# Patient Record
Sex: Female | Born: 1965
Health system: Southern US, Community
[De-identification: ages and names within clinical notes are randomized; demographics above are authoritative.]

## PROBLEM LIST (undated history)

## (undated) DIAGNOSIS — T8859XA Other complications of anesthesia, initial encounter: Secondary | ICD-10-CM

## (undated) DIAGNOSIS — E119 Type 2 diabetes mellitus without complications: Secondary | ICD-10-CM

## (undated) DIAGNOSIS — K219 Gastro-esophageal reflux disease without esophagitis: Secondary | ICD-10-CM

## (undated) DIAGNOSIS — E785 Hyperlipidemia, unspecified: Secondary | ICD-10-CM

## (undated) DIAGNOSIS — I10 Essential (primary) hypertension: Secondary | ICD-10-CM

## (undated) DIAGNOSIS — K3184 Gastroparesis: Secondary | ICD-10-CM

## (undated) DIAGNOSIS — E1010 Type 1 diabetes mellitus with ketoacidosis without coma: Secondary | ICD-10-CM

## (undated) DIAGNOSIS — R112 Nausea with vomiting, unspecified: Secondary | ICD-10-CM

## (undated) DIAGNOSIS — N179 Acute kidney failure, unspecified: Secondary | ICD-10-CM

## (undated) DIAGNOSIS — E1040 Type 1 diabetes mellitus with diabetic neuropathy, unspecified: Secondary | ICD-10-CM

## (undated) DIAGNOSIS — E1110 Type 2 diabetes mellitus with ketoacidosis without coma: Secondary | ICD-10-CM

## (undated) DIAGNOSIS — E538 Deficiency of other specified B group vitamins: Secondary | ICD-10-CM

## (undated) DIAGNOSIS — F419 Anxiety disorder, unspecified: Secondary | ICD-10-CM

## (undated) DIAGNOSIS — D509 Iron deficiency anemia, unspecified: Secondary | ICD-10-CM

## (undated) DIAGNOSIS — F322 Major depressive disorder, single episode, severe without psychotic features: Secondary | ICD-10-CM

## (undated) DIAGNOSIS — Z9889 Other specified postprocedural states: Secondary | ICD-10-CM

## (undated) DIAGNOSIS — G2401 Drug induced subacute dyskinesia: Secondary | ICD-10-CM

## (undated) DIAGNOSIS — I214 Non-ST elevation (NSTEMI) myocardial infarction: Secondary | ICD-10-CM

## (undated) DIAGNOSIS — E1143 Type 2 diabetes mellitus with diabetic autonomic (poly)neuropathy: Secondary | ICD-10-CM

## (undated) DIAGNOSIS — F329 Major depressive disorder, single episode, unspecified: Secondary | ICD-10-CM

## (undated) DIAGNOSIS — F32A Depression, unspecified: Secondary | ICD-10-CM

## (undated) DIAGNOSIS — I2510 Atherosclerotic heart disease of native coronary artery without angina pectoris: Secondary | ICD-10-CM

## (undated) DIAGNOSIS — Z951 Presence of aortocoronary bypass graft: Secondary | ICD-10-CM

## (undated) HISTORY — DX: Drug induced subacute dyskinesia: G24.01

## (undated) HISTORY — DX: Gastroparesis: K31.84

## (undated) HISTORY — PX: RETINAL DETACHMENT SURGERY: SHX105

## (undated) HISTORY — DX: Type 1 diabetes mellitus with ketoacidosis without coma: E10.10

## (undated) HISTORY — DX: Type 2 diabetes mellitus with diabetic autonomic (poly)neuropathy: E11.43

## (undated) HISTORY — PX: CARDIAC SURGERY: SHX584

## (undated) HISTORY — DX: Type 2 diabetes mellitus with ketoacidosis without coma: E11.10

## (undated) HISTORY — PX: EYE SURGERY: SHX253

## (undated) HISTORY — DX: Non-ST elevation (NSTEMI) myocardial infarction: I21.4

## (undated) HISTORY — DX: Major depressive disorder, single episode, severe without psychotic features: F32.2

## (undated) HISTORY — DX: Type 1 diabetes mellitus with diabetic neuropathy, unspecified: E10.40

## (undated) HISTORY — DX: Atherosclerotic heart disease of native coronary artery without angina pectoris: I25.10

## (undated) HISTORY — DX: Presence of aortocoronary bypass graft: Z95.1

## (undated) HISTORY — DX: Iron deficiency anemia, unspecified: D50.9

## (undated) HISTORY — DX: Acute kidney failure, unspecified: N17.9

## (undated) HISTORY — DX: Deficiency of other specified B group vitamins: E53.8

## (undated) HISTORY — PX: OTHER SURGICAL HISTORY: SHX169

---

## 1998-01-21 ENCOUNTER — Ambulatory Visit (HOSPITAL_COMMUNITY): Admission: RE | Admit: 1998-01-21 | Discharge: 1998-01-21 | Payer: Self-pay | Admitting: Obstetrics and Gynecology

## 1998-07-27 ENCOUNTER — Other Ambulatory Visit: Admission: RE | Admit: 1998-07-27 | Discharge: 1998-07-27 | Payer: Self-pay | Admitting: Obstetrics and Gynecology

## 1998-12-04 ENCOUNTER — Emergency Department (HOSPITAL_COMMUNITY): Admission: EM | Admit: 1998-12-04 | Discharge: 1998-12-04 | Payer: Self-pay | Admitting: Emergency Medicine

## 1999-05-08 ENCOUNTER — Other Ambulatory Visit: Admission: RE | Admit: 1999-05-08 | Discharge: 1999-05-08 | Payer: Self-pay | Admitting: Obstetrics and Gynecology

## 2000-01-08 ENCOUNTER — Encounter: Admission: RE | Admit: 2000-01-08 | Discharge: 2000-04-07 | Payer: Self-pay | Admitting: Internal Medicine

## 2000-04-01 ENCOUNTER — Other Ambulatory Visit: Admission: RE | Admit: 2000-04-01 | Discharge: 2000-04-01 | Payer: Self-pay | Admitting: Obstetrics and Gynecology

## 2000-05-13 ENCOUNTER — Encounter: Admission: RE | Admit: 2000-05-13 | Discharge: 2000-08-11 | Payer: Self-pay | Admitting: Internal Medicine

## 2001-03-26 ENCOUNTER — Encounter: Admission: RE | Admit: 2001-03-26 | Discharge: 2001-06-24 | Payer: Self-pay | Admitting: Internal Medicine

## 2001-08-13 ENCOUNTER — Other Ambulatory Visit: Admission: RE | Admit: 2001-08-13 | Discharge: 2001-08-13 | Payer: Self-pay | Admitting: Obstetrics and Gynecology

## 2001-08-19 ENCOUNTER — Encounter: Payer: Self-pay | Admitting: Obstetrics and Gynecology

## 2001-08-19 ENCOUNTER — Ambulatory Visit (HOSPITAL_COMMUNITY): Admission: RE | Admit: 2001-08-19 | Discharge: 2001-08-19 | Payer: Self-pay | Admitting: Obstetrics and Gynecology

## 2002-03-16 ENCOUNTER — Encounter: Payer: Self-pay | Admitting: Ophthalmology

## 2002-03-16 ENCOUNTER — Ambulatory Visit (HOSPITAL_COMMUNITY): Admission: RE | Admit: 2002-03-16 | Discharge: 2002-03-17 | Payer: Self-pay | Admitting: Ophthalmology

## 2002-03-16 HISTORY — PX: OTHER SURGICAL HISTORY: SHX169

## 2002-09-28 ENCOUNTER — Ambulatory Visit (HOSPITAL_COMMUNITY): Admission: RE | Admit: 2002-09-28 | Discharge: 2002-09-29 | Payer: Self-pay | Admitting: Ophthalmology

## 2002-09-28 HISTORY — PX: OTHER SURGICAL HISTORY: SHX169

## 2003-01-25 ENCOUNTER — Other Ambulatory Visit: Admission: RE | Admit: 2003-01-25 | Discharge: 2003-01-25 | Payer: Self-pay | Admitting: Obstetrics and Gynecology

## 2003-04-05 ENCOUNTER — Inpatient Hospital Stay (HOSPITAL_COMMUNITY): Admission: AD | Admit: 2003-04-05 | Discharge: 2003-04-08 | Payer: Self-pay | Admitting: Internal Medicine

## 2003-04-06 ENCOUNTER — Encounter: Payer: Self-pay | Admitting: Internal Medicine

## 2003-07-16 ENCOUNTER — Inpatient Hospital Stay (HOSPITAL_COMMUNITY): Admission: EM | Admit: 2003-07-16 | Discharge: 2003-07-19 | Payer: Self-pay

## 2004-01-21 IMAGING — CT CT ABDOMEN W/ CM
1 series · 1 of 1 positions shown · IV contrast (omnipaque)
Comparison: none

FINDINGS
CLINICAL DATA: LOWER ABDOMINAL PELVIC PAIN.  DIABETES AND URINARY TRACT INFECTION.  NAUSEA AND
VOMITING.   SUSPECT APPENDICITIS.
TECHNIQUE
MULTIDETECTOR HELICAL CT OF THE ABDOMEN AND PELVIS WAS PERFORMED DURING ADMINISTRATION OF 100 CC OF
OMNIPAQUE 300 INTRAVENOUS CONTRAST.  THE PATIENT WAS ONLY ABLE TO TOLERATE A SMALL AMOUNT OF ORAL
CONTRAST.  A RECTAL CONTRAST WAS ALSO ADMINISTERED.
ABDOMEN CT WITH CONTRAST
SURGICAL CLIPS ARE SEEN FROM PRIOR CHOLECYSTECTOMY.  THE LIVER, SPLEEN, PANCREAS, ADRENAL GLANDS
AND KIDNEYS ARE NORMAL IN APPEARANCE.
THERE IS NO EVIDENCE OF DILATED BOWEL LOOPS.  THERE IS NO EVIDENCE OF MASSES OR INFLAMMATORY
PROCESS.  THERE IS NO EVIDENCE OF ASCITES.
IMPRESSION
NEGATIVE ABDOMEN CT.
PELVIS CT WITH CONTRAST
RECTAL CONTRAST FILLS THE COLON  AND THE APPENDIX IS SEEN WHICH IS FILLED WITH AIR AND CONTRAST.
THERE IS NO EVIDENCE OF APPENDICITIS.  NO OTHER INFLAMMATORY PROCESS IS SEEN WITHIN THE PELVIS.
NO PELVIC MASSES ARE IDENTIFIED. THERE IS NO EVIDENCE OF FREE FLUID.  THE UTERINE FUNDUS HAS A
LOBULATED CONTOUR AND THE POSSIBILITY OF A FUNDAL UTERINE FIBROID CANNOT BE EXCLUDED.
NO EVIDENCE OF APPENDICITIS OR OTHER ACUTE INFLAMMATORY PROCESS.
QUESTION UTERINE FIBROID.  PELVIC ULTRSOUND COULD BE PERFORMED FOR FURTHER EVALUATION IF INDICATED.

[Series 1: — · 0.17mm/px · 1 of 1 slices shown]
[im 1/1  soft-tissue]
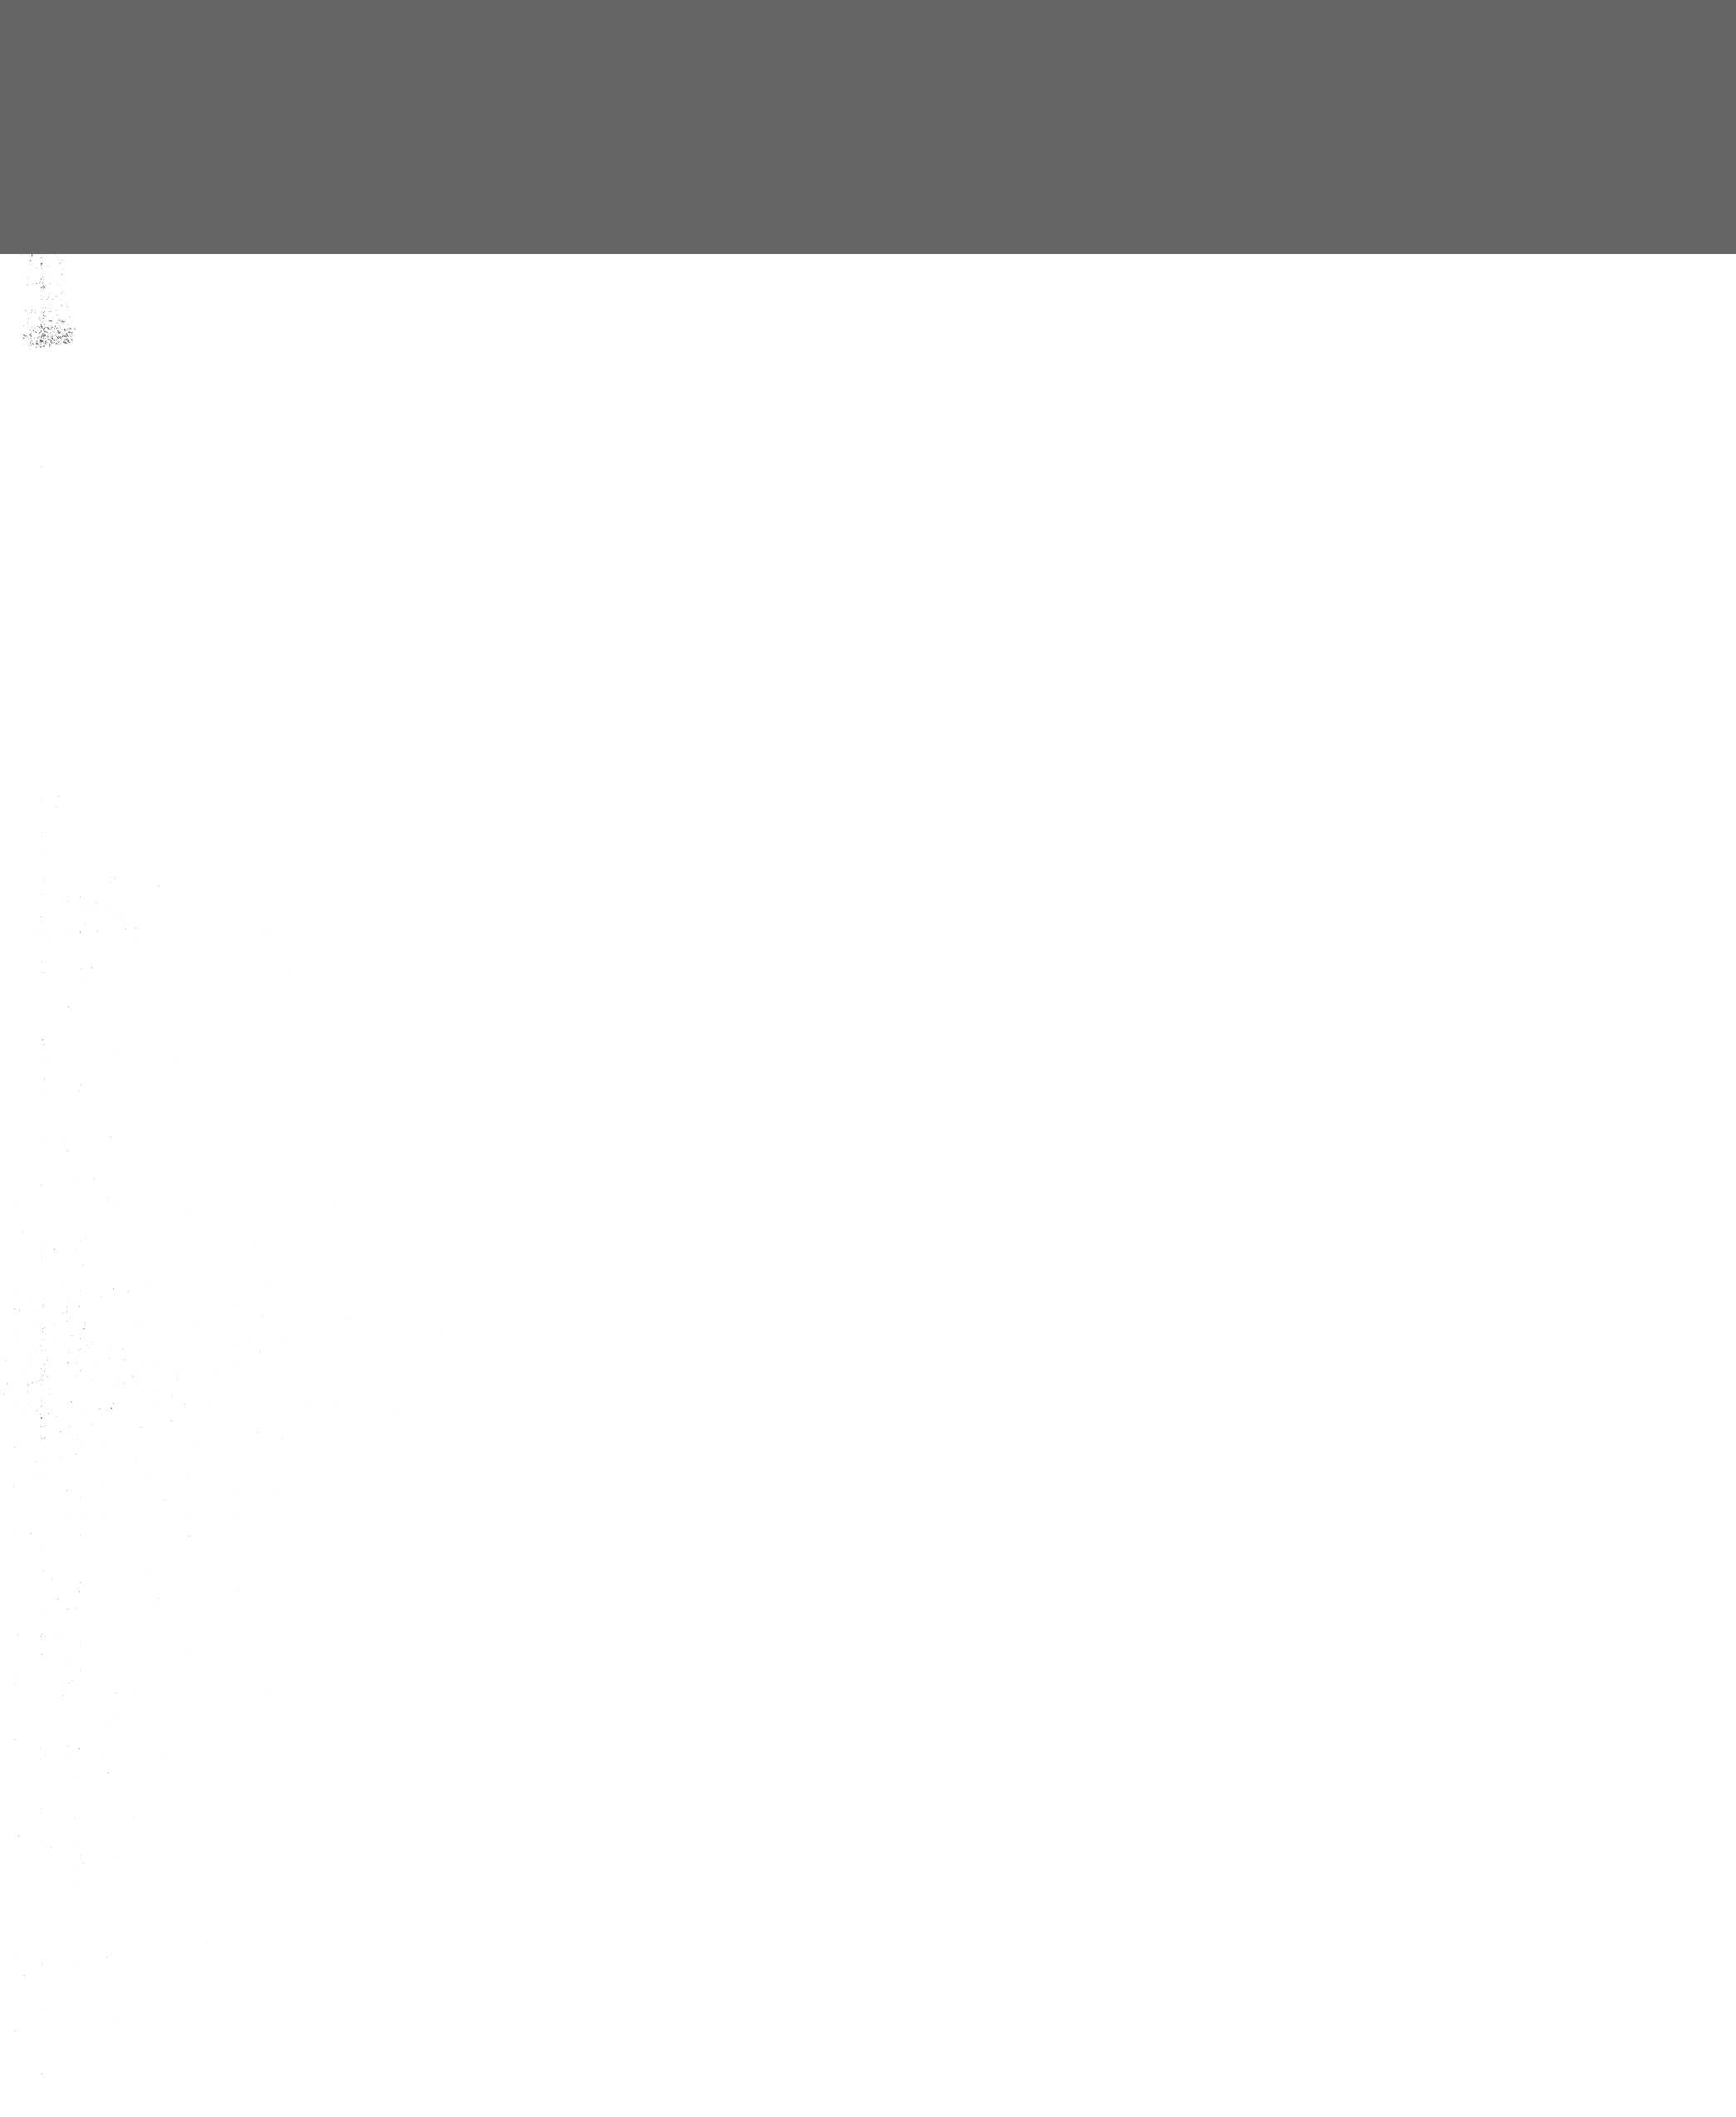

[1 of 1 positions shown; findings below may reference images not displayed]

## 2004-05-23 ENCOUNTER — Ambulatory Visit: Payer: Self-pay | Admitting: Internal Medicine

## 2004-06-05 ENCOUNTER — Ambulatory Visit: Payer: Self-pay | Admitting: Family Medicine

## 2004-06-06 ENCOUNTER — Ambulatory Visit: Payer: Self-pay | Admitting: *Deleted

## 2004-06-27 ENCOUNTER — Ambulatory Visit: Payer: Self-pay | Admitting: Family Medicine

## 2004-08-02 ENCOUNTER — Inpatient Hospital Stay (HOSPITAL_COMMUNITY): Admission: EM | Admit: 2004-08-02 | Discharge: 2004-08-05 | Payer: Self-pay | Admitting: *Deleted

## 2005-02-22 ENCOUNTER — Inpatient Hospital Stay (HOSPITAL_COMMUNITY): Admission: EM | Admit: 2005-02-22 | Discharge: 2005-02-27 | Payer: Self-pay | Admitting: Emergency Medicine

## 2005-05-19 IMAGING — CR DG ABDOMEN ACUTE W/ 1V CHEST
3 series · 3 of 3 positions shown · non-contrast
Comparison: None.

CLINICAL DATA: Mid abdominal pain with nausea and vomiting for two days.
 ACUTE ABDOMINAL SERIES WITH CHEST:

[view not recorded (1 of 3)]
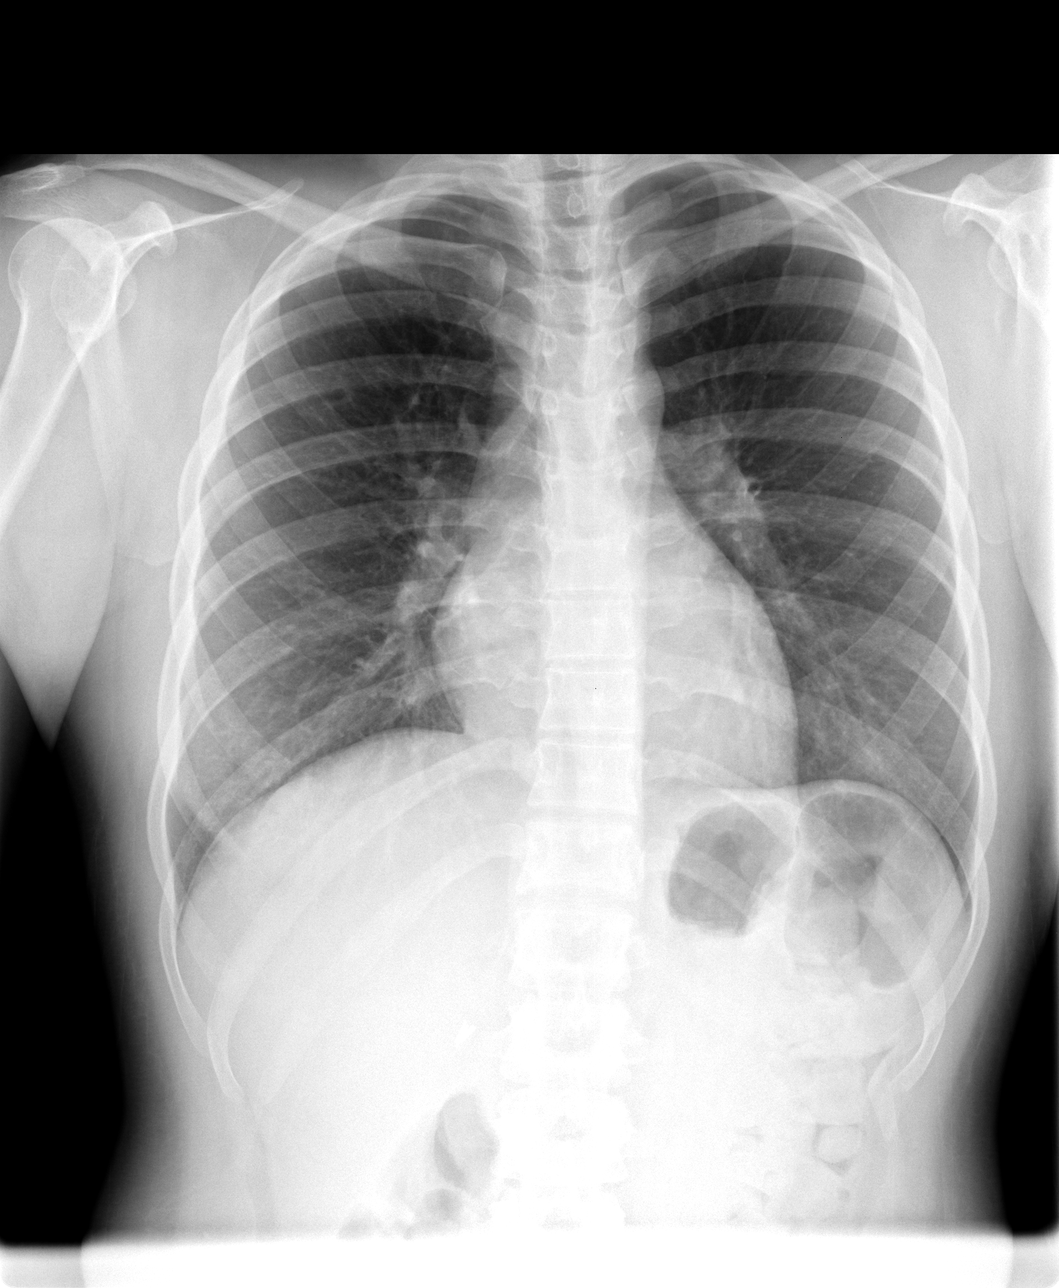

[view not recorded (2 of 3)]
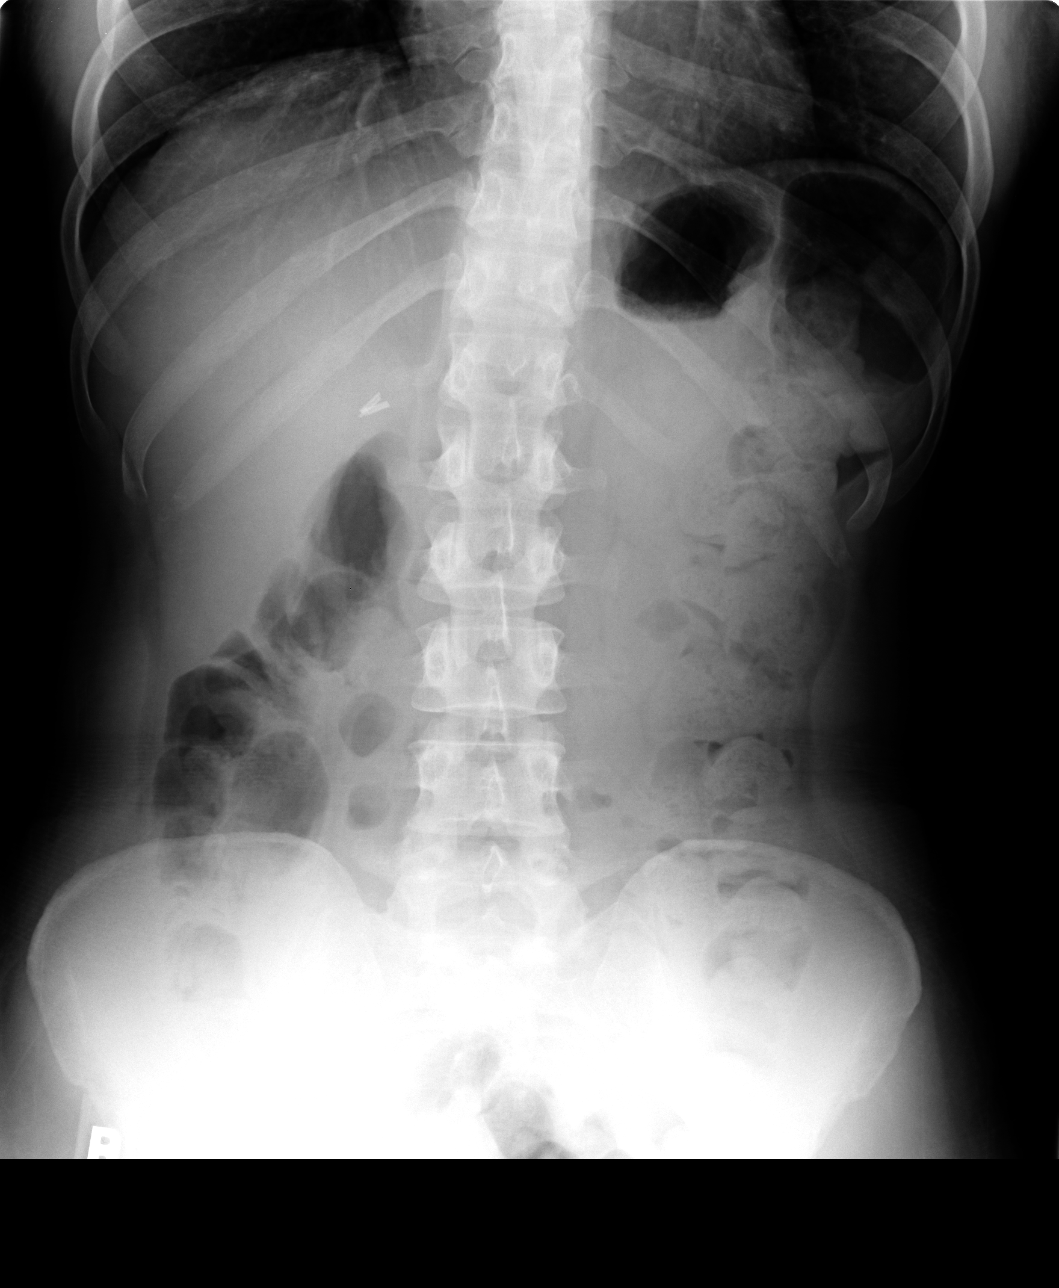

[view not recorded (3 of 3)]
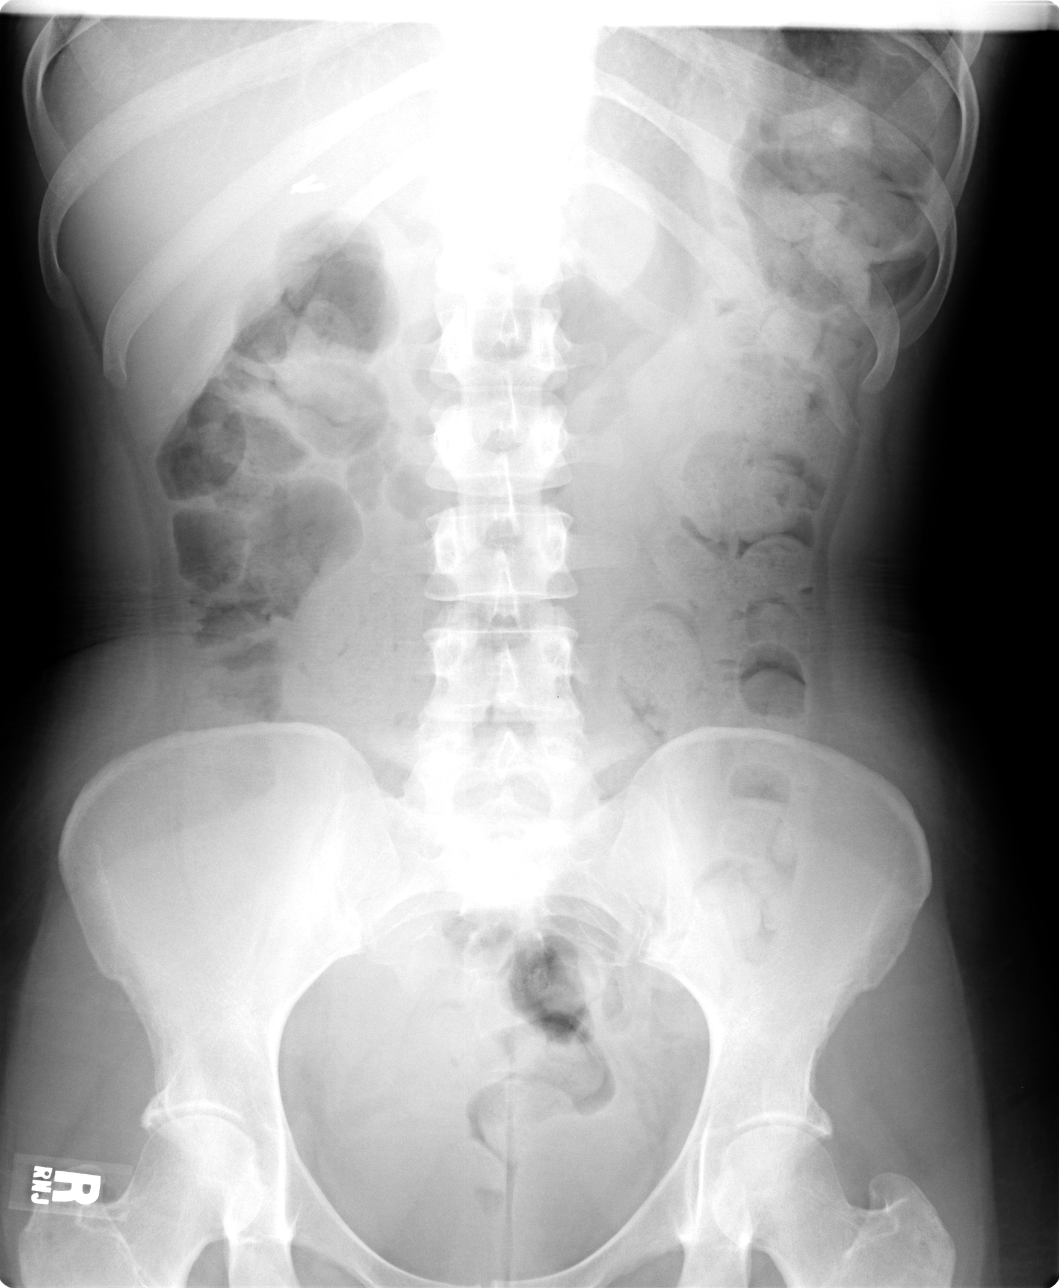

[3 of 3 positions shown; findings below may reference images not displayed]

PA chest demonstrates normal cardiomediastinal contours and clear lungs.  There is no pleural effusion or pneumoperitoneum. 
 Supine and erect views of the abdomen demonstrate a moderate amount of stool throughout the colon.  The bowel gas pattern is nonobstructive, and there is no free intraperitoneal air.  No suspicious abdominal calcifications are present.  There are right upper quadrant abdominal surgical clips compatible with previously cholecystectomy.
IMPRESSION: No active cardiopulmonary or abdominal process demonstrated.  Possible mild constipation.

## 2005-12-09 IMAGING — CR DG CHEST 1V PORT
1 series · 1 of 1 positions shown · non-contrast
Comparison: None.

CLINICAL DATA: Diabetic ketoacidosis.  PICC line placement. 
 PORTABLE SINGLE VIEW CHEST ([DATE] HOURS):

[view not recorded]
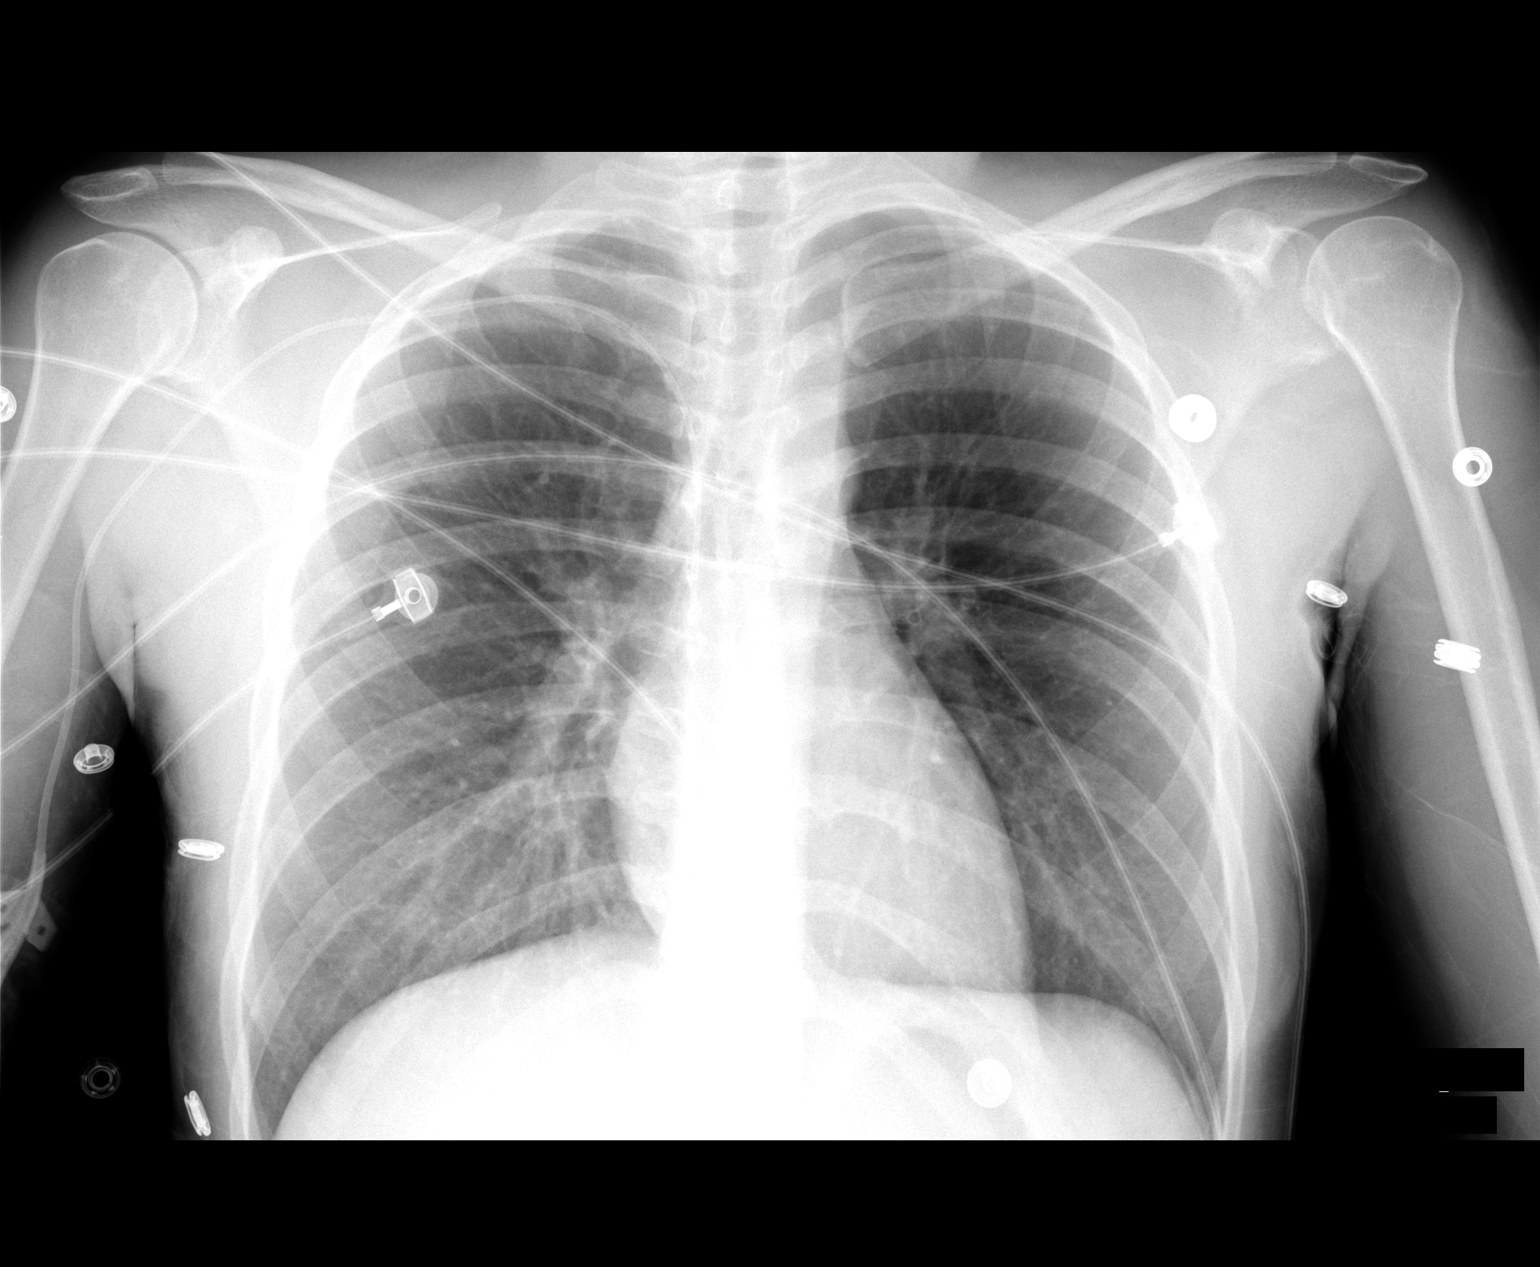

[1 of 1 positions shown; findings below may reference images not displayed]

FINDINGS: Right upper extremity PICC line has been placed.  The catheter tip lies in the lower SVC.  Lungs are clear.
IMPRESSION: PICC line tip lies in the lower SVC.

## 2005-12-10 IMAGING — US US ABDOMEN PORT
1 series · 14 of 25 positions shown · non-contrast
Comparison: None.

CLINICAL DATA: Diabetic he was stenosis. Prior cholecystectomy. History
diabetes. Abdominal pain.

COMPLETE ABDOMINAL ULTRASOUND  02/23/2005:

[Series 1: unknown · 0.30mm/px · 14 of 73 slices shown]
[im 1/73]
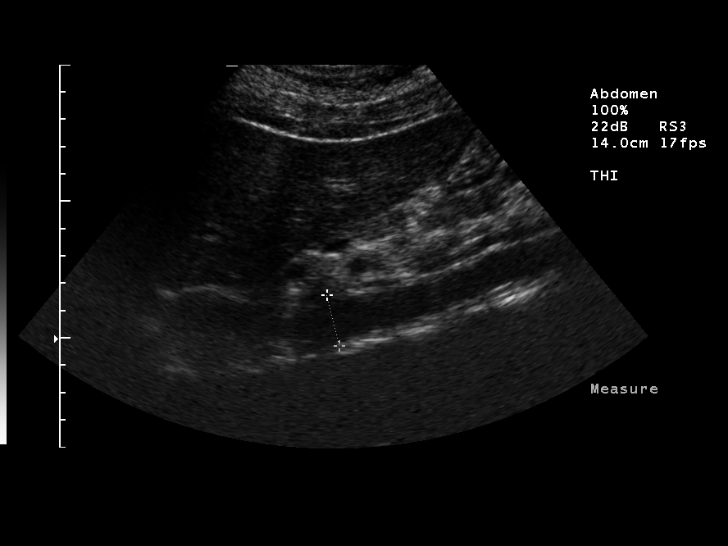
[im 7/73]
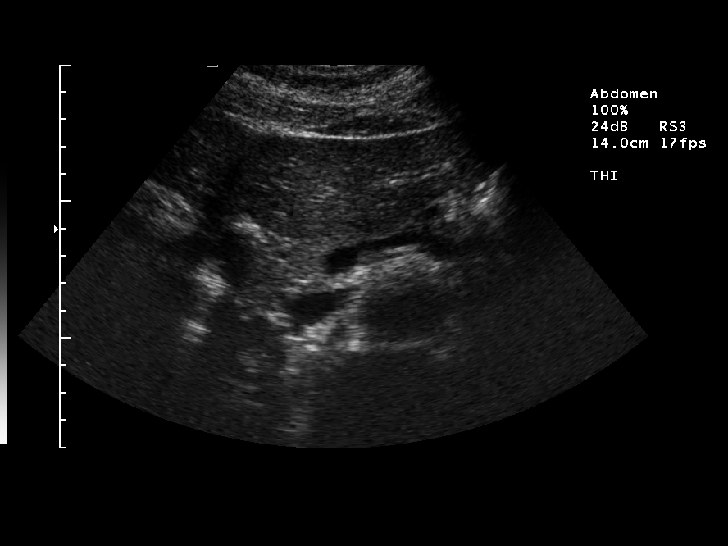
[im 13/73]
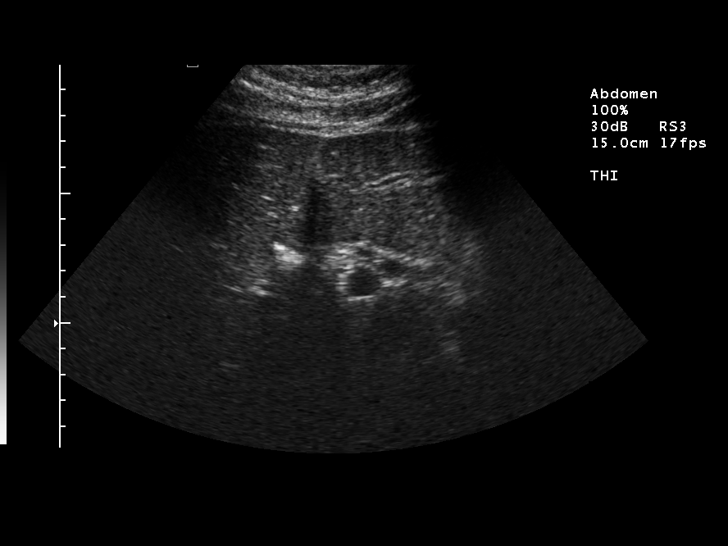
[im 19/73]
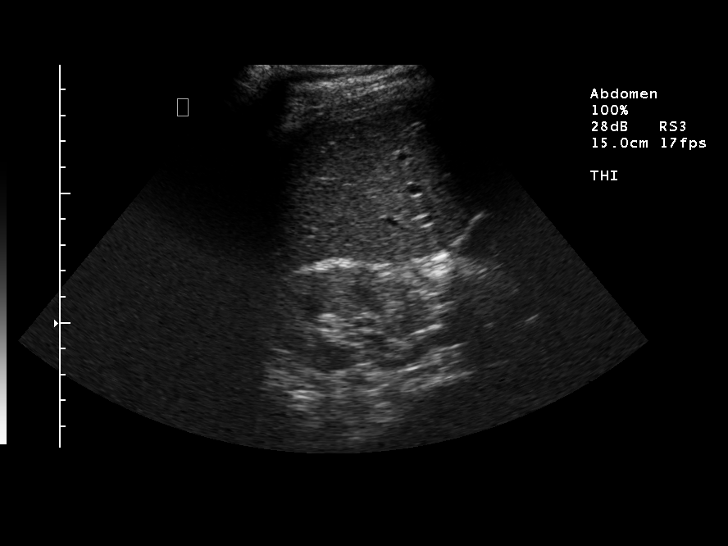
[im 25/73]
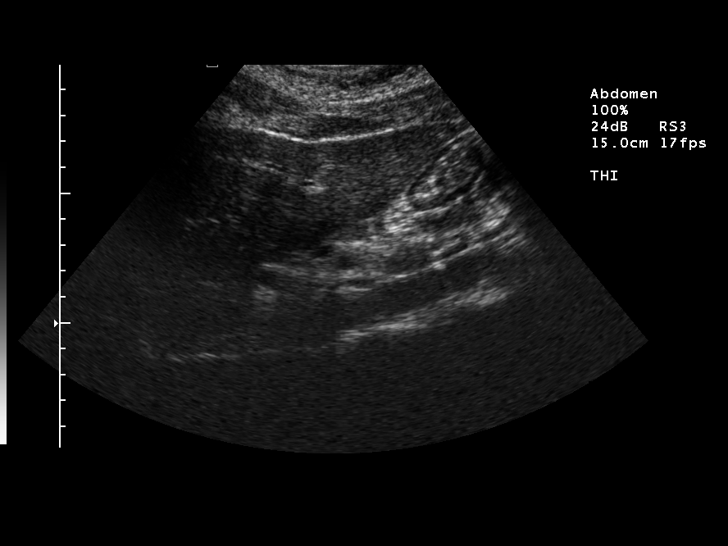
[im 28/73]
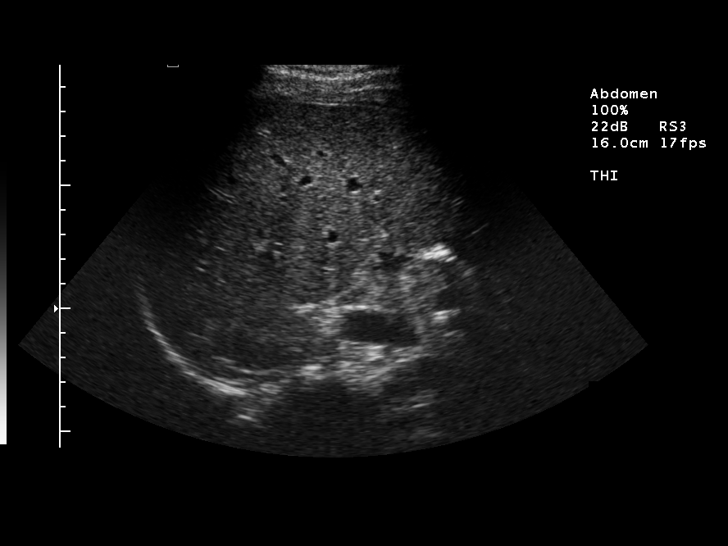
[im 34/73]
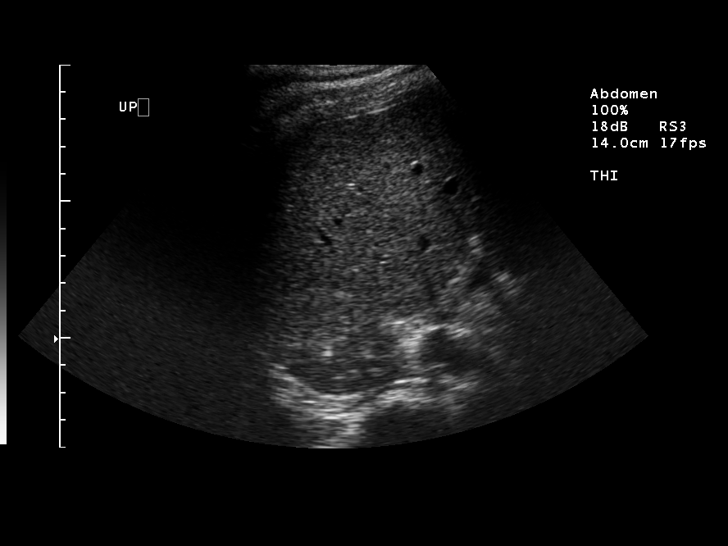
[im 40/73]
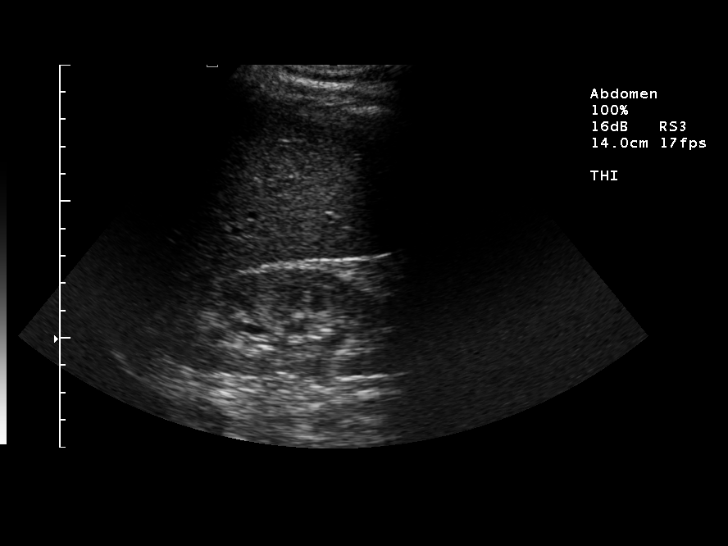
[im 46/73]
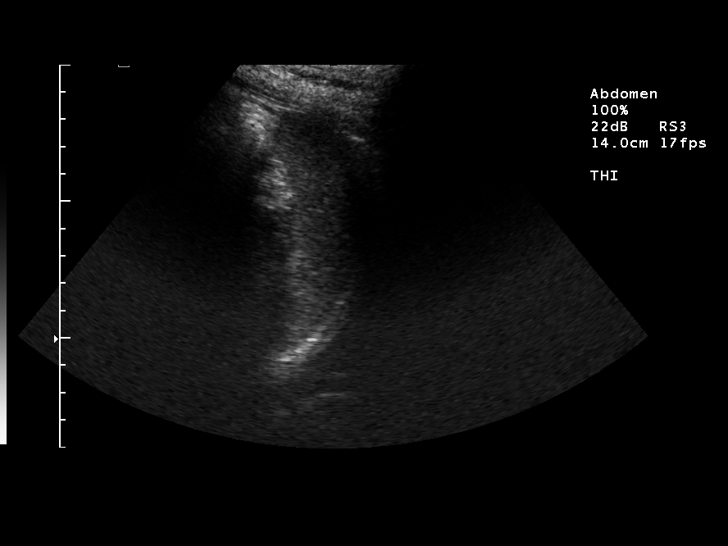
[im 49/73]
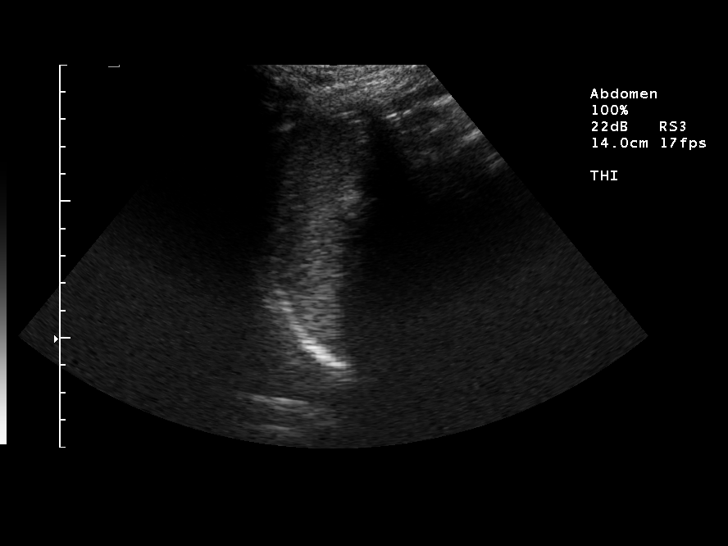
[im 55/73]
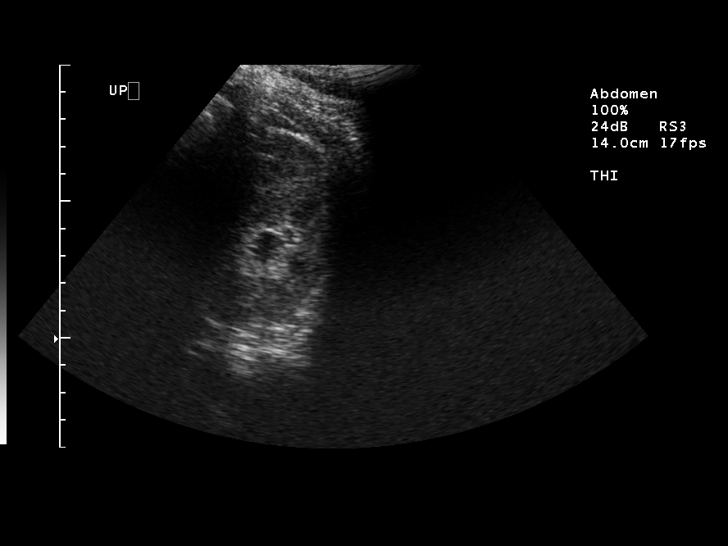
[im 61/73]
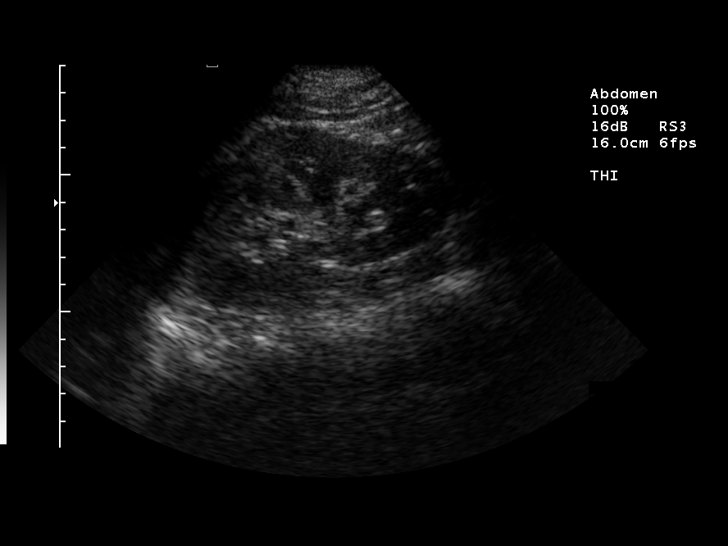
[im 67/73]
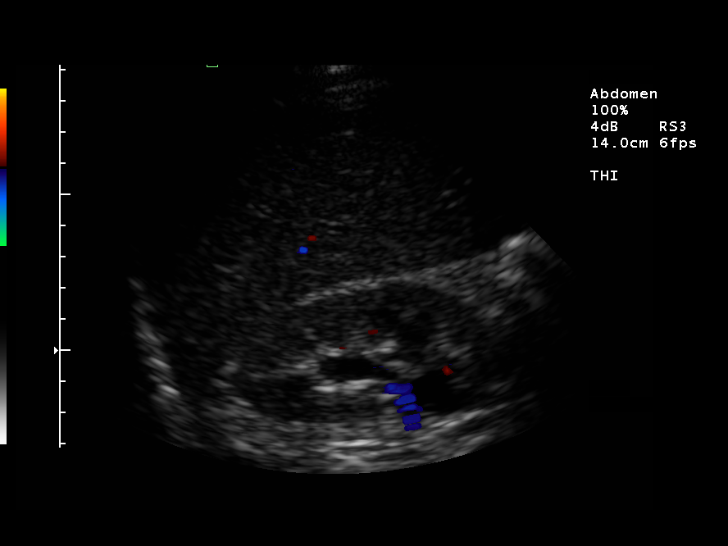
[im 73/73]
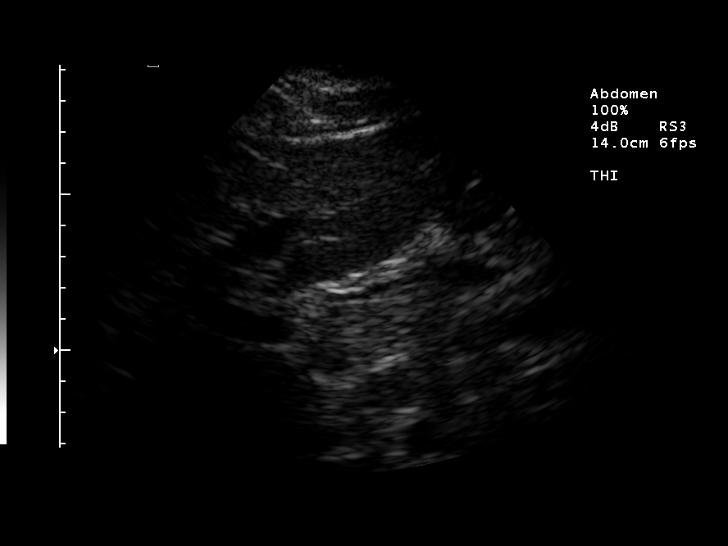

[14 of 25 positions shown; findings below may reference images not displayed]

FINDINGS: The examination was performed portably at the patient's bedside.

Gallbladder:  Surgically absent.

Common bile duct: Normal, 3 mm diameter.

Liver:  Normal.

Inferior vena cava:  Patent.

Pancreas:  Normal.

Spleen:  Normal, 7.1 cm.

Right kidney:  Mild fullness of the intrarenal collecting system without overt
hydronephrosis. No focal parenchymal abnormalities. 9.9 cm length.

Left kidney:  Normal, 10.2 cm.

Abdominal aorta:  Normal, maximum diameter 1.9 cm.
IMPRESSION: 1. No biliary ductal dilation, status post cholecystectomy.
2. Mild fullness of the right renal collecting system without overt
hydronephrosis.
3. Normal abdominal ultrasound otherwise.

## 2006-02-04 ENCOUNTER — Emergency Department (HOSPITAL_COMMUNITY): Admission: EM | Admit: 2006-02-04 | Discharge: 2006-02-04 | Payer: Self-pay | Admitting: Internal Medicine

## 2006-02-05 ENCOUNTER — Inpatient Hospital Stay (HOSPITAL_COMMUNITY): Admission: AD | Admit: 2006-02-05 | Discharge: 2006-02-09 | Payer: Self-pay | Admitting: Endocrinology

## 2006-04-01 ENCOUNTER — Other Ambulatory Visit: Admission: RE | Admit: 2006-04-01 | Discharge: 2006-04-01 | Payer: Self-pay | Admitting: Obstetrics and Gynecology

## 2006-11-21 IMAGING — CT CT PELVIS W/ CM
2 of 5 series · 17 of 46 positions shown, 19 images · IV contrast (ORAL OMNI 350 & 100 ML OMNI 300)
Comparison: None.

02/05/06 ? DUPLICATE COPY for exam association in [facility] ? No change from original report.
CLINICAL DATA: Abdominal pain with nausea and vomiting.  
 ABDOMEN CT WITH CONTRAST:
TECHNIQUE: Multidetector CT imaging of the abdomen was performed following the standard protocol during bolus administration of intravenous contrast.
 Contrast:  100 cc Omnipaque 300 and oral contrast.
TECHNIQUE: Multidetector CT imaging of the pelvis was performed following the standard protocol during bolus administration of intravenous contrast.

[Series 2: routine abdomen · axial · 0.66mm/px · z∈[-384,-9]mm · 14 of 85 slices shown, 16 images]
[im 5/85  soft-tissue]
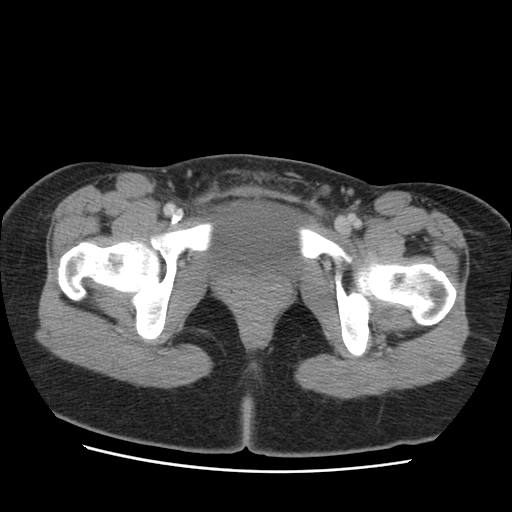
[im 5/85  bone]
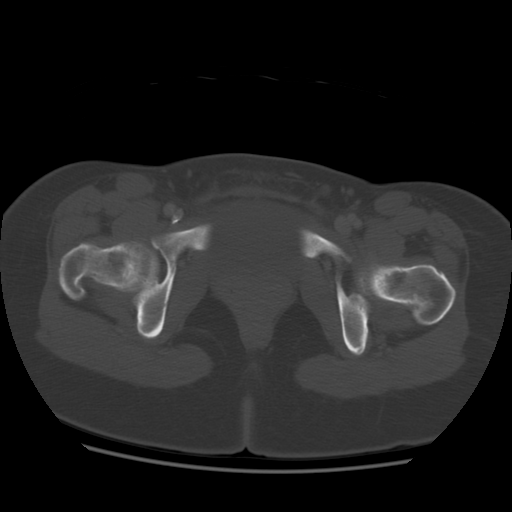
[im 13/85  soft-tissue]
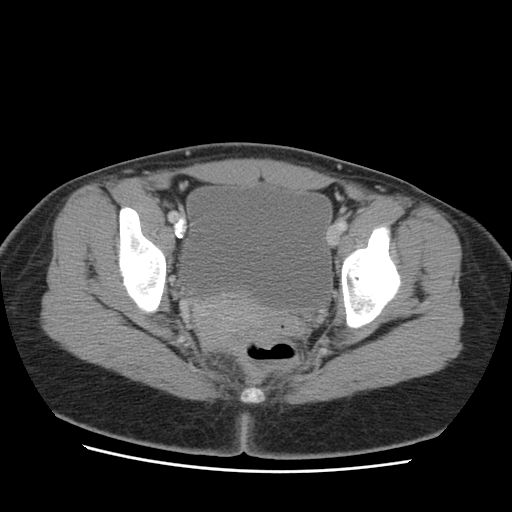
[im 17/85  soft-tissue]
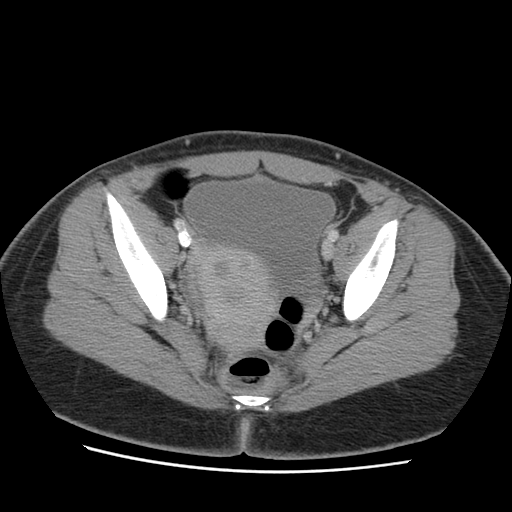
[im 22/85  soft-tissue]
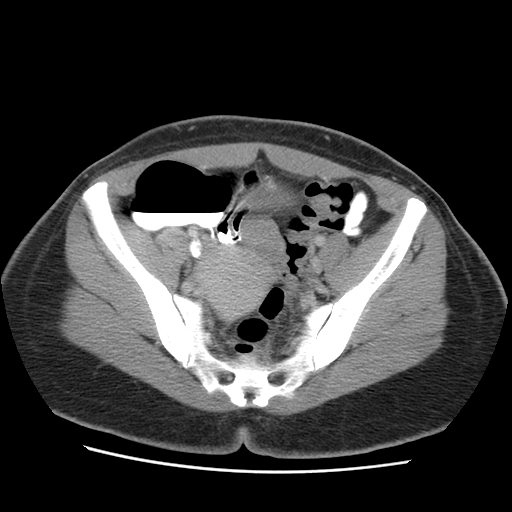
[im 30/85  soft-tissue]
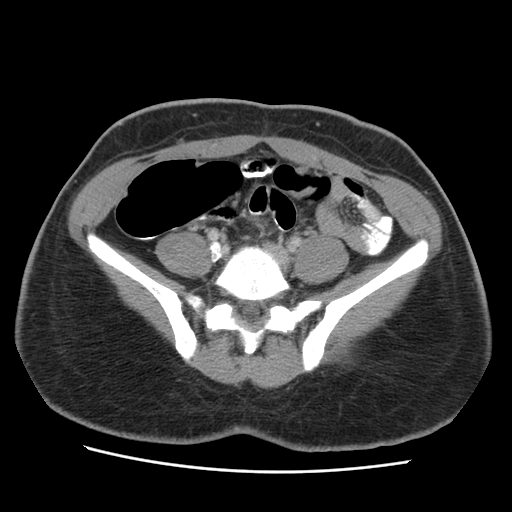
[im 34/85  soft-tissue]
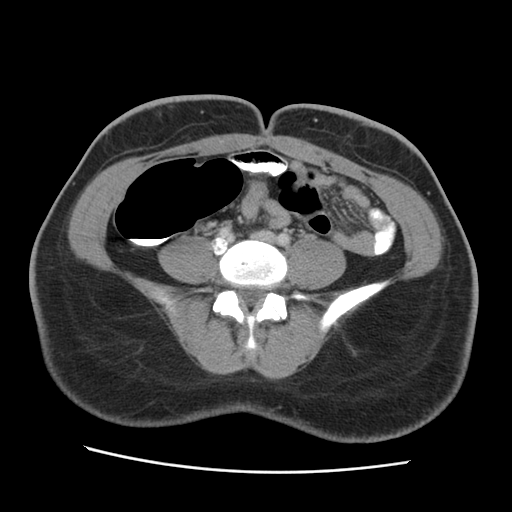
[im 38/85  soft-tissue]
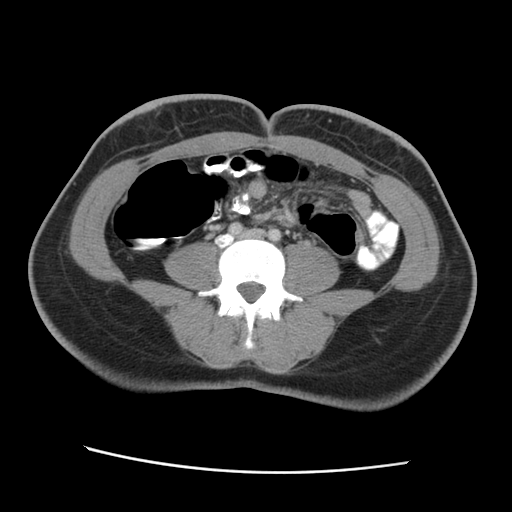
[im 47/85  soft-tissue]
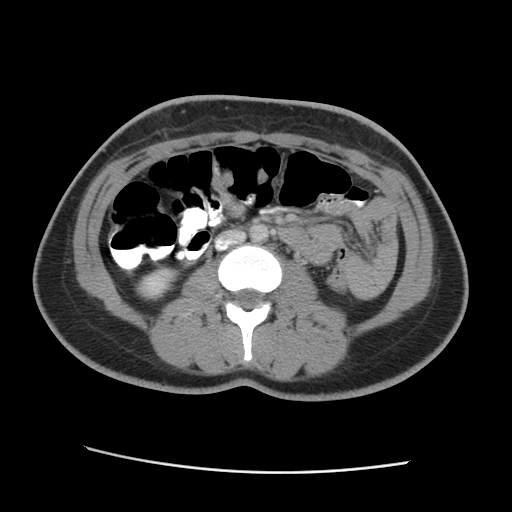
[im 51/85  soft-tissue]
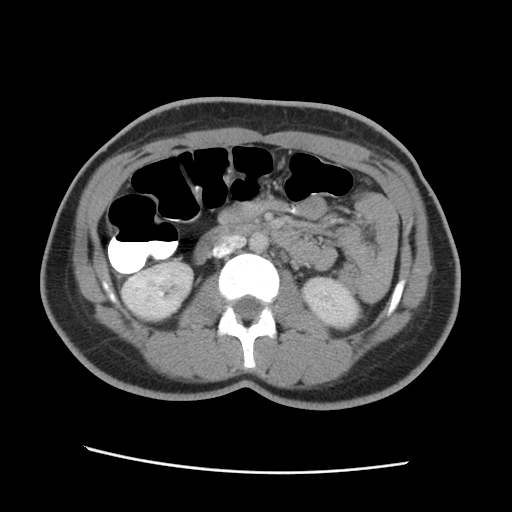
[im 51/85  bone]
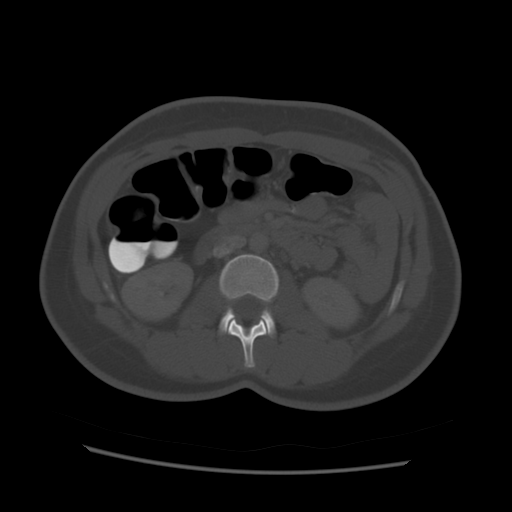
[im 55/85  soft-tissue]
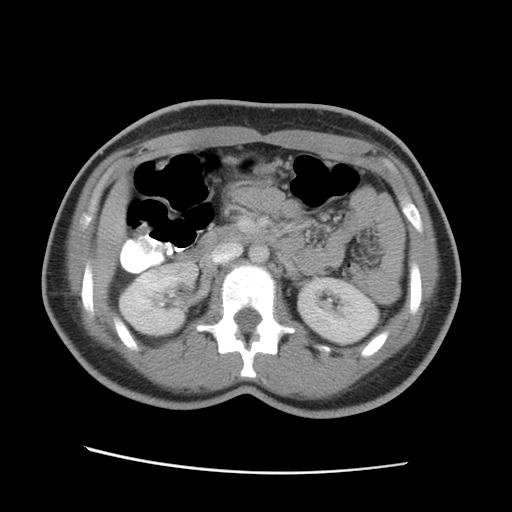
[im 64/85  soft-tissue]
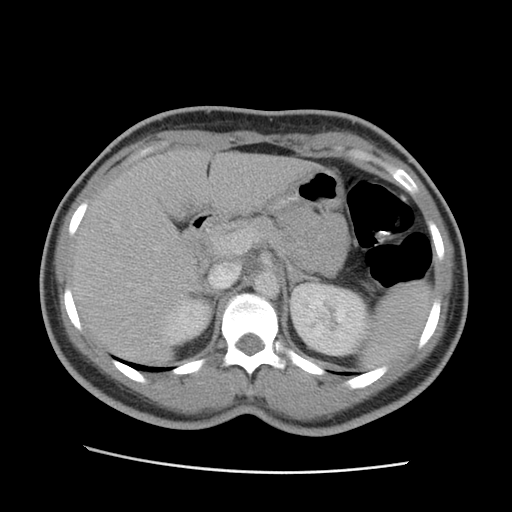
[im 68/85  soft-tissue]
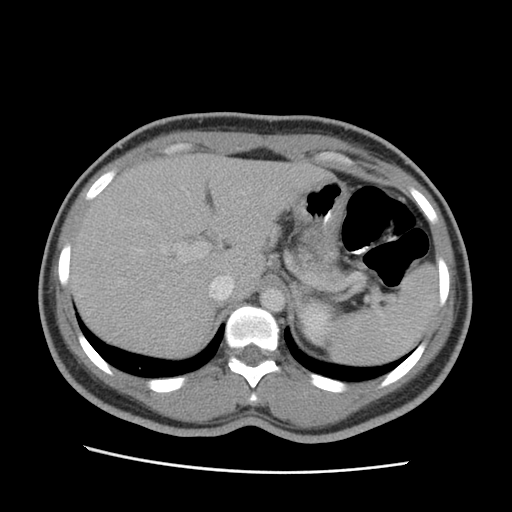
[im 72/85  soft-tissue]
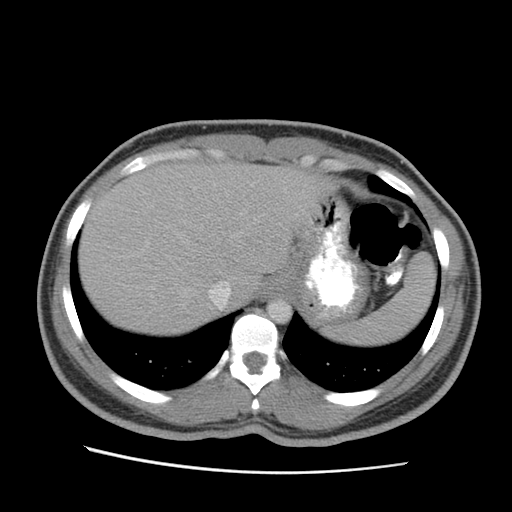
[im 80/85  soft-tissue]
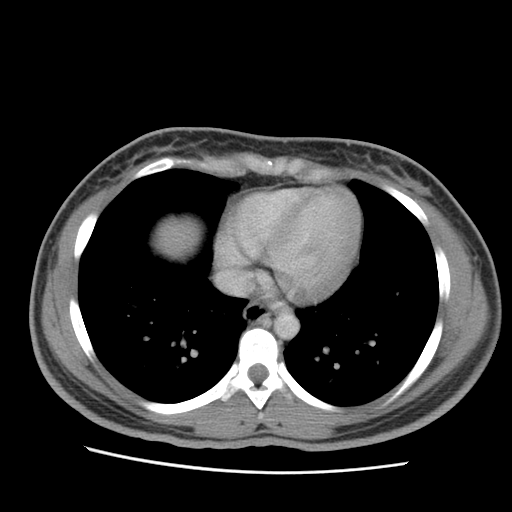

[Series 401: reformatted · coronal · 0.86mm/px · 3 of 99 slices shown]
[im 33/99  soft-tissue]
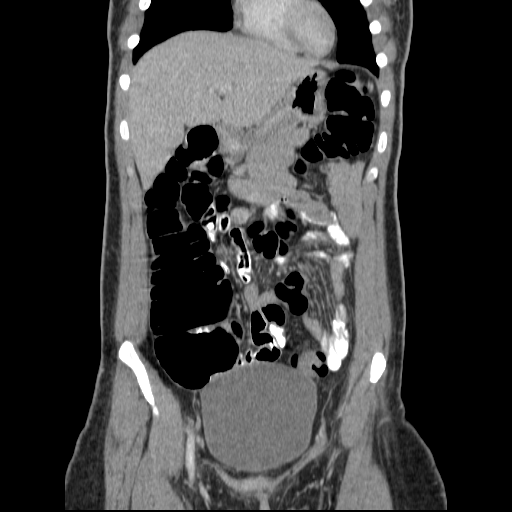
[im 44/99  soft-tissue]
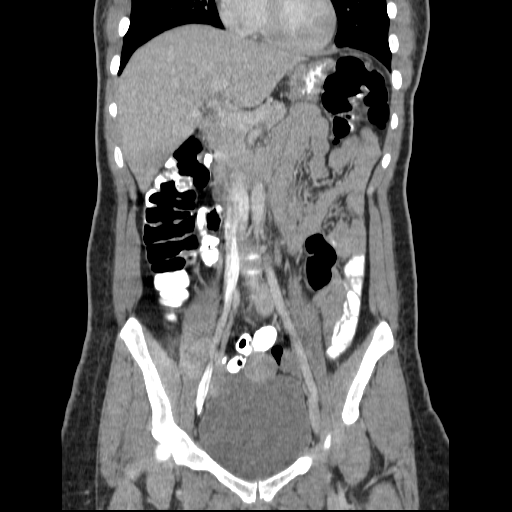
[im 55/99  soft-tissue]
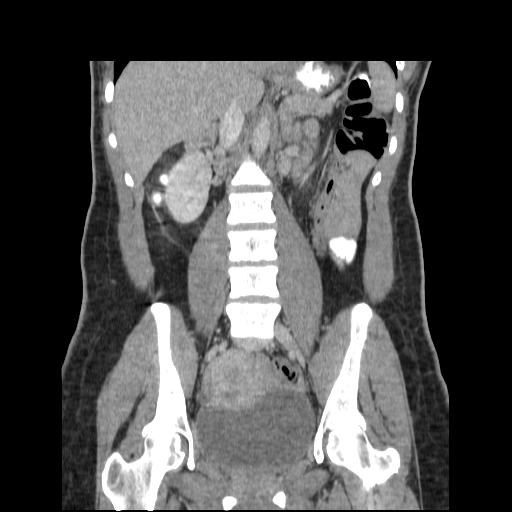

[17 of 46 positions shown; findings below may reference images not displayed]

FINDINGS: Lung bases are clear.  No focal lesions of the liver or spleen.  Status post cholecystectomy.  No bile duct dilatation.  Kidneys, pancreas, and adrenal glands normal.  No adenopathy or ascites.  
 Visualized bowel normal.
IMPRESSION: Status post cholecystectomy--No pathological findings.  
 PELVIS CT WITH CONTRAST:
FINDINGS: There is a pedunculated fibroid emanating off the anterior dome of the uterus.  No other masses, adenopathy, or fluid collections.  Appendix normal.  Visualized bowel normal.
IMPRESSION: Normal except for a pedunculated uterine fibroid.

## 2006-11-22 IMAGING — CR DG ABDOMEN 2V
2 series · 2 of 2 positions shown · non-contrast
Comparison: CT scan of 02/04/06.

CLINICAL DATA: 39 year old with abdominal pain and vomiting for three days.
 ABDOMEN ? 2 VIEW ? 02/05/06:

[w abdomen upright]
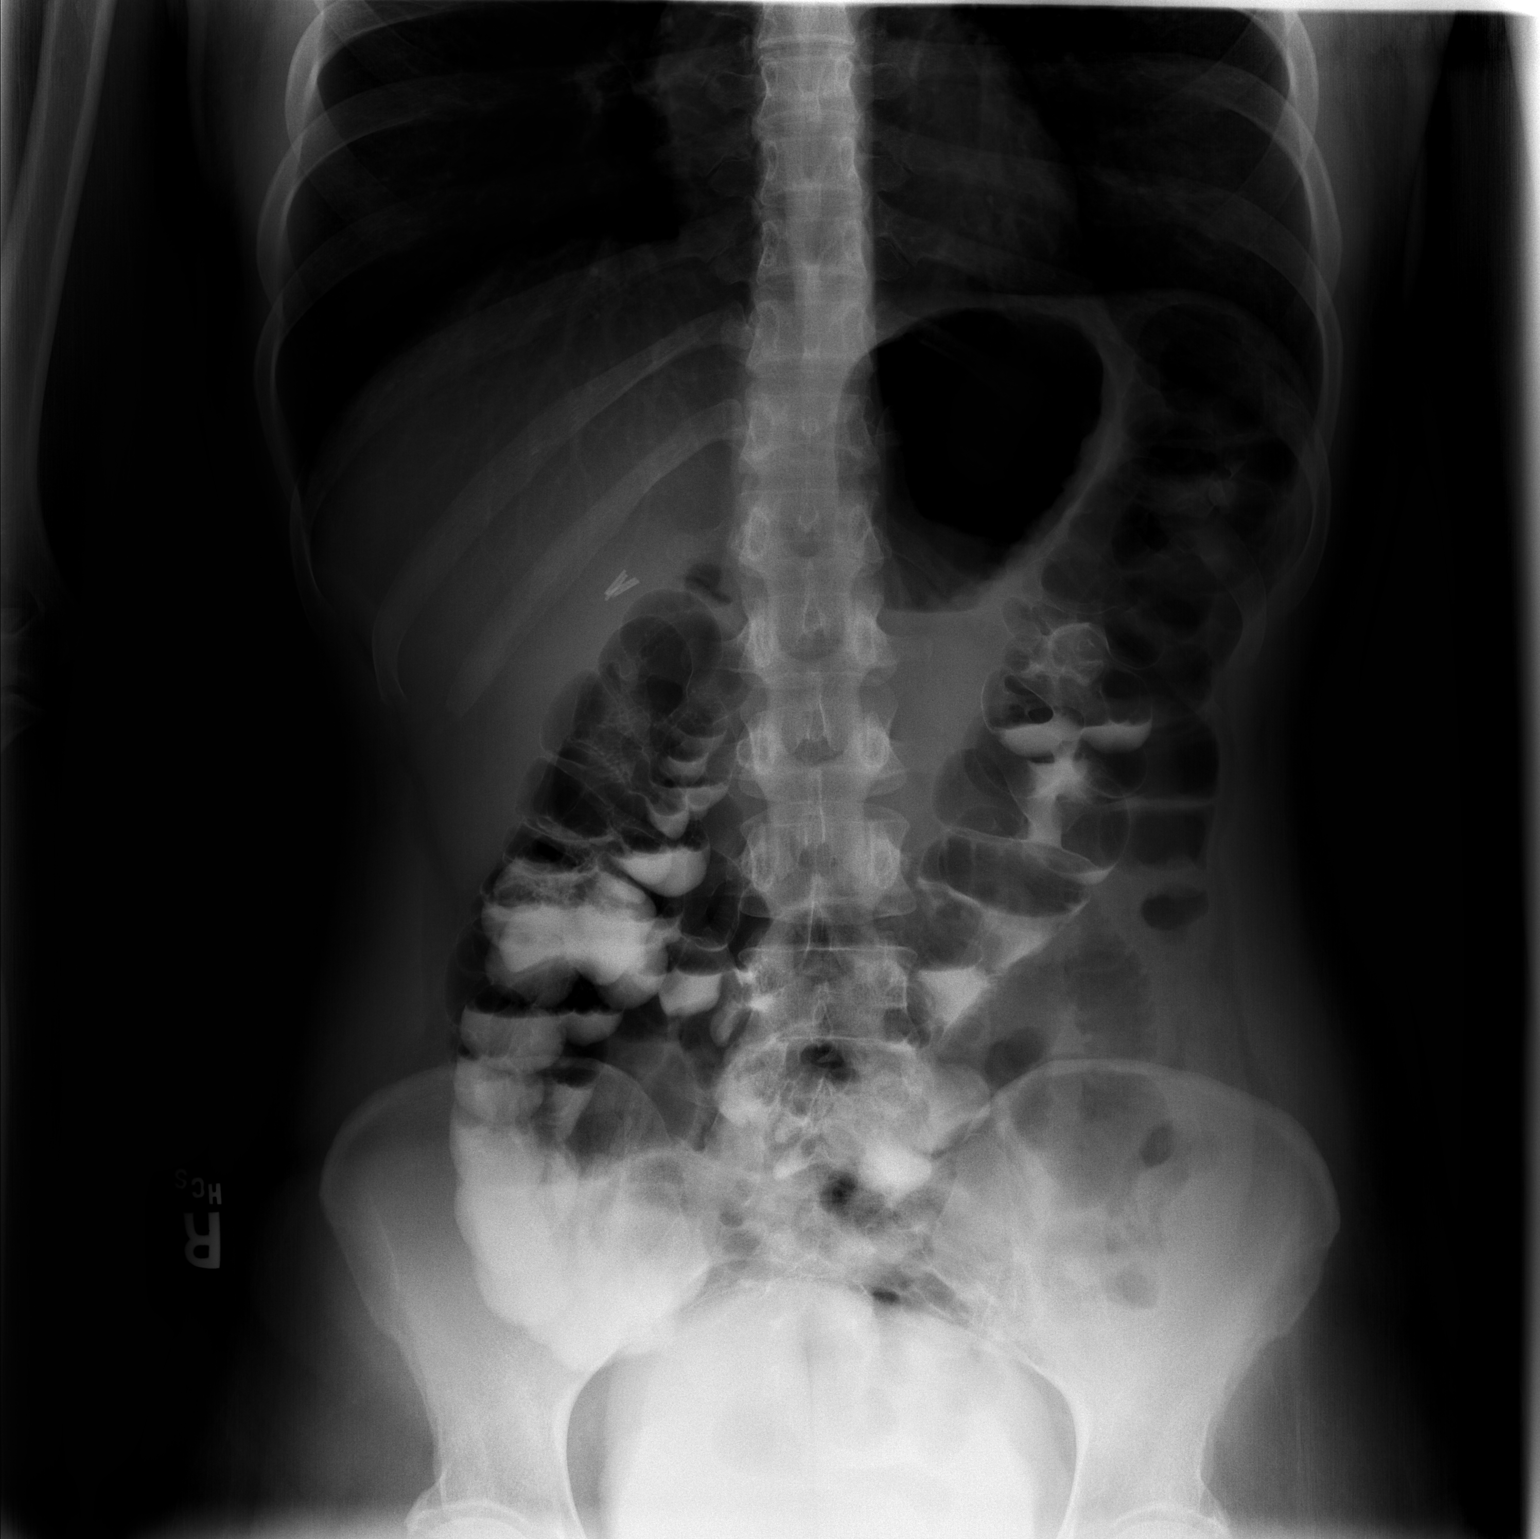

[t abdomen supine]
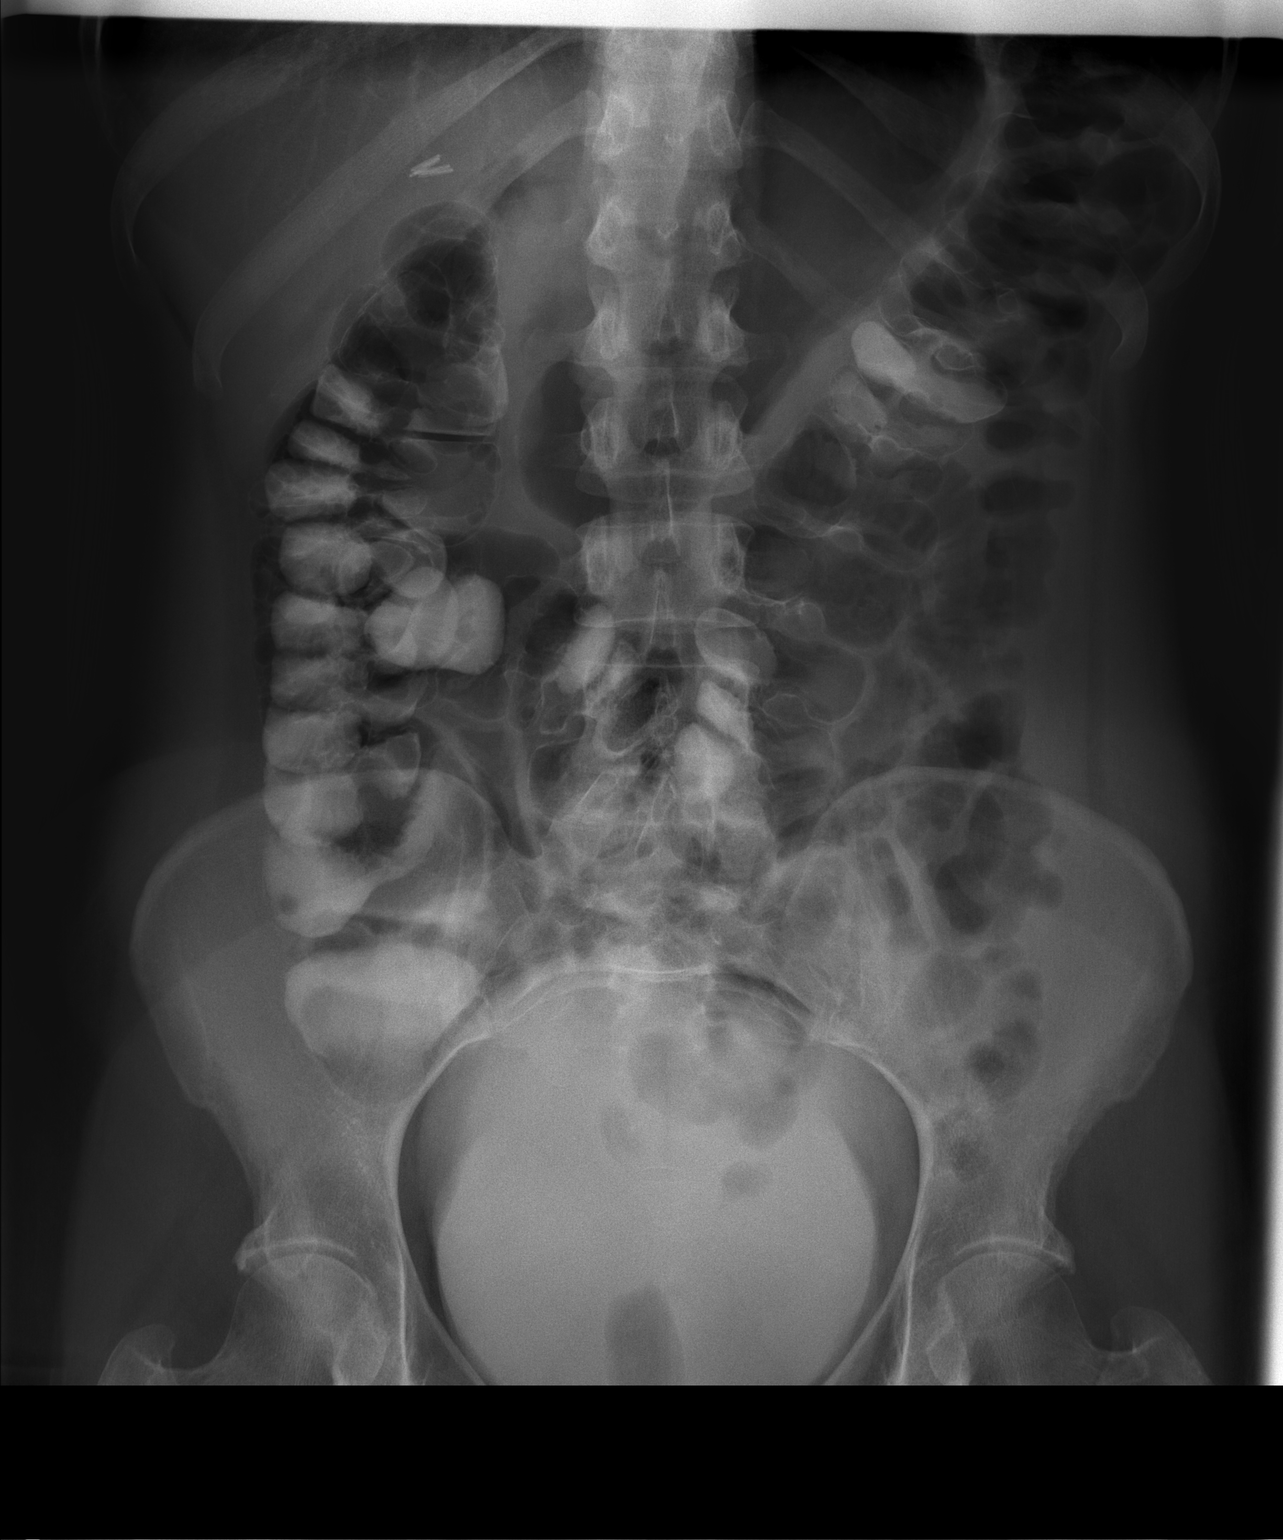

[2 of 2 positions shown; findings below may reference images not displayed]

FINDINGS: There is scattered contrast throughout the colon.  There are scattered loops of small bowel with air but no distention or air-fluid levels to suggest small bowel obstruction.  The bladder is distended with contrast.  No free air.  The lung bases appear clear.  Surgical clips are noted in the right upper quadrant from previous cholecystectomy.  The soft tissue shadows in the abdomen are grossly maintained.  No significant bony findings.
IMPRESSION: No plain film evidence for small bowel obstruction or perforation.  There is scattered air and contrast in the colon and the bladder is also filled with contrast.

## 2006-11-23 IMAGING — CR DG CHEST 1V PORT
1 series · 1 of 1 positions shown · non-contrast
Comparison: 02/22/05.

CLINICAL DATA: Right-sided PICC line placement.  
 PORTABLE CHEST - 1 VIEW 02/06/06:

[view not recorded]
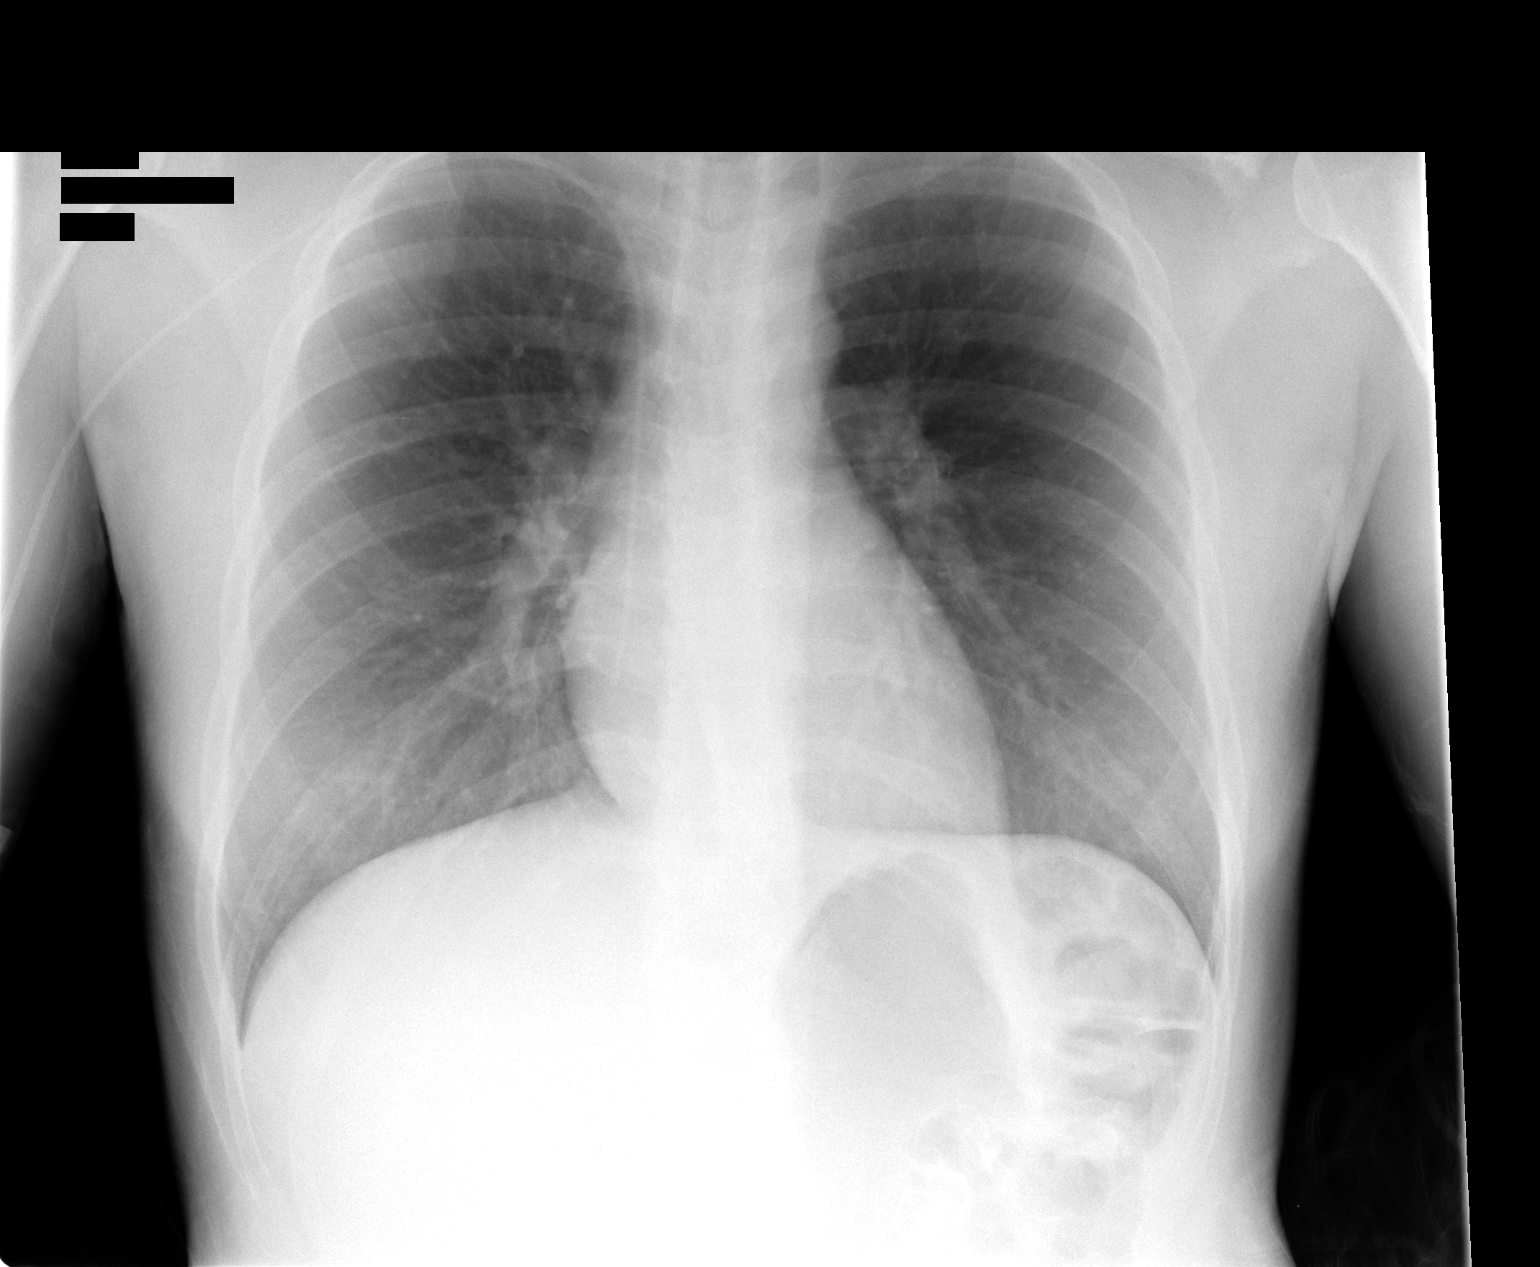

[1 of 1 positions shown; findings below may reference images not displayed]

FINDINGS: Right-sided PICC line terminates over the right atrium and could be withdrawn 2 cm if cavoatrial positioning is desired.  Heart size is normal and the lungs are clear.  No pneumothorax.
IMPRESSION: Right-sided PICC line tip over right atrium.

## 2007-04-29 ENCOUNTER — Emergency Department (HOSPITAL_COMMUNITY): Admission: EM | Admit: 2007-04-29 | Discharge: 2007-04-29 | Payer: Self-pay | Admitting: Emergency Medicine

## 2007-08-08 ENCOUNTER — Ambulatory Visit: Payer: Self-pay | Admitting: Endocrinology

## 2007-09-27 ENCOUNTER — Emergency Department (HOSPITAL_COMMUNITY): Admission: EM | Admit: 2007-09-27 | Discharge: 2007-09-27 | Payer: Self-pay | Admitting: Emergency Medicine

## 2007-10-12 ENCOUNTER — Emergency Department (HOSPITAL_COMMUNITY): Admission: EM | Admit: 2007-10-12 | Discharge: 2007-10-13 | Payer: Self-pay | Admitting: Emergency Medicine

## 2007-12-22 ENCOUNTER — Encounter: Payer: Self-pay | Admitting: Endocrinology

## 2008-06-09 ENCOUNTER — Inpatient Hospital Stay (HOSPITAL_COMMUNITY): Admission: EM | Admit: 2008-06-09 | Discharge: 2008-06-14 | Payer: Self-pay | Admitting: Emergency Medicine

## 2009-01-24 ENCOUNTER — Observation Stay (HOSPITAL_COMMUNITY): Admission: EM | Admit: 2009-01-24 | Discharge: 2009-01-24 | Payer: Self-pay | Admitting: Emergency Medicine

## 2009-02-19 ENCOUNTER — Emergency Department (HOSPITAL_COMMUNITY): Admission: EM | Admit: 2009-02-19 | Discharge: 2009-02-19 | Payer: Self-pay | Admitting: Emergency Medicine

## 2009-03-27 IMAGING — CR DG CHEST 1V PORT
1 series · 1 of 1 positions shown · non-contrast
Comparison: 02/06/2006.

CLINICAL DATA: PICC line placement.

PORTABLE CHEST - 1 VIEW

[view not recorded]
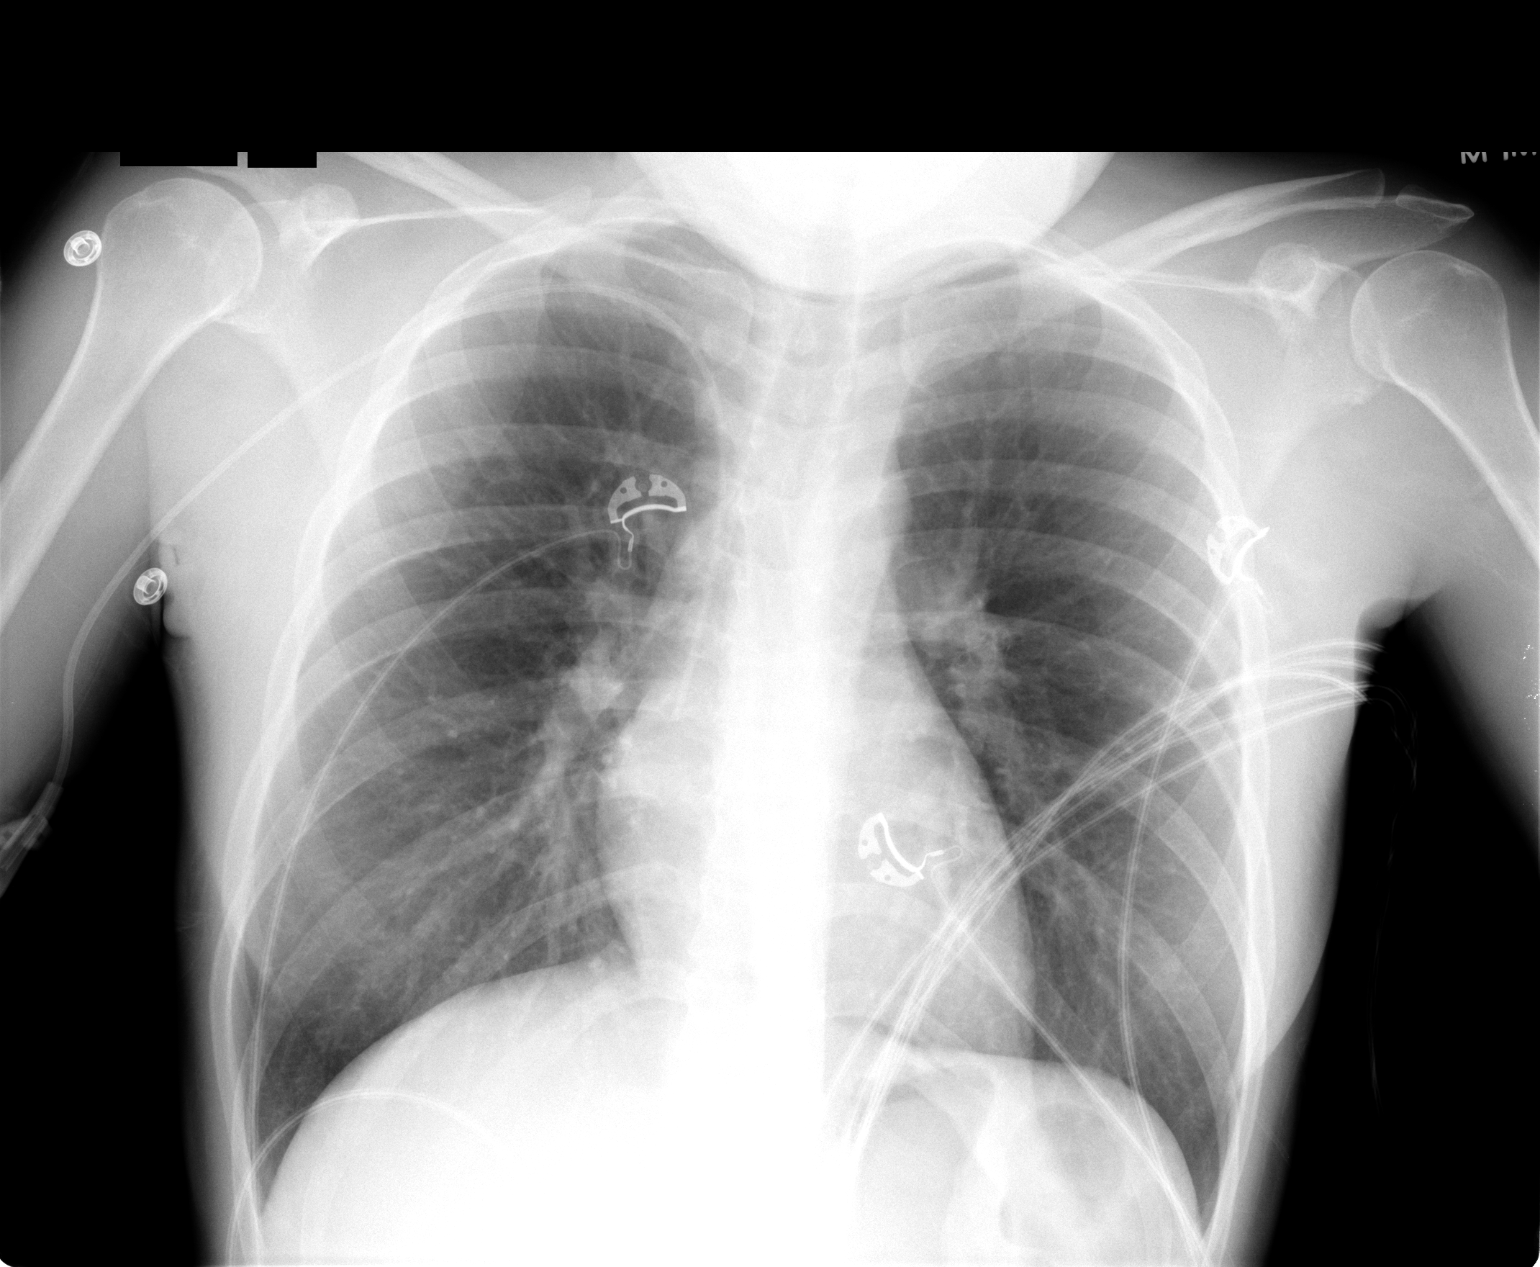

[1 of 1 positions shown; findings below may reference images not displayed]

FINDINGS: Right arm PICC line tip is at the cavoatrial junction.
The lungs are clear and  there is no infiltrate or effusion.
IMPRESSION: PICC line is at the cavoatrial junction.

No active cardiopulmonary disease.

## 2009-03-27 IMAGING — CT CT ABDOMEN W/ CM
1 series · 15 of 29 positions shown, 19 images · IV contrast (agent unspecified)
Comparison: 02/04/2006

CT ABDOMEN

CLINICAL DATA: Diabetic ketoacidosis, nausea, vomiting, diarrhea,
fever, question appendicitis

CT ABDOMEN AND PELVIS WITH CONTRAST
TECHNIQUE: Multidetector CT imaging of the abdomen and pelvis was
performed using the standard protocol following bolus
administration of intravenous contrast. Sagittal and coronal MPR
images reconstructed from axial data set.
Contrast: Dilute oral contrast and 100 ml [id number]

[Series 4: abd_pel 5.0 b60f lung · axial · 0.61mm/px · z∈[-181,-56]mm · 15 of 29 slices shown, 19 images]
[im 3/29  soft-tissue]
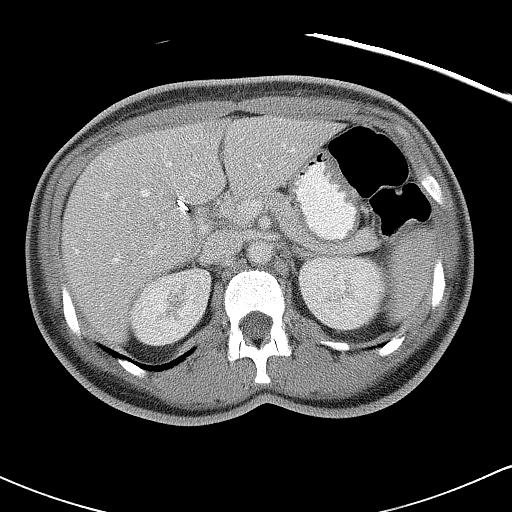
[im 3/29  bone]
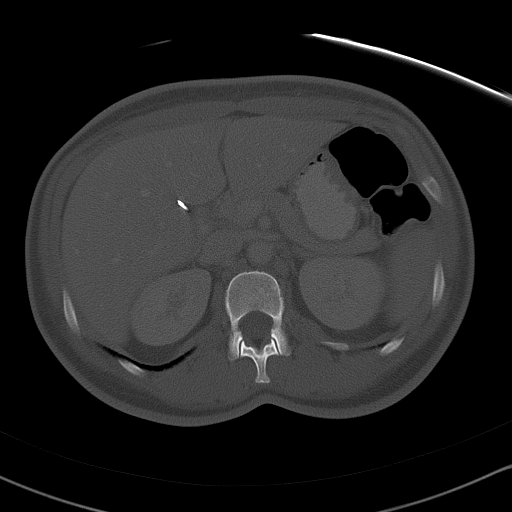
[im 5/29  soft-tissue]
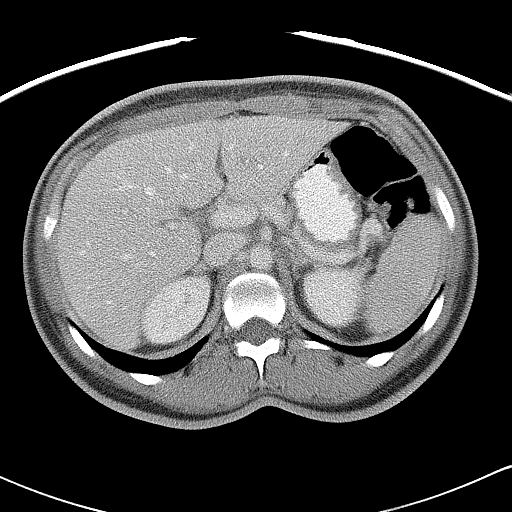
[im 7/29  soft-tissue]
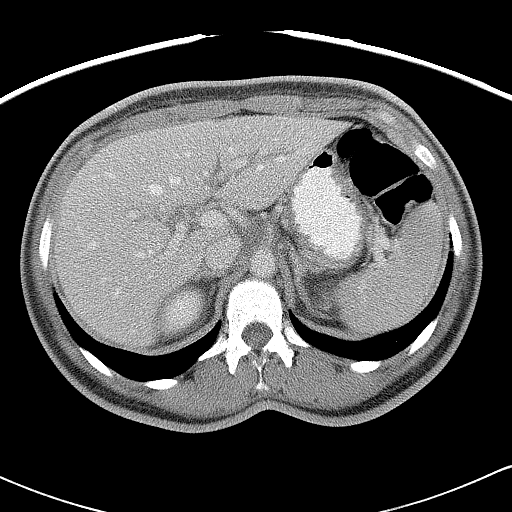
[im 9/29  soft-tissue]
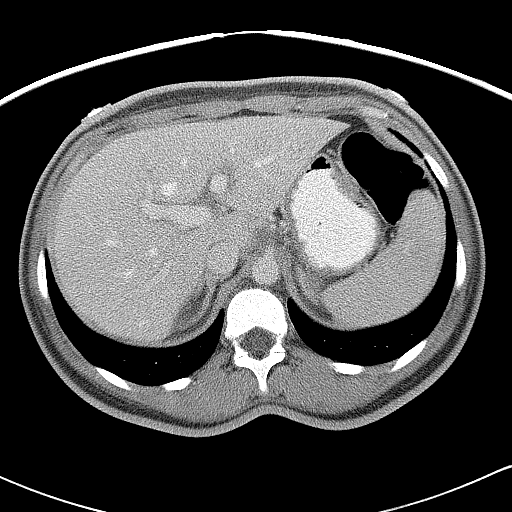
[im 11/29  soft-tissue]
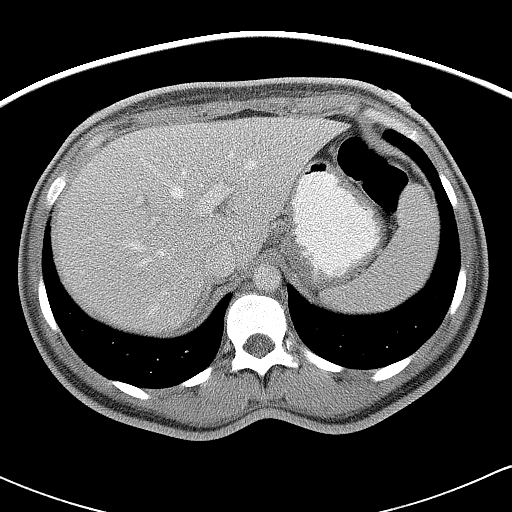
[im 13/29  soft-tissue]
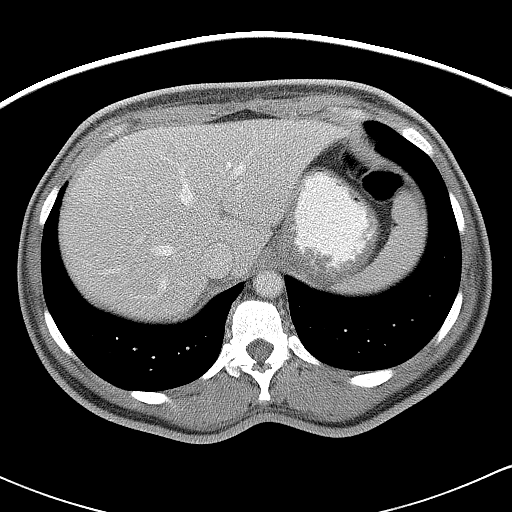
[im 15/29  soft-tissue]
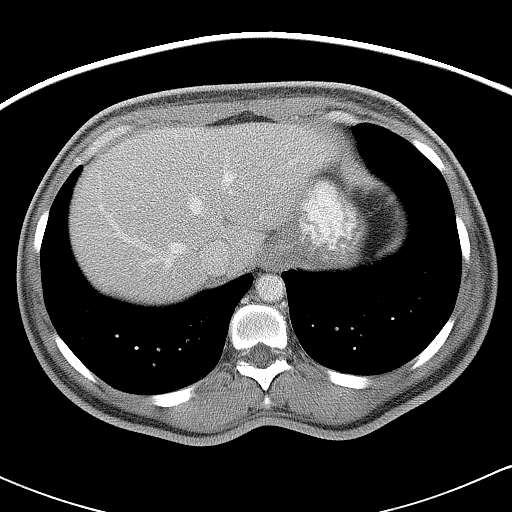
[im 17/29  soft-tissue]
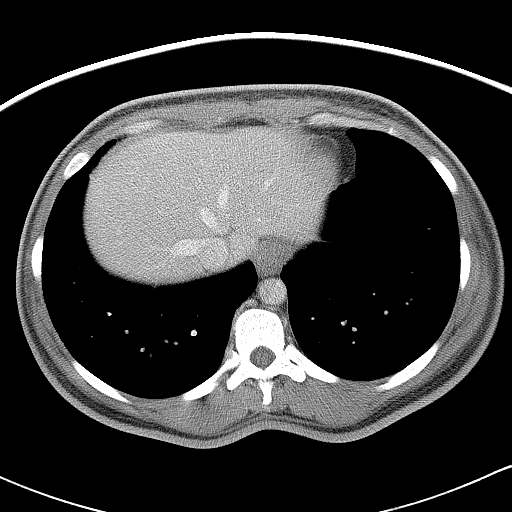
[im 19/29  soft-tissue]
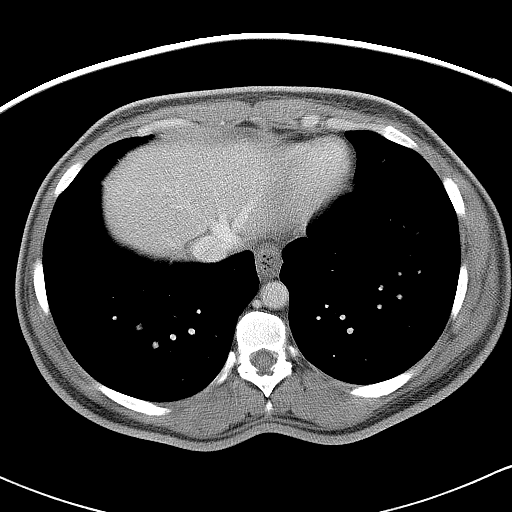
[im 19/29  bone]
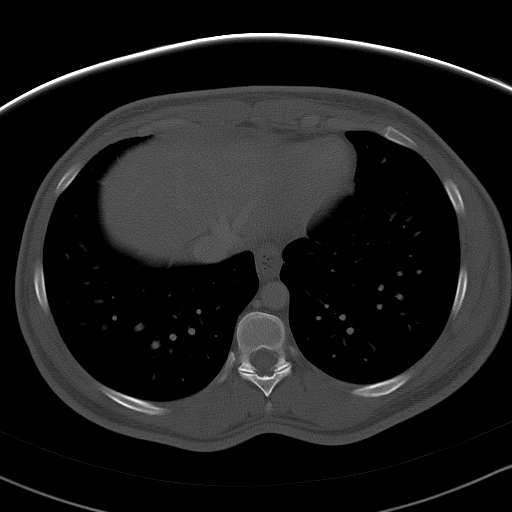
[im 21/29  soft-tissue]
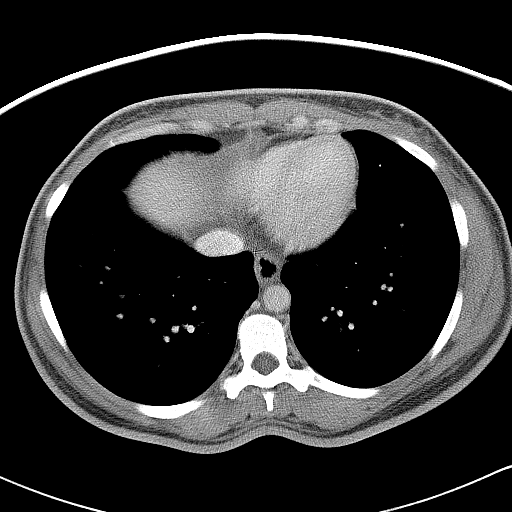
[im 23/29  soft-tissue]
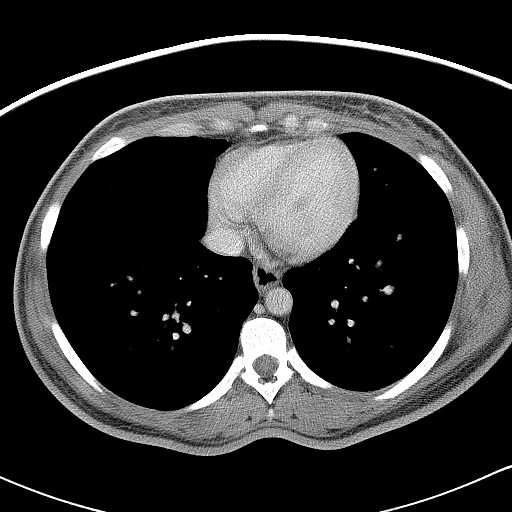
[im 25/29  soft-tissue]
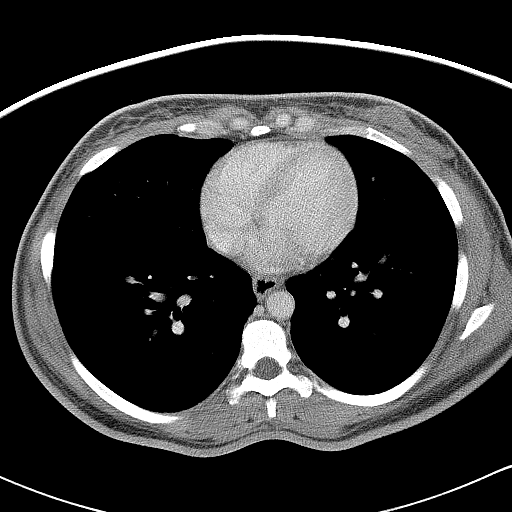
[im 25/29  lung]
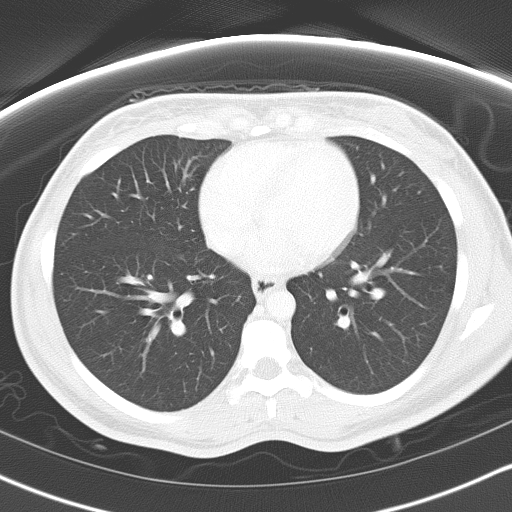
[im 26/29  lung]
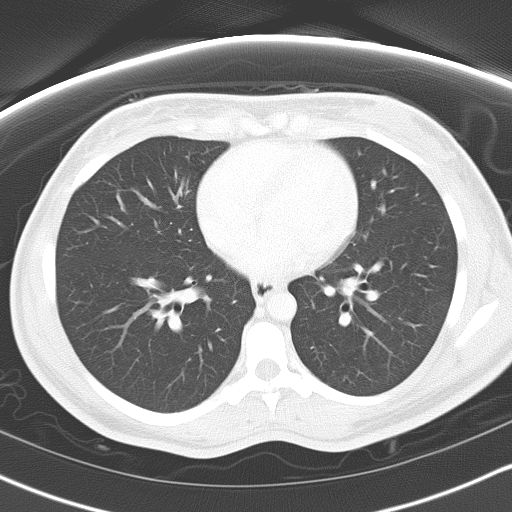
[im 27/29  soft-tissue]
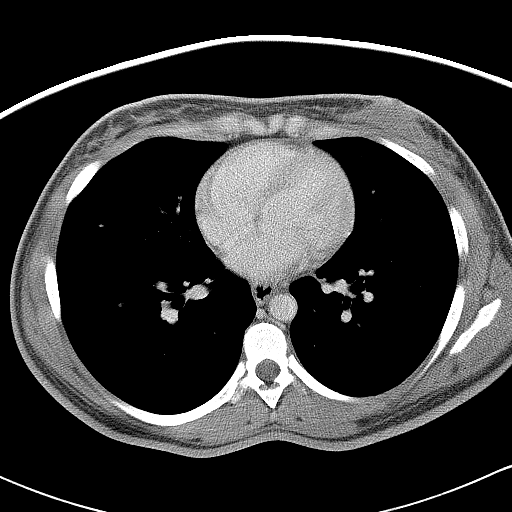
[im 27/29  lung]
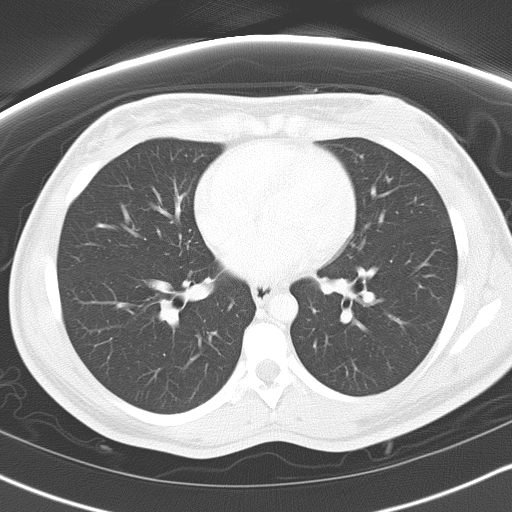
[im 28/29  lung]
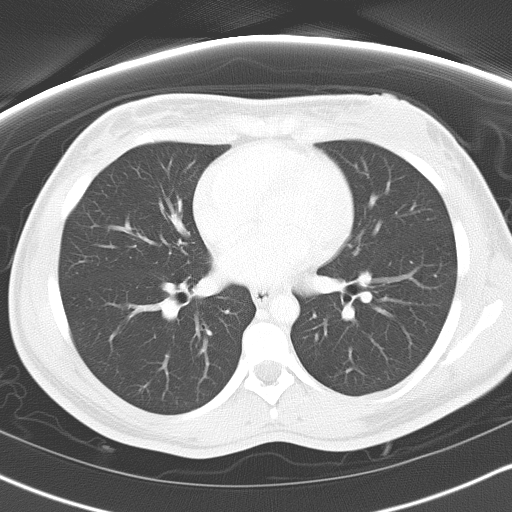

[15 of 29 positions shown; findings below may reference images not displayed]

FINDINGS: Lung bases clear.
Gallbladder surgically absent.
Minimal streak artifact traverses portions of right kidney from
dense contrast in colon.
Liver, spleen, pancreas, kidneys, and adrenal glands otherwise
normal.
No upper abdominal mass, adenopathy, free fluid, or inflammatory
process.
IMPRESSION: No acute upper abdominal abnormalities.

CT PELVIS
FINDINGS: Normal appendix, containing dense retained barium, noted on prior
CT.
Nonspecific low attenuation free pelvic fluid.
Tiny nodule in right lateral aspect of uterus, 15 x 10 mm, question
uterine fibroid.
Additional exophytic fibroid from anterior uterus, 3.2 x 3.3 x
cm.
Foley catheter decompresses urinary bladder.
Low attenuation mass left adnexa, likely ovarian cyst, measuring
2.6 x 3.2 x 3.9 cm.
Large and small bowel loops in pelvis normal.
No additional pelvic mass, adenopathy, or hernia.
Bones unremarkable.
IMPRESSION: Multiple uterine fibroids, including exophytic 3.3 cm nodule
arising from anterior uterus.
Low attenuation left adnexal mass, likely ovarian cyst, 3.9 cm
greatest size, recommend follow-up sonographic characterization.
Nonspecific free pelvic fluid.

## 2009-04-05 ENCOUNTER — Telehealth (INDEPENDENT_AMBULATORY_CARE_PROVIDER_SITE_OTHER): Payer: Self-pay | Admitting: *Deleted

## 2009-05-30 ENCOUNTER — Inpatient Hospital Stay (HOSPITAL_COMMUNITY): Admission: EM | Admit: 2009-05-30 | Discharge: 2009-06-03 | Payer: Self-pay | Admitting: Emergency Medicine

## 2009-05-31 ENCOUNTER — Ambulatory Visit: Payer: Self-pay | Admitting: Gastroenterology

## 2009-06-01 ENCOUNTER — Encounter: Payer: Self-pay | Admitting: Gastroenterology

## 2009-06-03 ENCOUNTER — Encounter: Payer: Self-pay | Admitting: Gastroenterology

## 2009-09-23 ENCOUNTER — Emergency Department (HOSPITAL_COMMUNITY): Admission: EM | Admit: 2009-09-23 | Discharge: 2009-09-24 | Payer: Self-pay | Admitting: Emergency Medicine

## 2009-10-02 ENCOUNTER — Emergency Department (HOSPITAL_COMMUNITY): Admission: EM | Admit: 2009-10-02 | Discharge: 2009-10-02 | Payer: Self-pay | Admitting: Emergency Medicine

## 2009-10-20 ENCOUNTER — Inpatient Hospital Stay (HOSPITAL_COMMUNITY): Admission: EM | Admit: 2009-10-20 | Discharge: 2009-10-22 | Payer: Self-pay | Admitting: Emergency Medicine

## 2009-11-10 IMAGING — CR DG CHEST 1V PORT
1 series · 1 of 1 positions shown · non-contrast
Comparison: Chest 06/10/2008.

CLINICAL DATA: Vomiting, hyperglycemia.

PORTABLE CHEST - 1 VIEW

[AP]
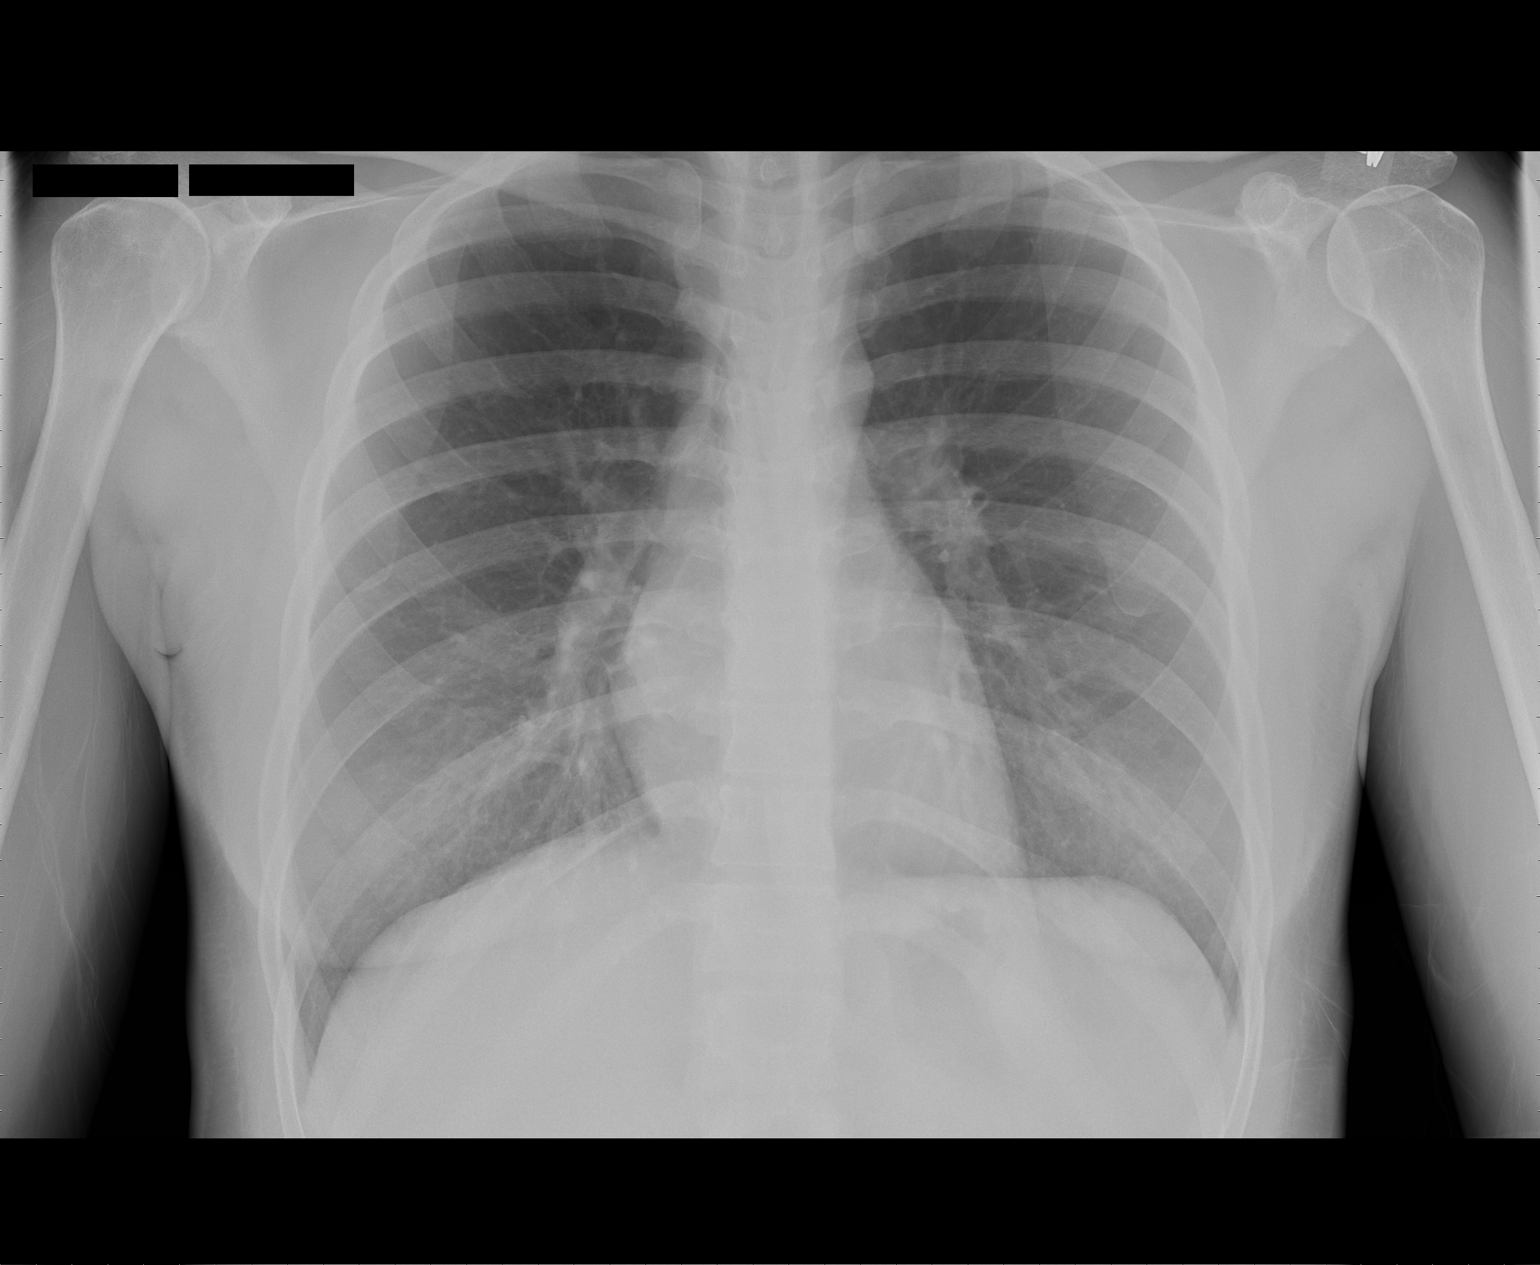

[1 of 1 positions shown; findings below may reference images not displayed]

FINDINGS: Lungs are clear.  Heart size is normal.  No pleural
effusion or focal bony abnormality.
IMPRESSION: Negative exam.

## 2009-12-06 IMAGING — CT CT PELVIS W/O CM
2 of 4 series · 16 of 46 positions shown, 18 images · non-contrast
Comparison: CT abdomen pelvis 06/10/2008

CT ABDOMEN

CLINICAL DATA: Abdominal pain, nausea, vomiting

CT ABDOMEN AND PELVIS WITHOUT CONTRAST
TECHNIQUE: Multidetector CT imaging of the abdomen and pelvis was
performed following the standard protocol without intravenous
contrast.

[Series 2: abd_pel 5.0 b40f st · axial · 0.61mm/px · z∈[-452,-68]mm · 13 of 85 slices shown, 15 images]
[im 4/85  soft-tissue]
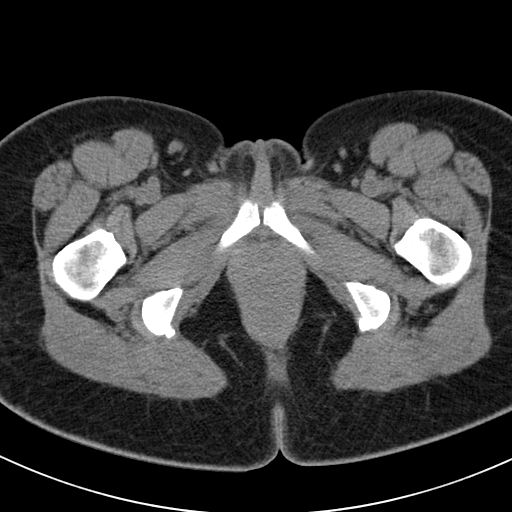
[im 4/85  bone]
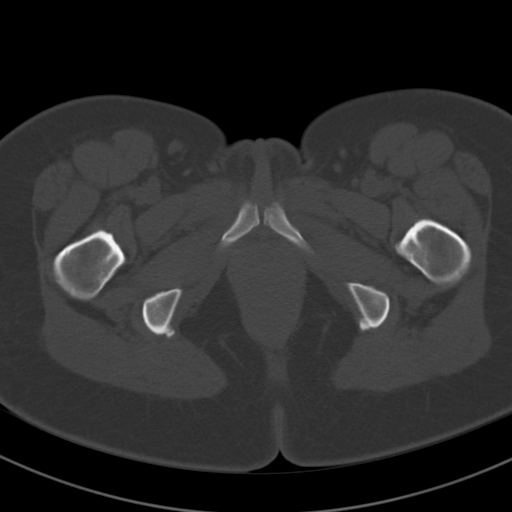
[im 11/85  soft-tissue]
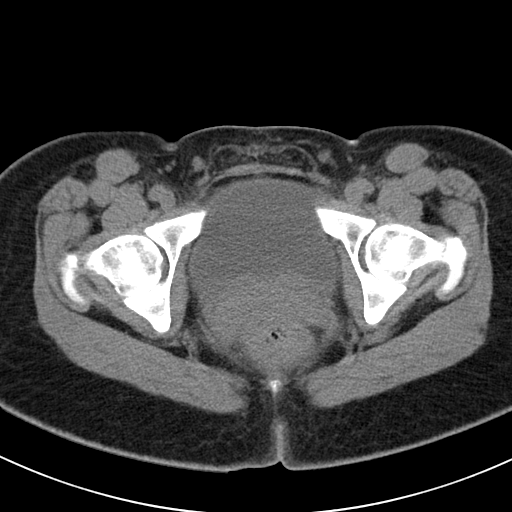
[im 17/85  soft-tissue]
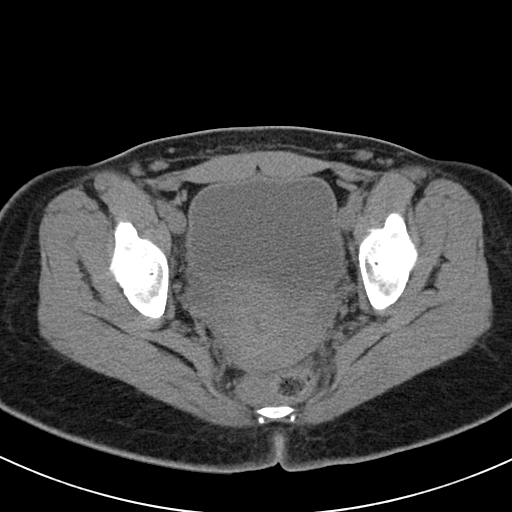
[im 24/85  soft-tissue]
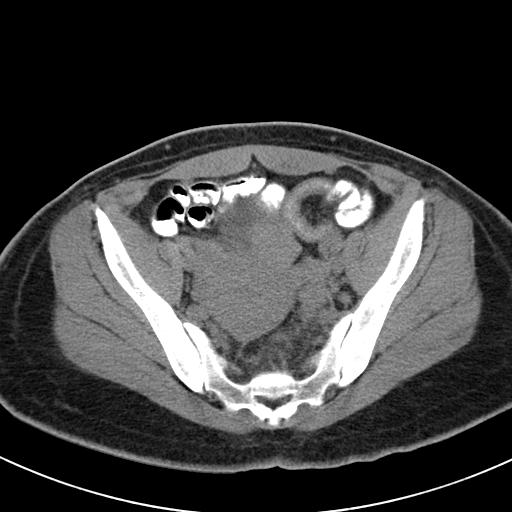
[im 31/85  soft-tissue]
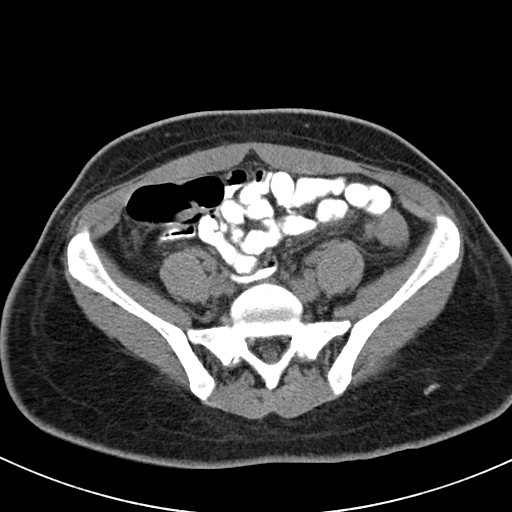
[im 37/85  soft-tissue]
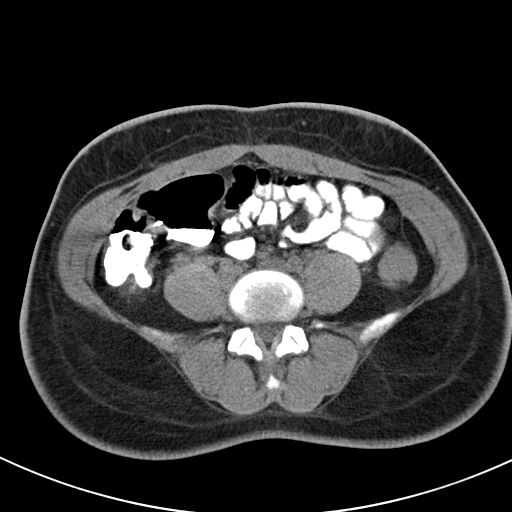
[im 44/85  soft-tissue]
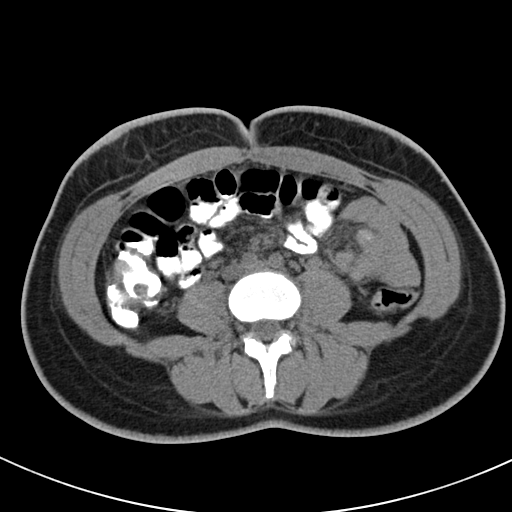
[im 48/85  soft-tissue]
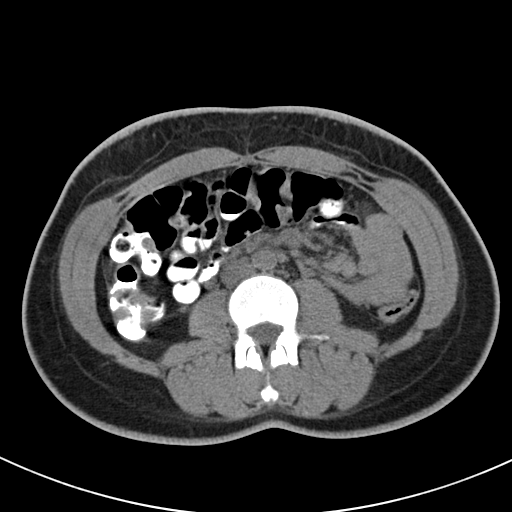
[im 54/85  soft-tissue]
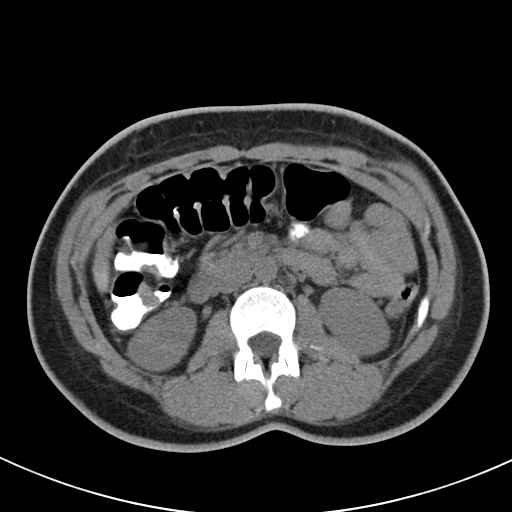
[im 54/85  bone]
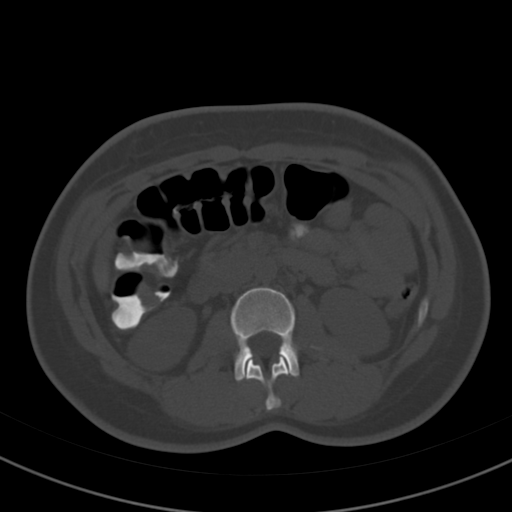
[im 61/85  soft-tissue]
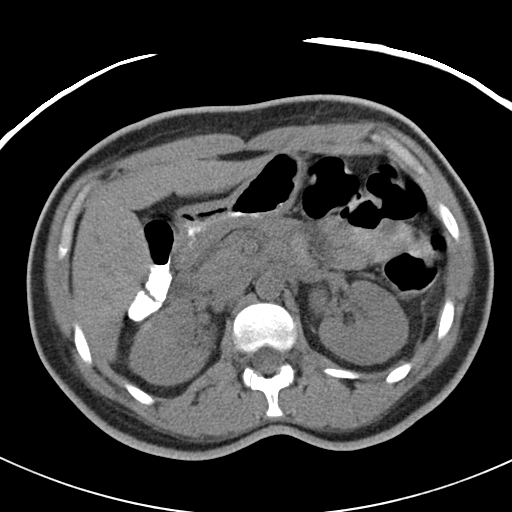
[im 68/85  soft-tissue]
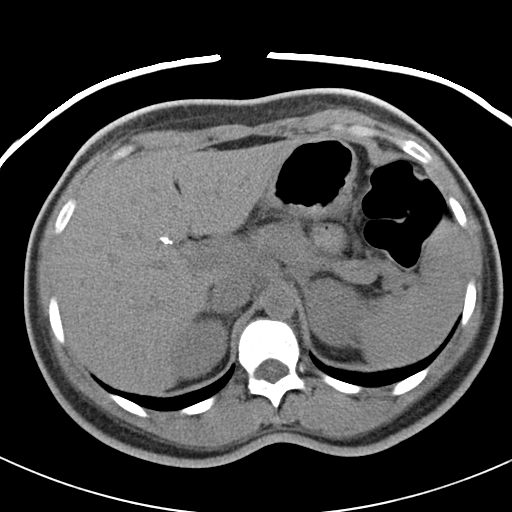
[im 74/85  soft-tissue]
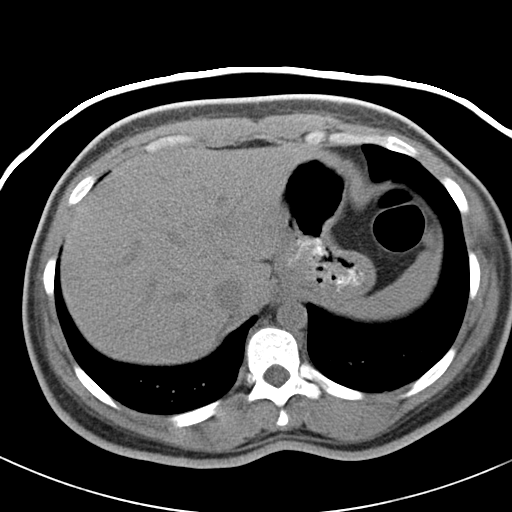
[im 81/85  soft-tissue]
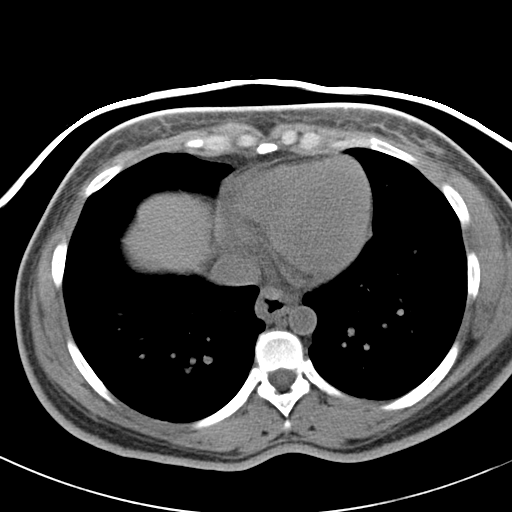

[Series 602: coronal abdomen · coronal · 0.86mm/px · 3 of 99 slices shown]
[im 33/99  soft-tissue]
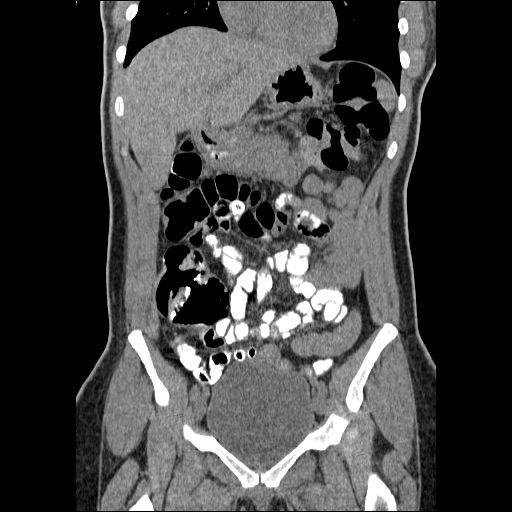
[im 44/99  soft-tissue]
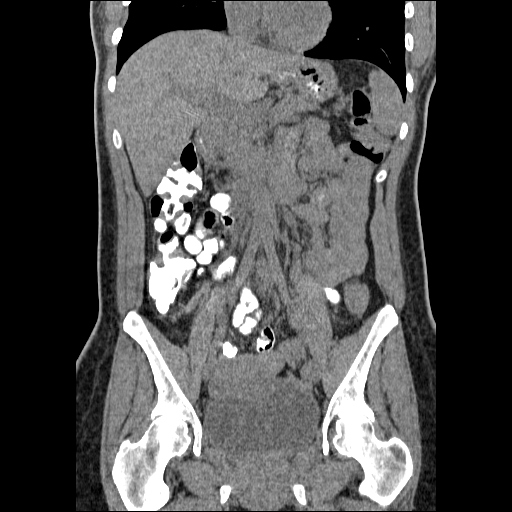
[im 55/99  soft-tissue]
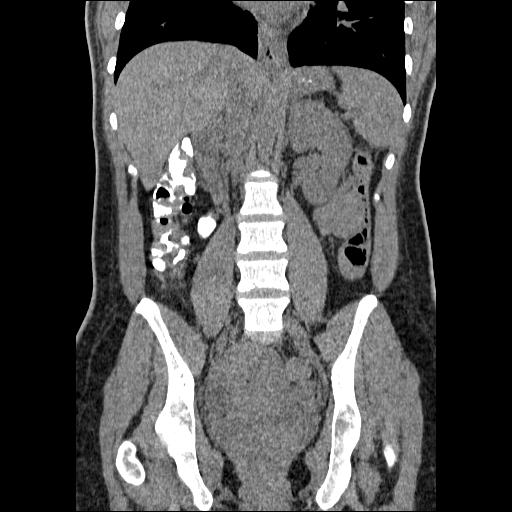

[16 of 46 positions shown; findings below may reference images not displayed]

FINDINGS: The visualized portion of the lung bases demonstrate
circumferential wall thickening of the  distal esophagus.
Additionally, there is mild gaseous distention of the visualized
distal esophagus.  Appearance of the distal esophagus is different
compared to the May 2008, at which time in the demonstrated a
normal wall circumferentially.  Small hiatal hernia is difficult to
exclude.  The visualized lung bases are clear.  There is no pleural
effusion.

Patient is status post cholecystectomy.  Unfused appearance of the
liver is within normal limits.

Kidneys are within normal limits for size.  There is no
hydronephrosis.  No renal calculi are identified.

Both ureters are normal in caliber.

The noncontrast appearance of the spleen, adrenal glands, and
pancreas is grossly within normal limits.

Stomach partially distended with air and oral contrast.  No obvious
gastric wall thickening.  Majority of the small bowel loops are
opacified with contrast and are within normal limits for caliber
and wall thickness.

Normal in caliber and wall thickness.

Scattered unenlarged retroperitoneal lymph nodes.  No
lymphadenopathy is identified.  Abdominal aorta normal in caliber.

No free intraperitoneal air is identified.  No acute osseous
abnormality.
IMPRESSION: 1.  Circumferential wall thickening of the distal esophagus.  This
represents an interval change compared to the May 2008.
Findings raise the question of esophagitis.  There is also mild
gaseous distention of distal esophagus.  Question if the patient
has any history of gastroesophageal reflux.
2.  Otherwise, no acute findings are identified.

3.  Cholecystectomy

CT PELVIS
FINDINGS: .  The appendix appears normal.  Pelvic small bowel
loops are normal in caliber and wall thickness.  Distal colon not
opacified with contrast at time imaging.  Previously identified
fluid collection in the left adnexa on CT of February 2089 is no
longer identified and likely resolved.  Uterus unremarkable by
noncontrast CT.  No adnexal mass or significant free fluid.
Urinary bladder demonstrates normal wall thickness.  No ureteral
dilatation.  No lymphadenopathy.
IMPRESSION: No acute findings identified in the pelvis.

## 2010-03-17 ENCOUNTER — Ambulatory Visit: Payer: Self-pay | Admitting: Internal Medicine

## 2010-03-17 ENCOUNTER — Inpatient Hospital Stay (HOSPITAL_COMMUNITY): Admission: EM | Admit: 2010-03-17 | Discharge: 2010-04-02 | Payer: Self-pay | Admitting: Emergency Medicine

## 2010-03-29 ENCOUNTER — Encounter: Payer: Self-pay | Admitting: Internal Medicine

## 2010-03-29 ENCOUNTER — Ambulatory Visit: Payer: Self-pay | Admitting: Internal Medicine

## 2010-03-29 ENCOUNTER — Ambulatory Visit: Payer: Self-pay | Admitting: Cardiovascular Disease

## 2010-03-30 ENCOUNTER — Encounter (INDEPENDENT_AMBULATORY_CARE_PROVIDER_SITE_OTHER): Payer: Self-pay | Admitting: Internal Medicine

## 2010-07-15 ENCOUNTER — Encounter: Payer: Self-pay | Admitting: Obstetrics and Gynecology

## 2010-08-06 IMAGING — CR DG CHEST 1V PORT
1 series · 1 of 1 positions shown · non-contrast
Comparison: 01/24/2009.

CLINICAL DATA: Hypoglycemia.  Recurrent nausea and vomiting.

PORTABLE CHEST - 1 VIEW

[view not recorded]
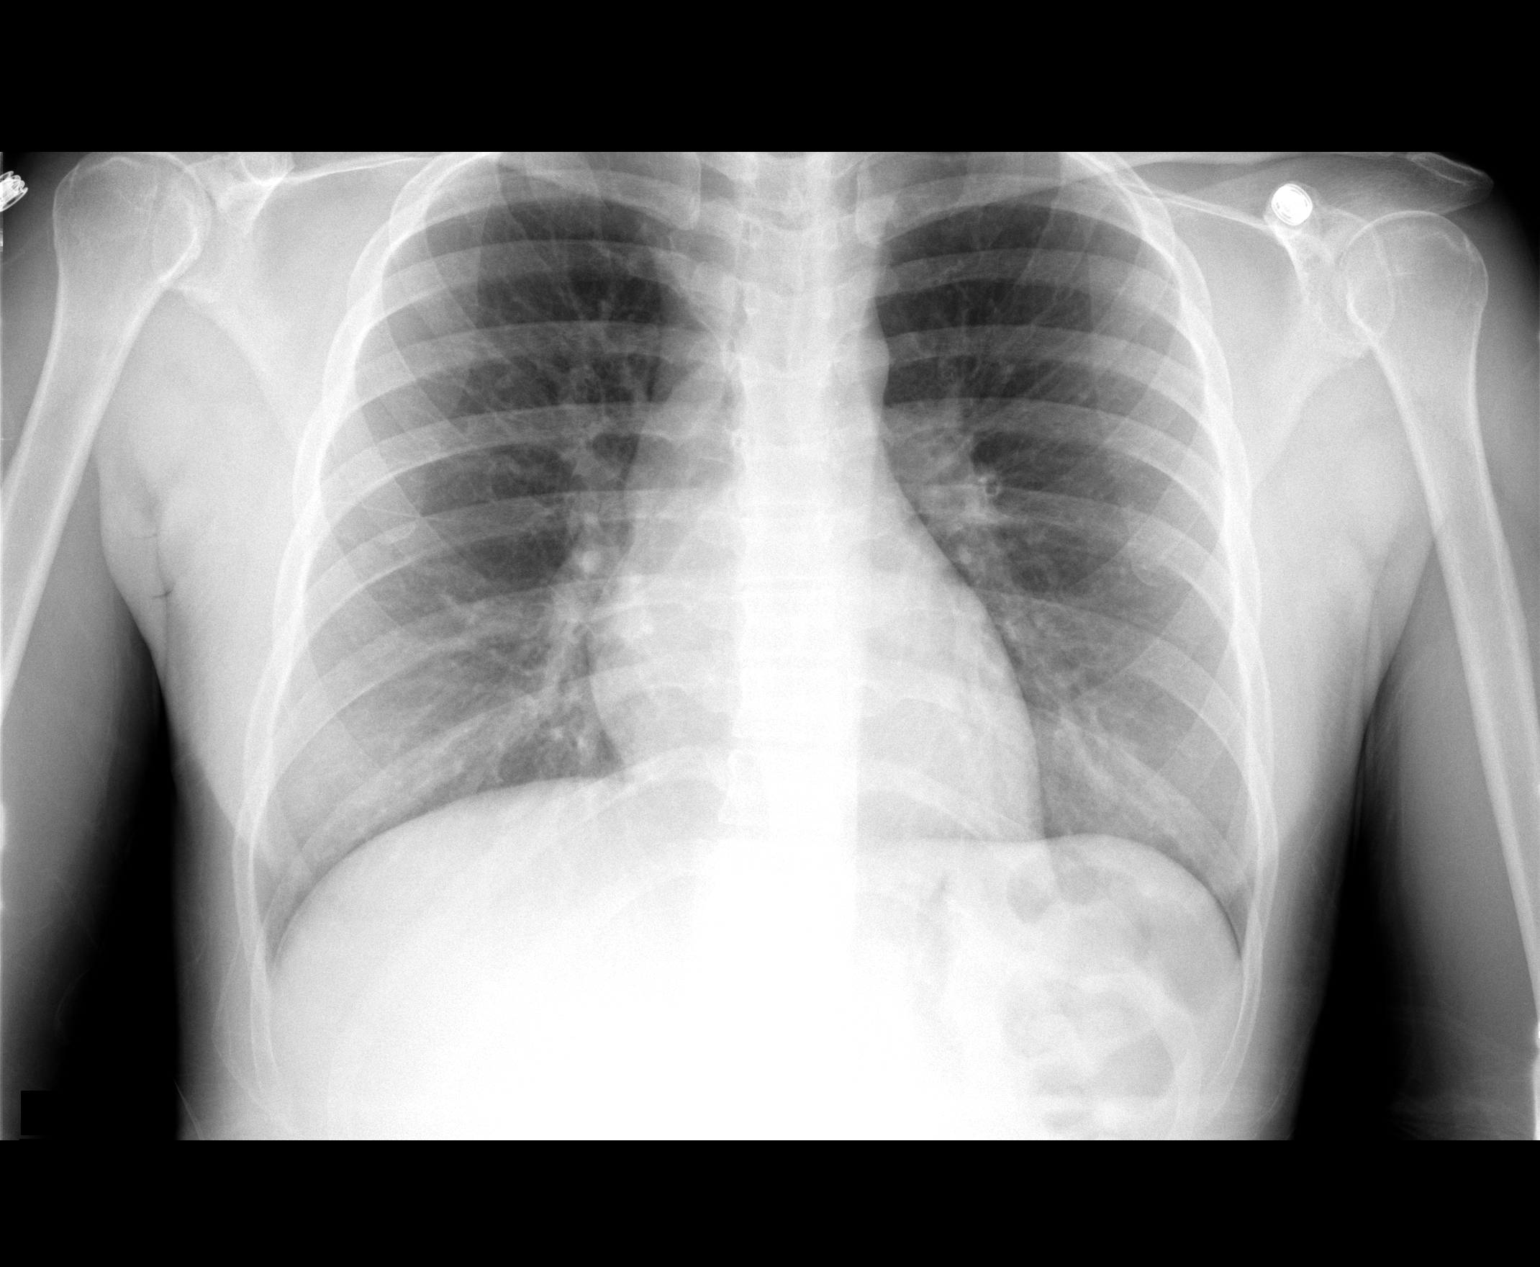

[1 of 1 positions shown; findings below may reference images not displayed]

FINDINGS: Central bronchial wall thickening is compatible with
bronchitic changes.  No active infiltrate, consolidation, or
atelectasis.  Normal cardiomediastinal silhouette is noted.  Bony
thorax is intact.
IMPRESSION: Chronic bronchitic changes.  No infiltrate.

## 2010-08-06 IMAGING — CR DG ABDOMEN 1V
1 series · 1 of 1 positions shown · non-contrast
Comparison: None.

CLINICAL DATA: Abdominal pain.  Reason for exam:  Rule out
obstruction.

ABDOMEN - 1 VIEW

[view not recorded]
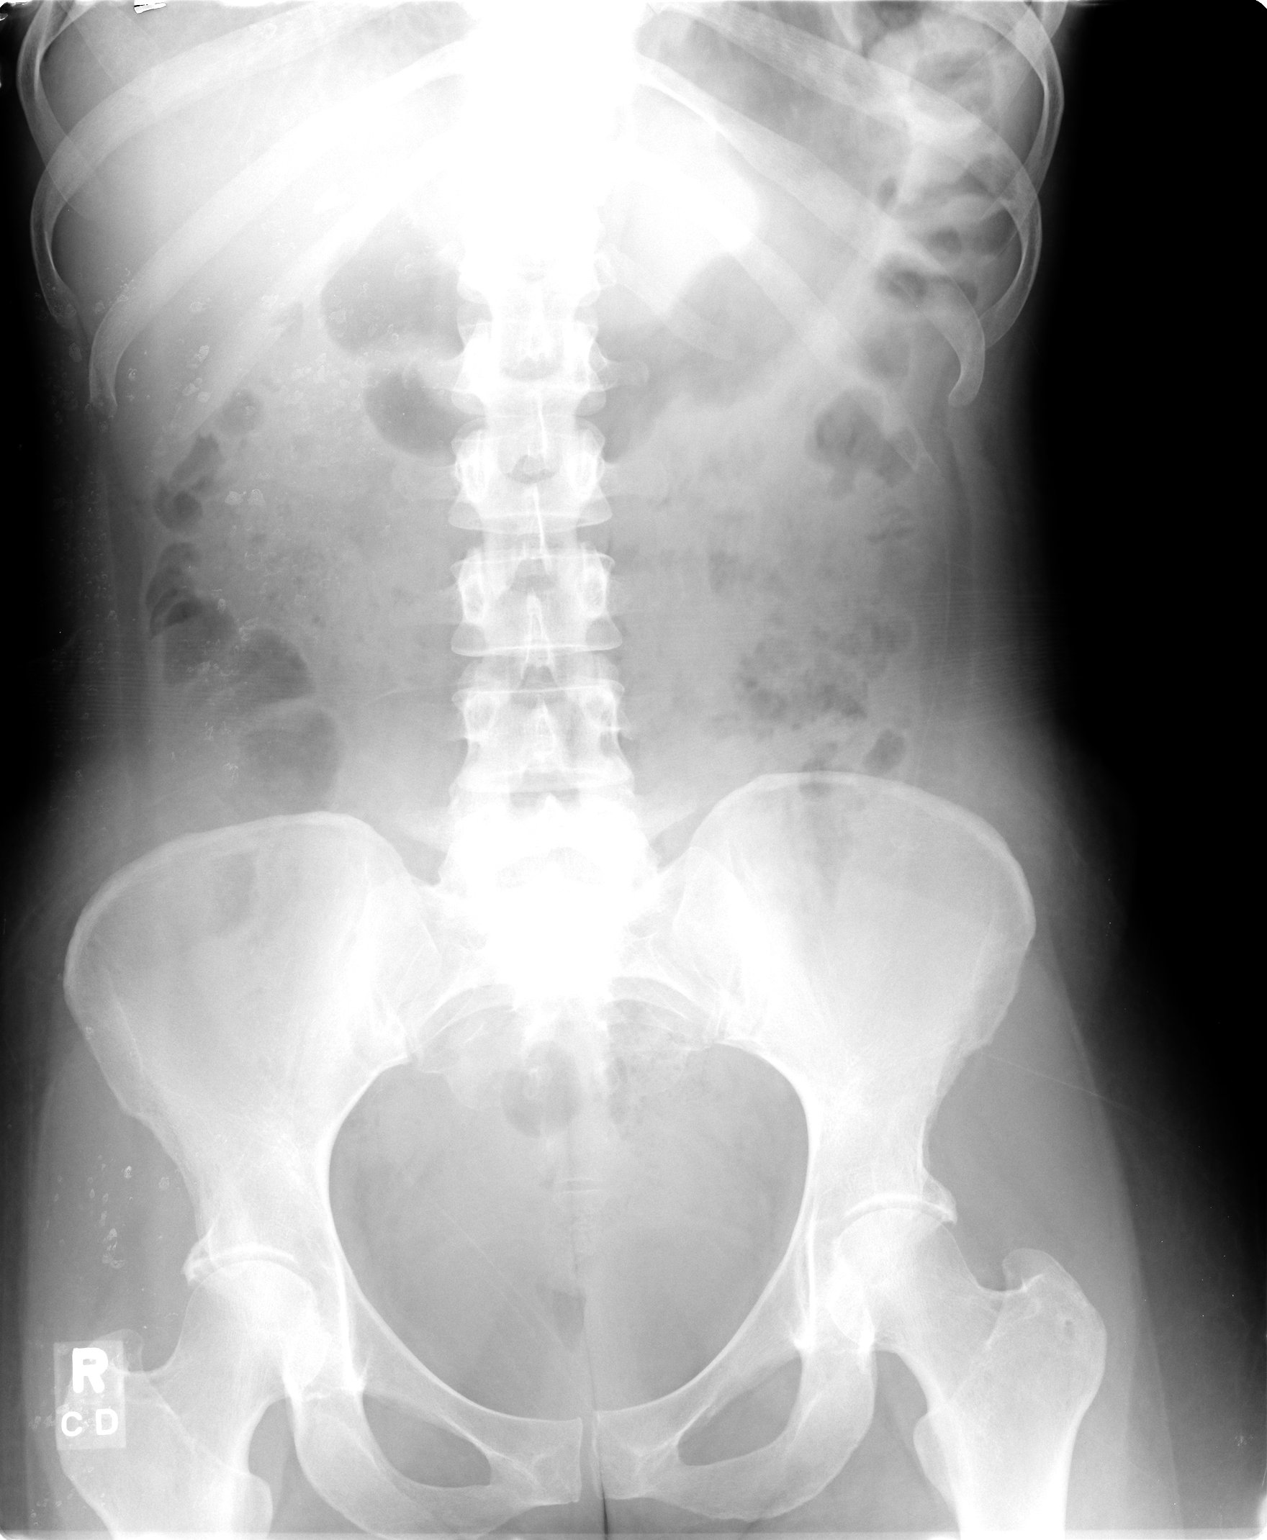

[1 of 1 positions shown; findings below may reference images not displayed]

FINDINGS: Bowel gas pattern is unremarkable.  Negative for ileus or
bowel obstruction.  Cholecystectomy clips.  Bony structures are
intact.
IMPRESSION: No acute abdominal findings.

## 2010-09-06 LAB — BASIC METABOLIC PANEL
BUN: 2 mg/dL — ABNORMAL LOW (ref 6–23)
BUN: 3 mg/dL — ABNORMAL LOW (ref 6–23)
BUN: 8 mg/dL (ref 6–23)
BUN: 9 mg/dL (ref 6–23)
CO2: 20 mEq/L (ref 19–32)
CO2: 21 mEq/L (ref 19–32)
CO2: 22 mEq/L (ref 19–32)
CO2: 22 mEq/L (ref 19–32)
Calcium: 7.6 mg/dL — ABNORMAL LOW (ref 8.4–10.5)
Calcium: 8 mg/dL — ABNORMAL LOW (ref 8.4–10.5)
Calcium: 8.1 mg/dL — ABNORMAL LOW (ref 8.4–10.5)
Chloride: 110 mEq/L (ref 96–112)
Chloride: 110 mEq/L (ref 96–112)
Chloride: 116 mEq/L — ABNORMAL HIGH (ref 96–112)
GFR calc Af Amer: >60 mL/min (ref >60)
GFR calc Af Amer: >60 mL/min (ref >60)
GFR calc non Af Amer: >60 mL/min (ref >60)
GFR calc non Af Amer: >60 mL/min (ref >60)
GFR calc non Af Amer: >60 mL/min (ref >60)
GFR calc non Af Amer: >60 mL/min (ref >60)
Glucose, Bld: 152 mg/dL — ABNORMAL HIGH (ref 70–99)
Glucose, Bld: 168 mg/dL — ABNORMAL HIGH (ref 70–99)
Glucose, Bld: 185 mg/dL — ABNORMAL HIGH (ref 70–99)
Glucose, Bld: 238 mg/dL — ABNORMAL HIGH (ref 70–99)
Glucose, Bld: 81 mg/dL (ref 70–99)
Potassium: 3.2 mEq/L — ABNORMAL LOW (ref 3.5–5.1)
Potassium: 3.3 mEq/L — ABNORMAL LOW (ref 3.5–5.1)
Potassium: 3.6 mEq/L (ref 3.5–5.1)
Potassium: 3.7 mEq/L (ref 3.5–5.1)
Potassium: 3.8 mEq/L (ref 3.5–5.1)
Sodium: 136 mEq/L (ref 135–145)
Sodium: 140 mEq/L (ref 135–145)

## 2010-09-06 LAB — GLUCOSE, CAPILLARY
Glucose-Capillary: 116 mg/dL — ABNORMAL HIGH (ref 70–99)
Glucose-Capillary: 126 mg/dL — ABNORMAL HIGH (ref 70–99)
Glucose-Capillary: 140 mg/dL — ABNORMAL HIGH (ref 70–99)
Glucose-Capillary: 161 mg/dL — ABNORMAL HIGH (ref 70–99)
Glucose-Capillary: 163 mg/dL — ABNORMAL HIGH (ref 70–99)
Glucose-Capillary: 168 mg/dL — ABNORMAL HIGH (ref 70–99)
Glucose-Capillary: 170 mg/dL — ABNORMAL HIGH (ref 70–99)
Glucose-Capillary: 173 mg/dL — ABNORMAL HIGH (ref 70–99)
Glucose-Capillary: 178 mg/dL — ABNORMAL HIGH (ref 70–99)
Glucose-Capillary: 181 mg/dL — ABNORMAL HIGH (ref 70–99)
Glucose-Capillary: 190 mg/dL — ABNORMAL HIGH (ref 70–99)
Glucose-Capillary: 217 mg/dL — ABNORMAL HIGH (ref 70–99)
Glucose-Capillary: 218 mg/dL — ABNORMAL HIGH (ref 70–99)
Glucose-Capillary: 226 mg/dL — ABNORMAL HIGH (ref 70–99)
Glucose-Capillary: 226 mg/dL — ABNORMAL HIGH (ref 70–99)
Glucose-Capillary: 228 mg/dL — ABNORMAL HIGH (ref 70–99)
Glucose-Capillary: 230 mg/dL — ABNORMAL HIGH (ref 70–99)
Glucose-Capillary: 242 mg/dL — ABNORMAL HIGH (ref 70–99)
Glucose-Capillary: 255 mg/dL — ABNORMAL HIGH (ref 70–99)
Glucose-Capillary: 264 mg/dL — ABNORMAL HIGH (ref 70–99)
Glucose-Capillary: 274 mg/dL — ABNORMAL HIGH (ref 70–99)
Glucose-Capillary: 277 mg/dL — ABNORMAL HIGH (ref 70–99)
Glucose-Capillary: 297 mg/dL — ABNORMAL HIGH (ref 70–99)
Glucose-Capillary: 316 mg/dL — ABNORMAL HIGH (ref 70–99)
Glucose-Capillary: 340 mg/dL — ABNORMAL HIGH (ref 70–99)
Glucose-Capillary: 517 mg/dL — ABNORMAL HIGH (ref 70–99)
Glucose-Capillary: 53 mg/dL — ABNORMAL LOW (ref 70–99)
Glucose-Capillary: 68 mg/dL — ABNORMAL LOW (ref 70–99)
Glucose-Capillary: 82 mg/dL (ref 70–99)
Glucose-Capillary: 86 mg/dL (ref 70–99)

## 2010-09-06 LAB — RAPID URINE DRUG SCREEN, HOSP PERFORMED
Amphetamines: NOT DETECTED
Benzodiazepines: NOT DETECTED
Cocaine: NOT DETECTED
Tetrahydrocannabinol: NOT DETECTED

## 2010-09-06 LAB — CBC
HCT: 23.3 % — ABNORMAL LOW (ref 36.0–46.0)
HCT: 24.8 % — ABNORMAL LOW (ref 36.0–46.0)
HCT: 25.1 % — ABNORMAL LOW (ref 36.0–46.0)
HCT: 25.6 % — ABNORMAL LOW (ref 36.0–46.0)
HCT: 26.5 % — ABNORMAL LOW (ref 36.0–46.0)
HCT: 30.3 % — ABNORMAL LOW (ref 36.0–46.0)
Hemoglobin: 8 g/dL — ABNORMAL LOW (ref 12.0–15.0)
Hemoglobin: 8.5 g/dL — ABNORMAL LOW (ref 12.0–15.0)
Hemoglobin: 9.7 g/dL — ABNORMAL LOW (ref 12.0–15.0)
MCH: 24.7 pg — ABNORMAL LOW (ref 26.0–34.0)
MCH: 25 pg — ABNORMAL LOW (ref 26.0–34.0)
MCH: 25.1 pg — ABNORMAL LOW (ref 26.0–34.0)
MCH: 25.2 pg — ABNORMAL LOW (ref 26.0–34.0)
MCH: 25.2 pg — ABNORMAL LOW (ref 26.0–34.0)
MCHC: 32 g/dL (ref 30.0–36.0)
MCHC: 32.1 g/dL (ref 30.0–36.0)
MCHC: 32.7 g/dL (ref 30.0–36.0)
MCHC: 33 g/dL (ref 30.0–36.0)
MCHC: 33.3 g/dL (ref 30.0–36.0)
MCV: 75.2 fL — ABNORMAL LOW (ref 78.0–100.0)
MCV: 76.9 fL — ABNORMAL LOW (ref 78.0–100.0)
MCV: 76.9 fL — ABNORMAL LOW (ref 78.0–100.0)
MCV: 77.1 fL — ABNORMAL LOW (ref 78.0–100.0)
Platelets: 315 10*3/uL (ref 150–400)
Platelets: 335 10*3/uL (ref 150–400)
RBC: 3.03 MIL/uL — ABNORMAL LOW (ref 3.87–5.11)
RBC: 3.14 MIL/uL — ABNORMAL LOW (ref 3.87–5.11)
RBC: 3.22 MIL/uL — ABNORMAL LOW (ref 3.87–5.11)
RDW: 19 % — ABNORMAL HIGH (ref 11.5–15.5)
RDW: 19.9 % — ABNORMAL HIGH (ref 11.5–15.5)
RDW: 20.4 % — ABNORMAL HIGH (ref 11.5–15.5)
RDW: 20.6 % — ABNORMAL HIGH (ref 11.5–15.5)
WBC: 4.3 10*3/uL (ref 4.0–10.5)
WBC: 8.5 10*3/uL (ref 4.0–10.5)

## 2010-09-06 LAB — CARDIAC PANEL(CRET KIN+CKTOT+MB+TROPI)
CK, MB: 2.8 ng/mL (ref 0.3–4.0)
CK, MB: 4.2 ng/mL — ABNORMAL HIGH (ref 0.3–4.0)
Relative Index: 1.3 (ref 0.0–2.5)
Relative Index: 1.3 (ref 0.0–2.5)
Relative Index: 2 (ref 0.0–2.5)
Relative Index: 2.7 — ABNORMAL HIGH (ref 0.0–2.5)
Total CK: 136 U/L (ref 7–177)

## 2010-09-06 LAB — BLOOD GAS, ARTERIAL
FIO2: 0.21 %
Patient temperature: 98.6
TCO2: 9.8 mmol/L (ref 0–100)
pH, Arterial: 7.277 — ABNORMAL LOW (ref 7.350–7.400)

## 2010-09-06 LAB — BASIC METABOLIC PANEL WITH GFR
BUN: 11 mg/dL (ref 6–23)
CO2: 11 meq/L — ABNORMAL LOW (ref 19–32)
Calcium: 8.1 mg/dL — ABNORMAL LOW (ref 8.4–10.5)
Chloride: 117 meq/L — ABNORMAL HIGH (ref 96–112)
Creatinine, Ser: 1.25 mg/dL — ABNORMAL HIGH (ref 0.4–1.2)
GFR calc non Af Amer: 47 mL/min — ABNORMAL LOW
Glucose, Bld: 220 mg/dL — ABNORMAL HIGH (ref 70–99)
Potassium: 4 meq/L (ref 3.5–5.1)
Sodium: 140 meq/L (ref 135–145)

## 2010-09-06 LAB — POCT I-STAT, CHEM 8
BUN: 12 mg/dL (ref 6–23)
Calcium, Ion: 1.15 mmol/L (ref 1.12–1.32)
TCO2: 10 mmol/L (ref 0–100)

## 2010-09-06 LAB — URINALYSIS, ROUTINE W REFLEX MICROSCOPIC
Bilirubin Urine: NEGATIVE
Ketones, ur: >80 mg/dL — AB
Nitrite: NEGATIVE
pH: 5.5 (ref 5.0–8.0)

## 2010-09-06 LAB — URINE CULTURE

## 2010-09-06 LAB — STOOL CULTURE

## 2010-09-06 LAB — TSH
TSH: 0.339 u[IU]/mL — ABNORMAL LOW (ref 0.350–4.500)
TSH: 0.539 u[IU]/mL (ref 0.350–4.500)

## 2010-09-06 LAB — HEMOGLOBIN A1C
Hgb A1c MFr Bld: 11.4 % — ABNORMAL HIGH
Mean Plasma Glucose: 280 mg/dL — ABNORMAL HIGH

## 2010-09-06 LAB — RETICULOCYTES
Retic Count, Absolute: 46.6 10*3/uL (ref 19.0–186.0)
Retic Ct Pct: 1.4 % (ref 0.4–3.1)

## 2010-09-06 LAB — IRON AND TIBC
Iron: 85 ug/dL (ref 42–135)
Saturation Ratios: 36 % (ref 20–55)

## 2010-09-06 LAB — DIFFERENTIAL
Basophils Relative: 1 % (ref 0–1)
Eosinophils Absolute: 0 10*3/uL (ref 0.0–0.7)
Neutrophils Relative %: 81 % — ABNORMAL HIGH (ref 43–77)

## 2010-09-06 LAB — COMPREHENSIVE METABOLIC PANEL
ALT: 25 U/L (ref 0–35)
Alkaline Phosphatase: 51 U/L (ref 39–117)
CO2: 10 mEq/L — ABNORMAL LOW (ref 19–32)
GFR calc non Af Amer: 41 mL/min — ABNORMAL LOW (ref >60)
Glucose, Bld: 548 mg/dL — ABNORMAL HIGH (ref 70–99)
Potassium: 4.5 mEq/L (ref 3.5–5.1)
Sodium: 135 mEq/L (ref 135–145)

## 2010-09-06 LAB — PREGNANCY, URINE: Preg Test, Ur: NEGATIVE

## 2010-09-06 LAB — KETONES, QUALITATIVE

## 2010-09-06 LAB — URINE MICROSCOPIC-ADD ON

## 2010-09-06 LAB — MRSA PCR SCREENING: MRSA by PCR: NEGATIVE

## 2010-09-06 LAB — T4, FREE: Free T4: 1.25 ng/dL (ref 0.80–1.80)

## 2010-09-07 LAB — POCT I-STAT, CHEM 8
BUN: 22 mg/dL (ref 6–23)
Calcium, Ion: 1.21 mmol/L (ref 1.12–1.32)
Chloride: 108 meq/L (ref 96–112)
Creatinine, Ser: 1.2 mg/dL (ref 0.4–1.2)
Glucose, Bld: 577 mg/dL (ref 70–99)
HCT: 43 % (ref 36.0–46.0)
Hemoglobin: 14.6 g/dL (ref 12.0–15.0)
Potassium: 4.7 meq/L (ref 3.5–5.1)
Sodium: 134 mEq/L — ABNORMAL LOW (ref 135–145)
TCO2: 11 mmol/L (ref 0–100)

## 2010-09-07 LAB — URINALYSIS, ROUTINE W REFLEX MICROSCOPIC
Bilirubin Urine: NEGATIVE
Glucose, UA: >1000 mg/dL — AB
Glucose, UA: >1000 mg/dL — AB
Hgb urine dipstick: NEGATIVE
Ketones, ur: >80 mg/dL — AB
Leukocytes, UA: NEGATIVE
Nitrite: NEGATIVE
Nitrite: POSITIVE — AB
Protein, ur: NEGATIVE mg/dL
Specific Gravity, Urine: 1.022 (ref 1.005–1.030)
Specific Gravity, Urine: 1.027 (ref 1.005–1.030)
Urobilinogen, UA: 0.2 mg/dL (ref 0.0–1.0)
pH: 5.5 (ref 5.0–8.0)
pH: 6 (ref 5.0–8.0)

## 2010-09-07 LAB — STOOL CULTURE

## 2010-09-07 LAB — BASIC METABOLIC PANEL
BUN: 12 mg/dL (ref 6–23)
BUN: 14 mg/dL (ref 6–23)
BUN: 2 mg/dL — ABNORMAL LOW (ref 6–23)
BUN: 21 mg/dL (ref 6–23)
CO2: 16 mEq/L — ABNORMAL LOW (ref 19–32)
CO2: 18 mEq/L — ABNORMAL LOW (ref 19–32)
CO2: 21 mEq/L (ref 19–32)
CO2: 24 mEq/L (ref 19–32)
Calcium: 7.4 mg/dL — ABNORMAL LOW (ref 8.4–10.5)
Calcium: 8 mg/dL — ABNORMAL LOW (ref 8.4–10.5)
Calcium: 8.4 mg/dL (ref 8.4–10.5)
Calcium: 8.6 mg/dL (ref 8.4–10.5)
Calcium: 8.6 mg/dL (ref 8.4–10.5)
Calcium: 8.8 mg/dL (ref 8.4–10.5)
Chloride: 101 mEq/L (ref 96–112)
Chloride: 103 mEq/L (ref 96–112)
Chloride: 111 mEq/L (ref 96–112)
Creatinine, Ser: 0.91 mg/dL (ref 0.4–1.2)
Creatinine, Ser: 0.93 mg/dL (ref 0.4–1.2)
Creatinine, Ser: 1.18 mg/dL (ref 0.4–1.2)
GFR calc Af Amer: 40 mL/min — ABNORMAL LOW (ref >60)
GFR calc Af Amer: 50 mL/min — ABNORMAL LOW (ref >60)
GFR calc Af Amer: 56 mL/min — ABNORMAL LOW (ref >60)
GFR calc Af Amer: >60 mL/min (ref >60)
GFR calc Af Amer: >60 mL/min (ref >60)
GFR calc Af Amer: >60 mL/min (ref >60)
GFR calc Af Amer: >60 mL/min (ref >60)
GFR calc Af Amer: >60 mL/min (ref >60)
GFR calc non Af Amer: 33 mL/min — ABNORMAL LOW (ref >60)
GFR calc non Af Amer: 42 mL/min — ABNORMAL LOW (ref >60)
GFR calc non Af Amer: 50 mL/min — ABNORMAL LOW (ref >60)
GFR calc non Af Amer: 52 mL/min — ABNORMAL LOW (ref >60)
GFR calc non Af Amer: 52 mL/min — ABNORMAL LOW (ref >60)
GFR calc non Af Amer: 54 mL/min — ABNORMAL LOW (ref >60)
GFR calc non Af Amer: >60 mL/min (ref >60)
GFR calc non Af Amer: >60 mL/min (ref >60)
Glucose, Bld: 240 mg/dL — ABNORMAL HIGH (ref 70–99)
Glucose, Bld: 99 mg/dL (ref 70–99)
Potassium: 3.5 mEq/L (ref 3.5–5.1)
Potassium: 3.6 mEq/L (ref 3.5–5.1)
Potassium: 3.8 mEq/L (ref 3.5–5.1)
Potassium: 3.9 mEq/L (ref 3.5–5.1)
Potassium: 4 mEq/L (ref 3.5–5.1)
Potassium: 4.5 mEq/L (ref 3.5–5.1)
Potassium: 4.5 mEq/L (ref 3.5–5.1)
Potassium: 5.2 mEq/L — ABNORMAL HIGH (ref 3.5–5.1)
Sodium: 135 mEq/L (ref 135–145)
Sodium: 136 mEq/L (ref 135–145)
Sodium: 137 mEq/L (ref 135–145)
Sodium: 138 mEq/L (ref 135–145)
Sodium: 139 mEq/L (ref 135–145)
Sodium: 139 mEq/L (ref 135–145)
Sodium: 140 mEq/L (ref 135–145)
Sodium: 141 mEq/L (ref 135–145)

## 2010-09-07 LAB — CULTURE, BLOOD (ROUTINE X 2)
Culture  Setup Time: 201109240150
Culture  Setup Time: 201109241343
Culture  Setup Time: 201109282108
Culture: NO GROWTH
Culture: NO GROWTH

## 2010-09-07 LAB — HEMOGLOBIN AND HEMATOCRIT, BLOOD
HCT: 29.8 % — ABNORMAL LOW (ref 36.0–46.0)
Hemoglobin: 9.8 g/dL — ABNORMAL LOW (ref 12.0–15.0)

## 2010-09-07 LAB — CBC
HCT: 28.3 % — ABNORMAL LOW (ref 36.0–46.0)
HCT: 28.5 % — ABNORMAL LOW (ref 36.0–46.0)
HCT: 32.5 % — ABNORMAL LOW (ref 36.0–46.0)
HCT: 35.8 % — ABNORMAL LOW (ref 36.0–46.0)
HCT: 37.8 % (ref 36.0–46.0)
Hemoglobin: 10.1 g/dL — ABNORMAL LOW (ref 12.0–15.0)
Hemoglobin: 10.5 g/dL — ABNORMAL LOW (ref 12.0–15.0)
Hemoglobin: 11.9 g/dL — ABNORMAL LOW (ref 12.0–15.0)
Hemoglobin: 12.4 g/dL (ref 12.0–15.0)
Hemoglobin: 9.3 g/dL — ABNORMAL LOW (ref 12.0–15.0)
Hemoglobin: 9.4 g/dL — ABNORMAL LOW (ref 12.0–15.0)
MCH: 24.3 pg — ABNORMAL LOW (ref 26.0–34.0)
MCH: 24.4 pg — ABNORMAL LOW (ref 26.0–34.0)
MCH: 24.4 pg — ABNORMAL LOW (ref 26.0–34.0)
MCH: 24.5 pg — ABNORMAL LOW (ref 26.0–34.0)
MCH: 24.6 pg — ABNORMAL LOW (ref 26.0–34.0)
MCHC: 32.4 g/dL (ref 30.0–36.0)
MCHC: 32.4 g/dL (ref 30.0–36.0)
MCHC: 32.6 g/dL (ref 30.0–36.0)
MCHC: 32.7 g/dL (ref 30.0–36.0)
MCHC: 33.1 g/dL (ref 30.0–36.0)
MCV: 74.1 fL — ABNORMAL LOW (ref 78.0–100.0)
MCV: 74.7 fL — ABNORMAL LOW (ref 78.0–100.0)
MCV: 75.5 fL — ABNORMAL LOW (ref 78.0–100.0)
Platelets: 303 K/uL (ref 150–400)
Platelets: 352 10*3/uL (ref 150–400)
Platelets: 463 10*3/uL — ABNORMAL HIGH (ref 150–400)
RBC: 3.77 MIL/uL — ABNORMAL LOW (ref 3.87–5.11)
RBC: 4.34 MIL/uL (ref 3.87–5.11)
RBC: 4.95 MIL/uL (ref 3.87–5.11)
RBC: 5.07 MIL/uL (ref 3.87–5.11)
RDW: 16.1 % — ABNORMAL HIGH (ref 11.5–15.5)
RDW: 16.8 % — ABNORMAL HIGH (ref 11.5–15.5)
RDW: 17 % — ABNORMAL HIGH (ref 11.5–15.5)
RDW: 17.1 % — ABNORMAL HIGH (ref 11.5–15.5)
RDW: 17.4 % — ABNORMAL HIGH (ref 11.5–15.5)
WBC: 17.5 10*3/uL — ABNORMAL HIGH (ref 4.0–10.5)
WBC: 4.7 10*3/uL (ref 4.0–10.5)
WBC: 5.1 10*3/uL (ref 4.0–10.5)
WBC: 5.7 K/uL (ref 4.0–10.5)

## 2010-09-07 LAB — GLUCOSE, CAPILLARY
Glucose-Capillary: 104 mg/dL — ABNORMAL HIGH (ref 70–99)
Glucose-Capillary: 104 mg/dL — ABNORMAL HIGH (ref 70–99)
Glucose-Capillary: 110 mg/dL — ABNORMAL HIGH (ref 70–99)
Glucose-Capillary: 116 mg/dL — ABNORMAL HIGH (ref 70–99)
Glucose-Capillary: 119 mg/dL — ABNORMAL HIGH (ref 70–99)
Glucose-Capillary: 121 mg/dL — ABNORMAL HIGH (ref 70–99)
Glucose-Capillary: 122 mg/dL — ABNORMAL HIGH (ref 70–99)
Glucose-Capillary: 132 mg/dL — ABNORMAL HIGH (ref 70–99)
Glucose-Capillary: 132 mg/dL — ABNORMAL HIGH (ref 70–99)
Glucose-Capillary: 133 mg/dL — ABNORMAL HIGH (ref 70–99)
Glucose-Capillary: 139 mg/dL — ABNORMAL HIGH (ref 70–99)
Glucose-Capillary: 143 mg/dL — ABNORMAL HIGH (ref 70–99)
Glucose-Capillary: 145 mg/dL — ABNORMAL HIGH (ref 70–99)
Glucose-Capillary: 147 mg/dL — ABNORMAL HIGH (ref 70–99)
Glucose-Capillary: 151 mg/dL — ABNORMAL HIGH (ref 70–99)
Glucose-Capillary: 151 mg/dL — ABNORMAL HIGH (ref 70–99)
Glucose-Capillary: 153 mg/dL — ABNORMAL HIGH (ref 70–99)
Glucose-Capillary: 164 mg/dL — ABNORMAL HIGH (ref 70–99)
Glucose-Capillary: 169 mg/dL — ABNORMAL HIGH (ref 70–99)
Glucose-Capillary: 175 mg/dL — ABNORMAL HIGH (ref 70–99)
Glucose-Capillary: 179 mg/dL — ABNORMAL HIGH (ref 70–99)
Glucose-Capillary: 181 mg/dL — ABNORMAL HIGH (ref 70–99)
Glucose-Capillary: 182 mg/dL — ABNORMAL HIGH (ref 70–99)
Glucose-Capillary: 185 mg/dL — ABNORMAL HIGH (ref 70–99)
Glucose-Capillary: 186 mg/dL — ABNORMAL HIGH (ref 70–99)
Glucose-Capillary: 189 mg/dL — ABNORMAL HIGH (ref 70–99)
Glucose-Capillary: 191 mg/dL — ABNORMAL HIGH (ref 70–99)
Glucose-Capillary: 193 mg/dL — ABNORMAL HIGH (ref 70–99)
Glucose-Capillary: 200 mg/dL — ABNORMAL HIGH (ref 70–99)
Glucose-Capillary: 202 mg/dL — ABNORMAL HIGH (ref 70–99)
Glucose-Capillary: 207 mg/dL — ABNORMAL HIGH (ref 70–99)
Glucose-Capillary: 216 mg/dL — ABNORMAL HIGH (ref 70–99)
Glucose-Capillary: 219 mg/dL — ABNORMAL HIGH (ref 70–99)
Glucose-Capillary: 219 mg/dL — ABNORMAL HIGH (ref 70–99)
Glucose-Capillary: 219 mg/dL — ABNORMAL HIGH (ref 70–99)
Glucose-Capillary: 224 mg/dL — ABNORMAL HIGH (ref 70–99)
Glucose-Capillary: 225 mg/dL — ABNORMAL HIGH (ref 70–99)
Glucose-Capillary: 235 mg/dL — ABNORMAL HIGH (ref 70–99)
Glucose-Capillary: 236 mg/dL — ABNORMAL HIGH (ref 70–99)
Glucose-Capillary: 246 mg/dL — ABNORMAL HIGH (ref 70–99)
Glucose-Capillary: 250 mg/dL — ABNORMAL HIGH (ref 70–99)
Glucose-Capillary: 261 mg/dL — ABNORMAL HIGH (ref 70–99)
Glucose-Capillary: 265 mg/dL — ABNORMAL HIGH (ref 70–99)
Glucose-Capillary: 277 mg/dL — ABNORMAL HIGH (ref 70–99)
Glucose-Capillary: 305 mg/dL — ABNORMAL HIGH (ref 70–99)
Glucose-Capillary: 306 mg/dL — ABNORMAL HIGH (ref 70–99)
Glucose-Capillary: 311 mg/dL — ABNORMAL HIGH (ref 70–99)
Glucose-Capillary: 320 mg/dL — ABNORMAL HIGH (ref 70–99)
Glucose-Capillary: 347 mg/dL — ABNORMAL HIGH (ref 70–99)
Glucose-Capillary: 387 mg/dL — ABNORMAL HIGH (ref 70–99)
Glucose-Capillary: 421 mg/dL — ABNORMAL HIGH (ref 70–99)
Glucose-Capillary: 493 mg/dL — ABNORMAL HIGH (ref 70–99)
Glucose-Capillary: 507 mg/dL — ABNORMAL HIGH (ref 70–99)
Glucose-Capillary: 587 mg/dL (ref 70–99)
Glucose-Capillary: 59 mg/dL — ABNORMAL LOW (ref 70–99)
Glucose-Capillary: 65 mg/dL — ABNORMAL LOW (ref 70–99)
Glucose-Capillary: 79 mg/dL (ref 70–99)
Glucose-Capillary: 95 mg/dL (ref 70–99)
Glucose-Capillary: 99 mg/dL (ref 70–99)

## 2010-09-07 LAB — BLOOD GAS, VENOUS
Acid-base deficit: 17.5 mmol/L — ABNORMAL HIGH (ref 0.0–2.0)
Bicarbonate: 9.7 mEq/L — ABNORMAL LOW (ref 20.0–24.0)
O2 Saturation: 94.4 %
Patient temperature: 98.6
TCO2: 9.2 mmol/L (ref 0–100)
pCO2, Ven: 27 mmHg — ABNORMAL LOW (ref 45.0–50.0)
pH, Ven: 7.179 — CL (ref 7.250–7.300)
pO2, Ven: 88.4 mmHg — ABNORMAL HIGH (ref 30.0–45.0)

## 2010-09-07 LAB — URINE MICROSCOPIC-ADD ON

## 2010-09-07 LAB — COMPREHENSIVE METABOLIC PANEL
ALT: 13 U/L (ref 0–35)
AST: 29 U/L (ref 0–37)
Alkaline Phosphatase: 74 U/L (ref 39–117)
GFR calc Af Amer: >60 mL/min (ref >60)
Glucose, Bld: 221 mg/dL — ABNORMAL HIGH (ref 70–99)
Potassium: 2.6 mEq/L — CL (ref 3.5–5.1)
Sodium: 135 mEq/L (ref 135–145)
Total Protein: 7.4 g/dL (ref 6.0–8.3)

## 2010-09-07 LAB — DIFFERENTIAL
Basophils Absolute: 0 10*3/uL (ref 0.0–0.1)
Basophils Absolute: 0.1 10*3/uL (ref 0.0–0.1)
Basophils Relative: 0 % (ref 0–1)
Basophils Relative: 1 % (ref 0–1)
Eosinophils Absolute: 0 K/uL (ref 0.0–0.7)
Eosinophils Absolute: 0.2 10*3/uL (ref 0.0–0.7)
Eosinophils Relative: 0 % (ref 0–5)
Lymphocytes Relative: 7 % — ABNORMAL LOW (ref 12–46)
Lymphs Abs: 1.3 K/uL (ref 0.7–4.0)
Monocytes Absolute: 0.4 10*3/uL (ref 0.1–1.0)
Monocytes Relative: 12 % (ref 3–12)
Monocytes Relative: 3 % (ref 3–12)
Neutro Abs: 15.7 10*3/uL — ABNORMAL HIGH (ref 1.7–7.7)
Neutrophils Relative %: 42 % — ABNORMAL LOW (ref 43–77)
Neutrophils Relative %: 90 % — ABNORMAL HIGH (ref 43–77)

## 2010-09-07 LAB — BASIC METABOLIC PANEL WITH GFR
CO2: 15 meq/L — ABNORMAL LOW (ref 19–32)
Calcium: 10 mg/dL (ref 8.4–10.5)
Creatinine, Ser: 1.7 mg/dL — ABNORMAL HIGH (ref 0.4–1.2)
Glucose, Bld: 528 mg/dL — ABNORMAL HIGH (ref 70–99)

## 2010-09-07 LAB — PHOSPHORUS
Phosphorus: 1 mg/dL — CL (ref 2.3–4.6)
Phosphorus: 2 mg/dL — ABNORMAL LOW (ref 2.3–4.6)
Phosphorus: 2.3 mg/dL (ref 2.3–4.6)

## 2010-09-07 LAB — URINE CULTURE
Culture: NO GROWTH
Special Requests: NEGATIVE

## 2010-09-07 LAB — LACTIC ACID, PLASMA: Lactic Acid, Venous: 2.6 mmol/L — ABNORMAL HIGH (ref 0.5–2.2)

## 2010-09-07 LAB — TYPE AND SCREEN: Antibody Screen: NEGATIVE

## 2010-09-07 LAB — CORTISOL: Cortisol, Plasma: 4.9 ug/dL

## 2010-09-07 LAB — CLOSTRIDIUM DIFFICILE EIA

## 2010-09-07 LAB — FECAL LACTOFERRIN, QUANT

## 2010-09-07 LAB — PROTIME-INR: Prothrombin Time: 14.7 seconds (ref 11.6–15.2)

## 2010-09-07 LAB — MAGNESIUM
Magnesium: 1.5 mg/dL (ref 1.5–2.5)
Magnesium: 1.7 mg/dL (ref 1.5–2.5)

## 2010-09-07 LAB — HEPATIC FUNCTION PANEL
Albumin: 2.8 g/dL — ABNORMAL LOW (ref 3.5–5.2)
Bilirubin, Direct: 0.1 mg/dL (ref 0.0–0.3)
Total Bilirubin: 0.5 mg/dL (ref 0.3–1.2)

## 2010-09-07 LAB — PROCALCITONIN: Procalcitonin: 0.14 ng/mL

## 2010-09-07 LAB — POCT PREGNANCY, URINE: Preg Test, Ur: NEGATIVE

## 2010-09-07 LAB — CLOSTRIDIUM DIFFICILE BY PCR: Toxigenic C. Difficile by PCR: DETECTED — AB

## 2010-09-07 LAB — KETONES, QUALITATIVE

## 2010-09-12 LAB — URINE CULTURE: Culture: NO GROWTH

## 2010-09-12 LAB — DIFFERENTIAL
Basophils Absolute: 0 10*3/uL (ref 0.0–0.1)
Eosinophils Absolute: 0 10*3/uL (ref 0.0–0.7)
Lymphocytes Relative: 18 % (ref 12–46)
Monocytes Absolute: 0.4 10*3/uL (ref 0.1–1.0)
Neutrophils Relative %: 77 % (ref 43–77)

## 2010-09-12 LAB — CBC
HCT: 23.1 % — ABNORMAL LOW (ref 36.0–46.0)
Hemoglobin: 8.3 g/dL — ABNORMAL LOW (ref 12.0–15.0)
Hemoglobin: 9.6 g/dL — ABNORMAL LOW (ref 12.0–15.0)
MCHC: 34 g/dL (ref 30.0–36.0)
MCHC: 34 g/dL (ref 30.0–36.0)
MCV: 122.7 fL — ABNORMAL HIGH (ref 78.0–100.0)
Platelets: 346 10*3/uL (ref 150–400)
Platelets: 472 10*3/uL — ABNORMAL HIGH (ref 150–400)
RBC: 2.01 MIL/uL — ABNORMAL LOW (ref 3.87–5.11)
RDW: 19.5 % — ABNORMAL HIGH (ref 11.5–15.5)
WBC: 3.5 10*3/uL — ABNORMAL LOW (ref 4.0–10.5)

## 2010-09-12 LAB — DIRECT ANTIGLOBULIN TEST (NOT AT ARMC): DAT, IgG: NEGATIVE

## 2010-09-12 LAB — CROSSMATCH: Antibody Screen: NEGATIVE

## 2010-09-12 LAB — BASIC METABOLIC PANEL
BUN: 21 mg/dL (ref 6–23)
BUN: 9 mg/dL (ref 6–23)
CO2: 23 mEq/L (ref 19–32)
Calcium: 9.1 mg/dL (ref 8.4–10.5)
Chloride: 114 mEq/L — ABNORMAL HIGH (ref 96–112)
Creatinine, Ser: 0.95 mg/dL (ref 0.4–1.2)
Creatinine, Ser: 1.17 mg/dL (ref 0.4–1.2)
GFR calc non Af Amer: 51 mL/min — ABNORMAL LOW (ref >60)
Glucose, Bld: 388 mg/dL — ABNORMAL HIGH (ref 70–99)
Potassium: 4.1 mEq/L (ref 3.5–5.1)
Sodium: 136 mEq/L (ref 135–145)

## 2010-09-12 LAB — GLUCOSE, CAPILLARY
Glucose-Capillary: 126 mg/dL — ABNORMAL HIGH (ref 70–99)
Glucose-Capillary: 158 mg/dL — ABNORMAL HIGH (ref 70–99)
Glucose-Capillary: 253 mg/dL — ABNORMAL HIGH (ref 70–99)
Glucose-Capillary: 382 mg/dL — ABNORMAL HIGH (ref 70–99)
Glucose-Capillary: 87 mg/dL (ref 70–99)
Glucose-Capillary: 93 mg/dL (ref 70–99)

## 2010-09-12 LAB — HEPATIC FUNCTION PANEL
ALT: 12 U/L (ref 0–35)
ALT: 16 U/L (ref 0–35)
AST: 15 U/L (ref 0–37)
AST: 16 U/L (ref 0–37)
Albumin: 3.1 g/dL — ABNORMAL LOW (ref 3.5–5.2)
Alkaline Phosphatase: 44 U/L (ref 39–117)
Alkaline Phosphatase: 47 U/L (ref 39–117)
Bilirubin, Direct: 0.3 mg/dL (ref 0.0–0.3)
Indirect Bilirubin: 2.9 mg/dL — ABNORMAL HIGH (ref 0.3–0.9)
Indirect Bilirubin: 3.1 mg/dL — ABNORMAL HIGH (ref 0.3–0.9)
Total Protein: 5.9 g/dL — ABNORMAL LOW (ref 6.0–8.3)
Total Protein: 5.9 g/dL — ABNORMAL LOW (ref 6.0–8.3)

## 2010-09-12 LAB — HAPTOGLOBIN: Haptoglobin: 31 mg/dL (ref 16–200)

## 2010-09-12 LAB — RETICULOCYTES
RBC.: 2.11 MIL/uL — ABNORMAL LOW (ref 3.87–5.11)
Retic Count, Absolute: 76 10*3/uL (ref 19.0–186.0)
Retic Ct Pct: 3.6 % — ABNORMAL HIGH (ref 0.4–3.1)

## 2010-09-12 LAB — PREGNANCY, URINE: Preg Test, Ur: NEGATIVE

## 2010-09-12 LAB — HEMOGLOBIN A1C: Hgb A1c MFr Bld: 8.4 % — ABNORMAL HIGH (ref <5.7)

## 2010-09-12 LAB — FOLATE RBC: RBC Folate: 439 ng/mL (ref 180–600)

## 2010-09-12 LAB — TECHNOLOGIST SMEAR REVIEW

## 2010-09-12 LAB — RAPID URINE DRUG SCREEN, HOSP PERFORMED: Tetrahydrocannabinol: NOT DETECTED

## 2010-09-12 LAB — VITAMIN B12: Vitamin B-12: >2000 pg/mL — ABNORMAL HIGH (ref 211–911)

## 2010-09-13 LAB — GLUCOSE, CAPILLARY
Glucose-Capillary: 129 mg/dL — ABNORMAL HIGH (ref 70–99)
Glucose-Capillary: 184 mg/dL — ABNORMAL HIGH (ref 70–99)
Glucose-Capillary: 44 mg/dL — CL (ref 70–99)

## 2010-09-13 LAB — BASIC METABOLIC PANEL
BUN: 10 mg/dL (ref 6–23)
BUN: 14 mg/dL (ref 6–23)
CO2: 29 mEq/L (ref 19–32)
Calcium: 8.7 mg/dL (ref 8.4–10.5)
Calcium: 9.2 mg/dL (ref 8.4–10.5)
Chloride: 106 mEq/L (ref 96–112)
Creatinine, Ser: 0.69 mg/dL (ref 0.4–1.2)
Creatinine, Ser: 0.8 mg/dL (ref 0.4–1.2)
GFR calc Af Amer: >60 mL/min (ref >60)
GFR calc non Af Amer: >60 mL/min (ref >60)
Potassium: 4.1 mEq/L (ref 3.5–5.1)

## 2010-09-13 LAB — CBC
HCT: 33.7 % — ABNORMAL LOW (ref 36.0–46.0)
MCHC: 33.8 g/dL (ref 30.0–36.0)
MCV: 119.4 fL — ABNORMAL HIGH (ref 78.0–100.0)
Platelets: 250 10*3/uL (ref 150–400)
Platelets: 352 10*3/uL (ref 150–400)
RDW: 12.9 % (ref 11.5–15.5)
RDW: 13.4 % (ref 11.5–15.5)
WBC: 8.8 10*3/uL (ref 4.0–10.5)

## 2010-09-13 LAB — URINALYSIS, ROUTINE W REFLEX MICROSCOPIC
Bilirubin Urine: NEGATIVE
Hgb urine dipstick: NEGATIVE
Nitrite: NEGATIVE
Nitrite: NEGATIVE
Protein, ur: 30 mg/dL — AB
Specific Gravity, Urine: 1.021 (ref 1.005–1.030)
Specific Gravity, Urine: 1.021 (ref 1.005–1.030)
Urobilinogen, UA: 1 mg/dL (ref 0.0–1.0)
Urobilinogen, UA: 2 mg/dL — ABNORMAL HIGH (ref 0.0–1.0)
pH: 6 (ref 5.0–8.0)

## 2010-09-13 LAB — DIFFERENTIAL
Basophils Absolute: 0 10*3/uL (ref 0.0–0.1)
Basophils Absolute: 0 10*3/uL (ref 0.0–0.1)
Eosinophils Absolute: 0 10*3/uL (ref 0.0–0.7)
Eosinophils Absolute: 0.1 10*3/uL (ref 0.0–0.7)
Lymphocytes Relative: 11 % — ABNORMAL LOW (ref 12–46)
Lymphocytes Relative: 27 % (ref 12–46)
Monocytes Absolute: 0 10*3/uL — ABNORMAL LOW (ref 0.1–1.0)
Monocytes Relative: 1 % — ABNORMAL LOW (ref 3–12)
Neutrophils Relative %: 71 % (ref 43–77)
Neutrophils Relative %: 87 % — ABNORMAL HIGH (ref 43–77)

## 2010-09-13 LAB — URINE MICROSCOPIC-ADD ON

## 2010-09-26 LAB — URINALYSIS, ROUTINE W REFLEX MICROSCOPIC
Ketones, ur: >80 mg/dL — AB
Nitrite: POSITIVE — AB
pH: 6 (ref 5.0–8.0)

## 2010-09-26 LAB — COMPREHENSIVE METABOLIC PANEL
AST: 21 U/L (ref 0–37)
Albumin: 4 g/dL (ref 3.5–5.2)
Alkaline Phosphatase: 79 U/L (ref 39–117)
CO2: 25 mEq/L (ref 19–32)
Chloride: 102 mEq/L (ref 96–112)
GFR calc Af Amer: >60 mL/min (ref >60)
Potassium: 4 mEq/L (ref 3.5–5.1)
Total Bilirubin: 2.3 mg/dL — ABNORMAL HIGH (ref 0.3–1.2)

## 2010-09-26 LAB — BASIC METABOLIC PANEL
BUN: 9 mg/dL (ref 6–23)
CO2: 21 mEq/L (ref 19–32)
CO2: 24 mEq/L (ref 19–32)
Calcium: 8 mg/dL — ABNORMAL LOW (ref 8.4–10.5)
Calcium: 8 mg/dL — ABNORMAL LOW (ref 8.4–10.5)
Chloride: 106 mEq/L (ref 96–112)
Creatinine, Ser: 0.78 mg/dL (ref 0.4–1.2)
Creatinine, Ser: 0.86 mg/dL (ref 0.4–1.2)
GFR calc Af Amer: >60 mL/min (ref >60)
GFR calc Af Amer: >60 mL/min (ref >60)
GFR calc non Af Amer: >60 mL/min (ref >60)
GFR calc non Af Amer: >60 mL/min (ref >60)
Glucose, Bld: 153 mg/dL — ABNORMAL HIGH (ref 70–99)
Glucose, Bld: 213 mg/dL — ABNORMAL HIGH (ref 70–99)
Potassium: 3.6 mEq/L (ref 3.5–5.1)
Sodium: 138 mEq/L (ref 135–145)
Sodium: 140 mEq/L (ref 135–145)

## 2010-09-26 LAB — GLUCOSE, CAPILLARY
Glucose-Capillary: 105 mg/dL — ABNORMAL HIGH (ref 70–99)
Glucose-Capillary: 163 mg/dL — ABNORMAL HIGH (ref 70–99)
Glucose-Capillary: 173 mg/dL — ABNORMAL HIGH (ref 70–99)
Glucose-Capillary: 219 mg/dL — ABNORMAL HIGH (ref 70–99)
Glucose-Capillary: 252 mg/dL — ABNORMAL HIGH (ref 70–99)
Glucose-Capillary: 95 mg/dL (ref 70–99)

## 2010-09-26 LAB — HEPATIC FUNCTION PANEL
ALT: 11 U/L (ref 0–35)
Bilirubin, Direct: 0.2 mg/dL (ref 0.0–0.3)
Indirect Bilirubin: 1.4 mg/dL — ABNORMAL HIGH (ref 0.3–0.9)
Total Bilirubin: 1.6 mg/dL — ABNORMAL HIGH (ref 0.3–1.2)

## 2010-09-26 LAB — CBC
HCT: 26.8 % — ABNORMAL LOW (ref 36.0–46.0)
HCT: 31.3 % — ABNORMAL LOW (ref 36.0–46.0)
Hemoglobin: 8.4 g/dL — ABNORMAL LOW (ref 12.0–15.0)
Hemoglobin: 9 g/dL — ABNORMAL LOW (ref 12.0–15.0)
MCHC: 33.4 g/dL (ref 30.0–36.0)
MCV: 114 fL — ABNORMAL HIGH (ref 78.0–100.0)
Platelets: 334 10*3/uL (ref 150–400)
Platelets: 399 10*3/uL (ref 150–400)
RBC: 2.2 MIL/uL — ABNORMAL LOW (ref 3.87–5.11)
RBC: 2.21 MIL/uL — ABNORMAL LOW (ref 3.87–5.11)
RBC: 2.76 MIL/uL — ABNORMAL LOW (ref 3.87–5.11)
RDW: 17.4 % — ABNORMAL HIGH (ref 11.5–15.5)
RDW: 17.6 % — ABNORMAL HIGH (ref 11.5–15.5)
WBC: 3.8 10*3/uL — ABNORMAL LOW (ref 4.0–10.5)
WBC: 6.1 10*3/uL (ref 4.0–10.5)

## 2010-09-26 LAB — URINE CULTURE
Colony Count: NO GROWTH
Culture: NO GROWTH

## 2010-09-26 LAB — DIFFERENTIAL
Basophils Relative: 0 % (ref 0–1)
Eosinophils Absolute: 0.1 10*3/uL (ref 0.0–0.7)
Lymphs Abs: 1.3 10*3/uL (ref 0.7–4.0)
Monocytes Absolute: 0.2 10*3/uL (ref 0.1–1.0)
Neutro Abs: 4.5 10*3/uL (ref 1.7–7.7)

## 2010-09-26 LAB — HEMOGLOBIN A1C
Hgb A1c MFr Bld: 10.5 % — ABNORMAL HIGH (ref 4.6–6.1)
Hgb A1c MFr Bld: 9.3 % — ABNORMAL HIGH (ref 4.6–6.1)
Mean Plasma Glucose: 220 mg/dL
Mean Plasma Glucose: 255 mg/dL

## 2010-09-26 LAB — FECAL LACTOFERRIN, QUANT: Fecal Lactoferrin: POSITIVE

## 2010-09-26 LAB — FOLATE: Folate: >20 ng/mL

## 2010-09-26 LAB — VITAMIN B12: Vitamin B-12: 138 pg/mL — ABNORMAL LOW (ref 211–911)

## 2010-09-26 LAB — IRON AND TIBC
Saturation Ratios: 34 % (ref 20–55)
UIBC: 227 ug/dL

## 2010-09-26 LAB — CLOSTRIDIUM DIFFICILE EIA: C difficile Toxins A+B, EIA: NEGATIVE

## 2010-09-26 LAB — POCT PREGNANCY, URINE: Preg Test, Ur: NEGATIVE

## 2010-09-26 LAB — URINE MICROSCOPIC-ADD ON

## 2010-09-26 LAB — RETICULOCYTES: Retic Count, Absolute: 42.6 10*3/uL (ref 19.0–186.0)

## 2010-09-30 LAB — DIFFERENTIAL
Basophils Absolute: 0 10*3/uL (ref 0.0–0.1)
Eosinophils Absolute: 0 10*3/uL (ref 0.0–0.7)
Monocytes Absolute: 0.6 10*3/uL (ref 0.1–1.0)
Neutrophils Relative %: 84 % — ABNORMAL HIGH (ref 43–77)

## 2010-09-30 LAB — POCT I-STAT, CHEM 8
BUN: 20 mg/dL (ref 6–23)
Calcium, Ion: 1.05 mmol/L — ABNORMAL LOW (ref 1.12–1.32)
Calcium, Ion: 1.07 mmol/L — ABNORMAL LOW (ref 1.12–1.32)
Chloride: 105 mEq/L (ref 96–112)
Creatinine, Ser: 0.9 mg/dL (ref 0.4–1.2)
Glucose, Bld: 235 mg/dL — ABNORMAL HIGH (ref 70–99)
HCT: 38 % (ref 36.0–46.0)
Hemoglobin: 12.9 g/dL (ref 12.0–15.0)
TCO2: 17 mmol/L (ref 0–100)

## 2010-09-30 LAB — URINALYSIS, ROUTINE W REFLEX MICROSCOPIC
Bilirubin Urine: NEGATIVE
Ketones, ur: >80 mg/dL — AB
Nitrite: NEGATIVE
Specific Gravity, Urine: 1.02 (ref 1.005–1.030)
Urobilinogen, UA: 0.2 mg/dL (ref 0.0–1.0)

## 2010-09-30 LAB — BLOOD GAS, ARTERIAL
Drawn by: 317771
TCO2: 14.4 mmol/L (ref 0–100)
pCO2 arterial: 23.4 mmHg — ABNORMAL LOW (ref 35.0–45.0)
pH, Arterial: 7.438 — ABNORMAL HIGH (ref 7.350–7.400)

## 2010-09-30 LAB — COMPREHENSIVE METABOLIC PANEL
AST: 27 U/L (ref 0–37)
Albumin: 4.3 g/dL (ref 3.5–5.2)
CO2: 18 mEq/L — ABNORMAL LOW (ref 19–32)
Calcium: 10.1 mg/dL (ref 8.4–10.5)
Creatinine, Ser: 1.19 mg/dL (ref 0.4–1.2)
GFR calc Af Amer: >60 mL/min (ref >60)
GFR calc non Af Amer: 50 mL/min — ABNORMAL LOW (ref >60)
Total Protein: 9.3 g/dL — ABNORMAL HIGH (ref 6.0–8.3)

## 2010-09-30 LAB — GLUCOSE, CAPILLARY
Glucose-Capillary: 212 mg/dL — ABNORMAL HIGH (ref 70–99)
Glucose-Capillary: 242 mg/dL — ABNORMAL HIGH (ref 70–99)

## 2010-09-30 LAB — CBC
MCHC: 33.6 g/dL (ref 30.0–36.0)
MCV: 102.6 fL — ABNORMAL HIGH (ref 78.0–100.0)
Platelets: 516 10*3/uL — ABNORMAL HIGH (ref 150–400)

## 2010-10-01 LAB — BASIC METABOLIC PANEL
BUN: 14 mg/dL (ref 6–23)
CO2: 19 mEq/L (ref 19–32)
CO2: 22 mEq/L (ref 19–32)
Calcium: 9.1 mg/dL (ref 8.4–10.5)
Chloride: 97 mEq/L (ref 96–112)
Creatinine, Ser: 0.97 mg/dL (ref 0.4–1.2)
Creatinine, Ser: 1.34 mg/dL — ABNORMAL HIGH (ref 0.4–1.2)
GFR calc Af Amer: 53 mL/min — ABNORMAL LOW (ref >60)
GFR calc non Af Amer: >60 mL/min (ref >60)
Glucose, Bld: 115 mg/dL — ABNORMAL HIGH (ref 70–99)
Potassium: 3.8 mEq/L (ref 3.5–5.1)

## 2010-10-01 LAB — GLUCOSE, CAPILLARY
Glucose-Capillary: 112 mg/dL — ABNORMAL HIGH (ref 70–99)
Glucose-Capillary: 155 mg/dL — ABNORMAL HIGH (ref 70–99)
Glucose-Capillary: 216 mg/dL — ABNORMAL HIGH (ref 70–99)

## 2010-10-01 LAB — URINALYSIS, ROUTINE W REFLEX MICROSCOPIC
Bilirubin Urine: NEGATIVE
Glucose, UA: >1000 mg/dL — AB
Ketones, ur: >80 mg/dL — AB
pH: 5.5 (ref 5.0–8.0)

## 2010-10-01 LAB — POCT I-STAT, CHEM 8
Calcium, Ion: 1.29 mmol/L (ref 1.12–1.32)
Creatinine, Ser: 1.1 mg/dL (ref 0.4–1.2)
Glucose, Bld: 426 mg/dL — ABNORMAL HIGH (ref 70–99)
Hemoglobin: 11.9 g/dL — ABNORMAL LOW (ref 12.0–15.0)
Sodium: 133 mEq/L — ABNORMAL LOW (ref 135–145)
TCO2: 18 mmol/L (ref 0–100)

## 2010-10-01 LAB — CBC
MCV: 97.1 fL (ref 78.0–100.0)
Platelets: 427 10*3/uL — ABNORMAL HIGH (ref 150–400)
RDW: 26.1 % — ABNORMAL HIGH (ref 11.5–15.5)
WBC: 7.7 10*3/uL (ref 4.0–10.5)

## 2010-10-01 LAB — DIFFERENTIAL
Basophils Absolute: 0 10*3/uL (ref 0.0–0.1)
Eosinophils Relative: 1 % (ref 0–5)
Monocytes Absolute: 0.3 10*3/uL (ref 0.1–1.0)
Monocytes Relative: 4 % (ref 3–12)
Neutrophils Relative %: 85 % — ABNORMAL HIGH (ref 43–77)

## 2010-10-01 LAB — LIPASE, BLOOD: Lipase: 15 U/L (ref 11–59)

## 2010-10-01 LAB — POCT I-STAT 3, ART BLOOD GAS (G3+)
Acid-base deficit: 6 mmol/L — ABNORMAL HIGH (ref 0.0–2.0)
O2 Saturation: 98 %
Patient temperature: 98.6
TCO2: 19 mmol/L (ref 0–100)
pCO2 arterial: 30.7 mmHg — ABNORMAL LOW (ref 35.0–45.0)

## 2010-10-01 LAB — PREGNANCY, URINE: Preg Test, Ur: NEGATIVE

## 2010-10-01 LAB — URINE MICROSCOPIC-ADD ON

## 2010-11-07 NOTE — H&P (Signed)
NAME:  [name], KEAGY NO.:  [id number]   MEDICAL RECORD NO.:  [id number]          PATIENT TYPE:  EMS   LOCATION:  ED                           FACILITY:  [facility]   PHYSICIAN:  Vania Rea, M.D. DATE OF BIRTH:  March 20, 1966   DATE OF ADMISSION:  06/09/2008  DATE OF DISCHARGE:                              HISTORY & PHYSICAL   PRIMARY CARE PHYSICIAN:  Unassigned.   CHIEF COMPLAINT:  Nausea, vomiting and abdominal pain over the past 24  hours.   HISTORY OF PRESENT ILLNESS:  This is a 45 year old African American lady  with a history of diabetes type 1, who has had no medical follow-up for  some time, but gives herself Lantus insulin daily.  The patient says she  checks her blood sugar twice a day up until the past 3 weeks ago, and it  is usually in the 170s-200s, but she has been out of strips for the past  2-3 weeks.  She denies polyuria or polydipsia.  She has not been feeling  well for about a week and developed severe nausea, vomiting and  abdominal pain over the past 24 hours and came to the emergency room.   The patient denies any fever, cough or cold.  She denies any dysuria or  frequency.  She denies any runny nose or body aches.  She denies any  chest pain.  Her last menstrual period started 2 days ago, it was normal  and came at the appropriate time.  She has no vaginal discharge.   PAST MEDICAL HISTORY:  Other than diabetes none.   MEDICATIONS:  1. Lantus 30 units at bedtime.  2. NovoLog 10-12 units twice daily with meals.   ALLERGIES:  NO KNOWN DRUG ALLERGIES.   SOCIAL HISTORY:  She denies tobacco, alcohol or illicit drug use.  She  works as a Occupational psychologist.   FAMILY HISTORY:  Significant for hypertension in mother, diabetes type 2  in her brother.   REVIEW OF SYSTEMS:  Other than noted above, she denies any problems on a  10-point review of systems.   PHYSICAL EXAMINATION:  GENERAL:  A very ill-looking young African  American lady lying in bed markedly dehydrated and tachypneic.  VITAL SIGNS:  Her temperature is 97.4, pulse is 126, blood pressure  125/63.  She is saturating 100% on 2 liters.  Respiratory rate is 25.  HEENT:  Pupils are round and equal.  Mucous membranes are pink and dry.  NECK:  She has no cervical lymphadenopathy or thyromegaly.  No jugular  venous distention or carotid bruit.  CHEST:  Clear to auscultation bilaterally.  CARDIOVASCULAR:  Tachycardiac.  ABDOMEN:  She has generalized tenderness, but she is soft and there is  no rebound or guarding.  EXTREMITIES:  Without edema.  She has no bony  joint deformities.  CENTRAL NERVOUS SYSTEM:  Cranial nerves II-XII are grossly intact.  She  has no focal neurologic deficit.   LABORATORY DATA:  Blood gas on 2 liters; her pH was 7.01, pCO2 of 31,  pO2 venous 32.  Her O2 sat was 45%  on this venous sample.  Her sodium  was 134, potassium 4.4, chloride 103, CO2 of 9, glucose 535, BUN 18,  creatinine 1.64, calcium 9.4, total protein 8.2, albumin 4.1.  Liver  functions are normal.  Bilirubin 2.3.  Her white count is 21.9,  hemoglobin 11.9, MCV 110, platelet 557.  She has 90% neutrophils.  She  has large platelets present.  Urinalysis; specific gravity 1.023, pH  5.5, greater than 1000 glucose, greater than 80 ketones, nitrite  negative, leukocyte esterase negative.  Urine pregnancy test was  negative.   ASSESSMENT:  1. Diabetic ketoacidosis.  2. Acute renal failure related to dehydration related to the above      disorder.   PLAN:  Vigorous IV fluid hydration with insulin supplementation.  Will  check her serum chemistry every 4 hours until her anion gap has closed  and she her dehydration as resolves.  We will search for a source of  sepsis or other precipitating cause.  Other plans as per orders.      Vania Rea, M.D.  Electronically Signed     LC/MEDQ  D:  06/09/2008  T:  06/09/2008  Job:  [id number]

## 2010-11-07 NOTE — Discharge Summary (Signed)
[name], [name]               ACCOUNT NO.:  [id number]   MEDICAL RECORD NO.:  [id number]          PATIENT TYPE:  INP   LOCATION:  1519                         FACILITY:  [facility]   PHYSICIAN:  Hillery Aldo, M.D.   DATE OF BIRTH:  07-05-1965   DATE OF ADMISSION:  06/09/2008  DATE OF DISCHARGE:  06/14/2008                               DISCHARGE SUMMARY   PRIMARY CARE PHYSICIAN:  None.  The patient will be referred to Dr.  Lonia Blood for hospital followup.   DISCHARGE DIAGNOSES:  1. Diabetic ketoacidosis.  2. Type 1 diabetes.  3. Hypoglycemia.  4. Acute renal failure.  5. Dehydration.  6. Leukocytosis.  7. Leukopenia.  8. Macrocytosis.  9. Iron deficiency anemia.  10.Hypokalemia.  11.Hypophosphatemia.  12.Hypomagnesemia.  13.Diarrhea.  14.Hyperbilirubinemia.  15.Lower extremity edema.  16.Uterine fibroids.  17.Ovarian cyst.   DISCHARGE MEDICATIONS:  1. Lantus 10 units subcutaneously at bedtime; titrate back up to 30      units as blood sugars tolerate.  2. NovoLog sliding scale insulin b.i.d.  3. Nu-Iron 150 mg b.i.d.  4. Lasix 20 mg daily x2 days.  5. Potassium chloride 20 mEq daily x2 days.   CONSULTATIONS:  None.   BRIEF ADMISSION HISTORY OF PRESENT ILLNESS:  The patient is a 45-year-  old female with past medical history of type 1 diabetes who presented to  the hospital with nausea, vomiting and abdominal pain.  Upon initial  evaluation in the emergency department, she was noted to be in diabetic  ketoacidosis.  She was admitted for further evaluation and treatment.  For full details, please see the dictated report done by Dr. Orvan Falconer.   PROCEDURES AND DIAGNOSTIC STUDIES:  1. Chest x-Ray on June 10, 2008:  Status post placement of a      peripherally inserted central catheter showed the PICC line at the      cavoatrial junction with no active cardiopulmonary disease.  2. CT scan of the abdomen and pelvis on June 10, 2008:  No acute      upper  abdominal abnormalities.  Multiple uterine fibroids including      an exophytic 3.3 cm nodule arising from the anterior uterus.  Low      attenuation left adnexal mass, likely ovarian cyst, 3.9 cm greatest      size.  Recommend followup sonographic characterization.      Nonspecific free pelvic fluid.   DISCHARGE LABORATORY VALUES:  Sodium was 140, potassium 3.9, chloride  109, bicarb 28, BUN 3, creatinine 0.57, glucose 71.  White blood cell  count was 2.9, hemoglobin 8.2, hematocrit 34.1, platelets 234.  Phosphorus was 3.8.  Magnesium was 1.8.   HOSPITAL COURSE:  1. Diabetic ketoacidosis:  The patient was admitted and put on high-      volume IV fluid rehydration and an insulin drip.  Her anion gap      closed within 24 hours, and when her glycemic control improved, she      was transitioned over to subcutaneous insulin.  2. Type 1 diabetes/hypoglycemia:  The patient's initial outpatient  regimen was started after she was transitioned to subcutaneous      insulin.  She maintained good glycemic control and until June 11, 2008, when she began to have hypoglycemic episodes.  Her Lantus      was decreased back, and her diet was advanced; despite this, she      continued to have episodes of hypoglycemia.  The patient has been      instructed to reduce her Lantus dose at home until her glycemic      control stabilizes, and then she can titrate her Lantus up based on      her blood sugar readings.  She understands how to do this as she is      a type 1 diabetic and has been diabetic for a long time.  Since she      does not have a primary care doctor, she has been referred to Dr.      Mikeal Hawthorne for primary care and regular management of her type 1      diabetes.  A hemoglobin A1c value was checked.  It was found to be      13 indicating suboptimal outpatient control with a mean plasma      glucose of 326.  She likely has compliance issues as lack of      compliance with her insulin  regimen seems to have precipitated her      episode of DKA when she ran out of testing strips.  3. Acute renal failure/dehydration secondary to DKA:  This is      completely resolved.  4. Leukocytosis/leukopenia:  The patient did have a leukocytosis on      initial presentation.  She also had an absolute neutrophilia      consistent with possible underlying infection.  White count on      presentation was 21.9, and she was empirically covered with Zosyn      given her abdominal pain until appendicitis and cholecystitis could      be completely ruled out.  Her white blood cell count normalized by      June 12, 2008.  Zosyn was completely discontinued after imaging      did not support an intra-abdominal source of infection, and her      abdominal pain improved with treatment of underlying DKA.  She is      now mildly leukopenic and will need a followup CBC at Dr. Maxwell Caul      office when she follows up there.  5. Macrocytosis/iron deficiency anemia:  The patient did have a      macrocytic anemia prompting [place] to check an anemia panel.  There was      no evidence of hemolysis given her haptoglobin of 111.  The anemia      panel showed an iron level of 21 with a normal total iron binding      capacity, normal vitamin B12, and normal serum folate levels.      Ferritin was 13.  Given this, it was felt that her anemia was most      likely due to iron deficiency from menstrual losses.  She was put      on Nu-Iron supplementation therapy and can follow up with Dr. Mikeal Hawthorne      as an outpatient for further evaluation if indicated.  6. Hypokalemia/hypophosphatasemia/hypomagnesemia:  The patient's      electrolytes were fully repleted and normal by the time of  discharge.  7. Diarrhea:  The patient did develop diarrhea and was put on Lomotil.      Reglan was discontinued which had initially been started because of      her nausea and vomiting.  She was also treated with Flora-Q.  Her       diarrhea has now improved.  8. Hyperbilirubinemia:  Completely resolved upon recheck.  No evidence      of homolysis.  9. Lower extremity edema:  Likely secondary to volume overload due to      brisk fluid rehydration for treatment of DKA.  She was given Lasix      20 mg tablets for 2 days along with potassium supplementation      therapy since she was complaining of the ankle swelling been      painful.   DISPOSITION:  The patient is medically stable for discharge.  A followup  appointment has been made for her with Dr. Mikeal Hawthorne at [facility]  on July 01, 2007, at 3:30 p.m.Marland Kitchen  She was given prescriptions for  Lantus, NovoLog, and testing strips.  She also was given prescriptions  for all of her discharge medications.      Hillery Aldo, M.D.  Electronically Signed     CR/MEDQ  D:  06/14/2008  T:  06/14/2008  Job:  [id number]   cc:   Lonia Blood, M.D.

## 2010-11-07 NOTE — Discharge Summary (Signed)
[name], [name]               ACCOUNT NO.:  [id number]   MEDICAL RECORD NO.:  [id number]          PATIENT TYPE:  INP   LOCATION:  3113                         FACILITY:  [facility]   PHYSICIAN:  Theodosia Paling, MD    DATE OF BIRTH:  August 26, 1965   DATE OF ADMISSION:  01/24/2009  DATE OF DISCHARGE:  01/24/2009                               DISCHARGE SUMMARY   PRIMARY CARE PHYSICIAN:  Lonia Blood, MD   ADMITTING HISTORY:  Please refer to the excellent admission note  dictated by Dr. Pearson Grippe under history of present illness.   DISCHARGE DIAGNOSIS:  Diabetic ketoacidosis, now resolved.   SECONDARY DIAGNOSES:  1. History of insulin-dependent diabetes.  2. History of hypertension.  3. History of hyperlipidemia.  4. History of asthma.  5. History of iron-deficiency anemia.   DISCHARGE MEDICATIONS:  The patient is to continue following home  medications:  1. Lantus 30 units subcu at bedtime.  2. NovoLog 10 units prior to each meal.  3. Lisinopril 20 mg p.o. daily .  4. Simvastatin, unknown dose.  No new medications are added.   HOSPITAL COURSE:  Following issues were addressed during the  hospitalization.  1. DKA.  The patient received aggressive hydration.  In addition, the      patient was on insulin drip.  Her bicarbonate at the time of      discharge is 22.  She has remained hemodynamically stable      throughout the hospitalization.  She will follow up with Dr. Mikeal Hawthorne      in 3-4 days' time and will continue home dose of medications.  2. Hypertension.  It stayed controlled with lisinopril.  3. Hyperlipidemia.  Zocor 20 mg was started for her during the      hospitalization.  It was recommended to continue taking Zocor as an      outpatient.  4. She had mild renal insufficiency on admission.  However, at the      time of discharge, her creatinine was 0.97.   CONSULTATION PERFORMED:  None.   PROCEDURE PERFORMED:  None.   IMAGING PERFORMED:  Chest x-ray on January 24, 2009,  which was negative  for acute cardiopulmonary process.   DISPOSITION:  The patient will follow with Dr. Vladimir Creeks in next 3-4 days'  time.  Total time spent in discharge of this patient around 45 minutes.      Theodosia Paling, MD  Electronically Signed     NP/MEDQ  D:  01/24/2009  T:  01/25/2009  Job:  [id number]   cc:   Lonia Blood, M.D.

## 2010-11-07 NOTE — H&P (Signed)
[name], [name]               ACCOUNT NO.:  [id number]   MEDICAL RECORD NO.:  [id number]          PATIENT TYPE:  INP   LOCATION:  3113                         FACILITY:  [facility]   PHYSICIAN:  Massie Maroon, MD        DATE OF BIRTH:  1966/01/27   DATE OF ADMISSION:  01/24/2009  DATE OF DISCHARGE:                              HISTORY & PHYSICAL   PRIMARY CARE PHYSICIAN:  Lonia Blood, M.D.   CHIEF COMPLAINT:  Hyperglycemia.   HISTORY OF PRESENT ILLNESS:  This 45 year old female with type 1  diabetes complains of nausea and vomiting yesterday x4.  There was no  blood.  She has had elevated blood sugars and not been feeling well for  last few days.  She notes that she has been taking her insulin at home.  However, she drank some Corona last night and since then has felt worse.  The patient was brought to the ED for further evaluation.  She was noted  to have a blood sugar of 430.  She is noted to be slightly acidotic and  had ketones greater than 80 in her urine.  The patient was started on  insulin IV in the ED.  The patient will be admitted for DKA.   PAST MEDICAL HISTORY:  1. Diabetes.  2. Hypertension.  3. Hyperlipidemia.  4. Asthma.  5. DKA.  6. Acute renal failure related to dehydration.  7. Iron deficiency anemia.  8. Uterine fibroids.  9. Ovarian cyst.   PAST SURGICAL HISTORY:  Bilateral retinal detachment surgery.   SOCIAL HISTORY:  The patient does not smoke.  The patient occasionally  drinks.  She most recently had some vodka last night.  There is no  history of drug abuse.  She lives at home and is single and has one  child.   FAMILY HISTORY:  Positive for hypertension.  Her brother has type 2  diabetes.   REVIEW OF SYSTEMS:  Negative for fever, chills, cough.  Negative for  abdominal pain.  Negative for diarrhea, constipation, bright red blood  per rectum or black stool, negative for dysuria, negative for hematuria.   ALLERGIES:  No known drug allergies.   MEDICATIONS:  1. Lantus 30 units subcu q.h.s.  2. NovoLog 10 units subcu a.c. meals.  3. Lisinopril.  4. ? Simvastatin.   PHYSICAL EXAMINATION:  Temperature of 98.8, pulse 110, blood pressure  118/73, respiratory rate 22, pulse ox 99% on room air.  HEENT: Anicteric, EOMI, no nystagmus;  pupils 1.5 mm, symmetric, direct,  consensual, near reflexes intact.  Mucous membranes moist.  NECK:  No JVD, no bruit, no thyromegaly, no adenopathy.  HEART:  Regular rate and rhythm.  S1 and S2, no murmurs, gallops or  rubs.  LUNGS: Clear to auscultation bilaterally.  ABDOMEN: Soft, flat, nontender, nondistended.  Positive bowel sounds.  EXTREMITIES: No cyanosis, clubbing or edema.  DP pulses 2+ bilaterally.  SKIN: No rash.  LYMPH NODES: No adenopathy.  NEUROLOGIC EXAM:  Nonfocal, cranial nerves II-XII intact; reflexes 2+,  symmetric, diffuse with downgoing toes bilaterally, motor strength 5/5  in  all four extremities, pinprick intact.   Chest x-ray:  No acute process.   LABS:  Sodium 130, potassium 3.8, chloride 97, bicarb 19, BUN 17,  creatinine 1.34, calcium 9.5.  ABG:  pH 7.385, pCO2 30.7, pO2 97.  Hemoglobin 11.9.  Lipase 15.  Urine pregnancy test negative.   ASSESSMENT/PLAN:  1. Diabetic ketoacidosis:  The patient will be started on insulin 5      units IV per hour.  We will hydrate aggressively with normal saline      at 200 mL/hr and if sugar is less than 200, we will convert to D5      normal saline.  We will check fingerstick blood sugars every hour.      Will check a BMP q.4 hours to start.  Her DKA was probably      triggered by drinking alcohol.  2. Hyperlipidemia:  The patient is not sure what cholesterol medicine      she is on.  Physicians in the morning can find out what medication      and dose she is taking.  Until then we will start her on      Simvastatin 20 mg p.o. q.h.s.  3. Hypertension:  The patient is on lisinopril from what it sounds      like, but she cannot  recollect completely.  The patient be started      on lisinopril 10 mg p.o. daily for hypertension.  4. Mild renal insufficiency:  Will hydrate with normal saline.  We      will repeat a CMP in a.m.  5. Anemia:  The patient can have outpatient workup of anemia.  6. Deep vein thrombosis prophylaxis:  SCDs and TEDs.      Massie Maroon, MD  Electronically Signed     Massie Maroon, MD  Electronically Signed    JYK/MEDQ  D:  01/24/2009  T:  01/24/2009  Job:  [id number]

## 2010-11-10 NOTE — Discharge Summary (Signed)
[name], [name]               ACCOUNT NO.:  [id number]   MEDICAL RECORD NO.:  [id number]          PATIENT TYPE:  INP   LOCATION:  0452                         [facility]ACILITY[facility]   PHYSICIAN:  Melissa L. Ladona Ridgel, MD  DATE OF BIRTH:  1965/08/05   DATE OF ADMISSION:  08/01/2004  DATE OF DISCHARGE:  08/05/2004                                 DISCHARGE SUMMARY   DISCHARGE DIAGNOSES:  1.  Nausea, vomiting, and abdominal pain secondary likely to intense      constipation.  The patient was manually disimpacted and administered      multiple cathartics with good success.  We will instruct the patient to      increase her fiber intake, water intake, as well as promote stool      softening should she become further constipated.  2.  Diabetes.  During the initial admission the patient did not show signs      or symptoms of DKA, in fact, she was showing signs of hypoglycemia, and      throughout the hospital course continued to have periods of      hypoglycemia, particularly in the morning, despite decreasing her NPH      and Regular doses.  At the time of discharge I will send her home only      on 7 of NPH twice daily with 7 of Regular standing dose insulin and      instruct her on a sliding scale insulin.  The patient agrees to see Dr.      Barbaraann Barthel early in the week to up titrate her medication.  We did find      that she does have a urinary tract infection which may be driving her      hypoglycemia.  In addition the constipation prevented her from eating      adequately to cover the insulin she was giving herself.  Of note it is      difficult to assess whether the patient truly has a grasp on titrating      her medications according to her medical need.  I will request that Dr.      Luciana Axe consider reeducating the patient in the outpatient setting,      either through the pharmacist or the diabetic education program in town      to help Ms. Eblen understand how her medicine works and  what      adjustments she could make.  3.  Anemia.  The patient was found to be anemic with an MCV of 100.6.  B12      was found to be quite low and therefore she had been started on B12      injection.  I will provide her with a prescription and have spoken with      the pharmacy at [facility] who will dispense her B12 and assist her      with understanding how to give her an injection.  She will likely need      to receive four more doses of loading B12 and then follow up with a  monthly injection.  She will need to follow up hemoglobin and      hematocrit.  4.  The patient does have a borderline lipid profile which will need to be      monitored over the course of time.  I have not started her on a statin      at this time, but will request that her primary care physician please      reevaluate and decide on starting her on a statin of their choice.   DISCHARGE MEDICATIONS:  1.  Vitamin B12 100 mcg total at 0.1 ml subcutaneously injected once daily      x4 more days. Monday, Tuesday, Wednesday, and Thursday.  Then she is to      stop daily injections and give herself a monthly injection starting on      March 1.  2.  Cipro 500 mg p.o. q.12h. x7 days.  We will provide her with five pills      to get her through Monday until she can see her primary care physician.  3.  NPH 7 units twice daily subcutaneously, Regular 7 units twice daily      subcutaneously.  She is to check her blood sugar before meals and at      bedtime and use a sliding scale as follows:  60-100 she requires no      coverage, 100-150 two units subcutaneous Regular insulin, 150-200 four      units subcutaneous Regular insulin, 201-250 six units of subcutaneously      injected Regular insulin, 250-300 eight units of Regular insulin, and      301-350, 10 units.  Greater than 350 she should call her primary care      physician or come to the emergency room.   DIET:  Her diet should be carbohydrate modified with  lots of fiber and she  should drink a lot of water.  She should avoid constipation at all costs.  Recommend Colace 100 mg b.i.d. for constipation.   SPECIAL INSTRUCTIONS:  She is to call her primary care physician if her  sugars remain incredibly high, greater than 300, or very low, less than 80.   FOLLOW UP:  She should make an appointment to see Dr. Barbaraann Barthel Monday or  Tuesday, to follow up with her blood sugars and make adjustments to her  insulin.  She also should pick up some more antibiotics and make an  arrangement for a GI workup with Dr. Barbaraann Barthel as we have found that she is  newly B12 deficient.   HISTORY OF PRESENT ILLNESS:  The patient is a 45 year old African-American  female with known type 1 diabetes, who presented to the emergency room  complaining of nausea, vomiting x3 days.  The patient states that she has  been having difficulty eating, unable to keep anything, even soups down, and  that her blood sugars have been generally hanging between 170-200.  She has  been having recurrent bouts of hypoglycemia according to the emergency room  staff and therefore, she was admitted for further workup.  On admission she  was found to be extremely constipated.  She was disimpacted by hand as well  as given cathartic medication.  She experienced severe abdominal cramping,  but was able to evacuate her bowels appropriately after which she was able  to eat and had no further nausea, vomiting, or abdominal pain.  On the  second day of admission she spiked a low grade temperature and had  trouble  with hypoglycemia.  We rechecked her urinalysis and it showed that she has a  large urinary tract infection, for which she was started on Cipro.  She has  been able to tolerate oral Cipro and despite mildly low blood sugars this  morning, she is able to manage these at home and is requesting to discharge  at this time.  On the day of discharge the patient's vital signs are as follows:   Temperature 99.1, blood pressure 129/78, pulse is 85, respirations  are 20.  She is 100% on room air.  Her blood sugars are 188, 86, 43, and 170  for today.   PHYSICAL EXAMINATION:  GENERAL;  Well-developed, well-nourished, no acute  distress.  HEENT:  Pupils equal, round, and reactive to light.  Extraocular muscles are  intact.  Mucous membranes are moist.  NECK:  Supple.  There is no JVD, no lymph nodes.  No carotid bruits.  CARDIOVASCULAR:  Regular rate and rhythm.  Positive S1/S2.  No S3, S4.  No  murmurs, rubs, or gallops.  ABDOMEN:  Soft, nontender, nondistended, with positive bowel sounds.  EXTREMITIES:  Showed no clubbing, cyanosis, or edema.  NEUROLOGIC:  The patient does appear to be slightly slow in thought  processes and relatively simple in her problem solving ability.  I would  therefore recommend that she have further diabetic education or assistance  from a family member in helping her to make decisions about her blood  glucose.   The patient is deemed stable for discharge to home at this time.  She states  that she is able to manage her glucoses, both high and low.  I have made  recommendations for what I think she should take while she is waiting for  her infection to resolve.   CONDITION ON DISCHARGE:  Stable, but may require for follow-up.  We are  recommending that she see Dr. Barbaraann Barthel first thing Monday or Tuesday.   FOLLOW UP:  Follow-up issues for this patient:  The patient likely needs a  formal GI consult with possible endoscopy to evaluate as a cause for her B12  deficiency and constipation.  The patient also needs to have her insulin  retitrated back to appropriate levels once her urinary tract infection is  improved.      MLT/MEDQ  D:  08/05/2004  T:  08/05/2004  Job:  [id number]   cc:   Fanny Dance. Rankins, M.D.  [address]  [place]  [place] [address]  Fax: [phone]

## 2010-11-10 NOTE — Discharge Summary (Signed)
NAMEFATMATA, LEGERE                           ACCOUNT NO.:  [id number]   MEDICAL RECORD NO.:  [id number]                   PATIENT TYPE:  INP   LOCATION:  5733                                 FACILITY:  [facility]   PHYSICIAN:  Gaspar Garbe, M.D.            DATE OF BIRTH:  [birth year]   DATE OF ADMISSION:  04/05/2003  DATE OF DISCHARGE:  04/08/2003                                 DISCHARGE SUMMARY   DIAGNOSES:  1. Gastroenteritis versus gastroparesis.  2. Urinary tract infection.  3. Diabetes mellitus, type 1, poor control secondary to noncompliance.   SUMMARY OF HOSPITAL COURSE:  The patient is a 45 year old black female last  seen in our office greater than six months ago.  She has been noncompliant  with both visits and her insulin regimen.  She came to the [facility] office on April 05, 2003, and was seen by Dr. Jacky Kindle in my  absence.  She was given Phenergan and Rocephin here in the office, but  continued to have nausea and vomiting.  She was admitted for observation.  She had intractable nausea and vomiting for a period of 24 hours and then  finally the nausea and vomiting came under control.  Blood sugars have been  reasonably controlled.  She was given insulin in the office and had one  episode ranging into the 300s, during which time she had not been given her  sliding scale during the evenings.  She was put on a reduced dose of Lantus  of 30 units, but was still having problems with hypoglycemia during episodes  of fasting during her hospital stay, further illustrating her history of  noncompliance.   The patient was seen in followup and found to have some right lower quadrant  pain.  CT of the abdomen and pelvis was performed, which was benign.  The  patient had her fevers defervesced by April 07, 2003, on Rocephin 1 g per  day.  Her diet was increased to a regular diabetic diet, the night before  discharge being tolerating well.  She was  tolerating breakfast well at the  time of this dictation.  She was discharged in good condition to home.   We had a long discussion about compliance.  The patient admitted to missing  her Lantus at least one day per week and to being afraid of taking her  regular insulin because it would make her go too low.  However, the patient  admits that she has not checked her sugars greater than one time per week.  We had a long discussion regarding the need to check before every meal and  that most likely these low episodes were either not actually low, but her  perception due to her regular sugars running extremely high or that she in  fact had a low because she was low going into the her meal and had no  checked  and not known to hold her regular insulin at that point.  Once again  the need for compliance was reiterated and a brief discussion into  complications of diabetes was undertaken as well with the patient  acknowledging the higher cardiovascular risk, as well as a greater  likelihood of kidney problems with noncompliance.   MEDICATIONS:  1. Lantus insulin 26 units q.h.s.  2. Regular insulin sliding scale of 101-150 2 units, 151-200 3 units, 201-     250 equals 5 units, 251-300 equals 8 units, and 301+ equal 10 units and     check later in two hours.  The patient is to do this before each and     every meal.  She is to test before each and every meal as well.  3. Keflex 250 mg one tablet three times a day x 1 week to complete course.  4. Phenergan 25 mg every four hours as needed for nausea and vomiting.   Prescriptions for all of the above, as well as Accu-Chek compact test strips  were given to the patient at the time of her discharge.   LABORATORIES:  Urine culture showed multiple organisms.  The patient had  been given antibiotics prior to collection of this culture, however.  She  had an HCG test which was negative.  Laboratories on the day of discharge  showed a potassium of 3.4  which was replaced prior to discharge, a BUN and  creatinine of 9 and 0.9, respectively, and a glucose of 160 that morning at  4:15 a.m. after having her insulin held earlier in the evening secondary to  hypoglycemia.  The white count on April 07, 2003, was 9.6 with a platelet  count of 373.   FOLLOWUP:  The patient is to be seen in the office of [facility] by myself on April 16, 2003, at 2:45 p.m.  The patient was made  aware of this appointment prior to discharge.                                                Gaspar Garbe, M.D.    RWT/MEDQ  D:  04/08/2003  T:  04/08/2003  Job:  [id number]

## 2010-11-10 NOTE — Discharge Summary (Signed)
NAMECATRENA, VARI               ACCOUNT NO.:  [id number]   MEDICAL RECORD NO.:  [id number]          PATIENT TYPE:  OBV   LOCATION:  5707                         FACILITY:  [facility]   PHYSICIAN:  Reather Littler, M.D.       DATE OF BIRTH:  1965-12-22   DATE OF ADMISSION:  02/05/2006  DATE OF DISCHARGE:  02/09/2006                                 DISCHARGE SUMMARY   HISTORY:  The patient was admitted with abdominal pain, nausea, vomiting,  and high sugars for about 2 days.  The patient initially had mild diarrhea  and was evaluated in the emergency room with abdominal CT scan and was not  admitted.  The patient apparently has had poor control of her diabetes and  has been noncompliant with follow-up.  The patient has known type 1 diabetes  since several years and also has known retinopathy and history of further  retinal detachment.  The physical examination showed that she was anxious  with pulse 96, blood pressure 154/72.  She had dry, coated tongue.  Her  abdominal exam showed no localized tenderness, but she was keeping the  abdomen was somewhat tight.   HOSPITAL COURSE:  The patient was hydrated, and she was given Lantus insulin  twice a day along with intravenous Protonix, Reglan, and nausea medications.  Dr. Randa Evens saw her in consultation and did not change her management.  Potassium was replenished when her level was 2.9.  Her blood pressure was  treated with antihypertensives, and final impression was that she probably  had some ketoacidosis and anxiety as well as gastroparesis.  She was also  given Lexapro to ease her anxiety.  Lantus insulin was reduced to 33 units  at discharge time since her sugars were markedly improved.   LABORATORY:  Abdominal and pelvic CT showed no findings except a uterine  fibroid and evidence of cholecystectomy.  Abdominal x-ray showed scattered  air and contrast.  Chest x-ray done for PICC line placement showed heart  size normal, lungs clear, PICC  line in the right atrium.  Other labs showed  potassium of 2.9 and on discharge 3.8.  Her triglycerides were 80.  Urinalysis initially showed large blood and ketones as well as protein on  August 14 and on August 17, it showed only 30 mg protein and no other  abnormality.   DISCHARGE DIAGNOSES:  1. Diabetic gastroparesis with dehydration.  2. Abdominal pain secondary to gastroparesis.  3. Partial ileus.  4. Hypokalemia.  5. Poorly controlled diabetes.  6. Diabetic nephropathy with increased blood pressure.  7. Anxiety.   DISCHARGE CONDITION:  Improved.   DISCHARGE INSTRUCTIONS:  Follow diabetic diet.  Check blood sugars 4 times  daily.  She will take Reglan 10 mg q.i.d. and also lisinopril 10 mg a day,  Lantus insulin 33 units daily, and NovoLog insulin as before admission.  Follow-up in the office in 1 week.           ______________________________  Reather Littler, M.D.     AK/MEDQ  D:  02/28/2006  T:  02/28/2006  Job:  [id number]

## 2010-11-10 NOTE — Op Note (Signed)
NAMENATLIE, [name]                           ACCOUNT NO.:  [id number]   MEDICAL RECORD NO.:  [id number]                   PATIENT TYPE:  OIB   LOCATION:  2550                                 FACILITY:  [facility]   PHYSICIAN:  Alford Highland. Rankin, M.D.                DATE OF BIRTH:  1965-11-15   DATE OF PROCEDURE:  09/28/2002  DATE OF DISCHARGE:                                 OPERATIVE REPORT   PREOPERATIVE DIAGNOSES:  1. 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  2. Proliferative diabetic retinopathy, progressive, of the left eye.  3. Vitreous hemorrhage, left eye.   POSTOPERATIVE DIAGNOSES:  1. 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  2. Proliferative diabetic retinopathy, progressive, of the left eye.  3. Vitreous hemorrhage, left eye.   OPERATION/PROCEDURE:  1. Posterior vitrectomy and membrane peel, left eye.  2. Pars plana vitrectomy with Endolaser photocoagulation, left eye.  3. Injection of vitreous substrate - air.   SURGEON:  Alford Highland. Rankin, M.D.   ANESTHESIA:  General endotracheal anesthesia.   INDICATIONS:  The patient is 45 year old woman with neglected proliferative  diabetic retinopathy which has mass progressed vascular proliferation  elsewhere which largely quieted with a neovascular component with a previous  panphotocoagulation. However, the fibrous contracture has finally laid into  a table top detachment and the vision at the time was 20/30 and is now down  to count fingers in this left eye.  She understands the risks of anesthesia  including the rare occurrence of death, as well as to the eye including but  not limited to hemorrhage, infection, scarring, the need for further  surgery, no change in vision, loss of vision, and progression of disease  despite intervention.   DESCRIPTION OF PROCEDURE:  After appropriate signed consent was obtained,  the patient was taken to the operating room.  In  the operating room  appropriate monitoring was followed by mild sedation.  General endotracheal  anesthesia was induced without difficulty.  The left periocular was  sterilely prepped and draped in the usual ophthalmic fashion.  A lid  speculum was applied.  A conjunctival peritomy was fashioned temporally as  well as superonasally.  The 4 mm infusion cannula was then secured 4 mm  posterior to the limbus inferotemporal quadrant, placed in the vitreous  cavity and verified visually.  A superior sclerotomy was then fashioned.  The [employer] was placed in  position with a BIOM attachment.  Core vitrectomy was then begun.  Modified  umbilical technique was then used to remove the dense fibrovascular tissue  on the macular region.  No complications occurred.  Vitreous skirt was made  360 degrees.  At this time endolaser photocoagulation was placed  peripherally.  Fluid-air exchange was then completed.  Retinal was  reattaching nicely.  It appeared that the tractional detachment would settle  down.  At this time vitreous was removed  and the superior sclerotomies were closed with 7-0 Vicryl suture.  The  infusion site was closed with 7-0 Vicryl suture.  Conjunctiva was closed  with 7-0 Vicryl.  Subconjunctival injections of antibiotics were applied.  The patient tolerated the procedure without complications.                                               Alford Highland Rankin, M.D.    GAR/MEDQ  D:  09/28/2002  T:  09/29/2002  Job:  [id number]

## 2010-11-10 NOTE — Consult Note (Signed)
NAMELUCIE, [name]               ACCOUNT NO.:  [id number]   MEDICAL RECORD NO.:  [id number]          PATIENT TYPE:  INP   LOCATION:  5707                         FACILITY:  [facility]   PHYSICIAN:  James L. Randa Evens, M.D. DATE OF BIRTH:  [birth year]   DATE OF CONSULTATION:  DATE OF DISCHARGE:                                   CONSULTATION   We were asked to consult on [name] for abdominal pain.   HPI:  This patient is a 45 year old female who is an insulin dependent  diabetic and reportedly noncompliant.  She had acute onset abdominal pain  and vomiting February 03, 2006 through February 05, 2006.  She last had diarrhea  on February 03, 2006 and has had no bowel movements since.  Currently, the  patient describes her pain as an 8/10 pain across her entire abdomen and  states that it feels very much like it has in the past when her diabetes is  acting up.  She presented to the ER on February 04, 2006 and had a negative  CT scan of her abdomen and pelvis, except for a fibroid on her uterus.  Blood work, at that time, showed some evidence of ketoacidosis, but she was  not admitted and returned home.  The patient reports problems with chronic  constipation.  She has had a bowel movement once every 2 to 2-1/2 weeks,  which she is initiates with a dose of Dulcolax.  Bowel movements are  painful, but not dark or bloody.  Patient reports, in the past, that she ha  been told that she has a slow gastric emptying.   PAST MEDICAL HISTORY:  Is significant for:  1. Ketoacidosis in 2006.  2. History of UTIs.  3. History of panic attacks.   MEDICATIONS:  Include Lantus, NovoLog, Dulcolax p.r.n.   ALLERGIES:  SHE HAS NO KNOWN DRUG ALLERGIES.   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   REVIEW OF SYSTEMS:  Is significant just for a headache.  She also mentioned  that she is just finishing her menstrual cycle.   PHYSICAL  EXAMINATION:  GENERAL:  On physical exam, the patient was sound  asleep and when I woke her she was very lethargic in no apparent distress.  HEENT:  Her eyes were nonicteric.  NECK:  Her neck had no  lymphadenopathy.  No thyromegaly.  LUNGS:  Her lungs were clear to auscultation bilaterally.  HEART:  Her heart was tachycardic, but regular rhythm.  As a matter of fact,  I could hear her heart beat throughout her abdomen.  ABDOMEN:  Her abdomen was soft, very tender left lower quadrant.  Positive  bowel sounds.  No organomegaly.  No bruits.  EXTREMITIES:  Showed no edema.   CBC showed a hemoglobin of 13.2, hematocrit 39.0, white blood cell count  14.3, platelets 441.  Amylase was 119, lipase was 17.   ASSESSMENT/PLAN:  Is to continue Reglan and Protonix as was started by  [name].  We will go ahead and recheck Chem-7,  amylase, lipase and serum labs.  We recommend that we slowly advance diet.  We will follow clinically and  scope if there is no improvement.     ______________________________  Louie Bun, PA    ______________________________  Llana Aliment. Randa Evens, M.D.    MY/MEDQ  D:  02/06/2006  T:  02/06/2006  Job:  [id number]

## 2010-11-10 NOTE — H&P (Signed)
NAMECHERINE, DRUMGOOLE               ACCOUNT NO.:  [id number]   MEDICAL RECORD NO.:  [id number]          PATIENT TYPE:  EMS   LOCATION:  ED                           FACILITY[facility]   PHYSICIAN:  Deirdre Peer. Polite, M.D. DATE OF BIRTH:  Oct 01, 1965   DATE OF ADMISSION:  08/01/2004  DATE OF DISCHARGE:                                HISTORY & PHYSICAL   CHIEF COMPLAINT:  Nausea and vomiting.   HISTORY OF PRESENT ILLNESS:  Ms. [name] is a 45 year old female with a  history of type 1 diabetes, who presents to the ED with complaints of nausea  and vomiting and feeling bad times 2-3 days.  The patient states that she  has not been able to keep much down on her stomach, and she has been trying  to keep down soups, but has been having some difficulty with that.  She  states that her blood sugars have been running around 170-200; however, she  is not able to check them as often as she needs to because of a lack of  funds.  The patient denies any fever or chills.  Denies any chest pain,  shortness of breath.  Denies any abdominal pain, just nausea.  In the ED,  the patient was evaluated and found to have recurrent bouts of hypoglycemia  per the ED staff.  The [facility] hospitalist has been called for evaluation and  possible admission for nausea, vomiting, and dehydration.  At the time of my  evaluation, the patient was alert and oriented x3.  In no apparent distress.  Sugars are greater than 100.  The patient states that her symptoms have  started a couple of days ago, just nauseous and unable to keep anything  down.  On further questioning, the patient states that she does have trouble  with constipation, and states that she had not had a full evacuation of  stool in approximately 2 weeks, just small pellets.  She describes her stool  as hard and firm.  Denies any dysuria, but does report a history of frequent  UTIs.  Admission is deemed necessary for further evaluation and treatment.   PAST  MEDICAL HISTORY:  1.  Significant for type 1 diabetes.  2.  Constipation by history.   MEDICATIONS ON ADMISSION:  1.  Insulin NPH 26 units in the a.m., 14 in the p.m.  2.  Regular insulin 14 units in the a.m., 14 in the p.m.   SOCIAL HISTORY:  Negative for tobacco, alcohol, or drugs.   PAST SURGICAL HISTORY:  Significant for eye surgery in the past related to  diabetic retinopathy.   ALLERGIES:  No known drug allergies.   REVIEW OF SYSTEMS:  As stated in the HPI.  Otherwise, negative.   FAMILY HISTORY:  Mother deceased.  She had cystic fibrosis.  Father with  hypertension.  The patient with 2 brothers and 4 sisters, relatively  healthy; however, she states that 1 sister has some blood problem, but she  is unsure of the details.   PHYSICAL EXAMINATION:  VITAL SIGNS:  Temperature 98.5, blood pressure  122/78, pulse  99, respiratory rate 20, satting 100%.  HEENT:  Within normal limits.  CHEST:  Clear to auscultation.  CARDIOVASCULAR:  Regular.  No S3.  ABDOMEN:  Left lower quadrant tenderness bilaterally.  No mass appreciated.  EXTREMITIES:  No edema.  RECTAL:  Heme negative.  There is hard stool in the rectal vault.  With  palpation of stool, the patient complained of pain.  NEUROLOGIC:  Nonfocal.   LABORATORY DATA:  Urinalysis revealed specific gravity of 1.031, greater  than 1000 glucose, protein 100, __________ and nitrite negative.  BMET -  sodium 131, potassium 3.6, chloride 102, carbon dioxide 28, glucose 107, BUN  13, creatinine 0.9.  No other data is available at this time.   ASSESSMENT:  1.  Nausea and vomiting, probably on the basis of constipation versus      diabetic gastroparesis.  2.  Abdominal pain.  3.  Constipation by history.  4.  Type 1 diabetes.  5.  Hypoglycemia in the ED, resolved.  6.  Dehydration.   RECOMMENDATIONS:  Recommend the patient be admitted to a medicine floor.  Recommend we check a stat abdominal series to rule out obstruction.  Will   start laxatives per rectum, and oral laxatives.  As the patient is a type 1  diabetic, we will continue the sliding scale insulin and Lantus.  Will also  order amylase and lipase to rule out pancreatitis as the cause for her  nausea and vomiting; however, this is unlikely.  It is probably on the basis  of constipation and diabetic gastroparesis.  Will obtain baseline labs for  hemoglobin A1C, lipid panel, and TSH. Will make further recommendations  after review of the above studies.      RDP/MEDQ  D:  08/02/2004  T:  08/02/2004  Job:  [id number]

## 2010-11-10 NOTE — Discharge Summary (Signed)
[name], [name]               ACCOUNT NO.:  [id number]   MEDICAL RECORD NO.:  [id number]          PATIENT TYPE:  INP   LOCATION:  1430                         FACILITY:  [facility]   PHYSICIAN:  Hettie Holstein, D.O.    DATE OF BIRTH:  [birth year]   DATE OF ADMISSION:  02/22/2005  DATE OF DISCHARGE:  02/27/2005                                 DISCHARGE SUMMARY   ADMISSION DIAGNOSIS:  Diabetic ketoacidosis.   DISCHARGE DIAGNOSIS:  Diabetic ketoacidosis, resolved.   ADDITIONAL DIAGNOSES:  1.  Status post retinal detachment surgery at [facility]  2.  Type 1 diabetes.  3.  History of panic attacks.  4.  Status post cholecystectomy.  5.  Resolved renal insufficiency, thought to be predominantly prerenal and      volume depletion.  6.  Obstipation with minimal response to inpatient bowel regimen. At      present, [name] wishes to return home with a home-bound regimen      trial.  7.  Urinary tract infection status post complete course with Cipro this      admission for a total of five days.  8.  Abdominal pain, felt to be secondary to the above, status post      ultrasound evaluation without significant findings.  9.  Hypokalemia, repleted during this admission.   MEDICATIONS AT DISCHARGE:  The patient is to continue her medications as she  was on at home for insulin regimen. It is being resumed as this the most  familiar to her, the b.i.d. regimen with NPH and regular. She stated that  she was taking 26 units of NPH and 14 of R in the morning and 14 of NPH and  14 of R at night. Oral intake is very intermittent at this time. Recommended  that she continue her regimen. However, if she is not eating breakfast, she  is to hold her regular insulin, and if she is not eating dinner, she is hold  her regular insulin at dinner time as well. If her morning blood sugars are  consistently too low and less than 70, I have instructed her to half her  NPH. I have asked her to contact  [facility] for followup this week. She  states that she is going to contact Dr. Remus Blake office for followup this  week. I have tried to contact the office; however, it is closed at this  time. I provided her with the phone number. MiraLax 17 g per dose daily for  the next 3 days or until a bowel movement. She is to resume all of the eye  drops that she was on previously.   DISPOSITION:  As noted above, [name] typically goes to [facility].  However, she does desire to see Dr. Lucianne Muss for management of her diabetes. I  have recommended that she follow up with Dr. Lucianne Muss or [facility] this week  so that close monitoring and control can be achieved with regard to her  diabetes. We have once again requested that she be involved once again with  diabetic education in the outpatient setting.  Her preadmission medications were as follows:  1.  Hyoscine eye drops 0.25% one drop per right eye twice daily.  2.  Ciloxan eye drops 1 drop q.i.d. in the right eye.  3.  Econopred 1% one drop right q.i.d.  4.  Promethazine 25 mg q.4-6h. p.r.n. nausea.  5.  Eye Stream sterile eye wash solution for her right eye p.r.n.   HISTORY OF PRESENT ILLNESS:  For full details, please refer to the H&P as  dictated by Dr. Lonia Blood. However, briefly, [name] is a 45 year old  African-American female with 28 years of type 2 diabetes on home insulin who  had eye surgery at [facility] a couple of days prior to  hospitalization. She did not take insulin for two days because she had  surgery. After that, she started having nausea, vomiting. Stated it got to  the point where she was unable to tolerate food and had some difficulty  breathing and panic attacks. She presented to the emergency room for  evaluation.   HOSPITAL COURSE:  She was admitted to the intensive care unit, provided  generous IV hydration and underwent treatment with IV insulin until her anti-  gap closure. Her course was notable for a  urinary tract infection. Renal  insufficiency then resolved in addition to abdominal pain, status post  inpatient evaluation without significant findings with the exception of  clinically obstipation. The patient had not had bowel movements for five  days previously. She was started on aggressive bowel regimen which she  wishes to conclude in the home setting. She is medically stable for  discharge at present.      Hettie Holstein, D.O.  Electronically Signed     ESS/MEDQ  D:  02/27/2005  T:  02/27/2005  Job:  [id number]   cc:   Reather Littler, M.D.  [address], [address]  [place]  [place] [address]  Fax: [phone]

## 2010-11-10 NOTE — Op Note (Signed)
[name]                           ACCOUNT NO.:  [id number]   MEDICAL RECORD NO.:  [id number]                   PATIENT TYPE:  OIB   LOCATION:  2550                                 FACILITY:  [facility]   PHYSICIAN:  Alford Highland. Rankin, M.D.                DATE OF BIRTH:  1965-07-26   DATE OF PROCEDURE:  03/16/2002  DATE OF DISCHARGE:                                 OPERATIVE REPORT   PREOPERATIVE DIAGNOSES:  1. Retinal detachment, macular region, right eye.  2. Progressive proliferative retinopathy, right eye.  3. Vitreous hemorrhage, right eye.   POSTOPERATIVE DIAGNOSES:  1. Retinal detachment, macular region, right eye.  2. Progressive proliferative retinopathy, right eye.  3. Vitreous hemorrhage, right eye.   PROCEDURES:  1. Posterior vitrectomy with membrane peel.  2. Endolaser with pan photocoagulation, right eye.  3. Internal diathermy/bipolar cautery, right eye for prevention of diabetic     retinopathy.   SURGEON:  Alford Highland. Rankin, M.D.   ANESTHESIA:  General endotracheal anesthesia.   INDICATIONS FOR PROCEDURE:  The patient is a 45 year old woman with  proliferative diabetic retinopathy leading to retinal detachment in macular  region with profound vision loss threatening either more profound vision  loss.  She understands this is an attempt to halt the neovascular process.  She understands this is an attempt to induce quiescence of the proliferative  diabetic retinopathy.  She understands the anesthesia including the rare  occurrence of death, that include but are not limited to hemorrhage,  infection, scarring, need for surgery, change in vision, loss of vision,  progressive disease are mentioned and appropriate signed consent was  obtained.   DESCRIPTION OF PROCEDURE:  The patient was taken to the operating room.  General endotracheal anesthesia induced without difficulty.  The right  periocular region sterilely prepped and draped in the usual ophthalmic  fashion.  Lid speculum applied.  Conjunctival peritomy fashioned temporally  and superonasally.  4 mm infusion cannula was inserted 4 mm posterior to the  limbus and inferotemporal quadrant.  Placement in the vitreous cavity  verified visually.  A superior sclerotomy was then fashioned A Wild  microscope was placed in position with BIOM attachment.  Core vitrectomy was  then begun.  Iatrogenic posterior vitreous detachment was necessary, and  this was carried out with modified en bloc technique using delamination  scissors to remove dense vitreoretinal attachments nasal to the optic nerve,  temporal to the optic nerve, and around the optic nerve. A large neovascular  frond on the optic nerve was left in place as a remnant, but there was no  residual retraction noted on the PM bundle which was significantly distorted  from the previous traction, as well as in the foveal region.  Cautery was  then used to prevent and to limit the amount of postoperative hemorrhage at  the sites of neovascular detachment.   Thereafter, the posterior hyaloid  had been entirely removed anterior to the  equator, and mid peripheral attachments of neovascularization elsewhere were  identified, circumscribed, and relieved of all traction.   At this time, the retina was attached.  Endolaser photocoagulation was then  placed for 360 degrees in areas of no previous treatment peripherally, as  well as around sites of previous tractional detachment.  One row was placed  inside the arcade to protect the macular region.  Hemostasis was  spontaneous.  At this time, the peripheral retina was inspected and found to  be free of retinal tears or holes.  The superior sclerotomies were then  closed with 7-0 Vicryl suture.  The infusion site was closed with 7-0 Vicryl  suture.  Conjunctiva closed with 7-0 Vicryl suture.  Subconjunctival  injections of antibiotics then applied.  The patient tolerated the procedure  well without  complication.  She was awakened from anesthesia and taken to  recovery room after sterile patch and Fox shield had been applied.                                               Alford Highland Rankin, M.D.    GAR/MEDQ  D:  03/16/2002  T:  03/17/2002  Job:  [id number]

## 2010-11-10 NOTE — H&P (Signed)
[name], [name]                           ACCOUNT NO.:  [id number]   MEDICAL RECORD NO.:  [id number]                   PATIENT TYPE:  INP   LOCATION:  0474                                 FACILITY:  [facility]   PHYSICIAN:  Larina Earthly, M.D.                     DATE OF BIRTH:  May 28, 1966   DATE OF ADMISSION:  07/16/2003  DATE OF DISCHARGE:                                HISTORY & PHYSICAL   CHIEF COMPLAINT:  Nausea and vomiting and not taking incision, abdominal  pain.   HISTORY OF PRESENT ILLNESS:  This is a 45 year old African-American female  who has a long history of type 1 diabetes mellitus with poor compliance and  admission for refractory nausea, vomiting, DKA and infection in the past.  She has had poor medical followup with Gaspar Garbe, M.D. and indeed  the last time she was seen was a long time ago.  She presents nausea,  vomiting and constipation along with abdominal discomfort in the setting of  stretching her insulin secondary to financial difficulties.  She has not  checked her capillary blood glucose measurements. She thinks that the last  time she gave herself insulin was approximately 24 hours ago. She presented  to the emergency room where she was given 3 liters of IV fluids, a total of  20 units of IV regular insulin along with antiemetics consisting of Reglan,  Phenergan and Zofran yet she continued to have nausea and vomiting.  Her  serum CO2 on the initial BMET was 16.  ABG was not obtained. CBG decreased  from the mid 400's to 399 and then subsequently to 297 over a 5-6 hour  period of time. I was called to admit the patient given her refractory  nausea and vomiting and probable DKA.   Upon further questioning, the patient states that she usually takes 14 units  of regular and 26 units of NPH in the morning and 14 units of NPH and  regular in the evening; however, again she has been stretching her insulin  and she was very vague as to how much insulin she  was actually taking in the  recent days.   REVIEW OF SYMPTOMS:  Negative for fevers, chills, chest pain, shortness of  breath, cough, headache, nasal congestion, ear pain, sore throat, stiff  neck, dysuria, no musculoskeletal deficits but positive for abdominal pain,  nausea, vomiting and constipation. The patient did have a bowel movement  during her ER visit.   PROBLEM LIST:  Type 1 diabetes mellitus.   SOCIAL HISTORY:  The patient is single, has an 7 year old son, has no  income and no insurance and denies the use of tobacco or alcohol use or  illicit drug use.   FAMILY HISTORY:  Noncontributory.   LABORATORY DATA:  White blood cell count 13.3 with 95% neutrophils,  hemoglobin 12.2, hematocrit 36%, platelet count 446, urinalysis  unremarkable.  Sodium 136, potassium 4.3, chloride 110, serum CO2 16, BUN  17, creatinine 1.0. AST 29, ALT 19, alkaline phosphatase 72, total bilirubin  1.8.  Pregnancy test negative.  Blood acetone negative or small.   PHYSICAL EXAMINATION:  GENERAL:  We have a nauseated African-American female  with a paucity of words who is alert and oriented.  VITAL SIGNS:  Blood pressure 139/70, pulse 107, respirations 24, temperature  97.4 degrees, 100% oxygen saturation on room air.  HEENT:  Sclerae anicteric, extraocular movements intact. There is no  oropharyngeal lesion.  NECK:  Supple. There is no JVD, there is no thyromegaly, there is no  cervical or axillary lymphadenopathy.  LUNGS:  Clear to auscultation bilaterally.  CARDIOVASCULAR:  Regular rate and rhythm without murmurs appreciated.  ABDOMEN:  Reveals a soft, nontender, nondistended abdomen. Bowel sounds are  present.  EXTREMITIES:  Reveals no edema. The pedal pulses are intact. There is no  joint synovitis that is readily apparent. The patient is alert and oriented  x3 and moving all four extremities.   ASSESSMENT/PLAN:  1. Diabetic ketoacidosis in the setting of insulin noncompliance and      financial difficulties paying for her medications.  Provide aggressive IV     fluid support, clear fluid diet and insulin drip until diabetic     ketoacidosis resolves. Will provide empiric Rocephin given her     leukocytosis with left shift. Tylenol antiemetics as needed and add     potassium once potassium is less than 4.0 by BMET.  Certainly based on     numbers and clinical presentation, diabetic ketoacidosis is mild in     nature at this time; however, will need to strongly encourage compliance     with type 1 diabetes medications in the future.  2. Type 1 diabetes uncontrolled. Again will need social work consult to     assist with medication administration and cost albeit based on old     records cost may not be the only issue in this patient's compliance.     Certainly the patient has had longstanding type 1 diabetes and has had     complications of diabetic neuropathy and diabetic retinopathy.                                               Larina Earthly, M.D.    RA/MEDQ  D:  07/16/2003  T:  07/17/2003  Job:  [id number]   cc:   Gaspar Garbe, M.D.  [address]  [place]  [place] [address]  Fax: [phone]

## 2010-11-10 NOTE — Discharge Summary (Signed)
[name], [name]                           ACCOUNT NO.:  [id number]   MEDICAL RECORD NO.:  [id number]                   PATIENT TYPE:  INP   LOCATION:  0474                                 FACILITY:  [facility]   PHYSICIAN:  Gaspar Garbe, M.D.            DATE OF BIRTH:  Jun 26, 1965   DATE OF ADMISSION:  07/16/2003  DATE OF DISCHARGE:  07/19/2003                                 DISCHARGE SUMMARY   DIAGNOSES:  1. Diabetic ketoacidosis.  2. Diabetes mellitus type 1 with neuropathy, retinopathy.  3. Poor medicine compliance.  4. Gastroenteritis versus gastroparesis.  5. Febrile illness, not otherwise specified.   HOSPITAL COURSE:  The patient was admitted on July 16, 2003, and found to  be in diabetic ketoacidosis.  She stated to the admitting physician that she  had been decreasing her use of her insulin because of her inability to  afford it, and stopped checking her sugars frequently.  She was placed on IV  fluids and Glucommander protocol, and had a good response to insulin drip,  and then was turned over to a regimen of Lantus with sliding scale which had  been her usual type of treatment.  The patient had nausea and vomiting and  was started on Reglan, as well as given Phenergan and Zofran for symptoms.  The patient was going to be discharged on July 18, 2003, however, she had  an episode of nausea and vomiting the prior night, and was kept on a clear  liquid diet with an advancement through the morning of July 19, 2003.  She completed a 24 hour period without any nausea or vomiting, was able to  handle four consistent meals without any difficulty.  She had a fever on  July 17, 2003, and even though she had been on empiric Rocephin, was  switched to p.o. Avelox.  Note, that the patient had a negative pregnancy  test at her initial evaluation in the emergency room this hospitalization.  The patient was sent home the morning of July 19, 2003, back in her usual  condition, and told that she needed to follow up with her primary care  physician, Dr. [name]. Tisovec, with whom she had not seen or kept  appointments in the office since her last hospitalization approximately  three months prior.   MEDICATIONS:  1. Lantus 26 units subcu q.h.s.  The patient may pick up samples of this at     Dr. Deneen Harts office, although social work is arranging for medication     coverage for the patient.  2. Regular insulin sliding scale to use 1 unit every 50 above 100 up to 200,     and then 2 units thereafter, repeating if she has a sugar greater than     301.  3. Reglan 5 mg p.o. a.c. and h.s.  4. Phenergan 25 mg q.4-6h. p.r.n. nausea.  5. Avelox 400 mg p.o. daily  x5 days, to complete her antibiotic course.   LABORATORY DATA:  Labs on day of discharge;  Sodium of 135, potassium of  3.7, glucose 124, BUN of 5, creatinine of 0.8.  Calcium of 8.5.  She had an  urinalysis with positive ketones at her admission, no evidence of urinary  tract infection.  Her hCG urine was negative on July 16, 2003, and she  had small acetone initially on ER evaluation with normal LFTs and a slightly  elevated white count of 12.6 initially.   DISCHARGE INSTRUCTIONS:  The patient will be called on July 21, 2003, by  Dr. Deneen Harts office to check on her status and to arrange for a follow up  visit.  I noted to the patient previously that compliance is necessary for  her to have well controlled diabetes as she is already suffering  complications of this, and that further noncompliance could put her at a  higher morbidity/mortality.                                               Gaspar Garbe, M.D.    RWT/MEDQ  D:  07/20/2003  T:  07/20/2003  Job:  [id number]

## 2010-11-10 NOTE — H&P (Signed)
[name], [name]               ACCOUNT NO.:  [id number]   MEDICAL RECORD NO.:  [id number]          PATIENT TYPE:  INP   LOCATION:  5707                         FACILITY:  [facility]   PHYSICIAN:  Reather Littler, M.D.       DATE OF BIRTH:  06-03-1966   DATE OF ADMISSION:  02/05/2006  DATE OF DISCHARGE:                                HISTORY & PHYSICAL   CHIEF COMPLAINT:  Abdominal pain.   HISTORY:  This is a 45 year old juvenile diabetic who presents to the office  with acute onset of nausea and abdominal pain for about 2 days.  The patient  states that she has been having increasing nausea and abdominal pain with  some vomiting also.  The patient says that the pain is primarily prominent  when she has a wave of nausea and also has some vomiting; however, she does  have some persistent pain in the middle of her abdomen for about 2 days.  The patient did have mild diarrhea on Sunday but none since then.  Because  of the persistent and marked nausea, she has not been able to eat and has  had only very little fluids because of the tendency to have vomiting and  abdominal pain.  The patient was evaluated in the emergency room on Sunday  night and had a negative abdominal CT scan.  She did have some evidence of  ketoacidosis, but she was not admitted.   The patient has been quite noncompliant with taking care of her diabetes.  She has not been seen in the office since March.  She does not check her  sugar regularly. She says on Sunday her sugar was 279, and in the ER it was  314.  The patient has had poor control of her diabetes with previous A1c of  11.1% in March.  She says she takes 35 units of Lantus a day, but is  irregular with her NovoLog at mealtimes.  She probably missed her insulin  dosage yesterday and has not taken any Lantus today.  Blood sugar at home  this morning was 290.   PAST HISTORY:  1. She was admitted with ketoacidosis in 2006.  2. She has had a cholecystectomy.  3.  She has a history of urinary tract infection.  4. History of panic attacks.   MEDICATIONS:  She is on insulin as above.   ALLERGIES:  None.   PERSONAL HISTORY:  Showed a nonsmoker.   FAMILY HISTORY:  No history of diabetes or coronary disease.  She has got a  positive family history of hypertension.   REVIEW OF SYSTEMS:  The patient has _________ retinopathy and apparently  also has had retinal detachment.  She has had no hypertension.  She has had  occasional swelling of her feet.  She does have occasional intermittent  abdominal discomfort.  She says her periods are regular and she is just  getting over her period.   PHYSICAL EXAMINATION:  GENERAL:  The patient is alert but rather anxious  from her abdominal pain.  VITAL SIGNS:  Her pulse is 96, blood  pressure 154/72.  HEENT:  She looks dehydrated with a dry coated tongue.  Eyes externally  normal.  No icterus.  NECK:  No mass.  HEART:  Heart sounds normal.  LUNGS:  Clear.  ABDOMEN:  Not distended.  Bowel sounds are appreciated.  She keeps her  abdomen somewhat rigid and does not appear to have any significant localized  tenderness.  No mass palpable.  EXTREMITIES:  Normal.   ASSESSMENT:  The patient's abdominal pain is unexplained.  Her glucose is  only 190 and her chemistry shows a normal CO2 level.  Her A1c is 11.8%,  indicating poor control, and her CBC is normal, indicating no obvious  infection.   PLAN:  The plan would be to hydrate her, to use Lantus twice a day.  Will  check abdominal films and amylase and consider GI consultation if needed.  She will be instructed on better diabetes care.           ______________________________  Reather Littler, M.D.     AK/MEDQ  D:  02/05/2006  T:  02/05/2006  Job:  [id number]

## 2010-11-10 NOTE — H&P (Signed)
[name], [name]                           ACCOUNT NO.:  [id number]   MEDICAL RECORD NO.:  [id number]                   PATIENT TYPE:  INP   LOCATION:  5733                                 FACILITY:  [facility]   PHYSICIAN:  Geoffry Paradise, M.D.               DATE OF BIRTH:  11-25-1965   DATE OF ADMISSION:  04/05/2003  DATE OF DISCHARGE:                                HISTORY & PHYSICAL   CHIEF COMPLAINT:  Nausea and vomiting, side pain.   HISTORY OF PRESENT ILLNESS:  [name] is a 45 year old female with a history  of longstanding diabetes mellitus, type 1, and poor compliance, presenting  at this time with nausea, vomiting, and left-sided pain.  She has  longstanding history of noncompliance and has had multiple admissions for  diabetic ketoacidosis.  She did miss her last office visit with [place] and was  last seen approximately six months ago.  She does not monitor on a regular  basis, and upon questioning, only monitors once or twice weekly at best.  Additionally, she intermittently misses insulin dosing.  With this history  in mind, she relates last evening approximately 8 p.m. onset of nausea,  vomiting, and prior to this about a 48-hour history of left-sided pain and  dysuria.  She has had no bloody emesis or bloody stools and has had normal  bowel movements.  She denies any chest pain, shortness of breath, cough, or  sputum.  She has had no headaches or dizziness.  She did not take her  insulin last evening and did not bother to even check her blood sugar or  administer extra insulin.  She presents to the office as an emergent work-in  with the above-noted symptoms.   In the office, urinalysis confirmed evidence of a urinary tract infection,  and she was administered 1 g Rocephin IM, 25 mg Phenergan, and 6 units of  Humalog subcutaneously.  She was begun on oral liquids and did well for  approximately one hour, and then nausea and vomiting once again ensued.  She  is admitted  for IV fluids and further management.   PAST MEDICAL HISTORY:   ALLERGIES:  No known drug allergies   MEDICATIONS:  1. Lantus insulin 45 units q.h.s.  2. Regular/Humalog sliding scale, but again it is not clear she has taken     this on a regular basis.   MEDICAL ILLNESSES:  1. Diabetes mellitus, type 1, longstanding.  2. Diabetic neuropathy and retinopathy.  3. Probable gastroparesis.  Unclear whether or not this has been confirmed     by gastric emptying.   PAST SURGICAL HISTORY:  None.   FAMILY HISTORY:  Noncontributory.   SOCIAL HISTORY:  The patient is accompanied by family.  She does not smoke  or drink, denies alcohol or drugs.   PHYSICAL EXAMINATION:  VITAL SIGNS:  Temperature 98, blood pressure 140/70,  pulse 90 and regular,  respiratory rate 18.  GENERAL:  She has no evidence of Kussmaul respiration and does not have any  evidence of acidosis.  She is nontoxic appearing, anicteric.  NECK:  No JVD or bruits.  HEENT:  Mucous membranes moist.  LUNGS:  Clear.  CARDIOVASCULAR:  Regular rate and rhythm.  No murmur, gallop, rub, or heave.  ABDOMEN:  Soft, nontender.  Good bowel sounds.  She has left CVA tenderness,  no right CVA tenderness and no suprapubic tenderness.  EXTREMITIES:  No cyanosis, clubbing, or edema.  She is warm distally.  Distal pulses intact.  Joints normal.  NEUROLOGIC:  Nonfocal.  She is alert, awake.  Cortical functioning intact.   ASSESSMENT:  1. Nausea and vomiting with no clinical evidence of diabetic ketoacidosis.  2. Probable pyelonephritis/urinary tract infection.  3. Diabetes mellitus, type 1.  Poor compliance and poor control.  Last     hemoglobin A1C was 10% today.   PLAN:  STAT lab work will be conducted at the hospital; she will be hydrated  with IV fluids.  Frequent sliding scale insulin.  Should lab work be adverse  or show significant acidosis, this may be converted to an insulin drip.                                                 Geoffry Paradise, M.D.    RA/MEDQ  D:  04/05/2003  T:  04/05/2003  Job:  [id number]

## 2010-11-10 NOTE — H&P (Signed)
NAME:  RUSSIE, GULLEDGE               ACCOUNT NO.:  [id number]   MEDICAL RECORD NO.:  [id number]          PATIENT TYPE:  EMS   LOCATION:  ED                           FACILITY:  [facility]   PHYSICIAN:  Lonia Blood, M.D.       DATE OF BIRTH:  10/07/1965   DATE OF ADMISSION:  02/22/2005  DATE OF DISCHARGE:                                HISTORY & PHYSICAL   ADMITTING PHYSICIAN:  [facility].   CHIEF COMPLAINT:  Nausea and vomiting.   HISTORY OF PRESENT ILLNESS:  [name] is a 45 year old African-American  woman with 28 years of diabetes mellitus type I, on home insulin, who had  eye surgery at [facility] two days ago.  She did not  take insulin for those two days because she had surgery.  After that, about  24 hours prior to arrival, she started having nausea, vomiting, and some  other pain.  It got to the point where she was not able to tolerate any food  today.  She also started having difficulty breathing, and she was started to  also have panic attacks.  She came to the emergency room for evaluation.   PAST MEDICAL HISTORY:  1.  Diabetes mellitus type 1.  2.  Retinal detachment and macular degeneration of the right eye, status      post surgery about a week ago.  3.  Status post cholecystectomy.   HOME MEDICATIONS:  Insulin, mixture of NPH 26 units and regular 14 units in  the morning and NPH 14 units and regular 14 units before dinner.   SOCIAL HISTORY:  This patient is single.  She has a child.  She works in  Clinical biochemist.  She does not drink alcohol and does not smoke cigarettes.   FAMILY HISTORY:  Nobody in her family has diabetes.  Her father is alive and  has hypertension.  Her mother passed away from pulmonary fibrosis.   REVIEW OF SYSTEMS:  CONSTITUTIONAL:  Patient denies any fever, weight loss,  or fatigue.  Very poor appetite secondary to nausea.  EYES:  Decreased  vision in the right eye, post surgery.  Good vision in the left eye.  Some  mild pain in the right eye.  EARS, NOSE, AND THROAT:  No earache, no  nosebleeds, no sore throat, no hearing loss.  CARDIOVASCULAR:  No chest  pain, palpitations, dyspnea.  No edema.  No syncope.  RESPIRATORY:  No  cough.  No sputum.  No dyspnea.  No wheezing.  No hemoptysis.  GASTROINTESTINAL:  Positive for nausea and vomiting.  Negative for diarrhea,  constipation, bleeding, dysphagia, odynophagia.  GENITOURINARY:  Patient  denies any dysuria, flank pain, hematuria, incontinence, or nocturia.  MUSCULOSKELETAL:  Denies any joint pain, joint swelling, muscle pain or back  pain.  SKIN:  Denies any jaundice, cyanosis, rash, itching, pallor, or  bruises.  NEUROLOGIC:  Denies any weakness, numbness, vertigo, __________,  speech difficulty.  ENDOCRINE:  Reports polyuria.  No heat or cold  intolerance.  LYMPHATIC:  Denies any lymphadenopathy.  PSYCHIATRIC:  Reports  some  __________, panic attacks.  No depression.   ALLERGIES:  This patient has no known drug allergies.   PHYSICAL EXAMINATION:  VITAL SIGNS:  Upon admission, the patient has a  temperature of 97.4, heart rate of about 100, respirations around 35,  saturating 99% on room air.  GENERAL APPEARANCE:  She appears robust.  Acutely ill.  Alert, oriented to  place, person, and time.  HEENT:  Eyes:  The right eye has some changes post surgery.  The left eye  has a round pupil that is reactive to light.  Ears, nose, and throat:  She  has intact hearing.  Clear pharynx.  No ulceration in the mouth.  NECK:  Without JVD.  No carotid bruits.  RESPIRATORY:  Clear to auscultation without wheezes, rhonchi, or crackles.  CARDIOVASCULAR:  Heart has a regular rate and rhythm without murmurs, rubs  or gallops.  Without edema.  Pulses present.  ABDOMEN:  Soft and nontender.  Nondistended.  Bowel sounds present.  Patient  has no costovertebral angle tenderness.  NEUROLOGIC:  Cranial nerves VII-XII tested and intact.  She has good  strength in all four  extremities.  Reflexes are symmetric.  Sensation is  intact.  PSYCHIATRIC:  She displays intact insight and attention.  She has really a  labile mood.  SKIN:  Dry and without any rashes.   Laboratory values at the time of admission showed a UA positive for glucose.  Positive for ketones.  An ABG showing pH of 7.25, pCO2 18, pO2 113,  bicarbonate 8.  Sodium 139, potassium 5.2, chloride 111, carbon dioxide 8,  BUN 32, creatinine 1.9, glucose 484, total bilirubin 1.8.  Urine pregnancy  was negative.   ASSESSMENT/PLAN:  1.  Diabetic ketoacidosis:  The most likely reason for this is the surgical      procedure combined with not taking the insulin.  The patient displays      symptoms that are fairly suggestive of the above.  Also, the laboratory      values confirmed the diagnosis.  The plan is to admit the patient to the      intensive care unit, start her on an insulin drip to be adjusted through      the glucomander, intravenous fluids at high doses.  Also, symptomatic      treatment for nausea and vomiting will be provided.  The plan is also to      convert this patient in the long run to Lantus and sliding-scale insulin      with meals.  This will be started after the DKA is corrected.  We will      frequently check basic metabolic profiles every two hours and replace      potassium as needed.  2.  Acute renal insufficiency, most likely secondary to dehydration from the      diabetic ketoacidosis:  Will monitor the creatinine and the BUN and will      give intravenous fluids.  3.  Anxiety and panic attacks:  We will use Ativan on a p.r.n. basis.           ______________________________  Lonia Blood, M.D.     SL/MEDQ  D:  02/22/2005  T:  02/22/2005  Job:  [id number]

## 2010-11-10 NOTE — Discharge Summary (Signed)
[name], [name]               ACCOUNT NO.:  [id number]   MEDICAL RECORD NO.:  [id number]          PATIENT TYPE:  OBV   LOCATION:  5707                         FACILITY:  [facility]   PHYSICIAN:  Reather Littler, M.D.       DATE OF BIRTH:  01/06/1966   DATE OF ADMISSION:  02/05/2006  DATE OF DISCHARGE:  02/09/2006                                 DISCHARGE SUMMARY   __________.   Diarrhea the first day with some fever.  It was __________ in the emergency  room.  __________ cholecystectomy and ketoacidosis.   REVIEW OF SYSTEMS:  Adenopathy and pedal edema.   PHYSICAL EXAMINATION:  Showed blood pressure 154/72, pulse 96, __________  dehydrated.  He had lost abdominal __________.   HOSPITAL COURSE:  The patient was started on intravenous fluids, intravenous  Protonix and Reglan.  GI consultation was obtained.  __________.   The patient appeared to have some __________.   Normal __________ 2.9 on February 06, 2006, 3.8 on discharge.  Amylase and  lipase normal.  __________ ketones, proteinuria, or bacteria.  __________  urinalysis __________ protein but no __________.   X-RAYS:  __________.   DISCHARGE DIAGNOSES:  1. Diabetic gastroparesis with dehydration.  2. Abdominal pain secondary to __________.  3. Partial ileus.  4. Marked hypokalemia.  5. Poorly-controlled diabetes.  6. __________ diabetic nephropathy with hypertension.  7. Anxiety.   At time of discharge __________ NovoLog, __________ for a week.  Start  Reglan 10 mg a.c. and h.s. and Prinivil __________ mg daily.  Ativan p.r.n.  Follow up with the office in two weeks.           ______________________________  Reather Littler, M.D.     AK/MEDQ  D:  02/09/2006  T:  02/09/2006  Job:  [id number]

## 2011-01-01 IMAGING — CR DG ABDOMEN ACUTE W/ 1V CHEST
3 series · 3 of 3 positions shown · non-contrast
Comparison: 10/20/2009

CLINICAL DATA: Nausea and vomiting.  Diarrhea.

ACUTE ABDOMEN SERIES (ABDOMEN 2 VIEW & CHEST 1 VIEW)

[w abdomen decub *]
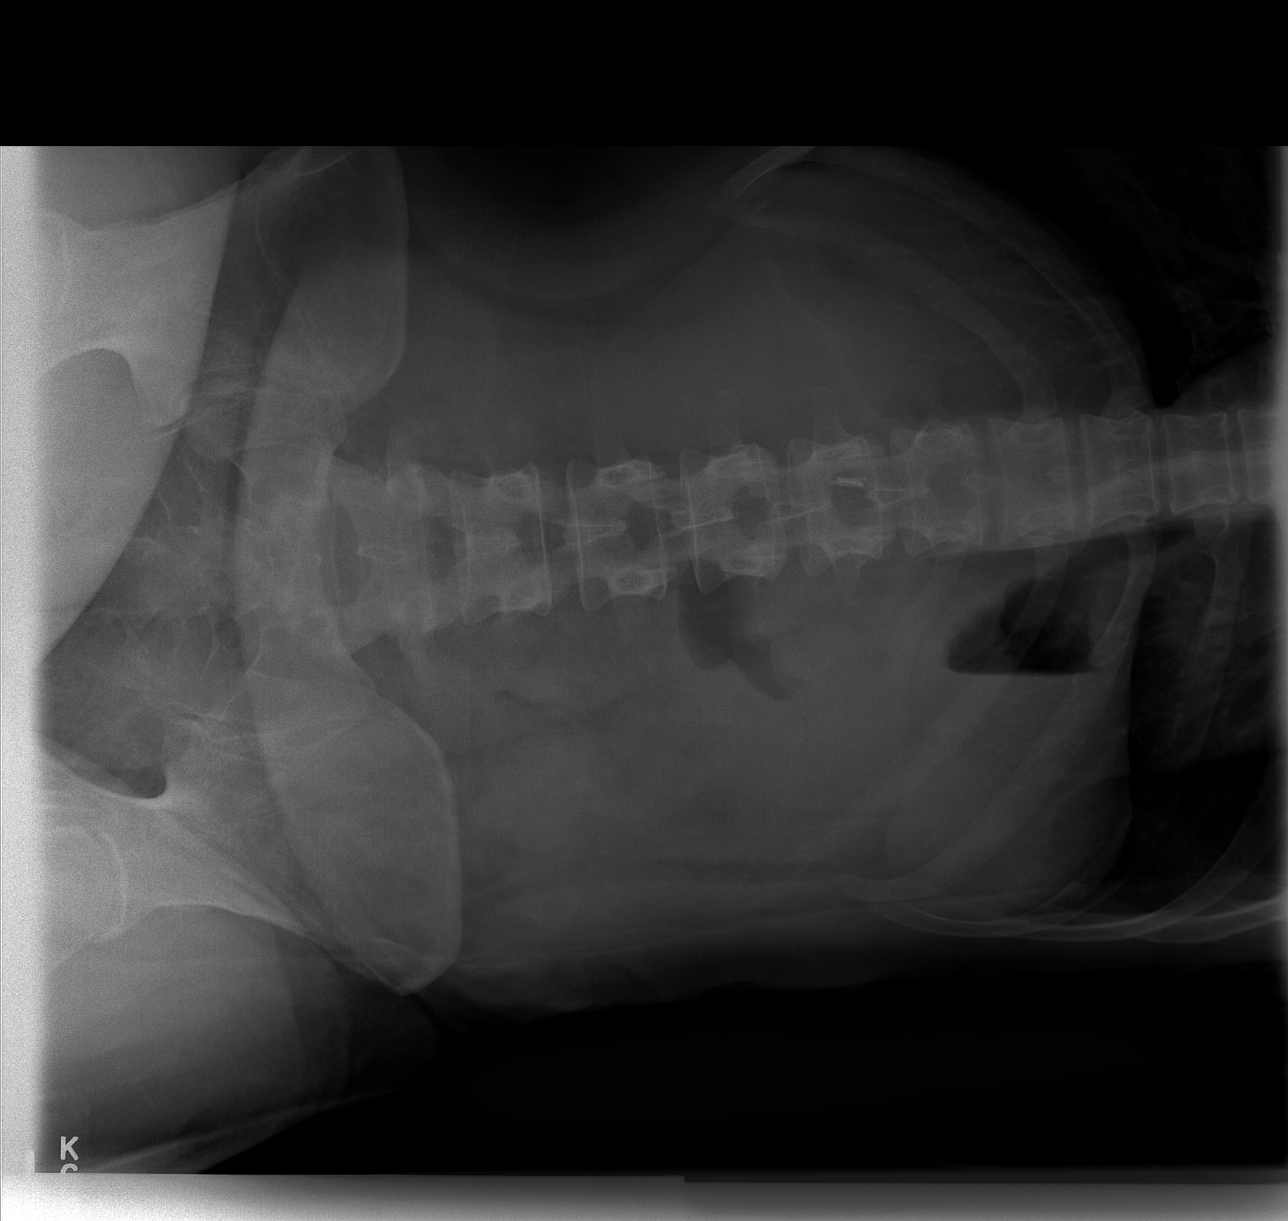

[t abdomen supine]
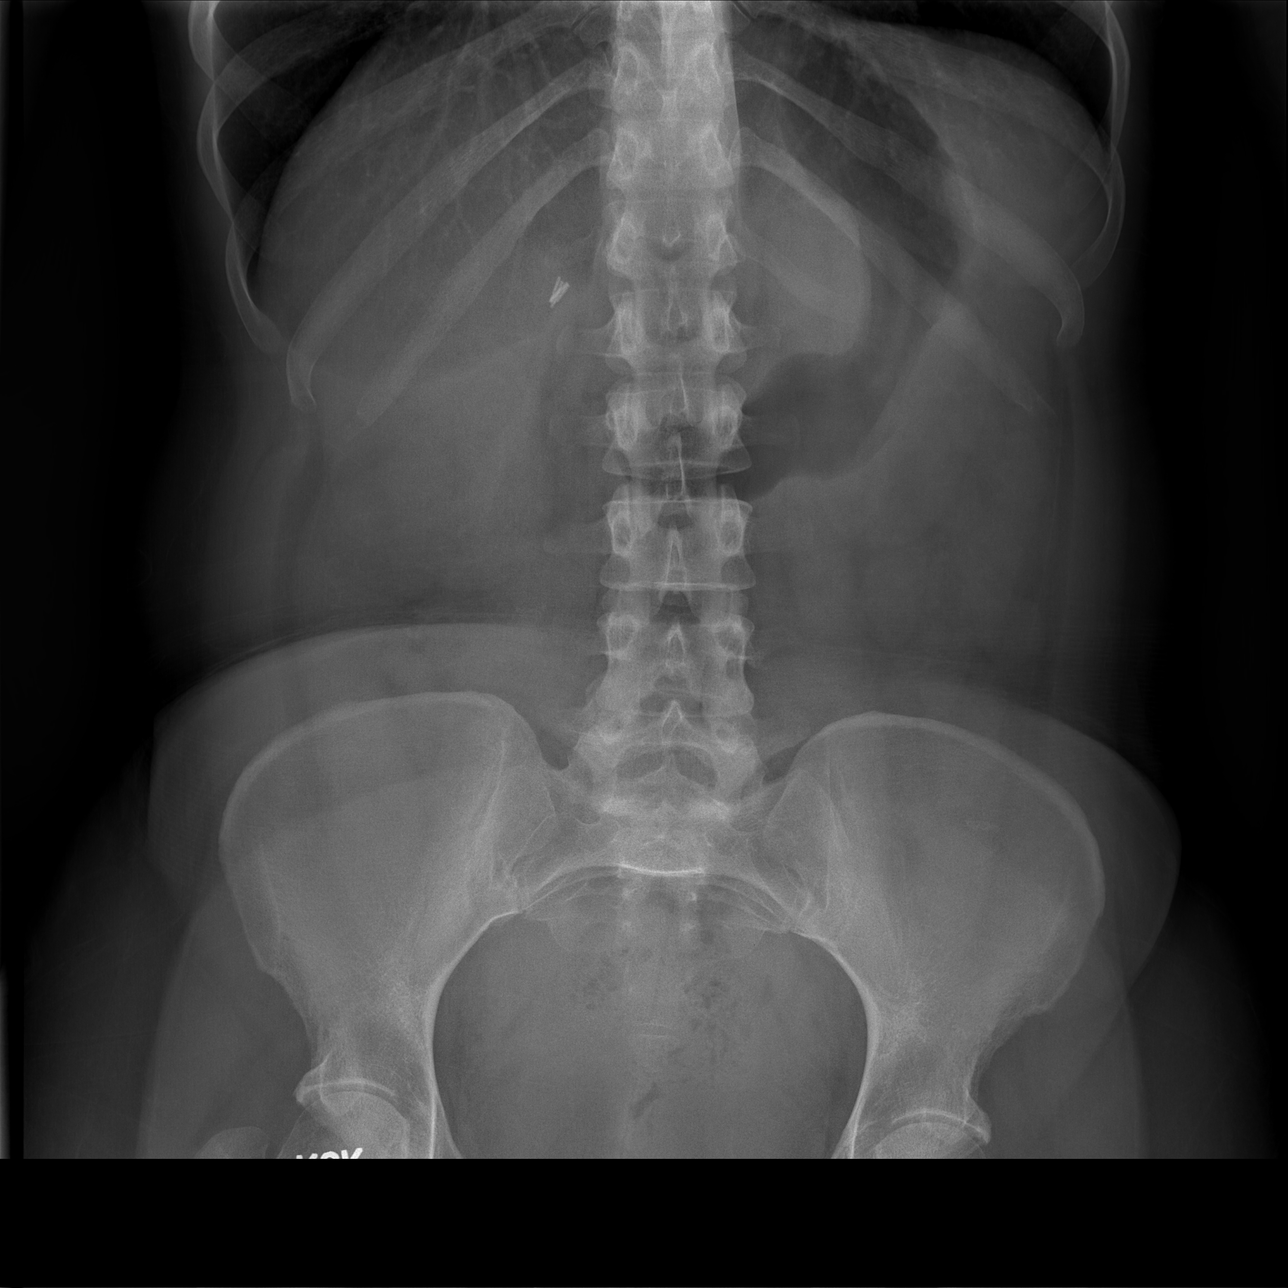

[view not recorded]
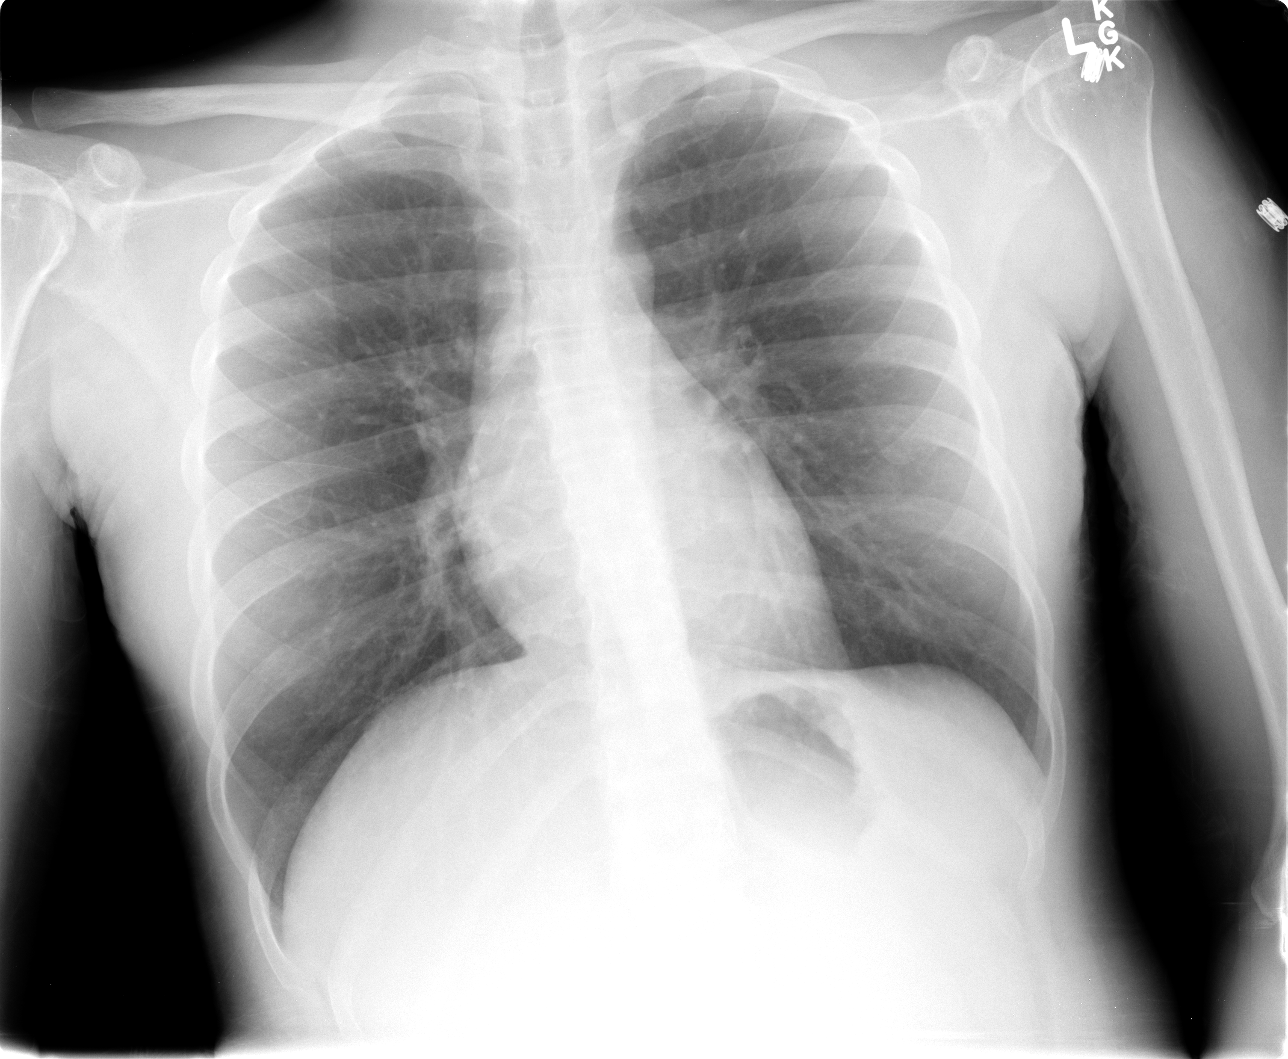

[3 of 3 positions shown; findings below may reference images not displayed]

FINDINGS: Cardiomediastinal silhouette is within normal limits. The
lungs are clear. No pleural effusion.  No pneumothorax.  No acute
osseous abnormality. No free air beneath the diaphragms.

Paucity of bowel gas overall is nonspecific.  No dilated gas filled
loop of bowel is seen. Cholecystectomy clips noted. No abnormal
radiopaque object.  Osseous structures are unremarkable.
IMPRESSION: No acute cardiopulmonary process.

Nonspecific overall paucity of bowel gas.

## 2011-01-02 IMAGING — CT CT ABD-PELV W/O CM
2 of 4 series · 16 of 46 positions shown, 18 images · non-contrast
Comparison: CT of the abdomen and pelvis performed 02/19/2009

CLINICAL DATA: Abdominal pain and diarrhea; acute renal failure and
diabetic ketoacidosis.

CT ABDOMEN AND PELVIS WITHOUT CONTRAST
TECHNIQUE: Multidetector CT imaging of the abdomen and pelvis was
performed following the standard protocol without intravenous
contrast.

[Series 2: abd_pel 5.0 b40f st · axial · 0.60mm/px · z∈[-392,-12]mm · 13 of 84 slices shown, 15 images]
[im 4/84  soft-tissue]
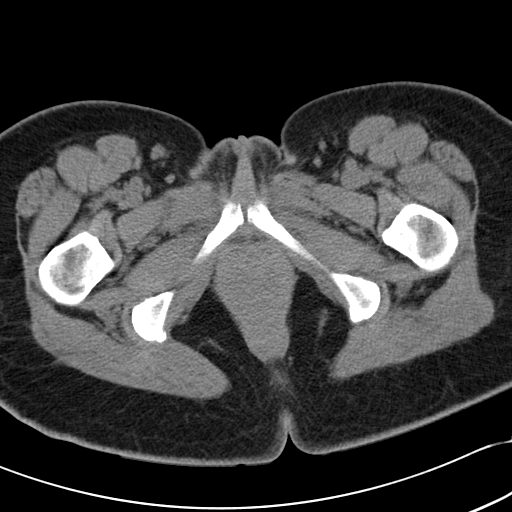
[im 4/84  bone]
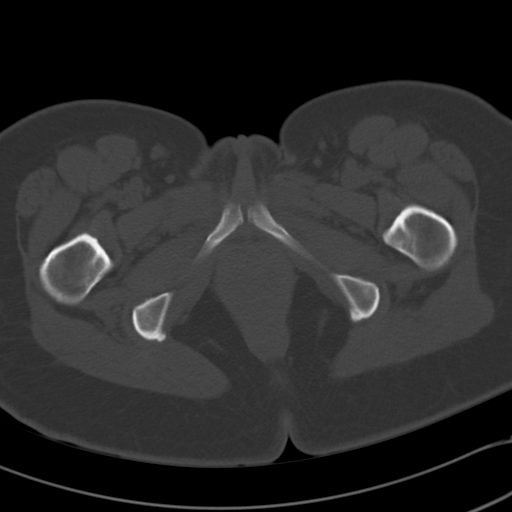
[im 11/84  soft-tissue]
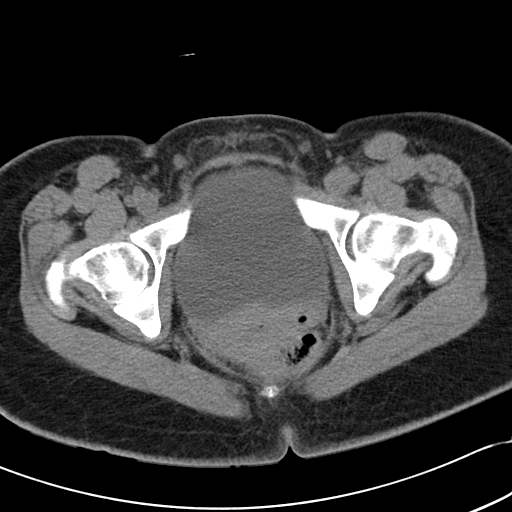
[im 19/84  soft-tissue]
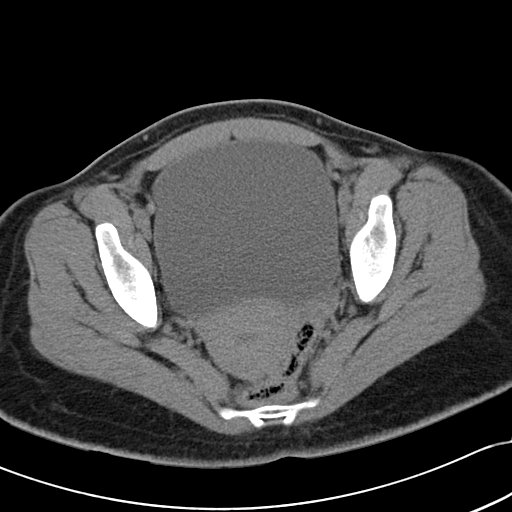
[im 22/84  soft-tissue]
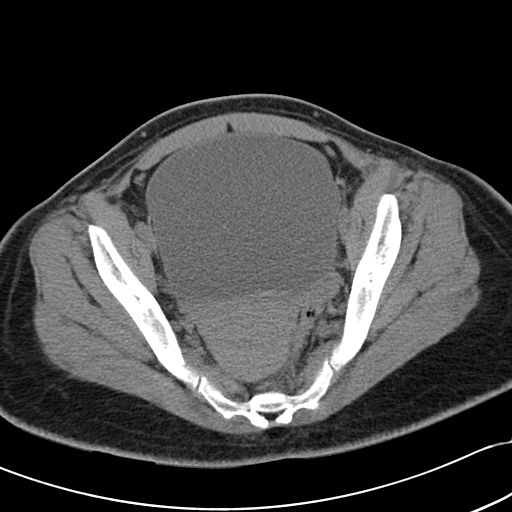
[im 29/84  soft-tissue]
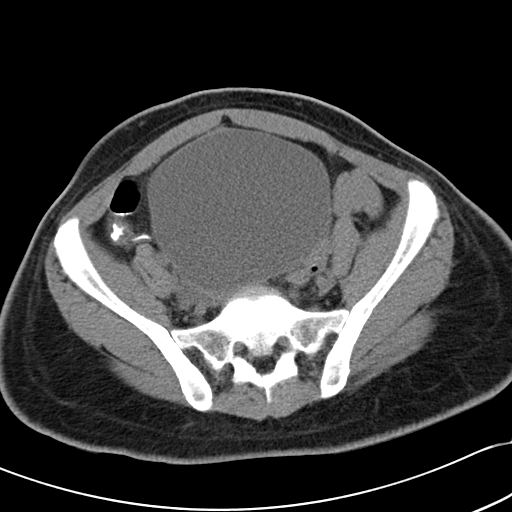
[im 37/84  soft-tissue]
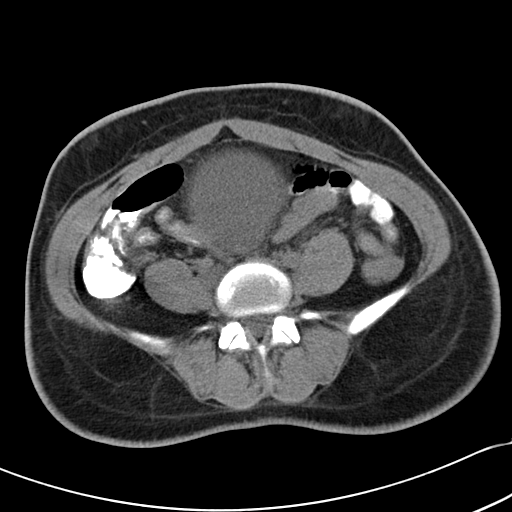
[im 44/84  soft-tissue]
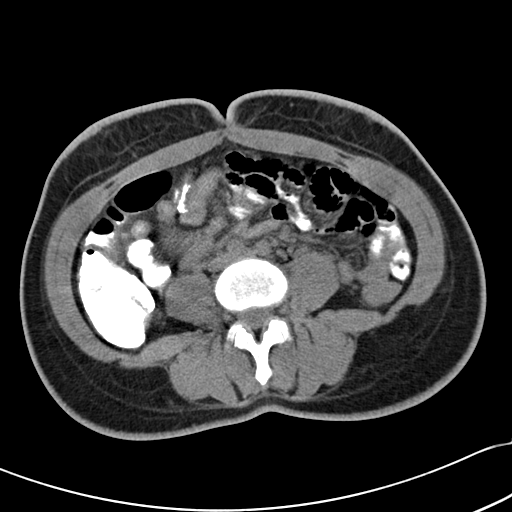
[im 47/84  soft-tissue]
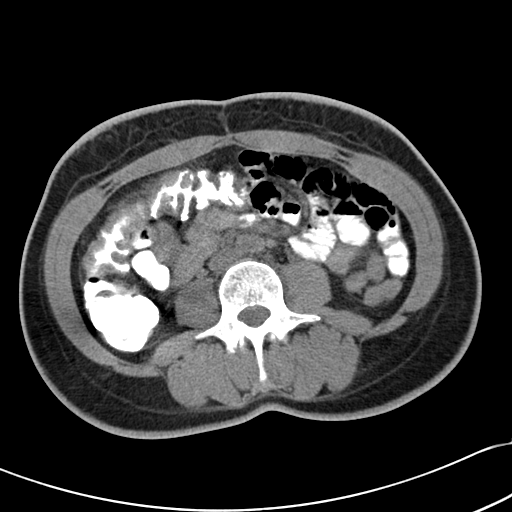
[im 55/84  soft-tissue]
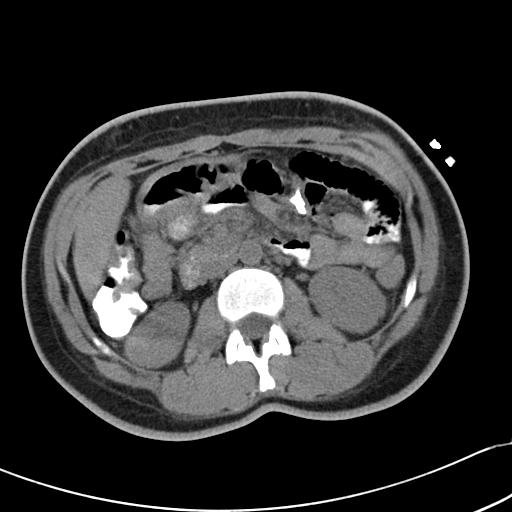
[im 55/84  bone]
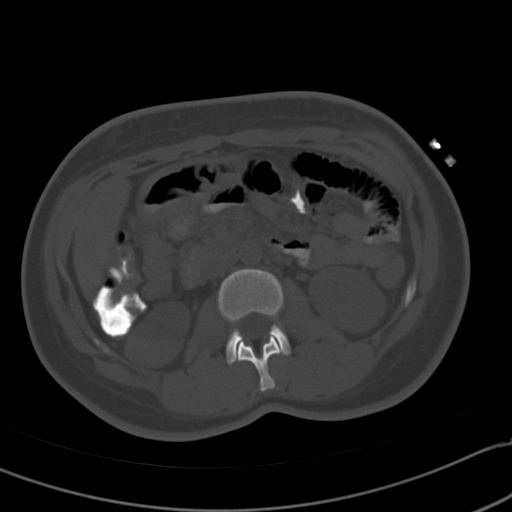
[im 62/84  soft-tissue]
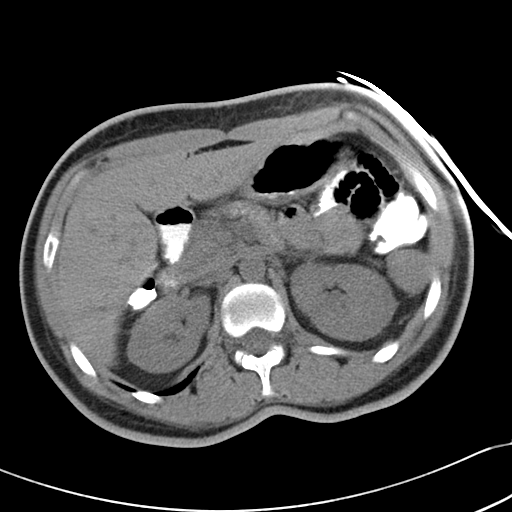
[im 65/84  soft-tissue]
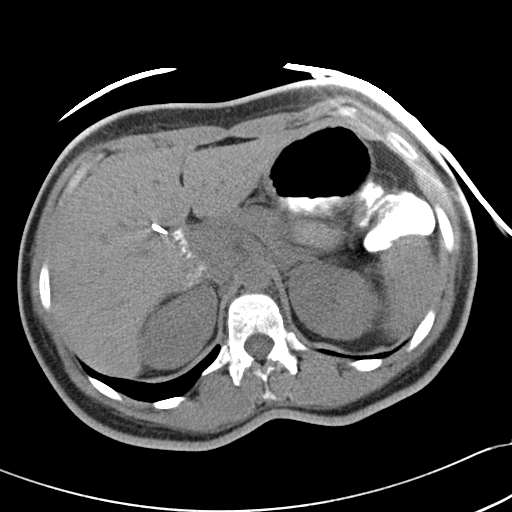
[im 73/84  soft-tissue]
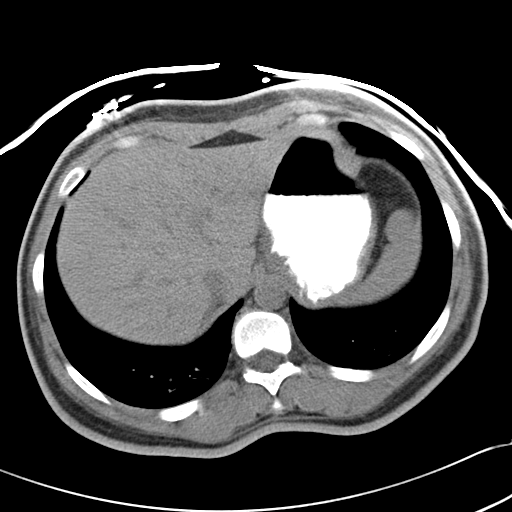
[im 80/84  soft-tissue]
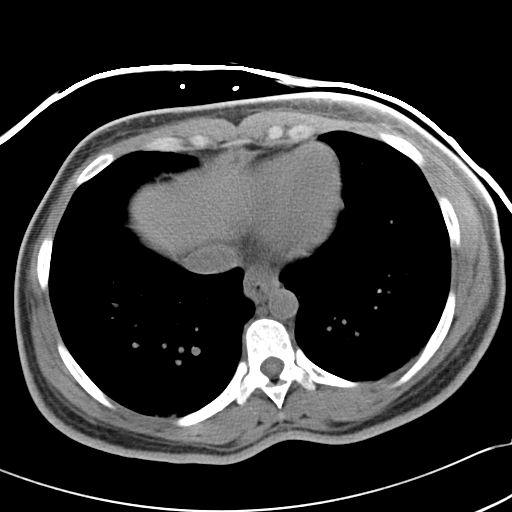

[Series 602: coronal abdomen · coronal · 0.85mm/px · 3 of 103 slices shown]
[im 35/103  soft-tissue]
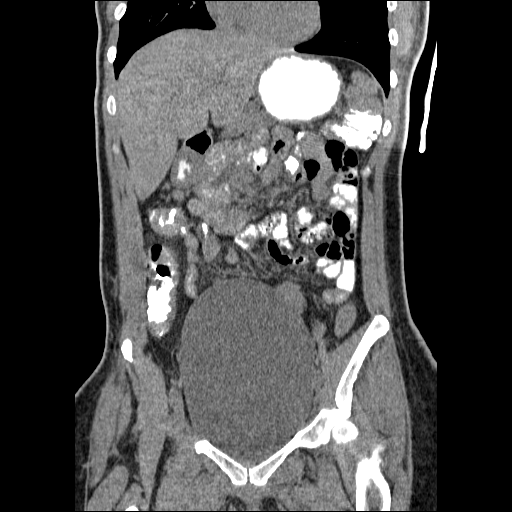
[im 46/103  soft-tissue]
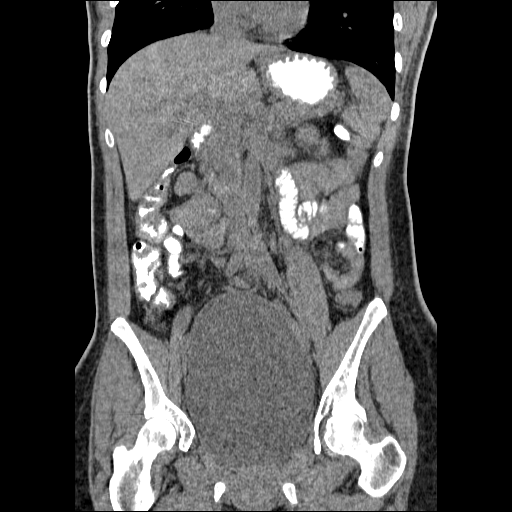
[im 57/103  soft-tissue]
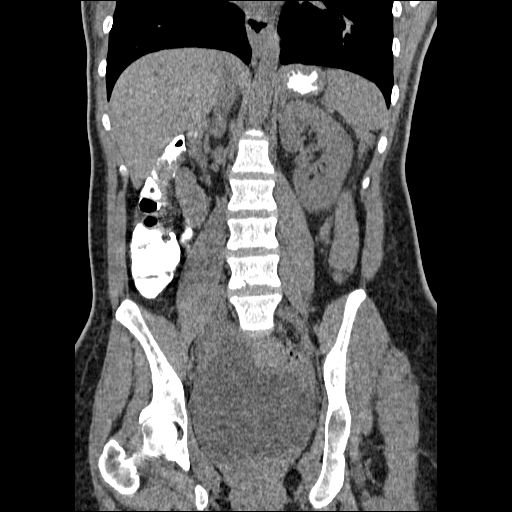

[16 of 46 positions shown; findings below may reference images not displayed]

FINDINGS: Minimal bibasilar atelectasis is noted.

The liver and spleen are unremarkable in appearance.  The patient
is status post cholecystectomy, with a clip noted along the
gallbladder fossa.  The pancreas and adrenal glands are
unremarkable in appearance.

The right kidney is smaller than the left; the kidneys are grossly
unremarkable in appearance.  No significant perinephric stranding
is seen.  No hydronephrosis is appreciated.  No renal or ureteral
stones are identified.

The stomach is unremarkable in appearance.  No definite small bowel
abnormalities are characterized; there is mild stranding within the
mesentery, of uncertain significance.  No acute vascular
abnormalities are seen.

The appendix is normal in caliber and contains contrast, without
evidence for appendicitis.

There is apparent thickening of the wall of the colon along the
distal ascending and proximal transverse colon; this could reflect
mild colitis, though no significant surrounding soft tissue
stranding is seen.  The remainder of the colon is grossly
unremarkable in appearance.

The bladder is markedly distended.  A minimally calcified 2.6 cm
fibroid is noted arising at the fundus.  The uterus is otherwise
unremarkable.  The ovaries are symmetric in appearance.  No
suspicious adnexal masses are seen.  No inguinal lymphadenopathy is
appreciated.

No acute osseous abnormalities are identified.
IMPRESSION: 1.  Apparent thickening of the wall of the colon along the distal
descending and proximal transverse colon, possibly reflecting mild
acute colitis.  No significant surrounding soft tissue inflammation
seen.
2.  Mild nonspecific small bowel mesenteric stranding noted; small
bowel unremarkable in appearance.

3.  Fibroid uterus.
4.  Markedly distended bladder noted.

## 2011-01-05 IMAGING — US US PELVIS COMPLETE MODIFY
1 series · 14 of 25 positions shown · non-contrast
Comparison: 03/18/2010 CT

CLINICAL DATA: Vaginal bleeding

TRANSABDOMINAL AND TRANSVAGINAL ULTRASOUND OF PELVIS
TECHNIQUE: Both transabdominal and transvaginal ultrasound
examinations of the pelvis were performed including evaluation of
the uterus, ovaries, adnexal regions, and pelvic cul-de-sac.
Transabdominal technique was performed for global imaging of the
pelvis.  Transvaginal technique was performed for detailed
evaluation of the endometrium and/or ovaries.

[Series 1: us pelvis complete modify · 0.28mm/px · 14 of 65 slices shown]
[im 1/65]
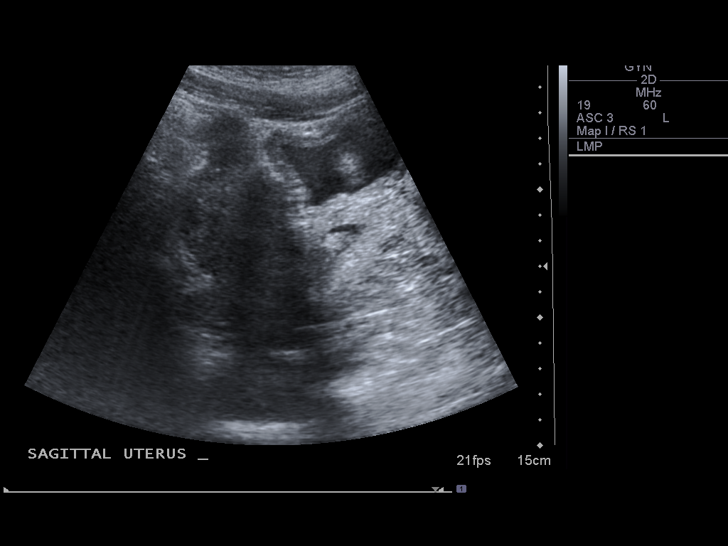
[im 6/65]
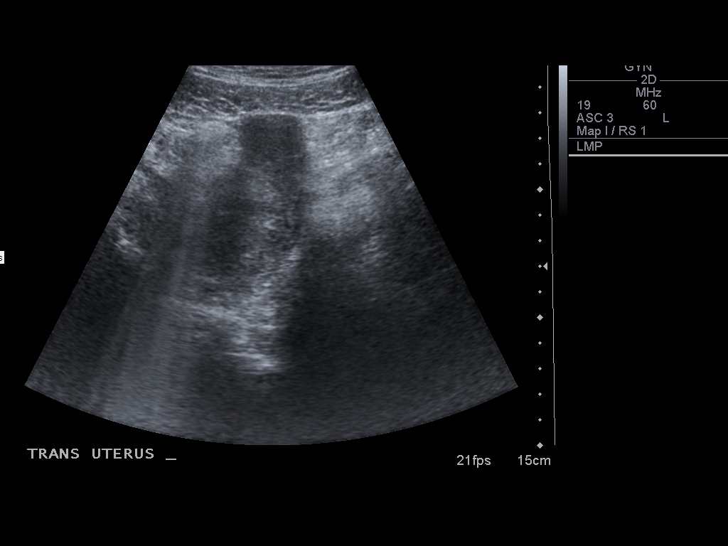
[im 11/65]
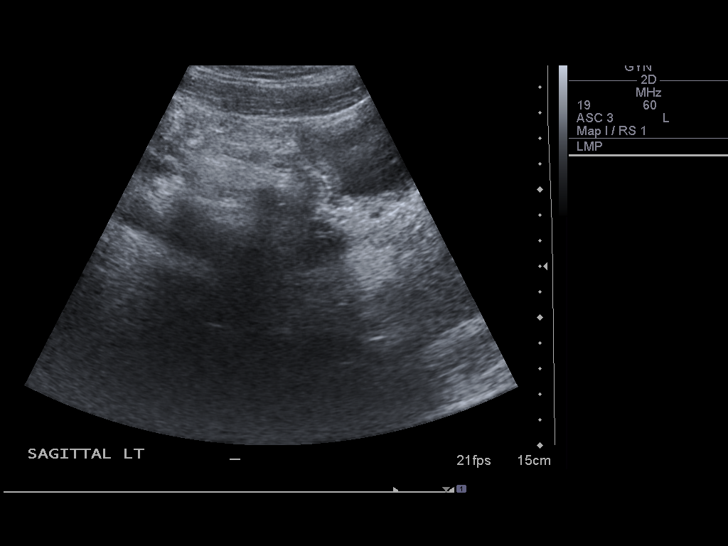
[im 17/65]
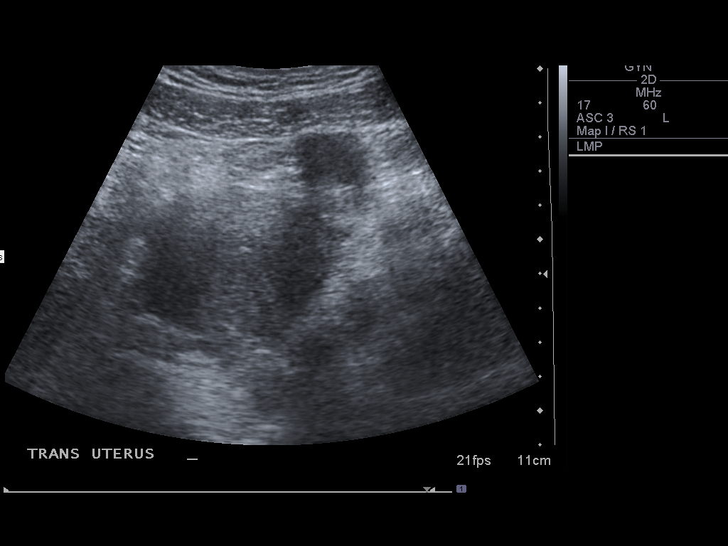
[im 22/65]
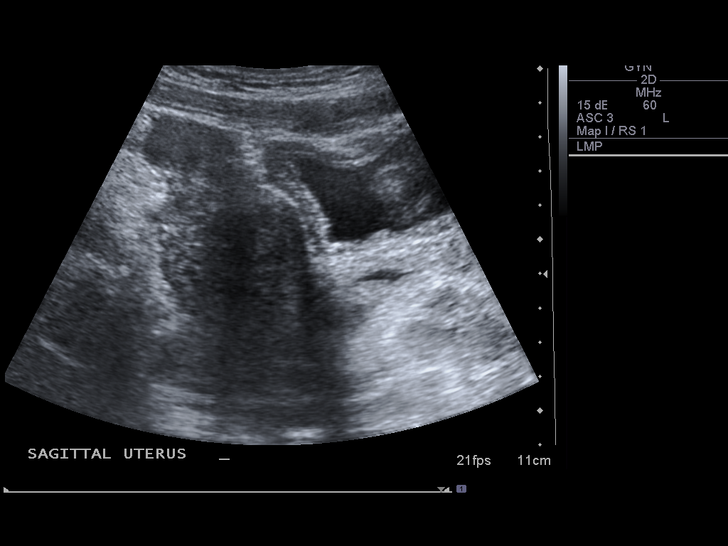
[im 25/65]
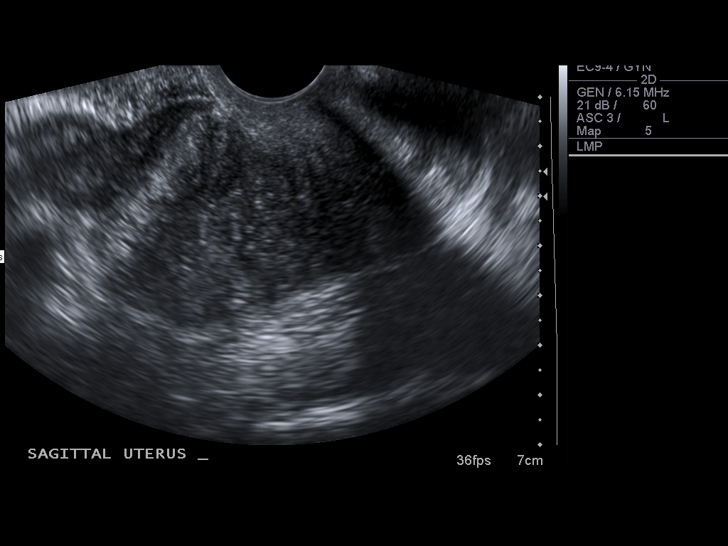
[im 30/65]
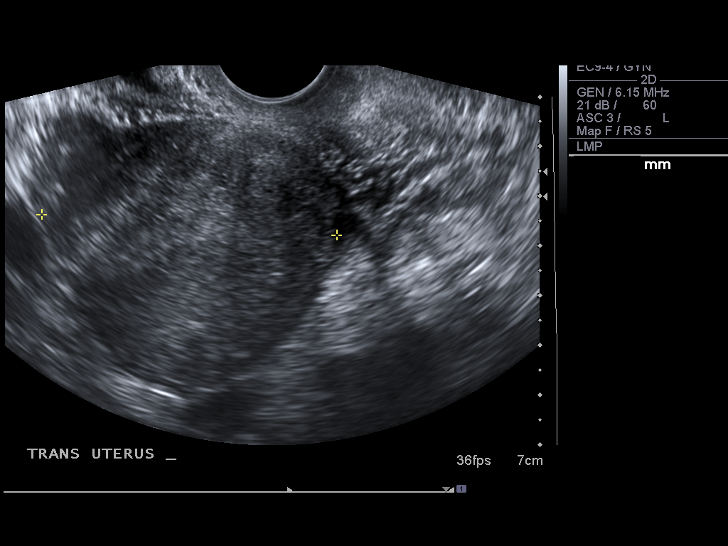
[im 35/65]
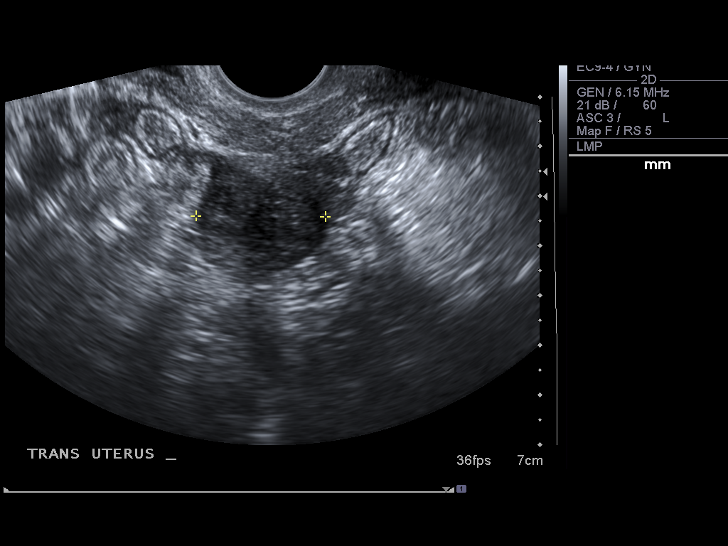
[im 41/65]
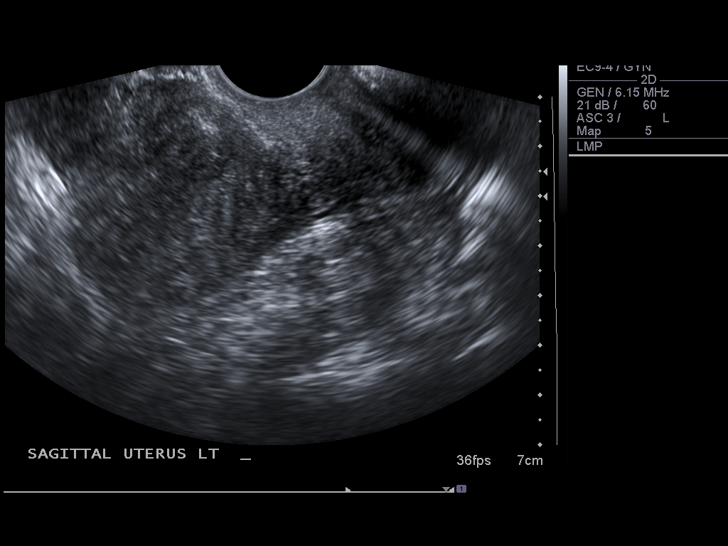
[im 43/65]
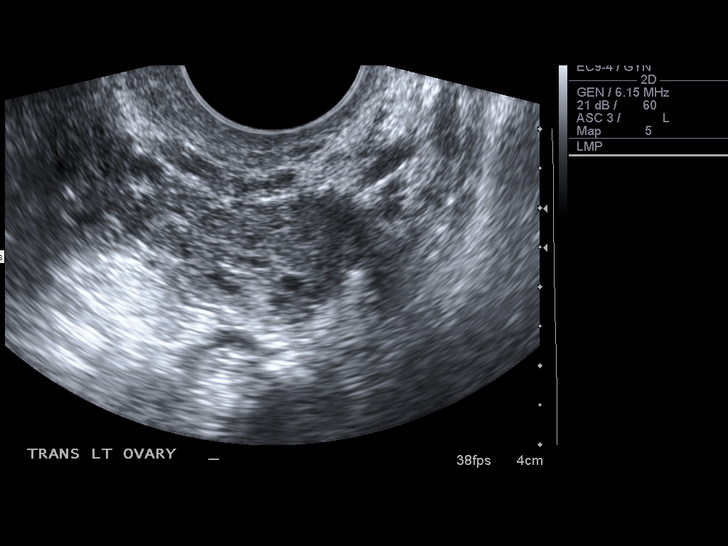
[im 49/65]
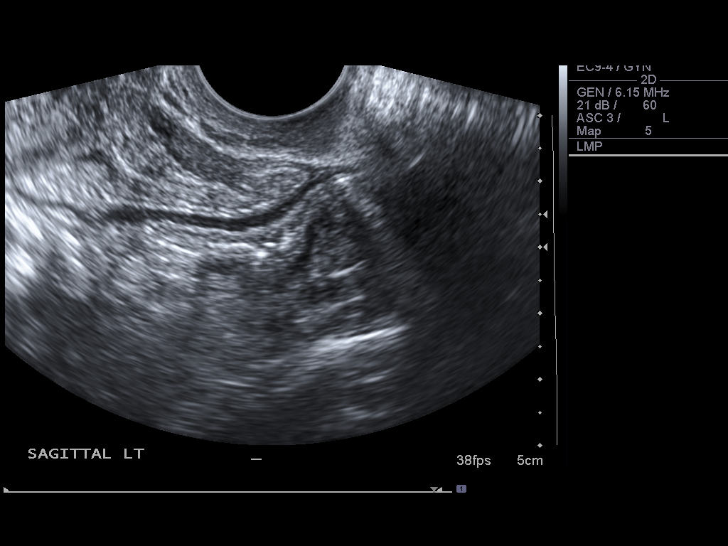
[im 54/65]
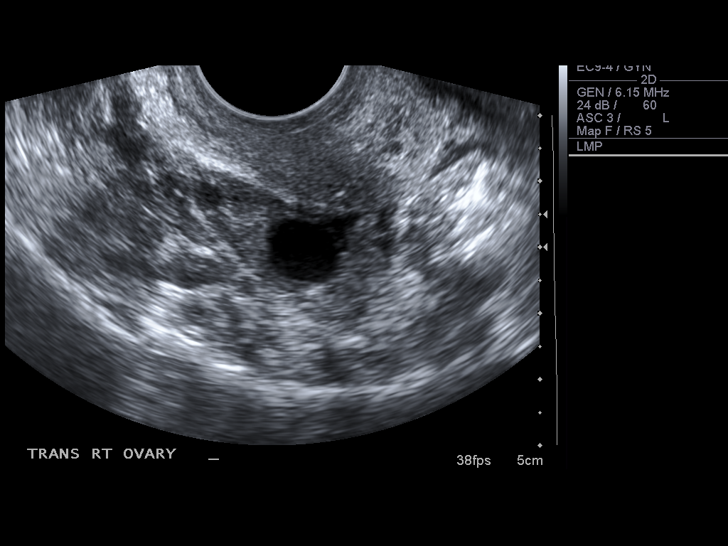
[im 59/65]
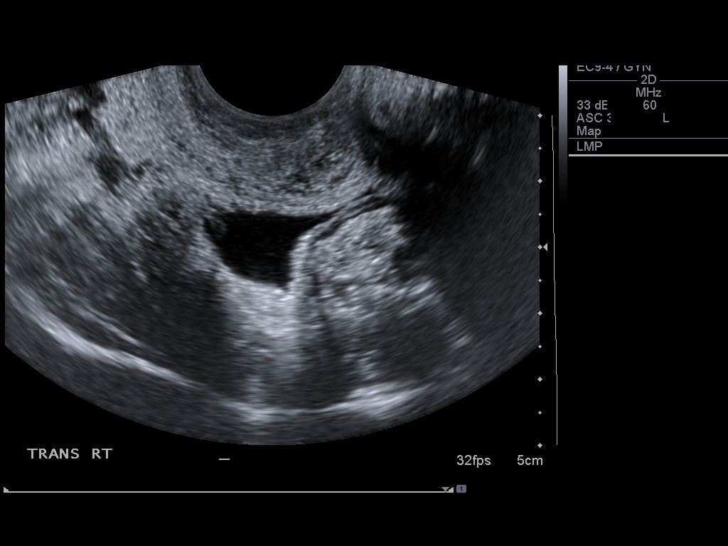
[im 65/65]
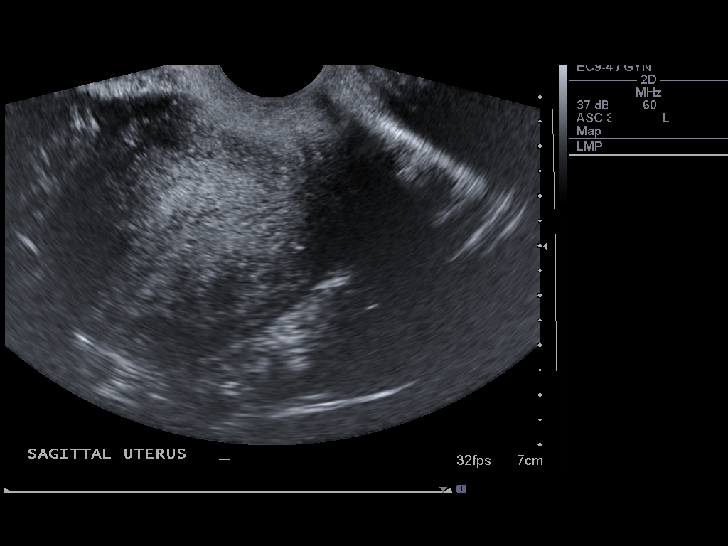

[14 of 25 positions shown; findings below may reference images not displayed]

FINDINGS: Uterus measures 8.1 x 4.8 x 6.0 cm.  Uterus is heterogeneous and
enlarged related to scattered small uterine fibroids.  Dominant
exophytic subserosal fibroid in the fundus measures 2.5 cm in
greatest diameter.  Additional small fundal fibroid measures 2.6 cm
in greatest diameter.

Endometrium 5 mm thickness.  Normal appearance.

Right Ovary measures 3.4 x 1.3 x 2.0 cm.  Small dominant right
follicle measures 11 mm.

Left Ovary measures 1.9 x 1.3 x 1.8 cm.  Small 10 mm dominant
follicle noted.

Other Findings:  Small amount of free fluid.
IMPRESSION: Fibroid uterus.

Normal endometrial thickness.

Normal ovaries.

Physiologic free fluid

## 2011-01-08 IMAGING — CR DG ABD PORTABLE 1V
1 series · 2 of 2 positions shown · non-contrast
Comparison: 03/17/2010

CLINICAL DATA: Acute renal failure, diarrhea

ABDOMEN - 1 VIEW

[Series 1: series (date) · U · 2 of 2 slices shown]
[im 1/2]
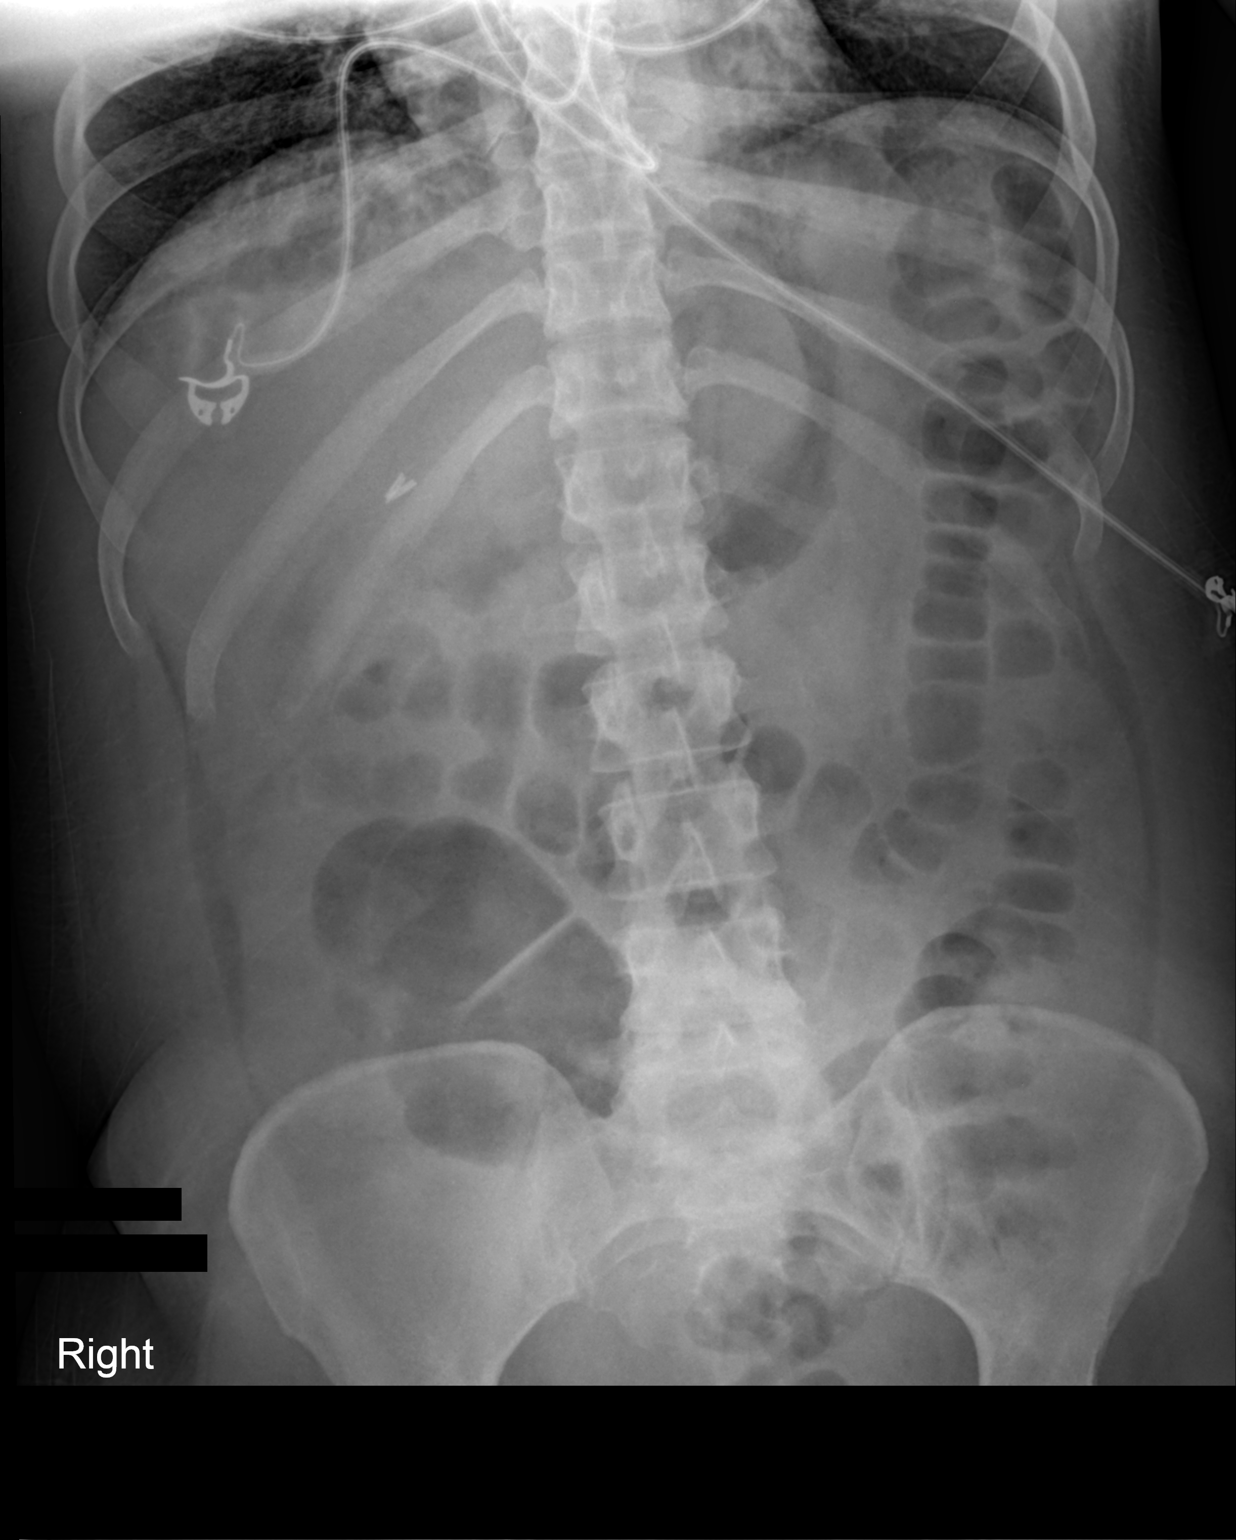
[im 2/2]
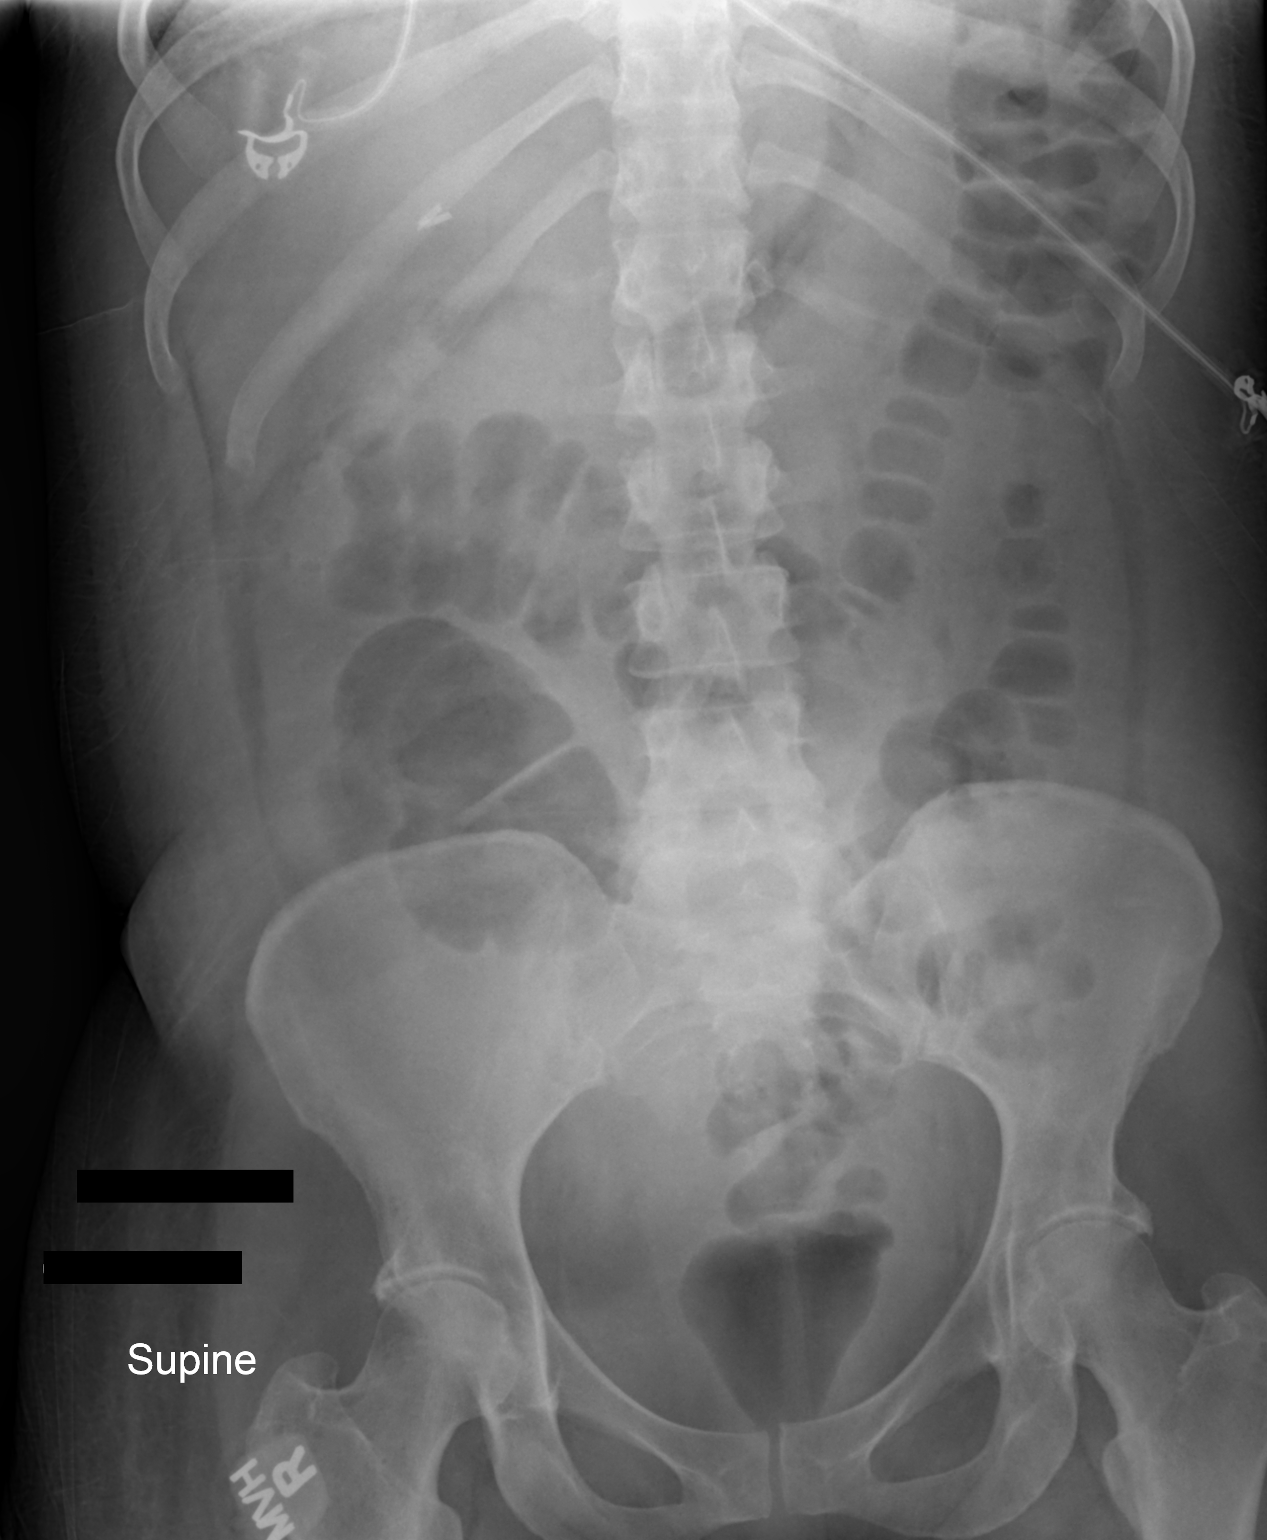

[2 of 2 positions shown; findings below may reference images not displayed]

FINDINGS: There is a general paucity of small bowel gas.  Gas is
seen throughout the colon.  Cholecystectomy clips are imaged in the
right upper quadrant. The lung bases are clear.  Degenerative
changes are seen in the hips, bilaterally.
IMPRESSION: Nonobstructive bowel gas pattern.

## 2011-01-13 IMAGING — CT CT ABD-PELV W/ CM
2 of 5 series · 17 of 46 positions shown, 19 images · IV contrast (agent unspecified)
Comparison: Acute abdominal series 03/29/2010 and CT abdomen pelvis
03/18/2010

CLINICAL DATA: Nausea, vomiting, diarrhea and abdominal pain.

CT ABDOMEN AND PELVIS WITH CONTRAST
TECHNIQUE: Multidetector CT imaging of the abdomen and pelvis was
performed following the standard protocol during bolus
administration of intravenous contrast.
Contrast: 100 ml [id number]-YWW

[Series 2: rtn a/p with · axial · 0.61mm/px · z∈[-410,-46]mm · 14 of 83 slices shown, 16 images]
[im 5/83  soft-tissue]
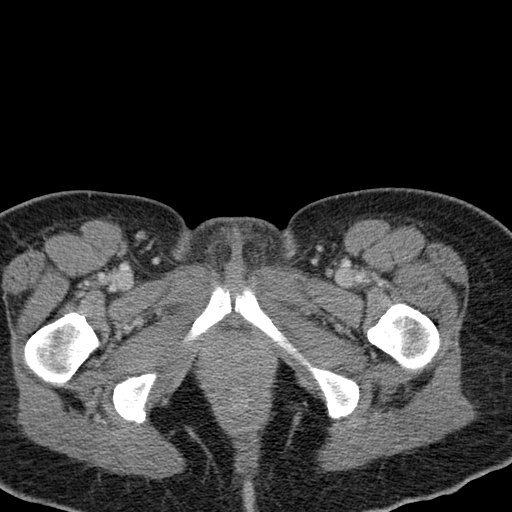
[im 5/83  bone]
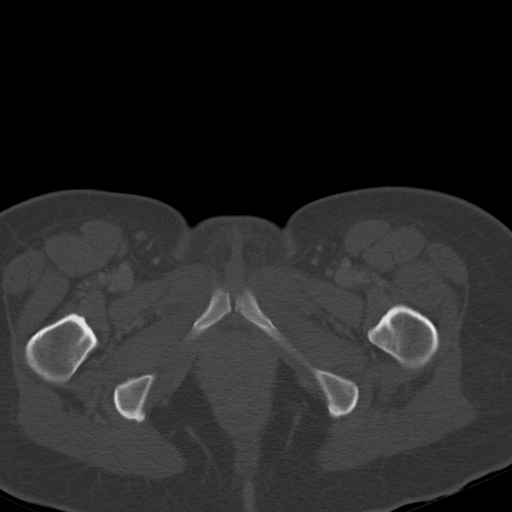
[im 10/83  soft-tissue]
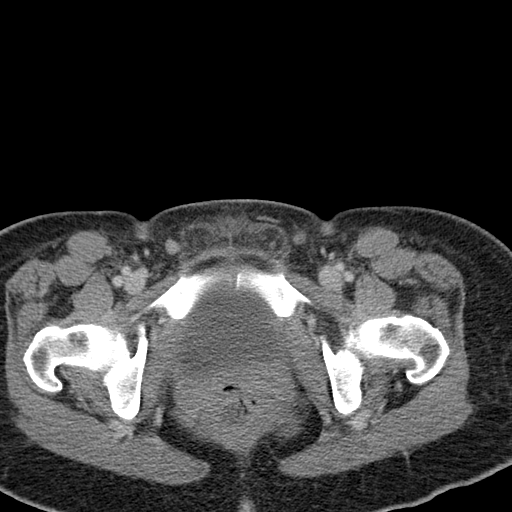
[im 19/83  soft-tissue]
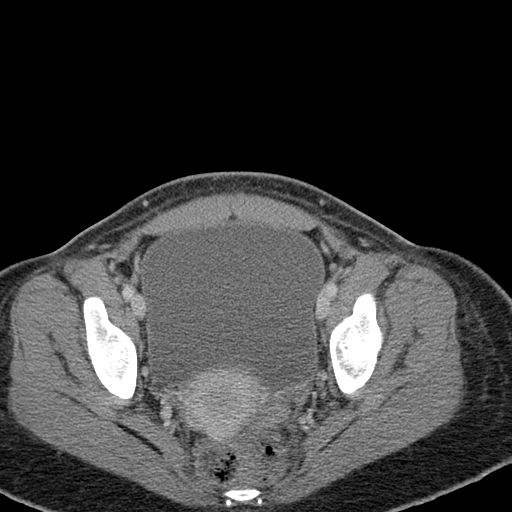
[im 23/83  soft-tissue]
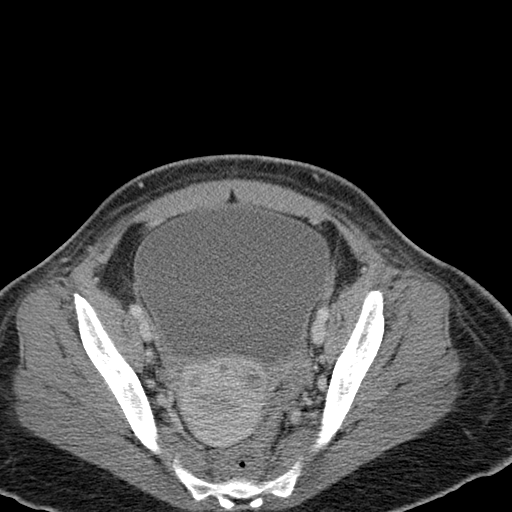
[im 28/83  soft-tissue]
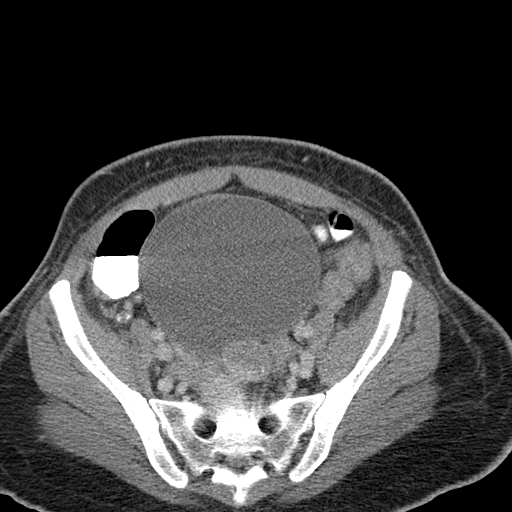
[im 32/83  soft-tissue]
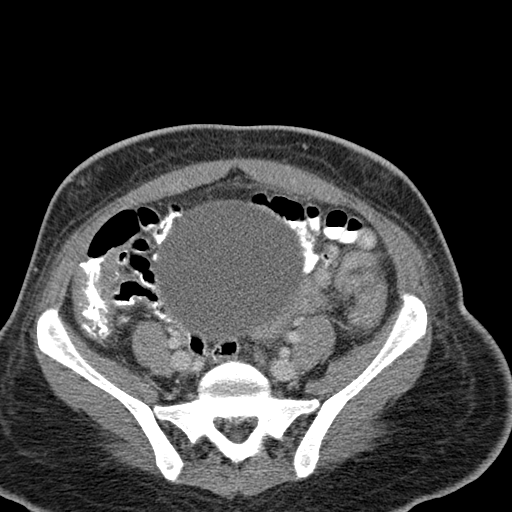
[im 37/83  soft-tissue]
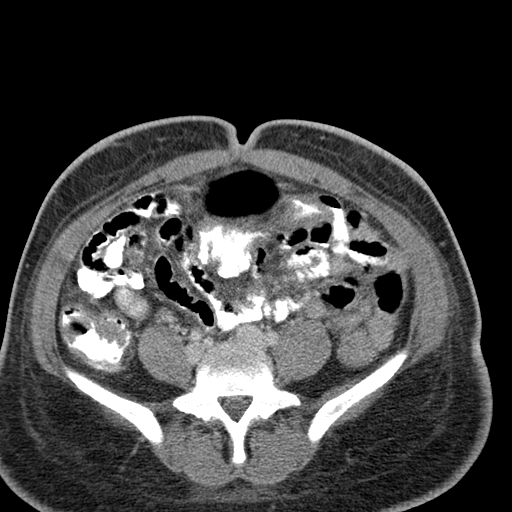
[im 46/83  soft-tissue]
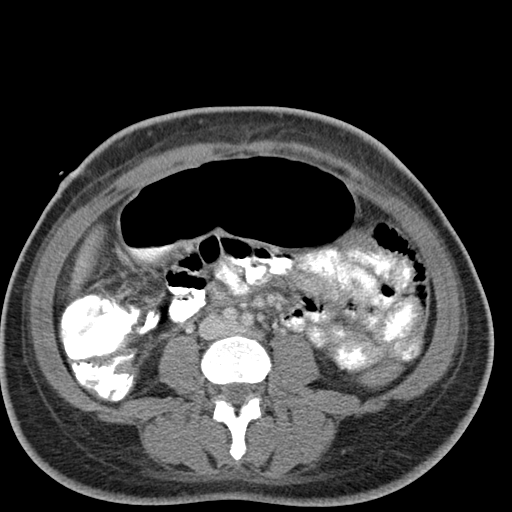
[im 51/83  soft-tissue]
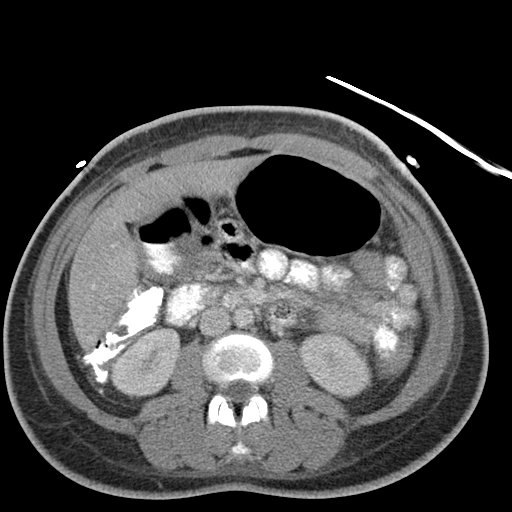
[im 51/83  bone]
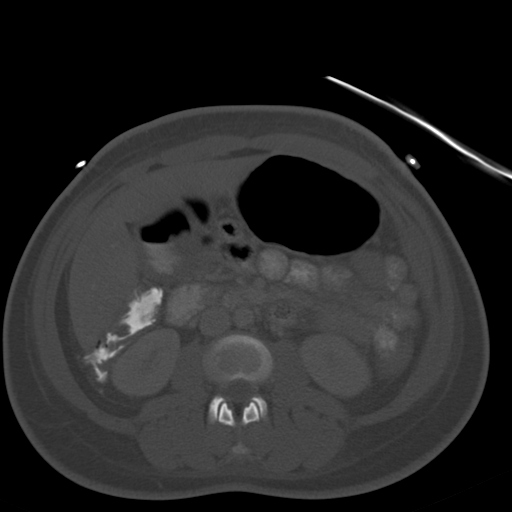
[im 55/83  soft-tissue]
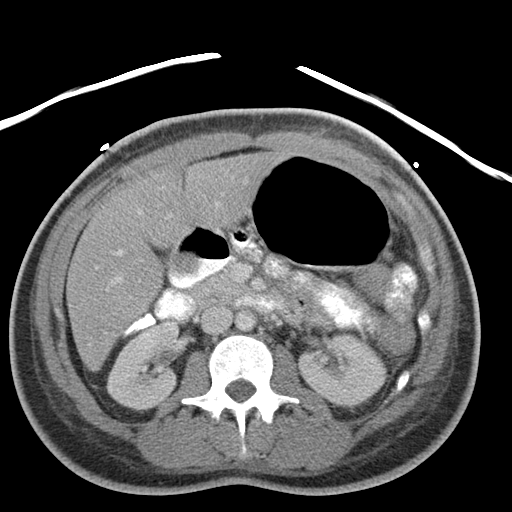
[im 60/83  soft-tissue]
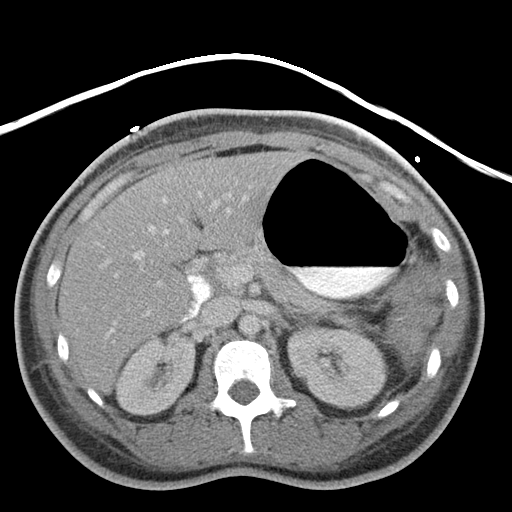
[im 64/83  soft-tissue]
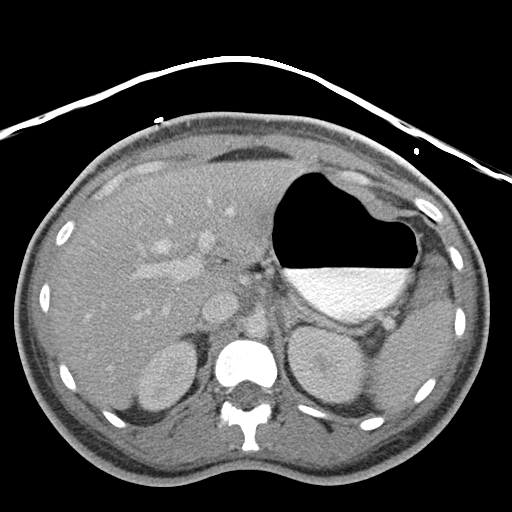
[im 73/83  soft-tissue]
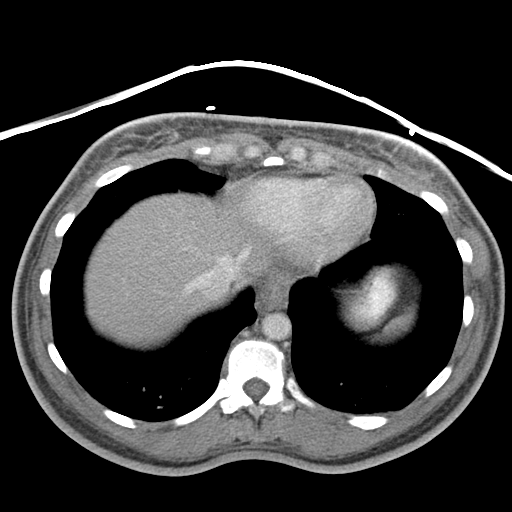
[im 78/83  soft-tissue]
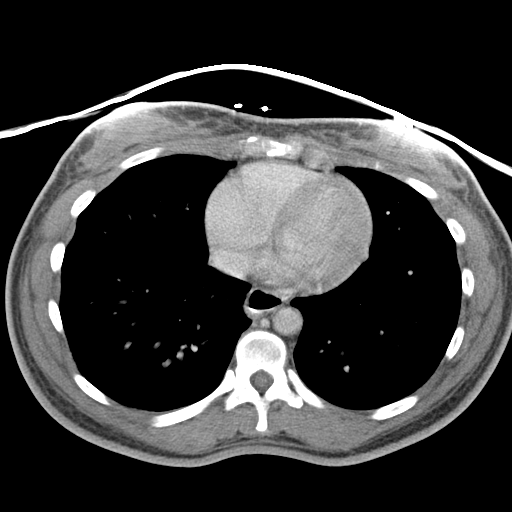

[Series 602: <mpr thick range> · coronal · 0.81mm/px · 3 of 84 slices shown]
[im 28/84  soft-tissue]
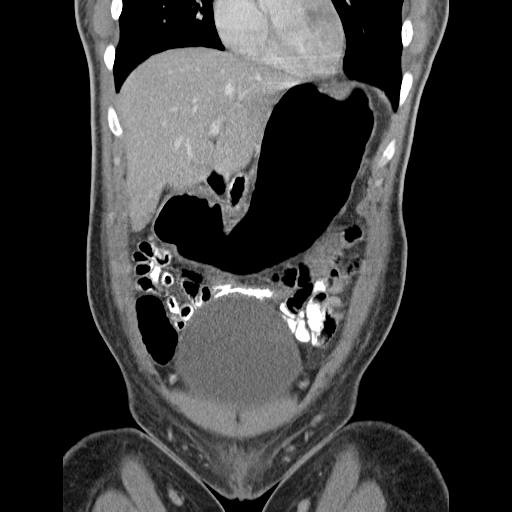
[im 37/84  soft-tissue]
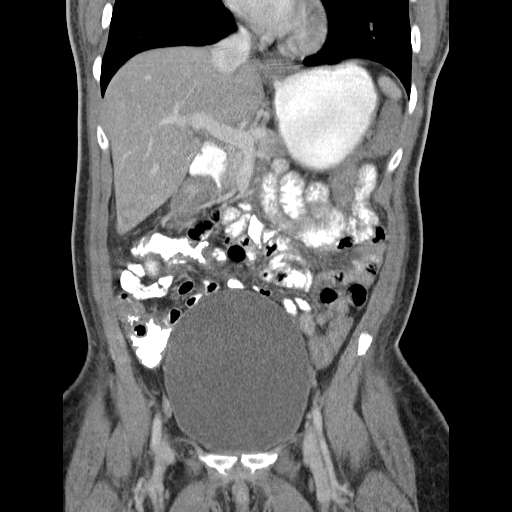
[im 47/84  soft-tissue]
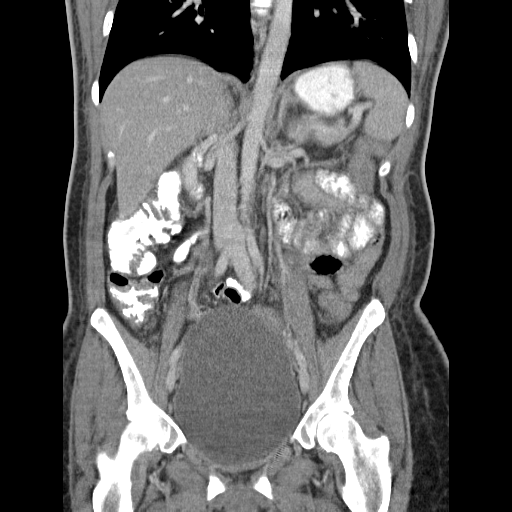

[17 of 46 positions shown; findings below may reference images not displayed]

FINDINGS: Lung bases show mild dependent atelectasis.  Heart size
normal.  No pericardial or pleural effusion.  The distal esophagus
is dilated.

Liver is unremarkable.  Cholecystectomy.  Adrenal glands, kidneys,
spleen, pancreas, stomach and small bowel are unremarkable.  There
is thickening of the cecum as well as the ascending and transverse
colon.  The descending and sigmoid colon are poorly distended,
limiting evaluation.

Bladder is distended.  Low attenuation lesions in the uterus
measure up to 3.0 cm off the uterine fundus, and most consistent
with fibroids.  No pathologically enlarged lymph nodes.  No free
fluid.
IMPRESSION: Cecal and ascending/transverse colonic wall thickening.  Descending
and sigmoid colon are poorly distended, limiting evaluation.
Findings are indicative of colitis, which may be infectious or
inflammatory in etiology.

## 2011-01-13 IMAGING — DX DG CHEST 1V PORT
1 series · 1 of 1 positions shown · non-contrast
Comparison: 10/12/2009

CLINICAL DATA: PICC line placement

PORTABLE CHEST - 1 VIEW

[series 1]
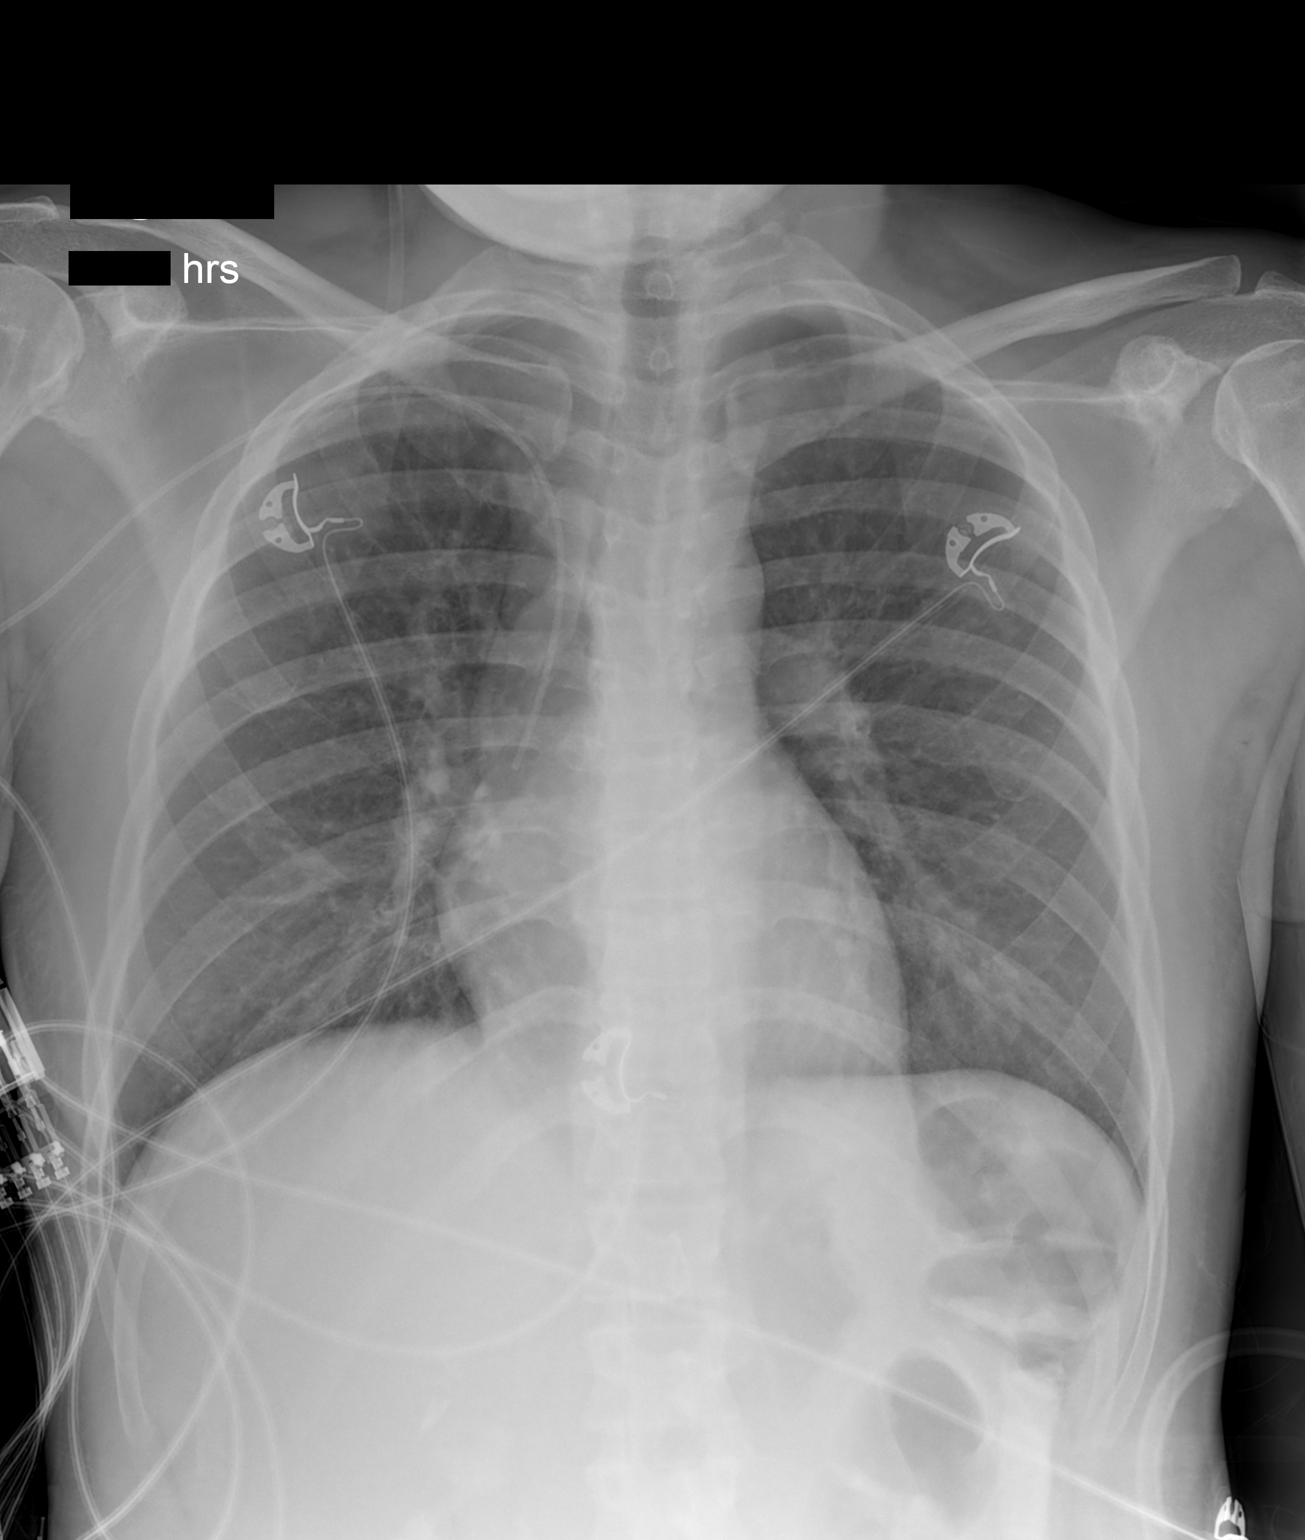

[1 of 1 positions shown; findings below may reference images not displayed]

FINDINGS: 8800 hours. The lungs are clear without focal infiltrate,
edema, pneumothorax or pleural effusion. The cardiopericardial
silhouette is within normal limits for size. Right PICC line tip
projects at the distal SVC level. Telemetry leads overlie the
chest.
IMPRESSION: Right PICC line tip is at the distal SVC level.

## 2011-01-13 IMAGING — CR DG ABDOMEN ACUTE W/ 1V CHEST
4 series · 4 of 4 positions shown · non-contrast
Comparison: 03/24/2010 and 10/20/2009

CLINICAL DATA: Diabetes and stomach pain.

ACUTE ABDOMEN SERIES (ABDOMEN 2 VIEW & CHEST 1 VIEW)

[w abdomen decub * (1 of 2)]
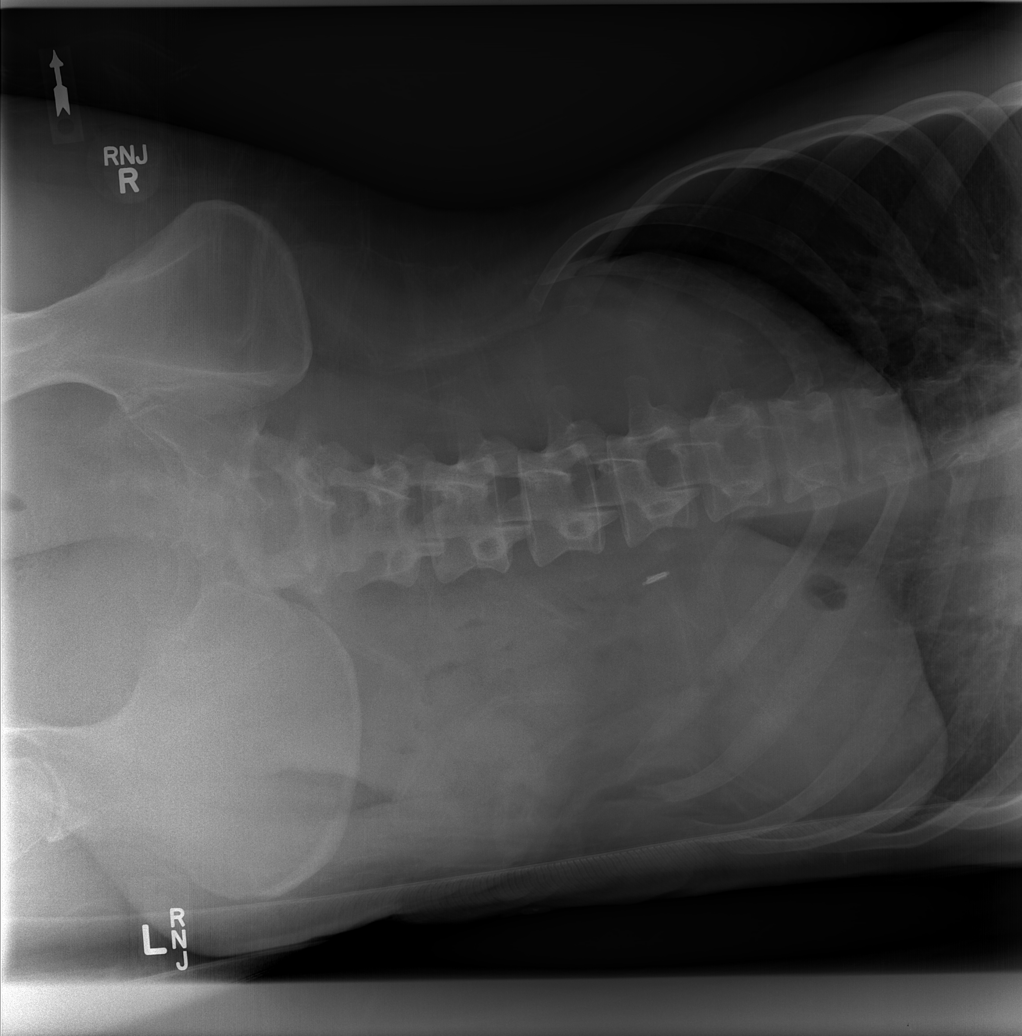

[w abdomen decub * (2 of 2)]
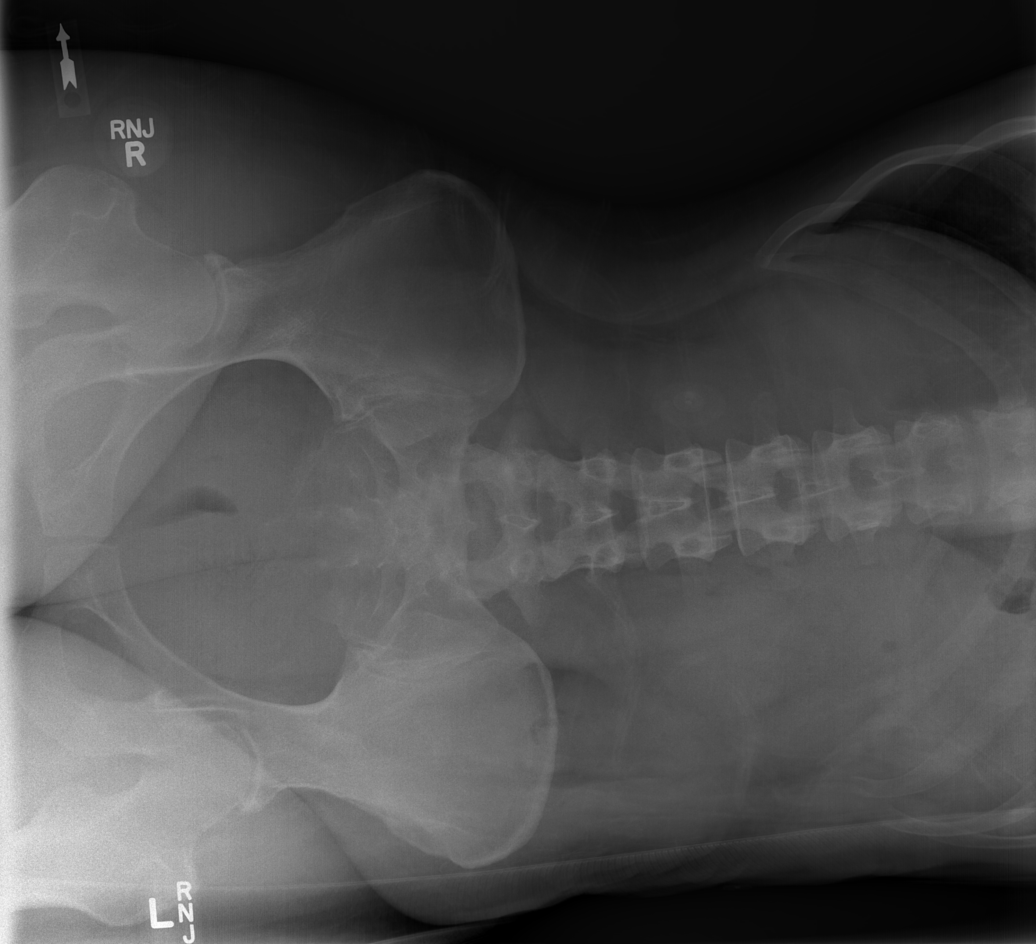

[view not recorded (1 of 2)]
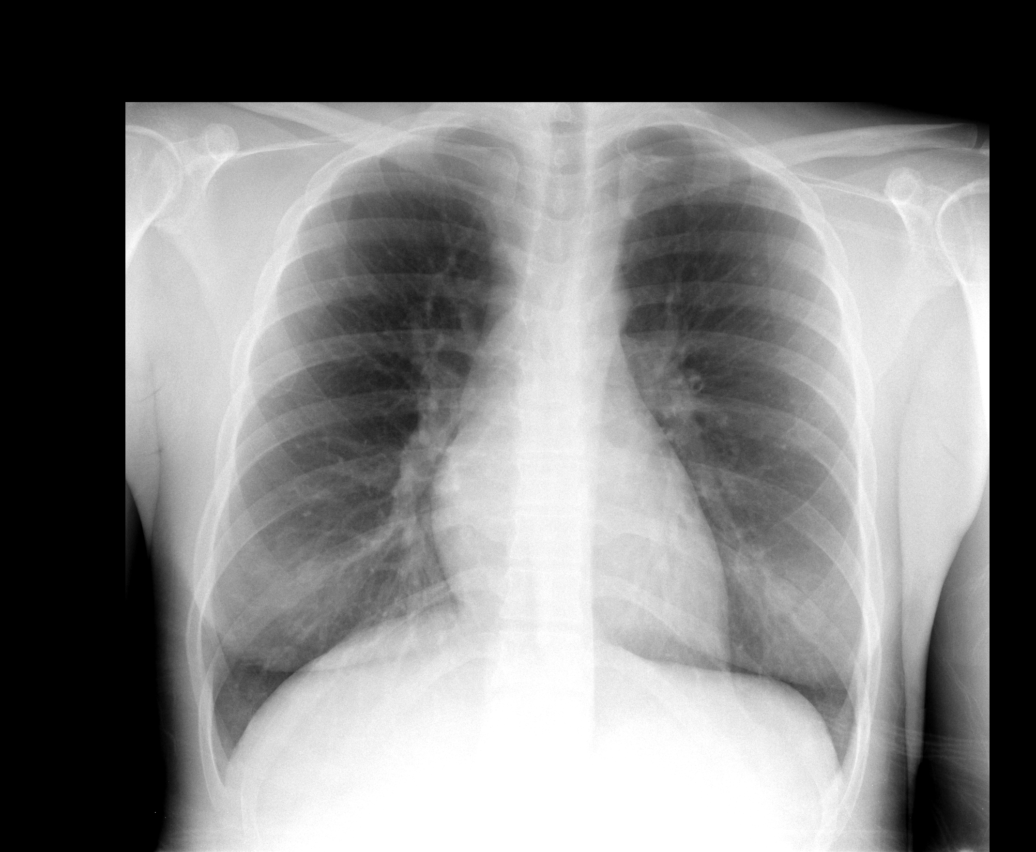

[view not recorded (2 of 2)]
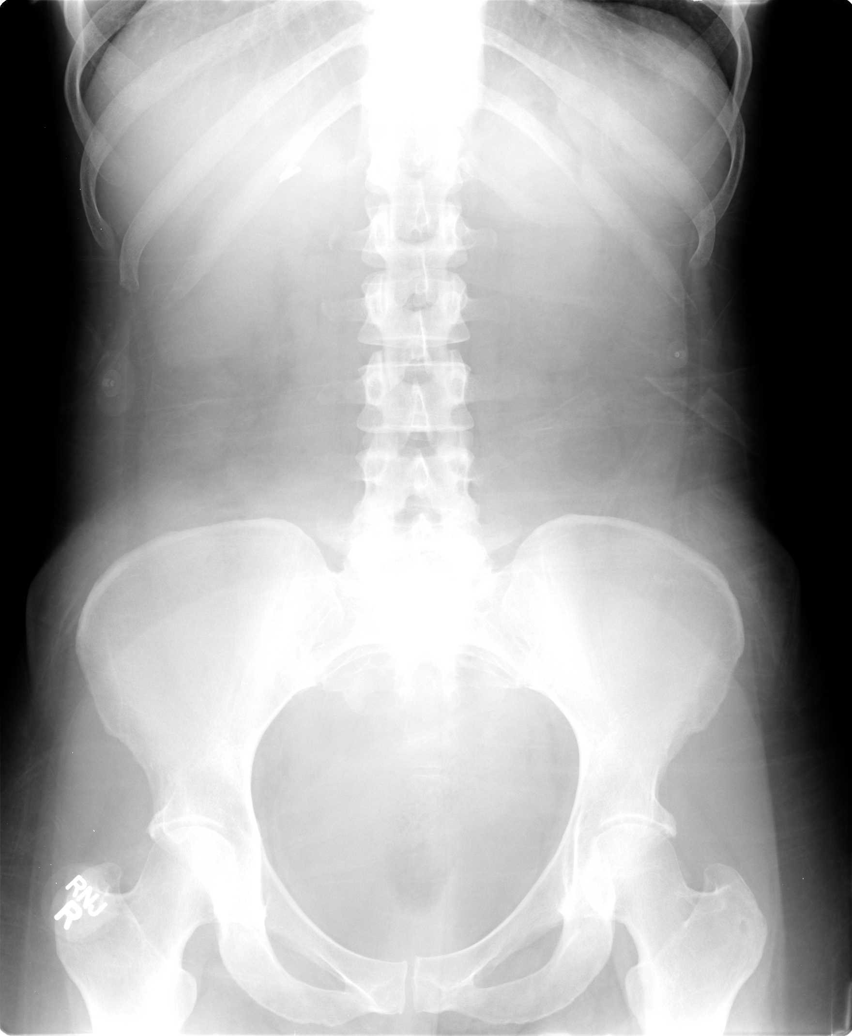

[4 of 4 positions shown; findings below may reference images not displayed]

FINDINGS: Chest radiograph demonstrates clear lungs.  Densities in
both lung bases are consistent with breast tissue.  In fact, there
may be a nipple shadow in the right lung base.  No evidence for
free air on the decubitus images.  There is paucity of bowel gas
throughout the abdomen.  Small amount of gas near the rectum.  The
gallbladder has been removed.
IMPRESSION: No acute chest or abdominal findings.

Paucity of bowel gas in the abdomen.

## 2011-02-27 ENCOUNTER — Emergency Department (HOSPITAL_COMMUNITY)
Admission: EM | Admit: 2011-02-27 | Discharge: 2011-02-27 | Disposition: A | Discharge status: Home / Self Care | Payer: 59 | Source: Home / Self Care | Attending: Emergency Medicine | Admitting: Emergency Medicine

## 2011-02-27 ENCOUNTER — Inpatient Hospital Stay (HOSPITAL_COMMUNITY)
Admission: EM | Admit: 2011-02-27 | Discharge: 2011-03-01 | DRG: 638 | Disposition: A | Discharge status: Home / Self Care | Payer: 59 | Source: Ambulatory Visit | Attending: Internal Medicine | Admitting: Internal Medicine

## 2011-02-27 DIAGNOSIS — Z794 Long term (current) use of insulin: Secondary | ICD-10-CM

## 2011-02-27 DIAGNOSIS — Z79899 Other long term (current) drug therapy: Secondary | ICD-10-CM

## 2011-02-27 DIAGNOSIS — Z9119 Patient's noncompliance with other medical treatment and regimen: Secondary | ICD-10-CM

## 2011-02-27 DIAGNOSIS — E1010 Type 1 diabetes mellitus with ketoacidosis without coma: Principal | ICD-10-CM | POA: Diagnosis present

## 2011-02-27 DIAGNOSIS — I10 Essential (primary) hypertension: Secondary | ICD-10-CM | POA: Insufficient documentation

## 2011-02-27 DIAGNOSIS — D509 Iron deficiency anemia, unspecified: Secondary | ICD-10-CM | POA: Diagnosis not present

## 2011-02-27 DIAGNOSIS — E876 Hypokalemia: Secondary | ICD-10-CM | POA: Diagnosis not present

## 2011-02-27 DIAGNOSIS — E119 Type 2 diabetes mellitus without complications: Secondary | ICD-10-CM | POA: Insufficient documentation

## 2011-02-27 DIAGNOSIS — F1010 Alcohol abuse, uncomplicated: Secondary | ICD-10-CM | POA: Diagnosis present

## 2011-02-27 DIAGNOSIS — G609 Hereditary and idiopathic neuropathy, unspecified: Secondary | ICD-10-CM | POA: Diagnosis present

## 2011-02-27 DIAGNOSIS — N390 Urinary tract infection, site not specified: Secondary | ICD-10-CM | POA: Diagnosis present

## 2011-02-27 DIAGNOSIS — R112 Nausea with vomiting, unspecified: Secondary | ICD-10-CM | POA: Insufficient documentation

## 2011-02-27 DIAGNOSIS — Z91199 Patient's noncompliance with other medical treatment and regimen due to unspecified reason: Secondary | ICD-10-CM

## 2011-02-27 LAB — URINALYSIS, ROUTINE W REFLEX MICROSCOPIC
Bilirubin Urine: NEGATIVE
Ketones, ur: >80 mg/dL — AB
Protein, ur: 30 mg/dL — AB
Urobilinogen, UA: 0.2 mg/dL (ref 0.0–1.0)

## 2011-02-27 LAB — POCT I-STAT, CHEM 8
BUN: 11 mg/dL (ref 6–23)
Calcium, Ion: 1.08 mmol/L — ABNORMAL LOW (ref 1.12–1.32)
Chloride: 109 mEq/L (ref 96–112)
HCT: 37 % (ref 36.0–46.0)
Potassium: 3.2 mEq/L — ABNORMAL LOW (ref 3.5–5.1)
Sodium: 140 mEq/L (ref 135–145)

## 2011-02-27 LAB — GLUCOSE, CAPILLARY
Glucose-Capillary: 244 mg/dL — ABNORMAL HIGH (ref 70–99)
Glucose-Capillary: 365 mg/dL — ABNORMAL HIGH (ref 70–99)
Glucose-Capillary: 377 mg/dL — ABNORMAL HIGH (ref 70–99)

## 2011-02-27 LAB — POCT I-STAT 3, VENOUS BLOOD GAS (G3P V)
Bicarbonate: 14.3 mEq/L — ABNORMAL LOW (ref 20.0–24.0)
pH, Ven: 7.252 (ref 7.250–7.300)
pO2, Ven: 141 mmHg — ABNORMAL HIGH (ref 30.0–45.0)

## 2011-02-27 LAB — COMPREHENSIVE METABOLIC PANEL
ALT: 13 U/L (ref 0–35)
Alkaline Phosphatase: 99 U/L (ref 39–117)
CO2: 15 mEq/L — ABNORMAL LOW (ref 19–32)
GFR calc Af Amer: >60 mL/min (ref >60)
GFR calc non Af Amer: >60 mL/min (ref >60)
Glucose, Bld: 389 mg/dL — ABNORMAL HIGH (ref 70–99)
Potassium: 3.8 mEq/L (ref 3.5–5.1)
Sodium: 136 mEq/L (ref 135–145)
Total Bilirubin: 1.1 mg/dL (ref 0.3–1.2)

## 2011-02-27 LAB — DIFFERENTIAL
Basophils Absolute: 0 10*3/uL (ref 0.0–0.1)
Eosinophils Absolute: 0.1 10*3/uL (ref 0.0–0.7)
Lymphocytes Relative: 8 % — ABNORMAL LOW (ref 12–46)
Lymphocytes Relative: 8 % — ABNORMAL LOW (ref 12–46)
Lymphs Abs: 0.9 10*3/uL (ref 0.7–4.0)
Lymphs Abs: 1.2 10*3/uL (ref 0.7–4.0)
Monocytes Absolute: 0.8 10*3/uL (ref 0.1–1.0)
Neutro Abs: 8.8 10*3/uL — ABNORMAL HIGH (ref 1.7–7.7)
Neutro Abs: 13.4 10*3/uL — ABNORMAL HIGH (ref 1.7–7.7)
Neutrophils Relative %: 85 % — ABNORMAL HIGH (ref 43–77)

## 2011-02-27 LAB — CBC
HCT: 37 % (ref 36.0–46.0)
Hemoglobin: 12 g/dL (ref 12.0–15.0)
Hemoglobin: 12.8 g/dL (ref 12.0–15.0)
MCV: 82.1 fL (ref 78.0–100.0)
Platelets: 294 10*3/uL (ref 150–400)
RBC: 4.3 MIL/uL (ref 3.87–5.11)
WBC: 10.3 10*3/uL (ref 4.0–10.5)
WBC: 15.4 10*3/uL — ABNORMAL HIGH (ref 4.0–10.5)

## 2011-02-27 LAB — URINE MICROSCOPIC-ADD ON

## 2011-02-28 LAB — GLUCOSE, CAPILLARY
Glucose-Capillary: 293 mg/dL — ABNORMAL HIGH (ref 70–99)
Glucose-Capillary: 430 mg/dL — ABNORMAL HIGH (ref 70–99)
Glucose-Capillary: 198 mg/dL — ABNORMAL HIGH (ref 70–99)
Glucose-Capillary: 237 mg/dL — ABNORMAL HIGH (ref 70–99)
Glucose-Capillary: 338 mg/dL — ABNORMAL HIGH (ref 70–99)
Glucose-Capillary: 78 mg/dL (ref 70–99)
Glucose-Capillary: 286 mg/dL — ABNORMAL HIGH (ref 70–99)
Glucose-Capillary: 43 mg/dL — CL (ref 70–99)
Glucose-Capillary: 121 mg/dL — ABNORMAL HIGH (ref 70–99)

## 2011-02-28 LAB — BASIC METABOLIC PANEL
BUN: 16 mg/dL (ref 6–23)
BUN: 13 mg/dL (ref 6–23)
CO2: 15 mEq/L — ABNORMAL LOW (ref 19–32)
CO2: 19 mEq/L (ref 19–32)
Calcium: 8.9 mg/dL (ref 8.4–10.5)
Calcium: 8.3 mg/dL — ABNORMAL LOW (ref 8.4–10.5)
Chloride: 108 mEq/L (ref 96–112)
Chloride: 111 mEq/L (ref 96–112)
Creatinine, Ser: 1 mg/dL (ref 0.50–1.10)
Creatinine, Ser: 0.87 mg/dL (ref 0.50–1.10)
GFR calc Af Amer: >60 mL/min (ref >60)
GFR calc Af Amer: >60 mL/min (ref >60)
GFR calc Af Amer: >60 mL/min (ref >60)
GFR calc non Af Amer: >60 mL/min (ref >60)
Glucose, Bld: 80 mg/dL (ref 70–99)
Potassium: 3.8 mEq/L (ref 3.5–5.1)
Sodium: 142 mEq/L (ref 135–145)

## 2011-02-28 LAB — URINALYSIS, MICROSCOPIC ONLY
Nitrite: POSITIVE — AB
Protein, ur: 100 mg/dL — AB
Urobilinogen, UA: 0.2 mg/dL (ref 0.0–1.0)

## 2011-03-01 LAB — CBC
MCH: 28 pg (ref 26.0–34.0)
MCV: 83.7 fL (ref 78.0–100.0)
Platelets: 274 10*3/uL (ref 150–400)
RDW: 14 % (ref 11.5–15.5)
WBC: 10.3 10*3/uL (ref 4.0–10.5)

## 2011-03-01 LAB — COMPREHENSIVE METABOLIC PANEL
ALT: 9 U/L (ref 0–35)
AST: 13 U/L (ref 0–37)
Albumin: 2.6 g/dL — ABNORMAL LOW (ref 3.5–5.2)
Calcium: 7.7 mg/dL — ABNORMAL LOW (ref 8.4–10.5)
Creatinine, Ser: 0.84 mg/dL (ref 0.50–1.10)
Sodium: 141 mEq/L (ref 135–145)

## 2011-03-01 LAB — GLUCOSE, CAPILLARY
Glucose-Capillary: 137 mg/dL — ABNORMAL HIGH (ref 70–99)
Glucose-Capillary: 70 mg/dL (ref 70–99)

## 2011-03-01 LAB — URINE CULTURE

## 2011-03-20 LAB — URINALYSIS, ROUTINE W REFLEX MICROSCOPIC
Ketones, ur: >80 — AB
Nitrite: NEGATIVE
Nitrite: NEGATIVE
Specific Gravity, Urine: 1.028
Specific Gravity, Urine: 1.016
Urobilinogen, UA: 1
pH: 5.5

## 2011-03-20 LAB — DIFFERENTIAL
Basophils Absolute: 0
Basophils Absolute: 0
Basophils Relative: 1
Eosinophils Absolute: 0.1
Eosinophils Relative: 2
Lymphs Abs: 1.1
Monocytes Absolute: 0.5
Neutro Abs: 4
Neutrophils Relative %: 64

## 2011-03-20 LAB — URINE MICROSCOPIC-ADD ON

## 2011-03-20 LAB — POCT I-STAT, CHEM 8
Calcium, Ion: 1.25
Creatinine, Ser: 1
Hemoglobin: 15
Sodium: 136
TCO2: 26

## 2011-03-20 LAB — COMPREHENSIVE METABOLIC PANEL
Alkaline Phosphatase: 87
BUN: 12
Chloride: 99
Glucose, Bld: 200 — ABNORMAL HIGH
Potassium: 3.8
Total Bilirubin: 1.2
Total Protein: 7.9

## 2011-03-20 LAB — CBC
HCT: 41.3
HCT: 40.1
Hemoglobin: 13.3
MCV: 94.6
Platelets: 516 — ABNORMAL HIGH
RDW: 18.9 — ABNORMAL HIGH
RDW: 18.7 — ABNORMAL HIGH

## 2011-03-20 LAB — POCT PREGNANCY, URINE: Operator id: 196461

## 2011-03-21 NOTE — H&P (Signed)
Yvette, [name] NO.:  [id number]  MEDICAL RECORD NO.:  [id number]  LOCATION:  [facility]                         FACILITY:  [facility]  PHYSICIAN:  Yvette Siren, MD           DATE OF BIRTH:  Aug 15, 1965  DATE OF ADMISSION:  02/27/2011 DATE OF DISCHARGE:                             HISTORY & PHYSICAL   PRIMARY CARE PHYSICIAN:  Yvette Blood, MD  ADVANCE DIRECTIVE:  Full code.  REASON FOR ADMISSION:  DKA.  HISTORY OF PRESENT ILLNESS:  This is a 45 year old female with history of type 1 diabetes, hypertension, peripheral neuropathy, prior retinal detachment, alcohol use, and history of noncompliance, presents to [facility] ER with persistent nausea, vomiting and abdominal pain. She was seen at [facility] yesterday for similar reason and subsequently discharged as her [name] glucose at that time was in the 200s.  Because of the nausea and vomiting, she stopped her Lantus afraid that she might be hypoglycemic, and today, she was found to be in DKA. Her workup in the emergency room included a pH of 7.25 (venous), bicarb of 15, and potassium of 3.8.  Her urinalysis showed only glycosuria, but no evidence of infection.  Her creatinine is normal at 0.97.  She has an elevated white count of 15.4 and hemoglobin of 12.8.  Her lipase is 12. Hospitalist was asked to admit the patient because of DKA.  PAST MEDICAL HISTORY:  Hypertension, diabetes, peripheral neuropathy, retinal detachment, C diff.  FAMILY HISTORY:  Significant for hypertension.  ALLERGIES:  Reportedly to ANESTHESIA.  CURRENT MEDICATIONS:  Lyrica, Lantus, Humalog, fluconazole.  REVIEW OF SYSTEMS:  She has no black stool, bloody stool, chest pain, or shortness of breath.  PHYSICAL EXAMINATION:  VITAL SIGNS:  [name] pressure 120/70, pulse of 110, temp 98.3. GENERAL:  She appeared tired but alert and oriented and conversing.  She is in no apparent distress.  Does not look excessively tachypneic. HEENT:  Sclerae  are nonicteric. NECK:  Supple.  Throat is clear.  No stridor. CARDIAC:  S1 and S2, tachycardia with soft 2/6 systolic ejection murmur at the left sternal border. LUNGS:  No wheezes, rales, or any evidence of consolidation. ABDOMEN:  Soft, nondistended, nontender.  No palpable mass.  No rebound. Bowel sounds present. EXTREMITIES:  No edema.  No calf tenderness. SKIN:  Warm and dry. NEUROLOGICAL:  Nonfocal. PSYCHIATRIC:  Unremarkable as well.  OBJECTIVE FINDINGS:  Pertinent labs include serum sodium 136, glucose of 389, BUN of 13, creatinine 0.97.  Liver function tests are normal. Urinalysis is negative.  Venous pH is 7.25.  White count of 15.4 thousand, hemoglobin of 12.8, MCV of 83, lipase of 12.  IMPRESSION:  This is a 45 year old female with diabetic ketoacidosis. We will admit her to the step-down unit for glucose stabilizer.  She will receive intravenous fluid and potassium supplement.  There is no evidence of infection except a leukocytosis which I suspect may be hemoconcentration and stress demargination.  She is stable, otherwise, and will be admitted to [facility] Team 1.  Her medication list has been reconciled, and I will continue them except for the insulin which will be followed by the  glucose stabilizer.  She is a full code.     Yvette Siren, MD     PL/MEDQ  D:  02/27/2011  T:  02/28/2011  Job:  [id number]  Electronically Signed by [name]  on 03/21/2011 07:50:28 PM

## 2011-03-23 NOTE — Discharge Summary (Signed)
  NAMEVICTORIOUS, [name]               ACCOUNT NO.:  [id number]  MEDICAL RECORD NO.:  [id number]  LOCATION:  3310                         FACILITY:  [facility]  PHYSICIAN:  Richarda Overlie, MD       DATE OF BIRTH:  1966-03-03  DATE OF ADMISSION:  02/27/2011 DATE OF DISCHARGE:  03/01/2011                              DISCHARGE SUMMARY   PRIMARY CARE PHYSICIAN:  Dr. Lonia Blood.  DISCHARGE DIAGNOSES: 1. Diabetic ketoacidosis. 2. Insulin dependent type 2 with noncompliance with insulin. 3. Hypokalemia. 4. Iron-deficiency anemia. 5. Urinary tract infection.  DISCHARGE MEDICATIONS: 1. Levofloxacin 750 mg p.o. daily x5 days. 2. Phenergan 12.5 mg p.o. q. 6 p.r.n. 3. Lantus 30 units subcu at bedtime. 4. Lyrica 75 mg 1 capsule p.o. twice daily. 5. NovoLog sliding scale insulin. 6. Promethazine 25 mg 1 tablet p.o. q.4 h as needed.  SUBJECTIVE:  This is a 45 year old female with a history of type 2 diabetes insulin dependent, hypertension, peripheral neuropathy, history of alcohol use and noncompliance with medication who presents to the ER with nausea, vomiting, abdominal pain and was found to have diarrhea, and she was found to have diabetic ketoacidosis with a venous pH of 7.25 and a bicarb of 15.  The patient was also found to have any urinary tract infection and an elevated white count of 15.4.  She was admitted to the hospital and was treated per DKA protocol with a glucose stabilizer drip and IV insulin therapy and was placed in step-down.  She was also started on levofloxacin for her urinary tract infection.  An urine culture thus far has shown no obvious growth, but we will continue with levofloxacin for about another 5 days.  She is able to tolerate p.o. levofloxacin.  Diabetes, the patient has been counseled about medication compliance and has been restarted on her Lantus.  The patient was doing well, otherwise tolerating p.o. meal and will be discharged home today after  lunch.  DISCHARGE INSTRUCTIONS:  1800 ADA diet.  Follow up with PCP in 5-7 days.     Richarda Overlie, MD     NA/MEDQ  D:  03/01/2011  T:  03/01/2011  Job:  [id number]  cc:   Lonia Blood, M.D.  Electronically Signed by Richarda Overlie MD on 03/23/2011 07:10:02 AM

## 2011-03-30 LAB — URINALYSIS, ROUTINE W REFLEX MICROSCOPIC
Bilirubin Urine: NEGATIVE
Ketones, ur: >80 mg/dL — AB
Protein, ur: 30 mg/dL — AB
Urobilinogen, UA: 0.2 mg/dL (ref 0.0–1.0)

## 2011-03-30 LAB — BASIC METABOLIC PANEL WITH GFR
BUN: 3 mg/dL — ABNORMAL LOW (ref 6–23)
BUN: 3 mg/dL — ABNORMAL LOW (ref 6–23)
BUN: 6 mg/dL (ref 6–23)
BUN: 8 mg/dL (ref 6–23)
CO2: 12 meq/L — ABNORMAL LOW (ref 19–32)
CO2: 15 meq/L — ABNORMAL LOW (ref 19–32)
CO2: 17 meq/L — ABNORMAL LOW (ref 19–32)
CO2: 19 meq/L (ref 19–32)
Calcium: 7.1 mg/dL — ABNORMAL LOW (ref 8.4–10.5)
Calcium: 7.3 mg/dL — ABNORMAL LOW (ref 8.4–10.5)
Calcium: 8 mg/dL — ABNORMAL LOW (ref 8.4–10.5)
Calcium: 8.2 mg/dL — ABNORMAL LOW (ref 8.4–10.5)
Chloride: 110 meq/L (ref 96–112)
Chloride: 116 meq/L — ABNORMAL HIGH (ref 96–112)
Chloride: 116 meq/L — ABNORMAL HIGH (ref 96–112)
Chloride: 120 meq/L — ABNORMAL HIGH (ref 96–112)
Creatinine, Ser: 0.83 mg/dL (ref 0.4–1.2)
Creatinine, Ser: 0.88 mg/dL (ref 0.4–1.2)
Creatinine, Ser: 0.88 mg/dL (ref 0.4–1.2)
Creatinine, Ser: 0.98 mg/dL (ref 0.4–1.2)
GFR calc non Af Amer: >60 mL/min
GFR calc non Af Amer: >60 mL/min
GFR calc non Af Amer: >60 mL/min
GFR calc non Af Amer: >60 mL/min
Glucose, Bld: 118 mg/dL — ABNORMAL HIGH (ref 70–99)
Glucose, Bld: 121 mg/dL — ABNORMAL HIGH (ref 70–99)
Glucose, Bld: 133 mg/dL — ABNORMAL HIGH (ref 70–99)
Glucose, Bld: 79 mg/dL (ref 70–99)
Potassium: 3.3 meq/L — ABNORMAL LOW (ref 3.5–5.1)
Potassium: 3.4 meq/L — ABNORMAL LOW (ref 3.5–5.1)
Potassium: 3.4 meq/L — ABNORMAL LOW (ref 3.5–5.1)
Potassium: 3.6 meq/L (ref 3.5–5.1)
Sodium: 134 meq/L — ABNORMAL LOW (ref 135–145)
Sodium: 140 meq/L (ref 135–145)
Sodium: 142 meq/L (ref 135–145)
Sodium: 142 meq/L (ref 135–145)

## 2011-03-30 LAB — PHOSPHORUS
Phosphorus: 2 mg/dL — ABNORMAL LOW (ref 2.3–4.6)
Phosphorus: 3.3 mg/dL (ref 2.3–4.6)
Phosphorus: 3.5 mg/dL (ref 2.3–4.6)
Phosphorus: <1 mg/dL — CL (ref 2.3–4.6)
Phosphorus: <1 mg/dL — CL (ref 2.3–4.6)

## 2011-03-30 LAB — GLUCOSE, CAPILLARY
Glucose-Capillary: 104 mg/dL — ABNORMAL HIGH (ref 70–99)
Glucose-Capillary: 105 mg/dL — ABNORMAL HIGH (ref 70–99)
Glucose-Capillary: 122 mg/dL — ABNORMAL HIGH (ref 70–99)
Glucose-Capillary: 126 mg/dL — ABNORMAL HIGH (ref 70–99)
Glucose-Capillary: 128 mg/dL — ABNORMAL HIGH (ref 70–99)
Glucose-Capillary: 136 mg/dL — ABNORMAL HIGH (ref 70–99)
Glucose-Capillary: 138 mg/dL — ABNORMAL HIGH (ref 70–99)
Glucose-Capillary: 146 mg/dL — ABNORMAL HIGH (ref 70–99)
Glucose-Capillary: 148 mg/dL — ABNORMAL HIGH (ref 70–99)
Glucose-Capillary: 153 mg/dL — ABNORMAL HIGH (ref 70–99)
Glucose-Capillary: 155 mg/dL — ABNORMAL HIGH (ref 70–99)
Glucose-Capillary: 163 mg/dL — ABNORMAL HIGH (ref 70–99)
Glucose-Capillary: 175 mg/dL — ABNORMAL HIGH (ref 70–99)
Glucose-Capillary: 20 mg/dL — CL (ref 70–99)
Glucose-Capillary: 211 mg/dL — ABNORMAL HIGH (ref 70–99)
Glucose-Capillary: 223 mg/dL — ABNORMAL HIGH (ref 70–99)
Glucose-Capillary: 27 mg/dL — CL (ref 70–99)
Glucose-Capillary: 41 mg/dL — ABNORMAL LOW (ref 70–99)
Glucose-Capillary: 44 mg/dL — ABNORMAL LOW (ref 70–99)
Glucose-Capillary: 51 mg/dL — ABNORMAL LOW (ref 70–99)
Glucose-Capillary: 527 mg/dL (ref 70–99)
Glucose-Capillary: 53 mg/dL — ABNORMAL LOW (ref 70–99)
Glucose-Capillary: 59 mg/dL — ABNORMAL LOW (ref 70–99)
Glucose-Capillary: 64 mg/dL — ABNORMAL LOW (ref 70–99)
Glucose-Capillary: 67 mg/dL — ABNORMAL LOW (ref 70–99)
Glucose-Capillary: 68 mg/dL — ABNORMAL LOW (ref 70–99)
Glucose-Capillary: 68 mg/dL — ABNORMAL LOW (ref 70–99)
Glucose-Capillary: 71 mg/dL (ref 70–99)
Glucose-Capillary: 73 mg/dL (ref 70–99)
Glucose-Capillary: 74 mg/dL (ref 70–99)
Glucose-Capillary: 79 mg/dL (ref 70–99)
Glucose-Capillary: 80 mg/dL (ref 70–99)
Glucose-Capillary: 81 mg/dL (ref 70–99)
Glucose-Capillary: 88 mg/dL (ref 70–99)
Glucose-Capillary: 92 mg/dL (ref 70–99)
Glucose-Capillary: 97 mg/dL (ref 70–99)

## 2011-03-30 LAB — CBC
HCT: 24.1 % — ABNORMAL LOW (ref 36.0–46.0)
HCT: 25.3 % — ABNORMAL LOW (ref 36.0–46.0)
HCT: 26 % — ABNORMAL LOW (ref 36.0–46.0)
HCT: 28.4 % — ABNORMAL LOW (ref 36.0–46.0)
HCT: 36.1 % (ref 36.0–46.0)
Hemoglobin: 11.9 g/dL — ABNORMAL LOW (ref 12.0–15.0)
Hemoglobin: 8.3 g/dL — ABNORMAL LOW (ref 12.0–15.0)
Hemoglobin: 9.5 g/dL — ABNORMAL LOW (ref 12.0–15.0)
MCHC: 32.7 g/dL (ref 30.0–36.0)
MCHC: 33 g/dL (ref 30.0–36.0)
MCHC: 33.4 g/dL (ref 30.0–36.0)
MCV: 109 fL — ABNORMAL HIGH (ref 78.0–100.0)
MCV: 109 fL — ABNORMAL HIGH (ref 78.0–100.0)
MCV: 109.1 fL — ABNORMAL HIGH (ref 78.0–100.0)
MCV: 110.3 fL — ABNORMAL HIGH (ref 78.0–100.0)
Platelets: 234 10*3/uL (ref 150–400)
Platelets: 279 K/uL (ref 150–400)
Platelets: 313 10*3/uL (ref 150–400)
Platelets: 441 K/uL — ABNORMAL HIGH (ref 150–400)
Platelets: 557 K/uL — ABNORMAL HIGH (ref 150–400)
RBC: 2.32 MIL/uL — ABNORMAL LOW (ref 3.87–5.11)
RBC: 2.38 MIL/uL — ABNORMAL LOW (ref 3.87–5.11)
RBC: 2.61 MIL/uL — ABNORMAL LOW (ref 3.87–5.11)
RBC: 3.27 MIL/uL — ABNORMAL LOW (ref 3.87–5.11)
RDW: 15.3 % (ref 11.5–15.5)
RDW: 15.5 % (ref 11.5–15.5)
RDW: 15.6 % — ABNORMAL HIGH (ref 11.5–15.5)
WBC: 14.2 K/uL — ABNORMAL HIGH (ref 4.0–10.5)
WBC: 2.9 10*3/uL — ABNORMAL LOW (ref 4.0–10.5)
WBC: 21.9 K/uL — ABNORMAL HIGH (ref 4.0–10.5)
WBC: 3.6 K/uL — ABNORMAL LOW (ref 4.0–10.5)
WBC: 7 10*3/uL (ref 4.0–10.5)

## 2011-03-30 LAB — COMPREHENSIVE METABOLIC PANEL
ALT: 14 U/L (ref 0–35)
ALT: 17 U/L (ref 0–35)
AST: 17 U/L (ref 0–37)
AST: 23 U/L (ref 0–37)
Albumin: 4.1 g/dL (ref 3.5–5.2)
Alkaline Phosphatase: 91 U/L (ref 39–117)
CO2: 7 mEq/L — CL (ref 19–32)
Chloride: 103 mEq/L (ref 96–112)
Chloride: 117 mEq/L — ABNORMAL HIGH (ref 96–112)
Creatinine, Ser: 1.61 mg/dL — ABNORMAL HIGH (ref 0.4–1.2)
GFR calc Af Amer: 43 mL/min — ABNORMAL LOW (ref >60)
GFR calc Af Amer: 49 mL/min — ABNORMAL LOW (ref >60)
GFR calc non Af Amer: 41 mL/min — ABNORMAL LOW (ref >60)
Potassium: 4.2 mEq/L (ref 3.5–5.1)
Potassium: 4.4 mEq/L (ref 3.5–5.1)
Sodium: 134 mEq/L — ABNORMAL LOW (ref 135–145)
Sodium: 137 mEq/L (ref 135–145)
Total Bilirubin: 2 mg/dL — ABNORMAL HIGH (ref 0.3–1.2)
Total Bilirubin: 2.3 mg/dL — ABNORMAL HIGH (ref 0.3–1.2)

## 2011-03-30 LAB — BLOOD GAS, VENOUS
Acid-base deficit: 22 mmol/L — ABNORMAL HIGH (ref 0.0–2.0)
Drawn by: 284171
TCO2: 8.5 mmol/L (ref 0–100)
pCO2, Ven: 34.1 mmHg — ABNORMAL LOW (ref 45.0–50.0)

## 2011-03-30 LAB — MAGNESIUM
Magnesium: 1.7 mg/dL (ref 1.5–2.5)
Magnesium: 1.8 mg/dL (ref 1.5–2.5)
Magnesium: 1.8 mg/dL (ref 1.5–2.5)
Magnesium: 1.9 mg/dL (ref 1.5–2.5)
Magnesium: 1.9 mg/dL (ref 1.5–2.5)

## 2011-03-30 LAB — CARDIAC PANEL(CRET KIN+CKTOT+MB+TROPI)
CK, MB: 1.5 ng/mL (ref 0.3–4.0)
Relative Index: 1.2 (ref 0.0–2.5)
Total CK: 129 U/L (ref 7–177)

## 2011-03-30 LAB — BLOOD GAS, ARTERIAL
Acid-base deficit: 13.3 mmol/L — ABNORMAL HIGH (ref 0.0–2.0)
Bicarbonate: 11.8 meq/L — ABNORMAL LOW (ref 20.0–24.0)
Drawn by: 229971
O2 Content: 2 L/min
O2 Saturation: 98.5 %
Patient temperature: 98.6
TCO2: 11.3 mmol/L (ref 0–100)
pCO2 arterial: 25.4 mmHg — ABNORMAL LOW (ref 35.0–45.0)
pH, Arterial: 7.289 — ABNORMAL LOW (ref 7.350–7.400)
pO2, Arterial: 112 mmHg — ABNORMAL HIGH (ref 80.0–100.0)

## 2011-03-30 LAB — DIFFERENTIAL
Basophils Absolute: 0 10*3/uL (ref 0.0–0.1)
Basophils Absolute: 0 K/uL (ref 0.0–0.1)
Basophils Relative: 0 % (ref 0–1)
Basophils Relative: 0 % (ref 0–1)
Eosinophils Absolute: 0 10*3/uL (ref 0.0–0.7)
Eosinophils Absolute: 0 K/uL (ref 0.0–0.7)
Eosinophils Relative: 0 % (ref 0–5)
Lymphocytes Relative: 6 % — ABNORMAL LOW (ref 12–46)
Lymphs Abs: 1.3 K/uL (ref 0.7–4.0)
Monocytes Absolute: 0.9 K/uL (ref 0.1–1.0)
Monocytes Relative: 4 % (ref 3–12)
Neutro Abs: 12.8 10*3/uL — ABNORMAL HIGH (ref 1.7–7.7)
Neutro Abs: 19.7 K/uL — ABNORMAL HIGH (ref 1.7–7.7)
Neutrophils Relative %: 90 % — ABNORMAL HIGH (ref 43–77)
Neutrophils Relative %: 90 % — ABNORMAL HIGH (ref 43–77)

## 2011-03-30 LAB — IRON AND TIBC
Iron: 21 ug/dL — ABNORMAL LOW (ref 42–135)
Saturation Ratios: 6 % — ABNORMAL LOW (ref 20–55)
TIBC: 364 ug/dL (ref 250–470)
UIBC: 343 ug/dL

## 2011-03-30 LAB — URINE CULTURE
Colony Count: NO GROWTH
Culture: NO GROWTH

## 2011-03-30 LAB — BASIC METABOLIC PANEL
BUN: 1 mg/dL — ABNORMAL LOW (ref 6–23)
BUN: 3 mg/dL — ABNORMAL LOW (ref 6–23)
BUN: 5 mg/dL — ABNORMAL LOW (ref 6–23)
CO2: 17 mEq/L — ABNORMAL LOW (ref 19–32)
CO2: 17 mEq/L — ABNORMAL LOW (ref 19–32)
Chloride: 111 mEq/L (ref 96–112)
Chloride: 121 mEq/L — ABNORMAL HIGH (ref 96–112)
Creatinine, Ser: 1.28 mg/dL — ABNORMAL HIGH (ref 0.4–1.2)
GFR calc Af Amer: 56 mL/min — ABNORMAL LOW (ref >60)
GFR calc Af Amer: >60 mL/min (ref >60)
GFR calc Af Amer: >60 mL/min (ref >60)
GFR calc non Af Amer: 46 mL/min — ABNORMAL LOW (ref >60)
GFR calc non Af Amer: 58 mL/min — ABNORMAL LOW (ref >60)
GFR calc non Af Amer: >60 mL/min (ref >60)
GFR calc non Af Amer: >60 mL/min (ref >60)
GFR calc non Af Amer: >60 mL/min (ref >60)
Glucose, Bld: 193 mg/dL — ABNORMAL HIGH (ref 70–99)
Glucose, Bld: 76 mg/dL (ref 70–99)
Potassium: 2.6 mEq/L — CL (ref 3.5–5.1)
Potassium: 3.1 mEq/L — ABNORMAL LOW (ref 3.5–5.1)
Potassium: 3.7 mEq/L (ref 3.5–5.1)
Potassium: 3.9 mEq/L (ref 3.5–5.1)
Potassium: 4 mEq/L (ref 3.5–5.1)
Sodium: 144 mEq/L (ref 135–145)

## 2011-03-30 LAB — COMPREHENSIVE METABOLIC PANEL WITH GFR
ALT: 30 U/L (ref 0–35)
AST: 41 U/L — ABNORMAL HIGH (ref 0–37)
Albumin: 2.4 g/dL — ABNORMAL LOW (ref 3.5–5.2)
Alkaline Phosphatase: 53 U/L (ref 39–117)
BUN: 1 mg/dL — ABNORMAL LOW (ref 6–23)
CO2: 25 meq/L (ref 19–32)
Calcium: 7 mg/dL — ABNORMAL LOW (ref 8.4–10.5)
Chloride: 111 meq/L (ref 96–112)
Creatinine, Ser: 0.77 mg/dL (ref 0.4–1.2)
GFR calc non Af Amer: >60 mL/min
Glucose, Bld: 73 mg/dL (ref 70–99)
Potassium: 3.3 meq/L — ABNORMAL LOW (ref 3.5–5.1)
Sodium: 141 meq/L (ref 135–145)
Total Bilirubin: 0.9 mg/dL (ref 0.3–1.2)
Total Protein: 5.3 g/dL — ABNORMAL LOW (ref 6.0–8.3)

## 2011-03-30 LAB — CULTURE, BLOOD (ROUTINE X 2)
Culture: NO GROWTH
Culture: NO GROWTH

## 2011-03-30 LAB — KETONES, QUALITATIVE

## 2011-03-30 LAB — HEMOGLOBIN A1C
Hgb A1c MFr Bld: 13 % — ABNORMAL HIGH (ref 4.6–6.1)
Mean Plasma Glucose: 326 mg/dL

## 2011-03-30 LAB — RETICULOCYTES
RBC.: 3.03 MIL/uL — ABNORMAL LOW (ref 3.87–5.11)
Retic Count, Absolute: 54.5 10*3/uL (ref 19.0–186.0)

## 2011-03-30 LAB — URINE MICROSCOPIC-ADD ON

## 2011-03-30 LAB — TROPONIN I

## 2011-03-30 LAB — VITAMIN B12: Vitamin B-12: 315 pg/mL (ref 211–911)

## 2011-03-30 LAB — FOLATE: Folate: 10.8 ng/mL

## 2011-03-30 LAB — FERRITIN: Ferritin: 13 ng/mL (ref 10–291)

## 2011-03-30 LAB — LIPASE, BLOOD: Lipase: 14 U/L (ref 11–59)

## 2011-04-03 LAB — BASIC METABOLIC PANEL
CO2: 22
Calcium: 9.2
Creatinine, Ser: 0.94
GFR calc Af Amer: >60
Glucose, Bld: 280 — ABNORMAL HIGH

## 2011-04-03 LAB — URINALYSIS, ROUTINE W REFLEX MICROSCOPIC
Nitrite: NEGATIVE
Specific Gravity, Urine: 1.042 — ABNORMAL HIGH
Urobilinogen, UA: 0.2

## 2011-04-03 LAB — CBC
MCHC: 33.4
Platelets: 468 — ABNORMAL HIGH
RDW: 16.3 — ABNORMAL HIGH

## 2011-04-03 LAB — DIFFERENTIAL
Basophils Absolute: 0
Basophils Relative: 0
Neutro Abs: 8.7 — ABNORMAL HIGH
Neutrophils Relative %: 85 — ABNORMAL HIGH

## 2011-04-03 LAB — URINE MICROSCOPIC-ADD ON

## 2011-09-26 ENCOUNTER — Emergency Department (HOSPITAL_COMMUNITY)
Admission: EM | Admit: 2011-09-26 | Discharge: 2011-09-26 | Disposition: A | Discharge status: Home / Self Care | Payer: 59 | Attending: Emergency Medicine | Admitting: Emergency Medicine

## 2011-09-26 ENCOUNTER — Other Ambulatory Visit: Payer: Self-pay

## 2011-09-26 ENCOUNTER — Encounter (HOSPITAL_COMMUNITY): Payer: Self-pay

## 2011-09-26 DIAGNOSIS — R10816 Epigastric abdominal tenderness: Secondary | ICD-10-CM | POA: Insufficient documentation

## 2011-09-26 DIAGNOSIS — Z794 Long term (current) use of insulin: Secondary | ICD-10-CM | POA: Insufficient documentation

## 2011-09-26 DIAGNOSIS — I10 Essential (primary) hypertension: Secondary | ICD-10-CM | POA: Insufficient documentation

## 2011-09-26 DIAGNOSIS — E162 Hypoglycemia, unspecified: Secondary | ICD-10-CM

## 2011-09-26 DIAGNOSIS — E1169 Type 2 diabetes mellitus with other specified complication: Secondary | ICD-10-CM | POA: Insufficient documentation

## 2011-09-26 DIAGNOSIS — R110 Nausea: Secondary | ICD-10-CM

## 2011-09-26 DIAGNOSIS — R5381 Other malaise: Secondary | ICD-10-CM | POA: Insufficient documentation

## 2011-09-26 LAB — URINE MICROSCOPIC-ADD ON

## 2011-09-26 LAB — COMPREHENSIVE METABOLIC PANEL
ALT: 15 U/L (ref 0–35)
Calcium: 9.2 mg/dL (ref 8.4–10.5)
Creatinine, Ser: 0.78 mg/dL (ref 0.50–1.10)
GFR calc Af Amer: >90 mL/min (ref >90)
Glucose, Bld: 66 mg/dL — ABNORMAL LOW (ref 70–99)
Sodium: 135 mEq/L (ref 135–145)
Total Protein: 7.6 g/dL (ref 6.0–8.3)

## 2011-09-26 LAB — CBC
Hemoglobin: 10.9 g/dL — ABNORMAL LOW (ref 12.0–15.0)
MCH: 24.7 pg — ABNORMAL LOW (ref 26.0–34.0)
MCHC: 32.2 g/dL (ref 30.0–36.0)

## 2011-09-26 LAB — URINALYSIS, ROUTINE W REFLEX MICROSCOPIC
Hgb urine dipstick: NEGATIVE
Leukocytes, UA: NEGATIVE
Nitrite: NEGATIVE
Specific Gravity, Urine: 1.022 (ref 1.005–1.030)
Urobilinogen, UA: 0.2 mg/dL (ref 0.0–1.0)

## 2011-09-26 LAB — LIPASE, BLOOD: Lipase: 37 U/L (ref 11–59)

## 2011-09-26 LAB — TROPONIN I: Troponin I: <0.3 ng/mL (ref <0.30)

## 2011-09-26 LAB — PREGNANCY, URINE: Preg Test, Ur: NEGATIVE

## 2011-09-26 LAB — GLUCOSE, CAPILLARY: Glucose-Capillary: 104 mg/dL — ABNORMAL HIGH (ref 70–99)

## 2011-09-26 MED ORDER — ACETAMINOPHEN 325 MG PO TABS
650.0000 mg | ORAL_TABLET | Freq: Once | ORAL | Status: AC
  Administered 2011-09-26: 650 mg via ORAL
  Filled 2011-09-26: qty 2

## 2011-09-26 NOTE — Discharge Instructions (Signed)
You should not take your insulin until you are eating regularly again. Follow up with your primary care doctor in 1-2 days.   Hypoglycemia (Low Blood Sugar) Hypoglycemia is when the glucose (sugar) in your blood is too low. Hypoglycemia can happen for many reasons. It can happen to people with or without diabetes. Hypoglycemia can develop quickly and can be a medical emergency.  CAUSES  Having hypoglycemia does not mean that you will develop diabetes. Different causes include:  Missed or delayed meals or not enough carbohydrates eaten.   Medication overdose. This could be by accident or deliberate. If by accident, your medication may need to be adjusted or changed.   Exercise or increased activity without adjustments in carbohydrates or medications.   A nerve disorder that affects body functions like your heart rate, blood pressure and digestion (autonomic neuropathy).   A condition where the stomach muscles do not function properly (gastroparesis). Therefore, medications may not absorb properly.   The inability to recognize the signs of hypoglycemia (hypoglycemic unawareness).   Absorption of insulin - may be altered.   Alcohol consumption.   Pregnancy/menstrual cycles/postpartum. This may be due to hormones.   Certain kinds of tumors. This is very rare.  SYMPTOMS   Sweating.   Hunger.   Dizziness.   Blurred vision.   Drowsiness.   Weakness.   Headache.   Rapid heart beat.   Shakiness.   Nervousness.  DIAGNOSIS  Diagnosis is made by monitoring blood glucose in one or all of the following ways:  Fingerstick blood glucose monitoring.   Laboratory results.  TREATMENT  If you think your blood glucose is low:  Check your blood glucose, if possible. If it is less than 70 mg/dl, take one of the following:   3-4 glucose tablets.    cup juice (prefer clear like apple).    cup "regular" soda pop.   1 cup milk.   -1 tube of glucose gel.   5-6 hard  candies.   Do not over treat because your blood glucose (sugar) will only go too high.   Wait 15 minutes and recheck your blood glucose. If it is still less than 70 mg/dl (or below your target range), repeat treatment.   Eat a snack if it is more than one hour until your next meal.  Sometimes, your blood glucose may go so low that you are unable to treat yourself. You may need someone to help you. You may even pass out or be unable to swallow. This may require you to get an injection of glucagon, which raises the blood glucose. HOME CARE INSTRUCTIONS  Check blood glucose as recommended by your caregiver.   Take medication as prescribed by your caregiver.   Follow your meal plan. Do not skip meals. Eat on time.   If you are going to drink alcohol, drink it only with meals.   Check your blood glucose before driving.   Check your blood glucose before and after exercise. If you exercise longer or different than usual, be sure to check blood glucose more frequently.   Always carry treatment with you. Glucose tablets are the easiest to carry.   Always wear medical alert jewelry or carry some form of identification that states that you have diabetes. This will alert people that you have diabetes. If you have hypoglycemia, they will have a better idea on what to do.  SEEK MEDICAL CARE IF:   You are having problems keeping your blood sugar at target range.  You are having frequent episodes of hypoglycemia.   You feel you might be having side effects from your medicines.   You have symptoms of an illness that is not improving after 3-4 days.   You notice a change in vision or a new problem with your vision.  SEEK IMMEDIATE MEDICAL CARE IF:   You are a family member or friend of a person whose blood glucose goes below 70 mg/dl and is accompanied by:   Confusion.   A change in mental status.   The inability to swallow.   Passing out.  Document Released: 06/11/2005 Document  Revised: 05/31/2011 Document Reviewed: 02/03/2009 [facility] Patient Information 2012 [place], [place].  RESOURCE GUIDE  Dental Problems  Patients with Medicaid: [facility] [phone]                                           [phone] Phone:  [phone]                                                   Phone:  [phone]  If unable to pay or uninsured, contact:  [facility] or [facility]. to become qualified for the adult dental clinic.  Chronic Pain Problems Contact [facility]  [phone] Patients need to be referred by their primary care doctor.  Insufficient Money for Medicine Contact [employer] Way:  call "211" or [facility] [phone].  No Primary Care Doctor Call [facility]  [phone] Other agencies that provide inexpensive medical care    [facility]  [phone]    [facility]  [phone]    [facility]  [phone]    [facility]  [phone]    [facility]  [phone]    [facility]  [phone]  Psychological Services [facility]  [phone] [facility]  [phone] [facility]   [phone] (emergency services [phone])  Abuse/Neglect [facility] Child Abuse Hotline [phone] [facility] Child Abuse Hotline (762)682-1068 (After Hours)  Emergency Shelter [facility] [phone]  M[facility] Homes [facility] [place] [facility] [phone] [facility] [phone]  MRSA Hotline #:   920-134-8194    [facility] Resources  [facility]  [employer]                           [facility] Dept. [address] [place]                     [address]         [address]  Patrecia Pace  [facility] Phone:  [phone]                                  Phone:  [phone]                   Phone:  [phone]  [facility] Mental [facility] Phone:  [phone]  [facility] Child Abuse Hotline [phone] [phone] (After Hours)

## 2011-09-26 NOTE — ED Notes (Signed)
Pt sitting in stretcher eating.  Encouraged pt for urine sample.

## 2011-09-26 NOTE — ED Notes (Signed)
IV team on the way 

## 2011-09-26 NOTE — ED Notes (Addendum)
Patient given orange juice per MD.   Patient refused to order meal, as her "boyfriend is bringing me some chicken nuggets".  Will continue to monitor.  Dr. Hyman Hopes at bedside for evaluation.

## 2011-09-26 NOTE — ED Notes (Signed)
cbg is 104.

## 2011-09-26 NOTE — ED Notes (Signed)
IV team at bedside 

## 2011-09-26 NOTE — ED Provider Notes (Signed)
History     CSN: [id number]  Arrival date & time 09/26/11  1251   First MD Initiated Contact with Patient 09/26/11 1429      Chief Complaint  Patient presents with  . Hypoglycemia  . Nausea    (Consider location/radiation/quality/duration/timing/severity/associated sxs/prior treatment) HPI  45yoF h/o IDDM pw nausea. She states that for the past 2-3 days she's experienced nausea and fatigue. She reports decreased appetite as well. She reports fatigue. She states that last night she took her normal amount of insulin, Lantus 35 units but only had a small bowl of soup which she did not finished eating. She states she woke up this morning with a pounding headache and more nauseated. She denies abdominal pain. She has had intermittent suprapubic abdominal pain without vaginal discharge.  Denies hematuria/dysuria/freq/urgency. She denies fevers, chills. She came in today because this is how she feels when her glucose is elevated. She states she has not have glucose strips for monitoring.    ED Notes, ED Provider Notes from 09/26/11 0000 to 09/26/11 13:17:43       Cristal Generous, RN 09/26/2011 13:14      Patient presents with weakness and nausea x 2 days with decrease in appetite. Patient took insulin last yesterday.     Past Medical History  Diagnosis Date  . Diabetes mellitus   . Hypertension   . Cataract     Past Surgical History  Procedure Date  . Retinal detachment surgery     No family history on file.  History  Substance Use Topics  . Smoking status: Never Smoker   . Smokeless tobacco: Not on file  . Alcohol Use: No    OB History    Grav Para Term Preterm Abortions TAB SAB Ect Mult Living                  Review of Systems  All other systems reviewed and are negative.   except as noted HPI   Allergies  Review of patient's allergies indicates no known allergies.  Home Medications   Current Outpatient Rx  Name Route Sig Dispense Refill  . INSULIN  ASPART 100 UNIT/ML [place] SOLN Subcutaneous Inject 10 Units into the skin 3 (three) times daily before meals.    . INSULIN GLARGINE 100 UNIT/ML [place] SOLN Subcutaneous Inject 35 Units into the skin at bedtime.    Marland Kitchen LISINOPRIL 10 MG PO TABS Oral Take 10 mg by mouth daily.    Marland Kitchen PREGABALIN 150 MG PO CAPS Oral Take 150 mg by mouth 2 (two) times daily.      BP 116/68  Pulse 92  Temp(Src) 98.4 F (36.9 C) (Oral)  Resp 18  SpO2 98%  LMP 09/03/2011  Physical Exam  Nursing note and vitals reviewed. Constitutional: She is oriented to person, place, and time. She appears well-developed.  HENT:  Head: Atraumatic.  Mouth/Throat: Oropharynx is clear and moist.  Eyes: Conjunctivae and EOM are normal. Pupils are equal, round, and reactive to light.  Neck: Normal range of motion. Neck supple.  Cardiovascular: Normal rate, regular rhythm, normal heart sounds and intact distal pulses.   Pulmonary/Chest: Effort normal and breath sounds normal. No respiratory distress. She has no wheezes. She has no rales.  Abdominal: Soft. She exhibits no distension. There is tenderness. There is no rebound and no guarding.       min epigastric ttp  Musculoskeletal: Normal range of motion.  Neurological: She is alert and oriented to person, place, and time.  Skin: Skin is warm and dry. No rash noted.  Psychiatric: She has a normal mood and affect.    Date: 09/26/2011  Rate: 95  Rhythm: normal sinus rhythm  QRS Axis: normal  Intervals: normal  ST/T Wave abnormalities: normal  Conduction Disutrbances:none  Narrative Interpretation:   Old EKG Reviewed: changes noted no longer tachycardic   ED Course  Procedures (including critical care time)  Labs Reviewed  GLUCOSE, CAPILLARY - Abnormal; Notable for the following:    Glucose-Capillary 53 (*)    All other components within normal limits  CBC - Abnormal; Notable for the following:    Hemoglobin 10.9 (*)    HCT 33.9 (*)    MCV 76.9 (*)    MCH 24.7 (*)    All  other components within normal limits  COMPREHENSIVE METABOLIC PANEL - Abnormal; Notable for the following:    Glucose, Bld 66 (*)    All other components within normal limits  URINALYSIS, ROUTINE W REFLEX MICROSCOPIC - Abnormal; Notable for the following:    Glucose, UA 100 (*)    Ketones, ur 15 (*)    Protein, ur 30 (*)    All other components within normal limits  GLUCOSE, CAPILLARY - Abnormal; Notable for the following:    Glucose-Capillary 104 (*)    All other components within normal limits  URINE MICROSCOPIC-ADD ON - Abnormal; Notable for the following:    Squamous Epithelial / LPF FEW (*)    All other components within normal limits  LIPASE, BLOOD  TROPONIN I  PREGNANCY, URINE   No results found.   1. Hypoglycemia   2. Nausea     MDM  Hypoglycemia, likely second to insulin use in the setting of decreased by mouth intake secondary to nausea. Her abdomen is benign. She is alert and oriented and eating without difficulty. Labs unremarkable. She does complain of mild headache but I doubt intracranial lesion as cause of symptoms. She is neuro-logically intact and feeling better. Glucose improved. We'll have her follow up with her primary care physician. Advised her not to take her insulin until she is eating a normal diet.        Forbes Cellar, MD 09/26/11 [phone]

## 2011-09-26 NOTE — ED Notes (Signed)
Patient presents with weakness and nausea x 2 days with decrease in appetite. Patient took insulin last yesterday.

## 2011-11-16 ENCOUNTER — Emergency Department (HOSPITAL_COMMUNITY)
Admission: EM | Admit: 2011-11-16 | Discharge: 2011-11-16 | Disposition: A | Discharge status: Home / Self Care | Payer: 59 | Attending: Emergency Medicine | Admitting: Emergency Medicine

## 2011-11-16 ENCOUNTER — Encounter (HOSPITAL_COMMUNITY): Payer: Self-pay | Admitting: Emergency Medicine

## 2011-11-16 DIAGNOSIS — Z794 Long term (current) use of insulin: Secondary | ICD-10-CM | POA: Insufficient documentation

## 2011-11-16 DIAGNOSIS — E162 Hypoglycemia, unspecified: Secondary | ICD-10-CM

## 2011-11-16 DIAGNOSIS — I10 Essential (primary) hypertension: Secondary | ICD-10-CM | POA: Insufficient documentation

## 2011-11-16 DIAGNOSIS — R739 Hyperglycemia, unspecified: Secondary | ICD-10-CM

## 2011-11-16 DIAGNOSIS — E119 Type 2 diabetes mellitus without complications: Secondary | ICD-10-CM | POA: Insufficient documentation

## 2011-11-16 DIAGNOSIS — Z79899 Other long term (current) drug therapy: Secondary | ICD-10-CM | POA: Insufficient documentation

## 2011-11-16 LAB — URINALYSIS, ROUTINE W REFLEX MICROSCOPIC
Bilirubin Urine: NEGATIVE
Hgb urine dipstick: NEGATIVE
Nitrite: NEGATIVE
Protein, ur: NEGATIVE mg/dL
Specific Gravity, Urine: 1.016 (ref 1.005–1.030)
Urobilinogen, UA: 0.2 mg/dL (ref 0.0–1.0)

## 2011-11-16 LAB — POCT PREGNANCY, URINE: Preg Test, Ur: NEGATIVE

## 2011-11-16 LAB — GLUCOSE, CAPILLARY
Glucose-Capillary: 89 mg/dL (ref 70–99)
Glucose-Capillary: 41 mg/dL — CL (ref 70–99)
Glucose-Capillary: 162 mg/dL — ABNORMAL HIGH (ref 70–99)
Glucose-Capillary: 75 mg/dL (ref 70–99)
Glucose-Capillary: 237 mg/dL — ABNORMAL HIGH (ref 70–99)

## 2011-11-16 MED ORDER — SODIUM CHLORIDE 0.9 % IV SOLN: INTRAVENOUS | Status: DC

## 2011-11-16 MED ORDER — ONDANSETRON 4 MG PO TBDP
4.0000 mg | ORAL_TABLET | Freq: Once | ORAL | Status: AC
  Administered 2011-11-16: 4 mg via ORAL
  Filled 2011-11-16 (×2): qty 1

## 2011-11-16 NOTE — ED Notes (Signed)
Report given to CDU RN prior to transfer for observation

## 2011-11-16 NOTE — ED Notes (Addendum)
Patient complaining of hyperglycemia that started today -- patient reports constant 300's reading all day long.  Last reading at home was 338.  Patient states that she normally ranges between 150-200's.  Patient also reporting abdominal pain since yesterday; reports one episode of nausea this morning.  CBG checked in triage -- 89.  Patient reports taking 14 units of Novolog about an hour and a half ago and taking 30 units of Lantus at 2100.

## 2011-11-16 NOTE — Discharge Instructions (Signed)
Do not take excessive amounts of insulin.  Follow up with your doctor to discuss your insulin regimen.   Return for worse symptoms.

## 2011-11-16 NOTE — ED Notes (Signed)
IV team notified unable to IV access aqfter 2 attempts

## 2011-11-16 NOTE — ED Notes (Signed)
Pt's CBG was 162 when I checked it. Dr. Nino Parsley and Theodis Blaze is aware. 4:23am JG

## 2011-11-16 NOTE — ED Notes (Addendum)
CBG:41  Treatment: 15 GM carbohydrate snack  Symptoms: shaky   Follow-up CBG: Time:0339 CBG Result: 75  Possible Reasons for Event: inadequate meal intake  Comments/MD notified: caporissi    Natasha Bence

## 2011-11-16 NOTE — ED Notes (Addendum)
CBG: 38  Treatment: OJ  Symptoms: shaky light headed  Follow-up CBG: Time:0315 CBG Result:41  Possible Reasons for Event: sick and not eating the prior day took 14 units nova log on top of 30 units of lantus  Comments/MD notified: yes    [name], Marijean Heath

## 2011-11-16 NOTE — ED Notes (Signed)
Pt's CBG was 41 when I checked it at 3:15am then at 3:35 it was 75.

## 2011-11-16 NOTE — ED Provider Notes (Signed)
History     CSN: [id number]  Arrival date & time 11/16/11  0206   First MD Initiated Contact with Patient 11/16/11 [phone]      Chief Complaint  Patient presents with  . Hyperglycemia    (Consider location/radiation/quality/duration/timing/severity/associated sxs/prior treatment) The history is provided by the patient and a significant other.  she has iddm.  She took her normal lantus and regular insulin last night.  When her boyfriend came home this am, he thought she did not look good.  They checked her BG, it was > 300. Therefore, she took another dose of regular insulin.  14 units.   Then her bg became low.  It was 38.  She denies sxs now. She denies recent illness. She denies missing any meals .  She denies SI.    Past Medical History  Diagnosis Date  . Diabetes mellitus   . Hypertension   . Cataract     Past Surgical History  Procedure Date  . Retinal detachment surgery     History reviewed. No pertinent family history.  History  Substance Use Topics  . Smoking status: Never Smoker   . Smokeless tobacco: Not on file  . Alcohol Use: No    OB History    Grav Para Term Preterm Abortions TAB SAB Ect Mult Living                  Review of Systems  Constitutional: Negative for fever and chills.  HENT: Negative for congestion.   Eyes: Negative for visual disturbance.  Respiratory: Negative for cough and shortness of breath.   Cardiovascular: Negative for chest pain.  Gastrointestinal: Positive for nausea. Negative for vomiting and abdominal pain.  Genitourinary: Negative for dysuria.  Neurological: Positive for weakness. Negative for seizures and headaches.  Psychiatric/Behavioral: Negative for confusion.  All other systems reviewed and are negative.    Allergies  Review of patient's allergies indicates no known allergies.  Home Medications   Current Outpatient Rx  Name Route Sig Dispense Refill  . INSULIN ASPART 100 UNIT/ML [place] SOLN Subcutaneous Inject 10  Units into the skin 3 (three) times daily before meals.    . INSULIN GLARGINE 100 UNIT/ML [place] SOLN Subcutaneous Inject 35 Units into the skin at bedtime.    Marland Kitchen LISINOPRIL 10 MG PO TABS Oral Take 10 mg by mouth daily.    Marland Kitchen PREGABALIN 150 MG PO CAPS Oral Take 150 mg by mouth 2 (two) times daily.      BP 98/52  Pulse 81  Temp(Src) 98.1 F (36.7 C) (Oral)  Resp 18  SpO2 98%  LMP 10/29/2011  Physical Exam  Vitals reviewed. Constitutional: She is oriented to person, place, and time. She appears well-developed and well-nourished. No distress.  HENT:  Head: Normocephalic and atraumatic.  Eyes: Conjunctivae are normal. Pupils are equal, round, and reactive to light.  Neck: Normal range of motion. Neck supple.  Cardiovascular: Normal rate.   No murmur heard. Pulmonary/Chest: Effort normal. She has no wheezes. She has no rales.  Abdominal: Soft. She exhibits no distension. There is no tenderness. There is no guarding.  Musculoskeletal: Normal range of motion.  Neurological: She is alert and oriented to person, place, and time.  Skin: Skin is warm and dry.  Psychiatric: She has a normal mood and affect. Thought content normal.    ED Course  Procedures (including critical care time) Iatrogenic hypoglycemia. Improved after tx in ed with food.  asx now.  Will feed and monitor with  hourly CBG until 7 am.   Labs Reviewed  URINALYSIS, ROUTINE W REFLEX MICROSCOPIC - Abnormal; Notable for the following:    APPearance CLOUDY (*)    Glucose, UA 500 (*)    All other components within normal limits  GLUCOSE, CAPILLARY - Abnormal; Notable for the following:    Glucose-Capillary 38 (*)    All other components within normal limits  GLUCOSE, CAPILLARY - Abnormal; Notable for the following:    Glucose-Capillary 41 (*)    All other components within normal limits  GLUCOSE, CAPILLARY  POCT PREGNANCY, URINE  GLUCOSE, CAPILLARY   No results found.   No diagnosis found.    MDM   IDDM Iatrogenic hypoglycemia - resolved        Cheri Guppy, MD 11/16/11 [phone]

## 2012-03-21 ENCOUNTER — Encounter (HOSPITAL_COMMUNITY): Payer: Self-pay | Admitting: *Deleted

## 2012-03-21 ENCOUNTER — Emergency Department (HOSPITAL_COMMUNITY)
Admission: EM | Admit: 2012-03-21 | Discharge: 2012-03-21 | Disposition: A | Discharge status: Home / Self Care | Payer: 59 | Attending: Emergency Medicine | Admitting: Emergency Medicine

## 2012-03-21 DIAGNOSIS — E109 Type 1 diabetes mellitus without complications: Secondary | ICD-10-CM | POA: Insufficient documentation

## 2012-03-21 DIAGNOSIS — Z794 Long term (current) use of insulin: Secondary | ICD-10-CM | POA: Insufficient documentation

## 2012-03-21 DIAGNOSIS — I10 Essential (primary) hypertension: Secondary | ICD-10-CM | POA: Insufficient documentation

## 2012-03-21 DIAGNOSIS — Z79899 Other long term (current) drug therapy: Secondary | ICD-10-CM | POA: Insufficient documentation

## 2012-03-21 DIAGNOSIS — R739 Hyperglycemia, unspecified: Secondary | ICD-10-CM

## 2012-03-21 LAB — BASIC METABOLIC PANEL
CO2: 24 mEq/L (ref 19–32)
Calcium: 9.1 mg/dL (ref 8.4–10.5)
Creatinine, Ser: 0.81 mg/dL (ref 0.50–1.10)
GFR calc non Af Amer: 87 mL/min — ABNORMAL LOW (ref >90)
Glucose, Bld: 539 mg/dL — ABNORMAL HIGH (ref 70–99)

## 2012-03-21 LAB — GLUCOSE, CAPILLARY: Glucose-Capillary: 341 mg/dL — ABNORMAL HIGH (ref 70–99)

## 2012-03-21 MED ORDER — SODIUM CHLORIDE 0.9 % IV BOLUS (SEPSIS): 1000.0000 mL | Freq: Once | INTRAVENOUS | Status: DC

## 2012-03-21 MED ORDER — INSULIN REGULAR HUMAN 100 UNIT/ML IJ SOLN: 10.0000 [IU] | Freq: Once | INTRAMUSCULAR | Status: DC

## 2012-03-21 MED ORDER — SODIUM CHLORIDE 0.9 % IV BOLUS (SEPSIS)
1000.0000 mL | Freq: Once | INTRAVENOUS | Status: AC
  Administered 2012-03-21: 1000 mL via INTRAVENOUS

## 2012-03-21 MED ORDER — INSULIN ASPART 100 UNIT/ML ~~LOC~~ SOLN
10.0000 [IU] | Freq: Once | SUBCUTANEOUS | Status: AC
  Administered 2012-03-21: 10 [IU] via SUBCUTANEOUS
  Filled 2012-03-21: qty 1

## 2012-03-21 NOTE — Progress Notes (Signed)
Previous note about removal midline catheter charted on wrong patient. VSandritter RN/VABC

## 2012-03-21 NOTE — ED Notes (Signed)
Pt from home c/o confusion and hyperglycemia. Pt able to answer orientation questions but sts that she is confused about life. Patient emotional at this time sts that she makes her insulin last because she can't afford the vials.

## 2012-03-21 NOTE — ED Notes (Addendum)
Pt CBG 517 at this time. Pt sts she feels like she might have UTI at this time.

## 2012-03-21 NOTE — ED Provider Notes (Signed)
History     CSN: [id number]  Arrival date & time 03/21/12  1535   First MD Initiated Contact with Patient 03/21/12 1607      Chief Complaint  Patient presents with  . Hyperglycemia     HPI Pt from home c/o hyperglycemia. Pt able to answer orientation questions but sts that she is confused about life. Patient emotional at this time sts that she makes her insulin last because she can't afford the vials.  Past Medical History  Diagnosis Date  . Diabetes mellitus   . Hypertension   . Cataract     Past Surgical History  Procedure Date  . Retinal detachment surgery     No family history on file.  History  Substance Use Topics  . Smoking status: Never Smoker   . Smokeless tobacco: Not on file  . Alcohol Use: No    OB History    Grav Para Term Preterm Abortions TAB SAB Ect Mult Living                  Review of Systems  All other systems reviewed and are negative.    Allergies  Anesthetics, amide  Home Medications   Current Outpatient Rx  Name Route Sig Dispense Refill  . FUROSEMIDE 80 MG PO TABS Oral Take 80 mg by mouth daily as needed. Fluid retention    . INSULIN ASPART 100 UNIT/ML [place] SOLN Subcutaneous Inject 8-15 Units into the skin 3 (three) times daily before meals. Per sliding scale    . INSULIN GLARGINE 100 UNIT/ML [place] SOLN Subcutaneous Inject 20 Units into the skin at bedtime.     Marland Kitchen LISINOPRIL 10 MG PO TABS Oral Take 10 mg by mouth daily.    Marland Kitchen NAPROXEN SODIUM 220 MG PO TABS Oral Take 440 mg by mouth daily as needed. For pain    . PREGABALIN 150 MG PO CAPS Oral Take 150 mg by mouth 2 (two) times daily.    Marland Kitchen PROMETHAZINE HCL 25 MG PO TABS Oral Take 25 mg by mouth every 6 (six) hours as needed. nausea      BP 133/65  Pulse 93  Temp 98.4 F (36.9 C) (Oral)  Resp 17  Ht 5\' 2"  (1.575 m)  Wt 137 lb (62.143 kg)  BMI 25.06 kg/m2  SpO2 100%  LMP 03/21/2012  Physical Exam  Nursing note and vitals reviewed. Constitutional: She is oriented to person,  place, and time. She appears well-developed. No distress.  HENT:  Head: Normocephalic and atraumatic.  Eyes: Pupils are equal, round, and reactive to light.  Neck: Normal range of motion.  Cardiovascular: Normal rate and intact distal pulses.   Pulmonary/Chest: No respiratory distress.  Abdominal: Normal appearance. She exhibits no distension. There is no tenderness. There is no rebound.  Musculoskeletal: Normal range of motion.  Neurological: She is alert and oriented to person, place, and time. No cranial nerve deficit.  Skin: Skin is warm and dry. No rash noted.  Psychiatric: She has a normal mood and affect. Her behavior is normal.    ED Course  Procedures (including critical care time) Scheduled Meds:    . insulin aspart  10 Units Subcutaneous Once  . sodium chloride  1,000 mL Intravenous Once  . sodium chloride  1,000 mL Intravenous Once  . DISCONTD: insulin regular  10 Units Subcutaneous Once   Continuous Infusions:  PRN Meds:.  Labs Reviewed  BASIC METABOLIC PANEL - Abnormal; Notable for the following:    Sodium 128 (*)  Potassium 5.5 (*)  MODERATE HEMOLYSIS   Chloride 92 (*)     Glucose, Bld 539 (*)     GFR calc non Af Amer 87 (*)     All other components within normal limits  GLUCOSE, CAPILLARY - Abnormal; Notable for the following:    Glucose-Capillary 341 (*)     All other components within normal limits  GLUCOSE, CAPILLARY - Abnormal; Notable for the following:    Glucose-Capillary 268 (*)     All other components within normal limits   No results found.   1. DIABETES MELLITUS, TYPE I   2. Hyperglycemia       MDM         Nelia Shi, MD 03/21/12 [phone]

## 2012-03-21 NOTE — ED Notes (Signed)
Able to retrieve lab draw, but unsuccessful twice to place IV site, IV Team notified.

## 2012-03-21 NOTE — Progress Notes (Signed)
Midline catheter removed  Per order for DVT right IJ - 20cm removed without difficulty - pressure to site x 59m minuted, vaseline gauze dsg to site. VSandritter RN/VABC

## 2012-03-23 ENCOUNTER — Inpatient Hospital Stay (HOSPITAL_COMMUNITY)
Admission: EM | Admit: 2012-03-23 | Discharge: 2012-03-28 | DRG: 639 | Disposition: A | Discharge status: Home / Self Care | Payer: 59 | Attending: Internal Medicine | Admitting: Internal Medicine

## 2012-03-23 ENCOUNTER — Encounter (HOSPITAL_COMMUNITY): Payer: Self-pay | Admitting: Family Medicine

## 2012-03-23 ENCOUNTER — Inpatient Hospital Stay (HOSPITAL_COMMUNITY): Payer: 59

## 2012-03-23 DIAGNOSIS — J309 Allergic rhinitis, unspecified: Secondary | ICD-10-CM

## 2012-03-23 DIAGNOSIS — Z9114 Patient's other noncompliance with medication regimen: Secondary | ICD-10-CM

## 2012-03-23 DIAGNOSIS — R109 Unspecified abdominal pain: Secondary | ICD-10-CM

## 2012-03-23 DIAGNOSIS — D649 Anemia, unspecified: Secondary | ICD-10-CM | POA: Diagnosis present

## 2012-03-23 DIAGNOSIS — Z91199 Patient's noncompliance with other medical treatment and regimen due to unspecified reason: Secondary | ICD-10-CM

## 2012-03-23 DIAGNOSIS — Z791 Long term (current) use of non-steroidal anti-inflammatories (NSAID): Secondary | ICD-10-CM

## 2012-03-23 DIAGNOSIS — E1010 Type 1 diabetes mellitus with ketoacidosis without coma: Principal | ICD-10-CM | POA: Diagnosis present

## 2012-03-23 DIAGNOSIS — Z884 Allergy status to anesthetic agent status: Secondary | ICD-10-CM

## 2012-03-23 DIAGNOSIS — Z794 Long term (current) use of insulin: Secondary | ICD-10-CM

## 2012-03-23 DIAGNOSIS — Z9119 Patient's noncompliance with other medical treatment and regimen: Secondary | ICD-10-CM

## 2012-03-23 DIAGNOSIS — Z87448 Personal history of other diseases of urinary system: Secondary | ICD-10-CM

## 2012-03-23 DIAGNOSIS — E1110 Type 2 diabetes mellitus with ketoacidosis without coma: Secondary | ICD-10-CM

## 2012-03-23 DIAGNOSIS — I10 Essential (primary) hypertension: Secondary | ICD-10-CM | POA: Diagnosis present

## 2012-03-23 DIAGNOSIS — E109 Type 1 diabetes mellitus without complications: Secondary | ICD-10-CM

## 2012-03-23 DIAGNOSIS — R197 Diarrhea, unspecified: Secondary | ICD-10-CM | POA: Diagnosis present

## 2012-03-23 DIAGNOSIS — D509 Iron deficiency anemia, unspecified: Secondary | ICD-10-CM

## 2012-03-23 DIAGNOSIS — E876 Hypokalemia: Secondary | ICD-10-CM | POA: Diagnosis present

## 2012-03-23 DIAGNOSIS — E860 Dehydration: Secondary | ICD-10-CM

## 2012-03-23 LAB — CBC
MCH: 22.9 pg — ABNORMAL LOW (ref 26.0–34.0)
MCHC: 30.7 g/dL (ref 30.0–36.0)
MCV: 74.5 fL — ABNORMAL LOW (ref 78.0–100.0)
Platelets: 397 10*3/uL (ref 150–400)

## 2012-03-23 LAB — BLOOD GAS, VENOUS
Acid-base deficit: 13.3 mmol/L — ABNORMAL HIGH (ref 0.0–2.0)
O2 Saturation: 65.5 %
Patient temperature: 98.6
pO2, Ven: 41.2 mmHg (ref 30.0–45.0)

## 2012-03-23 LAB — URINALYSIS, ROUTINE W REFLEX MICROSCOPIC
Leukocytes, UA: NEGATIVE
Protein, ur: NEGATIVE mg/dL
Specific Gravity, Urine: 1.025 (ref 1.005–1.030)
Urobilinogen, UA: 0.2 mg/dL (ref 0.0–1.0)

## 2012-03-23 LAB — GLUCOSE, CAPILLARY: Glucose-Capillary: 380 mg/dL — ABNORMAL HIGH (ref 70–99)

## 2012-03-23 LAB — COMPREHENSIVE METABOLIC PANEL
ALT: 10 U/L (ref 0–35)
AST: 16 U/L (ref 0–37)
Calcium: 9.1 mg/dL (ref 8.4–10.5)
Creatinine, Ser: 0.9 mg/dL (ref 0.50–1.10)
GFR calc Af Amer: 89 mL/min — ABNORMAL LOW (ref >90)
Glucose, Bld: 529 mg/dL — ABNORMAL HIGH (ref 70–99)
Sodium: 128 mEq/L — ABNORMAL LOW (ref 135–145)
Total Protein: 7.5 g/dL (ref 6.0–8.3)

## 2012-03-23 LAB — URINE MICROSCOPIC-ADD ON

## 2012-03-23 MED ORDER — SODIUM CHLORIDE 0.9 % IV BOLUS (SEPSIS)
1000.0000 mL | Freq: Once | INTRAVENOUS | Status: AC
  Administered 2012-03-23: 1000 mL via INTRAVENOUS

## 2012-03-23 MED ORDER — ONDANSETRON HCL 4 MG/2ML IJ SOLN: 4.0000 mg | Freq: Four times a day (QID) | INTRAMUSCULAR | Status: DC | PRN

## 2012-03-23 MED ORDER — ONDANSETRON HCL 4 MG/2ML IJ SOLN
4.0000 mg | Freq: Once | INTRAMUSCULAR | Status: AC
  Administered 2012-03-23: 4 mg via INTRAVENOUS
  Filled 2012-03-23: qty 2

## 2012-03-23 MED ORDER — DEXTROSE-NACL 5-0.45 % IV SOLN: INTRAVENOUS | Status: DC

## 2012-03-23 MED ORDER — SODIUM CHLORIDE 0.9 % IV SOLN
INTRAVENOUS | Status: DC
  Administered 2012-03-24: via INTRAVENOUS

## 2012-03-23 MED ORDER — MORPHINE SULFATE 4 MG/ML IJ SOLN
4.0000 mg | Freq: Once | INTRAMUSCULAR | Status: AC
  Administered 2012-03-23: 4 mg via INTRAVENOUS
  Filled 2012-03-23: qty 1

## 2012-03-23 MED ORDER — SODIUM CHLORIDE 0.9 % IV SOLN
INTRAVENOUS | Status: DC
  Administered 2012-03-23: 4.2 [IU]/h via INTRAVENOUS
  Filled 2012-03-23: qty 1

## 2012-03-23 MED ORDER — ONDANSETRON HCL 4 MG/2ML IJ SOLN: 4.0000 mg | Freq: Once | INTRAMUSCULAR | Status: DC

## 2012-03-23 MED ORDER — DEXTROSE 50 % IV SOLN: 25.0000 mL | INTRAVENOUS | Status: DC | PRN

## 2012-03-23 MED ORDER — MORPHINE SULFATE 4 MG/ML IJ SOLN: 4.0000 mg | INTRAMUSCULAR | Status: DC | PRN

## 2012-03-23 NOTE — H&P (Addendum)
History and Physical  [name] YNW:[id number] DOB: 13-Oct-1965 DOA: 03/23/2012  Referring physician: Gerhard Munch, Md PCP: Lonia Blood, MD   Chief Complaint: high blood sugar  HPI:  46 year old woman PMH DM type 1 presented to ED with abdominal pain, cramping starting today. CBG 529. Evaluation in ED revealed DKA and patient was referred for admission.  Patient has had difficulty affording Lantus and has been stretching her supply. Last dose of Lantus was 9/27. Did take some Novolog this morning. Does not check blood sugars often but has noted that they have been high. She started feeling poorly 3 days ago but only developed abdominal cramping, nausea and vomiting today. Last ate this AM. Abdominal pain in generalized and intense, she reports similar symptoms with DKA in the past. Currently menstruating.   In ED was noted HR 110, vitals otherwise stable. No temperature documented. CO2 11, Hgb 9.8. No EDP note available for review but case discussed with Dr. Vernie Murders pain felt to be benign. RN notes patient has been up to bathroom twice and ambulated without difficulty.  Chart Review:  03/21/12 ED visit--seen for hyperglycemia. Apparently discharged home.  Review of Systems:  Negative for fever, visual changes, sore throat, rash, new muscle aches, chest pain, SOB, dysuria, bleeding.  Positive for diarrhea.  Past Medical History  Diagnosis Date  . Diabetes mellitus   . Hypertension   . Cataract     Past Surgical History  Procedure Date  . Retinal detachment surgery     Social History:  reports that she has never smoked. She does not have any smokeless tobacco history on file. She reports that she does not drink alcohol or use illicit drugs.  Allergies  Allergen Reactions  . Anesthetics, Amide Nausea And Vomiting    Family History  Problem Relation Age of Onset  . Hypertension Mother      Prior to Admission medications   Medication Sig Start Date End  Date Taking? Authorizing Provider  furosemide (LASIX) 80 MG tablet Take 80 mg by mouth daily as needed. Fluid retention   Yes Historical Provider, MD  insulin aspart (NOVOLOG) 100 UNIT/ML injection Inject 8-15 Units into the skin 3 (three) times daily before meals. Per sliding scale   Yes Historical Provider, MD  insulin glargine (LANTUS) 100 UNIT/ML injection Inject 20 Units into the skin at bedtime.    Yes Historical Provider, MD  lisinopril (PRINIVIL,ZESTRIL) 10 MG tablet Take 10 mg by mouth daily.   Yes Historical Provider, MD  naproxen sodium (ANAPROX) 220 MG tablet Take 440 mg by mouth daily as needed. For pain   Yes Historical Provider, MD  pregabalin (LYRICA) 150 MG capsule Take 150 mg by mouth 2 (two) times daily.   Yes Historical Provider, MD  promethazine (PHENERGAN) 25 MG tablet Take 25 mg by mouth every 6 (six) hours as needed. nausea   Yes Historical Provider, MD   Physical Exam: Filed Vitals:   03/23/12 2042 03/23/12 2135  BP:  145/65  Pulse:  110  Resp:  18  SpO2: 100% 100%    General:  Examined in ED. Appears calm, uncomfortable, non-toxic. Eyes: pupils, lids, irises & conjunctiva appear grossly normal ENT: grossly normal hearing, lips & tongue Neck: no LAD, masses or thyromegaly Cardiovascular: tachycardic, regular rhythm, no m/r/g. No LE edema. Respiratory: CTA bilaterally, no w/r/r. Normal respiratory effort. Abdomen: soft, non-distended. Decreased bowel sounds. Careful exam does not illicit focal tenderness. Appears benign without guarding. Appears to settle down when not focused on Skin: no  rash or induration seen  Musculoskeletal: grossly normal tone BUE/BLE Psychiatric: speech fluent and appropriate Neurologic: grossly non-focal.  Wt Readings from Last 3 Encounters:  03/21/12 62.143 kg (137 lb)  08/08/07 60.238 kg (132 lb 12.8 oz)   Labs on Admission:  Basic Metabolic Panel:  Lab 03/23/12 9604 03/21/12 1700  NA 128* 128*  K 4.4 5.5*  CL 93* 92*  CO2  11* 24  GLUCOSE 529* 539*  BUN 15 12  CREATININE 0.90 0.81  CALCIUM 9.1 9.1  MG -- --  PHOS -- --    Liver Function Tests:  Lab 03/23/12 2145  AST 16  ALT 10  ALKPHOS 103  BILITOT 0.6  PROT 7.5  ALBUMIN 3.4*   CBC:  Lab 03/23/12 2145  WBC 14.1*  NEUTROABS --  HGB 9.8*  HCT 31.9*  MCV 74.5*  PLT 397   CBG:  Lab 03/23/12 2146 03/21/12 1948 03/21/12 1843  GLUCAP 478* 268* 341*     Radiological Exams on Admission: Dg Abd Acute W/chest  03/23/2012  *RADIOLOGY REPORT*  Clinical Data: Abdominal pain  ACUTE ABDOMEN SERIES (ABDOMEN 2 VIEW & CHEST 1 VIEW)  Comparison: 03/29/2010  Findings: The lungs are clear without focal infiltrate, edema, pneumothorax or pleural effusion. The cardiopericardial silhouette is within normal limits for size. Imaged bony structures of the thorax are intact. Telemetry leads overlie the chest.  Upright film shows no evidence for intraperitoneal free air.  There is some gas visible in the stomach, but otherwise a paucity of bowel gas is observed. Surgical clips in the right upper quadrant suggest prior cholecystectomy.  Hazy soft tissue attenuation over the central pelvis may be related to a distended bladder. Visualized bony structures are unremarkable.  IMPRESSION: No acute cardiopulmonary process.  No intraperitoneal free air.  No gaseous bowel dilatation to suggest obstruction.  A paucity of bowel gas may be related to fluid-filled bowel loops.   Original Report Authenticated By: ERIC A. MANSELL, M.D.     EKG: Independently reviewed. ST, no acute changes.   Principal Problem:  *DKA (diabetic ketoacidoses) Active Problems:  DIABETES MELLITUS, TYPE I  Dehydration  Abdominal pain  Microcytic anemia  Non compliance w medication regimen   Assessment/Plan 1. DKA--aggressive IVF, insulin infusion. Most likely secondary to non-compliance based on history. No evidence of infection. No history to suggest ACS. Exam, history and labs do not suggest  acute intraabdominal pathology. However, will check lipase, EKG, troponin x1 and acute abdominal series--if all unremarkable would not pursue further workup.  2. DM type 1, likely uncontrolled--consult DM RN. Consider alternative to Lantus secondary to cost. 3. Dehydration--IVF. 4. Abdominal pain, n/v--antiemetics, supportive care, otherwise as above. Acute pathology doubted. 5. Microcytic anemia--likely secondary to menses. CBC in AM. 6. Non-compliance--plan as above.  Code Status: Full code Family Communication: none present Disposition Plan: home when improved, likely 48-72 hours.  Time spent: 60 minutes  Brendia Sacks, MD  [facility] Pager [phone]. Night-coverage at [email or web address], password [facility] 03/23/2012, 10:50 PM

## 2012-03-23 NOTE — ED Notes (Signed)
Pt c/o elevated blood sugar.CBG 500 at bedside.pt c/o N/V. Pt was seen by PCP for UTI.

## 2012-03-23 NOTE — ED Notes (Signed)
QMV:[id number]<NG> Expected date:03/23/12<BR> Expected time: 8:15 PM<BR> Means of arrival:Ambulance<BR> Comments:<BR> N/V/Hyperglycemia &gt; 400

## 2012-03-23 NOTE — ED Provider Notes (Addendum)
History     CSN: [id number]  Arrival date & time 03/23/12  2040   First MD Initiated Contact with Patient 03/23/12 2123      Chief Complaint  Patient presents with  . Hyperglycemia     HPI  The patient presents with one day of abdominal pain, chills, nausea, vomiting.  She states that his symptoms began subacutely, since onset has been worsening.  The pain is diffuse, crampy with associated persistent nausea and anorexia.  No relief with anything. The patient notes that she has been unable to consistently use insulin.  She states that since a recent evaluation for DKA she was generally doing well until today.   Past Medical History  Diagnosis Date  . Diabetes mellitus     diagnosed age 46  . Hypertension   . Cataract     Past Surgical History  Procedure Date  . Retinal detachment surgery     Family History  Problem Relation Age of Onset  . Hypertension Mother     History  Substance Use Topics  . Smoking status: Never Smoker   . Smokeless tobacco: Not on file  . Alcohol Use: No    OB History    Grav Para Term Preterm Abortions TAB SAB Ect Mult Living                  Review of Systems  Constitutional: Positive for chills.  HENT: Negative.   Eyes: Negative.   Respiratory: Negative for shortness of breath.   Cardiovascular: Negative for chest pain.  Gastrointestinal: Positive for nausea, vomiting and abdominal pain. Negative for diarrhea.  Genitourinary: Negative for dysuria.  Musculoskeletal: Positive for back pain.  Skin: Negative.   Neurological: Negative for syncope.    Allergies  Anesthetics, amide  Home Medications   Current Outpatient Rx  Name Route Sig Dispense Refill  . FUROSEMIDE 80 MG PO TABS Oral Take 80 mg by mouth daily as needed. Fluid retention    . INSULIN ASPART 100 UNIT/ML [place] SOLN Subcutaneous Inject 8-15 Units into the skin 3 (three) times daily before meals. Per sliding scale    . INSULIN GLARGINE 100 UNIT/ML [place] SOLN  Subcutaneous Inject 20 Units into the skin at bedtime.     Marland Kitchen LISINOPRIL 10 MG PO TABS Oral Take 10 mg by mouth daily.    Marland Kitchen NAPROXEN SODIUM 220 MG PO TABS Oral Take 440 mg by mouth daily as needed. For pain    . PREGABALIN 150 MG PO CAPS Oral Take 150 mg by mouth 2 (two) times daily.    Marland Kitchen PROMETHAZINE HCL 25 MG PO TABS Oral Take 25 mg by mouth every 6 (six) hours as needed. nausea      BP 145/65  Pulse 110  Resp 18  SpO2 100%  LMP 03/21/2012  Physical Exam  Nursing note and vitals reviewed. Constitutional: She is oriented to person, place, and time. She appears well-developed. No distress.  HENT:  Head: Normocephalic and atraumatic.  Eyes: Pupils are equal, round, and reactive to light.  Neck: Normal range of motion.  Cardiovascular: Intact distal pulses.  Tachycardia present.   Pulmonary/Chest: No respiratory distress.  Abdominal: Normal appearance. She exhibits no distension. There is generalized tenderness. There is guarding. There is no rigidity and no rebound.  Musculoskeletal: Normal range of motion.  Neurological: She is alert and oriented to person, place, and time. She has normal strength. She displays no tremor. No cranial nerve deficit. She displays no seizure activity.  Skin: Skin is warm. No rash noted. She is diaphoretic.  Psychiatric: She has a normal mood and affect. Her behavior is normal.    ED Course  Procedures (including critical care time)  Labs Reviewed  CBC - Abnormal; Notable for the following:    WBC 14.1 (*)     Hemoglobin 9.8 (*)     HCT 31.9 (*)     MCV 74.5 (*)     MCH 22.9 (*)     All other components within normal limits  COMPREHENSIVE METABOLIC PANEL - Abnormal; Notable for the following:    Sodium 128 (*)     Chloride 93 (*)     CO2 11 (*)     Glucose, Bld 529 (*)     Albumin 3.4 (*)     GFR calc non Af Amer 77 (*)     GFR calc Af Amer 89 (*)     All other components within normal limits  URINALYSIS, ROUTINE W REFLEX MICROSCOPIC -  Abnormal; Notable for the following:    Glucose, UA >1000 (*)     Hgb urine dipstick LARGE (*)     Ketones, ur >80 (*)     All other components within normal limits  BLOOD GAS, VENOUS - Abnormal; Notable for the following:    pH, Ven 7.225 (*)     pCO2, Ven 32.5 (*)     Bicarbonate 13.0 (*)     Acid-base deficit 13.3 (*)     All other components within normal limits  GLUCOSE, CAPILLARY - Abnormal; Notable for the following:    Glucose-Capillary 478 (*)     All other components within normal limits  URINE MICROSCOPIC-ADD ON - Abnormal; Notable for the following:    Bacteria, UA FEW (*)     All other components within normal limits  PREGNANCY, URINE   No results found.   No diagnosis found.   Cardiac 110 sinus tach abnormal Pulse ox 96% room air normal  Update:  Following initial meds, IVF, the patient notes a mild improvement, but has persistent nausea.  11:24 PM Patient unchanged - still tachy  11:42 PM Patient more comfortable.  HR 111.  Mentation is OK    Date: 03/24/2012  Rate: 113  Rhythm: sinus tachycardia  QRS Axis: normal  Intervals: normal  ST/T Wave abnormalities: nonspecific T wave changes  Conduction Disutrbances:none  Narrative Interpretation:   Old EKG Reviewed: changes noted abnormal   MDM  This female with insulin-dependent diabetes now presents with subacute onset abdominal pain, nausea, vomiting.  Over, the patient has similar prior presentations.  Given this history, her description of pain her initial diaphoretic presentation was initial suspicion of DKA and the patient started insulin stabilizer protocol after an initial Accu-Chek was elevated.  The remainder of the patient's labs are consistent with DKA with acidosis, ketonuria.  She received IVF, analgesics, antiemetics.  On several re-checks she remained uncomfortable appearing, tachycardic.   Given the acuity of her presentation she will be admitted to the step-down unit.  CRITICAL  CARE Performed by: Gerhard Munch   Total critical care time: 35  Critical care time was exclusive of separately billable procedures and treating other patients.  Critical care was necessary to treat or prevent imminent or life-threatening deterioration.  Critical care was time spent personally by me on the following activities: development of treatment plan with patient and/or surrogate as well as nursing, discussions with consultants, evaluation of patient's response to treatment, examination of patient, obtaining  history from patient or surrogate, ordering and performing treatments and interventions, ordering and review of laboratory studies, ordering and review of radiographic studies, pulse oximetry and re-evaluation of patient's condition.        Gerhard Munch, MD 03/23/12 9604  Gerhard Munch, MD 03/23/12 5409  Gerhard Munch, MD 03/24/12 [phone]

## 2012-03-24 ENCOUNTER — Encounter (HOSPITAL_COMMUNITY): Payer: Self-pay | Admitting: *Deleted

## 2012-03-24 LAB — BASIC METABOLIC PANEL
BUN: 12 mg/dL (ref 6–23)
BUN: 12 mg/dL (ref 6–23)
BUN: 10 mg/dL (ref 6–23)
Calcium: 9.1 mg/dL (ref 8.4–10.5)
Calcium: 9.2 mg/dL (ref 8.4–10.5)
Chloride: 108 mEq/L (ref 96–112)
Chloride: 110 mEq/L (ref 96–112)
Chloride: 108 mEq/L (ref 96–112)
Creatinine, Ser: 0.8 mg/dL (ref 0.50–1.10)
Creatinine, Ser: 0.83 mg/dL (ref 0.50–1.10)
Creatinine, Ser: 0.75 mg/dL (ref 0.50–1.10)
GFR calc Af Amer: >90 mL/min (ref >90)
GFR calc Af Amer: >90 mL/min (ref >90)
GFR calc Af Amer: >90 mL/min (ref >90)
GFR calc Af Amer: >90 mL/min (ref >90)
GFR calc non Af Amer: 83 mL/min — ABNORMAL LOW (ref >90)
GFR calc non Af Amer: 88 mL/min — ABNORMAL LOW (ref >90)
GFR calc non Af Amer: >90 mL/min (ref >90)
Glucose, Bld: 354 mg/dL — ABNORMAL HIGH (ref 70–99)
Glucose, Bld: 141 mg/dL — ABNORMAL HIGH (ref 70–99)
Glucose, Bld: 151 mg/dL — ABNORMAL HIGH (ref 70–99)
Potassium: 4.1 mEq/L (ref 3.5–5.1)
Potassium: 3.8 mEq/L (ref 3.5–5.1)
Sodium: 136 mEq/L (ref 135–145)

## 2012-03-24 LAB — GLUCOSE, CAPILLARY
Glucose-Capillary: 128 mg/dL — ABNORMAL HIGH (ref 70–99)
Glucose-Capillary: 129 mg/dL — ABNORMAL HIGH (ref 70–99)
Glucose-Capillary: 132 mg/dL — ABNORMAL HIGH (ref 70–99)
Glucose-Capillary: 153 mg/dL — ABNORMAL HIGH (ref 70–99)
Glucose-Capillary: 179 mg/dL — ABNORMAL HIGH (ref 70–99)
Glucose-Capillary: 207 mg/dL — ABNORMAL HIGH (ref 70–99)
Glucose-Capillary: 243 mg/dL — ABNORMAL HIGH (ref 70–99)
Glucose-Capillary: 374 mg/dL — ABNORMAL HIGH (ref 70–99)
Glucose-Capillary: 500 mg/dL — ABNORMAL HIGH (ref 70–99)
Glucose-Capillary: 517 mg/dL — ABNORMAL HIGH (ref 70–99)

## 2012-03-24 LAB — TROPONIN I: Troponin I: <0.3 ng/mL (ref <0.30)

## 2012-03-24 LAB — LIPASE, BLOOD: Lipase: 16 U/L (ref 11–59)

## 2012-03-24 MED ORDER — POTASSIUM CHLORIDE 10 MEQ/100ML IV SOLN
10.0000 meq | INTRAVENOUS | Status: AC
  Administered 2012-03-24: 10 meq via INTRAVENOUS
  Filled 2012-03-24: qty 100

## 2012-03-24 MED ORDER — METOCLOPRAMIDE HCL 5 MG/ML IJ SOLN
5.0000 mg | Freq: Four times a day (QID) | INTRAMUSCULAR | Status: DC
  Administered 2012-03-24 – 2012-03-25 (×3): 5 mg via INTRAVENOUS
  Filled 2012-03-24 (×2): qty 2
  Filled 2012-03-24 (×2): qty 1
  Filled 2012-03-24: qty 2
  Filled 2012-03-24 (×2): qty 1

## 2012-03-24 MED ORDER — MORPHINE SULFATE 2 MG/ML IJ SOLN
2.0000 mg | INTRAMUSCULAR | Status: DC | PRN
  Administered 2012-03-24: 2 mg via INTRAVENOUS
  Filled 2012-03-24: qty 1

## 2012-03-24 MED ORDER — MORPHINE SULFATE 2 MG/ML IJ SOLN
INTRAMUSCULAR | Status: AC
  Administered 2012-03-24: 2 mg via INTRAVENOUS
  Filled 2012-03-24: qty 1

## 2012-03-24 MED ORDER — SODIUM CHLORIDE 0.9 % IV SOLN
INTRAVENOUS | Status: DC
  Administered 2012-03-24 – 2012-03-25 (×2): via INTRAVENOUS

## 2012-03-24 MED ORDER — ACETAMINOPHEN 325 MG PO TABS: 650.0000 mg | ORAL_TABLET | Freq: Four times a day (QID) | ORAL | Status: DC | PRN

## 2012-03-24 MED ORDER — SODIUM CHLORIDE 0.9 % IV SOLN
INTRAVENOUS | Status: DC
  Administered 2012-03-24: 11:00:00 via INTRAVENOUS
  Filled 2012-03-24 (×2): qty 1

## 2012-03-24 MED ORDER — MORPHINE SULFATE 4 MG/ML IJ SOLN
3.0000 mg | INTRAMUSCULAR | Status: DC | PRN
  Administered 2012-03-24 – 2012-03-25 (×4): 3 mg via INTRAVENOUS
  Filled 2012-03-24 (×4): qty 1

## 2012-03-24 MED ORDER — LORAZEPAM 2 MG/ML IJ SOLN
1.0000 mg | Freq: Three times a day (TID) | INTRAMUSCULAR | Status: DC | PRN
  Administered 2012-03-24 – 2012-03-26 (×5): 1 mg via INTRAVENOUS
  Filled 2012-03-24 (×6): qty 1

## 2012-03-24 MED ORDER — ONDANSETRON HCL 4 MG/2ML IJ SOLN
4.0000 mg | INTRAMUSCULAR | Status: DC | PRN
  Administered 2012-03-24 – 2012-03-27 (×7): 4 mg via INTRAVENOUS
  Filled 2012-03-24 (×7): qty 2

## 2012-03-24 MED ORDER — DEXTROSE-NACL 5-0.45 % IV SOLN
INTRAVENOUS | Status: DC
  Administered 2012-03-24 – 2012-03-25 (×3): via INTRAVENOUS

## 2012-03-24 MED ORDER — MORPHINE SULFATE 2 MG/ML IJ SOLN
2.0000 mg | INTRAMUSCULAR | Status: DC | PRN
  Administered 2012-03-24 (×3): 2 mg via INTRAVENOUS
  Filled 2012-03-24 (×2): qty 1

## 2012-03-24 MED ORDER — INFLUENZA VIRUS VACC SPLIT PF IM SUSP
0.5000 mL | INTRAMUSCULAR | Status: AC
  Administered 2012-03-25: 0.5 mL via INTRAMUSCULAR
  Filled 2012-03-24: qty 0.5

## 2012-03-24 MED ORDER — ONDANSETRON HCL 4 MG/2ML IJ SOLN
4.0000 mg | Freq: Four times a day (QID) | INTRAMUSCULAR | Status: DC | PRN
  Administered 2012-03-24 (×2): 4 mg via INTRAVENOUS
  Filled 2012-03-24: qty 2

## 2012-03-24 MED ORDER — DEXTROSE 50 % IV SOLN
25.0000 mL | INTRAVENOUS | Status: DC | PRN
  Filled 2012-03-24: qty 50

## 2012-03-24 NOTE — Progress Notes (Signed)
[facility] PROGRESS NOTE  [name] UJW:[id number] DOB: 01-24-1966 DOA: 03/23/2012 PCP: Lonia Blood, MD  Assessment/Plan: 1. DKA--Will continue aggressive IVF, insulin infusion. Most likely secondary to non-compliance based on history. No evidence of infection. No history to suggest ACS and with negative CE'z. Negative lipase. Will provide pain meds and antiemetics as needed. Gap is still open and bicarb 17; will follow electrolytes and gap closely before transitioning off the insulin drip.  2. DM type 1, likely uncontrolled--Will check A1C; once DKA controlled will try to arranged regimen with rapid novolog TID and 70/30 BId (due to cost of lantus). 3. Dehydration--IVF. 4. Abdominal pain, n/v--antiemetics, supportive care. Will start reglan; patient with hx of gastroparesis. 5. Microcytic anemia--likely secondary to menses. CBC in AM. Will start iron supplements once taking PO again; no transfusion needed at this point. 6. Non-compliance--plan as above.   Code Status: Full Family Communication: no family at bedside Disposition Plan: home when medically stable   Brief narrative:  46 year old woman PMH DM type 1 presented to ED with abdominal pain, cramping starting today. CBG 529. Evaluation in ED revealed DKA and patient was referred for admission.  Consultants:  Diabetes coordinator  Antibiotics:  none  HPI/Subjective: Afebrile; complaining of abdominal pain and anxiety. Positive nausea, no CP.   Objective: Filed Vitals:   03/24/12 0009 03/24/12 0119 03/24/12 0535 03/24/12 1407  BP: 135/75 131/62 147/76 168/64  Pulse: 117 123 114 111  Temp:  98.3 F (36.8 C) 98.1 F (36.7 C) 98.5 F (36.9 C)  TempSrc:  Oral Oral Oral  Resp: 8 18 20 20   Height:  5\' 2"  (1.575 m)    Weight:  59.24 kg (130 lb 9.6 oz)    SpO2:  100% 100% 100%    Intake/Output Summary (Last 24 hours) at 03/24/12 1549 Last data filed at 03/24/12 1137  Gross per 24 hour  Intake      0 ml    Output    900 ml  Net   -900 ml   Filed Weights   03/24/12 0119  Weight: 59.24 kg (130 lb 9.6 oz)    Exam:   General:  Acute distress secondary to abdominal discomfort and nausea; afebrile  Cardiovascular: tachycardic, no rubs or gallops  Respiratory: CTA bilaterally  Abdomen: soft, ND; diffuse tenderness to palpation, no guarding  Neuro: non focal deficit  Data Reviewed: Basic Metabolic Panel:  Lab 03/24/12 5621 03/24/12 0630 03/24/12 0511 03/24/12 0212 03/23/12 2145  NA 140 140 139 136 128*  K 3.8 4.1 4.1 4.1 4.4  CL 110 108 107 102 93*  CO2 17* 16* 12* 10* 11*  GLUCOSE 141* 201* 238* 354* 529*  BUN 12 12 12 13 15   CREATININE 0.79 0.83 0.80 0.84 0.90  CALCIUM 9.5 9.2 9.5 9.1 9.1  MG -- -- -- -- --  PHOS -- -- -- -- --   Liver Function Tests:  Lab 03/23/12 2145  AST 16  ALT 10  ALKPHOS 103  BILITOT 0.6  PROT 7.5  ALBUMIN 3.4*    Lab 03/23/12 2145  LIPASE 16  AMYLASE --   CBC:  Lab 03/23/12 2145  WBC 14.1*  NEUTROABS --  HGB 9.8*  HCT 31.9*  MCV 74.5*  PLT 397   Cardiac Enzymes:  Lab 03/24/12 0212  CKTOTAL --  CKMB --  CKMBINDEX --  TROPONINI <0.30   CBG:  Lab 03/24/12 1529 03/24/12 1423 03/24/12 1317 03/24/12 1146 03/24/12 1037  GLUCAP 147* 133* 128* 132* 137*  Studies: Dg Abd Acute W/chest  03/23/2012  *RADIOLOGY REPORT*  Clinical Data: Abdominal pain  ACUTE ABDOMEN SERIES (ABDOMEN 2 VIEW & CHEST 1 VIEW)  Comparison: 03/29/2010  Findings: The lungs are clear without focal infiltrate, edema, pneumothorax or pleural effusion. The cardiopericardial silhouette is within normal limits for size. Imaged bony structures of the thorax are intact. Telemetry leads overlie the chest.  Upright film shows no evidence for intraperitoneal free air.  There is some gas visible in the stomach, but otherwise a paucity of bowel gas is observed. Surgical clips in the right upper quadrant suggest prior cholecystectomy.  Hazy soft tissue attenuation over  the central pelvis may be related to a distended bladder. Visualized bony structures are unremarkable.  IMPRESSION: No acute cardiopulmonary process.  No intraperitoneal free air.  No gaseous bowel dilatation to suggest obstruction.  A paucity of bowel gas may be related to fluid-filled bowel loops.   Original Report Authenticated By: ERIC A. MANSELL, M.D.     Scheduled Meds:   . influenza  inactive virus vaccine  0.5 mL Intramuscular Tomorrow-1000  . metoCLOPramide (REGLAN) injection  5 mg Intravenous Q6H  .  morphine injection  4 mg Intravenous Once  . ondansetron (ZOFRAN) IV  4 mg Intravenous Once  . potassium chloride  10 mEq Intravenous Q1H  . sodium chloride  1,000 mL Intravenous Once  . DISCONTD: ondansetron (ZOFRAN) IV  4 mg Intravenous Once   Continuous Infusions:   . sodium chloride 150 mL/hr at 03/24/12 0113  . dextrose 5 % and 0.45% NaCl 125 mL/hr at 03/24/12 1445  . insulin (NOVOLIN-R) infusion    . DISCONTD: sodium chloride 125 mL/hr at 03/24/12 0015  . DISCONTD: dextrose 5 % and 0.45% NaCl    . DISCONTD: insulin (NOVOLIN-R) infusion 6.3 Units/hr (03/24/12 0043)    Time spent: > 30 minutes    Gerrard Crystal  [facility] Pager [phone]. If 8PM-8AM, please contact night-coverage at [email or web address], password [email or web address] 03/24/2012, 3:49 PM  LOS: 1 day

## 2012-03-24 NOTE — Progress Notes (Signed)
Attempted to talk to patient- inability to pay for her medication; Patient has private insurance- [facility] with prescription coverage and works full time; CM will talk to patient again when she is feeling better. Abelino Derrick RN,BSN,MHA

## 2012-03-24 NOTE — Progress Notes (Signed)
Inpatient Diabetes Program Recommendations  AACE/ADA: New Consensus Statement on Inpatient Glycemic Control (2013)  Target Ranges:  Prepandial:   less than 140 mg/dL      Peak postprandial:   less than 180 mg/dL (1-2 hours)      Critically ill patients:  140 - 180 mg/dL   Reason for Visit: Consult.  Admitted in DKA  Note: Introduced self.  Patient states that she is not able to talk right now.  Will re-attempt tomorrow.  Note that another B-met is ordered for 4:30PM.  Do not see order for Hgb A1c.  Last known value is 11.4, drawn October 2011.  Request MD order for updated Hgb A1C.  Thank you. Eve Rey S. Elsie Lincoln, RN, CNS, CDE  [phone])

## 2012-03-25 ENCOUNTER — Encounter (HOSPITAL_COMMUNITY): Payer: Self-pay | Admitting: Urology

## 2012-03-25 LAB — BASIC METABOLIC PANEL
BUN: 7 mg/dL (ref 6–23)
BUN: 5 mg/dL — ABNORMAL LOW (ref 6–23)
CO2: 18 mEq/L — ABNORMAL LOW (ref 19–32)
Calcium: 8.8 mg/dL (ref 8.4–10.5)
Chloride: 107 mEq/L (ref 96–112)
Chloride: 107 mEq/L (ref 96–112)
Creatinine, Ser: 0.81 mg/dL (ref 0.50–1.10)
GFR calc Af Amer: >90 mL/min (ref >90)
GFR calc Af Amer: >90 mL/min (ref >90)
GFR calc non Af Amer: 87 mL/min — ABNORMAL LOW (ref >90)
GFR calc non Af Amer: >90 mL/min (ref >90)
Glucose, Bld: 216 mg/dL — ABNORMAL HIGH (ref 70–99)
Glucose, Bld: 154 mg/dL — ABNORMAL HIGH (ref 70–99)
Potassium: 3.5 mEq/L (ref 3.5–5.1)
Potassium: 3.9 mEq/L (ref 3.5–5.1)
Sodium: 136 mEq/L (ref 135–145)
Sodium: 138 mEq/L (ref 135–145)

## 2012-03-25 LAB — CBC WITH DIFFERENTIAL/PLATELET
Basophils Absolute: 0 10*3/uL (ref 0.0–0.1)
Basophils Relative: 0 % (ref 0–1)
Eosinophils Absolute: 0 10*3/uL (ref 0.0–0.7)
Eosinophils Relative: 0 % (ref 0–5)
HCT: 30 % — ABNORMAL LOW (ref 36.0–46.0)
Hemoglobin: 9.5 g/dL — ABNORMAL LOW (ref 12.0–15.0)
Lymphocytes Relative: 13 % (ref 12–46)
Lymphs Abs: 1.7 10*3/uL (ref 0.7–4.0)
MCH: 23.2 pg — ABNORMAL LOW (ref 26.0–34.0)
MCHC: 31.7 g/dL (ref 30.0–36.0)
MCV: 73.3 fL — ABNORMAL LOW (ref 78.0–100.0)
Monocytes Absolute: 1 10*3/uL (ref 0.1–1.0)
Monocytes Relative: 8 % (ref 3–12)
Neutro Abs: 10 10*3/uL — ABNORMAL HIGH (ref 1.7–7.7)
Neutrophils Relative %: 79 % — ABNORMAL HIGH (ref 43–77)
Platelets: 379 10*3/uL (ref 150–400)
RBC: 4.09 MIL/uL (ref 3.87–5.11)
RDW: 15.4 % (ref 11.5–15.5)
WBC: 12.8 10*3/uL — ABNORMAL HIGH (ref 4.0–10.5)

## 2012-03-25 LAB — GLUCOSE, CAPILLARY
Glucose-Capillary: 152 mg/dL — ABNORMAL HIGH (ref 70–99)
Glucose-Capillary: 145 mg/dL — ABNORMAL HIGH (ref 70–99)
Glucose-Capillary: 147 mg/dL — ABNORMAL HIGH (ref 70–99)

## 2012-03-25 MED ORDER — METOCLOPRAMIDE HCL 5 MG PO TABS
5.0000 mg | ORAL_TABLET | Freq: Three times a day (TID) | ORAL | Status: DC
  Administered 2012-03-25 – 2012-03-28 (×13): 5 mg via ORAL
  Filled 2012-03-25 (×16): qty 1

## 2012-03-25 MED ORDER — INSULIN ASPART PROT & ASPART (70-30 MIX) 100 UNIT/ML ~~LOC~~ SUSP
12.0000 [IU] | Freq: Two times a day (BID) | SUBCUTANEOUS | Status: DC
  Administered 2012-03-25 – 2012-03-28 (×6): 12 [IU] via SUBCUTANEOUS
  Filled 2012-03-25: qty 3

## 2012-03-25 MED ORDER — FERROUS FUMARATE 325 (106 FE) MG PO TABS
1.0000 | ORAL_TABLET | Freq: Two times a day (BID) | ORAL | Status: DC
  Administered 2012-03-25 – 2012-03-28 (×7): 106 mg via ORAL
  Filled 2012-03-25 (×8): qty 1

## 2012-03-25 MED ORDER — SODIUM CHLORIDE 0.9 % IV SOLN
INTRAVENOUS | Status: AC
  Administered 2012-03-25: 10:00:00 via INTRAVENOUS

## 2012-03-25 MED ORDER — INSULIN GLARGINE 100 UNIT/ML ~~LOC~~ SOLN
15.0000 [IU] | Freq: Every day | SUBCUTANEOUS | Status: DC
  Administered 2012-03-25: 15 [IU] via SUBCUTANEOUS

## 2012-03-25 MED ORDER — MORPHINE SULFATE 4 MG/ML IJ SOLN
3.0000 mg | INTRAMUSCULAR | Status: DC | PRN
  Administered 2012-03-25 – 2012-03-27 (×5): 3 mg via INTRAVENOUS
  Filled 2012-03-25 (×5): qty 1

## 2012-03-25 MED ORDER — INSULIN ASPART 100 UNIT/ML ~~LOC~~ SOLN
0.0000 [IU] | Freq: Three times a day (TID) | SUBCUTANEOUS | Status: DC
  Administered 2012-03-25: 12:00:00 via SUBCUTANEOUS
  Administered 2012-03-25: 3 [IU] via SUBCUTANEOUS
  Administered 2012-03-26: 11 [IU] via SUBCUTANEOUS
  Administered 2012-03-26 – 2012-03-27 (×3): 3 [IU] via SUBCUTANEOUS
  Administered 2012-03-28: 2 [IU] via SUBCUTANEOUS

## 2012-03-25 MED ORDER — INSULIN ASPART 100 UNIT/ML ~~LOC~~ SOLN: 3.0000 [IU] | Freq: Three times a day (TID) | SUBCUTANEOUS | Status: DC

## 2012-03-25 MED ORDER — INSULIN ASPART 100 UNIT/ML ~~LOC~~ SOLN
0.0000 [IU] | Freq: Three times a day (TID) | SUBCUTANEOUS | Status: DC
  Administered 2012-03-25: 9 [IU] via SUBCUTANEOUS

## 2012-03-25 MED ORDER — PANTOPRAZOLE SODIUM 40 MG PO TBEC
40.0000 mg | DELAYED_RELEASE_TABLET | Freq: Every day | ORAL | Status: DC
  Administered 2012-03-25 – 2012-03-28 (×4): 40 mg via ORAL
  Filled 2012-03-25 (×4): qty 1

## 2012-03-25 NOTE — Progress Notes (Signed)
[facility] PROGRESS NOTE  [name] AVW:[id number] DOB: Jul 06, 1965 DOA: 03/23/2012 PCP: Lonia Blood, MD  Assessment/Plan: 1. DKA--gap has now closed; bicarb is 18 (probably due to fasting ketoacidosis with NPO state); will change IVf to ns 100cc/hr for 10 more hours and will start 70/30 BID and SSI. Die to be advance to modify carb.  2. DM type 1, likely uncontrolled--A1C 12.9; will start tx with 70/30 BID and novolog with meal coverage. 3. Dehydration--pretty much resolved with IVF. 4. Abdominal pain, n/v--antiemetics, supportive care. Continue reglan; patient with hx of gastroparesis. 5. Microcytic anemia--likely secondary to menses. No signs of bleeding. Will start ferrous sulfate. Hgb 9.5 6. Non-compliance--plan as above. Close follow up with PCP   Code Status: Full Family Communication: no family at bedside Disposition Plan: home when medically stable   Brief narrative: 46 year old woman PMH DM type 1 presented to ED with abdominal pain, cramping starting today. CBG 529. Evaluation in ED revealed DKA and patient was referred for admission.  Consultants:  Diabetes coordinator  Antibiotics:  none  HPI/Subjective: Afebrile; feeling better, no nausea, hungry.    Objective: Filed Vitals:   03/24/12 0535 03/24/12 1407 03/24/12 2149 03/25/12 0430  BP: 147/76 168/64 148/56 121/56  Pulse: 114 111 110 96  Temp: 98.1 F (36.7 C) 98.5 F (36.9 C) 99 F (37.2 C) 98.4 F (36.9 C)  TempSrc: Oral Oral Oral Oral  Resp: 20 20 24 20   Height:      Weight:      SpO2: 100% 100% 100% 100%    Intake/Output Summary (Last 24 hours) at 03/25/12 1020 Last data filed at 03/25/12 1018  Gross per 24 hour  Intake 3408.33 ml  Output    800 ml  Net 2608.33 ml   Filed Weights   03/24/12 0119  Weight: 59.24 kg (130 lb 9.6 oz)    Exam:   General:  Feeling better; no nausea; hungry and afebrile.  Cardiovascular: mild tachycardic, no rubs or gallops  Respiratory: CTA  bilaterally  Abdomen: soft, ND; mild mid abdominal pain, no guarding  Neuro: non focal deficit  Data Reviewed: Basic Metabolic Panel:  Lab 03/25/12 8295 03/24/12 1622 03/24/12 1026 03/24/12 0630 03/24/12 0511  NA 136 140 140 140 139  K 3.5 4.0 3.8 4.1 4.1  CL 107 108 110 108 107  CO2 18* 16* 17* 16* 12*  GLUCOSE 216* 151* 141* 201* 238*  BUN 7 10 12 12 12   CREATININE 0.81 0.75 0.79 0.83 0.80  CALCIUM 8.8 9.4 9.5 9.2 9.5  MG -- -- -- -- --  PHOS -- -- -- -- --   Liver Function Tests:  Lab 03/23/12 2145  AST 16  ALT 10  ALKPHOS 103  BILITOT 0.6  PROT 7.5  ALBUMIN 3.4*    Lab 03/23/12 2145  LIPASE 16  AMYLASE --   CBC:  Lab 03/25/12 0545 03/23/12 2145  WBC 12.8* 14.1*  NEUTROABS 10.0* --  HGB 9.5* 9.8*  HCT 30.0* 31.9*  MCV 73.3* 74.5*  PLT 379 397   Cardiac Enzymes:  Lab 03/24/12 0212  CKTOTAL --  CKMB --  CKMBINDEX --  TROPONINI <0.30   CBG:  Lab 03/25/12 1010 03/25/12 0742 03/25/12 0428 03/25/12 0322 03/25/12 0218  GLUCAP 266* 352* 147* 135* 145*     Studies: Dg Abd Acute W/chest  03/23/2012  *RADIOLOGY REPORT*  Clinical Data: Abdominal pain  ACUTE ABDOMEN SERIES (ABDOMEN 2 VIEW & CHEST 1 VIEW)  Comparison: 03/29/2010  Findings: The lungs are clear without  focal infiltrate, edema, pneumothorax or pleural effusion. The cardiopericardial silhouette is within normal limits for size. Imaged bony structures of the thorax are intact. Telemetry leads overlie the chest.  Upright film shows no evidence for intraperitoneal free air.  There is some gas visible in the stomach, but otherwise a paucity of bowel gas is observed. Surgical clips in the right upper quadrant suggest prior cholecystectomy.  Hazy soft tissue attenuation over the central pelvis may be related to a distended bladder. Visualized bony structures are unremarkable.  IMPRESSION: No acute cardiopulmonary process.  No intraperitoneal free air.  No gaseous bowel dilatation to suggest obstruction.  A  paucity of bowel gas may be related to fluid-filled bowel loops.   Original Report Authenticated By: ERIC A. MANSELL, M.D.     Scheduled Meds:    . influenza  inactive virus vaccine  0.5 mL Intramuscular Tomorrow-1000  . insulin aspart  0-15 Units Subcutaneous TID WC  . insulin aspart  3 Units Subcutaneous TID WC  . insulin aspart protamine-insulin aspart  12 Units Subcutaneous BID WC  . metoCLOPramide  5 mg Oral TID AC & HS  . pantoprazole  40 mg Oral Daily  . DISCONTD: insulin aspart  0-9 Units Subcutaneous TID WC  . DISCONTD: insulin glargine  15 Units Subcutaneous QHS  . DISCONTD: metoCLOPramide (REGLAN) injection  5 mg Intravenous Q6H   Continuous Infusions:    . sodium chloride 100 mL/hr at 03/25/12 1018  . DISCONTD: dextrose 5 % and 0.45% NaCl Stopped (03/25/12 1018)  . DISCONTD: insulin (NOVOLIN-R) infusion Stopped (03/25/12 0433)    Time spent: > 30 minutes    [name]  [facility] Pager [phone]. If 8PM-8AM, please contact night-coverage at [email or web address], password [facility] 03/25/2012, 10:20 AM  LOS: 2 days

## 2012-03-25 NOTE — Progress Notes (Signed)
Educated patient on self administration of insulin. Patient verbalized understanding and provided self demonstration without difficulty.

## 2012-03-25 NOTE — Progress Notes (Signed)
Inpatient Diabetes Program Recommendations  AACE/ADA: New Consensus Statement on Inpatient Glycemic Control (2013)  Target Ranges:  Prepandial:   less than 140 mg/dL      Peak postprandial:   less than 180 mg/dL (1-2 hours)      Critically ill patients:  140 - 180 mg/dL   Reason for Visit: Follow-up from attempted assessment yesterday.  MD Consult  Inpatient Diabetes Program Recommendations HgbA1C: 12.9  Outpatient Referral: States it is difficult for her to go with work schedule.  Diet: Starting a CHO Modified Medium diet.  Reports poor appetite.  Note: Patient more receptive to my visit today.  Still has poor appetite.  Soft-spoken-- acts as if she is still feeling somewhat poorly, but better than yesterday.  Explained to me the cost involved in getting her insulin through her insurance.  Lantus pens costs her $125 for 3 month supply and Novolog $100 for 3 month supply.  Gave her information regarding Novolin/ReliOn 70/30 insulin available at [facility] without a prescription should she be unable to get insulin through her insurance in the future.  MD has ordered 70/30 to start this evening at supper.  Also gave her information regarding a less expensive monitoring system available at [facility]-- ReliOn Prime meter.  (Meter costs $16.24 and strips cost $9 for 50.)    Discussed with patient that when using 70/30 insulin, she must eat 3 meals/day-- not skip meals, and have at least an HS snack.  (May also need a small mid-morning or mid-afternoon snack.)  Told her that it may take some time to get her dosages adjusted and she will need to stay in close contact with her PCP after discharge to let him know if her control is not on target.  Spoke with her in terms of being on 70/30 twice a day- and no other insulin being used regularly.  MD has her on low-dose meal coverage for now with a conservative dose of 70/30.  Discharge insulin depends upon how her CBG's respond.  Patient is not familiar with the  concept of CHO counting.  Sounds as though she has an eating routine.  Dietitian has been consulted to discuss CHO counting with her.  Suggested she watch the video regarding CHO counting as well so she can better understand concept and determine if it an approach she wants to try.

## 2012-03-25 NOTE — Progress Notes (Signed)
Brief Nutrition Note  Consult received for Education.  Pt with a hx of DM type 1 and non-compliance admitted with DKA.  Pt did not have enough insulin and was stretching it out per chart.  CHO MOD diet- not yet tolerating it due to pain.  Pt with history of gastroparesis.  Ht:  5'2" Wt:  130 1/2#    BMI:  23.9  Results for [name], [name] (MRN [id number]) as of 03/25/2012 14:15  Ref. Range 05/31/2009 15:29 06/01/2009 04:45 10/22/2009 04:00 03/29/2010 15:30 03/24/2012 16:23  Hemoglobin A1C Latest Range: <5.7 % 10.5.Marland KitchenMarland Kitchen (H) 9.3... (H) 8.4... (H) 11.4... (H) 12.9 (H)    Currently pt experiencing pain.  Education not appropriate at this time.  Pt reports that she does not count CHO.  Diet information on CHO counting left in room with RD name and number.  Will return tomorrow to educate pt.  Oran Rein, RD

## 2012-03-26 DIAGNOSIS — J309 Allergic rhinitis, unspecified: Secondary | ICD-10-CM

## 2012-03-26 LAB — GLUCOSE, CAPILLARY
Glucose-Capillary: 159 mg/dL — ABNORMAL HIGH (ref 70–99)
Glucose-Capillary: 111 mg/dL — ABNORMAL HIGH (ref 70–99)
Glucose-Capillary: 51 mg/dL — ABNORMAL LOW (ref 70–99)
Glucose-Capillary: 122 mg/dL — ABNORMAL HIGH (ref 70–99)

## 2012-03-26 LAB — BASIC METABOLIC PANEL
Calcium: 8.9 mg/dL (ref 8.4–10.5)
GFR calc non Af Amer: >90 mL/min (ref >90)
Glucose, Bld: 273 mg/dL — ABNORMAL HIGH (ref 70–99)
Potassium: 3.7 mEq/L (ref 3.5–5.1)
Sodium: 135 mEq/L (ref 135–145)

## 2012-03-26 MED ORDER — SODIUM CHLORIDE 0.9 % IV SOLN
INTRAVENOUS | Status: AC
  Administered 2012-03-27: 01:00:00 via INTRAVENOUS

## 2012-03-26 MED ORDER — GLUCOSE-VITAMIN C 4-6 GM-MG PO CHEW
CHEWABLE_TABLET | ORAL | Status: AC
  Filled 2012-03-26: qty 3

## 2012-03-26 NOTE — Progress Notes (Signed)
Patient ID: [name], female   DOB: [birth year], 46 y.o.   MRN: [id number]  [facility] PROGRESS NOTE  Galilee Schnetzer EPP:[id number] DOB: 1965-11-15 DOA: 03/23/2012 PCP: Lonia Blood, MD  Brief narrative: Pt is 46 yo female who presented to ED with abdominal pain, N/V. Found to be in DKA.  Principal Problem:  *DKA (diabetic ketoacidoses) - Bicarbonate is still slightly low with slight anion gap elevation this morning - Will initiate IV fluids over 12 hour period and reassess with BMP - We'll continue insulin regimen with insulin 70/30 and novolog - Appreciate assistance of diabetic coordinator  Active Problems:  DIABETES MELLITUS, TYPE I - A1c greater than 12 - This is secondary to medical noncompliance secondary to patient's inability to afford the medicine - Insulin 70/30 is cheaper option, will ask case management to address   Dehydration - Improved however oral intake is still poor - Will start IV fluids today and reassess in the morning - Encourage by mouth intake   Abdominal pain - Improved and well controlled on current pain medication regimen   Microcytic anemia - Hemoglobin and hematocrit stable and at patient's baseline - CBC in the morning   Non compliance w medication regimen - This was discussed in detail  Consultants:  Diabetic coordinator   Procedures/Studies:  None  Antibiotics:  None  Code Status: Full Family Communication: Pt at bedside Disposition Plan: Home in 1-2 days  HPI/Subjective: No events overnight.   Objective: Filed Vitals:   03/26/12 0208 03/26/12 0610 03/26/12 1153 03/26/12 1426  BP: 146/64 159/74 93/47 121/56  Pulse: 114 110 103 101  Temp: 98.8 F (37.1 C) 98.7 F (37.1 C) 98.5 F (36.9 C) 97.8 F (36.6 C)  TempSrc: Oral Oral Oral Oral  Resp: 20 20 20 20   Height:      Weight:      SpO2: 100% 100% 96% 100%    Intake/Output Summary (Last 24 hours) at 03/26/12 1432 Last data filed at 03/26/12 1426  Gross  per 24 hour  Intake 943.33 ml  Output    750 ml  Net 193.33 ml    Exam:   General:  Pt is alert, follows commands appropriately, not in acute distress  Cardiovascular: Regular rhythm, tachycardic, S1/S2, no murmurs, no rubs, no gallops  Respiratory: Clear to auscultation bilaterally, no wheezing, no crackles, no rhonchi  Abdomen: Soft, non tender, non distended, bowel sounds present, no guarding  Extremities: No edema, pulses DP and PT palpable bilaterally  Neuro: Grossly nonfocal  Data Reviewed: Basic Metabolic Panel:  Lab 03/26/12 6063 03/25/12 1517 03/25/12 0545 03/24/12 1622 03/24/12 1026  NA 135 138 136 140 140  K 3.7 3.9 3.5 4.0 3.8  CL 103 107 107 108 110  CO2 16* 15* 18* 16* 17*  GLUCOSE 273* 154* 216* 151* 141*  BUN 4* 5* 7 10 12   CREATININE 0.72 0.78 0.81 0.75 0.79  CALCIUM 8.9 9.0 8.8 9.4 9.5  MG -- -- -- -- --  PHOS -- -- -- -- --   Liver Function Tests:  Lab 03/23/12 2145  AST 16  ALT 10  ALKPHOS 103  BILITOT 0.6  PROT 7.5  ALBUMIN 3.4*    Lab 03/23/12 2145  LIPASE 16  AMYLASE --   No results found for this basename: AMMONIA:5 in the last 168 hours CBC:  Lab 03/25/12 0545 03/23/12 2145  WBC 12.8* 14.1*  NEUTROABS 10.0* --  HGB 9.5* 9.8*  HCT 30.0* 31.9*  MCV 73.3* 74.5*  PLT 379 397  Cardiac Enzymes:  Lab 03/24/12 0212  CKTOTAL --  CKMB --  CKMBINDEX --  TROPONINI <0.30   BNP: No components found with this basename: POCBNP:5 CBG:  Lab 03/26/12 1155 03/26/12 0756 03/25/12 2215 03/25/12 1730 03/25/12 1134  GLUCAP 159* 311* 104* 175* 205*    No results found for this or any previous visit (from the past 240 hour(s)).   Scheduled Meds:   . ferrous fumarate  1 tablet Oral BID  . insulin aspart  0-15 Units Subcutaneous TID WC  . insulin aspart  3 Units Subcutaneous TID WC  . insulin aspart protamine-insulin aspart  12 Units Subcutaneous BID WC  . metoCLOPramide  5 mg Oral TID AC & HS  . pantoprazole  40 mg Oral Daily    Continuous Infusions:   . sodium chloride 100 mL/hr at 03/25/12 1900     Debbora Presto, MD  [facility] Pager [phone]  If 7PM-7AM, please contact night-coverage [email or web address] Password [email or web address] 03/26/2012, 2:32 PM   LOS: 3 days

## 2012-03-26 NOTE — Progress Notes (Signed)
Brief Nutrition Note  Followed up for appropriateness of DM diet ed.  Pt on CHO MOD diet.  Still has not eaten.    Very flat affect.  Remains inappropriate for education.  Dose not answer questions well.  Per Diabetes Coordinator, pt seems to have a meal routine at home.  Changes to insulin are being mae so pt can afford it.  Must eat 3 meals a day, HS snack and possible an afternoon snack and not skip meals on 70/30.  Pt reports that she does use sweet tea at times.  When appropriate, will address basic DM guidelines and CHO counting only if pt shows improvement in ability to receive/understand information.  Will follow up with education as appropriate.  Oran Rein, RD [phone]

## 2012-03-26 NOTE — Progress Notes (Signed)
Inpatient Diabetes Program Recommendations  AACE/ADA: New Consensus Statement on Inpatient Glycemic Control (2013)  Target Ranges:  Prepandial:   less than 140 mg/dL      Peak postprandial:   less than 180 mg/dL (1-2 hours)      Critically ill patients:  140 - 180 mg/dL   Reason for Visit: Results for [name], [name] (MRN [id number]) as of 03/26/2012 14:44  Ref. Range 03/25/2012 17:30 03/25/2012 22:15 03/26/2012 05:39 03/26/2012 07:56 03/26/2012 11:55  Glucose-Capillary Latest Range: 70-99 mg/dL 956 (H) 213 (H)  086 (H) 159 (H)   Consider d/c of Novolog meal coverage since patient is on 70/30-which includes meal coverage. May need adjustment in 70/30 doses.

## 2012-03-26 NOTE — Progress Notes (Signed)
Patient has private insurance - [facility] with prescription coverage and works full time. Talked to patient about her inability to pay for her medication. Encouraged patient to talk to her PCP about the cost of the medication so that he can make adjustments as needed.

## 2012-03-27 LAB — BASIC METABOLIC PANEL
CO2: 21 mEq/L (ref 19–32)
Chloride: 103 mEq/L (ref 96–112)
Creatinine, Ser: 0.69 mg/dL (ref 0.50–1.10)
GFR calc Af Amer: >90 mL/min (ref >90)
Potassium: 3 mEq/L — ABNORMAL LOW (ref 3.5–5.1)

## 2012-03-27 LAB — CBC
MCV: 72.9 fL — ABNORMAL LOW (ref 78.0–100.0)
Platelets: 391 10*3/uL (ref 150–400)
RBC: 4.43 MIL/uL (ref 3.87–5.11)
RDW: 15.4 % (ref 11.5–15.5)
WBC: 5.4 10*3/uL (ref 4.0–10.5)

## 2012-03-27 LAB — GLUCOSE, CAPILLARY: Glucose-Capillary: 162 mg/dL — ABNORMAL HIGH (ref 70–99)

## 2012-03-27 MED ORDER — GLUCOSE-VITAMIN C 4-6 GM-MG PO CHEW: 4.0000 | CHEWABLE_TABLET | ORAL | Status: DC | PRN

## 2012-03-27 MED ORDER — POTASSIUM CHLORIDE CRYS ER 20 MEQ PO TBCR
40.0000 meq | EXTENDED_RELEASE_TABLET | ORAL | Status: AC
  Administered 2012-03-27 (×2): 40 meq via ORAL
  Filled 2012-03-27 (×2): qty 2

## 2012-03-27 NOTE — Progress Notes (Signed)
Nutrition Follow-up Education  Pt sleepy but clearer today and asking appropriate questions.  Wants to know dosage and timing of new insulin regime.  CHO MOD diet.  Still not eating well.  Reviewed with pt the importance of a regular meal schedule with bedtime snack is.  Discussed importance of avoiding sweet tea and other sugar containing beverages, choosing a protein with each meal and snack and protein sources.  Written info on the [facility] MOD diet provided with RD name and number.  Encouraged compliance with insulin regime.  Oran Rein, RD [phone]

## 2012-03-27 NOTE — Progress Notes (Signed)
Patient ID: [name], female   DOB: May 13, 1966, 46 y.o.   MRN: [id number] [facility] PROGRESS NOTE  [name] [id number]:[id number] DOB: 12-24-1965 DOA: 03/23/2012 PCP: Lonia Blood, MD  Brief narrative:  Pt is 46 yo female who presented to ED with abdominal pain, N/V. Found to be in DKA.   Principal Problem:  *DKA (diabetic ketoacidoses)  - resolved - better control of CBG's - Anion gap closed - continue Insulin but d/c Novolog meal coverage per recommendations of diabetic coordinator  Active Problems:  DIABETES MELLITUS, TYPE I  - A1c greater than 12  - This is secondary to medical noncompliance secondary to patient's inability to afford the medicine  - Insulin 70/30 is cheaper option, will ask case management to address  - appreciate input of diabetic coordinator  Dehydration  - Improved however oral intake is still poor  - d/c IVF - Encourage by mouth intake   Abdominal pain  - Improved and well controlled on current pain medication regimen   Microcytic anemia  - Hemoglobin and hematocrit stable and at patient's baseline  - CBC in the morning   Non compliance w medication regimen  - This was discussed in detail   Consultants:  Diabetic coordinator  Procedures/Studies:  None Antibiotics:  None  Code Status: Full  Family Communication: Pt at bedside  Disposition Plan: Home in AM  HPI/Subjective: No events overnight.   Objective: Filed Vitals:   03/26/12 1905 03/26/12 2135 03/27/12 0223 03/27/12 0500  BP: 146/58 168/77 159/76 151/74  Pulse: 108 110 104 98  Temp: 98.8 F (37.1 C) 98.3 F (36.8 C) 99 F (37.2 C) 97.9 F (36.6 C)  TempSrc: Oral Oral Oral Oral  Resp: 20 20 20 19   Height:      Weight:      SpO2: 99% 100% 99% 98%    Intake/Output Summary (Last 24 hours) at 03/27/12 1054 Last data filed at 03/27/12 0700  Gross per 24 hour  Intake   1305 ml  Output   1600 ml  Net   -295 ml    Exam:   General:  Pt is alert, follows  commands appropriately, not in acute distress  Cardiovascular: Regular rhythm, tachycardic, S1/S2, no murmurs, no rubs, no gallops  Respiratory: Clear to auscultation bilaterally, no wheezing, no crackles, no rhonchi  Abdomen: Soft, non tender, non distended, bowel sounds present, no guarding  Extremities: No edema, pulses DP and PT palpable bilaterally  Neuro: Grossly nonfocal  Data Reviewed: Basic Metabolic Panel:  Lab 03/27/12 4742 03/26/12 0539 03/25/12 1517 03/25/12 0545 03/24/12 1622  NA 137 135 138 136 140  K 3.0* 3.7 3.9 3.5 4.0  CL 103 103 107 107 108  CO2 21 16* 15* 18* 16*  GLUCOSE 165* 273* 154* 216* 151*  BUN 3* 4* 5* 7 10  CREATININE 0.69 0.72 0.78 0.81 0.75  CALCIUM 8.8 8.9 9.0 8.8 9.4  MG -- -- -- -- --  PHOS -- -- -- -- --   Liver Function Tests:  Lab 03/23/12 2145  AST 16  ALT 10  ALKPHOS 103  BILITOT 0.6  PROT 7.5  ALBUMIN 3.4*    Lab 03/23/12 2145  LIPASE 16  AMYLASE --   No results found for this basename: AMMONIA:5 in the last 168 hours CBC:  Lab 03/27/12 0352 03/25/12 0545 03/23/12 2145  WBC 5.4 12.8* 14.1*  NEUTROABS -- 10.0* --  HGB 10.1* 9.5* 9.8*  HCT 32.3* 30.0* 31.9*  MCV 72.9* 73.3* 74.5*  PLT 391  379 397   Cardiac Enzymes:  Lab 03/24/12 0212  CKTOTAL --  CKMB --  CKMBINDEX --  TROPONINI <0.30   BNP: No components found with this basename: POCBNP:5 CBG:  Lab 03/27/12 0756 03/26/12 2248 03/26/12 2137 03/26/12 1632 03/26/12 1155  GLUCAP 194* 122* 51* 111* 159*    No results found for this or any previous visit (from the past 240 hour(s)).   Scheduled Meds:   . ferrous fumarate  1 tablet Oral BID  . glucose-Vitamin C      . insulin aspart  0-15 Units Subcutaneous TID WC  . insulin aspart  3 Units Subcutaneous TID WC  . insulin aspart protamine-insulin aspart  12 Units Subcutaneous BID WC  . metoCLOPramide  5 mg Oral TID AC & HS  . pantoprazole  40 mg Oral Daily  . potassium chloride  40 mEq Oral Q4H    Continuous Infusions:   . sodium chloride 100 mL/hr at 03/27/12 0120     Debbora Presto, MD  [facility] Pager [phone]  If 7PM-7AM, please contact night-coverage [email or web address] Password [email or web address] 03/27/2012, 10:54 AM   LOS: 4 days

## 2012-03-27 NOTE — Progress Notes (Signed)
Hypoglycemic Event  CBG: 51  Treatment: glucose tabs x3  Symptoms: none  Follow-up CBG: Time: 2248 CBG Result:122  Possible Reasons for Event: pt not taking in po's  Comments/MD notified:  pt would not eat glucose tabs after 20-30 min, so gave grape juice and then waited 15 min to check CBG    [name] B  Remember to initiate Hypoglycemia Order Set & complete

## 2012-03-27 NOTE — Progress Notes (Signed)
Patient had a short run of SVT (<49minute) with rate up in the 170s. Patient was asymptomatic sitting up on the edge of the bed eating her lunch.  MD paged.  Will continue to monitor.

## 2012-03-28 LAB — BASIC METABOLIC PANEL
BUN: 6 mg/dL (ref 6–23)
CO2: 24 mEq/L (ref 19–32)
Chloride: 106 mEq/L (ref 96–112)
GFR calc non Af Amer: 85 mL/min — ABNORMAL LOW (ref >90)
Glucose, Bld: 81 mg/dL (ref 70–99)
Potassium: 3.1 mEq/L — ABNORMAL LOW (ref 3.5–5.1)
Sodium: 137 mEq/L (ref 135–145)

## 2012-03-28 LAB — CBC
HCT: 30 % — ABNORMAL LOW (ref 36.0–46.0)
Hemoglobin: 9.5 g/dL — ABNORMAL LOW (ref 12.0–15.0)
MCH: 23.1 pg — ABNORMAL LOW (ref 26.0–34.0)
MCHC: 31.7 g/dL (ref 30.0–36.0)
MCV: 72.8 fL — ABNORMAL LOW (ref 78.0–100.0)
RBC: 4.12 MIL/uL (ref 3.87–5.11)

## 2012-03-28 LAB — GLUCOSE, CAPILLARY: Glucose-Capillary: 86 mg/dL (ref 70–99)

## 2012-03-28 MED ORDER — METOCLOPRAMIDE HCL 5 MG PO TABS: 5.0000 mg | ORAL_TABLET | Freq: Three times a day (TID) | ORAL | Status: DC

## 2012-03-28 MED ORDER — PROMETHAZINE HCL 25 MG PO TABS: 25.0000 mg | ORAL_TABLET | Freq: Four times a day (QID) | ORAL | Status: DC | PRN

## 2012-03-28 MED ORDER — POTASSIUM CHLORIDE ER 10 MEQ PO TBCR: 10.0000 meq | EXTENDED_RELEASE_TABLET | Freq: Two times a day (BID) | ORAL | Status: DC

## 2012-03-28 MED ORDER — INSULIN ASPART PROT & ASPART (70-30 MIX) 100 UNIT/ML ~~LOC~~ SUSP: 12.0000 [IU] | Freq: Two times a day (BID) | SUBCUTANEOUS | Status: DC

## 2012-03-28 MED ORDER — LORAZEPAM 1 MG PO TABS: 1.0000 mg | ORAL_TABLET | Freq: Three times a day (TID) | ORAL | Status: DC

## 2012-03-28 MED ORDER — PANTOPRAZOLE SODIUM 40 MG PO TBEC: 40.0000 mg | DELAYED_RELEASE_TABLET | Freq: Every day | ORAL | Status: DC

## 2012-03-28 NOTE — Progress Notes (Signed)
Pt left at this time with her "friend". Pt alert, oriented, and without c/o. Discharge instructions/pescriptions given/explained with pt verbalizing understanding. Followup appointments noted.

## 2012-03-28 NOTE — Discharge Summary (Signed)
Physician Discharge Summary  [name] UJW:[id number] DOB: 1966-04-29 DOA: 03/23/2012  PCP: Lonia Blood, MD  Admit date: 03/23/2012 Discharge date: 03/28/2012  Recommendations for Outpatient Follow-up:  1. Pt will need to follow up with PCP in 2-3 weeks post discharge 2. Please obtain BMP to evaluate electrolytes and kidney function 3. Please also check CBC to evaluate Hg and Hct levels 4. Please note that pt given dose of K-dur 40 meq prior to discharge 5. She was advised to maintain her appointment and need for BMP checked to ensure that potassium is stable and within normal limits  Discharge Diagnoses: DKA secondary to medical noncompliance  Principal Problem:  *DKA (diabetic ketoacidoses) Active Problems:  DIABETES MELLITUS, TYPE I  Dehydration  Abdominal pain  Microcytic anemia  Non compliance w medication regimen  Discharge Condition: Stable  Diet recommendation: Heart healthy diet discussed in details   Brief narrative:  Pt is 46 yo female who presented to ED with abdominal pain, N/V. Found to be in DKA.   Principal Problem:  *DKA (diabetic ketoacidoses)  - resolved  - better control of CBG's  - Anion gap closed  - continue Insulin but d/c Novolog meal coverage per recommendations of diabetic coordinator  Active Problems:  DIABETES MELLITUS, TYPE I  - A1c greater than 12  - This is secondary to medical noncompliance secondary to patient's inability to afford the medicine  - Insulin 70/30 is cheaper option, will ask case management to address  - appreciate input of diabetic coordinator  Hypokalemia - in the setting of diuresis - will give one dose of K-dur prior to discharge - will give prescription for K-dur as well Dehydration  - resolved - Encouraged by mouth intake  Abdominal pain  - Improved and well controlled on current pain medication regimen  Microcytic anemia  - Hemoglobin and hematocrit stable and at patient's baseline  - CBC in the morning    Non compliance w medication regimen  - This was discussed in detail  Consultants:  Diabetic coordinator  Procedures/Studies:  None Antibiotics:  None   Hospital Course:   Discharge Exam: Filed Vitals:   03/28/12 0450  BP: 94/46  Pulse: 88  Temp: 97.8 F (36.6 C)  Resp: 18   Filed Vitals:   03/27/12 1455 03/27/12 2140 03/28/12 0213 03/28/12 0450  BP: 121/56 105/57 114/58 94/46  Pulse: 93 103 86 88  Temp: 98.7 F (37.1 C) 98.3 F (36.8 C) 98 F (36.7 C) 97.8 F (36.6 C)  TempSrc: Oral Oral Oral Oral  Resp: 18 18 20 18   Height:      Weight:      SpO2: 100% 100% 100% 100%    General: Pt is alert, follows commands appropriately, not in acute distress Cardiovascular: Regular rate and rhythm, S1/S2 +, no murmurs, no rubs, no gallops Respiratory: Clear to auscultation bilaterally, no wheezing, no crackles, no rhonchi Abdominal: Soft, non tender, non distended, bowel sounds +, no guarding Extremities: no edema, no cyanosis, pulses palpable bilaterally DP and PT Neuro: Grossly nonfocal  Discharge Instructions  Discharge Orders    Future Orders Please Complete By Expires   Diet - low sodium heart healthy      Increase activity slowly          Medication List     As of 03/28/2012 11:12 AM    STOP taking these medications         insulin aspart 100 UNIT/ML injection   Commonly known as: novoLOG  insulin glargine 100 UNIT/ML injection   Commonly known as: LANTUS      naproxen sodium 220 MG tablet   Commonly known as: ANAPROX      TAKE these medications         furosemide 80 MG tablet   Commonly known as: LASIX   Take 80 mg by mouth daily as needed. Fluid retention      insulin aspart protamine-insulin aspart (70-30) 100 UNIT/ML injection   Commonly known as: NOVOLOG 70/30   Inject 12 Units into the skin 2 (two) times daily with a meal.      lisinopril 10 MG tablet   Commonly known as: PRINIVIL,ZESTRIL   Take 10 mg by mouth daily.       LORazepam 1 MG tablet   Commonly known as: ATIVAN   Take 1 tablet (1 mg total) by mouth every 8 (eight) hours.      metoCLOPramide 5 MG tablet   Commonly known as: REGLAN   Take 1 tablet (5 mg total) by mouth 4 (four) times daily -  before meals and at bedtime.      pantoprazole 40 MG tablet   Commonly known as: PROTONIX   Take 1 tablet (40 mg total) by mouth daily.      pregabalin 150 MG capsule   Commonly known as: LYRICA   Take 150 mg by mouth 2 (two) times daily.      promethazine 25 MG tablet   Commonly known as: PHENERGAN   Take 1 tablet (25 mg total) by mouth every 6 (six) hours as needed. nausea           Follow-up Information    Follow up with KERR,JEFFREY, MD. In 2 weeks.   Contact information:   [address] [facility] [place] [address] [phone]       Follow up with Lonia Blood, MD.   Contact information:   [address] [place] [address]           The results of significant diagnostics from this hospitalization (including imaging, microbiology, ancillary and laboratory) are listed below for reference.     Microbiology: No results found for this or any previous visit (from the past 240 hour(s)).   Labs: Basic Metabolic Panel:  Lab 03/28/12 2956 03/27/12 0352 03/26/12 0539 03/25/12 1517 03/25/12 0545  NA 137 137 135 138 136  K 3.1* 3.0* 3.7 3.9 3.5  CL 106 103 103 107 107  CO2 24 21 16* 15* 18*  GLUCOSE 81 165* 273* 154* 216*  BUN 6 3* 4* 5* 7  CREATININE 0.83 0.69 0.72 0.78 0.81  CALCIUM 8.9 8.8 8.9 9.0 8.8  MG -- -- -- -- --  PHOS -- -- -- -- --   Liver Function Tests:  Lab 03/23/12 2145  AST 16  ALT 10  ALKPHOS 103  BILITOT 0.6  PROT 7.5  ALBUMIN 3.4*    Lab 03/23/12 2145  LIPASE 16  AMYLASE --   No results found for this basename: AMMONIA:5 in the last 168 hours CBC:  Lab 03/28/12 0450 03/27/12 0352 03/25/12 0545 03/23/12 2145  WBC 5.0 5.4 12.8* 14.1*  NEUTROABS -- -- 10.0* --   HGB 9.5* 10.1* 9.5* 9.8*  HCT 30.0* 32.3* 30.0* 31.9*  MCV 72.8* 72.9* 73.3* 74.5*  PLT 365 391 379 397   Cardiac Enzymes:  Lab 03/24/12 0212  CKTOTAL --  CKMB --  CKMBINDEX --  TROPONINI <0.30   BNP: BNP (last 3  results) No results found for this basename: PROBNP:3 in the last 8760 hours CBG:  Lab 03/28/12 0752 03/27/12 2135 03/27/12 1702 03/27/12 1144 03/27/12 0756  GLUCAP 139* 155* 162* 99 194*     SIGNED: Time coordinating discharge: Over 30 minutes  Debbora Presto, MD  [facility] 03/28/2012, 11:12 AM Pager [phone]  If 7PM-7AM, please contact night-coverage [email or web address] Password [email or web address]

## 2012-04-02 ENCOUNTER — Emergency Department (HOSPITAL_BASED_OUTPATIENT_CLINIC_OR_DEPARTMENT_OTHER): Payer: 59

## 2012-04-02 ENCOUNTER — Encounter (HOSPITAL_BASED_OUTPATIENT_CLINIC_OR_DEPARTMENT_OTHER): Payer: Self-pay | Admitting: Emergency Medicine

## 2012-04-02 ENCOUNTER — Observation Stay (HOSPITAL_BASED_OUTPATIENT_CLINIC_OR_DEPARTMENT_OTHER)
Admission: EM | Admit: 2012-04-02 | Discharge: 2012-04-05 | Disposition: A | Discharge status: Home / Self Care | Payer: 59 | Attending: Internal Medicine | Admitting: Internal Medicine

## 2012-04-02 DIAGNOSIS — K3184 Gastroparesis: Secondary | ICD-10-CM | POA: Insufficient documentation

## 2012-04-02 DIAGNOSIS — Z9119 Patient's noncompliance with other medical treatment and regimen: Secondary | ICD-10-CM | POA: Insufficient documentation

## 2012-04-02 DIAGNOSIS — R0989 Other specified symptoms and signs involving the circulatory and respiratory systems: Secondary | ICD-10-CM | POA: Insufficient documentation

## 2012-04-02 DIAGNOSIS — E860 Dehydration: Secondary | ICD-10-CM | POA: Insufficient documentation

## 2012-04-02 DIAGNOSIS — R0600 Dyspnea, unspecified: Secondary | ICD-10-CM

## 2012-04-02 DIAGNOSIS — K859 Acute pancreatitis without necrosis or infection, unspecified: Secondary | ICD-10-CM

## 2012-04-02 DIAGNOSIS — R1115 Cyclical vomiting syndrome unrelated to migraine: Principal | ICD-10-CM | POA: Insufficient documentation

## 2012-04-02 DIAGNOSIS — D509 Iron deficiency anemia, unspecified: Secondary | ICD-10-CM | POA: Insufficient documentation

## 2012-04-02 DIAGNOSIS — R112 Nausea with vomiting, unspecified: Secondary | ICD-10-CM

## 2012-04-02 DIAGNOSIS — E876 Hypokalemia: Secondary | ICD-10-CM | POA: Insufficient documentation

## 2012-04-02 DIAGNOSIS — Z79899 Other long term (current) drug therapy: Secondary | ICD-10-CM | POA: Insufficient documentation

## 2012-04-02 DIAGNOSIS — F411 Generalized anxiety disorder: Secondary | ICD-10-CM | POA: Insufficient documentation

## 2012-04-02 DIAGNOSIS — E109 Type 1 diabetes mellitus without complications: Secondary | ICD-10-CM

## 2012-04-02 DIAGNOSIS — R0609 Other forms of dyspnea: Secondary | ICD-10-CM | POA: Insufficient documentation

## 2012-04-02 DIAGNOSIS — K219 Gastro-esophageal reflux disease without esophagitis: Secondary | ICD-10-CM | POA: Insufficient documentation

## 2012-04-02 DIAGNOSIS — I10 Essential (primary) hypertension: Secondary | ICD-10-CM

## 2012-04-02 DIAGNOSIS — R1110 Vomiting, unspecified: Secondary | ICD-10-CM

## 2012-04-02 DIAGNOSIS — E1049 Type 1 diabetes mellitus with other diabetic neurological complication: Secondary | ICD-10-CM | POA: Insufficient documentation

## 2012-04-02 DIAGNOSIS — Z91199 Patient's noncompliance with other medical treatment and regimen due to unspecified reason: Secondary | ICD-10-CM | POA: Insufficient documentation

## 2012-04-02 DIAGNOSIS — R109 Unspecified abdominal pain: Secondary | ICD-10-CM

## 2012-04-02 DIAGNOSIS — K5900 Constipation, unspecified: Secondary | ICD-10-CM | POA: Insufficient documentation

## 2012-04-02 HISTORY — DX: Gastro-esophageal reflux disease without esophagitis: K21.9

## 2012-04-02 LAB — COMPREHENSIVE METABOLIC PANEL
ALT: 10 U/L (ref 0–35)
AST: 21 U/L (ref 0–37)
Albumin: 3.5 g/dL (ref 3.5–5.2)
Alkaline Phosphatase: 85 U/L (ref 39–117)
BUN: 9 mg/dL (ref 6–23)
CO2: 26 mEq/L (ref 19–32)
Calcium: 9 mg/dL (ref 8.4–10.5)
Chloride: 98 mEq/L (ref 96–112)
Creatinine, Ser: 0.7 mg/dL (ref 0.50–1.10)
GFR calc Af Amer: >90 mL/min (ref >90)
GFR calc non Af Amer: >90 mL/min (ref >90)
Glucose, Bld: 169 mg/dL — ABNORMAL HIGH (ref 70–99)
Potassium: 3.4 mEq/L — ABNORMAL LOW (ref 3.5–5.1)
Sodium: 136 mEq/L (ref 135–145)
Total Bilirubin: 0.4 mg/dL (ref 0.3–1.2)
Total Protein: 7.3 g/dL (ref 6.0–8.3)

## 2012-04-02 LAB — CBC WITH DIFFERENTIAL/PLATELET
HCT: 30.9 % — ABNORMAL LOW (ref 36.0–46.0)
Hemoglobin: 9.6 g/dL — ABNORMAL LOW (ref 12.0–15.0)
Lymphs Abs: 0.8 10*3/uL (ref 0.7–4.0)
MCH: 23.1 pg — ABNORMAL LOW (ref 26.0–34.0)
Monocytes Absolute: 0.5 10*3/uL (ref 0.1–1.0)
Monocytes Relative: 9 % (ref 3–12)
Neutro Abs: 4.5 10*3/uL (ref 1.7–7.7)
Neutrophils Relative %: 77 % (ref 43–77)
RBC: 4.15 MIL/uL (ref 3.87–5.11)

## 2012-04-02 LAB — URINE MICROSCOPIC-ADD ON

## 2012-04-02 LAB — URINALYSIS, ROUTINE W REFLEX MICROSCOPIC
Bilirubin Urine: NEGATIVE
Glucose, UA: NEGATIVE mg/dL
Leukocytes, UA: NEGATIVE
Nitrite: NEGATIVE
Specific Gravity, Urine: 1.014 (ref 1.005–1.030)
pH: 6 (ref 5.0–8.0)

## 2012-04-02 LAB — LIPASE, BLOOD: Lipase: 165 U/L — ABNORMAL HIGH (ref 11–59)

## 2012-04-02 MED ORDER — ONDANSETRON HCL 4 MG/2ML IJ SOLN
4.0000 mg | Freq: Once | INTRAMUSCULAR | Status: AC
  Administered 2012-04-02: 4 mg via INTRAVENOUS
  Filled 2012-04-02: qty 2

## 2012-04-02 MED ORDER — METOCLOPRAMIDE HCL 5 MG/ML IJ SOLN
10.0000 mg | Freq: Once | INTRAMUSCULAR | Status: AC
  Administered 2012-04-02: 10 mg via INTRAVENOUS
  Filled 2012-04-02: qty 2

## 2012-04-02 MED ORDER — SODIUM CHLORIDE 0.9 % IV BOLUS (SEPSIS)
1000.0000 mL | Freq: Once | INTRAVENOUS | Status: AC
  Administered 2012-04-02: 1000 mL via INTRAVENOUS

## 2012-04-02 NOTE — ED Notes (Signed)
Diffuse abd pain and vomiting since this morning.  DC'd from [facility] Friday.  Saw pmd yesterday.  Sx started this morning.

## 2012-04-02 NOTE — ED Provider Notes (Signed)
History     CSN: [id number]  Arrival date & time 04/02/12  2040   First MD Initiated Contact with Patient 04/02/12 2118      Chief Complaint  Patient presents with  . Emesis  . Abdominal Pain     HPI Diffuse abd pain and vomiting since this morning. DC'd from [facility] Friday. Saw pmd yesterday. Sx started this morning. Recently admitted and released for same.  Dx'd with DKA.  No fever.  Denies chest pain.  No hematemesis.    Past Medical History  Diagnosis Date  . Diabetes mellitus     diagnosed age 46  . Hypertension   . Cataract     Past Surgical History  Procedure Date  . Retinal detachment surgery     Family History  Problem Relation Age of Onset  . Hypertension Mother     History  Substance Use Topics  . Smoking status: Never Smoker   . Smokeless tobacco: Not on file  . Alcohol Use: No    OB History    Grav Para Term Preterm Abortions TAB SAB Ect Mult Living                  Review of Systems  All other systems reviewed and are negative.    Allergies  Anesthetics, amide  Home Medications   Current Outpatient Rx  Name Route Sig Dispense Refill  . FUROSEMIDE 80 MG PO TABS Oral Take 80 mg by mouth daily as needed. Fluid retention    . INSULIN ASPART PROT & ASPART (70-30) 100 UNIT/ML Sidney SUSP Subcutaneous Inject 12 Units into the skin 2 (two) times daily with a meal. 10 mL 3  . LISINOPRIL 10 MG PO TABS Oral Take 10 mg by mouth daily.    Marland Kitchen LORAZEPAM 1 MG PO TABS Oral Take 1 tablet (1 mg total) by mouth every 8 (eight) hours. 30 tablet 0  . METOCLOPRAMIDE HCL 5 MG PO TABS Oral Take 1 tablet (5 mg total) by mouth 4 (four) times daily -  before meals and at bedtime. 90 tablet 3  . PANTOPRAZOLE SODIUM 40 MG PO TBEC Oral Take 1 tablet (40 mg total) by mouth daily. 30 tablet 3  . POTASSIUM CHLORIDE ER 10 MEQ PO TBCR Oral Take 1 tablet (10 mEq total) by mouth 2 (two) times daily. 30 tablet 1  . PREGABALIN 150 MG PO CAPS Oral Take 150 mg by mouth 2 (two) times  daily.    Marland Kitchen PROMETHAZINE HCL 25 MG PO TABS Oral Take 1 tablet (25 mg total) by mouth every 6 (six) hours as needed. nausea 65 tablet 0    BP 141/66  Pulse 102  Temp 98.3 F (36.8 C) (Oral)  Resp 16  Ht 5\' 2"  (1.575 m)  Wt 142 lb (64.411 kg)  BMI 25.97 kg/m2  SpO2 100%  LMP 03/26/2012  Physical Exam  Nursing note and vitals reviewed. Constitutional: She is oriented to person, place, and time. She appears well-developed. No distress.  HENT:  Head: Normocephalic and atraumatic.  Eyes: Pupils are equal, round, and reactive to light.  Neck: Normal range of motion.  Cardiovascular: Normal rate and intact distal pulses.   Pulmonary/Chest: No respiratory distress.  Abdominal: Soft. Normal appearance and bowel sounds are normal. She exhibits no distension and no mass. There is tenderness (mild). There is no rebound and no guarding.  Musculoskeletal: Normal range of motion.  Neurological: She is alert and oriented to person, place, and time. No cranial  nerve deficit.  Skin: Skin is warm and dry. No rash noted.  Psychiatric: She has a normal mood and affect. Her behavior is normal.    ED Course  Procedures (including critical care time)  Medications  sodium chloride 0.9 % bolus 1,000 mL (1000 mL Intravenous Given 04/02/12 2142)  ondansetron (ZOFRAN) injection 4 mg (4 mg Intravenous Given 04/02/12 2143)    Labs Reviewed  URINALYSIS, ROUTINE W REFLEX MICROSCOPIC - Abnormal; Notable for the following:    Ketones, ur 15 (*)     Protein, ur 30 (*)     All other components within normal limits  CBC WITH DIFFERENTIAL - Abnormal; Notable for the following:    Hemoglobin 9.6 (*)     HCT 30.9 (*)     MCV 74.5 (*)     MCH 23.1 (*)     RDW 16.0 (*)     Platelets 404 (*)     All other components within normal limits  COMPREHENSIVE METABOLIC PANEL - Abnormal; Notable for the following:    Potassium 3.4 (*)     Glucose, Bld 169 (*)     All other components within normal limits  URINE  MICROSCOPIC-ADD ON  TROPONIN I  LIPASE, BLOOD  HCG, SERUM, QUALITATIVE   Dg Abd Acute W/chest  04/02/2012  *RADIOLOGY REPORT*  Clinical Data: 46 year old female abdominal pain at the umbilicus. Nausea vomiting constipation.  ACUTE ABDOMEN SERIES (ABDOMEN 2 VIEW & CHEST 1 VIEW)  Comparison: 03/23/2012 and earlier.  Findings: Normal lung volumes. Normal cardiac size and mediastinal contours.  Visualized tracheal air column is within normal limits. No pneumothorax or pneumoperitoneum.  Lungs remain clear.  Right upper quadrant surgical clips. Nonobstructed bowel gas pattern.  Retained stool throughout the colon.  Other abdominal and pelvic visceral contours are within normal limits. No acute osseous abnormality identified.  IMPRESSION: 1. Nonobstructed bowel gas pattern, no free air.  Retained stool throughout colon. 2.  Negative chest.   Original Report Authenticated By: Harley Hallmark, M.D.      1. Vomiting       MDM  Will admit for hydration.         Nelia Shi, MD 04/02/12 2308

## 2012-04-02 NOTE — ED Notes (Signed)
Attempted to call report to [phone].  Nurse not available.  He will call back.

## 2012-04-03 ENCOUNTER — Encounter (HOSPITAL_COMMUNITY): Payer: Self-pay | Admitting: General Practice

## 2012-04-03 ENCOUNTER — Observation Stay (HOSPITAL_COMMUNITY): Payer: 59

## 2012-04-03 DIAGNOSIS — R0609 Other forms of dyspnea: Secondary | ICD-10-CM

## 2012-04-03 DIAGNOSIS — K859 Acute pancreatitis without necrosis or infection, unspecified: Secondary | ICD-10-CM

## 2012-04-03 DIAGNOSIS — R1110 Vomiting, unspecified: Secondary | ICD-10-CM

## 2012-04-03 DIAGNOSIS — R0989 Other specified symptoms and signs involving the circulatory and respiratory systems: Secondary | ICD-10-CM

## 2012-04-03 DIAGNOSIS — I10 Essential (primary) hypertension: Secondary | ICD-10-CM

## 2012-04-03 LAB — CBC
HCT: 27.4 % — ABNORMAL LOW (ref 36.0–46.0)
Hemoglobin: 8.6 g/dL — ABNORMAL LOW (ref 12.0–15.0)
MCH: 23.3 pg — ABNORMAL LOW (ref 26.0–34.0)
MCHC: 31.4 g/dL (ref 30.0–36.0)

## 2012-04-03 LAB — HEPATIC FUNCTION PANEL
ALT: 9 U/L (ref 0–35)
Alkaline Phosphatase: 73 U/L (ref 39–117)
Bilirubin, Direct: <0.1 mg/dL (ref 0.0–0.3)
Total Bilirubin: 0.5 mg/dL (ref 0.3–1.2)
Total Protein: 6.2 g/dL (ref 6.0–8.3)

## 2012-04-03 LAB — GLUCOSE, CAPILLARY
Glucose-Capillary: 154 mg/dL — ABNORMAL HIGH (ref 70–99)
Glucose-Capillary: 217 mg/dL — ABNORMAL HIGH (ref 70–99)

## 2012-04-03 LAB — IRON AND TIBC
Iron: 19 ug/dL — ABNORMAL LOW (ref 42–135)
Saturation Ratios: 5 % — ABNORMAL LOW (ref 20–55)
TIBC: 366 ug/dL (ref 250–470)

## 2012-04-03 LAB — BASIC METABOLIC PANEL
BUN: 8 mg/dL (ref 6–23)
GFR calc non Af Amer: >90 mL/min (ref >90)
Glucose, Bld: 107 mg/dL — ABNORMAL HIGH (ref 70–99)
Potassium: 3 mEq/L — ABNORMAL LOW (ref 3.5–5.1)

## 2012-04-03 LAB — VITAMIN B12: Vitamin B-12: 346 pg/mL (ref 211–911)

## 2012-04-03 LAB — MAGNESIUM: Magnesium: 1.6 mg/dL (ref 1.5–2.5)

## 2012-04-03 LAB — D-DIMER, QUANTITATIVE: D-Dimer, Quant: 0.64 ug/mL-FEU — ABNORMAL HIGH (ref 0.00–0.48)

## 2012-04-03 LAB — TROPONIN I: Troponin I: <0.3 ng/mL (ref <0.30)

## 2012-04-03 MED ORDER — SODIUM CHLORIDE 0.9 % IJ SOLN: 3.0000 mL | Freq: Two times a day (BID) | INTRAMUSCULAR | Status: DC

## 2012-04-03 MED ORDER — SENNOSIDES-DOCUSATE SODIUM 8.6-50 MG PO TABS
1.0000 | ORAL_TABLET | Freq: Every evening | ORAL | Status: DC | PRN
  Filled 2012-04-03: qty 1

## 2012-04-03 MED ORDER — PANTOPRAZOLE SODIUM 40 MG IV SOLR
40.0000 mg | Freq: Every day | INTRAVENOUS | Status: DC
  Administered 2012-04-03: 40 mg via INTRAVENOUS
  Filled 2012-04-03 (×2): qty 40

## 2012-04-03 MED ORDER — SODIUM CHLORIDE 0.9 % IJ SOLN: 3.0000 mL | INTRAMUSCULAR | Status: DC | PRN

## 2012-04-03 MED ORDER — LORAZEPAM 0.5 MG PO TABS
1.0000 mg | ORAL_TABLET | Freq: Three times a day (TID) | ORAL | Status: DC
  Administered 2012-04-03 – 2012-04-05 (×7): 1 mg via ORAL
  Filled 2012-04-03 (×3): qty 2
  Filled 2012-04-03: qty 1
  Filled 2012-04-03 (×2): qty 2
  Filled 2012-04-03: qty 1
  Filled 2012-04-03: qty 2

## 2012-04-03 MED ORDER — IOHEXOL 350 MG/ML SOLN
60.0000 mL | Freq: Once | INTRAVENOUS | Status: AC | PRN
  Administered 2012-04-03: 60 mL via INTRAVENOUS

## 2012-04-03 MED ORDER — MAGNESIUM SULFATE 40 MG/ML IJ SOLN
2.0000 g | Freq: Once | INTRAMUSCULAR | Status: AC
  Administered 2012-04-03: 2 g via INTRAVENOUS
  Filled 2012-04-03: qty 50

## 2012-04-03 MED ORDER — INSULIN ASPART 100 UNIT/ML ~~LOC~~ SOLN: 0.0000 [IU] | Freq: Every day | SUBCUTANEOUS | Status: DC

## 2012-04-03 MED ORDER — LISINOPRIL 10 MG PO TABS
10.0000 mg | ORAL_TABLET | Freq: Every day | ORAL | Status: DC
  Administered 2012-04-03 – 2012-04-05 (×3): 10 mg via ORAL
  Filled 2012-04-03 (×3): qty 1

## 2012-04-03 MED ORDER — INSULIN ASPART 100 UNIT/ML ~~LOC~~ SOLN: 0.0000 [IU] | Freq: Three times a day (TID) | SUBCUTANEOUS | Status: DC

## 2012-04-03 MED ORDER — HYDROCODONE-ACETAMINOPHEN 5-325 MG PO TABS: 1.0000 | ORAL_TABLET | ORAL | Status: DC | PRN

## 2012-04-03 MED ORDER — ACETAMINOPHEN 650 MG RE SUPP: 650.0000 mg | Freq: Four times a day (QID) | RECTAL | Status: DC | PRN

## 2012-04-03 MED ORDER — ZOLPIDEM TARTRATE 5 MG PO TABS: 5.0000 mg | ORAL_TABLET | Freq: Every evening | ORAL | Status: DC | PRN

## 2012-04-03 MED ORDER — ONDANSETRON HCL 4 MG/2ML IJ SOLN
4.0000 mg | Freq: Four times a day (QID) | INTRAMUSCULAR | Status: DC | PRN
  Administered 2012-04-03: 4 mg via INTRAVENOUS
  Filled 2012-04-03: qty 2

## 2012-04-03 MED ORDER — METOCLOPRAMIDE HCL 5 MG/ML IJ SOLN
10.0000 mg | Freq: Three times a day (TID) | INTRAMUSCULAR | Status: DC
  Administered 2012-04-03 – 2012-04-04 (×5): 10 mg via INTRAVENOUS
  Filled 2012-04-03 (×8): qty 2

## 2012-04-03 MED ORDER — ONDANSETRON HCL 4 MG PO TABS: 4.0000 mg | ORAL_TABLET | Freq: Four times a day (QID) | ORAL | Status: DC | PRN

## 2012-04-03 MED ORDER — ENOXAPARIN SODIUM 40 MG/0.4ML ~~LOC~~ SOLN
40.0000 mg | SUBCUTANEOUS | Status: DC
  Administered 2012-04-03 – 2012-04-05 (×3): 40 mg via SUBCUTANEOUS
  Filled 2012-04-03 (×3): qty 0.4

## 2012-04-03 MED ORDER — POTASSIUM CHLORIDE IN NACL 40-0.9 MEQ/L-% IV SOLN
INTRAVENOUS | Status: DC
  Administered 2012-04-03: 125 mL/h via INTRAVENOUS
  Administered 2012-04-03: 100 mL/h via INTRAVENOUS
  Administered 2012-04-03: 21:00:00 via INTRAVENOUS
  Administered 2012-04-03: 100 mL/h via INTRAVENOUS
  Filled 2012-04-03 (×3): qty 1000

## 2012-04-03 MED ORDER — SODIUM CHLORIDE 0.9 % IV SOLN
INTRAVENOUS | Status: AC
  Administered 2012-04-03: 02:00:00 via INTRAVENOUS

## 2012-04-03 MED ORDER — INSULIN ASPART PROT & ASPART (70-30 MIX) 100 UNIT/ML ~~LOC~~ SUSP
6.0000 [IU] | Freq: Two times a day (BID) | SUBCUTANEOUS | Status: DC
  Administered 2012-04-03 – 2012-04-04 (×2): 6 [IU] via SUBCUTANEOUS
  Filled 2012-04-03: qty 3

## 2012-04-03 MED ORDER — POTASSIUM CHLORIDE IN NACL 20-0.45 MEQ/L-% IV SOLN
INTRAVENOUS | Status: DC
  Administered 2012-04-03: 03:00:00 via INTRAVENOUS
  Filled 2012-04-03: qty 1000

## 2012-04-03 MED ORDER — BISACODYL 10 MG RE SUPP
10.0000 mg | Freq: Once | RECTAL | Status: DC
  Filled 2012-04-03: qty 1

## 2012-04-03 MED ORDER — ALUM & MAG HYDROXIDE-SIMETH 200-200-20 MG/5ML PO SUSP: 30.0000 mL | Freq: Four times a day (QID) | ORAL | Status: DC | PRN

## 2012-04-03 MED ORDER — ACETAMINOPHEN 325 MG PO TABS: 650.0000 mg | ORAL_TABLET | Freq: Four times a day (QID) | ORAL | Status: DC | PRN

## 2012-04-03 MED ORDER — SODIUM CHLORIDE 0.9 % IV SOLN: 250.0000 mL | INTRAVENOUS | Status: DC | PRN

## 2012-04-03 MED ORDER — POTASSIUM CHLORIDE CRYS ER 20 MEQ PO TBCR
40.0000 meq | EXTENDED_RELEASE_TABLET | ORAL | Status: AC
  Administered 2012-04-03 (×2): 40 meq via ORAL
  Filled 2012-04-03 (×2): qty 2

## 2012-04-03 MED ORDER — FLEET ENEMA 7-19 GM/118ML RE ENEM
1.0000 | ENEMA | Freq: Once | RECTAL | Status: DC
  Filled 2012-04-03: qty 1

## 2012-04-03 MED ORDER — INSULIN ASPART PROT & ASPART (70-30 MIX) 100 UNIT/ML ~~LOC~~ SUSP
12.0000 [IU] | Freq: Two times a day (BID) | SUBCUTANEOUS | Status: DC
  Administered 2012-04-03: 12 [IU] via SUBCUTANEOUS
  Filled 2012-04-03: qty 3

## 2012-04-03 NOTE — Progress Notes (Signed)
Utilization Review Completed.  

## 2012-04-03 NOTE — Progress Notes (Signed)
Pt refused Suppository; Explained to pt the rationale of medication. Pt states that she had a BM this AM and does not feel like she needs the suppository at this time. Reinforced to pt to please let staff know if she begins to feel constipated again. Benay Pike

## 2012-04-03 NOTE — Progress Notes (Signed)
Admitted pt to rm 4740, pt alert and oriented denied pain at this time, oriented to room, call bell placed within reach, admission assessment done. SR on heart monitor. Will continue to monitor. Filed Vitals:   04/03/12 0100  BP: 130/61  Pulse: 100  Temp: 99.1 F (37.3 C)  Resp: 18    Lealand Elting, [address]

## 2012-04-03 NOTE — Progress Notes (Signed)
  Echocardiogram 2D Echocardiogram has been performed.  [name] 04/03/2012, 12:00 PM

## 2012-04-03 NOTE — Progress Notes (Signed)
Pt stated they don't feel like they need the sup. Dulcoax, will have night shift ask PT again

## 2012-04-03 NOTE — H&P (Signed)
PCP:   Lonia Blood, MD   Chief Complaint:  Nausea and vomiting  HPI: This is a 46 y/o female with known h/o DM and gastroparesis presents today with c/o intractable nausea and vomiting today. No hematemeses. Patient also c/o abdominal pain , typical with here gastroparesis episodes. She reports no chest pain but has been having increasing SOB over the past two days. She does have some LE edema, she was on lasix 80mg  daily which was held by PCP 3 days ago. She was unable to kept food or meds down, so she came to the ER. History provided by patient  Review of Systems:  The patient denies anorexia, fever, weight loss,, vision loss, decreased hearing, hoarseness, chest pain, syncope, dyspnea on exertion, peripheral edema, balance deficits, hemoptysis, melena, hematochezia, severe indigestion/heartburn, hematuria, incontinence, genital sores, muscle weakness, suspicious skin lesions, transient blindness, difficulty walking, depression, unusual weight change, abnormal bleeding, enlarged lymph nodes, angioedema, and breast masses.  Past Medical History: Past Medical History  Diagnosis Date  . Diabetes mellitus     diagnosed age 69  . Hypertension   . Cataract   . Anxiety   . Anemia   . GERD (gastroesophageal reflux disease)    Past Surgical History  Procedure Date  . Retinal detachment surgery     Medications: Prior to Admission medications   Medication Sig Start Date End Date Taking? Authorizing Provider  furosemide (LASIX) 80 MG tablet Take 80 mg by mouth daily as needed. Fluid retention   Yes Historical Provider, MD  insulin aspart protamine-insulin aspart (NOVOLOG 70/30) (70-30) 100 UNIT/ML injection Inject 12 Units into the skin 2 (two) times daily with a meal. 03/28/12  Yes Dorothea Ogle, MD  lisinopril (PRINIVIL,ZESTRIL) 10 MG tablet Take 10 mg by mouth daily.   Yes Historical Provider, MD  LORazepam (ATIVAN) 1 MG tablet Take 1 tablet (1 mg total) by mouth every 8 (eight) hours.  03/28/12  Yes Dorothea Ogle, MD  metoCLOPramide (REGLAN) 5 MG tablet Take 1 tablet (5 mg total) by mouth 4 (four) times daily -  before meals and at bedtime. 03/28/12  Yes Dorothea Ogle, MD  pantoprazole (PROTONIX) 40 MG tablet Take 1 tablet (40 mg total) by mouth daily. 03/28/12  Yes Dorothea Ogle, MD  potassium chloride (K-DUR) 10 MEQ tablet Take 1 tablet (10 mEq total) by mouth 2 (two) times daily. 03/28/12  Yes Dorothea Ogle, MD  pregabalin (LYRICA) 150 MG capsule Take 150 mg by mouth 2 (two) times daily.   Yes Historical Provider, MD  promethazine (PHENERGAN) 25 MG tablet Take 1 tablet (25 mg total) by mouth every 6 (six) hours as needed. nausea 03/28/12  Yes Dorothea Ogle, MD    Allergies:   Allergies  Allergen Reactions  . Anesthetics, Amide Nausea And Vomiting    Social History:  reports that she has never smoked. She does not have any smokeless tobacco history on file. She reports that she does not drink alcohol or use illicit drugs.  Family History: Family History  Problem Relation Age of Onset  . Hypertension Mother     Physical Exam: Filed Vitals:   04/02/12 2043 04/02/12 2326 04/03/12 0100  BP: 141/66 128/47 130/61  Pulse: 102 100 100  Temp: 98.3 F (36.8 C) 98.7 F (37.1 C) 99.1 F (37.3 C)  TempSrc: Oral Oral Oral  Resp: 16 16 18   Height: 5\' 2"  (1.575 m)  5\' 2"  (1.575 m)  Weight: 64.411 kg (142 lb)  61.598  kg (135 lb 12.8 oz)  SpO2: 100% 98% 98%    General:  Alert and oriented times three, well developed and nourished, weak appearing female Eyes: PERRLA, pink conjunctiva, no scleral icterus ENT: dry oral mucosa, neck supple, no thyromegaly Lungs: clear to ascultation, no wheeze, no crackles, no use of accessory muscles Cardiovascular: regular rate and rhythm, no regurgitation, no gallops, no murmurs. No carotid bruits, no JVD Abdomen: soft, positive BS, non-tender, non-distended, no organomegaly, not an acute abdomen GU: not examined Neuro: CN II - XII grossly  intact, sensation intact Musculoskeletal: strength 5/5 all extremities, no clubbing, cyanosis or edema Skin: no rash, no subcutaneous crepitation, no decubitus Psych: appropriate patient   Labs on Admission:   [facility] 04/02/12 2111  NA 136  K 3.4*  CL 98  CO2 26  GLUCOSE 169*  BUN 9  CREATININE 0.70  CALCIUM 9.0  MG --  PHOS --    Basename 04/02/12 2111  AST 21  ALT 10  ALKPHOS 85  BILITOT 0.4  PROT 7.3  ALBUMIN 3.5    Basename 04/02/12 2306  LIPASE 165*  AMYLASE --    Basename 04/02/12 2111  WBC 5.9  NEUTROABS 4.5  HGB 9.6*  HCT 30.9*  MCV 74.5*  PLT 404*    Basename 04/02/12 2306  CKTOTAL --  CKMB --  CKMBINDEX --  TROPONINI <0.30   No components found with this basename: POCBNP:3 No results found for this basename: DDIMER:2 in the last 72 hours No results found for this basename: HGBA1C:2 in the last 72 hours No results found for this basename: CHOL:2,HDL:2,LDLCALC:2,TRIG:2,CHOLHDL:2,LDLDIRECT:2 in the last 72 hours No results found for this basename: TSH,T4TOTAL,FREET3,T3FREE,THYROIDAB in the last 72 hours No results found for this basename: VITAMINB12:2,FOLATE:2,FERRITIN:2,TIBC:2,IRON:2,RETICCTPCT:2 in the last 72 hours  Micro Results: No results found for this or any previous visit (from the past 240 hour(s)). Results for JAZLYNE, GAUGER (MRN [id number]) as of 04/03/2012 02:28  Ref. Range 04/02/2012 21:02  Color, Urine Latest Range: YELLOW  YELLOW  APPearance Latest Range: CLEAR  CLEAR  Specific Gravity, Urine Latest Range: 1.005-1.030  1.014  pH Latest Range: 5.0-8.0  6.0  Glucose Latest Range: NEGATIVE mg/dL NEGATIVE  Bilirubin Urine Latest Range: NEGATIVE  NEGATIVE  Ketones, ur Latest Range: NEGATIVE mg/dL 15 (A)  Protein Latest Range: NEGATIVE mg/dL 30 (A)  Urobilinogen, UA Latest Range: 0.0-1.0 mg/dL 1.0  Nitrite Latest Range: NEGATIVE  NEGATIVE  Leukocytes, UA Latest Range: NEGATIVE  NEGATIVE  Hgb urine dipstick Latest Range: NEGATIVE   NEGATIVE  Squamous Epithelial / LPF Latest Range: RARE  RARE  Bacteria, UA Latest Range: RARE  RARE    Radiological Exams on Admission: Dg Abd Acute W/chest  04/02/2012  *RADIOLOGY REPORT*  Clinical Data: 46 year old female abdominal pain at the umbilicus. Nausea vomiting constipation.  ACUTE ABDOMEN SERIES (ABDOMEN 2 VIEW & CHEST 1 VIEW)  Comparison: 03/23/2012 and earlier.  Findings: Normal lung volumes. Normal cardiac size and mediastinal contours.  Visualized tracheal air column is within normal limits. No pneumothorax or pneumoperitoneum.  Lungs remain clear.  Right upper quadrant surgical clips. Nonobstructed bowel gas pattern.  Retained stool throughout the colon.  Other abdominal and pelvic visceral contours are within normal limits. No acute osseous abnormality identified.  IMPRESSION: 1. Nonobstructed bowel gas pattern, no free air.  Retained stool throughout colon. 2.  Negative chest.   Original Report Authenticated By: Harley Hallmark, M.D.     Assessment/Plan Present on Admission:  .Nausea and vomiting Gastroparesis - admit to  tele Antiemetics ordered PRN, reglan and protonix changed to IV IV fluid hydration Sob and LE edema Unclear etio D dimer and 2D echo ordered Check cardiac enzymes Patient lasix 80mg  held EKG ordered .DIABETES MELLITUS, TYPE I HTn Anxiety  Stable, resume home meds, SSI   Full code DVT prophylaxis Jocelynn Gioffre 04/03/2012, 2:34 AM

## 2012-04-03 NOTE — Progress Notes (Signed)
Gave Pt 70/30 12u. Ask Pt if they wanted to take giving there N/v and abd pain they stated yes give the 12 u

## 2012-04-03 NOTE — Progress Notes (Signed)
[facility] PROGRESS NOTE  [name] ZOX:[id number] DOB: Nov 18, 1965 DOA: 04/02/2012 PCP: Lonia Blood, MD  Assessment/Plan:  #1 nausea/ vomiting /abdominal pain Questionable etiology. May be secondary to gastroparesis versus acute pancreatitis with elevated lipase level of 165 versus constipation. Acute abdominal series that showed retained stool. Patient with some clinical improvement. Will place patient on a bowel rest. IV fluids. Will give patient a Dulcolax suppository. Will check a fasting lipid panel. Patient's LFTs are within normal limits patient status post cholecystectomy and bowel if her gallstone pancreatitis. Continue IV Reglan. Continue IV Protonix.  #2 dyspnea on exertion Questionable etiology. Cardiac enzymes are negative x3. 2-D echo is pending. D-dimer was elevated at 0.64. We'll get a CT angiogram of the chest to rule out PE. Supportive care.  #3 probable acute pancreatitis Patient does have an elevated lipase level of 165. Patient denies any acute alcohol use. Will check an alcohol level. Will check a fasting lipid panel. Patient's LFTs are within normal limits and patient is status post cholecystectomy and a such a bout of gallstone pancreatitis. We'll place on bowel rest. IV fluids. Gentle hydration.  #4 type 2 diabetes with gastroparesis Check a hemoglobin A1c. Continue IV Reglan. We'll place on half home dose insulin. Sliding scale insulin.  #5 hypokalemia Likely secondary to GI losses secondary to problem #1. Replete.  #6 hypertension Continue lisinopril.  #7 constipation We'll give a Dulcolax suppository.  #8 anxiety Continue home regimen of Ativan.  #9 prophylaxis PPI for GI prophylaxis, Lovenox for DVT prophylaxis.  Code Status: Full Family Communication: Updated patient insisted in room Disposition Plan: Home when medically stable   Consultants:  None  Procedures:  2-D echo 04/03/2012  Acute abdominal series 04/02/2012  CT  angiography chest pain  Antibiotics:  None  HPI/Subjective: Patient states she's feeling better. Patient denies any further nausea or vomiting. Patient denies any abdominal pain. Patient states that she gets short of breath on exertion and complaining of some ankle edema.  Objective: Filed Vitals:   04/02/12 2326 04/03/12 0100 04/03/12 0452 04/03/12 0949  BP: 128/47 130/61 133/55 108/57  Pulse: 100 100 99 100  Temp: 98.7 F (37.1 C) 99.1 F (37.3 C) 98.7 F (37.1 C) 98.3 F (36.8 C)  TempSrc: Oral Oral Oral Oral  Resp: 16 18 18 18   Height:  5\' 2"  (1.575 m)    Weight:  61.598 kg (135 lb 12.8 oz) 60.9 kg (134 lb 4.2 oz)   SpO2: 98% 98% 96% 97%    Intake/Output Summary (Last 24 hours) at 04/03/12 1326 Last data filed at 04/03/12 0933  Gross per 24 hour  Intake 748.75 ml  Output    550 ml  Net 198.75 ml   Filed Weights   04/02/12 2043 04/03/12 0100 04/03/12 0452  Weight: 64.411 kg (142 lb) 61.598 kg (135 lb 12.8 oz) 60.9 kg (134 lb 4.2 oz)    Exam:   General:  NAD  Cardiovascular: RRR  Respiratory: CTAB  Abdomen: SOFT, MILD DISTENSION, nttp,+bs  Data Reviewed: Basic Metabolic Panel:  Lab 04/03/12 8119 04/03/12 0300 04/02/12 2111 03/28/12 0450  NA -- 137 136 137  K -- 3.0* 3.4* 3.1*  CL -- 103 98 106  CO2 -- 26 26 24   GLUCOSE -- 107* 169* 81  BUN -- 8 9 6   CREATININE -- 0.69 0.70 0.83  CALCIUM -- 8.5 9.0 8.9  MG 1.6 -- -- --  PHOS -- -- -- --   Liver Function Tests:  Lab 04/02/12 2111  AST  21  ALT 10  ALKPHOS 85  BILITOT 0.4  PROT 7.3  ALBUMIN 3.5    Lab 04/02/12 2306  LIPASE 165*  AMYLASE --   No results found for this basename: AMMONIA:5 in the last 168 hours CBC:  Lab 04/03/12 0300 04/02/12 2111 03/28/12 0450  WBC 5.9 5.9 5.0  NEUTROABS -- 4.5 --  HGB 8.6* 9.6* 9.5*  HCT 27.4* 30.9* 30.0*  MCV 74.3* 74.5* 72.8*  PLT 337 404* 365   Cardiac Enzymes:  Lab 04/03/12 0840 04/03/12 0300 04/02/12 2306  CKTOTAL -- -- --  CKMB -- -- --   CKMBINDEX -- -- --  TROPONINI <0.30 <0.30 <0.30   BNP (last 3 results) No results found for this basename: PROBNP:3 in the last 8760 hours CBG:  Lab 04/03/12 1125 04/03/12 0626 04/03/12 0449 04/02/12 2049 03/28/12 1134  GLUCAP 187* 108* 79 154* 86    No results found for this or any previous visit (from the past 240 hour(s)).   Studies: Dg Abd Acute W/chest  04/02/2012  *RADIOLOGY REPORT*  Clinical Data: 46 year old female abdominal pain at the umbilicus. Nausea vomiting constipation.  ACUTE ABDOMEN SERIES (ABDOMEN 2 VIEW & CHEST 1 VIEW)  Comparison: 03/23/2012 and earlier.  Findings: Normal lung volumes. Normal cardiac size and mediastinal contours.  Visualized tracheal air column is within normal limits. No pneumothorax or pneumoperitoneum.  Lungs remain clear.  Right upper quadrant surgical clips. Nonobstructed bowel gas pattern.  Retained stool throughout the colon.  Other abdominal and pelvic visceral contours are within normal limits. No acute osseous abnormality identified.  IMPRESSION: 1. Nonobstructed bowel gas pattern, no free air.  Retained stool throughout colon. 2.  Negative chest.   Original Report Authenticated By: Ulla Potash III, M.D.     Scheduled Meds:   . sodium chloride   Intravenous STAT  . bisacodyl  10 mg Rectal Once  . enoxaparin (LOVENOX) injection  40 mg Subcutaneous Q24H  . insulin aspart  0-9 Units Subcutaneous TID WC  . insulin aspart protamine-insulin aspart  6 Units Subcutaneous BID WC  . lisinopril  10 mg Oral Daily  . LORazepam  1 mg Oral Q8H  . magnesium sulfate 1 - 4 g bolus IVPB  2 g Intravenous Once  . metoCLOPramide (REGLAN) injection  10 mg Intravenous Q8H  . metoCLOPramide (REGLAN) injection  10 mg Intravenous Once  . ondansetron  4 mg Intravenous Once  . pantoprazole (PROTONIX) IV  40 mg Intravenous QHS  . potassium chloride  40 mEq Oral Q4H  . sodium chloride  1,000 mL Intravenous Once  . DISCONTD: insulin aspart  0-15 Units Subcutaneous  TID WC  . DISCONTD: insulin aspart  0-5 Units Subcutaneous QHS  . DISCONTD: insulin aspart protamine-insulin aspart  12 Units Subcutaneous BID WC  . DISCONTD: sodium chloride  3 mL Intravenous Q12H  . DISCONTD: sodium phosphate  1 enema Rectal Once   Continuous Infusions:   . 0.9 % NaCl with KCl 40 mEq / L 125 mL/hr (04/03/12 1210)  . DISCONTD: 0.45 % NaCl with KCl 20 mEq / L Stopped (04/03/12 1610)    Principal Problem:  *Nausea and vomiting Active Problems:  DIABETES MELLITUS, TYPE I  Dehydration  Abdominal pain  Microcytic anemia  Non compliance w medication regimen  Hypertension  Pancreatitis  Constipation  Dyspnea on exertion    Time spent: > 35 mins    [facility]  [facility] Pager [phone]. If 8PM-8AM, please contact night-coverage at [email or web address], password [facility] 04/03/2012, 1:26 PM  LOS: 1 day

## 2012-04-04 DIAGNOSIS — R112 Nausea with vomiting, unspecified: Secondary | ICD-10-CM

## 2012-04-04 LAB — COMPREHENSIVE METABOLIC PANEL
ALT: 9 U/L (ref 0–35)
Albumin: 2.9 g/dL — ABNORMAL LOW (ref 3.5–5.2)
Alkaline Phosphatase: 72 U/L (ref 39–117)
BUN: 4 mg/dL — ABNORMAL LOW (ref 6–23)
Chloride: 106 mEq/L (ref 96–112)
GFR calc Af Amer: >90 mL/min (ref >90)
Glucose, Bld: 86 mg/dL (ref 70–99)
Potassium: 5.3 mEq/L — ABNORMAL HIGH (ref 3.5–5.1)
Sodium: 137 mEq/L (ref 135–145)
Total Bilirubin: 0.5 mg/dL (ref 0.3–1.2)
Total Protein: 6.3 g/dL (ref 6.0–8.3)

## 2012-04-04 LAB — GLUCOSE, CAPILLARY
Glucose-Capillary: 108 mg/dL — ABNORMAL HIGH (ref 70–99)
Glucose-Capillary: 108 mg/dL — ABNORMAL HIGH (ref 70–99)
Glucose-Capillary: 236 mg/dL — ABNORMAL HIGH (ref 70–99)
Glucose-Capillary: 298 mg/dL — ABNORMAL HIGH (ref 70–99)
Glucose-Capillary: 305 mg/dL — ABNORMAL HIGH (ref 70–99)

## 2012-04-04 LAB — LIPID PANEL
HDL: 49 mg/dL (ref >39)
LDL Cholesterol: 116 mg/dL — ABNORMAL HIGH (ref 0–99)
Total CHOL/HDL Ratio: 3.8 RATIO

## 2012-04-04 LAB — CBC
HCT: 30.9 % — ABNORMAL LOW (ref 36.0–46.0)
MCHC: 30.7 g/dL (ref 30.0–36.0)
MCV: 74.8 fL — ABNORMAL LOW (ref 78.0–100.0)
Platelets: 356 10*3/uL (ref 150–400)
RDW: 16 % — ABNORMAL HIGH (ref 11.5–15.5)
WBC: 4.9 10*3/uL (ref 4.0–10.5)

## 2012-04-04 LAB — HEMOGLOBIN A1C: Hgb A1c MFr Bld: 12 % — ABNORMAL HIGH (ref <5.7)

## 2012-04-04 MED ORDER — SODIUM CHLORIDE 0.9 % IV SOLN
INTRAVENOUS | Status: DC
  Administered 2012-04-04: 100 mL/h via INTRAVENOUS

## 2012-04-04 MED ORDER — INSULIN ASPART 100 UNIT/ML ~~LOC~~ SOLN
0.0000 [IU] | SUBCUTANEOUS | Status: DC
  Administered 2012-04-04: 3 [IU] via SUBCUTANEOUS

## 2012-04-04 MED ORDER — INSULIN ASPART 100 UNIT/ML ~~LOC~~ SOLN
0.0000 [IU] | Freq: Three times a day (TID) | SUBCUTANEOUS | Status: DC
  Administered 2012-04-04: 5 [IU] via SUBCUTANEOUS
  Administered 2012-04-05: 3 [IU] via SUBCUTANEOUS

## 2012-04-04 MED ORDER — SODIUM CHLORIDE 0.9 % IV SOLN
INTRAVENOUS | Status: DC
  Administered 2012-04-04: 20:00:00 via INTRAVENOUS

## 2012-04-04 MED ORDER — INSULIN ASPART PROT & ASPART (70-30 MIX) 100 UNIT/ML ~~LOC~~ SUSP
12.0000 [IU] | Freq: Two times a day (BID) | SUBCUTANEOUS | Status: DC
  Administered 2012-04-04 – 2012-04-05 (×2): 12 [IU] via SUBCUTANEOUS
  Filled 2012-04-04: qty 3

## 2012-04-04 MED ORDER — METOCLOPRAMIDE HCL 5 MG PO TABS
5.0000 mg | ORAL_TABLET | Freq: Three times a day (TID) | ORAL | Status: DC
  Administered 2012-04-04 – 2012-04-05 (×3): 5 mg via ORAL
  Filled 2012-04-04 (×7): qty 1

## 2012-04-04 MED ORDER — PANTOPRAZOLE SODIUM 40 MG PO TBEC
40.0000 mg | DELAYED_RELEASE_TABLET | Freq: Every day | ORAL | Status: DC
  Administered 2012-04-05: 40 mg via ORAL
  Filled 2012-04-04: qty 1

## 2012-04-04 NOTE — Progress Notes (Signed)
[facility] PROGRESS NOTE  [name] ZOX:[id number] DOB: August 06, 1965 DOA: 04/02/2012 PCP: Lonia Blood, MD  Assessment/Plan:  #1 nausea/ vomiting /abdominal pain Questionable etiology. May be secondary to gastroparesis versus acute pancreatitis with elevated lipase level of 165 versus constipation. Acute abdominal series that showed retained stool. Patient with clinical improvement.  Lipase levels decreased of 50. Patient tolerating oral intake. Patient states has had a bowel movement. Fasting lipid panel with triglycerides within normal limits. Continue IV fluids. Change IV Reglan to oral Reglan. Continue PPI.  #2 dyspnea on exertion Questionable etiology. Cardiac enzymes are negative x3. 2-D echo . D-dimer was elevated at 0.64. CT angiogram of the chest negative for PE. Supportive care.  #3 probable acute pancreatitis Patient does have an elevated lipase level of 165 on admission this is improved and down to 50. Patient denies any acute alcohol use. Fasting lipid panel with triglycerides within normal limits.. Patient's LFTs are within normal limits and patient is status post cholecystectomy and a such a doubt of gallstone pancreatitis. Clinical improvement.  Advance diet to clear seen then as tolerated to a diabetic diet.   #4 type 2 diabetes with gastroparesis Check a hemoglobin A1c 11.7. Change reglan to oral. Resume home dose insulin. Sliding scale insulin.  #5 hypokalemia Likely secondary to GI losses secondary to problem #1. Repleted.  #6 hypertension Continue lisinopril.  #7 constipation Patient refused Dulcolax suppository. Patient stated had multiple bowel movements today.  #8 anxiety Continue home regimen of Ativan.  #9 prophylaxis PPI for GI prophylaxis, Lovenox for DVT prophylaxis.  Code Status: Full Family Communication: Updated patient  in room Disposition Plan: Home when medically stable   Consultants:  None  Procedures:  2-D echo  04/03/2012  Acute abdominal series 04/02/2012  CT angiography chest pain 04/03/12  2-D echo 04/03/2012  Antibiotics:  None  HPI/Subjective: Patient states feels much better. No nausea no vomiting no abdominal pain. Patient said it had a bowel movement today. No other complaints.  Objective: Filed Vitals:   04/04/12 0539 04/04/12 0956 04/04/12 1329 04/04/12 1405  BP: 132/72 110/65 96/49 102/54  Pulse: 93 92 93   Temp: 98 F (36.7 C)  98.4 F (36.9 C)   TempSrc: Oral  Oral   Resp: 18 18 18    Height:      Weight: 62.46 kg (137 lb 11.2 oz)     SpO2: 100% 100% 100%     Intake/Output Summary (Last 24 hours) at 04/04/12 1641 Last data filed at 04/04/12 1332  Gross per 24 hour  Intake   1784 ml  Output   1600 ml  Net    184 ml   Filed Weights   04/03/12 0100 04/03/12 0452 04/04/12 0539  Weight: 61.598 kg (135 lb 12.8 oz) 60.9 kg (134 lb 4.2 oz) 62.46 kg (137 lb 11.2 oz)    Exam:   General:  NAD  Cardiovascular: RRR  Respiratory: CTAB  Abdomen: SOFT, ND, nttp,+bs  Data Reviewed: Basic Metabolic Panel:  Lab 04/04/12 8119 04/03/12 0840 04/03/12 0300 04/02/12 2111  NA 137 -- 137 136  K 5.3* -- 3.0* 3.4*  CL 106 -- 103 98  CO2 23 -- 26 26  GLUCOSE 86 -- 107* 169*  BUN 4* -- 8 9  CREATININE 0.73 -- 0.69 0.70  CALCIUM 9.0 -- 8.5 9.0  MG -- 1.6 -- --  PHOS -- -- -- --   Liver Function Tests:  Lab 04/04/12 0550 04/03/12 0840 04/02/12 2111  AST 19 20 21   ALT  9 9 10   ALKPHOS 72 73 85  BILITOT 0.5 0.5 0.4  PROT 6.3 6.2 7.3  ALBUMIN 2.9* 2.9* 3.5    Lab 04/04/12 0550 04/02/12 2306  LIPASE 50 165*  AMYLASE -- --   No results found for this basename: AMMONIA:5 in the last 168 hours CBC:  Lab 04/04/12 0550 04/03/12 0300 04/02/12 2111  WBC 4.9 5.9 5.9  NEUTROABS -- -- 4.5  HGB 9.5* 8.6* 9.6*  HCT 30.9* 27.4* 30.9*  MCV 74.8* 74.3* 74.5*  PLT 356 337 404*   Cardiac Enzymes:  Lab 04/03/12 1401 04/03/12 0840 04/03/12 0300 04/02/12 2306  CKTOTAL --  -- -- --  CKMB -- -- -- --  CKMBINDEX -- -- -- --  TROPONINI <0.30 <0.30 <0.30 <0.30   BNP (last 3 results) No results found for this basename: PROBNP:3 in the last 8760 hours CBG:  Lab 04/04/12 1625 04/04/12 1136 04/04/12 0741 04/04/12 0604 04/03/12 2127  GLUCAP 298* 236* 108* 108* 73    No results found for this or any previous visit (from the past 240 hour(s)).   Studies: Ct Angio Chest Pe W/cm &/or Wo Cm  04/03/2012  *RADIOLOGY REPORT*  Clinical Data: Chest pain with shortness of breath on exertion and elevated D-dimer levels.  Question pulmonary embolism.  CT ANGIOGRAPHY CHEST  Technique:  Multidetector CT imaging of the chest using the standard protocol during bolus administration of intravenous contrast. Multiplanar reconstructed images including MIPs were obtained and reviewed to evaluate the vascular anatomy.  Contrast: 60mL OMNIPAQUE IOHEXOL 350 MG/ML SOLN  Comparison: Chest radiographs 03/29/2010 and 04/02/2012.  Abdominal CT 03/29/2010.  Findings: The pulmonary arteries are well opacified with contrast. There is no evidence of acute pulmonary embolism.  The thoracic aorta appears normal.  There are no enlarged mediastinal or hilar lymph nodes.  A small amount of residual thymic tissue is noted. There is a small hiatal hernia.  There is no pleural or pericardial effusion. The lungs are clear. There is minimal central airway thickening.  The visualized upper abdomen appears unremarkable.  IMPRESSION:  1.  No evidence of acute pulmonary embolism other acute chest process. 2.  Mild central airway thickening. 3.  Small hiatal hernia.   Original Report Authenticated By: Gerrianne Scale, M.D.    Dg Abd Acute W/chest  04/02/2012  *RADIOLOGY REPORT*  Clinical Data: 46 year old female abdominal pain at the umbilicus. Nausea vomiting constipation.  ACUTE ABDOMEN SERIES (ABDOMEN 2 VIEW & CHEST 1 VIEW)  Comparison: 03/23/2012 and earlier.  Findings: Normal lung volumes. Normal cardiac size and  mediastinal contours.  Visualized tracheal air column is within normal limits. No pneumothorax or pneumoperitoneum.  Lungs remain clear.  Right upper quadrant surgical clips. Nonobstructed bowel gas pattern.  Retained stool throughout the colon.  Other abdominal and pelvic visceral contours are within normal limits. No acute osseous abnormality identified.  IMPRESSION: 1. Nonobstructed bowel gas pattern, no free air.  Retained stool throughout colon. 2.  Negative chest.   Original Report Authenticated By: Harley Hallmark, M.D.     Scheduled Meds:    . bisacodyl  10 mg Rectal Once  . enoxaparin (LOVENOX) injection  40 mg Subcutaneous Q24H  . insulin aspart  0-9 Units Subcutaneous TID AC & HS  . insulin aspart protamine-insulin aspart  12 Units Subcutaneous BID WC  . lisinopril  10 mg Oral Daily  . LORazepam  1 mg Oral Q8H  . metoCLOPramide  5 mg Oral TID AC & HS  . pantoprazole  40 mg Oral Q0600  . DISCONTD: insulin aspart  0-9 Units Subcutaneous TID WC  . DISCONTD: insulin aspart  0-9 Units Subcutaneous Q4H  . DISCONTD: insulin aspart protamine-insulin aspart  6 Units Subcutaneous BID WC  . DISCONTD: metoCLOPramide (REGLAN) injection  10 mg Intravenous Q8H  . DISCONTD: pantoprazole (PROTONIX) IV  40 mg Intravenous QHS   Continuous Infusions:    . DISCONTD: sodium chloride 100 mL/hr (04/04/12 0801)  . DISCONTD: 0.9 % NaCl with KCl 40 mEq / L 100 mL/hr at 04/03/12 2121    Principal Problem:  *Nausea and vomiting Active Problems:  DIABETES MELLITUS, TYPE I  Dehydration  Abdominal pain  Microcytic anemia  Non compliance w medication regimen  Hypertension  Pancreatitis  Constipation  Dyspnea on exertion    Time spent: > 35 mins    [facility]  [facility] Pager [phone]. If 8PM-8AM, please contact night-coverage at [email or web address], password [facility] 04/04/2012, 4:41 PM  LOS: 2 days

## 2012-04-04 NOTE — Progress Notes (Signed)
Inpatient Diabetes Program Recommendations  AACE/ADA: New Consensus Statement on Inpatient Glycemic Control (2013)  Target Ranges:  Prepandial:   less than 140 mg/dL      Peak postprandial:   less than 180 mg/dL (1-2 hours)      Critically ill patients:  140 - 180 mg/dL    Inpatient Diabetes Program Recommendations Correction (SSI): change correction scale to TID   Note: Sensitive scale ordered Q 4 but patient is ordered diet and premix 70/30.  Recommend correction scale be given TID since patient is eating and on premix insulin.    Thank you  Piedad Climes [facility] Inpatient Diabetes Coordinator [phone]

## 2012-04-05 DIAGNOSIS — R109 Unspecified abdominal pain: Secondary | ICD-10-CM

## 2012-04-05 DIAGNOSIS — E109 Type 1 diabetes mellitus without complications: Secondary | ICD-10-CM

## 2012-04-05 LAB — BASIC METABOLIC PANEL
BUN: 5 mg/dL — ABNORMAL LOW (ref 6–23)
Calcium: 9.2 mg/dL (ref 8.4–10.5)
Chloride: 104 mEq/L (ref 96–112)
Creatinine, Ser: 0.87 mg/dL (ref 0.50–1.10)
GFR calc Af Amer: >90 mL/min (ref >90)
GFR calc non Af Amer: 80 mL/min — ABNORMAL LOW (ref >90)

## 2012-04-05 MED ORDER — METOCLOPRAMIDE HCL 5 MG PO TABS: 5.0000 mg | ORAL_TABLET | Freq: Three times a day (TID) | ORAL | Status: DC

## 2012-04-05 MED ORDER — INSULIN ASPART PROT & ASPART (70-30 MIX) 100 UNIT/ML ~~LOC~~ SUSP: 14.0000 [IU] | Freq: Two times a day (BID) | SUBCUTANEOUS | Status: DC

## 2012-04-05 NOTE — Discharge Summary (Signed)
Physician Discharge Summary  [name] WUJ:[id number] DOB: Mar 12, 1966 DOA: 04/02/2012  PCP: Lonia Blood, MD  Admit date: 04/02/2012 Discharge date: 04/05/2012  Recommendations for Outpatient Follow-up:  1. Patient is to followup with PCP one to 2 weeks post discharge. On followup basic metabolic profile need to be obtained to followup on patient's electrolytes. Patient's diabetes will also need to be reassessed per PCP. 2. Patient states she is to schedule an appointment for further management of her diabetes with Dr. Sharl Ma of endocrinology.  Discharge Diagnoses:  Principal Problem:  *Nausea and vomiting Active Problems:  DIABETES MELLITUS, TYPE I  Dehydration  Abdominal pain  Microcytic anemia  Non compliance w medication regimen  Hypertension  Pancreatitis  Constipation  Dyspnea on exertion   Discharge Condition: Stable and improved  Diet recommendation: Carb modified diet  Filed Weights   04/03/12 0100 04/03/12 0452 04/04/12 0539  Weight: 61.598 kg (135 lb 12.8 oz) 60.9 kg (134 lb 4.2 oz) 62.46 kg (137 lb 11.2 oz)    History of present illness:  This is a 46 y/o female with known h/o DM and gastroparesis presents today with c/o intractable nausea and vomiting today. No hematemeses. Patient also c/o abdominal pain , typical with here gastroparesis episodes. She reports no chest pain but has been having increasing SOB over the past two days. She does have some LE edema, she was on lasix 80mg  daily which was held by PCP 3 days ago. She was unable to kept food or meds down, so she came to the ER. History provided by patient      Hospital Course:  #1 nausea vomiting abdominal pain Patient was admitted with nausea vomiting abdominal pain it was felt this may be likely secondary to her gastroparesis as she was unable to fill the Reglan prior to admission versus acute pancreatitis as on admission she did have an elevated lipase of 165. Consideration was given to constipation as  abdominal films that show retained stool. Patient was admitted to the telemetry floor she was initially made n.p.o. and monitored. She was placed on IV fluids and follow. Patient was also placed on IV Reglan as well as proton pump inhibitor. Patient was treated supportively. Patient improved clinically on a daily basis. Patient's lipase levels decreased to 50 from admission. Patient did not have any further nausea vomiting or abdominal pain. Patient was subsequently started on clear liquid diet which he tolerated and was subsequently advanced to the carb modified diet which she tolerated. Patient did not have any further nausea vomiting abdominal pain. Patient had bowel movements and had improved clinically by day of discharge. Patient had been transitioned from IV Reglan to oral Reglan which she tolerated. Patient will be discharged home on oral Reglan and a proton pump inhibitor. Patient is to followup with PCP one to 2 weeks post discharge. Patient be discharged in stable and improved condition.  #2 dyspnea on exertion Patient had presented with some dyspnea on exertion with questionable etiology. Cardiac enzymes were cycled which were negative x3. A 2-D echo was obtained which had a normal ejection fraction of 55-60%. Patient did not have any wall motion abnormalities. D. dimer which was obtained was elevated at 0.64 and a such a CT angiogram of the chest was obtained which was negative for PE. Patient improved clinically and will be discharged in stable and improved condition.  #3 probable acute pancreatitis versus chemical pancreatitis Patient had presented with nausea vomiting abdominal pain. Lipase levels which were obtained were elevated 165. Patient  was initially made n.p.o. hydrated with IV fluids repeat lipase levels obtained came back at 50. Patient improved clinically did not have any further nausea vomiting or abdominal pain. Patient's diet was advanced from NPO to clear liquids to a carb  modified diet which she tolerated. Patient be discharged in stable and improved condition.  #4 poorly controlled  diabetes with gastroparesis Patient was noted to poorly controlled diabetes and gastroparesis which was felt to be part of the etiology of problem #1. Hemoglobin A1c which was obtained was elevated at 11.7. Patient was initially started on a home dose of 7030 12 units twice daily. Once patient was tolerating a carb modified diet and CBGs were elevated in the 200s to 300s and a such insulin 7030 has been increased to 14 units twice daily. Patient will followup with PCP as outpatient patient is to make an appointment with Dr. Sharl Ma of endocrinology for further management. Patient was maintained on Reglan during the hospitalization and she will be discharged home on Reglan.  #5 hypokalemia Admission patient was noted to be hypokalemic with a potassium of 3.0. It was felt patient's hypokalemia was secondary to GI losses secondary to problem #1. Patient's potassium was repleted and hypokalemia had resolved by day of discharge. Patient be discharged in stable and improved condition.  The rest of patient's chronic medical issues remained stable throughout the hospitalization and patient will be discharged in stable and improved condition.  Procedures: 2-D echo 04/03/2012 CT angiogram of the chest 04/03/2012 Acute abdominal series 04/02/2012  Consultations:  None  Discharge Exam: Filed Vitals:   04/04/12 1329 04/04/12 1405 04/04/12 2037 04/05/12 0645  BP: 96/49 102/54 100/63 115/51  Pulse: 93  93 93  Temp: 98.4 F (36.9 C)  98.3 F (36.8 C) 98.2 F (36.8 C)  TempSrc: Oral  Oral Oral  Resp: 18  18 20   Height:      Weight:      SpO2: 100%  99% 97%    General: NAD Cardiovascular: RRR Respiratory: CTAB  Discharge Instructions  Discharge Orders    Future Orders Please Complete By Expires   Diet Carb Modified      Increase activity slowly      Discharge instructions       Comments:   Follow up with GARBA,LAWAL, MD in 1-2 weeks. Follow up with Dr Sharl Ma as scheduled or in 1-2 weeks       Medication List     As of 04/05/2012 11:19 AM    TAKE these medications         furosemide 80 MG tablet   Commonly known as: LASIX   Take 80 mg by mouth daily as needed. Fluid retention      insulin aspart protamine-insulin aspart (70-30) 100 UNIT/ML injection   Commonly known as: NOVOLOG 70/30   Inject 14 Units into the skin 2 (two) times daily with a meal.      lisinopril 10 MG tablet   Commonly known as: PRINIVIL,ZESTRIL   Take 10 mg by mouth daily.      LORazepam 1 MG tablet   Commonly known as: ATIVAN   Take 1 tablet (1 mg total) by mouth every 8 (eight) hours.      metoCLOPramide 5 MG tablet   Commonly known as: REGLAN   Take 1 tablet (5 mg total) by mouth 4 (four) times daily -  before meals and at bedtime.      pantoprazole 40 MG tablet   Commonly known as: PROTONIX  Take 1 tablet (40 mg total) by mouth daily.      potassium chloride 10 MEQ tablet   Commonly known as: K-DUR   Take 1 tablet (10 mEq total) by mouth 2 (two) times daily.      pregabalin 150 MG capsule   Commonly known as: LYRICA   Take 150 mg by mouth 2 (two) times daily.      promethazine 25 MG tablet   Commonly known as: PHENERGAN   Take 1 tablet (25 mg total) by mouth every 6 (six) hours as needed. nausea           Follow-up Information    Follow up with [facility], [place]. In 1 week. (f/u with PCP in 1-2 weeks)    Contact information:   [address] [place] [address]       Follow up with KERR,JEFFREY, MD. Schedule an appointment as soon as possible for a visit in 1 week. (f/u in 1-2 weeks)    Contact information:   [address] [facility] [place] [address] [phone]           The results of significant diagnostics from this hospitalization (including imaging, microbiology, ancillary and laboratory) are listed below  for reference.    Significant Diagnostic Studies: Ct Angio Chest Pe W/cm &/or Wo Cm  04/03/2012  *RADIOLOGY REPORT*  Clinical Data: Chest pain with shortness of breath on exertion and elevated D-dimer levels.  Question pulmonary embolism.  CT ANGIOGRAPHY CHEST  Technique:  Multidetector CT imaging of the chest using the standard protocol during bolus administration of intravenous contrast. Multiplanar reconstructed images including MIPs were obtained and reviewed to evaluate the vascular anatomy.  Contrast: 60mL OMNIPAQUE IOHEXOL 350 MG/ML SOLN  Comparison: Chest radiographs 03/29/2010 and 04/02/2012.  Abdominal CT 03/29/2010.  Findings: The pulmonary arteries are well opacified with contrast. There is no evidence of acute pulmonary embolism.  The thoracic aorta appears normal.  There are no enlarged mediastinal or hilar lymph nodes.  A small amount of residual thymic tissue is noted. There is a small hiatal hernia.  There is no pleural or pericardial effusion. The lungs are clear. There is minimal central airway thickening.  The visualized upper abdomen appears unremarkable.  IMPRESSION:  1.  No evidence of acute pulmonary embolism other acute chest process. 2.  Mild central airway thickening. 3.  Small hiatal hernia.   Original Report Authenticated By: Gerrianne Scale, M.D.    Dg Abd Acute W/chest  04/02/2012  *RADIOLOGY REPORT*  Clinical Data: 46 year old female abdominal pain at the umbilicus. Nausea vomiting constipation.  ACUTE ABDOMEN SERIES (ABDOMEN 2 VIEW & CHEST 1 VIEW)  Comparison: 03/23/2012 and earlier.  Findings: Normal lung volumes. Normal cardiac size and mediastinal contours.  Visualized tracheal air column is within normal limits. No pneumothorax or pneumoperitoneum.  Lungs remain clear.  Right upper quadrant surgical clips. Nonobstructed bowel gas pattern.  Retained stool throughout the colon.  Other abdominal and pelvic visceral contours are within normal limits. No acute osseous  abnormality identified.  IMPRESSION: 1. Nonobstructed bowel gas pattern, no free air.  Retained stool throughout colon. 2.  Negative chest.   Original Report Authenticated By: Harley Hallmark, M.D.    Dg Abd Acute W/chest  03/23/2012  *RADIOLOGY REPORT*  Clinical Data: Abdominal pain  ACUTE ABDOMEN SERIES (ABDOMEN 2 VIEW & CHEST 1 VIEW)  Comparison: 03/29/2010  Findings: The lungs are clear without focal infiltrate, edema, pneumothorax or pleural effusion. The cardiopericardial silhouette  is within normal limits for size. Imaged bony structures of the thorax are intact. Telemetry leads overlie the chest.  Upright film shows no evidence for intraperitoneal free air.  There is some gas visible in the stomach, but otherwise a paucity of bowel gas is observed. Surgical clips in the right upper quadrant suggest prior cholecystectomy.  Hazy soft tissue attenuation over the central pelvis may be related to a distended bladder. Visualized bony structures are unremarkable.  IMPRESSION: No acute cardiopulmonary process.  No intraperitoneal free air.  No gaseous bowel dilatation to suggest obstruction.  A paucity of bowel gas may be related to fluid-filled bowel loops.   Original Report Authenticated By: ERIC A. MANSELL, M.D.     Microbiology: No results found for this or any previous visit (from the past 240 hour(s)).   Labs: Basic Metabolic Panel:  Lab 04/05/12 7829 04/04/12 0550 04/03/12 0840 04/03/12 0300 04/02/12 2111  NA 135 137 -- 137 136  K 4.9 5.3* -- 3.0* 3.4*  CL 104 106 -- 103 98  CO2 23 23 -- 26 26  GLUCOSE 142* 86 -- 107* 169*  BUN 5* 4* -- 8 9  CREATININE 0.87 0.73 -- 0.69 0.70  CALCIUM 9.2 9.0 -- 8.5 9.0  MG -- -- 1.6 -- --  PHOS -- -- -- -- --   Liver Function Tests:  Lab 04/04/12 0550 04/03/12 0840 04/02/12 2111  AST 19 20 21   ALT 9 9 10   ALKPHOS 72 73 85  BILITOT 0.5 0.5 0.4  PROT 6.3 6.2 7.3  ALBUMIN 2.9* 2.9* 3.5    Lab 04/04/12 0550 04/02/12 2306  LIPASE 50 165*    AMYLASE -- --   No results found for this basename: AMMONIA:5 in the last 168 hours CBC:  Lab 04/04/12 0550 04/03/12 0300 04/02/12 2111  WBC 4.9 5.9 5.9  NEUTROABS -- -- 4.5  HGB 9.5* 8.6* 9.6*  HCT 30.9* 27.4* 30.9*  MCV 74.8* 74.3* 74.5*  PLT 356 337 404*   Cardiac Enzymes:  Lab 04/03/12 1401 04/03/12 0840 04/03/12 0300 04/02/12 2306  CKTOTAL -- -- -- --  CKMB -- -- -- --  CKMBINDEX -- -- -- --  TROPONINI <0.30 <0.30 <0.30 <0.30   BNP: BNP (last 3 results) No results found for this basename: PROBNP:3 in the last 8760 hours CBG:  Lab 04/04/12 2131 04/04/12 1625 04/04/12 1136 04/04/12 0741 04/04/12 0604  GLUCAP 305* 298* 236* 108* 108*    Time coordinating discharge: 60 minutes  Signed:  THOMPSON,DANIEL  [facility] 04/05/2012, 11:19 AM

## 2012-06-25 ENCOUNTER — Inpatient Hospital Stay (HOSPITAL_COMMUNITY)
Admission: EM | Admit: 2012-06-25 | Discharge: 2012-06-30 | DRG: 639 | Disposition: A | Discharge status: Home / Self Care | Payer: 59 | Attending: Family Medicine | Admitting: Family Medicine

## 2012-06-25 ENCOUNTER — Inpatient Hospital Stay (HOSPITAL_COMMUNITY): Payer: 59

## 2012-06-25 ENCOUNTER — Encounter (HOSPITAL_COMMUNITY): Payer: Self-pay | Admitting: *Deleted

## 2012-06-25 DIAGNOSIS — F410 Panic disorder [episodic paroxysmal anxiety] without agoraphobia: Secondary | ICD-10-CM | POA: Diagnosis present

## 2012-06-25 DIAGNOSIS — Z79899 Other long term (current) drug therapy: Secondary | ICD-10-CM

## 2012-06-25 DIAGNOSIS — Z91148 Patient's other noncompliance with medication regimen for other reason: Secondary | ICD-10-CM

## 2012-06-25 DIAGNOSIS — K5900 Constipation, unspecified: Secondary | ICD-10-CM

## 2012-06-25 DIAGNOSIS — R112 Nausea with vomiting, unspecified: Secondary | ICD-10-CM

## 2012-06-25 DIAGNOSIS — E1010 Type 1 diabetes mellitus with ketoacidosis without coma: Principal | ICD-10-CM | POA: Diagnosis present

## 2012-06-25 DIAGNOSIS — K859 Acute pancreatitis without necrosis or infection, unspecified: Secondary | ICD-10-CM

## 2012-06-25 DIAGNOSIS — I10 Essential (primary) hypertension: Secondary | ICD-10-CM

## 2012-06-25 DIAGNOSIS — R109 Unspecified abdominal pain: Secondary | ICD-10-CM

## 2012-06-25 DIAGNOSIS — E876 Hypokalemia: Secondary | ICD-10-CM | POA: Diagnosis not present

## 2012-06-25 DIAGNOSIS — F411 Generalized anxiety disorder: Secondary | ICD-10-CM | POA: Diagnosis present

## 2012-06-25 DIAGNOSIS — Z87448 Personal history of other diseases of urinary system: Secondary | ICD-10-CM

## 2012-06-25 DIAGNOSIS — J309 Allergic rhinitis, unspecified: Secondary | ICD-10-CM

## 2012-06-25 DIAGNOSIS — E109 Type 1 diabetes mellitus without complications: Secondary | ICD-10-CM

## 2012-06-25 DIAGNOSIS — Z794 Long term (current) use of insulin: Secondary | ICD-10-CM

## 2012-06-25 DIAGNOSIS — E860 Dehydration: Secondary | ICD-10-CM

## 2012-06-25 DIAGNOSIS — D509 Iron deficiency anemia, unspecified: Secondary | ICD-10-CM

## 2012-06-25 DIAGNOSIS — Z9114 Patient's other noncompliance with medication regimen: Secondary | ICD-10-CM

## 2012-06-25 DIAGNOSIS — R197 Diarrhea, unspecified: Secondary | ICD-10-CM | POA: Diagnosis not present

## 2012-06-25 DIAGNOSIS — D72829 Elevated white blood cell count, unspecified: Secondary | ICD-10-CM | POA: Diagnosis present

## 2012-06-25 DIAGNOSIS — R0609 Other forms of dyspnea: Secondary | ICD-10-CM

## 2012-06-25 DIAGNOSIS — K219 Gastro-esophageal reflux disease without esophagitis: Secondary | ICD-10-CM | POA: Diagnosis present

## 2012-06-25 DIAGNOSIS — E1110 Type 2 diabetes mellitus with ketoacidosis without coma: Secondary | ICD-10-CM

## 2012-06-25 DIAGNOSIS — R0600 Dyspnea, unspecified: Secondary | ICD-10-CM

## 2012-06-25 LAB — BASIC METABOLIC PANEL
BUN: 17 mg/dL (ref 6–23)
BUN: 15 mg/dL (ref 6–23)
BUN: 14 mg/dL (ref 6–23)
BUN: 13 mg/dL (ref 6–23)
CO2: 13 mEq/L — ABNORMAL LOW (ref 19–32)
CO2: 11 mEq/L — ABNORMAL LOW (ref 19–32)
CO2: 16 mEq/L — ABNORMAL LOW (ref 19–32)
Calcium: 8.9 mg/dL (ref 8.4–10.5)
Chloride: 93 mEq/L — ABNORMAL LOW (ref 96–112)
Chloride: 101 mEq/L (ref 96–112)
Chloride: 103 mEq/L (ref 96–112)
Chloride: 105 mEq/L (ref 96–112)
Creatinine, Ser: 0.85 mg/dL (ref 0.50–1.10)
Creatinine, Ser: 0.83 mg/dL (ref 0.50–1.10)
GFR calc Af Amer: 87 mL/min — ABNORMAL LOW (ref >90)
GFR calc Af Amer: >90 mL/min (ref >90)
GFR calc Af Amer: >90 mL/min (ref >90)
GFR calc non Af Amer: 75 mL/min — ABNORMAL LOW (ref >90)
GFR calc non Af Amer: 85 mL/min — ABNORMAL LOW (ref >90)
Glucose, Bld: 457 mg/dL — ABNORMAL HIGH (ref 70–99)
Glucose, Bld: 282 mg/dL — ABNORMAL HIGH (ref 70–99)
Glucose, Bld: 234 mg/dL — ABNORMAL HIGH (ref 70–99)
Glucose, Bld: 160 mg/dL — ABNORMAL HIGH (ref 70–99)
Potassium: 4.2 mEq/L (ref 3.5–5.1)
Potassium: 4.7 mEq/L (ref 3.5–5.1)
Potassium: 4.2 mEq/L (ref 3.5–5.1)
Sodium: 130 mEq/L — ABNORMAL LOW (ref 135–145)
Sodium: 132 mEq/L — ABNORMAL LOW (ref 135–145)

## 2012-06-25 LAB — GLUCOSE, CAPILLARY
Glucose-Capillary: 376 mg/dL — ABNORMAL HIGH (ref 70–99)
Glucose-Capillary: 278 mg/dL — ABNORMAL HIGH (ref 70–99)
Glucose-Capillary: 281 mg/dL — ABNORMAL HIGH (ref 70–99)
Glucose-Capillary: 273 mg/dL — ABNORMAL HIGH (ref 70–99)
Glucose-Capillary: 225 mg/dL — ABNORMAL HIGH (ref 70–99)
Glucose-Capillary: 140 mg/dL — ABNORMAL HIGH (ref 70–99)

## 2012-06-25 LAB — BLOOD GAS, VENOUS
Acid-base deficit: 11.5 mmol/L — ABNORMAL HIGH (ref 0.0–2.0)
Bicarbonate: 16.3 mEq/L — ABNORMAL LOW (ref 20.0–24.0)
O2 Saturation: 29.6 %
Patient temperature: 98.6
TCO2: 16 mmol/L (ref 0–100)
pH, Ven: 7.173 — CL (ref 7.250–7.300)

## 2012-06-25 LAB — URINALYSIS, ROUTINE W REFLEX MICROSCOPIC
Glucose, UA: >1000 mg/dL — AB
Protein, ur: 30 mg/dL — AB
pH: 5.5 (ref 5.0–8.0)

## 2012-06-25 LAB — CBC WITH DIFFERENTIAL/PLATELET
Basophils Relative: 0 % (ref 0–1)
Eosinophils Absolute: 0 10*3/uL (ref 0.0–0.7)
HCT: 35.5 % — ABNORMAL LOW (ref 36.0–46.0)
Hemoglobin: 11 g/dL — ABNORMAL LOW (ref 12.0–15.0)
Lymphocytes Relative: 11 % — ABNORMAL LOW (ref 12–46)
Monocytes Relative: 4 % (ref 3–12)
Neutro Abs: 11.7 10*3/uL — ABNORMAL HIGH (ref 1.7–7.7)
Neutrophils Relative %: 85 % — ABNORMAL HIGH (ref 43–77)
RBC: 4.8 MIL/uL (ref 3.87–5.11)
WBC: 13.8 10*3/uL — ABNORMAL HIGH (ref 4.0–10.5)

## 2012-06-25 LAB — CBC
HCT: 34.9 % — ABNORMAL LOW (ref 36.0–46.0)
MCH: 23 pg — ABNORMAL LOW (ref 26.0–34.0)
MCHC: 30.9 g/dL (ref 30.0–36.0)
MCV: 74.4 fL — ABNORMAL LOW (ref 78.0–100.0)
RDW: 16 % — ABNORMAL HIGH (ref 11.5–15.5)

## 2012-06-25 LAB — LIPASE, BLOOD: Lipase: 13 U/L (ref 11–59)

## 2012-06-25 LAB — URINE MICROSCOPIC-ADD ON

## 2012-06-25 MED ORDER — INSULIN ASPART 100 UNIT/ML ~~LOC~~ SOLN
0.0000 [IU] | Freq: Three times a day (TID) | SUBCUTANEOUS | Status: DC
  Administered 2012-06-27: 7 [IU] via SUBCUTANEOUS
  Administered 2012-06-27 – 2012-06-28 (×2): 2 [IU] via SUBCUTANEOUS
  Administered 2012-06-28 – 2012-06-29 (×2): 3 [IU] via SUBCUTANEOUS
  Administered 2012-06-29 (×2): 2 [IU] via SUBCUTANEOUS
  Administered 2012-06-30: 1 [IU] via SUBCUTANEOUS
  Administered 2012-06-30: 9 [IU] via SUBCUTANEOUS

## 2012-06-25 MED ORDER — DEXTROSE-NACL 5-0.45 % IV SOLN: INTRAVENOUS | Status: DC

## 2012-06-25 MED ORDER — LORAZEPAM 2 MG/ML IJ SOLN
1.0000 mg | Freq: Once | INTRAMUSCULAR | Status: AC
  Administered 2012-06-25: 1 mg via INTRAVENOUS
  Filled 2012-06-25: qty 1

## 2012-06-25 MED ORDER — ENOXAPARIN SODIUM 40 MG/0.4ML ~~LOC~~ SOLN: 40.0000 mg | SUBCUTANEOUS | Status: DC

## 2012-06-25 MED ORDER — INSULIN GLARGINE 100 UNIT/ML ~~LOC~~ SOLN
5.0000 [IU] | Freq: Every day | SUBCUTANEOUS | Status: DC
  Administered 2012-06-25: 5 [IU] via SUBCUTANEOUS

## 2012-06-25 MED ORDER — SODIUM CHLORIDE 0.9 % IV SOLN
INTRAVENOUS | Status: DC
  Administered 2012-06-25: 2.2 [IU]/h via INTRAVENOUS
  Administered 2012-06-26: 3.3 [IU]/h via INTRAVENOUS
  Filled 2012-06-25 (×2): qty 1

## 2012-06-25 MED ORDER — ONDANSETRON HCL 4 MG/2ML IJ SOLN
4.0000 mg | Freq: Four times a day (QID) | INTRAMUSCULAR | Status: DC | PRN
Start: 2012-06-25 — End: 2012-06-30
  Administered 2012-06-25 – 2012-06-29 (×7): 4 mg via INTRAVENOUS
  Filled 2012-06-25 (×7): qty 2

## 2012-06-25 MED ORDER — INSULIN ASPART 100 UNIT/ML ~~LOC~~ SOLN
0.0000 [IU] | Freq: Every day | SUBCUTANEOUS | Status: DC
  Administered 2012-06-29: 3 [IU] via SUBCUTANEOUS

## 2012-06-25 MED ORDER — DEXTROSE 5 % IV SOLN
1.0000 g | INTRAVENOUS | Status: DC
  Administered 2012-06-25 – 2012-06-27 (×3): 1 g via INTRAVENOUS
  Filled 2012-06-25 (×3): qty 10

## 2012-06-25 MED ORDER — POTASSIUM CHLORIDE 10 MEQ/100ML IV SOLN
10.0000 meq | INTRAVENOUS | Status: AC
  Administered 2012-06-25 (×4): 10 meq via INTRAVENOUS
  Filled 2012-06-25: qty 100
  Filled 2012-06-25: qty 300

## 2012-06-25 MED ORDER — MORPHINE SULFATE 4 MG/ML IJ SOLN
4.0000 mg | Freq: Once | INTRAMUSCULAR | Status: AC
  Administered 2012-06-25: 4 mg via INTRAVENOUS
  Filled 2012-06-25: qty 1

## 2012-06-25 MED ORDER — GLUCOSE 40 % PO GEL: 1.0000 | ORAL | Status: DC | PRN

## 2012-06-25 MED ORDER — SODIUM CHLORIDE 0.9 % IV SOLN: 1000.0000 mL | INTRAVENOUS | Status: DC

## 2012-06-25 MED ORDER — DEXTROSE 50 % IV SOLN: 25.0000 mL | Freq: Once | INTRAVENOUS | Status: AC | PRN

## 2012-06-25 MED ORDER — LORAZEPAM 1 MG PO TABS
1.0000 mg | ORAL_TABLET | Freq: Four times a day (QID) | ORAL | Status: DC | PRN
  Administered 2012-06-25 – 2012-06-26 (×2): 1 mg via ORAL
  Filled 2012-06-25 (×2): qty 1

## 2012-06-25 MED ORDER — INSULIN ASPART 100 UNIT/ML ~~LOC~~ SOLN
10.0000 [IU] | Freq: Once | SUBCUTANEOUS | Status: AC
  Administered 2012-06-25: 10 [IU] via INTRAVENOUS
  Filled 2012-06-25: qty 1

## 2012-06-25 MED ORDER — SODIUM CHLORIDE 0.9 % IV BOLUS (SEPSIS)
1000.0000 mL | INTRAVENOUS | Status: AC
  Administered 2012-06-25: 1000 mL via INTRAVENOUS

## 2012-06-25 MED ORDER — DEXTROSE 50 % IV SOLN
25.0000 mL | INTRAVENOUS | Status: DC | PRN
  Administered 2012-06-26: 25 mL via INTRAVENOUS
  Filled 2012-06-25: qty 50

## 2012-06-25 MED ORDER — SODIUM CHLORIDE 0.9 % IV SOLN
1000.0000 mL | Freq: Once | INTRAVENOUS | Status: AC
  Administered 2012-06-25: 1000 mL via INTRAVENOUS

## 2012-06-25 MED ORDER — SODIUM CHLORIDE 0.9 % IV SOLN
INTRAVENOUS | Status: DC
  Filled 2012-06-25: qty 1

## 2012-06-25 MED ORDER — DEXTROSE 50 % IV SOLN: 50.0000 mL | Freq: Once | INTRAVENOUS | Status: AC | PRN

## 2012-06-25 MED ORDER — ONDANSETRON HCL 4 MG/2ML IJ SOLN
4.0000 mg | Freq: Once | INTRAMUSCULAR | Status: AC
  Administered 2012-06-25: 4 mg via INTRAVENOUS
  Filled 2012-06-25: qty 2

## 2012-06-25 MED ORDER — DEXTROSE-NACL 5-0.45 % IV SOLN
INTRAVENOUS | Status: DC
  Administered 2012-06-25: 17:00:00 via INTRAVENOUS

## 2012-06-25 MED ORDER — SODIUM CHLORIDE 0.9 % IV SOLN
INTRAVENOUS | Status: DC
  Administered 2012-06-25: 10:00:00 via INTRAVENOUS

## 2012-06-25 NOTE — ED Provider Notes (Signed)
Medical screening examination/treatment/procedure(s) were performed by non-physician practitioner and as supervising physician I was immediately available for consultation/collaboration.  Sunnie Nielsen, MD 06/25/12 2259

## 2012-06-25 NOTE — H&P (Addendum)
PCP:   Lonia Blood, MD   Chief Complaint:  Nausea vomiting  HPI: 47 year old female with history of diabetes mellitus, hypertension, GERD, anxiety came to the hospital with nausea vomiting. Patient says that she has been feeling sick for past few days also complained of cloudy urine, nausea and vomiting. She took last dose of insulin yesterday morning. Patient takes insulin 70/30 14 units in the morning and foot at bedtime. She denies chest pain or shortness of breath.  Review of Systems:  HEENT: Denies headache, blurred vision, runny nose, sore throat,  Neck: Denies thyroid problems,lymphadenopathy Chest : Denies shortness of breath, no history of COPD Heart : Denies Chest pain,  coronary arterey disease GI:see history of present illness GU: Positive dysuria, urgency, frequency of urination, hematuria Neuro: Denies stroke, seizures, syncope   Allergies:   Allergies  Allergen Reactions  . Anesthetics, Amide Nausea And Vomiting      Past Medical History  Diagnosis Date  . Diabetes mellitus     diagnosed age 79  . Hypertension   . Cataract   . Anxiety   . Anemia   . GERD (gastroesophageal reflux disease)   . Constipation 04/03/2012    Past Surgical History  Procedure Date  . Retinal detachment surgery     Prior to Admission medications   Medication Sig Start Date End Date Taking? Authorizing Provider  insulin aspart protamine-insulin aspart (NOVOLOG 70/30) (70-30) 100 UNIT/ML injection Inject 14 Units into the skin 2 (two) times daily with a meal. 04/05/12  Yes Rodolph Bong, MD  lisinopril (PRINIVIL,ZESTRIL) 10 MG tablet Take 10 mg by mouth every morning.    Yes Historical Provider, MD  LORazepam (ATIVAN) 1 MG tablet Take 1 mg by mouth every 8 (eight) hours as needed. For anxiety. 03/28/12  Yes Dorothea Ogle, MD  metoCLOPramide (REGLAN) 5 MG tablet Take 5 mg by mouth 3 (three) times daily as needed. For nausea. 04/05/12  Yes Rodolph Bong, MD  pantoprazole  (PROTONIX) 40 MG tablet Take 1 tablet (40 mg total) by mouth daily. 03/28/12  Yes Dorothea Ogle, MD  pregabalin (LYRICA) 150 MG capsule Take 150 mg by mouth 2 (two) times daily.   Yes Historical Provider, MD  promethazine (PHENERGAN) 25 MG tablet Take 25 mg by mouth every 6 (six) hours as needed. For nausea. 03/28/12  Yes Dorothea Ogle, MD  furosemide (LASIX) 80 MG tablet Take 80 mg by mouth daily as needed. For fluid retention.    Historical Provider, MD    Social History:  reports that she has never smoked. She does not have any smokeless tobacco history on file. She reports that she does not drink alcohol or use illicit drugs.  Family History  Problem Relation Age of Onset  . Hypertension Mother        Physical Exam: Blood pressure 144/78, pulse 112, temperature 98.2 F (36.8 C), temperature source Oral, resp. rate 18, last menstrual period 05/30/2012, SpO2 100.00%. Constitutional:   Patient is a well-developed and well-nourished female  Appears lethargic , she is cooperative with exam. Head: Normocephalic and atraumatic Mouth: Mucus membranes moist Eyes: PERRL, EOMI, conjunctivae normal Neck: Supple, No Thyromegaly Cardiovascular: RRR, S1 normal, S2 normal Pulmonary/Chest: CTAB, no wheezes, rales, or rhonchi Abdominal: Soft. Non-tender, non-distended, bowel sounds are normal, no masses, organomegaly, or guarding present.  Neurological: A&O x3, Strenght is normal and symmetric bilaterally, cranial nerve II-XII are grossly intact, no focal motor deficit, sensory intact to light touch bilaterally.  Extremities :  No Cyanosis, Clubbing or Edema   Labs on Admission:  Results for orders placed during the hospital encounter of 06/25/12 (from the past 48 hour(s))  GLUCOSE, CAPILLARY     Status: Abnormal   Collection Time   06/25/12  5:31 AM      Component Value Range Comment   Glucose-Capillary 383 (*) 70 - 99 mg/dL    Comment 1 Documented in Chart      Comment 2 Notify RN       GLUCOSE, CAPILLARY     Status: Abnormal   Collection Time   06/25/12  6:43 AM      Component Value Range Comment   Glucose-Capillary 432 (*) 70 - 99 mg/dL    Comment 1 Notify RN      Comment 2 Documented in Chart     CBC WITH DIFFERENTIAL     Status: Abnormal   Collection Time   06/25/12  7:00 AM      Component Value Range Comment   WBC 13.8 (*) 4.0 - 10.5 K/uL    RBC 4.80  3.87 - 5.11 MIL/uL    Hemoglobin 11.0 (*) 12.0 - 15.0 g/dL    HCT 16.1 (*) 09.6 - 46.0 %    MCV 74.0 (*) 78.0 - 100.0 fL    MCH 22.9 (*) 26.0 - 34.0 pg    MCHC 31.0  30.0 - 36.0 g/dL    RDW 04.5 (*) 40.9 - 15.5 %    Platelets 449 (*) 150 - 400 K/uL    Neutrophils Relative 85 (*) 43 - 77 %    Lymphocytes Relative 11 (*) 12 - 46 %    Monocytes Relative 4  3 - 12 %    Eosinophils Relative 0  0 - 5 %    Basophils Relative 0  0 - 1 %    Neutro Abs 11.7 (*) 1.7 - 7.7 K/uL    Lymphs Abs 1.5  0.7 - 4.0 K/uL    Monocytes Absolute 0.6  0.1 - 1.0 K/uL    Eosinophils Absolute 0.0  0.0 - 0.7 K/uL    Basophils Absolute 0.0  0.0 - 0.1 K/uL    WBC Morphology TOXIC GRANULATION     BASIC METABOLIC PANEL     Status: Abnormal   Collection Time   06/25/12  7:00 AM      Component Value Range Comment   Sodium 130 (*) 135 - 145 mEq/L REPEATED TO VERIFY   Potassium 4.2  3.5 - 5.1 mEq/L    Chloride 93 (*) 96 - 112 mEq/L REPEATED TO VERIFY   CO2 13 (*) 19 - 32 mEq/L REPEATED TO VERIFY   Glucose, Bld 457 (*) 70 - 99 mg/dL    BUN 17  6 - 23 mg/dL    Creatinine, Ser 8.11  0.50 - 1.10 mg/dL    Calcium 9.7  8.4 - 91.4 mg/dL    GFR calc non Af Amer 75 (*) >90 mL/min    GFR calc Af Amer 87 (*) >90 mL/min   LIPASE, BLOOD     Status: Normal   Collection Time   06/25/12  7:00 AM      Component Value Range Comment   Lipase 13  11 - 59 U/L   GLUCOSE, CAPILLARY     Status: Abnormal   Collection Time   06/25/12  8:03 AM      Component Value Range Comment   Glucose-Capillary 376 (*) 70 - 99 mg/dL    Comment 1  Notify RN     GLUCOSE,  CAPILLARY     Status: Abnormal   Collection Time   06/25/12  9:07 AM      Component Value Range Comment   Glucose-Capillary 278 (*) 70 - 99 mg/dL    Comment 1 Notify RN     BLOOD GAS, VENOUS     Status: Abnormal   Collection Time   06/25/12  9:45 AM      Component Value Range Comment   FIO2 0.21      pH, Ven 7.173 (*) 7.250 - 7.300    pCO2, Ven 46.2  45.0 - 50.0 mmHg    pO2, Ven 25.6 (*) 30.0 - 45.0 mmHg    Bicarbonate 16.3 (*) 20.0 - 24.0 mEq/L    TCO2 16.0  0 - 100 mmol/L    Acid-base deficit 11.5 (*) 0.0 - 2.0 mmol/L    O2 Saturation 29.6      Patient temperature 98.6      Collection site VEIN      Drawn by OUTSIDE SERVICE OPLAB      Sample type VEIN     GLUCOSE, CAPILLARY     Status: Abnormal   Collection Time   06/25/12  9:54 AM      Component Value Range Comment   Glucose-Capillary 309 (*) 70 - 99 mg/dL     Radiological Exams on Admission:  Assessment/Plan DKA Diabetes mellitus Nausea, vomiting Respiratory acidosis  DKA patient will put on glucose nebulizer, DKA protocol admit to the ICU. Patient has been started on IV fluids him  we'll continue to monitor the electrolytes  Respiratory acidosis  Patient is lethargic and going into respiratory acidosis Have consulted CCM They will follow the patient We'll obtain 2 view chest x-ray  Nausea vomiting We'll start Zofran when necessary  Questionable UTI We'll obtain urine analysis and urine culture  DVT prophylaxis Lovenox   Time Spent on Admission:  55 minutes  Hansika Leaming S [facility] Pager: [phone] 06/25/2012, 10:06 AM

## 2012-06-25 NOTE — ED Notes (Signed)
CBG on arrival 14

## 2012-06-25 NOTE — ED Notes (Signed)
Patient transported to X-ray 

## 2012-06-25 NOTE — ED Provider Notes (Signed)
History     CSN: [id number]  Arrival date & time 06/25/12  7829   First MD Initiated Contact with Patient 06/25/12 [phone]      Chief Complaint  Patient presents with  . Abdominal Pain  . Nausea  . Emesis    (Consider location/radiation/quality/duration/timing/severity/associated sxs/prior treatment) HPI Comments: This is a 47 year old female, with past medical history of diabetes, hypertension, and anxiety, who presents to the emergency department with chief complaint of abdominal pain. Patient states that she has had the pain for the past 2 days. It is associated with nausea, vomiting, and diarrhea. She denies any blood in her vomit or stool. She denies fever. She states that her pain is 10 out of 10. She has tried taking Phenergan with no relief. She denies chest pain, shortness of breath, constipation, numbness tingling of the extremities.  The history is provided by the patient. No language interpreter was used.    Past Medical History  Diagnosis Date  . Diabetes mellitus     diagnosed age 19  . Hypertension   . Cataract   . Anxiety   . Anemia   . GERD (gastroesophageal reflux disease)   . Constipation 04/03/2012    Past Surgical History  Procedure Date  . Retinal detachment surgery     Family History  Problem Relation Age of Onset  . Hypertension Mother     History  Substance Use Topics  . Smoking status: Never Smoker   . Smokeless tobacco: Not on file  . Alcohol Use: No    OB History    Grav Para Term Preterm Abortions TAB SAB Ect Mult Living                  Review of Systems  All other systems reviewed and are negative.    Allergies  Anesthetics, amide  Home Medications   Current Outpatient Rx  Name  Route  Sig  Dispense  Refill  . FUROSEMIDE 80 MG PO TABS   Oral   Take 80 mg by mouth daily as needed. Fluid retention         . INSULIN ASPART PROT & ASPART (70-30) 100 UNIT/ML [place] SUSP   Subcutaneous   Inject 14 Units into the skin 2 (two)  times daily with a meal.   10 mL   0   . LISINOPRIL 10 MG PO TABS   Oral   Take 10 mg by mouth daily.         Marland Kitchen LORAZEPAM 1 MG PO TABS   Oral   Take 1 tablet (1 mg total) by mouth every 8 (eight) hours.   30 tablet   0   . METOCLOPRAMIDE HCL 5 MG PO TABS   Oral   Take 1 tablet (5 mg total) by mouth 4 (four) times daily -  before meals and at bedtime.   120 tablet   0   . PANTOPRAZOLE SODIUM 40 MG PO TBEC   Oral   Take 1 tablet (40 mg total) by mouth daily.   30 tablet   3   . POTASSIUM CHLORIDE ER 10 MEQ PO TBCR   Oral   Take 1 tablet (10 mEq total) by mouth 2 (two) times daily.   30 tablet   1   . PREGABALIN 150 MG PO CAPS   Oral   Take 150 mg by mouth 2 (two) times daily.         Marland Kitchen PROMETHAZINE HCL 25 MG PO TABS  Oral   Take 1 tablet (25 mg total) by mouth every 6 (six) hours as needed. nausea   65 tablet   0     BP 137/88  Pulse 117  Temp 97.7 F (36.5 C) (Oral)  Resp 16  SpO2 98%  LMP 05/30/2012  Physical Exam  Nursing note and vitals reviewed. Constitutional: She is oriented to person, place, and time. She appears well-developed and well-nourished.  HENT:  Head: Normocephalic and atraumatic.  Eyes: Conjunctivae normal and EOM are normal. Pupils are equal, round, and reactive to light.  Neck: Normal range of motion. Neck supple.  Cardiovascular: Normal rate and regular rhythm.  Exam reveals no gallop and no friction rub.   No murmur heard. Pulmonary/Chest: Effort normal and breath sounds normal. No respiratory distress. She has no wheezes. She has no rales. She exhibits no tenderness.  Abdominal: Soft. Bowel sounds are normal. She exhibits no distension and no mass. There is no tenderness. There is no rebound and no guarding.  Musculoskeletal: Normal range of motion. She exhibits no edema and no tenderness.  Neurological: She is alert and oriented to person, place, and time.  Skin: Skin is warm and dry.  Psychiatric: She has a normal mood and  affect. Her behavior is normal. Judgment and thought content normal.    ED Course  Procedures (including critical care time)  Labs Reviewed  GLUCOSE, CAPILLARY - Abnormal; Notable for the following:    Glucose-Capillary 383 (*)     All other components within normal limits  CBC WITH DIFFERENTIAL  BASIC METABOLIC PANEL  URINALYSIS, ROUTINE W REFLEX MICROSCOPIC  PREGNANCY, URINE  LIPASE, BLOOD   No results found. CRITICAL CARE Performed by: Roxy Horseman   Total critical care time: 33  Critical care time was exclusive of separately billable procedures and treating other patients.  Critical care was necessary to treat or prevent imminent or life-threatening deterioration.  Critical care was time spent personally by me on the following activities: development of treatment plan with patient and/or surrogate as well as nursing, discussions with consultants, evaluation of patient's response to treatment, examination of patient, obtaining history from patient or surrogate, ordering and performing treatments and interventions, ordering and review of laboratory studies, ordering and review of radiographic studies, pulse oximetry and re-evaluation of patient's condition.   1. DKA (diabetic ketoacidoses)       MDM  47 year old female with abdominal pain. Treat the patient's pain with morphine, nausea with Zofran, and will give 10 units of insulin. Will obtain routine labs and reevaluate.  Discussed this patient with Dr. Dierdre Highman.  9:06 AM Received labs.  Patient is in DKA.  Discussed with Dr. Freida Busman, who personally saw the patient.  The patient will be admitted.        Roxy Horseman, PA-C 06/25/12 [phone]

## 2012-06-25 NOTE — ED Notes (Signed)
MD at bedside. 

## 2012-06-25 NOTE — ED Notes (Signed)
Pt c/o abdominal pain, n/v since Monday. Pt states n/v prior to abdominal pain. Pt is pale, weak, and wretching in triage. Pt has hx of DM II unable to recall when last took insulin.

## 2012-06-25 NOTE — ED Notes (Signed)
Unable to obtain IV access. IV RN paged and call return. IV RN en route.

## 2012-06-25 NOTE — ED Provider Notes (Signed)
Medical screening examination/treatment/procedure(s) were conducted as a shared visit with non-physician practitioner(s) and myself.  I personally evaluated the patient during the encounter  Patient seen examined. Labs consistent with DKA. Patient given IV fluids and placed on glucose stabilizer and will be admitted to triad hospitalist  Toy Baker, MD 06/25/12 [phone]

## 2012-06-26 LAB — GLUCOSE, CAPILLARY
Glucose-Capillary: 137 mg/dL — ABNORMAL HIGH (ref 70–99)
Glucose-Capillary: 146 mg/dL — ABNORMAL HIGH (ref 70–99)
Glucose-Capillary: 133 mg/dL — ABNORMAL HIGH (ref 70–99)
Glucose-Capillary: 137 mg/dL — ABNORMAL HIGH (ref 70–99)
Glucose-Capillary: 321 mg/dL — ABNORMAL HIGH (ref 70–99)
Glucose-Capillary: 393 mg/dL — ABNORMAL HIGH (ref 70–99)
Glucose-Capillary: 187 mg/dL — ABNORMAL HIGH (ref 70–99)
Glucose-Capillary: 120 mg/dL — ABNORMAL HIGH (ref 70–99)
Glucose-Capillary: 105 mg/dL — ABNORMAL HIGH (ref 70–99)
Glucose-Capillary: 137 mg/dL — ABNORMAL HIGH (ref 70–99)
Glucose-Capillary: 46 mg/dL — ABNORMAL LOW (ref 70–99)
Glucose-Capillary: 125 mg/dL — ABNORMAL HIGH (ref 70–99)

## 2012-06-26 LAB — BASIC METABOLIC PANEL
CO2: 12 mEq/L — ABNORMAL LOW (ref 19–32)
CO2: 15 mEq/L — ABNORMAL LOW (ref 19–32)
Chloride: 100 mEq/L (ref 96–112)
Chloride: 108 mEq/L (ref 96–112)
Creatinine, Ser: 0.96 mg/dL (ref 0.50–1.10)
Creatinine, Ser: 0.95 mg/dL (ref 0.50–1.10)
GFR calc Af Amer: 82 mL/min — ABNORMAL LOW (ref >90)
Glucose, Bld: 127 mg/dL — ABNORMAL HIGH (ref 70–99)
Potassium: 4.7 mEq/L (ref 3.5–5.1)
Sodium: 132 mEq/L — ABNORMAL LOW (ref 135–145)
Sodium: 139 mEq/L (ref 135–145)

## 2012-06-26 LAB — CBC
Platelets: 407 10*3/uL — ABNORMAL HIGH (ref 150–400)
RBC: 4.5 MIL/uL (ref 3.87–5.11)
RDW: 16.2 % — ABNORMAL HIGH (ref 11.5–15.5)
WBC: 13.8 10*3/uL — ABNORMAL HIGH (ref 4.0–10.5)

## 2012-06-26 MED ORDER — MORPHINE SULFATE 2 MG/ML IJ SOLN
1.0000 mg | INTRAMUSCULAR | Status: DC | PRN
  Administered 2012-06-26: 1 mg via INTRAVENOUS

## 2012-06-26 MED ORDER — INSULIN ASPART PROT & ASPART (70-30 MIX) 100 UNIT/ML ~~LOC~~ SUSP
14.0000 [IU] | Freq: Two times a day (BID) | SUBCUTANEOUS | Status: DC
  Administered 2012-06-26 – 2012-06-28 (×4): 14 [IU] via SUBCUTANEOUS
  Filled 2012-06-26: qty 3

## 2012-06-26 MED ORDER — SIMETHICONE 80 MG PO CHEW
80.0000 mg | CHEWABLE_TABLET | Freq: Four times a day (QID) | ORAL | Status: DC
  Administered 2012-06-26 – 2012-06-30 (×9): 80 mg via ORAL
  Filled 2012-06-26 (×18): qty 1

## 2012-06-26 MED ORDER — SODIUM CHLORIDE 0.9 % IV SOLN
INTRAVENOUS | Status: DC
  Administered 2012-06-26 (×2): via INTRAVENOUS

## 2012-06-26 MED ORDER — PROMETHAZINE HCL 25 MG/ML IJ SOLN: 12.5000 mg | Freq: Four times a day (QID) | INTRAMUSCULAR | Status: DC | PRN

## 2012-06-26 MED ORDER — LORAZEPAM 2 MG/ML IJ SOLN
1.0000 mg | INTRAMUSCULAR | Status: DC | PRN
  Administered 2012-06-26 – 2012-06-27 (×4): 1 mg via INTRAVENOUS
  Filled 2012-06-26 (×3): qty 1

## 2012-06-26 MED ORDER — LORAZEPAM 2 MG/ML IJ SOLN
INTRAMUSCULAR | Status: AC
  Filled 2012-06-26: qty 1

## 2012-06-26 MED ORDER — MORPHINE SULFATE 2 MG/ML IJ SOLN
INTRAMUSCULAR | Status: AC
  Filled 2012-06-26: qty 1

## 2012-06-26 MED ORDER — INSULIN NPH (HUMAN) (ISOPHANE) 100 UNIT/ML ~~LOC~~ SUSP
14.0000 [IU] | Freq: Two times a day (BID) | SUBCUTANEOUS | Status: DC
  Filled 2012-06-26: qty 10

## 2012-06-26 NOTE — Progress Notes (Signed)
06/26/12  Patient takes 70/30 insulin 14 units twice a day at home.  Recommend that her dosage be changed to 70/30 mixed insulin instead of  NPH as ordered when glucostabilizer is discontinued (or 2 hours before discontinuing glucostabilizer) and patient will be eating.Continue Novolog SENSITIVE correction scale AC & HS.  Patient sees Dr. August Luz as PCP.  Has been on this dosage of insulin for about 1 month.  Unable to afford Lantus and Novolog which she was on previously.   Will continue to follow while in hospital.  Thanks, Smith Mince RN BSN CDE

## 2012-06-26 NOTE — Progress Notes (Signed)
Nutrition Brief Note  Patient identified on the Malnutrition Screening Tool (MST) Report  Body mass index is 23.75 kg/(m^2). Patient meets criteria for wnl based on current BMI.   Current diet order is CHO Mod Med, pt has not yet received tray. Labs and medications reviewed.  CBG (last 3)   Basename 06/26/12 1212 06/26/12 1121 06/26/12 1022  GLUCAP 165* 187* 234*    Lab Results  Component Value Date   HGBA1C 11.7* 04/04/2012  Pt HgBA1C has been slowly improving since September.  Last seen by clinical nutrition staff in September when pt was educated on improving diabetes.  Pt sleeping at time of visit.  Awakens to voice, however requests to go back to sleep.  Lunch at bedside is not consumed. No nutrition interventions warranted at this time. If nutrition issues arise, please consult RD.   Loyce Dys, MS RD LDN Clinical Inpatient Dietitian Pager: [phone] Weekend/After hours pager: [phone]

## 2012-06-26 NOTE — Progress Notes (Signed)
CBG rechecked and came up to 137. Will continue to monitor.

## 2012-06-26 NOTE — Progress Notes (Signed)
Subjective: Patient seen and examined, had panic attacks this morning, gave ativan this morning. Her insulin infusion was stopped midnight, her Co2 level now is 12.   Objective: Vital signs in last 24 hours: Temp:  [98.2 F (36.8 C)-99.4 F (37.4 C)] 98.2 F (36.8 C) (01/02 0800) Pulse Rate:  [101-123] 122  (01/02 0500) Resp:  [18-25] 18  (01/02 0500) BP: (98-157)/(43-84) 147/75 mmHg (01/02 0500) SpO2:  [97 %-100 %] 100 % (01/02 0500) Weight:  [58.9 kg (129 lb 13.6 oz)-59.8 kg (131 lb 13.4 oz)] 58.9 kg (129 lb 13.6 oz) (01/02 0342) Weight change:         Intake/Output from previous day: 01/01 0701 - 01/02 0700 In: 1873.7 [I.V.:1473.7; IV Piggyback:400] Out: 2125 [Urine:2125]     Physical Exam: Head: Normocephalic, atraumatic.  Eyes: No signs of jaundice, EOMI Nose: Mucous membranes dry.   Neck: supple,No deformities, masses, or tenderness noted. Lungs: Normal respiratory effort. B/L Clear to auscultation, no crackles or wheezes.  Heart: Regular RR. S1 and S2 normal  Abdomen: BS normoactive. Soft, Nondistended, non-tender.  Extremities: No pretibial edema, no erythema   Lab Results: Basic Metabolic Panel:  Basename 06/26/12 0515 06/25/12 2210  NA 132* 135  K 4.7 4.2  CL 100 105  CO2 12* 17*  GLUCOSE 360* 143*  BUN 14 13  CREATININE 0.96 0.82  CALCIUM 9.2 8.8  MG -- --  PHOS -- --   Liver Function Tests: No results found for this basename: AST:2,ALT:2,ALKPHOS:2,BILITOT:2,PROT:2,ALBUMIN:2 in the last 72 hours  Basename 06/25/12 0700  LIPASE 13  AMYLASE --   No results found for this basename: AMMONIA:2 in the last 72 hours CBC:  Basename 06/26/12 0515 06/25/12 1140 06/25/12 0700  WBC 13.8* 14.1* --  NEUTROABS -- -- 11.7*  HGB 10.3* 10.8* --  HCT 33.4* 34.9* --  MCV 74.2* 74.4* --  PLT 407* 390 --   Cardiac Enzymes: No results found for this basename: CKTOTAL:3,CKMB:3,CKMBINDEX:3,TROPONINI:3 in the last 72 hours BNP: No results found for this  basename: PROBNP:3 in the last 72 hours D-Dimer: No results found for this basename: DDIMER:2 in the last 72 hours CBG:  Basename 06/26/12 0914 06/26/12 0803 06/25/12 2353 06/25/12 2306 06/25/12 2203 06/25/12 2100  GLUCAP 321* 393* 133* 136* 146* 137*   Hemoglobin A1C: No results found for this basename: HGBA1C in the last 72 hours Fasting Lipid Panel: No results found for this basename: CHOL,HDL,LDLCALC,TRIG,CHOLHDL,LDLDIRECT in the last 72 hours Thyroid Function Tests: No results found for this basename: TSH,T4TOTAL,FREET4,T3FREE,THYROIDAB in the last 72 hours Anemia Panel: No results found for this basename: VITAMINB12,FOLATE,FERRITIN,TIBC,IRON,RETICCTPCT in the last 72 hours Coagulation: No results found for this basename: LABPROT:2,INR:2 in the last 72 hours Urine Drug Screen: Drugs of Abuse     Component Value Date/Time   LABOPIA NONE DETECTED 03/29/2010 0630   COCAINSCRNUR NONE DETECTED 03/29/2010 0630   LABBENZ NONE DETECTED 03/29/2010 0630   AMPHETMU NONE DETECTED 03/29/2010 0630   THCU NONE DETECTED 03/29/2010 0630   LABBARB  Value: NONE DETECTED        DRUG SCREEN FOR MEDICAL PURPOSES ONLY.  IF CONFIRMATION IS NEEDED FOR ANY PURPOSE, NOTIFY LAB WITHIN 5 DAYS.        LOWEST DETECTABLE LIMITS FOR URINE DRUG SCREEN Drug Class       Cutoff (ng/mL) Amphetamine      1000 Barbiturate      200 Benzodiazepine   200 Tricyclics       300 Opiates  300 Cocaine          300 THC              50 03/29/2010 0630    Alcohol Level: No results found for this basename: ETH:2 in the last 72 hours Urinalysis:  Basename 06/25/12 1124  COLORURINE YELLOW  LABSPEC 1.025  PHURINE 5.5  GLUCOSEU >1000*  HGBUR TRACE*  BILIRUBINUR NEGATIVE  KETONESUR >80*  PROTEINUR 30*  UROBILINOGEN 0.2  NITRITE NEGATIVE  LEUKOCYTESUR TRACE*    Recent Results (from the past 240 hour(s))  MRSA PCR SCREENING     Status: Normal   Collection Time   06/25/12 11:31 AM      Component Value Range Status  Comment   MRSA by PCR NEGATIVE  NEGATIVE Final     Studies/Results: Dg Chest 2 View  06/25/2012  *RADIOLOGY REPORT*  Clinical Data: Shortness of breath.  Nausea and vomiting.  CHEST - 2 VIEW  Comparison: 03/29/2010  Findings: There is peribronchial thickening consistent with bronchitis.  There are no infiltrates or effusions.  Heart size and vascularity are normal.  No significant osseous abnormality.  IMPRESSION: Bronchitic changes.   Original Report Authenticated By: Francene Boyers, M.D.    Dg Cervical Spine 2 Or 3 Views  06/25/2012  *RADIOLOGY REPORT*  Clinical Data: Neck stiffness, mass  CERVICAL SPINE - 2-3 VIEW  Comparison: None.  Findings: The cervical spine as well visualized to the cervicothoracic junction on the swimmer's lateral view.  No evidence of acute fracture or malalignment.  Mild reversal of the typical cervical lordosis.  Mild degenerative change with uncovertebral joint hypertrophy at C4-C5, and C5-C6.  No prevertebral soft tissue swelling.  IMPRESSION:  1.  No acute fracture or malalignment. 2.  Mild reversal of the normal cervical lordosis 3.  Cervical spondylitic changes at C4-C5 and C5-C6.   Original Report Authenticated By: Malachy Moan, M.D.    Dg Abd 1 View  06/25/2012  *RADIOLOGY REPORT*  Clinical Data: Abdominal distension, pain  ABDOMEN - 1 VIEW  Comparison: Prior abdominal radiographs 04/02/2012  Findings: Mild gaseous distension of the stomach and transverse colon.  Gas is also noted in the ascending, and descending colons. Lung bases are clear.  Surgical clips in the right upper quadrant suggest prior cholecystectomy.  No acute osseous abnormality.  IMPRESSION:  Nonspecific but nonobstructed bowel gas pattern with gaseous distension of the stomach and colon to the level of the distal descending colon without significant dilation.   Original Report Authenticated By: Malachy Moan, M.D.     Medications: Scheduled Meds:   . cefTRIAXone (ROCEPHIN)  IV  1 g  Intravenous Q24H  . insulin aspart  0-5 Units Subcutaneous QHS  . insulin aspart  0-9 Units Subcutaneous TID WC  . insulin NPH  14 Units Subcutaneous BID  . LORazepam       Continuous Infusions:   . sodium chloride 100 mL/hr at 06/26/12 0744  . insulin (NOVOLIN-R) infusion 3.3 Units/hr (06/26/12 0820)   PRN Meds:.dextrose, dextrose, LORazepam, ondansetron, promethazine    Active Problems:  DIABETES MELLITUS, TYPE I  DKA (diabetic ketoacidoses)  Nausea and vomiting  Diabetic ketoacidosis Continue glucostabilizer Insulin infusion  Panic disorder Continue ativan 1 mg IV q  4 hr prn  Diabetes mellitus Will restart NPH insulin once she is off insulin infusion.  ? UTI Urine culture is pending Continue rocephin   LOS: 1 day  Assessment/Plan: [facility] Pager: [phone] 06/26/2012, 10:02 AM

## 2012-06-26 NOTE — Progress Notes (Signed)
[id number]/Rhonda Davis, RN, BSN, CCM: CHART REVIEWED AND UPDATED.  Next chart review due on [id number]. NO DISCHARGE NEEDS PRESENT AT THIS TIME. CASE MANAGEMENT [phone] 

## 2012-06-26 NOTE — Progress Notes (Signed)
CBG 47. Pt A&Ox4, asymptomatic, VSS. Pt given D50 1/2amp of 25ml. Will recheck CBG and continue to monitor.

## 2012-06-27 DIAGNOSIS — K5900 Constipation, unspecified: Secondary | ICD-10-CM

## 2012-06-27 LAB — GLUCOSE, CAPILLARY
Glucose-Capillary: 330 mg/dL — ABNORMAL HIGH (ref 70–99)
Glucose-Capillary: 165 mg/dL — ABNORMAL HIGH (ref 70–99)
Glucose-Capillary: 121 mg/dL — ABNORMAL HIGH (ref 70–99)

## 2012-06-27 LAB — BASIC METABOLIC PANEL
BUN: 9 mg/dL (ref 6–23)
BUN: 10 mg/dL (ref 6–23)
BUN: 7 mg/dL (ref 6–23)
CO2: 16 mEq/L — ABNORMAL LOW (ref 19–32)
Calcium: 8.9 mg/dL (ref 8.4–10.5)
Chloride: 101 mEq/L (ref 96–112)
Creatinine, Ser: 0.81 mg/dL (ref 0.50–1.10)
Creatinine, Ser: 0.81 mg/dL (ref 0.50–1.10)
GFR calc Af Amer: >90 mL/min (ref >90)
GFR calc Af Amer: >90 mL/min (ref >90)
GFR calc non Af Amer: 85 mL/min — ABNORMAL LOW (ref >90)
GFR calc non Af Amer: 86 mL/min — ABNORMAL LOW (ref >90)
GFR calc non Af Amer: 86 mL/min — ABNORMAL LOW (ref >90)
Glucose, Bld: 221 mg/dL — ABNORMAL HIGH (ref 70–99)
Glucose, Bld: 105 mg/dL — ABNORMAL HIGH (ref 70–99)
Potassium: 3.9 mEq/L (ref 3.5–5.1)

## 2012-06-27 LAB — URINE CULTURE: Colony Count: NO GROWTH

## 2012-06-27 LAB — CBC
Hemoglobin: 10.1 g/dL — ABNORMAL LOW (ref 12.0–15.0)
MCH: 22.8 pg — ABNORMAL LOW (ref 26.0–34.0)
MCHC: 30.5 g/dL (ref 30.0–36.0)
MCV: 74.7 fL — ABNORMAL LOW (ref 78.0–100.0)

## 2012-06-27 MED ORDER — LORAZEPAM 1 MG PO TABS
1.0000 mg | ORAL_TABLET | Freq: Three times a day (TID) | ORAL | Status: DC | PRN
  Administered 2012-06-27 – 2012-06-28 (×2): 1 mg via ORAL
  Filled 2012-06-27 (×2): qty 1

## 2012-06-27 MED ORDER — DEXTROSE-NACL 5-0.45 % IV SOLN
INTRAVENOUS | Status: DC
  Administered 2012-06-27: 18:00:00 via INTRAVENOUS
  Administered 2012-06-28: 75 mL/h via INTRAVENOUS

## 2012-06-27 NOTE — Progress Notes (Signed)
Subjective: Patient seen and examined, very drowsy, has been getting ativan prn for the panic attacks. She is now off insulin infusion and has been getting Insulin 70/30 14 units SQ BID.  Objective: Vital signs in last 24 hours: Temp:  [97.7 F (36.5 C)-99 F (37.2 C)] 98.5 F (36.9 C) (01/03 0800) Pulse Rate:  [102-119] 108  (01/03 0800) Resp:  [16-24] 23  (01/03 0800) BP: (145-158)/(63-84) 148/84 mmHg (01/03 0800) SpO2:  [99 %-100 %] 100 % (01/03 0800) Weight:  [59.8 kg (131 lb 13.4 oz)] 59.8 kg (131 lb 13.4 oz) (01/03 0700) Weight change: 0 kg (0 lb) Last BM Date: 06/26/12      Intake/Output from previous day: 01/02 0701 - 01/03 0700 In: 1460 [P.O.:360; I.V.:1100] Out: 951 [Urine:950; Stool:1]     Physical Exam: Head: Normocephalic, atraumatic.  Eyes: No signs of jaundice, EOMI Nose: Mucous membranes dry.   Neck: supple,No deformities, masses, or tenderness noted. Lungs: Normal respiratory effort. B/L Clear to auscultation, no crackles or wheezes.  Heart: Regular RR. S1 and S2 normal  Abdomen: BS normoactive. Soft, Nondistended, non-tender.  Extremities: No pretibial edema, no erythema   Lab Results: Basic Metabolic Panel:  Basename 06/27/12 0325 06/26/12 1329  NA 132* 139  K 3.9 4.0  CL 102 108  CO2 16* 15*  GLUCOSE 221* 127*  BUN 9 13  CREATININE 0.82 0.95  CALCIUM 9.1 9.2  MG -- --  PHOS -- --   Liver Function Tests: No results found for this basename: AST:2,ALT:2,ALKPHOS:2,BILITOT:2,PROT:2,ALBUMIN:2 in the last 72 hours  Basename 06/25/12 0700  LIPASE 13  AMYLASE --   No results found for this basename: AMMONIA:2 in the last 72 hours CBC:  Basename 06/27/12 0325 06/26/12 0515 06/25/12 0700  WBC 10.8* 13.8* --  NEUTROABS -- -- 11.7*  HGB 10.1* 10.3* --  HCT 33.1* 33.4* --  MCV 74.7* 74.2* --  PLT 395 407* --   Cardiac Enzymes: No results found for this basename: CKTOTAL:3,CKMB:3,CKMBINDEX:3,TROPONINI:3 in the last 72 hours BNP: No  results found for this basename: PROBNP:3 in the last 72 hours D-Dimer: No results found for this basename: DDIMER:2 in the last 72 hours CBG:  Basename 06/27/12 0736 06/26/12 1959 06/26/12 1840 06/26/12 1746 06/26/12 1546 06/26/12 1439  GLUCAP 330* 125* 137* 46* 111* 105*    Drugs of Abuse     Component Value Date/Time   LABOPIA NONE DETECTED 03/29/2010 0630   COCAINSCRNUR NONE DETECTED 03/29/2010 0630   LABBENZ NONE DETECTED 03/29/2010 0630   AMPHETMU NONE DETECTED 03/29/2010 0630   THCU NONE DETECTED 03/29/2010 0630   LABBARB  Value: NONE DETECTED        DRUG SCREEN FOR MEDICAL PURPOSES ONLY.  IF CONFIRMATION IS NEEDED FOR ANY PURPOSE, NOTIFY LAB WITHIN 5 DAYS.        LOWEST DETECTABLE LIMITS FOR URINE DRUG SCREEN Drug Class       Cutoff (ng/mL) Amphetamine      1000 Barbiturate      200 Benzodiazepine   200 Tricyclics       300 Opiates          300 Cocaine          300 THC              50 03/29/2010 0630    Alcohol Level: No results found for this basename: ETH:2 in the last 72 hours Urinalysis:  Basename 06/25/12 1124  COLORURINE YELLOW  LABSPEC 1.025  PHURINE 5.5  GLUCOSEU >1000*  HGBUR TRACE*  BILIRUBINUR NEGATIVE  KETONESUR >80*  PROTEINUR 30*  UROBILINOGEN 0.2  NITRITE NEGATIVE  LEUKOCYTESUR TRACE*    Recent Results (from the past 240 hour(s))  URINE CULTURE     Status: Normal   Collection Time   06/25/12 11:25 AM      Component Value Range Status Comment   Specimen Description URINE, CLEAN CATCH   Final    Special Requests NONE   Final    Culture  Setup Time 06/25/2012 22:40   Final    Colony Count NO GROWTH   Final    Culture NO GROWTH   Final    Report Status 06/27/2012 FINAL   Final   MRSA PCR SCREENING     Status: Normal   Collection Time   06/25/12 11:31 AM      Component Value Range Status Comment   MRSA by PCR NEGATIVE  NEGATIVE Final     Studies/Results: Dg Chest 2 View  06/25/2012  *RADIOLOGY REPORT*  Clinical Data: Shortness of breath.  Nausea and  vomiting.  CHEST - 2 VIEW  Comparison: 03/29/2010  Findings: There is peribronchial thickening consistent with bronchitis.  There are no infiltrates or effusions.  Heart size and vascularity are normal.  No significant osseous abnormality.  IMPRESSION: Bronchitic changes.   Original Report Authenticated By: Francene Boyers, M.D.    Dg Cervical Spine 2 Or 3 Views  06/25/2012  *RADIOLOGY REPORT*  Clinical Data: Neck stiffness, mass  CERVICAL SPINE - 2-3 VIEW  Comparison: None.  Findings: The cervical spine as well visualized to the cervicothoracic junction on the swimmer's lateral view.  No evidence of acute fracture or malalignment.  Mild reversal of the typical cervical lordosis.  Mild degenerative change with uncovertebral joint hypertrophy at C4-C5, and C5-C6.  No prevertebral soft tissue swelling.  IMPRESSION:  1.  No acute fracture or malalignment. 2.  Mild reversal of the normal cervical lordosis 3.  Cervical spondylitic changes at C4-C5 and C5-C6.   Original Report Authenticated By: Malachy Moan, M.D.    Dg Abd 1 View  06/25/2012  *RADIOLOGY REPORT*  Clinical Data: Abdominal distension, pain  ABDOMEN - 1 VIEW  Comparison: Prior abdominal radiographs 04/02/2012  Findings: Mild gaseous distension of the stomach and transverse colon.  Gas is also noted in the ascending, and descending colons. Lung bases are clear.  Surgical clips in the right upper quadrant suggest prior cholecystectomy.  No acute osseous abnormality.  IMPRESSION:  Nonspecific but nonobstructed bowel gas pattern with gaseous distension of the stomach and colon to the level of the distal descending colon without significant dilation.   Original Report Authenticated By: Malachy Moan, M.D.     Medications: Scheduled Meds:    . cefTRIAXone (ROCEPHIN)  IV  1 g Intravenous Q24H  . insulin aspart  0-5 Units Subcutaneous QHS  . insulin aspart  0-9 Units Subcutaneous TID WC  . insulin aspart protamine-insulin aspart  14 Units  Subcutaneous BID WC  . simethicone  80 mg Oral QID   Continuous Infusions:    . sodium chloride 100 mL/hr at 06/27/12 0700  . insulin (NOVOLIN-R) infusion Stopped (06/26/12 1548)   PRN Meds:.dextrose, dextrose, LORazepam, morphine injection, ondansetron, promethazine  Assessment/Plan:  Active Problems:  DIABETES MELLITUS, TYPE I  DKA (diabetic ketoacidoses)  Nausea and vomiting  Diabetic ketoacidosis Resolved Co2 still mildly elevated Will follow BMP today  Panic disorder Will d/c IV ativan Ativan 1 mg Po q 8 hr prn  Diabetes mellitus  Continue  Insulin 70/30 14 Sq BID SSI  ? UTI Urine culture shows no growth Will d/c rocephin   LOS: 2 days   [facility] Pager: [phone] 06/27/2012, 8:58 AM

## 2012-06-27 NOTE — Progress Notes (Signed)
Assisted patient to the [facility] at this time. Pt with request for anxiety med's

## 2012-06-27 NOTE — Progress Notes (Signed)
06/27/12  Fasting CBG was 335 mg/dl.  Lunch CBG down to 165 mg/dl.  Suggest increasing 70/30 insulin dosage to 16 units BID if blood sugars continue greater than 180 mg/dl.  Continue Novolog SENSITIVE correction scale AC & HS. Will continue to follow while in hospital.  Smith Mince RN BSN CDE

## 2012-06-28 DIAGNOSIS — E860 Dehydration: Secondary | ICD-10-CM

## 2012-06-28 LAB — BASIC METABOLIC PANEL
BUN: 6 mg/dL (ref 6–23)
Calcium: 8.9 mg/dL (ref 8.4–10.5)
Chloride: 101 mEq/L (ref 96–112)
Creatinine, Ser: 0.73 mg/dL (ref 0.50–1.10)
GFR calc Af Amer: >90 mL/min (ref >90)

## 2012-06-28 LAB — GLUCOSE, CAPILLARY
Glucose-Capillary: 119 mg/dL — ABNORMAL HIGH (ref 70–99)
Glucose-Capillary: 33 mg/dL — CL (ref 70–99)
Glucose-Capillary: 80 mg/dL (ref 70–99)

## 2012-06-28 MED ORDER — INSULIN ASPART PROT & ASPART (70-30 MIX) 100 UNIT/ML ~~LOC~~ SUSP
16.0000 [IU] | Freq: Two times a day (BID) | SUBCUTANEOUS | Status: DC
  Administered 2012-06-28: 16 [IU] via SUBCUTANEOUS

## 2012-06-28 MED ORDER — POTASSIUM CHLORIDE 20 MEQ/15ML (10%) PO LIQD
40.0000 meq | Freq: Every day | ORAL | Status: DC
  Administered 2012-06-28: 40 meq via ORAL
  Filled 2012-06-28 (×2): qty 30

## 2012-06-28 NOTE — Progress Notes (Signed)
Report called to Minerva Areola, Charity fundraiser.  Transferred to room 1519 via wheelchair.

## 2012-06-28 NOTE — Progress Notes (Addendum)
Subjective: Patient seen and examined, very drowsy, has been getting ativan prn for the panic attacks. She is now off insulin infusion and has been getting Insulin 70/30 14 units SQ BID. Random Blood glucose was elevated to 200's  Objective: Vital signs in last 24 hours: Temp:  [98.3 F (36.8 C)-99.1 F (37.3 C)] 98.4 F (36.9 C) (01/04 0800) Pulse Rate:  [110-113] 110  (01/04 0830) Resp:  [10-14] 10  (01/04 0830) BP: (141-156)/(71-77) 141/73 mmHg (01/04 0830) SpO2:  [99 %-100 %] 99 % (01/04 0830) Weight change:  Last BM Date: 06/26/12      Intake/Output from previous day: 01/03 0701 - 01/04 0700 In: 2000 [I.V.:2000] Out: 1200 [Urine:1200]     Physical Exam: Head: Normocephalic, atraumatic.  Eyes: No signs of jaundice, EOMI Nose: Mucous membranes dry.   Neck: supple,No deformities, masses, or tenderness noted. Lungs: Normal respiratory effort. B/L Clear to auscultation, no crackles or wheezes.  Heart: Regular RR. S1 and S2 normal  Abdomen: BS normoactive. Soft, Nondistended, non-tender.  Extremities: No pretibial edema, no erythema   Lab Results: Basic Metabolic Panel:  Basename 06/28/12 0348 06/27/12 1943  NA 132* 137  K 3.3* 3.6  CL 101 104  CO2 19 20  GLUCOSE 209* 105*  BUN 6 7  CREATININE 0.73 0.81  CALCIUM 8.9 9.1  MG -- --  PHOS -- --   Liver Function Tests: No results found for this basename: AST:2,ALT:2,ALKPHOS:2,BILITOT:2,PROT:2,ALBUMIN:2 in the last 72 hours No results found for this basename: LIPASE:2,AMYLASE:2 in the last 72 hours No results found for this basename: AMMONIA:2 in the last 72 hours CBC:  Basename 06/27/12 0325 06/26/12 0515  WBC 10.8* 13.8*  NEUTROABS -- --  HGB 10.1* 10.3*  HCT 33.1* 33.4*  MCV 74.7* 74.2*  PLT 395 407*   Cardiac Enzymes: No results found for this basename: CKTOTAL:3,CKMB:3,CKMBINDEX:3,TROPONINI:3 in the last 72 hours BNP: No results found for this basename: PROBNP:3 in the last 72 hours D-Dimer: No  results found for this basename: DDIMER:2 in the last 72 hours CBG:  Basename 06/28/12 0801 06/27/12 2134 06/27/12 1541 06/27/12 1142 06/27/12 0736 06/26/12 1959  GLUCAP 243* 121* 100* 165* 330* 125*    Drugs of Abuse     Component Value Date/Time   LABOPIA NONE DETECTED 03/29/2010 0630   COCAINSCRNUR NONE DETECTED 03/29/2010 0630   LABBENZ NONE DETECTED 03/29/2010 0630   AMPHETMU NONE DETECTED 03/29/2010 0630   THCU NONE DETECTED 03/29/2010 0630   LABBARB  Value: NONE DETECTED        DRUG SCREEN FOR MEDICAL PURPOSES ONLY.  IF CONFIRMATION IS NEEDED FOR ANY PURPOSE, NOTIFY LAB WITHIN 5 DAYS.        LOWEST DETECTABLE LIMITS FOR URINE DRUG SCREEN Drug Class       Cutoff (ng/mL) Amphetamine      1000 Barbiturate      200 Benzodiazepine   200 Tricyclics       300 Opiates          300 Cocaine          300 THC              50 03/29/2010 0630    Alcohol Level: No results found for this basename: ETH:2 in the last 72 hours Urinalysis:  Basename 06/25/12 1124  COLORURINE YELLOW  LABSPEC 1.025  PHURINE 5.5  GLUCOSEU >1000*  HGBUR TRACE*  BILIRUBINUR NEGATIVE  KETONESUR >80*  PROTEINUR 30*  UROBILINOGEN 0.2  NITRITE NEGATIVE  LEUKOCYTESUR TRACE*  Recent Results (from the past 240 hour(s))  URINE CULTURE     Status: Normal   Collection Time   06/25/12 11:25 AM      Component Value Range Status Comment   Specimen Description URINE, CLEAN CATCH   Final    Special Requests NONE   Final    Culture  Setup Time 06/25/2012 22:40   Final    Colony Count NO GROWTH   Final    Culture NO GROWTH   Final    Report Status 06/27/2012 FINAL   Final   MRSA PCR SCREENING     Status: Normal   Collection Time   06/25/12 11:31 AM      Component Value Range Status Comment   MRSA by PCR NEGATIVE  NEGATIVE Final     Studies/Results: No results found.  Medications: Scheduled Meds:    . insulin aspart  0-5 Units Subcutaneous QHS  . insulin aspart  0-9 Units Subcutaneous TID WC  . insulin aspart  protamine-insulin aspart  16 Units Subcutaneous BID WC  . potassium chloride  40 mEq Oral Daily  . simethicone  80 mg Oral QID   Continuous Infusions:    . dextrose 5 % and 0.45% NaCl 75 mL/hr (06/28/12 0414)   PRN Meds:.dextrose, dextrose, LORazepam, morphine injection, ondansetron, promethazine  Assessment/Plan:  Active Problems:  DIABETES MELLITUS, TYPE I  DKA (diabetic ketoacidoses)  Nausea and vomiting  Diabetic ketoacidosis Resolved Co2 still mildly elevated Will follow BMP today  Panic disorder Will d/c IV ativan Ativan 1 mg Po q 8 hr prn  Diabetes mellitus Will change the insulin 70/30 to 16 units sq bid SSI  ? UTI Urine culture shows no growth Will d/c rocephin  Diarrhea Resolved C diff PCR could not be collected as she had no BM for past 8 hrs Will d/c c diff precautions  Hypokalemia Replace potassium  DKA has now resolved, patient is eating. Will d/c the iv fluids to kvo and transfer her out to medical floor   LOS: 3 days   [facility] Pager: [phone] 06/28/2012, 9:42 AM

## 2012-06-29 DIAGNOSIS — I10 Essential (primary) hypertension: Secondary | ICD-10-CM

## 2012-06-29 LAB — LIPASE, BLOOD: Lipase: 25 U/L (ref 11–59)

## 2012-06-29 LAB — CBC
MCV: 71.8 fL — ABNORMAL LOW (ref 78.0–100.0)
Platelets: 352 10*3/uL (ref 150–400)
RBC: 4.39 MIL/uL (ref 3.87–5.11)
RDW: 15.7 % — ABNORMAL HIGH (ref 11.5–15.5)
WBC: 4.9 10*3/uL (ref 4.0–10.5)

## 2012-06-29 LAB — MAGNESIUM: Magnesium: 1.7 mg/dL (ref 1.5–2.5)

## 2012-06-29 LAB — HEPATIC FUNCTION PANEL
Albumin: 2.8 g/dL — ABNORMAL LOW (ref 3.5–5.2)
Alkaline Phosphatase: 105 U/L (ref 39–117)
Indirect Bilirubin: 0.5 mg/dL (ref 0.3–0.9)
Total Bilirubin: 0.6 mg/dL (ref 0.3–1.2)
Total Protein: 6.7 g/dL (ref 6.0–8.3)

## 2012-06-29 LAB — GLUCOSE, CAPILLARY
Glucose-Capillary: 217 mg/dL — ABNORMAL HIGH (ref 70–99)
Glucose-Capillary: 168 mg/dL — ABNORMAL HIGH (ref 70–99)
Glucose-Capillary: 287 mg/dL — ABNORMAL HIGH (ref 70–99)

## 2012-06-29 LAB — BASIC METABOLIC PANEL
BUN: 5 mg/dL — ABNORMAL LOW (ref 6–23)
Calcium: 8.7 mg/dL (ref 8.4–10.5)
GFR calc Af Amer: >90 mL/min (ref >90)
GFR calc non Af Amer: >90 mL/min (ref >90)
Potassium: 3.2 mEq/L — ABNORMAL LOW (ref 3.5–5.1)
Sodium: 135 mEq/L (ref 135–145)

## 2012-06-29 MED ORDER — POTASSIUM CHLORIDE CRYS ER 20 MEQ PO TBCR
40.0000 meq | EXTENDED_RELEASE_TABLET | Freq: Two times a day (BID) | ORAL | Status: DC
  Administered 2012-06-29 – 2012-06-30 (×3): 40 meq via ORAL
  Filled 2012-06-29 (×4): qty 2

## 2012-06-29 MED ORDER — LISINOPRIL 10 MG PO TABS
10.0000 mg | ORAL_TABLET | Freq: Every morning | ORAL | Status: DC
  Administered 2012-06-29: 10 mg via ORAL
  Filled 2012-06-29 (×2): qty 1

## 2012-06-29 MED ORDER — ENOXAPARIN SODIUM 40 MG/0.4ML ~~LOC~~ SOLN
40.0000 mg | SUBCUTANEOUS | Status: DC
  Filled 2012-06-29: qty 0.4

## 2012-06-29 MED ORDER — POTASSIUM CHLORIDE CRYS ER 20 MEQ PO TBCR
40.0000 meq | EXTENDED_RELEASE_TABLET | Freq: Every day | ORAL | Status: DC
  Administered 2012-06-29: 40 meq via ORAL
  Filled 2012-06-29: qty 2

## 2012-06-29 MED ORDER — ENOXAPARIN SODIUM 40 MG/0.4ML ~~LOC~~ SOLN
40.0000 mg | Freq: Once | SUBCUTANEOUS | Status: DC
  Filled 2012-06-29: qty 0.4

## 2012-06-29 MED ORDER — INSULIN ASPART PROT & ASPART (70-30 MIX) 100 UNIT/ML ~~LOC~~ SUSP
14.0000 [IU] | Freq: Two times a day (BID) | SUBCUTANEOUS | Status: DC
  Filled 2012-06-29: qty 3

## 2012-06-29 MED ORDER — POTASSIUM CHLORIDE 10 MEQ/100ML IV SOLN
10.0000 meq | INTRAVENOUS | Status: AC
  Filled 2012-06-29 (×3): qty 100

## 2012-06-29 NOTE — Progress Notes (Signed)
Hypoglycemic Event  CBG: 33  Treatment: 15 GM carbohydrate snack  Symptoms: Sweaty  Follow-up CBG: Time:2121 CBG Result:80  Possible Reasons for Event: Unknown  Comments/MD notified:on call physician notified    [name]  Remember to initiate Hypoglycemia Order Set & complete

## 2012-06-29 NOTE — Progress Notes (Signed)
3 runs of potassium ordered, started the the first bag at 75 cc/hr, patient C/O IV line burining, decrease it down to 50ccbut still even down to 25cc/hr with the same C/O burning  Sensation. Notified Dr, Sharl Ma who stated he will change it from IV to PO potassium. Patient also refused Lovenox MD aware.

## 2012-06-29 NOTE — Progress Notes (Addendum)
Subjective: Patient seen and examined, him and he has been lethargic. Patient has been getting Ativan 1 mg every 8 hours when necessary for the panic attacks. Also complains of nausea and 1 episode of vomiting this morning. Her blood glucose has been living alone now due to poor by mouth intake. Patient has had prior cholecystectomy, she still complains of abdominal pain. Abdominal x-ray was obtained which did not show any significant abnormality  Objective: Vital signs in last 24 hours: Temp:  [97.3 F (36.3 C)-98.6 F (37 C)] 97.9 F (36.6 C) (01/05 0534) Pulse Rate:  [70-106] 105  (01/05 0746) Resp:  [16-18] 18  (01/05 0534) BP: (122-173)/(67-83) 146/79 mmHg (01/05 0746) SpO2:  [98 %-100 %] 100 % (01/05 0534) Weight:  [60.8 kg (134 lb 0.6 oz)] 60.8 kg (134 lb 0.6 oz) (01/04 1115) Weight change:  Last BM Date: 06/24/12      Intake/Output from previous day: 01/04 0701 - 01/05 0700 In: 10 [I.V.:10] Out: 625 [Urine:625]     Physical Exam: Head: Normocephalic, atraumatic.  Eyes: No signs of jaundice, EOMI Nose: Mucous membranes dry.   Neck: supple,No deformities, masses, or tenderness noted. Lungs: Normal respiratory effort. B/L Clear to auscultation, no crackles or wheezes.  Heart: Regular RR. S1 and S2 normal  Abdomen: BS normoactive. Soft, Nondistended, non-tender.  Extremities: No pretibial edema, no erythema   Lab Results: Basic Metabolic Panel:  Basename 06/29/12 0738 06/28/12 0348  NA 135 132*  K 3.2* 3.3*  CL 101 101  CO2 20 19  GLUCOSE 180* 209*  BUN 5* 6  CREATININE 0.77 0.73  CALCIUM 8.7 8.9  MG -- --  PHOS -- --   CBC:  Basename 06/29/12 0738 06/27/12 0325  WBC 4.9 10.8*  NEUTROABS -- --  HGB 10.1* 10.1*  HCT 31.5* 33.1*  MCV 71.8* 74.7*  PLT 352 395   Cardiac Enzymes: No results found for this basename: CKTOTAL:3,CKMB:3,CKMBINDEX:3,TROPONINI:3 in the last 72 hours BNP: No results found for this basename: PROBNP:3 in the last 72  hours D-Dimer: No results found for this basename: DDIMER:2 in the last 72 hours CBG:  Basename 06/29/12 0745 06/29/12 0330 06/28/12 2136 06/28/12 2050 06/28/12 1651 06/28/12 1147  GLUCAP 190* 85 80 33* 191* 119*    Drugs of Abuse     Component Value Date/Time   LABOPIA NONE DETECTED 03/29/2010 0630   COCAINSCRNUR NONE DETECTED 03/29/2010 0630   LABBENZ NONE DETECTED 03/29/2010 0630   AMPHETMU NONE DETECTED 03/29/2010 0630   THCU NONE DETECTED 03/29/2010 0630   LABBARB  Value: NONE DETECTED        DRUG SCREEN FOR MEDICAL PURPOSES ONLY.  IF CONFIRMATION IS NEEDED FOR ANY PURPOSE, NOTIFY LAB WITHIN 5 DAYS.        LOWEST DETECTABLE LIMITS FOR URINE DRUG SCREEN Drug Class       Cutoff (ng/mL) Amphetamine      1000 Barbiturate      200 Benzodiazepine   200 Tricyclics       300 Opiates          300 Cocaine          300 THC              50 03/29/2010 0630    Alcohol Level: No results found for this basename: ETH:2 in the last 72 hours Urinalysis: No results found for this basename: COLORURINE:2,APPERANCEUR:2,LABSPEC:2,PHURINE:2,GLUCOSEU:2,HGBUR:2,BILIRUBINUR:2,KETONESUR:2,PROTEINUR:2,UROBILINOGEN:2,NITRITE:2,LEUKOCYTESUR:2 in the last 72 hours  Recent Results (from the past 240 hour(s))  URINE CULTURE  Status: Normal   Collection Time   06/25/12 11:25 AM      Component Value Range Status Comment   Specimen Description URINE, CLEAN CATCH   Final    Special Requests NONE   Final    Culture  Setup Time 06/25/2012 22:40   Final    Colony Count NO GROWTH   Final    Culture NO GROWTH   Final    Report Status 06/27/2012 FINAL   Final   MRSA PCR SCREENING     Status: Normal   Collection Time   06/25/12 11:31 AM      Component Value Range Status Comment   MRSA by PCR NEGATIVE  NEGATIVE Final     Studies/Results: No results found.  Medications: Scheduled Meds:    . insulin aspart  0-5 Units Subcutaneous QHS  . insulin aspart  0-9 Units Subcutaneous TID WC  . potassium chloride  40 mEq Oral  Daily  . simethicone  80 mg Oral QID   Continuous Infusions:    . dextrose 5 % and 0.45% NaCl 10 mL/hr (06/28/12 1006)   PRN Meds:.dextrose, dextrose, ondansetron, promethazine  Assessment/Plan:  Active Problems:  DIABETES MELLITUS, TYPE I  DKA (diabetic ketoacidoses)  Nausea and vomiting  Diabetic ketoacidosis Resolved  Nausea vomiting Will obtain LFTs and lipase Continue patient on clear liquid diet Continue Zofran and Phenergan when necessary  Panic disorder Will DC the Ativan at this time as patient is lethargic  Diabetes mellitus Continue NPH 7030 at 14 units twice a day and sliding scale insulin  ? UTI Urine culture shows no growth Will d/c rocephin  Diarrhea Resolved C diff PCR could not be collected as she had no BM for past 8 hrs Will d/c c diff precautions  Hypokalemia Potassium still low Will check magnesium  Hypertension Will restart lisinopril which she takes at home  DVT prophylaxis Lovenox    LOS: 4 days   [facility] Pager: [phone] 06/29/2012, 9:03 AM

## 2012-06-29 NOTE — Progress Notes (Signed)
Patient had hypoglycemic episode. CBG was 33. Patient felt clammy. Gave patient 4 oz juice with 3 packs sugar in it and peanut butter/crackers. CBG increased to 80 and patient is resting well. I will continue to monitor her condition throughout shift for signs of hypoglycemia

## 2012-06-30 DIAGNOSIS — E109 Type 1 diabetes mellitus without complications: Secondary | ICD-10-CM

## 2012-06-30 LAB — BASIC METABOLIC PANEL
BUN: 7 mg/dL (ref 6–23)
Calcium: 8.6 mg/dL (ref 8.4–10.5)
Chloride: 99 mEq/L (ref 96–112)
Creatinine, Ser: 0.92 mg/dL (ref 0.50–1.10)
GFR calc Af Amer: 86 mL/min — ABNORMAL LOW (ref >90)
GFR calc non Af Amer: 74 mL/min — ABNORMAL LOW (ref >90)

## 2012-06-30 MED ORDER — INSULIN ASPART PROT & ASPART (70-30 MIX) 100 UNIT/ML ~~LOC~~ SUSP
14.0000 [IU] | Freq: Two times a day (BID) | SUBCUTANEOUS | Status: DC
  Administered 2012-06-30: 14 [IU] via SUBCUTANEOUS
  Filled 2012-06-30: qty 3

## 2012-06-30 MED ORDER — INSULIN ASPART 100 UNIT/ML ~~LOC~~ SOLN: 0.0000 [IU] | Freq: Three times a day (TID) | SUBCUTANEOUS | Status: DC

## 2012-06-30 MED ORDER — FUROSEMIDE 20 MG PO TABS: 20.0000 mg | ORAL_TABLET | Freq: Every day | ORAL | Status: DC | PRN

## 2012-06-30 NOTE — Care Management Note (Addendum)
    Page 1 of 1   06/30/2012     12:16:04 PM   CARE MANAGEMENT NOTE 06/30/2012  Patient:  Reece,Norine   Account Number:  [id number]  Date Initiated:  06/26/2012  Documentation initiated by:  DAVIS,RHONDA  Subjective/Objective Assessment:   pt with hx of dka presented with several days of vomiting     Action/Plan:   required iv insulin due to high c02 and glucose,   Anticipated DC Date:  06/30/2012   Anticipated DC Plan:  HOME/SELF CARE         Choice offered to / List presented to:             Status of service:  Completed, signed off Medicare Important Message given?  NA - LOS <3 / Initial given by admissions (If response is "NO", the following Medicare IM given date fields will be blank) Date Medicare IM given:   Date Additional Medicare IM given:    Discharge Disposition:  HOME/SELF CARE  Per UR Regulation:  Reviewed for med. necessity/level of care/duration of stay  If discussed at Long Length of Stay Meetings, dates discussed:    Comments:  06/30/12 Clarabell Matsuoka RN,BSN NCM [phone] PROVIDED PATIENT W/ENDOCRINOLOGIST'S IN [facility].NO FURTHER D/C NEEDS.HAS PCP,PHARMACY,GLUCOMETER.  [id number] Earlene Plater, RN, BSN, CCM: CHART REVIEWED AND UPDATED.  Next chart review due on [id number]. NO DISCHARGE NEEDS PRESENT AT THIS TIME. CASE MANAGEMENT [id number]

## 2012-06-30 NOTE — Discharge Summary (Addendum)
Physician Discharge Summary  [name] AVW:[id number] DOB: 1966-05-29 DOA: 06/25/2012  PCP: Lonia Blood, MD  Admit date: 06/25/2012 Discharge date: 06/30/2012  Time spent: 50 minutes  Recommendations for Outpatient Follow-up:  1. Follow Dr. Mikeal Hawthorne  in 1 week  Discharge Diagnoses:  Active Problems:  DIABETES MELLITUS, TYPE I  DKA (diabetic ketoacidoses)  Nausea and vomiting   Discharge Condition: Stable  Diet recommendation: Diabetic diet  Filed Weights   06/26/12 0342 06/27/12 0700 06/28/12 1115  Weight: 58.9 kg (129 lb 13.6 oz) 59.8 kg (131 lb 13.4 oz) 60.8 kg (134 lb 0.6 oz)    History of present illness:  47 year old female with history of diabetes mellitus, hypertension, GERD, anxiety came to the hospital with nausea vomiting. Patient says that she has been feeling sick for past few days also complained of cloudy urine, nausea and vomiting. She took last dose of insulin yesterday morning. Patient takes insulin 70/30 14 units in the morning and foot at bedtime. She denies chest pain or shortness of breath.   Hospital Course:  DKA Patient came to the hospital with diabetic ketoacidosis with high anion gap of more than 25, patient was started on IV fluids and IV insulin as per DKA protocol. After 2 days of staying in the ICU patient improved and the ketoacidosis resolved. Patient was able to eat by mouth and was started back on the insulin 70/30 14 units twice a day.  Panic disorder Patient had panic attacks in the hospital and she required lorazepam, patient at present time as outpatient. I recommend her to consider taking SSRI on long-term basis for prevention of panic attacks. At this time patient is not having any more panic attacks and will be discharged home  Hypertension Patient blood pressure was stable in the hospital. She was started back on the lisinopril, at this time the blood pressure is a stable, I have asked her to check the blood pressure daily and follow with  her primary care physician to adjust the dose of lisinopril if needed.  Questionable UTI Patient had abnormal UA. Urine culture was negative. Patient was empirically treated with IV Rocephin which was later discontinued after the urine cultures came back as negative  Leukocytosis Patient had leukocytosis at the time of admission, most likely stress induced and it is resolved at this time   Patient takes high-dose Lasix 80 mg when necessary for fluid in the ankles, I have changed that to 20 mg by mouth daily when necessary for fluid in ankles as it might lower her blood pressure and affect her kidney functions. She'll be given the prescription for 20 mg Lasix by mouth daily when necessary  Patient's last blood glucose is 228 in the hospital  Procedures:  None  Consultations:  None  Discharge Exam: Filed Vitals:   06/29/12 1300 06/29/12 2016 06/30/12 0617 06/30/12 0943  BP: 133/77 100/57 125/99 94/58  Pulse: 95 100 99   Temp: 97.8 F (36.6 C) 98.9 F (37.2 C) 98.8 F (37.1 C)   TempSrc: Oral Oral Oral   Resp: 15 16 18    Height:      Weight:      SpO2: 100% 100% 98%     General: Appearing no acute distress Cardiovascular: S1-S2 regular Respiratory: Clear bilaterally Extremities: No edema  Discharge Instructions  Discharge Orders    Future Orders Please Complete By Expires   Diet - low sodium heart healthy      Diet Carb Modified      Increase activity  slowly          Medication List     As of 06/30/2012 10:56 AM    TAKE these medications         furosemide 20 MG tablet   Commonly known as: LASIX   Take 1 tablet (20 mg total) by mouth daily as needed (Fluid in ankles). For fluid retention.      insulin aspart 100 UNIT/ML injection   Commonly known as: novoLOG   Inject 0-9 Units into the skin 3 (three) times daily with meals.      insulin aspart protamine-insulin aspart (70-30) 100 UNIT/ML injection   Commonly known as: NOVOLOG 70/30   Inject 14 Units  into the skin 2 (two) times daily with a meal.      lisinopril 10 MG tablet   Commonly known as: PRINIVIL,ZESTRIL   Take 10 mg by mouth every morning.      LORazepam 1 MG tablet   Commonly known as: ATIVAN   Take 1 mg by mouth every 8 (eight) hours as needed. For anxiety.      metoCLOPramide 5 MG tablet   Commonly known as: REGLAN   Take 5 mg by mouth 3 (three) times daily as needed. For nausea.      pantoprazole 40 MG tablet   Commonly known as: PROTONIX   Take 1 tablet (40 mg total) by mouth daily.      pregabalin 150 MG capsule   Commonly known as: LYRICA   Take 150 mg by mouth 2 (two) times daily.      promethazine 25 MG tablet   Commonly known as: PHENERGAN   Take 25 mg by mouth every 6 (six) hours as needed. For nausea.           Follow-up Information    Follow up with [facility], MD. In 1 week.   Contact information:   [address] [place] [address]           The results of significant diagnostics from this hospitalization (including imaging, microbiology, ancillary and laboratory) are listed below for reference.    Significant Diagnostic Studies: Dg Chest 2 View  06/25/2012  *RADIOLOGY REPORT*  Clinical Data: Shortness of breath.  Nausea and vomiting.  CHEST - 2 VIEW  Comparison: 03/29/2010  Findings: There is peribronchial thickening consistent with bronchitis.  There are no infiltrates or effusions.  Heart size and vascularity are normal.  No significant osseous abnormality.  IMPRESSION: Bronchitic changes.   Original Report Authenticated By: Francene Boyers, M.D.    Dg Cervical Spine 2 Or 3 Views  06/25/2012  *RADIOLOGY REPORT*  Clinical Data: Neck stiffness, mass  CERVICAL SPINE - 2-3 VIEW  Comparison: None.  Findings: The cervical spine as well visualized to the cervicothoracic junction on the swimmer's lateral view.  No evidence of acute fracture or malalignment.  Mild reversal of the typical cervical lordosis.  Mild degenerative change with  uncovertebral joint hypertrophy at C4-C5, and C5-C6.  No prevertebral soft tissue swelling.  IMPRESSION:  1.  No acute fracture or malalignment. 2.  Mild reversal of the normal cervical lordosis 3.  Cervical spondylitic changes at C4-C5 and C5-C6.   Original Report Authenticated By: Malachy Moan, M.D.    Dg Abd 1 View  06/25/2012  *RADIOLOGY REPORT*  Clinical Data: Abdominal distension, pain  ABDOMEN - 1 VIEW  Comparison: Prior abdominal radiographs 04/02/2012  Findings: Mild gaseous distension of the stomach and transverse colon.  Gas is also noted in the  ascending, and descending colons. Lung bases are clear.  Surgical clips in the right upper quadrant suggest prior cholecystectomy.  No acute osseous abnormality.  IMPRESSION:  Nonspecific but nonobstructed bowel gas pattern with gaseous distension of the stomach and colon to the level of the distal descending colon without significant dilation.   Original Report Authenticated By: Malachy Moan, M.D.     Microbiology: Recent Results (from the past 240 hour(s))  URINE CULTURE     Status: Normal   Collection Time   06/25/12 11:25 AM      Component Value Range Status Comment   Specimen Description URINE, CLEAN CATCH   Final    Special Requests NONE   Final    Culture  Setup Time 06/25/2012 22:40   Final    Colony Count NO GROWTH   Final    Culture NO GROWTH   Final    Report Status 06/27/2012 FINAL   Final   MRSA PCR SCREENING     Status: Normal   Collection Time   06/25/12 11:31 AM      Component Value Range Status Comment   MRSA by PCR NEGATIVE  NEGATIVE Final      Labs: Basic Metabolic Panel:  Lab 06/30/12 4540 06/29/12 0738 06/28/12 0348 06/27/12 1943 06/27/12 0926  NA 130* 135 132* 137 132*  K 4.5 3.2* 3.3* 3.6 3.8  CL 99 101 101 104 101  CO2 21 20 19 20  13*  GLUCOSE 416* 180* 209* 105* 295*  BUN 7 5* 6 7 10   CREATININE 0.92 0.77 0.73 0.81 0.81  CALCIUM 8.6 8.7 8.9 9.1 8.9  MG -- 1.7 -- -- --  PHOS -- -- -- -- --   Liver  Function Tests:  Lab 06/29/12 0738  AST 14  ALT 8  ALKPHOS 105  BILITOT 0.6  PROT 6.7  ALBUMIN 2.8*    Lab 06/29/12 0738 06/25/12 0700  LIPASE 25 13  AMYLASE -- --   No results found for this basename: AMMONIA:5 in the last 168 hours CBC:  Lab 06/29/12 0738 06/27/12 0325 06/26/12 0515 06/25/12 1140 06/25/12 0700  WBC 4.9 10.8* 13.8* 14.1* 13.8*  NEUTROABS -- -- -- -- 11.7*  HGB 10.1* 10.1* 10.3* 10.8* 11.0*  HCT 31.5* 33.1* 33.4* 34.9* 35.5*  MCV 71.8* 74.7* 74.2* 74.4* 74.0*  PLT 352 395 407* 390 449*   Cardiac Enzymes: No results found for this basename: CKTOTAL:5,CKMB:5,CKMBINDEX:5,TROPONINI:5 in the last 168 hours BNP: BNP (last 3 results) No results found for this basename: PROBNP:3 in the last 8760 hours CBG:  Lab 06/30/12 0949 06/30/12 0708 06/29/12 2015 06/29/12 1623 06/29/12 1119  GLUCAP 228* 439* 287* 168* 217*       Signed:  Jaquawn Saffran S  [facility] 06/30/2012, 10:56 AM

## 2012-06-30 NOTE — Progress Notes (Signed)
Pt's blood glucose with labs drawn this a.m. Was 416.  Checked pt's CBG and it was 439.  Notified MD [place] and dayshift RN received new orders for insulin. Dayshift RN will continue to monitor pt.

## 2012-06-30 NOTE — Progress Notes (Signed)
Patient discharged home, discharge instructions given and explained to patient and she verbalized understanding, denies any pain/distress. Skin intact, no wound. Transported to the car via wheelchair by staff; accompanied home by boyfriend.

## 2012-06-30 NOTE — Plan of Care (Signed)
Problem: Discharge Progression Outcomes Goal: Obtain signed CBG meter Rx form Outcome: Not Applicable Date Met:  06/30/12 Patient already have CBG meter at home.

## 2012-08-04 ENCOUNTER — Ambulatory Visit (INDEPENDENT_AMBULATORY_CARE_PROVIDER_SITE_OTHER): Payer: 59 | Admitting: Emergency Medicine

## 2012-08-04 VITALS — BP 137/82 | HR 102 | Temp 98.6°F | Resp 17 | Ht 61.5 in | Wt 135.0 lb

## 2012-08-04 DIAGNOSIS — N3000 Acute cystitis without hematuria: Secondary | ICD-10-CM

## 2012-08-04 DIAGNOSIS — E1065 Type 1 diabetes mellitus with hyperglycemia: Secondary | ICD-10-CM

## 2012-08-04 DIAGNOSIS — Z9119 Patient's noncompliance with other medical treatment and regimen: Secondary | ICD-10-CM

## 2012-08-04 DIAGNOSIS — IMO0001 Reserved for inherently not codable concepts without codable children: Secondary | ICD-10-CM

## 2012-08-04 DIAGNOSIS — R300 Dysuria: Secondary | ICD-10-CM

## 2012-08-04 DIAGNOSIS — Z91199 Patient's noncompliance with other medical treatment and regimen due to unspecified reason: Secondary | ICD-10-CM

## 2012-08-04 LAB — POCT URINALYSIS DIPSTICK
Bilirubin, UA: NEGATIVE
Nitrite, UA: POSITIVE
Spec Grav, UA: 1.025
Urobilinogen, UA: 0.2
pH, UA: 6.5

## 2012-08-04 LAB — POCT UA - MICROSCOPIC ONLY
Bacteria, U Microscopic: 2
Casts, Ur, LPF, POC: NEGATIVE
Yeast, UA: NEGATIVE

## 2012-08-04 MED ORDER — PHENAZOPYRIDINE HCL 200 MG PO TABS: 200.0000 mg | ORAL_TABLET | Freq: Three times a day (TID) | ORAL | Status: DC | PRN

## 2012-08-04 MED ORDER — CIPROFLOXACIN HCL 500 MG PO TABS: 500.0000 mg | ORAL_TABLET | Freq: Two times a day (BID) | ORAL | Status: DC

## 2012-08-04 NOTE — Progress Notes (Signed)
Reviewed and agree.

## 2012-08-04 NOTE — Progress Notes (Signed)
[facility] [address], [place] [address] [phone]  Date:  08/04/2012   Name:  [name]   DOB:  02-08-1966   MRN:  [id number]  PCP:  Lonia Blood, MD    Chief Complaint: Urinary Tract Infection   History of Present Illness:  [name] is a 47 y.o. very pleasant female patient who presents with the following:  Ill with dysuria, urgency and frequency.  Started Friday.  No fever or chills, nausea or vomiting.  Sugars running high.  No stool change.  No rash.  No back pain.  No abdominal pain or GYN symptoms.  Patient Active Problem List  Diagnosis  . DIABETES MELLITUS, TYPE I  . ALLERGIC RHINITIS  . UTI'S, HX OF  . DKA (diabetic ketoacidoses)  . Dehydration  . Abdominal pain  . Microcytic anemia  . Non compliance w medication regimen  . Nausea and vomiting  . Hypertension  . Pancreatitis  . Constipation  . Dyspnea on exertion    Past Medical History  Diagnosis Date  . Diabetes mellitus     diagnosed age 65  . Hypertension   . Cataract   . Anxiety   . Anemia   . GERD (gastroesophageal reflux disease)   . Constipation 04/03/2012  . Asthma     Past Surgical History  Procedure Laterality Date  . Retinal detachment surgery    . Posterior vitrectomy and membrane peel-left eye  09/28/2002  . Posterior vitrectomy and membrane peel-right eye  03/16/2002  . Eye surgery    . Gailstones      History  Substance Use Topics  . Smoking status: Never Smoker   . Smokeless tobacco: Never Used  . Alcohol Use: No    Family History  Problem Relation Age of Onset  . Cystic fibrosis Mother   . Hypertension Father   . Diabetes Brother   . Hypertension Maternal Grandmother     Allergies  Allergen Reactions  . Anesthetics, Amide Nausea And Vomiting    Medication list has been reviewed and updated.  Current Outpatient Prescriptions on File Prior to Visit  Medication Sig Dispense Refill  . insulin aspart (NOVOLOG) 100 UNIT/ML  injection Inject 0-9 Units into the skin 3 (three) times daily with meals.  1 vial  3  . insulin aspart protamine-insulin aspart (NOVOLOG 70/30) (70-30) 100 UNIT/ML injection Inject 14 Units into the skin 2 (two) times daily with a meal.      . lisinopril (PRINIVIL,ZESTRIL) 10 MG tablet Take 10 mg by mouth every morning.       Marland Kitchen LORazepam (ATIVAN) 1 MG tablet Take 1 mg by mouth every 8 (eight) hours as needed. For anxiety.      . metoCLOPramide (REGLAN) 5 MG tablet Take 5 mg by mouth 3 (three) times daily as needed. For nausea.      . pregabalin (LYRICA) 150 MG capsule Take 150 mg by mouth 2 (two) times daily.      . promethazine (PHENERGAN) 25 MG tablet Take 25 mg by mouth every 6 (six) hours as needed. For nausea.      . furosemide (LASIX) 20 MG tablet Take 1 tablet (20 mg total) by mouth daily as needed (Fluid in ankles). For fluid retention.  30 tablet  2   No current facility-administered medications on file prior to visit.    Review of Systems:  As per HPI, otherwise negative.    Physical Examination: Filed Vitals:   08/04/12 0941  BP:  137/82  Pulse: 102  Temp: 98.6 F (37 C)  Resp: 17   Filed Vitals:   08/04/12 0941  Height: 5' 1.5" (1.562 m)  Weight: 135 lb (61.236 kg)   Body mass index is 25.1 kg/(m^2). Ideal Body Weight: Weight in (lb) to have BMI = 25: 134.2  GEN: WDWN, NAD, Non-toxic, A & O x 3 HEENT: Atraumatic, Normocephalic. Neck supple. No masses, No LAD. Ears and Nose: No external deformity. CV: RRR, No M/G/R. No JVD. No thrill. No extra heart sounds. PULM: CTA B, no wheezes, crackles, rhonchi. No retractions. No resp. distress. No accessory muscle use. ABD: S, NT, ND, +BS. No rebound. No HSM. EXTR: No c/c/e NEURO Normal gait.  PSYCH: Normally interactive. Conversant. Not depressed or anxious appearing.  Calm demeanor.    Assessment and Plan: Acute cystitis Poorly controlled IDDM Noncompliance Follow up with endocrinology cipro Pyridium Increase  liquids  Carmelina Dane, MD   Results for orders placed in visit on 08/04/12  POCT UA - MICROSCOPIC ONLY      Result Value Range   WBC, Ur, HPF, POC 15-20     RBC, urine, microscopic 4-6     Bacteria, U Microscopic 2 PLUS     Mucus, UA MEGATOVE     Epithelial cells, urine per micros 0-2     Crystals, Ur, HPF, POC NEGATIVE     Casts, Ur, LPF, POC NEGATIVE     Yeast, UA NEGATIVE    POCT URINALYSIS DIPSTICK      Result Value Range   Color, UA ORANGE     Clarity, UA TURBID     Glucose, UA 250     Bilirubin, UA NEGATIVE     Ketones, UA 15+     Spec Grav, UA 1.025     Blood, UA MODERATE     pH, UA 6.5     Protein, UA >=300     Urobilinogen, UA 0.2     Nitrite, UA POSITIVE     Leukocytes, UA small (1+)

## 2012-08-04 NOTE — Patient Instructions (Addendum)
Urinary Tract Infection Urinary tract infections (UTIs) can develop anywhere along your urinary tract. Your urinary tract is your body's drainage system for removing wastes and extra water. Your urinary tract includes two kidneys, two ureters, a bladder, and a urethra. Your kidneys are a pair of bean-shaped organs. Each kidney is about the size of your fist. They are located below your ribs, one on each side of your spine. CAUSES Infections are caused by microbes, which are microscopic organisms, including fungi, viruses, and bacteria. These organisms are so small that they can only be seen through a microscope. Bacteria are the microbes that most commonly cause UTIs. SYMPTOMS  Symptoms of UTIs may vary by age and gender of the patient and by the location of the infection. Symptoms in young women typically include a frequent and intense urge to urinate and a painful, burning feeling in the bladder or urethra during urination. Older women and men are more likely to be tired, shaky, and weak and have muscle aches and abdominal pain. A fever may mean the infection is in your kidneys. Other symptoms of a kidney infection include pain in your back or sides below the ribs, nausea, and vomiting. DIAGNOSIS To diagnose a UTI, your caregiver will ask you about your symptoms. Your caregiver also will ask to provide a urine sample. The urine sample will be tested for bacteria and white blood cells. White blood cells are made by your body to help fight infection. TREATMENT  Typically, UTIs can be treated with medication. Because most UTIs are caused by a bacterial infection, they usually can be treated with the use of antibiotics. The choice of antibiotic and length of treatment depend on your symptoms and the type of bacteria causing your infection. HOME CARE INSTRUCTIONS  If you were prescribed antibiotics, take them exactly as your caregiver instructs you. Finish the medication even if you feel better after you  have only taken some of the medication.  Drink enough water and fluids to keep your urine clear or pale yellow.  Avoid caffeine, tea, and carbonated beverages. They tend to irritate your bladder.  Empty your bladder often. Avoid holding urine for long periods of time.  Empty your bladder before and after sexual intercourse.  After a bowel movement, women should cleanse from front to back. Use each tissue only once. SEEK MEDICAL CARE IF:   You have back pain.  You develop a fever.  Your symptoms do not begin to resolve within 3 days. SEEK IMMEDIATE MEDICAL CARE IF:   You have severe back pain or lower abdominal pain.  You develop chills.  You have nausea or vomiting.  You have continued burning or discomfort with urination. MAKE SURE YOU:   Understand these instructions.  Will watch your condition.  Will get help right away if you are not doing well or get worse. Document Released: 03/21/2005 Document Revised: 12/11/2011 Document Reviewed: 07/20/2011 [employer] Patient Information 2013 [employer].  

## 2012-08-07 ENCOUNTER — Emergency Department (HOSPITAL_COMMUNITY)
Admission: EM | Admit: 2012-08-07 | Discharge: 2012-08-07 | Disposition: A | Discharge status: Home / Self Care | Payer: 59 | Attending: Emergency Medicine | Admitting: Emergency Medicine

## 2012-08-07 ENCOUNTER — Encounter (HOSPITAL_COMMUNITY): Payer: Self-pay | Admitting: Emergency Medicine

## 2012-08-07 ENCOUNTER — Emergency Department (HOSPITAL_COMMUNITY): Payer: 59

## 2012-08-07 DIAGNOSIS — J45909 Unspecified asthma, uncomplicated: Secondary | ICD-10-CM | POA: Insufficient documentation

## 2012-08-07 DIAGNOSIS — E1169 Type 2 diabetes mellitus with other specified complication: Secondary | ICD-10-CM | POA: Insufficient documentation

## 2012-08-07 DIAGNOSIS — Z862 Personal history of diseases of the blood and blood-forming organs and certain disorders involving the immune mechanism: Secondary | ICD-10-CM | POA: Insufficient documentation

## 2012-08-07 DIAGNOSIS — R112 Nausea with vomiting, unspecified: Secondary | ICD-10-CM | POA: Insufficient documentation

## 2012-08-07 DIAGNOSIS — F411 Generalized anxiety disorder: Secondary | ICD-10-CM | POA: Insufficient documentation

## 2012-08-07 DIAGNOSIS — K219 Gastro-esophageal reflux disease without esophagitis: Secondary | ICD-10-CM | POA: Insufficient documentation

## 2012-08-07 DIAGNOSIS — R739 Hyperglycemia, unspecified: Secondary | ICD-10-CM

## 2012-08-07 DIAGNOSIS — Z79899 Other long term (current) drug therapy: Secondary | ICD-10-CM | POA: Insufficient documentation

## 2012-08-07 DIAGNOSIS — Z794 Long term (current) use of insulin: Secondary | ICD-10-CM | POA: Insufficient documentation

## 2012-08-07 DIAGNOSIS — R5381 Other malaise: Secondary | ICD-10-CM | POA: Insufficient documentation

## 2012-08-07 DIAGNOSIS — Z8669 Personal history of other diseases of the nervous system and sense organs: Secondary | ICD-10-CM | POA: Insufficient documentation

## 2012-08-07 DIAGNOSIS — R109 Unspecified abdominal pain: Secondary | ICD-10-CM | POA: Insufficient documentation

## 2012-08-07 DIAGNOSIS — IMO0001 Reserved for inherently not codable concepts without codable children: Secondary | ICD-10-CM | POA: Insufficient documentation

## 2012-08-07 DIAGNOSIS — I10 Essential (primary) hypertension: Secondary | ICD-10-CM | POA: Insufficient documentation

## 2012-08-07 LAB — COMPREHENSIVE METABOLIC PANEL
Albumin: 3.5 g/dL (ref 3.5–5.2)
Alkaline Phosphatase: 117 U/L (ref 39–117)
BUN: 17 mg/dL (ref 6–23)
Calcium: 9.4 mg/dL (ref 8.4–10.5)
Creatinine, Ser: 0.86 mg/dL (ref 0.50–1.10)
GFR calc Af Amer: >90 mL/min (ref >90)
Glucose, Bld: 109 mg/dL — ABNORMAL HIGH (ref 70–99)
Potassium: 3.7 mEq/L (ref 3.5–5.1)
Total Protein: 8.4 g/dL — ABNORMAL HIGH (ref 6.0–8.3)

## 2012-08-07 LAB — CBC WITH DIFFERENTIAL/PLATELET
Basophils Relative: 1 % (ref 0–1)
Eosinophils Absolute: 0.1 10*3/uL (ref 0.0–0.7)
Eosinophils Relative: 2 % (ref 0–5)
Hemoglobin: 11.3 g/dL — ABNORMAL LOW (ref 12.0–15.0)
Lymphs Abs: 1.5 10*3/uL (ref 0.7–4.0)
MCH: 23.2 pg — ABNORMAL LOW (ref 26.0–34.0)
MCHC: 30.6 g/dL (ref 30.0–36.0)
MCV: 75.6 fL — ABNORMAL LOW (ref 78.0–100.0)
Monocytes Relative: 9 % (ref 3–12)
Neutrophils Relative %: 63 % (ref 43–77)

## 2012-08-07 LAB — URINALYSIS, ROUTINE W REFLEX MICROSCOPIC
Leukocytes, UA: NEGATIVE
Nitrite: NEGATIVE
Specific Gravity, Urine: 1.03 (ref 1.005–1.030)
Urobilinogen, UA: 1 mg/dL (ref 0.0–1.0)
pH: 5.5 (ref 5.0–8.0)

## 2012-08-07 LAB — URINE MICROSCOPIC-ADD ON

## 2012-08-07 LAB — LIPASE, BLOOD: Lipase: 13 U/L (ref 11–59)

## 2012-08-07 MED ORDER — SODIUM CHLORIDE 0.9 % IV SOLN: INTRAVENOUS | Status: DC

## 2012-08-07 MED ORDER — ONDANSETRON 8 MG PO TBDP: 8.0000 mg | ORAL_TABLET | Freq: Three times a day (TID) | ORAL | Status: DC | PRN

## 2012-08-07 MED ORDER — METOCLOPRAMIDE HCL 10 MG PO TABS: 10.0000 mg | ORAL_TABLET | Freq: Four times a day (QID) | ORAL | Status: DC

## 2012-08-07 MED ORDER — ONDANSETRON HCL 4 MG/2ML IJ SOLN
4.0000 mg | Freq: Once | INTRAMUSCULAR | Status: AC
  Administered 2012-08-07: 4 mg via INTRAVENOUS
  Filled 2012-08-07: qty 2

## 2012-08-07 MED ORDER — SODIUM CHLORIDE 0.9 % IV BOLUS (SEPSIS)
1000.0000 mL | Freq: Once | INTRAVENOUS | Status: AC
  Administered 2012-08-07: 1000 mL via INTRAVENOUS

## 2012-08-07 MED ORDER — METOCLOPRAMIDE HCL 5 MG/ML IJ SOLN
10.0000 mg | Freq: Once | INTRAMUSCULAR | Status: AC
  Administered 2012-08-07: 10 mg via INTRAVENOUS
  Filled 2012-08-07: qty 2

## 2012-08-07 NOTE — ED Notes (Signed)
[id number]<AV> Expected date:<BR> Expected time:<BR> Means of arrival:<BR> Comments:<BR> Ems/ diabetic n/v

## 2012-08-07 NOTE — ED Notes (Signed)
Pt here via ems for c/o upper abd and n/v x 3 days

## 2012-08-07 NOTE — ED Provider Notes (Signed)
History     CSN: [id number]  Arrival date & time 08/07/12  1144   First MD Initiated Contact with Patient 08/07/12 1147      Chief Complaint  Patient presents with  . Abdominal Pain  . Emesis    (Consider location/radiation/quality/duration/timing/severity/associated sxs/prior treatment) HPI [name] is a 47 y.o. female with hx of diabetes and gastroparesis, presents to ED with complaint of nausea, vomiting for 4 days. States when symptoms began, she went to an UC, where they treated her for UTI with antibiotics. States unable to take medications now because cannot keep anything down. Denies fever, chills. States feels weak. States if tried to drink or eat anything, she vomits. No diarrhea. Upper abdominal pain. Tried phenergan tabs but states cannot keep those down either. Denies urinary symptoms. Denies back of flank pain. No URI symptoms.   Past Medical History  Diagnosis Date  . Diabetes mellitus     diagnosed age 57  . Hypertension   . Cataract   . Anxiety   . Anemia   . GERD (gastroesophageal reflux disease)   . Constipation 04/03/2012  . Asthma     Past Surgical History  Procedure Laterality Date  . Retinal detachment surgery    . Posterior vitrectomy and membrane peel-left eye  09/28/2002  . Posterior vitrectomy and membrane peel-right eye  03/16/2002  . Eye surgery    . Gailstones      Family History  Problem Relation Age of Onset  . Cystic fibrosis Mother   . Hypertension Father   . Diabetes Brother   . Hypertension Maternal Grandmother     History  Substance Use Topics  . Smoking status: Never Smoker   . Smokeless tobacco: Never Used  . Alcohol Use: No    OB History   Grav Para Term Preterm Abortions TAB SAB Ect Mult Living                  Review of Systems  Constitutional: Positive for fatigue. Negative for fever and chills.  HENT: Negative for neck pain and neck stiffness.   Respiratory: Negative.   Cardiovascular: Negative.    Gastrointestinal: Positive for nausea, vomiting and abdominal pain. Negative for diarrhea, constipation and blood in stool.  Genitourinary: Negative for dysuria, urgency and flank pain.  Musculoskeletal: Positive for myalgias. Negative for arthralgias.  Skin: Negative.   Neurological: Positive for weakness.    Allergies  Anesthetics, amide  Home Medications   Current Outpatient Rx  Name  Route  Sig  Dispense  Refill  . ciprofloxacin (CIPRO) 500 MG tablet   Oral   Take 1 tablet (500 mg total) by mouth 2 (two) times daily.   20 tablet   0   . furosemide (LASIX) 20 MG tablet   Oral   Take 1 tablet (20 mg total) by mouth daily as needed (Fluid in ankles). For fluid retention.   30 tablet   2   . insulin aspart (NOVOLOG) 100 UNIT/ML injection   Subcutaneous   Inject 0-9 Units into the skin 3 (three) times daily with meals.   1 vial   3   . insulin aspart protamine-insulin aspart (NOVOLOG 70/30) (70-30) 100 UNIT/ML injection   Subcutaneous   Inject 14 Units into the skin 2 (two) times daily with a meal.         . lisinopril (PRINIVIL,ZESTRIL) 10 MG tablet   Oral   Take 10 mg by mouth every morning.          Marland Kitchen  LORazepam (ATIVAN) 1 MG tablet   Oral   Take 1 mg by mouth every 8 (eight) hours as needed. For anxiety.         . metoCLOPramide (REGLAN) 5 MG tablet   Oral   Take 5 mg by mouth 3 (three) times daily as needed. For nausea.         . phenazopyridine (PYRIDIUM) 200 MG tablet   Oral   Take 1 tablet (200 mg total) by mouth 3 (three) times daily as needed for pain.   6 tablet   0   . pregabalin (LYRICA) 150 MG capsule   Oral   Take 150 mg by mouth 2 (two) times daily.         . promethazine (PHENERGAN) 25 MG tablet   Oral   Take 25 mg by mouth every 6 (six) hours as needed. For nausea.           BP 113/61  Pulse 116  Temp(Src) 98 F (36.7 C) (Oral)  Resp 18  SpO2 98%  LMP 07/29/2012  Physical Exam  Nursing note and vitals  reviewed. Constitutional: She appears well-developed and well-nourished.  Weak appearing, wispering  Eyes: Conjunctivae are normal.  Neck: Neck supple.  Cardiovascular: Regular rhythm and normal heart sounds.   tachycardia  Pulmonary/Chest: Effort normal and breath sounds normal. No respiratory distress. She has no wheezes. She has no rales.  Abdominal: Soft. Bowel sounds are normal. She exhibits no distension. There is tenderness. There is no rebound.  LUQ, epigastric, RUQ tenderness. Suprapubic tenderness. No cva tenderness.  Musculoskeletal: She exhibits no edema.  Neurological: She is alert.  Skin: Skin is warm and dry. No rash noted. No erythema.    ED Course  Procedures (including critical care time)  Pt with nausea, vomiting, diffuse abdominal pain, no guarding. Doubt surgical abdomen. Will get labs, acute abd series.   Results for orders placed during the hospital encounter of 08/07/12  CBC WITH DIFFERENTIAL      Result Value Range   WBC 5.9  4.0 - 10.5 K/uL   RBC 4.88  3.87 - 5.11 MIL/uL   Hemoglobin 11.3 (*) 12.0 - 15.0 g/dL   HCT 16.1  09.6 - 04.5 %   MCV 75.6 (*) 78.0 - 100.0 fL   MCH 23.2 (*) 26.0 - 34.0 pg   MCHC 30.6  30.0 - 36.0 g/dL   RDW 40.9 (*) 81.1 - 91.4 %   Platelets 504 (*) 150 - 400 K/uL   Neutrophils Relative 63  43 - 77 %   Neutro Abs 3.7  1.7 - 7.7 K/uL   Lymphocytes Relative 25  12 - 46 %   Lymphs Abs 1.5  0.7 - 4.0 K/uL   Monocytes Relative 9  3 - 12 %   Monocytes Absolute 0.6  0.1 - 1.0 K/uL   Eosinophils Relative 2  0 - 5 %   Eosinophils Absolute 0.1  0.0 - 0.7 K/uL   Basophils Relative 1  0 - 1 %   Basophils Absolute 0.1  0.0 - 0.1 K/uL  COMPREHENSIVE METABOLIC PANEL      Result Value Range   Sodium 138  135 - 145 mEq/L   Potassium 3.7  3.5 - 5.1 mEq/L   Chloride 103  96 - 112 mEq/L   CO2 23  19 - 32 mEq/L   Glucose, Bld 109 (*) 70 - 99 mg/dL   BUN 17  6 - 23 mg/dL   Creatinine, Ser 7.82  0.50 - 1.10 mg/dL   Calcium 9.4  8.4 - 09.8  mg/dL   Total Protein 8.4 (*) 6.0 - 8.3 g/dL   Albumin 3.5  3.5 - 5.2 g/dL   AST 29  0 - 37 U/L   ALT 18  0 - 35 U/L   Alkaline Phosphatase 117  39 - 117 U/L   Total Bilirubin 0.7  0.3 - 1.2 mg/dL   GFR calc non Af Amer 80 (*) >90 mL/min   GFR calc Af Amer >90  >90 mL/min  LIPASE, BLOOD      Result Value Range   Lipase 13  11 - 59 U/L  URINALYSIS, ROUTINE W REFLEX MICROSCOPIC      Result Value Range   Color, Urine YELLOW  YELLOW   APPearance CLOUDY (*) CLEAR   Specific Gravity, Urine 1.030  1.005 - 1.030   pH 5.5  5.0 - 8.0   Glucose, UA >1000 (*) NEGATIVE mg/dL   Hgb urine dipstick TRACE (*) NEGATIVE   Bilirubin Urine SMALL (*) NEGATIVE   Ketones, ur >80 (*) NEGATIVE mg/dL   Protein, ur 119 (*) NEGATIVE mg/dL   Urobilinogen, UA 1.0  0.0 - 1.0 mg/dL   Nitrite NEGATIVE  NEGATIVE   Leukocytes, UA NEGATIVE  NEGATIVE  URINE MICROSCOPIC-ADD ON      Result Value Range   Squamous Epithelial / LPF RARE  RARE   WBC, UA 3-6  <3 WBC/hpf   RBC / HPF 0-2  <3 RBC/hpf   Bacteria, UA FEW (*) RARE   Dg Abd 2 Views  08/07/2012  *RADIOLOGY REPORT*  Clinical Data: Abdominal pain and vomiting  ABDOMEN - 2 VIEW  Comparison: 06/25/2012  Findings: Cholecystectomy clips noted within the right upper quadrant of the abdomen.  Gas and stool noted within the colon up to the rectum.  No dilated loop of small bowel or fluid level noted.  IMPRESSION:  1.  Nonobstructive bowel gas pattern.   Original Report Authenticated By: Signa Kell, M.D.       1. Nausea & vomiting   2. Hyperglycemia       MDM  Labs unremarkable. Anion gap 12. She does have >80 ketones in urine. Rehydrated with 3L of NS. Nausea controlled with zofran and reglan IV. Abdominal pain resolved. Abdomen re examined, and is non tender at this time. Pt tolerating PO fluids in ED. She wants to try to go home. Will d/c home with at home reglan and zofran. Follow up with PCP. Pt also asked to go see a gi specialist, will give a referral, but  explained, this may have to be done through her PCP.    Filed Vitals:   08/07/12 1201 08/07/12 1539 08/07/12 1714  BP: 113/61 122/53 114/56  Pulse: 116  98  Temp: 98 F (36.7 C) 98.2 F (36.8 C) 98.1 F (36.7 C)  TempSrc: Oral Oral Oral  Resp: 18 18   SpO2: 98%  95%        Lottie Mussel, PA 08/07/12 1957

## 2012-08-08 LAB — URINE CULTURE

## 2012-08-08 NOTE — ED Provider Notes (Signed)
Medical screening examination/treatment/procedure(s) were performed by non-physician practitioner and as supervising physician I was immediately available for consultation/collaboration.   Loren Racer, MD 08/08/12 [phone]

## 2012-11-05 ENCOUNTER — Encounter (HOSPITAL_COMMUNITY): Payer: Self-pay | Admitting: General Practice

## 2012-11-05 ENCOUNTER — Inpatient Hospital Stay (HOSPITAL_COMMUNITY): Payer: 59

## 2012-11-05 ENCOUNTER — Observation Stay (HOSPITAL_COMMUNITY)
Admission: AD | Admit: 2012-11-05 | Discharge: 2012-11-07 | Disposition: A | Discharge status: Home / Self Care | Payer: 59 | Source: Ambulatory Visit | Attending: Internal Medicine | Admitting: Internal Medicine

## 2012-11-05 DIAGNOSIS — K859 Acute pancreatitis without necrosis or infection, unspecified: Secondary | ICD-10-CM

## 2012-11-05 DIAGNOSIS — K5900 Constipation, unspecified: Secondary | ICD-10-CM

## 2012-11-05 DIAGNOSIS — R0609 Other forms of dyspnea: Secondary | ICD-10-CM | POA: Insufficient documentation

## 2012-11-05 DIAGNOSIS — Z87448 Personal history of other diseases of urinary system: Secondary | ICD-10-CM

## 2012-11-05 DIAGNOSIS — E860 Dehydration: Secondary | ICD-10-CM

## 2012-11-05 DIAGNOSIS — E1049 Type 1 diabetes mellitus with other diabetic neurological complication: Secondary | ICD-10-CM | POA: Insufficient documentation

## 2012-11-05 DIAGNOSIS — E878 Other disorders of electrolyte and fluid balance, not elsewhere classified: Secondary | ICD-10-CM | POA: Insufficient documentation

## 2012-11-05 DIAGNOSIS — Z9114 Patient's other noncompliance with medication regimen: Secondary | ICD-10-CM

## 2012-11-05 DIAGNOSIS — J309 Allergic rhinitis, unspecified: Secondary | ICD-10-CM

## 2012-11-05 DIAGNOSIS — E1142 Type 2 diabetes mellitus with diabetic polyneuropathy: Secondary | ICD-10-CM | POA: Insufficient documentation

## 2012-11-05 DIAGNOSIS — R609 Edema, unspecified: Secondary | ICD-10-CM | POA: Insufficient documentation

## 2012-11-05 DIAGNOSIS — E871 Hypo-osmolality and hyponatremia: Secondary | ICD-10-CM | POA: Insufficient documentation

## 2012-11-05 DIAGNOSIS — D509 Iron deficiency anemia, unspecified: Secondary | ICD-10-CM

## 2012-11-05 DIAGNOSIS — R109 Unspecified abdominal pain: Secondary | ICD-10-CM

## 2012-11-05 DIAGNOSIS — E1110 Type 2 diabetes mellitus with ketoacidosis without coma: Secondary | ICD-10-CM

## 2012-11-05 DIAGNOSIS — R112 Nausea with vomiting, unspecified: Secondary | ICD-10-CM

## 2012-11-05 DIAGNOSIS — Z794 Long term (current) use of insulin: Secondary | ICD-10-CM | POA: Insufficient documentation

## 2012-11-05 DIAGNOSIS — R0600 Dyspnea, unspecified: Secondary | ICD-10-CM

## 2012-11-05 DIAGNOSIS — Z79899 Other long term (current) drug therapy: Secondary | ICD-10-CM | POA: Insufficient documentation

## 2012-11-05 DIAGNOSIS — I10 Essential (primary) hypertension: Secondary | ICD-10-CM | POA: Insufficient documentation

## 2012-11-05 DIAGNOSIS — E109 Type 1 diabetes mellitus without complications: Secondary | ICD-10-CM

## 2012-11-05 DIAGNOSIS — M7989 Other specified soft tissue disorders: Principal | ICD-10-CM | POA: Insufficient documentation

## 2012-11-05 DIAGNOSIS — R0989 Other specified symptoms and signs involving the circulatory and respiratory systems: Secondary | ICD-10-CM | POA: Insufficient documentation

## 2012-11-05 HISTORY — DX: Other complications of anesthesia, initial encounter: T88.59XA

## 2012-11-05 HISTORY — DX: Nausea with vomiting, unspecified: R11.2

## 2012-11-05 HISTORY — DX: Other specified postprocedural states: Z98.890

## 2012-11-05 LAB — CBC
HCT: 33.8 % — ABNORMAL LOW (ref 36.0–46.0)
MCV: 72.2 fL — ABNORMAL LOW (ref 78.0–100.0)
RDW: 15.6 % — ABNORMAL HIGH (ref 11.5–15.5)
WBC: 5.8 10*3/uL (ref 4.0–10.5)

## 2012-11-05 LAB — HEMOGLOBIN A1C: Hgb A1c MFr Bld: 10.4 % — ABNORMAL HIGH (ref <5.7)

## 2012-11-05 LAB — URINALYSIS, ROUTINE W REFLEX MICROSCOPIC
Glucose, UA: >1000 mg/dL — AB
Leukocytes, UA: NEGATIVE
Protein, ur: NEGATIVE mg/dL
Urobilinogen, UA: 0.2 mg/dL (ref 0.0–1.0)

## 2012-11-05 LAB — COMPREHENSIVE METABOLIC PANEL
Alkaline Phosphatase: 117 U/L (ref 39–117)
BUN: 11 mg/dL (ref 6–23)
Calcium: 9.8 mg/dL (ref 8.4–10.5)
GFR calc Af Amer: 80 mL/min — ABNORMAL LOW (ref >90)
GFR calc non Af Amer: 69 mL/min — ABNORMAL LOW (ref >90)
Glucose, Bld: 74 mg/dL (ref 70–99)
Total Protein: 8 g/dL (ref 6.0–8.3)

## 2012-11-05 LAB — GLUCOSE, CAPILLARY: Glucose-Capillary: 74 mg/dL (ref 70–99)

## 2012-11-05 LAB — URINE MICROSCOPIC-ADD ON

## 2012-11-05 MED ORDER — FERROUS SULFATE 325 (65 FE) MG PO TABS
325.0000 mg | ORAL_TABLET | Freq: Two times a day (BID) | ORAL | Status: DC
  Administered 2012-11-05 – 2012-11-07 (×4): 325 mg via ORAL
  Filled 2012-11-05 (×5): qty 1

## 2012-11-05 MED ORDER — SODIUM CHLORIDE 0.9 % IJ SOLN: 3.0000 mL | INTRAMUSCULAR | Status: DC | PRN

## 2012-11-05 MED ORDER — INSULIN ASPART PROT & ASPART (70-30 MIX) 100 UNIT/ML ~~LOC~~ SUSP
14.0000 [IU] | Freq: Two times a day (BID) | SUBCUTANEOUS | Status: DC
  Filled 2012-11-05: qty 10

## 2012-11-05 MED ORDER — ASPIRIN EC 81 MG PO TBEC
81.0000 mg | DELAYED_RELEASE_TABLET | Freq: Every day | ORAL | Status: DC
  Administered 2012-11-05 – 2012-11-07 (×3): 81 mg via ORAL
  Filled 2012-11-05 (×3): qty 1

## 2012-11-05 MED ORDER — ONDANSETRON HCL 4 MG PO TABS: 4.0000 mg | ORAL_TABLET | Freq: Four times a day (QID) | ORAL | Status: DC | PRN

## 2012-11-05 MED ORDER — INSULIN ASPART 100 UNIT/ML ~~LOC~~ SOLN: 0.0000 [IU] | Freq: Three times a day (TID) | SUBCUTANEOUS | Status: DC

## 2012-11-05 MED ORDER — BISACODYL 5 MG PO TBEC
5.0000 mg | DELAYED_RELEASE_TABLET | Freq: Every day | ORAL | Status: DC | PRN
  Administered 2012-11-05: 5 mg via ORAL
  Filled 2012-11-05: qty 1

## 2012-11-05 MED ORDER — FUROSEMIDE 10 MG/ML IJ SOLN
40.0000 mg | Freq: Two times a day (BID) | INTRAMUSCULAR | Status: DC
  Administered 2012-11-05: 40 mg via INTRAVENOUS
  Filled 2012-11-05 (×3): qty 4

## 2012-11-05 MED ORDER — INSULIN ASPART 100 UNIT/ML ~~LOC~~ SOLN
0.0000 [IU] | Freq: Every day | SUBCUTANEOUS | Status: DC
  Administered 2012-11-05: 2 [IU] via SUBCUTANEOUS

## 2012-11-05 MED ORDER — PREGABALIN 50 MG PO CAPS
150.0000 mg | ORAL_CAPSULE | Freq: Two times a day (BID) | ORAL | Status: DC
  Administered 2012-11-05 – 2012-11-07 (×4): 150 mg via ORAL
  Filled 2012-11-05 (×4): qty 1

## 2012-11-05 MED ORDER — SODIUM CHLORIDE 0.9 % IJ SOLN
3.0000 mL | Freq: Two times a day (BID) | INTRAMUSCULAR | Status: DC
  Administered 2012-11-05 – 2012-11-06 (×3): 3 mL via INTRAVENOUS

## 2012-11-05 MED ORDER — ACETAMINOPHEN 650 MG RE SUPP: 650.0000 mg | Freq: Four times a day (QID) | RECTAL | Status: DC | PRN

## 2012-11-05 MED ORDER — ONDANSETRON HCL 4 MG/2ML IJ SOLN: 4.0000 mg | Freq: Four times a day (QID) | INTRAMUSCULAR | Status: DC | PRN

## 2012-11-05 MED ORDER — SPIRONOLACTONE 50 MG PO TABS
50.0000 mg | ORAL_TABLET | Freq: Every day | ORAL | Status: DC
  Administered 2012-11-05: 50 mg via ORAL
  Filled 2012-11-05 (×2): qty 1

## 2012-11-05 MED ORDER — ACETAMINOPHEN 325 MG PO TABS: 650.0000 mg | ORAL_TABLET | Freq: Four times a day (QID) | ORAL | Status: DC | PRN

## 2012-11-05 MED ORDER — ENOXAPARIN SODIUM 40 MG/0.4ML ~~LOC~~ SOLN
40.0000 mg | SUBCUTANEOUS | Status: DC
  Administered 2012-11-05 – 2012-11-06 (×2): 40 mg via SUBCUTANEOUS
  Filled 2012-11-05 (×3): qty 0.4

## 2012-11-05 MED ORDER — SODIUM CHLORIDE 0.9 % IJ SOLN
3.0000 mL | Freq: Two times a day (BID) | INTRAMUSCULAR | Status: DC
  Administered 2012-11-07: 3 mL via INTRAVENOUS

## 2012-11-05 MED ORDER — SIMVASTATIN 10 MG PO TABS
10.0000 mg | ORAL_TABLET | Freq: Every day | ORAL | Status: DC
  Administered 2012-11-05 – 2012-11-06 (×2): 10 mg via ORAL
  Filled 2012-11-05 (×3): qty 1

## 2012-11-05 MED ORDER — SODIUM CHLORIDE 0.9 % IV SOLN: 250.0000 mL | INTRAVENOUS | Status: DC | PRN

## 2012-11-05 NOTE — H&P (Signed)
[facility] History and Physical  Doaa Kendzierski ZOX:[id number] DOB: 1965/08/25 DOA: 11/05/2012  Referring physician: Dr. Mikeal Hawthorne PCP: Lonia Blood, MD  Specialists:  Chief Complaint:  Worsening leg swelling  HPI: [name] is a 47 y.o. female with history of type 1 diabetes, hypertension, anemia who presents with above complaints. She states that she has had leg swelling which has worsened in the past week. She saw her PCP 2 days ago, and her medications adjusted to include Aldactone but the leg swelling continued to worsen and and so she was directly admitted by her PCP today on followup. She admits to dyspnea on exertion for the past 2 weeks. She uses 3 pillows but states that this is long-standing and unchanged. She denies chest pain, PND.   Review of Systems: The patient denies anorexia, fever, weight loss,, vision loss, decreased hearing, hoarseness, chest pain, syncope, balance deficits, hemoptysis, abdominal pain, melena, hematochezia, severe , hematuria, incontinence, genital sores, muscle weakness, suspicious skin lesions, transient blindness, difficulty walking, depression, unusual weight change, abnormal bleeding, enlarged lymph nodes, angioedema, and breast masses.    Past Medical History  Diagnosis Date  . Diabetes mellitus     diagnosed age 58  . Hypertension   . Cataract   . Anxiety   . Anemia   . GERD (gastroesophageal reflux disease)   . Constipation 04/03/2012  . Complication of anesthesia   . PONV (postoperative nausea and vomiting)   . Asthma     IN CHILDHOOD  . Neuromuscular disorder     Diabetic neuropathy   Past Surgical History  Procedure Laterality Date  . Retinal detachment surgery    . Posterior vitrectomy and membrane peel-left eye  09/28/2002  . Posterior vitrectomy and membrane peel-right eye  03/16/2002  . Eye surgery    . Gailstones     Social History:  reports that she has never smoked. She has never used smokeless tobacco. She reports that   drinks alcohol. She reports that she does not use illicit drugs. where does patient live--home Can patient participate in ADLs-yes  Allergies  Allergen Reactions  . Anesthetics, Amide Nausea And Vomiting    Family History  Problem Relation Age of Onset  . Cystic fibrosis Mother   . Hypertension Father   . Diabetes Brother   . Hypertension Maternal Grandmother    Prior to Admission medications   Medication Sig Start Date End Date Taking? Authorizing Provider  ciprofloxacin (CIPRO) 250 MG tablet Take 250 mg by mouth 2 (two) times daily.   Yes Historical Provider, MD  ferrous sulfate 325 (65 FE) MG tablet Take 325 mg by mouth 2 (two) times daily.   Yes Historical Provider, MD  furosemide (LASIX) 80 MG tablet Take 80 mg by mouth daily.   Yes Historical Provider, MD  insulin aspart (NOVOLOG) 100 UNIT/ML injection Inject 0-9 Units into the skin 3 (three) times daily with meals. Sliding scale 06/30/12  Yes Meredeth Ide, MD  insulin aspart protamine-insulin aspart (NOVOLOG 70/30) (70-30) 100 UNIT/ML injection Inject 14 Units into the skin 2 (two) times daily with a meal. 04/05/12  Yes Rodolph Bong, MD  lisinopril (PRINIVIL,ZESTRIL) 10 MG tablet Take 10 mg by mouth every morning.    Yes Historical Provider, MD  pravastatin (PRAVACHOL) 20 MG tablet Take 20 mg by mouth at bedtime.   Yes Historical Provider, MD  pregabalin (LYRICA) 150 MG capsule Take 150 mg by mouth 2 (two) times daily.   Yes Historical Provider, MD  promethazine (PHENERGAN) 25  MG tablet Take 25 mg by mouth every 6 (six) hours as needed. For nausea. 03/28/12  Yes Dorothea Ogle, MD  spironolactone (ALDACTONE) 50 MG tablet Take 50 mg by mouth daily.   Yes Historical Provider, MD   Physical Exam: Filed Vitals:   11/05/12 1551  BP: 146/80  Pulse: 94  Temp: 98.5 F (36.9 C)  TempSrc: Oral  Resp: 18  Height: 5\' 2"  (1.575 m)  Weight: 66.543 kg (146 lb 11.2 oz)  SpO2: 98%    Constitutional: Vital signs reviewed.  Patient  is a well-developed and well-nourished in no acute distress and cooperative with exam. Alert and oriented x3.  Head: Normocephalic and atraumatic Mouth: no erythema or exudates, MMM Eyes: PERRL, EOMI, conjunctivae normal, No scleral icterus.  Neck: Supple, Trachea midline normal ROM, No JVD, mass, thyromegaly, or carotid bruit present.  Cardiovascular: RRR, S1 normal, S2 normal, no MRG, pulses symmetric and intact bilaterally Pulmonary/Chest: CTAB, no wheezes, FEW basilarrales, or rhonchi Abdominal: Soft. Non-tender, non-distended, bowel sounds are normal, no masses, organomegaly, or guarding present.  GU: no CVA tenderness Extremities: +2-3 edema Neurological: A&O x3, Strength is normal and symmetric bilaterally, cranial nerve II-XII are grossly intact, no focal motor deficit, sensory intact to light touch bilaterally.  Skin: Warm, dry and intact. No rash, cyanosis, or clubbing.  Psychiatric: Normal mood and affect. speech and behavior is normal. Judgment and thought content normal. Cognition and memory are normal.    Labs on Admission:  Basic Metabolic Panel: No results found for this basename: NA, K, CL, CO2, GLUCOSE, BUN, CREATININE, CALCIUM, MG, PHOS,  in the last 168 hours Liver Function Tests: No results found for this basename: AST, ALT, ALKPHOS, BILITOT, PROT, ALBUMIN,  in the last 168 hours No results found for this basename: LIPASE, AMYLASE,  in the last 168 hours No results found for this basename: AMMONIA,  in the last 168 hours CBC: No results found for this basename: WBC, NEUTROABS, HGB, HCT, MCV, PLT,  in the last 168 hours Cardiac Enzymes: No results found for this basename: CKTOTAL, CKMB, CKMBINDEX, TROPONINI,  in the last 168 hours  BNP (last 3 results) No results found for this basename: PROBNP,  in the last 8760 hours CBG:  Recent Labs Lab 11/05/12 1632  GLUCAP 74    Radiological Exams on Admission: No results found.    Assessment/Plan Active  Problems:  DOE (dyspnea on exertion)/probable acute CHF -As discussed above, will obtain BNP, chest x-ray, 2-D echo cardiac enzymes EKG and follow. -Also start on iv Lasix for diuresis, follow and further recommendations pending above studies   Peripheral edema -Workup for probable CHF as above, also obtain that Cmet evaluate renal and liver function -Diuresis with IV Lasix as above   DIABETES MELLITUS, TYPE I -Continue 70/30, monitor Accu-Cheks and cover with sliding scale. -Also obtain A1c follow and adjust meds as appropriate Hypertension -Will hold off ACE inhibitor for now pending creatinine, monitor blood pressures and treat accordingly.  History of microcytic anemia  -Continue supplemental iron, obtain CBC and follow   Code Status:full Family Communication: family at bedside Disposition Plan: admit to tele  Time spent: >49mins  Kela Millin [facility] Pager [phone]  If 7PM-7AM, please contact night-coverage [email or web address] Password [facility] 11/05/2012, 5:37 PM

## 2012-11-06 DIAGNOSIS — R109 Unspecified abdominal pain: Secondary | ICD-10-CM

## 2012-11-06 DIAGNOSIS — R609 Edema, unspecified: Secondary | ICD-10-CM

## 2012-11-06 LAB — BASIC METABOLIC PANEL
BUN: 14 mg/dL (ref 6–23)
CO2: 19 mEq/L (ref 19–32)
Chloride: 97 mEq/L (ref 96–112)
Glucose, Bld: 494 mg/dL — ABNORMAL HIGH (ref 70–99)
Potassium: 5 mEq/L (ref 3.5–5.1)

## 2012-11-06 LAB — FOLATE: Folate: >20 ng/mL

## 2012-11-06 LAB — CBC
HCT: 29.6 % — ABNORMAL LOW (ref 36.0–46.0)
Hemoglobin: 9 g/dL — ABNORMAL LOW (ref 12.0–15.0)
MCH: 22 pg — ABNORMAL LOW (ref 26.0–34.0)
MCHC: 30.4 g/dL (ref 30.0–36.0)

## 2012-11-06 LAB — TROPONIN I: Troponin I: <0.3 ng/mL (ref <0.30)

## 2012-11-06 LAB — IRON AND TIBC
Iron: 45 ug/dL (ref 42–135)
TIBC: 398 ug/dL (ref 250–470)

## 2012-11-06 LAB — URINE CULTURE: Colony Count: NO GROWTH

## 2012-11-06 LAB — GLUCOSE, CAPILLARY
Glucose-Capillary: 439 mg/dL — ABNORMAL HIGH (ref 70–99)
Glucose-Capillary: 274 mg/dL — ABNORMAL HIGH (ref 70–99)
Glucose-Capillary: 228 mg/dL — ABNORMAL HIGH (ref 70–99)

## 2012-11-06 LAB — FERRITIN: Ferritin: 3 ng/mL — ABNORMAL LOW (ref 10–291)

## 2012-11-06 MED ORDER — INSULIN ASPART PROT & ASPART (70-30 MIX) 100 UNIT/ML ~~LOC~~ SUSP: 30.0000 [IU] | Freq: Two times a day (BID) | SUBCUTANEOUS | Status: DC

## 2012-11-06 MED ORDER — INSULIN ASPART PROT & ASPART (70-30 MIX) 100 UNIT/ML ~~LOC~~ SUSP
30.0000 [IU] | Freq: Every day | SUBCUTANEOUS | Status: DC
  Administered 2012-11-06 – 2012-11-07 (×2): 30 [IU] via SUBCUTANEOUS
  Filled 2012-11-06: qty 10

## 2012-11-06 MED ORDER — INSULIN ASPART PROT & ASPART (70-30 MIX) 100 UNIT/ML ~~LOC~~ SUSP
10.0000 [IU] | Freq: Every day | SUBCUTANEOUS | Status: DC
  Administered 2012-11-06: 10 [IU] via SUBCUTANEOUS

## 2012-11-06 NOTE — Progress Notes (Signed)
Utilization review completed.  

## 2012-11-06 NOTE — Progress Notes (Addendum)
[facility] PROGRESS NOTE  Assessment/Plan:  Peripheral edema/ DOE (dyspnea on exertion): - Echo pending, developing hyponatremia and hypochloremia, BNP 158. - D/c lasix and spirinolactone. - no lower extreity edema on Physical exam. - doppler pending.  Hypertension: - Bp stable.  Microcytic anemia: - ferritin of 11. Due to iron def anemia ( menstruating female) - ferrous sulfate TID.  DIABETES MELLITUS, TYPE I: - BG significantly high. - resume home meds.    Code Status: full Family Communication: none  Disposition Plan: home today   Consultants:  none  Procedures:  Echo 5.15.2014  Lower extremity doppler 5.15.2014  Antibiotics:  None  HPI/Subjective: She has no complains.  Objective: Filed Vitals:   11/05/12 1551 11/05/12 1804 11/05/12 2100 11/06/12 0500  BP: 146/80 135/82 122/67 149/78  Pulse: 94 92 93 98  Temp: 98.5 F (36.9 C)  98.2 F (36.8 C) 98.5 F (36.9 C)  TempSrc: Oral     Resp: 18  18 18   Height: 5\' 2"  (1.575 m)     Weight: 66.543 kg (146 lb 11.2 oz)   64.774 kg (142 lb 12.8 oz)  SpO2: 98%  97% 97%    Intake/Output Summary (Last 24 hours) at 11/06/12 1000 Last data filed at 11/06/12 0600  Gross per 24 hour  Intake      3 ml  Output   2600 ml  Net  -2597 ml   Filed Weights   11/05/12 1551 11/06/12 0500  Weight: 66.543 kg (146 lb 11.2 oz) 64.774 kg (142 lb 12.8 oz)    Exam:  General: Alert, awake, oriented x3, in no acute distress.  HEENT: No bruits, no goiter.  Heart: Regular rate and rhythm, without murmurs, rubs, gallops.  Lungs: Good air movement, clear to auscultation. Abdomen: Soft, nontender, nondistended, positive bowel sounds.  Neuro: Grossly intact, nonfocal.   Data Reviewed: Basic Metabolic Panel:  Recent Labs Lab 11/05/12 1821 11/06/12 0616  NA 136 130*  K 4.1 5.0  CL 101 97  CO2 25 19  GLUCOSE 74 494*  BUN 11 14  CREATININE 0.98 1.00  CALCIUM 9.8 9.1   Liver Function Tests:  Recent  Labs Lab 11/05/12 1821  AST 28  ALT 27  ALKPHOS 117  BILITOT 0.3  PROT 8.0  ALBUMIN 3.3*   No results found for this basename: LIPASE, AMYLASE,  in the last 168 hours No results found for this basename: AMMONIA,  in the last 168 hours CBC:  Recent Labs Lab 11/05/12 1821 11/06/12 0616  WBC 5.8 6.0  HGB 10.3* 9.0*  HCT 33.8* 29.6*  MCV 72.2* 72.4*  PLT 371 345   Cardiac Enzymes:  Recent Labs Lab 11/05/12 1821 11/05/12 2306 11/06/12 0616  TROPONINI <0.30 <0.30 <0.30   BNP (last 3 results)  Recent Labs  11/05/12 1821  PROBNP 158.2*   CBG:  Recent Labs Lab 11/05/12 1632 11/05/12 2041  GLUCAP 74 202*    No results found for this or any previous visit (from the past 240 hour(s)).   Studies: Dg Chest Port 1 View  11/05/2012   *RADIOLOGY REPORT*  Clinical Data: Evaluate for infiltrates.  PORTABLE CHEST - 1 VIEW  Comparison: 06/25/2012.  Findings: Trachea is midline.  Heart size normal.  Lungs are clear. No pleural fluid.  IMPRESSION: No acute findings.   Original Report Authenticated By: Leanna Battles, M.D.    Scheduled Meds: . aspirin EC  81 mg Oral Daily  . enoxaparin (LOVENOX) injection  40 mg Subcutaneous Q24H  . ferrous  sulfate  325 mg Oral BID  . insulin aspart protamine- aspart  10 Units Subcutaneous Q supper  . insulin aspart protamine- aspart  30 Units Subcutaneous Q breakfast  . pregabalin  150 mg Oral BID  . simvastatin  10 mg Oral q1800  . sodium chloride  3 mL Intravenous Q12H  . sodium chloride  3 mL Intravenous Q12H   Continuous Infusions:    Marinda Elk  [facility] Pager [phone]. If 8PM-8AM, please contact night-coverage at [email or web address], password [facility] 11/06/2012, 10:00 AM  LOS: 1 day

## 2012-11-06 NOTE — Progress Notes (Signed)
VASCULAR LAB PRELIMINARY  PRELIMINARY  PRELIMINARY  PRELIMINARY  Bilateral lower extremity venous duplex  completed.    Preliminary report:  Bilateral:  No evidence of DVT, superficial thrombosis, or Baker's Cyst.    Margery Szostak, RVT 11/06/2012, 5:57 PM

## 2012-11-06 NOTE — Progress Notes (Addendum)
Inpatient Diabetes Program Recommendations  AACE/ADA: New Consensus Statement on Inpatient Glycemic Control (2013)  Target Ranges:  Prepandial:   less than 140 mg/dL      Peak postprandial:   less than 180 mg/dL (1-2 hours)      Critically ill patients:  140 - 180 mg/dL   Hyperglycemia potentially due to pt not getting 70/30 doses yesterday.  Inpatient Diabetes Program Recommendations Correction (SSI): Please add some correction insulin as glucose values are high.  Can use sensitive tidwc, please. Please check another B-Met to be sure pt has normal potassium and CO2. Thank you, Lenor Coffin, RN, CNS, Diabetes Coordinator [phone])

## 2012-11-06 NOTE — Progress Notes (Signed)
  Echocardiogram 2D Echocardiogram has been performed.  Georgian Co 11/06/2012, 2:46 PM

## 2012-11-07 DIAGNOSIS — K5900 Constipation, unspecified: Secondary | ICD-10-CM

## 2012-11-07 DIAGNOSIS — R0609 Other forms of dyspnea: Secondary | ICD-10-CM

## 2012-11-07 DIAGNOSIS — R0989 Other specified symptoms and signs involving the circulatory and respiratory systems: Secondary | ICD-10-CM

## 2012-11-07 DIAGNOSIS — E109 Type 1 diabetes mellitus without complications: Secondary | ICD-10-CM

## 2012-11-07 MED ORDER — ASPIRIN 81 MG PO TBEC: 81.0000 mg | DELAYED_RELEASE_TABLET | Freq: Every day | ORAL | Status: DC

## 2012-11-07 MED ORDER — FUROSEMIDE 20 MG PO TABS: 20.0000 mg | ORAL_TABLET | Freq: Two times a day (BID) | ORAL | Status: DC

## 2012-11-07 MED ORDER — FUROSEMIDE 20 MG PO TABS: 40.0000 mg | ORAL_TABLET | Freq: Two times a day (BID) | ORAL | Status: DC

## 2012-11-07 MED ORDER — INSULIN ASPART PROT & ASPART (70-30 MIX) 100 UNIT/ML ~~LOC~~ SUSP: SUBCUTANEOUS | Status: DC

## 2012-11-07 NOTE — Progress Notes (Signed)
Pt discharged to home per MD order. Pt received and reviewed all discharge instructions and medication information including follow-up appointments and prescriptions.  Pt verbalized understanding.  Pt alert and oriented at discharge with no complaints of pain.  Pt escorted to private vehicle via wheelchair by guest services. Coates, Sangeeta Youse Elizabeth  

## 2012-11-07 NOTE — Discharge Summary (Signed)
Physician Discharge Summary  [name] HQI:[id number] DOB: 10/29/1965 DOA: 11/05/2012  PCP: Lonia Blood, MD  Admit date: 11/05/2012 Discharge date: 11/07/2012  Time spent: 35  minutes  Recommendations for Outpatient Follow-up:  1. Follow up with PCP check a b-met titrate medication for Bp as needed. 2. Follow up with PCP, check CbG's and titrate insulin as needed, Py HbgA1c was significantly elevated.  Discharge Diagnoses:  Active Problems:   DIABETES MELLITUS, TYPE I   Hypertension   Peripheral edema   DOE (dyspnea on exertion)   Discharge Condition: stable  Diet recommendation: carb modiied  Filed Weights   11/05/12 1551 11/06/12 0500 11/07/12 0500  Weight: 66.543 kg (146 lb 11.2 oz) 64.774 kg (142 lb 12.8 oz) 63.64 kg (140 lb 4.8 oz)    History of present illness:  47 y.o. [name] with history of type 1 diabetes, hypertension, anemia who presents with above complaints. She states that she has had leg swelling which has worsened in the past week. She saw her PCP 2 days ago, and her medications adjusted to include Aldactone but the leg swelling continued to worsen and and so she was directly admitted by her PCP today on followup. She admits to dyspnea on exertion for the past 2 weeks. She uses 3 pillows but states that this is long-standing and unchanged. She denies chest pain, PND.   Hospital Course:  Peripheral edema/ DOE (dyspnea on exertion):  - Echo 5.15.2014: Systolic function was normal. The estimated ejection fraction was in the range of 55% to 60%. Wall motion was normal; there were no regional wall motion abnormalities. Left ventricular diastolic function parameters were normal, developing hyponatremia and hypochloremia, BNP 158.  - spironolactone was d/c and lasix dose was lowered as the pt  Was getting intravascular depleted on current dose. - no lower extreity edema on Physical exam.  - doppler of lower extremity showed no DVT. - the cause of her lower extremity  edema remains unclear. But she coul be developing peripheral neuropathy from her poorly controlled DM.  Hypertension:  - Bp stable.   Microcytic anemia:  - ferritin of 11. Due to iron def anemia ( menstruating [name])  - ferrous sulfate TID.  - follow up with PCP.  DIABETES MELLITUS, TYPE I:  - BG significantly high. Poorly controlled at home HBgA1c 10.4. Pt has been advise to check BG 4 times a day and take to her PCP. - insulin 70/30 was increased. Pt will need aggressive counseling about her DM.  Procedures:  ECHO 5.15.2014: EF WNL, diastolic dysfunction parameters were normal.  Lower ext DVT 5.15.2014" no DVT  Consultations:  none  Discharge Exam: Filed Vitals:   11/06/12 0500 11/06/12 1348 11/06/12 2100 11/07/12 0500  BP: 149/78 117/58 108/71 120/71  Pulse: 98 93 88 87  Temp: 98.5 F (36.9 C) 98.2 F (36.8 C) 98.2 F (36.8 C) 98.4 F (36.9 C)  TempSrc:  Oral    Resp: 18 18 18 18   Height:      Weight: 64.774 kg (142 lb 12.8 oz)   63.64 kg (140 lb 4.8 oz)  SpO2: 97% 100% 99% 96%    General: A&O x3 Cardiovascular: RRR Respiratory: good air mvoement CTa B/l  Discharge Instructions  Discharge Orders   Future Orders Complete By Expires     Diet - low sodium heart healthy  As directed     Increase activity slowly  As directed         Medication List    STOP taking  these medications       ciprofloxacin 250 MG tablet  Commonly known as:  CIPRO     insulin aspart 100 UNIT/ML injection  Commonly known as:  novoLOG     spironolactone 50 MG tablet  Commonly known as:  ALDACTONE      TAKE these medications       aspirin 81 MG EC tablet  Take 1 tablet (81 mg total) by mouth daily.     ferrous sulfate 325 (65 FE) MG tablet  Take 325 mg by mouth 2 (two) times daily.     furosemide 20 MG tablet  Commonly known as:  LASIX  Take 2 tablets (40 mg total) by mouth 2 (two) times daily.     insulin aspart protamine- aspart (70-30) 100 UNIT/ML injection   Commonly known as:  NOVOLOG 70/30  45 units in the am  20 units in pm     lisinopril 10 MG tablet  Commonly known as:  PRINIVIL,ZESTRIL  Take 10 mg by mouth every morning.     pravastatin 20 MG tablet  Commonly known as:  PRAVACHOL  Take 20 mg by mouth at bedtime.     pregabalin 150 MG capsule  Commonly known as:  LYRICA  Take 150 mg by mouth 2 (two) times daily.     promethazine 25 MG tablet  Commonly known as:  PHENERGAN  Take 25 mg by mouth every 6 (six) hours as needed. For nausea.       Allergies  Allergen Reactions  . Anesthetics, Amide Nausea And Vomiting       Follow-up Information   Follow up with [facility], MD In 2 weeks. (hospital follow up)    Contact information:   [address] [place] [address]        The results of significant diagnostics from this hospitalization (including imaging, microbiology, ancillary and laboratory) are listed below for reference.    Significant Diagnostic Studies: Dg Chest Port 1 View  11/05/2012   *RADIOLOGY REPORT*  Clinical Data: Evaluate for infiltrates.  PORTABLE CHEST - 1 VIEW  Comparison: 06/25/2012.  Findings: Trachea is midline.  Heart size normal.  Lungs are clear. No pleural fluid.  IMPRESSION: No acute findings.   Original Report Authenticated By: Leanna Battles, M.D.    Microbiology: Recent Results (from the past 240 hour(s))  URINE CULTURE     Status: None   Collection Time    11/05/12  5:49 PM      Result Value Range Status   Specimen Description URINE, RANDOM   Final   Special Requests NONE   Final   Culture  Setup Time 11/05/2012 19:03   Final   Colony Count NO GROWTH   Final   Culture NO GROWTH   Final   Report Status 11/06/2012 FINAL   Final     Labs: Basic Metabolic Panel:  Recent Labs Lab 11/05/12 1821 11/06/12 0616  NA 136 130*  K 4.1 5.0  CL 101 97  CO2 25 19  GLUCOSE Yvette 494*  BUN 11 14  CREATININE 0.98 1.00  CALCIUM 9.8 9.1   Liver Function Tests:  Recent Labs Lab  11/05/12 1821  AST 28  ALT 27  ALKPHOS 117  BILITOT 0.3  PROT 8.0  ALBUMIN 3.3*   No results found for this basename: LIPASE, AMYLASE,  in the last 168 hours No results found for this basename: AMMONIA,  in the last 168 hours CBC:  Recent Labs Lab 11/05/12 1821 11/06/12 6045  WBC 5.8 6.0  HGB 10.3* 9.0*  HCT 33.8* 29.6*  MCV 72.2* 72.4*  PLT 371 345   Cardiac Enzymes:  Recent Labs Lab 11/05/12 1821 11/05/12 2306 11/06/12 0616  TROPONINI <0.30 <0.30 <0.30   BNP: BNP (last 3 results)  Recent Labs  11/05/12 1821  PROBNP 158.2*   CBG:  Recent Labs Lab 11/05/12 2041 11/06/12 0736 11/06/12 1155 11/06/12 1653 11/06/12 2052  GLUCAP 202* 439* 218* 274* 228*       Signed:  FELIZ ORTIZ, ABRAHAM  [facility] 11/07/2012, 7:27 AM

## 2012-11-25 ENCOUNTER — Ambulatory Visit (HOSPITAL_COMMUNITY): Payer: 59

## 2012-11-25 ENCOUNTER — Inpatient Hospital Stay (HOSPITAL_COMMUNITY): Payer: 59

## 2012-11-25 ENCOUNTER — Encounter (HOSPITAL_COMMUNITY): Payer: Self-pay | Admitting: Emergency Medicine

## 2012-11-25 ENCOUNTER — Emergency Department (HOSPITAL_COMMUNITY): Payer: 59

## 2012-11-25 ENCOUNTER — Inpatient Hospital Stay (HOSPITAL_COMMUNITY)
Admission: EM | Admit: 2012-11-25 | Discharge: 2012-11-28 | DRG: 638 | Disposition: A | Discharge status: Home / Self Care | Payer: 59 | Attending: Internal Medicine | Admitting: Internal Medicine

## 2012-11-25 DIAGNOSIS — G709 Myoneural disorder, unspecified: Secondary | ICD-10-CM | POA: Diagnosis present

## 2012-11-25 DIAGNOSIS — Z7982 Long term (current) use of aspirin: Secondary | ICD-10-CM

## 2012-11-25 DIAGNOSIS — D509 Iron deficiency anemia, unspecified: Secondary | ICD-10-CM

## 2012-11-25 DIAGNOSIS — R000 Tachycardia, unspecified: Secondary | ICD-10-CM | POA: Diagnosis present

## 2012-11-25 DIAGNOSIS — E1065 Type 1 diabetes mellitus with hyperglycemia: Secondary | ICD-10-CM | POA: Diagnosis present

## 2012-11-25 DIAGNOSIS — I10 Essential (primary) hypertension: Secondary | ICD-10-CM

## 2012-11-25 DIAGNOSIS — N898 Other specified noninflammatory disorders of vagina: Secondary | ICD-10-CM | POA: Diagnosis present

## 2012-11-25 DIAGNOSIS — E109 Type 1 diabetes mellitus without complications: Secondary | ICD-10-CM

## 2012-11-25 DIAGNOSIS — E876 Hypokalemia: Secondary | ICD-10-CM

## 2012-11-25 DIAGNOSIS — E1010 Type 1 diabetes mellitus with ketoacidosis without coma: Principal | ICD-10-CM | POA: Diagnosis present

## 2012-11-25 DIAGNOSIS — Z79899 Other long term (current) drug therapy: Secondary | ICD-10-CM

## 2012-11-25 DIAGNOSIS — K219 Gastro-esophageal reflux disease without esophagitis: Secondary | ICD-10-CM | POA: Diagnosis present

## 2012-11-25 DIAGNOSIS — E871 Hypo-osmolality and hyponatremia: Secondary | ICD-10-CM | POA: Diagnosis present

## 2012-11-25 DIAGNOSIS — E1142 Type 2 diabetes mellitus with diabetic polyneuropathy: Secondary | ICD-10-CM | POA: Diagnosis present

## 2012-11-25 DIAGNOSIS — R109 Unspecified abdominal pain: Secondary | ICD-10-CM

## 2012-11-25 DIAGNOSIS — R112 Nausea with vomiting, unspecified: Secondary | ICD-10-CM

## 2012-11-25 DIAGNOSIS — K3184 Gastroparesis: Secondary | ICD-10-CM | POA: Diagnosis present

## 2012-11-25 DIAGNOSIS — R197 Diarrhea, unspecified: Secondary | ICD-10-CM | POA: Diagnosis present

## 2012-11-25 DIAGNOSIS — Z794 Long term (current) use of insulin: Secondary | ICD-10-CM

## 2012-11-25 DIAGNOSIS — E1110 Type 2 diabetes mellitus with ketoacidosis without coma: Secondary | ICD-10-CM

## 2012-11-25 DIAGNOSIS — E860 Dehydration: Secondary | ICD-10-CM

## 2012-11-25 DIAGNOSIS — F411 Generalized anxiety disorder: Secondary | ICD-10-CM | POA: Diagnosis present

## 2012-11-25 DIAGNOSIS — E1049 Type 1 diabetes mellitus with other diabetic neurological complication: Secondary | ICD-10-CM | POA: Diagnosis present

## 2012-11-25 LAB — MRSA PCR SCREENING: MRSA by PCR: NEGATIVE

## 2012-11-25 LAB — BASIC METABOLIC PANEL
CO2: 7 mEq/L — CL (ref 19–32)
CO2: 11 mEq/L — ABNORMAL LOW (ref 19–32)
CO2: 12 mEq/L — ABNORMAL LOW (ref 19–32)
Calcium: 9.2 mg/dL (ref 8.4–10.5)
Chloride: 106 mEq/L (ref 96–112)
Creatinine, Ser: 1.04 mg/dL (ref 0.50–1.10)
GFR calc Af Amer: 74 mL/min — ABNORMAL LOW (ref >90)
GFR calc non Af Amer: 64 mL/min — ABNORMAL LOW (ref >90)
Glucose, Bld: 236 mg/dL — ABNORMAL HIGH (ref 70–99)
Potassium: 4.7 mEq/L (ref 3.5–5.1)
Potassium: 4.6 mEq/L (ref 3.5–5.1)
Sodium: 138 mEq/L (ref 135–145)
Sodium: 140 mEq/L (ref 135–145)
Sodium: 141 mEq/L (ref 135–145)

## 2012-11-25 LAB — CBC WITH DIFFERENTIAL/PLATELET
Basophils Absolute: 0.1 10*3/uL (ref 0.0–0.1)
Eosinophils Relative: 0 % (ref 0–5)
HCT: 36.8 % (ref 36.0–46.0)
Lymphocytes Relative: 11 % — ABNORMAL LOW (ref 12–46)
MCH: 23.2 pg — ABNORMAL LOW (ref 26.0–34.0)
MCHC: 29.6 g/dL — ABNORMAL LOW (ref 30.0–36.0)
MCV: 78.5 fL (ref 78.0–100.0)
Monocytes Absolute: 0.6 10*3/uL (ref 0.1–1.0)
RDW: 20.6 % — ABNORMAL HIGH (ref 11.5–15.5)

## 2012-11-25 LAB — URINALYSIS, ROUTINE W REFLEX MICROSCOPIC
Glucose, UA: >1000 mg/dL — AB
Leukocytes, UA: NEGATIVE
Nitrite: NEGATIVE
Protein, ur: NEGATIVE mg/dL
Urobilinogen, UA: 0.2 mg/dL (ref 0.0–1.0)

## 2012-11-25 LAB — GLUCOSE, CAPILLARY
Glucose-Capillary: 495 mg/dL — ABNORMAL HIGH (ref 70–99)
Glucose-Capillary: 536 mg/dL — ABNORMAL HIGH (ref 70–99)

## 2012-11-25 LAB — BLOOD GAS, ARTERIAL
Drawn by: 331471
pCO2 arterial: 24.5 mmHg — ABNORMAL LOW (ref 35.0–45.0)
pH, Arterial: 7.162 — CL (ref 7.350–7.450)
pO2, Arterial: 119 mmHg — ABNORMAL HIGH (ref 80.0–100.0)

## 2012-11-25 LAB — COMPREHENSIVE METABOLIC PANEL
AST: 25 U/L (ref 0–37)
CO2: 11 mEq/L — ABNORMAL LOW (ref 19–32)
Calcium: 9.5 mg/dL (ref 8.4–10.5)
Creatinine, Ser: 0.97 mg/dL (ref 0.50–1.10)
GFR calc Af Amer: 81 mL/min — ABNORMAL LOW (ref >90)
GFR calc non Af Amer: 69 mL/min — ABNORMAL LOW (ref >90)

## 2012-11-25 LAB — CBC
HCT: 36.7 % (ref 36.0–46.0)
Hemoglobin: 11.1 g/dL — ABNORMAL LOW (ref 12.0–15.0)
RBC: 4.57 MIL/uL (ref 3.87–5.11)
WBC: 17 10*3/uL — ABNORMAL HIGH (ref 4.0–10.5)

## 2012-11-25 LAB — KETONES, QUALITATIVE

## 2012-11-25 LAB — LACTIC ACID, PLASMA: Lactic Acid, Venous: 4.3 mmol/L — ABNORMAL HIGH (ref 0.5–2.2)

## 2012-11-25 MED ORDER — HYDROMORPHONE HCL PF 1 MG/ML IJ SOLN: 1.0000 mg | INTRAMUSCULAR | Status: DC | PRN

## 2012-11-25 MED ORDER — ONDANSETRON HCL 4 MG/2ML IJ SOLN: 4.0000 mg | Freq: Three times a day (TID) | INTRAMUSCULAR | Status: DC | PRN

## 2012-11-25 MED ORDER — IOHEXOL 300 MG/ML  SOLN: 50.0000 mL | Freq: Once | INTRAMUSCULAR | Status: DC | PRN

## 2012-11-25 MED ORDER — DEXTROSE-NACL 5-0.45 % IV SOLN: INTRAVENOUS | Status: DC

## 2012-11-25 MED ORDER — POTASSIUM CHLORIDE 10 MEQ/100ML IV SOLN: 10.0000 meq | INTRAVENOUS | Status: AC

## 2012-11-25 MED ORDER — MORPHINE SULFATE 4 MG/ML IJ SOLN
4.0000 mg | Freq: Once | INTRAMUSCULAR | Status: AC
  Administered 2012-11-25: 4 mg via INTRAVENOUS
  Filled 2012-11-25: qty 1

## 2012-11-25 MED ORDER — SODIUM CHLORIDE 0.9 % IV BOLUS (SEPSIS)
1000.0000 mL | Freq: Once | INTRAVENOUS | Status: AC
  Administered 2012-11-25: 1000 mL via INTRAVENOUS

## 2012-11-25 MED ORDER — DEXTROSE 50 % IV SOLN: 25.0000 mL | INTRAVENOUS | Status: DC | PRN

## 2012-11-25 MED ORDER — SODIUM CHLORIDE 0.9 % IV SOLN
INTRAVENOUS | Status: DC
  Filled 2012-11-25: qty 1

## 2012-11-25 MED ORDER — DIPHENHYDRAMINE HCL 50 MG/ML IJ SOLN
INTRAMUSCULAR | Status: AC
  Administered 2012-11-25: 50 mg
  Filled 2012-11-25: qty 1

## 2012-11-25 MED ORDER — DEXTROSE 50 % IV SOLN
25.0000 mL | INTRAVENOUS | Status: DC | PRN
  Filled 2012-11-25: qty 50

## 2012-11-25 MED ORDER — SODIUM CHLORIDE 0.9 % IV SOLN
INTRAVENOUS | Status: AC
  Filled 2012-11-25 (×2): qty 1

## 2012-11-25 MED ORDER — INSULIN REGULAR BOLUS VIA INFUSION
0.0000 [IU] | Freq: Three times a day (TID) | INTRAVENOUS | Status: DC
Start: 2012-11-25 — End: 2012-11-25
  Filled 2012-11-25: qty 10

## 2012-11-25 MED ORDER — LORAZEPAM 2 MG/ML IJ SOLN
0.5000 mg | Freq: Four times a day (QID) | INTRAMUSCULAR | Status: DC | PRN
  Administered 2012-11-25: 0.5 mg via INTRAVENOUS
  Filled 2012-11-25: qty 1

## 2012-11-25 MED ORDER — IOHEXOL 300 MG/ML  SOLN
25.0000 mL | INTRAMUSCULAR | Status: AC
Start: 2012-11-25 — End: 2012-11-25

## 2012-11-25 MED ORDER — PREGABALIN 75 MG PO CAPS
150.0000 mg | ORAL_CAPSULE | Freq: Two times a day (BID) | ORAL | Status: DC
  Administered 2012-11-25 – 2012-11-28 (×6): 150 mg via ORAL
  Filled 2012-11-25: qty 1
  Filled 2012-11-25: qty 2
  Filled 2012-11-25 (×2): qty 1
  Filled 2012-11-25: qty 2
  Filled 2012-11-25: qty 3

## 2012-11-25 MED ORDER — DEXTROSE-NACL 5-0.45 % IV SOLN
INTRAVENOUS | Status: DC
  Administered 2012-11-25 – 2012-11-26 (×2): via INTRAVENOUS

## 2012-11-25 MED ORDER — ONDANSETRON HCL 4 MG/2ML IJ SOLN
4.0000 mg | Freq: Once | INTRAMUSCULAR | Status: AC
  Administered 2012-11-25: 4 mg via INTRAVENOUS

## 2012-11-25 MED ORDER — SODIUM CHLORIDE 0.9 % IV SOLN
INTRAVENOUS | Status: DC
  Administered 2012-11-25 – 2012-11-27 (×3): via INTRAVENOUS

## 2012-11-25 MED ORDER — DIPHENHYDRAMINE HCL 50 MG/ML IJ SOLN
25.0000 mg | Freq: Once | INTRAMUSCULAR | Status: AC
  Administered 2012-11-25: 25 mg via INTRAVENOUS

## 2012-11-25 MED ORDER — IOHEXOL 300 MG/ML  SOLN
80.0000 mL | Freq: Once | INTRAMUSCULAR | Status: AC | PRN
  Administered 2012-11-25: 80 mL via INTRAVENOUS

## 2012-11-25 MED ORDER — HYDROMORPHONE HCL PF 2 MG/ML IJ SOLN
1.0000 mg | INTRAMUSCULAR | Status: DC | PRN
  Administered 2012-11-25: 18:00:00 via INTRAVENOUS
  Administered 2012-11-26 – 2012-11-27 (×3): 1 mg via INTRAVENOUS
  Filled 2012-11-25 (×4): qty 1

## 2012-11-25 MED ORDER — SODIUM CHLORIDE 0.9 % IV SOLN: INTRAVENOUS | Status: DC

## 2012-11-25 MED ORDER — PROMETHAZINE HCL 25 MG/ML IJ SOLN
12.5000 mg | Freq: Four times a day (QID) | INTRAMUSCULAR | Status: DC | PRN
  Administered 2012-11-25 – 2012-11-26 (×3): 12.5 mg via INTRAVENOUS
  Filled 2012-11-25 (×4): qty 1

## 2012-11-25 MED ORDER — METRONIDAZOLE IN NACL 5-0.79 MG/ML-% IV SOLN
500.0000 mg | Freq: Three times a day (TID) | INTRAVENOUS | Status: DC
  Administered 2012-11-25 – 2012-11-26 (×2): 500 mg via INTRAVENOUS
  Filled 2012-11-25 (×4): qty 100

## 2012-11-25 NOTE — ED Provider Notes (Signed)
History     CSN: [id number]  Arrival date & time 11/25/12  7829   First MD Initiated Contact with Patient 11/25/12 1038      Chief Complaint  Patient presents with  . Diarrhea  . Emesis    (Consider location/radiation/quality/duration/timing/severity/associated sxs/prior treatment) HPI Comments: Patient presents to the ER for evaluation nausea, vomiting and diarrhea. Patient reports that the symptoms have been present for 2 days. She has not experienced a fever and there is no abdominal pain associated with the symptoms. Patient has not been able to take her medications because of the nausea and vomiting. Patient is denying sore throat, cough, chest pain. Patient does, however, report that she is experiencing a lot of anxiety over the last 1 day.  Patient is a 47 y.o. female presenting with diarrhea and vomiting.  Diarrhea Associated symptoms: vomiting   Associated symptoms: no abdominal pain   Emesis Associated symptoms: diarrhea   Associated symptoms: no abdominal pain     Past Medical History  Diagnosis Date  . Diabetes mellitus     diagnosed age 57  . Hypertension   . Cataract   . Anxiety   . Anemia   . GERD (gastroesophageal reflux disease)   . Constipation 04/03/2012  . Complication of anesthesia   . PONV (postoperative nausea and vomiting)   . Asthma     IN CHILDHOOD  . Neuromuscular disorder     Diabetic neuropathy    Past Surgical History  Procedure Laterality Date  . Retinal detachment surgery    . Posterior vitrectomy and membrane peel-left eye  09/28/2002  . Posterior vitrectomy and membrane peel-right eye  03/16/2002  . Eye surgery    . Gailstones      Family History  Problem Relation Age of Onset  . Cystic fibrosis Mother   . Hypertension Father   . Diabetes Brother   . Hypertension Maternal Grandmother     History  Substance Use Topics  . Smoking status: Never Smoker   . Smokeless tobacco: Never Used  . Alcohol Use: Yes     Comment: occas   wine    OB History   Grav Para Term Preterm Abortions TAB SAB Ect Mult Living                  Review of Systems  Respiratory: Negative.   Cardiovascular: Negative.   Gastrointestinal: Positive for vomiting and diarrhea. Negative for abdominal pain.  Neurological: Positive for weakness.  All other systems reviewed and are negative.    Allergies  Anesthetics, amide  Home Medications   Current Outpatient Rx  Name  Route  Sig  Dispense  Refill  . aspirin EC 81 MG EC tablet   Oral   Take 1 tablet (81 mg total) by mouth daily.         . insulin aspart protamine- aspart (NOVOLOG 70/30) (70-30) 100 UNIT/ML injection      45 units in the am 20 units in pm   10 mL   12   . promethazine (PHENERGAN) 25 MG tablet   Oral   Take 25 mg by mouth every 6 (six) hours as needed. For nausea.         . ferrous sulfate 325 (65 FE) MG tablet   Oral   Take 325 mg by mouth 2 (two) times daily.         . furosemide (LASIX) 20 MG tablet   Oral   Take 2 tablets (40 mg total)  by mouth 2 (two) times daily.   30 tablet   0   . lisinopril (PRINIVIL,ZESTRIL) 10 MG tablet   Oral   Take 10 mg by mouth every morning.          . pravastatin (PRAVACHOL) 20 MG tablet   Oral   Take 20 mg by mouth at bedtime.         . pregabalin (LYRICA) 150 MG capsule   Oral   Take 150 mg by mouth 2 (two) times daily.           BP 142/59  Pulse 108  Temp(Src) 98.2 F (36.8 C) (Oral)  Resp 17  SpO2 100%  LMP 11/03/2012  Physical Exam  Constitutional: She is oriented to person, place, and time. She appears well-developed and well-nourished. No distress.  HENT:  Head: Normocephalic and atraumatic.  Right Ear: Hearing normal.  Left Ear: Hearing normal.  Nose: Nose normal.  Mouth/Throat: Oropharynx is clear and moist and mucous membranes are normal.  Eyes: Conjunctivae and EOM are normal. Pupils are equal, round, and reactive to light.  Neck: Normal range of motion. Neck supple.   Cardiovascular: Regular rhythm, S1 normal and S2 normal.  Exam reveals no gallop and no friction rub.   No murmur heard. Pulmonary/Chest: Effort normal and breath sounds normal. No respiratory distress. She exhibits no tenderness.  Abdominal: Soft. Normal appearance and bowel sounds are normal. There is no hepatosplenomegaly. There is no tenderness. There is no rebound, no guarding, no tenderness at McBurney's point and negative Murphy's sign. No hernia.  Musculoskeletal: Normal range of motion.  Neurological: She is alert and oriented to person, place, and time. She has normal strength. No cranial nerve deficit or sensory deficit. Coordination normal. GCS eye subscore is 4. GCS verbal subscore is 5. GCS motor subscore is 6.  Skin: Skin is warm, dry and intact. No rash noted. No cyanosis.  Psychiatric: Her speech is normal and behavior is normal. Thought content normal. Her mood appears anxious.    ED Course  Procedures (including critical care time)  Labs Reviewed  CBC WITH DIFFERENTIAL - Abnormal; Notable for the following:    WBC 14.4 (*)    Hemoglobin 10.9 (*)    MCH 23.2 (*)    MCHC 29.6 (*)    RDW 20.6 (*)    Platelets 415 (*)    Neutrophils Relative % 84 (*)    Neutro Abs 12.2 (*)    Lymphocytes Relative 11 (*)    All other components within normal limits  COMPREHENSIVE METABOLIC PANEL - Abnormal; Notable for the following:    Sodium 133 (*)    Chloride 94 (*)    CO2 11 (*)    Glucose, Bld 562 (*)    Alkaline Phosphatase 152 (*)    GFR calc non Af Amer 69 (*)    GFR calc Af Amer 81 (*)    All other components within normal limits  URINALYSIS, ROUTINE W REFLEX MICROSCOPIC - Abnormal; Notable for the following:    Specific Gravity, Urine 1.032 (*)    Glucose, UA >1000 (*)    Ketones, ur >80 (*)    All other components within normal limits  GLUCOSE, CAPILLARY - Abnormal; Notable for the following:    Glucose-Capillary 495 (*)    All other components within normal limits   KETONES, QUALITATIVE - Abnormal; Notable for the following:    Acetone, Bld MODERATE (*)    All other components within normal limits  BLOOD  GAS, ARTERIAL - Abnormal; Notable for the following:    pH, Arterial 7.162 (*)    pCO2 arterial 24.5 (*)    pO2, Arterial 119.0 (*)    Bicarbonate 8.4 (*)    Acid-base deficit 18.9 (*)    All other components within normal limits  GLUCOSE, CAPILLARY - Abnormal; Notable for the following:    Glucose-Capillary 536 (*)    All other components within normal limits  LACTIC ACID, PLASMA - Abnormal; Notable for the following:    Lactic Acid, Venous 4.3 (*)    All other components within normal limits  CBC - Abnormal; Notable for the following:    WBC 17.0 (*)    Hemoglobin 11.1 (*)    MCH 24.3 (*)    RDW 20.8 (*)    All other components within normal limits  BASIC METABOLIC PANEL - Abnormal; Notable for the following:    Potassium 5.8 (*)    CO2 7 (*)    Glucose, Bld 531 (*)    GFR calc non Af Amer 64 (*)    GFR calc Af Amer 74 (*)    All other components within normal limits  BASIC METABOLIC PANEL - Abnormal; Notable for the following:    CO2 11 (*)    Glucose, Bld 333 (*)    Creatinine, Ser 1.12 (*)    GFR calc non Af Amer 58 (*)    GFR calc Af Amer 68 (*)    All other components within normal limits  BASIC METABOLIC PANEL - Abnormal; Notable for the following:    CO2 12 (*)    Glucose, Bld 236 (*)    GFR calc non Af Amer 63 (*)    GFR calc Af Amer 73 (*)    All other components within normal limits  BASIC METABOLIC PANEL - Abnormal; Notable for the following:    CO2 15 (*)    Glucose, Bld 173 (*)    GFR calc non Af Amer 65 (*)    GFR calc Af Amer 76 (*)    All other components within normal limits  BASIC METABOLIC PANEL - Abnormal; Notable for the following:    CO2 16 (*)    Glucose, Bld 144 (*)    GFR calc non Af Amer 69 (*)    GFR calc Af Amer 80 (*)    All other components within normal limits  BASIC METABOLIC PANEL -  Abnormal; Notable for the following:    Chloride 113 (*)    CO2 17 (*)    Glucose, Bld 130 (*)    GFR calc non Af Amer 64 (*)    GFR calc Af Amer 74 (*)    All other components within normal limits  BASIC METABOLIC PANEL - Abnormal; Notable for the following:    Chloride 116 (*)    CO2 18 (*)    Glucose, Bld 119 (*)    GFR calc non Af Amer 64 (*)    GFR calc Af Amer 74 (*)    All other components within normal limits  GLUCOSE, CAPILLARY - Abnormal; Notable for the following:    Glucose-Capillary 236 (*)    All other components within normal limits  CBC - Abnormal; Notable for the following:    WBC 14.1 (*)    Hemoglobin 10.0 (*)    HCT 32.1 (*)    MCV 77.9 (*)    MCH 24.3 (*)    RDW 20.9 (*)    Platelets 408 (*)  All other components within normal limits  MRSA PCR SCREENING  CLOSTRIDIUM DIFFICILE BY PCR  CULTURE, BLOOD (ROUTINE X 2)  LIPASE, BLOOD  URINE MICROSCOPIC-ADD ON  POCT PREGNANCY, URINE   Ct Abdomen Pelvis W Contrast  11/25/2012   *RADIOLOGY REPORT*  Clinical Data: Abdominal pain.  Type 1 diabetic.  Nausea and vomiting.  CT ABDOMEN AND PELVIS WITH CONTRAST  Technique:  Multidetector CT imaging of the abdomen and pelvis was performed following the standard protocol during bolus administration of intravenous contrast.  Contrast: 80mL OMNIPAQUE IOHEXOL 300 MG/ML  SOLN  Comparison: 03/29/2010.  Findings: Lung Bases: Normal.  Liver:  Fatty infiltration adjacent to the falciform ligament. Otherwise normal.  Spleen:  Normal.  Gallbladder:  Cholecystectomy with clips in the fossa. Cholecystectomy with clips in the gallbladder fossa.  Common bile duct:  Normal.  Pancreas:  Normal.  Adrenal glands:  Normal.  Kidneys:  Normal enhancement.  Normal delayed excretion of contrast.  Both ureters appear within normal limits.  Stomach:  Patulous gastroesophageal junction.  Small bowel:  Normal duodenum.  Small bowel is decompressed.  No mesenteric adenopathy.  No inflammatory changes of  small bowel.  Colon:   Normal appendix.  Majority of colon decompressed.  No inflammatory changes.  Pelvic Genitourinary:  Marked distention of the urinary bladder, with 16 cm cranial caudal dimension.  Although this is unusual, this appears similar to the prior exam of 2011.  Question chronic neurogenic bladder. Calcified and noncalcified uterine fibroids. Ovaries appear within normal limits.  No definite free fluid.  Bones:  No aggressive osseous lesions.  Vasculature: Minimal atherosclerosis.  No acute abnormality.  Body Wall: Normal.  IMPRESSION:  1.  No definite acute intra-abdominal abnormality. 2.  Marked distention of the urinary bladder.  This was present on the prior CT.  This raises possibility of neurogenic bladder. 3.  Fibroid uterus. 4.  Cholecystectomy.   Original Report Authenticated By: Andreas Newport, M.D.   Dg Abd Acute W/chest  11/25/2012   *RADIOLOGY REPORT*  Clinical Data: Nausea, vomiting, and diarrhea.  ACUTE ABDOMEN SERIES (ABDOMEN 2 VIEW & CHEST 1 VIEW)  Comparison: Chest x-ray dated 11/05/2012 and abdominal radiograph dated 08/07/2012  Findings: The heart and lungs are normal.  There is no free air in the abdomen.  The bowel is almost completely devoid of bowel gas. No appreciable dilated loops of large or small bowel.  No acute osseous abnormality. Previous cholecystectomy.  IMPRESSION: Benign-appearing abdomen and chest.   Original Report Authenticated By: Francene Boyers, M.D.     Diagnosis: DKA    MDM  Patient presents to the ER for evaluation of nausea, vomiting and diarrhea. Patient reports symptoms present for 2 days. She appears to be dehydrated. The patient has a history of diabetes, has not been taking her medications. Blood sugar was markedly elevated. Blood work is consistent with diabetic ketoacidosis with moderate to severe acidosis. Patient started on insulin drip. She was aggressively hydrated.  Patient initially had no complaints of abdominal pain. Her abdominal  exam was benign around. Once she had family in the room, patient began to complain of severe pain in the abdomen. And ask for narcotic analgesia. Because her abdominal exam was benign on arrival, I did not suspect any acute surgical process. Some of the complaints of pain were likely for the benefit of family. It was felt the patient was appropriate for admission for treatment of her DKA at this point.        Gilda Crease, MD  11/26/12 0710 

## 2012-11-25 NOTE — ED Notes (Signed)
Pt c/o NVD x 2 days.  States hx of diabetes.  Denies abd pain.

## 2012-11-25 NOTE — Progress Notes (Signed)
Pt with CRITICAL VALUE ALERT  Critical value received: CO2 = 7  Date of notification:  11/25/2012  Time of notification: 1726  Critical value read back:yes  Nurse who received alert:  Lorrin Jackson RN  MD notified (1st page): Dr. Sunnie Nielsen  Time of first page:  1728 MD notified (2nd page):n/a  Time of second page:n/a  Responding MD: Dr. Sunnie Nielsen   Time MD responded:  [phone]

## 2012-11-25 NOTE — Progress Notes (Signed)
Notified Dr. Sunnie Nielsen of labs, and only one blood culture done for now.  Continue to monitor BMP's every 2 hours.  Continue insulin drip.  Daquawn Seelman Debroah Loop RN

## 2012-11-25 NOTE — ED Notes (Signed)
IV DID NOT INFILTRATED IN ED.  RASH AND HIVES APPEARED AFTER ADMINISTRATION OF MORPHINE BELOW IV SITE.  PT DID NOT HAVE ANY COMPLAINTS AT IV SITE.  IV INFUSING WITHOUT DIFFICULTY,  THEIR WAS NOT SWELLING AT SITE. BENADRYL GIVEN AS ORDERED.  AT 1410.  THERE IS NO PRESENCE OF REDNESS, RASH OR HIVES.  Iv SITE CONTINUES TO INFUSE WITHOUT DIFFICULTY.

## 2012-11-25 NOTE — H&P (Signed)
[facility] History and Physical  Rumi Kolodziej OZH:[id number] DOB: Jul 09, 1965 DOA: 11/25/2012  Referring physician: Dr Blinda Leatherwood.  PCP: Lonia Blood, MD  Specialists: none  Chief Complaint: Nausea and vomiting.   HPI: [name] is a 47 y.o. female with PMH significant for Diabetes type 1, who presents complaining of nausea, vomiting and abdominal pain for almost 2 days. She relates pain as sharp, mid abdomen. She has vomit multiple times. No blood on it. She also relates watery stool  that started day prior to admission, 4 to 5 episodes. She denies cough, dysuria. She has been taking  her insulin.   Patient relates she is having menstrual period. Prior to this her last Menstrual period was May the 20.   Patient develop localized skin rash near  IV access  after administration of morphine.    Review of Systems: Negative, except as per HPI.   Past Medical History  Diagnosis Date  . Diabetes mellitus     diagnosed age 36  . Hypertension   . Cataract   . Anxiety   . Anemia   . GERD (gastroesophageal reflux disease)   . Constipation 04/03/2012  . Complication of anesthesia   . PONV (postoperative nausea and vomiting)   . Asthma     IN CHILDHOOD  . Neuromuscular disorder     Diabetic neuropathy   Past Surgical History  Procedure Laterality Date  . Retinal detachment surgery    . Posterior vitrectomy and membrane peel-left eye  09/28/2002  . Posterior vitrectomy and membrane peel-right eye  03/16/2002  . Eye surgery    . Gailstones     Social History:  reports that she has never smoked. She has never used smokeless tobacco. She reports that  drinks alcohol. She reports that she does not use illicit drugs.She has one son 22 yo. She works in [employer].    Allergies  Allergen Reactions  . Anesthetics, Amide Nausea And Vomiting  . Morphine And Related Rash    Family History  Problem Relation Age of Onset  . Cystic fibrosis Mother   . Hypertension Father   .  Diabetes Brother   . Hypertension Maternal Grandmother     Prior to Admission medications   Medication Sig Start Date End Date Taking? Authorizing Provider  aspirin EC 81 MG EC tablet Take 1 tablet (81 mg total) by mouth daily. 11/07/12  Yes Marinda Elk, MD  insulin aspart protamine- aspart (NOVOLOG 70/30) (70-30) 100 UNIT/ML injection 45 units in the am 20 units in pm 11/07/12  Yes Marinda Elk, MD  promethazine (PHENERGAN) 25 MG tablet Take 25 mg by mouth every 6 (six) hours as needed. For nausea. 03/28/12  Yes Dorothea Ogle, MD  ferrous sulfate 325 (65 FE) MG tablet Take 325 mg by mouth 2 (two) times daily.    Historical Provider, MD  furosemide (LASIX) 20 MG tablet Take 2 tablets (40 mg total) by mouth 2 (two) times daily. 11/07/12   Marinda Elk, MD  lisinopril (PRINIVIL,ZESTRIL) 10 MG tablet Take 10 mg by mouth every morning.     Historical Provider, MD  pravastatin (PRAVACHOL) 20 MG tablet Take 20 mg by mouth at bedtime.    Historical Provider, MD  pregabalin (LYRICA) 150 MG capsule Take 150 mg by mouth 2 (two) times daily.    Historical Provider, MD   Physical Exam: Filed Vitals:   11/25/12 0938 11/25/12 1130  BP: 137/53 142/59  Pulse: 107 108  Temp: 98.2 F (36.8  C)   TempSrc: Oral   Resp: 16 17  SpO2: 97% 100%   General Appearance:    Alert, cooperative, mild  Distress due to pain, appears stated age  Head:    Normocephalic, without obvious abnormality, atraumatic  Eyes:    PERRL, conjunctiva/corneas clear, EOM's intact,   Ears:    Normal TM's and external ear canals, both ears  Nose:   Nares normal, septum midline, mucosa normal, no drainage    or sinus tenderness  Throat:   Lips, mucosa, and tongue normal; teeth and gums normal  Neck:   Supple, symmetrical, trachea midline, no adenopathy;    thyroid:  no enlargement/tenderness/nodules; no carotid   bruit or JVD  Back:     Symmetric, no curvature, ROM normal, no CVA tenderness  Lungs:     Clear to  auscultation bilaterally, respirations unlabored  Chest Wall:    No tenderness or deformity   Heart:    Regular rate and rhythm, S1 and S2 normal, no murmur, rub   or gallop     Abdomen:     Soft, generalized tender, bowel sounds active all four quadrants,    no masses, no organomegaly        Extremities:   Extremities normal, atraumatic, no cyanosis or edema  Pulses:   2+ and symmetric all extremities  Skin:   Skin color, texture, turgor normal, no rashes or lesions  Lymph nodes:   Cervical, supraclavicular, and axillary nodes normal  Neurologic:   CNII-XII intact, normal strength, sensation and reflexes    throughout      Labs on Admission:  Basic Metabolic Panel:  Recent Labs Lab 11/25/12 0950  NA 133*  K 4.5  CL 94*  CO2 11*  GLUCOSE 562*  BUN 16  CREATININE 0.97  CALCIUM 9.5   Liver Function Tests:  Recent Labs Lab 11/25/12 0950  AST 25  ALT 32  ALKPHOS 152*  BILITOT 0.7  PROT 8.1  ALBUMIN 3.5    Recent Labs Lab 11/25/12 0950  LIPASE 22   No results found for this basename: AMMONIA,  in the last 168 hours CBC:  Recent Labs Lab 11/25/12 0950  WBC 14.4*  NEUTROABS 12.2*  HGB 10.9*  HCT 36.8  MCV 78.5  PLT 415*   Cardiac Enzymes: No results found for this basename: CKTOTAL, CKMB, CKMBINDEX, TROPONINI,  in the last 168 hours  BNP (last 3 results)  Recent Labs  11/05/12 1821  PROBNP 158.2*   CBG:  Recent Labs Lab 11/25/12 0937 11/25/12 1327  GLUCAP 495* 536*    Radiological Exams on Admission: Dg Abd Acute W/chest  11/25/2012   *RADIOLOGY REPORT*  Clinical Data: Nausea, vomiting, and diarrhea.  ACUTE ABDOMEN SERIES (ABDOMEN 2 VIEW & CHEST 1 VIEW)  Comparison: Chest x-ray dated 11/05/2012 and abdominal radiograph dated 08/07/2012  Findings: The heart and lungs are normal.  There is no free air in the abdomen.  The bowel is almost completely devoid of bowel gas. No appreciable dilated loops of large or small bowel.  No acute osseous  abnormality. Previous cholecystectomy.  IMPRESSION: Benign-appearing abdomen and chest.   Original Report Authenticated By: Francene Boyers, M.D.     Assessment/Plan Active Problems:   DIABETES MELLITUS, TYPE I   DKA (diabetic ketoacidoses)   Abdominal pain   Nausea and vomiting  1-DKA, anion gap metabolic acidosis acidosis. Gap increase at 28, bicarb at 11, CBG 562, PH 7.1, ketone in urine. Patient received 2 L IV fluids in  the ED. Continue with IV fluids, insulin Gtt, frequent B-met, replete K as needed. When gap close and bicarb more than 20 will transition to long acting insulin. If CBG decrease below 250 and gap is not close will need to use D 5 NS IV fluids. Patient relates she has been using insulin. Will do a work up to rule out infections.   2-Nausea, Vomiting abdominal pain, Diarrhea: This could be secondary to DKA, will check CT abdomen and pelvis, will order C diff. Will start flagyl to cover empirically for C diff. Patient was hospitalized last month. Monitor for rash with dilaudid administration.   3-Leukocytosis: This could be secondary to DKA. Will check for C diff. Blood cultures, UA negative for infection.  4-HTN: Hold lisinopril, in setting of dehydration, and patient will received contrast for CT scan.  5-Hyponatremia: Pseudohyponatremia.  6-Anemia, iron deficiency: resume oral iron when tolerating oral.    Code Status: presume full Code.  Family Communication: Care discussed with patient.  Disposition Plan: expect 2 to 3 days inpatient.   Time spent: 75 minutes.   Tamirra Sienkiewicz [facility] Pager [phone]  If 7PM-7AM, please contact night-coverage [email or web address] Password [email or web address] 11/25/2012, 1:50 PM

## 2012-11-25 NOTE — ED Notes (Signed)
ICU Amy RN aware of pending Lab orders that need recollecting, Potassium IV, c-diff collection and precautions, Glucostabilizer 4.8 start just before pt transferred upstairs.  Seirra RN witnessed IV to RT to be patented and without problems ad pt is without complaint.

## 2012-11-26 DIAGNOSIS — E860 Dehydration: Secondary | ICD-10-CM

## 2012-11-26 LAB — BASIC METABOLIC PANEL
BUN: 14 mg/dL (ref 6–23)
BUN: 14 mg/dL (ref 6–23)
BUN: 14 mg/dL (ref 6–23)
CO2: 16 mEq/L — ABNORMAL LOW (ref 19–32)
CO2: 17 mEq/L — ABNORMAL LOW (ref 19–32)
CO2: 18 mEq/L — ABNORMAL LOW (ref 19–32)
Calcium: 8.7 mg/dL (ref 8.4–10.5)
Chloride: 112 mEq/L (ref 96–112)
Chloride: 112 mEq/L (ref 96–112)
Chloride: 113 mEq/L — ABNORMAL HIGH (ref 96–112)
Creatinine, Ser: 0.98 mg/dL (ref 0.50–1.10)
GFR calc Af Amer: 76 mL/min — ABNORMAL LOW (ref >90)
GFR calc non Af Amer: 64 mL/min — ABNORMAL LOW (ref >90)
Glucose, Bld: 119 mg/dL — ABNORMAL HIGH (ref 70–99)
Glucose, Bld: 130 mg/dL — ABNORMAL HIGH (ref 70–99)
Glucose, Bld: 173 mg/dL — ABNORMAL HIGH (ref 70–99)
Potassium: 4 mEq/L (ref 3.5–5.1)
Potassium: 4.2 mEq/L (ref 3.5–5.1)
Potassium: 4.2 mEq/L (ref 3.5–5.1)
Sodium: 141 mEq/L (ref 135–145)
Sodium: 142 mEq/L (ref 135–145)
Sodium: 144 mEq/L (ref 135–145)

## 2012-11-26 LAB — CBC
HCT: 32.1 % — ABNORMAL LOW (ref 36.0–46.0)
Hemoglobin: 10 g/dL — ABNORMAL LOW (ref 12.0–15.0)
MCH: 24.3 pg — ABNORMAL LOW (ref 26.0–34.0)
MCV: 77.9 fL — ABNORMAL LOW (ref 78.0–100.0)
RBC: 4.12 MIL/uL (ref 3.87–5.11)
WBC: 14.1 10*3/uL — ABNORMAL HIGH (ref 4.0–10.5)

## 2012-11-26 LAB — GLUCOSE, CAPILLARY
Glucose-Capillary: 382 mg/dL — ABNORMAL HIGH (ref 70–99)
Glucose-Capillary: 297 mg/dL — ABNORMAL HIGH (ref 70–99)
Glucose-Capillary: 214 mg/dL — ABNORMAL HIGH (ref 70–99)
Glucose-Capillary: 200 mg/dL — ABNORMAL HIGH (ref 70–99)
Glucose-Capillary: 172 mg/dL — ABNORMAL HIGH (ref 70–99)
Glucose-Capillary: 125 mg/dL — ABNORMAL HIGH (ref 70–99)
Glucose-Capillary: 109 mg/dL — ABNORMAL HIGH (ref 70–99)
Glucose-Capillary: 131 mg/dL — ABNORMAL HIGH (ref 70–99)

## 2012-11-26 MED ORDER — PROMETHAZINE HCL 25 MG/ML IJ SOLN
25.0000 mg | INTRAMUSCULAR | Status: DC | PRN
  Administered 2012-11-26 – 2012-11-27 (×3): 25 mg via INTRAVENOUS
  Filled 2012-11-26 (×3): qty 1

## 2012-11-26 MED ORDER — ENOXAPARIN SODIUM 40 MG/0.4ML ~~LOC~~ SOLN
40.0000 mg | SUBCUTANEOUS | Status: DC
  Administered 2012-11-26 – 2012-11-28 (×3): 40 mg via SUBCUTANEOUS
  Filled 2012-11-26 (×3): qty 0.4

## 2012-11-26 MED ORDER — INSULIN ASPART 100 UNIT/ML ~~LOC~~ SOLN
0.0000 [IU] | Freq: Three times a day (TID) | SUBCUTANEOUS | Status: DC
  Administered 2012-11-26: 5 [IU] via SUBCUTANEOUS
  Administered 2012-11-26 – 2012-11-27 (×2): 8 [IU] via SUBCUTANEOUS
  Administered 2012-11-28: 3 [IU] via SUBCUTANEOUS

## 2012-11-26 MED ORDER — METOCLOPRAMIDE HCL 5 MG/ML IJ SOLN
10.0000 mg | Freq: Four times a day (QID) | INTRAMUSCULAR | Status: DC
  Administered 2012-11-26 – 2012-11-28 (×7): 10 mg via INTRAVENOUS
  Filled 2012-11-26 (×12): qty 2

## 2012-11-26 MED ORDER — INSULIN DETEMIR 100 UNIT/ML ~~LOC~~ SOLN
20.0000 [IU] | Freq: Every day | SUBCUTANEOUS | Status: DC
  Administered 2012-11-26: 20 [IU] via SUBCUTANEOUS
  Filled 2012-11-26 (×2): qty 0.2

## 2012-11-26 NOTE — Progress Notes (Signed)
[facility] PROGRESS NOTE  [name] ZOX:[id number] DOB: 06-29-1965 DOA: 11/25/2012 PCP: Lonia Blood, MD   Brief narrative: 47 y/o female with uncontrolled type 1 DM ( A1C of 10.4)  With neuropathy, ? gastroparesis, HTN admitted to stepdown with DKA.  Assessment/Plan:   DKA  admitted stepdown on glucose stabilizer. Gap resolved. Off insulin drip. patient still tachycardic and appears quite dehydrated. continue IV fluids. Start on levemir 20 units and premeal aspart SS. Patient on NPH at home and reports missing out some  doses as her blood glucose starts dropping frequently. i will switch her to long acting insulin and premeal Aspart for better fsg control. Diabetes coordinator consulted.  Nausea and vomiting  ? Diabetic gastroparesis Place on scheduled reglan and prn phenergan Diarrhea resolved. CT abd negative. Still has some epigastric and suprapubic pain.  Diarrhea resolved. Doubt c diff and leucocytosis likely related to acute stress. Afebrile. Will d.c flagyl  Iron def anemia  on iron supplements Hb stable  HTN  hold BP meds  DVT prophlyaixs   Diet: start on clears    Code Status: full Family Communication: none at bedside Disposition Plan: transfer to tele if stable during the day   Consultants:  none  Procedures:  none  Antibiotics:  Flagyl 6/3-6/4  HPI/Subjective: Still nauseous and epigastric discomfort, no vomiting or diarrhea  Objective: Filed Vitals:   11/26/12 0500 11/26/12 0600 11/26/12 0700 11/26/12 0800  BP: 113/52 111/46 118/52 104/48  Pulse: 99 97 99 99  Temp:      TempSrc:      Resp: 19 17 16 23   Height:      Weight:      SpO2: 98% 100% 100% 96%    Intake/Output Summary (Last 24 hours) at 11/26/12 0828 Last data filed at 11/26/12 0800  Gross per 24 hour  Intake 4295.79 ml  Output    925 ml  Net 3370.79 ml   Filed Weights   11/25/12 1600  Weight: 59.6 kg (131 lb 6.3 oz)    Exam:   General:  Middle aged  female appears fatigued  HEENT: no pallor, dry oral mucosa  Cardiovascular: S1&S2 tachycardic, no murmurs  Respiratory: clear b/l, no added sounds  Abdomen: soft, non distended, epigastric and suprapubic tenderness, BS+  Musculoskeletal: warm, no edema  CNS: AAOX3  Data Reviewed: Basic Metabolic Panel:  Recent Labs Lab 11/25/12 2048 11/25/12 2330 11/26/12 0012 11/26/12 0320 11/26/12 0505  NA 141 142 141 141 144  K 4.6 4.2 4.2 4.2 4.0  CL 111 112 112 113* 116*  CO2 12* 15* 16* 17* 18*  GLUCOSE 236* 173* 144* 130* 119*  BUN 15 14 14 14 14   CREATININE 1.05 1.02 0.98 1.04 1.04  CALCIUM 9.1 9.0 9.1 8.7 8.8   Liver Function Tests:  Recent Labs Lab 11/25/12 0950  AST 25  ALT 32  ALKPHOS 152*  BILITOT 0.7  PROT 8.1  ALBUMIN 3.5    Recent Labs Lab 11/25/12 0950  LIPASE 22   No results found for this basename: AMMONIA,  in the last 168 hours CBC:  Recent Labs Lab 11/25/12 0950 11/25/12 1713 11/26/12 0505  WBC 14.4* 17.0* 14.1*  NEUTROABS 12.2*  --   --   HGB 10.9* 11.1* 10.0*  HCT 36.8 36.7 32.1*  MCV 78.5 80.3 77.9*  PLT 415* 358 408*   Cardiac Enzymes: No results found for this basename: CKTOTAL, CKMB, CKMBINDEX, TROPONINI,  in the last 168 hours BNP (last 3 results)  Recent Labs  11/05/12 1821  PROBNP 158.2*   CBG:  Recent Labs Lab 11/25/12 2002 11/25/12 2111 11/25/12 2220 11/25/12 2329 11/26/12 0040  GLUCAP 236* 214* 200* 172* 134*    Recent Results (from the past 240 hour(s))  MRSA PCR SCREENING     Status: None   Collection Time    11/25/12  3:59 PM      Result Value Range Status   MRSA by PCR NEGATIVE  NEGATIVE Final   Comment:            The GeneXpert MRSA Assay (FDA     approved for NASAL specimens     only), is one component of a     comprehensive MRSA colonization     surveillance program. It is not     intended to diagnose MRSA     infection nor to guide or     monitor treatment for     MRSA infections.      Studies: Ct Abdomen Pelvis W Contrast  11/25/2012   *RADIOLOGY REPORT*  Clinical Data: Abdominal pain.  Type 1 diabetic.  Nausea and vomiting.  CT ABDOMEN AND PELVIS WITH CONTRAST  Technique:  Multidetector CT imaging of the abdomen and pelvis was performed following the standard protocol during bolus administration of intravenous contrast.  Contrast: 80mL OMNIPAQUE IOHEXOL 300 MG/ML  SOLN  Comparison: 03/29/2010.  Findings: Lung Bases: Normal.  Liver:  Fatty infiltration adjacent to the falciform ligament. Otherwise normal.  Spleen:  Normal.  Gallbladder:  Cholecystectomy with clips in the fossa. Cholecystectomy with clips in the gallbladder fossa.  Common bile duct:  Normal.  Pancreas:  Normal.  Adrenal glands:  Normal.  Kidneys:  Normal enhancement.  Normal delayed excretion of contrast.  Both ureters appear within normal limits.  Stomach:  Patulous gastroesophageal junction.  Small bowel:  Normal duodenum.  Small bowel is decompressed.  No mesenteric adenopathy.  No inflammatory changes of small bowel.  Colon:   Normal appendix.  Majority of colon decompressed.  No inflammatory changes.  Pelvic Genitourinary:  Marked distention of the urinary bladder, with 16 cm cranial caudal dimension.  Although this is unusual, this appears similar to the prior exam of 2011.  Question chronic neurogenic bladder. Calcified and noncalcified uterine fibroids. Ovaries appear within normal limits.  No definite free fluid.  Bones:  No aggressive osseous lesions.  Vasculature: Minimal atherosclerosis.  No acute abnormality.  Body Wall: Normal.  IMPRESSION:  1.  No definite acute intra-abdominal abnormality. 2.  Marked distention of the urinary bladder.  This was present on the prior CT.  This raises possibility of neurogenic bladder. 3.  Fibroid uterus. 4.  Cholecystectomy.   Original Report Authenticated By: Andreas Newport, M.D.   Dg Abd Acute W/chest  11/25/2012   *RADIOLOGY REPORT*  Clinical Data: Nausea, vomiting, and  diarrhea.  ACUTE ABDOMEN SERIES (ABDOMEN 2 VIEW & CHEST 1 VIEW)  Comparison: Chest x-ray dated 11/05/2012 and abdominal radiograph dated 08/07/2012  Findings: The heart and lungs are normal.  There is no free air in the abdomen.  The bowel is almost completely devoid of bowel gas. No appreciable dilated loops of large or small bowel.  No acute osseous abnormality. Previous cholecystectomy.  IMPRESSION: Benign-appearing abdomen and chest.   Original Report Authenticated By: Francene Boyers, M.D.    Scheduled Meds: . insulin aspart  0-15 Units Subcutaneous TID WC  . insulin detemir  20 Units Subcutaneous Daily  . pregabalin  150 mg Oral BID   Continuous Infusions: .  sodium chloride Stopped (11/26/12 0743)  . dextrose 5 % and 0.45% NaCl 100 mL/hr at 11/26/12 0742  . insulin (NOVOLIN-R) infusion Stopped (11/26/12 0800)     Time spent: 35 minutes    Zoie Sarin  [facility] Pager [phone]. If 7PM-7AM, please contact night-coverage at [email or web address], password [facility] 11/26/2012, 8:28 AM  LOS: 1 day

## 2012-11-26 NOTE — Progress Notes (Signed)
Inpatient Diabetes Program Recommendations  AACE/ADA: New Consensus Statement on Inpatient Glycemic Control (2013)  Target Ranges:  Prepandial:   less than 140 mg/dL      Peak postprandial:   less than 180 mg/dL (1-2 hours)      Critically ill patients:  140 - 180 mg/dL   Reason for Visit: DKA  Pt known to Inpatient Diabetes Team from multiple admissions.  Pt states she is depressed and lonely.  Does not check blood sugars often and seems a bit confused with her different insulins.  Very sleepy and may be slightly confused.  States she takes Novolog 15-10-15 at meals and 70/30 15 units bid.  Has occasional hypoglycemia in early am.  States she has seen MD in past month.  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  Family rarely visits her and she has run out of strips.  Results for SHIVON, HACKEL (MRN [id number]) as of 11/26/2012 16:16  Ref. Range 11/05/2012 18:21  Hemoglobin A1C Latest Range: <5.7 % 10.4 (H)  Results for DORINNE, GRAEFF (MRN [id number]) as of 11/26/2012 16:16  Ref. Range 11/26/2012 03:20 11/26/2012 05:05  Sodium Latest Range: 135-145 mEq/L 141 144  Potassium Latest Range: 3.5-5.1 mEq/L 4.2 4.0  Chloride Latest Range: 96-112 mEq/L 113 (H) 116 (H)  CO2 Latest Range: 19-32 mEq/L 17 (L) 18 (L)  BUN Latest Range: 6-23 mg/dL 14 14  Creatinine Latest Range: 0.50-1.10 mg/dL 0.86 5.78  Calcium Latest Range: 8.4-10.5 mg/dL 8.7 8.8  GFR calc non Af Amer Latest Range: >90 mL/min 64 (L) 64 (L)  GFR calc Af Amer Latest Range: >90 mL/min 74 (L) 74 (L)  Glucose Latest Range: 70-99 mg/dL 469 (H) 629 (H)   AG is closed and acidosis cleared.  Recommendations:  Change basal insulin to 70/30 15 units bid.  Titrate until CBGs <150 mg/dL. Add Novolog HS correction. Gave info regarding ReliOn meter and strips which are much more affordable.    May want to consider for discharge NPH and R insulin instead of mixed since pt does not eat on regular schedule.  Stressed  importance of checking blood sugars and taking insulin as prescribed.   Will f/u in am when pt more awake and alert. Discussed with RN.  Thank you. Ailene Ards, RD, LDN, CDE Inpatient Diabetes Coordinator [phone]

## 2012-11-27 DIAGNOSIS — E109 Type 1 diabetes mellitus without complications: Secondary | ICD-10-CM

## 2012-11-27 LAB — URINALYSIS, ROUTINE W REFLEX MICROSCOPIC
Glucose, UA: >1000 mg/dL — AB
Ketones, ur: 40 mg/dL — AB
Leukocytes, UA: NEGATIVE
Nitrite: NEGATIVE
Specific Gravity, Urine: 1.023 (ref 1.005–1.030)
pH: 6 (ref 5.0–8.0)

## 2012-11-27 LAB — CBC
HCT: 28 % — ABNORMAL LOW (ref 36.0–46.0)
Hemoglobin: 8.4 g/dL — ABNORMAL LOW (ref 12.0–15.0)
MCHC: 30 g/dL (ref 30.0–36.0)
MCV: 77.8 fL — ABNORMAL LOW (ref 78.0–100.0)
RDW: 21.8 % — ABNORMAL HIGH (ref 11.5–15.5)
WBC: 8.8 10*3/uL (ref 4.0–10.5)

## 2012-11-27 LAB — LACTIC ACID, PLASMA: Lactic Acid, Venous: 1 mmol/L (ref 0.5–2.2)

## 2012-11-27 LAB — BASIC METABOLIC PANEL
BUN: 10 mg/dL (ref 6–23)
Chloride: 113 mEq/L — ABNORMAL HIGH (ref 96–112)
Creatinine, Ser: 0.95 mg/dL (ref 0.50–1.10)
GFR calc Af Amer: 83 mL/min — ABNORMAL LOW (ref >90)
Glucose, Bld: 144 mg/dL — ABNORMAL HIGH (ref 70–99)
Potassium: 3.7 mEq/L (ref 3.5–5.1)

## 2012-11-27 LAB — GLUCOSE, CAPILLARY
Glucose-Capillary: 164 mg/dL — ABNORMAL HIGH (ref 70–99)
Glucose-Capillary: 173 mg/dL — ABNORMAL HIGH (ref 70–99)

## 2012-11-27 LAB — URINE MICROSCOPIC-ADD ON

## 2012-11-27 MED ORDER — SODIUM CHLORIDE 0.9 % IV SOLN
INTRAVENOUS | Status: DC
  Administered 2012-11-27 – 2012-11-28 (×2): via INTRAVENOUS

## 2012-11-27 MED ORDER — INSULIN DETEMIR 100 UNIT/ML ~~LOC~~ SOLN
25.0000 [IU] | Freq: Every day | SUBCUTANEOUS | Status: DC
  Administered 2012-11-27 – 2012-11-28 (×2): 25 [IU] via SUBCUTANEOUS
  Filled 2012-11-27 (×2): qty 0.25

## 2012-11-27 MED ORDER — FERROUS SULFATE 325 (65 FE) MG PO TABS
325.0000 mg | ORAL_TABLET | Freq: Two times a day (BID) | ORAL | Status: DC
  Administered 2012-11-27 – 2012-11-28 (×3): 325 mg via ORAL
  Filled 2012-11-27 (×4): qty 1

## 2012-11-27 MED ORDER — SIMVASTATIN 10 MG PO TABS
10.0000 mg | ORAL_TABLET | Freq: Every day | ORAL | Status: DC
  Administered 2012-11-27: 10 mg via ORAL
  Filled 2012-11-27 (×2): qty 1

## 2012-11-27 MED ORDER — INSULIN ASPART 100 UNIT/ML ~~LOC~~ SOLN
3.0000 [IU] | Freq: Three times a day (TID) | SUBCUTANEOUS | Status: DC
  Administered 2012-11-28 (×2): 3 [IU] via SUBCUTANEOUS

## 2012-11-27 MED ORDER — ASPIRIN EC 81 MG PO TBEC
81.0000 mg | DELAYED_RELEASE_TABLET | Freq: Every day | ORAL | Status: DC
  Administered 2012-11-27 – 2012-11-28 (×2): 81 mg via ORAL
  Filled 2012-11-27 (×2): qty 1

## 2012-11-27 MED ORDER — SODIUM BICARBONATE 650 MG PO TABS
650.0000 mg | ORAL_TABLET | Freq: Two times a day (BID) | ORAL | Status: DC
  Administered 2012-11-27 – 2012-11-28 (×3): 650 mg via ORAL
  Filled 2012-11-27 (×4): qty 1

## 2012-11-27 NOTE — Progress Notes (Signed)
Inpatient Diabetes Program Recommendations  AACE/ADA: New Consensus Statement on Inpatient Glycemic Control (2013)  Target Ranges:  Prepandial:   less than 140 mg/dL      Peak postprandial:   less than 180 mg/dL (1-2 hours)      Critically ill patients:  140 - 180 mg/dL   Reason for Visit: F/U - DKA  Results for SHERITTA, [name] (MRN [id number]) as of 11/27/2012 15:08  Ref. Range 11/26/2012 11:18 11/26/2012 16:33 11/26/2012 22:24 11/27/2012 07:54 11/27/2012 10:51  Glucose-Capillary Latest Range: 70-99 mg/dL 102 (H) 725 (H) 366 (H) 164 (H) 262 (H)   Pt states she has been on Lantus in the past, but could not afford the copayment every month.  Therefore she was switched to 70/30 insulin, along with Novolog.  Case manager consult ordered for cost of Levemir copay with [employer].  Pt states she cannot afford $50/mo price of Lantus.  Recommendations:  Increase Novolog meal coverage to 6 units tidwc.  Will probably need titration until CBGs < 140 mg/dL.  Marland Kitchen      Note: Will continue to follow for needs.  If Levemir is not affordable, would recommend NPH 12 units bid.  Thank you. Ailene Ards, RD, LDN, CDE Inpatient Diabetes Coordinator [phone]

## 2012-11-27 NOTE — Progress Notes (Signed)
[facility] PROGRESS NOTE  Veera Stapleton OZH:[id number] DOB: 16-Jun-1966 DOA: 11/25/2012 PCP: Lonia Blood, MD  Brief narrative:  47 y/o female with uncontrolled type 1 DM ( A1C of 10.4) With neuropathy, ? gastroparesis, HTN admitted to stepdown with DKA and lethargy.   Assessment/Plan:  DKA  admitted stepdown on glucose stabilizer.  Gap resolved. Off insulin drip. patient  tachycardic and appears quite dehydrated and lethargic on presentation . Continue aggressive  IV fluids. Started on levemir 20 units and premeal aspart SS. Dose increased to 25 units today. reports missing her NPH doses at home as she is inconsistent with her meals and fears dropping her blood glucose.  Appreciate diabetes coordinator consult.  Low bicarb noted . Will order sodium bicarb tablets. Recheck lactate.  Nausea and vomiting  ? Diabetic gastroparesis  - on scheduled reglan and prn phenergan  Diarrhea resolved. CT abd negative. abdominal pain resolved. Diarrhea resolved.   Iron def anemia  on iron supplements  Hb stable at baseline  Vaginal; bleeding  reports vaginal bleeding since admission. menstrual period was only 2 weeks back. Denies trauma or use of contraceptives. No bleeding noted on exam. Recheck UA.   HTN  hold BP meds   DVT prophylaxis   Diet: advance to liquids   Code Status: full  Family Communication: none at bedside  Disposition Plan: transfer to tele  Consultants:  none Procedures:  none Antibiotics:  Flagyl 6/3-6/4  HPI/Subjective:  Still nauseous . No abdominal pain or diarrhea. Reports having a lot of stress at work.    Objective: Filed Vitals:   11/27/12 0400 11/27/12 0500 11/27/12 0600 11/27/12 0700  BP: 85/42 98/49 119/61 133/66  Pulse: 94 91 101 102  Temp: 98.5 F (36.9 C)     TempSrc: Oral     Resp: 13 14 13 14   Height:      Weight:      SpO2: 95% 96% 96% 96%    Intake/Output Summary (Last 24 hours) at 11/27/12 0802 Last data filed at 11/27/12  0700  Gross per 24 hour  Intake 2828.34 ml  Output    700 ml  Net 2128.34 ml   Filed Weights   11/25/12 1600  Weight: 59.6 kg (131 lb 6.3 oz)    Exam: General: Middle aged female appears fatigued , more alert today HEENT: no pallor, dry oral mucosa  Cardiovascular: S1&S2 tachycardic, no murmurs  Respiratory: clear b/l, no added sounds  Abdomen: soft, non distended, non tender BS+  Musculoskeletal: warm, no edema  CNS: AAOX3   Data Reviewed: Basic Metabolic Panel:  Recent Labs Lab 11/25/12 2330 11/26/12 0012 11/26/12 0320 11/26/12 0505 11/27/12 0330  NA 142 141 141 144 142  K 4.2 4.2 4.2 4.0 3.7  CL 112 112 113* 116* 113*  CO2 15* 16* 17* 18* 16*  GLUCOSE 173* 144* 130* 119* 144*  BUN 14 14 14 14 10   CREATININE 1.02 0.98 1.04 1.04 0.95  CALCIUM 9.0 9.1 8.7 8.8 8.1*   Liver Function Tests:  Recent Labs Lab 11/25/12 0950  AST 25  ALT 32  ALKPHOS 152*  BILITOT 0.7  PROT 8.1  ALBUMIN 3.5    Recent Labs Lab 11/25/12 0950  LIPASE 22   No results found for this basename: AMMONIA,  in the last 168 hours CBC:  Recent Labs Lab 11/25/12 0950 11/25/12 1713 11/26/12 0505 11/27/12 0330  WBC 14.4* 17.0* 14.1* 8.8  NEUTROABS 12.2*  --   --   --   HGB 10.9* 11.1*  10.0* 8.4*  HCT 36.8 36.7 32.1* 28.0*  MCV 78.5 80.3 77.9* 77.8*  PLT 415* 358 408* 342   Cardiac Enzymes: No results found for this basename: CKTOTAL, CKMB, CKMBINDEX, TROPONINI,  in the last 168 hours BNP (last 3 results)  Recent Labs  11/05/12 1821  PROBNP 158.2*   CBG:  Recent Labs Lab 11/26/12 0620 11/26/12 0734 11/26/12 1118 11/26/12 1633 11/26/12 2224  GLUCAP 114* 131* 274* 237* 113*    Recent Results (from the past 240 hour(s))  MRSA PCR SCREENING     Status: None   Collection Time    11/25/12  3:59 PM      Result Value Range Status   MRSA by PCR NEGATIVE  NEGATIVE Final   Comment:            The GeneXpert MRSA Assay (FDA     approved for NASAL specimens     only),  is one component of a     comprehensive MRSA colonization     surveillance program. It is not     intended to diagnose MRSA     infection nor to guide or     monitor treatment for     MRSA infections.     Studies: Ct Abdomen Pelvis W Contrast  11/25/2012   *RADIOLOGY REPORT*  Clinical Data: Abdominal pain.  Type 1 diabetic.  Nausea and vomiting.  CT ABDOMEN AND PELVIS WITH CONTRAST  Technique:  Multidetector CT imaging of the abdomen and pelvis was performed following the standard protocol during bolus administration of intravenous contrast.  Contrast: 80mL OMNIPAQUE IOHEXOL 300 MG/ML  SOLN  Comparison: 03/29/2010.  Findings: Lung Bases: Normal.  Liver:  Fatty infiltration adjacent to the falciform ligament. Otherwise normal.  Spleen:  Normal.  Gallbladder:  Cholecystectomy with clips in the fossa. Cholecystectomy with clips in the gallbladder fossa.  Common bile duct:  Normal.  Pancreas:  Normal.  Adrenal glands:  Normal.  Kidneys:  Normal enhancement.  Normal delayed excretion of contrast.  Both ureters appear within normal limits.  Stomach:  Patulous gastroesophageal junction.  Small bowel:  Normal duodenum.  Small bowel is decompressed.  No mesenteric adenopathy.  No inflammatory changes of small bowel.  Colon:   Normal appendix.  Majority of colon decompressed.  No inflammatory changes.  Pelvic Genitourinary:  Marked distention of the urinary bladder, with 16 cm cranial caudal dimension.  Although this is unusual, this appears similar to the prior exam of 2011.  Question chronic neurogenic bladder. Calcified and noncalcified uterine fibroids. Ovaries appear within normal limits.  No definite free fluid.  Bones:  No aggressive osseous lesions.  Vasculature: Minimal atherosclerosis.  No acute abnormality.  Body Wall: Normal.  IMPRESSION:  1.  No definite acute intra-abdominal abnormality. 2.  Marked distention of the urinary bladder.  This was present on the prior CT.  This raises possibility of  neurogenic bladder. 3.  Fibroid uterus. 4.  Cholecystectomy.   Original Report Authenticated By: Andreas Newport, M.D.   Dg Abd Acute W/chest  11/25/2012   *RADIOLOGY REPORT*  Clinical Data: Nausea, vomiting, and diarrhea.  ACUTE ABDOMEN SERIES (ABDOMEN 2 VIEW & CHEST 1 VIEW)  Comparison: Chest x-ray dated 11/05/2012 and abdominal radiograph dated 08/07/2012  Findings: The heart and lungs are normal.  There is no free air in the abdomen.  The bowel is almost completely devoid of bowel gas. No appreciable dilated loops of large or small bowel.  No acute osseous abnormality. Previous cholecystectomy.  IMPRESSION: Benign-appearing  abdomen and chest.   Original Report Authenticated By: Francene Boyers, M.D.    Scheduled Meds: . aspirin EC  81 mg Oral Daily  . enoxaparin (LOVENOX) injection  40 mg Subcutaneous Q24H  . ferrous sulfate  325 mg Oral BID  . insulin aspart  0-15 Units Subcutaneous TID WC  . insulin detemir  25 Units Subcutaneous Daily  . metoCLOPramide (REGLAN) injection  10 mg Intravenous Q6H  . pregabalin  150 mg Oral BID  . simvastatin  10 mg Oral q1800   Continuous Infusions: . sodium chloride 125 mL/hr at 11/27/12 0248     Time spent:35    Eddie North  [facility] Pager [phone]. If 7PM-7AM, please contact night-coverage at [email or web address], password [facility] 11/27/2012, 8:02 AM  LOS: 2 days

## 2012-11-28 DIAGNOSIS — E876 Hypokalemia: Secondary | ICD-10-CM

## 2012-11-28 LAB — CBC
HCT: 28.6 % — ABNORMAL LOW (ref 36.0–46.0)
MCH: 23.7 pg — ABNORMAL LOW (ref 26.0–34.0)
MCV: 76.9 fL — ABNORMAL LOW (ref 78.0–100.0)
RDW: 21.6 % — ABNORMAL HIGH (ref 11.5–15.5)
WBC: 5.7 10*3/uL (ref 4.0–10.5)

## 2012-11-28 LAB — BASIC METABOLIC PANEL
BUN: 8 mg/dL (ref 6–23)
CO2: 20 mEq/L (ref 19–32)
Calcium: 8.1 mg/dL — ABNORMAL LOW (ref 8.4–10.5)
Chloride: 111 mEq/L (ref 96–112)
Creatinine, Ser: 0.82 mg/dL (ref 0.50–1.10)
GFR calc Af Amer: >90 mL/min (ref >90)

## 2012-11-28 MED ORDER — POTASSIUM CHLORIDE ER 20 MEQ PO TBCR: 20.0000 meq | EXTENDED_RELEASE_TABLET | Freq: Every day | ORAL | Status: DC

## 2012-11-28 MED ORDER — INSULIN ASPART 100 UNIT/ML ~~LOC~~ SOLN: 0.0000 [IU] | Freq: Three times a day (TID) | SUBCUTANEOUS | Status: DC

## 2012-11-28 MED ORDER — SODIUM BICARBONATE 650 MG PO TABS: 650.0000 mg | ORAL_TABLET | Freq: Two times a day (BID) | ORAL | Status: DC

## 2012-11-28 MED ORDER — METOCLOPRAMIDE HCL 5 MG/ML IJ SOLN: 10.0000 mg | Freq: Four times a day (QID) | INTRAMUSCULAR | Status: DC

## 2012-11-28 MED ORDER — INSULIN DETEMIR 100 UNIT/ML ~~LOC~~ SOLN: 20.0000 [IU] | Freq: Every day | SUBCUTANEOUS | Status: DC

## 2012-11-28 MED ORDER — POTASSIUM CHLORIDE CRYS ER 20 MEQ PO TBCR: 40.0000 meq | EXTENDED_RELEASE_TABLET | Freq: Once | ORAL | Status: DC

## 2012-11-28 MED ORDER — POTASSIUM CHLORIDE CRYS ER 20 MEQ PO TBCR
40.0000 meq | EXTENDED_RELEASE_TABLET | Freq: Once | ORAL | Status: AC
Start: 2012-11-28 — End: 2012-11-28
  Administered 2012-11-28: 40 meq via ORAL
  Filled 2012-11-28: qty 2

## 2012-11-28 NOTE — Discharge Summary (Addendum)
Physician Discharge Summary  [name] XLK:[id number] DOB: 1965-12-18 DOA: 11/25/2012  PCP: Lonia Blood, MD  Admit date: 11/25/2012 Discharge date: 11/28/2012  Time spent: 40  minutes  Discharge home with outpt PCP follow up. Check BMET for k and bicarbonate levels in 1 week  Discharge Diagnoses:   Principal Problem:   DKA (diabetic ketoacidoses)  Active Problems:   DIABETES MELLITUS, TYPE I   Nausea and vomiting   Microcytic anemia   Abdominal pain   Hypertension   Hypokalemia   Discharge Condition: fair  Diet recommendation: diabetic  Filed Weights   11/25/12 1600  Weight: 59.6 kg (131 lb 6.3 oz)    History of present illness:  Please refer to admission H&P for details, but in brief, 47 y/o female with uncontrolled type 1 DM ( A1C of 10.4) With neuropathy, ? gastroparesis, HTN who presented with  nausea, vomiting and abdominal pain for almost 2 days associated with diarrhea as well.   Patient found to be dehydrated and lethargic with   DKA and admitted to stepdown.    Hospital Course:   DKA  admitted stepdown on glucose stabilizer.  Gap resolved. Off insulin drip. aggressive IV fluids given for tachycardia and dehydration.. Started on levemir 20 units and premeal aspart SS. Dose increased to 25 units but had low FSG overnight. reports missing her NPH doses at home as she is inconsistent with her meals and fears dropping her blood glucose.  Appreciate diabetes coordinator consult.  Low bicarb noted . Improved with  sodium bicarb tablets Patient reports not being on long acting isulin due to high copay. Verified with CM and she has only $10 co pay with levemir. i will discharge her on 20 units levemir and premeal sliding scale insulin which she is comfortable to use.  Nausea and vomiting  Possibly due to  DKA and ? Diabetic gastroparesis  - placedon scheduled reglan and prn phenergan  Diarrhea resolved. CT abd negative. abdominal pain resolved.  -symptoms improved  and tolerating  advanced diet  Iron def anemia  on iron supplements  Hb stable close to baseline  Vaginal bleeding  reported vaginal bleeding on admission. menstrual period was only 2 weeks back. Denies trauma or use of contraceptives. No bleeding noted on exam. Rechecked UA which showed large hemoglobin. No signs of UTI. Denies further bleeding. Follow up with PCP.  HTN  Resume BP meds on discharge   Hypokalemia  replenished and will discharge on some supplements. Follow labs as outpt   Patient stable for discharge home with PCP follow up  Consultants:  none Procedures:  none Antibiotics:  Flagyl 6/3-6/4    Discharge Exam: Filed Vitals:   11/27/12 0800 11/27/12 2130 11/28/12 0158 11/28/12 0510  BP: 142/78 107/56 114/54 113/57  Pulse: 106 57 87 85  Temp: 99.4 F (37.4 C) 97.8 F (36.6 C) 98.4 F (36.9 C) 98.2 F (36.8 C)  TempSrc: Oral Oral Oral Oral  Resp: 16 16 14 14   Height:      Weight:      SpO2: 98% 100% 99% 98%   General: Middle aged female in NAD HEENT: no pallor, dry oral mucosa  Cardiovascular: normal S1&S2, no murmurs  Respiratory: clear b/l, no added sounds  Abdomen: soft, non distended, non tender BS+ , no vaginal bleeding Musculoskeletal: warm, no edema  CNS: AAOX3    Discharge Instructions     Medication List    STOP taking these medications       insulin aspart protamine- aspart (  70-30) 100 UNIT/ML injection  Commonly known as:  NOVOLOG 70/30      TAKE these medications       aspirin 81 MG EC tablet  Take 1 tablet (81 mg total) by mouth daily.     ferrous sulfate 325 (65 FE) MG tablet  Take 325 mg by mouth 2 (two) times daily.     furosemide 20 MG tablet  Commonly known as:  LASIX  Take 2 tablets (40 mg total) by mouth 2 (two) times daily.     insulin aspart 100 UNIT/ML injection  Commonly known as:  novoLOG  Inject 0-15 Units into the skin 3 (three) times daily with meals.     insulin detemir 100 UNIT/ML injection   Commonly known as:  LEVEMIR  Inject 0.2 mLs (20 Units total) into the skin at bedtime.     lisinopril 10 MG tablet  Commonly known as:  PRINIVIL,ZESTRIL  Take 10 mg by mouth every morning.     metoCLOPramide 5 MG/ML injection  Commonly known as:  REGLAN  Inject 2 mLs (10 mg total) into the vein every 6 (six) hours.     Potassium Chloride ER 20 MEQ Tbcr  Take 20 mEq by mouth daily.     pravastatin 20 MG tablet  Commonly known as:  PRAVACHOL  Take 20 mg by mouth at bedtime.     pregabalin 150 MG capsule  Commonly known as:  LYRICA  Take 150 mg by mouth 2 (two) times daily.     promethazine 25 MG tablet  Commonly known as:  PHENERGAN  Take 25 mg by mouth every 6 (six) hours as needed. For nausea.     sodium bicarbonate 650 MG tablet  Take 1 tablet (650 mg total) by mouth 2 (two) times daily.       Allergies  Allergen Reactions  . Anesthetics, Amide Nausea And Vomiting  . Morphine And Related Rash       Follow-up Information   Follow up with [facility], MD In 1 week. (Check BMET for, k and bicarb.)    Contact information:   [address] [place] [address]        The results of significant diagnostics from this hospitalization (including imaging, microbiology, ancillary and laboratory) are listed below for reference.    Significant Diagnostic Studies: Ct Abdomen Pelvis W Contrast  11/25/2012   *RADIOLOGY REPORT*  Clinical Data: Abdominal pain.  Type 1 diabetic.  Nausea and vomiting.  CT ABDOMEN AND PELVIS WITH CONTRAST  Technique:  Multidetector CT imaging of the abdomen and pelvis was performed following the standard protocol during bolus administration of intravenous contrast.  Contrast: 80mL OMNIPAQUE IOHEXOL 300 MG/ML  SOLN  Comparison: 03/29/2010.  Findings: Lung Bases: Normal.  Liver:  Fatty infiltration adjacent to the falciform ligament. Otherwise normal.  Spleen:  Normal.  Gallbladder:  Cholecystectomy with clips in the fossa. Cholecystectomy with clips  in the gallbladder fossa.  Common bile duct:  Normal.  Pancreas:  Normal.  Adrenal glands:  Normal.  Kidneys:  Normal enhancement.  Normal delayed excretion of contrast.  Both ureters appear within normal limits.  Stomach:  Patulous gastroesophageal junction.  Small bowel:  Normal duodenum.  Small bowel is decompressed.  No mesenteric adenopathy.  No inflammatory changes of small bowel.  Colon:   Normal appendix.  Majority of colon decompressed.  No inflammatory changes.  Pelvic Genitourinary:  Marked distention of the urinary bladder, with 16 cm cranial caudal dimension.  Although this is unusual,  this appears similar to the prior exam of 2011.  Question chronic neurogenic bladder. Calcified and noncalcified uterine fibroids. Ovaries appear within normal limits.  No definite free fluid.  Bones:  No aggressive osseous lesions.  Vasculature: Minimal atherosclerosis.  No acute abnormality.  Body Wall: Normal.  IMPRESSION:  1.  No definite acute intra-abdominal abnormality. 2.  Marked distention of the urinary bladder.  This was present on the prior CT.  This raises possibility of neurogenic bladder. 3.  Fibroid uterus. 4.  Cholecystectomy.   Original Report Authenticated By: Andreas Newport, M.D.   Dg Chest Port 1 View  11/05/2012   *RADIOLOGY REPORT*  Clinical Data: Evaluate for infiltrates.  PORTABLE CHEST - 1 VIEW  Comparison: 06/25/2012.  Findings: Trachea is midline.  Heart size normal.  Lungs are clear. No pleural fluid.  IMPRESSION: No acute findings.   Original Report Authenticated By: Leanna Battles, M.D.   Dg Abd Acute W/chest  11/25/2012   *RADIOLOGY REPORT*  Clinical Data: Nausea, vomiting, and diarrhea.  ACUTE ABDOMEN SERIES (ABDOMEN 2 VIEW & CHEST 1 VIEW)  Comparison: Chest x-ray dated 11/05/2012 and abdominal radiograph dated 08/07/2012  Findings: The heart and lungs are normal.  There is no free air in the abdomen.  The bowel is almost completely devoid of bowel gas. No appreciable dilated loops of  large or small bowel.  No acute osseous abnormality. Previous cholecystectomy.  IMPRESSION: Benign-appearing abdomen and chest.   Original Report Authenticated By: Francene Boyers, M.D.    Microbiology: Recent Results (from the past 240 hour(s))  CULTURE, BLOOD (ROUTINE X 2)     Status: None   Collection Time    11/25/12  2:35 PM      Result Value Range Status   Specimen Description BLOOD  LEFT HAND   Final   Special Requests NONE BOTTLES DRAWN AEROBIC ONLY 3CC   Final   Culture  Setup Time 11/26/2012 01:19   Final   Culture     Final   Value:        BLOOD CULTURE RECEIVED NO GROWTH TO DATE CULTURE WILL BE HELD FOR 5 DAYS BEFORE ISSUING A FINAL NEGATIVE REPORT   Report Status PENDING   Incomplete  MRSA PCR SCREENING     Status: None   Collection Time    11/25/12  3:59 PM      Result Value Range Status   MRSA by PCR NEGATIVE  NEGATIVE Final   Comment:            The GeneXpert MRSA Assay (FDA     approved for NASAL specimens     only), is one component of a     comprehensive MRSA colonization     surveillance program. It is not     intended to diagnose MRSA     infection nor to guide or     monitor treatment for     MRSA infections.     Labs: Basic Metabolic Panel:  Recent Labs Lab 11/26/12 0012 11/26/12 0320 11/26/12 0505 11/27/12 0330 11/28/12 0440  NA 141 141 144 142 139  K 4.2 4.2 4.0 3.7 3.2*  CL 112 113* 116* 113* 111  CO2 16* 17* 18* 16* 20  GLUCOSE 144* 130* 119* 144* 54*  BUN 14 14 14 10 8   CREATININE 0.98 1.04 1.04 0.95 0.82  CALCIUM 9.1 8.7 8.8 8.1* 8.1*   Liver Function Tests:  Recent Labs Lab 11/25/12 0950  AST 25  ALT 32  ALKPHOS 152*  BILITOT 0.7  PROT 8.1  ALBUMIN 3.5    Recent Labs Lab 11/25/12 0950  LIPASE 22   No results found for this basename: AMMONIA,  in the last 168 hours CBC:  Recent Labs Lab 11/25/12 0950 11/25/12 1713 11/26/12 0505 11/27/12 0330 11/28/12 0440  WBC 14.4* 17.0* 14.1* 8.8 5.7  NEUTROABS 12.2*  --   --    --   --   HGB 10.9* 11.1* 10.0* 8.4* 8.8*  HCT 36.8 36.7 32.1* 28.0* 28.6*  MCV 78.5 80.3 77.9* 77.8* 76.9*  PLT 415* 358 408* 342 PLATELET CLUMPS NOTED ON SMEAR, COUNT APPEARS ADEQUATE   Cardiac Enzymes: No results found for this basename: CKTOTAL, CKMB, CKMBINDEX, TROPONINI,  in the last 168 hours BNP: BNP (last 3 results)  Recent Labs  11/05/12 1821  PROBNP 158.2*   CBG:  Recent Labs Lab 11/27/12 1051 11/27/12 1718 11/27/12 2132 11/28/12 0757 11/28/12 1201  GLUCAP 262* 112* 173* 118* 179*       Signed:  Camryn Lampson  [facility] 11/28/2012, 12:46 PM

## 2012-11-28 NOTE — Progress Notes (Signed)
Consult received for me to check on copayment for Levimir, did a benefit check and was informed it would be $10.00 for 30 day supply. Pt made aware of this.  Algernon Huxley RN BSN  [phone]

## 2012-11-28 NOTE — Progress Notes (Signed)
Inpatient Diabetes Program Recommendations  AACE/ADA: New Consensus Statement on Inpatient Glycemic Control (2013)  Target Ranges:  Prepandial:   less than 140 mg/dL      Peak postprandial:   less than 180 mg/dL (1-2 hours)      Critically ill patients:  140 - 180 mg/dL   Reason for Visit: Hypoglycemia   Results for JAONNA, WORD (MRN [id number]) as of 11/28/2012 10:02  Ref. Range 11/28/2012 04:40  Glucose Latest Range: 70-99 mg/dL 54 (L)     Results for SHI, GROSE (MRN [id number]) as of 11/28/2012 10:02  Ref. Range 11/27/2012 07:54 11/27/2012 10:51 11/27/2012 17:18 11/27/2012 21:32 11/28/2012 07:57  Glucose-Capillary Latest Range: 70-99 mg/dL 086 (H) 578 (H) 469 (H) 173 (H) 118 (H)   Hypoglycemia in early am.    Recommendation:  Decrease Levemir to 20 units QD. Would also increase meal coverage insulin - Novolog 6 units tidwc if pt eats >50% meal. Awaiting case mgmt to verify pt's copay on Levemir with [employer].  Thank you. Ailene Ards, RD, LDN, CDE Inpatient Diabetes Coordinator [phone]

## 2012-12-02 LAB — CULTURE, BLOOD (ROUTINE X 2): Culture: NO GROWTH

## 2012-12-19 ENCOUNTER — Telehealth: Payer: Self-pay | Admitting: Gastroenterology

## 2012-12-19 NOTE — Telephone Encounter (Signed)
Spoke with [place] and scheduled patient on 12/22/12 at 10:00 AM. Mechele Dawley will fax records.

## 2012-12-22 ENCOUNTER — Ambulatory Visit (INDEPENDENT_AMBULATORY_CARE_PROVIDER_SITE_OTHER): Payer: 59 | Admitting: Nurse Practitioner

## 2012-12-22 VITALS — BP 100/60 | HR 80 | Ht 62.0 in | Wt 141.0 lb

## 2012-12-22 DIAGNOSIS — R112 Nausea with vomiting, unspecified: Secondary | ICD-10-CM

## 2012-12-22 DIAGNOSIS — D509 Iron deficiency anemia, unspecified: Secondary | ICD-10-CM

## 2012-12-22 DIAGNOSIS — K5900 Constipation, unspecified: Secondary | ICD-10-CM

## 2012-12-22 DIAGNOSIS — E109 Type 1 diabetes mellitus without complications: Secondary | ICD-10-CM

## 2012-12-22 MED ORDER — ERYTHROMYCIN ETHYLSUCCINATE 200 MG/5ML PO SUSR: ORAL | Status: DC

## 2012-12-22 MED ORDER — LINACLOTIDE 145 MCG PO CAPS: 145.0000 ug | ORAL_CAPSULE | Freq: Every day | ORAL | Status: DC

## 2012-12-22 NOTE — Progress Notes (Signed)
HPI :  Patient is a 47 year old female with a history of type 1 diabetes since age 40.  She is referred by PCP for evaluation of nausea. Patient seen by Dr. Arlyce Dice in 2010 for evaluation of nausea and vomiting. Endoscopy was normal. She has a history of gastroparesis.   Patient was hospitalized for a few days earlier this month for DKA with associated nausea, vomiting, and abdominal pain. She also had loose stool. Since discharge she has had persistent nausea twice or so a week with infrequent vomiting. Patient started daily baby asa upon discharge, no other NSAIDS. She is on a daily PPI. Patient has noticed a correlation between hyperglycemia and nausea. Phenergan makes her too groggy.  Reglan causes slurred speech. CBGs better, averaging between 160-170. Marland Kitchen  Patient reports chronic constipation. PCP started her on twice daily iron a few months back but only able to tolerate it TIW because of constipation. She takes stool softeners as needed. Dulcolax "tears up her stomach". No blood in stool, she is still menstruating but cycles not unusually heavy.   Past Medical History  Diagnosis Date  . Diabetes mellitus     diagnosed age 8  . Hypertension   . Cataract   . Anxiety   . Anemia   . GERD (gastroesophageal reflux disease)   . Constipation 04/03/2012  . Complication of anesthesia   . PONV (postoperative nausea and vomiting)   . Asthma     IN CHILDHOOD  . Neuromuscular disorder     Diabetic neuropathy  . Gastroparesis   . Diabetic neuropathy     Family History  Problem Relation Age of Onset  . Cystic fibrosis Mother   . Hypertension Father   . Diabetes Brother   . Hypertension Maternal Grandmother    History  Substance Use Topics  . Smoking status: Never Smoker   . Smokeless tobacco: Never Used  . Alcohol Use: Yes     Comment: occas  wine   Current Outpatient Prescriptions  Medication Sig Dispense Refill  . aspirin EC 81 MG EC tablet Take 1 tablet (81 mg total) by mouth daily.       . ferrous sulfate 325 (65 FE) MG tablet Take 325 mg by mouth 2 (two) times daily.      . furosemide (LASIX) 20 MG tablet Take 2 tablets (40 mg total) by mouth 2 (two) times daily.  30 tablet  0  . insulin aspart (NOVOLOG) 100 UNIT/ML injection Inject 0-15 Units into the skin 3 (three) times daily with meals.  1 vial  12  . insulin detemir (LEVEMIR) 100 UNIT/ML injection Inject 0.2 mLs (20 Units total) into the skin at bedtime.  10 mL  3  . lisinopril (PRINIVIL,ZESTRIL) 10 MG tablet Take 10 mg by mouth every morning.       . pregabalin (LYRICA) 150 MG capsule Take 150 mg by mouth 2 (two) times daily.      . promethazine (PHENERGAN) 25 MG tablet Take 25 mg by mouth every 6 (six) hours as needed. For nausea.       No current facility-administered medications for this visit.   Allergies  Allergen Reactions  . Anesthetics, Amide Nausea And Vomiting  . Morphine And Related Rash     Review of Systems: All systems reviewed and negative except where noted in HPI.    Ct Abdomen Pelvis W Contrast  11/25/2012   *RADIOLOGY REPORT*  Clinical Data: Abdominal pain.  Type 1 diabetic.  Nausea and vomiting.  CT  ABDOMEN AND PELVIS WITH CONTRAST  Technique:  Multidetector CT imaging of the abdomen and pelvis was performed following the standard protocol during bolus administration of intravenous contrast.  Contrast: 80mL OMNIPAQUE IOHEXOL 300 MG/ML  SOLN  Comparison: 03/29/2010.  Findings: Lung Bases: Normal.  Liver:  Fatty infiltration adjacent to the falciform ligament. Otherwise normal.  Spleen:  Normal.  Gallbladder:  Cholecystectomy with clips in the fossa. Cholecystectomy with clips in the gallbladder fossa.  Common bile duct:  Normal.  Pancreas:  Normal.  Adrenal glands:  Normal.  Kidneys:  Normal enhancement.  Normal delayed excretion of contrast.  Both ureters appear within normal limits.  Stomach:  Patulous gastroesophageal junction.  Small bowel:  Normal duodenum.  Small bowel is decompressed.  No  mesenteric adenopathy.  No inflammatory changes of small bowel.  Colon:   Normal appendix.  Majority of colon decompressed.  No inflammatory changes.  Pelvic Genitourinary:  Marked distention of the urinary bladder, with 16 cm cranial caudal dimension.  Although this is unusual, this appears similar to the prior exam of 2011.  Question chronic neurogenic bladder. Calcified and noncalcified uterine fibroids. Ovaries appear within normal limits.  No definite free fluid.  Bones:  No aggressive osseous lesions.  Vasculature: Minimal atherosclerosis.  No acute abnormality.  Body Wall: Normal.  IMPRESSION:  1.  No definite acute intra-abdominal abnormality. 2.  Marked distention of the urinary bladder.  This was present on the prior CT.  This raises possibility of neurogenic bladder. 3.  Fibroid uterus. 4.  Cholecystectomy.   Original Report Authenticated By: Andreas Newport, M.D.   Dg Abd Acute W/chest  11/25/2012   *RADIOLOGY REPORT*  Clinical Data: Nausea, vomiting, and diarrhea.  ACUTE ABDOMEN SERIES (ABDOMEN 2 VIEW & CHEST 1 VIEW)  Comparison: Chest x-ray dated 11/05/2012 and abdominal radiograph dated 08/07/2012  Findings: The heart and lungs are normal.  There is no free air in the abdomen.  The bowel is almost completely devoid of bowel gas. No appreciable dilated loops of large or small bowel.  No acute osseous abnormality. Previous cholecystectomy.  IMPRESSION: Benign-appearing abdomen and chest.   Original Report Authenticated By: Francene Boyers, M.D.    Physical Exam: BP 100/60  Pulse 80  Ht 5\' 2"  (1.575 m)  Wt 141 lb (63.957 kg)  BMI 25.78 kg/m2  LMP 11/03/2012 Constitutional: Peasant,well-developed, black female in no acute distress. HEENT: Normocephalic and atraumatic. Conjunctivae are normal. No scleral icterus. Neck supple.  Cardiovascular: Normal rate, regular rhythm.  Pulmonary/chest: Effort normal and breath sounds normal. No wheezing, rales or rhonchi. Abdominal: Soft, nondistended,  nontender. Negative succussion splash. Bowel sounds active throughout. There are no masses palpable. No hepatomegaly. Extremities: no edema Lymphadenopathy: No cervical adenopathy noted. Neurological: Alert and oriented to person place and time. Skin: Skin is warm and dry. No rashes noted. Psychiatric: Normal mood and affect. Behavior is normal.   ASSESSMENT AND PLAN:  1. Nausea, vomiting. Suspect diabetic gastroparesis. Reglan causes slurred sppech. Phenergan causes excessive drowsiness. Will try liquid Erythromycin. Starting dose will be 125mg  TID. Recommend small, frequent meals. Good blood sugar control. Patient will call in a week or so with condition update and further recommendations depending clinical course.  .  2. Acute on chronic constipation, exacerbated by iron.  Last normal BM 2 weeks ago. Gave her 5 days of Linzess to jumpstart things. Following that she will try Miralax 1-2 times a days.    3. Microcytic anemia. Baseline hgb 10-11 range.  It was 10.9 when presenting  to hospital on 11/25/12. Hgb fell to 8.8 after hydration. Patient still menstruating. PCP started her on iron a few months back but constipation precludes her from tolerating it more than TIW. No blood in stool, no weight loss. PCP monitoring labs, if hgb trending down patient may need endoscopic workup. Will reevaluate anemia at time of follow up visit with Dr. Arlyce Dice. Patient would be unable to tolerate a bowel prep at this point anyway due to nausea and vomiting.  Marland Kitchen

## 2012-12-22 NOTE — Patient Instructions (Addendum)
We have sent the following medications to your pharmacy for you to pick up at your convenience: Erythromycin. Please call back in 3 days to let us know if this is working because we may need to increase the dose.   Please eat small frequent meals.   Start Linzess samples one tablet by mouth once daily until finished. Then start over the counter Miralax mixing 17 grams in 8 oz of water 1-2 x daily to titrate.   Keep your blood sugar under control.   We have scheduled your follow up appt with Dr. Arlyce Dice on 01/23/13 at 1:45pm. Please call [phone] if you need to cancel or reschedule.   cc: Lonia Blood, MD

## 2012-12-28 NOTE — Progress Notes (Signed)
Reviewed and agree with management. Robert D. Kaplan, M.D., FACG  

## 2013-01-17 IMAGING — CR DG ABDOMEN ACUTE W/ 1V CHEST
3 series · 3 of 3 positions shown · non-contrast
Comparison: 03/23/2012 and earlier.

CLINICAL DATA: 45-year-old female abdominal pain at the umbilicus.
Nausea vomiting constipation.

ACUTE ABDOMEN SERIES (ABDOMEN 2 VIEW & CHEST 1 VIEW)

[w chest pa]
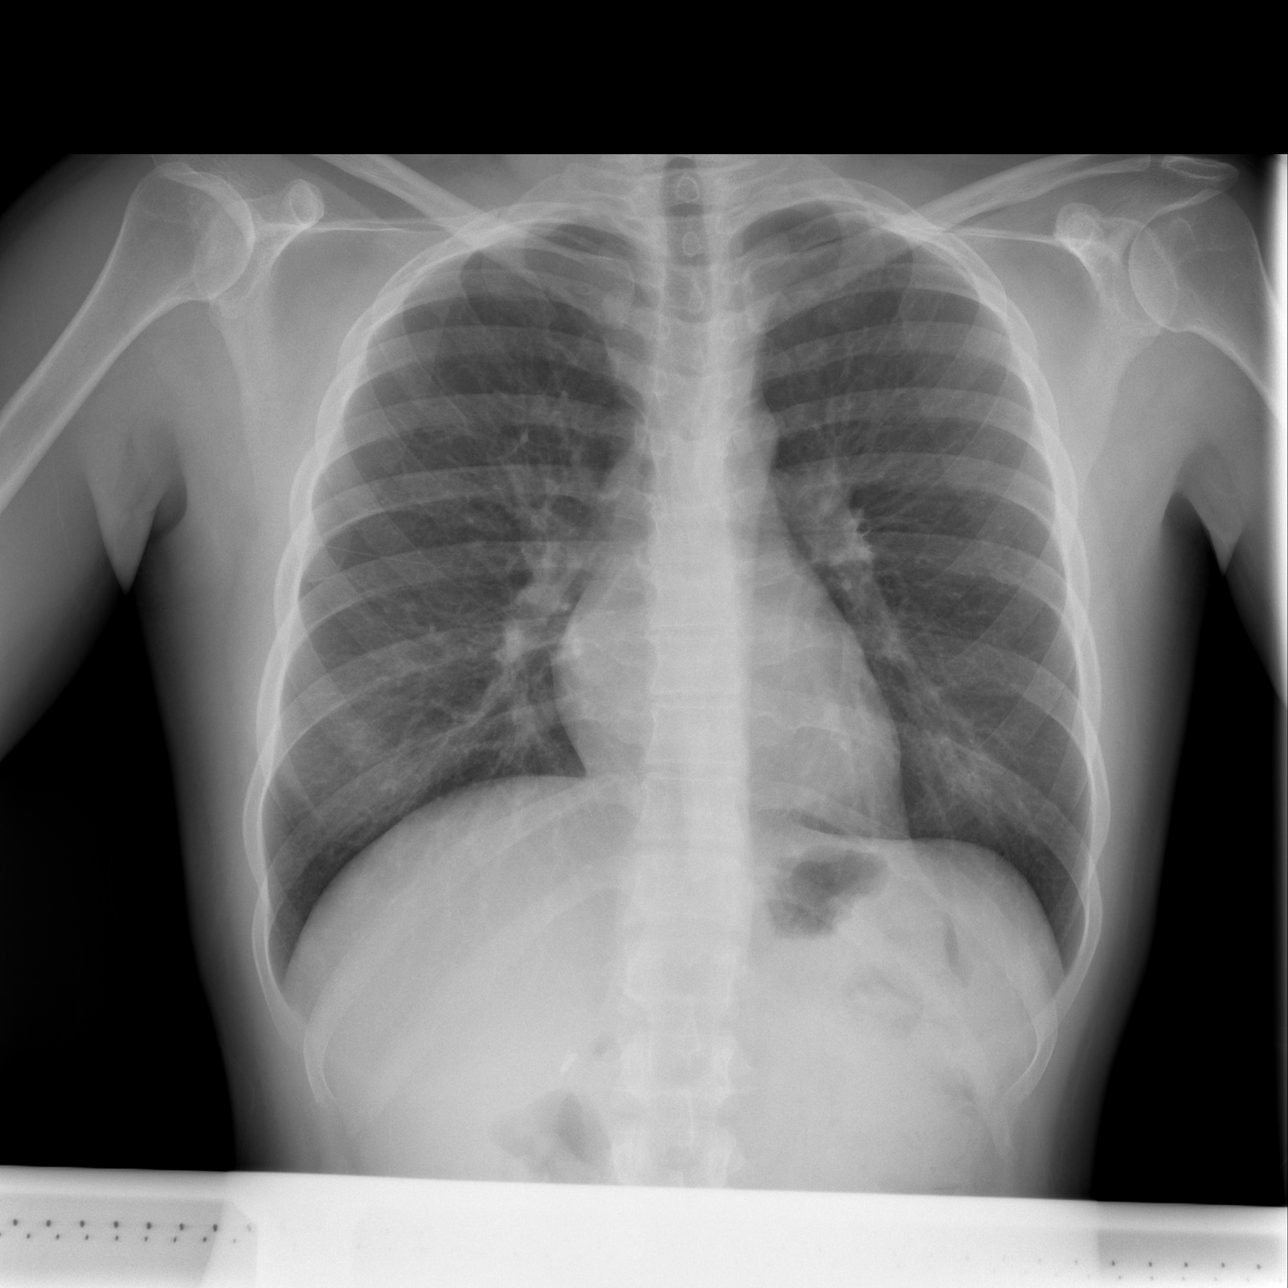

[w abdomen upright]
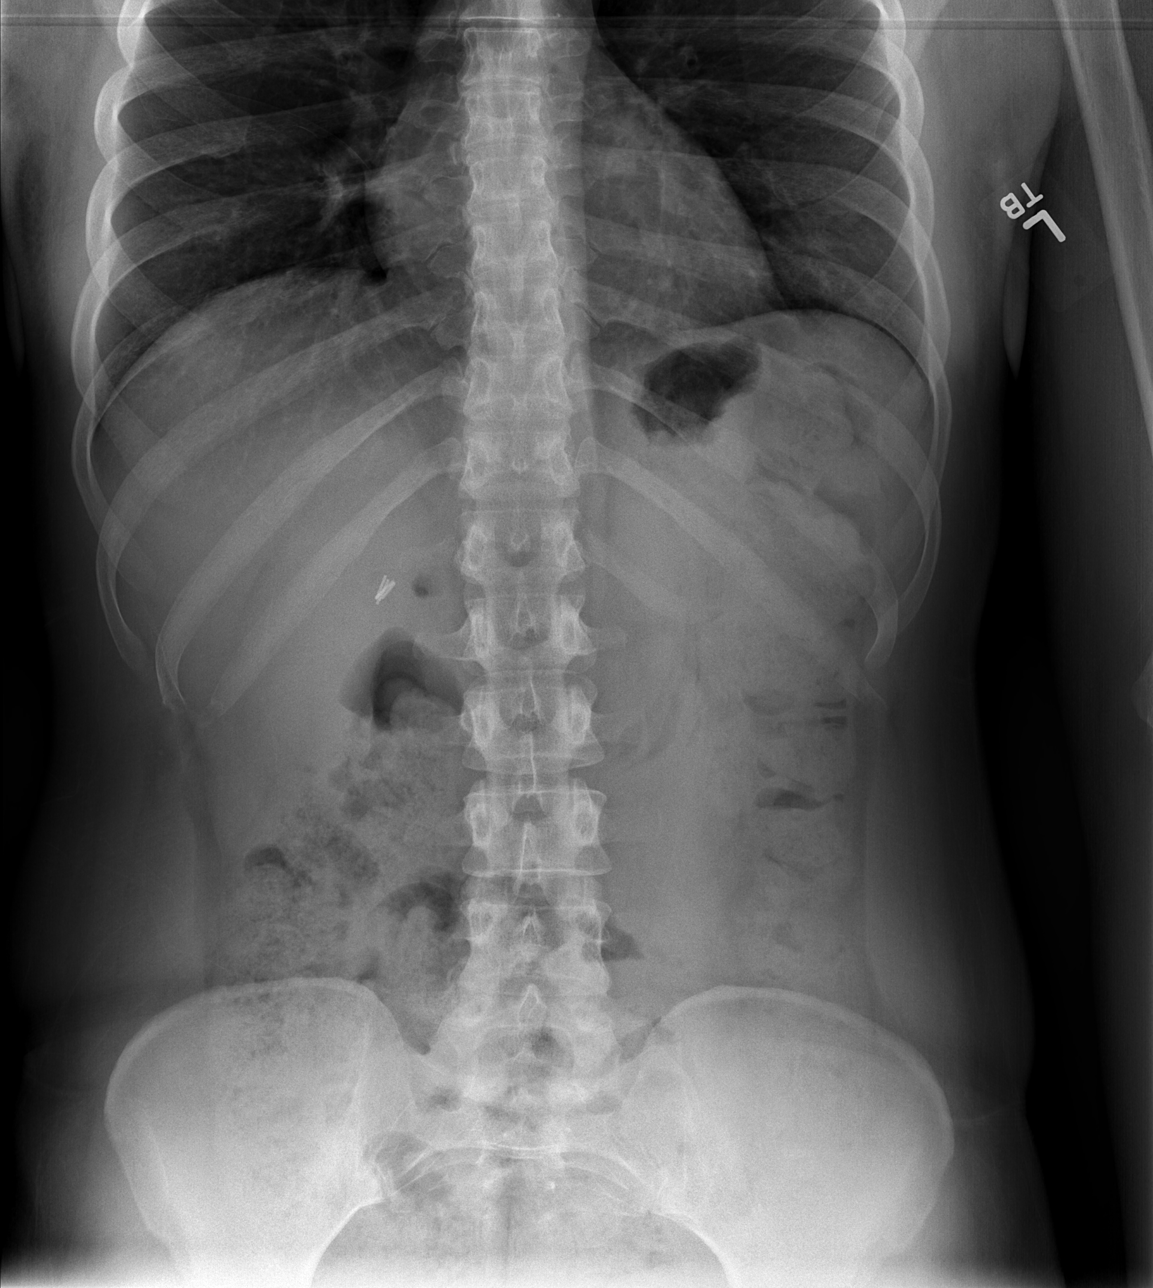

[t abdomen supine]
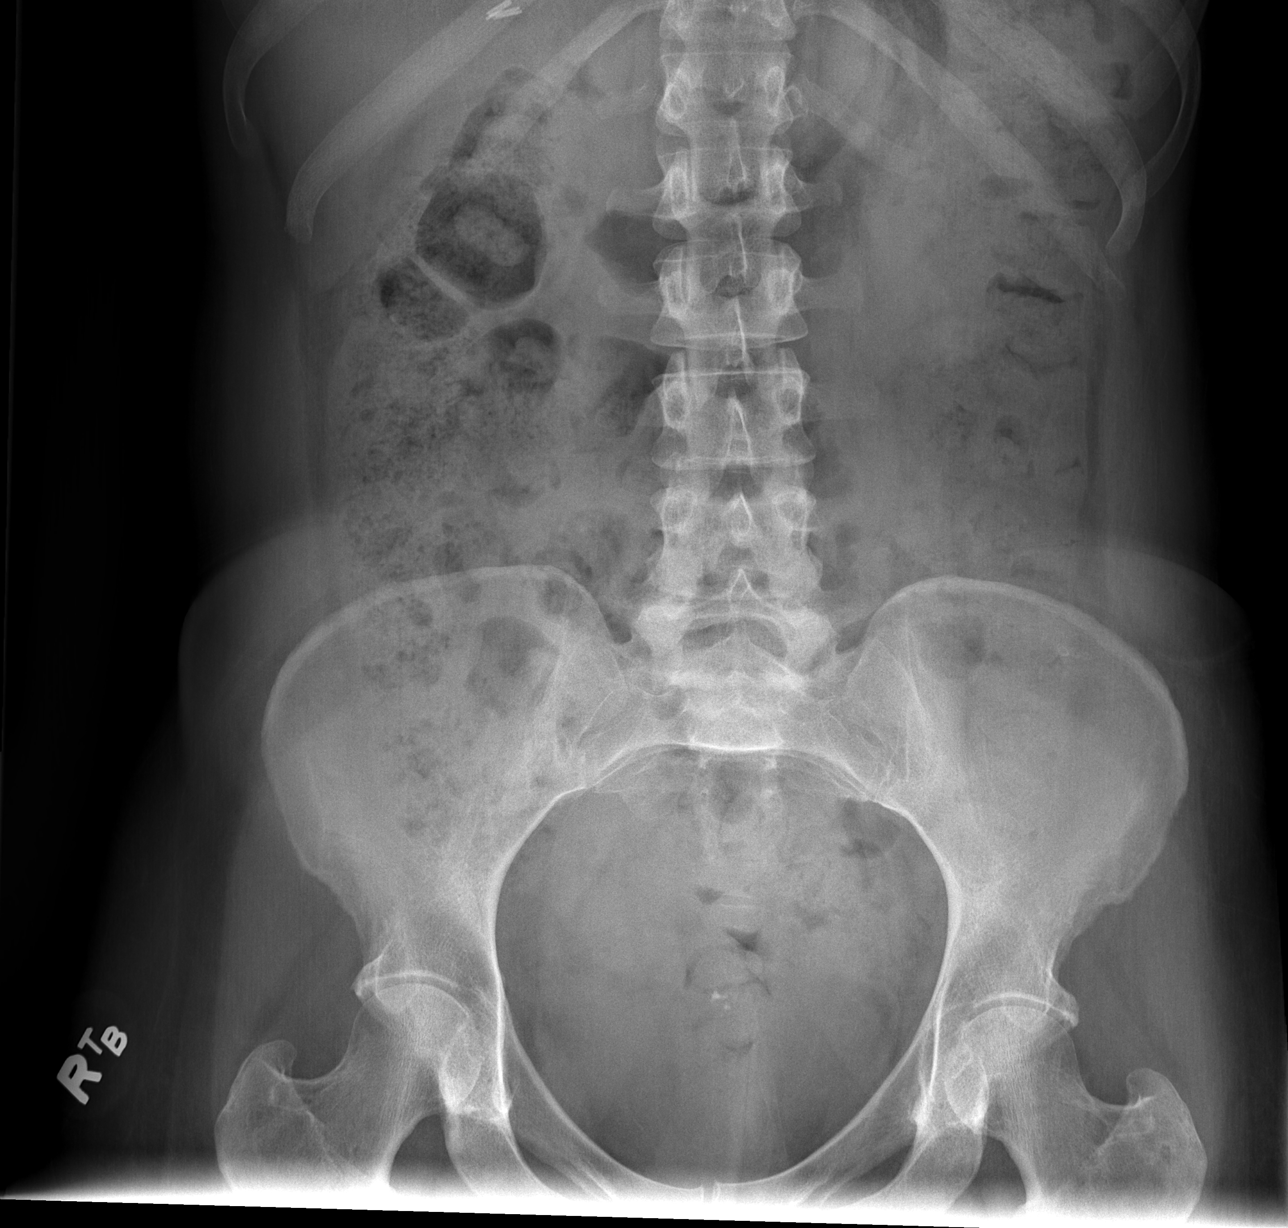

[3 of 3 positions shown; findings below may reference images not displayed]

FINDINGS: Normal lung volumes. Normal cardiac size and mediastinal
contours.  Visualized tracheal air column is within normal limits.
No pneumothorax or pneumoperitoneum.  Lungs remain clear.

Right upper quadrant surgical clips. Nonobstructed bowel gas
pattern.  Retained stool throughout the colon.  Other abdominal and
pelvic visceral contours are within normal limits. No acute osseous
abnormality identified.
IMPRESSION: 1. Nonobstructed bowel gas pattern, no free air.  Retained stool
throughout colon.
2.  Negative chest.

## 2013-01-18 IMAGING — CT CT ANGIO CHEST
2 of 10 series · 15 of 36 positions shown · IV contrast (CONTRAST)
Comparison: Chest radiographs 03/29/2010 and 04/02/2012.  Abdominal
CT 03/29/2010.

CLINICAL DATA: Chest pain with shortness of breath on exertion and
elevated D-dimer levels.  Question pulmonary embolism.

CT ANGIOGRAPHY CHEST
TECHNIQUE: Multidetector CT imaging of the chest using the
standard protocol during bolus administration of intravenous
contrast. Multiplanar reconstructed images including MIPs were
obtained and reviewed to evaluate the vascular anatomy.
Contrast: 60mL OMNIPAQUE IOHEXOL 350 MG/ML SOLN

[Series 5: pe thins · axial · 0.68mm/px · z∈[-162,+39]mm · 12 of 239 slices shown (1 of 2)]
[im 19/239  lung]
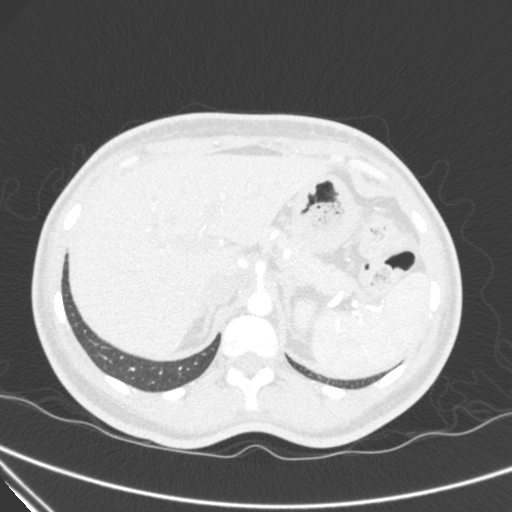
[im 37/239  mediastinal]
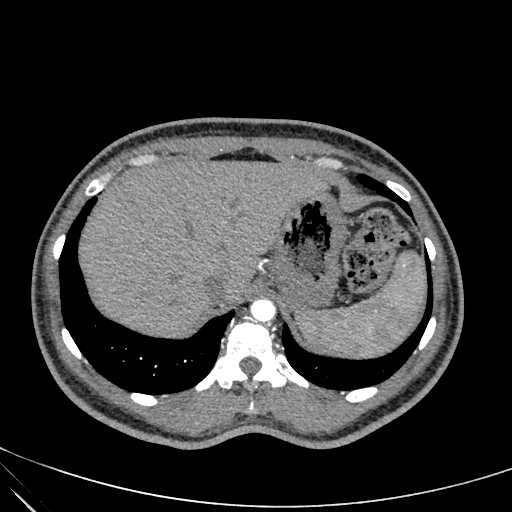
[im 55/239  lung]
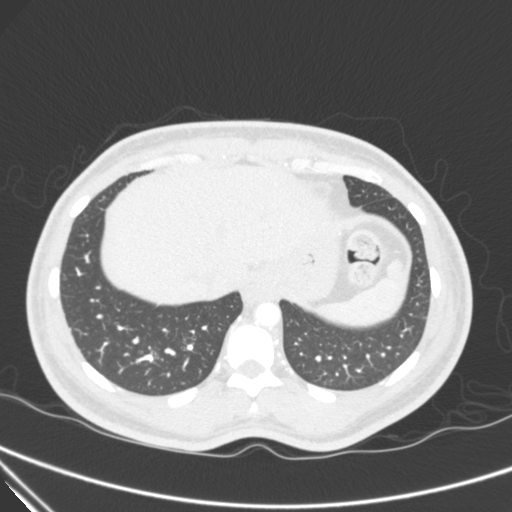
[im 74/239  mediastinal]
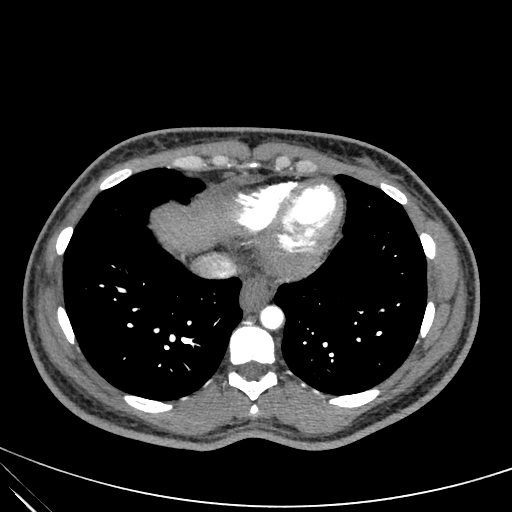
[im 92/239  lung]
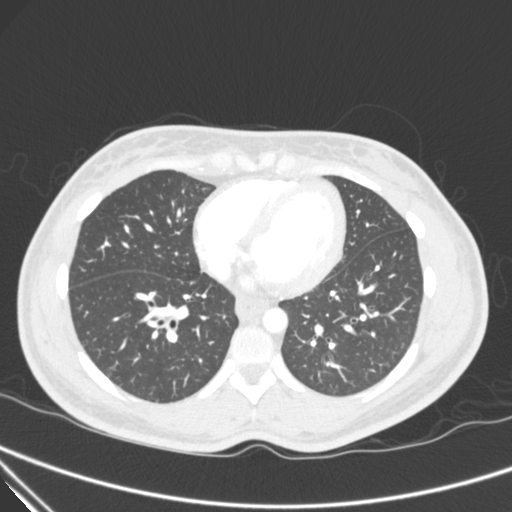
[im 110/239  mediastinal]
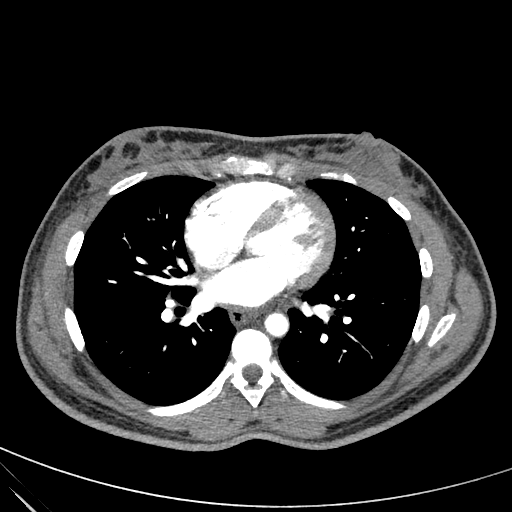
[im 129/239  lung]
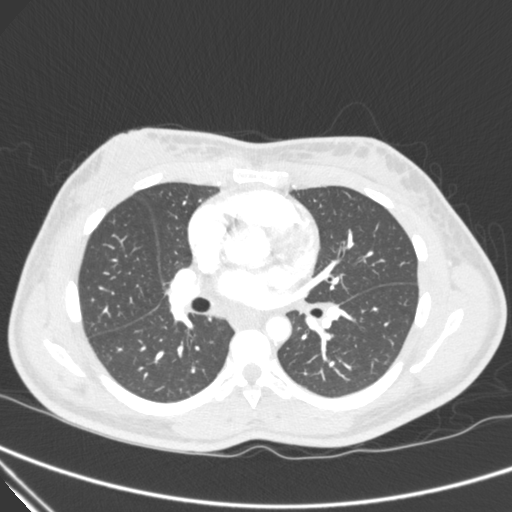
[im 147/239  mediastinal]
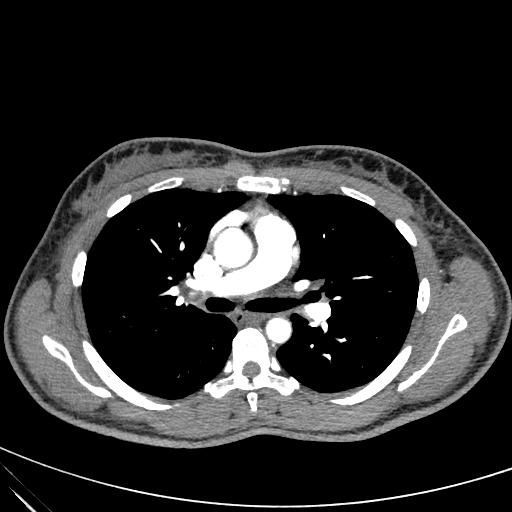
[im 165/239  lung]
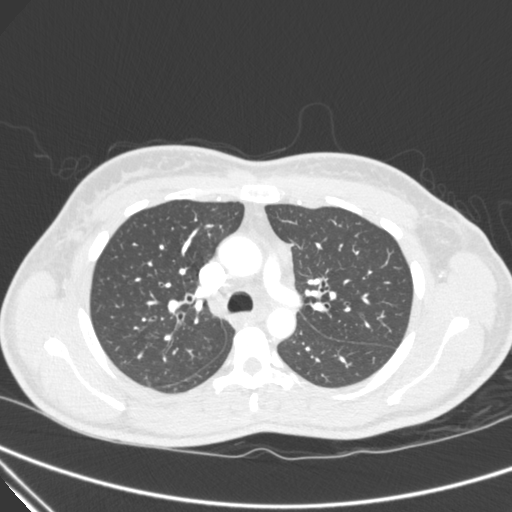
[im 184/239  mediastinal]
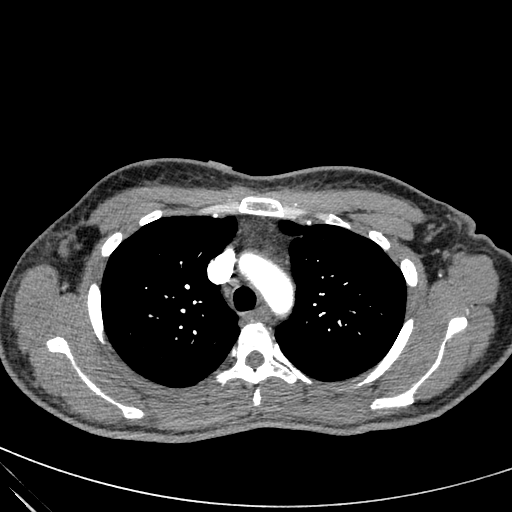
[im 202/239  lung]
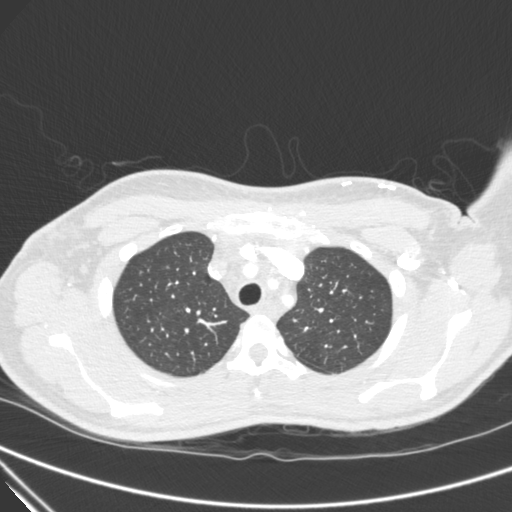
[im 220/239  mediastinal]
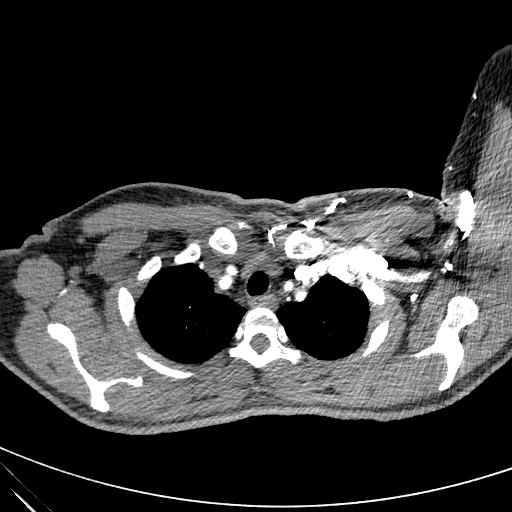

[Series 9: pe thins · axial · 0.68mm/px · z∈[+14,+53]mm · 3 of 79 slices shown (2 of 2)]
[im 20/79  lung]
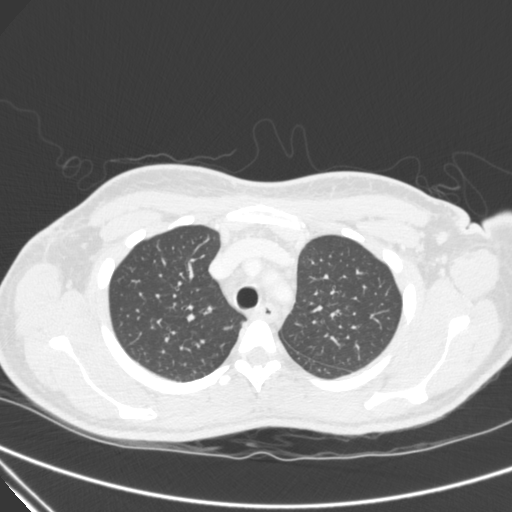
[im 40/79  lung]
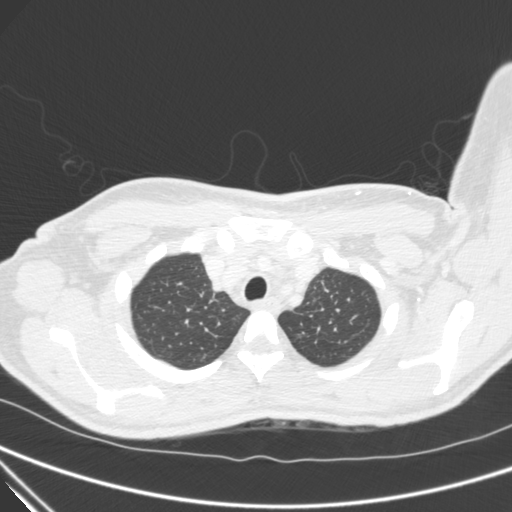
[im 59/79  lung]
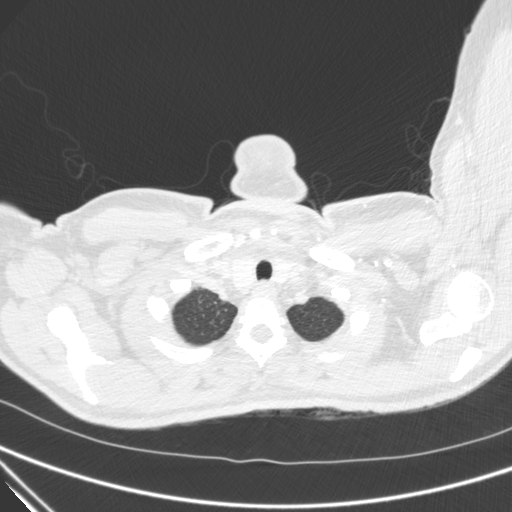

[15 of 36 positions shown; findings below may reference images not displayed]

FINDINGS: The pulmonary arteries are well opacified with contrast.
There is no evidence of acute pulmonary embolism.  The thoracic
aorta appears normal.  There are no enlarged mediastinal or hilar
lymph nodes.  A small amount of residual thymic tissue is noted.
There is a small hiatal hernia.

There is no pleural or pericardial effusion. The lungs are clear.
There is minimal central airway thickening.

The visualized upper abdomen appears unremarkable.
IMPRESSION: 1.  No evidence of acute pulmonary embolism other acute chest
process.
2.  Mild central airway thickening.
3.  Small hiatal hernia.

## 2013-01-23 ENCOUNTER — Ambulatory Visit: Payer: 59 | Admitting: Gastroenterology

## 2013-02-02 ENCOUNTER — Ambulatory Visit: Payer: 59 | Admitting: Gastroenterology

## 2013-02-12 ENCOUNTER — Inpatient Hospital Stay (HOSPITAL_COMMUNITY)
Admission: EM | Admit: 2013-02-12 | Discharge: 2013-02-17 | DRG: 638 | Disposition: A | Discharge status: Home / Self Care | Payer: 59 | Attending: Internal Medicine | Admitting: Internal Medicine

## 2013-02-12 ENCOUNTER — Encounter (HOSPITAL_COMMUNITY): Payer: Self-pay

## 2013-02-12 DIAGNOSIS — I10 Essential (primary) hypertension: Secondary | ICD-10-CM

## 2013-02-12 DIAGNOSIS — R7309 Other abnormal glucose: Secondary | ICD-10-CM

## 2013-02-12 DIAGNOSIS — E1010 Type 1 diabetes mellitus with ketoacidosis without coma: Principal | ICD-10-CM | POA: Diagnosis present

## 2013-02-12 DIAGNOSIS — E1142 Type 2 diabetes mellitus with diabetic polyneuropathy: Secondary | ICD-10-CM | POA: Diagnosis present

## 2013-02-12 DIAGNOSIS — R739 Hyperglycemia, unspecified: Secondary | ICD-10-CM

## 2013-02-12 DIAGNOSIS — E872 Acidosis: Secondary | ICD-10-CM

## 2013-02-12 DIAGNOSIS — Z7982 Long term (current) use of aspirin: Secondary | ICD-10-CM

## 2013-02-12 DIAGNOSIS — R109 Unspecified abdominal pain: Secondary | ICD-10-CM

## 2013-02-12 DIAGNOSIS — E1049 Type 1 diabetes mellitus with other diabetic neurological complication: Secondary | ICD-10-CM | POA: Diagnosis present

## 2013-02-12 DIAGNOSIS — R1110 Vomiting, unspecified: Secondary | ICD-10-CM

## 2013-02-12 DIAGNOSIS — E1065 Type 1 diabetes mellitus with hyperglycemia: Secondary | ICD-10-CM | POA: Diagnosis present

## 2013-02-12 DIAGNOSIS — K3184 Gastroparesis: Secondary | ICD-10-CM | POA: Diagnosis present

## 2013-02-12 DIAGNOSIS — Z794 Long term (current) use of insulin: Secondary | ICD-10-CM

## 2013-02-12 DIAGNOSIS — I959 Hypotension, unspecified: Secondary | ICD-10-CM | POA: Diagnosis not present

## 2013-02-12 DIAGNOSIS — F411 Generalized anxiety disorder: Secondary | ICD-10-CM | POA: Diagnosis present

## 2013-02-12 DIAGNOSIS — D649 Anemia, unspecified: Secondary | ICD-10-CM | POA: Diagnosis present

## 2013-02-12 DIAGNOSIS — E1110 Type 2 diabetes mellitus with ketoacidosis without coma: Secondary | ICD-10-CM

## 2013-02-12 DIAGNOSIS — E109 Type 1 diabetes mellitus without complications: Secondary | ICD-10-CM

## 2013-02-12 DIAGNOSIS — E8729 Other acidosis: Secondary | ICD-10-CM | POA: Diagnosis present

## 2013-02-12 DIAGNOSIS — K219 Gastro-esophageal reflux disease without esophagitis: Secondary | ICD-10-CM | POA: Diagnosis present

## 2013-02-12 DIAGNOSIS — N179 Acute kidney failure, unspecified: Secondary | ICD-10-CM | POA: Diagnosis present

## 2013-02-12 DIAGNOSIS — E860 Dehydration: Secondary | ICD-10-CM | POA: Diagnosis present

## 2013-02-12 DIAGNOSIS — Z79899 Other long term (current) drug therapy: Secondary | ICD-10-CM

## 2013-02-12 DIAGNOSIS — E1140 Type 2 diabetes mellitus with diabetic neuropathy, unspecified: Secondary | ICD-10-CM

## 2013-02-12 LAB — BLOOD GAS, VENOUS
Acid-base deficit: 10.8 mmol/L — ABNORMAL HIGH (ref 0.0–2.0)
FIO2: 0.21 %
O2 Saturation: 87 %
Patient temperature: 98.6
TCO2: 12.3 mmol/L (ref 0–100)

## 2013-02-12 LAB — COMPREHENSIVE METABOLIC PANEL
AST: 24 U/L (ref 0–37)
BUN: 24 mg/dL — ABNORMAL HIGH (ref 6–23)
CO2: 16 mEq/L — ABNORMAL LOW (ref 19–32)
Calcium: 10.8 mg/dL — ABNORMAL HIGH (ref 8.4–10.5)
Chloride: 93 mEq/L — ABNORMAL LOW (ref 96–112)
Creatinine, Ser: 1.44 mg/dL — ABNORMAL HIGH (ref 0.50–1.10)
GFR calc non Af Amer: 43 mL/min — ABNORMAL LOW (ref >90)
Total Bilirubin: 0.7 mg/dL (ref 0.3–1.2)

## 2013-02-12 LAB — BASIC METABOLIC PANEL
BUN: 22 mg/dL (ref 6–23)
Chloride: 101 mEq/L (ref 96–112)
GFR calc Af Amer: 62 mL/min — ABNORMAL LOW (ref >90)
GFR calc non Af Amer: 53 mL/min — ABNORMAL LOW (ref >90)
Glucose, Bld: 238 mg/dL — ABNORMAL HIGH (ref 70–99)
Potassium: 5.1 mEq/L (ref 3.5–5.1)
Sodium: 132 mEq/L — ABNORMAL LOW (ref 135–145)

## 2013-02-12 LAB — CBC WITH DIFFERENTIAL/PLATELET
Basophils Absolute: 0.1 10*3/uL (ref 0.0–0.1)
Basophils Relative: 1 % (ref 0–1)
Eosinophils Relative: 2 % (ref 0–5)
HCT: 40 % (ref 36.0–46.0)
Hemoglobin: 13.3 g/dL (ref 12.0–15.0)
Lymphocytes Relative: 20 % (ref 12–46)
MCHC: 33.3 g/dL (ref 30.0–36.0)
MCV: 80.3 fL (ref 78.0–100.0)
Monocytes Absolute: 0.7 10*3/uL (ref 0.1–1.0)
Monocytes Relative: 8 % (ref 3–12)
Neutro Abs: 6.2 10*3/uL (ref 1.7–7.7)
RDW: 15.4 % (ref 11.5–15.5)

## 2013-02-12 LAB — URINALYSIS, ROUTINE W REFLEX MICROSCOPIC
Leukocytes, UA: NEGATIVE
Nitrite: NEGATIVE
Protein, ur: 100 mg/dL — AB
Specific Gravity, Urine: 1.03 (ref 1.005–1.030)
Urobilinogen, UA: 0.2 mg/dL (ref 0.0–1.0)

## 2013-02-12 LAB — GLUCOSE, CAPILLARY: Glucose-Capillary: 352 mg/dL — ABNORMAL HIGH (ref 70–99)

## 2013-02-12 LAB — PREGNANCY, URINE: Preg Test, Ur: NEGATIVE

## 2013-02-12 MED ORDER — ALUM & MAG HYDROXIDE-SIMETH 200-200-20 MG/5ML PO SUSP: 30.0000 mL | Freq: Four times a day (QID) | ORAL | Status: DC | PRN

## 2013-02-12 MED ORDER — SENNOSIDES-DOCUSATE SODIUM 8.6-50 MG PO TABS
1.0000 | ORAL_TABLET | Freq: Every evening | ORAL | Status: DC | PRN
  Filled 2013-02-12: qty 1

## 2013-02-12 MED ORDER — ACETAMINOPHEN 325 MG PO TABS
650.0000 mg | ORAL_TABLET | Freq: Four times a day (QID) | ORAL | Status: DC | PRN
  Administered 2013-02-16: 650 mg via ORAL
  Filled 2013-02-12: qty 2

## 2013-02-12 MED ORDER — PROMETHAZINE HCL 25 MG/ML IJ SOLN
12.5000 mg | Freq: Once | INTRAMUSCULAR | Status: AC
Start: 2013-02-12 — End: 2013-02-12
  Administered 2013-02-12: 12.5 mg via INTRAVENOUS
  Filled 2013-02-12: qty 1

## 2013-02-12 MED ORDER — SODIUM CHLORIDE 0.9 % IV SOLN
1000.0000 mL | Freq: Once | INTRAVENOUS | Status: AC
  Administered 2013-02-12: 1000 mL via INTRAVENOUS

## 2013-02-12 MED ORDER — SODIUM CHLORIDE 0.9 % IV SOLN
1000.0000 mL | INTRAVENOUS | Status: DC
  Administered 2013-02-13 (×2): 1000 mL via INTRAVENOUS

## 2013-02-12 MED ORDER — SODIUM CHLORIDE 0.9 % IV SOLN: INTRAVENOUS | Status: DC

## 2013-02-12 MED ORDER — ONDANSETRON HCL 4 MG/2ML IJ SOLN
4.0000 mg | Freq: Four times a day (QID) | INTRAMUSCULAR | Status: DC | PRN
  Administered 2013-02-13 – 2013-02-16 (×8): 4 mg via INTRAVENOUS
  Filled 2013-02-12 (×9): qty 2

## 2013-02-12 MED ORDER — SODIUM CHLORIDE 0.9 % IV BOLUS (SEPSIS)
1000.0000 mL | Freq: Once | INTRAVENOUS | Status: AC
  Administered 2013-02-12: 1000 mL via INTRAVENOUS

## 2013-02-12 MED ORDER — ONDANSETRON HCL 4 MG PO TABS: 4.0000 mg | ORAL_TABLET | Freq: Four times a day (QID) | ORAL | Status: DC | PRN

## 2013-02-12 MED ORDER — ACETAMINOPHEN 650 MG RE SUPP: 650.0000 mg | Freq: Four times a day (QID) | RECTAL | Status: DC | PRN

## 2013-02-12 MED ORDER — SODIUM CHLORIDE 0.9 % IV SOLN
INTRAVENOUS | Status: DC
  Administered 2013-02-12: 22:00:00 via INTRAVENOUS

## 2013-02-12 MED ORDER — PREGABALIN 75 MG PO CAPS
150.0000 mg | ORAL_CAPSULE | Freq: Two times a day (BID) | ORAL | Status: DC
  Administered 2013-02-13 – 2013-02-17 (×8): 150 mg via ORAL
  Filled 2013-02-12 (×2): qty 2
  Filled 2013-02-12: qty 1
  Filled 2013-02-12: qty 2
  Filled 2013-02-12: qty 1
  Filled 2013-02-12: qty 2
  Filled 2013-02-12 (×2): qty 1
  Filled 2013-02-12: qty 6
  Filled 2013-02-12: qty 2

## 2013-02-12 MED ORDER — ERYTHROMYCIN ETHYLSUCCINATE 200 MG/5ML PO SUSR
125.0000 mg | Freq: Three times a day (TID) | ORAL | Status: DC
  Administered 2013-02-12 – 2013-02-17 (×9): 125 mg via ORAL
  Filled 2013-02-12 (×21): qty 3.1

## 2013-02-12 MED ORDER — HYDROCODONE-ACETAMINOPHEN 5-325 MG PO TABS
1.0000 | ORAL_TABLET | ORAL | Status: DC | PRN
  Administered 2013-02-13 – 2013-02-14 (×3): 2 via ORAL
  Administered 2013-02-15: 1 via ORAL
  Filled 2013-02-12 (×3): qty 2
  Filled 2013-02-12: qty 1

## 2013-02-12 MED ORDER — HYDROMORPHONE HCL PF 1 MG/ML IJ SOLN
0.5000 mg | INTRAMUSCULAR | Status: DC | PRN
  Administered 2013-02-13 (×2): 0.5 mg via INTRAVENOUS
  Filled 2013-02-12 (×3): qty 1

## 2013-02-12 MED ORDER — ONDANSETRON HCL 4 MG/2ML IJ SOLN
4.0000 mg | Freq: Once | INTRAMUSCULAR | Status: AC
  Administered 2013-02-12: 4 mg via INTRAVENOUS
  Filled 2013-02-12: qty 2

## 2013-02-12 MED ORDER — SODIUM CHLORIDE 0.9 % IV SOLN
INTRAVENOUS | Status: DC
  Filled 2013-02-12: qty 1

## 2013-02-12 MED ORDER — SODIUM CHLORIDE 0.9 % IV SOLN: 1000.0000 mL | INTRAVENOUS | Status: DC

## 2013-02-12 MED ORDER — DEXTROSE-NACL 5-0.45 % IV SOLN: INTRAVENOUS | Status: DC

## 2013-02-12 MED ORDER — MORPHINE SULFATE 4 MG/ML IJ SOLN
4.0000 mg | Freq: Once | INTRAMUSCULAR | Status: AC
  Administered 2013-02-12: 4 mg via INTRAVENOUS
  Filled 2013-02-12: qty 1

## 2013-02-12 MED ORDER — ENOXAPARIN SODIUM 40 MG/0.4ML ~~LOC~~ SOLN
40.0000 mg | Freq: Every day | SUBCUTANEOUS | Status: DC
  Administered 2013-02-14 – 2013-02-16 (×3): 40 mg via SUBCUTANEOUS
  Filled 2013-02-12 (×4): qty 0.4

## 2013-02-12 MED ORDER — HYDRALAZINE HCL 20 MG/ML IJ SOLN
5.0000 mg | INTRAMUSCULAR | Status: DC | PRN
  Administered 2013-02-13 – 2013-02-14 (×2): 5 mg via INTRAVENOUS
  Filled 2013-02-12 (×2): qty 1

## 2013-02-12 MED ORDER — INSULIN GLARGINE 100 UNIT/ML ~~LOC~~ SOLN
10.0000 [IU] | Freq: Once | SUBCUTANEOUS | Status: AC
  Administered 2013-02-12: 10 [IU] via SUBCUTANEOUS
  Filled 2013-02-12: qty 0.1

## 2013-02-12 MED ORDER — INSULIN ASPART 100 UNIT/ML ~~LOC~~ SOLN
10.0000 [IU] | Freq: Three times a day (TID) | SUBCUTANEOUS | Status: DC
  Administered 2013-02-12: 10 [IU] via SUBCUTANEOUS

## 2013-02-12 MED ORDER — FENTANYL CITRATE 0.05 MG/ML IJ SOLN
50.0000 ug | Freq: Once | INTRAMUSCULAR | Status: AC
  Administered 2013-02-12: 50 ug via INTRAVENOUS
  Filled 2013-02-12: qty 2

## 2013-02-12 NOTE — ED Notes (Signed)
Pt presents with NAD. N/V x 1 day. Taking Insulin 6 units SQ of Novolog at 5pm. Last time ate last night. Pt is unable to eat PO.

## 2013-02-12 NOTE — H&P (Signed)
PCP:   Lonia Blood, MD   Chief Complaint:  Nausea and vomiting  HPI: This is a 47 year old female with known history of diabetes mellitus type 1. She states that yesterday morning she forgot to take her a.m. insulin, her sugars went up to 415. She developed nausea and vomiting today and some abdominal discomfort. She took her insulin however she was unable to  her control symptoms specifically for nausea and vomiting. Here in the ER her fingerstick blood sugar  was improved to 241. She's received aggressive fluid hydration however she continues to be very symptomatic with nausea vomiting. The hospitalist was called with request to admit. The patient denies any fevers, chills, cough, or burning on urination. The patient was recently admitted here in June with DKA. History provided by the patient. she also has significant gastroparesis.   Review of Systems:  The patient denies anorexia, fever, weight loss,, vision loss, decreased hearing, hoarseness, chest pain, syncope, dyspnea on exertion, peripheral edema, balance deficits, hemoptysis, abdominal pain, melena, hematochezia, severe indigestion/heartburn, hematuria, incontinence, genital sores, muscle weakness, suspicious skin lesions, transient blindness, difficulty walking, depression, unusual weight change, abnormal bleeding, enlarged lymph nodes, angioedema, and breast masses.  Past Medical History: Past Medical History  Diagnosis Date  . Diabetes mellitus     diagnosed age 60  . Hypertension   . Cataract   . Anxiety   . Anemia   . GERD (gastroesophageal reflux disease)   . Constipation 04/03/2012  . Complication of anesthesia   . PONV (postoperative nausea and vomiting)   . Asthma     IN CHILDHOOD  . Neuromuscular disorder     Diabetic neuropathy  . Gastroparesis   . Diabetic neuropathy    Past Surgical History  Procedure Laterality Date  . Retinal detachment surgery    . Posterior vitrectomy and membrane peel-left eye  09/28/2002   . Posterior vitrectomy and membrane peel-right eye  03/16/2002  . Eye surgery    . Gailstones      Medications: Prior to Admission medications   Medication Sig Start Date End Date Taking? Authorizing Provider  aspirin EC 81 MG EC tablet Take 1 tablet (81 mg total) by mouth daily. 11/07/12  Yes Marinda Elk, MD  erythromycin ethylsuccinate (EES) 200 MG/5ML suspension 125 mg to take three times a day 30 minutes before meals 12/22/12  Yes Meredith Pel, NP  ferrous sulfate 325 (65 FE) MG tablet Take 325 mg by mouth 3 (three) times a week.    Yes Historical Provider, MD  furosemide (LASIX) 20 MG tablet Take 2 tablets (40 mg total) by mouth 2 (two) times daily. 11/07/12  Yes Marinda Elk, MD  insulin aspart (NOVOLOG) 100 UNIT/ML injection Inject 0-15 Units into the skin 3 (three) times daily with meals. 11/28/12  Yes Nishant Dhungel, MD  insulin detemir (LEVEMIR) 100 UNIT/ML injection Inject 0.2 mLs (20 Units total) into the skin at bedtime. 11/28/12  Yes Nishant Dhungel, MD  lisinopril (PRINIVIL,ZESTRIL) 10 MG tablet Take 10 mg by mouth every morning.    Yes Historical Provider, MD  pregabalin (LYRICA) 150 MG capsule Take 150 mg by mouth 2 (two) times daily.   Yes Historical Provider, MD    Allergies:   Allergies  Allergen Reactions  . Anesthetics, Amide Nausea And Vomiting  . Morphine And Related Rash    Social History:  reports that she has never smoked. She has never used smokeless tobacco. She reports that  drinks alcohol. She reports that she does  not use illicit drugs.  Family History: Family History  Problem Relation Age of Onset  . Cystic fibrosis Mother   . Hypertension Father   . Diabetes Brother   . Hypertension Maternal Grandmother     Physical Exam: Filed Vitals:   02/12/13 1739 02/12/13 1900 02/12/13 2014  BP: 128/74 124/64 141/69  Pulse: 109    Temp: 98.3 F (36.8 C)  98.1 F (36.7 C)  TempSrc: Oral  Oral  Resp: 20  20  SpO2: 99%  100%     General:  Alert and oriented times three, well developed and nourished, feels ill Eyes: PERRLA, pink conjunctiva, no scleral icterus ENT: Moist oral mucosa, neck supple, no thyromegaly Lungs: clear to ascultation, no wheeze, no crackles, no use of accessory muscles Cardiovascular: regular rate and rhythm, no regurgitation, no gallops, no murmurs. No carotid bruits, no JVD Abdomen: soft, positive BS, non-tender, non-distended, no organomegaly, not an acute abdomen GU: not examined Neuro: CN II - XII grossly intact, sensation intact Musculoskeletal: strength 5/5 all extremities, no clubbing, cyanosis or edema Skin: no rash, no subcutaneous crepitation, no decubitus Psych: appropriate patient   Labs on Admission:   Recent Labs  02/12/13 1838 02/12/13 2059  NA 130* 132*  K 4.3 5.1  CL 93* 101  CO2 16* 15*  GLUCOSE 211* 238*  BUN 24* 22  CREATININE 1.44* 1.21*  CALCIUM 10.8* 8.8    Recent Labs  02/12/13 1838  AST 24  ALT 23  ALKPHOS 118*  BILITOT 0.7  PROT 9.0*  ALBUMIN 4.4   No results found for this basename: LIPASE, AMYLASE,  in the last 72 hours  Recent Labs  02/12/13 1838  WBC 8.9  NEUTROABS 6.2  HGB 13.3  HCT 40.0  MCV 80.3  PLT 359   No results found for this basename: CKTOTAL, CKMB, CKMBINDEX, TROPONINI,  in the last 72 hours No components found with this basename: POCBNP,  No results found for this basename: DDIMER,  in the last 72 hours No results found for this basename: HGBA1C,  in the last 72 hours No results found for this basename: CHOL, HDL, LDLCALC, TRIG, CHOLHDL, LDLDIRECT,  in the last 72 hours No results found for this basename: TSH, T4TOTAL, FREET3, T3FREE, THYROIDAB,  in the last 72 hours No results found for this basename: VITAMINB12, FOLATE, FERRITIN, TIBC, IRON, RETICCTPCT,  in the last 72 hours  Micro Results:  Results for [name], [name] (MRN [id number]) as of 02/12/2013 22:41  Ref. Range 02/12/2013 18:45  Color, Urine Latest  Range: YELLOW  YELLOW  APPearance Latest Range: CLEAR  CLOUDY (A)  Specific Gravity, Urine Latest Range: 1.005-1.030  1.030  pH Latest Range: 5.0-8.0  5.0  Glucose Latest Range: NEGATIVE mg/dL 829 (A)  Bilirubin Urine Latest Range: NEGATIVE  LARGE (A)  Ketones, ur Latest Range: NEGATIVE mg/dL 15 (A)  Protein Latest Range: NEGATIVE mg/dL 562 (A)  Urobilinogen, UA Latest Range: 0.0-1.0 mg/dL 0.2  Nitrite Latest Range: NEGATIVE  NEGATIVE  Leukocytes, UA Latest Range: NEGATIVE  NEGATIVE  Hgb urine dipstick Latest Range: NEGATIVE  NEGATIVE  Squamous Epithelial / LPF Latest Range: RARE  FEW (A)  Preg Test, Ur Latest Range: NEGATIVE  NEGATIVE    Radiological Exams on Admission: No results found.  Assessment/Plan Present on Admission:  . DKA (diabetic ketoacidoses): will admit to MedSurg, aggressive IV fluid hydration, 1 L normal saline bolus now Resume NovoLog, dosage adjusted patient will receive 10 units 3 times a day with meals. As patient  needs insulin tonight I will give her 10 units of Levemir once now. Currently patient's fingerstick blood sugars 244 BMP at 1 AM Antiemetics when necessary  Gastroparesis: Continue home medications  . Hypertension: ACE inhibitor held, resume and creatinine begins to trend down Clonidine when necessary Acute kidney injury/dehydration: Due to nausea vomiting, she doesn't rectal IV hydration   Full code DVT prophylaxis  [name] 02/12/2013, 10:32 PM

## 2013-02-12 NOTE — ED Provider Notes (Signed)
TIME SEEN: 6:08 PM  CHIEF COMPLAINT: Nausea, vomiting, abdominal pain, hyperglycemia  HPI: Patient is a 47 year old female with a history of type 1 diabetes on sliding scale NovoLog and 11 year 10 units in the morning and 14 units at night he presents the emergency department with hyperglycemia, abdominal pain, nausea vomiting. Patient reports that she missed her morning dose of Levemir due to being arrested get to work. Since that time she's had difficulty managing her blood sugar and has been in the 400s. She began developing abdominal pain, nausea and vomiting similar to her prior episodes of DKA this morning. She has not had any fever, cough, chest pain or shortness of breath. No dysuria or hematuria. No diarrhea.  ROS: See HPI Constitutional: no fever  Eyes: no drainage  ENT: no runny nose   Cardiovascular:  no chest pain  Resp: no SOB  GI: no vomiting GU: no dysuria Integumentary: no rash  Allergy: no hives  Musculoskeletal: no leg swelling  Neurological: no slurred speech ROS otherwise negative  PAST MEDICAL HISTORY/PAST SURGICAL HISTORY:  Past Medical History  Diagnosis Date  . Diabetes mellitus     diagnosed age 47  . Hypertension   . Cataract   . Anxiety   . Anemia   . GERD (gastroesophageal reflux disease)   . Constipation 04/03/2012  . Complication of anesthesia   . PONV (postoperative nausea and vomiting)   . Asthma     IN CHILDHOOD  . Neuromuscular disorder     Diabetic neuropathy  . Gastroparesis   . Diabetic neuropathy     MEDICATIONS:  Prior to Admission medications   Medication Sig Start Date End Date Taking? Authorizing Provider  aspirin EC 81 MG EC tablet Take 1 tablet (81 mg total) by mouth daily. 11/07/12   Marinda Elk, MD  erythromycin ethylsuccinate (EES) 200 MG/5ML suspension 125 mg to take three times a day 30 minutes before meals 12/22/12   Meredith Pel, NP  ferrous sulfate 325 (65 FE) MG tablet Take 325 mg by mouth 3 (three) times a  week.     Historical Provider, MD  furosemide (LASIX) 20 MG tablet Take 2 tablets (40 mg total) by mouth 2 (two) times daily. 11/07/12   Marinda Elk, MD  insulin aspart (NOVOLOG) 100 UNIT/ML injection Inject 0-15 Units into the skin 3 (three) times daily with meals. 11/28/12   Nishant Dhungel, MD  insulin detemir (LEVEMIR) 100 UNIT/ML injection Inject 0.2 mLs (20 Units total) into the skin at bedtime. 11/28/12   Nishant Dhungel, MD  Linaclotide (LINZESS) 145 MCG CAPS Take 1 capsule (145 mcg total) by mouth daily. 12/22/12   Meredith Pel, NP  lisinopril (PRINIVIL,ZESTRIL) 10 MG tablet Take 10 mg by mouth every morning.     Historical Provider, MD  pregabalin (LYRICA) 150 MG capsule Take 150 mg by mouth 2 (two) times daily.    Historical Provider, MD  promethazine (PHENERGAN) 25 MG tablet Take 25 mg by mouth every 6 (six) hours as needed. For nausea. 03/28/12   Dorothea Ogle, MD    ALLERGIES:  Allergies  Allergen Reactions  . Anesthetics, Amide Nausea And Vomiting  . Morphine And Related Rash    SOCIAL HISTORY:  History  Substance Use Topics  . Smoking status: Never Smoker   . Smokeless tobacco: Never Used  . Alcohol Use: Yes     Comment: occas  wine    FAMILY HISTORY: Family History  Problem Relation Age of Onset  .  Cystic fibrosis Mother   . Hypertension Father   . Diabetes Brother   . Hypertension Maternal Grandmother     EXAM: BP 128/74  Pulse 109  Temp(Src) 98.3 F (36.8 C) (Oral)  Resp 20  SpO2 99%  LMP 01/17/2013 CONSTITUTIONAL: Alert and oriented and responds appropriately to questions. Appears uncomfortable HEAD: Normocephalic EYES: Conjunctivae clear, PERRL ENT: normal nose; no rhinorrhea; dry mucous membranes; pharynx without lesions noted NECK: Supple, no meningismus, no LAD  CARD: RRR; S1 and S2 appreciated; no murmurs, no clicks, no rubs, no gallops RESP: Normal chest excursion without splinting or tachypnea; breath sounds clear and equal bilaterally;  no wheezes, no rhonchi, no rales,  ABD/GI: Normal bowel sounds; non-distended; soft, non-tender, no rebound, no guarding BACK:  The back appears normal and is non-tender to palpation, there is no CVA tenderness EXT: Normal ROM in all joints; non-tender to palpation; no edema; normal capillary refill; no cyanosis    SKIN: Normal color for age and race; warm NEURO: Moves all extremities equally PSYCH: The patient's mood and manner are appropriate. Grooming and personal hygiene are appropriate.  MEDICAL DECISION MAKING: Patient with hyperglycemia with concerns for DKA. We'll obtain labs, urinalysis. Will give IV fluids, closely monitor blood glucose. Patient may need admission for insulin drip. Given her benign abdominal exam, I am not concerned for any other intra-abdominal pathology at this time.  ED PROGRESS: Glucose is 211. Patient's bicarbonate is 16. Will give IV fluids and reassess. ABG pending. We may be able to correct patient's acidosis in the ED with IV fluid.  9:39 PM  Patient still has a very mild metabolic acidosis after 2 L of IV fluids. She is still symptomatic and unable to tolerate by mouth. I do not feel she needs an insulin drip at this time as her glucoses been under 200 and her acidosis is very mild. We'll continue to hydrate. Will discuss with hospitalist for admission.  Layla Maw Ward, DO 02/12/13 2342

## 2013-02-13 DIAGNOSIS — E1110 Type 2 diabetes mellitus with ketoacidosis without coma: Secondary | ICD-10-CM

## 2013-02-13 DIAGNOSIS — E1142 Type 2 diabetes mellitus with diabetic polyneuropathy: Secondary | ICD-10-CM

## 2013-02-13 DIAGNOSIS — E872 Acidosis: Secondary | ICD-10-CM | POA: Diagnosis present

## 2013-02-13 DIAGNOSIS — I10 Essential (primary) hypertension: Secondary | ICD-10-CM

## 2013-02-13 DIAGNOSIS — E1149 Type 2 diabetes mellitus with other diabetic neurological complication: Secondary | ICD-10-CM

## 2013-02-13 LAB — CBC
Hemoglobin: 11.5 g/dL — ABNORMAL LOW (ref 12.0–15.0)
MCHC: 32.3 g/dL (ref 30.0–36.0)
RDW: 15.9 % — ABNORMAL HIGH (ref 11.5–15.5)
WBC: 15.6 10*3/uL — ABNORMAL HIGH (ref 4.0–10.5)

## 2013-02-13 LAB — BASIC METABOLIC PANEL
BUN: 11 mg/dL (ref 6–23)
BUN: 9 mg/dL (ref 6–23)
BUN: 8 mg/dL (ref 6–23)
CO2: 13 mEq/L — ABNORMAL LOW (ref 19–32)
CO2: 14 mEq/L — ABNORMAL LOW (ref 19–32)
CO2: 13 mEq/L — ABNORMAL LOW (ref 19–32)
Calcium: 8.8 mg/dL (ref 8.4–10.5)
Calcium: 8.7 mg/dL (ref 8.4–10.5)
Calcium: 9.3 mg/dL (ref 8.4–10.5)
Chloride: 101 mEq/L (ref 96–112)
Chloride: 105 mEq/L (ref 96–112)
Chloride: 103 mEq/L (ref 96–112)
Chloride: 104 mEq/L (ref 96–112)
Creatinine, Ser: 1.06 mg/dL (ref 0.50–1.10)
Creatinine, Ser: 0.9 mg/dL (ref 0.50–1.10)
Creatinine, Ser: 0.83 mg/dL (ref 0.50–1.10)
GFR calc Af Amer: 72 mL/min — ABNORMAL LOW (ref >90)
GFR calc Af Amer: 88 mL/min — ABNORMAL LOW (ref >90)
GFR calc Af Amer: >90 mL/min (ref >90)
GFR calc non Af Amer: 69 mL/min — ABNORMAL LOW (ref >90)
GFR calc non Af Amer: 76 mL/min — ABNORMAL LOW (ref >90)
GFR calc non Af Amer: 84 mL/min — ABNORMAL LOW (ref >90)
Glucose, Bld: 298 mg/dL — ABNORMAL HIGH (ref 70–99)
Glucose, Bld: 266 mg/dL — ABNORMAL HIGH (ref 70–99)
Potassium: 4.7 mEq/L (ref 3.5–5.1)
Potassium: 4.6 mEq/L (ref 3.5–5.1)
Potassium: 4.3 mEq/L (ref 3.5–5.1)
Sodium: 133 mEq/L — ABNORMAL LOW (ref 135–145)
Sodium: 133 mEq/L — ABNORMAL LOW (ref 135–145)
Sodium: 133 mEq/L — ABNORMAL LOW (ref 135–145)

## 2013-02-13 LAB — GLUCOSE, CAPILLARY
Glucose-Capillary: 187 mg/dL — ABNORMAL HIGH (ref 70–99)
Glucose-Capillary: 255 mg/dL — ABNORMAL HIGH (ref 70–99)
Glucose-Capillary: 152 mg/dL — ABNORMAL HIGH (ref 70–99)
Glucose-Capillary: 152 mg/dL — ABNORMAL HIGH (ref 70–99)
Glucose-Capillary: 207 mg/dL — ABNORMAL HIGH (ref 70–99)
Glucose-Capillary: 211 mg/dL — ABNORMAL HIGH (ref 70–99)

## 2013-02-13 MED ORDER — POTASSIUM CHLORIDE 10 MEQ/100ML IV SOLN
10.0000 meq | INTRAVENOUS | Status: AC
Start: 2013-02-13 — End: 2013-02-13
  Administered 2013-02-13 (×2): 10 meq via INTRAVENOUS
  Filled 2013-02-13 (×2): qty 100

## 2013-02-13 MED ORDER — DEXTROSE 50 % IV SOLN
25.0000 mL | INTRAVENOUS | Status: DC | PRN
  Administered 2013-02-15: 25 mL via INTRAVENOUS
  Filled 2013-02-13: qty 50

## 2013-02-13 MED ORDER — DEXTROSE-NACL 5-0.45 % IV SOLN
INTRAVENOUS | Status: DC
  Administered 2013-02-13 – 2013-02-14 (×4): via INTRAVENOUS

## 2013-02-13 MED ORDER — ONDANSETRON HCL 4 MG/2ML IJ SOLN
4.0000 mg | Freq: Once | INTRAMUSCULAR | Status: AC
  Administered 2013-02-13: 4 mg via INTRAVENOUS

## 2013-02-13 MED ORDER — INSULIN ASPART 100 UNIT/ML ~~LOC~~ SOLN: 0.0000 [IU] | Freq: Three times a day (TID) | SUBCUTANEOUS | Status: DC

## 2013-02-13 MED ORDER — INSULIN ASPART 100 UNIT/ML ~~LOC~~ SOLN
5.0000 [IU] | Freq: Once | SUBCUTANEOUS | Status: AC
  Administered 2013-02-13: 5 [IU] via SUBCUTANEOUS

## 2013-02-13 MED ORDER — HYDROMORPHONE HCL PF 2 MG/ML IJ SOLN
2.0000 mg | Freq: Once | INTRAMUSCULAR | Status: AC
  Administered 2013-02-13: 2 mg via INTRAVENOUS
  Filled 2013-02-13: qty 1

## 2013-02-13 MED ORDER — SODIUM CHLORIDE 0.9 % IV SOLN
INTRAVENOUS | Status: AC
  Administered 2013-02-13: 13:00:00 via INTRAVENOUS

## 2013-02-13 MED ORDER — SODIUM CHLORIDE 0.9 % IV SOLN: INTRAVENOUS | Status: DC

## 2013-02-13 MED ORDER — INSULIN ASPART 100 UNIT/ML ~~LOC~~ SOLN: 0.0000 [IU] | Freq: Every day | SUBCUTANEOUS | Status: DC

## 2013-02-13 MED ORDER — INSULIN ASPART 100 UNIT/ML ~~LOC~~ SOLN: 5.0000 [IU] | Freq: Three times a day (TID) | SUBCUTANEOUS | Status: DC

## 2013-02-13 MED ORDER — HYDROMORPHONE HCL PF 1 MG/ML IJ SOLN
1.0000 mg | INTRAMUSCULAR | Status: DC | PRN
  Administered 2013-02-13: 1 mg via INTRAVENOUS
  Filled 2013-02-13: qty 2

## 2013-02-13 MED ORDER — BIOTENE DRY MOUTH MT LIQD
15.0000 mL | Freq: Two times a day (BID) | OROMUCOSAL | Status: DC
  Administered 2013-02-14 – 2013-02-17 (×6): 15 mL via OROMUCOSAL

## 2013-02-13 MED ORDER — PROMETHAZINE HCL 25 MG/ML IJ SOLN
12.5000 mg | Freq: Four times a day (QID) | INTRAMUSCULAR | Status: DC | PRN
  Administered 2013-02-13: 25 mg via INTRAVENOUS
  Administered 2013-02-13: 12.5 mg via INTRAVENOUS
  Administered 2013-02-13: 25 mg via INTRAVENOUS
  Administered 2013-02-14: 12.5 mg via INTRAVENOUS
  Filled 2013-02-13 (×4): qty 1

## 2013-02-13 MED ORDER — SODIUM CHLORIDE 0.9 % IV SOLN
INTRAVENOUS | Status: DC
  Administered 2013-02-13: 0.9 [IU]/h via INTRAVENOUS
  Filled 2013-02-13 (×3): qty 1

## 2013-02-13 NOTE — Care Management Note (Signed)
Cm spoke with patient at bedside concerning DM management. Per pt PCP. Dr.Garba. Pt states having difficulty affording insulin. Cm provided pt with patient assistance card which will enable pt to purchase insulin at pharmacy for a 25.00 copay. Pt instructed to call number listed on card to activate card. Pt states using [facility] on [address] No other barriers identified at this time.   Roxy Manns Ajayla Iglesias,RN,MSN [phone]

## 2013-02-13 NOTE — Progress Notes (Signed)
Discussed covering pt with novolog with Elisabeth Pigeon MD. Pt had stated intentions on eating. Covered her with a one time bolus of 5 units at 1106 to cover meal and CBG.   CBG checked at noon was 185. Since we had just covered her and she did not end up eating I held 1200 dose of novolog. Will continue to monitor. Working with Elisabeth Pigeon MD and Diabetic consult in transferring pt to step down.

## 2013-02-13 NOTE — Progress Notes (Addendum)
[facility] PROGRESS NOTE  [name] ZOX:[id number] DOB: June 28, 1965 DOA: 02/12/2013 PCP: Lonia Blood, MD  Brief narrative: 47 year old female with past medical history of diabetes mellitus type 1, related diabetic neuropathy who presented to [facility] ED with complaints of ongoing nausea, vomiting. In ED vital signs remained stable with blood pressure 110/54, heart rate 99 and T 98.1 F. oxygen saturation was 99% on room air. CBC was unremarkable. BMP revealed sodium of 133, CO2 of 13 and creatinine of 1.13. Patient was found to have anion gap of 21. Patient was not started on insulin drip at the time of the admission. Additional BMP's revealed patient is still in DKA for which reason we will start insulin drip and transferred the patient to step down unit for further management.  Assessment/Plan:  Principal Problem:   DKA (diabetic ketoacidoses) - Secondary to poorly controlled diabetes. A1c in May 2014 is 10.4 indicating poor glycemic control - Initial anion gap was 21 and subsequent BMP revealed anion gap still elevated at 19. - We will discontinue NovoLog as well as sliding scale insulin and start insulin drip per DKA protocol. This also includes bolus of normal saline. Use D5 as IV fluid as CBGs are less than 250. BMP every 2 hours as CBGs every 1 hour per DKA protocol - keep NPO - antiemetics as needed Active Problems:   High anion gap metabolic acidosis - Related to diabetic ketoacidosis - Management as above, DKA protocol in place   Abdominal pain - likely related to DKA - pain regimen: dilaudid 1 mg every 4 hours IV PRN severe pain and norco 1-2 tabs every 4 hours PO PRN moderate pain - keep NPO   Hypertension - Blood pressure at goal now - May use hydralazine  IV when necessary 5 mg every 4 hours    Diabetic neuropathy - Continue pregabalin 150 mg twice a day   Code Status: Full code Family Communication: No family at the bedside Disposition Plan: Transfer to step  down  Manson Passey, MD  [facility] Pager [phone]  If 7PM-7AM, please contact night-coverage [email or web address] Password [email or web address] 02/13/2013, 1:04 PM   LOS: 1 day   Consultants:  Diabetic coordinator  Procedures:  None  Antibiotics:  None  HPI/Subjective: Reports nausea, weakness.  Objective: Filed Vitals:   02/12/13 1900 02/12/13 2014 02/12/13 2332 02/13/13 0456  BP: 124/64 141/69 133/59 110/54  Pulse:   105 99  Temp:  98.1 F (36.7 C) 98.3 F (36.8 C) 98.3 F (36.8 C)  TempSrc:  Oral Oral Oral  Resp:  20 20 20   Height:   5\' 2"  (1.575 m)   SpO2:  100% 100% 100%    Intake/Output Summary (Last 24 hours) at 02/13/13 1304 Last data filed at 02/13/13 1017  Gross per 24 hour  Intake 1691.67 ml  Output    250 ml  Net 1441.67 ml    Exam:   General:  Pt is alert, follows commands appropriately, not in acute distress  Cardiovascular: Regular rate and rhythm, S1/S2, no murmurs, no rubs, no gallops  Respiratory: Clear to auscultation bilaterally, no wheezing, no crackles, no rhonchi  Abdomen: Soft, non tender, non distended, bowel sounds present, no guarding  Extremities: No edema, pulses DP and PT palpable bilaterally  Neuro: Grossly nonfocal  Data Reviewed: Basic Metabolic Panel:  Recent Labs Lab 02/12/13 1838 02/12/13 2059 02/13/13 0141 02/13/13 0528 02/13/13 1122  NA 130* 132* 133* 134* 133*  K 4.3 5.1 4.5 4.7 4.2  CL 93* 101 101 105 102  CO2 16* 15* 13* 14* 12*  GLUCOSE 211* 238* 298* 144* 226*  BUN 24* 22 19 17 14   CREATININE 1.44* 1.21* 1.13* 1.06 0.98  CALCIUM 10.8* 8.8 8.8 8.6 8.7   Liver Function Tests:  Recent Labs Lab 02/12/13 1838  AST 24  ALT 23  ALKPHOS 118*  BILITOT 0.7  PROT 9.0*  ALBUMIN 4.4   No results found for this basename: LIPASE, AMYLASE,  in the last 168 hours No results found for this basename: AMMONIA,  in the last 168 hours CBC:  Recent Labs Lab 02/12/13 1838  WBC 8.9  NEUTROABS 6.2  HGB 13.3  HCT 40.0  MCV  80.3  PLT 359   Cardiac Enzymes: No results found for this basename: CKTOTAL, CKMB, CKMBINDEX, TROPONINI,  in the last 168 hours BNP: No components found with this basename: POCBNP,  CBG:  Recent Labs Lab 02/13/13 0208 02/13/13 0423 02/13/13 0626 02/13/13 0814 02/13/13 1228  GLUCAP 242* 158* 154* 152* 185*    No results found for this or any previous visit (from the past 240 hour(s)).   Studies: No results found.  Scheduled Meds: . enoxaparin (LOVENOX)   40 mg Subcutaneous QHS  . erythromycin ethyl  125 mg Oral Q8H  . potassium chloride  10 mEq Intravenous Q1H  . pregabalin  150 mg Oral BID   Continuous Infusions: . sodium chloride    . dextrose 5 % and 0.45% NaCl    . insulin (NOVOLIN-R) infusion

## 2013-02-13 NOTE — Progress Notes (Signed)
CBGs on 8/21  211-238-352 mg/dl      1/61  [phone] mg/dl Patient admitted last evening with nausea, vomiting, and abdominal pain.  Lab blood sugar at the time was 238 mg/dl.  CO2 was 15 at 2059.  Given Lantus 10 units and Novolog 10 units at that time.  Noted this AM that CO2 was 14 and patient 0700 CBG was 154 mg/dl.  Patient still nauseated and vomiting.  Spoke with Dr. Elisabeth Pigeon about patient's BMETs, IV fluids, blood sugars.  Patient to be transferred to stepdown and placed on DKA order set per Dr. Elisabeth Pigeon.  Spoke with patient about her home insulin regimen.  Patient takes Levemir 20 units every HS and Novolog 0-15 units sliding scale with meals at home.  Important that patient have basal insulin since patient has Type 1 diabetes since the age of 26.   Patient not eating at this time.  May also need some insulin for meal coverage when patient eating if CBGs are greater than 180 mg/dl.  Sees Dr. Everardo All as her endocrinologist.  Will continue to follow while in hospital.   Smith Mince RN BSN CDE

## 2013-02-14 DIAGNOSIS — R109 Unspecified abdominal pain: Secondary | ICD-10-CM

## 2013-02-14 LAB — BASIC METABOLIC PANEL
BUN: 9 mg/dL (ref 6–23)
BUN: 8 mg/dL (ref 6–23)
BUN: 7 mg/dL (ref 6–23)
BUN: 5 mg/dL — ABNORMAL LOW (ref 6–23)
CO2: 13 mEq/L — ABNORMAL LOW (ref 19–32)
CO2: 15 mEq/L — ABNORMAL LOW (ref 19–32)
CO2: 16 mEq/L — ABNORMAL LOW (ref 19–32)
CO2: 17 mEq/L — ABNORMAL LOW (ref 19–32)
CO2: 18 mEq/L — ABNORMAL LOW (ref 19–32)
CO2: 18 mEq/L — ABNORMAL LOW (ref 19–32)
CO2: 19 mEq/L (ref 19–32)
Calcium: 8 mg/dL — ABNORMAL LOW (ref 8.4–10.5)
Calcium: 8.4 mg/dL (ref 8.4–10.5)
Calcium: 8.4 mg/dL (ref 8.4–10.5)
Calcium: 8.7 mg/dL (ref 8.4–10.5)
Chloride: 103 mEq/L (ref 96–112)
Chloride: 103 mEq/L (ref 96–112)
Chloride: 105 mEq/L (ref 96–112)
Chloride: 102 mEq/L (ref 96–112)
Chloride: 100 mEq/L (ref 96–112)
Creatinine, Ser: 0.83 mg/dL (ref 0.50–1.10)
Creatinine, Ser: 0.83 mg/dL (ref 0.50–1.10)
Creatinine, Ser: 0.83 mg/dL (ref 0.50–1.10)
Creatinine, Ser: 0.88 mg/dL (ref 0.50–1.10)
GFR calc Af Amer: 88 mL/min — ABNORMAL LOW (ref >90)
GFR calc Af Amer: >90 mL/min (ref >90)
GFR calc Af Amer: >90 mL/min (ref >90)
GFR calc non Af Amer: 76 mL/min — ABNORMAL LOW (ref >90)
GFR calc non Af Amer: 78 mL/min — ABNORMAL LOW (ref >90)
GFR calc non Af Amer: 84 mL/min — ABNORMAL LOW (ref >90)
GFR calc non Af Amer: >90 mL/min (ref >90)
Glucose, Bld: 111 mg/dL — ABNORMAL HIGH (ref 70–99)
Glucose, Bld: 117 mg/dL — ABNORMAL HIGH (ref 70–99)
Glucose, Bld: 173 mg/dL — ABNORMAL HIGH (ref 70–99)
Glucose, Bld: 146 mg/dL — ABNORMAL HIGH (ref 70–99)
Glucose, Bld: 167 mg/dL — ABNORMAL HIGH (ref 70–99)
Glucose, Bld: 177 mg/dL — ABNORMAL HIGH (ref 70–99)
Glucose, Bld: 265 mg/dL — ABNORMAL HIGH (ref 70–99)
Potassium: 4.9 mEq/L (ref 3.5–5.1)
Potassium: 5 mEq/L (ref 3.5–5.1)
Potassium: 4.9 mEq/L (ref 3.5–5.1)
Potassium: 3.6 mEq/L (ref 3.5–5.1)
Potassium: 3.5 mEq/L (ref 3.5–5.1)
Potassium: 4.5 mEq/L (ref 3.5–5.1)
Sodium: 131 mEq/L — ABNORMAL LOW (ref 135–145)
Sodium: 133 mEq/L — ABNORMAL LOW (ref 135–145)
Sodium: 133 mEq/L — ABNORMAL LOW (ref 135–145)
Sodium: 133 mEq/L — ABNORMAL LOW (ref 135–145)
Sodium: 134 mEq/L — ABNORMAL LOW (ref 135–145)

## 2013-02-14 LAB — GLUCOSE, CAPILLARY
Glucose-Capillary: 120 mg/dL — ABNORMAL HIGH (ref 70–99)
Glucose-Capillary: 122 mg/dL — ABNORMAL HIGH (ref 70–99)
Glucose-Capillary: 125 mg/dL — ABNORMAL HIGH (ref 70–99)

## 2013-02-14 MED ORDER — FENTANYL CITRATE 0.05 MG/ML IJ SOLN
INTRAMUSCULAR | Status: AC
  Filled 2013-02-14: qty 2

## 2013-02-14 MED ORDER — INSULIN ASPART 100 UNIT/ML ~~LOC~~ SOLN
0.0000 [IU] | Freq: Three times a day (TID) | SUBCUTANEOUS | Status: DC
  Administered 2013-02-14: 3 [IU] via SUBCUTANEOUS

## 2013-02-14 MED ORDER — DEXTROSE-NACL 5-0.45 % IV SOLN
INTRAVENOUS | Status: DC
  Administered 2013-02-15: 01:00:00 via INTRAVENOUS

## 2013-02-14 MED ORDER — SODIUM CHLORIDE 0.9 % IJ SOLN
10.0000 mL | Freq: Two times a day (BID) | INTRAMUSCULAR | Status: DC
  Administered 2013-02-14 – 2013-02-16 (×3): 10 mL

## 2013-02-14 MED ORDER — INSULIN GLARGINE 100 UNIT/ML ~~LOC~~ SOLN
5.0000 [IU] | Freq: Every day | SUBCUTANEOUS | Status: DC
  Administered 2013-02-14: 5 [IU] via SUBCUTANEOUS
  Filled 2013-02-14: qty 0.05

## 2013-02-14 MED ORDER — INSULIN GLARGINE 100 UNIT/ML ~~LOC~~ SOLN
5.0000 [IU] | Freq: Once | SUBCUTANEOUS | Status: AC
  Administered 2013-02-14: 5 [IU] via SUBCUTANEOUS
  Filled 2013-02-14: qty 0.05

## 2013-02-14 MED ORDER — FENTANYL CITRATE 0.05 MG/ML IJ SOLN
25.0000 ug | INTRAMUSCULAR | Status: DC | PRN
  Administered 2013-02-14: 50 ug via INTRAVENOUS
  Administered 2013-02-14: 25 ug via INTRAVENOUS
  Administered 2013-02-14 (×3): 50 ug via INTRAVENOUS
  Filled 2013-02-14 (×4): qty 2

## 2013-02-14 MED ORDER — ALPRAZOLAM 0.5 MG PO TABS
0.5000 mg | ORAL_TABLET | Freq: Two times a day (BID) | ORAL | Status: DC | PRN
  Administered 2013-02-14: 0.5 mg via ORAL
  Filled 2013-02-14: qty 1

## 2013-02-14 MED ORDER — SODIUM CHLORIDE 0.9 % IV SOLN
Freq: Once | INTRAVENOUS | Status: AC
  Administered 2013-02-14: via INTRAVENOUS

## 2013-02-14 MED ORDER — INSULIN ASPART 100 UNIT/ML ~~LOC~~ SOLN
0.0000 [IU] | Freq: Every day | SUBCUTANEOUS | Status: DC
  Administered 2013-02-14: 4 [IU] via SUBCUTANEOUS

## 2013-02-14 MED ORDER — SODIUM CHLORIDE 0.9 % IV SOLN
INTRAVENOUS | Status: DC
  Administered 2013-02-14 – 2013-02-15 (×2): via INTRAVENOUS

## 2013-02-14 MED ORDER — INSULIN ASPART 100 UNIT/ML ~~LOC~~ SOLN: 3.0000 [IU] | Freq: Three times a day (TID) | SUBCUTANEOUS | Status: DC

## 2013-02-14 MED ORDER — SODIUM CHLORIDE 0.9 % IJ SOLN: 10.0000 mL | INTRAMUSCULAR | Status: DC | PRN

## 2013-02-14 MED ORDER — SODIUM CHLORIDE 0.9 % IV SOLN
INTRAVENOUS | Status: DC
  Administered 2013-02-14 – 2013-02-15 (×2): 2.5 [IU]/h via INTRAVENOUS
  Administered 2013-02-15: 1.8 [IU]/h via INTRAVENOUS
  Administered 2013-02-15: 1.4 [IU]/h via INTRAVENOUS
  Administered 2013-02-15: 2.3 [IU]/h via INTRAVENOUS
  Filled 2013-02-14 (×2): qty 1

## 2013-02-14 MED ORDER — POTASSIUM CHLORIDE 10 MEQ/100ML IV SOLN
10.0000 meq | INTRAVENOUS | Status: AC
  Administered 2013-02-14 – 2013-02-15 (×2): 10 meq via INTRAVENOUS
  Filled 2013-02-14: qty 200

## 2013-02-14 NOTE — Progress Notes (Signed)
[facility] PROGRESS NOTE  [name] JYN:[id number] DOB: July 13, 1965 DOA: 02/12/2013 PCP: Lonia Blood, MD  Brief narrative: 47 year old female with past medical history of diabetes mellitus type 1, related diabetic neuropathy who presented to [facility] ED with complaints of ongoing nausea, vomiting. In ED vital signs remained stable with blood pressure 110/54, heart rate 99 and T 98.1 F. oxygen saturation was 99% on room air. CBC was unremarkable. BMP revealed sodium of 133, CO2 of 13 and creatinine of 1.13. Patient was found to have anion gap of 21. Patient was not started on insulin drip at the time of the admission. Additional BMP's revealed patient is still in DKA for which reason insulin drip was started and pt was transferred  to step down unit for further management.  Assessment/Plan:   Principal Problem:  DKA (diabetic ketoacidoses)  - Secondary to poorly controlled diabetes. A1c in May 2014 is 10.4 indicating poor glycemic control  - Initial anion gap was 21 and slowly trending down 19 -->15 - CBG's in past 24 hours: 120, 122, 125 - insulin drip on hold per DKA protocol; no won D5 IV fluids to get CBG' slightly up and then we can transition to sub Q lantus once AG closes - keep NPO  - antiemetics as needed   Active Problems:  High anion gap metabolic acidosis  - Related to diabetic ketoacidosis  - Management as above, DKA protocol in place  Abdominal pain  - likely related to DKA  - discontinue dilaudid as it makes pt very nauseaus and start fentanyl 25-50 mcg every 2 hours IV and continue  norco 1-2 tabs every 4 hours PO PRN moderate pain  - keep NPO  Hypertension  - May use hydralazine IV when necessary 5 mg every 4 hours  Diabetic neuropathy  - Continue pregabalin 150 mg twice a day   Code Status: Full code  Family Communication: No family at the bedside  Disposition Plan: home when stable  Manson Passey, MD  [facility]  Pager [phone]   Consultants:  Diabetic  coordinator Procedures:  None Antibiotics:  None    If 7PM-7AM, please contact night-coverage [email or web address] Password [email or web address] 02/14/2013, 6:10 AM   LOS: 2 days    HPI/Subjective: No overnight events. Has abdominal discomfort and very nauseated.  Objective: Filed Vitals:   02/13/13 2315 02/14/13 0000 02/14/13 0200 02/14/13 0400  BP: 105/53 113/55 116/62 124/57  Pulse: 97 96 97 98  Temp:  98 F (36.7 C)  98.9 F (37.2 C)  TempSrc:  Oral  Oral  Resp: 16 17 12 13   Height:      Weight:      SpO2: 97% 99% 98% 98%    Intake/Output Summary (Last 24 hours) at 02/14/13 0610 Last data filed at 02/14/13 0500  Gross per 24 hour  Intake 2005.85 ml  Output    450 ml  Net 1555.85 ml    Exam:   General:  Pt is alert, follows commands appropriately, not in acute distress  Cardiovascular: Regular rate and rhythm, S1/S2, no murmurs, no rubs, no gallops  Respiratory: Clear to auscultation bilaterally, no wheezing, no crackles, no rhonchi  Abdomen: Soft, non tender, non distended, bowel sounds present, no guarding  Extremities: No edema, pulses DP and PT palpable bilaterally  Neuro: Grossly nonfocal  Data Reviewed: Basic Metabolic Panel:  Recent Labs Lab 02/13/13 1943 02/13/13 2155 02/14/13 0026 02/14/13 0150 02/14/13 0340  NA 133* 134* 134* 133* 133*  K 4.3 4.1 4.5 4.9 5.0  CL 104  104 107 106 105  CO2 13* 13* 15* 14* 13*  GLUCOSE 214* 168* 113* 111* 117*  BUN 9 8 9 10 9   CREATININE 0.90 0.83 0.88 0.90 0.86  CALCIUM 8.6 9.3 8.7 8.4 8.7   Liver Function Tests:  Recent Labs Lab 02/12/13 1838  AST 24  ALT 23  ALKPHOS 118*  BILITOT 0.7  PROT 9.0*  ALBUMIN 4.4   No results found for this basename: LIPASE, AMYLASE,  in the last 168 hours No results found for this basename: AMMONIA,  in the last 168 hours CBC:  Recent Labs Lab 02/12/13 1838 02/13/13 1740  WBC 8.9 15.6*  NEUTROABS 6.2  --   HGB 13.3 11.5*  HCT 40.0 35.6*  MCV 80.3 83.0  PLT 359 347    Cardiac Enzymes: No results found for this basename: CKTOTAL, CKMB, CKMBINDEX, TROPONINI,  in the last 168 hours BNP: No components found with this basename: POCBNP,  CBG:  Recent Labs Lab 02/14/13 0114 02/14/13 0232 02/14/13 0339 02/14/13 0429 02/14/13 0525  GLUCAP 100* 104* 120* 122* 125*    Recent Results (from the past 240 hour(s))  MRSA PCR SCREENING     Status: None   Collection Time    02/13/13  3:03 PM      Result Value Range Status   MRSA by PCR NEGATIVE  NEGATIVE Final   Comment:            The GeneXpert MRSA Assay (FDA     approved for NASAL specimens     only), is one component of a     comprehensive MRSA colonization     surveillance program. It is not     intended to diagnose MRSA     infection nor to guide or     monitor treatment for     MRSA infections.     Studies: No results found.  Scheduled Meds: . antiseptic oral rinse  15 mL Mouth Rinse BID  . enoxaparin (LOVENOX) injection  40 mg Subcutaneous QHS  . erythromycin ethylsuccinate  125 mg Oral Q8H  . pregabalin  150 mg Oral BID   Continuous Infusions: . dextrose 5 % and 0.45% NaCl 100 mL/hr at 02/14/13 0021  . insulin (NOVOLIN-R) infusion Stopped (02/14/13 0527)

## 2013-02-14 NOTE — Progress Notes (Signed)
Peripherally Inserted Central Catheter/Midline Placement  The IV Nurse has discussed with the patient and/or persons authorized to consent for the patient, the purpose of this procedure and the potential benefits and risks involved with this procedure.  The benefits include less needle sticks, lab draws from the catheter and patient may be discharged home with the catheter.  Risks include, but not limited to, infection, bleeding, blood clot (thrombus formation), and puncture of an artery; nerve damage and irregular heat beat.  Alternatives to this procedure were also discussed.  PICC/Midline Placement Documentation  PICC / Midline Double Lumen 02/14/13 PICC Right Basilic (Active)  Indication for Insertion or Continuance of Line Limited venous access - need for IV therapy >5 days (PICC only) 02/14/2013  8:30 AM  Length mark (cm) 1 cm 02/14/2013  8:30 AM  Site Assessment Clean;Dry;Intact 02/14/2013  8:30 AM  Lumen #1 Status Flushed;Saline locked;Blood return noted 02/14/2013  8:30 AM  Lumen #2 Status Flushed;Saline locked;Blood return noted 02/14/2013  8:30 AM  Dressing Type Transparent;Gauze 02/14/2013  8:30 AM  Dressing Status Clean;Dry;Intact;Antimicrobial disc in place 02/14/2013  8:30 AM  Dressing Intervention New dressing 02/14/2013  8:30 AM  Dressing Change Due 02/21/13 02/14/2013  8:30 AM       Ethelda Chick 02/14/2013, 8:40 AM

## 2013-02-14 NOTE — Progress Notes (Signed)
Event: Notified by RN that pt transitioned off insulin qtt at approx 1600. AG at that time was 13, C02-17, SG-167. At 1750 AG-14 and C02-18, SG-177, at 2055 AG-14 and C02-17 and SG-265. She has received Lantus 5 units and 4 units of Novolog at HS for CBG of now 317. Pt is reported to continue to c/o mild nausea w/o vomiting and no desire to eat.  Assessment/Plan: 1. DKA: (Felt secondary to poorly controlled DM) AG/SG up, C02 continues to drop. I have restarted DKA protocol and insulin drip. Will keep pt NPO for now except for diet soda and water. Will continue to obtain q2h B-Mets for now.  Discussed plan with Dr Alvester Morin who is in agreement with plan. Will continue to monitor closely in SDU.  Leanne Chang, NP-C [facility] [phone]

## 2013-02-15 LAB — BASIC METABOLIC PANEL
BUN: 6 mg/dL (ref 6–23)
BUN: 5 mg/dL — ABNORMAL LOW (ref 6–23)
BUN: 4 mg/dL — ABNORMAL LOW (ref 6–23)
BUN: 4 mg/dL — ABNORMAL LOW (ref 6–23)
CO2: 15 mEq/L — ABNORMAL LOW (ref 19–32)
CO2: 16 mEq/L — ABNORMAL LOW (ref 19–32)
CO2: 19 mEq/L (ref 19–32)
CO2: 20 mEq/L (ref 19–32)
Calcium: 8.4 mg/dL (ref 8.4–10.5)
Calcium: 8.1 mg/dL — ABNORMAL LOW (ref 8.4–10.5)
Calcium: 7.8 mg/dL — ABNORMAL LOW (ref 8.4–10.5)
Calcium: 8.1 mg/dL — ABNORMAL LOW (ref 8.4–10.5)
Chloride: 98 mEq/L (ref 96–112)
Chloride: 102 mEq/L (ref 96–112)
Chloride: 102 mEq/L (ref 96–112)
Chloride: 107 mEq/L (ref 96–112)
Creatinine, Ser: 0.71 mg/dL (ref 0.50–1.10)
Creatinine, Ser: 0.69 mg/dL (ref 0.50–1.10)
Creatinine, Ser: 0.71 mg/dL (ref 0.50–1.10)
Creatinine, Ser: 0.71 mg/dL (ref 0.50–1.10)
GFR calc Af Amer: >90 mL/min (ref >90)
GFR calc Af Amer: >90 mL/min (ref >90)
GFR calc Af Amer: >90 mL/min (ref >90)
GFR calc Af Amer: >90 mL/min (ref >90)
GFR calc non Af Amer: >90 mL/min (ref >90)
GFR calc non Af Amer: >90 mL/min (ref >90)
GFR calc non Af Amer: >90 mL/min (ref >90)
GFR calc non Af Amer: >90 mL/min (ref >90)
Glucose, Bld: 320 mg/dL — ABNORMAL HIGH (ref 70–99)
Glucose, Bld: 199 mg/dL — ABNORMAL HIGH (ref 70–99)
Glucose, Bld: 169 mg/dL — ABNORMAL HIGH (ref 70–99)
Glucose, Bld: 140 mg/dL — ABNORMAL HIGH (ref 70–99)
Potassium: 4.1 mEq/L (ref 3.5–5.1)
Potassium: 3.7 mEq/L (ref 3.5–5.1)
Potassium: 3.6 mEq/L (ref 3.5–5.1)
Potassium: 3.9 mEq/L (ref 3.5–5.1)
Sodium: 130 mEq/L — ABNORMAL LOW (ref 135–145)
Sodium: 132 mEq/L — ABNORMAL LOW (ref 135–145)
Sodium: 131 mEq/L — ABNORMAL LOW (ref 135–145)
Sodium: 135 mEq/L (ref 135–145)

## 2013-02-15 LAB — GLUCOSE, CAPILLARY
Glucose-Capillary: 184 mg/dL — ABNORMAL HIGH (ref 70–99)
Glucose-Capillary: 202 mg/dL — ABNORMAL HIGH (ref 70–99)
Glucose-Capillary: 136 mg/dL — ABNORMAL HIGH (ref 70–99)
Glucose-Capillary: 160 mg/dL — ABNORMAL HIGH (ref 70–99)
Glucose-Capillary: 186 mg/dL — ABNORMAL HIGH (ref 70–99)
Glucose-Capillary: 204 mg/dL — ABNORMAL HIGH (ref 70–99)
Glucose-Capillary: 233 mg/dL — ABNORMAL HIGH (ref 70–99)
Glucose-Capillary: 156 mg/dL — ABNORMAL HIGH (ref 70–99)
Glucose-Capillary: 175 mg/dL — ABNORMAL HIGH (ref 70–99)
Glucose-Capillary: 52 mg/dL — ABNORMAL LOW (ref 70–99)
Glucose-Capillary: 52 mg/dL — ABNORMAL LOW (ref 70–99)
Glucose-Capillary: 125 mg/dL — ABNORMAL HIGH (ref 70–99)

## 2013-02-15 LAB — URINALYSIS, ROUTINE W REFLEX MICROSCOPIC
Bilirubin Urine: NEGATIVE
Glucose, UA: >1000 mg/dL — AB
Hgb urine dipstick: NEGATIVE
Ketones, ur: 40 mg/dL — AB
Leukocytes, UA: NEGATIVE
Nitrite: NEGATIVE
Protein, ur: 30 mg/dL — AB
Specific Gravity, Urine: 1.021 (ref 1.005–1.030)
Urobilinogen, UA: 0.2 mg/dL (ref 0.0–1.0)
pH: 6 (ref 5.0–8.0)

## 2013-02-15 LAB — URINE MICROSCOPIC-ADD ON

## 2013-02-15 MED ORDER — INSULIN ASPART 100 UNIT/ML ~~LOC~~ SOLN
0.0000 [IU] | Freq: Three times a day (TID) | SUBCUTANEOUS | Status: DC
  Administered 2013-02-15 (×2): 2 [IU] via SUBCUTANEOUS
  Administered 2013-02-16 (×3): 5 [IU] via SUBCUTANEOUS
  Administered 2013-02-17: 11 [IU] via SUBCUTANEOUS

## 2013-02-15 MED ORDER — LORAZEPAM 2 MG/ML IJ SOLN
1.0000 mg | Freq: Four times a day (QID) | INTRAMUSCULAR | Status: DC | PRN
  Administered 2013-02-15 – 2013-02-16 (×4): 1 mg via INTRAVENOUS
  Filled 2013-02-15 (×4): qty 1

## 2013-02-15 MED ORDER — PROMETHAZINE HCL 25 MG/ML IJ SOLN
25.0000 mg | Freq: Four times a day (QID) | INTRAMUSCULAR | Status: DC | PRN
  Administered 2013-02-15: 12.5 mg via INTRAVENOUS
  Administered 2013-02-15: 25 mg via INTRAVENOUS
  Filled 2013-02-15 (×2): qty 1

## 2013-02-15 MED ORDER — INSULIN GLARGINE 100 UNIT/ML ~~LOC~~ SOLN
5.0000 [IU] | Freq: Every day | SUBCUTANEOUS | Status: DC
  Administered 2013-02-15 – 2013-02-16 (×2): 5 [IU] via SUBCUTANEOUS
  Filled 2013-02-15 (×3): qty 0.05

## 2013-02-15 MED ORDER — DEXTROSE-NACL 5-0.9 % IV SOLN
INTRAVENOUS | Status: DC
  Administered 2013-02-15: 14:00:00 via INTRAVENOUS
  Administered 2013-02-16: 1000 mL via INTRAVENOUS
  Administered 2013-02-17: 05:00:00 via INTRAVENOUS

## 2013-02-15 MED ORDER — FENTANYL CITRATE 0.05 MG/ML IJ SOLN: 50.0000 ug | INTRAMUSCULAR | Status: DC | PRN

## 2013-02-15 MED ORDER — HYDRALAZINE HCL 20 MG/ML IJ SOLN: 10.0000 mg | INTRAMUSCULAR | Status: DC | PRN

## 2013-02-15 MED ORDER — INSULIN GLARGINE 100 UNIT/ML ~~LOC~~ SOLN
5.0000 [IU] | Freq: Once | SUBCUTANEOUS | Status: AC
  Administered 2013-02-15: 5 [IU] via SUBCUTANEOUS
  Filled 2013-02-15: qty 0.05

## 2013-02-15 MED ORDER — INSULIN ASPART 100 UNIT/ML ~~LOC~~ SOLN
0.0000 [IU] | Freq: Every day | SUBCUTANEOUS | Status: DC
  Administered 2013-02-15: 4 [IU] via SUBCUTANEOUS
  Administered 2013-02-16: 5 [IU] via SUBCUTANEOUS

## 2013-02-15 NOTE — Progress Notes (Signed)
Pt having trouble trying to void, seems to be unable to empty her bladder.  Bladder scan, shows pt has >927ml in bladder.  Foley placed per orders.  1100 cc yellow urine returned, Urinalysis sent.  Conley Rolls, RN

## 2013-02-15 NOTE — Progress Notes (Signed)
[facility] PROGRESS NOTE  [name] MWU:[id number] DOB: 1966-03-15 DOA: 02/12/2013 PCP: Lonia Blood, MD  Brief narrative: 47 year old female with past medical history of diabetes mellitus type 1, related diabetic neuropathy who presented to [facility] ED with complaints of ongoing nausea, vomiting. In ED vital signs remained stable with blood pressure 110/54, heart rate 99 and T 98.1 F. oxygen saturation was 99% on room air. CBC was unremarkable. BMP revealed sodium of 133, CO2 of 13 and creatinine of 1.13. Patient was found to have anion gap of 21. Patient was not started on insulin drip at the time of the admission. Additional BMP's revealed patient is still in DKA for which reason insulin drip was started and pt was transferred to step down unit for further management. She was improving and insulin drip was discontinued but overnight restarted as BMP again revealed AG of 14.  Assessment/Plan:   Principal Problem:  DKA (diabetic ketoacidoses)  - Secondary to poorly controlled diabetes. A1c in May 2014 is 10.4 indicating poor glycemic control  - Initial anion gap was 21 and slowly trending down 19 -->15 to WNL but then up again to 14 so insulin drip restarted overnight. Now for 2 consecutive measurements AG closed so insulin drip off - order basal Lantus 5 units at bedtime - CBG's in past 24 hours: 307, 202, 184 - keep NPO  - antiemetics as needed; increased phenergan to 25 mg every 6 hours PRN Active Problems:  High anion gap metabolic acidosis  - Related to diabetic ketoacidosis  - Management as above, off of insulin drip Abdominal pain  - likely related to DKA  - discontinued dilaudid as it makes pt very nauseaus and started fentanyl 50 mcg every 2 hours IV and continue norco 1-2 tabs every 4 hours PO PRN moderate pain  - keep NPO except for meds Hypertension  - May use hydralazine IV when necessary but increase from 5 mg to 10 mg  every 4 hours  - pt was hypotensive overnight but  now SBP 180 Diabetic neuropathy  - Continue pregabalin 150 mg twice a day   Code Status: Full code  Family Communication: No family at the bedside  Disposition Plan: home when stable   Manson Passey, MD  [facility]  Pager [phone]   Consultants:  Diabetic coordinator Procedures:  None Antibiotics:  None   If 7PM-7AM, please contact night-coverage [email or web address] Password [email or web address] 02/15/2013, 7:10 AM   LOS: 3 days    HPI/Subjective: Feels nauseous.   Objective: Filed Vitals:   02/14/13 2041 02/14/13 2200 02/15/13 0000 02/15/13 0400  BP: 187/74 175/82 177/84 94/43  Pulse:  118 118 91  Temp:   98.6 F (37 C) 98.7 F (37.1 C)  TempSrc:   Oral Oral  Resp:  15 15 15   Height:      Weight:      SpO2:  100% 100% 98%    Intake/Output Summary (Last 24 hours) at 02/15/13 0710 Last data filed at 02/15/13 0555  Gross per 24 hour  Intake 2609.44 ml  Output   2501 ml  Net 108.44 ml    Exam:   General:  Pt is alert, follows commands appropriately, not in acute distress  Cardiovascular: Regular rate and rhythm, S1/S2, no murmurs, no rubs, no gallops  Respiratory: Clear to auscultation bilaterally, no wheezing, no crackles, no rhonchi  Abdomen: Soft, non tender, non distended, bowel sounds present, no guarding  Extremities: No edema, pulses DP and PT palpable bilaterally  Neuro: Grossly nonfocal  Data  Reviewed: Basic Metabolic Panel:  Recent Labs Lab 02/14/13 2055 02/14/13 2319 02/15/13 0104 02/15/13 0300 02/15/13 0504  NA 131* 130* 132* 131* 135  K 4.5 4.1 3.7 3.6 3.9  CL 100 98 102 102 107  CO2 17* 15* 16* 19 20  GLUCOSE 265* 320* 199* 169* 140*  BUN 5* 6 5* 4* 4*  CREATININE 0.75 0.71 0.69 0.71 0.71  CALCIUM 8.3* 8.4 8.1* 7.8* 8.1*   Liver Function Tests:  Recent Labs Lab 02/12/13 1838  AST 24  ALT 23  ALKPHOS 118*  BILITOT 0.7  PROT 9.0*  ALBUMIN 4.4   No results found for this basename: LIPASE, AMYLASE,  in the last 168 hours No results found  for this basename: AMMONIA,  in the last 168 hours CBC:  Recent Labs Lab 02/12/13 1838 02/13/13 1740  WBC 8.9 15.6*  NEUTROABS 6.2  --   HGB 13.3 11.5*  HCT 40.0 35.6*  MCV 80.3 83.0  PLT 359 347   Cardiac Enzymes: No results found for this basename: CKTOTAL, CKMB, CKMBINDEX, TROPONINI,  in the last 168 hours BNP: No components found with this basename: POCBNP,  CBG:  Recent Labs Lab 02/14/13 1607 02/14/13 2209 02/14/13 2325 02/15/13 0032 02/15/13 0142  GLUCAP 160* 317* 307* 202* 184*    Recent Results (from the past 240 hour(s))  MRSA PCR SCREENING     Status: None   Collection Time    02/13/13  3:03 PM      Result Value Range Status   MRSA by PCR NEGATIVE  NEGATIVE Final     Studies: No results found.  Scheduled Meds: . antiseptic oral rinse  15 mL Mouth Rinse BID  . enoxaparin (LOVENOX) injection  40 mg Subcutaneous QHS  . erythromycin ethylsuccinate  125 mg Oral Q8H  . insulin aspart  0-15 Units Subcutaneous TID WC  . insulin aspart  0-5 Units Subcutaneous QHS  . pregabalin  150 mg Oral BID  . sodium chloride  10-40 mL Intracatheter Q12H   Continuous Infusions: . sodium chloride 75 mL/hr at 02/15/13 0707

## 2013-02-16 ENCOUNTER — Inpatient Hospital Stay (HOSPITAL_COMMUNITY): Payer: 59

## 2013-02-16 LAB — BASIC METABOLIC PANEL
BUN: 4 mg/dL — ABNORMAL LOW (ref 6–23)
CO2: 22 mEq/L (ref 19–32)
Chloride: 102 mEq/L (ref 96–112)
Creatinine, Ser: 0.77 mg/dL (ref 0.50–1.10)
Glucose, Bld: 288 mg/dL — ABNORMAL HIGH (ref 70–99)

## 2013-02-16 LAB — GLUCOSE, CAPILLARY
Glucose-Capillary: 306 mg/dL — ABNORMAL HIGH (ref 70–99)
Glucose-Capillary: 249 mg/dL — ABNORMAL HIGH (ref 70–99)

## 2013-02-16 MED ORDER — POLYETHYLENE GLYCOL 3350 17 G PO PACK
17.0000 g | PACK | Freq: Every day | ORAL | Status: DC
  Administered 2013-02-16: 17 g via ORAL
  Filled 2013-02-16 (×2): qty 1

## 2013-02-16 MED ORDER — AMLODIPINE BESYLATE 5 MG PO TABS
5.0000 mg | ORAL_TABLET | Freq: Every day | ORAL | Status: DC
  Administered 2013-02-16 – 2013-02-17 (×2): 5 mg via ORAL
  Filled 2013-02-16 (×2): qty 1

## 2013-02-16 MED ORDER — DOCUSATE SODIUM 100 MG PO CAPS
100.0000 mg | ORAL_CAPSULE | Freq: Two times a day (BID) | ORAL | Status: DC
  Administered 2013-02-16 – 2013-02-17 (×3): 100 mg via ORAL
  Filled 2013-02-16 (×4): qty 1

## 2013-02-16 MED ORDER — HYDROMORPHONE HCL PF 1 MG/ML IJ SOLN: 1.0000 mg | INTRAMUSCULAR | Status: DC | PRN

## 2013-02-16 MED ORDER — PROCHLORPERAZINE EDISYLATE 5 MG/ML IJ SOLN: 10.0000 mg | Freq: Four times a day (QID) | INTRAMUSCULAR | Status: DC | PRN

## 2013-02-16 NOTE — Progress Notes (Addendum)
[facility] PROGRESS NOTE  [name] ZOX:[id number] DOB: 10/04/1965 DOA: 02/12/2013 PCP: Lonia Blood, MD  Brief narrative: 47 year old female with past medical history of diabetes mellitus type 1, related diabetic neuropathy who presented to [facility] ED with complaints of ongoing nausea, vomiting. In ED vital signs remained stable with blood pressure 110/54, heart rate 99 and T 98.1 F. oxygen saturation was 99% on room air. CBC was unremarkable. BMP revealed sodium of 133, CO2 of 13 and creatinine of 1.13. Patient was found to have anion gap of 21. Patient was not started on insulin drip at the time of the admission. Additional BMP's revealed patient is still in DKA for which reason insulin drip was started and pt was transferred to step down unit for further management. She was improving and insulin drip was discontinued but overnight restarted as BMP again revealed AG of 14. Patient is now off of insulin drip, and anion gap is within normal limits. She has been transitioned to subcutaneous Lantus. Patient refuses to eat which make insulin adjustment somewhat challenging. Patient complains of abdominal pain which is disproportionate to physical exam findings.  Assessment/Plan:   Principal Problem:  DKA (diabetic ketoacidoses)  - Secondary to poorly controlled diabetes. A1c in May 2014 is 10.4 indicating poor glycemic control  - Initial anion gap was 21 and slowly trending down 19 -->15 to WNL but then up again to 14 so insulin drip restarted. Now anion gap has closed for past 24 hours and patient is off of insulin drip. - Patient is on Lantus 5 units at bedtime. Patient refuses to eat so not on short acting insulin regimen - Continue sliding scale insulin - CBGs in the past 24 hours: 306, 249, 215 - antiemetics as needed; increased phenergan to 25 mg every 6 hours PRN and added compazine for refractory nausea and/or vomiting; pt reported she had emesis but per PRN it was phlegm and no actual  emesis  Active Problems:  High anion gap metabolic acidosis  - Related to diabetic ketoacidosis  - resolved Abdominal pain  - likely related to DKA versus narcotic dependence - obtain abdominal X ray - Discontinue fentanyl as patient reports no pain relief with this regimen. She preferred to be back on Dilaudid, 1-2 mg every 3 hours as needed IV Hypertension  - Order placed for Norvasc 5 mg daily. Use hydralazine when necessary for better blood pressure control. Diabetic neuropathy  - Continue pregabalin 150 mg twice a day   Code Status: Full code  Family Communication: No family at the bedside  Disposition Plan: home when stable   Manson Passey, MD  [facility]  Pager [phone]   Consultants:  Diabetic coordinator Procedures:  None Antibiotics:  None   If 7PM-7AM, please contact night-coverage [email or web address] Password [facility] 02/16/2013, 1:04 PM   LOS: 4 days    HPI/Subjective: Still complains of abdominal pain, anxious.  Objective: Filed Vitals:   02/15/13 2206 02/16/13 0250 02/16/13 0530 02/16/13 0940  BP: 129/62 103/46 131/64 168/75  Pulse: 105 90 98 110  Temp: 98.9 F (37.2 C) 98.4 F (36.9 C) 98.5 F (36.9 C) 98.9 F (37.2 C)  TempSrc: Oral Oral Oral Oral  Resp: 16 16 16 16   Height:      Weight:      SpO2: 100% 98% 100% 100%    Intake/Output Summary (Last 24 hours) at 02/16/13 1304 Last data filed at 02/16/13 1000  Gross per 24 hour  Intake    312 ml  Output   2240  ml  Net  -1928 ml    Exam:   General:  Pt is alert, follows commands appropriately, not in acute distress  Cardiovascular: Regular rate and rhythm, S1/S2, no murmurs, no rubs, no gallops  Respiratory: Clear to auscultation bilaterally, no wheezing, no crackles, no rhonchi  Abdomen: Soft, non tender, non distended, bowel sounds present, no guarding  Extremities: No edema, pulses DP and PT palpable bilaterally  Neuro: Grossly nonfocal  Data Reviewed: Basic Metabolic Panel:  Recent  Labs Lab 02/14/13 2319 02/15/13 0104 02/15/13 0300 02/15/13 0504 02/16/13 0715  NA 130* 132* 131* 135 135  K 4.1 3.7 3.6 3.9 3.0*  CL 98 102 102 107 102  CO2 15* 16* 19 20 22   GLUCOSE 320* 199* 169* 140* 288*  BUN 6 5* 4* 4* 4*  CREATININE 0.71 0.69 0.71 0.71 0.77  CALCIUM 8.4 8.1* 7.8* 8.1* 8.3*   Liver Function Tests:  Recent Labs Lab 02/12/13 1838  AST 24  ALT 23  ALKPHOS 118*  BILITOT 0.7  PROT 9.0*  ALBUMIN 4.4   No results found for this basename: LIPASE, AMYLASE,  in the last 168 hours No results found for this basename: AMMONIA,  in the last 168 hours CBC:  Recent Labs Lab 02/12/13 1838 02/13/13 1740  WBC 8.9 15.6*  NEUTROABS 6.2  --   HGB 13.3 11.5*  HCT 40.0 35.6*  MCV 80.3 83.0  PLT 359 347   CBG:  Recent Labs Lab 02/15/13 1313 02/15/13 1701 02/15/13 2217 02/16/13 0742 02/16/13 1153  GLUCAP 125* 135* 306* 249* 215*    MRSA PCR SCREENING     Status: None   Collection Time    02/13/13  3:03 PM      Result Value Range Status   MRSA by PCR NEGATIVE  NEGATIVE Final     Studies: No results found.  Scheduled Meds: . amLODipine  5 mg Oral Daily  . erythromycin   125 mg Oral Q8H  . insulin aspart  0-15 Units Subcutaneous TID WC  . insulin aspart  0-5 Units Subcutaneous QHS  . insulin glargine  5 Units Subcutaneous QHS  . pregabalin  150 mg Oral BID   Continuous Infusions: . dextrose 5 % and 0.9% NaCl 75 mL/hr at 02/15/13 1427

## 2013-02-16 NOTE — Progress Notes (Signed)
Pt sleeping all day. Abd.x-ray done- it was normal. Medicated x1 for nausea this AM, remains very drowsy and complaining of pain in her abdomen. Encouraged to try and eat something. CBG's have been in the 200's.

## 2013-02-17 DIAGNOSIS — E109 Type 1 diabetes mellitus without complications: Secondary | ICD-10-CM

## 2013-02-17 LAB — BASIC METABOLIC PANEL
BUN: 5 mg/dL — ABNORMAL LOW (ref 6–23)
CO2: 25 mEq/L (ref 19–32)
Calcium: 7.8 mg/dL — ABNORMAL LOW (ref 8.4–10.5)
GFR calc non Af Amer: 83 mL/min — ABNORMAL LOW (ref >90)
Glucose, Bld: 357 mg/dL — ABNORMAL HIGH (ref 70–99)
Potassium: 3.2 mEq/L — ABNORMAL LOW (ref 3.5–5.1)

## 2013-02-17 LAB — GLUCOSE, CAPILLARY
Glucose-Capillary: 234 mg/dL — ABNORMAL HIGH (ref 70–99)
Glucose-Capillary: 190 mg/dL — ABNORMAL HIGH (ref 70–99)
Glucose-Capillary: 242 mg/dL — ABNORMAL HIGH (ref 70–99)

## 2013-02-17 MED ORDER — INSULIN ASPART 100 UNIT/ML ~~LOC~~ SOLN: 5.0000 [IU] | Freq: Three times a day (TID) | SUBCUTANEOUS | Status: DC

## 2013-02-17 MED ORDER — PROMETHAZINE HCL 12.5 MG PO TABS
12.5000 mg | ORAL_TABLET | Freq: Four times a day (QID) | ORAL | Status: DC | PRN
Start: 2013-02-17 — End: 2014-10-23

## 2013-02-17 MED ORDER — HYDROCODONE-ACETAMINOPHEN 5-325 MG PO TABS: 1.0000 | ORAL_TABLET | ORAL | Status: DC | PRN

## 2013-02-17 MED ORDER — INSULIN GLARGINE 100 UNIT/ML ~~LOC~~ SOLN
15.0000 [IU] | Freq: Every day | SUBCUTANEOUS | Status: DC
  Filled 2013-02-17: qty 0.15

## 2013-02-17 MED ORDER — SODIUM CHLORIDE 0.9 % IV SOLN
INTRAVENOUS | Status: DC
  Administered 2013-02-17: 10:00:00 via INTRAVENOUS

## 2013-02-17 MED ORDER — PREGABALIN 150 MG PO CAPS: 150.0000 mg | ORAL_CAPSULE | Freq: Two times a day (BID) | ORAL | Status: DC

## 2013-02-17 MED ORDER — GLUCERNA SHAKE PO LIQD
237.0000 mL | Freq: Two times a day (BID) | ORAL | Status: DC
  Administered 2013-02-17: 237 mL via ORAL
  Filled 2013-02-17 (×2): qty 237

## 2013-02-17 MED ORDER — INSULIN ASPART 100 UNIT/ML ~~LOC~~ SOLN: 0.0000 [IU] | Freq: Every day | SUBCUTANEOUS | Status: DC

## 2013-02-17 MED ORDER — LORAZEPAM 1 MG PO TABS: 1.0000 mg | ORAL_TABLET | Freq: Two times a day (BID) | ORAL | Status: DC | PRN

## 2013-02-17 MED ORDER — INSULIN DETEMIR 100 UNIT/ML ~~LOC~~ SOLN: 15.0000 [IU] | Freq: Every day | SUBCUTANEOUS | Status: DC

## 2013-02-17 MED ORDER — INSULIN ASPART 100 UNIT/ML ~~LOC~~ SOLN
0.0000 [IU] | Freq: Three times a day (TID) | SUBCUTANEOUS | Status: DC
  Administered 2013-02-17: 4 [IU] via SUBCUTANEOUS

## 2013-02-17 MED ORDER — ONDANSETRON HCL 4 MG PO TABS: 4.0000 mg | ORAL_TABLET | Freq: Four times a day (QID) | ORAL | Status: DC | PRN

## 2013-02-17 MED ORDER — DSS 100 MG PO CAPS: 100.0000 mg | ORAL_CAPSULE | Freq: Two times a day (BID) | ORAL | Status: DC | PRN

## 2013-02-17 MED ORDER — INSULIN ASPART 100 UNIT/ML ~~LOC~~ SOLN: 4.0000 [IU] | Freq: Three times a day (TID) | SUBCUTANEOUS | Status: DC

## 2013-02-17 NOTE — Progress Notes (Signed)
Nutrition Education Note  RD consulted for nutrition education regarding diabetes.   Lab Results  Component Value Date   HGBA1C 10.4* 11/05/2012    RD provided "Carbohydrate Counting for People with Diabetes" handout from the Academy of [employer] and [employer]etics. Discussed different food groups and their effects on blood sugar, emphasizing carbohydrate-containing foods. Provided list of carbohydrates and recommended serving sizes of common foods.  Discussed importance of controlled and consistent carbohydrate intake throughout the day. Provided examples of ways to balance meals/snacks and encouraged intake of high-fiber, whole grain complex carbohydrates. Teach back method used.  Pt admits to drinking juice and sweet tea and sometimes putting regular sugar in hot tea for breakfast. Pt thinks her carbohydrate portion sizes can sometimes be too big. Pt typically has 3 meals/day plus a snack. Healthy snack options discussed.   Expect good compliance.  Body mass index is 25.03 kg/(m^2). Pt meets criteria for overweight based on current BMI.  Current diet order is CHO medium, patient is consuming approximately 10% of meals at this time. Labs and medications reviewed. Potassium slightly low. Noted possible d/c today. Will order Glucerna shakes for additional nutrition. No further nutrition interventions warranted at this time. RD contact information provided. If additional nutrition issues arise, please re-consult RD.  Levon Hedger MS, RD, LDN [phone] Pager [phone] After Hours Pager

## 2013-02-17 NOTE — Progress Notes (Signed)
Pt discharged home in stable condition. Discharge instructions and scripts given. Pt verbalized understanding 

## 2013-02-17 NOTE — Progress Notes (Signed)
Stopped to see patient today. She was resting. Says she is going to have a visit from the pet therapy dog, Olive, later today. Offered short prayer for peace and calm .

## 2013-02-17 NOTE — Progress Notes (Signed)
Inpatient Diabetes Program Recommendations  AACE/ADA: New Consensus Statement on Inpatient Glycemic Control (2013)  Target Ranges:  Prepandial:   less than 140 mg/dL      Peak postprandial:   less than 180 mg/dL (1-2 hours)      Critically ill patients:  140 - 180 mg/dL   Reason for Visit: Hyperglycemia  Results for ALESI, ZACHERY (MRN [id number]) as of 02/17/2013 09:15  Ref. Range 02/16/2013 07:42 02/16/2013 11:53 02/16/2013 18:28 02/16/2013 21:08 02/17/2013 08:39  Glucose-Capillary Latest Range: 70-99 mg/dL 782 (H) 956 (H) 213 (H) 359 (H) 329 (H)   Continues with poor po intake.  Recommendations:  Increase Lantus to 15 units QHS If poor po intake continues, change Novolog correction to Q4 hours. When pt eating, will need meal coverage insulin - Novolog 4 units tidwc if pt eats >50% meal  Will continue to follow.    Thank you. Ailene Ards, RD, LDN, CDE Inpatient Diabetes Coordinator [phone]

## 2013-02-17 NOTE — Discharge Summary (Signed)
Physician Discharge Summary  [name] RUE:[id number] DOB: 1965/11/18 DOA: 02/12/2013  PCP: Lonia Blood, MD  Admit date: 02/12/2013 Discharge date: 02/17/2013  Recommendations for Outpatient Follow-up:  1. please note that you were hospitalized for diabetic ketoacidosis. He required insulin drip on the admission but your blood work has normalized and you have been transitioned to subcutaneous insulin  2. per diabetic coordinator recommendations please continue taking Levemir but we have changed the dosage to 15 units at bedtime instead of 20 units at bedtime. We also added NovoLog 4 units 3 times daily with meals which you will use if you're eating. You may continue insulin sliding scale only if your blood sugar is still high once you used NovoLog 4 units with meals  3. please continue monitoring blood sugars with meals and at bedtime and present the log of CBG's to your primary care provider during your next visit.  4. we have prescribed Norco by mouth as needed for pain control should you need pain medications  5. continue taking lisinopril 10 mg daily for blood pressure control  6. please continue taking your previous dosage of Lasix for swelling. Lasix can cause kidney function decline so make sure your kidney function is checked during your next visit with primary care provider  7. your kidney function was within normal limits throughout the hospital stay  8. you may continue taking Ativan 1 mg as needed every 12 hours for anxiety   Discharge Diagnoses:  Principal Problem:   DKA (diabetic ketoacidoses) Active Problems:   High anion gap metabolic acidosis   DIABETES MELLITUS, TYPE I   Hypertension   Diabetic neuropathy   Abdominal  pain, other specified site    Discharge Condition: Medically stable for discharge home today  Diet recommendation: Diabetic, heart healthy diet  History of present illness:  47 year old female with past medical history of diabetes mellitus  type 1, related diabetic neuropathy who presented to [facility] ED with complaints of ongoing nausea, vomiting. In ED vital signs remained stable with blood pressure 110/54, heart rate 99 and T 98.1 F. oxygen saturation was 99% on room air. CBC was unremarkable. BMP revealed sodium of 133, CO2 of 13 and creatinine of 1.13. Patient was found to have anion gap of 21. Patient was not started on insulin drip at the time of the admission. Additional BMP's revealed patient is still in DKA for which reason insulin drip was started and pt was transferred to step down unit for further management. She was improving and insulin drip was discontinued but then restarted as BMP again revealed AG of 14. Patient is now off of insulin drip for past 48 hours as anion gap has normalized.  Assessment/Plan:   Principal Problem:  DKA (diabetic ketoacidoses)  - Secondary to poorly controlled diabetes. A1c in May 2014 is 10.4 indicating poor glycemic control  - Initial anion gap was 21 --> 19 --> 15 to WNL but then up again to 14 so insulin drip restarted. Anion gap has closed and off of insulin drip for past 48 hours - Patient tolerates regular diet, carb modified diet - Per diabetic coordinator recommendations we will continue Levemir 15 units at bedtime in addition to NovoLog 4 units 3 times a day with meals and sliding scale only if needed if sugars are not controlled with the admission regimen.  Active Problems:  High anion gap metabolic acidosis  - Related to diabetic ketoacidosis  - resolved  Abdominal pain  - likely related to DKA versus narcotic dependence  -  No acute findings on abdominal x-ray done 02/16/2013 - Norco prescribed for pain control should pain continue on discharge Hypertension  - Patient takes lisinopril 10 mg daily at home and she can continue taking this medication on discharge  Diabetic neuropathy  - Continue pregabalin 150 mg twice a day    Code Status: Full code  Family Communication: No  family at the bedside   Manson Passey, MD  [facility]  Pager [phone]   Consultants:  Diabetic coordinator Procedures:  None Antibiotics:  None   Discharge Exam: Filed Vitals:   02/17/13 0946  BP: 112/68  Pulse: 96  Temp: 98.2 F (36.8 C)  Resp: 16   Filed Vitals:   02/16/13 2152 02/17/13 0300 02/17/13 0634 02/17/13 0946  BP:  94/46 117/53 112/68  Pulse: 105 91 93 96  Temp: 97.1 F (36.2 C) 98.5 F (36.9 C) 98 F (36.7 C) 98.2 F (36.8 C)  TempSrc: Oral Oral Oral Oral  Resp:  16 16 16   Height:      Weight:      SpO2:  99% 100% 98%    General: Pt is alert, follows commands appropriately, not in acute distress Cardiovascular: Regular rate and rhythm, S1/S2 +, no murmurs, no rubs, no gallops Respiratory: Clear to auscultation bilaterally, no wheezing, no crackles, no rhonchi Abdominal: Soft, non tender, non distended, bowel sounds +, no guarding Extremities: no edema, no cyanosis, pulses palpable bilaterally DP and PT Neuro: Grossly nonfocal  Discharge Instructions  Discharge Orders   Future Orders Complete By Expires   Call MD for:  difficulty breathing, headache or visual disturbances  As directed    Call MD for:  persistant dizziness or light-headedness  As directed    Call MD for:  persistant nausea and vomiting  As directed    Call MD for:  severe uncontrolled pain  As directed    Diet - low sodium heart healthy  As directed    Diet Carb Modified  As directed    Discharge instructions  As directed    Comments:     1. please note that you were hospitalized for diabetic ketoacidosis. He required insulin drip on the admission but your blood work has normalized and you have been transitioned to subcutaneous insulin 2. per diabetic coordinator recommendations please continue taking Levemir but we have changed the dosage to 15 units at bedtime instead of 20 units at bedtime. We also added NovoLog 4 units 3 times daily with meals which you will use if you're eating. You  may continue insulin sliding scale only if your blood sugar is still high once you used NovoLog 4 units with meals 3. please continue monitoring blood sugars with meals and at bedtime and present the log of CBG's to your primary care provider during your next visit. 4. we have prescribed Norco by mouth as needed for pain control should you need pain medications 5. continue taking lisinopril 10 mg daily for blood pressure control 6. please continue taking your previous dosage of Lasix for swelling. Lasix can cause kidney function decline so make sure your kidney function is checked during your next visit with primary care provider 7. your kidney function was within normal limits throughout the hospital stay 8. you may continue taking Ativan 1 mg as needed every 12 hours for anxiety   Increase activity slowly  As directed        Medication List         aspirin 81 MG EC tablet  Take 1 tablet (81 mg total) by mouth daily.     DSS 100 MG Caps  Take 100 mg by mouth 2 (two) times daily as needed for constipation.     erythromycin ethylsuccinate 200 MG/5ML suspension  Commonly known as:  EES  125 mg to take three times a day 30 minutes before meals     ferrous sulfate 325 (65 FE) MG tablet  Take 325 mg by mouth 3 (three) times a week.     furosemide 20 MG tablet  Commonly known as:  LASIX  Take 2 tablets (40 mg total) by mouth 2 (two) times daily.     HYDROcodone-acetaminophen 5-325 MG per tablet  Commonly known as:  NORCO/VICODIN  Take 1-2 tablets by mouth every 4 (four) hours as needed for pain.     insulin aspart 100 UNIT/ML injection  Commonly known as:  novoLOG  Inject 0-15 Units into the skin 3 (three) times daily with meals.     insulin aspart 100 UNIT/ML injection  Commonly known as:  novoLOG  Inject 4 Units into the skin 3 (three) times daily with meals.     insulin detemir 100 UNIT/ML injection  Commonly known as:  LEVEMIR  Inject 0.15 mLs (15 Units total) into the skin  at bedtime.     lisinopril 10 MG tablet  Commonly known as:  PRINIVIL,ZESTRIL  Take 10 mg by mouth every morning.     LORazepam 1 MG tablet  Commonly known as:  ATIVAN  Take 1 tablet (1 mg total) by mouth every 12 (twelve) hours as needed for anxiety.     ondansetron 4 MG tablet  Commonly known as:  ZOFRAN  Take 1 tablet (4 mg total) by mouth every 6 (six) hours as needed for nausea.     pregabalin 150 MG capsule  Commonly known as:  LYRICA  Take 1 capsule (150 mg total) by mouth 2 (two) times daily.     promethazine 12.5 MG tablet  Commonly known as:  PHENERGAN  Take 1 tablet (12.5 mg total) by mouth every 6 (six) hours as needed for nausea (for refractory anusea and/or vomiting).           Follow-up Information   Follow up with [facility], MD In 2 weeks.   Specialty:  Internal Medicine   Contact information:   [address] [place] [address]        The results of significant diagnostics from this hospitalization (including imaging, microbiology, ancillary and laboratory) are listed below for reference.    Significant Diagnostic Studies: Dg Abd Portable 1v  02/16/2013   *RADIOLOGY REPORT*  Clinical Data: Abdominal pain  PORTABLE ABDOMEN - 1 VIEW  Comparison: None.  Findings: Scattered large and small bowel gas is noted.  No abnormal mass or abnormal calcifications are seen.  No acute bony abnormality is noted.  No free air is seen.  Fecal material is noted throughout the colon.  IMPRESSION: No acute abnormality noted.   Original Report Authenticated By: Alcide Clever, M.D.    Microbiology: Recent Results (from the past 240 hour(s))  MRSA PCR SCREENING     Status: None   Collection Time    02/13/13  3:03 PM      Result Value Range Status   MRSA by PCR NEGATIVE  NEGATIVE Final   Comment:            The GeneXpert MRSA Assay (FDA     approved for NASAL specimens  only), is one component of a     comprehensive MRSA colonization     surveillance program. It  is not     intended to diagnose MRSA     infection nor to guide or     monitor treatment for     MRSA infections.     Labs: Basic Metabolic Panel:  Recent Labs Lab 02/15/13 0104 02/15/13 0300 02/15/13 0504 02/16/13 0715 02/17/13 0828  NA 132* 131* 135 135 136  K 3.7 3.6 3.9 3.0* 3.2*  CL 102 102 107 102 104  CO2 16* 19 20 22 25   GLUCOSE 199* 169* 140* 288* 357*  BUN 5* 4* 4* 4* 5*  CREATININE 0.69 0.71 0.71 0.77 0.84  CALCIUM 8.1* 7.8* 8.1* 8.3* 7.8*   Liver Function Tests:  Recent Labs Lab 02/12/13 1838  AST 24  ALT 23  ALKPHOS 118*  BILITOT 0.7  PROT 9.0*  ALBUMIN 4.4   No results found for this basename: LIPASE, AMYLASE,  in the last 168 hours No results found for this basename: AMMONIA,  in the last 168 hours CBC:  Recent Labs Lab 02/12/13 1838 02/13/13 1740  WBC 8.9 15.6*  NEUTROABS 6.2  --   HGB 13.3 11.5*  HCT 40.0 35.6*  MCV 80.3 83.0  PLT 359 347   Cardiac Enzymes: No results found for this basename: CKTOTAL, CKMB, CKMBINDEX, TROPONINI,  in the last 168 hours BNP: BNP (last 3 results)  Recent Labs  11/05/12 1821  PROBNP 158.2*   CBG:  Recent Labs Lab 02/16/13 1153 02/16/13 1828 02/16/13 2108 02/17/13 0839 02/17/13 1221  GLUCAP 215* 234* 359* 329* 190*    Time coordinating discharge: Over 30 minutes  Signed:  Manson Passey, MD  [facility]  02/17/2013, 1:00 PM  Pager #: [phone]

## 2013-02-17 NOTE — Care Management Note (Signed)
Cm spoke with patient at the bedside concerning discharge planning. MD order for [facility]. Per pt choice [facility] to provide [facility] services upon discharge. [facility] rep Lanae Crumbly notified of referral. Pt's boyfriend to provide tx home. No other barriers identified.   Roxy Manns Liberta Gimpel,RN,MSN [phone]

## 2013-02-17 NOTE — Progress Notes (Signed)
RUA PICC D/C per order.  Verbalizes understanding of dressing care, signs of infection and interventions for bleeding or infection if they occur.  Site without signs of bleeding or infection at this time.  Site cleaned with chlorhexadine , covered with vaseline guaze and dry 4x4.  No ADN.

## 2013-04-11 IMAGING — CR DG ABDOMEN 1V
1 series · 2 of 2 positions shown · non-contrast
Comparison: Prior abdominal radiographs 04/02/2012

CLINICAL DATA: Abdominal distension, pain

ABDOMEN - 1 VIEW

[Series 1: ap (kub) · U · 2 of 2 slices shown]
[im 1/2]
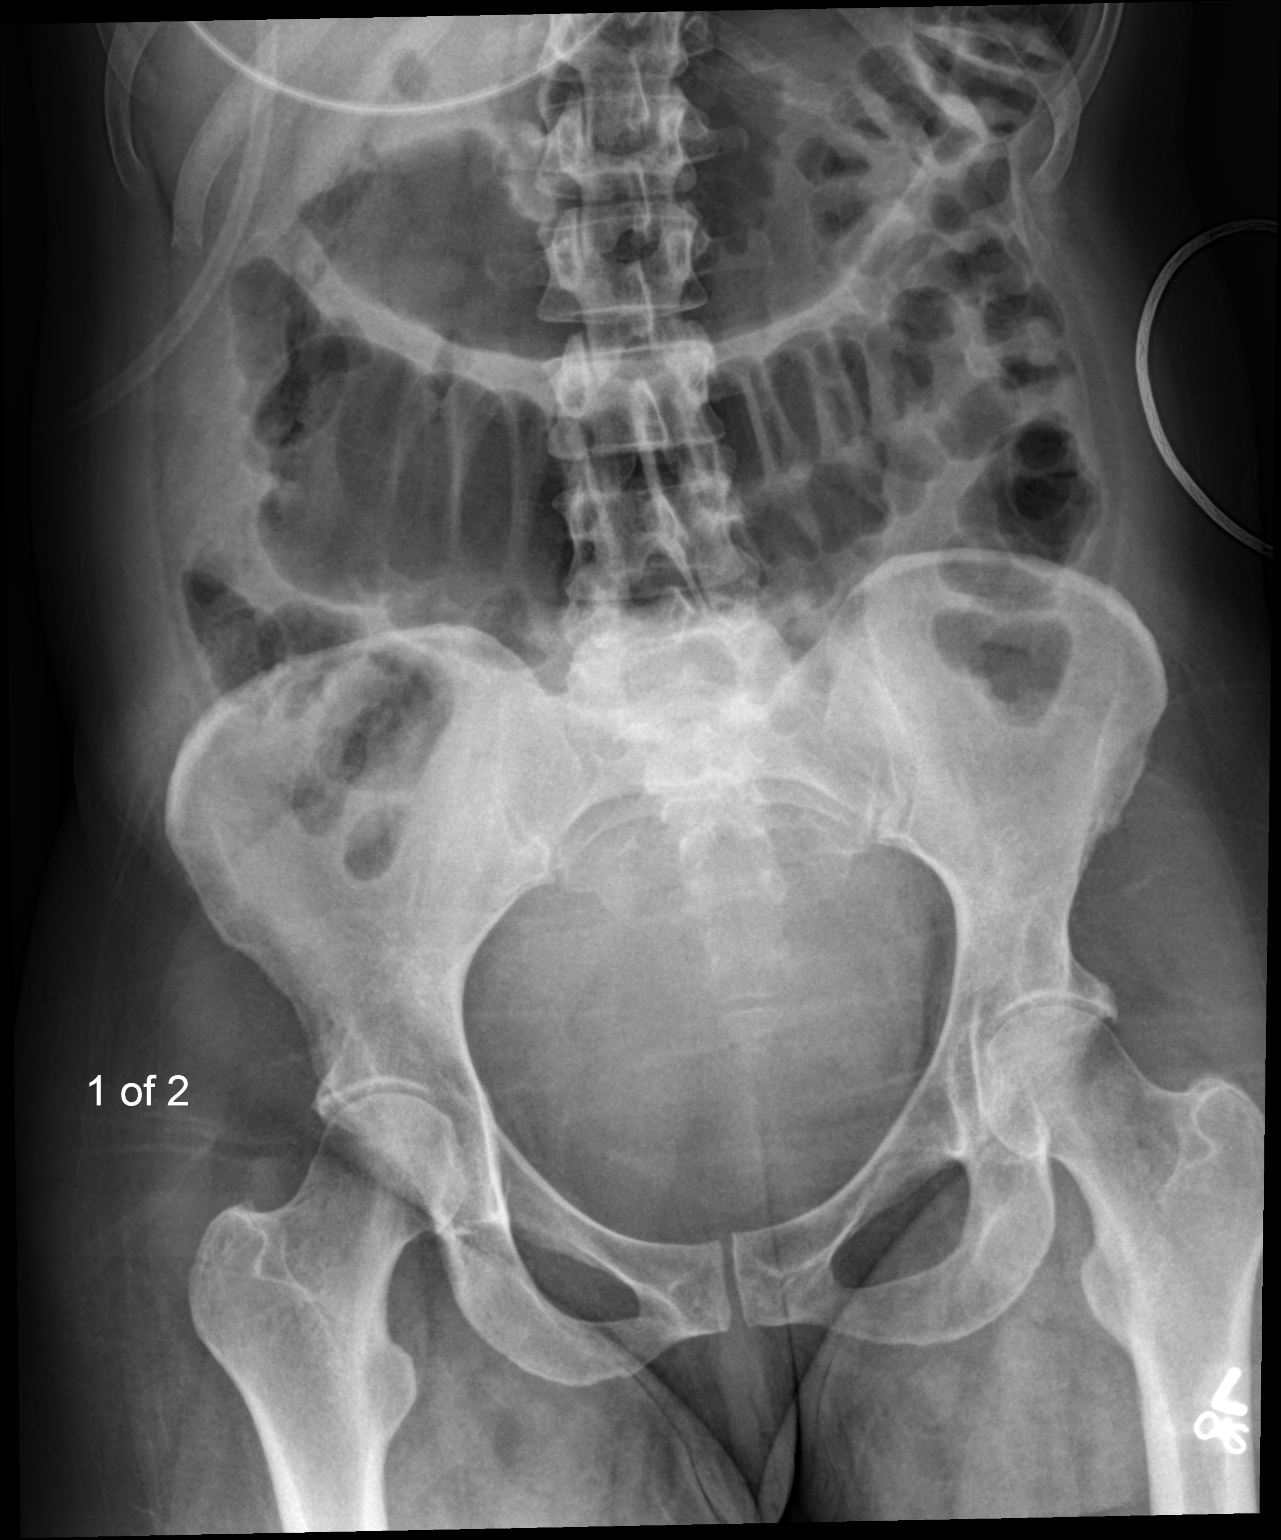
[im 2/2]
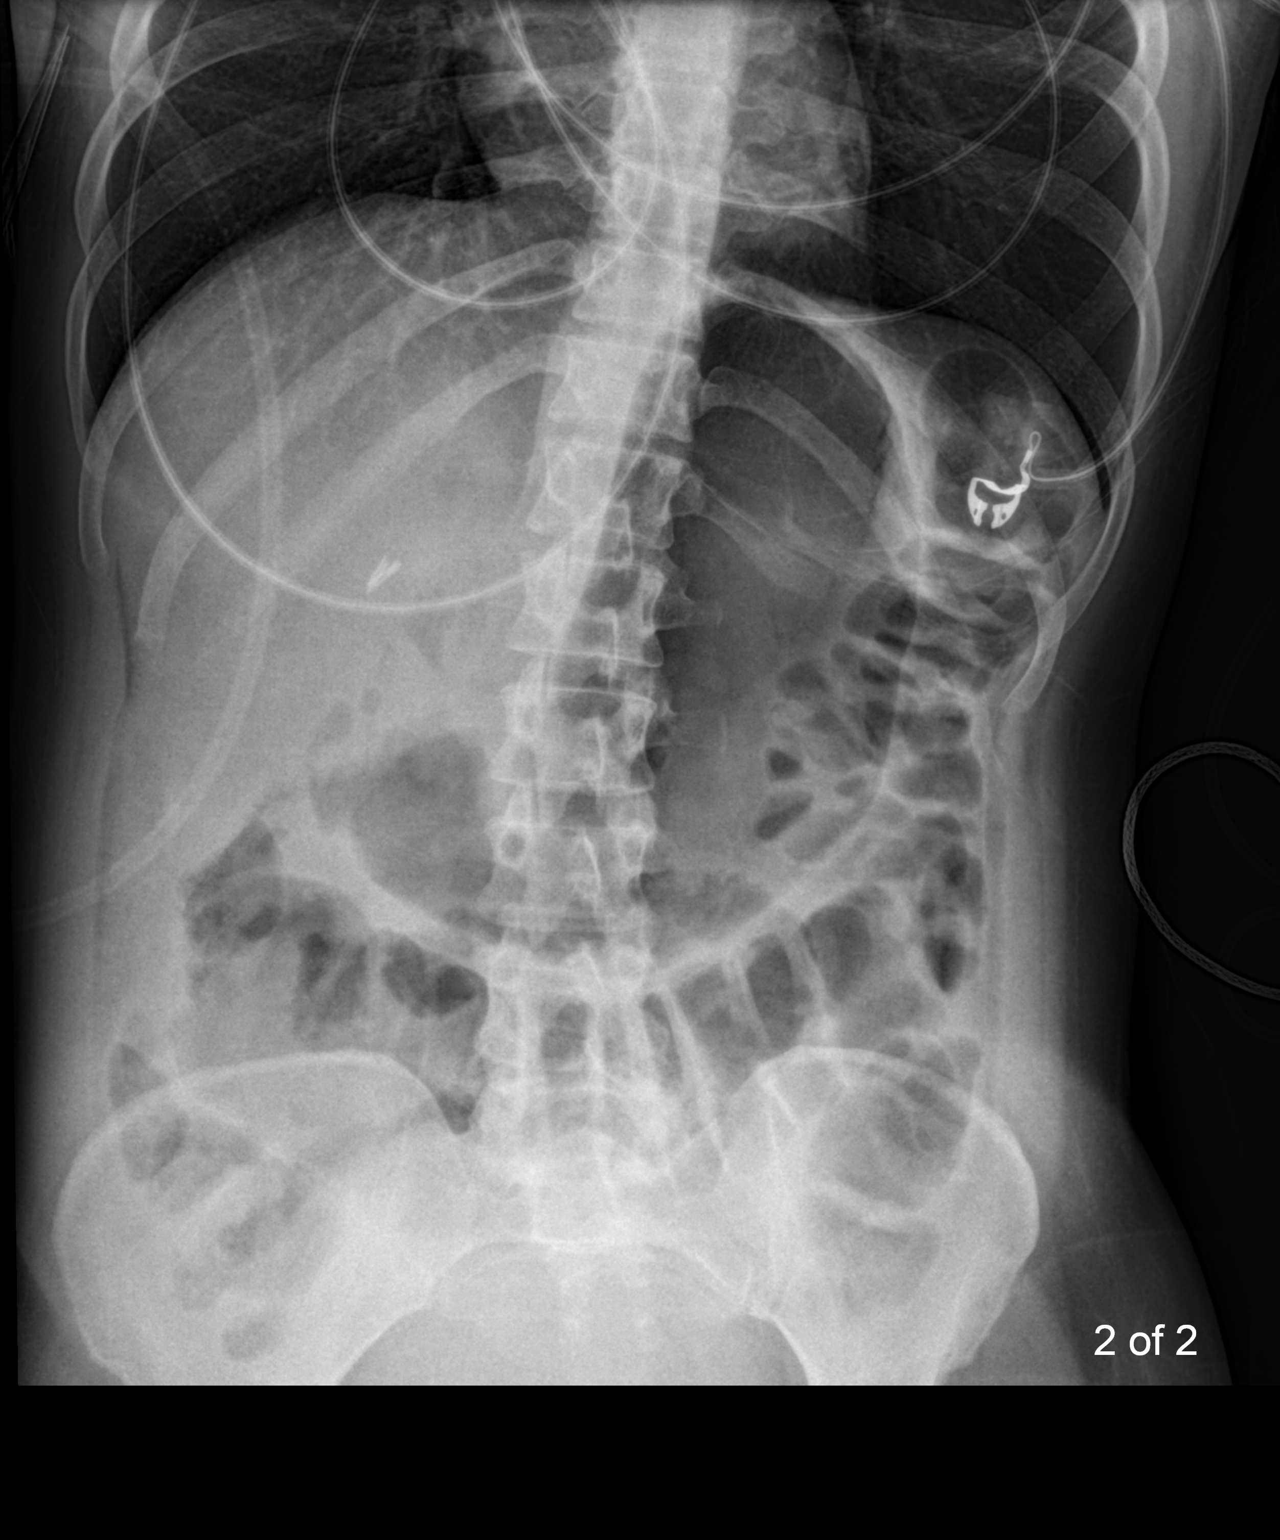

[2 of 2 positions shown; findings below may reference images not displayed]

FINDINGS: Mild gaseous distension of the stomach and transverse
colon.  Gas is also noted in the ascending, and descending colons.
Lung bases are clear.  Surgical clips in the right upper quadrant
suggest prior cholecystectomy.  No acute osseous abnormality.
IMPRESSION: Nonspecific but nonobstructed bowel gas pattern with gaseous
distension of the stomach and colon to the level of the distal
descending colon without significant dilation.

## 2013-04-11 IMAGING — CR DG CHEST 2V
2 series · 2 of 2 positions shown · non-contrast
Comparison: 03/29/2010

CLINICAL DATA: Shortness of breath.  Nausea and vomiting.

CHEST - 2 VIEW

[w chest pa]
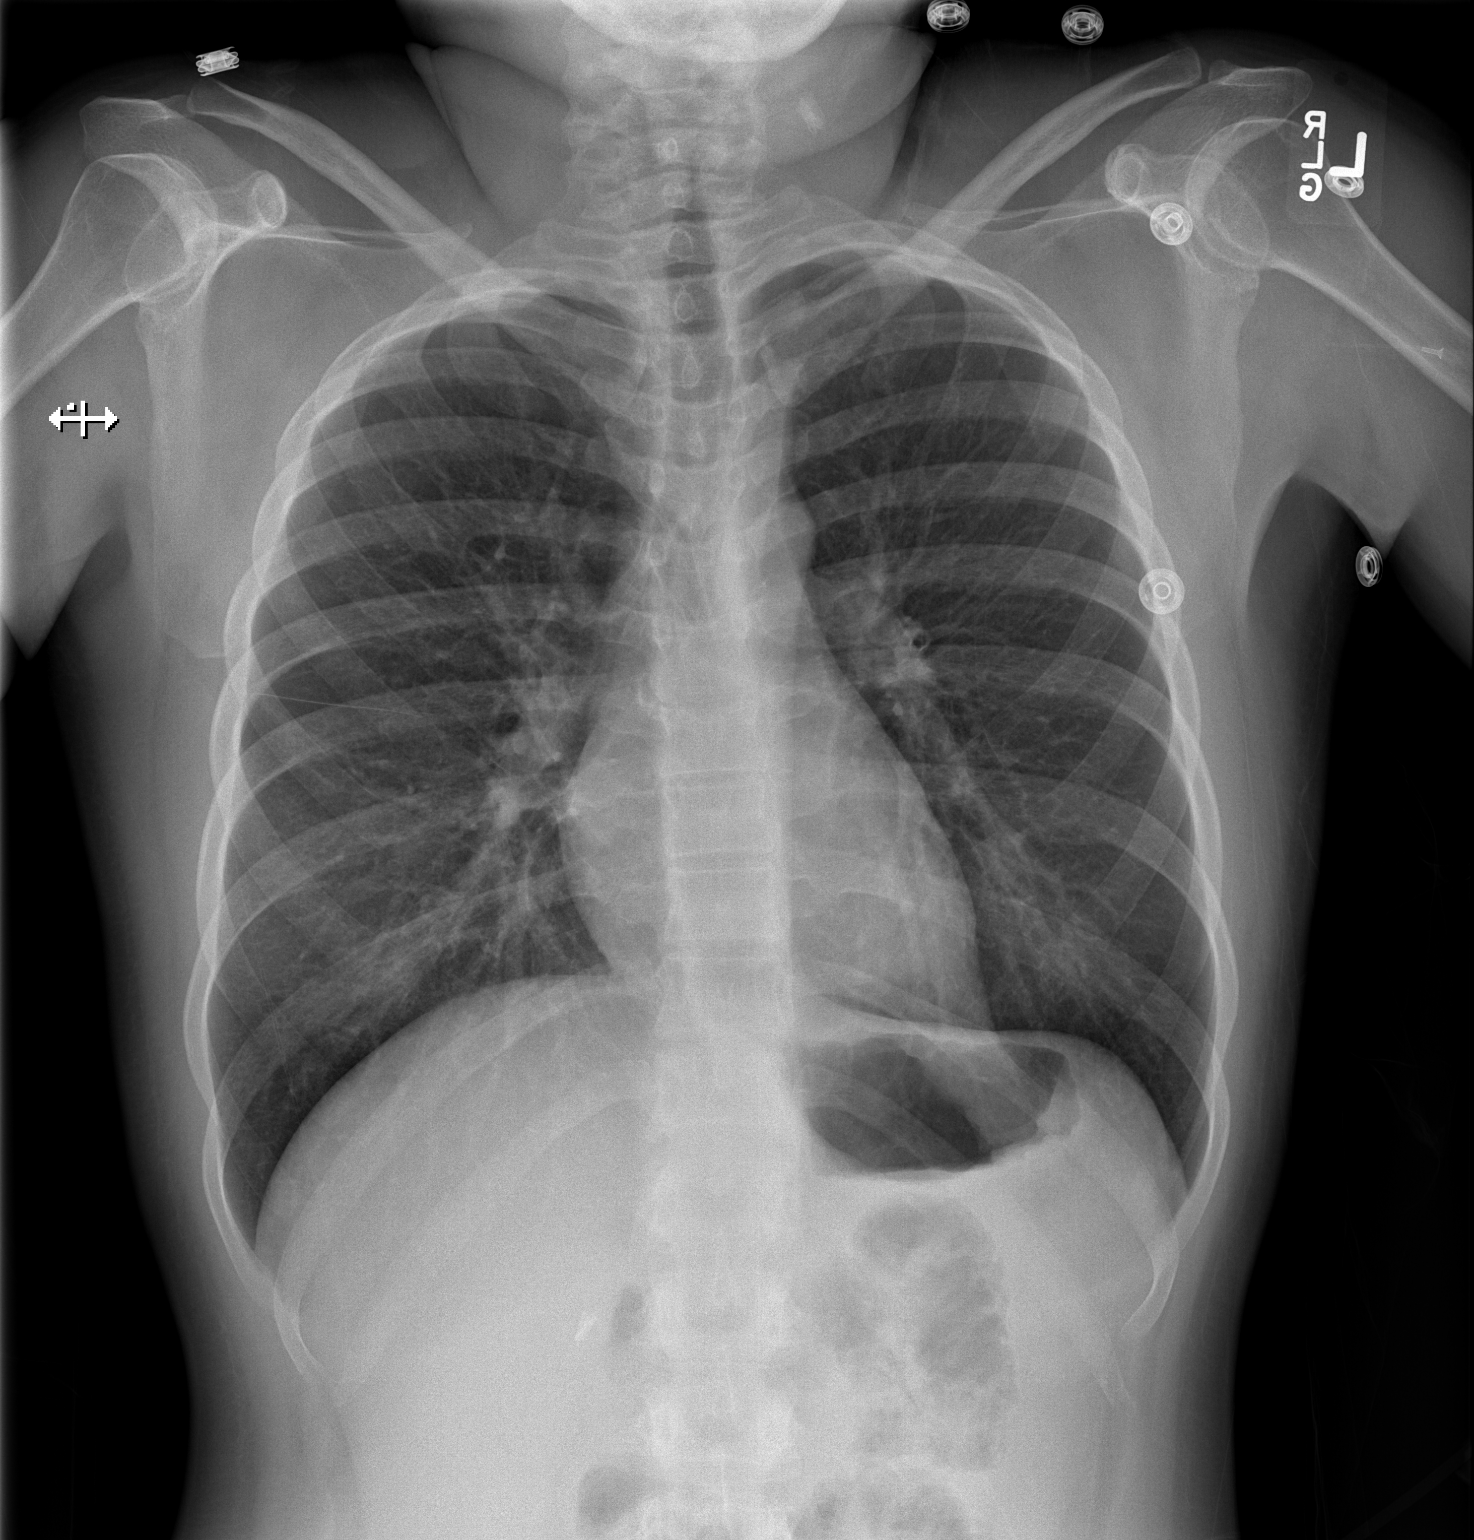

[w chest lat]
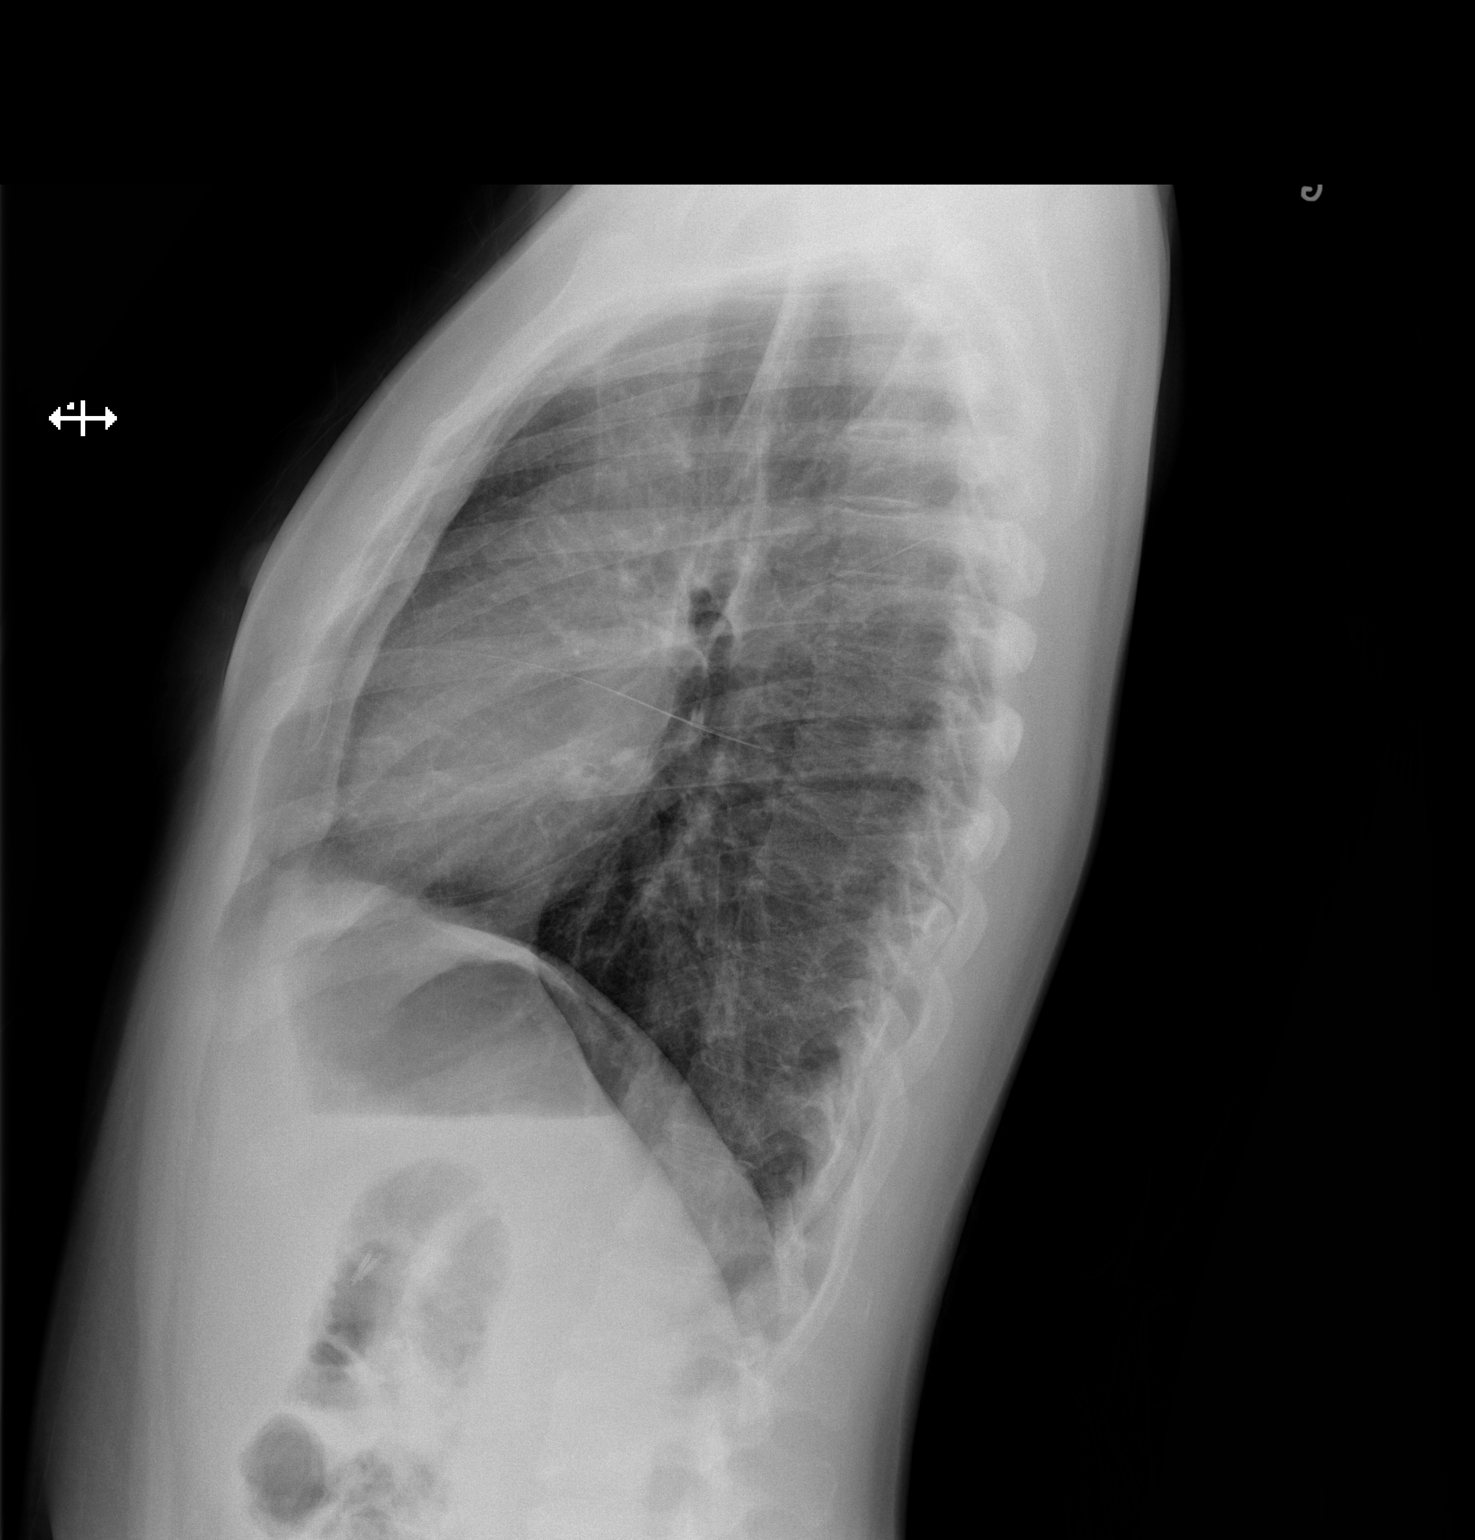

[2 of 2 positions shown; findings below may reference images not displayed]

FINDINGS: There is peribronchial thickening consistent with
bronchitis.  There are no infiltrates or effusions.  Heart size and
vascularity are normal.  No significant osseous abnormality.
IMPRESSION: Bronchitic changes.

## 2013-04-11 IMAGING — CR DG CERVICAL SPINE 2 OR 3 VIEWS
1 series · 3 of 3 positions shown · non-contrast
Comparison: None.

CLINICAL DATA: Neck stiffness, mass

CERVICAL SPINE - 2-3 VIEW

[Series 1: AP · U · 3 of 3 slices shown]
[im 1/3]
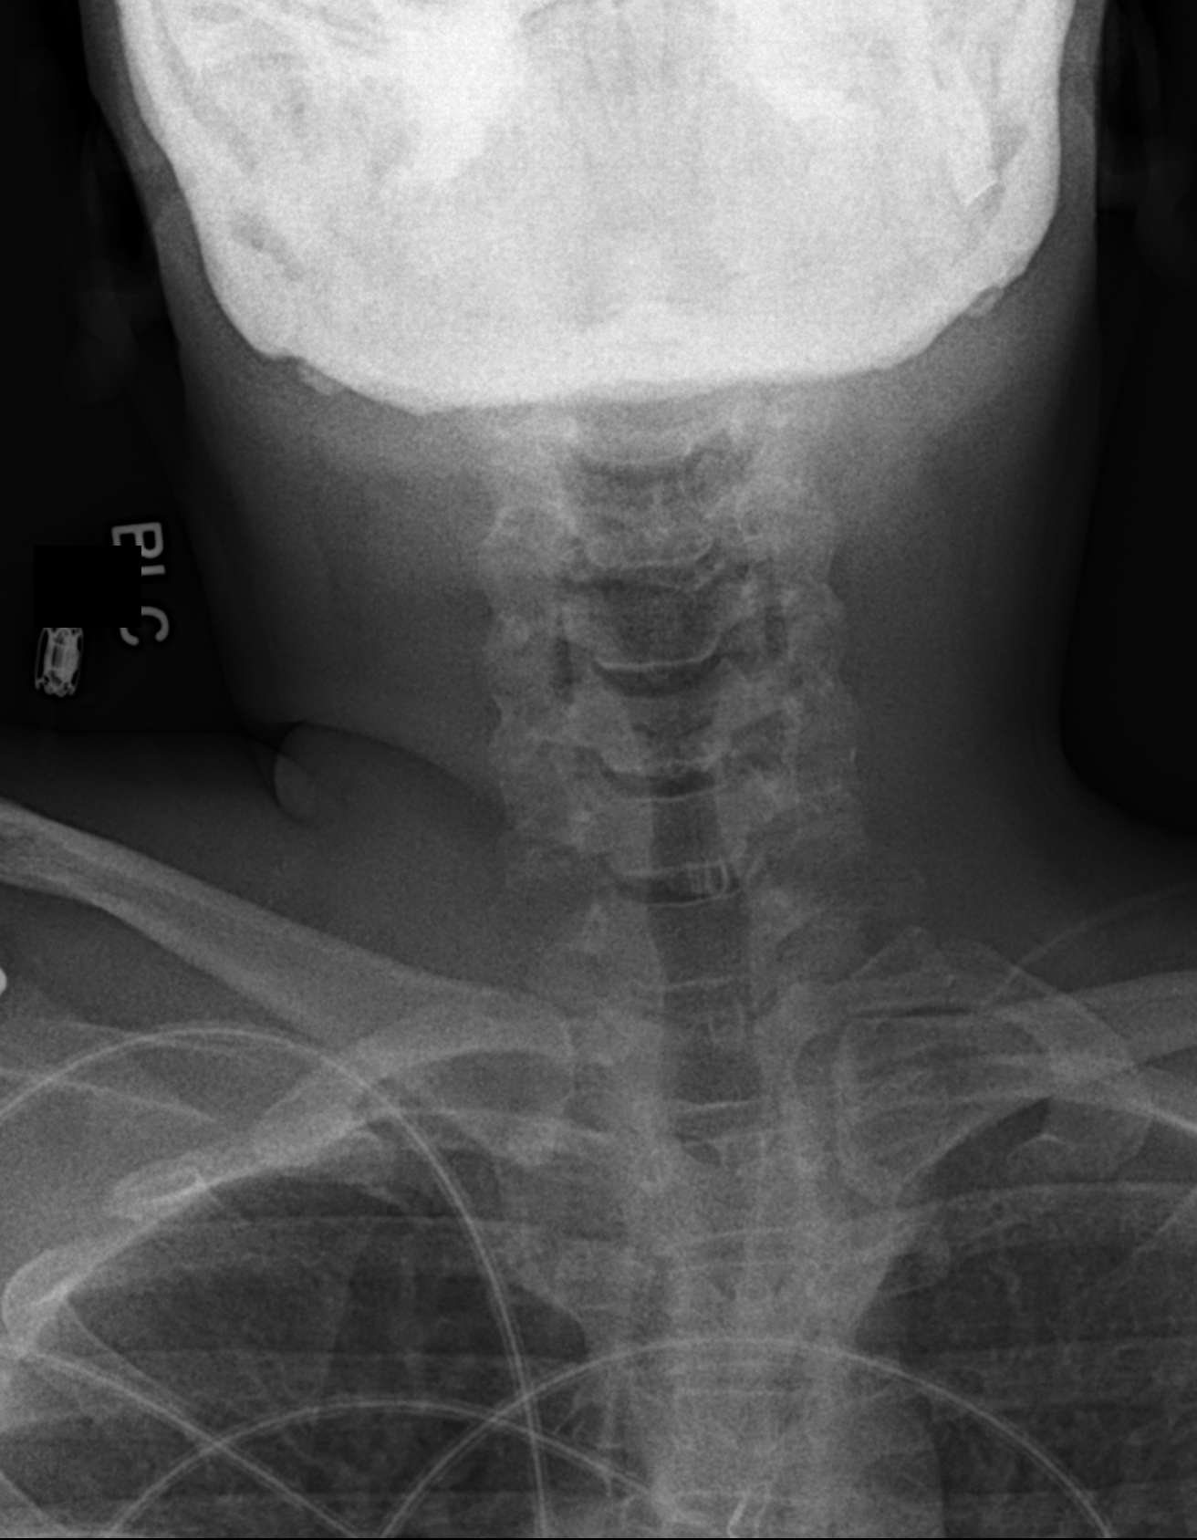
[im 2/3]
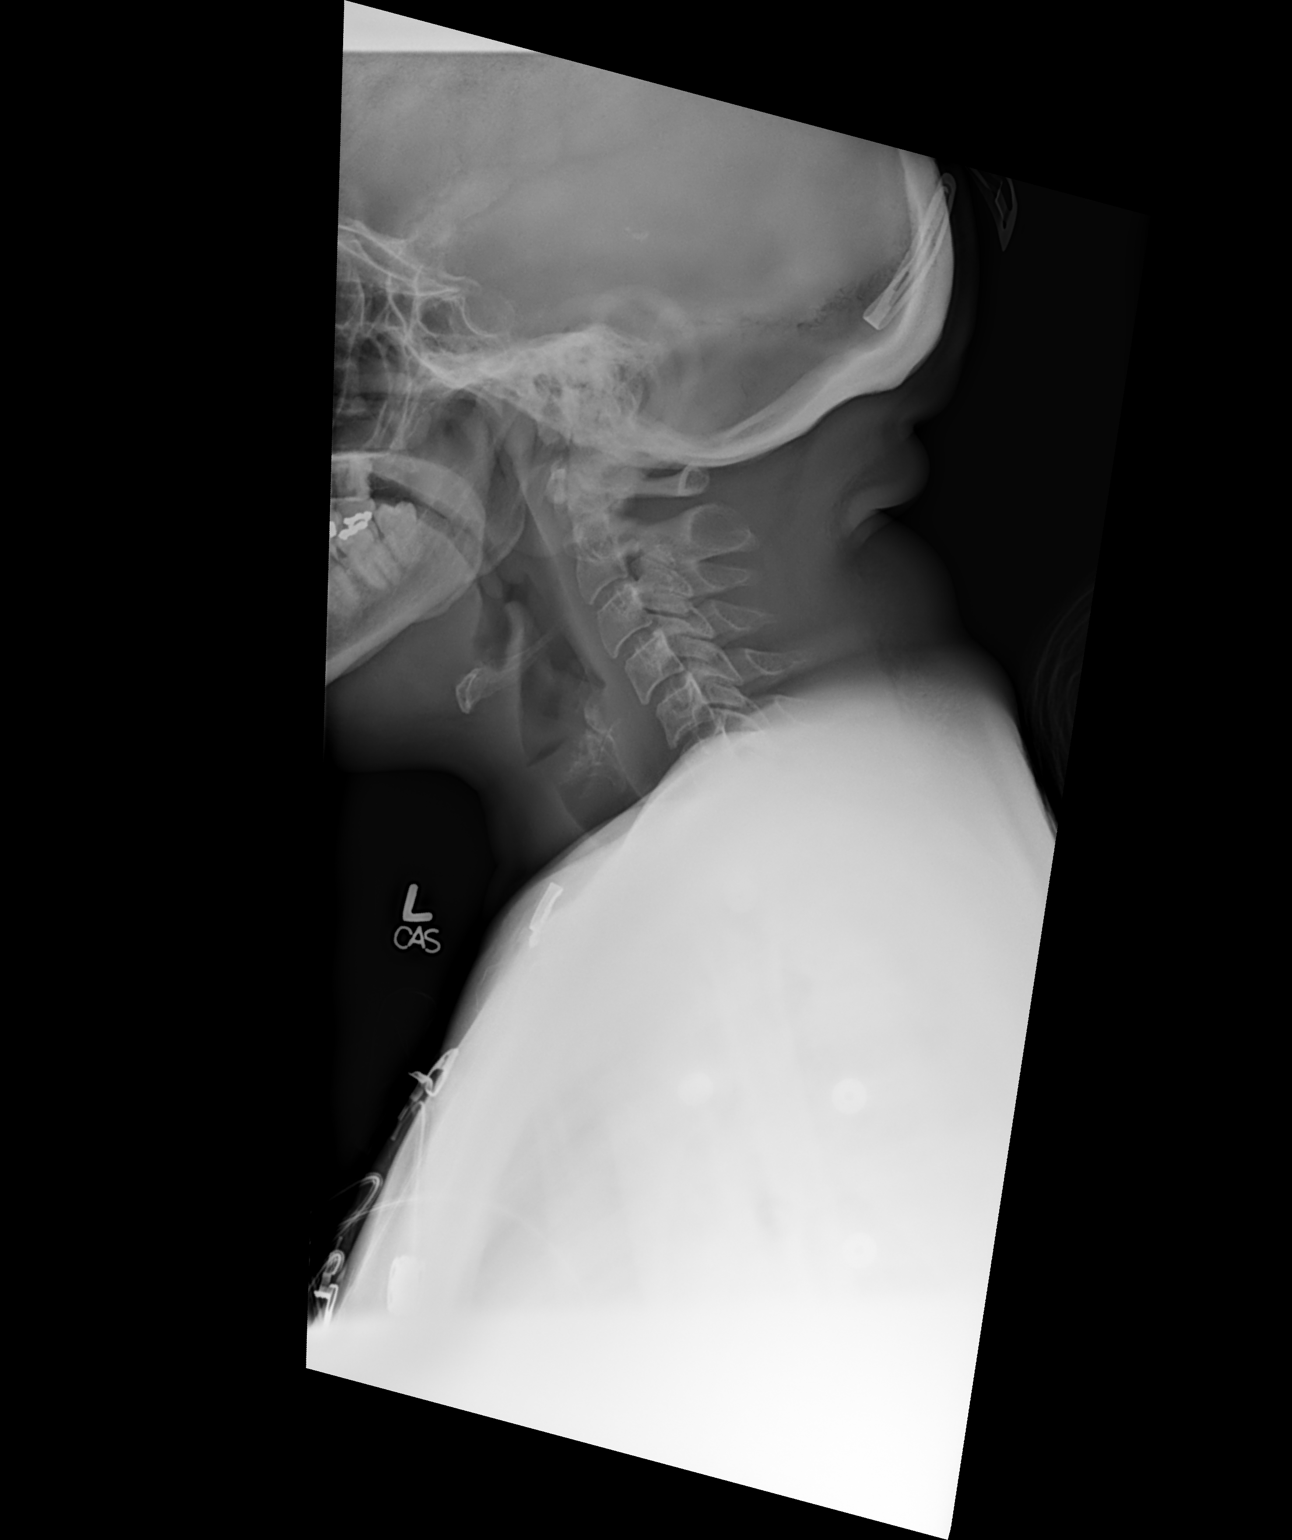
[im 3/3]
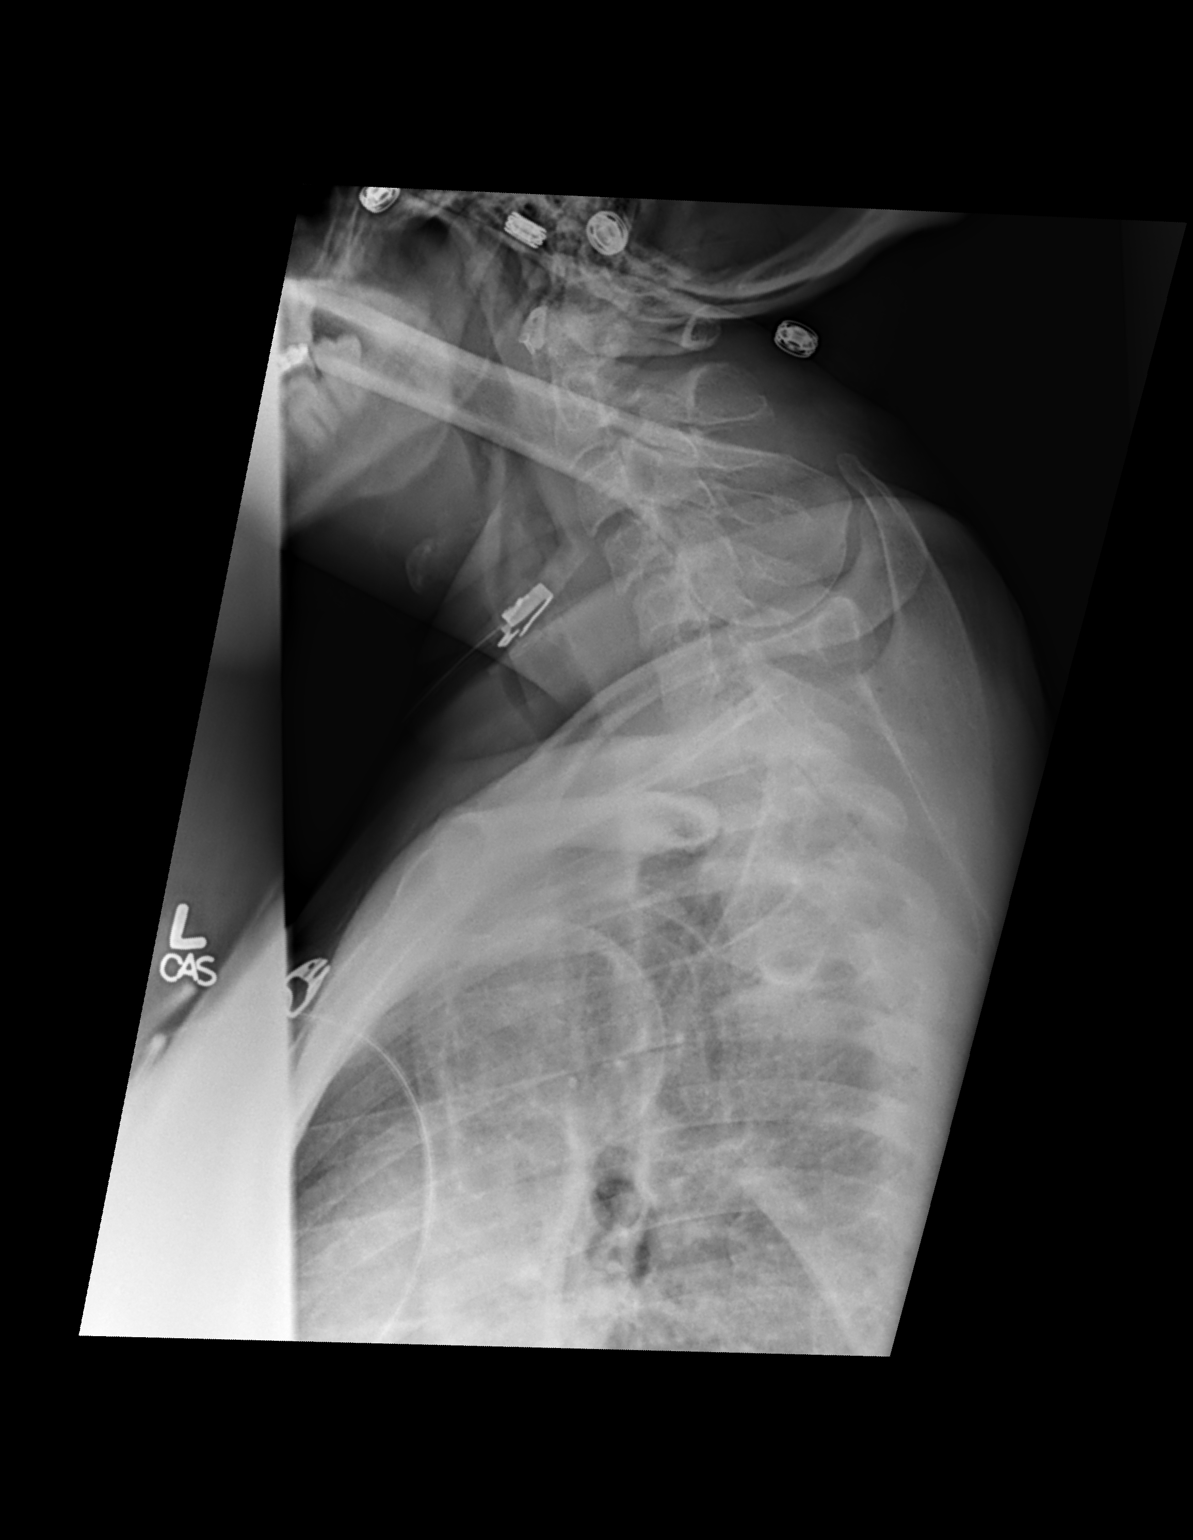

[3 of 3 positions shown; findings below may reference images not displayed]

FINDINGS: The cervical spine as well visualized to the
cervicothoracic junction on the swimmer's lateral view.  No
evidence of acute fracture or malalignment.  Mild reversal of the
typical cervical lordosis.  Mild degenerative change with
uncovertebral joint hypertrophy at C4-C5, and C5-C6.  No
prevertebral soft tissue swelling.
IMPRESSION: 1.  No acute fracture or malalignment.
2.  Mild reversal of the normal cervical lordosis
3.  Cervical spondylitic changes at C4-C5 and C5-C6.

## 2013-05-24 IMAGING — CR DG ABDOMEN 2V
2 series · 2 of 2 positions shown · non-contrast
Comparison: 06/25/2012

CLINICAL DATA: Abdominal pain and vomiting

ABDOMEN - 2 VIEW

[w abdomen upright]
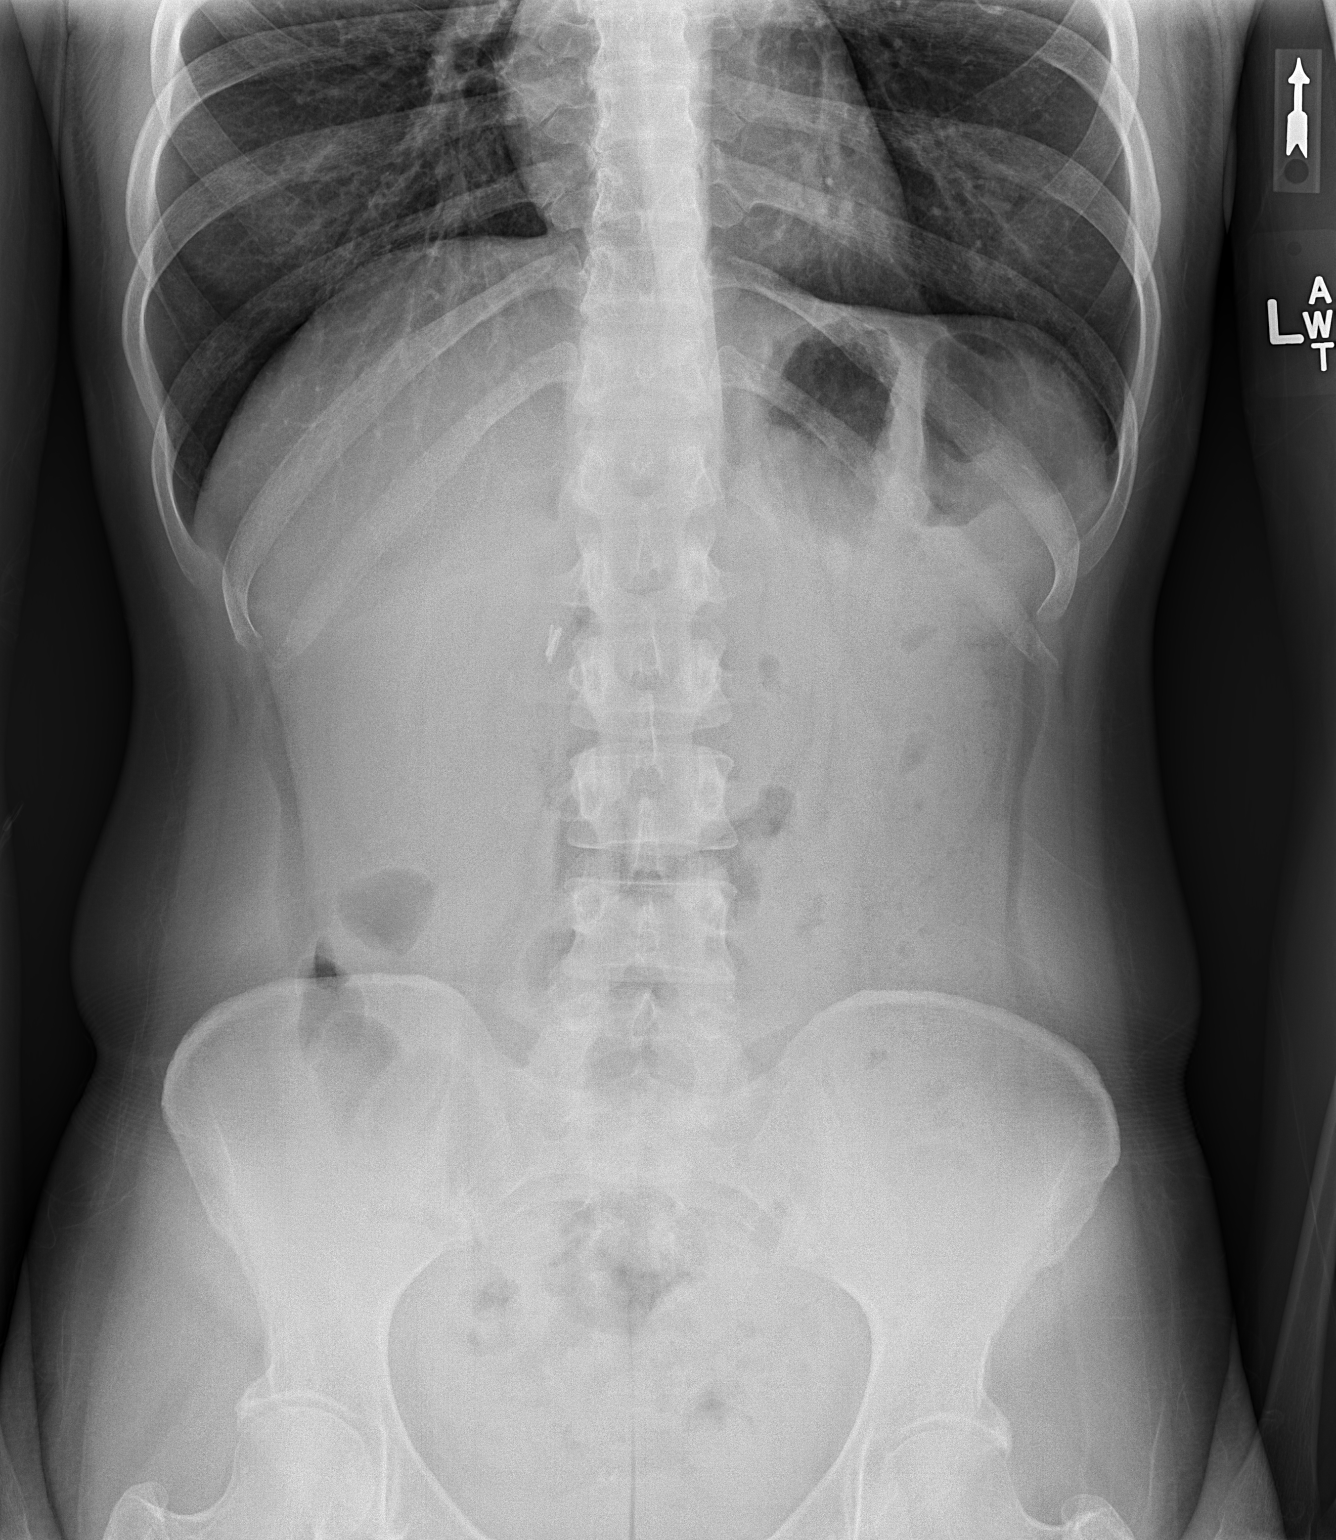

[t abdomen supine]
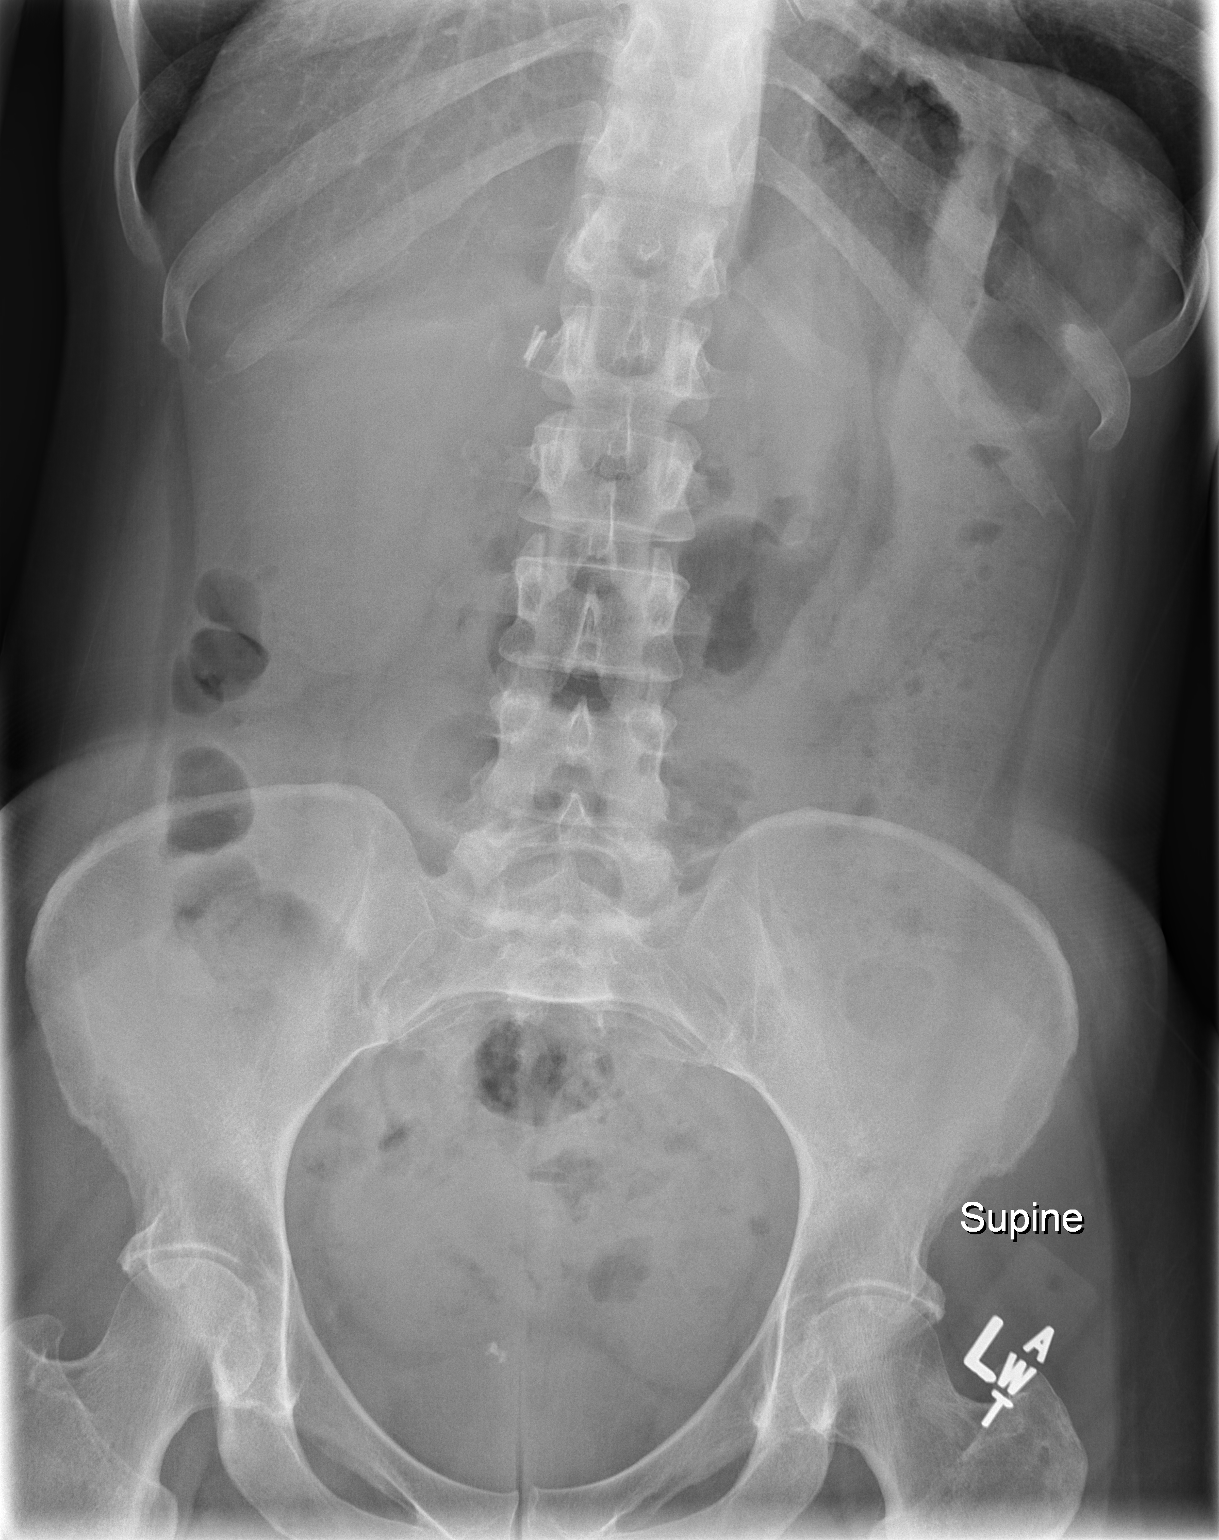

[2 of 2 positions shown; findings below may reference images not displayed]

FINDINGS: Cholecystectomy clips noted within the right upper
quadrant of the abdomen.  Gas and stool noted within the colon up
to the rectum.  No dilated loop of small bowel or fluid level
noted.
IMPRESSION: 1.  Nonobstructive bowel gas pattern.

## 2013-08-22 IMAGING — CR DG CHEST 1V PORT
1 series · 1 of 1 positions shown · non-contrast
Comparison: 06/25/2012.

CLINICAL DATA: Evaluate for infiltrates.

PORTABLE CHEST - 1 VIEW

[AP]
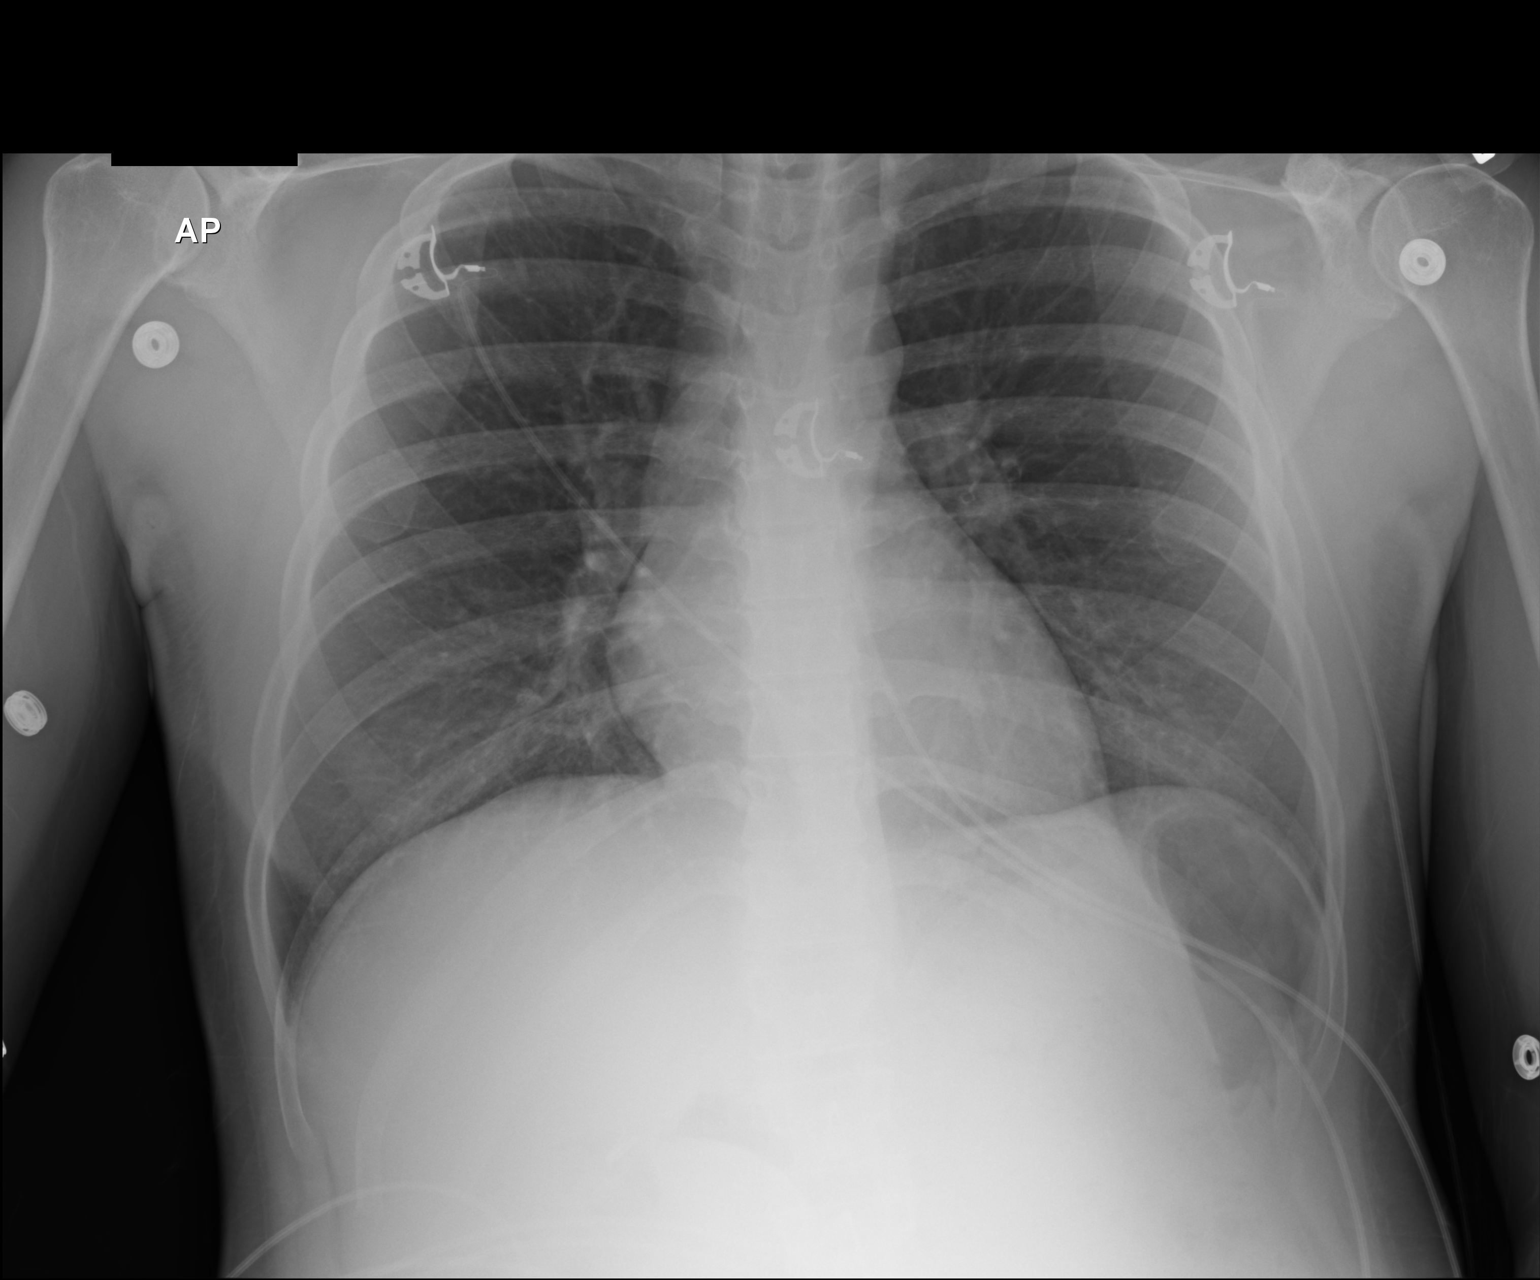

[1 of 1 positions shown; findings below may reference images not displayed]

FINDINGS: Trachea is midline.  Heart size normal.  Lungs are clear.
No pleural fluid.
IMPRESSION: No acute findings.

## 2013-09-11 IMAGING — CR DG ABDOMEN ACUTE W/ 1V CHEST
3 series · 3 of 3 positions shown · non-contrast
Comparison: Chest x-ray dated 11/05/2012 and abdominal radiograph
dated 08/07/2012

CLINICAL DATA: Nausea, vomiting, and diarrhea.

ACUTE ABDOMEN SERIES (ABDOMEN 2 VIEW & CHEST 1 VIEW)

[w abdomen decub]
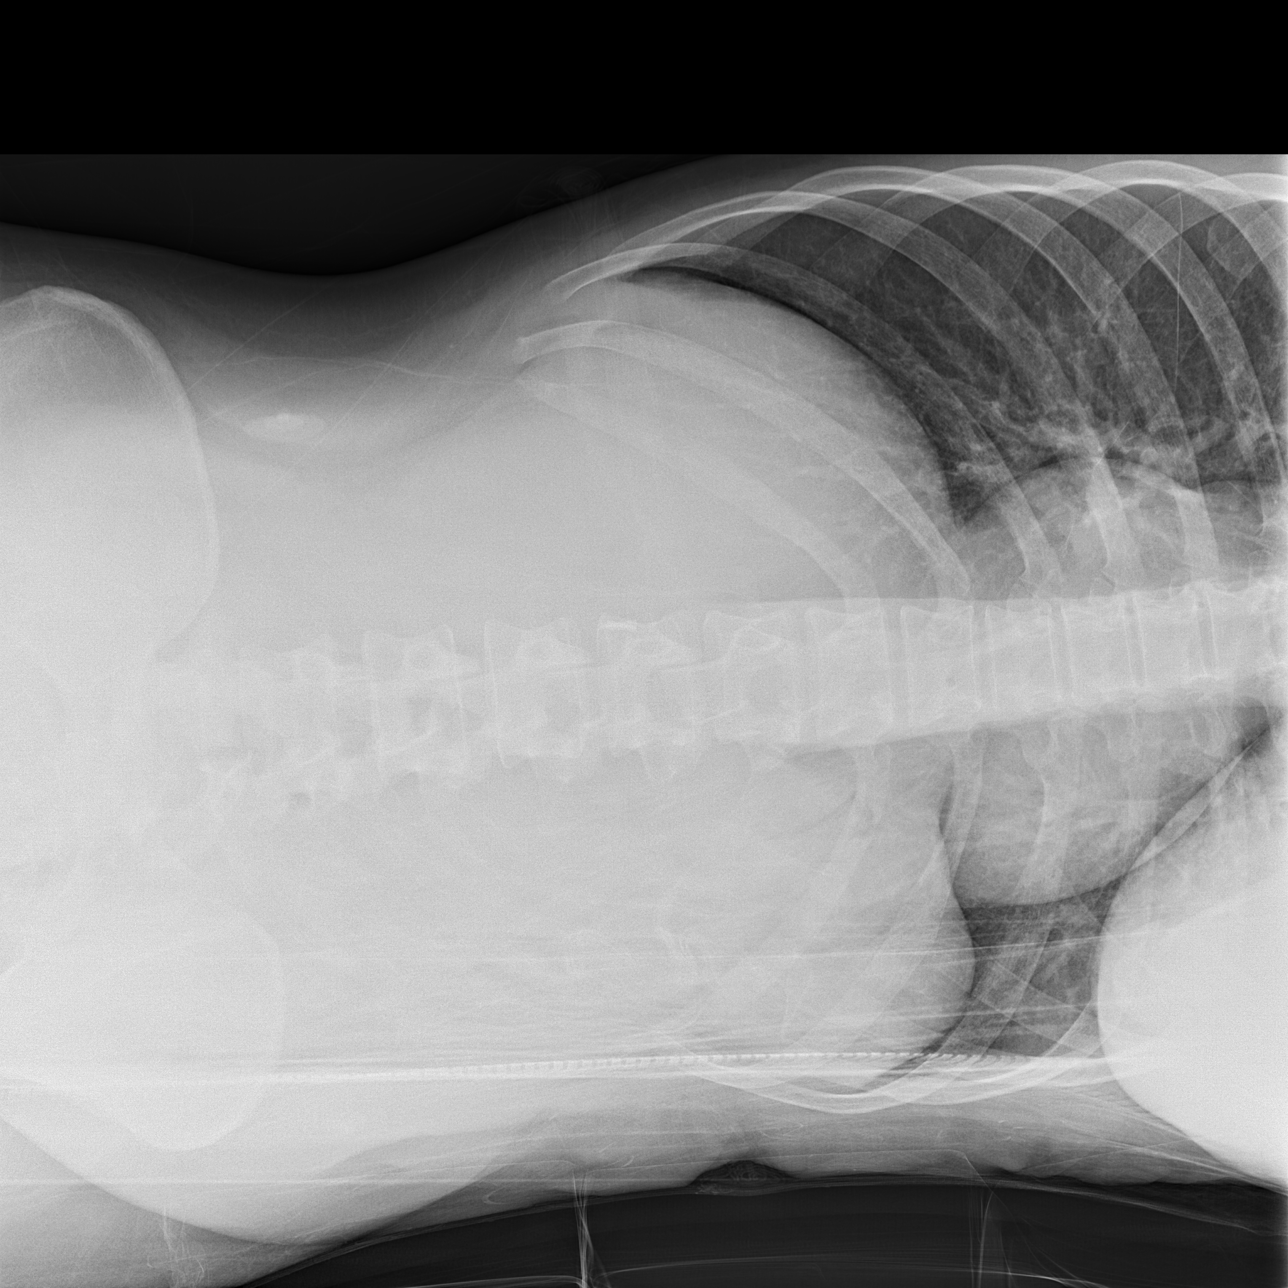

[x abdomen supine]
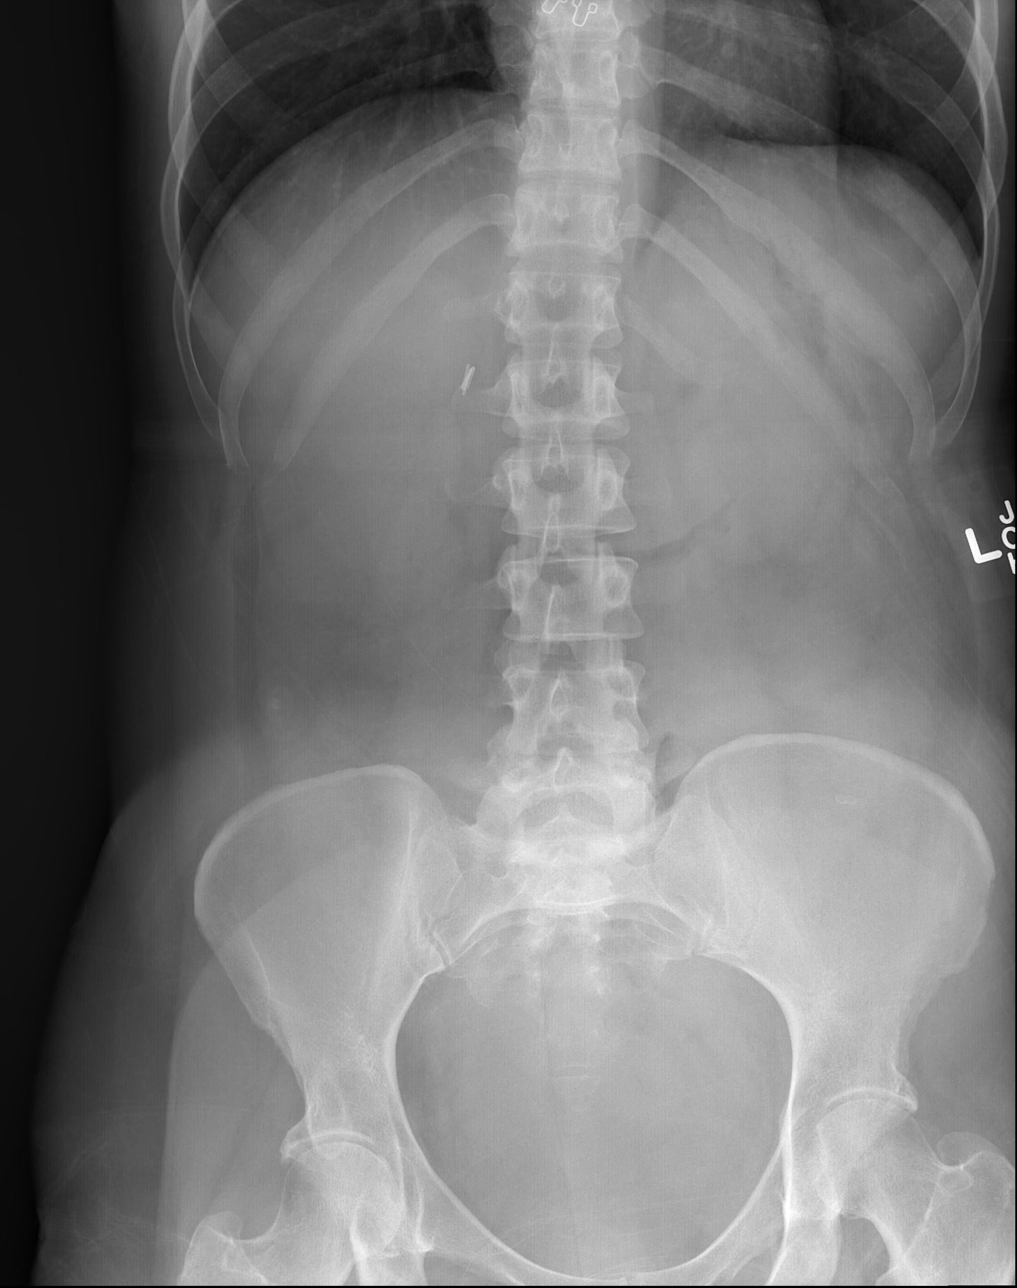

[x chest ap]
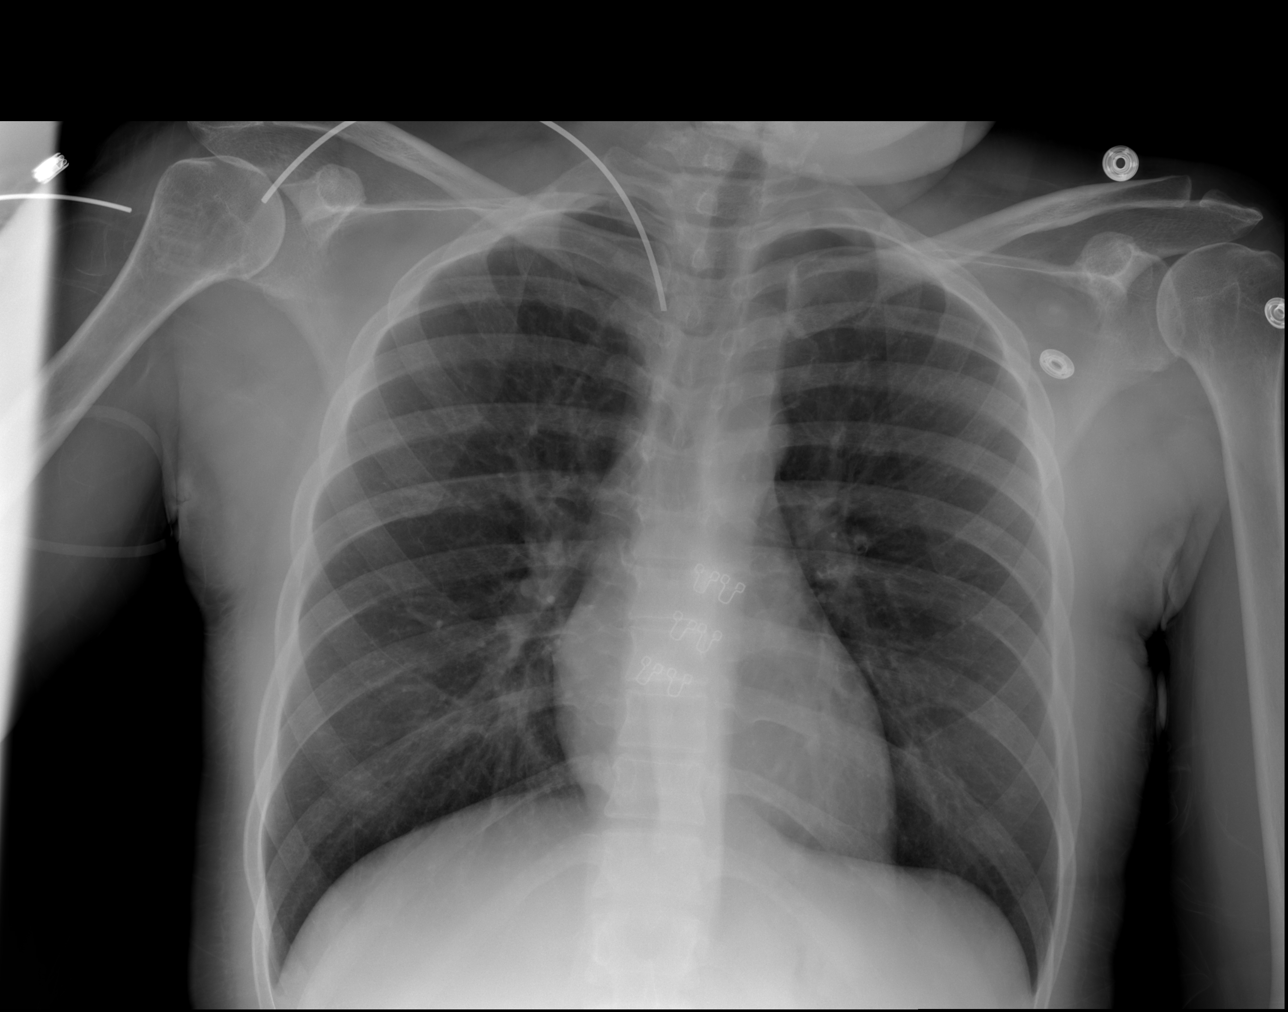

[3 of 3 positions shown; findings below may reference images not displayed]

FINDINGS: The heart and lungs are normal.  There is no free air in
the abdomen.  The bowel is almost completely devoid of bowel gas.
No appreciable dilated loops of large or small bowel.

No acute osseous abnormality. Previous cholecystectomy.
IMPRESSION: Benign-appearing abdomen and chest.

## 2013-09-11 IMAGING — CT CT ABD-PELV W/ CM
2 of 4 series · 17 of 46 positions shown, 19 images · IV contrast (OMNIPAQUE)
Comparison: 03/29/2010.

CLINICAL DATA: Abdominal pain.  Type 1 diabetic.  Nausea and
vomiting.

CT ABDOMEN AND PELVIS WITH CONTRAST
TECHNIQUE: Multidetector CT imaging of the abdomen and pelvis was
performed following the standard protocol during bolus
administration of intravenous contrast.
Contrast: 80mL OMNIPAQUE IOHEXOL 300 MG/ML  SOLN

[Series 2: rtn a/p with · axial · 0.70mm/px · z∈[+784,+1168]mm · 14 of 85 slices shown, 16 images]
[im 4/85  soft-tissue]
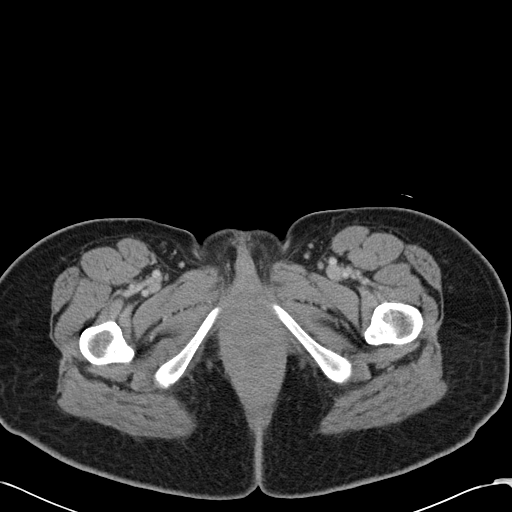
[im 4/85  bone]
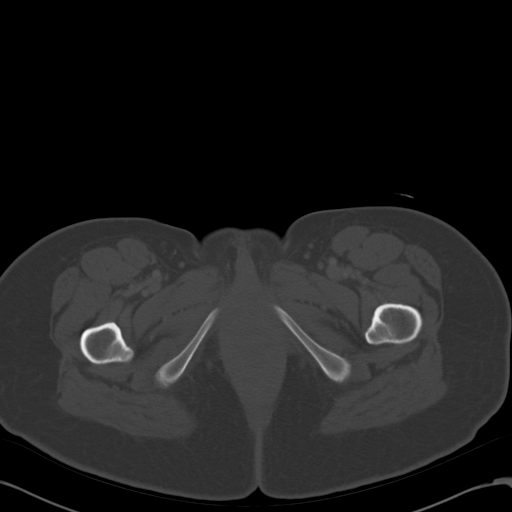
[im 11/85  soft-tissue]
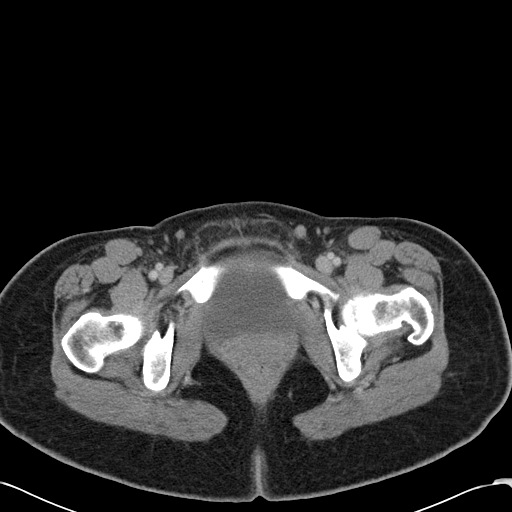
[im 18/85  soft-tissue]
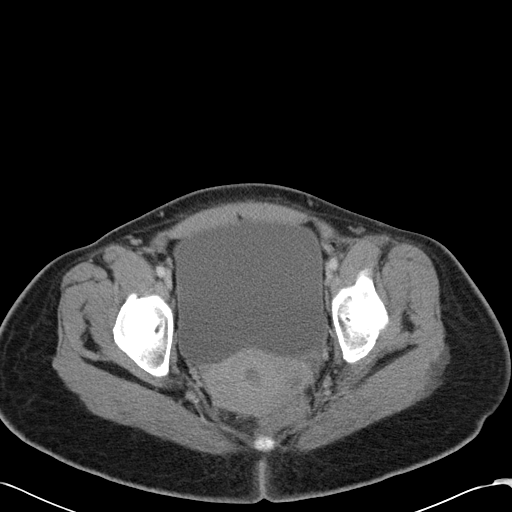
[im 22/85  soft-tissue]
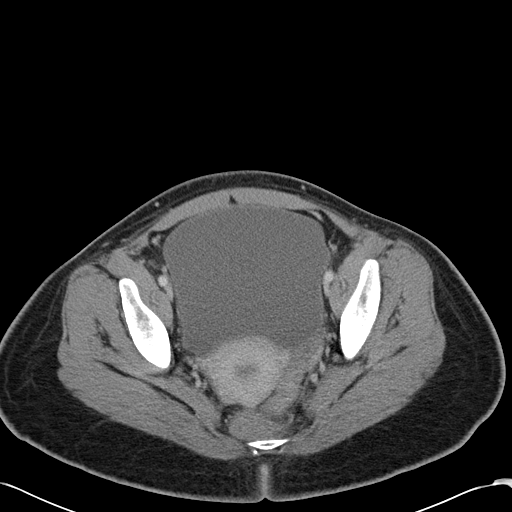
[im 29/85  soft-tissue]
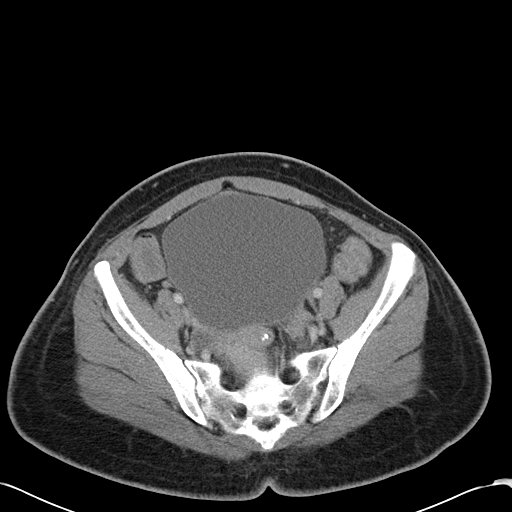
[im 36/85  soft-tissue]
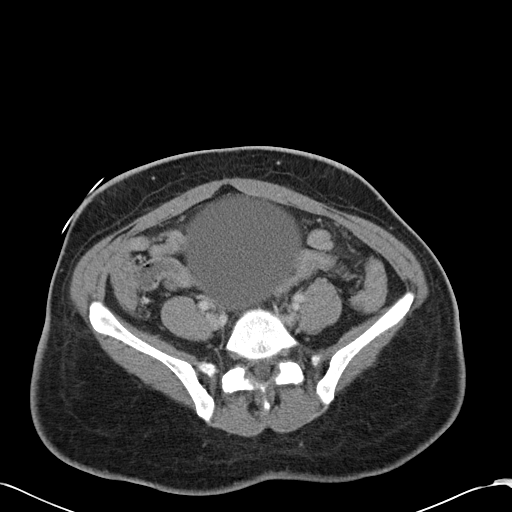
[im 39/85  soft-tissue]
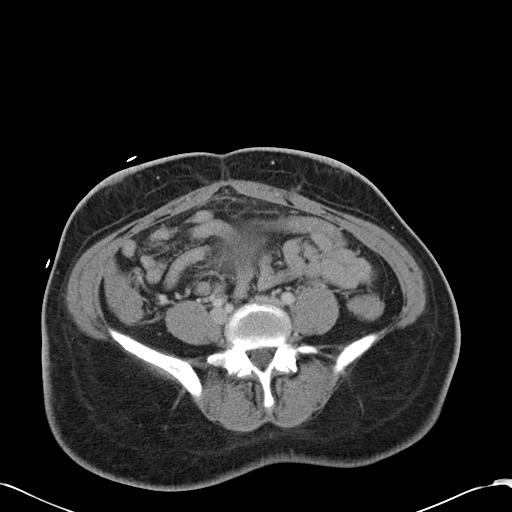
[im 46/85  soft-tissue]
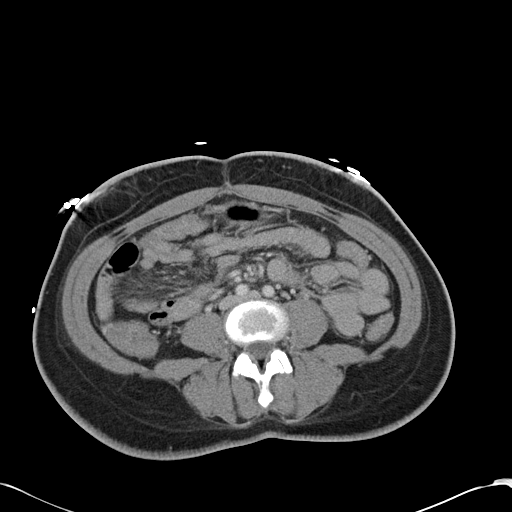
[im 50/85  soft-tissue]
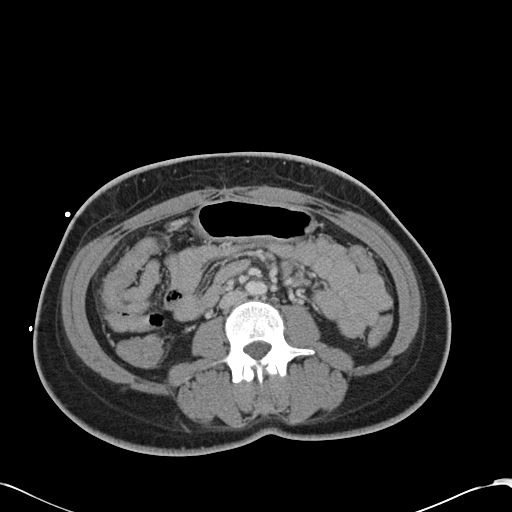
[im 50/85  bone]
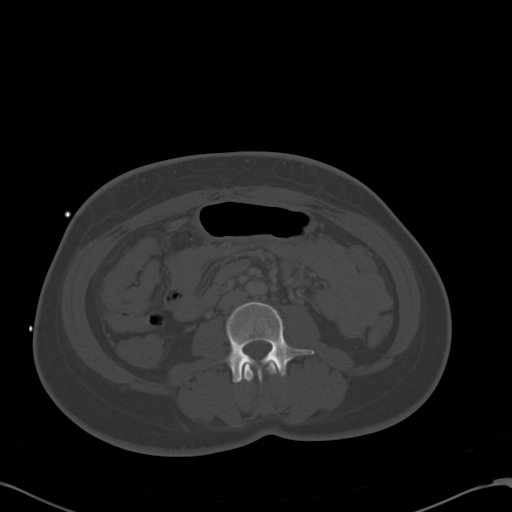
[im 57/85  soft-tissue]
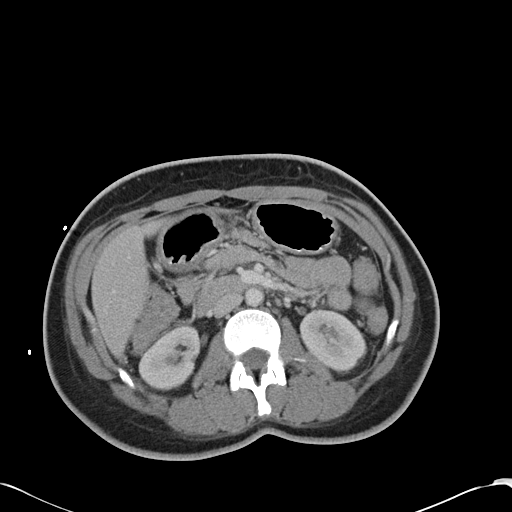
[im 64/85  soft-tissue]
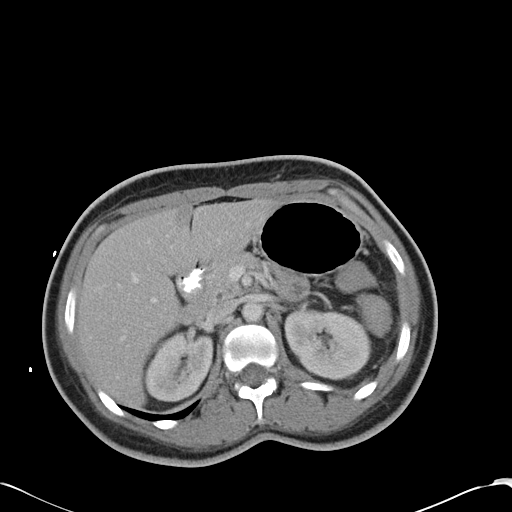
[im 67/85  soft-tissue]
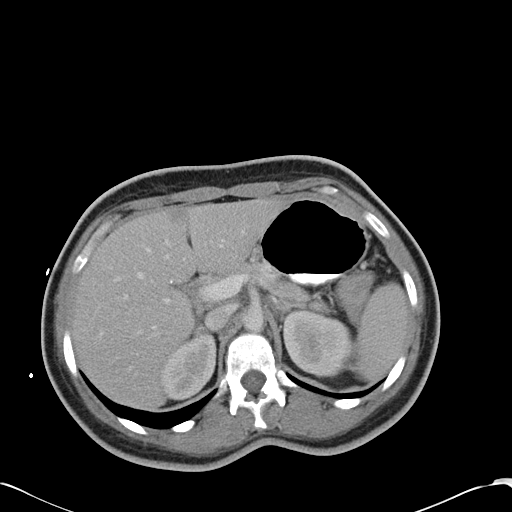
[im 74/85  soft-tissue]
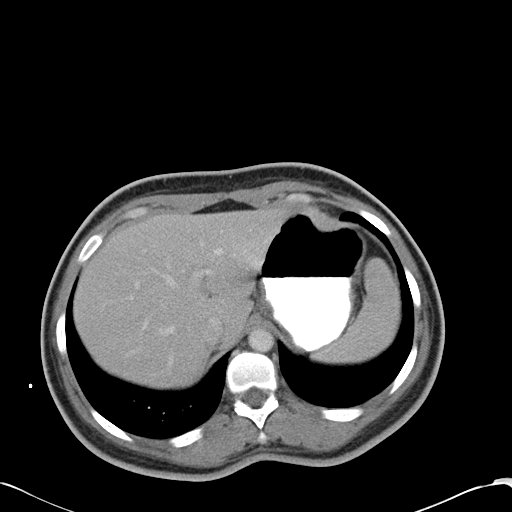
[im 81/85  soft-tissue]
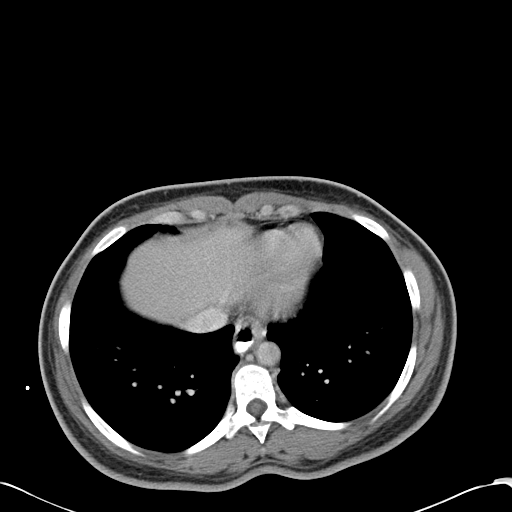

[Series 602: cor · coronal · 0.83mm/px · 3 of 69 slices shown]
[im 23/69  soft-tissue]
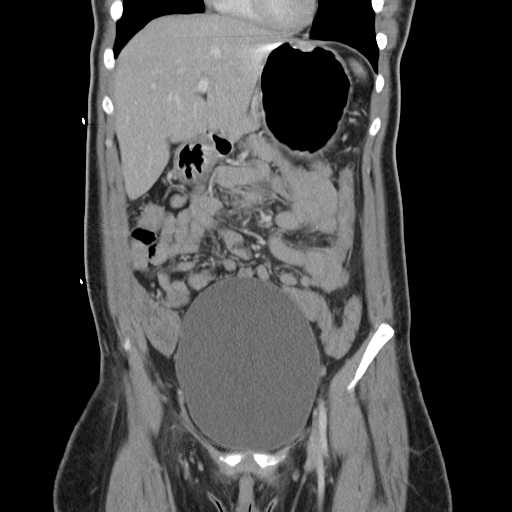
[im 31/69  soft-tissue]
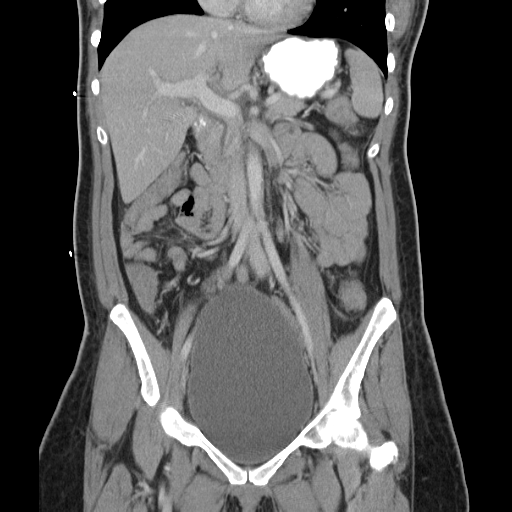
[im 38/69  soft-tissue]
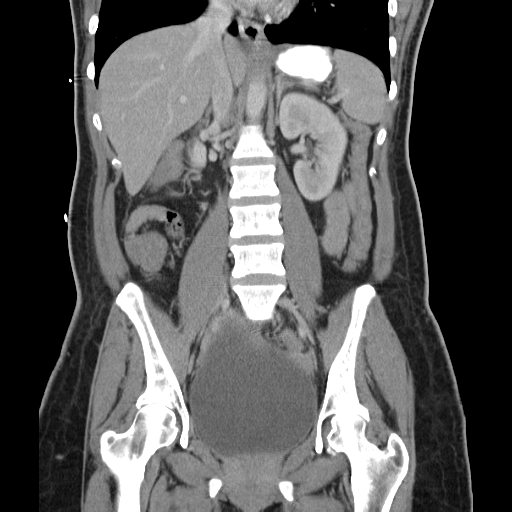

[17 of 46 positions shown; findings below may reference images not displayed]

FINDINGS: Lung Bases: Normal.

Liver:  Fatty infiltration adjacent to the falciform ligament.
Otherwise normal.

Spleen:  Normal.

Gallbladder:  Cholecystectomy with clips in the fossa.
Cholecystectomy with clips in the gallbladder fossa.

Common bile duct:  Normal.

Pancreas:  Normal.

Adrenal glands:  Normal.

Kidneys:  Normal enhancement.  Normal delayed excretion of
contrast.  Both ureters appear within normal limits.

Stomach:  Patulous gastroesophageal junction.

Small bowel:  Normal duodenum.  Small bowel is decompressed.  No
mesenteric adenopathy.  No inflammatory changes of small bowel.

Colon:   Normal appendix.  Majority of colon decompressed.  No
inflammatory changes.

Pelvic Genitourinary:  Marked distention of the urinary bladder,
with 16 cm cranial caudal dimension.  Although this is unusual,
this appears similar to the prior exam of 6699.  Question chronic
neurogenic bladder. Calcified and noncalcified uterine fibroids.
Ovaries appear within normal limits.  No definite free fluid.

Bones:  No aggressive osseous lesions.

Vasculature: Minimal atherosclerosis.  No acute abnormality.

Body Wall: Normal.
IMPRESSION: 1.  No definite acute intra-abdominal abnormality.
2.  Marked distention of the urinary bladder.  This was present on
the prior CT.  This raises possibility of neurogenic bladder.
3.  Fibroid uterus.
4.  Cholecystectomy.

## 2013-10-19 ENCOUNTER — Emergency Department (HOSPITAL_COMMUNITY)
Admission: EM | Admit: 2013-10-19 | Discharge: 2013-10-19 | Disposition: A | Discharge status: Home / Self Care | Payer: 59 | Attending: Emergency Medicine | Admitting: Emergency Medicine

## 2013-10-19 ENCOUNTER — Emergency Department (HOSPITAL_COMMUNITY): Payer: 59

## 2013-10-19 ENCOUNTER — Encounter (HOSPITAL_COMMUNITY): Payer: Self-pay | Admitting: Emergency Medicine

## 2013-10-19 DIAGNOSIS — J3489 Other specified disorders of nose and nasal sinuses: Secondary | ICD-10-CM | POA: Insufficient documentation

## 2013-10-19 DIAGNOSIS — R05 Cough: Secondary | ICD-10-CM | POA: Insufficient documentation

## 2013-10-19 DIAGNOSIS — R112 Nausea with vomiting, unspecified: Secondary | ICD-10-CM | POA: Insufficient documentation

## 2013-10-19 DIAGNOSIS — R197 Diarrhea, unspecified: Secondary | ICD-10-CM | POA: Insufficient documentation

## 2013-10-19 DIAGNOSIS — Z8719 Personal history of other diseases of the digestive system: Secondary | ICD-10-CM | POA: Insufficient documentation

## 2013-10-19 DIAGNOSIS — Z794 Long term (current) use of insulin: Secondary | ICD-10-CM | POA: Insufficient documentation

## 2013-10-19 DIAGNOSIS — F411 Generalized anxiety disorder: Secondary | ICD-10-CM | POA: Insufficient documentation

## 2013-10-19 DIAGNOSIS — IMO0001 Reserved for inherently not codable concepts without codable children: Secondary | ICD-10-CM | POA: Insufficient documentation

## 2013-10-19 DIAGNOSIS — E1149 Type 2 diabetes mellitus with other diabetic neurological complication: Secondary | ICD-10-CM | POA: Insufficient documentation

## 2013-10-19 DIAGNOSIS — I10 Essential (primary) hypertension: Secondary | ICD-10-CM | POA: Insufficient documentation

## 2013-10-19 DIAGNOSIS — J45909 Unspecified asthma, uncomplicated: Secondary | ICD-10-CM | POA: Insufficient documentation

## 2013-10-19 DIAGNOSIS — E1142 Type 2 diabetes mellitus with diabetic polyneuropathy: Secondary | ICD-10-CM | POA: Insufficient documentation

## 2013-10-19 DIAGNOSIS — Z79899 Other long term (current) drug therapy: Secondary | ICD-10-CM | POA: Insufficient documentation

## 2013-10-19 DIAGNOSIS — Z792 Long term (current) use of antibiotics: Secondary | ICD-10-CM | POA: Insufficient documentation

## 2013-10-19 DIAGNOSIS — R5383 Other fatigue: Secondary | ICD-10-CM

## 2013-10-19 DIAGNOSIS — R6883 Chills (without fever): Secondary | ICD-10-CM | POA: Insufficient documentation

## 2013-10-19 DIAGNOSIS — R5381 Other malaise: Secondary | ICD-10-CM | POA: Insufficient documentation

## 2013-10-19 DIAGNOSIS — R630 Anorexia: Secondary | ICD-10-CM | POA: Insufficient documentation

## 2013-10-19 DIAGNOSIS — Z7982 Long term (current) use of aspirin: Secondary | ICD-10-CM | POA: Insufficient documentation

## 2013-10-19 DIAGNOSIS — R059 Cough, unspecified: Secondary | ICD-10-CM | POA: Insufficient documentation

## 2013-10-19 DIAGNOSIS — D649 Anemia, unspecified: Secondary | ICD-10-CM | POA: Insufficient documentation

## 2013-10-19 LAB — CBC WITH DIFFERENTIAL/PLATELET
BASOS ABS: 0.1 10*3/uL (ref 0.0–0.1)
Basophils Relative: 1 % (ref 0–1)
Eosinophils Absolute: 0.2 10*3/uL (ref 0.0–0.7)
Eosinophils Relative: 2 % (ref 0–5)
HEMATOCRIT: 35.5 % — AB (ref 36.0–46.0)
HEMOGLOBIN: 11.4 g/dL — AB (ref 12.0–15.0)
LYMPHS PCT: 9 % — AB (ref 12–46)
Lymphs Abs: 0.8 10*3/uL (ref 0.7–4.0)
MCH: 25.4 pg — ABNORMAL LOW (ref 26.0–34.0)
MCHC: 32.1 g/dL (ref 30.0–36.0)
MCV: 79.1 fL (ref 78.0–100.0)
MONO ABS: 0.7 10*3/uL (ref 0.1–1.0)
Monocytes Relative: 8 % (ref 3–12)
NEUTROS ABS: 7.3 10*3/uL (ref 1.7–7.7)
Neutrophils Relative %: 80 % — ABNORMAL HIGH (ref 43–77)
Platelets: 359 10*3/uL (ref 150–400)
RBC: 4.49 MIL/uL (ref 3.87–5.11)
RDW: 14.3 % (ref 11.5–15.5)
WBC: 9.2 10*3/uL (ref 4.0–10.5)

## 2013-10-19 LAB — COMPREHENSIVE METABOLIC PANEL
ALK PHOS: 116 U/L (ref 39–117)
ALT: 13 U/L (ref 0–35)
AST: 18 U/L (ref 0–37)
Albumin: 3.5 g/dL (ref 3.5–5.2)
BILIRUBIN TOTAL: 0.5 mg/dL (ref 0.3–1.2)
BUN: 13 mg/dL (ref 6–23)
CHLORIDE: 100 meq/L (ref 96–112)
CO2: 18 meq/L — AB (ref 19–32)
CREATININE: 0.93 mg/dL (ref 0.50–1.10)
Calcium: 9.7 mg/dL (ref 8.4–10.5)
GFR calc Af Amer: 84 mL/min — ABNORMAL LOW (ref >90)
GFR, EST NON AFRICAN AMERICAN: 73 mL/min — AB (ref >90)
Glucose, Bld: 211 mg/dL — ABNORMAL HIGH (ref 70–99)
Potassium: 4.2 mEq/L (ref 3.7–5.3)
Sodium: 136 mEq/L — ABNORMAL LOW (ref 137–147)
Total Protein: 8 g/dL (ref 6.0–8.3)

## 2013-10-19 LAB — CBG MONITORING, ED: GLUCOSE-CAPILLARY: 275 mg/dL — AB (ref 70–99)

## 2013-10-19 LAB — URINALYSIS, ROUTINE W REFLEX MICROSCOPIC
Leukocytes, UA: NEGATIVE
Nitrite: NEGATIVE
PROTEIN: 100 mg/dL — AB
Specific Gravity, Urine: 1.03 (ref 1.005–1.030)
Urobilinogen, UA: 0.2 mg/dL (ref 0.0–1.0)
pH: 5.5 (ref 5.0–8.0)

## 2013-10-19 LAB — LIPASE, BLOOD: Lipase: 18 U/L (ref 11–59)

## 2013-10-19 LAB — URINE MICROSCOPIC-ADD ON

## 2013-10-19 MED ORDER — ONDANSETRON HCL 4 MG PO TABS: 4.0000 mg | ORAL_TABLET | Freq: Four times a day (QID) | ORAL | Status: DC

## 2013-10-19 MED ORDER — FENTANYL CITRATE 0.05 MG/ML IJ SOLN
50.0000 ug | Freq: Once | INTRAMUSCULAR | Status: AC
  Administered 2013-10-19: 50 ug via INTRAVENOUS
  Filled 2013-10-19: qty 2

## 2013-10-19 MED ORDER — PIPERACILLIN-TAZOBACTAM 3.375 G IVPB 30 MIN: 3.3750 g | Freq: Once | INTRAVENOUS | Status: DC

## 2013-10-19 MED ORDER — FENTANYL CITRATE 0.05 MG/ML IJ SOLN: 50.0000 ug | Freq: Once | INTRAMUSCULAR | Status: DC

## 2013-10-19 MED ORDER — ONDANSETRON HCL 4 MG/2ML IJ SOLN: 4.0000 mg | Freq: Once | INTRAMUSCULAR | Status: DC

## 2013-10-19 MED ORDER — VANCOMYCIN HCL IN DEXTROSE 1-5 GM/200ML-% IV SOLN: 1000.0000 mg | Freq: Once | INTRAVENOUS | Status: DC

## 2013-10-19 MED ORDER — METOCLOPRAMIDE HCL 5 MG/ML IJ SOLN
10.0000 mg | Freq: Once | INTRAMUSCULAR | Status: AC
  Administered 2013-10-19: 10 mg via INTRAVENOUS
  Filled 2013-10-19: qty 2

## 2013-10-19 MED ORDER — ONDANSETRON 4 MG PO TBDP
4.0000 mg | ORAL_TABLET | Freq: Once | ORAL | Status: AC
  Administered 2013-10-19: 4 mg via ORAL
  Filled 2013-10-19: qty 1

## 2013-10-19 MED ORDER — SODIUM CHLORIDE 0.9 % IV BOLUS (SEPSIS)
1000.0000 mL | Freq: Once | INTRAVENOUS | Status: AC
  Administered 2013-10-19: 1000 mL via INTRAVENOUS

## 2013-10-19 MED ORDER — IBUPROFEN 800 MG PO TABS: 800.0000 mg | ORAL_TABLET | Freq: Once | ORAL | Status: DC

## 2013-10-19 NOTE — Discharge Instructions (Signed)

## 2013-10-19 NOTE — ED Provider Notes (Signed)
CSN: [id number]     Arrival date & time 10/19/13  1236 History   First MD Initiated Contact with Patient 10/19/13 1343     Chief Complaint  Patient presents with  . Diarrhea  . Emesis     (Consider location/radiation/quality/duration/timing/severity/associated sxs/prior Treatment) Patient is a 48 y.o. female presenting with diarrhea and vomiting. The history is provided by the patient.  Diarrhea Associated symptoms: chills, myalgias and vomiting   Associated symptoms: no abdominal pain and no headaches   Emesis Associated symptoms: chills, diarrhea and myalgias   Associated symptoms: no abdominal pain and no headaches    patient has had nausea vomiting with some diarrhea. Also had head congestion and cough. States she was seen in urgent care and diagnosed with the flu so she was given antibiotics. She states she was given a seven-day course. She states she's had chills. She states she feels fatigue and aches all over. She states she thinks that being sick is affecting her diabetes and making her gastroparesis flareup. She thinks of throwing up could be from her gastroparesis. She has had some sick contacts with similar symptoms. No dysuria. She's had a cough with mild sputum production.  Past Medical History  Diagnosis Date  . Diabetes mellitus     diagnosed age 3  . Hypertension   . Cataract   . Anxiety   . Anemia   . GERD (gastroesophageal reflux disease)   . Constipation 04/03/2012  . Complication of anesthesia   . PONV (postoperative nausea and vomiting)   . Asthma     IN CHILDHOOD  . Neuromuscular disorder     Diabetic neuropathy  . Gastroparesis   . Diabetic neuropathy    Past Surgical History  Procedure Laterality Date  . Retinal detachment surgery    . Posterior vitrectomy and membrane peel-left eye  09/28/2002  . Posterior vitrectomy and membrane peel-right eye  03/16/2002  . Eye surgery    . Gailstones     Family History  Problem Relation Age of Onset  . Cystic  fibrosis Mother   . Hypertension Father   . Diabetes Brother   . Hypertension Maternal Grandmother    History  Substance Use Topics  . Smoking status: Never Smoker   . Smokeless tobacco: Never Used  . Alcohol Use: Yes     Comment: occas  wine   OB History   Grav Para Term Preterm Abortions TAB SAB Ect Mult Living                 Review of Systems  Constitutional: Positive for chills, appetite change and fatigue. Negative for activity change.  HENT: Positive for sinus pressure.   Eyes: Negative for pain.  Respiratory: Positive for cough. Negative for chest tightness and shortness of breath.   Cardiovascular: Negative for chest pain and leg swelling.  Gastrointestinal: Positive for nausea, vomiting and diarrhea. Negative for abdominal pain.  Genitourinary: Negative for flank pain.  Musculoskeletal: Positive for myalgias. Negative for back pain and neck stiffness.  Skin: Negative for rash.  Neurological: Negative for weakness, numbness and headaches.  Psychiatric/Behavioral: Negative for behavioral problems.      Allergies  Anesthetics, amide and Morphine and related  Home Medications   Prior to Admission medications   Medication Sig Start Date End Date Taking? Authorizing Provider  ALPRAZolam (XANAX) 0.25 MG tablet Take 0.25 mg by mouth 2 (two) times daily.   Yes Historical Provider, MD  aspirin EC 81 MG EC tablet Take 1 tablet (  81 mg total) by mouth daily. 11/07/12  Yes Charlynne Cousins, MD  azithromycin (ZITHROMAX Z-PAK) 250 MG tablet Take 250-500 mg by mouth See admin instructions. Takes 500mg  on day 1, then 250mg  qd for 4 days 10/18/13  Yes Historical Provider, MD  ferrous sulfate 325 (65 FE) MG tablet Take 325 mg by mouth 3 (three) times a week.    Yes Historical Provider, MD  furosemide (LASIX) 20 MG tablet Take 2 tablets (40 mg total) by mouth 2 (two) times daily. 11/07/12  Yes Charlynne Cousins, MD  insulin aspart (NOVOLOG) 100 UNIT/ML injection Inject 0-15 Units  into the skin 3 (three) times daily with meals. 11/28/12  Yes Nishant Dhungel, MD  insulin detemir (LEVEMIR) 100 UNIT/ML injection Inject 0.15 mLs (15 Units total) into the skin at bedtime. 02/17/13  Yes Robbie Lis, MD  lisinopril (PRINIVIL,ZESTRIL) 10 MG tablet Take 10 mg by mouth every morning.    Yes Historical Provider, MD  pregabalin (LYRICA) 150 MG capsule Take 1 capsule (150 mg total) by mouth 2 (two) times daily. 02/17/13  Yes Robbie Lis, MD  promethazine (PHENERGAN) 12.5 MG tablet Take 1 tablet (12.5 mg total) by mouth every 6 (six) hours as needed for nausea (for refractory anusea and/or vomiting). 02/17/13  Yes Robbie Lis, MD   BP 143/64  Pulse 106  Resp 18  SpO2 97%  LMP 10/19/2013 Physical Exam  Nursing note and vitals reviewed. Constitutional: She is oriented to person, place, and time. She appears well-developed and well-nourished.  Patient appears uncomfortable and has emesis bag by her  HENT:  Head: Normocephalic and atraumatic.  Mouth/Throat: No oropharyngeal exudate.  Neck: Normal range of motion. Neck supple.  Cardiovascular: Normal rate, regular rhythm and normal heart sounds.   No murmur heard. Pulmonary/Chest: Effort normal. No respiratory distress. She has no wheezes. She has no rales.  Mildly harsh breath sounds  Abdominal: Soft. Bowel sounds are normal. She exhibits no distension. There is no tenderness. There is no rebound and no guarding.  Musculoskeletal: Normal range of motion.  Neurological: She is alert and oriented to person, place, and time. No cranial nerve deficit.  Skin: Skin is warm and dry.  Psychiatric: She has a normal mood and affect. Her speech is normal.    ED Course  Procedures (including critical care time) Labs Review Labs Reviewed  CBC WITH DIFFERENTIAL - Abnormal; Notable for the following:    Hemoglobin 11.4 (*)    HCT 35.5 (*)    MCH 25.4 (*)    Neutrophils Relative % 80 (*)    Lymphocytes Relative 9 (*)    All other  components within normal limits  COMPREHENSIVE METABOLIC PANEL - Abnormal; Notable for the following:    Sodium 136 (*)    CO2 18 (*)    Glucose, Bld 211 (*)    GFR calc non Af Amer 73 (*)    GFR calc Af Amer 84 (*)    All other components within normal limits  CBG MONITORING, ED - Abnormal; Notable for the following:    Glucose-Capillary 275 (*)    All other components within normal limits  LIPASE, BLOOD  URINALYSIS, ROUTINE W REFLEX MICROSCOPIC    Imaging Review No results found.   EKG Interpretation None      MDM   Final diagnoses:  None    Patient with nausea vomiting diarrhea URI symptoms. Lab work is pending. Has been on antibiotics. Will give IV fluids and await labs.  Jasper Riling. Alvino Chapel, MD 10/19/13 [phone]

## 2013-10-19 NOTE — ED Notes (Signed)
Patient is on her cycle- urine specimen was red in color

## 2013-10-19 NOTE — ED Notes (Signed)
Bed: WA02 Expected date:  Expected time:  Means of arrival:  Comments: Hold for triage 4

## 2013-10-19 NOTE — ED Notes (Signed)
Per pt, went to [facility] yesterday and was told she had the flu and was given antibiotics-states her diabetes is getting affected and so is her gastroparesis

## 2013-12-03 IMAGING — CR DG ABD PORTABLE 1V
1 series · 1 of 1 positions shown · non-contrast
Comparison: None.

CLINICAL DATA: Abdominal pain

PORTABLE ABDOMEN - 1 VIEW

[AP]
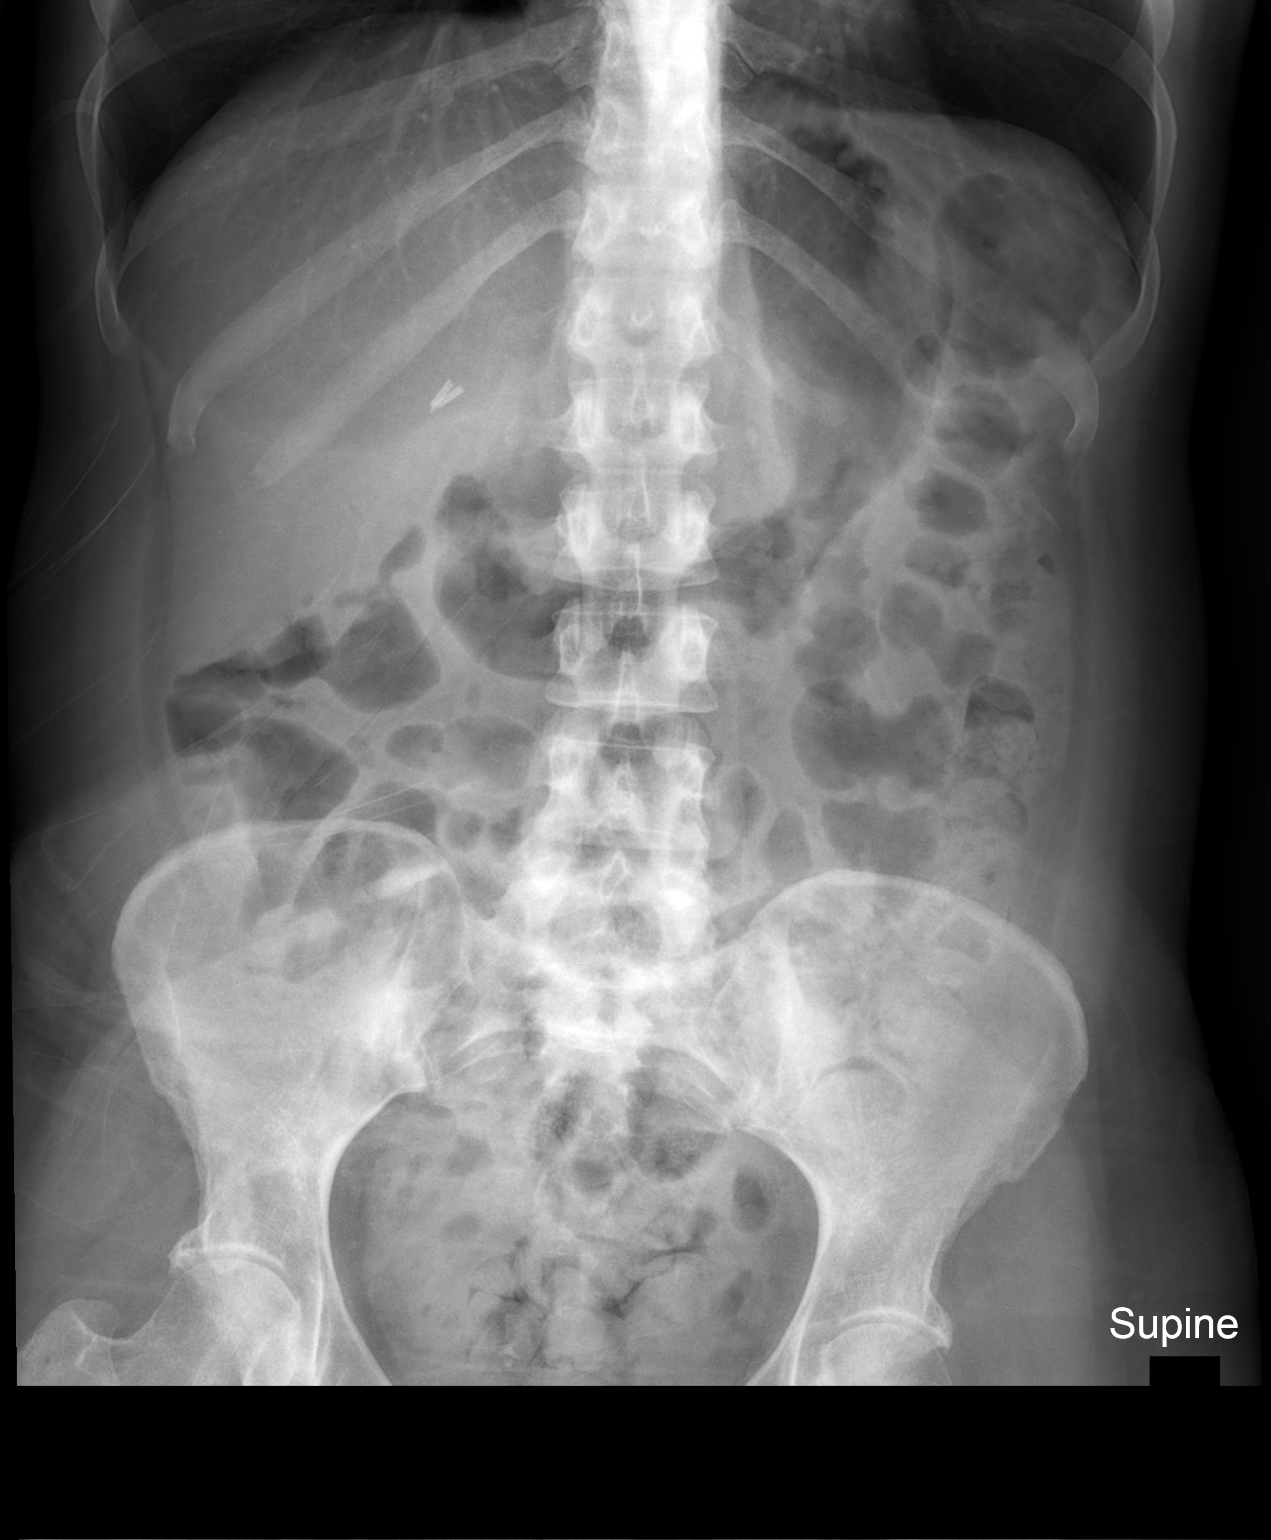

[1 of 1 positions shown; findings below may reference images not displayed]

FINDINGS: Scattered large and small bowel gas is noted.  No
abnormal mass or abnormal calcifications are seen.  No acute bony
abnormality is noted.  No free air is seen.  Fecal material is
noted throughout the colon.
IMPRESSION: No acute abnormality noted.

## 2014-02-01 ENCOUNTER — Other Ambulatory Visit: Payer: Self-pay | Admitting: Internal Medicine

## 2014-08-05 IMAGING — CR DG CHEST 2V
2 series · 2 of 2 positions shown · non-contrast
Comparison: November 25, 2012

CLINICAL DATA: Shortness of Breath

EXAM:
CHEST  2 VIEW

[w chest pa]
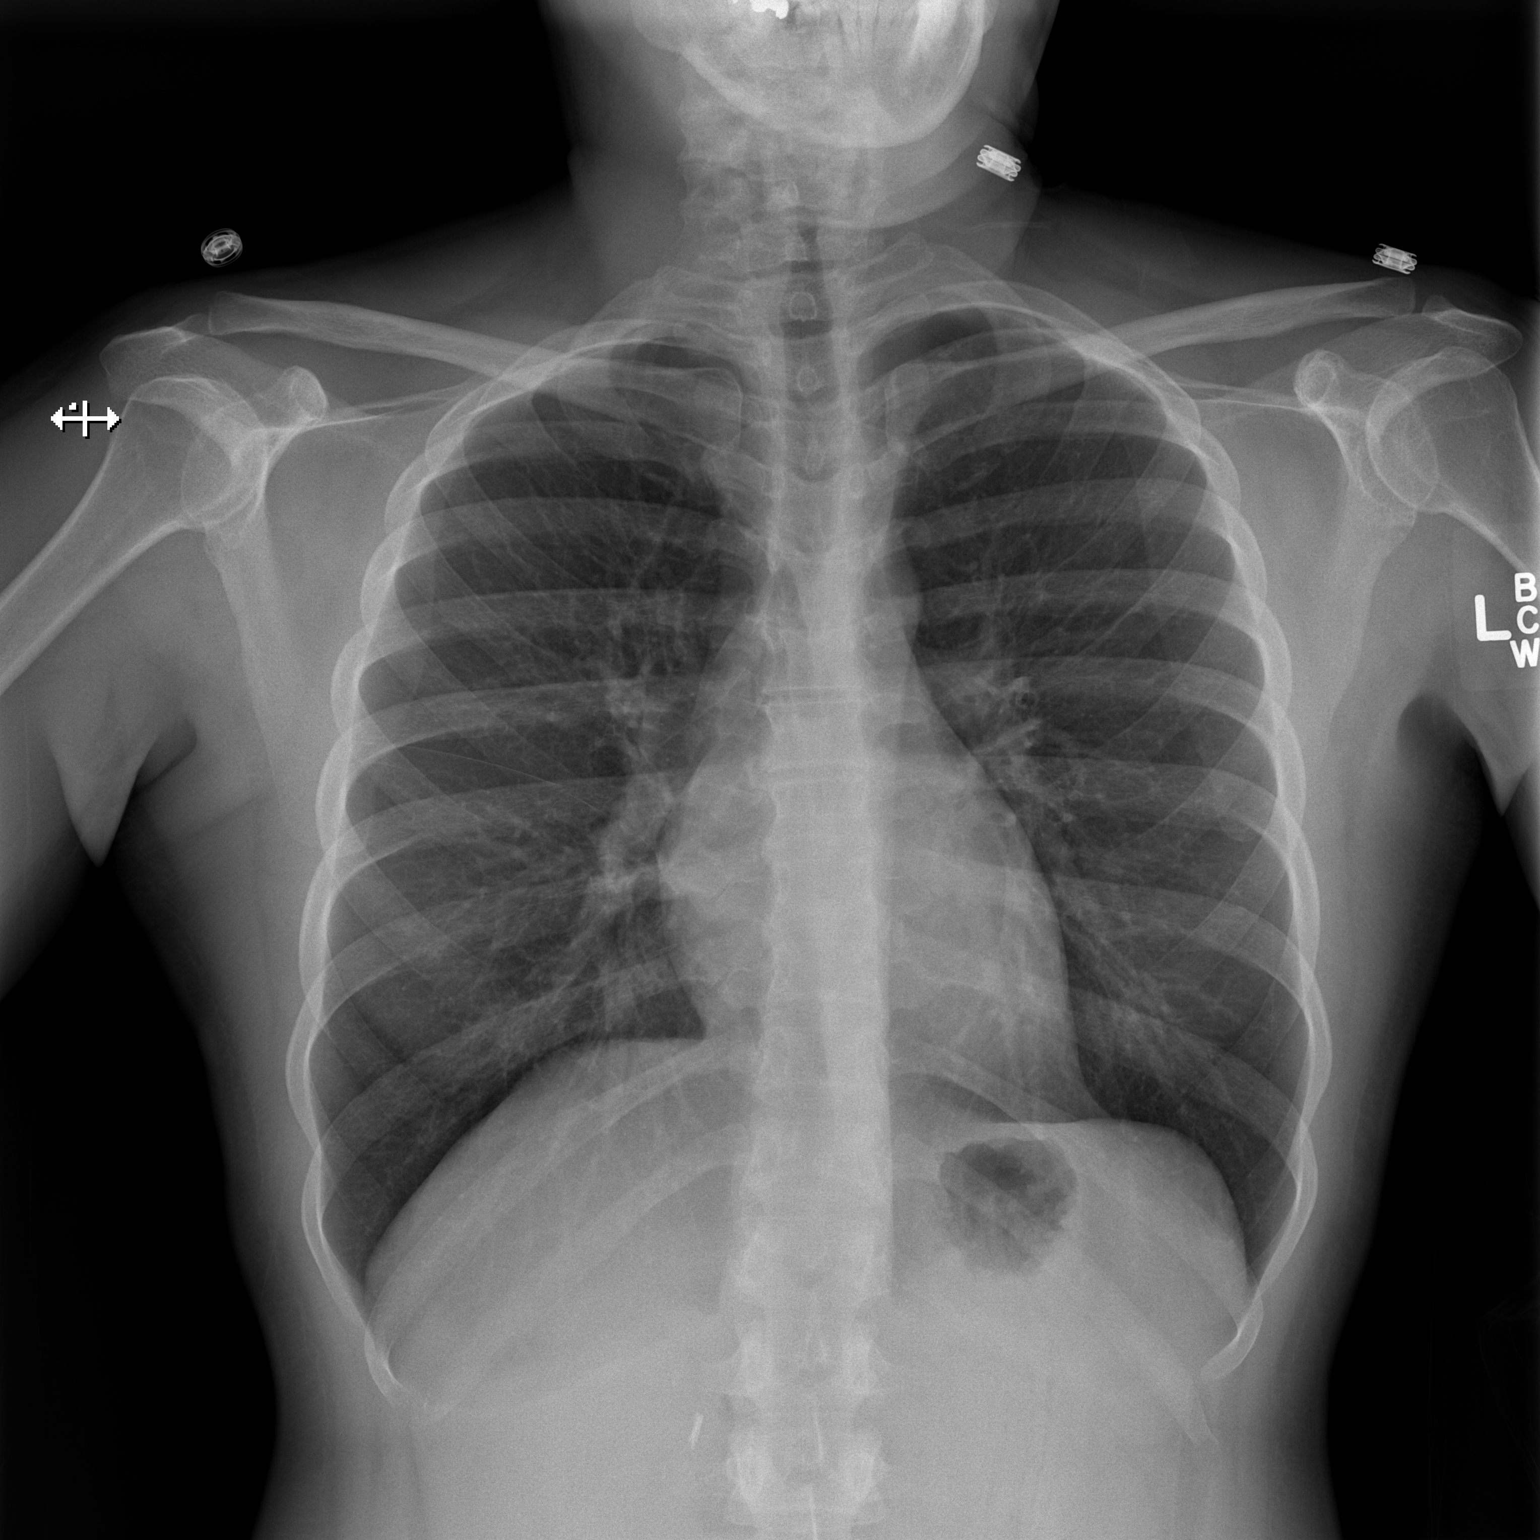

[w chest lat]
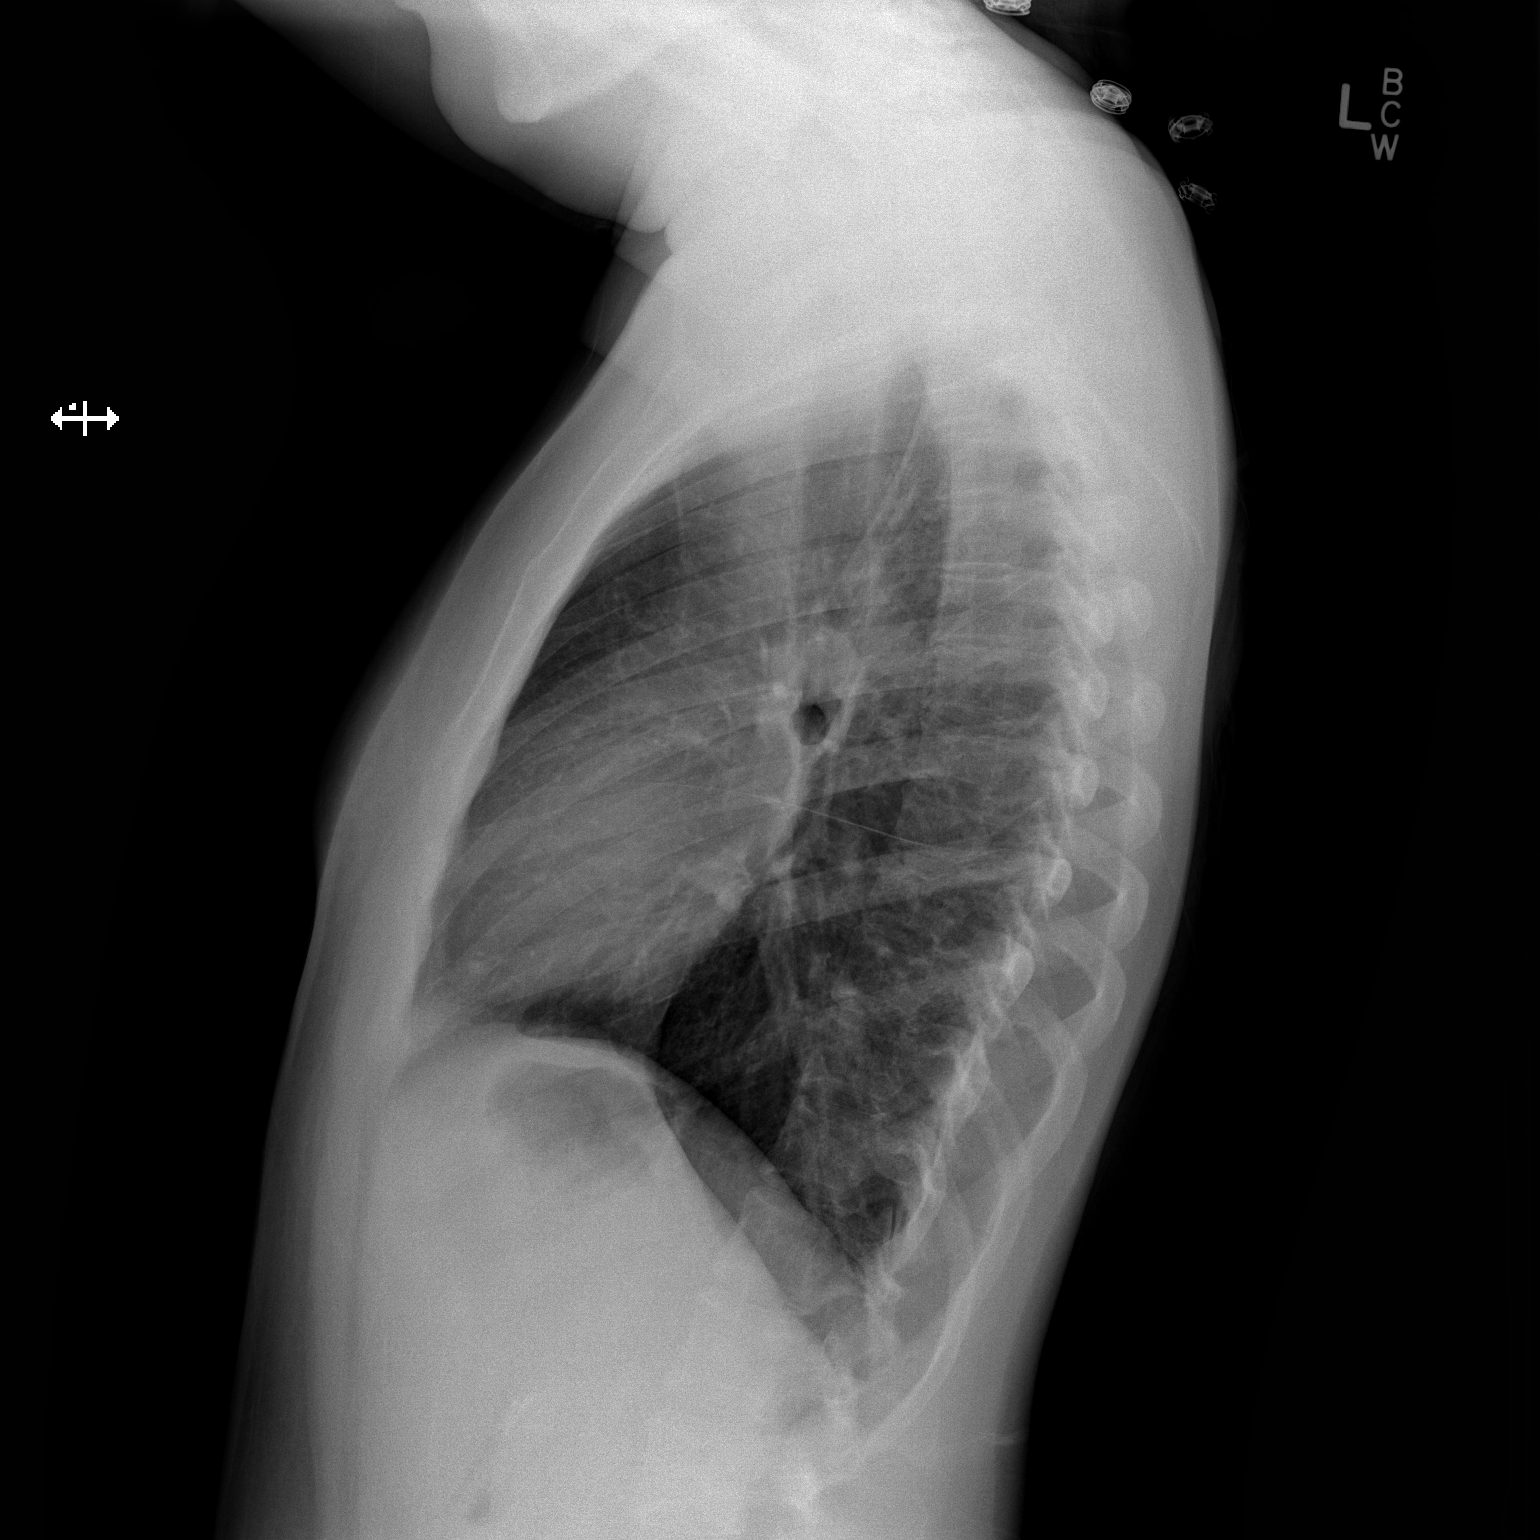

[2 of 2 positions shown; findings below may reference images not displayed]

FINDINGS: Lungs are clear. Heart size and pulmonary vascularity are normal. No
adenopathy. No pneumothorax. No bone lesions.
IMPRESSION: No edema or consolidation.

## 2014-09-27 HISTORY — PX: COLONOSCOPY: SHX174

## 2014-09-27 HISTORY — PX: ESOPHAGOGASTRODUODENOSCOPY: SHX1529

## 2014-10-19 ENCOUNTER — Ambulatory Visit: Payer: Self-pay | Admitting: Internal Medicine

## 2014-10-20 ENCOUNTER — Inpatient Hospital Stay (HOSPITAL_COMMUNITY)
Admission: EM | Admit: 2014-10-20 | Discharge: 2014-10-23 | DRG: 074 | Disposition: A | Discharge status: Home / Self Care | Payer: 59 | Attending: Internal Medicine | Admitting: Internal Medicine

## 2014-10-20 ENCOUNTER — Encounter (HOSPITAL_COMMUNITY): Payer: Self-pay | Admitting: Emergency Medicine

## 2014-10-20 DIAGNOSIS — E1043 Type 1 diabetes mellitus with diabetic autonomic (poly)neuropathy: Principal | ICD-10-CM | POA: Diagnosis present

## 2014-10-20 DIAGNOSIS — N179 Acute kidney failure, unspecified: Secondary | ICD-10-CM | POA: Diagnosis present

## 2014-10-20 DIAGNOSIS — K3184 Gastroparesis: Secondary | ICD-10-CM | POA: Diagnosis present

## 2014-10-20 DIAGNOSIS — Z833 Family history of diabetes mellitus: Secondary | ICD-10-CM

## 2014-10-20 DIAGNOSIS — R1110 Vomiting, unspecified: Secondary | ICD-10-CM

## 2014-10-20 DIAGNOSIS — E860 Dehydration: Secondary | ICD-10-CM | POA: Diagnosis present

## 2014-10-20 DIAGNOSIS — R110 Nausea: Secondary | ICD-10-CM

## 2014-10-20 DIAGNOSIS — R112 Nausea with vomiting, unspecified: Secondary | ICD-10-CM

## 2014-10-20 DIAGNOSIS — K219 Gastro-esophageal reflux disease without esophagitis: Secondary | ICD-10-CM | POA: Diagnosis present

## 2014-10-20 DIAGNOSIS — I10 Essential (primary) hypertension: Secondary | ICD-10-CM | POA: Diagnosis present

## 2014-10-20 DIAGNOSIS — Z8249 Family history of ischemic heart disease and other diseases of the circulatory system: Secondary | ICD-10-CM

## 2014-10-20 DIAGNOSIS — R1084 Generalized abdominal pain: Secondary | ICD-10-CM

## 2014-10-20 DIAGNOSIS — Z7982 Long term (current) use of aspirin: Secondary | ICD-10-CM

## 2014-10-20 DIAGNOSIS — Z79899 Other long term (current) drug therapy: Secondary | ICD-10-CM

## 2014-10-20 DIAGNOSIS — Z794 Long term (current) use of insulin: Secondary | ICD-10-CM

## 2014-10-20 DIAGNOSIS — K5900 Constipation, unspecified: Secondary | ICD-10-CM | POA: Diagnosis present

## 2014-10-20 DIAGNOSIS — E1069 Type 1 diabetes mellitus with other specified complication: Secondary | ICD-10-CM

## 2014-10-20 LAB — CBC WITH DIFFERENTIAL/PLATELET
BASOS ABS: 0.1 10*3/uL (ref 0.0–0.1)
BASOS PCT: 1 % (ref 0–1)
EOS ABS: 0.1 10*3/uL (ref 0.0–0.7)
Eosinophils Relative: 2 % (ref 0–5)
HCT: 42.4 % (ref 36.0–46.0)
HEMOGLOBIN: 13.4 g/dL (ref 12.0–15.0)
LYMPHS PCT: 18 % (ref 12–46)
Lymphs Abs: 1.3 10*3/uL (ref 0.7–4.0)
MCH: 25.2 pg — ABNORMAL LOW (ref 26.0–34.0)
MCHC: 31.6 g/dL (ref 30.0–36.0)
MCV: 79.8 fL (ref 78.0–100.0)
MONO ABS: 0.6 10*3/uL (ref 0.1–1.0)
Monocytes Relative: 8 % (ref 3–12)
NEUTROS PCT: 71 % (ref 43–77)
Neutro Abs: 5.1 10*3/uL (ref 1.7–7.7)
PLATELETS: 388 10*3/uL (ref 150–400)
RBC: 5.31 MIL/uL — ABNORMAL HIGH (ref 3.87–5.11)
RDW: 22.5 % — ABNORMAL HIGH (ref 11.5–15.5)
WBC: 7.2 10*3/uL (ref 4.0–10.5)

## 2014-10-20 LAB — URINALYSIS, ROUTINE W REFLEX MICROSCOPIC
BILIRUBIN URINE: NEGATIVE
GLUCOSE, UA: NEGATIVE mg/dL
HGB URINE DIPSTICK: NEGATIVE
KETONES UR: NEGATIVE mg/dL
Leukocytes, UA: NEGATIVE
Nitrite: NEGATIVE
PH: 7.5 (ref 5.0–8.0)
PROTEIN: 30 mg/dL — AB
Specific Gravity, Urine: 1.017 (ref 1.005–1.030)
Urobilinogen, UA: 0.2 mg/dL (ref 0.0–1.0)

## 2014-10-20 LAB — COMPREHENSIVE METABOLIC PANEL
ALK PHOS: 86 U/L (ref 39–117)
ALT: 23 U/L (ref 0–35)
AST: 33 U/L (ref 0–37)
Albumin: 4.3 g/dL (ref 3.5–5.2)
Anion gap: 11 (ref 5–15)
BUN: 22 mg/dL (ref 6–23)
CALCIUM: 9.7 mg/dL (ref 8.4–10.5)
CO2: 26 mmol/L (ref 19–32)
Chloride: 98 mmol/L (ref 96–112)
Creatinine, Ser: 1.14 mg/dL — ABNORMAL HIGH (ref 0.50–1.10)
GFR, EST AFRICAN AMERICAN: 65 mL/min — AB (ref >90)
GFR, EST NON AFRICAN AMERICAN: 56 mL/min — AB (ref >90)
GLUCOSE: 170 mg/dL — AB (ref 70–99)
Potassium: 3.7 mmol/L (ref 3.5–5.1)
SODIUM: 135 mmol/L (ref 135–145)
Total Bilirubin: 0.8 mg/dL (ref 0.3–1.2)
Total Protein: 8.3 g/dL (ref 6.0–8.3)

## 2014-10-20 LAB — CBG MONITORING, ED: GLUCOSE-CAPILLARY: 188 mg/dL — AB (ref 70–99)

## 2014-10-20 LAB — URINE MICROSCOPIC-ADD ON

## 2014-10-20 LAB — BLOOD GAS, VENOUS
Acid-Base Excess: 3.6 mmol/L — ABNORMAL HIGH (ref 0.0–2.0)
BICARBONATE: 28.4 meq/L — AB (ref 20.0–24.0)
O2 Saturation: 70.5 %
PATIENT TEMPERATURE: 98.6
PH VEN: 7.409 — AB (ref 7.250–7.300)
PO2 VEN: 40.4 mmHg (ref 30.0–45.0)
TCO2: 25.4 mmol/L (ref 0–100)
pCO2, Ven: 45.8 mmHg (ref 45.0–50.0)

## 2014-10-20 LAB — PREGNANCY, URINE: PREG TEST UR: NEGATIVE

## 2014-10-20 MED ORDER — ONDANSETRON 8 MG PO TBDP: ORAL_TABLET | ORAL | Status: DC

## 2014-10-20 MED ORDER — PROMETHAZINE HCL 25 MG/ML IJ SOLN
25.0000 mg | Freq: Once | INTRAMUSCULAR | Status: AC
  Administered 2014-10-20: 25 mg via INTRAVENOUS
  Filled 2014-10-20: qty 1

## 2014-10-20 MED ORDER — HYDROMORPHONE HCL 1 MG/ML IJ SOLN
1.0000 mg | Freq: Once | INTRAMUSCULAR | Status: AC
  Administered 2014-10-20: 1 mg via INTRAVENOUS
  Filled 2014-10-20: qty 1

## 2014-10-20 MED ORDER — LORAZEPAM 2 MG/ML IJ SOLN
1.0000 mg | Freq: Once | INTRAMUSCULAR | Status: AC
  Administered 2014-10-20: 1 mg via INTRAVENOUS
  Filled 2014-10-20: qty 1

## 2014-10-20 MED ORDER — ONDANSETRON 8 MG PO TBDP
8.0000 mg | ORAL_TABLET | Freq: Once | ORAL | Status: AC
  Administered 2014-10-20: 8 mg via ORAL
  Filled 2014-10-20: qty 1

## 2014-10-20 MED ORDER — SODIUM CHLORIDE 0.9 % IV BOLUS (SEPSIS)
1000.0000 mL | Freq: Once | INTRAVENOUS | Status: AC
  Administered 2014-10-20: 1000 mL via INTRAVENOUS

## 2014-10-20 NOTE — ED Notes (Signed)
Pt reports generalized abd pain and emesis since 0900 this am. Hx of diabetes, taking medications as prescribed. Pt reports she has been hospitalized for diabetes several times in past 3 months.

## 2014-10-20 NOTE — ED Provider Notes (Signed)
CSN: [id number]     Arrival date & time 10/20/14  1317 History   First MD Initiated Contact with Patient 10/20/14 1544     Chief Complaint  Patient presents with  . Abdominal Pain  . Emesis     (Consider location/radiation/quality/duration/timing/severity/associated sxs/prior Treatment) Patient is a 49 y.o. female presenting with abdominal pain and vomiting. The history is provided by the patient and medical records. No language interpreter was used.  Abdominal Pain Associated symptoms: nausea and vomiting   Associated symptoms: no chest pain, no constipation, no cough, no diarrhea, no dysuria, no fatigue, no fever, no hematuria and no shortness of breath   Emesis Associated symptoms: abdominal pain   Associated symptoms: no diarrhea and no headaches      [name] is a 49 y.o. female  with a hx of gastroparesis, IDDM, HTN, anemia, GERD, diabetic neuropathy presents to the Emergency Department complaining of gradual, persistent, progressively worsening epigastric abd pain, nausea and vomiting onset 9am.  Pt reports she has this is similar to previous episodes.  She reports her last dose of insulin was at 11 AM this morning. She reports being compliant with both her basal and short-acting insulin.  She is currently taking 15 units of Levemir at bedtime and is using sliding scale short-acting insulin.  Patient denies fevers, chills, headache, neck pain, chest pain, shortness of breath, weakness, dizziness, syncope, dysuria. She also denies polydipsia and polyuria.  Past Medical History  Diagnosis Date  . Diabetes mellitus     diagnosed age 83  . Hypertension   . Cataract   . Anxiety   . Anemia   . GERD (gastroesophageal reflux disease)   . Constipation 04/03/2012  . Complication of anesthesia   . PONV (postoperative nausea and vomiting)   . Asthma     IN CHILDHOOD  . Neuromuscular disorder     Diabetic neuropathy  . Gastroparesis   . Diabetic neuropathy   . History of blood  product transfusion    Past Surgical History  Procedure Laterality Date  . Retinal detachment surgery    . Posterior vitrectomy and membrane peel-left eye  09/28/2002  . Posterior vitrectomy and membrane peel-right eye  03/16/2002  . Eye surgery    . Gailstones     Family History  Problem Relation Age of Onset  . Cystic fibrosis Mother   . Hypertension Father   . Diabetes Brother   . Hypertension Maternal Grandmother    History  Substance Use Topics  . Smoking status: Never Smoker   . Smokeless tobacco: Never Used  . Alcohol Use: Yes     Comment: occas  wine   OB History    No data available     Review of Systems  Constitutional: Negative for fever, diaphoresis, appetite change, fatigue and unexpected weight change.  HENT: Negative for mouth sores.   Eyes: Negative for visual disturbance.  Respiratory: Negative for cough, chest tightness, shortness of breath and wheezing.   Cardiovascular: Negative for chest pain.  Gastrointestinal: Positive for nausea, vomiting and abdominal pain. Negative for diarrhea and constipation.  Endocrine: Negative for polydipsia, polyphagia and polyuria.  Genitourinary: Negative for dysuria, urgency, frequency and hematuria.  Musculoskeletal: Negative for back pain and neck stiffness.  Skin: Negative for rash.  Allergic/Immunologic: Negative for immunocompromised state.  Neurological: Negative for syncope, light-headedness and headaches.  Hematological: Does not bruise/bleed easily.  Psychiatric/Behavioral: Negative for sleep disturbance. The patient is not nervous/anxious.       Allergies  Anesthetics, amide; Penicillins; and Morphine and related  Home Medications   Prior to Admission medications   Medication Sig Start Date End Date Taking? Authorizing Provider  ALPRAZolam (XANAX) 0.25 MG tablet Take 0.25 mg by mouth 2 (two) times daily.   Yes Historical Provider, MD  aspirin EC 81 MG EC tablet Take 1 tablet (81 mg total) by mouth  daily. 11/07/12  Yes Charlynne Cousins, MD  ferrous sulfate 325 (65 FE) MG tablet Take 325 mg by mouth daily.    Yes Historical Provider, MD  furosemide (LASIX) 20 MG tablet Take 2 tablets (40 mg total) by mouth 2 (two) times daily. 11/07/12  Yes Charlynne Cousins, MD  insulin aspart (NOVOLOG) 100 UNIT/ML injection Inject 0-15 Units into the skin 3 (three) times daily with meals. 11/28/12  Yes Nishant Dhungel, MD  insulin detemir (LEVEMIR) 100 UNIT/ML injection Inject 0.15 mLs (15 Units total) into the skin at bedtime. 02/17/13  Yes Robbie Lis, MD  lisinopril (PRINIVIL,ZESTRIL) 10 MG tablet Take 10 mg by mouth every morning.    Yes Historical Provider, MD  pregabalin (LYRICA) 150 MG capsule Take 1 capsule (150 mg total) by mouth 2 (two) times daily. 02/17/13  Yes Robbie Lis, MD  ondansetron (ZOFRAN ODT) 8 MG disintegrating tablet 8mg  ODT q4 hours prn nausea 10/20/14   Hendrixx Severin, PA-C  ondansetron (ZOFRAN) 4 MG tablet Take 1 tablet (4 mg total) by mouth every 6 (six) hours. Patient not taking: Reported on 10/20/2014 10/19/13   [name], MD  promethazine (PHENERGAN) 12.5 MG tablet Take 1 tablet (12.5 mg total) by mouth every 6 (six) hours as needed for nausea (for refractory anusea and/or vomiting). Patient not taking: Reported on 10/20/2014 02/17/13   Robbie Lis, MD  promethazine (PHENERGAN) 25 MG tablet Take 25 mg by mouth every 6 (six) hours as needed. Nausea and vomitting 10/18/13   Historical Provider, MD   BP 152/67 mmHg  Pulse 110  Temp(Src) 98.9 F (37.2 C) (Oral)  Resp 16  Ht 5\' 4"  (1.626 m)  Wt 138 lb 7.2 oz (62.8 kg)  BMI 23.75 kg/m2  SpO2 100% Physical Exam  Constitutional: She appears well-developed and well-nourished. No distress.  Awake, alert, nontoxic appearance  HENT:  Head: Normocephalic and atraumatic.  Mouth/Throat: Oropharynx is clear and moist. No oropharyngeal exudate.  Eyes: Conjunctivae are normal. No scleral icterus.  Neck: Normal range of  motion. Neck supple.  Cardiovascular: Regular rhythm, normal heart sounds and intact distal pulses.  Tachycardia present.   Pulses:      Radial pulses are 2+ on the right side, and 2+ on the left side.       Dorsalis pedis pulses are 2+ on the right side, and 2+ on the left side.  Pulmonary/Chest: Effort normal and breath sounds normal. No respiratory distress. She has no wheezes.  Equal chest expansion  Abdominal: Soft. Bowel sounds are normal. She exhibits no distension and no mass. There is no tenderness. There is no rebound and no guarding.  Musculoskeletal: Normal range of motion. She exhibits edema (1+ pitting, bilaterally).  Neurological: She is alert.  Speech is clear and goal oriented Moves extremities without ataxia  Skin: Skin is warm and dry. She is not diaphoretic.  Psychiatric: She has a normal mood and affect.  Nursing note and vitals reviewed.   ED Course  Procedures (including critical care time) Labs Review Labs Reviewed  COMPREHENSIVE METABOLIC PANEL - Abnormal; Notable for the following:  Glucose, Bld 170 (*)    Creatinine, Ser 1.14 (*)    GFR calc non Af Amer 56 (*)    GFR calc Af Amer 65 (*)    All other components within normal limits  CBC WITH DIFFERENTIAL/PLATELET - Abnormal; Notable for the following:    RBC 5.31 (*)    MCH 25.2 (*)    RDW 22.5 (*)    All other components within normal limits  URINALYSIS, ROUTINE W REFLEX MICROSCOPIC - Abnormal; Notable for the following:    Protein, ur 30 (*)    All other components within normal limits  BLOOD GAS, VENOUS - Abnormal; Notable for the following:    pH, Ven 7.409 (*)    Bicarbonate 28.4 (*)    Acid-Base Excess 3.6 (*)    All other components within normal limits  URINE MICROSCOPIC-ADD ON - Abnormal; Notable for the following:    Squamous Epithelial / LPF FEW (*)    All other components within normal limits  CBG MONITORING, ED - Abnormal; Notable for the following:    Glucose-Capillary 188 (*)     All other components within normal limits  PREGNANCY, URINE  BASIC METABOLIC PANEL  CBC  HEMOGLOBIN A1C  CBC  CREATININE, SERUM  URINE RAPID DRUG SCREEN (HOSP PERFORMED)    Imaging Review No results found.   EKG Interpretation None      MDM   Final diagnoses:  Gastroparesis  Type 1 diabetes mellitus with other specified complication  Intractable vomiting with nausea, vomiting of unspecified type   [name] presents with nausea, vomiting and abd pain consistent with previous episodes of her gastroparesis.  Will obtain labs and give fluids. Blood sugar this time is 188.  6:12 PM Labs reassuring and without evidence of DKA. Will attempt to control patient's symptoms here in the emergency department and by mouth trial.  10:48 PM Patient ambulates without further emesis. She has tolerated by mouth liquids 3 eight ounce glasses without further vomiting.  Pt looks well and her vitals are stable.    After discharge, pt vomited a small amount x1 in the room and pt's son refuses to take her home.  Will consult for admission.    BP 152/67 mmHg  Pulse 110  Temp(Src) 98.9 F (37.2 C) (Oral)  Resp 16  Ht 5\' 4"  (1.626 m)  Wt 138 lb 7.2 oz (62.8 kg)  BMI 23.75 kg/m2  SpO2 100%   Pt admitted by Dr. Hal Hope to [facility].    Jarrett Soho Chamya Hunton, PA-C 10/21/14 [place], DO 10/27/14 [id number]

## 2014-10-20 NOTE — ED Notes (Signed)
Informed the pt that a urine specimen is needed. 

## 2014-10-21 ENCOUNTER — Ambulatory Visit (HOSPITAL_COMMUNITY): Payer: 59

## 2014-10-21 ENCOUNTER — Observation Stay (HOSPITAL_COMMUNITY): Payer: 59

## 2014-10-21 ENCOUNTER — Encounter (HOSPITAL_COMMUNITY): Payer: Self-pay | Admitting: Internal Medicine

## 2014-10-21 DIAGNOSIS — G43A1 Cyclical vomiting, intractable: Secondary | ICD-10-CM

## 2014-10-21 DIAGNOSIS — E109 Type 1 diabetes mellitus without complications: Secondary | ICD-10-CM

## 2014-10-21 DIAGNOSIS — I10 Essential (primary) hypertension: Secondary | ICD-10-CM | POA: Diagnosis not present

## 2014-10-21 DIAGNOSIS — R1110 Vomiting, unspecified: Secondary | ICD-10-CM

## 2014-10-21 DIAGNOSIS — N179 Acute kidney failure, unspecified: Secondary | ICD-10-CM

## 2014-10-21 LAB — GLUCOSE, CAPILLARY
Glucose-Capillary: 366 mg/dL — ABNORMAL HIGH (ref 70–99)
Glucose-Capillary: 368 mg/dL — ABNORMAL HIGH (ref 70–99)
Glucose-Capillary: 157 mg/dL — ABNORMAL HIGH (ref 70–99)
Glucose-Capillary: 121 mg/dL — ABNORMAL HIGH (ref 70–99)
Glucose-Capillary: 160 mg/dL — ABNORMAL HIGH (ref 70–99)

## 2014-10-21 LAB — BASIC METABOLIC PANEL
ANION GAP: 14 (ref 5–15)
BUN: 17 mg/dL (ref 6–23)
CHLORIDE: 97 mmol/L (ref 96–112)
CO2: 22 mmol/L (ref 19–32)
CREATININE: 1.13 mg/dL — AB (ref 0.50–1.10)
Calcium: 8.7 mg/dL (ref 8.4–10.5)
GFR calc Af Amer: 66 mL/min — ABNORMAL LOW (ref >90)
GFR calc non Af Amer: 57 mL/min — ABNORMAL LOW (ref >90)
Glucose, Bld: 440 mg/dL — ABNORMAL HIGH (ref 70–99)
Potassium: 4.4 mmol/L (ref 3.5–5.1)
SODIUM: 133 mmol/L — AB (ref 135–145)

## 2014-10-21 LAB — CBC
HCT: 37.5 % (ref 36.0–46.0)
Hemoglobin: 11.5 g/dL — ABNORMAL LOW (ref 12.0–15.0)
MCH: 24.8 pg — ABNORMAL LOW (ref 26.0–34.0)
MCHC: 30.7 g/dL (ref 30.0–36.0)
MCV: 81 fL (ref 78.0–100.0)
PLATELETS: 332 10*3/uL (ref 150–400)
RBC: 4.63 MIL/uL (ref 3.87–5.11)
RDW: 22.6 % — ABNORMAL HIGH (ref 11.5–15.5)
WBC: 9.6 10*3/uL (ref 4.0–10.5)

## 2014-10-21 LAB — RAPID URINE DRUG SCREEN, HOSP PERFORMED
Amphetamines: NOT DETECTED
BARBITURATES: NOT DETECTED
BENZODIAZEPINES: NOT DETECTED
COCAINE: NOT DETECTED
Opiates: NOT DETECTED
Tetrahydrocannabinol: NOT DETECTED

## 2014-10-21 LAB — LIPASE, BLOOD: LIPASE: 27 U/L (ref 11–59)

## 2014-10-21 MED ORDER — LISINOPRIL 10 MG PO TABS
10.0000 mg | ORAL_TABLET | Freq: Every morning | ORAL | Status: DC
  Administered 2014-10-22 – 2014-10-23 (×2): 10 mg via ORAL
  Filled 2014-10-21 (×3): qty 1

## 2014-10-21 MED ORDER — ONDANSETRON HCL 4 MG PO TABS
4.0000 mg | ORAL_TABLET | Freq: Four times a day (QID) | ORAL | Status: DC
  Administered 2014-10-21: 4 mg via ORAL
  Filled 2014-10-21 (×5): qty 1

## 2014-10-21 MED ORDER — ONDANSETRON HCL 4 MG PO TABS: 4.0000 mg | ORAL_TABLET | Freq: Four times a day (QID) | ORAL | Status: DC | PRN

## 2014-10-21 MED ORDER — INSULIN DETEMIR 100 UNIT/ML ~~LOC~~ SOLN
16.0000 [IU] | Freq: Every day | SUBCUTANEOUS | Status: DC
  Administered 2014-10-21 – 2014-10-22 (×2): 16 [IU] via SUBCUTANEOUS
  Filled 2014-10-21 (×2): qty 0.16

## 2014-10-21 MED ORDER — INSULIN ASPART 100 UNIT/ML ~~LOC~~ SOLN
0.0000 [IU] | Freq: Three times a day (TID) | SUBCUTANEOUS | Status: DC
  Administered 2014-10-21: 9 [IU] via SUBCUTANEOUS

## 2014-10-21 MED ORDER — INSULIN ASPART 100 UNIT/ML ~~LOC~~ SOLN
0.0000 [IU] | Freq: Three times a day (TID) | SUBCUTANEOUS | Status: DC
  Administered 2014-10-21: 1 [IU] via SUBCUTANEOUS
  Administered 2014-10-21: 2 [IU] via SUBCUTANEOUS
  Administered 2014-10-22: 5 [IU] via SUBCUTANEOUS
  Administered 2014-10-23: 2 [IU] via SUBCUTANEOUS

## 2014-10-21 MED ORDER — ZOLPIDEM TARTRATE 5 MG PO TABS
5.0000 mg | ORAL_TABLET | Freq: Once | ORAL | Status: DC
  Filled 2014-10-21: qty 1

## 2014-10-21 MED ORDER — SODIUM CHLORIDE 0.9 % IV SOLN: INTRAVENOUS | Status: DC

## 2014-10-21 MED ORDER — FERROUS SULFATE 325 (65 FE) MG PO TABS
325.0000 mg | ORAL_TABLET | Freq: Every day | ORAL | Status: DC
Start: 2014-10-21 — End: 2014-10-23
  Administered 2014-10-22 – 2014-10-23 (×2): 325 mg via ORAL
  Filled 2014-10-21 (×3): qty 1

## 2014-10-21 MED ORDER — IOHEXOL 300 MG/ML  SOLN: 25.0000 mL | INTRAMUSCULAR | Status: DC

## 2014-10-21 MED ORDER — PREGABALIN 75 MG PO CAPS
150.0000 mg | ORAL_CAPSULE | Freq: Two times a day (BID) | ORAL | Status: DC
  Administered 2014-10-22 – 2014-10-23 (×3): 150 mg via ORAL
  Filled 2014-10-21 (×5): qty 2

## 2014-10-21 MED ORDER — ENOXAPARIN SODIUM 40 MG/0.4ML ~~LOC~~ SOLN
40.0000 mg | SUBCUTANEOUS | Status: DC
  Administered 2014-10-22: 40 mg via SUBCUTANEOUS
  Filled 2014-10-21 (×3): qty 0.4

## 2014-10-21 MED ORDER — ZOLPIDEM TARTRATE 5 MG PO TABS
5.0000 mg | ORAL_TABLET | Freq: Once | ORAL | Status: AC
  Administered 2014-10-21: 5 mg via ORAL
  Filled 2014-10-21: qty 1

## 2014-10-21 MED ORDER — TRAMADOL HCL 50 MG PO TABS: 50.0000 mg | ORAL_TABLET | Freq: Once | ORAL | Status: DC

## 2014-10-21 MED ORDER — PROMETHAZINE HCL 25 MG/ML IJ SOLN
12.5000 mg | Freq: Once | INTRAMUSCULAR | Status: AC
  Administered 2014-10-21: 12.5 mg via INTRAVENOUS
  Filled 2014-10-21: qty 1

## 2014-10-21 MED ORDER — SODIUM CHLORIDE 0.9 % IV SOLN
INTRAVENOUS | Status: AC
  Administered 2014-10-21 (×2): via INTRAVENOUS

## 2014-10-21 MED ORDER — INSULIN ASPART 100 UNIT/ML ~~LOC~~ SOLN
0.0000 [IU] | Freq: Every day | SUBCUTANEOUS | Status: DC
  Administered 2014-10-22: 3 [IU] via SUBCUTANEOUS

## 2014-10-21 MED ORDER — INSULIN DETEMIR 100 UNIT/ML ~~LOC~~ SOLN
15.0000 [IU] | Freq: Every day | SUBCUTANEOUS | Status: DC
  Administered 2014-10-21: 15 [IU] via SUBCUTANEOUS
  Filled 2014-10-21: qty 0.15

## 2014-10-21 MED ORDER — LORAZEPAM 2 MG/ML IJ SOLN
0.5000 mg | Freq: Once | INTRAMUSCULAR | Status: AC
  Administered 2014-10-21: 0.5 mg via INTRAVENOUS
  Filled 2014-10-21: qty 1

## 2014-10-21 MED ORDER — ACETAMINOPHEN 325 MG PO TABS: 650.0000 mg | ORAL_TABLET | Freq: Four times a day (QID) | ORAL | Status: DC | PRN

## 2014-10-21 MED ORDER — ASPIRIN EC 81 MG PO TBEC
81.0000 mg | DELAYED_RELEASE_TABLET | Freq: Every day | ORAL | Status: DC
  Administered 2014-10-22 – 2014-10-23 (×2): 81 mg via ORAL
  Filled 2014-10-21 (×3): qty 1

## 2014-10-21 MED ORDER — ONDANSETRON HCL 4 MG/2ML IJ SOLN: 4.0000 mg | Freq: Three times a day (TID) | INTRAMUSCULAR | Status: DC | PRN

## 2014-10-21 MED ORDER — ACETAMINOPHEN 650 MG RE SUPP: 650.0000 mg | Freq: Four times a day (QID) | RECTAL | Status: DC | PRN

## 2014-10-21 MED ORDER — IOHEXOL 300 MG/ML  SOLN
100.0000 mL | Freq: Once | INTRAMUSCULAR | Status: AC | PRN
  Administered 2014-10-21: 100 mL via INTRAVENOUS

## 2014-10-21 MED ORDER — ONDANSETRON HCL 4 MG/2ML IJ SOLN
4.0000 mg | Freq: Four times a day (QID) | INTRAMUSCULAR | Status: DC
  Administered 2014-10-21 – 2014-10-23 (×7): 4 mg via INTRAVENOUS
  Filled 2014-10-21 (×7): qty 2

## 2014-10-21 MED ORDER — ONDANSETRON HCL 4 MG/2ML IJ SOLN
4.0000 mg | Freq: Four times a day (QID) | INTRAMUSCULAR | Status: DC | PRN
  Administered 2014-10-21 (×2): 4 mg via INTRAVENOUS
  Filled 2014-10-21 (×2): qty 2

## 2014-10-21 MED ORDER — PROMETHAZINE HCL 25 MG/ML IJ SOLN
12.5000 mg | INTRAMUSCULAR | Status: DC | PRN
  Administered 2014-10-22: 12.5 mg via INTRAVENOUS
  Filled 2014-10-21: qty 1

## 2014-10-21 NOTE — H&P (Addendum)
[facility] History and Physical  Symone Cornman QIH:[id number] DOB: 26-Sep-1965 DOA: 10/20/2014  Referring physician: Abigail Butts. PA. PCP: Barbette Merino, MD  Specialists: None.  Chief Complaint: Nausea vomiting.  HPI: [name] is a 49 y.o. female with history of diabetes mellitus type 1, hypertension presents to the ER because of persistent nausea vomiting since morning. Patient states he was not able to take in anything and has had some crampy abdominal pain off and on. Denies any diarrhea. In the ER patient was observed and was about to be discharged when patient again had vomiting. Abdomen exam was benign. Blood work shows mild dehydration with mildly elevated creatinine from baseline. Patient states that 3 weeks ago patient was admitted at [facility] for nausea vomiting and at that time patient's hemoglobin was low and patient was transfused PRBC and also had EGD and colonoscopy which were unremarkable as per the patient. Patient states she cannot tolerate Reglan as it causes difficulty talking. Patient at this time has been admitted for nausea vomiting most likely from diabetic gastroparesis with dehydration at this time.  Review of Systems: As presented in the history of presenting illness, rest negative.  Past Medical History  Diagnosis Date  . Diabetes mellitus     diagnosed age 9  . Hypertension   . Cataract   . Anxiety   . Anemia   . GERD (gastroesophageal reflux disease)   . Constipation 04/03/2012  . Complication of anesthesia   . PONV (postoperative nausea and vomiting)   . Asthma     IN CHILDHOOD  . Neuromuscular disorder     Diabetic neuropathy  . Gastroparesis   . Diabetic neuropathy   . History of blood product transfusion    Past Surgical History  Procedure Laterality Date  . Retinal detachment surgery    . Posterior vitrectomy and membrane peel-left eye  09/28/2002  . Posterior vitrectomy and membrane peel-right eye   03/16/2002  . Eye surgery    . Gailstones     Social History:  reports that she has never smoked. She has never used smokeless tobacco. She reports that she drinks alcohol. She reports that she does not use illicit drugs. Where does patient live home. Can patient participate in ADLs? Yes.  Allergies  Allergen Reactions  . Anesthetics, Amide Nausea And Vomiting  . Penicillins   . Morphine And Related Rash    Family History:  Family History  Problem Relation Age of Onset  . Cystic fibrosis Mother   . Hypertension Father   . Diabetes Brother   . Hypertension Maternal Grandmother       Prior to Admission medications   Medication Sig Start Date End Date Taking? Authorizing Provider  ALPRAZolam (XANAX) 0.25 MG tablet Take 0.25 mg by mouth 2 (two) times daily.   Yes Historical Provider, MD  aspirin EC 81 MG EC tablet Take 1 tablet (81 mg total) by mouth daily. 11/07/12  Yes Charlynne Cousins, MD  ferrous sulfate 325 (65 FE) MG tablet Take 325 mg by mouth daily.    Yes Historical Provider, MD  furosemide (LASIX) 20 MG tablet Take 2 tablets (40 mg total) by mouth 2 (two) times daily. 11/07/12  Yes Charlynne Cousins, MD  insulin aspart (NOVOLOG) 100 UNIT/ML injection Inject 0-15 Units into the skin 3 (three) times daily with meals. 11/28/12  Yes Nishant Dhungel, MD  insulin detemir (LEVEMIR) 100 UNIT/ML injection Inject 0.15 mLs (15 Units total) into the skin at bedtime. 02/17/13  Yes Robbie Lis, MD  lisinopril (PRINIVIL,ZESTRIL) 10 MG tablet Take 10 mg by mouth every morning.    Yes Historical Provider, MD  pregabalin (LYRICA) 150 MG capsule Take 1 capsule (150 mg total) by mouth 2 (two) times daily. 02/17/13  Yes Robbie Lis, MD  ondansetron (ZOFRAN ODT) 8 MG disintegrating tablet 8mg  ODT q4 hours prn nausea 10/20/14   Hannah Muthersbaugh, PA-C  ondansetron (ZOFRAN) 4 MG tablet Take 1 tablet (4 mg total) by mouth every 6 (six) hours. Patient not taking: Reported on 10/20/2014 10/19/13    [name], MD  promethazine (PHENERGAN) 12.5 MG tablet Take 1 tablet (12.5 mg total) by mouth every 6 (six) hours as needed for nausea (for refractory anusea and/or vomiting). Patient not taking: Reported on 10/20/2014 02/17/13   Robbie Lis, MD  promethazine (PHENERGAN) 25 MG tablet Take 25 mg by mouth every 6 (six) hours as needed. Nausea and vomitting 10/18/13   Historical Provider, MD    Physical Exam: Filed Vitals:   10/20/14 1710 10/20/14 2021 10/21/14 0022 10/21/14 0055  BP: 131/50 122/47 146/61 152/67  Pulse: 99 102 104 110  Temp:   98.8 F (37.1 C) 98.9 F (37.2 C)  TempSrc:   Oral Oral  Resp: 16 16 18 16   Height:    5\' 4"  (1.626 m)  Weight:    62.8 kg (138 lb 7.2 oz)  SpO2: 100% 100% 100% 100%     General:  Moderately built and nourished.  Eyes: Anicteric no pallor.  ENT: No discharge from the ears eyes nose and mouth.  Neck: No mass felt.  Cardiovascular: S1 and S2 heard.  Respiratory: No rhonchi or crepitations.  Abdomen: Soft nontender bowel sounds present. No guarding or rigidity.  Skin: No rash.  Musculoskeletal: No edema.  Psychiatric: Appears normal.  Neurologic: Alert awake oriented to time place and person. Moves all extremities.  Labs on Admission:  Basic Metabolic Panel:  Recent Labs Lab 10/20/14 1455  NA 135  K 3.7  CL 98  CO2 26  GLUCOSE 170*  BUN 22  CREATININE 1.14*  CALCIUM 9.7   Liver Function Tests:  Recent Labs Lab 10/20/14 1455  AST 33  ALT 23  ALKPHOS 86  BILITOT 0.8  PROT 8.3  ALBUMIN 4.3   No results for input(s): LIPASE, AMYLASE in the last 168 hours. No results for input(s): AMMONIA in the last 168 hours. CBC:  Recent Labs Lab 10/20/14 1455  WBC 7.2  NEUTROABS 5.1  HGB 13.4  HCT 42.4  MCV 79.8  PLT 388   Cardiac Enzymes: No results for input(s): CKTOTAL, CKMB, CKMBINDEX, TROPONINI in the last 168 hours.  BNP (last 3 results) No results for input(s): BNP in the last 8760 hours.  ProBNP  (last 3 results) No results for input(s): PROBNP in the last 8760 hours.  CBG:  Recent Labs Lab 10/20/14 1346  GLUCAP 188*    Radiological Exams on Admission: No results found.   Assessment/Plan Principal Problem:   Intractable vomiting Active Problems:   Hypertension   ARF (acute renal failure)   Diabetes mellitus type 1, controlled   Nausea & vomiting   1. Nausea vomiting - suspect most likely secondary to diabetic gastroparesis. Abdomen appears benign on exam. Patient has had previous history of cholecystectomy and her LFTs are normal. Check lipase. We will check KUB. Patient states she did have a bowel movement this morning which was normal. As per the patient patient had extensive workup done 3  weeks ago at [facility] and we will try to obtain records for which I have placed a request. For now we'll continue with full liquid diet check hemoglobin A1c continue with when necessary Phenergan. Patient is intolerant to Reglan as it causes difficulty talking. 2. Diabetes mellitus type 1 controlled - patient states her blood sugar has been running fairly well at this time. Continue with her Levemir and closely follow sliding scale coverage. 3. Acute renal failure - patient's creatinine is increased from baseline from last available labs in our system. May be secondary to nausea vomiting. I will holdhis Lasix for now and continue gentle hydration and closely follow intake output and metabolic panel and respiratory status. If patient's creatinine worsen then may have to hold patient's lisinopril. 4. Hypertension - continue and is now proceeding #3. 5. History of anemia recently requiring transfusion see history of present illness.   DVT Prophylaxis Lovenox.  Code Status: Full code.  Family Communication: Patient's son at the bedside.  Disposition Plan: Admit for observation.    Anusha Claus N. [facility] Pager [phone].  If 7PM-7AM, please  contact night-coverage [email or web address] Password [facility] 10/21/2014, 12:58 AM

## 2014-10-21 NOTE — ED Notes (Signed)
Episode of N/V prior to discharge pt re-eval for admission

## 2014-10-21 NOTE — Progress Notes (Signed)
Patient refuses to take PO zofran. She vomited X1- 150cc and yellow. MD notified to see if patient could receive IV nausea medication.

## 2014-10-21 NOTE — Progress Notes (Addendum)
Patient ID: [name], female   DOB: 02-06-1966, 49 y.o.   MRN: [id number]  [facility] PROGRESS NOTE  [name] PPI:[id number] DOB: [birth year] DOA: 10/20/2014 PCP: Barbette Merino, MD  Brief narrative:    49 y.o. female with history of diabetes mellitus type 1, hypertension, recent admission to [facility] 3 weeks prior to this admission where she has had EGD and colonoscopy done and per pt unremarkable for acute findings to explain symptoms, presented to the [facility] ED with main concern of several days duration of progressively worsening abdominal cramps, intermittent in nature, 10/10 in severity when present, non radiating, associated with nausea and multiple episodes of non bloody vomiting, symptoms worse with eating or drinking fluids and with no specific alleviating symptoms.   Assessment/Plan:    Principal Problem:   Intractable vomiting - unclear etiology at this time and certainly possibly related to uncontrolled gastroparesis - will continue IVF antiemetics scheduled and as needed - continue Reglan - follow upon A1C - CT abd and pelvis requested, also plan on gastric emptying study if no findings on CT abd  Active Problems:   Constipation - add stool softeners     Hypertension - Reasonable inpatient control - Continue lisinopril   ARF (acute renal failure) - Likely prerenal in etiology secondary to vomiting and poor oral intake - Continue IV fluids - If creatinine continues trending up, consider holding lisinopril - Repeat BMP in the morning   Diabetes mellitus type 1 with complications of gastroparesis - Continue insulin Levemir, increase the dose to 16 units as recommended per diabetic educator - Add nighttime coverage as well, once oral intake improves, will add meal coverage - Follow-up on A1c  DVT prophylaxis - Lovenox SQ  Code Status: Full.  Family Communication:  plan of care discussed with the patient Disposition Plan: Not ready for discharge due  to persistent vomiting, needs CT abd/pelvis for further evaluation   IV access:  Peripheral IV  Procedures and diagnostic studies:    Dg Abd 1 View  10/21/2014  Benign appearing abdomen. Moderate stool in the nondistended colon, less prominent than on the prior CT scan of 09/23/2014.     Medical Consultants:  None  Other Consultants:  None  IAnti-Infectives:   None   Faye Ramsay, MD  [facility] Pager [phone]  If 7PM-7AM, please contact night-coverage [email or web address] Password [facility] 10/21/2014, 10:52 AM  HPI/Subjective: Pt reports persistent vomiting this am, non bloody.   Objective: Filed Vitals:   10/20/14 2021 10/21/14 0022 10/21/14 0055 10/21/14 0519  BP: 122/47 146/61 152/67 139/64  Pulse: 102 104 110 101  Temp:  98.8 F (37.1 C) 98.9 F (37.2 C) 98.4 F (36.9 C)  TempSrc:  Oral Oral Oral  Resp: 16 18 16 16   Height:   5\' 4"  (1.626 m)   Weight:   62.8 kg (138 lb 7.2 oz)   SpO2: 100% 100% 100% 100%    Intake/Output Summary (Last 24 hours) at 10/21/14 1052 Last data filed at 10/21/14 0427  Gross per 24 hour  Intake      0 ml  Output   1025 ml  Net  -1025 ml    Exam:   General:  Pt is alert, follows commands appropriately, not in acute distress  Cardiovascular: Regular rhythm, tachycardic, S1/S2, no murmurs, no rubs, no gallops  Respiratory: Clear to auscultation bilaterally, no wheezing, no crackles, no rhonchi  Abdomen: Soft, tender in epigastric area, non distended, bowel sounds present, voluntary guarding   Extremities: No edema,  pulses DP and PT palpable bilaterally  Neuro: Grossly nonfocal  Data Reviewed: Basic Metabolic Panel:  Recent Labs Lab 10/20/14 1455 10/21/14 0505  NA 135 133*  K 3.7 4.4  CL 98 97  CO2 26 22  GLUCOSE 170* 440*  BUN 22 17  CREATININE 1.14* 1.13*  CALCIUM 9.7 8.7   Liver Function Tests:  Recent Labs Lab 10/20/14 1455  AST 33  ALT 23  ALKPHOS 86  BILITOT 0.8  PROT 8.3  ALBUMIN 4.3    Recent  Labs Lab 10/21/14 0505  LIPASE 27   CBC:  Recent Labs Lab 10/20/14 1455 10/21/14 0505  WBC 7.2 9.6  NEUTROABS 5.1  --   HGB 13.4 11.5*  HCT 42.4 37.5  MCV 79.8 81.0  PLT 388 332   CBG:  Recent Labs Lab 10/20/14 1346 10/21/14 0146 10/21/14 0630  GLUCAP 188* 366* 368*   Scheduled Meds: . aspirin EC  81 mg Oral Daily  . enoxaparin  injection  40 mg Subcutaneous Q24H  . ferrous sulfate  325 mg Oral Daily  . insulin aspart  0-9 Units Subcutaneous TID WC  . insulin detemir  15 Units Subcutaneous QHS  . iohexol  25 mL Oral Q1 Hr x 2  . lisinopril  10 mg Oral q morning - 10a  . ondansetron  4 mg Oral 4 times per day  . pregabalin  150 mg Oral BID   Continuous Infusions: . sodium chloride 75 mL/hr at 10/21/14 0110

## 2014-10-21 NOTE — Progress Notes (Signed)
UR completed 

## 2014-10-21 NOTE — ED Notes (Signed)
Pt to 1300 via stretcher condition stable

## 2014-10-21 NOTE — Progress Notes (Signed)
Inpatient Diabetes Program Recommendations  AACE/ADA: New Consensus Statement on Inpatient Glycemic Control (2013)  Target Ranges:  Prepandial:   less than 140 mg/dL      Peak postprandial:   less than 180 mg/dL (1-2 hours)      Critically ill patients:  140 - 180 mg/dL   Reason for Visit: Hyperglycemia  Diabetes history: DM1 Outpatient Diabetes medications: Levemir 15 units QHS, Novolog s/s Current orders for Inpatient glycemic control: Levemir 15 units QHS, Novolog moderate tidwc  49 year old female with Type 1 DM admitted with nausea and vomiting. Hx gastroparesis.  Results for LENA, GORES (MRN [id number]) as of 10/21/2014 10:17  Ref. Range 10/20/2014 13:46 10/21/2014 01:46 10/21/2014 06:30  Glucose-Capillary Latest Ref Range: 70-99 mg/dL 188 (H) 366 (H) 368 (H)   HgbA1C pending.  Recommendations: Increase Levemir to 16 units QHS Add HS correction When diet is advanced to CHO mod med, add Novolog 3 units tidwc for meal coverage insulin since pt is Type 1.  Will follow daily. Thank you. Lorenda Peck, RD, LDN, CDE Inpatient Diabetes Coordinator [phone]

## 2014-10-21 NOTE — Progress Notes (Signed)
Pt admitted to 1340, accompanied by son. Vomited 100cc of clear, mucousy emesis shortly after arrival. Requested antiemetic and given zofran tablet as scheduled q6h. Pt was unhappy that she was not getting IV medication for vomiting, but currently does not have any ordered. I asked her to try the tablet and if it didn't stay down, I would page MD for further orders and she was agreeable. Also requested ambien to help her rest and order received. Continue to monitor. Hortencia Conradi RN

## 2014-10-22 ENCOUNTER — Observation Stay (HOSPITAL_COMMUNITY): Payer: 59

## 2014-10-22 ENCOUNTER — Inpatient Hospital Stay (HOSPITAL_COMMUNITY): Payer: 59

## 2014-10-22 DIAGNOSIS — Z7982 Long term (current) use of aspirin: Secondary | ICD-10-CM | POA: Diagnosis not present

## 2014-10-22 DIAGNOSIS — K3184 Gastroparesis: Secondary | ICD-10-CM | POA: Diagnosis present

## 2014-10-22 DIAGNOSIS — Z79899 Other long term (current) drug therapy: Secondary | ICD-10-CM | POA: Diagnosis not present

## 2014-10-22 DIAGNOSIS — Z794 Long term (current) use of insulin: Secondary | ICD-10-CM | POA: Diagnosis not present

## 2014-10-22 DIAGNOSIS — E860 Dehydration: Secondary | ICD-10-CM | POA: Diagnosis present

## 2014-10-22 DIAGNOSIS — K5900 Constipation, unspecified: Secondary | ICD-10-CM | POA: Diagnosis present

## 2014-10-22 DIAGNOSIS — R112 Nausea with vomiting, unspecified: Secondary | ICD-10-CM | POA: Diagnosis present

## 2014-10-22 DIAGNOSIS — Z833 Family history of diabetes mellitus: Secondary | ICD-10-CM | POA: Diagnosis not present

## 2014-10-22 DIAGNOSIS — K219 Gastro-esophageal reflux disease without esophagitis: Secondary | ICD-10-CM | POA: Diagnosis present

## 2014-10-22 DIAGNOSIS — E1043 Type 1 diabetes mellitus with diabetic autonomic (poly)neuropathy: Secondary | ICD-10-CM | POA: Diagnosis present

## 2014-10-22 DIAGNOSIS — G43A1 Cyclical vomiting, intractable: Secondary | ICD-10-CM | POA: Diagnosis not present

## 2014-10-22 DIAGNOSIS — I10 Essential (primary) hypertension: Secondary | ICD-10-CM | POA: Diagnosis present

## 2014-10-22 DIAGNOSIS — N179 Acute kidney failure, unspecified: Secondary | ICD-10-CM | POA: Diagnosis present

## 2014-10-22 DIAGNOSIS — Z8249 Family history of ischemic heart disease and other diseases of the circulatory system: Secondary | ICD-10-CM | POA: Diagnosis not present

## 2014-10-22 LAB — CBC
HCT: 36.9 % (ref 36.0–46.0)
Hemoglobin: 11.2 g/dL — ABNORMAL LOW (ref 12.0–15.0)
MCH: 24.6 pg — ABNORMAL LOW (ref 26.0–34.0)
MCHC: 30.4 g/dL (ref 30.0–36.0)
MCV: 80.9 fL (ref 78.0–100.0)
Platelets: 317 10*3/uL (ref 150–400)
RBC: 4.56 MIL/uL (ref 3.87–5.11)
RDW: 22.7 % — AB (ref 11.5–15.5)
WBC: 8.6 10*3/uL (ref 4.0–10.5)

## 2014-10-22 LAB — BASIC METABOLIC PANEL
ANION GAP: 9 (ref 5–15)
BUN: 12 mg/dL (ref 6–23)
CO2: 23 mmol/L (ref 19–32)
Calcium: 8.7 mg/dL (ref 8.4–10.5)
Chloride: 105 mmol/L (ref 96–112)
Creatinine, Ser: 1.05 mg/dL (ref 0.50–1.10)
GFR calc Af Amer: 72 mL/min — ABNORMAL LOW (ref >90)
GFR calc non Af Amer: 62 mL/min — ABNORMAL LOW (ref >90)
GLUCOSE: 249 mg/dL — AB (ref 70–99)
Potassium: 4.1 mmol/L (ref 3.5–5.1)
Sodium: 137 mmol/L (ref 135–145)

## 2014-10-22 LAB — GLUCOSE, CAPILLARY
Glucose-Capillary: 177 mg/dL — ABNORMAL HIGH (ref 70–99)
Glucose-Capillary: 111 mg/dL — ABNORMAL HIGH (ref 70–99)
Glucose-Capillary: 270 mg/dL — ABNORMAL HIGH (ref 70–99)
Glucose-Capillary: 270 mg/dL — ABNORMAL HIGH (ref 70–99)

## 2014-10-22 LAB — HEMOGLOBIN A1C
HEMOGLOBIN A1C: 8.1 % — AB (ref 4.8–5.6)
Mean Plasma Glucose: 186 mg/dL

## 2014-10-22 MED ORDER — SODIUM CHLORIDE 0.9 % IV SOLN
INTRAVENOUS | Status: AC
  Administered 2014-10-22 (×2): via INTRAVENOUS

## 2014-10-22 MED ORDER — TECHNETIUM TC 99M SULFUR COLLOID
2.1000 | Freq: Once | INTRAVENOUS | Status: AC | PRN
  Administered 2014-10-22: 2.1 via INTRAVENOUS

## 2014-10-22 NOTE — Care Management Note (Signed)
CARE MANAGEMENT NOTE 10/22/2014  Patient:  [name],[name]   Account Number:  [id number]  Date Initiated:  10/22/2014  Documentation initiated by:  Marney Doctor  Subjective/Objective Assessment:   49 yo admitted with Intractable nausea and vomitting. Hx of diabetes mellitus type 1, hypertension     Action/Plan:   From home with spouse   Anticipated DC Date:  10/22/2014   Anticipated DC Plan:  [place]  CM consult      Choice offered to / List presented to:             Status of service:  In process, will continue to follow Medicare Important Message given?   (If response is "NO", the following Medicare IM given date fields will be blank) Date Medicare IM given:   Medicare IM given by:   Date Additional Medicare IM given:   Additional Medicare IM given by:    Discharge Disposition:    Per UR Regulation:  Reviewed for med. necessity/level of care/duration of stay  If discussed at [place] of Stay Meetings, dates discussed:    Comments:  10/22/14 Marney Doctor RN,BSN,NCM [phone] Chart reviewed and CM following for DC needs.   Lynnell Catalan, RN 10/22/2014, 3:43 PM

## 2014-10-22 NOTE — Progress Notes (Addendum)
Patient ID: [name], female   DOB: 1965/07/08, 49 y.o.   MRN: [id number]  [facility] PROGRESS NOTE  Stephany Poorman PXT:[id number] DOB: Feb 26, 1966 DOA: 10/20/2014 PCP: Barbette Merino, MD  Brief narrative:    49 y.o. female with history of diabetes mellitus type 1, hypertension, recent admission to [facility] 3 weeks prior to this admission where she has had EGD and colonoscopy done and per pt unremarkable for acute findings to explain symptoms, presented to the [facility] ED with main concern of several days duration of progressively worsening abdominal cramps, intermittent in nature, 10/10 in severity when present, non radiating, associated with nausea and multiple episodes of non bloody vomiting, symptoms worse with eating or drinking fluids and with no specific alleviating symptoms.   Assessment/Plan:    Principal Problem:   Intractable vomiting - unclear etiology at this time and certainly possibly related to uncontrolled gastroparesis - CT abdomen unremarkable for acute etiology to explain patient's symptoms - Plan is to proceed with gastric emptying study today, we'll follow-up on results - will continue IVF antiemetics scheduled and as needed - continue Reglan - If patient feels better this afternoon, plan on advancing diet to soft and see how patient tolerating Active Problems:   Constipation - added stool softeners, still no BM - May need suppository if no BM by this afternoon   Hypertension - Reasonable inpatient control - Continue lisinopril   ARF (acute renal failure) - Likely prerenal in etiology secondary to vomiting and poor oral intake - baseline Cr <1 in 2014 - IV fluids have been provided, creatinine trending down and within normal limits this morning - Repeat BMP in the morning   Diabetes mellitus type 1 with complications of gastroparesis - Continue insulin Levemir, increased the dose to 16 units as recommended per diabetic educator - Added nighttime  coverage as well, once oral intake improves, will add meal coverage - Follow-up on A1c  DVT prophylaxis - Lovenox SQ  Code Status: Full.  Family Communication:  plan of care discussed with the patient Disposition Plan: Not ready for discharge due to persistent nausea, poor oral intake, gastric emptying study is planned for today  IV access:  Peripheral IV  Procedures and diagnostic studies:    Dg Abd 1 View  10/21/2014  Benign appearing abdomen. Moderate stool in the nondistended colon, less prominent than on the prior CT scan of 09/23/2014.     Medical Consultants:  None  Other Consultants:  None  IAnti-Infectives:   None   Faye Ramsay, MD  [facility] Pager [phone]  If 7PM-7AM, please contact night-coverage [email or web address] Password [email or web address] 10/22/2014, 11:10 AM  HPI/Subjective: Pt reports persistent nausea, still with very poor oral intake.  Objective: Filed Vitals:   10/21/14 0519 10/21/14 1430 10/21/14 2048 10/22/14 0519  BP: 139/64 130/60 137/68 109/54  Pulse: 101 100 99 96  Temp: 98.4 F (36.9 C) 98 F (36.7 C) 99.1 F (37.3 C) 98.4 F (36.9 C)  TempSrc: Oral Oral Oral Oral  Resp: 16 18 16 16   Height:      Weight:      SpO2: 100% 100% 99% 98%    Intake/Output Summary (Last 24 hours) at 10/22/14 1110 Last data filed at 10/22/14 0431  Gross per 24 hour  Intake      0 ml  Output   1400 ml  Net  -1400 ml    Exam:   General:  Pt is alert, follows commands appropriately, not in acute distress  Cardiovascular: Regular rhythm, tachycardic,  S1/S2, no murmurs, no rubs, no gallops  Respiratory: Clear to auscultation bilaterally, no wheezing, no crackles, no rhonchi  Abdomen: Soft, tender in epigastric area, non distended, bowel sounds present, no guarding  Extremities: No edema, pulses DP and PT palpable bilaterally  Neuro: Grossly nonfocal  Data Reviewed: Basic Metabolic Panel:  Recent Labs Lab 10/20/14 1455 10/21/14 0505 10/22/14 0433  NA 135  133* 137  K 3.7 4.4 4.1  CL 98 97 105  CO2 26 22 23   GLUCOSE 170* 440* 249*  BUN 22 17 12   CREATININE 1.14* 1.13* 1.05  CALCIUM 9.7 8.7 8.7   Liver Function Tests:  Recent Labs Lab 10/20/14 1455  AST 33  ALT 23  ALKPHOS 86  BILITOT 0.8  PROT 8.3  ALBUMIN 4.3    Recent Labs Lab 10/21/14 0505  LIPASE 27   CBC:  Recent Labs Lab 10/20/14 1455 10/21/14 0505 10/22/14 0433  WBC 7.2 9.6 8.6  NEUTROABS 5.1  --   --   HGB 13.4 11.5* 11.2*  HCT 42.4 37.5 36.9  MCV 79.8 81.0 80.9  PLT 388 332 317   CBG:  Recent Labs Lab 10/21/14 0630 10/21/14 1244 10/21/14 1700 10/21/14 2138 10/22/14 0754  GLUCAP 368* 157* 121* 160* 177*   Scheduled Meds: . aspirin EC  81 mg Oral Daily  . enoxaparin  injection  40 mg Subcutaneous Q24H  . ferrous sulfate  325 mg Oral Daily  . insulin aspart  0-9 Units Subcutaneous TID WC  . insulin detemir  15 Units Subcutaneous QHS  . iohexol  25 mL Oral Q1 Hr x 2  . lisinopril  10 mg Oral q morning - 10a  . ondansetron  4 mg Oral 4 times per day  . pregabalin  150 mg Oral BID   Continuous Infusions: . sodium chloride 75 mL/hr at 10/22/14 0420

## 2014-10-22 NOTE — Clinical Documentation Improvement (Signed)
Possible Clinical Conditions?    Acute Renal Failure/Acute Kidney Injury Acute Tubular Necrosis Acute Renal Cortical Necrosis Acute Renal Medullary Necrosis Acute on Chronic Renal Failure Chronic Renal Failure Other Condition Cannot Clinically Determine   Supporting Information: (As per notes) "Acute renal failure - patient's creatinine is increased from baseline from last available labs in our system."  Please indicate the baseline Creatinine that you are using  Thank You, Alessandra Grout, RN, BSN, CCDS,Clinical Documentation Specialist:  [phone]  [phone]=[facility]- Health Information Management

## 2014-10-23 ENCOUNTER — Other Ambulatory Visit (HOSPITAL_COMMUNITY): Payer: 59

## 2014-10-23 LAB — GLUCOSE, CAPILLARY
Glucose-Capillary: 104 mg/dL — ABNORMAL HIGH (ref 70–99)
Glucose-Capillary: 166 mg/dL — ABNORMAL HIGH (ref 70–99)

## 2014-10-23 LAB — CBC
HEMATOCRIT: 35.6 % — AB (ref 36.0–46.0)
Hemoglobin: 10.9 g/dL — ABNORMAL LOW (ref 12.0–15.0)
MCH: 24.9 pg — AB (ref 26.0–34.0)
MCHC: 30.6 g/dL (ref 30.0–36.0)
MCV: 81.3 fL (ref 78.0–100.0)
Platelets: 261 10*3/uL (ref 150–400)
RBC: 4.38 MIL/uL (ref 3.87–5.11)
RDW: 22.3 % — ABNORMAL HIGH (ref 11.5–15.5)
WBC: 3.9 10*3/uL — AB (ref 4.0–10.5)

## 2014-10-23 LAB — BASIC METABOLIC PANEL
ANION GAP: 7 (ref 5–15)
BUN: 12 mg/dL (ref 6–23)
CO2: 25 mmol/L (ref 19–32)
Calcium: 8.8 mg/dL (ref 8.4–10.5)
Chloride: 106 mmol/L (ref 96–112)
Creatinine, Ser: 0.88 mg/dL (ref 0.50–1.10)
GFR calc Af Amer: 89 mL/min — ABNORMAL LOW (ref >90)
GFR calc non Af Amer: 77 mL/min — ABNORMAL LOW (ref >90)
GLUCOSE: 140 mg/dL — AB (ref 70–99)
POTASSIUM: 3.7 mmol/L (ref 3.5–5.1)
Sodium: 138 mmol/L (ref 135–145)

## 2014-10-23 MED ORDER — ONDANSETRON HCL 4 MG PO TABS: 4.0000 mg | ORAL_TABLET | Freq: Three times a day (TID) | ORAL | Status: DC | PRN

## 2014-10-23 MED ORDER — ONDANSETRON 8 MG PO TBDP: 8.0000 mg | ORAL_TABLET | Freq: Four times a day (QID) | ORAL | Status: DC

## 2014-10-23 NOTE — Discharge Instructions (Signed)
1. Medications: zofran, usual home medications 2. Treatment: rest, drink plenty of fluids, advance diet slowly - liquids only for 24 hours 3. Follow Up: Please followup with your primary doctor in 2 days for discussion of your diagnoses and further evaluation after today's visit; if you do not have a primary care doctor use the resource guide provided to find one; Please return to the ER for persistent vomiting, high fevers or worsening symptoms   Gastroparesis  Gastroparesis is also called slowed stomach emptying (delayed gastric emptying). It is a condition in which the stomach takes too long to empty its contents. It often happens in people with diabetes.  CAUSES  Gastroparesis happens when nerves to the stomach are damaged or stop working. When the nerves are damaged, the muscles of the stomach and intestines do not work normally. The movement of food is slowed or stopped. High blood glucose (sugar) causes changes in nerves and can damage the blood vessels that carry oxygen and nutrients to the nerves. RISK FACTORS  Diabetes.  Post-viral syndromes.  Eating disorders (anorexia, bulimia).  Surgery on the stomach or vagus nerve.  Gastroesophageal reflux disease (rarely).  Smooth muscle disorders (amyloidosis, scleroderma).  Metabolic disorders, including hypothyroidism.  Parkinson disease. SYMPTOMS   Heartburn.  Feeling sick to your stomach (nausea).  Vomiting of undigested food.  An early feeling of fullness when eating.  Weight loss.  Abdominal bloating.  Erratic blood glucose levels.  Lack of appetite.  Gastroesophageal reflux.  Spasms of the stomach wall. Complications can include:  Bacterial overgrowth in stomach. Food stays in the stomach and can ferment and cause bacteria to grow.  Weight loss due to difficulty digesting and absorbing nutrients.  Vomiting.  Obstruction in the stomach. Undigested food can harden and cause nausea and vomiting.  Blood  glucose fluctuations caused by inconsistent food absorption. DIAGNOSIS  The diagnosis of gastroparesis is confirmed through one or more of the following tests:  Barium X-rays and scans. These tests look at how long it takes for food to move through the stomach.  Gastric manometry. This test measures electrical and muscular activity in the stomach. A thin tube is passed down the throat into the stomach. The tube contains a wire that takes measurements of the stomach's electrical and muscular activity as it digests liquids and solid food.  Endoscopy. This procedure is done with a long, thin tube called an endoscope. It is passed through the mouth and gently down the esophagus into the stomach. This tube helps the caregiver look at the lining of the stomach to check for any abnormalities.  Ultrasonography. This can rule out gallbladder disease or pancreatitis. This test will outline and define the shape of the gallbladder and pancreas. TREATMENT   Treatments may include:  Exercise.  Medicines to control nausea and vomiting.  Medicines to stimulate stomach muscles.  Changes in what and when you eat.  Having smaller meals more often.  Eating low-fiber forms of high-fiber foods, such as eating cooked vegetables instead of raw vegetables.  Eating low-fat foods.  Consuming liquids, which are easier to digest.  In severe cases, feeding tubes and intravenous (IV) feeding may be needed. It is important to note that in most cases, treatment does not cure gastroparesis. It is usually a lasting (chronic) condition. Treatment helps you manage the underlying condition so that you can be as healthy and comfortable as possible. Other treatments  A gastric neurostimulator has been developed to assist people with gastroparesis. The battery-operated device is surgically  implanted. It emits mild electrical pulses to help improve stomach emptying and to control nausea and vomiting.  The use of  botulinum toxin has been shown to improve stomach emptying by decreasing the prolonged contractions of the muscle between the stomach and the small intestine (pyloric sphincter). The benefits are temporary. SEEK MEDICAL CARE IF:   You have diabetes and you are having problems keeping your blood glucose in goal range.  You are having nausea, vomiting, bloating, or early feelings of fullness with eating.  Your symptoms do not change with a change in diet. Document Released: 06/11/2005 Document Revised: 10/06/2012 Document Reviewed: 11/18/2008 [employer] [facility] Patient Information 2015 [place], [place]. This information is not intended to replace advice given to you by your health care provider. Make sure you discuss any questions you have with your health care provider.

## 2014-10-23 NOTE — Discharge Summary (Signed)
Physician Discharge Summary  [name] FKC:[id number] DOB: March 16, 1966 DOA: 10/20/2014  PCP: Barbette Merino, MD  Admit date: 10/20/2014 Discharge date: 10/23/2014  Recommendations for Outpatient Follow-up:  1. Pt will need to follow up with PCP in 2-3 weeks post discharge 2. Please obtain BMP to evaluate electrolytes and kidney function 3. Please also check CBC to evaluate Hg and Hct levels 4. Please note that gastric emptying study with severely delayed gastric emptying and c/w severe gastroparesis, pt was provided gastroparesis specialist name and number at [facility] to call in AM to have and appointment scheduled   Discharge Diagnoses:  Principal Problem:   Intractable vomiting Active Problems:   Hypertension   ARF (acute renal failure)   Diabetes mellitus type 1, controlled   Nausea & vomiting   Nausea and vomiting  Discharge Condition: Stable  Diet recommendation: Heart healthy diet discussed in details   Brief narrative:  49 y.o. female with history of diabetes mellitus type 1, hypertension, recent admission to [facility] 3 weeks prior to this admission where she has had EGD and colonoscopy done and per pt unremarkable for acute findings to explain symptoms, presented to [facility] ED with main concern of several days duration of progressively worsening abdominal cramps, intermittent in nature, 10/10 in severity when present, non radiating, associated with nausea and multiple episodes of non bloody vomiting, symptoms worse with eating or drinking fluids and with no specific alleviating symptoms.   Assessment/Plan:    Principal Problem:  Intractable vomiting - secondary to severe gastroparesis as noted per gastric emptying study  - CT abdomen unremarkable for acute etiology to explain patient's symptoms - pt started on zofran scheduled and as needed and tolerating soft diet well - wants to go home today  - continue antiemetics scheduled and as needed - pt  unable to take Reglan due to slurred speech  Active Problems:  Constipation - added stool softeners, has had BM this AM   Hypertension - Reasonable inpatient control - Continue lisinopril  ARF (acute renal failure) - Likely prerenal in etiology secondary to vomiting and poor oral intake - baseline Cr <1 in 2014 - IV fluids have been provided, creatinine trending down and within normal limits this morning  Diabetes mellitus type 1 with complications of gastroparesis - Continue insulin as per home medical regimen upon discharge    Code Status: Full.  Family Communication: plan of care discussed with the patient Disposition Plan: Wants to go home   IV access:  Peripheral IV  Procedures and diagnostic studies:   Dg Abd 1 View 10/21/2014 Benign appearing abdomen. Moderate stool in the nondistended colon, less prominent than on the prior CT scan of 09/23/2014.   Medical Consultants:  None  Other Consultants:  None  IAnti-Infectives:   None      Discharge Exam: Filed Vitals:   10/23/14 0530  BP: 115/57  Pulse: 82  Temp: 97.4 F (36.3 C)  Resp: 16   Filed Vitals:   10/22/14 0519 10/22/14 1300 10/22/14 2305 10/23/14 0530  BP: 109/54 125/66 105/51 115/57  Pulse: 96 94 78 82  Temp: 98.4 F (36.9 C) 98.4 F (36.9 C) 98.1 F (36.7 C) 97.4 F (36.3 C)  TempSrc: Oral Oral Oral Oral  Resp: 16 16 16 16   Height:      Weight:      SpO2: 98% 98% 99% 100%    General: Pt is alert, follows commands appropriately, not in acute distress Cardiovascular: Regular rate and rhythm, S1/S2 +,  no murmurs, no rubs, no gallops Respiratory: Clear to auscultation bilaterally, no wheezing, no crackles, no rhonchi Abdominal: Soft, non tender, non distended, bowel sounds +, no guarding Extremities: no edema, no cyanosis, pulses palpable bilaterally DP and PT Neuro: Grossly nonfocal  Discharge Instructions  Discharge Instructions    Diet - low sodium heart  healthy    Complete by:  As directed      Increase activity slowly    Complete by:  As directed             Medication List    STOP taking these medications        promethazine 12.5 MG tablet  Commonly known as:  PHENERGAN     promethazine 25 MG tablet  Commonly known as:  PHENERGAN      TAKE these medications        ALPRAZolam 0.25 MG tablet  Commonly known as:  XANAX  Take 0.25 mg by mouth 2 (two) times daily.     aspirin 81 MG EC tablet  Take 1 tablet (81 mg total) by mouth daily.     ferrous sulfate 325 (65 FE) MG tablet  Take 325 mg by mouth daily.     furosemide 20 MG tablet  Commonly known as:  LASIX  Take 2 tablets (40 mg total) by mouth 2 (two) times daily.     insulin aspart 100 UNIT/ML injection  Commonly known as:  novoLOG  Inject 0-15 Units into the skin 3 (three) times daily with meals.     insulin detemir 100 UNIT/ML injection  Commonly known as:  LEVEMIR  Inject 0.15 mLs (15 Units total) into the skin at bedtime.     lisinopril 10 MG tablet  Commonly known as:  PRINIVIL,ZESTRIL  Take 10 mg by mouth every morning.     ondansetron 4 MG tablet  Commonly known as:  ZOFRAN  Take 1 tablet (4 mg total) by mouth every 8 (eight) hours as needed for nausea or vomiting.     ondansetron 8 MG disintegrating tablet  Commonly known as:  ZOFRAN ODT  Take 1 tablet (8 mg total) by mouth every 6 (six) hours. 8mg  ODT q4 hours prn nausea     pregabalin 150 MG capsule  Commonly known as:  LYRICA  Take 1 capsule (150 mg total) by mouth 2 (two) times daily.            Follow-up Information    Follow up with [facility], MD. Schedule an appointment as soon as possible for a visit in 2 days.   Specialty:  Internal Medicine   Why:  For further evaluation   Contact information:   [place] [place] [address] [phone]       Follow up with Scherrie November, MD.   Specialty:  Internal Medicine   Why:  gastroparesis specialist    Contact  information:   [address] [place] [address] [phone]        The results of significant diagnostics from this hospitalization (including imaging, microbiology, ancillary and laboratory) are listed below for reference.     Microbiology: No results found for this or any previous visit (from the past 240 hour(s)).   Labs: Basic Metabolic Panel:  Recent Labs Lab 10/20/14 1455 10/21/14 0505 10/22/14 0433 10/23/14 0444  NA 135 133* 137 138  K 3.7 4.4 4.1 3.7  CL 98 97 105 106  CO2 26 22 23 25   GLUCOSE 170* 440* 249* 140*  BUN 22 17 12 12   CREATININE 1.14* 1.13* 1.05 0.88  CALCIUM 9.7 8.7 8.7 8.8   Liver Function Tests:  Recent Labs Lab 10/20/14 1455  AST 33  ALT 23  ALKPHOS 86  BILITOT 0.8  PROT 8.3  ALBUMIN 4.3    Recent Labs Lab 10/21/14 0505  LIPASE 27   No results for input(s): AMMONIA in the last 168 hours. CBC:  Recent Labs Lab 10/20/14 1455 10/21/14 0505 10/22/14 0433 10/23/14 0444  WBC 7.2 9.6 8.6 3.9*  NEUTROABS 5.1  --   --   --   HGB 13.4 11.5* 11.2* 10.9*  HCT 42.4 37.5 36.9 35.6*  MCV 79.8 81.0 80.9 81.3  PLT 388 332 317 261    CBG:  Recent Labs Lab 10/22/14 0754 10/22/14 1343 10/22/14 1759 10/22/14 2237 10/23/14 0735  GLUCAP 177* 111* 270* 270* 104*     SIGNED: Time coordinating discharge: 30 minutes  MAGICK-Kojo Liby, MD  [facility] 10/23/2014, 8:38 AM Pager [phone]  If 7PM-7AM, please contact night-coverage [email or web address] Password [email or web address]

## 2014-10-23 NOTE — Progress Notes (Signed)
Patient discharged home, all discharge medications and instructions reviewed and questions answered.  Patient refuses wheelchair assistance to vehicle, states will ambulate.

## 2014-11-03 ENCOUNTER — Emergency Department (HOSPITAL_COMMUNITY): Payer: 59

## 2014-11-03 ENCOUNTER — Encounter (HOSPITAL_COMMUNITY): Payer: Self-pay | Admitting: *Deleted

## 2014-11-03 ENCOUNTER — Emergency Department (HOSPITAL_COMMUNITY)
Admission: EM | Admit: 2014-11-03 | Discharge: 2014-11-03 | Disposition: A | Discharge status: Home / Self Care | Payer: 59 | Attending: Emergency Medicine | Admitting: Emergency Medicine

## 2014-11-03 DIAGNOSIS — Z7982 Long term (current) use of aspirin: Secondary | ICD-10-CM | POA: Diagnosis not present

## 2014-11-03 DIAGNOSIS — J45909 Unspecified asthma, uncomplicated: Secondary | ICD-10-CM | POA: Insufficient documentation

## 2014-11-03 DIAGNOSIS — Z79899 Other long term (current) drug therapy: Secondary | ICD-10-CM | POA: Diagnosis not present

## 2014-11-03 DIAGNOSIS — D649 Anemia, unspecified: Secondary | ICD-10-CM | POA: Diagnosis not present

## 2014-11-03 DIAGNOSIS — K219 Gastro-esophageal reflux disease without esophagitis: Secondary | ICD-10-CM | POA: Diagnosis not present

## 2014-11-03 DIAGNOSIS — E1040 Type 1 diabetes mellitus with diabetic neuropathy, unspecified: Secondary | ICD-10-CM | POA: Diagnosis not present

## 2014-11-03 DIAGNOSIS — I10 Essential (primary) hypertension: Secondary | ICD-10-CM | POA: Diagnosis not present

## 2014-11-03 DIAGNOSIS — Z880 Allergy status to penicillin: Secondary | ICD-10-CM | POA: Insufficient documentation

## 2014-11-03 DIAGNOSIS — F419 Anxiety disorder, unspecified: Secondary | ICD-10-CM | POA: Diagnosis not present

## 2014-11-03 DIAGNOSIS — Z794 Long term (current) use of insulin: Secondary | ICD-10-CM | POA: Insufficient documentation

## 2014-11-03 DIAGNOSIS — R112 Nausea with vomiting, unspecified: Secondary | ICD-10-CM

## 2014-11-03 DIAGNOSIS — K5900 Constipation, unspecified: Secondary | ICD-10-CM | POA: Diagnosis not present

## 2014-11-03 DIAGNOSIS — Z9849 Cataract extraction status, unspecified eye: Secondary | ICD-10-CM | POA: Insufficient documentation

## 2014-11-03 DIAGNOSIS — R000 Tachycardia, unspecified: Secondary | ICD-10-CM | POA: Diagnosis not present

## 2014-11-03 DIAGNOSIS — K5909 Other constipation: Secondary | ICD-10-CM

## 2014-11-03 DIAGNOSIS — E109 Type 1 diabetes mellitus without complications: Secondary | ICD-10-CM

## 2014-11-03 LAB — BLOOD GAS, VENOUS
Acid-Base Excess: 0.8 mmol/L (ref 0.0–2.0)
BICARBONATE: 26 meq/L — AB (ref 20.0–24.0)
O2 Saturation: 65.5 %
PCO2 VEN: 46.7 mmHg (ref 45.0–50.0)
PO2 VEN: 37.5 mmHg (ref 30.0–45.0)
Patient temperature: 98.6
TCO2: 23.8 mmol/L (ref 0–100)
pH, Ven: 7.365 — ABNORMAL HIGH (ref 7.250–7.300)

## 2014-11-03 LAB — CBC WITH DIFFERENTIAL/PLATELET
BASOS PCT: 0 % (ref 0–1)
Basophils Absolute: 0 10*3/uL (ref 0.0–0.1)
Eosinophils Absolute: 0.4 10*3/uL (ref 0.0–0.7)
Eosinophils Relative: 5 % (ref 0–5)
HEMATOCRIT: 37 % (ref 36.0–46.0)
HEMOGLOBIN: 11.9 g/dL — AB (ref 12.0–15.0)
Lymphocytes Relative: 17 % (ref 12–46)
Lymphs Abs: 1.4 10*3/uL (ref 0.7–4.0)
MCH: 26.4 pg (ref 26.0–34.0)
MCHC: 32.2 g/dL (ref 30.0–36.0)
MCV: 82.2 fL (ref 78.0–100.0)
MONOS PCT: 10 % (ref 3–12)
Monocytes Absolute: 0.8 10*3/uL (ref 0.1–1.0)
NEUTROS ABS: 5.6 10*3/uL (ref 1.7–7.7)
Neutrophils Relative %: 68 % (ref 43–77)
PLATELETS: 293 10*3/uL (ref 150–400)
RBC: 4.5 MIL/uL (ref 3.87–5.11)
RDW: 23.6 % — ABNORMAL HIGH (ref 11.5–15.5)
WBC: 8.2 10*3/uL (ref 4.0–10.5)

## 2014-11-03 LAB — URINALYSIS, ROUTINE W REFLEX MICROSCOPIC
Bilirubin Urine: NEGATIVE
Glucose, UA: NEGATIVE mg/dL
Hgb urine dipstick: NEGATIVE
Ketones, ur: 15 mg/dL — AB
Leukocytes, UA: NEGATIVE
NITRITE: NEGATIVE
Protein, ur: NEGATIVE mg/dL
SPECIFIC GRAVITY, URINE: 1.015 (ref 1.005–1.030)
UROBILINOGEN UA: 1 mg/dL (ref 0.0–1.0)
pH: 7.5 (ref 5.0–8.0)

## 2014-11-03 LAB — RAPID URINE DRUG SCREEN, HOSP PERFORMED
Amphetamines: NOT DETECTED
BENZODIAZEPINES: NOT DETECTED
Barbiturates: NOT DETECTED
Cocaine: NOT DETECTED
OPIATES: NOT DETECTED
Tetrahydrocannabinol: NOT DETECTED

## 2014-11-03 LAB — BASIC METABOLIC PANEL
Anion gap: 11 (ref 5–15)
BUN: 16 mg/dL (ref 6–20)
CHLORIDE: 98 mmol/L — AB (ref 101–111)
CO2: 24 mmol/L (ref 22–32)
CREATININE: 1.1 mg/dL — AB (ref 0.44–1.00)
Calcium: 9 mg/dL (ref 8.9–10.3)
GFR calc Af Amer: >60 mL/min (ref >60)
GFR calc non Af Amer: 59 mL/min — ABNORMAL LOW (ref >60)
Glucose, Bld: 161 mg/dL — ABNORMAL HIGH (ref 70–99)
POTASSIUM: 4.6 mmol/L (ref 3.5–5.1)
Sodium: 133 mmol/L — ABNORMAL LOW (ref 135–145)

## 2014-11-03 MED ORDER — SODIUM CHLORIDE 0.9 % IV BOLUS (SEPSIS)
1000.0000 mL | Freq: Once | INTRAVENOUS | Status: AC
Start: 2014-11-03 — End: 2014-11-03
  Administered 2014-11-03: 1000 mL via INTRAVENOUS

## 2014-11-03 MED ORDER — ONDANSETRON 8 MG PO TBDP
8.0000 mg | ORAL_TABLET | Freq: Three times a day (TID) | ORAL | Status: DC | PRN
Start: 2014-11-03 — End: 2015-02-13

## 2014-11-03 MED ORDER — PROMETHAZINE HCL 25 MG RE SUPP: 25.0000 mg | Freq: Four times a day (QID) | RECTAL | Status: DC | PRN

## 2014-11-03 MED ORDER — LORAZEPAM 2 MG/ML IJ SOLN
1.0000 mg | Freq: Once | INTRAMUSCULAR | Status: AC
  Administered 2014-11-03: 1 mg via INTRAVENOUS
  Filled 2014-11-03: qty 1

## 2014-11-03 MED ORDER — ONDANSETRON HCL 4 MG/2ML IJ SOLN
4.0000 mg | Freq: Once | INTRAMUSCULAR | Status: AC
  Administered 2014-11-03: 4 mg via INTRAVENOUS
  Filled 2014-11-03: qty 2

## 2014-11-03 NOTE — ED Notes (Signed)
PO trial initiated.

## 2014-11-03 NOTE — ED Notes (Signed)
Lynnda Shields unsuccessful [place] IV. Anderson Malta notifying Darl Householder at present time.

## 2014-11-03 NOTE — ED Notes (Signed)
Pt reports having extreme nausea starting today. She attempted Zofran 4mg  po, sx did not subside. Pt denied vomiting and or abdominal pain. Last BM 5/09. She has  hx of gastroparesis

## 2014-11-03 NOTE — ED Provider Notes (Signed)
CSN: [id number]     Arrival date & time 11/03/14  1240 History   First MD Initiated Contact with Patient 11/03/14 1558     Chief Complaint  Patient presents with  . Nausea     (Consider location/radiation/quality/duration/timing/severity/associated sxs/prior Treatment) HPI Comments: [name] is a 49 y.o. female with a PMHx of DM1, HTN, anxiety, anemia, GERD, gastroparesis, diabetic neuropathy, and chronic constipation, who presents to the ED with complaints of nausea and vomiting that began around 12 PM which she attributes to her gastroparesis. She reports that she had 3 episodes of nonbloody nonbilious emesis consisting of undigested food from last night and this morning. Her symptoms have been unrelieved with her home Zofran. No known aggravating factors given that she has not tried anything prior to arrival. She endorses associated chronic constipation, stating that her last bowel movement was on 5/9 and was "normal". She denies any fevers, chills, chest pain, shortness of breath, abdominal pain, hematemesis, diarrhea, melena, hematochezia, obstipation, dysuria, hematuria, vaginal bleeding or discharge, numbness, tingling, weakness, rashes, myalgias, arthralgias, lightheadedness, dizziness, recent travel, sick contacts, antibiotic use, suspicious food intake, alcohol use, or NSAIDs use. She states she's compliant with her insulin, taking 15 units of Levemir at night and sliding scale short acting insulin. Her CBGs at home have been very variable, last CBG before coming was ~250. Reports remote hx of DKA.   Patient is a 49 y.o. female presenting with vomiting. The history is provided by the patient. No language interpreter was used.  Emesis Severity:  Mild Duration:  4 hours Timing:  Constant Number of daily episodes:  3x today Quality:  Stomach contents and undigested food Progression:  Unchanged Chronicity:  Recurrent Recent urination:  Normal Relieved by:  Nothing Worsened by:   Nothing tried Ineffective treatments:  Antiemetics Associated symptoms: no abdominal pain, no arthralgias, no chills, no diarrhea, no headaches and no myalgias   Risk factors: diabetes   Risk factors: no alcohol use, no sick contacts, no suspect food intake and no travel to endemic areas     Past Medical History  Diagnosis Date  . Hypertension   . Cataract   . Anxiety   . Anemia   . GERD (gastroesophageal reflux disease)   . Constipation 04/03/2012  . Complication of anesthesia   . PONV (postoperative nausea and vomiting)   . Asthma     IN CHILDHOOD  . Neuromuscular disorder     Diabetic neuropathy  . Gastroparesis   . Diabetic neuropathy   . History of blood product transfusion   . Diabetes mellitus     diagnosed age 65   Past Surgical History  Procedure Laterality Date  . Retinal detachment surgery    . Posterior vitrectomy and membrane peel-left eye  09/28/2002  . Posterior vitrectomy and membrane peel-right eye  03/16/2002  . Eye surgery    . Gailstones     Family History  Problem Relation Age of Onset  . Cystic fibrosis Mother   . Hypertension Father   . Diabetes Brother   . Hypertension Maternal Grandmother    History  Substance Use Topics  . Smoking status: Never Smoker   . Smokeless tobacco: Never Used  . Alcohol Use: Yes     Comment: occas  wine   OB History    No data available     Review of Systems  Constitutional: Negative for fever and chills.  Respiratory: Negative for shortness of breath.   Cardiovascular: Negative for chest pain.  Gastrointestinal:  Positive for nausea, vomiting and constipation (chronic, last BM 5/9). Negative for abdominal pain, diarrhea, blood in stool, anal bleeding and rectal pain.  Genitourinary: Negative for dysuria, hematuria, flank pain, vaginal bleeding and vaginal discharge.  Musculoskeletal: Negative for myalgias and arthralgias.  Skin: Negative for rash.  Allergic/Immunologic: Positive for immunocompromised state  (diabetic).  Neurological: Negative for dizziness, syncope, weakness, light-headedness, numbness and headaches.  Psychiatric/Behavioral: Negative for confusion.   10 Systems reviewed and are negative for acute change except as noted in the HPI.    Allergies  Anesthetics, amide; Penicillins; and Morphine and related  Home Medications   Prior to Admission medications   Medication Sig Start Date End Date Taking? Authorizing Provider  ALPRAZolam (XANAX) 0.25 MG tablet Take 0.25 mg by mouth 2 (two) times daily.    Historical Provider, MD  aspirin EC 81 MG EC tablet Take 1 tablet (81 mg total) by mouth daily. 11/07/12   Charlynne Cousins, MD  ferrous sulfate 325 (65 FE) MG tablet Take 325 mg by mouth daily.     Historical Provider, MD  furosemide (LASIX) 20 MG tablet Take 2 tablets (40 mg total) by mouth 2 (two) times daily. 11/07/12   Charlynne Cousins, MD  insulin aspart (NOVOLOG) 100 UNIT/ML injection Inject 0-15 Units into the skin 3 (three) times daily with meals. 11/28/12   Nishant Dhungel, MD  insulin detemir (LEVEMIR) 100 UNIT/ML injection Inject 0.15 mLs (15 Units total) into the skin at bedtime. 02/17/13   Robbie Lis, MD  lisinopril (PRINIVIL,ZESTRIL) 10 MG tablet Take 10 mg by mouth every morning.     Historical Provider, MD  ondansetron (ZOFRAN ODT) 8 MG disintegrating tablet Take 1 tablet (8 mg total) by mouth every 6 (six) hours. 8mg  ODT q4 hours prn nausea 10/23/14   Theodis Blaze, MD  ondansetron (ZOFRAN) 4 MG tablet Take 1 tablet (4 mg total) by mouth every 8 (eight) hours as needed for nausea or vomiting. 10/23/14   Theodis Blaze, MD  pregabalin (LYRICA) 150 MG capsule Take 1 capsule (150 mg total) by mouth 2 (two) times daily. 02/17/13   Robbie Lis, MD   BP 163/71 mmHg  Pulse 103  Temp(Src) 98.4 F (36.9 C) (Oral)  Resp 18  Ht 5\' 2"  (1.575 m)  Wt 143 lb (64.864 kg)  BMI 26.15 kg/m2  SpO2 99%  LMP 10/16/2014 Physical Exam  Constitutional: She is oriented to person,  place, and time. She appears well-developed and well-nourished.  Non-toxic appearance. No distress.  Afebrile, nontoxic, NAD, mildly tachycardic  HENT:  Head: Normocephalic and atraumatic.  Mouth/Throat: Oropharynx is clear and moist. Mucous membranes are dry.  Mildly dry mucous membranes  Eyes: Conjunctivae and EOM are normal. Right eye exhibits no discharge. Left eye exhibits no discharge.  Neck: Normal range of motion. Neck supple.  Cardiovascular: Regular rhythm, normal heart sounds and intact distal pulses.  Tachycardia present.  Exam reveals no gallop and no friction rub.   No murmur heard. Very mildly tachycardic  Pulmonary/Chest: Effort normal and breath sounds normal. No respiratory distress. She has no decreased breath sounds. She has no wheezes. She has no rhonchi. She has no rales.  Abdominal: Soft. Normal appearance and bowel sounds are normal. She exhibits no distension. There is no tenderness. There is no rigidity, no rebound, no guarding, no CVA tenderness, no tenderness at McBurney's point and negative Murphy's sign.  Soft, NTND, +BS throughout, no r/g/r, neg murphy's, neg mcburney's, no CVA TTP  Musculoskeletal: Normal range of motion.  Neurological: She is alert and oriented to person, place, and time. She has normal strength. No sensory deficit.  Skin: Skin is warm, dry and intact. No rash noted.  Psychiatric: She has a normal mood and affect.  Nursing note and vitals reviewed.   ED Course  Procedures (including critical care time) Labs Review Labs Reviewed  CBC WITH DIFFERENTIAL/PLATELET - Abnormal; Notable for the following:    Hemoglobin 11.9 (*)    RDW 23.6 (*)    All other components within normal limits  BLOOD GAS, VENOUS - Abnormal; Notable for the following:    pH, Ven 7.365 (*)    Bicarbonate 26.0 (*)    All other components within normal limits  URINALYSIS, ROUTINE W REFLEX MICROSCOPIC - Abnormal; Notable for the following:    Ketones, ur 15 (*)     All other components within normal limits  BASIC METABOLIC PANEL - Abnormal; Notable for the following:    Sodium 133 (*)    Chloride 98 (*)    Glucose, Bld 161 (*)    Creatinine, Ser 1.10 (*)    GFR calc non Af Amer 59 (*)    All other components within normal limits  URINE RAPID DRUG SCREEN (HOSP PERFORMED)    Imaging Review Dg Abd Acute W/chest  11/03/2014   CLINICAL DATA:  Nausea and vomiting for 1 day  EXAM: DG ABDOMEN ACUTE W/ 1V CHEST  COMPARISON:  Chest radiograph October 07, 2013 ; abdomen radiograph October 20, 2013  FINDINGS: PA chest: Lungs are clear. Heart size and pulmonary vascularity are normal. No adenopathy.  Supine and upright abdomen: There is diffuse stool throughout the colon. There is no bowel dilatation or air-fluid level suggesting obstruction. No free air. No abnormal calcifications. There are surgical clips in right upper quadrant.  IMPRESSION: Diffuse stool throughout colon. Overall bowel gas pattern unremarkable. Lungs clear.   Electronically Signed   By: Lowella Grip III M.D.   On: 11/03/2014 17:07     EKG Interpretation None      MDM   Final diagnoses:  Nausea & vomiting  Diabetes mellitus type 1, controlled  Chronic constipation    49 y.o. female here with recurrence of N/V which she attributes to gastroparesis. Recently admitted for similar symptoms. Today she has no abd pain associated with it. Given that she's diabetic, will obtain basic labs to ensure she's not in DKA. Will get U/A and UDS. Will also get AAS to ensure no obstruction. Will give ativan and zofran (pt requested zofran instead of reglan since it gives her tardive dyskinesia type symptoms). Will give fluids since pt is mildly tachycardic. Will reassess shortly.   7:52 PM U/A with mild ketones but no other signs of infection and pt without anion gap or severe hyperglycemia, CBC w/diff unremarkable, UDS clear, BMP with mildly low Na which corrects, Cl 98, Gluc 161, Cr 1.1 (similar to  prior), and VBG unremarkable. Acute abd series with diffuse stool without obstruction. Pt feeling better. Still mildly tachy after 1L, will give PO fluids to ensure she is tolerating these, and then proceed with d/c with phenergan suppositories.   8:33 PM Pt tolerating small sips. States she's afraid, but that she's not having vomiting after sips of fluids. Discussed use of zofran or phenergan as needed for nausea and f/up with PCP in 3-5 days. I explained the diagnosis and have given explicit precautions to return to the ER including for any other new or  worsening symptoms. The patient understands and accepts the medical plan as it's been dictated and I have answered their questions. Discharge instructions concerning home care and prescriptions have been given. The patient is STABLE and is discharged to home in good condition.  BP 126/58 mmHg  Pulse 106  Temp(Src) 98.3 F (36.8 C) (Oral)  Resp 18  Ht 5\' 2"  (1.575 m)  Wt 143 lb (64.864 kg)  BMI 26.15 kg/m2  SpO2 100%  LMP 10/16/2014  Meds ordered this encounter  Medications  . LORazepam (ATIVAN) injection 1 mg    Sig:   . ondansetron (ZOFRAN) injection 4 mg    Sig:   . sodium chloride 0.9 % bolus 1,000 mL    Sig:   . ondansetron (ZOFRAN ODT) 8 MG disintegrating tablet    Sig: Take 1 tablet (8 mg total) by mouth every 8 (eight) hours as needed for nausea or vomiting.    Dispense:  10 tablet    Refill:  0    Order Specific Question:  Supervising Provider    Answer:  Sabra Heck, BRIAN [[id number]]  . promethazine (PHENERGAN) 25 MG suppository    Sig: Place 1 suppository (25 mg total) rectally every 6 (six) hours as needed for nausea or vomiting.    Dispense:  12 suppository    Refill:  1    Order Specific Question:  Supervising Provider    Answer:  Noemi Chapel [[id number]]     Emmanuel Gruenhagen Camprubi-Soms, PA-C 11/03/14 2034  Wandra Arthurs, MD 11/06/14 [phone]

## 2014-11-03 NOTE — Discharge Instructions (Signed)
Use zofran or phenergan for nausea. Stay hydrated with small sips of fluid. Use miralax as needed for constipation. Follow up with your regular doctor in 3-5 days for recheck of symptoms and ongoing management. Return to the ER for changes or worsening symptoms.   Nausea and Vomiting Nausea is a sick feeling that often comes before throwing up (vomiting). Vomiting is a reflex where stomach contents come out of your mouth. Vomiting can cause severe loss of body fluids (dehydration). Children and elderly adults can become dehydrated quickly, especially if they also have diarrhea. Nausea and vomiting are symptoms of a condition or disease. It is important to find the cause of your symptoms. CAUSES   Direct irritation of the stomach lining. This irritation can result from increased acid production (gastroesophageal reflux disease), infection, food poisoning, taking certain medicines (such as nonsteroidal anti-inflammatory drugs), alcohol use, or tobacco use.  Signals from the brain.These signals could be caused by a headache, heat exposure, an inner ear disturbance, increased pressure in the brain from injury, infection, a tumor, or a concussion, pain, emotional stimulus, or metabolic problems.  An obstruction in the gastrointestinal tract (bowel obstruction).  Illnesses such as diabetes, hepatitis, gallbladder problems, appendicitis, kidney problems, cancer, sepsis, atypical symptoms of a heart attack, or eating disorders.  Medical treatments such as chemotherapy and radiation.  Receiving medicine that makes you sleep (general anesthetic) during surgery. DIAGNOSIS Your caregiver may ask for tests to be done if the problems do not improve after a few days. Tests may also be done if symptoms are severe or if the reason for the nausea and vomiting is not clear. Tests may include:  Urine tests.  Blood tests.  Stool tests.  Cultures (to look for evidence of infection).  X-rays or other imaging  studies. Test results can help your caregiver make decisions about treatment or the need for additional tests. TREATMENT You need to stay well hydrated. Drink frequently but in small amounts.You may wish to drink water, sports drinks, clear broth, or eat frozen ice pops or gelatin dessert to help stay hydrated.When you eat, eating slowly may help prevent nausea.There are also some antinausea medicines that may help prevent nausea. HOME CARE INSTRUCTIONS   Take all medicine as directed by your caregiver.  If you do not have an appetite, do not force yourself to eat. However, you must continue to drink fluids.  If you have an appetite, eat a normal diet unless your caregiver tells you differently.  Eat a variety of complex carbohydrates (rice, wheat, potatoes, bread), lean meats, yogurt, fruits, and vegetables.  Avoid high-fat foods because they are more difficult to digest.  Drink enough water and fluids to keep your urine clear or pale yellow.  If you are dehydrated, ask your caregiver for specific rehydration instructions. Signs of dehydration may include:  Severe thirst.  Dry lips and mouth.  Dizziness.  Dark urine.  Decreasing urine frequency and amount.  Confusion.  Rapid breathing or pulse. SEEK IMMEDIATE MEDICAL CARE IF:   You have blood or brown flecks (like coffee grounds) in your vomit.  You have black or bloody stools.  You have a severe headache or stiff neck.  You are confused.  You have severe abdominal pain.  You have chest pain or trouble breathing.  You do not urinate at least once every 8 hours.  You develop cold or clammy skin.  You continue to vomit for longer than 24 to 48 hours.  You have a fever. MAKE SURE  YOU:   Understand these instructions.  Will watch your condition.  Will get help right away if you are not doing well or get worse. Document Released: 06/11/2005 Document Revised: 09/03/2011 Document Reviewed:  11/08/2010 [facility] Patient Information 2015 [place], [place]. This information is not intended to replace advice given to you by your health care provider. Make sure you discuss any questions you have with your health care provider.  Constipation Constipation is when a person:  Poops (has a bowel movement) less than 3 times a week.  Has a hard time pooping.  Has poop that is dry, hard, or bigger than normal. HOME CARE   Eat foods with a lot of fiber in them. This includes fruits, vegetables, beans, and whole grains such as brown rice.  Avoid fatty foods and foods with a lot of sugar. This includes french fries, hamburgers, cookies, candy, and soda.  If you are not getting enough fiber from food, take products with added fiber in them (supplements).  Drink enough fluid to keep your pee (urine) clear or pale yellow.  Exercise on a regular basis, or as told by your doctor.  Go to the restroom when you feel like you need to poop. Do not hold it.  Only take medicine as told by your doctor. Do not take medicines that help you poop (laxatives) without talking to your doctor first. GET HELP RIGHT AWAY IF:   You have bright red blood in your poop (stool).  Your constipation lasts more than 4 days or gets worse.  You have belly (abdominal) or butt (rectal) pain.  You have thin poop (as thin as a pencil).  You lose weight, and it cannot be explained. MAKE SURE YOU:   Understand these instructions.  Will watch your condition.  Will get help right away if you are not doing well or get worse. Document Released: 11/28/2007 Document Revised: 06/16/2013 Document Reviewed: 03/23/2013 [facility] Patient Information 2015 [place], [place]. This information is not intended to replace advice given to you by your health care provider. Make sure you discuss any questions you have with your health care provider.

## 2014-11-03 NOTE — ED Notes (Signed)
This RN first attempt at IV; Leron Croak second attempt. Lynnda Shields reports will attempt via US guided post DG. Pt transferring to DG at present time.

## 2014-11-03 NOTE — ED Notes (Addendum)
Pt reports nausea onset today unrelieved by zofran; pt reports emesis 20 minutes ago and at present time; total 2 episodes.

## 2014-11-03 NOTE — ED Notes (Signed)
[name] Philipps assisting pt to nearby restroom to attempt urine sample/void.

## 2014-11-03 NOTE — ED Notes (Signed)
1 IV attempt 

## 2014-11-25 ENCOUNTER — Encounter (HOSPITAL_COMMUNITY): Payer: Self-pay | Admitting: Emergency Medicine

## 2014-11-25 ENCOUNTER — Emergency Department (HOSPITAL_COMMUNITY)
Admission: EM | Admit: 2014-11-25 | Discharge: 2014-11-26 | Disposition: A | Discharge status: Home / Self Care | Payer: 59 | Attending: Emergency Medicine | Admitting: Emergency Medicine

## 2014-11-25 DIAGNOSIS — E1140 Type 2 diabetes mellitus with diabetic neuropathy, unspecified: Secondary | ICD-10-CM | POA: Diagnosis not present

## 2014-11-25 DIAGNOSIS — I10 Essential (primary) hypertension: Secondary | ICD-10-CM | POA: Diagnosis not present

## 2014-11-25 DIAGNOSIS — Z79899 Other long term (current) drug therapy: Secondary | ICD-10-CM | POA: Insufficient documentation

## 2014-11-25 DIAGNOSIS — R112 Nausea with vomiting, unspecified: Secondary | ICD-10-CM | POA: Diagnosis present

## 2014-11-25 DIAGNOSIS — Z880 Allergy status to penicillin: Secondary | ICD-10-CM | POA: Diagnosis not present

## 2014-11-25 DIAGNOSIS — Z9849 Cataract extraction status, unspecified eye: Secondary | ICD-10-CM | POA: Diagnosis not present

## 2014-11-25 DIAGNOSIS — D649 Anemia, unspecified: Secondary | ICD-10-CM | POA: Diagnosis not present

## 2014-11-25 DIAGNOSIS — Z794 Long term (current) use of insulin: Secondary | ICD-10-CM | POA: Diagnosis not present

## 2014-11-25 DIAGNOSIS — J45909 Unspecified asthma, uncomplicated: Secondary | ICD-10-CM | POA: Insufficient documentation

## 2014-11-25 DIAGNOSIS — Z7982 Long term (current) use of aspirin: Secondary | ICD-10-CM | POA: Insufficient documentation

## 2014-11-25 DIAGNOSIS — K219 Gastro-esophageal reflux disease without esophagitis: Secondary | ICD-10-CM | POA: Diagnosis not present

## 2014-11-25 DIAGNOSIS — R5383 Other fatigue: Secondary | ICD-10-CM | POA: Diagnosis not present

## 2014-11-25 DIAGNOSIS — F419 Anxiety disorder, unspecified: Secondary | ICD-10-CM | POA: Insufficient documentation

## 2014-11-25 LAB — BASIC METABOLIC PANEL
ANION GAP: 9 (ref 5–15)
BUN: 9 mg/dL (ref 6–20)
CALCIUM: 8.9 mg/dL (ref 8.9–10.3)
CHLORIDE: 101 mmol/L (ref 101–111)
CO2: 28 mmol/L (ref 22–32)
CREATININE: 0.77 mg/dL (ref 0.44–1.00)
GFR calc Af Amer: >60 mL/min (ref >60)
GFR calc non Af Amer: >60 mL/min (ref >60)
Glucose, Bld: 119 mg/dL — ABNORMAL HIGH (ref 65–99)
POTASSIUM: 3.6 mmol/L (ref 3.5–5.1)
Sodium: 138 mmol/L (ref 135–145)

## 2014-11-25 LAB — CBC WITH DIFFERENTIAL/PLATELET
Basophils Absolute: 0 10*3/uL (ref 0.0–0.1)
Basophils Relative: 0 % (ref 0–1)
EOS ABS: 0.2 10*3/uL (ref 0.0–0.7)
Eosinophils Relative: 4 % (ref 0–5)
HCT: 32.6 % — ABNORMAL LOW (ref 36.0–46.0)
HEMOGLOBIN: 10.6 g/dL — AB (ref 12.0–15.0)
LYMPHS ABS: 1.3 10*3/uL (ref 0.7–4.0)
Lymphocytes Relative: 29 % (ref 12–46)
MCH: 27.7 pg (ref 26.0–34.0)
MCHC: 32.5 g/dL (ref 30.0–36.0)
MCV: 85.3 fL (ref 78.0–100.0)
MONO ABS: 0.7 10*3/uL (ref 0.1–1.0)
MONOS PCT: 15 % — AB (ref 3–12)
NEUTROS ABS: 2.4 10*3/uL (ref 1.7–7.7)
NEUTROS PCT: 52 % (ref 43–77)
Platelets: 441 10*3/uL — ABNORMAL HIGH (ref 150–400)
RBC: 3.82 MIL/uL — ABNORMAL LOW (ref 3.87–5.11)
RDW: 23.9 % — ABNORMAL HIGH (ref 11.5–15.5)
WBC: 4.6 10*3/uL (ref 4.0–10.5)

## 2014-11-25 MED ORDER — LORAZEPAM 2 MG/ML IJ SOLN
1.0000 mg | Freq: Once | INTRAMUSCULAR | Status: AC
  Administered 2014-11-25: 1 mg via INTRAVENOUS
  Filled 2014-11-25: qty 1

## 2014-11-25 MED ORDER — ONDANSETRON HCL 4 MG/2ML IJ SOLN: 4.0000 mg | Freq: Once | INTRAMUSCULAR | Status: DC

## 2014-11-25 MED ORDER — PROMETHAZINE HCL 25 MG/ML IJ SOLN
25.0000 mg | Freq: Once | INTRAMUSCULAR | Status: AC
  Administered 2014-11-25: 25 mg via INTRAVENOUS
  Filled 2014-11-25: qty 1

## 2014-11-25 MED ORDER — SODIUM CHLORIDE 0.9 % IV BOLUS (SEPSIS)
1000.0000 mL | Freq: Once | INTRAVENOUS | Status: AC
Start: 2014-11-25 — End: 2014-11-26
  Administered 2014-11-25: 1000 mL via INTRAVENOUS

## 2014-11-25 NOTE — ED Notes (Signed)
Bed: WA01 Expected date:  Expected time:  Means of arrival:  Comments: EMS 

## 2014-11-25 NOTE — ED Notes (Signed)
Pt presents via EMS for hyperglycemia, N/V, hx of gastroparesis. CBG in route 141. 8mg  zofran at home.   VS: 138/62, 78hr, 16resp, 98%RA.

## 2014-11-25 NOTE — ED Provider Notes (Signed)
CSN: [id number]     Arrival date & time 11/25/14  2016 History   First MD Initiated Contact with Patient 11/25/14 2022     Chief Complaint  Patient presents with  . Hyperglycemia     (Consider location/radiation/quality/duration/timing/severity/associated sxs/prior Treatment) HPI Comments: CC: Intractable vomiting  HPI: Ms. Ebrahim is a 49 year old type II insulin dependent diabetic African American female who presents to the ED with a one day history of intractable vomiting.  The patient states that this current bought of nausea/vomiting began earlier today and she has been unable to keep any food down.  She also is experiencing severe anxiety regarding her condition, and she states she had a panic attack earlier in the day.  Ms. Culliver states that she was diagnosed with gastroparesis 4 months ago and has had problems with intermittent vomiting since then.  She states that she has come to the ED multiple times in the past 4 months due to the vomiting where she has been given ativan for anxiety and zofran for nausea.  She has seen one GI specialist for her condition with no new treatment added, and is currently waiting to see another specialist at the end of June.   She states that she is compliant with all of her medications.  She reports no abdominal pain, fevers, sweats, or chills.   Past Medical History  Diagnosis Date  . Hypertension   . Cataract   . Anxiety   . Anemia   . GERD (gastroesophageal reflux disease)   . Constipation 04/03/2012  . Complication of anesthesia   . PONV (postoperative nausea and vomiting)   . Asthma     IN CHILDHOOD  . Neuromuscular disorder     Diabetic neuropathy  . Gastroparesis   . Diabetic neuropathy   . History of blood product transfusion   . Diabetes mellitus     diagnosed age 86   Past Surgical History  Procedure Laterality Date  . Retinal detachment surgery    . Posterior vitrectomy and membrane peel-left eye  09/28/2002  . Posterior vitrectomy  and membrane peel-right eye  03/16/2002  . Eye surgery    . Gailstones     Family History  Problem Relation Age of Onset  . Cystic fibrosis Mother   . Hypertension Father   . Diabetes Brother   . Hypertension Maternal Grandmother    History  Substance Use Topics  . Smoking status: Never Smoker   . Smokeless tobacco: Never Used  . Alcohol Use: Yes     Comment: occas  wine   OB History    No data available     Review of Systems  Constitutional: Positive for fatigue.  Gastrointestinal: Positive for nausea and vomiting.  Skin: Positive for pallor.      Allergies  Anesthetics, amide; Penicillins; and Morphine and related  Home Medications   Prior to Admission medications   Medication Sig Start Date End Date Taking? Authorizing Provider  acetaminophen (TYLENOL) 325 MG tablet Take 650 mg by mouth every 6 (six) hours as needed for moderate pain (cramping).    Yes Historical Provider, MD  ALPRAZolam Duanne Moron) 0.25 MG tablet Take 0.25 mg by mouth at bedtime as needed for anxiety (anxiety).    Yes Historical Provider, MD  aspirin EC 81 MG EC tablet Take 1 tablet (81 mg total) by mouth daily. 11/07/12  Yes Charlynne Cousins, MD  ferrous sulfate 325 (65 FE) MG tablet Take 325 mg by mouth daily.  Yes Historical Provider, MD  furosemide (LASIX) 20 MG tablet Take 2 tablets (40 mg total) by mouth 2 (two) times daily. 11/07/12  Yes Charlynne Cousins, MD  insulin aspart (NOVOLOG) 100 UNIT/ML injection Inject 0-15 Units into the skin 3 (three) times daily with meals. Patient taking differently: Inject 0-15 Units into the skin 3 (three) times daily with meals. 6-15 units three times daily with food.  Per sliding scale 11/28/12  Yes Nishant Dhungel, MD  insulin detemir (LEVEMIR) 100 UNIT/ML injection Inject 0.15 mLs (15 Units total) into the skin at bedtime. 02/17/13  Yes Robbie Lis, MD  lisinopril (PRINIVIL,ZESTRIL) 10 MG tablet Take 10 mg by mouth at bedtime.    Yes Historical Provider, MD   ondansetron (ZOFRAN ODT) 8 MG disintegrating tablet Take 1 tablet (8 mg total) by mouth every 8 (eight) hours as needed for nausea or vomiting. 11/03/14  Yes Mercedes Camprubi-Soms, PA-C  pregabalin (LYRICA) 150 MG capsule Take 1 capsule (150 mg total) by mouth 2 (two) times daily. 02/17/13  Yes Robbie Lis, MD  ondansetron (ZOFRAN ODT) 8 MG disintegrating tablet Take 1 tablet (8 mg total) by mouth every 6 (six) hours. 8mg  ODT q4 hours prn nausea Patient not taking: Reported on 11/03/2014 10/23/14   Theodis Blaze, MD  ondansetron (ZOFRAN) 4 MG tablet Take 1 tablet (4 mg total) by mouth every 8 (eight) hours as needed for nausea or vomiting. Patient not taking: Reported on 11/25/2014 10/23/14   Theodis Blaze, MD  promethazine (PHENERGAN) 25 MG suppository Place 1 suppository (25 mg total) rectally every 6 (six) hours as needed for nausea or vomiting. Patient not taking: Reported on 11/25/2014 11/03/14   Mercedes Camprubi-Soms, PA-C   BP 137/52 mmHg  Pulse 82  Temp(Src) 98 F (36.7 C) (Oral)  Resp 18  SpO2 99% Physical Exam  Constitutional: She is oriented to person, place, and time. She appears well-developed and well-nourished. She appears distressed.  HENT:  Head: Normocephalic and atraumatic.  Eyes: Conjunctivae and EOM are normal. Pupils are equal, round, and reactive to light.  Neck: Normal range of motion.  Cardiovascular: Normal rate, regular rhythm and normal heart sounds.  Exam reveals no gallop and no friction rub.   No murmur heard. Pulmonary/Chest: Effort normal and breath sounds normal. She has no wheezes. She has no rales. She exhibits no tenderness.  Abdominal: Soft. Bowel sounds are normal. There is no tenderness.  Musculoskeletal: Normal range of motion.  Neurological: She is alert and oriented to person, place, and time. She has normal reflexes.  Speech is goal-oriented. Moves limbs without ataxia.   Skin: Skin is warm and dry. There is pallor.  Psychiatric: She has a normal  mood and affect. Her behavior is normal.  Nursing note and vitals reviewed. Reports that she has had tachypnea earlier because of the anxiety surrounding her condition   ED Course  Procedures (including critical care time) Labs Review Labs Reviewed  CBC WITH DIFFERENTIAL/PLATELET - Abnormal; Notable for the following:    RBC 3.82 (*)    Hemoglobin 10.6 (*)    HCT 32.6 (*)    RDW 23.9 (*)    Platelets 441 (*)    Monocytes Relative 15 (*)    All other components within normal limits  BASIC METABOLIC PANEL - Abnormal; Notable for the following:    Glucose, Bld 119 (*)    All other components within normal limits    Imaging Review No results found.   EKG Interpretation  None      MDM   Final diagnoses:  Non-intractable vomiting with nausea, vomiting of unspecified type    9:01 PM Labs pending. Vitals stable and patient afebrile.   12:30 AM Labs unremarkable for acute changes. Patient reports improvement after phenergan and ativan. Patient will be discharged after fluids are done.    [address] [place], PA-C 11/26/14 [place], MD 11/26/14 [phone]

## 2014-11-26 NOTE — Discharge Instructions (Signed)
Follow up with your doctor for further evaluation and management.

## 2015-02-08 ENCOUNTER — Encounter (HOSPITAL_COMMUNITY): Payer: Self-pay | Admitting: Emergency Medicine

## 2015-02-08 ENCOUNTER — Inpatient Hospital Stay (HOSPITAL_COMMUNITY)
Admission: EM | Admit: 2015-02-08 | Discharge: 2015-02-13 | DRG: 639 | Disposition: A | Discharge status: Home / Self Care | Payer: 59 | Attending: Internal Medicine | Admitting: Internal Medicine

## 2015-02-08 DIAGNOSIS — Z8249 Family history of ischemic heart disease and other diseases of the circulatory system: Secondary | ICD-10-CM | POA: Diagnosis not present

## 2015-02-08 DIAGNOSIS — K3184 Gastroparesis: Secondary | ICD-10-CM | POA: Diagnosis present

## 2015-02-08 DIAGNOSIS — D509 Iron deficiency anemia, unspecified: Secondary | ICD-10-CM | POA: Diagnosis present

## 2015-02-08 DIAGNOSIS — E1110 Type 2 diabetes mellitus with ketoacidosis without coma: Secondary | ICD-10-CM

## 2015-02-08 DIAGNOSIS — Z888 Allergy status to other drugs, medicaments and biological substances status: Secondary | ICD-10-CM | POA: Diagnosis not present

## 2015-02-08 DIAGNOSIS — R451 Restlessness and agitation: Secondary | ICD-10-CM

## 2015-02-08 DIAGNOSIS — Z794 Long term (current) use of insulin: Secondary | ICD-10-CM

## 2015-02-08 DIAGNOSIS — G629 Polyneuropathy, unspecified: Secondary | ICD-10-CM

## 2015-02-08 DIAGNOSIS — Z833 Family history of diabetes mellitus: Secondary | ICD-10-CM

## 2015-02-08 DIAGNOSIS — E1310 Other specified diabetes mellitus with ketoacidosis without coma: Secondary | ICD-10-CM | POA: Diagnosis not present

## 2015-02-08 DIAGNOSIS — I129 Hypertensive chronic kidney disease with stage 1 through stage 4 chronic kidney disease, or unspecified chronic kidney disease: Secondary | ICD-10-CM | POA: Diagnosis present

## 2015-02-08 DIAGNOSIS — E1149 Type 2 diabetes mellitus with other diabetic neurological complication: Secondary | ICD-10-CM | POA: Diagnosis not present

## 2015-02-08 DIAGNOSIS — D631 Anemia in chronic kidney disease: Secondary | ICD-10-CM | POA: Diagnosis present

## 2015-02-08 DIAGNOSIS — F419 Anxiety disorder, unspecified: Secondary | ICD-10-CM | POA: Diagnosis present

## 2015-02-08 DIAGNOSIS — R112 Nausea with vomiting, unspecified: Secondary | ICD-10-CM | POA: Diagnosis present

## 2015-02-08 DIAGNOSIS — E1043 Type 1 diabetes mellitus with diabetic autonomic (poly)neuropathy: Secondary | ICD-10-CM | POA: Diagnosis present

## 2015-02-08 DIAGNOSIS — E876 Hypokalemia: Secondary | ICD-10-CM | POA: Diagnosis not present

## 2015-02-08 DIAGNOSIS — Z79899 Other long term (current) drug therapy: Secondary | ICD-10-CM | POA: Diagnosis not present

## 2015-02-08 DIAGNOSIS — E1065 Type 1 diabetes mellitus with hyperglycemia: Secondary | ICD-10-CM | POA: Diagnosis not present

## 2015-02-08 DIAGNOSIS — E1069 Type 1 diabetes mellitus with other specified complication: Secondary | ICD-10-CM | POA: Diagnosis present

## 2015-02-08 DIAGNOSIS — N183 Chronic kidney disease, stage 3 (moderate): Secondary | ICD-10-CM | POA: Diagnosis present

## 2015-02-08 DIAGNOSIS — K219 Gastro-esophageal reflux disease without esophagitis: Secondary | ICD-10-CM | POA: Diagnosis present

## 2015-02-08 DIAGNOSIS — E1010 Type 1 diabetes mellitus with ketoacidosis without coma: Principal | ICD-10-CM | POA: Insufficient documentation

## 2015-02-08 DIAGNOSIS — Z9049 Acquired absence of other specified parts of digestive tract: Secondary | ICD-10-CM | POA: Diagnosis present

## 2015-02-08 DIAGNOSIS — E1143 Type 2 diabetes mellitus with diabetic autonomic (poly)neuropathy: Secondary | ICD-10-CM | POA: Diagnosis not present

## 2015-02-08 DIAGNOSIS — I10 Essential (primary) hypertension: Secondary | ICD-10-CM | POA: Diagnosis not present

## 2015-02-08 DIAGNOSIS — E875 Hyperkalemia: Secondary | ICD-10-CM | POA: Diagnosis present

## 2015-02-08 DIAGNOSIS — R1084 Generalized abdominal pain: Secondary | ICD-10-CM

## 2015-02-08 LAB — COMPREHENSIVE METABOLIC PANEL
ALT: 17 U/L (ref 14–54)
AST: 23 U/L (ref 15–41)
Albumin: 3.7 g/dL (ref 3.5–5.0)
Alkaline Phosphatase: 88 U/L (ref 38–126)
Anion gap: 18 — ABNORMAL HIGH (ref 5–15)
BUN: 25 mg/dL — AB (ref 6–20)
CHLORIDE: 104 mmol/L (ref 101–111)
CO2: 12 mmol/L — ABNORMAL LOW (ref 22–32)
Calcium: 9 mg/dL (ref 8.9–10.3)
Creatinine, Ser: 1.28 mg/dL — ABNORMAL HIGH (ref 0.44–1.00)
GFR calc Af Amer: 57 mL/min — ABNORMAL LOW (ref >60)
GFR, EST NON AFRICAN AMERICAN: 49 mL/min — AB (ref >60)
Glucose, Bld: 489 mg/dL — ABNORMAL HIGH (ref 65–99)
POTASSIUM: 4.7 mmol/L (ref 3.5–5.1)
Sodium: 134 mmol/L — ABNORMAL LOW (ref 135–145)
Total Bilirubin: 1.7 mg/dL — ABNORMAL HIGH (ref 0.3–1.2)
Total Protein: 7.2 g/dL (ref 6.5–8.1)

## 2015-02-08 LAB — CBC WITH DIFFERENTIAL/PLATELET
BASOS ABS: 0 10*3/uL (ref 0.0–0.1)
BASOS PCT: 0 % (ref 0–1)
EOS ABS: 0 10*3/uL (ref 0.0–0.7)
EOS PCT: 0 % (ref 0–5)
HCT: 35.7 % — ABNORMAL LOW (ref 36.0–46.0)
Hemoglobin: 12 g/dL (ref 12.0–15.0)
Lymphocytes Relative: 6 % — ABNORMAL LOW (ref 12–46)
Lymphs Abs: 0.6 10*3/uL — ABNORMAL LOW (ref 0.7–4.0)
MCH: 30.3 pg (ref 26.0–34.0)
MCHC: 33.6 g/dL (ref 30.0–36.0)
MCV: 90.2 fL (ref 78.0–100.0)
MONO ABS: 0.2 10*3/uL (ref 0.1–1.0)
Monocytes Relative: 2 % — ABNORMAL LOW (ref 3–12)
Neutro Abs: 9.8 10*3/uL — ABNORMAL HIGH (ref 1.7–7.7)
Neutrophils Relative %: 92 % — ABNORMAL HIGH (ref 43–77)
PLATELETS: 266 10*3/uL (ref 150–400)
RBC: 3.96 MIL/uL (ref 3.87–5.11)
RDW: 13.1 % (ref 11.5–15.5)
WBC: 10.7 10*3/uL — ABNORMAL HIGH (ref 4.0–10.5)

## 2015-02-08 LAB — URINALYSIS, ROUTINE W REFLEX MICROSCOPIC
Bilirubin Urine: NEGATIVE
Hgb urine dipstick: NEGATIVE
Ketones, ur: >80 mg/dL — AB
LEUKOCYTES UA: NEGATIVE
Nitrite: NEGATIVE
PROTEIN: NEGATIVE mg/dL
Specific Gravity, Urine: 1.023 (ref 1.005–1.030)
UROBILINOGEN UA: 0.2 mg/dL (ref 0.0–1.0)
pH: 5.5 (ref 5.0–8.0)

## 2015-02-08 LAB — BLOOD GAS, ARTERIAL
Acid-base deficit: 13.4 mmol/L — ABNORMAL HIGH (ref 0.0–2.0)
BICARBONATE: 11.3 meq/L — AB (ref 20.0–24.0)
Drawn by: 422461
O2 Content: 2 L/min
O2 Saturation: 97 %
PCO2 ART: 23.3 mmHg — AB (ref 35.0–45.0)
PH ART: 7.304 — AB (ref 7.350–7.450)
PO2 ART: 109 mmHg — AB (ref 80.0–100.0)
Patient temperature: 98.2
TCO2: 10.4 mmol/L (ref 0–100)

## 2015-02-08 LAB — BASIC METABOLIC PANEL
Anion gap: 19 — ABNORMAL HIGH (ref 5–15)
BUN: 26 mg/dL — AB (ref 6–20)
CHLORIDE: 104 mmol/L (ref 101–111)
CO2: 13 mmol/L — ABNORMAL LOW (ref 22–32)
Calcium: 9.1 mg/dL (ref 8.9–10.3)
Creatinine, Ser: 1.37 mg/dL — ABNORMAL HIGH (ref 0.44–1.00)
GFR calc Af Amer: 52 mL/min — ABNORMAL LOW (ref >60)
GFR calc non Af Amer: 45 mL/min — ABNORMAL LOW (ref >60)
Glucose, Bld: 499 mg/dL — ABNORMAL HIGH (ref 65–99)
POTASSIUM: 4.6 mmol/L (ref 3.5–5.1)
SODIUM: 136 mmol/L (ref 135–145)

## 2015-02-08 LAB — I-STAT TROPONIN, ED: TROPONIN I, POC: 0 ng/mL (ref 0.00–0.08)

## 2015-02-08 LAB — URINE MICROSCOPIC-ADD ON

## 2015-02-08 LAB — I-STAT BETA HCG BLOOD, ED (MC, WL, AP ONLY)

## 2015-02-08 LAB — CBG MONITORING, ED
GLUCOSE-CAPILLARY: 416 mg/dL — AB (ref 65–99)
GLUCOSE-CAPILLARY: 341 mg/dL — AB (ref 65–99)
Glucose-Capillary: 410 mg/dL — ABNORMAL HIGH (ref 65–99)

## 2015-02-08 LAB — LIPASE, BLOOD: LIPASE: 24 U/L (ref 22–51)

## 2015-02-08 MED ORDER — SODIUM CHLORIDE 0.9 % IV SOLN
INTRAVENOUS | Status: DC
  Administered 2015-02-09: 02:00:00 via INTRAVENOUS

## 2015-02-08 MED ORDER — METOCLOPRAMIDE HCL 5 MG/ML IJ SOLN
10.0000 mg | Freq: Once | INTRAMUSCULAR | Status: AC
  Administered 2015-02-08: 10 mg via INTRAVENOUS
  Filled 2015-02-08: qty 2

## 2015-02-08 MED ORDER — SODIUM CHLORIDE 0.9 % IV BOLUS (SEPSIS)
1000.0000 mL | Freq: Once | INTRAVENOUS | Status: AC
  Administered 2015-02-08: 1000 mL via INTRAVENOUS

## 2015-02-08 MED ORDER — SODIUM CHLORIDE 0.9 % IV SOLN: INTRAVENOUS | Status: DC

## 2015-02-08 MED ORDER — PANTOPRAZOLE SODIUM 40 MG PO TBEC
40.0000 mg | DELAYED_RELEASE_TABLET | Freq: Two times a day (BID) | ORAL | Status: DC
  Administered 2015-02-09 – 2015-02-13 (×8): 40 mg via ORAL
  Filled 2015-02-08 (×8): qty 1

## 2015-02-08 MED ORDER — SODIUM CHLORIDE 0.9 % IV SOLN
INTRAVENOUS | Status: DC
  Administered 2015-02-08: 22:00:00 via INTRAVENOUS
  Filled 2015-02-08 (×2): qty 1000

## 2015-02-08 MED ORDER — ONDANSETRON HCL 4 MG/2ML IJ SOLN
4.0000 mg | Freq: Once | INTRAMUSCULAR | Status: AC
  Administered 2015-02-09: 4 mg via INTRAVENOUS
  Filled 2015-02-08: qty 2

## 2015-02-08 MED ORDER — LORAZEPAM 2 MG/ML IJ SOLN
1.0000 mg | Freq: Once | INTRAMUSCULAR | Status: AC
Start: 2015-02-08 — End: 2015-02-08
  Administered 2015-02-08: 1 mg via INTRAVENOUS
  Filled 2015-02-08: qty 1

## 2015-02-08 MED ORDER — DEXTROSE-NACL 5-0.45 % IV SOLN
INTRAVENOUS | Status: DC
  Administered 2015-02-09 (×2): via INTRAVENOUS

## 2015-02-08 MED ORDER — LORAZEPAM 2 MG/ML IJ SOLN
1.0000 mg | Freq: Once | INTRAMUSCULAR | Status: AC
  Administered 2015-02-08: 1 mg via INTRAVENOUS
  Filled 2015-02-08: qty 1

## 2015-02-08 MED ORDER — PROMETHAZINE HCL 25 MG/ML IJ SOLN
12.5000 mg | Freq: Once | INTRAMUSCULAR | Status: AC
  Administered 2015-02-08: 12.5 mg via INTRAVENOUS
  Filled 2015-02-08: qty 1

## 2015-02-08 MED ORDER — LORAZEPAM 2 MG/ML IJ SOLN
0.5000 mg | Freq: Once | INTRAMUSCULAR | Status: AC
Start: 2015-02-08 — End: 2015-02-08
  Administered 2015-02-08: 0.5 mg via INTRAVENOUS
  Filled 2015-02-08: qty 1

## 2015-02-08 MED ORDER — POTASSIUM CHLORIDE 10 MEQ/100ML IV SOLN
10.0000 meq | INTRAVENOUS | Status: AC
  Filled 2015-02-08: qty 100

## 2015-02-08 MED ORDER — SODIUM CHLORIDE 0.9 % IV SOLN: Freq: Once | INTRAVENOUS | Status: DC

## 2015-02-08 MED ORDER — DEXTROSE-NACL 5-0.45 % IV SOLN: INTRAVENOUS | Status: DC

## 2015-02-08 MED ORDER — LORAZEPAM 0.5 MG PO TABS
0.5000 mg | ORAL_TABLET | Freq: Three times a day (TID) | ORAL | Status: DC | PRN
  Administered 2015-02-09 – 2015-02-12 (×7): 0.5 mg via ORAL
  Filled 2015-02-08 (×7): qty 1

## 2015-02-08 MED ORDER — SODIUM CHLORIDE 0.9 % IV SOLN
INTRAVENOUS | Status: DC
  Administered 2015-02-08: 3.6 [IU]/h via INTRAVENOUS
  Filled 2015-02-08: qty 2.5

## 2015-02-08 MED ORDER — HEPARIN SODIUM (PORCINE) 5000 UNIT/ML IJ SOLN
5000.0000 [IU] | Freq: Three times a day (TID) | INTRAMUSCULAR | Status: DC
  Administered 2015-02-09 – 2015-02-11 (×9): 5000 [IU] via SUBCUTANEOUS
  Filled 2015-02-08 (×14): qty 1

## 2015-02-08 MED ORDER — SODIUM CHLORIDE 0.9 % IV SOLN
250.0000 mg | Freq: Four times a day (QID) | INTRAVENOUS | Status: DC
  Administered 2015-02-09 – 2015-02-11 (×7): 250 mg via INTRAVENOUS
  Filled 2015-02-08 (×15): qty 5

## 2015-02-08 MED ORDER — FERROUS SULFATE 325 (65 FE) MG PO TABS
325.0000 mg | ORAL_TABLET | Freq: Every day | ORAL | Status: DC
  Administered 2015-02-10 – 2015-02-13 (×4): 325 mg via ORAL
  Filled 2015-02-08 (×5): qty 1

## 2015-02-08 MED ORDER — PREGABALIN 75 MG PO CAPS
150.0000 mg | ORAL_CAPSULE | Freq: Two times a day (BID) | ORAL | Status: DC
  Administered 2015-02-09 – 2015-02-13 (×9): 150 mg via ORAL
  Filled 2015-02-08 (×7): qty 2
  Filled 2015-02-08: qty 3
  Filled 2015-02-08: qty 2

## 2015-02-08 MED ORDER — PANTOPRAZOLE SODIUM 40 MG IV SOLR
40.0000 mg | Freq: Once | INTRAVENOUS | Status: AC
  Administered 2015-02-09: 40 mg via INTRAVENOUS
  Filled 2015-02-08: qty 40

## 2015-02-08 MED ORDER — DRONABINOL 2.5 MG PO CAPS
10.0000 mg | ORAL_CAPSULE | Freq: Two times a day (BID) | ORAL | Status: DC
  Administered 2015-02-09 – 2015-02-13 (×9): 10 mg via ORAL
  Filled 2015-02-08 (×2): qty 4
  Filled 2015-02-08 (×2): qty 2
  Filled 2015-02-08: qty 4
  Filled 2015-02-08: qty 2
  Filled 2015-02-08 (×4): qty 4

## 2015-02-08 NOTE — ED Notes (Signed)
Pt's son Kasandra Knudsen [phone].

## 2015-02-08 NOTE — ED Provider Notes (Signed)
CSN: [id number]     Arrival date & time 02/08/15  1535 History   First MD Initiated Contact with Patient 02/08/15 1610     Chief Complaint  Patient presents with  . Hyperglycemia  . Nausea  . Abdominal Pain     (Consider location/radiation/quality/duration/timing/severity/associated sxs/prior Treatment) HPI 49 year old female who presents with nausea and vomiting. History of DM c/b diabetic neuropathy and recurrent gastroparesis. No prior abdominal surgeries. Reports one day history of intractable non-bloody nonbilious emesis consistent with previous episodes of gastroparesis. Has not been tolerating PO and associated with epigastric pain. Denies fever, chills, diarrhea, chest pain, difficulty breathing, URI symptoms. States that these symptoms of typical of those of her gastroparesis.   Past Medical History  Diagnosis Date  . Hypertension   . Cataract   . Anxiety   . Anemia   . GERD (gastroesophageal reflux disease)   . Constipation 04/03/2012  . Complication of anesthesia   . PONV (postoperative nausea and vomiting)   . Asthma     IN CHILDHOOD  . Neuromuscular disorder     Diabetic neuropathy  . Gastroparesis   . Diabetic neuropathy   . History of blood product transfusion   . Diabetes mellitus     diagnosed age 11   Past Surgical History  Procedure Laterality Date  . Retinal detachment surgery    . Posterior vitrectomy and membrane peel-left eye  09/28/2002  . Posterior vitrectomy and membrane peel-right eye  03/16/2002  . Eye surgery    . Gailstones     Family History  Problem Relation Age of Onset  . Cystic fibrosis Mother   . Hypertension Father   . Diabetes Brother   . Hypertension Maternal Grandmother    Social History  Substance Use Topics  . Smoking status: Never Smoker   . Smokeless tobacco: Never Used  . Alcohol Use: Yes     Comment: occas  wine   OB History    No data available     Review of Systems  10/14 systems reviewed and are negative other  than those stated in the HPI   Allergies  Anesthetics, amide; Penicillins; Buprenorphine hcl; Encainide; Metoclopramide; and Morphine and related  Home Medications   Prior to Admission medications   Medication Sig Start Date End Date Taking? Authorizing Provider  acetaminophen (TYLENOL) 325 MG tablet Take 650 mg by mouth every 6 (six) hours as needed for moderate pain (cramping).    Yes Historical Provider, MD  doxycycline (VIBRAMYCIN) 100 MG capsule Take 1 capsule by mouth 2 (two) times daily. 02/08/15 02/15/15 Yes Historical Provider, MD  dronabinol (MARINOL) 10 MG capsule Take 10 mg by mouth 2 (two) times daily. 02/04/15  Yes Historical Provider, MD  furosemide (LASIX) 20 MG tablet Take 2 tablets (40 mg total) by mouth 2 (two) times daily. 11/07/12  Yes Charlynne Cousins, MD  insulin aspart (NOVOLOG) 100 UNIT/ML injection Inject 0-15 Units into the skin 3 (three) times daily with meals. Patient taking differently: Inject 0-15 Units into the skin 3 (three) times daily with meals. 6-15 units three times daily with food.  Per sliding scale 11/28/12  Yes Nishant Dhungel, MD  insulin detemir (LEVEMIR) 100 UNIT/ML injection Inject 0.15 mLs (15 Units total) into the skin at bedtime. 02/17/13  Yes Robbie Lis, MD  lisinopril (PRINIVIL,ZESTRIL) 10 MG tablet Take 10 mg by mouth at bedtime.    Yes Historical Provider, MD  LORazepam (ATIVAN) 0.5 MG tablet Take 1 tablet by mouth  3 (three) times daily as needed for anxiety.  11/29/14  Yes Historical Provider, MD  pantoprazole (PROTONIX) 40 MG tablet Take 40 mg by mouth 2 (two) times daily. 01/04/15  Yes Historical Provider, MD  pregabalin (LYRICA) 150 MG capsule Take 1 capsule (150 mg total) by mouth 2 (two) times daily. 02/17/13  Yes Robbie Lis, MD  aspirin EC 81 MG EC tablet Take 1 tablet (81 mg total) by mouth daily. Patient not taking: Reported on 02/08/2015 11/07/12   Charlynne Cousins, MD  ferrous sulfate 325 (65 FE) MG tablet Take 325 mg by mouth  daily.     Historical Provider, MD  ondansetron (ZOFRAN ODT) 8 MG disintegrating tablet Take 1 tablet (8 mg total) by mouth every 6 (six) hours. 8mg  ODT q4 hours prn nausea Patient not taking: Reported on 11/03/2014 10/23/14   Theodis Blaze, MD  ondansetron (ZOFRAN ODT) 8 MG disintegrating tablet Take 1 tablet (8 mg total) by mouth every 8 (eight) hours as needed for nausea or vomiting. Patient not taking: Reported on 02/08/2015 11/03/14   Mercedes Camprubi-Soms, PA-C  ondansetron (ZOFRAN) 4 MG tablet Take 1 tablet (4 mg total) by mouth every 8 (eight) hours as needed for nausea or vomiting. Patient not taking: Reported on 11/25/2014 10/23/14   Theodis Blaze, MD  promethazine (PHENERGAN) 25 MG suppository Place 1 suppository (25 mg total) rectally every 6 (six) hours as needed for nausea or vomiting. Patient not taking: Reported on 11/25/2014 11/03/14   Mercedes Camprubi-Soms, PA-C   BP 148/60 mmHg  Pulse 118  Temp(Src) 98.2 F (36.8 C) (Oral)  Resp 20  SpO2 95%  LMP 01/15/2015 Physical Exam  Physical Exam  Nursing note and vitals reviewed. Constitutional: Well developed, well nourished, non-toxic, actively throwing up saliva into bucket Head: Normocephalic and atraumatic.  Mouth/Throat: Oropharynx is clear. Dry mucous membranes..  Neck: Normal range of motion. Neck supple.  Cardiovascular: Tachycardic rate and regular rhythm.  No edema.  Pulmonary/Chest: Effort normal and breath sounds normal.  Abdominal: Soft. Non-distended. There is minimal epigastric tenderness. There is no rebound and no guarding.  Musculoskeletal: Normal range of motion.  Neurological: Alert, no facial droop, fluent speech, moves all extremities symmetrically Skin: Skin is warm and dry.  Psychiatric: Cooperative  ED Course  ARTERIAL BLOOD GAS Date/Time: 02/09/2015 1:36 AM Performed by: Brantley Stage DUO Authorized by: Brantley Stage DUO Consent: Verbal consent obtained. Risks and benefits: risks, benefits and alternatives  were discussed Consent given by: patient Patient understanding: patient states understanding of the procedure being performed Required items: required blood products, implants, devices, and special equipment available Patient identity confirmed: verbally with patient Time out: Immediately prior to procedure a "time out" was called to verify the correct patient, procedure, equipment, support staff and site/side marked as required. Location: left radial Preparation: Patient was prepped and draped in the usual sterile fashion. Number of attempts: 1 Method: Single percutaneous needle puncture Manual pressure: manual pressure applied for more than 5 minutes Post-procedure: dressing applied Post-procedure CMS: normal Patient tolerance: Patient tolerated the procedure well with no immediate complications   (including critical care time) Labs Review Labs Reviewed  COMPREHENSIVE METABOLIC PANEL - Abnormal; Notable for the following:    Sodium 134 (*)    CO2 12 (*)    Glucose, Bld 489 (*)    BUN 25 (*)    Creatinine, Ser 1.28 (*)    Total Bilirubin 1.7 (*)    GFR calc non Af Amer 49 (*)  GFR calc Af Amer 57 (*)    Anion gap 18 (*)    All other components within normal limits  URINALYSIS, ROUTINE W REFLEX MICROSCOPIC (NOT AT [facility]) - Abnormal; Notable for the following:    Glucose, UA >1000 (*)    Ketones, ur >80 (*)    All other components within normal limits  CBC WITH DIFFERENTIAL/PLATELET - Abnormal; Notable for the following:    WBC 10.7 (*)    HCT 35.7 (*)    Neutrophils Relative % 92 (*)    Neutro Abs 9.8 (*)    Lymphocytes Relative 6 (*)    Lymphs Abs 0.6 (*)    Monocytes Relative 2 (*)    All other components within normal limits  BLOOD GAS, ARTERIAL - Abnormal; Notable for the following:    pH, Arterial 7.304 (*)    pCO2 arterial 23.3 (*)    pO2, Arterial 109 (*)    Bicarbonate 11.3 (*)    Acid-base deficit 13.4 (*)    All other components within normal limits  BASIC  METABOLIC PANEL - Abnormal; Notable for the following:    CO2 13 (*)    Glucose, Bld 499 (*)    BUN 26 (*)    Creatinine, Ser 1.37 (*)    GFR calc non Af Amer 45 (*)    GFR calc Af Amer 52 (*)    Anion gap 19 (*)    All other components within normal limits  CBG MONITORING, ED - Abnormal; Notable for the following:    Glucose-Capillary 410 (*)    All other components within normal limits  CBG MONITORING, ED - Abnormal; Notable for the following:    Glucose-Capillary 416 (*)    All other components within normal limits  CBG MONITORING, ED - Abnormal; Notable for the following:    Glucose-Capillary 341 (*)    All other components within normal limits  CBG MONITORING, ED - Abnormal; Notable for the following:    Glucose-Capillary 296 (*)    All other components within normal limits  LIPASE, BLOOD  URINE MICROSCOPIC-ADD ON  PREGNANCY, URINE  BASIC METABOLIC PANEL  BASIC METABOLIC PANEL  BASIC METABOLIC PANEL  BASIC METABOLIC PANEL  TROPONIN I  BETA-HYDROXYBUTYRIC ACID  I-STAT BETA HCG BLOOD, ED ([facility], [facility], [facility] ONLY)  I-STAT TROPOININ, ED   I have personally reviewed and evaluated these  lab results as part of my medical decision-making.   EKG Interpretation   Date/Time:  Tuesday February 08 2015 16:58:04 EDT Ventricular Rate:  110 PR Interval:  119 QRS Duration: 99 QT Interval:  344 QTC Calculation: 465 R Axis:   -32 Text Interpretation:  Sinus tachycardia Left axis deviation No significant  change since last tracing Confirmed by Blaklee Shores MD, Hinton Dyer ([id number]) on 02/08/2015  5:08:55 PM      MDM   Final diagnoses:  Diabetic ketoacidosis without coma associated with type 2 diabetes mellitus    In short, this is 49 year old female with history of DM c/b gastroparesis who presents with intractable nausea and vomiting. Tachycardia and appears dry, but normotensive and in no respiratory distress. Treated initially as gastroparesis with IVF, antiemetics and ativan as patient reports this  has helped her symptoms in the past. Patient has poor IV access and there was significant delay in placed IV and obtaining blood work. Arterial blood draw was ultimately performed by me for blood work. Results showing evidence of DKA with  AG of 18, glucose 400s, bicarbonate 12, and ketonuria.  Initial pH 7.3 c/w mild DKA Initially received 2 L of IVF, and then placed on NS + 20 mEq KCl at 150 cc/hr. Placed on insulin drip. Discussed with triad hospitalist and admitted to stepdown for ongoing management.     Forde Dandy, MD 02/09/15 [phone]

## 2015-02-08 NOTE — ED Notes (Signed)
Patient placed on the bedpan. Patient stated she had to be on it a certain way. Attempted several times to appease the patient. Patient instructed that the pan may be uncomfortable. Patient placed on the pan and patient instructed to call the nurse when finished.

## 2015-02-08 NOTE — ED Notes (Signed)
Per EMS pt from home with hx of gastroparesis and diabetes. EMS called out yesterday for low blood sugar and today CBG 399. Pt has stomach pain and vomiting x the last 2 hours. BP 140/90, HR 96. Sats 100%.

## 2015-02-08 NOTE — ED Notes (Signed)
EDP notified that the IV Team was unable to obtain blood with IV start.

## 2015-02-08 NOTE — ED Notes (Signed)
I attempted to collect patient labs and was unsuccessful.

## 2015-02-08 NOTE — H&P (Signed)
[facility] History and Physical  Patient: [name]  MRN: [id number]  DOB: 07-10-1965  DOS: the patient was seen and examined on 02/08/2015 PCP: Barbette Merino, MD  Referring physician: dr Oleta Mouse Chief Complaint: Nausea vomiting  HPI: [name] is a 49 y.o. female with Past medical history of type 1 diabetes mellitus, diabetic gastropathy with neuropathy, hypertension, GERD, anxiety. The patient is presenting with complaints of multiple episodes of nausea and vomiting ongoing since last 2 days. She denies any blood in her vomit. She denies any constipation denies any fever or chills but she complains of abdominal pain which is diffuse. She feels her symptoms are similar to her prior episodes of gastroparesis flareup. She has not been able to get any of her insulin as well. She denies any burning urination. No follow trauma no injury no focal deficit.  The patient is coming from home.  And is independent for most of her ADL manages her medication on her own.  Review of Systems: as mentioned in the history of present illness.  A comprehensive review of the other systems is negative.  Past Medical History  Diagnosis Date  . Hypertension   . Cataract   . Anxiety   . Anemia   . GERD (gastroesophageal reflux disease)   . Constipation 04/03/2012  . Complication of anesthesia   . PONV (postoperative nausea and vomiting)   . Asthma     IN CHILDHOOD  . Neuromuscular disorder     Diabetic neuropathy  . Gastroparesis   . Diabetic neuropathy   . History of blood product transfusion   . Diabetes mellitus     diagnosed age 15   Past Surgical History  Procedure Laterality Date  . Retinal detachment surgery    . Posterior vitrectomy and membrane peel-left eye  09/28/2002  . Posterior vitrectomy and membrane peel-right eye  03/16/2002  . Eye surgery    . Gailstones     Social History:  reports that she has never smoked. She has never used smokeless tobacco. She reports that  she drinks alcohol. She reports that she does not use illicit drugs.  Allergies  Allergen Reactions  . Anesthetics, Amide Nausea And Vomiting  . Penicillins Nausea And Vomiting  . Buprenorphine Hcl Rash  . Encainide Nausea And Vomiting  . Metoclopramide Rash  . Morphine And Related Rash    Family History  Problem Relation Age of Onset  . Cystic fibrosis Mother   . Hypertension Father   . Diabetes Brother   . Hypertension Maternal Grandmother     Prior to Admission medications   Medication Sig Start Date End Date Taking? Authorizing Provider  acetaminophen (TYLENOL) 325 MG tablet Take 650 mg by mouth every 6 (six) hours as needed for moderate pain (cramping).    Yes Historical Provider, MD  doxycycline (VIBRAMYCIN) 100 MG capsule Take 1 capsule by mouth 2 (two) times daily. 02/08/15 02/15/15 Yes Historical Provider, MD  dronabinol (MARINOL) 10 MG capsule Take 10 mg by mouth 2 (two) times daily. 02/04/15  Yes Historical Provider, MD  furosemide (LASIX) 20 MG tablet Take 2 tablets (40 mg total) by mouth 2 (two) times daily. 11/07/12  Yes Charlynne Cousins, MD  insulin aspart (NOVOLOG) 100 UNIT/ML injection Inject 0-15 Units into the skin 3 (three) times daily with meals. Patient taking differently: Inject 0-15 Units into the skin 3 (three) times daily with meals. 6-15 units three times daily with food.  Per sliding scale 11/28/12  Yes Louellen Molder, MD  insulin detemir (LEVEMIR) 100 UNIT/ML injection Inject 0.15 mLs (15 Units total) into the skin at bedtime. 02/17/13  Yes Robbie Lis, MD  lisinopril (PRINIVIL,ZESTRIL) 10 MG tablet Take 10 mg by mouth at bedtime.    Yes Historical Provider, MD  LORazepam (ATIVAN) 0.5 MG tablet Take 1 tablet by mouth 3 (three) times daily as needed for anxiety.  11/29/14  Yes Historical Provider, MD  pantoprazole (PROTONIX) 40 MG tablet Take 40 mg by mouth 2 (two) times daily. 01/04/15  Yes Historical Provider, MD  pregabalin (LYRICA) 150 MG capsule Take 1  capsule (150 mg total) by mouth 2 (two) times daily. 02/17/13  Yes Robbie Lis, MD  aspirin EC 81 MG EC tablet Take 1 tablet (81 mg total) by mouth daily. Patient not taking: Reported on 02/08/2015 11/07/12   Charlynne Cousins, MD  ferrous sulfate 325 (65 FE) MG tablet Take 325 mg by mouth daily.     Historical Provider, MD  ondansetron (ZOFRAN ODT) 8 MG disintegrating tablet Take 1 tablet (8 mg total) by mouth every 6 (six) hours. 8mg  ODT q4 hours prn nausea Patient not taking: Reported on 11/03/2014 10/23/14   Theodis Blaze, MD  ondansetron (ZOFRAN ODT) 8 MG disintegrating tablet Take 1 tablet (8 mg total) by mouth every 8 (eight) hours as needed for nausea or vomiting. Patient not taking: Reported on 02/08/2015 11/03/14   Mercedes Camprubi-Soms, PA-C  ondansetron (ZOFRAN) 4 MG tablet Take 1 tablet (4 mg total) by mouth every 8 (eight) hours as needed for nausea or vomiting. Patient not taking: Reported on 11/25/2014 10/23/14   Theodis Blaze, MD  promethazine (PHENERGAN) 25 MG suppository Place 1 suppository (25 mg total) rectally every 6 (six) hours as needed for nausea or vomiting. Patient not taking: Reported on 11/25/2014 11/03/14   Zacarias Pontes, PA-C    Physical Exam: Filed Vitals:   02/08/15 1550 02/08/15 1800 02/08/15 1830 02/08/15 2118  BP: 164/69 149/43 91/50 134/44  Pulse: 106 114 110 116  Temp: 98.2 F (36.8 C)     TempSrc: Oral     Resp: 20 13 38 22  SpO2: 100% 98% 100% 100%    General: Alert, Awake and Oriented to Time, Place and Person. Appear in mild distress Eyes: PERRL ENT: Oral Mucosa clear dry. Neck: no JVD Cardiovascular: S1 and S2 Present, no Murmur, Peripheral Pulses Present Respiratory: Bilateral Air entry equal and Decreased,  Clear to Auscultation, no Crackles, no wheezes Abdomen: Bowel Sound present, Soft and diffuse mild tenderness Skin: no Rash Extremities: no Pedal edema, no calf tenderness Neurologic: Grossly no focal neuro deficit. Other than  agitation  Labs on Admission:  CBC:  Recent Labs Lab 02/08/15 1908  WBC 10.7*  NEUTROABS 9.8*  HGB 12.0  HCT 35.7*  MCV 90.2  PLT 266    CMP     Component Value Date/Time   NA 136 02/08/2015 2205   K 4.6 02/08/2015 2205   CL 104 02/08/2015 2205   CO2 13* 02/08/2015 2205   GLUCOSE 499* 02/08/2015 2205   BUN 26* 02/08/2015 2205   CREATININE 1.37* 02/08/2015 2205   CALCIUM 9.1 02/08/2015 2205   PROT 7.2 02/08/2015 1908   ALBUMIN 3.7 02/08/2015 1908   AST 23 02/08/2015 1908   ALT 17 02/08/2015 1908   ALKPHOS 88 02/08/2015 1908   BILITOT 1.7* 02/08/2015 1908   GFRNONAA 45* 02/08/2015 2205   GFRAA 52* 02/08/2015 2205     Recent Labs Lab 02/08/15 1908  LIPASE 24    No results for input(s): CKTOTAL, CKMB, CKMBINDEX, TROPONINI in the last 168 hours. BNP (last 3 results) No results for input(s): BNP in the last 8760 hours.  ProBNP (last 3 results) No results for input(s): PROBNP in the last 8760 hours.   Radiological Exams on Admission: No results found. Assessment/Plan Principal Problem:   DKA, type 1 Active Problems:   Hypertension   Nausea and vomiting   Agitation   1. DKA, type 1 The patient is presenting with complaints of nausea and vomiting she is found to be having an anion gap with acidosis. Most likely consistent with DKA. She will be placed on insulin drip with glucose stabilizer. She has received IV fluid bolus and I would continue her on IV fluids and replace her potassium. Next and monitor BMP. She'll be admitted in stepdown unit. She remains nothing by mouth until anion gap closure.  2. Diabetic gastroparesis. Placing her on erythromycin. She has developed reactions to Reglan. Monitor closely.  3. Agitation and anxiety. When necessary lorazepam.  4. Diabetes neuropathy. Continue Lyrica.  5. GERD. Continuing PPI twice a day.  Advance goals of care discussion: Full code   DVT Prophylaxis: subcutaneous Heparin Nutrition:  Nothing by mouth  Disposition: Admitted as inpatient, step-down unit.  Author: Berle Mull, MD [facility] Pager: [phone] 02/08/2015  If 7PM-7AM, please contact night-coverage [email or web address] Password [email or web address]

## 2015-02-09 ENCOUNTER — Encounter (HOSPITAL_COMMUNITY): Payer: Self-pay | Admitting: *Deleted

## 2015-02-09 DIAGNOSIS — N183 Chronic kidney disease, stage 3 (moderate): Secondary | ICD-10-CM

## 2015-02-09 DIAGNOSIS — R112 Nausea with vomiting, unspecified: Secondary | ICD-10-CM

## 2015-02-09 DIAGNOSIS — E1143 Type 2 diabetes mellitus with diabetic autonomic (poly)neuropathy: Secondary | ICD-10-CM

## 2015-02-09 DIAGNOSIS — K3184 Gastroparesis: Secondary | ICD-10-CM

## 2015-02-09 DIAGNOSIS — E1149 Type 2 diabetes mellitus with other diabetic neurological complication: Secondary | ICD-10-CM

## 2015-02-09 LAB — BASIC METABOLIC PANEL
ANION GAP: 17 — AB (ref 5–15)
ANION GAP: 11 (ref 5–15)
Anion gap: 18 — ABNORMAL HIGH (ref 5–15)
Anion gap: 20 — ABNORMAL HIGH (ref 5–15)
Anion gap: 11 (ref 5–15)
Anion gap: 9 (ref 5–15)
BUN: 21 mg/dL — ABNORMAL HIGH (ref 6–20)
BUN: 21 mg/dL — ABNORMAL HIGH (ref 6–20)
BUN: 15 mg/dL (ref 6–20)
BUN: 23 mg/dL — AB (ref 6–20)
BUN: 23 mg/dL — AB (ref 6–20)
BUN: 13 mg/dL (ref 6–20)
CHLORIDE: 111 mmol/L (ref 101–111)
CHLORIDE: 112 mmol/L — AB (ref 101–111)
CHLORIDE: 107 mmol/L (ref 101–111)
CHLORIDE: 108 mmol/L (ref 101–111)
CHLORIDE: 111 mmol/L (ref 101–111)
CHLORIDE: 112 mmol/L — AB (ref 101–111)
CO2: 14 mmol/L — ABNORMAL LOW (ref 22–32)
CO2: 12 mmol/L — ABNORMAL LOW (ref 22–32)
CO2: 14 mmol/L — ABNORMAL LOW (ref 22–32)
CO2: 18 mmol/L — ABNORMAL LOW (ref 22–32)
CO2: 18 mmol/L — AB (ref 22–32)
CO2: 19 mmol/L — AB (ref 22–32)
CREATININE: 1 mg/dL (ref 0.44–1.00)
CREATININE: 0.9 mg/dL (ref 0.44–1.00)
Calcium: 9.1 mg/dL (ref 8.9–10.3)
Calcium: 9.3 mg/dL (ref 8.9–10.3)
Calcium: 9.1 mg/dL (ref 8.9–10.3)
Calcium: 9 mg/dL (ref 8.9–10.3)
Calcium: 8.4 mg/dL — ABNORMAL LOW (ref 8.9–10.3)
Calcium: 8.5 mg/dL — ABNORMAL LOW (ref 8.9–10.3)
Creatinine, Ser: 1.3 mg/dL — ABNORMAL HIGH (ref 0.44–1.00)
Creatinine, Ser: 1.4 mg/dL — ABNORMAL HIGH (ref 0.44–1.00)
Creatinine, Ser: 1.28 mg/dL — ABNORMAL HIGH (ref 0.44–1.00)
Creatinine, Ser: 1.25 mg/dL — ABNORMAL HIGH (ref 0.44–1.00)
GFR calc Af Amer: 56 mL/min — ABNORMAL LOW (ref >60)
GFR calc non Af Amer: 48 mL/min — ABNORMAL LOW (ref >60)
GFR calc non Af Amer: 44 mL/min — ABNORMAL LOW (ref >60)
GFR calc non Af Amer: 49 mL/min — ABNORMAL LOW (ref >60)
GFR calc non Af Amer: 50 mL/min — ABNORMAL LOW (ref >60)
GFR calc non Af Amer: >60 mL/min (ref >60)
GFR calc non Af Amer: >60 mL/min (ref >60)
GFR, EST AFRICAN AMERICAN: 51 mL/min — AB (ref >60)
GFR, EST AFRICAN AMERICAN: 57 mL/min — AB (ref >60)
GFR, EST AFRICAN AMERICAN: 58 mL/min — AB (ref >60)
GLUCOSE: 214 mg/dL — AB (ref 65–99)
GLUCOSE: 114 mg/dL — AB (ref 65–99)
Glucose, Bld: 343 mg/dL — ABNORMAL HIGH (ref 65–99)
Glucose, Bld: 394 mg/dL — ABNORMAL HIGH (ref 65–99)
Glucose, Bld: 273 mg/dL — ABNORMAL HIGH (ref 65–99)
Glucose, Bld: 209 mg/dL — ABNORMAL HIGH (ref 65–99)
POTASSIUM: 4.2 mmol/L (ref 3.5–5.1)
POTASSIUM: 4.3 mmol/L (ref 3.5–5.1)
POTASSIUM: 4.7 mmol/L (ref 3.5–5.1)
POTASSIUM: 5.3 mmol/L — AB (ref 3.5–5.1)
Potassium: 4.2 mmol/L (ref 3.5–5.1)
Potassium: 3.8 mmol/L (ref 3.5–5.1)
SODIUM: 139 mmol/L (ref 135–145)
SODIUM: 140 mmol/L (ref 135–145)
SODIUM: 141 mmol/L (ref 135–145)
SODIUM: 142 mmol/L (ref 135–145)
Sodium: 140 mmol/L (ref 135–145)
Sodium: 140 mmol/L (ref 135–145)

## 2015-02-09 LAB — CBG MONITORING, ED
GLUCOSE-CAPILLARY: 116 mg/dL — AB (ref 65–99)
GLUCOSE-CAPILLARY: 130 mg/dL — AB (ref 65–99)
GLUCOSE-CAPILLARY: 131 mg/dL — AB (ref 65–99)
GLUCOSE-CAPILLARY: 153 mg/dL — AB (ref 65–99)
GLUCOSE-CAPILLARY: 182 mg/dL — AB (ref 65–99)
GLUCOSE-CAPILLARY: 255 mg/dL — AB (ref 65–99)
GLUCOSE-CAPILLARY: 296 mg/dL — AB (ref 65–99)
Glucose-Capillary: 360 mg/dL — ABNORMAL HIGH (ref 65–99)
Glucose-Capillary: 145 mg/dL — ABNORMAL HIGH (ref 65–99)
Glucose-Capillary: 129 mg/dL — ABNORMAL HIGH (ref 65–99)
Glucose-Capillary: 121 mg/dL — ABNORMAL HIGH (ref 65–99)

## 2015-02-09 LAB — GLUCOSE, CAPILLARY
Glucose-Capillary: 106 mg/dL — ABNORMAL HIGH (ref 65–99)
Glucose-Capillary: 108 mg/dL — ABNORMAL HIGH (ref 65–99)
Glucose-Capillary: 194 mg/dL — ABNORMAL HIGH (ref 65–99)
Glucose-Capillary: 270 mg/dL — ABNORMAL HIGH (ref 65–99)
Glucose-Capillary: 214 mg/dL — ABNORMAL HIGH (ref 65–99)
Glucose-Capillary: 155 mg/dL — ABNORMAL HIGH (ref 65–99)
Glucose-Capillary: 143 mg/dL — ABNORMAL HIGH (ref 65–99)
Glucose-Capillary: 116 mg/dL — ABNORMAL HIGH (ref 65–99)
Glucose-Capillary: 103 mg/dL — ABNORMAL HIGH (ref 65–99)

## 2015-02-09 LAB — RAPID URINE DRUG SCREEN, HOSP PERFORMED
AMPHETAMINES: NOT DETECTED
BARBITURATES: NOT DETECTED
BENZODIAZEPINES: POSITIVE — AB
COCAINE: NOT DETECTED
Opiates: NOT DETECTED
TETRAHYDROCANNABINOL: POSITIVE — AB

## 2015-02-09 LAB — ETHANOL

## 2015-02-09 LAB — PREGNANCY, URINE: PREG TEST UR: NEGATIVE

## 2015-02-09 LAB — BETA-HYDROXYBUTYRIC ACID: Beta-Hydroxybutyric Acid: 4.42 mmol/L — ABNORMAL HIGH (ref 0.05–0.27)

## 2015-02-09 LAB — INFLUENZA PANEL BY PCR (TYPE A & B)
H1N1 flu by pcr: NOT DETECTED
INFLAPCR: NEGATIVE
INFLBPCR: NEGATIVE

## 2015-02-09 LAB — MRSA PCR SCREENING: MRSA by PCR: NEGATIVE

## 2015-02-09 LAB — TROPONIN I

## 2015-02-09 MED ORDER — POTASSIUM CHLORIDE 10 MEQ/100ML IV SOLN
10.0000 meq | INTRAVENOUS | Status: AC
  Administered 2015-02-09 (×2): 10 meq via INTRAVENOUS
  Filled 2015-02-09: qty 100

## 2015-02-09 MED ORDER — POTASSIUM CHLORIDE CRYS ER 20 MEQ PO TBCR
20.0000 meq | EXTENDED_RELEASE_TABLET | Freq: Once | ORAL | Status: AC
  Administered 2015-02-09: 20 meq via ORAL
  Filled 2015-02-09: qty 1

## 2015-02-09 MED ORDER — SODIUM CHLORIDE 0.9 % IV BOLUS (SEPSIS)
1000.0000 mL | Freq: Once | INTRAVENOUS | Status: AC
  Administered 2015-02-09: 1000 mL via INTRAVENOUS

## 2015-02-09 MED ORDER — HALOPERIDOL LACTATE 5 MG/ML IJ SOLN
2.0000 mg | Freq: Once | INTRAMUSCULAR | Status: AC
  Administered 2015-02-09: 2 mg via INTRAVENOUS
  Filled 2015-02-09: qty 1

## 2015-02-09 MED ORDER — DEXTROSE 10 % IV SOLN
INTRAVENOUS | Status: DC
  Administered 2015-02-09: 23:00:00 via INTRAVENOUS
  Filled 2015-02-09 (×3): qty 1000

## 2015-02-09 NOTE — ED Notes (Signed)
Pt very drowsy, easily able to arouse long enough to inform of plan of care but quickly drifts back to sleep. Unable to medicate pt at this time with PO meds for this reason.

## 2015-02-09 NOTE — Progress Notes (Signed)
UR completed 

## 2015-02-09 NOTE — ED Notes (Signed)
Spoke with Dr Posey Pronto about cbg's and he is increasing dextrose

## 2015-02-09 NOTE — ED Notes (Signed)
Pt arousable enough to state that her stomach hurts and acknowledge that she is being admitted to the hospital but quickly drifts back to sleep.

## 2015-02-09 NOTE — Progress Notes (Addendum)
Patient ID: [name], female   DOB: Mar 02, 1966, 49 y.o.   MRN: [id number] [facility] PROGRESS NOTE  Tirzah Fross PYP:[id number] DOB: [birth year] DOA: 02/08/2015 PCP: Barbette Merino, MD  Brief narrative:    49 year old female with past medical history of uncontrolled diabetes and related neuropathy and gastroparesis who presented to [facility] ED with nausea dan vomiting for past 2 days piror to this admission and poor oral intake. On admission, she was found to be in DKA and was started on insulin drip.   Anticipated discharge: once DKA resolves, likely by 8/18 or 8/19.  Assessment/Plan:    Principal Problem:   DKA, type 1 / Diabetes mellitus type 1, uncontrolled with gastroparesis and neuropathy / High anion gap metabolic acidosis  - DKA criteria on admission met: Glucose of admission 410 on CBG and on BMET 489. CO2 12, AG 18. On UA, ketones present with glucose more than 1000 - Started on insulin drip and admitted to SDU - Keep NPO for now with IV fluids per DKA protocol - CBG and BMET check every 1-2 hours per DKA protocol until AG closes or CO2 normalizes - DM coordinator consulted  - Check UDS and ethanol level. Last A1c 09/2014 was 8.1 indicating uncontrolled DM - Continue lyrica for neuropathy   Active Problems:   Nausea and vomiting / Diabetic gastroparesis - Apparently allergic to Reglan so she is now on erythromycin - Keep NPO until H/V improves  Iron deficiency anemia / Anemia of chronic kidney diease  - Continue ferrous sulfate supplementation   CKD stage 3 - Baseline creatinine from 09/2014 was 1.14 - Acute elevation in Cr on this admission to 1.28 likely prerenal and reflective of GI losses - Cr improving with IV fluids   Hyperkalemia - Corrected with insulin drip   DVT Prophylaxis  - Heparin subQ ordered    Code Status: Full.  Family Communication:  Family not at the bedside  Disposition Plan: Home likely once DKA resolved in next 1-2 days, by  02/11/2015  IV access:  Peripheral IV  Procedures and diagnostic studies:    No results found.  Medical Consultants:  None   Other Consultants:  DM coordinator  IAnti-Infectives:   None    Leisa Lenz, MD  [facility] Pager [phone]  Time spent in minutes: 25 minutes  If 7PM-7AM, please contact night-coverage [email or web address] Password [email or web address] 02/09/2015, 1:03 PM   LOS: 1 day    HPI/Subjective: No acute overnight events. Patient is sleeping.   Objective: Filed Vitals:   02/09/15 1100 02/09/15 1136 02/09/15 1148 02/09/15 1200  BP: 123/56 144/72  134/62  Pulse: 96   96  Temp:   98.6 F (37 C)   TempSrc:   Oral   Resp: 13   15  Height:   '5\' 2"'$  (1.575 m)   Weight:   61.4 kg (135 lb 5.8 oz)   SpO2: 100%   100%    Intake/Output Summary (Last 24 hours) at 02/09/15 1303 Last data filed at 02/09/15 1200  Gross per 24 hour  Intake 1059.17 ml  Output   1450 ml  Net -390.83 ml    Exam:   General:  Pt is not in acute distress  Cardiovascular: Regular rate and rhythm, S1/S2, no murmurs  Respiratory: Clear to auscultation bilaterally, no wheezing, no crackles, no rhonchi  Abdomen: Soft, non tender, non distended, bowel sounds present  Extremities: No edema, pulses DP and PT palpable bilaterally  Neuro: Grossly nonfocal  Data Reviewed: Basic Metabolic  Panel:  Recent Labs Lab 02/08/15 2205 02/09/15 0036 02/09/15 0140 02/09/15 0412 02/09/15 0544  NA 136 140 139 142 141  K 4.6 4.3 5.3* 4.7 4.2  CL 104 108 107 111 112*  CO2 13* 14* 12* 14* 18*  GLUCOSE 499* 343* 394* 273* 209*  BUN 26* 23* 23* 21* 21*  CREATININE 1.37* 1.30* 1.40* 1.28* 1.25*  CALCIUM 9.1 9.1 9.3 9.1 9.0   Liver Function Tests:  Recent Labs Lab 02/08/15 1908  AST 23  ALT 17  ALKPHOS 88  BILITOT 1.7*  PROT 7.2  ALBUMIN 3.7    Recent Labs Lab 02/08/15 1908  LIPASE 24   No results for input(s): AMMONIA in the last 168 hours. CBC:  Recent Labs Lab 02/08/15 1908   WBC 10.7*  NEUTROABS 9.8*  HGB 12.0  HCT 35.7*  MCV 90.2  PLT 266   Cardiac Enzymes:  Recent Labs Lab 02/09/15 0036  TROPONINI <0.03   BNP: Invalid input(s): POCBNP CBG:  Recent Labs Lab 02/09/15 0924 02/09/15 1003 02/09/15 1027 02/09/15 1107 02/09/15 1229  GLUCAP 130* 116* 129* 121* 106*    No results found for this or any previous visit (from the past 240 hour(s)).   Scheduled Meds: . dronabinol  10 mg Oral BID  . erythromycin  250 mg Intravenous 4 times per day  . ferrous sulfate  325 mg Oral Daily  . heparin  5,000 Units Subcutaneous 3 times per day  . pantoprazole  40 mg Oral BID  . pregabalin  150 mg Oral BID   Continuous Infusions: . sodium chloride 100 mL/hr at 02/09/15 0209  . dextrose 5 % and 0.45% NaCl 125 mL/hr at 02/09/15 1230  . insulin (NOVOLIN-R) infusion Stopped (02/09/15 1229)

## 2015-02-09 NOTE — ED Notes (Signed)
2nd IV attempted, unsuccessful

## 2015-02-09 NOTE — ED Notes (Signed)
IV team unable to obtain a second IV

## 2015-02-09 NOTE — Progress Notes (Signed)
Inpatient Diabetes Program Recommendations  AACE/ADA: New Consensus Statement on Inpatient Glycemic Control (2013)  Target Ranges:  Prepandial:   less than 140 mg/dL      Peak postprandial:   less than 180 mg/dL (1-2 hours)      Critically ill patients:  140 - 180 mg/dL   Results for [name], [name] (MRN [id number]) as of 02/09/2015 10:26  Ref. Range 02/08/2015 19:08  Sodium Latest Ref Range: 135-145 mmol/L 134 (L)  Potassium Latest Ref Range: 3.5-5.1 mmol/L 4.7  Chloride Latest Ref Range: 101-111 mmol/L 104  CO2 Latest Ref Range: 22-32 mmol/L 12 (L)  BUN Latest Ref Range: 6-20 mg/dL 25 (H)  Creatinine Latest Ref Range: 0.44-1.00 mg/dL 1.28 (H)  Calcium Latest Ref Range: 8.9-10.3 mg/dL 9.0  EGFR (Non-African Amer.) Latest Ref Range: >60 mL/min 49 (L)  EGFR (African American) Latest Ref Range: >60 mL/min 57 (L)  Glucose Latest Ref Range: 65-99 mg/dL 489 (H)  Anion gap Latest Ref Range: 5-15  18 (H)    Admit with: DKA (Glucose 489 mg/dl, Anion Gap 18, CO2 12)  History: Type 1 DM (dxd age 49), Gastroparesis, HTN  Home DM Meds: Levemir 15 units QHS        Novolog 0-15 units tid per SSI  Current DM Orders: IV Insulin drip (started at 9pm last night)    -Note BMET from 5:44am showed improvement: CO2 up to 18 and Anion Gap down to 11.  -Note D5 1/2 NS IVF increased to 125 cc/hr this AM as well.    MD- When patient's labs have improved and patient is ready to transition off IV insulin drip, please make sure patient receives her home dose of Levemir 1-2 hours before IV insulin drip stopped.    Will follow Wyn Quaker RN, MSN, CDE Diabetes Coordinator Inpatient Glycemic Control Team Team Pager: [phone] (8a-5p)

## 2015-02-10 DIAGNOSIS — E876 Hypokalemia: Secondary | ICD-10-CM

## 2015-02-10 DIAGNOSIS — E1010 Type 1 diabetes mellitus with ketoacidosis without coma: Principal | ICD-10-CM

## 2015-02-10 LAB — BASIC METABOLIC PANEL
ANION GAP: 8 (ref 5–15)
Anion gap: 6 (ref 5–15)
Anion gap: 7 (ref 5–15)
BUN: 10 mg/dL (ref 6–20)
BUN: 12 mg/dL (ref 6–20)
BUN: 8 mg/dL (ref 6–20)
CALCIUM: 7.5 mg/dL — AB (ref 8.9–10.3)
CHLORIDE: 113 mmol/L — AB (ref 101–111)
CHLORIDE: 114 mmol/L — AB (ref 101–111)
CO2: 15 mmol/L — ABNORMAL LOW (ref 22–32)
CO2: 16 mmol/L — AB (ref 22–32)
CO2: 19 mmol/L — AB (ref 22–32)
CREATININE: 0.85 mg/dL (ref 0.44–1.00)
CREATININE: 1.03 mg/dL — AB (ref 0.44–1.00)
Calcium: 7.5 mg/dL — ABNORMAL LOW (ref 8.9–10.3)
Calcium: 7.6 mg/dL — ABNORMAL LOW (ref 8.9–10.3)
Chloride: 115 mmol/L — ABNORMAL HIGH (ref 101–111)
Creatinine, Ser: 0.73 mg/dL (ref 0.44–1.00)
GFR calc Af Amer: >60 mL/min (ref >60)
GFR calc non Af Amer: >60 mL/min (ref >60)
GFR calc non Af Amer: >60 mL/min (ref >60)
GLUCOSE: 115 mg/dL — AB (ref 65–99)
GLUCOSE: 74 mg/dL (ref 65–99)
Glucose, Bld: 214 mg/dL — ABNORMAL HIGH (ref 65–99)
Potassium: 3.3 mmol/L — ABNORMAL LOW (ref 3.5–5.1)
Potassium: 3.5 mmol/L (ref 3.5–5.1)
Potassium: 4.1 mmol/L (ref 3.5–5.1)
Sodium: 137 mmol/L (ref 135–145)
Sodium: 137 mmol/L (ref 135–145)
Sodium: 139 mmol/L (ref 135–145)

## 2015-02-10 LAB — CBC
HCT: 32.4 % — ABNORMAL LOW (ref 36.0–46.0)
Hemoglobin: 10.7 g/dL — ABNORMAL LOW (ref 12.0–15.0)
MCH: 30.5 pg (ref 26.0–34.0)
MCHC: 33 g/dL (ref 30.0–36.0)
MCV: 92.3 fL (ref 78.0–100.0)
PLATELETS: 260 10*3/uL (ref 150–400)
RBC: 3.51 MIL/uL — ABNORMAL LOW (ref 3.87–5.11)
RDW: 13.6 % (ref 11.5–15.5)
WBC: 9 10*3/uL (ref 4.0–10.5)

## 2015-02-10 LAB — GLUCOSE, CAPILLARY
GLUCOSE-CAPILLARY: 204 mg/dL — AB (ref 65–99)
GLUCOSE-CAPILLARY: 195 mg/dL — AB (ref 65–99)
GLUCOSE-CAPILLARY: 112 mg/dL — AB (ref 65–99)
GLUCOSE-CAPILLARY: 76 mg/dL (ref 65–99)
GLUCOSE-CAPILLARY: 111 mg/dL — AB (ref 65–99)
GLUCOSE-CAPILLARY: 304 mg/dL — AB (ref 65–99)
GLUCOSE-CAPILLARY: 191 mg/dL — AB (ref 65–99)
GLUCOSE-CAPILLARY: 154 mg/dL — AB (ref 65–99)
Glucose-Capillary: 148 mg/dL — ABNORMAL HIGH (ref 65–99)
Glucose-Capillary: 74 mg/dL (ref 65–99)
Glucose-Capillary: 96 mg/dL (ref 65–99)
Glucose-Capillary: 164 mg/dL — ABNORMAL HIGH (ref 65–99)
Glucose-Capillary: 182 mg/dL — ABNORMAL HIGH (ref 65–99)
Glucose-Capillary: 218 mg/dL — ABNORMAL HIGH (ref 65–99)
Glucose-Capillary: 68 mg/dL (ref 65–99)
Glucose-Capillary: 152 mg/dL — ABNORMAL HIGH (ref 65–99)

## 2015-02-10 MED ORDER — SODIUM CHLORIDE 0.9 % IV BOLUS (SEPSIS)
1000.0000 mL | Freq: Once | INTRAVENOUS | Status: AC
  Administered 2015-02-10: 1000 mL via INTRAVENOUS

## 2015-02-10 MED ORDER — INSULIN DETEMIR 100 UNIT/ML ~~LOC~~ SOLN
15.0000 [IU] | Freq: Every day | SUBCUTANEOUS | Status: DC
  Administered 2015-02-10 – 2015-02-12 (×3): 15 [IU] via SUBCUTANEOUS
  Filled 2015-02-10 (×3): qty 0.15

## 2015-02-10 MED ORDER — INSULIN GLARGINE 100 UNIT/ML ~~LOC~~ SOLN
3.0000 [IU] | Freq: Once | SUBCUTANEOUS | Status: AC
  Administered 2015-02-10: 3 [IU] via SUBCUTANEOUS
  Filled 2015-02-10: qty 0.03

## 2015-02-10 MED ORDER — SODIUM CHLORIDE 0.9 % IV SOLN
INTRAVENOUS | Status: DC
  Administered 2015-02-10 – 2015-02-12 (×3): via INTRAVENOUS

## 2015-02-10 MED ORDER — ONDANSETRON HCL 4 MG/2ML IJ SOLN
4.0000 mg | Freq: Four times a day (QID) | INTRAMUSCULAR | Status: DC | PRN
  Administered 2015-02-11: 4 mg via INTRAVENOUS
  Filled 2015-02-10: qty 2

## 2015-02-10 MED ORDER — INSULIN ASPART 100 UNIT/ML ~~LOC~~ SOLN
0.0000 [IU] | Freq: Three times a day (TID) | SUBCUTANEOUS | Status: DC
  Administered 2015-02-10: 15 [IU] via SUBCUTANEOUS
  Administered 2015-02-10 – 2015-02-12 (×2): 4 [IU] via SUBCUTANEOUS
  Administered 2015-02-12: 3 [IU] via SUBCUTANEOUS
  Administered 2015-02-13: 4 [IU] via SUBCUTANEOUS

## 2015-02-10 MED ORDER — DEXTROSE 50 % IV SOLN
INTRAVENOUS | Status: AC
  Administered 2015-02-10: 13 mL
  Filled 2015-02-10: qty 50

## 2015-02-10 MED ORDER — INSULIN ASPART 100 UNIT/ML ~~LOC~~ SOLN: 0.0000 [IU] | Freq: Every day | SUBCUTANEOUS | Status: DC

## 2015-02-10 MED ORDER — POTASSIUM CHLORIDE CRYS ER 20 MEQ PO TBCR
20.0000 meq | EXTENDED_RELEASE_TABLET | Freq: Once | ORAL | Status: AC
  Administered 2015-02-10: 20 meq via ORAL
  Filled 2015-02-10: qty 1

## 2015-02-10 NOTE — Progress Notes (Signed)
Pt cbg at 0838 was 68.  Admin 32mL of D50 per glucostabalizer.  Stopped IV insulin per MD order.  Rechecked cbg and was 111.  Irven Baltimore, RN

## 2015-02-10 NOTE — Progress Notes (Addendum)
Patient ID: [name], female   DOB: 20-Oct-1965, 49 y.o.   MRN: [id number] [facility] PROGRESS NOTE  [name] TTS:[id number] DOB: 08/16/1965 DOA: 02/08/2015 PCP: Barbette Merino, MD  Brief narrative:    49 year old female with past medical history of uncontrolled diabetes and related neuropathy and gastroparesis who presented to [facility] ED with nausea dan vomiting for past 2 days piror to this admission and poor oral intake. On admission, she was found to be in DKA and was started on insulin drip.   Anticipated discharge: once DKA resolves. Transfer to telemetry today.   Assessment/Plan:    Principal Problem:   DKA, type 1 /  / High anion gap metabolic acidosis  - DKA criteria met on admission with CBG of 410 and glucose 489. CO2 12, AG 18. Urinalysis showed ketones with glucose of more than 1000 - Pt admitted to SDU since she was started on insulin drip. - Anion gap closed this am. She still has slight acidosis but most likely will improve with fluids. - CBG's in past 12 hours: 76, 68, 111 - Will give 3 units of Lantus now and then stop insulin drip per DKA protocol - Will start SSI [facility] & HS coverage) - Transfer to telemetry floor  - Advance diet to carb modified   Active Problems: Diabetes mellitus type 1, uncontrolled with gastroparesis and neuropathy - A1c in 09/2014 was 8.1 indicating uncontrolled DM - DM coordinator consulted  - Of note, UDS positive for benzos and THC - Continue lyrica for neuropathy   Nausea and vomiting / Diabetic gastroparesis - Apparently allergic to Reglan so she is on erythromycin - Diet advanced to carb modified per pt request   Iron deficiency anemia / Anemia of chronic kidney diease  - Continue ferrous sulfate supplementation   CKD stage 3 - Baseline creatinine from 09/2014 was 1.14 - Acute elevation in Cr on this admission to 1.28 likely prerenal and reflective of GI losses - Cr normalized with IV fluids   Hyperkalemia - Corrected  with insulin drip   DVT Prophylaxis  - Heparin subQ ordered    Code Status: Full.  Family Communication:  Family not at the bedside  Disposition Plan: transfer to telemetry   IV access:  Peripheral IV  Procedures and diagnostic studies:    No results found.  Medical Consultants:  None   Other Consultants:  DM coordinator  IAnti-Infectives:   None    Leisa Lenz, MD  [facility] Pager [phone]  Time spent in minutes: 25 minutes  If 7PM-7AM, please contact night-coverage [email or web address] Password [email or web address] 02/10/2015, 11:53 AM   LOS: 2 days    HPI/Subjective: No acute overnight events. Patient is sleeping.   Objective: Filed Vitals:   02/10/15 0600 02/10/15 0630 02/10/15 0700 02/10/15 0800  BP: 94/45 119/57 118/63   Pulse: 83 88 81   Temp:    98.1 F (36.7 C)  TempSrc:    Oral  Resp: 14 16 13    Height:      Weight:      SpO2: 96% 100% 100%     Intake/Output Summary (Last 24 hours) at 02/10/15 1153 Last data filed at 02/10/15 0800  Gross per 24 hour  Intake 2603.39 ml  Output   1100 ml  Net 1503.39 ml    Exam:   General:  Pt is not in acute distress  Cardiovascular: Regular rate and rhythm, S1/S2, no murmurs  Respiratory: Clear to auscultation bilaterally, no wheezing, no crackles, no rhonchi  Abdomen: Soft, non tender, non distended, bowel sounds present  Extremities: No edema, pulses DP and PT palpable bilaterally  Neuro: Grossly nonfocal  Data Reviewed: Basic Metabolic Panel:  Recent Labs Lab 02/09/15 1649 02/09/15 2050 02/09/15 2335 02/10/15 0416 02/10/15 0803  NA 140 140 139 137 137  K 4.2 3.8 3.3* 3.5 4.1  CL 111 112* 114* 113* 115*  CO2 18* 19* 19* 16* 15*  GLUCOSE 214* 114* 115* 214* 74  BUN 15 13 12 10 8   CREATININE 1.00 0.90 0.85 1.03* 0.73  CALCIUM 8.4* 8.5* 7.6* 7.5* 7.5*   Liver Function Tests:  Recent Labs Lab 02/08/15 1908  AST 23  ALT 17  ALKPHOS 88  BILITOT 1.7*  PROT 7.2  ALBUMIN 3.7     Recent Labs Lab 02/08/15 1908  LIPASE 24   No results for input(s): AMMONIA in the last 168 hours. CBC:  Recent Labs Lab 02/08/15 1908 02/10/15 0416  WBC 10.7* 9.0  NEUTROABS 9.8*  --   HGB 12.0 10.7*  HCT 35.7* 32.4*  MCV 90.2 92.3  PLT 266 260   Cardiac Enzymes:  Recent Labs Lab 02/09/15 0036  TROPONINI <0.03   BNP: Invalid input(s): POCBNP CBG:  Recent Labs Lab 02/10/15 0524 02/10/15 0624 02/10/15 0728 02/10/15 0837 02/10/15 0922  GLUCAP 164* 112* 76 68 111*    Recent Results (from the past 240 hour(s))  MRSA PCR Screening     Status: None   Collection Time: 02/09/15 11:48 AM  Result Value Ref Range Status   MRSA by PCR NEGATIVE NEGATIVE Final     Scheduled Meds: . dronabinol  10 mg Oral BID  . erythromycin  250 mg Intravenous 4 times per day  . ferrous sulfate  325 mg Oral Daily  . heparin  5,000 Units Subcutaneous 3 times per day  . pantoprazole  40 mg Oral BID  . pregabalin  150 mg Oral BID   Continuous Infusions: . dextrose 100 mL/hr at 02/09/15 2234

## 2015-02-10 NOTE — Progress Notes (Signed)
Received from ICU 49 YO female A&Ox3 skin WDI IV infusing positional.

## 2015-02-10 NOTE — Progress Notes (Signed)
Called and gave report to April RN.  Irven Baltimore, RN

## 2015-02-10 NOTE — Discharge Instructions (Signed)

## 2015-02-10 NOTE — Progress Notes (Signed)
Inpatient Diabetes Program Recommendations  AACE/ADA: New Consensus Statement on Inpatient Glycemic Control (2013)  Target Ranges:  Prepandial:   less than 140 mg/dL      Peak postprandial:   less than 180 mg/dL (1-2 hours)      Critically ill patients:  140 - 180 mg/dL   Results for [name], [name] (MRN [id number]) as of 02/10/2015 11:07  Ref. Range 02/10/2015 08:03  Sodium Latest Ref Range: 135-145 mmol/L 137  Potassium Latest Ref Range: 3.5-5.1 mmol/L 4.1  Chloride Latest Ref Range: 101-111 mmol/L 115 (H)  CO2 Latest Ref Range: 22-32 mmol/L 15 (L)  BUN Latest Ref Range: 6-20 mg/dL 8  Creatinine Latest Ref Range: 0.44-1.00 mg/dL 0.73  Calcium Latest Ref Range: 8.9-10.3 mg/dL 7.5 (L)  EGFR (Non-African Amer.) Latest Ref Range: >60 mL/min >60  EGFR (African American) Latest Ref Range: >60 mL/min >60  Glucose Latest Ref Range: 65-99 mg/dL 74  Anion gap Latest Ref Range: 5-15  7    Home DM Meds: Levemir 15 units QHS  Novolog 0-15 units tid per SSI  Current DM Orders: None at present (Patient was on IV insulin drip earlier this AM- Was given 3 units Lantus at 9:40am to transition off IV insulin drip)    Spoke with patient and her son this AM.  Patient was feeling very anxious at the time of my visit (per son's report).  Patient was very quiet and would only answer my questions with nods or shakes of her head.  Son told me patient needs to be given her Ativan dose but also told me the doctor told them to hold off giving patient Ativan b/c it was making her sleepy.  Patient nodded her head yes to my verification questions about her home insulins being: Levemir 15 units QHS and Novolog SSI tidwc.  Son of patient told me patient is seeking care of a GI Specialist and would like to get established with an Endocrinologist at [facility] in [place].     -Note Novolog Resistant SSI started at 12 PM today.  CBG at 12PM was 304 mg/dl.  -Concern for  patient's BMET this AM.  Anion Gap showed closure at 7 but CO2 still low at 15.  Note Dr. Fortino Sic note today.  Hopeful CO2 will improve with further IVF and glucose intake from food.  -Patient also to start Levemir 15 units QHS tonight.  May want to start Levemir earlier this evening since patient only received 3 units basal insulin earlier this AM and then get on bedtime schedule tomorrow night (08/19).  Will call MD and ask if they would like to start the Levemir earlier closer to 5-6 pm tonight and then 10pm for tomorrow.  -May also want to reduce Novolog SSI to Moderate scale for now.     Will follow Wyn Quaker RN, MSN, CDE Diabetes Coordinator Inpatient Glycemic Control Team Team Pager: [phone] (8a-5p)

## 2015-02-11 DIAGNOSIS — E1065 Type 1 diabetes mellitus with hyperglycemia: Secondary | ICD-10-CM

## 2015-02-11 LAB — GLUCOSE, CAPILLARY
GLUCOSE-CAPILLARY: 113 mg/dL — AB (ref 65–99)
GLUCOSE-CAPILLARY: 117 mg/dL — AB (ref 65–99)
GLUCOSE-CAPILLARY: 153 mg/dL — AB (ref 65–99)
Glucose-Capillary: 181 mg/dL — ABNORMAL HIGH (ref 65–99)

## 2015-02-11 LAB — BASIC METABOLIC PANEL
Anion gap: 11 (ref 5–15)
CO2: 16 mmol/L — ABNORMAL LOW (ref 22–32)
Calcium: 8.4 mg/dL — ABNORMAL LOW (ref 8.9–10.3)
Chloride: 111 mmol/L (ref 101–111)
Creatinine, Ser: 0.81 mg/dL (ref 0.44–1.00)
GFR calc Af Amer: >60 mL/min (ref >60)
GLUCOSE: 101 mg/dL — AB (ref 65–99)
POTASSIUM: 3.6 mmol/L (ref 3.5–5.1)
Sodium: 138 mmol/L (ref 135–145)

## 2015-02-11 LAB — CBC
HEMATOCRIT: 33.7 % — AB (ref 36.0–46.0)
Hemoglobin: 11.8 g/dL — ABNORMAL LOW (ref 12.0–15.0)
MCH: 30.6 pg (ref 26.0–34.0)
MCHC: 35 g/dL (ref 30.0–36.0)
MCV: 87.3 fL (ref 78.0–100.0)
Platelets: 264 10*3/uL (ref 150–400)
RBC: 3.86 MIL/uL — AB (ref 3.87–5.11)
RDW: 13.4 % (ref 11.5–15.5)
WBC: 8 10*3/uL (ref 4.0–10.5)

## 2015-02-11 MED ORDER — PROMETHAZINE HCL 25 MG/ML IJ SOLN
25.0000 mg | Freq: Four times a day (QID) | INTRAMUSCULAR | Status: DC | PRN
  Administered 2015-02-12: 25 mg via INTRAVENOUS
  Filled 2015-02-11: qty 1

## 2015-02-11 MED ORDER — ACETAMINOPHEN 325 MG PO TABS
650.0000 mg | ORAL_TABLET | Freq: Four times a day (QID) | ORAL | Status: DC | PRN
  Administered 2015-02-11: 650 mg via ORAL
  Filled 2015-02-11: qty 2

## 2015-02-11 NOTE — Progress Notes (Signed)
Sitting of in bed eating bites of sandwich.encouraged to try and eat.

## 2015-02-11 NOTE — Progress Notes (Signed)
Discussed w/ pt need to sit up and eat CBG already much lower than previous.OOB chair X1 today.

## 2015-02-11 NOTE — Progress Notes (Addendum)
Patient ID: [name], female   DOB: 10-Feb-1966, 49 y.o.   MRN: [id number] [facility] PROGRESS NOTE  [name] [id number]:[id number] DOB: [birth year] DOA: 02/08/2015 PCP: Barbette Merino, MD  Brief narrative:    49 year old female with past medical history of uncontrolled diabetes and related neuropathy and gastroparesis who presented to [facility] ED with nausea dan vomiting for past 2 days piror to this admission and poor oral intake. On admission, she was found to be in DKA and was started on insulin drip.   Anticipated discharge: monitor for 24 hours and if tolerates PO intake D/C 8/20.  Assessment/Plan:    Principal Problem:   DKA, type 1 / High anion gap metabolic acidosis  - DKA criteria met on admission with CBG of 410 and glucose 489. CO2 12, AG 18. Urinalysis showed ketones with glucose of more than 1000 - Pt admitted to SDU because she required insulin drip. Transferred to telemetry 8/19. - Now transitioned to sub Q insulin per home dose: Levemir 15 units at bedtime dn SSI - Diet carb modified   Active Problems: Diabetes mellitus type 1, uncontrolled with gastroparesis and neuropathy - A1c in 09/2014 was 8.1 indicating uncontrolled DM - DM coordinator consulted and we appreciate their assessment  - Of note, UDS positive for benzos and THC - Continue lyrica for neuropathy   Nausea and vomiting / Diabetic gastroparesis - Apparently allergic to Reglan so she is on erythromycin. Will stop erythromycin today and see how she does in next 24 hours. - Change Zofran to phenergan to see if nausea better controlled.   Iron deficiency anemia / Anemia of chronic kidney diease  - Continue ferrous sulfate supplementation   CKD stage 3 - Baseline creatinine from 09/2014 was 1.14 - Acute elevation in Cr on this admission to 1.28 likely prerenal and reflective of GI losses - Cr normalized with IV fluids   Hyperkalemia - Corrected with insulin drip   DVT Prophylaxis  - Heparin subQ  ordered while pt in hospital   Code Status: Full.  Family Communication:  Family not at the bedside  Disposition Plan: 02/12/15  IV access:  Peripheral IV  Procedures and diagnostic studies:    No results found.  Medical Consultants:  None   Other Consultants:  DM coordinator  IAnti-Infectives:   None    Leisa Lenz, MD  [facility] Pager [phone]  Time spent in minutes: 15 minutes  If 7PM-7AM, please contact night-coverage [email or web address] Password [email or web address] 02/11/2015, 10:20 AM   LOS: 3 days    HPI/Subjective: No acute overnight events. Patient reports nausea but no vomiting.  Objective: Filed Vitals:   02/10/15 1634 02/10/15 2136 02/11/15 0158 02/11/15 0437  BP: 160/99 159/62 111/50 124/57  Pulse: 94 102 89 85  Temp: 98.9 F (37.2 C) 99.5 F (37.5 C) 98.7 F (37.1 C) 99.7 F (37.6 C)  TempSrc: Oral Oral Oral Axillary  Resp: 22 20 18 18   Height: 5' 2"  (1.575 m)     Weight:      SpO2: 100% 100% 98% 99%    Intake/Output Summary (Last 24 hours) at 02/11/15 1020 Last data filed at 02/11/15 0700  Gross per 24 hour  Intake 1125.83 ml  Output   2000 ml  Net -874.17 ml    Exam:   General:  Pt is not in acute distress  Cardiovascular: Regular rate and rhythm, S1/S2 (+)  Respiratory: No wheezing, no crackles, no rhonchi  Abdomen: Soft, non distended, (+) BS  Extremities: No edema,  pulses palpable   Neuro: Nonfocal  Data Reviewed: Basic Metabolic Panel:  Recent Labs Lab 02/09/15 2050 02/09/15 2335 02/10/15 0416 02/10/15 0803 02/11/15 0500  NA 140 139 137 137 138  K 3.8 3.3* 3.5 4.1 3.6  CL 112* 114* 113* 115* 111  CO2 19* 19* 16* 15* 16*  GLUCOSE 114* 115* 214* 74 101*  BUN 13 12 10 8  <5*  CREATININE 0.90 0.85 1.03* 0.73 0.81  CALCIUM 8.5* 7.6* 7.5* 7.5* 8.4*   Liver Function Tests:  Recent Labs Lab 02/08/15 1908  AST 23  ALT 17  ALKPHOS 88  BILITOT 1.7*  PROT 7.2  ALBUMIN 3.7    Recent Labs Lab 02/08/15 1908   LIPASE 24   No results for input(s): AMMONIA in the last 168 hours. CBC:  Recent Labs Lab 02/08/15 1908 02/10/15 0416 02/11/15 0500  WBC 10.7* 9.0 8.0  NEUTROABS 9.8*  --   --   HGB 12.0 10.7* 11.8*  HCT 35.7* 32.4* 33.7*  MCV 90.2 92.3 87.3  PLT 266 260 264   Cardiac Enzymes:  Recent Labs Lab 02/09/15 0036  TROPONINI <0.03   BNP: Invalid input(s): POCBNP CBG:  Recent Labs Lab 02/10/15 1158 02/10/15 1534 02/10/15 1654 02/10/15 2207 02/11/15 0733  GLUCAP 304* 191* 154* 152* 113*    Recent Results (from the past 240 hour(s))  MRSA PCR Screening     Status: None   Collection Time: 02/09/15 11:48 AM  Result Value Ref Range Status   MRSA by PCR NEGATIVE NEGATIVE Final     Scheduled Meds: . dronabinol  10 mg Oral BID  . ferrous sulfate  325 mg Oral Daily  . heparin  5,000 Units Subcutaneous 3 times per day  . insulin aspart  0-20 Units Subcutaneous TID WC  . insulin aspart  0-5 Units Subcutaneous QHS  . insulin detemir  15 Units Subcutaneous QHS  . pantoprazole  40 mg Oral BID  . pregabalin  150 mg Oral BID   Continuous Infusions: . sodium chloride 50 mL/hr at 02/11/15 0015

## 2015-02-12 ENCOUNTER — Inpatient Hospital Stay (HOSPITAL_COMMUNITY): Payer: 59

## 2015-02-12 DIAGNOSIS — E1069 Type 1 diabetes mellitus with other specified complication: Secondary | ICD-10-CM

## 2015-02-12 LAB — GLUCOSE, CAPILLARY
GLUCOSE-CAPILLARY: 117 mg/dL — AB (ref 65–99)
GLUCOSE-CAPILLARY: 160 mg/dL — AB (ref 65–99)
GLUCOSE-CAPILLARY: 136 mg/dL — AB (ref 65–99)
Glucose-Capillary: 125 mg/dL — ABNORMAL HIGH (ref 65–99)
Glucose-Capillary: 66 mg/dL (ref 65–99)

## 2015-02-12 MED ORDER — SODIUM CHLORIDE 0.45 % IV BOLUS
1000.0000 mL | Freq: Once | INTRAVENOUS | Status: AC
  Administered 2015-02-12: 1000 mL via INTRAVENOUS

## 2015-02-12 MED ORDER — DOCUSATE SODIUM 100 MG PO CAPS
100.0000 mg | ORAL_CAPSULE | Freq: Two times a day (BID) | ORAL | Status: DC
  Administered 2015-02-12 – 2015-02-13 (×3): 100 mg via ORAL
  Filled 2015-02-12 (×4): qty 1

## 2015-02-12 MED ORDER — ONDANSETRON HCL 4 MG/2ML IJ SOLN
4.0000 mg | Freq: Four times a day (QID) | INTRAMUSCULAR | Status: DC | PRN
  Administered 2015-02-12: 4 mg via INTRAVENOUS
  Filled 2015-02-12: qty 2

## 2015-02-12 MED ORDER — POLYETHYLENE GLYCOL 3350 17 G PO PACK
17.0000 g | PACK | Freq: Every day | ORAL | Status: DC
  Administered 2015-02-12 – 2015-02-13 (×2): 17 g via ORAL
  Filled 2015-02-12 (×3): qty 1

## 2015-02-12 NOTE — Progress Notes (Signed)
Patient ID: [name], female   DOB: January 09, 1966, 49 y.o.   MRN: [id number] [facility] PROGRESS NOTE  Saleha Kalp [id number]:[id number] DOB: [birth year] DOA: 02/08/2015 PCP: Barbette Merino, MD  Brief narrative:    49 year old female with past medical history of uncontrolled diabetes and related neuropathy and gastroparesis who presented to [facility] ED with nausea dan vomiting for past 2 days piror to this admission and poor oral intake. On admission, she was found to be in DKA and was started on insulin drip.   Anticipated discharge: 8/21 if GI symptoms better controlled.   Assessment/Plan:    Principal Problem:   DKA, type 1 / High anion gap metabolic acidosis  - DKA criteria met on admission with CBG of 410 and glucose 489. CO2 12, AG 18. Urinalysis showed ketones with glucose of more than 1000 - Transferred out of SDU 8/19 - CBG's in past 24 hours: 181, 117, 160 - Continue Levemir 15 units at bedtime along with SSI - Diet as tolerated   Active Problems: Diabetes mellitus type 1, uncontrolled with gastroparesis and neuropathy - A1c in 09/2014 was 8.1 indicating uncontrolled DM - DM coordinator consulted - Of note, UDS positive for benzos and THC - Continue lyrica for neuropathy   Nausea and vomiting / Diabetic gastroparesis - Apparently allergic to Reglan so she was on erythromycin which we stopped 8/20 - Change phenergan PRN nausea.  - Abd x ray this am with no acute findings   Iron deficiency anemia / Anemia of chronic kidney diease  - Continue ferrous sulfate supplementation   CKD stage 3 - Baseline creatinine from 09/2014 was 1.14 - Acute elevation in Cr on this admission to 1.28 likely prerenal and reflective of GI losses - Cr normalized with IV fluids   Hyperkalemia - Corrected with insulin drip   DVT Prophylaxis  - Heparin subQ    Code Status: Full.  Family Communication:  Family not at the bedside  Disposition Plan: 02/13/2015 if nausea improving   IV access:   Peripheral IV  Procedures and diagnostic studies:    Dg Abd Portable 1v 02/12/2015   No acute abnormality noted.   Electronically Signed   By: Inez Catalina M.D.   On: 02/12/2015 10:18    Medical Consultants:  None   Other Consultants:  DM coordinator  IAnti-Infectives:   None    Leisa Lenz, MD  [facility] Pager [phone]  Time spent in minutes: 15 minutes  If 7PM-7AM, please contact night-coverage [email or web address] Password [email or web address] 02/12/2015, 2:52 PM   LOS: 4 days    HPI/Subjective: No acute overnight events. Patient reports nausea and abdominal pain.   Objective: Filed Vitals:   02/11/15 2111 02/12/15 0227 02/12/15 0523 02/12/15 1156  BP: 126/59 165/72 156/76 102/45  Pulse: 84 90 94 87  Temp: 98.7 F (37.1 C) 98.4 F (36.9 C) 98.7 F (37.1 C) 99.1 F (37.3 C)  TempSrc: Oral Oral Oral Oral  Resp: 16 16 18 14   Height:      Weight:      SpO2: 99% 100% 100% 100%    Intake/Output Summary (Last 24 hours) at 02/12/15 1452 Last data filed at 02/12/15 0943  Gross per 24 hour  Intake   1200 ml  Output   4200 ml  Net  -3000 ml    Exam:   General:  Pt is alert, no distress  Cardiovascular: Rate controlled, (+) S1, S2  Respiratory: Bilateral air entry, no wheezing   Abdomen: (+) BS, non tender,  non distended   Extremities: No leg swelling, pulses palpable   Neuro: No focal deficits   Data Reviewed: Basic Metabolic Panel:  Recent Labs Lab 02/09/15 2050 02/09/15 2335 02/10/15 0416 02/10/15 0803 02/11/15 0500  NA 140 139 137 137 138  K 3.8 3.3* 3.5 4.1 3.6  CL 112* 114* 113* 115* 111  CO2 19* 19* 16* 15* 16*  GLUCOSE 114* 115* 214* 74 101*  BUN 13 12 10 8  <5*  CREATININE 0.90 0.85 1.03* 0.73 0.81  CALCIUM 8.5* 7.6* 7.5* 7.5* 8.4*   Liver Function Tests:  Recent Labs Lab 02/08/15 1908  AST 23  ALT 17  ALKPHOS 88  BILITOT 1.7*  PROT 7.2  ALBUMIN 3.7    Recent Labs Lab 02/08/15 1908  LIPASE 24   No results for input(s):  AMMONIA in the last 168 hours. CBC:  Recent Labs Lab 02/08/15 1908 02/10/15 0416 02/11/15 0500  WBC 10.7* 9.0 8.0  NEUTROABS 9.8*  --   --   HGB 12.0 10.7* 11.8*  HCT 35.7* 32.4* 33.7*  MCV 90.2 92.3 87.3  PLT 266 260 264   Cardiac Enzymes:  Recent Labs Lab 02/09/15 0036  TROPONINI <0.03   BNP: Invalid input(s): POCBNP CBG:  Recent Labs Lab 02/11/15 1151 02/11/15 1627 02/11/15 2110 02/12/15 0808 02/12/15 1152  GLUCAP 117* 153* 181* 117* 160*    Recent Results (from the past 240 hour(s))  MRSA PCR Screening     Status: None   Collection Time: 02/09/15 11:48 AM  Result Value Ref Range Status   MRSA by PCR NEGATIVE NEGATIVE Final     Scheduled Meds: . docusate sodium  100 mg Oral BID  . dronabinol  10 mg Oral BID  . ferrous sulfate  325 mg Oral Daily  . heparin  5,000 Units Subcutaneous 3 times per day  . insulin aspart  0-20 Units Subcutaneous TID WC  . insulin aspart  0-5 Units Subcutaneous QHS  . insulin detemir  15 Units Subcutaneous QHS  . pantoprazole  40 mg Oral BID  . polyethylene glycol  17 g Oral Daily  . pregabalin  150 mg Oral BID   Continuous Infusions: . sodium chloride 50 mL/hr at 02/11/15 2055

## 2015-02-12 NOTE — Progress Notes (Signed)
Pt appears anxious. Offered lavender aromatherapy and she accepted. 2 drops lavender eo placed on 2x2 gauze in paper medicine cup and given to pt @ 0800. Pt sleeping peacefully now.  Lind Guest, RN

## 2015-02-13 DIAGNOSIS — E1010 Type 1 diabetes mellitus with ketoacidosis without coma: Secondary | ICD-10-CM | POA: Insufficient documentation

## 2015-02-13 DIAGNOSIS — I10 Essential (primary) hypertension: Secondary | ICD-10-CM

## 2015-02-13 DIAGNOSIS — E1310 Other specified diabetes mellitus with ketoacidosis without coma: Secondary | ICD-10-CM

## 2015-02-13 LAB — GLUCOSE, CAPILLARY: Glucose-Capillary: 151 mg/dL — ABNORMAL HIGH (ref 65–99)

## 2015-02-13 NOTE — Discharge Summary (Signed)
Physician Discharge Summary  [name] VZD:[id number] DOB: Aug 20, 1965 DOA: 02/08/2015  PCP: Barbette Merino, MD  Admit date: 02/08/2015 Discharge date: 02/13/2015  Recommendations for Outpatient Follow-up:  1. Continue medications as per prior to this admission 2. No changes in insulin regimen   Discharge Diagnoses:  Principal Problem:   DKA, type 1 Active Problems:   Hypertension   Nausea and vomiting   Agitation    Discharge Condition: stable   Diet recommendation: as tolerated   History of present illness:  49 year old female with past medical history of uncontrolled diabetes and related neuropathy and gastroparesis who presented to [facility] ED with nausea dan vomiting for past 2 days piror to this admission and poor oral intake. On admission, she was found to be in DKA and was started on insulin drip.   Hospital Course:   Assessment/Plan:    Principal Problem:  DKA, type 1 / High anion gap metabolic acidosis  - DKA criteria met on admission with CBG of 410 and glucose 489. CO2 12, AG 18. Urinalysis showed ketones with glucose of more than 1000 - Transferred out of SDU 8/19 - CBG's stable - Continue Levemir 15 units at bedtime along with SSI on discharge  - Pt tolerated regular diet well  Active Problems: Diabetes mellitus type 1, uncontrolled with gastroparesis and neuropathy - A1c in 09/2014 was 8.1 indicating uncontrolled DM - DM coordinator consulted and we appreciated their recommendations  - Of note, UDS positive for benzos and THC - Continue lyrica for neuropathy   Nausea and vomiting / Diabetic gastroparesis - Apparently allergic to Reglan so she was on erythromycin which we stopped 8/20 - Abd x ray this am with no acute findings  - Nausea better. No vomiting.   Iron deficiency anemia / Anemia of chronic kidney diease  - Continue ferrous sulfate supplementation   CKD stage 3 - Baseline creatinine from 09/2014 was 1.14 - Acute elevation in Cr on this  admission to 1.28 likely prerenal and reflective of GI losses - Cr normalized with IV fluids   Hyperkalemia - Corrected with insulin drip   DVT Prophylaxis  - Heparin subQ while pt in hospital    Code Status: Full.  Family Communication: Family not at the bedside    IV access:  Peripheral IV  Procedures and diagnostic studies:   Dg Abd Portable 1v 02/12/2015 No acute abnormality noted. Electronically Signed By: Inez Catalina M.D. On: 02/12/2015 10:18    Medical Consultants:  None   Other Consultants:  DM coordinator  IAnti-Infectives:   None     Signed:  Leisa Lenz, MD  [facility] 02/13/2015, 9:59 AM  Pager #: [phone]  Time spent in minutes: more than 30 minutes   Discharge Exam: Filed Vitals:   02/13/15 0500  BP: 103/59  Pulse: 77  Temp: 97.9 F (36.6 C)  Resp: 16   Filed Vitals:   02/12/15 2000 02/12/15 2348 02/13/15 0205 02/13/15 0500  BP: 84/43 105/57 106/55 103/59  Pulse: 81 79 78 77  Temp: 98 F (36.7 C)  98 F (36.7 C) 97.9 F (36.6 C)  TempSrc: Oral  Oral Oral  Resp: 18  16 16   Height:      Weight:      SpO2: 100%  100% 100%    General: Pt is alert, follows commands appropriately, not in acute distress Cardiovascular: Regular rate and rhythm, S1/S2 +, no murmurs Respiratory: Clear to auscultation bilaterally, no wheezing, no crackles, no rhonchi Abdominal: Soft, non tender,  non distended, bowel sounds +, no guarding Extremities: no edema, no cyanosis, pulses palpable bilaterally DP and PT Neuro: Grossly nonfocal  Discharge Instructions  Discharge Instructions    Call MD for:  persistant dizziness or light-headedness    Complete by:  As directed      Call MD for:  persistant nausea and vomiting    Complete by:  As directed      Call MD for:  severe uncontrolled pain    Complete by:  As directed      Diet - low sodium heart healthy    Complete by:  As directed      Increase activity  slowly    Complete by:  As directed             Medication List    STOP taking these medications        aspirin 81 MG EC tablet     doxycycline 100 MG capsule  Commonly known as:  VIBRAMYCIN     ondansetron 4 MG tablet  Commonly known as:  ZOFRAN     ondansetron 8 MG disintegrating tablet  Commonly known as:  ZOFRAN ODT      TAKE these medications        acetaminophen 325 MG tablet  Commonly known as:  TYLENOL  Take 650 mg by mouth every 6 (six) hours as needed for moderate pain (cramping).     dronabinol 10 MG capsule  Commonly known as:  MARINOL  Take 10 mg by mouth 2 (two) times daily.     ferrous sulfate 325 (65 FE) MG tablet  Take 325 mg by mouth daily.     furosemide 20 MG tablet  Commonly known as:  LASIX  Take 2 tablets (40 mg total) by mouth 2 (two) times daily.     insulin aspart 100 UNIT/ML injection  Commonly known as:  novoLOG  Inject 0-15 Units into the skin 3 (three) times daily with meals.     insulin detemir 100 UNIT/ML injection  Commonly known as:  LEVEMIR  Inject 0.15 mLs (15 Units total) into the skin at bedtime.     lisinopril 10 MG tablet  Commonly known as:  PRINIVIL,ZESTRIL  Take 10 mg by mouth at bedtime.     LORazepam 0.5 MG tablet  Commonly known as:  ATIVAN  Take 1 tablet by mouth 3 (three) times daily as needed for anxiety.     pantoprazole 40 MG tablet  Commonly known as:  PROTONIX  Take 40 mg by mouth 2 (two) times daily.     pregabalin 150 MG capsule  Commonly known as:  LYRICA  Take 1 capsule (150 mg total) by mouth 2 (two) times daily.     promethazine 25 MG suppository  Commonly known as:  PHENERGAN  Place 1 suppository (25 mg total) rectally every 6 (six) hours as needed for nausea or vomiting.           Follow-up Information    Follow up with [facility], MD. Schedule an appointment as soon as possible for a visit in 1 week.   Specialty:  Internal Medicine   Why:  Follow up appt after recent  hospitalization   Contact information:   [address] [place] [address] [phone]        The results of significant diagnostics from this hospitalization (including imaging, microbiology, ancillary and laboratory) are listed below for reference.    Significant Diagnostic Studies: Dg Abd Portable 1v  02/12/2015  CLINICAL DATA:  Abdominal pain for 1 day  EXAM: PORTABLE ABDOMEN - 1 VIEW  COMPARISON:  11/15/2014  FINDINGS: Scattered large and small bowel gas is noted. Changes consistent with prior cholecystectomy are seen. No bony abnormality is seen. No abnormal mass or abnormal calcifications are noted.  IMPRESSION: No acute abnormality noted.   Electronically Signed   By: Inez Catalina M.D.   On: 02/12/2015 10:18    Microbiology: Recent Results (from the past 240 hour(s))  MRSA PCR Screening     Status: None   Collection Time: 02/09/15 11:48 AM  Result Value Ref Range Status   MRSA by PCR NEGATIVE NEGATIVE Final    Comment:        The GeneXpert MRSA Assay (FDA approved for NASAL specimens only), is one component of a comprehensive MRSA colonization surveillance program. It is not intended to diagnose MRSA infection nor to guide or monitor treatment for MRSA infections.      Labs: Basic Metabolic Panel:  Recent Labs Lab 02/09/15 2050 02/09/15 2335 02/10/15 0416 02/10/15 0803 02/11/15 0500  NA 140 139 137 137 138  K 3.8 3.3* 3.5 4.1 3.6  CL 112* 114* 113* 115* 111  CO2 19* 19* 16* 15* 16*  GLUCOSE 114* 115* 214* 74 101*  BUN 13 12 10 8  <5*  CREATININE 0.90 0.85 1.03* 0.73 0.81  CALCIUM 8.5* 7.6* 7.5* 7.5* 8.4*   Liver Function Tests:  Recent Labs Lab 02/08/15 1908  AST 23  ALT 17  ALKPHOS 88  BILITOT 1.7*  PROT 7.2  ALBUMIN 3.7    Recent Labs Lab 02/08/15 1908  LIPASE 24   No results for input(s): AMMONIA in the last 168 hours. CBC:  Recent Labs Lab 02/08/15 1908 02/10/15 0416 02/11/15 0500  WBC 10.7* 9.0 8.0  NEUTROABS 9.8*  --    --   HGB 12.0 10.7* 11.8*  HCT 35.7* 32.4* 33.7*  MCV 90.2 92.3 87.3  PLT 266 260 264   Cardiac Enzymes:  Recent Labs Lab 02/09/15 0036  TROPONINI <0.03   BNP: BNP (last 3 results) No results for input(s): BNP in the last 8760 hours.  ProBNP (last 3 results) No results for input(s): PROBNP in the last 8760 hours.  CBG:  Recent Labs Lab 02/12/15 1152 02/12/15 1707 02/12/15 2117 02/12/15 2147 02/13/15 0917  GLUCAP 160* 125* 66 136* 151*

## 2015-02-13 NOTE — Progress Notes (Signed)
Pt evaluated by Dr. Charlies Silvers this morning and is discharged home. Pt given option of staying one more day if she needs to but feels she is okay to go today. Pt to follow up with her PCP in one week. Pt has no complaints at this time. Waiting her sister to pick her up.

## 2015-02-19 ENCOUNTER — Emergency Department (HOSPITAL_COMMUNITY): Payer: 59

## 2015-02-19 ENCOUNTER — Inpatient Hospital Stay (HOSPITAL_COMMUNITY)
Admission: EM | Admit: 2015-02-19 | Discharge: 2015-02-22 | DRG: 074 | Disposition: A | Discharge status: Home / Self Care | Payer: 59 | Attending: Internal Medicine | Admitting: Internal Medicine

## 2015-02-19 ENCOUNTER — Encounter (HOSPITAL_COMMUNITY): Payer: Self-pay

## 2015-02-19 DIAGNOSIS — E1043 Type 1 diabetes mellitus with diabetic autonomic (poly)neuropathy: Principal | ICD-10-CM | POA: Diagnosis present

## 2015-02-19 DIAGNOSIS — I10 Essential (primary) hypertension: Secondary | ICD-10-CM | POA: Diagnosis present

## 2015-02-19 DIAGNOSIS — Z8249 Family history of ischemic heart disease and other diseases of the circulatory system: Secondary | ICD-10-CM

## 2015-02-19 DIAGNOSIS — B373 Candidiasis of vulva and vagina: Secondary | ICD-10-CM | POA: Diagnosis present

## 2015-02-19 DIAGNOSIS — Z9049 Acquired absence of other specified parts of digestive tract: Secondary | ICD-10-CM | POA: Diagnosis present

## 2015-02-19 DIAGNOSIS — K3184 Gastroparesis: Secondary | ICD-10-CM | POA: Diagnosis present

## 2015-02-19 DIAGNOSIS — E876 Hypokalemia: Secondary | ICD-10-CM | POA: Diagnosis present

## 2015-02-19 DIAGNOSIS — R112 Nausea with vomiting, unspecified: Secondary | ICD-10-CM | POA: Diagnosis not present

## 2015-02-19 DIAGNOSIS — K5900 Constipation, unspecified: Secondary | ICD-10-CM | POA: Diagnosis present

## 2015-02-19 DIAGNOSIS — Z833 Family history of diabetes mellitus: Secondary | ICD-10-CM

## 2015-02-19 DIAGNOSIS — R1110 Vomiting, unspecified: Secondary | ICD-10-CM

## 2015-02-19 DIAGNOSIS — K219 Gastro-esophageal reflux disease without esophagitis: Secondary | ICD-10-CM | POA: Diagnosis present

## 2015-02-19 DIAGNOSIS — E860 Dehydration: Secondary | ICD-10-CM | POA: Diagnosis present

## 2015-02-19 DIAGNOSIS — Z794 Long term (current) use of insulin: Secondary | ICD-10-CM

## 2015-02-19 LAB — BASIC METABOLIC PANEL
ANION GAP: 12 (ref 5–15)
BUN: 16 mg/dL (ref 6–20)
CHLORIDE: 95 mmol/L — AB (ref 101–111)
CO2: 30 mmol/L (ref 22–32)
Calcium: 9.7 mg/dL (ref 8.9–10.3)
Creatinine, Ser: 1.13 mg/dL — ABNORMAL HIGH (ref 0.44–1.00)
GFR calc non Af Amer: 57 mL/min — ABNORMAL LOW (ref >60)
Glucose, Bld: 252 mg/dL — ABNORMAL HIGH (ref 65–99)
Potassium: 3.6 mmol/L (ref 3.5–5.1)
SODIUM: 137 mmol/L (ref 135–145)

## 2015-02-19 LAB — PREGNANCY, URINE: PREG TEST UR: NEGATIVE

## 2015-02-19 LAB — CBC WITH DIFFERENTIAL/PLATELET
BASOS PCT: 0 % (ref 0–1)
Basophils Absolute: 0 10*3/uL (ref 0.0–0.1)
Eosinophils Absolute: 0.1 10*3/uL (ref 0.0–0.7)
Eosinophils Relative: 1 % (ref 0–5)
HEMATOCRIT: 37.6 % (ref 36.0–46.0)
HEMOGLOBIN: 12.5 g/dL (ref 12.0–15.0)
LYMPHS ABS: 1.1 10*3/uL (ref 0.7–4.0)
Lymphocytes Relative: 12 % (ref 12–46)
MCH: 29.9 pg (ref 26.0–34.0)
MCHC: 33.2 g/dL (ref 30.0–36.0)
MCV: 90 fL (ref 78.0–100.0)
MONOS PCT: 8 % (ref 3–12)
Monocytes Absolute: 0.7 10*3/uL (ref 0.1–1.0)
NEUTROS ABS: 7.2 10*3/uL (ref 1.7–7.7)
NEUTROS PCT: 79 % — AB (ref 43–77)
Platelets: 363 10*3/uL (ref 150–400)
RBC: 4.18 MIL/uL (ref 3.87–5.11)
RDW: 13.3 % (ref 11.5–15.5)
WBC: 9 10*3/uL (ref 4.0–10.5)

## 2015-02-19 LAB — URINALYSIS, ROUTINE W REFLEX MICROSCOPIC
Bilirubin Urine: NEGATIVE
Glucose, UA: >1000 mg/dL — AB
HGB URINE DIPSTICK: NEGATIVE
Ketones, ur: 15 mg/dL — AB
LEUKOCYTES UA: NEGATIVE
Nitrite: NEGATIVE
PROTEIN: NEGATIVE mg/dL
SPECIFIC GRAVITY, URINE: 1.018 (ref 1.005–1.030)
UROBILINOGEN UA: 0.2 mg/dL (ref 0.0–1.0)
pH: 7 (ref 5.0–8.0)

## 2015-02-19 LAB — BLOOD GAS, VENOUS
ACID-BASE EXCESS: 5.2 mmol/L — AB (ref 0.0–2.0)
Bicarbonate: 29.9 mEq/L — ABNORMAL HIGH (ref 20.0–24.0)
FIO2: 0.21
O2 SAT: 43.3 %
PATIENT TEMPERATURE: 98.3
TCO2: 27.1 mmol/L (ref 0–100)
pCO2, Ven: 45.8 mmHg (ref 45.0–50.0)
pH, Ven: 7.43 — ABNORMAL HIGH (ref 7.250–7.300)

## 2015-02-19 LAB — I-STAT CHEM 8, ED
BUN: 15 mg/dL (ref 6–20)
CHLORIDE: 93 mmol/L — AB (ref 101–111)
Calcium, Ion: 1.16 mmol/L (ref 1.12–1.23)
Creatinine, Ser: 0.9 mg/dL (ref 0.44–1.00)
Glucose, Bld: 248 mg/dL — ABNORMAL HIGH (ref 65–99)
HEMATOCRIT: 40 % (ref 36.0–46.0)
Hemoglobin: 13.6 g/dL (ref 12.0–15.0)
POTASSIUM: 3.5 mmol/L (ref 3.5–5.1)
SODIUM: 136 mmol/L (ref 135–145)
TCO2: 26 mmol/L (ref 0–100)

## 2015-02-19 LAB — URINE MICROSCOPIC-ADD ON

## 2015-02-19 LAB — CBG MONITORING, ED: GLUCOSE-CAPILLARY: 155 mg/dL — AB (ref 65–99)

## 2015-02-19 MED ORDER — PROMETHAZINE HCL 25 MG/ML IJ SOLN
25.0000 mg | Freq: Once | INTRAMUSCULAR | Status: AC
  Administered 2015-02-19: 25 mg via INTRAVENOUS
  Filled 2015-02-19: qty 1

## 2015-02-19 MED ORDER — ONDANSETRON HCL 4 MG PO TABS
4.0000 mg | ORAL_TABLET | Freq: Once | ORAL | Status: AC
  Administered 2015-02-20: 4 mg via ORAL
  Filled 2015-02-19: qty 1

## 2015-02-19 MED ORDER — SODIUM CHLORIDE 0.9 % IV SOLN
1000.0000 mL | Freq: Once | INTRAVENOUS | Status: AC
  Administered 2015-02-19: 1000 mL via INTRAVENOUS

## 2015-02-19 MED ORDER — SODIUM CHLORIDE 0.9 % IV SOLN
1000.0000 mL | INTRAVENOUS | Status: DC
  Administered 2015-02-19 – 2015-02-20 (×2): 1000 mL via INTRAVENOUS

## 2015-02-19 MED ORDER — ONDANSETRON HCL 4 MG/2ML IJ SOLN
4.0000 mg | Freq: Once | INTRAMUSCULAR | Status: AC
  Administered 2015-02-19: 4 mg via INTRAVENOUS
  Filled 2015-02-19: qty 2

## 2015-02-19 MED ORDER — LORAZEPAM 2 MG/ML IJ SOLN
0.5000 mg | Freq: Once | INTRAMUSCULAR | Status: AC
  Administered 2015-02-19: 0.5 mg via INTRAVENOUS
  Filled 2015-02-19: qty 1

## 2015-02-19 NOTE — ED Notes (Signed)
Bed: UO15 Expected date:  Expected time:  Means of arrival:  Comments: Ems-hyperglemia

## 2015-02-19 NOTE — ED Notes (Signed)
Pt in XRAY 

## 2015-02-19 NOTE — ED Notes (Signed)
Pt info me she is not able to give urine sample at this time

## 2015-02-19 NOTE — ED Notes (Signed)
Pt aware of need for urine sample. Pt unable to void at this time. Will continue to monitor.

## 2015-02-19 NOTE — ED Provider Notes (Signed)
CSN: [id number]     Arrival date & time 02/19/15  1809 History   First MD Initiated Contact with Patient 02/19/15 1812     Chief Complaint  Patient presents with  . Hyperglycemia     (Consider location/radiation/quality/duration/timing/severity/associated sxs/prior Treatment) The history is provided by the patient. The history is limited by the condition of the patient.     Patient is a 49 year old female, with pertinent medical history of ADD and, gastroparesis, constipation, GERD, anxiety, hypertension, who presents to [facility] ED with complaints of hyperglycemia and gastroparesis with associated nausea, vomiting and 2 days of constipation. She was discharged 6 days ago for DKA, states that her sugars have been up and down since she has been home, she reports her highest was 450. She states that she is extremely anxious and uses benzos daily. She has had some persistent nausea and 3 episodes of vomiting today which made her become more anxious and she subsequently called EMS to be brought to the ER.   She has generalized abdominal pain, rated 7 on a 10 and feels distended.   She denies fever, chills, diarrhea, chest pain, shortness of breath, urinary symptoms. She denies any other drug use other than prescribed medications.  Past Medical History  Diagnosis Date  . Hypertension   . Cataract   . Anxiety   . Anemia   . GERD (gastroesophageal reflux disease)   . Constipation 04/03/2012  . Complication of anesthesia   . PONV (postoperative nausea and vomiting)   . Asthma     IN CHILDHOOD  . Neuromuscular disorder     Diabetic neuropathy  . Gastroparesis   . Diabetic neuropathy   . History of blood product transfusion   . Diabetes mellitus     diagnosed age 37   Past Surgical History  Procedure Laterality Date  . Retinal detachment surgery    . Posterior vitrectomy and membrane peel-left eye  09/28/2002  . Posterior vitrectomy and membrane peel-right eye  03/16/2002  . Eye surgery     . Gailstones     Family History  Problem Relation Age of Onset  . Cystic fibrosis Mother   . Hypertension Father   . Diabetes Brother   . Hypertension Maternal Grandmother    Social History  Substance Use Topics  . Smoking status: Never Smoker   . Smokeless tobacco: Never Used  . Alcohol Use: No     Comment: none per patient August 2016   OB History    No data available     Review of Systems  Respiratory: Negative for cough, chest tightness, shortness of breath and wheezing.   Gastrointestinal: Positive for abdominal pain and abdominal distention. Negative for diarrhea, blood in stool and anal bleeding.  Genitourinary: Negative.   Musculoskeletal: Negative.   Skin: Negative.   Neurological: Negative for syncope and headaches.  Hematological: Negative.   Psychiatric/Behavioral: The patient is nervous/anxious.    Allergies  Anesthetics, amide; Penicillins; Buprenorphine hcl; Encainide; Metoclopramide; and Morphine and related  Home Medications   Prior to Admission medications   Medication Sig Start Date End Date Taking? Authorizing Provider  doxycycline (VIBRAMYCIN) 100 MG capsule Take 100 mg by mouth 2 (two) times daily. ABT Start Date 02/06/15 & End Date 02/19/15. 02/02/15  Yes Historical Provider, MD  dronabinol (MARINOL) 10 MG capsule Take 10 mg by mouth 2 (two) times daily. 02/04/15  Yes Historical Provider, MD  ferrous sulfate 325 (65 FE) MG tablet Take 325 mg by mouth daily.  Yes Historical Provider, MD  furosemide (LASIX) 20 MG tablet Take 2 tablets (40 mg total) by mouth 2 (two) times daily. 11/07/12  Yes Charlynne Cousins, MD  insulin aspart (NOVOLOG) 100 UNIT/ML injection Inject 0-15 Units into the skin 3 (three) times daily with meals. Patient taking differently: Inject 0-15 Units into the skin 3 (three) times daily with meals. 6-15 units three times daily with food.  Per sliding scale 11/28/12  Yes Nishant Dhungel, MD  insulin detemir (LEVEMIR) 100 UNIT/ML  injection Inject 0.15 mLs (15 Units total) into the skin at bedtime. 02/17/13  Yes Robbie Lis, MD  lisinopril (PRINIVIL,ZESTRIL) 10 MG tablet Take 10 mg by mouth at bedtime.    Yes Historical Provider, MD  LORazepam (ATIVAN) 0.5 MG tablet Take 1 tablet by mouth 3 (three) times daily as needed for anxiety.  11/29/14  Yes Historical Provider, MD  pantoprazole (PROTONIX) 40 MG tablet Take 40 mg by mouth 2 (two) times daily. 01/04/15  Yes Historical Provider, MD  pregabalin (LYRICA) 150 MG capsule Take 1 capsule (150 mg total) by mouth 2 (two) times daily. 02/17/13  Yes Robbie Lis, MD  acetaminophen (TYLENOL) 325 MG tablet Take 650 mg by mouth every 6 (six) hours as needed for moderate pain (cramping).     Historical Provider, MD  promethazine (PHENERGAN) 25 MG suppository Place 1 suppository (25 mg total) rectally every 6 (six) hours as needed for nausea or vomiting. Patient not taking: Reported on 11/25/2014 11/03/14   Mercedes Camprubi-Soms, PA-C   BP 106/52 mmHg  Pulse 80  Temp(Src) 97.8 F (36.6 C) (Oral)  Resp 18  Ht 5\' 2"  (1.575 m)  Wt 143 lb 1.6 oz (64.91 kg)  BMI 26.17 kg/m2  SpO2 100%  LMP 02/08/2015 Physical Exam  Constitutional: She is oriented to person, place, and time. She appears well-developed and well-nourished. No distress.  HENT:  Head: Normocephalic and atraumatic.  Nose: Nose normal.  Mouth/Throat: Oropharynx is clear and moist. No oropharyngeal exudate.  Eyes: Conjunctivae and EOM are normal. Pupils are equal, round, and reactive to light. Right eye exhibits no discharge. Left eye exhibits no discharge. No scleral icterus.  Neck: Normal range of motion. No JVD present. No tracheal deviation present. No thyromegaly present.  Cardiovascular: Normal rate, regular rhythm, normal heart sounds and intact distal pulses.  Exam reveals no gallop and no friction rub.   No murmur heard. Pulmonary/Chest: Effort normal and breath sounds normal. No respiratory distress. She has no  wheezes. She has no rales. She exhibits no tenderness.  Abdominal: Soft. She exhibits no mass. There is no rebound and no guarding.  Generalized abdominal tenderness to palpation with mild distention and decreased bowel sounds throughout all 4 quadrants, no guarding no rebound  Musculoskeletal: Normal range of motion. She exhibits no edema or tenderness.  Lymphadenopathy:    She has no cervical adenopathy.  Neurological: She is alert and oriented to person, place, and time. She has normal reflexes. No cranial nerve deficit. She exhibits normal muscle tone. Coordination normal.  Skin: Skin is warm and dry. No rash noted. She is not diaphoretic. No erythema. No pallor.  Psychiatric: She has a normal mood and affect. Her behavior is normal. Judgment and thought content normal.  Nursing note and vitals reviewed.   ED Course  Procedures (including critical care time) Labs Review Labs Reviewed  BASIC METABOLIC PANEL - Abnormal; Notable for the following:    Chloride 95 (*)    Glucose, Bld 252 (*)  Creatinine, Ser 1.13 (*)    GFR calc non Af Amer 57 (*)    All other components within normal limits  CBC WITH DIFFERENTIAL/PLATELET - Abnormal; Notable for the following:    Neutrophils Relative % 79 (*)    All other components within normal limits  URINALYSIS, ROUTINE W REFLEX MICROSCOPIC (NOT AT [facility]) - Abnormal; Notable for the following:    APPearance CLOUDY (*)    Glucose, UA >1000 (*)    Ketones, ur 15 (*)    All other components within normal limits  BLOOD GAS, VENOUS - Abnormal; Notable for the following:    pH, Ven 7.430 (*)    Bicarbonate 29.9 (*)    Acid-Base Excess 5.2 (*)    All other components within normal limits  URINE MICROSCOPIC-ADD ON - Abnormal; Notable for the following:    Squamous Epithelial / LPF FEW (*)    All other components within normal limits  BASIC METABOLIC PANEL - Abnormal; Notable for the following:    Sodium 134 (*)    Chloride 97 (*)    Glucose, Bld  310 (*)    Calcium 8.4 (*)    All other components within normal limits  MAGNESIUM - Abnormal; Notable for the following:    Magnesium 1.5 (*)    All other components within normal limits  COMPREHENSIVE METABOLIC PANEL - Abnormal; Notable for the following:    Potassium 3.3 (*)    Chloride 100 (*)    Glucose, Bld 155 (*)    Calcium 8.5 (*)    Total Protein 6.3 (*)    Albumin 3.2 (*)    All other components within normal limits  CBC - Abnormal; Notable for the following:    RBC 3.67 (*)    Hemoglobin 11.1 (*)    HCT 33.1 (*)    All other components within normal limits  CBC - Abnormal; Notable for the following:    RBC 3.73 (*)    Hemoglobin 11.3 (*)    HCT 33.7 (*)    All other components within normal limits  GLUCOSE, CAPILLARY - Abnormal; Notable for the following:    Glucose-Capillary 245 (*)    All other components within normal limits  GLUCOSE, CAPILLARY - Abnormal; Notable for the following:    Glucose-Capillary 194 (*)    All other components within normal limits  GLUCOSE, CAPILLARY - Abnormal; Notable for the following:    Glucose-Capillary 160 (*)    All other components within normal limits  GLUCOSE, CAPILLARY - Abnormal; Notable for the following:    Glucose-Capillary 326 (*)    All other components within normal limits  GLUCOSE, CAPILLARY - Abnormal; Notable for the following:    Glucose-Capillary 228 (*)    All other components within normal limits  GLUCOSE, CAPILLARY - Abnormal; Notable for the following:    Glucose-Capillary 37 (*)    All other components within normal limits  GLUCOSE, CAPILLARY - Abnormal; Notable for the following:    Glucose-Capillary 37 (*)    All other components within normal limits  GLUCOSE, CAPILLARY - Abnormal; Notable for the following:    Glucose-Capillary 54 (*)    All other components within normal limits  GLUCOSE, CAPILLARY - Abnormal; Notable for the following:    Glucose-Capillary 151 (*)    All other components within  normal limits  I-STAT CHEM 8, ED - Abnormal; Notable for the following:    Chloride 93 (*)    Glucose, Bld 248 (*)  All other components within normal limits  CBG MONITORING, ED - Abnormal; Notable for the following:    Glucose-Capillary 155 (*)    All other components within normal limits  PREGNANCY, URINE  PHOSPHORUS  TSH  TROPONIN I  TROPONIN I  TROPONIN I  HEMOGLOBIN F5D  BASIC METABOLIC PANEL  MAGNESIUM    Imaging Review Dg Abd Acute W/chest  02/19/2015   CLINICAL DATA:  Nausea and vomiting. Generalized abdominal pain today. Abdominal distention. History of gastric paresis. Diabetes.  EXAM: DG ABDOMEN ACUTE W/ 1V CHEST  COMPARISON:  Abdomen 02/12/2015. Chest 11/15/2014. CT abdomen and pelvis 11/15/2014.  FINDINGS: Normal heart size and pulmonary vascularity. No focal airspace disease or consolidation in the lungs. No blunting of costophrenic angles. No pneumothorax. Mediastinal contours appear intact.  Stool-filled colon. No small or large bowel distention. No free intra-abdominal air. No abnormal air-fluid levels. No radiopaque stones. Visualized bones appear intact. Surgical clips in the right upper quadrant.  IMPRESSION: No evidence of active pulmonary disease. Normal nonobstructive bowel gas pattern. Stool-filled colon.   Electronically Signed   By: Lucienne Capers M.D.   On: 02/19/2015 20:47   I have personally reviewed and evaluated these images and lab results as part of my medical decision-making.   EKG Interpretation None      MDM   Final diagnoses:  Gastroparesis  Intractable vomiting with nausea, vomiting of unspecified type    Pt with hyperglycemia, N, V, abdominal pain - pt is alert and oriented, no active vomiting currently.  Patient does appear anxious, is asking for benzos.    Initial CBG was 252, pt w/o AG, VBG shows no acidosis  I have been unable to control her nausea with zofran, ativan and phenergan - the chart documents an allergy to reglan, so  it was not ordered. Acute abdomen serious shows stool-filled colon, otherwise unremarkable.  She will be admitted for gastroparesis, N/V     Delsa Grana, PA-C 02/21/15 3220  Forde Dandy, MD 02/21/15 [phone]

## 2015-02-19 NOTE — ED Notes (Signed)
Pt aware that we are awaiting phenergan from pharmacy

## 2015-02-19 NOTE — ED Notes (Signed)
RN Ailene Ravel assisted patient on to bedpan. Pt had a small hard BM and large urine output.

## 2015-02-19 NOTE — ED Notes (Signed)
I was unable to obtain lab from IV. Phlebotomy notified.

## 2015-02-19 NOTE — ED Notes (Signed)
She noted her blood sugar to be high today (in the 400's); also has vomited a few times today.  She arrives in no distress.  She states she has hx of gastroparesis.

## 2015-02-20 ENCOUNTER — Encounter (HOSPITAL_COMMUNITY): Payer: Self-pay | Admitting: Internal Medicine

## 2015-02-20 ENCOUNTER — Other Ambulatory Visit: Payer: Self-pay

## 2015-02-20 DIAGNOSIS — E1069 Type 1 diabetes mellitus with other specified complication: Secondary | ICD-10-CM | POA: Diagnosis not present

## 2015-02-20 DIAGNOSIS — B373 Candidiasis of vulva and vagina: Secondary | ICD-10-CM | POA: Diagnosis present

## 2015-02-20 DIAGNOSIS — E860 Dehydration: Secondary | ICD-10-CM | POA: Diagnosis present

## 2015-02-20 DIAGNOSIS — K3184 Gastroparesis: Secondary | ICD-10-CM | POA: Diagnosis present

## 2015-02-20 DIAGNOSIS — I10 Essential (primary) hypertension: Secondary | ICD-10-CM

## 2015-02-20 DIAGNOSIS — I9589 Other hypotension: Secondary | ICD-10-CM | POA: Diagnosis not present

## 2015-02-20 DIAGNOSIS — E1143 Type 2 diabetes mellitus with diabetic autonomic (poly)neuropathy: Secondary | ICD-10-CM | POA: Diagnosis not present

## 2015-02-20 DIAGNOSIS — K219 Gastro-esophageal reflux disease without esophagitis: Secondary | ICD-10-CM | POA: Diagnosis present

## 2015-02-20 DIAGNOSIS — Z833 Family history of diabetes mellitus: Secondary | ICD-10-CM | POA: Diagnosis not present

## 2015-02-20 DIAGNOSIS — E876 Hypokalemia: Secondary | ICD-10-CM | POA: Diagnosis present

## 2015-02-20 DIAGNOSIS — Z8249 Family history of ischemic heart disease and other diseases of the circulatory system: Secondary | ICD-10-CM | POA: Diagnosis not present

## 2015-02-20 DIAGNOSIS — R1110 Vomiting, unspecified: Secondary | ICD-10-CM

## 2015-02-20 DIAGNOSIS — E1043 Type 1 diabetes mellitus with diabetic autonomic (poly)neuropathy: Secondary | ICD-10-CM | POA: Diagnosis present

## 2015-02-20 DIAGNOSIS — R112 Nausea with vomiting, unspecified: Secondary | ICD-10-CM | POA: Diagnosis present

## 2015-02-20 DIAGNOSIS — K5900 Constipation, unspecified: Secondary | ICD-10-CM | POA: Diagnosis present

## 2015-02-20 DIAGNOSIS — Z9049 Acquired absence of other specified parts of digestive tract: Secondary | ICD-10-CM | POA: Diagnosis present

## 2015-02-20 DIAGNOSIS — Z794 Long term (current) use of insulin: Secondary | ICD-10-CM | POA: Diagnosis not present

## 2015-02-20 LAB — COMPREHENSIVE METABOLIC PANEL
ALK PHOS: 67 U/L (ref 38–126)
ALT: 15 U/L (ref 14–54)
ANION GAP: 5 (ref 5–15)
AST: 18 U/L (ref 15–41)
Albumin: 3.2 g/dL — ABNORMAL LOW (ref 3.5–5.0)
BUN: 10 mg/dL (ref 6–20)
CALCIUM: 8.5 mg/dL — AB (ref 8.9–10.3)
CHLORIDE: 100 mmol/L — AB (ref 101–111)
CO2: 30 mmol/L (ref 22–32)
CREATININE: 0.91 mg/dL (ref 0.44–1.00)
Glucose, Bld: 155 mg/dL — ABNORMAL HIGH (ref 65–99)
Potassium: 3.3 mmol/L — ABNORMAL LOW (ref 3.5–5.1)
SODIUM: 135 mmol/L (ref 135–145)
Total Bilirubin: 0.7 mg/dL (ref 0.3–1.2)
Total Protein: 6.3 g/dL — ABNORMAL LOW (ref 6.5–8.1)

## 2015-02-20 LAB — BASIC METABOLIC PANEL
ANION GAP: 10 (ref 5–15)
BUN: 12 mg/dL (ref 6–20)
CALCIUM: 8.4 mg/dL — AB (ref 8.9–10.3)
CO2: 27 mmol/L (ref 22–32)
Chloride: 97 mmol/L — ABNORMAL LOW (ref 101–111)
Creatinine, Ser: 0.9 mg/dL (ref 0.44–1.00)
GFR calc Af Amer: >60 mL/min (ref >60)
Glucose, Bld: 310 mg/dL — ABNORMAL HIGH (ref 65–99)
POTASSIUM: 3.6 mmol/L (ref 3.5–5.1)
SODIUM: 134 mmol/L — AB (ref 135–145)

## 2015-02-20 LAB — CBC
HCT: 33.1 % — ABNORMAL LOW (ref 36.0–46.0)
HEMATOCRIT: 33.7 % — AB (ref 36.0–46.0)
HEMOGLOBIN: 11.3 g/dL — AB (ref 12.0–15.0)
HEMOGLOBIN: 11.1 g/dL — AB (ref 12.0–15.0)
MCH: 30.3 pg (ref 26.0–34.0)
MCH: 30.2 pg (ref 26.0–34.0)
MCHC: 33.5 g/dL (ref 30.0–36.0)
MCHC: 33.5 g/dL (ref 30.0–36.0)
MCV: 90.3 fL (ref 78.0–100.0)
MCV: 90.2 fL (ref 78.0–100.0)
PLATELETS: 328 10*3/uL (ref 150–400)
Platelets: 313 10*3/uL (ref 150–400)
RBC: 3.73 MIL/uL — ABNORMAL LOW (ref 3.87–5.11)
RBC: 3.67 MIL/uL — AB (ref 3.87–5.11)
RDW: 13.4 % (ref 11.5–15.5)
RDW: 13.3 % (ref 11.5–15.5)
WBC: 4.9 10*3/uL (ref 4.0–10.5)
WBC: 4.5 10*3/uL (ref 4.0–10.5)

## 2015-02-20 LAB — GLUCOSE, CAPILLARY
GLUCOSE-CAPILLARY: 245 mg/dL — AB (ref 65–99)
GLUCOSE-CAPILLARY: 194 mg/dL — AB (ref 65–99)
GLUCOSE-CAPILLARY: 326 mg/dL — AB (ref 65–99)
Glucose-Capillary: 160 mg/dL — ABNORMAL HIGH (ref 65–99)

## 2015-02-20 LAB — PHOSPHORUS: Phosphorus: 2.5 mg/dL (ref 2.5–4.6)

## 2015-02-20 LAB — TROPONIN I: Troponin I: <0.03 ng/mL (ref <0.031)

## 2015-02-20 LAB — TSH: TSH: 0.74 u[IU]/mL (ref 0.350–4.500)

## 2015-02-20 LAB — MAGNESIUM: MAGNESIUM: 1.5 mg/dL — AB (ref 1.7–2.4)

## 2015-02-20 MED ORDER — INSULIN ASPART 100 UNIT/ML ~~LOC~~ SOLN
0.0000 [IU] | SUBCUTANEOUS | Status: DC
  Administered 2015-02-20: 2 [IU] via SUBCUTANEOUS
  Administered 2015-02-20: 3 [IU] via SUBCUTANEOUS
  Administered 2015-02-20: 7 [IU] via SUBCUTANEOUS
  Administered 2015-02-20 (×2): 2 [IU] via SUBCUTANEOUS
  Administered 2015-02-21: 3 [IU] via SUBCUTANEOUS
  Administered 2015-02-21: 7 [IU] via SUBCUTANEOUS
  Administered 2015-02-21 (×2): 1 [IU] via SUBCUTANEOUS
  Administered 2015-02-22: 7 [IU] via SUBCUTANEOUS
  Administered 2015-02-22: 3 [IU] via SUBCUTANEOUS
  Administered 2015-02-22: 2 [IU] via SUBCUTANEOUS

## 2015-02-20 MED ORDER — DOXYCYCLINE HYCLATE 100 MG IV SOLR
100.0000 mg | Freq: Two times a day (BID) | INTRAVENOUS | Status: DC
  Administered 2015-02-20 (×2): 100 mg via INTRAVENOUS
  Filled 2015-02-20 (×3): qty 100

## 2015-02-20 MED ORDER — PANTOPRAZOLE SODIUM 40 MG PO TBEC
40.0000 mg | DELAYED_RELEASE_TABLET | Freq: Two times a day (BID) | ORAL | Status: DC
  Administered 2015-02-20: 40 mg via ORAL
  Filled 2015-02-20: qty 1

## 2015-02-20 MED ORDER — DRONABINOL 5 MG PO CAPS
10.0000 mg | ORAL_CAPSULE | Freq: Two times a day (BID) | ORAL | Status: DC
  Administered 2015-02-20 – 2015-02-22 (×6): 10 mg via ORAL
  Filled 2015-02-20 (×6): qty 2

## 2015-02-20 MED ORDER — ONDANSETRON HCL 4 MG PO TABS: 4.0000 mg | ORAL_TABLET | Freq: Four times a day (QID) | ORAL | Status: DC | PRN

## 2015-02-20 MED ORDER — POTASSIUM CHLORIDE 10 MEQ/100ML IV SOLN
10.0000 meq | INTRAVENOUS | Status: AC
  Administered 2015-02-20 (×3): 10 meq via INTRAVENOUS
  Filled 2015-02-20 (×5): qty 100

## 2015-02-20 MED ORDER — BISACODYL 5 MG PO TBEC
5.0000 mg | DELAYED_RELEASE_TABLET | Freq: Once | ORAL | Status: AC
  Administered 2015-02-20: 5 mg via ORAL
  Filled 2015-02-20: qty 1

## 2015-02-20 MED ORDER — PROMETHAZINE HCL 25 MG/ML IJ SOLN
25.0000 mg | INTRAMUSCULAR | Status: AC
  Administered 2015-02-20: 25 mg via INTRAVENOUS
  Filled 2015-02-20: qty 1

## 2015-02-20 MED ORDER — PANTOPRAZOLE SODIUM 40 MG IV SOLR
40.0000 mg | Freq: Two times a day (BID) | INTRAVENOUS | Status: DC
  Administered 2015-02-20 – 2015-02-22 (×5): 40 mg via INTRAVENOUS
  Filled 2015-02-20 (×6): qty 40

## 2015-02-20 MED ORDER — ACETAMINOPHEN 325 MG PO TABS: 650.0000 mg | ORAL_TABLET | Freq: Four times a day (QID) | ORAL | Status: DC | PRN

## 2015-02-20 MED ORDER — PREGABALIN 75 MG PO CAPS
150.0000 mg | ORAL_CAPSULE | Freq: Two times a day (BID) | ORAL | Status: DC
  Administered 2015-02-20 – 2015-02-22 (×6): 150 mg via ORAL
  Filled 2015-02-20 (×6): qty 2

## 2015-02-20 MED ORDER — SODIUM CHLORIDE 0.9 % IJ SOLN
3.0000 mL | Freq: Two times a day (BID) | INTRAMUSCULAR | Status: DC
  Administered 2015-02-20 – 2015-02-22 (×5): 3 mL via INTRAVENOUS

## 2015-02-20 MED ORDER — ENOXAPARIN SODIUM 40 MG/0.4ML ~~LOC~~ SOLN
40.0000 mg | SUBCUTANEOUS | Status: DC
  Administered 2015-02-20 – 2015-02-21 (×2): 40 mg via SUBCUTANEOUS
  Filled 2015-02-20 (×3): qty 0.4

## 2015-02-20 MED ORDER — HYDROCODONE-ACETAMINOPHEN 5-325 MG PO TABS: 1.0000 | ORAL_TABLET | ORAL | Status: DC | PRN

## 2015-02-20 MED ORDER — ONDANSETRON HCL 4 MG/2ML IJ SOLN
4.0000 mg | Freq: Four times a day (QID) | INTRAMUSCULAR | Status: DC | PRN
  Administered 2015-02-20: 4 mg via INTRAVENOUS
  Filled 2015-02-20: qty 2

## 2015-02-20 MED ORDER — SODIUM CHLORIDE 0.9 % IV SOLN
250.0000 mg | Freq: Four times a day (QID) | INTRAVENOUS | Status: DC
  Administered 2015-02-20 – 2015-02-21 (×4): 250 mg via INTRAVENOUS
  Filled 2015-02-20 (×5): qty 5

## 2015-02-20 MED ORDER — LORAZEPAM 0.5 MG PO TABS
0.5000 mg | ORAL_TABLET | Freq: Three times a day (TID) | ORAL | Status: DC | PRN
  Administered 2015-02-20: 0.5 mg via ORAL
  Filled 2015-02-20: qty 1

## 2015-02-20 MED ORDER — PROMETHAZINE HCL 25 MG/ML IJ SOLN: 12.5000 mg | Freq: Four times a day (QID) | INTRAMUSCULAR | Status: DC | PRN

## 2015-02-20 MED ORDER — ACETAMINOPHEN 650 MG RE SUPP: 650.0000 mg | Freq: Four times a day (QID) | RECTAL | Status: DC | PRN

## 2015-02-20 MED ORDER — DOCUSATE SODIUM 100 MG PO CAPS
100.0000 mg | ORAL_CAPSULE | Freq: Two times a day (BID) | ORAL | Status: DC
  Administered 2015-02-20: 100 mg via ORAL
  Filled 2015-02-20 (×7): qty 1

## 2015-02-20 MED ORDER — PROMETHAZINE HCL 25 MG/ML IJ SOLN: 12.5000 mg | Freq: Four times a day (QID) | INTRAMUSCULAR | Status: DC

## 2015-02-20 MED ORDER — FERROUS SULFATE 325 (65 FE) MG PO TABS
325.0000 mg | ORAL_TABLET | Freq: Every day | ORAL | Status: DC
  Filled 2015-02-20 (×2): qty 1

## 2015-02-20 MED ORDER — POLYETHYLENE GLYCOL 3350 17 G PO PACK: 17.0000 g | PACK | Freq: Every day | ORAL | Status: DC | PRN

## 2015-02-20 MED ORDER — DEXTROSE-NACL 5-0.45 % IV SOLN
INTRAVENOUS | Status: AC
  Administered 2015-02-20: 04:00:00 via INTRAVENOUS

## 2015-02-20 MED ORDER — SODIUM CHLORIDE 0.9 % IV SOLN
INTRAVENOUS | Status: AC
  Administered 2015-02-20: 10:00:00 via INTRAVENOUS

## 2015-02-20 MED ORDER — MAGNESIUM SULFATE 2 GM/50ML IV SOLN
2.0000 g | Freq: Once | INTRAVENOUS | Status: AC
  Administered 2015-02-20: 2 g via INTRAVENOUS
  Filled 2015-02-20: qty 50

## 2015-02-20 MED ORDER — LORAZEPAM 0.5 MG PO TABS
0.5000 mg | ORAL_TABLET | Freq: Three times a day (TID) | ORAL | Status: DC
  Administered 2015-02-20 – 2015-02-22 (×6): 0.5 mg via ORAL
  Filled 2015-02-20 (×6): qty 1

## 2015-02-20 MED ORDER — INSULIN DETEMIR 100 UNIT/ML ~~LOC~~ SOLN
15.0000 [IU] | Freq: Every day | SUBCUTANEOUS | Status: DC
  Administered 2015-02-20 (×2): 15 [IU] via SUBCUTANEOUS
  Filled 2015-02-20 (×2): qty 0.15

## 2015-02-20 NOTE — Progress Notes (Addendum)
[facility] Progress Note   [name]  YBO:[id number]  DOB: 02-24-1966  DOA: 02/19/2015 PCP: Barbette Merino, MD  Brief narrative: [name] is a 49 y.o. female with diabetes mellitus, gastroparesis,diabetic neuropathy, hypertension gastroesophageal reflux disease, constipation who was hospitalized from 8/16 through 8/21 for DKA. She returns to the hospital for intractable nausea and vomiting. Also states that she was placed on doxycycline for 2 week course by [facility] due to an infection in her stomach. When she was last admitted she did not receive the doxycycline when she went home she did try to take it. She is not sure if the doxycycline was causing her nausea and vomiting   Subjective: She states she vomited a small amount of juice that she drank earlier this morning. Continues to be nauseated. No diarrhea or complaint of constipation. Abdomen is sore.  Assessment/Plan: Principal Problem:   Intractable nausea and vomiting/diabetic gastroparesis - DC regular diet, ferrous sulfate, and all other oral medications except for Lyrica -Placed on when necessary IV Phenergan and IV erythromycin routinely -Hold Lasix and hydrate (no heart failure per last echo in 2014) -Protonix 40 twice a day via IV -Continue Marinol which she states helps her nausea --Ativan via IV 3 times a day  Active Problems:   Type 1 diabetes mellitus -Continue Levemir and low-dose sliding scale insulin  Hypokalemia/ hypomagnesemia  -Replace via IV and recheck tomorrow-    Hypertension -BP low -Hold lisinopril  Note : doxycycline may have been used for H. pylori-she states she took one week of a 2 week course-will make it IV and continue it while she is here-will try to attempt to contact the physician who prescribed it tomorrow     Code Status:     Code Status Orders        Start     Ordered   02/20/15 0108  Full code   Continuous     02/20/15 0108     Disposition  Plan: Home when stable DVT prophylaxis: Lovenox Consultants: Procedures: Appt with PCP: Requested Antibiotics: Anti-infectives    Start     Dose/Rate Route Frequency Ordered Stop   02/20/15 1000  erythromycin 250 mg in sodium chloride 0.9 % 100 mL IVPB     250 mg 100 mL/hr over 60 Minutes Intravenous 4 times per day 02/20/15 0825     02/20/15 0915  doxycycline (VIBRAMYCIN) 100 mg in dextrose 5 % 250 mL IVPB     100 mg 125 mL/hr over 120 Minutes Intravenous Every 12 hours 02/20/15 0824        Objective: Filed Weights   02/20/15 0125  Weight: 64.91 kg (143 lb 1.6 oz)    Intake/Output Summary (Last 24 hours) at 02/20/15 1119 Last data filed at 02/20/15 0138  Gross per 24 hour  Intake    120 ml  Output      0 ml  Net    120 ml     Vitals Filed Vitals:   02/19/15 2230 02/19/15 2353 02/20/15 0125 02/20/15 0612  BP: 142/59 129/54 141/57 117/88  Pulse:  87 90 93  Temp:  98.7 F (37.1 C) 98.4 F (36.9 C) 98.4 F (36.9 C)  TempSrc:  Oral Oral Oral  Resp:  18 18 18   Height:   5\' 2"  (1.575 m)   Weight:   64.91 kg (143 lb 1.6 oz)   SpO2:  96% 99% 100%    Exam:  General:  Pt is alert, not in acute distress  HEENT: No icterus, No thrush, oral mucosa moist  Cardiovascular: regular rate and rhythm, S1/S2 No murmur  Respiratory: clear to auscultation bilaterally   Abdomen: Soft, +Bowel sounds, non tender, non distended, no guarding  MSK: No LE edema, cyanosis or clubbing  Data Reviewed: Basic Metabolic Panel:  Recent Labs Lab 02/19/15 1907 02/19/15 1920 02/20/15 0218 02/20/15 0745  NA 137 136 134* 135  K 3.6 3.5 3.6 3.3*  CL 95* 93* 97* 100*  CO2 30  --  27 30  GLUCOSE 252* 248* 310* 155*  BUN 16 15 12 10   CREATININE 1.13* 0.90 0.90 0.91  CALCIUM 9.7  --  8.4* 8.5*  MG  --   --   --  1.5*  PHOS  --   --   --  2.5   Liver Function Tests:  Recent Labs Lab 02/20/15 0745  AST 18  ALT 15  ALKPHOS 67  BILITOT 0.7  PROT 6.3*  ALBUMIN 3.2*   No  results for input(s): LIPASE, AMYLASE in the last 168 hours. No results for input(s): AMMONIA in the last 168 hours. CBC:  Recent Labs Lab 02/19/15 1907 02/19/15 1920 02/20/15 0218 02/20/15 0745  WBC 9.0  --  4.9 4.5  NEUTROABS 7.2  --   --   --   HGB 12.5 13.6 11.3* 11.1*  HCT 37.6 40.0 33.7* 33.1*  MCV 90.0  --  90.3 90.2  PLT 363  --  313 328   Cardiac Enzymes:  Recent Labs Lab 02/20/15 0218 02/20/15 0745  TROPONINI <0.03 <0.03   BNP (last 3 results) No results for input(s): BNP in the last 8760 hours.  ProBNP (last 3 results) No results for input(s): PROBNP in the last 8760 hours.  CBG:  Recent Labs Lab 02/19/15 2015 02/20/15 0117 02/20/15 0359  GLUCAP 155* 245* 194*    No results found for this or any previous visit (from the past 240 hour(s)).   Studies: Dg Abd Acute W/chest  02/19/2015   CLINICAL DATA:  Nausea and vomiting. Generalized abdominal pain today. Abdominal distention. History of gastric paresis. Diabetes.  EXAM: DG ABDOMEN ACUTE W/ 1V CHEST  COMPARISON:  Abdomen 02/12/2015. Chest 11/15/2014. CT abdomen and pelvis 11/15/2014.  FINDINGS: Normal heart size and pulmonary vascularity. No focal airspace disease or consolidation in the lungs. No blunting of costophrenic angles. No pneumothorax. Mediastinal contours appear intact.  Stool-filled colon. No small or large bowel distention. No free intra-abdominal air. No abnormal air-fluid levels. No radiopaque stones. Visualized bones appear intact. Surgical clips in the right upper quadrant.  IMPRESSION: No evidence of active pulmonary disease. Normal nonobstructive bowel gas pattern. Stool-filled colon.   Electronically Signed   By: Lucienne Capers M.D.   On: 02/19/2015 20:47    Scheduled Meds:  Scheduled Meds: . docusate sodium  100 mg Oral BID  . doxycycline (VIBRAMYCIN) IV  100 mg Intravenous Q12H  . dronabinol  10 mg Oral BID  . enoxaparin (LOVENOX) injection  40 mg Subcutaneous Q24H  .  erythromycin  250 mg Intravenous 4 times per day  . insulin aspart  0-9 Units Subcutaneous 6 times per day  . insulin detemir  15 Units Subcutaneous QHS  . LORazepam  0.5 mg Oral TID  . pantoprazole (PROTONIX) IV  40 mg Intravenous Q12H  . pregabalin  150 mg Oral BID  . sodium chloride  3 mL Intravenous Q12H   Continuous Infusions: . sodium chloride 1,000 mL (02/20/15 0946)    Time spent on care  of this patient: 20 min   [place], MD 02/20/2015, 11:19 AM  LOS: 0 days   [facility] Office  [phone] Pager - Text Page per [email or web address] If 7PM-7AM, please contact night-coverage [email or web address]

## 2015-02-20 NOTE — H&P (Signed)
PCP:  Barbette Merino, MD  GI at [facility] Dr. Yancey Flemings  Referring provider Lattie Haw PA   Chief Complaint: Intractable nausea and vomiting   HPI: [name] is a 49 y.o. female   has a past medical history of Hypertension; Cataract; Anxiety; Anemia; GERD (gastroesophageal reflux disease); Constipation (04/03/2012); Complication of anesthesia; PONV (postoperative nausea and vomiting); Asthma; Neuromuscular disorder; Gastroparesis; Diabetic neuropathy; History of blood product transfusion; and Diabetes mellitus.   Presented with  Patient known history of type I diabetes with recent admission for DKA discharge in 21st of August. She has known history of gastroparesis and recurrent nausea and vomiting which is intractable. In a past for her gastroparesis patient was on erythromycin but apparently this was discontinued. States that she was also treated with doxycycline for stomach infection. Since with discharged patient started to have increased blood sugars and repeated nausea and vomiting episodes starting today. Reports taking her Levemir yesterday. She reports not wanting to take a lot of coverage insulin due to not eating. Her BG continued to climb and got up to 400 at home. On presentation to emergency department no evidence of DKA was found and 9 Of 12 bicarbonate 30 Most recent blood sugar 155. Denies any chest pain.  Plan allergies showed stool filled colon  But no active cardiopulmonary disease  Hospitalist was called for admission for  intractable nausea and vomiting and obstipation  Review of Systems:    Pertinent positives include: abdominal pain, nausea, vomiting, contipation  Constitutional:  No weight loss, night sweats, Fevers, chills, fatigue, weight loss  HEENT:  No headaches, Difficulty swallowing,Tooth/dental problems,Sore throat,  No sneezing, itching, ear ache, nasal congestion, post nasal drip,  Cardio-vascular:  No chest pain, Orthopnea, PND, anasarca, dizziness,  palpitations.no Bilateral lower extremity swelling  GI:  No heartburn, indigestion,  diarrhea, change in bowel habits, loss of appetite, melena, blood in stool, hematemesis Resp:  no shortness of breath at rest. No dyspnea on exertion, No excess mucus, no productive cough, No non-productive cough, No coughing up of blood.No change in color of mucus.No wheezing. Skin:  no rash or lesions. No jaundice GU:  no dysuria, change in color of urine, no urgency or frequency. No straining to urinate.  No flank pain.  Musculoskeletal:  No joint pain or no joint swelling. No decreased range of motion. No back pain.  Psych:  No change in mood or affect. No depression or anxiety. No memory loss.  Neuro: no localizing neurological complaints, no tingling, no weakness, no double vision, no gait abnormality, no slurred speech, no confusion  Otherwise ROS are negative except for above, 10 systems were reviewed  Past Medical History: Past Medical History  Diagnosis Date  . Hypertension   . Cataract   . Anxiety   . Anemia   . GERD (gastroesophageal reflux disease)   . Constipation 04/03/2012  . Complication of anesthesia   . PONV (postoperative nausea and vomiting)   . Asthma     IN CHILDHOOD  . Neuromuscular disorder     Diabetic neuropathy  . Gastroparesis   . Diabetic neuropathy   . History of blood product transfusion   . Diabetes mellitus     diagnosed age 89   Past Surgical History  Procedure Laterality Date  . Retinal detachment surgery    . Posterior vitrectomy and membrane peel-left eye  09/28/2002  . Posterior vitrectomy and membrane peel-right eye  03/16/2002  . Eye surgery    . [employer]  Medications: Prior to Admission medications   Medication Sig Start Date End Date Taking? Authorizing Provider  doxycycline (VIBRAMYCIN) 100 MG capsule Take 100 mg by mouth 2 (two) times daily. ABT Start Date 02/06/15 & End Date 02/19/15. 02/02/15  Yes Historical Provider, MD  dronabinol  (MARINOL) 10 MG capsule Take 10 mg by mouth 2 (two) times daily. 02/04/15  Yes Historical Provider, MD  ferrous sulfate 325 (65 FE) MG tablet Take 325 mg by mouth daily.    Yes Historical Provider, MD  furosemide (LASIX) 20 MG tablet Take 2 tablets (40 mg total) by mouth 2 (two) times daily. 11/07/12  Yes Charlynne Cousins, MD  insulin aspart (NOVOLOG) 100 UNIT/ML injection Inject 0-15 Units into the skin 3 (three) times daily with meals. Patient taking differently: Inject 0-15 Units into the skin 3 (three) times daily with meals. 6-15 units three times daily with food.  Per sliding scale 11/28/12  Yes Nishant Dhungel, MD  insulin detemir (LEVEMIR) 100 UNIT/ML injection Inject 0.15 mLs (15 Units total) into the skin at bedtime. 02/17/13  Yes Robbie Lis, MD  lisinopril (PRINIVIL,ZESTRIL) 10 MG tablet Take 10 mg by mouth at bedtime.    Yes Historical Provider, MD  LORazepam (ATIVAN) 0.5 MG tablet Take 1 tablet by mouth 3 (three) times daily as needed for anxiety.  11/29/14  Yes Historical Provider, MD  pantoprazole (PROTONIX) 40 MG tablet Take 40 mg by mouth 2 (two) times daily. 01/04/15  Yes Historical Provider, MD  pregabalin (LYRICA) 150 MG capsule Take 1 capsule (150 mg total) by mouth 2 (two) times daily. 02/17/13  Yes Robbie Lis, MD  acetaminophen (TYLENOL) 325 MG tablet Take 650 mg by mouth every 6 (six) hours as needed for moderate pain (cramping).     Historical Provider, MD  promethazine (PHENERGAN) 25 MG suppository Place 1 suppository (25 mg total) rectally every 6 (six) hours as needed for nausea or vomiting. Patient not taking: Reported on 11/25/2014 11/03/14   Mercedes Camprubi-Soms, PA-C    Allergies:   Allergies  Allergen Reactions  . Anesthetics, Amide Nausea And Vomiting  . Penicillins Nausea And Vomiting  . Buprenorphine Hcl Rash  . Encainide Nausea And Vomiting  . Metoclopramide Rash  . Morphine And Related Rash    Social History:  Ambulatory   Independently  Lives at  home  With family     reports that she has never smoked. She has never used smokeless tobacco. She reports that she drinks alcohol. She reports that she does not use illicit drugs.    Family History: family history includes Cystic fibrosis in her mother; Diabetes in her brother; Hypertension in her father and maternal grandmother.    Physical Exam: Patient Vitals for the past 24 hrs:  BP Temp Temp src Pulse Resp SpO2  02/19/15 2353 (!) 129/54 mmHg 98.7 F (37.1 C) Oral 87 18 96 %  02/19/15 2230 142/59 mmHg - - - - -  02/19/15 2010 150/60 mmHg - - 92 16 99 %  02/19/15 1819 153/70 mmHg 98.3 F (36.8 C) Oral 95 14 98 %    1. General:  in No Acute distress 2. Psychological: Alert and  Oriented 3. Head/ENT:     Dry Mucous Membranes                          Head Non traumatic, neck supple  Normal Dentition 4. SKIN:  decreased Skin turgor,  Skin clean Dry and intact no rash 5. Heart: Regular rate and rhythm no Murmur, Rub or gallop 6. Lungs: Clear to auscultation bilaterally, no wheezes or crackles   7. Abdomen: Soft, non-tender, Non distended 8. Lower extremities: no clubbing, cyanosis, or edema 9. Neurologically Grossly intact, moving all 4 extremities equally 10. MSK: Normal range of motion  body mass index is unknown because there is no weight on file.   Labs on Admission:   Results for orders placed or performed during the hospital encounter of 02/19/15 (from the past 24 hour(s))  Basic metabolic panel     Status: Abnormal   Collection Time: 02/19/15  7:07 PM  Result Value Ref Range   Sodium 137 135 - 145 mmol/L   Potassium 3.6 3.5 - 5.1 mmol/L   Chloride 95 (L) 101 - 111 mmol/L   CO2 30 22 - 32 mmol/L   Glucose, Bld 252 (H) 65 - 99 mg/dL   BUN 16 6 - 20 mg/dL   Creatinine, Ser 1.13 (H) 0.44 - 1.00 mg/dL   Calcium 9.7 8.9 - 10.3 mg/dL   GFR calc non Af Amer 57 (L) >60 mL/min   GFR calc Af Amer >60 >60 mL/min   Anion gap 12 5 - 15  CBC with  Differential (PNL)     Status: Abnormal   Collection Time: 02/19/15  7:07 PM  Result Value Ref Range   WBC 9.0 4.0 - 10.5 K/uL   RBC 4.18 3.87 - 5.11 MIL/uL   Hemoglobin 12.5 12.0 - 15.0 g/dL   HCT 37.6 36.0 - 46.0 %   MCV 90.0 78.0 - 100.0 fL   MCH 29.9 26.0 - 34.0 pg   MCHC 33.2 30.0 - 36.0 g/dL   RDW 13.3 11.5 - 15.5 %   Platelets 363 150 - 400 K/uL   Neutrophils Relative % 79 (H) 43 - 77 %   Neutro Abs 7.2 1.7 - 7.7 K/uL   Lymphocytes Relative 12 12 - 46 %   Lymphs Abs 1.1 0.7 - 4.0 K/uL   Monocytes Relative 8 3 - 12 %   Monocytes Absolute 0.7 0.1 - 1.0 K/uL   Eosinophils Relative 1 0 - 5 %   Eosinophils Absolute 0.1 0.0 - 0.7 K/uL   Basophils Relative 0 0 - 1 %   Basophils Absolute 0.0 0.0 - 0.1 K/uL  Blood gas, venous     Status: Abnormal   Collection Time: 02/19/15  7:07 PM  Result Value Ref Range   FIO2 0.21    pH, Ven 7.430 (H) 7.250 - 7.300   pCO2, Ven 45.8 45.0 - 50.0 mmHg   pO2, Ven BELOW REPORTABLE RANGE 30.0 - 45.0 mmHg   Bicarbonate 29.9 (H) 20.0 - 24.0 mEq/L   TCO2 27.1 0 - 100 mmol/L   Acid-Base Excess 5.2 (H) 0.0 - 2.0 mmol/L   O2 Saturation 43.3 %   Patient temperature 98.3    Collection site VEIN    Drawn by [place]    Sample type VEIN   I-Stat Chem 8, ED  (not at [facility])     Status: Abnormal   Collection Time: 02/19/15  7:20 PM  Result Value Ref Range   Sodium 136 135 - 145 mmol/L   Potassium 3.5 3.5 - 5.1 mmol/L   Chloride 93 (L) 101 - 111 mmol/L   BUN 15 6 - 20 mg/dL   Creatinine, Ser 0.90 0.44 - 1.00  mg/dL   Glucose, Bld 248 (H) 65 - 99 mg/dL   Calcium, Ion 1.16 1.12 - 1.23 mmol/L   TCO2 26 0 - 100 mmol/L   Hemoglobin 13.6 12.0 - 15.0 g/dL   HCT 40.0 36.0 - 46.0 %  POC CBG, ED     Status: Abnormal   Collection Time: 02/19/15  8:15 PM  Result Value Ref Range   Glucose-Capillary 155 (H) 65 - 99 mg/dL  Urinalysis, Routine w reflex microscopic (not at [facility])     Status: Abnormal   Collection Time: 02/19/15  8:19 PM  Result  Value Ref Range   Color, Urine YELLOW YELLOW   APPearance CLOUDY (A) CLEAR   Specific Gravity, Urine 1.018 1.005 - 1.030   pH 7.0 5.0 - 8.0   Glucose, UA >1000 (A) NEGATIVE mg/dL   Hgb urine dipstick NEGATIVE NEGATIVE   Bilirubin Urine NEGATIVE NEGATIVE   Ketones, ur 15 (A) NEGATIVE mg/dL   Protein, ur NEGATIVE NEGATIVE mg/dL   Urobilinogen, UA 0.2 0.0 - 1.0 mg/dL   Nitrite NEGATIVE NEGATIVE   Leukocytes, UA NEGATIVE NEGATIVE  Pregnancy, urine     Status: None   Collection Time: 02/19/15  8:19 PM  Result Value Ref Range   Preg Test, Ur NEGATIVE NEGATIVE  Urine microscopic-add on     Status: Abnormal   Collection Time: 02/19/15  8:19 PM  Result Value Ref Range   Squamous Epithelial / LPF FEW (A) RARE   WBC, UA 0-2 <3 WBC/hpf    UA >1000 glucose cloudy no evidence of UTI  Lab Results  Component Value Date   HGBA1C 8.1* 10/21/2014    Estimated Creatinine Clearance: 66.6 mL/min (by C-G formula based on Cr of 0.9).  BNP (last 3 results) No results for input(s): PROBNP in the last 8760 hours.  Other results:  I have pearsonaly reviewed this: ECG REPORT not obtained   There were no vitals filed for this visit.   Cultures:    Component Value Date/Time   SDES BLOOD  LEFT HAND 11/25/2012 1435   SPECREQUEST NONE BOTTLES DRAWN AEROBIC ONLY 3CC 11/25/2012 1435   CULT NO GROWTH 5 DAYS 11/25/2012 1435   REPTSTATUS 12/02/2012 FINAL 11/25/2012 1435     Radiological Exams on Admission: Dg Abd Acute W/chest  02/19/2015   CLINICAL DATA:  Nausea and vomiting. Generalized abdominal pain today. Abdominal distention. History of gastric paresis. Diabetes.  EXAM: DG ABDOMEN ACUTE W/ 1V CHEST  COMPARISON:  Abdomen 02/12/2015. Chest 11/15/2014. CT abdomen and pelvis 11/15/2014.  FINDINGS: Normal heart size and pulmonary vascularity. No focal airspace disease or consolidation in the lungs. No blunting of costophrenic angles. No pneumothorax. Mediastinal contours appear intact.  Stool-filled  colon. No small or large bowel distention. No free intra-abdominal air. No abnormal air-fluid levels. No radiopaque stones. Visualized bones appear intact. Surgical clips in the right upper quadrant.  IMPRESSION: No evidence of active pulmonary disease. Normal nonobstructive bowel gas pattern. Stool-filled colon.   Electronically Signed   By: Lucienne Capers M.D.   On: 02/19/2015 20:47    Chart has been reviewed  Family not at  Bedside    Assessment/Plan   49 year old female with history of type 1 diabetes with gastroparesis and recurrent nausea and vomiting presents with nausea vomiting dehydration Carmen no evidence of DKA   Present on Admission:  . Nausea and vomiting - likely secondary to gastroparesis but patient states currently improving we'll continue treatment of Zofran. Given obstipation will order a bowel regimen to  see if that would help with nausea as well. Gait given patient's risk factors will cycle cardiac enzymes obtain serial EKG  . Type 1 diabetes mellitus - restart Levemir. Encourage by mouth intake if unable to tolerate will give D5 1/2 . Order sensitive sliding scale. Although no evidence of DKA currently will keep a close eye on the labs. Repeat serial BMEt. . Hypertension -given dehydration hold Lasix. Hold lisinopril for tonight given somewhat elevated creatinine but will be able to restart once renal function improves  . Diabetic gastroparesis patient has been followed at [facility] at this point states that she was recommended to take Marinol   obstipation  - will treat with bowel regimen  . Dehydration - will rehydrate monitor on telemetry as patient elected to develop electrolyte disbalance   Prophylaxis:  Lovenox   CODE STATUS:  FULL CODE as per patient    Disposition:  To home once workup is complete and patient is stable  Other plan as per orders.  I have spent a total of 55 min on this admission  Shagun Wordell 02/20/2015, 12:03 AM  [facility]  Pager [phone]   after 2 AM please page floor coverage PA If 7AM-7PM, please contact the day team taking care of the patient  [email or web address]  Password [email or web address]

## 2015-02-20 NOTE — ED Notes (Signed)
IV fluids disconnected for transport to the floor, needs to be restarted at 125cc/hr upon arrival to the floor

## 2015-02-20 NOTE — ED Notes (Signed)
Hospitalist at bedside 

## 2015-02-21 DIAGNOSIS — K3184 Gastroparesis: Secondary | ICD-10-CM

## 2015-02-21 LAB — BASIC METABOLIC PANEL
ANION GAP: 9 (ref 5–15)
BUN: 12 mg/dL (ref 6–20)
CHLORIDE: 105 mmol/L (ref 101–111)
CO2: 24 mmol/L (ref 22–32)
Calcium: 8.5 mg/dL — ABNORMAL LOW (ref 8.9–10.3)
Creatinine, Ser: 0.95 mg/dL (ref 0.44–1.00)
GFR calc non Af Amer: >60 mL/min (ref >60)
Glucose, Bld: 102 mg/dL — ABNORMAL HIGH (ref 65–99)
POTASSIUM: 4.2 mmol/L (ref 3.5–5.1)
SODIUM: 138 mmol/L (ref 135–145)

## 2015-02-21 LAB — GLUCOSE, CAPILLARY
GLUCOSE-CAPILLARY: 135 mg/dL — AB (ref 65–99)
GLUCOSE-CAPILLARY: 147 mg/dL — AB (ref 65–99)
GLUCOSE-CAPILLARY: 154 mg/dL — AB (ref 65–99)
GLUCOSE-CAPILLARY: 228 mg/dL — AB (ref 65–99)
GLUCOSE-CAPILLARY: 37 mg/dL — AB (ref 65–99)
GLUCOSE-CAPILLARY: 45 mg/dL — AB (ref 65–99)
GLUCOSE-CAPILLARY: 71 mg/dL (ref 65–99)
Glucose-Capillary: 151 mg/dL — ABNORMAL HIGH (ref 65–99)
Glucose-Capillary: 54 mg/dL — ABNORMAL LOW (ref 65–99)
Glucose-Capillary: 70 mg/dL (ref 65–99)
Glucose-Capillary: 316 mg/dL — ABNORMAL HIGH (ref 65–99)

## 2015-02-21 LAB — HEMOGLOBIN A1C
Hgb A1c MFr Bld: 8.9 % — ABNORMAL HIGH (ref 4.8–5.6)
Mean Plasma Glucose: 209 mg/dL

## 2015-02-21 LAB — MAGNESIUM: MAGNESIUM: 1.9 mg/dL (ref 1.7–2.4)

## 2015-02-21 MED ORDER — INSULIN DETEMIR 100 UNIT/ML ~~LOC~~ SOLN
10.0000 [IU] | Freq: Every day | SUBCUTANEOUS | Status: DC
  Administered 2015-02-21: 10 [IU] via SUBCUTANEOUS
  Filled 2015-02-21 (×2): qty 0.1

## 2015-02-21 MED ORDER — INSULIN DETEMIR 100 UNIT/ML ~~LOC~~ SOLN: 10.0000 [IU] | Freq: Every day | SUBCUTANEOUS | Status: DC

## 2015-02-21 MED ORDER — FUROSEMIDE 10 MG/ML IJ SOLN: 40.0000 mg | Freq: Once | INTRAMUSCULAR | Status: DC

## 2015-02-21 MED ORDER — LORAZEPAM 0.5 MG PO TABS: 0.5000 mg | ORAL_TABLET | Freq: Three times a day (TID) | ORAL | Status: DC

## 2015-02-21 MED ORDER — INSULIN DETEMIR 100 UNIT/ML ~~LOC~~ SOLN
14.0000 [IU] | Freq: Every day | SUBCUTANEOUS | Status: DC
  Filled 2015-02-21: qty 0.14

## 2015-02-21 MED ORDER — ERYTHROMYCIN BASE 250 MG PO TBEC
250.0000 mg | DELAYED_RELEASE_TABLET | Freq: Four times a day (QID) | ORAL | Status: DC
  Administered 2015-02-21 – 2015-02-22 (×5): 250 mg via ORAL
  Filled 2015-02-21 (×8): qty 1

## 2015-02-21 MED ORDER — DOXYCYCLINE HYCLATE 100 MG PO TABS
100.0000 mg | ORAL_TABLET | Freq: Two times a day (BID) | ORAL | Status: DC
  Administered 2015-02-21 – 2015-02-22 (×3): 100 mg via ORAL
  Filled 2015-02-21 (×4): qty 1

## 2015-02-21 NOTE — Progress Notes (Signed)
Inpatient Diabetes Program Recommendations  AACE/ADA: New Consensus Statement on Inpatient Glycemic Control (2013)  Target Ranges:  Prepandial:   less than 140 mg/dL      Peak postprandial:   less than 180 mg/dL (1-2 hours)      Critically ill patients:  140 - 180 mg/dL   Reason for Visit: Hyper and Hypoglycemia  Diabetes history: DM1 Outpatient Diabetes medications: Levemir 15 units QHS, Novolog 6-15 units tidwc Current orders for Inpatient glycemic control: Lantus 14 units QHS, Novolog sensitive Q4H  Results for LANIKA, COLGATE (MRN [id number]) as of 02/21/2015 10:35  Ref. Range 02/20/2015 17:47 02/20/2015 20:23 02/21/2015 00:11 02/21/2015 04:06 02/21/2015 04:10 02/21/2015 04:37 02/21/2015 05:09 02/21/2015 07:48  Glucose-Capillary Latest Ref Range: 65-99 mg/dL 160 (H) 326 (H) 228 (H) 37 (LL) 37 (LL) 54 (L) 151 (H) 147 (H)   Very labile blood sugars. CHO mod diet begins this am.  Recommendations: Decrease Levemir to 10 units QHS. Titrate if FBS > 180 mg/dL If po intake > 50%, add Novolog 4 units tidwc for meal coverage insulin.  Will continue to follow. Thank you. Lorenda Peck, RD, LDN, CDE Inpatient Diabetes Coordinator [phone]

## 2015-02-21 NOTE — Progress Notes (Signed)
[facility] Progress Note   [name]  URK:[id number]  DOB: September 05, 1965  DOA: 02/19/2015 PCP: Barbette Merino, MD  Brief narrative: [name] is a 49 y.o. female with diabetes mellitus, gastroparesis,diabetic neuropathy, hypertension gastroesophageal reflux disease, constipation who was hospitalized from 8/16 through 8/21 for DKA. She returns to the hospital for intractable nausea and vomiting. Also states that she was placed on doxycycline for 2 week course by [facility] due to an infection in her stomach. When she was last admitted she did not receive the doxycycline when she went home she did try to take it. She is not sure if the doxycycline was causing her nausea and vomiting   Subjective: Vomiting has resolved and she's been eating solid food. Urinating well. Her that her sugars drop. She tells me now that she did not have her evening snack which she usually takes at home. She did not tell us yesterday that she needed this.  Assessment/Plan: Principal Problem:   Intractable nausea and vomiting/diabetic gastroparesis - Continue solid diet, continue to hold ferrous sulfate -Continue when necessary IV Phenergan -Change IV erythromycin to oral erythromycin today --Continue to Hold Lasix -will DC IV fluids today (no heart failure per last echo in 2014) -Continue Protonix 40 twice a day via IV -Continue Marinol which she states helps her nausea --Ativan via IV 3 times a day-transition to oral  Active Problems:   Type 1 diabetes mellitus -Continue Levemir but cut back to 10 units instead of 14-continue low-dose sliding scale insulin  Hypokalemia/ hypomagnesemia  -Replaced    Hypertension -BP low -Hold lisinopril  Note : doxycycline may have been used for H. pylori-she states she took one week of a 2 week course-resumed -I was planning on calling the physician who prescribed it to check to see why it was prescribed but, she cannot recall the name of her  physician at [facility] Status:     Code Status Orders        Start     Ordered   02/20/15 0108  Full code   Continuous     02/20/15 0108     Disposition Plan: Home when stable-likely tomorrow DVT prophylaxis: Lovenox Consultants: Procedures: Appt with PCP: Requested Antibiotics: Anti-infectives    Start     Dose/Rate Route Frequency Ordered Stop   02/21/15 1000  doxycycline (VIBRA-TABS) tablet 100 mg     100 mg Oral Every 12 hours 02/21/15 0803     02/21/15 0900  erythromycin (ERY-TAB) EC tablet 250 mg     250 mg Oral 4 times per day 02/21/15 0800     02/20/15 1000  erythromycin 250 mg in sodium chloride 0.9 % 100 mL IVPB  Status:  Discontinued     250 mg 100 mL/hr over 60 Minutes Intravenous 4 times per day 02/20/15 0825 02/21/15 0801   02/20/15 0915  doxycycline (VIBRAMYCIN) 100 mg in dextrose 5 % 250 mL IVPB  Status:  Discontinued     100 mg 125 mL/hr over 120 Minutes Intravenous Every 12 hours 02/20/15 0824 02/21/15 0803      Objective: Filed Weights   02/20/15 0125  Weight: 64.91 kg (143 lb 1.6 oz)    Intake/Output Summary (Last 24 hours) at 02/21/15 1550 Last data filed at 02/21/15 0443  Gross per 24 hour  Intake    720 ml  Output      0 ml  Net    720 ml     Vitals Filed Vitals:  02/20/15 2144 02/21/15 0212 02/21/15 0511 02/21/15 1423  BP: 126/51 100/50 106/52 91/52  Pulse: 87 72 80 79  Temp: 98.3 F (36.8 C) 97.7 F (36.5 C) 97.8 F (36.6 C) 98.1 F (36.7 C)  TempSrc: Oral Oral Oral Oral  Resp: 20 18 18 17   Height:      Weight:      SpO2: 97% 100% 100% 100%    Exam:  General:  Pt is alert, not in acute distress  HEENT: No icterus, No thrush, oral mucosa moist  Cardiovascular: regular rate and rhythm, S1/S2 No murmur  Respiratory: clear to auscultation bilaterally   Abdomen: Soft, +Bowel sounds, non tender, non distended, no guarding  MSK: No LE edema, cyanosis or clubbing  Data Reviewed: Basic Metabolic Panel:  Recent  Labs Lab 02/19/15 1907 02/19/15 1920 02/20/15 0218 02/20/15 0745 02/21/15 0504  NA 137 136 134* 135 138  K 3.6 3.5 3.6 3.3* 4.2  CL 95* 93* 97* 100* 105  CO2 30  --  27 30 24   GLUCOSE 252* 248* 310* 155* 102*  BUN 16 15 12 10 12   CREATININE 1.13* 0.90 0.90 0.91 0.95  CALCIUM 9.7  --  8.4* 8.5* 8.5*  MG  --   --   --  1.5* 1.9  PHOS  --   --   --  2.5  --    Liver Function Tests:  Recent Labs Lab 02/20/15 0745  AST 18  ALT 15  ALKPHOS 67  BILITOT 0.7  PROT 6.3*  ALBUMIN 3.2*   No results for input(s): LIPASE, AMYLASE in the last 168 hours. No results for input(s): AMMONIA in the last 168 hours. CBC:  Recent Labs Lab 02/19/15 1907 02/19/15 1920 02/20/15 0218 02/20/15 0745  WBC 9.0  --  4.9 4.5  NEUTROABS 7.2  --   --   --   HGB 12.5 13.6 11.3* 11.1*  HCT 37.6 40.0 33.7* 33.1*  MCV 90.0  --  90.3 90.2  PLT 363  --  313 328   Cardiac Enzymes:  Recent Labs Lab 02/20/15 0218 02/20/15 0745 02/20/15 1325  TROPONINI <0.03 <0.03 <0.03   BNP (last 3 results) No results for input(s): BNP in the last 8760 hours.  ProBNP (last 3 results) No results for input(s): PROBNP in the last 8760 hours.  CBG:  Recent Labs Lab 02/21/15 0410 02/21/15 0437 02/21/15 0509 02/21/15 0748 02/21/15 1202  GLUCAP 37* 54* 151* 147* 70    No results found for this or any previous visit (from the past 240 hour(s)).   Studies: Dg Abd Acute W/chest  02/19/2015   CLINICAL DATA:  Nausea and vomiting. Generalized abdominal pain today. Abdominal distention. History of gastric paresis. Diabetes.  EXAM: DG ABDOMEN ACUTE W/ 1V CHEST  COMPARISON:  Abdomen 02/12/2015. Chest 11/15/2014. CT abdomen and pelvis 11/15/2014.  FINDINGS: Normal heart size and pulmonary vascularity. No focal airspace disease or consolidation in the lungs. No blunting of costophrenic angles. No pneumothorax. Mediastinal contours appear intact.  Stool-filled colon. No small or large bowel distention. No free  intra-abdominal air. No abnormal air-fluid levels. No radiopaque stones. Visualized bones appear intact. Surgical clips in the right upper quadrant.  IMPRESSION: No evidence of active pulmonary disease. Normal nonobstructive bowel gas pattern. Stool-filled colon.   Electronically Signed   By: Lucienne Capers M.D.   On: 02/19/2015 20:47    Scheduled Meds:  Scheduled Meds: . docusate sodium  100 mg Oral BID  . doxycycline  100 mg Oral  Q12H  . dronabinol  10 mg Oral BID  . enoxaparin (LOVENOX) injection  40 mg Subcutaneous Q24H  . erythromycin  250 mg Oral 4 times per day  . insulin aspart  0-9 Units Subcutaneous 6 times per day  . insulin detemir  10 Units Subcutaneous QHS  . LORazepam  0.5 mg Oral TID  . pantoprazole (PROTONIX) IV  40 mg Intravenous Q12H  . pregabalin  150 mg Oral BID  . sodium chloride  3 mL Intravenous Q12H   Continuous Infusions:    Time spent on care of this patient: 35 min   [place], MD 02/21/2015, 3:50 PM  LOS: 1 day   [facility] Office  [phone] Pager - Text Page per [email or web address] If 7PM-7AM, please contact night-coverage [email or web address]

## 2015-02-22 DIAGNOSIS — I9589 Other hypotension: Secondary | ICD-10-CM

## 2015-02-22 LAB — GLUCOSE, CAPILLARY
GLUCOSE-CAPILLARY: 37 mg/dL — AB (ref 65–99)
GLUCOSE-CAPILLARY: 306 mg/dL — AB (ref 65–99)
Glucose-Capillary: 160 mg/dL — ABNORMAL HIGH (ref 65–99)
Glucose-Capillary: 73 mg/dL (ref 65–99)
Glucose-Capillary: 214 mg/dL — ABNORMAL HIGH (ref 65–99)

## 2015-02-22 MED ORDER — FLUCONAZOLE 100 MG PO TABS: 100.0000 mg | ORAL_TABLET | ORAL | Status: DC

## 2015-02-22 MED ORDER — ERYTHROMYCIN BASE 250 MG PO TBEC: 250.0000 mg | DELAYED_RELEASE_TABLET | Freq: Four times a day (QID) | ORAL | Status: DC

## 2015-02-22 MED ORDER — PROMETHAZINE HCL 25 MG RE SUPP: 25.0000 mg | Freq: Four times a day (QID) | RECTAL | Status: DC | PRN

## 2015-02-22 MED ORDER — FLUCONAZOLE 100 MG PO TABS
100.0000 mg | ORAL_TABLET | Freq: Once | ORAL | Status: AC
  Administered 2015-02-22: 100 mg via ORAL
  Filled 2015-02-22: qty 1

## 2015-02-22 MED ORDER — FUROSEMIDE 20 MG PO TABS: 40.0000 mg | ORAL_TABLET | Freq: Two times a day (BID) | ORAL | Status: DC | PRN

## 2015-02-22 NOTE — Discharge Summary (Signed)
Physician Discharge Summary  [name] LNL:[id number] DOB: June 11, 1966 DOA: 02/19/2015  PCP: Barbette Merino, MD  Admit date: 02/19/2015 Discharge date: 02/22/2015  Time spent: 55 minutes  Recommendations for Outpatient Follow-up:  1. Has appt with dr Jonelle Sidle for Friday  Discharge Condition: Stable Diet recommendation: Diabetic diet small meals  Discharge Diagnoses:  Principal Problem:   Diabetic gastroparesis Active Problems:   Type 1 diabetes mellitus   Hypertension   Nausea and vomiting   Gastroparesis   Dehydration   History of present illness:  [name] is a 49 y.o. female with diabetes mellitus, gastroparesis,diabetic neuropathy, hypertension gastroesophageal reflux disease, constipation who was hospitalized from 8/16 through 8/21 for DKA. She returns to the hospital for intractable nausea and vomiting. Also states that she was placed on doxycycline for 2 week course by [facility] due to an infection in her stomach. When she was last admitted she did not receive the doxycycline when she went home she did try to take it. She is not sure if the doxycycline was causing her nausea and vomiting  Hospital Course:  Principal Problem:  Intractable nausea and vomiting/diabetic gastroparesis - Tolerating solid diet for greater than 48 hours - -Changed IV erythromycin and other medications to oral yesterday and she continues to do well without any further symptoms of vomiting-eating her meals -Continue the following home medications: Protonix 40 twice a day, Marinol and Ativan which she states help her nausea  Active Problems: Dehydration -Due to above-resolved with IV hydration   Type 1 diabetes mellitus -Continue Levemir and sliding scale insulin  Hypokalemia/ hypomagnesemia  -Replaced   Hypertension -BP low -Hold lisinopril  Vaginal candidiasis? - itching today without discharge - patient insists this is a fungal infection which usually occurs when she  is on antibiotics-states she takes fluconazole-have ordered it-given prescription for one tab every other day while completing course of doxycycline  Note : He states that doxycycline was started for an infection in her abdomen-it is possible that it is being used to treat  H. pylori-she states she took one week of a 2 week course-  she was then admitted to the hospital last week where it was not resumed-when she went home, she did resume it but returned here soon afterwards -I have resumed here and have advised her to complete the course at home   Discharge Exam: Filed Weights   02/20/15 0125  Weight: 64.91 kg (143 lb 1.6 oz)   Filed Vitals:   02/22/15 0404  BP: 97/49  Pulse: 74  Temp: 98.1 F (36.7 C)  Resp: 18    General: AAO x 3, no distress Cardiovascular: RRR, no murmurs  Respiratory: clear to auscultation bilaterally GI: soft, non-tender, non-distended, bowel sound positive  Discharge Instructions You were cared for by a hospitalist during your hospital stay. If you have any questions about your discharge medications or the care you received while you were in the hospital after you are discharged, you can call the unit and asked to speak with the hospitalist on call if the hospitalist that took care of you is not available. Once you are discharged, your primary care physician will handle any further medical issues. Please note that NO REFILLS for any discharge medications will be authorized once you are discharged, as it is imperative that you return to your primary care physician (or establish a relationship with a primary care physician if you do not have one) for your aftercare needs so that they can reassess your need for  medications and monitor your lab values.      Discharge Instructions    Discharge instructions    Complete by:  As directed   Diabetic, low sodium, heart healthy diet Her blood pressure has been low-do not take lisinopril Be careful with taking too much  Lasix as it will make you dehydrated If nausea recurs, do not take ferrous sulfate and see if this helps nausea improved     Increase activity slowly    Complete by:  As directed             Medication List    STOP taking these medications        lisinopril 10 MG tablet  Commonly known as:  PRINIVIL,ZESTRIL      TAKE these medications        acetaminophen 325 MG tablet  Commonly known as:  TYLENOL  Take 650 mg by mouth every 6 (six) hours as needed for moderate pain (cramping).     doxycycline 100 MG capsule  Commonly known as:  VIBRAMYCIN  Take 100 mg by mouth 2 (two) times daily. ABT Start Date 02/06/15 & End Date 02/19/15.     dronabinol 10 MG capsule  Commonly known as:  MARINOL  Take 10 mg by mouth 2 (two) times daily.     erythromycin 250 MG EC tablet  Commonly known as:  ERY-TAB  Take 1 tablet (250 mg total) by mouth every 6 (six) hours.     ferrous sulfate 325 (65 FE) MG tablet  Take 325 mg by mouth daily.     fluconazole 100 MG tablet  Commonly known as:  DIFLUCAN  Take 1 tablet (100 mg total) by mouth every other day.     furosemide 20 MG tablet  Commonly known as:  LASIX  Take 2 tablets (40 mg total) by mouth 2 (two) times daily as needed.     insulin aspart 100 UNIT/ML injection  Commonly known as:  novoLOG  Inject 0-15 Units into the skin 3 (three) times daily with meals.     insulin detemir 100 UNIT/ML injection  Commonly known as:  LEVEMIR  Inject 0.15 mLs (15 Units total) into the skin at bedtime.     LORazepam 0.5 MG tablet  Commonly known as:  ATIVAN  Take 1 tablet by mouth 3 (three) times daily as needed for anxiety.     pantoprazole 40 MG tablet  Commonly known as:  PROTONIX  Take 40 mg by mouth 2 (two) times daily.     pregabalin 150 MG capsule  Commonly known as:  LYRICA  Take 1 capsule (150 mg total) by mouth 2 (two) times daily.     promethazine 25 MG suppository  Commonly known as:  PHENERGAN  Place 1 suppository (25 mg total)  rectally every 6 (six) hours as needed for nausea or vomiting.       Allergies  Allergen Reactions  . Anesthetics, Amide Nausea And Vomiting  . Penicillins Nausea And Vomiting  . Buprenorphine Hcl Rash  . Encainide Nausea And Vomiting  . Metoclopramide Rash  . Morphine And Related Rash   Follow-up Information    Schedule an appointment as soon as possible for a visit with Barbette Merino, MD.   Specialty:  Internal Medicine   Why:  Hospital follow-up   Contact information:   [address] [place] [phone]        The results of significant diagnostics from this hospitalization (including imaging, microbiology, ancillary and laboratory)  are listed below for reference.    Significant Diagnostic Studies: Dg Abd Acute W/chest  02/19/2015   CLINICAL DATA:  Nausea and vomiting. Generalized abdominal pain today. Abdominal distention. History of gastric paresis. Diabetes.  EXAM: DG ABDOMEN ACUTE W/ 1V CHEST  COMPARISON:  Abdomen 02/12/2015. Chest 11/15/2014. CT abdomen and pelvis 11/15/2014.  FINDINGS: Normal heart size and pulmonary vascularity. No focal airspace disease or consolidation in the lungs. No blunting of costophrenic angles. No pneumothorax. Mediastinal contours appear intact.  Stool-filled colon. No small or large bowel distention. No free intra-abdominal air. No abnormal air-fluid levels. No radiopaque stones. Visualized bones appear intact. Surgical clips in the right upper quadrant.  IMPRESSION: No evidence of active pulmonary disease. Normal nonobstructive bowel gas pattern. Stool-filled colon.   Electronically Signed   By: Lucienne Capers M.D.   On: 02/19/2015 20:47   Dg Abd Portable 1v  02/12/2015   CLINICAL DATA:  Abdominal pain for 1 day  EXAM: PORTABLE ABDOMEN - 1 VIEW  COMPARISON:  11/15/2014  FINDINGS: Scattered large and small bowel gas is noted. Changes consistent with prior cholecystectomy are seen. No bony abnormality is seen. No abnormal mass or  abnormal calcifications are noted.  IMPRESSION: No acute abnormality noted.   Electronically Signed   By: Inez Catalina M.D.   On: 02/12/2015 10:18    Microbiology: No results found for this or any previous visit (from the past 240 hour(s)).   Labs: Basic Metabolic Panel:  Recent Labs Lab 02/19/15 1907 02/19/15 1920 02/20/15 0218 02/20/15 0745 02/21/15 0504  NA 137 136 134* 135 138  K 3.6 3.5 3.6 3.3* 4.2  CL 95* 93* 97* 100* 105  CO2 30  --  27 30 24   GLUCOSE 252* 248* 310* 155* 102*  BUN 16 15 12 10 12   CREATININE 1.13* 0.90 0.90 0.91 0.95  CALCIUM 9.7  --  8.4* 8.5* 8.5*  MG  --   --   --  1.5* 1.9  PHOS  --   --   --  2.5  --    Liver Function Tests:  Recent Labs Lab 02/20/15 0745  AST 18  ALT 15  ALKPHOS 67  BILITOT 0.7  PROT 6.3*  ALBUMIN 3.2*   No results for input(s): LIPASE, AMYLASE in the last 168 hours. No results for input(s): AMMONIA in the last 168 hours. CBC:  Recent Labs Lab 02/19/15 1907 02/19/15 1920 02/20/15 0218 02/20/15 0745  WBC 9.0  --  4.9 4.5  NEUTROABS 7.2  --   --   --   HGB 12.5 13.6 11.3* 11.1*  HCT 37.6 40.0 33.7* 33.1*  MCV 90.0  --  90.3 90.2  PLT 363  --  313 328   Cardiac Enzymes:  Recent Labs Lab 02/20/15 0218 02/20/15 0745 02/20/15 1325  TROPONINI <0.03 <0.03 <0.03   BNP: BNP (last 3 results) No results for input(s): BNP in the last 8760 hours.  ProBNP (last 3 results) No results for input(s): PROBNP in the last 8760 hours.  CBG:  Recent Labs Lab 02/21/15 2021 02/22/15 0011 02/22/15 0408 02/22/15 0757 02/22/15 1132  GLUCAP 316* 160* 73 214* 306*       SignedDebbe Odea, MD [facility] 02/22/2015, 1:28 PM

## 2015-03-08 ENCOUNTER — Encounter (HOSPITAL_COMMUNITY): Payer: Self-pay | Admitting: *Deleted

## 2015-03-08 ENCOUNTER — Inpatient Hospital Stay (HOSPITAL_COMMUNITY)
Admission: EM | Admit: 2015-03-08 | Discharge: 2015-03-12 | DRG: 074 | Disposition: A | Discharge status: Home Health | Payer: 59 | Attending: Internal Medicine | Admitting: Internal Medicine

## 2015-03-08 DIAGNOSIS — Z833 Family history of diabetes mellitus: Secondary | ICD-10-CM

## 2015-03-08 DIAGNOSIS — D649 Anemia, unspecified: Secondary | ICD-10-CM | POA: Diagnosis present

## 2015-03-08 DIAGNOSIS — Z888 Allergy status to other drugs, medicaments and biological substances status: Secondary | ICD-10-CM

## 2015-03-08 DIAGNOSIS — K3184 Gastroparesis: Secondary | ICD-10-CM

## 2015-03-08 DIAGNOSIS — Z79899 Other long term (current) drug therapy: Secondary | ICD-10-CM

## 2015-03-08 DIAGNOSIS — R112 Nausea with vomiting, unspecified: Secondary | ICD-10-CM | POA: Diagnosis not present

## 2015-03-08 DIAGNOSIS — N179 Acute kidney failure, unspecified: Secondary | ICD-10-CM | POA: Diagnosis present

## 2015-03-08 DIAGNOSIS — F419 Anxiety disorder, unspecified: Secondary | ICD-10-CM | POA: Diagnosis present

## 2015-03-08 DIAGNOSIS — Z8249 Family history of ischemic heart disease and other diseases of the circulatory system: Secondary | ICD-10-CM

## 2015-03-08 DIAGNOSIS — E872 Acidosis: Secondary | ICD-10-CM | POA: Diagnosis present

## 2015-03-08 DIAGNOSIS — R300 Dysuria: Secondary | ICD-10-CM | POA: Diagnosis present

## 2015-03-08 DIAGNOSIS — J45909 Unspecified asthma, uncomplicated: Secondary | ICD-10-CM | POA: Diagnosis present

## 2015-03-08 DIAGNOSIS — E1043 Type 1 diabetes mellitus with diabetic autonomic (poly)neuropathy: Principal | ICD-10-CM | POA: Diagnosis present

## 2015-03-08 DIAGNOSIS — K529 Noninfective gastroenteritis and colitis, unspecified: Secondary | ICD-10-CM | POA: Diagnosis present

## 2015-03-08 DIAGNOSIS — Z9049 Acquired absence of other specified parts of digestive tract: Secondary | ICD-10-CM | POA: Diagnosis present

## 2015-03-08 DIAGNOSIS — D72829 Elevated white blood cell count, unspecified: Secondary | ICD-10-CM | POA: Diagnosis present

## 2015-03-08 DIAGNOSIS — Z885 Allergy status to narcotic agent status: Secondary | ICD-10-CM

## 2015-03-08 DIAGNOSIS — E1065 Type 1 diabetes mellitus with hyperglycemia: Secondary | ICD-10-CM | POA: Diagnosis present

## 2015-03-08 DIAGNOSIS — K5900 Constipation, unspecified: Secondary | ICD-10-CM | POA: Diagnosis present

## 2015-03-08 DIAGNOSIS — Z886 Allergy status to analgesic agent status: Secondary | ICD-10-CM

## 2015-03-08 DIAGNOSIS — T82598A Other mechanical complication of other cardiac and vascular devices and implants, initial encounter: Secondary | ICD-10-CM

## 2015-03-08 DIAGNOSIS — K219 Gastro-esophageal reflux disease without esophagitis: Secondary | ICD-10-CM | POA: Diagnosis present

## 2015-03-08 DIAGNOSIS — Z794 Long term (current) use of insulin: Secondary | ICD-10-CM

## 2015-03-08 DIAGNOSIS — R109 Unspecified abdominal pain: Secondary | ICD-10-CM

## 2015-03-08 DIAGNOSIS — I10 Essential (primary) hypertension: Secondary | ICD-10-CM | POA: Diagnosis present

## 2015-03-08 DIAGNOSIS — Z880 Allergy status to penicillin: Secondary | ICD-10-CM

## 2015-03-08 DIAGNOSIS — R739 Hyperglycemia, unspecified: Secondary | ICD-10-CM

## 2015-03-08 DIAGNOSIS — R509 Fever, unspecified: Secondary | ICD-10-CM | POA: Diagnosis present

## 2015-03-08 NOTE — ED Notes (Signed)
Per EMS pt is c/o vomiting for the past 2hrs  Pt has hx of gastroporesis  Pt refused an IV but was given Zofran 4 mg IM in her left upper arm by EMS prior to arrival  CBG of 218

## 2015-03-09 DIAGNOSIS — Z885 Allergy status to narcotic agent status: Secondary | ICD-10-CM | POA: Diagnosis not present

## 2015-03-09 DIAGNOSIS — E1065 Type 1 diabetes mellitus with hyperglycemia: Secondary | ICD-10-CM | POA: Diagnosis present

## 2015-03-09 DIAGNOSIS — E1043 Type 1 diabetes mellitus with diabetic autonomic (poly)neuropathy: Secondary | ICD-10-CM | POA: Diagnosis present

## 2015-03-09 DIAGNOSIS — Z886 Allergy status to analgesic agent status: Secondary | ICD-10-CM | POA: Diagnosis not present

## 2015-03-09 DIAGNOSIS — D649 Anemia, unspecified: Secondary | ICD-10-CM | POA: Diagnosis present

## 2015-03-09 DIAGNOSIS — F419 Anxiety disorder, unspecified: Secondary | ICD-10-CM | POA: Diagnosis present

## 2015-03-09 DIAGNOSIS — K3184 Gastroparesis: Secondary | ICD-10-CM | POA: Diagnosis present

## 2015-03-09 DIAGNOSIS — E0841 Diabetes mellitus due to underlying condition with diabetic mononeuropathy: Secondary | ICD-10-CM | POA: Diagnosis not present

## 2015-03-09 DIAGNOSIS — K5900 Constipation, unspecified: Secondary | ICD-10-CM | POA: Diagnosis present

## 2015-03-09 DIAGNOSIS — Z8249 Family history of ischemic heart disease and other diseases of the circulatory system: Secondary | ICD-10-CM | POA: Diagnosis not present

## 2015-03-09 DIAGNOSIS — K529 Noninfective gastroenteritis and colitis, unspecified: Secondary | ICD-10-CM | POA: Diagnosis present

## 2015-03-09 DIAGNOSIS — Z880 Allergy status to penicillin: Secondary | ICD-10-CM | POA: Diagnosis not present

## 2015-03-09 DIAGNOSIS — K219 Gastro-esophageal reflux disease without esophagitis: Secondary | ICD-10-CM | POA: Diagnosis present

## 2015-03-09 DIAGNOSIS — R112 Nausea with vomiting, unspecified: Secondary | ICD-10-CM | POA: Diagnosis present

## 2015-03-09 DIAGNOSIS — N179 Acute kidney failure, unspecified: Secondary | ICD-10-CM | POA: Diagnosis present

## 2015-03-09 DIAGNOSIS — Z9049 Acquired absence of other specified parts of digestive tract: Secondary | ICD-10-CM | POA: Diagnosis present

## 2015-03-09 DIAGNOSIS — Z888 Allergy status to other drugs, medicaments and biological substances status: Secondary | ICD-10-CM | POA: Diagnosis not present

## 2015-03-09 DIAGNOSIS — E872 Acidosis: Secondary | ICD-10-CM | POA: Diagnosis present

## 2015-03-09 DIAGNOSIS — J45909 Unspecified asthma, uncomplicated: Secondary | ICD-10-CM | POA: Diagnosis present

## 2015-03-09 DIAGNOSIS — D72829 Elevated white blood cell count, unspecified: Secondary | ICD-10-CM | POA: Diagnosis present

## 2015-03-09 DIAGNOSIS — Z833 Family history of diabetes mellitus: Secondary | ICD-10-CM | POA: Diagnosis not present

## 2015-03-09 DIAGNOSIS — R300 Dysuria: Secondary | ICD-10-CM | POA: Diagnosis present

## 2015-03-09 DIAGNOSIS — Z794 Long term (current) use of insulin: Secondary | ICD-10-CM | POA: Diagnosis not present

## 2015-03-09 DIAGNOSIS — R509 Fever, unspecified: Secondary | ICD-10-CM | POA: Diagnosis present

## 2015-03-09 DIAGNOSIS — G43A1 Cyclical vomiting, intractable: Secondary | ICD-10-CM | POA: Diagnosis not present

## 2015-03-09 DIAGNOSIS — Z79899 Other long term (current) drug therapy: Secondary | ICD-10-CM | POA: Diagnosis not present

## 2015-03-09 DIAGNOSIS — I10 Essential (primary) hypertension: Secondary | ICD-10-CM | POA: Diagnosis present

## 2015-03-09 LAB — CBG MONITORING, ED: Glucose-Capillary: 341 mg/dL — ABNORMAL HIGH (ref 65–99)

## 2015-03-09 LAB — BASIC METABOLIC PANEL
Anion gap: 15 (ref 5–15)
BUN: 18 mg/dL (ref 6–20)
CHLORIDE: 102 mmol/L (ref 101–111)
CO2: 20 mmol/L — AB (ref 22–32)
Calcium: 9.5 mg/dL (ref 8.9–10.3)
Creatinine, Ser: 1.07 mg/dL — ABNORMAL HIGH (ref 0.44–1.00)
GFR calc Af Amer: >60 mL/min (ref >60)
GFR calc non Af Amer: >60 mL/min (ref >60)
GLUCOSE: 403 mg/dL — AB (ref 65–99)
POTASSIUM: 4.4 mmol/L (ref 3.5–5.1)
SODIUM: 137 mmol/L (ref 135–145)

## 2015-03-09 LAB — CBC WITH DIFFERENTIAL/PLATELET
Basophils Absolute: 0 10*3/uL (ref 0.0–0.1)
Basophils Relative: 0 %
Eosinophils Absolute: 0 10*3/uL (ref 0.0–0.7)
Eosinophils Relative: 1 %
HEMATOCRIT: 38.7 % (ref 36.0–46.0)
HEMOGLOBIN: 12.6 g/dL (ref 12.0–15.0)
LYMPHS ABS: 1.2 10*3/uL (ref 0.7–4.0)
LYMPHS PCT: 14 %
MCH: 29.6 pg (ref 26.0–34.0)
MCHC: 32.6 g/dL (ref 30.0–36.0)
MCV: 90.8 fL (ref 78.0–100.0)
Monocytes Absolute: 0.3 10*3/uL (ref 0.1–1.0)
Monocytes Relative: 4 %
NEUTROS ABS: 6.8 10*3/uL (ref 1.7–7.7)
NEUTROS PCT: 81 %
Platelets: 365 10*3/uL (ref 150–400)
RBC: 4.26 MIL/uL (ref 3.87–5.11)
RDW: 13.9 % (ref 11.5–15.5)
WBC: 8.3 10*3/uL (ref 4.0–10.5)

## 2015-03-09 LAB — URINALYSIS, ROUTINE W REFLEX MICROSCOPIC
Bilirubin Urine: NEGATIVE
Glucose, UA: >1000 mg/dL — AB
LEUKOCYTES UA: NEGATIVE
NITRITE: NEGATIVE
PH: 6 (ref 5.0–8.0)
Protein, ur: NEGATIVE mg/dL
Specific Gravity, Urine: 1.019 (ref 1.005–1.030)
Urobilinogen, UA: 0.2 mg/dL (ref 0.0–1.0)

## 2015-03-09 LAB — GLUCOSE, CAPILLARY
GLUCOSE-CAPILLARY: 287 mg/dL — AB (ref 65–99)
Glucose-Capillary: 387 mg/dL — ABNORMAL HIGH (ref 65–99)
Glucose-Capillary: 320 mg/dL — ABNORMAL HIGH (ref 65–99)
Glucose-Capillary: 214 mg/dL — ABNORMAL HIGH (ref 65–99)

## 2015-03-09 LAB — URINE MICROSCOPIC-ADD ON

## 2015-03-09 LAB — MAGNESIUM: MAGNESIUM: 1.9 mg/dL (ref 1.7–2.4)

## 2015-03-09 MED ORDER — PREGABALIN 75 MG PO CAPS
150.0000 mg | ORAL_CAPSULE | Freq: Two times a day (BID) | ORAL | Status: DC
  Administered 2015-03-09 – 2015-03-12 (×6): 150 mg via ORAL
  Filled 2015-03-09 (×6): qty 2

## 2015-03-09 MED ORDER — ZOLPIDEM TARTRATE 5 MG PO TABS
5.0000 mg | ORAL_TABLET | Freq: Once | ORAL | Status: AC
  Administered 2015-03-09: 5 mg via ORAL
  Filled 2015-03-09: qty 1

## 2015-03-09 MED ORDER — SODIUM CHLORIDE 0.9 % IJ SOLN
3.0000 mL | Freq: Two times a day (BID) | INTRAMUSCULAR | Status: DC
  Administered 2015-03-09 – 2015-03-12 (×5): 3 mL via INTRAVENOUS

## 2015-03-09 MED ORDER — INSULIN ASPART 100 UNIT/ML ~~LOC~~ SOLN
0.0000 [IU] | Freq: Three times a day (TID) | SUBCUTANEOUS | Status: DC
  Administered 2015-03-09: 9 [IU] via SUBCUTANEOUS
  Administered 2015-03-09: 5 [IU] via SUBCUTANEOUS
  Administered 2015-03-09: 7 [IU] via SUBCUTANEOUS
  Administered 2015-03-10 (×2): 2 [IU] via SUBCUTANEOUS
  Administered 2015-03-10: 3 [IU] via SUBCUTANEOUS
  Administered 2015-03-11: 2 [IU] via SUBCUTANEOUS
  Administered 2015-03-11: 1 [IU] via SUBCUTANEOUS
  Administered 2015-03-11: 2 [IU] via SUBCUTANEOUS
  Administered 2015-03-12: 1 [IU] via SUBCUTANEOUS

## 2015-03-09 MED ORDER — ONDANSETRON HCL 4 MG/2ML IJ SOLN
4.0000 mg | Freq: Once | INTRAMUSCULAR | Status: AC
  Administered 2015-03-09: 4 mg via INTRAVENOUS
  Filled 2015-03-09: qty 2

## 2015-03-09 MED ORDER — DRONABINOL 2.5 MG PO CAPS
10.0000 mg | ORAL_CAPSULE | Freq: Two times a day (BID) | ORAL | Status: DC
  Administered 2015-03-09 – 2015-03-12 (×7): 10 mg via ORAL
  Filled 2015-03-09 (×7): qty 4

## 2015-03-09 MED ORDER — LORAZEPAM 2 MG/ML IJ SOLN
0.5000 mg | Freq: Four times a day (QID) | INTRAMUSCULAR | Status: DC | PRN
  Administered 2015-03-09 – 2015-03-10 (×4): 1 mg via INTRAVENOUS
  Filled 2015-03-09 (×4): qty 1

## 2015-03-09 MED ORDER — INSULIN ASPART 100 UNIT/ML ~~LOC~~ SOLN
8.0000 [IU] | Freq: Once | SUBCUTANEOUS | Status: AC
  Administered 2015-03-09: 8 [IU] via INTRAVENOUS
  Filled 2015-03-09: qty 1

## 2015-03-09 MED ORDER — PROMETHAZINE HCL 25 MG/ML IJ SOLN
25.0000 mg | INTRAMUSCULAR | Status: DC | PRN
  Administered 2015-03-09 (×2): 25 mg via INTRAVENOUS
  Filled 2015-03-09 (×2): qty 1

## 2015-03-09 MED ORDER — FERROUS SULFATE 325 (65 FE) MG PO TABS
325.0000 mg | ORAL_TABLET | Freq: Every day | ORAL | Status: DC
  Administered 2015-03-10 – 2015-03-12 (×3): 325 mg via ORAL
  Filled 2015-03-09 (×3): qty 1

## 2015-03-09 MED ORDER — SODIUM CHLORIDE 0.9 % IV BOLUS (SEPSIS)
1000.0000 mL | Freq: Once | INTRAVENOUS | Status: AC
  Administered 2015-03-09: 1000 mL via INTRAVENOUS

## 2015-03-09 MED ORDER — INSULIN ASPART 100 UNIT/ML ~~LOC~~ SOLN: 0.0000 [IU] | SUBCUTANEOUS | Status: DC

## 2015-03-09 MED ORDER — SODIUM CHLORIDE 0.9 % IV SOLN
INTRAVENOUS | Status: DC
  Administered 2015-03-09: 20:00:00 via INTRAVENOUS

## 2015-03-09 MED ORDER — INSULIN DETEMIR 100 UNIT/ML ~~LOC~~ SOLN
15.0000 [IU] | Freq: Every day | SUBCUTANEOUS | Status: DC
  Administered 2015-03-09 – 2015-03-10 (×2): 15 [IU] via SUBCUTANEOUS
  Filled 2015-03-09 (×2): qty 0.15

## 2015-03-09 MED ORDER — SODIUM CHLORIDE 0.9 % IV SOLN
INTRAVENOUS | Status: AC
  Administered 2015-03-09: 06:00:00 via INTRAVENOUS

## 2015-03-09 MED ORDER — ENOXAPARIN SODIUM 40 MG/0.4ML ~~LOC~~ SOLN
40.0000 mg | SUBCUTANEOUS | Status: DC
  Administered 2015-03-09 – 2015-03-11 (×3): 40 mg via SUBCUTANEOUS
  Filled 2015-03-09 (×4): qty 0.4

## 2015-03-09 MED ORDER — ONDANSETRON HCL 4 MG PO TABS
4.0000 mg | ORAL_TABLET | Freq: Four times a day (QID) | ORAL | Status: DC | PRN
  Administered 2015-03-11: 4 mg via ORAL
  Filled 2015-03-09: qty 1

## 2015-03-09 MED ORDER — LORAZEPAM 2 MG/ML IJ SOLN
1.0000 mg | Freq: Once | INTRAMUSCULAR | Status: AC
  Administered 2015-03-09: 1 mg via INTRAVENOUS
  Filled 2015-03-09: qty 1

## 2015-03-09 MED ORDER — PANTOPRAZOLE SODIUM 40 MG PO TBEC
40.0000 mg | DELAYED_RELEASE_TABLET | Freq: Two times a day (BID) | ORAL | Status: DC
  Administered 2015-03-09 – 2015-03-12 (×7): 40 mg via ORAL
  Filled 2015-03-09 (×10): qty 1

## 2015-03-09 MED ORDER — ACETAMINOPHEN 325 MG PO TABS: 650.0000 mg | ORAL_TABLET | Freq: Four times a day (QID) | ORAL | Status: DC | PRN

## 2015-03-09 MED ORDER — PROMETHAZINE HCL 25 MG/ML IJ SOLN
25.0000 mg | Freq: Once | INTRAMUSCULAR | Status: AC
  Administered 2015-03-09: 25 mg via INTRAVENOUS
  Filled 2015-03-09: qty 1

## 2015-03-09 MED ORDER — SODIUM CHLORIDE 0.9 % IV SOLN: INTRAVENOUS | Status: DC

## 2015-03-09 MED ORDER — ONDANSETRON HCL 4 MG/2ML IJ SOLN: 4.0000 mg | Freq: Three times a day (TID) | INTRAMUSCULAR | Status: DC | PRN

## 2015-03-09 MED ORDER — ONDANSETRON HCL 4 MG/2ML IJ SOLN: 4.0000 mg | INTRAMUSCULAR | Status: DC | PRN

## 2015-03-09 MED ORDER — ONDANSETRON HCL 4 MG/2ML IJ SOLN
4.0000 mg | Freq: Four times a day (QID) | INTRAMUSCULAR | Status: DC | PRN
  Administered 2015-03-09 – 2015-03-10 (×4): 4 mg via INTRAVENOUS
  Filled 2015-03-09 (×4): qty 2

## 2015-03-09 NOTE — Progress Notes (Signed)
Assessed bilateral upper arms for PICC line placement. Catheter would occupy 41% of basilic vein in left arm and 70% of brachial vein in right arm. Standards of care does not allow [place] to place PICC in vein where catheter would occupy greater than 45%. RN aware.

## 2015-03-09 NOTE — Progress Notes (Signed)
This RN spoke with attending MD, [name]. MD gave verbal orders to RN and stated she will not be able to see pt for admission orders, but gave verbal orders for necessites at this time. Will continue to monitor pt closely. Carnella [place] I

## 2015-03-09 NOTE — ED Notes (Signed)
[place] RN at bedside attempting ultrasound IV

## 2015-03-09 NOTE — ED Notes (Signed)
Pt has diarrhea

## 2015-03-09 NOTE — ED Notes (Signed)
Nausea medication effective this last try,  Pt is resting more comfortably now

## 2015-03-09 NOTE — Progress Notes (Signed)
IV rt wrist w/ good blood return but positional.ns at 100 ml/hr.

## 2015-03-09 NOTE — Progress Notes (Signed)
MD made aware of unable to insert PICC per IV team.

## 2015-03-09 NOTE — Care Management Note (Signed)
Case Management Note  Patient Details  Name: Keyona Emrich MRN: [id number] Date of Birth: 1965-08-08  Subjective/Objective: 49 y/o f admitted w/Gastroparesis. From home.                   Action/Plan:d/c plan home.   Expected Discharge Date:                 Expected Discharge Plan:  Home/Self Care  In-House Referral:     Discharge planning Services  CM Consult  Post Acute Care Choice:    Choice offered to:     DME Arranged:    DME Agency:     HH Arranged:    HH Agency:     Status of Service:  In process, will continue to follow  Medicare Important Message Given:    Date Medicare IM Given:    Medicare IM give by:    Date Additional Medicare IM Given:    Additional Medicare Important Message give by:     If discussed at [place] of Stay Meetings, dates discussed:    Additional Comments:  Dessa Phi, RN 03/09/2015, 12:01 PM

## 2015-03-09 NOTE — ED Provider Notes (Signed)
CSN: [id number]     Arrival date & time 03/08/15  2343 History  This chart was scribed for Varney Biles, MD by Randa Evens, ED Scribe. This patient was seen in room WA23/WA23 and the patient's care was started at 12:21 AM.      Chief Complaint  Patient presents with  . Emesis   The history is provided by the patient. No language interpreter was used.   HPI Comments: [name] is a 49 y.o. female brought in by ambulance, who presents to the Emergency Department complaining of vomiting x4 onset thsi evening PTA. Pt describes what she is vomiting as white material. Pt state she has associated nausea and abdominal pain. Pt also reports constipated, she states that her last BM was 2 days prior. Pt states that she is still producing flatus. Pt states that she has tried Marinol with no relief. She states that she is not bale to get relief due to vomiting the medications up. She denies any pain unless she is vomiting. Pt states that her symptoms are similar to previous gastroparesis flare. She state that she was recently admitted 3 weeks ago for a gastroparesis flare.   Past Medical History  Diagnosis Date  . Hypertension   . Cataract   . Anxiety   . Anemia   . GERD (gastroesophageal reflux disease)   . Constipation 04/03/2012  . Complication of anesthesia   . PONV (postoperative nausea and vomiting)   . Asthma     IN CHILDHOOD  . Neuromuscular disorder     Diabetic neuropathy  . Gastroparesis   . Diabetic neuropathy   . History of blood product transfusion   . Diabetes mellitus     diagnosed age 34   Past Surgical History  Procedure Laterality Date  . Retinal detachment surgery    . Posterior vitrectomy and membrane peel-left eye  09/28/2002  . Posterior vitrectomy and membrane peel-right eye  03/16/2002  . Eye surgery    . Gailstones     Family History  Problem Relation Age of Onset  . Cystic fibrosis Mother   . Hypertension Father   . Diabetes Brother   . Hypertension  Maternal Grandmother    Social History  Substance Use Topics  . Smoking status: Never Smoker   . Smokeless tobacco: Never Used  . Alcohol Use: No     Comment: none per patient August 2016   OB History    No data available     Review of Systems  Gastrointestinal: Positive for nausea, vomiting and abdominal pain.  All other systems reviewed and are negative.    Allergies  Anesthetics, amide; Penicillins; Buprenorphine hcl; Encainide; Metoclopramide; and Morphine and related  Home Medications   Prior to Admission medications   Medication Sig Start Date End Date Taking? Authorizing Provider  acetaminophen (TYLENOL) 325 MG tablet Take 650 mg by mouth every 6 (six) hours as needed for moderate pain (cramping).    Yes Historical Provider, MD  dronabinol (MARINOL) 10 MG capsule Take 10 mg by mouth 2 (two) times daily. 02/04/15  Yes Historical Provider, MD  ferrous sulfate 325 (65 FE) MG tablet Take 325 mg by mouth daily.    Yes Historical Provider, MD  furosemide (LASIX) 20 MG tablet Take 2 tablets (40 mg total) by mouth 2 (two) times daily as needed. Patient taking differently: Take 40 mg by mouth 2 (two) times daily as needed for fluid.  02/22/15  Yes Debbe Odea, MD  insulin aspart (NOVOLOG) 100  UNIT/ML injection Inject 0-15 Units into the skin 3 (three) times daily with meals. Patient taking differently: Inject 0-15 Units into the skin 3 (three) times daily with meals. 6-15 units three times daily with food.  Per sliding scale 11/28/12  Yes Nishant Dhungel, MD  insulin detemir (LEVEMIR) 100 UNIT/ML injection Inject 0.15 mLs (15 Units total) into the skin at bedtime. 02/17/13  Yes Robbie Lis, MD  LORazepam (ATIVAN) 0.5 MG tablet Take 1 tablet by mouth 3 (three) times daily as needed for anxiety.  11/29/14  Yes Historical Provider, MD  pantoprazole (PROTONIX) 40 MG tablet Take 40 mg by mouth 2 (two) times daily. 01/04/15  Yes Historical Provider, MD  pregabalin (LYRICA) 150 MG capsule Take  1 capsule (150 mg total) by mouth 2 (two) times daily. 02/17/13  Yes Robbie Lis, MD   BP 120/58 mmHg  Pulse 107  Temp(Src) 98.4 F (36.9 C) (Oral)  Resp 18  Ht 5\' 2"  (1.575 m)  Wt 137 lb 12.6 oz (62.5 kg)  BMI 25.20 kg/m2  SpO2 100%  LMP 02/08/2015   Physical Exam  Constitutional: She appears well-developed and well-nourished.  HENT:  Head: Normocephalic and atraumatic.  Mucous membranes appear slightly dry.   Eyes: Conjunctivae are normal. Right eye exhibits no discharge. Left eye exhibits no discharge.  Cardiovascular: Regular rhythm.  Tachycardia present.   No murmur heard. Pulmonary/Chest: Effort normal. No respiratory distress.  Lungs clear to anterior auscultation.   Abdominal: Soft. There is no rebound and no guarding.  Abdomen soft with no focal rebound or guarding.   Neurological: She is alert. Coordination normal.  Skin: Skin is warm and dry. No rash noted. She is not diaphoretic. No erythema.  Cap refill less than 3 seconds.   Psychiatric: She has a normal mood and affect.  Nursing note and vitals reviewed.   ED Course  Procedures (including critical care time) DIAGNOSTIC STUDIES: Oxygen Saturation is 100% on RA, normal by my interpretation.    COORDINATION OF CARE: 12:59 AM-Discussed treatment plan with pt at bedside and pt agreed to plan.    Labs Review Labs Reviewed  BASIC METABOLIC PANEL - Abnormal; Notable for the following:    CO2 20 (*)    Glucose, Bld 403 (*)    Creatinine, Ser 1.07 (*)    All other components within normal limits  URINALYSIS, ROUTINE W REFLEX MICROSCOPIC (NOT AT [facility]) - Abnormal; Notable for the following:    Glucose, UA >1000 (*)    Hgb urine dipstick TRACE (*)    Ketones, ur >80 (*)    All other components within normal limits  GLUCOSE, CAPILLARY - Abnormal; Notable for the following:    Glucose-Capillary 387 (*)    All other components within normal limits  GLUCOSE, CAPILLARY - Abnormal; Notable for the following:     Glucose-Capillary 320 (*)    All other components within normal limits  CBC - Abnormal; Notable for the following:    WBC 16.1 (*)    All other components within normal limits  GLUCOSE, CAPILLARY - Abnormal; Notable for the following:    Glucose-Capillary 214 (*)    All other components within normal limits  GLUCOSE, CAPILLARY - Abnormal; Notable for the following:    Glucose-Capillary 287 (*)    All other components within normal limits  COMPREHENSIVE METABOLIC PANEL - Abnormal; Notable for the following:    Chloride 114 (*)    CO2 19 (*)    Glucose, Bld 258 (*)  Creatinine, Ser 1.20 (*)    Calcium 8.6 (*)    Albumin 3.3 (*)    ALT 13 (*)    Total Bilirubin 1.4 (*)    GFR calc non Af Amer 53 (*)    All other components within normal limits  GLUCOSE, CAPILLARY - Abnormal; Notable for the following:    Glucose-Capillary 271 (*)    All other components within normal limits  GLUCOSE, CAPILLARY - Abnormal; Notable for the following:    Glucose-Capillary 233 (*)    All other components within normal limits  CBG MONITORING, ED - Abnormal; Notable for the following:    Glucose-Capillary 341 (*)    All other components within normal limits  URINE CULTURE  CULTURE, BLOOD (ROUTINE X 2)  CULTURE, BLOOD (ROUTINE X 2)  CBC WITH DIFFERENTIAL/PLATELET  MAGNESIUM  URINE MICROSCOPIC-ADD ON  LACTIC ACID, PLASMA  LACTIC ACID, PLASMA    Imaging Review No results found.     EKG Interpretation None      MDM   Final diagnoses:  Gastroparesis  Hyperglycemia without ketosis     Pt comes in with nausea, emesis. She is in active flare up of gastroparesis, and is in early stages of decompensation. She has elevated glucose - but not in DKA. She will be admitted after failed po attempts here and inability to get symptoms in better control.   Varney Biles, MD 03/10/15 [phone]

## 2015-03-09 NOTE — Progress Notes (Signed)
Inpatient Diabetes Program Recommendations  AACE/ADA: New Consensus Statement on Inpatient Glycemic Control (2015)  Target Ranges:  Prepandial:   less than 140 mg/dL      Peak postprandial:   less than 180 mg/dL (1-2 hours)      Critically ill patients:  140 - 180 mg/dL   Review of Glycemic Control  Diabetes history: DM1 Outpatient Diabetes medications: Levemir 15 units QHS, Novolog 6-15 units tidwc Current orders for Inpatient glycemic control: Levemir 15 units QHS, Novolog sensitive tidwc  Results for ANNELYSE, REY (MRN [id number]) as of 03/09/2015 10:24  Ref. Range 03/09/2015 05:11 03/09/2015 07:43  Glucose-Capillary Latest Ref Range: 65-99 mg/dL 341 (H) 387 (H)  Results for SHARONA, ROVNER (MRN [id number]) as of 03/09/2015 10:24  Ref. Range 02/20/2015 02:18  Hemoglobin A1C Latest Ref Range: 4.8-5.6 % 8.9 (H)   Inpatient Diabetes Program Recommendations:     Add HS correction Add Novolog 3 units tidwc for meal coverage insulin if pt eats > 50% meal. Add CHO mod med to heart healthy diet   Will continue to follow. Thank you. Lorenda Peck, RD, LDN, CDE Inpatient Diabetes Coordinator [phone]

## 2015-03-09 NOTE — ED Notes (Signed)
Updated Dr Kathrynn Humble that pt continues to be very nausea  And has diarrhea,  He is aware also of urine not obtained due to mixing with diarrhea

## 2015-03-09 NOTE — H&P (Signed)
[facility] History and Physical  [name] LPF:[id number] DOB: [birth year] DOA: 03/08/2015  Referring physician: ED physician PCP: Barbette Merino, MD   Chief Complaint: N/V  HPI:  49 y.o. female with diabetes mellitus, gastroparesis, diabetic neuropathy, hypertension, gastroesophageal reflux disease, constipation who was hospitalized from 8/16- 8/21 for DKA, again from 8/27 - 8/30 for NV, now presented back to [facility] ED with main concern of intractable nausea and vomiting several days in duration associated with abd cramps. Pt describes cramps as throbbing and intermittent, 7/10 in severity,worse with vomiting episodes, no specific alleviating or aggravating factors, non radiating. Pt describes this episode as typical for her gastroparesis flare. Pt denies recent sick contacts, no fevers, chills, no chest pain or shortness of breath.   In Ed, pt noted to be hemodynamically stable with VS notable for HR up to 120's, otherwise stable, blood work notable for Cr 1.07 and Glucose 403. TR[facility] asked to admit for further evaluation.   Hospital Course:  Principal Problem:  Intractable nausea and vomiting - Secondary to gastroparesis in the setting of uncontrolled diabetes - Allow for liquid diet if patient able to tolerate - Placed on IV fluids and antiemetics - Patient intolerant to Reglan - Reassess in 24 hours Active Problems:   Type 1 diabetes, uncontrolled, with gastroparesis and neuropathies - Continue home regimen with Levemir - Advised sliding scale insulin for better control - Continue Lyrica   Gastroparesis due to DM - Continue Marinol per home medical regimen - Add antiemetics as needed   Acute kidney injury - Prerenal, continue IV fluids - Repeat BMP in the  DVT prophylaxis - Lovenox SQ  Radiological Exams on Admission: No results found.   Code Status: Full Family Communication: Pt at bedside Disposition Plan: Admit for further evaluation    [facility] [phone]   Review of Systems:  Constitutional: Negative for diaphoresis.  HENT: Negative for hearing loss, ear pain, nosebleeds, congestion, sore throat, neck pain, tinnitus and ear discharge.   Eyes: Negative for blurred vision, double vision, photophobia, pain, discharge and redness.  Respiratory: Negative for cough, hemoptysis, sputum production, shortness of breath, wheezing and stridor.   Cardiovascular: Negative for chest pain, palpitations, orthopnea, claudication and leg swelling.  Gastrointestinal: Per history of present illness Genitourinary: Negative for dysuria, urgency, frequency, hematuria and flank pain.  Musculoskeletal: Negative for myalgias, back pain, joint pain and falls.  Skin: Negative for itching and rash.  Neurological: Negative for dizziness and weakness. Negative for tingling, tremors, sensory change, speech change, focal weakness, loss of consciousness and headaches.  Endo/Heme/Allergies: Negative for environmental allergies and polydipsia. Does not bruise/bleed easily.  Psychiatric/Behavioral: Negative for suicidal ideas. The patient is not nervous/anxious.      Past Medical History  Diagnosis Date  . Hypertension   . Cataract   . Anxiety   . Anemia   . GERD (gastroesophageal reflux disease)   . Constipation 04/03/2012  . Complication of anesthesia   . PONV (postoperative nausea and vomiting)   . Asthma     IN CHILDHOOD  . Neuromuscular disorder     Diabetic neuropathy  . Gastroparesis   . Diabetic neuropathy   . History of blood product transfusion   . Diabetes mellitus     diagnosed age 40    Past Surgical History  Procedure Laterality Date  . Retinal detachment surgery    . Posterior vitrectomy and membrane peel-left eye  09/28/2002  . Posterior vitrectomy and membrane peel-right eye  03/16/2002  . Eye surgery    .  Gailstones      Social History:  reports that she has never smoked. She has never used smokeless tobacco. She reports  that she does not drink alcohol or use illicit drugs.  Allergies  Allergen Reactions  . Anesthetics, Amide Nausea And Vomiting  . Penicillins Nausea And Vomiting  . Buprenorphine Hcl Rash  . Encainide Nausea And Vomiting  . Metoclopramide Rash  . Morphine And Related Rash    Family History  Problem Relation Age of Onset  . Cystic fibrosis Mother   . Hypertension Father   . Diabetes Brother   . Hypertension Maternal Grandmother     Prior to Admission medications   Medication Sig Start Date End Date Taking? Authorizing Provider  acetaminophen (TYLENOL) 325 MG tablet Take 650 mg by mouth every 6 (six) hours as needed for moderate pain (cramping).     Historical Provider, MD  doxycycline (VIBRAMYCIN) 100 MG capsule Take 100 mg by mouth 2 (two) times daily. ABT Start Date 02/06/15 & End Date 02/19/15. 02/02/15   Historical Provider, MD  dronabinol (MARINOL) 10 MG capsule Take 10 mg by mouth 2 (two) times daily. 02/04/15   Historical Provider, MD  erythromycin (ERY-TAB) 250 MG EC tablet Take 1 tablet (250 mg total) by mouth every 6 (six) hours. 02/22/15   Debbe Odea, MD  ferrous sulfate 325 (65 FE) MG tablet Take 325 mg by mouth daily.     Historical Provider, MD  fluconazole (DIFLUCAN) 100 MG tablet Take 1 tablet (100 mg total) by mouth every other day. 02/22/15   Debbe Odea, MD  furosemide (LASIX) 20 MG tablet Take 2 tablets (40 mg total) by mouth 2 (two) times daily as needed. 02/22/15   Debbe Odea, MD  insulin aspart (NOVOLOG) 100 UNIT/ML injection Inject 0-15 Units into the skin 3 (three) times daily with meals. Patient taking differently: Inject 0-15 Units into the skin 3 (three) times daily with meals. 6-15 units three times daily with food.  Per sliding scale 11/28/12   Nishant Dhungel, MD  insulin detemir (LEVEMIR) 100 UNIT/ML injection Inject 0.15 mLs (15 Units total) into the skin at bedtime. 02/17/13   Robbie Lis, MD  LORazepam (ATIVAN) 0.5 MG tablet Take 1 tablet by mouth 3  (three) times daily as needed for anxiety.  11/29/14   Historical Provider, MD  pantoprazole (PROTONIX) 40 MG tablet Take 40 mg by mouth 2 (two) times daily. 01/04/15   Historical Provider, MD  pregabalin (LYRICA) 150 MG capsule Take 1 capsule (150 mg total) by mouth 2 (two) times daily. 02/17/13   Robbie Lis, MD  promethazine (PHENERGAN) 25 MG suppository Place 1 suppository (25 mg total) rectally every 6 (six) hours as needed for nausea or vomiting. 02/22/15   Debbe Odea, MD    Physical Exam: Filed Vitals:   03/09/15 0100 03/09/15 0130 03/09/15 0304 03/09/15 0601  BP: 163/76 172/71 170/76 171/76  Pulse:   107 107  Temp:   98.7 F (37.1 C) 98.5 F (36.9 C)  TempSrc:   Oral Axillary  Resp:   18 16  Height:    5\' 2"  (1.575 m)  Weight:    62.5 kg (137 lb 12.6 oz)  SpO2:   99% 100%    Physical Exam  Constitutional: Appears well-developed and well-nourished. No distress.  HENT: Normocephalic. External right and left ear normal. Dry mucous membranes Eyes: Conjunctivae and EOM are normal. PERRLA, no scleral icterus.  Neck: Normal ROM. Neck supple. No JVD. No tracheal deviation.  No thyromegaly.  CVS: Regular rhythm, tachycardic, S1/S2 +, no murmurs, no gallops, no carotid bruit.  Pulmonary: Effort and breath sounds normal, no stridor, rhonchi, wheezes, rales.  Abdominal: Soft. BS +,  no distension, tenderness, rebound or guarding.  Musculoskeletal: Normal range of motion. No edema and no tenderness.  Lymphadenopathy: No lymphadenopathy noted, cervical, inguinal. Neuro: Alert. Normal reflexes, muscle tone coordination. No cranial nerve deficit. Skin: Skin is warm and dry. No rash noted. Not diaphoretic. No erythema. No pallor.  Psychiatric: Normal mood and affect. Behavior, judgment, thought content normal.   Labs on Admission:  Basic Metabolic Panel:  Recent Labs Lab 03/09/15 0225  NA 137  K 4.4  CL 102  CO2 20*  GLUCOSE 403*  BUN 18  CREATININE 1.07*  CALCIUM 9.5  MG 1.9    CBC:  Recent Labs Lab 03/09/15 0225  WBC 8.3  NEUTROABS 6.8  HGB 12.6  HCT 38.7  MCV 90.8  PLT 365   CBG:  Recent Labs Lab 03/09/15 0511  GLUCAP 341*    EKG: Pending   If 7PM-7AM, please contact night-coverage [email or web address] Password [email or web address] 03/09/2015, 7:31 AM

## 2015-03-10 ENCOUNTER — Inpatient Hospital Stay (HOSPITAL_COMMUNITY): Payer: 59

## 2015-03-10 ENCOUNTER — Other Ambulatory Visit (HOSPITAL_COMMUNITY): Payer: 59

## 2015-03-10 ENCOUNTER — Ambulatory Visit (HOSPITAL_COMMUNITY): Payer: 59

## 2015-03-10 DIAGNOSIS — G43A1 Cyclical vomiting, intractable: Secondary | ICD-10-CM

## 2015-03-10 DIAGNOSIS — N179 Acute kidney failure, unspecified: Secondary | ICD-10-CM

## 2015-03-10 LAB — GLUCOSE, CAPILLARY
GLUCOSE-CAPILLARY: 184 mg/dL — AB (ref 65–99)
GLUCOSE-CAPILLARY: 172 mg/dL — AB (ref 65–99)
Glucose-Capillary: 271 mg/dL — ABNORMAL HIGH (ref 65–99)
Glucose-Capillary: 233 mg/dL — ABNORMAL HIGH (ref 65–99)
Glucose-Capillary: 158 mg/dL — ABNORMAL HIGH (ref 65–99)

## 2015-03-10 LAB — COMPREHENSIVE METABOLIC PANEL
ALBUMIN: 3.3 g/dL — AB (ref 3.5–5.0)
ALT: 13 U/L — ABNORMAL LOW (ref 14–54)
AST: 15 U/L (ref 15–41)
Alkaline Phosphatase: 65 U/L (ref 38–126)
Anion gap: 9 (ref 5–15)
BILIRUBIN TOTAL: 1.4 mg/dL — AB (ref 0.3–1.2)
BUN: 16 mg/dL (ref 6–20)
CO2: 19 mmol/L — AB (ref 22–32)
Calcium: 8.6 mg/dL — ABNORMAL LOW (ref 8.9–10.3)
Chloride: 114 mmol/L — ABNORMAL HIGH (ref 101–111)
Creatinine, Ser: 1.2 mg/dL — ABNORMAL HIGH (ref 0.44–1.00)
GFR calc Af Amer: >60 mL/min (ref >60)
GFR calc non Af Amer: 53 mL/min — ABNORMAL LOW (ref >60)
GLUCOSE: 258 mg/dL — AB (ref 65–99)
POTASSIUM: 4.2 mmol/L (ref 3.5–5.1)
SODIUM: 142 mmol/L (ref 135–145)
TOTAL PROTEIN: 6.7 g/dL (ref 6.5–8.1)

## 2015-03-10 LAB — CBC
HEMATOCRIT: 39.5 % (ref 36.0–46.0)
Hemoglobin: 13.2 g/dL (ref 12.0–15.0)
MCH: 30.5 pg (ref 26.0–34.0)
MCHC: 33.4 g/dL (ref 30.0–36.0)
MCV: 91.2 fL (ref 78.0–100.0)
PLATELETS: 297 10*3/uL (ref 150–400)
RBC: 4.33 MIL/uL (ref 3.87–5.11)
RDW: 14.3 % (ref 11.5–15.5)
WBC: 16.1 10*3/uL — ABNORMAL HIGH (ref 4.0–10.5)

## 2015-03-10 LAB — LACTIC ACID, PLASMA
LACTIC ACID, VENOUS: 0.8 mmol/L (ref 0.5–2.0)
Lactic Acid, Venous: 1.3 mmol/L (ref 0.5–2.0)

## 2015-03-10 MED ORDER — IOHEXOL 300 MG/ML  SOLN
25.0000 mL | INTRAMUSCULAR | Status: AC
  Administered 2015-03-10: 25 mL via ORAL

## 2015-03-10 MED ORDER — LIDOCAINE HCL 1 % IJ SOLN
INTRAMUSCULAR | Status: AC
  Filled 2015-03-10: qty 20

## 2015-03-10 MED ORDER — SODIUM CHLORIDE 0.9 % IV SOLN
INTRAVENOUS | Status: DC
  Administered 2015-03-10 – 2015-03-12 (×4): via INTRAVENOUS

## 2015-03-10 MED ORDER — HYDROMORPHONE HCL 2 MG/ML IJ SOLN
1.0000 mg | INTRAMUSCULAR | Status: DC | PRN
  Administered 2015-03-10 – 2015-03-11 (×2): 1 mg via INTRAVENOUS
  Filled 2015-03-10 (×2): qty 1

## 2015-03-10 MED ORDER — METRONIDAZOLE IN NACL 5-0.79 MG/ML-% IV SOLN
500.0000 mg | Freq: Three times a day (TID) | INTRAVENOUS | Status: DC
  Administered 2015-03-10 – 2015-03-12 (×6): 500 mg via INTRAVENOUS
  Filled 2015-03-10 (×6): qty 100

## 2015-03-10 MED ORDER — SODIUM CHLORIDE 0.9 % IJ SOLN
10.0000 mL | INTRAMUSCULAR | Status: DC | PRN
  Administered 2015-03-10 – 2015-03-12 (×3): 10 mL
  Filled 2015-03-10 (×2): qty 40

## 2015-03-10 MED ORDER — SODIUM CHLORIDE 0.9 % IV BOLUS (SEPSIS)
1000.0000 mL | Freq: Once | INTRAVENOUS | Status: AC
  Administered 2015-03-10: 1000 mL via INTRAVENOUS

## 2015-03-10 MED ORDER — CIPROFLOXACIN IN D5W 400 MG/200ML IV SOLN
400.0000 mg | Freq: Two times a day (BID) | INTRAVENOUS | Status: DC
  Administered 2015-03-10 – 2015-03-12 (×5): 400 mg via INTRAVENOUS
  Filled 2015-03-10 (×5): qty 200

## 2015-03-10 MED ORDER — IOHEXOL 300 MG/ML  SOLN: 25.0000 mL | Freq: Once | INTRAMUSCULAR | Status: DC | PRN

## 2015-03-10 NOTE — Progress Notes (Signed)
[facility] PROGRESS NOTE  Norissa Bartee ZOX:[id number] DOB: [birth year] DOA: 03/08/2015 PCP: Barbette Merino, MD  Assessment/Plan: 49 y.o. female with diabetes mellitus, gastroparesis, diabetic neuropathy, hypertension, gastroesophageal reflux disease, constipation who was hospitalized from 8/16- 8/21 for DKA, again from 8/27 - 8/30 for NV, now presented back to [facility] ED with main concern of intractable nausea and vomiting several days in duration associated with abd cramps  1-Nausea, vomiting:  -Continue with IV fluids, Zofran PRN.  -Alkaline Phosphatase Nl,  -Had cholecystectomy in the past.  -fever, leukocytosis. Will order CT abdomen.   2-Metabolic acidosis;  -IV fluids, IV bolus. Non gap.   3-Type 1 diabetes, uncontrolled, with gastroparesis and neuropathies Continue home regimen with Levemir - Advised sliding scale insulin for better control - Continue Lyrica  4-Leukocytosis;  UA no evidence of infection,  CT abdomen and pelvis.  Blood culture.  Complaining of dysuria. IV antibiotics.   Gastroparesis due to DM - Continue Marinol per home medical regimen - Add antiemetics as needed  Acute kidney injury - Prerenal, continue IV fluids - Repeat BMP in the   Code Status: Full Code.  Family Communication: care discussed with patient.  Disposition Plan: Remain inpatient.    Consultants:  none  Procedures:  none  Antibiotics:  none  HPI/Subjective: Still with nausea, abdominal pain. dysuria  Objective: Filed Vitals:   03/10/15 0631  BP: 120/58  Pulse: 107  Temp: 98.4 F (36.9 C)  Resp: 18    Intake/Output Summary (Last 24 hours) at 03/10/15 0908 Last data filed at 03/10/15 0600  Gross per 24 hour  Intake 1558.33 ml  Output      0 ml  Net 1558.33 ml   Filed Weights   03/09/15 0601  Weight: 62.5 kg (137 lb 12.6 oz)    Exam:   General:  NAD  Cardiovascular: S 1, S 2 RRR  Respiratory: CTA  Abdomen: BS present, NT  Musculoskeletal no  edema.   Data Reviewed: Basic Metabolic Panel:  Recent Labs Lab 03/09/15 0225 03/10/15 0815  NA 137 142  K 4.4 4.2  CL 102 114*  CO2 20* 19*  GLUCOSE 403* 258*  BUN 18 16  CREATININE 1.07* 1.20*  CALCIUM 9.5 8.6*  MG 1.9  --    Liver Function Tests:  Recent Labs Lab 03/10/15 0815  AST 15  ALT 13*  ALKPHOS 65  BILITOT 1.4*  PROT 6.7  ALBUMIN 3.3*   No results for input(s): LIPASE, AMYLASE in the last 168 hours. No results for input(s): AMMONIA in the last 168 hours. CBC:  Recent Labs Lab 03/09/15 0225 03/10/15 0425  WBC 8.3 16.1*  NEUTROABS 6.8  --   HGB 12.6 13.2  HCT 38.7 39.5  MCV 90.8 91.2  PLT 365 297   Cardiac Enzymes: No results for input(s): CKTOTAL, CKMB, CKMBINDEX, TROPONINI in the last 168 hours. BNP (last 3 results) No results for input(s): BNP in the last 8760 hours.  ProBNP (last 3 results) No results for input(s): PROBNP in the last 8760 hours.  CBG:  Recent Labs Lab 03/09/15 1134 03/09/15 1742 03/09/15 2025 03/09/15 2348 03/10/15 0735  GLUCAP 320* 271* 214* 287* 233*    No results found for this or any previous visit (from the past 240 hour(s)).   Studies: No results found.  Scheduled Meds: . dronabinol  10 mg Oral BID  . enoxaparin (LOVENOX) injection  40 mg Subcutaneous Q24H  . ferrous sulfate  325 mg Oral Daily  . insulin aspart  0-9  Units Subcutaneous TID WC  . insulin detemir  15 Units Subcutaneous QHS  . pantoprazole  40 mg Oral BID  . pregabalin  150 mg Oral BID  . sodium chloride  3 mL Intravenous Q12H   Continuous Infusions: . sodium chloride 75 mL/hr at 03/09/15 2020    Principal Problem:   Nausea and vomiting Active Problems:   Diabetic neuropathy   Type 1 diabetes, uncontrolled, with gastroparesis   Acute kidney injury    Time spent: 35 minutes.     Niel Hummer A  [facility] Pager [phone]. If 7PM-7AM, please contact night-coverage at [email or web address], password [facility] 03/10/2015,  9:08 AM  LOS: 1 day

## 2015-03-10 NOTE — Progress Notes (Signed)
Pt returned from ct and IR after having both procedures sone. Pt still sleepy but verbally responsive.

## 2015-03-11 DIAGNOSIS — E1065 Type 1 diabetes mellitus with hyperglycemia: Secondary | ICD-10-CM

## 2015-03-11 DIAGNOSIS — K3184 Gastroparesis: Secondary | ICD-10-CM

## 2015-03-11 DIAGNOSIS — E1043 Type 1 diabetes mellitus with diabetic autonomic (poly)neuropathy: Principal | ICD-10-CM

## 2015-03-11 DIAGNOSIS — R112 Nausea with vomiting, unspecified: Secondary | ICD-10-CM

## 2015-03-11 LAB — CBC
HEMATOCRIT: 32.5 % — AB (ref 36.0–46.0)
HEMOGLOBIN: 10.5 g/dL — AB (ref 12.0–15.0)
MCH: 29.1 pg (ref 26.0–34.0)
MCHC: 32.3 g/dL (ref 30.0–36.0)
MCV: 90 fL (ref 78.0–100.0)
Platelets: 286 10*3/uL (ref 150–400)
RBC: 3.61 MIL/uL — ABNORMAL LOW (ref 3.87–5.11)
RDW: 14.3 % (ref 11.5–15.5)
WBC: 8.5 10*3/uL (ref 4.0–10.5)

## 2015-03-11 LAB — BASIC METABOLIC PANEL
Anion gap: 6 (ref 5–15)
BUN: 11 mg/dL (ref 6–20)
CHLORIDE: 113 mmol/L — AB (ref 101–111)
CO2: 22 mmol/L (ref 22–32)
CREATININE: 0.98 mg/dL (ref 0.44–1.00)
Calcium: 8 mg/dL — ABNORMAL LOW (ref 8.9–10.3)
GFR calc Af Amer: >60 mL/min (ref >60)
GFR calc non Af Amer: >60 mL/min (ref >60)
GLUCOSE: 226 mg/dL — AB (ref 65–99)
Potassium: 3.6 mmol/L (ref 3.5–5.1)
Sodium: 141 mmol/L (ref 135–145)

## 2015-03-11 LAB — GLUCOSE, CAPILLARY
GLUCOSE-CAPILLARY: 158 mg/dL — AB (ref 65–99)
Glucose-Capillary: 197 mg/dL — ABNORMAL HIGH (ref 65–99)
Glucose-Capillary: 138 mg/dL — ABNORMAL HIGH (ref 65–99)
Glucose-Capillary: 99 mg/dL (ref 65–99)

## 2015-03-11 MED ORDER — LORAZEPAM 0.5 MG PO TABS: 0.5000 mg | ORAL_TABLET | Freq: Three times a day (TID) | ORAL | Status: DC | PRN

## 2015-03-11 MED ORDER — INSULIN DETEMIR 100 UNIT/ML ~~LOC~~ SOLN
17.0000 [IU] | Freq: Every day | SUBCUTANEOUS | Status: DC
  Administered 2015-03-11: 17 [IU] via SUBCUTANEOUS
  Filled 2015-03-11: qty 0.17

## 2015-03-11 NOTE — Progress Notes (Signed)
Inpatient Diabetes Program Recommendations  AACE/ADA: New Consensus Statement on Inpatient Glycemic Control (2015)  Target Ranges:  Prepandial:   less than 140 mg/dL      Peak postprandial:   less than 180 mg/dL (1-2 hours)      Critically ill patients:  140 - 180 mg/dL   Review of Glycemic Control  Results for [name], [name] (MRN [id number]) as of 03/11/2015 09:53  Ref. Range 03/09/2015 02:25 03/10/2015 08:15 03/11/2015 04:48  Glucose Latest Ref Range: 65-99 mg/dL 403 (H) 258 (H) 226 (H)   FBS >200 mg/dL x 3 days.  Inpatient Diabetes Program Recommendations:    Increase Levemir to 17 units QHS  Will continue to follow. Thank you. Lorenda Peck, RD, LDN, CDE Inpatient Diabetes Coordinator [phone]

## 2015-03-11 NOTE — Progress Notes (Signed)
[facility] PROGRESS NOTE  Suhey Radford DJM:[id number] DOB: 01/22/1966 DOA: 03/08/2015 PCP: Barbette Merino, MD  Assessment/Plan: 49 y.o. female with diabetes mellitus, gastroparesis, diabetic neuropathy, hypertension, gastroesophageal reflux disease, constipation who was hospitalized from 8/16- 8/21 for DKA, again from 8/27 - 8/30 for NV, now presented back to [facility] ED with main concern of intractable nausea and vomiting several days in duration associated with abd cramps  1-Nausea, vomiting:  -Continue with IV fluids, Zofran PRN.  -Alkaline Phosphatase Nl,  -Had cholecystectomy in the past.  -Improved.   2-Metabolic acidosis;  -IV fluids, IV bolus. Non gap.  -improved with IV fluids.   3-Type 1 diabetes, uncontrolled, with gastroparesis and neuropathies Continue  with Levemir , increase to 17 units.  - Advised sliding scale insulin for better control - Continue Lyrica  4-Leukocytosis;  UA no evidence of infection,  CT abdomen and pelvis with possible colitis.  Blood culture no growth to date. .  Complaining of dysuria. IV antibiotics.   Possible colitis:  CT abdomen and pelvis with possible colitis.  Continue with flagyl and ciprofloxacin.  WBC trending down.   Gastroparesis due to DM - Continue Marinol per home medical regimen - Add antiemetics as needed  Acute kidney injury - Prerenal, continue IV fluids -improved with iv fluids.   Anemia; chronic. Suspect initial hb on admission related to hemoconcentration. Prior Hb per records at 10---11.   Code Status: Full Code.  Family Communication: care discussed with patient.  Disposition Plan: Remain inpatient.    Consultants:  none  Procedures:  none  Antibiotics:  cipro and flagyl 9-15  HPI/Subjective: She is feeling better, no abdominal pain, more alert today. Will try to eat today . No vomiting.   Objective: Filed Vitals:   03/11/15 0435  BP: 118/55  Pulse: 90  Temp: 98.6 F (37 C)  Resp: 18     Intake/Output Summary (Last 24 hours) at 03/11/15 1130 Last data filed at 03/11/15 0242  Gross per 24 hour  Intake 2636.25 ml  Output   1200 ml  Net 1436.25 ml   Filed Weights   03/09/15 0601  Weight: 62.5 kg (137 lb 12.6 oz)    Exam:   General:  NAD  Cardiovascular: S 1, S 2 RRR  Respiratory: CTA  Abdomen: BS present, NT  Musculoskeletal no edema.   Data Reviewed: Basic Metabolic Panel:  Recent Labs Lab 03/09/15 0225 03/10/15 0815 03/11/15 0448  NA 137 142 141  K 4.4 4.2 3.6  CL 102 114* 113*  CO2 20* 19* 22  GLUCOSE 403* 258* 226*  BUN 18 16 11   CREATININE 1.07* 1.20* 0.98  CALCIUM 9.5 8.6* 8.0*  MG 1.9  --   --    Liver Function Tests:  Recent Labs Lab 03/10/15 0815  AST 15  ALT 13*  ALKPHOS 65  BILITOT 1.4*  PROT 6.7  ALBUMIN 3.3*   No results for input(s): LIPASE, AMYLASE in the last 168 hours. No results for input(s): AMMONIA in the last 168 hours. CBC:  Recent Labs Lab 03/09/15 0225 03/10/15 0425 03/11/15 0448  WBC 8.3 16.1* 8.5  NEUTROABS 6.8  --   --   HGB 12.6 13.2 10.5*  HCT 38.7 39.5 32.5*  MCV 90.8 91.2 90.0  PLT 365 297 286   Cardiac Enzymes: No results for input(s): CKTOTAL, CKMB, CKMBINDEX, TROPONINI in the last 168 hours. BNP (last 3 results) No results for input(s): BNP in the last 8760 hours.  ProBNP (last 3 results) No results for  input(s): PROBNP in the last 8760 hours.  CBG:  Recent Labs Lab 03/10/15 0735 03/10/15 1147 03/10/15 1657 03/10/15 2207 03/11/15 0818  GLUCAP 233* 184* 172* 158* 158*    Recent Results (from the past 240 hour(s))  Culture, blood (routine x 2)     Status: None (Preliminary result)   Collection Time: 03/10/15 11:05 AM  Result Value Ref Range Status   Specimen Description BLOOD RIGHT HAND  Final   Special Requests BOTTLES DRAWN AEROBIC ONLY 2CC  Final   Culture   Final    NO GROWTH < 24 HOURS Performed at [facility]    Report Status PENDING  Incomplete   Culture, blood (routine x 2)     Status: None (Preliminary result)   Collection Time: 03/10/15 11:20 AM  Result Value Ref Range Status   Specimen Description BLOOD LEFT HAND  Final   Special Requests BOTTLES DRAWN AEROBIC ONLY 3CC  Final   Culture   Final    NO GROWTH < 24 HOURS Performed at [facility]    Report Status PENDING  Incomplete  Urine culture     Status: None (Preliminary result)   Collection Time: 03/10/15  1:42 PM  Result Value Ref Range Status   Specimen Description URINE, CLEAN CATCH  Final   Special Requests NONE  Final   Culture   Final    NO GROWTH < 24 HOURS Performed at [facility]    Report Status PENDING  Incomplete     Studies: Ct Abdomen Pelvis Wo Contrast  03/10/2015   CLINICAL DATA:  Diabetes. Constipation. Gastro paresis. Currently presents with intractable nausea and vomiting for several days with associated abdominal cramps.  EXAM: CT ABDOMEN AND PELVIS WITHOUT CONTRAST  TECHNIQUE: Multidetector CT imaging of the abdomen and pelvis was performed following the standard protocol without IV contrast.  COMPARISON:  11/15/2014  FINDINGS: Exam detail is diminished secondary to lack of IV contrast material.  Lower chest: The lung bases are clear. No pleural effusion identified.  Hepatobiliary: Previous cholecystectomy. The liver appears normal. No biliary dilatation identified.  Pancreas: Unremarkable appearance of the pancreas.  Spleen: The spleen is unremarkable.  Adrenals/Urinary Tract: Unremarkable appearance of the adrenal glands. Normal appearance of the kidneys. No hydronephrosis or hydroureter identified. There is moderate distension of the urinary bladder.  Stomach/Bowel: The stomach is normal. The small bowel loops have a normal caliber. There is no evidence for small bowel obstruction. The appendix is visualized and appears normal. Evaluation of the colon is diminished due to lack of both IV contrast material and oral contrast within the  colon. Within this limitation there may be mild diffuse colonic wall thickening versus colonic under distention. No evidence for large bowel obstruction. No pneumatosis or bowel perforation.  Vascular/Lymphatic: Calcified atherosclerotic disease involves the abdominal aorta. No aneurysm. No enlarged retroperitoneal or mesenteric adenopathy. No enlarged pelvic or inguinal lymph nodes.  Reproductive: The uterus and the adnexal structures are unremarkable.  Other: No free fluid or fluid collections identified within the abdomen or pelvis.  Musculoskeletal: No aggressive lytic or sclerotic bone lesions.  IMPRESSION: 1. Exam detail diminished due to lack of IV contrast material. Additionally, the oral contrast material fills only the normal appearing small bowel loops. 2. Incomplete distention versus mild wall thickening involving the colon which may indicate colitis. No pneumatosis, abnormal bowel distention or bowel perforation identified. 3. Previous cholecystectomy 4. Aortic atherosclerosis.   Electronically Signed   By: Queen Slough.D.  On: 03/10/2015 16:31   Ir Fluoro Guide Cv Line Right  03/10/2015   CLINICAL DATA:  Nausea vomiting.  Needs  IV access.  EXAM: PICC PLACEMENT WITH ULTRASOUND AND FLUOROSCOPY  FLUOROSCOPY TIME:  6 seconds, 3 mGy  TECHNIQUE: After written informed consent was obtained, patient was placed in the supine position on angiographic table. Patency of the right basilic vein was confirmed with ultrasound with image documentation. An appropriate skin site was determined. Skin site was marked. Region was prepped using maximum barrier technique including cap and mask, sterile gown, sterile gloves, large sterile sheet, and Chlorhexidine as cutaneous antisepsis. The region was infiltrated locally with 1% lidocaine. Under real-time ultrasound guidance, the right basilic vein was accessed with a 21 gauge micropuncture needle; the needle tip within the vein was confirmed with ultrasound image  documentation. Needle exchanged over a 018 guidewire for a peel-away sheath, through which a 5-French double-lumen power injectable PICC trimmed to 33cm was advanced, positioned with its tip near the cavoatrial junction. Spot chest radiograph confirms appropriate catheter position. Catheter was flushed per protocol and secured externally. The patient tolerated procedure well.  COMPLICATIONS: COMPLICATIONS none  IMPRESSION: 1. Technically successful five [place] double lumen power injectable PICC placement   Electronically Signed   By: Lucrezia Europe M.D.   On: 03/10/2015 17:01   Ir US Guide Vasc Access Right  03/10/2015   CLINICAL DATA:  Nausea vomiting.  Needs  IV access.  EXAM: PICC PLACEMENT WITH ULTRASOUND AND FLUOROSCOPY  FLUOROSCOPY TIME:  6 seconds, 3 mGy  TECHNIQUE: After written informed consent was obtained, patient was placed in the supine position on angiographic table. Patency of the right basilic vein was confirmed with ultrasound with image documentation. An appropriate skin site was determined. Skin site was marked. Region was prepped using maximum barrier technique including cap and mask, sterile gown, sterile gloves, large sterile sheet, and Chlorhexidine as cutaneous antisepsis. The region was infiltrated locally with 1% lidocaine. Under real-time ultrasound guidance, the right basilic vein was accessed with a 21 gauge micropuncture needle; the needle tip within the vein was confirmed with ultrasound image documentation. Needle exchanged over a 018 guidewire for a peel-away sheath, through which a 5-French double-lumen power injectable PICC trimmed to 33cm was advanced, positioned with its tip near the cavoatrial junction. Spot chest radiograph confirms appropriate catheter position. Catheter was flushed per protocol and secured externally. The patient tolerated procedure well.  COMPLICATIONS: COMPLICATIONS none  IMPRESSION: 1. Technically successful five [place] double lumen power injectable PICC  placement   Electronically Signed   By: Lucrezia Europe M.D.   On: 03/10/2015 17:01    Scheduled Meds: . ciprofloxacin  400 mg Intravenous Q12H  . dronabinol  10 mg Oral BID  . enoxaparin (LOVENOX) injection  40 mg Subcutaneous Q24H  . ferrous sulfate  325 mg Oral Daily  . insulin aspart  0-9 Units Subcutaneous TID WC  . insulin detemir  15 Units Subcutaneous QHS  . metronidazole  500 mg Intravenous Q8H  . pantoprazole  40 mg Oral BID  . pregabalin  150 mg Oral BID  . sodium chloride  3 mL Intravenous Q12H   Continuous Infusions: . sodium chloride 125 mL/hr at 03/11/15 0242    Principal Problem:   Nausea and vomiting Active Problems:   Diabetic neuropathy   Type 1 diabetes, uncontrolled, with gastroparesis   Acute kidney injury    Time spent: 35 minutes.     Niel Hummer A  [facility] Pager [phone].  If 7PM-7AM, please contact night-coverage at [email or web address], password [facility] 03/11/2015, 11:30 AM  LOS: 2 days

## 2015-03-11 NOTE — Progress Notes (Signed)
Pt more awake and interactive today. Pt ate breakfast and lunch and tolerated same well. No nausea or vomiting. No complains of abdominal pain

## 2015-03-12 DIAGNOSIS — K529 Noninfective gastroenteritis and colitis, unspecified: Secondary | ICD-10-CM

## 2015-03-12 LAB — GLUCOSE, CAPILLARY
GLUCOSE-CAPILLARY: 80 mg/dL (ref 65–99)
GLUCOSE-CAPILLARY: 147 mg/dL — AB (ref 65–99)
Glucose-Capillary: 66 mg/dL (ref 65–99)

## 2015-03-12 LAB — URINE CULTURE

## 2015-03-12 MED ORDER — METRONIDAZOLE 500 MG PO TABS
500.0000 mg | ORAL_TABLET | Freq: Three times a day (TID) | ORAL | Status: DC
  Administered 2015-03-12: 500 mg via ORAL
  Filled 2015-03-12: qty 1

## 2015-03-12 MED ORDER — INSULIN DETEMIR 100 UNIT/ML ~~LOC~~ SOLN
15.0000 [IU] | Freq: Every day | SUBCUTANEOUS | Status: DC
  Filled 2015-03-12: qty 0.15

## 2015-03-12 MED ORDER — ONDANSETRON HCL 4 MG PO TABS: 4.0000 mg | ORAL_TABLET | Freq: Three times a day (TID) | ORAL | Status: DC | PRN

## 2015-03-12 MED ORDER — CIPROFLOXACIN HCL 500 MG PO TABS: 500.0000 mg | ORAL_TABLET | Freq: Two times a day (BID) | ORAL | Status: DC

## 2015-03-12 MED ORDER — METRONIDAZOLE 500 MG PO TABS: 500.0000 mg | ORAL_TABLET | Freq: Three times a day (TID) | ORAL | Status: DC

## 2015-03-12 MED ORDER — INSULIN ASPART 100 UNIT/ML ~~LOC~~ SOLN: 0.0000 [IU] | Freq: Three times a day (TID) | SUBCUTANEOUS | Status: DC

## 2015-03-12 NOTE — Discharge Summary (Signed)
Physician Discharge Summary  [name] AGT:[id number] DOB: March 30, 1966 DOA: 03/08/2015  PCP: Barbette Merino, MD  Admit date: 03/08/2015 Discharge date: 03/12/2015  Time spent: 35 minutes  Recommendations for Outpatient Follow-up:  1. Needs referral to GI, will need screening colonoscopy  Discharge Diagnoses:    Acute colitis    Nausea and vomiting   Diabetic neuropathy   Type 1 diabetes, uncontrolled, with gastroparesis   Acute kidney injury   Colitis   Discharge Condition: Stable.   Diet recommendation: Heart Healthy  Filed Weights   03/09/15 0601  Weight: 62.5 kg (137 lb 12.6 oz)    History of present illness:  49 y.o. female with diabetes mellitus, gastroparesis, diabetic neuropathy, hypertension, gastroesophageal reflux disease, constipation who was hospitalized from 8/16- 8/21 for DKA, again from 8/27 - 8/30 for NV, now presented back to [facility] ED with main concern of intractable nausea and vomiting several days in duration associated with abd cramps. Pt describes cramps as throbbing and intermittent, 7/10 in severity,worse with vomiting episodes, no specific alleviating or aggravating factors, non radiating. Pt describes this episode as typical for her gastroparesis flare. Pt denies recent sick contacts, no fevers, chills, no chest pain or shortness of breath.   In Ed, pt noted to be hemodynamically stable with VS notable for HR up to 120's, otherwise stable, blood work notable for Cr 1.07 and Glucose 403. TRH asked to admit for further evaluation.   Hospital Course:  Assessment/Plan: 49 y.o. female with diabetes mellitus, gastroparesis, diabetic neuropathy, hypertension, gastroesophageal reflux disease, constipation who was hospitalized from 8/16- 8/21 for DKA, again from 8/27 - 8/30 for NV, now presented back to [facility] ED with main concern of intractable nausea and vomiting several days in duration associated with abd cramps  1-Nausea, vomiting:  -Continue with IV fluids, Zofran  PRN.  -Alkaline Phosphatase Nl,  -Had cholecystectomy in the past.  -Improved. In setting of gastroparesis, colitis.  Tolerating diet.   2-Metabolic acidosis;  -IV fluids, IV bolus. Non gap.  -improved with IV fluids.   3-Type 1 diabetes, uncontrolled, with gastroparesis and neuropathies Continue with Levemir ,15 units.  - Advised sliding scale insulin for better control - Continue Lyrica  4-Leukocytosis;  UA no evidence of infection,  CT abdomen and pelvis with possible colitis.  Blood culture no growth to date. .  Complaining of dysuria. IV antibiotics.  Discharge on Flagyl and cipro for 7 more days.   Possible colitis:  CT abdomen and pelvis with possible colitis.  Continue with flagyl and ciprofloxacin. will provide 7 more days treatment.  WBC trending down.   Gastroparesis due to DM - Continue Marinol per home medical regimen - Add antiemetics as needed  Acute kidney injury - Prerenal, continue IV fluids -improved with iv fluids.   Anemia; chronic. Suspect initial hb on admission related to hemoconcentration. Prior Hb per records at 10---11.   Procedures:  none  Consultations:  none  Discharge Exam: Filed Vitals:   03/12/15 0615  BP: 106/50  Pulse: 76  Temp: 98 F (36.7 C)  Resp: 20    General: NAD Cardiovascular: S 1, S 2 RRR Respiratory: CTA Abdomen soft, NT  Discharge Instructions   Discharge Instructions    Diet Carb Modified    Complete by:  As directed      Increase activity slowly    Complete by:  As directed           Current Discharge Medication List    START taking these medications  Details  ciprofloxacin (CIPRO) 500 MG tablet Take 1 tablet (500 mg total) by mouth 2 (two) times daily. Qty: 14 tablet, Refills: 0    metroNIDAZOLE (FLAGYL) 500 MG tablet Take 1 tablet (500 mg total) by mouth every 8 (eight) hours. Qty: 21 tablet, Refills: 0      CONTINUE these medications which have CHANGED   Details  insulin  aspart (NOVOLOG) 100 UNIT/ML injection Inject 0-15 Units into the skin 3 (three) times daily with meals. 6-15 units three times daily with food.  Per sliding scale Qty: 1 vial, Refills: 12      CONTINUE these medications which have NOT CHANGED   Details  acetaminophen (TYLENOL) 325 MG tablet Take 650 mg by mouth every 6 (six) hours as needed for moderate pain (cramping).     dronabinol (MARINOL) 10 MG capsule Take 10 mg by mouth 2 (two) times daily.    ferrous sulfate 325 (65 FE) MG tablet Take 325 mg by mouth daily.     insulin detemir (LEVEMIR) 100 UNIT/ML injection Inject 0.15 mLs (15 Units total) into the skin at bedtime. Qty: 10 mL, Refills: 3    LORazepam (ATIVAN) 0.5 MG tablet Take 1 tablet by mouth 3 (three) times daily as needed for anxiety.     pantoprazole (PROTONIX) 40 MG tablet Take 40 mg by mouth 2 (two) times daily.    pregabalin (LYRICA) 150 MG capsule Take 1 capsule (150 mg total) by mouth 2 (two) times daily. Qty: 60 capsule, Refills: 0      STOP taking these medications     furosemide (LASIX) 20 MG tablet        Allergies  Allergen Reactions  . Anesthetics, Amide Nausea And Vomiting  . Penicillins Diarrhea and Nausea And Vomiting    Has patient had a PCN reaction causing immediate rash, facial/tongue/throat swelling, SOB or lightheadedness with hypotension: No Has patient had a PCN reaction causing severe rash involving mucus membranes or skin necrosis: No Has patient had a PCN reaction that required hospitalization Yes  Has patient had a PCN reaction occurring within the last 10 years: Yes  If all of the above answers are "NO", then may proceed with Cephalosporin use.   . Buprenorphine Hcl Rash  . Encainide Nausea And Vomiting  . Metoclopramide Rash  . Morphine And Related Rash   Follow-up Information    Follow up with Barbette Merino, MD.   Specialty:  Internal Medicine   Contact information:   [address] [place] [id number] [phone]         The results of significant diagnostics from this hospitalization (including imaging, microbiology, ancillary and laboratory) are listed below for reference.    Significant Diagnostic Studies: Ct Abdomen Pelvis Wo Contrast  03/10/2015   CLINICAL DATA:  Diabetes. Constipation. Gastro paresis. Currently presents with intractable nausea and vomiting for several days with associated abdominal cramps.  EXAM: CT ABDOMEN AND PELVIS WITHOUT CONTRAST  TECHNIQUE: Multidetector CT imaging of the abdomen and pelvis was performed following the standard protocol without IV contrast.  COMPARISON:  11/15/2014  FINDINGS: Exam detail is diminished secondary to lack of IV contrast material.  Lower chest: The lung bases are clear. No pleural effusion identified.  Hepatobiliary: Previous cholecystectomy. The liver appears normal. No biliary dilatation identified.  Pancreas: Unremarkable appearance of the pancreas.  Spleen: The spleen is unremarkable.  Adrenals/Urinary Tract: Unremarkable appearance of the adrenal glands. Normal appearance of the kidneys. No hydronephrosis or hydroureter identified. There is moderate distension of the  urinary bladder.  Stomach/Bowel: The stomach is normal. The small bowel loops have a normal caliber. There is no evidence for small bowel obstruction. The appendix is visualized and appears normal. Evaluation of the colon is diminished due to lack of both IV contrast material and oral contrast within the colon. Within this limitation there may be mild diffuse colonic wall thickening versus colonic under distention. No evidence for large bowel obstruction. No pneumatosis or bowel perforation.  Vascular/Lymphatic: Calcified atherosclerotic disease involves the abdominal aorta. No aneurysm. No enlarged retroperitoneal or mesenteric adenopathy. No enlarged pelvic or inguinal lymph nodes.  Reproductive: The uterus and the adnexal structures are unremarkable.  Other: No free fluid or fluid collections  identified within the abdomen or pelvis.  Musculoskeletal: No aggressive lytic or sclerotic bone lesions.  IMPRESSION: 1. Exam detail diminished due to lack of IV contrast material. Additionally, the oral contrast material fills only the normal appearing small bowel loops. 2. Incomplete distention versus mild wall thickening involving the colon which may indicate colitis. No pneumatosis, abnormal bowel distention or bowel perforation identified. 3. Previous cholecystectomy 4. Aortic atherosclerosis.   Electronically Signed   By: Kerby Moors M.D.   On: 03/10/2015 16:31   Ir Fluoro Guide Cv Line Right  03/10/2015   CLINICAL DATA:  Nausea vomiting.  Needs  IV access.  EXAM: PICC PLACEMENT WITH ULTRASOUND AND FLUOROSCOPY  FLUOROSCOPY TIME:  6 seconds, 3 mGy  TECHNIQUE: After written informed consent was obtained, patient was placed in the supine position on angiographic table. Patency of the right basilic vein was confirmed with ultrasound with image documentation. An appropriate skin site was determined. Skin site was marked. Region was prepped using maximum barrier technique including cap and mask, sterile gown, sterile gloves, large sterile sheet, and Chlorhexidine as cutaneous antisepsis. The region was infiltrated locally with 1% lidocaine. Under real-time ultrasound guidance, the right basilic vein was accessed with a 21 gauge micropuncture needle; the needle tip within the vein was confirmed with ultrasound image documentation. Needle exchanged over a 018 guidewire for a peel-away sheath, through which a 5-French double-lumen power injectable PICC trimmed to 33cm was advanced, positioned with its tip near the cavoatrial junction. Spot chest radiograph confirms appropriate catheter position. Catheter was flushed per protocol and secured externally. The patient tolerated procedure well.  COMPLICATIONS: COMPLICATIONS none  IMPRESSION: 1. Technically successful five [place] double lumen power injectable PICC  placement   Electronically Signed   By: Lucrezia Europe M.D.   On: 03/10/2015 17:01   Ir US Guide Vasc Access Right  03/10/2015   CLINICAL DATA:  Nausea vomiting.  Needs  IV access.  EXAM: PICC PLACEMENT WITH ULTRASOUND AND FLUOROSCOPY  FLUOROSCOPY TIME:  6 seconds, 3 mGy  TECHNIQUE: After written informed consent was obtained, patient was placed in the supine position on angiographic table. Patency of the right basilic vein was confirmed with ultrasound with image documentation. An appropriate skin site was determined. Skin site was marked. Region was prepped using maximum barrier technique including cap and mask, sterile gown, sterile gloves, large sterile sheet, and Chlorhexidine as cutaneous antisepsis. The region was infiltrated locally with 1% lidocaine. Under real-time ultrasound guidance, the right basilic vein was accessed with a 21 gauge micropuncture needle; the needle tip within the vein was confirmed with ultrasound image documentation. Needle exchanged over a 018 guidewire for a peel-away sheath, through which a 5-French double-lumen power injectable PICC trimmed to 33cm was advanced, positioned with its tip near the cavoatrial junction. Spot chest  radiograph confirms appropriate catheter position. Catheter was flushed per protocol and secured externally. The patient tolerated procedure well.  COMPLICATIONS: COMPLICATIONS none  IMPRESSION: 1. Technically successful five [place] double lumen power injectable PICC placement   Electronically Signed   By: Lucrezia Europe M.D.   On: 03/10/2015 17:01   Dg Abd Acute W/chest  02/19/2015   CLINICAL DATA:  Nausea and vomiting. Generalized abdominal pain today. Abdominal distention. History of gastric paresis. Diabetes.  EXAM: DG ABDOMEN ACUTE W/ 1V CHEST  COMPARISON:  Abdomen 02/12/2015. Chest 11/15/2014. CT abdomen and pelvis 11/15/2014.  FINDINGS: Normal heart size and pulmonary vascularity. No focal airspace disease or consolidation in the lungs. No blunting of  costophrenic angles. No pneumothorax. Mediastinal contours appear intact.  Stool-filled colon. No small or large bowel distention. No free intra-abdominal air. No abnormal air-fluid levels. No radiopaque stones. Visualized bones appear intact. Surgical clips in the right upper quadrant.  IMPRESSION: No evidence of active pulmonary disease. Normal nonobstructive bowel gas pattern. Stool-filled colon.   Electronically Signed   By: Lucienne Capers M.D.   On: 02/19/2015 20:47   Dg Abd Portable 1v  02/12/2015   CLINICAL DATA:  Abdominal pain for 1 day  EXAM: PORTABLE ABDOMEN - 1 VIEW  COMPARISON:  11/15/2014  FINDINGS: Scattered large and small bowel gas is noted. Changes consistent with prior cholecystectomy are seen. No bony abnormality is seen. No abnormal mass or abnormal calcifications are noted.  IMPRESSION: No acute abnormality noted.   Electronically Signed   By: Inez Catalina M.D.   On: 02/12/2015 10:18    Microbiology: Recent Results (from the past 240 hour(s))  Culture, blood (routine x 2)     Status: None (Preliminary result)   Collection Time: 03/10/15 11:05 AM  Result Value Ref Range Status   Specimen Description BLOOD RIGHT HAND  Final   Special Requests BOTTLES DRAWN AEROBIC ONLY 2CC  Final   Culture   Final    NO GROWTH 1 DAY Performed at [facility]    Report Status PENDING  Incomplete  Culture, blood (routine x 2)     Status: None (Preliminary result)   Collection Time: 03/10/15 11:20 AM  Result Value Ref Range Status   Specimen Description BLOOD LEFT HAND  Final   Special Requests BOTTLES DRAWN AEROBIC ONLY 3CC  Final   Culture   Final    NO GROWTH 1 DAY Performed at [facility]    Report Status PENDING  Incomplete  Urine culture     Status: None   Collection Time: 03/10/15  1:42 PM  Result Value Ref Range Status   Specimen Description URINE, CLEAN CATCH  Final   Special Requests NONE  Final   Culture   Final    MULTIPLE SPECIES PRESENT, SUGGEST  RECOLLECTION Performed at [facility]    Report Status 03/12/2015 FINAL  Final     Labs: Basic Metabolic Panel:  Recent Labs Lab 03/09/15 0225 03/10/15 0815 03/11/15 0448  NA 137 142 141  K 4.4 4.2 3.6  CL 102 114* 113*  CO2 20* 19* 22  GLUCOSE 403* 258* 226*  BUN 18 16 11   CREATININE 1.07* 1.20* 0.98  CALCIUM 9.5 8.6* 8.0*  MG 1.9  --   --    Liver Function Tests:  Recent Labs Lab 03/10/15 0815  AST 15  ALT 13*  ALKPHOS 65  BILITOT 1.4*  PROT 6.7  ALBUMIN 3.3*   No results for input(s): LIPASE, AMYLASE in  the last 168 hours. No results for input(s): AMMONIA in the last 168 hours. CBC:  Recent Labs Lab 03/09/15 0225 03/10/15 0425 03/11/15 0448  WBC 8.3 16.1* 8.5  NEUTROABS 6.8  --   --   HGB 12.6 13.2 10.5*  HCT 38.7 39.5 32.5*  MCV 90.8 91.2 90.0  PLT 365 297 286   Cardiac Enzymes: No results for input(s): CKTOTAL, CKMB, CKMBINDEX, TROPONINI in the last 168 hours. BNP: BNP (last 3 results) No results for input(s): BNP in the last 8760 hours.  ProBNP (last 3 results) No results for input(s): PROBNP in the last 8760 hours.  CBG:  Recent Labs Lab 03/11/15 1712 03/11/15 2143 03/12/15 0726 03/12/15 0748 03/12/15 1207  GLUCAP 138* 99 66 80 147*       Signed:  Regalado, Belkys A  [facility] 03/12/2015, 12:50 PM

## 2015-03-12 NOTE — Progress Notes (Signed)
PT Cancellation Note  Patient Details Name: Lennix Rotundo MRN: [id number] DOB: 1965/09/05   Cancelled Treatment:    Reason Eval/Treat Not Completed: Other (comment);PT screened, no needs identified, will sign off (RN called and stated patient refuses PT, is to DC  today. )   Claretha Cooper 03/12/2015, 3:25 PM

## 2015-03-12 NOTE — Progress Notes (Signed)
Patient refused to be seen by physical therapy prior to discharge. MD and PT made aware.

## 2015-03-12 NOTE — Progress Notes (Signed)
03/12/2015 1515 NCM received call from [facility] and pt has copay of $21.00 per visit. Pt refusing [facility] RN at this time. States she cannot afford at this time. She is receiving her short term disability benefits and income is limited. Jonnie Finner RN CCM Case Mgmt phone [phone]  03/12/2015 1430  NCM spoke to pt and offered choice for [facility]. Pt requested [facility] for [facility] RN. Contacted [facility] for [facility] RN. [facility] will check for copay price and get back with pt. Jonnie Finner RN CCM Case Mgmt phone [phone]

## 2015-03-13 ENCOUNTER — Emergency Department (HOSPITAL_COMMUNITY)
Admission: EM | Admit: 2015-03-13 | Discharge: 2015-03-13 | Disposition: A | Discharge status: Home / Self Care | Payer: 59 | Attending: Emergency Medicine | Admitting: Emergency Medicine

## 2015-03-13 ENCOUNTER — Encounter (HOSPITAL_COMMUNITY): Payer: Self-pay | Admitting: Emergency Medicine

## 2015-03-13 DIAGNOSIS — Z880 Allergy status to penicillin: Secondary | ICD-10-CM | POA: Insufficient documentation

## 2015-03-13 DIAGNOSIS — E119 Type 2 diabetes mellitus without complications: Secondary | ICD-10-CM | POA: Diagnosis not present

## 2015-03-13 DIAGNOSIS — D649 Anemia, unspecified: Secondary | ICD-10-CM | POA: Diagnosis not present

## 2015-03-13 DIAGNOSIS — J45909 Unspecified asthma, uncomplicated: Secondary | ICD-10-CM | POA: Diagnosis not present

## 2015-03-13 DIAGNOSIS — I10 Essential (primary) hypertension: Secondary | ICD-10-CM | POA: Insufficient documentation

## 2015-03-13 DIAGNOSIS — Z794 Long term (current) use of insulin: Secondary | ICD-10-CM | POA: Insufficient documentation

## 2015-03-13 DIAGNOSIS — Z79899 Other long term (current) drug therapy: Secondary | ICD-10-CM | POA: Insufficient documentation

## 2015-03-13 DIAGNOSIS — Z8669 Personal history of other diseases of the nervous system and sense organs: Secondary | ICD-10-CM | POA: Insufficient documentation

## 2015-03-13 DIAGNOSIS — K3184 Gastroparesis: Secondary | ICD-10-CM | POA: Diagnosis not present

## 2015-03-13 DIAGNOSIS — F419 Anxiety disorder, unspecified: Secondary | ICD-10-CM | POA: Diagnosis not present

## 2015-03-13 DIAGNOSIS — R109 Unspecified abdominal pain: Secondary | ICD-10-CM | POA: Diagnosis present

## 2015-03-13 DIAGNOSIS — K219 Gastro-esophageal reflux disease without esophagitis: Secondary | ICD-10-CM | POA: Diagnosis not present

## 2015-03-13 LAB — URINALYSIS, ROUTINE W REFLEX MICROSCOPIC
BILIRUBIN URINE: NEGATIVE
Glucose, UA: >1000 mg/dL — AB
Leukocytes, UA: NEGATIVE
NITRITE: NEGATIVE
PH: 6 (ref 5.0–8.0)
Protein, ur: NEGATIVE mg/dL
Specific Gravity, Urine: 1.023 (ref 1.005–1.030)
UROBILINOGEN UA: 0.2 mg/dL (ref 0.0–1.0)

## 2015-03-13 LAB — CBC
HCT: 34.3 % — ABNORMAL LOW (ref 36.0–46.0)
Hemoglobin: 11.7 g/dL — ABNORMAL LOW (ref 12.0–15.0)
MCH: 29.6 pg (ref 26.0–34.0)
MCHC: 34.1 g/dL (ref 30.0–36.0)
MCV: 86.8 fL (ref 78.0–100.0)
PLATELETS: 287 10*3/uL (ref 150–400)
RBC: 3.95 MIL/uL (ref 3.87–5.11)
RDW: 13.9 % (ref 11.5–15.5)
WBC: 6.5 10*3/uL (ref 4.0–10.5)

## 2015-03-13 LAB — COMPREHENSIVE METABOLIC PANEL
ALT: 16 U/L (ref 14–54)
AST: 26 U/L (ref 15–41)
Albumin: 3.7 g/dL (ref 3.5–5.0)
Alkaline Phosphatase: 79 U/L (ref 38–126)
Anion gap: 16 — ABNORMAL HIGH (ref 5–15)
BILIRUBIN TOTAL: 1.3 mg/dL — AB (ref 0.3–1.2)
BUN: 11 mg/dL (ref 6–20)
CO2: 17 mmol/L — ABNORMAL LOW (ref 22–32)
CREATININE: 0.98 mg/dL (ref 0.44–1.00)
Calcium: 8.7 mg/dL — ABNORMAL LOW (ref 8.9–10.3)
Chloride: 106 mmol/L (ref 101–111)
GFR calc Af Amer: >60 mL/min (ref >60)
Glucose, Bld: 243 mg/dL — ABNORMAL HIGH (ref 65–99)
Potassium: 3.7 mmol/L (ref 3.5–5.1)
Sodium: 139 mmol/L (ref 135–145)
TOTAL PROTEIN: 7.2 g/dL (ref 6.5–8.1)

## 2015-03-13 LAB — LIPASE, BLOOD: Lipase: 19 U/L — ABNORMAL LOW (ref 22–51)

## 2015-03-13 LAB — URINE MICROSCOPIC-ADD ON

## 2015-03-13 MED ORDER — HALOPERIDOL LACTATE 5 MG/ML IJ SOLN
5.0000 mg | Freq: Once | INTRAMUSCULAR | Status: AC
  Administered 2015-03-13: 5 mg via INTRAMUSCULAR
  Filled 2015-03-13: qty 1

## 2015-03-13 MED ORDER — ONDANSETRON 8 MG PO TBDP
8.0000 mg | ORAL_TABLET | Freq: Once | ORAL | Status: AC
  Administered 2015-03-13: 8 mg via ORAL
  Filled 2015-03-13: qty 1

## 2015-03-13 MED ORDER — HYDROMORPHONE HCL 1 MG/ML IJ SOLN
1.0000 mg | Freq: Once | INTRAMUSCULAR | Status: AC
  Administered 2015-03-13: 1 mg via INTRAMUSCULAR
  Filled 2015-03-13: qty 1

## 2015-03-13 MED ORDER — SODIUM CHLORIDE 0.9 % IV BOLUS (SEPSIS)
1000.0000 mL | Freq: Once | INTRAVENOUS | Status: AC
Start: 2015-03-13 — End: 2015-03-13
  Administered 2015-03-13: 1000 mL via INTRAVENOUS

## 2015-03-13 MED ORDER — HALOPERIDOL LACTATE 5 MG/ML IJ SOLN
2.0000 mg | Freq: Once | INTRAMUSCULAR | Status: AC
  Administered 2015-03-13: 2 mg via INTRAVENOUS
  Filled 2015-03-13: qty 1

## 2015-03-13 MED ORDER — DIPHENHYDRAMINE HCL 50 MG/ML IJ SOLN
25.0000 mg | Freq: Once | INTRAMUSCULAR | Status: AC
  Administered 2015-03-13: 25 mg via INTRAMUSCULAR
  Filled 2015-03-13: qty 1

## 2015-03-13 MED ORDER — METOCLOPRAMIDE HCL 5 MG/ML IJ SOLN: 10.0000 mg | Freq: Once | INTRAMUSCULAR | Status: DC

## 2015-03-13 MED ORDER — ONDANSETRON HCL 4 MG/2ML IJ SOLN
4.0000 mg | Freq: Once | INTRAMUSCULAR | Status: DC | PRN
  Filled 2015-03-13: qty 2

## 2015-03-13 NOTE — ED Notes (Signed)
Awake. Verbally responsive. Resp even and unlabored. No audible adventitious breath sounds noted. ABC's intact. Abd soft/nondistended and nontender to palpate. BS (+) and active x4 quadrants. No N/V/D reported.

## 2015-03-13 NOTE — ED Notes (Signed)
Awake. Verbally responsive. A/O x4. Resp even and unlabored. No audible adventitious breath sounds noted. ABC's intact. IV saline lock patent and intact. 

## 2015-03-13 NOTE — ED Provider Notes (Addendum)
CSN: [id number]     Arrival date & time 03/13/15  1432 History   First MD Initiated Contact with Patient 03/13/15 1533     Chief Complaint  Patient presents with  . Abdominal Pain     (Consider location/radiation/quality/duration/timing/severity/associated sxs/prior Treatment) HPI Comments: Issue with history of gastroparesis presents to the emergency department with complaints of nausea, vomiting with abdominal pain. Symptoms are similar to previous gastroparesis exacerbations.   Past Medical History  Diagnosis Date  . Hypertension   . Cataract   . Anxiety   . Anemia   . GERD (gastroesophageal reflux disease)   . Constipation 04/03/2012  . Complication of anesthesia   . PONV (postoperative nausea and vomiting)   . Asthma     IN CHILDHOOD  . Neuromuscular disorder     Diabetic neuropathy  . Gastroparesis   . Diabetic neuropathy   . History of blood product transfusion   . Diabetes mellitus     diagnosed age 21   Past Surgical History  Procedure Laterality Date  . Retinal detachment surgery    . Posterior vitrectomy and membrane peel-left eye  09/28/2002  . Posterior vitrectomy and membrane peel-right eye  03/16/2002  . Eye surgery    . Gailstones     Family History  Problem Relation Age of Onset  . Cystic fibrosis Mother   . Hypertension Father   . Diabetes Brother   . Hypertension Maternal Grandmother    Social History  Substance Use Topics  . Smoking status: Never Smoker   . Smokeless tobacco: Never Used  . Alcohol Use: No     Comment: none per patient August 2016   OB History    No data available     Review of Systems  Gastrointestinal: Positive for nausea, vomiting and abdominal pain.  All other systems reviewed and are negative.     Allergies  Anesthetics, amide; Penicillins; Buprenorphine hcl; Encainide; Metoclopramide; and Morphine and related  Home Medications   Prior to Admission medications   Medication Sig Start Date End Date Taking?  Authorizing Provider  acetaminophen (TYLENOL) 325 MG tablet Take 650 mg by mouth every 6 (six) hours as needed for moderate pain (cramping).    Yes Historical Provider, MD  dronabinol (MARINOL) 10 MG capsule Take 10 mg by mouth 2 (two) times daily. 02/04/15  Yes Historical Provider, MD  ferrous sulfate 325 (65 FE) MG tablet Take 325 mg by mouth daily.    Yes Historical Provider, MD  insulin aspart (NOVOLOG) 100 UNIT/ML injection Inject 0-15 Units into the skin 3 (three) times daily with meals. 6-15 units three times daily with food.  Per sliding scale 03/12/15  Yes Belkys A Regalado, MD  insulin detemir (LEVEMIR) 100 UNIT/ML injection Inject 0.15 mLs (15 Units total) into the skin at bedtime. 02/17/13  Yes Robbie Lis, MD  LORazepam (ATIVAN) 0.5 MG tablet Take 1 tablet by mouth 3 (three) times daily as needed for anxiety.  11/29/14  Yes Historical Provider, MD  pantoprazole (PROTONIX) 40 MG tablet Take 40 mg by mouth 2 (two) times daily. 01/04/15  Yes Historical Provider, MD  pregabalin (LYRICA) 150 MG capsule Take 1 capsule (150 mg total) by mouth 2 (two) times daily. 02/17/13  Yes Robbie Lis, MD  ciprofloxacin (CIPRO) 500 MG tablet Take 1 tablet (500 mg total) by mouth 2 (two) times daily. Patient not taking: Reported on 03/13/2015 03/12/15   Belkys A Regalado, MD  metroNIDAZOLE (FLAGYL) 500 MG tablet Take 1 tablet (  500 mg total) by mouth every 8 (eight) hours. Patient not taking: Reported on 03/13/2015 03/12/15   Belkys A Regalado, MD  ondansetron (ZOFRAN) 4 MG tablet Take 1 tablet (4 mg total) by mouth every 8 (eight) hours as needed for nausea or vomiting. Patient not taking: Reported on 03/13/2015 03/12/15   Belkys A Regalado, MD   BP 153/60 mmHg  Pulse 110  Resp 18  SpO2 100%  LMP 02/08/2015 Physical Exam  Constitutional: She is oriented to person, place, and time. She appears well-developed and well-nourished. No distress.  HENT:  Head: Normocephalic and atraumatic.  Right Ear: Hearing  normal.  Left Ear: Hearing normal.  Nose: Nose normal.  Mouth/Throat: Oropharynx is clear and moist and mucous membranes are normal.  Eyes: Conjunctivae and EOM are normal. Pupils are equal, round, and reactive to light.  Neck: Normal range of motion. Neck supple.  Cardiovascular: Regular rhythm, S1 normal and S2 normal.  Exam reveals no gallop and no friction rub.   No murmur heard. Pulmonary/Chest: Effort normal and breath sounds normal. No respiratory distress. She exhibits no tenderness.  Abdominal: Soft. Normal appearance and bowel sounds are normal. There is no hepatosplenomegaly. There is generalized tenderness. There is no rebound, no guarding, no tenderness at McBurney's point and negative Murphy's sign. No hernia.  Musculoskeletal: Normal range of motion.  Neurological: She is alert and oriented to person, place, and time. She has normal strength. No cranial nerve deficit or sensory deficit. Coordination normal. GCS eye subscore is 4. GCS verbal subscore is 5. GCS motor subscore is 6.  Skin: Skin is warm, dry and intact. No rash noted. No cyanosis.  Psychiatric: She has a normal mood and affect. Her speech is normal and behavior is normal. Thought content normal.  Nursing note and vitals reviewed.   ED Course  Procedures (including critical care time) Labs Review Labs Reviewed  LIPASE, BLOOD - Abnormal; Notable for the following:    Lipase 19 (*)    All other components within normal limits  COMPREHENSIVE METABOLIC PANEL - Abnormal; Notable for the following:    CO2 17 (*)    Glucose, Bld 243 (*)    Calcium 8.7 (*)    Total Bilirubin 1.3 (*)    Anion gap 16 (*)    All other components within normal limits  CBC - Abnormal; Notable for the following:    Hemoglobin 11.7 (*)    HCT 34.3 (*)    All other components within normal limits  URINALYSIS, ROUTINE W REFLEX MICROSCOPIC (NOT AT [facility]) - Abnormal; Notable for the following:    Glucose, UA >1000 (*)    Hgb urine dipstick  TRACE (*)    Ketones, ur >80 (*)    All other components within normal limits  URINE MICROSCOPIC-ADD ON    Imaging Review No results found. I have personally reviewed and evaluated these images and lab results as part of my medical decision-making.   EKG Interpretation None      MDM   Final diagnoses:  None   gastroparesis  Patient presented to the ER for evaluation of nausea, vomiting, abdominal pain. Patient was just discharged from the hospital yesterday for similar symptoms. Patient has a history of gastroparesis. Patient's symptoms are consistent with previous gastroparesis flares. Patient administered Dilaudid, Zofran, Haldol for her symptoms. She was given IV hydration. Urinalysis does not suggest infection. Hemoglobin is stable. Patient has mild elevation of glucose at 243. Bicarbonate is 17, but she has chronically low bicarbonate,  do not suspect ketoacidosis at this point. I do not feel that the patient requires repeat hospitalization at this point. Lab work is at baseline from discharge, patient has had chronic and recurrent symptoms similar to this.    Orpah Greek, MD 03/13/15 1956  Orpah Greek, MD 04/08/15 [phone]

## 2015-03-13 NOTE — ED Notes (Signed)
Family here to pick pt up  

## 2015-03-13 NOTE — ED Notes (Signed)
Pt arrived via EMS with report of intermittent generalized abd pain with n/v x6 in 24hrs without diarrhea. Pt reported having hx of gastropresis. Abd firm, nondistended and nontender to palpate. BS active x4 quadrants. LBM was 2 days ago. Pt active vomiting during assessment

## 2015-03-13 NOTE — ED Notes (Signed)
Bed: MZ04 Expected date:  Expected time:  Means of arrival:  Comments: Abd pain

## 2015-03-13 NOTE — ED Notes (Signed)
This nurse attempted IV venipuncture x1 unsuccessful. Dr. Waverly Ferrari, and Manuela Schwartz RN attempted IV via [place] and was unsuccessful.

## 2015-03-13 NOTE — ED Notes (Signed)
Awake. Verbally responsive. A/O x4. Resp even and unlabored. No audible adventitious breath sounds noted. ABC's intact. IV infusing NS at 999ml/hr without difficulty. 

## 2015-03-13 NOTE — Discharge Instructions (Signed)

## 2015-03-15 ENCOUNTER — Emergency Department (HOSPITAL_COMMUNITY)
Admission: EM | Admit: 2015-03-15 | Discharge: 2015-03-15 | Discharge status: Against Medical Advice | Payer: 59 | Attending: Emergency Medicine | Admitting: Emergency Medicine

## 2015-03-15 ENCOUNTER — Encounter (HOSPITAL_COMMUNITY): Payer: Self-pay

## 2015-03-15 DIAGNOSIS — E119 Type 2 diabetes mellitus without complications: Secondary | ICD-10-CM | POA: Diagnosis not present

## 2015-03-15 DIAGNOSIS — F419 Anxiety disorder, unspecified: Secondary | ICD-10-CM | POA: Diagnosis not present

## 2015-03-15 DIAGNOSIS — R109 Unspecified abdominal pain: Secondary | ICD-10-CM | POA: Insufficient documentation

## 2015-03-15 DIAGNOSIS — R110 Nausea: Secondary | ICD-10-CM | POA: Insufficient documentation

## 2015-03-15 DIAGNOSIS — I10 Essential (primary) hypertension: Secondary | ICD-10-CM | POA: Diagnosis not present

## 2015-03-15 DIAGNOSIS — J45909 Unspecified asthma, uncomplicated: Secondary | ICD-10-CM | POA: Diagnosis not present

## 2015-03-15 LAB — CBG MONITORING, ED: GLUCOSE-CAPILLARY: 556 mg/dL — AB (ref 65–99)

## 2015-03-15 LAB — CULTURE, BLOOD (ROUTINE X 2)
CULTURE: NO GROWTH
Culture: NO GROWTH

## 2015-03-15 NOTE — ED Notes (Addendum)
Pt c/o increasing anxiety x 2 days and abdominal pain and nausea x 2 days.  Pain score 6/10.  Pt reports that she has "kinda" been taking her medications.  Pt has been seen several times for same.

## 2015-03-15 NOTE — ED Notes (Signed)
Patient called from lobby no reponse

## 2015-03-15 NOTE — ED Notes (Signed)
Attempted to call Pt several times w/o response.  It is assumed the Pt went home.

## 2015-03-15 NOTE — ED Notes (Signed)
I attempted to collect labs and was unsuccessful. 

## 2015-03-15 NOTE — ED Notes (Signed)
Called form lobby no reponse

## 2015-03-21 ENCOUNTER — Emergency Department (HOSPITAL_COMMUNITY): Payer: 59

## 2015-03-21 ENCOUNTER — Encounter (HOSPITAL_COMMUNITY): Payer: Self-pay | Admitting: Emergency Medicine

## 2015-03-21 ENCOUNTER — Emergency Department (HOSPITAL_COMMUNITY)
Admission: EM | Admit: 2015-03-21 | Discharge: 2015-03-21 | Disposition: A | Discharge status: Home / Self Care | Payer: 59 | Attending: Emergency Medicine | Admitting: Emergency Medicine

## 2015-03-21 DIAGNOSIS — Z3202 Encounter for pregnancy test, result negative: Secondary | ICD-10-CM | POA: Diagnosis not present

## 2015-03-21 DIAGNOSIS — F419 Anxiety disorder, unspecified: Secondary | ICD-10-CM | POA: Diagnosis not present

## 2015-03-21 DIAGNOSIS — R109 Unspecified abdominal pain: Secondary | ICD-10-CM

## 2015-03-21 DIAGNOSIS — Z880 Allergy status to penicillin: Secondary | ICD-10-CM | POA: Diagnosis not present

## 2015-03-21 DIAGNOSIS — K219 Gastro-esophageal reflux disease without esophagitis: Secondary | ICD-10-CM | POA: Diagnosis not present

## 2015-03-21 DIAGNOSIS — J45909 Unspecified asthma, uncomplicated: Secondary | ICD-10-CM | POA: Insufficient documentation

## 2015-03-21 DIAGNOSIS — I10 Essential (primary) hypertension: Secondary | ICD-10-CM | POA: Insufficient documentation

## 2015-03-21 DIAGNOSIS — F1410 Cocaine abuse, uncomplicated: Secondary | ICD-10-CM | POA: Insufficient documentation

## 2015-03-21 DIAGNOSIS — D649 Anemia, unspecified: Secondary | ICD-10-CM | POA: Diagnosis not present

## 2015-03-21 DIAGNOSIS — E1140 Type 2 diabetes mellitus with diabetic neuropathy, unspecified: Secondary | ICD-10-CM | POA: Diagnosis not present

## 2015-03-21 DIAGNOSIS — K3184 Gastroparesis: Secondary | ICD-10-CM | POA: Insufficient documentation

## 2015-03-21 DIAGNOSIS — Z794 Long term (current) use of insulin: Secondary | ICD-10-CM | POA: Diagnosis not present

## 2015-03-21 DIAGNOSIS — Z79899 Other long term (current) drug therapy: Secondary | ICD-10-CM | POA: Diagnosis not present

## 2015-03-21 DIAGNOSIS — Z9849 Cataract extraction status, unspecified eye: Secondary | ICD-10-CM | POA: Insufficient documentation

## 2015-03-21 LAB — CBC
HEMATOCRIT: 31 % — AB (ref 36.0–46.0)
Hemoglobin: 10.4 g/dL — ABNORMAL LOW (ref 12.0–15.0)
MCH: 29.9 pg (ref 26.0–34.0)
MCHC: 33.5 g/dL (ref 30.0–36.0)
MCV: 89.1 fL (ref 78.0–100.0)
PLATELETS: 291 10*3/uL (ref 150–400)
RBC: 3.48 MIL/uL — ABNORMAL LOW (ref 3.87–5.11)
RDW: 14.8 % (ref 11.5–15.5)
WBC: 4.5 10*3/uL (ref 4.0–10.5)

## 2015-03-21 LAB — URINALYSIS, ROUTINE W REFLEX MICROSCOPIC
BILIRUBIN URINE: NEGATIVE
GLUCOSE, UA: 500 mg/dL — AB
HGB URINE DIPSTICK: NEGATIVE
Ketones, ur: 40 mg/dL — AB
Leukocytes, UA: NEGATIVE
Nitrite: NEGATIVE
Protein, ur: NEGATIVE mg/dL
SPECIFIC GRAVITY, URINE: 1.012 (ref 1.005–1.030)
Urobilinogen, UA: 0.2 mg/dL (ref 0.0–1.0)
pH: 7.5 (ref 5.0–8.0)

## 2015-03-21 LAB — BASIC METABOLIC PANEL
Anion gap: 9 (ref 5–15)
BUN: 5 mg/dL — AB (ref 6–20)
CO2: 25 mmol/L (ref 22–32)
CREATININE: 0.72 mg/dL (ref 0.44–1.00)
Calcium: 8.3 mg/dL — ABNORMAL LOW (ref 8.9–10.3)
Chloride: 102 mmol/L (ref 101–111)
GFR calc Af Amer: >60 mL/min (ref >60)
GFR calc non Af Amer: >60 mL/min (ref >60)
GLUCOSE: 223 mg/dL — AB (ref 65–99)
POTASSIUM: 3.6 mmol/L (ref 3.5–5.1)
Sodium: 136 mmol/L (ref 135–145)

## 2015-03-21 LAB — RAPID URINE DRUG SCREEN, HOSP PERFORMED
AMPHETAMINES: NOT DETECTED
BARBITURATES: NOT DETECTED
BENZODIAZEPINES: NOT DETECTED
COCAINE: NOT DETECTED
Opiates: NOT DETECTED
TETRAHYDROCANNABINOL: POSITIVE — AB

## 2015-03-21 LAB — CBG MONITORING, ED: Glucose-Capillary: 204 mg/dL — ABNORMAL HIGH (ref 65–99)

## 2015-03-21 LAB — POC URINE PREG, ED: Preg Test, Ur: NEGATIVE

## 2015-03-21 MED ORDER — HALOPERIDOL LACTATE 5 MG/ML IJ SOLN: 2.0000 mg | Freq: Once | INTRAMUSCULAR | Status: DC

## 2015-03-21 MED ORDER — HALOPERIDOL LACTATE 5 MG/ML IJ SOLN
2.0000 mg | Freq: Once | INTRAMUSCULAR | Status: AC
  Administered 2015-03-21: 2 mg via INTRAMUSCULAR
  Filled 2015-03-21: qty 1

## 2015-03-21 MED ORDER — KETOROLAC TROMETHAMINE 30 MG/ML IJ SOLN
30.0000 mg | Freq: Once | INTRAMUSCULAR | Status: AC
  Administered 2015-03-21: 30 mg via INTRAVENOUS
  Filled 2015-03-21: qty 1

## 2015-03-21 MED ORDER — SODIUM CHLORIDE 0.9 % IV BOLUS (SEPSIS): 1000.0000 mL | Freq: Once | INTRAVENOUS | Status: DC

## 2015-03-21 MED ORDER — ONDANSETRON 4 MG PO TBDP
4.0000 mg | ORAL_TABLET | Freq: Three times a day (TID) | ORAL | Status: DC | PRN
Start: 2015-03-21 — End: 2015-04-09

## 2015-03-21 MED ORDER — SODIUM CHLORIDE 0.9 % IV BOLUS (SEPSIS)
2000.0000 mL | Freq: Once | INTRAVENOUS | Status: AC
  Administered 2015-03-21: 2000 mL via INTRAVENOUS

## 2015-03-21 MED ORDER — ONDANSETRON HCL 4 MG/2ML IJ SOLN
4.0000 mg | Freq: Once | INTRAMUSCULAR | Status: AC
  Administered 2015-03-21: 4 mg via INTRAVENOUS
  Filled 2015-03-21: qty 2

## 2015-03-21 MED ORDER — HALOPERIDOL LACTATE 5 MG/ML IJ SOLN: 5.0000 mg | Freq: Once | INTRAMUSCULAR | Status: DC

## 2015-03-21 MED ORDER — DICYCLOMINE HCL 10 MG/ML IM SOLN
20.0000 mg | Freq: Once | INTRAMUSCULAR | Status: AC
  Administered 2015-03-21: 20 mg via INTRAMUSCULAR
  Filled 2015-03-21: qty 2

## 2015-03-21 NOTE — ED Provider Notes (Signed)
7:00am - Hand-off from [employer], PA-C.   Pt is a 49 yo female with PMH of HTN, anxiety, reflux, gastroparesis and DM who presents to the ED with complaint of abdominal pain. Pt was discharged from [facility] 3 days ago for DKA with encephalopathy. Pt reports sharp pain in her lower abdomen. Endorses NBNB vomiting. Reports this episode is consistent with prior episodes of gastroparesis. No relief with Marinol at home. Denies fever, blood in stool, urinary sxs.   VSS. Abdomainal exam reveals diffuse TTP, BS present, no peritoneal signs. Glucose 223, no anion gap. Labs unremarkable. Abdominal xray shows relative paucity of gas, no obstruction.   Pt has been given IM Bentyl and Haldol due to unable to get IV access. [place] IV access established. IVF started. Pt given IV toradol and zofran. Pt reports nausea and abdominal pain have improved. Pt able to tolerate PO. Plan to d/c pt home with zofran. Advised pt to follow up with PCP.    [address] [employer], [place] 03/21/15 [id number]  Rolland Porter, MD 03/23/15 2306

## 2015-03-21 NOTE — ED Notes (Addendum)
Pt presents from home via EMS c/o weakness, n/v, dehydration x 2 days. 10/10 upper abdominal pain that she describes as being related to her severe gastroparesis.  6 episodes of vomiting today per pt. No vomiting en route per EMS personnel.  Pt is in scrubs from last discharge from the hospital.  Vitals en route: 169/84, 98HR, 18RR, 99% room air, CBG 172. No line and no meds en route. Hx of gastroparesis, Type I DM.  She states that she is anxious and wants to be medicated for her anxiety.

## 2015-03-21 NOTE — ED Notes (Signed)
Pt is with IV team at this time attempting to achieve venous access.  Fluids will be administered when able.

## 2015-03-21 NOTE — ED Provider Notes (Signed)
CSN: [id number]     Arrival date & time 03/21/15  0247 History   First MD Initiated Contact with Patient 03/21/15 [phone]     Chief Complaint  Patient presents with  . Weakness  . Abdominal Pain     (Consider location/radiation/quality/duration/timing/severity/associated sxs/prior Treatment) HPI Comments: 49 year old female with a history of hypertension, anxiety, esophageal reflux, gastroparesis, and diabetes mellitus presents to the emergency department for evaluation of abdominal pain. Patient states that she has been having abdominal pain over the last day. She reports 6 episodes of emesis with her symptoms the last of which was 2 hours ago. She denies any blood in her emesis. She complains of persistent nausea. She states that the pain is present in her lower abdomen and is sharp. She describes the pain as consistent with her past episodes of gastroparesis. She states that she is followed by Dr. Landry Mellow of gastroenterology at [facility]. Patient took Marinol for symptoms without relief. She is also complaining of dehydration and anxiety. She is requesting Ativan. Patient does report compliance with her insulin today. She has not had any fevers. Last bowel movement was yesterday. No history of melanoma or hematochezia. No urinary symptoms.   Patient recently hospitalized at [facility] 03/15/15-03/18/15 for DKA with acute encephalopathy.  Patient is a 49 y.o. female presenting with weakness and abdominal pain. The history is provided by the patient. No language interpreter was used.  Weakness Associated symptoms include abdominal pain and weakness.  Abdominal Pain   Past Medical History  Diagnosis Date  . Hypertension   . Cataract   . Anxiety   . Anemia   . GERD (gastroesophageal reflux disease)   . Constipation 04/03/2012  . Complication of anesthesia   . PONV (postoperative nausea and vomiting)   . Asthma     IN CHILDHOOD  . Neuromuscular disorder     Diabetic neuropathy  . Gastroparesis   .  Diabetic neuropathy   . History of blood product transfusion   . Diabetes mellitus     diagnosed age 79   Past Surgical History  Procedure Laterality Date  . Retinal detachment surgery    . Posterior vitrectomy and membrane peel-left eye  09/28/2002  . Posterior vitrectomy and membrane peel-right eye  03/16/2002  . Eye surgery    . Gailstones     Family History  Problem Relation Age of Onset  . Cystic fibrosis Mother   . Hypertension Father   . Diabetes Brother   . Hypertension Maternal Grandmother    Social History  Substance Use Topics  . Smoking status: Never Smoker   . Smokeless tobacco: Never Used  . Alcohol Use: No     Comment: none per patient August 2016   OB History    No data available     Review of Systems  Gastrointestinal: Positive for abdominal pain.  Neurological: Positive for weakness.      Allergies  Anesthetics, amide; Penicillins; Buprenorphine hcl; Encainide; Metoclopramide; and Morphine and related  Home Medications   Prior to Admission medications   Medication Sig Start Date End Date Taking? Authorizing Viola Kinnick  acetaminophen (TYLENOL) 325 MG tablet Take 650 mg by mouth every 6 (six) hours as needed for moderate pain (cramping).    Yes Historical Salah Nakamura, MD  dronabinol (MARINOL) 10 MG capsule Take 10 mg by mouth 2 (two) times daily. 02/04/15  Yes Historical Elberta Lachapelle, MD  ferrous sulfate 325 (65 FE) MG tablet Take 325 mg by mouth daily.    Yes Historical Kaisa Wofford,  MD  insulin aspart (NOVOLOG) 100 UNIT/ML injection Inject 0-15 Units into the skin 3 (three) times daily with meals. 6-15 units three times daily with food.  Per sliding scale 03/12/15  Yes Belkys A Regalado, MD  insulin detemir (LEVEMIR) 100 UNIT/ML injection Inject 0.15 mLs (15 Units total) into the skin at bedtime. 02/17/13  Yes Robbie Lis, MD  LORazepam (ATIVAN) 0.5 MG tablet Take 1 tablet by mouth 3 (three) times daily as needed for anxiety.  11/29/14  Yes Historical Jaely Silman, MD   pantoprazole (PROTONIX) 40 MG tablet Take 40 mg by mouth 2 (two) times daily. 01/04/15  Yes Historical Belem Hintze, MD  pregabalin (LYRICA) 150 MG capsule Take 1 capsule (150 mg total) by mouth 2 (two) times daily. 02/17/13  Yes Robbie Lis, MD  ciprofloxacin (CIPRO) 500 MG tablet Take 1 tablet (500 mg total) by mouth 2 (two) times daily. Patient not taking: Reported on 03/13/2015 03/12/15   Belkys A Regalado, MD  metroNIDAZOLE (FLAGYL) 500 MG tablet Take 1 tablet (500 mg total) by mouth every 8 (eight) hours. Patient not taking: Reported on 03/13/2015 03/12/15   Belkys A Regalado, MD  ondansetron (ZOFRAN) 4 MG tablet Take 1 tablet (4 mg total) by mouth every 8 (eight) hours as needed for nausea or vomiting. Patient not taking: Reported on 03/13/2015 03/12/15   Belkys A Regalado, MD   BP 157/80 mmHg  Pulse 96  Temp(Src) 97.4 F (36.3 C) (Oral)  Resp 20  Ht 5\' 2"  (1.575 m)  Wt 141 lb (63.957 kg)  BMI 25.78 kg/m2  SpO2 97%  LMP 03/11/2015   Physical Exam  Constitutional: She is oriented to person, place, and time. She appears well-developed and well-nourished. No distress.  Nontoxic/nonseptic appearing. Patient still in hospital scrubs.  HENT:  Head: Normocephalic and atraumatic.  Eyes: Conjunctivae and EOM are normal. No scleral icterus.  Neck: Normal range of motion.  Cardiovascular: Normal rate, regular rhythm and intact distal pulses.   Pulmonary/Chest: Effort normal and breath sounds normal. No respiratory distress. She has no wheezes. She has no rales.  Respirations even and unlabored. Lungs CTAB.  Abdominal: Soft. She exhibits no distension. There is tenderness. There is no rebound and no guarding.  Diffuse TTP with voluntary guarding. No masses or peritoneal signs.  Musculoskeletal: Normal range of motion.  Neurological: She is alert and oriented to person, place, and time. She exhibits normal muscle tone. Coordination normal.  Skin: Skin is warm and dry. No rash noted. She is not  diaphoretic. No erythema. No pallor.  Psychiatric: She has a normal mood and affect. Her behavior is normal.  Nursing note and vitals reviewed.   ED Course  Procedures (including critical care time) Labs Review Labs Reviewed  BASIC METABOLIC PANEL - Abnormal; Notable for the following:    Glucose, Bld 223 (*)    Calcium 8.3 (*)    All other components within normal limits  CBC - Abnormal; Notable for the following:    RBC 3.48 (*)    Hemoglobin 10.4 (*)    HCT 31.0 (*)    All other components within normal limits  CBG MONITORING, ED - Abnormal; Notable for the following:    Glucose-Capillary 204 (*)    All other components within normal limits  URINALYSIS, ROUTINE W REFLEX MICROSCOPIC (NOT AT [facility])  URINE RAPID DRUG SCREEN, HOSP PERFORMED  POC URINE PREG, ED    Imaging Review No results found.   I have personally reviewed and evaluated these images  and lab results as part of my medical decision-making.   EKG Interpretation   Date/Time:  Monday March 21 2015 03:01:50 EDT Ventricular Rate:  92 PR Interval:  123 QRS Duration: 96 QT Interval:  369 QTC Calculation: 456 R Axis:   29 Text Interpretation:  Sinus rhythm Borderline T abnormalities, lateral  leads Baseline wander in lead(s) V5 No significant change since last  tracing 20 Feb 2015 Confirmed by [facility]  MD-I, IVA ([id number]) on 03/21/2015  3:35:34 AM       Medications  sodium chloride 0.9 % bolus 2,000 mL (not administered)  dicyclomine (BENTYL) injection 20 mg (20 mg Intramuscular Given 03/21/15 0513)  haloperidol lactate (HALDOL) injection 2 mg (2 mg Intramuscular Given 03/21/15 0512)    MDM   Final diagnoses:  Abdominal pain  Gastroparesis    49 year old female presents to the emergency department for complaints of abdominal pain which she states is consistent with her gastroparesis. Patient has had no vomiting while in the emergency department. She was recently discharged on 03/18/2015 from [facility] after  admission for DKA with acute encephalopathy. Glucose today is 223. There is no anion gap acidosis. Labs c/w prior work ups. DG abdomen pending to evaluate for free air or obstruction; suspect that this will be negative. UA and pregnancy negative.  IV team consult placed as unable to gain IV access for IVF hydration. Patient given initial dose of Bentyl and Haldol for presumed gastroparesis-type pain. Patient to be signed out to oncoming ED Mikita Lesmeister, Harlene Ramus, PA-C at shift change. Would recommend avoiding all narcotics in this patient given her hx of gastroparesis and, instead, attempt additional symptoms control with an antiemetic and toradol for persistent pain if needed. Anticipate discharge after symptoms better controlled.   Filed Vitals:   03/21/15 0252 03/21/15 0256 03/21/15 0300  BP: 157/80  162/74  Pulse: 96  93  Temp: 97.4 F (36.3 C)    TempSrc: Oral    Resp: 20  7  Height:  5\' 2"  (1.575 m)   Weight:  141 lb (63.957 kg)   SpO2: 97%  96%     Antonietta Breach, PA-C 03/21/15 3007  Rolland Porter, MD 03/21/15 [phone]

## 2015-03-21 NOTE — ED Notes (Signed)
Pt drinking ginger ale  

## 2015-03-21 NOTE — Discharge Instructions (Signed)
Please take your prescription for Zofran as prescribed for nausea. Please follow up with your primary care provider in 3-4 days.  Please return to the Emergency Department if symptoms worsen or new onset of fever, blood in emesis, blood in stool.

## 2015-03-21 NOTE — ED Notes (Signed)
IV team unable to access IV. Ultrasound IV to be attempted.

## 2015-03-30 ENCOUNTER — Encounter (HOSPITAL_COMMUNITY): Payer: Self-pay

## 2015-03-30 ENCOUNTER — Emergency Department (HOSPITAL_COMMUNITY)
Admission: EM | Admit: 2015-03-30 | Discharge: 2015-03-30 | Disposition: A | Discharge status: Home / Self Care | Payer: 59 | Attending: Emergency Medicine | Admitting: Emergency Medicine

## 2015-03-30 DIAGNOSIS — K219 Gastro-esophageal reflux disease without esophagitis: Secondary | ICD-10-CM | POA: Insufficient documentation

## 2015-03-30 DIAGNOSIS — Z79899 Other long term (current) drug therapy: Secondary | ICD-10-CM | POA: Insufficient documentation

## 2015-03-30 DIAGNOSIS — J45909 Unspecified asthma, uncomplicated: Secondary | ICD-10-CM | POA: Insufficient documentation

## 2015-03-30 DIAGNOSIS — Z794 Long term (current) use of insulin: Secondary | ICD-10-CM | POA: Insufficient documentation

## 2015-03-30 DIAGNOSIS — I10 Essential (primary) hypertension: Secondary | ICD-10-CM | POA: Insufficient documentation

## 2015-03-30 DIAGNOSIS — R112 Nausea with vomiting, unspecified: Secondary | ICD-10-CM

## 2015-03-30 DIAGNOSIS — Z3202 Encounter for pregnancy test, result negative: Secondary | ICD-10-CM | POA: Insufficient documentation

## 2015-03-30 DIAGNOSIS — K5900 Constipation, unspecified: Secondary | ICD-10-CM | POA: Insufficient documentation

## 2015-03-30 DIAGNOSIS — K3184 Gastroparesis: Secondary | ICD-10-CM

## 2015-03-30 DIAGNOSIS — E119 Type 2 diabetes mellitus without complications: Secondary | ICD-10-CM | POA: Insufficient documentation

## 2015-03-30 DIAGNOSIS — F419 Anxiety disorder, unspecified: Secondary | ICD-10-CM | POA: Insufficient documentation

## 2015-03-30 DIAGNOSIS — Z862 Personal history of diseases of the blood and blood-forming organs and certain disorders involving the immune mechanism: Secondary | ICD-10-CM | POA: Insufficient documentation

## 2015-03-30 DIAGNOSIS — Z880 Allergy status to penicillin: Secondary | ICD-10-CM | POA: Insufficient documentation

## 2015-03-30 DIAGNOSIS — R1084 Generalized abdominal pain: Secondary | ICD-10-CM

## 2015-03-30 LAB — LIPASE, BLOOD: Lipase: 44 U/L (ref 22–51)

## 2015-03-30 LAB — URINALYSIS, ROUTINE W REFLEX MICROSCOPIC
BILIRUBIN URINE: NEGATIVE
Glucose, UA: NEGATIVE mg/dL
HGB URINE DIPSTICK: NEGATIVE
KETONES UR: NEGATIVE mg/dL
Nitrite: NEGATIVE
PH: 7.5 (ref 5.0–8.0)
Protein, ur: 30 mg/dL — AB
SPECIFIC GRAVITY, URINE: 1.015 (ref 1.005–1.030)
UROBILINOGEN UA: 1 mg/dL (ref 0.0–1.0)

## 2015-03-30 LAB — CBC
HEMATOCRIT: 37.3 % (ref 36.0–46.0)
Hemoglobin: 12.3 g/dL (ref 12.0–15.0)
MCH: 29.7 pg (ref 26.0–34.0)
MCHC: 33 g/dL (ref 30.0–36.0)
MCV: 90.1 fL (ref 78.0–100.0)
Platelets: 429 10*3/uL — ABNORMAL HIGH (ref 150–400)
RBC: 4.14 MIL/uL (ref 3.87–5.11)
RDW: 14.9 % (ref 11.5–15.5)
WBC: 6.3 10*3/uL (ref 4.0–10.5)

## 2015-03-30 LAB — COMPREHENSIVE METABOLIC PANEL
ALT: 23 U/L (ref 14–54)
ANION GAP: 11 (ref 5–15)
AST: 34 U/L (ref 15–41)
Albumin: 3.8 g/dL (ref 3.5–5.0)
Alkaline Phosphatase: 84 U/L (ref 38–126)
BILIRUBIN TOTAL: 1.3 mg/dL — AB (ref 0.3–1.2)
BUN: 10 mg/dL (ref 6–20)
CO2: 27 mmol/L (ref 22–32)
Calcium: 9.5 mg/dL (ref 8.9–10.3)
Chloride: 99 mmol/L — ABNORMAL LOW (ref 101–111)
Creatinine, Ser: 0.97 mg/dL (ref 0.44–1.00)
Glucose, Bld: 140 mg/dL — ABNORMAL HIGH (ref 65–99)
POTASSIUM: 4.3 mmol/L (ref 3.5–5.1)
Sodium: 137 mmol/L (ref 135–145)
TOTAL PROTEIN: 7.9 g/dL (ref 6.5–8.1)

## 2015-03-30 LAB — I-STAT BETA HCG BLOOD, ED (MC, WL, AP ONLY)

## 2015-03-30 LAB — CBG MONITORING, ED
GLUCOSE-CAPILLARY: 109 mg/dL — AB (ref 65–99)
Glucose-Capillary: 131 mg/dL — ABNORMAL HIGH (ref 65–99)

## 2015-03-30 LAB — URINE MICROSCOPIC-ADD ON

## 2015-03-30 MED ORDER — SODIUM CHLORIDE 0.9 % IV BOLUS (SEPSIS)
1000.0000 mL | Freq: Once | INTRAVENOUS | Status: AC
  Administered 2015-03-30: 1000 mL via INTRAVENOUS

## 2015-03-30 MED ORDER — PROMETHAZINE HCL 25 MG/ML IJ SOLN: 12.5000 mg | Freq: Once | INTRAMUSCULAR | Status: DC

## 2015-03-30 MED ORDER — LORAZEPAM 2 MG/ML IJ SOLN
1.0000 mg | Freq: Once | INTRAMUSCULAR | Status: AC
  Administered 2015-03-30: 1 mg via INTRAVENOUS
  Filled 2015-03-30: qty 1

## 2015-03-30 MED ORDER — HALOPERIDOL LACTATE 5 MG/ML IJ SOLN
5.0000 mg | Freq: Once | INTRAMUSCULAR | Status: AC
  Administered 2015-03-30: 5 mg via INTRAVENOUS
  Filled 2015-03-30: qty 1

## 2015-03-30 MED ORDER — ONDANSETRON HCL 4 MG/2ML IJ SOLN
4.0000 mg | Freq: Once | INTRAMUSCULAR | Status: AC
Start: 2015-03-30 — End: 2015-03-30
  Administered 2015-03-30: 4 mg via INTRAVENOUS
  Filled 2015-03-30: qty 2

## 2015-03-30 NOTE — Discharge Instructions (Signed)
Gastroparesis °Gastroparesis, also called delayed gastric emptying, is a condition in which food takes longer than normal to empty from the stomach. The condition is usually long-lasting (chronic). °CAUSES °This condition may be caused by: °· An endocrine disorder, such as hypothyroidism or diabetes. Diabetes is the most common cause of this condition. °· A nervous system disease, such as Parkinson disease or multiple sclerosis. °· Cancer, infection, or surgery of the stomach or vagus nerve. °· A connective tissue disorder, such as scleroderma. °· Certain medicines. °In most cases, the cause is not known. °RISK FACTORS °This condition is more likely to develop in: °· People with certain disorders, including endocrine disorders, eating disorders, amyloidosis, and scleroderma. °· People with certain diseases, including Parkinson disease or multiple sclerosis. °· People with cancer or infection of the stomach or vagus nerve. °· People who have had surgery on the stomach or vagus nerve. °· People who take certain medicines. °· Women. °SYMPTOMS °Symptoms of this condition include: °· An early feeling of fullness when eating. °· Nausea. °· Weight loss. °· Vomiting. °· Heartburn. °· Abdominal bloating. °· Inconsistent blood glucose levels. °· Lack of appetite. °· Acid from the stomach coming up into the esophagus (gastroesophageal reflux). °· Spasms of the stomach. °Symptoms may come and go. °DIAGNOSIS °This condition is diagnosed with tests, such as: °· Tests that check how long it takes food to move through the stomach and intestines. These tests include: °· Upper gastrointestinal (GI) series. In this test, X-rays of the intestines are taken after you drink a liquid. The liquid makes the intestines show up better on the X-rays. °· Gastric emptying scintigraphy. In this test, scans are taken after you eat food that contains a small amount of radioactive material. °· Wireless capsule GI monitoring system. This test  involves swallowing a capsule that records information about movement through the stomach. °· Gastric manometry. This test measures electrical and muscular activity in the stomach. It is done with a thin tube that is passed down the throat and into the stomach. °· Endoscopy. This test checks for abnormalities in the lining of the stomach. It is done with a long, thin tube that is passed down the throat and into the stomach. °· An ultrasound. This test can help rule out gallbladder disease or pancreatitis as a cause of your symptoms. It uses sound waves to take pictures of the inside of your body. °TREATMENT °There is no cure for gastroparesis. This condition may be managed with: °· Treatment of the underlying condition causing the gastroparesis. °· Lifestyle changes, including exercise and dietary changes. Dietary changes can include: °· Changes in what and when you eat. °· Eating smaller meals more often. °· Eating low-fat foods. °· Eating low-fiber forms of high-fiber foods, such as cooked vegetables instead of raw vegetables. °· Having liquid foods in place of solid foods. Liquid foods are easier to digest. °· Medicines. These may be given to control nausea and vomiting and to stimulate stomach muscles. °· Getting food through a feeding tube. This may be done in severe cases. °· A gastric neurostimulator. This is a device that is inserted into the body with surgery. It helps improve stomach emptying and control nausea and vomiting. °HOME CARE INSTRUCTIONS °· Follow your health care provider's instructions about exercise and diet. °· Take medicines only as directed by your health care provider. °SEEK MEDICAL CARE IF: °· Your symptoms do not improve with treatment. °· You have new symptoms. °SEEK IMMEDIATE MEDICAL CARE IF: °· You have   severe abdominal pain that does not improve with treatment.  You have nausea that does not go away.  You cannot keep fluids down.   This information is not intended to replace  advice given to you by your health care provider. Make sure you discuss any questions you have with your health care provider.   Document Released: 06/11/2005 Document Revised: 10/26/2014 Document Reviewed: 06/07/2014 Elsevier Interactive Patient Education 2016 Elsevier Inc.  Abdominal Pain, Adult Many things can cause abdominal pain. Usually, abdominal pain is not caused by a disease and will improve without treatment. It can often be observed and treated at home. Your health care provider will do a physical exam and possibly order blood tests and X-rays to help determine the seriousness of your pain. However, in many cases, more time must pass before a clear cause of the pain can be found. Before that point, your health care provider may not know if you need more testing or further treatment. HOME CARE INSTRUCTIONS Monitor your abdominal pain for any changes. The following actions may help to alleviate any discomfort you are experiencing:  Only take over-the-counter or prescription medicines as directed by your health care provider.  Do not take laxatives unless directed to do so by your health care provider.  Try a clear liquid diet (broth, tea, or water) as directed by your health care provider. Slowly move to a bland diet as tolerated. SEEK MEDICAL CARE IF:  You have unexplained abdominal pain.  You have abdominal pain associated with nausea or diarrhea.  You have pain when you urinate or have a bowel movement.  You experience abdominal pain that wakes you in the night.  You have abdominal pain that is worsened or improved by eating food.  You have abdominal pain that is worsened with eating fatty foods.  You have a fever. SEEK IMMEDIATE MEDICAL CARE IF:  Your pain does not go away within 2 hours.  You keep throwing up (vomiting).  Your pain is felt only in portions of the abdomen, such as the right side or the left lower portion of the abdomen.  You pass bloody or black  tarry stools. MAKE SURE YOU:  Understand these instructions.  Will watch your condition.  Will get help right away if you are not doing well or get worse.   This information is not intended to replace advice given to you by your health care provider. Make sure you discuss any questions you have with your health care provider.   Document Released: 03/21/2005 Document Revised: 03/02/2015 Document Reviewed: 02/18/2013 Elsevier Interactive Patient Education 2016 Elsevier Inc. Nausea and Vomiting Nausea is a sick feeling that often comes before throwing up (vomiting). Vomiting is a reflex where stomach contents come out of your mouth. Vomiting can cause severe loss of body fluids (dehydration). Children and elderly adults can become dehydrated quickly, especially if they also have diarrhea. Nausea and vomiting are symptoms of a condition or disease. It is important to find the cause of your symptoms. CAUSES   Direct irritation of the stomach lining. This irritation can result from increased acid production (gastroesophageal reflux disease), infection, food poisoning, taking certain medicines (such as nonsteroidal anti-inflammatory drugs), alcohol use, or tobacco use.  Signals from the brain.These signals could be caused by a headache, heat exposure, an inner ear disturbance, increased pressure in the brain from injury, infection, a tumor, or a concussion, pain, emotional stimulus, or metabolic problems.  An obstruction in the gastrointestinal tract (bowel obstruction).  Illnesses  such as diabetes, hepatitis, gallbladder problems, appendicitis, kidney problems, cancer, sepsis, atypical symptoms of a heart attack, or eating disorders.  Medical treatments such as chemotherapy and radiation.  Receiving medicine that makes you sleep (general anesthetic) during surgery. DIAGNOSIS Your caregiver may ask for tests to be done if the problems do not improve after a few days. Tests may also be done if  symptoms are severe or if the reason for the nausea and vomiting is not clear. Tests may include:  Urine tests.  Blood tests.  Stool tests.  Cultures (to look for evidence of infection).  X-rays or other imaging studies. Test results can help your caregiver make decisions about treatment or the need for additional tests. TREATMENT You need to stay well hydrated. Drink frequently but in small amounts.You may wish to drink water, sports drinks, clear broth, or eat frozen ice pops or gelatin dessert to help stay hydrated.When you eat, eating slowly may help prevent nausea.There are also some antinausea medicines that may help prevent nausea. HOME CARE INSTRUCTIONS   Take all medicine as directed by your caregiver.  If you do not have an appetite, do not force yourself to eat. However, you must continue to drink fluids.  If you have an appetite, eat a normal diet unless your caregiver tells you differently.  Eat a variety of complex carbohydrates (rice, wheat, potatoes, bread), lean meats, yogurt, fruits, and vegetables.  Avoid high-fat foods because they are more difficult to digest.  Drink enough water and fluids to keep your urine clear or pale yellow.  If you are dehydrated, ask your caregiver for specific rehydration instructions. Signs of dehydration may include:  Severe thirst.  Dry lips and mouth.  Dizziness.  Dark urine.  Decreasing urine frequency and amount.  Confusion.  Rapid breathing or pulse. SEEK IMMEDIATE MEDICAL CARE IF:   You have blood or brown flecks (like coffee grounds) in your vomit.  You have black or bloody stools.  You have a severe headache or stiff neck.  You are confused.  You have severe abdominal pain.  You have chest pain or trouble breathing.  You do not urinate at least once every 8 hours.  You develop cold or clammy skin.  You continue to vomit for longer than 24 to 48 hours.  You have a fever. MAKE SURE YOU:    Understand these instructions.  Will watch your condition.  Will get help right away if you are not doing well or get worse.   This information is not intended to replace advice given to you by your health care provider. Make sure you discuss any questions you have with your health care provider.   Document Released: 06/11/2005 Document Revised: 09/03/2011 Document Reviewed: 11/08/2010 Elsevier Interactive Patient Education [employer].

## 2015-03-30 NOTE — ED Notes (Signed)
Unable to obtain IV access, another RN will attempt [place] IV access.

## 2015-03-30 NOTE — ED Notes (Signed)
Pt c/o generalized abdominal pain and n/v starting this morning and constipation x 6 days.  Pain score 7/10.  Hx of DM and gastroparesis.  Pt reports taking all medications as prescribe and nothing alleviates symptoms.  Sts she has been taking stool softeners w/o relief.

## 2015-03-30 NOTE — ED Provider Notes (Signed)
CSN: [id number]     Arrival date & time 03/30/15  1447 History   First MD Initiated Contact with Patient 03/30/15 1510     Chief Complaint  Patient presents with  . Abdominal Pain  . Emesis  . Constipation   Lotus Ruda is a 49 y.o. female with a history of hypertension, gastroparesis, diabetes, and previous cholecystectomy who presents to the emergency department complaining of general abdominal pain, nausea and vomiting since waking up this morning. She has a history of gastroparesis and reports this feels the same. She reports her nausea is the worst. She reports taking zofran without relief. She reports 3 episodes of NBNB vomiting today. She complains of 7/10 abdominal pain currently. She reports constipation and has not had a bowel movement in 6 days. She reports she is passing lots of gas. She also reports anxiety relating to her gastroparesis. The patient denies fevers, chills, hematemesis, squeezing, urinary symptoms, hematuria, lightheadedness, dizziness, headache, chest pain, or rashes.  (Consider location/radiation/quality/duration/timing/severity/associated sxs/prior Treatment) HPI  Past Medical History  Diagnosis Date  . Hypertension   . Cataract   . Anxiety   . Anemia   . GERD (gastroesophageal reflux disease)   . Constipation 04/03/2012  . Complication of anesthesia   . PONV (postoperative nausea and vomiting)   . Asthma     IN CHILDHOOD  . Neuromuscular disorder ([place])     Diabetic neuropathy  . Gastroparesis   . Diabetic neuropathy ([place])   . History of blood product transfusion   . Diabetes mellitus     diagnosed age 21   Past Surgical History  Procedure Laterality Date  . Retinal detachment surgery    . Posterior vitrectomy and membrane peel-left eye  09/28/2002  . Posterior vitrectomy and membrane peel-right eye  03/16/2002  . Eye surgery    . Gailstones     Family History  Problem Relation Age of Onset  . Cystic fibrosis Mother   . Hypertension Father    . Diabetes Brother   . Hypertension Maternal Grandmother    Social History  Substance Use Topics  . Smoking status: Never Smoker   . Smokeless tobacco: Never Used  . Alcohol Use: No     Comment: none per patient August 2016   OB History    No data available     Review of Systems  Constitutional: Negative for fever and chills.  HENT: Negative for congestion and sore throat.   Eyes: Negative for visual disturbance.  Respiratory: Negative for cough, shortness of breath and wheezing.   Cardiovascular: Negative for chest pain and palpitations.  Gastrointestinal: Positive for nausea, vomiting, abdominal pain and constipation. Negative for diarrhea and blood in stool.  Genitourinary: Negative for dysuria, urgency, frequency, hematuria, flank pain and difficulty urinating.  Musculoskeletal: Negative for back pain and neck pain.  Skin: Negative for rash.  Neurological: Negative for light-headedness and headaches.      Allergies  Anesthetics, amide; Penicillins; Buprenorphine hcl; Encainide; Metoclopramide; and Morphine and related  Home Medications   Prior to Admission medications   Medication Sig Start Date End Date Taking? Authorizing Provider  acetaminophen (TYLENOL) 325 MG tablet Take 650 mg by mouth every 6 (six) hours as needed for moderate pain (cramping).    Yes Historical Provider, MD  dronabinol (MARINOL) 10 MG capsule Take 10 mg by mouth 2 (two) times daily. 02/04/15  Yes Historical Provider, MD  ferrous sulfate 325 (65 FE) MG tablet Take 325 mg by mouth daily.  Yes Historical Provider, MD  insulin aspart (NOVOLOG) 100 UNIT/ML injection Inject 0-15 Units into the skin 3 (three) times daily with meals. 6-15 units three times daily with food.  Per sliding scale 03/12/15  Yes Belkys A Regalado, MD  insulin detemir (LEVEMIR) 100 UNIT/ML injection Inject 0.15 mLs (15 Units total) into the skin at bedtime. 02/17/13  Yes Robbie Lis, MD  LORazepam (ATIVAN) 0.5 MG tablet Take 1  tablet by mouth 3 (three) times daily as needed for anxiety.  11/29/14  Yes Historical Provider, MD  ondansetron (ZOFRAN ODT) 4 MG disintegrating tablet Take 1 tablet (4 mg total) by mouth every 8 (eight) hours as needed for nausea or vomiting. 03/21/15  Yes Nona Dell, PA-C  pantoprazole (PROTONIX) 40 MG tablet Take 40 mg by mouth 2 (two) times daily. 01/04/15  Yes Historical Provider, MD  pregabalin (LYRICA) 150 MG capsule Take 1 capsule (150 mg total) by mouth 2 (two) times daily. 02/17/13  Yes Robbie Lis, MD  ciprofloxacin (CIPRO) 500 MG tablet Take 1 tablet (500 mg total) by mouth 2 (two) times daily. Patient not taking: Reported on 03/13/2015 03/12/15   Belkys A Regalado, MD  metroNIDAZOLE (FLAGYL) 500 MG tablet Take 1 tablet (500 mg total) by mouth every 8 (eight) hours. Patient not taking: Reported on 03/13/2015 03/12/15   Belkys A Regalado, MD  ondansetron (ZOFRAN) 4 MG tablet Take 1 tablet (4 mg total) by mouth every 8 (eight) hours as needed for nausea or vomiting. Patient not taking: Reported on 03/13/2015 03/12/15   Belkys A Regalado, MD   BP 156/64 mmHg  Pulse 104  Temp(Src) 98.2 F (36.8 C) (Oral)  Resp 18  SpO2 100%  LMP 03/11/2015 Physical Exam  Constitutional: She is oriented to person, place, and time. She appears well-developed and well-nourished. No distress.  Nontoxic appearing.  HENT:  Head: Normocephalic and atraumatic.  Mouth/Throat: Oropharynx is clear and moist.  Eyes: Conjunctivae are normal. Pupils are equal, round, and reactive to light. Right eye exhibits no discharge. Left eye exhibits no discharge.  Neck: Neck supple.  Cardiovascular: Normal rate, regular rhythm, normal heart sounds and intact distal pulses.  Exam reveals no gallop and no friction rub.   No murmur heard. Pulmonary/Chest: Effort normal and breath sounds normal. No respiratory distress. She has no wheezes. She has no rales.  Abdominal: Soft. Bowel sounds are normal. She exhibits no  distension and no mass. There is tenderness. There is no rebound and no guarding.  Abdomen is soft. Bowel sounds are present. Patient has mild generalized abdominal tenderness to palpation without focal tenderness. No right lower quadrant tenderness to palpation. No psoas or obturator sign.  Musculoskeletal: She exhibits no edema or tenderness.  Lymphadenopathy:    She has no cervical adenopathy.  Neurological: She is alert and oriented to person, place, and time. Coordination normal.  Skin: Skin is warm and dry. No rash noted. She is not diaphoretic. No erythema. No pallor.  Psychiatric: She has a normal mood and affect. Her behavior is normal.  Nursing note and vitals reviewed.   ED Course  Procedures (including critical care time) Labs Review Labs Reviewed  COMPREHENSIVE METABOLIC PANEL - Abnormal; Notable for the following:    Chloride 99 (*)    Glucose, Bld 140 (*)    Total Bilirubin 1.3 (*)    All other components within normal limits  CBC - Abnormal; Notable for the following:    Platelets 429 (*)    All other  components within normal limits  URINALYSIS, ROUTINE W REFLEX MICROSCOPIC (NOT AT [facility]) - Abnormal; Notable for the following:    APPearance CLOUDY (*)    Protein, ur 30 (*)    Leukocytes, UA TRACE (*)    All other components within normal limits  URINE MICROSCOPIC-ADD ON - Abnormal; Notable for the following:    Squamous Epithelial / LPF MANY (*)    Bacteria, UA FEW (*)    All other components within normal limits  CBG MONITORING, ED - Abnormal; Notable for the following:    Glucose-Capillary 109 (*)    All other components within normal limits  CBG MONITORING, ED - Abnormal; Notable for the following:    Glucose-Capillary 131 (*)    All other components within normal limits  LIPASE, BLOOD  I-STAT BETA HCG BLOOD, ED ([facility], [facility], [facility] ONLY)    Imaging Review No results found. I have personally reviewed and evaluated these lab results as part of my medical  decision-making.   EKG Interpretation None      Filed Vitals:   03/30/15 1457 03/30/15 1754 03/30/15 2130  BP: 154/63 158/71 156/64  Pulse: 94 95 104  Temp: 98.3 F (36.8 C) 98.2 F (36.8 C)   TempSrc: Oral Oral   Resp:  16 18  SpO2: 100% 100% 100%     MDM   Meds given in ED:  Medications  sodium chloride 0.9 % bolus 1,000 mL (0 mLs Intravenous Stopped 03/30/15 1947)  ondansetron (ZOFRAN) injection 4 mg (4 mg Intravenous Given 03/30/15 1643)  LORazepam (ATIVAN) injection 1 mg (1 mg Intravenous Given 03/30/15 1643)  sodium chloride 0.9 % bolus 1,000 mL (1,000 mLs Intravenous New Bag/Given 03/30/15 1945)  haloperidol lactate (HALDOL) injection 5 mg (5 mg Intravenous Given 03/30/15 1945)    New Prescriptions   No medications on file    Final diagnoses:  Non-intractable vomiting with nausea, vomiting of unspecified type  Generalized abdominal pain  Gastroparesis   This is a 49 y.o. female with a history of hypertension, gastroparesis, diabetes, and previous cholecystectomy who presents to the emergency department complaining of general abdominal pain, nausea and vomiting since waking up this morning. She has a history of gastroparesis and reports this feels the same. She reports her nausea is the worst. She reports taking zofran without relief. She reports 3 episodes of NBNB vomiting today. She complains of 7/10 abdominal pain currently. She reports constipation and has not had a bowel movement in 6 days. She reports she is passing gas.  On exam the patient is afebrile nontoxic appearing. Her abdomen is soft and she has mild generalized tenderness to palpation without focal tenderness. No peritoneal signs. Bowel sounds are present. 17:10. Reevaluated patient and she has just received medications and fluids. Will give more time and allow for fluid bolus and reevaluate.  Urinalysis shows trace leukocytes which is a dirty catch. Patient has no urinary symptoms. Lipase is within normal  limits. CMP is remarkable only for a bilirubin of 1.3. Patient has a previous cholecystectomy. CBC is remarkable only for a platelet count of 429. No leukocytosis. She has a negative hCG. She reports passing gas, no need for abdominal series at this time.  After initial fluid bolus, Zofran and Ativan the patient reports some relief of her nausea and abdominal pain. Will try a second fluid bolus and Haldol and reevaluate. After Haldol and second fluid bolus the patient reports her nausea and abdominal pain has resolved. She has tolerated ginger ale without  vomiting. She reports feeling ready for discharge. I advised the patient she should use MiraLAX for her constipation. I encouraged her to follow-up with her gastroenterologist and her primary care provider this week. I advised the patient to follow-up with their primary care provider this week. I advised the patient to return to the emergency department with new or worsening symptoms or new concerns. The patient verbalized understanding and agreement with plan.    This patient was discussed with Dr. Laneta Simmers who agrees with assessment and plan.    Waynetta Pean, PA-C 03/30/15 2200  Leo Grosser, MD 03/31/15 1710

## 2015-04-04 ENCOUNTER — Encounter (HOSPITAL_COMMUNITY): Payer: Self-pay | Admitting: Oncology

## 2015-04-04 ENCOUNTER — Emergency Department (HOSPITAL_COMMUNITY)
Admission: EM | Admit: 2015-04-04 | Discharge: 2015-04-04 | Disposition: A | Discharge status: Home / Self Care | Payer: 59 | Source: Home / Self Care | Attending: Emergency Medicine | Admitting: Emergency Medicine

## 2015-04-04 ENCOUNTER — Emergency Department (HOSPITAL_COMMUNITY): Payer: 59

## 2015-04-04 ENCOUNTER — Encounter (HOSPITAL_COMMUNITY): Payer: Self-pay | Admitting: Emergency Medicine

## 2015-04-04 DIAGNOSIS — E876 Hypokalemia: Secondary | ICD-10-CM | POA: Diagnosis not present

## 2015-04-04 DIAGNOSIS — K5900 Constipation, unspecified: Secondary | ICD-10-CM | POA: Diagnosis present

## 2015-04-04 DIAGNOSIS — K3184 Gastroparesis: Secondary | ICD-10-CM | POA: Diagnosis present

## 2015-04-04 DIAGNOSIS — D509 Iron deficiency anemia, unspecified: Secondary | ICD-10-CM | POA: Diagnosis present

## 2015-04-04 DIAGNOSIS — I10 Essential (primary) hypertension: Secondary | ICD-10-CM | POA: Diagnosis present

## 2015-04-04 DIAGNOSIS — Z880 Allergy status to penicillin: Secondary | ICD-10-CM

## 2015-04-04 DIAGNOSIS — E1043 Type 1 diabetes mellitus with diabetic autonomic (poly)neuropathy: Secondary | ICD-10-CM | POA: Diagnosis present

## 2015-04-04 DIAGNOSIS — E1010 Type 1 diabetes mellitus with ketoacidosis without coma: Principal | ICD-10-CM | POA: Diagnosis present

## 2015-04-04 DIAGNOSIS — Z833 Family history of diabetes mellitus: Secondary | ICD-10-CM

## 2015-04-04 DIAGNOSIS — R112 Nausea with vomiting, unspecified: Secondary | ICD-10-CM | POA: Diagnosis not present

## 2015-04-04 DIAGNOSIS — Z794 Long term (current) use of insulin: Secondary | ICD-10-CM

## 2015-04-04 DIAGNOSIS — K219 Gastro-esophageal reflux disease without esophagitis: Secondary | ICD-10-CM | POA: Diagnosis present

## 2015-04-04 DIAGNOSIS — Z885 Allergy status to narcotic agent status: Secondary | ICD-10-CM

## 2015-04-04 DIAGNOSIS — Z8249 Family history of ischemic heart disease and other diseases of the circulatory system: Secondary | ICD-10-CM

## 2015-04-04 DIAGNOSIS — E860 Dehydration: Secondary | ICD-10-CM | POA: Diagnosis present

## 2015-04-04 DIAGNOSIS — N179 Acute kidney failure, unspecified: Secondary | ICD-10-CM | POA: Diagnosis present

## 2015-04-04 DIAGNOSIS — Z888 Allergy status to other drugs, medicaments and biological substances status: Secondary | ICD-10-CM

## 2015-04-04 DIAGNOSIS — F419 Anxiety disorder, unspecified: Secondary | ICD-10-CM | POA: Diagnosis present

## 2015-04-04 LAB — I-STAT CHEM 8, ED
BUN: 5 mg/dL — AB (ref 6–20)
CHLORIDE: 101 mmol/L (ref 101–111)
Calcium, Ion: 1.09 mmol/L — ABNORMAL LOW (ref 1.12–1.23)
Creatinine, Ser: 0.9 mg/dL (ref 0.44–1.00)
Glucose, Bld: 145 mg/dL — ABNORMAL HIGH (ref 65–99)
HEMATOCRIT: 41 % (ref 36.0–46.0)
Hemoglobin: 13.9 g/dL (ref 12.0–15.0)
POTASSIUM: 3.8 mmol/L (ref 3.5–5.1)
SODIUM: 137 mmol/L (ref 135–145)
TCO2: 22 mmol/L (ref 0–100)

## 2015-04-04 LAB — I-STAT BETA HCG BLOOD, ED (MC, WL, AP ONLY)

## 2015-04-04 LAB — CBG MONITORING, ED
GLUCOSE-CAPILLARY: 175 mg/dL — AB (ref 65–99)
Glucose-Capillary: 199 mg/dL — ABNORMAL HIGH (ref 65–99)

## 2015-04-04 MED ORDER — HALOPERIDOL LACTATE 5 MG/ML IJ SOLN
5.0000 mg | Freq: Once | INTRAMUSCULAR | Status: AC
  Administered 2015-04-04: 5 mg via INTRAMUSCULAR
  Filled 2015-04-04: qty 1

## 2015-04-04 MED ORDER — ONDANSETRON 8 MG PO TBDP
8.0000 mg | ORAL_TABLET | Freq: Once | ORAL | Status: AC
  Administered 2015-04-04: 8 mg via ORAL
  Filled 2015-04-04: qty 1

## 2015-04-04 MED ORDER — ONDANSETRON 8 MG PO TBDP: ORAL_TABLET | ORAL | Status: DC

## 2015-04-04 MED ORDER — HALOPERIDOL LACTATE 5 MG/ML IJ SOLN: 2.0000 mg | Freq: Once | INTRAMUSCULAR | Status: DC

## 2015-04-04 NOTE — ED Notes (Signed)
Pt. reports persistent nausea and vomitting , denies diarrhea / no fever or chills. Pt. was seen at [facility] ER this morning for the same complaint discharged home diagnosed with gastroparesis . Pt. received Zofran 4 mg IV by EMS prior to arrival with slight relief.

## 2015-04-04 NOTE — ED Notes (Signed)
Pt has hx of gastroparesis and over the past couple hours has become more nauseated and started having vomiting numerous times  Pt states she has taken her zofran but is unable to hold anything down  EMS unable to establish an IV

## 2015-04-04 NOTE — Discharge Instructions (Signed)
Gastroparesis °Gastroparesis, also called delayed gastric emptying, is a condition in which food takes longer than normal to empty from the stomach. The condition is usually long-lasting (chronic). °CAUSES °This condition may be caused by: °· An endocrine disorder, such as hypothyroidism or diabetes. Diabetes is the most common cause of this condition. °· A nervous system disease, such as Parkinson disease or multiple sclerosis. °· Cancer, infection, or surgery of the stomach or vagus nerve. °· A connective tissue disorder, such as scleroderma. °· Certain medicines. °In most cases, the cause is not known. °RISK FACTORS °This condition is more likely to develop in: °· People with certain disorders, including endocrine disorders, eating disorders, amyloidosis, and scleroderma. °· People with certain diseases, including Parkinson disease or multiple sclerosis. °· People with cancer or infection of the stomach or vagus nerve. °· People who have had surgery on the stomach or vagus nerve. °· People who take certain medicines. °· Women. °SYMPTOMS °Symptoms of this condition include: °· An early feeling of fullness when eating. °· Nausea. °· Weight loss. °· Vomiting. °· Heartburn. °· Abdominal bloating. °· Inconsistent blood glucose levels. °· Lack of appetite. °· Acid from the stomach coming up into the esophagus (gastroesophageal reflux). °· Spasms of the stomach. °Symptoms may come and go. °DIAGNOSIS °This condition is diagnosed with tests, such as: °· Tests that check how long it takes food to move through the stomach and intestines. These tests include: °¨ Upper gastrointestinal (GI) series. In this test, X-rays of the intestines are taken after you drink a liquid. The liquid makes the intestines show up better on the X-rays. °¨ Gastric emptying scintigraphy. In this test, scans are taken after you eat food that contains a small amount of radioactive material. °¨ Wireless capsule GI monitoring system. This test  involves swallowing a capsule that records information about movement through the stomach. °· Gastric manometry. This test measures electrical and muscular activity in the stomach. It is done with a thin tube that is passed down the throat and into the stomach. °· Endoscopy. This test checks for abnormalities in the lining of the stomach. It is done with a long, thin tube that is passed down the throat and into the stomach. °· An ultrasound. This test can help rule out gallbladder disease or pancreatitis as a cause of your symptoms. It uses sound waves to take pictures of the inside of your body. °TREATMENT °There is no cure for gastroparesis. This condition may be managed with: °· Treatment of the underlying condition causing the gastroparesis. °· Lifestyle changes, including exercise and dietary changes. Dietary changes can include: °¨ Changes in what and when you eat. °¨ Eating smaller meals more often. °¨ Eating low-fat foods. °¨ Eating low-fiber forms of high-fiber foods, such as cooked vegetables instead of raw vegetables. °¨ Having liquid foods in place of solid foods. Liquid foods are easier to digest. °· Medicines. These may be given to control nausea and vomiting and to stimulate stomach muscles. °· Getting food through a feeding tube. This may be done in severe cases. °· A gastric neurostimulator. This is a device that is inserted into the body with surgery. It helps improve stomach emptying and control nausea and vomiting. °HOME CARE INSTRUCTIONS °· Follow your health care provider's instructions about exercise and diet. °· Take medicines only as directed by your health care provider. °SEEK MEDICAL CARE IF: °· Your symptoms do not improve with treatment. °· You have new symptoms. °SEEK IMMEDIATE MEDICAL CARE IF: °· You have   severe abdominal pain that does not improve with treatment. °· You have nausea that does not go away. °· You cannot keep fluids down. °  °This information is not intended to replace  advice given to you by your health care provider. Make sure you discuss any questions you have with your health care provider. °  °Document Released: 06/11/2005 Document Revised: 10/26/2014 Document Reviewed: 06/07/2014 °[employer] Interactive Patient Education ©2016 [employer] ° °

## 2015-04-04 NOTE — ED Provider Notes (Signed)
CSN: [id number]     Arrival date & time 04/04/15  0226 History  By signing my name below, I, Stephania Fragmin, attest that this documentation has been prepared under the direction and in the presence of Yareli Carthen, MD. Electronically Signed: Stephania Fragmin, ED Scribe. 04/04/2015. 3:46 AM.   Chief Complaint  Patient presents with  . Emesis   Patient is a 49 y.o. female presenting with vomiting. The history is provided by the patient. No language interpreter was used.  Emesis Severity:  Moderate Duration:  3 hours Timing:  Constant Progression:  Worsening Chronicity:  Recurrent Context: not post-tussive and not self-induced   Relieved by:  Nothing Worsened by:  Nothing tried Ineffective treatments: unable to hold down Zofran. Risk factors comment:  History of gastroparesis   HPI Comments: [name] is a 49 y.o. female with a history of gastroparesis, constipation, DM, and anxiety, who presents to the Emergency Department complaining of nausea and vomiting that began 3 hours ago. Patient also complains of constipation that began about 5 days ago. Patient reports she took a laxative that day, which caused her to have a BM 3 times. Patient notes that her anxiety has been running high, as she states whenever she has issues with gastroparesis. Patient states she normally takes Marinol and Zofran at home for gastroparesis, but she has been unable to hold any Zofran down due to her symptoms. She reports she does not take Reglan due to adverse side effects. Patient denies undergoing chemotherapy. Patient reports compliance with her insulin therapy, but she did not take any this morning.  PCP: Dr. Jonelle Sidle  Past Medical History  Diagnosis Date  . Hypertension   . Cataract   . Anxiety   . Anemia   . GERD (gastroesophageal reflux disease)   . Constipation 04/03/2012  . Complication of anesthesia   . PONV (postoperative nausea and vomiting)   . Asthma     IN CHILDHOOD  . Neuromuscular disorder ([place])      Diabetic neuropathy  . Gastroparesis   . Diabetic neuropathy ([place])   . History of blood product transfusion   . Diabetes mellitus     diagnosed age 40   Past Surgical History  Procedure Laterality Date  . Retinal detachment surgery    . Posterior vitrectomy and membrane peel-left eye  09/28/2002  . Posterior vitrectomy and membrane peel-right eye  03/16/2002  . Eye surgery    . Gailstones     Family History  Problem Relation Age of Onset  . Cystic fibrosis Mother   . Hypertension Father   . Diabetes Brother   . Hypertension Maternal Grandmother    Social History  Substance Use Topics  . Smoking status: Never Smoker   . Smokeless tobacco: Never Used  . Alcohol Use: No     Comment: none per patient August 2016   OB History    No data available     Review of Systems  Gastrointestinal: Positive for nausea, vomiting and constipation.  Psychiatric/Behavioral: The patient is nervous/anxious.   All other systems reviewed and are negative.  Allergies  Anesthetics, amide; Penicillins; Buprenorphine hcl; Encainide; Metoclopramide; and Morphine and related  Home Medications   Prior to Admission medications   Medication Sig Start Date End Date Taking? Authorizing Provider  acetaminophen (TYLENOL) 325 MG tablet Take 650 mg by mouth every 6 (six) hours as needed for moderate pain (cramping).     Historical Provider, MD  ciprofloxacin (CIPRO) 500 MG tablet Take 1 tablet (  500 mg total) by mouth 2 (two) times daily. Patient not taking: Reported on 03/13/2015 03/12/15   Belkys A Regalado, MD  dronabinol (MARINOL) 10 MG capsule Take 10 mg by mouth 2 (two) times daily. 02/04/15   Historical Provider, MD  ferrous sulfate 325 (65 FE) MG tablet Take 325 mg by mouth daily.     Historical Provider, MD  insulin aspart (NOVOLOG) 100 UNIT/ML injection Inject 0-15 Units into the skin 3 (three) times daily with meals. 6-15 units three times daily with food.  Per sliding scale 03/12/15   Belkys A  Regalado, MD  insulin detemir (LEVEMIR) 100 UNIT/ML injection Inject 0.15 mLs (15 Units total) into the skin at bedtime. 02/17/13   Robbie Lis, MD  LORazepam (ATIVAN) 0.5 MG tablet Take 1 tablet by mouth 3 (three) times daily as needed for anxiety.  11/29/14   Historical Provider, MD  metroNIDAZOLE (FLAGYL) 500 MG tablet Take 1 tablet (500 mg total) by mouth every 8 (eight) hours. Patient not taking: Reported on 03/13/2015 03/12/15   Belkys A Regalado, MD  ondansetron (ZOFRAN ODT) 4 MG disintegrating tablet Take 1 tablet (4 mg total) by mouth every 8 (eight) hours as needed for nausea or vomiting. 03/21/15   Nona Dell, PA-C  ondansetron (ZOFRAN) 4 MG tablet Take 1 tablet (4 mg total) by mouth every 8 (eight) hours as needed for nausea or vomiting. Patient not taking: Reported on 03/13/2015 03/12/15   Belkys A Regalado, MD  pantoprazole (PROTONIX) 40 MG tablet Take 40 mg by mouth 2 (two) times daily. 01/04/15   Historical Provider, MD  pregabalin (LYRICA) 150 MG capsule Take 1 capsule (150 mg total) by mouth 2 (two) times daily. 02/17/13   Robbie Lis, MD   BP 167/73 mmHg  Pulse 86  Temp(Src) 97.8 F (36.6 C) (Oral)  Resp 20  SpO2 100%  LMP 03/11/2015 Physical Exam  Constitutional: She is oriented to person, place, and time. She appears well-developed and well-nourished. No distress.  HENT:  Head: Normocephalic and atraumatic.  Mouth/Throat: Oropharynx is clear and moist.  Moist mm. No exudate.  Eyes: Conjunctivae and EOM are normal. Pupils are equal, round, and reactive to light.  Neck: Normal range of motion. Neck supple. No tracheal deviation present.  Cardiovascular: Normal rate and regular rhythm.   Pulmonary/Chest: Effort normal and breath sounds normal. No respiratory distress. She has no wheezes. She has no rales.  Lungs are clear to auscultation.   Abdominal: Soft. Bowel sounds are normal. She exhibits no mass. There is no tenderness. There is no rebound and no guarding.   Constipation palpable.  Musculoskeletal: Normal range of motion. She exhibits no edema or tenderness.  Neurological: She is alert and oriented to person, place, and time. She has normal reflexes.  DTRs intact.  Skin: Skin is warm and dry.  Psychiatric: She has a normal mood and affect. Her behavior is normal.  Nursing note and vitals reviewed.   ED Course  Procedures (including critical care time)  DIAGNOSTIC STUDIES: Oxygen Saturation is 100% on RA, normal by my interpretation.    COORDINATION OF CARE: 3:17 AM - Discussed treatment plan with pt at bedside. Pt verbalized understanding and agreed to plan.   Labs Review Labs Reviewed  URINALYSIS, ROUTINE W REFLEX MICROSCOPIC (NOT AT [facility])  POC URINE PREG, ED  I-STAT CHEM 8, ED   Imaging Review No results found. I have personally reviewed and evaluated these images and lab results as part of my medical  decision-making.   EKG Interpretation None      MDM   Final diagnoses:  None   Results for orders placed or performed during the hospital encounter of 04/04/15  I-stat chem 8, ed  Result Value Ref Range   Sodium 137 135 - 145 mmol/L   Potassium 3.8 3.5 - 5.1 mmol/L   Chloride 101 101 - 111 mmol/L   BUN 5 (L) 6 - 20 mg/dL   Creatinine, Ser 0.90 0.44 - 1.00 mg/dL   Glucose, Bld 145 (H) 65 - 99 mg/dL   Calcium, Ion 1.09 (L) 1.12 - 1.23 mmol/L   TCO2 22 0 - 100 mmol/L   Hemoglobin 13.9 12.0 - 15.0 g/dL   HCT 41.0 36.0 - 46.0 %  I-Stat Beta hCG blood, ED ([facility], [facility], [facility] only)  Result Value Ref Range   I-stat hCG, quantitative <5.0 <5 mIU/mL   Comment 3          CBG monitoring, ED  Result Value Ref Range   Glucose-Capillary 175 (H) 65 - 99 mg/dL   Ct Abdomen Pelvis Wo Contrast  03/10/2015   CLINICAL DATA:  Diabetes. Constipation. Gastro paresis. Currently presents with intractable nausea and vomiting for several days with associated abdominal cramps.  EXAM: CT ABDOMEN AND PELVIS WITHOUT CONTRAST  TECHNIQUE:  Multidetector CT imaging of the abdomen and pelvis was performed following the standard protocol without IV contrast.  COMPARISON:  11/15/2014  FINDINGS: Exam detail is diminished secondary to lack of IV contrast material.  Lower chest: The lung bases are clear. No pleural effusion identified.  Hepatobiliary: Previous cholecystectomy. The liver appears normal. No biliary dilatation identified.  Pancreas: Unremarkable appearance of the pancreas.  Spleen: The spleen is unremarkable.  Adrenals/Urinary Tract: Unremarkable appearance of the adrenal glands. Normal appearance of the kidneys. No hydronephrosis or hydroureter identified. There is moderate distension of the urinary bladder.  Stomach/Bowel: The stomach is normal. The small bowel loops have a normal caliber. There is no evidence for small bowel obstruction. The appendix is visualized and appears normal. Evaluation of the colon is diminished due to lack of both IV contrast material and oral contrast within the colon. Within this limitation there may be mild diffuse colonic wall thickening versus colonic under distention. No evidence for large bowel obstruction. No pneumatosis or bowel perforation.  Vascular/Lymphatic: Calcified atherosclerotic disease involves the abdominal aorta. No aneurysm. No enlarged retroperitoneal or mesenteric adenopathy. No enlarged pelvic or inguinal lymph nodes.  Reproductive: The uterus and the adnexal structures are unremarkable.  Other: No free fluid or fluid collections identified within the abdomen or pelvis.  Musculoskeletal: No aggressive lytic or sclerotic bone lesions.  IMPRESSION: 1. Exam detail diminished due to lack of IV contrast material. Additionally, the oral contrast material fills only the normal appearing small bowel loops. 2. Incomplete distention versus mild wall thickening involving the colon which may indicate colitis. No pneumatosis, abnormal bowel distention or bowel perforation identified. 3. Previous  cholecystectomy 4. Aortic atherosclerosis.   Electronically Signed   By: Kerby Moors M.D.   On: 03/10/2015 16:31   Ir Fluoro Guide Cv Line Right  03/10/2015   CLINICAL DATA:  Nausea vomiting.  Needs  IV access.  EXAM: PICC PLACEMENT WITH ULTRASOUND AND FLUOROSCOPY  FLUOROSCOPY TIME:  6 seconds, 3 mGy  TECHNIQUE: After written informed consent was obtained, patient was placed in the supine position on angiographic table. Patency of the right basilic vein was confirmed with ultrasound with image documentation. An appropriate skin site was  determined. Skin site was marked. Region was prepped using maximum barrier technique including cap and mask, sterile gown, sterile gloves, large sterile sheet, and Chlorhexidine as cutaneous antisepsis. The region was infiltrated locally with 1% lidocaine. Under real-time ultrasound guidance, the right basilic vein was accessed with a 21 gauge micropuncture needle; the needle tip within the vein was confirmed with ultrasound image documentation. Needle exchanged over a 018 guidewire for a peel-away sheath, through which a 5-French double-lumen power injectable PICC trimmed to 33cm was advanced, positioned with its tip near the cavoatrial junction. Spot chest radiograph confirms appropriate catheter position. Catheter was flushed per protocol and secured externally. The patient tolerated procedure well.  COMPLICATIONS: COMPLICATIONS none  IMPRESSION: 1. Technically successful five [place] double lumen power injectable PICC placement   Electronically Signed   By: Lucrezia Europe M.D.   On: 03/10/2015 17:01   Ir US Guide Vasc Access Right  03/10/2015   CLINICAL DATA:  Nausea vomiting.  Needs  IV access.  EXAM: PICC PLACEMENT WITH ULTRASOUND AND FLUOROSCOPY  FLUOROSCOPY TIME:  6 seconds, 3 mGy  TECHNIQUE: After written informed consent was obtained, patient was placed in the supine position on angiographic table. Patency of the right basilic vein was confirmed with ultrasound with  image documentation. An appropriate skin site was determined. Skin site was marked. Region was prepped using maximum barrier technique including cap and mask, sterile gown, sterile gloves, large sterile sheet, and Chlorhexidine as cutaneous antisepsis. The region was infiltrated locally with 1% lidocaine. Under real-time ultrasound guidance, the right basilic vein was accessed with a 21 gauge micropuncture needle; the needle tip within the vein was confirmed with ultrasound image documentation. Needle exchanged over a 018 guidewire for a peel-away sheath, through which a 5-French double-lumen power injectable PICC trimmed to 33cm was advanced, positioned with its tip near the cavoatrial junction. Spot chest radiograph confirms appropriate catheter position. Catheter was flushed per protocol and secured externally. The patient tolerated procedure well.  COMPLICATIONS: COMPLICATIONS none  IMPRESSION: 1. Technically successful five [place] double lumen power injectable PICC placement   Electronically Signed   By: Lucrezia Europe M.D.   On: 03/10/2015 17:01   Dg Abd 2 Views  03/21/2015   CLINICAL DATA:  Two day history of nausea and vomiting with abdominal pain  EXAM: ABDOMEN - 2 VIEW  COMPARISON:  CT abdomen and pelvis March 10, 2015  FINDINGS: Supine and upright images were obtained. There is a relative paucity of gas. There is no bowel dilatation or air-fluid level suggesting obstruction. No free air. A small benign-appearing calcification is noted in the mid pelvic region. Lung bases are clear. There are surgical clips in the right upper quadrant region.  IMPRESSION: Relative paucity of gas. This finding may be seen normally. It also, however, may be seen with early ileus or enteritis. Obstruction is not felt to be likely. No free air.   Electronically Signed   By: Lowella Grip III M.D.   On: 03/21/2015 07:02   Dg Abd Acute W/chest  04/04/2015   CLINICAL DATA:  Nausea and vomiting beginning 3 hours ago.  Constipation for 5 days. History of gastroparesis.  EXAM: DG ABDOMEN ACUTE W/ 1V CHEST  COMPARISON:  03/21/2015  FINDINGS: Normal heart size and pulmonary vascularity. No focal airspace disease or consolidation in the lungs. No blunting of costophrenic angles. No pneumothorax. Mediastinal contours appear intact.  Scattered gas and stool in the colon. No small or large bowel distention. No free intra-abdominal air. No  abnormal air-fluid levels. No radiopaque stones. Visualized bones appear intact. Surgical clips in the right upper quadrant.  IMPRESSION: No evidence of active pulmonary disease. Normal nonobstructive bowel gas pattern. No gastric distention.   Electronically Signed   By: Lucienne Capers M.D.   On: 04/04/2015 05:15    Medications  ondansetron (ZOFRAN-ODT) disintegrating tablet 8 mg (8 mg Oral Given 04/04/15 0351)  haloperidol lactate (HALDOL) injection 5 mg (5 mg Intramuscular Given 04/04/15 0351)   No further emesis.  Resting comfortably in bed.  Stable for discharge at this time.  Follow up with your PMD in the am for recheck   I, Kemarion Abbey-RASCH,Jackie Littlejohn K, personally performed the services described in this documentation. All medical record entries made by the scribe were at my direction and in my presence.  I have reviewed the chart and discharge instructions and agree that the record reflects my personal performance and is accurate and complete. Mckyle Solanki-RASCH,Jaycie Kregel K.  04/05/2015. 12:46 AM.       Dhanvin Szeto, MD 04/05/15 [phone]

## 2015-04-05 ENCOUNTER — Inpatient Hospital Stay (HOSPITAL_COMMUNITY)
Admission: EM | Admit: 2015-04-05 | Discharge: 2015-04-09 | DRG: 638 | Disposition: A | Discharge status: Home / Self Care | Payer: 59 | Attending: Family Medicine | Admitting: Family Medicine

## 2015-04-05 ENCOUNTER — Encounter (HOSPITAL_COMMUNITY): Payer: Self-pay | Admitting: Internal Medicine

## 2015-04-05 ENCOUNTER — Encounter (HOSPITAL_COMMUNITY): Payer: Self-pay | Admitting: Emergency Medicine

## 2015-04-05 DIAGNOSIS — D509 Iron deficiency anemia, unspecified: Secondary | ICD-10-CM | POA: Diagnosis present

## 2015-04-05 DIAGNOSIS — E876 Hypokalemia: Secondary | ICD-10-CM | POA: Diagnosis not present

## 2015-04-05 DIAGNOSIS — E1043 Type 1 diabetes mellitus with diabetic autonomic (poly)neuropathy: Secondary | ICD-10-CM | POA: Diagnosis present

## 2015-04-05 DIAGNOSIS — E1310 Other specified diabetes mellitus with ketoacidosis without coma: Secondary | ICD-10-CM

## 2015-04-05 DIAGNOSIS — E860 Dehydration: Secondary | ICD-10-CM

## 2015-04-05 DIAGNOSIS — E0810 Diabetes mellitus due to underlying condition with ketoacidosis without coma: Secondary | ICD-10-CM | POA: Diagnosis not present

## 2015-04-05 DIAGNOSIS — Z833 Family history of diabetes mellitus: Secondary | ICD-10-CM | POA: Diagnosis not present

## 2015-04-05 DIAGNOSIS — E1143 Type 2 diabetes mellitus with diabetic autonomic (poly)neuropathy: Secondary | ICD-10-CM | POA: Diagnosis not present

## 2015-04-05 DIAGNOSIS — R112 Nausea with vomiting, unspecified: Secondary | ICD-10-CM | POA: Diagnosis present

## 2015-04-05 DIAGNOSIS — Z794 Long term (current) use of insulin: Secondary | ICD-10-CM | POA: Diagnosis not present

## 2015-04-05 DIAGNOSIS — E1010 Type 1 diabetes mellitus with ketoacidosis without coma: Secondary | ICD-10-CM | POA: Diagnosis present

## 2015-04-05 DIAGNOSIS — K219 Gastro-esophageal reflux disease without esophagitis: Secondary | ICD-10-CM | POA: Diagnosis present

## 2015-04-05 DIAGNOSIS — Z880 Allergy status to penicillin: Secondary | ICD-10-CM | POA: Diagnosis not present

## 2015-04-05 DIAGNOSIS — Z8249 Family history of ischemic heart disease and other diseases of the circulatory system: Secondary | ICD-10-CM | POA: Diagnosis not present

## 2015-04-05 DIAGNOSIS — Z885 Allergy status to narcotic agent status: Secondary | ICD-10-CM | POA: Diagnosis not present

## 2015-04-05 DIAGNOSIS — F419 Anxiety disorder, unspecified: Secondary | ICD-10-CM | POA: Diagnosis present

## 2015-04-05 DIAGNOSIS — K3184 Gastroparesis: Secondary | ICD-10-CM | POA: Diagnosis present

## 2015-04-05 DIAGNOSIS — Z888 Allergy status to other drugs, medicaments and biological substances status: Secondary | ICD-10-CM | POA: Diagnosis not present

## 2015-04-05 DIAGNOSIS — N179 Acute kidney failure, unspecified: Secondary | ICD-10-CM | POA: Diagnosis present

## 2015-04-05 DIAGNOSIS — I10 Essential (primary) hypertension: Secondary | ICD-10-CM | POA: Diagnosis present

## 2015-04-05 DIAGNOSIS — K5900 Constipation, unspecified: Secondary | ICD-10-CM | POA: Diagnosis present

## 2015-04-05 LAB — CBC WITH DIFFERENTIAL/PLATELET
BASOS PCT: 0 %
Basophils Absolute: 0 10*3/uL (ref 0.0–0.1)
Basophils Absolute: 0 10*3/uL (ref 0.0–0.1)
Basophils Relative: 0 %
EOS ABS: 0 10*3/uL (ref 0.0–0.7)
EOS ABS: 0 10*3/uL (ref 0.0–0.7)
EOS PCT: 0 %
EOS PCT: 0 %
HCT: 32.7 % — ABNORMAL LOW (ref 36.0–46.0)
HCT: 34.3 % — ABNORMAL LOW (ref 36.0–46.0)
HEMOGLOBIN: 11.2 g/dL — AB (ref 12.0–15.0)
Hemoglobin: 10.4 g/dL — ABNORMAL LOW (ref 12.0–15.0)
LYMPHS ABS: 0.7 10*3/uL (ref 0.7–4.0)
Lymphocytes Relative: 10 %
Lymphocytes Relative: 4 %
Lymphs Abs: 0.4 10*3/uL — ABNORMAL LOW (ref 0.7–4.0)
MCH: 29 pg (ref 26.0–34.0)
MCH: 29.4 pg (ref 26.0–34.0)
MCHC: 31.8 g/dL (ref 30.0–36.0)
MCHC: 32.7 g/dL (ref 30.0–36.0)
MCV: 91.1 fL (ref 78.0–100.0)
MCV: 90 fL (ref 78.0–100.0)
MONOS PCT: 5 %
Monocytes Absolute: 0.3 10*3/uL (ref 0.1–1.0)
Monocytes Absolute: 0.2 10*3/uL (ref 0.1–1.0)
Monocytes Relative: 2 %
NEUTROS PCT: 94 %
Neutro Abs: 5.7 10*3/uL (ref 1.7–7.7)
Neutro Abs: 9.2 10*3/uL — ABNORMAL HIGH (ref 1.7–7.7)
Neutrophils Relative %: 85 %
PLATELETS: 281 10*3/uL (ref 150–400)
PLATELETS: 337 10*3/uL (ref 150–400)
RBC: 3.59 MIL/uL — ABNORMAL LOW (ref 3.87–5.11)
RBC: 3.81 MIL/uL — AB (ref 3.87–5.11)
RDW: 14.7 % (ref 11.5–15.5)
RDW: 15.1 % (ref 11.5–15.5)
WBC: 6.7 10*3/uL (ref 4.0–10.5)
WBC: 9.7 10*3/uL (ref 4.0–10.5)

## 2015-04-05 LAB — BASIC METABOLIC PANEL
ANION GAP: 17 — AB (ref 5–15)
ANION GAP: 20 — AB (ref 5–15)
ANION GAP: 13 (ref 5–15)
ANION GAP: 13 (ref 5–15)
BUN: 9 mg/dL (ref 6–20)
BUN: 9 mg/dL (ref 6–20)
BUN: 5 mg/dL — ABNORMAL LOW (ref 6–20)
CHLORIDE: 108 mmol/L (ref 101–111)
CHLORIDE: 112 mmol/L — AB (ref 101–111)
CHLORIDE: 108 mmol/L (ref 101–111)
CO2: 16 mmol/L — ABNORMAL LOW (ref 22–32)
CO2: 12 mmol/L — AB (ref 22–32)
CO2: 14 mmol/L — ABNORMAL LOW (ref 22–32)
CO2: 18 mmol/L — ABNORMAL LOW (ref 22–32)
CREATININE: 1.15 mg/dL — AB (ref 0.44–1.00)
Calcium: 8.7 mg/dL — ABNORMAL LOW (ref 8.9–10.3)
Calcium: 9 mg/dL (ref 8.9–10.3)
Calcium: 9 mg/dL (ref 8.9–10.3)
Calcium: 8.8 mg/dL — ABNORMAL LOW (ref 8.9–10.3)
Chloride: 104 mmol/L (ref 101–111)
Creatinine, Ser: 1.18 mg/dL — ABNORMAL HIGH (ref 0.44–1.00)
Creatinine, Ser: 0.82 mg/dL (ref 0.44–1.00)
Creatinine, Ser: 0.83 mg/dL (ref 0.44–1.00)
GFR calc Af Amer: >60 mL/min (ref >60)
GFR calc Af Amer: >60 mL/min (ref >60)
GFR calc Af Amer: >60 mL/min (ref >60)
GFR calc non Af Amer: 54 mL/min — ABNORMAL LOW (ref >60)
GFR calc non Af Amer: 56 mL/min — ABNORMAL LOW (ref >60)
GFR calc non Af Amer: >60 mL/min (ref >60)
GLUCOSE: 429 mg/dL — AB (ref 65–99)
GLUCOSE: 128 mg/dL — AB (ref 65–99)
GLUCOSE: 137 mg/dL — AB (ref 65–99)
Glucose, Bld: 224 mg/dL — ABNORMAL HIGH (ref 65–99)
POTASSIUM: 4.1 mmol/L (ref 3.5–5.1)
POTASSIUM: 3.4 mmol/L — AB (ref 3.5–5.1)
POTASSIUM: 4.9 mmol/L (ref 3.5–5.1)
POTASSIUM: 4 mmol/L (ref 3.5–5.1)
SODIUM: 139 mmol/L (ref 135–145)
Sodium: 137 mmol/L (ref 135–145)
Sodium: 140 mmol/L (ref 135–145)
Sodium: 139 mmol/L (ref 135–145)

## 2015-04-05 LAB — RAPID URINE DRUG SCREEN, HOSP PERFORMED
AMPHETAMINES: NOT DETECTED
BARBITURATES: NOT DETECTED
BENZODIAZEPINES: NOT DETECTED
COCAINE: NOT DETECTED
Opiates: NOT DETECTED
TETRAHYDROCANNABINOL: POSITIVE — AB

## 2015-04-05 LAB — GLUCOSE, CAPILLARY
GLUCOSE-CAPILLARY: 129 mg/dL — AB (ref 65–99)
GLUCOSE-CAPILLARY: 143 mg/dL — AB (ref 65–99)
GLUCOSE-CAPILLARY: 152 mg/dL — AB (ref 65–99)
GLUCOSE-CAPILLARY: 156 mg/dL — AB (ref 65–99)
GLUCOSE-CAPILLARY: 177 mg/dL — AB (ref 65–99)
GLUCOSE-CAPILLARY: 179 mg/dL — AB (ref 65–99)
GLUCOSE-CAPILLARY: 190 mg/dL — AB (ref 65–99)
GLUCOSE-CAPILLARY: 193 mg/dL — AB (ref 65–99)
GLUCOSE-CAPILLARY: 193 mg/dL — AB (ref 65–99)
GLUCOSE-CAPILLARY: 205 mg/dL — AB (ref 65–99)
Glucose-Capillary: 382 mg/dL — ABNORMAL HIGH (ref 65–99)
Glucose-Capillary: 317 mg/dL — ABNORMAL HIGH (ref 65–99)
Glucose-Capillary: 119 mg/dL — ABNORMAL HIGH (ref 65–99)
Glucose-Capillary: 131 mg/dL — ABNORMAL HIGH (ref 65–99)
Glucose-Capillary: 132 mg/dL — ABNORMAL HIGH (ref 65–99)
Glucose-Capillary: 148 mg/dL — ABNORMAL HIGH (ref 65–99)
Glucose-Capillary: 140 mg/dL — ABNORMAL HIGH (ref 65–99)
Glucose-Capillary: 166 mg/dL — ABNORMAL HIGH (ref 65–99)

## 2015-04-05 LAB — URINALYSIS, ROUTINE W REFLEX MICROSCOPIC
BILIRUBIN URINE: NEGATIVE
HGB URINE DIPSTICK: NEGATIVE
Ketones, ur: >80 mg/dL — AB
Leukocytes, UA: NEGATIVE
Nitrite: NEGATIVE
PH: 6 (ref 5.0–8.0)
Protein, ur: NEGATIVE mg/dL
SPECIFIC GRAVITY, URINE: 1.02 (ref 1.005–1.030)
Urobilinogen, UA: 1 mg/dL (ref 0.0–1.0)

## 2015-04-05 LAB — URINE MICROSCOPIC-ADD ON

## 2015-04-05 LAB — I-STAT CHEM 8, ED
BUN: 10 mg/dL (ref 6–20)
CALCIUM ION: 1.1 mmol/L — AB (ref 1.12–1.23)
CREATININE: 0.7 mg/dL (ref 0.44–1.00)
Chloride: 104 mmol/L (ref 101–111)
Glucose, Bld: 468 mg/dL — ABNORMAL HIGH (ref 65–99)
HCT: 33 % — ABNORMAL LOW (ref 36.0–46.0)
Hemoglobin: 11.2 g/dL — ABNORMAL LOW (ref 12.0–15.0)
Potassium: 4.1 mmol/L (ref 3.5–5.1)
Sodium: 138 mmol/L (ref 135–145)
TCO2: 15 mmol/L (ref 0–100)

## 2015-04-05 LAB — TSH: TSH: 0.68 u[IU]/mL (ref 0.350–4.500)

## 2015-04-05 LAB — MRSA PCR SCREENING: MRSA BY PCR: NEGATIVE

## 2015-04-05 LAB — PREGNANCY, URINE: PREG TEST UR: NEGATIVE

## 2015-04-05 LAB — CBG MONITORING, ED: GLUCOSE-CAPILLARY: 382 mg/dL — AB (ref 65–99)

## 2015-04-05 LAB — TROPONIN I: Troponin I: <0.03 ng/mL (ref <0.031)

## 2015-04-05 MED ORDER — POTASSIUM CHLORIDE 10 MEQ/100ML IV SOLN
10.0000 meq | INTRAVENOUS | Status: AC
  Administered 2015-04-05 (×4): 10 meq via INTRAVENOUS
  Filled 2015-04-05 (×3): qty 100

## 2015-04-05 MED ORDER — HEPARIN SODIUM (PORCINE) 5000 UNIT/ML IJ SOLN: 5000.0000 [IU] | Freq: Three times a day (TID) | INTRAMUSCULAR | Status: DC

## 2015-04-05 MED ORDER — PANTOPRAZOLE SODIUM 40 MG IV SOLR
40.0000 mg | Freq: Two times a day (BID) | INTRAVENOUS | Status: DC
Start: 2015-04-05 — End: 2015-04-06
  Administered 2015-04-05 – 2015-04-06 (×3): 40 mg via INTRAVENOUS
  Filled 2015-04-05 (×3): qty 40

## 2015-04-05 MED ORDER — HYDROMORPHONE HCL 1 MG/ML IJ SOLN
0.5000 mg | Freq: Four times a day (QID) | INTRAMUSCULAR | Status: DC | PRN
  Administered 2015-04-05 (×2): 0.5 mg via INTRAVENOUS
  Filled 2015-04-05 (×2): qty 1

## 2015-04-05 MED ORDER — ONDANSETRON HCL 4 MG/2ML IJ SOLN
4.0000 mg | Freq: Once | INTRAMUSCULAR | Status: AC
Start: 2015-04-05 — End: 2015-04-05
  Administered 2015-04-05: 4 mg via INTRAVENOUS
  Filled 2015-04-05: qty 2

## 2015-04-05 MED ORDER — PROMETHAZINE HCL 25 MG/ML IJ SOLN
12.5000 mg | Freq: Four times a day (QID) | INTRAMUSCULAR | Status: DC | PRN
  Administered 2015-04-05 – 2015-04-06 (×4): 12.5 mg via INTRAVENOUS
  Filled 2015-04-05 (×4): qty 1

## 2015-04-05 MED ORDER — SODIUM CHLORIDE 0.9 % IV SOLN
INTRAVENOUS | Status: DC
  Filled 2015-04-05: qty 2.5

## 2015-04-05 MED ORDER — SODIUM CHLORIDE 0.9 % IV SOLN: INTRAVENOUS | Status: DC

## 2015-04-05 MED ORDER — HALOPERIDOL LACTATE 5 MG/ML IJ SOLN
2.0000 mg | Freq: Once | INTRAMUSCULAR | Status: AC
  Administered 2015-04-05: 2 mg via INTRAVENOUS
  Filled 2015-04-05: qty 1

## 2015-04-05 MED ORDER — SODIUM CHLORIDE 0.9 % IV BOLUS (SEPSIS)
1000.0000 mL | Freq: Once | INTRAVENOUS | Status: AC
  Administered 2015-04-05: 1000 mL via INTRAVENOUS

## 2015-04-05 MED ORDER — ONDANSETRON HCL 4 MG/2ML IJ SOLN
4.0000 mg | Freq: Once | INTRAMUSCULAR | Status: AC
  Administered 2015-04-05: 4 mg via INTRAVENOUS
  Filled 2015-04-05: qty 2

## 2015-04-05 MED ORDER — ENOXAPARIN SODIUM 40 MG/0.4ML ~~LOC~~ SOLN
40.0000 mg | SUBCUTANEOUS | Status: DC
  Administered 2015-04-05 – 2015-04-08 (×4): 40 mg via SUBCUTANEOUS
  Filled 2015-04-05 (×5): qty 0.4

## 2015-04-05 MED ORDER — INFLUENZA VAC SPLIT QUAD 0.5 ML IM SUSY
0.5000 mL | PREFILLED_SYRINGE | INTRAMUSCULAR | Status: AC
Start: 2015-04-06 — End: 2015-04-09
  Administered 2015-04-09: 0.5 mL via INTRAMUSCULAR
  Filled 2015-04-05 (×2): qty 0.5

## 2015-04-05 MED ORDER — ACETAMINOPHEN 325 MG PO TABS
650.0000 mg | ORAL_TABLET | Freq: Four times a day (QID) | ORAL | Status: DC | PRN
  Filled 2015-04-05: qty 2

## 2015-04-05 MED ORDER — DEXTROSE-NACL 5-0.45 % IV SOLN: INTRAVENOUS | Status: DC

## 2015-04-05 MED ORDER — HYDRALAZINE HCL 20 MG/ML IJ SOLN
5.0000 mg | Freq: Four times a day (QID) | INTRAMUSCULAR | Status: DC | PRN
Start: 2015-04-05 — End: 2015-04-09

## 2015-04-05 MED ORDER — SODIUM CHLORIDE 0.9 % IV SOLN
INTRAVENOUS | Status: DC
  Administered 2015-04-05: 3.2 [IU]/h via INTRAVENOUS
  Filled 2015-04-05: qty 2.5

## 2015-04-05 MED ORDER — DEXTROSE-NACL 5-0.45 % IV SOLN
INTRAVENOUS | Status: DC
  Administered 2015-04-05 – 2015-04-06 (×3): via INTRAVENOUS

## 2015-04-05 MED ORDER — POTASSIUM CHLORIDE 10 MEQ/100ML IV SOLN: 10.0000 meq | INTRAVENOUS | Status: DC

## 2015-04-05 MED ORDER — ONDANSETRON HCL 4 MG/2ML IJ SOLN
4.0000 mg | Freq: Four times a day (QID) | INTRAMUSCULAR | Status: DC | PRN
  Administered 2015-04-05 – 2015-04-07 (×5): 4 mg via INTRAVENOUS
  Filled 2015-04-05 (×4): qty 2

## 2015-04-05 MED ORDER — LORAZEPAM 2 MG/ML IJ SOLN
0.2500 mg | Freq: Three times a day (TID) | INTRAMUSCULAR | Status: DC | PRN
  Administered 2015-04-05 – 2015-04-06 (×3): 0.25 mg via INTRAVENOUS
  Filled 2015-04-05 (×3): qty 1

## 2015-04-05 MED ORDER — TRAMADOL HCL 50 MG PO TABS: 50.0000 mg | ORAL_TABLET | Freq: Four times a day (QID) | ORAL | Status: DC | PRN

## 2015-04-05 MED ORDER — HYDRALAZINE HCL 20 MG/ML IJ SOLN: 10.0000 mg | INTRAMUSCULAR | Status: DC | PRN

## 2015-04-05 MED ORDER — SODIUM CHLORIDE 0.9 % IV SOLN
INTRAVENOUS | Status: DC
Start: 2015-04-05 — End: 2015-04-05

## 2015-04-05 NOTE — H&P (Signed)
[facility] History and Physical  Zellie Jenning UDJ:[id number] DOB: Apr 16, 1966 DOA: 04/05/2015  Referring physician: Dr. Dina Rich. PCP: Barbette Merino, MD  Specialists: Follows up with [facility] gastroenterology.  Chief Complaint: Nausea vomiting.  HPI: [name] is a 49 y.o. female history of diabetes mellitus type 1 and gastroparesis was recently admitted for colitis last month presents to the ER because of multiple episodes of nausea and vomiting. She states over the last 2 days patient has been having multiple episodes of nausea vomiting with crampy abdominal pain typical of her gastroparesis exacerbation. Denies any diarrhea. Patient states she has taken her insulin as recommended. In the ER patient was found to be having elevated blood sugar with anion gap and tachycardic. Urine shows ketosis. Patient has been admitted for DKA and nausea vomiting most likely from patient's known history of diabetic ketoacidosis. Patient denies any chest pain fever chills shortness of breath. Patient has been having some anxiety. Patient states she was given Marinol by gastroenterologist at [facility] for gastroparesis but has not helped her much.  Review of Systems: As presented in the history of presenting illness, rest negative.  Past Medical History  Diagnosis Date  . Hypertension   . Cataract   . Anxiety   . Anemia   . GERD (gastroesophageal reflux disease)   . Constipation 04/03/2012  . Complication of anesthesia   . PONV (postoperative nausea and vomiting)   . Asthma     IN CHILDHOOD  . Neuromuscular disorder ([place])     Diabetic neuropathy  . Gastroparesis   . Diabetic neuropathy ([place])   . History of blood product transfusion   . Diabetes mellitus     diagnosed age 67   Past Surgical History  Procedure Laterality Date  . Retinal detachment surgery    . Posterior vitrectomy and membrane peel-left eye  09/28/2002  . Posterior vitrectomy and membrane peel-right eye  03/16/2002  . Eye  surgery    . Gailstones     Social History:  reports that she has never smoked. She has never used smokeless tobacco. She reports that she does not drink alcohol or use illicit drugs. Where does patient live home. Can patient participate in ADLs? Yes.  Allergies  Allergen Reactions  . Anesthetics, Amide Nausea And Vomiting  . Penicillins Diarrhea and Nausea And Vomiting    Has patient had a PCN reaction causing immediate rash, facial/tongue/throat swelling, SOB or lightheadedness with hypotension: No Has patient had a PCN reaction causing severe rash involving mucus membranes or skin necrosis: No Has patient had a PCN reaction that required hospitalization Yes  Has patient had a PCN reaction occurring within the last 10 years: Yes  If all of the above answers are "NO", then may proceed with Cephalosporin use.   . Buprenorphine Hcl Rash  . Encainide Nausea And Vomiting  . Metoclopramide Rash  . Morphine And Related Rash    Family History:  Family History  Problem Relation Age of Onset  . Cystic fibrosis Mother   . Hypertension Father   . Diabetes Brother   . Hypertension Maternal Grandmother       Prior to Admission medications   Medication Sig Start Date End Date Taking? Authorizing Provider  acetaminophen (TYLENOL) 325 MG tablet Take 650 mg by mouth every 6 (six) hours as needed for moderate pain (cramping).    Yes Historical Provider, MD  dronabinol (MARINOL) 10 MG capsule Take 10 mg by mouth 2 (two) times daily. 02/04/15  Yes Historical Provider, MD  ferrous sulfate 325 (65 FE) MG tablet Take 325 mg by mouth daily.    Yes Historical Provider, MD  insulin aspart (NOVOLOG) 100 UNIT/ML injection Inject 0-15 Units into the skin 3 (three) times daily with meals. 6-15 units three times daily with food.  Per sliding scale 03/12/15  Yes Belkys A Regalado, MD  insulin detemir (LEVEMIR) 100 UNIT/ML injection Inject 0.15 mLs (15 Units total) into the skin at bedtime. 02/17/13  Yes Robbie Lis, MD  LORazepam (ATIVAN) 0.5 MG tablet Take 1 tablet by mouth 3 (three) times daily as needed for anxiety.  11/29/14  Yes Historical Provider, MD  pantoprazole (PROTONIX) 40 MG tablet Take 40 mg by mouth 2 (two) times daily. 01/04/15  Yes Historical Provider, MD  pregabalin (LYRICA) 150 MG capsule Take 1 capsule (150 mg total) by mouth 2 (two) times daily. 02/17/13  Yes Robbie Lis, MD  ciprofloxacin (CIPRO) 500 MG tablet Take 1 tablet (500 mg total) by mouth 2 (two) times daily. Patient not taking: Reported on 03/13/2015 03/12/15   Belkys A Regalado, MD  metroNIDAZOLE (FLAGYL) 500 MG tablet Take 1 tablet (500 mg total) by mouth every 8 (eight) hours. Patient not taking: Reported on 03/13/2015 03/12/15   Belkys A Regalado, MD  ondansetron (ZOFRAN ODT) 4 MG disintegrating tablet Take 1 tablet (4 mg total) by mouth every 8 (eight) hours as needed for nausea or vomiting. Patient not taking: Reported on 04/04/2015 03/21/15   Nona Dell, PA-C  ondansetron [facility] ODT) 8 MG disintegrating tablet 8mg  ODT q8 hours prn nausea Patient not taking: Reported on 04/04/2015 04/04/15   [name], MD  ondansetron (ZOFRAN) 4 MG tablet Take 1 tablet (4 mg total) by mouth every 8 (eight) hours as needed for nausea or vomiting. Patient not taking: Reported on 03/13/2015 03/12/15   Elmarie Shiley, MD    Physical Exam: Filed Vitals:   04/05/15 0300 04/05/15 0315 04/05/15 0330 04/05/15 0345  BP: 148/69 172/71 162/73 149/64  Pulse: 119 123 121 119  Temp:      TempSrc:      Resp:      Height:      Weight:      SpO2: 100% 99% 100% 100%     General:  Moderately built and nourished.  Eyes: Anicteric no pallor.  ENT: No discharge from the ears eyes nose and mouth.  Neck: No mass felt.  Cardiovascular: S1-S2 heard.  Respiratory: No rhonchi or crepitations.  Abdomen: Soft nontender bowel sounds present.  Skin: No rash.  Musculoskeletal: No edema.  Psychiatric: Appears  normal.  Neurologic: Alert awake oriented to time place and person. Moves all extremities.  Labs on Admission:  Basic Metabolic Panel:  Recent Labs Lab 03/30/15 1532 04/04/15 0400 04/05/15 0238 04/05/15 0240  NA 137 137 138 137  K 4.3 3.8 4.1 4.1  CL 99* 101 104 104  CO2 27  --   --  16*  GLUCOSE 140* 145* 468* 429*  BUN 10 5* 10 9  CREATININE 0.97 0.90 0.70 1.18*  CALCIUM 9.5  --   --  8.7*   Liver Function Tests:  Recent Labs Lab 03/30/15 1532  AST 34  ALT 23  ALKPHOS 84  BILITOT 1.3*  PROT 7.9  ALBUMIN 3.8    Recent Labs Lab 03/30/15 1532  LIPASE 44   No results for input(s): AMMONIA in the last 168 hours. CBC:  Recent Labs Lab 03/30/15 1532 04/04/15 0400 04/05/15 0233 04/05/15 9528  WBC 6.3  --  6.7  --   NEUTROABS  --   --  5.7  --   HGB 12.3 13.9 10.4* 11.2*  HCT 37.3 41.0 32.7* 33.0*  MCV 90.1  --  91.1  --   PLT 429*  --  281  --    Cardiac Enzymes: No results for input(s): CKTOTAL, CKMB, CKMBINDEX, TROPONINI in the last 168 hours.  BNP (last 3 results) No results for input(s): BNP in the last 8760 hours.  ProBNP (last 3 results) No results for input(s): PROBNP in the last 8760 hours.  CBG:  Recent Labs Lab 03/30/15 1456 03/30/15 1525 04/04/15 0607 04/04/15 2224 04/05/15 0303  GLUCAP 109* 131* 175* 199* 382*    Radiological Exams on Admission: Dg Abd Acute W/chest  04/04/2015   CLINICAL DATA:  Nausea and vomiting beginning 3 hours ago. Constipation for 5 days. History of gastroparesis.  EXAM: DG ABDOMEN ACUTE W/ 1V CHEST  COMPARISON:  03/21/2015  FINDINGS: Normal heart size and pulmonary vascularity. No focal airspace disease or consolidation in the lungs. No blunting of costophrenic angles. No pneumothorax. Mediastinal contours appear intact.  Scattered gas and stool in the colon. No small or large bowel distention. No free intra-abdominal air. No abnormal air-fluid levels. No radiopaque stones. Visualized bones appear intact.  Surgical clips in the right upper quadrant.  IMPRESSION: No evidence of active pulmonary disease. Normal nonobstructive bowel gas pattern. No gastric distention.   Electronically Signed   By: Lucienne Capers M.D.   On: 04/04/2015 05:15     Assessment/Plan Principal Problem:   DKA (diabetic ketoacidoses) (HCC) Active Problems:   Nausea and vomiting   Diabetic gastroparesis (HCC)   Gastroparesis   1. Diabetic ketoacidosis - probably preceded by dehydration and persistent nausea vomiting. Continue with aggressive IV fluid hydration and IV insulin infusion until anion gap is corrected. Following which patient will be restarted on her long-acting insulin. 2. Nausea vomiting most likely from diabetic gastroparesis - At this time patient is nothing by mouth and when necessary IV antiemetics. Continue hydration and correction of DKA. 3. Elevated blood pressure - probably from stress and vomiting. Closely observe blood pressure trends. Patient is on when necessary IV hydralazine for now. 4. History of anxiety - usually takes Ativan and since patient is nothing by mouth I have placed patient on when necessary IV Ativan 0.25 mg every 8 when necessary. 5. Iron deficiency anemia - continue ferrous sulfate once patient can take orally.  I have reviewed patient's old charts and labs. Personally reviewed the patient's x-rays.   DVT Prophylaxis Lovenox.  Code Status: Full code.  Family Communication: Discussed with patient.  Disposition Plan: Admit to inpatient.    Kaydan Wilhoite N. [facility] Pager [phone].  If 7PM-7AM, please contact night-coverage [email or web address] Password [email or web address] 04/05/2015, 5:45 AM

## 2015-04-05 NOTE — Progress Notes (Addendum)
UR COMPLETED  

## 2015-04-05 NOTE — Care Management Note (Addendum)
Case Management Note  Patient Details  Name: [name] MRN: [id number] Date of Birth: 04-Apr-1966  Subjective/Objective:                 From home admitted with DKA. Lives with sister, independent with ADL's.   Action/Plan:  Retrn to home whemn medically stable. CM to f/u with d/c needs. Expected Discharge Date:  04/07/15               Expected Discharge Plan:  Home/Self Care  In-House Referral:     Discharge planning Services  CM Consult  Post Acute Care Choice:    Choice offered to:     DME Arranged:    DME Agency:     HH Arranged:    HH Agency:     Status of Service:  In process, will continue to follow  Medicare Important Message Given:    Date Medicare IM Given:    Medicare IM give by:    Date Additional Medicare IM Given:    Additional Medicare Important Message give by:     If discussed at [place] of Stay Meetings, dates discussed:    Additional Comments:  [name], [place] [phone] 04/05/2015, 2:43 PM

## 2015-04-05 NOTE — Progress Notes (Signed)
Inpatient Diabetes Program Recommendations  AACE/ADA: New Consensus Statement on Inpatient Glycemic Control (2015)  Target Ranges:  Prepandial:   less than 140 mg/dL      Peak postprandial:   less than 180 mg/dL (1-2 hours)      Critically ill patients:  140 - 180 mg/dL    Results for INZA, MIKRUT (MRN [id number]) as of 04/05/2015 11:37  Ref. Range 04/05/2015 02:40  Sodium Latest Ref Range: 135-145 mmol/L 137  Potassium Latest Ref Range: 3.5-5.1 mmol/L 4.1  Chloride Latest Ref Range: 101-111 mmol/L 104  CO2 Latest Ref Range: 22-32 mmol/L 16 (L)  BUN Latest Ref Range: 6-20 mg/dL 9  Creatinine Latest Ref Range: 0.44-1.00 mg/dL 1.18 (H)  Calcium Latest Ref Range: 8.9-10.3 mg/dL 8.7 (L)  EGFR (Non-African Amer.) Latest Ref Range: >60 mL/min 54 (L)  EGFR (African American) Latest Ref Range: >60 mL/min >60  Glucose Latest Ref Range: 65-99 mg/dL 429 (H)  Anion gap Latest Ref Range: 5-15  17 (H)    Admit with: DKA/ Gastroparesis  History: Type 1 DM, Gastroparesis  Home DM Meds: Levemir 15 units QHS        Novolog SSI  Current Insulin Orders: IV Insulin drip per DKA Order set     -Note per 8am BMET that acidosis has not cleared for patient yet.  CO2 only up to 12 and Anion Gap still at 20 this morning.  -Per RN, patient currently has D5 1/2 NS running at 150 cc/hour.  -I spoke with this patient back on 02/10/15 when she was admitted to [facility] hospital for DKA.  Patient's son told me she was planning to seek care under a GI Specialist and an Endocrinologist with [facility].  Not sure if patient ever followed through with this plan.    MD- When patient's labs have improved and her acidosis has cleared, please transition patient to the following:  1. Levemir 15 units daily- Give Levemir 1-2 hours before IV insulin drip stopped  2. Start Novolog Sensitive SSI at time of d/c of IV insulin drip (may want to order Q4 hours if PO intake poor)      ----Will follow patient  during hospitalization----  Wyn Quaker RN, MSN, CDE Diabetes Coordinator Inpatient Glycemic Control Team Team Pager: [phone] (8a-5p)

## 2015-04-05 NOTE — Progress Notes (Addendum)
PROGRESS NOTE    [name] EYC:[id number] DOB: [birth year] DOA: 04/05/2015 PCP: Barbette Merino, MD  HPI/Brief narrative 49 year old female patient with history of DM 1 since age 69 years with gastroparesis and neuropathy, HTN, anxiety, anemia, GERD, hospitalized multiple times, 8/16-8/21 for DKA, again from 8/27-8/30 for nausea and vomiting, 9/13-9/17 for gastroparesis flare and possible colitis, followed at [place], admitted on 04/05/15 with 2 day history of nausea, nonbloody emesis, abdominal pain typical of her prior gastroparesis exacerbations. No diarrhea reported. Claimed compliance with her insulin regimen. In ED, noted to have DKA and admitted to stepdown unit for further management. Patient stated that Marinol given by her GI at [facility] for gastroparesis has not helped much.   Assessment/Plan:  DKA in type I DM, not at goal - Continue aggressive IV fluid hydration and insulin infusion per DKA protocol. Monitor BMP frequently. Once her DKA has resolved and CBGs are in target range, transitioned to Lantus and SSI. - Likely precipitated by GI losses - A1c 02/20/15:8.9 suggesting poor control  Intractable nausea, vomiting and abdominal pain - Likely secondary to diabetic gastroparesis - Apparently allergic to Reglan. Continue supportive treatment with bowel rest, IV PPI and antiemetics. IV fluids. - Advance diet gradually after DKA resolved. Minimize opioid pain medications. - Urine pregnancy test: Negative  Dehydration - Secondary to GI losses. IV fluids  Acute kidney injury - Secondary to dehydration. IV fluids.  Hypokalemia - Replace and follow  GERD - IV PPI  Essential hypertension - When necessary IV hydralazine  Anxiety - When necessary benzodiazepines.  Anemia - Possibly chronic. Stable. Follow CBCs  Diabetic peripheral neuropathy - Continue Lyrica   DVT prophylaxis: Lovenox Code Status: Full Family Communication: None at  bedside Disposition Plan: Continue treatment in stepdown unit for additional 24 hours. DC home when medically stable, possibly in 3-4 days.   Consultants:  None  Procedures:  None  Antibiotics:  None   Subjective: As per patient and RN, 1 episode of nonbloody emesis since arrival to floor. Nausea +. Abdominal pain +. Had normal BM yesterday. No chest pain or dyspnea.  Objective: Filed Vitals:   04/05/15 0330 04/05/15 0345 04/05/15 0508 04/05/15 0746  BP: 162/73 149/64 152/79 168/70  Pulse: 121 119 118 117  Temp:   97.9 F (36.6 C) 97.5 F (36.4 C)  TempSrc:   Oral Oral  Resp:   20 29  Height:      Weight:      SpO2: 100% 100% 100% 100%    Intake/Output Summary (Last 24 hours) at 04/05/15 1058 Last data filed at 04/05/15 1497  Gross per 24 hour  Intake   17.3 ml  Output    250 ml  Net -232.7 ml   Filed Weights   04/04/15 2225  Weight: 63.05 kg (139 lb)     Exam:  General exam: Pleasant young female lying comfortably supine in bed. Oral mucosa dry. Respiratory system: Clear. No increased work of breathing. Cardiovascular system: S1 & S2 heard, RRR. No JVD, murmurs, gallops, clicks or pedal edema. Telemetry: Sinus tachycardia in the 120s. Gastrointestinal system: Abdomen is nondistended, soft. Diffuse mild tenderness without peritoneal signs. Normal bowel sounds heard. Central nervous system: Alert and oriented. No focal neurological deficits. Extremities: Symmetric 5 x 5 power.   Data Reviewed: Basic Metabolic Panel:  Recent Labs Lab 03/30/15 1532 04/04/15 0400 04/05/15 0238 04/05/15 0240 04/05/15 0808  NA 137 137 138 137 140  K 4.3 3.8 4.1 4.1 3.4*  CL 99* 101  104 104 108  CO2 27  --   --  16* 12*  GLUCOSE 140* 145* 468* 429* 224*  BUN 10 5* 10 9 9   CREATININE 0.97 0.90 0.70 1.18* 1.15*  CALCIUM 9.5  --   --  8.7* 9.0   Liver Function Tests:  Recent Labs Lab 03/30/15 1532  AST 34  ALT 23  ALKPHOS 84  BILITOT 1.3*  PROT 7.9  ALBUMIN  3.8    Recent Labs Lab 03/30/15 1532  LIPASE 44   No results for input(s): AMMONIA in the last 168 hours. CBC:  Recent Labs Lab 03/30/15 1532 04/04/15 0400 04/05/15 0233 04/05/15 0238 04/05/15 0808  WBC 6.3  --  6.7  --  9.7  NEUTROABS  --   --  5.7  --  9.2*  HGB 12.3 13.9 10.4* 11.2* 11.2*  HCT 37.3 41.0 32.7* 33.0* 34.3*  MCV 90.1  --  91.1  --  90.0  PLT 429*  --  281  --  337   Cardiac Enzymes:  Recent Labs Lab 04/05/15 0808  TROPONINI <0.03   BNP (last 3 results) No results for input(s): PROBNP in the last 8760 hours. CBG:  Recent Labs Lab 04/05/15 0303 04/05/15 0458 04/05/15 0603 04/05/15 0714 04/05/15 0819  GLUCAP 382* 382* 317* 193* 205*    Recent Results (from the past 240 hour(s))  MRSA PCR Screening     Status: None   Collection Time: 04/05/15  5:18 AM  Result Value Ref Range Status   MRSA by PCR NEGATIVE NEGATIVE Final    Comment:        The GeneXpert MRSA Assay (FDA approved for NASAL specimens only), is one component of a comprehensive MRSA colonization surveillance program. It is not intended to diagnose MRSA infection nor to guide or monitor treatment for MRSA infections.            Studies: Dg Abd Acute W/chest  04/04/2015   CLINICAL DATA:  Nausea and vomiting beginning 3 hours ago. Constipation for 5 days. History of gastroparesis.  EXAM: DG ABDOMEN ACUTE W/ 1V CHEST  COMPARISON:  03/21/2015  FINDINGS: Normal heart size and pulmonary vascularity. No focal airspace disease or consolidation in the lungs. No blunting of costophrenic angles. No pneumothorax. Mediastinal contours appear intact.  Scattered gas and stool in the colon. No small or large bowel distention. No free intra-abdominal air. No abnormal air-fluid levels. No radiopaque stones. Visualized bones appear intact. Surgical clips in the right upper quadrant.  IMPRESSION: No evidence of active pulmonary disease. Normal nonobstructive bowel gas pattern. No gastric  distention.   Electronically Signed   By: Lucienne Capers M.D.   On: 04/04/2015 05:15        Scheduled Meds: . enoxaparin (LOVENOX) injection  40 mg Subcutaneous Q24H  . [START ON 04/06/2015] Influenza vac split quadrivalent PF  0.5 mL Intramuscular Tomorrow-1000  . pantoprazole (PROTONIX) IV  40 mg Intravenous Q12H   Continuous Infusions: . sodium chloride    . dextrose 5 % and 0.45% NaCl    . insulin (NOVOLIN-R) infusion 4.8 Units/hr (04/05/15 1013)    Principal Problem:   DKA (diabetic ketoacidoses) ([place]) Active Problems:   Nausea and vomiting   Diabetic gastroparesis (HCC)   Gastroparesis    Time spent: 45 minutes.    Vernell Leep, MD, FACP, FHM. [facility] Pager [phone]  If 7PM-7AM, please contact night-coverage [email or web address] Password [email or web address] 04/05/2015, 10:58 AM    LOS: 0 days

## 2015-04-05 NOTE — ED Provider Notes (Signed)
CSN: [id number]     Arrival date & time 04/04/15  2215 History   First MD Initiated Contact with Patient 04/05/15 0128     Chief Complaint  Patient presents with  . Emesis     (Consider location/radiation/quality/duration/timing/severity/associated sxs/prior Treatment) HPI  This a 49 year old female with a history of diabetes, gastroparesis who presents with nausea and vomiting. Was seen and evaluated earlier today for the same. She was given Zofran and Haldol and discharged home. Patient reports that she has not had any relief of her symptoms. She reports persistent nausea. She is unable to keep her medications down. She takes Zofran at home for her nausea. She denies any abdominal pain. Per the patient's son, she has "bad gastroparesis." She has had multiple admissions. Most recent admission 03/12/15.  Past Medical History  Diagnosis Date  . Hypertension   . Cataract   . Anxiety   . Anemia   . GERD (gastroesophageal reflux disease)   . Constipation 04/03/2012  . Complication of anesthesia   . PONV (postoperative nausea and vomiting)   . Asthma     IN CHILDHOOD  . Neuromuscular disorder ([place])     Diabetic neuropathy  . Gastroparesis   . Diabetic neuropathy ([place])   . History of blood product transfusion   . Diabetes mellitus     diagnosed age 77   Past Surgical History  Procedure Laterality Date  . Retinal detachment surgery    . Posterior vitrectomy and membrane peel-left eye  09/28/2002  . Posterior vitrectomy and membrane peel-right eye  03/16/2002  . Eye surgery    . Gailstones     Family History  Problem Relation Age of Onset  . Cystic fibrosis Mother   . Hypertension Father   . Diabetes Brother   . Hypertension Maternal Grandmother    Social History  Substance Use Topics  . Smoking status: Never Smoker   . Smokeless tobacco: Never Used  . Alcohol Use: No   OB History    No data available     Review of Systems  Constitutional: Negative for fever.   Respiratory: Negative for cough, chest tightness and shortness of breath.   Cardiovascular: Negative for chest pain.  Gastrointestinal: Positive for nausea and vomiting. Negative for abdominal pain, diarrhea and constipation.  Genitourinary: Negative for dysuria.  Skin: Negative for wound.  All other systems reviewed and are negative.     Allergies  Anesthetics, amide; Penicillins; Buprenorphine hcl; Encainide; Metoclopramide; and Morphine and related  Home Medications   Prior to Admission medications   Medication Sig Start Date End Date Taking? Authorizing Provider  acetaminophen (TYLENOL) 325 MG tablet Take 650 mg by mouth every 6 (six) hours as needed for moderate pain (cramping).    Yes Historical Provider, MD  dronabinol (MARINOL) 10 MG capsule Take 10 mg by mouth 2 (two) times daily. 02/04/15  Yes Historical Provider, MD  ferrous sulfate 325 (65 FE) MG tablet Take 325 mg by mouth daily.    Yes Historical Provider, MD  insulin aspart (NOVOLOG) 100 UNIT/ML injection Inject 0-15 Units into the skin 3 (three) times daily with meals. 6-15 units three times daily with food.  Per sliding scale 03/12/15  Yes Belkys A Regalado, MD  insulin detemir (LEVEMIR) 100 UNIT/ML injection Inject 0.15 mLs (15 Units total) into the skin at bedtime. 02/17/13  Yes Robbie Lis, MD  LORazepam (ATIVAN) 0.5 MG tablet Take 1 tablet by mouth 3 (three) times daily as needed for anxiety.  11/29/14  Yes Historical Provider, MD  pantoprazole (PROTONIX) 40 MG tablet Take 40 mg by mouth 2 (two) times daily. 01/04/15  Yes Historical Provider, MD  pregabalin (LYRICA) 150 MG capsule Take 1 capsule (150 mg total) by mouth 2 (two) times daily. 02/17/13  Yes Robbie Lis, MD  ciprofloxacin (CIPRO) 500 MG tablet Take 1 tablet (500 mg total) by mouth 2 (two) times daily. Patient not taking: Reported on 03/13/2015 03/12/15   Belkys A Regalado, MD  metroNIDAZOLE (FLAGYL) 500 MG tablet Take 1 tablet (500 mg total) by mouth every 8  (eight) hours. Patient not taking: Reported on 03/13/2015 03/12/15   Belkys A Regalado, MD  ondansetron (ZOFRAN ODT) 4 MG disintegrating tablet Take 1 tablet (4 mg total) by mouth every 8 (eight) hours as needed for nausea or vomiting. Patient not taking: Reported on 04/04/2015 03/21/15   Nona Dell, PA-C  ondansetron [facility] ODT) 8 MG disintegrating tablet 8mg  ODT q8 hours prn nausea Patient not taking: Reported on 04/04/2015 04/04/15   [name], MD  ondansetron (ZOFRAN) 4 MG tablet Take 1 tablet (4 mg total) by mouth every 8 (eight) hours as needed for nausea or vomiting. Patient not taking: Reported on 03/13/2015 03/12/15   Belkys A Regalado, MD   BP 138/110 mmHg  Pulse 116  Temp(Src) 97.9 F (36.6 C) (Oral)  Resp 30  Ht 5\' 2"  (1.575 m)  Wt 139 lb (63.05 kg)  BMI 25.42 kg/m2  SpO2 100%  LMP 03/11/2015 Physical Exam  Constitutional: She is oriented to person, place, and time. No distress.  Chronically ill-appearing, spitting in emesis bag  HENT:  Head: Normocephalic and atraumatic.  Mucous membranes dry  Eyes: Pupils are equal, round, and reactive to light.  Cardiovascular: Regular rhythm and normal heart sounds.   No murmur heard. Tachycardia  Pulmonary/Chest: Effort normal and breath sounds normal. No respiratory distress. She has no wheezes.  Abdominal: Soft. Bowel sounds are normal. There is no tenderness. There is no rebound and no guarding.  Neurological: She is alert and oriented to person, place, and time.  Skin: Skin is warm and dry.  Psychiatric: She has a normal mood and affect.  Nursing note and vitals reviewed.   ED Course  Procedures (including critical care time)  CRITICAL CARE Performed by: Merryl Hacker   Total critical care time: 35 min  Critical care time was exclusive of separately billable procedures and treating other patients.  Critical care was necessary to treat or prevent imminent or life-threatening  deterioration.  Critical care was time spent personally by me on the following activities: development of treatment plan with patient and/or surrogate as well as nursing, discussions with consultants, evaluation of patient's response to treatment, examination of patient, obtaining history from patient or surrogate, ordering and performing treatments and interventions, ordering and review of laboratory studies, ordering and review of radiographic studies, pulse oximetry and re-evaluation of patient's condition.  Labs Review Labs Reviewed  URINALYSIS, ROUTINE W REFLEX MICROSCOPIC (NOT AT [facility]) - Abnormal; Notable for the following:    APPearance CLOUDY (*)    Glucose, UA >1000 (*)    Ketones, ur >80 (*)    All other components within normal limits  CBC WITH DIFFERENTIAL/PLATELET - Abnormal; Notable for the following:    RBC 3.59 (*)    Hemoglobin 10.4 (*)    HCT 32.7 (*)    All other components within normal limits  URINE MICROSCOPIC-ADD ON - Abnormal; Notable for the following:    Squamous  Epithelial / LPF MANY (*)    Bacteria, UA FEW (*)    All other components within normal limits  CBG MONITORING, ED - Abnormal; Notable for the following:    Glucose-Capillary 199 (*)    All other components within normal limits  I-STAT CHEM 8, ED - Abnormal; Notable for the following:    Glucose, Bld 468 (*)    Calcium, Ion 1.10 (*)    Hemoglobin 11.2 (*)    HCT 33.0 (*)    All other components within normal limits  CBG MONITORING, ED - Abnormal; Notable for the following:    Glucose-Capillary 382 (*)    All other components within normal limits  BASIC METABOLIC PANEL    Imaging Review Dg Abd Acute W/chest  04/04/2015   CLINICAL DATA:  Nausea and vomiting beginning 3 hours ago. Constipation for 5 days. History of gastroparesis.  EXAM: DG ABDOMEN ACUTE W/ 1V CHEST  COMPARISON:  03/21/2015  FINDINGS: Normal heart size and pulmonary vascularity. No focal airspace disease or consolidation in the  lungs. No blunting of costophrenic angles. No pneumothorax. Mediastinal contours appear intact.  Scattered gas and stool in the colon. No small or large bowel distention. No free intra-abdominal air. No abnormal air-fluid levels. No radiopaque stones. Visualized bones appear intact. Surgical clips in the right upper quadrant.  IMPRESSION: No evidence of active pulmonary disease. Normal nonobstructive bowel gas pattern. No gastric distention.   Electronically Signed   By: Lucienne Capers M.D.   On: 04/04/2015 05:15   I have personally reviewed and evaluated these images and lab results as part of my medical decision-making.   EKG Interpretation None      MDM   Final diagnoses:  Gastroparesis  Dehydration  Diabetic ketoacidosis without coma associated with other specified diabetes mellitus ([place])    Patient presents with persistent nausea and vomiting. Nontoxic on exam. Mildly tachycardic with a heart rate in the 110s.  Patient given fluids, Zofran, and Haldol. Initial lab work notable for hyperglycemia with glucose of 468. Anion gap is 19. She has greater than 100 glucose in her urine and greater than 80 ketones. She is obviously dehydrated but may also be in diabetic ketoacidosis. Patient was placed on the glucose stabilizer and will be admitted. Patient started on 125 per hour normal saline. She is status post 2 L of fluid.    Merryl Hacker, MD 04/05/15 [phone]

## 2015-04-05 NOTE — Progress Notes (Signed)
Data: patient admitted from ER at [place]. Patient alert and oriented and has generalized weakness. C/o of persistent nausea and now abdominal pain. Insulin infusion ongoing.  Action: oriented patient to room, floor, and plan of care. Placed needed items within reach. Notified HO of arrival  Response: patient safe still with c/o of nausea.

## 2015-04-06 DIAGNOSIS — E1143 Type 2 diabetes mellitus with diabetic autonomic (poly)neuropathy: Secondary | ICD-10-CM

## 2015-04-06 DIAGNOSIS — K3184 Gastroparesis: Secondary | ICD-10-CM

## 2015-04-06 DIAGNOSIS — E1010 Type 1 diabetes mellitus with ketoacidosis without coma: Principal | ICD-10-CM

## 2015-04-06 DIAGNOSIS — R112 Nausea with vomiting, unspecified: Secondary | ICD-10-CM

## 2015-04-06 LAB — BASIC METABOLIC PANEL
ANION GAP: 12 (ref 5–15)
ANION GAP: 15 (ref 5–15)
ANION GAP: 10 (ref 5–15)
ANION GAP: 14 (ref 5–15)
Anion gap: 13 (ref 5–15)
BUN: <5 mg/dL — ABNORMAL LOW (ref 6–20)
BUN: <5 mg/dL — ABNORMAL LOW (ref 6–20)
BUN: <5 mg/dL — ABNORMAL LOW (ref 6–20)
CALCIUM: 8.7 mg/dL — AB (ref 8.9–10.3)
CHLORIDE: 106 mmol/L (ref 101–111)
CHLORIDE: 104 mmol/L (ref 101–111)
CHLORIDE: 101 mmol/L (ref 101–111)
CHLORIDE: 103 mmol/L (ref 101–111)
CO2: 16 mmol/L — ABNORMAL LOW (ref 22–32)
CO2: 19 mmol/L — ABNORMAL LOW (ref 22–32)
CO2: 20 mmol/L — AB (ref 22–32)
CO2: 22 mmol/L (ref 22–32)
CO2: 19 mmol/L — AB (ref 22–32)
Calcium: 8.4 mg/dL — ABNORMAL LOW (ref 8.9–10.3)
Calcium: 9 mg/dL (ref 8.9–10.3)
Calcium: 8.6 mg/dL — ABNORMAL LOW (ref 8.9–10.3)
Calcium: 8.4 mg/dL — ABNORMAL LOW (ref 8.9–10.3)
Chloride: 101 mmol/L (ref 101–111)
Creatinine, Ser: 0.81 mg/dL (ref 0.44–1.00)
Creatinine, Ser: 0.87 mg/dL (ref 0.44–1.00)
Creatinine, Ser: 0.74 mg/dL (ref 0.44–1.00)
Creatinine, Ser: 0.78 mg/dL (ref 0.44–1.00)
Creatinine, Ser: 0.82 mg/dL (ref 0.44–1.00)
GFR calc Af Amer: >60 mL/min (ref >60)
GFR calc Af Amer: >60 mL/min (ref >60)
GFR calc non Af Amer: >60 mL/min (ref >60)
GFR calc non Af Amer: >60 mL/min (ref >60)
GLUCOSE: 214 mg/dL — AB (ref 65–99)
Glucose, Bld: 182 mg/dL — ABNORMAL HIGH (ref 65–99)
Glucose, Bld: 120 mg/dL — ABNORMAL HIGH (ref 65–99)
Glucose, Bld: 138 mg/dL — ABNORMAL HIGH (ref 65–99)
Glucose, Bld: 292 mg/dL — ABNORMAL HIGH (ref 65–99)
POTASSIUM: 3.9 mmol/L (ref 3.5–5.1)
POTASSIUM: 3.9 mmol/L (ref 3.5–5.1)
POTASSIUM: 3.5 mmol/L (ref 3.5–5.1)
Potassium: 3.2 mmol/L — ABNORMAL LOW (ref 3.5–5.1)
Potassium: 3.1 mmol/L — ABNORMAL LOW (ref 3.5–5.1)
SODIUM: 134 mmol/L — AB (ref 135–145)
SODIUM: 138 mmol/L (ref 135–145)
Sodium: 134 mmol/L — ABNORMAL LOW (ref 135–145)
Sodium: 133 mmol/L — ABNORMAL LOW (ref 135–145)
Sodium: 136 mmol/L (ref 135–145)

## 2015-04-06 LAB — GLUCOSE, CAPILLARY
GLUCOSE-CAPILLARY: 131 mg/dL — AB (ref 65–99)
GLUCOSE-CAPILLARY: 117 mg/dL — AB (ref 65–99)
GLUCOSE-CAPILLARY: 132 mg/dL — AB (ref 65–99)
GLUCOSE-CAPILLARY: 153 mg/dL — AB (ref 65–99)
GLUCOSE-CAPILLARY: 159 mg/dL — AB (ref 65–99)
GLUCOSE-CAPILLARY: 142 mg/dL — AB (ref 65–99)
GLUCOSE-CAPILLARY: 154 mg/dL — AB (ref 65–99)
GLUCOSE-CAPILLARY: 164 mg/dL — AB (ref 65–99)
GLUCOSE-CAPILLARY: 171 mg/dL — AB (ref 65–99)
Glucose-Capillary: 160 mg/dL — ABNORMAL HIGH (ref 65–99)
Glucose-Capillary: 181 mg/dL — ABNORMAL HIGH (ref 65–99)
Glucose-Capillary: 191 mg/dL — ABNORMAL HIGH (ref 65–99)
Glucose-Capillary: 127 mg/dL — ABNORMAL HIGH (ref 65–99)
Glucose-Capillary: 122 mg/dL — ABNORMAL HIGH (ref 65–99)
Glucose-Capillary: 225 mg/dL — ABNORMAL HIGH (ref 65–99)
Glucose-Capillary: 182 mg/dL — ABNORMAL HIGH (ref 65–99)
Glucose-Capillary: 141 mg/dL — ABNORMAL HIGH (ref 65–99)

## 2015-04-06 LAB — CBC
HEMATOCRIT: 35.5 % — AB (ref 36.0–46.0)
Hemoglobin: 11.7 g/dL — ABNORMAL LOW (ref 12.0–15.0)
MCH: 29.4 pg (ref 26.0–34.0)
MCHC: 33 g/dL (ref 30.0–36.0)
MCV: 89.2 fL (ref 78.0–100.0)
PLATELETS: 366 10*3/uL (ref 150–400)
RBC: 3.98 MIL/uL (ref 3.87–5.11)
RDW: 15.1 % (ref 11.5–15.5)
WBC: 10.5 10*3/uL (ref 4.0–10.5)

## 2015-04-06 MED ORDER — ONDANSETRON 4 MG PO TBDP
4.0000 mg | ORAL_TABLET | Freq: Three times a day (TID) | ORAL | Status: DC | PRN
  Administered 2015-04-07 (×2): 4 mg via ORAL
  Filled 2015-04-06 (×4): qty 1

## 2015-04-06 MED ORDER — HYDROMORPHONE HCL 2 MG PO TABS
1.0000 mg | ORAL_TABLET | ORAL | Status: DC | PRN
  Administered 2015-04-06 – 2015-04-07 (×3): 1 mg via ORAL
  Filled 2015-04-06 (×4): qty 1

## 2015-04-06 MED ORDER — PANTOPRAZOLE SODIUM 40 MG PO TBEC
40.0000 mg | DELAYED_RELEASE_TABLET | Freq: Two times a day (BID) | ORAL | Status: DC
  Administered 2015-04-07 – 2015-04-09 (×6): 40 mg via ORAL
  Filled 2015-04-06 (×5): qty 1

## 2015-04-06 MED ORDER — LORAZEPAM 0.5 MG PO TABS
0.5000 mg | ORAL_TABLET | Freq: Three times a day (TID) | ORAL | Status: DC
  Administered 2015-04-06 – 2015-04-09 (×10): 0.5 mg via ORAL
  Filled 2015-04-06 (×10): qty 1

## 2015-04-06 MED ORDER — POTASSIUM CHLORIDE CRYS ER 20 MEQ PO TBCR
20.0000 meq | EXTENDED_RELEASE_TABLET | Freq: Once | ORAL | Status: DC
  Filled 2015-04-06: qty 1

## 2015-04-06 MED ORDER — INSULIN ASPART 100 UNIT/ML ~~LOC~~ SOLN: 3.0000 [IU] | Freq: Three times a day (TID) | SUBCUTANEOUS | Status: DC

## 2015-04-06 MED ORDER — LORAZEPAM 0.5 MG PO TABS: 0.5000 mg | ORAL_TABLET | Freq: Three times a day (TID) | ORAL | Status: DC | PRN

## 2015-04-06 MED ORDER — INSULIN ASPART 100 UNIT/ML ~~LOC~~ SOLN
0.0000 [IU] | Freq: Three times a day (TID) | SUBCUTANEOUS | Status: DC
  Administered 2015-04-06: 2 [IU] via SUBCUTANEOUS

## 2015-04-06 MED ORDER — INSULIN ASPART 100 UNIT/ML ~~LOC~~ SOLN: 0.0000 [IU] | Freq: Three times a day (TID) | SUBCUTANEOUS | Status: DC

## 2015-04-06 MED ORDER — INSULIN GLARGINE 100 UNIT/ML ~~LOC~~ SOLN
10.0000 [IU] | Freq: Every day | SUBCUTANEOUS | Status: DC
  Administered 2015-04-07 – 2015-04-08 (×3): 10 [IU] via SUBCUTANEOUS
  Filled 2015-04-06 (×5): qty 0.1

## 2015-04-06 MED ORDER — INSULIN ASPART 100 UNIT/ML ~~LOC~~ SOLN
3.0000 [IU] | Freq: Three times a day (TID) | SUBCUTANEOUS | Status: DC
  Administered 2015-04-07 – 2015-04-08 (×2): 3 [IU] via SUBCUTANEOUS

## 2015-04-06 NOTE — Progress Notes (Signed)
Pt IV assessed and found to no longer be functioning. IV team called and attempted X2 with no success, recommendation of PICC line placement. Dr. Verlon Au notified patient's PIV was no longer working, did not want to order PICC line. Wrote orders for subcutaneous insulin. Pt and family concerned about dehydration due to continual vomiting. Will continue to monitor.

## 2015-04-06 NOTE — Progress Notes (Signed)
PROGRESS NOTE    [name] SNK:[id number] DOB: 12/22/1965 DOA: 04/05/2015 PCP: Barbette Merino, MD  HPI/Brief narrative 49-year-ol ?  DM 1 since age 49 years with gastroparesis and neuropathy,  HTN,  anxiety,  anemia, G ERD,  hospitalized multiple times, 8/16-8/21 for DKA,  8/27-8/30 for nausea and vomiting,  9/13-9/17 for gastroparesis flare and possible colitis,  followed at [place],   admitted on 04/05/15 with 2 day history of nausea, nonbloody emesis, abdominal pain typical of her prior gastroparesis exacerbations. No diarrhea reported. Claimed compliance with her insulin regimen  . In ED, noted to have DKA and admitted to stepdown unit for further management. Patient stated that Marinol given by her GI at [facility] for gastroparesis has not helped much.   Assessment/Plan:  DKA in type I DM, not at goal, complicated with Gastroparesis/Neuropathy - Continue aggressive IV fluid hydration and insulin infusion per DKA protocol.  -Monitor BMP frequently. Once her DKA has resolved and CBGs are in target range,transitioned to Lantus and SSI. - Likely precipitated by GI losses and loss of gastric pH and h from gastric juices - A1c 02/20/15:--8.9 suggesting poor control -will be difficult to get her Co2 and Gap in range because of on going vomit and N -nursing instructed to start transition if next lab values look reasonable  Intractable nausea, vomiting and abdominal pain - Likely secondary to diabetic gastroparesis - Apparently allergic to Reglan. - Continue supportive treatment with bowel rest, IV PPI and antiemetics. IV fluids. - Advance diet gradually after DKA resolved. Minimize opioid pain medications-I have clearly explained to her we will not escaalte her pain meds and will use Tramadol as 1st choice - Urine pregnancy test: Negative -continue Phenergan 12.5 q6 prn -continue Marinol 10 mg bid when able to tol PO -difficult situation-she was refferred by GI  here in [place] to [facility] and has intolerances to Reglan [slurred sp[eech] and phenergan [drowsy].  Has trialed EEC in the past  Dehydration - Secondary to GI losses. IV fluids to continue  Acute kidney injury - initial BUN/Creat 9/1.18-->5/0.87 -Continue IV fluids 150 cc/hr D%-->75 cc/hr  Hypokalemia - Replace and follow  GERD - IV PPI -holding Oral Pantoprazole 40 daily at present a npo  Essential hypertension - When necessary IV hydralazine  Anxiety - When necessary benzodiazepines.  Anemia - Possibly chronic. Stable. Follow CBCs  Diabetic peripheral neuropathy - Continue Lyrica   DVT prophylaxis: Lovenox Code Status: Full Family Communication: None at bedside Disposition Plan: Continue treatment in stepdown unit for additional 24 hours. DC home when medically stable, possibly in 3-4 days.   Consultants:  None  Procedures:  None  Antibiotics:  None   Subjective:  Feels poorly Nursing indicates has vomited 6-8 x on 10/11 One episode this am Asking for pain med escalation which was denied and rationale behind decision epxlained to her Falling asleep halfway throught her time I examine her  Objective: Filed Vitals:   04/05/15 2236 04/06/15 0313 04/06/15 0445 04/06/15 0700  BP:   138/70   Pulse:   104   Temp: 98.6 F (37 C) 97.9 F (36.6 C)  98.1 F (36.7 C)  TempSrc: Oral Oral  Oral  Resp:   20   Height:      Weight:      SpO2:   100%     Intake/Output Summary (Last 24 hours) at 04/06/15 0826 Last data filed at 04/06/15 0700  Gross per 24 hour  Intake   1800 ml  Output  700 ml  Net   1100 ml   Filed Weights   04/04/15 2225  Weight: 63.05 kg (139 lb)     Exam:  General exam: Pleasant young female lying comfortably supine in bed. Oral mucosa dry. Respiratory system: Clear. No increased work of breathing. Cardiovascular system: S1 & S2 heard, RRR. No JVD, murmurs, gallops, clicks or pedal edema. Telemetry: Sinus tachycardia in the  120s. Gastrointestinal system: Abdomen is nondistended, soft. Diffuse mild tenderness without peritoneal signs. Normal bowel sounds heard. Central nervous system: Alert and oriented. No focal neurological deficits. Extremities: Symmetric 5 x 5 power.   Data Reviewed: Basic Metabolic Panel:  Recent Labs Lab 04/05/15 0808 04/05/15 1505 04/05/15 1855 04/05/15 2253 04/06/15 0302  NA 140 139 139 134* 138  K 3.4* 4.9 4.0 3.9 3.9  CL 108 112* 108 106 104  CO2 12* 14* 18* 16* 19*  GLUCOSE 224* 128* 137* 182* 120*  BUN 9 5* <5* <5* <5*  CREATININE 1.15* 0.82 0.83 0.81 0.87  CALCIUM 9.0 9.0 8.8* 8.4* 9.0   Liver Function Tests:  Recent Labs Lab 03/30/15 1532  AST 34  ALT 23  ALKPHOS 84  BILITOT 1.3*  PROT 7.9  ALBUMIN 3.8    Recent Labs Lab 03/30/15 1532  LIPASE 44   No results for input(s): AMMONIA in the last 168 hours. CBC:  Recent Labs Lab 03/30/15 1532 04/04/15 0400 04/05/15 0233 04/05/15 0238 04/05/15 0808 04/06/15 0302  WBC 6.3  --  6.7  --  9.7 10.5  NEUTROABS  --   --  5.7  --  9.2*  --   HGB 12.3 13.9 10.4* 11.2* 11.2* 11.7*  HCT 37.3 41.0 32.7* 33.0* 34.3* 35.5*  MCV 90.1  --  91.1  --  90.0 89.2  PLT 429*  --  281  --  337 366   Cardiac Enzymes:  Recent Labs Lab 04/05/15 0808  TROPONINI <0.03   BNP (last 3 results) No results for input(s): PROBNP in the last 8760 hours. CBG:  Recent Labs Lab 04/06/15 0057 04/06/15 0150 04/06/15 0304 04/06/15 0504 04/06/15 0607  GLUCAP 160* 131* 117* 132* 153*    Recent Results (from the past 240 hour(s))  MRSA PCR Screening     Status: None   Collection Time: 04/05/15  5:18 AM  Result Value Ref Range Status   MRSA by PCR NEGATIVE NEGATIVE Final    Comment:        The GeneXpert MRSA Assay (FDA approved for NASAL specimens only), is one component of a comprehensive MRSA colonization surveillance program. It is not intended to diagnose MRSA infection nor to guide or monitor treatment  for MRSA infections.            Studies: No results found.      Scheduled Meds: . enoxaparin (LOVENOX) injection  40 mg Subcutaneous Q24H  . Influenza vac split quadrivalent PF  0.5 mL Intramuscular Tomorrow-1000  . pantoprazole (PROTONIX) IV  40 mg Intravenous Q12H   Continuous Infusions: . sodium chloride    . dextrose 5 % and 0.45% NaCl 150 mL/hr at 04/06/15 0731  . insulin (NOVOLIN-R) infusion 3.6 Units/hr (04/06/15 0817)    Principal Problem:   DKA (diabetic ketoacidoses) ([place]) Active Problems:   Nausea and vomiting   Diabetic gastroparesis ([place])   Gastroparesis   DKA, type 1, not at goal [facility])    Time spent: 45 minutes.  Verneita Griffes, MD [facility] [phone]

## 2015-04-06 NOTE — Progress Notes (Signed)
Pt has been refusing to eat currently but consuming small amount of water. Pt laying on stomach refusing to have monitors placed back on her. MD was notified and gave order to D/C telemetry to reduce monitoring.

## 2015-04-07 ENCOUNTER — Inpatient Hospital Stay (HOSPITAL_COMMUNITY): Payer: 59

## 2015-04-07 DIAGNOSIS — E0810 Diabetes mellitus due to underlying condition with ketoacidosis without coma: Secondary | ICD-10-CM

## 2015-04-07 LAB — BASIC METABOLIC PANEL
ANION GAP: 18 — AB (ref 5–15)
ANION GAP: 12 (ref 5–15)
ANION GAP: 14 (ref 5–15)
ANION GAP: 13 (ref 5–15)
Anion gap: 10 (ref 5–15)
BUN: <5 mg/dL — ABNORMAL LOW (ref 6–20)
BUN: 6 mg/dL (ref 6–20)
BUN: <5 mg/dL — ABNORMAL LOW (ref 6–20)
BUN: 6 mg/dL (ref 6–20)
BUN: 7 mg/dL (ref 6–20)
CALCIUM: 9 mg/dL (ref 8.9–10.3)
CALCIUM: 8.6 mg/dL — AB (ref 8.9–10.3)
CHLORIDE: 108 mmol/L (ref 101–111)
CHLORIDE: 105 mmol/L (ref 101–111)
CHLORIDE: 103 mmol/L (ref 101–111)
CHLORIDE: 99 mmol/L — AB (ref 101–111)
CO2: 17 mmol/L — ABNORMAL LOW (ref 22–32)
CO2: 21 mmol/L — AB (ref 22–32)
CO2: 20 mmol/L — AB (ref 22–32)
CO2: 22 mmol/L (ref 22–32)
CO2: 23 mmol/L (ref 22–32)
CREATININE: 1.16 mg/dL — AB (ref 0.44–1.00)
Calcium: 8.7 mg/dL — ABNORMAL LOW (ref 8.9–10.3)
Calcium: 9.2 mg/dL (ref 8.9–10.3)
Calcium: 8.1 mg/dL — ABNORMAL LOW (ref 8.9–10.3)
Chloride: 99 mmol/L — ABNORMAL LOW (ref 101–111)
Creatinine, Ser: 0.99 mg/dL (ref 0.44–1.00)
Creatinine, Ser: 0.98 mg/dL (ref 0.44–1.00)
Creatinine, Ser: 1.15 mg/dL — ABNORMAL HIGH (ref 0.44–1.00)
Creatinine, Ser: 1.25 mg/dL — ABNORMAL HIGH (ref 0.44–1.00)
GFR calc Af Amer: >60 mL/min (ref >60)
GFR calc non Af Amer: >60 mL/min (ref >60)
GFR calc non Af Amer: 56 mL/min — ABNORMAL LOW (ref >60)
GFR calc non Af Amer: 50 mL/min — ABNORMAL LOW (ref >60)
GFR calc non Af Amer: 55 mL/min — ABNORMAL LOW (ref >60)
GFR, EST AFRICAN AMERICAN: 58 mL/min — AB (ref >60)
GLUCOSE: 426 mg/dL — AB (ref 65–99)
GLUCOSE: 229 mg/dL — AB (ref 65–99)
Glucose, Bld: 113 mg/dL — ABNORMAL HIGH (ref 65–99)
Glucose, Bld: 112 mg/dL — ABNORMAL HIGH (ref 65–99)
Glucose, Bld: 264 mg/dL — ABNORMAL HIGH (ref 65–99)
POTASSIUM: 3.4 mmol/L — AB (ref 3.5–5.1)
Potassium: 3.8 mmol/L (ref 3.5–5.1)
Potassium: 3.7 mmol/L (ref 3.5–5.1)
Potassium: 3.1 mmol/L — ABNORMAL LOW (ref 3.5–5.1)
Potassium: 3 mmol/L — ABNORMAL LOW (ref 3.5–5.1)
SODIUM: 134 mmol/L — AB (ref 135–145)
SODIUM: 132 mmol/L — AB (ref 135–145)
Sodium: 141 mmol/L (ref 135–145)
Sodium: 139 mmol/L (ref 135–145)
Sodium: 138 mmol/L (ref 135–145)

## 2015-04-07 LAB — GLUCOSE, CAPILLARY
GLUCOSE-CAPILLARY: 251 mg/dL — AB (ref 65–99)
GLUCOSE-CAPILLARY: 148 mg/dL — AB (ref 65–99)
GLUCOSE-CAPILLARY: 237 mg/dL — AB (ref 65–99)
Glucose-Capillary: 134 mg/dL — ABNORMAL HIGH (ref 65–99)
Glucose-Capillary: 82 mg/dL (ref 65–99)
Glucose-Capillary: 209 mg/dL — ABNORMAL HIGH (ref 65–99)

## 2015-04-07 MED ORDER — INSULIN ASPART 100 UNIT/ML ~~LOC~~ SOLN
10.0000 [IU] | Freq: Once | SUBCUTANEOUS | Status: AC
  Administered 2015-04-07: 10 [IU] via SUBCUTANEOUS

## 2015-04-07 MED ORDER — SODIUM CHLORIDE 0.9 % IV SOLN
INTRAVENOUS | Status: DC
  Administered 2015-04-07 – 2015-04-08 (×2): via INTRAVENOUS
  Administered 2015-04-09: 50 mL/h via INTRAVENOUS

## 2015-04-07 MED ORDER — LIDOCAINE HCL 1 % IJ SOLN
INTRAMUSCULAR | Status: AC
  Filled 2015-04-07: qty 20

## 2015-04-07 MED ORDER — INSULIN ASPART 100 UNIT/ML ~~LOC~~ SOLN
0.0000 [IU] | SUBCUTANEOUS | Status: DC
  Administered 2015-04-07 (×2): 3 [IU] via SUBCUTANEOUS
  Administered 2015-04-07: 1 [IU] via SUBCUTANEOUS
  Administered 2015-04-08 (×2): 3 [IU] via SUBCUTANEOUS
  Administered 2015-04-08: 2 [IU] via SUBCUTANEOUS
  Administered 2015-04-09: 1 [IU] via SUBCUTANEOUS

## 2015-04-07 MED ORDER — INSULIN ASPART 100 UNIT/ML ~~LOC~~ SOLN: 0.0000 [IU] | SUBCUTANEOUS | Status: DC

## 2015-04-07 NOTE — Progress Notes (Addendum)
Inpatient Diabetes Program Recommendations  AACE/ADA: New Consensus Statement on Inpatient Glycemic Control (2015)  Target Ranges:  Prepandial:   less than 140 mg/dL      Peak postprandial:   less than 180 mg/dL (1-2 hours)      Critically ill patients:  140 - 180 mg/dL   Results for [name], [name] (MRN [id number]) as of 04/07/2015 08:26  Ref. Range 04/07/2015 01:02  Sodium Latest Ref Range: 135-145 mmol/L 134 (L)  Potassium Latest Ref Range: 3.5-5.1 mmol/L 3.4 (L)  Chloride Latest Ref Range: 101-111 mmol/L 99 (L)  CO2 Latest Ref Range: 22-32 mmol/L 17 (L)  BUN Latest Ref Range: 6-20 mg/dL <5 (L)  Creatinine Latest Ref Range: 0.44-1.00 mg/dL 0.99  Calcium Latest Ref Range: 8.9-10.3 mg/dL 8.7 (L)  EGFR (Non-African Amer.) Latest Ref Range: >60 mL/min >60  EGFR (African American) Latest Ref Range: >60 mL/min >60  Glucose Latest Ref Range: 65-99 mg/dL 426 (H)  Anion gap Latest Ref Range: 5-15  18 (H)    Home DM Meds: Levemir 15 units QHS  Novolog SSI  Current Insulin Orders: Lantus 10 units QHS        Novolog Sensitive SSI Q4 hours        Novolog 3 units tidwc    -Note patient's IV access lost yesterday afternoon.  Additional IV access placement attempted X 2 without success.  -Note MD placed orders for SQ insulin when IV access could not be attained.  -IV insulin drip stopped at ~4PM but Lantus 10 units not given until midnight last night.  Per DKA Protocol Order set, Lantus should be given 1-2 hours before IV insulin drip stopped.  This, combined with patient's ongoing nausea and vomiting could be driving's patient's CO2 downward and Anion Gap upward?  -Note patient was given 10 units Novolog at 3am as well.    --Will follow patient during hospitalization--  Wyn Quaker RN, MSN, CDE Diabetes Coordinator Inpatient Glycemic Control Team Team Pager: [phone] (8a-5p)

## 2015-04-07 NOTE — Progress Notes (Signed)
PROGRESS NOTE    [name] YTK:[id number] DOB: Jun 02, 1966 DOA: 04/05/2015 PCP: Barbette Merino, MD  HPI/Brief narrative 48-year-ol ?  DM 1 since age 49 years with gastroparesis and neuropathy,  HTN,  anxiety,  anemia, G ERD,  hospitalized multiple times, 8/16-8/21 for DKA,  8/27-8/30 for nausea and vomiting,  9/13-9/17 for gastroparesis flare and possible colitis,  followed at [facility],   admitted on 04/05/15 with 2 day history of nausea, nonbloody emesis, abdominal pain typical of her prior gastroparesis exacerbations. No diarrhea reported. Claimed compliance with her insulin regimen  . In ED, noted to have DKA and admitted to stepdown unit for further management. Patient stated that Marinol given by her GI at [facility] for gastroparesis has not helped much.   Assessment/Plan:  DKA in type I DM, not at goal, complicated with Gastroparesis/Neuropathy - Attempting transition off of Glucomander however may be forced to place her back on it as still acidotic -No good IV access and patient not taking adequate by mouth -Monitor BMP every 4 hourly. - HbA1c 8.9 suggesting poor control -will be difficult to get her Co2 and Gap in range because of on going vomit and N  Intractable nausea, vomiting and abdominal pain - Likely secondary to diabetic gastroparesis - Apparently allergic to Reglan. - Continue supportive treatment with bowel rest, IV PPI and antiemetics. IV fluids. - Advance diet gradually after DKA resolved. Minimize opioid pain medications-I have clearly explained to her we will not escaalte her pain meds and will use Tramadol as 1st choice -continue Phenergan 12.5 q6 prn -continue Marinol 10 mg bid when able to tol PO -difficult situation-she was refferred by GI here in [place] to [facility] and has intolerances to Reglan [slurred sp[eech] and phenergan [drowsy].  Has trialed EEC in the past--because of persisting issues with nausea vomiting and gastroparesis I have  asked her to try to take Jell-O and may be chicken broth in an graduate slowly. We will have gastroenterology come and see the patient as well  Dehydration - Secondary to GI losses. IV fluids to continue  Acute kidney injury - initial BUN/Creat 9/1.18-->5/0.87 -Continue IV fluids normal saline at 150 cc per hour when PICC line is placed -Might need to switch to D5 if replaced back on Glucomander  Hypokalemia - Replace and follow  GERD - IV PPI -holding Oral Pantoprazole 40 daily at present a npo  Essential hypertension - When necessary IV hydralazine  Anxiety - When necessary benzodiazepines.  Anemia - Possibly chronic. Stable. Follow CBCs  Diabetic peripheral neuropathy - Continue Lyrica   DVT prophylaxis: Lovenox Code Status: Full Family Communication: None at bedside Disposition Plan: Continue treatment in stepdown unit for additional 24 hours. expect that this will be a long course of treatment    Consultants:  None  Procedures:  None  Antibiotics:  None   Subjective   2-3 episodes of vomiting but not able to tolerate by mouth Abdominal pain is still present patient feels swollen lower extremities but feels this is because of not being on Lasix No chest pain no shortness of breath  Objective: Filed Vitals:   04/06/15 2314 04/06/15 2320 04/07/15 0403 04/07/15 0709  BP:  167/97  101/52  Pulse:  119  106  Temp: 98.5 F (36.9 C)  99.4 F (37.4 C)   TempSrc: Oral  Oral   Resp:  22  17  Height:      Weight:      SpO2:  100%  96%  Intake/Output Summary (Last 24 hours) at 04/07/15 0804 Last data filed at 04/06/15 1700  Gross per 24 hour  Intake      0 ml  Output    850 ml  Net   -850 ml   Filed Weights   04/04/15 2225  Weight: 63.05 kg (139 lb)     Exam:  General exam: Pleasant young female lying comfortably supine in bed. Oral mucosa dry. Respiratory system: Clear. No increased work of breathing. Cardiovascular system: S1 & S2  heard, RRR. No JVD, murmurs, gallops, clicks or pedal edema. Telemetry: Sinus tachycardia in the 120s. Gastrointestinal system: Abdomen is nondistended, soft. Diffuse mild tenderness without peritoneal signs. Normal bowel sounds heard. Central nervous system: Alert and oriented. some pain in lower extremities   Data Reviewed: Basic Metabolic Panel:  Recent Labs Lab 04/06/15 0302 04/06/15 0758 04/06/15 1705 04/06/15 2300 04/07/15 0102  NA 138 134* 133* 136 134*  K 3.9 3.2* 3.1* 3.5 3.4*  CL 104 101 101 103 99*  CO2 19* 20* 22 19* 17*  GLUCOSE 120* 214* 138* 292* 426*  BUN <5* <5* <5* <5* <5*  CREATININE 0.87 0.74 0.78 0.82 0.99  CALCIUM 9.0 8.7* 8.6* 8.4* 8.7*   Liver Function Tests: No results for input(s): AST, ALT, ALKPHOS, BILITOT, PROT, ALBUMIN in the last 168 hours. No results for input(s): LIPASE, AMYLASE in the last 168 hours. No results for input(s): AMMONIA in the last 168 hours. CBC:  Recent Labs Lab 04/04/15 0400 04/05/15 0233 04/05/15 0238 04/05/15 0808 04/06/15 0302  WBC  --  6.7  --  9.7 10.5  NEUTROABS  --  5.7  --  9.2*  --   HGB 13.9 10.4* 11.2* 11.2* 11.7*  HCT 41.0 32.7* 33.0* 34.3* 35.5*  MCV  --  91.1  --  90.0 89.2  PLT  --  281  --  337 366   Cardiac Enzymes:  Recent Labs Lab 04/05/15 0808  TROPONINI <0.03   BNP (last 3 results) No results for input(s): PROBNP in the last 8760 hours. CBG:  Recent Labs Lab 04/06/15 1622 04/06/15 1741 04/06/15 2139 04/07/15 0514 04/07/15 0738  GLUCAP 141* 164* 171* 251* 134*    Recent Results (from the past 240 hour(s))  MRSA PCR Screening     Status: None   Collection Time: 04/05/15  5:18 AM  Result Value Ref Range Status   MRSA by PCR NEGATIVE NEGATIVE Final    Comment:        The GeneXpert MRSA Assay (FDA approved for NASAL specimens only), is one component of a comprehensive MRSA colonization surveillance program. It is not intended to diagnose MRSA infection nor to guide  or monitor treatment for MRSA infections.            Studies: No results found.      Scheduled Meds: . enoxaparin (LOVENOX) injection  40 mg Subcutaneous Q24H  . Influenza vac split quadrivalent PF  0.5 mL Intramuscular Tomorrow-1000  . insulin aspart  0-9 Units Subcutaneous Q4H  . insulin aspart  3 Units Subcutaneous TID WC  . insulin glargine  10 Units Subcutaneous QHS  . LORazepam  0.5 mg Oral Q8H  . pantoprazole  40 mg Oral BID  . potassium chloride  20 mEq Oral Once   Continuous Infusions: . sodium chloride    . sodium chloride    . dextrose 5 % and 0.45% NaCl Stopped (04/06/15 1705)  . insulin (NOVOLIN-R) infusion Stopped (04/06/15 1704)    Principal  Problem:   DKA (diabetic ketoacidoses) (HCC) Active Problems:   Nausea and vomiting   Diabetic gastroparesis (HCC)   Gastroparesis   DKA, type 1, not at goal [facility])    Time spent: 25 minutes.  Verneita Griffes, MD [facility] [phone]

## 2015-04-07 NOTE — Progress Notes (Signed)
CRITICAL VALUE ALERT  Critical value received:  BMET  Date of notification:  04/07/15  Time of notification:  0240  Critical value read back:Yes.    Nurse who received alert:  Mirtha Jain   MD notified (1st page):  Raliegh Ip. Schorr  Time of first page:  0245  MD notified (2nd page): N/A  Time of second page: N/A  Responding MD:  K.Schorr  Time MD responded: [id number]  Orders recieved

## 2015-04-07 NOTE — Progress Notes (Signed)
Peripherally Inserted Central Catheter/Midline Placement  The IV Nurse has discussed with the patient and/or persons authorized to consent for the patient, the purpose of this procedure and the potential benefits and risks involved with this procedure.  The benefits include less needle sticks, lab draws from the catheter and patient may be discharged home with the catheter.  Risks include, but not limited to, infection, bleeding, blood clot (thrombus formation), and puncture of an artery; nerve damage and irregular heat beat.  Alternatives to this procedure were also discussed.  PICC/Midline Placement Documentation        Darlyn Read 04/07/2015, 11:43 AM

## 2015-04-07 NOTE — Progress Notes (Signed)
Order placed and and consent signed for PICC line placement. Assessed left upper arm and veins are to small with catheter occupies 48% brachial vein and 83% of the basilic vein. Assessed the right arm with basilic  vein  Only being 30% accessed vein on first attempt but was unable to thread PICC past axillary . Staff Nurse aware and notify MD.

## 2015-04-07 NOTE — Procedures (Signed)
Interventional Radiology Procedure Note  Procedure:  Right arm PICC line placement  Complications:  None  Estimated Blood Loss: < 10 mL  30 cm DL Power PICC placed via right basilic vein.  Tip at cavoatrial junction.  Venetia Night. Kathlene Cote, M.D Pager:  [phone]

## 2015-04-08 ENCOUNTER — Encounter: Payer: Self-pay | Admitting: Family Medicine

## 2015-04-08 ENCOUNTER — Encounter (HOSPITAL_COMMUNITY): Payer: Self-pay | Admitting: Physician Assistant

## 2015-04-08 LAB — BASIC METABOLIC PANEL
ANION GAP: 7 (ref 5–15)
ANION GAP: 6 (ref 5–15)
Anion gap: 7 (ref 5–15)
BUN: 5 mg/dL — ABNORMAL LOW (ref 6–20)
BUN: 6 mg/dL (ref 6–20)
BUN: 7 mg/dL (ref 6–20)
CALCIUM: 7.8 mg/dL — AB (ref 8.9–10.3)
CALCIUM: 8.1 mg/dL — AB (ref 8.9–10.3)
CALCIUM: 8.4 mg/dL — AB (ref 8.9–10.3)
CHLORIDE: 105 mmol/L (ref 101–111)
CHLORIDE: 105 mmol/L (ref 101–111)
CO2: 23 mmol/L (ref 22–32)
CO2: 24 mmol/L (ref 22–32)
CO2: 25 mmol/L (ref 22–32)
CREATININE: 1.02 mg/dL — AB (ref 0.44–1.00)
CREATININE: 1.08 mg/dL — AB (ref 0.44–1.00)
Chloride: 103 mmol/L (ref 101–111)
Creatinine, Ser: 0.99 mg/dL (ref 0.44–1.00)
GFR calc Af Amer: >60 mL/min (ref >60)
GFR calc Af Amer: >60 mL/min (ref >60)
GFR calc non Af Amer: >60 mL/min (ref >60)
GFR calc non Af Amer: >60 mL/min (ref >60)
GLUCOSE: 242 mg/dL — AB (ref 65–99)
GLUCOSE: 100 mg/dL — AB (ref 65–99)
GLUCOSE: 105 mg/dL — AB (ref 65–99)
POTASSIUM: 3.3 mmol/L — AB (ref 3.5–5.1)
Potassium: 2.9 mmol/L — ABNORMAL LOW (ref 3.5–5.1)
Potassium: 3.1 mmol/L — ABNORMAL LOW (ref 3.5–5.1)
Sodium: 137 mmol/L (ref 135–145)
Sodium: 136 mmol/L (ref 135–145)
Sodium: 132 mmol/L — ABNORMAL LOW (ref 135–145)

## 2015-04-08 LAB — GLUCOSE, CAPILLARY
GLUCOSE-CAPILLARY: 97 mg/dL (ref 65–99)
GLUCOSE-CAPILLARY: 98 mg/dL (ref 65–99)
GLUCOSE-CAPILLARY: 171 mg/dL — AB (ref 65–99)
GLUCOSE-CAPILLARY: 222 mg/dL — AB (ref 65–99)
Glucose-Capillary: 118 mg/dL — ABNORMAL HIGH (ref 65–99)
Glucose-Capillary: 208 mg/dL — ABNORMAL HIGH (ref 65–99)

## 2015-04-08 LAB — MAGNESIUM: MAGNESIUM: 1.4 mg/dL — AB (ref 1.7–2.4)

## 2015-04-08 MED ORDER — PREGABALIN 75 MG PO CAPS
150.0000 mg | ORAL_CAPSULE | Freq: Two times a day (BID) | ORAL | Status: DC
  Administered 2015-04-08 – 2015-04-09 (×3): 150 mg via ORAL
  Filled 2015-04-08 (×3): qty 2

## 2015-04-08 MED ORDER — PANTOPRAZOLE SODIUM 40 MG PO TBEC: 40.0000 mg | DELAYED_RELEASE_TABLET | Freq: Two times a day (BID) | ORAL | Status: DC

## 2015-04-08 MED ORDER — ALPRAZOLAM 0.5 MG PO TABS
0.5000 mg | ORAL_TABLET | Freq: Once | ORAL | Status: AC
  Administered 2015-04-08: 0.5 mg via ORAL
  Filled 2015-04-08: qty 1

## 2015-04-08 MED ORDER — INSULIN ASPART 100 UNIT/ML ~~LOC~~ SOLN
3.0000 [IU] | Freq: Three times a day (TID) | SUBCUTANEOUS | Status: DC
Start: 2015-04-09 — End: 2015-04-09
  Administered 2015-04-09: 3 [IU] via SUBCUTANEOUS

## 2015-04-08 MED ORDER — TRAMADOL HCL 50 MG PO TABS: 50.0000 mg | ORAL_TABLET | Freq: Two times a day (BID) | ORAL | Status: DC | PRN

## 2015-04-08 MED ORDER — POTASSIUM CHLORIDE 10 MEQ/100ML IV SOLN
10.0000 meq | INTRAVENOUS | Status: AC
  Administered 2015-04-08 (×3): 10 meq via INTRAVENOUS
  Filled 2015-04-08 (×3): qty 100

## 2015-04-08 MED ORDER — HYDROMORPHONE HCL 2 MG PO TABS
1.0000 mg | ORAL_TABLET | Freq: Four times a day (QID) | ORAL | Status: DC | PRN
Start: 2015-04-08 — End: 2015-04-09

## 2015-04-08 NOTE — Progress Notes (Signed)
PROGRESS NOTE    [name] YSA:[id number] DOB: 09-22-1965 DOA: 04/05/2015 PCP: Barbette Merino, MD  HPI/Brief narrative 48-year-ol ?  DM 1 since age 49 years with gastroparesis and neuropathy,  HTN,  anxiety,  anemia, G ERD,  hospitalized multiple times, 8/16-8/21 for DKA,  8/27-8/30 for nausea and vomiting,  9/13-9/17 for gastroparesis flare and possible colitis,  followed at [facility],   admitted on 04/05/15 with 2 day history of nausea, nonbloody emesis, abdominal pain typical of her prior gastroparesis exacerbations. No diarrhea reported. Claimed compliance with her insulin regimen  In ED, noted to have DKA and admitted to stepdown unit for further management. Patient stated that Marinol given by her GI at [facility] for gastroparesis has not helped much.   Assessment/Plan:  DKA in type I DM, not at goal, complicated with Gastroparesis/Neuropathy -No good IV access ?PICC placed 10/13 - HbA1c 8.9 suggesting poor control -resolved as of 10/14  DM ty1 - resume ssi -Lantus 10 u qhs -Qid-achs coverage + sensitive SSI -CBGS' 97 -209  Intractable nausea, vomiting and abdominal pain - Likely secondary to diabetic gastroparesis - Apparently allergic to Reglan. - Continue supportive treatment with bowel rest, IV PPI and antiemetics. IV fluids. - Advance diet gradually after DKA resolved.  Minimize opioids and cut back dosing today 10/14 -continue Phenergan 12.5 q6 prn -continue Marinol 10 mg bid when able to tol PO -difficult situation-she was refferred by GI here in [place] to [facility] and has intolerances to Reglan [slurred sp[eech] and phenergan [drowsy].  Has trialed EEC in the past. -recommended close f/u GI at [facility] some PO now  Dehydration - Secondary to GI losses. IV fluids rate cut back now as tol PO  Acute kidney injury - initial BUN/Creat 9/1.18-->5/0.87 -Continue IV fluids slower rate  Hypokalemia - Replace and follow -currently 3.1  GERD - IV  PPI -holding Oral Pantoprazole 40 daily at present a npo  Essential hypertension - When necessary IV hydralazine  Anxiety - When necessary benzodiazepines.  Anemia - Possibly chronic. Stable. Follow CBCs  Diabetic peripheral neuropathy - Continue Lyrica when able   DVT prophylaxis: Lovenox Code Status: Full Family Communication: None at bedside Disposition Plan: transfer tele-ensure a couple of meals prior to d/c home   Consultants:  None  Procedures:  None  Antibiotics:  None   Subjective   No vomit, ate diet somewhat Ordered lunch RN reports not asking for pain meds etc  Objective: Filed Vitals:   04/08/15 0017 04/08/15 0430 04/08/15 0700 04/08/15 0735  BP: 105/46 119/60  114/51  Pulse: 88 81  87  Temp: 97.9 F (36.6 C) 98.5 F (36.9 C) 98.2 F (36.8 C)   TempSrc: Oral Oral Oral   Resp: 10 16  16   Height:      Weight:      SpO2: 99% 99%  99%    Intake/Output Summary (Last 24 hours) at 04/08/15 1117 Last data filed at 04/08/15 0900  Gross per 24 hour  Intake   3100 ml  Output    525 ml  Net   2575 ml   Filed Weights   04/04/15 2225  Weight: 63.05 kg (139 lb)     Exam:  General exam: Pleasant young female lying comfortably supine in bed. Oral mucosa dry. Respiratory system: Clear. No increased work of breathing. Cardiovascular system: S1 & S2 heard, RRR. No JVD, murmurs, gallops, clicks or pedal edema. Telemetry: Sinus tachycardia in the 120s. Gastrointestinal system: Abdomen is nondistended, soft.  Data Reviewed: Basic Metabolic Panel:  Recent Labs Lab 04/07/15 1310 04/07/15 1600 04/07/15 2115 04/08/15 0050 04/08/15 0500  NA 139 138 132* 137 136  K 3.7 3.1* 3.0* 2.9* 3.1*  CL 105 103 99* 105 105  CO2 20* 22 23 25 24   GLUCOSE 112* 264* 229* 105* 100*  BUN <5* 6 7 5* 6  CREATININE 1.15* 1.25* 1.16* 1.08* 1.02*  CALCIUM 9.0 8.6* 8.1* 8.4* 8.1*  MG  --   --   --   --  1.4*   Liver Function Tests: No results for input(s):  AST, ALT, ALKPHOS, BILITOT, PROT, ALBUMIN in the last 168 hours. No results for input(s): LIPASE, AMYLASE in the last 168 hours. No results for input(s): AMMONIA in the last 168 hours. CBC:  Recent Labs Lab 04/04/15 0400 04/05/15 0233 04/05/15 0238 04/05/15 0808 04/06/15 0302  WBC  --  6.7  --  9.7 10.5  NEUTROABS  --  5.7  --  9.2*  --   HGB 13.9 10.4* 11.2* 11.2* 11.7*  HCT 41.0 32.7* 33.0* 34.3* 35.5*  MCV  --  91.1  --  90.0 89.2  PLT  --  281  --  337 366   Cardiac Enzymes:  Recent Labs Lab 04/05/15 0808  TROPONINI <0.03   BNP (last 3 results) No results for input(s): PROBNP in the last 8760 hours. CBG:  Recent Labs Lab 04/07/15 1655 04/07/15 2038 04/08/15 0019 04/08/15 0432 04/08/15 0734  GLUCAP 237* 209* 118* 97 98    Recent Results (from the past 240 hour(s))  MRSA PCR Screening     Status: None   Collection Time: 04/05/15  5:18 AM  Result Value Ref Range Status   MRSA by PCR NEGATIVE NEGATIVE Final    Comment:        The GeneXpert MRSA Assay (FDA approved for NASAL specimens only), is one component of a comprehensive MRSA colonization surveillance program. It is not intended to diagnose MRSA infection nor to guide or monitor treatment for MRSA infections.            Studies: Ir Fluoro Guide Cv Line Right  04/07/2015  CLINICAL DATA:  Colitis, dehydration and intractable vomiting. The patient requires a central catheter. EXAM: POWER PICC LINE PLACEMENT WITH ULTRASOUND AND FLUOROSCOPIC GUIDANCE FLUOROSCOPY TIME:  6 seconds. PROCEDURE: The patient was advised of the possible risks and complications and agreed to undergo the procedure. The patient was then brought to the angiographic suite for the procedure. A time-out was performed prior to the procedure. The right arm was prepped with chlorhexidine, draped in the usual sterile fashion using maximum barrier technique (cap and mask, sterile gown, sterile gloves, large sterile sheet, hand hygiene  and cutaneous antisepsis) and infiltrated locally with 1% Lidocaine. Ultrasound demonstrated patency of the right basilic vein, and this was documented with an image. Under real-time ultrasound guidance, this vein was accessed with a 21 gauge micropuncture needle and image documentation was performed. A 0.018 wire was introduced in to the vein. Over this, a 5.0 [place] dual lumen power injectable PICC was advanced to the lower SVC/right atrial junction. Fluoroscopy during the procedure and fluoro spot radiograph confirms appropriate catheter position. The catheter was flushed and covered with a sterile dressing. Catheter length: 30 cm COMPLICATIONS: None IMPRESSION: Successful right arm power injectable PICC line placement with ultrasound and fluoroscopic guidance. The catheter is ready for use. Electronically Signed   By: Aletta Edouard M.D.   On: 04/07/2015 17:35   Ir  US Guide Vasc Access Right  04/07/2015  CLINICAL DATA:  Colitis, dehydration and intractable vomiting. The patient requires a central catheter. EXAM: POWER PICC LINE PLACEMENT WITH ULTRASOUND AND FLUOROSCOPIC GUIDANCE FLUOROSCOPY TIME:  6 seconds. PROCEDURE: The patient was advised of the possible risks and complications and agreed to undergo the procedure. The patient was then brought to the angiographic suite for the procedure. A time-out was performed prior to the procedure. The right arm was prepped with chlorhexidine, draped in the usual sterile fashion using maximum barrier technique (cap and mask, sterile gown, sterile gloves, large sterile sheet, hand hygiene and cutaneous antisepsis) and infiltrated locally with 1% Lidocaine. Ultrasound demonstrated patency of the right basilic vein, and this was documented with an image. Under real-time ultrasound guidance, this vein was accessed with a 21 gauge micropuncture needle and image documentation was performed. A 0.018 wire was introduced in to the vein. Over this, a 5.0 [place] dual lumen  power injectable PICC was advanced to the lower SVC/right atrial junction. Fluoroscopy during the procedure and fluoro spot radiograph confirms appropriate catheter position. The catheter was flushed and covered with a sterile dressing. Catheter length: 30 cm COMPLICATIONS: None IMPRESSION: Successful right arm power injectable PICC line placement with ultrasound and fluoroscopic guidance. The catheter is ready for use. Electronically Signed   By: Aletta Edouard M.D.   On: 04/07/2015 17:35        Scheduled Meds: . enoxaparin (LOVENOX) injection  40 mg Subcutaneous Q24H  . Influenza vac split quadrivalent PF  0.5 mL Intramuscular Tomorrow-1000  . insulin aspart  0-9 Units Subcutaneous 6 times per day  . insulin aspart  3 Units Subcutaneous TID WC  . insulin glargine  10 Units Subcutaneous QHS  . LORazepam  0.5 mg Oral Q8H  . pantoprazole  40 mg Oral BID  . potassium chloride  20 mEq Oral Once  . pregabalin  150 mg Oral BID   Continuous Infusions: . sodium chloride    . sodium chloride 50 mL/hr at 04/08/15 1111  . dextrose 5 % and 0.45% NaCl Stopped (04/06/15 1705)  . insulin (NOVOLIN-R) infusion Stopped (04/06/15 1704)    Principal Problem:   DKA (diabetic ketoacidoses) (Winter) Active Problems:   Nausea and vomiting   Diabetic gastroparesis (HCC)   Gastroparesis   DKA, type 1, not at goal [facility])    Time spent: 25 minutes.  Verneita Griffes, MD [facility] [phone]

## 2015-04-09 DIAGNOSIS — E876 Hypokalemia: Secondary | ICD-10-CM

## 2015-04-09 LAB — GLUCOSE, CAPILLARY
GLUCOSE-CAPILLARY: 120 mg/dL — AB (ref 65–99)
GLUCOSE-CAPILLARY: 124 mg/dL — AB (ref 65–99)
GLUCOSE-CAPILLARY: 152 mg/dL — AB (ref 65–99)
Glucose-Capillary: 86 mg/dL (ref 65–99)

## 2015-04-09 LAB — BASIC METABOLIC PANEL
Anion gap: 6 (ref 5–15)
BUN: 5 mg/dL — ABNORMAL LOW (ref 6–20)
CHLORIDE: 106 mmol/L (ref 101–111)
CO2: 23 mmol/L (ref 22–32)
Calcium: 8.1 mg/dL — ABNORMAL LOW (ref 8.9–10.3)
Creatinine, Ser: 0.79 mg/dL (ref 0.44–1.00)
GFR calc non Af Amer: >60 mL/min (ref >60)
Glucose, Bld: 152 mg/dL — ABNORMAL HIGH (ref 65–99)
POTASSIUM: 3.1 mmol/L — AB (ref 3.5–5.1)
SODIUM: 135 mmol/L (ref 135–145)

## 2015-04-09 MED ORDER — ONDANSETRON 4 MG PO TBDP: 4.0000 mg | ORAL_TABLET | Freq: Three times a day (TID) | ORAL | Status: DC | PRN

## 2015-04-09 MED ORDER — POTASSIUM CHLORIDE CRYS ER 20 MEQ PO TBCR: 40.0000 meq | EXTENDED_RELEASE_TABLET | Freq: Once | ORAL | Status: DC

## 2015-04-09 MED ORDER — SODIUM CHLORIDE 0.9 % IJ SOLN
10.0000 mL | INTRAMUSCULAR | Status: DC | PRN
  Administered 2015-04-09: 20 mL
  Filled 2015-04-09: qty 40

## 2015-04-09 NOTE — Progress Notes (Signed)
Patient discharge teaching given, including activity, diet, follow-up appoints, and medications. Patient verbalized understanding of all discharge instructions. IV access was d/c'd. Vitals are stable. Skin is intact except as charted in most recent assessments. Pt to be escorted out by NT, to be driven home by family. 

## 2015-04-09 NOTE — Discharge Summary (Signed)
Physician Discharge Summary  [name] FUX:[id number] DOB: Nov 20, 1965 DOA: 04/05/2015  PCP: Yvette Merino, MD  Admit date: 04/05/2015 Discharge date: 04/09/2015  Time spent: minutes  Recommendations for Outpatient Follow-up:   1. Follow with [facility] for management of [name] has been referred from GI in [place] as they weren't able to manage her symptoms here 2. Get bmet and cbc ~ 1 week 3. Refilled Zofran 4. Given Rx for Potassium 5. Will need ? ACE inhibitor to prevent Nephroptah-suggest initiation as OP   Discharge Diagnoses:  Principal Problem:   DKA (diabetic ketoacidoses) ([place]) Active Problems:   Nausea and vomiting   Diabetic gastroparesis ([facility])   Gastroparesis   DKA, type 1, not at goal [facility])   Discharge Condition: fair  Diet recommendation: Diabetic [facility]  Filed Weights   04/04/15 2225 04/08/15 1543  Weight: 63.05 kg (139 lb) 59.4 kg (130 lb 15.3 oz)    History of present illness:   48-year-ol ?  DM 1 since age 53 years with gastroparesis and neuropathy,  HTN, anxiety,  anemia, G ERD,  hospitalized multiple times, 8/16-8/21 for DKA,  8/27-8/30 for nausea and vomiting,  9/13-9/17 for gastroparesis flare and possible colitis, followed at [place],  admitted on 04/05/15 with 2 day history of nausea, nonbloody emesis, abdominal pain typical of her prior gastroparesis exacerbations. No diarrhea reported. Claimed compliance with her insulin regimen In ED, noted to have DKA and admitted to stepdown unit for further management. Patient stated that Marinol given by her GI at [facility] for gastroparesis has not helped much.  Hospital Course:  DKA in type I DM, not at goal, complicated with Gastroparesis/Neuropathy -No good IV access ?PICC placed 10/13 - HbA1c 8.9 suggesting poor control -resolved as of 10/14  DM ty1 - resume ssi -Lantus 10 u qhs--> increased to dose of 15 U on d/c -Qid-achs coverage + sensitive SSI -CBGS well  controlled in 100 range at time of d/c  Intractable nausea, vomiting and abdominal pain - Likely secondary to diabetic gastroparesis - Apparently allergic to Reglan. - Continue supportive treatment with bowel rest, IV PPI and antiemetics. IV fluids. - Advance diet gradually after DKA resolved.  Minimize opioids and cut back dosing today 10/14 -was on Phenergan 12.5 q6 prn -continue Marinol 10 mg bid when able to tol PO -difficult situation-she was refferred by GI here in [place] to [facility] and has intolerances to Reglan [slurred sp[eech] and phenergan [drowsy]. Has trialed EEC in the past. -recommended close f/u GI at [facility] -by discharge she was tolerating a full diet  Dehydration - Secondary to GI losses. -saline locked on d/c home Acute kidney injury - initial BUN/Creat 9/1.18-->5/0.87 -Continue IV fluids slower rate  Hypokalemia - Replace and follow -currently 3.1 -replaced with potassium 40 daily on d/c home  GERD - IV PPI - Oral Pantoprazole 40 daily on d/c home  Essential hypertension - When necessary IV hydralazine - did not require any therapy as inpatient-sugges tACE initiation as OP  Anxiety - When necessary benzodiazepines. -no refills on d/c  Anemia - Possibly chronic. Stable. Follow CBCs  Diabetic peripheral neuropathy - restarted Lyrica on d/c     Discharge Exam: Filed Vitals:   04/09/15 0627  BP: 115/57  Pulse: 81  Temp: 98.3 F (36.8 C)  Resp: 18    General: alert oriented and pleasant.  NO issues Cardiovascular: s1 s 2 no m/r/g Respiratory: clear no added sound  Discharge Instructions    Current Discharge Medication List    START  taking these medications   Details  potassium chloride SA (K-DUR,KLOR-CON) 20 MEQ tablet Take 2 tablets (40 mEq total) by mouth once. Qty: 60 tablet, Refills: 0      CONTINUE these medications which have CHANGED   Details  ondansetron (ZOFRAN ODT) 4 MG disintegrating tablet Take 1 tablet (4 mg total) by  mouth every 8 (eight) hours as needed for nausea or vomiting. Qty: 30 tablet, Refills: 0      CONTINUE these medications which have NOT CHANGED   Details  acetaminophen (TYLENOL) 325 MG tablet Take 650 mg by mouth every 6 (six) hours as needed for moderate pain (cramping).     dronabinol (MARINOL) 10 MG capsule Take 10 mg by mouth 2 (two) times daily.    ferrous sulfate 325 (65 FE) MG tablet Take 325 mg by mouth daily.     insulin aspart (NOVOLOG) 100 UNIT/ML injection Inject 0-15 Units into the skin 3 (three) times daily with meals. 6-15 units three times daily with food.  Per sliding scale Qty: 1 vial, Refills: 12    insulin detemir (LEVEMIR) 100 UNIT/ML injection Inject 0.15 mLs (15 Units total) into the skin at bedtime. Qty: 10 mL, Refills: 3    LORazepam (ATIVAN) 0.5 MG tablet Take 1 tablet by mouth 3 (three) times daily as needed for anxiety.     pantoprazole (PROTONIX) 40 MG tablet Take 40 mg by mouth 2 (two) times daily.    pregabalin (LYRICA) 150 MG capsule Take 1 capsule (150 mg total) by mouth 2 (two) times daily. Qty: 60 capsule, Refills: 0      STOP taking these medications     ciprofloxacin (CIPRO) 500 MG tablet      metroNIDAZOLE (FLAGYL) 500 MG tablet      ondansetron (ZOFRAN) 4 MG tablet        Allergies  Allergen Reactions  . Anesthetics, Amide Nausea And Vomiting  . Penicillins Diarrhea and Nausea And Vomiting    Has patient had a PCN reaction causing immediate rash, facial/tongue/throat swelling, SOB or lightheadedness with hypotension: No Has patient had a PCN reaction causing severe rash involving mucus membranes or skin necrosis: No Has patient had a PCN reaction that required hospitalization Yes  Has patient had a PCN reaction occurring within the last 10 years: Yes  If all of the above answers are "NO", then may proceed with Cephalosporin use.   . Buprenorphine Hcl Rash  . Encainide Nausea And Vomiting  . Metoclopramide Rash  . Morphine And  Related Rash      The results of significant diagnostics from this hospitalization (including imaging, microbiology, ancillary and laboratory) are listed below for reference.    Significant Diagnostic Studies: Ct Abdomen Pelvis Wo Contrast  03/10/2015  CLINICAL DATA:  Diabetes. Constipation. Gastro paresis. Currently presents with intractable nausea and vomiting for several days with associated abdominal cramps. EXAM: CT ABDOMEN AND PELVIS WITHOUT CONTRAST TECHNIQUE: Multidetector CT imaging of the abdomen and pelvis was performed following the standard protocol without IV contrast. COMPARISON:  11/15/2014 FINDINGS: Exam detail is diminished secondary to lack of IV contrast material. Lower chest: The lung bases are clear. No pleural effusion identified. Hepatobiliary: Previous cholecystectomy. The liver appears normal. No biliary dilatation identified. Pancreas: Unremarkable appearance of the pancreas. Spleen: The spleen is unremarkable. Adrenals/Urinary Tract: Unremarkable appearance of the adrenal glands. Normal appearance of the kidneys. No hydronephrosis or hydroureter identified. There is moderate distension of the urinary bladder. Stomach/Bowel: The stomach is normal. The small bowel loops  have a normal caliber. There is no evidence for small bowel obstruction. The appendix is visualized and appears normal. Evaluation of the colon is diminished due to lack of both IV contrast material and oral contrast within the colon. Within this limitation there may be mild diffuse colonic wall thickening versus colonic under distention. No evidence for large bowel obstruction. No pneumatosis or bowel perforation. Vascular/Lymphatic: Calcified atherosclerotic disease involves the abdominal aorta. No aneurysm. No enlarged retroperitoneal or mesenteric adenopathy. No enlarged pelvic or inguinal lymph nodes. Reproductive: The uterus and the adnexal structures are unremarkable. Other: No free fluid or fluid collections  identified within the abdomen or pelvis. Musculoskeletal: No aggressive lytic or sclerotic bone lesions. IMPRESSION: 1. Exam detail diminished due to lack of IV contrast material. Additionally, the oral contrast material fills only the normal appearing small bowel loops. 2. Incomplete distention versus mild wall thickening involving the colon which may indicate colitis. No pneumatosis, abnormal bowel distention or bowel perforation identified. 3. Previous cholecystectomy 4. Aortic atherosclerosis. Electronically Signed   By: Kerby Moors M.D.   On: 03/10/2015 16:31   Ir Fluoro Guide Cv Line Right  04/07/2015  CLINICAL DATA:  Colitis, dehydration and intractable vomiting. The patient requires a central catheter. EXAM: POWER PICC LINE PLACEMENT WITH ULTRASOUND AND FLUOROSCOPIC GUIDANCE FLUOROSCOPY TIME:  6 seconds. PROCEDURE: The patient was advised of the possible risks and complications and agreed to undergo the procedure. The patient was then brought to the angiographic suite for the procedure. A time-out was performed prior to the procedure. The right arm was prepped with chlorhexidine, draped in the usual sterile fashion using maximum barrier technique (cap and mask, sterile gown, sterile gloves, large sterile sheet, hand hygiene and cutaneous antisepsis) and infiltrated locally with 1% Lidocaine. Ultrasound demonstrated patency of the right basilic vein, and this was documented with an image. Under real-time ultrasound guidance, this vein was accessed with a 21 gauge micropuncture needle and image documentation was performed. A 0.018 wire was introduced in to the vein. Over this, a 5.0 [place] dual lumen power injectable PICC was advanced to the lower SVC/right atrial junction. Fluoroscopy during the procedure and fluoro spot radiograph confirms appropriate catheter position. The catheter was flushed and covered with a sterile dressing. Catheter length: 30 cm COMPLICATIONS: None IMPRESSION: Successful right  arm power injectable PICC line placement with ultrasound and fluoroscopic guidance. The catheter is ready for use. Electronically Signed   By: Aletta Edouard M.D.   On: 04/07/2015 17:35   Ir Fluoro Guide Cv Line Right  03/10/2015  CLINICAL DATA:  Nausea vomiting.  Needs  IV access. EXAM: PICC PLACEMENT WITH ULTRASOUND AND FLUOROSCOPY FLUOROSCOPY TIME:  6 seconds, 3 mGy TECHNIQUE: After written informed consent was obtained, patient was placed in the supine position on angiographic table. Patency of the right basilic vein was confirmed with ultrasound with image documentation. An appropriate skin site was determined. Skin site was marked. Region was prepped using maximum barrier technique including cap and mask, sterile gown, sterile gloves, large sterile sheet, and Chlorhexidine as cutaneous antisepsis. The region was infiltrated locally with 1% lidocaine. Under real-time ultrasound guidance, the right basilic vein was accessed with a 21 gauge micropuncture needle; the needle tip within the vein was confirmed with ultrasound image documentation. Needle exchanged over a 018 guidewire for a peel-away sheath, through which a 5-French double-lumen power injectable PICC trimmed to 33cm was advanced, positioned with its tip near the cavoatrial junction. Spot chest radiograph confirms appropriate catheter position. Catheter was  flushed per protocol and secured externally. The patient tolerated procedure well. COMPLICATIONS: COMPLICATIONS none IMPRESSION: 1. Technically successful five [place] double lumen power injectable PICC placement Electronically Signed   By: Lucrezia Europe M.D.   On: 03/10/2015 17:01   Ir US Guide Vasc Access Right  04/07/2015  CLINICAL DATA:  Colitis, dehydration and intractable vomiting. The patient requires a central catheter. EXAM: POWER PICC LINE PLACEMENT WITH ULTRASOUND AND FLUOROSCOPIC GUIDANCE FLUOROSCOPY TIME:  6 seconds. PROCEDURE: The patient was advised of the possible risks and  complications and agreed to undergo the procedure. The patient was then brought to the angiographic suite for the procedure. A time-out was performed prior to the procedure. The right arm was prepped with chlorhexidine, draped in the usual sterile fashion using maximum barrier technique (cap and mask, sterile gown, sterile gloves, large sterile sheet, hand hygiene and cutaneous antisepsis) and infiltrated locally with 1% Lidocaine. Ultrasound demonstrated patency of the right basilic vein, and this was documented with an image. Under real-time ultrasound guidance, this vein was accessed with a 21 gauge micropuncture needle and image documentation was performed. A 0.018 wire was introduced in to the vein. Over this, a 5.0 [place] dual lumen power injectable PICC was advanced to the lower SVC/right atrial junction. Fluoroscopy during the procedure and fluoro spot radiograph confirms appropriate catheter position. The catheter was flushed and covered with a sterile dressing. Catheter length: 30 cm COMPLICATIONS: None IMPRESSION: Successful right arm power injectable PICC line placement with ultrasound and fluoroscopic guidance. The catheter is ready for use. Electronically Signed   By: Aletta Edouard M.D.   On: 04/07/2015 17:35   Ir US Guide Vasc Access Right  03/10/2015  CLINICAL DATA:  Nausea vomiting.  Needs  IV access. EXAM: PICC PLACEMENT WITH ULTRASOUND AND FLUOROSCOPY FLUOROSCOPY TIME:  6 seconds, 3 mGy TECHNIQUE: After written informed consent was obtained, patient was placed in the supine position on angiographic table. Patency of the right basilic vein was confirmed with ultrasound with image documentation. An appropriate skin site was determined. Skin site was marked. Region was prepped using maximum barrier technique including cap and mask, sterile gown, sterile gloves, large sterile sheet, and Chlorhexidine as cutaneous antisepsis. The region was infiltrated locally with 1% lidocaine. Under real-time  ultrasound guidance, the right basilic vein was accessed with a 21 gauge micropuncture needle; the needle tip within the vein was confirmed with ultrasound image documentation. Needle exchanged over a 018 guidewire for a peel-away sheath, through which a 5-French double-lumen power injectable PICC trimmed to 33cm was advanced, positioned with its tip near the cavoatrial junction. Spot chest radiograph confirms appropriate catheter position. Catheter was flushed per protocol and secured externally. The patient tolerated procedure well. COMPLICATIONS: COMPLICATIONS none IMPRESSION: 1. Technically successful five [place] double lumen power injectable PICC placement Electronically Signed   By: Lucrezia Europe M.D.   On: 03/10/2015 17:01   Dg Abd 2 Views  03/21/2015  CLINICAL DATA:  Two day history of nausea and vomiting with abdominal pain EXAM: ABDOMEN - 2 VIEW COMPARISON:  CT abdomen and pelvis March 10, 2015 FINDINGS: Supine and upright images were obtained. There is a relative paucity of gas. There is no bowel dilatation or air-fluid level suggesting obstruction. No free air. A small benign-appearing calcification is noted in the mid pelvic region. Lung bases are clear. There are surgical clips in the right upper quadrant region. IMPRESSION: Relative paucity of gas. This finding may be seen normally. It also, however, may be seen with  early ileus or enteritis. Obstruction is not felt to be likely. No free air. Electronically Signed   By: Lowella Grip III M.D.   On: 03/21/2015 07:02   Dg Abd Acute W/chest  04/04/2015  CLINICAL DATA:  Nausea and vomiting beginning 3 hours ago. Constipation for 5 days. History of gastroparesis. EXAM: DG ABDOMEN ACUTE W/ 1V CHEST COMPARISON:  03/21/2015 FINDINGS: Normal heart size and pulmonary vascularity. No focal airspace disease or consolidation in the lungs. No blunting of costophrenic angles. No pneumothorax. Mediastinal contours appear intact. Scattered gas and stool in  the colon. No small or large bowel distention. No free intra-abdominal air. No abnormal air-fluid levels. No radiopaque stones. Visualized bones appear intact. Surgical clips in the right upper quadrant. IMPRESSION: No evidence of active pulmonary disease. Normal nonobstructive bowel gas pattern. No gastric distention. Electronically Signed   By: Lucienne Capers M.D.   On: 04/04/2015 05:15    Microbiology: Recent Results (from the past 240 hour(s))  MRSA PCR Screening     Status: None   Collection Time: 04/05/15  5:18 AM  Result Value Ref Range Status   MRSA by PCR NEGATIVE NEGATIVE Final    Comment:        The GeneXpert MRSA Assay (FDA approved for NASAL specimens only), is one component of a comprehensive MRSA colonization surveillance program. It is not intended to diagnose MRSA infection nor to guide or monitor treatment for MRSA infections.      Labs: Basic Metabolic Panel:  Recent Labs Lab 04/07/15 2115 04/08/15 0050 04/08/15 0500 04/08/15 1532 04/09/15 0510  NA 132* 137 136 132* 135  K 3.0* 2.9* 3.1* 3.3* 3.1*  CL 99* 105 105 103 106  CO2 23 25 24 23 23   GLUCOSE 229* 105* 100* 242* 152*  BUN 7 5* 6 7 5*  CREATININE 1.16* 1.08* 1.02* 0.99 0.79  CALCIUM 8.1* 8.4* 8.1* 7.8* 8.1*  MG  --   --  1.4*  --   --    Liver Function Tests: No results for input(s): AST, ALT, ALKPHOS, BILITOT, PROT, ALBUMIN in the last 168 hours. No results for input(s): LIPASE, AMYLASE in the last 168 hours. No results for input(s): AMMONIA in the last 168 hours. CBC:  Recent Labs Lab 04/04/15 0400 04/05/15 0233 04/05/15 0238 04/05/15 0808 04/06/15 0302  WBC  --  6.7  --  9.7 10.5  NEUTROABS  --  5.7  --  9.2*  --   HGB 13.9 10.4* 11.2* 11.2* 11.7*  HCT 41.0 32.7* 33.0* 34.3* 35.5*  MCV  --  91.1  --  90.0 89.2  PLT  --  281  --  337 366   Cardiac Enzymes:  Recent Labs Lab 04/05/15 0808  TROPONINI <0.03   BNP: BNP (last 3 results) No results for input(s): BNP in the  last 8760 hours.  ProBNP (last 3 results) No results for input(s): PROBNP in the last 8760 hours.  CBG:  Recent Labs Lab 04/08/15 1636 04/08/15 2013 04/09/15 0012 04/09/15 0406 04/09/15 0752  GLUCAP 222* 208* 152* 124* 86       Signed:  Nita Sells  [facility] 04/09/2015, 8:22 AM

## 2015-04-09 NOTE — Progress Notes (Signed)
While going over discharge paperwork with patient, she voiced concern of something wrong with her feet. This RN assessed feet and saw they looked like blisters to bilateral heels.  I asked her if she wanted to wait for the MD to take a look at them before she was discharged but she stated "I don't want to wait any longer, that's going to be another hour or more and my ride is here so I will just see my primary doctor."

## 2015-04-11 ENCOUNTER — Encounter (HOSPITAL_COMMUNITY): Payer: Self-pay | Admitting: *Deleted

## 2015-04-11 ENCOUNTER — Emergency Department (HOSPITAL_COMMUNITY)
Admission: EM | Admit: 2015-04-11 | Discharge: 2015-04-11 | Disposition: A | Discharge status: Home / Self Care | Payer: 59 | Attending: Emergency Medicine | Admitting: Emergency Medicine

## 2015-04-11 DIAGNOSIS — J45909 Unspecified asthma, uncomplicated: Secondary | ICD-10-CM | POA: Insufficient documentation

## 2015-04-11 DIAGNOSIS — D649 Anemia, unspecified: Secondary | ICD-10-CM | POA: Insufficient documentation

## 2015-04-11 DIAGNOSIS — K3184 Gastroparesis: Secondary | ICD-10-CM

## 2015-04-11 DIAGNOSIS — Z3202 Encounter for pregnancy test, result negative: Secondary | ICD-10-CM | POA: Insufficient documentation

## 2015-04-11 DIAGNOSIS — K219 Gastro-esophageal reflux disease without esophagitis: Secondary | ICD-10-CM | POA: Insufficient documentation

## 2015-04-11 DIAGNOSIS — Z794 Long term (current) use of insulin: Secondary | ICD-10-CM | POA: Insufficient documentation

## 2015-04-11 DIAGNOSIS — Z79899 Other long term (current) drug therapy: Secondary | ICD-10-CM | POA: Insufficient documentation

## 2015-04-11 DIAGNOSIS — I10 Essential (primary) hypertension: Secondary | ICD-10-CM | POA: Insufficient documentation

## 2015-04-11 DIAGNOSIS — E1143 Type 2 diabetes mellitus with diabetic autonomic (poly)neuropathy: Secondary | ICD-10-CM | POA: Insufficient documentation

## 2015-04-11 DIAGNOSIS — Z880 Allergy status to penicillin: Secondary | ICD-10-CM | POA: Insufficient documentation

## 2015-04-11 DIAGNOSIS — F419 Anxiety disorder, unspecified: Secondary | ICD-10-CM | POA: Insufficient documentation

## 2015-04-11 DIAGNOSIS — Z8669 Personal history of other diseases of the nervous system and sense organs: Secondary | ICD-10-CM | POA: Insufficient documentation

## 2015-04-11 DIAGNOSIS — E1140 Type 2 diabetes mellitus with diabetic neuropathy, unspecified: Secondary | ICD-10-CM | POA: Insufficient documentation

## 2015-04-11 LAB — URINE MICROSCOPIC-ADD ON

## 2015-04-11 LAB — CBC
HCT: 31 % — ABNORMAL LOW (ref 36.0–46.0)
Hemoglobin: 10.2 g/dL — ABNORMAL LOW (ref 12.0–15.0)
MCH: 29.1 pg (ref 26.0–34.0)
MCHC: 32.9 g/dL (ref 30.0–36.0)
MCV: 88.3 fL (ref 78.0–100.0)
Platelets: 317 10*3/uL (ref 150–400)
RBC: 3.51 MIL/uL — ABNORMAL LOW (ref 3.87–5.11)
RDW: 14.9 % (ref 11.5–15.5)
WBC: 4.4 10*3/uL (ref 4.0–10.5)

## 2015-04-11 LAB — COMPREHENSIVE METABOLIC PANEL
ALBUMIN: 3.5 g/dL (ref 3.5–5.0)
ALK PHOS: 65 U/L (ref 38–126)
ALT: 19 U/L (ref 14–54)
ANION GAP: 11 (ref 5–15)
AST: 30 U/L (ref 15–41)
BILIRUBIN TOTAL: 1.1 mg/dL (ref 0.3–1.2)
BUN: 6 mg/dL (ref 6–20)
CALCIUM: 8.9 mg/dL (ref 8.9–10.3)
CO2: 25 mmol/L (ref 22–32)
Chloride: 103 mmol/L (ref 101–111)
Creatinine, Ser: 0.87 mg/dL (ref 0.44–1.00)
GFR calc Af Amer: >60 mL/min (ref >60)
GLUCOSE: 242 mg/dL — AB (ref 65–99)
Potassium: 3.5 mmol/L (ref 3.5–5.1)
Sodium: 139 mmol/L (ref 135–145)
TOTAL PROTEIN: 6.6 g/dL (ref 6.5–8.1)

## 2015-04-11 LAB — LIPASE, BLOOD: LIPASE: 18 U/L — AB (ref 22–51)

## 2015-04-11 LAB — URINALYSIS, ROUTINE W REFLEX MICROSCOPIC
BILIRUBIN URINE: NEGATIVE
Glucose, UA: 500 mg/dL — AB
Leukocytes, UA: NEGATIVE
Nitrite: NEGATIVE
PROTEIN: 100 mg/dL — AB
Specific Gravity, Urine: 1.021 (ref 1.005–1.030)
UROBILINOGEN UA: 1 mg/dL (ref 0.0–1.0)
pH: 6 (ref 5.0–8.0)

## 2015-04-11 LAB — CBG MONITORING, ED: GLUCOSE-CAPILLARY: 209 mg/dL — AB (ref 65–99)

## 2015-04-11 LAB — POC URINE PREG, ED: Preg Test, Ur: NEGATIVE

## 2015-04-11 MED ORDER — PROMETHAZINE HCL 25 MG/ML IJ SOLN
25.0000 mg | Freq: Once | INTRAMUSCULAR | Status: AC
  Administered 2015-04-11: 25 mg via INTRAVENOUS
  Filled 2015-04-11: qty 1

## 2015-04-11 MED ORDER — ONDANSETRON HCL 4 MG/2ML IJ SOLN
4.0000 mg | Freq: Once | INTRAMUSCULAR | Status: AC
  Administered 2015-04-11: 4 mg via INTRAVENOUS
  Filled 2015-04-11: qty 2

## 2015-04-11 MED ORDER — SODIUM CHLORIDE 0.9 % IV BOLUS (SEPSIS)
1000.0000 mL | Freq: Once | INTRAVENOUS | Status: AC
Start: 2015-04-11 — End: 2015-04-11
  Administered 2015-04-11: 1000 mL via INTRAVENOUS

## 2015-04-11 MED ORDER — SODIUM CHLORIDE 0.9 % IV BOLUS (SEPSIS)
1000.0000 mL | Freq: Once | INTRAVENOUS | Status: AC
  Administered 2015-04-11: 1000 mL via INTRAVENOUS

## 2015-04-11 MED ORDER — HYDROMORPHONE HCL 1 MG/ML IJ SOLN
1.0000 mg | Freq: Once | INTRAMUSCULAR | Status: AC
  Administered 2015-04-11: 1 mg via INTRAVENOUS
  Filled 2015-04-11: qty 1

## 2015-04-11 NOTE — ED Notes (Signed)
Per EMS, pt from home, here for abd pain and n/v.  Has hx of gastroparesis.

## 2015-04-11 NOTE — ED Notes (Signed)
IV team unable to start IV.  Provider notified.

## 2015-04-11 NOTE — ED Notes (Signed)
Attempted to collect labs, however unsuccess.

## 2015-04-11 NOTE — ED Notes (Signed)
At time of attempted discharge, Pt reports nausea and pain and demands Dilaudid.  Pt began retching when RN began going over DC papers.  Pt had been calm and had no vomiting for the hour prior to attempted DC.  Provider notified.

## 2015-04-11 NOTE — ED Provider Notes (Signed)
CSN: [id number]     Arrival date & time 04/11/15  1522 History   First MD Initiated Contact with Patient 04/11/15 1724     Chief Complaint  Patient presents with  . Abdominal Pain     (Consider location/radiation/quality/duration/timing/severity/associated sxs/prior Treatment) HPI  49 year old female with a history of gastroparesis presents with a recurrent flare of her gastroparesis. Having pain since earlier this morning with nausea and vomiting. Unable to tolerate oral fluids. Was discharged 2 days ago after being in the hospital for several days for the same. Denies any fevers. No urinary symptoms. No diarrhea. She is requesting Dilaudid and Zofran and states she needs her Ativan a she has not taken this since last night. Rates her pain as severe. Pain is periumbilical.  Past Medical History  Diagnosis Date  . Hypertension   . Cataract   . Anxiety   . Anemia     required blood transfusion  09/2014 at [facility]  . GERD (gastroesophageal reflux disease)   . Constipation 04/03/2012  . Complication of anesthesia   . PONV (postoperative nausea and vomiting)   . Asthma     IN CHILDHOOD  . Neuromuscular disorder ([place])     Diabetic neuropathy  . Gastroparesis   . Diabetic neuropathy ([place])   . History of blood product transfusion   . Diabetes mellitus     diagnosed age 46   Past Surgical History  Procedure Laterality Date  . Retinal detachment surgery    . Posterior vitrectomy and membrane peel-left eye  09/28/2002  . Posterior vitrectomy and membrane peel-right eye  03/16/2002  . Eye surgery    . Gailstones    . Esophagogastroduodenoscopy  09/27/2014    at [facility], Dr Rolan Lipa. biospy neg for celiac, neg for H pylori.   . Colonoscopy  09/27/2014    at [facility]   Family History  Problem Relation Age of Onset  . Cystic fibrosis Mother   . Hypertension Father   . Diabetes Brother   . Hypertension Maternal Grandmother    Social History  Substance Use Topics  . Smoking status:  Never Smoker   . Smokeless tobacco: Never Used  . Alcohol Use: No   OB History    No data available     Review of Systems  Constitutional: Negative for fever.  Gastrointestinal: Positive for nausea, vomiting and abdominal pain.  Genitourinary: Negative for dysuria.  All other systems reviewed and are negative.     Allergies  Anesthetics, amide; Penicillins; Buprenorphine hcl; Encainide; Metoclopramide; and Morphine and related  Home Medications   Prior to Admission medications   Medication Sig Start Date End Date Taking? Authorizing Provider  acetaminophen (TYLENOL) 325 MG tablet Take 650 mg by mouth every 6 (six) hours as needed for moderate pain (cramping).    Yes Historical Provider, MD  dronabinol (MARINOL) 10 MG capsule Take 10 mg by mouth 2 (two) times daily. 02/04/15  Yes Historical Provider, MD  furosemide (LASIX) 40 MG tablet Take 40 mg by mouth daily.   Yes Historical Provider, MD  insulin aspart (NOVOLOG) 100 UNIT/ML injection Inject 0-15 Units into the skin 3 (three) times daily with meals. 6-15 units three times daily with food.  Per sliding scale 03/12/15  Yes Belkys A Regalado, MD  insulin detemir (LEVEMIR) 100 UNIT/ML injection Inject 0.15 mLs (15 Units total) into the skin at bedtime. 02/17/13  Yes Robbie Lis, MD  LORazepam (ATIVAN) 0.5 MG tablet Take 1 tablet by mouth 3 (three) times  daily as needed for anxiety.  11/29/14  Yes Historical Provider, MD  mirtazapine (REMERON) 15 MG tablet Take 15 mg by mouth at bedtime.  03/19/15 04/18/15 Yes Historical Provider, MD  ondansetron (ZOFRAN ODT) 4 MG disintegrating tablet Take 1 tablet (4 mg total) by mouth every 8 (eight) hours as needed for nausea or vomiting. 04/09/15  Yes Nita Sells, MD  ondansetron (ZOFRAN) 4 MG tablet TAKE 1 OR 2 TABLETS BY MOUTH EVERY 6 HOURS AS NEEDED 03/30/15  Yes Historical Provider, MD  potassium chloride SA (K-DUR,KLOR-CON) 20 MEQ tablet Take 2 tablets (40 mEq total) by mouth once.  04/09/15  Yes Nita Sells, MD  pregabalin (LYRICA) 150 MG capsule Take 1 capsule (150 mg total) by mouth 2 (two) times daily. 02/17/13  Yes Robbie Lis, MD  ferrous sulfate 325 (65 FE) MG tablet Take 325 mg by mouth daily.     Historical Provider, MD  pantoprazole (PROTONIX) 40 MG tablet Take 40 mg by mouth 2 (two) times daily. 01/04/15   Historical Provider, MD   BP 158/59 mmHg  Pulse 83  Temp(Src) 98.4 F (36.9 C) (Oral)  Resp 17  SpO2 97%  LMP 03/11/2015 Physical Exam  Constitutional: She is oriented to person, place, and time. She appears well-developed and well-nourished.  HENT:  Head: Normocephalic and atraumatic.  Right Ear: External ear normal.  Left Ear: External ear normal.  Nose: Nose normal.  Eyes: Right eye exhibits no discharge. Left eye exhibits no discharge.  Cardiovascular: Normal rate, regular rhythm and normal heart sounds.   Pulmonary/Chest: Effort normal and breath sounds normal.  Abdominal: Soft. There is tenderness in the periumbilical area.  Neurological: She is alert and oriented to person, place, and time.  Skin: Skin is warm and dry.  Nursing note and vitals reviewed.   ED Course  Procedures (including critical care time) Labs Review Labs Reviewed  COMPREHENSIVE METABOLIC PANEL - Abnormal; Notable for the following:    Glucose, Bld 242 (*)    All other components within normal limits  CBC - Abnormal; Notable for the following:    RBC 3.51 (*)    Hemoglobin 10.2 (*)    HCT 31.0 (*)    All other components within normal limits  URINALYSIS, ROUTINE W REFLEX MICROSCOPIC (NOT AT [facility]) - Abnormal; Notable for the following:    Glucose, UA 500 (*)    Hgb urine dipstick LARGE (*)    Ketones, ur >80 (*)    Protein, ur 100 (*)    All other components within normal limits  LIPASE, BLOOD - Abnormal; Notable for the following:    Lipase 18 (*)    All other components within normal limits  CBG MONITORING, ED - Abnormal; Notable for the following:     Glucose-Capillary 209 (*)    All other components within normal limits  URINE MICROSCOPIC-ADD ON  POC URINE PREG, ED    Imaging Review No results found. I have personally reviewed and evaluated these images and lab results as part of my medical decision-making.   EKG Interpretation None      MDM   Final diagnoses:  Gastroparesis due to DM [facility])    Patient with recurrent nausea, vomiting, and abdominal pain attributed to gastroparesis. No new concerning findings to suggest needing new imaging. After IV pain medicine, IV fluids, and anti-emetics she feels much better. She did vomit once did not vomit anymore after being given Phenergan. She feels comfortable going home and has GI follow-up tomorrow. Discussed  strict return precautions and she states she has enough Zofran at home for now and will return if needed or go to her GI tomorrow.    Sherwood Gambler, MD 04/11/15 [phone]

## 2015-04-18 ENCOUNTER — Observation Stay (HOSPITAL_COMMUNITY): Payer: 59

## 2015-04-18 ENCOUNTER — Inpatient Hospital Stay (HOSPITAL_COMMUNITY)
Admission: EM | Admit: 2015-04-18 | Discharge: 2015-04-20 | DRG: 074 | Disposition: A | Discharge status: Home / Self Care | Payer: 59 | Attending: Internal Medicine | Admitting: Internal Medicine

## 2015-04-18 ENCOUNTER — Encounter (HOSPITAL_COMMUNITY): Payer: Self-pay

## 2015-04-18 DIAGNOSIS — R609 Edema, unspecified: Secondary | ICD-10-CM

## 2015-04-18 DIAGNOSIS — D649 Anemia, unspecified: Secondary | ICD-10-CM | POA: Diagnosis present

## 2015-04-18 DIAGNOSIS — E1065 Type 1 diabetes mellitus with hyperglycemia: Secondary | ICD-10-CM | POA: Diagnosis present

## 2015-04-18 DIAGNOSIS — E1043 Type 1 diabetes mellitus with diabetic autonomic (poly)neuropathy: Principal | ICD-10-CM | POA: Diagnosis present

## 2015-04-18 DIAGNOSIS — K3184 Gastroparesis: Secondary | ICD-10-CM

## 2015-04-18 DIAGNOSIS — Z794 Long term (current) use of insulin: Secondary | ICD-10-CM

## 2015-04-18 DIAGNOSIS — N390 Urinary tract infection, site not specified: Secondary | ICD-10-CM | POA: Diagnosis present

## 2015-04-18 DIAGNOSIS — Z79899 Other long term (current) drug therapy: Secondary | ICD-10-CM

## 2015-04-18 DIAGNOSIS — I10 Essential (primary) hypertension: Secondary | ICD-10-CM | POA: Diagnosis present

## 2015-04-18 DIAGNOSIS — K219 Gastro-esophageal reflux disease without esophagitis: Secondary | ICD-10-CM | POA: Diagnosis present

## 2015-04-18 DIAGNOSIS — J45909 Unspecified asthma, uncomplicated: Secondary | ICD-10-CM | POA: Diagnosis present

## 2015-04-18 DIAGNOSIS — F419 Anxiety disorder, unspecified: Secondary | ICD-10-CM | POA: Diagnosis present

## 2015-04-18 LAB — URINALYSIS, ROUTINE W REFLEX MICROSCOPIC
BILIRUBIN URINE: NEGATIVE
GLUCOSE, UA: 100 mg/dL — AB
Nitrite: NEGATIVE
PH: 6.5 (ref 5.0–8.0)
Protein, ur: 100 mg/dL — AB
Specific Gravity, Urine: 1.008 (ref 1.005–1.030)
Urobilinogen, UA: 1 mg/dL (ref 0.0–1.0)

## 2015-04-18 LAB — COMPREHENSIVE METABOLIC PANEL
ALT: 12 U/L — AB (ref 14–54)
AST: 18 U/L (ref 15–41)
Albumin: 3.5 g/dL (ref 3.5–5.0)
Alkaline Phosphatase: 82 U/L (ref 38–126)
Anion gap: 10 (ref 5–15)
BILIRUBIN TOTAL: 1.2 mg/dL (ref 0.3–1.2)
BUN: 6 mg/dL (ref 6–20)
CHLORIDE: 101 mmol/L (ref 101–111)
CO2: 25 mmol/L (ref 22–32)
CREATININE: 0.7 mg/dL (ref 0.44–1.00)
Calcium: 8.9 mg/dL (ref 8.9–10.3)
Glucose, Bld: 198 mg/dL — ABNORMAL HIGH (ref 65–99)
Potassium: 3.3 mmol/L — ABNORMAL LOW (ref 3.5–5.1)
Sodium: 136 mmol/L (ref 135–145)
TOTAL PROTEIN: 6.8 g/dL (ref 6.5–8.1)

## 2015-04-18 LAB — CBC
HCT: 31.1 % — ABNORMAL LOW (ref 36.0–46.0)
Hemoglobin: 10.3 g/dL — ABNORMAL LOW (ref 12.0–15.0)
MCH: 29.1 pg (ref 26.0–34.0)
MCHC: 33.1 g/dL (ref 30.0–36.0)
MCV: 87.9 fL (ref 78.0–100.0)
PLATELETS: 303 10*3/uL (ref 150–400)
RBC: 3.54 MIL/uL — AB (ref 3.87–5.11)
RDW: 14.6 % (ref 11.5–15.5)
WBC: 10.7 10*3/uL — AB (ref 4.0–10.5)

## 2015-04-18 LAB — I-STAT BETA HCG BLOOD, ED (MC, WL, AP ONLY): I-stat hCG, quantitative: <5 m[IU]/mL (ref <5)

## 2015-04-18 LAB — URINE MICROSCOPIC-ADD ON

## 2015-04-18 LAB — LIPASE, BLOOD: LIPASE: 15 U/L (ref 11–51)

## 2015-04-18 LAB — CBG MONITORING, ED: GLUCOSE-CAPILLARY: 269 mg/dL — AB (ref 65–99)

## 2015-04-18 MED ORDER — ONDANSETRON HCL 4 MG/2ML IJ SOLN
4.0000 mg | Freq: Once | INTRAMUSCULAR | Status: AC
  Administered 2015-04-18: 4 mg via INTRAVENOUS
  Filled 2015-04-18: qty 2

## 2015-04-18 MED ORDER — SODIUM CHLORIDE 0.9 % IV BOLUS (SEPSIS)
1000.0000 mL | Freq: Once | INTRAVENOUS | Status: AC
  Administered 2015-04-18: 1000 mL via INTRAVENOUS

## 2015-04-18 MED ORDER — LORAZEPAM 2 MG/ML IJ SOLN
1.0000 mg | Freq: Once | INTRAMUSCULAR | Status: AC
  Administered 2015-04-18: 1 mg via INTRAVENOUS
  Filled 2015-04-18: qty 1

## 2015-04-18 MED ORDER — SODIUM CHLORIDE 0.9 % IV BOLUS (SEPSIS)
1000.0000 mL | Freq: Once | INTRAVENOUS | Status: AC
  Administered 2015-04-19: 1000 mL via INTRAVENOUS

## 2015-04-18 MED ORDER — DEXTROSE 5 % IV SOLN
1.0000 g | Freq: Once | INTRAVENOUS | Status: AC
  Administered 2015-04-18: 1 g via INTRAVENOUS
  Filled 2015-04-18: qty 10

## 2015-04-18 MED ORDER — HYDROMORPHONE HCL 1 MG/ML IJ SOLN
1.0000 mg | Freq: Once | INTRAMUSCULAR | Status: AC
  Administered 2015-04-18: 1 mg via INTRAVENOUS
  Filled 2015-04-18: qty 1

## 2015-04-18 NOTE — ED Provider Notes (Signed)
CSN: [id number]     Arrival date & time 04/18/15  1411 History   First MD Initiated Contact with Patient 04/18/15 1728     Chief Complaint  Patient presents with  . N/V    . Abdominal Pain     (Consider location/radiation/quality/duration/timing/severity/associated sxs/prior Treatment) HPI   49 year old female with history of insulin-dependent diabetes, gastroparesis, GERD, hypertension presenting to the ED for evaluation of abdominal discomfort. Patient reports this morning she developed abdominal discomfort along with nausea and vomiting felt similar to prior gastroparesis. She is unable to keep any of her medication down.  States she has vomited 6 times today. She is specifically requesting for Ativan, Zofran, and Dilaudid for her symptoms. She mentioned she has gastroparesis flare on a weekly basis. She is scheduled to be seen by a specialist at [facility] in 4 days for a surgical procedure to help her with her gastroparesis "as last resort". She described the abdominal pain as a crampy sensation and also complaining of diaphoresis. She denies having fever but endorsed shakiness and chills. She also mentioned some mild dysuria and noticed that her urine has been cloudy. She reports chronic constipation last bowel movement 3 days ago. She is able to pass flatus.    Past Medical History  Diagnosis Date  . Hypertension   . Cataract   . Anxiety   . Anemia     required blood transfusion  09/2014 at [facility]  . GERD (gastroesophageal reflux disease)   . Constipation 04/03/2012  . Complication of anesthesia   . PONV (postoperative nausea and vomiting)   . Asthma     IN CHILDHOOD  . Neuromuscular disorder ([place])     Diabetic neuropathy  . Gastroparesis   . Diabetic neuropathy ([place])   . History of blood product transfusion   . Diabetes mellitus     diagnosed age 49   Past Surgical History  Procedure Laterality Date  . Retinal detachment surgery    . Posterior vitrectomy and membrane  peel-left eye  09/28/2002  . Posterior vitrectomy and membrane peel-right eye  03/16/2002  . Eye surgery    . Gailstones    . Esophagogastroduodenoscopy  09/27/2014    at [facility], Dr Rolan Lipa. biospy neg for celiac, neg for H pylori.   . Colonoscopy  09/27/2014    at [facility]   Family History  Problem Relation Age of Onset  . Cystic fibrosis Mother   . Hypertension Father   . Diabetes Brother   . Hypertension Maternal Grandmother    Social History  Substance Use Topics  . Smoking status: Never Smoker   . Smokeless tobacco: Never Used  . Alcohol Use: No   OB History    No data available     Review of Systems  All other systems reviewed and are negative.     Allergies  Anesthetics, amide; Penicillins; Buprenorphine hcl; Encainide; Metoclopramide; and Morphine and related  Home Medications   Prior to Admission medications   Medication Sig Start Date End Date Taking? Authorizing Provider  acetaminophen (TYLENOL) 325 MG tablet Take 650 mg by mouth every 6 (six) hours as needed for moderate pain (cramping).     Historical Provider, MD  dronabinol (MARINOL) 10 MG capsule Take 10 mg by mouth 2 (two) times daily. 02/04/15   Historical Provider, MD  ferrous sulfate 325 (65 FE) MG tablet Take 325 mg by mouth daily.     Historical Provider, MD  furosemide (LASIX) 40 MG tablet Take 40 mg by  mouth daily.    Historical Provider, MD  insulin aspart (NOVOLOG) 100 UNIT/ML injection Inject 0-15 Units into the skin 3 (three) times daily with meals. 6-15 units three times daily with food.  Per sliding scale 03/12/15   Belkys A Regalado, MD  insulin detemir (LEVEMIR) 100 UNIT/ML injection Inject 0.15 mLs (15 Units total) into the skin at bedtime. 02/17/13   Robbie Lis, MD  LORazepam (ATIVAN) 0.5 MG tablet Take 1 tablet by mouth 3 (three) times daily as needed for anxiety.  11/29/14   Historical Provider, MD  mirtazapine (REMERON) 15 MG tablet Take 15 mg by mouth at bedtime.  03/19/15 04/18/15   Historical Provider, MD  ondansetron (ZOFRAN ODT) 4 MG disintegrating tablet Take 1 tablet (4 mg total) by mouth every 8 (eight) hours as needed for nausea or vomiting. 04/09/15   Nita Sells, MD  ondansetron (ZOFRAN) 4 MG tablet TAKE 1 OR 2 TABLETS BY MOUTH EVERY 6 HOURS AS NEEDED 03/30/15   Historical Provider, MD  pantoprazole (PROTONIX) 40 MG tablet Take 40 mg by mouth 2 (two) times daily. 01/04/15   Historical Provider, MD  potassium chloride SA (K-DUR,KLOR-CON) 20 MEQ tablet Take 2 tablets (40 mEq total) by mouth once. 04/09/15   Nita Sells, MD  pregabalin (LYRICA) 150 MG capsule Take 1 capsule (150 mg total) by mouth 2 (two) times daily. 02/17/13   Robbie Lis, MD   BP 158/60 mmHg  Pulse 98  Temp(Src) 98.3 F (36.8 C) (Oral)  Resp 18  SpO2 100%  LMP 04/11/2015 Physical Exam  Constitutional: She appears well-developed and well-nourished. No distress.  African-American female, tremulous, diaphoretic and appears uncomfortable.  HENT:  Head: Atraumatic.  Eyes: Conjunctivae are normal.  Neck: Neck supple.  Cardiovascular: Normal rate and regular rhythm.   Pulmonary/Chest: Effort normal and breath sounds normal. No respiratory distress. She has no wheezes.  Abdominal: Soft. Bowel sounds are normal. She exhibits no distension. There is tenderness (Mild diffuse abdominal tenderness without focal point tenderness. No guarding or rebound tenderness.).  Neurological: She is alert.  Skin: No rash noted.  Psychiatric: She has a normal mood and affect.  Nursing note and vitals reviewed.   ED Course  Angiocath insertion Date/Time: 04/18/2015 6:57 PM Performed by: Domenic Moras Authorized by: Domenic Moras Consent: Verbal consent obtained. Risks and benefits: risks, benefits and alternatives were discussed Consent given by: patient Patient understanding: patient states understanding of the procedure being performed Patient identity confirmed: verbally with patient Comments:  US guided IV insertion to L brachial region.  Image saved.  Good blood return.    (including critical care time)  Patient presents with abdominal discomfort, nausea and vomiting consistence with her stroke paresis flare. This is a 14 visit in the past 6 months for similar complaint. She appears uncomfortable but she has a benign abdominal exam therefore I have low suspicion for acute emergent abdominal pathology. She is a difficult IV access therefore we will attempt ultrasound-guided IV.  8:52 PM Urine shows evidence of a UA checked infection. Will start patient on Rocephin. Urine culture sent. Labs are otherwise reassuring. Patient felt better after receiving pain medication and antinausea medication. She is still mildly nauseous but has not vomited. Will continue symptomatic treatment.  11:31 PM Pt vomited again and not feeling well, she will need to be admitted.  She denies dysuria.  I have consulted with [facility] Dr. Hal Hope who agrees to admit pt to med surg under his care.  He request acute  abdominal series, upreg, and UDS.   Labs Review Labs Reviewed  COMPREHENSIVE METABOLIC PANEL - Abnormal; Notable for the following:    Potassium 3.3 (*)    Glucose, Bld 198 (*)    ALT 12 (*)    All other components within normal limits  CBC - Abnormal; Notable for the following:    WBC 10.7 (*)    RBC 3.54 (*)    Hemoglobin 10.3 (*)    HCT 31.1 (*)    All other components within normal limits  URINALYSIS, ROUTINE W REFLEX MICROSCOPIC (NOT AT [facility]) - Abnormal; Notable for the following:    APPearance TURBID (*)    Glucose, UA 100 (*)    Hgb urine dipstick MODERATE (*)    Ketones, ur >80 (*)    Protein, ur 100 (*)    Leukocytes, UA LARGE (*)    All other components within normal limits  URINE MICROSCOPIC-ADD ON - Abnormal; Notable for the following:    Bacteria, UA MANY (*)    All other components within normal limits  CBG MONITORING, ED - Abnormal; Notable for the following:     Glucose-Capillary 269 (*)    All other components within normal limits  URINE CULTURE  LIPASE, BLOOD  URINE RAPID DRUG SCREEN, HOSP PERFORMED  I-STAT BETA HCG BLOOD, ED ([facility], [facility], [facility] ONLY)    Imaging Review No results found. I have personally reviewed and evaluated these images and lab results as part of my medical decision-making.   EKG Interpretation None      MDM   Final diagnoses:  Gastroparesis  UTI (lower urinary tract infection)    BP 152/69 mmHg  Pulse 105  Temp(Src) 98.3 F (36.8 C) (Oral)  Resp 18  SpO2 100%  LMP 04/11/2015     Domenic Moras, PA-C 04/18/15 2332  Milton Ferguson, MD 04/21/15 1521

## 2015-04-18 NOTE — ED Notes (Signed)
Patient is a hard stick requesting draw be done in the back.

## 2015-04-18 NOTE — ED Notes (Signed)
Unsuccessfully attempted to start a line and draw labs x 1.  [place]ie PA at bedside to attempt ultrasound IV start.

## 2015-04-18 NOTE — ED Notes (Signed)
Per EMS, N/V-history of gastroparesis-was hospitalized last week-no vomiting with EMS-non compliant with EMS questioning

## 2015-04-18 NOTE — ED Notes (Signed)
Pt request to draw blood during start of IV

## 2015-04-19 ENCOUNTER — Encounter (HOSPITAL_COMMUNITY): Payer: Self-pay | Admitting: Internal Medicine

## 2015-04-19 DIAGNOSIS — R1110 Vomiting, unspecified: Secondary | ICD-10-CM | POA: Diagnosis not present

## 2015-04-19 DIAGNOSIS — N390 Urinary tract infection, site not specified: Secondary | ICD-10-CM | POA: Diagnosis not present

## 2015-04-19 DIAGNOSIS — E1043 Type 1 diabetes mellitus with diabetic autonomic (poly)neuropathy: Secondary | ICD-10-CM | POA: Diagnosis not present

## 2015-04-19 DIAGNOSIS — K3184 Gastroparesis: Secondary | ICD-10-CM | POA: Diagnosis not present

## 2015-04-19 LAB — CBC WITH DIFFERENTIAL/PLATELET
Basophils Absolute: 0 10*3/uL (ref 0.0–0.1)
Basophils Relative: 0 %
Eosinophils Absolute: 0 10*3/uL (ref 0.0–0.7)
Eosinophils Relative: 0 %
HCT: 29.3 % — ABNORMAL LOW (ref 36.0–46.0)
HEMOGLOBIN: 9.6 g/dL — AB (ref 12.0–15.0)
LYMPHS ABS: 0.7 10*3/uL (ref 0.7–4.0)
LYMPHS PCT: 6 %
MCH: 29.3 pg (ref 26.0–34.0)
MCHC: 32.8 g/dL (ref 30.0–36.0)
MCV: 89.3 fL (ref 78.0–100.0)
MONOS PCT: 4 %
Monocytes Absolute: 0.5 10*3/uL (ref 0.1–1.0)
NEUTROS PCT: 90 %
Neutro Abs: 11 10*3/uL — ABNORMAL HIGH (ref 1.7–7.7)
Platelets: 326 10*3/uL (ref 150–400)
RBC: 3.28 MIL/uL — AB (ref 3.87–5.11)
RDW: 14.7 % (ref 11.5–15.5)
WBC: 12.3 10*3/uL — AB (ref 4.0–10.5)

## 2015-04-19 LAB — RAPID URINE DRUG SCREEN, HOSP PERFORMED
Amphetamines: NOT DETECTED
Barbiturates: NOT DETECTED
Benzodiazepines: POSITIVE — AB
COCAINE: NOT DETECTED
OPIATES: NOT DETECTED
TETRAHYDROCANNABINOL: POSITIVE — AB

## 2015-04-19 LAB — COMPREHENSIVE METABOLIC PANEL
ALT: 11 U/L — AB (ref 14–54)
AST: 18 U/L (ref 15–41)
Albumin: 3.2 g/dL — ABNORMAL LOW (ref 3.5–5.0)
Alkaline Phosphatase: 78 U/L (ref 38–126)
Anion gap: 16 — ABNORMAL HIGH (ref 5–15)
BUN: 9 mg/dL (ref 6–20)
CHLORIDE: 105 mmol/L (ref 101–111)
CO2: 16 mmol/L — AB (ref 22–32)
Calcium: 8.1 mg/dL — ABNORMAL LOW (ref 8.9–10.3)
Creatinine, Ser: 0.84 mg/dL (ref 0.44–1.00)
Glucose, Bld: 400 mg/dL — ABNORMAL HIGH (ref 65–99)
POTASSIUM: 3.9 mmol/L (ref 3.5–5.1)
SODIUM: 137 mmol/L (ref 135–145)
Total Bilirubin: 1.7 mg/dL — ABNORMAL HIGH (ref 0.3–1.2)
Total Protein: 6.2 g/dL — ABNORMAL LOW (ref 6.5–8.1)

## 2015-04-19 LAB — GLUCOSE, CAPILLARY
GLUCOSE-CAPILLARY: 115 mg/dL — AB (ref 65–99)
Glucose-Capillary: 336 mg/dL — ABNORMAL HIGH (ref 65–99)
Glucose-Capillary: 330 mg/dL — ABNORMAL HIGH (ref 65–99)
Glucose-Capillary: 240 mg/dL — ABNORMAL HIGH (ref 65–99)
Glucose-Capillary: 185 mg/dL — ABNORMAL HIGH (ref 65–99)

## 2015-04-19 LAB — PREGNANCY, URINE: PREG TEST UR: NEGATIVE

## 2015-04-19 MED ORDER — MIRTAZAPINE 15 MG PO TABS
15.0000 mg | ORAL_TABLET | Freq: Every day | ORAL | Status: DC
  Administered 2015-04-19: 15 mg via ORAL
  Filled 2015-04-19 (×2): qty 1

## 2015-04-19 MED ORDER — POLYETHYLENE GLYCOL 3350 17 G PO PACK: 17.0000 g | PACK | Freq: Every day | ORAL | Status: DC | PRN

## 2015-04-19 MED ORDER — FAMOTIDINE 20 MG PO TABS
20.0000 mg | ORAL_TABLET | Freq: Two times a day (BID) | ORAL | Status: DC
  Administered 2015-04-19 – 2015-04-20 (×2): 20 mg via ORAL
  Filled 2015-04-19 (×3): qty 1

## 2015-04-19 MED ORDER — ACETAMINOPHEN 325 MG PO TABS
650.0000 mg | ORAL_TABLET | Freq: Four times a day (QID) | ORAL | Status: DC | PRN
  Filled 2015-04-19: qty 2

## 2015-04-19 MED ORDER — CETYLPYRIDINIUM CHLORIDE 0.05 % MT LIQD
7.0000 mL | Freq: Two times a day (BID) | OROMUCOSAL | Status: DC
  Administered 2015-04-19 – 2015-04-20 (×3): 7 mL via OROMUCOSAL

## 2015-04-19 MED ORDER — DEXTROSE 5 % IV SOLN
1.0000 g | INTRAVENOUS | Status: DC
  Filled 2015-04-19: qty 10

## 2015-04-19 MED ORDER — ONDANSETRON HCL 4 MG/2ML IJ SOLN
4.0000 mg | Freq: Four times a day (QID) | INTRAMUSCULAR | Status: DC | PRN
  Administered 2015-04-19: 4 mg via INTRAVENOUS
  Filled 2015-04-19: qty 2

## 2015-04-19 MED ORDER — INSULIN DETEMIR 100 UNIT/ML ~~LOC~~ SOLN
15.0000 [IU] | Freq: Every day | SUBCUTANEOUS | Status: DC
  Administered 2015-04-19 (×2): 15 [IU] via SUBCUTANEOUS
  Filled 2015-04-19 (×3): qty 0.15

## 2015-04-19 MED ORDER — LISINOPRIL 10 MG PO TABS
10.0000 mg | ORAL_TABLET | Freq: Every day | ORAL | Status: DC
  Administered 2015-04-19: 10 mg via ORAL
  Filled 2015-04-19 (×2): qty 1

## 2015-04-19 MED ORDER — DICYCLOMINE HCL 10 MG PO CAPS
10.0000 mg | ORAL_CAPSULE | Freq: Four times a day (QID) | ORAL | Status: DC | PRN
  Administered 2015-04-19: 10 mg via ORAL
  Filled 2015-04-19 (×2): qty 1

## 2015-04-19 MED ORDER — ENOXAPARIN SODIUM 40 MG/0.4ML ~~LOC~~ SOLN
40.0000 mg | SUBCUTANEOUS | Status: DC
  Administered 2015-04-19 – 2015-04-20 (×2): 40 mg via SUBCUTANEOUS
  Filled 2015-04-19 (×2): qty 0.4

## 2015-04-19 MED ORDER — INSULIN ASPART 100 UNIT/ML ~~LOC~~ SOLN
0.0000 [IU] | Freq: Three times a day (TID) | SUBCUTANEOUS | Status: DC
  Administered 2015-04-19: 3 [IU] via SUBCUTANEOUS
  Administered 2015-04-19: 7 [IU] via SUBCUTANEOUS
  Administered 2015-04-20: 1 [IU] via SUBCUTANEOUS

## 2015-04-19 MED ORDER — DRONABINOL 2.5 MG PO CAPS
10.0000 mg | ORAL_CAPSULE | Freq: Two times a day (BID) | ORAL | Status: DC
  Administered 2015-04-19 – 2015-04-20 (×4): 10 mg via ORAL
  Filled 2015-04-19 (×4): qty 4

## 2015-04-19 MED ORDER — FERROUS SULFATE 325 (65 FE) MG PO TABS
325.0000 mg | ORAL_TABLET | Freq: Every day | ORAL | Status: DC
  Administered 2015-04-19 – 2015-04-20 (×2): 325 mg via ORAL
  Filled 2015-04-19 (×2): qty 1

## 2015-04-19 MED ORDER — ACETAMINOPHEN 650 MG RE SUPP: 650.0000 mg | Freq: Four times a day (QID) | RECTAL | Status: DC | PRN

## 2015-04-19 MED ORDER — LORAZEPAM 2 MG/ML IJ SOLN
0.2500 mg | Freq: Once | INTRAMUSCULAR | Status: AC
  Administered 2015-04-19: 0.25 mg via INTRAVENOUS

## 2015-04-19 MED ORDER — CIPROFLOXACIN IN D5W 400 MG/200ML IV SOLN
400.0000 mg | Freq: Two times a day (BID) | INTRAVENOUS | Status: DC
  Administered 2015-04-19 – 2015-04-20 (×2): 400 mg via INTRAVENOUS
  Filled 2015-04-19 (×2): qty 200

## 2015-04-19 MED ORDER — SODIUM CHLORIDE 0.9 % IV SOLN
INTRAVENOUS | Status: DC
  Administered 2015-04-19 (×2): via INTRAVENOUS

## 2015-04-19 MED ORDER — ONDANSETRON HCL 4 MG PO TABS: 4.0000 mg | ORAL_TABLET | Freq: Four times a day (QID) | ORAL | Status: DC | PRN

## 2015-04-19 MED ORDER — PREGABALIN 75 MG PO CAPS
150.0000 mg | ORAL_CAPSULE | Freq: Two times a day (BID) | ORAL | Status: DC
  Administered 2015-04-19 – 2015-04-20 (×4): 150 mg via ORAL
  Filled 2015-04-19 (×4): qty 2

## 2015-04-19 MED ORDER — LORAZEPAM 2 MG/ML IJ SOLN
INTRAMUSCULAR | Status: AC
  Filled 2015-04-19: qty 1

## 2015-04-19 MED ORDER — LORAZEPAM 2 MG/ML IJ SOLN
0.2500 mg | Freq: Three times a day (TID) | INTRAMUSCULAR | Status: DC | PRN
  Administered 2015-04-19: 0.25 mg via INTRAVENOUS
  Filled 2015-04-19: qty 1

## 2015-04-19 MED ORDER — PROCHLORPERAZINE EDISYLATE 5 MG/ML IJ SOLN: 5.0000 mg | Freq: Four times a day (QID) | INTRAMUSCULAR | Status: DC | PRN

## 2015-04-19 NOTE — H&P (Addendum)
[facility] History and Physical  [name] EUM:[id number] DOB: 1965/07/24 DOA: 04/18/2015  Referring physician: Mr.Bowie. PCP: Barbette Merino, MD  Specialists: Patient follows up at [facility] for gastroparesis.  Chief Complaint: Nausea vomiting and abdominal pain.  HPI: [name] is a 49 y.o. female with history of diabetic gastroparesis with multiple admissions was recently admitted to [facility] last week for nausea vomiting. Patient states she started developing nausea vomiting again yesterday morning with abdominal pain which is crampy in nature and generalized. Patient is having normal bowel movement. Patient has been admitted for diabetic gastroparesis. Denies any chest pain or shortness of breath. Presently patient is not in DKA.   Review of Systems: As presented in the history of presenting illness, rest negative.  Past Medical History  Diagnosis Date  . Hypertension   . Cataract   . Anxiety   . Anemia     required blood transfusion  09/2014 at [facility]  . GERD (gastroesophageal reflux disease)   . Constipation 04/03/2012  . Complication of anesthesia   . PONV (postoperative nausea and vomiting)   . Asthma     IN CHILDHOOD  . Neuromuscular disorder ([place])     Diabetic neuropathy  . Gastroparesis   . Diabetic neuropathy ([place])   . History of blood product transfusion   . Diabetes mellitus     diagnosed age 42   Past Surgical History  Procedure Laterality Date  . Retinal detachment surgery    . Posterior vitrectomy and membrane peel-left eye  09/28/2002  . Posterior vitrectomy and membrane peel-right eye  03/16/2002  . Eye surgery    . Gailstones    . Esophagogastroduodenoscopy  09/27/2014    at [facility], Dr Rolan Lipa. biospy neg for celiac, neg for H pylori.   . Colonoscopy  09/27/2014    at [facility]   Social History:  reports that she has never smoked. She has never used smokeless tobacco. She reports that she does not drink alcohol or use illicit  drugs. Where does patient live home. Can patient participate in ADLs? Yes.  Allergies  Allergen Reactions  . Anesthetics, Amide Nausea And Vomiting  . Penicillins Diarrhea and Nausea And Vomiting    Has patient had a PCN reaction causing immediate rash, facial/tongue/throat swelling, SOB or lightheadedness with hypotension: No Has patient had a PCN reaction causing severe rash involving mucus membranes or skin necrosis: No Has patient had a PCN reaction that required hospitalization Yes  Has patient had a PCN reaction occurring within the last 10 years: Yes  If all of the above answers are "NO", then may proceed with Cephalosporin use.   . Buprenorphine Hcl Rash  . Encainide Nausea And Vomiting  . Metoclopramide Rash  . Morphine And Related Rash    Family History:  Family History  Problem Relation Age of Onset  . Cystic fibrosis Mother   . Hypertension Father   . Diabetes Brother   . Hypertension Maternal Grandmother       Prior to Admission medications   Medication Sig Start Date End Date Taking? Authorizing Provider  acetaminophen (TYLENOL) 325 MG tablet Take 650 mg by mouth every 6 (six) hours as needed for moderate pain (cramping).    Yes Historical Provider, MD  ALPRAZolam (XANAX) 0.25 MG tablet Take 0.25 mg by mouth 3 (three) times daily as needed for anxiety.    Yes Historical Provider, MD  dicyclomine (BENTYL) 10 MG capsule Take 10 mg by mouth 4 (four) times daily as needed. For cramping  04/14/15 04/24/15 Yes Historical Provider, MD  dronabinol (MARINOL) 10 MG capsule Take 10 mg by mouth 2 (two) times daily. 02/04/15  Yes Historical Provider, MD  ferrous sulfate 325 (65 FE) MG tablet Take 325 mg by mouth daily.    Yes Historical Provider, MD  furosemide (LASIX) 40 MG tablet Take 40 mg by mouth daily.   Yes Historical Provider, MD  insulin aspart (NOVOLOG) 100 UNIT/ML injection Inject 0-15 Units into the skin 3 (three) times daily with meals. 6-15 units three times daily  with food.  Per sliding scale 03/12/15  Yes Belkys A Regalado, MD  insulin detemir (LEVEMIR) 100 UNIT/ML injection Inject 0.15 mLs (15 Units total) into the skin at bedtime. 02/17/13  Yes Robbie Lis, MD  lisinopril (PRINIVIL,ZESTRIL) 10 MG tablet Take 10 mg by mouth daily. 10/13/14  Yes Historical Provider, MD  LORazepam (ATIVAN) 0.5 MG tablet Take 1 tablet by mouth 3 (three) times daily as needed for anxiety.  11/29/14  Yes Historical Provider, MD  mirtazapine (REMERON) 15 MG tablet Take 15 mg by mouth at bedtime.  03/19/15 04/18/15 Yes Historical Provider, MD  ondansetron (ZOFRAN ODT) 4 MG disintegrating tablet Take 1 tablet (4 mg total) by mouth every 8 (eight) hours as needed for nausea or vomiting. 04/09/15  Yes Nita Sells, MD  ondansetron (ZOFRAN) 4 MG tablet TAKE 1 OR 2 TABLETS BY MOUTH EVERY 6 HOURS AS NEEDED for n/v 03/30/15  Yes Historical Provider, MD  pantoprazole (PROTONIX) 40 MG tablet Take 40 mg by mouth 2 (two) times daily. 01/04/15  Yes Historical Provider, MD  polyethylene glycol (MIRALAX / GLYCOLAX) packet Take 17 g by mouth daily as needed. constipation 04/14/15 04/24/15 Yes Historical Provider, MD  potassium chloride SA (K-DUR,KLOR-CON) 20 MEQ tablet Take 2 tablets (40 mEq total) by mouth once. 04/09/15  Yes Nita Sells, MD  pregabalin (LYRICA) 150 MG capsule Take 1 capsule (150 mg total) by mouth 2 (two) times daily. 02/17/13  Yes Robbie Lis, MD  prochlorperazine (COMPAZINE) 25 MG suppository Place 25 mg rectally every 12 (twelve) hours as needed. n/v 04/12/15 04/19/15 Yes Historical Provider, MD    Physical Exam: Filed Vitals:   04/18/15 1954 04/19/15 0116 04/19/15 0129 04/19/15 0206  BP: 152/69 164/77    Pulse: 105 112    Temp:  98.6 F (37 C)    TempSrc:  Oral    Resp: 18 18    Height:   5\' 2"  (1.575 m)   Weight:    60.3 kg (132 lb 15 oz)  SpO2: 100% 100%       General:  Moderately built and nourished.  Eyes: Anicteric no pallor.  ENT: No  discharge from the ears eyes nose and mouth.  Neck: No mass felt.  Cardiovascular: S1 and S2 heard.  Respiratory: No rhonchi or crepitations.  Abdomen: Soft nontender bowel sounds present.  Skin: No rash.  Musculoskeletal: No edema.  Psychiatric: Appears normal.  Neurologic: Alert awake oriented to time place and person. Moves all extremities.  Labs on Admission:  Basic Metabolic Panel:  Recent Labs Lab 04/18/15 1853  NA 136  K 3.3*  CL 101  CO2 25  GLUCOSE 198*  BUN 6  CREATININE 0.70  CALCIUM 8.9   Liver Function Tests:  Recent Labs Lab 04/18/15 1853  AST 18  ALT 12*  ALKPHOS 82  BILITOT 1.2  PROT 6.8  ALBUMIN 3.5    Recent Labs Lab 04/18/15 1853  LIPASE 15   No results for  input(s): AMMONIA in the last 168 hours. CBC:  Recent Labs Lab 04/18/15 1853  WBC 10.7*  HGB 10.3*  HCT 31.1*  MCV 87.9  PLT 303   Cardiac Enzymes: No results for input(s): CKTOTAL, CKMB, CKMBINDEX, TROPONINI in the last 168 hours.  BNP (last 3 results) No results for input(s): BNP in the last 8760 hours.  ProBNP (last 3 results) No results for input(s): PROBNP in the last 8760 hours.  CBG:  Recent Labs Lab 04/18/15 2141  GLUCAP 269*    Radiological Exams on Admission: Dg Abd Acute W/chest  04/19/2015  CLINICAL DATA:  Acute onset of generalized abdominal pain, nausea and vomiting. Initial encounter. EXAM: DG ABDOMEN ACUTE W/ 1V CHEST COMPARISON:  Chest and abdominal radiographs performed 04/04/2015 FINDINGS: The lungs are well-aerated. Mild peribronchial thickening is noted. There is no evidence of focal opacification, pleural effusion or pneumothorax. The cardiomediastinal silhouette is within normal limits. The visualized bowel gas pattern is unremarkable. Scattered stool and air are seen within the colon; there is no evidence of small bowel dilatation to suggest obstruction. No free intra-abdominal air is identified on the provided upright view. Clips are  noted within the right upper quadrant, reflecting prior cholecystectomy. No acute osseous abnormalities are seen; the sacroiliac joints are unremarkable in appearance. IMPRESSION: 1. Unremarkable bowel gas pattern; no free intra-abdominal air seen. Small amount of stool noted in the colon. 2. Mild peribronchial thickening noted.  Lungs otherwise clear. Electronically Signed   By: Garald Balding M.D.   On: 04/19/2015 00:00     Assessment/Plan Principal Problem:   Intractable nausea and vomiting Active Problems:   Diabetic gastroparesis (HCC)   Gastroparesis   Type 1 diabetes, uncontrolled, with gastroparesis (HCC)   UTI (lower urinary tract infection)   1. Intractable nausea vomiting with history of diabetic gastroparesis - at this time I have placed patient on when necessary IV Compazine. Patient is allergic to Reglan. Continue with IV hydration and clear liquid diet for now. 2. Diabetes mellitus type I uncontrolled last hemoglobin A1c was 8.9 - patient has not taken her long-acting insulin last night so I have ordered 1 dose now with sliding scale coverage. Closely observe as patient can easily go into DKA. 3. UTI - follow urine cultures. Patient is on ceftriaxone. 4. Chronic anemia - follow CBC. 5. Hypertension - on ACE inhibitor. Holding of Lasix for now.  I have reviewed patient's old charts and labs. Personally reviewed patient's x-ray.   DVT Prophylaxis Lovenox.  Code Status: Full code.  Family Communication: Discussed with patient.  Disposition Plan: Admit to inpatient.    Luisalberto Beegle N. [facility] Pager [phone].  If 7PM-7AM, please contact night-coverage [email or web address] Password [email or web address] 04/19/2015, 2:39 AM

## 2015-04-19 NOTE — Progress Notes (Signed)
Results for PHILENA, OBEY (MRN [id number]) as of 04/19/2015 10:53  Ref. Range 04/18/2015 21:41 04/19/2015 03:13 04/19/2015 07:50  Glucose-Capillary Latest Ref Range: 65-99 mg/dL 269 (H) 336 (H) 330 (H)  Results for JAYLEAH, GARBERS (MRN [id number]) as of 04/19/2015 10:53  Ref. Range 04/18/2015 18:53 04/19/2015 03:02  Glucose Latest Ref Range: 65-99 mg/dL 198 (H) 400 (H)  Results for JYRA, LAGARES (MRN [id number]) as of 04/19/2015 10:53  Ref. Range 02/20/2015 02:18  Hemoglobin A1C Latest Ref Range: 4.8-5.6 % 8.9 (H)   On CL diet. Needs meal coverage insulin.  Recommendations: Add Novolog 3 units tidwc if pt eats > 50% meal tray. (CL diet has approx 60 g CHO)  Will continue to follow. Thank you. Lorenda Peck, RD, LDN, CDE Inpatient Diabetes Coordinator [phone]

## 2015-04-19 NOTE — Progress Notes (Signed)
ANTIBIOTIC CONSULT NOTE - INITIAL  Pharmacy Consult for Rocephin Indication: UTI  Allergies  Allergen Reactions  . Anesthetics, Amide Nausea And Vomiting  . Penicillins Diarrhea and Nausea And Vomiting    Has patient had a PCN reaction causing immediate rash, facial/tongue/throat swelling, SOB or lightheadedness with hypotension: No Has patient had a PCN reaction causing severe rash involving mucus membranes or skin necrosis: No Has patient had a PCN reaction that required hospitalization Yes  Has patient had a PCN reaction occurring within the last 10 years: Yes  If all of the above answers are "NO", then may proceed with Cephalosporin use.   . Buprenorphine Hcl Rash  . Encainide Nausea And Vomiting  . Metoclopramide Rash  . Morphine And Related Rash    Patient Measurements: Height: 5\' 2"  (157.5 cm) Weight: 132 lb 15 oz (60.3 kg) IBW/kg (Calculated) : 50.1   Vital Signs: Temp: 98.6 F (37 C) (10/25 0116) Temp Source: Oral (10/25 0116) BP: 164/77 mmHg (10/25 0116) Pulse Rate: 112 (10/25 0116) Intake/Output from previous day: 10/24 0701 - 10/25 0700 In: 1500 [IV Piggyback:1500] Out: -  Intake/Output from this shift: Total I/O In: 1500 [IV Piggyback:1500] Out: -   Labs:  Recent Labs  04/18/15 1853 04/19/15 0302  WBC 10.7* 12.3*  HGB 10.3* 9.6*  PLT 303 326  CREATININE 0.70 0.84   Estimated Creatinine Clearance: 70.8 mL/min (by C-G formula based on Cr of 0.84). No results for input(s): VANCOTROUGH, VANCOPEAK, VANCORANDOM, GENTTROUGH, GENTPEAK, GENTRANDOM, TOBRATROUGH, TOBRAPEAK, TOBRARND, AMIKACINPEAK, AMIKACINTROU, AMIKACIN in the last 72 hours.   Microbiology: Recent Results (from the past 720 hour(s))  MRSA PCR Screening     Status: None   Collection Time: 04/05/15  5:18 AM  Result Value Ref Range Status   MRSA by PCR NEGATIVE NEGATIVE Final    Comment:        The GeneXpert MRSA Assay (FDA approved for NASAL specimens only), is one component of  a comprehensive MRSA colonization surveillance program. It is not intended to diagnose MRSA infection nor to guide or monitor treatment for MRSA infections.     Medical History: Past Medical History  Diagnosis Date  . Hypertension   . Cataract   . Anxiety   . Anemia     required blood transfusion  48/2016 at [facility]  . GERD (gastroesophageal reflux disease)   . Constipation 04/03/2012  . Complication of anesthesia   . PONV (postoperative nausea and vomiting)   . Asthma     IN CHILDHOOD  . Neuromuscular disorder ([place])     Diabetic neuropathy  . Gastroparesis   . Diabetic neuropathy ([place])   . History of blood product transfusion   . Diabetes mellitus     diagnosed age 52    Medications:  Prescriptions prior to admission  Medication Sig Dispense Refill Last Dose  . acetaminophen (TYLENOL) 325 MG tablet Take 650 mg by mouth every 6 (six) hours as needed for moderate pain (cramping).    unknown  . ALPRAZolam (XANAX) 0.25 MG tablet Take 0.25 mg by mouth 3 (three) times daily as needed for anxiety.    Past Week at Unknown time  . dicyclomine (BENTYL) 10 MG capsule Take 10 mg by mouth 4 (four) times daily as needed. For cramping   Past Week at Unknown time  . dronabinol (MARINOL) 10 MG capsule Take 10 mg by mouth 2 (two) times daily.   Past Week at Unknown time  . ferrous sulfate 325 (65 FE) MG tablet Take  325 mg by mouth daily.    Past Week at Unknown time  . furosemide (LASIX) 40 MG tablet Take 40 mg by mouth daily.   Past Week at Unknown time  . insulin aspart (NOVOLOG) 100 UNIT/ML injection Inject 0-15 Units into the skin 3 (three) times daily with meals. 6-15 units three times daily with food.  Per sliding scale 1 vial 12 04/18/2015 at Unknown time  . insulin detemir (LEVEMIR) 100 UNIT/ML injection Inject 0.15 mLs (15 Units total) into the skin at bedtime. 10 mL 3 04/17/2015 at Unknown time  . lisinopril (PRINIVIL,ZESTRIL) 10 MG tablet Take 10 mg by mouth daily.   Past Week at  Unknown time  . LORazepam (ATIVAN) 0.5 MG tablet Take 1 tablet by mouth 3 (three) times daily as needed for anxiety.    04/17/2015 at Unknown time  . mirtazapine (REMERON) 15 MG tablet Take 15 mg by mouth at bedtime.    Past Week at Unknown time  . ondansetron (ZOFRAN ODT) 4 MG disintegrating tablet Take 1 tablet (4 mg total) by mouth every 8 (eight) hours as needed for nausea or vomiting. 30 tablet 0 unknown  . ondansetron (ZOFRAN) 4 MG tablet TAKE 1 OR 2 TABLETS BY MOUTH EVERY 6 HOURS AS NEEDED for n/v  0 Past Week at Unknown time  . pantoprazole (PROTONIX) 40 MG tablet Take 40 mg by mouth 2 (two) times daily.   Past Week at Unknown time  . polyethylene glycol (MIRALAX / GLYCOLAX) packet Take 17 g by mouth daily as needed. constipation   Past Week at Unknown time  . potassium chloride SA (K-DUR,KLOR-CON) 20 MEQ tablet Take 2 tablets (40 mEq total) by mouth once. 60 tablet 0 Past Week at Unknown time  . pregabalin (LYRICA) 150 MG capsule Take 1 capsule (150 mg total) by mouth 2 (two) times daily. 60 capsule 0 Past Week at Unknown time  . prochlorperazine (COMPAZINE) 25 MG suppository Place 25 mg rectally every 12 (twelve) hours as needed. n/v   04/17/2015 at Unknown time   Scheduled:  . cefTRIAXone (ROCEPHIN)  IV  1 g Intravenous Q24H  . dronabinol  10 mg Oral BID  . enoxaparin (LOVENOX) injection  40 mg Subcutaneous Q24H  . ferrous sulfate  325 mg Oral Daily  . insulin aspart  0-9 Units Subcutaneous TID WC  . insulin detemir  15 Units Subcutaneous QHS  . lisinopril  10 mg Oral Daily  . mirtazapine  15 mg Oral QHS  . pregabalin  150 mg Oral BID   Infusions:  . sodium chloride 125 mL/hr at 04/19/15 0249   Assessment: 49 yoF c/o n/v/abdominal pain. UA + UTI. Rocephin per Rx.  Goal of Therapy:  Treat UTI  Plan:   Rocephin 1Gm IV q24h  F/u cultures as needed  Lawana Pai R 04/19/2015,4:51 AM

## 2015-04-19 NOTE — Progress Notes (Addendum)
Patient seen and examined, reported 3-4 episodes of diarrhea after rocephin, she requested to change abx, abx changed to cipro, gi pathogen/cdiff pending. Over all feeling better want to advance diet, diet advanced to full liquid. On exam, ab soft, nontender, +bilateral lower extremity pitting edema, d/c ivf, check echo and venous doppler to r/o DVT. H/o gastroparesis: reported not able to take reglan which cause her to have slurred speech. Has  Been started on marinol and zofran since may this year. This likely explained the urine tox screen result.

## 2015-04-19 NOTE — Progress Notes (Signed)
[place] for Rocephin Indication: UTI  Allergies  Allergen Reactions  . Anesthetics, Amide Nausea And Vomiting  . Penicillins Diarrhea and Nausea And Vomiting    Has patient had a PCN reaction causing immediate rash, facial/tongue/throat swelling, SOB or lightheadedness with hypotension: No Has patient had a PCN reaction causing severe rash involving mucus membranes or skin necrosis: No Has patient had a PCN reaction that required hospitalization Yes  Has patient had a PCN reaction occurring within the last 10 years: Yes  If all of the above answers are "NO", then may proceed with Cephalosporin use.   . Buprenorphine Hcl Rash  . Encainide Nausea And Vomiting  . Metoclopramide Rash  . Morphine And Related Rash    Patient Measurements: Height: 5\' 2"  (157.5 cm) Weight: 132 lb 15 oz (60.3 kg) IBW/kg (Calculated) : 50.1   Vital Signs: Temp: 98.2 F (36.8 C) (10/25 1352) Temp Source: Oral (10/25 1352) BP: 120/64 mmHg (10/25 1352) Pulse Rate: 99 (10/25 1352) Intake/Output from previous day: 10/24 0701 - 10/25 0700 In: 2617.9 [P.O.:720; I.V.:397.9; IV Piggyback:1500] Out: -  Intake/Output from this shift: Total I/O In: 600 [P.O.:600] Out: 375 [Urine:375]  Labs:  Recent Labs  04/18/15 1853 04/19/15 0302  WBC 10.7* 12.3*  HGB 10.3* 9.6*  PLT 303 326  CREATININE 0.70 0.84   Estimated Creatinine Clearance: 70.8 mL/min (by C-G formula based on Cr of 0.84). No results for input(s): VANCOTROUGH, VANCOPEAK, VANCORANDOM, GENTTROUGH, GENTPEAK, GENTRANDOM, TOBRATROUGH, TOBRAPEAK, TOBRARND, AMIKACINPEAK, AMIKACINTROU, AMIKACIN in the last 72 hours.   Microbiology: Recent Results (from the past 720 hour(s))  MRSA PCR Screening     Status: None   Collection Time: 04/05/15  5:18 AM  Result Value Ref Range Status   MRSA by PCR NEGATIVE NEGATIVE Final    Comment:        The GeneXpert MRSA Assay (FDA approved for NASAL specimens only), is one  component of a comprehensive MRSA colonization surveillance program. It is not intended to diagnose MRSA infection nor to guide or monitor treatment for MRSA infections.   Urine culture     Status: None (Preliminary result)   Collection Time: 04/18/15  7:40 PM  Result Value Ref Range Status   Specimen Description URINE, RANDOM  Final   Special Requests NONE  Final   Culture   Final    TOO YOUNG TO READ Performed at [facility]    Report Status PENDING  Incomplete    Medical History: Past Medical History  Diagnosis Date  . Hypertension   . Cataract   . Anxiety   . Anemia     required blood transfusion  09/2014 at [facility]  . GERD (gastroesophageal reflux disease)   . Constipation 04/03/2012  . Complication of anesthesia   . PONV (postoperative nausea and vomiting)   . Asthma     IN CHILDHOOD  . Neuromuscular disorder ([place])     Diabetic neuropathy  . Gastroparesis   . Diabetic neuropathy ([place])   . History of blood product transfusion   . Diabetes mellitus     diagnosed age 33    Medications:  Prescriptions prior to admission  Medication Sig Dispense Refill Last Dose  . acetaminophen (TYLENOL) 325 MG tablet Take 650 mg by mouth every 6 (six) hours as needed for moderate pain (cramping).    unknown  . ALPRAZolam (XANAX) 0.25 MG tablet Take 0.25 mg by mouth 3 (three) times daily as needed for anxiety.  Past Week at Unknown time  . dicyclomine (BENTYL) 10 MG capsule Take 10 mg by mouth 4 (four) times daily as needed. For cramping   Past Week at Unknown time  . dronabinol (MARINOL) 10 MG capsule Take 10 mg by mouth 2 (two) times daily.   Past Week at Unknown time  . ferrous sulfate 325 (65 FE) MG tablet Take 325 mg by mouth daily.    Past Week at Unknown time  . furosemide (LASIX) 40 MG tablet Take 40 mg by mouth daily.   Past Week at Unknown time  . insulin aspart (NOVOLOG) 100 UNIT/ML injection Inject 0-15 Units into the skin 3 (three) times daily with meals. 6-15  units three times daily with food.  Per sliding scale 1 vial 12 04/18/2015 at Unknown time  . insulin detemir (LEVEMIR) 100 UNIT/ML injection Inject 0.15 mLs (15 Units total) into the skin at bedtime. 10 mL 3 04/17/2015 at Unknown time  . lisinopril (PRINIVIL,ZESTRIL) 10 MG tablet Take 10 mg by mouth daily.   Past Week at Unknown time  . LORazepam (ATIVAN) 0.5 MG tablet Take 1 tablet by mouth 3 (three) times daily as needed for anxiety.    04/17/2015 at Unknown time  . mirtazapine (REMERON) 15 MG tablet Take 15 mg by mouth at bedtime.    Past Week at Unknown time  . ondansetron (ZOFRAN ODT) 4 MG disintegrating tablet Take 1 tablet (4 mg total) by mouth every 8 (eight) hours as needed for nausea or vomiting. 30 tablet 0 unknown  . ondansetron (ZOFRAN) 4 MG tablet TAKE 1 OR 2 TABLETS BY MOUTH EVERY 6 HOURS AS NEEDED for n/v  0 Past Week at Unknown time  . pantoprazole (PROTONIX) 40 MG tablet Take 40 mg by mouth 2 (two) times daily.   Past Week at Unknown time  . polyethylene glycol (MIRALAX / GLYCOLAX) packet Take 17 g by mouth daily as needed. constipation   Past Week at Unknown time  . potassium chloride SA (K-DUR,KLOR-CON) 20 MEQ tablet Take 2 tablets (40 mEq total) by mouth once. 60 tablet 0 Past Week at Unknown time  . pregabalin (LYRICA) 150 MG capsule Take 1 capsule (150 mg total) by mouth 2 (two) times daily. 60 capsule 0 Past Week at Unknown time  . prochlorperazine (COMPAZINE) 25 MG suppository Place 25 mg rectally every 12 (twelve) hours as needed. n/v   04/17/2015 at Unknown time   Scheduled:  . antiseptic oral rinse  7 mL Mouth Rinse BID  . dronabinol  10 mg Oral BID  . enoxaparin (LOVENOX) injection  40 mg Subcutaneous Q24H  . ferrous sulfate  325 mg Oral Daily  . insulin aspart  0-9 Units Subcutaneous TID WC  . insulin detemir  15 Units Subcutaneous QHS  . lisinopril  10 mg Oral Daily  . mirtazapine  15 mg Oral QHS  . pregabalin  150 mg Oral BID   Infusions:    Assessment: 49  yoF c/o n/v/abdominal pain. UA + UTI. Urine cx pending.  She was initially started on Rocephin but is requesting antibiotic change due to multiple episodes of diarrhea.   Renal function at patient's baseline. CrCl > 70ml/min.  Goal of Therapy:  Treat UTI  Plan:   Cipro 400mg  IV q12h  F/u cultures - de-escalate antibiotic as appropriate once cx finalized  Biagio Borg 04/19/2015,6:21 PM

## 2015-04-20 ENCOUNTER — Inpatient Hospital Stay (HOSPITAL_COMMUNITY): Payer: 59

## 2015-04-20 DIAGNOSIS — R609 Edema, unspecified: Secondary | ICD-10-CM | POA: Diagnosis not present

## 2015-04-20 DIAGNOSIS — K3184 Gastroparesis: Secondary | ICD-10-CM

## 2015-04-20 DIAGNOSIS — E1143 Type 2 diabetes mellitus with diabetic autonomic (poly)neuropathy: Secondary | ICD-10-CM | POA: Diagnosis not present

## 2015-04-20 DIAGNOSIS — R1110 Vomiting, unspecified: Secondary | ICD-10-CM | POA: Diagnosis not present

## 2015-04-20 DIAGNOSIS — N390 Urinary tract infection, site not specified: Secondary | ICD-10-CM

## 2015-04-20 DIAGNOSIS — E1065 Type 1 diabetes mellitus with hyperglycemia: Secondary | ICD-10-CM

## 2015-04-20 DIAGNOSIS — R600 Localized edema: Secondary | ICD-10-CM

## 2015-04-20 DIAGNOSIS — E1043 Type 1 diabetes mellitus with diabetic autonomic (poly)neuropathy: Secondary | ICD-10-CM | POA: Diagnosis not present

## 2015-04-20 LAB — GLUCOSE, CAPILLARY
GLUCOSE-CAPILLARY: 34 mg/dL — AB (ref 65–99)
GLUCOSE-CAPILLARY: 81 mg/dL (ref 65–99)
GLUCOSE-CAPILLARY: 134 mg/dL — AB (ref 65–99)
Glucose-Capillary: 66 mg/dL (ref 65–99)
Glucose-Capillary: 79 mg/dL (ref 65–99)

## 2015-04-20 LAB — URINE CULTURE

## 2015-04-20 MED ORDER — CIPROFLOXACIN HCL 500 MG PO TABS: 500.0000 mg | ORAL_TABLET | Freq: Two times a day (BID) | ORAL | Status: DC

## 2015-04-20 MED ORDER — CIPROFLOXACIN HCL 500 MG PO TABS
500.0000 mg | ORAL_TABLET | Freq: Two times a day (BID) | ORAL | Status: DC
  Filled 2015-04-20 (×2): qty 1

## 2015-04-20 NOTE — Clinical Documentation Improvement (Signed)
Hospitalist  Can the diagnosis of "chronic anemia" be further specified?   Iron deficiency Anemia  Chronic Blood Loss Anemia, including the suspected or known cause  Anemia of chronic disease, including the associated chronic disease state  Other  Clinically Undetermined  Document any associated diagnoses/conditions.  Please exercise your independent, professional judgment when responding. A specific answer is not anticipated or expected.  Thank You,  Zoila Shutter RN, [place] [phone]

## 2015-04-20 NOTE — Discharge Summary (Signed)
Physician Discharge Summary  [name] ONG:[id number] DOB: [birth year] DOA: 04/18/2015  PCP: Barbette Merino, MD  Admit date: 04/18/2015 Discharge date: 04/20/2015  Time spent: 35 minutes  Recommendations for Outpatient Follow-up:  1. Follow-up on CBC and BMP on hospital follow-up visit 2. Patient having upcoming appointment to be evaluated for gastric stimulator  Discharge Diagnoses:  Principal Problem:   Intractable nausea and vomiting Active Problems:   Diabetic gastroparesis ([place])   Gastroparesis   Type 1 diabetes, uncontrolled, with gastroparesis ([place])   UTI (lower urinary tract infection)   Discharge Condition: Improved  Diet recommendation: Full liquids, advance slowly  Filed Weights   04/19/15 0206  Weight: 60.3 kg (132 lb 15 oz)    History of present illness:  [name] is a 49 y.o. female with history of diabetic gastroparesis with multiple admissions was recently admitted to [facility] last week for nausea vomiting. Patient states she started developing nausea vomiting again yesterday morning with abdominal pain which is crampy in nature and generalized. Patient is having normal bowel movement. Patient has been admitted for diabetic gastroparesis. Denies any chest pain or shortness of breath. Presently patient is not in DKA.   Hospital Course:  Patient is a pleasant 49 year old female with a past medical history of diabetic gastroparesis having multiple previous admissions for gastroparesis flareups. She was recently admitted to [facility] last week for diabetic gastroparesis. She presented to the emergency department on 04/19/2015 with complaints of nausea vomiting becoming progressively worse in the 3-4 days prior to this hospitalization. She was unable to tolerate by mouth intake. Initially she was made nothing by mouth, given supportive care, as needed IV anti-emetics therapy, IV fluids. By the following day she showed significant improvement as her  diet was advanced to clear liquids. Lab work revealing the presence of bacteria as well as leukocyte esterase for which she was started on empiric IV antibiotic therapy for urinary tract infection. Her diet was gradually advanced. By 04/20/2015 she was tolerating by mouth intake. She has an appointment with Dr Felicita Gage on 04/21/2015 to discuss gastric stimulator. Patient and her son expressed strong wishes to be discharged on 04/20/2015 as they are anxious to not miss their appointment with Dr Felicita Gage. She did not have further episodes of nausea and vomiting. She was discharged on 04/20/2015 in stable condition.   Discharge Exam: Filed Vitals:   04/20/15 1442  BP: 120/61  Pulse: 85  Temp: 98.8 F (37.1 C)  Resp:     General: Awake, alert, no acute distress, she is nontoxic appearing Cardiovascular: Regular rate and rhythm normal S1-S2 Respiratory: Normal respiratory effort, lungs are clear to auscultation bilaterally Abdomen: Mild generalized tenderness to palpation,  Discharge Instructions   Discharge Instructions    Call MD for:  difficulty breathing, headache or visual disturbances    Complete by:  As directed      Call MD for:  extreme fatigue    Complete by:  As directed      Call MD for:  hives    Complete by:  As directed      Call MD for:  persistant dizziness or light-headedness    Complete by:  As directed      Call MD for:  persistant nausea and vomiting    Complete by:  As directed      Call MD for:  redness, tenderness, or signs of infection (pain, swelling, redness, odor or green/yellow discharge around incision site)    Complete by:  As directed  Call MD for:  severe uncontrolled pain    Complete by:  As directed      Call MD for:  temperature >100.4    Complete by:  As directed      Call MD for:    Complete by:  As directed      Diet - low sodium heart healthy    Complete by:  As directed      Increase activity slowly    Complete by:  As directed            Current Discharge Medication List    START taking these medications   Details  ciprofloxacin (CIPRO) 500 MG tablet Take 1 tablet (500 mg total) by mouth 2 (two) times daily. Qty: 6 tablet, Refills: 0      CONTINUE these medications which have NOT CHANGED   Details  acetaminophen (TYLENOL) 325 MG tablet Take 650 mg by mouth every 6 (six) hours as needed for moderate pain (cramping).     ALPRAZolam (XANAX) 0.25 MG tablet Take 0.25 mg by mouth 3 (three) times daily as needed for anxiety.     dicyclomine (BENTYL) 10 MG capsule Take 10 mg by mouth 4 (four) times daily as needed. For cramping    dronabinol (MARINOL) 10 MG capsule Take 10 mg by mouth 2 (two) times daily.    ferrous sulfate 325 (65 FE) MG tablet Take 325 mg by mouth daily.     insulin aspart (NOVOLOG) 100 UNIT/ML injection Inject 0-15 Units into the skin 3 (three) times daily with meals. 6-15 units three times daily with food.  Per sliding scale Qty: 1 vial, Refills: 12    insulin detemir (LEVEMIR) 100 UNIT/ML injection Inject 0.15 mLs (15 Units total) into the skin at bedtime. Qty: 10 mL, Refills: 3    lisinopril (PRINIVIL,ZESTRIL) 10 MG tablet Take 10 mg by mouth daily.    mirtazapine (REMERON) 15 MG tablet Take 15 mg by mouth at bedtime.     ondansetron (ZOFRAN ODT) 4 MG disintegrating tablet Take 1 tablet (4 mg total) by mouth every 8 (eight) hours as needed for nausea or vomiting. Qty: 30 tablet, Refills: 0    pantoprazole (PROTONIX) 40 MG tablet Take 40 mg by mouth 2 (two) times daily.    polyethylene glycol (MIRALAX / GLYCOLAX) packet Take 17 g by mouth daily as needed. constipation    potassium chloride SA (K-DUR,KLOR-CON) 20 MEQ tablet Take 2 tablets (40 mEq total) by mouth once. Qty: 60 tablet, Refills: 0    pregabalin (LYRICA) 150 MG capsule Take 1 capsule (150 mg total) by mouth 2 (two) times daily. Qty: 60 capsule, Refills: 0    prochlorperazine (COMPAZINE) 25 MG suppository Place 25 mg  rectally every 12 (twelve) hours as needed. n/v      STOP taking these medications     furosemide (LASIX) 40 MG tablet      LORazepam (ATIVAN) 0.5 MG tablet      ondansetron (ZOFRAN) 4 MG tablet        Allergies  Allergen Reactions  . Anesthetics, Amide Nausea And Vomiting  . Penicillins Diarrhea and Nausea And Vomiting    Has patient had a PCN reaction causing immediate rash, facial/tongue/throat swelling, SOB or lightheadedness with hypotension: No Has patient had a PCN reaction causing severe rash involving mucus membranes or skin necrosis: No Has patient had a PCN reaction that required hospitalization Yes  Has patient had a PCN reaction occurring within the last 10 years: Yes  If all of the above answers are "NO", then may proceed with Cephalosporin use.   . Buprenorphine Hcl Rash  . Encainide Nausea And Vomiting  . Metoclopramide Rash  . Morphine And Related Rash   Follow-up Information    Follow up with [facility], MD In 1 week.   Specialty:  Internal Medicine   Contact information:   [address] [place] [address] [phone]        The results of significant diagnostics from this hospitalization (including imaging, microbiology, ancillary and laboratory) are listed below for reference.    Significant Diagnostic Studies: Ir Fluoro Guide Cv Line Right  04/07/2015  CLINICAL DATA:  Colitis, dehydration and intractable vomiting. The patient requires a central catheter. EXAM: POWER PICC LINE PLACEMENT WITH ULTRASOUND AND FLUOROSCOPIC GUIDANCE FLUOROSCOPY TIME:  6 seconds. PROCEDURE: The patient was advised of the possible risks and complications and agreed to undergo the procedure. The patient was then brought to the angiographic suite for the procedure. A time-out was performed prior to the procedure. The right arm was prepped with chlorhexidine, draped in the usual sterile fashion using maximum barrier technique (cap and mask, sterile gown, sterile gloves,  large sterile sheet, hand hygiene and cutaneous antisepsis) and infiltrated locally with 1% Lidocaine. Ultrasound demonstrated patency of the right basilic vein, and this was documented with an image. Under real-time ultrasound guidance, this vein was accessed with a 21 gauge micropuncture needle and image documentation was performed. A 0.018 wire was introduced in to the vein. Over this, a 5.0 [place] dual lumen power injectable PICC was advanced to the lower SVC/right atrial junction. Fluoroscopy during the procedure and fluoro spot radiograph confirms appropriate catheter position. The catheter was flushed and covered with a sterile dressing. Catheter length: 30 cm COMPLICATIONS: None IMPRESSION: Successful right arm power injectable PICC line placement with ultrasound and fluoroscopic guidance. The catheter is ready for use. Electronically Signed   By: Aletta Edouard M.D.   On: 04/07/2015 17:35   Ir US Guide Vasc Access Right  04/07/2015  CLINICAL DATA:  Colitis, dehydration and intractable vomiting. The patient requires a central catheter. EXAM: POWER PICC LINE PLACEMENT WITH ULTRASOUND AND FLUOROSCOPIC GUIDANCE FLUOROSCOPY TIME:  6 seconds. PROCEDURE: The patient was advised of the possible risks and complications and agreed to undergo the procedure. The patient was then brought to the angiographic suite for the procedure. A time-out was performed prior to the procedure. The right arm was prepped with chlorhexidine, draped in the usual sterile fashion using maximum barrier technique (cap and mask, sterile gown, sterile gloves, large sterile sheet, hand hygiene and cutaneous antisepsis) and infiltrated locally with 1% Lidocaine. Ultrasound demonstrated patency of the right basilic vein, and this was documented with an image. Under real-time ultrasound guidance, this vein was accessed with a 21 gauge micropuncture needle and image documentation was performed. A 0.018 wire was introduced in to the vein. Over  this, a 5.0 [place] dual lumen power injectable PICC was advanced to the lower SVC/right atrial junction. Fluoroscopy during the procedure and fluoro spot radiograph confirms appropriate catheter position. The catheter was flushed and covered with a sterile dressing. Catheter length: 30 cm COMPLICATIONS: None IMPRESSION: Successful right arm power injectable PICC line placement with ultrasound and fluoroscopic guidance. The catheter is ready for use. Electronically Signed   By: Aletta Edouard M.D.   On: 04/07/2015 17:35   Dg Abd Acute W/chest  04/19/2015  CLINICAL DATA:  Acute onset of generalized abdominal pain, nausea and vomiting. Initial  encounter. EXAM: DG ABDOMEN ACUTE W/ 1V CHEST COMPARISON:  Chest and abdominal radiographs performed 04/04/2015 FINDINGS: The lungs are well-aerated. Mild peribronchial thickening is noted. There is no evidence of focal opacification, pleural effusion or pneumothorax. The cardiomediastinal silhouette is within normal limits. The visualized bowel gas pattern is unremarkable. Scattered stool and air are seen within the colon; there is no evidence of small bowel dilatation to suggest obstruction. No free intra-abdominal air is identified on the provided upright view. Clips are noted within the right upper quadrant, reflecting prior cholecystectomy. No acute osseous abnormalities are seen; the sacroiliac joints are unremarkable in appearance. IMPRESSION: 1. Unremarkable bowel gas pattern; no free intra-abdominal air seen. Small amount of stool noted in the colon. 2. Mild peribronchial thickening noted.  Lungs otherwise clear. Electronically Signed   By: Garald Balding M.D.   On: 04/19/2015 00:00   Dg Abd Acute W/chest  04/04/2015  CLINICAL DATA:  Nausea and vomiting beginning 3 hours ago. Constipation for 5 days. History of gastroparesis. EXAM: DG ABDOMEN ACUTE W/ 1V CHEST COMPARISON:  03/21/2015 FINDINGS: Normal heart size and pulmonary vascularity. No focal airspace  disease or consolidation in the lungs. No blunting of costophrenic angles. No pneumothorax. Mediastinal contours appear intact. Scattered gas and stool in the colon. No small or large bowel distention. No free intra-abdominal air. No abnormal air-fluid levels. No radiopaque stones. Visualized bones appear intact. Surgical clips in the right upper quadrant. IMPRESSION: No evidence of active pulmonary disease. Normal nonobstructive bowel gas pattern. No gastric distention. Electronically Signed   By: Lucienne Capers M.D.   On: 04/04/2015 05:15    Microbiology: Recent Results (from the past 240 hour(s))  Urine culture     Status: None   Collection Time: 04/18/15  7:40 PM  Result Value Ref Range Status   Specimen Description URINE, RANDOM  Final   Special Requests NONE  Final   Culture   Final    >=100,000 COLONIES/mL GROUP B STREP(S.AGALACTIAE)ISOLATED TESTING AGAINST S. AGALACTIAE NOT ROUTINELY PERFORMED DUE TO PREDICTABILITY OF AMP/PEN/VAN SUSCEPTIBILITY. Performed at [facility]    Report Status 04/20/2015 FINAL  Final     Labs: Basic Metabolic Panel:  Recent Labs Lab 04/18/15 1853 04/19/15 0302  NA 136 137  K 3.3* 3.9  CL 101 105  CO2 25 16*  GLUCOSE 198* 400*  BUN 6 9  CREATININE 0.70 0.84  CALCIUM 8.9 8.1*   Liver Function Tests:  Recent Labs Lab 04/18/15 1853 04/19/15 0302  AST 18 18  ALT 12* 11*  ALKPHOS 82 78  BILITOT 1.2 1.7*  PROT 6.8 6.2*  ALBUMIN 3.5 3.2*    Recent Labs Lab 04/18/15 1853  LIPASE 15   No results for input(s): AMMONIA in the last 168 hours. CBC:  Recent Labs Lab 04/18/15 1853 04/19/15 0302  WBC 10.7* 12.3*  NEUTROABS  --  11.0*  HGB 10.3* 9.6*  HCT 31.1* 29.3*  MCV 87.9 89.3  PLT 303 326   Cardiac Enzymes: No results for input(s): CKTOTAL, CKMB, CKMBINDEX, TROPONINI in the last 168 hours. BNP: BNP (last 3 results) No results for input(s): BNP in the last 8760 hours.  ProBNP (last 3 results) No results for  input(s): PROBNP in the last 8760 hours.  CBG:  Recent Labs Lab 04/19/15 2238 04/20/15 0749 04/20/15 0752 04/20/15 0832 04/20/15 1204  GLUCAP 185* 32* 34* 81 134*       Signed:  Paxtyn Wisdom  [facility] 04/20/2015, 3:42 PM

## 2015-04-20 NOTE — Progress Notes (Signed)
*  Preliminary Results* Bilateral lower extremity venous duplex completed. Bilateral lower extremities are negative for deep vein thrombosis. There is no evidence of Baker's cyst bilaterally.  04/20/2015  Maudry Mayhew, RVT, RDCS, RDMS

## 2015-04-20 NOTE — Progress Notes (Signed)
Echocardiogram 2D Echocardiogram has been performed.  04/20/2015 4:02 PM Maudry Mayhew, RVT, RDCS, RDMS

## 2015-04-20 NOTE — Progress Notes (Signed)
Hypoglycemic Event  CBG: 32  Treatment: 15g carbs and juices with morning breakfast tray  Symptoms: weak, lightheaded per patient statement  Follow-up CBG: Time: 9563 CBG Result:81  Possible Reasons for Event: poor patient intake  Comments/MD notified: MD paged at [address]

## 2015-04-20 NOTE — Progress Notes (Signed)
Inpatient Diabetes Program Recommendations  AACE/ADA: New Consensus Statement on Inpatient Glycemic Control (2015)  Target Ranges:  Prepandial:   less than 140 mg/dL      Peak postprandial:   less than 180 mg/dL (1-2 hours)      Critically ill patients:  140 - 180 mg/dL   Review of Glycemic Control  Hypoglycemia this am (32). Adjust basal insulin.  Results for JANECIA, PALAU (MRN [id number]) as of 04/20/2015 09:55  Ref. Range 04/20/2015 07:49 04/20/2015 07:52 04/20/2015 08:32  Glucose-Capillary Latest Ref Range: 65-99 mg/dL 32 (LL) 34 (LL) 81   Inpatient Diabetes Program Recommendations:    Consider reducing Levemir to 12 units QHS. When po intake increases, add meal coverage insulin - Novolog 3 units tidwc.  Will continue to follow. Thank you. Lorenda Peck, RD, LDN, CDE Inpatient Diabetes Coordinator [phone]

## 2015-04-20 NOTE — Progress Notes (Signed)
Patient alert and oriented, pain level 0/10.  Discharge instructions and prescriptions given to patient. All questions answered.

## 2015-04-21 LAB — GI PATHOGEN PANEL BY PCR, STOOL
C difficile toxin A/B: NOT DETECTED
Campylobacter by PCR: NOT DETECTED
Cryptosporidium by PCR: NOT DETECTED
E coli (ETEC) LT/ST: NOT DETECTED
E coli (STEC): NOT DETECTED
E coli 0157 by PCR: NOT DETECTED
G lamblia by PCR: NOT DETECTED
Norovirus GI/GII: NOT DETECTED
Rotavirus A by PCR: NOT DETECTED
Salmonella by PCR: NOT DETECTED
Shigella by PCR: NOT DETECTED

## 2015-04-21 LAB — GLUCOSE, CAPILLARY: Glucose-Capillary: 32 mg/dL — CL (ref 65–99)

## 2015-04-22 ENCOUNTER — Encounter (HOSPITAL_COMMUNITY): Payer: Self-pay

## 2015-04-22 ENCOUNTER — Emergency Department (HOSPITAL_COMMUNITY)
Admission: EM | Admit: 2015-04-22 | Discharge: 2015-04-22 | Disposition: A | Discharge status: Home / Self Care | Payer: 59 | Attending: Emergency Medicine | Admitting: Emergency Medicine

## 2015-04-22 ENCOUNTER — Emergency Department (HOSPITAL_BASED_OUTPATIENT_CLINIC_OR_DEPARTMENT_OTHER)
Admission: EM | Admit: 2015-04-22 | Discharge: 2015-04-22 | Disposition: A | Discharge status: Home / Self Care | Payer: 59 | Source: Home / Self Care | Attending: Emergency Medicine | Admitting: Emergency Medicine

## 2015-04-22 ENCOUNTER — Encounter (HOSPITAL_BASED_OUTPATIENT_CLINIC_OR_DEPARTMENT_OTHER): Payer: Self-pay

## 2015-04-22 DIAGNOSIS — E162 Hypoglycemia, unspecified: Secondary | ICD-10-CM

## 2015-04-22 DIAGNOSIS — R000 Tachycardia, unspecified: Secondary | ICD-10-CM | POA: Insufficient documentation

## 2015-04-22 DIAGNOSIS — K219 Gastro-esophageal reflux disease without esophagitis: Secondary | ICD-10-CM | POA: Insufficient documentation

## 2015-04-22 DIAGNOSIS — H259 Unspecified age-related cataract: Secondary | ICD-10-CM | POA: Insufficient documentation

## 2015-04-22 DIAGNOSIS — I10 Essential (primary) hypertension: Secondary | ICD-10-CM | POA: Insufficient documentation

## 2015-04-22 DIAGNOSIS — K3184 Gastroparesis: Secondary | ICD-10-CM

## 2015-04-22 DIAGNOSIS — F329 Major depressive disorder, single episode, unspecified: Secondary | ICD-10-CM | POA: Insufficient documentation

## 2015-04-22 DIAGNOSIS — J45909 Unspecified asthma, uncomplicated: Secondary | ICD-10-CM | POA: Insufficient documentation

## 2015-04-22 DIAGNOSIS — D649 Anemia, unspecified: Secondary | ICD-10-CM | POA: Insufficient documentation

## 2015-04-22 DIAGNOSIS — Z79899 Other long term (current) drug therapy: Secondary | ICD-10-CM | POA: Insufficient documentation

## 2015-04-22 DIAGNOSIS — F419 Anxiety disorder, unspecified: Secondary | ICD-10-CM | POA: Insufficient documentation

## 2015-04-22 DIAGNOSIS — K5900 Constipation, unspecified: Secondary | ICD-10-CM | POA: Insufficient documentation

## 2015-04-22 DIAGNOSIS — E1140 Type 2 diabetes mellitus with diabetic neuropathy, unspecified: Secondary | ICD-10-CM | POA: Insufficient documentation

## 2015-04-22 DIAGNOSIS — Z792 Long term (current) use of antibiotics: Secondary | ICD-10-CM | POA: Insufficient documentation

## 2015-04-22 DIAGNOSIS — Z880 Allergy status to penicillin: Secondary | ICD-10-CM | POA: Insufficient documentation

## 2015-04-22 DIAGNOSIS — Z794 Long term (current) use of insulin: Secondary | ICD-10-CM | POA: Insufficient documentation

## 2015-04-22 LAB — URINALYSIS, ROUTINE W REFLEX MICROSCOPIC
BILIRUBIN URINE: NEGATIVE
GLUCOSE, UA: 100 mg/dL — AB
Hgb urine dipstick: NEGATIVE
Ketones, ur: 15 mg/dL — AB
Leukocytes, UA: NEGATIVE
NITRITE: NEGATIVE
PH: 5 (ref 5.0–8.0)
Protein, ur: NEGATIVE mg/dL
SPECIFIC GRAVITY, URINE: 1.008 (ref 1.005–1.030)
Urobilinogen, UA: 0.2 mg/dL (ref 0.0–1.0)

## 2015-04-22 LAB — COMPREHENSIVE METABOLIC PANEL
ALBUMIN: 3.7 g/dL (ref 3.5–5.0)
ALK PHOS: 93 U/L (ref 38–126)
ALT: 14 U/L (ref 14–54)
AST: 29 U/L (ref 15–41)
Anion gap: 13 (ref 5–15)
BILIRUBIN TOTAL: 1 mg/dL (ref 0.3–1.2)
BUN: 6 mg/dL (ref 6–20)
CO2: 26 mmol/L (ref 22–32)
CREATININE: 1.25 mg/dL — AB (ref 0.44–1.00)
Calcium: 9.4 mg/dL (ref 8.9–10.3)
Chloride: 103 mmol/L (ref 101–111)
GFR calc Af Amer: 58 mL/min — ABNORMAL LOW (ref >60)
GFR, EST NON AFRICAN AMERICAN: 50 mL/min — AB (ref >60)
GLUCOSE: 119 mg/dL — AB (ref 65–99)
POTASSIUM: 3.3 mmol/L — AB (ref 3.5–5.1)
Sodium: 142 mmol/L (ref 135–145)
TOTAL PROTEIN: 7.2 g/dL (ref 6.5–8.1)

## 2015-04-22 LAB — CBG MONITORING, ED
GLUCOSE-CAPILLARY: 104 mg/dL — AB (ref 65–99)
GLUCOSE-CAPILLARY: 101 mg/dL — AB (ref 65–99)

## 2015-04-22 LAB — LIPASE, BLOOD: Lipase: 19 U/L (ref 11–51)

## 2015-04-22 MED ORDER — PROMETHAZINE HCL 25 MG/ML IJ SOLN
25.0000 mg | Freq: Once | INTRAMUSCULAR | Status: AC
  Administered 2015-04-22: 25 mg via INTRAMUSCULAR

## 2015-04-22 MED ORDER — SODIUM CHLORIDE 0.9 % IV SOLN: 1000.0000 mL | Freq: Once | INTRAVENOUS | Status: DC

## 2015-04-22 MED ORDER — ONDANSETRON 4 MG PO TBDP
4.0000 mg | ORAL_TABLET | Freq: Once | ORAL | Status: AC
  Administered 2015-04-22: 4 mg via ORAL
  Filled 2015-04-22: qty 1

## 2015-04-22 MED ORDER — SODIUM CHLORIDE 0.9 % IV SOLN: 1000.0000 mL | INTRAVENOUS | Status: DC

## 2015-04-22 MED ORDER — PROMETHAZINE HCL 25 MG/ML IJ SOLN
25.0000 mg | Freq: Once | INTRAMUSCULAR | Status: DC
  Filled 2015-04-22: qty 1

## 2015-04-22 NOTE — ED Notes (Signed)
EMS was called by son stating that his mom need to go to the hospital because she was having leg pain due to her chronic gastroparesis, per EMS her CBG was in the 70's. Pt was refusing transport by EMS at first by son wanted her to come.

## 2015-04-22 NOTE — ED Notes (Signed)
Son at bedside.

## 2015-04-22 NOTE — ED Notes (Signed)
NP at bedside.

## 2015-04-22 NOTE — ED Notes (Signed)
At time of d/c, this writer brought pt a wheelchair and attempted to assist pt up out of bed. Pt sipping on ginger ale and states "im not ready. Earlie Server dont care if im not ready or if this (referring to ginger ale) sits on my tummy." Pt's son requested a little more time to get pt up himself.

## 2015-04-22 NOTE — ED Notes (Signed)
Bed: WA16 Expected date:  Expected time:  Means of arrival:  Comments: EMS 

## 2015-04-22 NOTE — ED Provider Notes (Signed)
CSN: [id number]     Arrival date & time 04/22/15  0134 History   First MD Initiated Contact with Patient 04/22/15 0143     No chief complaint on file.    (Consider location/radiation/quality/duration/timing/severity/associated sxs/prior Treatment) HPI Comments: This is a 49 year old female who is chronically ill, has a history of diabetes and gastroparesis .  She was just discharged from the hospital on October 25.  She states today she thinks she passed out.  She remembers checking her blood sugar and it was 30.  Next thing she knows she was calling her son to come home early.  Denies any injury from her fall.  Syncopal episode.  She has chronic lower extremity pain.  She states this is an issue.  She is having low.  Lower leg pain, she did have bilateral vascular studies.  3 days ago, which showed no Baker's cyst or DVTs. She states she takes Zofran and Marinol for her nausea.  States she took this about 9:00  The history is provided by the patient.    Past Medical History  Diagnosis Date  . Hypertension   . Cataract   . Anxiety   . Anemia     required blood transfusion  09/2014 at [facility]  . GERD (gastroesophageal reflux disease)   . Constipation 04/03/2012  . Complication of anesthesia   . PONV (postoperative nausea and vomiting)   . Asthma     IN CHILDHOOD  . Neuromuscular disorder ([place])     Diabetic neuropathy  . Gastroparesis   . Diabetic neuropathy ([place])   . History of blood product transfusion   . Diabetes mellitus     diagnosed age 26   Past Surgical History  Procedure Laterality Date  . Retinal detachment surgery    . Posterior vitrectomy and membrane peel-left eye  09/28/2002  . Posterior vitrectomy and membrane peel-right eye  03/16/2002  . Eye surgery    . Gailstones    . Esophagogastroduodenoscopy  09/27/2014    at [facility], Dr Rolan Lipa. biospy neg for celiac, neg for H pylori.   . Colonoscopy  09/27/2014    at [facility]   Family History  Problem Relation Age of  Onset  . Cystic fibrosis Mother   . Hypertension Father   . Diabetes Brother   . Hypertension Maternal Grandmother    Social History  Substance Use Topics  . Smoking status: Never Smoker   . Smokeless tobacco: Never Used  . Alcohol Use: No   OB History    No data available     Review of Systems  Constitutional: Negative for fever and chills.  Respiratory: Negative for cough and shortness of breath.   Gastrointestinal: Positive for nausea. Negative for vomiting.  Musculoskeletal: Positive for arthralgias.  Neurological: Positive for syncope. Negative for dizziness and headaches.  All other systems reviewed and are negative.     Allergies  Anesthetics, amide; Penicillins; Buprenorphine hcl; Encainide; Metoclopramide; and Morphine and related  Home Medications   Prior to Admission medications   Medication Sig Start Date End Date Taking? Authorizing Provider  mirtazapine (REMERON) 15 MG tablet Take 15 mg by mouth at bedtime.  03/19/15 04/22/15 Yes Historical Provider, MD  pantoprazole (PROTONIX) 40 MG tablet Take 40 mg by mouth 2 (two) times daily. 01/04/15  Yes Historical Provider, MD  polyethylene glycol (MIRALAX / GLYCOLAX) packet Take 17 g by mouth daily as needed. constipation 04/14/15 04/24/15 Yes Historical Provider, MD  acetaminophen (TYLENOL) 325 MG tablet Take 650 mg  by mouth every 6 (six) hours as needed for moderate pain (cramping).     Historical Provider, MD  ALPRAZolam Duanne Moron) 0.25 MG tablet Take 0.25 mg by mouth 3 (three) times daily as needed for anxiety.     Historical Provider, MD  ciprofloxacin (CIPRO) 500 MG tablet Take 1 tablet (500 mg total) by mouth 2 (two) times daily. 04/20/15   Kelvin Cellar, MD  dicyclomine (BENTYL) 10 MG capsule Take 10 mg by mouth 4 (four) times daily as needed. For cramping 04/14/15 04/24/15  Historical Provider, MD  dronabinol (MARINOL) 10 MG capsule Take 10 mg by mouth 2 (two) times daily. 02/04/15   Historical Provider, MD   ferrous sulfate 325 (65 FE) MG tablet Take 325 mg by mouth daily.     Historical Provider, MD  insulin aspart (NOVOLOG) 100 UNIT/ML injection Inject 0-15 Units into the skin 3 (three) times daily with meals. 6-15 units three times daily with food.  Per sliding scale 03/12/15   Belkys A Regalado, MD  insulin detemir (LEVEMIR) 100 UNIT/ML injection Inject 0.15 mLs (15 Units total) into the skin at bedtime. 02/17/13   Robbie Lis, MD  lisinopril (PRINIVIL,ZESTRIL) 10 MG tablet Take 10 mg by mouth daily. 10/13/14   Historical Provider, MD  ondansetron (ZOFRAN ODT) 4 MG disintegrating tablet Take 1 tablet (4 mg total) by mouth every 8 (eight) hours as needed for nausea or vomiting. 04/09/15   Nita Sells, MD  potassium chloride SA (K-DUR,KLOR-CON) 20 MEQ tablet Take 2 tablets (40 mEq total) by mouth once. 04/09/15   Nita Sells, MD  pregabalin (LYRICA) 150 MG capsule Take 1 capsule (150 mg total) by mouth 2 (two) times daily. 02/17/13   Robbie Lis, MD  prochlorperazine (COMPAZINE) 25 MG suppository Place 25 mg rectally every 12 (twelve) hours as needed. n/v 04/12/15 04/19/15  Historical Provider, MD   BP 188/95 mmHg  Pulse 105  Resp 18  SpO2 100%  LMP 04/11/2015 Physical Exam  Constitutional: She is oriented to person, place, and time. She appears well-developed and well-nourished.  HENT:  Head: Normocephalic.  Eyes: Pupils are equal, round, and reactive to light.  Neck: Normal range of motion.  Cardiovascular: Regular rhythm.  Tachycardia present.   Pulmonary/Chest: Effort normal.  Abdominal: Soft.  Musculoskeletal: Normal range of motion. She exhibits no edema or tenderness.  Neurological: She is alert and oriented to person, place, and time.  Skin: Skin is warm.  Nursing note and vitals reviewed.   ED Course  Procedures (including critical care time) Labs Review Labs Reviewed  CBG MONITORING, ED - Abnormal; Notable for the following:    Glucose-Capillary 104 (*)     All other components within normal limits    Imaging Review No results found. I have personally reviewed and evaluated these images and lab results as part of my medical decision-making.   EKG Interpretation None     On arrival to the emergency room.  Patient ambulated to the bathroom without difficulty.  Blood sugar is 104.  She'll be given Zofran for nausea and reassessed Vision has had no episodes of vomiting here.  She refuses a fluid challenge.  She's been up walking to the bathroom several times without any apparent discomfort MDM   Final diagnoses:  Hypoglycemia         Junius Creamer, NP 04/22/15 [place], MD 04/22/15 [phone]

## 2015-04-22 NOTE — ED Notes (Signed)
Unable to obtain IV access, Dr. Tomi Bamberger informed and he states to proceed with IM injection.

## 2015-04-22 NOTE — ED Notes (Signed)
Nunzio Cobbs, RN attempting US guided IV.  Up to now, pt has not had an episode of vomiting or gagging since arrival to ED.

## 2015-04-22 NOTE — ED Notes (Signed)
Pt states tonight her sugar was low when she checked it the machine read 30  Pt states she passed out at home  Pt states when she came to she was able to call her son   Pt is c/o nausea and leg pain at this time

## 2015-04-22 NOTE — ED Notes (Signed)
Pt able to tolerate PO fluids, son states they are ready to go home.

## 2015-04-22 NOTE — ED Notes (Signed)
Pt verbalizes understanding of d/c instructions and denies any further needs at this time. 

## 2015-04-22 NOTE — ED Notes (Signed)
Pt ambulated to restroom without assistance.

## 2015-04-22 NOTE — ED Provider Notes (Signed)
CSN: [id number]     Arrival date & time 04/22/15  1947 History  By signing my name below, I, Allegra Grana, attest that this documentation has been prepared under the direction and in the presence of Dorie Rank, MD.  Electronically Signed: Allegra Grana, ED Scribe. 04/22/2015. 9:05 PM.   Chief Complaint  Patient presents with  . Emesis   The history is provided by the patient. No language interpreter was used.    HPI Comments: [name] is a 49 y.o. female with a history of DM, gastroparesis and cholecystectomy who presents to the Emergency Department complaining of intermittent, moderate vomiting that began 5 hours ago. She reports 4 episodes of vomiting since the onset of symptoms. Pt also notes feeling anxious. Pt has not tried any treatments for this episode.  Pt reports multiple visits to the ED in the last several months for similar symptoms secondary to gastroparesis. She is currently being evaluated for a stimulator that will be placed in January. She has been treated with IV Atarax in the past. Pt's last PO intake was this morning. She states that her blood sugar has measured low today. Pt denies fever, cough and diarrhea.    Past Medical History  Diagnosis Date  . Hypertension   . Cataract   . Anxiety   . Anemia     required blood transfusion  09/2014 at [facility]  . GERD (gastroesophageal reflux disease)   . Constipation 04/03/2012  . Complication of anesthesia   . PONV (postoperative nausea and vomiting)   . Asthma     IN CHILDHOOD  . Neuromuscular disorder ([place])     Diabetic neuropathy  . Gastroparesis   . Diabetic neuropathy ([place])   . History of blood product transfusion   . Diabetes mellitus     diagnosed age 4   Past Surgical History  Procedure Laterality Date  . Retinal detachment surgery    . Posterior vitrectomy and membrane peel-left eye  09/28/2002  . Posterior vitrectomy and membrane peel-right eye  03/16/2002  . Eye surgery    . Gailstones    .  Esophagogastroduodenoscopy  09/27/2014    at [facility], Dr Rolan Lipa. biospy neg for celiac, neg for H pylori.   . Colonoscopy  09/27/2014    at [facility]   Family History  Problem Relation Age of Onset  . Cystic fibrosis Mother   . Hypertension Father   . Diabetes Brother   . Hypertension Maternal Grandmother    Social History  Substance Use Topics  . Smoking status: Never Smoker   . Smokeless tobacco: Never Used  . Alcohol Use: No   OB History    No data available     Review of Systems  Constitutional: Negative for fever.  Respiratory: Negative for cough.   Gastrointestinal: Positive for nausea and vomiting. Negative for diarrhea.  All other systems reviewed and are negative.   Allergies  Anesthetics, amide; Penicillins; Buprenorphine hcl; Encainide; Metoclopramide; and Morphine and related  Home Medications   Prior to Admission medications   Medication Sig Start Date End Date Taking? Authorizing Provider  acetaminophen (TYLENOL) 325 MG tablet Take 650 mg by mouth every 6 (six) hours as needed for moderate pain (cramping).     Historical Provider, MD  ALPRAZolam Duanne Moron) 0.25 MG tablet Take 0.25 mg by mouth 3 (three) times daily as needed for anxiety.     Historical Provider, MD  ciprofloxacin (CIPRO) 500 MG tablet Take 1 tablet (500 mg total) by mouth 2 (  two) times daily. 04/20/15   Kelvin Cellar, MD  dicyclomine (BENTYL) 10 MG capsule Take 10 mg by mouth 4 (four) times daily as needed. For cramping 04/14/15 04/24/15  Historical Provider, MD  dronabinol (MARINOL) 10 MG capsule Take 10 mg by mouth 2 (two) times daily. 02/04/15   Historical Provider, MD  ferrous sulfate 325 (65 FE) MG tablet Take 325 mg by mouth daily.     Historical Provider, MD  insulin aspart (NOVOLOG) 100 UNIT/ML injection Inject 0-15 Units into the skin 3 (three) times daily with meals. 6-15 units three times daily with food.  Per sliding scale 03/12/15   Belkys A Regalado, MD  insulin detemir (LEVEMIR)  100 UNIT/ML injection Inject 0.15 mLs (15 Units total) into the skin at bedtime. 02/17/13   Robbie Lis, MD  lisinopril (PRINIVIL,ZESTRIL) 10 MG tablet Take 10 mg by mouth daily. 10/13/14   Historical Provider, MD  mirtazapine (REMERON) 15 MG tablet Take 15 mg by mouth at bedtime.  03/19/15 04/22/15  Historical Provider, MD  ondansetron (ZOFRAN ODT) 4 MG disintegrating tablet Take 1 tablet (4 mg total) by mouth every 8 (eight) hours as needed for nausea or vomiting. 04/09/15   Nita Sells, MD  pantoprazole (PROTONIX) 40 MG tablet Take 40 mg by mouth 2 (two) times daily. 01/04/15   Historical Provider, MD  polyethylene glycol (MIRALAX / GLYCOLAX) packet Take 17 g by mouth daily as needed. constipation 04/14/15 04/24/15  Historical Provider, MD  potassium chloride SA (K-DUR,KLOR-CON) 20 MEQ tablet Take 2 tablets (40 mEq total) by mouth once. 04/09/15   Nita Sells, MD  pregabalin (LYRICA) 150 MG capsule Take 1 capsule (150 mg total) by mouth 2 (two) times daily. 02/17/13   Robbie Lis, MD  prochlorperazine (COMPAZINE) 25 MG suppository Place 25 mg rectally every 12 (twelve) hours as needed. n/v 04/12/15 04/19/15  Historical Provider, MD   BP 157/67 mmHg  Pulse 100  Temp(Src) 99.1 F (37.3 C)  Resp 16  Ht 5\' 2"  (1.575 m)  Wt 145 lb (65.772 kg)  BMI 26.51 kg/m2  SpO2 99%  LMP 04/11/2015 Physical Exam  Constitutional: No distress.  HENT:  Head: Normocephalic and atraumatic.  Right Ear: External ear normal.  Left Ear: External ear normal.  Eyes: Conjunctivae and EOM are normal. Right eye exhibits no discharge. Left eye exhibits no discharge. No scleral icterus.  Neck: Neck supple. No tracheal deviation present.  Cardiovascular: Normal rate, regular rhythm and intact distal pulses.   Pulmonary/Chest: Effort normal and breath sounds normal. No stridor. No respiratory distress. She has no wheezes. She has no rales.  Abdominal: Soft. Bowel sounds are normal. She exhibits no  distension. There is generalized tenderness. There is no rigidity, no rebound, no guarding and no CVA tenderness. No hernia.  Musculoskeletal: She exhibits no edema or tenderness.  Neurological: She is alert. She has normal strength. No cranial nerve deficit (no facial droop, extraocular movements intact, no slurred speech) or sensory deficit. She exhibits normal muscle tone. She displays no seizure activity. Coordination normal.  Skin: Skin is warm and dry. No rash noted.  Psychiatric: Her behavior is normal. She exhibits a depressed mood.  Nursing note and vitals reviewed.   ED Course  Procedures    DIAGNOSTIC STUDIES: Oxygen Saturation is 98% on RA, normal by my interpretation.    COORDINATION OF CARE: 9:07 PM Discussed treatment plan with pt at bedside and pt agreed to plan.   Labs Review Labs Reviewed  COMPREHENSIVE METABOLIC PANEL -  Abnormal; Notable for the following:    Potassium 3.3 (*)    Glucose, Bld 119 (*)    Creatinine, Ser 1.25 (*)    GFR calc non Af Amer 50 (*)    GFR calc Af Amer 58 (*)    All other components within normal limits  URINALYSIS, ROUTINE W REFLEX MICROSCOPIC (NOT AT [facility]) - Abnormal; Notable for the following:    Glucose, UA 100 (*)    Ketones, ur 15 (*)    All other components within normal limits  CBG MONITORING, ED - Abnormal; Notable for the following:    Glucose-Capillary 101 (*)    All other components within normal limits  LIPASE, BLOOD  CBC WITH DIFFERENTIAL/PLATELET      MDM   Final diagnoses:  Gastroparesis    The patient's laboratory tests do not show any evidence of dehydration. Potassium level is 3.3 but not significantly decreased. I will have her increase potassium-containing foods in her diet. Glucose is 119. She is not showing signs of diabetic ketoacidosis.  Unable to get blood for CBC  Patient has a complex medical history with recurrent episodes of abdominal pain and vomiting. He has not had any further episodes of  vomiting here in the emergency room. She was given Phenergan and she has tolerated oral fluids. We were unable to get IV access.  I personally performed the services described in this documentation, which was scribed in my presence.  The recorded information has been reviewed and is accurate.     Dorie Rank, MD 04/22/15 2219

## 2015-04-22 NOTE — Discharge Instructions (Signed)
Gastroparesis °Gastroparesis, also called delayed gastric emptying, is a condition in which food takes longer than normal to empty from the stomach. The condition is usually long-lasting (chronic). °CAUSES °This condition may be caused by: °· An endocrine disorder, such as hypothyroidism or diabetes. Diabetes is the most common cause of this condition. °· A nervous system disease, such as Parkinson disease or multiple sclerosis. °· Cancer, infection, or surgery of the stomach or vagus nerve. °· A connective tissue disorder, such as scleroderma. °· Certain medicines. °In most cases, the cause is not known. °RISK FACTORS °This condition is more likely to develop in: °· People with certain disorders, including endocrine disorders, eating disorders, amyloidosis, and scleroderma. °· People with certain diseases, including Parkinson disease or multiple sclerosis. °· People with cancer or infection of the stomach or vagus nerve. °· People who have had surgery on the stomach or vagus nerve. °· People who take certain medicines. °· Women. °SYMPTOMS °Symptoms of this condition include: °· An early feeling of fullness when eating. °· Nausea. °· Weight loss. °· Vomiting. °· Heartburn. °· Abdominal bloating. °· Inconsistent blood glucose levels. °· Lack of appetite. °· Acid from the stomach coming up into the esophagus (gastroesophageal reflux). °· Spasms of the stomach. °Symptoms may come and go. °DIAGNOSIS °This condition is diagnosed with tests, such as: °· Tests that check how long it takes food to move through the stomach and intestines. These tests include: °¨ Upper gastrointestinal (GI) series. In this test, X-rays of the intestines are taken after you drink a liquid. The liquid makes the intestines show up better on the X-rays. °¨ Gastric emptying scintigraphy. In this test, scans are taken after you eat food that contains a small amount of radioactive material. °¨ Wireless capsule GI monitoring system. This test  involves swallowing a capsule that records information about movement through the stomach. °· Gastric manometry. This test measures electrical and muscular activity in the stomach. It is done with a thin tube that is passed down the throat and into the stomach. °· Endoscopy. This test checks for abnormalities in the lining of the stomach. It is done with a long, thin tube that is passed down the throat and into the stomach. °· An ultrasound. This test can help rule out gallbladder disease or pancreatitis as a cause of your symptoms. It uses sound waves to take pictures of the inside of your body. °TREATMENT °There is no cure for gastroparesis. This condition may be managed with: °· Treatment of the underlying condition causing the gastroparesis. °· Lifestyle changes, including exercise and dietary changes. Dietary changes can include: °¨ Changes in what and when you eat. °¨ Eating smaller meals more often. °¨ Eating low-fat foods. °¨ Eating low-fiber forms of high-fiber foods, such as cooked vegetables instead of raw vegetables. °¨ Having liquid foods in place of solid foods. Liquid foods are easier to digest. °· Medicines. These may be given to control nausea and vomiting and to stimulate stomach muscles. °· Getting food through a feeding tube. This may be done in severe cases. °· A gastric neurostimulator. This is a device that is inserted into the body with surgery. It helps improve stomach emptying and control nausea and vomiting. °HOME CARE INSTRUCTIONS °· Follow your health care provider's instructions about exercise and diet. °· Take medicines only as directed by your health care provider. °SEEK MEDICAL CARE IF: °· Your symptoms do not improve with treatment. °· You have new symptoms. °SEEK IMMEDIATE MEDICAL CARE IF: °· You have   severe abdominal pain that does not improve with treatment. °· You have nausea that does not go away. °· You cannot keep fluids down. °  °This information is not intended to replace  advice given to you by your health care provider. Make sure you discuss any questions you have with your health care provider. °  °Document Released: 06/11/2005 Document Revised: 10/26/2014 Document Reviewed: 06/07/2014 °[employer] Interactive Patient Education ©2016 [employer] ° °

## 2015-04-22 NOTE — ED Notes (Signed)
Pt comes in via [facility] for gastroparesis and n/v.  Per EMS pt states she vomited 4 times today despite nausea medication and states she feels dehydrated.  Pt's blood sugar is 84 by EMS and she was just discharged from [facility].

## 2015-04-22 NOTE — ED Notes (Signed)
Pt offered fluids per order. Pt offered ginger ale, water, etc. Pt refused. ED NP G. Olean Ree informed

## 2015-04-22 NOTE — ED Notes (Signed)
Son in room

## 2015-04-22 NOTE — ED Notes (Signed)
Per EMS pt c/o n/v x4 today; pt states she is a DM and unable to keep anything down. Pt was seen at [facility] last night for same, states they won't give her any dilaudid, she only needs one dose. No active vomiting noted; no distress noted

## 2015-04-22 NOTE — Discharge Instructions (Signed)
Hypoglycemia °Hypoglycemia occurs when the glucose in your blood is too low. Glucose is a type of sugar that is your body's main energy source. Hormones, such as insulin and glucagon, control the level of glucose in the blood. Insulin lowers blood glucose and glucagon increases blood glucose. Having too much insulin in your blood stream, or not eating enough food containing sugar, can result in hypoglycemia. Hypoglycemia can happen to people with or without diabetes. It can develop quickly and can be a medical emergency.  °CAUSES  °· Missing or delaying meals. °· Not eating enough carbohydrates at meals. °· Taking too much diabetes medicine. °· Not timing your oral diabetes medicine or insulin doses with meals, snacks, and exercise. °· Nausea and vomiting. °· Certain medicines. °· Severe illnesses, such as hepatitis, kidney disorders, and certain eating disorders. °· Increased activity or exercise without eating something extra or adjusting medicines. °· Drinking too much alcohol. °· A nerve disorder that affects body functions like your heart rate, blood pressure, and digestion (autonomic neuropathy). °· A condition where the stomach muscles do not function properly (gastroparesis). Therefore, medicines and food may not absorb properly. °· Rarely, a tumor of the pancreas can produce too much insulin. °SYMPTOMS  °· Hunger. °· Sweating (diaphoresis). °· Change in body temperature. °· Shakiness. °· Headache. °· Anxiety. °· Lightheadedness. °· Irritability. °· Difficulty concentrating. °· Dry mouth. °· Tingling or numbness in the hands or feet. °· Restless sleep or sleep disturbances. °· Altered speech and coordination. °· Change in mental status. °· Seizures or prolonged convulsions. °· Combativeness. °· Drowsiness (lethargic). °· Weakness. °· Increased heart rate or palpitations. °· Confusion. °· Pale, gray skin color. °· Blurred or double vision. °· Fainting. °DIAGNOSIS  °A physical exam and medical history will be  performed. Your caregiver may make a diagnosis based on your symptoms. Blood tests and other lab tests may be performed to confirm a diagnosis. Once the diagnosis is made, your caregiver will see if your signs and symptoms go away once your blood glucose is raised.  °TREATMENT  °Usually, you can easily treat your hypoglycemia when you notice symptoms. °· Check your blood glucose. If it is less than 70 mg/dl, take one of the following:   °¨ 3-4 glucose tablets.   °¨ ½ cup juice.   °¨ ½ cup regular soda.   °¨ 1 cup skim milk.   °¨ ½-1 tube of glucose gel.   °¨ 5-6 hard candies.   °· Avoid high-fat drinks or food that may delay a rise in blood glucose levels. °· Do not take more than the recommended amount of sugary foods, drinks, gel, or tablets. Doing so will cause your blood glucose to go too high.   °· Wait 10-15 minutes and recheck your blood glucose. If it is still less than 70 mg/dl or below your target range, repeat treatment.   °· Eat a snack if it is more than 1 hour until your next meal.   °There may be a time when your blood glucose may go so low that you are unable to treat yourself at home when you start to notice symptoms. You may need someone to help you. You may even faint or be unable to swallow. If you cannot treat yourself, someone will need to bring you to the hospital.  °HOME CARE INSTRUCTIONS °· If you have diabetes, follow your diabetes management plan by: °¨ Taking your medicines as directed. °¨ Following your exercise plan. °¨ Following your meal plan. Do not skip meals. Eat on time. °¨ Testing your blood   glucose regularly. Check your blood glucose before and after exercise. If you exercise longer or different than usual, be sure to check blood glucose more frequently.  Wearing your medical alert jewelry that says you have diabetes.  Identify the cause of your hypoglycemia. Then, develop ways to prevent the recurrence of hypoglycemia.  Do not take a hot bath or shower right after an  insulin shot.  Always carry treatment with you. Glucose tablets are the easiest to carry.  If you are going to drink alcohol, drink it only with meals.  Tell friends or family members ways to keep you safe during a seizure. This may include removing hard or sharp objects from the area or turning you on your side.  Maintain a healthy weight. SEEK MEDICAL CARE IF:   You are having problems keeping your blood glucose in your target range.  You are having frequent episodes of hypoglycemia.  You feel you might be having side effects from your medicines.  You are not sure why your blood glucose is dropping so low.  You notice a change in vision or a new problem with your vision. SEEK IMMEDIATE MEDICAL CARE IF:   Confusion develops.  A change in mental status occurs.  The inability to swallow develops.  Fainting occurs.   This information is not intended to replace advice given to you by your health care provider. Make sure you discuss any questions you have with your health care provider.   Document Released: 06/11/2005 Document Revised: 06/16/2013 Document Reviewed: 02/15/2015 Elsevier Interactive Patient Education [employer].  take medication as directed.  Follow-up with Dr. Mcarthur Rossetti as needed

## 2015-04-26 ENCOUNTER — Emergency Department (HOSPITAL_COMMUNITY)
Admission: EM | Admit: 2015-04-26 | Discharge: 2015-04-26 | Disposition: A | Discharge status: Home / Self Care | Payer: 59 | Attending: Emergency Medicine | Admitting: Emergency Medicine

## 2015-04-26 ENCOUNTER — Encounter (HOSPITAL_COMMUNITY): Payer: Self-pay | Admitting: Emergency Medicine

## 2015-04-26 DIAGNOSIS — Z9889 Other specified postprocedural states: Secondary | ICD-10-CM | POA: Insufficient documentation

## 2015-04-26 DIAGNOSIS — Z79899 Other long term (current) drug therapy: Secondary | ICD-10-CM | POA: Insufficient documentation

## 2015-04-26 DIAGNOSIS — E1140 Type 2 diabetes mellitus with diabetic neuropathy, unspecified: Secondary | ICD-10-CM | POA: Insufficient documentation

## 2015-04-26 DIAGNOSIS — Z880 Allergy status to penicillin: Secondary | ICD-10-CM | POA: Insufficient documentation

## 2015-04-26 DIAGNOSIS — K3184 Gastroparesis: Secondary | ICD-10-CM | POA: Insufficient documentation

## 2015-04-26 DIAGNOSIS — K219 Gastro-esophageal reflux disease without esophagitis: Secondary | ICD-10-CM | POA: Insufficient documentation

## 2015-04-26 DIAGNOSIS — Z794 Long term (current) use of insulin: Secondary | ICD-10-CM | POA: Insufficient documentation

## 2015-04-26 DIAGNOSIS — J45909 Unspecified asthma, uncomplicated: Secondary | ICD-10-CM | POA: Insufficient documentation

## 2015-04-26 DIAGNOSIS — I10 Essential (primary) hypertension: Secondary | ICD-10-CM | POA: Insufficient documentation

## 2015-04-26 DIAGNOSIS — Z9849 Cataract extraction status, unspecified eye: Secondary | ICD-10-CM | POA: Insufficient documentation

## 2015-04-26 DIAGNOSIS — D649 Anemia, unspecified: Secondary | ICD-10-CM | POA: Insufficient documentation

## 2015-04-26 DIAGNOSIS — F419 Anxiety disorder, unspecified: Secondary | ICD-10-CM | POA: Insufficient documentation

## 2015-04-26 LAB — URINALYSIS, ROUTINE W REFLEX MICROSCOPIC
Bilirubin Urine: NEGATIVE
GLUCOSE, UA: NEGATIVE mg/dL
Hgb urine dipstick: NEGATIVE
KETONES UR: NEGATIVE mg/dL
LEUKOCYTES UA: NEGATIVE
NITRITE: NEGATIVE
PROTEIN: NEGATIVE mg/dL
Specific Gravity, Urine: 1.005 (ref 1.005–1.030)
UROBILINOGEN UA: 0.2 mg/dL (ref 0.0–1.0)
pH: 7 (ref 5.0–8.0)

## 2015-04-26 LAB — COMPREHENSIVE METABOLIC PANEL
ALT: 11 U/L — ABNORMAL LOW (ref 14–54)
ANION GAP: 9 (ref 5–15)
AST: 20 U/L (ref 15–41)
Albumin: 3.4 g/dL — ABNORMAL LOW (ref 3.5–5.0)
Alkaline Phosphatase: 73 U/L (ref 38–126)
BUN: <5 mg/dL — ABNORMAL LOW (ref 6–20)
CHLORIDE: 97 mmol/L — AB (ref 101–111)
CO2: 34 mmol/L — AB (ref 22–32)
Calcium: 9 mg/dL (ref 8.9–10.3)
Creatinine, Ser: 0.91 mg/dL (ref 0.44–1.00)
Glucose, Bld: 93 mg/dL (ref 65–99)
POTASSIUM: 2.8 mmol/L — AB (ref 3.5–5.1)
Sodium: 140 mmol/L (ref 135–145)
Total Bilirubin: 0.8 mg/dL (ref 0.3–1.2)
Total Protein: 6.7 g/dL (ref 6.5–8.1)

## 2015-04-26 LAB — CBC WITH DIFFERENTIAL/PLATELET
BASOS PCT: 1 %
Basophils Absolute: 0 10*3/uL (ref 0.0–0.1)
EOS PCT: 2 %
Eosinophils Absolute: 0.1 10*3/uL (ref 0.0–0.7)
HEMATOCRIT: 30.6 % — AB (ref 36.0–46.0)
Hemoglobin: 10.2 g/dL — ABNORMAL LOW (ref 12.0–15.0)
LYMPHS PCT: 22 %
Lymphs Abs: 1.2 10*3/uL (ref 0.7–4.0)
MCH: 29.2 pg (ref 26.0–34.0)
MCHC: 33.3 g/dL (ref 30.0–36.0)
MCV: 87.7 fL (ref 78.0–100.0)
MONO ABS: 0.5 10*3/uL (ref 0.1–1.0)
MONOS PCT: 9 %
NEUTROS ABS: 3.5 10*3/uL (ref 1.7–7.7)
Neutrophils Relative %: 66 %
PLATELETS: 379 10*3/uL (ref 150–400)
RBC: 3.49 MIL/uL — ABNORMAL LOW (ref 3.87–5.11)
RDW: 14.2 % (ref 11.5–15.5)
WBC: 5.3 10*3/uL (ref 4.0–10.5)

## 2015-04-26 LAB — I-STAT CG4 LACTIC ACID, ED
LACTIC ACID, VENOUS: 0.98 mmol/L (ref 0.5–2.0)
LACTIC ACID, VENOUS: 0.54 mmol/L (ref 0.5–2.0)

## 2015-04-26 LAB — CBG MONITORING, ED
GLUCOSE-CAPILLARY: 63 mg/dL — AB (ref 65–99)
Glucose-Capillary: 107 mg/dL — ABNORMAL HIGH (ref 65–99)

## 2015-04-26 LAB — LIPASE, BLOOD: LIPASE: 22 U/L (ref 11–51)

## 2015-04-26 MED ORDER — SODIUM CHLORIDE 0.9 % IV BOLUS (SEPSIS)
1000.0000 mL | Freq: Once | INTRAVENOUS | Status: AC
  Administered 2015-04-26: 1000 mL via INTRAVENOUS

## 2015-04-26 MED ORDER — LORAZEPAM 2 MG/ML IJ SOLN: 1.0000 mg | Freq: Once | INTRAMUSCULAR | Status: DC

## 2015-04-26 MED ORDER — POTASSIUM CHLORIDE CRYS ER 20 MEQ PO TBCR
40.0000 meq | EXTENDED_RELEASE_TABLET | Freq: Once | ORAL | Status: AC
  Administered 2015-04-26: 40 meq via ORAL
  Filled 2015-04-26: qty 2

## 2015-04-26 MED ORDER — PROMETHAZINE HCL 25 MG PO TABS: 25.0000 mg | ORAL_TABLET | Freq: Four times a day (QID) | ORAL | Status: DC | PRN

## 2015-04-26 MED ORDER — SODIUM CHLORIDE 0.9 % IV SOLN: Freq: Once | INTRAVENOUS | Status: DC

## 2015-04-26 MED ORDER — PROMETHAZINE HCL 25 MG PO TABS
12.5000 mg | ORAL_TABLET | Freq: Once | ORAL | Status: AC
  Administered 2015-04-26: 12.5 mg via ORAL
  Filled 2015-04-26: qty 1

## 2015-04-26 MED ORDER — PROMETHAZINE HCL 25 MG/ML IJ SOLN
25.0000 mg | Freq: Once | INTRAMUSCULAR | Status: DC
  Filled 2015-04-26: qty 1

## 2015-04-26 MED ORDER — DICYCLOMINE HCL 10 MG/ML IM SOLN
20.0000 mg | Freq: Once | INTRAMUSCULAR | Status: AC
  Administered 2015-04-26: 20 mg via INTRAMUSCULAR
  Filled 2015-04-26: qty 2

## 2015-04-26 MED ORDER — LORAZEPAM 2 MG/ML IJ SOLN
1.0000 mg | Freq: Once | INTRAMUSCULAR | Status: AC
  Administered 2015-04-26: 1 mg via INTRAVENOUS
  Filled 2015-04-26: qty 1

## 2015-04-26 MED ORDER — PROMETHAZINE HCL 25 MG/ML IJ SOLN
25.0000 mg | Freq: Once | INTRAMUSCULAR | Status: AC
  Administered 2015-04-26: 25 mg via INTRAVENOUS
  Filled 2015-04-26: qty 1

## 2015-04-26 NOTE — ED Notes (Signed)
Informed Dr. Alfonse Spruce of pt vomiting & request for phenergan.  Received V.O.

## 2015-04-26 NOTE — ED Notes (Signed)
Bed: WA02 Expected date:  Expected time:  Means of arrival:  Comments: abd pain 

## 2015-04-26 NOTE — ED Provider Notes (Signed)
CSN: [id number]     Arrival date & time 04/26/15  1028 History   First MD Initiated Contact with Patient 04/26/15 1042     Chief Complaint  Patient presents with  . Abdominal Pain     (Consider location/radiation/quality/duration/timing/severity/associated sxs/prior Treatment) HPI Comments: 49 y.o. Female with history of gastroparesis, GERD, anxiety, DM presents for vomiting and panic attack.  Patient reports that she has not been able to hold down food or drink for days.  She says this feels just like her gastroparesis and that it is causing her to have a panic attack.  She denies fever, chills,  Diarrhea.  She does report passing gas and having normal bowel movements.  Normal urinary patterns.   Past Medical History  Diagnosis Date  . Hypertension   . Cataract   . Anxiety   . Anemia     required blood transfusion  09/2014 at [facility]  . GERD (gastroesophageal reflux disease)   . Constipation 04/03/2012  . Complication of anesthesia   . PONV (postoperative nausea and vomiting)   . Asthma     IN CHILDHOOD  . Neuromuscular disorder ([place])     Diabetic neuropathy  . Gastroparesis   . Diabetic neuropathy ([place])   . History of blood product transfusion   . Diabetes mellitus     diagnosed age 78   Past Surgical History  Procedure Laterality Date  . Retinal detachment surgery    . Posterior vitrectomy and membrane peel-left eye  09/28/2002  . Posterior vitrectomy and membrane peel-right eye  03/16/2002  . Eye surgery    . Gailstones    . Esophagogastroduodenoscopy  09/27/2014    at [facility], Dr Rolan Lipa. biospy neg for celiac, neg for H pylori.   . Colonoscopy  09/27/2014    at [facility]   Family History  Problem Relation Age of Onset  . Cystic fibrosis Mother   . Hypertension Father   . Diabetes Brother   . Hypertension Maternal Grandmother    Social History  Substance Use Topics  . Smoking status: Never Smoker   . Smokeless tobacco: Never Used  . Alcohol Use: No   OB History     No data available     Review of Systems  Constitutional: Negative for fever, chills and fatigue.  HENT: Negative for congestion, postnasal drip and rhinorrhea.   Eyes: Negative for visual disturbance.  Respiratory: Negative for cough, chest tightness and shortness of breath.   Cardiovascular: Negative for chest pain and palpitations.  Gastrointestinal: Positive for nausea, vomiting and abdominal pain. Negative for diarrhea and constipation.  Genitourinary: Negative for dysuria, urgency and hematuria.  Musculoskeletal: Negative for myalgias and back pain.  Skin: Negative for rash.  Neurological: Negative for dizziness, syncope, weakness and numbness.  Psychiatric/Behavioral: The patient is nervous/anxious.       Allergies  Anesthetics, amide; Penicillins; Buprenorphine hcl; Encainide; Metoclopramide; and Morphine and related  Home Medications   Prior to Admission medications   Medication Sig Start Date End Date Taking? Authorizing Provider  ALPRAZolam (XANAX) 0.25 MG tablet Take 0.25 mg by mouth 3 (three) times daily as needed for anxiety.    Yes Historical Provider, MD  ciprofloxacin (CIPRO) 500 MG tablet Take 1 tablet (500 mg total) by mouth 2 (two) times daily. Patient taking differently: Take 500 mg by mouth 2 (two) times daily. Started 10/27 for 3 days 04/20/15  Yes Kelvin Cellar, MD  dronabinol (MARINOL) 10 MG capsule Take 10 mg by mouth 2 (two) times  daily. 02/04/15  Yes Historical Provider, MD  ferrous sulfate 325 (65 FE) MG tablet Take 325 mg by mouth daily.    Yes Historical Provider, MD  furosemide (LASIX) 40 MG tablet Take 40 mg by mouth daily.   Yes Historical Provider, MD  insulin aspart (NOVOLOG) 100 UNIT/ML injection Inject 0-15 Units into the skin 3 (three) times daily with meals. 6-15 units three times daily with food.  Per sliding scale 03/12/15  Yes Belkys A Regalado, MD  insulin detemir (LEVEMIR) 100 UNIT/ML injection Inject 0.15 mLs (15 Units total) into the  skin at bedtime. 02/17/13  Yes Robbie Lis, MD  lisinopril (PRINIVIL,ZESTRIL) 10 MG tablet Take 10 mg by mouth daily. 10/13/14  Yes Historical Provider, MD  LORazepam (ATIVAN) 1 MG tablet Take 1 mg by mouth 3 (three) times daily.   Yes Historical Provider, MD  ondansetron (ZOFRAN ODT) 4 MG disintegrating tablet Take 1 tablet (4 mg total) by mouth every 8 (eight) hours as needed for nausea or vomiting. 04/09/15  Yes Nita Sells, MD  pantoprazole (PROTONIX) 40 MG tablet Take 40 mg by mouth 2 (two) times daily. 01/04/15  Yes Historical Provider, MD  potassium chloride SA (K-DUR,KLOR-CON) 20 MEQ tablet Take 2 tablets (40 mEq total) by mouth once. Patient taking differently: Take 40 mEq by mouth daily.  04/09/15  Yes Nita Sells, MD  pregabalin (LYRICA) 150 MG capsule Take 1 capsule (150 mg total) by mouth 2 (two) times daily. 02/17/13  Yes Robbie Lis, MD  prochlorperazine (COMPAZINE) 25 MG suppository Place 25 mg rectally every 12 (twelve) hours as needed for nausea or vomiting.  04/12/15 04/26/15 Yes Historical Provider, MD  promethazine (PHENERGAN) 25 MG tablet Take 1 tablet (25 mg total) by mouth every 6 (six) hours as needed for nausea or vomiting. 04/26/15   Harvel Quale, MD   BP 166/76 mmHg  Pulse 100  Temp(Src) 97.3 F (36.3 C) (Oral)  Resp 18  SpO2 99%  LMP 04/11/2015 Physical Exam  Constitutional: She is oriented to person, place, and time. She appears well-developed and well-nourished. She appears distressed.  Hyperventilating reporting having a panic attack  HENT:  Head: Normocephalic and atraumatic.  Right Ear: External ear normal.  Left Ear: External ear normal.  Nose: Nose normal.  Mouth/Throat: Oropharynx is clear and moist. No oropharyngeal exudate.  Eyes: EOM are normal. Pupils are equal, round, and reactive to light.  Neck: Normal range of motion. Neck supple.  Cardiovascular: Normal rate, regular rhythm, normal heart sounds and intact distal pulses.    No murmur heard. Pulmonary/Chest: Effort normal. No respiratory distress. She has no wheezes. She has no rales.  Abdominal: Soft. She exhibits no distension. There is no tenderness.  When abdomen palpated while distracted no tenderness elicited  Musculoskeletal: Normal range of motion. She exhibits no edema or tenderness.  Neurological: She is alert and oriented to person, place, and time.  Skin: Skin is warm and dry. No rash noted. She is not diaphoretic.  Psychiatric: Her mood appears anxious.  Vitals reviewed.   ED Course  Procedures (including critical care time) Labs Review Labs Reviewed  CBC WITH DIFFERENTIAL/PLATELET - Abnormal; Notable for the following:    RBC 3.49 (*)    Hemoglobin 10.2 (*)    HCT 30.6 (*)    All other components within normal limits  COMPREHENSIVE METABOLIC PANEL - Abnormal; Notable for the following:    Potassium 2.8 (*)    Chloride 97 (*)    CO2 34 (*)  BUN <5 (*)    Albumin 3.4 (*)    ALT 11 (*)    All other components within normal limits  CBG MONITORING, ED - Abnormal; Notable for the following:    Glucose-Capillary 63 (*)    All other components within normal limits  CBG MONITORING, ED - Abnormal; Notable for the following:    Glucose-Capillary 107 (*)    All other components within normal limits  LIPASE, BLOOD  URINALYSIS, ROUTINE W REFLEX MICROSCOPIC (NOT AT [facility])  I-STAT CG4 LACTIC ACID, ED  I-STAT CG4 LACTIC ACID, ED    Imaging Review No results found. I have personally reviewed and evaluated these images and lab results as part of my medical decision-making.   EKG Interpretation None      MDM  Patient seen and evaluated in stable condition.  Labs with hypokalemia supplemented with oral potassium that the patient tolerated well.  Patient was given IV fluids, phenergan, bentyl, ativan with improvement in symptoms after which she tolerated PO fluids.  Before eating and drinking had a low blood sugar that improved with oral  intake.  Patient was discharged home in stable condition with prescription for phenergan and resources for outpatient help with her anxiety.  She was instructed to follow up with her physician at [facility] who manages her gastroparesis.   Final diagnoses:  Gastroparesis    1. Gastroparesis with vomiting    Harvel Quale, MD 04/26/15 2308

## 2015-04-26 NOTE — ED Notes (Signed)
Per EMS: pt c/o of chronic abd pain and vomiting.

## 2015-04-26 NOTE — Discharge Instructions (Signed)
You were seen today for your gastroparesis and vomiting.  Take your medications as prescribed.  Follow up outpatient with your gastroparesis physician.  There is a list of resources at the bottom of the instructions for follow up for possible therapy or help with your panic attacks.  Drink drinks with sugars and salts in it like clear sodas and gatorade.  Return if you are unable to tolerate fluids and food.  Gastroparesis Gastroparesis, also called delayed gastric emptying, is a condition in which food takes longer than normal to empty from the stomach. The condition is usually long-lasting (chronic). CAUSES This condition may be caused by:  An endocrine disorder, such as hypothyroidism or diabetes. Diabetes is the most common cause of this condition.  A nervous system disease, such as Parkinson disease or multiple sclerosis.  Cancer, infection, or surgery of the stomach or vagus nerve.  A connective tissue disorder, such as scleroderma.  Certain medicines. In most cases, the cause is not known. RISK FACTORS This condition is more likely to develop in:  People with certain disorders, including endocrine disorders, eating disorders, amyloidosis, and scleroderma.  People with certain diseases, including Parkinson disease or multiple sclerosis.  People with cancer or infection of the stomach or vagus nerve.  People who have had surgery on the stomach or vagus nerve.  People who take certain medicines.  Women. SYMPTOMS Symptoms of this condition include:  An early feeling of fullness when eating.  Nausea.  Weight loss.  Vomiting.  Heartburn.  Abdominal bloating.  Inconsistent blood glucose levels.  Lack of appetite.  Acid from the stomach coming up into the esophagus (gastroesophageal reflux).  Spasms of the stomach. Symptoms may come and go. DIAGNOSIS This condition is diagnosed with tests, such as:  Tests that check how long it takes food to move through the  stomach and intestines. These tests include:  Upper gastrointestinal (GI) series. In this test, X-rays of the intestines are taken after you drink a liquid. The liquid makes the intestines show up better on the X-rays.  Gastric emptying scintigraphy. In this test, scans are taken after you eat food that contains a small amount of radioactive material.  Wireless capsule GI monitoring system. This test involves swallowing a capsule that records information about movement through the stomach.  Gastric manometry. This test measures electrical and muscular activity in the stomach. It is done with a thin tube that is passed down the throat and into the stomach.  Endoscopy. This test checks for abnormalities in the lining of the stomach. It is done with a long, thin tube that is passed down the throat and into the stomach.  An ultrasound. This test can help rule out gallbladder disease or pancreatitis as a cause of your symptoms. It uses sound waves to take pictures of the inside of your body. TREATMENT There is no cure for gastroparesis. This condition may be managed with:  Treatment of the underlying condition causing the gastroparesis.  Lifestyle changes, including exercise and dietary changes. Dietary changes can include:  Changes in what and when you eat.  Eating smaller meals more often.  Eating low-fat foods.  Eating low-fiber forms of high-fiber foods, such as cooked vegetables instead of raw vegetables.  Having liquid foods in place of solid foods. Liquid foods are easier to digest.  Medicines. These may be given to control nausea and vomiting and to stimulate stomach muscles.  Getting food through a feeding tube. This may be done in severe cases.  A  gastric neurostimulator. This is a device that is inserted into the body with surgery. It helps improve stomach emptying and control nausea and vomiting. HOME CARE INSTRUCTIONS  Follow your health care provider's instructions about  exercise and diet.  Take medicines only as directed by your health care provider. SEEK MEDICAL CARE IF:  Your symptoms do not improve with treatment.  You have new symptoms. SEEK IMMEDIATE MEDICAL CARE IF:  You have severe abdominal pain that does not improve with treatment.  You have nausea that does not go away.  You cannot keep fluids down.   This information is not intended to replace advice given to you by your health care provider. Make sure you discuss any questions you have with your health care provider.   Document Released: 06/11/2005 Document Revised: 10/26/2014 Document Reviewed: 06/07/2014 Elsevier Interactive Patient Education 2016 [employer].    Emergency Department Resource Guide 1) Find a Doctor and Pay Out of Pocket Although you won't have to find out who is covered by your insurance plan, it is a good idea to ask around and get recommendations. You will then need to call the office and see if the doctor you have chosen will accept you as a new patient and what types of options they offer for patients who are self-pay. Some doctors offer discounts or will set up payment plans for their patients who do not have insurance, but you will need to ask so you aren't surprised when you get to your appointment.  2) Contact Your Local Health Department Not all health departments have doctors that can see patients for sick visits, but many do, so it is worth a call to see if yours does. If you don't know where your local health department is, you can check in your phone book. The CDC also has a tool to help you locate your state's health department, and many state websites also have listings of all of their local health departments.  3) Find a [facility] If your illness is not likely to be very severe or complicated, you may want to try a walk in clinic. These are popping up all over the country in pharmacies, drugstores, and shopping centers. They're usually staffed by  nurse practitioners or physician assistants that have been trained to treat common illnesses and complaints. They're usually fairly quick and inexpensive. However, if you have serious medical issues or chronic medical problems, these are probably not your best option.  No Primary Care Doctor: - Call Health Connect at  [phone] - they can help you locate a primary care doctor that  accepts your insurance, provides certain services, etc. - Physician Referral Service- [phone]  Chronic Pain Problems: Organization         Address  Phone   Notes  [facility]  [phone] Patients need to be referred by their primary care doctor.   Medication Assistance: Organization         Address  Phone   Notes  [facility] Medication [facility] [address] [place], [place] [address] [phone] --Must be a resident of [facility] -- Must have NO insurance coverage whatsoever (no Medicaid/ Medicare, etc.) -- The pt. MUST have a primary care doctor that directs their care regularly and follows them in the community   [facility]ist  [phone]   [employer]  [phone]    Agencies that provide inexpensive medical care: Organization  Address  Phone   Notes  [place]  [phone]   [facility]    [phone]   [facility] [place], [place] [address] [phone]   [place] [address] [place]. [address], [place] [phone]   Planned Parenthood    [phone]   [facility]    [phone]   [place] and [place] [address], [place] Phone:  [phone], Fax:  [phone] Hours of Operation:  9 am - 6 pm, M-F.  Also accepts Medicaid/Medicare and self-pay.  [facility] for [place], [address], [place] Phone: [phone], Fax: [phone]. Hours of Operation:  8:30  am - 5:30 pm, M-F.  Also accepts Medicaid and self-pay.  [facility] [place] [address] [place] Phone: [phone]   [place], [place], [place] [phone], Ext. [phone] Mondays & Thursdays: 7-9 AM.  First 15 patients are seen on a first come, first serve basis.    [place] Providers:  Organization         Address  Phone   Notes  [facility] [address], [place] [phone] Also accepts self-pay patients.  [facility] [address] [place], [place]  [phone]   [facility] [place], [address], [place] [phone]   [facility] [address] [place] [phone]   Lucianne Lei [address], [place]   [phone] Only accepts [place] Access [place] patients after they have their name applied to their card.   Self-Pay (no insurance) in [facility]:  Organization         Address  Phone   Notes  Sickle Cell Patients, [facility] [place] [phone]   [facility] [place] [phone]   [facility] [place]  [place], [place], [place] [phone]   [facility]/Dr. Osei-Bonsu  [address] [place] or [address], [place] [phone] Phone number for both [place] and [place] locations is the same.  [facility] [address], [place] [phone]   [facility] [address] [place] or [address] [phone]   [facility] [address] [place] [phone], phone; [phone], fax Sees patients 1st and 3rd Saturday of every month.  Must not qualify for public or private insurance (i.e. Medicaid, Medicare, [place] Health  Choice, Veterans' Benefits)  Household income should be no more than 200% of the poverty level The clinic cannot treat you if you are pregnant or think you are pregnant  Sexually transmitted diseases are not treated at the clinic.    Dental Care: Organization         Address  Phone  Notes  [facility] Department of [facility] [place] [phone] Accepts children up to age 44 who are enrolled in [place] or [place]; pregnant women with a Medicaid card; and children who have applied for Medicaid or  Health Choice, but were declined, whose parents can pay a reduced fee at time  of service.  [facility] Department of [facility]  [address], [place] [phone] Accepts children up to age 60 who are enrolled in [place] or [place]; pregnant women with a Medicaid card; and children who have applied for Medicaid or [place] Health Choice, but were declined, whose parents can pay a reduced fee at time of service.  [place] Adult Dental Access PROGRAM  [place] [phone] Patients are seen by appointment only. Walk-ins are not accepted. [place] will see patients 52 years of age and older. Monday - Tuesday (8am-5pm) Most Wednesdays (8:30-5pm) $30 per visit, cash only  [facility] Adult Dental Access PROGRAM  [address], [facility] [phone] Patients are seen by appointment only. Walk-ins are not accepted. [place] will see patients 93 years of age and older. One Wednesday Evening (Monthly: Volunteer Based).  $30 per visit, cash only  [place]  [phone] for adults; Children under age 54, call Graduate Pediatric Dentistry at [phone]. Children aged 55-14, please call [phone] to request a pediatric application.  Dental services are provided in all areas of dental care including fillings, crowns and bridges, complete  and partial dentures, implants, gum treatment, root canals, and extractions. Preventive care is also provided. Treatment is provided to both adults and children. Patients are selected via a lottery and there is often a waiting list.   [facility] [address] [place]  [phone] [email or web address]   [facility] [address] [place], [place] [phone], Ext. [phone] Second and Fourth Thursday of each month, opens at 6:30 AM; Clinic ends at 9 AM.  Patients are seen on a first-come first-served basis, and a limited number are seen during each clinic.   [facility]  [address] Hillard Danker [place], [place] [phone]   Eligibility Requirements You must have lived in [place], [place], or [place] counties for at least the last three months.   You cannot be eligible for state or federal sponsored [employer], including [employer], [place], or [employer].   You generally cannot be eligible for healthcare insurance through your employer.    How to apply: Eligibility screenings are held every Tuesday and Wednesday afternoon from 1:00 pm until 4:00 pm. You do not need an appointment for the interview!  [facility] [address] [place], [place]   [place]  [place]  [place]  [phone]    Behavioral Health Resources in the Community: Intensive Outpatient Programs Organization         Address  Phone  Notes  [place]. [address], [place], [place] [phone]   [facility] Outpatient [address], [place], [place]   ADS: [facility] Drug S[facility] [address], [place], [place]   [place] [address]  [place], [place] or [phone]   Substance Abuse Resources Organization          Address  Phone  Notes  [facility] Drug Services  5874064529   [place]  [phone]   [facility]   Chinita Pester  [phone]   Residential & Outpatient Substance Abuse Program  [phone]   Psychological Services Organization         Address  Phone  Notes  [facility]  [place]  [phone]   [place] [address], [place] or [phone]    Mobile Crisis Teams Organization         Address  Phone  Notes  [facility], Mobile Crisis Care Unit  [phone]   [facility]  [address] [place], [place]   [place] [address] [place] [phone]    Self-Help/Support Groups Organization         Address  Phone             Notes  [place]. of [place] - variety of support groups  [place] Call for more information  Narcotics Anonymous (NA), [facility] [address], [employer] [place]  2 meetings at this location   Special educational needs teacher         Address  Phone  Notes  [facility] [place],    [place]  [phone]   [facility]  [address] [place] [address], [place], [place]   [place] , [place] [phone] Admissions: 8am-3pm M-F  Incentives Substance [place] [address],    [place], [place] [phone]   [facility] [address] [place], [place], [place]   [facility] [address]  [place], [place]   [facility] Programs - Intensive Outpatient [address], [place], [place]   [facility] [address].,  [place], [place] [phone] or [phone]   [facility] (RTS) [address], [place], [place] Accepts Medicaid  [facility] [place] [address]  [place] [phone] Substance Abuse/Addiction Treatment   [facility] Organization         Address  Phone  Notes  [facility]  [phone]   Domenic Schwab, PhD [address] Arlis Porta [place], [place]   [phone] or [phone]   [place], [place] [phone]   [facility] [address], [place], [place] [phone] Insurance/Medicaid/sponsorship through [facility] and Families [address], [address] [place]                                    [place], [place] [phone] [place] [address], [place] [phone]    Dr. Adele Schilder  [phone]   [facility] Dept. 1) [address], [place] 2) [place] 3)  [place] [address], [place] [phone]  [phone]   [place] [phone] or [phone] (After Hours)

## 2015-04-26 NOTE — Progress Notes (Signed)
Met with pt who states Dr Jonelle Sidle is still her pcp and she saw him "about a month ago" Pt states she has no issues with getting her medications "right now" nor getting to her appointments  Pt inquired about applying for disability Cm discussed not having a DSS staff in ED but most [facility]S with admissions can see financial counselors and ED CM can provide her with DSS and Social security office contact information along with medicaid website for entering in [place] application Pt agreed to information

## 2015-04-27 ENCOUNTER — Encounter (HOSPITAL_COMMUNITY): Payer: Self-pay

## 2015-04-27 ENCOUNTER — Emergency Department (HOSPITAL_COMMUNITY)
Admission: EM | Admit: 2015-04-27 | Discharge: 2015-04-27 | Disposition: A | Discharge status: Home / Self Care | Payer: 59 | Attending: Emergency Medicine | Admitting: Emergency Medicine

## 2015-04-27 DIAGNOSIS — Z880 Allergy status to penicillin: Secondary | ICD-10-CM | POA: Insufficient documentation

## 2015-04-27 DIAGNOSIS — K5900 Constipation, unspecified: Secondary | ICD-10-CM | POA: Insufficient documentation

## 2015-04-27 DIAGNOSIS — H259 Unspecified age-related cataract: Secondary | ICD-10-CM | POA: Insufficient documentation

## 2015-04-27 DIAGNOSIS — D649 Anemia, unspecified: Secondary | ICD-10-CM | POA: Insufficient documentation

## 2015-04-27 DIAGNOSIS — E876 Hypokalemia: Secondary | ICD-10-CM

## 2015-04-27 DIAGNOSIS — K219 Gastro-esophageal reflux disease without esophagitis: Secondary | ICD-10-CM | POA: Insufficient documentation

## 2015-04-27 DIAGNOSIS — I10 Essential (primary) hypertension: Secondary | ICD-10-CM | POA: Insufficient documentation

## 2015-04-27 DIAGNOSIS — Z794 Long term (current) use of insulin: Secondary | ICD-10-CM | POA: Insufficient documentation

## 2015-04-27 DIAGNOSIS — E1143 Type 2 diabetes mellitus with diabetic autonomic (poly)neuropathy: Secondary | ICD-10-CM

## 2015-04-27 DIAGNOSIS — J45909 Unspecified asthma, uncomplicated: Secondary | ICD-10-CM | POA: Insufficient documentation

## 2015-04-27 DIAGNOSIS — Z79899 Other long term (current) drug therapy: Secondary | ICD-10-CM | POA: Insufficient documentation

## 2015-04-27 DIAGNOSIS — E1140 Type 2 diabetes mellitus with diabetic neuropathy, unspecified: Secondary | ICD-10-CM | POA: Insufficient documentation

## 2015-04-27 DIAGNOSIS — F410 Panic disorder [episodic paroxysmal anxiety] without agoraphobia: Secondary | ICD-10-CM | POA: Insufficient documentation

## 2015-04-27 DIAGNOSIS — K3184 Gastroparesis: Secondary | ICD-10-CM

## 2015-04-27 LAB — CBG MONITORING, ED: Glucose-Capillary: 356 mg/dL — ABNORMAL HIGH (ref 65–99)

## 2015-04-27 LAB — COMPREHENSIVE METABOLIC PANEL WITH GFR
ALT: 13 U/L — ABNORMAL LOW (ref 14–54)
AST: 22 U/L (ref 15–41)
Albumin: 3.2 g/dL — ABNORMAL LOW (ref 3.5–5.0)
Alkaline Phosphatase: 68 U/L (ref 38–126)
Anion gap: 14 (ref 5–15)
BUN: 11 mg/dL (ref 6–20)
CO2: 24 mmol/L (ref 22–32)
Calcium: 8.6 mg/dL — ABNORMAL LOW (ref 8.9–10.3)
Chloride: 101 mmol/L (ref 101–111)
Creatinine, Ser: 1.1 mg/dL — ABNORMAL HIGH (ref 0.44–1.00)
GFR calc Af Amer: >60 mL/min
GFR calc non Af Amer: 59 mL/min — ABNORMAL LOW
Glucose, Bld: 231 mg/dL — ABNORMAL HIGH (ref 65–99)
Potassium: 2.7 mmol/L — CL (ref 3.5–5.1)
Sodium: 139 mmol/L (ref 135–145)
Total Bilirubin: 1.7 mg/dL — ABNORMAL HIGH (ref 0.3–1.2)
Total Protein: 6.4 g/dL — ABNORMAL LOW (ref 6.5–8.1)

## 2015-04-27 LAB — CBC WITH DIFFERENTIAL/PLATELET
Basophils Absolute: 0 K/uL (ref 0.0–0.1)
Basophils Relative: 0 %
Eosinophils Absolute: 0 K/uL (ref 0.0–0.7)
Eosinophils Relative: 0 %
HCT: 30.2 % — ABNORMAL LOW (ref 36.0–46.0)
Hemoglobin: 10 g/dL — ABNORMAL LOW (ref 12.0–15.0)
Lymphocytes Relative: 12 %
Lymphs Abs: 1 K/uL (ref 0.7–4.0)
MCH: 29.2 pg (ref 26.0–34.0)
MCHC: 33.1 g/dL (ref 30.0–36.0)
MCV: 88.3 fL (ref 78.0–100.0)
Monocytes Absolute: 0.5 K/uL (ref 0.1–1.0)
Monocytes Relative: 6 %
Neutro Abs: 6.6 K/uL (ref 1.7–7.7)
Neutrophils Relative %: 82 %
Platelets: 368 K/uL (ref 150–400)
RBC: 3.42 MIL/uL — ABNORMAL LOW (ref 3.87–5.11)
RDW: 14.4 % (ref 11.5–15.5)
WBC: 8 K/uL (ref 4.0–10.5)

## 2015-04-27 LAB — URINE MICROSCOPIC-ADD ON

## 2015-04-27 LAB — URINALYSIS, ROUTINE W REFLEX MICROSCOPIC
Bilirubin Urine: NEGATIVE
Glucose, UA: >1000 mg/dL — AB
Hgb urine dipstick: NEGATIVE
Ketones, ur: >80 mg/dL — AB
Leukocytes, UA: NEGATIVE
Nitrite: NEGATIVE
Protein, ur: NEGATIVE mg/dL
Specific Gravity, Urine: 1.017 (ref 1.005–1.030)
Urobilinogen, UA: 0.2 mg/dL (ref 0.0–1.0)
pH: 6 (ref 5.0–8.0)

## 2015-04-27 LAB — LIPASE, BLOOD: Lipase: 21 U/L (ref 11–51)

## 2015-04-27 MED ORDER — POTASSIUM CHLORIDE IN NACL 20-0.9 MEQ/L-% IV SOLN: Freq: Once | INTRAVENOUS | Status: DC

## 2015-04-27 MED ORDER — POTASSIUM CHLORIDE 10 MEQ/100ML IV SOLN
10.0000 meq | Freq: Once | INTRAVENOUS | Status: AC
  Administered 2015-04-27: 10 meq via INTRAVENOUS
  Filled 2015-04-27: qty 100

## 2015-04-27 MED ORDER — SODIUM CHLORIDE 0.9 % IV BOLUS (SEPSIS)
1000.0000 mL | Freq: Once | INTRAVENOUS | Status: AC
  Administered 2015-04-27: 1000 mL via INTRAVENOUS

## 2015-04-27 MED ORDER — HYDROMORPHONE HCL 1 MG/ML IJ SOLN
1.0000 mg | Freq: Once | INTRAMUSCULAR | Status: AC
  Administered 2015-04-27: 1 mg via INTRAMUSCULAR
  Filled 2015-04-27: qty 1

## 2015-04-27 MED ORDER — DICYCLOMINE HCL 10 MG/ML IM SOLN
20.0000 mg | Freq: Once | INTRAMUSCULAR | Status: AC
  Administered 2015-04-27: 20 mg via INTRAMUSCULAR
  Filled 2015-04-27: qty 2

## 2015-04-27 MED ORDER — POTASSIUM CHLORIDE ER 10 MEQ PO TBCR: 10.0000 meq | EXTENDED_RELEASE_TABLET | Freq: Every day | ORAL | Status: DC

## 2015-04-27 MED ORDER — LORAZEPAM 2 MG/ML IJ SOLN
1.0000 mg | Freq: Once | INTRAMUSCULAR | Status: AC
  Administered 2015-04-27: 1 mg via INTRAVENOUS
  Filled 2015-04-27: qty 1

## 2015-04-27 MED ORDER — ONDANSETRON HCL 4 MG/2ML IJ SOLN
4.0000 mg | Freq: Once | INTRAMUSCULAR | Status: AC
  Administered 2015-04-27: 4 mg via INTRAVENOUS
  Filled 2015-04-27: qty 2

## 2015-04-27 MED ORDER — PROMETHAZINE HCL 25 MG/ML IJ SOLN
12.5000 mg | Freq: Once | INTRAMUSCULAR | Status: DC
  Filled 2015-04-27: qty 1

## 2015-04-27 MED ORDER — SODIUM CHLORIDE 0.9 % IV BOLUS (SEPSIS)
500.0000 mL | Freq: Once | INTRAVENOUS | Status: AC
  Administered 2015-04-27: 500 mL via INTRAVENOUS

## 2015-04-27 MED ORDER — PROMETHAZINE HCL 25 MG PO TABS: 25.0000 mg | ORAL_TABLET | Freq: Four times a day (QID) | ORAL | Status: DC | PRN

## 2015-04-27 MED ORDER — PROMETHAZINE HCL 25 MG RE SUPP: 25.0000 mg | Freq: Four times a day (QID) | RECTAL | Status: DC | PRN

## 2015-04-27 NOTE — ED Notes (Signed)
Pt's family members cursing and verbally aggressive to [facility] and security officers as they were escorted out. Escorted to the parking lot.

## 2015-04-27 NOTE — ED Notes (Signed)
Pt discharged at 1754, Pt for last 30 minutes has not left the room. Primary rn talking with family, charge asked rn if he needed anything, he told charge to come in room. Female family member said "do you have some knowledge?" charge explained that pt needed to follow up with her primary doctor and GI specialist. Female family member speaking louder talking about how "we are treating her like a normal gastroparesis case". Charge again explained pt was discharged and needed to follow up with her primary care doctor and specialist, and that we could not do anything further in the ED, but she needed to follow up with specialist. Female family member becoming louder and more agitated saying "we are treating them like they are ignorant". Primary rn stated he could handle this, charge told primary nurse pt had 5 minutes to be discharged. Female stating things like "what are you going to call the law?".  Charge left and called security to come be outside room.   Female walked out of from and came up to the charge desk, to look at charge name badge, female family member stating "You treating [place] like we some inmates and this is as hospital" . At room 23,  female family member yelling down the hall "You come right here, you come right here" attempting to challenge charge nurse. Security and [employer] escorting pt outside.

## 2015-04-27 NOTE — ED Notes (Signed)
Pt is requesting to have ultrasound guided  PIV, no RN's in department available at this time, will page IV team

## 2015-04-27 NOTE — ED Notes (Signed)
Bed: VI15 Expected date:  Expected time:  Means of arrival:  Comments: Ems- hyperglycemia

## 2015-04-27 NOTE — ED Notes (Signed)
UNABLE TO GET LABS PATIENT NEEDS A ULTRASOUND IV

## 2015-04-27 NOTE — ED Notes (Signed)
Per EMS- Patient c/o generalized abdominal pain and hyperglycemia. Patient reported that she gave herself 13 units Novolog at 0915. EMS gave Zofran 2 mg IM prior to arrival tot he ED.

## 2015-04-27 NOTE — Discharge Instructions (Signed)
Gastroparesis °Gastroparesis, also called delayed gastric emptying, is a condition in which food takes longer than normal to empty from the stomach. The condition is usually long-lasting (chronic). °CAUSES °This condition may be caused by: °· An endocrine disorder, such as hypothyroidism or diabetes. Diabetes is the most common cause of this condition. °· A nervous system disease, such as Parkinson disease or multiple sclerosis. °· Cancer, infection, or surgery of the stomach or vagus nerve. °· A connective tissue disorder, such as scleroderma. °· Certain medicines. °In most cases, the cause is not known. °RISK FACTORS °This condition is more likely to develop in: °· People with certain disorders, including endocrine disorders, eating disorders, amyloidosis, and scleroderma. °· People with certain diseases, including Parkinson disease or multiple sclerosis. °· People with cancer or infection of the stomach or vagus nerve. °· People who have had surgery on the stomach or vagus nerve. °· People who take certain medicines. °· Women. °SYMPTOMS °Symptoms of this condition include: °· An early feeling of fullness when eating. °· Nausea. °· Weight loss. °· Vomiting. °· Heartburn. °· Abdominal bloating. °· Inconsistent blood glucose levels. °· Lack of appetite. °· Acid from the stomach coming up into the esophagus (gastroesophageal reflux). °· Spasms of the stomach. °Symptoms may come and go. °DIAGNOSIS °This condition is diagnosed with tests, such as: °· Tests that check how long it takes food to move through the stomach and intestines. These tests include: °· Upper gastrointestinal (GI) series. In this test, X-rays of the intestines are taken after you drink a liquid. The liquid makes the intestines show up better on the X-rays. °· Gastric emptying scintigraphy. In this test, scans are taken after you eat food that contains a small amount of radioactive material. °· Wireless capsule GI monitoring system. This test  involves swallowing a capsule that records information about movement through the stomach. °· Gastric manometry. This test measures electrical and muscular activity in the stomach. It is done with a thin tube that is passed down the throat and into the stomach. °· Endoscopy. This test checks for abnormalities in the lining of the stomach. It is done with a long, thin tube that is passed down the throat and into the stomach. °· An ultrasound. This test can help rule out gallbladder disease or pancreatitis as a cause of your symptoms. It uses sound waves to take pictures of the inside of your body. °TREATMENT °There is no cure for gastroparesis. This condition may be managed with: °· Treatment of the underlying condition causing the gastroparesis. °· Lifestyle changes, including exercise and dietary changes. Dietary changes can include: °· Changes in what and when you eat. °· Eating smaller meals more often. °· Eating low-fat foods. °· Eating low-fiber forms of high-fiber foods, such as cooked vegetables instead of raw vegetables. °· Having liquid foods in place of solid foods. Liquid foods are easier to digest. °· Medicines. These may be given to control nausea and vomiting and to stimulate stomach muscles. °· Getting food through a feeding tube. This may be done in severe cases. °· A gastric neurostimulator. This is a device that is inserted into the body with surgery. It helps improve stomach emptying and control nausea and vomiting. °HOME CARE INSTRUCTIONS °· Follow your health care provider's instructions about exercise and diet. °· Take medicines only as directed by your health care provider. °SEEK MEDICAL CARE IF: °· Your symptoms do not improve with treatment. °· You have new symptoms. °SEEK IMMEDIATE MEDICAL CARE IF: °· You have   severe abdominal pain that does not improve with treatment.  You have nausea that does not go away.  You cannot keep fluids down.   This information is not intended to replace  advice given to you by your health care provider. Make sure you discuss any questions you have with your health care provider.   Document Released: 06/11/2005 Document Revised: 10/26/2014 Document Reviewed: 06/07/2014 Elsevier Interactive Patient Education 2016 [employer].  Hypokalemia Hypokalemia means that the amount of potassium in the blood is lower than normal.Potassium is a chemical, called an electrolyte, that helps regulate the amount of fluid in the body. It also stimulates muscle contraction and helps nerves function properly.Most of the body's potassium is inside of cells, and only a very small amount is in the blood. Because the amount in the blood is so small, minor changes can be life-threatening. CAUSES  Antibiotics.  Diarrhea or vomiting.  Using laxatives too much, which can cause diarrhea.  Chronic kidney disease.  Water pills (diuretics).  Eating disorders (bulimia).  Low magnesium level.  Sweating a lot. SIGNS AND SYMPTOMS  Weakness.  Constipation.  Fatigue.  Muscle cramps.  Mental confusion.  Skipped heartbeats or irregular heartbeat (palpitations).  Tingling or numbness. DIAGNOSIS  Your health care provider can diagnose hypokalemia with blood tests. In addition to checking your potassium level, your health care provider may also check other lab tests. TREATMENT Hypokalemia can be treated with potassium supplements taken by mouth or adjustments in your current medicines. If your potassium level is very low, you may need to get potassium through a vein (IV) and be monitored in the hospital. A diet high in potassium is also helpful. Foods high in potassium are:  Nuts, such as peanuts and pistachios.  Seeds, such as sunflower seeds and pumpkin seeds.  Peas, lentils, and lima beans.  Whole grain and bran cereals and breads.  Fresh fruit and vegetables, such as apricots, avocado, bananas, cantaloupe, kiwi, oranges, tomatoes, asparagus, and  potatoes.  Orange and tomato juices.  Red meats.  Fruit yogurt. HOME CARE INSTRUCTIONS  Take all medicines as prescribed by your health care provider.  Maintain a healthy diet by including nutritious food, such as fruits, vegetables, nuts, whole grains, and lean meats.  If you are taking a laxative, be sure to follow the directions on the label. SEEK MEDICAL CARE IF:  Your weakness gets worse.  You feel your heart pounding or racing.  You are vomiting or having diarrhea.  You are diabetic and having trouble keeping your blood glucose in the normal range. SEEK IMMEDIATE MEDICAL CARE IF:  You have chest pain, shortness of breath, or dizziness.  You are vomiting or having diarrhea for more than 2 days.  You faint. MAKE SURE YOU:   Understand these instructions.  Will watch your condition.  Will get help right away if you are not doing well or get worse.   This information is not intended to replace advice given to you by your health care provider. Make sure you discuss any questions you have with your health care provider.   Follow up with you Primary care provider tomorrow. Take phenergan as needed for nausea/vomiting. Take potassium supplement daily. Take all other home medications as prescribed. Monitor blood sugars. See above for symptoms that warrant return to the emergency department.

## 2015-04-27 NOTE — ED Notes (Signed)
Per previous RN: unable to establish PIV, pt reports she is always a very hard stick and requires IV team and ultrasound

## 2015-04-27 NOTE — ED Notes (Signed)
Pt is aware of urine sample 

## 2015-04-28 NOTE — ED Provider Notes (Signed)
CSN: [id number]     Arrival date & time 04/27/15  1019 History   First MD Initiated Contact with Patient 04/27/15 1049     Chief Complaint  Patient presents with  . Hyperglycemia  . Abdominal Pain     (Consider location/radiation/quality/duration/timing/severity/associated sxs/prior Treatment) HPI   [name] is a 49 y.o F with an extensive history of diabetic gastroparesis, GERD, anxiety presents for persistent nausea, vomiting for several days. Also c/o abdominal pain. Pt has had several visits to the ED for the same. Pt states this feels like her typical gastroparesis flare. She denies fever, chills, diarrhea, hematemesis, melena hematochezia, dysuria.   Past Medical History  Diagnosis Date  . Hypertension   . Cataract   . Anxiety   . Anemia     required blood transfusion  09/2014 at [facility]  . GERD (gastroesophageal reflux disease)   . Constipation 04/03/2012  . Complication of anesthesia   . PONV (postoperative nausea and vomiting)   . Asthma     IN CHILDHOOD  . Neuromuscular disorder ([place])     Diabetic neuropathy  . Gastroparesis   . Diabetic neuropathy ([place])   . History of blood product transfusion   . Diabetes mellitus     diagnosed age 39  . Panic attack    Past Surgical History  Procedure Laterality Date  . Retinal detachment surgery    . Posterior vitrectomy and membrane peel-left eye  09/28/2002  . Posterior vitrectomy and membrane peel-right eye  03/16/2002  . Eye surgery    . Gailstones    . Esophagogastroduodenoscopy  09/27/2014    at [facility], Dr Rolan Lipa. biospy neg for celiac, neg for H pylori.   . Colonoscopy  09/27/2014    at [facility]   Family History  Problem Relation Age of Onset  . Cystic fibrosis Mother   . Hypertension Father   . Diabetes Brother   . Hypertension Maternal Grandmother    Social History  Substance Use Topics  . Smoking status: Never Smoker   . Smokeless tobacco: Never Used  . Alcohol Use: No   OB History    No data  available     Review of Systems  All other systems reviewed and are negative.     Allergies  Anesthetics, amide; Penicillins; Buprenorphine hcl; Encainide; Metoclopramide; and Morphine and related  Home Medications   Prior to Admission medications   Medication Sig Start Date End Date Taking? Authorizing Provider  ALPRAZolam (XANAX) 0.25 MG tablet Take 0.25 mg by mouth 3 (three) times daily as needed for anxiety.    Yes Historical Provider, MD  dronabinol (MARINOL) 10 MG capsule Take 10 mg by mouth 2 (two) times daily. 02/04/15  Yes Historical Provider, MD  ferrous sulfate 325 (65 FE) MG tablet Take 325 mg by mouth daily.    Yes Historical Provider, MD  furosemide (LASIX) 40 MG tablet Take 40 mg by mouth daily.   Yes Historical Provider, MD  insulin aspart (NOVOLOG) 100 UNIT/ML injection Inject 0-15 Units into the skin 3 (three) times daily with meals. 6-15 units three times daily with food.  Per sliding scale 03/12/15  Yes Belkys A Regalado, MD  insulin detemir (LEVEMIR) 100 UNIT/ML injection Inject 0.15 mLs (15 Units total) into the skin at bedtime. 02/17/13  Yes Robbie Lis, MD  lisinopril (PRINIVIL,ZESTRIL) 10 MG tablet Take 10 mg by mouth daily. 10/13/14  Yes Historical Provider, MD  LORazepam (ATIVAN) 1 MG tablet Take 1 mg by mouth 3 (  three) times daily.   Yes Historical Provider, MD  ondansetron (ZOFRAN ODT) 4 MG disintegrating tablet Take 1 tablet (4 mg total) by mouth every 8 (eight) hours as needed for nausea or vomiting. 04/09/15  Yes Nita Sells, MD  pantoprazole (PROTONIX) 40 MG tablet Take 40 mg by mouth 2 (two) times daily. 01/04/15  Yes Historical Provider, MD  potassium chloride SA (K-DUR,KLOR-CON) 20 MEQ tablet Take 2 tablets (40 mEq total) by mouth once. Patient taking differently: Take 40 mEq by mouth daily.  04/09/15  Yes Nita Sells, MD  pregabalin (LYRICA) 150 MG capsule Take 1 capsule (150 mg total) by mouth 2 (two) times daily. 02/17/13  Yes Robbie Lis, MD  prochlorperazine (COMPAZINE) 25 MG suppository Place 25 mg rectally every 12 (twelve) hours as needed for nausea or vomiting.  04/12/15 04/27/15 Yes Historical Provider, MD  ciprofloxacin (CIPRO) 500 MG tablet Take 1 tablet (500 mg total) by mouth 2 (two) times daily. Patient not taking: Reported on 04/27/2015 04/20/15   Kelvin Cellar, MD  potassium chloride (K-DUR) 10 MEQ tablet Take 1 tablet (10 mEq total) by mouth daily. 04/27/15   Prestyn Mahn Tripp Talia Hoheisel, PA-C  promethazine (PHENERGAN) 25 MG suppository Place 1 suppository (25 mg total) rectally every 6 (six) hours as needed for nausea or vomiting. 04/27/15   Ranbir Chew Tripp Laverda Stribling, PA-C  promethazine (PHENERGAN) 25 MG tablet Take 1 tablet (25 mg total) by mouth every 6 (six) hours as needed for nausea or vomiting. 04/27/15   Mariel Gaudin Tripp Chasin [place], PA-C   BP 148/66 mmHg  Pulse 100  Temp(Src) 98.2 F (36.8 C) (Oral)  Resp 16  SpO2 100%  LMP 04/11/2015 Physical Exam  Constitutional: She is oriented to person, place, and time. She appears well-developed and well-nourished. She appears distressed.  HENT:  Head: Normocephalic and atraumatic.  Mouth/Throat: Oropharynx is clear and moist. No oropharyngeal exudate.  Eyes: Conjunctivae and EOM are normal. Pupils are equal, round, and reactive to light. Right eye exhibits no discharge. Left eye exhibits no discharge. No scleral icterus.  Neck: Normal range of motion. Neck supple.  Cardiovascular: Normal rate, regular rhythm, normal heart sounds and intact distal pulses.  Exam reveals no gallop and no friction rub.   No murmur heard. Pulmonary/Chest: Effort normal and breath sounds normal. No respiratory distress. She has no wheezes. She has no rales. She exhibits no tenderness.  Abdominal: Soft. Bowel sounds are normal. She exhibits no distension. There is tenderness ( TTP of epigastrium). There is no guarding.  Musculoskeletal: Normal range of motion. She exhibits no edema or  tenderness.  Lymphadenopathy:    She has no cervical adenopathy.  Neurological: She is alert and oriented to person, place, and time. No cranial nerve deficit.  Strength 5/5 throughout. No sensory deficits.  No gait abnormality   Skin: Skin is warm and dry. No rash noted. She is not diaphoretic. No erythema. No pallor.  Nursing note and vitals reviewed.   ED Course  Procedures (including critical care time) Labs Review Labs Reviewed  CBC WITH DIFFERENTIAL/PLATELET - Abnormal; Notable for the following:    RBC 3.42 (*)    Hemoglobin 10.0 (*)    HCT 30.2 (*)    All other components within normal limits  COMPREHENSIVE METABOLIC PANEL - Abnormal; Notable for the following:    Potassium 2.7 (*)    Glucose, Bld 231 (*)    Creatinine, Ser 1.10 (*)    Calcium 8.6 (*)    Total Protein 6.4 (*)  Albumin 3.2 (*)    ALT 13 (*)    Total Bilirubin 1.7 (*)    GFR calc non Af Amer 59 (*)    All other components within normal limits  URINALYSIS, ROUTINE W REFLEX MICROSCOPIC (NOT AT [facility]) - Abnormal; Notable for the following:    Glucose, UA >1000 (*)    Ketones, ur >80 (*)    All other components within normal limits  URINE MICROSCOPIC-ADD ON - Abnormal; Notable for the following:    Squamous Epithelial / LPF FEW (*)    All other components within normal limits  CBG MONITORING, ED - Abnormal; Notable for the following:    Glucose-Capillary 356 (*)    All other components within normal limits  LIPASE, BLOOD    Imaging Review No results found. I have personally reviewed and evaluated these images and lab results as part of my medical decision-making.   EKG Interpretation None      MDM   Final diagnoses:  Diabetic gastroparesis ([place])  Hypokalemia   Pt seen and evaluated in stable condition. Severe history of diabetic gastroparesis with multiple emergency department visits for same. Patient not actively vomiting while in ED however reports several episodes of emesis over the  past several days. Complaining of nausea and abdominal pain.   Labs show hyperglycemia and hypokalemia. Potassium repleted IV. Patient tolerated that well. Did not put patient on glucose stabilize her due to hypokalemia. Hypokalemia likely secondary to vomiting. Anion gap within normal limits. Do not suspect DKA.  PT requiring ultrasound guided IV. Given IV fluids, Protonix, Zofran, pain medication, Ativan and Bentyl. Patient with remarkable symptomatic improvement after intervention.  Due to symptomatic improvement feel the patient is stable to be discharged. Patient sent home with PR Phenergan. Patient instructed to follow up with her physician at [facility] who manages her gastroparesis.Return precautions outlined in patient discharge instructions.   Patient was discussed with and seen by Dr. Venora Maples who agrees with the treatment plan.        Dondra Spry [place], PA-C 04/28/15 [place], MD 04/28/15 [phone]

## 2015-05-14 ENCOUNTER — Emergency Department (HOSPITAL_COMMUNITY): Payer: 59

## 2015-05-14 ENCOUNTER — Encounter (HOSPITAL_COMMUNITY): Payer: Self-pay | Admitting: Oncology

## 2015-05-14 ENCOUNTER — Emergency Department (HOSPITAL_COMMUNITY)
Admission: EM | Admit: 2015-05-14 | Discharge: 2015-05-14 | Disposition: A | Discharge status: Home / Self Care | Payer: 59 | Source: Home / Self Care | Attending: Emergency Medicine | Admitting: Emergency Medicine

## 2015-05-14 DIAGNOSIS — E1010 Type 1 diabetes mellitus with ketoacidosis without coma: Secondary | ICD-10-CM | POA: Diagnosis not present

## 2015-05-14 DIAGNOSIS — R109 Unspecified abdominal pain: Secondary | ICD-10-CM

## 2015-05-14 DIAGNOSIS — R7309 Other abnormal glucose: Secondary | ICD-10-CM

## 2015-05-14 DIAGNOSIS — R748 Abnormal levels of other serum enzymes: Secondary | ICD-10-CM

## 2015-05-14 LAB — COMPREHENSIVE METABOLIC PANEL
ALBUMIN: 3.9 g/dL (ref 3.5–5.0)
ALT: 43 U/L (ref 14–54)
AST: 25 U/L (ref 15–41)
Alkaline Phosphatase: 103 U/L (ref 38–126)
Anion gap: 10 (ref 5–15)
BILIRUBIN TOTAL: 1 mg/dL (ref 0.3–1.2)
BUN: 7 mg/dL (ref 6–20)
CHLORIDE: 98 mmol/L — AB (ref 101–111)
CO2: 28 mmol/L (ref 22–32)
Calcium: 9.8 mg/dL (ref 8.9–10.3)
Creatinine, Ser: 0.85 mg/dL (ref 0.44–1.00)
GFR calc Af Amer: >60 mL/min (ref >60)
GFR calc non Af Amer: >60 mL/min (ref >60)
GLUCOSE: 256 mg/dL — AB (ref 65–99)
POTASSIUM: 3.9 mmol/L (ref 3.5–5.1)
SODIUM: 136 mmol/L (ref 135–145)
Total Protein: 7.9 g/dL (ref 6.5–8.1)

## 2015-05-14 LAB — CBC WITH DIFFERENTIAL/PLATELET
BASOS ABS: 0 10*3/uL (ref 0.0–0.1)
BASOS PCT: 0 %
EOS ABS: 0.1 10*3/uL (ref 0.0–0.7)
Eosinophils Relative: 1 %
HEMATOCRIT: 33.7 % — AB (ref 36.0–46.0)
Hemoglobin: 11 g/dL — ABNORMAL LOW (ref 12.0–15.0)
Lymphocytes Relative: 20 %
Lymphs Abs: 0.9 10*3/uL (ref 0.7–4.0)
MCH: 28.1 pg (ref 26.0–34.0)
MCHC: 32.6 g/dL (ref 30.0–36.0)
MCV: 86 fL (ref 78.0–100.0)
MONO ABS: 0.4 10*3/uL (ref 0.1–1.0)
Monocytes Relative: 9 %
NEUTROS ABS: 3.3 10*3/uL (ref 1.7–7.7)
NEUTROS PCT: 70 %
PLATELETS: 494 10*3/uL — AB (ref 150–400)
RBC: 3.92 MIL/uL (ref 3.87–5.11)
RDW: 15.2 % (ref 11.5–15.5)
WBC: 4.7 10*3/uL (ref 4.0–10.5)

## 2015-05-14 LAB — URINALYSIS, ROUTINE W REFLEX MICROSCOPIC
Bilirubin Urine: NEGATIVE
GLUCOSE, UA: 250 mg/dL — AB
Hgb urine dipstick: NEGATIVE
Ketones, ur: 15 mg/dL — AB
LEUKOCYTES UA: NEGATIVE
NITRITE: NEGATIVE
PH: 7.5 (ref 5.0–8.0)
Protein, ur: NEGATIVE mg/dL
SPECIFIC GRAVITY, URINE: 1.01 (ref 1.005–1.030)

## 2015-05-14 LAB — LIPASE, BLOOD: Lipase: 61 U/L — ABNORMAL HIGH (ref 11–51)

## 2015-05-14 LAB — CBG MONITORING, ED
GLUCOSE-CAPILLARY: 269 mg/dL — AB (ref 65–99)
GLUCOSE-CAPILLARY: 203 mg/dL — AB (ref 65–99)

## 2015-05-14 LAB — POC URINE PREG, ED: Preg Test, Ur: NEGATIVE

## 2015-05-14 MED ORDER — LORAZEPAM 2 MG/ML IJ SOLN: 1.0000 mg | Freq: Once | INTRAMUSCULAR | Status: DC

## 2015-05-14 MED ORDER — IOHEXOL 300 MG/ML  SOLN
50.0000 mL | Freq: Once | INTRAMUSCULAR | Status: AC | PRN
  Administered 2015-05-14: 50 mL via ORAL

## 2015-05-14 MED ORDER — PROMETHAZINE HCL 25 MG/ML IJ SOLN
25.0000 mg | Freq: Once | INTRAMUSCULAR | Status: DC
  Filled 2015-05-14: qty 1

## 2015-05-14 MED ORDER — PROMETHAZINE HCL 25 MG/ML IJ SOLN
25.0000 mg | Freq: Once | INTRAMUSCULAR | Status: AC
  Administered 2015-05-14: 25 mg via INTRAVENOUS

## 2015-05-14 MED ORDER — ONDANSETRON 4 MG PO TBDP: 4.0000 mg | ORAL_TABLET | Freq: Three times a day (TID) | ORAL | Status: DC | PRN

## 2015-05-14 MED ORDER — IOHEXOL 300 MG/ML  SOLN
100.0000 mL | Freq: Once | INTRAMUSCULAR | Status: AC | PRN
  Administered 2015-05-14: 100 mL via INTRAVENOUS

## 2015-05-14 MED ORDER — SODIUM CHLORIDE 0.9 % IV BOLUS (SEPSIS)
1000.0000 mL | Freq: Once | INTRAVENOUS | Status: AC
  Administered 2015-05-14: 1000 mL via INTRAVENOUS

## 2015-05-14 MED ORDER — LORAZEPAM 2 MG/ML IJ SOLN
1.0000 mg | Freq: Once | INTRAMUSCULAR | Status: AC
  Administered 2015-05-14: 1 mg via INTRAVENOUS
  Filled 2015-05-14: qty 1

## 2015-05-14 MED ORDER — ONDANSETRON HCL 4 MG/2ML IJ SOLN
4.0000 mg | INTRAMUSCULAR | Status: AC
  Administered 2015-05-14: 4 mg via INTRAVENOUS
  Filled 2015-05-14: qty 2

## 2015-05-14 NOTE — ED Notes (Signed)
Crackers and diet coke given

## 2015-05-14 NOTE — Discharge Instructions (Signed)
1. Medications: zofran for nausea, usual home medications 2. Treatment: rest, drink plenty of fluids 3. Follow Up: please followup with your primary doctor this week for discussion of your diagnoses and further evaluation after today's visit; please return to the ER for severe pain, persistent vomiting, new or worsening symptoms   Abdominal Pain, Adult Many things can cause abdominal pain. Usually, abdominal pain is not caused by a disease and will improve without treatment. It can often be observed and treated at home. Your health care provider will do a physical exam and possibly order blood tests and X-rays to help determine the seriousness of your pain. However, in many cases, more time must pass before a clear cause of the pain can be found. Before that point, your health care provider may not know if you need more testing or further treatment. HOME CARE INSTRUCTIONS Monitor your abdominal pain for any changes. The following actions may help to alleviate any discomfort you are experiencing:  Only take over-the-counter or prescription medicines as directed by your health care provider.  Do not take laxatives unless directed to do so by your health care provider.  Try a clear liquid diet (broth, tea, or water) as directed by your health care provider. Slowly move to a bland diet as tolerated. SEEK MEDICAL CARE IF:  You have unexplained abdominal pain.  You have abdominal pain associated with nausea or diarrhea.  You have pain when you urinate or have a bowel movement.  You experience abdominal pain that wakes you in the night.  You have abdominal pain that is worsened or improved by eating food.  You have abdominal pain that is worsened with eating fatty foods.  You have a fever. SEEK IMMEDIATE MEDICAL CARE IF:  Your pain does not go away within 2 hours.  You keep throwing up (vomiting).  Your pain is felt only in portions of the abdomen, such as the right side or the left  lower portion of the abdomen.  You pass bloody or black tarry stools. MAKE SURE YOU:  Understand these instructions.  Will watch your condition.  Will get help right away if you are not doing well or get worse.   This information is not intended to replace advice given to you by your health care provider. Make sure you discuss any questions you have with your health care provider.   Document Released: 03/21/2005 Document Revised: 03/02/2015 Document Reviewed: 02/18/2013 Elsevier Interactive Patient Education [employer].

## 2015-05-14 NOTE — ED Notes (Signed)
Patient still in CT.

## 2015-05-14 NOTE — ED Notes (Signed)
Bed: [id number] Expected date:  Expected time:  Means of arrival:  Comments: EMS 78F Gastroparesis

## 2015-05-14 NOTE — ED Notes (Signed)
Per EMS pt presents d/t abdominal pain, N/V and gastroparesis.   Pt seen here for same c/o.

## 2015-05-14 NOTE — ED Provider Notes (Signed)
CSN: [id number]     Arrival date & time 05/14/15  0554 History   First MD Initiated Contact with Patient 05/14/15 0606     Chief Complaint  Patient presents with  . Abdominal Pain    HPI   [name] is a 49 y.o. female with a PMH of GERD, DM, gastroparesis who presents to the ED with nausea, vomiting, and abdominal pain. She reports her symptoms started approximately 3 hours prior to arrival. She denies exacerbating or alleviating factors. She reports subjective fever and chills. She denies chest pain, shortness of breath, diarrhea, constipation, hematemesis. She states her symptoms feel characteristic of her typical gastroparesis flare. She also reports anxiety. She states she has been compliant with taking her insulin at home.    Past Medical History  Diagnosis Date  . Hypertension   . Cataract   . Anxiety   . Anemia     required blood transfusion  09/2014 at [facility]  . GERD (gastroesophageal reflux disease)   . Constipation 04/03/2012  . Complication of anesthesia   . PONV (postoperative nausea and vomiting)   . Asthma     IN CHILDHOOD  . Neuromuscular disorder ([place])     Diabetic neuropathy  . Gastroparesis   . Diabetic neuropathy ([place])   . History of blood product transfusion   . Diabetes mellitus     diagnosed age 67  . Panic attack    Past Surgical History  Procedure Laterality Date  . Retinal detachment surgery    . Posterior vitrectomy and membrane peel-left eye  09/28/2002  . Posterior vitrectomy and membrane peel-right eye  03/16/2002  . Eye surgery    . Gailstones    . Esophagogastroduodenoscopy  09/27/2014    at [facility], Dr Rolan Lipa. biospy neg for celiac, neg for H pylori.   . Colonoscopy  09/27/2014    at [facility]   Family History  Problem Relation Age of Onset  . Cystic fibrosis Mother   . Hypertension Father   . Diabetes Brother   . Hypertension Maternal Grandmother    Social History  Substance Use Topics  . Smoking status: Never Smoker   .  Smokeless tobacco: Never Used  . Alcohol Use: No   OB History    No data available      Review of Systems  Constitutional: Positive for fever and chills.  Respiratory: Negative for shortness of breath.   Cardiovascular: Negative for chest pain.  Gastrointestinal: Positive for nausea, vomiting and abdominal pain. Negative for diarrhea, constipation and blood in stool.  Psychiatric/Behavioral: The patient is nervous/anxious.   All other systems reviewed and are negative.     Allergies  Anesthetics, amide; Penicillins; Buprenorphine hcl; Encainide; Metoclopramide; and Morphine and related  Home Medications   Prior to Admission medications   Medication Sig Start Date End Date Taking? Authorizing Provider  ALPRAZolam (XANAX) 0.25 MG tablet Take 0.25 mg by mouth 3 (three) times daily as needed for anxiety.    Yes Historical Provider, MD  dronabinol (MARINOL) 10 MG capsule Take 10 mg by mouth 2 (two) times daily. 02/04/15  Yes Historical Provider, MD  ferrous sulfate 325 (65 FE) MG tablet Take 325 mg by mouth daily.    Yes Historical Provider, MD  FLUoxetine (PROZAC) 40 MG capsule Take 40 mg by mouth daily.   Yes Historical Provider, MD  furosemide (LASIX) 40 MG tablet Take 40 mg by mouth daily.   Yes Historical Provider, MD  insulin aspart (NOVOLOG) 100 UNIT/ML injection  Inject 0-15 Units into the skin 3 (three) times daily with meals. 6-15 units three times daily with food.  Per sliding scale 03/12/15  Yes Belkys A Regalado, MD  insulin detemir (LEVEMIR) 100 UNIT/ML injection Inject 0.15 mLs (15 Units total) into the skin at bedtime. 02/17/13  Yes Robbie Lis, MD  lisinopril (PRINIVIL,ZESTRIL) 10 MG tablet Take 10 mg by mouth daily. 10/13/14  Yes Historical Provider, MD  LORazepam (ATIVAN) 1 MG tablet Take 1 mg by mouth 3 (three) times daily.   Yes Historical Provider, MD  pantoprazole (PROTONIX) 40 MG tablet Take 40 mg by mouth 2 (two) times daily. 01/04/15  Yes Historical Provider, MD   pregabalin (LYRICA) 150 MG capsule Take 1 capsule (150 mg total) by mouth 2 (two) times daily. 02/17/13  Yes Robbie Lis, MD  prochlorperazine (COMPAZINE) 25 MG suppository Place 25 mg rectally every 12 (twelve) hours as needed for nausea or vomiting.  04/12/15 05/14/15 Yes Historical Provider, MD  promethazine (PHENERGAN) 25 MG suppository Place 1 suppository (25 mg total) rectally every 6 (six) hours as needed for nausea or vomiting. 04/27/15  Yes Samantha Tripp Dowless, PA-C  promethazine (PHENERGAN) 25 MG tablet Take 1 tablet (25 mg total) by mouth every 6 (six) hours as needed for nausea or vomiting. 04/27/15  Yes Samantha Tripp Dowless, PA-C  ciprofloxacin (CIPRO) 500 MG tablet Take 1 tablet (500 mg total) by mouth 2 (two) times daily. Patient not taking: Reported on 04/27/2015 04/20/15   Kelvin Cellar, MD  ondansetron (ZOFRAN ODT) 4 MG disintegrating tablet Take 1 tablet (4 mg total) by mouth every 8 (eight) hours as needed for nausea. 05/14/15   Marella Chimes, PA-C  potassium chloride (K-DUR) 10 MEQ tablet Take 1 tablet (10 mEq total) by mouth daily. Patient not taking: Reported on 05/14/2015 04/27/15   Samantha Tripp Dowless, PA-C  potassium chloride SA (K-DUR,KLOR-CON) 20 MEQ tablet Take 2 tablets (40 mEq total) by mouth once. Patient not taking: Reported on 05/14/2015 04/09/15   Nita Sells, MD    BP 177/87 mmHg  Pulse 97  Temp(Src) 98.1 F (36.7 C) (Oral)  Resp 16  Ht 5\' 2"  (1.575 m)  Wt 145 lb (65.772 kg)  BMI 26.51 kg/m2  SpO2 100%  LMP 04/11/2015 Physical Exam  Constitutional: She is oriented to person, place, and time. She appears well-developed and well-nourished. No distress.  HENT:  Head: Normocephalic and atraumatic.  Right Ear: External ear normal.  Left Ear: External ear normal.  Nose: Nose normal.  Mouth/Throat: Uvula is midline, oropharynx is clear and moist and mucous membranes are normal.  Eyes: Conjunctivae, EOM and lids are normal. Pupils are  equal, round, and reactive to light. Right eye exhibits no discharge. Left eye exhibits no discharge. No scleral icterus.  Neck: Normal range of motion. Neck supple.  Cardiovascular: Normal rate, regular rhythm, normal heart sounds, intact distal pulses and normal pulses.   Pulmonary/Chest: Effort normal and breath sounds normal. No respiratory distress.  Abdominal: Soft. Normal appearance and bowel sounds are normal. She exhibits no distension and no mass. There is tenderness. There is no rigidity, no rebound and no guarding.  Mild TTP in RUQ. No rebound, guarding, or masses.  Musculoskeletal: Normal range of motion. She exhibits no edema or tenderness.  Neurological: She is alert and oriented to person, place, and time. She has normal strength. No sensory deficit.  Skin: Skin is warm, dry and intact. No rash noted. She is not diaphoretic. No erythema. No pallor.  Psychiatric: She has a normal mood and affect. Her speech is normal and behavior is normal.  Nursing note and vitals reviewed.   ED Course  Procedures (including critical care time)  Labs Review Labs Reviewed  CBC WITH DIFFERENTIAL/PLATELET - Abnormal; Notable for the following:    Hemoglobin 11.0 (*)    HCT 33.7 (*)    Platelets 494 (*)    All other components within normal limits  COMPREHENSIVE METABOLIC PANEL - Abnormal; Notable for the following:    Chloride 98 (*)    Glucose, Bld 256 (*)    All other components within normal limits  LIPASE, BLOOD - Abnormal; Notable for the following:    Lipase 61 (*)    All other components within normal limits  URINALYSIS, ROUTINE W REFLEX MICROSCOPIC (NOT AT [facility]) - Abnormal; Notable for the following:    Glucose, UA 250 (*)    Ketones, ur 15 (*)    All other components within normal limits  CBG MONITORING, ED - Abnormal; Notable for the following:    Glucose-Capillary 269 (*)    All other components within normal limits  CBG MONITORING, ED - Abnormal; Notable for the  following:    Glucose-Capillary 203 (*)    All other components within normal limits  POC URINE PREG, ED    Imaging Review Dg Chest 2 View  05/14/2015  CLINICAL DATA:  N/V today; no chest complaints today, pleural effusion seen on previous Abdomen CT that was done this morning per Pa's note; hx asthma; non smoker; HTn; diabetic EXAM: CHEST  2 VIEW COMPARISON:  05/08/2015 FINDINGS: The heart size and mediastinal contours are within normal limits. Both lungs are clear. No pleural effusion or pneumothorax. The visualized skeletal structures are unremarkable. IMPRESSION: No active cardiopulmonary disease. Electronically Signed   By: Lajean Manes M.D.   On: 05/14/2015 09:49   Ct Abdomen Pelvis W Contrast  05/14/2015  CLINICAL DATA:  Abdominal pain and nausea and vomiting. Elevated lipase. EXAM: CT ABDOMEN AND PELVIS WITH CONTRAST TECHNIQUE: Multidetector CT imaging of the abdomen and pelvis was performed using the standard protocol following bolus administration of intravenous contrast. CONTRAST:  93mL OMNIPAQUE IOHEXOL 300 MG/ML SOLN, 143mL OMNIPAQUE IOHEXOL 300 MG/ML SOLN COMPARISON:  Radiograph dated 05/08/2015 and CT scan dated 03/10/2015 FINDINGS: Lower chest: There is a new small right pleural effusion with slight atelectasis at the right lung base laterally and posteriorly. Small chronic hiatal hernia. Hepatobiliary: Cholecystectomy. Focal fatty infiltration in the left lobe adjacent to the falciform ligament. No biliary ductal dilatation. Pancreas: Normal. Spleen: Normal. Adrenals/Urinary Tract: Normal adrenal glands and kidneys. Slight prominence of the ureters. Chronic distention of the bladder to the level of the umbilicus. The bladder has been abnormally distended dating back to 03/18/2010. Stomach/Bowel: Small hiatal hernia. Extensive stool in the nondistended colon. The sigmoid portion of the colon is compressed by the distended urinary bladder. There is stool in the rectum.  Vascular/Lymphatic: Minimal calcification in the abdominal aorta. No adenopathy. Reproductive: 12 mm cyst on the left ovary. Small calcified fibroid in the left side of the uterine fundus. Other: No free air or free fluid. Musculoskeletal: Normal. IMPRESSION: 1. New small right pleural effusion with slight atelectasis at the right lung base. 2. Marked chronic distention of the urinary bladder with secondary slight dilatation of the ureters. The sigmoid portion of the colon is also compressed by the distended urinary bladder. 3. Normal appearing pancreas. Electronically Signed   By: Lorriane Shire M.D.   On:  05/14/2015 09:12     I have personally reviewed and evaluated these images and lab results as part of my medical decision-making.   EKG Interpretation None      MDM   Final diagnoses:  Elevated lipase  Abdominal pain, unspecified abdominal location  Elevated glucose    49 year old female presents with abdominal pain, N/V, and anxiety. States her abdominal pain and N/V are characteristic of her typical gastroparesis. Reports subjective fever and chills. Denies chest pain, shortness of breath, diarrhea, constipation.  Patient is afebrile. Mildly tachycardic. Heart regular rhythm. Lungs clear to auscultation bilaterally. Abdomen soft, with mild TTP in RUQ. No rebound, guarding, or masses. Patient appears anxious.  Will give phenergan, ativan, IV fluids. Labs as above pending.  CBC negative for leukocytosis, hemoglobin 11, which appears stable. CMP with elevated glucose of 256, anion gap within normal limits. Lipase 61. Urine pregnancy negative. UA pending.  Patient given fluids, antiemetics, ativan with symptom improvement. Given elevated lipase, will obtain CT abdomen and pelvis. CT remarkable for new small right pleural effusion, chronic distention of urinary bladder and ureters, normal appearing pancreas. CXR negative for acute cardiopulmonary disease.  Patient able to tolerate PO  intake, and reports symptom improvement. Glucose 203 s/p fluids. HR improved to mid 90s. Patient is non-toxic, feel she is stable for discharge at this time. Spoke with patient regarding glucose control and PCP follow-up. Return precautions discussed. Patient verbalizes her understanding and is in agreement with plan.   BP 177/87 mmHg  Pulse 97  Temp(Src) 98.1 F (36.7 C) (Oral)  Resp 16  Ht 5\' 2"  (1.575 m)  Wt 145 lb (65.772 kg)  BMI 26.51 kg/m2  SpO2 100%  LMP 04/11/2015     Marella Chimes, PA-C 05/14/15 [place], DO 05/15/15 [id number]

## 2015-05-14 NOTE — ED Notes (Signed)
Pt is aware that a urine specimen is needed.

## 2015-05-17 ENCOUNTER — Emergency Department (HOSPITAL_COMMUNITY): Payer: 59

## 2015-05-17 ENCOUNTER — Inpatient Hospital Stay (HOSPITAL_COMMUNITY)
Admission: EM | Admit: 2015-05-17 | Discharge: 2015-05-20 | DRG: 638 | Disposition: A | Discharge status: Home Health | Payer: 59 | Attending: Internal Medicine | Admitting: Internal Medicine

## 2015-05-17 ENCOUNTER — Inpatient Hospital Stay (HOSPITAL_COMMUNITY): Payer: 59

## 2015-05-17 ENCOUNTER — Encounter (HOSPITAL_COMMUNITY): Payer: Self-pay | Admitting: Emergency Medicine

## 2015-05-17 DIAGNOSIS — K5900 Constipation, unspecified: Secondary | ICD-10-CM

## 2015-05-17 DIAGNOSIS — K219 Gastro-esophageal reflux disease without esophagitis: Secondary | ICD-10-CM | POA: Diagnosis present

## 2015-05-17 DIAGNOSIS — D631 Anemia in chronic kidney disease: Secondary | ICD-10-CM | POA: Diagnosis present

## 2015-05-17 DIAGNOSIS — E1043 Type 1 diabetes mellitus with diabetic autonomic (poly)neuropathy: Secondary | ICD-10-CM | POA: Diagnosis present

## 2015-05-17 DIAGNOSIS — Z794 Long term (current) use of insulin: Secondary | ICD-10-CM

## 2015-05-17 DIAGNOSIS — E1010 Type 1 diabetes mellitus with ketoacidosis without coma: Secondary | ICD-10-CM | POA: Diagnosis present

## 2015-05-17 DIAGNOSIS — R079 Chest pain, unspecified: Secondary | ICD-10-CM | POA: Diagnosis not present

## 2015-05-17 DIAGNOSIS — N179 Acute kidney failure, unspecified: Secondary | ICD-10-CM | POA: Diagnosis present

## 2015-05-17 DIAGNOSIS — D509 Iron deficiency anemia, unspecified: Secondary | ICD-10-CM | POA: Diagnosis present

## 2015-05-17 DIAGNOSIS — K3184 Gastroparesis: Secondary | ICD-10-CM

## 2015-05-17 DIAGNOSIS — D649 Anemia, unspecified: Secondary | ICD-10-CM | POA: Diagnosis not present

## 2015-05-17 DIAGNOSIS — E0810 Diabetes mellitus due to underlying condition with ketoacidosis without coma: Secondary | ICD-10-CM | POA: Diagnosis not present

## 2015-05-17 DIAGNOSIS — R7989 Other specified abnormal findings of blood chemistry: Secondary | ICD-10-CM | POA: Diagnosis not present

## 2015-05-17 DIAGNOSIS — I2510 Atherosclerotic heart disease of native coronary artery without angina pectoris: Secondary | ICD-10-CM | POA: Diagnosis present

## 2015-05-17 DIAGNOSIS — R579 Shock, unspecified: Secondary | ICD-10-CM | POA: Diagnosis not present

## 2015-05-17 DIAGNOSIS — E876 Hypokalemia: Secondary | ICD-10-CM | POA: Diagnosis not present

## 2015-05-17 DIAGNOSIS — H269 Unspecified cataract: Secondary | ICD-10-CM | POA: Diagnosis present

## 2015-05-17 DIAGNOSIS — E8729 Other acidosis: Secondary | ICD-10-CM | POA: Diagnosis present

## 2015-05-17 DIAGNOSIS — F329 Major depressive disorder, single episode, unspecified: Secondary | ICD-10-CM | POA: Diagnosis present

## 2015-05-17 DIAGNOSIS — E872 Acidosis: Secondary | ICD-10-CM | POA: Diagnosis present

## 2015-05-17 DIAGNOSIS — N183 Chronic kidney disease, stage 3 (moderate): Secondary | ICD-10-CM | POA: Diagnosis present

## 2015-05-17 DIAGNOSIS — Z880 Allergy status to penicillin: Secondary | ICD-10-CM

## 2015-05-17 DIAGNOSIS — F32A Depression, unspecified: Secondary | ICD-10-CM | POA: Diagnosis present

## 2015-05-17 DIAGNOSIS — E1040 Type 1 diabetes mellitus with diabetic neuropathy, unspecified: Secondary | ICD-10-CM | POA: Diagnosis present

## 2015-05-17 DIAGNOSIS — E1022 Type 1 diabetes mellitus with diabetic chronic kidney disease: Secondary | ICD-10-CM | POA: Diagnosis present

## 2015-05-17 DIAGNOSIS — Z885 Allergy status to narcotic agent status: Secondary | ICD-10-CM

## 2015-05-17 DIAGNOSIS — I959 Hypotension, unspecified: Secondary | ICD-10-CM | POA: Diagnosis present

## 2015-05-17 DIAGNOSIS — I129 Hypertensive chronic kidney disease with stage 1 through stage 4 chronic kidney disease, or unspecified chronic kidney disease: Secondary | ICD-10-CM | POA: Diagnosis present

## 2015-05-17 DIAGNOSIS — Z884 Allergy status to anesthetic agent status: Secondary | ICD-10-CM

## 2015-05-17 DIAGNOSIS — E860 Dehydration: Secondary | ICD-10-CM | POA: Diagnosis present

## 2015-05-17 DIAGNOSIS — Z452 Encounter for adjustment and management of vascular access device: Secondary | ICD-10-CM

## 2015-05-17 DIAGNOSIS — E861 Hypovolemia: Secondary | ICD-10-CM | POA: Diagnosis present

## 2015-05-17 DIAGNOSIS — Z888 Allergy status to other drugs, medicaments and biological substances status: Secondary | ICD-10-CM

## 2015-05-17 DIAGNOSIS — F418 Other specified anxiety disorders: Secondary | ICD-10-CM | POA: Diagnosis not present

## 2015-05-17 DIAGNOSIS — F419 Anxiety disorder, unspecified: Secondary | ICD-10-CM | POA: Diagnosis present

## 2015-05-17 DIAGNOSIS — Z79899 Other long term (current) drug therapy: Secondary | ICD-10-CM

## 2015-05-17 DIAGNOSIS — R112 Nausea with vomiting, unspecified: Secondary | ICD-10-CM

## 2015-05-17 LAB — COMPREHENSIVE METABOLIC PANEL
ALK PHOS: 105 U/L (ref 38–126)
ALT: 31 U/L (ref 14–54)
ANION GAP: 24 — AB (ref 5–15)
AST: 22 U/L (ref 15–41)
Albumin: 4.1 g/dL (ref 3.5–5.0)
BUN: 15 mg/dL (ref 6–20)
CO2: 13 mmol/L — AB (ref 22–32)
Calcium: 9.7 mg/dL (ref 8.9–10.3)
Chloride: 98 mmol/L — ABNORMAL LOW (ref 101–111)
Creatinine, Ser: 1.12 mg/dL — ABNORMAL HIGH (ref 0.44–1.00)
GFR calc Af Amer: >60 mL/min (ref >60)
GFR calc non Af Amer: 58 mL/min — ABNORMAL LOW (ref >60)
GLUCOSE: 469 mg/dL — AB (ref 65–99)
POTASSIUM: 4 mmol/L (ref 3.5–5.1)
SODIUM: 135 mmol/L (ref 135–145)
Total Bilirubin: 2 mg/dL — ABNORMAL HIGH (ref 0.3–1.2)
Total Protein: 7.7 g/dL (ref 6.5–8.1)

## 2015-05-17 LAB — CBG MONITORING, ED
GLUCOSE-CAPILLARY: 435 mg/dL — AB (ref 65–99)
Glucose-Capillary: 440 mg/dL — ABNORMAL HIGH (ref 65–99)

## 2015-05-17 LAB — URINE MICROSCOPIC-ADD ON

## 2015-05-17 LAB — URINALYSIS, ROUTINE W REFLEX MICROSCOPIC
BILIRUBIN URINE: NEGATIVE
Ketones, ur: >80 mg/dL — AB
Leukocytes, UA: NEGATIVE
Nitrite: NEGATIVE
PH: 6 (ref 5.0–8.0)
Protein, ur: 30 mg/dL — AB
SPECIFIC GRAVITY, URINE: 1.02 (ref 1.005–1.030)

## 2015-05-17 LAB — CBC
HEMATOCRIT: 31.6 % — AB (ref 36.0–46.0)
HEMOGLOBIN: 10.1 g/dL — AB (ref 12.0–15.0)
MCH: 27.5 pg (ref 26.0–34.0)
MCHC: 32 g/dL (ref 30.0–36.0)
MCV: 86.1 fL (ref 78.0–100.0)
Platelets: 432 10*3/uL — ABNORMAL HIGH (ref 150–400)
RBC: 3.67 MIL/uL — ABNORMAL LOW (ref 3.87–5.11)
RDW: 14.9 % (ref 11.5–15.5)
WBC: 10.5 10*3/uL (ref 4.0–10.5)

## 2015-05-17 LAB — BASIC METABOLIC PANEL
ANION GAP: 19 — AB (ref 5–15)
Anion gap: 8 (ref 5–15)
BUN: 16 mg/dL (ref 6–20)
BUN: 13 mg/dL (ref 6–20)
CALCIUM: 8.5 mg/dL — AB (ref 8.9–10.3)
CALCIUM: 8.4 mg/dL — AB (ref 8.9–10.3)
CO2: 12 mmol/L — ABNORMAL LOW (ref 22–32)
CO2: 17 mmol/L — ABNORMAL LOW (ref 22–32)
CREATININE: 1.24 mg/dL — AB (ref 0.44–1.00)
CREATININE: 0.98 mg/dL (ref 0.44–1.00)
Chloride: 106 mmol/L (ref 101–111)
Chloride: 115 mmol/L — ABNORMAL HIGH (ref 101–111)
GFR calc non Af Amer: 51 mL/min — ABNORMAL LOW (ref >60)
GFR, EST AFRICAN AMERICAN: 59 mL/min — AB (ref >60)
Glucose, Bld: 469 mg/dL — ABNORMAL HIGH (ref 65–99)
Glucose, Bld: 137 mg/dL — ABNORMAL HIGH (ref 65–99)
Potassium: 4.4 mmol/L (ref 3.5–5.1)
Potassium: 3.7 mmol/L (ref 3.5–5.1)
SODIUM: 137 mmol/L (ref 135–145)
SODIUM: 140 mmol/L (ref 135–145)

## 2015-05-17 LAB — GLUCOSE, CAPILLARY
GLUCOSE-CAPILLARY: 371 mg/dL — AB (ref 65–99)
GLUCOSE-CAPILLARY: 306 mg/dL — AB (ref 65–99)
GLUCOSE-CAPILLARY: 234 mg/dL — AB (ref 65–99)
GLUCOSE-CAPILLARY: 170 mg/dL — AB (ref 65–99)
GLUCOSE-CAPILLARY: 152 mg/dL — AB (ref 65–99)
GLUCOSE-CAPILLARY: 88 mg/dL (ref 65–99)
Glucose-Capillary: 123 mg/dL — ABNORMAL HIGH (ref 65–99)
Glucose-Capillary: 102 mg/dL — ABNORMAL HIGH (ref 65–99)
Glucose-Capillary: 83 mg/dL (ref 65–99)

## 2015-05-17 LAB — RAPID URINE DRUG SCREEN, HOSP PERFORMED
Amphetamines: NOT DETECTED
BARBITURATES: NOT DETECTED
Benzodiazepines: NOT DETECTED
COCAINE: NOT DETECTED
Opiates: NOT DETECTED
TETRAHYDROCANNABINOL: POSITIVE — AB

## 2015-05-17 LAB — LIPASE, BLOOD: Lipase: 28 U/L (ref 11–51)

## 2015-05-17 LAB — PREGNANCY, URINE: Preg Test, Ur: NEGATIVE

## 2015-05-17 LAB — TROPONIN I

## 2015-05-17 LAB — MAGNESIUM: Magnesium: 1.6 mg/dL — ABNORMAL LOW (ref 1.7–2.4)

## 2015-05-17 LAB — PHOSPHORUS: PHOSPHORUS: 4.1 mg/dL (ref 2.5–4.6)

## 2015-05-17 LAB — MRSA PCR SCREENING: MRSA by PCR: NEGATIVE

## 2015-05-17 MED ORDER — DEXTROSE-NACL 5-0.45 % IV SOLN: INTRAVENOUS | Status: DC

## 2015-05-17 MED ORDER — PREGABALIN 75 MG PO CAPS
150.0000 mg | ORAL_CAPSULE | Freq: Two times a day (BID) | ORAL | Status: DC
  Administered 2015-05-17 – 2015-05-20 (×6): 150 mg via ORAL
  Filled 2015-05-17 (×7): qty 2

## 2015-05-17 MED ORDER — PANTOPRAZOLE SODIUM 40 MG IV SOLR
40.0000 mg | INTRAVENOUS | Status: DC
  Administered 2015-05-17 – 2015-05-19 (×3): 40 mg via INTRAVENOUS
  Filled 2015-05-17 (×4): qty 40

## 2015-05-17 MED ORDER — SODIUM CHLORIDE 0.9 % IV BOLUS (SEPSIS)
1000.0000 mL | Freq: Once | INTRAVENOUS | Status: AC
Start: 2015-05-17 — End: 2015-05-17
  Administered 2015-05-17: 1000 mL via INTRAVENOUS

## 2015-05-17 MED ORDER — DOCUSATE SODIUM 100 MG PO CAPS
100.0000 mg | ORAL_CAPSULE | Freq: Two times a day (BID) | ORAL | Status: DC
  Administered 2015-05-17 – 2015-05-20 (×6): 100 mg via ORAL
  Filled 2015-05-17 (×7): qty 1

## 2015-05-17 MED ORDER — ACETAMINOPHEN 650 MG RE SUPP: 650.0000 mg | Freq: Four times a day (QID) | RECTAL | Status: DC | PRN

## 2015-05-17 MED ORDER — ONDANSETRON HCL 4 MG PO TABS: 4.0000 mg | ORAL_TABLET | Freq: Four times a day (QID) | ORAL | Status: DC | PRN

## 2015-05-17 MED ORDER — FLUOXETINE HCL 20 MG PO CAPS
40.0000 mg | ORAL_CAPSULE | Freq: Every day | ORAL | Status: DC
  Administered 2015-05-17 – 2015-05-20 (×4): 40 mg via ORAL
  Filled 2015-05-17 (×4): qty 2

## 2015-05-17 MED ORDER — LORAZEPAM 1 MG PO TABS
1.0000 mg | ORAL_TABLET | Freq: Three times a day (TID) | ORAL | Status: DC
  Administered 2015-05-17 – 2015-05-20 (×8): 1 mg via ORAL
  Filled 2015-05-17 (×8): qty 1

## 2015-05-17 MED ORDER — LORAZEPAM 2 MG/ML IJ SOLN
0.5000 mg | Freq: Two times a day (BID) | INTRAMUSCULAR | Status: DC | PRN
  Administered 2015-05-19: 0.5 mg via INTRAVENOUS
  Filled 2015-05-17: qty 1

## 2015-05-17 MED ORDER — INSULIN ASPART 100 UNIT/ML ~~LOC~~ SOLN
0.0000 [IU] | SUBCUTANEOUS | Status: DC
Start: 2015-05-17 — End: 2015-05-18

## 2015-05-17 MED ORDER — DEXTROSE-NACL 5-0.45 % IV SOLN
INTRAVENOUS | Status: DC
  Administered 2015-05-17: 14:00:00 via INTRAVENOUS

## 2015-05-17 MED ORDER — SODIUM CHLORIDE 0.9 % IV SOLN
1000.0000 mL | INTRAVENOUS | Status: DC
  Administered 2015-05-17: 1000 mL via INTRAVENOUS

## 2015-05-17 MED ORDER — ALBUTEROL SULFATE (2.5 MG/3ML) 0.083% IN NEBU: 2.5000 mg | INHALATION_SOLUTION | RESPIRATORY_TRACT | Status: DC | PRN

## 2015-05-17 MED ORDER — MAGNESIUM CITRATE PO SOLN
1.0000 | Freq: Once | ORAL | Status: AC | PRN
  Administered 2015-05-17: 1 via ORAL
  Filled 2015-05-17: qty 296

## 2015-05-17 MED ORDER — SORBITOL 70 % SOLN
30.0000 mL | Freq: Every day | Status: DC | PRN
Start: 2015-05-17 — End: 2015-05-20
  Filled 2015-05-17: qty 30

## 2015-05-17 MED ORDER — SODIUM CHLORIDE 0.9 % IV BOLUS (SEPSIS)
1000.0000 mL | Freq: Once | INTRAVENOUS | Status: AC
  Administered 2015-05-17: 1000 mL via INTRAVENOUS

## 2015-05-17 MED ORDER — SODIUM CHLORIDE 0.9 % IV SOLN
INTRAVENOUS | Status: AC
  Administered 2015-05-17: 3.8 [IU]/h via INTRAVENOUS
  Administered 2015-05-17: 0.3 [IU]/h via INTRAVENOUS
  Filled 2015-05-17: qty 2.5

## 2015-05-17 MED ORDER — HYDROXYZINE HCL 25 MG PO TABS: 50.0000 mg | ORAL_TABLET | ORAL | Status: DC | PRN

## 2015-05-17 MED ORDER — SODIUM CHLORIDE 0.9 % IJ SOLN
3.0000 mL | Freq: Two times a day (BID) | INTRAMUSCULAR | Status: DC
  Administered 2015-05-18 – 2015-05-19 (×2): 3 mL via INTRAVENOUS

## 2015-05-17 MED ORDER — ONDANSETRON HCL 4 MG/2ML IJ SOLN
4.0000 mg | Freq: Four times a day (QID) | INTRAMUSCULAR | Status: DC | PRN
  Administered 2015-05-17 – 2015-05-19 (×3): 4 mg via INTRAVENOUS
  Filled 2015-05-17 (×3): qty 2

## 2015-05-17 MED ORDER — SODIUM CHLORIDE 0.9 % IV SOLN
INTRAVENOUS | Status: DC
  Administered 2015-05-17 – 2015-05-18 (×2): via INTRAVENOUS

## 2015-05-17 MED ORDER — INSULIN REGULAR HUMAN 100 UNIT/ML IJ SOLN
INTRAMUSCULAR | Status: DC
  Administered 2015-05-17: 3.8 [IU]/h via INTRAVENOUS
  Filled 2015-05-17: qty 2.5

## 2015-05-17 MED ORDER — MORPHINE SULFATE (PF) 2 MG/ML IV SOLN
2.0000 mg | INTRAVENOUS | Status: DC | PRN
Start: 2015-05-17 — End: 2015-05-20
  Administered 2015-05-17 – 2015-05-19 (×2): 2 mg via INTRAVENOUS
  Filled 2015-05-17 (×2): qty 1

## 2015-05-17 MED ORDER — INSULIN DETEMIR 100 UNIT/ML ~~LOC~~ SOLN: 15.0000 [IU] | SUBCUTANEOUS | Status: DC

## 2015-05-17 MED ORDER — POLYETHYLENE GLYCOL 3350 17 G PO PACK
17.0000 g | PACK | Freq: Two times a day (BID) | ORAL | Status: DC
  Administered 2015-05-20: 17 g via ORAL
  Filled 2015-05-17 (×5): qty 1

## 2015-05-17 MED ORDER — SODIUM CHLORIDE 0.9 % IV SOLN: INTRAVENOUS | Status: AC

## 2015-05-17 MED ORDER — FERROUS SULFATE 325 (65 FE) MG PO TABS
325.0000 mg | ORAL_TABLET | Freq: Every day | ORAL | Status: DC
  Administered 2015-05-17 – 2015-05-20 (×4): 325 mg via ORAL
  Filled 2015-05-17 (×4): qty 1

## 2015-05-17 MED ORDER — MORPHINE SULFATE (PF) 4 MG/ML IV SOLN
4.0000 mg | Freq: Once | INTRAVENOUS | Status: AC
Start: 2015-05-17 — End: 2015-05-17
  Administered 2015-05-17: 4 mg via INTRAVENOUS
  Filled 2015-05-17: qty 1

## 2015-05-17 MED ORDER — DEXTROSE 5 % IV SOLN
0.0000 ug/min | INTRAVENOUS | Status: DC
  Administered 2015-05-17: 4 ug/min via INTRAVENOUS
  Filled 2015-05-17: qty 4

## 2015-05-17 MED ORDER — SENNOSIDES-DOCUSATE SODIUM 8.6-50 MG PO TABS: 1.0000 | ORAL_TABLET | Freq: Every evening | ORAL | Status: DC | PRN

## 2015-05-17 MED ORDER — LORAZEPAM 2 MG/ML IJ SOLN
1.0000 mg | Freq: Once | INTRAMUSCULAR | Status: AC
  Administered 2015-05-17: 1 mg via INTRAVENOUS
  Filled 2015-05-17: qty 1

## 2015-05-17 MED ORDER — ENOXAPARIN SODIUM 40 MG/0.4ML ~~LOC~~ SOLN
40.0000 mg | SUBCUTANEOUS | Status: DC
  Administered 2015-05-17 – 2015-05-19 (×3): 40 mg via SUBCUTANEOUS
  Filled 2015-05-17 (×5): qty 0.4

## 2015-05-17 MED ORDER — PROCHLORPERAZINE EDISYLATE 5 MG/ML IJ SOLN
10.0000 mg | Freq: Four times a day (QID) | INTRAMUSCULAR | Status: DC | PRN
  Administered 2015-05-17: 10 mg via INTRAVENOUS
  Filled 2015-05-17: qty 2

## 2015-05-17 MED ORDER — DRONABINOL 5 MG PO CAPS
10.0000 mg | ORAL_CAPSULE | Freq: Two times a day (BID) | ORAL | Status: DC
  Administered 2015-05-17 – 2015-05-20 (×6): 10 mg via ORAL
  Filled 2015-05-17 (×6): qty 2

## 2015-05-17 MED ORDER — ACETAMINOPHEN 325 MG PO TABS: 650.0000 mg | ORAL_TABLET | Freq: Four times a day (QID) | ORAL | Status: DC | PRN

## 2015-05-17 MED ORDER — PROCHLORPERAZINE EDISYLATE 5 MG/ML IJ SOLN: 10.0000 mg | Freq: Four times a day (QID) | INTRAMUSCULAR | Status: DC | PRN

## 2015-05-17 MED ORDER — SODIUM CHLORIDE 0.9 % IV SOLN
1000.0000 mL | Freq: Once | INTRAVENOUS | Status: AC
Start: 2015-05-17 — End: 2015-05-17
  Administered 2015-05-17: 1000 mL via INTRAVENOUS

## 2015-05-17 MED ORDER — INSULIN ASPART 100 UNIT/ML ~~LOC~~ SOLN: 0.0000 [IU] | Freq: Three times a day (TID) | SUBCUTANEOUS | Status: DC

## 2015-05-17 MED ORDER — MAGNESIUM SULFATE 4 GM/100ML IV SOLN
4.0000 g | Freq: Once | INTRAVENOUS | Status: AC
  Administered 2015-05-17: 4 g via INTRAVENOUS
  Filled 2015-05-17: qty 100

## 2015-05-17 NOTE — Progress Notes (Signed)
Patient blood pressure in 0000000 systolic. Patient is symptomatic. Minimally responsive. Due to patient having limited IV access Mag and Insulin stopped so 1 liter bolus can be given per ACLS protocol. NP paged for update about patient situation. IV team paged.

## 2015-05-17 NOTE — ED Notes (Signed)
Per EMS: Pt from home.  Has been having NV since last night with low abd pain.  Pt has diabetes and has a CBG of 522.  Pt was given 4 mg zofran IM en route.  Vomiting clear fluid.  Denies drugs/alcohol.  Pt's son mentions something about getting a "pacemaker in her abdomen".

## 2015-05-17 NOTE — ED Notes (Signed)
Upon entrance into the room, pt was sticking her finger down her throat in an attempt to make herself vomit.

## 2015-05-17 NOTE — Progress Notes (Signed)
Inpatient Diabetes Program Recommendations  AACE/ADA: New Consensus Statement on Inpatient Glycemic Control (2015)  Target Ranges:  Prepandial:   less than 140 mg/dL      Peak postprandial:   less than 180 mg/dL (1-2 hours)      Critically ill patients:  140 - 180 mg/dL   Admit with: Gastroparesis/ DKA (glucose 469 mg/dl, CO2 13, Anion Gap 24 on initial BMET)  History: Type 1 DM, Gastropaersis  Home DM Meds: Levemir 15 units QHS       Novolog 6-15 units tid per SSI  Current Insulin Orders: IV Insulin drip per DKA orders (started at 11am)    -Patient well known to the Inpatient Glycemic Control Team.  This is her 7th admission since April of 2016.  Also of note, patient has had 12 ED visits since April as well  -I spoke with this patient back on 02/10/15 when she was admitted to [facility] hospital for DKA. Patient's son told me she was planning to seek care under a GI Specialist and an Endocrinologist with [place], it looks as if patient has sought care under a GI Specialist with [facility].  I do not see where patient has established care with an Endocrinologist (which she needs to do given her DM complications and labile CBGs).  -Note patient started on IV insulin drip at 11am per DKA orders.  Once patient's BMET has improved (Anion Gap <12, CO2 20 or higher), patient can transition back to SQ insulin regimen.  Make sure patient receives Levemir 1-2 hours before IV insulin drip stopped.    --Will follow patient during hospitalization--  Wyn Quaker RN, MSN, CDE Diabetes Coordinator Inpatient Glycemic Control Team Team Pager: [phone] (8a-5p)

## 2015-05-17 NOTE — Progress Notes (Signed)
Patient remains hypotensive. Patient is symptomatic. NP Schoor notified. Additional liter bolus of NS ordered. CCM notified for line placement.

## 2015-05-17 NOTE — ED Notes (Signed)
Bed: WA09 Expected date:  Expected time:  Means of arrival:  Comments: Ems- n/ v

## 2015-05-17 NOTE — Progress Notes (Signed)
Soap suds enema refused.

## 2015-05-17 NOTE — Procedures (Signed)
Central Venous Catheter Insertion Procedure Note [name] [id number] 09/05/1965  Procedure: Insertion of Central Venous Catheter Indications: Assessment of intravascular volume, Drug and/or fluid administration and Frequent blood sampling  Procedure Details Consent: Risks of procedure as well as the alternatives and risks of each were explained to the (patient/caregiver).  Consent for procedure obtained. Time Out: Verified patient identification, verified procedure, site/side was marked, verified correct patient position, special equipment/implants available, medications/allergies/relevent history reviewed, required imaging and test results available.  Performed  Maximum sterile technique was used including antiseptics, cap, gloves, gown, hand hygiene, mask and sheet. Skin prep: Chlorhexidine; local anesthetic administered A antimicrobial bonded/coated triple lumen catheter was placed in the right internal jugular vein using the Seldinger technique.  Evaluation Blood flow good Complications: No apparent complications Patient did tolerate procedure well. Chest X-ray ordered to verify placement.  CXR: pending.  Procedure performed under direct ultrasound guidance for real time vessel cannulation.      Montey Hora, [place] [facility] Pager: [phone]  or [phone] 05/17/2015, 9:34 PM  Rush Farmer, M.D. [facility] Pulmonary/Critical Care Medicine. Pager: [phone]. After hours pager: [phone].

## 2015-05-17 NOTE — H&P (Signed)
[facility] History and Physical  Imogean Pichette [id number] DOB: Sep 13, 1965 DOA: 05/17/2015  Referring physician: Dr Venora Maples PCP: Barbette Merino, MD   Chief Complaint: nausea/emesis/abdominal pain  HPI: Beckie Candito is a 49 y.o. female  With history of type 1 diabetes, hypertension, anxiety, gastroparesis and diabetic neuropathy presented to the ED with several hour history of nausea, nonbloody emesis, diffuse abdominal pain. Patient does endorse chronic constipation, generalized weakness, chills. Patient denies any fevers, no chest pain, no shortness of breath, no diarrhea, no dysuria, no melena, no hematemesis, no hematochezia. Patient states she's been compliant with her medications. Patient was seen in the ED chest x-ray was not done. Comprehensive metabolic profile had a sodium of 135 bicarbonate of 13 anion gap of 24 glucose of 469 otherwise was within normal limits. CBC with a hemoglobin of 10.1 otherwise was within normal limits. Urine pregnancy was negative. EKG done showed slight ST depression in leads 2,3 and aVF with a rate of 103. [facility] were called to admit for further evaluation and management.   Review of Systems: Per history of present illness otherwise negative. Constitutional:  No weight loss, night sweats, Fevers, chills, fatigue.  HEENT:  No headaches, Difficulty swallowing,Tooth/dental problems,Sore throat,  No sneezing, itching, ear ache, nasal congestion, post nasal drip,  Cardio-vascular:  No chest pain, Orthopnea, PND, swelling in lower extremities, anasarca, dizziness, palpitations  GI:  No heartburn, indigestion, abdominal pain, nausea, vomiting, diarrhea, change in bowel habits, loss of appetite  Resp:  No shortness of breath with exertion or at rest. No excess mucus, no productive cough, No non-productive cough, No coughing up of blood.No change in color of mucus.No wheezing.No chest wall deformity  Skin:  no rash or lesions.  GU:  no  dysuria, change in color of urine, no urgency or frequency. No flank pain.  Musculoskeletal:  No joint pain or swelling. No decreased range of motion. No back pain.  Psych:  No change in mood or affect. No depression or anxiety. No memory loss.   Past Medical History  Diagnosis Date  . Hypertension   . Cataract   . Anxiety   . Anemia     required blood transfusion  09/2014 at [facility]  . GERD (gastroesophageal reflux disease)   . Constipation 04/03/2012  . Complication of anesthesia   . PONV (postoperative nausea and vomiting)   . Asthma     IN CHILDHOOD  . Neuromuscular disorder ([place])     Diabetic neuropathy  . Gastroparesis   . Diabetic neuropathy ([place])   . History of blood product transfusion   . Diabetes mellitus     diagnosed age 45  . Panic attack    Past Surgical History  Procedure Laterality Date  . Retinal detachment surgery    . Posterior vitrectomy and membrane peel-left eye  09/28/2002  . Posterior vitrectomy and membrane peel-right eye  03/16/2002  . Eye surgery    . Gailstones    . Esophagogastroduodenoscopy  09/27/2014    at [facility], Dr Rolan Lipa. biospy neg for celiac, neg for H pylori.   . Colonoscopy  09/27/2014    at [facility]   Social History:  reports that she has never smoked. She has never used smokeless tobacco. She reports that she does not drink alcohol or use illicit drugs.  Allergies  Allergen Reactions  . Anesthetics, Amide Nausea And Vomiting  . Penicillins Diarrhea and Nausea And Vomiting    Has patient had a PCN reaction causing immediate rash, facial/tongue/throat swelling, SOB  or lightheadedness with hypotension: No Has patient had a PCN reaction causing severe rash involving mucus membranes or skin necrosis: No Has patient had a PCN reaction that required hospitalization Yes  Has patient had a PCN reaction occurring within the last 10 years: Yes  If all of the above answers are "NO", then may proceed with Cephalosporin use.   . Buprenorphine  Hcl Rash  . Encainide Nausea And Vomiting  . Metoclopramide Rash  . Morphine And Related Rash    Family History  Problem Relation Age of Onset  . Cystic fibrosis Mother   . Hypertension Father   . Diabetes Brother   . Hypertension Maternal Grandmother     Prior to Admission medications   Medication Sig Start Date End Date Taking? Authorizing Provider  dronabinol (MARINOL) 10 MG capsule Take 10 mg by mouth 2 (two) times daily. 02/04/15  Yes Historical Provider, MD  ferrous sulfate 325 (65 FE) MG tablet Take 325 mg by mouth daily.    Yes Historical Provider, MD  FLUoxetine (PROZAC) 40 MG capsule Take 40 mg by mouth daily.   Yes Historical Provider, MD  furosemide (LASIX) 40 MG tablet Take 40 mg by mouth daily.   Yes Historical Provider, MD  hydrOXYzine (ATARAX/VISTARIL) 50 MG tablet Take 50 mg by mouth every 4 (four) hours as needed for anxiety.  05/05/15  Yes Historical Provider, MD  insulin aspart (NOVOLOG) 100 UNIT/ML injection Inject 0-15 Units into the skin 3 (three) times daily with meals. 6-15 units three times daily with food.  Per sliding scale 03/12/15  Yes Belkys A Regalado, MD  insulin detemir (LEVEMIR) 100 UNIT/ML injection Inject 0.15 mLs (15 Units total) into the skin at bedtime. 02/17/13  Yes Robbie Lis, MD  lisinopril (PRINIVIL,ZESTRIL) 10 MG tablet Take 10 mg by mouth daily. 10/13/14  Yes Historical Provider, MD  LORazepam (ATIVAN) 1 MG tablet Take 1 mg by mouth 3 (three) times daily.   Yes Historical Provider, MD  ondansetron (ZOFRAN ODT) 4 MG disintegrating tablet Take 1 tablet (4 mg total) by mouth every 8 (eight) hours as needed for nausea. 05/14/15  Yes Marella Chimes, PA-C  pantoprazole (PROTONIX) 40 MG tablet Take 40 mg by mouth 2 (two) times daily. 01/04/15  Yes Historical Provider, MD  potassium chloride (K-DUR) 10 MEQ tablet Take 1 tablet (10 mEq total) by mouth daily. 04/27/15  Yes Samantha Tripp Dowless, PA-C  potassium chloride SA (K-DUR,KLOR-CON) 20 MEQ  tablet Take 2 tablets (40 mEq total) by mouth once. 04/09/15  Yes Nita Sells, MD  pregabalin (LYRICA) 150 MG capsule Take 1 capsule (150 mg total) by mouth 2 (two) times daily. 02/17/13  Yes Robbie Lis, MD  promethazine (PHENERGAN) 25 MG suppository Place 1 suppository (25 mg total) rectally every 6 (six) hours as needed for nausea or vomiting. 04/27/15  Yes Samantha Tripp Dowless, PA-C  promethazine (PHENERGAN) 25 MG tablet Take 1 tablet (25 mg total) by mouth every 6 (six) hours as needed for nausea or vomiting. 04/27/15  Yes Samantha Tripp Dowless, PA-C  ALPRAZolam (XANAX) 0.25 MG tablet Take 0.25 mg by mouth 3 (three) times daily as needed for anxiety.     Historical Provider, MD  ciprofloxacin (CIPRO) 500 MG tablet Take 1 tablet (500 mg total) by mouth 2 (two) times daily. Patient not taking: Reported on 04/27/2015 04/20/15   Kelvin Cellar, MD  prochlorperazine (COMPAZINE) 25 MG suppository Place 25 mg rectally every 12 (twelve) hours as needed for nausea or vomiting.  04/12/15 05/14/15  Historical Provider, MD   Physical Exam: Filed Vitals:   05/17/15 0900 05/17/15 0945 05/17/15 1009 05/17/15 1046  BP: 141/115 126/58 113/71 106/54  Pulse: 113  111 106  Temp:      TempSrc:      Resp: 27 23 31 12   SpO2: 100%  100% 100%    Wt Readings from Last 3 Encounters:  05/14/15 65.772 kg (145 lb)  04/22/15 65.772 kg (145 lb)  04/19/15 60.3 kg (132 lb 15 oz)    General:  Well-developed well-nourished laying on gurney in no acute cardiopulmonary distress. Speaking in full sentences. Eyes: PERRLA, EOMI, normal lids, irises & conjunctiva ENT: grossly normal hearing, lips & tongue. Dry mucous membranes. Neck: no LAD, masses or thyromegaly Cardiovascular: Tachycardia, no m/r/g. No LE edema. Telemetry: Sinus tachycardia Respiratory: CTA bilaterally, no w/r/r. Normal respiratory effort. Abdomen: soft, ntnd, positive bowel sounds, no rebound, no guarding Skin: no rash or induration seen  on limited exam Musculoskeletal: grossly normal tone BUE/BLE Psychiatric: grossly normal mood and affect, speech fluent and appropriate Neurologic: Alert and oriented 3. Cranial nerves II through XII are grossly intact. No focal deficits.           Labs on Admission:  Basic Metabolic Panel:  Recent Labs Lab 05/14/15 0633 05/17/15 0826  NA 136 135  K 3.9 4.0  CL 98* 98*  CO2 28 13*  GLUCOSE 256* 469*  BUN 7 15  CREATININE 0.85 1.12*  CALCIUM 9.8 9.7   Liver Function Tests:  Recent Labs Lab 05/14/15 0633 05/17/15 0826  AST 25 22  ALT 43 31  ALKPHOS 103 105  BILITOT 1.0 2.0*  PROT 7.9 7.7  ALBUMIN 3.9 4.1    Recent Labs Lab 05/14/15 0633 05/17/15 0826  LIPASE 61* 28   No results for input(s): AMMONIA in the last 168 hours. CBC:  Recent Labs Lab 05/14/15 0633 05/17/15 0826  WBC 4.7 10.5  NEUTROABS 3.3  --   HGB 11.0* 10.1*  HCT 33.7* 31.6*  MCV 86.0 86.1  PLT 494* 432*   Cardiac Enzymes: No results for input(s): CKTOTAL, CKMB, CKMBINDEX, TROPONINI in the last 168 hours.  BNP (last 3 results) No results for input(s): BNP in the last 8760 hours.  ProBNP (last 3 results) No results for input(s): PROBNP in the last 8760 hours.  CBG:  Recent Labs Lab 05/14/15 0645 05/14/15 1024 05/17/15 0827 05/17/15 1046  GLUCAP 269* 203* 435* 440*    Radiological Exams on Admission: No results found.  EKG: Independently reviewed. Slight ST depression leads 2, 3, aVF. Rate of 103.  Assessment/Plan Principal Problem:   DKA, type 1 (HCC) Active Problems:   Type 1 diabetes mellitus (HCC)   Diabetic neuropathy (HCC)   Nausea and vomiting   Gastroparesis   Dehydration   Type 1 diabetes, uncontrolled, with gastroparesis (HCC)   Anemia   Constipation  #1 DKA Questionable etiology. Patient with nausea vomiting of diffuse abdominal pain that she states is consistent with a gastroparesis. Will check a chest x-ray. Check abdominal films. Urinalysis is  nitrite negative leukocytes negative. Urinalysis has ketonuria. Patient noted to have an ANION gap of 24. With a bicarbonate level of 13 and a glucose of 469. Check a magnesium level. Cycle cardiac enzymes every 6hrs 3. Check a hemoglobin A1c. Place on IV fluids, supportive care, glucose stabilizer. Once anion gap is closed and acidosis had resolved will transition to subcutaneous insulin. Follow.  #2 anemia Check an anemia panel. Follow H&H.  #  3 nausea/vomiting/abdominal pain Likely secondary to patient's gastroparesis. Will get abdominal films to rule out ileus versus obstruction. Nothing by mouth. IV fluids. Supportive care.  #4 dehydration IV fluids.  #5 constipation Will place on MiraLAX twice daily and Senokot-S.  #6 abnormal EKG EKG with some slight depression in leads 2,3, aVF. Cycle cardiac enzymes every 6 hours 3. Check a 2-D echo. Follow.  #7 prophylaxis Lovenox for DVT prophylaxis.  Code Status: Full DVT Prophylaxis: Lovenox. Family Communication: Updated patient. No family present. Disposition Plan: Admit to stepdown unit.  Time spent: 21 minutes  Jamarkus Lisbon M.D. [facility] Pager [phone]

## 2015-05-17 NOTE — Care Management Note (Signed)
Case Management Note  Patient Details  Name: [name] MRN: [id number] Date of Birth: [birth year]  Subjective/Objective:            dka        Action/Plan: Date: May 17, 2015 Chart reviewed for concurrent status and case management needs. Will continue to follow patient for changes and needs: Yvette Harman, RN, BSN, [place]   [phone]  Expected Discharge Date:   (UNKNOWN)               Expected Discharge Plan:  Home/Self Care  In-House Referral:  NA  Discharge planning Services  CM Consult  Post Acute Care Choice:  NA Choice offered to:  NA  DME Arranged:    DME Agency:     HH Arranged:    HH Agency:     Status of Service:  In process, will continue to follow  Medicare Important Message Given:    Date Medicare IM Given:    Medicare IM give by:    Date Additional Medicare IM Given:    Additional Medicare Important Message give by:     If discussed at [place] of Stay Meetings, dates discussed:    Additional Comments:  Leeroy Cha, RN 05/17/2015, 4:13 PM

## 2015-05-17 NOTE — ED Notes (Signed)
Patient transported to X-ray 

## 2015-05-17 NOTE — ED Provider Notes (Signed)
CSN: [id number]     Arrival date & time 05/17/15  [id number] History   First MD Initiated Contact with Patient 05/17/15 0747     Chief Complaint  Patient presents with  . Nausea  . Emesis     (Consider location/radiation/quality/duration/timing/severity/associated sxs/prior Treatment) HPI  Patient is a 49 year old female with history of gastroparesis, DM, anxiety, and panic attacks who presents with nausea and vomiting for the past 9 hours. Of note patient has been seen in the ED numerous times over the past 6 months for the same complaint. Patient reports that her current symptoms are consistent with her previous gastroparesis flares. Emesis is clear yellow fluid. No fevers or chills. No diarrhea. Has abdominal pain "all over." No dysuria. No chest pain or shortness of breath. Received zofran from EMS which has not significantly helped.   Past Medical History  Diagnosis Date  . Hypertension   . Cataract   . Anxiety   . Anemia     required blood transfusion  09/2014 at [facility]  . GERD (gastroesophageal reflux disease)   . Constipation 04/03/2012  . Complication of anesthesia   . PONV (postoperative nausea and vomiting)   . Asthma     IN CHILDHOOD  . Neuromuscular disorder ([place])     Diabetic neuropathy  . Gastroparesis   . Diabetic neuropathy ([place])   . History of blood product transfusion   . Diabetes mellitus     diagnosed age 20  . Panic attack    Past Surgical History  Procedure Laterality Date  . Retinal detachment surgery    . Posterior vitrectomy and membrane peel-left eye  09/28/2002  . Posterior vitrectomy and membrane peel-right eye  03/16/2002  . Eye surgery    . Gailstones    . Esophagogastroduodenoscopy  09/27/2014    at [facility], Dr Rolan Lipa. biospy neg for celiac, neg for H pylori.   . Colonoscopy  09/27/2014    at [facility]   Family History  Problem Relation Age of Onset  . Cystic fibrosis Mother   . Hypertension Father   . Diabetes Brother   . Hypertension  Maternal Grandmother    Social History  Substance Use Topics  . Smoking status: Never Smoker   . Smokeless tobacco: Never Used  . Alcohol Use: No   OB History    No data available     Review of Systems  Constitutional: Negative for fever.  HENT: Negative.   Eyes: Negative.   Respiratory: Negative for shortness of breath.   Cardiovascular: Negative for chest pain.  Gastrointestinal: Positive for nausea and vomiting. Negative for diarrhea.  Endocrine: Negative.   Genitourinary: Negative.   Skin: Negative.   Allergic/Immunologic: Negative.   Hematological: Negative.   Psychiatric/Behavioral: Negative.       Allergies  Anesthetics, amide; Penicillins; Buprenorphine hcl; Encainide; Metoclopramide; and Morphine and related  Home Medications   Prior to Admission medications   Medication Sig Start Date End Date Taking? Authorizing Provider  dronabinol (MARINOL) 10 MG capsule Take 10 mg by mouth 2 (two) times daily. 02/04/15  Yes Historical Provider, MD  ferrous sulfate 325 (65 FE) MG tablet Take 325 mg by mouth daily.    Yes Historical Provider, MD  FLUoxetine (PROZAC) 40 MG capsule Take 40 mg by mouth daily.   Yes Historical Provider, MD  furosemide (LASIX) 40 MG tablet Take 40 mg by mouth daily.   Yes Historical Provider, MD  hydrOXYzine (ATARAX/VISTARIL) 50 MG tablet Take 50 mg by mouth  every 4 (four) hours as needed for anxiety.  05/05/15  Yes Historical Provider, MD  insulin aspart (NOVOLOG) 100 UNIT/ML injection Inject 0-15 Units into the skin 3 (three) times daily with meals. 6-15 units three times daily with food.  Per sliding scale 03/12/15  Yes Belkys A Regalado, MD  insulin detemir (LEVEMIR) 100 UNIT/ML injection Inject 0.15 mLs (15 Units total) into the skin at bedtime. 02/17/13  Yes Robbie Lis, MD  lisinopril (PRINIVIL,ZESTRIL) 10 MG tablet Take 10 mg by mouth daily. 10/13/14  Yes Historical Provider, MD  LORazepam (ATIVAN) 1 MG tablet Take 1 mg by mouth 3 (three)  times daily.   Yes Historical Provider, MD  ondansetron (ZOFRAN ODT) 4 MG disintegrating tablet Take 1 tablet (4 mg total) by mouth every 8 (eight) hours as needed for nausea. 05/14/15  Yes Marella Chimes, PA-C  pantoprazole (PROTONIX) 40 MG tablet Take 40 mg by mouth 2 (two) times daily. 01/04/15  Yes Historical Provider, MD  potassium chloride (K-DUR) 10 MEQ tablet Take 1 tablet (10 mEq total) by mouth daily. 04/27/15  Yes Samantha Tripp Dowless, PA-C  potassium chloride SA (K-DUR,KLOR-CON) 20 MEQ tablet Take 2 tablets (40 mEq total) by mouth once. 04/09/15  Yes Nita Sells, MD  pregabalin (LYRICA) 150 MG capsule Take 1 capsule (150 mg total) by mouth 2 (two) times daily. 02/17/13  Yes Robbie Lis, MD  promethazine (PHENERGAN) 25 MG suppository Place 1 suppository (25 mg total) rectally every 6 (six) hours as needed for nausea or vomiting. 04/27/15  Yes Samantha Tripp Dowless, PA-C  promethazine (PHENERGAN) 25 MG tablet Take 1 tablet (25 mg total) by mouth every 6 (six) hours as needed for nausea or vomiting. 04/27/15  Yes Samantha Tripp Dowless, PA-C  ALPRAZolam (XANAX) 0.25 MG tablet Take 0.25 mg by mouth 3 (three) times daily as needed for anxiety.     Historical Provider, MD  ciprofloxacin (CIPRO) 500 MG tablet Take 1 tablet (500 mg total) by mouth 2 (two) times daily. Patient not taking: Reported on 04/27/2015 04/20/15   Kelvin Cellar, MD  prochlorperazine (COMPAZINE) 25 MG suppository Place 25 mg rectally every 12 (twelve) hours as needed for nausea or vomiting.  04/12/15 05/14/15  Historical Provider, MD   BP 113/71 mmHg  Pulse 111  Temp(Src) 97.7 F (36.5 C) (Oral)  Resp 31  SpO2 100%  LMP 04/11/2015 Physical Exam  Constitutional: She is oriented to person, place, and time. She appears ill.  HENT:  Head: Normocephalic and atraumatic.  Eyes: EOM are normal. Pupils are equal, round, and reactive to light.  Neck: Normal range of motion. Neck supple.  Cardiovascular: An  irregular rhythm present.  Pulmonary/Chest: Effort normal and breath sounds normal. No respiratory distress. She has no wheezes.  Abdominal: Soft. She exhibits no distension and no mass. There is tenderness (diffuse, nonfocal). There is no rebound and no guarding.  Musculoskeletal: Normal range of motion.  Neurological: She is alert and oriented to person, place, and time. She has normal strength. No cranial nerve deficit or sensory deficit.  Skin: Skin is warm and dry.  Psychiatric: She has a normal mood and affect. Her behavior is normal.  Nursing note and vitals reviewed.   ED Course  Procedures (including critical care time) Labs Review Labs Reviewed  COMPREHENSIVE METABOLIC PANEL - Abnormal; Notable for the following:    Chloride 98 (*)    CO2 13 (*)    Glucose, Bld 469 (*)    Creatinine, Ser 1.12 (*)  Total Bilirubin 2.0 (*)    GFR calc non Af Amer 58 (*)    Anion gap 24 (*)    All other components within normal limits  CBC - Abnormal; Notable for the following:    RBC 3.67 (*)    Hemoglobin 10.1 (*)    HCT 31.6 (*)    Platelets 432 (*)    All other components within normal limits  URINALYSIS, ROUTINE W REFLEX MICROSCOPIC (NOT AT [facility]) - Abnormal; Notable for the following:    APPearance CLOUDY (*)    Glucose, UA >1000 (*)    Hgb urine dipstick TRACE (*)    Ketones, ur >80 (*)    Protein, ur 30 (*)    All other components within normal limits  URINE MICROSCOPIC-ADD ON - Abnormal; Notable for the following:    Squamous Epithelial / LPF 0-5 (*)    Bacteria, UA FEW (*)    All other components within normal limits  CBG MONITORING, ED - Abnormal; Notable for the following:    Glucose-Capillary 435 (*)    All other components within normal limits  LIPASE, BLOOD  BLOOD GAS, VENOUS  TROPONIN I    Imaging Review No results found. I have personally reviewed and evaluated these images and lab results as part of my medical decision-making.   EKG Interpretation None       MDM   Final diagnoses:  Diabetic ketoacidosis without coma associated with diabetes mellitus due to underlying condition [facility])    Patient is a 49 year old female presenting with nausea and vomiting consistent with gastroparesis flare. Mild diffuse abdominal tenderness on exam, otherwise unremarkable. Left EJ placed by Dr Venora Maples. Will give fluids, check labs. Labs consistent with DKA. No clear etiology for DKA. Patient endorses compliance to insulin regimen. No signs of infection on exam. Electrolytes within normal limits. Will start glucostabilizer. Discussed case with [facility]. Will admit patient to stepdown.   Vivi Barrack, MD 05/17/15 [place], MD 05/17/15 [place], MD 05/17/15 1101

## 2015-05-18 ENCOUNTER — Encounter (HOSPITAL_COMMUNITY): Payer: Self-pay | Admitting: Internal Medicine

## 2015-05-18 ENCOUNTER — Inpatient Hospital Stay (HOSPITAL_COMMUNITY): Payer: 59

## 2015-05-18 DIAGNOSIS — F329 Major depressive disorder, single episode, unspecified: Secondary | ICD-10-CM | POA: Diagnosis present

## 2015-05-18 DIAGNOSIS — F419 Anxiety disorder, unspecified: Secondary | ICD-10-CM

## 2015-05-18 DIAGNOSIS — F32A Depression, unspecified: Secondary | ICD-10-CM

## 2015-05-18 DIAGNOSIS — E1140 Type 2 diabetes mellitus with diabetic neuropathy, unspecified: Secondary | ICD-10-CM

## 2015-05-18 DIAGNOSIS — E0810 Diabetes mellitus due to underlying condition with ketoacidosis without coma: Secondary | ICD-10-CM

## 2015-05-18 DIAGNOSIS — I9589 Other hypotension: Secondary | ICD-10-CM

## 2015-05-18 DIAGNOSIS — R579 Shock, unspecified: Secondary | ICD-10-CM

## 2015-05-18 DIAGNOSIS — E876 Hypokalemia: Secondary | ICD-10-CM

## 2015-05-18 DIAGNOSIS — R7989 Other specified abnormal findings of blood chemistry: Secondary | ICD-10-CM

## 2015-05-18 DIAGNOSIS — D509 Iron deficiency anemia, unspecified: Secondary | ICD-10-CM | POA: Diagnosis present

## 2015-05-18 DIAGNOSIS — E1040 Type 1 diabetes mellitus with diabetic neuropathy, unspecified: Secondary | ICD-10-CM

## 2015-05-18 DIAGNOSIS — Z794 Long term (current) use of insulin: Secondary | ICD-10-CM

## 2015-05-18 DIAGNOSIS — N183 Chronic kidney disease, stage 3 (moderate): Secondary | ICD-10-CM

## 2015-05-18 DIAGNOSIS — E1065 Type 1 diabetes mellitus with hyperglycemia: Secondary | ICD-10-CM

## 2015-05-18 DIAGNOSIS — E872 Acidosis: Secondary | ICD-10-CM

## 2015-05-18 DIAGNOSIS — E1165 Type 2 diabetes mellitus with hyperglycemia: Secondary | ICD-10-CM

## 2015-05-18 DIAGNOSIS — F418 Other specified anxiety disorders: Secondary | ICD-10-CM

## 2015-05-18 DIAGNOSIS — E1043 Type 1 diabetes mellitus with diabetic autonomic (poly)neuropathy: Secondary | ICD-10-CM

## 2015-05-18 DIAGNOSIS — R079 Chest pain, unspecified: Secondary | ICD-10-CM

## 2015-05-18 DIAGNOSIS — D631 Anemia in chronic kidney disease: Secondary | ICD-10-CM

## 2015-05-18 DIAGNOSIS — E1143 Type 2 diabetes mellitus with diabetic autonomic (poly)neuropathy: Secondary | ICD-10-CM

## 2015-05-18 HISTORY — DX: Depression, unspecified: F32.A

## 2015-05-18 HISTORY — DX: Anxiety disorder, unspecified: F41.9

## 2015-05-18 HISTORY — DX: Type 1 diabetes mellitus with diabetic neuropathy, unspecified: E10.40

## 2015-05-18 LAB — CBC
HEMATOCRIT: 25.9 % — AB (ref 36.0–46.0)
HEMOGLOBIN: 8.3 g/dL — AB (ref 12.0–15.0)
MCH: 28.3 pg (ref 26.0–34.0)
MCHC: 32 g/dL (ref 30.0–36.0)
MCV: 88.4 fL (ref 78.0–100.0)
Platelets: 363 10*3/uL (ref 150–400)
RBC: 2.93 MIL/uL — AB (ref 3.87–5.11)
RDW: 15.6 % — ABNORMAL HIGH (ref 11.5–15.5)
WBC: 10.1 10*3/uL (ref 4.0–10.5)

## 2015-05-18 LAB — TROPONIN I
TROPONIN I: 0.43 ng/mL — AB (ref <0.031)
Troponin I: 0.3 ng/mL — ABNORMAL HIGH (ref <0.031)
Troponin I: 0.48 ng/mL — ABNORMAL HIGH (ref <0.031)

## 2015-05-18 LAB — GLUCOSE, CAPILLARY
GLUCOSE-CAPILLARY: 100 mg/dL — AB (ref 65–99)
GLUCOSE-CAPILLARY: 122 mg/dL — AB (ref 65–99)
GLUCOSE-CAPILLARY: 156 mg/dL — AB (ref 65–99)
GLUCOSE-CAPILLARY: 163 mg/dL — AB (ref 65–99)
GLUCOSE-CAPILLARY: 169 mg/dL — AB (ref 65–99)
GLUCOSE-CAPILLARY: 187 mg/dL — AB (ref 65–99)
GLUCOSE-CAPILLARY: 63 mg/dL — AB (ref 65–99)
GLUCOSE-CAPILLARY: 64 mg/dL — AB (ref 65–99)
GLUCOSE-CAPILLARY: 65 mg/dL (ref 65–99)
Glucose-Capillary: 208 mg/dL — ABNORMAL HIGH (ref 65–99)
Glucose-Capillary: 203 mg/dL — ABNORMAL HIGH (ref 65–99)
Glucose-Capillary: 112 mg/dL — ABNORMAL HIGH (ref 65–99)
Glucose-Capillary: 83 mg/dL (ref 65–99)
Glucose-Capillary: 98 mg/dL (ref 65–99)
Glucose-Capillary: 114 mg/dL — ABNORMAL HIGH (ref 65–99)
Glucose-Capillary: 127 mg/dL — ABNORMAL HIGH (ref 65–99)
Glucose-Capillary: 266 mg/dL — ABNORMAL HIGH (ref 65–99)
Glucose-Capillary: 91 mg/dL (ref 65–99)

## 2015-05-18 LAB — URINE CULTURE

## 2015-05-18 LAB — BLOOD GAS, VENOUS
ACID-BASE DEFICIT: 14.8 mmol/L — AB (ref 0.0–2.0)
Bicarbonate: 11.3 mEq/L — ABNORMAL LOW (ref 20.0–24.0)
FIO2: 0.21
O2 SAT: 85.8 %
PATIENT TEMPERATURE: 98.6
PCO2 VEN: 28.1 mmHg — AB (ref 45.0–50.0)
TCO2: 11.1 mmol/L (ref 0–100)
pH, Ven: 7.229 — ABNORMAL LOW (ref 7.250–7.300)
pO2, Ven: 64.4 mmHg — ABNORMAL HIGH (ref 30.0–45.0)

## 2015-05-18 LAB — BASIC METABOLIC PANEL
ANION GAP: 9 (ref 5–15)
ANION GAP: 5 (ref 5–15)
ANION GAP: 5 (ref 5–15)
Anion gap: 7 (ref 5–15)
BUN: 11 mg/dL (ref 6–20)
BUN: 13 mg/dL (ref 6–20)
BUN: 10 mg/dL (ref 6–20)
BUN: 10 mg/dL (ref 6–20)
CALCIUM: 7.8 mg/dL — AB (ref 8.9–10.3)
CHLORIDE: 118 mmol/L — AB (ref 101–111)
CHLORIDE: 116 mmol/L — AB (ref 101–111)
CHLORIDE: 115 mmol/L — AB (ref 101–111)
CO2: 19 mmol/L — ABNORMAL LOW (ref 22–32)
CO2: 16 mmol/L — AB (ref 22–32)
CO2: 20 mmol/L — ABNORMAL LOW (ref 22–32)
CO2: 16 mmol/L — AB (ref 22–32)
CREATININE: 1.18 mg/dL — AB (ref 0.44–1.00)
Calcium: 8 mg/dL — ABNORMAL LOW (ref 8.9–10.3)
Calcium: 7.8 mg/dL — ABNORMAL LOW (ref 8.9–10.3)
Calcium: 8.1 mg/dL — ABNORMAL LOW (ref 8.9–10.3)
Chloride: 115 mmol/L — ABNORMAL HIGH (ref 101–111)
Creatinine, Ser: 1.08 mg/dL — ABNORMAL HIGH (ref 0.44–1.00)
Creatinine, Ser: 0.98 mg/dL (ref 0.44–1.00)
Creatinine, Ser: 0.96 mg/dL (ref 0.44–1.00)
GFR calc Af Amer: >60 mL/min (ref >60)
GFR calc Af Amer: >60 mL/min (ref >60)
GFR calc Af Amer: >60 mL/min (ref >60)
GFR calc non Af Amer: 54 mL/min — ABNORMAL LOW (ref >60)
GFR calc non Af Amer: 60 mL/min — ABNORMAL LOW (ref >60)
GFR calc non Af Amer: >60 mL/min (ref >60)
GLUCOSE: 114 mg/dL — AB (ref 65–99)
GLUCOSE: 216 mg/dL — AB (ref 65–99)
GLUCOSE: 224 mg/dL — AB (ref 65–99)
Glucose, Bld: 93 mg/dL (ref 65–99)
POTASSIUM: 3.7 mmol/L (ref 3.5–5.1)
POTASSIUM: 3.5 mmol/L (ref 3.5–5.1)
POTASSIUM: 3.1 mmol/L — AB (ref 3.5–5.1)
Potassium: 3.6 mmol/L (ref 3.5–5.1)
SODIUM: 140 mmol/L (ref 135–145)
SODIUM: 140 mmol/L (ref 135–145)
Sodium: 140 mmol/L (ref 135–145)
Sodium: 141 mmol/L (ref 135–145)

## 2015-05-18 LAB — IRON AND TIBC
Iron: 52 ug/dL (ref 28–170)
SATURATION RATIOS: 15 % (ref 10.4–31.8)
TIBC: 340 ug/dL (ref 250–450)
UIBC: 288 ug/dL

## 2015-05-18 LAB — HEMOGLOBIN A1C
HEMOGLOBIN A1C: 7.4 % — AB (ref 4.8–5.6)
Mean Plasma Glucose: 166 mg/dL

## 2015-05-18 LAB — MAGNESIUM: Magnesium: 2.2 mg/dL (ref 1.7–2.4)

## 2015-05-18 LAB — PROCALCITONIN: PROCALCITONIN: 0.46 ng/mL

## 2015-05-18 LAB — FERRITIN: FERRITIN: 13 ng/mL (ref 11–307)

## 2015-05-18 LAB — VITAMIN B12: Vitamin B-12: 131 pg/mL — ABNORMAL LOW (ref 180–914)

## 2015-05-18 MED ORDER — INSULIN ASPART 100 UNIT/ML ~~LOC~~ SOLN
0.0000 [IU] | Freq: Every day | SUBCUTANEOUS | Status: DC
  Administered 2015-05-19: 2 [IU] via SUBCUTANEOUS

## 2015-05-18 MED ORDER — INSULIN GLARGINE 100 UNIT/ML ~~LOC~~ SOLN
15.0000 [IU] | Freq: Once | SUBCUTANEOUS | Status: AC
  Administered 2015-05-18: 15 [IU] via SUBCUTANEOUS
  Filled 2015-05-18: qty 0.15

## 2015-05-18 MED ORDER — SODIUM CHLORIDE 0.9 % IV SOLN
1.0000 g | Freq: Once | INTRAVENOUS | Status: AC
  Administered 2015-05-18: 1 g via INTRAVENOUS
  Filled 2015-05-18: qty 10

## 2015-05-18 MED ORDER — INSULIN ASPART 100 UNIT/ML ~~LOC~~ SOLN
0.0000 [IU] | Freq: Three times a day (TID) | SUBCUTANEOUS | Status: DC
  Administered 2015-05-18: 8 [IU] via SUBCUTANEOUS
  Administered 2015-05-19 – 2015-05-20 (×2): 3 [IU] via SUBCUTANEOUS
  Administered 2015-05-20: 5 [IU] via SUBCUTANEOUS

## 2015-05-18 MED ORDER — OXYCODONE-ACETAMINOPHEN 5-325 MG PO TABS
1.0000 | ORAL_TABLET | Freq: Once | ORAL | Status: AC
  Administered 2015-05-18: 1 via ORAL
  Filled 2015-05-18: qty 1

## 2015-05-18 MED ORDER — SODIUM CHLORIDE 0.9 % IV BOLUS (SEPSIS)
500.0000 mL | Freq: Once | INTRAVENOUS | Status: AC
  Administered 2015-05-18: 500 mL via INTRAVENOUS

## 2015-05-18 MED ORDER — POTASSIUM CHLORIDE CRYS ER 20 MEQ PO TBCR
40.0000 meq | EXTENDED_RELEASE_TABLET | Freq: Once | ORAL | Status: AC
  Administered 2015-05-18: 40 meq via ORAL
  Filled 2015-05-18: qty 2

## 2015-05-18 NOTE — Progress Notes (Signed)
  Echocardiogram 2D Echocardiogram has been performed.  Darlina Sicilian M 05/18/2015, 2:46 PM

## 2015-05-18 NOTE — Evaluation (Addendum)
Occupational Therapy Evaluation Patient Details Name: [name] MRN: [id number] DOB: [birth year] Today's Date: 05/18/2015    History of Present Illness 49 yo female With history of type 1 diabetes, hypertension, anxiety, gastroparesis and diabetic neuropathy presented to the ED 05/17/15 with several hour history of nausea, nonbloody emesis, diffuse abdominal pain. . Patient states she's been compliant with her medications. Suspect DKA.   Clinical Impression   Pt was admitted for the above.  She will benefit from skilled OT to increase safety and independence with adls.  Pt was mod I with adls prior to admission; she currently needs up to max A.  Goals in acute are for supervision to min guard levels.  Pt does not have 24/7 at home    Follow Up Recommendations  SNF (vs HHOT depending upon progress)    Equipment Recommendations   (to be further assessed)    Recommendations for Other Services       Precautions / Restrictions Precautions Precautions: Fall      Mobility Bed Mobility Overal bed mobility: Needs Assistance Bed Mobility: Supine to Sit     Supine to sit: Min guard     General bed mobility comments: oob  Transfers Overall transfer level: Needs assistance Equipment used: 1 person hand held assist Transfers: Sit to/from [facility] Sit to Stand: Min assist Stand pivot transfers: Min assist       General transfer comment: cues for safety,    Balance Overall balance assessment: Needs assistance Sitting-balance support: Feet supported;No upper extremity supported Sitting balance-Leahy Scale: Good     Standing balance support: Single extremity supported;During functional activity Standing balance-Leahy Scale: Fair                              ADL Overall ADL's : Needs assistance/impaired             Lower Body Bathing: Moderate assistance;Sit to/from stand       Lower Body Dressing: Maximal assistance;Sit to/from  stand                 General ADL Comments: pt is able to perform UB adls with min A, only for lines.  She was limited by abdominal pain for LB adls at time of evaluation.  Educated on use of reacher.  Did not perform toilet transfer:  pt has catheter at this time     Vision     Perception     Praxis      Pertinent Vitals/Pain Pain Assessment: 0-10 Pain Score: 5  Pain Location: abdomen Pain Descriptors / Indicators: Aching Pain Intervention(s): Limited activity within patient's tolerance;Monitored during session;Premedicated before session;Repositioned     Hand Dominance     Extremity/Trunk Assessment Upper Extremity Assessment Upper Extremity Assessment: Generalized weakness (grip 4-/5--hard time opening things; R shoulder 3+/5; L 3/5). Educated on shoulder AROM; will bring ball and theraputty; theraband later   Lower Extremity Assessment per PT Lower Extremity Assessment: Generalized weakness;RLE deficits/detail;LLE deficits/detail RLE Deficits / Details: toes RLE Sensation: history of peripheral neuropathy LLE Deficits / Details: toes LLE Sensation: history of peripheral neuropathy   Cervical / Trunk Assessment Cervical / Trunk Assessment: Normal   Communication Communication Communication: No difficulties   Cognition Arousal/Alertness: Awake/alert Behavior During Therapy: WFL for tasks assessed/performed;Flat affect Overall Cognitive Status: Within Functional Limits for tasks assessed  General Comments       Exercises       Shoulder Instructions      Home Living Family/patient expects to be discharged to:: Private residence Living Arrangements: Children Available Help at Discharge:  (son works nights) Type of Home: Apartment Home Access: Stairs to enter Technical brewer of Steps: flight Entrance Stairs-Rails: Right;Left       Bathroom Shower/Tub: Teacher, early years/pre: Standard     Home  Equipment: None          Prior Functioning/Environment Level of Independence: Independent        Comments: reports she generally stays in apt, family assists with groceries.  Able to do adls but hard at times    OT Diagnosis: Generalized weakness   OT Problem List: Decreased strength;Decreased activity tolerance;Impaired balance (sitting and/or standing);Decreased knowledge of use of DME or AE;Pain   OT Treatment/Interventions: Self-care/ADL training;DME and/or AE instruction;Patient/family education;Balance training;Therapeutic exercise; energy conservation   OT Goals(Current goals can be found in the care plan section) Acute Rehab OT Goals Patient Stated Goal: to get up, move around. Have some cake (today is birthday) OT Goal Formulation: With patient Time For Goal Achievement: 05/25/15 Potential to Achieve Goals: Good ADL Goals Pt Will Perform Grooming: with supervision;standing Pt Will Perform Lower Body Bathing: with supervision;with adaptive equipment;sit to/from stand;with set-up Pt Will Perform Lower Body Dressing: with set-up;with supervision;with adaptive equipment;sit to/from stand Pt Will Transfer to Toilet: with supervision;ambulating;regular height toilet Pt Will Perform Tub/Shower Transfer: Tub transfer;with min guard assist;ambulating Additional ADL Goal #1: pt will be independent with bil UE strengthening program (theraputty bil hands), theraband level 1 RUE, AROM LUE  OT Frequency: Min 2X/week   Barriers to D/C:            Co-evaluation              End of Session    Activity Tolerance: Patient tolerated treatment well Patient left: in chair;with call bell/phone within reach   Time: 1009-1027 OT Time Calculation (min): 18 min Charges:  OT General Charges $OT Visit: 1 Procedure OT Evaluation $Initial OT Evaluation Tier I: 1 Procedure G-Codes:    Jatia Musa 06-02-2015, 11:13 AM  Lesle Chris, OTR/L [phone] 2015/06/02

## 2015-05-18 NOTE — Evaluation (Signed)
Physical Therapy Evaluation Patient Details Name: [name] MRN: [id number] DOB: [birth year] Today's Date: 05/18/2015   History of Present Illness  49 yo female With history of type 1 diabetes, hypertension, anxiety, gastroparesis and diabetic neuropathy presented to the ED 05/17/15 with several hour history of nausea, nonbloody emesis, diffuse abdominal pain. . Patient states she's been compliant with her medications. Suspect DKA.  Clinical Impression  Pt admitted with above diagnosis. Pt currently with functional limitations due to the deficits listed below (see PT Problem List).  Pt will benefit from skilled PT to increase their independence and safety with mobility to allow discharge to the venue listed below.  Patient is weak but is mobile. Reports that she is home alone most of the time.     Follow Up Recommendations Home health PT;SNF;Supervision/Assistance - 24 hour (depends on progress, lives in second floor apt.)    Equipment Recommendations  None recommended by PT    Recommendations for Other Services       Precautions / Restrictions Precautions Precautions: Fall      Mobility  Bed Mobility Overal bed mobility: Needs Assistance Bed Mobility: Supine to Sit     Supine to sit: Min guard     General bed mobility comments: extra time for lines and tubes  Transfers Overall transfer level: Needs assistance Equipment used: 1 person hand held assist Transfers: Sit to/from [facility] Sit to Stand: Min assist Stand pivot transfers: Min assist       General transfer comment: cues for safety,  Ambulation/Gait             General Gait Details: nt-has central line  Stairs            Wheelchair Mobility    Modified Rankin (Stroke Patients Only)       Balance Overall balance assessment: Needs assistance Sitting-balance support: Feet supported;No upper extremity supported Sitting balance-Leahy Scale: Good     Standing balance  support: Single extremity supported;During functional activity Standing balance-Leahy Scale: Fair                               Pertinent Vitals/Pain Pain Assessment: 0-10 Pain Score: 5  Pain Location: abdomen Pain Descriptors / Indicators: Cramping;Discomfort Pain Intervention(s): Limited activity within patient's tolerance;Premedicated before session    Home Living Family/patient expects to be discharged to:: Private residence Living Arrangements: Children Available Help at Discharge: Family Type of Home: Apartment Home Access: Stairs to enter Entrance Stairs-Rails: Psychiatric nurse of Steps: flight   Home Equipment: None      Prior Function Level of Independence: Independent         Comments: reports she generally stays in apt, family assists with groceries.     Hand Dominance        Extremity/Trunk Assessment   Upper Extremity Assessment: Defer to OT evaluation           Lower Extremity Assessment: Generalized weakness;RLE deficits/detail;LLE deficits/detail RLE Deficits / Details: toes LLE Deficits / Details: toes  Cervical / Trunk Assessment: Normal  Communication   Communication: No difficulties  Cognition Arousal/Alertness: Awake/alert Behavior During Therapy: WFL for tasks assessed/performed;Flat affect Overall Cognitive Status: Within Functional Limits for tasks assessed                      General Comments      Exercises        Assessment/Plan    PT  Assessment    PT Diagnosis Difficulty walking;Generalized weakness   PT Problem List    PT Treatment Interventions     PT Goals (Current goals can be found in the Care Plan section) Acute Rehab PT Goals Patient Stated Goal: to get up, move around. Have some cake. PT Goal Formulation: With patient Time For Goal Achievement: 06/01/15 Potential to Achieve Goals: Good    Frequency     Barriers to discharge        Co-evaluation                End of Session   Activity Tolerance: Patient tolerated treatment well Patient left: in chair;with call bell/phone within reach;with chair alarm set Nurse Communication: Mobility status         Time: [id number] PT Time Calculation (min) (ACUTE ONLY): 16 min   Charges:   PT Evaluation $Initial PT Evaluation Tier I: 1 Procedure     PT G CodesClaretha Cooper 05/18/2015, 9:48 AM  Tresa Endo PT [phone]

## 2015-05-18 NOTE — Consult Note (Signed)
Name: [name] MRN: [id number] DOB: May 25, 1966    ADMISSION DATE:  05/17/2015 CONSULTATION DATE:  11/23  REFERRING MD :  Grandville Silos  CHIEF COMPLAINT:  N/V, abd pain  BRIEF PATIENT DESCRIPTION: 48 y.o. female  brought to [facility] ED 11/22 with nausea, emesis, abd pain.  Lab work was suggestive of DKA.  She was admitted by [facility] and later that night, got progressively more hypotensive.  PCCM called for placement of CVL and assistance with medical management.Marland Kitchen  SIGNIFICANT EVENTS  11/22 - admitted with DKA.  STUDIES:  CXR 11/22 >>> no acute process.   HISTORY OF PRESENT ILLNESS:  [name] is a 49 y.o. female with a PMH as outlined below.  She presented to [facility] ED 11/22 with several hours of nausea, non-bloody emesis, and abdominal pain.  She had not had any fevers, chest pain, SOB, myalgias.  Of note, she has hx of gastroparesis and chronically has nausea with emesis and abdominal pain.  In ED, lab work was suggestive of DKA.  She was admitted by [facility] for further evaluation and management.  Over the course of the night, she got progressively more hypotensive.  PCCM was called for placement of CVL and for assistance with medical management.  PAST MEDICAL HISTORY :   has a past medical history of Hypertension; Cataract; Anxiety; Anemia; GERD (gastroesophageal reflux disease); Constipation (04/03/2012); Complication of anesthesia; PONV (postoperative nausea and vomiting); Asthma; Neuromuscular disorder ([place]); Gastroparesis; Diabetic neuropathy ([place]); History of blood product transfusion; Diabetes mellitus; and Panic attack.  has past surgical history that includes Retinal detachment surgery; POSTERIOR VITRECTOMY AND MEMBRANE PEEL-LEFT EYE (09/28/2002); POSTERIOR VITRECTOMY AND MEMBRANE PEEL-RIGHT EYE (03/16/2002); Eye surgery; gailstones; Esophagogastroduodenoscopy (09/27/2014); and Colonoscopy (09/27/2014). Prior to Admission medications   Medication Sig Start Date End Date Taking? Authorizing Provider   dronabinol (MARINOL) 10 MG capsule Take 10 mg by mouth 2 (two) times daily. 02/04/15  Yes Historical Provider, MD  ferrous sulfate 325 (65 FE) MG tablet Take 325 mg by mouth daily.    Yes Historical Provider, MD  FLUoxetine (PROZAC) 40 MG capsule Take 40 mg by mouth daily.   Yes Historical Provider, MD  furosemide (LASIX) 40 MG tablet Take 40 mg by mouth daily.   Yes Historical Provider, MD  hydrOXYzine (ATARAX/VISTARIL) 50 MG tablet Take 50 mg by mouth every 4 (four) hours as needed for anxiety.  05/05/15  Yes Historical Provider, MD  insulin aspart (NOVOLOG) 100 UNIT/ML injection Inject 0-15 Units into the skin 3 (three) times daily with meals. 6-15 units three times daily with food.  Per sliding scale 03/12/15  Yes Belkys A Regalado, MD  insulin detemir (LEVEMIR) 100 UNIT/ML injection Inject 0.15 mLs (15 Units total) into the skin at bedtime. 02/17/13  Yes Robbie Lis, MD  lisinopril (PRINIVIL,ZESTRIL) 10 MG tablet Take 10 mg by mouth daily. 10/13/14  Yes Historical Provider, MD  LORazepam (ATIVAN) 1 MG tablet Take 1 mg by mouth 3 (three) times daily.   Yes Historical Provider, MD  ondansetron (ZOFRAN ODT) 4 MG disintegrating tablet Take 1 tablet (4 mg total) by mouth every 8 (eight) hours as needed for nausea. 05/14/15  Yes Marella Chimes, PA-C  pantoprazole (PROTONIX) 40 MG tablet Take 40 mg by mouth 2 (two) times daily. 01/04/15  Yes Historical Provider, MD  potassium chloride (K-DUR) 10 MEQ tablet Take 1 tablet (10 mEq total) by mouth daily. 04/27/15  Yes Samantha Tripp Dowless, PA-C  potassium chloride SA (K-DUR,KLOR-CON) 20 MEQ tablet Take 2 tablets (40  mEq total) by mouth once. 04/09/15  Yes Nita Sells, MD  pregabalin (LYRICA) 150 MG capsule Take 1 capsule (150 mg total) by mouth 2 (two) times daily. 02/17/13  Yes Robbie Lis, MD  promethazine (PHENERGAN) 25 MG suppository Place 1 suppository (25 mg total) rectally every 6 (six) hours as needed for nausea or vomiting. 04/27/15   Yes Samantha Tripp Dowless, PA-C  promethazine (PHENERGAN) 25 MG tablet Take 1 tablet (25 mg total) by mouth every 6 (six) hours as needed for nausea or vomiting. 04/27/15  Yes Samantha Tripp Dowless, PA-C  ALPRAZolam (XANAX) 0.25 MG tablet Take 0.25 mg by mouth 3 (three) times daily as needed for anxiety.     Historical Provider, MD  ciprofloxacin (CIPRO) 500 MG tablet Take 1 tablet (500 mg total) by mouth 2 (two) times daily. Patient not taking: Reported on 04/27/2015 04/20/15   Kelvin Cellar, MD  prochlorperazine (COMPAZINE) 25 MG suppository Place 25 mg rectally every 12 (twelve) hours as needed for nausea or vomiting.  04/12/15 05/14/15  Historical Provider, MD   Allergies  Allergen Reactions  . Anesthetics, Amide Nausea And Vomiting  . Penicillins Diarrhea and Nausea And Vomiting    Has patient had a PCN reaction causing immediate rash, facial/tongue/throat swelling, SOB or lightheadedness with hypotension: No Has patient had a PCN reaction causing severe rash involving mucus membranes or skin necrosis: No Has patient had a PCN reaction that required hospitalization Yes  Has patient had a PCN reaction occurring within the last 10 years: Yes  If all of the above answers are "NO", then may proceed with Cephalosporin use.   . Buprenorphine Hcl Rash  . Encainide Nausea And Vomiting  . Metoclopramide Rash  . Morphine And Related Rash    FAMILY HISTORY:  family history includes Cystic fibrosis in her mother; Diabetes in her brother; Hypertension in her father and maternal grandmother. SOCIAL HISTORY:  reports that she has never smoked. She has never used smokeless tobacco. She reports that she does not drink alcohol or use illicit drugs.  REVIEW OF SYSTEMS:   All negative; except for those that are bolded, which indicate positives.  Constitutional: weight loss, weight gain, night sweats, fevers, chills, fatigue, weakness.  HEENT: headaches, sore throat, sneezing, nasal congestion,  post nasal drip, difficulty swallowing, tooth/dental problems, visual complaints, visual changes, ear aches. Neuro: difficulty with speech, weakness, numbness, ataxia. CV:  chest pain, orthopnea, PND, swelling in lower extremities, dizziness, palpitations, syncope.  Resp: cough, hemoptysis, dyspnea, wheezing. GI  heartburn, indigestion, abdominal pain, nausea, vomiting, diarrhea, constipation, change in bowel habits, loss of appetite, hematemesis, melena, hematochezia.  GU: dysuria, change in color of urine, urgency or frequency, flank pain, hematuria. MSK: joint pain or swelling, decreased range of motion. Psych: change in mood or affect, depression, anxiety, suicidal ideations, homicidal ideations. Skin: rash, itching, bruising.    SUBJECTIVE:  Denies chest pain, SOB, lightheadedness.  VITAL SIGNS: Temp:  [96.4 F (35.8 C)-98.4 F (36.9 C)] 96.4 F (35.8 C) (11/23 0100) Pulse Rate:  [85-120] 91 (11/23 0100) Resp:  [0-53] 15 (11/23 0100) BP: (61-171)/(30-115) 127/61 mmHg (11/23 0100) SpO2:  [97 %-100 %] 99 % (11/23 0100) Weight:  [56 kg (123 lb 7.3 oz)] 56 kg (123 lb 7.3 oz) (11/22 1225)  PHYSICAL EXAMINATION: General: Adult female, resting in bed, in NAD. Neuro: A&O x 3, follows basic commands. HEENT: [place]/AT. PERRL, sclerae anicteric. Cardiovascular: RRR, no M/R/G.  Lungs: Respirations even and unlabored.  CTA bilaterally, No W/R/R. Abdomen: BS x  4, soft, NT/ND.  Musculoskeletal: No gross deformities, no edema.  Skin: Intact, warm, no rashes.     Recent Labs Lab 05/17/15 1133 05/17/15 1702 05/17/15 2342  NA 137 140 141  K 4.4 3.7 3.6  CL 106 115* 118*  CO2 12* 17* 16*  BUN 16 13 13   CREATININE 1.24* 0.98 1.18*  GLUCOSE 469* 137* 114*    Recent Labs Lab 05/14/15 0633 05/17/15 0826  HGB 11.0* 10.1*  HCT 33.7* 31.6*  WBC 4.7 10.5  PLT 494* 432*   Dg Chest 2 View  05/17/2015  CLINICAL DATA:  Nausea, vomiting since last night.  DKA. EXAM: CHEST  2 VIEW  COMPARISON:  05/14/2015 FINDINGS: The heart size and mediastinal contours are within normal limits. Both lungs are clear. The visualized skeletal structures are unremarkable. IMPRESSION: No active cardiopulmonary disease. Electronically Signed   By: Rolm Baptise M.D.   On: 05/17/2015 11:50   Dg Chest Port 1 View  05/17/2015  CLINICAL DATA:  Right IJ line placement.  Initial encounter. EXAM: PORTABLE CHEST 1 VIEW COMPARISON:  Chest radiograph performed earlier today at 11:23 a.m. FINDINGS: The patient's right IJ line is noted ending overlying the right atrium. This could be retracted approximately 3 cm to the cavoatrial junction. The lungs are relatively well expanded and appear grossly clear. No focal consolidation, pleural effusion or pneumothorax is seen. The cardiomediastinal silhouette is normal in size. No acute osseous abnormalities are identified. Clips are noted within the right upper quadrant, reflecting prior cholecystectomy. IMPRESSION: 1. Right IJ line noted ending overlying the right atrium. This could be retracted approximately 3 cm to the cavoatrial junction. 2. No acute cardiopulmonary process seen. Electronically Signed   By: Garald Balding M.D.   On: 05/17/2015 21:45   Dg Abd 2 Views  05/17/2015  CLINICAL DATA:  Nausea and vomiting with hyperglycemia EXAM: ABDOMEN - 2 VIEW COMPARISON:  CT abdomen and pelvis May 14, 2015 FINDINGS: Supine and left lateral decubitus images were obtained. There is diffuse stool throughout the colon. No bowel dilatation or air-fluid level suggesting obstruction. No free air. There are surgical clips the right upper quadrant. Visualized lung bases are clear. No abnormal calcifications. IMPRESSION: No demonstrable obstruction or free air. Diffuse stool throughout colon. Electronically Signed   By: Lowella Grip III M.D.   On: 05/17/2015 11:45    ASSESSMENT / PLAN:  DKA. Plan: Continue NPO. Continue IVF and insulin per DKA protocol. F/u Hgb  A1c.  Hypotension - etiology unclear but favor hypovolemic from her hx of nausea with emesis as well as DKA. Plan: Goal CVP 10 - 12. Continue levophed as needed to maintain goal MAP > 65. Defer abx for now. Will check PCT, if high then will pan culture and empirically cover with broad spectrum abx.  AKI. Hypocalcemia. Plan: Continue IVF per DKA protocol. 1g Ca gluconate. Assess ionized calcium.  Rest per primary team.  Montey Hora, PA - C  Pulmonary & Critical Care Medicine Pager: [phone]  or [phone]  Attending Note:  49 year old female with history of diabetes presenting with DKA and found to be hypotensive.  PCCM was consulted for management of hypotension.  Placed TLC in patient for CVP.  Patient has been fluid resuscitated in my opinion since CVP is 15.  Her mental status remains poor and lungs are clear.  Will continue fluid resuscitation.  Add levophed for BP support.  WBC is normal.  She is afebrile and  I see no signs of infection.  Will hold of abx for now.  Check PCT if elevated will add abx.  Will check echo since CVP is 15 and patient remains hypotensive.  Will continue CVP monitoring and continue fluid resuscitation.  Replace electrolytes and continue DKA protocol.  The patient is critically ill with multiple organ systems failure and requires high complexity decision making for assessment and support, frequent evaluation and titration of therapies, application of advanced monitoring technologies and extensive interpretation of multiple databases.   Critical Care Time devoted to patient care services described in this note is  35  Minutes. This time reflects time of care of this signee Dr Jennet Maduro. This critical care time does not reflect procedure time, or teaching time or supervisory time of PA/NP/Med student/Med Resident etc but could involve care discussion time.  Rush Farmer, M.D. [facility] Pulmonary/Critical Care Medicine. Pager:  [phone]. After hours pager: [phone].  05/18/2015, 1:05 AM

## 2015-05-18 NOTE — Progress Notes (Signed)
Name: [name] MRN: [id number] DOB: 1966/01/06    ADMISSION DATE:  05/17/2015 CONSULTATION DATE:  11/23  REFERRING MD :  Grandville Silos  CHIEF COMPLAINT:  N/V, abd pain  BRIEF PATIENT DESCRIPTION: 49 y.o. female  brought to [facility] ED 11/22 with nausea, emesis, abd pain.  Lab work was suggestive of DKA.  She was admitted by [facility] and later that night, got progressively more hypotensive.  PCCM called for placement of CVL and assistance with medical management.Marland Kitchen  SIGNIFICANT EVENTS  11/22 - admitted with DKA.  STUDIES:  CXR 11/22 >>> no acute process.   SUBJECTIVE:  Hypotensive overnight, improved this am, off pressors since 3am.  Gap closed, nearing ready to come off IV insulin.    VITAL SIGNS: Temp:  [96.4 F (35.8 C)-98.4 F (36.9 C)] 97.9 F (36.6 C) (11/23 0800) Pulse Rate:  [85-120] 89 (11/23 0800) Resp:  [0-53] 21 (11/23 0800) BP: (61-156)/(30-72) 112/51 mmHg (11/23 0800) SpO2:  [97 %-100 %] 98 % (11/23 0800) Weight:  [123 lb 7.3 oz (56 kg)] 123 lb 7.3 oz (56 kg) (11/22 1225)  PHYSICAL EXAMINATION: General: Adult female,NAD sitting OOB in chair.  Neuro: A&O x 3, awake,alert, appropriate.  HEENT: [place]/AT. PERRL, sclerae anicteric. Cardiovascular: RRR, no M/R/G.  Lungs: Respirations even and unlabored on RA.  CTA bilaterally, No W/R/R. Abdomen: BS x 4, soft, NT/ND.  Musculoskeletal: No gross deformities, no edema.  Skin: Intact, warm, no rashes.     Recent Labs Lab 05/17/15 2342 05/18/15 0330 05/18/15 0600  NA 141 140 140  K 3.6 3.7 3.5  CL 118* 115* 116*  CO2 16* 16* 19*  BUN 13 11 10   CREATININE 1.18* 1.08* 0.98  GLUCOSE 114* 216* 224*    Recent Labs Lab 05/14/15 0633 05/17/15 0826 05/18/15 0330  HGB 11.0* 10.1* 8.3*  HCT 33.7* 31.6* 25.9*  WBC 4.7 10.5 10.1  PLT 494* 432* 363   Dg Chest 2 View  05/17/2015  CLINICAL DATA:  Nausea, vomiting since last night.  DKA. EXAM: CHEST  2 VIEW COMPARISON:  05/14/2015 FINDINGS: The heart size and mediastinal  contours are within normal limits. Both lungs are clear. The visualized skeletal structures are unremarkable. IMPRESSION: No active cardiopulmonary disease. Electronically Signed   By: Rolm Baptise M.D.   On: 05/17/2015 11:50   Dg Chest Port 1 View  05/17/2015  CLINICAL DATA:  Right IJ line placement.  Initial encounter. EXAM: PORTABLE CHEST 1 VIEW COMPARISON:  Chest radiograph performed earlier today at 11:23 a.m. FINDINGS: The patient's right IJ line is noted ending overlying the right atrium. This could be retracted approximately 3 cm to the cavoatrial junction. The lungs are relatively well expanded and appear grossly clear. No focal consolidation, pleural effusion or pneumothorax is seen. The cardiomediastinal silhouette is normal in size. No acute osseous abnormalities are identified. Clips are noted within the right upper quadrant, reflecting prior cholecystectomy. IMPRESSION: 1. Right IJ line noted ending overlying the right atrium. This could be retracted approximately 3 cm to the cavoatrial junction. 2. No acute cardiopulmonary process seen. Electronically Signed   By: Garald Balding M.D.   On: 05/17/2015 21:45   Dg Abd 2 Views  05/17/2015  CLINICAL DATA:  Nausea and vomiting with hyperglycemia EXAM: ABDOMEN - 2 VIEW COMPARISON:  CT abdomen and pelvis May 14, 2015 FINDINGS: Supine and left lateral decubitus images were obtained. There is diffuse stool throughout the colon. No bowel dilatation or air-fluid level suggesting obstruction. No free air. There are surgical clips  the right upper quadrant. Visualized lung bases are clear. No abnormal calcifications. IMPRESSION: No demonstrable obstruction or free air. Diffuse stool throughout colon. Electronically Signed   By: Lowella Grip III M.D.   On: 05/17/2015 11:45    ASSESSMENT / PLAN:  DKA - improving.  Gap closed 11/23 am.   Plan: Continue NPO. Continue IVF and insulin gtt - wean off per DKA protocol. F/u Hgb A1c.  Hypotension  - etiology unclear but favor hypovolemic from her hx of nausea with emesis as well as DKA. Much improved 11/23.  Plan: Goal CVP 10 - 12. Defer abx for now. Pct low.   AKI. Hypocalcemia. Plan: Continue IVF per DKA protocol.   Rest per primary team.   PCCM signing off please call back if needed.    Nickolas Madrid, NP 05/18/2015  9:55 AM Pager: [phone] or [phone]

## 2015-05-18 NOTE — Progress Notes (Signed)
Patient ID: [name], female   DOB: July 24, 1965, 49 y.o.   MRN: [id number] [facility] PROGRESS NOTE  [name] [id number]:[id number] DOB: Nov 29, 1965 DOA: 05/17/2015 PCP: Barbette Merino, MD  Brief narrative:    49 y.o. Female with past medical history of type 1 diabetes on insulin, gastroparesis, neuropathy, anxiety and depression who presented to [facility] ED with diffuse abdominal pain, nausea and vomiting for few hours prior to this admission. No fevers.  In ED, she was initially hemodynamically stable. She was found to be in DKA and started on insulin drip.  Overnight became hypotensive and required short course of pressors but off pressors since 3 am.  Assessment/Plan:    Principal Problem:   Diabetic ketoacidosis without coma associated with type 1 diabetes mellitus ([place]) / High anion gap metabolic acidosis / Uncontrolled type 2 diabetes mellitus with diabetic neuropathy, with long-term current use of insulin (HCC) - DKA criteria met on admission with CBG 435, glucose on BMP 469, ketone in urine, AG of 24. Started on insulin drip per DKA protocol - Stop insulin drip once AG closes or CO2 normalizes - BMP and CBG per DKA protocol - Continue IV fluids - Check A1c   Active Problems:   Nausea & vomiting - Due to DKA - Continue supportive care with fluids and antiemetics as needed      Hypotension - Likely hypovolemia - Had brief run with levophed but stopped since 3 am - HD stable at this time - Off pressors - No evidence of sepsis    Gastroparesis due to DM (HCC) - Stable - Continue antiemetics as needed for N/V    Hypokalemia - From insulin - Supplemented     CKD (chronic kidney disease) stage 3, GFR 30-59 ml/min - Baseline Cr 1.4 and on this admission within baseline values    Anemia of chronic kidney failure - Hemoglobin 8.3 - No bleeding - Follow up CBC tomorrow am     Elevated troponin - Likely demand ischemia from hypotension - Cycle trop - If trend up  then call cardio - Obtain 12 lead EKG. On admission 12 lead EKG with sinus tachycardia.  - 2 D ECHO pending     Anxiety and depression - Continue prozac and ativan    DVT Prophylaxis  - Lovenox subQ   Code Status: Full.  Family Communication:  plan of care discussed with the patient Disposition Plan: On insulin drip so will stay in SDU. Off pressors since 3 am.   IV access:  Peripheral IV CVL  Procedures and diagnostic studies:    Dg Chest 2 View 05/17/2015  No active cardiopulmonary disease. Electronically Signed   By: Rolm Baptise M.D.   On: 05/17/2015 11:50   Dg Chest Port 1 View 05/17/2015  1. Right IJ line noted ending overlying the right atrium. This could be retracted approximately 3 cm to the cavoatrial junction. 2. No acute cardiopulmonary process seen. Electronically Signed   By: Garald Balding M.D.   On: 05/17/2015 21:45   Dg Abd 2 Views 05/17/2015  No demonstrable obstruction or free air. Diffuse stool throughout colon. Electronically Signed   By: Lowella Grip III M.D.   On: 05/17/2015 11:45    Medical Consultants:  PCCM  Other Consultants:  None   IAnti-Infectives:   None    Leisa Lenz, MD  [facility] Pager [phone]  Time spent in minutes: 25 minutes  If 7PM-7AM, please contact night-coverage [email or web address] Password [email or web address] 05/18/2015, 4:08 PM   LOS: 1 day  HPI/Subjective: No acute overnight events. Patient reports feeling better.  Objective: Filed Vitals:   05/18/15 1300 05/18/15 1400 05/18/15 1500 05/18/15 1555  BP: 130/71   149/51  Pulse: 88 95 90 95  Temp: 97.7 F (36.5 C) 98.1 F (36.7 C) 98.1 F (36.7 C)   TempSrc:      Resp: 29 23 16 11   Height:      Weight:      SpO2: 100% 100% 99% 100%    Intake/Output Summary (Last 24 hours) at 05/18/15 1608 Last data filed at 05/18/15 1600  Gross per 24 hour  Intake 7521.51 ml  Output   1745 ml  Net 5776.51 ml    Exam:   General:  Pt is alert, follows commands  appropriately, not in acute distress  Cardiovascular: Regular rate and rhythm, S1/S2, no murmurs  Respiratory: Clear to auscultation bilaterally, no wheezing, no crackles, no rhonchi  Abdomen: Soft, non tender, non distended, bowel sounds present  Extremities: No edema, pulses DP and PT palpable bilaterally  Neuro: Grossly nonfocal  Data Reviewed: Basic Metabolic Panel:  Recent Labs Lab 05/17/15 1133 05/17/15 1702 05/17/15 2342 05/18/15 0330 05/18/15 0600 05/18/15 1005  NA 137 140 141 140 140 140  K 4.4 3.7 3.6 3.7 3.5 3.1*  CL 106 115* 118* 115* 116* 115*  CO2 12* 17* 16* 16* 19* 20*  GLUCOSE 469* 137* 114* 216* 224* 93  BUN 16 13 13 11 10 10   CREATININE 1.24* 0.98 1.18* 1.08* 0.98 0.96  CALCIUM 8.5* 8.4* 7.8* 8.0* 7.8* 8.1*  MG 1.6*  --   --  2.2  --   --   PHOS 4.1  --   --   --   --   --    Liver Function Tests:  Recent Labs Lab 05/14/15 0633 05/17/15 0826  AST 25 22  ALT 43 31  ALKPHOS 103 105  BILITOT 1.0 2.0*  PROT 7.9 7.7  ALBUMIN 3.9 4.1    Recent Labs Lab 05/14/15 0633 05/17/15 0826  LIPASE 61* 28   No results for input(s): AMMONIA in the last 168 hours. CBC:  Recent Labs Lab 05/14/15 0633 05/17/15 0826 05/18/15 0330  WBC 4.7 10.5 10.1  NEUTROABS 3.3  --   --   HGB 11.0* 10.1* 8.3*  HCT 33.7* 31.6* 25.9*  MCV 86.0 86.1 88.4  PLT 494* 432* 363   Cardiac Enzymes:  Recent Labs Lab 05/17/15 1133 05/17/15 2342 05/18/15 0330  TROPONINI <0.03 0.30* 0.43*   BNP: Invalid input(s): POCBNP CBG:  Recent Labs Lab 05/18/15 0902 05/18/15 1004 05/18/15 1106 05/18/15 1208 05/18/15 1313  GLUCAP 112* 83 98 114* 127*    Recent Results (from the past 240 hour(s))  Urine culture     Status: None (Preliminary result)   Collection Time: 05/17/15 11:18 AM  Result Value Ref Range Status   Specimen Description URINE, CLEAN CATCH  Final   Special Requests NONE  Final   Culture   Final    TOO YOUNG TO READ Performed at [facility]    Report Status PENDING  Incomplete  MRSA PCR Screening     Status: None   Collection Time: 05/17/15 12:23 PM  Result Value Ref Range Status   MRSA by PCR NEGATIVE NEGATIVE Final    Comment:        The GeneXpert MRSA Assay (FDA approved for NASAL specimens only), is one component of a comprehensive MRSA colonization surveillance program. It is not intended to diagnose  MRSA infection nor to guide or monitor treatment for MRSA infections.      Scheduled Meds: . docusate sodium  100 mg Oral BID  . dronabinol  10 mg Oral BID  . enoxaparin (LOVENOX) injection  40 mg Subcutaneous Q24H  . ferrous sulfate  325 mg Oral Daily  . FLUoxetine  40 mg Oral Daily  . insulin aspart  0-15 Units Subcutaneous TID WC  . insulin aspart  0-5 Units Subcutaneous QHS  . LORazepam  1 mg Oral TID  . pantoprazole (PROTONIX) IV  40 mg Intravenous Q24H  . polyethylene glycol  17 g Oral BID  . pregabalin  150 mg Oral BID  . sodium chloride  3 mL Intravenous Q12H   Continuous Infusions: . sodium chloride Stopped (05/17/15 1308)  . sodium chloride Stopped (05/17/15 1418)  . dextrose 5 % and 0.45% NaCl 200 mL/hr at 05/18/15 1600  . norepinephrine (LEVOPHED) Adult infusion Stopped (05/18/15 0356)

## 2015-05-18 NOTE — Progress Notes (Signed)
Shift event note:  Notified by RN regarding persistent hypotension since just before 1900 w/ SBP's in 60-70's. Minimal improvement after 1L NS. Pt then had access issues as only had (L) EJ and IV team unable to obtain additional access. Necessary to stop insulin qtt to bolus pt. Additional 1L NS rec'd w/o significant improvement in BP. Discussed pt w/ Dr Lamonte Sakai w/ [facility] who is aware of pt and is sending CCM provider to place CVL. At bedside pt noted responsive but quite lethargic w/ marginal UOP. Montey Hora, PA w/ ccm service arrived at bedside and pt was prepared for placement of CVL.  Assessment/Plan: 1. Hypotension: Presumed associated w/ h/o nausea and DKA. Will resume fluid resuscitation when CVL placed. Will defer to CCM if pressors needed. 2. DKA: Insulin qtt to be restarted and DKA protocol resumed after placement of CVL. Pt's level of care has been increased to ICU status given change in status and associated increased acuity. Will continue to monitor closely in ICU.   Jeryl Columbia, NP-C [facility] Pager [phone]

## 2015-05-18 NOTE — Progress Notes (Signed)
Inpatient Diabetes Program Recommendations  AACE/ADA: New Consensus Statement on Inpatient Glycemic Control (2015)  Target Ranges:  Prepandial:   less than 140 mg/dL      Peak postprandial:   less than 180 mg/dL (1-2 hours)      Critically ill patients:  140 - 180 mg/dL   Admit with: Gastroparesis/ DKA (glucose 469 mg/dl, CO2 13, Anion Gap 24 on initial BMET)  History: Type 1 DM, Gastropaersis  Home DM Meds: Levemir 15 units QHS  Novolog 6-15 units tid per SSI  Current Insulin Orders: IV Insulin drip per DKA orders    -Patient well known to the Inpatient Glycemic Control Team. This is her 7th admission since April of 2016. Also of note, patient has had 12 ED visits since April as well  -I spoke with this patient back on 02/10/15 when she was admitted to [facility] hospital for DKA. Patient's son told me she was planning to seek care under a GI Specialist and an Endocrinologist with [facility], it looks as if patient has sought care under a GI Specialist with [facility]. I do not see where patient has established care with an Endocrinologist (which she needs to do given her DM complications and labile CBGs).  -Note patient started on IV insulin drip yesterday (11/22) at 11am per DKA orders.  Also note patient with severe hypotension last PM requiring several IVF boluses.  -BMET from 6am this morning shows improvement in acidosis- CO2 up to 19 and Anion Gap down to 5.     MD- When you decide to transition patient off the IV insulin drip to SQ insulin, please make sure patient receives her home dose of Levemir 15 units at least 1 hour prior to discontinuation of IV insulin drip.     --Will follow patient during hospitalization--  Wyn Quaker RN, MSN, CDE Diabetes Coordinator Inpatient Glycemic Control Team Team Pager: [phone] (8a-5p)

## 2015-05-19 DIAGNOSIS — I959 Hypotension, unspecified: Secondary | ICD-10-CM

## 2015-05-19 LAB — BASIC METABOLIC PANEL
Anion gap: 4 — ABNORMAL LOW (ref 5–15)
BUN: 6 mg/dL (ref 6–20)
CALCIUM: 7.7 mg/dL — AB (ref 8.9–10.3)
CHLORIDE: 109 mmol/L (ref 101–111)
CO2: 19 mmol/L — AB (ref 22–32)
CREATININE: 0.95 mg/dL (ref 0.44–1.00)
GFR calc non Af Amer: >60 mL/min (ref >60)
Glucose, Bld: 64 mg/dL — ABNORMAL LOW (ref 65–99)
Potassium: 3.4 mmol/L — ABNORMAL LOW (ref 3.5–5.1)
SODIUM: 132 mmol/L — AB (ref 135–145)

## 2015-05-19 LAB — GLUCOSE, CAPILLARY
GLUCOSE-CAPILLARY: 75 mg/dL (ref 65–99)
GLUCOSE-CAPILLARY: 72 mg/dL (ref 65–99)
GLUCOSE-CAPILLARY: 97 mg/dL (ref 65–99)
GLUCOSE-CAPILLARY: 221 mg/dL — AB (ref 65–99)
Glucose-Capillary: 64 mg/dL — ABNORMAL LOW (ref 65–99)
Glucose-Capillary: 196 mg/dL — ABNORMAL HIGH (ref 65–99)

## 2015-05-19 LAB — HEMOGLOBIN A1C
Hgb A1c MFr Bld: 7.3 % — ABNORMAL HIGH (ref 4.8–5.6)
Mean Plasma Glucose: 163 mg/dL

## 2015-05-19 LAB — TROPONIN I
TROPONIN I: 0.44 ng/mL — AB (ref <0.031)
Troponin I: 0.47 ng/mL — ABNORMAL HIGH (ref <0.031)

## 2015-05-19 LAB — PROCALCITONIN: Procalcitonin: 0.2 ng/mL

## 2015-05-19 MED ORDER — FUROSEMIDE 40 MG PO TABS
40.0000 mg | ORAL_TABLET | Freq: Every day | ORAL | Status: DC
  Administered 2015-05-19 – 2015-05-20 (×2): 40 mg via ORAL
  Filled 2015-05-19 (×2): qty 1

## 2015-05-19 MED ORDER — SODIUM CHLORIDE 0.9 % IV SOLN
1000.0000 mL | INTRAVENOUS | Status: DC
  Administered 2015-05-20: 1000 mL via INTRAVENOUS

## 2015-05-19 MED ORDER — LISINOPRIL 10 MG PO TABS
10.0000 mg | ORAL_TABLET | Freq: Every day | ORAL | Status: DC
  Administered 2015-05-19 – 2015-05-20 (×2): 10 mg via ORAL
  Filled 2015-05-19 (×2): qty 1

## 2015-05-19 MED ORDER — POTASSIUM CHLORIDE CRYS ER 20 MEQ PO TBCR
40.0000 meq | EXTENDED_RELEASE_TABLET | Freq: Once | ORAL | Status: AC
  Administered 2015-05-19: 40 meq via ORAL
  Filled 2015-05-19: qty 2

## 2015-05-19 NOTE — Progress Notes (Signed)
Patient ID: [name], female   DOB: [birth year], 49 y.o.   MRN: [id number] [facility] PROGRESS NOTE  Verne C[place] BZJ:[id number] DOB: 03/24/1966 DOA: 05/17/2015 PCP: Barbette Merino, MD  Brief narrative:    49 y.o. Female with past medical history of type 1 diabetes on insulin, gastroparesis, neuropathy, anxiety and depression who presented to [facility] ED with diffuse abdominal pain, nausea and vomiting for few hours prior to this admission. No fevers.  In ED, she was initially hemodynamically stable. She was found to be in DKA and started on insulin drip. She was also found to be hypotensive couple of hours after admission so she required brief course of pressors but they were stopped 11/23 early in am.  Stable for transfer to telemetry today 05/19/2015.  Assessment/Plan:    Principal Problem:   Diabetic ketoacidosis without coma associated with type 1 diabetes mellitus ([place]) / High anion gap metabolic acidosis / Uncontrolled type 2 diabetes mellitus with diabetic neuropathy, with long-term current use of insulin ([place]) - DKA criteria were met on admission with CBG 435, glucose on BMP 469, ketone in urine, AG of 24. Started on insulin drip per DKA protocol - Insulin drip stopped the morning of 11/23 - CBG's in past 24 hours: 64, 72, 196 - Pt on home insulin regimen along with SSI - A1c is 7.3, slightly above goal range - Transfer to telemetry today    Active Problems:   Nausea & vomiting - Likely due to DKA - Improved since DKA resolved - Tolerates regular diet     Hypotension - Likely hypovolemia - No evidence of acute infectious process - No fevers and normal WBC count  - UA unremarkable - CXR showed no acute cardiopulmonary findings     Gastroparesis due to DM (HCC) - Stable    Hypokalemia - From insulin - Supplemented     CKD (chronic kidney disease) stage 3, GFR 30-59 ml/min - Baseline Cr 1.4 and on this admission within baseline values    Anemia of chronic  kidney failure - Hemoglobin 8.3, stable     Elevated troponin - Likely demand ischemia from hypotension - Trop 0.48 --> 0.44 --> 0.47 - 2 D ECHO with normal EF nad no regional wall motion abnormalities     Anxiety and depression - Continue prozac and ativan  - Stable - Not depressed   DVT Prophylaxis  - Lovenox subQ in hospital   Code Status: Full.  Family Communication:  plan of care discussed with the patient Disposition Plan: transfer to tele. D/C tomorrow 11/25.  IV access:  Peripheral IV CVL  Procedures and diagnostic studies:    Dg Chest 2 View 05/17/2015  No active cardiopulmonary disease. Electronically Signed   By: Rolm Baptise M.D.   On: 05/17/2015 11:50   Dg Chest Port 1 View 05/17/2015  1. Right IJ line noted ending overlying the right atrium. This could be retracted approximately 3 cm to the cavoatrial junction. 2. No acute cardiopulmonary process seen. Electronically Signed   By: Garald Balding M.D.   On: 05/17/2015 21:45   Dg Abd 2 Views 05/17/2015  No demonstrable obstruction or free air. Diffuse stool throughout colon. Electronically Signed   By: Lowella Grip III M.D.   On: 05/17/2015 11:45    Medical Consultants:  PCCM  Other Consultants:  None   IAnti-Infectives:   None    Leisa Lenz, MD  [facility] Pager [phone]  Time spent in minutes: 25 minutes  If 7PM-7AM, please contact night-coverage  [email or web address] Password [email or web address] 05/19/2015, 8:22 AM   LOS: 2 days    HPI/Subjective: No acute overnight events. Patient reports no N/V.  Objective: Filed Vitals:   05/19/15 0500 05/19/15 0600 05/19/15 0700 05/19/15 0800  BP:  145/62  156/88  Pulse: 88 89 87 93  Temp: 98.8 F (37.1 C) 98.8 F (37.1 C) 98.6 F (37 C) 98.4 F (36.9 C)  TempSrc:   Core (Comment)   Resp: 16 16 14 14   Height:      Weight:      SpO2: 97% 93% 96% 100%    Intake/Output Summary (Last 24 hours) at 05/19/15 9371 Last data filed at 05/19/15 0800  Gross  per 24 hour  Intake 3703.53 ml  Output    630 ml  Net 3073.53 ml    Exam:   General:  Pt is alert, not in acute distress  Cardiovascular: Regular rate and rhythm, S1/S2 (+)  Respiratory: No wheezing, no crackles, no rhonchi  Abdomen: (+) BS, non tender   Extremities: No leg swelling, palpable pulses   Neuro: Nonfocal  Data Reviewed: Basic Metabolic Panel:  Recent Labs Lab 05/17/15 1133  05/17/15 2342 05/18/15 0330 05/18/15 0600 05/18/15 1005 05/19/15 0515  NA 137  < > 141 140 140 140 132*  K 4.4  < > 3.6 3.7 3.5 3.1* 3.4*  CL 106  < > 118* 115* 116* 115* 109  CO2 12*  < > 16* 16* 19* 20* 19*  GLUCOSE 469*  < > 114* 216* 224* 93 64*  BUN 16  < > 13 11 10 10 6   CREATININE 1.24*  < > 1.18* 1.08* 0.98 0.96 0.95  CALCIUM 8.5*  < > 7.8* 8.0* 7.8* 8.1* 7.7*  MG 1.6*  --   --  2.2  --   --   --   PHOS 4.1  --   --   --   --   --   --   < > = values in this interval not displayed. Liver Function Tests:  Recent Labs Lab 05/14/15 6967 05/17/15 0826  AST 25 22  ALT 43 31  ALKPHOS 103 105  BILITOT 1.0 2.0*  PROT 7.9 7.7  ALBUMIN 3.9 4.1    Recent Labs Lab 05/14/15 0633 05/17/15 0826  LIPASE 61* 28   No results for input(s): AMMONIA in the last 168 hours. CBC:  Recent Labs Lab 05/14/15 0633 05/17/15 0826 05/18/15 0330  WBC 4.7 10.5 10.1  NEUTROABS 3.3  --   --   HGB 11.0* 10.1* 8.3*  HCT 33.7* 31.6* 25.9*  MCV 86.0 86.1 88.4  PLT 494* 432* 363   Cardiac Enzymes:  Recent Labs Lab 05/17/15 2342 05/18/15 0330 05/18/15 1715 05/18/15 2320 05/19/15 0515  TROPONINI 0.30* 0.43* 0.48* 0.44* 0.47*   BNP: Invalid input(s): POCBNP CBG:  Recent Labs Lab 05/18/15 2159 05/18/15 2241 05/19/15 0324 05/19/15 0734 05/19/15 0810  GLUCAP 65 91 75 64* 72    Recent Results (from the past 240 hour(s))  Urine culture     Status: None   Collection Time: 05/17/15 11:18 AM  Result Value Ref Range Status   Specimen Description URINE, CLEAN CATCH  Final    Special Requests NONE  Final   Culture   Final    MULTIPLE SPECIES PRESENT, SUGGEST RECOLLECTION Performed at [facility]    Report Status 05/18/2015 FINAL  Final  MRSA PCR Screening     Status: None   Collection Time: 05/17/15 12:23 PM  Result Value Ref Range Status   MRSA by PCR NEGATIVE NEGATIVE Final    Comment:        The GeneXpert MRSA Assay (FDA approved for NASAL specimens only), is one component of a comprehensive MRSA colonization surveillance program. It is not intended to diagnose MRSA infection nor to guide or monitor treatment for MRSA infections.      Scheduled Meds: . docusate sodium  100 mg Oral BID  . dronabinol  10 mg Oral BID  . enoxaparin (LOVENOX) injection  40 mg Subcutaneous Q24H  . ferrous sulfate  325 mg Oral Daily  . FLUoxetine  40 mg Oral Daily  . insulin aspart  0-15 Units Subcutaneous TID WC  . insulin aspart  0-5 Units Subcutaneous QHS  . LORazepam  1 mg Oral TID  . pantoprazole (PROTONIX) IV  40 mg Intravenous Q24H  . polyethylene glycol  17 g Oral BID  . pregabalin  150 mg Oral BID  . sodium chloride  3 mL Intravenous Q12H   Continuous Infusions: . sodium chloride 1,000 mL (05/18/15 1800)  . sodium chloride 150 mL/hr at 05/18/15 2329  . dextrose 5 % and 0.45% NaCl Stopped (05/18/15 1601)  . norepinephrine (LEVOPHED) Adult infusion Stopped (05/18/15 0356)

## 2015-05-19 NOTE — Progress Notes (Signed)
[name] 1231 TRANSFERRED TO 5TH FLOOR- REPORT WAS GIVEN TO DAVID RN. NO APPARENT CHANGE IN PATIENT'S CONDITION. SHE WENT TO NEW ROOM IN W/CHAIR WITH NT. FOR TELE.

## 2015-05-20 LAB — GLUCOSE, CAPILLARY
GLUCOSE-CAPILLARY: 249 mg/dL — AB (ref 65–99)
GLUCOSE-CAPILLARY: 183 mg/dL — AB (ref 65–99)

## 2015-05-20 LAB — PROCALCITONIN: Procalcitonin: <0.1 ng/mL

## 2015-05-20 LAB — CALCIUM, IONIZED: Calcium, Ionized, Serum: 4.8 mg/dL (ref 4.5–5.6)

## 2015-05-20 MED ORDER — ALPRAZOLAM 0.25 MG PO TABS: 0.2500 mg | ORAL_TABLET | Freq: Three times a day (TID) | ORAL | Status: DC | PRN

## 2015-05-20 MED ORDER — PANTOPRAZOLE SODIUM 40 MG PO TBEC
40.0000 mg | DELAYED_RELEASE_TABLET | Freq: Every day | ORAL | Status: DC
  Administered 2015-05-20: 40 mg via ORAL
  Filled 2015-05-20: qty 1

## 2015-05-20 MED ORDER — ONDANSETRON 4 MG PO TBDP: 4.0000 mg | ORAL_TABLET | Freq: Three times a day (TID) | ORAL | Status: DC | PRN

## 2015-05-20 MED ORDER — SODIUM CHLORIDE 0.9 % IJ SOLN
10.0000 mL | INTRAMUSCULAR | Status: DC | PRN
  Administered 2015-05-20: 10 mL
  Filled 2015-05-20: qty 40

## 2015-05-20 NOTE — Discharge Instructions (Signed)
Diabetic Ketoacidosis °Diabetic ketoacidosis is a life-threatening complication of diabetes. If it is not treated, it can cause severe dehydration and organ damage and can lead to a coma or death. °CAUSES °This condition develops when there is not enough of the hormone insulin in the body. Insulin helps the body to break down sugar for energy. Without insulin, the body cannot break down sugar, so it breaks down fats instead. This leads to the production of acids that are called ketones. Ketones are poisonous at high levels. °This condition can be triggered by: °· Stress on the body that is brought on by an illness. °· Medicines that raise blood glucose levels. °· Not taking diabetes medicine. °SYMPTOMS °Symptoms of this condition include: °· Fatigue. °· Weight loss. °· Excessive thirst. °· Light-headedness. °· Fruity or sweet-smelling breath. °· Excessive urination. °· Vision changes. °· Confusion or irritability. °· Nausea. °· Vomiting. °· Rapid breathing. °· Abdominal pain. °· Feeling flushed. °DIAGNOSIS °This condition is diagnosed based on a medical history, a physical exam, and blood tests. You may also have a urine test that checks for ketones. °TREATMENT °This condition may be treated with: °· Fluid replacement. This may be done to correct dehydration. °· Insulin injections. These may be given through the skin or through an IV tube. °· Electrolyte replacement. Electrolytes, such as potassium and sodium, may be given in pill form or through an IV tube. °· Antibiotic medicines. These may be prescribed if your condition was caused by an infection. °HOME CARE INSTRUCTIONS °Eating and Drinking °· Drink enough fluids to keep your urine clear or pale yellow. °· If you cannot eat, alternate between drinking fluids with sugar (such as juice) and salty fluids (such as broth or bouillon). °· If you can eat, follow your usual diet and drink sugar-free liquids, such as water. °Other Instructions °· Take insulin as  directed by your health care provider. Do not skip insulin injections. Do not use expired insulin. °· If your blood sugar is over 240 mg/dL, monitor your urine ketones every 4-6 hours. °· If you were prescribed an antibiotic medicine, finish all of it even if you start to feel better. °· Rest and exercise only as directed by your health care provider. °· If you get sick, call your health care provider and begin treatment quickly. Your body often needs extra insulin to fight an illness. °· Check your blood glucose levels regularly. If your blood glucose is high, drink plenty of fluids. This helps to flush out ketones. °SEEK MEDICAL CARE IF: °· Your blood glucose level is too high or too low. °· You have ketones in your urine. °· You have a fever. °· You cannot eat. °· You cannot tolerate fluids. °· You have been vomiting for more than 2 hours. °· You continue to have symptoms of this condition. °· You develop new symptoms. °SEEK IMMEDIATE MEDICAL CARE IF: °· Your blood glucose levels continue to be high (elevated). °· Your monitor reads "high" even when you are taking insulin. °· You faint. °· You have chest pain. °· You have trouble breathing. °· You have a sudden, severe headache. °· You have sudden weakness in one arm or one leg. °· You have sudden trouble speaking or swallowing. °· You have vomiting or diarrhea that gets worse after 3 hours. °· You feel severely fatigued. °· You have trouble thinking. °· You have abdominal pain. °· You are severely dehydrated. Symptoms of severe dehydration include: °¨ Extreme thirst. °¨ Dry mouth. °¨ Blue lips. °¨   Cold hands and feet. °¨ Rapid breathing. °  °This information is not intended to replace advice given to you by your health care provider. Make sure you discuss any questions you have with your health care provider. °  °Document Released: 06/08/2000 Document Revised: 10/26/2014 Document Reviewed: 05/19/2014 °Elsevier Interactive Patient Education ©2016 [employer]  [employer] ° °

## 2015-05-20 NOTE — Progress Notes (Signed)
Discharge instructions given to pt, verbalized understanding. Left the unit in stable condition. 

## 2015-05-20 NOTE — Discharge Summary (Signed)
Physician Discharge Summary  Jodell Weitman TKW:[id number] DOB: September 02, 1965 DOA: 05/17/2015  PCP: Barbette Merino, MD  Admit date: 05/17/2015 Discharge date: 05/20/2015  Recommendations for Outpatient Follow-up:  1. Continue insulin regimen as per prior to this admission 2. Please follow-up with primary care physician and have referral to cardiology for evaluation coronary artery disease. You may require stress test for further evaluation.  Discharge Diagnoses:  Principal Problem:   Diabetic ketoacidosis without coma associated with type 1 diabetes mellitus (HCC) Active Problems:   High anion gap metabolic acidosis   Nausea & vomiting   Uncontrolled type 2 diabetes mellitus with diabetic neuropathy, with long-term current use of insulin (HCC)   Hypotension   Gastroparesis due to DM (HCC)   CKD (chronic kidney disease) stage 3, GFR 30-59 ml/min   Anemia of chronic kidney failure   Elevated troponin   Hypokalemia   Anxiety and depression   Pressure ulcer    Discharge Condition: stable   Diet recommendation: as tolerated   History of present illness:  49 y.o. Female with past medical history of type 1 diabetes on insulin, gastroparesis, neuropathy, anxiety and depression who presented to [facility] ED with diffuse abdominal pain, nausea and vomiting for few hours prior to this admission. No fevers.  In ED, she was initially hemodynamically stable. She was found to be in DKA and started on insulin drip. She was also found to be hypotensive couple of hours after admission so she required brief course of pressors but they were stopped 11/23 early in am.  Transferred to telemetry 05/19/2015.  Hospital Course:    Assessment/Plan:    Principal Problem:  Diabetic ketoacidosis without coma associated with type 1 diabetes mellitus ([place]) / High anion gap metabolic acidosis / Uncontrolled type 2 diabetes mellitus with diabetic neuropathy, with long-term current use of insulin (HCC) - DKA  criteria were met on admission with CBG 435, glucose on BMP 469, ketone in urine, AG of 24. - - Started on insulin drip per DKA protocol - Insulin drip stopped 11/23 - A1c is 7.3, slightly above goal range - Patient will continue insulin regimen as per prior to this admission, encourage compliance   Active Problems:  Nausea & vomiting - Likely due to DKA - Improved since DKA resolved - Patient has tolerated, modified diet.    Hypotension - Likely hypovolemia - No evidence of acute infectious process - No fevers and normal WBC count  - UA unremarkable - CXR showed no acute cardiopulmonary findings    Gastroparesis due to DM (HCC) - Stable   Hypokalemia - Likely secondary to insulin. She is on potassium supplementation at home.    CKD (chronic kidney disease) stage 3, GFR 30-59 ml/min - Baseline Cr 1.4 and on this admission within baseline values   Anemia of chronic kidney failure - Hemoglobin 8.3, stable    Elevated troponin - Likely demand ischemia from hypotension - Trop 0.48 --> 0.44 --> 0.47 - No acute ischemic changes on 12-lead EKG. - 2 D ECHO with normal EF nad no regional wall motion abnormalities  - I spoke with Dr. Tamala Julian of cardiology who recommended outpatient follow-up with cardiology and making sure that patient is evaluated for coronary artery disease.   Anxiety and depression - Continue prozac and ativan  - Stable  DVT Prophylaxis  - Lovenox subQ   Code Status: Full.  Family Communication: plan of care discussed with the patient   IV access:  Peripheral IV CVL  Procedures and diagnostic studies:  Dg Chest 2 View 05/17/2015 No active cardiopulmonary disease. Electronically Signed  By: Rolm Baptise M.D. On: 05/17/2015 11:50   Dg Chest Port 1 View 05/17/2015 1. Right IJ line noted ending overlying the right atrium. This could be retracted approximately 3 cm to the cavoatrial junction. 2. No acute cardiopulmonary process  seen. Electronically Signed By: Garald Balding M.D. On: 05/17/2015 21:45   Dg Abd 2 Views 05/17/2015 No demonstrable obstruction or free air. Diffuse stool throughout colon. Electronically Signed By: Lowella Grip III M.D. On: 05/17/2015 11:45    Medical Consultants:  PCCM  Other Consultants:  None   IAnti-Infectives:   None   Signed:  Leisa Lenz, MD  [facility] 05/20/2015, 9:06 AM  Pager #: [phone]  Time spent in minutes: more than 30 minutes  Discharge Exam: Filed Vitals:   05/19/15 2109 05/20/15 0526  BP: 138/69 149/68  Pulse: 88 79  Temp: 98.5 F (36.9 C) 98.3 F (36.8 C)  Resp: 22 22   Filed Vitals:   05/19/15 1351 05/19/15 1833 05/19/15 2109 05/20/15 0526  BP: 153/79 145/78 138/69 149/68  Pulse: 97 92 88 79  Temp: 97.9 F (36.6 C) 98.2 F (36.8 C) 98.5 F (36.9 C) 98.3 F (36.8 C)  TempSrc: Oral Oral Oral Oral  Resp: _0 Height:      Weight:      SpO2: 99% 95% 97% 99%    General: Pt is alert, follows commands appropriately, not in acute distress Cardiovascular: Regular rate and rhythm, S1/S2 + Respiratory: Clear to auscultation bilaterally, no wheezing, no crackles, no rhonchi Abdominal: Soft, non tender, non distended, bowel sounds +, no guarding Extremities: no edema, no cyanosis, pulses palpable bilaterally DP and PT Neuro: Grossly nonfocal  Discharge Instructions  Discharge Instructions    Call MD for:  difficulty breathing, headache or visual disturbances    Complete by:  As directed      Call MD for:  persistant nausea and vomiting    Complete by:  As directed      Call MD for:  severe uncontrolled pain    Complete by:  As directed      Diet - low sodium heart healthy    Complete by:  As directed      Discharge instructions    Complete by:  As directed   1. Continue insulin regimen as per prior to this admission 2. Please follow-up with primary care physician and have referral to cardiology  for evaluation coronary artery disease. You may require stress test for further evaluation.     Increase activity slowly    Complete by:  As directed             Medication List    STOP taking these medications        ciprofloxacin 500 MG tablet  Commonly known as:  CIPRO     LORazepam 1 MG tablet  Commonly known as:  ATIVAN     potassium chloride SA 20 MEQ tablet  Commonly known as:  K-DUR,KLOR-CON     prochlorperazine 25 MG suppository  Commonly known as:  COMPAZINE      TAKE these medications        ALPRAZolam 0.25 MG tablet  Commonly known as:  XANAX  Take 1 tablet (0.25 mg total) by mouth 3 (three) times daily as needed for anxiety.     dronabinol 10 MG capsule  Commonly known as:  MARINOL  Take 10 mg by mouth 2 (two)  times daily.     ferrous sulfate 325 (65 FE) MG tablet  Take 325 mg by mouth daily.     FLUoxetine 40 MG capsule  Commonly known as:  PROZAC  Take 40 mg by mouth daily.     furosemide 40 MG tablet  Commonly known as:  LASIX  Take 40 mg by mouth daily.     hydrOXYzine 50 MG tablet  Commonly known as:  ATARAX/VISTARIL  Take 50 mg by mouth every 4 (four) hours as needed for anxiety.     insulin aspart 100 UNIT/ML injection  Commonly known as:  novoLOG  Inject 0-15 Units into the skin 3 (three) times daily with meals. 6-15 units three times daily with food.  Per sliding scale     insulin detemir 100 UNIT/ML injection  Commonly known as:  LEVEMIR  Inject 0.15 mLs (15 Units total) into the skin at bedtime.     lisinopril 10 MG tablet  Commonly known as:  PRINIVIL,ZESTRIL  Take 10 mg by mouth daily.     ondansetron 4 MG disintegrating tablet  Commonly known as:  ZOFRAN ODT  Take 1 tablet (4 mg total) by mouth every 8 (eight) hours as needed for nausea.     pantoprazole 40 MG tablet  Commonly known as:  PROTONIX  Take 40 mg by mouth 2 (two) times daily.     potassium chloride 10 MEQ tablet  Commonly known as:  K-DUR  Take 1 tablet (10  mEq total) by mouth daily.     pregabalin 150 MG capsule  Commonly known as:  LYRICA  Take 1 capsule (150 mg total) by mouth 2 (two) times daily.     promethazine 25 MG tablet  Commonly known as:  PHENERGAN  Take 1 tablet (25 mg total) by mouth every 6 (six) hours as needed for nausea or vomiting.     promethazine 25 MG suppository  Commonly known as:  PHENERGAN  Place 1 suppository (25 mg total) rectally every 6 (six) hours as needed for nausea or vomiting.           Follow-up Information    Follow up with [place], MD. Schedule an appointment as soon as possible for a visit in 1 week.   Specialty:  Internal Medicine   Why:  Follow up appt after recent hospitalization   Contact information:   [address] [place] [address] [phone]        The results of significant diagnostics from this hospitalization (including imaging, microbiology, ancillary and laboratory) are listed below for reference.    Significant Diagnostic Studies: Dg Chest 2 View  05/17/2015  CLINICAL DATA:  Nausea, vomiting since last night.  DKA. EXAM: CHEST  2 VIEW COMPARISON:  05/14/2015 FINDINGS: The heart size and mediastinal contours are within normal limits. Both lungs are clear. The visualized skeletal structures are unremarkable. IMPRESSION: No active cardiopulmonary disease. Electronically Signed   By: Rolm Baptise M.D.   On: 05/17/2015 11:50   Dg Chest 2 View  05/14/2015  CLINICAL DATA:  N/V today; no chest complaints today, pleural effusion seen on previous Abdomen CT that was done this morning per Pa's note; hx asthma; non smoker; HTn; diabetic EXAM: CHEST  2 VIEW COMPARISON:  05/08/2015 FINDINGS: The heart size and mediastinal contours are within normal limits. Both lungs are clear. No pleural effusion or pneumothorax. The visualized skeletal structures are unremarkable. IMPRESSION: No active cardiopulmonary disease. Electronically Signed   By: Dedra Skeens.D.  On: 05/14/2015  09:49   Ct Abdomen Pelvis W Contrast  05/14/2015  CLINICAL DATA:  Abdominal pain and nausea and vomiting. Elevated lipase. EXAM: CT ABDOMEN AND PELVIS WITH CONTRAST TECHNIQUE: Multidetector CT imaging of the abdomen and pelvis was performed using the standard protocol following bolus administration of intravenous contrast. CONTRAST:  44m OMNIPAQUE IOHEXOL 300 MG/ML SOLN, 1029mOMNIPAQUE IOHEXOL 300 MG/ML SOLN COMPARISON:  Radiograph dated 05/08/2015 and CT scan dated 03/10/2015 FINDINGS: Lower chest: There is a new small right pleural effusion with slight atelectasis at the right lung base laterally and posteriorly. Small chronic hiatal hernia. Hepatobiliary: Cholecystectomy. Focal fatty infiltration in the left lobe adjacent to the falciform ligament. No biliary ductal dilatation. Pancreas: Normal. Spleen: Normal. Adrenals/Urinary Tract: Normal adrenal glands and kidneys. Slight prominence of the ureters. Chronic distention of the bladder to the level of the umbilicus. The bladder has been abnormally distended dating back to 03/18/2010. Stomach/Bowel: Small hiatal hernia. Extensive stool in the nondistended colon. The sigmoid portion of the colon is compressed by the distended urinary bladder. There is stool in the rectum. Vascular/Lymphatic: Minimal calcification in the abdominal aorta. No adenopathy. Reproductive: 12 mm cyst on the left ovary. Small calcified fibroid in the left side of the uterine fundus. Other: No free air or free fluid. Musculoskeletal: Normal. IMPRESSION: 1. New small right pleural effusion with slight atelectasis at the right lung base. 2. Marked chronic distention of the urinary bladder with secondary slight dilatation of the ureters. The sigmoid portion of the colon is also compressed by the distended urinary bladder. 3. Normal appearing pancreas. Electronically Signed   By: JaLorriane Shire.D.   On: 05/14/2015 09:12   Dg Chest Port 1 View  05/17/2015  CLINICAL DATA:  Right IJ  line placement.  Initial encounter. EXAM: PORTABLE CHEST 1 VIEW COMPARISON:  Chest radiograph performed earlier today at 11:23 a.m. FINDINGS: The patient's right IJ line is noted ending overlying the right atrium. This could be retracted approximately 3 cm to the cavoatrial junction. The lungs are relatively well expanded and appear grossly clear. No focal consolidation, pleural effusion or pneumothorax is seen. The cardiomediastinal silhouette is normal in size. No acute osseous abnormalities are identified. Clips are noted within the right upper quadrant, reflecting prior cholecystectomy. IMPRESSION: 1. Right IJ line noted ending overlying the right atrium. This could be retracted approximately 3 cm to the cavoatrial junction. 2. No acute cardiopulmonary process seen. Electronically Signed   By: JeGarald Balding.D.   On: 05/17/2015 21:45   Dg Abd 2 Views  05/17/2015  CLINICAL DATA:  Nausea and vomiting with hyperglycemia EXAM: ABDOMEN - 2 VIEW COMPARISON:  CT abdomen and pelvis May 14, 2015 FINDINGS: Supine and left lateral decubitus images were obtained. There is diffuse stool throughout the colon. No bowel dilatation or air-fluid level suggesting obstruction. No free air. There are surgical clips the right upper quadrant. Visualized lung bases are clear. No abnormal calcifications. IMPRESSION: No demonstrable obstruction or free air. Diffuse stool throughout colon. Electronically Signed   By: WiLowella GripII M.D.   On: 05/17/2015 11:45    Microbiology: Recent Results (from the past 240 hour(s))  Urine culture     Status: None   Collection Time: 05/17/15 11:18 AM  Result Value Ref Range Status   Specimen Description URINE, CLEAN CATCH  Final   Special Requests NONE  Final   Culture   Final    MULTIPLE SPECIES PRESENT, SUGGEST RECOLLECTION [facility]  Report Status 05/18/2015 FINAL  Final  MRSA PCR Screening     Status: None   Collection Time: 05/17/15 12:23  PM  Result Value Ref Range Status   MRSA by PCR NEGATIVE NEGATIVE Final    Comment:        The GeneXpert MRSA Assay (FDA approved for NASAL specimens only), is one component of a comprehensive MRSA colonization surveillance program. It is not intended to diagnose MRSA infection nor to guide or monitor treatment for MRSA infections.      Labs: Basic Metabolic Panel:  Recent Labs Lab 05/17/15 1133  05/17/15 2342 05/18/15 0330 05/18/15 0600 05/18/15 1005 05/19/15 0515  NA 137  < > 141 140 140 140 132*  K 4.4  < > 3.6 3.7 3.5 3.1* 3.4*  CL 106  < > 118* 115* 116* 115* 109  CO2 12*  < > 16* 16* 19* 20* 19*  GLUCOSE 469*  < > 114* 216* 224* 93 64*  BUN 16  < > _0 CREATININE 1.24*  < > 1.18* 1.08* 0.98 0.96 0.95  CALCIUM 8.5*  < > 7.8* 8.0* 7.8* 8.1* 7.7*  MG 1.6*  --   --  2.2  --   --   --   PHOS 4.1  --   --   --   --   --   --   < > = values in this interval not displayed. Liver Function Tests:  Recent Labs Lab 05/14/15 6568 05/17/15 0826  AST 25 22  ALT 43 31  ALKPHOS 103 105  BILITOT 1.0 2.0*  PROT 7.9 7.7  ALBUMIN 3.9 4.1    Recent Labs Lab 05/14/15 0633 05/17/15 0826  LIPASE 61* 28   No results for input(s): AMMONIA in the last 168 hours. CBC:  Recent Labs Lab 05/14/15 0633 05/17/15 0826 05/18/15 0330  WBC 4.7 10.5 10.1  NEUTROABS 3.3  --   --   HGB 11.0* 10.1* 8.3*  HCT 33.7* 31.6* 25.9*  MCV 86.0 86.1 88.4  PLT 494* 432* 363   Cardiac Enzymes:  Recent Labs Lab 05/17/15 2342 05/18/15 0330 05/18/15 1715 05/18/15 2320 05/19/15 0515  TROPONINI 0.30* 0.43* 0.48* 0.44* 0.47*   BNP: BNP (last 3 results) No results for input(s): BNP in the last 8760 hours.  ProBNP (last 3 results) No results for input(s): PROBNP in the last 8760 hours.  CBG:  Recent Labs Lab 05/19/15 0810 05/19/15 1144 05/19/15 1733 05/19/15 2147 05/20/15 0755  GLUCAP 72 196* 97 221* 249*

## 2015-05-20 NOTE — Care Management Note (Signed)
Case Management Note  Patient Details  Name: [name] MRN: [id number] Date of Birth: 14-Mar-1966  Subjective/Objective:    49 y/o f admitted w/DKA. From home. PT-recc HHPT. HHPT ordered.[facility] chosen.[facility] rep-aware of referral, d/c & HHPT order.[facility] was a prior SOC from prior but they never made home visit.                Action/Plan:d/c home w/HHPT.   Expected Discharge Date:   (UNKNOWN)               Expected Discharge Plan:  [place]  In-House Referral:  NA  Discharge planning Services  CM Consult  Post Acute Care Choice:  NA Choice offered to:  NA, Patient  DME Arranged:    DME Agency:     HH Arranged:  PT [place]:  [facility]  Status of Service:  Completed, signed off  Medicare Important Message Given:    Date Medicare IM Given:    Medicare IM give by:    Date Additional Medicare IM Given:    Additional Medicare Important Message give by:     If discussed at [place] of Stay Meetings, dates discussed:    Additional Comments:  Dessa Phi, RN 05/20/2015, 12:11 PM

## 2015-05-23 ENCOUNTER — Inpatient Hospital Stay (HOSPITAL_COMMUNITY)
Admission: EM | Admit: 2015-05-23 | Discharge: 2015-05-24 | DRG: 638 | Disposition: A | Discharge status: Home / Self Care | Payer: 59 | Attending: Internal Medicine | Admitting: Internal Medicine

## 2015-05-23 ENCOUNTER — Inpatient Hospital Stay (HOSPITAL_COMMUNITY): Payer: 59

## 2015-05-23 ENCOUNTER — Encounter (HOSPITAL_COMMUNITY): Payer: Self-pay | Admitting: Emergency Medicine

## 2015-05-23 DIAGNOSIS — E1010 Type 1 diabetes mellitus with ketoacidosis without coma: Secondary | ICD-10-CM | POA: Diagnosis not present

## 2015-05-23 DIAGNOSIS — E1043 Type 1 diabetes mellitus with diabetic autonomic (poly)neuropathy: Secondary | ICD-10-CM | POA: Diagnosis present

## 2015-05-23 DIAGNOSIS — R109 Unspecified abdominal pain: Secondary | ICD-10-CM

## 2015-05-23 DIAGNOSIS — Z8249 Family history of ischemic heart disease and other diseases of the circulatory system: Secondary | ICD-10-CM

## 2015-05-23 DIAGNOSIS — E8729 Other acidosis: Secondary | ICD-10-CM | POA: Diagnosis present

## 2015-05-23 DIAGNOSIS — F329 Major depressive disorder, single episode, unspecified: Secondary | ICD-10-CM | POA: Diagnosis present

## 2015-05-23 DIAGNOSIS — E1040 Type 1 diabetes mellitus with diabetic neuropathy, unspecified: Secondary | ICD-10-CM

## 2015-05-23 DIAGNOSIS — E860 Dehydration: Secondary | ICD-10-CM

## 2015-05-23 DIAGNOSIS — G629 Polyneuropathy, unspecified: Secondary | ICD-10-CM | POA: Diagnosis present

## 2015-05-23 DIAGNOSIS — R1110 Vomiting, unspecified: Secondary | ICD-10-CM

## 2015-05-23 DIAGNOSIS — E871 Hypo-osmolality and hyponatremia: Secondary | ICD-10-CM

## 2015-05-23 DIAGNOSIS — D649 Anemia, unspecified: Secondary | ICD-10-CM | POA: Diagnosis present

## 2015-05-23 DIAGNOSIS — E876 Hypokalemia: Secondary | ICD-10-CM | POA: Diagnosis present

## 2015-05-23 DIAGNOSIS — Z833 Family history of diabetes mellitus: Secondary | ICD-10-CM

## 2015-05-23 DIAGNOSIS — K3184 Gastroparesis: Secondary | ICD-10-CM

## 2015-05-23 DIAGNOSIS — F419 Anxiety disorder, unspecified: Secondary | ICD-10-CM

## 2015-05-23 DIAGNOSIS — E1110 Type 2 diabetes mellitus with ketoacidosis without coma: Secondary | ICD-10-CM

## 2015-05-23 DIAGNOSIS — D72829 Elevated white blood cell count, unspecified: Secondary | ICD-10-CM

## 2015-05-23 DIAGNOSIS — Z9049 Acquired absence of other specified parts of digestive tract: Secondary | ICD-10-CM

## 2015-05-23 DIAGNOSIS — I10 Essential (primary) hypertension: Secondary | ICD-10-CM

## 2015-05-23 DIAGNOSIS — E1143 Type 2 diabetes mellitus with diabetic autonomic (poly)neuropathy: Secondary | ICD-10-CM

## 2015-05-23 DIAGNOSIS — F32A Depression, unspecified: Secondary | ICD-10-CM | POA: Diagnosis present

## 2015-05-23 DIAGNOSIS — Z794 Long term (current) use of insulin: Secondary | ICD-10-CM

## 2015-05-23 DIAGNOSIS — Z79899 Other long term (current) drug therapy: Secondary | ICD-10-CM

## 2015-05-23 DIAGNOSIS — E875 Hyperkalemia: Secondary | ICD-10-CM

## 2015-05-23 DIAGNOSIS — E872 Acidosis: Secondary | ICD-10-CM | POA: Diagnosis present

## 2015-05-23 DIAGNOSIS — E1310 Other specified diabetes mellitus with ketoacidosis without coma: Secondary | ICD-10-CM

## 2015-05-23 HISTORY — DX: Type 2 diabetes mellitus without complications: E11.9

## 2015-05-23 LAB — DIFFERENTIAL
BASOS PCT: 0 %
Basophils Absolute: 0 10*3/uL (ref 0.0–0.1)
Eosinophils Absolute: 0 10*3/uL (ref 0.0–0.7)
Eosinophils Relative: 0 %
LYMPHS ABS: 0.8 10*3/uL (ref 0.7–4.0)
LYMPHS PCT: 6 %
MONO ABS: 0.5 10*3/uL (ref 0.1–1.0)
MONOS PCT: 3 %
NEUTROS ABS: 13.3 10*3/uL — AB (ref 1.7–7.7)
Neutrophils Relative %: 91 %

## 2015-05-23 LAB — URINE MICROSCOPIC-ADD ON: RBC / HPF: NONE SEEN RBC/hpf (ref 0–5)

## 2015-05-23 LAB — BLOOD GAS, VENOUS
ACID-BASE DEFICIT: 8.6 mmol/L — AB (ref 0.0–2.0)
Bicarbonate: 16.8 mEq/L — ABNORMAL LOW (ref 20.0–24.0)
FIO2: 0.21
O2 SAT: 78.4 %
PATIENT TEMPERATURE: 98.6
TCO2: 16 mmol/L (ref 0–100)
pCO2, Ven: 35.9 mmHg — ABNORMAL LOW (ref 45.0–50.0)
pH, Ven: 7.291 (ref 7.250–7.300)
pO2, Ven: 52.1 mmHg — ABNORMAL HIGH (ref 30.0–45.0)

## 2015-05-23 LAB — HEPATIC FUNCTION PANEL
ALK PHOS: 92 U/L (ref 38–126)
ALT: 21 U/L (ref 14–54)
AST: 24 U/L (ref 15–41)
Albumin: 4 g/dL (ref 3.5–5.0)
BILIRUBIN INDIRECT: 1.4 mg/dL — AB (ref 0.3–0.9)
Bilirubin, Direct: 0.2 mg/dL (ref 0.1–0.5)
TOTAL PROTEIN: 7.9 g/dL (ref 6.5–8.1)
Total Bilirubin: 1.6 mg/dL — ABNORMAL HIGH (ref 0.3–1.2)

## 2015-05-23 LAB — BASIC METABOLIC PANEL
Anion gap: 25 — ABNORMAL HIGH (ref 5–15)
Anion gap: 14 (ref 5–15)
Anion gap: 3 — ABNORMAL LOW (ref 5–15)
BUN: 14 mg/dL (ref 6–20)
BUN: 12 mg/dL (ref 6–20)
BUN: 12 mg/dL (ref 6–20)
CHLORIDE: 93 mmol/L — AB (ref 101–111)
CHLORIDE: 104 mmol/L (ref 101–111)
CHLORIDE: 93 mmol/L — AB (ref 101–111)
CO2: 16 mmol/L — AB (ref 22–32)
CO2: 20 mmol/L — ABNORMAL LOW (ref 22–32)
CO2: 33 mmol/L — ABNORMAL HIGH (ref 22–32)
CREATININE: 0.93 mg/dL (ref 0.44–1.00)
CREATININE: 0.37 mg/dL — AB (ref 0.44–1.00)
Calcium: 9.4 mg/dL (ref 8.9–10.3)
Calcium: 8.5 mg/dL — ABNORMAL LOW (ref 8.9–10.3)
Calcium: 8.7 mg/dL — ABNORMAL LOW (ref 8.9–10.3)
Creatinine, Ser: 1.06 mg/dL — ABNORMAL HIGH (ref 0.44–1.00)
GFR calc Af Amer: >60 mL/min (ref >60)
GFR calc Af Amer: >60 mL/min (ref >60)
GFR calc non Af Amer: >60 mL/min (ref >60)
GFR calc non Af Amer: >60 mL/min (ref >60)
GLUCOSE: 494 mg/dL — AB (ref 65–99)
GLUCOSE: 139 mg/dL — AB (ref 65–99)
Glucose, Bld: 170 mg/dL — ABNORMAL HIGH (ref 65–99)
POTASSIUM: 3.3 mmol/L — AB (ref 3.5–5.1)
POTASSIUM: 3.7 mmol/L (ref 3.5–5.1)
Potassium: 4.3 mmol/L (ref 3.5–5.1)
SODIUM: 138 mmol/L (ref 135–145)
SODIUM: 129 mmol/L — AB (ref 135–145)
Sodium: 134 mmol/L — ABNORMAL LOW (ref 135–145)

## 2015-05-23 LAB — URINALYSIS, ROUTINE W REFLEX MICROSCOPIC
BILIRUBIN URINE: NEGATIVE
LEUKOCYTES UA: NEGATIVE
Nitrite: NEGATIVE
PH: 6 (ref 5.0–8.0)
Protein, ur: 30 mg/dL — AB
Specific Gravity, Urine: 1.021 (ref 1.005–1.030)

## 2015-05-23 LAB — CBC
HEMATOCRIT: 32 % — AB (ref 36.0–46.0)
Hemoglobin: 10.6 g/dL — ABNORMAL LOW (ref 12.0–15.0)
MCH: 28.1 pg (ref 26.0–34.0)
MCHC: 33.1 g/dL (ref 30.0–36.0)
MCV: 84.9 fL (ref 78.0–100.0)
Platelets: 387 10*3/uL (ref 150–400)
RBC: 3.77 MIL/uL — ABNORMAL LOW (ref 3.87–5.11)
RDW: 15.4 % (ref 11.5–15.5)
WBC: 14.7 10*3/uL — ABNORMAL HIGH (ref 4.0–10.5)

## 2015-05-23 LAB — CBG MONITORING, ED
GLUCOSE-CAPILLARY: 468 mg/dL — AB (ref 65–99)
Glucose-Capillary: 452 mg/dL — ABNORMAL HIGH (ref 65–99)

## 2015-05-23 LAB — GLUCOSE, CAPILLARY
GLUCOSE-CAPILLARY: 325 mg/dL — AB (ref 65–99)
GLUCOSE-CAPILLARY: 136 mg/dL — AB (ref 65–99)
GLUCOSE-CAPILLARY: 174 mg/dL — AB (ref 65–99)
GLUCOSE-CAPILLARY: 116 mg/dL — AB (ref 65–99)
Glucose-Capillary: 260 mg/dL — ABNORMAL HIGH (ref 65–99)
Glucose-Capillary: 202 mg/dL — ABNORMAL HIGH (ref 65–99)
Glucose-Capillary: 112 mg/dL — ABNORMAL HIGH (ref 65–99)
Glucose-Capillary: 123 mg/dL — ABNORMAL HIGH (ref 65–99)
Glucose-Capillary: 166 mg/dL — ABNORMAL HIGH (ref 65–99)
Glucose-Capillary: 201 mg/dL — ABNORMAL HIGH (ref 65–99)
Glucose-Capillary: 173 mg/dL — ABNORMAL HIGH (ref 65–99)
Glucose-Capillary: 132 mg/dL — ABNORMAL HIGH (ref 65–99)

## 2015-05-23 LAB — RAPID URINE DRUG SCREEN, HOSP PERFORMED
Amphetamines: NOT DETECTED
BENZODIAZEPINES: NOT DETECTED
Barbiturates: NOT DETECTED
COCAINE: NOT DETECTED
OPIATES: NOT DETECTED
TETRAHYDROCANNABINOL: POSITIVE — AB

## 2015-05-23 LAB — PHOSPHORUS: PHOSPHORUS: 2.6 mg/dL (ref 2.5–4.6)

## 2015-05-23 LAB — MRSA PCR SCREENING: MRSA by PCR: NEGATIVE

## 2015-05-23 LAB — MAGNESIUM: MAGNESIUM: 1.4 mg/dL — AB (ref 1.7–2.4)

## 2015-05-23 LAB — LIPASE, BLOOD: Lipase: 43 U/L (ref 11–51)

## 2015-05-23 MED ORDER — LORAZEPAM 2 MG/ML IJ SOLN
1.0000 mg | Freq: Once | INTRAMUSCULAR | Status: AC
  Administered 2015-05-23: 1 mg via INTRAVENOUS
  Filled 2015-05-23: qty 1

## 2015-05-23 MED ORDER — INSULIN DETEMIR 100 UNIT/ML ~~LOC~~ SOLN
15.0000 [IU] | Freq: Every day | SUBCUTANEOUS | Status: DC
  Filled 2015-05-23: qty 0.15

## 2015-05-23 MED ORDER — SODIUM CHLORIDE 0.9 % IV BOLUS (SEPSIS)
1000.0000 mL | Freq: Once | INTRAVENOUS | Status: AC
  Administered 2015-05-23: 1000 mL via INTRAVENOUS

## 2015-05-23 MED ORDER — PANTOPRAZOLE SODIUM 40 MG PO TBEC
40.0000 mg | DELAYED_RELEASE_TABLET | Freq: Every day | ORAL | Status: DC
  Administered 2015-05-23 – 2015-05-24 (×2): 40 mg via ORAL
  Filled 2015-05-23 (×2): qty 1

## 2015-05-23 MED ORDER — PREGABALIN 75 MG PO CAPS
150.0000 mg | ORAL_CAPSULE | Freq: Two times a day (BID) | ORAL | Status: DC
  Administered 2015-05-23 – 2015-05-24 (×3): 150 mg via ORAL
  Filled 2015-05-23 (×3): qty 2

## 2015-05-23 MED ORDER — ONDANSETRON HCL 4 MG PO TABS: 4.0000 mg | ORAL_TABLET | Freq: Four times a day (QID) | ORAL | Status: DC | PRN

## 2015-05-23 MED ORDER — LORAZEPAM 2 MG/ML IJ SOLN
0.5000 mg | Freq: Four times a day (QID) | INTRAMUSCULAR | Status: DC | PRN
  Administered 2015-05-23 (×2): 0.5 mg via INTRAVENOUS
  Filled 2015-05-23 (×2): qty 1

## 2015-05-23 MED ORDER — ONDANSETRON HCL 4 MG/2ML IJ SOLN
4.0000 mg | Freq: Once | INTRAMUSCULAR | Status: AC
  Administered 2015-05-23: 4 mg via INTRAVENOUS
  Filled 2015-05-23: qty 2

## 2015-05-23 MED ORDER — FLUOXETINE HCL 20 MG PO CAPS
40.0000 mg | ORAL_CAPSULE | Freq: Every day | ORAL | Status: DC
  Administered 2015-05-23 – 2015-05-24 (×2): 40 mg via ORAL
  Filled 2015-05-23 (×2): qty 2

## 2015-05-23 MED ORDER — SODIUM CHLORIDE 0.9 % IV SOLN
INTRAVENOUS | Status: DC
  Filled 2015-05-23: qty 2.5

## 2015-05-23 MED ORDER — POTASSIUM CHLORIDE ER 10 MEQ PO TBCR
10.0000 meq | EXTENDED_RELEASE_TABLET | Freq: Every day | ORAL | Status: DC
  Filled 2015-05-23: qty 1

## 2015-05-23 MED ORDER — LISINOPRIL 10 MG PO TABS
10.0000 mg | ORAL_TABLET | Freq: Every day | ORAL | Status: DC
  Administered 2015-05-23 – 2015-05-24 (×2): 10 mg via ORAL
  Filled 2015-05-23 (×2): qty 1

## 2015-05-23 MED ORDER — LIDOCAINE HCL (PF) 1 % IJ SOLN
INTRAMUSCULAR | Status: AC
  Filled 2015-05-23: qty 30

## 2015-05-23 MED ORDER — SODIUM CHLORIDE 0.9 % IV SOLN: INTRAVENOUS | Status: AC

## 2015-05-23 MED ORDER — SODIUM CHLORIDE 0.9 % IV SOLN
INTRAVENOUS | Status: DC
  Administered 2015-05-23: 4.1 [IU]/h via INTRAVENOUS
  Filled 2015-05-23: qty 2.5

## 2015-05-23 MED ORDER — DEXTROSE 50 % IV SOLN
25.0000 mL | INTRAVENOUS | Status: DC | PRN
  Administered 2015-05-24: 25 mL via INTRAVENOUS
  Filled 2015-05-23: qty 50

## 2015-05-23 MED ORDER — SODIUM CHLORIDE 0.9 % IJ SOLN
3.0000 mL | Freq: Two times a day (BID) | INTRAMUSCULAR | Status: DC
  Administered 2015-05-23: 3 mL via INTRAVENOUS
  Administered 2015-05-24: 10 mL via INTRAVENOUS

## 2015-05-23 MED ORDER — SODIUM CHLORIDE 0.9 % IV SOLN
1000.0000 mL | INTRAVENOUS | Status: DC
  Administered 2015-05-23: 1000 mL via INTRAVENOUS

## 2015-05-23 MED ORDER — INSULIN REGULAR BOLUS VIA INFUSION
0.0000 [IU] | Freq: Three times a day (TID) | INTRAVENOUS | Status: DC
  Filled 2015-05-23: qty 10

## 2015-05-23 MED ORDER — INSULIN ASPART 100 UNIT/ML ~~LOC~~ SOLN
0.0000 [IU] | Freq: Three times a day (TID) | SUBCUTANEOUS | Status: DC
  Administered 2015-05-23: 5 [IU] via SUBCUTANEOUS
  Administered 2015-05-24 (×2): 2 [IU] via SUBCUTANEOUS

## 2015-05-23 MED ORDER — LIDOCAINE HCL 1 % IJ SOLN
INTRAMUSCULAR | Status: AC
  Filled 2015-05-23: qty 20

## 2015-05-23 MED ORDER — INSULIN DETEMIR 100 UNIT/ML ~~LOC~~ SOLN
15.0000 [IU] | Freq: Every day | SUBCUTANEOUS | Status: DC
  Administered 2015-05-23: 15 [IU] via SUBCUTANEOUS
  Filled 2015-05-23 (×2): qty 0.15

## 2015-05-23 MED ORDER — SODIUM CHLORIDE 0.9 % IV SOLN
1000.0000 mL | Freq: Once | INTRAVENOUS | Status: AC
  Administered 2015-05-23: 1000 mL via INTRAVENOUS

## 2015-05-23 MED ORDER — MORPHINE SULFATE (PF) 2 MG/ML IV SOLN: 1.0000 mg | INTRAVENOUS | Status: DC | PRN

## 2015-05-23 MED ORDER — POTASSIUM CHLORIDE 10 MEQ/100ML IV SOLN
10.0000 meq | Freq: Once | INTRAVENOUS | Status: AC
  Administered 2015-05-23: 10 meq via INTRAVENOUS
  Filled 2015-05-23: qty 100

## 2015-05-23 MED ORDER — SODIUM CHLORIDE 0.9 % IV SOLN: INTRAVENOUS | Status: DC

## 2015-05-23 MED ORDER — DEXTROSE-NACL 5-0.45 % IV SOLN: INTRAVENOUS | Status: DC

## 2015-05-23 MED ORDER — PROCHLORPERAZINE EDISYLATE 5 MG/ML IJ SOLN
10.0000 mg | Freq: Four times a day (QID) | INTRAMUSCULAR | Status: DC | PRN
  Administered 2015-05-23: 10 mg via INTRAVENOUS
  Filled 2015-05-23: qty 2

## 2015-05-23 MED ORDER — ONDANSETRON HCL 4 MG/2ML IJ SOLN: 4.0000 mg | Freq: Four times a day (QID) | INTRAMUSCULAR | Status: DC | PRN

## 2015-05-23 MED ORDER — ALPRAZOLAM 0.25 MG PO TABS: 0.2500 mg | ORAL_TABLET | Freq: Every evening | ORAL | Status: DC | PRN

## 2015-05-23 MED ORDER — POTASSIUM CHLORIDE 10 MEQ/100ML IV SOLN
10.0000 meq | INTRAVENOUS | Status: AC
  Administered 2015-05-23 (×4): 10 meq via INTRAVENOUS
  Filled 2015-05-23 (×2): qty 100

## 2015-05-23 MED ORDER — ENOXAPARIN SODIUM 40 MG/0.4ML ~~LOC~~ SOLN
40.0000 mg | SUBCUTANEOUS | Status: DC
  Administered 2015-05-23 – 2015-05-24 (×2): 40 mg via SUBCUTANEOUS
  Filled 2015-05-23 (×2): qty 0.4

## 2015-05-23 MED ORDER — DEXTROSE-NACL 5-0.45 % IV SOLN
INTRAVENOUS | Status: DC
  Administered 2015-05-23: 12:00:00 via INTRAVENOUS

## 2015-05-23 MED ORDER — FERROUS SULFATE 325 (65 FE) MG PO TABS
325.0000 mg | ORAL_TABLET | Freq: Every day | ORAL | Status: DC
  Administered 2015-05-23 – 2015-05-24 (×2): 325 mg via ORAL
  Filled 2015-05-23 (×2): qty 1

## 2015-05-23 MED ORDER — HYDROXYZINE HCL 25 MG PO TABS: 50.0000 mg | ORAL_TABLET | ORAL | Status: DC | PRN

## 2015-05-23 MED ORDER — DRONABINOL 5 MG PO CAPS
10.0000 mg | ORAL_CAPSULE | Freq: Two times a day (BID) | ORAL | Status: DC
  Administered 2015-05-23 – 2015-05-24 (×3): 10 mg via ORAL
  Filled 2015-05-23: qty 4
  Filled 2015-05-23: qty 2
  Filled 2015-05-23: qty 4

## 2015-05-23 MED ORDER — ONDANSETRON 4 MG PO TBDP
4.0000 mg | ORAL_TABLET | Freq: Three times a day (TID) | ORAL | Status: DC | PRN
Start: 2015-05-23 — End: 2015-05-23

## 2015-05-23 NOTE — ED Notes (Signed)
Per EMS- pt found vomiting in her home by son today. Hx DM, gastroparesis. Hasn't been able to keep PO Zofran down at home. HR 116 BP 147/97 Given 4 mg Zofran IM.

## 2015-05-23 NOTE — ED Notes (Signed)
Bed: [id number] Expected date:  Expected time:  Means of arrival:  Comments: EMS gastroparesis / hyperglycemia

## 2015-05-23 NOTE — Procedures (Signed)
R arm PowerPICC placed under US and fluoroscopy No ptx on spot chest radiograph. No complication No blood loss. See complete dictation in [facility] PACS.  

## 2015-05-23 NOTE — Progress Notes (Signed)
[facility] PROGRESS NOTE  [name] [id number] DOB: 1965-10-30 DOA: 05/23/2015 PCP: Barbette Merino, MD  HPI/Brief narrative 49 year old female with a past medical history of hypertension, anxiety, and type 1 complicated with gastroparesis and neuropathy; who is just hospitalized from 11/22 to 11/25 for symptoms of nausea vomiting abdominal pain found to be in DKA. Patient again presents today with similar symptoms stating she was doing fine until she had episodes of nausea and vomiting. She couldn't keep anything on her stomach and did not continued keeping up with her insulin regimen. Along the same time reports having significant abdominal pain and notes associated symptoms of a little bit of constipation. Patient reports non-bloody emesis. Denies chest pain, shortness of breath, loss of consciousness, or falls. Patient notes that she sees a gastroenterologist at [facility] for gastroparesis, and he had started her on Marinol  Assessment/Plan: DKA in a type I diabetic. Recurrent. Patient just admitted 11/22 through 11/25 with same symptoms. Patient with nausea vomiting of diffuse abdominal pain that she states is consistent gastroparesis. Urinalysis is nitrite negative, LE negative, and positive for ketones. Patient with a presenting Anion Gap of 25. With a bicarbonate level of 16 and a glucose of 494.  -check magnesium and phosphorus - IV fluids, glucose stabilizer -Diabetes educator -Care management consulted -Anion gap has closed. Will transition back to subQ insulin  Nausea/vomiting/abdominal pain: DDx secondary to patient's gastroparesis versus the possibility of withdrawls vs constipation.  -IV Zofran and Compazine -UDS positive for marijuana - cannibis hyperemesis syndrome? -abd xray with nonobstructive bowel gas pattern  Leukocytosis  -Unclear etiology -cont to monitor  Hypertension  -currently uncontrolled.  -Continue patient's medications of lisinopril  when to tolerate by PO reliably  Anemia: Chronic. Check an anemia panel. Follow H&H. -follow-up CBC and monitor  Dehydration: acute secondary to nausea vomiting -IV fluids  Pseudo-Hyponatremia-sodium level corrects within normal limits when adjusted for hyperglycemia  Anxiety -Continue fluoxetine when able -IV Ativan until able to take by mouth Xanax per home dose; then discontinue IV  Hypokalemia-initially K- 3.3 -Replaced  -Repeat BMP to follow replace as needed  DVT prophylaxis Lovenox  Code Status: Full Family Communication: Pt in room Disposition Plan: Home when DKA resolves and pt tolerates PO   Consultants:    Procedures:    Antibiotics: Anti-infectives    None      HPI/Subjective: Complains of abd pain  Objective: Filed Vitals:   05/23/15 1200 05/23/15 1400 05/23/15 1500 05/23/15 1600  BP: 142/50 131/61  106/38  Pulse: 108 110 111 109  Temp: 98.7 F (37.1 C)   98.1 F (36.7 C)  TempSrc: Oral   Oral  Resp: 21 19 29 23   SpO2: 100% 100% 100% 99%    Intake/Output Summary (Last 24 hours) at 05/23/15 1710 Last data filed at 05/23/15 1439  Gross per 24 hour  Intake 3258.37 ml  Output    200 ml  Net 3058.37 ml   There were no vitals filed for this visit.  Exam:   General:  Awake, in nad  Cardiovascular: regular, s1, s2  Respiratory: normal resp effort, no wheezing  Abdomen: soft, pos BS  Musculoskeletal: perfused, no clubbing   Data Reviewed: Basic Metabolic Panel:  Recent Labs Lab 05/17/15 1133  05/18/15 0330  05/18/15 1005 05/19/15 0515 05/23/15 0449 05/23/15 1450 05/23/15 1611  NA 137  < > 140  < > 140 132* 134* 138 129*  K 4.4  < > 3.7  < > 3.1* 3.4* 3.3* 3.7 4.3  CL 106  < > 115*  < > 115* 109 93* 104 93*  CO2 12*  < > 16*  < > 20* 19* 16* 20* 33*  GLUCOSE 469*  < > 216*  < > 93 64* 494* 170* 139*  BUN 16  < > 11  < > 10 6 14 12 12   CREATININE 1.24*  < > 1.08*  < > 0.96 0.95 1.06* 0.93 0.37*  CALCIUM 8.5*  < > 8.0*   < > 8.1* 7.7* 9.4 8.5* 8.7*  MG 1.6*  --  2.2  --   --   --   --  1.4*  --   PHOS 4.1  --   --   --   --   --   --  2.6  --   < > = values in this interval not displayed. Liver Function Tests:  Recent Labs Lab 05/17/15 0826 05/23/15 0449  AST 22 24  ALT 31 21  ALKPHOS 105 92  BILITOT 2.0* 1.6*  PROT 7.7 7.9  ALBUMIN 4.1 4.0    Recent Labs Lab 05/17/15 0826 05/23/15 0449  LIPASE 28 43   No results for input(s): AMMONIA in the last 168 hours. CBC:  Recent Labs Lab 05/17/15 0826 05/18/15 0330 05/23/15 0449  WBC 10.5 10.1 14.7*  NEUTROABS  --   --  13.3*  HGB 10.1* 8.3* 10.6*  HCT 31.6* 25.9* 32.0*  MCV 86.1 88.4 84.9  PLT 432* 363 387   Cardiac Enzymes:  Recent Labs Lab 05/17/15 2342 05/18/15 0330 05/18/15 1715 05/18/15 2320 05/19/15 0515  TROPONINI 0.30* 0.43* 0.48* 0.44* 0.47*   BNP (last 3 results) No results for input(s): BNP in the last 8760 hours.  ProBNP (last 3 results) No results for input(s): PROBNP in the last 8760 hours.  CBG:  Recent Labs Lab 05/23/15 1050 05/23/15 1205 05/23/15 1330 05/23/15 1456 05/23/15 1608  GLUCAP 136* 112* 123* 166* 174*    Recent Results (from the past 240 hour(s))  Urine culture     Status: None   Collection Time: 05/17/15 11:18 AM  Result Value Ref Range Status   Specimen Description URINE, CLEAN CATCH  Final   Special Requests NONE  Final   Culture   Final    MULTIPLE SPECIES PRESENT, SUGGEST RECOLLECTION Performed at [facility]    Report Status 05/18/2015 FINAL  Final  MRSA PCR Screening     Status: None   Collection Time: 05/17/15 12:23 PM  Result Value Ref Range Status   MRSA by PCR NEGATIVE NEGATIVE Final    Comment:        The GeneXpert MRSA Assay (FDA approved for NASAL specimens only), is one component of a comprehensive MRSA colonization surveillance program. It is not intended to diagnose MRSA infection nor to guide or monitor treatment for MRSA infections.   MRSA PCR  Screening     Status: None   Collection Time: 05/23/15  7:35 AM  Result Value Ref Range Status   MRSA by PCR NEGATIVE NEGATIVE Final    Comment:        The GeneXpert MRSA Assay (FDA approved for NASAL specimens only), is one component of a comprehensive MRSA colonization surveillance program. It is not intended to diagnose MRSA infection nor to guide or monitor treatment for MRSA infections.      Studies: Ir Fluoro Guide Cv Line Right  05/23/2015  CLINICAL DATA:  Diabetic ketoacidosis, needs venous access for IV therapies EXAM: PICC PLACEMENT WITH  ULTRASOUND AND FLUOROSCOPY FLUOROSCOPY TIME:  0.1 minute, 11 uGym2 DAP TECHNIQUE: After written informed consent was obtained, patient was placed in the supine position on angiographic table. Patency of the right basilic vein was confirmed with ultrasound with image documentation. An appropriate skin site was determined. Skin site was marked. Region was prepped using maximum barrier technique including cap and mask, sterile gown, sterile gloves, large sterile sheet, and Chlorhexidine as cutaneous antisepsis. The region was infiltrated locally with 1% lidocaine. Under real-time ultrasound guidance, the right basilic vein was accessed with a 21 gauge micropuncture needle; the needle tip within the vein was confirmed with ultrasound image documentation. Needle exchanged over a 018 guidewire for a peel-away sheath, through which a 5-French double-lumen power injectable PICC trimmed to 38cm was advanced, positioned with its tip near the cavoatrial junction. Spot chest radiograph confirms appropriate catheter position. Catheter was flushed per protocol and secured externally. The patient tolerated procedure well. COMPLICATIONS: COMPLICATIONS none IMPRESSION: 1. Technically successful five [place] double lumen power injectable PICC placement Electronically Signed   By: Lucrezia Europe M.D.   On: 05/23/2015 16:40   Ir US Guide Vasc Access Right  05/23/2015   CLINICAL DATA:  Diabetic ketoacidosis, needs venous access for IV therapies EXAM: PICC PLACEMENT WITH ULTRASOUND AND FLUOROSCOPY FLUOROSCOPY TIME:  0.1 minute, 11 uGym2 DAP TECHNIQUE: After written informed consent was obtained, patient was placed in the supine position on angiographic table. Patency of the right basilic vein was confirmed with ultrasound with image documentation. An appropriate skin site was determined. Skin site was marked. Region was prepped using maximum barrier technique including cap and mask, sterile gown, sterile gloves, large sterile sheet, and Chlorhexidine as cutaneous antisepsis. The region was infiltrated locally with 1% lidocaine. Under real-time ultrasound guidance, the right basilic vein was accessed with a 21 gauge micropuncture needle; the needle tip within the vein was confirmed with ultrasound image documentation. Needle exchanged over a 018 guidewire for a peel-away sheath, through which a 5-French double-lumen power injectable PICC trimmed to 38cm was advanced, positioned with its tip near the cavoatrial junction. Spot chest radiograph confirms appropriate catheter position. Catheter was flushed per protocol and secured externally. The patient tolerated procedure well. COMPLICATIONS: COMPLICATIONS none IMPRESSION: 1. Technically successful five [place] double lumen power injectable PICC placement Electronically Signed   By: Lucrezia Europe M.D.   On: 05/23/2015 16:40   Dg Abd Acute W/chest  05/23/2015  CLINICAL DATA:  49 year old female with chest pain cough and abdominal pain today. Initial encounter. EXAM: DG ABDOMEN ACUTE W/ 1V CHEST COMPARISON:  Chest radiographs 05/17/2015 and earlier. CT Abdomen and Pelvis 05/14/2015 FINDINGS: Semi upright AP view of the chest at 1137 hours. The lungs appear clear. Normal cardiac size and mediastinal contours. Visualized tracheal air column is within normal limits. No pneumothorax or pneumoperitoneum. No pneumoperitoneum on the  left-side-down lateral decubitus view of the abdomen. Non obstructed bowel gas pattern, retained stool from the descending colon to the rectum. Decompressed stomach. Stable cholecystectomy clips. Abdominal and pelvic visceral contours are within normal limits. No acute osseous abnormality identified. IMPRESSION: 1.  Normal bowel gas pattern, no free air. 2.  No acute cardiopulmonary abnormality. Electronically Signed   By: Genevie Ann M.D.   On: 05/23/2015 12:16    Scheduled Meds: . sodium chloride   Intravenous STAT  . dronabinol  10 mg Oral BID  . enoxaparin (LOVENOX) injection  40 mg Subcutaneous Q24H  . ferrous sulfate  325 mg Oral Daily  . FLUoxetine  40 mg Oral Daily  . insulin aspart  0-15 Units Subcutaneous TID WC  . insulin detemir  15 Units Subcutaneous QHS  . insulin regular  0-10 Units Intravenous TID WC  . lidocaine      . lisinopril  10 mg Oral Daily  . pantoprazole  40 mg Oral Daily  . [START ON 05/24/2015] potassium chloride  10 mEq Oral Daily  . pregabalin  150 mg Oral BID  . sodium chloride  3 mL Intravenous Q12H   Continuous Infusions: . sodium chloride Stopped (05/23/15 0827)  . dextrose 5 % and 0.45% NaCl 100 mL/hr at 05/23/15 1400  . insulin (NOVOLIN-R) infusion 0.5 Units/hr (05/23/15 1458)    Active Problems:   DKA, type 1 ([place])   [name], [facility] Pager [phone]. If 7PM-7AM, please contact night-coverage at [email or web address], password [facility] 05/23/2015, 5:10 PM  LOS: 0 days

## 2015-05-23 NOTE — Care Management Note (Signed)
Case Management Note  Patient Details  Name: [name] MRN: [id number] Date of Birth: 01-02-1966  Subjective/Objective:                 dka   Action/Plan:Date: May 23, 2015 Chart reviewed for concurrent status and case management needs. Will continue to follow patient for changes and needs: Velva Harman, RN, BSN, [place]   [phone]   Expected Discharge Date:   (UNKNOWN)               Expected Discharge Plan:  Home/Self Care  In-House Referral:  NA  Discharge planning Services  CM Consult  Post Acute Care Choice:  NA Choice offered to:  NA  DME Arranged:    DME Agency:     HH Arranged:    HH Agency:     Status of Service:  In process, will continue to follow  Medicare Important Message Given:    Date Medicare IM Given:    Medicare IM give by:    Date Additional Medicare IM Given:    Additional Medicare Important Message give by:     If discussed at [place] of Stay Meetings, dates discussed:    Additional Comments:  Leeroy Cha, RN 05/23/2015, 12:00 PM

## 2015-05-23 NOTE — Progress Notes (Signed)
Informed Dr. Wyline Copas about recent blood sugars, labs, and advancing diet.  Irven Baltimore, RN

## 2015-05-23 NOTE — ED Provider Notes (Deleted)
49 year old female with known history of diabetic gastroparesis and frequent episodes of ketoacidosis had just been discharged from the hospital 3 days ago. She was doing well until this morning when she started having generalized abdominal pain with vomiting. He is also complaining of being extremely anxious. On exam, she does not appear to have [place] Respiration. Lungs are clear. Abdomen is tender diffusely with bowel sounds decreased. Per old records, recent admission was for ketoacidosis. She will need to be screened for that. Blood sugar is only 450 today. She'll be given IV fluids as well as lorazepam and ondansetron.  Laboratory workup but demonstrates that she is back in ketoacidosis. She started on glucose stabilizer and will need to be admitted.  CRITICAL CARE Performed by: [id number] Total critical care time: 65 minutes Critical care time was exclusive of separately billable procedures and treating other patients. Critical care was necessary to treat or prevent imminent or life-threatening deterioration. Critical care was time spent personally by me on the following activities: development of treatment plan with patient and/or surrogate as well as nursing, discussions with consultants, evaluation of patient's response to treatment, examination of patient, obtaining history from patient or surrogate, ordering and performing treatments and interventions, ordering and review of laboratory studies, ordering and review of radiographic studies, pulse oximetry and re-evaluation of patient's condition.   Medical screening examination/treatment/procedure(s) were conducted as a shared visit with non-physician practitioner(s) and myself.  I personally evaluated the patient during the encounter.    Delora Fuel, MD [id number]

## 2015-05-23 NOTE — ED Provider Notes (Signed)
CSN: [id number]     Arrival date & time 05/23/15  [id number] History   First MD Initiated Contact with Patient 05/23/15 0400     Chief Complaint  Patient presents with  . Hyperglycemia  . Emesis  . Gastroparesis      (Consider location/radiation/quality/duration/timing/severity/associated sxs/prior Treatment) Patient is a 49 y.o. female presenting with hyperglycemia and vomiting. The history is provided by the patient. No language interpreter was used.  Hyperglycemia Severity:  Moderate Associated symptoms: abdominal pain, nausea and vomiting   Associated symptoms: no chest pain, no dysuria, no fever and no shortness of breath   Associated symptoms comment:  Patient with a history of Type 1 DM h/o recurrent DKA last admission 05/17/15 (d/c 05/20/15) presents with nausea, vomiting and abdominal pain that started earlier tonight. No fever. She reports her blood sugar was found to be high at home. She denies missing any of her insulin until vomiting started and she couldn't keep anything on her stomach. No hematemesis.  Emesis Associated symptoms: abdominal pain   Associated symptoms: no chills and no myalgias     Past Medical History  Diagnosis Date  . Diabetes mellitus without complication [facility])    Past Surgical History  Procedure Laterality Date  . Retinal detachment surgery    . Posterior vitrectomy and membrane peel-left eye  09/28/2002  . Posterior vitrectomy and membrane peel-right eye  03/16/2002  . Eye surgery    . Gailstones    . Esophagogastroduodenoscopy  09/27/2014    at [facility], Dr Rolan Lipa. biospy neg for celiac, neg for H pylori.   . Colonoscopy  09/27/2014    at [facility]   Family History  Problem Relation Age of Onset  . Cystic fibrosis Mother   . Hypertension Father   . Diabetes Brother   . Hypertension Maternal Grandmother    Social History  Substance Use Topics  . Smoking status: Never Smoker   . Smokeless tobacco: Never Used  . Alcohol Use: No   OB History     No data available     Review of Systems  Constitutional: Negative for fever and chills.  Respiratory: Negative.  Negative for shortness of breath.   Cardiovascular: Negative.  Negative for chest pain.  Gastrointestinal: Positive for nausea, vomiting and abdominal pain.  Genitourinary: Negative for dysuria.  Musculoskeletal: Negative.  Negative for myalgias.  Skin: Negative.  Negative for color change.  Neurological: Negative.  Negative for syncope.      Allergies  Anesthetics, amide; Penicillins; Buprenorphine hcl; Encainide; Metoclopramide; and Morphine and related  Home Medications   Prior to Admission medications   Medication Sig Start Date End Date Taking? Authorizing Provider  ALPRAZolam (XANAX) 0.25 MG tablet Take 1 tablet (0.25 mg total) by mouth 3 (three) times daily as needed for anxiety. 05/20/15   Robbie Lis, MD  dronabinol (MARINOL) 10 MG capsule Take 10 mg by mouth 2 (two) times daily. 02/04/15   Historical Provider, MD  ferrous sulfate 325 (65 FE) MG tablet Take 325 mg by mouth daily.     Historical Provider, MD  FLUoxetine (PROZAC) 40 MG capsule Take 40 mg by mouth daily.    Historical Provider, MD  furosemide (LASIX) 40 MG tablet Take 40 mg by mouth daily.    Historical Provider, MD  hydrOXYzine (ATARAX/VISTARIL) 50 MG tablet Take 50 mg by mouth every 4 (four) hours as needed for anxiety.  05/05/15   Historical Provider, MD  insulin aspart (NOVOLOG) 100 UNIT/ML injection Inject 0-15 Units  into the skin 3 (three) times daily with meals. 6-15 units three times daily with food.  Per sliding scale 03/12/15   Belkys A Regalado, MD  insulin detemir (LEVEMIR) 100 UNIT/ML injection Inject 0.15 mLs (15 Units total) into the skin at bedtime. 02/17/13   Robbie Lis, MD  lisinopril (PRINIVIL,ZESTRIL) 10 MG tablet Take 10 mg by mouth daily. 10/13/14   Historical Provider, MD  ondansetron (ZOFRAN ODT) 4 MG disintegrating tablet Take 1 tablet (4 mg total) by mouth every 8  (eight) hours as needed for nausea. 05/20/15   Robbie Lis, MD  pantoprazole (PROTONIX) 40 MG tablet Take 40 mg by mouth 2 (two) times daily. 01/04/15   Historical Provider, MD  potassium chloride (K-DUR) 10 MEQ tablet Take 1 tablet (10 mEq total) by mouth daily. 04/27/15   Samantha Tripp Dowless, PA-C  pregabalin (LYRICA) 150 MG capsule Take 1 capsule (150 mg total) by mouth 2 (two) times daily. 02/17/13   Robbie Lis, MD  promethazine (PHENERGAN) 25 MG suppository Place 1 suppository (25 mg total) rectally every 6 (six) hours as needed for nausea or vomiting. 04/27/15   Samantha Tripp Dowless, PA-C  promethazine (PHENERGAN) 25 MG tablet Take 1 tablet (25 mg total) by mouth every 6 (six) hours as needed for nausea or vomiting. 04/27/15   Samantha Tripp Dowless, PA-C   BP 183/92 mmHg  Pulse 113  Temp(Src) 97.3 F (36.3 C) (Oral)  Resp 22  SpO2 99%  LMP 04/09/2015 (Exact Date) Physical Exam  Constitutional: She is oriented to person, place, and time. She appears well-developed and well-nourished.  HENT:  Head: Normocephalic.  Mouth/Throat: Mucous membranes are dry.  Neck: Normal range of motion. Neck supple.  Cardiovascular: Normal rate and regular rhythm.   Pulmonary/Chest: Effort normal and breath sounds normal. She has no wheezes. She has no rales.  Abdominal: Soft. There is tenderness. There is no rebound and no guarding.  Diffusely tender abdomen.  Musculoskeletal: Normal range of motion. She exhibits no edema.  Neurological: She is alert and oriented to person, place, and time.  Skin: Skin is warm and dry. No rash noted.  Psychiatric: She has a normal mood and affect.    ED Course  Procedures (including critical care time) Labs Review Labs Reviewed  CBC - Abnormal; Notable for the following:    WBC 14.7 (*)    RBC 3.77 (*)    Hemoglobin 10.6 (*)    HCT 32.0 (*)    All other components within normal limits  URINALYSIS, ROUTINE W REFLEX MICROSCOPIC (NOT AT [facility] - Abnormal;  Notable for the following:    APPearance CLOUDY (*)    Glucose, UA >1000 (*)    Hgb urine dipstick TRACE (*)    Ketones, ur >80 (*)    Protein, ur 30 (*)    All other components within normal limits  DIFFERENTIAL - Abnormal; Notable for the following:    Neutro Abs 13.3 (*)    All other components within normal limits  URINE MICROSCOPIC-ADD ON - Abnormal; Notable for the following:    Squamous Epithelial / LPF 6-30 (*)    Bacteria, UA FEW (*)    All other components within normal limits  CBG MONITORING, ED - Abnormal; Notable for the following:    Glucose-Capillary 452 (*)    All other components within normal limits  BASIC METABOLIC PANEL  HEPATIC FUNCTION PANEL  LIPASE, BLOOD    Imaging Review No results found. I have personally reviewed and  evaluated these images and lab results as part of my medical decision-making.   EKG Interpretation None      MDM   Final diagnoses:  None    1. DKA, Type 1 DM  The patient is receiving multiple fluid boluses, insulin via glucostabilizer, potassium. Vomiting is currently controlled. She is mentating well, no respiratory distress. The patient is evaluated by dr. Roxanne Mins. Discussed admission with Dr. Tamala Julian of [facility] who accepts for admission.    Charlann Lange, PA-C [id number]  Delora Fuel, MD [id number]

## 2015-05-23 NOTE — H&P (Addendum)
[facility] History and Physical  [name] [id number] DOB: 1966-05-08 DOA: 05/23/2015  Referring physician: ED PCP: Barbette Merino, MD   Chief Complaint: Nausea, vomiting, and abdominal pain  HPI:  [name] is a 49 year old female with a past medical history of hypertension, anxiety, and type 1 complicated with gastroparesis and neuropathy; who is just hospitalized from 11/22 to 11/25 for symptoms of nausea vomiting abdominal pain found to be in DKA. Patient again presents today with similar symptoms stating she was doing fine until she had episodes of nausea and vomiting. She couldn't keep anything on her stomach and did not continued keeping up with her insulin regimen. Along the same time reports having significant abdominal pain and notes associated symptoms of a little bit of constipation. Patient reports non-bloody emesis. Denies chest pain, shortness of breath, loss of consciousness, or falls. Patient notes that she sees a gastroenterologist at [facility] for gastroparesis, and he had started her on Marinol.   Upon admission into the hospital a comprehensive metabolic profile had a sodium of 134 bicarbonate of 16, anion gap of 25, glucose of 494,potassium of 3.4. UA was positive for ketones and negative for any signs of infection.  Review of Systems  Constitutional: Positive for malaise/fatigue. Negative for chills.  HENT: Negative for ear pain and tinnitus.   Eyes: Negative for double vision and photophobia.  Respiratory: Negative for hemoptysis and sputum production.   Cardiovascular: Positive for palpitations. Negative for orthopnea and claudication.  Gastrointestinal: Positive for nausea, vomiting and abdominal pain.  Genitourinary: Negative for urgency and frequency.  Musculoskeletal: Negative for falls and neck pain.  Skin: Negative for itching and rash.  Neurological: Negative for speech change and seizures.  Endo/Heme/Allergies: Positive for polydipsia. Does  not bruise/bleed easily.  Psychiatric/Behavioral: Negative for hallucinations. The patient is nervous/anxious.        Past Medical History  Diagnosis Date  . Diabetes mellitus without complication [facility])      Past Surgical History  Procedure Laterality Date  . Retinal detachment surgery    . Posterior vitrectomy and membrane peel-left eye  09/28/2002  . Posterior vitrectomy and membrane peel-right eye  03/16/2002  . Eye surgery    . Gailstones    . Esophagogastroduodenoscopy  09/27/2014    at [facility], Dr Rolan Lipa. biospy neg for celiac, neg for H pylori.   . Colonoscopy  09/27/2014    at [facility]      Social History:  reports that she has never smoked. She has never used smokeless tobacco. She reports that she does not drink alcohol or use illicit drugs. Where does patient live--home   and with whom if at home? Can patient participate in ADLs? Yes   Allergies  Allergen Reactions  . Anesthetics, Amide Nausea And Vomiting  . Penicillins Diarrhea and Nausea And Vomiting    Has patient had a PCN reaction causing immediate rash, facial/tongue/throat swelling, SOB or lightheadedness with hypotension: No Has patient had a PCN reaction causing severe rash involving mucus membranes or skin necrosis: No Has patient had a PCN reaction that required hospitalization Yes  Has patient had a PCN reaction occurring within the last 10 years: Yes  If all of the above answers are "NO", then may proceed with Cephalosporin use.   . Buprenorphine Hcl Rash  . Encainide Nausea And Vomiting  . Metoclopramide Rash  . Morphine And Related Rash    Family History  Problem Relation Age of Onset  . Cystic fibrosis Mother   . Hypertension  Father   . Diabetes Brother   . Hypertension Maternal Grandmother         Prior to Admission medications   Medication Sig Start Date End Date Taking? Authorizing Provider  ALPRAZolam (XANAX) 0.25 MG tablet Take 1 tablet (0.25 mg total) by mouth 3 (three)  times daily as needed for anxiety. 05/20/15   Robbie Lis, MD  dronabinol (MARINOL) 10 MG capsule Take 10 mg by mouth 2 (two) times daily. 02/04/15   Historical Provider, MD  ferrous sulfate 325 (65 FE) MG tablet Take 325 mg by mouth daily.     Historical Provider, MD  FLUoxetine (PROZAC) 40 MG capsule Take 40 mg by mouth daily.    Historical Provider, MD  furosemide (LASIX) 40 MG tablet Take 40 mg by mouth daily.    Historical Provider, MD  hydrOXYzine (ATARAX/VISTARIL) 50 MG tablet Take 50 mg by mouth every 4 (four) hours as needed for anxiety.  05/05/15   Historical Provider, MD  insulin aspart (NOVOLOG) 100 UNIT/ML injection Inject 0-15 Units into the skin 3 (three) times daily with meals. 6-15 units three times daily with food.  Per sliding scale 03/12/15   Belkys A Regalado, MD  insulin detemir (LEVEMIR) 100 UNIT/ML injection Inject 0.15 mLs (15 Units total) into the skin at bedtime. 02/17/13   Robbie Lis, MD  lisinopril (PRINIVIL,ZESTRIL) 10 MG tablet Take 10 mg by mouth daily. 10/13/14   Historical Provider, MD  ondansetron (ZOFRAN ODT) 4 MG disintegrating tablet Take 1 tablet (4 mg total) by mouth every 8 (eight) hours as needed for nausea. 05/20/15   Robbie Lis, MD  pantoprazole (PROTONIX) 40 MG tablet Take 40 mg by mouth 2 (two) times daily. 01/04/15   Historical Provider, MD  potassium chloride (K-DUR) 10 MEQ tablet Take 1 tablet (10 mEq total) by mouth daily. 04/27/15   Samantha Tripp Dowless, PA-C  pregabalin (LYRICA) 150 MG capsule Take 1 capsule (150 mg total) by mouth 2 (two) times daily. 02/17/13   Robbie Lis, MD  promethazine (PHENERGAN) 25 MG suppository Place 1 suppository (25 mg total) rectally every 6 (six) hours as needed for nausea or vomiting. 04/27/15   Samantha Tripp Dowless, PA-C  promethazine (PHENERGAN) 25 MG tablet Take 1 tablet (25 mg total) by mouth every 6 (six) hours as needed for nausea or vomiting. 04/27/15   Carlos Levering, PA-C     Physical  Exam: Filed Vitals:   05/23/15 0405  BP: 183/92  Pulse: 113  Temp: 97.3 F (36.3 C)  TempSrc: Oral  Resp: 22  SpO2: 99%     Constitutional: Vital signs reviewed. Patient is a moderate distress reporting extreme pain. Alert and oriented x3.  Head: Normocephalic and atraumatic  Ear: TM normal bilaterally  Mouth: no erythema or exudates, dry mucous membranes Eyes: PERRL, EOMI, conjunctivae normal, No scleral icterus.  Neck: Supple, Trachea midline normal ROM, No JVD, mass, thyromegaly, or carotid bruit present.  Cardiovascular: Tachycardic , no MRG, pulses symmetric and intact bilaterally  Pulmonary/Chest: CTAB, no wheezes, rales, or rhonchi  Abdominal: Soft. Patient reports diffuse tenderness to palpation, non-distended, bowel sounds are normal, no masses, organomegaly, or guarding present.  GU: no CVA tenderness Musculoskeletal: No joint deformities, erythema, or stiffness, ROM full and no nontender Ext: no edema and no cyanosis, pulses palpable bilaterally (DP and PT)  Hematology: no cervical, inginal, or axillary adenopathy.  Neurological: A&O x3, Strenght is normal and symmetric bilaterally, cranial nerve II-XII are grossly intact, no focal  motor deficit, sensory intact to light touch bilaterally.  Skin: Warm, dry and intact. No rash, cyanosis, or clubbing.  Psychiatric: Anxious with poor judgment.      Data Review   Micro Results Recent Results (from the past 240 hour(s))  Urine culture     Status: None   Collection Time: 05/17/15 11:18 AM  Result Value Ref Range Status   Specimen Description URINE, CLEAN CATCH  Final   Special Requests NONE  Final   Culture   Final    MULTIPLE SPECIES PRESENT, SUGGEST RECOLLECTION Performed at [facility]    Report Status 05/18/2015 FINAL  Final  MRSA PCR Screening     Status: None   Collection Time: 05/17/15 12:23 PM  Result Value Ref Range Status   MRSA by PCR NEGATIVE NEGATIVE Final    Comment:        The GeneXpert  MRSA Assay (FDA approved for NASAL specimens only), is one component of a comprehensive MRSA colonization surveillance program. It is not intended to diagnose MRSA infection nor to guide or monitor treatment for MRSA infections.     Radiology Reports Dg Chest 2 View  05/17/2015  CLINICAL DATA:  Nausea, vomiting since last night.  DKA. EXAM: CHEST  2 VIEW COMPARISON:  05/14/2015 FINDINGS: The heart size and mediastinal contours are within normal limits. Both lungs are clear. The visualized skeletal structures are unremarkable. IMPRESSION: No active cardiopulmonary disease. Electronically Signed   By: Rolm Baptise M.D.   On: 05/17/2015 11:50   Dg Chest 2 View  05/14/2015  CLINICAL DATA:  N/V today; no chest complaints today, pleural effusion seen on previous Abdomen CT that was done this morning per Pa's note; hx asthma; non smoker; HTn; diabetic EXAM: CHEST  2 VIEW COMPARISON:  05/08/2015 FINDINGS: The heart size and mediastinal contours are within normal limits. Both lungs are clear. No pleural effusion or pneumothorax. The visualized skeletal structures are unremarkable. IMPRESSION: No active cardiopulmonary disease. Electronically Signed   By: Lajean Manes M.D.   On: 05/14/2015 09:49   Ct Abdomen Pelvis W Contrast  05/14/2015  CLINICAL DATA:  Abdominal pain and nausea and vomiting. Elevated lipase. EXAM: CT ABDOMEN AND PELVIS WITH CONTRAST TECHNIQUE: Multidetector CT imaging of the abdomen and pelvis was performed using the standard protocol following bolus administration of intravenous contrast. CONTRAST:  35mL OMNIPAQUE IOHEXOL 300 MG/ML SOLN, 146mL OMNIPAQUE IOHEXOL 300 MG/ML SOLN COMPARISON:  Radiograph dated 05/08/2015 and CT scan dated 03/10/2015 FINDINGS: Lower chest: There is a new small right pleural effusion with slight atelectasis at the right lung base laterally and posteriorly. Small chronic hiatal hernia. Hepatobiliary: Cholecystectomy. Focal fatty infiltration in the left  lobe adjacent to the falciform ligament. No biliary ductal dilatation. Pancreas: Normal. Spleen: Normal. Adrenals/Urinary Tract: Normal adrenal glands and kidneys. Slight prominence of the ureters. Chronic distention of the bladder to the level of the umbilicus. The bladder has been abnormally distended dating back to 03/18/2010. Stomach/Bowel: Small hiatal hernia. Extensive stool in the nondistended colon. The sigmoid portion of the colon is compressed by the distended urinary bladder. There is stool in the rectum. Vascular/Lymphatic: Minimal calcification in the abdominal aorta. No adenopathy. Reproductive: 12 mm cyst on the left ovary. Small calcified fibroid in the left side of the uterine fundus. Other: No free air or free fluid. Musculoskeletal: Normal. IMPRESSION: 1. New small right pleural effusion with slight atelectasis at the right lung base. 2. Marked chronic distention of the urinary bladder with secondary slight dilatation  of the ureters. The sigmoid portion of the colon is also compressed by the distended urinary bladder. 3. Normal appearing pancreas. Electronically Signed   By: Lorriane Shire M.D.   On: 05/14/2015 09:12   Dg Chest Port 1 View  05/17/2015  CLINICAL DATA:  Right IJ line placement.  Initial encounter. EXAM: PORTABLE CHEST 1 VIEW COMPARISON:  Chest radiograph performed earlier today at 11:23 a.m. FINDINGS: The patient's right IJ line is noted ending overlying the right atrium. This could be retracted approximately 3 cm to the cavoatrial junction. The lungs are relatively well expanded and appear grossly clear. No focal consolidation, pleural effusion or pneumothorax is seen. The cardiomediastinal silhouette is normal in size. No acute osseous abnormalities are identified. Clips are noted within the right upper quadrant, reflecting prior cholecystectomy. IMPRESSION: 1. Right IJ line noted ending overlying the right atrium. This could be retracted approximately 3 cm to the cavoatrial  junction. 2. No acute cardiopulmonary process seen. Electronically Signed   By: Garald Balding M.D.   On: 05/17/2015 21:45   Dg Abd 2 Views  05/17/2015  CLINICAL DATA:  Nausea and vomiting with hyperglycemia EXAM: ABDOMEN - 2 VIEW COMPARISON:  CT abdomen and pelvis May 14, 2015 FINDINGS: Supine and left lateral decubitus images were obtained. There is diffuse stool throughout the colon. No bowel dilatation or air-fluid level suggesting obstruction. No free air. There are surgical clips the right upper quadrant. Visualized lung bases are clear. No abnormal calcifications. IMPRESSION: No demonstrable obstruction or free air. Diffuse stool throughout colon. Electronically Signed   By: Lowella Grip III M.D.   On: 05/17/2015 11:45     CBC  Recent Labs Lab 05/17/15 0826 05/18/15 0330 05/23/15 0449  WBC 10.5 10.1 14.7*  HGB 10.1* 8.3* 10.6*  HCT 31.6* 25.9* 32.0*  PLT 432* 363 387  MCV 86.1 88.4 84.9  MCH 27.5 28.3 28.1  MCHC 32.0 32.0 33.1  RDW 14.9 15.6* 15.4  LYMPHSABS  --   --  0.8  MONOABS  --   --  0.5  EOSABS  --   --  0.0  BASOSABS  --   --  0.0    Chemistries   Recent Labs Lab 05/17/15 0826 05/17/15 1133  05/18/15 0330 05/18/15 0600 05/18/15 1005 05/19/15 0515 05/23/15 0449  NA 135 137  < > 140 140 140 132* 134*  K 4.0 4.4  < > 3.7 3.5 3.1* 3.4* 3.3*  CL 98* 106  < > 115* 116* 115* 109 93*  CO2 13* 12*  < > 16* 19* 20* 19* 16*  GLUCOSE 469* 469*  < > 216* 224* 93 64* 494*  BUN 15 16  < > 11 10 10 6 14   CREATININE 1.12* 1.24*  < > 1.08* 0.98 0.96 0.95 1.06*  CALCIUM 9.7 8.5*  < > 8.0* 7.8* 8.1* 7.7* 9.4  MG  --  1.6*  --  2.2  --   --   --   --   AST 22  --   --   --   --   --   --  24  ALT 31  --   --   --   --   --   --  21  ALKPHOS 105  --   --   --   --   --   --  92  BILITOT 2.0*  --   --   --   --   --   --  1.6*  < > = values in this interval not  displayed. ------------------------------------------------------------------------------------------------------------------ estimated creatinine clearance is 51.3 mL/min (by C-G formula based on Cr of 1.06). ------------------------------------------------------------------------------------------------------------------ No results for input(s): HGBA1C in the last 72 hours. ------------------------------------------------------------------------------------------------------------------ No results for input(s): CHOL, HDL, LDLCALC, TRIG, CHOLHDL, LDLDIRECT in the last 72 hours. ------------------------------------------------------------------------------------------------------------------ No results for input(s): TSH, T4TOTAL, T3FREE, THYROIDAB in the last 72 hours.  Invalid input(s): FREET3 ------------------------------------------------------------------------------------------------------------------ No results for input(s): VITAMINB12, FOLATE, FERRITIN, TIBC, IRON, RETICCTPCT in the last 72 hours.  Coagulation profile No results for input(s): INR, PROTIME in the last 168 hours.  No results for input(s): DDIMER in the last 72 hours.  Cardiac Enzymes  Recent Labs Lab 05/18/15 1715 05/18/15 2320 05/19/15 0515  TROPONINI 0.48* 0.44* 0.47*   ------------------------------------------------------------------------------------------------------------------ Invalid input(s): POCBNP   CBG:  Recent Labs Lab 05/19/15 1733 05/19/15 2147 05/20/15 0755 05/20/15 1237 05/23/15 0409  GLUCAP 97 221* 249* 183* 452*       EKG: ordered  Assessment/Plan Active Problems:   DKA, type 1 ([place])  DKA in a type I diabetic. Recurrent. Patient just admitted 11/22 through 11/25 with same symptoms. Patient with nausea vomiting of diffuse abdominal pain that she states is consistent gastroparesis. Patient's specifically request Ativan, morphine, and Phenergan.  Urinalysis is nitrite  negative,  LE negative, and positive for ketones. Patient with an Anion Gap 25. With a bicarbonate level of 16 and a glucose of 494.  -check magnesium and phosphorus - IV fluids, glucose stabilizer -Once anion gap is closed and acidosis has resolve transition to subcutaneous insulin -Diabetes educator -Care management consult  Nausea/vomiting/abdominal pain: DDx secondary to patient's gastroparesis versus the possibility of withdrawls vs constipation.   -IV Zofran and Compazine -Nothing by mouth except meds, and advance as tolerated to a carb modified diet -checking UDS on initial urine collected  -Check acute abdominal series with flat and upright   Leukocytosis WBC count elevated at 14.7 no temperature noted unclear cause  -Check chest x-ray  Hypertension currently uncontrolled. Possibly elevated acutely due to patient's report of abdominal pain symptoms. -Continue patient's medications of lisinopril when to tolerate by PO  Anemia: Chronic. Check an anemia panel. Follow H&H. -follow-up CBC and monitor  Dehydration: acute secondary to nausea vomiting -IV fluids  Pseudo-Hyponatremia-sodium level corrects within normal limits when adjusted for hyperglycemia  Anxiety -Continue fluoxetine when able -IV Ativan until able to take by mouth Xanax per home dose; then discontinue IV  Hypokalemia-initially K- 3.3 -Replaced  -Repeat BMP to follow replace as needed  DVT prophylaxis Lovenox  Code Status:   full Family Communication: bedside Disposition Plan: admit   Total time spent 55 minutes.Greater than 50% of this time was spent in counseling, explanation of diagnosis, planning of further management, and coordination of care  [facility] Pager [phone]  If 7PM-7AM, please contact night-coverage [email or web address] Password [facility] 05/23/2015, 5:53 AM

## 2015-05-23 NOTE — Progress Notes (Signed)
Lab was not able to get blood for labs after three attempts.  Informed. Dr Wyline Copas.  PICC ordered.  Irven Baltimore, RN

## 2015-05-24 DIAGNOSIS — E1165 Type 2 diabetes mellitus with hyperglycemia: Secondary | ICD-10-CM

## 2015-05-24 DIAGNOSIS — Z794 Long term (current) use of insulin: Secondary | ICD-10-CM | POA: Diagnosis not present

## 2015-05-24 DIAGNOSIS — E872 Acidosis: Secondary | ICD-10-CM

## 2015-05-24 DIAGNOSIS — E1140 Type 2 diabetes mellitus with diabetic neuropathy, unspecified: Secondary | ICD-10-CM | POA: Diagnosis not present

## 2015-05-24 DIAGNOSIS — E1010 Type 1 diabetes mellitus with ketoacidosis without coma: Principal | ICD-10-CM

## 2015-05-24 LAB — BASIC METABOLIC PANEL
Anion gap: 5 (ref 5–15)
BUN: 11 mg/dL (ref 6–20)
CALCIUM: 8.7 mg/dL — AB (ref 8.9–10.3)
CO2: 27 mmol/L (ref 22–32)
Chloride: 105 mmol/L (ref 101–111)
Creatinine, Ser: 0.93 mg/dL (ref 0.44–1.00)
GFR calc Af Amer: >60 mL/min (ref >60)
GLUCOSE: 47 mg/dL — AB (ref 65–99)
POTASSIUM: 3.1 mmol/L — AB (ref 3.5–5.1)
Sodium: 137 mmol/L (ref 135–145)

## 2015-05-24 LAB — GLUCOSE, CAPILLARY
Glucose-Capillary: 138 mg/dL — ABNORMAL HIGH (ref 65–99)
Glucose-Capillary: 39 mg/dL — CL (ref 65–99)
Glucose-Capillary: 136 mg/dL — ABNORMAL HIGH (ref 65–99)

## 2015-05-24 MED ORDER — POTASSIUM CHLORIDE CRYS ER 20 MEQ PO TBCR
40.0000 meq | EXTENDED_RELEASE_TABLET | Freq: Two times a day (BID) | ORAL | Status: DC
  Administered 2015-05-24: 40 meq via ORAL
  Filled 2015-05-24: qty 2

## 2015-05-24 NOTE — Progress Notes (Signed)
Patient verbalized understanding of discharge instructions. Patient is stable at discharge. 

## 2015-05-24 NOTE — Progress Notes (Signed)
Inpatient Diabetes Program Recommendations  AACE/ADA: New Consensus Statement on Inpatient Glycemic Control (2015)  Target Ranges:  Prepandial:   less than 140 mg/dL      Peak postprandial:   less than 180 mg/dL (1-2 hours)      Critically ill patients:  140 - 180 mg/dL   Review of Glycemic Control  Results for [name], [name] (MRN [id number]) as of 05/24/2015 11:00  Ref. Range 05/23/2015 20:31 05/23/2015 21:52 05/24/2015 07:36 05/24/2015 07:38 05/24/2015 08:16  Glucose-Capillary Latest Ref Range: 65-99 mg/dL 132 (H) 116 (H) 35 (LL) 39 (LL) 138 (H)  Results for [name], [name] (MRN [id number]) as of 05/24/2015 11:00  Ref. Range 05/18/2015 06:00  Hemoglobin A1C Latest Ref Range: 4.8-5.6 % 7.3 (H)   Very labile blood sugars.  Long discussion with pt regarding her diabetes management at home. Pt states she lives with son, but he is gone most of the time. States she would like to have someone help her at home since she has been "feeling so bad." 12 ED visits and 7 admissions in past 6 months. Stress importance of taking her Levemir insulin even when she is sick. Discussed importance of checking blood sugars very frequently when feeling bad. Pt states she can hardly eat anything except for rice, potates, soups at home. Abdomen hurts "all the time." Will give gastroparesis information and discussed taking rapid-acting insulin AFTER meal instead of BEFORE meal to prevent hypoglycemia. Asked RN to consult care management regarding possibility of obtaining home health. Pt also wants referral to endocrinologist for her diabetes management.  Inpatient Diabetes Program Recommendations: Agree with Levemir 15 units Q24H. Pt states she takes this in the morning. Needs Novolog sensitive correction + meal coverage insulin (5 units tidwc when eating normal meal) Referral to endocrinologist for diabetes management. Case manager for home health needs.   Discussed above with RN. Thank you. Lorenda Peck, RD,  LDN, CDE Inpatient Diabetes Coordinator [phone]

## 2015-05-24 NOTE — Discharge Summary (Signed)
Physician Discharge Summary  [name] [id number] DOB: 1965-10-05 DOA: 05/23/2015  PCP: Barbette Merino, MD  Admit date: 05/23/2015 Discharge date: 05/24/2015  Time spent: 20 minutes  Recommendations for Outpatient Follow-up:  1. Follow up with PCP in 1-2 weeks   Discharge Diagnoses:  Active Problems:   High anion gap metabolic acidosis   Diabetic ketoacidosis without coma associated with type 1 diabetes mellitus ([place])   Uncontrolled type 2 diabetes mellitus with diabetic neuropathy, with long-term current use of insulin ([place])   Anxiety and depression   DKA, type 1 ([place])   Discharge Condition: Improved  Diet recommendation: Diabetic  Filed Weights   05/24/15 0401 05/24/15 0600  Weight: 60.5 kg (133 lb 6.1 oz) 60.5 kg (133 lb 6.1 oz)    History of present illness:  Please review dictated H and P from 11/28 for details. Briefly, 49 year old female with a past medical history of hypertension, anxiety, and type 1 complicated with gastroparesis and neuropathy; who is just hospitalized from 11/22 to 11/25 for symptoms of nausea vomiting abdominal pain found to be in DKA. Patient again presents today with similar symptoms stating she was doing fine until she had episodes of nausea and vomiting. She couldn't keep anything on her stomach and did not continued keeping up with her insulin regimen. Along the same time reports having significant abdominal pain and notes associated symptoms of a little bit of constipation. Patient reports non-bloody emesis. Denies chest pain, shortness of breath, loss of consciousness, or falls. Patient notes that she sees a gastroenterologist at [facility] for gastroparesis, and he had started her on [facility] Course:  DKA in a type I diabetic. Recurrent. Patient just admitted 11/22 through 11/25 with same symptoms. Patient with nausea vomiting of diffuse abdominal pain that she states is consistent gastroparesis. Urinalysis is nitrite negative,  LE negative, and positive for ketones. Patient with a presenting Anion Gap of 25. With a bicarbonate level of 16 and a glucose of 494.  - Patient was continued with IV fluids, glucose stabilizer -Anion gap has closed with hydration. Will transition back to subQ insulin per home regimen  Nausea/vomiting/abdominal pain: DDx secondary to patient's gastroparesis versus the possibility of withdrawls vs constipation.  -IV Zofran and Compazine -abd xray with nonobstructive bowel gas pattern -Improved and pt is tolerating regular diet  Leukocytosis  -Unclear etiology -Pt afebtrile -suspect stress reaction from DKA  Hypertension  -presented uncontrolled.  -Pt to resume home meds  Anemia: Chronic  Dehydration: acute secondary to nausea vomiting -IV fluids  Pseudo-Hyponatremia-sodium level corrected with IVF  Anxiety -Continue fluoxetine when able  Hypokalemia-initially K- 3.3 -Replaced  -Repeat BMP to follow replace as needed  DVT prophylaxis Lovenox  Discharge Exam: Filed Vitals:   05/24/15 0401 05/24/15 0500 05/24/15 0600 05/24/15 0800  BP:   108/48   Pulse:  78 74   Temp:    98 F (36.7 C)  TempSrc:    Oral  Resp:  13 0   Height:   5\' 2"  (1.575 m)   Weight: 60.5 kg (133 lb 6.1 oz)  60.5 kg (133 lb 6.1 oz)   SpO2:  99% 100%     General: Awake, in nad Cardiovascular: regular, s1, s2 Respiratory: normal resp effort, no wheezing  Discharge Instructions     Medication List    TAKE these medications        ALPRAZolam 0.25 MG tablet  Commonly known as:  XANAX  Take 1 tablet (0.25 mg total) by mouth 3 (  three) times daily as needed for anxiety.     dronabinol 10 MG capsule  Commonly known as:  MARINOL  Take 10 mg by mouth 2 (two) times daily.     ferrous sulfate 325 (65 FE) MG tablet  Take 325 mg by mouth 3 (three) times a week.     FLUoxetine 40 MG capsule  Commonly known as:  PROZAC  Take 40 mg by mouth daily.     furosemide 40 MG tablet   Commonly known as:  LASIX  Take 40 mg by mouth daily.     insulin aspart 100 UNIT/ML injection  Commonly known as:  novoLOG  Inject 0-15 Units into the skin 3 (three) times daily with meals. 6-15 units three times daily with food.  Per sliding scale     insulin detemir 100 UNIT/ML injection  Commonly known as:  LEVEMIR  Inject 0.15 mLs (15 Units total) into the skin at bedtime.     lisinopril 10 MG tablet  Commonly known as:  PRINIVIL,ZESTRIL  Take 10 mg by mouth daily.     LORazepam 0.5 MG tablet  Commonly known as:  ATIVAN  Take 0.5 mg by mouth 3 (three) times daily as needed for anxiety.     ondansetron 4 MG disintegrating tablet  Commonly known as:  ZOFRAN ODT  Take 1 tablet (4 mg total) by mouth every 8 (eight) hours as needed for nausea.     pantoprazole 40 MG tablet  Commonly known as:  PROTONIX  Take 40 mg by mouth daily.     potassium chloride 10 MEQ tablet  Commonly known as:  K-DUR  Take 1 tablet (10 mEq total) by mouth daily.     pregabalin 150 MG capsule  Commonly known as:  LYRICA  Take 1 capsule (150 mg total) by mouth 2 (two) times daily.     promethazine 25 MG tablet  Commonly known as:  PHENERGAN  Take 1 tablet (25 mg total) by mouth every 6 (six) hours as needed for nausea or vomiting.     promethazine 25 MG suppository  Commonly known as:  PHENERGAN  Place 1 suppository (25 mg total) rectally every 6 (six) hours as needed for nausea or vomiting.       Allergies  Allergen Reactions  . Anesthetics, Amide Nausea And Vomiting  . Penicillins Diarrhea and Nausea And Vomiting    Has patient had a PCN reaction causing immediate rash, facial/tongue/throat swelling, SOB or lightheadedness with hypotension: No Has patient had a PCN reaction causing severe rash involving mucus membranes or skin necrosis: No Has patient had a PCN reaction that required hospitalization No Has patient had a PCN reaction occurring within the last 10 years: Yes  If all of the  above answers are "NO", then may proceed with Cephalosporin use.   . Buprenorphine Hcl Rash  . Encainide Nausea And Vomiting  . Metoclopramide Rash   Follow-up Information    Follow up with GARBA,LAWAL, MD. Schedule an appointment as soon as possible for a visit in 1 week.   Specialty:  Internal Medicine   Why:  Hospital follow up   Contact information:   [address] [place] [address] [phone]        The results of significant diagnostics from this hospitalization (including imaging, microbiology, ancillary and laboratory) are listed below for reference.    Significant Diagnostic Studies: Dg Chest 2 View  05/17/2015  CLINICAL DATA:  Nausea, vomiting since last night.  DKA. EXAM: CHEST  2 VIEW COMPARISON:  05/14/2015 FINDINGS: The heart size and mediastinal contours are within normal limits. Both lungs are clear. The visualized skeletal structures are unremarkable. IMPRESSION: No active cardiopulmonary disease. Electronically Signed   By: Rolm Baptise M.D.   On: 05/17/2015 11:50   Dg Chest 2 View  05/14/2015  CLINICAL DATA:  N/V today; no chest complaints today, pleural effusion seen on previous Abdomen CT that was done this morning per Pa's note; hx asthma; non smoker; HTn; diabetic EXAM: CHEST  2 VIEW COMPARISON:  05/08/2015 FINDINGS: The heart size and mediastinal contours are within normal limits. Both lungs are clear. No pleural effusion or pneumothorax. The visualized skeletal structures are unremarkable. IMPRESSION: No active cardiopulmonary disease. Electronically Signed   By: Lajean Manes M.D.   On: 05/14/2015 09:49   Ct Abdomen Pelvis W Contrast  05/14/2015  CLINICAL DATA:  Abdominal pain and nausea and vomiting. Elevated lipase. EXAM: CT ABDOMEN AND PELVIS WITH CONTRAST TECHNIQUE: Multidetector CT imaging of the abdomen and pelvis was performed using the standard protocol following bolus administration of intravenous contrast. CONTRAST:  58mL OMNIPAQUE IOHEXOL  300 MG/ML SOLN, 174mL OMNIPAQUE IOHEXOL 300 MG/ML SOLN COMPARISON:  Radiograph dated 05/08/2015 and CT scan dated 03/10/2015 FINDINGS: Lower chest: There is a new small right pleural effusion with slight atelectasis at the right lung base laterally and posteriorly. Small chronic hiatal hernia. Hepatobiliary: Cholecystectomy. Focal fatty infiltration in the left lobe adjacent to the falciform ligament. No biliary ductal dilatation. Pancreas: Normal. Spleen: Normal. Adrenals/Urinary Tract: Normal adrenal glands and kidneys. Slight prominence of the ureters. Chronic distention of the bladder to the level of the umbilicus. The bladder has been abnormally distended dating back to 03/18/2010. Stomach/Bowel: Small hiatal hernia. Extensive stool in the nondistended colon. The sigmoid portion of the colon is compressed by the distended urinary bladder. There is stool in the rectum. Vascular/Lymphatic: Minimal calcification in the abdominal aorta. No adenopathy. Reproductive: 12 mm cyst on the left ovary. Small calcified fibroid in the left side of the uterine fundus. Other: No free air or free fluid. Musculoskeletal: Normal. IMPRESSION: 1. New small right pleural effusion with slight atelectasis at the right lung base. 2. Marked chronic distention of the urinary bladder with secondary slight dilatation of the ureters. The sigmoid portion of the colon is also compressed by the distended urinary bladder. 3. Normal appearing pancreas. Electronically Signed   By: Lorriane Shire M.D.   On: 05/14/2015 09:12   Ir Fluoro Guide Cv Line Right  05/23/2015  CLINICAL DATA:  Diabetic ketoacidosis, needs venous access for IV therapies EXAM: PICC PLACEMENT WITH ULTRASOUND AND FLUOROSCOPY FLUOROSCOPY TIME:  0.1 minute, 11 uGym2 DAP TECHNIQUE: After written informed consent was obtained, patient was placed in the supine position on angiographic table. Patency of the right basilic vein was confirmed with ultrasound with image  documentation. An appropriate skin site was determined. Skin site was marked. Region was prepped using maximum barrier technique including cap and mask, sterile gown, sterile gloves, large sterile sheet, and Chlorhexidine as cutaneous antisepsis. The region was infiltrated locally with 1% lidocaine. Under real-time ultrasound guidance, the right basilic vein was accessed with a 21 gauge micropuncture needle; the needle tip within the vein was confirmed with ultrasound image documentation. Needle exchanged over a 018 guidewire for a peel-away sheath, through which a 5-French double-lumen power injectable PICC trimmed to 38cm was advanced, positioned with its tip near the cavoatrial junction. Spot chest radiograph confirms appropriate catheter position. Catheter was flushed per protocol  and secured externally. The patient tolerated procedure well. COMPLICATIONS: COMPLICATIONS none IMPRESSION: 1. Technically successful five [place] double lumen power injectable PICC placement Electronically Signed   By: Lucrezia Europe M.D.   On: 05/23/2015 16:40   Ir US Guide Vasc Access Right  05/23/2015  CLINICAL DATA:  Diabetic ketoacidosis, needs venous access for IV therapies EXAM: PICC PLACEMENT WITH ULTRASOUND AND FLUOROSCOPY FLUOROSCOPY TIME:  0.1 minute, 11 uGym2 DAP TECHNIQUE: After written informed consent was obtained, patient was placed in the supine position on angiographic table. Patency of the right basilic vein was confirmed with ultrasound with image documentation. An appropriate skin site was determined. Skin site was marked. Region was prepped using maximum barrier technique including cap and mask, sterile gown, sterile gloves, large sterile sheet, and Chlorhexidine as cutaneous antisepsis. The region was infiltrated locally with 1% lidocaine. Under real-time ultrasound guidance, the right basilic vein was accessed with a 21 gauge micropuncture needle; the needle tip within the vein was confirmed with ultrasound  image documentation. Needle exchanged over a 018 guidewire for a peel-away sheath, through which a 5-French double-lumen power injectable PICC trimmed to 38cm was advanced, positioned with its tip near the cavoatrial junction. Spot chest radiograph confirms appropriate catheter position. Catheter was flushed per protocol and secured externally. The patient tolerated procedure well. COMPLICATIONS: COMPLICATIONS none IMPRESSION: 1. Technically successful five [place] double lumen power injectable PICC placement Electronically Signed   By: Lucrezia Europe M.D.   On: 05/23/2015 16:40   Dg Chest Port 1 View  05/17/2015  CLINICAL DATA:  Right IJ line placement.  Initial encounter. EXAM: PORTABLE CHEST 1 VIEW COMPARISON:  Chest radiograph performed earlier today at 11:23 a.m. FINDINGS: The patient's right IJ line is noted ending overlying the right atrium. This could be retracted approximately 3 cm to the cavoatrial junction. The lungs are relatively well expanded and appear grossly clear. No focal consolidation, pleural effusion or pneumothorax is seen. The cardiomediastinal silhouette is normal in size. No acute osseous abnormalities are identified. Clips are noted within the right upper quadrant, reflecting prior cholecystectomy. IMPRESSION: 1. Right IJ line noted ending overlying the right atrium. This could be retracted approximately 3 cm to the cavoatrial junction. 2. No acute cardiopulmonary process seen. Electronically Signed   By: Garald Balding M.D.   On: 05/17/2015 21:45   Dg Abd 2 Views  05/17/2015  CLINICAL DATA:  Nausea and vomiting with hyperglycemia EXAM: ABDOMEN - 2 VIEW COMPARISON:  CT abdomen and pelvis May 14, 2015 FINDINGS: Supine and left lateral decubitus images were obtained. There is diffuse stool throughout the colon. No bowel dilatation or air-fluid level suggesting obstruction. No free air. There are surgical clips the right upper quadrant. Visualized lung bases are clear. No abnormal  calcifications. IMPRESSION: No demonstrable obstruction or free air. Diffuse stool throughout colon. Electronically Signed   By: Lowella Grip III M.D.   On: 05/17/2015 11:45   Dg Abd Acute W/chest  05/23/2015  CLINICAL DATA:  49 year old female with chest pain cough and abdominal pain today. Initial encounter. EXAM: DG ABDOMEN ACUTE W/ 1V CHEST COMPARISON:  Chest radiographs 05/17/2015 and earlier. CT Abdomen and Pelvis 05/14/2015 FINDINGS: Semi upright AP view of the chest at 1137 hours. The lungs appear clear. Normal cardiac size and mediastinal contours. Visualized tracheal air column is within normal limits. No pneumothorax or pneumoperitoneum. No pneumoperitoneum on the left-side-down lateral decubitus view of the abdomen. Non obstructed bowel gas pattern, retained stool from the descending colon to the rectum.  Decompressed stomach. Stable cholecystectomy clips. Abdominal and pelvic visceral contours are within normal limits. No acute osseous abnormality identified. IMPRESSION: 1.  Normal bowel gas pattern, no free air. 2.  No acute cardiopulmonary abnormality. Electronically Signed   By: Genevie Ann M.D.   On: 05/23/2015 12:16    Microbiology: Recent Results (from the past 240 hour(s))  Urine culture     Status: None   Collection Time: 05/17/15 11:18 AM  Result Value Ref Range Status   Specimen Description URINE, CLEAN CATCH  Final   Special Requests NONE  Final   Culture   Final    MULTIPLE SPECIES PRESENT, SUGGEST RECOLLECTION Performed at [facility]    Report Status 05/18/2015 FINAL  Final  MRSA PCR Screening     Status: None   Collection Time: 05/17/15 12:23 PM  Result Value Ref Range Status   MRSA by PCR NEGATIVE NEGATIVE Final    Comment:        The GeneXpert MRSA Assay (FDA approved for NASAL specimens only), is one component of a comprehensive MRSA colonization surveillance program. It is not intended to diagnose MRSA infection nor to guide or monitor treatment  for MRSA infections.   MRSA PCR Screening     Status: None   Collection Time: 05/23/15  7:35 AM  Result Value Ref Range Status   MRSA by PCR NEGATIVE NEGATIVE Final    Comment:        The GeneXpert MRSA Assay (FDA approved for NASAL specimens only), is one component of a comprehensive MRSA colonization surveillance program. It is not intended to diagnose MRSA infection nor to guide or monitor treatment for MRSA infections.      Labs: Basic Metabolic Panel:  Recent Labs Lab 05/17/15 1133  05/18/15 0330  05/19/15 0515 05/23/15 0449 05/23/15 1450 05/23/15 1611 05/24/15 0600  NA 137  < > 140  < > 132* 134* 138 129* 137  K 4.4  < > 3.7  < > 3.4* 3.3* 3.7 4.3 3.1*  CL 106  < > 115*  < > 109 93* 104 93* 105  CO2 12*  < > 16*  < > 19* 16* 20* 33* 27  GLUCOSE 469*  < > 216*  < > 64* 494* 170* 139* 47*  BUN 16  < > 11  < > 6 14 12 12 11   CREATININE 1.24*  < > 1.08*  < > 0.95 1.06* 0.93 0.37* 0.93  CALCIUM 8.5*  < > 8.0*  < > 7.7* 9.4 8.5* 8.7* 8.7*  MG 1.6*  --  2.2  --   --   --  1.4*  --   --   PHOS 4.1  --   --   --   --   --  2.6  --   --   < > = values in this interval not displayed. Liver Function Tests:  Recent Labs Lab 05/23/15 0449  AST 24  ALT 21  ALKPHOS 92  BILITOT 1.6*  PROT 7.9  ALBUMIN 4.0    Recent Labs Lab 05/23/15 0449  LIPASE 43   No results for input(s): AMMONIA in the last 168 hours. CBC:  Recent Labs Lab 05/18/15 0330 05/23/15 0449  WBC 10.1 14.7*  NEUTROABS  --  13.3*  HGB 8.3* 10.6*  HCT 25.9* 32.0*  MCV 88.4 84.9  PLT 363 387   Cardiac Enzymes:  Recent Labs Lab 05/17/15 2342 05/18/15 0330 05/18/15 1715 05/18/15 2320 05/19/15 0515  TROPONINI 0.30*  0.43* 0.48* 0.44* 0.47*   BNP: BNP (last 3 results) No results for input(s): BNP in the last 8760 hours.  ProBNP (last 3 results) No results for input(s): PROBNP in the last 8760 hours.  CBG:  Recent Labs Lab 05/23/15 2031 05/23/15 2152 05/24/15 0736  05/24/15 0738 05/24/15 0816  GLUCAP 132* 116* 35* 39* 138*     Signed:  CHIU, STEPHEN K  [facility] 05/24/2015, 10:38 AM

## 2015-05-24 NOTE — Progress Notes (Signed)
[id number] Davis,RN,BSN,CCM: Spoke at length to patient concerning need to obtain endocrinologist to better control her diabetes.  Given the customer service number at [facility] to call for md in her network/has gi md at [email or web address] in [place].

## 2015-05-25 LAB — GLUCOSE, CAPILLARY: GLUCOSE-CAPILLARY: 35 mg/dL — AB (ref 65–99)

## 2015-05-26 ENCOUNTER — Inpatient Hospital Stay (HOSPITAL_COMMUNITY)
Admission: EM | Admit: 2015-05-26 | Discharge: 2015-06-05 | DRG: 637 | Disposition: A | Discharge status: Skilled Nursing Facility | Payer: 59 | Attending: Family Medicine | Admitting: Family Medicine

## 2015-05-26 ENCOUNTER — Emergency Department (HOSPITAL_COMMUNITY): Payer: 59

## 2015-05-26 ENCOUNTER — Encounter (HOSPITAL_COMMUNITY): Payer: Self-pay

## 2015-05-26 DIAGNOSIS — Z9049 Acquired absence of other specified parts of digestive tract: Secondary | ICD-10-CM

## 2015-05-26 DIAGNOSIS — K3184 Gastroparesis: Secondary | ICD-10-CM | POA: Diagnosis present

## 2015-05-26 DIAGNOSIS — E876 Hypokalemia: Secondary | ICD-10-CM | POA: Diagnosis present

## 2015-05-26 DIAGNOSIS — E43 Unspecified severe protein-calorie malnutrition: Secondary | ICD-10-CM | POA: Diagnosis present

## 2015-05-26 DIAGNOSIS — R1110 Vomiting, unspecified: Secondary | ICD-10-CM

## 2015-05-26 DIAGNOSIS — Z6820 Body mass index (BMI) 20.0-20.9, adult: Secondary | ICD-10-CM

## 2015-05-26 DIAGNOSIS — E860 Dehydration: Secondary | ICD-10-CM | POA: Diagnosis present

## 2015-05-26 DIAGNOSIS — N319 Neuromuscular dysfunction of bladder, unspecified: Secondary | ICD-10-CM | POA: Diagnosis present

## 2015-05-26 DIAGNOSIS — Z888 Allergy status to other drugs, medicaments and biological substances status: Secondary | ICD-10-CM

## 2015-05-26 DIAGNOSIS — F32A Depression, unspecified: Secondary | ICD-10-CM | POA: Diagnosis present

## 2015-05-26 DIAGNOSIS — N1330 Unspecified hydronephrosis: Secondary | ICD-10-CM | POA: Diagnosis present

## 2015-05-26 DIAGNOSIS — E10319 Type 1 diabetes mellitus with unspecified diabetic retinopathy without macular edema: Secondary | ICD-10-CM | POA: Diagnosis present

## 2015-05-26 DIAGNOSIS — I10 Essential (primary) hypertension: Secondary | ICD-10-CM | POA: Diagnosis present

## 2015-05-26 DIAGNOSIS — E1043 Type 1 diabetes mellitus with diabetic autonomic (poly)neuropathy: Secondary | ICD-10-CM | POA: Diagnosis present

## 2015-05-26 DIAGNOSIS — Z8249 Family history of ischemic heart disease and other diseases of the circulatory system: Secondary | ICD-10-CM

## 2015-05-26 DIAGNOSIS — D649 Anemia, unspecified: Secondary | ICD-10-CM | POA: Diagnosis present

## 2015-05-26 DIAGNOSIS — E1010 Type 1 diabetes mellitus with ketoacidosis without coma: Secondary | ICD-10-CM | POA: Diagnosis not present

## 2015-05-26 DIAGNOSIS — F329 Major depressive disorder, single episode, unspecified: Secondary | ICD-10-CM | POA: Diagnosis present

## 2015-05-26 DIAGNOSIS — Z794 Long term (current) use of insulin: Secondary | ICD-10-CM

## 2015-05-26 DIAGNOSIS — Z833 Family history of diabetes mellitus: Secondary | ICD-10-CM

## 2015-05-26 DIAGNOSIS — F419 Anxiety disorder, unspecified: Secondary | ICD-10-CM | POA: Diagnosis present

## 2015-05-26 DIAGNOSIS — R112 Nausea with vomiting, unspecified: Secondary | ICD-10-CM | POA: Diagnosis present

## 2015-05-26 DIAGNOSIS — Z79899 Other long term (current) drug therapy: Secondary | ICD-10-CM

## 2015-05-26 DIAGNOSIS — R109 Unspecified abdominal pain: Secondary | ICD-10-CM

## 2015-05-26 DIAGNOSIS — E10649 Type 1 diabetes mellitus with hypoglycemia without coma: Secondary | ICD-10-CM | POA: Diagnosis present

## 2015-05-26 LAB — I-STAT CG4 LACTIC ACID, ED: LACTIC ACID, VENOUS: 2.21 mmol/L — AB (ref 0.5–2.0)

## 2015-05-26 LAB — COMPREHENSIVE METABOLIC PANEL
ALT: 25 U/L (ref 14–54)
ANION GAP: 20 — AB (ref 5–15)
AST: 25 U/L (ref 15–41)
Albumin: 4.1 g/dL (ref 3.5–5.0)
Alkaline Phosphatase: 95 U/L (ref 38–126)
BUN: 11 mg/dL (ref 6–20)
CHLORIDE: 96 mmol/L — AB (ref 101–111)
CO2: 19 mmol/L — AB (ref 22–32)
Calcium: 9.4 mg/dL (ref 8.9–10.3)
Creatinine, Ser: 1.02 mg/dL — ABNORMAL HIGH (ref 0.44–1.00)
Glucose, Bld: 489 mg/dL — ABNORMAL HIGH (ref 65–99)
POTASSIUM: 3.7 mmol/L (ref 3.5–5.1)
SODIUM: 135 mmol/L (ref 135–145)
Total Bilirubin: 1.5 mg/dL — ABNORMAL HIGH (ref 0.3–1.2)
Total Protein: 7.6 g/dL (ref 6.5–8.1)

## 2015-05-26 LAB — URINALYSIS, ROUTINE W REFLEX MICROSCOPIC
Bilirubin Urine: NEGATIVE
Glucose, UA: >1000 mg/dL — AB
Ketones, ur: >80 mg/dL — AB
Leukocytes, UA: NEGATIVE
NITRITE: NEGATIVE
Protein, ur: 30 mg/dL — AB
SPECIFIC GRAVITY, URINE: 1.019 (ref 1.005–1.030)
pH: 6.5 (ref 5.0–8.0)

## 2015-05-26 LAB — CBC
HCT: 32.3 % — ABNORMAL LOW (ref 36.0–46.0)
HEMOGLOBIN: 10.5 g/dL — AB (ref 12.0–15.0)
MCH: 27.6 pg (ref 26.0–34.0)
MCHC: 32.5 g/dL (ref 30.0–36.0)
MCV: 84.8 fL (ref 78.0–100.0)
Platelets: 382 10*3/uL (ref 150–400)
RBC: 3.81 MIL/uL — AB (ref 3.87–5.11)
RDW: 15.3 % (ref 11.5–15.5)
WBC: 10.1 10*3/uL (ref 4.0–10.5)

## 2015-05-26 LAB — MAGNESIUM: Magnesium: 1.6 mg/dL — ABNORMAL LOW (ref 1.7–2.4)

## 2015-05-26 LAB — BASIC METABOLIC PANEL
Anion gap: 10 (ref 5–15)
BUN: 11 mg/dL (ref 6–20)
CHLORIDE: 112 mmol/L — AB (ref 101–111)
CO2: 19 mmol/L — ABNORMAL LOW (ref 22–32)
CREATININE: 0.84 mg/dL (ref 0.44–1.00)
Calcium: 7.9 mg/dL — ABNORMAL LOW (ref 8.9–10.3)
GFR calc Af Amer: >60 mL/min (ref >60)
GLUCOSE: 276 mg/dL — AB (ref 65–99)
Potassium: 3.1 mmol/L — ABNORMAL LOW (ref 3.5–5.1)
SODIUM: 141 mmol/L (ref 135–145)

## 2015-05-26 LAB — URINE MICROSCOPIC-ADD ON

## 2015-05-26 LAB — CBG MONITORING, ED
GLUCOSE-CAPILLARY: 199 mg/dL — AB (ref 65–99)
GLUCOSE-CAPILLARY: 343 mg/dL — AB (ref 65–99)
GLUCOSE-CAPILLARY: 454 mg/dL — AB (ref 65–99)

## 2015-05-26 LAB — PHOSPHORUS: PHOSPHORUS: 4.1 mg/dL (ref 2.5–4.6)

## 2015-05-26 LAB — GLUCOSE, CAPILLARY
GLUCOSE-CAPILLARY: 209 mg/dL — AB (ref 65–99)
GLUCOSE-CAPILLARY: 267 mg/dL — AB (ref 65–99)
GLUCOSE-CAPILLARY: 34 mg/dL — AB (ref 65–99)
Glucose-Capillary: 334 mg/dL — ABNORMAL HIGH (ref 65–99)

## 2015-05-26 LAB — MRSA PCR SCREENING: MRSA by PCR: NEGATIVE

## 2015-05-26 MED ORDER — SODIUM CHLORIDE 0.9 % IJ SOLN
10.0000 mL | INTRAMUSCULAR | Status: DC | PRN
  Administered 2015-05-29 – 2015-06-05 (×12): 10 mL
  Filled 2015-05-26 (×12): qty 40

## 2015-05-26 MED ORDER — DEXTROSE-NACL 5-0.45 % IV SOLN
INTRAVENOUS | Status: DC
  Administered 2015-05-27: 01:00:00 via INTRAVENOUS

## 2015-05-26 MED ORDER — ONDANSETRON HCL 4 MG/2ML IJ SOLN
4.0000 mg | Freq: Once | INTRAMUSCULAR | Status: AC
  Administered 2015-05-26: 4 mg via INTRAVENOUS
  Filled 2015-05-26: qty 2

## 2015-05-26 MED ORDER — PROMETHAZINE HCL 25 MG/ML IJ SOLN
12.5000 mg | INTRAMUSCULAR | Status: DC | PRN
  Administered 2015-05-26 – 2015-05-28 (×5): 12.5 mg via INTRAVENOUS
  Filled 2015-05-26 (×5): qty 1

## 2015-05-26 MED ORDER — INSULIN ASPART 100 UNIT/ML ~~LOC~~ SOLN
5.0000 [IU] | Freq: Once | SUBCUTANEOUS | Status: AC
  Administered 2015-05-26: 5 [IU] via SUBCUTANEOUS

## 2015-05-26 MED ORDER — LIDOCAINE HCL 1 % IJ SOLN
INTRAMUSCULAR | Status: AC
  Filled 2015-05-26: qty 20

## 2015-05-26 MED ORDER — SODIUM CHLORIDE 0.9 % IJ SOLN
10.0000 mL | Freq: Two times a day (BID) | INTRAMUSCULAR | Status: DC
  Administered 2015-05-27 – 2015-06-01 (×5): 10 mL

## 2015-05-26 MED ORDER — LORAZEPAM 2 MG/ML IJ SOLN
2.0000 mg | Freq: Once | INTRAMUSCULAR | Status: AC
  Administered 2015-05-26: 2 mg via INTRAVENOUS
  Filled 2015-05-26: qty 1

## 2015-05-26 MED ORDER — HYDROMORPHONE HCL 1 MG/ML IJ SOLN
1.0000 mg | INTRAMUSCULAR | Status: DC | PRN
  Administered 2015-05-26 – 2015-05-30 (×13): 1 mg via INTRAVENOUS
  Filled 2015-05-26 (×15): qty 1

## 2015-05-26 MED ORDER — MAGNESIUM SULFATE 2 GM/50ML IV SOLN
2.0000 g | Freq: Once | INTRAVENOUS | Status: AC
  Administered 2015-05-26: 2 g via INTRAVENOUS
  Filled 2015-05-26: qty 50

## 2015-05-26 MED ORDER — SODIUM CHLORIDE 0.9 % IV SOLN
INTRAVENOUS | Status: DC
  Administered 2015-05-26: 21:00:00 via INTRAVENOUS
  Filled 2015-05-26 (×2): qty 2.5

## 2015-05-26 MED ORDER — SODIUM CHLORIDE 0.9 % IV SOLN
INTRAVENOUS | Status: DC
  Administered 2015-05-26: 22:00:00 via INTRAVENOUS
  Administered 2015-05-27: 125 mL/h via INTRAVENOUS
  Administered 2015-05-28 (×2): via INTRAVENOUS

## 2015-05-26 MED ORDER — SODIUM CHLORIDE 0.9 % IV BOLUS (SEPSIS)
1000.0000 mL | Freq: Once | INTRAVENOUS | Status: AC
  Administered 2015-05-26: 1000 mL via INTRAVENOUS

## 2015-05-26 MED ORDER — PANTOPRAZOLE SODIUM 40 MG IV SOLR
40.0000 mg | Freq: Every day | INTRAVENOUS | Status: DC
  Administered 2015-05-26 – 2015-05-30 (×5): 40 mg via INTRAVENOUS
  Filled 2015-05-26 (×5): qty 40

## 2015-05-26 MED ORDER — POTASSIUM CHLORIDE 10 MEQ/100ML IV SOLN
10.0000 meq | INTRAVENOUS | Status: AC
  Administered 2015-05-26 – 2015-05-27 (×4): 10 meq via INTRAVENOUS
  Filled 2015-05-26 (×4): qty 100

## 2015-05-26 MED ORDER — DEXTROSE-NACL 5-0.45 % IV SOLN: INTRAVENOUS | Status: DC

## 2015-05-26 MED ORDER — SODIUM CHLORIDE 0.9 % IV SOLN
Freq: Once | INTRAVENOUS | Status: AC
  Administered 2015-05-26: 19:00:00 via INTRAVENOUS

## 2015-05-26 MED ORDER — ONDANSETRON HCL 4 MG/2ML IJ SOLN
8.0000 mg | Freq: Once | INTRAMUSCULAR | Status: AC
  Administered 2015-05-26: 8 mg via INTRAMUSCULAR
  Filled 2015-05-26: qty 4

## 2015-05-26 MED ORDER — DIPHENHYDRAMINE HCL 50 MG/ML IJ SOLN
25.0000 mg | Freq: Once | INTRAMUSCULAR | Status: AC
  Administered 2015-05-26: 25 mg via INTRAVENOUS
  Filled 2015-05-26: qty 1

## 2015-05-26 MED ORDER — SODIUM CHLORIDE 0.9 % IV BOLUS (SEPSIS): 1000.0000 mL | Freq: Once | INTRAVENOUS | Status: DC

## 2015-05-26 MED ORDER — SODIUM CHLORIDE 0.9 % IV SOLN: INTRAVENOUS | Status: AC

## 2015-05-26 MED ORDER — CHLOROPROCAINE HCL 1 % IJ SOLN
INTRAMUSCULAR | Status: AC
  Filled 2015-05-26: qty 30

## 2015-05-26 NOTE — ED Notes (Signed)
Patient in IR

## 2015-05-26 NOTE — ED Notes (Addendum)
IV team called and said patient would need to go to IR due to difficulty stick.  Informed Dr. Jeneen Rinks and he stated he would contact IR

## 2015-05-26 NOTE — ED Notes (Signed)
[name], EDP made aware of patient CG4 lactic result.

## 2015-05-26 NOTE — ED Notes (Signed)
Per EMS- Patient has a history of gastroparesis. Patient has been vomiting and unable to take medications

## 2015-05-26 NOTE — ED Notes (Signed)
IV team called and stated they could not insert PICC line on patient due to scar tissue.  Order has been placed for IR Fluoro

## 2015-05-26 NOTE — ED Provider Notes (Signed)
CSN: [id number]     Arrival date & time 05/26/15  1355 History   First MD Initiated Contact with Patient 05/26/15 1503     No chief complaint on file.     HPI  Patient presents for evaluation of abdominal pain nausea vomiting and high blood sugar. Has a history of gastroparesis. This recent several admissions for same. Discharged for less than 48 hours ago. States her first 48 hours went well. Symptoms recurred last night. Vomiting and pain through the night. Last took insulin this morning.  Past Medical History  Diagnosis Date  . Diabetes mellitus without complication ([place])   . Gastroparesis    Past Surgical History  Procedure Laterality Date  . Retinal detachment surgery    . Posterior vitrectomy and membrane peel-left eye  09/28/2002  . Posterior vitrectomy and membrane peel-right eye  03/16/2002  . Eye surgery    . Gailstones    . Esophagogastroduodenoscopy  09/27/2014    at [facility], Dr Rolan Lipa. biospy neg for celiac, neg for H pylori.   . Colonoscopy  09/27/2014    at [facility]   Family History  Problem Relation Age of Onset  . Cystic fibrosis Mother   . Hypertension Father   . Diabetes Brother   . Hypertension Maternal Grandmother    Social History  Substance Use Topics  . Smoking status: Never Smoker   . Smokeless tobacco: Never Used  . Alcohol Use: No   OB History    No data available     Review of Systems  Unable to perform ROS: Other (near constant retching and vomiting. Patient unable to answer questioning. Other than simple yes and no shaking or nodding of the head)      Allergies  Anesthetics, amide; Penicillins; Buprenorphine hcl; Encainide; and Metoclopramide  Home Medications   Prior to Admission medications   Medication Sig Start Date End Date Taking? Authorizing Provider  ALPRAZolam (XANAX) 0.25 MG tablet Take 1 tablet (0.25 mg total) by mouth 3 (three) times daily as needed for anxiety. Patient taking differently: Take 0.25 mg by mouth at  bedtime as needed for sleep.  05/20/15  Yes Robbie Lis, MD  dronabinol (MARINOL) 10 MG capsule Take 10 mg by mouth 2 (two) times daily. 02/04/15  Yes Historical Provider, MD  ferrous sulfate 325 (65 FE) MG tablet Take 325 mg by mouth 3 (three) times a week.    Yes Historical Provider, MD  FLUoxetine (PROZAC) 40 MG capsule Take 40 mg by mouth daily.   Yes Historical Provider, MD  furosemide (LASIX) 40 MG tablet Take 40 mg by mouth daily.   Yes Historical Provider, MD  insulin aspart (NOVOLOG) 100 UNIT/ML injection Inject 0-15 Units into the skin 3 (three) times daily with meals. 6-15 units three times daily with food.  Per sliding scale Patient taking differently: Inject 0-15 Units into the skin 3 (three) times daily with meals. 2-15 units three times daily with food. Per sliding scale 03/12/15  Yes Belkys A Regalado, MD  insulin detemir (LEVEMIR) 100 UNIT/ML injection Inject 0.15 mLs (15 Units total) into the skin at bedtime. 02/17/13  Yes Robbie Lis, MD  lisinopril (PRINIVIL,ZESTRIL) 10 MG tablet Take 10 mg by mouth daily. 10/13/14  Yes Historical Provider, MD  ondansetron (ZOFRAN ODT) 4 MG disintegrating tablet Take 1 tablet (4 mg total) by mouth every 8 (eight) hours as needed for nausea. 05/20/15  Yes Robbie Lis, MD  pantoprazole (PROTONIX) 40 MG tablet Take 40 mg  by mouth daily.  01/04/15  Yes Historical Provider, MD  pregabalin (LYRICA) 150 MG capsule Take 1 capsule (150 mg total) by mouth 2 (two) times daily. 02/17/13  Yes Robbie Lis, MD  promethazine (PHENERGAN) 25 MG suppository Place 1 suppository (25 mg total) rectally every 6 (six) hours as needed for nausea or vomiting. 04/27/15  Yes Samantha Tripp Dowless, PA-C  promethazine (PHENERGAN) 25 MG tablet Take 1 tablet (25 mg total) by mouth every 6 (six) hours as needed for nausea or vomiting. 04/27/15  Yes Samantha Tripp Dowless, PA-C  potassium chloride (K-DUR) 10 MEQ tablet Take 1 tablet (10 mEq total) by mouth daily. 04/27/15    Samantha Tripp Dowless, PA-C   BP 163/70 mmHg  Pulse 111  Resp 18  SpO2 96%  LMP 05/10/2015 Physical Exam  Constitutional: She is oriented to person, place, and time. She has a sickly appearance. She appears ill. She appears distressed.  HENT:  Head: Normocephalic.  Eyes: Conjunctivae are normal. Pupils are equal, round, and reactive to light. No scleral icterus.  Neck: Normal range of motion. Neck supple. No thyromegaly present.  Cardiovascular: Normal rate and regular rhythm.  Exam reveals no gallop and no friction rub.   No murmur heard. Pulmonary/Chest: Effort normal and breath sounds normal. No respiratory distress. She has no wheezes. She has no rales.  Abdominal: Soft. Bowel sounds are normal. She exhibits no distension. There is no tenderness. There is no rebound.  Soft scaphoid abdomen. Not distended. Complains of diffuse pain and tenderness. Present bowel sounds.  Musculoskeletal: Normal range of motion.  Neurological: She is oriented to person, place, and time.  Awake and mentating well.  Skin: Skin is warm and dry. No rash noted.  Psychiatric: She has a normal mood and affect. Her behavior is normal.    ED Course  Procedures (including critical care time) Labs Review Labs Reviewed  CBC - Abnormal; Notable for the following:    RBC 3.81 (*)    Hemoglobin 10.5 (*)    HCT 32.3 (*)    All other components within normal limits  URINALYSIS, ROUTINE W REFLEX MICROSCOPIC (NOT AT [facility]) - Abnormal; Notable for the following:    Glucose, UA >1000 (*)    Hgb urine dipstick TRACE (*)    Ketones, ur >80 (*)    Protein, ur 30 (*)    All other components within normal limits  COMPREHENSIVE METABOLIC PANEL - Abnormal; Notable for the following:    Chloride 96 (*)    CO2 19 (*)    Glucose, Bld 489 (*)    Creatinine, Ser 1.02 (*)    Total Bilirubin 1.5 (*)    Anion gap 20 (*)    All other components within normal limits  URINE MICROSCOPIC-ADD ON - Abnormal; Notable for the  following:    Squamous Epithelial / LPF 6-30 (*)    Bacteria, UA RARE (*)    All other components within normal limits  CBG MONITORING, ED - Abnormal; Notable for the following:    Glucose-Capillary 199 (*)    All other components within normal limits  CBG MONITORING, ED - Abnormal; Notable for the following:    Glucose-Capillary 343 (*)    All other components within normal limits  I-STAT CG4 LACTIC ACID, ED - Abnormal; Notable for the following:    Lactic Acid, Venous 2.21 (*)    All other components within normal limits  PYRUVIC ACID  MAGNESIUM  PHOSPHORUS  CBG MONITORING, ED  Imaging Review No results found. I have personally reviewed and evaluated these images and lab results as part of my medical decision-making.   EKG Interpretation None      MDM   Final diagnoses:  Vomiting  Diabetic ketoacidosis without coma associated with type 1 diabetes mellitus ([place])    Patient with elevated anion gap and acidosis. Quite difficult IV access. Nurse's 2 unable to obtain IV. I was unable to with ultrasound guidance. Ultrasound team was unable to obtain access. Patient went to interventional radiology where PICC line was able to be placed. Given IV fluids. Placed on glucostabilizer. Discussed with Dr. Olevia Bowens, will be admitted.  CRITICAL CARE Performed by: Tanna Furry JOSEPH   Total critical care time: 45 minutes  Critical care time was exclusive of separately billable procedures and treating other patients.  Critical care was necessary to treat or prevent imminent or life-threatening deterioration.  Critical care was time spent personally by me on the following activities: development of treatment plan with patient and/or surrogate as well as nursing, discussions with consultants, evaluation of patient's response to treatment, examination of patient, obtaining history from patient or surrogate, ordering and performing treatments and interventions, ordering and review of  laboratory studies, ordering and review of radiographic studies, pulse oximetry and re-evaluation of patient's condition.     Tanna Furry, MD 05/26/15 2015

## 2015-05-26 NOTE — ED Notes (Signed)
Purple top blood tubes filled with 4 mls each place on ice and sent to lab

## 2015-05-26 NOTE — Procedures (Signed)
Placement of right brachial PICC.  Tip in lower SVC and ready to use.  No immediate complication and minimal blood loss.

## 2015-05-26 NOTE — H&P (Signed)
[facility] History and Physical  [name] [id number] DOB: Oct 01, 1965 DOA: 05/26/2015  Referring physician: Tanna Furry, M.D. PCP: Barbette Merino, MD   Chief Complaint: nausea and vomiting.  HPI: [name] is a 49 y.o. female with a past medical history of type 1 diabetes,multiple admissions for DKA and gastroparesis in the past, most recently discharge 2 days ago, diabetic retinopathy, diabetic peripheral neuropathy who returns to the emergency department due to numerous episodes of nausea and emesis associated with abdominal pain since yesterday evening. She denies fever, chills, productive cough, diarrhea or urinary symptoms. She is states that she has been using her insulin as prescribed. The patient is currently a poor historian at this time due to the acuity of her symptoms.   Review of Systems:  Unable to fully obtain due to acuity of symptoms.  Past Medical History  Diagnosis Date  . Diabetes mellitus without complication ([place])   . Gastroparesis    Past Surgical History  Procedure Laterality Date  . Retinal detachment surgery    . Posterior vitrectomy and membrane peel-left eye  09/28/2002  . Posterior vitrectomy and membrane peel-right eye  03/16/2002  . Eye surgery    . Gailstones    . Esophagogastroduodenoscopy  09/27/2014    at [facility], Dr Rolan Lipa. biospy neg for celiac, neg for H pylori.   . Colonoscopy  09/27/2014    at [facility]   Social History:  reports that she has never smoked. She has never used smokeless tobacco. She reports that she does not drink alcohol or use illicit drugs.  Allergies  Allergen Reactions  . Anesthetics, Amide Nausea And Vomiting  . Penicillins Diarrhea and Nausea And Vomiting    Has patient had a PCN reaction causing immediate rash, facial/tongue/throat swelling, SOB or lightheadedness with hypotension: No Has patient had a PCN reaction causing severe rash involving mucus membranes or skin necrosis: No Has patient had a  PCN reaction that required hospitalization No Has patient had a PCN reaction occurring within the last 10 years: Yes  If all of the above answers are "NO", then may proceed with Cephalosporin use.   . Buprenorphine Hcl Rash  . Encainide Nausea And Vomiting  . Metoclopramide Rash    Family History  Problem Relation Age of Onset  . Cystic fibrosis Mother   . Hypertension Father   . Diabetes Brother   . Hypertension Maternal Grandmother     Prior to Admission medications   Medication Sig Start Date End Date Taking? Authorizing Provider  ALPRAZolam (XANAX) 0.25 MG tablet Take 1 tablet (0.25 mg total) by mouth 3 (three) times daily as needed for anxiety. Patient taking differently: Take 0.25 mg by mouth at bedtime as needed for sleep.  05/20/15  Yes Robbie Lis, MD  dronabinol (MARINOL) 10 MG capsule Take 10 mg by mouth 2 (two) times daily. 02/04/15  Yes Historical Provider, MD  ferrous sulfate 325 (65 FE) MG tablet Take 325 mg by mouth 3 (three) times a week.    Yes Historical Provider, MD  FLUoxetine (PROZAC) 40 MG capsule Take 40 mg by mouth daily.   Yes Historical Provider, MD  furosemide (LASIX) 40 MG tablet Take 40 mg by mouth daily.   Yes Historical Provider, MD  insulin aspart (NOVOLOG) 100 UNIT/ML injection Inject 0-15 Units into the skin 3 (three) times daily with meals. 6-15 units three times daily with food.  Per sliding scale Patient taking differently: Inject 0-15 Units into the skin 3 (three) times daily  with meals. 2-15 units three times daily with food. Per sliding scale 03/12/15  Yes Belkys A Regalado, MD  insulin detemir (LEVEMIR) 100 UNIT/ML injection Inject 0.15 mLs (15 Units total) into the skin at bedtime. 02/17/13  Yes Robbie Lis, MD  lisinopril (PRINIVIL,ZESTRIL) 10 MG tablet Take 10 mg by mouth daily. 10/13/14  Yes Historical Provider, MD  ondansetron (ZOFRAN ODT) 4 MG disintegrating tablet Take 1 tablet (4 mg total) by mouth every 8 (eight) hours as needed for  nausea. 05/20/15  Yes Robbie Lis, MD  pantoprazole (PROTONIX) 40 MG tablet Take 40 mg by mouth daily.  01/04/15  Yes Historical Provider, MD  pregabalin (LYRICA) 150 MG capsule Take 1 capsule (150 mg total) by mouth 2 (two) times daily. 02/17/13  Yes Robbie Lis, MD  promethazine (PHENERGAN) 25 MG suppository Place 1 suppository (25 mg total) rectally every 6 (six) hours as needed for nausea or vomiting. 04/27/15  Yes Samantha Tripp Dowless, PA-C  promethazine (PHENERGAN) 25 MG tablet Take 1 tablet (25 mg total) by mouth every 6 (six) hours as needed for nausea or vomiting. 04/27/15  Yes Samantha Tripp Dowless, PA-C  potassium chloride (K-DUR) 10 MEQ tablet Take 1 tablet (10 mEq total) by mouth daily. 04/27/15   Carlos Levering, PA-C   Physical Exam: Filed Vitals:   05/26/15 1716 05/26/15 2022  BP: 163/70 164/78  Pulse: 111 102  Resp: 18 20  SpO2: 96% 100%    Wt Readings from Last 3 Encounters:  05/24/15 60.5 kg (133 lb 6.1 oz)  05/17/15 56 kg (123 lb 7.3 oz)  05/14/15 65.772 kg (145 lb)    General:  Appears acutely ill. Eyes: PERRL, normal lids, irises & conjunctiva ENT: grossly normal hearing, lips and oral mucosa are very dry. Neck: no LAD, masses or thyromegaly Cardiovascular: Tachycardic at 112 bpm, no m/r/g. No LE edema. Telemetry: Sinus tachycardia at 108 bpm Respiratory: CTA bilaterally, no w/r/r. Normal respiratory effort. Abdomen: Bowel sounds positive, soft, positive epigastric tenderness, no guarding no rebound tenderness. Skin: no rash or induration seen on limited exam Musculoskeletal: grossly normal tone BUE/BLE Psychiatric: Anxious due to acuity of symptoms. Neurologic: grossly non-focal.          Labs on Admission:  Basic Metabolic Panel:  Recent Labs Lab 05/23/15 0449 05/23/15 1450 05/23/15 1611 05/24/15 0600 05/26/15 1825 05/26/15 1952  NA 134* 138 129* 137 135  --   K 3.3* 3.7 4.3 3.1* 3.7  --   CL 93* 104 93* 105 96*  --   CO2 16* 20* 33*  27 19*  --   GLUCOSE 494* 170* 139* 47* 489*  --   BUN 14 12 12 11 11   --   CREATININE 1.06* 0.93 0.37* 0.93 1.02*  --   CALCIUM 9.4 8.5* 8.7* 8.7* 9.4  --   MG  --  1.4*  --   --   --  1.6*  PHOS  --  2.6  --   --   --  4.1   Liver Function Tests:  Recent Labs Lab 05/23/15 0449 05/26/15 1825  AST 24 25  ALT 21 25  ALKPHOS 92 95  BILITOT 1.6* 1.5*  PROT 7.9 7.6  ALBUMIN 4.0 4.1    Recent Labs Lab 05/23/15 0449  LIPASE 43   CBC:  Recent Labs Lab 05/23/15 0449 05/26/15 1825  WBC 14.7* 10.1  NEUTROABS 13.3*  --   HGB 10.6* 10.5*  HCT 32.0* 32.3*  MCV 84.9 84.8  PLT 387 382    CBG:  Recent Labs Lab 05/24/15 0816 05/24/15 1208 05/26/15 1421 05/26/15 1635 05/26/15 2031  GLUCAP 138* 136* 199* 343* 454*      Assessment/Plan Principal Problem:   Diabetic ketoacidosis without coma associated with type 1 diabetes mellitus (HCC) Continue rehydration with IV fluids. Continue insulin infusion. Monitor CBG, electrolytes, anion gap and renal function closely. Resume SQ insulin once the patient's symptoms subsided and anion gap closes.  Active Problems:   Gastroparesis due to DM (HCC)   Nausea & vomiting Continue antiemetic medications. Continue IV hydration. Replace potassium as needed.      Anxiety and depression Resume home meds once the patient is tolerating by mouth.    Anemia Monitor hematocrit and hemoglobin periodically.    Hypomagnesemia Replacement ordered.     Code Status: full code. DVT Prophylaxis:mechanical with SCDs Family Communication:  Disposition Plan: admit to stepdown for IV insulin, IV fluids and electrolytes monitoring/replacement.  Time spent: over 90 minutes were spent in the process of his admission.  Reubin Milan [facility] Pager [phone]

## 2015-05-27 DIAGNOSIS — G43A1 Cyclical vomiting, intractable: Secondary | ICD-10-CM | POA: Diagnosis not present

## 2015-05-27 DIAGNOSIS — E108 Type 1 diabetes mellitus with unspecified complications: Secondary | ICD-10-CM | POA: Diagnosis not present

## 2015-05-27 DIAGNOSIS — F418 Other specified anxiety disorders: Secondary | ICD-10-CM | POA: Diagnosis not present

## 2015-05-27 DIAGNOSIS — E1043 Type 1 diabetes mellitus with diabetic autonomic (poly)neuropathy: Secondary | ICD-10-CM | POA: Diagnosis not present

## 2015-05-27 DIAGNOSIS — K3184 Gastroparesis: Secondary | ICD-10-CM | POA: Diagnosis not present

## 2015-05-27 DIAGNOSIS — E1143 Type 2 diabetes mellitus with diabetic autonomic (poly)neuropathy: Secondary | ICD-10-CM | POA: Diagnosis not present

## 2015-05-27 DIAGNOSIS — E876 Hypokalemia: Secondary | ICD-10-CM | POA: Diagnosis not present

## 2015-05-27 DIAGNOSIS — E860 Dehydration: Secondary | ICD-10-CM | POA: Diagnosis not present

## 2015-05-27 DIAGNOSIS — E1010 Type 1 diabetes mellitus with ketoacidosis without coma: Secondary | ICD-10-CM | POA: Diagnosis not present

## 2015-05-27 LAB — CBC WITH DIFFERENTIAL/PLATELET
BASOS ABS: 0 10*3/uL (ref 0.0–0.1)
BASOS PCT: 0 %
Eosinophils Absolute: 0 10*3/uL (ref 0.0–0.7)
Eosinophils Relative: 0 %
HEMATOCRIT: 29.8 % — AB (ref 36.0–46.0)
HEMOGLOBIN: 9.8 g/dL — AB (ref 12.0–15.0)
LYMPHS PCT: 9 %
Lymphs Abs: 1.2 10*3/uL (ref 0.7–4.0)
MCH: 27.1 pg (ref 26.0–34.0)
MCHC: 32.9 g/dL (ref 30.0–36.0)
MCV: 82.5 fL (ref 78.0–100.0)
MONO ABS: 1.2 10*3/uL — AB (ref 0.1–1.0)
Monocytes Relative: 9 %
NEUTROS ABS: 10.9 10*3/uL — AB (ref 1.7–7.7)
NEUTROS PCT: 82 %
Platelets: 373 10*3/uL (ref 150–400)
RBC: 3.61 MIL/uL — AB (ref 3.87–5.11)
RDW: 15.4 % (ref 11.5–15.5)
WBC: 13.3 10*3/uL — AB (ref 4.0–10.5)

## 2015-05-27 LAB — GLUCOSE, CAPILLARY
GLUCOSE-CAPILLARY: 133 mg/dL — AB (ref 65–99)
GLUCOSE-CAPILLARY: 152 mg/dL — AB (ref 65–99)
GLUCOSE-CAPILLARY: 163 mg/dL — AB (ref 65–99)
GLUCOSE-CAPILLARY: 188 mg/dL — AB (ref 65–99)
GLUCOSE-CAPILLARY: 258 mg/dL — AB (ref 65–99)
GLUCOSE-CAPILLARY: 312 mg/dL — AB (ref 65–99)
Glucose-Capillary: 125 mg/dL — ABNORMAL HIGH (ref 65–99)
Glucose-Capillary: 125 mg/dL — ABNORMAL HIGH (ref 65–99)
Glucose-Capillary: 147 mg/dL — ABNORMAL HIGH (ref 65–99)
Glucose-Capillary: 155 mg/dL — ABNORMAL HIGH (ref 65–99)
Glucose-Capillary: 149 mg/dL — ABNORMAL HIGH (ref 65–99)
Glucose-Capillary: 80 mg/dL (ref 65–99)
Glucose-Capillary: 231 mg/dL — ABNORMAL HIGH (ref 65–99)
Glucose-Capillary: 162 mg/dL — ABNORMAL HIGH (ref 65–99)

## 2015-05-27 LAB — BASIC METABOLIC PANEL
ANION GAP: 10 (ref 5–15)
Anion gap: 5 (ref 5–15)
BUN: 10 mg/dL (ref 6–20)
BUN: 8 mg/dL (ref 6–20)
CALCIUM: 8.4 mg/dL — AB (ref 8.9–10.3)
CALCIUM: 8.8 mg/dL — AB (ref 8.9–10.3)
CHLORIDE: 107 mmol/L (ref 101–111)
CHLORIDE: 106 mmol/L (ref 101–111)
CO2: 25 mmol/L (ref 22–32)
CO2: 23 mmol/L (ref 22–32)
CREATININE: 0.76 mg/dL (ref 0.44–1.00)
Creatinine, Ser: 0.73 mg/dL (ref 0.44–1.00)
GFR calc Af Amer: >60 mL/min (ref >60)
GFR calc non Af Amer: >60 mL/min (ref >60)
GLUCOSE: 156 mg/dL — AB (ref 65–99)
GLUCOSE: 174 mg/dL — AB (ref 65–99)
Potassium: 3.6 mmol/L (ref 3.5–5.1)
Potassium: 3.7 mmol/L (ref 3.5–5.1)
Sodium: 137 mmol/L (ref 135–145)
Sodium: 139 mmol/L (ref 135–145)

## 2015-05-27 LAB — PHOSPHORUS
PHOSPHORUS: 2.1 mg/dL — AB (ref 2.5–4.6)
Phosphorus: 1.7 mg/dL — ABNORMAL LOW (ref 2.5–4.6)

## 2015-05-27 MED ORDER — HYDRALAZINE HCL 20 MG/ML IJ SOLN
10.0000 mg | INTRAMUSCULAR | Status: DC | PRN
  Administered 2015-05-27 – 2015-05-29 (×4): 10 mg via INTRAVENOUS
  Filled 2015-05-27 (×5): qty 1

## 2015-05-27 MED ORDER — INSULIN ASPART 100 UNIT/ML ~~LOC~~ SOLN: 0.0000 [IU] | SUBCUTANEOUS | Status: DC

## 2015-05-27 MED ORDER — INSULIN GLARGINE 100 UNIT/ML ~~LOC~~ SOLN
10.0000 [IU] | Freq: Every day | SUBCUTANEOUS | Status: DC
  Administered 2015-05-27 – 2015-05-28 (×2): 10 [IU] via SUBCUTANEOUS
  Filled 2015-05-27 (×2): qty 0.1

## 2015-05-27 MED ORDER — ENOXAPARIN SODIUM 40 MG/0.4ML ~~LOC~~ SOLN
40.0000 mg | SUBCUTANEOUS | Status: DC
  Administered 2015-05-27 – 2015-06-04 (×9): 40 mg via SUBCUTANEOUS
  Filled 2015-05-27 (×9): qty 0.4

## 2015-05-27 MED ORDER — BISACODYL 10 MG RE SUPP: 10.0000 mg | Freq: Every day | RECTAL | Status: DC | PRN

## 2015-05-27 MED ORDER — INSULIN ASPART 100 UNIT/ML ~~LOC~~ SOLN
0.0000 [IU] | Freq: Three times a day (TID) | SUBCUTANEOUS | Status: DC
Start: 2015-05-27 — End: 2015-05-30
  Administered 2015-05-27: 2 [IU] via SUBCUTANEOUS
  Administered 2015-05-27: 11 [IU] via SUBCUTANEOUS
  Administered 2015-05-28: 2 [IU] via SUBCUTANEOUS
  Administered 2015-05-28: 11 [IU] via SUBCUTANEOUS
  Administered 2015-05-29: 3 [IU] via SUBCUTANEOUS
  Administered 2015-05-29: 2 [IU] via SUBCUTANEOUS

## 2015-05-27 MED ORDER — ONDANSETRON HCL 4 MG/2ML IJ SOLN
4.0000 mg | INTRAMUSCULAR | Status: DC | PRN
  Administered 2015-05-27 – 2015-05-28 (×3): 4 mg via INTRAVENOUS
  Filled 2015-05-27 (×4): qty 2

## 2015-05-27 MED ORDER — LORAZEPAM 2 MG/ML IJ SOLN
1.0000 mg | Freq: Four times a day (QID) | INTRAMUSCULAR | Status: DC | PRN
  Administered 2015-05-27 – 2015-05-30 (×4): 1 mg via INTRAVENOUS
  Filled 2015-05-27 (×4): qty 1

## 2015-05-27 NOTE — Care Management Note (Signed)
Case Management Note  Patient Details  Name: Tajha Mcclurkin MRN: [id number] Date of Birth: Nov 10, 1965  Subjective/Objective:          dka/sepsis          Action/Plan:Date: May 27, 2015 Chart reviewed for concurrent status and case management needs. Will continue to follow patient for changes and needs: Velva Harman, RN, BSN, [place]   [phone]   Expected Discharge Date:   (unknown)               Expected Discharge Plan:  Home/Self Care  In-House Referral:  NA  Discharge planning Services  CM Consult  Post Acute Care Choice:  NA Choice offered to:  NA  DME Arranged:    DME Agency:     HH Arranged:    HH Agency:     Status of Service:  In process, will continue to follow  Medicare Important Message Given:    Date Medicare IM Given:    Medicare IM give by:    Date Additional Medicare IM Given:    Additional Medicare Important Message give by:     If discussed at [place] of Stay Meetings, dates discussed:    Additional Comments:  Leeroy Cha, RN 05/27/2015, 10:28 AM

## 2015-05-27 NOTE — H&P (Signed)
[facility] PROGRESS NOTE  [name] [id number] DOB: Sep 08, 1965 DOA: 05/26/2015 PCP: Barbette Merino, MD    brief narrative 49 year old female with uncontrolled type 1 diabetes mellitus with severe gastroparesis (being followed by GI at [facility] with plan on gastric stimulator placement by Dr. Toney Rakes with recurrent hospitalization for the past few months with DKA and severe gastroparesis symptoms. (This is her ninth hospitalization since April for similar issue and 3 times already in fast 10 days). Patient discharged 3 days back and return to the ED with multiple episodes of nausea and vomiting associated with abdominal pain since the day prior to admission. Patient reports taking her insulin as instructed and being compliant with recommended diet. Patient found to be severely dehydrated and in DKA. Admitted to stepdown and started on aggressive IV hydration and insulin drip.   Assessment/Plan: DKA with uncontrolled type 1 diabetes mellitus and severe recurrent gastroparesis Admitted to stepdown started on IV insulin with aggressive IV hydration. Anion gap closed. Switch to sliding scale insulin and Lantus. A1c of 7.4.   Severe gastroparesis Patient initially followed by [facility] but was referred to [facility] as her symptoms was uncontrollable. Patient has had multiple visits to ED with congestive pleuritic symptoms and multiple hospitalizations as well. Intolerant to Reglan (causes a rash and numbness of her tongue). Continue Phenergan alternating with Zofran. Aggressive IV hydration. Initial electrolytes. -As per GI note at [facility] from September she is being evaluated by Dr. Toney Rakes for placement of a gastric stimulator. A reports that her insurance has recently been activated. As per GI note the procedure is scheduled in ?January. -Continue Protonix. We'll try a course of azithromycin if symptoms persistent.  Anxiety and depression On Xanax at home.  Ordered when necessary IV Ativan. Resume her antidepression once tolerating by mouth  Anemia Stable.  Anemia and hypomagnesemia Replenish  Essential hypertension with elevated blood pressure Ordered when necessary hydralazine.  DVT prophylaxis: Subcutaneous Lovenox Diet: Clear liquid   Code Status: Full code Family Communication: None at bedside Disposition Plan: Continue step down monitoring   Consultants:  None  Procedures:  none  Antibiotics:  None  HPI/Subjective: Patient H&P reviewed. Patient does of nausea but has not had vomiting this morning. Tachycardic.  Objective: Filed Vitals:   05/27/15 1000 05/27/15 1446  BP: 130/56 184/71  Pulse: 97   Temp: 97.7 F (36.5 C)   Resp: 11     Intake/Output Summary (Last 24 hours) at 05/27/15 1524 Last data filed at 05/27/15 1000  Gross per 24 hour  Intake 1602.89 ml  Output   2120 ml  Net -517.11 ml   There were no vitals filed for this visit.  Exam:   General:  Middle aged built female lying in bed appears significantly fatigued  HEENT: No pallor, dry oral mucosa, supple neck  Chest: Clear to auscultation bilaterally  CVS: S1 and S2 tachycardic, no murmurs rub or gallop  GI: Soft, nondistended, epigastric tenderness, bowel sounds present  Musculoskeletal: Warm, no edema  CNS: Alert and oriented  Data Reviewed: Basic Metabolic Panel:  Recent Labs Lab 05/23/15 1450  05/24/15 0600 05/26/15 1825 05/26/15 1952 05/26/15 2252 05/27/15 0355 05/27/15 0650  NA 138  < > 137 135  --  141 137 139  K 3.7  < > 3.1* 3.7  --  3.1* 3.6 3.7  CL 104  < > 105 96*  --  112* 107 106  CO2 20*  < > 27 19*  --  19*  25 23  GLUCOSE 170*  < > 47* 489*  --  276* 156* 174*  BUN 12  < > 11 11  --  11 10 8   CREATININE 0.93  < > 0.93 1.02*  --  0.84 0.76 0.73  CALCIUM 8.5*  < > 8.7* 9.4  --  7.9* 8.4* 8.8*  MG 1.4*  --   --   --  1.6*  --   --   --   PHOS 2.6  --   --   --  4.1  --  1.7* 2.1*  < > = values in  this interval not displayed. Liver Function Tests:  Recent Labs Lab 05/23/15 0449 05/26/15 1825  AST 24 25  ALT 21 25  ALKPHOS 92 95  BILITOT 1.6* 1.5*  PROT 7.9 7.6  ALBUMIN 4.0 4.1    Recent Labs Lab 05/23/15 0449  LIPASE 43   No results for input(s): AMMONIA in the last 168 hours. CBC:  Recent Labs Lab 05/23/15 0449 05/26/15 1825 05/27/15 0650  WBC 14.7* 10.1 13.3*  NEUTROABS 13.3*  --  10.9*  HGB 10.6* 10.5* 9.8*  HCT 32.0* 32.3* 29.8*  MCV 84.9 84.8 82.5  PLT 387 382 373   Cardiac Enzymes: No results for input(s): CKTOTAL, CKMB, CKMBINDEX, TROPONINI in the last 168 hours. BNP (last 3 results) No results for input(s): BNP in the last 8760 hours.  ProBNP (last 3 results) No results for input(s): PROBNP in the last 8760 hours.  CBG:  Recent Labs Lab 05/27/15 0549 05/27/15 0645 05/27/15 0758 05/27/15 0929 05/27/15 1202  GLUCAP 155* 149* 163* 133* 80    Recent Results (from the past 240 hour(s))  MRSA PCR Screening     Status: None   Collection Time: 05/23/15  7:35 AM  Result Value Ref Range Status   MRSA by PCR NEGATIVE NEGATIVE Final    Comment:        The GeneXpert MRSA Assay (FDA approved for NASAL specimens only), is one component of a comprehensive MRSA colonization surveillance program. It is not intended to diagnose MRSA infection nor to guide or monitor treatment for MRSA infections.   MRSA PCR Screening     Status: None   Collection Time: 05/26/15  9:55 PM  Result Value Ref Range Status   MRSA by PCR NEGATIVE NEGATIVE Final    Comment:        The GeneXpert MRSA Assay (FDA approved for NASAL specimens only), is one component of a comprehensive MRSA colonization surveillance program. It is not intended to diagnose MRSA infection nor to guide or monitor treatment for MRSA infections.      Studies: Ir US Guide Vasc Access Right  05/27/2015  ADDENDUM REPORT: 05/27/2015 10:51 ADDENDUM: Nesacaine was used as a local  anesthetic rather than lidocaine due to an allergy. Electronically Signed   By: Markus Daft M.D.   On: 05/27/2015 10:51  05/27/2015  CLINICAL DATA:  49 year old with gastroparesis, persistent retching and poor venous access. EXAM: POWER PICC LINE PLACEMENT WITH ULTRASOUND AND FLUOROSCOPIC GUIDANCE FLUOROSCOPY TIME:  12 seconds, 2 mGy PROCEDURE: The patient was advised of the possible risks and complications and agreed to undergo the procedure. The patient was then brought to the angiographic suite for the procedure. The right arm was prepped with chlorhexidine, draped in the usual sterile fashion using maximum barrier technique (cap and mask, sterile gown, sterile gloves, large sterile sheet, hand hygiene and cutaneous antisepsis) and infiltrated locally with 1% Lidocaine. Ultrasound demonstrated  patency of the right brachial vein, and this was documented with an image. Under real-time ultrasound guidance, this vein was accessed with a 21 gauge micropuncture needle and image documentation was performed. A 0.018 wire was introduced in to the vein. Over this, a 5 [place] dual lumen power injectable PICC was advanced to the lower SVC/right atrial junction. Fluoroscopy during the procedure and fluoro spot radiograph confirms appropriate catheter position. The catheter was flushed and covered with a sterile dressing. Catheter length: 42 cm Complications: None Estimated blood loss: Minimal IMPRESSION: Successful right arm Power PICC line placement with ultrasound and fluoroscopic guidance. The catheter is ready for use. Electronically Signed: By: Markus Daft M.D. On: 05/27/2015 08:19   Ir Fluoro Guide Cv Midline Picc Right  05/27/2015  ADDENDUM REPORT: 05/27/2015 10:51 ADDENDUM: Nesacaine was used as a local anesthetic rather than lidocaine due to an allergy. Electronically Signed   By: Markus Daft M.D.   On: 05/27/2015 10:51  05/27/2015  CLINICAL DATA:  49 year old with gastroparesis, persistent retching and poor venous  access. EXAM: POWER PICC LINE PLACEMENT WITH ULTRASOUND AND FLUOROSCOPIC GUIDANCE FLUOROSCOPY TIME:  12 seconds, 2 mGy PROCEDURE: The patient was advised of the possible risks and complications and agreed to undergo the procedure. The patient was then brought to the angiographic suite for the procedure. The right arm was prepped with chlorhexidine, draped in the usual sterile fashion using maximum barrier technique (cap and mask, sterile gown, sterile gloves, large sterile sheet, hand hygiene and cutaneous antisepsis) and infiltrated locally with 1% Lidocaine. Ultrasound demonstrated patency of the right brachial vein, and this was documented with an image. Under real-time ultrasound guidance, this vein was accessed with a 21 gauge micropuncture needle and image documentation was performed. A 0.018 wire was introduced in to the vein. Over this, a 5 [place] dual lumen power injectable PICC was advanced to the lower SVC/right atrial junction. Fluoroscopy during the procedure and fluoro spot radiograph confirms appropriate catheter position. The catheter was flushed and covered with a sterile dressing. Catheter length: 42 cm Complications: None Estimated blood loss: Minimal IMPRESSION: Successful right arm Power PICC line placement with ultrasound and fluoroscopic guidance. The catheter is ready for use. Electronically Signed: By: Markus Daft M.D. On: 05/27/2015 08:19    Scheduled Meds: . insulin aspart  0-15 Units Subcutaneous TID WC  . insulin glargine  10 Units Subcutaneous Daily  . pantoprazole (PROTONIX) IV  40 mg Intravenous QHS  . sodium chloride  1,000 mL Intravenous Once  . sodium chloride  10-40 mL Intracatheter Q12H   Continuous Infusions: . sodium chloride 125 mL/hr at 05/27/15 1000  . insulin (NOVOLIN-R) infusion Stopped (05/27/15 0935)    Principal Problem:   Diabetic ketoacidosis without coma associated with type 1 diabetes mellitus (HCC) Active Problems:   Gastroparesis due to DM (HCC)    Nausea & vomiting   Anxiety and depression   DKA (diabetic ketoacidoses) ([place])   Anemia   Hypomagnesemia    Time spent:25 minutes    Lilliann Rossetti  [facility] Pager [phone] If 7PM-7AM, please contact night-coverage at [email or web address], password [facility] 05/27/2015, 3:24 PM  LOS: 1 day

## 2015-05-28 DIAGNOSIS — E1143 Type 2 diabetes mellitus with diabetic autonomic (poly)neuropathy: Secondary | ICD-10-CM | POA: Diagnosis not present

## 2015-05-28 DIAGNOSIS — E860 Dehydration: Secondary | ICD-10-CM | POA: Diagnosis not present

## 2015-05-28 DIAGNOSIS — K3184 Gastroparesis: Secondary | ICD-10-CM | POA: Diagnosis not present

## 2015-05-28 DIAGNOSIS — E1065 Type 1 diabetes mellitus with hyperglycemia: Secondary | ICD-10-CM

## 2015-05-28 DIAGNOSIS — E1043 Type 1 diabetes mellitus with diabetic autonomic (poly)neuropathy: Secondary | ICD-10-CM

## 2015-05-28 LAB — BASIC METABOLIC PANEL
Anion gap: 11 (ref 5–15)
BUN: 8 mg/dL (ref 6–20)
CO2: 19 mmol/L — ABNORMAL LOW (ref 22–32)
CREATININE: 0.92 mg/dL (ref 0.44–1.00)
Calcium: 8.5 mg/dL — ABNORMAL LOW (ref 8.9–10.3)
Chloride: 108 mmol/L (ref 101–111)
GFR calc Af Amer: >60 mL/min (ref >60)
Glucose, Bld: 233 mg/dL — ABNORMAL HIGH (ref 65–99)
Potassium: 3.6 mmol/L (ref 3.5–5.1)
SODIUM: 138 mmol/L (ref 135–145)

## 2015-05-28 LAB — CBC
HCT: 30.6 % — ABNORMAL LOW (ref 36.0–46.0)
Hemoglobin: 9.7 g/dL — ABNORMAL LOW (ref 12.0–15.0)
MCH: 27.5 pg (ref 26.0–34.0)
MCHC: 31.7 g/dL (ref 30.0–36.0)
MCV: 86.7 fL (ref 78.0–100.0)
PLATELETS: 393 10*3/uL (ref 150–400)
RBC: 3.53 MIL/uL — ABNORMAL LOW (ref 3.87–5.11)
RDW: 16.1 % — ABNORMAL HIGH (ref 11.5–15.5)
WBC: 10.4 10*3/uL (ref 4.0–10.5)

## 2015-05-28 LAB — GLUCOSE, CAPILLARY
GLUCOSE-CAPILLARY: 292 mg/dL — AB (ref 65–99)
Glucose-Capillary: 342 mg/dL — ABNORMAL HIGH (ref 65–99)
Glucose-Capillary: 140 mg/dL — ABNORMAL HIGH (ref 65–99)
Glucose-Capillary: 93 mg/dL (ref 65–99)
Glucose-Capillary: 116 mg/dL — ABNORMAL HIGH (ref 65–99)
Glucose-Capillary: 75 mg/dL (ref 65–99)

## 2015-05-28 MED ORDER — DEXTROSE-NACL 5-0.9 % IV SOLN
INTRAVENOUS | Status: DC
  Administered 2015-05-28 – 2015-05-29 (×2): via INTRAVENOUS

## 2015-05-28 MED ORDER — INSULIN GLARGINE 100 UNIT/ML ~~LOC~~ SOLN
18.0000 [IU] | Freq: Every day | SUBCUTANEOUS | Status: DC
  Administered 2015-05-29: 18 [IU] via SUBCUTANEOUS
  Filled 2015-05-28: qty 0.18

## 2015-05-28 MED ORDER — PROMETHAZINE HCL 25 MG/ML IJ SOLN
25.0000 mg | INTRAMUSCULAR | Status: DC
  Administered 2015-05-28 – 2015-05-31 (×14): 25 mg via INTRAVENOUS
  Filled 2015-05-28 (×14): qty 1

## 2015-05-28 NOTE — H&P (Deleted)
[facility] PROGRESS NOTE  [name] [id number] DOB: [birth year] DOA: 05/26/2015 PCP: Barbette Merino, MD    brief narrative 49 year old female with uncontrolled type 1 diabetes mellitus with severe gastroparesis (being followed by GI at [facility] with plan on gastric stimulator placement by Dr. Toney Rakes with recurrent hospitalization for the past few months with DKA and severe gastroparesis symptoms. (This is her ninth hospitalization since April for similar issue and 3 times already in fast 10 days). Patient discharged 3 days back and return to the ED with multiple episodes of nausea and vomiting associated with abdominal pain since the day prior to admission. Patient reports taking her insulin as instructed and being compliant with recommended diet. Patient found to be severely dehydrated and in DKA. Admitted to stepdown and started on aggressive IV hydration and insulin drip.   Assessment/Plan: DKA with uncontrolled type 1 diabetes mellitus and severe recurrent gastroparesis Admitted to stepdown started on IV insulin with aggressive IV hydration. Anion gap closed. Switched to sliding scale insulin and Lantus. A1c of 7.4. fsg in 300s this am. continue SSI coverage as pt still taking by mouth. Increase Lantus to 18 units at bedtime.   Severe gastroparesis Patient initially followed by [facility] but was referred to [facility] as her symptoms was uncontrollable. Patient has had multiple visits to ED with congestive pleuritic symptoms and multiple hospitalizations as well. Intolerant to Reglan (causes a rash and numbness of her tongue). Switch Phenergan to scheduled. When necessary Zofran. Aggressive IV hydration. Monitor I's -As per GI note at [facility] from September she is being evaluated by Dr. Toney Rakes for placement of a gastric stimulator. A reports that her insurance has recently been activated. As per GI note the procedure is scheduled in ?January. -Continue  Protonix. She is still nauseous but no vomiting. Will try a course of the toe mycin if not improved.  Anxiety and depression On Xanax at home. Ordered when necessary IV Ativan. Resume her antidepression once tolerating by mouth  Anemia Stable.  Hypokalemia and hypomagnesemia Replenish  Essential hypertension with elevated blood pressure Ordered when necessary hydralazine.  Protein calorie malnutrition   DVT prophylaxis: Subcutaneous Lovenox Diet: Clear liquid as tolerated   Code Status: Full code Family Communication: None at bedside Disposition Plan: transfer to tele   Consultants:  None  Procedures:  none  Antibiotics:  None  HPI/Subjective: Patient still complaining of nausea but no vomiting. Tachycardic and elevated blood pressure.  Objective: Filed Vitals:   05/28/15 0959 05/28/15 1046  BP: 110/50 144/60  Pulse: 111 119  Temp: 98.4 F (36.9 C) 98.6 F (37 C)  Resp: 14 16    Intake/Output Summary (Last 24 hours) at 05/28/15 1137 Last data filed at 05/28/15 1000  Gross per 24 hour  Intake   3000 ml  Output   1975 ml  Net   1025 ml   Filed Weights   05/28/15 0800  Weight: 56.8 kg (125 lb 3.5 oz)    Exam:   General:  Appears fatigued, talking minimally  HEENT: No pallor, dry oral mucosa, supple neck  Chest: Clear to auscultation bilaterally  CVS: S1 and S2 tachycardic, no murmurs rub or gallop  GI: Soft, nondistended, epigastric tenderness, bowel sounds present  Musculoskeletal: Warm, no edema   Data Reviewed: Basic Metabolic Panel:  Recent Labs Lab 05/23/15 1450  05/26/15 1825 05/26/15 1952 05/26/15 2252 05/27/15 0355 05/27/15 0650 05/28/15 0345  NA 138  < > 135  --  141 137 139  138  K 3.7  < > 3.7  --  3.1* 3.6 3.7 3.6  CL 104  < > 96*  --  112* 107 106 108  CO2 20*  < > 19*  --  19* 25 23 19*  GLUCOSE 170*  < > 489*  --  276* 156* 174* 233*  BUN 12  < > 11  --  11 10 8 8   CREATININE 0.93  < > 1.02*  --  0.84 0.76  0.73 0.92  CALCIUM 8.5*  < > 9.4  --  7.9* 8.4* 8.8* 8.5*  MG 1.4*  --   --  1.6*  --   --   --   --   PHOS 2.6  --   --  4.1  --  1.7* 2.1*  --   < > = values in this interval not displayed. Liver Function Tests:  Recent Labs Lab 05/23/15 0449 05/26/15 1825  AST 24 25  ALT 21 25  ALKPHOS 92 95  BILITOT 1.6* 1.5*  PROT 7.9 7.6  ALBUMIN 4.0 4.1    Recent Labs Lab 05/23/15 0449  LIPASE 43   No results for input(s): AMMONIA in the last 168 hours. CBC:  Recent Labs Lab 05/23/15 0449 05/26/15 1825 05/27/15 0650 05/28/15 0345  WBC 14.7* 10.1 13.3* 10.4  NEUTROABS 13.3*  --  10.9*  --   HGB 10.6* 10.5* 9.8* 9.7*  HCT 32.0* 32.3* 29.8* 30.6*  MCV 84.9 84.8 82.5 86.7  PLT 387 382 373 393   Cardiac Enzymes: No results for input(s): CKTOTAL, CKMB, CKMBINDEX, TROPONINI in the last 168 hours. BNP (last 3 results) No results for input(s): BNP in the last 8760 hours.  ProBNP (last 3 results) No results for input(s): PROBNP in the last 8760 hours.  CBG:  Recent Labs Lab 05/27/15 1554 05/27/15 1705 05/27/15 1949 05/28/15 0517 05/28/15 0802  GLUCAP 312* 258* 162* 292* 342*    Recent Results (from the past 240 hour(s))  MRSA PCR Screening     Status: None   Collection Time: 05/23/15  7:35 AM  Result Value Ref Range Status   MRSA by PCR NEGATIVE NEGATIVE Final    Comment:        The GeneXpert MRSA Assay (FDA approved for NASAL specimens only), is one component of a comprehensive MRSA colonization surveillance program. It is not intended to diagnose MRSA infection nor to guide or monitor treatment for MRSA infections.   MRSA PCR Screening     Status: None   Collection Time: 05/26/15  9:55 PM  Result Value Ref Range Status   MRSA by PCR NEGATIVE NEGATIVE Final    Comment:        The GeneXpert MRSA Assay (FDA approved for NASAL specimens only), is one component of a comprehensive MRSA colonization surveillance program. It is not intended to diagnose  MRSA infection nor to guide or monitor treatment for MRSA infections.      Studies: Ir US Guide Vasc Access Right  05/27/2015  ADDENDUM REPORT: 05/27/2015 10:51 ADDENDUM: Nesacaine was used as a local anesthetic rather than lidocaine due to an allergy. Electronically Signed   By: Markus Daft M.D.   On: 05/27/2015 10:51  05/27/2015  CLINICAL DATA:  49 year old with gastroparesis, persistent retching and poor venous access. EXAM: POWER PICC LINE PLACEMENT WITH ULTRASOUND AND FLUOROSCOPIC GUIDANCE FLUOROSCOPY TIME:  12 seconds, 2 mGy PROCEDURE: The patient was advised of the possible risks and complications and agreed to undergo the procedure. The  patient was then brought to the angiographic suite for the procedure. The right arm was prepped with chlorhexidine, draped in the usual sterile fashion using maximum barrier technique (cap and mask, sterile gown, sterile gloves, large sterile sheet, hand hygiene and cutaneous antisepsis) and infiltrated locally with 1% Lidocaine. Ultrasound demonstrated patency of the right brachial vein, and this was documented with an image. Under real-time ultrasound guidance, this vein was accessed with a 21 gauge micropuncture needle and image documentation was performed. A 0.018 wire was introduced in to the vein. Over this, a 5 [place] dual lumen power injectable PICC was advanced to the lower SVC/right atrial junction. Fluoroscopy during the procedure and fluoro spot radiograph confirms appropriate catheter position. The catheter was flushed and covered with a sterile dressing. Catheter length: 42 cm Complications: None Estimated blood loss: Minimal IMPRESSION: Successful right arm Power PICC line placement with ultrasound and fluoroscopic guidance. The catheter is ready for use. Electronically Signed: By: Markus Daft M.D. On: 05/27/2015 08:19   Ir Fluoro Guide Cv Midline Picc Right  05/27/2015  ADDENDUM REPORT: 05/27/2015 10:51 ADDENDUM: Nesacaine was used as a local  anesthetic rather than lidocaine due to an allergy. Electronically Signed   By: Markus Daft M.D.   On: 05/27/2015 10:51  05/27/2015  CLINICAL DATA:  49 year old with gastroparesis, persistent retching and poor venous access. EXAM: POWER PICC LINE PLACEMENT WITH ULTRASOUND AND FLUOROSCOPIC GUIDANCE FLUOROSCOPY TIME:  12 seconds, 2 mGy PROCEDURE: The patient was advised of the possible risks and complications and agreed to undergo the procedure. The patient was then brought to the angiographic suite for the procedure. The right arm was prepped with chlorhexidine, draped in the usual sterile fashion using maximum barrier technique (cap and mask, sterile gown, sterile gloves, large sterile sheet, hand hygiene and cutaneous antisepsis) and infiltrated locally with 1% Lidocaine. Ultrasound demonstrated patency of the right brachial vein, and this was documented with an image. Under real-time ultrasound guidance, this vein was accessed with a 21 gauge micropuncture needle and image documentation was performed. A 0.018 wire was introduced in to the vein. Over this, a 5 [place] dual lumen power injectable PICC was advanced to the lower SVC/right atrial junction. Fluoroscopy during the procedure and fluoro spot radiograph confirms appropriate catheter position. The catheter was flushed and covered with a sterile dressing. Catheter length: 42 cm Complications: None Estimated blood loss: Minimal IMPRESSION: Successful right arm Power PICC line placement with ultrasound and fluoroscopic guidance. The catheter is ready for use. Electronically Signed: By: Markus Daft M.D. On: 05/27/2015 08:19    Scheduled Meds: . enoxaparin (LOVENOX) injection  40 mg Subcutaneous Q24H  . insulin aspart  0-15 Units Subcutaneous TID WC  . insulin glargine  10 Units Subcutaneous Daily  . pantoprazole (PROTONIX) IV  40 mg Intravenous QHS  . promethazine  25 mg Intravenous 6 times per day  . sodium chloride  1,000 mL Intravenous Once  . sodium  chloride  10-40 mL Intracatheter Q12H   Continuous Infusions: . sodium chloride 125 mL/hr at 05/28/15 0017  . insulin (NOVOLIN-R) infusion Stopped (05/27/15 0935)      Time spent:25 minutes    Daphney Hopke, [facility] Pager [phone] If 7PM-7AM, please contact night-coverage at [email or web address], password [facility] 05/28/2015, 11:37 AM  LOS: 2 days

## 2015-05-28 NOTE — Progress Notes (Signed)
brief narrative 49 year old female with uncontrolled type 1 diabetes mellitus with severe gastroparesis (being followed by GI at [facility] with plan on gastric stimulator placement by Dr. Toney Rakes with recurrent hospitalization for the past few months with DKA and severe gastroparesis symptoms. (This is her ninth hospitalization since April for similar issue and 3 times already in fast 10 days). Patient discharged 3 days back and return to the ED with multiple episodes of nausea and vomiting associated with abdominal pain since the day prior to admission. Patient reports taking her insulin as instructed and being compliant with recommended diet. Patient found to be severely dehydrated and in DKA. Admitted to stepdown and started on aggressive IV hydration and insulin drip.  Assessment/Plan: DKA with uncontrolled type 1 diabetes mellitus and severe recurrent gastroparesis Admitted to stepdown started on IV insulin with aggressive IV hydration. Anion gap closed. Switched to sliding scale insulin and Lantus. A1c of 7.4. fsg in 300s this am. continue SSI coverage as pt still taking by mouth. Increase Lantus to 18 units at bedtime.   Severe gastroparesis Patient initially followed by [facility] but was referred to [facility] as her symptoms was uncontrollable. Patient has had multiple visits to ED with congestive pleuritic symptoms and multiple hospitalizations as well. Intolerant to Reglan (causes a rash and numbness of her tongue). Switch Phenergan to scheduled. When necessary Zofran. Aggressive IV hydration. Monitor I's -As per GI note at [facility] from September she is being evaluated by Dr. Toney Rakes for placement of a gastric stimulator. A reports that her insurance has recently been activated. As per GI note the procedure is scheduled in ?January. -Continue Protonix. She is still nauseous but no vomiting. Will try a course of the toe mycin if not improved.  Anxiety and  depression On Xanax at home. Ordered when necessary IV Ativan. Resume her antidepression once tolerating by mouth  Anemia Stable.  Hypokalemia and hypomagnesemia Replenish  Essential hypertension with elevated blood pressure Ordered when necessary hydralazine.  Protein calorie malnutrition   DVT prophylaxis: Subcutaneous Lovenox Diet: Clear liquid as tolerated   Code Status: Full code Family Communication: None at bedside Disposition Plan: transfer to tele   Consultants:  None  Procedures:  none  Antibiotics:  None  HPI/Subjective: Patient still complaining of nausea but no vomiting. Tachycardic and elevated blood pressure.  Objective: Filed Vitals:   05/28/15 0959 05/28/15 1046  BP: 110/50 144/60  Pulse: 111 119  Temp: 98.4 F (36.9 C) 98.6 F (37 C)  Resp: 14 16    Intake/Output Summary (Last 24 hours) at 05/28/15 1137 Last data filed at 05/28/15 1000  Gross per 24 hour  Intake  3000 ml  Output  1975 ml  Net  1025 ml   Filed Weights   05/28/15 0800  Weight: 56.8 kg (125 lb 3.5 oz)    Exam:   General: Appears fatigued, talking minimally  HEENT: No pallor, dry oral mucosa, supple neck  Chest: Clear to auscultation bilaterally  CVS: S1 and S2 tachycardic, no murmurs rub or gallop  GI: Soft, nondistended, epigastric tenderness, bowel sounds present  Musculoskeletal: Warm, no edema   Data Reviewed: Basic Metabolic Panel:  Last Labs      Recent Labs Lab 05/23/15 1450  05/26/15 1825 05/26/15 1952 05/26/15 2252 05/27/15 0355 05/27/15 0650 05/28/15 0345  NA 138 < > 135 --  141 137 139 138  K 3.7 < > 3.7 --  3.1* 3.6 3.7 3.6  CL 104 < >  96* --  112* 107 106 108  CO2 20* < > 19* --  19* 25 23 19*  GLUCOSE 170* < > 489* --  276* 156* 174* 233*  BUN 12 < > 11 --  11 10 8 8   CREATININE 0.93 < > 1.02* --  0.84 0.76  0.73 0.92  CALCIUM 8.5* < > 9.4 --  7.9* 8.4* 8.8* 8.5*  MG 1.4* --  --  1.6* --  --  --  --   PHOS 2.6 --  --  4.1 --  1.7* 2.1* --   < > = values in this interval not displayed.   Liver Function Tests:  Last Labs      Recent Labs Lab 05/23/15 0449 05/26/15 1825  AST 24 25  ALT 21 25  ALKPHOS 92 95  BILITOT 1.6* 1.5*  PROT 7.9 7.6  ALBUMIN 4.0 4.1      Last Labs      Recent Labs Lab 05/23/15 0449  LIPASE 43      Last Labs     No results for input(s): AMMONIA in the last 168 hours.   CBC:  Last Labs      Recent Labs Lab 05/23/15 0449 05/26/15 1825 05/27/15 0650 05/28/15 0345  WBC 14.7* 10.1 13.3* 10.4  NEUTROABS 13.3* --  10.9* --   HGB 10.6* 10.5* 9.8* 9.7*  HCT 32.0* 32.3* 29.8* 30.6*  MCV 84.9 84.8 82.5 86.7  PLT 387 382 373 393     Cardiac Enzymes:  Last Labs     No results for input(s): CKTOTAL, CKMB, CKMBINDEX, TROPONINI in the last 168 hours.   BNP (last 3 results)  Recent Labs (within last 365 days)    No results for input(s): BNP in the last 8760 hours.    ProBNP (last 3 results)  Recent Labs (within last 365 days)    No results for input(s): PROBNP in the last 8760 hours.    CBG:  Last Labs      Recent Labs Lab 05/27/15 1554 05/27/15 1705 05/27/15 1949 05/28/15 0517 05/28/15 0802  GLUCAP 312* 258* 162* 292* 342*      Recent Results (from the past 240 hour(s))  MRSA PCR Screening Status: None   Collection Time: 05/23/15 7:35 AM  Result Value Ref Range Status   MRSA by PCR NEGATIVE NEGATIVE Final    Comment:   The GeneXpert MRSA Assay (FDA approved for NASAL specimens only), is one component of a comprehensive MRSA colonization surveillance program. It is not intended to diagnose MRSA infection nor to guide or monitor treatment for MRSA infections.   MRSA PCR Screening  Status: None   Collection Time: 05/26/15 9:55 PM  Result Value Ref Range Status   MRSA by PCR NEGATIVE NEGATIVE Final    Comment:   The GeneXpert MRSA Assay (FDA approved for NASAL specimens only), is one component of a comprehensive MRSA colonization surveillance program. It is not intended to diagnose MRSA infection nor to guide or monitor treatment for MRSA infections.      Studies:  Imaging Results (Last 48 hours)    Ir US Guide Vasc Access Right  05/27/2015 ADDENDUM REPORT: 05/27/2015 10:51 ADDENDUM: Nesacaine was used as a local anesthetic rather than lidocaine due to an allergy. Electronically Signed By: Markus Daft M.D. On: 05/27/2015 10:51  05/27/2015 CLINICAL DATA: 49 year old with gastroparesis, persistent retching and poor venous access. EXAM: POWER PICC LINE PLACEMENT WITH ULTRASOUND AND FLUOROSCOPIC GUIDANCE FLUOROSCOPY TIME: 12 seconds, 2 mGy PROCEDURE: The  patient was advised of the possible risks and complications and agreed to undergo the procedure. The patient was then brought to the angiographic suite for the procedure. The right arm was prepped with chlorhexidine, draped in the usual sterile fashion using maximum barrier technique (cap and mask, sterile gown, sterile gloves, large sterile sheet, hand hygiene and cutaneous antisepsis) and infiltrated locally with 1% Lidocaine. Ultrasound demonstrated patency of the right brachial vein, and this was documented with an image. Under real-time ultrasound guidance, this vein was accessed with a 21 gauge micropuncture needle and image documentation was performed. A 0.018 wire was introduced in to the vein. Over this, a 5 [place] dual lumen power injectable PICC was advanced to the lower SVC/right atrial junction. Fluoroscopy during the procedure and fluoro spot radiograph confirms appropriate catheter position. The catheter was flushed and covered with a sterile dressing. Catheter length: 42 cm  Complications: None Estimated blood loss: Minimal IMPRESSION: Successful right arm Power PICC line placement with ultrasound and fluoroscopic guidance. The catheter is ready for use. Electronically Signed: By: Markus Daft M.D. On: 05/27/2015 08:19   Ir Fluoro Guide Cv Midline Picc Right  05/27/2015 ADDENDUM REPORT: 05/27/2015 10:51 ADDENDUM: Nesacaine was used as a local anesthetic rather than lidocaine due to an allergy. Electronically Signed By: Markus Daft M.D. On: 05/27/2015 10:51  05/27/2015 CLINICAL DATA: 49 year old with gastroparesis, persistent retching and poor venous access. EXAM: POWER PICC LINE PLACEMENT WITH ULTRASOUND AND FLUOROSCOPIC GUIDANCE FLUOROSCOPY TIME: 12 seconds, 2 mGy PROCEDURE: The patient was advised of the possible risks and complications and agreed to undergo the procedure. The patient was then brought to the angiographic suite for the procedure. The right arm was prepped with chlorhexidine, draped in the usual sterile fashion using maximum barrier technique (cap and mask, sterile gown, sterile gloves, large sterile sheet, hand hygiene and cutaneous antisepsis) and infiltrated locally with 1% Lidocaine. Ultrasound demonstrated patency of the right brachial vein, and this was documented with an image. Under real-time ultrasound guidance, this vein was accessed with a 21 gauge micropuncture needle and image documentation was performed. A 0.018 wire was introduced in to the vein. Over this, a 5 [place] dual lumen power injectable PICC was advanced to the lower SVC/right atrial junction. Fluoroscopy during the procedure and fluoro spot radiograph confirms appropriate catheter position. The catheter was flushed and covered with a sterile dressing. Catheter length: 42 cm Complications: None Estimated blood loss: Minimal IMPRESSION: Successful right arm Power PICC line placement with ultrasound and fluoroscopic guidance. The catheter is ready for use. Electronically Signed: By: Markus Daft M.D. On: 05/27/2015 08:19     Scheduled Meds: . enoxaparin (LOVENOX) injection 40 mg Subcutaneous Q24H  . insulin aspart 0-15 Units Subcutaneous TID WC  . insulin glargine 10 Units Subcutaneous Daily  . pantoprazole (PROTONIX) IV 40 mg Intravenous QHS  . promethazine 25 mg Intravenous 6 times per day  . sodium chloride 1,000 mL Intravenous Once  . sodium chloride 10-40 mL Intracatheter Q12H   Continuous Infusions: . sodium chloride 125 mL/hr at 05/28/15 0017  . insulin (NOVOLIN-R) infusion Stopped (05/27/15 0935)      Time spent:25 minutes    Malakie Balis, [place] [facility] Pager [phone] If 7PM-7AM, please contact night-coverage at [email or web address], password [facility] 05/28/2015, 11:37 AM  LOS: 2 days

## 2015-05-29 DIAGNOSIS — E108 Type 1 diabetes mellitus with unspecified complications: Secondary | ICD-10-CM

## 2015-05-29 DIAGNOSIS — F418 Other specified anxiety disorders: Secondary | ICD-10-CM | POA: Diagnosis not present

## 2015-05-29 DIAGNOSIS — E1143 Type 2 diabetes mellitus with diabetic autonomic (poly)neuropathy: Secondary | ICD-10-CM | POA: Diagnosis not present

## 2015-05-29 DIAGNOSIS — K3184 Gastroparesis: Secondary | ICD-10-CM | POA: Diagnosis not present

## 2015-05-29 LAB — GLUCOSE, CAPILLARY
GLUCOSE-CAPILLARY: 176 mg/dL — AB (ref 65–99)
GLUCOSE-CAPILLARY: 100 mg/dL — AB (ref 65–99)
Glucose-Capillary: 95 mg/dL (ref 65–99)
Glucose-Capillary: 144 mg/dL — ABNORMAL HIGH (ref 65–99)
Glucose-Capillary: 137 mg/dL — ABNORMAL HIGH (ref 65–99)

## 2015-05-29 LAB — PYRUVIC ACID: Pyruvic Acid, Blood: 0.94 mg/dL (ref 0.30–1.50)

## 2015-05-29 MED ORDER — SODIUM CHLORIDE 0.9 % IV SOLN
INTRAVENOUS | Status: DC
  Administered 2015-05-29 – 2015-05-30 (×2): via INTRAVENOUS

## 2015-05-29 MED ORDER — INSULIN GLARGINE 100 UNIT/ML ~~LOC~~ SOLN
10.0000 [IU] | Freq: Every day | SUBCUTANEOUS | Status: DC
  Filled 2015-05-29: qty 0.1

## 2015-05-29 MED ORDER — SODIUM CHLORIDE 0.9 % IV SOLN
3.0000 mg/kg | Freq: Every day | INTRAVENOUS | Status: AC
  Administered 2015-05-29 – 2015-05-31 (×3): 170 mg via INTRAVENOUS
  Filled 2015-05-29 (×4): qty 3.4

## 2015-05-29 MED ORDER — SODIUM CHLORIDE 0.9 % IV BOLUS (SEPSIS)
1000.0000 mL | Freq: Once | INTRAVENOUS | Status: AC
  Administered 2015-05-29: 1000 mL via INTRAVENOUS

## 2015-05-29 MED ORDER — SODIUM CHLORIDE 0.9 % IV SOLN
500.0000 mg | Freq: Every day | INTRAVENOUS | Status: DC
  Filled 2015-05-29: qty 10

## 2015-05-29 NOTE — Progress Notes (Signed)
[facility] PROGRESS NOTE  [name] [id number] DOB: June 20, 1966 DOA: 05/26/2015 PCP: Barbette Merino, MD  49 year old female with uncontrolled type 1 diabetes mellitus with severe gastroparesis (being followed by GI at [facility] with plan on gastric stimulator placement by Dr. Toney Rakes with recurrent hospitalization for the past few months with DKA and severe gastroparesis symptoms. (This is her ninth hospitalization since April for similar issue and 3 times already in fast 10 days). Patient discharged 3 days back and return to the ED with multiple episodes of nausea and vomiting associated with abdominal pain since the day prior to admission. Patient reports taking her insulin as instructed and being compliant with recommended diet. Patient found to be severely dehydrated and in DKA. Admitted to stepdown and started on aggressive IV hydration and insulin drip.  Assessment/Plan: DKA with uncontrolled type 1 diabetes mellitus and severe recurrent gastroparesis Admitted to stepdown started on IV insulin with aggressive IV hydration. Anion gap closed. Switched to sliding scale insulin and Lantus. A1c of 7.4. Marland Kitchen continue SSI coverage as pt still taking by mouth. Increase Lantus to 18 units at bedtime. FSG better control today.   Severe gastroparesis Patient initially followed by [facility] but was referred to [facility] as her symptoms was uncontrollable. Patient has had multiple visits to ED with congestive pleuritic symptoms and multiple hospitalizations as well. Intolerant to Reglan (causes a rash and numbness of her tongue). Placed on scheduled Phenergan. When necessary Zofran. Aggressive IV hydration. Monitor I/O. -As per GI note at [facility] from September she is being evaluated by Dr. Toney Rakes for placement of a gastric stimulator. A reports that her insurance has recently been activated. As per GI note the procedure is scheduled in ?January. -Continue Protonix.   -Nausea continues but no vomiting. Encouraged on trying some liquid. If still not tolerated will try a course of erythromycin.  Anxiety and depression On Xanax at home. Ordered when necessary IV Ativan. Resume her antidepression once tolerating by mouth  Anemia Stable.  Hypokalemia and hypomagnesemia Replenish  Essential hypertension with elevated blood pressure Ordered when necessary hydralazine.  Protein calorie malnutrition   DVT prophylaxis: Subcutaneous Lovenox Diet: Clear liquid as tolerated   Code Status: Full code Family Communication: None at bedside Disposition Plan:  telemetry monitoring   Consultants:  None  Procedures:  none  Antibiotics:  None  HPI/Subjective: Patient hesitant to try any food being scared of having abdominal pain and vomiting. She would like to try some liquid today. Attempted drinking some ginger ale yesterday but made her nauseous.  Objective: Filed Vitals:   05/29/15 0224 05/29/15 0618  BP: 148/77 183/85  Pulse: 102 112  Temp: 98.1 F (36.7 C) 98.9 F (37.2 C)  Resp: 16 16    Intake/Output Summary (Last 24 hours) at 05/29/15 1148 Last data filed at 05/29/15 1025  Gross per 24 hour  Intake 926.25 ml  Output   2550 ml  Net -1623.75 ml   Filed Weights   05/28/15 0800  Weight: 56.8 kg (125 lb 3.5 oz)    Exam:   General: Appears fatigued,  HEENT: dry oral mucosa, supple neck  Chest: Clear to auscultation bilaterally  CVS: S1 and S2 tachycardic, no murmurs  GI: Soft, nondistended, epigastric tenderness, bowel sounds present  Musculoskeletal: Warm, no edema  Data Reviewed: Basic Metabolic Panel:  Recent Labs Lab 05/23/15 1450  05/26/15 1825 05/26/15 1952 05/26/15 2252 05/27/15 0355 05/27/15 0650 05/28/15 0345  NA 138  < > 135  --  141 137 139 138  K 3.7  < > 3.7  --  3.1* 3.6 3.7 3.6  CL 104  < > 96*  --  112* 107 106 108  CO2 20*  < > 19*  --  19* 25 23 19*  GLUCOSE 170*  < > 489*  --  276*  156* 174* 233*  BUN 12  < > 11  --  11 10 8 8   CREATININE 0.93  < > 1.02*  --  0.84 0.76 0.73 0.92  CALCIUM 8.5*  < > 9.4  --  7.9* 8.4* 8.8* 8.5*  MG 1.4*  --   --  1.6*  --   --   --   --   PHOS 2.6  --   --  4.1  --  1.7* 2.1*  --   < > = values in this interval not displayed. Liver Function Tests:  Recent Labs Lab 05/23/15 0449 05/26/15 1825  AST 24 25  ALT 21 25  ALKPHOS 92 95  BILITOT 1.6* 1.5*  PROT 7.9 7.6  ALBUMIN 4.0 4.1    Recent Labs Lab 05/23/15 0449  LIPASE 43   No results for input(s): AMMONIA in the last 168 hours. CBC:  Recent Labs Lab 05/23/15 0449 05/26/15 1825 05/27/15 0650 05/28/15 0345  WBC 14.7* 10.1 13.3* 10.4  NEUTROABS 13.3*  --  10.9*  --   HGB 10.6* 10.5* 9.8* 9.7*  HCT 32.0* 32.3* 29.8* 30.6*  MCV 84.9 84.8 82.5 86.7  PLT 387 382 373 393   Cardiac Enzymes: No results for input(s): CKTOTAL, CKMB, CKMBINDEX, TROPONINI in the last 168 hours. BNP (last 3 results) No results for input(s): BNP in the last 8760 hours.  ProBNP (last 3 results) No results for input(s): PROBNP in the last 8760 hours.  CBG:  Recent Labs Lab 05/28/15 1648 05/28/15 2130 05/29/15 0222 05/29/15 0611 05/29/15 0743  GLUCAP 93 75 95 144* 176*    Recent Results (from the past 240 hour(s))  MRSA PCR Screening     Status: None   Collection Time: 05/23/15  7:35 AM  Result Value Ref Range Status   MRSA by PCR NEGATIVE NEGATIVE Final    Comment:        The GeneXpert MRSA Assay (FDA approved for NASAL specimens only), is one component of a comprehensive MRSA colonization surveillance program. It is not intended to diagnose MRSA infection nor to guide or monitor treatment for MRSA infections.   MRSA PCR Screening     Status: None   Collection Time: 05/26/15  9:55 PM  Result Value Ref Range Status   MRSA by PCR NEGATIVE NEGATIVE Final    Comment:        The GeneXpert MRSA Assay (FDA approved for NASAL specimens only), is one component of  a comprehensive MRSA colonization surveillance program. It is not intended to diagnose MRSA infection nor to guide or monitor treatment for MRSA infections.      Studies: No results found.  Scheduled Meds: . enoxaparin (LOVENOX) injection  40 mg Subcutaneous Q24H  . insulin aspart  0-15 Units Subcutaneous TID WC  . insulin glargine  18 Units Subcutaneous Daily  . pantoprazole (PROTONIX) IV  40 mg Intravenous QHS  . promethazine  25 mg Intravenous 6 times per day  . sodium chloride  1,000 mL Intravenous Once  . sodium chloride  10-40 mL Intracatheter Q12H   Continuous Infusions: . dextrose 5 % and 0.9% NaCl 75 mL/hr at 05/29/15 1055  .  insulin (NOVOLIN-R) infusion Stopped (05/27/15 0935)      Time spent: [place], [place] [facility] Pager [phone]. If 7PM-7AM, please contact night-coverage at [email or web address], password [facility] 05/29/2015, 11:48 AM  LOS: 3 days

## 2015-05-29 NOTE — Progress Notes (Signed)
Patient's BP in the 170s PRN hydralazin given, BP still 169, foley D/C'd patient urinated once small amount bloody urine over 6hrs, stated she is in her period  Dr. Ree Kida, orders written. Will continue to assess patient.

## 2015-05-30 DIAGNOSIS — E876 Hypokalemia: Secondary | ICD-10-CM

## 2015-05-30 DIAGNOSIS — F418 Other specified anxiety disorders: Secondary | ICD-10-CM | POA: Diagnosis not present

## 2015-05-30 DIAGNOSIS — E1143 Type 2 diabetes mellitus with diabetic autonomic (poly)neuropathy: Secondary | ICD-10-CM | POA: Diagnosis not present

## 2015-05-30 DIAGNOSIS — E162 Hypoglycemia, unspecified: Secondary | ICD-10-CM

## 2015-05-30 DIAGNOSIS — E43 Unspecified severe protein-calorie malnutrition: Secondary | ICD-10-CM

## 2015-05-30 LAB — BASIC METABOLIC PANEL
Anion gap: 8 (ref 5–15)
CHLORIDE: 108 mmol/L (ref 101–111)
CO2: 27 mmol/L (ref 22–32)
CREATININE: 0.71 mg/dL (ref 0.44–1.00)
Calcium: 8.4 mg/dL — ABNORMAL LOW (ref 8.9–10.3)
GFR calc Af Amer: >60 mL/min (ref >60)
GFR calc non Af Amer: >60 mL/min (ref >60)
Glucose, Bld: 46 mg/dL — ABNORMAL LOW (ref 65–99)
Potassium: 2.8 mmol/L — ABNORMAL LOW (ref 3.5–5.1)
SODIUM: 143 mmol/L (ref 135–145)

## 2015-05-30 LAB — GLUCOSE, CAPILLARY
GLUCOSE-CAPILLARY: 84 mg/dL (ref 65–99)
GLUCOSE-CAPILLARY: 161 mg/dL — AB (ref 65–99)
GLUCOSE-CAPILLARY: 135 mg/dL — AB (ref 65–99)
Glucose-Capillary: 26 mg/dL — CL (ref 65–99)
Glucose-Capillary: 172 mg/dL — ABNORMAL HIGH (ref 65–99)
Glucose-Capillary: 264 mg/dL — ABNORMAL HIGH (ref 65–99)

## 2015-05-30 LAB — MAGNESIUM: Magnesium: 1.3 mg/dL — ABNORMAL LOW (ref 1.7–2.4)

## 2015-05-30 MED ORDER — POTASSIUM & SODIUM PHOSPHATES 280-160-250 MG PO PACK
1.0000 | PACK | Freq: Three times a day (TID) | ORAL | Status: DC
  Administered 2015-05-30 – 2015-06-05 (×22): 1 via ORAL
  Filled 2015-05-30 (×30): qty 1

## 2015-05-30 MED ORDER — POTASSIUM CHLORIDE 10 MEQ/100ML IV SOLN
10.0000 meq | INTRAVENOUS | Status: AC
  Administered 2015-05-30 (×5): 10 meq via INTRAVENOUS
  Filled 2015-05-30 (×4): qty 100

## 2015-05-30 MED ORDER — LORAZEPAM 2 MG/ML IJ SOLN
2.0000 mg | Freq: Four times a day (QID) | INTRAMUSCULAR | Status: DC
  Administered 2015-05-30 – 2015-06-05 (×19): 2 mg via INTRAVENOUS
  Filled 2015-05-30 (×20): qty 1

## 2015-05-30 MED ORDER — POTASSIUM CHLORIDE 2 MEQ/ML IV SOLN
INTRAVENOUS | Status: DC
  Administered 2015-05-30 – 2015-06-04 (×9): via INTRAVENOUS
  Filled 2015-05-30 (×12): qty 1000

## 2015-05-30 MED ORDER — MAGNESIUM SULFATE 4 GM/100ML IV SOLN
4.0000 g | Freq: Once | INTRAVENOUS | Status: AC
  Administered 2015-05-30: 4 g via INTRAVENOUS
  Filled 2015-05-30: qty 100

## 2015-05-30 MED ORDER — INSULIN ASPART 100 UNIT/ML ~~LOC~~ SOLN
0.0000 [IU] | Freq: Three times a day (TID) | SUBCUTANEOUS | Status: DC
  Administered 2015-05-30: 5 [IU] via SUBCUTANEOUS
  Administered 2015-05-31: 9 [IU] via SUBCUTANEOUS
  Administered 2015-05-31: 3 [IU] via SUBCUTANEOUS
  Administered 2015-06-01: 2 [IU] via SUBCUTANEOUS
  Administered 2015-06-01: 1 [IU] via SUBCUTANEOUS
  Administered 2015-06-01: 7 [IU] via SUBCUTANEOUS
  Administered 2015-06-02: 3 [IU] via SUBCUTANEOUS
  Administered 2015-06-02: 1 [IU] via SUBCUTANEOUS
  Administered 2015-06-02: 9 [IU] via SUBCUTANEOUS
  Administered 2015-06-03: 2 [IU] via SUBCUTANEOUS
  Administered 2015-06-03: 3 [IU] via SUBCUTANEOUS
  Administered 2015-06-03 – 2015-06-04 (×2): 9 [IU] via SUBCUTANEOUS
  Administered 2015-06-04: 3 [IU] via SUBCUTANEOUS
  Administered 2015-06-04: 1 [IU] via SUBCUTANEOUS
  Administered 2015-06-05: 15 [IU] via SUBCUTANEOUS
  Administered 2015-06-05: 5 [IU] via SUBCUTANEOUS

## 2015-05-30 MED ORDER — DEXTROSE 50 % IV SOLN
INTRAVENOUS | Status: AC
  Administered 2015-05-30: 50 mL
  Filled 2015-05-30: qty 50

## 2015-05-30 NOTE — Progress Notes (Signed)
[facility] PROGRESS NOTE  [name] [id number] DOB: 03/06/1966 DOA: 05/26/2015 PCP: Barbette Merino, MD  49 year old female with uncontrolled type 1 diabetes mellitus with severe gastroparesis (being followed by GI at [facility] with plan on gastric stimulator placement by Dr. Toney Rakes with recurrent hospitalization for the past few months with DKA and severe gastroparesis symptoms. (This is her ninth hospitalization since April for similar issue and 3 times already in fast 10 days). Patient discharged 3 days back and return to the ED with multiple episodes of nausea and vomiting associated with abdominal pain since the day prior to admission. Patient reports taking her insulin as instructed and being compliant with recommended diet. Patient found to be severely dehydrated and in DKA. Admitted to stepdown and started on aggressive IV hydration and insulin drip.  Assessment/Plan: DKA with uncontrolled type 1 diabetes mellitus and severe recurrent gastroparesis Admitted to stepdown started on IV insulin with aggressive IV hydration. Anion gap closed. Switched to sliding scale insulin and Lantus. A1c of 7.4. - Hypoglycemia Secondary to poor by mouth intake. fsg in 20s this am requiring D50. Fluids to D5 with normal saline. Discontinued Lantus.  Severe gastroparesis Patient initially followed [facility] but was referred to [facility] as her symptoms was uncontrollable. Patient has had multiple visits to ED with congestive pleuritic symptoms and multiple hospitalizations as well. Intolerant to Reglan (causes a rash and numbness of her tongue). Placed on scheduled Phenergan. When necessary Zofran. Aggressive IV hydration. Monitor I/O. -As per GI note at [facility] from September she is being evaluated by Dr. Toney Rakes for placement of a gastric stimulator. A reports that her insurance has recently been activated. As per GI note the procedure is scheduled in  ?January. -Continue Protonix.  -Nausea continues but no vomiting. Started on 3 days of IV erythromycin on 12/4. Patient is very anxious to eat anything.  Anxiety and depression On Xanax at home. Patient extremely anxious and scared to eat anything. Will make Ativan scheduled.. Resume her antidepression once tolerating by mouth    Anemia Stable.  Hypokalemia and hypomagnesemia Replenish with IV. Has low phosphorous as well . Will add potassium phosphate powder if tolerated.  Essential hypertension with elevated blood pressure Ordered when necessary hydralazine.  Protein calorie malnutrition   DVT prophylaxis: Subcutaneous Lovenox Diet: Clear liquid as tolerated   Code Status: Full code Family Communication: None at bedside Disposition Plan:  Continue  telemetry monitoring. May take several days before symptoms resolve.   Consultants:  None  Procedures:  none  Antibiotics:  None  HPI/Subjective: Patient seen and examined. Was hypoglycemic this morning with fsg in 20s,   given D50 with improvement. Low blood sugar during the night also. Reports being very anxious to eat. Still has nausea and some epigastric discomfort. No vomiting.  Objective: Filed Vitals:   05/29/15 2043 05/30/15 0540  BP: 176/75 147/68  Pulse: 113 96  Temp: 98.6 F (37 C) 97.8 F (36.6 C)  Resp: 16 16    Intake/Output Summary (Last 24 hours) at 05/30/15 1052 Last data filed at 05/30/15 0818  Gross per 24 hour  Intake 3451.25 ml  Output      2 ml  Net 3449.25 ml   Filed Weights   05/28/15 0800  Weight: 56.8 kg (125 lb 3.5 oz)    Exam:   General: Appears fatigued,  HEENT: dry oral mucosa, supple neck  Chest: Clear to auscultation bilaterally  CVS: S1 and S2 tachycardic, no murmurs  GI:  Soft, nondistended, epigastric tenderness, bowel sounds present  Musculoskeletal: Warm, no edema  Data Reviewed: Basic Metabolic Panel:  Recent Labs Lab 05/23/15 1450   05/26/15 1952 05/26/15 2252 05/27/15 0355 05/27/15 0650 05/28/15 0345 05/30/15 0442  NA 138  < >  --  141 137 139 138 143  K 3.7  < >  --  3.1* 3.6 3.7 3.6 2.8*  CL 104  < >  --  112* 107 106 108 108  CO2 20*  < >  --  19* 25 23 19* 27  GLUCOSE 170*  < >  --  276* 156* 174* 233* 46*  BUN 12  < >  --  11 10 8 8  <5*  CREATININE 0.93  < >  --  0.84 0.76 0.73 0.92 0.71  CALCIUM 8.5*  < >  --  7.9* 8.4* 8.8* 8.5* 8.4*  MG 1.4*  --  1.6*  --   --   --   --  1.3*  PHOS 2.6  --  4.1  --  1.7* 2.1*  --   --   < > = values in this interval not displayed. Liver Function Tests:  Recent Labs Lab 05/26/15 1825  AST 25  ALT 25  ALKPHOS 95  BILITOT 1.5*  PROT 7.6  ALBUMIN 4.1   No results for input(s): LIPASE, AMYLASE in the last 168 hours. No results for input(s): AMMONIA in the last 168 hours. CBC:  Recent Labs Lab 05/26/15 1825 05/27/15 0650 05/28/15 0345  WBC 10.1 13.3* 10.4  NEUTROABS  --  10.9*  --   HGB 10.5* 9.8* 9.7*  HCT 32.3* 29.8* 30.6*  MCV 84.8 82.5 86.7  PLT 382 373 393   Cardiac Enzymes: No results for input(s): CKTOTAL, CKMB, CKMBINDEX, TROPONINI in the last 168 hours. BNP (last 3 results) No results for input(s): BNP in the last 8760 hours.  ProBNP (last 3 results) No results for input(s): PROBNP in the last 8760 hours.  CBG:  Recent Labs Lab 05/29/15 1158 05/29/15 1637 05/29/15 2048 05/30/15 0748 05/30/15 0817  GLUCAP 137* 100* 84 26* 161*    Recent Results (from the past 240 hour(s))  MRSA PCR Screening     Status: None   Collection Time: 05/23/15  7:35 AM  Result Value Ref Range Status   MRSA by PCR NEGATIVE NEGATIVE Final    Comment:        The GeneXpert MRSA Assay (FDA approved for NASAL specimens only), is one component of a comprehensive MRSA colonization surveillance program. It is not intended to diagnose MRSA infection nor to guide or monitor treatment for MRSA infections.   MRSA PCR Screening     Status: None   Collection  Time: 05/26/15  9:55 PM  Result Value Ref Range Status   MRSA by PCR NEGATIVE NEGATIVE Final    Comment:        The GeneXpert MRSA Assay (FDA approved for NASAL specimens only), is one component of a comprehensive MRSA colonization surveillance program. It is not intended to diagnose MRSA infection nor to guide or monitor treatment for MRSA infections.      Studies: No results found.  Scheduled Meds: . enoxaparin (LOVENOX) injection  40 mg Subcutaneous Q24H  . erythromycin  3 mg/kg Intravenous Daily  . insulin aspart  0-15 Units Subcutaneous TID WC  . LORazepam  2 mg Intravenous Q6H  . pantoprazole (PROTONIX) IV  40 mg Intravenous QHS  . potassium & sodium phosphates  1 packet  Oral TID WC & HS  . potassium chloride  10 mEq Intravenous Q1 Hr x 5  . promethazine  25 mg Intravenous 6 times per day  . sodium chloride  1,000 mL Intravenous Once  . sodium chloride  10-40 mL Intracatheter Q12H   Continuous Infusions: . dextrose 5 % and 0.9% NaCl 1,000 mL with potassium chloride 40 mEq infusion 100 mL/hr at 05/30/15 1046  . insulin (NOVOLIN-R) infusion Stopped (05/27/15 0935)      Time spent: [place], [place] [facility] Pager [phone]. If 7PM-7AM, please contact night-coverage at [email or web address], password [facility] 05/30/2015, 10:52 AM  LOS: 4 days

## 2015-05-30 NOTE — Progress Notes (Signed)
Inpatient Diabetes Program Recommendations  AACE/ADA: New Consensus Statement on Inpatient Glycemic Control (2015)  Target Ranges:  Prepandial:   less than 140 mg/dL      Peak postprandial:   less than 180 mg/dL (1-2 hours)      Critically ill patients:  140 - 180 mg/dL    Results for [name], [name] (MRN [id number]) as of 05/30/2015 08:53  Ref. Range 05/28/2015 08:02 05/28/2015 13:05 05/28/2015 16:48 05/28/2015 21:30  Glucose-Capillary Latest Ref Range: 65-99 mg/dL 342 (H) 140 (H) 93 75    Results for [name], [name] (MRN [id number]) as of 05/30/2015 08:53  Ref. Range 05/29/2015 06:11 05/29/2015 07:43 05/29/2015 11:58 05/29/2015 16:37 05/29/2015 20:48  Glucose-Capillary Latest Ref Range: 65-99 mg/dL 144 (H) 176 (H) 137 (H) 100 (H) 84    Results for [name], [name] (MRN [id number]) as of 05/30/2015 08:53  Ref. Range 05/30/2015 07:48 05/30/2015 08:17  Glucose-Capillary Latest Ref Range: 65-99 mg/dL 26 (LL) 161 (H)    Home DM Meds: Levemir 15 units QHS       Novolog 0-15 units tid per SSI  Current Insulin Orders: Novolog Moderate SSI (0-15 units) TID AC     MD- Levemir dose was increased to 18 units daily yesterday (12/04).  Per records, patient only takes Levemir 15 units daily at home.  Hypoglycemic this AM (CBG 26 mg/dl on CBG meter).  Levemir discontinued completely.  Patient will need a smaller dose of Levemir added back to regimen.  Per records, patient has Type 1 DM and will need some basal insulin to keep her CBGs under control and to prevent reoccurrence of DKA.  MD- Please consider adding back home dose of Levemir 15 units daily today.      --Will follow patient during hospitalization--  Wyn Quaker RN, MSN, CDE Diabetes Coordinator Inpatient Glycemic Control Team Team Pager: [phone] (8a-5p)

## 2015-05-30 NOTE — Progress Notes (Signed)
Hypoglycemic Event  CBG: 26  Treatment: D50 IV 50 mL  Symptoms: None  Follow-up CBG: Time: 0815 CBG Result: 161  Possible Reasons for Event: Clear Diet and pt not eating; NS as maintenance fluids  Comments/MD notified:Paged Dhungel, No return call    [name], [name]

## 2015-05-31 ENCOUNTER — Encounter (HOSPITAL_COMMUNITY): Payer: Self-pay | Admitting: Radiology

## 2015-05-31 ENCOUNTER — Other Ambulatory Visit (HOSPITAL_COMMUNITY): Payer: 59

## 2015-05-31 ENCOUNTER — Ambulatory Visit (HOSPITAL_COMMUNITY): Payer: 59

## 2015-05-31 DIAGNOSIS — E1143 Type 2 diabetes mellitus with diabetic autonomic (poly)neuropathy: Secondary | ICD-10-CM

## 2015-05-31 DIAGNOSIS — G43A1 Cyclical vomiting, intractable: Secondary | ICD-10-CM | POA: Diagnosis not present

## 2015-05-31 DIAGNOSIS — F418 Other specified anxiety disorders: Secondary | ICD-10-CM | POA: Diagnosis not present

## 2015-05-31 DIAGNOSIS — K3184 Gastroparesis: Secondary | ICD-10-CM | POA: Diagnosis not present

## 2015-05-31 DIAGNOSIS — E1010 Type 1 diabetes mellitus with ketoacidosis without coma: Principal | ICD-10-CM

## 2015-05-31 LAB — HEPATIC FUNCTION PANEL
ALBUMIN: 3.1 g/dL — AB (ref 3.5–5.0)
ALT: 16 U/L (ref 14–54)
AST: 17 U/L (ref 15–41)
Alkaline Phosphatase: 77 U/L (ref 38–126)
Bilirubin, Direct: <0.1 mg/dL — ABNORMAL LOW (ref 0.1–0.5)
TOTAL PROTEIN: 6.3 g/dL — AB (ref 6.5–8.1)
Total Bilirubin: 0.8 mg/dL (ref 0.3–1.2)

## 2015-05-31 LAB — LIPASE, BLOOD: LIPASE: 23 U/L (ref 11–51)

## 2015-05-31 LAB — GLUCOSE, CAPILLARY
GLUCOSE-CAPILLARY: 356 mg/dL — AB (ref 65–99)
GLUCOSE-CAPILLARY: 92 mg/dL (ref 65–99)
GLUCOSE-CAPILLARY: 97 mg/dL (ref 65–99)
Glucose-Capillary: 213 mg/dL — ABNORMAL HIGH (ref 65–99)
Glucose-Capillary: 64 mg/dL — ABNORMAL LOW (ref 65–99)

## 2015-05-31 LAB — BASIC METABOLIC PANEL
ANION GAP: 6 (ref 5–15)
BUN: <5 mg/dL — ABNORMAL LOW (ref 6–20)
CALCIUM: 8.7 mg/dL — AB (ref 8.9–10.3)
CHLORIDE: 109 mmol/L (ref 101–111)
CO2: 25 mmol/L (ref 22–32)
CREATININE: 0.82 mg/dL (ref 0.44–1.00)
GFR calc non Af Amer: >60 mL/min (ref >60)
Glucose, Bld: 239 mg/dL — ABNORMAL HIGH (ref 65–99)
Potassium: 4 mmol/L (ref 3.5–5.1)
SODIUM: 140 mmol/L (ref 135–145)

## 2015-05-31 MED ORDER — HYDROMORPHONE HCL 1 MG/ML IJ SOLN: 1.0000 mg | Freq: Four times a day (QID) | INTRAMUSCULAR | Status: DC | PRN

## 2015-05-31 MED ORDER — ONDANSETRON HCL 4 MG/2ML IJ SOLN
4.0000 mg | Freq: Four times a day (QID) | INTRAMUSCULAR | Status: DC
  Administered 2015-05-31 – 2015-06-05 (×20): 4 mg via INTRAVENOUS
  Filled 2015-05-31 (×20): qty 2

## 2015-05-31 MED ORDER — PANTOPRAZOLE SODIUM 40 MG IV SOLR
40.0000 mg | Freq: Two times a day (BID) | INTRAVENOUS | Status: DC
  Administered 2015-05-31 – 2015-06-05 (×10): 40 mg via INTRAVENOUS
  Filled 2015-05-31 (×10): qty 40

## 2015-05-31 MED ORDER — HYDROMORPHONE HCL 1 MG/ML IJ SOLN
0.5000 mg | Freq: Four times a day (QID) | INTRAMUSCULAR | Status: DC | PRN
  Administered 2015-06-01: 0.5 mg via INTRAVENOUS
  Filled 2015-05-31: qty 1

## 2015-05-31 MED ORDER — IOHEXOL 300 MG/ML  SOLN: 25.0000 mL | INTRAMUSCULAR | Status: AC

## 2015-05-31 MED ORDER — IOHEXOL 300 MG/ML  SOLN
100.0000 mL | Freq: Once | INTRAMUSCULAR | Status: AC | PRN
  Administered 2015-05-31: 100 mL via INTRAVENOUS

## 2015-05-31 MED ORDER — INSULIN DETEMIR 100 UNIT/ML ~~LOC~~ SOLN
8.0000 [IU] | Freq: Every day | SUBCUTANEOUS | Status: DC
  Administered 2015-05-31: 8 [IU] via SUBCUTANEOUS
  Filled 2015-05-31 (×2): qty 0.08

## 2015-05-31 NOTE — Consult Note (Signed)
Consultation  Referring Provider: [facility] Primary Care Physician:  Barbette Merino, MD Primary Gastroenterologist:  Unassigned: Dr. Derrill Kay - [facility] and Dr Alonza Bogus [facility]  Reason for Consultation:  Diabetic gastroparesis  HPI: [name] is a 49 y.o. female admitted on 05/28/2015 with recurrent intractable nausea and vomiting and DKA. This is patient's ninth admission this year, and fourth admission over the past few weeks. Each time over the past month she has had DKA. She is a type I diabetic with diabetic neuropathy, anxiety/depression, hypertension, and long-standing diabetic gastroparesis. She had been seen by Dr. Deatra Ina several years ago and was ultimately referred to Dr. Derrill Kay at [facility] for refractory gastroparesis. She is intolerant to Reglan. She has been on a variety of anti-emetics, currently using Zofran primarily at home. She is also on Marinol 3 times a day. She says initially the seem to help but hasn't been helpful over the past few months. She was referred to Dr. Toney Rakes at [facility] for placement of a gastric stimulator but is waiting for insurance approval for this to be placed in January. She cannot tell me any changes to her regimen to account for worsening's of symptoms over the past month. Her last hemoglobin A1c was 7.4, but she has had repeated DKA. Gastric emptying scan done at [facility] earlier this year was abnormal at 2 and 4 hours with four-hour measurement showing 59% retention. Patient currently on a combination of Zofran and Phenergan and has also been started on IV erythromycin. She does have Dilaudid ordered as well. CT scan ordered today is pending.   Past Medical History  Diagnosis Date  . Diabetes mellitus without complication ([place])   . Gastroparesis     Past Surgical History  Procedure Laterality Date  . Retinal detachment surgery    . Posterior vitrectomy and membrane peel-left eye  09/28/2002  . Posterior vitrectomy and  membrane peel-right eye  03/16/2002  . Eye surgery    . Gailstones    . Esophagogastroduodenoscopy  09/27/2014    at [facility], Dr Rolan Lipa. biospy neg for celiac, neg for H pylori.   . Colonoscopy  09/27/2014    at [facility]    Prior to Admission medications   Medication Sig Start Date End Date Taking? Authorizing Provider  ALPRAZolam (XANAX) 0.25 MG tablet Take 1 tablet (0.25 mg total) by mouth 3 (three) times daily as needed for anxiety. Patient taking differently: Take 0.25 mg by mouth at bedtime as needed for sleep.  05/20/15  Yes Robbie Lis, MD  dronabinol (MARINOL) 10 MG capsule Take 10 mg by mouth 2 (two) times daily. 02/04/15  Yes Historical Provider, MD  ferrous sulfate 325 (65 FE) MG tablet Take 325 mg by mouth 3 (three) times a week.    Yes Historical Provider, MD  FLUoxetine (PROZAC) 40 MG capsule Take 40 mg by mouth daily.   Yes Historical Provider, MD  furosemide (LASIX) 40 MG tablet Take 40 mg by mouth daily.   Yes Historical Provider, MD  insulin aspart (NOVOLOG) 100 UNIT/ML injection Inject 0-15 Units into the skin 3 (three) times daily with meals. 6-15 units three times daily with food.  Per sliding scale Patient taking differently: Inject 0-15 Units into the skin 3 (three) times daily with meals. 2-15 units three times daily with food. Per sliding scale 03/12/15  Yes Belkys A Regalado, MD  insulin detemir (LEVEMIR) 100 UNIT/ML injection Inject 0.15 mLs (15 Units total) into the skin at bedtime. 02/17/13  Yes  Robbie Lis, MD  lisinopril (PRINIVIL,ZESTRIL) 10 MG tablet Take 10 mg by mouth daily. 10/13/14  Yes Historical Provider, MD  ondansetron (ZOFRAN ODT) 4 MG disintegrating tablet Take 1 tablet (4 mg total) by mouth every 8 (eight) hours as needed for nausea. 05/20/15  Yes Robbie Lis, MD  pantoprazole (PROTONIX) 40 MG tablet Take 40 mg by mouth daily.  01/04/15  Yes Historical Provider, MD  pregabalin (LYRICA) 150 MG capsule Take 1 capsule (150 mg total) by mouth 2 (two)  times daily. 02/17/13  Yes Robbie Lis, MD  promethazine (PHENERGAN) 25 MG suppository Place 1 suppository (25 mg total) rectally every 6 (six) hours as needed for nausea or vomiting. 04/27/15  Yes Samantha Tripp Dowless, PA-C  promethazine (PHENERGAN) 25 MG tablet Take 1 tablet (25 mg total) by mouth every 6 (six) hours as needed for nausea or vomiting. 04/27/15  Yes Samantha Tripp Dowless, PA-C  potassium chloride (K-DUR) 10 MEQ tablet Take 1 tablet (10 mEq total) by mouth daily. 04/27/15   Samantha Tripp Dowless, PA-C    Current Facility-Administered Medications  Medication Dose Route Frequency Provider Last Rate Last Dose  . bisacodyl (DULCOLAX) suppository 10 mg  10 mg Rectal Daily PRN Reubin Milan, MD      . dextrose 5 % and 0.9% NaCl 1,000 mL with potassium chloride 40 mEq infusion   Intravenous Continuous Nishant Dhungel, MD 100 mL/hr at 05/31/15 [id number]    . enoxaparin (LOVENOX) injection 40 mg  40 mg Subcutaneous Q24H Nishant Dhungel, MD   40 mg at 05/30/15 1724  . hydrALAZINE (APRESOLINE) injection 10 mg  10 mg Intravenous Q4H PRN Gardiner Barefoot, NP   10 mg at 05/29/15 1627  . HYDROmorphone (DILAUDID) injection 1 mg  1 mg Intravenous Q4H PRN Reubin Milan, MD   1 mg at 05/30/15 1052  . insulin aspart (novoLOG) injection 0-9 Units  0-9 Units Subcutaneous TID WC Nishant Dhungel, MD   3 Units at 05/31/15 1236  . insulin detemir (LEVEMIR) injection 8 Units  8 Units Subcutaneous Daily Nishant Dhungel, MD   8 Units at 05/31/15 [phone]  . LORazepam (ATIVAN) injection 2 mg  2 mg Intravenous Q6H Nishant Dhungel, MD   2 mg at 05/31/15 0815  . ondansetron (ZOFRAN) injection 4 mg  4 mg Intravenous Q4H PRN Nishant Dhungel, MD   4 mg at 05/28/15 0400  . pantoprazole (PROTONIX) injection 40 mg  40 mg Intravenous QHS Reubin Milan, MD   40 mg at 05/30/15 2112  . potassium & sodium phosphates (PHOS-NAK) 280-160-250 MG packet 1 packet  1 packet Oral TID WC & HS Nishant Dhungel, MD   1 packet  at 05/31/15 1236  . promethazine (PHENERGAN) injection 25 mg  25 mg Intravenous 6 times per day Nishant Dhungel, MD   25 mg at 05/31/15 0815  . sodium chloride 0.9 % bolus 1,000 mL  1,000 mL Intravenous Once Tanna Furry, MD      . sodium chloride 0.9 % injection 10-40 mL  10-40 mL Intracatheter Q12H Tanna Furry, MD   10 mL at 05/28/15 2134  . sodium chloride 0.9 % injection 10-40 mL  10-40 mL Intracatheter PRN Tanna Furry, MD   10 mL at 05/31/15 0343    Allergies as of 05/26/2015 - Review Complete 05/26/2015  Allergen Reaction Noted  . Anesthetics, amide Nausea And Vomiting 03/21/2012  . Penicillins Diarrhea and Nausea And Vomiting 10/20/2014  . Buprenorphine hcl Rash 02/08/2015  . Encainide  Nausea And Vomiting 02/08/2015  . Metoclopramide Rash 02/08/2015    Family History  Problem Relation Age of Onset  . Cystic fibrosis Mother   . Hypertension Father   . Diabetes Brother   . Hypertension Maternal Grandmother     Social History   Social History  . Marital Status: Single    Spouse Name: N/A  . Number of Children: N/A  . Years of Education: N/A   Occupational History  . Not on file.   Social History Main Topics  . Smoking status: Never Smoker   . Smokeless tobacco: Never Used  . Alcohol Use: No  . Drug Use: No  . Sexual Activity: Not Currently   Other Topics Concern  . Not on file   Social History Narrative    Review of Systems: Pertinent positive and negative review of systems were noted in the above HPI section.  All other review of systems was otherwise negative.  Physical Exam: Vital signs in last 24 hours: Temp:  [98.3 F (36.8 C)-99.3 F (37.4 C)] 98.7 F (37.1 C) (12/06 1253) Pulse Rate:  [92-114] 109 (12/06 1253) Resp:  [14-20] 20 (12/06 1253) BP: (126-174)/(72-86) 174/86 mmHg (12/06 1253) SpO2:  [99 %-100 %] 99 % (12/06 1253) Last BM Date: 05/29/15 (per pt) General:   Alert, well-developed, chronically ill appearing AA female, cooperative in NAD,  somnolent Head:  Normocephalic and atraumatic. Eyes:  Sclera clear, no icterus.   Conjunctiva pink. Ears:  Normal auditory acuity. Nose:  No deformity, discharge,  or lesions. Mouth:  No deformity or lesions. Mucosa dry  Neck:  Supple; no masses or thyromegaly. Lungs:  Clear throughout to auscultation.   No wheezes, crackles, or rhonchi.  Heart:  Regular rate and rhythm; no murmurs, clicks, rubs,  or gallops. Abdomen:  Soft,mild upper abdominal  tenderness, BS active,nonpalp mass or hsm.   Rectal:  Deferred  Msk:  Symmetrical without gross deformities. . Pulses:  Normal pulses noted. Extremities:  Without clubbing or edema. Neurologic:  Alert and  oriented x4;  grossly normal neurologically. Skin:  Intact without significant lesions or rashes.. Psych:  Somnelent, cooperative. Normal mood and affect.  Intake/Output from previous day: 12/05 0701 - 12/06 0700 In: 2535 [P.O.:60; I.V.:2000; IV Piggyback:475] Out: 625 [Urine:625] Intake/Output this shift: Total I/O In: 0  Out: 500 [Urine:500]  Lab Results: No results for input(s): WBC, HGB, HCT, PLT in the last 72 hours. BMET  Recent Labs  05/30/15 0442 05/31/15 0343  NA 143 140  K 2.8* 4.0  CL 108 109  CO2 27 25  GLUCOSE 46* 239*  BUN <5* <5*  CREATININE 0.71 0.82  CALCIUM 8.4* 8.7*   LFT  Recent Labs  05/31/15 0343  PROT 6.3*  ALBUMIN 3.1*  AST 17  ALT 16  ALKPHOS 35  BILITOT 0.8  BILIDIR <0.1*  IBILI NOT CALCULATED     IMPRESSION:  #1 49 yo AA female type 1 diabetic with multiple recent admits for DKA #2 Refractory diabetic gastroparesis- intractable sxs usually in conjunction with DKA #3 CKD #4 chronic anemia   PLAN: #1 Stop Dilaudid - slowing gastric emptying- have decreased dose by half and lengthened interval today-d/c tomorrow #2 Stop marinol - not helping and can be associated with n/v and abdominal pain #3 Continue IV emycin, and may benefit from oral emycin on discharge #4 Needs around the  clock antiemetic continued on discharge- may try Compazine 5-10 mg q 6 hours.  Changed to Zofran  4 mg q 6  IV for now #5 Endocrinology consult on outpt basis perhaps at [facility] as established there as avoiding DKA and optimizing diabetic control will help gastroparesis more than anything #6 back to Dr Koch/Dr Toney Rakes to move forward with gastric stimulator.  CT has resulted- has bilateral  hydronephrosis from chronically distended bladder- ?neurogenic bladder- consider urology consult    Amy [place]  05/31/2015, 3:41 PM      Attending physician's note   I have taken a history, examined the patient and reviewed the chart. I agree with the Advanced Practitioner's note, impression and recommendations. DKA and refractory diabetic gastroparesis awaiting gastric stimulator. Taper and stop dilaudid. Stop marinol. Continue Emycin and RTC antiemetics. Presumed GERD-PPI bid for now. Optimal mgmt of DM. If N/V does not resolve with above in next 2-3 days would contact [facility] about inpatient transfer. Outpatient GI follow up with Dr. Alonza Bogus in [facility] and Dr. Kenard Gower Toney Rakes at [facility].  Lucio Edward, MD [facility] [phone] Mon-Fri 8a-5p [phone] after 5p, weekends, holidays

## 2015-05-31 NOTE — Progress Notes (Signed)
[facility] PROGRESS NOTE  [name] [id number] DOB: 20-Oct-1965 DOA: 05/26/2015 PCP: Barbette Merino, MD  49 year old female with uncontrolled type 1 diabetes mellitus with severe gastroparesis (being followed by GI at [facility] with plan on gastric stimulator placement by Dr. Toney Rakes with recurrent hospitalization for the past few months with DKA and severe gastroparesis symptoms. (This is her ninth hospitalization since April for similar issue and 3 times already in fast 10 days). Patient discharged 3 days back and return to the ED with multiple episodes of nausea and vomiting associated with abdominal pain since the day prior to admission. Patient reports taking her insulin as instructed and being compliant with recommended diet. Patient found to be severely dehydrated and in DKA. Admitted to stepdown and started on aggressive IV hydration and insulin drip.  Assessment/Plan:  Severe persistent gastroparesis Patient initially followed by [facility] but was referred to [facility] as her symptoms was uncontrollable. Patient has had multiple visits to ED with congestive pleuritic symptoms and multiple hospitalizations as well. Intolerant to Reglan (causes a rash and numbness of her tongue). Placed on scheduled Phenergan. When necessary Zofran. Aggressive IV hydration. Monitor I/O. -As per GI note at [facility] from September she is being evaluated by Dr. Toney Rakes for placement of a gastric stimulator. A reports that her insurance has recently been activated. As per GI note the procedure is scheduled in ?January. -Continue Protonix.  -Nausea continues but no vomiting. Started on 3 days of IV erythromycin on 12/4. Patient is very anxious to eat anything. -Continue scheduled Phenergan and Zofran. Patient has abdominal pain on exam. Will check CT of the abdomen with contrast. LFTs and lipase normal. I will consult GI if symptoms still persistent tomorrow.  DKA with  uncontrolled type 1 diabetes mellitus and severe recurrent gastroparesis Resolved with IV insulin and glucose stabilizer protocol.  Hypoglycemia Secondary to poor by mouth intake.  Receiving D5 with normal saline with low-dose Levemir.    Anxiety and depression On Xanax at home. Patient extremely anxious and scared to eat anything. Continue scheduled Ativan... Resume her antidepression once tolerating by mouth   Anemia Stable.  Hypokalemia and hypomagnesemia Replenish with IV. Has low phosphorous as well .add potassium phosphate powder if tolerated.  Essential hypertension with elevated blood pressure Ordered when necessary hydralazine.  Protein calorie malnutrition   DVT prophylaxis: Subcutaneous Lovenox Diet: Clear liquid as tolerated   Code Status: Full code Family Communication: None at bedside Disposition Plan:  Continue  telemetry monitoring. May take several days before symptoms resolve.   Consultants:  None  Procedures:  none  Antibiotics:  None  HPI/Subjective: Patient seen and examined. Was hypoglycemic this morning with fsg in 20s,   given D50 with improvement. Low blood sugar during the night also. Reports being very anxious to eat. Still has nausea and some epigastric discomfort. No vomiting.  Objective: Filed Vitals:   05/31/15 0521 05/31/15 1253  BP: 158/85 174/86  Pulse: 114 109  Temp: 98.3 F (36.8 C) 98.7 F (37.1 C)  Resp: 18 20    Intake/Output Summary (Last 24 hours) at 05/31/15 1508 Last data filed at 05/31/15 1100  Gross per 24 hour  Intake   1600 ml  Output    500 ml  Net   1100 ml   Filed Weights   05/28/15 0800  Weight: 56.8 kg (125 lb 3.5 oz)    Exam:   General: Appears fatigued,  HEENT: dry oral mucosa, supple neck  Chest: Clear to  auscultation bilaterally  CVS: S1 and S2 tachycardic, no murmurs  GI: Soft, nondistended, epigastric tenderness, bowel sounds present  Musculoskeletal: Warm, no edema  Data  Reviewed: Basic Metabolic Panel:  Recent Labs Lab 05/26/15 1952  05/27/15 0355 05/27/15 0650 05/28/15 0345 05/30/15 0442 05/31/15 0343  NA  --   < > 137 139 138 143 140  K  --   < > 3.6 3.7 3.6 2.8* 4.0  CL  --   < > 107 106 108 108 109  CO2  --   < > 25 23 19* 27 25  GLUCOSE  --   < > 156* 174* 233* 46* 239*  BUN  --   < > 10 8 8  <5* <5*  CREATININE  --   < > 0.76 0.73 0.92 0.71 0.82  CALCIUM  --   < > 8.4* 8.8* 8.5* 8.4* 8.7*  MG 1.6*  --   --   --   --  1.3*  --   PHOS 4.1  --  1.7* 2.1*  --   --   --   < > = values in this interval not displayed. Liver Function Tests:  Recent Labs Lab 05/26/15 1825 05/31/15 0343  AST 25 17  ALT 25 16  ALKPHOS 95 77  BILITOT 1.5* 0.8  PROT 7.6 6.3*  ALBUMIN 4.1 3.1*    Recent Labs Lab 05/31/15 0343  LIPASE 23   No results for input(s): AMMONIA in the last 168 hours. CBC:  Recent Labs Lab 05/26/15 1825 05/27/15 0650 05/28/15 0345  WBC 10.1 13.3* 10.4  NEUTROABS  --  10.9*  --   HGB 10.5* 9.8* 9.7*  HCT 32.3* 29.8* 30.6*  MCV 84.8 82.5 86.7  PLT 382 373 393   Cardiac Enzymes: No results for input(s): CKTOTAL, CKMB, CKMBINDEX, TROPONINI in the last 168 hours. BNP (last 3 results) No results for input(s): BNP in the last 8760 hours.  ProBNP (last 3 results) No results for input(s): PROBNP in the last 8760 hours.  CBG:  Recent Labs Lab 05/30/15 1142 05/30/15 1629 05/30/15 2042 05/31/15 0753 05/31/15 1159  GLUCAP 172* 264* 135* 356* 213*    Recent Results (from the past 240 hour(s))  MRSA PCR Screening     Status: None   Collection Time: 05/23/15  7:35 AM  Result Value Ref Range Status   MRSA by PCR NEGATIVE NEGATIVE Final    Comment:        The GeneXpert MRSA Assay (FDA approved for NASAL specimens only), is one component of a comprehensive MRSA colonization surveillance program. It is not intended to diagnose MRSA infection nor to guide or monitor treatment for MRSA infections.   MRSA PCR  Screening     Status: None   Collection Time: 05/26/15  9:55 PM  Result Value Ref Range Status   MRSA by PCR NEGATIVE NEGATIVE Final    Comment:        The GeneXpert MRSA Assay (FDA approved for NASAL specimens only), is one component of a comprehensive MRSA colonization surveillance program. It is not intended to diagnose MRSA infection nor to guide or monitor treatment for MRSA infections.      Studies: No results found.  Scheduled Meds: . enoxaparin (LOVENOX) injection  40 mg Subcutaneous Q24H  . insulin aspart  0-9 Units Subcutaneous TID WC  . insulin detemir  8 Units Subcutaneous Daily  . iohexol  25 mL Oral Q1 Hr x 2  . LORazepam  2 mg Intravenous  Q6H  . pantoprazole (PROTONIX) IV  40 mg Intravenous QHS  . potassium & sodium phosphates  1 packet Oral TID WC & HS  . promethazine  25 mg Intravenous 6 times per day  . sodium chloride  1,000 mL Intravenous Once  . sodium chloride  10-40 mL Intracatheter Q12H   Continuous Infusions: . dextrose 5 % and 0.9% NaCl 1,000 mL with potassium chloride 40 mEq infusion 100 mL/hr at 05/31/15 [id number]      Time spent: [place], [place]  [facility] Pager [phone]. If 7PM-7AM, please contact night-coverage at [email or web address], password [facility] 05/31/2015, 3:08 PM  LOS: 5 days

## 2015-06-01 DIAGNOSIS — K3184 Gastroparesis: Secondary | ICD-10-CM | POA: Diagnosis not present

## 2015-06-01 DIAGNOSIS — E1010 Type 1 diabetes mellitus with ketoacidosis without coma: Secondary | ICD-10-CM | POA: Diagnosis not present

## 2015-06-01 DIAGNOSIS — F418 Other specified anxiety disorders: Secondary | ICD-10-CM | POA: Diagnosis not present

## 2015-06-01 DIAGNOSIS — E1143 Type 2 diabetes mellitus with diabetic autonomic (poly)neuropathy: Secondary | ICD-10-CM | POA: Diagnosis not present

## 2015-06-01 LAB — BASIC METABOLIC PANEL
ANION GAP: 4 — AB (ref 5–15)
BUN: <5 mg/dL — ABNORMAL LOW (ref 6–20)
CHLORIDE: 111 mmol/L (ref 101–111)
CO2: 27 mmol/L (ref 22–32)
CREATININE: 0.87 mg/dL (ref 0.44–1.00)
Calcium: 8.6 mg/dL — ABNORMAL LOW (ref 8.9–10.3)
GFR calc non Af Amer: >60 mL/min (ref >60)
Glucose, Bld: 126 mg/dL — ABNORMAL HIGH (ref 65–99)
POTASSIUM: 4.7 mmol/L (ref 3.5–5.1)
SODIUM: 142 mmol/L (ref 135–145)

## 2015-06-01 LAB — GLUCOSE, CAPILLARY
GLUCOSE-CAPILLARY: 140 mg/dL — AB (ref 65–99)
GLUCOSE-CAPILLARY: 307 mg/dL — AB (ref 65–99)
GLUCOSE-CAPILLARY: 186 mg/dL — AB (ref 65–99)
Glucose-Capillary: 72 mg/dL (ref 65–99)

## 2015-06-01 LAB — PHOSPHORUS: PHOSPHORUS: 3.2 mg/dL (ref 2.5–4.6)

## 2015-06-01 LAB — MAGNESIUM: MAGNESIUM: 1.6 mg/dL — AB (ref 1.7–2.4)

## 2015-06-01 MED ORDER — MAGNESIUM SULFATE 4 GM/100ML IV SOLN
4.0000 g | Freq: Once | INTRAVENOUS | Status: AC
  Administered 2015-06-01: 4 g via INTRAVENOUS
  Filled 2015-06-01: qty 100

## 2015-06-01 MED ORDER — INSULIN DETEMIR 100 UNIT/ML ~~LOC~~ SOLN
5.0000 [IU] | Freq: Every day | SUBCUTANEOUS | Status: DC
  Administered 2015-06-01 – 2015-06-03 (×3): 5 [IU] via SUBCUTANEOUS
  Filled 2015-06-01 (×3): qty 0.05

## 2015-06-01 NOTE — Progress Notes (Signed)
[facility] PROGRESS NOTE  [name] [id number] DOB: 02/07/1966 DOA: 05/26/2015 PCP: Barbette Merino, MD  49 year old female with uncontrolled type 1 diabetes mellitus with severe gastroparesis (being followed by GI Dr Derrill Kay at [facility] with plan on gastric stimulator placement by Dr. Toney Rakes with recurrent hospitalization for the past few months with DKA and severe gastroparesis symptoms. (This is her ninth hospitalization since April for similar issue and 3 times already in fast 10 days). Patient discharged 3 days back and return to the ED with multiple episodes of nausea and vomiting associated with abdominal pain since the day prior to admission. Patient reports taking her insulin as instructed and being compliant with recommended diet. Patient found to be severely dehydrated and in DKA. Admitted to stepdown and started on aggressive IV hydration and insulin drip.  Assessment/Plan: Severe gastroparesis following with GI at [facility] given recurrent  uncontrollable symptoms.  Patient has had multiple visits to ED with gastroparetic symptoms and multiple hospitalizations as well. Intolerant to Reglan (causes a rash and numbness of her tongue). Placed on scheduled Phenergan. When necessary Zofran. continue hydration.  -As per GI note at [facility] from September she is being evaluated by Dr. Toney Rakes for placement of a gastric stimulator. A reports that her insurance has recently been activated. As per GI note the procedure is scheduled in ?January. -Continue Protonix.  -Nausea continues but no vomiting. Received  3 days of IV erythromycin . Patient is very anxious to eat anything. -try full liquid today. dced pain meds and marinol. -GI following. recommend to continue current management . If no further improvement in 1-2 days recommend to trasnfer her to [facility].  DKA with uncontrolled type 1 diabetes mellitus and severe recurrent gastroparesis Admitted to stepdown started on  IV insulin with aggressive IV hydration. Anion gap closed. Switched to sliding scale insulin and Lantus. A1c of 7.4. - Hypoglycemia Secondary to poor by mouth intake. Low fsg requiring D50. Stable at present. On low dose lantus.   Anxiety and depression On Xanax at home. Patient extremely anxious and scared to eat anything. Placed on  scheduled ativan.Marland Kitchen Resume her antidepression once tolerating by mouth.   Anemia Stable.  Hypokalemia and hypomagnesemia Replenished. Has low phosphorous as well . Added  potassium phosphate powder.  Essential hypertension with elevated blood pressure Ordered when necessary hydralazine.  Protein calorie malnutrition, severe   DVT prophylaxis: Subcutaneous Lovenox Diet: full liquid as tolerated   Code Status: Full code Family Communication: None at bedside Disposition Plan:  Continue  telemetry monitoring. May take several days before symptoms resolve.will  need to transfer to [facility] if not improved in next 1-2 days   Consultants:  [facility]  Procedures:  none  Antibiotics:  None  HPI/Subjective: Patient seen and examined. Reports wanting to eat today. Still nauseaous  Objective: Filed Vitals:   06/01/15 0531 06/01/15 1017  BP: 136/67 135/64  Pulse: 91 87  Temp: 98.2 F (36.8 C) 98.4 F (36.9 C)  Resp: 18 20    Intake/Output Summary (Last 24 hours) at 06/01/15 1338 Last data filed at 06/01/15 0533  Gross per 24 hour  Intake   1220 ml  Output   1650 ml  Net   -430 ml   Filed Weights   05/28/15 0800  Weight: 56.8 kg (125 lb 3.5 oz)    Exam:   General: Appears fatigued,poorly communicates  HEENT: dry oral mucosa, supple neck  Chest: Clear to auscultation bilaterally  CVS: S1 and S2 tachycardic, no murmurs  GI: Soft, nondistended, no epigastric tenderness, bowel sounds present  Musculoskeletal: Warm, no edema  Data Reviewed: Basic Metabolic Panel:  Recent Labs Lab 05/26/15 1952  05/27/15 0355  05/27/15 0650 05/28/15 0345 05/30/15 0442 05/31/15 0343 06/01/15 0645  NA  --   < > 137 139 138 143 140 142  K  --   < > 3.6 3.7 3.6 2.8* 4.0 4.7  CL  --   < > 107 106 108 108 109 111  CO2  --   < > 25 23 19* 27 25 27   GLUCOSE  --   < > 156* 174* 233* 46* 239* 126*  BUN  --   < > 10 8 8  <5* <5* <5*  CREATININE  --   < > 0.76 0.73 0.92 0.71 0.82 0.87  CALCIUM  --   < > 8.4* 8.8* 8.5* 8.4* 8.7* 8.6*  MG 1.6*  --   --   --   --  1.3*  --  1.6*  PHOS 4.1  --  1.7* 2.1*  --   --   --  3.2  < > = values in this interval not displayed. Liver Function Tests:  Recent Labs Lab 05/26/15 1825 05/31/15 0343  AST 25 17  ALT 25 16  ALKPHOS 95 77  BILITOT 1.5* 0.8  PROT 7.6 6.3*  ALBUMIN 4.1 3.1*    Recent Labs Lab 05/31/15 0343  LIPASE 23   No results for input(s): AMMONIA in the last 168 hours. CBC:  Recent Labs Lab 05/26/15 1825 05/27/15 0650 05/28/15 0345  WBC 10.1 13.3* 10.4  NEUTROABS  --  10.9*  --   HGB 10.5* 9.8* 9.7*  HCT 32.3* 29.8* 30.6*  MCV 84.8 82.5 86.7  PLT 382 373 393   Cardiac Enzymes: No results for input(s): CKTOTAL, CKMB, CKMBINDEX, TROPONINI in the last 168 hours. BNP (last 3 results) No results for input(s): BNP in the last 8760 hours.  ProBNP (last 3 results) No results for input(s): PROBNP in the last 8760 hours.  CBG:  Recent Labs Lab 05/31/15 1714 05/31/15 2054 05/31/15 2209 06/01/15 0731 06/01/15 1203  GLUCAP 92 64* 97 140* 307*    Recent Results (from the past 240 hour(s))  MRSA PCR Screening     Status: None   Collection Time: 05/23/15  7:35 AM  Result Value Ref Range Status   MRSA by PCR NEGATIVE NEGATIVE Final    Comment:        The GeneXpert MRSA Assay (FDA approved for NASAL specimens only), is one component of a comprehensive MRSA colonization surveillance program. It is not intended to diagnose MRSA infection nor to guide or monitor treatment for MRSA infections.   MRSA PCR Screening     Status: None    Collection Time: 05/26/15  9:55 PM  Result Value Ref Range Status   MRSA by PCR NEGATIVE NEGATIVE Final    Comment:        The GeneXpert MRSA Assay (FDA approved for NASAL specimens only), is one component of a comprehensive MRSA colonization surveillance program. It is not intended to diagnose MRSA infection nor to guide or monitor treatment for MRSA infections.      Studies: Ct Abdomen Pelvis W Contrast  05/31/2015  CLINICAL DATA:  Abdominal pain.  Type 1 diabetes and gastroparesis. EXAM: CT ABDOMEN AND PELVIS WITH CONTRAST TECHNIQUE: Multidetector CT imaging of the abdomen and pelvis was performed using the standard protocol following bolus administration of intravenous contrast. CONTRAST:  186mL  OMNIPAQUE IOHEXOL 300 MG/ML  SOLN COMPARISON:  05/14/2015 FINDINGS: Lower chest and abdominal wall:  Anasarca. Chronic lower esophageal thickening, with small sliding hiatal hernia better seen on the previous study. Hepatobiliary: Probable steatosis.Cholecystectomy. Normal common bile duct diameter. Pancreas: Unremarkable. Spleen: Unremarkable. Adrenals/Urinary Tract: Negative adrenals. Moderate bilateral hydroureteronephrosis which is greater than previously seen. Hydronephrosis is secondary to chronic marked over distension of the urinary bladder, extending to the umbilicus. The wall is chronically circumferentially thickened without acute surrounding inflammatory change. Small volume gas in the bladder which is nonspecific. Anticipate drainage and urinalysis. Reproductive:Small enhancing uterine masses, up to 21 mm, consistent with fibroids. Negative adnexa. Stomach/Bowel: No obstruction. No inflammatory change. No gastric dilatation. Vascular/Lymphatic: No acute vascular abnormality. No mass or adenopathy. Peritoneal: No ascites or pneumoperitoneum. Musculoskeletal: No acute abnormalities. IMPRESSION: 1. Chronically over distended bladder with progressive bilateral hydroureteronephrosis. 2. Chronic  findings are stable and described above. Electronically Signed   By: Monte Fantasia M.D.   On: 05/31/2015 15:37    Scheduled Meds: . enoxaparin (LOVENOX) injection  40 mg Subcutaneous Q24H  . insulin aspart  0-9 Units Subcutaneous TID WC  . insulin detemir  5 Units Subcutaneous Daily  . LORazepam  2 mg Intravenous Q6H  . ondansetron (ZOFRAN) IV  4 mg Intravenous 4 times per day  . pantoprazole (PROTONIX) IV  40 mg Intravenous Q12H  . potassium & sodium phosphates  1 packet Oral TID WC & HS  . sodium chloride  1,000 mL Intravenous Once  . sodium chloride  10-40 mL Intracatheter Q12H   Continuous Infusions: . dextrose 5 % and 0.9% NaCl 1,000 mL with potassium chloride 40 mEq infusion 100 mL/hr at 06/01/15 0407      Time spent: [place], [place] [facility] Pager [phone]. If 7PM-7AM, please contact night-coverage at [email or web address], password [facility] 06/01/2015, 1:38 PM  LOS: 6 days

## 2015-06-01 NOTE — Progress Notes (Signed)
Pt had a very large amount of bowel movement & said she feels better after.

## 2015-06-01 NOTE — Progress Notes (Signed)
Patient ID: [name], female   DOB: 21-Nov-1965, 49 y.o.   MRN: [id number]    Progress Note   Subjective  More alert, feels bad, constant nausea, no vomiting, hasn't eaten   Objective   Vital signs in last 24 hours: Temp:  [98.2 F (36.8 C)-98.7 F (37.1 C)] 98.2 F (36.8 C) (12/07 0531) Pulse Rate:  [83-109] 91 (12/07 0531) Resp:  [18-20] 18 (12/07 0531) BP: (119-174)/(55-86) 136/67 mmHg (12/07 0531) SpO2:  [99 %-100 %] 100 % (12/07 0531) Last BM Date: 05/29/15 (per pt) General:   AA female in NAD Heart:  Regular rate and rhythm; no murmurs Lungs: Respirations even and unlabored, lungs CTA bilaterally Abdomen:  Soft, mildly tender upper abdomen and nondistended. Normal bowel sounds. Extremities:  Without edema. Neurologic:  Alert and oriented,  grossly normal neurologically. Psych:  Cooperative. Normal mood and affect.  Intake/Output from previous day: 12/06 0701 - 12/07 0700 In: 1220 [I.V.:1220] Out: 2150 [Urine:2150] Intake/Output this shift:    Lab Results: No results for input(s): WBC, HGB, HCT, PLT in the last 72 hours. BMET  Recent Labs  05/30/15 0442 05/31/15 0343 06/01/15 0645  NA 143 140 142  K 2.8* 4.0 4.7  CL 108 109 111  CO2 27 25 27   GLUCOSE 46* 239* 126*  BUN <5* <5* <5*  CREATININE 0.71 0.82 0.87  CALCIUM 8.4* 8.7* 8.6*   LFT  Recent Labs  05/31/15 0343  PROT 6.3*  ALBUMIN 3.1*  AST 17  ALT 16  ALKPHOS 77  BILITOT 0.8  BILIDIR <0.1*  IBILI NOT CALCULATED   PT/INR No results for input(s): LABPROT, INR in the last 72 hours.  Studies/Results: Ct Abdomen Pelvis W Contrast  05/31/2015  CLINICAL DATA:  Abdominal pain.  Type 1 diabetes and gastroparesis. EXAM: CT ABDOMEN AND PELVIS WITH CONTRAST TECHNIQUE: Multidetector CT imaging of the abdomen and pelvis was performed using the standard protocol following bolus administration of intravenous contrast. CONTRAST:  178mL OMNIPAQUE IOHEXOL 300 MG/ML  SOLN COMPARISON:  05/14/2015  FINDINGS: Lower chest and abdominal wall:  Anasarca. Chronic lower esophageal thickening, with small sliding hiatal hernia better seen on the previous study. Hepatobiliary: Probable steatosis.Cholecystectomy. Normal common bile duct diameter. Pancreas: Unremarkable. Spleen: Unremarkable. Adrenals/Urinary Tract: Negative adrenals. Moderate bilateral hydroureteronephrosis which is greater than previously seen. Hydronephrosis is secondary to chronic marked over distension of the urinary bladder, extending to the umbilicus. The wall is chronically circumferentially thickened without acute surrounding inflammatory change. Small volume gas in the bladder which is nonspecific. Anticipate drainage and urinalysis. Reproductive:Small enhancing uterine masses, up to 21 mm, consistent with fibroids. Negative adnexa. Stomach/Bowel: No obstruction. No inflammatory change. No gastric dilatation. Vascular/Lymphatic: No acute vascular abnormality. No mass or adenopathy. Peritoneal: No ascites or pneumoperitoneum. Musculoskeletal: No acute abnormalities. IMPRESSION: 1. Chronically over distended bladder with progressive bilateral hydroureteronephrosis. 2. Chronic findings are stable and described above. Electronically Signed   By: Monte Fantasia M.D.   On: 05/31/2015 15:37      Assessment / Plan:   #1 49 yo female diabetic with recurrent DKA, and refractory gastroparesis #2 urinary retention/bilateral hydronephrosis-workup in progress  Plan: Full liquids as tolerated Stop Dilaudid this am not helping either problem Continue IV EMYCIN, RTC Zofran, and BID PPI Stop outpt marinol Consider transfer to [facility] if no improvement soon for eval with Dr Toney Rakes, and Derrill Kay   Principal Problem:   Diabetic ketoacidosis without coma associated with type 1 diabetes mellitus ([place]) Active Problems:   Gastroparesis due to DM ([place])  Nausea & vomiting   Anxiety and depression   DKA (diabetic ketoacidoses) (HCC)   Anemia    Hypomagnesemia     LOS: 6 days   Amy Esterwood  06/01/2015, 9:57 AM     Attending physician's note   I have taken an interval history, reviewed the chart and examined the patient. I agree with the Advanced Practitioner's note, impression and recommendations.   Lucio Edward, MD [facility] [phone] Mon-Fri 8a-5p [phone] after 5p, weekends, holidays

## 2015-06-02 DIAGNOSIS — E1010 Type 1 diabetes mellitus with ketoacidosis without coma: Secondary | ICD-10-CM | POA: Diagnosis not present

## 2015-06-02 DIAGNOSIS — E1143 Type 2 diabetes mellitus with diabetic autonomic (poly)neuropathy: Secondary | ICD-10-CM | POA: Diagnosis not present

## 2015-06-02 DIAGNOSIS — K3184 Gastroparesis: Secondary | ICD-10-CM | POA: Diagnosis not present

## 2015-06-02 LAB — GLUCOSE, CAPILLARY
GLUCOSE-CAPILLARY: 151 mg/dL — AB (ref 65–99)
GLUCOSE-CAPILLARY: 387 mg/dL — AB (ref 65–99)
GLUCOSE-CAPILLARY: 249 mg/dL — AB (ref 65–99)
GLUCOSE-CAPILLARY: 136 mg/dL — AB (ref 65–99)

## 2015-06-02 NOTE — Progress Notes (Signed)
Patient ID: [name], female   DOB: 1965/08/02, 49 y.o.   MRN: [id number]    Progress Note   Subjective   A little more alert, says nausea coming and going but not constant, has not tried to eat today. Large bowel movement yesterday.    Objective   Vital signs in last 24 hours: Temp:  [98.1 F (36.7 C)-98.9 F (37.2 C)] 98.7 F (37.1 C) (12/08 0545) Pulse Rate:  [81-92] 92 (12/08 0545) Resp:  [18-20] 18 (12/08 0545) BP: (93-135)/(42-64) 135/56 mmHg (12/08 0545) SpO2:  [99 %-100 %] 100 % (12/08 0545) Last BM Date: 06/01/15 General:    AA female in NAD Heart:  Regular rate and rhythm; no murmurs Lungs: Respirations even and unlabored, lungs CTA bilaterally Abdomen:  Soft, no focal tenderness, nondistended. Normal bowel sounds. Extremities:  Without edema. Neurologic:  Alert and oriented,  grossly normal neurologically. Psych:  Cooperative. Normal mood and affect.  Intake/Output from previous day: 12/07 0701 - 12/08 0700 In: 360 [P.O.:360] Out: 1975 [Urine:1975] Intake/Output this shift:    Lab Results: No results for input(s): WBC, HGB, HCT, PLT in the last 72 hours. BMET  Recent Labs  05/31/15 0343 06/01/15 0645  NA 140 142  K 4.0 4.7  CL 109 111  CO2 25 27  GLUCOSE 239* 126*  BUN <5* <5*  CREATININE 0.82 0.87  CALCIUM 8.7* 8.6*   LFT  Recent Labs  05/31/15 0343  PROT 6.3*  ALBUMIN 3.1*  AST 17  ALT 16  ALKPHOS 77  BILITOT 0.8  BILIDIR <0.1*  IBILI NOT CALCULATED   PT/INR No results for input(s): LABPROT, INR in the last 72 hours.  Studies/Results: Ct Abdomen Pelvis W Contrast  05/31/2015  CLINICAL DATA:  Abdominal pain.  Type 1 diabetes and gastroparesis. EXAM: CT ABDOMEN AND PELVIS WITH CONTRAST TECHNIQUE: Multidetector CT imaging of the abdomen and pelvis was performed using the standard protocol following bolus administration of intravenous contrast. CONTRAST:  116mL OMNIPAQUE IOHEXOL 300 MG/ML  SOLN COMPARISON:  05/14/2015 FINDINGS:  Lower chest and abdominal wall:  Anasarca. Chronic lower esophageal thickening, with small sliding hiatal hernia better seen on the previous study. Hepatobiliary: Probable steatosis.Cholecystectomy. Normal common bile duct diameter. Pancreas: Unremarkable. Spleen: Unremarkable. Adrenals/Urinary Tract: Negative adrenals. Moderate bilateral hydroureteronephrosis which is greater than previously seen. Hydronephrosis is secondary to chronic marked over distension of the urinary bladder, extending to the umbilicus. The wall is chronically circumferentially thickened without acute surrounding inflammatory change. Small volume gas in the bladder which is nonspecific. Anticipate drainage and urinalysis. Reproductive:Small enhancing uterine masses, up to 21 mm, consistent with fibroids. Negative adnexa. Stomach/Bowel: No obstruction. No inflammatory change. No gastric dilatation. Vascular/Lymphatic: No acute vascular abnormality. No mass or adenopathy. Peritoneal: No ascites or pneumoperitoneum. Musculoskeletal: No acute abnormalities. IMPRESSION: 1. Chronically over distended bladder with progressive bilateral hydroureteronephrosis. 2. Chronic findings are stable and described above. Electronically Signed   By: Monte Fantasia M.D.   On: 05/31/2015 15:37       Assessment / Plan:    #1 49 yo female with recurrent DKA and refractory gastroparesis- Making some progress- off Dilaudid, and less nauseated-needs to try to eat Continue current regimen of meds IV for now, convert to po when keeping down pos well She needs appt at [facility] soon after discharge Dr Koch/Dr Toney Rakes Unfortunately severe gastroparesis can be slow to improve-we will sign off, available for questions  #2 Severe bilateral  hydronephrosis and bladder distention on CT - likely contributing. Catheter in place  Recommend Urology consult  Principal Problem:   Diabetic ketoacidosis without coma associated with type 1 diabetes mellitus ([place]) Active  Problems:   Gastroparesis due to DM ([place])   Nausea & vomiting   Anxiety and depression   DKA (diabetic ketoacidoses) (H[facility])   Anemia   Hypomagnesemia     LOS: 7 days   Amy Esterwood  06/02/2015, 9:48 AM     Attending physician's note   I have taken an interval history, reviewed the chart and examined the patient. I agree with the Advanced Practitioner's note, impression and recommendations. Gastroparesis is slowly improving on current regimen. Would anticipate continued slow improvement. Continue current GI medications and covert to PO when appropriate. No additional GI recommendations. Recommend Urology consult for hydronephrosis and bladder distention. GI signing off. Available if needed. GI follow up at [facility] with Dr. Kenard Gower. Toney Rakes.    Lucio Edward, MD [facility] [phone] Mon-Fri 8a-5p [phone] after 5p, weekends, holidays

## 2015-06-02 NOTE — Progress Notes (Signed)
Inpatient Diabetes Program Recommendations  AACE/ADA: New Consensus Statement on Inpatient Glycemic Control (2015)  Target Ranges:  Prepandial:   less than 140 mg/dL      Peak postprandial:   less than 180 mg/dL (1-2 hours)      Critically ill patients:  140 - 180 mg/dL    Results for ANGELEIGH, WURTH (MRN [id number]) as of 06/02/2015 09:26  Ref. Range 05/31/2015 07:53 05/31/2015 11:59 05/31/2015 17:14 05/31/2015 20:54 05/31/2015 22:09  Glucose-Capillary Latest Ref Range: 65-99 mg/dL 356 (H) 213 (H) 92 64 (L) 97    Results for JOVITA, IBARRA (MRN [id number]) as of 06/02/2015 09:26  Ref. Range 06/01/2015 07:31 06/01/2015 12:03 06/01/2015 16:49 06/01/2015 20:47  Glucose-Capillary Latest Ref Range: 65-99 mg/dL 140 (H) 307 (H) 186 (H) 72    Results for CALYNN, TINNELL (MRN [id number]) as of 06/02/2015 09:26  Ref. Range 06/02/2015 07:29  Glucose-Capillary Latest Ref Range: 65-99 mg/dL 387 (H)   Home DM Meds: Levemir 15 units QHS       Novolog 2-15 units tid per SSI  Current Insulin Orders: Levemir 5 units daily      Novolog Sensitive SSI (0-9 units) TID AC     -Patient with very poor PO intake at present.  -Has been having occasional episodes of Hypoglycemia followed by CBGs in the 300s at times.  -Note Levemir reduced back to 5 units daily.    MD- Please consider the following in-hospital insulin adjustments:  1. Increase Levemir to 8 units daily  2. Change Novolog Sensitive SSI (0-9 units) to Q4 hour coverage (currently ordered as TID AC)    --Will follow patient during hospitalization--  Wyn Quaker RN, MSN, CDE Diabetes Coordinator Inpatient Glycemic Control Team Team Pager: [phone] (8a-5p)

## 2015-06-02 NOTE — Progress Notes (Addendum)
[facility] PROGRESS NOTE  [name] [id number] DOB: 1965-09-28 DOA: 05/26/2015 PCP: Barbette Merino, MD  49 year old female with uncontrolled type 1 diabetes mellitus with severe gastroparesis (being followed by GI Dr Derrill Kay at [facility] with plan on gastric stimulator placement by Dr. Toney Rakes with recurrent hospitalization for the past few months with DKA and severe gastroparesis symptoms. (This is her ninth hospitalization since April for similar issue and 3 times already in fast 10 days). Patient discharged 3 days back and return to the ED with multiple episodes of nausea and vomiting associated with abdominal pain since the day prior to admission. Patient reports taking her insulin as instructed and being compliant with recommended diet. Patient found to be severely dehydrated and in DKA. Admitted to stepdown and started on aggressive IV hydration and insulin drip.  Assessment/Plan: Severe gastroparesis following with GI at [facility] given recurrent  uncontrollable symptoms.  Patient has had multiple visits to ED with gastroparetic symptoms and multiple hospitalizations as well. Intolerant to Reglan (causes a rash and numbness of her tongue). Placed on scheduled Phenergan. When necessary Zofran. continue hydration.  -As per GI note at [facility] from September she is being evaluated by Dr. Toney Rakes for placement of a gastric stimulator. A reports that her insurance has recently been activated. As per GI note the procedure is scheduled in ?January (per prior PN) -Continue Protonix.  -Nausea continues but no vomiting. Received  3 days of IV erythromycin . Patient is very anxious to eat anything. -try full liquid today. dced pain meds and marinol. -GI following. recommend to continue current management   DKA with uncontrolled type 1 diabetes mellitus and severe recurrent gastroparesis Admitted to stepdown started on IV insulin with aggressive IV hydration. Anion gap closed.  Switched to sliding scale insulin and Lantus. A1c of 7.4.  Hypoglycemia Secondary to poor by mouth intake. Will decrease MIVF On low dose lantus.  Anxiety and depression On Xanax at home. Patient extremely anxious and scared to eat anything. Placed on  scheduled ativan.Marland Kitchen Resume her antidepression once tolerating by mouth.  Anemia Stable.  Hypokalemia and hypomagnesemia Replenished. Has low phosphorous as well . Added  potassium phosphate powder.  Overdistended bladder with BL hydroureteronephrosis - Will consult Urology   Essential hypertension with elevated blood pressure Ordered when necessary hydralazine.  Protein calorie malnutrition, severe   DVT prophylaxis: Subcutaneous Lovenox Diet: full liquid as tolerated   Code Status: Full code Family Communication: None at bedside Disposition Plan:  Addendum: Awaiting improvement in oral intake. Barrier to d/c poor oral intake   Consultants:  [facility]  Procedures:  none  Antibiotics:  None  HPI/Subjective: Patient has no new complaints.   Objective: Filed Vitals:   06/02/15 1018 06/02/15 1307  BP: 139/63 145/70  Pulse: 97 99  Temp: 98.7 F (37.1 C) 98.8 F (37.1 C)  Resp: 16 16    Intake/Output Summary (Last 24 hours) at 06/02/15 1537 Last data filed at 06/02/15 1343  Gross per 24 hour  Intake    480 ml  Output   1975 ml  Net  -1495 ml   Filed Weights   05/28/15 0800  Weight: 56.8 kg (125 lb 3.5 oz)    Exam:   General: Appears fatigued,poorly communicates  HEENT: dry oral mucosa, supple neck  Chest: Clear to auscultation bilaterally  CVS: S1 and S2 tachycardic, no murmurs  GI: Soft, nondistended, no epigastric tenderness, bowel sounds present  Musculoskeletal: Warm, no edema  Data Reviewed: Basic Metabolic Panel:  Recent  Labs Lab 05/26/15 1952  05/27/15 0355 05/27/15 0650 05/28/15 0345 05/30/15 0442 05/31/15 0343 06/01/15 0645  NA  --   < > 137 139 138 143 140 142  K  --    < > 3.6 3.7 3.6 2.8* 4.0 4.7  CL  --   < > 107 106 108 108 109 111  CO2  --   < > 25 23 19* 27 25 27   GLUCOSE  --   < > 156* 174* 233* 46* 239* 126*  BUN  --   < > 10 8 8  <5* <5* <5*  CREATININE  --   < > 0.76 0.73 0.92 0.71 0.82 0.87  CALCIUM  --   < > 8.4* 8.8* 8.5* 8.4* 8.7* 8.6*  MG 1.6*  --   --   --   --  1.3*  --  1.6*  PHOS 4.1  --  1.7* 2.1*  --   --   --  3.2  < > = values in this interval not displayed. Liver Function Tests:  Recent Labs Lab 05/26/15 1825 05/31/15 0343  AST 25 17  ALT 25 16  ALKPHOS 95 77  BILITOT 1.5* 0.8  PROT 7.6 6.3*  ALBUMIN 4.1 3.1*    Recent Labs Lab 05/31/15 0343  LIPASE 23   No results for input(s): AMMONIA in the last 168 hours. CBC:  Recent Labs Lab 05/26/15 1825 05/27/15 0650 05/28/15 0345  WBC 10.1 13.3* 10.4  NEUTROABS  --  10.9*  --   HGB 10.5* 9.8* 9.7*  HCT 32.3* 29.8* 30.6*  MCV 84.8 82.5 86.7  PLT 382 373 393   Cardiac Enzymes: No results for input(s): CKTOTAL, CKMB, CKMBINDEX, TROPONINI in the last 168 hours. BNP (last 3 results) No results for input(s): BNP in the last 8760 hours.  ProBNP (last 3 results) No results for input(s): PROBNP in the last 8760 hours.  CBG:  Recent Labs Lab 06/01/15 1649 06/01/15 2047 06/02/15 0100 06/02/15 0729 06/02/15 1142  GLUCAP 186* 72 151* 387* 249*    Recent Results (from the past 240 hour(s))  MRSA PCR Screening     Status: None   Collection Time: 05/26/15  9:55 PM  Result Value Ref Range Status   MRSA by PCR NEGATIVE NEGATIVE Final    Comment:        The GeneXpert MRSA Assay (FDA approved for NASAL specimens only), is one component of a comprehensive MRSA colonization surveillance program. It is not intended to diagnose MRSA infection nor to guide or monitor treatment for MRSA infections.      Studies: No results found.  Scheduled Meds: . enoxaparin (LOVENOX) injection  40 mg Subcutaneous Q24H  . insulin aspart  0-9 Units Subcutaneous TID WC  .  insulin detemir  5 Units Subcutaneous Daily  . LORazepam  2 mg Intravenous Q6H  . ondansetron (ZOFRAN) IV  4 mg Intravenous 4 times per day  . pantoprazole (PROTONIX) IV  40 mg Intravenous Q12H  . potassium & sodium phosphates  1 packet Oral TID WC & HS  . sodium chloride  1,000 mL Intravenous Once  . sodium chloride  10-40 mL Intracatheter Q12H   Continuous Infusions: . dextrose 5 % and 0.9% NaCl 1,000 mL with potassium chloride 40 mEq infusion 100 mL/hr at 06/02/15 1050      Time spent: [place], [place] [facility] Pager [phone]. If 7PM-7AM, please contact night-coverage at [email or web address], password [facility] 06/02/2015, 3:37 PM  LOS: 7 days

## 2015-06-02 NOTE — Consult Note (Signed)
5:25 PM   [name] 1965/07/23 [id number]  Referring provider: Dr. Hillary Bow  CC: Urinary retention  HPI: The patient is a 49 year old female with a past medical history of uncontrolled diabetes type 1, gastroparesis, and neuropathy who presented the hospital with nausea, vomiting, abdominal pain and was diagnosed with DKA. On imaging during her stay, she was found to have urinary retention with the bladder to the level of the umbilicus on CT scan with bilateral hydroureteronephrosis. A Foley catheter was placed. Her creatinine is 0.83 at this time. She has any other urinary issues. She said that she voided well prior to the catheter being placed. She denied healing of incomplete emptying, straining, or weak stream. She also denied frequency.   PMH: Past Medical History  Diagnosis Date  . Diabetes mellitus without complication ([place])   . Gastroparesis     Surgical History: Past Surgical History  Procedure Laterality Date  . Retinal detachment surgery    . Posterior vitrectomy and membrane peel-left eye  09/28/2002  . Posterior vitrectomy and membrane peel-right eye  03/16/2002  . Eye surgery    . Gailstones    . Esophagogastroduodenoscopy  09/27/2014    at [facility], Dr Rolan Lipa. biospy neg for celiac, neg for H pylori.   . Colonoscopy  09/27/2014    at [facility] Medications:    Medication List    ASK your doctor about these medications        ALPRAZolam 0.25 MG tablet  Commonly known as:  XANAX  Take 1 tablet (0.25 mg total) by mouth 3 (three) times daily as needed for anxiety.     dronabinol 10 MG capsule  Commonly known as:  MARINOL  Take 10 mg by mouth 2 (two) times daily.     ferrous sulfate 325 (65 FE) MG tablet  Take 325 mg by mouth 3 (three) times a week.     FLUoxetine 40 MG capsule  Commonly known as:  PROZAC  Take 40 mg by mouth daily.     furosemide 40 MG tablet  Commonly known as:  LASIX  Take 40 mg by mouth daily.     insulin aspart 100  UNIT/ML injection  Commonly known as:  novoLOG  Inject 0-15 Units into the skin 3 (three) times daily with meals. 6-15 units three times daily with food.  Per sliding scale     insulin detemir 100 UNIT/ML injection  Commonly known as:  LEVEMIR  Inject 0.15 mLs (15 Units total) into the skin at bedtime.     lisinopril 10 MG tablet  Commonly known as:  PRINIVIL,ZESTRIL  Take 10 mg by mouth daily.     ondansetron 4 MG disintegrating tablet  Commonly known as:  ZOFRAN ODT  Take 1 tablet (4 mg total) by mouth every 8 (eight) hours as needed for nausea.     pantoprazole 40 MG tablet  Commonly known as:  PROTONIX  Take 40 mg by mouth daily.     potassium chloride 10 MEQ tablet  Commonly known as:  K-DUR  Take 1 tablet (10 mEq total) by mouth daily.     pregabalin 150 MG capsule  Commonly known as:  LYRICA  Take 1 capsule (150 mg total) by mouth 2 (two) times daily.     promethazine 25 MG tablet  Commonly known as:  PHENERGAN  Take 1 tablet (25 mg total) by mouth every 6 (six) hours as needed for nausea or vomiting.     promethazine  25 MG suppository  Commonly known as:  PHENERGAN  Place 1 suppository (25 mg total) rectally every 6 (six) hours as needed for nausea or vomiting.        Allergies:  Allergies  Allergen Reactions  . Anesthetics, Amide Nausea And Vomiting  . Penicillins Diarrhea and Nausea And Vomiting    Has patient had a PCN reaction causing immediate rash, facial/tongue/throat swelling, SOB or lightheadedness with hypotension: No Has patient had a PCN reaction causing severe rash involving mucus membranes or skin necrosis: No Has patient had a PCN reaction that required hospitalization No Has patient had a PCN reaction occurring within the last 10 years: Yes  If all of the above answers are "NO", then may proceed with Cephalosporin use.   . Buprenorphine Hcl Rash  . Encainide Nausea And Vomiting  . Metoclopramide Rash    Family History: Family History   Problem Relation Age of Onset  . Cystic fibrosis Mother   . Hypertension Father   . Diabetes Brother   . Hypertension Maternal Grandmother     Social History:  reports that she has never smoked. She has never used smokeless tobacco. She reports that she does not drink alcohol or use illicit drugs.  ROS: 12 point ROS otherwise negative except HPI                                        Physical Exam: BP 145/70 mmHg  Pulse 99  Temp(Src) 98.8 F (37.1 C) (Oral)  Resp 16  Ht 5\' 5"  (1.651 m)  Wt 125 lb 3.5 oz (56.8 kg)  BMI 20.84 kg/m2  SpO2 100%  LMP 05/10/2015  Constitutional:  Alert and oriented, No acute distress. HEENT: [place] AT, moist mucus membranes.  Trachea midline, no masses. Cardiovascular: No clubbing, cyanosis, or edema. Respiratory: Normal respiratory effort, no increased work of breathing. GI: Abdomen is soft, nontender, nondistended, no abdominal masses GU: No CVA tenderness. Foley catheter in place draining clear yellow urine. Skin: No rashes, bruises or suspicious lesions. Lymph: No cervical or inguinal adenopathy. Neurologic: Grossly intact, no focal deficits, moving all 4 extremities. Psychiatric: Normal mood and affect.  Laboratory Data: Lab Results  Component Value Date   WBC 10.4 05/28/2015   HGB 9.7* 05/28/2015   HCT 30.6* 05/28/2015   MCV 86.7 05/28/2015   PLT 393 05/28/2015    Lab Results  Component Value Date   CREATININE 0.87 06/01/2015    No results found for: PSA  No results found for: TESTOSTERONE  Lab Results  Component Value Date   HGBA1C 7.3* 05/18/2015    Urinalysis    Component Value Date/Time   COLORURINE YELLOW 05/26/2015 1916   APPEARANCEUR CLEAR 05/26/2015 1916   LABSPEC 1.019 05/26/2015 1916   PHURINE 6.5 05/26/2015 1916   GLUCOSEU >1000* 05/26/2015 1916   HGBUR TRACE* 05/26/2015 1916   BILIRUBINUR NEGATIVE 05/26/2015 1916   BILIRUBINUR NEGATIVE 08/04/2012 1005   KETONESUR >80* 05/26/2015  1916   PROTEINUR 30* 05/26/2015 1916   PROTEINUR >=300 08/04/2012 1005   UROBILINOGEN 0.2 04/27/2015 1608   UROBILINOGEN 0.2 08/04/2012 1005   NITRITE NEGATIVE 05/26/2015 1916   NITRITE POSITIVE 08/04/2012 1005   LEUKOCYTESUR NEGATIVE 05/26/2015 1916    Pertinent Imaging: IMPRESSION: 1. Chronically over distended bladder with progressive bilateral hydroureteronephrosis. 2. Chronic findings are stable and described above.  Assessment & Plan:    The patient likely has  a neurogenic bladder secondary to uncontrolled diabetes. Her bilateral hydronephrosis is likely secondary to her urinary retention. Since her creatinine is normal, I recommend the patient be discharged home with a Foley catheter in place. She should follow-up in 1-2 weeks to ensure that she has resolution of her hydronephrosis with proper drainage. Fortunately, this time it does not appear she is on any significant damage to kidneys from her chronic retention. She will likely need urodynamic studies and more than likely clean intermittent catheterization for the long-term.  1. Urinary retention -ok for d/c home from urology standpoint with foley -f/u in 1-2 weeks  2.  Bilateral hydroureteronephrosis -secondary to above -will ensure resolution with renal u/s at follow up   Nickie Retort, MD

## 2015-06-03 DIAGNOSIS — E1010 Type 1 diabetes mellitus with ketoacidosis without coma: Secondary | ICD-10-CM | POA: Diagnosis not present

## 2015-06-03 LAB — GLUCOSE, CAPILLARY
GLUCOSE-CAPILLARY: 150 mg/dL — AB (ref 65–99)
GLUCOSE-CAPILLARY: 247 mg/dL — AB (ref 65–99)
GLUCOSE-CAPILLARY: 162 mg/dL — AB (ref 65–99)
GLUCOSE-CAPILLARY: 304 mg/dL — AB (ref 65–99)
Glucose-Capillary: 401 mg/dL — ABNORMAL HIGH (ref 65–99)

## 2015-06-03 MED ORDER — INSULIN DETEMIR 100 UNIT/ML ~~LOC~~ SOLN
8.0000 [IU] | Freq: Every day | SUBCUTANEOUS | Status: DC
  Administered 2015-06-04 – 2015-06-05 (×2): 8 [IU] via SUBCUTANEOUS
  Filled 2015-06-03 (×2): qty 0.08

## 2015-06-03 NOTE — Progress Notes (Signed)
Inpatient Diabetes Program Recommendations  AACE/ADA: New Consensus Statement on Inpatient Glycemic Control (2015)  Target Ranges:  Prepandial:   less than 140 mg/dL      Peak postprandial:   less than 180 mg/dL (1-2 hours)      Critically ill patients:  140 - 180 mg/dL    Results for [name], [name] (MRN [id number]) as of 06/03/2015 08:24  Ref. Range 06/02/2015 07:29 06/02/2015 11:42 06/02/2015 16:37 06/02/2015 21:46  Glucose-Capillary Latest Ref Range: 65-99 mg/dL 387 (H) 249 (H) 136 (H) 150 (H)   Results for [name], [name] (MRN [id number]) as of 06/03/2015 08:24  Ref. Range 06/03/2015 07:40  Glucose-Capillary Latest Ref Range: 65-99 mg/dL 401 (H)    Home DM Meds: Levemir 15 units QHS  Novolog 2-15 units tid per SSI  Current Insulin Orders: Levemir 5 units daily  Novolog Sensitive SSI (0-9 units) TID AC     -Patient with intermittent poor PO intake.  -Has been having occasional episodes of Hypoglycemia followed by CBGs in the 300s at times.  -Note Levemir reduced back to 5 units daily.  -AM CBG significantly elevated the last two days.    MD- Please consider the following in-hospital insulin adjustments:  1. Increase Levemir to 8 units daily  2. Change Novolog Sensitive SSI (0-9 units) to Q4 hour coverage (currently ordered as TID AC)    --Will follow patient during hospitalization--  Wyn Quaker RN, MSN, CDE Diabetes Coordinator Inpatient Glycemic Control Team Team Pager: [phone] (8a-5p)

## 2015-06-03 NOTE — Progress Notes (Signed)
[facility] PROGRESS NOTE  Rickey Postle [id number] DOB: September 01, 1965 DOA: 05/26/2015 PCP: Barbette Merino, MD  49 year old female with uncontrolled type 1 diabetes mellitus with severe gastroparesis (being followed by GI Dr Derrill Kay at [facility] with plan on gastric stimulator placement by Dr. Toney Rakes with recurrent hospitalization for the past few months with DKA and severe gastroparesis symptoms. (This is her ninth hospitalization since April for similar issue and 3 times already in fast 10 days). Patient discharged 3 days back and return to the ED with multiple episodes of nausea and vomiting associated with abdominal pain since the day prior to admission. Patient reports taking her insulin as instructed and being compliant with recommended diet. Patient found to be severely dehydrated and in DKA. Admitted to stepdown and started on aggressive IV hydration and insulin drip.  Assessment/Plan: Severe gastroparesis following with GI at [facility] given recurrent  uncontrollable symptoms.  Patient has had multiple visits to ED with gastroparetic symptoms and multiple hospitalizations as well. Intolerant to Reglan (causes a rash and numbness of her tongue). Placed on scheduled Phenergan. When necessary Zofran. continue hydration.  -As per GI note at [facility] from September she is being evaluated by Dr. Toney Rakes for placement of a gastric stimulator. A reports that her insurance has recently been activated. As per GI note the procedure is scheduled in ?January (per prior PN) -Continue Protonix.  -Nausea continues but no vomiting. Received  3 days of IV erythromycin . Patient is very anxious to eat anything. -try full liquid today. dced pain meds and marinol. -GI was following. recommended continuing current management   DKA with uncontrolled type 1 diabetes mellitus and severe recurrent gastroparesis Admitted to stepdown started on IV insulin with aggressive IV hydration. Anion gap closed.  Switched to sliding scale insulin and Lantus. A1c of 7.4.  Hypoglycemia Secondary to poor by mouth intake. Will decrease MIVF On low dose lantus.  Anxiety and depression On Xanax at home. Patient extremely anxious and scared to eat anything. Placed on  scheduled ativan.Marland Kitchen Resume her antidepression once tolerating by mouth.  Anemia Stable.  Hypokalemia and hypomagnesemia Replenished. Has low phosphorous as well . Added  potassium phosphate powder.  Overdistended bladder with BL hydroureteronephrosis - Will consult Urology   Essential hypertension with elevated blood pressure Ordered when necessary hydralazine.  Protein calorie malnutrition, severe   DVT prophylaxis: Subcutaneous Lovenox Diet: full liquid as tolerated   Code Status: Full code Family Communication: None at bedside Disposition Plan:  Addendum: Awaiting improvement in oral intake. Barrier to d/c poor oral intake   Consultants:  [facility]  Procedures:  none  Antibiotics:  None  HPI/Subjective: Patient has no new complaints.   Objective: Filed Vitals:   06/03/15 1028 06/03/15 1525  BP: 118/53 100/71  Pulse: 101 99  Temp: 98.8 F (37.1 C) 98.5 F (36.9 C)  Resp: 18 20    Intake/Output Summary (Last 24 hours) at 06/03/15 1538 Last data filed at 06/03/15 1506  Gross per 24 hour  Intake   1720 ml  Output   3825 ml  Net  -2105 ml   Filed Weights   05/28/15 0800  Weight: 56.8 kg (125 lb 3.5 oz)    Exam:   General: Alert and awake, in NAD  HEENT: dry oral mucosa, supple neck  Chest: Clear to auscultation bilaterally  CVS: S1 and S2 tachycardic, no murmurs  GI: Soft, nondistended, no epigastric tenderness, bowel sounds present  Musculoskeletal: Warm, no edema  Data Reviewed: Basic Metabolic Panel:  Recent Labs Lab 05/28/15 0345 05/30/15 0442 05/31/15 0343 06/01/15 0645  NA 138 143 140 142  K 3.6 2.8* 4.0 4.7  CL 108 108 109 111  CO2 19* 27 25 27   GLUCOSE 233* 46*  239* 126*  BUN 8 <5* <5* <5*  CREATININE 0.92 0.71 0.82 0.87  CALCIUM 8.5* 8.4* 8.7* 8.6*  MG  --  1.3*  --  1.6*  PHOS  --   --   --  3.2   Liver Function Tests:  Recent Labs Lab 05/31/15 0343  AST 17  ALT 16  ALKPHOS 77  BILITOT 0.8  PROT 6.3*  ALBUMIN 3.1*    Recent Labs Lab 05/31/15 0343  LIPASE 23   No results for input(s): AMMONIA in the last 168 hours. CBC:  Recent Labs Lab 05/28/15 0345  WBC 10.4  HGB 9.7*  HCT 30.6*  MCV 86.7  PLT 393   Cardiac Enzymes: No results for input(s): CKTOTAL, CKMB, CKMBINDEX, TROPONINI in the last 168 hours. BNP (last 3 results) No results for input(s): BNP in the last 8760 hours.  ProBNP (last 3 results) No results for input(s): PROBNP in the last 8760 hours.  CBG:  Recent Labs Lab 06/02/15 1142 06/02/15 1637 06/02/15 2146 06/03/15 0740 06/03/15 1204  GLUCAP 249* 136* 150* 401* 247*    Recent Results (from the past 240 hour(s))  MRSA PCR Screening     Status: None   Collection Time: 05/26/15  9:55 PM  Result Value Ref Range Status   MRSA by PCR NEGATIVE NEGATIVE Final    Comment:        The GeneXpert MRSA Assay (FDA approved for NASAL specimens only), is one component of a comprehensive MRSA colonization surveillance program. It is not intended to diagnose MRSA infection nor to guide or monitor treatment for MRSA infections.      Studies: No results found.  Scheduled Meds: . enoxaparin (LOVENOX) injection  40 mg Subcutaneous Q24H  . insulin aspart  0-9 Units Subcutaneous TID WC  . [START ON 06/04/2015] insulin detemir  8 Units Subcutaneous Daily  . LORazepam  2 mg Intravenous Q6H  . ondansetron (ZOFRAN) IV  4 mg Intravenous 4 times per day  . pantoprazole (PROTONIX) IV  40 mg Intravenous Q12H  . potassium & sodium phosphates  1 packet Oral TID WC & HS  . sodium chloride  1,000 mL Intravenous Once  . sodium chloride  10-40 mL Intracatheter Q12H   Continuous Infusions: . dextrose 5 % and 0.9%  NaCl 1,000 mL with potassium chloride 40 mEq infusion 50 mL/hr at 06/02/15 2215      Time spent: [place], [place] [facility] Pager [phone]. If 7PM-7AM, please contact night-coverage at [email or web address], password [facility] 06/03/2015, 3:38 PM  LOS: 8 days

## 2015-06-04 DIAGNOSIS — E1010 Type 1 diabetes mellitus with ketoacidosis without coma: Secondary | ICD-10-CM | POA: Diagnosis not present

## 2015-06-04 LAB — GLUCOSE, CAPILLARY
GLUCOSE-CAPILLARY: 518 mg/dL — AB (ref 65–99)
GLUCOSE-CAPILLARY: 185 mg/dL — AB (ref 65–99)
Glucose-Capillary: 365 mg/dL — ABNORMAL HIGH (ref 65–99)
Glucose-Capillary: 212 mg/dL — ABNORMAL HIGH (ref 65–99)
Glucose-Capillary: 150 mg/dL — ABNORMAL HIGH (ref 65–99)

## 2015-06-04 MED ORDER — INSULIN ASPART 100 UNIT/ML ~~LOC~~ SOLN
15.0000 [IU] | Freq: Once | SUBCUTANEOUS | Status: AC
  Administered 2015-06-04: 15 [IU] via SUBCUTANEOUS

## 2015-06-04 MED ORDER — INSULIN DETEMIR 100 UNIT/ML ~~LOC~~ SOLN: 10.0000 [IU] | Freq: Every day | SUBCUTANEOUS | Status: DC

## 2015-06-04 NOTE — Discharge Summary (Addendum)
Physician Discharge Summary  [name] [id number] DOB: October 21, 1965 DOA: 05/26/2015  PCP: Barbette Merino, MD  Admit date: 05/26/2015 Discharge date: 06/04/2015  Time spent: > 35 minutes  Recommendations for Outpatient Follow-up:  1. Monitor blood sugars 2. Patient was on lasix but due to recent hospitalization for poor oral intake will hold on d/c. Please decide when to continue if warranted. 3. Pt will need to f/u with Urology for urodynamic testing within the next 1-2 weeks 4. Pt has recurrent abdominal discomfort presumably from diabetic gastroparesis. Would recommend avoiding pain medication which may decrease gastric motility   Discharge Diagnoses:  Principal Problem:   Diabetic ketoacidosis without coma associated with type 1 diabetes mellitus ([place]) Active Problems:   Gastroparesis due to DM ([facility])   Nausea & vomiting   Anxiety and depression   DKA (diabetic ketoacidoses) ([place])   Anemia   Hypomagnesemia   Hydronephrosis   Discharge Condition: stable  Diet recommendation: Carb modified diet  Filed Weights   05/28/15 0800  Weight: 56.8 kg (125 lb 3.5 oz)    History of present illness:  From original HPI: 49 year old female with uncontrolled type 1 diabetes mellitus with severe gastroparesis (being followed by GI at [facility] with plan on gastric stimulator placement by Dr. Toney Rakes with recurrent hospitalization for the past few months with DKA and severe gastroparesis symptoms. (This is her ninth hospitalization since April for similar issue and 3 times already in fast 10 days). Patient discharged 3 days back and return to the ED with multiple episodes of nausea and vomiting associated with abdominal pain since the day prior to admission. Patient reports taking her insulin as instructed and being compliant with recommended diet. Patient found to be severely dehydrated and in DKA. Admitted to stepdown and started on aggressive IV hydration and insulin  drip.  Hospital Course:  Diabetic gastroparesis - resolving. Will d/c patient as she is tolerating oral regimen per my discussion with her. - Recommend she follow up with her GI specialist  Diabetes - Will d/c on Lantus 10 units SQ daily and SSI with novolog - diabetic diet  Anxiety and depression - will continue home regimen on d/c  Urinary retention - Urologist consulted in house and recommended discharging with foley in place and urology f/u for urodynamic testing.  Procedures:  None  Consultations:  Urology  Discharge Exam: Filed Vitals:   06/04/15 0535 06/04/15 1330  BP: 157/60 105/63  Pulse: 106 96  Temp: 99 F (37.2 C) 98.2 F (36.8 C)  Resp: 16 22    General: Pt in nad, alert and awake Cardiovascular: rrr, no mrg Respiratory: CTA BL, no wheezes  Discharge Instructions   Discharge Instructions    Call MD for:  persistant nausea and vomiting    Complete by:  As directed      Call MD for:  severe uncontrolled pain    Complete by:  As directed      Call MD for:  temperature >100.4    Complete by:  As directed      Diet - low sodium heart healthy    Complete by:  As directed      Discharge instructions    Complete by:  As directed   Please take either the phenergan by itself or the zofran but not both. Also if you take the suppository dont also take the oral medication. Follow up with the Urologist in the next 1-2 weeks. Call their office on Monday to confirm your appointment.  Increase activity slowly    Complete by:  As directed           Current Discharge Medication List    CONTINUE these medications which have CHANGED   Details  insulin detemir (LEVEMIR) 100 UNIT/ML injection Inject 0.1 mLs (10 Units total) into the skin at bedtime. Qty: 10 mL, Refills: 3      CONTINUE these medications which have NOT CHANGED   Details  ALPRAZolam (XANAX) 0.25 MG tablet Take 1 tablet (0.25 mg total) by mouth 3 (three) times daily as needed for  anxiety. Qty: 15 tablet, Refills: 0    dronabinol (MARINOL) 10 MG capsule Take 10 mg by mouth 2 (two) times daily.    FLUoxetine (PROZAC) 40 MG capsule Take 40 mg by mouth daily.    insulin aspart (NOVOLOG) 100 UNIT/ML injection Inject 0-15 Units into the skin 3 (three) times daily with meals. 6-15 units three times daily with food.  Per sliding scale Qty: 1 vial, Refills: 12    ondansetron (ZOFRAN ODT) 4 MG disintegrating tablet Take 1 tablet (4 mg total) by mouth every 8 (eight) hours as needed for nausea. Qty: 20 tablet, Refills: 0    pantoprazole (PROTONIX) 40 MG tablet Take 40 mg by mouth daily.     pregabalin (LYRICA) 150 MG capsule Take 1 capsule (150 mg total) by mouth 2 (two) times daily. Qty: 60 capsule, Refills: 0    promethazine (PHENERGAN) 25 MG suppository Place 1 suppository (25 mg total) rectally every 6 (six) hours as needed for nausea or vomiting. Qty: 12 each, Refills: 0    promethazine (PHENERGAN) 25 MG tablet Take 1 tablet (25 mg total) by mouth every 6 (six) hours as needed for nausea or vomiting. Qty: 20 tablet, Refills: 0      STOP taking these medications     ferrous sulfate 325 (65 FE) MG tablet      furosemide (LASIX) 40 MG tablet      lisinopril (PRINIVIL,ZESTRIL) 10 MG tablet      potassium chloride (K-DUR) 10 MEQ tablet        Allergies  Allergen Reactions  . Anesthetics, Amide Nausea And Vomiting  . Penicillins Diarrhea and Nausea And Vomiting    Has patient had a PCN reaction causing immediate rash, facial/tongue/throat swelling, SOB or lightheadedness with hypotension: No Has patient had a PCN reaction causing severe rash involving mucus membranes or skin necrosis: No Has patient had a PCN reaction that required hospitalization No Has patient had a PCN reaction occurring within the last 10 years: Yes  If all of the above answers are "NO", then may proceed with Cephalosporin use.   . Buprenorphine Hcl Rash  . Encainide Nausea And  Vomiting  . Metoclopramide Rash   Follow-up Information    Follow up with Nickie Retort, MD In 1 week.   Specialty:  Urology   Why:  Urology - Catheter management   Contact information:   [place] [address] [phone]        The results of significant diagnostics from this hospitalization (including imaging, microbiology, ancillary and laboratory) are listed below for reference.    Significant Diagnostic Studies: Dg Chest 2 View  05/17/2015  CLINICAL DATA:  Nausea, vomiting since last night.  DKA. EXAM: CHEST  2 VIEW COMPARISON:  05/14/2015 FINDINGS: The heart size and mediastinal contours are within normal limits. Both lungs are clear. The visualized skeletal structures are unremarkable. IMPRESSION: No active cardiopulmonary disease. Electronically Signed  By: Rolm Baptise M.D.   On: 05/17/2015 11:50   Dg Chest 2 View  05/14/2015  CLINICAL DATA:  N/V today; no chest complaints today, pleural effusion seen on previous Abdomen CT that was done this morning per Pa's note; hx asthma; non smoker; HTn; diabetic EXAM: CHEST  2 VIEW COMPARISON:  05/08/2015 FINDINGS: The heart size and mediastinal contours are within normal limits. Both lungs are clear. No pleural effusion or pneumothorax. The visualized skeletal structures are unremarkable. IMPRESSION: No active cardiopulmonary disease. Electronically Signed   By: Lajean Manes M.D.   On: 05/14/2015 09:49   Ct Abdomen Pelvis W Contrast  05/31/2015  CLINICAL DATA:  Abdominal pain.  Type 1 diabetes and gastroparesis. EXAM: CT ABDOMEN AND PELVIS WITH CONTRAST TECHNIQUE: Multidetector CT imaging of the abdomen and pelvis was performed using the standard protocol following bolus administration of intravenous contrast. CONTRAST:  182mL OMNIPAQUE IOHEXOL 300 MG/ML  SOLN COMPARISON:  05/14/2015 FINDINGS: Lower chest and abdominal wall:  Anasarca. Chronic lower esophageal thickening, with small sliding hiatal hernia better seen on  the previous study. Hepatobiliary: Probable steatosis.Cholecystectomy. Normal common bile duct diameter. Pancreas: Unremarkable. Spleen: Unremarkable. Adrenals/Urinary Tract: Negative adrenals. Moderate bilateral hydroureteronephrosis which is greater than previously seen. Hydronephrosis is secondary to chronic marked over distension of the urinary bladder, extending to the umbilicus. The wall is chronically circumferentially thickened without acute surrounding inflammatory change. Small volume gas in the bladder which is nonspecific. Anticipate drainage and urinalysis. Reproductive:Small enhancing uterine masses, up to 21 mm, consistent with fibroids. Negative adnexa. Stomach/Bowel: No obstruction. No inflammatory change. No gastric dilatation. Vascular/Lymphatic: No acute vascular abnormality. No mass or adenopathy. Peritoneal: No ascites or pneumoperitoneum. Musculoskeletal: No acute abnormalities. IMPRESSION: 1. Chronically over distended bladder with progressive bilateral hydroureteronephrosis. 2. Chronic findings are stable and described above. Electronically Signed   By: Monte Fantasia M.D.   On: 05/31/2015 15:37   Ct Abdomen Pelvis W Contrast  05/14/2015  CLINICAL DATA:  Abdominal pain and nausea and vomiting. Elevated lipase. EXAM: CT ABDOMEN AND PELVIS WITH CONTRAST TECHNIQUE: Multidetector CT imaging of the abdomen and pelvis was performed using the standard protocol following bolus administration of intravenous contrast. CONTRAST:  35mL OMNIPAQUE IOHEXOL 300 MG/ML SOLN, 131mL OMNIPAQUE IOHEXOL 300 MG/ML SOLN COMPARISON:  Radiograph dated 05/08/2015 and CT scan dated 03/10/2015 FINDINGS: Lower chest: There is a new small right pleural effusion with slight atelectasis at the right lung base laterally and posteriorly. Small chronic hiatal hernia. Hepatobiliary: Cholecystectomy. Focal fatty infiltration in the left lobe adjacent to the falciform ligament. No biliary ductal dilatation. Pancreas: Normal.  Spleen: Normal. Adrenals/Urinary Tract: Normal adrenal glands and kidneys. Slight prominence of the ureters. Chronic distention of the bladder to the level of the umbilicus. The bladder has been abnormally distended dating back to 03/18/2010. Stomach/Bowel: Small hiatal hernia. Extensive stool in the nondistended colon. The sigmoid portion of the colon is compressed by the distended urinary bladder. There is stool in the rectum. Vascular/Lymphatic: Minimal calcification in the abdominal aorta. No adenopathy. Reproductive: 12 mm cyst on the left ovary. Small calcified fibroid in the left side of the uterine fundus. Other: No free air or free fluid. Musculoskeletal: Normal. IMPRESSION: 1. New small right pleural effusion with slight atelectasis at the right lung base. 2. Marked chronic distention of the urinary bladder with secondary slight dilatation of the ureters. The sigmoid portion of the colon is also compressed by the distended urinary bladder. 3. Normal appearing pancreas. Electronically Signed   By: Jeneen Rinks  Maxwell M.D.   On: 05/14/2015 09:12   Ir Fluoro Guide Cv Line Right  05/23/2015  CLINICAL DATA:  Diabetic ketoacidosis, needs venous access for IV therapies EXAM: PICC PLACEMENT WITH ULTRASOUND AND FLUOROSCOPY FLUOROSCOPY TIME:  0.1 minute, 11 uGym2 DAP TECHNIQUE: After written informed consent was obtained, patient was placed in the supine position on angiographic table. Patency of the right basilic vein was confirmed with ultrasound with image documentation. An appropriate skin site was determined. Skin site was marked. Region was prepped using maximum barrier technique including cap and mask, sterile gown, sterile gloves, large sterile sheet, and Chlorhexidine as cutaneous antisepsis. The region was infiltrated locally with 1% lidocaine. Under real-time ultrasound guidance, the right basilic vein was accessed with a 21 gauge micropuncture needle; the needle tip within the vein was confirmed with  ultrasound image documentation. Needle exchanged over a 018 guidewire for a peel-away sheath, through which a 5-French double-lumen power injectable PICC trimmed to 38cm was advanced, positioned with its tip near the cavoatrial junction. Spot chest radiograph confirms appropriate catheter position. Catheter was flushed per protocol and secured externally. The patient tolerated procedure well. COMPLICATIONS: COMPLICATIONS none IMPRESSION: 1. Technically successful five [place] double lumen power injectable PICC placement Electronically Signed   By: Lucrezia Europe M.D.   On: 05/23/2015 16:40   Ir US Guide Vasc Access Right  05/27/2015  ADDENDUM REPORT: 05/27/2015 10:51 ADDENDUM: Nesacaine was used as a local anesthetic rather than lidocaine due to an allergy. Electronically Signed   By: Markus Daft M.D.   On: 05/27/2015 10:51  05/27/2015  CLINICAL DATA:  49 year old with gastroparesis, persistent retching and poor venous access. EXAM: POWER PICC LINE PLACEMENT WITH ULTRASOUND AND FLUOROSCOPIC GUIDANCE FLUOROSCOPY TIME:  12 seconds, 2 mGy PROCEDURE: The patient was advised of the possible risks and complications and agreed to undergo the procedure. The patient was then brought to the angiographic suite for the procedure. The right arm was prepped with chlorhexidine, draped in the usual sterile fashion using maximum barrier technique (cap and mask, sterile gown, sterile gloves, large sterile sheet, hand hygiene and cutaneous antisepsis) and infiltrated locally with 1% Lidocaine. Ultrasound demonstrated patency of the right brachial vein, and this was documented with an image. Under real-time ultrasound guidance, this vein was accessed with a 21 gauge micropuncture needle and image documentation was performed. A 0.018 wire was introduced in to the vein. Over this, a 5 [place] dual lumen power injectable PICC was advanced to the lower SVC/right atrial junction. Fluoroscopy during the procedure and fluoro spot radiograph  confirms appropriate catheter position. The catheter was flushed and covered with a sterile dressing. Catheter length: 42 cm Complications: None Estimated blood loss: Minimal IMPRESSION: Successful right arm Power PICC line placement with ultrasound and fluoroscopic guidance. The catheter is ready for use. Electronically Signed: By: Markus Daft M.D. On: 05/27/2015 08:19   Ir US Guide Vasc Access Right  05/23/2015  CLINICAL DATA:  Diabetic ketoacidosis, needs venous access for IV therapies EXAM: PICC PLACEMENT WITH ULTRASOUND AND FLUOROSCOPY FLUOROSCOPY TIME:  0.1 minute, 11 uGym2 DAP TECHNIQUE: After written informed consent was obtained, patient was placed in the supine position on angiographic table. Patency of the right basilic vein was confirmed with ultrasound with image documentation. An appropriate skin site was determined. Skin site was marked. Region was prepped using maximum barrier technique including cap and mask, sterile gown, sterile gloves, large sterile sheet, and Chlorhexidine as cutaneous antisepsis. The region was infiltrated locally with 1% lidocaine. Under real-time  ultrasound guidance, the right basilic vein was accessed with a 21 gauge micropuncture needle; the needle tip within the vein was confirmed with ultrasound image documentation. Needle exchanged over a 018 guidewire for a peel-away sheath, through which a 5-French double-lumen power injectable PICC trimmed to 38cm was advanced, positioned with its tip near the cavoatrial junction. Spot chest radiograph confirms appropriate catheter position. Catheter was flushed per protocol and secured externally. The patient tolerated procedure well. COMPLICATIONS: COMPLICATIONS none IMPRESSION: 1. Technically successful five [place] double lumen power injectable PICC placement Electronically Signed   By: Lucrezia Europe M.D.   On: 05/23/2015 16:40   Dg Chest Port 1 View  05/17/2015  CLINICAL DATA:  Right IJ line placement.  Initial encounter.  EXAM: PORTABLE CHEST 1 VIEW COMPARISON:  Chest radiograph performed earlier today at 11:23 a.m. FINDINGS: The patient's right IJ line is noted ending overlying the right atrium. This could be retracted approximately 3 cm to the cavoatrial junction. The lungs are relatively well expanded and appear grossly clear. No focal consolidation, pleural effusion or pneumothorax is seen. The cardiomediastinal silhouette is normal in size. No acute osseous abnormalities are identified. Clips are noted within the right upper quadrant, reflecting prior cholecystectomy. IMPRESSION: 1. Right IJ line noted ending overlying the right atrium. This could be retracted approximately 3 cm to the cavoatrial junction. 2. No acute cardiopulmonary process seen. Electronically Signed   By: Garald Balding M.D.   On: 05/17/2015 21:45   Dg Abd 2 Views  05/17/2015  CLINICAL DATA:  Nausea and vomiting with hyperglycemia EXAM: ABDOMEN - 2 VIEW COMPARISON:  CT abdomen and pelvis May 14, 2015 FINDINGS: Supine and left lateral decubitus images were obtained. There is diffuse stool throughout the colon. No bowel dilatation or air-fluid level suggesting obstruction. No free air. There are surgical clips the right upper quadrant. Visualized lung bases are clear. No abnormal calcifications. IMPRESSION: No demonstrable obstruction or free air. Diffuse stool throughout colon. Electronically Signed   By: Lowella Grip III M.D.   On: 05/17/2015 11:45   Dg Abd Acute W/chest  05/23/2015  CLINICAL DATA:  49 year old female with chest pain cough and abdominal pain today. Initial encounter. EXAM: DG ABDOMEN ACUTE W/ 1V CHEST COMPARISON:  Chest radiographs 05/17/2015 and earlier. CT Abdomen and Pelvis 05/14/2015 FINDINGS: Semi upright AP view of the chest at 1137 hours. The lungs appear clear. Normal cardiac size and mediastinal contours. Visualized tracheal air column is within normal limits. No pneumothorax or pneumoperitoneum. No pneumoperitoneum  on the left-side-down lateral decubitus view of the abdomen. Non obstructed bowel gas pattern, retained stool from the descending colon to the rectum. Decompressed stomach. Stable cholecystectomy clips. Abdominal and pelvic visceral contours are within normal limits. No acute osseous abnormality identified. IMPRESSION: 1.  Normal bowel gas pattern, no free air. 2.  No acute cardiopulmonary abnormality. Electronically Signed   By: Genevie Ann M.D.   On: 05/23/2015 12:16   Ir Fluoro Guide Cv Midline Picc Right  05/27/2015  ADDENDUM REPORT: 05/27/2015 10:51 ADDENDUM: Nesacaine was used as a local anesthetic rather than lidocaine due to an allergy. Electronically Signed   By: Markus Daft M.D.   On: 05/27/2015 10:51  05/27/2015  CLINICAL DATA:  49 year old with gastroparesis, persistent retching and poor venous access. EXAM: POWER PICC LINE PLACEMENT WITH ULTRASOUND AND FLUOROSCOPIC GUIDANCE FLUOROSCOPY TIME:  12 seconds, 2 mGy PROCEDURE: The patient was advised of the possible risks and complications and agreed to undergo the procedure. The patient was then  brought to the angiographic suite for the procedure. The right arm was prepped with chlorhexidine, draped in the usual sterile fashion using maximum barrier technique (cap and mask, sterile gown, sterile gloves, large sterile sheet, hand hygiene and cutaneous antisepsis) and infiltrated locally with 1% Lidocaine. Ultrasound demonstrated patency of the right brachial vein, and this was documented with an image. Under real-time ultrasound guidance, this vein was accessed with a 21 gauge micropuncture needle and image documentation was performed. A 0.018 wire was introduced in to the vein. Over this, a 5 [place] dual lumen power injectable PICC was advanced to the lower SVC/right atrial junction. Fluoroscopy during the procedure and fluoro spot radiograph confirms appropriate catheter position. The catheter was flushed and covered with a sterile dressing. Catheter length:  42 cm Complications: None Estimated blood loss: Minimal IMPRESSION: Successful right arm Power PICC line placement with ultrasound and fluoroscopic guidance. The catheter is ready for use. Electronically Signed: By: Markus Daft M.D. On: 05/27/2015 08:19    Microbiology: Recent Results (from the past 240 hour(s))  MRSA PCR Screening     Status: None   Collection Time: 05/26/15  9:55 PM  Result Value Ref Range Status   MRSA by PCR NEGATIVE NEGATIVE Final    Comment:        The GeneXpert MRSA Assay (FDA approved for NASAL specimens only), is one component of a comprehensive MRSA colonization surveillance program. It is not intended to diagnose MRSA infection nor to guide or monitor treatment for MRSA infections.      Labs: Basic Metabolic Panel:  Recent Labs Lab 05/30/15 0442 05/31/15 0343 06/01/15 0645  NA 143 140 142  K 2.8* 4.0 4.7  CL 108 109 111  CO2 27 25 27   GLUCOSE 46* 239* 126*  BUN <5* <5* <5*  CREATININE 0.71 0.82 0.87  CALCIUM 8.4* 8.7* 8.6*  MG 1.3*  --  1.6*  PHOS  --   --  3.2   Liver Function Tests:  Recent Labs Lab 05/31/15 0343  AST 17  ALT 16  ALKPHOS 77  BILITOT 0.8  PROT 6.3*  ALBUMIN 3.1*    Recent Labs Lab 05/31/15 0343  LIPASE 23   No results for input(s): AMMONIA in the last 168 hours. CBC: No results for input(s): WBC, NEUTROABS, HGB, HCT, MCV, PLT in the last 168 hours. Cardiac Enzymes: No results for input(s): CKTOTAL, CKMB, CKMBINDEX, TROPONINI in the last 168 hours. BNP: BNP (last 3 results) No results for input(s): BNP in the last 8760 hours.  ProBNP (last 3 results) No results for input(s): PROBNP in the last 8760 hours.  CBG:  Recent Labs Lab 06/03/15 1632 06/03/15 2158 06/04/15 0620 06/04/15 0806 06/04/15 1136  GLUCAP 162* 304* 518* 365* 212*    Signed:  Velvet Bathe  [facility] 06/04/2015, 2:56 PM   Blood sugar was elevated earlier this AM. Has trended down after insulin  administration.  It was recommended that patient be discharged to SNF on d/c on PT evaluation. As such discontinued discharge to home yesterday and had nursing call social work for disposition planning.   Patient currently stable for discharge to SNF. Again will need to f/u with Urologist for urinary retention evaluation.

## 2015-06-04 NOTE — Evaluation (Signed)
Physical Therapy Evaluation Patient Details Name: [name] MRN: [id number] DOB: Dec 31, 1965 Today's Date: 06/04/2015   History of Present Illness  49 yo female admitted with severe gastroparesis. Hx of DM, gastroparesis.   Clinical Impression  On eval, pt required Min assist for mobility-stood at EOB x 2 (once without walker, once with walker). Attempted to have pt take steps but pt was unable. Pt c/o feeling weak. At this time, do not feel pt can safely manage at home at her current status. Recommend SNF for ST rehab.     Follow Up Recommendations SNF;Supervision - 24 hour supervision/assist (unless mobility improves significantly and/or pt will have 24 hour supervision/assist)    Equipment Recommendations  None recommended by PT    Recommendations for Other Services       Precautions / Restrictions Precautions Precautions: Fall Restrictions Weight Bearing Restrictions: No      Mobility  Bed Mobility Overal bed mobility: Needs Assistance Bed Mobility: Supine to Sit     Supine to sit: Min guard     General bed mobility comments: close guard for safety. Increased time.   Transfers Overall transfer level: Needs assistance Equipment used: Rolling walker (2 wheeled) Transfers: Sit to/from Stand Sit to Stand: Min assist         General transfer comment: Assist to rise, stabilize, control descent. VCs safety, hand placement. Pt stood x 2 for ~15-20 seconds before abruptly sitting each time.   Ambulation/Gait             General Gait Details: NT-pt unable to take any steps at time of eval  Stairs            Wheelchair Mobility    Modified Rankin (Stroke Patients Only)       Balance           Standing balance support: Bilateral upper extremity supported;During functional activity Standing balance-Leahy Scale: Poor                               Pertinent Vitals/Pain Pain Assessment: Faces Faces Pain Scale: Hurts little  more Pain Location: abdomen Pain Descriptors / Indicators: Sore Pain Intervention(s): Monitored during session    Home Living Family/patient expects to be discharged to:: Private residence Living Arrangements: Children Available Help at Discharge:  (son works at night) Type of Home: Apartment Home Access: Stairs to enter Entrance Stairs-Rails: Psychiatric nurse of Steps: flight   Home Equipment: None      Prior Function Level of Independence: Independent         Comments: reports she generally stays in apt, family assists with groceries.  Able to do adls but hard at times     Hand Dominance        Extremity/Trunk Assessment   Upper Extremity Assessment: Generalized weakness           Lower Extremity Assessment: Generalized weakness      Cervical / Trunk Assessment: Normal  Communication   Communication: No difficulties  Cognition Arousal/Alertness: Awake/alert Behavior During Therapy: WFL for tasks assessed/performed Overall Cognitive Status: Within Functional Limits for tasks assessed                      General Comments      Exercises        Assessment/Plan    PT Assessment Patient needs continued PT services  PT Diagnosis Difficulty walking;Generalized weakness;Acute pain   PT Problem  List Decreased strength;Decreased activity tolerance;Decreased balance;Decreased mobility;Pain  PT Treatment Interventions DME instruction;Gait training;Functional mobility training;Therapeutic activities;Therapeutic exercise;Patient/family education;Balance training   PT Goals (Current goals can be found in the Care Plan section) Acute Rehab PT Goals Patient Stated Goal: "to get my stress level down" PT Goal Formulation: With patient Time For Goal Achievement: 06/18/15 Potential to Achieve Goals: Fair    Frequency Min 3X/week   Barriers to discharge        Co-evaluation               End of Session Equipment Utilized  During Treatment: Gait belt Activity Tolerance: Patient limited by fatigue Patient left: in bed;with call bell/phone within reach;with bed alarm set           Time: 1530-1605 PT Time Calculation (min) (ACUTE ONLY): 35 min   Charges:   PT Evaluation $Initial PT Evaluation Tier I: 1 Procedure PT Treatments $Therapeutic Activity: 8-22 mins   PT G Codes:        [name], MPT Pager: [phone]

## 2015-06-04 NOTE — Progress Notes (Signed)
Spoke with Dr. Wendee Beavers about physical therapist's recommendation for SNF. At this point patient is in agreement to go to SNF, notified social worker, Leveda Anna as well. Will continue to monitor pt.

## 2015-06-04 NOTE — Progress Notes (Signed)
Pt blood sugar 518, NP Lynch paged, new orders given. Will continue to monitor pt.

## 2015-06-05 LAB — GLUCOSE, RANDOM: GLUCOSE: 589 mg/dL — AB (ref 65–99)

## 2015-06-05 LAB — GLUCOSE, CAPILLARY
GLUCOSE-CAPILLARY: 377 mg/dL — AB (ref 65–99)
Glucose-Capillary: 514 mg/dL — ABNORMAL HIGH (ref 65–99)
Glucose-Capillary: 269 mg/dL — ABNORMAL HIGH (ref 65–99)

## 2015-06-05 MED ORDER — SODIUM CHLORIDE 0.9 % IV SOLN
INTRAVENOUS | Status: DC
  Administered 2015-06-05: 12:00:00 via INTRAVENOUS

## 2015-06-05 MED ORDER — ALPRAZOLAM 0.25 MG PO TABS
0.2500 mg | ORAL_TABLET | Freq: Three times a day (TID) | ORAL | Status: DC | PRN
  Administered 2015-06-05: 0.25 mg via ORAL
  Filled 2015-06-05: qty 1

## 2015-06-05 MED ORDER — PREGABALIN 75 MG PO CAPS
150.0000 mg | ORAL_CAPSULE | Freq: Two times a day (BID) | ORAL | Status: DC
  Administered 2015-06-05: 150 mg via ORAL
  Filled 2015-06-05: qty 2

## 2015-06-05 NOTE — Progress Notes (Signed)
Attempted to call report to [facility] but nurse was on break.  Left name and number for her to return the call. Andre Lefort

## 2015-06-05 NOTE — Clinical Social Work Note (Signed)
Clinical Social Work Assessment  Patient Details  Name: [name] MRN: [id number] Date of Birth: 02-May-1966  Date of referral:  06/05/15               Reason for consult:  Facility Placement                Permission sought to share information with:  Family Supports, Chartered certified accountant granted to share information::  Yes, Verbal Permission Granted  Name::     Retail banker::  SNFs  Relationship::  Son  Sport and exercise psychologist Information:     Housing/Transportation Living arrangements for the past 2 months:  Single Family Home Source of Information:  Patient Patient Interpreter Needed:  None Criminal Activity/Legal Involvement Pertinent to Current Situation/Hospitalization:  No - Comment as needed Significant Relationships:  Adult Children Lives with:  Adult Children Do you feel safe going back to the place where you live?  Yes Need for family participation in patient care:  Yes (Comment)  Care giving concerns:  Patient states that she may be agreeable to SNF placement at discharge for rehab but wants more information about it.   Social Worker assessment / plan:  CSW met with patient at bedside to complete assessment. Patient states that she lives with her son at the moment and states that this is her main support. Patient states that she is agreeable to seeing what her rehab options will be at discharge. CSW explained SNF search/placement process and answered the patient's questions. CSW will followup with bed offers once available. Patient would like her son to be notified of bed offers once available as well.  Employment status:    Insurance information:  Managed Care PT Recommendations:  [place] / Referral to community resources:  [place]  Patient/Family's Response to care:  Patient and patient's family appear to be happy with the care the patient has received.  Patient/Family's Understanding of and Emotional  Response to Diagnosis, Current Treatment, and Prognosis:  Patient appears to have fair understanding of reason for admission and her post DC needs.   Emotional Assessment Appearance:  Appears stated age Attitude/Demeanor/Rapport:  Other (Patient appropriate and welcoming of CSW) Affect (typically observed):  Accepting, Appropriate, Calm Orientation:  Oriented to Self, Oriented to Place, Oriented to  Time, Oriented to Situation Alcohol / Substance use:  Never Used Psych involvement (Current and /or in the community):  No (Comment)  Discharge Needs  Concerns to be addressed:  Discharge Planning Concerns Readmission within the last 30 days:  No Current discharge risk:  Chronically ill, Physical Impairment Barriers to Discharge:  Continued Medical Work up   [employer] MSW, [place], [place], [phone]

## 2015-06-05 NOTE — Progress Notes (Signed)
CBG 514 according to meter.  Stat blood glucose ordered and MD notified. Andre Lefort

## 2015-06-05 NOTE — NC FL2 (Signed)
[place] LEVEL OF CARE SCREENING TOOL     IDENTIFICATION  Patient Name: [name] Birthdate: Nov 07, 1965 Sex: female Admission Date (Current Location): 05/26/2015  [facility] and [place] Number: Herbalist and Address:  [facility] [place], [place] [address], [place], [place] [address]      Provider Number: [id number]  Attending Physician Name and Address:  Velvet Bathe, MD  Relative Name and Phone Number:  Danny    Current Level of Care: Hospital Recommended Level of Care: [place] Prior Approval Number:    Date Approved/Denied:   PASRR Number: [id number]  Discharge Plan: SNF    Current Diagnoses: Patient Active Problem List   Diagnosis Date Noted  . Hydronephrosis 06/02/2015  . DKA (diabetic ketoacidoses) ([place]) 05/26/2015  . Anemia 05/26/2015  . Hypomagnesemia 05/26/2015  . DKA, type 1 ([place]) 05/23/2015  . Pressure ulcer 05/19/2015  . Uncontrolled type 2 diabetes mellitus with diabetic neuropathy, with long-term current use of insulin ([place]) 05/18/2015  . CKD (chronic kidney disease) stage 3, GFR 30-59 ml/min 05/18/2015  . Anemia of chronic kidney failure 05/18/2015  . Elevated troponin 05/18/2015  . Hypokalemia 05/18/2015  . Hypotension 05/18/2015  . Anxiety and depression 05/18/2015  . Nausea & vomiting   . Gastroparesis due to DM ([place]) 03/09/2015  . Diabetic ketoacidosis without coma associated with type 1 diabetes mellitus ([place])   . High anion gap metabolic acidosis [id number]    Orientation RESPIRATION BLADDER Height & Weight    Self, Time, Situation, Place  Normal Continent 5\' 5"  (165.1 cm) 125 lbs.  BEHAVIORAL SYMPTOMS/MOOD NEUROLOGICAL BOWEL NUTRITION STATUS   (NONE)  (NONE) Continent Diet (Carb Modified)  AMBULATORY STATUS COMMUNICATION OF NEEDS Skin   Extensive Assist Verbally PU Stage and Appropriate Care (Stage I left heel)                       Personal Care Assistance Level of  Assistance  Bathing, Dressing Bathing Assistance: Limited assistance   Dressing Assistance: Limited assistance     Functional Limitations Info   (NONE)          SPECIAL CARE FACTORS FREQUENCY  PT (By licensed PT)     PT Frequency: 5X              Contractures Contractures Info: Not present    Additional Factors Info  Code Status, Allergies Code Status Info: Full Allergies Info: Anesthetics, Penicillins, Buprenorphine HCL, Encainide, Metoclopramide           Current Medications (06/05/2015):  This is the current hospital active medication list Current Facility-Administered Medications  Medication Dose Route Frequency Provider Last Rate Last Dose  . ALPRAZolam (XANAX) tablet 0.25 mg  0.25 mg Oral TID PRN Velvet Bathe, MD      . bisacodyl (DULCOLAX) suppository 10 mg  10 mg Rectal Daily PRN Reubin Milan, MD      . enoxaparin (LOVENOX) injection 40 mg  40 mg Subcutaneous Q24H Nishant Dhungel, MD   40 mg at 06/04/15 1745  . hydrALAZINE (APRESOLINE) injection 10 mg  10 mg Intravenous Q4H PRN Gardiner Barefoot, NP   10 mg at 05/29/15 1627  . insulin aspart (novoLOG) injection 0-9 Units  0-9 Units Subcutaneous TID WC Nishant Dhungel, MD   15 Units at 06/05/15 0924  . insulin detemir (LEVEMIR) injection 8 Units  8 Units Subcutaneous Daily Velvet Bathe, MD   8 Units at 06/04/15 1037  . ondansetron (  ZOFRAN) injection 4 mg  4 mg Intravenous 4 times per day Alfredia Ferguson, PA-C   4 mg at 06/05/15 [id number]  . pantoprazole (PROTONIX) injection 40 mg  40 mg Intravenous Q12H Ladene Artist, MD   40 mg at 06/04/15 2258  . potassium & sodium phosphates (PHOS-NAK) 280-160-250 MG packet 1 packet  1 packet Oral TID WC & HS Nishant Dhungel, MD   1 packet at 06/05/15 0925  . pregabalin (LYRICA) capsule 150 mg  150 mg Oral BID Velvet Bathe, MD      . sodium chloride 0.9 % bolus 1,000 mL  1,000 mL Intravenous Once Tanna Furry, MD      . sodium chloride 0.9 % injection 10-40 mL  10-40 mL  Intracatheter Q12H Tanna Furry, MD   10 mL at 06/01/15 2238  . sodium chloride 0.9 % injection 10-40 mL  10-40 mL Intracatheter PRN Tanna Furry, MD   10 mL at 06/05/15 [id number]     Discharge Medications: Please see discharge summary for a list of discharge medications.  Relevant Imaging Results:  Relevant Lab Results:   Additional Information SSN: [id number]  Rigoberto Noel, LCSW

## 2015-06-05 NOTE — Progress Notes (Signed)
Pt reports she normally takes 150mg s of Lyrica BID and would like it ordered here in the hospital.  Pt noted to be drowsy (easily arousable) and continues to be receiving 2mg  of ativan q6 scheduled.  No outward evidence of pain.  Will continue to monitor

## 2015-06-05 NOTE — Clinical Social Work Placement (Signed)
   CLINICAL SOCIAL WORK PLACEMENT  NOTE  Date:  06/05/2015  Patient Details  Name: Katerra Ciufo MRN: [id number] Date of Birth: [birth year]  Clinical Social Work is seeking post-discharge placement for this patient at the [place] level of care (*CSW will initial, date and re-position this form in  chart as items are completed):  Yes   Patient/family provided with [place] Work Department's list of facilities offering this level of care within the geographic area requested by the patient (or if unable, by the patient's family).  Yes   Patient/family informed of their freedom to choose among providers that offer the needed level of care, that participate in Medicare, Medicaid or managed care program needed by the patient, have an available bed and are willing to accept the patient.  Yes   Patient/family informed of [facility]'s ownership interest in [facility] and [facility], as well as of the fact that they are under no obligation to receive care at these facilities.  PASRR submitted to EDS on 06/05/15     PASRR number received on 06/05/15     Existing PASRR number confirmed on       FL2 transmitted to all facilities in geographic area requested by pt/family on       FL2 transmitted to all facilities within larger geographic area on       Patient informed that his/her managed care company has contracts with or will negotiate with certain facilities, including the following:            Patient/family informed of bed offers received.  Patient chooses bed at       Physician recommends and patient chooses bed at      Patient to be transferred to   on  .  Patient to be transferred to facility by       Patient family notified on   of transfer.  Name of family member notified:        PHYSICIAN       Additional Comment:    _______________________________________________ Rigoberto Noel, LCSW 06/05/2015, 10:59 AM

## 2015-06-05 NOTE — Progress Notes (Signed)
CRITICAL VALUE ALERT  Critical value received:  589  Date of notification:  06/05/15  Time of notification:  0915  Critical value read back:Yes.    Nurse who received alert: Melodye Ped, RN  MD notified (1st page):  Dr. Wendee Beavers  Time of first page: 0915  MD notified (2nd page):  Time of second page:  Responding MD:  Dr. Wendee Beavers  Time MD responded:  [phone]

## 2015-06-05 NOTE — Clinical Social Work Placement (Signed)
   CLINICAL SOCIAL WORK PLACEMENT  NOTE  Date:  06/05/2015  Patient Details  Name: [name] MRN: [id number] Date of Birth: 1966/01/12  Clinical Social Work is seeking post-discharge placement for this patient at the [place] level of care (*CSW will initial, date and re-position this form in  chart as items are completed):  Yes   Patient/family provided with [place] Work Department's list of facilities offering this level of care within the geographic area requested by the patient (or if unable, by the patient's family).  Yes   Patient/family informed of their freedom to choose among providers that offer the needed level of care, that participate in Medicare, Medicaid or managed care program needed by the patient, have an available bed and are willing to accept the patient.  Yes   Patient/family informed of [facility]'s ownership interest in [facility] and [facility], as well as of the fact that they are under no obligation to receive care at these facilities.  PASRR submitted to EDS on 06/05/15     PASRR number received on 06/05/15     Existing PASRR number confirmed on       FL2 transmitted to all facilities in geographic area requested by pt/family on       FL2 transmitted to all facilities within larger geographic area on       Patient informed that his/her managed care company has contracts with or will negotiate with certain facilities, including the following:        Yes   Patient/family informed of bed offers received.  Patient chooses bed at [facility]     Physician recommends and patient chooses bed at      Patient to be transferred to [facility] on 06/05/15.  Patient to be transferred to facility by Ambulance     Patient family notified on 06/05/15 of transfer.  Name of family member notified:  Danny     PHYSICIAN Please prepare priority discharge summary, including medications, Please prepare  prescriptions, Please sign FL2     Additional Comment:  Per MD patient ready for DC to [facility]. RN, patient, patient's family Kasandra Knudsen), and facility notified of DC. RN given number for report. DC packet on chart. Ambulance transport requested for patient for 3PM. Patient initially anxious about going to facility but will discuss this with her son. Son Kasandra Knudsen states that he does not have the option of taking the patient home at this time. CSW signing off.   _______________________________________________ Liz Beach MSW, [place], [place], [phone]

## 2015-06-09 ENCOUNTER — Emergency Department (HOSPITAL_COMMUNITY)
Admission: EM | Admit: 2015-06-09 | Discharge: 2015-06-09 | Disposition: A | Discharge status: Home / Self Care | Payer: 59 | Attending: Emergency Medicine | Admitting: Emergency Medicine

## 2015-06-09 ENCOUNTER — Encounter (HOSPITAL_COMMUNITY): Payer: Self-pay

## 2015-06-09 DIAGNOSIS — Z794 Long term (current) use of insulin: Secondary | ICD-10-CM | POA: Insufficient documentation

## 2015-06-09 DIAGNOSIS — Z880 Allergy status to penicillin: Secondary | ICD-10-CM | POA: Insufficient documentation

## 2015-06-09 DIAGNOSIS — E1165 Type 2 diabetes mellitus with hyperglycemia: Secondary | ICD-10-CM | POA: Insufficient documentation

## 2015-06-09 DIAGNOSIS — Z79899 Other long term (current) drug therapy: Secondary | ICD-10-CM | POA: Insufficient documentation

## 2015-06-09 DIAGNOSIS — Z3201 Encounter for pregnancy test, result positive: Secondary | ICD-10-CM | POA: Insufficient documentation

## 2015-06-09 DIAGNOSIS — K3184 Gastroparesis: Secondary | ICD-10-CM

## 2015-06-09 DIAGNOSIS — R739 Hyperglycemia, unspecified: Secondary | ICD-10-CM

## 2015-06-09 DIAGNOSIS — R748 Abnormal levels of other serum enzymes: Secondary | ICD-10-CM

## 2015-06-09 LAB — URINE MICROSCOPIC-ADD ON

## 2015-06-09 LAB — COMPREHENSIVE METABOLIC PANEL
ALBUMIN: 4.5 g/dL (ref 3.5–5.0)
ALK PHOS: 100 U/L (ref 38–126)
ALT: 19 U/L (ref 14–54)
AST: 32 U/L (ref 15–41)
Anion gap: 17 — ABNORMAL HIGH (ref 5–15)
BILIRUBIN TOTAL: 0.9 mg/dL (ref 0.3–1.2)
BUN: 13 mg/dL (ref 6–20)
CO2: 21 mmol/L — ABNORMAL LOW (ref 22–32)
CREATININE: 0.91 mg/dL (ref 0.44–1.00)
Calcium: 9.9 mg/dL (ref 8.9–10.3)
Chloride: 95 mmol/L — ABNORMAL LOW (ref 101–111)
GFR calc Af Amer: >60 mL/min (ref >60)
GFR calc non Af Amer: >60 mL/min (ref >60)
GLUCOSE: 302 mg/dL — AB (ref 65–99)
POTASSIUM: 4.3 mmol/L (ref 3.5–5.1)
Sodium: 133 mmol/L — ABNORMAL LOW (ref 135–145)
TOTAL PROTEIN: 8.6 g/dL — AB (ref 6.5–8.1)

## 2015-06-09 LAB — CBG MONITORING, ED
GLUCOSE-CAPILLARY: 356 mg/dL — AB (ref 65–99)
Glucose-Capillary: 228 mg/dL — ABNORMAL HIGH (ref 65–99)

## 2015-06-09 LAB — CBC
HEMATOCRIT: 34.8 % — AB (ref 36.0–46.0)
Hemoglobin: 11.3 g/dL — ABNORMAL LOW (ref 12.0–15.0)
MCH: 27.7 pg (ref 26.0–34.0)
MCHC: 32.5 g/dL (ref 30.0–36.0)
MCV: 85.3 fL (ref 78.0–100.0)
Platelets: 493 10*3/uL — ABNORMAL HIGH (ref 150–400)
RBC: 4.08 MIL/uL (ref 3.87–5.11)
RDW: 15.8 % — AB (ref 11.5–15.5)
WBC: 5.5 10*3/uL (ref 4.0–10.5)

## 2015-06-09 LAB — URINALYSIS, ROUTINE W REFLEX MICROSCOPIC
BILIRUBIN URINE: NEGATIVE
KETONES UR: 40 mg/dL — AB
Leukocytes, UA: NEGATIVE
NITRITE: NEGATIVE
PH: 6 (ref 5.0–8.0)
Protein, ur: 30 mg/dL — AB
Specific Gravity, Urine: 1.021 (ref 1.005–1.030)

## 2015-06-09 LAB — HCG, QUANTITATIVE, PREGNANCY: hCG, Beta Chain, Quant, S: 2 m[IU]/mL (ref <5)

## 2015-06-09 LAB — LIPASE, BLOOD: Lipase: 73 U/L — ABNORMAL HIGH (ref 11–51)

## 2015-06-09 MED ORDER — PROMETHAZINE HCL 25 MG/ML IJ SOLN
25.0000 mg | Freq: Once | INTRAMUSCULAR | Status: AC
  Administered 2015-06-09: 25 mg via INTRAVENOUS
  Filled 2015-06-09: qty 1

## 2015-06-09 MED ORDER — INSULIN ASPART 100 UNIT/ML ~~LOC~~ SOLN
10.0000 [IU] | Freq: Once | SUBCUTANEOUS | Status: AC
  Administered 2015-06-09: 10 [IU] via SUBCUTANEOUS
  Filled 2015-06-09: qty 1

## 2015-06-09 MED ORDER — ONDANSETRON HCL 4 MG/2ML IJ SOLN
4.0000 mg | Freq: Once | INTRAMUSCULAR | Status: AC | PRN
  Administered 2015-06-09: 4 mg via INTRAVENOUS
  Filled 2015-06-09: qty 2

## 2015-06-09 MED ORDER — SODIUM CHLORIDE 0.9 % IV BOLUS (SEPSIS)
1000.0000 mL | Freq: Once | INTRAVENOUS | Status: AC
  Administered 2015-06-09: 1000 mL via INTRAVENOUS

## 2015-06-09 MED ORDER — LORAZEPAM 2 MG/ML IJ SOLN
1.0000 mg | Freq: Once | INTRAMUSCULAR | Status: AC
  Administered 2015-06-09: 1 mg via INTRAVENOUS
  Filled 2015-06-09: qty 1

## 2015-06-09 MED ORDER — OXYCODONE-ACETAMINOPHEN 5-325 MG PO TABS
1.0000 | ORAL_TABLET | Freq: Once | ORAL | Status: AC
  Administered 2015-06-09: 1 via ORAL
  Filled 2015-06-09: qty 1

## 2015-06-09 NOTE — Progress Notes (Signed)
Pt confimred with ED CM that her pcp remains as listed in EPIC as Dr Jonelle Sidle

## 2015-06-09 NOTE — ED Provider Notes (Signed)
CSN: [id number]     Arrival date & time 06/09/15  1303 History   First MD Initiated Contact with Patient 06/09/15 1319     Chief Complaint  Patient presents with  . Emesis    SINCE 3AM  . Abdominal Pain    CHRONIC HX OF GASTROPARESIS     (Consider location/radiation/quality/duration/timing/severity/associated sxs/prior Treatment) HPI Comments: Pt comes in with c/o vomiting and abdominal pain associated with gastroparesis. No fever or diarrhea. She states that she had phenergan this morning without relief. She states that she has had her sugar checked at her facility but she is unsure what it is.   The history is provided by the patient. No language interpreter was used.    Past Medical History  Diagnosis Date  . Diabetes mellitus without complication ([place])   . Gastroparesis    Past Surgical History  Procedure Laterality Date  . Retinal detachment surgery    . Posterior vitrectomy and membrane peel-left eye  09/28/2002  . Posterior vitrectomy and membrane peel-right eye  03/16/2002  . Eye surgery    . Gailstones    . Esophagogastroduodenoscopy  09/27/2014    at [facility], Dr Rolan Lipa. biospy neg for celiac, neg for H pylori.   . Colonoscopy  09/27/2014    at [facility]   Family History  Problem Relation Age of Onset  . Cystic fibrosis Mother   . Hypertension Father   . Diabetes Brother   . Hypertension Maternal Grandmother    Social History  Substance Use Topics  . Smoking status: Never Smoker   . Smokeless tobacco: Never Used  . Alcohol Use: No   OB History    No data available     Review of Systems  All other systems reviewed and are negative.     Allergies  Anesthetics, amide; Penicillins; Buprenorphine hcl; Encainide; and Metoclopramide  Home Medications   Prior to Admission medications   Medication Sig Start Date End Date Taking? Authorizing Provider  ALPRAZolam (XANAX) 0.25 MG tablet Take 1 tablet (0.25 mg total) by mouth 3 (three) times daily as needed  for anxiety. Patient taking differently: Take 0.25 mg by mouth at bedtime as needed for sleep.  05/20/15  Yes Robbie Lis, MD  dronabinol (MARINOL) 10 MG capsule Take 10 mg by mouth 2 (two) times daily. 02/04/15  Yes Historical Provider, MD  FLUoxetine (PROZAC) 40 MG capsule Take 40 mg by mouth daily.   Yes Historical Provider, MD  insulin aspart (NOVOLOG) 100 UNIT/ML injection Inject 0-15 Units into the skin 3 (three) times daily with meals. 6-15 units three times daily with food.  Per sliding scale Patient taking differently: Inject 0-15 Units into the skin 3 (three) times daily with meals. 2-15 units three times daily with food. Per sliding scale 03/12/15  Yes Belkys A Regalado, MD  insulin detemir (LEVEMIR) 100 UNIT/ML injection Inject 0.1 mLs (10 Units total) into the skin at bedtime. 06/04/15  Yes Velvet Bathe, MD  ondansetron (ZOFRAN ODT) 4 MG disintegrating tablet Take 1 tablet (4 mg total) by mouth every 8 (eight) hours as needed for nausea. 05/20/15  Yes Robbie Lis, MD  pantoprazole (PROTONIX) 40 MG tablet Take 40 mg by mouth daily.  01/04/15  Yes Historical Provider, MD  pregabalin (LYRICA) 150 MG capsule Take 1 capsule (150 mg total) by mouth 2 (two) times daily. 02/17/13  Yes Robbie Lis, MD  promethazine (PHENERGAN) 25 MG suppository Place 1 suppository (25 mg total) rectally every 6 (six)  hours as needed for nausea or vomiting. 04/27/15  Yes Samantha Tripp Dowless, PA-C  promethazine (PHENERGAN) 25 MG tablet Take 1 tablet (25 mg total) by mouth every 6 (six) hours as needed for nausea or vomiting. 04/27/15  Yes Samantha Tripp Dowless, PA-C   LMP 05/31/2015 Physical Exam  Constitutional: She is oriented to person, place, and time. She appears well-developed and well-nourished.  Cardiovascular: Normal rate and regular rhythm.   Pulmonary/Chest: Effort normal and breath sounds normal.  Abdominal: Soft. Bowel sounds are normal. There is tenderness in the epigastric area.   Musculoskeletal: Normal range of motion.  Neurological: She is alert and oriented to person, place, and time.  Skin: Skin is warm and dry.  Psychiatric: She has a normal mood and affect.  Nursing note and vitals reviewed.   ED Course  Procedures (including critical care time) Labs Review Labs Reviewed  LIPASE, BLOOD - Abnormal; Notable for the following:    Lipase 73 (*)    All other components within normal limits  COMPREHENSIVE METABOLIC PANEL - Abnormal; Notable for the following:    Sodium 133 (*)    Chloride 95 (*)    CO2 21 (*)    Glucose, Bld 302 (*)    Total Protein 8.6 (*)    Anion gap 17 (*)    All other components within normal limits  CBC - Abnormal; Notable for the following:    Hemoglobin 11.3 (*)    HCT 34.8 (*)    RDW 15.8 (*)    Platelets 493 (*)    All other components within normal limits  CBG MONITORING, ED - Abnormal; Notable for the following:    Glucose-Capillary 356 (*)    All other components within normal limits  HCG, QUANTITATIVE, PREGNANCY  URINALYSIS, ROUTINE W REFLEX MICROSCOPIC (NOT AT [facility])    Imaging Review No results found. I have personally reviewed and evaluated these images and lab results as part of my medical decision-making.   EKG Interpretation None      MDM   Final diagnoses:  Gastroparesis  Hyperglycemia  Elevated lipase    4:08 PM Pt is not vomiting at this time. Pt to get dose of insulin. She is left with Irena Cords [facility]    Glendell Docker, NP 06/09/15 1609  Lacretia Leigh, MD 06/13/15 2245

## 2015-06-09 NOTE — ED Notes (Signed)
Bed: WA06 Expected date:  Expected time:  Means of arrival:  Comments: 49 y/o F [place]

## 2015-06-09 NOTE — ED Notes (Signed)
PT DISCHARGED. INSTRUCTIONS GIVEN TO PTAR TRANSPORT TEAM. AAOX3. PT IN NO APPARENT DISTRESS OR PAIN. THE OPPORTUNITY TO ASK QUESTIONS WAS PROVIDED.

## 2015-06-09 NOTE — ED Notes (Signed)
PT RECEIVED VIA EMS C/O N/V AND UPPER ABDOMINAL PAIN SINCE 3AM. PT WAS GIVEN A PHENERGAN SUPP AT 8AM WITH NO RELIEF. PT STATES SHE HAS A HX OF GASTROPARESIS.

## 2015-06-09 NOTE — ED Notes (Signed)
Pt cannot use restroom ar this time, aware specimen is needed.

## 2015-06-14 ENCOUNTER — Encounter (HOSPITAL_COMMUNITY): Payer: Self-pay | Admitting: *Deleted

## 2015-06-14 ENCOUNTER — Inpatient Hospital Stay (HOSPITAL_COMMUNITY)
Admission: EM | Admit: 2015-06-14 | Discharge: 2015-06-20 | DRG: 638 | Disposition: A | Discharge status: Home / Self Care | Payer: Self-pay | Attending: Family Medicine | Admitting: Family Medicine

## 2015-06-14 DIAGNOSIS — E1143 Type 2 diabetes mellitus with diabetic autonomic (poly)neuropathy: Secondary | ICD-10-CM

## 2015-06-14 DIAGNOSIS — E872 Acidosis, unspecified: Secondary | ICD-10-CM

## 2015-06-14 DIAGNOSIS — Z79899 Other long term (current) drug therapy: Secondary | ICD-10-CM

## 2015-06-14 DIAGNOSIS — R109 Unspecified abdominal pain: Secondary | ICD-10-CM

## 2015-06-14 DIAGNOSIS — N183 Chronic kidney disease, stage 3 (moderate): Secondary | ICD-10-CM | POA: Diagnosis present

## 2015-06-14 DIAGNOSIS — Z452 Encounter for adjustment and management of vascular access device: Secondary | ICD-10-CM

## 2015-06-14 DIAGNOSIS — N1330 Unspecified hydronephrosis: Secondary | ICD-10-CM | POA: Diagnosis present

## 2015-06-14 DIAGNOSIS — R300 Dysuria: Secondary | ICD-10-CM | POA: Diagnosis present

## 2015-06-14 DIAGNOSIS — K3184 Gastroparesis: Secondary | ICD-10-CM | POA: Diagnosis present

## 2015-06-14 DIAGNOSIS — E1040 Type 1 diabetes mellitus with diabetic neuropathy, unspecified: Secondary | ICD-10-CM

## 2015-06-14 DIAGNOSIS — F419 Anxiety disorder, unspecified: Secondary | ICD-10-CM | POA: Diagnosis present

## 2015-06-14 DIAGNOSIS — E1122 Type 2 diabetes mellitus with diabetic chronic kidney disease: Secondary | ICD-10-CM | POA: Diagnosis present

## 2015-06-14 DIAGNOSIS — D509 Iron deficiency anemia, unspecified: Secondary | ICD-10-CM | POA: Diagnosis present

## 2015-06-14 DIAGNOSIS — Z794 Long term (current) use of insulin: Secondary | ICD-10-CM

## 2015-06-14 DIAGNOSIS — E1165 Type 2 diabetes mellitus with hyperglycemia: Secondary | ICD-10-CM | POA: Diagnosis present

## 2015-06-14 DIAGNOSIS — E1010 Type 1 diabetes mellitus with ketoacidosis without coma: Secondary | ICD-10-CM | POA: Diagnosis present

## 2015-06-14 DIAGNOSIS — Z8249 Family history of ischemic heart disease and other diseases of the circulatory system: Secondary | ICD-10-CM

## 2015-06-14 DIAGNOSIS — I129 Hypertensive chronic kidney disease with stage 1 through stage 4 chronic kidney disease, or unspecified chronic kidney disease: Secondary | ICD-10-CM | POA: Diagnosis present

## 2015-06-14 DIAGNOSIS — Z833 Family history of diabetes mellitus: Secondary | ICD-10-CM

## 2015-06-14 DIAGNOSIS — E1310 Other specified diabetes mellitus with ketoacidosis without coma: Principal | ICD-10-CM

## 2015-06-14 DIAGNOSIS — D631 Anemia in chronic kidney disease: Secondary | ICD-10-CM | POA: Diagnosis present

## 2015-06-14 DIAGNOSIS — N390 Urinary tract infection, site not specified: Secondary | ICD-10-CM | POA: Diagnosis present

## 2015-06-14 LAB — URINE MICROSCOPIC-ADD ON

## 2015-06-14 LAB — URINALYSIS, ROUTINE W REFLEX MICROSCOPIC
BILIRUBIN URINE: NEGATIVE
Nitrite: POSITIVE — AB
PH: 6 (ref 5.0–8.0)
Protein, ur: NEGATIVE mg/dL
Specific Gravity, Urine: 1.025 (ref 1.005–1.030)

## 2015-06-14 LAB — BASIC METABOLIC PANEL
ANION GAP: 16 — AB (ref 5–15)
Anion gap: 10 (ref 5–15)
BUN: 14 mg/dL (ref 6–20)
BUN: 13 mg/dL (ref 6–20)
CALCIUM: 9.5 mg/dL (ref 8.9–10.3)
CALCIUM: 9 mg/dL (ref 8.9–10.3)
CO2: 15 mmol/L — ABNORMAL LOW (ref 22–32)
CO2: 21 mmol/L — AB (ref 22–32)
CREATININE: 1.11 mg/dL — AB (ref 0.44–1.00)
CREATININE: 1 mg/dL (ref 0.44–1.00)
Chloride: 109 mmol/L (ref 101–111)
Chloride: 112 mmol/L — ABNORMAL HIGH (ref 101–111)
GFR calc Af Amer: >60 mL/min (ref >60)
GFR calc Af Amer: >60 mL/min (ref >60)
GFR, EST NON AFRICAN AMERICAN: 58 mL/min — AB (ref >60)
GLUCOSE: 327 mg/dL — AB (ref 65–99)
GLUCOSE: 178 mg/dL — AB (ref 65–99)
Potassium: 4.1 mmol/L (ref 3.5–5.1)
Potassium: 3.7 mmol/L (ref 3.5–5.1)
Sodium: 140 mmol/L (ref 135–145)
Sodium: 143 mmol/L (ref 135–145)

## 2015-06-14 LAB — CBC
HEMATOCRIT: 32.8 % — AB (ref 36.0–46.0)
Hemoglobin: 10.4 g/dL — ABNORMAL LOW (ref 12.0–15.0)
MCH: 27.2 pg (ref 26.0–34.0)
MCHC: 31.7 g/dL (ref 30.0–36.0)
MCV: 85.6 fL (ref 78.0–100.0)
PLATELETS: 527 10*3/uL — AB (ref 150–400)
RBC: 3.83 MIL/uL — ABNORMAL LOW (ref 3.87–5.11)
RDW: 15.9 % — AB (ref 11.5–15.5)
WBC: 8.8 10*3/uL (ref 4.0–10.5)

## 2015-06-14 LAB — GLUCOSE, CAPILLARY
GLUCOSE-CAPILLARY: 281 mg/dL — AB (ref 65–99)
GLUCOSE-CAPILLARY: 218 mg/dL — AB (ref 65–99)
GLUCOSE-CAPILLARY: 142 mg/dL — AB (ref 65–99)
Glucose-Capillary: 326 mg/dL — ABNORMAL HIGH (ref 65–99)
Glucose-Capillary: 172 mg/dL — ABNORMAL HIGH (ref 65–99)

## 2015-06-14 LAB — COMPREHENSIVE METABOLIC PANEL
ALBUMIN: 4.2 g/dL (ref 3.5–5.0)
ALT: 17 U/L (ref 14–54)
AST: 33 U/L (ref 15–41)
Alkaline Phosphatase: 96 U/L (ref 38–126)
Anion gap: 20 — ABNORMAL HIGH (ref 5–15)
BILIRUBIN TOTAL: 1.4 mg/dL — AB (ref 0.3–1.2)
BUN: 13 mg/dL (ref 6–20)
CHLORIDE: 99 mmol/L — AB (ref 101–111)
CO2: 17 mmol/L — ABNORMAL LOW (ref 22–32)
CREATININE: 1.04 mg/dL — AB (ref 0.44–1.00)
Calcium: 10 mg/dL (ref 8.9–10.3)
GFR calc Af Amer: >60 mL/min (ref >60)
GFR calc non Af Amer: >60 mL/min (ref >60)
GLUCOSE: 442 mg/dL — AB (ref 65–99)
POTASSIUM: 4.3 mmol/L (ref 3.5–5.1)
Sodium: 136 mmol/L (ref 135–145)
Total Protein: 8 g/dL (ref 6.5–8.1)

## 2015-06-14 LAB — BLOOD GAS, VENOUS
Acid-base deficit: 9.9 mmol/L — ABNORMAL HIGH (ref 0.0–2.0)
Bicarbonate: 14.8 meq/L — ABNORMAL LOW (ref 20.0–24.0)
O2 Saturation: 26 %
Patient temperature: 98.6
TCO2: 14 mmol/L (ref 0–100)
pCO2, Ven: 30 mmHg — ABNORMAL LOW (ref 45.0–50.0)
pH, Ven: 7.313 — ABNORMAL HIGH (ref 7.250–7.300)
pO2, Ven: 21.7 mmHg — CL (ref 30.0–45.0)

## 2015-06-14 LAB — CBG MONITORING, ED
Glucose-Capillary: 435 mg/dL — ABNORMAL HIGH (ref 65–99)
Glucose-Capillary: 454 mg/dL — ABNORMAL HIGH (ref 65–99)
Glucose-Capillary: 378 mg/dL — ABNORMAL HIGH (ref 65–99)

## 2015-06-14 LAB — I-STAT CG4 LACTIC ACID, ED: LACTIC ACID, VENOUS: 5.64 mmol/L — AB (ref 0.5–2.0)

## 2015-06-14 LAB — MRSA PCR SCREENING: MRSA by PCR: NEGATIVE

## 2015-06-14 LAB — LIPASE, BLOOD: LIPASE: 26 U/L (ref 11–51)

## 2015-06-14 LAB — MAGNESIUM: Magnesium: 1.9 mg/dL (ref 1.7–2.4)

## 2015-06-14 MED ORDER — PREGABALIN 75 MG PO CAPS
150.0000 mg | ORAL_CAPSULE | Freq: Two times a day (BID) | ORAL | Status: DC
  Administered 2015-06-15 – 2015-06-20 (×8): 150 mg via ORAL
  Filled 2015-06-14 (×10): qty 2

## 2015-06-14 MED ORDER — DRONABINOL 5 MG PO CAPS
10.0000 mg | ORAL_CAPSULE | Freq: Two times a day (BID) | ORAL | Status: DC
  Administered 2015-06-14 – 2015-06-16 (×4): 10 mg via ORAL
  Filled 2015-06-14 (×4): qty 4

## 2015-06-14 MED ORDER — CEFTRIAXONE SODIUM 1 G IJ SOLR: 1.0000 g | Freq: Once | INTRAMUSCULAR | Status: DC

## 2015-06-14 MED ORDER — ONDANSETRON HCL 4 MG/2ML IJ SOLN
4.0000 mg | Freq: Once | INTRAMUSCULAR | Status: AC
Start: 2015-06-14 — End: 2015-06-14
  Administered 2015-06-14: 4 mg via INTRAVENOUS
  Filled 2015-06-14: qty 2

## 2015-06-14 MED ORDER — SODIUM CHLORIDE 0.9 % IV SOLN
INTRAVENOUS | Status: DC
  Filled 2015-06-14: qty 2.5

## 2015-06-14 MED ORDER — PROMETHAZINE HCL 25 MG PO TABS
12.5000 mg | ORAL_TABLET | Freq: Four times a day (QID) | ORAL | Status: DC | PRN
  Administered 2015-06-14: 12.5 mg via ORAL
  Filled 2015-06-14: qty 1

## 2015-06-14 MED ORDER — LORAZEPAM 2 MG/ML IJ SOLN
1.0000 mg | Freq: Once | INTRAMUSCULAR | Status: AC
  Administered 2015-06-14: 1 mg via INTRAVENOUS
  Filled 2015-06-14: qty 1

## 2015-06-14 MED ORDER — INSULIN REGULAR BOLUS VIA INFUSION
0.0000 [IU] | Freq: Three times a day (TID) | INTRAVENOUS | Status: DC
  Filled 2015-06-14: qty 10

## 2015-06-14 MED ORDER — PROMETHAZINE HCL 25 MG/ML IJ SOLN
25.0000 mg | Freq: Once | INTRAMUSCULAR | Status: AC
  Administered 2015-06-14: 25 mg via INTRAVENOUS
  Filled 2015-06-14: qty 1

## 2015-06-14 MED ORDER — ALPRAZOLAM 0.25 MG PO TABS
0.2500 mg | ORAL_TABLET | Freq: Three times a day (TID) | ORAL | Status: DC | PRN
  Administered 2015-06-14 – 2015-06-18 (×7): 0.25 mg via ORAL
  Filled 2015-06-14 (×7): qty 1

## 2015-06-14 MED ORDER — SODIUM CHLORIDE 0.9 % IV BOLUS (SEPSIS): 1000.0000 mL | Freq: Once | INTRAVENOUS | Status: DC

## 2015-06-14 MED ORDER — ACETAMINOPHEN 325 MG PO TABS
650.0000 mg | ORAL_TABLET | Freq: Four times a day (QID) | ORAL | Status: DC | PRN
  Administered 2015-06-16 – 2015-06-17 (×2): 650 mg via ORAL
  Filled 2015-06-14 (×2): qty 2

## 2015-06-14 MED ORDER — SODIUM CHLORIDE 0.9 % IV SOLN
INTRAVENOUS | Status: DC
  Administered 2015-06-14: 3.9 [IU]/h via INTRAVENOUS
  Filled 2015-06-14: qty 2.5

## 2015-06-14 MED ORDER — SODIUM CHLORIDE 0.9 % IV SOLN
INTRAVENOUS | Status: DC
  Administered 2015-06-17 – 2015-06-18 (×2): via INTRAVENOUS
  Administered 2015-06-19: 50 mL/h via INTRAVENOUS

## 2015-06-14 MED ORDER — HYDRALAZINE HCL 20 MG/ML IJ SOLN
10.0000 mg | Freq: Once | INTRAMUSCULAR | Status: AC
  Administered 2015-06-14: 10 mg via INTRAVENOUS
  Filled 2015-06-14: qty 1

## 2015-06-14 MED ORDER — FLUOXETINE HCL 20 MG PO CAPS
40.0000 mg | ORAL_CAPSULE | Freq: Every day | ORAL | Status: DC
  Administered 2015-06-14 – 2015-06-20 (×7): 40 mg via ORAL
  Filled 2015-06-14 (×7): qty 2

## 2015-06-14 MED ORDER — DEXTROSE 5 % IV SOLN
1.0000 g | INTRAVENOUS | Status: DC
  Administered 2015-06-14 – 2015-06-19 (×6): 1 g via INTRAVENOUS
  Filled 2015-06-14 (×6): qty 10

## 2015-06-14 MED ORDER — DEXTROSE-NACL 5-0.45 % IV SOLN
INTRAVENOUS | Status: DC
  Administered 2015-06-14 – 2015-06-15 (×2): via INTRAVENOUS

## 2015-06-14 MED ORDER — SODIUM CHLORIDE 0.9 % IV BOLUS (SEPSIS)
2000.0000 mL | Freq: Once | INTRAVENOUS | Status: AC
  Administered 2015-06-14: 2000 mL via INTRAVENOUS

## 2015-06-14 MED ORDER — DEXTROSE 50 % IV SOLN
25.0000 mL | INTRAVENOUS | Status: DC | PRN
  Filled 2015-06-14: qty 50

## 2015-06-14 MED ORDER — ENOXAPARIN SODIUM 30 MG/0.3ML ~~LOC~~ SOLN
30.0000 mg | SUBCUTANEOUS | Status: DC
  Administered 2015-06-14: 30 mg via SUBCUTANEOUS
  Filled 2015-06-14: qty 0.3

## 2015-06-14 MED ORDER — ACETAMINOPHEN 650 MG RE SUPP: 650.0000 mg | Freq: Four times a day (QID) | RECTAL | Status: DC | PRN

## 2015-06-14 NOTE — ED Provider Notes (Signed)
CSN: [id number]     Arrival date & time 06/14/15  1220 History   First MD Initiated Contact with Patient 06/14/15 1500     Chief Complaint  Patient presents with  . Nausea  . Abdominal Pain     (Consider location/radiation/quality/duration/timing/severity/associated sxs/prior Treatment) HPI Comments: 49 year old female with past medical history including IDDM c/b gastroparesis who p/w vomiting and abdominal pain. History obtained with the assistance of EMS. They report that the patient was sent from her facility at [facility] for IV fluids. She has had nausea, vomiting, dry heaves, and 10/10 constant abdominal pain located in her mid epigastrium that began yesterday. These symptoms are consistent with previous episodes of gastroparesis. She denies any fever, cough/cold symptoms, or diarrhea. She endorses mild dysuria. She was given oral Zofran and IM Phenergan at her facility without relief.  Patient is a 49 y.o. female presenting with abdominal pain. The history is provided by the patient.  Abdominal Pain   Past Medical History  Diagnosis Date  . Diabetes mellitus without complication ([place])   . Gastroparesis    Past Surgical History  Procedure Laterality Date  . Retinal detachment surgery    . Posterior vitrectomy and membrane peel-left eye  09/28/2002  . Posterior vitrectomy and membrane peel-right eye  03/16/2002  . Eye surgery    . Gailstones    . Esophagogastroduodenoscopy  09/27/2014    at [facility], Dr Rolan Lipa. biospy neg for celiac, neg for H pylori.   . Colonoscopy  09/27/2014    at [facility]   Family History  Problem Relation Age of Onset  . Cystic fibrosis Mother   . Hypertension Father   . Diabetes Brother   . Hypertension Maternal Grandmother    Social History  Substance Use Topics  . Smoking status: Never Smoker   . Smokeless tobacco: Never Used  . Alcohol Use: No   OB History    No data available     Review of Systems  Gastrointestinal: Positive for  abdominal pain.   10 Systems reviewed and are negative for acute change except as noted in the HPI.    Allergies  Anesthetics, amide; Penicillins; Buprenorphine hcl; Encainide; and Metoclopramide  Home Medications   Prior to Admission medications   Medication Sig Start Date End Date Taking? Authorizing Provider  ALPRAZolam (XANAX) 0.25 MG tablet Take 1 tablet (0.25 mg total) by mouth 3 (three) times daily as needed for anxiety. Patient taking differently: Take 0.25 mg by mouth at bedtime as needed for sleep.  05/20/15   Robbie Lis, MD  dronabinol (MARINOL) 10 MG capsule Take 10 mg by mouth 2 (two) times daily. 02/04/15   Historical Provider, MD  FLUoxetine (PROZAC) 40 MG capsule Take 40 mg by mouth daily.    Historical Provider, MD  insulin aspart (NOVOLOG) 100 UNIT/ML injection Inject 0-15 Units into the skin 3 (three) times daily with meals. 6-15 units three times daily with food.  Per sliding scale Patient taking differently: Inject 0-15 Units into the skin 3 (three) times daily with meals. 2-15 units three times daily with food. Per sliding scale 03/12/15   Belkys A Regalado, MD  insulin detemir (LEVEMIR) 100 UNIT/ML injection Inject 0.1 mLs (10 Units total) into the skin at bedtime. 06/04/15   Velvet Bathe, MD  ondansetron (ZOFRAN ODT) 4 MG disintegrating tablet Take 1 tablet (4 mg total) by mouth every 8 (eight) hours as needed for nausea. 05/20/15   Robbie Lis, MD  pantoprazole (PROTONIX) 40  MG tablet Take 40 mg by mouth daily.  01/04/15   Historical Provider, MD  pregabalin (LYRICA) 150 MG capsule Take 1 capsule (150 mg total) by mouth 2 (two) times daily. 02/17/13   Robbie Lis, MD  promethazine (PHENERGAN) 25 MG suppository Place 1 suppository (25 mg total) rectally every 6 (six) hours as needed for nausea or vomiting. 04/27/15   Samantha Tripp Dowless, PA-C  promethazine (PHENERGAN) 25 MG tablet Take 1 tablet (25 mg total) by mouth every 6 (six) hours as needed for nausea or  vomiting. 04/27/15   Samantha Tripp Dowless, PA-C   BP 160/93 mmHg  Pulse 120  Temp(Src) 97.8 F (36.6 C) (Oral)  Resp 20  SpO2 99%  LMP 05/31/2015 Physical Exam  Constitutional: She is oriented to person, place, and time. She appears well-developed and well-nourished.  Shaking, in moderate distress but nontoxic  HENT:  Head: Normocephalic and atraumatic.  dry mucous membranes  Eyes: Conjunctivae are normal. Pupils are equal, round, and reactive to light.  Neck: Neck supple.  Cardiovascular: Regular rhythm and normal heart sounds.   No murmur heard. tachycardic  Pulmonary/Chest: Breath sounds normal. No respiratory distress.  Abdominal: Soft. Bowel sounds are normal. She exhibits no distension.  Limited 2/2 voluntary guarding but no focal abd tenderness  Musculoskeletal: She exhibits no edema.  Neurological: She is alert and oriented to person, place, and time.  Fluent speech  Skin: Skin is warm and dry.  Psychiatric:  Distressed, anxious  Nursing note and vitals reviewed.   ED Course  .Critical Care Performed by: Sharlett Iles Authorized by: Sharlett Iles Total critical care time: 35 minutes Critical care time was exclusive of separately billable procedures and treating other patients. Critical care was necessary to treat or prevent imminent or life-threatening deterioration of the following conditions: dehydration and endocrine crisis. Critical care was time spent personally by me on the following activities: development of treatment plan with patient or surrogate, evaluation of patient's response to treatment, examination of patient, obtaining history from patient or surrogate, ordering and performing treatments and interventions, ordering and review of laboratory studies, ordering and review of radiographic studies, re-evaluation of patient's condition and review of old charts.   (including critical care time) Labs Review Labs Reviewed  COMPREHENSIVE  METABOLIC PANEL - Abnormal; Notable for the following:    Chloride 99 (*)    CO2 17 (*)    Glucose, Bld 442 (*)    Creatinine, Ser 1.04 (*)    Total Bilirubin 1.4 (*)    Anion gap 20 (*)    All other components within normal limits  CBC - Abnormal; Notable for the following:    RBC 3.83 (*)    Hemoglobin 10.4 (*)    HCT 32.8 (*)    RDW 15.9 (*)    Platelets 527 (*)    All other components within normal limits  URINALYSIS, ROUTINE W REFLEX MICROSCOPIC (NOT AT [facility]) - Abnormal; Notable for the following:    APPearance CLOUDY (*)    Glucose, UA >1000 (*)    Hgb urine dipstick TRACE (*)    Ketones, ur >80 (*)    Nitrite POSITIVE (*)    Leukocytes, UA TRACE (*)    All other components within normal limits  BLOOD GAS, VENOUS - Abnormal; Notable for the following:    pH, Ven 7.313 (*)    pCO2, Ven 30.0 (*)    pO2, Ven 21.7 (*)    Bicarbonate 14.8 (*)  Acid-base deficit 9.9 (*)    All other components within normal limits  URINE MICROSCOPIC-ADD ON - Abnormal; Notable for the following:    Squamous Epithelial / LPF 0-5 (*)    Bacteria, UA MANY (*)    All other components within normal limits  CBG MONITORING, ED - Abnormal; Notable for the following:    Glucose-Capillary 435 (*)    All other components within normal limits  I-STAT CG4 LACTIC ACID, ED - Abnormal; Notable for the following:    Lactic Acid, Venous 5.64 (*)    All other components within normal limits  CBG MONITORING, ED - Abnormal; Notable for the following:    Glucose-Capillary 454 (*)    All other components within normal limits  CBG MONITORING, ED - Abnormal; Notable for the following:    Glucose-Capillary 378 (*)    All other components within normal limits  LIPASE, BLOOD  CBG MONITORING, ED    Imaging Review No results found. I have personally reviewed and evaluated these lab results as part of my medical decision-making.   EKG Interpretation None     Medications  insulin regular (NOVOLIN  R,HUMULIN R) 250 Units in sodium chloride 0.9 % 250 mL (1 Units/mL) infusion (not administered)  sodium chloride 0.9 % bolus 2,000 mL (2,000 mLs Intravenous New Bag/Given 06/14/15 1514)  LORazepam (ATIVAN) injection 1 mg (1 mg Intravenous Given 06/14/15 1512)  ondansetron (ZOFRAN) injection 4 mg (4 mg Intravenous Given 06/14/15 1513)  promethazine (PHENERGAN) injection 25 mg (25 mg Intravenous Given 06/14/15 1513)    MDM   Final diagnoses:  Diabetic ketoacidosis without coma associated with other specified diabetes mellitus ([place])  Lactic acidosis  Diabetic gastroparesis ([place])   patient with extensive history of gastroparesis secondary to insulin-dependent diabetes who presents with vomiting and abdominal pain that was not relieved by Zofran and Phenergan at her nursing facility. On arrival, the patient was actively retching. Vital signs notable for tachycardia at 120, hypertension. She had no focal abdominal tenderness although exam was limited because of her voluntary guarding and moderate distress on exam. Gave pt zofran, phenergan, later ativan w/ improvement in vomiting. Obtained above labs which were notable for lactate of 5.6, glucose in the 400s, anion gap 20, bicarbonate 17, creatinine 1.04, venous pH 7.31. UA pending at time of admission. Ordered the patient 3 L of IV fluids and initiated insulin drip to treat DKA. Because of the patient's significant lactic acidosis and metabolic derangements, discussed step down admission with hospitalist, Dr. Broadus John, and patient admitted for further care.  Sharlett Iles, MD 06/14/15 [phone]

## 2015-06-14 NOTE — ED Notes (Signed)
Pt has received 633mL NS fluid and 2.73 units insulin via drip prior to losing IV access.

## 2015-06-14 NOTE — ED Notes (Addendum)
Per EMS, pt sent for IV fluids from [facility]. Pt complains of nausea, dry heaves, 10/10 abdominal pain. Pt was given oral Zofran and IM phenergan at [facility], pt refused to have IV started at facility so pt was brought to ED. Pt has hx of DM and gastroparesis. CBG was 416.

## 2015-06-14 NOTE — ED Notes (Signed)
Pt has an ultrasound placed IV that will not support fast administration of IV fluids.  RN must run fluids at 567mL and hour to avoid overwhelming line.

## 2015-06-14 NOTE — H&P (Addendum)
[facility] History and Physical  Veleda [name] [id number] DOB: Nov 11, 1965 DOA: 06/14/2015  Referring physician: EDP PCP: Barbette Merino, MD   Chief Complaint: vomiting  HPI: [name] is a 49 y.o. female with PMH of DM, Severe gastroparesis requiring recurrent hospitalizations now being followed by a gastroenterologist at [facility] with a plan for gastric stimulator placement once insurance issues sorted out. This is her ninth hospitalization in 6 months for same, she was actually discharged to SNF for rehabilitation last week, and was supposed to be discharged from SNF tonight. However has been having nausea vomiting for 2-3 days, she thinks she got her insulin dose last night, she denies any fevers or chills. She reports urinary frequency without dysuria. In the emergency room noted to have diabetic ketoacidosis, with elevated lactic acid and abnormal UA   Review of Systems: Positives bolded Constitutional:  No weight loss, night sweats, Fevers, chills, fatigue.  HEENT:  No headaches, Difficulty swallowing,Tooth/dental problems,Sore throat,  No sneezing, itching, ear ache, nasal congestion, post nasal drip,  Cardio-vascular:  No chest pain, Orthopnea, PND, swelling in lower extremities, anasarca, dizziness, palpitations  GI:  No heartburn, indigestion, abdominal pain, nausea, vomiting, diarrhea, change in bowel habits, loss of appetite  Resp:  No shortness of breath with exertion or at rest. No excess mucus, no productive cough, No non-productive cough, No coughing up of blood.No change in color of mucus.No wheezing.No chest wall deformity  Skin:  no rash or lesions.  GU:  no dysuria, change in color of urine, no urgency or frequency. No flank pain.  Musculoskeletal:  No joint pain or swelling. No decreased range of motion. No back pain.  Psych:  No change in mood or affect. No depression or anxiety. No memory loss.   Past Medical History  Diagnosis Date  .  Diabetes mellitus without complication ([place])   . Gastroparesis    Past Surgical History  Procedure Laterality Date  . Retinal detachment surgery    . Posterior vitrectomy and membrane peel-left eye  09/28/2002  . Posterior vitrectomy and membrane peel-right eye  03/16/2002  . Eye surgery    . Gailstones    . Esophagogastroduodenoscopy  09/27/2014    at [facility], Dr Rolan Lipa. biospy neg for celiac, neg for H pylori.   . Colonoscopy  09/27/2014    at [facility]   Social History:  reports that she has never smoked. She has never used smokeless tobacco. She reports that she does not drink alcohol or use illicit drugs.  Allergies  Allergen Reactions  . Anesthetics, Amide Nausea And Vomiting  . Penicillins Diarrhea and Nausea And Vomiting    Has patient had a PCN reaction causing immediate rash, facial/tongue/throat swelling, SOB or lightheadedness with hypotension: No Has patient had a PCN reaction causing severe rash involving mucus membranes or skin necrosis: No Has patient had a PCN reaction that required hospitalization No Has patient had a PCN reaction occurring within the last 10 years: Yes  If all of the above answers are "NO", then may proceed with Cephalosporin use.   . Buprenorphine Hcl Rash  . Encainide Nausea And Vomiting  . Metoclopramide Rash    Family History  Problem Relation Age of Onset  . Cystic fibrosis Mother   . Hypertension Father   . Diabetes Brother   . Hypertension Maternal Grandmother     Prior to Admission medications   Medication Sig Start Date End Date Taking? Authorizing Provider  ALPRAZolam (XANAX) 0.25 MG tablet Take 1 tablet (0.25 mg  total) by mouth 3 (three) times daily as needed for anxiety. Patient taking differently: Take 0.25 mg by mouth at bedtime as needed for sleep.  05/20/15   Robbie Lis, MD  dronabinol (MARINOL) 10 MG capsule Take 10 mg by mouth 2 (two) times daily. 02/04/15   Historical Provider, MD  FLUoxetine (PROZAC) 40 MG capsule  Take 40 mg by mouth daily.    Historical Provider, MD  insulin aspart (NOVOLOG) 100 UNIT/ML injection Inject 0-15 Units into the skin 3 (three) times daily with meals. 6-15 units three times daily with food.  Per sliding scale Patient taking differently: Inject 0-15 Units into the skin 3 (three) times daily with meals. 2-15 units three times daily with food. Per sliding scale 03/12/15   Belkys A Regalado, MD  insulin detemir (LEVEMIR) 100 UNIT/ML injection Inject 0.1 mLs (10 Units total) into the skin at bedtime. 06/04/15   Velvet Bathe, MD  ondansetron (ZOFRAN ODT) 4 MG disintegrating tablet Take 1 tablet (4 mg total) by mouth every 8 (eight) hours as needed for nausea. 05/20/15   Robbie Lis, MD  pantoprazole (PROTONIX) 40 MG tablet Take 40 mg by mouth daily.  01/04/15   Historical Provider, MD  pregabalin (LYRICA) 150 MG capsule Take 1 capsule (150 mg total) by mouth 2 (two) times daily. 02/17/13   Robbie Lis, MD  promethazine (PHENERGAN) 25 MG suppository Place 1 suppository (25 mg total) rectally every 6 (six) hours as needed for nausea or vomiting. 04/27/15   Samantha Tripp Dowless, PA-C  promethazine (PHENERGAN) 25 MG tablet Take 1 tablet (25 mg total) by mouth every 6 (six) hours as needed for nausea or vomiting. 04/27/15   Carlos Levering, PA-C   Physical Exam: Filed Vitals:   06/14/15 1630 06/14/15 1636 06/14/15 1700 06/14/15 1800  BP: 165/88  163/81 163/91  Pulse: 122  118 122  Temp:  98 F (36.7 C)    TempSrc:  Oral    Resp: 15  34 20  SpO2: 100%  100% 100%    Wt Readings from Last 3 Encounters:  05/28/15 56.8 kg (125 lb 3.5 oz)  05/24/15 60.5 kg (133 lb 6.1 oz)  05/17/15 56 kg (123 lb 7.3 oz)    General:  Ill-appearing, nontoxic, oriented to self place and person Eyes: PERRL, normal lids, irises & conjunctiva ENT: grossly normal hearing, lips & tongue Neck: no LAD, masses or thyromegaly Cardiovascular: Tachycardic , systolic murmur appreciated in No LE  edema. Telemetry: SR, no arrhythmias  Respiratory: CTA bilaterally, no w/r/r. Normal respiratory effort. Abdomen: soft, ntnd, bowel sounds present Skin: no rash or induration seen on limited exam Musculoskeletal: grossly normal tone BUE/BLE Psychiatric: grossly normal mood and affect, speech fluent and appropriate Neurologic: grossly non-focal.          Labs on Admission:  Basic Metabolic Panel:  Recent Labs Lab 06/09/15 1324 06/14/15 1250  NA 133* 136  K 4.3 4.3  CL 95* 99*  CO2 21* 17*  GLUCOSE 302* 442*  BUN 13 13  CREATININE 0.91 1.04*  CALCIUM 9.9 10.0   Liver Function Tests:  Recent Labs Lab 06/09/15 1324 06/14/15 1250  AST 32 33  ALT 19 17  ALKPHOS 100 96  BILITOT 0.9 1.4*  PROT 8.6* 8.0  ALBUMIN 4.5 4.2    Recent Labs Lab 06/09/15 1324 06/14/15 1250  LIPASE 73* 26   No results for input(s): AMMONIA in the last 168 hours. CBC:  Recent Labs Lab 06/09/15 1324 06/14/15  1250  WBC 5.5 8.8  HGB 11.3* 10.4*  HCT 34.8* 32.8*  MCV 85.3 85.6  PLT 493* 527*   Cardiac Enzymes: No results for input(s): CKTOTAL, CKMB, CKMBINDEX, TROPONINI in the last 168 hours.  BNP (last 3 results) No results for input(s): BNP in the last 8760 hours.  ProBNP (last 3 results) No results for input(s): PROBNP in the last 8760 hours.  CBG:  Recent Labs Lab 06/09/15 1537 06/09/15 1822 06/14/15 1341 06/14/15 1532 06/14/15 1733  GLUCAP 356* 228* 435* 454* 378*    Radiological Exams on Admission: No results found.  EKG: Independently reviewed, NSR, non specific c Ihanges  Assessment/Plan Active Problems:   Diabetic ketoacidosis without coma associated with type 1 diabetes mellitus ([place]) -Admit to stepdown, Glucomander protocol -IV fluids -Treat UTI,  -Check B met every 4, mag level -Very brittle diabetic -Diabetes coordinator consult -last hbaic was 7.4  Severe gastroparesis -Apparently allergic to Reglan -Start erythromycin when diet resumed,  supportive care, IV fluids, Phenergan -Followed by motility specialist at [facility], being evaluated for gastric pacemaker    Uncontrolled type 2 diabetes mellitus with diabetic neuropathy, with long-term current use of insulin ([place]) -See above  Suspected UTI with chronic hydronephrosis and chronically distended bladder -IV ceftriaxone, follow up urine cultures, -Follow-up with urology    CKD (chronic kidney disease) stage 3, GFR 30-59 ml/min -improved from baseline    Anemia of chronic kidney failure Stable  DVT proph: lovenox  Code Status:Full Code Family communication: None at bedside discuss plan patient Disposition Plan: inpatient  Time spent:12mn  [facility] Pager [phone]

## 2015-06-14 NOTE — ED Notes (Signed)
Pt lost IV access as it infiltrated.  MD paged to inform.

## 2015-06-15 DIAGNOSIS — N390 Urinary tract infection, site not specified: Secondary | ICD-10-CM

## 2015-06-15 DIAGNOSIS — E1010 Type 1 diabetes mellitus with ketoacidosis without coma: Secondary | ICD-10-CM

## 2015-06-15 DIAGNOSIS — D631 Anemia in chronic kidney disease: Secondary | ICD-10-CM

## 2015-06-15 DIAGNOSIS — N183 Chronic kidney disease, stage 3 (moderate): Secondary | ICD-10-CM

## 2015-06-15 DIAGNOSIS — N189 Chronic kidney disease, unspecified: Secondary | ICD-10-CM

## 2015-06-15 LAB — COMPREHENSIVE METABOLIC PANEL
ALT: 13 U/L — AB (ref 14–54)
ANION GAP: 15 (ref 5–15)
AST: 22 U/L (ref 15–41)
Albumin: 3.4 g/dL — ABNORMAL LOW (ref 3.5–5.0)
Alkaline Phosphatase: 74 U/L (ref 38–126)
BUN: 12 mg/dL (ref 6–20)
CHLORIDE: 111 mmol/L (ref 101–111)
CO2: 16 mmol/L — ABNORMAL LOW (ref 22–32)
CREATININE: 0.92 mg/dL (ref 0.44–1.00)
Calcium: 9 mg/dL (ref 8.9–10.3)
Glucose, Bld: 175 mg/dL — ABNORMAL HIGH (ref 65–99)
Potassium: 3.3 mmol/L — ABNORMAL LOW (ref 3.5–5.1)
Sodium: 142 mmol/L (ref 135–145)
TOTAL PROTEIN: 6.8 g/dL (ref 6.5–8.1)
Total Bilirubin: 0.9 mg/dL (ref 0.3–1.2)

## 2015-06-15 LAB — GLUCOSE, CAPILLARY
GLUCOSE-CAPILLARY: 156 mg/dL — AB (ref 65–99)
GLUCOSE-CAPILLARY: 148 mg/dL — AB (ref 65–99)
GLUCOSE-CAPILLARY: 132 mg/dL — AB (ref 65–99)
GLUCOSE-CAPILLARY: 129 mg/dL — AB (ref 65–99)
GLUCOSE-CAPILLARY: 159 mg/dL — AB (ref 65–99)
GLUCOSE-CAPILLARY: 152 mg/dL — AB (ref 65–99)
GLUCOSE-CAPILLARY: 125 mg/dL — AB (ref 65–99)
GLUCOSE-CAPILLARY: 160 mg/dL — AB (ref 65–99)
Glucose-Capillary: 110 mg/dL — ABNORMAL HIGH (ref 65–99)
Glucose-Capillary: 125 mg/dL — ABNORMAL HIGH (ref 65–99)
Glucose-Capillary: 125 mg/dL — ABNORMAL HIGH (ref 65–99)
Glucose-Capillary: 128 mg/dL — ABNORMAL HIGH (ref 65–99)
Glucose-Capillary: 130 mg/dL — ABNORMAL HIGH (ref 65–99)
Glucose-Capillary: 130 mg/dL — ABNORMAL HIGH (ref 65–99)
Glucose-Capillary: 144 mg/dL — ABNORMAL HIGH (ref 65–99)
Glucose-Capillary: 150 mg/dL — ABNORMAL HIGH (ref 65–99)
Glucose-Capillary: 152 mg/dL — ABNORMAL HIGH (ref 65–99)
Glucose-Capillary: 155 mg/dL — ABNORMAL HIGH (ref 65–99)
Glucose-Capillary: 165 mg/dL — ABNORMAL HIGH (ref 65–99)
Glucose-Capillary: 165 mg/dL — ABNORMAL HIGH (ref 65–99)
Glucose-Capillary: 166 mg/dL — ABNORMAL HIGH (ref 65–99)

## 2015-06-15 LAB — BASIC METABOLIC PANEL
Anion gap: 14 (ref 5–15)
Anion gap: 12 (ref 5–15)
Anion gap: 16 — ABNORMAL HIGH (ref 5–15)
Anion gap: 11 (ref 5–15)
BUN: 10 mg/dL (ref 6–20)
BUN: 8 mg/dL (ref 6–20)
BUN: 6 mg/dL (ref 6–20)
BUN: 6 mg/dL (ref 6–20)
CALCIUM: 9.2 mg/dL (ref 8.9–10.3)
CHLORIDE: 110 mmol/L (ref 101–111)
CHLORIDE: 111 mmol/L (ref 101–111)
CHLORIDE: 110 mmol/L (ref 101–111)
CO2: 18 mmol/L — AB (ref 22–32)
CO2: 18 mmol/L — AB (ref 22–32)
CO2: 15 mmol/L — AB (ref 22–32)
CO2: 19 mmol/L — ABNORMAL LOW (ref 22–32)
CREATININE: 0.86 mg/dL (ref 0.44–1.00)
CREATININE: 0.83 mg/dL (ref 0.44–1.00)
CREATININE: 0.71 mg/dL (ref 0.44–1.00)
Calcium: 8.7 mg/dL — ABNORMAL LOW (ref 8.9–10.3)
Calcium: 8.9 mg/dL (ref 8.9–10.3)
Calcium: 8.9 mg/dL (ref 8.9–10.3)
Chloride: 108 mmol/L (ref 101–111)
Creatinine, Ser: 0.79 mg/dL (ref 0.44–1.00)
GFR calc Af Amer: >60 mL/min (ref >60)
GFR calc Af Amer: >60 mL/min (ref >60)
GFR calc Af Amer: >60 mL/min (ref >60)
GFR calc non Af Amer: >60 mL/min (ref >60)
GFR calc non Af Amer: >60 mL/min (ref >60)
GFR calc non Af Amer: >60 mL/min (ref >60)
GFR calc non Af Amer: >60 mL/min (ref >60)
GLUCOSE: 137 mg/dL — AB (ref 65–99)
Glucose, Bld: 144 mg/dL — ABNORMAL HIGH (ref 65–99)
Glucose, Bld: 167 mg/dL — ABNORMAL HIGH (ref 65–99)
Glucose, Bld: 163 mg/dL — ABNORMAL HIGH (ref 65–99)
Potassium: 3.1 mmol/L — ABNORMAL LOW (ref 3.5–5.1)
Potassium: 3.1 mmol/L — ABNORMAL LOW (ref 3.5–5.1)
Potassium: 4.4 mmol/L (ref 3.5–5.1)
Potassium: 3.4 mmol/L — ABNORMAL LOW (ref 3.5–5.1)
Sodium: 142 mmol/L (ref 135–145)
Sodium: 141 mmol/L (ref 135–145)
Sodium: 141 mmol/L (ref 135–145)
Sodium: 138 mmol/L (ref 135–145)

## 2015-06-15 LAB — CBC
HCT: 27 % — ABNORMAL LOW (ref 36.0–46.0)
Hemoglobin: 8.8 g/dL — ABNORMAL LOW (ref 12.0–15.0)
MCH: 27.5 pg (ref 26.0–34.0)
MCHC: 32.6 g/dL (ref 30.0–36.0)
MCV: 84.4 fL (ref 78.0–100.0)
PLATELETS: 490 10*3/uL — AB (ref 150–400)
RBC: 3.2 MIL/uL — AB (ref 3.87–5.11)
RDW: 16 % — ABNORMAL HIGH (ref 11.5–15.5)
WBC: 11.1 10*3/uL — ABNORMAL HIGH (ref 4.0–10.5)

## 2015-06-15 LAB — BLOOD GAS, ARTERIAL
Acid-base deficit: 5.5 mmol/L — ABNORMAL HIGH (ref 0.0–2.0)
Bicarbonate: 16.8 mEq/L — ABNORMAL LOW (ref 20.0–24.0)
Drawn by: 441261
FIO2: 0.21
O2 SAT: 98.5 %
PATIENT TEMPERATURE: 98.6
TCO2: 15.8 mmol/L (ref 0–100)
pCO2 arterial: 22.9 mmHg — ABNORMAL LOW (ref 35.0–45.0)
pH, Arterial: 7.479 — ABNORMAL HIGH (ref 7.350–7.450)
pO2, Arterial: 115 mmHg — ABNORMAL HIGH (ref 80.0–100.0)

## 2015-06-15 MED ORDER — POTASSIUM CHLORIDE 10 MEQ/100ML IV SOLN
10.0000 meq | INTRAVENOUS | Status: AC
  Administered 2015-06-15 (×4): 10 meq via INTRAVENOUS
  Filled 2015-06-15: qty 100

## 2015-06-15 MED ORDER — INSULIN DETEMIR 100 UNIT/ML ~~LOC~~ SOLN
10.0000 [IU] | Freq: Every day | SUBCUTANEOUS | Status: DC
  Administered 2015-06-15: 10 [IU] via SUBCUTANEOUS
  Filled 2015-06-15 (×2): qty 0.1

## 2015-06-15 MED ORDER — SODIUM CHLORIDE 0.9 % IV BOLUS (SEPSIS)
1000.0000 mL | Freq: Once | INTRAVENOUS | Status: AC
  Administered 2015-06-15: 1000 mL via INTRAVENOUS

## 2015-06-15 MED ORDER — PROMETHAZINE HCL 25 MG/ML IJ SOLN
12.5000 mg | Freq: Four times a day (QID) | INTRAMUSCULAR | Status: DC | PRN
  Administered 2015-06-15 – 2015-06-17 (×5): 12.5 mg via INTRAVENOUS
  Filled 2015-06-15 (×5): qty 1

## 2015-06-15 MED ORDER — LORAZEPAM 2 MG/ML IJ SOLN
0.5000 mg | Freq: Once | INTRAMUSCULAR | Status: AC
  Administered 2015-06-15: 0.5 mg via INTRAVENOUS
  Filled 2015-06-15: qty 1

## 2015-06-15 MED ORDER — HYDRALAZINE HCL 20 MG/ML IJ SOLN
10.0000 mg | Freq: Four times a day (QID) | INTRAMUSCULAR | Status: DC | PRN
  Administered 2015-06-15 – 2015-06-18 (×5): 10 mg via INTRAVENOUS
  Filled 2015-06-15 (×5): qty 1

## 2015-06-15 MED ORDER — ENOXAPARIN SODIUM 40 MG/0.4ML ~~LOC~~ SOLN
40.0000 mg | SUBCUTANEOUS | Status: DC
  Administered 2015-06-15 – 2015-06-19 (×5): 40 mg via SUBCUTANEOUS
  Filled 2015-06-15 (×6): qty 0.4

## 2015-06-15 NOTE — Progress Notes (Signed)
[facility] PROGRESS NOTE  Mashawn Gwathney [id number] DOB: 21-Feb-1966 DOA: 06/14/2015 PCP: Barbette Merino, MD  Assessment/Plan: Active Problems:   Diabetic ketoacidosis without coma associated with type 1 diabetes mellitus ([place]) - On insulin gtt, will transition to SQ insulin once acidosis resolves. - continue BMP checks     CKD (chronic kidney disease) stage 3, GFR 30-59 ml/min - S creatinine stable     Anemia of chronic kidney failure - Stable currently - will continue to monitor  UTI - Placed on Rocephin  Gastroparesis - will have nursing advance diet once nausea subsides and patient is switched to SQ insulin  Code Status: full Family Communication: None reported Disposition Plan: stepdown given continued acidosis   Consultants:  None  Procedures:  None  Antibiotics:  Rocephin  HPI/Subjective: Pt has no new complaints.  Objective: Filed Vitals:   06/15/15 0930 06/15/15 1000  BP:  182/81  Pulse: 110 111  Temp:    Resp:  17    Intake/Output Summary (Last 24 hours) at 06/15/15 1051 Last data filed at 06/15/15 0500  Gross per 24 hour  Intake 1688.95 ml  Output    475 ml  Net 1213.95 ml   Filed Weights   06/15/15 0739  Weight: 59 kg (130 lb 1.1 oz)    Exam:   General:  Pt in nad, alert and awake  Cardiovascular: rrr, no mrg  Respiratory: cta bl, no wheezes  Abdomen: soft, no guarding, ND  Musculoskeletal: no cyanosis or clubbing   Data Reviewed: Basic Metabolic Panel:  Recent Labs Lab 06/14/15 1900 06/14/15 2215 06/15/15 0230 06/15/15 0630 06/15/15 1015  NA 140 143 142 142 141  K 4.1 3.7 3.3* 3.1* 3.1*  CL 109 112* 111 110 111  CO2 15* 21* 16* 18* 18*  GLUCOSE 327* 178* 175* 137* 144*  BUN 14 13 12 10 8   CREATININE 1.11* 1.00 0.92 0.86 0.83  CALCIUM 9.5 9.0 9.0 9.2 8.7*  MG 1.9  --   --   --   --    Liver Function Tests:  Recent Labs Lab 06/09/15 1324 06/14/15 1250 06/15/15 0230  AST 32 33 22  ALT 19 17 13*   ALKPHOS 100 96 74  BILITOT 0.9 1.4* 0.9  PROT 8.6* 8.0 6.8  ALBUMIN 4.5 4.2 3.4*    Recent Labs Lab 06/09/15 1324 06/14/15 1250  LIPASE 73* 26   No results for input(s): AMMONIA in the last 168 hours. CBC:  Recent Labs Lab 06/09/15 1324 06/14/15 1250 06/15/15 0230  WBC 5.5 8.8 11.1*  HGB 11.3* 10.4* 8.8*  HCT 34.8* 32.8* 27.0*  MCV 85.3 85.6 84.4  PLT 493* 527* 490*   Cardiac Enzymes: No results for input(s): CKTOTAL, CKMB, CKMBINDEX, TROPONINI in the last 168 hours. BNP (last 3 results) No results for input(s): BNP in the last 8760 hours.  ProBNP (last 3 results) No results for input(s): PROBNP in the last 8760 hours.  CBG:  Recent Labs Lab 06/15/15 0417 06/15/15 0518 06/15/15 0611 06/15/15 0722 06/15/15 0827  GLUCAP 148* 130* 128* 130* 150*    Recent Results (from the past 240 hour(s))  MRSA PCR Screening     Status: None   Collection Time: 06/14/15  7:16 PM  Result Value Ref Range Status   MRSA by PCR NEGATIVE NEGATIVE Final    Comment:        The GeneXpert MRSA Assay (FDA approved for NASAL specimens only), is one component of a comprehensive MRSA colonization surveillance program. It is  not intended to diagnose MRSA infection nor to guide or monitor treatment for MRSA infections.      Studies: No results found.  Scheduled Meds: . cefTRIAXone (ROCEPHIN)  IV  1 g Intravenous Q24H  . dronabinol  10 mg Oral BID  . enoxaparin (LOVENOX) injection  30 mg Subcutaneous Q24H  . FLUoxetine  40 mg Oral Daily  . insulin detemir  10 Units Subcutaneous QHS  . insulin regular  0-10 Units Intravenous TID WC  . potassium chloride  10 mEq Intravenous Q1 Hr x 4  . pregabalin  150 mg Oral BID   Continuous Infusions: . sodium chloride    . dextrose 5 % and 0.45% NaCl 75 mL/hr at 06/14/15 2109  . insulin (NOVOLIN-R) infusion Stopped (06/15/15 0945)    Time spent: > 35 minutes    Velvet Bathe  [facility] Pager [phone] If 7PM-7AM, please  contact night-coverage at [email or web address], password [facility] 06/15/2015, 10:51 AM  LOS: 1 day

## 2015-06-15 NOTE — Progress Notes (Signed)
Paged Dr. Wendee Beavers about recent cmet with CO2 at 15 and anion gap at 16. BP also 99/53. Change from this morning.

## 2015-06-15 NOTE — Progress Notes (Addendum)
Inpatient Diabetes Program Recommendations  AACE/ADA: New Consensus Statement on Inpatient Glycemic Control (2015)  Target Ranges:  Prepandial:   less than 140 mg/dL      Peak postprandial:   less than 180 mg/dL (1-2 hours)      Critically ill patients:  140 - 180 mg/dL   Review of Glycemic Control  Diabetes history: DM1 Outpatient Diabetes medications: Levemir 10 units QHS, Novolog 2-15 units tidwc Current orders for Inpatient glycemic control: GlucoStabilizer per DKA order set  Results for LASHAUN, POCH (MRN [id number]) as of 06/15/2015 09:45  Ref. Range 06/15/2015 06:30  Sodium Latest Ref Range: 135-145 mmol/L 142  Potassium Latest Ref Range: 3.5-5.1 mmol/L 3.1 (L)  Chloride Latest Ref Range: 101-111 mmol/L 110  CO2 Latest Ref Range: 22-32 mmol/L 18 (L)  BUN Latest Ref Range: 6-20 mg/dL 10  Creatinine Latest Ref Range: 0.44-1.00 mg/dL 0.86  Calcium Latest Ref Range: 8.9-10.3 mg/dL 9.2  EGFR (Non-African Amer.) Latest Ref Range: >60 mL/min >60  EGFR (African American) Latest Ref Range: >60 mL/min >60  Glucose Latest Ref Range: 65-99 mg/dL 137 (H)  Anion gap Latest Ref Range: 5-15  14   Ready for transition to SQ.  Inpatient Diabetes Program Recommendations:    Give Levemir 8 units 1-2 hours prior to d/cing GlucoStabilizer. Change Levemir to 8 units Q24H. Novolog sensitive Q4H. When po intake increases, tidwc and hs  Continue to follow. Thank you. Lorenda Peck, RD, LDN, CDE Inpatient Diabetes Coordinator [phone]  Addendum: Pt received Levemir 10 units at 0228.  Will likely need small amount of meal coverage insulin when eating > 50% meals. Continue to recommend Novolog sensitive Q4H x 12H after insulin drip discontinued.  RV

## 2015-06-15 NOTE — Care Management Note (Signed)
Case Management Note  Patient Details  Name: [name] MRN: [id number] Date of Birth: Oct 29, 1965  Subjective/Objective:         dka           Action/Plan:Date: June 15, 2015 Chart reviewed for concurrent status and case management needs. Will continue to follow patient for changes and needs: Velva Harman, RN, BSN, [place]   [phone]   Expected Discharge Date:                  Expected Discharge Plan:  [place]  In-House Referral:  Clinical Social Work  Discharge planning Services  CM Consult  Post Acute Care Choice:    Choice offered to:     DME Arranged:    DME Agency:     HH Arranged:    [place] Agency:     Status of Service:  In process, will continue to follow  Medicare Important Message Given:    Date Medicare IM Given:    Medicare IM give by:    Date Additional Medicare IM Given:    Additional Medicare Important Message give by:     If discussed at [place] of Stay Meetings, dates discussed:    Additional Comments:  Leeroy Cha, RN 06/15/2015, 9:04 AM

## 2015-06-16 ENCOUNTER — Inpatient Hospital Stay (HOSPITAL_COMMUNITY): Payer: Self-pay

## 2015-06-16 LAB — GLUCOSE, CAPILLARY
GLUCOSE-CAPILLARY: 100 mg/dL — AB (ref 65–99)
GLUCOSE-CAPILLARY: 107 mg/dL — AB (ref 65–99)
GLUCOSE-CAPILLARY: 124 mg/dL — AB (ref 65–99)
GLUCOSE-CAPILLARY: 151 mg/dL — AB (ref 65–99)
GLUCOSE-CAPILLARY: 221 mg/dL — AB (ref 65–99)
GLUCOSE-CAPILLARY: 276 mg/dL — AB (ref 65–99)
Glucose-Capillary: 177 mg/dL — ABNORMAL HIGH (ref 65–99)
Glucose-Capillary: 267 mg/dL — ABNORMAL HIGH (ref 65–99)
Glucose-Capillary: 124 mg/dL — ABNORMAL HIGH (ref 65–99)
Glucose-Capillary: 168 mg/dL — ABNORMAL HIGH (ref 65–99)
Glucose-Capillary: 196 mg/dL — ABNORMAL HIGH (ref 65–99)
Glucose-Capillary: 183 mg/dL — ABNORMAL HIGH (ref 65–99)
Glucose-Capillary: 314 mg/dL — ABNORMAL HIGH (ref 65–99)
Glucose-Capillary: 149 mg/dL — ABNORMAL HIGH (ref 65–99)

## 2015-06-16 LAB — BASIC METABOLIC PANEL
Anion gap: 11 (ref 5–15)
Anion gap: 8 (ref 5–15)
BUN: 7 mg/dL (ref 6–20)
BUN: 7 mg/dL (ref 6–20)
CO2: 18 mmol/L — ABNORMAL LOW (ref 22–32)
CO2: 21 mmol/L — ABNORMAL LOW (ref 22–32)
Calcium: 8.5 mg/dL — ABNORMAL LOW (ref 8.9–10.3)
Calcium: 8.3 mg/dL — ABNORMAL LOW (ref 8.9–10.3)
Chloride: 109 mmol/L (ref 101–111)
Chloride: 106 mmol/L (ref 101–111)
Creatinine, Ser: 0.93 mg/dL (ref 0.44–1.00)
Creatinine, Ser: 0.87 mg/dL (ref 0.44–1.00)
GFR calc Af Amer: >60 mL/min (ref >60)
GFR calc Af Amer: >60 mL/min (ref >60)
GFR calc non Af Amer: >60 mL/min (ref >60)
GFR calc non Af Amer: >60 mL/min (ref >60)
Glucose, Bld: 236 mg/dL — ABNORMAL HIGH (ref 65–99)
Glucose, Bld: 120 mg/dL — ABNORMAL HIGH (ref 65–99)
Potassium: 3.2 mmol/L — ABNORMAL LOW (ref 3.5–5.1)
Potassium: 3.6 mmol/L (ref 3.5–5.1)
Sodium: 138 mmol/L (ref 135–145)
Sodium: 135 mmol/L (ref 135–145)

## 2015-06-16 MED ORDER — INSULIN DETEMIR 100 UNIT/ML ~~LOC~~ SOLN
10.0000 [IU] | Freq: Every day | SUBCUTANEOUS | Status: DC
Start: 2015-06-16 — End: 2015-06-20
  Administered 2015-06-16 – 2015-06-20 (×5): 10 [IU] via SUBCUTANEOUS
  Filled 2015-06-16 (×5): qty 0.1

## 2015-06-16 MED ORDER — MORPHINE SULFATE (PF) 2 MG/ML IV SOLN
2.0000 mg | Freq: Once | INTRAVENOUS | Status: AC
  Administered 2015-06-16: 2 mg via INTRAVENOUS
  Filled 2015-06-16: qty 1

## 2015-06-16 MED ORDER — SODIUM CHLORIDE 0.9 % IV BOLUS (SEPSIS)
500.0000 mL | Freq: Once | INTRAVENOUS | Status: AC
  Administered 2015-06-16: 500 mL via INTRAVENOUS

## 2015-06-16 MED ORDER — INSULIN ASPART 100 UNIT/ML ~~LOC~~ SOLN
0.0000 [IU] | Freq: Three times a day (TID) | SUBCUTANEOUS | Status: DC
  Administered 2015-06-16: 1 [IU] via SUBCUTANEOUS
  Administered 2015-06-16: 7 [IU] via SUBCUTANEOUS
  Administered 2015-06-17: 5 [IU] via SUBCUTANEOUS
  Administered 2015-06-17: 2 [IU] via SUBCUTANEOUS
  Administered 2015-06-17: 3 [IU] via SUBCUTANEOUS
  Administered 2015-06-18: 9 [IU] via SUBCUTANEOUS
  Administered 2015-06-18: 2 [IU] via SUBCUTANEOUS
  Administered 2015-06-19 (×2): 1 [IU] via SUBCUTANEOUS
  Administered 2015-06-19: 2 [IU] via SUBCUTANEOUS
  Administered 2015-06-20: 7 [IU] via SUBCUTANEOUS
  Administered 2015-06-20: 5 [IU] via SUBCUTANEOUS

## 2015-06-16 MED ORDER — ONDANSETRON HCL 4 MG/2ML IJ SOLN
4.0000 mg | Freq: Four times a day (QID) | INTRAMUSCULAR | Status: DC | PRN
  Administered 2015-06-16 – 2015-06-17 (×4): 4 mg via INTRAVENOUS
  Filled 2015-06-16 (×4): qty 2

## 2015-06-16 MED ORDER — INSULIN ASPART 100 UNIT/ML ~~LOC~~ SOLN: 0.0000 [IU] | Freq: Every day | SUBCUTANEOUS | Status: DC

## 2015-06-16 MED ORDER — DRONABINOL 5 MG PO CAPS
10.0000 mg | ORAL_CAPSULE | Freq: Two times a day (BID) | ORAL | Status: DC
  Administered 2015-06-17 – 2015-06-20 (×6): 10 mg via ORAL
  Filled 2015-06-16: qty 2
  Filled 2015-06-16 (×5): qty 4
  Filled 2015-06-16: qty 2
  Filled 2015-06-16: qty 4

## 2015-06-16 NOTE — Evaluation (Signed)
Physical Therapy Evaluation Patient Details Name: [name] MRN: [id number] DOB: 10/27/1965 Today's Date: 06/16/2015   History of Present Illness  49 yo female admitted with DKA. Hx of gastroparesis, DM  Clinical Impression  On eval, pt required Min assist for mobility-walked ~115' with RW. Pt tolerated activity fairly well. Continues to report nausea. Discussed d/c plan-pt states she plans to return to SNF to finish rehab. Recommend return to SNF, if this is an option. If SNF not an option and pt returns home, recommend HHPT/ home health aide/24 hour supervision.    Follow Up Recommendations SNF vs Home health PT;Supervision/Assistance - 24 hour (depending on progress. Pt states she plans to return to SNF to complete rehab.)    Equipment Recommendations  Rolling walker with 5" wheels    Recommendations for Other Services       Precautions / Restrictions Precautions Precautions: Fall Restrictions Weight Bearing Restrictions: No      Mobility  Bed Mobility Overal bed mobility: Needs Assistance Bed Mobility: Supine to Sit     Supine to sit: Min guard     General bed mobility comments: close guard for safety. Increased time.   Transfers Overall transfer level: Needs assistance Equipment used: Rolling walker (2 wheeled) Transfers: Sit to/from Stand Sit to Stand: Min assist         General transfer comment: assist to rise, stabilize, control descent.   Ambulation/Gait Ambulation/Gait assistance: Min assist Ambulation Distance (Feet): 115 Feet Assistive device: Rolling walker (2 wheeled) Gait Pattern/deviations: Step-through pattern;Decreased stride length     General Gait Details: slow gait speed. Assist to stabilize throughout ambulation.   Stairs            Wheelchair Mobility    Modified Rankin (Stroke Patients Only)       Balance Overall balance assessment: Needs assistance         Standing balance support: During functional  activity Standing balance-Leahy Scale: Fair                               Pertinent Vitals/Pain Pain Assessment: 0-10 Faces Pain Scale: Hurts even more Pain Location: abdomen Pain Descriptors / Indicators: Sore Pain Intervention(s): Repositioned    Home Living Family/patient expects to be discharged to:: Unsure (admitted from SNF)                      Prior Function           Comments: per pt, working with PT and walking without assistive device     Hand Dominance        Extremity/Trunk Assessment   Upper Extremity Assessment: Generalized weakness           Lower Extremity Assessment: Generalized weakness      Cervical / Trunk Assessment: Normal  Communication   Communication: No difficulties  Cognition Arousal/Alertness: Awake/alert (drowsy) Behavior During Therapy: WFL for tasks assessed/performed Overall Cognitive Status: Within Functional Limits for tasks assessed                      General Comments      Exercises        Assessment/Plan    PT Assessment Patient needs continued PT services  PT Diagnosis Difficulty walking;Generalized weakness;Acute pain   PT Problem List Decreased strength;Decreased activity tolerance;Decreased balance;Decreased mobility;Pain;Decreased knowledge of use of DME  PT Treatment Interventions Gait training;DME instruction;Functional mobility training;Therapeutic activities;Patient/family education;Balance  training;Therapeutic exercise   PT Goals (Current goals can be found in the Care Plan section) Acute Rehab PT Goals Patient Stated Goal: to not be a burden on my son PT Goal Formulation: With patient Time For Goal Achievement: 06/30/15 Potential to Achieve Goals: Fair    Frequency Min 3X/week   Barriers to discharge        Co-evaluation               End of Session Equipment Utilized During Treatment: Gait belt Activity Tolerance: Patient limited by fatigue Patient  left: in bed;with call bell/phone within reach;with bed alarm set           Time: [id number] PT Time Calculation (min) (ACUTE ONLY): 24 min   Charges:   PT Evaluation $Initial PT Evaluation Tier I: 1 Procedure PT Treatments $Gait Training: 8-22 mins   PT G Codes:        [name], MPT Pager: [phone]

## 2015-06-16 NOTE — Progress Notes (Signed)
   06/16/15 2016  Vitals  BP (!) 80/32 mmHg  MAP (mmHg) 45   Notified T. Rogue Bussing NP of B/P, new orders noted.

## 2015-06-16 NOTE — Clinical Social Work Note (Signed)
Clinical Social Work Assessment  Patient Details  Name: [name] MRN: [id number] Date of Birth: [birth year]  Date of referral:  06/16/15               Reason for consult:  Facility Placement, Discharge Planning                Permission sought to share information with:    Permission granted to share information::  Yes, Verbal Permission Granted  Name::        Agency::     Relationship::     Contact Information:     Housing/Transportation Living arrangements for the past 2 months:  [place], [place] of Information:  Patient, Facility Patient Interpreter Needed:  None Criminal Activity/Legal Involvement Pertinent to Current Situation/Hospitalization:  No - Comment as needed Significant Relationships:  Adult Children, Siblings Lives with:  Facility Resident Do you feel safe going back to the place where you live?  Yes Need for family participation in patient care:  Yes (Comment)  Care giving concerns:  No family at bedside.    Social Worker assessment / plan:  Pt hospitalized on 06/14/15 wit diabetic ketoacidosis from [facility]. CSW met briefly with pt to assist with d/c planning. Pt complained of increased pain and appeared very sleepy in between complaints. Pt gave CSW permission to contact SNF . [facility] reports that pt was at their facility for [facility] and was planning to d/c home just prior to hospitalization. SNF reports that pt no longer has [facility] insurance and they would not be able to readmit pt if continued rehab was needed at d/c. It's not clear at this point if rehab will be needed at d/c. PT eval requested when medically appropriate. CSW will continue to follow to assist with d/c planning needs.  Employment status:  [employer] information:  Self Pay (Medicaid Pending) PT Recommendations:  Not assessed at this time Information / Referral to community resources:     Patient/Family's Response to care:  No family at  bedside. Disposition not yet determined.  Patient/Family's Understanding of and Emotional Response to Diagnosis, Current Treatment, and Prognosis:  Pt was too uncomfortable/ sleepy to have a lengthy conversation with CSW. Ongoing support and assistance with d/c planning will be provided.  Emotional Assessment Appearance:  Appears stated age Attitude/Demeanor/Rapport:  Other (cooperative) Affect (typically observed):  Other (sleepy) Orientation:  Oriented to Self, Oriented to Place, Oriented to  Time, Oriented to Situation Alcohol / Substance use:  Not Applicable Psych involvement (Current and /or in the community):  No (Comment)  Discharge Needs  Concerns to be addressed:  Discharge Planning Concerns Readmission within the last 30 days:  Yes Current discharge risk:  None Barriers to Discharge:  Inadequate or no insurance   Loraine Maple  [phone] 06/16/2015, 1:07 PM

## 2015-06-16 NOTE — Progress Notes (Signed)
   06/16/15 2210  Vitals  BP (!) 82/39 mmHg  MAP (mmHg) 52  Pulse Rate 74  ECG Heart Rate 74  Resp 13  Oxygen Therapy  SpO2 100 %   Notified T. Rogue Bussing NP new orders noted and received

## 2015-06-16 NOTE — Progress Notes (Signed)
[facility] PROGRESS NOTE  [name] [id number] DOB: [birth year] DOA: 06/14/2015 PCP: Barbette Merino, MD  Assessment/Plan: Active Problems:   Diabetic ketoacidosis without coma associated with type 1 diabetes mellitus ([place]) - transitioned to insulin SQ - Reassess BMP     CKD (chronic kidney disease) stage 3, GFR 30-59 ml/min - S creatinine stable     Anemia of chronic kidney failure - Stable currently - will continue to monitor  UTI - Placed on Rocephin  Gastroparesis - will have nursing advance diet once nausea subsides and patient is switched to SQ insulin  Code Status: full Family Communication: None reported Disposition Plan: stepdown, most likely transition to floor once patient is able to be maintained off of glucometer   Consultants:  None  Procedures:  None  Antibiotics:  Rocephin  HPI/Subjective: Pt has no new complaints. No acute issues reported overnight.  Objective: Filed Vitals:   06/16/15 1200 06/16/15 1300  BP: 173/81 170/72  Pulse: 110 110  Temp: 98.2 F (36.8 C)   Resp: 8 16    Intake/Output Summary (Last 24 hours) at 06/16/15 1522 Last data filed at 06/16/15 1300  Gross per 24 hour  Intake 1549.46 ml  Output    175 ml  Net 1374.46 ml   Filed Weights   06/15/15 0739  Weight: 59 kg (130 lb 1.1 oz)    Exam:   General:  Pt in nad, alert and awake  Cardiovascular: rrr, no mrg  Respiratory: cta bl, no wheezes  Abdomen: soft, no guarding, ND  Musculoskeletal: no cyanosis or clubbing   Data Reviewed: Basic Metabolic Panel:  Recent Labs Lab 06/14/15 1900  06/15/15 1015 06/15/15 1415 06/15/15 2152 06/16/15 0210 06/16/15 0642  NA 140  < > 141 141 138 138 135  K 4.1  < > 3.1* 4.4 3.4* 3.2* 3.6  CL 109  < > 111 110 108 109 106  CO2 15*  < > 18* 15* 19* 18* 21*  GLUCOSE 327*  < > 144* 167* 163* 236* 120*  BUN 14  < > 8 6 6 7 7   CREATININE 1.11*  < > 0.83 0.71 0.79 0.93 0.87  CALCIUM 9.5  < > 8.7* 8.9 8.9  8.5* 8.3*  MG 1.9  --   --   --   --   --   --   < > = values in this interval not displayed. Liver Function Tests:  Recent Labs Lab 06/14/15 1250 06/15/15 0230  AST 33 22  ALT 17 13*  ALKPHOS 96 74  BILITOT 1.4* 0.9  PROT 8.0 6.8  ALBUMIN 4.2 3.4*    Recent Labs Lab 06/14/15 1250  LIPASE 26   No results for input(s): AMMONIA in the last 168 hours. CBC:  Recent Labs Lab 06/14/15 1250 06/15/15 0230  WBC 8.8 11.1*  HGB 10.4* 8.8*  HCT 32.8* 27.0*  MCV 85.6 84.4  PLT 527* 490*   Cardiac Enzymes: No results for input(s): CKTOTAL, CKMB, CKMBINDEX, TROPONINI in the last 168 hours. BNP (last 3 results) No results for input(s): BNP in the last 8760 hours.  ProBNP (last 3 results) No results for input(s): PROBNP in the last 8760 hours.  CBG:  Recent Labs Lab 06/16/15 0607 06/16/15 0721 06/16/15 0801 06/16/15 0926 06/16/15 1156  GLUCAP 107* 151* 196* 183* 314*    Recent Results (from the past 240 hour(s))  MRSA PCR Screening     Status: None   Collection Time: 06/14/15  7:16 PM  Result Value Ref  Range Status   MRSA by PCR NEGATIVE NEGATIVE Final    Comment:        The GeneXpert MRSA Assay (FDA approved for NASAL specimens only), is one component of a comprehensive MRSA colonization surveillance program. It is not intended to diagnose MRSA infection nor to guide or monitor treatment for MRSA infections.      Studies: Dg Abd 1 View  06/16/2015  CLINICAL DATA:  Generalized abdominal pain. History of gastroparesis. EXAM: ABDOMEN - 1 VIEW COMPARISON:  05/31/2015 FINDINGS: Supine images of the abdomen and pelvis were obtained. Moderate stool burden throughout the abdomen and pelvis. Small amount of gas in the stomach. Limited evaluation for free air on this supine examination. Nonobstructive bowel gas pattern. No large abdominal calcifications. Cholecystectomy clips in the right abdomen. IMPRESSION: Moderate stool burden in the abdomen and pelvis.  Nonobstructive bowel gas pattern. Electronically Signed   By: Markus Daft M.D.   On: 06/16/2015 13:49    Scheduled Meds: . cefTRIAXone (ROCEPHIN)  IV  1 g Intravenous Q24H  . dronabinol  10 mg Oral BID  . enoxaparin (LOVENOX) injection  40 mg Subcutaneous Q24H  . FLUoxetine  40 mg Oral Daily  . insulin aspart  0-5 Units Subcutaneous QHS  . insulin aspart  0-9 Units Subcutaneous TID WC  . insulin detemir  10 Units Subcutaneous Daily  . pregabalin  150 mg Oral BID   Continuous Infusions: . sodium chloride    . dextrose 5 % and 0.45% NaCl 75 mL/hr at 06/16/15 0315    Time spent: > 35 minutes    Velvet Bathe  [facility] Pager [phone] If 7PM-7AM, please contact night-coverage at [email or web address], password [facility] 06/16/2015, 3:22 PM  LOS: 2 days

## 2015-06-16 NOTE — NC FL2 (Signed)
[place] LEVEL OF CARE SCREENING TOOL     IDENTIFICATION  Patient Name: [name] Birthdate: 07-Apr-1966 Sex: female Admission Date (Current Location): 06/14/2015  [facility] and [place] Number:  Herbalist and Address:         Provider Number: [id number]  Attending Physician Name and Address:  Velvet Bathe, MD  Relative Name and Phone Number:       Current Level of Care: Hospital Recommended Level of Care: [place] Prior Approval Number:    Date Approved/Denied:   PASRR Number: [id number]  Discharge Plan: SNF    Current Diagnoses: Patient Active Problem List   Diagnosis Date Noted  . Diabetic ketoacidosis without coma ([place])   . Lactic acidosis   . Hydronephrosis 06/02/2015  . DKA (diabetic ketoacidoses) ([place]) 05/26/2015  . Anemia 05/26/2015  . Hypomagnesemia 05/26/2015  . DKA, type 1 ([place]) 05/23/2015  . Pressure ulcer 05/19/2015  . Uncontrolled type 2 diabetes mellitus with diabetic neuropathy, with long-term current use of insulin ([place]) 05/18/2015  . CKD (chronic kidney disease) stage 3, GFR 30-59 ml/min 05/18/2015  . Anemia of chronic kidney failure 05/18/2015  . Elevated troponin 05/18/2015  . Hypokalemia 05/18/2015  . Hypotension 05/18/2015  . Anxiety and depression 05/18/2015  . Nausea & vomiting   . Gastroparesis due to DM ([place]) 03/09/2015  . Diabetic ketoacidosis without coma associated with type 1 diabetes mellitus ([place])   . High anion gap metabolic acidosis [id number]    Orientation RESPIRATION BLADDER Height & Weight    Self, Time, Situation, Place    Continent 5\' 2"  (157.5 cm) 130 lbs.  BEHAVIORAL SYMPTOMS/MOOD NEUROLOGICAL BOWEL NUTRITION STATUS  Other (Comment) (No behaviors)   Continent Diet (carb modified)  AMBULATORY STATUS COMMUNICATION OF NEEDS Skin   Limited Assist Verbally Normal                       Personal Care Assistance Level of Assistance  Bathing, Dressing Bathing  Assistance: Limited assistance   Dressing Assistance: Limited assistance     Functional Limitations Info  Sight, Hearing, Speech Sight Info: Adequate Hearing Info: Adequate Speech Info: Adequate    SPECIAL CARE FACTORS FREQUENCY  PT (By licensed PT)     PT Frequency: 5 x wk              Contractures Contractures Info: Not present    Additional Factors Info  Code Status, Psychotropic, Insulin Sliding Scale Code Status Info: Full Code             Current Medications (06/16/2015):  This is the current hospital active medication list Current Facility-Administered Medications  Medication Dose Route Frequency Provider Last Rate Last Dose  . 0.9 %  sodium chloride infusion   Intravenous Continuous Domenic Polite, MD      . acetaminophen (TYLENOL) tablet 650 mg  650 mg Oral Q6H PRN Domenic Polite, MD       Or  . acetaminophen (TYLENOL) suppository 650 mg  650 mg Rectal Q6H PRN Domenic Polite, MD      . ALPRAZolam Duanne Moron) tablet 0.25 mg  0.25 mg Oral TID PRN Domenic Polite, MD   0.25 mg at 06/16/15 1003  . cefTRIAXone (ROCEPHIN) 1 g in dextrose 5 % 50 mL IVPB  1 g Intravenous Q24H Domenic Polite, MD   1 g at 06/15/15 2119  . dextrose 5 %-0.45 % sodium chloride infusion   Intravenous Continuous Rhetta Mura Schorr, NP 75 mL/hr at 06/16/15  0315    . dextrose 50 % solution 25 mL  25 mL Intravenous PRN Domenic Polite, MD      . dronabinol (MARINOL) capsule 10 mg  10 mg Oral BID Domenic Polite, MD   10 mg at 06/16/15 0935  . enoxaparin (LOVENOX) injection 40 mg  40 mg Subcutaneous Q24H Berton Mount, RPH   40 mg at 06/15/15 2124  . FLUoxetine (PROZAC) capsule 40 mg  40 mg Oral Daily Domenic Polite, MD   40 mg at 06/16/15 0935  . hydrALAZINE (APRESOLINE) injection 10 mg  10 mg Intravenous Q6H PRN Velvet Bathe, MD   10 mg at 06/15/15 2213  . insulin aspart (novoLOG) injection 0-5 Units  0-5 Units Subcutaneous QHS Velvet Bathe, MD      . insulin aspart (novoLOG) injection 0-9 Units   0-9 Units Subcutaneous TID WC Velvet Bathe, MD      . insulin detemir (LEVEMIR) injection 10 Units  10 Units Subcutaneous Daily Velvet Bathe, MD   10 Units at 06/16/15 [id number]  . ondansetron (ZOFRAN) injection 4 mg  4 mg Intravenous Q6H PRN Jeryl Columbia, NP   4 mg at 06/16/15 [id number]  . pregabalin (LYRICA) capsule 150 mg  150 mg Oral BID Domenic Polite, MD   150 mg at 06/16/15 0936  . promethazine (PHENERGAN) injection 12.5 mg  12.5 mg Intravenous Q6H PRN Jeryl Columbia, NP   12.5 mg at 06/15/15 2119     Discharge Medications: Please see discharge summary for a list of discharge medications.  Relevant Imaging Results:  Relevant Lab Results:   Additional Information SSN: [id number]  Jadee Golebiewski, Randall An, LCSW

## 2015-06-17 ENCOUNTER — Inpatient Hospital Stay (HOSPITAL_COMMUNITY): Payer: Self-pay

## 2015-06-17 DIAGNOSIS — E1010 Type 1 diabetes mellitus with ketoacidosis without coma: Secondary | ICD-10-CM

## 2015-06-17 LAB — BASIC METABOLIC PANEL
Anion gap: 7 (ref 5–15)
BUN: 7 mg/dL (ref 6–20)
CALCIUM: 8 mg/dL — AB (ref 8.9–10.3)
CO2: 20 mmol/L — AB (ref 22–32)
Chloride: 112 mmol/L — ABNORMAL HIGH (ref 101–111)
Creatinine, Ser: 0.87 mg/dL (ref 0.44–1.00)
GFR calc Af Amer: >60 mL/min (ref >60)
GLUCOSE: 138 mg/dL — AB (ref 65–99)
Potassium: 3.3 mmol/L — ABNORMAL LOW (ref 3.5–5.1)
Sodium: 139 mmol/L (ref 135–145)

## 2015-06-17 LAB — GLUCOSE, CAPILLARY
GLUCOSE-CAPILLARY: 226 mg/dL — AB (ref 65–99)
GLUCOSE-CAPILLARY: 155 mg/dL — AB (ref 65–99)
Glucose-Capillary: 296 mg/dL — ABNORMAL HIGH (ref 65–99)
Glucose-Capillary: 119 mg/dL — ABNORMAL HIGH (ref 65–99)

## 2015-06-17 MED ORDER — ONDANSETRON 4 MG PO TBDP
4.0000 mg | ORAL_TABLET | Freq: Three times a day (TID) | ORAL | Status: DC | PRN
  Filled 2015-06-17: qty 1

## 2015-06-17 MED ORDER — ACETAMINOPHEN 650 MG RE SUPP
650.0000 mg | RECTAL | Status: DC | PRN
  Administered 2015-06-17: 650 mg via RECTAL
  Filled 2015-06-17: qty 1

## 2015-06-17 MED ORDER — ONDANSETRON HCL 4 MG/2ML IJ SOLN
4.0000 mg | Freq: Four times a day (QID) | INTRAMUSCULAR | Status: DC | PRN
  Administered 2015-06-17 – 2015-06-19 (×4): 4 mg via INTRAVENOUS
  Filled 2015-06-17 (×4): qty 2

## 2015-06-17 MED ORDER — ACETAMINOPHEN 10 MG/ML IV SOLN
1000.0000 mg | Freq: Four times a day (QID) | INTRAVENOUS | Status: DC
  Administered 2015-06-17: 1000 mg via INTRAVENOUS
  Filled 2015-06-17 (×4): qty 100

## 2015-06-17 MED ORDER — PROMETHAZINE HCL 25 MG/ML IJ SOLN
12.5000 mg | Freq: Four times a day (QID) | INTRAMUSCULAR | Status: DC | PRN
  Administered 2015-06-18 – 2015-06-19 (×3): 12.5 mg via INTRAVENOUS
  Filled 2015-06-17 (×4): qty 1

## 2015-06-17 MED ORDER — PROMETHAZINE HCL 25 MG/ML IJ SOLN: 12.5000 mg | Freq: Once | INTRAMUSCULAR | Status: AC

## 2015-06-17 MED ORDER — ACETAMINOPHEN 10 MG/ML IV SOLN
1000.0000 mg | Freq: Four times a day (QID) | INTRAVENOUS | Status: AC
  Administered 2015-06-17 – 2015-06-18 (×4): 1000 mg via INTRAVENOUS
  Filled 2015-06-17 (×4): qty 100

## 2015-06-17 MED ORDER — PROMETHAZINE HCL 25 MG/ML IJ SOLN
12.5000 mg | Freq: Four times a day (QID) | INTRAMUSCULAR | Status: DC | PRN
  Administered 2015-06-17 (×2): 12.5 mg via INTRAMUSCULAR
  Filled 2015-06-17 (×2): qty 1

## 2015-06-17 MED ORDER — ACETAMINOPHEN 500 MG PO TABS: 1000.0000 mg | ORAL_TABLET | Freq: Four times a day (QID) | ORAL | Status: DC | PRN

## 2015-06-17 NOTE — Progress Notes (Signed)
Patient had no IV access until 2208 and was not tolerating PO medications due to nausea so she did not receive hydralazine to treat her high blood pressure until after a central line was placed and placement was confirmed.

## 2015-06-17 NOTE — Procedures (Signed)
CENTRAL VENOUS CATHETER INSERTION   Indication: DKA, IV access Consent: yes Time out: yes Appropriate position: yes Hand washing: yes Patient Sterilized and Draped: yes Location: Right Internal jugular # of Attempts: 1 Ultrasound Guidance: yes Wire Confirmed with [place]: yes Insertion depth: 20 cm All Ports Draw and flush: yes CXR:   Pneumothorax: no  Line position appropriate: yes Line sutured in place: yes EBL: <5 cc Complications: no Patient Tolerated Procedure Well: yes     Meribeth Mattes, DO., MS [facility]

## 2015-06-17 NOTE — Progress Notes (Signed)
[place] Progress Note Patient Name: [name] DOB: 1965/10/05 MRN: [id number]   Date of Service  06/17/2015  HPI/Events of Note   reviewed portable chest x-ray post right internal jugular central venous catheter placement. Tip is in the right atrium. Reviewed telemetry which shows mild sinus tachycardia but no ectopy.   eICU Interventions   plan for central venous catheter withdrawal in the morning by daytime physician. Okay to use otherwise.      Intervention Category Intermediate Interventions: Diagnostic test evaluation  Tera Partridge 06/17/2015, 10:08 PM

## 2015-06-17 NOTE — Progress Notes (Signed)
[facility] PROGRESS NOTE  [name] [id number] DOB: 1965/09/14 DOA: 06/14/2015 PCP: Barbette Merino, MD  Assessment/Plan: Active Problems:   Diabetic ketoacidosis without coma associated with type 1 diabetes mellitus ([place]) - transitioned to insulin SQ - last blood sugar in BMP 138     CKD (chronic kidney disease) stage 3, GFR 30-59 ml/min - S creatinine stable     Anemia of chronic kidney failure - Stable currently - will continue to monitor  UTI - Placed on Rocephin  Gastroparesis - will have nursing advance diet once nausea subsides and patient is switched to SQ insulin  Code Status: full Family Communication: None reported Disposition Plan: stepdown, barriers: uncontrolled hypertension and abdominal discomfort   Consultants:  None  Procedures:  None  Antibiotics:  Rocephin  HPI/Subjective: Pt has no new complaints. No acute issues reported overnight.  Objective: Filed Vitals:   06/17/15 0800 06/17/15 0939  BP: 152/70 203/188  Pulse: 97   Temp: 98.4 F (36.9 C)   Resp: 18     Intake/Output Summary (Last 24 hours) at 06/17/15 1020 Last data filed at 06/17/15 0900  Gross per 24 hour  Intake   3025 ml  Output    650 ml  Net   2375 ml   Filed Weights   06/15/15 0739  Weight: 59 kg (130 lb 1.1 oz)    Exam:   General:  Pt in nad, alert and awake  Cardiovascular: rrr, no mrg  Respiratory: cta bl, no wheezes  Abdomen: soft, no guarding, ND  Musculoskeletal: no cyanosis or clubbing   Data Reviewed: Basic Metabolic Panel:  Recent Labs Lab 06/14/15 1900  06/15/15 1415 06/15/15 2152 06/16/15 0210 06/16/15 0642 06/17/15 0010  NA 140  < > 141 138 138 135 139  K 4.1  < > 4.4 3.4* 3.2* 3.6 3.3*  CL 109  < > 110 108 109 106 112*  CO2 15*  < > 15* 19* 18* 21* 20*  GLUCOSE 327*  < > 167* 163* 236* 120* 138*  BUN 14  < > 6 6 7 7 7   CREATININE 1.11*  < > 0.71 0.79 0.93 0.87 0.87  CALCIUM 9.5  < > 8.9 8.9 8.5* 8.3* 8.0*  MG 1.9   --   --   --   --   --   --   < > = values in this interval not displayed. Liver Function Tests:  Recent Labs Lab 06/14/15 1250 06/15/15 0230  AST 33 22  ALT 17 13*  ALKPHOS 96 74  BILITOT 1.4* 0.9  PROT 8.0 6.8  ALBUMIN 4.2 3.4*    Recent Labs Lab 06/14/15 1250  LIPASE 26   No results for input(s): AMMONIA in the last 168 hours. CBC:  Recent Labs Lab 06/14/15 1250 06/15/15 0230  WBC 8.8 11.1*  HGB 10.4* 8.8*  HCT 32.8* 27.0*  MCV 85.6 84.4  PLT 527* 490*   Cardiac Enzymes: No results for input(s): CKTOTAL, CKMB, CKMBINDEX, TROPONINI in the last 168 hours. BNP (last 3 results) No results for input(s): BNP in the last 8760 hours.  ProBNP (last 3 results) No results for input(s): PROBNP in the last 8760 hours.  CBG:  Recent Labs Lab 06/16/15 0926 06/16/15 1156 06/16/15 1607 06/16/15 2124 06/17/15 0804  GLUCAP 183* 314* 149* 124* 296*    Recent Results (from the past 240 hour(s))  MRSA PCR Screening     Status: None   Collection Time: 06/14/15  7:16 PM  Result Value Ref Range  Status   MRSA by PCR NEGATIVE NEGATIVE Final    Comment:        The GeneXpert MRSA Assay (FDA approved for NASAL specimens only), is one component of a comprehensive MRSA colonization surveillance program. It is not intended to diagnose MRSA infection nor to guide or monitor treatment for MRSA infections.      Studies: Dg Abd 1 View  06/16/2015  CLINICAL DATA:  Generalized abdominal pain. History of gastroparesis. EXAM: ABDOMEN - 1 VIEW COMPARISON:  05/31/2015 FINDINGS: Supine images of the abdomen and pelvis were obtained. Moderate stool burden throughout the abdomen and pelvis. Small amount of gas in the stomach. Limited evaluation for free air on this supine examination. Nonobstructive bowel gas pattern. No large abdominal calcifications. Cholecystectomy clips in the right abdomen. IMPRESSION: Moderate stool burden in the abdomen and pelvis. Nonobstructive bowel gas  pattern. Electronically Signed   By: Markus Daft M.D.   On: 06/16/2015 13:49    Scheduled Meds: . acetaminophen  1,000 mg Intravenous 4 times per day  . cefTRIAXone (ROCEPHIN)  IV  1 g Intravenous Q24H  . dronabinol  10 mg Oral BID AC  . enoxaparin (LOVENOX) injection  40 mg Subcutaneous Q24H  . FLUoxetine  40 mg Oral Daily  . insulin aspart  0-5 Units Subcutaneous QHS  . insulin aspart  0-9 Units Subcutaneous TID WC  . insulin detemir  10 Units Subcutaneous Daily  . pregabalin  150 mg Oral BID   Continuous Infusions: . sodium chloride      Time spent: > 35 minutes    Velvet Bathe  [facility] Pager [phone] If 7PM-7AM, please contact night-coverage at [email or web address], password [facility] 06/17/2015, 10:20 AM  LOS: 3 days

## 2015-06-17 NOTE — Progress Notes (Signed)
Date: June 17, 2015 Chart reviewed for concurrent status and case management needs. Will continue to follow patient for changes and needs: switching from iv to sub-q insulin Velva Harman, RN, [place], [place]   [phone]

## 2015-06-18 ENCOUNTER — Inpatient Hospital Stay (HOSPITAL_COMMUNITY): Payer: 59

## 2015-06-18 DIAGNOSIS — I10 Essential (primary) hypertension: Secondary | ICD-10-CM

## 2015-06-18 DIAGNOSIS — F419 Anxiety disorder, unspecified: Secondary | ICD-10-CM

## 2015-06-18 LAB — GLUCOSE, CAPILLARY
GLUCOSE-CAPILLARY: 141 mg/dL — AB (ref 65–99)
GLUCOSE-CAPILLARY: 169 mg/dL — AB (ref 65–99)
Glucose-Capillary: 368 mg/dL — ABNORMAL HIGH (ref 65–99)
Glucose-Capillary: 168 mg/dL — ABNORMAL HIGH (ref 65–99)
Glucose-Capillary: 35 mg/dL — CL (ref 65–99)

## 2015-06-18 MED ORDER — LORAZEPAM 2 MG/ML IJ SOLN
1.0000 mg | Freq: Four times a day (QID) | INTRAMUSCULAR | Status: DC | PRN
  Administered 2015-06-18 – 2015-06-19 (×2): 1 mg via INTRAVENOUS
  Filled 2015-06-18 (×3): qty 1

## 2015-06-18 MED ORDER — LORAZEPAM 2 MG/ML IJ SOLN
0.5000 mg | Freq: Once | INTRAMUSCULAR | Status: AC
  Administered 2015-06-18: 0.5 mg via INTRAVENOUS
  Filled 2015-06-18: qty 1

## 2015-06-18 MED ORDER — METOPROLOL TARTRATE 1 MG/ML IV SOLN
2.5000 mg | Freq: Once | INTRAVENOUS | Status: AC | PRN
  Administered 2015-06-18: 2.5 mg via INTRAVENOUS
  Filled 2015-06-18: qty 5

## 2015-06-18 MED ORDER — POTASSIUM CHLORIDE 10 MEQ/100ML IV SOLN
10.0000 meq | INTRAVENOUS | Status: AC
  Administered 2015-06-18 (×3): 10 meq via INTRAVENOUS
  Filled 2015-06-18 (×3): qty 100

## 2015-06-18 NOTE — Progress Notes (Signed)
[facility] PROGRESS NOTE  [name] [id number] DOB: 07/22/1965 DOA: 06/14/2015 PCP: Barbette Merino, MD  Assessment/Plan: Active Problems:   Diabetic ketoacidosis without coma associated with type 1 diabetes mellitus ([place]) - transitioned to insulin SQ - last blood sugar in BMP 138     CKD (chronic kidney disease) stage 3, GFR 30-59 ml/min - S creatinine stable     Anemia of chronic kidney failure - Stable currently - will continue to monitor  UTI - No urine culture available continue Rocephin, currently day 5/7  Gastroparesis - Place order to ambulate patient - avoid opiods, treat pain medication with acetaminophen high dose  Hypertension - Not well controlled. Most likely due to discomfort from antihypertensive regimen - Currently has hydralazine prn - Not well controlled despite hydralazine administration. As such will add one time Lopressor 2.5 mg IV prn elevated BP. May need to continue pending results  Anxiety - Pt on xanax at home but unable to take medication due to nausea - Will place on ativan while unable to take medications orally  Code Status: full Family Communication: None reported Disposition Plan: stepdown, barriers: uncontrolled hypertension and abdominal discomfort   Consultants:  None  Procedures:  None  Antibiotics:  Rocephin  HPI/Subjective: Pt still complaining of nausea.  Has no new concerns. Asks about pain medication but I have indicated that I am unable to provide opiods due to diabetic gastroparesis and Nsaids due to uncontrolled hypertension.  Objective: Filed Vitals:   06/18/15 0524 06/18/15 0600  BP: 158/68 157/77  Pulse: 100 100  Temp:    Resp: 21 20    Intake/Output Summary (Last 24 hours) at 06/18/15 0859 Last data filed at 06/18/15 0600  Gross per 24 hour  Intake    585 ml  Output    525 ml  Net     60 ml   Filed Weights   06/15/15 0739 06/18/15 0356  Weight: 59 kg (130 lb 1.1 oz) 53.3 kg (117 lb 8.1  oz)    Exam:   General:  Pt in nad, alert and awake  Cardiovascular: rrr, no mrg  Respiratory: cta bl, no wheezes  Abdomen: soft, no guarding, ND< hypoactive bowel sounds  Musculoskeletal: no cyanosis or clubbing   Data Reviewed: Basic Metabolic Panel:  Recent Labs Lab 06/14/15 1900  06/15/15 1415 06/15/15 2152 06/16/15 0210 06/16/15 0642 06/17/15 0010  NA 140  < > 141 138 138 135 139  K 4.1  < > 4.4 3.4* 3.2* 3.6 3.3*  CL 109  < > 110 108 109 106 112*  CO2 15*  < > 15* 19* 18* 21* 20*  GLUCOSE 327*  < > 167* 163* 236* 120* 138*  BUN 14  < > 6 6 7 7 7   CREATININE 1.11*  < > 0.71 0.79 0.93 0.87 0.87  CALCIUM 9.5  < > 8.9 8.9 8.5* 8.3* 8.0*  MG 1.9  --   --   --   --   --   --   < > = values in this interval not displayed. Liver Function Tests:  Recent Labs Lab 06/14/15 1250 06/15/15 0230  AST 33 22  ALT 17 13*  ALKPHOS 96 74  BILITOT 1.4* 0.9  PROT 8.0 6.8  ALBUMIN 4.2 3.4*    Recent Labs Lab 06/14/15 1250  LIPASE 26   No results for input(s): AMMONIA in the last 168 hours. CBC:  Recent Labs Lab 06/14/15 1250 06/15/15 0230  WBC 8.8 11.1*  HGB 10.4* 8.8*  HCT 32.8* 27.0*  MCV 85.6 84.4  PLT 527* 490*   Cardiac Enzymes: No results for input(s): CKTOTAL, CKMB, CKMBINDEX, TROPONINI in the last 168 hours. BNP (last 3 results) No results for input(s): BNP in the last 8760 hours.  ProBNP (last 3 results) No results for input(s): PROBNP in the last 8760 hours.  CBG:  Recent Labs Lab 06/16/15 2124 06/17/15 0804 06/17/15 1217 06/17/15 1659 06/17/15 2143  GLUCAP 124* 296* 226* 155* 119*    Recent Results (from the past 240 hour(s))  MRSA PCR Screening     Status: None   Collection Time: 06/14/15  7:16 PM  Result Value Ref Range Status   MRSA by PCR NEGATIVE NEGATIVE Final    Comment:        The GeneXpert MRSA Assay (FDA approved for NASAL specimens only), is one component of a comprehensive MRSA colonization surveillance program.  It is not intended to diagnose MRSA infection nor to guide or monitor treatment for MRSA infections.      Studies: Dg Abd 1 View  06/16/2015  CLINICAL DATA:  Generalized abdominal pain. History of gastroparesis. EXAM: ABDOMEN - 1 VIEW COMPARISON:  05/31/2015 FINDINGS: Supine images of the abdomen and pelvis were obtained. Moderate stool burden throughout the abdomen and pelvis. Small amount of gas in the stomach. Limited evaluation for free air on this supine examination. Nonobstructive bowel gas pattern. No large abdominal calcifications. Cholecystectomy clips in the right abdomen. IMPRESSION: Moderate stool burden in the abdomen and pelvis. Nonobstructive bowel gas pattern. Electronically Signed   By: Markus Daft M.D.   On: 06/16/2015 13:49   Dg Chest Port 1 View  06/17/2015  CLINICAL DATA:  Central line placement EXAM: PORTABLE CHEST 1 VIEW COMPARISON:  May 23, 2015 FINDINGS: The heart size and mediastinal contours are within normal limits. Right jugular central venous line is identified with distal tip in the right atrium. Both lungs are clear. There is no pneumothorax. The visualized skeletal structures are unremarkable. IMPRESSION: No active cardial disease. Right jugular central venous line with distal tip in the right atrium. There is no pneumothorax. Electronically Signed   By: Abelardo Diesel M.D.   On: 06/17/2015 21:15    Scheduled Meds: . acetaminophen  1,000 mg Intravenous 4 times per day  . cefTRIAXone (ROCEPHIN)  IV  1 g Intravenous Q24H  . dronabinol  10 mg Oral BID AC  . enoxaparin (LOVENOX) injection  40 mg Subcutaneous Q24H  . FLUoxetine  40 mg Oral Daily  . insulin aspart  0-5 Units Subcutaneous QHS  . insulin aspart  0-9 Units Subcutaneous TID WC  . insulin detemir  10 Units Subcutaneous Daily  . potassium chloride  10 mEq Intravenous Q1 Hr x 3  . pregabalin  150 mg Oral BID   Continuous Infusions: . sodium chloride 50 mL/hr at 06/17/15 2248    Time spent: >  35 minutes    Velvet Bathe  [facility] Pager [phone] If 7PM-7AM, please contact night-coverage at [email or web address], password [facility] 06/18/2015, 8:59 AM  LOS: 4 days

## 2015-06-19 LAB — GLUCOSE, CAPILLARY
GLUCOSE-CAPILLARY: 175 mg/dL — AB (ref 65–99)
GLUCOSE-CAPILLARY: 144 mg/dL — AB (ref 65–99)
GLUCOSE-CAPILLARY: 192 mg/dL — AB (ref 65–99)
GLUCOSE-CAPILLARY: 123 mg/dL — AB (ref 65–99)
Glucose-Capillary: 68 mg/dL (ref 65–99)
Glucose-Capillary: 109 mg/dL — ABNORMAL HIGH (ref 65–99)

## 2015-06-19 MED ORDER — DOCUSATE SODIUM 100 MG PO CAPS
100.0000 mg | ORAL_CAPSULE | Freq: Two times a day (BID) | ORAL | Status: DC
  Administered 2015-06-19 – 2015-06-20 (×2): 100 mg via ORAL
  Filled 2015-06-19 (×2): qty 1

## 2015-06-19 MED ORDER — ALPRAZOLAM 0.25 MG PO TABS
0.2500 mg | ORAL_TABLET | Freq: Three times a day (TID) | ORAL | Status: DC | PRN
  Administered 2015-06-19 – 2015-06-20 (×2): 0.25 mg via ORAL
  Filled 2015-06-19 (×2): qty 1

## 2015-06-19 MED ORDER — ALPRAZOLAM 0.25 MG PO TABS: 0.2500 mg | ORAL_TABLET | Freq: Once | ORAL | Status: DC

## 2015-06-19 NOTE — Progress Notes (Signed)
Hand off report given to [place], Therapist, sports.  Patient transferring to room 1325 via wheelchair.

## 2015-06-19 NOTE — Progress Notes (Signed)
[facility] PROGRESS NOTE  [name] [id number] DOB: 07-06-1965 DOA: 06/14/2015 PCP: Barbette Merino, MD  Assessment/Plan: Active Problems:   Diabetic ketoacidosis without coma associated with type 1 diabetes mellitus ([place]) - resolved - continue current regimen     CKD (chronic kidney disease) stage 3, GFR 30-59 ml/min - S creatinine stable     Anemia of chronic kidney failure - Stable currently - will continue to monitor  UTI - No urine culture available continue Rocephin, currently day 6/7  Gastroparesis - Ambulate patient, discussed importance of this with patient. - avoid opiods, treat pain medication with acetaminophen high dose  Hypertension - improved control - Currently has hydralazine prn  Anxiety -  Will continue ativan while patient in house. I suspect this has helped patient condition improve, as in her anxiety may be contributing to her abdominal complaints.  Code Status: full Family Communication: None reported Disposition Plan: stepdown, barriers: uncontrolled hypertension and abdominal discomfort   Consultants:  None  Procedures:  None  Antibiotics:  Rocephin  HPI/Subjective: Pt feels improved today  Objective: Filed Vitals:   06/19/15 0431 06/19/15 0600  BP:  122/55  Pulse:  73  Temp: 98.9 F (37.2 C)   Resp:  15    Intake/Output Summary (Last 24 hours) at 06/19/15 0920 Last data filed at 06/19/15 0700  Gross per 24 hour  Intake   1550 ml  Output   1050 ml  Net    500 ml   Filed Weights   06/15/15 0739 06/18/15 0356 06/19/15 0431  Weight: 59 kg (130 lb 1.1 oz) 53.3 kg (117 lb 8.1 oz) 54.6 kg (120 lb 5.9 oz)    Exam:   General:  Pt in nad, alert and awake  Cardiovascular: rrr, no mrg  Respiratory: cta bl, no wheezes  Abdomen: soft, no guarding, ND< hypoactive bowel sounds  Musculoskeletal: no cyanosis or clubbing   Data Reviewed: Basic Metabolic Panel:  Recent Labs Lab 06/14/15 1900  06/15/15 1415  06/15/15 2152 06/16/15 0210 06/16/15 0642 06/17/15 0010  NA 140  < > 141 138 138 135 139  K 4.1  < > 4.4 3.4* 3.2* 3.6 3.3*  CL 109  < > 110 108 109 106 112*  CO2 15*  < > 15* 19* 18* 21* 20*  GLUCOSE 327*  < > 167* 163* 236* 120* 138*  BUN 14  < > 6 6 7 7 7   CREATININE 1.11*  < > 0.71 0.79 0.93 0.87 0.87  CALCIUM 9.5  < > 8.9 8.9 8.5* 8.3* 8.0*  MG 1.9  --   --   --   --   --   --   < > = values in this interval not displayed. Liver Function Tests:  Recent Labs Lab 06/14/15 1250 06/15/15 0230  AST 33 22  ALT 17 13*  ALKPHOS 96 74  BILITOT 1.4* 0.9  PROT 8.0 6.8  ALBUMIN 4.2 3.4*    Recent Labs Lab 06/14/15 1250  LIPASE 26   No results for input(s): AMMONIA in the last 168 hours. CBC:  Recent Labs Lab 06/14/15 1250 06/15/15 0230  WBC 8.8 11.1*  HGB 10.4* 8.8*  HCT 32.8* 27.0*  MCV 85.6 84.4  PLT 527* 490*   Cardiac Enzymes: No results for input(s): CKTOTAL, CKMB, CKMBINDEX, TROPONINI in the last 168 hours. BNP (last 3 results) No results for input(s): BNP in the last 8760 hours.  ProBNP (last 3 results) No results for input(s): PROBNP in the last 8760 hours.  CBG:  Recent Labs Lab 06/18/15 1727 06/18/15 1807 06/18/15 1949 06/18/15 2122 06/19/15 0817  GLUCAP 35* 141* 175* 169* 144*    Recent Results (from the past 240 hour(s))  MRSA PCR Screening     Status: None   Collection Time: 06/14/15  7:16 PM  Result Value Ref Range Status   MRSA by PCR NEGATIVE NEGATIVE Final    Comment:        The GeneXpert MRSA Assay (FDA approved for NASAL specimens only), is one component of a comprehensive MRSA colonization surveillance program. It is not intended to diagnose MRSA infection nor to guide or monitor treatment for MRSA infections.      Studies: Dg Chest Port 1 View  06/18/2015  CLINICAL DATA:  Adjustment of central line. EXAM: PORTABLE CHEST 1 VIEW COMPARISON:  Yesterday. FINDINGS: The right IJ catheter tip is at the cavoatrial junction.  The heart size appears normal. There is no pleural effusion or edema. No airspace consolidation identified. IMPRESSION: 1. No acute cardiopulmonary abnormalities. 2. Status post right IJ catheter retraction. Tip is now at the superior cavoatrial junction. Electronically Signed   By: Kerby Moors M.D.   On: 06/18/2015 14:34   Dg Chest Port 1 View  06/17/2015  CLINICAL DATA:  Central line placement EXAM: PORTABLE CHEST 1 VIEW COMPARISON:  May 23, 2015 FINDINGS: The heart size and mediastinal contours are within normal limits. Right jugular central venous line is identified with distal tip in the right atrium. Both lungs are clear. There is no pneumothorax. The visualized skeletal structures are unremarkable. IMPRESSION: No active cardial disease. Right jugular central venous line with distal tip in the right atrium. There is no pneumothorax. Electronically Signed   By: Abelardo Diesel M.D.   On: 06/17/2015 21:15    Scheduled Meds: . cefTRIAXone (ROCEPHIN)  IV  1 g Intravenous Q24H  . dronabinol  10 mg Oral BID AC  . enoxaparin (LOVENOX) injection  40 mg Subcutaneous Q24H  . FLUoxetine  40 mg Oral Daily  . insulin aspart  0-5 Units Subcutaneous QHS  . insulin aspart  0-9 Units Subcutaneous TID WC  . insulin detemir  10 Units Subcutaneous Daily  . pregabalin  150 mg Oral BID   Continuous Infusions: . sodium chloride 50 mL/hr at 06/19/15 0500    Time spent: > 35 minutes    Velvet Bathe  [facility] Pager [phone] If 7PM-7AM, please contact night-coverage at [email or web address], password [facility] 06/19/2015, 9:20 AM  LOS: 5 days

## 2015-06-20 LAB — GLUCOSE, CAPILLARY
GLUCOSE-CAPILLARY: 322 mg/dL — AB (ref 65–99)
GLUCOSE-CAPILLARY: 145 mg/dL — AB (ref 65–99)
Glucose-Capillary: 230 mg/dL — ABNORMAL HIGH (ref 65–99)

## 2015-06-20 MED ORDER — ONDANSETRON 4 MG PO TBDP: 4.0000 mg | ORAL_TABLET | Freq: Three times a day (TID) | ORAL | Status: DC | PRN

## 2015-06-20 NOTE — Plan of Care (Signed)
Problem: Activity: Goal: Ability to perform activities of daily living will improve Outcome: Progressing Patient ambulated in hallway with staff using rolling walker. Tolerated well. Ambulated from room 1325 to elevator and back to room.

## 2015-06-20 NOTE — Plan of Care (Signed)
Problem: Physical Regulation: Goal: Pain level will decrease Outcome: Progressing Patient denies pain at present

## 2015-06-20 NOTE — Care Management Note (Signed)
Case Management Note  Patient Details  Name: [name] MRN: [id number] Date of Birth: Oct 21, 1965  Subjective/Objective:    Admitted with Diabetic ketoacidosis without coma associated with type 1 diabetes mellitus                 Action/Plan: Discharge planning, spoke with patient at bedside. Had been at [facility] for 2 weeks, was to be d/ced when she was admitted with DKA. Patient is long time diabetic and feels she understands how to manage her diabetes, has glucometer and testing supplies at home. Requesting a cane for ambulation assistance, refused RW. Does not feel she needs any HH at this time, plans to d/c home with her son who will assist with her care. Has PCP and plans to f/u with him after d/c. Has insulin at home and no barriers to obtaining. Listed as self pay in system, patient provided insurance card and states insurance is still active. Information given to admissions.  Expected Discharge Date:                  Expected Discharge Plan:  Home/Self Care  In-House Referral:  Clinical Social Work  Discharge planning Services  CM Consult  Post Acute Care Choice:  Durable Medical Equipment Choice offered to:  NA  DME Arranged:  Kasandra Knudsen DME Agency:  [place]:  NA [place] Agency:  NA  Status of Service:  Completed, signed off  Medicare Important Message Given:    Date Medicare IM Given:    Medicare IM give by:    Date Additional Medicare IM Given:    Additional Medicare Important Message give by:     If discussed at [place] of Stay Meetings, dates discussed:    Additional Comments:  Guadalupe Maple, RN 06/20/2015, 1:33 PM

## 2015-06-20 NOTE — Progress Notes (Signed)
Hypoglycemic Event  CBG: 68 at 2135   Treatment: 15 GM carbohydrate snack  Symptoms: None  Follow-up CBG: Time:2231 CBG Result:109  Possible Reasons for Event: Unknown  Comments/MD notified:given graham cracker and grape juice by NT      [name]

## 2015-06-20 NOTE — Discharge Summary (Signed)
Physician Discharge Summary  [name] [id number] DOB: 12-29-1965 DOA: 06/14/2015  PCP: Barbette Merino, MD  Admit date: 06/14/2015 Discharge date: 06/20/2015  Time spent: > 35  minutes  Recommendations for Outpatient Follow-up:  Monitor blood sugars and adjust hyperglycemic agents accordingly  Patient should seek specialists evaluation for her gastroparesis Also she has not done so already should follow-up with her urologist  Discharge Diagnoses:  Active Problems:   Diabetic ketoacidosis without coma associated with type 1 diabetes mellitus ([place])   Uncontrolled type 2 diabetes mellitus with diabetic neuropathy, with long-term current use of insulin ([facility])   CKD (chronic kidney disease) stage 3, GFR 30-59 ml/min   Anemia of chronic kidney failure   DKA (diabetic ketoacidoses) ([place])   Discharge Condition: stable  Diet recommendation: Carb modified diet  Filed Weights   06/15/15 0739 06/18/15 0356 06/19/15 0431  Weight: 59 kg (130 lb 1.1 oz) 53.3 kg (117 lb 8.1 oz) 54.6 kg (120 lb 5.9 oz)    History of present illness:  49 y.o. female with PMH of DM, Severe gastroparesis requiring recurrent hospitalizations now being followed by a gastroenterologist at [facility] with a plan for gastric stimulator placement once insurance issues sorted out. Who presented secondary to nausea and emesis  Hospital Course:  Active Problems:  Diabetic ketoacidosis without coma associated with type 1 diabetes mellitus ([place]) - resolved - continue current regimen   CKD (chronic kidney disease) stage 3, GFR 30-59 ml/min - S creatinine stable  - Recommded the patient f/u with urologist   Anemia of chronic kidney failure - Stable currently - will continue to monitor  UTI - treated for 6 days with ceftriaxone. Not complaining of any discomfort with urination, no fevers, no polyuria. Will d/c off antibiotics  Gastroparesis - Pt to continue home medication regimen.  Hypertension -  improved control - Currently has hydralazine prn  Anxiety - Pt to continue home regimen  Procedures:  None  Consultations:  None  Discharge Exam: Filed Vitals:   06/19/15 2033 06/20/15 0507  BP: 107/54 144/68  Pulse: 74 97  Temp: 99 F (37.2 C) 97.9 F (36.6 C)  Resp: 16 16    General: Pt in nad, alert and awake Cardiovascular: rrr, no mrg Respiratory: cta bl, no wheezes  Discharge Instructions   Discharge Instructions    Call MD for:  persistant dizziness or light-headedness    Complete by:  As directed      Call MD for:  persistant nausea and vomiting    Complete by:  As directed      Call MD for:  temperature >100.4    Complete by:  As directed      Diet - low sodium heart healthy    Complete by:  As directed      Discharge instructions    Complete by:  As directed   Please follow up with your primary care physician in 1-2 weeks or sooner should any new concerns arise.     Increase activity slowly    Complete by:  As directed           Current Discharge Medication List    CONTINUE these medications which have CHANGED   Details  ondansetron (ZOFRAN ODT) 4 MG disintegrating tablet Take 1 tablet (4 mg total) by mouth every 8 (eight) hours as needed for nausea. Qty: 40 tablet, Refills: 0      CONTINUE these medications which have NOT CHANGED   Details  ALPRAZolam (XANAX) 0.25 MG tablet Take 1  tablet (0.25 mg total) by mouth 3 (three) times daily as needed for anxiety. Qty: 15 tablet, Refills: 0    dronabinol (MARINOL) 10 MG capsule Take 10 mg by mouth 2 (two) times daily.    FLUoxetine (PROZAC) 40 MG capsule Take 40 mg by mouth daily.    insulin aspart (NOVOLOG) 100 UNIT/ML injection Inject 0-15 Units into the skin 3 (three) times daily with meals. 6-15 units three times daily with food.  Per sliding scale Qty: 1 vial, Refills: 12    insulin detemir (LEVEMIR) 100 UNIT/ML injection Inject 0.1 mLs (10 Units total) into the skin at bedtime. Qty: 10  mL, Refills: 3    pantoprazole (PROTONIX) 40 MG tablet Take 40 mg by mouth daily.     pregabalin (LYRICA) 150 MG capsule Take 1 capsule (150 mg total) by mouth 2 (two) times daily. Qty: 60 capsule, Refills: 0    promethazine (PHENERGAN) 25 MG suppository Place 1 suppository (25 mg total) rectally every 6 (six) hours as needed for nausea or vomiting. Qty: 12 each, Refills: 0      STOP taking these medications     promethazine (PHENERGAN) 25 MG tablet        Allergies  Allergen Reactions  . Anesthetics, Amide Nausea And Vomiting  . Penicillins Diarrhea and Nausea And Vomiting    Has patient had a PCN reaction causing immediate rash, facial/tongue/throat swelling, SOB or lightheadedness with hypotension: No Has patient had a PCN reaction causing severe rash involving mucus membranes or skin necrosis: No Has patient had a PCN reaction that required hospitalization No Has patient had a PCN reaction occurring within the last 10 years: Yes  If all of the above answers are "NO", then may proceed with Cephalosporin use.   . Buprenorphine Hcl Rash  . Encainide Nausea And Vomiting  . Metoclopramide Rash      The results of significant diagnostics from this hospitalization (including imaging, microbiology, ancillary and laboratory) are listed below for reference.    Significant Diagnostic Studies: Dg Abd 1 View  06/16/2015  CLINICAL DATA:  Generalized abdominal pain. History of gastroparesis. EXAM: ABDOMEN - 1 VIEW COMPARISON:  05/31/2015 FINDINGS: Supine images of the abdomen and pelvis were obtained. Moderate stool burden throughout the abdomen and pelvis. Small amount of gas in the stomach. Limited evaluation for free air on this supine examination. Nonobstructive bowel gas pattern. No large abdominal calcifications. Cholecystectomy clips in the right abdomen. IMPRESSION: Moderate stool burden in the abdomen and pelvis. Nonobstructive bowel gas pattern. Electronically Signed   By: Markus Daft M.D.   On: 06/16/2015 13:49   Ct Abdomen Pelvis W Contrast  05/31/2015  CLINICAL DATA:  Abdominal pain.  Type 1 diabetes and gastroparesis. EXAM: CT ABDOMEN AND PELVIS WITH CONTRAST TECHNIQUE: Multidetector CT imaging of the abdomen and pelvis was performed using the standard protocol following bolus administration of intravenous contrast. CONTRAST:  175mL OMNIPAQUE IOHEXOL 300 MG/ML  SOLN COMPARISON:  05/14/2015 FINDINGS: Lower chest and abdominal wall:  Anasarca. Chronic lower esophageal thickening, with small sliding hiatal hernia better seen on the previous study. Hepatobiliary: Probable steatosis.Cholecystectomy. Normal common bile duct diameter. Pancreas: Unremarkable. Spleen: Unremarkable. Adrenals/Urinary Tract: Negative adrenals. Moderate bilateral hydroureteronephrosis which is greater than previously seen. Hydronephrosis is secondary to chronic marked over distension of the urinary bladder, extending to the umbilicus. The wall is chronically circumferentially thickened without acute surrounding inflammatory change. Small volume gas in the bladder which is nonspecific. Anticipate drainage and urinalysis. Reproductive:Small enhancing uterine masses,  up to 21 mm, consistent with fibroids. Negative adnexa. Stomach/Bowel: No obstruction. No inflammatory change. No gastric dilatation. Vascular/Lymphatic: No acute vascular abnormality. No mass or adenopathy. Peritoneal: No ascites or pneumoperitoneum. Musculoskeletal: No acute abnormalities. IMPRESSION: 1. Chronically over distended bladder with progressive bilateral hydroureteronephrosis. 2. Chronic findings are stable and described above. Electronically Signed   By: Monte Fantasia M.D.   On: 05/31/2015 15:37   Ir Fluoro Guide Cv Line Right  05/23/2015  CLINICAL DATA:  Diabetic ketoacidosis, needs venous access for IV therapies EXAM: PICC PLACEMENT WITH ULTRASOUND AND FLUOROSCOPY FLUOROSCOPY TIME:  0.1 minute, 11 uGym2 DAP TECHNIQUE: After written  informed consent was obtained, patient was placed in the supine position on angiographic table. Patency of the right basilic vein was confirmed with ultrasound with image documentation. An appropriate skin site was determined. Skin site was marked. Region was prepped using maximum barrier technique including cap and mask, sterile gown, sterile gloves, large sterile sheet, and Chlorhexidine as cutaneous antisepsis. The region was infiltrated locally with 1% lidocaine. Under real-time ultrasound guidance, the right basilic vein was accessed with a 21 gauge micropuncture needle; the needle tip within the vein was confirmed with ultrasound image documentation. Needle exchanged over a 018 guidewire for a peel-away sheath, through which a 5-French double-lumen power injectable PICC trimmed to 38cm was advanced, positioned with its tip near the cavoatrial junction. Spot chest radiograph confirms appropriate catheter position. Catheter was flushed per protocol and secured externally. The patient tolerated procedure well. COMPLICATIONS: COMPLICATIONS none IMPRESSION: 1. Technically successful five [place] double lumen power injectable PICC placement Electronically Signed   By: Lucrezia Europe M.D.   On: 05/23/2015 16:40   Ir US Guide Vasc Access Right  05/27/2015  ADDENDUM REPORT: 05/27/2015 10:51 ADDENDUM: Nesacaine was used as a local anesthetic rather than lidocaine due to an allergy. Electronically Signed   By: Markus Daft M.D.   On: 05/27/2015 10:51  05/27/2015  CLINICAL DATA:  49 year old with gastroparesis, persistent retching and poor venous access. EXAM: POWER PICC LINE PLACEMENT WITH ULTRASOUND AND FLUOROSCOPIC GUIDANCE FLUOROSCOPY TIME:  12 seconds, 2 mGy PROCEDURE: The patient was advised of the possible risks and complications and agreed to undergo the procedure. The patient was then brought to the angiographic suite for the procedure. The right arm was prepped with chlorhexidine, draped in the usual sterile  fashion using maximum barrier technique (cap and mask, sterile gown, sterile gloves, large sterile sheet, hand hygiene and cutaneous antisepsis) and infiltrated locally with 1% Lidocaine. Ultrasound demonstrated patency of the right brachial vein, and this was documented with an image. Under real-time ultrasound guidance, this vein was accessed with a 21 gauge micropuncture needle and image documentation was performed. A 0.018 wire was introduced in to the vein. Over this, a 5 [place] dual lumen power injectable PICC was advanced to the lower SVC/right atrial junction. Fluoroscopy during the procedure and fluoro spot radiograph confirms appropriate catheter position. The catheter was flushed and covered with a sterile dressing. Catheter length: 42 cm Complications: None Estimated blood loss: Minimal IMPRESSION: Successful right arm Power PICC line placement with ultrasound and fluoroscopic guidance. The catheter is ready for use. Electronically Signed: By: Markus Daft M.D. On: 05/27/2015 08:19   Ir US Guide Vasc Access Right  05/23/2015  CLINICAL DATA:  Diabetic ketoacidosis, needs venous access for IV therapies EXAM: PICC PLACEMENT WITH ULTRASOUND AND FLUOROSCOPY FLUOROSCOPY TIME:  0.1 minute, 11 uGym2 DAP TECHNIQUE: After written informed consent was obtained, patient was placed in the  supine position on angiographic table. Patency of the right basilic vein was confirmed with ultrasound with image documentation. An appropriate skin site was determined. Skin site was marked. Region was prepped using maximum barrier technique including cap and mask, sterile gown, sterile gloves, large sterile sheet, and Chlorhexidine as cutaneous antisepsis. The region was infiltrated locally with 1% lidocaine. Under real-time ultrasound guidance, the right basilic vein was accessed with a 21 gauge micropuncture needle; the needle tip within the vein was confirmed with ultrasound image documentation. Needle exchanged over a 018  guidewire for a peel-away sheath, through which a 5-French double-lumen power injectable PICC trimmed to 38cm was advanced, positioned with its tip near the cavoatrial junction. Spot chest radiograph confirms appropriate catheter position. Catheter was flushed per protocol and secured externally. The patient tolerated procedure well. COMPLICATIONS: COMPLICATIONS none IMPRESSION: 1. Technically successful five [place] double lumen power injectable PICC placement Electronically Signed   By: Lucrezia Europe M.D.   On: 05/23/2015 16:40   Dg Chest Port 1 View  06/18/2015  CLINICAL DATA:  Adjustment of central line. EXAM: PORTABLE CHEST 1 VIEW COMPARISON:  Yesterday. FINDINGS: The right IJ catheter tip is at the cavoatrial junction. The heart size appears normal. There is no pleural effusion or edema. No airspace consolidation identified. IMPRESSION: 1. No acute cardiopulmonary abnormalities. 2. Status post right IJ catheter retraction. Tip is now at the superior cavoatrial junction. Electronically Signed   By: Kerby Moors M.D.   On: 06/18/2015 14:34   Dg Chest Port 1 View  06/17/2015  CLINICAL DATA:  Central line placement EXAM: PORTABLE CHEST 1 VIEW COMPARISON:  May 23, 2015 FINDINGS: The heart size and mediastinal contours are within normal limits. Right jugular central venous line is identified with distal tip in the right atrium. Both lungs are clear. There is no pneumothorax. The visualized skeletal structures are unremarkable. IMPRESSION: No active cardial disease. Right jugular central venous line with distal tip in the right atrium. There is no pneumothorax. Electronically Signed   By: Abelardo Diesel M.D.   On: 06/17/2015 21:15   Dg Abd Acute W/chest  05/23/2015  CLINICAL DATA:  49 year old female with chest pain cough and abdominal pain today. Initial encounter. EXAM: DG ABDOMEN ACUTE W/ 1V CHEST COMPARISON:  Chest radiographs 05/17/2015 and earlier. CT Abdomen and Pelvis 05/14/2015 FINDINGS: Semi  upright AP view of the chest at 1137 hours. The lungs appear clear. Normal cardiac size and mediastinal contours. Visualized tracheal air column is within normal limits. No pneumothorax or pneumoperitoneum. No pneumoperitoneum on the left-side-down lateral decubitus view of the abdomen. Non obstructed bowel gas pattern, retained stool from the descending colon to the rectum. Decompressed stomach. Stable cholecystectomy clips. Abdominal and pelvic visceral contours are within normal limits. No acute osseous abnormality identified. IMPRESSION: 1.  Normal bowel gas pattern, no free air. 2.  No acute cardiopulmonary abnormality. Electronically Signed   By: Genevie Ann M.D.   On: 05/23/2015 12:16   Ir Fluoro Guide Cv Midline Picc Right  05/27/2015  ADDENDUM REPORT: 05/27/2015 10:51 ADDENDUM: Nesacaine was used as a local anesthetic rather than lidocaine due to an allergy. Electronically Signed   By: Markus Daft M.D.   On: 05/27/2015 10:51  05/27/2015  CLINICAL DATA:  49 year old with gastroparesis, persistent retching and poor venous access. EXAM: POWER PICC LINE PLACEMENT WITH ULTRASOUND AND FLUOROSCOPIC GUIDANCE FLUOROSCOPY TIME:  12 seconds, 2 mGy PROCEDURE: The patient was advised of the possible risks and complications and agreed to undergo the procedure.  The patient was then brought to the angiographic suite for the procedure. The right arm was prepped with chlorhexidine, draped in the usual sterile fashion using maximum barrier technique (cap and mask, sterile gown, sterile gloves, large sterile sheet, hand hygiene and cutaneous antisepsis) and infiltrated locally with 1% Lidocaine. Ultrasound demonstrated patency of the right brachial vein, and this was documented with an image. Under real-time ultrasound guidance, this vein was accessed with a 21 gauge micropuncture needle and image documentation was performed. A 0.018 wire was introduced in to the vein. Over this, a 5 [place] dual lumen power injectable PICC was  advanced to the lower SVC/right atrial junction. Fluoroscopy during the procedure and fluoro spot radiograph confirms appropriate catheter position. The catheter was flushed and covered with a sterile dressing. Catheter length: 42 cm Complications: None Estimated blood loss: Minimal IMPRESSION: Successful right arm Power PICC line placement with ultrasound and fluoroscopic guidance. The catheter is ready for use. Electronically Signed: By: Markus Daft M.D. On: 05/27/2015 08:19    Microbiology: Recent Results (from the past 240 hour(s))  MRSA PCR Screening     Status: None   Collection Time: 06/14/15  7:16 PM  Result Value Ref Range Status   MRSA by PCR NEGATIVE NEGATIVE Final    Comment:        The GeneXpert MRSA Assay (FDA approved for NASAL specimens only), is one component of a comprehensive MRSA colonization surveillance program. It is not intended to diagnose MRSA infection nor to guide or monitor treatment for MRSA infections.      Labs: Basic Metabolic Panel:  Recent Labs Lab 06/14/15 1900  06/15/15 1415 06/15/15 2152 06/16/15 0210 06/16/15 0642 06/17/15 0010  NA 140  < > 141 138 138 135 139  K 4.1  < > 4.4 3.4* 3.2* 3.6 3.3*  CL 109  < > 110 108 109 106 112*  CO2 15*  < > 15* 19* 18* 21* 20*  GLUCOSE 327*  < > 167* 163* 236* 120* 138*  BUN 14  < > 6 6 7 7 7   CREATININE 1.11*  < > 0.71 0.79 0.93 0.87 0.87  CALCIUM 9.5  < > 8.9 8.9 8.5* 8.3* 8.0*  MG 1.9  --   --   --   --   --   --   < > = values in this interval not displayed. Liver Function Tests:  Recent Labs Lab 06/14/15 1250 06/15/15 0230  AST 33 22  ALT 17 13*  ALKPHOS 96 74  BILITOT 1.4* 0.9  PROT 8.0 6.8  ALBUMIN 4.2 3.4*    Recent Labs Lab 06/14/15 1250  LIPASE 26   No results for input(s): AMMONIA in the last 168 hours. CBC:  Recent Labs Lab 06/14/15 1250 06/15/15 0230  WBC 8.8 11.1*  HGB 10.4* 8.8*  HCT 32.8* 27.0*  MCV 85.6 84.4  PLT 527* 490*   Cardiac Enzymes: No results  for input(s): CKTOTAL, CKMB, CKMBINDEX, TROPONINI in the last 168 hours. BNP: BNP (last 3 results) No results for input(s): BNP in the last 8760 hours.  ProBNP (last 3 results) No results for input(s): PROBNP in the last 8760 hours.  CBG:  Recent Labs Lab 06/19/15 1737 06/19/15 2135 06/19/15 2231 06/20/15 0752 06/20/15 1119  GLUCAP 123* 68 109* 322* 230*     Signed:  Velvet Bathe MD  FACP  [facility] 06/20/2015, 12:45 PM

## 2015-06-20 NOTE — Progress Notes (Addendum)
CSW was following pt as pt admitted from [facility].  Per handoff, pt unable to return to SNF as pt no longer has [employer]. Per [place], [place] discharged pt from their facility to home on the day that pt came to the hospital.  CSW spoke with [facility] who spoke with pt today and pt plans to discharge home with pt son support. Per RNCM, pt was aware that return to SNF would not be an option. RNCM assisting with discharge planning for home.   No further social work needs identified at this time as pt will be discharging to Home.  CSW signing off.   Alison Murray, MSW, [employer] [phone]

## 2015-06-20 NOTE — Progress Notes (Signed)
Patient requesting xanax to help her sleep. States ativan earlier did not make her sleepy. Paged NP on call and orders obtained.

## 2015-06-20 NOTE — Plan of Care (Signed)
Problem: Nutritional: Goal: Maintenance of adequate nutrition will improve Outcome: Progressing Patient reports appetite improving.

## 2015-06-20 NOTE — Progress Notes (Signed)
LBM 12/23. Paged NP on call and order for colace obtained.

## 2015-06-21 LAB — GLUCOSE, CAPILLARY: Glucose-Capillary: 34 mg/dL — CL (ref 65–99)

## 2015-06-22 ENCOUNTER — Inpatient Hospital Stay (HOSPITAL_COMMUNITY)
Admission: EM | Admit: 2015-06-22 | Discharge: 2015-06-25 | DRG: 074 | Disposition: A | Discharge status: Home / Self Care | Payer: 59 | Attending: Internal Medicine | Admitting: Internal Medicine

## 2015-06-22 ENCOUNTER — Encounter (HOSPITAL_COMMUNITY): Payer: Self-pay | Admitting: Emergency Medicine

## 2015-06-22 DIAGNOSIS — E1010 Type 1 diabetes mellitus with ketoacidosis without coma: Secondary | ICD-10-CM

## 2015-06-22 DIAGNOSIS — R112 Nausea with vomiting, unspecified: Secondary | ICD-10-CM | POA: Diagnosis present

## 2015-06-22 DIAGNOSIS — Z9119 Patient's noncompliance with other medical treatment and regimen: Secondary | ICD-10-CM

## 2015-06-22 DIAGNOSIS — E1043 Type 1 diabetes mellitus with diabetic autonomic (poly)neuropathy: Principal | ICD-10-CM | POA: Diagnosis present

## 2015-06-22 DIAGNOSIS — N179 Acute kidney failure, unspecified: Secondary | ICD-10-CM

## 2015-06-22 DIAGNOSIS — E1143 Type 2 diabetes mellitus with diabetic autonomic (poly)neuropathy: Secondary | ICD-10-CM | POA: Diagnosis not present

## 2015-06-22 DIAGNOSIS — Z79899 Other long term (current) drug therapy: Secondary | ICD-10-CM

## 2015-06-22 DIAGNOSIS — D509 Iron deficiency anemia, unspecified: Secondary | ICD-10-CM | POA: Diagnosis present

## 2015-06-22 DIAGNOSIS — E876 Hypokalemia: Secondary | ICD-10-CM | POA: Diagnosis present

## 2015-06-22 DIAGNOSIS — N183 Chronic kidney disease, stage 3 (moderate): Secondary | ICD-10-CM

## 2015-06-22 DIAGNOSIS — K3184 Gastroparesis: Secondary | ICD-10-CM

## 2015-06-22 DIAGNOSIS — I129 Hypertensive chronic kidney disease with stage 1 through stage 4 chronic kidney disease, or unspecified chronic kidney disease: Secondary | ICD-10-CM | POA: Diagnosis present

## 2015-06-22 DIAGNOSIS — D638 Anemia in other chronic diseases classified elsewhere: Secondary | ICD-10-CM

## 2015-06-22 DIAGNOSIS — I272 Other secondary pulmonary hypertension: Secondary | ICD-10-CM | POA: Diagnosis present

## 2015-06-22 DIAGNOSIS — D631 Anemia in chronic kidney disease: Secondary | ICD-10-CM | POA: Diagnosis present

## 2015-06-22 DIAGNOSIS — F411 Generalized anxiety disorder: Secondary | ICD-10-CM | POA: Diagnosis present

## 2015-06-22 HISTORY — DX: Anxiety disorder, unspecified: F41.9

## 2015-06-22 HISTORY — DX: Major depressive disorder, single episode, unspecified: F32.9

## 2015-06-22 LAB — IRON AND TIBC
IRON: 12 ug/dL — AB (ref 28–170)
SATURATION RATIOS: 3 % — AB (ref 10.4–31.8)
TIBC: 412 ug/dL (ref 250–450)
UIBC: 400 ug/dL

## 2015-06-22 LAB — BASIC METABOLIC PANEL
ANION GAP: 17 — AB (ref 5–15)
Anion gap: 12 (ref 5–15)
Anion gap: 11 (ref 5–15)
BUN: 14 mg/dL (ref 6–20)
BUN: 13 mg/dL (ref 6–20)
BUN: 11 mg/dL (ref 6–20)
CHLORIDE: 110 mmol/L (ref 101–111)
CHLORIDE: 113 mmol/L — AB (ref 101–111)
CHLORIDE: 112 mmol/L — AB (ref 101–111)
CO2: 14 mmol/L — ABNORMAL LOW (ref 22–32)
CO2: 17 mmol/L — ABNORMAL LOW (ref 22–32)
CO2: 18 mmol/L — ABNORMAL LOW (ref 22–32)
CREATININE: 1.05 mg/dL — AB (ref 0.44–1.00)
Calcium: 9.2 mg/dL (ref 8.9–10.3)
Calcium: 8.8 mg/dL — ABNORMAL LOW (ref 8.9–10.3)
Calcium: 8.8 mg/dL — ABNORMAL LOW (ref 8.9–10.3)
Creatinine, Ser: 1.17 mg/dL — ABNORMAL HIGH (ref 0.44–1.00)
Creatinine, Ser: 1.01 mg/dL — ABNORMAL HIGH (ref 0.44–1.00)
GFR calc non Af Amer: 54 mL/min — ABNORMAL LOW (ref >60)
GFR calc non Af Amer: >60 mL/min (ref >60)
GFR calc non Af Amer: >60 mL/min (ref >60)
Glucose, Bld: 224 mg/dL — ABNORMAL HIGH (ref 65–99)
Glucose, Bld: 153 mg/dL — ABNORMAL HIGH (ref 65–99)
Glucose, Bld: 155 mg/dL — ABNORMAL HIGH (ref 65–99)
POTASSIUM: 3.6 mmol/L (ref 3.5–5.1)
POTASSIUM: 3.9 mmol/L (ref 3.5–5.1)
POTASSIUM: 3.4 mmol/L — AB (ref 3.5–5.1)
SODIUM: 141 mmol/L (ref 135–145)
SODIUM: 142 mmol/L (ref 135–145)
SODIUM: 141 mmol/L (ref 135–145)

## 2015-06-22 LAB — COMPREHENSIVE METABOLIC PANEL
ALT: 16 U/L (ref 14–54)
AST: 30 U/L (ref 15–41)
Albumin: 3.6 g/dL (ref 3.5–5.0)
Alkaline Phosphatase: 85 U/L (ref 38–126)
Anion gap: 23 — ABNORMAL HIGH (ref 5–15)
BILIRUBIN TOTAL: 1.7 mg/dL — AB (ref 0.3–1.2)
BUN: 16 mg/dL (ref 6–20)
CHLORIDE: 102 mmol/L (ref 101–111)
CO2: 11 mmol/L — ABNORMAL LOW (ref 22–32)
CREATININE: 1.24 mg/dL — AB (ref 0.44–1.00)
Calcium: 9.1 mg/dL (ref 8.9–10.3)
GFR calc Af Amer: 59 mL/min — ABNORMAL LOW (ref >60)
GFR, EST NON AFRICAN AMERICAN: 51 mL/min — AB (ref >60)
Glucose, Bld: 484 mg/dL — ABNORMAL HIGH (ref 65–99)
Potassium: 4.2 mmol/L (ref 3.5–5.1)
Sodium: 136 mmol/L (ref 135–145)
Total Protein: 6.8 g/dL (ref 6.5–8.1)

## 2015-06-22 LAB — URINALYSIS, ROUTINE W REFLEX MICROSCOPIC
BILIRUBIN URINE: NEGATIVE
LEUKOCYTES UA: NEGATIVE
Nitrite: NEGATIVE
PROTEIN: NEGATIVE mg/dL
Specific Gravity, Urine: 1.023 (ref 1.005–1.030)
pH: 5.5 (ref 5.0–8.0)

## 2015-06-22 LAB — FERRITIN: Ferritin: 33 ng/mL (ref 11–307)

## 2015-06-22 LAB — GLUCOSE, CAPILLARY
GLUCOSE-CAPILLARY: 185 mg/dL — AB (ref 65–99)
GLUCOSE-CAPILLARY: 125 mg/dL — AB (ref 65–99)
GLUCOSE-CAPILLARY: 159 mg/dL — AB (ref 65–99)
GLUCOSE-CAPILLARY: 161 mg/dL — AB (ref 65–99)
Glucose-Capillary: 179 mg/dL — ABNORMAL HIGH (ref 65–99)
Glucose-Capillary: 138 mg/dL — ABNORMAL HIGH (ref 65–99)
Glucose-Capillary: 135 mg/dL — ABNORMAL HIGH (ref 65–99)

## 2015-06-22 LAB — KETONES, URINE: Ketones, ur: >80 mg/dL — AB

## 2015-06-22 LAB — CBG MONITORING, ED
GLUCOSE-CAPILLARY: 371 mg/dL — AB (ref 65–99)
Glucose-Capillary: 457 mg/dL — ABNORMAL HIGH (ref 65–99)
Glucose-Capillary: 228 mg/dL — ABNORMAL HIGH (ref 65–99)

## 2015-06-22 LAB — CBC
HCT: 26.3 % — ABNORMAL LOW (ref 36.0–46.0)
Hemoglobin: 8.5 g/dL — ABNORMAL LOW (ref 12.0–15.0)
MCH: 27 pg (ref 26.0–34.0)
MCHC: 32.3 g/dL (ref 30.0–36.0)
MCV: 83.5 fL (ref 78.0–100.0)
PLATELETS: 338 10*3/uL (ref 150–400)
RBC: 3.15 MIL/uL — ABNORMAL LOW (ref 3.87–5.11)
RDW: 16.5 % — AB (ref 11.5–15.5)
WBC: 10.6 10*3/uL — AB (ref 4.0–10.5)

## 2015-06-22 LAB — LIPASE, BLOOD: LIPASE: 43 U/L (ref 11–51)

## 2015-06-22 LAB — VITAMIN B12: VITAMIN B 12: 147 pg/mL — AB (ref 180–914)

## 2015-06-22 LAB — URINE MICROSCOPIC-ADD ON

## 2015-06-22 LAB — C DIFFICILE QUICK SCREEN W PCR REFLEX
C DIFFICILE (CDIFF) INTERP: NEGATIVE
C Diff antigen: NEGATIVE
C Diff toxin: NEGATIVE

## 2015-06-22 LAB — FOLATE: FOLATE: 38.4 ng/mL (ref >5.9)

## 2015-06-22 LAB — RETICULOCYTES
RBC.: 3.21 MIL/uL — ABNORMAL LOW (ref 3.87–5.11)
RETIC CT PCT: 1.5 % (ref 0.4–3.1)
Retic Count, Absolute: 48.2 10*3/uL (ref 19.0–186.0)

## 2015-06-22 LAB — OCCULT BLOOD X 1 CARD TO LAB, STOOL: Fecal Occult Bld: POSITIVE — AB

## 2015-06-22 MED ORDER — HYDRALAZINE HCL 20 MG/ML IJ SOLN: 2.0000 mg | Freq: Four times a day (QID) | INTRAMUSCULAR | Status: DC | PRN

## 2015-06-22 MED ORDER — MORPHINE SULFATE (PF) 4 MG/ML IV SOLN
4.0000 mg | Freq: Once | INTRAVENOUS | Status: AC
  Administered 2015-06-22: 4 mg via INTRAVENOUS
  Filled 2015-06-22: qty 1

## 2015-06-22 MED ORDER — SODIUM CHLORIDE 0.9 % IV SOLN
INTRAVENOUS | Status: DC
  Administered 2015-06-22: 4 [IU]/h via INTRAVENOUS
  Filled 2015-06-22: qty 2.5

## 2015-06-22 MED ORDER — SODIUM CHLORIDE 0.9 % IV BOLUS (SEPSIS)
1000.0000 mL | Freq: Once | INTRAVENOUS | Status: AC
  Administered 2015-06-22: 1000 mL via INTRAVENOUS

## 2015-06-22 MED ORDER — HEPARIN SODIUM (PORCINE) 5000 UNIT/ML IJ SOLN
5000.0000 [IU] | Freq: Three times a day (TID) | INTRAMUSCULAR | Status: DC
  Administered 2015-06-22 – 2015-06-24 (×7): 5000 [IU] via SUBCUTANEOUS
  Filled 2015-06-22 (×8): qty 1

## 2015-06-22 MED ORDER — SODIUM CHLORIDE 0.9 % IV SOLN
INTRAVENOUS | Status: DC
  Filled 2015-06-22: qty 2.5

## 2015-06-22 MED ORDER — PROMETHAZINE HCL 25 MG RE SUPP
25.0000 mg | Freq: Four times a day (QID) | RECTAL | Status: DC | PRN
  Administered 2015-06-22: 25 mg via RECTAL
  Filled 2015-06-22 (×2): qty 1

## 2015-06-22 MED ORDER — ONDANSETRON HCL 4 MG/2ML IJ SOLN
4.0000 mg | Freq: Once | INTRAMUSCULAR | Status: AC
  Administered 2015-06-22: 4 mg via INTRAVENOUS
  Filled 2015-06-22: qty 2

## 2015-06-22 MED ORDER — LORAZEPAM 1 MG PO TABS: 1.0000 mg | ORAL_TABLET | Freq: Once | ORAL | Status: DC

## 2015-06-22 MED ORDER — ALPRAZOLAM 0.25 MG PO TABS
0.2500 mg | ORAL_TABLET | Freq: Three times a day (TID) | ORAL | Status: DC | PRN
  Administered 2015-06-22 – 2015-06-25 (×6): 0.25 mg via ORAL
  Filled 2015-06-22 (×7): qty 1

## 2015-06-22 MED ORDER — INSULIN REGULAR BOLUS VIA INFUSION
0.0000 [IU] | Freq: Three times a day (TID) | INTRAVENOUS | Status: DC
  Administered 2015-06-22: 0 [IU] via INTRAVENOUS
  Filled 2015-06-22: qty 10

## 2015-06-22 MED ORDER — POTASSIUM CHLORIDE 10 MEQ/100ML IV SOLN
10.0000 meq | INTRAVENOUS | Status: AC
  Administered 2015-06-22 (×2): 10 meq via INTRAVENOUS
  Filled 2015-06-22 (×2): qty 100

## 2015-06-22 MED ORDER — PANTOPRAZOLE SODIUM 40 MG IV SOLR
40.0000 mg | INTRAVENOUS | Status: DC
  Administered 2015-06-22 – 2015-06-24 (×3): 40 mg via INTRAVENOUS
  Filled 2015-06-22 (×4): qty 40

## 2015-06-22 MED ORDER — POTASSIUM CHLORIDE 10 MEQ/100ML IV SOLN
10.0000 meq | Freq: Once | INTRAVENOUS | Status: AC
  Administered 2015-06-22: 10 meq via INTRAVENOUS
  Filled 2015-06-22: qty 100

## 2015-06-22 MED ORDER — ONDANSETRON HCL 4 MG/2ML IJ SOLN
4.0000 mg | INTRAMUSCULAR | Status: DC
  Administered 2015-06-22 – 2015-06-24 (×12): 4 mg via INTRAVENOUS
  Filled 2015-06-22 (×12): qty 2

## 2015-06-22 MED ORDER — ONDANSETRON HCL 4 MG/2ML IJ SOLN
INTRAMUSCULAR | Status: AC
  Administered 2015-06-22: 4 mg
  Filled 2015-06-22: qty 2

## 2015-06-22 MED ORDER — ONDANSETRON HCL 4 MG/2ML IJ SOLN
4.0000 mg | INTRAMUSCULAR | Status: DC
  Administered 2015-06-22: 4 mg via INTRAVENOUS
  Filled 2015-06-22: qty 2

## 2015-06-22 MED ORDER — SODIUM CHLORIDE 0.9 % IV SOLN
INTRAVENOUS | Status: DC
  Administered 2015-06-22: 11:00:00 via INTRAVENOUS

## 2015-06-22 MED ORDER — ONDANSETRON HCL 4 MG/2ML IJ SOLN
4.0000 mg | Freq: Once | INTRAMUSCULAR | Status: AC | PRN
  Administered 2015-06-22: 4 mg via INTRAVENOUS
  Filled 2015-06-22: qty 2

## 2015-06-22 MED ORDER — LORAZEPAM 2 MG/ML IJ SOLN
1.0000 mg | Freq: Once | INTRAMUSCULAR | Status: AC
  Administered 2015-06-22: 1 mg via INTRAVENOUS
  Filled 2015-06-22: qty 1

## 2015-06-22 MED ORDER — DEXTROSE-NACL 5-0.45 % IV SOLN
INTRAVENOUS | Status: DC
  Administered 2015-06-22 – 2015-06-23 (×3): via INTRAVENOUS

## 2015-06-22 NOTE — ED Notes (Signed)
Incontinent of large amount of loose brown stool.  Incontinence care given.

## 2015-06-22 NOTE — ED Notes (Signed)
Attempted report 

## 2015-06-22 NOTE — ED Provider Notes (Signed)
CSN: [id number]     Arrival date & time 06/22/15  0417 History   First MD Initiated Contact with Patient 06/22/15 [phone]     Chief Complaint  Patient presents with  . Nausea  . Emesis   HPI   49 year old female with history of chronic abdominal pain due to severe gastroparesis presents today with DKA. Patient was recently discharged from [facility] on 06/20/2015 with the diagnosis of DKA. She reports that she has been nauseous unable to tolerate her medications, has not used her insulin. Today she is alert and oriented, reports diffuse abdominal pain, unable to tolerate by mouth. She denies any exposure to abnormal food or drink, close sick contacts..   Past Medical History  Diagnosis Date  . Diabetes mellitus without complication ([place])   . Gastroparesis   . Anemia   . Intractable nausea and vomiting   . Hydronephrosis     bilateral  . CKD (chronic kidney disease)   . Anxiety and depression    Past Surgical History  Procedure Laterality Date  . Retinal detachment surgery    . Posterior vitrectomy and membrane peel-left eye  09/28/2002  . Posterior vitrectomy and membrane peel-right eye  03/16/2002  . Eye surgery    . Gailstones    . Esophagogastroduodenoscopy  09/27/2014    at [facility], Dr Rolan Lipa. biospy neg for celiac, neg for H pylori.   . Colonoscopy  09/27/2014    at [facility]   Family History  Problem Relation Age of Onset  . Cystic fibrosis Mother   . Hypertension Father   . Diabetes Brother   . Hypertension Maternal Grandmother    Social History  Substance Use Topics  . Smoking status: Never Smoker   . Smokeless tobacco: Never Used  . Alcohol Use: No   OB History    No data available     Review of Systems  All other systems reviewed and are negative.   Allergies  Anesthetics, amide; Penicillins; Buprenorphine hcl; Encainide; and Metoclopramide  Home Medications   Prior to Admission medications   Medication Sig Start Date End Date Taking? Authorizing  Provider  ALPRAZolam (XANAX) 0.25 MG tablet Take 1 tablet (0.25 mg total) by mouth 3 (three) times daily as needed for anxiety. Patient taking differently: Take 0.25 mg by mouth at bedtime as needed for sleep.  05/20/15  Yes Robbie Lis, MD  dronabinol (MARINOL) 10 MG capsule Take 10 mg by mouth 2 (two) times daily. 02/04/15  Yes Historical Provider, MD  FLUoxetine (PROZAC) 40 MG capsule Take 40 mg by mouth daily.   Yes Historical Provider, MD  insulin aspart (NOVOLOG) 100 UNIT/ML injection Inject 0-15 Units into the skin 3 (three) times daily with meals. 6-15 units three times daily with food.  Per sliding scale Patient taking differently: Inject 0-15 Units into the skin 3 (three) times daily with meals. 2-15 units three times daily with food. Per sliding scale 03/12/15  Yes Belkys A Regalado, MD  insulin detemir (LEVEMIR) 100 UNIT/ML injection Inject 0.1 mLs (10 Units total) into the skin at bedtime. Patient taking differently: Inject 15 Units into the skin at bedtime.  06/04/15  Yes Velvet Bathe, MD  ondansetron (ZOFRAN ODT) 4 MG disintegrating tablet Take 1 tablet (4 mg total) by mouth every 8 (eight) hours as needed for nausea. 06/20/15  Yes Velvet Bathe, MD  pantoprazole (PROTONIX) 40 MG tablet Take 40 mg by mouth daily.  01/04/15  Yes Historical Provider, MD  pregabalin (LYRICA) 150  MG capsule Take 1 capsule (150 mg total) by mouth 2 (two) times daily. 02/17/13  Yes Robbie Lis, MD  promethazine (PHENERGAN) 25 MG suppository Place 1 suppository (25 mg total) rectally every 6 (six) hours as needed for nausea or vomiting. 04/27/15   Samantha Tripp Dowless, PA-C   BP 150/68 mmHg  Pulse 103  Temp(Src) 98.7 F (37.1 C) (Oral)  Resp 15  Ht 5\' 2"  (1.575 m)  Wt 54.432 kg  BMI 21.94 kg/m2  SpO2 100%  LMP 05/31/2015   Physical Exam  Constitutional: She is oriented to person, place, and time. She appears well-developed. She appears distressed.  HENT:  Head: Normocephalic and atraumatic.   Eyes: Conjunctivae are normal. Pupils are equal, round, and reactive to light. Right eye exhibits no discharge. Left eye exhibits no discharge. No scleral icterus.  Neck: Normal range of motion. No JVD present. No tracheal deviation present.  Pulmonary/Chest: Effort normal and breath sounds normal. No stridor. No respiratory distress. She has no wheezes. She has no rales. She exhibits no tenderness.  Abdominal: She exhibits no distension. There is tenderness. There is no rebound and no guarding.  Musculoskeletal: Normal range of motion. She exhibits no edema or tenderness.  Neurological: She is alert and oriented to person, place, and time. Coordination normal.  Skin: Skin is warm and dry. No rash noted. No erythema. No pallor.  Psychiatric: Her behavior is normal. Judgment and thought content normal.  Nursing note and vitals reviewed.   ED Course  Procedures (including critical care time) Labs Review Labs Reviewed  COMPREHENSIVE METABOLIC PANEL - Abnormal; Notable for the following:    CO2 11 (*)    Glucose, Bld 484 (*)    Creatinine, Ser 1.24 (*)    Total Bilirubin 1.7 (*)    GFR calc non Af Amer 51 (*)    GFR calc Af Amer 59 (*)    Anion gap 23 (*)    All other components within normal limits  CBC - Abnormal; Notable for the following:    WBC 10.6 (*)    RBC 3.15 (*)    Hemoglobin 8.5 (*)    HCT 26.3 (*)    RDW 16.5 (*)    All other components within normal limits  URINALYSIS, ROUTINE W REFLEX MICROSCOPIC (NOT AT [facility]) - Abnormal; Notable for the following:    APPearance CLOUDY (*)    Glucose, UA >1000 (*)    Hgb urine dipstick SMALL (*)    Ketones, ur >80 (*)    All other components within normal limits  KETONES, URINE - Abnormal; Notable for the following:    Ketones, ur >80 (*)    All other components within normal limits  URINE MICROSCOPIC-ADD ON - Abnormal; Notable for the following:    Squamous Epithelial / LPF 0-5 (*)    Bacteria, UA MANY (*)    All other  components within normal limits  BASIC METABOLIC PANEL - Abnormal; Notable for the following:    CO2 14 (*)    Glucose, Bld 224 (*)    Creatinine, Ser 1.17 (*)    GFR calc non Af Amer 54 (*)    Anion gap 17 (*)    All other components within normal limits  BASIC METABOLIC PANEL - Abnormal; Notable for the following:    Chloride 113 (*)    CO2 17 (*)    Glucose, Bld 153 (*)    Creatinine, Ser 1.01 (*)    Calcium 8.8 (*)  All other components within normal limits  VITAMIN B12 - Abnormal; Notable for the following:    Vitamin B-12 147 (*)    All other components within normal limits  IRON AND TIBC - Abnormal; Notable for the following:    Iron 12 (*)    Saturation Ratios 3 (*)    All other components within normal limits  RETICULOCYTES - Abnormal; Notable for the following:    RBC. 3.21 (*)    All other components within normal limits  GLUCOSE, CAPILLARY - Abnormal; Notable for the following:    Glucose-Capillary 185 (*)    All other components within normal limits  OCCULT BLOOD X 1 CARD TO LAB, STOOL - Abnormal; Notable for the following:    Fecal Occult Bld POSITIVE (*)    All other components within normal limits  GLUCOSE, CAPILLARY - Abnormal; Notable for the following:    Glucose-Capillary 179 (*)    All other components within normal limits  GLUCOSE, CAPILLARY - Abnormal; Notable for the following:    Glucose-Capillary 138 (*)    All other components within normal limits  GLUCOSE, CAPILLARY - Abnormal; Notable for the following:    Glucose-Capillary 135 (*)    All other components within normal limits  GLUCOSE, CAPILLARY - Abnormal; Notable for the following:    Glucose-Capillary 125 (*)    All other components within normal limits  CBG MONITORING, ED - Abnormal; Notable for the following:    Glucose-Capillary 457 (*)    All other components within normal limits  CBG MONITORING, ED - Abnormal; Notable for the following:    Glucose-Capillary 371 (*)    All other  components within normal limits  CBG MONITORING, ED - Abnormal; Notable for the following:    Glucose-Capillary 228 (*)    All other components within normal limits  C DIFFICILE QUICK SCREEN W PCR REFLEX  GASTROINTESTINAL PANEL BY PCR, STOOL (REPLACES STOOL CULTURE)  LIPASE, BLOOD  FOLATE  FERRITIN  BASIC METABOLIC PANEL  BASIC METABOLIC PANEL  CBC  BASIC METABOLIC PANEL    Imaging Review No results found. I have personally reviewed and evaluated these images and lab results as part of my medical decision-making.   EKG Interpretation None      MDM   Final diagnoses:  Type 1 diabetes mellitus with ketoacidosis without coma (HCC)    Labs:  Imaging:  Consults:  Therapeutics: Post stabilizer, potassium, normal saline  Discharge Meds:   Assessment/Plan: 49 year old female presents today in DKA. Initial labs show glucose of 457, ketones greater than 80 in the urine, anion gap of 23, and a bicarbonate 11. Patient is alert, given normal saline, glucose stabilizes her potassium. Hospitalist service consult for inpatient management.         [name], PA-C 06/22/15 1603  Harvel Quale, MD 06/24/15 [phone]

## 2015-06-22 NOTE — Progress Notes (Signed)
Utilization Review Completed.[name] T12/28/2016  

## 2015-06-22 NOTE — ED Notes (Signed)
Checked patient blood sugar it was 228 notied [employer] of blood sugar

## 2015-06-22 NOTE — ED Notes (Signed)
Checked patient blood sugar it was 371 notified RN Janee of blood sugar

## 2015-06-22 NOTE — ED Notes (Signed)
Assisted patient to use bedpan.  Unable to collect urine due to diarrhea in pan.  Incontinence care given.

## 2015-06-22 NOTE — Progress Notes (Signed)
Inpatient Diabetes Program Recommendations  AACE/ADA: New Consensus Statement on Inpatient Glycemic Control (2015)  Target Ranges:  Prepandial:   less than 140 mg/dL      Peak postprandial:   less than 180 mg/dL (1-2 hours)      Critically ill patients:  140 - 180 mg/dL   Review of Glycemic Control  Diabetes history: DM1 Outpatient Diabetes medications: Levemir 15 units QHS, Novolog 2-15 units TID with meals Current orders for Inpatient glycemic control: IV insulin per GlucoStabilizer DKA order set  Inpatient Diabetes Program Recommendations: Insulin - IV drip/GlucoStabilizer: According to the labs, patient is still acidotic and requiring the insulin drip per DKA protocol. Insulin drip should be continued until DKA resolved.  Diet: Added Carb Modified to Heart Healthy diet currently ordered. Insulin-Meal Coverage: Noted patient has diet ordered while on IV insulin drip. Talked with RN and she states patient will likely be eating. Discussed carbohydrate coverage with the GlucoStabilizer and entered order for Novolog 0-10 units TID with meals (1 unit for every 10 grams of carbs) per protocol with cosign required. Carbohydrates consumed should be entered into the [place] and the [employer] will instruct RN how much insulin to bolus for carb.   IV insulin infusion with sufficient glucose should be continued until acidosis is corrected as determined by MD (Venous CO2=20, normal anion gap (8-12), negative ketones). Once acidosis is cleared, may want to consider ordering Levemir 10 units Q24H and Novolog 0-9 units Q4H. Per DKA protocol, RN to discontinue insulin drip 2 hours after subcutaneous basal insulin given and simultaneously give Novolog correction scale as ordered.  Thanks, Barnie Alderman, RN, MSN, CDE Diabetes Coordinator Inpatient Diabetes Program [phone] (Team Pager from [place] to [place]) [phone] ([place] office) [phone] [facility] office) [phone] [place] office)

## 2015-06-22 NOTE — H&P (Signed)
[facility] History and Physical  Soma Gentle [id number] DOB: [birth year] DOA: 06/22/2015  Referring physician: Penni Bombard PCP: Barbette Merino, MD   Chief Complaint: intractable nausea/vomiting/abdominal pain, diarrhea  HPI: [name] is a 49 y.o. female history of type 1 diabetes, hypertension, anxiety, gastroparesis and diabetic neuropathy presents to the emergency department with the chief complaint of intractable nausea vomiting, abdominal pain and diarrhea. Initial evaluation reveals DKA with an anion gap of 23, bicarb of 11 serum glucose 485.  Patient reports having been discharged 2 days ago after a six-day stay for the same. He reports she had one good day and then the nausea came back. Associated symptoms include abdominal pain vomiting and diarrhea with generalized weakness.. She denies any fever chills headache coffee ground emesis. He reports 3 episodes of loose stool denies bright red blood per rectum or melena. She reports waiting for insurance to come through and surgery is planned at [facility] for her chronic gastroparesis.  In the ED she is afebrile no dynamically stable with mild tachycardia and not hypoxic. In sun drip is initiated  Review of Systems:  10 point review of systems complete and all systems are negative except as indicated in the history of present illness Past Medical History  Diagnosis Date  . Diabetes mellitus without complication ([place])   . Gastroparesis   . Anemia   . Intractable nausea and vomiting    Past Surgical History  Procedure Laterality Date  . Retinal detachment surgery    . Posterior vitrectomy and membrane peel-left eye  09/28/2002  . Posterior vitrectomy and membrane peel-right eye  03/16/2002  . Eye surgery    . Gailstones    . Esophagogastroduodenoscopy  09/27/2014    at [facility], Dr Rolan Lipa. biospy neg for celiac, neg for H pylori.   . Colonoscopy  09/27/2014    at [facility]   Social History:  reports that she has  never smoked. She has never used smokeless tobacco. She reports that she does not drink alcohol or use illicit drugs. Lives at home with her son. She uses a cane for ambulation. She is moderate assist with ADLs Allergies  Allergen Reactions  . Anesthetics, Amide Nausea And Vomiting  . Penicillins Diarrhea and Nausea And Vomiting    Has patient had a PCN reaction causing immediate rash, facial/tongue/throat swelling, SOB or lightheadedness with hypotension: No Has patient had a PCN reaction causing severe rash involving mucus membranes or skin necrosis: No Has patient had a PCN reaction that required hospitalization No Has patient had a PCN reaction occurring within the last 10 years: Yes  If all of the above answers are "NO", then may proceed with Cephalosporin use.   . Buprenorphine Hcl Rash  . Encainide Nausea And Vomiting  . Metoclopramide Rash    Family History  Problem Relation Age of Onset  . Cystic fibrosis Mother   . Hypertension Father   . Diabetes Brother   . Hypertension Maternal Grandmother      Prior to Admission medications   Medication Sig Start Date End Date Taking? Authorizing Provider  ALPRAZolam (XANAX) 0.25 MG tablet Take 1 tablet (0.25 mg total) by mouth 3 (three) times daily as needed for anxiety. Patient taking differently: Take 0.25 mg by mouth at bedtime as needed for sleep.  05/20/15   Robbie Lis, MD  dronabinol (MARINOL) 10 MG capsule Take 10 mg by mouth 2 (two) times daily. 02/04/15   Historical Provider, MD  FLUoxetine (PROZAC) 40 MG capsule Take 40  mg by mouth daily.    Historical Provider, MD  insulin aspart (NOVOLOG) 100 UNIT/ML injection Inject 0-15 Units into the skin 3 (three) times daily with meals. 6-15 units three times daily with food.  Per sliding scale Patient taking differently: Inject 0-15 Units into the skin 3 (three) times daily with meals. 2-15 units three times daily with food. Per sliding scale 03/12/15   Belkys A Regalado, MD    insulin detemir (LEVEMIR) 100 UNIT/ML injection Inject 0.1 mLs (10 Units total) into the skin at bedtime. 06/04/15   Velvet Bathe, MD  ondansetron (ZOFRAN ODT) 4 MG disintegrating tablet Take 1 tablet (4 mg total) by mouth every 8 (eight) hours as needed for nausea. 06/20/15   Velvet Bathe, MD  pantoprazole (PROTONIX) 40 MG tablet Take 40 mg by mouth daily.  01/04/15   Historical Provider, MD  pregabalin (LYRICA) 150 MG capsule Take 1 capsule (150 mg total) by mouth 2 (two) times daily. 02/17/13   Robbie Lis, MD  promethazine (PHENERGAN) 25 MG suppository Place 1 suppository (25 mg total) rectally every 6 (six) hours as needed for nausea or vomiting. 04/27/15   Carlos Levering, PA-C   Physical Exam: Filed Vitals:   06/22/15 0845 06/22/15 0900 06/22/15 0915 06/22/15 0930  BP: 156/80 142/79 148/92 133/77  Pulse: 114 110 104 104  Temp:      TempSrc:      Resp:    18  Height:      Weight:      SpO2: 100% 100% 100% 100%    Wt Readings from Last 3 Encounters:  06/22/15 54.432 kg (120 lb)  06/19/15 54.6 kg (120 lb 5.9 oz)  05/28/15 56.8 kg (125 lb 3.5 oz)    General:  Appears slightly anxious and somewhat uncomfortable Eyes: PERRL, normal lids, irises & conjunctiva ENT: grossly normal hearing, his membranes of her mouth are pink but dry Neck: no LAD, masses or thyromegaly Cardiovascular: tachycardic but regular no m/r/g. No LE edema.  Respiratory: CTA bilaterally, no w/r/r. Normal respiratory effort slightly shallow Abdomen: soft, distended positive bowel sounds throughout mild diffuse tenderness to palpation Skin: no rash or induration seen on limited exam Musculoskeletal: grossly normal tone BUE/BLE, Psychiatric: grossly normal mood and affect, speech fluent and appropriate Neurologic: grossly non-focal. Alert oriented. Clenching of jaw/grinding of teeth          Labs on Admission:  Basic Metabolic Panel:  Recent Labs Lab 06/15/15 2152 06/16/15 0210 06/16/15 0642  06/17/15 0010 06/22/15 0455  NA 138 138 135 139 136  K 3.4* 3.2* 3.6 3.3* 4.2  CL 108 109 106 112* 102  CO2 19* 18* 21* 20* 11*  GLUCOSE 163* 236* 120* 138* 484*  BUN 6 7 7 7 16   CREATININE 0.79 0.93 0.87 0.87 1.24*  CALCIUM 8.9 8.5* 8.3* 8.0* 9.1   Liver Function Tests:  Recent Labs Lab 06/22/15 0455  AST 30  ALT 16  ALKPHOS 85  BILITOT 1.7*  PROT 6.8  ALBUMIN 3.6    Recent Labs Lab 06/22/15 0455  LIPASE 43   No results for input(s): AMMONIA in the last 168 hours. CBC:  Recent Labs Lab 06/22/15 0455  WBC 10.6*  HGB 8.5*  HCT 26.3*  MCV 83.5  PLT 338   Cardiac Enzymes: No results for input(s): CKTOTAL, CKMB, CKMBINDEX, TROPONINI in the last 168 hours.  BNP (last 3 results) No results for input(s): BNP in the last 8760 hours.  ProBNP (last 3 results) No results  for input(s): PROBNP in the last 8760 hours.  CBG:  Recent Labs Lab 06/20/15 1119 06/20/15 1801 06/22/15 0625 06/22/15 0743 06/22/15 0946  GLUCAP 230* 145* 457* 371* 228*    Radiological Exams on Admission: No results found.  EKG:   Assessment/Plan Principal Problem:   DKA (diabetic ketoacidoses) (HCC) Active Problems:   Gastroparesis due to DM (HCC)   Nausea & vomiting   CKD (chronic kidney disease) stage 3, GFR 30-59 ml/min   Anemia of chronic kidney failure   Lactic acidosis   Acute on chronic renal failure ([place])  #1. DKA. May be related to noncompliance given her chronic abdominal pain nausea vomiting.anion gap 23+keytones in urine. Afebrile -Admit to step down -Continue insulin drip per protocol -IV fluids per protocol -when gap closes and acidosis is resolved we'll transition to home regimen  #2. Intractable nausea vomiting and abdominal pain. With a history chronic gastroparesis. Chart review indicates patient allergic to Reglan -Provide scheduled Zofran -Phenergan suppositories for breakthrough -Nothing by mouth -She is on marinol at home will hold this for  now  #3. Diarrhea. 3 episodes prior to presentation. Chart review indicates antibiotics in October for UTI -IV fluids as noted above -Stool for GI pathogen panel and C. Difficile -Supportive care  #4. Acute on chronic anemia. Hemoglobin down to 8 from 11 in 4 weeks -Check an anemia panel -FOBT -No obvious signs active bleeding -Follow hemoglobin  #5. Hypertension. Home medications include no antihypertensives. She was hypotensive last hospitalization. Review indicates in the past she's been on Lasix and ACE inhibitor -Blood pressure high and of normal -When necessary hydralazine  #6. Acute on chronic kidney disease stage II. Current creatinine slightly above baseline. Hx recent bilateral hydronephrosis. Evaluated by urology who opine likely neurogenic bladder and chronic urinary retention.  -obtain bedside bladder scan evaluate for retention -hold nephrotoxins -monitor urine output -recheck in am   Code Status: full DVT Prophylaxis: Family Communication: none present Disposition Plan: home when ready  Time spent: 60 minutes  [facility]

## 2015-06-22 NOTE — ED Notes (Signed)
Brought from home via EMS for c/o nausea and vomiting.  [facility] for the same.  Has not taken insulin because of n/v.  CBG reported as 497.  Hx of gastroparesis.

## 2015-06-23 DIAGNOSIS — R112 Nausea with vomiting, unspecified: Secondary | ICD-10-CM | POA: Diagnosis not present

## 2015-06-23 DIAGNOSIS — F411 Generalized anxiety disorder: Secondary | ICD-10-CM

## 2015-06-23 DIAGNOSIS — E876 Hypokalemia: Secondary | ICD-10-CM

## 2015-06-23 DIAGNOSIS — R197 Diarrhea, unspecified: Secondary | ICD-10-CM

## 2015-06-23 DIAGNOSIS — D649 Anemia, unspecified: Secondary | ICD-10-CM | POA: Diagnosis not present

## 2015-06-23 DIAGNOSIS — I10 Essential (primary) hypertension: Secondary | ICD-10-CM | POA: Diagnosis not present

## 2015-06-23 DIAGNOSIS — E0842 Diabetes mellitus due to underlying condition with diabetic polyneuropathy: Secondary | ICD-10-CM | POA: Diagnosis not present

## 2015-06-23 DIAGNOSIS — E1010 Type 1 diabetes mellitus with ketoacidosis without coma: Secondary | ICD-10-CM | POA: Diagnosis not present

## 2015-06-23 DIAGNOSIS — E1143 Type 2 diabetes mellitus with diabetic autonomic (poly)neuropathy: Secondary | ICD-10-CM | POA: Diagnosis not present

## 2015-06-23 DIAGNOSIS — I272 Other secondary pulmonary hypertension: Secondary | ICD-10-CM

## 2015-06-23 LAB — BASIC METABOLIC PANEL
ANION GAP: 9 (ref 5–15)
ANION GAP: 11 (ref 5–15)
ANION GAP: 10 (ref 5–15)
Anion gap: 14 (ref 5–15)
Anion gap: 9 (ref 5–15)
BUN: 8 mg/dL (ref 6–20)
BUN: 9 mg/dL (ref 6–20)
BUN: 6 mg/dL (ref 6–20)
BUN: 11 mg/dL (ref 6–20)
BUN: <5 mg/dL — ABNORMAL LOW (ref 6–20)
CALCIUM: 8.7 mg/dL — AB (ref 8.9–10.3)
CALCIUM: 8.7 mg/dL — AB (ref 8.9–10.3)
CALCIUM: 8.8 mg/dL — AB (ref 8.9–10.3)
CHLORIDE: 113 mmol/L — AB (ref 101–111)
CO2: 20 mmol/L — ABNORMAL LOW (ref 22–32)
CO2: 14 mmol/L — AB (ref 22–32)
CO2: 19 mmol/L — ABNORMAL LOW (ref 22–32)
CO2: 20 mmol/L — AB (ref 22–32)
CO2: 21 mmol/L — ABNORMAL LOW (ref 22–32)
CREATININE: 1.15 mg/dL — AB (ref 0.44–1.00)
CREATININE: 1.01 mg/dL — AB (ref 0.44–1.00)
Calcium: 8.8 mg/dL — ABNORMAL LOW (ref 8.9–10.3)
Calcium: 8.6 mg/dL — ABNORMAL LOW (ref 8.9–10.3)
Chloride: 107 mmol/L (ref 101–111)
Chloride: 110 mmol/L (ref 101–111)
Chloride: 110 mmol/L (ref 101–111)
Chloride: 113 mmol/L — ABNORMAL HIGH (ref 101–111)
Creatinine, Ser: 1.02 mg/dL — ABNORMAL HIGH (ref 0.44–1.00)
Creatinine, Ser: 0.86 mg/dL (ref 0.44–1.00)
Creatinine, Ser: 0.83 mg/dL (ref 0.44–1.00)
GFR calc Af Amer: >60 mL/min (ref >60)
GFR calc Af Amer: >60 mL/min (ref >60)
GFR calc Af Amer: >60 mL/min (ref >60)
GFR calc non Af Amer: 55 mL/min — ABNORMAL LOW (ref >60)
GFR calc non Af Amer: >60 mL/min (ref >60)
GLUCOSE: 127 mg/dL — AB (ref 65–99)
GLUCOSE: 272 mg/dL — AB (ref 65–99)
GLUCOSE: 125 mg/dL — AB (ref 65–99)
Glucose, Bld: 119 mg/dL — ABNORMAL HIGH (ref 65–99)
Glucose, Bld: 69 mg/dL (ref 65–99)
POTASSIUM: 3 mmol/L — AB (ref 3.5–5.1)
POTASSIUM: 3.2 mmol/L — AB (ref 3.5–5.1)
Potassium: 2.9 mmol/L — ABNORMAL LOW (ref 3.5–5.1)
Potassium: 3 mmol/L — ABNORMAL LOW (ref 3.5–5.1)
Potassium: 3.3 mmol/L — ABNORMAL LOW (ref 3.5–5.1)
SODIUM: 140 mmol/L (ref 135–145)
SODIUM: 138 mmol/L (ref 135–145)
Sodium: 138 mmol/L (ref 135–145)
Sodium: 142 mmol/L (ref 135–145)
Sodium: 142 mmol/L (ref 135–145)

## 2015-06-23 LAB — GLUCOSE, CAPILLARY
GLUCOSE-CAPILLARY: 152 mg/dL — AB (ref 65–99)
GLUCOSE-CAPILLARY: 256 mg/dL — AB (ref 65–99)
GLUCOSE-CAPILLARY: 289 mg/dL — AB (ref 65–99)
GLUCOSE-CAPILLARY: 140 mg/dL — AB (ref 65–99)
GLUCOSE-CAPILLARY: 142 mg/dL — AB (ref 65–99)
GLUCOSE-CAPILLARY: 118 mg/dL — AB (ref 65–99)
GLUCOSE-CAPILLARY: 116 mg/dL — AB (ref 65–99)
GLUCOSE-CAPILLARY: 55 mg/dL — AB (ref 65–99)
GLUCOSE-CAPILLARY: 132 mg/dL — AB (ref 65–99)
Glucose-Capillary: 208 mg/dL — ABNORMAL HIGH (ref 65–99)
Glucose-Capillary: 226 mg/dL — ABNORMAL HIGH (ref 65–99)
Glucose-Capillary: 144 mg/dL — ABNORMAL HIGH (ref 65–99)
Glucose-Capillary: 157 mg/dL — ABNORMAL HIGH (ref 65–99)
Glucose-Capillary: 57 mg/dL — ABNORMAL LOW (ref 65–99)
Glucose-Capillary: 117 mg/dL — ABNORMAL HIGH (ref 65–99)
Glucose-Capillary: 72 mg/dL (ref 65–99)
Glucose-Capillary: 90 mg/dL (ref 65–99)

## 2015-06-23 LAB — GASTROINTESTINAL PANEL BY PCR, STOOL (REPLACES STOOL CULTURE)
ADENOVIRUS F40/41: NOT DETECTED
ASTROVIRUS: NOT DETECTED
CYCLOSPORA CAYETANENSIS: NOT DETECTED
Campylobacter species: NOT DETECTED
Cryptosporidium: NOT DETECTED
E. coli O157: NOT DETECTED
ENTAMOEBA HISTOLYTICA: NOT DETECTED
ENTEROAGGREGATIVE E COLI (EAEC): NOT DETECTED
ENTEROPATHOGENIC E COLI (EPEC): NOT DETECTED
ENTEROTOXIGENIC E COLI (ETEC): NOT DETECTED
GIARDIA LAMBLIA: NOT DETECTED
Norovirus GI/GII: NOT DETECTED
Plesimonas shigelloides: NOT DETECTED
Rotavirus A: NOT DETECTED
SHIGA LIKE TOXIN PRODUCING E COLI (STEC): NOT DETECTED
Salmonella species: NOT DETECTED
Sapovirus (I, II, IV, and V): NOT DETECTED
Shigella/Enteroinvasive E coli (EIEC): NOT DETECTED
VIBRIO CHOLERAE: NOT DETECTED
VIBRIO SPECIES: NOT DETECTED
Yersinia enterocolitica: NOT DETECTED

## 2015-06-23 LAB — CBC
HCT: 25.3 % — ABNORMAL LOW (ref 36.0–46.0)
Hemoglobin: 8 g/dL — ABNORMAL LOW (ref 12.0–15.0)
MCH: 26.5 pg (ref 26.0–34.0)
MCHC: 31.6 g/dL (ref 30.0–36.0)
MCV: 83.8 fL (ref 78.0–100.0)
Platelets: 415 10*3/uL — ABNORMAL HIGH (ref 150–400)
RBC: 3.02 MIL/uL — ABNORMAL LOW (ref 3.87–5.11)
RDW: 16.6 % — AB (ref 11.5–15.5)
WBC: 9.4 10*3/uL (ref 4.0–10.5)

## 2015-06-23 LAB — MAGNESIUM: MAGNESIUM: 1.5 mg/dL — AB (ref 1.7–2.4)

## 2015-06-23 MED ORDER — FLUOXETINE HCL 20 MG PO CAPS
40.0000 mg | ORAL_CAPSULE | Freq: Every day | ORAL | Status: DC
  Administered 2015-06-23 – 2015-06-25 (×3): 40 mg via ORAL
  Filled 2015-06-23 (×3): qty 2

## 2015-06-23 MED ORDER — MAGNESIUM SULFATE 50 % IJ SOLN
3.0000 g | Freq: Once | INTRAVENOUS | Status: AC
  Administered 2015-06-23: 3 g via INTRAVENOUS
  Filled 2015-06-23: qty 6

## 2015-06-23 MED ORDER — INSULIN ASPART 100 UNIT/ML ~~LOC~~ SOLN
0.0000 [IU] | SUBCUTANEOUS | Status: DC
  Administered 2015-06-24: 5 [IU] via SUBCUTANEOUS
  Administered 2015-06-24: 1 [IU] via SUBCUTANEOUS
  Administered 2015-06-24: 5 [IU] via SUBCUTANEOUS
  Administered 2015-06-24: 3 [IU] via SUBCUTANEOUS
  Administered 2015-06-25: 2 [IU] via SUBCUTANEOUS
  Administered 2015-06-25 (×2): 3 [IU] via SUBCUTANEOUS

## 2015-06-23 MED ORDER — DEXTROSE 50 % IV SOLN
INTRAVENOUS | Status: AC
  Administered 2015-06-23: 17 mL
  Filled 2015-06-23: qty 50

## 2015-06-23 MED ORDER — METOCLOPRAMIDE HCL 5 MG/ML IJ SOLN
5.0000 mg | Freq: Three times a day (TID) | INTRAMUSCULAR | Status: DC
  Administered 2015-06-24: 5 mg via INTRAVENOUS
  Filled 2015-06-23 (×3): qty 2

## 2015-06-23 MED ORDER — DEXTROSE 50 % IV SOLN
INTRAVENOUS | Status: AC
  Administered 2015-06-23: 50 mL
  Filled 2015-06-23: qty 50

## 2015-06-23 MED ORDER — POTASSIUM CHLORIDE 10 MEQ/100ML IV SOLN
10.0000 meq | INTRAVENOUS | Status: AC
  Administered 2015-06-23 (×4): 10 meq via INTRAVENOUS
  Filled 2015-06-23 (×4): qty 100

## 2015-06-23 MED ORDER — LISINOPRIL 5 MG PO TABS
5.0000 mg | ORAL_TABLET | Freq: Every day | ORAL | Status: DC
  Administered 2015-06-23 – 2015-06-25 (×3): 5 mg via ORAL
  Filled 2015-06-23 (×3): qty 1

## 2015-06-23 MED ORDER — INSULIN DETEMIR 100 UNIT/ML ~~LOC~~ SOLN
10.0000 [IU] | Freq: Every day | SUBCUTANEOUS | Status: DC
  Administered 2015-06-23 – 2015-06-24 (×2): 10 [IU] via SUBCUTANEOUS
  Filled 2015-06-23 (×2): qty 0.1

## 2015-06-23 MED ORDER — POTASSIUM CHLORIDE 10 MEQ/100ML IV SOLN
10.0000 meq | Freq: Once | INTRAVENOUS | Status: AC
  Administered 2015-06-23: 10 meq via INTRAVENOUS
  Filled 2015-06-23: qty 100

## 2015-06-23 NOTE — Progress Notes (Signed)
[place] TEAM 1 - Stepdown/ICU TEAM Progress Note  [name] [id number] DOB: Jun 01, 1966 DOA: 06/22/2015 PCP: Barbette Merino, MD  Admit HPI / Brief Narrative: 49 y.o. BF PMHx Anxiety, DM Type 1 Uncontrolled w/ Gastroparesis and Neuropathy, HTN,   Presents to the emergency department with the chief complaint of intractable nausea vomiting, abdominal pain and diarrhea. Initial evaluation reveals DKA with an anion gap of 23, bicarb of 11 serum glucose 485.  Patient reports having been discharged 2 days ago after a six-day stay for the same. He reports she had one good day and then the nausea came back. Associated symptoms include abdominal pain vomiting and diarrhea with generalized weakness.. She denies any fever chills headache coffee ground emesis. He reports 3 episodes of loose stool denies bright red blood per rectum or melena. She reports waiting for insurance to come through and surgery is planned at [facility] for her chronic gastroparesis.  In the ED she is afebrile no dynamically stable with mild tachycardia and not hypoxic. In sun drip is initiated   HPI/Subjective: 12/29 A/O x 4 Diaphoretic, Positive nausea, Negative Vomiting, Negative Abd Pain, Negative CP, Positive Bilat LE pain    Assessment/Plan:   DM Type 1 Uncontrolled w/ Gastroparesis and Neuropathy/ DKA. May be related to noncompliance given her chronic abdominal pain nausea vomiting.anion gap 23+keytones in urine. Afebrile -Continue insulin drip per protocol -IV fluids per protocol -when gap closes and acidosis is resolved we'll transition to home regimen -Reglan 5mg  TID (States can give her slurred speech) Pt willing to start slow -Lyrica 150 mg BID  Intractable nausea vomiting and abdominal pain.  -Consult to DM Nutritionist; and DM Coordinator has not seen - Does not eat DM diet  - PT Stated NOT ALLERGIC REGLAN; causes slurred speech (SIDE EFFECT) will monitor closely  -Provide scheduled  Zofran -Phenergan suppositories for breakthrough -NPO  -She is on marinol at home will hold this for now  Diarrhea.  -3 episodes prior to presentation. Chart review indicates antibiotics in October for UTI -IV fluids as noted above -Stool for GI pathogen panel and C. Difficile -Supportive care -Will start Reglan when diarrhea subsides  Acute on chronic anemia.  -Hemoglobin down to 8 from 11 in 4 weeks -Check an anemia panel -FOBT Positive; consult GI in A.m. -Follow hemoglobin  Hypertension.  -Start lisinopril 5 mg daily  Pulmonary HTN  -See HTN  Hypokalemia -potassium IV 50 meq -recheck K/Mg @ 1600  Hypomagnesemia -Magnesium IV 3 gm  Anxiety -Prozac 40 mg daily -Xanax 0.25 mg TID PRN   Code Status: FULL Family Communication: no family present at time of exam Disposition Plan: Resolution DKA    Consultants:   Procedure/Significant Events: 11/23 echocardiogram;- LVEF= 55%-60%.- Pulmonary arteries: PA peak pressure: 40 mm Hg (S).   Culture   Antibiotics:   DVT prophylaxis:  Subcutaneous heparin  Devices    LINES / TUBES:      Continuous Infusions: . sodium chloride 75 mL/hr at 06/22/15 1108  . dextrose 5 % and 0.45% NaCl 50 mL/hr at 06/23/15 0200  . insulin (NOVOLIN-R) infusion Stopped (06/23/15 0929)    Objective: VITAL SIGNS: Temp: 98.6 F (37 C) (12/29 2023) Temp Source: Oral (12/29 2023) BP: 156/75 mmHg (12/29 1900) Pulse Rate: 98 (12/29 1900) SPO2; FIO2:   Intake/Output Summary (Last 24 hours) at 06/23/15 2035 Last data filed at 06/23/15 1800  Gross per 24 hour  Intake 1225.26 ml  Output    500 ml  Net 725.26 ml  Exam: General: A/O 4, anxious, negative CP, No acute respiratory distress Eyes: Negative headache, negative scleral hemorrhage ENT: Negative Runny nose, negative ear pain, negative gingival bleeding, Neck:  Negative scars, masses, torticollis, lymphadenopathy, JVD Lungs: Clear to auscultation bilaterally  without wheezes or crackles Cardiovascular: Regular rate and rhythm without murmur gallop or rub normal S1 and S2 Abdomen:negative abdominal pain, nondistended, positive soft, bowel sounds, no rebound, no ascites, no appreciable mass Extremities: No significant cyanosis, clubbing, or edema bilateral lower extremities Psychiatric:  Negative depression, negative anxiety, negative fatigue, negative mania  Neurologic:  Cranial nerves II through XII intact, tongue/uvula midline, all extremities muscle strength 5/5, sensation intact throughout, negative dysarthria, negative expressive aphasia, negative receptive aphasia.   Data Reviewed: Basic Metabolic Panel:  Recent Labs Lab 06/22/15 1908 06/23/15 06/23/15 0235 06/23/15 1040 06/23/15 1535  NA 141 138 142  142 140 138  K 3.4* 3.3* 3.0*  3.0* 2.9* 3.2*  CL 112* 110 113*  113* 110 107  CO2 18* 14* 20*  20* 19* 21*  GLUCOSE 155* 272* 119*  125* 127* 69  BUN 11 11 9  8 6  <5*  CREATININE 1.05* 1.15* 1.02*  1.01* 0.86 0.83  CALCIUM 8.8* 8.7* 8.8*  8.8* 8.7* 8.6*  MG  --   --   --   --  1.5*   Liver Function Tests:  Recent Labs Lab 06/22/15 0455  AST 30  ALT 16  ALKPHOS 85  BILITOT 1.7*  PROT 6.8  ALBUMIN 3.6    Recent Labs Lab 06/22/15 0455  LIPASE 43   No results for input(s): AMMONIA in the last 168 hours. CBC:  Recent Labs Lab 06/22/15 0455 06/23/15  WBC 10.6* 9.4  HGB 8.5* 8.0*  HCT 26.3* 25.3*  MCV 83.5 83.8  PLT 338 415*   Cardiac Enzymes: No results for input(s): CKTOTAL, CKMB, CKMBINDEX, TROPONINI in the last 168 hours. BNP (last 3 results) No results for input(s): BNP in the last 8760 hours.  ProBNP (last 3 results) No results for input(s): PROBNP in the last 8760 hours.  CBG:  Recent Labs Lab 06/23/15 0929 06/23/15 1004 06/23/15 1041 06/23/15 1715 06/23/15 1803  GLUCAP 90 117* 116* 55* 132*    Recent Results (from the past 240 hour(s))  MRSA PCR Screening     Status: None    Collection Time: 06/14/15  7:16 PM  Result Value Ref Range Status   MRSA by PCR NEGATIVE NEGATIVE Final    Comment:        The GeneXpert MRSA Assay (FDA approved for NASAL specimens only), is one component of a comprehensive MRSA colonization surveillance program. It is not intended to diagnose MRSA infection nor to guide or monitor treatment for MRSA infections.   C difficile quick scan w PCR reflex     Status: None   Collection Time: 06/22/15 10:58 AM  Result Value Ref Range Status   C Diff antigen NEGATIVE NEGATIVE Final   C Diff toxin NEGATIVE NEGATIVE Final   C Diff interpretation Negative for toxigenic C. difficile  Final  Gastrointestinal Panel by PCR , Stool     Status: None   Collection Time: 06/22/15 10:58 AM  Result Value Ref Range Status   Campylobacter species NOT DETECTED NOT DETECTED Final   Plesimonas shigelloides NOT DETECTED NOT DETECTED Final   Salmonella species NOT DETECTED NOT DETECTED Final   Yersinia enterocolitica NOT DETECTED NOT DETECTED Final   Vibrio species NOT DETECTED NOT DETECTED Final   Vibrio cholerae NOT  DETECTED NOT DETECTED Final   Enteroaggregative E coli (EAEC) NOT DETECTED NOT DETECTED Final   Enteropathogenic E coli (EPEC) NOT DETECTED NOT DETECTED Final   Enterotoxigenic E coli (ETEC) NOT DETECTED NOT DETECTED Final   Shiga like toxin producing E coli (STEC) NOT DETECTED NOT DETECTED Final   E. coli O157 NOT DETECTED NOT DETECTED Final   Shigella/Enteroinvasive E coli (EIEC) NOT DETECTED NOT DETECTED Final   Cryptosporidium NOT DETECTED NOT DETECTED Final   Cyclospora cayetanensis NOT DETECTED NOT DETECTED Final   Entamoeba histolytica NOT DETECTED NOT DETECTED Final   Giardia lamblia NOT DETECTED NOT DETECTED Final   Adenovirus F40/41 NOT DETECTED NOT DETECTED Final   Astrovirus NOT DETECTED NOT DETECTED Final   Norovirus GI/GII NOT DETECTED NOT DETECTED Final   Rotavirus A NOT DETECTED NOT DETECTED Final   Sapovirus (I, II, IV,  and V) NOT DETECTED NOT DETECTED Final     Studies:  Recent x-ray studies have been reviewed in detail by the Attending Physician  Scheduled Meds:  Scheduled Meds: . FLUoxetine  40 mg Oral Daily  . heparin  5,000 Units Subcutaneous 3 times per day  . insulin aspart  0-9 Units Subcutaneous 6 times per day  . insulin detemir  10 Units Subcutaneous Daily  . lisinopril  5 mg Oral Daily  . magnesium sulfate 1 - 4 g bolus IVPB  3 g Intravenous Once  . metoCLOPramide (REGLAN) injection  5 mg Intravenous 3 times per day  . ondansetron (ZOFRAN) IV  4 mg Intravenous Q4H  . pantoprazole (PROTONIX) IV  40 mg Intravenous Q24H    Time spent on care of this patient: 40 mins   Crystina Borrayo, Geraldo Docker , MD  [facility] Office  [phone] Pager - [phone]  On-Call/Text Page:      [email or web address]      password [email or web address]  If 7PM-7AM, please contact night-coverage [email or web address] Password [email or web address] 06/23/2015, 8:35 PM   LOS: 1 day   Care during the described time interval was provided by me .  I have reviewed this patient's available data, including medical history, events of note, physical examination, and all test results as part of my evaluation. I have personally reviewed and interpreted all radiology studies.   Dia Crawford, MD [phone] Pager

## 2015-06-23 NOTE — Progress Notes (Addendum)
Inpatient Diabetes Program Recommendations  AACE/ADA: New Consensus Statement on Inpatient Glycemic Control (2015)  Target Ranges:  Prepandial:   less than 140 mg/dL      Peak postprandial:   less than 180 mg/dL (1-2 hours)      Critically ill patients:  140 - 180 mg/dL   Review of Glycemic Control   Results for TEKELA, GARGUILO (MRN [id number]) as of 06/23/2015 17:03  Ref. Range 06/23/2015 05:01 06/23/2015 06:03 06/23/2015 07:05 06/23/2015 08:33 06/23/2015 10:41  Glucose-Capillary Latest Ref Range: 65-99 mg/dL 144 (H) 157 (H) 142 (H) 118 (H) 116 (H)  Results for ATIYANA, WELTE (MRN [id number]) as of 06/23/2015 17:03  Ref. Range 06/23/2015 15:35  Sodium Latest Ref Range: 135-145 mmol/L 138  Potassium Latest Ref Range: 3.5-5.1 mmol/L 3.2 (L)  Chloride Latest Ref Range: 101-111 mmol/L 107  CO2 Latest Ref Range: 22-32 mmol/L 21 (L)  BUN Latest Ref Range: 6-20 mg/dL <5 (L)  Creatinine Latest Ref Range: 0.44-1.00 mg/dL 0.83  Calcium Latest Ref Range: 8.9-10.3 mg/dL 8.6 (L)  EGFR (Non-African Amer.) Latest Ref Range: >60 mL/min >60  EGFR (African American) Latest Ref Range: >60 mL/min >60  Glucose Latest Ref Range: 65-99 mg/dL 69  Anion gap Latest Ref Range: 5-15  10   AG closed. Transitioned off insulin drip. Received Levemir 10 units prior to discontinuing GlucoStabilizer. Not eating. Had hypoglycemia (69) this afternoon. Presently pt is yelling out for help. Very brittle diabetes with severe gastroparesis. Does not take insulin as prescribed. Our Inpatient Diabetes Team has spoken with pt on numerous times regarding her diabetes control. See progress notes dated 12/5, 12/8, 12/9, and 12/21. Has been instructed numerous times about taking her insulin even when she has nausea and vomiting. Given Sick Day Rules.   Will follow closely. Thank you. Lorenda Peck, RD, LDN, CDE Inpatient Diabetes Coordinator [phone]

## 2015-06-23 NOTE — Progress Notes (Addendum)
Notified K Schorr, NP regarding patient's CO2 of 20 and anion gap of 9 . Orders received to administer basal insulin at 0800 and continue patient on insulin drip for a few more hours until patient has stabilized with CBG <180 consecutively. RN will continue to monitor patient closely and will call for transitional orders once goal has been met.

## 2015-06-24 DIAGNOSIS — F411 Generalized anxiety disorder: Secondary | ICD-10-CM | POA: Diagnosis not present

## 2015-06-24 DIAGNOSIS — I10 Essential (primary) hypertension: Secondary | ICD-10-CM | POA: Diagnosis not present

## 2015-06-24 DIAGNOSIS — E1010 Type 1 diabetes mellitus with ketoacidosis without coma: Secondary | ICD-10-CM | POA: Diagnosis not present

## 2015-06-24 DIAGNOSIS — D649 Anemia, unspecified: Secondary | ICD-10-CM | POA: Diagnosis not present

## 2015-06-24 LAB — CBC WITH DIFFERENTIAL/PLATELET
BASOS PCT: 0 %
Basophils Absolute: 0 10*3/uL (ref 0.0–0.1)
Eosinophils Absolute: 0.1 10*3/uL (ref 0.0–0.7)
Eosinophils Relative: 2 %
HEMATOCRIT: 28.9 % — AB (ref 36.0–46.0)
HEMOGLOBIN: 9.5 g/dL — AB (ref 12.0–15.0)
LYMPHS PCT: 26 %
Lymphs Abs: 1.3 10*3/uL (ref 0.7–4.0)
MCH: 26.9 pg (ref 26.0–34.0)
MCHC: 32.9 g/dL (ref 30.0–36.0)
MCV: 81.9 fL (ref 78.0–100.0)
MONO ABS: 0.7 10*3/uL (ref 0.1–1.0)
MONOS PCT: 14 %
NEUTROS ABS: 2.8 10*3/uL (ref 1.7–7.7)
NEUTROS PCT: 58 %
Platelets: 369 10*3/uL (ref 150–400)
RBC: 3.53 MIL/uL — ABNORMAL LOW (ref 3.87–5.11)
RDW: 16.5 % — AB (ref 11.5–15.5)
WBC: 4.9 10*3/uL (ref 4.0–10.5)

## 2015-06-24 LAB — GLUCOSE, CAPILLARY
GLUCOSE-CAPILLARY: 106 mg/dL — AB (ref 65–99)
GLUCOSE-CAPILLARY: 121 mg/dL — AB (ref 65–99)
GLUCOSE-CAPILLARY: 212 mg/dL — AB (ref 65–99)
GLUCOSE-CAPILLARY: 249 mg/dL — AB (ref 65–99)
GLUCOSE-CAPILLARY: 80 mg/dL (ref 65–99)
Glucose-Capillary: 138 mg/dL — ABNORMAL HIGH (ref 65–99)
Glucose-Capillary: 152 mg/dL — ABNORMAL HIGH (ref 65–99)
Glucose-Capillary: 267 mg/dL — ABNORMAL HIGH (ref 65–99)

## 2015-06-24 LAB — COMPREHENSIVE METABOLIC PANEL
ALT: 17 U/L (ref 14–54)
AST: 29 U/L (ref 15–41)
Albumin: 3 g/dL — ABNORMAL LOW (ref 3.5–5.0)
Alkaline Phosphatase: 73 U/L (ref 38–126)
Anion gap: 11 (ref 5–15)
CHLORIDE: 102 mmol/L (ref 101–111)
CO2: 20 mmol/L — ABNORMAL LOW (ref 22–32)
Calcium: 8.2 mg/dL — ABNORMAL LOW (ref 8.9–10.3)
Creatinine, Ser: 0.84 mg/dL (ref 0.44–1.00)
GFR calc Af Amer: >60 mL/min (ref >60)
Glucose, Bld: 241 mg/dL — ABNORMAL HIGH (ref 65–99)
POTASSIUM: 3.2 mmol/L — AB (ref 3.5–5.1)
Sodium: 133 mmol/L — ABNORMAL LOW (ref 135–145)
Total Bilirubin: 0.6 mg/dL (ref 0.3–1.2)
Total Protein: 5.9 g/dL — ABNORMAL LOW (ref 6.5–8.1)

## 2015-06-24 LAB — MAGNESIUM: MAGNESIUM: 2 mg/dL (ref 1.7–2.4)

## 2015-06-24 MED ORDER — INSULIN DETEMIR 100 UNIT/ML ~~LOC~~ SOLN
5.0000 [IU] | Freq: Once | SUBCUTANEOUS | Status: AC
  Administered 2015-06-24: 5 [IU] via SUBCUTANEOUS
  Filled 2015-06-24: qty 0.05

## 2015-06-24 MED ORDER — ONDANSETRON HCL 4 MG PO TABS
4.0000 mg | ORAL_TABLET | Freq: Three times a day (TID) | ORAL | Status: DC | PRN
Start: 2015-06-24 — End: 2015-06-25
  Administered 2015-06-25: 4 mg via ORAL
  Filled 2015-06-24: qty 1

## 2015-06-24 MED ORDER — INSULIN DETEMIR 100 UNIT/ML ~~LOC~~ SOLN
15.0000 [IU] | Freq: Every day | SUBCUTANEOUS | Status: DC
  Filled 2015-06-24: qty 0.15

## 2015-06-24 MED ORDER — POTASSIUM CHLORIDE CRYS ER 20 MEQ PO TBCR
50.0000 meq | EXTENDED_RELEASE_TABLET | Freq: Once | ORAL | Status: AC
  Administered 2015-06-24: 50 meq via ORAL
  Filled 2015-06-24: qty 2

## 2015-06-24 NOTE — Progress Notes (Signed)
Patient stated she needed the bedpan, BSC placed bedside the bed and encourage patient to get OOB to void. With assist patient sat on side of bed "I just don't think I can get UP" again patient assisted to [facility]. Patient steady when standing, she voided 749ml of clear yellow urine. Assisted back to bed with encouragement to stand up straight and walk to head of bed. "I don't think I can." Several steps taken to correct position and patient laid down picking up her legs and positioning herself in bed. Will continue to assist in daily functions.

## 2015-06-24 NOTE — Progress Notes (Signed)
Inpatient Diabetes Program Recommendations  AACE/ADA: New Consensus Statement on Inpatient Glycemic Control (2015)  Target Ranges:  Prepandial:   less than 140 mg/dL      Peak postprandial:   less than 180 mg/dL (1-2 hours)      Critically ill patients:  140 - 180 mg/dL   Review of Glycemic Control  Results for [name], [name] (MRN [id number]) as of 06/24/2015 16:01  Ref. Range 06/23/2015 23:54 06/24/2015 03:59 06/24/2015 07:50 06/24/2015 12:29  Glucose-Capillary Latest Ref Range: 65-99 mg/dL 121 (H) 212 (H) 267 (H) 249 (H)   Needs meal coverage and increase in basal insulin. Pt on Levemir at home.  Inpatient Diabetes Program Recommendations:  Levemir 15 units QHS Add Novolog 3 units tidwc for meal coverage insulin Change Novolog to tidwc and hs if pt is eating.  Will continue to follow. Thank you. Lorenda Peck, RD, LDN, CDE Inpatient Diabetes Coordinator [phone]

## 2015-06-24 NOTE — Progress Notes (Signed)
Foam dressing removed from patients sacrum. No skin issues noted under dressing and patient able to lay on sides while in bed. Will continue to monitor.

## 2015-06-24 NOTE — Progress Notes (Signed)
Patient assisted from [facility] to chair, covered with blankets. "I'm sleepy and want to stay in bed." Didn't allow patient to stay in bed. Got patient a cup of hot tea and she drank a little of it. "Can I have something for my nerves.?" Patient given dose of Xanax 0.25mg . Patient sitting in chair watching drug addition on TV. Will continue to monitor closely.

## 2015-06-24 NOTE — Plan of Care (Signed)
Problem: Food- and Nutrition-Related Knowledge Deficit (NB-1.1) Goal: Nutrition education Formal process to instruct or train a patient/client in a skill or to impart knowledge to help patients/clients voluntarily manage or modify food choices and eating behavior to maintain or improve health. Outcome: Adequate for Discharge  RD consulted for nutrition education regarding diabetes.     Lab Results  Component Value Date    HGBA1C 7.3* 05/18/2015   Spoke with RN prior to initiation of education session. She reports pt has made a lot of progress with much encouragement for pt to do for herself. She tolerated a soft diet for breakfast and is now sitting in the recliner.   Spoke with pt at bedside, who reports that she has always struggled with DM control, however, this has been more challenging for her within the past year, due to exacerbation of gastroparesis. She reveals that she was diagnosed with gastroparesis 3 years ago and described her blood glucose control as "good", but within the past year has struggled with making good food choices and managing her DM secondary to gastroparesis.   Pt reports she has been following a "soft diet" at home secondary to gastroparesis. Breakfast is grits and eggs, lunch is a broth based soup, and dinner is chicken legs, mashed potatoes, and green beans. Commonly consumed beverages are diet green tea and juice.   Pt admits that she does not count carbohydrates and struggles with dosing her bolus insulin (she later revealed that she is on sliding scale insulin at home). When asked if she has received education in the past about these topics, she reveals "I have, but it was a long time ago". Spent the majority of visit (25 minutes) education pt on nutrition therapy for gastroparesis (discussed small, frequent meals), sources of carbohydrates, servings sizes, and carbohydrate counting. Encouraged pt to use measuring spoons to assist her with portion sizes of  carbohydrates. Pt was able to appropriately identify sources of carbohydrate. Also spent time assisting pt with label reading.   Pt became tearful at the end of visit, stating "I miss my son; he hasn't come to visit me since I've been up here". Provided emotional support to pt. Pt shared that she lives with her son, but she manages her medications and meal preparation on her own. Pt would likely benefit from a Type 1 Diabetes Support Group, as this may empower her to be more compliant with her self-management.   RD provided "Carbohydrate Counting for People with Diabetes" and "Label Reading for Diabetes" handouts from the Academy of Nutrition and Dietetics. Discussed different food groups and their effects on blood sugar, emphasizing carbohydrate-containing foods. Provided list of carbohydrates and recommended serving sizes of common foods.  Discussed importance of controlled and consistent carbohydrate intake throughout the day. Provided examples of ways to balance meals/snacks and encouraged intake of high-fiber, whole grain complex carbohydrates. Teach back method used.  Expect fair to poor compliance.  Body mass index is 21.94 kg/(m^2). Pt meets criteria for normal weight range based on current BMI.  Current diet order is soft, patient is consuming approximately 100% of meals at this time. Labs and medications reviewed. No further nutrition interventions warranted at this time. RD contact information provided. If additional nutrition issues arise, please re-consult RD.  Taliyah Watrous A. Jimmye Norman, RD, LDN, CDE Pager: [phone] After hours Pager: [phone]

## 2015-06-24 NOTE — Progress Notes (Signed)
Pt transferred to 6e02. Pt A&O x 4, orders have been reviewed and implemented. Pt transferred to a med surg level of care. Will continue to monitor patient closely.   Shelbie Hutching, RN, BSN

## 2015-06-24 NOTE — Progress Notes (Signed)
Discussed patients role tonight about helping herself control her diabetes. She stated she has had DM since age 49. When I asked how often she checks her sugar at home she stated about 3/4 times a day. She states her blood sugars are always out of control. She stated she doesn't know how to help herself when the nausea sets in and she is unable to eat or keep anything down. Education would be her best treatment with regards to her frequent readmits. Diabetes classes and exercise would benefit her. She has this attitude of "Poor me" and she is non compliant in helping herself with the disease. Again will encourage her to get OOB in AM to set in chair for breakfast. No issues in walking are noted. Patient has not been OOB since admission on the 28th.  Will F/U with MD in AM.

## 2015-06-24 NOTE — Progress Notes (Signed)
Patient sitting straight up in bed stating "I peed in the bed." Patient never called for help. Assisted patient OOB to [facility] and she voided 515ml of clear yellow urine. "I'm so weak" patient shaking while OOB. I asked patient if she was taking any drugs we were not aware of. She had a quick response stating "I'm not a drug addict." Then she smiled. Patient assisted back to bed and she moved very slowly to lay back in bed. "Could I get my nausea medication?" Zofran 4mg  given as ordered. Patient turned on her side and said she was going to sleep some more. Patient positioned herself on her left side. Will continue to monitor.

## 2015-06-24 NOTE — Progress Notes (Signed)
[name] - Stepdown/ICU TEAM Progress Note  [name] [id number] DOB: Dec 06, 1965 DOA: 06/22/2015 PCP: Barbette Merino, MD  Admit HPI / Brief Narrative: 49 y.o. BF PMHx Anxiety, DM Type [name] Uncontrolled w/ Gastroparesis and Neuropathy, HTN,   Presents to the emergency department with the chief complaint of intractable nausea vomiting, abdominal pain and diarrhea. Initial evaluation reveals DKA with an anion gap of 23, bicarb of 11 serum glucose 485.  Patient reports having been discharged 2 days ago after a six-day stay for the same. He reports she had one good day and then the nausea came back. Associated symptoms include abdominal pain vomiting and diarrhea with generalized weakness.. She denies any fever chills headache coffee ground emesis. He reports 3 episodes of loose stool denies bright red blood per rectum or melena. She reports waiting for insurance to come through and surgery is planned at [facility] for her chronic gastroparesis.  In the ED she is afebrile no dynamically stable with mild tachycardia and not hypoxic. In sun drip is initiated   HPI/Subjective: 12/30 A/O x 4 in chair comfortably, negative nausea, Negative Vomiting, positive Abd Pain, Negative CP      Assessment/Plan:   DM Type [name] Uncontrolled w/ Gastroparesis and Neuropathy/ DKA. -Related to noncompliance given her chronic abdominal pain nausea vomiting.anion gap 23+keytones in urine. Afebrile -Off insulin drip  -Reglan PO 5mg  TID (States can give her slurred speech); start slow -Lyrica 150 mg BID -Levemir 15 units QHS (home dose); may have to decrease this patient is eating true diabetic diet (Levemir 5 units [name] today i/o to= 15 units daily ) -NovoLog moderate SSI (home dose); maintain on sensitive SSI   Intractable nausea vomiting and abdominal pain.  -Consult to DM Nutritionist; and DM Coordinator both have seen patient and provided teaching  - Spoke at length concerning eating a diabetic  diet, and consequences of continuing to eat current diet to include blindness, MI, CVA, loss of limb, death  - PT Stated NOT ALLERGIC REGLAN; causes slurred speech (SIDE EFFECT) will monitor closely; DC patient has not required  -Continue Zofran 4 mg TID PRN -Continue Phenergan 25 mg  QID PRN Breakthrough nausea -Continue to hold Marinol, patient has not required.   Diarrhea.  -3 episodes prior to presentation. Chart review indicates antibiotics in October for UTI -Stool for GI pathogen panel and C. Difficile negative   Acute on chronic anemia.  -Hemoglobin down to 8 from 11 in 4 weeks -Check an anemia panel -FOBT Positive; consult GI in A.m. -Has GI physician which she has not followed up with; Dr. Landry Mellow at [place] patient on discharge needs to schedule follow-up appointment -Follow hemoglobin  Hypertension.  -Continue lisinopril 5 mg daily  Pulmonary HTN  -See HTN  Hypokalemia -K-Dur 50 mEq  Hypomagnesemia -Resolved  Anxiety -Prozac 40 mg daily -Xanax 0.25 mg TID PRN   Code Status: FULL Family Communication: no family present at time of exam Disposition Plan: Resolution DKA    Consultants:   Procedure/Significant Events: 11/23 echocardiogram;- LVEF= 55%-60%.- Pulmonary arteries: PA peak pressure: 40 mm Hg (S).   Culture   Antibiotics:   DVT prophylaxis:  Subcutaneous heparin  Devices    LINES / TUBES:      Continuous Infusions: . sodium chloride 75 mL/hr at 06/22/15 1108  . dextrose 5 % and 0.45% NaCl 50 mL/hr at 06/24/15 0000  . insulin (NOVOLIN-R) infusion Stopped (06/23/15 0929)    Objective: VITAL SIGNS: Temp: 99 F (37.2 C) (  12/30 1232) Temp Source: Oral (12/30 1232) BP: 110/67 mmHg (12/30 1232) Pulse Rate: 97 (12/30 1232) SPO2; FIO2:   Intake/Output Summary (Last 24 hours) at 06/24/15 1456 Last data filed at 06/24/15 1454  Gross per 24 hour  Intake   2220 ml  Output   1825 ml  Net    395 ml      Exam: General: A/O 4, NAD, negative CP, No acute respiratory distress Eyes: Negative headache, negative scleral hemorrhage ENT: Negative Runny nose, negative ear pain, negative gingival bleeding, Neck:  Negative scars, masses, torticollis, lymphadenopathy, JVD Lungs: Clear to auscultation bilaterally without wheezes or crackles Cardiovascular: Regular rate and rhythm without murmur gallop or rub normal S1 and S2 Abdomen:negative abdominal pain, nondistended, positive soft, bowel sounds, no rebound, no ascites, no appreciable mass Extremities: No significant cyanosis, clubbing, or edema bilateral lower extremities Psychiatric:  Negative depression, negative anxiety, negative fatigue, negative mania  Neurologic:  Cranial nerves II through XII intact, tongue/uvula midline, all extremities muscle strength 5/5, sensation intact throughout, negative dysarthria, negative expressive aphasia, negative receptive aphasia.   Data Reviewed: Basic Metabolic Panel:  Recent Labs Lab 06/23/15 06/23/15 0235 06/23/15 1040 06/23/15 1535 06/24/15 0454  NA 138 142  142 140 138 133*  K 3.3* 3.0*  3.0* 2.9* 3.2* 3.2*  CL 110 113*  113* 110 107 102  CO2 14* 20*  20* 19* 21* 20*  GLUCOSE 272* 119*  125* 127* 69 241*  BUN 11 9  8 6  <5* <5*  CREATININE [name].15* [name].02*  [name].01* 0.86 0.83 0.84  CALCIUM 8.7* 8.8*  8.8* 8.7* 8.6* 8.2*  MG  --   --   --  [name].5* 2.0   Liver Function Tests:  Recent Labs Lab 06/22/15 0455 06/24/15 0454  AST 30 29  ALT 16 17  ALKPHOS 85 73  BILITOT [name].7* 0.6  PROT 6.8 5.9*  ALBUMIN 3.6 3.0*    Recent Labs Lab 06/22/15 0455  LIPASE 43   No results for input(s): AMMONIA in the last 168 hours. CBC:  Recent Labs Lab 06/22/15 0455 06/23/15 06/24/15 0454  WBC 10.6* 9.4 4.9  NEUTROABS  --   --  2.8  HGB 8.5* 8.0* 9.5*  HCT 26.3* 25.3* 28.9*  MCV 83.5 83.8 81.9  PLT 338 415* 369   Cardiac Enzymes: No results for input(s): CKTOTAL, CKMB, CKMBINDEX,  TROPONINI in the last 168 hours. BNP (last 3 results) No results for input(s): BNP in the last 8760 hours.  ProBNP (last 3 results) No results for input(s): PROBNP in the last 8760 hours.  CBG:  Recent Labs Lab 06/23/15 2029 06/23/15 2354 06/24/15 0359 06/24/15 0750 06/24/15 1229  GLUCAP 80 121* 212* 267* 249*    Recent Results (from the past 240 hour(s))  MRSA PCR Screening     Status: None   Collection Time: 06/14/15  7:16 PM  Result Value Ref Range Status   MRSA by PCR NEGATIVE NEGATIVE Final    Comment:        The GeneXpert MRSA Assay (FDA approved for NASAL specimens only), is one component of a comprehensive MRSA colonization surveillance program. It is not intended to diagnose MRSA infection nor to guide or monitor treatment for MRSA infections.   C difficile quick scan w PCR reflex     Status: None   Collection Time: 06/22/15 10:58 AM  Result Value Ref Range Status   C Diff antigen NEGATIVE NEGATIVE Final   C Diff toxin NEGATIVE NEGATIVE Final   C  Diff interpretation Negative for toxigenic C. difficile  Final  Gastrointestinal Panel by PCR , Stool     Status: None   Collection Time: 06/22/15 10:58 AM  Result Value Ref Range Status   Campylobacter species NOT DETECTED NOT DETECTED Final   Plesimonas shigelloides NOT DETECTED NOT DETECTED Final   Salmonella species NOT DETECTED NOT DETECTED Final   Yersinia enterocolitica NOT DETECTED NOT DETECTED Final   Vibrio species NOT DETECTED NOT DETECTED Final   Vibrio cholerae NOT DETECTED NOT DETECTED Final   Enteroaggregative E coli (EAEC) NOT DETECTED NOT DETECTED Final   Enteropathogenic E coli (EPEC) NOT DETECTED NOT DETECTED Final   Enterotoxigenic E coli (ETEC) NOT DETECTED NOT DETECTED Final   Shiga like toxin producing E coli (STEC) NOT DETECTED NOT DETECTED Final   E. coli O157 NOT DETECTED NOT DETECTED Final   Shigella/Enteroinvasive E coli (EIEC) NOT DETECTED NOT DETECTED Final   Cryptosporidium NOT  DETECTED NOT DETECTED Final   Cyclospora cayetanensis NOT DETECTED NOT DETECTED Final   Entamoeba histolytica NOT DETECTED NOT DETECTED Final   Giardia lamblia NOT DETECTED NOT DETECTED Final   Adenovirus F40/41 NOT DETECTED NOT DETECTED Final   Astrovirus NOT DETECTED NOT DETECTED Final   Norovirus GI/GII NOT DETECTED NOT DETECTED Final   Rotavirus A NOT DETECTED NOT DETECTED Final   Sapovirus (I, II, IV, and V) NOT DETECTED NOT DETECTED Final     Studies:  Recent x-ray studies have been reviewed in detail by the Attending Physician  Scheduled Meds:  Scheduled Meds: . FLUoxetine  40 mg Oral Daily  . heparin  5,000 Units Subcutaneous 3 times per day  . insulin aspart  0-9 Units Subcutaneous 6 times per day  . insulin detemir  10 Units Subcutaneous Daily  . lisinopril  5 mg Oral Daily  . metoCLOPramide (REGLAN) injection  5 mg Intravenous 3 times per day  . ondansetron (ZOFRAN) IV  4 mg Intravenous Q4H  . pantoprazole (PROTONIX) IV  40 mg Intravenous Q24H    Time spent on care of this patient: 40 mins   WOODS, Geraldo Docker , MD  [facility] Office  [phone] Pager - [phone]  On-Call/Text Page:      [email or web address]      password [email or web address]  If 7PM-7AM, please contact night-coverage [email or web address] Password [email or web address] 06/24/2015, 2:56 PM   LOS: 2 days   Care during the described time interval was provided by me .  I have reviewed this patient's available data, including medical history, events of note, physical examination, and all test results as part of my evaluation. I have personally reviewed and interpreted all radiology studies.   Dia Crawford, MD [phone] Pager

## 2015-06-24 NOTE — Progress Notes (Signed)
Dr. Sherral Hammers reviewed stool results, all came back negative.  Enteric precautions discontinued.

## 2015-06-25 DIAGNOSIS — E0842 Diabetes mellitus due to underlying condition with diabetic polyneuropathy: Secondary | ICD-10-CM | POA: Diagnosis not present

## 2015-06-25 DIAGNOSIS — E1010 Type 1 diabetes mellitus with ketoacidosis without coma: Secondary | ICD-10-CM | POA: Diagnosis not present

## 2015-06-25 DIAGNOSIS — E1143 Type 2 diabetes mellitus with diabetic autonomic (poly)neuropathy: Secondary | ICD-10-CM | POA: Diagnosis present

## 2015-06-25 DIAGNOSIS — K3184 Gastroparesis: Secondary | ICD-10-CM

## 2015-06-25 DIAGNOSIS — R112 Nausea with vomiting, unspecified: Secondary | ICD-10-CM | POA: Diagnosis not present

## 2015-06-25 LAB — COMPREHENSIVE METABOLIC PANEL
ALBUMIN: 2.7 g/dL — AB (ref 3.5–5.0)
ALT: 15 U/L (ref 14–54)
ANION GAP: 5 (ref 5–15)
AST: 25 U/L (ref 15–41)
Alkaline Phosphatase: 72 U/L (ref 38–126)
BUN: <5 mg/dL — ABNORMAL LOW (ref 6–20)
CO2: 25 mmol/L (ref 22–32)
Calcium: 8.3 mg/dL — ABNORMAL LOW (ref 8.9–10.3)
Chloride: 105 mmol/L (ref 101–111)
Creatinine, Ser: 0.8 mg/dL (ref 0.44–1.00)
GFR calc Af Amer: >60 mL/min (ref >60)
GFR calc non Af Amer: >60 mL/min (ref >60)
GLUCOSE: 123 mg/dL — AB (ref 65–99)
POTASSIUM: 3.5 mmol/L (ref 3.5–5.1)
Sodium: 135 mmol/L (ref 135–145)
Total Bilirubin: 0.5 mg/dL (ref 0.3–1.2)
Total Protein: 5.7 g/dL — ABNORMAL LOW (ref 6.5–8.1)

## 2015-06-25 LAB — CBC WITH DIFFERENTIAL/PLATELET
Basophils Absolute: 0 10*3/uL (ref 0.0–0.1)
Basophils Relative: 1 %
Eosinophils Absolute: 0.2 10*3/uL (ref 0.0–0.7)
Eosinophils Relative: 5 %
HCT: 29.4 % — ABNORMAL LOW (ref 36.0–46.0)
Hemoglobin: 9.7 g/dL — ABNORMAL LOW (ref 12.0–15.0)
Lymphocytes Relative: 34 %
Lymphs Abs: 1.2 10*3/uL (ref 0.7–4.0)
MCH: 26.8 pg (ref 26.0–34.0)
MCHC: 33 g/dL (ref 30.0–36.0)
MCV: 81.2 fL (ref 78.0–100.0)
Monocytes Absolute: 0.4 10*3/uL (ref 0.1–1.0)
Monocytes Relative: 12 %
Neutro Abs: 1.8 10*3/uL (ref 1.7–7.7)
Neutrophils Relative %: 50 %
Platelets: 305 10*3/uL (ref 150–400)
RBC: 3.62 MIL/uL — ABNORMAL LOW (ref 3.87–5.11)
RDW: 16.2 % — ABNORMAL HIGH (ref 11.5–15.5)
WBC: 3.6 10*3/uL — ABNORMAL LOW (ref 4.0–10.5)

## 2015-06-25 LAB — MAGNESIUM: MAGNESIUM: 1.8 mg/dL (ref 1.7–2.4)

## 2015-06-25 LAB — GLUCOSE, CAPILLARY
GLUCOSE-CAPILLARY: 64 mg/dL — AB (ref 65–99)
GLUCOSE-CAPILLARY: 213 mg/dL — AB (ref 65–99)
GLUCOSE-CAPILLARY: 217 mg/dL — AB (ref 65–99)
Glucose-Capillary: 88 mg/dL (ref 65–99)

## 2015-06-25 MED ORDER — INSULIN DETEMIR 100 UNIT/ML ~~LOC~~ SOLN: 15.0000 [IU] | Freq: Every day | SUBCUTANEOUS | Status: DC

## 2015-06-25 MED ORDER — LISINOPRIL 5 MG PO TABS: 5.0000 mg | ORAL_TABLET | Freq: Every day | ORAL | Status: DC

## 2015-06-25 MED ORDER — ALPRAZOLAM 0.25 MG PO TABS: 0.2500 mg | ORAL_TABLET | Freq: Every evening | ORAL | Status: DC | PRN

## 2015-06-25 MED ORDER — ATORVASTATIN CALCIUM 20 MG PO TABS: 20.0000 mg | ORAL_TABLET | Freq: Every day | ORAL | Status: DC

## 2015-06-25 MED ORDER — INSULIN ASPART 100 UNIT/ML ~~LOC~~ SOLN: 0.0000 [IU] | Freq: Three times a day (TID) | SUBCUTANEOUS | Status: DC

## 2015-06-25 MED ORDER — PANTOPRAZOLE SODIUM 40 MG PO TBEC
40.0000 mg | DELAYED_RELEASE_TABLET | Freq: Every day | ORAL | Status: DC
  Administered 2015-06-25: 40 mg via ORAL
  Filled 2015-06-25: qty 1

## 2015-06-25 NOTE — Discharge Summary (Addendum)
Physician Discharge Summary  [name] [id number] DOB: [birth year] DOA: 06/22/2015  PCP: Barbette Merino, MD  Admit date: 06/22/2015 Discharge date: 06/25/2015  Time spent: 45 minutes  Recommendations for Outpatient Follow-up:  DM Type 1 Uncontrolled w/ Gastroparesis and Neuropathy/ DKA. -Related to noncompliance. Patient counseled at length by myself, and diabetic nutritionist, and diabetic coordinator on sequela of continuing to be noncompliant to include loss of vision, kidney failure, amputation of limbs, MI, CVA, and death. -Reglan PO 5mg  TID  -Lyrica 150 mg BID -Levemir 15 units QHS  -NovoLog home dose SSI  Intractable nausea vomiting and abdominal pain.  -Consult to DM Nutritionist; and DM Coordinator both have seen patient and provided teaching  - Spoke at length concerning eating a diabetic diet, and consequences of continuing to eat current diet to include blindness, MI, CVA, loss of limb, death  -Continue Zofran 4 mg TID PRN -Continue Phenergan 25 mg QID PRN Breakthrough nausea -Continue to hold Marinol, patient has not required.   Diarrhea.  -Stool for GI pathogen panel and C. Difficile negative  -Resolved  Acute on chronic anemia.  -Hemoglobin down to 8 from 11 in 4 weeks -Check an anemia panel -FOBT Positive; consult GI in A.m. -Has GI physician which she has not followed up with; Dr. Landry Mellow at [facility] patient on discharge needs to schedule follow-up appointment -Schedule follow-up with Dr.,LAWAL GARBA in one week for monitoring of electrolytes, adjustment of diabetic medication.  Hypertension.  -Continue lisinopril 5 mg daily -PCP to adjust medication  Pulmonary HTN  -See HTN  HLD -Lipitor 20 mg daily  Hypokalemia -Resolved  Hypomagnesemia -Resolved  Anxiety -Prozac 40 mg daily -Xanax 0.25 mg TID PRN    Discharge Diagnoses:  Principal Problem:   DKA (diabetic ketoacidoses) (HCC) Active Problems:   Gastroparesis due to  DM (HCC)   Nausea & vomiting   CKD (chronic kidney disease) stage 3, GFR 30-59 ml/min   Anemia of chronic kidney failure   Lactic acidosis   Acute on chronic renal failure (HCC)   Type 1 diabetes mellitus with ketoacidosis without coma (HCC)   AKI (acute kidney injury) ([place])   Anemia of chronic disease   Nausea with vomiting   Diarrhea   Anemia, unspecified   Essential hypertension   Pulmonary hypertension ([place])   Anxiety state   Diabetic gastroparesis ([place])   Diabetic polyneuropathy associated with diabetes mellitus due to underlying condition [facility])   Discharge Condition: Stable  Diet recommendation: American diabetic Association  Filed Weights   06/22/15 0419 06/24/15 2040  Weight: 54.432 kg (120 lb) 51.71 kg (114 lb)    History of present illness:  49 y.o. BF PMHx Anxiety, DM Type 1 Uncontrolled w/ Gastroparesis and Neuropathy, HTN,   Presents to the emergency department with the chief complaint of intractable nausea vomiting, abdominal pain and diarrhea. Initial evaluation reveals DKA with an anion gap of 23, bicarb of 11 serum glucose 485.  Patient reports having been discharged 2 days ago after a six-day stay for the same. He reports she had one good day and then the nausea came back. Associated symptoms include abdominal pain vomiting and diarrhea with generalized weakness.. She denies any fever chills headache coffee ground emesis. He reports 3 episodes of loose stool denies bright red blood per rectum or melena. She reports waiting for insurance to come through and surgery is planned at [facility] for her chronic gastroparesis.  In the ED she is afebrile no dynamically stable with mild tachycardia and not  hypoxic. In sun drip is initiated During his hospitalization patient again was treated for DKA secondary to noncompliance with medication. Patient also was provided with resources in the form of diabetic coordinator, diabetic nutritionist to help her maintain  control of her diabetes.     Procedure/Significant Events: 11/23 echocardiogram;- LVEF= 55%-60%.- Pulmonary arteries: PA peak pressure: 40 mm Hg (S).     Discharge Exam: Filed Vitals:   06/24/15 1600 06/24/15 2040 06/25/15 0548 06/25/15 0900  BP: 124/67 134/63 136/58 145/65  Pulse: 96 90 92 88  Temp: 98.7 F (37.1 C) 98.3 F (36.8 C) 98.7 F (37.1 C) 98.4 F (36.9 C)  TempSrc: Oral  Oral Oral  Resp: 22 18 18 20   Height:      Weight:  51.71 kg (114 lb)    SpO2: 100% 100% 100% 100%    General: A/O 4, NAD, negative CP, No acute respiratory distress Eyes: Negative headache, negative scleral hemorrhage ENT: Negative Runny nose, negative ear pain, negative gingival bleeding, Neck: Negative scars, masses, torticollis, lymphadenopathy, JVD Lungs: Clear to auscultation bilaterally without wheezes or crackles Cardiovascular: Regular rate and rhythm without murmur gallop or rub normal S1 and S2 Abdomen:negative abdominal pain, nondistended, positive soft, bowel sounds, no rebound, no ascites, no appreciable mass   Discharge Instructions     Medication List    STOP taking these medications        dronabinol 10 MG capsule  Commonly known as:  MARINOL      TAKE these medications        ALPRAZolam 0.25 MG tablet  Commonly known as:  XANAX  Take 1 tablet (0.25 mg total) by mouth at bedtime as needed for sleep.     atorvastatin 20 MG tablet  Commonly known as:  LIPITOR  Take 1 tablet (20 mg total) by mouth daily at 6 PM.     FLUoxetine 40 MG capsule  Commonly known as:  PROZAC  Take 40 mg by mouth daily.     insulin aspart 100 UNIT/ML injection  Commonly known as:  novoLOG  Inject 0-15 Units into the skin 3 (three) times daily with meals. 2-15 units three times daily with food. Per sliding scale     insulin detemir 100 UNIT/ML injection  Commonly known as:  LEVEMIR  Inject 0.15 mLs (15 Units total) into the skin at bedtime.     lisinopril 5 MG tablet  Commonly  known as:  PRINIVIL,ZESTRIL  Take 1 tablet (5 mg total) by mouth daily.     ondansetron 4 MG disintegrating tablet  Commonly known as:  ZOFRAN ODT  Take 1 tablet (4 mg total) by mouth every 8 (eight) hours as needed for nausea.     pantoprazole 40 MG tablet  Commonly known as:  PROTONIX  Take 40 mg by mouth daily.     pregabalin 150 MG capsule  Commonly known as:  LYRICA  Take 1 capsule (150 mg total) by mouth 2 (two) times daily.     promethazine 25 MG suppository  Commonly known as:  PHENERGAN  Place 1 suppository (25 mg total) rectally every 6 (six) hours as needed for nausea or vomiting.       Allergies  Allergen Reactions  . Anesthetics, Amide Nausea And Vomiting  . Penicillins Diarrhea and Nausea And Vomiting    Has patient had a PCN reaction causing immediate rash, facial/tongue/throat swelling, SOB or lightheadedness with hypotension: No Has patient had a PCN reaction causing severe rash involving mucus membranes or  skin necrosis: No Has patient had a PCN reaction that required hospitalization No Has patient had a PCN reaction occurring within the last 10 years: Yes  If all of the above answers are "NO", then may proceed with Cephalosporin use.   . Buprenorphine Hcl Rash  . Encainide Nausea And Vomiting   Follow-up Information    Follow up with GARBA,LAWAL, MD. Schedule an appointment as soon as possible for a visit in 1 week.   Specialty:  Internal Medicine   Why:  Schedule follow-up with Dr.,LAWAL GARBA in one week for monitoring of electrolytes, adjustment of diabetic medication   Contact information:   [place] [place] [address] [phone]        The results of significant diagnostics from this hospitalization (including imaging, microbiology, ancillary and laboratory) are listed below for reference.    Significant Diagnostic Studies: Dg Abd 1 View  06/16/2015  CLINICAL DATA:  Generalized abdominal pain. History of gastroparesis. EXAM:  ABDOMEN - 1 VIEW COMPARISON:  05/31/2015 FINDINGS: Supine images of the abdomen and pelvis were obtained. Moderate stool burden throughout the abdomen and pelvis. Small amount of gas in the stomach. Limited evaluation for free air on this supine examination. Nonobstructive bowel gas pattern. No large abdominal calcifications. Cholecystectomy clips in the right abdomen. IMPRESSION: Moderate stool burden in the abdomen and pelvis. Nonobstructive bowel gas pattern. Electronically Signed   By: Markus Daft M.D.   On: 06/16/2015 13:49   Ct Abdomen Pelvis W Contrast  05/31/2015  CLINICAL DATA:  Abdominal pain.  Type 1 diabetes and gastroparesis. EXAM: CT ABDOMEN AND PELVIS WITH CONTRAST TECHNIQUE: Multidetector CT imaging of the abdomen and pelvis was performed using the standard protocol following bolus administration of intravenous contrast. CONTRAST:  154mL OMNIPAQUE IOHEXOL 300 MG/ML  SOLN COMPARISON:  05/14/2015 FINDINGS: Lower chest and abdominal wall:  Anasarca. Chronic lower esophageal thickening, with small sliding hiatal hernia better seen on the previous study. Hepatobiliary: Probable steatosis.Cholecystectomy. Normal common bile duct diameter. Pancreas: Unremarkable. Spleen: Unremarkable. Adrenals/Urinary Tract: Negative adrenals. Moderate bilateral hydroureteronephrosis which is greater than previously seen. Hydronephrosis is secondary to chronic marked over distension of the urinary bladder, extending to the umbilicus. The wall is chronically circumferentially thickened without acute surrounding inflammatory change. Small volume gas in the bladder which is nonspecific. Anticipate drainage and urinalysis. Reproductive:Small enhancing uterine masses, up to 21 mm, consistent with fibroids. Negative adnexa. Stomach/Bowel: No obstruction. No inflammatory change. No gastric dilatation. Vascular/Lymphatic: No acute vascular abnormality. No mass or adenopathy. Peritoneal: No ascites or pneumoperitoneum.  Musculoskeletal: No acute abnormalities. IMPRESSION: 1. Chronically over distended bladder with progressive bilateral hydroureteronephrosis. 2. Chronic findings are stable and described above. Electronically Signed   By: Monte Fantasia M.D.   On: 05/31/2015 15:37   Ir US Guide Vasc Access Right  05/27/2015  ADDENDUM REPORT: 05/27/2015 10:51 ADDENDUM: Nesacaine was used as a local anesthetic rather than lidocaine due to an allergy. Electronically Signed   By: Markus Daft M.D.   On: 05/27/2015 10:51  05/27/2015  CLINICAL DATA:  49 year old with gastroparesis, persistent retching and poor venous access. EXAM: POWER PICC LINE PLACEMENT WITH ULTRASOUND AND FLUOROSCOPIC GUIDANCE FLUOROSCOPY TIME:  12 seconds, 2 mGy PROCEDURE: The patient was advised of the possible risks and complications and agreed to undergo the procedure. The patient was then brought to the angiographic suite for the procedure. The right arm was prepped with chlorhexidine, draped in the usual sterile fashion using maximum barrier technique (cap and mask, sterile gown, sterile gloves,  large sterile sheet, hand hygiene and cutaneous antisepsis) and infiltrated locally with 1% Lidocaine. Ultrasound demonstrated patency of the right brachial vein, and this was documented with an image. Under real-time ultrasound guidance, this vein was accessed with a 21 gauge micropuncture needle and image documentation was performed. A 0.018 wire was introduced in to the vein. Over this, a 5 [place] dual lumen power injectable PICC was advanced to the lower SVC/right atrial junction. Fluoroscopy during the procedure and fluoro spot radiograph confirms appropriate catheter position. The catheter was flushed and covered with a sterile dressing. Catheter length: 42 cm Complications: None Estimated blood loss: Minimal IMPRESSION: Successful right arm Power PICC line placement with ultrasound and fluoroscopic guidance. The catheter is ready for use. Electronically Signed:  By: Markus Daft M.D. On: 05/27/2015 08:19   Dg Chest Port 1 View  06/18/2015  CLINICAL DATA:  Adjustment of central line. EXAM: PORTABLE CHEST 1 VIEW COMPARISON:  Yesterday. FINDINGS: The right IJ catheter tip is at the cavoatrial junction. The heart size appears normal. There is no pleural effusion or edema. No airspace consolidation identified. IMPRESSION: 1. No acute cardiopulmonary abnormalities. 2. Status post right IJ catheter retraction. Tip is now at the superior cavoatrial junction. Electronically Signed   By: Kerby Moors M.D.   On: 06/18/2015 14:34   Dg Chest Port 1 View  06/17/2015  CLINICAL DATA:  Central line placement EXAM: PORTABLE CHEST 1 VIEW COMPARISON:  May 23, 2015 FINDINGS: The heart size and mediastinal contours are within normal limits. Right jugular central venous line is identified with distal tip in the right atrium. Both lungs are clear. There is no pneumothorax. The visualized skeletal structures are unremarkable. IMPRESSION: No active cardial disease. Right jugular central venous line with distal tip in the right atrium. There is no pneumothorax. Electronically Signed   By: Abelardo Diesel M.D.   On: 06/17/2015 21:15   Ir Fluoro Guide Cv Midline Picc Right  05/27/2015  ADDENDUM REPORT: 05/27/2015 10:51 ADDENDUM: Nesacaine was used as a local anesthetic rather than lidocaine due to an allergy. Electronically Signed   By: Markus Daft M.D.   On: 05/27/2015 10:51  05/27/2015  CLINICAL DATA:  49 year old with gastroparesis, persistent retching and poor venous access. EXAM: POWER PICC LINE PLACEMENT WITH ULTRASOUND AND FLUOROSCOPIC GUIDANCE FLUOROSCOPY TIME:  12 seconds, 2 mGy PROCEDURE: The patient was advised of the possible risks and complications and agreed to undergo the procedure. The patient was then brought to the angiographic suite for the procedure. The right arm was prepped with chlorhexidine, draped in the usual sterile fashion using maximum barrier technique (cap  and mask, sterile gown, sterile gloves, large sterile sheet, hand hygiene and cutaneous antisepsis) and infiltrated locally with 1% Lidocaine. Ultrasound demonstrated patency of the right brachial vein, and this was documented with an image. Under real-time ultrasound guidance, this vein was accessed with a 21 gauge micropuncture needle and image documentation was performed. A 0.018 wire was introduced in to the vein. Over this, a 5 [place] dual lumen power injectable PICC was advanced to the lower SVC/right atrial junction. Fluoroscopy during the procedure and fluoro spot radiograph confirms appropriate catheter position. The catheter was flushed and covered with a sterile dressing. Catheter length: 42 cm Complications: None Estimated blood loss: Minimal IMPRESSION: Successful right arm Power PICC line placement with ultrasound and fluoroscopic guidance. The catheter is ready for use. Electronically Signed: By: Markus Daft M.D. On: 05/27/2015 08:19    Microbiology: Recent Results (from the past  240 hour(s))  C difficile quick scan w PCR reflex     Status: None   Collection Time: 06/22/15 10:58 AM  Result Value Ref Range Status   C Diff antigen NEGATIVE NEGATIVE Final   C Diff toxin NEGATIVE NEGATIVE Final   C Diff interpretation Negative for toxigenic C. difficile  Final  Gastrointestinal Panel by PCR , Stool     Status: None   Collection Time: 06/22/15 10:58 AM  Result Value Ref Range Status   Campylobacter species NOT DETECTED NOT DETECTED Final   Plesimonas shigelloides NOT DETECTED NOT DETECTED Final   Salmonella species NOT DETECTED NOT DETECTED Final   Yersinia enterocolitica NOT DETECTED NOT DETECTED Final   Vibrio species NOT DETECTED NOT DETECTED Final   Vibrio cholerae NOT DETECTED NOT DETECTED Final   Enteroaggregative E coli (EAEC) NOT DETECTED NOT DETECTED Final   Enteropathogenic E coli (EPEC) NOT DETECTED NOT DETECTED Final   Enterotoxigenic E coli (ETEC) NOT DETECTED NOT DETECTED  Final   Shiga like toxin producing E coli (STEC) NOT DETECTED NOT DETECTED Final   E. coli O157 NOT DETECTED NOT DETECTED Final   Shigella/Enteroinvasive E coli (EIEC) NOT DETECTED NOT DETECTED Final   Cryptosporidium NOT DETECTED NOT DETECTED Final   Cyclospora cayetanensis NOT DETECTED NOT DETECTED Final   Entamoeba histolytica NOT DETECTED NOT DETECTED Final   Giardia lamblia NOT DETECTED NOT DETECTED Final   Adenovirus F40/41 NOT DETECTED NOT DETECTED Final   Astrovirus NOT DETECTED NOT DETECTED Final   Norovirus GI/GII NOT DETECTED NOT DETECTED Final   Rotavirus A NOT DETECTED NOT DETECTED Final   Sapovirus (I, II, IV, and V) NOT DETECTED NOT DETECTED Final     Labs: Basic Metabolic Panel:  Recent Labs Lab 06/23/15 0235 06/23/15 1040 06/23/15 1535 06/24/15 0454 06/25/15 0538  NA 142  142 140 138 133* 135  K 3.0*  3.0* 2.9* 3.2* 3.2* 3.5  CL 113*  113* 110 107 102 105  CO2 20*  20* 19* 21* 20* 25  GLUCOSE 119*  125* 127* 69 241* 123*  BUN 9  8 6  <5* <5* <5*  CREATININE 1.02*  1.01* 0.86 0.83 0.84 0.80  CALCIUM 8.8*  8.8* 8.7* 8.6* 8.2* 8.3*  MG  --   --  1.5* 2.0 1.8   Liver Function Tests:  Recent Labs Lab 06/22/15 0455 06/24/15 0454 06/25/15 0538  AST 30 29 25   ALT 16 17 15   ALKPHOS 85 73 72  BILITOT 1.7* 0.6 0.5  PROT 6.8 5.9* 5.7*  ALBUMIN 3.6 3.0* 2.7*    Recent Labs Lab 06/22/15 0455  LIPASE 43   No results for input(s): AMMONIA in the last 168 hours. CBC:  Recent Labs Lab 06/22/15 0455 06/23/15 06/24/15 0454 06/25/15 0538  WBC 10.6* 9.4 4.9 3.6*  NEUTROABS  --   --  2.8 1.8  HGB 8.5* 8.0* 9.5* 9.7*  HCT 26.3* 25.3* 28.9* 29.4*  MCV 83.5 83.8 81.9 81.2  PLT 338 415* 369 305   Cardiac Enzymes: No results for input(s): CKTOTAL, CKMB, CKMBINDEX, TROPONINI in the last 168 hours. BNP: BNP (last 3 results) No results for input(s): BNP in the last 8760 hours.  ProBNP (last 3 results) No results for input(s): PROBNP in the last  8760 hours.  CBG:  Recent Labs Lab 06/24/15 2038 06/24/15 2353 06/25/15 0353 06/25/15 0442 06/25/15 0743  GLUCAP 106* 152* 64* 88 213*       Signed:  Dia Crawford, MD [facility] [phone] pager

## 2015-06-25 NOTE — Progress Notes (Signed)
Hypoglycemic Event  CBG: 64  Treatment: 15 GM carbohydrate snack  Symptoms: None  Follow-up CBG: [id number] CBG Result:88  Possible Reasons for Event: Unknown  Comments/MD notified: Rogue Bussing, NP on call notified     Mariely Mahr, Sherlon Handing  Remember to initiate Hypoglycemia Order Set & complete

## 2015-06-25 NOTE — Progress Notes (Signed)
Patient discharged home per MD. NSL removed with catheter intact. Telemetry discontinued. Central telemetry notified. Discharge instructions provided including prescriptions. Teach back used to validate patient understanding. Patient escorted out via wheelchair and NT. Bartholomew Crews, RN

## 2015-06-26 ENCOUNTER — Encounter (HOSPITAL_COMMUNITY): Payer: Self-pay | Admitting: Emergency Medicine

## 2015-06-26 ENCOUNTER — Emergency Department (HOSPITAL_COMMUNITY)
Admission: EM | Admit: 2015-06-26 | Discharge: 2015-06-26 | Disposition: A | Discharge status: Home / Self Care | Payer: 59 | Attending: Emergency Medicine | Admitting: Emergency Medicine

## 2015-06-26 DIAGNOSIS — Z880 Allergy status to penicillin: Secondary | ICD-10-CM | POA: Insufficient documentation

## 2015-06-26 DIAGNOSIS — Z79899 Other long term (current) drug therapy: Secondary | ICD-10-CM | POA: Insufficient documentation

## 2015-06-26 DIAGNOSIS — E119 Type 2 diabetes mellitus without complications: Secondary | ICD-10-CM | POA: Insufficient documentation

## 2015-06-26 DIAGNOSIS — Z794 Long term (current) use of insulin: Secondary | ICD-10-CM | POA: Insufficient documentation

## 2015-06-26 DIAGNOSIS — K3184 Gastroparesis: Secondary | ICD-10-CM | POA: Insufficient documentation

## 2015-06-26 DIAGNOSIS — N189 Chronic kidney disease, unspecified: Secondary | ICD-10-CM | POA: Insufficient documentation

## 2015-06-26 DIAGNOSIS — R112 Nausea with vomiting, unspecified: Secondary | ICD-10-CM

## 2015-06-26 DIAGNOSIS — F329 Major depressive disorder, single episode, unspecified: Secondary | ICD-10-CM | POA: Insufficient documentation

## 2015-06-26 DIAGNOSIS — Z862 Personal history of diseases of the blood and blood-forming organs and certain disorders involving the immune mechanism: Secondary | ICD-10-CM | POA: Insufficient documentation

## 2015-06-26 DIAGNOSIS — F419 Anxiety disorder, unspecified: Secondary | ICD-10-CM | POA: Insufficient documentation

## 2015-06-26 DIAGNOSIS — R1013 Epigastric pain: Secondary | ICD-10-CM

## 2015-06-26 LAB — CBC
HCT: 28.6 % — ABNORMAL LOW (ref 36.0–46.0)
Hemoglobin: 9 g/dL — ABNORMAL LOW (ref 12.0–15.0)
MCH: 26.4 pg (ref 26.0–34.0)
MCHC: 31.5 g/dL (ref 30.0–36.0)
MCV: 83.9 fL (ref 78.0–100.0)
PLATELETS: 365 10*3/uL (ref 150–400)
RBC: 3.41 MIL/uL — ABNORMAL LOW (ref 3.87–5.11)
RDW: 16.1 % — AB (ref 11.5–15.5)
WBC: 4.1 10*3/uL (ref 4.0–10.5)

## 2015-06-26 LAB — COMPREHENSIVE METABOLIC PANEL
ALT: 20 U/L (ref 14–54)
AST: 22 U/L (ref 15–41)
Albumin: 3.5 g/dL (ref 3.5–5.0)
Alkaline Phosphatase: 86 U/L (ref 38–126)
Anion gap: 15 (ref 5–15)
BILIRUBIN TOTAL: 1.3 mg/dL — AB (ref 0.3–1.2)
BUN: 8 mg/dL (ref 6–20)
CO2: 22 mmol/L (ref 22–32)
CREATININE: 0.8 mg/dL (ref 0.44–1.00)
Calcium: 8.8 mg/dL — ABNORMAL LOW (ref 8.9–10.3)
Chloride: 100 mmol/L — ABNORMAL LOW (ref 101–111)
GFR calc Af Amer: >60 mL/min (ref >60)
Glucose, Bld: 369 mg/dL — ABNORMAL HIGH (ref 65–99)
Potassium: 3.7 mmol/L (ref 3.5–5.1)
Sodium: 137 mmol/L (ref 135–145)
TOTAL PROTEIN: 6.8 g/dL (ref 6.5–8.1)

## 2015-06-26 LAB — URINE MICROSCOPIC-ADD ON

## 2015-06-26 LAB — CBG MONITORING, ED
GLUCOSE-CAPILLARY: 182 mg/dL — AB (ref 65–99)
Glucose-Capillary: 318 mg/dL — ABNORMAL HIGH (ref 65–99)
Glucose-Capillary: 285 mg/dL — ABNORMAL HIGH (ref 65–99)

## 2015-06-26 LAB — LIPASE, BLOOD: Lipase: 62 U/L — ABNORMAL HIGH (ref 11–51)

## 2015-06-26 LAB — RAPID URINE DRUG SCREEN, HOSP PERFORMED
Amphetamines: NOT DETECTED
BENZODIAZEPINES: NOT DETECTED
Barbiturates: NOT DETECTED
Cocaine: NOT DETECTED
Opiates: NOT DETECTED
Tetrahydrocannabinol: NOT DETECTED

## 2015-06-26 LAB — URINALYSIS, ROUTINE W REFLEX MICROSCOPIC
BILIRUBIN URINE: NEGATIVE
NITRITE: NEGATIVE
PROTEIN: 30 mg/dL — AB
Specific Gravity, Urine: 1.018 (ref 1.005–1.030)
pH: 6.5 (ref 5.0–8.0)

## 2015-06-26 MED ORDER — ONDANSETRON HCL 4 MG/2ML IJ SOLN
4.0000 mg | Freq: Once | INTRAMUSCULAR | Status: AC | PRN
Start: 2015-06-26 — End: 2015-06-26
  Administered 2015-06-26: 4 mg via INTRAVENOUS
  Filled 2015-06-26: qty 2

## 2015-06-26 MED ORDER — INSULIN ASPART 100 UNIT/ML ~~LOC~~ SOLN
1.0000 [IU] | Freq: Once | SUBCUTANEOUS | Status: AC
  Administered 2015-06-26: 1 [IU] via INTRAVENOUS
  Filled 2015-06-26: qty 1

## 2015-06-26 MED ORDER — INSULIN ASPART 100 UNIT/ML ~~LOC~~ SOLN
2.0000 [IU] | Freq: Once | SUBCUTANEOUS | Status: AC
  Administered 2015-06-26: 2 [IU] via INTRAVENOUS
  Filled 2015-06-26: qty 1

## 2015-06-26 MED ORDER — INSULIN ASPART 100 UNIT/ML ~~LOC~~ SOLN
5.0000 [IU] | Freq: Once | SUBCUTANEOUS | Status: AC
  Administered 2015-06-26: 5 [IU] via SUBCUTANEOUS
  Filled 2015-06-26: qty 1

## 2015-06-26 MED ORDER — SODIUM CHLORIDE 0.9 % IV BOLUS (SEPSIS)
1000.0000 mL | Freq: Once | INTRAVENOUS | Status: AC
  Administered 2015-06-26: 1000 mL via INTRAVENOUS

## 2015-06-26 MED ORDER — PROMETHAZINE HCL 25 MG RE SUPP: 25.0000 mg | Freq: Four times a day (QID) | RECTAL | Status: DC | PRN

## 2015-06-26 MED ORDER — KETOROLAC TROMETHAMINE 30 MG/ML IJ SOLN
30.0000 mg | Freq: Once | INTRAMUSCULAR | Status: AC
  Administered 2015-06-26: 30 mg via INTRAVENOUS
  Filled 2015-06-26: qty 1

## 2015-06-26 MED ORDER — HALOPERIDOL LACTATE 5 MG/ML IJ SOLN: 3.0000 mg | Freq: Once | INTRAMUSCULAR | Status: DC

## 2015-06-26 MED ORDER — METOCLOPRAMIDE HCL 5 MG/ML IJ SOLN
10.0000 mg | Freq: Once | INTRAMUSCULAR | Status: AC
  Administered 2015-06-26: 10 mg via INTRAVENOUS
  Filled 2015-06-26: qty 2

## 2015-06-26 MED ORDER — HALOPERIDOL LACTATE 5 MG/ML IJ SOLN
2.0000 mg | Freq: Once | INTRAMUSCULAR | Status: AC
  Administered 2015-06-26: 2 mg via INTRAVENOUS
  Filled 2015-06-26: qty 1

## 2015-06-26 MED ORDER — SODIUM CHLORIDE 0.9 % IV BOLUS (SEPSIS)
500.0000 mL | Freq: Once | INTRAVENOUS | Status: AC
  Administered 2015-06-26: 500 mL via INTRAVENOUS

## 2015-06-26 MED ORDER — DICYCLOMINE HCL 10 MG/ML IM SOLN
20.0000 mg | Freq: Once | INTRAMUSCULAR | Status: AC
  Administered 2015-06-26: 20 mg via INTRAMUSCULAR
  Filled 2015-06-26: qty 2

## 2015-06-26 NOTE — ED Notes (Addendum)
Pt states that she started having gastroparesis attack today at around 8pm. N/V. CBG 375 with EMS. Alert and oriented.

## 2015-06-26 NOTE — ED Provider Notes (Signed)
Patient signed out to me at shift change by Clayton Bibles, PA-C.  50 year old female with PMH of gastroparesis presents with epigastric abdominal pain, nausea, and vomiting, consistent with her gastroparesis, which started prior to her arrival. Of note, patient was discharged from the hospital 12/31 after being admitted for the same symptoms.   Labs significant for hyperglycemia with anion gap 15. Patient discussed with and seen by Dr. Randal Buba. Given fluids, insulin, bentyl, toradol.  Plan to recheck CBG and anticipate discharge home with PCP follow-up (Dr. Jonelle Sidle).  Repeat CBG 318, given 2 units IV insulin, with subsequent CBG 285.  Given additional 1 unit IV insulin, with repeat CBG 182.  Patient is nontoxic and reports symptom improvement. She has had no repeat episodes of vomiting in the ED. Feel she is stable for discharge at this time. Will give PR phenergan for home. Advised to follow up with PCP this week. Return precautions discussed. Patient verbalizes her understanding and is in agreement with plan.   BP 144/69 mmHg  Pulse 99  Temp(Src) 98.3 F (36.8 C) (Oral)  Resp 16  Ht 5\' 2"  (1.575 m)  Wt 54.432 kg  BMI 21.94 kg/m2  SpO2 100%  LMP 05/31/2015    Marella Chimes, PA-C 06/26/15 1023  [name], MD 06/27/15 0104

## 2015-06-26 NOTE — Discharge Instructions (Signed)
1. Medications: phenergan, usual home medications 2. Treatment: rest, drink plenty of fluids 3. Follow Up: please followup with your primary doctor this week for discussion of your diagnoses and further evaluation after today's visit; please return to the ER for high fever, severe pain, persistent vomiting, new or worsening symptoms   Abdominal Pain, Adult Many things can cause abdominal pain. Usually, abdominal pain is not caused by a disease and will improve without treatment. It can often be observed and treated at home. Your health care provider will do a physical exam and possibly order blood tests and X-rays to help determine the seriousness of your pain. However, in many cases, more time must pass before a clear cause of the pain can be found. Before that point, your health care provider may not know if you need more testing or further treatment. HOME CARE INSTRUCTIONS Monitor your abdominal pain for any changes. The following actions may help to alleviate any discomfort you are experiencing:  Only take over-the-counter or prescription medicines as directed by your health care provider.  Do not take laxatives unless directed to do so by your health care provider.  Try a clear liquid diet (broth, tea, or water) as directed by your health care provider. Slowly move to a bland diet as tolerated. SEEK MEDICAL CARE IF:  You have unexplained abdominal pain.  You have abdominal pain associated with nausea or diarrhea.  You have pain when you urinate or have a bowel movement.  You experience abdominal pain that wakes you in the night.  You have abdominal pain that is worsened or improved by eating food.  You have abdominal pain that is worsened with eating fatty foods.  You have a fever. SEEK IMMEDIATE MEDICAL CARE IF:  Your pain does not go away within 2 hours.  You keep throwing up (vomiting).  Your pain is felt only in portions of the abdomen, such as the right side or the left  lower portion of the abdomen.  You pass bloody or black tarry stools. MAKE SURE YOU:  Understand these instructions.  Will watch your condition.  Will get help right away if you are not doing well or get worse.   This information is not intended to replace advice given to you by your health care provider. Make sure you discuss any questions you have with your health care provider.   Document Released: 03/21/2005 Document Revised: 03/02/2015 Document Reviewed: 02/18/2013 Elsevier Interactive Patient Education 2016 [employer]  Gastroparesis Gastroparesis, also called delayed gastric emptying, is a condition in which food takes longer than normal to empty from the stomach. The condition is usually long-lasting (chronic). CAUSES This condition may be caused by:  An endocrine disorder, such as hypothyroidism or diabetes. Diabetes is the most common cause of this condition.  A nervous system disease, such as Parkinson disease or multiple sclerosis.  Cancer, infection, or surgery of the stomach or vagus nerve.  A connective tissue disorder, such as scleroderma.  Certain medicines. In most cases, the cause is not known. RISK FACTORS This condition is more likely to develop in:  People with certain disorders, including endocrine disorders, eating disorders, amyloidosis, and scleroderma.  People with certain diseases, including Parkinson disease or multiple sclerosis.  People with cancer or infection of the stomach or vagus nerve.  People who have had surgery on the stomach or vagus nerve.  People who take certain medicines.  Women. SYMPTOMS Symptoms of this condition include:  An early feeling of fullness when eating.  Nausea.  Weight loss.  Vomiting.  Heartburn.  Abdominal bloating.  Inconsistent blood glucose levels.  Lack of appetite.  Acid from the stomach coming up into the esophagus (gastroesophageal reflux).  Spasms of the stomach. Symptoms may  come and go. DIAGNOSIS This condition is diagnosed with tests, such as:  Tests that check how long it takes food to move through the stomach and intestines. These tests include:  Upper gastrointestinal (GI) series. In this test, X-rays of the intestines are taken after you drink a liquid. The liquid makes the intestines show up better on the X-rays.  Gastric emptying scintigraphy. In this test, scans are taken after you eat food that contains a small amount of radioactive material.  Wireless capsule GI monitoring system. This test involves swallowing a capsule that records information about movement through the stomach.  Gastric manometry. This test measures electrical and muscular activity in the stomach. It is done with a thin tube that is passed down the throat and into the stomach.  Endoscopy. This test checks for abnormalities in the lining of the stomach. It is done with a long, thin tube that is passed down the throat and into the stomach.  An ultrasound. This test can help rule out gallbladder disease or pancreatitis as a cause of your symptoms. It uses sound waves to take pictures of the inside of your body. TREATMENT There is no cure for gastroparesis. This condition may be managed with:  Treatment of the underlying condition causing the gastroparesis.  Lifestyle changes, including exercise and dietary changes. Dietary changes can include:  Changes in what and when you eat.  Eating smaller meals more often.  Eating low-fat foods.  Eating low-fiber forms of high-fiber foods, such as cooked vegetables instead of raw vegetables.  Having liquid foods in place of solid foods. Liquid foods are easier to digest.  Medicines. These may be given to control nausea and vomiting and to stimulate stomach muscles.  Getting food through a feeding tube. This may be done in severe cases.  A gastric neurostimulator. This is a device that is inserted into the body with surgery. It helps  improve stomach emptying and control nausea and vomiting. HOME CARE INSTRUCTIONS  Follow your health care provider's instructions about exercise and diet.  Take medicines only as directed by your health care provider. SEEK MEDICAL CARE IF:  Your symptoms do not improve with treatment.  You have new symptoms. SEEK IMMEDIATE MEDICAL CARE IF:  You have severe abdominal pain that does not improve with treatment.  You have nausea that does not go away.  You cannot keep fluids down.   This information is not intended to replace advice given to you by your health care provider. Make sure you discuss any questions you have with your health care provider.   Document Released: 06/11/2005 Document Revised: 10/26/2014 Document Reviewed: 06/07/2014 Elsevier Interactive Patient Education [employer].

## 2015-06-26 NOTE — ED Notes (Signed)
Per Terri, CN pt is requesting that IV team start her IV and obtain blood.

## 2015-06-26 NOTE — ED Provider Notes (Signed)
CSN: [id number]     Arrival date & time 06/26/15  0059 History   First MD Initiated Contact with Patient 06/26/15 0102     Chief Complaint  Patient presents with  . Abdominal Pain     (Consider location/radiation/quality/duration/timing/severity/associated sxs/prior Treatment) HPI   Pt with hx DM, gastroparesis, d/c from hospital today for same, presents with increased epigastric pain, n/v that began around 8pm.  States this feels like her gastroparesis.  Has tried to take PO zofran but reports she cannot keep it down.  Denies fevers, CP, SOB, hematemesis, urinary or vaginal complaints.  Had normal BM yesterday.  Has been able to pass flatus today, denies feeling bloated.   Past Medical History  Diagnosis Date  . Diabetes mellitus without complication ([place])   . Gastroparesis   . Anemia   . Intractable nausea and vomiting   . Hydronephrosis     bilateral  . CKD (chronic kidney disease)   . Anxiety and depression    Past Surgical History  Procedure Laterality Date  . Retinal detachment surgery    . Posterior vitrectomy and membrane peel-left eye  09/28/2002  . Posterior vitrectomy and membrane peel-right eye  03/16/2002  . Eye surgery    . Gailstones    . Esophagogastroduodenoscopy  09/27/2014    at [facility], Dr Rolan Lipa. biospy neg for celiac, neg for H pylori.   . Colonoscopy  09/27/2014    at [facility]   Family History  Problem Relation Age of Onset  . Cystic fibrosis Mother   . Hypertension Father   . Diabetes Brother   . Hypertension Maternal Grandmother    Social History  Substance Use Topics  . Smoking status: Never Smoker   . Smokeless tobacco: Never Used  . Alcohol Use: No   OB History    No data available     Review of Systems  All other systems reviewed and are negative.     Allergies  Anesthetics, amide; Penicillins; Buprenorphine hcl; and Encainide  Home Medications   Prior to Admission medications   Medication Sig Start Date End Date Taking?  Authorizing Provider  ALPRAZolam (XANAX) 0.25 MG tablet Take 1 tablet (0.25 mg total) by mouth at bedtime as needed for sleep. Patient taking differently: Take 0.25 mg by mouth 2 (two) times daily as needed for anxiety or sleep.  06/25/15  Yes Allie Bossier, MD  atorvastatin (LIPITOR) 20 MG tablet Take 1 tablet (20 mg total) by mouth daily at 6 PM. 06/25/15  Yes Allie Bossier, MD  dronabinol (MARINOL) 5 MG capsule Take 5 mg by mouth 2 (two) times daily. 05/16/15  Yes Historical Provider, MD  FLUoxetine (PROZAC) 20 MG capsule Take 20 mg by mouth daily. 05/13/15  Yes Historical Provider, MD  FLUoxetine (PROZAC) 40 MG capsule Take 40 mg by mouth daily.   Yes Historical Provider, MD  furosemide (LASIX) 40 MG tablet Take 40 mg by mouth 2 (two) times daily as needed for fluid. For fluid retention 03/29/15  Yes Historical Provider, MD  insulin aspart (NOVOLOG) 100 UNIT/ML injection Inject 0-15 Units into the skin 3 (three) times daily with meals. 2-15 units three times daily with food. Per sliding scale 06/25/15  Yes Allie Bossier, MD  insulin detemir (LEVEMIR) 100 UNIT/ML injection Inject 0.15 mLs (15 Units total) into the skin at bedtime. 06/25/15  Yes Allie Bossier, MD  lisinopril (PRINIVIL,ZESTRIL) 5 MG tablet Take 1 tablet (5 mg total) by mouth daily. 06/25/15  Yes  Allie Bossier, MD  ondansetron (ZOFRAN ODT) 4 MG disintegrating tablet Take 1 tablet (4 mg total) by mouth every 8 (eight) hours as needed for nausea. 06/20/15  Yes Velvet Bathe, MD  pantoprazole (PROTONIX) 40 MG tablet Take 40 mg by mouth daily.  01/04/15  Yes Historical Provider, MD  polyethylene glycol powder (GLYCOLAX/MIRALAX) powder Take 17 g by mouth daily as needed. For constipation 04/06/15  Yes Historical Provider, MD  pregabalin (LYRICA) 150 MG capsule Take 1 capsule (150 mg total) by mouth 2 (two) times daily. 02/17/13  Yes Robbie Lis, MD  promethazine (PHENERGAN) 25 MG suppository Place 1 suppository (25 mg total) rectally  every 6 (six) hours as needed for nausea or vomiting. Patient not taking: Reported on 06/26/2015 04/27/15   Samantha Tripp Dowless, PA-C   BP 171/86 mmHg  Pulse 106  Temp(Src) 98.1 F (36.7 C) (Oral)  Resp 26  Ht 5\' 2"  (1.575 m)  Wt 54.432 kg  BMI 21.94 kg/m2  SpO2 97%  LMP 05/31/2015 Physical Exam  Constitutional: She appears well-developed and well-nourished. No distress.  HENT:  Head: Normocephalic and atraumatic.  Neck: Neck supple.  Cardiovascular: Normal rate and regular rhythm.   Pulmonary/Chest: Effort normal and breath sounds normal. No respiratory distress. She has no wheezes. She has no rales.  Abdominal: Soft. She exhibits no distension. There is tenderness in the epigastric area. There is no rebound and no guarding.  Neurological: She is alert.  Skin: She is not diaphoretic.  Nursing note and vitals reviewed.   ED Course  Procedures (including critical care time) Labs Review Labs Reviewed  LIPASE, BLOOD - Abnormal; Notable for the following:    Lipase 62 (*)    All other components within normal limits  COMPREHENSIVE METABOLIC PANEL - Abnormal; Notable for the following:    Chloride 100 (*)    Glucose, Bld 369 (*)    Calcium 8.8 (*)    Total Bilirubin 1.3 (*)    All other components within normal limits  CBC - Abnormal; Notable for the following:    RBC 3.41 (*)    Hemoglobin 9.0 (*)    HCT 28.6 (*)    RDW 16.1 (*)    All other components within normal limits  URINALYSIS, ROUTINE W REFLEX MICROSCOPIC (NOT AT [facility]) - Abnormal; Notable for the following:    Color, Urine RED (*)    APPearance CLOUDY (*)    Glucose, UA >1000 (*)    Hgb urine dipstick LARGE (*)    Ketones, ur >80 (*)    Protein, ur 30 (*)    Leukocytes, UA SMALL (*)    All other components within normal limits  URINE MICROSCOPIC-ADD ON - Abnormal; Notable for the following:    Squamous Epithelial / LPF 0-5 (*)    Bacteria, UA FEW (*)    All other components within normal limits  URINE  RAPID DRUG SCREEN, HOSP PERFORMED  CBG MONITORING, ED    Imaging Review No results found. I have personally reviewed and evaluated these images and lab results as part of my medical decision-making.   EKG Interpretation   Date/Time:  Sunday June 26 2015 04:47:09 EST Ventricular Rate:  101 PR Interval:  113 QRS Duration: 95 QT Interval:  379 QTC Calculation: 491 R Axis:   -3 Text Interpretation:  Sinus tachycardia Borderline prolonged QT interval  Confirmed by [facility]  MD, APRIL ([id number]) on 06/26/2015 5:46:28 AM       5:43 AM Pt reports some  improvement in pain and nausea.  No recent vomiting.  Labs are baseline.  Discussed pt again with Dr Randal Buba.    MDM   Final diagnoses:  Gastroparesis  Epigastric pain  Non-intractable vomiting with nausea, vomiting of unspecified type   Afebrile pt with hx diabetes, gastroparesis, discharged from the hospital and returned to ED few hours later with increased epigastric pain and N/V that feels like her recurrent gastroparesis.  Labs significant for hyperglycemia (anion gap is 15).  Discussed pt multiple times with Dr Randal Buba who advised me on treatment for this patient.  Pt felt some improvement in symptoms.  Pt to have SQ insulin given, additional fluids, bentyl, toradol, cbg rechecked.  Discussed pt with Verdis Prime, PA-C, at change of shift who assumes care of this patient.  Anticipate discharge with PCP follow up, may need PR phenergan.      Clayton Bibles, PA-C 06/26/15 [id number]  [name], MD 06/26/15 [phone]

## 2015-06-26 NOTE — ED Notes (Signed)
Bed: [id number] Expected date:  Expected time:  Means of arrival:  Comments: EMS 50 yo female gastroparesis/diabetic

## 2015-06-29 ENCOUNTER — Inpatient Hospital Stay (HOSPITAL_COMMUNITY)
Admission: EM | Admit: 2015-06-29 | Discharge: 2015-07-03 | DRG: 637 | Disposition: A | Discharge status: Home / Self Care | Payer: 59 | Attending: Internal Medicine | Admitting: Internal Medicine

## 2015-06-29 ENCOUNTER — Encounter (HOSPITAL_COMMUNITY): Payer: Self-pay | Admitting: *Deleted

## 2015-06-29 DIAGNOSIS — Z794 Long term (current) use of insulin: Secondary | ICD-10-CM

## 2015-06-29 DIAGNOSIS — F418 Other specified anxiety disorders: Secondary | ICD-10-CM | POA: Diagnosis not present

## 2015-06-29 DIAGNOSIS — Z681 Body mass index (BMI) 19 or less, adult: Secondary | ICD-10-CM

## 2015-06-29 DIAGNOSIS — Z8249 Family history of ischemic heart disease and other diseases of the circulatory system: Secondary | ICD-10-CM

## 2015-06-29 DIAGNOSIS — F419 Anxiety disorder, unspecified: Secondary | ICD-10-CM | POA: Diagnosis present

## 2015-06-29 DIAGNOSIS — F32A Depression, unspecified: Secondary | ICD-10-CM | POA: Diagnosis present

## 2015-06-29 DIAGNOSIS — Z880 Allergy status to penicillin: Secondary | ICD-10-CM

## 2015-06-29 DIAGNOSIS — Z885 Allergy status to narcotic agent status: Secondary | ICD-10-CM

## 2015-06-29 DIAGNOSIS — Z888 Allergy status to other drugs, medicaments and biological substances status: Secondary | ICD-10-CM

## 2015-06-29 DIAGNOSIS — F329 Major depressive disorder, single episode, unspecified: Secondary | ICD-10-CM | POA: Diagnosis present

## 2015-06-29 DIAGNOSIS — Z884 Allergy status to anesthetic agent status: Secondary | ICD-10-CM

## 2015-06-29 DIAGNOSIS — E43 Unspecified severe protein-calorie malnutrition: Secondary | ICD-10-CM | POA: Diagnosis present

## 2015-06-29 DIAGNOSIS — E1040 Type 1 diabetes mellitus with diabetic neuropathy, unspecified: Secondary | ICD-10-CM

## 2015-06-29 DIAGNOSIS — E872 Acidosis: Secondary | ICD-10-CM | POA: Diagnosis not present

## 2015-06-29 DIAGNOSIS — D509 Iron deficiency anemia, unspecified: Secondary | ICD-10-CM | POA: Diagnosis present

## 2015-06-29 DIAGNOSIS — K3184 Gastroparesis: Secondary | ICD-10-CM | POA: Diagnosis present

## 2015-06-29 DIAGNOSIS — E1010 Type 1 diabetes mellitus with ketoacidosis without coma: Secondary | ICD-10-CM | POA: Diagnosis present

## 2015-06-29 DIAGNOSIS — Z9114 Patient's other noncompliance with medication regimen: Secondary | ICD-10-CM

## 2015-06-29 DIAGNOSIS — E1143 Type 2 diabetes mellitus with diabetic autonomic (poly)neuropathy: Secondary | ICD-10-CM | POA: Diagnosis not present

## 2015-06-29 DIAGNOSIS — E8729 Other acidosis: Secondary | ICD-10-CM | POA: Diagnosis present

## 2015-06-29 DIAGNOSIS — Z79899 Other long term (current) drug therapy: Secondary | ICD-10-CM

## 2015-06-29 DIAGNOSIS — E1165 Type 2 diabetes mellitus with hyperglycemia: Secondary | ICD-10-CM

## 2015-06-29 DIAGNOSIS — R112 Nausea with vomiting, unspecified: Secondary | ICD-10-CM | POA: Diagnosis present

## 2015-06-29 DIAGNOSIS — D631 Anemia in chronic kidney disease: Secondary | ICD-10-CM | POA: Diagnosis present

## 2015-06-29 DIAGNOSIS — Z833 Family history of diabetes mellitus: Secondary | ICD-10-CM

## 2015-06-29 DIAGNOSIS — E1022 Type 1 diabetes mellitus with diabetic chronic kidney disease: Secondary | ICD-10-CM | POA: Diagnosis present

## 2015-06-29 DIAGNOSIS — E1140 Type 2 diabetes mellitus with diabetic neuropathy, unspecified: Secondary | ICD-10-CM

## 2015-06-29 DIAGNOSIS — N183 Chronic kidney disease, stage 3 (moderate): Secondary | ICD-10-CM | POA: Diagnosis present

## 2015-06-29 LAB — URINE MICROSCOPIC-ADD ON

## 2015-06-29 LAB — BASIC METABOLIC PANEL
ANION GAP: 23 — AB (ref 5–15)
ANION GAP: 11 (ref 5–15)
BUN: 15 mg/dL (ref 6–20)
BUN: 13 mg/dL (ref 6–20)
CALCIUM: 9.2 mg/dL (ref 8.9–10.3)
CALCIUM: 8.8 mg/dL — AB (ref 8.9–10.3)
CHLORIDE: 112 mmol/L — AB (ref 101–111)
CO2: 13 mmol/L — ABNORMAL LOW (ref 22–32)
CO2: 19 mmol/L — ABNORMAL LOW (ref 22–32)
CREATININE: 0.83 mg/dL (ref 0.44–1.00)
Chloride: 98 mmol/L — ABNORMAL LOW (ref 101–111)
Creatinine, Ser: 1.18 mg/dL — ABNORMAL HIGH (ref 0.44–1.00)
GFR calc non Af Amer: >60 mL/min (ref >60)
GFR, EST NON AFRICAN AMERICAN: 54 mL/min — AB (ref >60)
Glucose, Bld: 668 mg/dL (ref 65–99)
Glucose, Bld: 156 mg/dL — ABNORMAL HIGH (ref 65–99)
Potassium: 4 mmol/L (ref 3.5–5.1)
Potassium: 3.6 mmol/L (ref 3.5–5.1)
SODIUM: 142 mmol/L (ref 135–145)
Sodium: 134 mmol/L — ABNORMAL LOW (ref 135–145)

## 2015-06-29 LAB — CBC
HEMATOCRIT: 29.8 % — AB (ref 36.0–46.0)
HEMOGLOBIN: 9.4 g/dL — AB (ref 12.0–15.0)
MCH: 26.6 pg (ref 26.0–34.0)
MCHC: 31.5 g/dL (ref 30.0–36.0)
MCV: 84.4 fL (ref 78.0–100.0)
Platelets: 459 10*3/uL — ABNORMAL HIGH (ref 150–400)
RBC: 3.53 MIL/uL — AB (ref 3.87–5.11)
RDW: 16.6 % — ABNORMAL HIGH (ref 11.5–15.5)
WBC: 8.3 10*3/uL (ref 4.0–10.5)

## 2015-06-29 LAB — URINALYSIS, ROUTINE W REFLEX MICROSCOPIC
Bilirubin Urine: NEGATIVE
Glucose, UA: >1000 mg/dL — AB
LEUKOCYTES UA: NEGATIVE
NITRITE: NEGATIVE
PH: 5.5 (ref 5.0–8.0)
Protein, ur: NEGATIVE mg/dL
SPECIFIC GRAVITY, URINE: 1.025 (ref 1.005–1.030)

## 2015-06-29 LAB — CBG MONITORING, ED
GLUCOSE-CAPILLARY: 567 mg/dL — AB (ref 65–99)
GLUCOSE-CAPILLARY: 416 mg/dL — AB (ref 65–99)
GLUCOSE-CAPILLARY: 348 mg/dL — AB (ref 65–99)
GLUCOSE-CAPILLARY: 270 mg/dL — AB (ref 65–99)

## 2015-06-29 MED ORDER — POTASSIUM CHLORIDE 10 MEQ/100ML IV SOLN: 10.0000 meq | INTRAVENOUS | Status: DC

## 2015-06-29 MED ORDER — DRONABINOL 5 MG PO CAPS
5.0000 mg | ORAL_CAPSULE | Freq: Two times a day (BID) | ORAL | Status: DC
  Administered 2015-06-29 – 2015-07-03 (×8): 5 mg via ORAL
  Filled 2015-06-29 (×8): qty 1

## 2015-06-29 MED ORDER — PROMETHAZINE HCL 25 MG/ML IJ SOLN
25.0000 mg | Freq: Once | INTRAMUSCULAR | Status: AC
  Administered 2015-06-29: 25 mg via INTRAMUSCULAR
  Filled 2015-06-29: qty 1

## 2015-06-29 MED ORDER — DEXTROSE 50 % IV SOLN: 25.0000 mL | INTRAVENOUS | Status: DC | PRN

## 2015-06-29 MED ORDER — POLYETHYLENE GLYCOL 3350 17 G PO PACK: 17.0000 g | PACK | Freq: Every day | ORAL | Status: DC | PRN

## 2015-06-29 MED ORDER — METOCLOPRAMIDE HCL 5 MG/ML IJ SOLN
10.0000 mg | Freq: Three times a day (TID) | INTRAMUSCULAR | Status: DC
  Administered 2015-06-29 – 2015-06-30 (×2): 10 mg via INTRAVENOUS
  Filled 2015-06-29 (×5): qty 2

## 2015-06-29 MED ORDER — ACETAMINOPHEN 325 MG PO TABS: 650.0000 mg | ORAL_TABLET | Freq: Four times a day (QID) | ORAL | Status: DC | PRN

## 2015-06-29 MED ORDER — DIPHENHYDRAMINE HCL 50 MG/ML IJ SOLN
25.0000 mg | Freq: Once | INTRAMUSCULAR | Status: AC
  Administered 2015-06-29: 25 mg via INTRAVENOUS
  Filled 2015-06-29: qty 1

## 2015-06-29 MED ORDER — POTASSIUM CHLORIDE 10 MEQ/100ML IV SOLN
10.0000 meq | INTRAVENOUS | Status: AC
  Administered 2015-06-29 (×2): 10 meq via INTRAVENOUS
  Filled 2015-06-29: qty 100

## 2015-06-29 MED ORDER — POLYETHYLENE GLYCOL 3350 17 GM/SCOOP PO POWD
17.0000 g | Freq: Every day | ORAL | Status: DC | PRN
  Filled 2015-06-29: qty 255

## 2015-06-29 MED ORDER — SODIUM CHLORIDE 0.9 % IV SOLN
1000.0000 mL | INTRAVENOUS | Status: DC
  Administered 2015-06-29: 1000 mL via INTRAVENOUS

## 2015-06-29 MED ORDER — METOCLOPRAMIDE HCL 5 MG/ML IJ SOLN
10.0000 mg | Freq: Once | INTRAMUSCULAR | Status: AC
  Administered 2015-06-29: 10 mg via INTRAVENOUS
  Filled 2015-06-29: qty 2

## 2015-06-29 MED ORDER — PREGABALIN 75 MG PO CAPS
150.0000 mg | ORAL_CAPSULE | Freq: Two times a day (BID) | ORAL | Status: DC
  Administered 2015-06-29 – 2015-07-03 (×8): 150 mg via ORAL
  Filled 2015-06-29 (×8): qty 2

## 2015-06-29 MED ORDER — SODIUM CHLORIDE 0.9 % IV SOLN
INTRAVENOUS | Status: DC
  Administered 2015-06-29: 2.9 [IU]/h via INTRAVENOUS
  Filled 2015-06-29: qty 2.5

## 2015-06-29 MED ORDER — DEXTROSE-NACL 5-0.45 % IV SOLN: INTRAVENOUS | Status: DC

## 2015-06-29 MED ORDER — ATORVASTATIN CALCIUM 20 MG PO TABS
20.0000 mg | ORAL_TABLET | Freq: Every day | ORAL | Status: DC
  Administered 2015-07-01 – 2015-07-02 (×2): 20 mg via ORAL
  Filled 2015-06-29 (×3): qty 1

## 2015-06-29 MED ORDER — INSULIN ASPART 100 UNIT/ML ~~LOC~~ SOLN
15.0000 [IU] | Freq: Once | SUBCUTANEOUS | Status: AC
  Administered 2015-06-29: 15 [IU] via SUBCUTANEOUS
  Filled 2015-06-29: qty 1

## 2015-06-29 MED ORDER — INSULIN REGULAR BOLUS VIA INFUSION
0.0000 [IU] | Freq: Three times a day (TID) | INTRAVENOUS | Status: DC
  Filled 2015-06-29: qty 10

## 2015-06-29 MED ORDER — SODIUM CHLORIDE 0.9 % IJ SOLN
3.0000 mL | Freq: Two times a day (BID) | INTRAMUSCULAR | Status: DC
  Administered 2015-06-29: 3 mL via INTRAVENOUS

## 2015-06-29 MED ORDER — SODIUM CHLORIDE 0.9 % IV SOLN
INTRAVENOUS | Status: AC
Start: 2015-06-29 — End: 2015-06-30
  Administered 2015-06-29: 23:00:00 via INTRAVENOUS

## 2015-06-29 MED ORDER — SODIUM CHLORIDE 0.9 % IV SOLN
1000.0000 mL | Freq: Once | INTRAVENOUS | Status: AC
  Administered 2015-06-29: 1000 mL via INTRAVENOUS

## 2015-06-29 MED ORDER — PROMETHAZINE HCL 25 MG PO TABS
12.5000 mg | ORAL_TABLET | Freq: Four times a day (QID) | ORAL | Status: DC | PRN
  Administered 2015-06-29 – 2015-06-30 (×2): 12.5 mg via ORAL
  Filled 2015-06-29 (×2): qty 1

## 2015-06-29 MED ORDER — SODIUM CHLORIDE 0.9 % IV SOLN
INTRAVENOUS | Status: DC
  Filled 2015-06-29: qty 2.5

## 2015-06-29 MED ORDER — ACETAMINOPHEN 650 MG RE SUPP: 650.0000 mg | Freq: Four times a day (QID) | RECTAL | Status: DC | PRN

## 2015-06-29 MED ORDER — FLUOXETINE HCL 20 MG PO CAPS
40.0000 mg | ORAL_CAPSULE | Freq: Every day | ORAL | Status: DC
  Administered 2015-06-29 – 2015-07-03 (×5): 40 mg via ORAL
  Filled 2015-06-29 (×5): qty 2

## 2015-06-29 MED ORDER — SODIUM CHLORIDE 0.9 % IV SOLN
INTRAVENOUS | Status: DC
  Administered 2015-06-29: 18:00:00 via INTRAVENOUS

## 2015-06-29 MED ORDER — ALPRAZOLAM 0.25 MG PO TABS
0.2500 mg | ORAL_TABLET | Freq: Two times a day (BID) | ORAL | Status: DC | PRN
  Administered 2015-06-29 – 2015-07-02 (×2): 0.25 mg via ORAL
  Filled 2015-06-29 (×2): qty 1

## 2015-06-29 MED ORDER — DEXTROSE-NACL 5-0.45 % IV SOLN
INTRAVENOUS | Status: DC
  Administered 2015-06-29 – 2015-06-30 (×2): via INTRAVENOUS

## 2015-06-29 MED ORDER — PANTOPRAZOLE SODIUM 40 MG PO TBEC
40.0000 mg | DELAYED_RELEASE_TABLET | Freq: Every day | ORAL | Status: DC
  Administered 2015-06-29 – 2015-07-03 (×5): 40 mg via ORAL
  Filled 2015-06-29 (×6): qty 1

## 2015-06-29 MED ORDER — SODIUM CHLORIDE 0.9 % IV SOLN
INTRAVENOUS | Status: DC
  Administered 2015-06-29: 20:00:00 via INTRAVENOUS

## 2015-06-29 NOTE — ED Notes (Signed)
Lab called with critical glucose of 668

## 2015-06-29 NOTE — ED Notes (Signed)
Bed: WA01 Expected date:  Expected time:  Means of arrival:  Comments: Hyperglycemia, gastroparesis

## 2015-06-29 NOTE — ED Notes (Signed)
Patient will transfer to unit after 1900 due to room change. ICU charge will notify ED charge when room is ready.

## 2015-06-29 NOTE — Progress Notes (Signed)
Pt with 14 ED visits in the last 6 months hx of gastroparesis Cm noted ED RN note stating pt did not fill last Rx for zofran  Cm spoke with pt who had run her call bell for nausea  Medication, ED Rn notified Pt confirms with CM she is still and active pt of Dr Jonelle Sidle and has recently seen him  Pt informs CM she did not fill the Zofran Rx because she already "had some at home already"  Confirms she did not have issues with the cost of med at this time  Pt during recent hospitalizations has and still refuses home health services

## 2015-06-29 NOTE — H&P (Signed)
[facility] History and Physical  [name] PQZ:[id number] DOB: 25-Dec-1965 DOA: 06/29/2015  Referring physician: ER physician: Dr. Charlesetta Shanks PCP: Barbette Merino, MD  Chief Complaint: nausea and vomiting   HPI:  50 y.o. female with past medical history of type 1 diabetes on insulin (non-compliance with insulin), multiple admission (11 admissions in 2016) for DKA and/or gastroparesis, history of diabetic neuropathy, anxiety and depression who presented to [facility] ED with diffuse abdominal pain, nausea and vomiting started about 1 day prior to this admission. No fevers or chills. No reports of blood in stool. No diarrhea or constipation. No chest pain, shortness of breath or palpitations.    In ED, BP was 140/67, HR 102, RR 22. Blood work showed hemoglobin of 9.4, creatinine 1.18, CO2 13, glucose 668, anion gap 23. UA showed ketones and glucose of more than 1000. She was started on insulin drip and admitted to SDU.  Assessment & Plan    Principal Problem:  Diabetic ketoacidosis without coma associated with type 1 diabetes mellitus (HCC) / High anion gap metabolic acidosis / Uncontrolled type 2 diabetes mellitus with diabetic neuropathy, with long-term current use of insulin (HCC) - DKA criteria were met on admission with CBG 567, glucose on BMP 668, ketone in urine, AG of 23/ Ketones were present on urinalysis - Started on insulin drip - Check BMP and CBG per DKA protocol - Continue IV fluids - Continue supportive care with antiemetics as need  - Monitor in SDU  Active Problems:  Nausea & vomiting / Diabetic gastroparesis ([place]) - Likely due to DKA - Check A1c - Treat with Reglan every 8 hours scheduled - Use phenergan as needed for intractable N/V   CKD (chronic kidney disease) stage 3, GFR 30-59 ml/min - Baseline Cr 1.4 and on this admission within baseline values   Anemia of chronic kidney failure - Hemoglobin 9.4, stable    Anxiety and depression - Continue prozac    DVT Prophylaxis  - SCD's bilaterally    Radiological Exams on Admission: No results found.   Code Status: Full Family Communication: Plan of care discussed with the patient  Disposition Plan: Admit for further evaluation, due to DKA admission to SDU  Leisa Lenz, MD  [facility] Pager [phone]  Time spent in minutes: 75 minutes  Review of Systems:  Constitutional: Negative for fever, chills and malaise/fatigue. Negative for diaphoresis.  HENT: Negative for hearing loss, ear pain, nosebleeds, congestion, sore throat, neck pain, tinnitus and ear discharge.   Eyes: Negative for blurred vision, double vision, photophobia, pain, discharge and redness.  Respiratory: Negative for cough, hemoptysis, sputum production, shortness of breath, wheezing and stridor.   Cardiovascular: Negative for chest pain, palpitations, orthopnea, claudication and leg swelling.  Gastrointestinal: per HPI Genitourinary: Negative for dysuria, urgency, frequency, hematuria and flank pain.  Musculoskeletal: Negative for myalgias, back pain, joint pain and falls.  Skin: Negative for itching and rash.  Neurological: Negative for dizziness and weakness. Negative for tingling, tremors, sensory change, speech change, focal weakness, loss of consciousness and headaches.  Endo/Heme/Allergies: Negative for environmental allergies and polydipsia. Does not bruise/bleed easily.  Psychiatric/Behavioral: Negative for suicidal ideas. The patient is not nervous/anxious.      Past Medical History  Diagnosis Date  . Diabetes mellitus without complication ([place])   . Gastroparesis   . Anemia   . Intractable nausea and vomiting   . Hydronephrosis     bilateral  . CKD (chronic kidney disease)   . Anxiety and depression  Past Surgical History  Procedure Laterality Date  . Retinal detachment surgery    . Posterior vitrectomy and membrane peel-left eye  09/28/2002  . Posterior vitrectomy and membrane peel-right  eye  03/16/2002  . Eye surgery    . Gailstones    . Esophagogastroduodenoscopy  09/27/2014    at [facility], Dr Rolan Lipa. biospy neg for celiac, neg for H pylori.   . Colonoscopy  09/27/2014    at [facility]   Social History:  reports that she has never smoked. She has never used smokeless tobacco. She reports that she does not drink alcohol or use illicit drugs.  Allergies  Allergen Reactions  . Anesthetics, Amide Nausea And Vomiting  . Penicillins Diarrhea and Nausea And Vomiting    Has patient had a PCN reaction causing immediate rash, facial/tongue/throat swelling, SOB or lightheadedness with hypotension: No Has patient had a PCN reaction causing severe rash involving mucus membranes or skin necrosis: No Has patient had a PCN reaction that required hospitalization No Has patient had a PCN reaction occurring within the last 10 years: Yes  If all of the above answers are "NO", then may proceed with Cephalosporin use.   . Buprenorphine Hcl Rash  . Encainide Nausea And Vomiting    Family History:  Family History  Problem Relation Age of Onset  . Cystic fibrosis Mother   . Hypertension Father   . Diabetes Brother   . Hypertension Maternal Grandmother      Prior to Admission medications   Medication Sig Start Date End Date Taking? Authorizing Provider  ALPRAZolam (XANAX) 0.25 MG tablet Take 1 tablet (0.25 mg total) by mouth at bedtime as needed for sleep. Patient taking differently: Take 0.25 mg by mouth 2 (two) times daily as needed for anxiety or sleep.  06/25/15  Yes Allie Bossier, MD  atorvastatin (LIPITOR) 20 MG tablet Take 1 tablet (20 mg total) by mouth daily at 6 PM. 06/25/15  Yes Allie Bossier, MD  dronabinol (MARINOL) 5 MG capsule Take 5 mg by mouth 2 (two) times daily. 05/16/15  Yes Historical Provider, MD  FLUoxetine (PROZAC) 40 MG capsule Take 40 mg by mouth daily.   Yes Historical Provider, MD  furosemide (LASIX) 40 MG tablet Take 40 mg by mouth 2 (two) times daily as  needed for fluid. For fluid retention 03/29/15  Yes Historical Provider, MD  insulin aspart (NOVOLOG) 100 UNIT/ML injection Inject 0-15 Units into the skin 3 (three) times daily with meals. 2-15 units three times daily with food. Per sliding scale 06/25/15  Yes Allie Bossier, MD  insulin detemir (LEVEMIR) 100 UNIT/ML injection Inject 0.15 mLs (15 Units total) into the skin at bedtime. 06/25/15  Yes Allie Bossier, MD  lisinopril (PRINIVIL,ZESTRIL) 5 MG tablet Take 1 tablet (5 mg total) by mouth daily. 06/25/15  Yes Allie Bossier, MD  ondansetron (ZOFRAN ODT) 4 MG disintegrating tablet Take 1 tablet (4 mg total) by mouth every 8 (eight) hours as needed for nausea. 06/20/15  Yes Velvet Bathe, MD  pantoprazole (PROTONIX) 40 MG tablet Take 40 mg by mouth daily.  01/04/15  Yes Historical Provider, MD  polyethylene glycol powder (GLYCOLAX/MIRALAX) powder Take 17 g by mouth daily as needed. For constipation 04/06/15  Yes Historical Provider, MD  pregabalin (LYRICA) 150 MG capsule Take 1 capsule (150 mg total) by mouth 2 (two) times daily. 02/17/13  Yes Robbie Lis, MD  promethazine (PHENERGAN) 25 MG suppository Place 1 suppository (25 mg total) rectally every  6 (six) hours as needed for nausea or vomiting. 06/26/15  Yes Marella Chimes, PA-C   Physical Exam: There were no vitals filed for this visit.  Physical Exam  Constitutional: Appears well-developed and well-nourished. No distress.  HENT: Normocephalic. No tonsillar erythema or exudates Eyes: Conjunctivae are normal. No scleral icterus.  Neck: Normal ROM. Neck supple. No JVD. No tracheal deviation. No thyromegaly.  CVS: RRR, S1/S2 +, no murmurs, no gallops, no carotid bruit.  Pulmonary: Effort and breath sounds normal, no stridor, rhonchi, wheezes, rales.  Abdominal: Soft. BS +,  no distension, tenderness, rebound or guarding.  Musculoskeletal: Normal range of motion. No edema and no tenderness.  Lymphadenopathy: No lymphadenopathy noted,  cervical, inguinal. Neuro: Alert. Normal reflexes, muscle tone coordination. No focal neurologic deficits. Skin: Skin is warm and dry. No rash noted.  No erythema. No pallor.  Psychiatric: Normal mood and affect. Behavior, judgment, thought content normal.   Labs on Admission:  Basic Metabolic Panel:  Recent Labs Lab 06/23/15 1535 06/24/15 0454 06/25/15 0538 06/26/15 0437 06/29/15 1519  NA 138 133* 135 137 134*  K 3.2* 3.2* 3.5 3.7 4.0  CL 107 102 105 100* 98*  CO2 21* 20* 25 22 13*  GLUCOSE 69 241* 123* 369* 668*  BUN <5* <5* <5* 8 15  CREATININE 0.83 0.84 0.80 0.80 1.18*  CALCIUM 8.6* 8.2* 8.3* 8.8* 9.2  MG 1.5* 2.0 1.8  --   --    Liver Function Tests:  Recent Labs Lab 06/24/15 0454 06/25/15 0538 06/26/15 0437  AST 29 25 22   ALT 17 15 20   ALKPHOS 73 72 86  BILITOT 0.6 0.5 1.3*  PROT 5.9* 5.7* 6.8  ALBUMIN 3.0* 2.7* 3.5    Recent Labs Lab 06/26/15 0437  LIPASE 62*   No results for input(s): AMMONIA in the last 168 hours. CBC:  Recent Labs Lab 06/23/15 06/24/15 0454 06/25/15 0538 06/26/15 0437 06/29/15 1519  WBC 9.4 4.9 3.6* 4.1 8.3  NEUTROABS  --  2.8 1.8  --   --   HGB 8.0* 9.5* 9.7* 9.0* 9.4*  HCT 25.3* 28.9* 29.4* 28.6* 29.8*  MCV 83.8 81.9 81.2 83.9 84.4  PLT 415* 369 305 365 459*   Cardiac Enzymes: No results for input(s): CKTOTAL, CKMB, CKMBINDEX, TROPONINI in the last 168 hours. BNP: Invalid input(s): POCBNP CBG:  Recent Labs Lab 06/26/15 0742 06/26/15 0849 06/29/15 1430 06/29/15 1715 06/29/15 1817  GLUCAP 285* 182* 567* 416* 348*    If 7PM-7AM, please contact night-coverage [email or web address] Password [email or web address] 06/29/2015, 7:14 PM

## 2015-06-29 NOTE — ED Notes (Signed)
Patient is from home with a complaint nausea and vomiting. Patient has history of gastroparesis. Patient's blood sugars are unstable and was reading high with EMS. Patient had stable vital signs in route. Patient was just seen on January 1st and prescribed phenergan suppositories for the gastroparesis, but did not get this prescription filled.

## 2015-06-29 NOTE — ED Provider Notes (Signed)
CSN: [id number]     Arrival date & time 06/29/15  1415 History   First MD Initiated Contact with Patient 06/29/15 1500     Chief Complaint  Patient presents with  . Hyperglycemia     (Consider location/radiation/quality/duration/timing/severity/associated sxs/prior Treatment) HPI Patient has Recurrent gastroparesis. She has had multiple presentations with similar symptoms. She reports repeated vomiting and unable to tolerate medications. She reports epigastric pain. She states this is a same as previously. No fever. Past Medical History  Diagnosis Date  . Diabetes mellitus without complication ([place])   . Gastroparesis   . Anemia   . Intractable nausea and vomiting   . Hydronephrosis     bilateral  . CKD (chronic kidney disease)   . Anxiety and depression    Past Surgical History  Procedure Laterality Date  . Retinal detachment surgery    . Posterior vitrectomy and membrane peel-left eye  09/28/2002  . Posterior vitrectomy and membrane peel-right eye  03/16/2002  . Eye surgery    . Gailstones    . Esophagogastroduodenoscopy  09/27/2014    at [facility], Dr Rolan Lipa. biospy neg for celiac, neg for H pylori.   . Colonoscopy  09/27/2014    at [facility]   Family History  Problem Relation Age of Onset  . Cystic fibrosis Mother   . Hypertension Father   . Diabetes Brother   . Hypertension Maternal Grandmother    Social History  Substance Use Topics  . Smoking status: Never Smoker   . Smokeless tobacco: Never Used  . Alcohol Use: No   OB History    No data available     Review of Systems 10 Systems reviewed and are negative for acute change except as noted in the HPI.   Allergies  Anesthetics, amide; Penicillins; Buprenorphine hcl; and Encainide  Home Medications   Prior to Admission medications   Medication Sig Start Date End Date Taking? Authorizing Provider  ALPRAZolam (XANAX) 0.25 MG tablet Take 1 tablet (0.25 mg total) by mouth at bedtime as needed for  sleep. Patient taking differently: Take 0.25 mg by mouth 2 (two) times daily as needed for anxiety or sleep.  06/25/15  Yes Allie Bossier, MD  atorvastatin (LIPITOR) 20 MG tablet Take 1 tablet (20 mg total) by mouth daily at 6 PM. 06/25/15  Yes Allie Bossier, MD  dronabinol (MARINOL) 5 MG capsule Take 5 mg by mouth 2 (two) times daily. 05/16/15  Yes Historical Provider, MD  FLUoxetine (PROZAC) 40 MG capsule Take 40 mg by mouth daily.   Yes Historical Provider, MD  furosemide (LASIX) 40 MG tablet Take 40 mg by mouth 2 (two) times daily as needed for fluid. For fluid retention 03/29/15  Yes Historical Provider, MD  insulin aspart (NOVOLOG) 100 UNIT/ML injection Inject 0-15 Units into the skin 3 (three) times daily with meals. 2-15 units three times daily with food. Per sliding scale 06/25/15  Yes Allie Bossier, MD  insulin detemir (LEVEMIR) 100 UNIT/ML injection Inject 0.15 mLs (15 Units total) into the skin at bedtime. 06/25/15  Yes Allie Bossier, MD  lisinopril (PRINIVIL,ZESTRIL) 5 MG tablet Take 1 tablet (5 mg total) by mouth daily. 06/25/15  Yes Allie Bossier, MD  ondansetron (ZOFRAN ODT) 4 MG disintegrating tablet Take 1 tablet (4 mg total) by mouth every 8 (eight) hours as needed for nausea. 06/20/15  Yes Velvet Bathe, MD  pantoprazole (PROTONIX) 40 MG tablet Take 40 mg by mouth daily.  01/04/15  Yes Historical  Provider, MD  polyethylene glycol powder (GLYCOLAX/MIRALAX) powder Take 17 g by mouth daily as needed. For constipation 04/06/15  Yes Historical Provider, MD  pregabalin (LYRICA) 150 MG capsule Take 1 capsule (150 mg total) by mouth 2 (two) times daily. 02/17/13  Yes Robbie Lis, MD  promethazine (PHENERGAN) 25 MG suppository Place 1 suppository (25 mg total) rectally every 6 (six) hours as needed for nausea or vomiting. 06/26/15  Yes Marella Chimes, PA-C   LMP 05/31/2015 Physical Exam  Constitutional: She is oriented to person, place, and time.  Patient per somewhat  deconditioned. She is nontoxic alert. No respiratory distress. She is grimacing and holding her abdomen.  HENT:  Head: Normocephalic and atraumatic.  Nose: Nose normal.  Mouth/Throat: Oropharynx is clear and moist.  Patient is making a frothy puddle of saliva that she is holding her mouth.  Eyes: EOM are normal. Pupils are equal, round, and reactive to light.  Neck: Neck supple.  Cardiovascular: Normal rate, regular rhythm, normal heart sounds and intact distal pulses.   Pulmonary/Chest: Effort normal and breath sounds normal.  Abdominal: Soft. Bowel sounds are normal. She exhibits no distension. There is tenderness.  Epigastric tenderness.  Musculoskeletal: Normal range of motion. She exhibits no edema.  Neurological: She is alert and oriented to person, place, and time. She has normal strength. Coordination normal. GCS eye subscore is 4. GCS verbal subscore is 5. GCS motor subscore is 6.  Skin: Skin is warm, dry and intact.    ED Course  Procedures (including critical care time) CRITICAL CARE Performed by: Charlesetta Shanks   Total critical care time: 30 minutes  Critical care time was exclusive of separately billable procedures and treating other patients.  Critical care was necessary to treat or prevent imminent or life-threatening deterioration.  Critical care was time spent personally by me on the following activities: development of treatment plan with patient and/or surrogate as well as nursing, discussions with consultants, evaluation of patient's response to treatment, examination of patient, obtaining history from patient or surrogate, ordering and performing treatments and interventions, ordering and review of laboratory studies, ordering and review of radiographic studies, pulse oximetry and re-evaluation of patient's condition. Labs Review Labs Reviewed  BASIC METABOLIC PANEL - Abnormal; Notable for the following:    Sodium 134 (*)    Chloride 98 (*)    CO2 13 (*)     Glucose, Bld 668 (*)    Creatinine, Ser 1.18 (*)    GFR calc non Af Amer 54 (*)    Anion gap 23 (*)    All other components within normal limits  CBC - Abnormal; Notable for the following:    RBC 3.53 (*)    Hemoglobin 9.4 (*)    HCT 29.8 (*)    RDW 16.6 (*)    Platelets 459 (*)    All other components within normal limits  URINALYSIS, ROUTINE W REFLEX MICROSCOPIC (NOT AT [facility]) - Abnormal; Notable for the following:    Glucose, UA >1000 (*)    Hgb urine dipstick MODERATE (*)    Ketones, ur >80 (*)    All other components within normal limits  URINE MICROSCOPIC-ADD ON - Abnormal; Notable for the following:    Squamous Epithelial / LPF 0-5 (*)    Bacteria, UA RARE (*)    All other components within normal limits  CBG MONITORING, ED - Abnormal; Notable for the following:    Glucose-Capillary 567 (*)    All other components within normal  limits  CBG MONITORING, ED - Abnormal; Notable for the following:    Glucose-Capillary 416 (*)    All other components within normal limits    Imaging Review No results found. I have personally reviewed and evaluated these images and lab results as part of my medical decision-making.   EKG Interpretation None     Consult: His case is reviewed with Dr. Charlies Silvers for admission. MDM   Final diagnoses:  Diabetic ketoacidosis without coma associated with type 1 diabetes mellitus ([place])   Patient has chronic very poorly controlled diabetes and recurrent gastroparesis. At this time she is acidotic and hyperglycemic. Patient is alert and has no respiratory distress. She will be admitted with insulin drip and fluids.    Charlesetta Shanks, MD 06/29/15 [phone]

## 2015-06-30 DIAGNOSIS — F418 Other specified anxiety disorders: Secondary | ICD-10-CM | POA: Diagnosis not present

## 2015-06-30 DIAGNOSIS — N183 Chronic kidney disease, stage 3 (moderate): Secondary | ICD-10-CM | POA: Diagnosis not present

## 2015-06-30 DIAGNOSIS — E1010 Type 1 diabetes mellitus with ketoacidosis without coma: Secondary | ICD-10-CM | POA: Diagnosis not present

## 2015-06-30 DIAGNOSIS — D631 Anemia in chronic kidney disease: Secondary | ICD-10-CM

## 2015-06-30 DIAGNOSIS — E1143 Type 2 diabetes mellitus with diabetic autonomic (poly)neuropathy: Secondary | ICD-10-CM | POA: Diagnosis not present

## 2015-06-30 LAB — COMPREHENSIVE METABOLIC PANEL
ALT: 16 U/L (ref 14–54)
AST: 17 U/L (ref 15–41)
Albumin: 3.2 g/dL — ABNORMAL LOW (ref 3.5–5.0)
Alkaline Phosphatase: 67 U/L (ref 38–126)
Anion gap: 15 (ref 5–15)
BUN: 13 mg/dL (ref 6–20)
CHLORIDE: 114 mmol/L — AB (ref 101–111)
CO2: 14 mmol/L — AB (ref 22–32)
Calcium: 8.8 mg/dL — ABNORMAL LOW (ref 8.9–10.3)
Creatinine, Ser: 0.84 mg/dL (ref 0.44–1.00)
GLUCOSE: 122 mg/dL — AB (ref 65–99)
POTASSIUM: 3.7 mmol/L (ref 3.5–5.1)
Sodium: 143 mmol/L (ref 135–145)
TOTAL PROTEIN: 6.3 g/dL — AB (ref 6.5–8.1)
Total Bilirubin: 1 mg/dL (ref 0.3–1.2)

## 2015-06-30 LAB — BASIC METABOLIC PANEL
Anion gap: 13 (ref 5–15)
Anion gap: 10 (ref 5–15)
BUN: 12 mg/dL (ref 6–20)
BUN: 11 mg/dL (ref 6–20)
CHLORIDE: 106 mmol/L (ref 101–111)
CHLORIDE: 109 mmol/L (ref 101–111)
CO2: 19 mmol/L — AB (ref 22–32)
CO2: 20 mmol/L — AB (ref 22–32)
CREATININE: 0.94 mg/dL (ref 0.44–1.00)
CREATININE: 0.97 mg/dL (ref 0.44–1.00)
Calcium: 9 mg/dL (ref 8.9–10.3)
Calcium: 9.2 mg/dL (ref 8.9–10.3)
GFR calc Af Amer: >60 mL/min (ref >60)
GFR calc Af Amer: >60 mL/min (ref >60)
GFR calc non Af Amer: >60 mL/min (ref >60)
GFR calc non Af Amer: >60 mL/min (ref >60)
GLUCOSE: 245 mg/dL — AB (ref 65–99)
Glucose, Bld: 57 mg/dL — ABNORMAL LOW (ref 65–99)
Potassium: 3.6 mmol/L (ref 3.5–5.1)
Potassium: 4.8 mmol/L (ref 3.5–5.1)
SODIUM: 139 mmol/L (ref 135–145)
Sodium: 138 mmol/L (ref 135–145)

## 2015-06-30 LAB — CBC
HEMATOCRIT: 24.1 % — AB (ref 36.0–46.0)
HEMOGLOBIN: 7.7 g/dL — AB (ref 12.0–15.0)
MCH: 26.4 pg (ref 26.0–34.0)
MCHC: 32 g/dL (ref 30.0–36.0)
MCV: 82.5 fL (ref 78.0–100.0)
Platelets: 370 10*3/uL (ref 150–400)
RBC: 2.92 MIL/uL — ABNORMAL LOW (ref 3.87–5.11)
RDW: 16.5 % — ABNORMAL HIGH (ref 11.5–15.5)
WBC: 7.9 10*3/uL (ref 4.0–10.5)

## 2015-06-30 LAB — GLUCOSE, CAPILLARY
GLUCOSE-CAPILLARY: 110 mg/dL — AB (ref 65–99)
GLUCOSE-CAPILLARY: 117 mg/dL — AB (ref 65–99)
GLUCOSE-CAPILLARY: 137 mg/dL — AB (ref 65–99)
GLUCOSE-CAPILLARY: 157 mg/dL — AB (ref 65–99)
GLUCOSE-CAPILLARY: 162 mg/dL — AB (ref 65–99)
GLUCOSE-CAPILLARY: 240 mg/dL — AB (ref 65–99)
GLUCOSE-CAPILLARY: 253 mg/dL — AB (ref 65–99)
GLUCOSE-CAPILLARY: 49 mg/dL — AB (ref 65–99)
GLUCOSE-CAPILLARY: 56 mg/dL — AB (ref 65–99)
GLUCOSE-CAPILLARY: 59 mg/dL — AB (ref 65–99)
GLUCOSE-CAPILLARY: 65 mg/dL (ref 65–99)
GLUCOSE-CAPILLARY: 82 mg/dL (ref 65–99)
Glucose-Capillary: 102 mg/dL — ABNORMAL HIGH (ref 65–99)
Glucose-Capillary: 104 mg/dL — ABNORMAL HIGH (ref 65–99)
Glucose-Capillary: 132 mg/dL — ABNORMAL HIGH (ref 65–99)
Glucose-Capillary: 191 mg/dL — ABNORMAL HIGH (ref 65–99)
Glucose-Capillary: 240 mg/dL — ABNORMAL HIGH (ref 65–99)
Glucose-Capillary: 211 mg/dL — ABNORMAL HIGH (ref 65–99)
Glucose-Capillary: 255 mg/dL — ABNORMAL HIGH (ref 65–99)
Glucose-Capillary: 118 mg/dL — ABNORMAL HIGH (ref 65–99)
Glucose-Capillary: 146 mg/dL — ABNORMAL HIGH (ref 65–99)
Glucose-Capillary: 56 mg/dL — ABNORMAL LOW (ref 65–99)
Glucose-Capillary: 56 mg/dL — ABNORMAL LOW (ref 65–99)
Glucose-Capillary: 66 mg/dL (ref 65–99)
Glucose-Capillary: 81 mg/dL (ref 65–99)
Glucose-Capillary: 86 mg/dL (ref 65–99)

## 2015-06-30 MED ORDER — DEXTROSE 50 % IV SOLN
INTRAVENOUS | Status: AC
  Filled 2015-06-30: qty 50

## 2015-06-30 MED ORDER — GLUCOSE 40 % PO GEL
1.0000 | Freq: Once | ORAL | Status: AC
  Administered 2015-06-30: 37.5 g via ORAL

## 2015-06-30 MED ORDER — INSULIN ASPART 100 UNIT/ML ~~LOC~~ SOLN
0.0000 [IU] | SUBCUTANEOUS | Status: DC
  Administered 2015-06-30: 8 [IU] via SUBCUTANEOUS
  Administered 2015-07-01: 10 [IU] via SUBCUTANEOUS
  Administered 2015-07-01: 8 [IU] via SUBCUTANEOUS
  Administered 2015-07-01: 2 [IU] via SUBCUTANEOUS
  Administered 2015-07-02: 8 [IU] via SUBCUTANEOUS
  Administered 2015-07-02: 2 [IU] via SUBCUTANEOUS
  Administered 2015-07-02: 8 [IU] via SUBCUTANEOUS
  Administered 2015-07-02: 2 [IU] via SUBCUTANEOUS
  Administered 2015-07-02: 15 [IU] via SUBCUTANEOUS

## 2015-06-30 MED ORDER — GLUCERNA SHAKE PO LIQD
237.0000 mL | Freq: Three times a day (TID) | ORAL | Status: DC
  Administered 2015-06-30 – 2015-07-03 (×8): 237 mL via ORAL
  Filled 2015-06-30 (×11): qty 237

## 2015-06-30 MED ORDER — GLUCOSE 40 % PO GEL
1.0000 | ORAL | Status: DC | PRN
  Administered 2015-07-01: 37.5 g via ORAL

## 2015-06-30 MED ORDER — GLUCOSE 40 % PO GEL
ORAL | Status: AC
  Filled 2015-06-30: qty 1

## 2015-06-30 NOTE — Progress Notes (Signed)
Initial Nutrition Assessment  DOCUMENTATION CODES:   Severe malnutrition in context of chronic illness  INTERVENTION:   Provide Glucerna Shake po TID, each supplement provides 220 kcal and 10 grams of protein Encourage PO intake RD to continue to monitor  NUTRITION DIAGNOSIS:   Malnutrition related to chronic illness as evidenced by percent weight loss, severe depletion of muscle mass.  GOAL:   Patient will meet greater than or equal to 90% of their needs  MONITOR:   PO intake, Supplement acceptance, Labs, Weight trends, Skin, I & O's  REASON FOR ASSESSMENT:   Malnutrition Screening Tool    ASSESSMENT:   50 y.o. female with past medical history of type 1 diabetes on insulin (non-compliance with insulin), multiple admission (11 admissions in 2016) for DKA and/or gastroparesis, history of diabetic neuropathy, anxiety and depression who presented to [facility] ED with diffuse abdominal pain, nausea and vomiting started about 1 day prior to this admission.   Pt reports N/V PTA. Pt has been following a soft diet. In the mornings she has grits and eggs and hot tea. She sometimes skips lunch and for dinner mainly has mashed potatoes, green beans and macaroni and cheese. She doesn't like this diet but she is trying to stay away from fiber.  Pt states she is willing to try nutritional supplements. Encouraged adequate protein intake. Pt states her UBW is 140 lb. She has lost 12 lb since 12/25 (10% weight loss x 1.5 weeks, significant for time frame). RD to order Glucerna shakes.  Nutrition-Focused physical exam completed. Findings are no fat depletion, severe muscle depletion, and no edema.   Labs reviewed: CBGs: 118-211  Diet Order:  Diet Carb Modified Fluid consistency:: Thin; Room service appropriate?: Yes  Skin:  Reviewed, no issues  Last BM:  1/4  Height:   Ht Readings from Last 1 Encounters:  06/29/15 5\' 2"  (1.575 m)    Weight:   Wt Readings from Last 1 Encounters:   06/30/15 108 lb 7.5 oz (49.2 kg)    Ideal Body Weight:  50 kg  BMI:  Body mass index is 19.83 kg/(m^2).  Estimated Nutritional Needs:   Kcal:  1400-1600  Protein:  60-70g  Fluid:  1.5L/day  EDUCATION NEEDS:   Education needs addressed  Clayton Bibles, MS, RD, LDN Pager: [phone] After Hours Pager: [phone]

## 2015-06-30 NOTE — Progress Notes (Signed)
CBG 49. MD notified of continually low CBG despite insulin gtt being off several hours. Nurse unable to give D50 because of IV situation. Patient has one positional IV. IV team unable to find access. Lab having difficult time drawing blood from patient. MD aware of situation. Will give oral glucose

## 2015-06-30 NOTE — Progress Notes (Signed)
Pt has lost IV access. Current IV is infiltrating. Patient extremely hard  IV stick. IV team notified and stated they can no longer find access on patient and are unable to place PICC in her due to poor vasculature. MD Charlies Silvers paged about situation. Charge Nurse aware of situation. Per Charlies Silvers leave patient in ICU overnight to monitor without IV access and a plan for access will have to be discussed in the AM. Will continue to monitor.

## 2015-06-30 NOTE — Progress Notes (Signed)
Inpatient Diabetes Program Recommendations  AACE/ADA: New Consensus Statement on Inpatient Glycemic Control (2015)  Target Ranges:  Prepandial:   less than 140 mg/dL      Peak postprandial:   less than 180 mg/dL (1-2 hours)      Critically ill patients:  140 - 180 mg/dL   Review of Glycemic Control  Diabetes history: DM1 Outpatient Diabetes medications: Levemir 15 units QHS, Novolog 2-15 units TID with meals Current orders for Inpatient glycemic control: IV insulin per GlucoStabilizer DKA order set  Inpatient Diabetes Program Recommendations: Insulin - IV drip/GlucoStabilizer:  Insulin-Meal Coverage: Noted patient has diet ordered while on IV insulin drip.  IV insulin infusion with sufficient glucose should be continued until acidosis is corrected as determined by MD (Venous CO2=20, normal anion gap (8-12), negative ketones). When ready for transition, may want to consider ordering Levemir 10 units Q24H and Novolog 0-9 units Q4H, then tidwc and hs. Will need Novolog 3 units tidwc for meal coverage insulin. Per DKA protocol, RN to discontinue insulin drip 2 hours after subcutaneous basal insulin given and simultaneously give Novolog correction scale as ordered. *Had low blood sugar after eating breakfast - treating per Hypoglycemia Protocol.  Our Inpatient Diabetes Team has spoken with pt on numerous times regarding her diabetes control. See progress notes dated 12/5, 12/8, 12/9, 12/21, 12/28, 12/29, and 12/30. Has been instructed numerous times about taking her insulin even when she has nausea and vomiting.   Long discussion with pt regarding glucose control at home. States she takes her insulin most of the time. When questioned about how she pays for her insulin, she stated she has enough of the Levemir and Novolog to last several months. This Coordinator will call her pharmacy to verify. Requested son bring her meter in to check how much she is monitoring at home.  Will continue to  follow. Thank you. Lorenda Peck, RD, LDN, CDE Inpatient Diabetes Coordinator [phone]

## 2015-06-30 NOTE — Care Management Note (Signed)
Case Management Note  Patient Details  Name: Adysen Sek MRN: [id number] Date of Birth: 1965-09-03  Subjective/Objective:       diabetic             Action/Plan:Date: June 30, 2015 Chart reviewed for concurrent status and case management needs. Will continue to follow patient for changes and needs: Velva Harman, RN, BSN, [place]   [phone]   Expected Discharge Date:   (unknown)               Expected Discharge Plan:  Home/Self Care  In-House Referral:  NA  Discharge planning Services  CM Consult  Post Acute Care Choice:    Choice offered to:     DME Arranged:    DME Agency:     HH Arranged:    [place] Agency:     Status of Service:  In process, will continue to follow  Medicare Important Message Given:    Date Medicare IM Given:    Medicare IM give by:    Date Additional Medicare IM Given:    Additional Medicare Important Message give by:     If discussed at [place] of Stay Meetings, dates discussed:    Additional Comments:  Leeroy Cha, RN 06/30/2015, 11:43 AM

## 2015-06-30 NOTE — Progress Notes (Signed)
Patient CBG 56. Rechecked and still 56. Orange juice, peanut butter and graham crackers given. Insulin gtt placed on hold. Will recheck in 15 minutes.

## 2015-06-30 NOTE — Progress Notes (Signed)
Patient ID: [name], female   DOB: [birth year], 50 y.o.   MRN: [id number] [facility] PROGRESS NOTE  [name] [id number] DOB: Nov 26, 1965 DOA: 06/29/2015 PCP: Barbette Merino, MD  Brief narrative:    50 y.o. female with past medical history of type 1 diabetes on insulin (non-compliance with insulin), multiple admission (11 admissions in 2016) for DKA and/or gastroparesis, history of diabetic neuropathy, anxiety and depression who presented to [facility] ED with diffuse abdominal pain, nausea and vomiting started about 1 day prior to this admission.   In ED, BP was 140/67, HR 102, RR 22. Blood work showed hemoglobin of 9.4, creatinine 1.18, CO2 13, glucose 668, anion gap 23. UA showed ketones and glucose of more than 1000. She was started on insulin drip and admitted to SDU.  Assessment/Plan:    Principal Problem:  Diabetic ketoacidosis without coma associated with type 1 diabetes mellitus ([place]) / High anion gap metabolic acidosis / Uncontrolled type 2 diabetes mellitus with diabetic neuropathy, with long-term current use of insulin ([place]) - DKA criteria met on admission with CBG 567, glucose on BMP 668, ketone in urine, AG of 23/ Ketones were present on urinalysis - Pt admitted to SDU because she required insulin drip - Patient still slightly acidotic with CO2 of 19 but anticipate she will come off of insulin drip shortly. - She will remain in step down unit since she requires insulin drip but we will transfer her to regular floor once she is transitioned to subcutaneous insulin - Continue to monitor BMP and CBG per DKA protocol  Active Problems:  Nausea & vomiting / Diabetic gastroparesis ([place]) - Secondary to uncontrolled diabetes, noncompliance with insulin - A1c on this admission is pending - We will advance diet to carb modified. Nausea is controlled with Reglan and Phenergan   CKD (chronic kidney disease) stage 3, GFR 30-59 ml/min - Baseline Cr 1.4 and on this admission Cr  within baseline values   Anemia of chronic kidney failure - Hemoglobin 9.4, stable    Anxiety and depression - Continue prozac  - Stable - Not depressed   DVT Prophylaxis  - SCD's bilaterally in hospital    Code Status: Full.  Family Communication:  plan of care discussed with the patient Disposition Plan: Remains in step down unit because she still requires insulin drip   IV access:  Peripheral IV  Procedures and diagnostic studies:    No results found.  Medical Consultants:  None   Other Consultants:  Nutrition  IAnti-Infectives:   None    Leisa Lenz, MD  [facility] Pager [phone]  Time spent in minutes: 25 minutes  If 7PM-7AM, please contact night-coverage [email or web address] Password [facility] 06/30/2015, 11:34 AM   LOS: 1 day    HPI/Subjective: No acute overnight events. Patient reports she feels better.  Objective: Filed Vitals:   06/30/15 0700 06/30/15 0800 06/30/15 0900 06/30/15 1000  BP: 144/80 139/72 133/69 117/55  Pulse: 89 99 86 85  Temp:  97.9 F (36.6 C)    TempSrc:  Oral    Resp: 13 16 14 14   Height:      Weight:      SpO2: 100% 100% 99% 98%    Intake/Output Summary (Last 24 hours) at 06/30/15 1134 Last data filed at 06/30/15 1000  Gross per 24 hour  Intake 1052.21 ml  Output      0 ml  Net 1052.21 ml    Exam:   General:  Pt is alert, follows commands appropriately, not in acute  distress  Cardiovascular: Regular rate and rhythm, S1/S2 (+)  Respiratory: Clear to auscultation bilaterally, no wheezing, no crackles, no rhonchi  Abdomen: Soft, non tender, non distended, bowel sounds present  Extremities: No edema, pulses DP and PT palpable bilaterally  Neuro: Grossly nonfocal  Data Reviewed: Basic Metabolic Panel:  Recent Labs Lab 06/23/15 1535 06/24/15 0454 06/25/15 0538 06/26/15 0437 06/29/15 1519 06/29/15 2122 06/30/15 0120 06/30/15 0743  NA 138 133* 135 137 134* 142 143 138  K 3.2* 3.2* 3.5 3.7 4.0 3.6  3.7 3.6  CL 107 102 105 100* 98* 112* 114* 106  CO2 21* 20* 25 22 13* 19* 14* 19*  GLUCOSE 69 241* 123* 369* 668* 156* 122* 245*  BUN <5* <5* <5* 8 15 13 13 12   CREATININE 0.83 0.84 0.80 0.80 1.18* 0.83 0.84 0.94  CALCIUM 8.6* 8.2* 8.3* 8.8* 9.2 8.8* 8.8* 9.0  MG 1.5* 2.0 1.8  --   --   --   --   --    Liver Function Tests:  Recent Labs Lab 06/24/15 0454 06/25/15 0538 06/26/15 0437 06/30/15 0120  AST 29 25 22 17   ALT 17 15 20 16   ALKPHOS 73 72 86 67  BILITOT 0.6 0.5 1.3* 1.0  PROT 5.9* 5.7* 6.8 6.3*  ALBUMIN 3.0* 2.7* 3.5 3.2*    Recent Labs Lab 06/26/15 0437  LIPASE 62*   No results for input(s): AMMONIA in the last 168 hours. CBC:  Recent Labs Lab 06/24/15 0454 06/25/15 0538 06/26/15 0437 06/29/15 1519 06/30/15 0120  WBC 4.9 3.6* 4.1 8.3 7.9  NEUTROABS 2.8 1.8  --   --   --   HGB 9.5* 9.7* 9.0* 9.4* 7.7*  HCT 28.9* 29.4* 28.6* 29.8* 24.1*  MCV 81.9 81.2 83.9 84.4 82.5  PLT 369 305 365 459* 370   Cardiac Enzymes: No results for input(s): CKTOTAL, CKMB, CKMBINDEX, TROPONINI in the last 168 hours. BNP: Invalid input(s): POCBNP CBG:  Recent Labs Lab 06/30/15 0604 06/30/15 0657 06/30/15 0814 06/30/15 0920 06/30/15 1006  GLUCAP 255* 240* 211* 146* 118*    Recent Results (from the past 240 hour(s))  C difficile quick scan w PCR reflex     Status: None   Collection Time: 06/22/15 10:58 AM  Result Value Ref Range Status   C Diff antigen NEGATIVE NEGATIVE Final   C Diff toxin NEGATIVE NEGATIVE Final   C Diff interpretation Negative for toxigenic C. difficile  Final  Gastrointestinal Panel by PCR , Stool     Status: None   Collection Time: 06/22/15 10:58 AM  Result Value Ref Range Status   Campylobacter species NOT DETECTED NOT DETECTED Final   Plesimonas shigelloides NOT DETECTED NOT DETECTED Final   Salmonella species NOT DETECTED NOT DETECTED Final   Yersinia enterocolitica NOT DETECTED NOT DETECTED Final   Vibrio species NOT DETECTED NOT  DETECTED Final   Vibrio cholerae NOT DETECTED NOT DETECTED Final   Enteroaggregative E coli (EAEC) NOT DETECTED NOT DETECTED Final   Enteropathogenic E coli (EPEC) NOT DETECTED NOT DETECTED Final   Enterotoxigenic E coli (ETEC) NOT DETECTED NOT DETECTED Final   Shiga like toxin producing E coli (STEC) NOT DETECTED NOT DETECTED Final   E. coli O157 NOT DETECTED NOT DETECTED Final   Shigella/Enteroinvasive E coli (EIEC) NOT DETECTED NOT DETECTED Final   Cryptosporidium NOT DETECTED NOT DETECTED Final   Cyclospora cayetanensis NOT DETECTED NOT DETECTED Final   Entamoeba histolytica NOT DETECTED NOT DETECTED Final   Giardia lamblia NOT DETECTED  NOT DETECTED Final   Adenovirus F40/41 NOT DETECTED NOT DETECTED Final   Astrovirus NOT DETECTED NOT DETECTED Final   Norovirus GI/GII NOT DETECTED NOT DETECTED Final   Rotavirus A NOT DETECTED NOT DETECTED Final   Sapovirus (I, II, IV, and V) NOT DETECTED NOT DETECTED Final     Scheduled Meds: . atorvastatin  20 mg Oral q1800  . dronabinol  5 mg Oral BID  . feeding supplement (GLUCERNA SHAKE)  237 mL Oral TID BM  . FLUoxetine  40 mg Oral Daily  . metoCLOPramide (REGLAN) injection  10 mg Intravenous 3 times per day  . pantoprazole  40 mg Oral Daily  . pregabalin  150 mg Oral BID  . sodium chloride  3 mL Intravenous Q12H   Continuous Infusions: . sodium chloride Stopped (06/29/15 2055)  . dextrose 5 % and 0.45% NaCl 75 mL/hr at 06/30/15 1047  . insulin (NOVOLIN-R) infusion 2.3 mL/hr at 06/30/15 1000

## 2015-06-30 NOTE — Progress Notes (Signed)
CBG now 86. Insulin gtt to remain off per Dr. Charlies Silvers.

## 2015-06-30 NOTE — Progress Notes (Signed)
Pt CBG remains 56. Rechecked still 56. Orange Juice and peanut butter given. Will recheck in 12 minutes.

## 2015-06-30 NOTE — Progress Notes (Signed)
Per conversation with MD Charlies Silvers, Nurse is to place patient on Q4 sliding scale insulin and Q4 CBG.

## 2015-06-30 NOTE — Progress Notes (Addendum)
Patient CBG is 56. Rechecked still reading 56. Orange Juice given. Graham crackers and peanut butter given.

## 2015-06-30 NOTE — Progress Notes (Signed)
Oral Glucose given. Rechecked CBG 15 minutes after and CBG still low. Patient Dietary tray has arrived. Will let PT eat and then recheck CBG.

## 2015-07-01 DIAGNOSIS — N183 Chronic kidney disease, stage 3 (moderate): Secondary | ICD-10-CM | POA: Diagnosis not present

## 2015-07-01 DIAGNOSIS — F418 Other specified anxiety disorders: Secondary | ICD-10-CM | POA: Diagnosis not present

## 2015-07-01 DIAGNOSIS — E1143 Type 2 diabetes mellitus with diabetic autonomic (poly)neuropathy: Secondary | ICD-10-CM | POA: Diagnosis not present

## 2015-07-01 DIAGNOSIS — E1010 Type 1 diabetes mellitus with ketoacidosis without coma: Secondary | ICD-10-CM | POA: Diagnosis not present

## 2015-07-01 LAB — GLUCOSE, CAPILLARY
GLUCOSE-CAPILLARY: 89 mg/dL (ref 65–99)
GLUCOSE-CAPILLARY: 73 mg/dL (ref 65–99)
GLUCOSE-CAPILLARY: 39 mg/dL — AB (ref 65–99)
GLUCOSE-CAPILLARY: 110 mg/dL — AB (ref 65–99)
GLUCOSE-CAPILLARY: 55 mg/dL — AB (ref 65–99)
Glucose-Capillary: 125 mg/dL — ABNORMAL HIGH (ref 65–99)
Glucose-Capillary: 285 mg/dL — ABNORMAL HIGH (ref 65–99)
Glucose-Capillary: 304 mg/dL — ABNORMAL HIGH (ref 65–99)
Glucose-Capillary: 36 mg/dL — CL (ref 65–99)
Glucose-Capillary: 406 mg/dL — ABNORMAL HIGH (ref 65–99)
Glucose-Capillary: 436 mg/dL — ABNORMAL HIGH (ref 65–99)

## 2015-07-01 LAB — GLUCOSE, RANDOM: Glucose, Bld: 467 mg/dL — ABNORMAL HIGH (ref 65–99)

## 2015-07-01 MED ORDER — INSULIN ASPART 100 UNIT/ML ~~LOC~~ SOLN: 7.0000 [IU] | Freq: Three times a day (TID) | SUBCUTANEOUS | Status: DC

## 2015-07-01 MED ORDER — METOCLOPRAMIDE HCL 10 MG PO TABS
10.0000 mg | ORAL_TABLET | Freq: Four times a day (QID) | ORAL | Status: DC | PRN
  Administered 2015-07-01: 10 mg via ORAL
  Filled 2015-07-01: qty 1

## 2015-07-01 MED ORDER — METOCLOPRAMIDE HCL 5 MG/ML IJ SOLN: 10.0000 mg | Freq: Four times a day (QID) | INTRAMUSCULAR | Status: DC

## 2015-07-01 MED ORDER — INSULIN ASPART 100 UNIT/ML ~~LOC~~ SOLN
5.0000 [IU] | Freq: Once | SUBCUTANEOUS | Status: AC
  Administered 2015-07-01: 5 [IU] via SUBCUTANEOUS

## 2015-07-01 MED ORDER — ONDANSETRON HCL 4 MG PO TABS
4.0000 mg | ORAL_TABLET | Freq: Three times a day (TID) | ORAL | Status: DC | PRN
  Administered 2015-07-01 (×2): 4 mg via ORAL
  Filled 2015-07-01 (×2): qty 1

## 2015-07-01 MED ORDER — INSULIN DETEMIR 100 UNIT/ML ~~LOC~~ SOLN
10.0000 [IU] | Freq: Every day | SUBCUTANEOUS | Status: DC
  Administered 2015-07-01: 10 [IU] via SUBCUTANEOUS
  Filled 2015-07-01: qty 0.1

## 2015-07-01 MED ORDER — GLUCOSE 40 % PO GEL
ORAL | Status: AC
  Filled 2015-07-01: qty 1

## 2015-07-01 MED ORDER — INSULIN ASPART 100 UNIT/ML ~~LOC~~ SOLN
7.0000 [IU] | Freq: Three times a day (TID) | SUBCUTANEOUS | Status: DC
  Administered 2015-07-01 (×2): 7 [IU] via SUBCUTANEOUS

## 2015-07-01 NOTE — Progress Notes (Addendum)
Inpatient Diabetes Program Recommendations  AACE/ADA: New Consensus Statement on Inpatient Glycemic Control (2015)  Target Ranges:  Prepandial:   less than 140 mg/dL      Peak postprandial:   less than 180 mg/dL (1-2 hours)      Critically ill patients:  140 - 180 mg/dL   Review of Glycemic Control  Results for [name], [name] (MRN [id number]) as of 07/01/2015 12:00  Ref. Range 07/01/2015 05:20 07/01/2015 08:30 07/01/2015 10:04 07/01/2015 11:15  Glucose-Capillary Latest Ref Range: 65-99 mg/dL 89 304 (H) 406 (H) 285 (H)   Inpatient Diabetes Program Recommendations:   Levemir 10 units BMET  Addendum: change Novolog to TID + HS scale since pt is now eating and meal coverage is ordered.  Thank you  Raoul Pitch BSN, RN,CDE Inpatient Diabetes Coordinator [phone] (team pager)

## 2015-07-01 NOTE — Care Management Note (Signed)
Case Management Note  Patient Details  Name: Minahil Hults MRN: [id number] Date of Birth: 08-08-1965  Subjective/Objective: Spoke to patient-she has novolog, & glucometer @ home.  If no new meds then patient has all meds @ home, then  [facility] program NOT necessary-Patient voiced understanding.                 Action/Plan:d/c plan home.   Expected Discharge Date:   (unknown)               Expected Discharge Plan:  Home/Self Care  In-House Referral:  NA  Discharge planning Services  CM Consult, Medication Assistance, MATCH Program  Post Acute Care Choice:    Choice offered to:     DME Arranged:    DME Agency:     HH Arranged:    HH Agency:     Status of Service:  In process, will continue to follow  Medicare Important Message Given:    Date Medicare IM Given:    Medicare IM give by:    Date Additional Medicare IM Given:    Additional Medicare Important Message give by:     If discussed at [place] of Stay Meetings, dates discussed:    Additional Comments:  Dessa Phi, RN 07/01/2015, 1:42 PM

## 2015-07-01 NOTE — Progress Notes (Addendum)
Patient ID: [name], female   DOB: Aug 23, 1965, 50 y.o.   MRN: [id number] [facility] PROGRESS NOTE  [name] [id number] DOB: 13-Oct-1965 DOA: 06/29/2015 PCP: Barbette Merino, MD  Brief narrative:    50 y.o. female with past medical history of type 1 diabetes on insulin (non-compliance with insulin), multiple admission (11 admissions in 2016) for DKA and/or gastroparesis, history of diabetic neuropathy, anxiety and depression who presented to [facility] ED with diffuse abdominal pain, nausea and vomiting started about 1 day prior to this admission.   In ED, BP was 140/67, HR 102, RR 22. Blood work showed hemoglobin of 9.4, creatinine 1.18, CO2 13, glucose 668, anion gap 23. UA showed ketones and glucose of more than 1000. She was started on insulin drip and admitted to SDU.  Transferred to telemetry 06/30/2015.  Assessment/Plan:    Principal Problem:  Diabetic ketoacidosis without coma associated with type 1 diabetes mellitus (HCC) / High anion gap metabolic acidosis / Uncontrolled type 2 diabetes mellitus with diabetic neuropathy, with long-term current use of insulin (HCC) - DKA criteria were met on admission with CBG 567, glucose on BMP 668, ketone in urine, AG of 23. Ketones were present on urinalysis - Patient initially in step down unit because she required insulin drip. - Patient off of insulin drip from 06/30/2015; transfer to telemetry floor 06/30/2015 - She is currently on sliding scale insulin, NovoLog 7 units 3 times daily and we added Levemir 10 units daily per diabetic coordinator recommendations - Continue carb modified diet - A1c in November 2016 was 7.4.  Active Problems:  Nausea & vomiting / Diabetic gastroparesis ([place]) - Secondary to uncontrolled diabetes, noncompliance with insulin - So far has nausea but no vomiting. She is on as needed Phenergan and for breakthrough nausea or vomiting Zofran. She is also scheduled Reglan   CKD (chronic kidney disease) stage  3, GFR 30-59 ml/min - Baseline Cr 1.4 and on this admission Cr within baseline values   Anemia of chronic kidney failure - Hemoglobin is stable - No current indications for transfusion   Anxiety and depression - Continue prozac     Severe protein calorie malnutrition - In the context of chronic illness - Nutrition consult  DVT Prophylaxis  - SCD's bilaterally    Code Status: Full.  Family Communication:  plan of care discussed with the patient Disposition Plan: Anticipated discharge 07/02/2015  IV access:  Peripheral IV  Procedures and diagnostic studies:    No results found.  Medical Consultants:  None   Other Consultants:  Nutrition  IAnti-Infectives:   None    Leisa Lenz, MD  [facility] Pager [phone]  Time spent in minutes: 25 minutes  If 7PM-7AM, please contact night-coverage [email or web address] Password [email or web address] 07/01/2015, 12:10 PM   LOS: 2 days    HPI/Subjective: No acute overnight events. Patient reports nausea.   Objective: Filed Vitals:   06/30/15 1900 06/30/15 1936 06/30/15 2000 07/01/15 0520  BP:    126/61  Pulse: 102  102 86  Temp:  98.3 F (36.8 C)  98.1 F (36.7 C)  TempSrc:  Oral  Oral  Resp: 19  20 20   Height:      Weight:      SpO2: 100%  99% 100%    Intake/Output Summary (Last 24 hours) at 07/01/15 1210 Last data filed at 07/01/15 0900  Gross per 24 hour  Intake    570 ml  Output    800 ml  Net   -230 ml  Exam:   General:  Pt is alert,  not in acute distress  Cardiovascular: RRR, S1/S2 appreciated   Respiratory: No wheezing, no crackles, no rhonchi  Abdomen: (+) BS, non tender   Extremities: No leg swelling, pulses palpable bilaterally  Neuro: Nonfocal   Data Reviewed: Basic Metabolic Panel:  Recent Labs Lab 06/25/15 0538  06/29/15 1519 06/29/15 2122 06/30/15 0120 06/30/15 0743 06/30/15 1534  NA 135  < > 134* 142 143 138 139  K 3.5  < > 4.0 3.6 3.7 3.6 4.8  CL 105  < > 98* 112* 114* 106  109  CO2 25  < > 13* 19* 14* 19* 20*  GLUCOSE 123*  < > 668* 156* 122* 245* 57*  BUN <5*  < > 15 13 13 12 11   CREATININE 0.80  < > 1.18* 0.83 0.84 0.94 0.97  CALCIUM 8.3*  < > 9.2 8.8* 8.8* 9.0 9.2  MG 1.8  --   --   --   --   --   --   < > = values in this interval not displayed. Liver Function Tests:  Recent Labs Lab 06/25/15 0538 06/26/15 0437 06/30/15 0120  AST 25 22 17   ALT 15 20 16   ALKPHOS 72 86 67  BILITOT 0.5 1.3* 1.0  PROT 5.7* 6.8 6.3*  ALBUMIN 2.7* 3.5 3.2*    Recent Labs Lab 06/26/15 0437  LIPASE 62*   No results for input(s): AMMONIA in the last 168 hours. CBC:  Recent Labs Lab 06/25/15 0538 06/26/15 0437 06/29/15 1519 06/30/15 0120  WBC 3.6* 4.1 8.3 7.9  NEUTROABS 1.8  --   --   --   HGB 9.7* 9.0* 9.4* 7.7*  HCT 29.4* 28.6* 29.8* 24.1*  MCV 81.2 83.9 84.4 82.5  PLT 305 365 459* 370   Cardiac Enzymes: No results for input(s): CKTOTAL, CKMB, CKMBINDEX, TROPONINI in the last 168 hours. BNP: Invalid input(s): POCBNP CBG:  Recent Labs Lab 07/01/15 0011 07/01/15 0520 07/01/15 0830 07/01/15 1004 07/01/15 1115  GLUCAP 125* 89 304* 406* 285*    Recent Results (from the past 240 hour(s))  C difficile quick scan w PCR reflex     Status: None   Collection Time: 06/22/15 10:58 AM  Result Value Ref Range Status   C Diff antigen NEGATIVE NEGATIVE Final   C Diff toxin NEGATIVE NEGATIVE Final   C Diff interpretation Negative for toxigenic C. difficile  Final  Gastrointestinal Panel by PCR , Stool     Status: None   Collection Time: 06/22/15 10:58 AM  Result Value Ref Range Status   Campylobacter species NOT DETECTED NOT DETECTED Final   Plesimonas shigelloides NOT DETECTED NOT DETECTED Final   Salmonella species NOT DETECTED NOT DETECTED Final   Yersinia enterocolitica NOT DETECTED NOT DETECTED Final   Vibrio species NOT DETECTED NOT DETECTED Final   Vibrio cholerae NOT DETECTED NOT DETECTED Final   Enteroaggregative E coli (EAEC) NOT DETECTED  NOT DETECTED Final   Enteropathogenic E coli (EPEC) NOT DETECTED NOT DETECTED Final   Enterotoxigenic E coli (ETEC) NOT DETECTED NOT DETECTED Final   Shiga like toxin producing E coli (STEC) NOT DETECTED NOT DETECTED Final   E. coli O157 NOT DETECTED NOT DETECTED Final   Shigella/Enteroinvasive E coli (EIEC) NOT DETECTED NOT DETECTED Final   Cryptosporidium NOT DETECTED NOT DETECTED Final   Cyclospora cayetanensis NOT DETECTED NOT DETECTED Final   Entamoeba histolytica NOT DETECTED NOT DETECTED Final   Giardia lamblia NOT DETECTED NOT  DETECTED Final   Adenovirus F40/41 NOT DETECTED NOT DETECTED Final   Astrovirus NOT DETECTED NOT DETECTED Final   Norovirus GI/GII NOT DETECTED NOT DETECTED Final   Rotavirus A NOT DETECTED NOT DETECTED Final   Sapovirus (I, II, IV, and V) NOT DETECTED NOT DETECTED Final     Scheduled Meds: . atorvastatin  20 mg Oral q1800  . dronabinol  5 mg Oral BID  . feeding supplement (GLUCERNA SHAKE)  237 mL Oral TID BM  . FLUoxetine  40 mg Oral Daily  . insulin aspart  0-15 Units Subcutaneous 6 times per day  . insulin aspart  7 Units Subcutaneous TID WC  . insulin detemir  10 Units Subcutaneous Daily  . pantoprazole  40 mg Oral Daily  . pregabalin  150 mg Oral BID  . sodium chloride  3 mL Intravenous Q12H   Continuous Infusions:

## 2015-07-01 NOTE — Care Management Note (Signed)
Case Management Note  Patient Details  Name: [name] MRN: [id number] Date of Birth: 03-29-1966  Subjective/Objective:  No insurance. Has pcp-Dr. Jonelle Sidle.Financial counselor following-completed financial asst applic. May need [place] program @ d/c. [facility] to screen.                  Action/Plan:d/c plan home.   Expected Discharge Date:   (unknown)               Expected Discharge Plan:  Home/Self Care  In-House Referral:  NA  Discharge planning Services  CM Consult, Medication Assistance, MATCH Program  Post Acute Care Choice:    Choice offered to:     DME Arranged:    DME Agency:     HH Arranged:    HH Agency:     Status of Service:  In process, will continue to follow  Medicare Important Message Given:    Date Medicare IM Given:    Medicare IM give by:    Date Additional Medicare IM Given:    Additional Medicare Important Message give by:     If discussed at [place] of Stay Meetings, dates discussed:    Additional Comments:  Dessa Phi, RN 07/01/2015, 12:12 PM

## 2015-07-01 NOTE — Hospital Discharge Follow-Up (Signed)
Transitional Care Clinic Care Coordination Note:  Admit date:  06/29/15 Discharge date: TBD Discharge Disposition: Home when stable Patient contact: [phone] (mobile) Emergency contact(s): [name] [phone]  This Case Manager reviewed patient's EMR and determined patient would benefit from post-discharge medical management and chronic care management services through the [facility]. Patient has a history of type 1 diabetes mellitus, diabetic gastroparesis, chronic kidney disease stage 3, anemia of chronic kidney disease, anxiety and depression, severe protein calorie malnutrition. Admitted with DKA. Patient has had 11 inpatient admissions and 13 ED visits in the last 6 months. This Case Manager met with patient to discuss the services and medical management that can be provided at the [facility]. Patient verbalized understanding and agreed to receive post-discharge care at the [facility].   Patient scheduled for Transitional Care appointment on 07/11/15 at 1400 with Dr. Jarold Song.  Clinic information and appointment time provided to patient. Appointment information also placed on AVS.  Assessment:       Home Environment: Patient lives with her son, [name], in an apartment. Patient has one flight of stairs to navigate to get to her apartment.       Support System: son       Level of functioning: Independent       Home DME: cane. Patient also indicated she has glucometer and diabetes supplies, including insulin.       Home care services: none       Transportation: Patient indicated her son takes her to her medical appointments. Patient denies difficulty getting to her medical appointments.        Food/Nutrition: Patient indicated she has access to needed food.        Medications: Patient gets her medications from [facility] on [address]. She denied difficulty affording/obtaining her medications. This Case Manager informed patient of pharmacy  resources available at [facility] and [place]. Also informed patient of clinic pharmacy hours. Patient appreciative of information.        Identified Barriers: Patient listed as uninsured. Patient explained that she has been unable to work for almost a year and is uncertain if her short-term disability is still active. Patient is aware that her previous insurance policy through [employer] is no longer active. Patient plans to call employer to clarify next week. Patient aware she is currently listed as uninsured.  Informed patient that [place] has numerous resources available for patients such as onsite pharmacy, Solicitor, (if uninsured. Explained patient may benefit from meeting with Financial Counselor to determine if eligible for the [employer] or [employer]), and onsite Education officer, museum. Patient appreciative of information.        PCP: Dr. Barbette Merino, MD [phone])             Arranged services:        Services communicated to: Dessa Phi, RN CM

## 2015-07-01 NOTE — Clinical Documentation Improvement (Signed)
Hospitalist  Please update your documentation within the medical record to reflect your response to this query. Thank you  Can the nutrition diagnosis of Malnutrition be further specified?   Document Severity - Severe(third degree), Moderate (second degree), Mild (first degree)  Other condition  Unable to clinically determine  Document any associated diagnoses/conditions  Supporting Information: :  06/30/15 Nutrition assessment noted.Marland KitchenMarland Kitchen"Severe malnutrition in context of chronic illness"... [place]: See full Nutrition assessment 06/30/15  Please exercise your independent, professional judgment when responding. A specific answer is not anticipated or expected.  Thank You, Ermelinda Das, RN, BSN, [place] Certified Clinical Documentation Specialist [place]: Health Information Management [phone]

## 2015-07-01 NOTE — Progress Notes (Signed)
Paged MD for pts CBG of 304 after eating breakfast. Md wrote new orders. Called MD to clarify on SS and meal time orders. MD stated to give the 7 units Novolog, recheck CBG in 30 mins, and then dose SS based off CBG level. Reglan changed to PO as well.

## 2015-07-01 NOTE — Progress Notes (Signed)
Pt noted to have CBG 36 by CNA. Dr. Charlies Silvers paged. Oral glucose, peanut butter & graham crackers, and orange juice given. New verbal orders received. Rechecked 15 minutes later CBG 39.

## 2015-07-01 NOTE — Progress Notes (Signed)
Dr. Charlies Silvers paged again for pt CBG of 406. MD verbalized to give 10 units of Novolog and then recheck.

## 2015-07-02 DIAGNOSIS — E1143 Type 2 diabetes mellitus with diabetic autonomic (poly)neuropathy: Secondary | ICD-10-CM | POA: Diagnosis not present

## 2015-07-02 DIAGNOSIS — E1010 Type 1 diabetes mellitus with ketoacidosis without coma: Secondary | ICD-10-CM | POA: Diagnosis not present

## 2015-07-02 DIAGNOSIS — N183 Chronic kidney disease, stage 3 (moderate): Secondary | ICD-10-CM | POA: Diagnosis not present

## 2015-07-02 DIAGNOSIS — F418 Other specified anxiety disorders: Secondary | ICD-10-CM | POA: Diagnosis not present

## 2015-07-02 LAB — GLUCOSE, CAPILLARY
GLUCOSE-CAPILLARY: 106 mg/dL — AB (ref 65–99)
GLUCOSE-CAPILLARY: 132 mg/dL — AB (ref 65–99)
GLUCOSE-CAPILLARY: 277 mg/dL — AB (ref 65–99)
GLUCOSE-CAPILLARY: 297 mg/dL — AB (ref 65–99)
GLUCOSE-CAPILLARY: 373 mg/dL — AB (ref 65–99)
Glucose-Capillary: 184 mg/dL — ABNORMAL HIGH (ref 65–99)
Glucose-Capillary: 125 mg/dL — ABNORMAL HIGH (ref 65–99)

## 2015-07-02 MED ORDER — INSULIN ASPART 100 UNIT/ML ~~LOC~~ SOLN: 7.0000 [IU] | Freq: Once | SUBCUTANEOUS | Status: DC

## 2015-07-02 MED ORDER — INSULIN DETEMIR 100 UNIT/ML ~~LOC~~ SOLN
10.0000 [IU] | Freq: Every day | SUBCUTANEOUS | Status: DC
  Administered 2015-07-02: 10 [IU] via SUBCUTANEOUS
  Filled 2015-07-02 (×2): qty 0.1

## 2015-07-02 NOTE — Progress Notes (Signed)
Patient ID: [name], female   DOB: 1966/05/03, 50 y.o.   MRN: [id number] [facility] PROGRESS NOTE  [name] [id number] DOB: January 15, 1966 DOA: 06/29/2015 PCP: Barbette Merino, MD  Brief narrative:    50 y.o. female with past medical history of type 1 diabetes on insulin (non-compliance with insulin), multiple admission (11 admissions in 2016) for DKA and/or gastroparesis, history of diabetic neuropathy, anxiety and depression who presented to [facility] ED with diffuse abdominal pain, nausea and vomiting started about 1 day prior to this admission.   In ED, BP was 140/67, HR 102, RR 22. Blood work showed hemoglobin of 9.4, creatinine 1.18, CO2 13, glucose 668, anion gap 23. UA showed ketones and glucose of more than 1000. She was started on insulin drip and admitted to SDU.  Transferred to telemetry 06/30/2015.  Assessment/Plan:    Principal Problem:  Diabetic ketoacidosis without coma associated with type 1 diabetes mellitus (HCC) / High anion gap metabolic acidosis / Uncontrolled type 2 diabetes mellitus with diabetic neuropathy, with long-term current use of insulin (HCC) - DKA criteria were met on admission with CBG 567, glucose on BMP 668, ketone in urine, AG of 23. Ketones were present on urinalysis - Patient off of insulin drip from 06/30/2015 - Transferred to telemetry floor 06/30/2015 - A1c in November 2016 was 7.4. - Continue carb modified diet - She seems to be brittle diabetic with significant changes in CBG's while on insulin. We will use for now Levemir 10 units at bedtime only along with SSi  Active Problems:  Nausea & vomiting / Diabetic gastroparesis (HCC) - Continue phenergan and Reglan - Zofran for breakthrough nausea and vomiting    CKD (chronic kidney disease) stage 3, GFR 30-59 ml/min - Baseline Cr 1.4 and on this admission Cr within baseline values   Anemia of chronic kidney failure - Hemoglobin is stable   Anxiety and depression - Continue prozac   - Stable - Not depressed     Severe protein calorie malnutrition - In the context of chronic illness - Seen by dietician   DVT Prophylaxis  - SCD's bilaterally    Code Status: Full.  Family Communication:  plan of care discussed with the patient Disposition Plan: home 1/8  IV access:  Peripheral IV  Procedures and diagnostic studies:    No results found.  Medical Consultants:  None   Other Consultants:  Nutrition DM coordinator   IAnti-Infectives:   None    Leisa Lenz, MD  [facility] Pager [phone]  Time spent in minutes: 15 minutes  If 7PM-7AM, please contact night-coverage [email or web address] Password [email or web address] 07/02/2015, 9:24 AM   LOS: 3 days    HPI/Subjective: No acute overnight events. Patient reports stomach cramps, nausea, no vomiting.   Objective: Filed Vitals:   07/01/15 0520 07/01/15 1400 07/01/15 2220 07/02/15 0554  BP: 126/61 110/57 121/52 116/52  Pulse: 86 101 96 85  Temp: 98.1 F (36.7 C) 98.4 F (36.9 C) 98.6 F (37 C) 98.4 F (36.9 C)  TempSrc: Oral Oral Oral Oral  Resp: _0 Height:      Weight:      SpO2: 100% 100% 100% 100%    Intake/Output Summary (Last 24 hours) at 07/02/15 0924 Last data filed at 07/01/15 1800  Gross per 24 hour  Intake    480 ml  Output      0 ml  Net    480 ml    Exam:   General:  Pt is  not in  acute distress  Cardiovascular: Rate controlled, S1/S2 appreciated   Abdomen: (+) BS, non distended, no rebound   Extremities: No edema, palpable pulses   Neuro: No focal deficits   Data Reviewed: Basic Metabolic Panel:  Recent Labs Lab 06/29/15 1519 06/29/15 2122 06/30/15 0120 06/30/15 0743 06/30/15 1534 07/01/15 2200  NA 134* 142 143 138 139  --   K 4.0 3.6 3.7 3.6 4.8  --   CL 98* 112* 114* 106 109  --   CO2 13* 19* 14* 19* 20*  --   GLUCOSE 668* 156* 122* 245* 57* 467*  BUN _0 --   CREATININE 1.18* 0.83 0.84 0.94 0.97  --   CALCIUM 9.2 8.8* 8.8* 9.0 9.2  --     Liver Function Tests:  Recent Labs Lab 06/26/15 0437 06/30/15 0120  AST 22 17  ALT 20 16  ALKPHOS 86 67  BILITOT 1.3* 1.0  PROT 6.8 6.3*  ALBUMIN 3.5 3.2*    Recent Labs Lab 06/26/15 0437  LIPASE 62*   No results for input(s): AMMONIA in the last 168 hours. CBC:  Recent Labs Lab 06/26/15 0437 06/29/15 1519 06/30/15 0120  WBC 4.1 8.3 7.9  HGB 9.0* 9.4* 7.7*  HCT 28.6* 29.8* 24.1*  MCV 83.9 84.4 82.5  PLT 365 459* 370   Cardiac Enzymes: No results for input(s): CKTOTAL, CKMB, CKMBINDEX, TROPONINI in the last 168 hours. BNP: Invalid input(s): POCBNP CBG:  Recent Labs Lab 07/01/15 2107 07/01/15 2345 07/02/15 0156 07/02/15 0404 07/02/15 0805  GLUCAP 436* 297* 184* 125* 106*    Recent Results (from the past 240 hour(s))  C difficile quick scan w PCR reflex     Status: None   Collection Time: 06/22/15 10:58 AM  Result Value Ref Range Status   C Diff antigen NEGATIVE NEGATIVE Final   C Diff toxin NEGATIVE NEGATIVE Final   C Diff interpretation Negative for toxigenic C. difficile  Final  Gastrointestinal Panel by PCR , Stool     Status: None   Collection Time: 06/22/15 10:58 AM  Result Value Ref Range Status   Campylobacter species NOT DETECTED NOT DETECTED Final   Plesimonas shigelloides NOT DETECTED NOT DETECTED Final   Salmonella species NOT DETECTED NOT DETECTED Final   Yersinia enterocolitica NOT DETECTED NOT DETECTED Final   Vibrio species NOT DETECTED NOT DETECTED Final   Vibrio cholerae NOT DETECTED NOT DETECTED Final   Enteroaggregative E coli (EAEC) NOT DETECTED NOT DETECTED Final   Enteropathogenic E coli (EPEC) NOT DETECTED NOT DETECTED Final   Enterotoxigenic E coli (ETEC) NOT DETECTED NOT DETECTED Final   Shiga like toxin producing E coli (STEC) NOT DETECTED NOT DETECTED Final   E. coli O157 NOT DETECTED NOT DETECTED Final   Shigella/Enteroinvasive E coli (EIEC) NOT DETECTED NOT DETECTED Final   Cryptosporidium NOT DETECTED NOT DETECTED  Final   Cyclospora cayetanensis NOT DETECTED NOT DETECTED Final   Entamoeba histolytica NOT DETECTED NOT DETECTED Final   Giardia lamblia NOT DETECTED NOT DETECTED Final   Adenovirus F40/41 NOT DETECTED NOT DETECTED Final   Astrovirus NOT DETECTED NOT DETECTED Final   Norovirus GI/GII NOT DETECTED NOT DETECTED Final   Rotavirus A NOT DETECTED NOT DETECTED Final   Sapovirus (I, II, IV, and V) NOT DETECTED NOT DETECTED Final     Scheduled Meds: . atorvastatin  20 mg Oral q1800  . dronabinol  5 mg Oral BID  . feeding supplement (GLUCERNA SHAKE)  237 mL Oral TID  BM  . FLUoxetine  40 mg Oral Daily  . insulin aspart  0-15 Units Subcutaneous 6 times per day  . insulin aspart  7 Units Subcutaneous Once  . insulin detemir  10 Units Subcutaneous QHS  . pantoprazole  40 mg Oral Daily  . pregabalin  150 mg Oral BID

## 2015-07-02 NOTE — Progress Notes (Signed)
[facility] notified of blood sugar of 436. Ordered to give 5units of insulin and recheck two hours later due to patients previous hypoglycemic episode.

## 2015-07-02 NOTE — Progress Notes (Signed)
Per Dr Charlies Silvers, will not be giving this am dose of novolog, order was based on 0430 cbg. Chisa Kushner, [employer]

## 2015-07-03 DIAGNOSIS — N183 Chronic kidney disease, stage 3 (moderate): Secondary | ICD-10-CM | POA: Diagnosis not present

## 2015-07-03 DIAGNOSIS — E1143 Type 2 diabetes mellitus with diabetic autonomic (poly)neuropathy: Secondary | ICD-10-CM | POA: Diagnosis not present

## 2015-07-03 DIAGNOSIS — F418 Other specified anxiety disorders: Secondary | ICD-10-CM | POA: Diagnosis not present

## 2015-07-03 DIAGNOSIS — E1010 Type 1 diabetes mellitus with ketoacidosis without coma: Secondary | ICD-10-CM | POA: Diagnosis not present

## 2015-07-03 LAB — GLUCOSE, CAPILLARY
GLUCOSE-CAPILLARY: 167 mg/dL — AB (ref 65–99)
GLUCOSE-CAPILLARY: 242 mg/dL — AB (ref 65–99)
Glucose-Capillary: 169 mg/dL — ABNORMAL HIGH (ref 65–99)
Glucose-Capillary: 226 mg/dL — ABNORMAL HIGH (ref 65–99)

## 2015-07-03 MED ORDER — ONDANSETRON 4 MG PO TBDP: 4.0000 mg | ORAL_TABLET | Freq: Three times a day (TID) | ORAL | Status: DC | PRN

## 2015-07-03 MED ORDER — METOCLOPRAMIDE HCL 10 MG PO TABS: 10.0000 mg | ORAL_TABLET | Freq: Four times a day (QID) | ORAL | Status: DC | PRN

## 2015-07-03 MED ORDER — INSULIN ASPART 100 UNIT/ML ~~LOC~~ SOLN: 0.0000 [IU] | Freq: Every day | SUBCUTANEOUS | Status: DC

## 2015-07-03 MED ORDER — INSULIN ASPART 100 UNIT/ML ~~LOC~~ SOLN
0.0000 [IU] | Freq: Three times a day (TID) | SUBCUTANEOUS | Status: DC
  Administered 2015-07-03 (×2): 5 [IU] via SUBCUTANEOUS

## 2015-07-03 NOTE — Discharge Instructions (Signed)
Diabetic Ketoacidosis °Diabetic ketoacidosis is a life-threatening complication of diabetes. If it is not treated, it can cause severe dehydration and organ damage and can lead to a coma or death. °CAUSES °This condition develops when there is not enough of the hormone insulin in the body. Insulin helps the body to break down sugar for energy. Without insulin, the body cannot break down sugar, so it breaks down fats instead. This leads to the production of acids that are called ketones. Ketones are poisonous at high levels. °This condition can be triggered by: °· Stress on the body that is brought on by an illness. °· Medicines that raise blood glucose levels. °· Not taking diabetes medicine. °SYMPTOMS °Symptoms of this condition include: °· Fatigue. °· Weight loss. °· Excessive thirst. °· Light-headedness. °· Fruity or sweet-smelling breath. °· Excessive urination. °· Vision changes. °· Confusion or irritability. °· Nausea. °· Vomiting. °· Rapid breathing. °· Abdominal pain. °· Feeling flushed. °DIAGNOSIS °This condition is diagnosed based on a medical history, a physical exam, and blood tests. You may also have a urine test that checks for ketones. °TREATMENT °This condition may be treated with: °· Fluid replacement. This may be done to correct dehydration. °· Insulin injections. These may be given through the skin or through an IV tube. °· Electrolyte replacement. Electrolytes, such as potassium and sodium, may be given in pill form or through an IV tube. °· Antibiotic medicines. These may be prescribed if your condition was caused by an infection. °HOME CARE INSTRUCTIONS °Eating and Drinking °· Drink enough fluids to keep your urine clear or pale yellow. °· If you cannot eat, alternate between drinking fluids with sugar (such as juice) and salty fluids (such as broth or bouillon). °· If you can eat, follow your usual diet and drink sugar-free liquids, such as water. °Other Instructions °· Take insulin as  directed by your health care provider. Do not skip insulin injections. Do not use expired insulin. °· If your blood sugar is over 240 mg/dL, monitor your urine ketones every 4-6 hours. °· If you were prescribed an antibiotic medicine, finish all of it even if you start to feel better. °· Rest and exercise only as directed by your health care provider. °· If you get sick, call your health care provider and begin treatment quickly. Your body often needs extra insulin to fight an illness. °· Check your blood glucose levels regularly. If your blood glucose is high, drink plenty of fluids. This helps to flush out ketones. °SEEK MEDICAL CARE IF: °· Your blood glucose level is too high or too low. °· You have ketones in your urine. °· You have a fever. °· You cannot eat. °· You cannot tolerate fluids. °· You have been vomiting for more than 2 hours. °· You continue to have symptoms of this condition. °· You develop new symptoms. °SEEK IMMEDIATE MEDICAL CARE IF: °· Your blood glucose levels continue to be high (elevated). °· Your monitor reads "high" even when you are taking insulin. °· You faint. °· You have chest pain. °· You have trouble breathing. °· You have a sudden, severe headache. °· You have sudden weakness in one arm or one leg. °· You have sudden trouble speaking or swallowing. °· You have vomiting or diarrhea that gets worse after 3 hours. °· You feel severely fatigued. °· You have trouble thinking. °· You have abdominal pain. °· You are severely dehydrated. Symptoms of severe dehydration include: °¨ Extreme thirst. °¨ Dry mouth. °¨ Blue lips. °¨   Cold hands and feet. °¨ Rapid breathing. °  °This information is not intended to replace advice given to you by your health care provider. Make sure you discuss any questions you have with your health care provider. °  °Document Released: 06/08/2000 Document Revised: 10/26/2014 Document Reviewed: 05/19/2014 °Elsevier Interactive Patient Education ©2016 [employer]  [employer] ° °

## 2015-07-03 NOTE — Discharge Summary (Signed)
Physician Discharge Summary  [name] QVZ:[id number] DOB: Nov 29, 1965 DOA: 06/29/2015  PCP: Barbette Merino, MD  Admit date: 06/29/2015 Discharge date: 07/03/2015  Recommendations for Outpatient Follow-up:  1. Check CBC and BMP during next visit with PCP  Discharge Diagnoses:  Principal Problem:   Diabetic ketoacidosis without coma associated with type 1 diabetes mellitus ([place]) Active Problems:   High anion gap metabolic acidosis   Nausea & vomiting   Uncontrolled type 2 diabetes mellitus with diabetic neuropathy, with long-term current use of insulin (HCC)   Diabetic gastroparesis (HCC)   CKD (chronic kidney disease) stage 3, GFR 30-59 ml/min   Anemia of chronic kidney failure   Anxiety and depression    Discharge Condition: stable   Diet recommendation: as tolerated   History of present illness:  50 y.o. female with past medical history of type 1 diabetes on insulin (non-compliance with insulin), multiple admission (11 admissions in 2016) for DKA and/or gastroparesis, history of diabetic neuropathy, anxiety and depression who presented to [facility] ED with diffuse abdominal pain, nausea and vomiting started about 1 day prior to this admission.   In ED, BP was 140/67, HR 102, RR 22. Blood work showed hemoglobin of 9.4, creatinine 1.18, CO2 13, glucose 668, anion gap 23. UA showed ketones and glucose of more than 1000. She was started on insulin drip and admitted to SDU.  Transferred to telemetry 06/30/2015.  Hospital Course:   Assessment/Plan:    Principal Problem:  Diabetic ketoacidosis without coma associated with type 1 diabetes mellitus (HCC) / High anion gap metabolic acidosis / Uncontrolled type 2 diabetes mellitus with diabetic neuropathy, with long-term current use of insulin (HCC) - DKA criteria were met on admission with CBG 567, glucose on BMP 668, ketone in urine, AG of 23. Ketones were present on urinalysis - Patient off of insulin drip from 06/30/2015 - Transferred to  telemetry floor 06/30/2015 - A1c in November 2016 was 7.4. - Resume home regimen insulin   Active Problems:  Nausea & vomiting / Diabetic gastroparesis (HCC) - Continue phenergan and Reglan - Stable - Tolerates solid diet    CKD (chronic kidney disease) stage 3, GFR 30-59 ml/min - Baseline Cr 1.4 and on this admission Cr within baseline values   Anemia of chronic kidney failure - Hemoglobin is stable   Anxiety and depression - Continue prozac  - Stable   Severe protein calorie malnutrition - In the context of chronic illness - Seen by dietician   DVT Prophylaxis  - SCD's bilaterally    Code Status: Full.  Family Communication: plan of care discussed with the patient   IV access:  Peripheral IV  Procedures and diagnostic studies:   No results found.  Medical Consultants:  None   Other Consultants:  Nutrition DM coordinator   IAnti-Infectives:   None    Signed:  Leisa Lenz, MD  [facility] 07/03/2015, 9:17 AM  Pager #: [phone]  Time spent in minutes: more than 30 minutes   Discharge Exam: Filed Vitals:   07/02/15 2045 07/03/15 0411  BP: 102/56 144/58  Pulse: 91 93  Temp: 98.2 F (36.8 C) 98 F (36.7 C)  Resp: 15 16   Filed Vitals:   07/02/15 1444 07/02/15 1521 07/02/15 2045 07/03/15 0411  BP: 82/50 90/45 102/56 144/58  Pulse: 93 86 91 93  Temp: 98.3 F (36.8 C)  98.2 F (36.8 C) 98 F (36.7 C)  TempSrc: Oral  Oral Oral  Resp: 12  15 16   Height:  Weight:      SpO2: 100%  100% 100%    General: Pt is alert, follows commands appropriately, not in acute distress Cardiovascular: Regular rate and rhythm, S1/S2 +, no murmurs Respiratory: Clear to auscultation bilaterally, no wheezing, no crackles, no rhonchi Abdominal: Soft, non tender, non distended, bowel sounds +, no guarding Extremities: no edema, no cyanosis, pulses palpable bilaterally DP and PT Neuro: Grossly nonfocal  Discharge  Instructions  Discharge Instructions    Call MD for:  difficulty breathing, headache or visual disturbances    Complete by:  As directed      Call MD for:  persistant dizziness or light-headedness    Complete by:  As directed      Call MD for:  persistant nausea and vomiting    Complete by:  As directed      Call MD for:  severe uncontrolled pain    Complete by:  As directed      Diet - low sodium heart healthy    Complete by:  As directed      Increase activity slowly    Complete by:  As directed             Medication List    STOP taking these medications        promethazine 25 MG suppository  Commonly known as:  PHENERGAN      TAKE these medications        ALPRAZolam 0.25 MG tablet  Commonly known as:  XANAX  Take 1 tablet (0.25 mg total) by mouth at bedtime as needed for sleep.     atorvastatin 20 MG tablet  Commonly known as:  LIPITOR  Take 1 tablet (20 mg total) by mouth daily at 6 PM.     dronabinol 5 MG capsule  Commonly known as:  MARINOL  Take 5 mg by mouth 2 (two) times daily.     FLUoxetine 40 MG capsule  Commonly known as:  PROZAC  Take 40 mg by mouth daily.     furosemide 40 MG tablet  Commonly known as:  LASIX  Take 40 mg by mouth 2 (two) times daily as needed for fluid. For fluid retention     insulin aspart 100 UNIT/ML injection  Commonly known as:  novoLOG  Inject 0-15 Units into the skin 3 (three) times daily with meals. 2-15 units three times daily with food. Per sliding scale     insulin detemir 100 UNIT/ML injection  Commonly known as:  LEVEMIR  Inject 0.15 mLs (15 Units total) into the skin at bedtime.     lisinopril 5 MG tablet  Commonly known as:  PRINIVIL,ZESTRIL  Take 1 tablet (5 mg total) by mouth daily.     metoCLOPramide 10 MG tablet  Commonly known as:  REGLAN  Take 1 tablet (10 mg total) by mouth every 6 (six) hours as needed for nausea.     ondansetron 4 MG disintegrating tablet  Commonly known as:  ZOFRAN ODT  Take 1  tablet (4 mg total) by mouth every 8 (eight) hours as needed for nausea.     pantoprazole 40 MG tablet  Commonly known as:  PROTONIX  Take 40 mg by mouth daily.     polyethylene glycol powder powder  Commonly known as:  GLYCOLAX/MIRALAX  Take 17 g by mouth daily as needed. For constipation     pregabalin 150 MG capsule  Commonly known as:  LYRICA  Take 1 capsule (150 mg total) by mouth 2 (  two) times daily.           Follow-up Information    Follow up with [facility] On 07/11/2015.   Why:  [facility] appointment on 07/11/15 at 2:00 pm with Dr. Jarold Song.   Contact information:   [address] [place] [address] [phone]      Follow up with Barbette Merino, MD. Schedule an appointment as soon as possible for a visit in 1 week.   Specialty:  Internal Medicine   Why:  Follow up appt after recent hospitalization   Contact information:   [place] [place] [address] [phone]        The results of significant diagnostics from this hospitalization (including imaging, microbiology, ancillary and laboratory) are listed below for reference.    Significant Diagnostic Studies: Dg Abd 1 View  06/16/2015  CLINICAL DATA:  Generalized abdominal pain. History of gastroparesis. EXAM: ABDOMEN - 1 VIEW COMPARISON:  05/31/2015 FINDINGS: Supine images of the abdomen and pelvis were obtained. Moderate stool burden throughout the abdomen and pelvis. Small amount of gas in the stomach. Limited evaluation for free air on this supine examination. Nonobstructive bowel gas pattern. No large abdominal calcifications. Cholecystectomy clips in the right abdomen. IMPRESSION: Moderate stool burden in the abdomen and pelvis. Nonobstructive bowel gas pattern. Electronically Signed   By: Markus Daft M.D.   On: 06/16/2015 13:49   Dg Chest Port 1 View  06/18/2015  CLINICAL DATA:  Adjustment of central line. EXAM: PORTABLE CHEST 1 VIEW  COMPARISON:  Yesterday. FINDINGS: The right IJ catheter tip is at the cavoatrial junction. The heart size appears normal. There is no pleural effusion or edema. No airspace consolidation identified. IMPRESSION: 1. No acute cardiopulmonary abnormalities. 2. Status post right IJ catheter retraction. Tip is now at the superior cavoatrial junction. Electronically Signed   By: Kerby Moors M.D.   On: 06/18/2015 14:34   Dg Chest Port 1 View  06/17/2015  CLINICAL DATA:  Central line placement EXAM: PORTABLE CHEST 1 VIEW COMPARISON:  May 23, 2015 FINDINGS: The heart size and mediastinal contours are within normal limits. Right jugular central venous line is identified with distal tip in the right atrium. Both lungs are clear. There is no pneumothorax. The visualized skeletal structures are unremarkable. IMPRESSION: No active cardial disease. Right jugular central venous line with distal tip in the right atrium. There is no pneumothorax. Electronically Signed   By: Abelardo Diesel M.D.   On: 06/17/2015 21:15    Microbiology: No results found for this or any previous visit (from the past 240 hour(s)).   Labs: Basic Metabolic Panel:  Recent Labs Lab 06/29/15 1519 06/29/15 2122 06/30/15 0120 06/30/15 0743 06/30/15 1534 07/01/15 2200  NA 134* 142 143 138 139  --   K 4.0 3.6 3.7 3.6 4.8  --   CL 98* 112* 114* 106 109  --   CO2 13* 19* 14* 19* 20*  --   GLUCOSE 668* 156* 122* 245* 57* 467*  BUN 15 13 13 12 11   --   CREATININE 1.18* 0.83 0.84 0.94 0.97  --   CALCIUM 9.2 8.8* 8.8* 9.0 9.2  --    Liver Function Tests:  Recent Labs Lab 06/30/15 0120  AST 17  ALT 16  ALKPHOS 67  BILITOT 1.0  PROT 6.3*  ALBUMIN 3.2*   No results for input(s): LIPASE, AMYLASE in the last 168 hours. No results for input(s): AMMONIA in the last 168  hours. CBC:  Recent Labs Lab 06/29/15 1519 06/30/15 0120  WBC 8.3 7.9  HGB 9.4* 7.7*  HCT 29.8* 24.1*  MCV 84.4 82.5  PLT 459* 370   Cardiac  Enzymes: No results for input(s): CKTOTAL, CKMB, CKMBINDEX, TROPONINI in the last 168 hours. BNP: BNP (last 3 results) No results for input(s): BNP in the last 8760 hours.  ProBNP (last 3 results) No results for input(s): PROBNP in the last 8760 hours.  CBG:  Recent Labs Lab 07/02/15 1748 07/02/15 2020 07/03/15 0004 07/03/15 0400 07/03/15 0721  GLUCAP 132* 277* 167* 169* 226*

## 2015-07-03 NOTE — Progress Notes (Signed)
Discharged from floor via w/c, belongings with pt. To meet son at door. No changes in assessment. August Gosser, [employer]

## 2015-07-04 ENCOUNTER — Telehealth: Payer: Self-pay

## 2015-07-04 NOTE — Telephone Encounter (Signed)
[facility] Post-discharge Follow-Up Phone Call:  Attempt # 1 Principal Discharge Diagnosis(es):  DKA Post-discharge Communication: (Clearly document all attempts clearly and date contact made)   Call placed to the patient # [phone] and a HIPAA compliant voice mail message was left requesting a call back to # [phone]. Call then placed to the member's son, [name] # [phone], and he stated that he was not with his mother and the best # to use to reach her is her cell #.  The patient had  informed Yvette Hurt, RN CM that [name] is her emergency contact Call Completed: No

## 2015-07-05 ENCOUNTER — Encounter (HOSPITAL_COMMUNITY): Payer: Self-pay

## 2015-07-05 ENCOUNTER — Telehealth: Payer: Self-pay

## 2015-07-05 ENCOUNTER — Emergency Department (HOSPITAL_COMMUNITY)
Admission: EM | Admit: 2015-07-05 | Discharge: 2015-07-05 | Disposition: A | Discharge status: Home / Self Care | Payer: 59 | Attending: Emergency Medicine | Admitting: Emergency Medicine

## 2015-07-05 DIAGNOSIS — Z794 Long term (current) use of insulin: Secondary | ICD-10-CM | POA: Insufficient documentation

## 2015-07-05 DIAGNOSIS — K3184 Gastroparesis: Secondary | ICD-10-CM

## 2015-07-05 DIAGNOSIS — Z79899 Other long term (current) drug therapy: Secondary | ICD-10-CM | POA: Insufficient documentation

## 2015-07-05 DIAGNOSIS — Z880 Allergy status to penicillin: Secondary | ICD-10-CM | POA: Insufficient documentation

## 2015-07-05 DIAGNOSIS — Z3202 Encounter for pregnancy test, result negative: Secondary | ICD-10-CM | POA: Insufficient documentation

## 2015-07-05 DIAGNOSIS — E1143 Type 2 diabetes mellitus with diabetic autonomic (poly)neuropathy: Secondary | ICD-10-CM

## 2015-07-05 LAB — CBC WITH DIFFERENTIAL/PLATELET
Basophils Absolute: 0 10*3/uL (ref 0.0–0.1)
Basophils Relative: 1 %
EOS ABS: 0.1 10*3/uL (ref 0.0–0.7)
Eosinophils Relative: 1 %
HCT: 31.6 % — ABNORMAL LOW (ref 36.0–46.0)
HEMOGLOBIN: 10 g/dL — AB (ref 12.0–15.0)
LYMPHS ABS: 0.6 10*3/uL — AB (ref 0.7–4.0)
Lymphocytes Relative: 9 %
MCH: 26.2 pg (ref 26.0–34.0)
MCHC: 31.6 g/dL (ref 30.0–36.0)
MCV: 82.9 fL (ref 78.0–100.0)
MONOS PCT: 9 %
Monocytes Absolute: 0.5 10*3/uL (ref 0.1–1.0)
NEUTROS PCT: 80 %
Neutro Abs: 5 10*3/uL (ref 1.7–7.7)
Platelets: 442 10*3/uL — ABNORMAL HIGH (ref 150–400)
RBC: 3.81 MIL/uL — ABNORMAL LOW (ref 3.87–5.11)
RDW: 16.3 % — ABNORMAL HIGH (ref 11.5–15.5)
WBC: 6.2 10*3/uL (ref 4.0–10.5)

## 2015-07-05 LAB — COMPREHENSIVE METABOLIC PANEL
ALT: 21 U/L (ref 14–54)
AST: 29 U/L (ref 15–41)
Albumin: 3.6 g/dL (ref 3.5–5.0)
Alkaline Phosphatase: 91 U/L (ref 38–126)
Anion gap: 14 (ref 5–15)
BUN: 9 mg/dL (ref 6–20)
CO2: 22 mmol/L (ref 22–32)
Calcium: 9 mg/dL (ref 8.9–10.3)
Chloride: 99 mmol/L — ABNORMAL LOW (ref 101–111)
Creatinine, Ser: 0.92 mg/dL (ref 0.44–1.00)
GFR calc Af Amer: >60 mL/min (ref >60)
GFR calc non Af Amer: >60 mL/min (ref >60)
Glucose, Bld: 396 mg/dL — ABNORMAL HIGH (ref 65–99)
Potassium: 3.7 mmol/L (ref 3.5–5.1)
Sodium: 135 mmol/L (ref 135–145)
Total Bilirubin: 1 mg/dL (ref 0.3–1.2)
Total Protein: 7.1 g/dL (ref 6.5–8.1)

## 2015-07-05 LAB — I-STAT CHEM 8, ED
BUN: 10 mg/dL (ref 6–20)
CALCIUM ION: 1.13 mmol/L (ref 1.12–1.23)
CREATININE: 0.6 mg/dL (ref 0.44–1.00)
Chloride: 98 mmol/L — ABNORMAL LOW (ref 101–111)
GLUCOSE: 393 mg/dL — AB (ref 65–99)
HCT: 34 % — ABNORMAL LOW (ref 36.0–46.0)
Hemoglobin: 11.6 g/dL — ABNORMAL LOW (ref 12.0–15.0)
Potassium: 3.8 mmol/L (ref 3.5–5.1)
SODIUM: 135 mmol/L (ref 135–145)
TCO2: 24 mmol/L (ref 0–100)

## 2015-07-05 LAB — URINALYSIS, ROUTINE W REFLEX MICROSCOPIC
Bilirubin Urine: NEGATIVE
Glucose, UA: >1000 mg/dL — AB
Hgb urine dipstick: NEGATIVE
Ketones, ur: >80 mg/dL — AB
Leukocytes, UA: NEGATIVE
Nitrite: NEGATIVE
Protein, ur: NEGATIVE mg/dL
Specific Gravity, Urine: 1.02 (ref 1.005–1.030)
pH: 6 (ref 5.0–8.0)

## 2015-07-05 LAB — URINE MICROSCOPIC-ADD ON
RBC / HPF: NONE SEEN RBC/hpf (ref 0–5)
WBC UA: NONE SEEN WBC/hpf (ref 0–5)

## 2015-07-05 LAB — LIPASE, BLOOD: Lipase: 272 U/L — ABNORMAL HIGH (ref 11–51)

## 2015-07-05 LAB — POC URINE PREG, ED: Preg Test, Ur: NEGATIVE

## 2015-07-05 LAB — CBG MONITORING, ED
Glucose-Capillary: 349 mg/dL — ABNORMAL HIGH (ref 65–99)
Glucose-Capillary: 388 mg/dL — ABNORMAL HIGH (ref 65–99)

## 2015-07-05 MED ORDER — SODIUM CHLORIDE 0.9 % IV BOLUS (SEPSIS)
2000.0000 mL | Freq: Once | INTRAVENOUS | Status: AC
  Administered 2015-07-05: 2000 mL via INTRAVENOUS

## 2015-07-05 MED ORDER — METOCLOPRAMIDE HCL 5 MG/ML IJ SOLN
10.0000 mg | Freq: Once | INTRAMUSCULAR | Status: AC
  Administered 2015-07-05: 10 mg via INTRAVENOUS
  Filled 2015-07-05: qty 2

## 2015-07-05 MED ORDER — LORAZEPAM 2 MG/ML IJ SOLN
1.0000 mg | Freq: Once | INTRAMUSCULAR | Status: AC
  Administered 2015-07-05: 1 mg via INTRAVENOUS
  Filled 2015-07-05: qty 1

## 2015-07-05 MED ORDER — ONDANSETRON HCL 4 MG/2ML IJ SOLN
4.0000 mg | Freq: Once | INTRAMUSCULAR | Status: AC
  Administered 2015-07-05: 4 mg via INTRAVENOUS
  Filled 2015-07-05: qty 2

## 2015-07-05 MED ORDER — DIPHENHYDRAMINE HCL 50 MG/ML IJ SOLN
50.0000 mg | Freq: Once | INTRAMUSCULAR | Status: AC
  Administered 2015-07-05: 50 mg via INTRAVENOUS
  Filled 2015-07-05: qty 1

## 2015-07-05 NOTE — ED Notes (Signed)
CBG 388.  Dr. Venora Maples made aware.  This RN discussed with pt importance of monitoring CBG regularly and using home meds to control BG. Pt expresses understanding.

## 2015-07-05 NOTE — Discharge Instructions (Signed)
Nausea, Adult [name], see your primary care doctor within 3 days for close follow up. If symptoms worsen, come back to the ED Immediately. Thank you. Nausea means you feel sick to your stomach or need to throw up (vomit). It may be a sign of a more serious problem. If nausea gets worse, you may throw up. If you throw up a lot, you may lose too much body fluid (dehydration). HOME CARE   Get plenty of rest.  Ask your doctor how to replace body fluid losses (rehydrate).  Eat small amounts of food. Sip liquids more often.  Take all medicines as told by your doctor. GET HELP RIGHT AWAY IF:  You have a fever.  You pass out (faint).  You keep throwing up or have blood in your throw up.  You are very weak, have dry lips or a dry mouth, or you are very thirsty (dehydrated).  You have dark or bloody poop (stool).  You have very bad chest or belly (abdominal) pain.  You do not get better after 2 days, or you get worse.  You have a headache. MAKE SURE YOU:  Understand these instructions.  Will watch your condition.  Will get help right away if you are not doing well or get worse.   This information is not intended to replace advice given to you by your health care provider. Make sure you discuss any questions you have with your health care provider.   Document Released: 05/31/2011 Document Revised: 09/03/2011 Document Reviewed: 05/31/2011 Elsevier Interactive Patient Education [employer].

## 2015-07-05 NOTE — Telephone Encounter (Signed)
[facility] Post-discharge Follow-Up Phone Call:  Attempt # 2  Date of Discharge: 07/03/2015  Principal Discharge Diagnosis(es): DKA Post-discharge Communication: (Clearly document all attempts clearly and date contact made)  Call placed to the patient # [phone] and a HIPAA compliant voice mail message was left requesting a call back to # [phone] or [phone].  Call then placed to her son, Bethann Punches # [phone].  The patient had informed Carmela Hurt, RN CM that he is an emergency contact.  He informed this CM that she was in the ED last night because of vomiting. He said that she was given fluids and then sent home. He noted that she is probably sleeping and will not answer the phone.  Instructed him regarding the importance of returning the call to this CM. He said that he has the # [phone] and he would have her return the call.  Call Completed: No

## 2015-07-05 NOTE — ED Notes (Signed)
PTA received 4mg  of zofran

## 2015-07-05 NOTE — ED Notes (Addendum)
Pt comes from home via [facility], c/o n/v starting today, was recently discharged for DKA, Hx of gastroparesis CBG  of 376, pt did not take insulin today worried that she would bottom out.

## 2015-07-05 NOTE — ED Provider Notes (Signed)
CSN: [id number]     Arrival date & time    History   First MD Initiated Contact with Patient 07/05/15 0507     Chief Complaint  Patient presents with  . Nausea  . Emesis     (Consider location/radiation/quality/duration/timing/severity/associated sxs/prior Treatment) HPI  [name] is a 50 y.o. female with past OB history of diabetes, noncompliance, gastroparesis presenting today with nausea and vomiting. History is difficult to obtain due to the acuity of patient's condition. She states she has had irretractable nausea and vomiting. She did not take her insulin this morning because she was scared that her blood sugar go too low because she has not been eating. This feels like her typical nausea /vomiting. No further history could be obtained due to active dry heaving in the room. She does have a history of DKA as well.   Past Medical History  Diagnosis Date  . Diabetes mellitus without complication [facility])    Past Surgical History  Procedure Laterality Date  . Retinal detachment surgery    . Posterior vitrectomy and membrane peel-left eye  09/28/2002  . Posterior vitrectomy and membrane peel-right eye  03/16/2002  . Eye surgery    . Gailstones    . Esophagogastroduodenoscopy  09/27/2014    at [facility], Dr Rolan Lipa. biospy neg for celiac, neg for H pylori.   . Colonoscopy  09/27/2014    at [facility]   Family History  Problem Relation Age of Onset  . Cystic fibrosis Mother   . Hypertension Father   . Diabetes Brother   . Hypertension Maternal Grandmother    Social History  Substance Use Topics  . Smoking status: Never Smoker   . Smokeless tobacco: Never Used  . Alcohol Use: No   OB History    No data available     Review of Systems  Unable to perform ROS: Acuity of condition      Allergies  Anesthetics, amide; Penicillins; Buprenorphine hcl; and Encainide  Home Medications   Prior to Admission medications   Medication Sig Start Date End Date Taking?  Authorizing Provider  ALPRAZolam (XANAX) 0.25 MG tablet Take 1 tablet (0.25 mg total) by mouth at bedtime as needed for sleep. Patient taking differently: Take 0.25 mg by mouth 2 (two) times daily as needed for anxiety or sleep.  06/25/15  Yes Allie Bossier, MD  atorvastatin (LIPITOR) 20 MG tablet Take 1 tablet (20 mg total) by mouth daily at 6 PM. 06/25/15  Yes Allie Bossier, MD  dronabinol (MARINOL) 5 MG capsule Take 5 mg by mouth 2 (two) times daily. 05/16/15  Yes Historical Provider, MD  FLUoxetine (PROZAC) 40 MG capsule Take 40 mg by mouth daily.   Yes Historical Provider, MD  furosemide (LASIX) 40 MG tablet Take 40 mg by mouth 2 (two) times daily as needed for fluid. For fluid retention 03/29/15  Yes Historical Provider, MD  insulin aspart (NOVOLOG) 100 UNIT/ML injection Inject 0-15 Units into the skin 3 (three) times daily with meals. 2-15 units three times daily with food. Per sliding scale 06/25/15  Yes Allie Bossier, MD  insulin detemir (LEVEMIR) 100 UNIT/ML injection Inject 0.15 mLs (15 Units total) into the skin at bedtime. 06/25/15  Yes Allie Bossier, MD  lisinopril (PRINIVIL,ZESTRIL) 5 MG tablet Take 1 tablet (5 mg total) by mouth daily. 06/25/15  Yes Allie Bossier, MD  ondansetron (ZOFRAN ODT) 4 MG disintegrating tablet Take 1 tablet (4 mg total) by mouth every 8 (eight)  hours as needed for nausea. 07/03/15  Yes Robbie Lis, MD  pantoprazole (PROTONIX) 40 MG tablet Take 40 mg by mouth daily.  01/04/15  Yes Historical Provider, MD  polyethylene glycol powder (GLYCOLAX/MIRALAX) powder Take 17 g by mouth daily as needed. For constipation 04/06/15  Yes Historical Provider, MD  pregabalin (LYRICA) 150 MG capsule Take 1 capsule (150 mg total) by mouth 2 (two) times daily. 02/17/13  Yes Robbie Lis, MD  metoCLOPramide (REGLAN) 10 MG tablet Take 1 tablet (10 mg total) by mouth every 6 (six) hours as needed for nausea. Patient not taking: Reported on 07/05/2015 07/03/15   Robbie Lis, MD    BP 172/116 mmHg  Pulse 110  Temp(Src) 97.5 F (36.4 C) (Axillary)  Resp 16  Ht 5\' 2"  (1.575 m)  Wt 120 lb (54.432 kg)  BMI 21.94 kg/m2  SpO2 100%  LMP 06/26/2015 Physical Exam  Constitutional: She is oriented to person, place, and time. She appears well-developed and well-nourished. No distress.  Dry heaving  HENT:  Head: Normocephalic and atraumatic.  Nose: Nose normal.  Mouth/Throat: Oropharynx is clear and moist. No oropharyngeal exudate.  Eyes: Conjunctivae and EOM are normal. Pupils are equal, round, and reactive to light. No scleral icterus.  Neck: Normal range of motion. Neck supple. No JVD present. No tracheal deviation present. No thyromegaly present.  Cardiovascular: Regular rhythm and normal heart sounds.  Exam reveals no gallop and no friction rub.   No murmur heard. Tachycardic  Pulmonary/Chest: Effort normal and breath sounds normal. No respiratory distress. She has no wheezes. She exhibits no tenderness.  Abdominal: Soft. Bowel sounds are normal. She exhibits no distension and no mass. There is no tenderness. There is no rebound and no guarding.  Musculoskeletal: Normal range of motion. She exhibits no edema or tenderness.  Lymphadenopathy:    She has no cervical adenopathy.  Neurological: She is alert and oriented to person, place, and time. No cranial nerve deficit. She exhibits normal muscle tone.  Skin: Skin is warm and dry. No rash noted. No erythema. No pallor.  Nursing note and vitals reviewed.   ED Course  Procedures (including critical care time) Labs Review Labs Reviewed  CBC WITH DIFFERENTIAL/PLATELET - Abnormal; Notable for the following:    RBC 3.81 (*)    Hemoglobin 10.0 (*)    HCT 31.6 (*)    RDW 16.3 (*)    Platelets 442 (*)    Lymphs Abs 0.6 (*)    All other components within normal limits  LIPASE, BLOOD - Abnormal; Notable for the following:    Lipase 272 (*)    All other components within normal limits  COMPREHENSIVE METABOLIC PANEL  - Abnormal; Notable for the following:    Chloride 99 (*)    Glucose, Bld 396 (*)    All other components within normal limits  URINALYSIS, ROUTINE W REFLEX MICROSCOPIC (NOT AT [facility]) - Abnormal; Notable for the following:    Glucose, UA >1000 (*)    Ketones, ur >80 (*)    All other components within normal limits  URINE MICROSCOPIC-ADD ON - Abnormal; Notable for the following:    Squamous Epithelial / LPF 0-5 (*)    Bacteria, UA RARE (*)    All other components within normal limits  CBG MONITORING, ED - Abnormal; Notable for the following:    Glucose-Capillary 349 (*)    All other components within normal limits  I-STAT CHEM 8, ED - Abnormal; Notable for the following:  Chloride 98 (*)    Glucose, Bld 393 (*)    Hemoglobin 11.6 (*)    HCT 34.0 (*)    All other components within normal limits  POC URINE PREG, ED    Imaging Review No results found. I have personally reviewed and evaluated these images and lab results as part of my medical decision-making.   EKG Interpretation None      MDM   Final diagnoses:  None   patient presents to the emergency department for nausea and vomiting. She appears in acute distress. She is given 2 L of IV fluids, Benadryl, Ativan, Reglan for her nausea. Upon repeat evaluation, patient found resting comfortably in the room and in no acute distress. Repeat heart rate is 104. Urinalysis is still pending. She states her nausea has improved.  AFter fluid resuscitation, patient will be able to go home.  There is no anion gap.  Advised to resume insulin with meals.  She appears well and in NAD.  VS remain within her normal limits and she is safe for DC.  UA negative for infection and pregnancy, patient still comfortable.  Tachycardia resolved.  OK for DC.      Everlene Balls, MD 07/05/15 [phone]

## 2015-07-06 ENCOUNTER — Telehealth: Payer: Self-pay

## 2015-07-06 NOTE — Telephone Encounter (Signed)
Attempted to contact the patient to check on her status.   Call placed to # [phone] and # [phone] and HIPAA compliant voice mail messages were left on each number requesting a call back to # [phone] or [phone].

## 2015-07-07 ENCOUNTER — Telehealth: Payer: Self-pay

## 2015-07-07 NOTE — Telephone Encounter (Signed)
[facility] Post-discharge Follow-Up Phone Call:  Date of Discharge: 07/03/15 Principal Discharge Diagnosis(es): DKA Post-discharge Communication: Attempt #3 to reach patient and complete discharge follow-up phone call.  Call placed to [phone]; unable to reach patient. HIPPA compliant voicemail left requesting return call. In addition, call placed to [phone]. HIPPA compliant message with patient's son, Kasandra Knudsen, to have patient return call. Call Completed: No

## 2015-07-08 ENCOUNTER — Telehealth: Payer: Self-pay

## 2015-07-08 NOTE — Telephone Encounter (Signed)
[facility] Post-discharge Follow-Up Phone Call:  Date of Discharge: 07/03/15 Principal Discharge Diagnosis(es): DKA Post-discharge Communication: Attempt #4 to reach patient and complete discharge follow-up phone call.  Call placed to [phone]; unable to reach patient. HIPPA compliant voicemail left requesting return call. Awaiting return call from patient. Call completed: No

## 2015-07-11 ENCOUNTER — Inpatient Hospital Stay: Payer: 59 | Admitting: Family Medicine

## 2015-07-11 ENCOUNTER — Emergency Department (HOSPITAL_COMMUNITY)
Admission: EM | Admit: 2015-07-11 | Discharge: 2015-07-11 | Discharge status: Against Medical Advice | Payer: 59 | Attending: Emergency Medicine | Admitting: Emergency Medicine

## 2015-07-11 ENCOUNTER — Encounter (HOSPITAL_COMMUNITY): Payer: Self-pay | Admitting: *Deleted

## 2015-07-11 DIAGNOSIS — E1065 Type 1 diabetes mellitus with hyperglycemia: Secondary | ICD-10-CM | POA: Insufficient documentation

## 2015-07-11 DIAGNOSIS — K3184 Gastroparesis: Secondary | ICD-10-CM | POA: Insufficient documentation

## 2015-07-11 DIAGNOSIS — Z880 Allergy status to penicillin: Secondary | ICD-10-CM | POA: Insufficient documentation

## 2015-07-11 DIAGNOSIS — Z79899 Other long term (current) drug therapy: Secondary | ICD-10-CM | POA: Insufficient documentation

## 2015-07-11 DIAGNOSIS — R739 Hyperglycemia, unspecified: Secondary | ICD-10-CM

## 2015-07-11 DIAGNOSIS — E1010 Type 1 diabetes mellitus with ketoacidosis without coma: Secondary | ICD-10-CM | POA: Insufficient documentation

## 2015-07-11 DIAGNOSIS — Z794 Long term (current) use of insulin: Secondary | ICD-10-CM | POA: Insufficient documentation

## 2015-07-11 LAB — CBG MONITORING, ED
GLUCOSE-CAPILLARY: 133 mg/dL — AB (ref 65–99)
GLUCOSE-CAPILLARY: 73 mg/dL (ref 65–99)
Glucose-Capillary: 276 mg/dL — ABNORMAL HIGH (ref 65–99)
Glucose-Capillary: 61 mg/dL — ABNORMAL LOW (ref 65–99)
Glucose-Capillary: 34 mg/dL — CL (ref 65–99)

## 2015-07-11 LAB — CBC WITH DIFFERENTIAL/PLATELET
BASOS ABS: 0 10*3/uL (ref 0.0–0.1)
BASOS PCT: 0 %
Eosinophils Absolute: 0 10*3/uL (ref 0.0–0.7)
Eosinophils Relative: 1 %
HEMATOCRIT: 36.1 % (ref 36.0–46.0)
HEMOGLOBIN: 11.3 g/dL — AB (ref 12.0–15.0)
Lymphocytes Relative: 37 %
Lymphs Abs: 1 10*3/uL (ref 0.7–4.0)
MCH: 25.7 pg — ABNORMAL LOW (ref 26.0–34.0)
MCHC: 31.3 g/dL (ref 30.0–36.0)
MCV: 82.2 fL (ref 78.0–100.0)
Monocytes Absolute: 0.2 10*3/uL (ref 0.1–1.0)
Monocytes Relative: 7 %
NEUTROS ABS: 1.6 10*3/uL — AB (ref 1.7–7.7)
NEUTROS PCT: 55 %
Platelets: 356 10*3/uL (ref 150–400)
RBC: 4.39 MIL/uL (ref 3.87–5.11)
RDW: 15.9 % — ABNORMAL HIGH (ref 11.5–15.5)
WBC: 2.8 10*3/uL — AB (ref 4.0–10.5)

## 2015-07-11 LAB — BASIC METABOLIC PANEL
ANION GAP: 21 — AB (ref 5–15)
BUN: 10 mg/dL (ref 6–20)
CHLORIDE: 99 mmol/L — AB (ref 101–111)
CO2: 17 mmol/L — AB (ref 22–32)
Calcium: 9.8 mg/dL (ref 8.9–10.3)
Creatinine, Ser: 0.92 mg/dL (ref 0.44–1.00)
GFR calc non Af Amer: >60 mL/min (ref >60)
Glucose, Bld: 286 mg/dL — ABNORMAL HIGH (ref 65–99)
Potassium: 4.1 mmol/L (ref 3.5–5.1)
Sodium: 137 mmol/L (ref 135–145)

## 2015-07-11 MED ORDER — SODIUM CHLORIDE 0.9 % IV BOLUS (SEPSIS)
1000.0000 mL | Freq: Once | INTRAVENOUS | Status: AC
  Administered 2015-07-11: 1000 mL via INTRAVENOUS

## 2015-07-11 MED ORDER — PROMETHAZINE HCL 25 MG/ML IJ SOLN
25.0000 mg | Freq: Once | INTRAMUSCULAR | Status: AC
  Administered 2015-07-11: 25 mg via INTRAVENOUS
  Filled 2015-07-11: qty 1

## 2015-07-11 MED ORDER — PROMETHAZINE HCL 25 MG/ML IJ SOLN
25.0000 mg | Freq: Once | INTRAMUSCULAR | Status: AC
  Administered 2015-07-11: 25 mg via INTRAMUSCULAR
  Filled 2015-07-11: qty 1

## 2015-07-11 MED ORDER — DEXTROSE 10 % IV BOLUS
10.0000 mL/kg | Freq: Once | INTRAVENOUS | Status: DC
  Filled 2015-07-11: qty 1000

## 2015-07-11 MED ORDER — DEXTROSE 50 % IV SOLN
25.0000 mL | Freq: Once | INTRAVENOUS | Status: DC
  Filled 2015-07-11: qty 50

## 2015-07-11 MED ORDER — DIPHENHYDRAMINE HCL 50 MG/ML IJ SOLN: 12.5000 mg | Freq: Once | INTRAMUSCULAR | Status: DC

## 2015-07-11 MED ORDER — METOCLOPRAMIDE HCL 5 MG/ML IJ SOLN: 10.0000 mg | Freq: Once | INTRAMUSCULAR | Status: DC

## 2015-07-11 MED ORDER — DEXTROSE 5 % IV BOLUS: 250.0000 mL | Freq: Once | INTRAVENOUS | Status: DC

## 2015-07-11 NOTE — ED Notes (Signed)
Patient given 1/2 water and encouraged heavily to sit up and drink.

## 2015-07-11 NOTE — Discharge Instructions (Signed)
You have been seen today for hyperglycemia. This issue is corrected here in the ED. Follow up with PCP as soon as possible for repeat evaluation. Return to ED should symptoms worsen. You have left the ED today AGAINST MEDICAL ADVICE. It was recommended that you stay until your blood sugar was stable above 100. These instructions were communicated to, you voiced understanding, and still chose to leave. If you change your mind, you are always welcome to return. Please make sure that you are watched closely until your appointment with your PCP to assure that no drops in blood sugar occur.

## 2015-07-11 NOTE — Hospital Discharge Follow-Up (Signed)
The patient had been referred to the [facility] ( [facility]) at the [facility] after her recent hospitalization; but she has not responded to any phone calls from the clinic.    Met with her today along with Joellyn Quails, RN CM and discussed scheduling another follow up appointment at the [facility].  She was scheduled for an appointment this afternoon but will not be able to keep the appointment due to being in the ED. Discussed the importance of medical follow up and she agreed to schedule another appointment.  An appointment was then scheduled for 07/14/15 @ 1100 with Dr Jarold Song at the [name] and the information was placed on the AVS.  Explained to her the services that are provided at the [facility], including social work, pharmacy assistance and financial counseling ( [employer]) as well as assistance with transportation to her appointments if needed.   She was very appreciative of the information.   She said that she had been receiving short term disability through October 2016 but was informed by Christella Scheuermann that information was needed from her doctor and because it was not received , her case was closed. Joellyn Quails, RN CM provided her with the forms for release of information from her physician as well as the hospital. The patient stated that she will also need to follow up with her former employer. She is not sure if she is still eligible for disability.  She said that her phone has been turned off; but when she leaves the hospital today, she will go to pay the bill and it will be turned on again. She confirmed that her number is # [phone].  She also noted that her son , Kasandra Knudsen, can be reached at his same number.   She had no other questions at this time.

## 2015-07-11 NOTE — ED Notes (Signed)
Case management at bedside.

## 2015-07-11 NOTE — ED Notes (Signed)
1 unsuccessful attempt to start and IV and draw blood.

## 2015-07-11 NOTE — ED Notes (Signed)
IV team at bedside 

## 2015-07-11 NOTE — ED Notes (Signed)
Patient changing clothes in the restroom

## 2015-07-11 NOTE — ED Notes (Signed)
Discharge instructions and follow up care reviewed with patient. Patient verbalized understanding. Shaun, PA, reviewed with patient about leaving AMA. Patient verbalized understanding and stated that she has to leave because her son has to go to work and she does not want to ride the bus.

## 2015-07-11 NOTE — ED Notes (Signed)
Per EMS report: pt from home: pt c/o abd pain, N/v/d all day.  Pt took PO zofran without relief.  EMS did not see any emesis. EMS VS: BP: 143/93, HR: 102, 98% RA,  RR: 20. Rates pain 10/10.  CBG: 330 Pt a/o x 4 and ambulatory.

## 2015-07-11 NOTE — ED Notes (Signed)
Bed: [id number] Expected date:  Expected time:  Means of arrival:  Comments: EMS 50 yo Gastroparesis, hyperglycemia

## 2015-07-11 NOTE — Progress Notes (Signed)
Pt with 15 [facility] ED visits in the last 6 months  ED CM spoke with [facility] CM, Opal Sidles about this Pt had a scheduled 07/11/15 1400 appt with Dr Jarold Song [facility] CM would like to speak with pt

## 2015-07-11 NOTE — Progress Notes (Addendum)
Pt with 15 ED Visits and 11 hospitalizations. Pt states she has disability Still employed at this time but her company is stating they may terminate her if she is unable to provide information needed for hospitalizations/Dr Appts and is needing to get information to send to short term disability  Right now "not approve per my HR" Pt confirms "I don't have any insurance on my job" Pt states she has been out of work since March" States her short term disability via [place] has expired.   Opal Sidles came to visit pt during CM assessment Pt confirmed she was being seen by Dr Jonelle Sidle and is scheduled to be seen at Dr Jarold Song today at 1400 but does not think she would feel like going to the appt if d/c ED PA/NP updated on ED CM and [facility] CM visit with pt and attempt to establish care with [facility] Dr Jarold Song Pt informed [place] about medication needs. CM recalled pt was seen recently by this CM and had not filled her zofran suppository Rx because she had some at home Pt at this time confirms she does not prefer zofran in suppository because she is not sure if she is inserting properly Her preference is zofran ODT Pt provided with two (one for employer, one for Dr Jonelle Sidle) [facility] medical release forms and CM discussed how she can use them to either allow her employer HR to get the medical records needed or how she can get the medical records herself to send to her employer HR.  Pt voiced understanding on the process on how to obtain medical records vis teach back method   CM discussed and provided written information for uninsured accepting pcps, discussed the importance of pcp vs EDP services for f/u care, www.needymeds.org, www.goodrx.com, discounted pharmacies and other [employer] such as [employer], affordable care act, financial assistance, uninsured dental services, [place] med assist, DSS and  health department  Reviewed resources for [employer] uninsured accepting pcps like Jinny Blossom, family medicine at [employer],  [facility], [facility], local urgent care centers, [facility], [facility] family practice, general medical clinics, family services of the [place], [facility] urgent care plus others, medication resources, [facility] out patient pharmacies and housing Pt voiced understanding and appreciation of resources provided

## 2015-07-11 NOTE — ED Notes (Signed)
Bed: [facility] Expected date:  Expected time:  Means of arrival:  Comments: 

## 2015-07-11 NOTE — ED Provider Notes (Signed)
6:06 AM Took patient care handoff report from Charlann Lange, PA-C.  [name] is a 50 y.o. female, with a history of type 1 diabetes with insulin noncompliance and chronic gastroparesis, presenting to the ED with reported nausea and vomiting. Patient has hyperglycemia with anion gap of 21. Suspect that this patient will only require IV fluids while here in the ED.  Plan: Give oral fluids to rehydrate the patient's veins and then attempts ultrasound-guided IV. Give 2 L of fluid.  ROS: Nausea and vomiting with no current abdominal pain. Hyperglycemia. No fever, chills, cough, chest pain, shortness of breath, dysuria, or any other complaints. All other systems reviewed and are negative.  Physical exam: Patient is alert and oriented 4. No neurologic deficits. Lungs are clear and equal bilaterally. Heart rate is normal and regular. Heart tones are normal. Skin turgor is poor, but without pallor. Abdomen is soft and without tenderness. No cervical lymphadenopathy. No peripheral edema or pain. Peripheral pulses intact.   Results for orders placed or performed during the hospital encounter of 07/11/15  CBC with Differential/Platelet  Result Value Ref Range   WBC 2.8 (L) 4.0 - 10.5 K/uL   RBC 4.39 3.87 - 5.11 MIL/uL   Hemoglobin 11.3 (L) 12.0 - 15.0 g/dL   HCT 36.1 36.0 - 46.0 %   MCV 82.2 78.0 - 100.0 fL   MCH 25.7 (L) 26.0 - 34.0 pg   MCHC 31.3 30.0 - 36.0 g/dL   RDW 15.9 (H) 11.5 - 15.5 %   Platelets 356 150 - 400 K/uL   Neutrophils Relative % 55 %   Neutro Abs 1.6 (L) 1.7 - 7.7 K/uL   Lymphocytes Relative 37 %   Lymphs Abs 1.0 0.7 - 4.0 K/uL   Monocytes Relative 7 %   Monocytes Absolute 0.2 0.1 - 1.0 K/uL   Eosinophils Relative 1 %   Eosinophils Absolute 0.0 0.0 - 0.7 K/uL   Basophils Relative 0 %   Basophils Absolute 0.0 0.0 - 0.1 K/uL  Basic metabolic panel  Result Value Ref Range   Sodium 137 135 - 145 mmol/L   Potassium 4.1 3.5 - 5.1 mmol/L   Chloride 99 (L) 101 - 111  mmol/L   CO2 17 (L) 22 - 32 mmol/L   Glucose, Bld 286 (H) 65 - 99 mg/dL   BUN 10 6 - 20 mg/dL   Creatinine, Ser 0.92 0.44 - 1.00 mg/dL   Calcium 9.8 8.9 - 10.3 mg/dL   GFR calc non Af Amer >60 >60 mL/min   GFR calc Af Amer >60 >60 mL/min   Anion gap 21 (H) 5 - 15  CBG monitoring, ED  Result Value Ref Range   Glucose-Capillary 276 (H) 65 - 99 mg/dL  CBG monitoring, ED  Result Value Ref Range   Glucose-Capillary 133 (H) 65 - 99 mg/dL  CBG monitoring, ED  Result Value Ref Range   Glucose-Capillary 61 (L) 65 - 99 mg/dL  CBG monitoring, ED  Result Value Ref Range   Glucose-Capillary 34 (LL) 65 - 99 mg/dL  CBG monitoring, ED  Result Value Ref Range   Glucose-Capillary 73 65 - 99 mg/dL   Dg Abd 1 View  06/16/2015  CLINICAL DATA:  Generalized abdominal pain. History of gastroparesis. EXAM: ABDOMEN - 1 VIEW COMPARISON:  05/31/2015 FINDINGS: Supine images of the abdomen and pelvis were obtained. Moderate stool burden throughout the abdomen and pelvis. Small amount of gas in the stomach. Limited evaluation for free air on this supine examination. Nonobstructive  bowel gas pattern. No large abdominal calcifications. Cholecystectomy clips in the right abdomen. IMPRESSION: Moderate stool burden in the abdomen and pelvis. Nonobstructive bowel gas pattern. Electronically Signed   By: Markus Daft M.D.   On: 06/16/2015 13:49   Dg Chest Port 1 View  06/18/2015  CLINICAL DATA:  Adjustment of central line. EXAM: PORTABLE CHEST 1 VIEW COMPARISON:  Yesterday. FINDINGS: The right IJ catheter tip is at the cavoatrial junction. The heart size appears normal. There is no pleural effusion or edema. No airspace consolidation identified. IMPRESSION: 1. No acute cardiopulmonary abnormalities. 2. Status post right IJ catheter retraction. Tip is now at the superior cavoatrial junction. Electronically Signed   By: Kerby Moors M.D.   On: 06/18/2015 14:34   Dg Chest Port 1 View  06/17/2015  CLINICAL DATA:  Central  line placement EXAM: PORTABLE CHEST 1 VIEW COMPARISON:  May 23, 2015 FINDINGS: The heart size and mediastinal contours are within normal limits. Right jugular central venous line is identified with distal tip in the right atrium. Both lungs are clear. There is no pneumothorax. The visualized skeletal structures are unremarkable. IMPRESSION: No active cardial disease. Right jugular central venous line with distal tip in the right atrium. There is no pneumothorax. Electronically Signed   By: Abelardo Diesel M.D.   On: 06/17/2015 21:15    Angiocath insertion Performed by: Lorayne Bender  Consent: Verbal consent obtained. Risks and benefits: risks, benefits and alternatives were discussed Time out: Immediately prior to procedure a "time out" was called to verify the correct patient, procedure, equipment, support staff and site/side marked as required.  Preparation: Patient was prepped and draped in the usual sterile fashion.  Vein Location: Left A/C  Not Ultrasound Guided  Gauge: 22 ga  Normal blood return and flush without difficulty Patient tolerance: Patient tolerated the procedure well with no immediate complications.   Findings and Plan of Care shared with Dr. Christy Gentles and then with Dr. Eulis Foster upon EDP shift change.  Difficulty starting an IV on this patient. Patient's IV status was assessed via ultrasound at her peripheral veins and at the EJ. Difficulty with finding a suitable vein in the patient's present hydration state. Patient will be given oral fluids to improve her hydration status. Patient has been here for over 5 hours and has not vomited since she has been here in the ED. Patient's DKA status is very mild. An IV was able to be obtained. Patient to get IV fluids and then reassess. Recheck of the patient's CBG reveals that her blood sugar is now 103. Patient requested to eat stating that she feels much better. Patient has a 2:00 pm appointment with community [facility] wellness  today for diabetes evaluation and follow-up. Due to the patient's drop in blood sugar, she will have to have this appointment rescheduled. 2:28 PM Patient's blood sugar dropped to 34, even with her eating a sandwich and drinking juice. Patient is still A&Ox4. D10W 500 mL bolus ordered due to the patient only having a 22 ga peripheral IV line. Patient has been stable on exam the entire time she has been in the ED. Our goal is to have 2 hours of blood sugars above 100. For this treatment can be started, the patient started telling the nursing staff that she wanted to leave the ED. It was explained to the patient that she would need to stay until she has blood sugars above 100 for at least 2 hours. The utility of this policy was  explained to her. The patient acknowledged and repeated back this information, voicing understanding. Patient still insisted on leaving. Patient was advised that she would be leaving [place]. Patient states that she understands this as well. Patient is alert and oriented 4 and has a CBG of 70. Patient states that this is adequate for her. Patient was advised that if she changes her mind she is always welcome to come back. Patient agreed to have close follow-up with her PCP as soon as possible as well as agreeing to be observed by family member until she meets with her PCP to avoid dangerous drops in blood sugar. RN Corey Skains was witness to this conversation.  Filed Vitals:   07/11/15 1013 07/11/15 1034 07/11/15 1221 07/11/15 1554  BP: 139/68 145/68 148/68 139/74  Pulse: 84 92 90 93  Temp: 98.4 F (36.9 C)   98.4 F (36.9 C)  TempSrc: Oral   Oral  Resp: 18 18 18 18   SpO2: 100% 100% 100% 99%     Lorayne Bender, PA-C 07/11/15 1607  Ripley Fraise, MD 07/12/15 [phone]

## 2015-07-11 NOTE — ED Provider Notes (Signed)
CSN: [id number]     Arrival date & time 07/11/15  0057 History   First MD Initiated Contact with Patient 07/11/15 0230     Chief Complaint  Patient presents with  . Abdominal Pain     (Consider location/radiation/quality/duration/timing/severity/associated sxs/prior Treatment) HPI Comments: Patient well known to the emergency department for Type 1 DM, gastroparesis, DKA, chronic noncompliance with insulin, presents with complaint of periumbilical abdominal pain, nausea and vomiting. She reports she has been measuring her blood sugar at home and it has been in the 100's since last visit to the emergency department on 07/05/15. No fever. She states the pain, nausea and vomiting are the same as she has experienced with gastroparesis. She is taking Zofran at home without relief.   Patient is a 50 y.o. female presenting with abdominal pain. The history is provided by the patient. No language interpreter was used.  Abdominal Pain Pain location:  Periumbilical Associated symptoms: nausea and vomiting   Associated symptoms: no chest pain, no chills, no diarrhea, no fever and no shortness of breath     Past Medical History  Diagnosis Date  . Diabetes mellitus without complication [facility])    Past Surgical History  Procedure Laterality Date  . Retinal detachment surgery    . Posterior vitrectomy and membrane peel-left eye  09/28/2002  . Posterior vitrectomy and membrane peel-right eye  03/16/2002  . Eye surgery    . Gailstones    . Esophagogastroduodenoscopy  09/27/2014    at [facility], Dr Rolan Lipa. biospy neg for celiac, neg for H pylori.   . Colonoscopy  09/27/2014    at [facility]   Family History  Problem Relation Age of Onset  . Cystic fibrosis Mother   . Hypertension Father   . Diabetes Brother   . Hypertension Maternal Grandmother    Social History  Substance Use Topics  . Smoking status: Never Smoker   . Smokeless tobacco: Never Used  . Alcohol Use: No   OB History    No data  available     Review of Systems  Constitutional: Negative for fever and chills.  Respiratory: Negative.  Negative for shortness of breath.   Cardiovascular: Negative.  Negative for chest pain.  Gastrointestinal: Positive for nausea, vomiting and abdominal pain. Negative for diarrhea.  Genitourinary: Negative.   Musculoskeletal: Negative.  Negative for myalgias.  Skin: Negative.   Neurological: Negative.       Allergies  Anesthetics, amide; Penicillins; Buprenorphine hcl; and Encainide  Home Medications   Prior to Admission medications   Medication Sig Start Date End Date Taking? Authorizing Provider  ALPRAZolam (XANAX) 0.25 MG tablet Take 1 tablet (0.25 mg total) by mouth at bedtime as needed for sleep. Patient taking differently: Take 0.25 mg by mouth 2 (two) times daily as needed for anxiety or sleep.  06/25/15   Allie Bossier, MD  atorvastatin (LIPITOR) 20 MG tablet Take 1 tablet (20 mg total) by mouth daily at 6 PM. 06/25/15   Allie Bossier, MD  dronabinol (MARINOL) 5 MG capsule Take 5 mg by mouth 2 (two) times daily. 05/16/15   Historical Provider, MD  FLUoxetine (PROZAC) 40 MG capsule Take 40 mg by mouth daily.    Historical Provider, MD  furosemide (LASIX) 40 MG tablet Take 40 mg by mouth 2 (two) times daily as needed for fluid. For fluid retention 03/29/15   Historical Provider, MD  insulin aspart (NOVOLOG) 100 UNIT/ML injection Inject 0-15 Units into the skin 3 (three) times daily  with meals. 2-15 units three times daily with food. Per sliding scale 06/25/15   Allie Bossier, MD  insulin detemir (LEVEMIR) 100 UNIT/ML injection Inject 0.15 mLs (15 Units total) into the skin at bedtime. 06/25/15   Allie Bossier, MD  lisinopril (PRINIVIL,ZESTRIL) 5 MG tablet Take 1 tablet (5 mg total) by mouth daily. 06/25/15   Allie Bossier, MD  metoCLOPramide (REGLAN) 10 MG tablet Take 1 tablet (10 mg total) by mouth every 6 (six) hours as needed for nausea. Patient not taking: Reported on  07/05/2015 07/03/15   Robbie Lis, MD  ondansetron (ZOFRAN ODT) 4 MG disintegrating tablet Take 1 tablet (4 mg total) by mouth every 8 (eight) hours as needed for nausea. 07/03/15   Robbie Lis, MD  pantoprazole (PROTONIX) 40 MG tablet Take 40 mg by mouth daily.  01/04/15   Historical Provider, MD  polyethylene glycol powder (GLYCOLAX/MIRALAX) powder Take 17 g by mouth daily as needed. For constipation 04/06/15   Historical Provider, MD  pregabalin (LYRICA) 150 MG capsule Take 1 capsule (150 mg total) by mouth 2 (two) times daily. 02/17/13   Robbie Lis, MD   BP 148/77 mmHg  Pulse 78  Temp(Src) 98.5 F (36.9 C)  Resp 20  SpO2 97%  LMP 06/26/2015 Physical Exam  Constitutional: She is oriented to person, place, and time. She appears well-developed and well-nourished.  HENT:  Head: Normocephalic.  Neck: Normal range of motion. Neck supple.  Cardiovascular: Normal rate and regular rhythm.   Pulmonary/Chest: Effort normal and breath sounds normal. She exhibits no tenderness.  Abdominal: Soft. Bowel sounds are normal. She exhibits no distension and no mass. There is tenderness. There is no rebound and no guarding.  Tenderness limited to periumbilical abdomen. Soft, BS hypoactive.   Musculoskeletal: Normal range of motion.  Neurological: She is alert and oriented to person, place, and time.  Skin: Skin is warm and dry. No rash noted.  Psychiatric: She has a normal mood and affect.    ED Course  Procedures (including critical care time) Labs Review Labs Reviewed  CBC WITH DIFFERENTIAL/PLATELET - Abnormal; Notable for the following:    WBC 2.8 (*)    Hemoglobin 11.3 (*)    MCH 25.7 (*)    RDW 15.9 (*)    Neutro Abs 1.6 (*)    All other components within normal limits  CBG MONITORING, ED - Abnormal; Notable for the following:    Glucose-Capillary 276 (*)    All other components within normal limits  BASIC METABOLIC PANEL   Results for orders placed or performed during the hospital  encounter of 07/11/15  CBC with Differential/Platelet  Result Value Ref Range   WBC 2.8 (L) 4.0 - 10.5 K/uL   RBC 4.39 3.87 - 5.11 MIL/uL   Hemoglobin 11.3 (L) 12.0 - 15.0 g/dL   HCT 36.1 36.0 - 46.0 %   MCV 82.2 78.0 - 100.0 fL   MCH 25.7 (L) 26.0 - 34.0 pg   MCHC 31.3 30.0 - 36.0 g/dL   RDW 15.9 (H) 11.5 - 15.5 %   Platelets 356 150 - 400 K/uL   Neutrophils Relative % 55 %   Neutro Abs 1.6 (L) 1.7 - 7.7 K/uL   Lymphocytes Relative 37 %   Lymphs Abs 1.0 0.7 - 4.0 K/uL   Monocytes Relative 7 %   Monocytes Absolute 0.2 0.1 - 1.0 K/uL   Eosinophils Relative 1 %   Eosinophils Absolute 0.0 0.0 - 0.7 K/uL  Basophils Relative 0 %   Basophils Absolute 0.0 0.0 - 0.1 K/uL  Basic metabolic panel  Result Value Ref Range   Sodium 137 135 - 145 mmol/L   Potassium 4.1 3.5 - 5.1 mmol/L   Chloride 99 (L) 101 - 111 mmol/L   CO2 17 (L) 22 - 32 mmol/L   Glucose, Bld 286 (H) 65 - 99 mg/dL   BUN 10 6 - 20 mg/dL   Creatinine, Ser 0.92 0.44 - 1.00 mg/dL   Calcium 9.8 8.9 - 10.3 mg/dL   GFR calc non Af Amer >60 >60 mL/min   GFR calc Af Amer >60 >60 mL/min   Anion gap 21 (H) 5 - 15  CBG monitoring, ED  Result Value Ref Range   Glucose-Capillary 276 (H) 65 - 99 mg/dL    Imaging Review No results found. I have personally reviewed and evaluated these images and lab results as part of my medical decision-making.   EKG Interpretation None      MDM   Final diagnoses:  None    1. Gastroparesis 2. DKA  The patient presents with symptoms of nausea and vomiting with history of Type 1 DM, DKA, chronic noncompliance with insulin and gastroparesis. VSS, no tachycardia.   Labs in the ED show mild acidosis. CBG less than 300. No vomiting in the ED after 4 hours. Multiple attempts to start IV have failed. EJ being attempted by Arlean Hopping, PA-C. Plan is to attempt to correct acidosis with aggressive hydration and recheck labs. If improved patient can be discharged home. If no improvement, patient  will have to be admitted.     Charlann Lange, PA-C 07/11/15 [id number]  Ripley Fraise, MD 07/11/15 [phone]

## 2015-07-11 NOTE — ED Provider Notes (Signed)
Patient seen/examined in the Emergency Department in conjunction with Midlevel Provider [place] Patient reports abd pain and vomiting Exam : awake/alert, no distress Plan: pt with difficult IV access.  She is dehydrated with mild DKA She is in no distress.  She is taking PO fluids Plan is for her drink water, and then reassess IV status.  She has had multiple failed attempts at IV placement.  Currently pt is nontoxic, no distress and not vomiting  She will need IV fluids and repeat BMP after 2LNS  Ripley Fraise, MD 07/11/15 [phone]

## 2015-07-11 NOTE — ED Notes (Addendum)
Phlebotomy tech notified of blood draw.

## 2015-07-11 NOTE — ED Notes (Signed)
Per Shawn PA, pt to drink about 3 glasses of water then he will insert IV using [place]

## 2015-07-11 NOTE — ED Notes (Signed)
Informed that IV team was unable to obtain an IV.

## 2015-07-12 ENCOUNTER — Telehealth: Payer: Self-pay

## 2015-07-12 NOTE — Telephone Encounter (Signed)
Attempted to contact the patient to check on her status.  Call placed to # [phone] and a HIPAA compliant voice mail message was left requesting a call back to # [phone] or [phone].

## 2015-07-13 ENCOUNTER — Telehealth: Payer: Self-pay

## 2015-07-13 NOTE — Telephone Encounter (Signed)
This Case Manager placed call to patient to check status and to remind her of upcoming appointment on 07/14/15 at 1100 with Dr. Jarold Song. Call placed to [phone]; unable to reach patient. Unable to leave voicemail as voicemail box full.

## 2015-07-14 ENCOUNTER — Inpatient Hospital Stay: Payer: 59 | Admitting: Family Medicine

## 2015-07-18 ENCOUNTER — Encounter (HOSPITAL_COMMUNITY): Payer: Self-pay

## 2015-07-18 ENCOUNTER — Telehealth: Payer: Self-pay

## 2015-07-18 ENCOUNTER — Emergency Department (HOSPITAL_COMMUNITY)
Admission: EM | Admit: 2015-07-18 | Discharge: 2015-07-19 | Disposition: A | Discharge status: Home / Self Care | Payer: 59 | Attending: Emergency Medicine | Admitting: Emergency Medicine

## 2015-07-18 DIAGNOSIS — R350 Frequency of micturition: Secondary | ICD-10-CM | POA: Insufficient documentation

## 2015-07-18 DIAGNOSIS — Z79899 Other long term (current) drug therapy: Secondary | ICD-10-CM | POA: Insufficient documentation

## 2015-07-18 DIAGNOSIS — R112 Nausea with vomiting, unspecified: Secondary | ICD-10-CM

## 2015-07-18 DIAGNOSIS — R1084 Generalized abdominal pain: Secondary | ICD-10-CM | POA: Insufficient documentation

## 2015-07-18 DIAGNOSIS — E119 Type 2 diabetes mellitus without complications: Secondary | ICD-10-CM | POA: Insufficient documentation

## 2015-07-18 DIAGNOSIS — Z880 Allergy status to penicillin: Secondary | ICD-10-CM | POA: Insufficient documentation

## 2015-07-18 DIAGNOSIS — Z8719 Personal history of other diseases of the digestive system: Secondary | ICD-10-CM | POA: Insufficient documentation

## 2015-07-18 DIAGNOSIS — R531 Weakness: Secondary | ICD-10-CM | POA: Insufficient documentation

## 2015-07-18 DIAGNOSIS — Z794 Long term (current) use of insulin: Secondary | ICD-10-CM | POA: Insufficient documentation

## 2015-07-18 DIAGNOSIS — N390 Urinary tract infection, site not specified: Secondary | ICD-10-CM | POA: Insufficient documentation

## 2015-07-18 LAB — CBC WITH DIFFERENTIAL/PLATELET
BASOS ABS: 0.1 10*3/uL (ref 0.0–0.1)
Basophils Relative: 1 %
EOS PCT: 2 %
Eosinophils Absolute: 0.1 10*3/uL (ref 0.0–0.7)
HEMATOCRIT: 35.7 % — AB (ref 36.0–46.0)
Hemoglobin: 11.4 g/dL — ABNORMAL LOW (ref 12.0–15.0)
LYMPHS PCT: 16 %
Lymphs Abs: 1 10*3/uL (ref 0.7–4.0)
MCH: 25.5 pg — AB (ref 26.0–34.0)
MCHC: 31.9 g/dL (ref 30.0–36.0)
MCV: 79.9 fL (ref 78.0–100.0)
MONO ABS: 0.5 10*3/uL (ref 0.1–1.0)
MONOS PCT: 9 %
NEUTROS ABS: 4.3 10*3/uL (ref 1.7–7.7)
Neutrophils Relative %: 72 %
PLATELETS: 444 10*3/uL — AB (ref 150–400)
RBC: 4.47 MIL/uL (ref 3.87–5.11)
RDW: 15.8 % — AB (ref 11.5–15.5)
WBC: 6 10*3/uL (ref 4.0–10.5)

## 2015-07-18 LAB — BASIC METABOLIC PANEL
Anion gap: 11 (ref 5–15)
BUN: 9 mg/dL (ref 6–20)
CALCIUM: 9.7 mg/dL (ref 8.9–10.3)
CO2: 26 mmol/L (ref 22–32)
CREATININE: 0.72 mg/dL (ref 0.44–1.00)
Chloride: 102 mmol/L (ref 101–111)
GFR calc Af Amer: >60 mL/min (ref >60)
GLUCOSE: 246 mg/dL — AB (ref 65–99)
Potassium: 3.6 mmol/L (ref 3.5–5.1)
Sodium: 139 mmol/L (ref 135–145)

## 2015-07-18 NOTE — ED Notes (Signed)
Pt complains of abdominal pain, nausea and vomiting, cbg is 381

## 2015-07-18 NOTE — Telephone Encounter (Signed)
Call placed to the patient to remind her of her appointment tomorrow, 07/19/15 @ 1500.  She said that she has transportation to her appointment but may need a ride home.  Informed her that the clinic can assist her with transportation home if needed. She said that she has all of her medications but is starting to run low on some. Instructed her to bring all of her medications to her appointment tomorrow and the doctor will review them with her and would address the need for refills at that time    She said that she is currently feeling nauseous, no vomiting. She said that she was nauseous yesterday but then started feeling better until this morning.  She noted that she would go to the ED if needed.  She also reported that she has contacted her employer about her short term disability and is waiting for a call back.  Discussed the importance of scheduling an appointment with financial counseling when she comes to the clinic tomorrow. Also reminded her of the  Social worker that is available at the clinic in addition to the pharmacy assistance program. She noted that she was appreciative of the call but she would talk more tomorrow about her needs as she was feeling nauseous at this time.   No other problems/questions reported.

## 2015-07-19 ENCOUNTER — Telehealth: Payer: Self-pay | Admitting: Family Medicine

## 2015-07-19 ENCOUNTER — Encounter: Payer: Self-pay | Admitting: Clinical

## 2015-07-19 ENCOUNTER — Encounter: Payer: Self-pay | Admitting: Family Medicine

## 2015-07-19 ENCOUNTER — Ambulatory Visit: Payer: 59 | Attending: Family Medicine | Admitting: Family Medicine

## 2015-07-19 VITALS — BP 139/81 | HR 98 | Temp 98.1°F | Resp 15 | Ht 62.0 in | Wt 112.0 lb

## 2015-07-19 DIAGNOSIS — F32A Depression, unspecified: Secondary | ICD-10-CM

## 2015-07-19 DIAGNOSIS — E1143 Type 2 diabetes mellitus with diabetic autonomic (poly)neuropathy: Secondary | ICD-10-CM

## 2015-07-19 DIAGNOSIS — Z794 Long term (current) use of insulin: Secondary | ICD-10-CM | POA: Insufficient documentation

## 2015-07-19 DIAGNOSIS — F418 Other specified anxiety disorders: Secondary | ICD-10-CM

## 2015-07-19 DIAGNOSIS — R112 Nausea with vomiting, unspecified: Secondary | ICD-10-CM | POA: Insufficient documentation

## 2015-07-19 DIAGNOSIS — R739 Hyperglycemia, unspecified: Secondary | ICD-10-CM

## 2015-07-19 DIAGNOSIS — N390 Urinary tract infection, site not specified: Secondary | ICD-10-CM | POA: Insufficient documentation

## 2015-07-19 DIAGNOSIS — IMO0002 Reserved for concepts with insufficient information to code with codable children: Secondary | ICD-10-CM

## 2015-07-19 DIAGNOSIS — F419 Anxiety disorder, unspecified: Secondary | ICD-10-CM | POA: Insufficient documentation

## 2015-07-19 DIAGNOSIS — E108 Type 1 diabetes mellitus with unspecified complications: Secondary | ICD-10-CM

## 2015-07-19 DIAGNOSIS — E1040 Type 1 diabetes mellitus with diabetic neuropathy, unspecified: Secondary | ICD-10-CM

## 2015-07-19 DIAGNOSIS — K3184 Gastroparesis: Secondary | ICD-10-CM

## 2015-07-19 DIAGNOSIS — E1043 Type 1 diabetes mellitus with diabetic autonomic (poly)neuropathy: Secondary | ICD-10-CM | POA: Insufficient documentation

## 2015-07-19 DIAGNOSIS — E1065 Type 1 diabetes mellitus with hyperglycemia: Secondary | ICD-10-CM

## 2015-07-19 DIAGNOSIS — R109 Unspecified abdominal pain: Secondary | ICD-10-CM | POA: Insufficient documentation

## 2015-07-19 DIAGNOSIS — F329 Major depressive disorder, single episode, unspecified: Secondary | ICD-10-CM | POA: Insufficient documentation

## 2015-07-19 LAB — URINE MICROSCOPIC-ADD ON: RBC / HPF: NONE SEEN RBC/hpf (ref 0–5)

## 2015-07-19 LAB — GLUCOSE, POCT (MANUAL RESULT ENTRY)
POC Glucose: 326 mg/dl — AB (ref 70–99)
POC Glucose: 310 mg/dl — AB (ref 70–99)

## 2015-07-19 LAB — URINALYSIS, ROUTINE W REFLEX MICROSCOPIC
BILIRUBIN URINE: NEGATIVE
Glucose, UA: >1000 mg/dL — AB
Hgb urine dipstick: NEGATIVE
KETONES UR: 40 mg/dL — AB
Leukocytes, UA: NEGATIVE
NITRITE: NEGATIVE
Protein, ur: 30 mg/dL — AB
Specific Gravity, Urine: 1.023 (ref 1.005–1.030)
pH: 7 (ref 5.0–8.0)

## 2015-07-19 LAB — CBG MONITORING, ED: GLUCOSE-CAPILLARY: 127 mg/dL — AB (ref 65–99)

## 2015-07-19 LAB — HEPATIC FUNCTION PANEL
ALBUMIN: 3.8 g/dL (ref 3.5–5.0)
ALT: 22 U/L (ref 14–54)
AST: 31 U/L (ref 15–41)
Alkaline Phosphatase: 96 U/L (ref 38–126)
Bilirubin, Direct: 0.2 mg/dL (ref 0.1–0.5)
Indirect Bilirubin: 0.7 mg/dL (ref 0.3–0.9)
Total Bilirubin: 0.9 mg/dL (ref 0.3–1.2)
Total Protein: 7.9 g/dL (ref 6.5–8.1)

## 2015-07-19 LAB — POCT GLYCOSYLATED HEMOGLOBIN (HGB A1C): Hemoglobin A1C: 8

## 2015-07-19 LAB — LIPASE, BLOOD: Lipase: 35 U/L (ref 11–51)

## 2015-07-19 MED ORDER — BUSPIRONE HCL 5 MG PO TABS: 5.0000 mg | ORAL_TABLET | Freq: Three times a day (TID) | ORAL | Status: DC

## 2015-07-19 MED ORDER — INSULIN ASPART 100 UNIT/ML ~~LOC~~ SOLN
6.0000 [IU] | Freq: Once | SUBCUTANEOUS | Status: AC
  Administered 2015-07-19: 6 [IU] via SUBCUTANEOUS

## 2015-07-19 MED ORDER — LISINOPRIL 5 MG PO TABS: 5.0000 mg | ORAL_TABLET | Freq: Every day | ORAL | Status: DC

## 2015-07-19 MED ORDER — ONDANSETRON 4 MG PO TBDP: 4.0000 mg | ORAL_TABLET | Freq: Three times a day (TID) | ORAL | Status: DC | PRN

## 2015-07-19 MED ORDER — PROMETHAZINE HCL 25 MG/ML IJ SOLN
25.0000 mg | Freq: Once | INTRAMUSCULAR | Status: AC
  Administered 2015-07-19: 25 mg via INTRAMUSCULAR
  Filled 2015-07-19: qty 1

## 2015-07-19 MED ORDER — SODIUM CHLORIDE 0.9 % IV BOLUS (SEPSIS)
1000.0000 mL | Freq: Once | INTRAVENOUS | Status: AC
  Administered 2015-07-19: 1000 mL via INTRAVENOUS

## 2015-07-19 MED ORDER — TRUEPLUS LANCETS 28G MISC: 1.0000 | Freq: Three times a day (TID) | Status: DC

## 2015-07-19 MED ORDER — PREGABALIN 150 MG PO CAPS: 150.0000 mg | ORAL_CAPSULE | Freq: Two times a day (BID) | ORAL | Status: DC

## 2015-07-19 MED ORDER — TRUE METRIX METER DEVI: 1.0000 | Freq: Three times a day (TID) | Status: DC

## 2015-07-19 MED ORDER — ALPRAZOLAM 0.25 MG PO TABS
0.2500 mg | ORAL_TABLET | Freq: Once | ORAL | Status: AC
  Administered 2015-07-19: 0.25 mg via ORAL
  Filled 2015-07-19: qty 1

## 2015-07-19 MED ORDER — LEVOFLOXACIN 750 MG PO TABS: 750.0000 mg | ORAL_TABLET | Freq: Every day | ORAL | Status: DC

## 2015-07-19 MED ORDER — GLUCOSE BLOOD VI STRP: ORAL_STRIP | Status: DC

## 2015-07-19 MED FILL — ONDANSETRON ODT 4 MG TABLET: 4 | 13 days supply | Qty: 40 | Fill #0

## 2015-07-19 MED FILL — LISINOPRIL 5 MG TABLET: 5 | 30 days supply | Qty: 30 | Fill #0

## 2015-07-19 MED FILL — ?BUSPIRONE HCL 5 MG TABLET: 5 MG | 30 days supply | Qty: 90 | Fill #0

## 2015-07-19 MED FILL — TRUE METRIX TEST STRIP: 33 days supply | Qty: 100 | Fill #0

## 2015-07-19 MED FILL — TRUEplus LANCETS 28G MISC: 33 days supply | Qty: 100 | Fill #0

## 2015-07-19 MED FILL — !TRUE METRIX BLOOD GLUCOSE: 365 days supply | Qty: 1 | Fill #0

## 2015-07-19 NOTE — ED Notes (Signed)
IV Team attempting IV insertion at present

## 2015-07-19 NOTE — Patient Instructions (Signed)
Diabetes Mellitus and Food It is important for you to manage your blood sugar (glucose) level. Your blood glucose level can be greatly affected by what you eat. Eating healthier foods in the appropriate amounts throughout the day at about the same time each day will help you control your blood glucose level. It can also help slow or prevent worsening of your diabetes mellitus. Healthy eating may even help you improve the level of your blood pressure and reach or maintain a healthy weight.  General recommendations for healthful eating and cooking habits include:  Eating meals and snacks regularly. Avoid going long periods of time without eating to lose weight.  Eating a diet that consists mainly of plant-based foods, such as fruits, vegetables, nuts, legumes, and whole grains.  Using low-heat cooking methods, such as baking, instead of high-heat cooking methods, such as deep frying. Work with your dietitian to make sure you understand how to use the Nutrition Facts information on food labels. HOW CAN FOOD AFFECT ME? Carbohydrates Carbohydrates affect your blood glucose level more than any other type of food. Your dietitian will help you determine how many carbohydrates to eat at each meal and teach you how to count carbohydrates. Counting carbohydrates is important to keep your blood glucose at a healthy level, especially if you are using insulin or taking certain medicines for diabetes mellitus. Alcohol Alcohol can cause sudden decreases in blood glucose (hypoglycemia), especially if you use insulin or take certain medicines for diabetes mellitus. Hypoglycemia can be a life-threatening condition. Symptoms of hypoglycemia (sleepiness, dizziness, and disorientation) are similar to symptoms of having too much alcohol.  If your health care provider has given you approval to drink alcohol, do so in moderation and use the following guidelines:  Women should not have more than one drink per day, and men  should not have more than two drinks per day. One drink is equal to:  12 oz of beer.  5 oz of wine.  1 oz of hard liquor.  Do not drink on an empty stomach.  Keep yourself hydrated. Have water, diet soda, or unsweetened iced tea.  Regular soda, juice, and other mixers might contain a lot of carbohydrates and should be counted. WHAT FOODS ARE NOT RECOMMENDED? As you make food choices, it is important to remember that all foods are not the same. Some foods have fewer nutrients per serving than other foods, even though they might have the same number of calories or carbohydrates. It is difficult to get your body what it needs when you eat foods with fewer nutrients. Examples of foods that you should avoid that are high in calories and carbohydrates but low in nutrients include:  Trans fats (most processed foods list trans fats on the Nutrition Facts label).  Regular soda.  Juice.  Candy.  Sweets, such as cake, pie, doughnuts, and cookies.  Fried foods. WHAT FOODS CAN I EAT? Eat nutrient-rich foods, which will nourish your body and keep you healthy. The food you should eat also will depend on several factors, including:  The calories you need.  The medicines you take.  Your weight.  Your blood glucose level.  Your blood pressure level.  Your cholesterol level. You should eat a variety of foods, including:  Protein.  Lean cuts of meat.  Proteins low in saturated fats, such as fish, egg whites, and beans. Avoid processed meats.  Fruits and vegetables.  Fruits and vegetables that may help control blood glucose levels, such as apples, mangoes, and   yams.  Dairy products.  Choose fat-free or low-fat dairy products, such as milk, yogurt, and cheese.  Grains, bread, pasta, and rice.  Choose whole grain products, such as multigrain bread, whole oats, and brown rice. These foods may help control blood pressure.  Fats.  Foods containing healthful fats, such as nuts,  avocado, olive oil, canola oil, and fish. DOES EVERYONE WITH DIABETES MELLITUS HAVE THE SAME MEAL PLAN? Because every person with diabetes mellitus is different, there is not one meal plan that works for everyone. It is very important that you meet with a dietitian who will help you create a meal plan that is just right for you.   This information is not intended to replace advice given to you by your health care provider. Make sure you discuss any questions you have with your health care provider.   Document Released: 03/08/2005 Document Revised: 07/02/2014 Document Reviewed: 05/08/2013 Elsevier Interactive Patient Education 2016 Elsevier Inc.  

## 2015-07-19 NOTE — Progress Notes (Signed)
Subjective:  Patient ID: [name], female    DOB: Apr 10, 1966  Age: 50 y.o. MRN: [id number]  CC: Establish Care   HPI [name] is a 50 year old female with a history of type 1 diabetes mellitus (A1c 7.3 from 04/2015), diabetic neuropathy, diabetic gastroparesis,depression and anxiety who comes into the clinic to establish care. She was seen at the ED this morning with abdominal pain, nausea and vomiting for which she received IM Phenergan.she was also placed on Levaquin for UTI.  She informs me at this time that she is yet to pick up her prescription for Levaquin and has not been able to refill her other medications due to financial constraints. PCP was Dr. Dory Horn whom she last saw in 03/2015 and she was also seeing a gastroenterologist for gastroparesis onto that she lost her insurance. She has had frequent ED visits for gastroparesis necessitating insertion of IV lines and blood draws and tells me today she was advised to get a port which would facilitate easy access.  Outpatient Prescriptions Prior to Visit  Medication Sig Dispense Refill  . dronabinol (MARINOL) 5 MG capsule Take 5 mg by mouth 2 (two) times daily.    Marland Kitchen FLUoxetine (PROZAC) 40 MG capsule Take 40 mg by mouth daily.    . furosemide (LASIX) 40 MG tablet Take 40 mg by mouth 2 (two) times daily as needed for fluid. For fluid retention    . insulin aspart (NOVOLOG) 100 UNIT/ML injection Inject 0-15 Units into the skin 3 (three) times daily with meals. 2-15 units three times daily with food. Per sliding scale 1 vial 12  . insulin detemir (LEVEMIR) 100 UNIT/ML injection Inject 0.15 mLs (15 Units total) into the skin at bedtime. 10 mL 3  . levofloxacin (LEVAQUIN) 750 MG tablet Take 1 tablet (750 mg total) by mouth daily. 5 tablet 0  . ondansetron (ZOFRAN ODT) 4 MG disintegrating tablet Take 1 tablet (4 mg total) by mouth every 8 (eight) hours as needed for nausea. 40 tablet 0  . atorvastatin (LIPITOR) 20 MG tablet Take 1  tablet (20 mg total) by mouth daily at 6 PM. (Patient not taking: Reported on 07/19/2015) 30 tablet 0  . metoCLOPramide (REGLAN) 10 MG tablet Take 1 tablet (10 mg total) by mouth every 6 (six) hours as needed for nausea. (Patient not taking: Reported on 07/05/2015) 20 tablet 0  . pantoprazole (PROTONIX) 40 MG tablet Take 40 mg by mouth daily. Reported on 07/19/2015    . polyethylene glycol powder (GLYCOLAX/MIRALAX) powder Take 17 g by mouth daily as needed. Reported on 07/19/2015    . ALPRAZolam (XANAX) 0.25 MG tablet Take 1 tablet (0.25 mg total) by mouth at bedtime as needed for sleep. (Patient not taking: Reported on 07/19/2015) 15 tablet 0  . lisinopril (PRINIVIL,ZESTRIL) 5 MG tablet Take 1 tablet (5 mg total) by mouth daily. (Patient not taking: Reported on 07/19/2015) 30 tablet 0  . pregabalin (LYRICA) 150 MG capsule Take 1 capsule (150 mg total) by mouth 2 (two) times daily. (Patient not taking: Reported on 07/19/2015) 60 capsule 0   No facility-administered medications prior to visit.    ROS Review of Systems  Constitutional: Negative for activity change, appetite change and fatigue.  HENT: Negative for congestion, sinus pressure and sore throat.   Eyes: Negative for visual disturbance.  Respiratory: Negative for cough, chest tightness, shortness of breath and wheezing.   Cardiovascular: Negative for chest pain and palpitations.  Gastrointestinal: Negative for abdominal pain, constipation and abdominal distention.  Endocrine: Negative for polydipsia.  Genitourinary: Negative for dysuria and frequency.  Musculoskeletal: Negative for back pain and arthralgias.  Skin: Negative for rash.  Neurological: Negative for tremors, light-headedness and numbness.  Hematological: Does not bruise/bleed easily.  Psychiatric/Behavioral: Negative for behavioral problems and agitation.    Objective:  BP 139/81 mmHg  Pulse 98  Temp(Src) 98.1 F (36.7 C)  Resp 15  Ht 5\' 2"  (1.575 m)  Wt 112 lb (50.803  kg)  BMI 20.48 kg/m2  SpO2 100%  LMP 06/26/2015  BP/Weight 07/19/2015 07/19/2015 [id number]  Systolic BP XX123456 0000000 [id number]  Diastolic BP 81 84 74  Wt. (Lbs) 112 - -  BMI 20.48 - -      Physical Exam  Constitutional: She is oriented to person, place, and time. She appears well-developed and well-nourished.  Thin  Cardiovascular: Normal rate, normal heart sounds and intact distal pulses.   No murmur heard. Pulmonary/Chest: Effort normal and breath sounds normal. She has no wheezes. She has no rales. She exhibits no tenderness.  Abdominal: Soft. Bowel sounds are normal. She exhibits no distension and no mass. There is no tenderness.  Musculoskeletal: Normal range of motion.  Neurological: She is alert and oriented to person, place, and time.     Assessment & Plan:   1. Type I diabetes mellitus with complication, uncontrolled (HCC) A1c of 7.3 from 04/2015 - Glucose (CBG) - HgB A1c - lisinopril (PRINIVIL,ZESTRIL) 5 MG tablet; Take 1 tablet (5 mg total) by mouth daily.  Dispense: 30 tablet; Refill: 2 - glucose blood (TRUE METRIX BLOOD GLUCOSE TEST) test strip; Use 3 times daily before meals  Dispense: 100 each; Refill: 5 - Blood Glucose Monitoring Suppl (TRUE METRIX METER) DEVI; 1 each by Does not apply route 3 (three) times daily before meals.  Dispense: 1 Device; Refill: 0 - TRUEPLUS LANCETS 28G MISC; 1 each by Does not apply route 3 (three) times daily before meals.  Dispense: 100 each; Refill: 5 - insulin aspart (novoLOG) injection 6 Units; Inject 0.06 mLs (6 Units total) into the skin once.  2. Anxiety and depression Currently on Prozac. I have discontinued Xanax which I have informed her is habit forming and I would not be prescribing. LCSW called in to see the patient - busPIRone (BUSPAR) 5 MG tablet; Take 1 tablet (5 mg total) by mouth 3 (three) times daily.  Dispense: 90 tablet; Refill: 1  3. Diabetic neuropathy, type I diabetes mellitus ([place]) Remains on gabapentin -  pregabalin (LYRICA) 150 MG capsule; Take 1 capsule (150 mg total) by mouth 2 (two) times daily.  Dispense: 60 capsule; Refill: 0  4. Diabetic gastroparesis ([place]) Patient reports slurred speech which Reglan a year ago but states during her most recent hospitalization she received it and did well. Due to reported history of questionable allergy I will not place her on it I have also informed her that I will be unable to refill Marinol. - ondansetron (ZOFRAN ODT) 4 MG disintegrating tablet; Take 1 tablet (4 mg total) by mouth every 8 (eight) hours as needed for nausea.  Dispense: 40 tablet; Refill: 0  5. Hyperglycemia Receive 6 units of NovoLog due to hyperglycemia and blood sugar rechecked prior to leaving - Glucose (CBG)   Advised to take Lasix every other day as needed which she was previously taking twice daily for pedal edema as cardiac history is negative for CHF and her BMI is on the low side. Advised of resources in the pharmacy and the fact that the pharmacy  on site will be willing to work with her despite financial constraints. She has also been encouraged to apply for the [facility] discount and [place] card. We will proceed with placing an order for a port at her next visit.  Meds ordered this encounter  Medications  . lisinopril (PRINIVIL,ZESTRIL) 5 MG tablet    Sig: Take 1 tablet (5 mg total) by mouth daily.    Dispense:  30 tablet    Refill:  2  . pregabalin (LYRICA) 150 MG capsule    Sig: Take 1 capsule (150 mg total) by mouth 2 (two) times daily.    Dispense:  60 capsule    Refill:  0  . ondansetron (ZOFRAN ODT) 4 MG disintegrating tablet    Sig: Take 1 tablet (4 mg total) by mouth every 8 (eight) hours as needed for nausea.    Dispense:  40 tablet    Refill:  0  . glucose blood (TRUE METRIX BLOOD GLUCOSE TEST) test strip    Sig: Use 3 times daily before meals    Dispense:  100 each    Refill:  5  . Blood Glucose Monitoring Suppl (TRUE METRIX METER) DEVI    Sig: 1  each by Does not apply route 3 (three) times daily before meals.    Dispense:  1 Device    Refill:  0  . TRUEPLUS LANCETS 28G MISC    Sig: 1 each by Does not apply route 3 (three) times daily before meals.    Dispense:  100 each    Refill:  5  . busPIRone (BUSPAR) 5 MG tablet    Sig: Take 1 tablet (5 mg total) by mouth 3 (three) times daily.    Dispense:  90 tablet    Refill:  1  . insulin aspart (novoLOG) injection 6 Units    Sig:     Follow-up: Return in about 2 weeks (around 08/02/2015) for follow-up of diabetes mellitus.[name] MD

## 2015-07-19 NOTE — ED Provider Notes (Signed)
CSN: [id number]     Arrival date & time 07/18/15  2102 History   First MD Initiated Contact with Patient 07/19/15 0049     Chief Complaint  Patient presents with  . Abdominal Pain     (Consider location/radiation/quality/duration/timing/severity/associated sxs/prior Treatment) HPI Comments: Patient well known to the emergency department with history of DM, chronic noncompliance, DKA, gastroparesis presents with abdominal pain, nausea and vomiting similar to her symptoms with gastroparesis. No fever. No hematemesis. Symptoms started yesterday. She has been using both PR and PO phenergan at home with control of symptoms.   Patient is a 50 y.o. female presenting with abdominal pain. The history is provided by the patient. No language interpreter was used.  Abdominal Pain Pain location:  Generalized Pain quality: aching and cramping   Associated symptoms: nausea and vomiting   Associated symptoms: no chest pain, no chills, no diarrhea, no dysuria, no fever and no shortness of breath     Past Medical History  Diagnosis Date  . Diabetes mellitus without complication [facility])    Past Surgical History  Procedure Laterality Date  . Retinal detachment surgery    . Posterior vitrectomy and membrane peel-left eye  09/28/2002  . Posterior vitrectomy and membrane peel-right eye  03/16/2002  . Eye surgery    . Gailstones    . Esophagogastroduodenoscopy  09/27/2014    at [facility], Dr Rolan Lipa. biospy neg for celiac, neg for H pylori.   . Colonoscopy  09/27/2014    at [facility]   Family History  Problem Relation Age of Onset  . Cystic fibrosis Mother   . Hypertension Father   . Diabetes Brother   . Hypertension Maternal Grandmother    Social History  Substance Use Topics  . Smoking status: Never Smoker   . Smokeless tobacco: Never Used  . Alcohol Use: No   OB History    No data available     Review of Systems  Constitutional: Negative for fever and chills.  Respiratory: Negative.   Negative for shortness of breath.   Cardiovascular: Negative.  Negative for chest pain.  Gastrointestinal: Positive for nausea, vomiting and abdominal pain. Negative for diarrhea and blood in stool.  Genitourinary: Positive for frequency. Negative for dysuria.  Musculoskeletal: Negative.  Negative for myalgias.  Skin: Negative.  Negative for color change and pallor.  Neurological: Positive for weakness. Negative for syncope.      Allergies  Anesthetics, amide; Penicillins; Buprenorphine hcl; and Encainide  Home Medications   Prior to Admission medications   Medication Sig Start Date End Date Taking? Authorizing Provider  ALPRAZolam (XANAX) 0.25 MG tablet Take 1 tablet (0.25 mg total) by mouth at bedtime as needed for sleep. Patient taking differently: Take 0.25 mg by mouth 2 (two) times daily as needed for anxiety or sleep.  06/25/15  Yes Allie Bossier, MD  atorvastatin (LIPITOR) 20 MG tablet Take 1 tablet (20 mg total) by mouth daily at 6 PM. 06/25/15  Yes Allie Bossier, MD  dronabinol (MARINOL) 5 MG capsule Take 5 mg by mouth 2 (two) times daily. 05/16/15  Yes Historical Provider, MD  FLUoxetine (PROZAC) 40 MG capsule Take 40 mg by mouth daily.   Yes Historical Provider, MD  furosemide (LASIX) 40 MG tablet Take 40 mg by mouth 2 (two) times daily as needed for fluid. For fluid retention 03/29/15  Yes Historical Provider, MD  insulin aspart (NOVOLOG) 100 UNIT/ML injection Inject 0-15 Units into the skin 3 (three) times daily with meals. 2-15  units three times daily with food. Per sliding scale 06/25/15  Yes Allie Bossier, MD  insulin detemir (LEVEMIR) 100 UNIT/ML injection Inject 0.15 mLs (15 Units total) into the skin at bedtime. 06/25/15  Yes Allie Bossier, MD  lisinopril (PRINIVIL,ZESTRIL) 5 MG tablet Take 1 tablet (5 mg total) by mouth daily. 06/25/15  Yes Allie Bossier, MD  ondansetron (ZOFRAN ODT) 4 MG disintegrating tablet Take 1 tablet (4 mg total) by mouth every 8 (eight)  hours as needed for nausea. 07/03/15  Yes Robbie Lis, MD  pantoprazole (PROTONIX) 40 MG tablet Take 40 mg by mouth daily.  01/04/15  Yes Historical Provider, MD  polyethylene glycol powder (GLYCOLAX/MIRALAX) powder Take 17 g by mouth daily as needed. For constipation 04/06/15  Yes Historical Provider, MD  pregabalin (LYRICA) 150 MG capsule Take 1 capsule (150 mg total) by mouth 2 (two) times daily. 02/17/13  Yes Robbie Lis, MD  metoCLOPramide (REGLAN) 10 MG tablet Take 1 tablet (10 mg total) by mouth every 6 (six) hours as needed for nausea. Patient not taking: Reported on 07/05/2015 07/03/15   Robbie Lis, MD   BP 146/71 mmHg  Pulse 69  Temp(Src) 98.5 F (36.9 C) (Oral)  Resp 18  SpO2 100%  LMP 06/26/2015 Physical Exam  Constitutional: She is oriented to person, place, and time. She appears well-developed and well-nourished.  HENT:  Head: Normocephalic.  Mouth/Throat: Mucous membranes are dry.  Eyes: Conjunctivae are normal.  Neck: Normal range of motion. Neck supple.  Cardiovascular: Normal rate and regular rhythm.   Pulmonary/Chest: Effort normal and breath sounds normal.  Abdominal: Soft. Bowel sounds are normal. There is tenderness. There is no rebound and no guarding.  Diffusely tender, soft abdomen.  Musculoskeletal: Normal range of motion.  Neurological: She is alert and oriented to person, place, and time. No cranial nerve deficit.  Skin: Skin is warm and dry. No rash noted.  Psychiatric: She has a normal mood and affect.    ED Course  Procedures (including critical care time) Labs Review Labs Reviewed  CBC WITH DIFFERENTIAL/PLATELET - Abnormal; Notable for the following:    Hemoglobin 11.4 (*)    HCT 35.7 (*)    MCH 25.5 (*)    RDW 15.8 (*)    Platelets 444 (*)    All other components within normal limits  BASIC METABOLIC PANEL - Abnormal; Notable for the following:    Glucose, Bld 246 (*)    All other components within normal limits  CBG MONITORING, ED -  Abnormal; Notable for the following:    Glucose-Capillary 127 (*)    All other components within normal limits  URINALYSIS, ROUTINE W REFLEX MICROSCOPIC (NOT AT [facility])  HEPATIC FUNCTION PANEL  LIPASE, BLOOD   Results for orders placed or performed during the hospital encounter of 07/18/15  CBC WITH DIFFERENTIAL  Result Value Ref Range   WBC 6.0 4.0 - 10.5 K/uL   RBC 4.47 3.87 - 5.11 MIL/uL   Hemoglobin 11.4 (L) 12.0 - 15.0 g/dL   HCT 35.7 (L) 36.0 - 46.0 %   MCV 79.9 78.0 - 100.0 fL   MCH 25.5 (L) 26.0 - 34.0 pg   MCHC 31.9 30.0 - 36.0 g/dL   RDW 15.8 (H) 11.5 - 15.5 %   Platelets 444 (H) 150 - 400 K/uL   Neutrophils Relative % 72 %   Neutro Abs 4.3 1.7 - 7.7 K/uL   Lymphocytes Relative 16 %   Lymphs Abs 1.0 0.7 -  4.0 K/uL   Monocytes Relative 9 %   Monocytes Absolute 0.5 0.1 - 1.0 K/uL   Eosinophils Relative 2 %   Eosinophils Absolute 0.1 0.0 - 0.7 K/uL   Basophils Relative 1 %   Basophils Absolute 0.1 0.0 - 0.1 K/uL  Basic metabolic panel  Result Value Ref Range   Sodium 139 135 - 145 mmol/L   Potassium 3.6 3.5 - 5.1 mmol/L   Chloride 102 101 - 111 mmol/L   CO2 26 22 - 32 mmol/L   Glucose, Bld 246 (H) 65 - 99 mg/dL   BUN 9 6 - 20 mg/dL   Creatinine, Ser 0.72 0.44 - 1.00 mg/dL   Calcium 9.7 8.9 - 10.3 mg/dL   GFR calc non Af Amer >60 >60 mL/min   GFR calc Af Amer >60 >60 mL/min   Anion gap 11 5 - 15  Urinalysis, Routine w reflex microscopic (not at [facility])  Result Value Ref Range   Color, Urine YELLOW YELLOW   APPearance CLOUDY (A) CLEAR   Specific Gravity, Urine 1.023 1.005 - 1.030   pH 7.0 5.0 - 8.0   Glucose, UA >1000 (A) NEGATIVE mg/dL   Hgb urine dipstick NEGATIVE NEGATIVE   Bilirubin Urine NEGATIVE NEGATIVE   Ketones, ur 40 (A) NEGATIVE mg/dL   Protein, ur 30 (A) NEGATIVE mg/dL   Nitrite NEGATIVE NEGATIVE   Leukocytes, UA NEGATIVE NEGATIVE  Hepatic function panel  Result Value Ref Range   Total Protein 7.9 6.5 - 8.1 g/dL   Albumin 3.8 3.5 - 5.0 g/dL    AST 31 15 - 41 U/L   ALT 22 14 - 54 U/L   Alkaline Phosphatase 96 38 - 126 U/L   Total Bilirubin 0.9 0.3 - 1.2 mg/dL   Bilirubin, Direct 0.2 0.1 - 0.5 mg/dL   Indirect Bilirubin 0.7 0.3 - 0.9 mg/dL  Lipase, blood  Result Value Ref Range   Lipase 35 11 - 51 U/L  Urine microscopic-add on  Result Value Ref Range   Squamous Epithelial / LPF 6-30 (A) NONE SEEN   WBC, UA 6-30 0 - 5 WBC/hpf   RBC / HPF NONE SEEN 0 - 5 RBC/hpf   Bacteria, UA MANY (A) NONE SEEN   Urine-Other MUCOUS PRESENT   POC CBG, ED  Result Value Ref Range   Glucose-Capillary 127 (H) 65 - 99 mg/dL   Comment 1 Notify RN    Comment 2 Document in Chart     Imaging Review No results found. I have personally reviewed and evaluated these images and lab results as part of my medical decision-making.   EKG Interpretation None      MDM   Final diagnoses:  None    1. Nausea and vomiting 2. UTI  2:30:  No further vomiting at this point despite delay in receiving Phenergan IM. IV team in the room to attempt start of IV.  Labs were able to be collected. There is no acidosis, leukocytosis, fever. No vomiting after IM phenergan. She is felt stable for discharge home. Will treat for UTI with Levaquin, culture pending.  Charlann Lange, PA-C 07/19/15 [place], MD 07/19/15 0630

## 2015-07-19 NOTE — ED Notes (Signed)
Pt taken to bathroom via wheelchair

## 2015-07-19 NOTE — Telephone Encounter (Signed)
Pt is here and expresses concern because the pharmacy is unable to fill her medicine for 4-6 weeks. Patient would like to know if there are any other suggestions for her to try, as she states she is in a lot of pain. Please advise at the earliest convenience. Thank you, Fonda Kinder, ASA

## 2015-07-19 NOTE — Discharge Instructions (Signed)
Nausea and Vomiting °Nausea is a sick feeling that often comes before throwing up (vomiting). Vomiting is a reflex where stomach contents come out of your mouth. Vomiting can cause severe loss of body fluids (dehydration). Children and elderly adults can become dehydrated quickly, especially if they also have diarrhea. Nausea and vomiting are symptoms of a condition or disease. It is important to find the cause of your symptoms. °CAUSES  °· Direct irritation of the stomach lining. This irritation can result from increased acid production (gastroesophageal reflux disease), infection, food poisoning, taking certain medicines (such as nonsteroidal anti-inflammatory drugs), alcohol use, or tobacco use. °· Signals from the brain. These signals could be caused by a headache, heat exposure, an inner ear disturbance, increased pressure in the brain from injury, infection, a tumor, or a concussion, pain, emotional stimulus, or metabolic problems. °· An obstruction in the gastrointestinal tract (bowel obstruction). °· Illnesses such as diabetes, hepatitis, gallbladder problems, appendicitis, kidney problems, cancer, sepsis, atypical symptoms of a heart attack, or eating disorders. °· Medical treatments such as chemotherapy and radiation. °· Receiving medicine that makes you sleep (general anesthetic) during surgery. °DIAGNOSIS °Your caregiver may ask for tests to be done if the problems do not improve after a few days. Tests may also be done if symptoms are severe or if the reason for the nausea and vomiting is not clear. Tests may include: °· Urine tests. °· Blood tests. °· Stool tests. °· Cultures (to look for evidence of infection). °· X-rays or other imaging studies. °Test results can help your caregiver make decisions about treatment or the need for additional tests. °TREATMENT °You need to stay well hydrated. Drink frequently but in small amounts. You may wish to drink water, sports drinks, clear broth, or eat frozen  ice pops or gelatin dessert to help stay hydrated. When you eat, eating slowly may help prevent nausea. There are also some antinausea medicines that may help prevent nausea. °HOME CARE INSTRUCTIONS  °· Take all medicine as directed by your caregiver. °· If you do not have an appetite, do not force yourself to eat. However, you must continue to drink fluids. °· If you have an appetite, eat a normal diet unless your caregiver tells you differently. °· Eat a variety of complex carbohydrates (rice, wheat, potatoes, bread), lean meats, yogurt, fruits, and vegetables. °· Avoid high-fat foods because they are more difficult to digest. °· Drink enough water and fluids to keep your urine clear or pale yellow. °· If you are dehydrated, ask your caregiver for specific rehydration instructions. Signs of dehydration may include: °· Severe thirst. °· Dry lips and mouth. °· Dizziness. °· Dark urine. °· Decreasing urine frequency and amount. °· Confusion. °· Rapid breathing or pulse. °SEEK IMMEDIATE MEDICAL CARE IF:  °· You have blood or brown flecks (like coffee grounds) in your vomit. °· You have black or bloody stools. °· You have a severe headache or stiff neck. °· You are confused. °· You have severe abdominal pain. °· You have chest pain or trouble breathing. °· You do not urinate at least once every 8 hours. °· You develop cold or clammy skin. °· You continue to vomit for longer than 24 to 48 hours. °· You have a fever. °MAKE SURE YOU:  °· Understand these instructions. °· Will watch your condition. °· Will get help right away if you are not doing well or get worse. °  °This information is not intended to replace advice given to you by your health care provider. Make sure   you discuss any questions you have with your health care provider. °  °Document Released: 06/11/2005 Document Revised: 09/03/2011 Document Reviewed: 11/08/2010 °Elsevier Interactive Patient Education ©2016 Elsevier Inc[employer] ° °Urinary Tract Infection °Urinary  tract infections (UTIs) can develop anywhere along your urinary tract. Your urinary tract is your body's drainage system for removing wastes and extra water. Your urinary tract includes two kidneys, two ureters, a bladder, and a urethra. Your kidneys are a pair of bean-shaped organs. Each kidney is about the size of your fist. They are located below your ribs, one on each side of your spine. °CAUSES °Infections are caused by microbes, which are microscopic organisms, including fungi, viruses, and bacteria. These organisms are so small that they can only be seen through a microscope. Bacteria are the microbes that most commonly cause UTIs. °SYMPTOMS  °Symptoms of UTIs may vary by age and gender of the patient and by the location of the infection. Symptoms in young women typically include a frequent and intense urge to urinate and a painful, burning feeling in the bladder or urethra during urination. Older women and men are more likely to be tired, shaky, and weak and have muscle aches and abdominal pain. A fever may mean the infection is in your kidneys. Other symptoms of a kidney infection include pain in your back or sides below the ribs, nausea, and vomiting. °DIAGNOSIS °To diagnose a UTI, your caregiver will ask you about your symptoms. Your caregiver will also ask you to provide a urine sample. The urine sample will be tested for bacteria and white blood cells. White blood cells are made by your body to help fight infection. °TREATMENT  °Typically, UTIs can be treated with medication. Because most UTIs are caused by a bacterial infection, they usually can be treated with the use of antibiotics. The choice of antibiotic and length of treatment depend on your symptoms and the type of bacteria causing your infection. °HOME CARE INSTRUCTIONS °· If you were prescribed antibiotics, take them exactly as your caregiver instructs you. Finish the medication even if you feel better after you have only taken some of the  medication. °· Drink enough water and fluids to keep your urine clear or pale yellow. °· Avoid caffeine, tea, and carbonated beverages. They tend to irritate your bladder. °· Empty your bladder often. Avoid holding urine for long periods of time. °· Empty your bladder before and after sexual intercourse. °· After a bowel movement, women should cleanse from front to back. Use each tissue only once. °SEEK MEDICAL CARE IF:  °· You have back pain. °· You develop a fever. °· Your symptoms do not begin to resolve within 3 days. °SEEK IMMEDIATE MEDICAL CARE IF:  °· You have severe back pain or lower abdominal pain. °· You develop chills. °· You have nausea or vomiting. °· You have continued burning or discomfort with urination. °MAKE SURE YOU:  °· Understand these instructions. °· Will watch your condition. °· Will get help right away if you are not doing well or get worse. °  °This information is not intended to replace advice given to you by your health care provider. Make sure you discuss any questions you have with your health care provider. °  °Document Released: 03/21/2005 Document Revised: 03/02/2015 Document Reviewed: 07/20/2011 °Elsevier Interactive Patient Education ©2016 Elsevier Inc. ° ° °

## 2015-07-19 NOTE — ED Notes (Signed)
PA asked for labs to be drawn when IV is inserted.

## 2015-07-19 NOTE — ED Notes (Signed)
Before pt was able to pull pants down all the way, pt had partial bowel movement in underwear

## 2015-07-19 NOTE — Progress Notes (Signed)
Patient here to establish care for her DM type 1 dx at age 50 Here to discuss her gastroparesis  Would like refills on medications She has had a flu shot

## 2015-07-20 ENCOUNTER — Telehealth: Payer: Self-pay

## 2015-07-20 NOTE — Telephone Encounter (Signed)
Returned phone call to patient this am Patient stated she does not having any questions or concerns

## 2015-07-20 NOTE — Progress Notes (Unsigned)
ASSESSMENT: *** Stage of Change: contemplative  PLAN: 1. F/U with behavioral health consultant in two weeks 2. Psychiatric Medications: Buspar, Prozac 3. Behavioral recommendation(s):   - *** SUBJECTIVE: Pt. referred by *** for ***:  Pt. reports the following symptoms/concerns: *** Duration of problem: *** Severity: {DESC;mild/moderate/severe:33302}  OBJECTIVE: Orientation & Cognition: Oriented x3. Thought processes normal and appropriate to situation. Mood: ***. Affect: *** Appearance: *** Risk of harm to self or others: *** Substance use: *** Assessments administered: ***  Diagnosis: *** CPT Code: *** -------------------------------------------- Other(s) present in the room: {none (default) wildcard:28947}  Time spent with patient in exam room: {1-30} minutes

## 2015-07-21 LAB — URINE CULTURE

## 2015-07-22 ENCOUNTER — Telehealth (HOSPITAL_BASED_OUTPATIENT_CLINIC_OR_DEPARTMENT_OTHER): Payer: Self-pay | Admitting: Emergency Medicine

## 2015-07-22 MED FILL — CEPHALEXIN 500 MG CAPSULE: 500 | 7 days supply | Qty: 14 | Fill #0

## 2015-07-22 NOTE — Telephone Encounter (Signed)
Post ED Visit - Positive Culture Follow-up: Successful Patient Follow-Up  Culture assessed and recommendations reviewed by: []  Elenor Quinones, Pharm.D. []  Heide Guile, Pharm.D., BCPS []  Parks Neptune, Pharm.D. []  Alycia Rossetti, Pharm.D., BCPS []  [place], Pharm.D., BCPS, AAHIVP []  Legrand Como, Pharm.D., BCPS, AAHIVP []  Milus Glazier, Pharm.D. []  Stephens November, Pharm.D.  Positive urine culture E. coli  []  Patient discharged without antimicrobial prescription and treatment is now indicated [x]  Organism is resistant to prescribed ED discharge antimicrobial []  Patient with positive blood cultures  Changes discussed with ED provider: [name] PA New antibiotic prescription Stop levofloxacin, start Cephalexin 500 mg po bid x 7 days Called to [facility] and [place] patient, 07/22/15 1145   Hazle Nordmann 07/22/2015, 11:43 AM

## 2015-07-22 NOTE — Progress Notes (Signed)
ED Antimicrobial Stewardship Positive Culture Follow Up   [name] is an 50 y.o. female who presented to [facility] on 07/18/2015 with a chief complaint of  Chief Complaint  Patient presents with  . Abdominal Pain    Recent Results (from the past 720 hour(s))  C difficile quick scan w PCR reflex     Status: None   Collection Time: 06/22/15 10:58 AM  Result Value Ref Range Status   C Diff antigen NEGATIVE NEGATIVE Final   C Diff toxin NEGATIVE NEGATIVE Final   C Diff interpretation Negative for toxigenic C. difficile  Final  Gastrointestinal Panel by PCR , Stool     Status: None   Collection Time: 06/22/15 10:58 AM  Result Value Ref Range Status   Campylobacter species NOT DETECTED NOT DETECTED Final   Plesimonas shigelloides NOT DETECTED NOT DETECTED Final   Salmonella species NOT DETECTED NOT DETECTED Final   Yersinia enterocolitica NOT DETECTED NOT DETECTED Final   Vibrio species NOT DETECTED NOT DETECTED Final   Vibrio cholerae NOT DETECTED NOT DETECTED Final   Enteroaggregative E coli (EAEC) NOT DETECTED NOT DETECTED Final   Enteropathogenic E coli (EPEC) NOT DETECTED NOT DETECTED Final   Enterotoxigenic E coli (ETEC) NOT DETECTED NOT DETECTED Final   Shiga like toxin producing E coli (STEC) NOT DETECTED NOT DETECTED Final   E. coli O157 NOT DETECTED NOT DETECTED Final   Shigella/Enteroinvasive E coli (EIEC) NOT DETECTED NOT DETECTED Final   Cryptosporidium NOT DETECTED NOT DETECTED Final   Cyclospora cayetanensis NOT DETECTED NOT DETECTED Final   Entamoeba histolytica NOT DETECTED NOT DETECTED Final   Giardia lamblia NOT DETECTED NOT DETECTED Final   Adenovirus F40/41 NOT DETECTED NOT DETECTED Final   Astrovirus NOT DETECTED NOT DETECTED Final   Norovirus GI/GII NOT DETECTED NOT DETECTED Final   Rotavirus A NOT DETECTED NOT DETECTED Final   Sapovirus (I, II, IV, and V) NOT DETECTED NOT DETECTED Final  Urine culture     Status: None   Collection Time: 07/19/15  4:05  AM  Result Value Ref Range Status   Specimen Description URINE, RANDOM  Final   Special Requests NONE  Final   Culture   Final    >=100,000 COLONIES/mL ESCHERICHIA COLI Performed at [facility]    Report Status 07/21/2015 FINAL  Final   Organism ID, Bacteria ESCHERICHIA COLI  Final      Susceptibility   Escherichia coli - MIC*    AMPICILLIN >=32 RESISTANT Resistant     CEFAZOLIN <=4 SENSITIVE Sensitive     CEFTRIAXONE <=1 SENSITIVE Sensitive     CIPROFLOXACIN >=4 RESISTANT Resistant     GENTAMICIN >=16 RESISTANT Resistant     IMIPENEM <=0.25 SENSITIVE Sensitive     NITROFURANTOIN <=16 SENSITIVE Sensitive     TRIMETH/SULFA <=20 SENSITIVE Sensitive     AMPICILLIN/SULBACTAM 16 INTERMEDIATE Intermediate     PIP/TAZO <=4 SENSITIVE Sensitive     * >=100,000 COLONIES/mL ESCHERICHIA COLI    [x]  Treated with levofloxacin, organism resistant to prescribed antimicrobial  New antibiotic prescription: cephalexin 500mg  BID x7 days  ED Provider: [name], PA-C  Rise Patience, PharmD Candidate

## 2015-07-29 ENCOUNTER — Inpatient Hospital Stay (HOSPITAL_COMMUNITY)
Admission: EM | Admit: 2015-07-29 | Discharge: 2015-08-01 | DRG: 074 | Disposition: A | Discharge status: Home / Self Care | Payer: 59 | Attending: Internal Medicine | Admitting: Internal Medicine

## 2015-07-29 ENCOUNTER — Encounter (HOSPITAL_COMMUNITY): Payer: Self-pay | Admitting: Emergency Medicine

## 2015-07-29 ENCOUNTER — Emergency Department (HOSPITAL_COMMUNITY): Payer: 59

## 2015-07-29 ENCOUNTER — Inpatient Hospital Stay (HOSPITAL_COMMUNITY): Payer: 59

## 2015-07-29 DIAGNOSIS — G43A1 Cyclical vomiting, intractable: Secondary | ICD-10-CM

## 2015-07-29 DIAGNOSIS — E860 Dehydration: Secondary | ICD-10-CM | POA: Diagnosis present

## 2015-07-29 DIAGNOSIS — N12 Tubulo-interstitial nephritis, not specified as acute or chronic: Secondary | ICD-10-CM | POA: Diagnosis not present

## 2015-07-29 DIAGNOSIS — N10 Acute tubulo-interstitial nephritis: Secondary | ICD-10-CM | POA: Diagnosis present

## 2015-07-29 DIAGNOSIS — E1043 Type 1 diabetes mellitus with diabetic autonomic (poly)neuropathy: Secondary | ICD-10-CM | POA: Diagnosis not present

## 2015-07-29 DIAGNOSIS — E1010 Type 1 diabetes mellitus with ketoacidosis without coma: Secondary | ICD-10-CM | POA: Diagnosis present

## 2015-07-29 DIAGNOSIS — Z880 Allergy status to penicillin: Secondary | ICD-10-CM

## 2015-07-29 DIAGNOSIS — N189 Chronic kidney disease, unspecified: Secondary | ICD-10-CM | POA: Diagnosis present

## 2015-07-29 DIAGNOSIS — Z79899 Other long term (current) drug therapy: Secondary | ICD-10-CM | POA: Diagnosis not present

## 2015-07-29 DIAGNOSIS — F329 Major depressive disorder, single episode, unspecified: Secondary | ICD-10-CM | POA: Diagnosis present

## 2015-07-29 DIAGNOSIS — R112 Nausea with vomiting, unspecified: Secondary | ICD-10-CM | POA: Diagnosis present

## 2015-07-29 DIAGNOSIS — N181 Chronic kidney disease, stage 1: Secondary | ICD-10-CM

## 2015-07-29 DIAGNOSIS — F419 Anxiety disorder, unspecified: Secondary | ICD-10-CM | POA: Diagnosis present

## 2015-07-29 DIAGNOSIS — D509 Iron deficiency anemia, unspecified: Secondary | ICD-10-CM | POA: Diagnosis present

## 2015-07-29 DIAGNOSIS — E1143 Type 2 diabetes mellitus with diabetic autonomic (poly)neuropathy: Secondary | ICD-10-CM | POA: Diagnosis present

## 2015-07-29 DIAGNOSIS — F32A Depression, unspecified: Secondary | ICD-10-CM | POA: Diagnosis present

## 2015-07-29 DIAGNOSIS — Z794 Long term (current) use of insulin: Secondary | ICD-10-CM

## 2015-07-29 DIAGNOSIS — Z833 Family history of diabetes mellitus: Secondary | ICD-10-CM

## 2015-07-29 DIAGNOSIS — K3184 Gastroparesis: Secondary | ICD-10-CM | POA: Diagnosis present

## 2015-07-29 DIAGNOSIS — Z884 Allergy status to anesthetic agent status: Secondary | ICD-10-CM | POA: Diagnosis not present

## 2015-07-29 DIAGNOSIS — E538 Deficiency of other specified B group vitamins: Secondary | ICD-10-CM | POA: Diagnosis present

## 2015-07-29 DIAGNOSIS — T68XXXA Hypothermia, initial encounter: Secondary | ICD-10-CM | POA: Insufficient documentation

## 2015-07-29 DIAGNOSIS — E1040 Type 1 diabetes mellitus with diabetic neuropathy, unspecified: Secondary | ICD-10-CM | POA: Diagnosis present

## 2015-07-29 DIAGNOSIS — R0602 Shortness of breath: Secondary | ICD-10-CM

## 2015-07-29 DIAGNOSIS — E1022 Type 1 diabetes mellitus with diabetic chronic kidney disease: Secondary | ICD-10-CM | POA: Diagnosis present

## 2015-07-29 DIAGNOSIS — A419 Sepsis, unspecified organism: Secondary | ICD-10-CM

## 2015-07-29 DIAGNOSIS — Z8249 Family history of ischemic heart disease and other diseases of the circulatory system: Secondary | ICD-10-CM

## 2015-07-29 DIAGNOSIS — Z888 Allergy status to other drugs, medicaments and biological substances status: Secondary | ICD-10-CM

## 2015-07-29 DIAGNOSIS — E872 Acidosis, unspecified: Secondary | ICD-10-CM

## 2015-07-29 DIAGNOSIS — D631 Anemia in chronic kidney disease: Secondary | ICD-10-CM | POA: Diagnosis present

## 2015-07-29 DIAGNOSIS — F418 Other specified anxiety disorders: Secondary | ICD-10-CM

## 2015-07-29 LAB — CBC WITH DIFFERENTIAL/PLATELET
BASOS PCT: 0 %
Basophils Absolute: 0 10*3/uL (ref 0.0–0.1)
EOS ABS: 0 10*3/uL (ref 0.0–0.7)
Eosinophils Relative: 0 %
HCT: 30.8 % — ABNORMAL LOW (ref 36.0–46.0)
HEMOGLOBIN: 9.4 g/dL — AB (ref 12.0–15.0)
Lymphocytes Relative: 8 %
Lymphs Abs: 0.9 10*3/uL (ref 0.7–4.0)
MCH: 24.4 pg — ABNORMAL LOW (ref 26.0–34.0)
MCHC: 30.5 g/dL (ref 30.0–36.0)
MCV: 80 fL (ref 78.0–100.0)
Monocytes Absolute: 0.7 10*3/uL (ref 0.1–1.0)
Monocytes Relative: 6 %
NEUTROS ABS: 9.4 10*3/uL — AB (ref 1.7–7.7)
NEUTROS PCT: 86 %
Platelets: 479 10*3/uL — ABNORMAL HIGH (ref 150–400)
RBC: 3.85 MIL/uL — AB (ref 3.87–5.11)
RDW: 16 % — ABNORMAL HIGH (ref 11.5–15.5)
WBC: 11 10*3/uL — AB (ref 4.0–10.5)

## 2015-07-29 LAB — URINE MICROSCOPIC-ADD ON
RBC / HPF: NONE SEEN RBC/hpf (ref 0–5)
SQUAMOUS EPITHELIAL / LPF: NONE SEEN
WBC UA: NONE SEEN WBC/hpf (ref 0–5)

## 2015-07-29 LAB — COMPREHENSIVE METABOLIC PANEL
ALBUMIN: 4.1 g/dL (ref 3.5–5.0)
ALK PHOS: 100 U/L (ref 38–126)
ALT: 32 U/L (ref 14–54)
AST: 41 U/L (ref 15–41)
Anion gap: 15 (ref 5–15)
BUN: 15 mg/dL (ref 6–20)
CALCIUM: 10 mg/dL (ref 8.9–10.3)
CO2: 24 mmol/L (ref 22–32)
CREATININE: 0.94 mg/dL (ref 0.44–1.00)
Chloride: 101 mmol/L (ref 101–111)
GFR calc Af Amer: >60 mL/min (ref >60)
GFR calc non Af Amer: >60 mL/min (ref >60)
GLUCOSE: 438 mg/dL — AB (ref 65–99)
Potassium: 3.9 mmol/L (ref 3.5–5.1)
SODIUM: 140 mmol/L (ref 135–145)
Total Bilirubin: 1.1 mg/dL (ref 0.3–1.2)
Total Protein: 8 g/dL (ref 6.5–8.1)

## 2015-07-29 LAB — URINALYSIS, ROUTINE W REFLEX MICROSCOPIC
BILIRUBIN URINE: NEGATIVE
Glucose, UA: >1000 mg/dL — AB
HGB URINE DIPSTICK: NEGATIVE
Ketones, ur: 40 mg/dL — AB
Leukocytes, UA: NEGATIVE
NITRITE: NEGATIVE
PH: 7 (ref 5.0–8.0)
Protein, ur: 30 mg/dL — AB
SPECIFIC GRAVITY, URINE: 1.026 (ref 1.005–1.030)

## 2015-07-29 LAB — LIPASE, BLOOD: Lipase: 45 U/L (ref 11–51)

## 2015-07-29 LAB — BASIC METABOLIC PANEL
ANION GAP: 13 (ref 5–15)
BUN: 10 mg/dL (ref 6–20)
CHLORIDE: 108 mmol/L (ref 101–111)
CO2: 21 mmol/L — ABNORMAL LOW (ref 22–32)
CREATININE: 0.7 mg/dL (ref 0.44–1.00)
Calcium: 9.3 mg/dL (ref 8.9–10.3)
GFR calc non Af Amer: >60 mL/min (ref >60)
Glucose, Bld: 275 mg/dL — ABNORMAL HIGH (ref 65–99)
POTASSIUM: 3.9 mmol/L (ref 3.5–5.1)
SODIUM: 142 mmol/L (ref 135–145)

## 2015-07-29 LAB — BLOOD GAS, VENOUS
ACID-BASE EXCESS: 3.5 mmol/L — AB (ref 0.0–2.0)
Bicarbonate: 21.3 mEq/L (ref 20.0–24.0)
O2 SAT: 40.7 %
PH VEN: 7.336 — AB (ref 7.250–7.300)
Patient temperature: 98.6
TCO2: 20.6 mmol/L (ref 0–100)
pCO2, Ven: 41.1 mmHg — ABNORMAL LOW (ref 45.0–50.0)

## 2015-07-29 LAB — CBG MONITORING, ED
GLUCOSE-CAPILLARY: 391 mg/dL — AB (ref 65–99)
Glucose-Capillary: 369 mg/dL — ABNORMAL HIGH (ref 65–99)

## 2015-07-29 LAB — BLOOD GAS, ARTERIAL
Acid-base deficit: 5.2 mmol/L — ABNORMAL HIGH (ref 0.0–2.0)
Bicarbonate: 18.7 mEq/L — ABNORMAL LOW (ref 20.0–24.0)
DRAWN BY: 103701
FIO2: 0.21
O2 Saturation: 97.5 %
PH ART: 7.379 (ref 7.350–7.450)
Patient temperature: 98.6
TCO2: 17.6 mmol/L (ref 0–100)
pCO2 arterial: 32.5 mmHg — ABNORMAL LOW (ref 35.0–45.0)
pO2, Arterial: 112 mmHg — ABNORMAL HIGH (ref 80.0–100.0)

## 2015-07-29 LAB — I-STAT TROPONIN, ED: Troponin i, poc: 0 ng/mL (ref 0.00–0.08)

## 2015-07-29 LAB — I-STAT CG4 LACTIC ACID, ED: Lactic Acid, Venous: 3.27 mmol/L (ref 0.5–2.0)

## 2015-07-29 LAB — RAPID URINE DRUG SCREEN, HOSP PERFORMED
Amphetamines: NOT DETECTED
BARBITURATES: NOT DETECTED
Benzodiazepines: NOT DETECTED
Cocaine: NOT DETECTED
Opiates: NOT DETECTED
TETRAHYDROCANNABINOL: NOT DETECTED

## 2015-07-29 LAB — LACTIC ACID, PLASMA
LACTIC ACID, VENOUS: 3.8 mmol/L — AB (ref 0.5–2.0)
Lactic Acid, Venous: 3.1 mmol/L (ref 0.5–2.0)

## 2015-07-29 LAB — MAGNESIUM: Magnesium: 2 mg/dL (ref 1.7–2.4)

## 2015-07-29 LAB — GLUCOSE, CAPILLARY
GLUCOSE-CAPILLARY: 238 mg/dL — AB (ref 65–99)
GLUCOSE-CAPILLARY: 276 mg/dL — AB (ref 65–99)
Glucose-Capillary: 252 mg/dL — ABNORMAL HIGH (ref 65–99)

## 2015-07-29 MED ORDER — METOCLOPRAMIDE HCL 5 MG/ML IJ SOLN
10.0000 mg | Freq: Once | INTRAMUSCULAR | Status: AC
  Administered 2015-07-29: 10 mg via INTRAVENOUS
  Filled 2015-07-29: qty 2

## 2015-07-29 MED ORDER — CHLORHEXIDINE GLUCONATE 0.12 % MT SOLN
15.0000 mL | Freq: Two times a day (BID) | OROMUCOSAL | Status: DC
  Administered 2015-07-29 – 2015-08-01 (×6): 15 mL via OROMUCOSAL
  Filled 2015-07-29 (×5): qty 15

## 2015-07-29 MED ORDER — ACETAMINOPHEN 325 MG PO TABS
650.0000 mg | ORAL_TABLET | Freq: Four times a day (QID) | ORAL | Status: DC | PRN
  Filled 2015-07-29: qty 2

## 2015-07-29 MED ORDER — FLUOXETINE HCL 20 MG PO CAPS: 40.0000 mg | ORAL_CAPSULE | Freq: Every day | ORAL | Status: DC

## 2015-07-29 MED ORDER — ATORVASTATIN CALCIUM 10 MG PO TABS
20.0000 mg | ORAL_TABLET | Freq: Every day | ORAL | Status: DC
  Administered 2015-07-30 – 2015-08-01 (×3): 20 mg via ORAL
  Filled 2015-07-29 (×3): qty 2

## 2015-07-29 MED ORDER — BUSPIRONE HCL 5 MG PO TABS
5.0000 mg | ORAL_TABLET | Freq: Three times a day (TID) | ORAL | Status: DC
  Administered 2015-07-30 – 2015-08-01 (×8): 5 mg via ORAL
  Filled 2015-07-29 (×8): qty 1

## 2015-07-29 MED ORDER — SODIUM CHLORIDE 0.9 % IV BOLUS (SEPSIS)
1000.0000 mL | Freq: Once | INTRAVENOUS | Status: AC
  Administered 2015-07-29: 1000 mL via INTRAVENOUS

## 2015-07-29 MED ORDER — ONDANSETRON 4 MG PO TBDP
4.0000 mg | ORAL_TABLET | Freq: Once | ORAL | Status: AC | PRN
  Administered 2015-07-29: 4 mg via ORAL
  Filled 2015-07-29: qty 1

## 2015-07-29 MED ORDER — ONDANSETRON HCL 4 MG/2ML IJ SOLN
4.0000 mg | Freq: Once | INTRAMUSCULAR | Status: AC
  Administered 2015-07-29: 4 mg via INTRAVENOUS
  Filled 2015-07-29: qty 2

## 2015-07-29 MED ORDER — ONDANSETRON HCL 4 MG/2ML IJ SOLN
4.0000 mg | Freq: Four times a day (QID) | INTRAMUSCULAR | Status: DC | PRN
  Administered 2015-07-29 (×2): 4 mg via INTRAVENOUS
  Filled 2015-07-29 (×2): qty 2

## 2015-07-29 MED ORDER — DEXTROSE 5 % IV SOLN
1.0000 g | INTRAVENOUS | Status: DC
  Administered 2015-07-30 – 2015-08-01 (×3): 1 g via INTRAVENOUS
  Filled 2015-07-29 (×4): qty 10

## 2015-07-29 MED ORDER — SODIUM CHLORIDE 0.9% FLUSH: 3.0000 mL | Freq: Two times a day (BID) | INTRAVENOUS | Status: DC

## 2015-07-29 MED ORDER — ONDANSETRON 4 MG PO TBDP
4.0000 mg | ORAL_TABLET | Freq: Three times a day (TID) | ORAL | Status: DC | PRN
  Administered 2015-07-30: 4 mg via ORAL
  Filled 2015-07-29: qty 1

## 2015-07-29 MED ORDER — LORAZEPAM 2 MG/ML IJ SOLN
1.0000 mg | Freq: Once | INTRAMUSCULAR | Status: AC
  Administered 2015-07-29: 1 mg via INTRAVENOUS
  Filled 2015-07-29: qty 1

## 2015-07-29 MED ORDER — DEXTROSE 5 % IV SOLN
1.0000 g | Freq: Once | INTRAVENOUS | Status: AC
  Administered 2015-07-29: 1 g via INTRAVENOUS
  Filled 2015-07-29: qty 10

## 2015-07-29 MED ORDER — PANTOPRAZOLE SODIUM 40 MG PO TBEC
40.0000 mg | DELAYED_RELEASE_TABLET | Freq: Every day | ORAL | Status: DC
  Administered 2015-07-30 – 2015-08-01 (×3): 40 mg via ORAL
  Filled 2015-07-29 (×3): qty 1

## 2015-07-29 MED ORDER — SODIUM CHLORIDE 0.9 % IV BOLUS (SEPSIS)
500.0000 mL | Freq: Once | INTRAVENOUS | Status: AC
  Administered 2015-07-29: 500 mL via INTRAVENOUS

## 2015-07-29 MED ORDER — DRONABINOL 2.5 MG PO CAPS
5.0000 mg | ORAL_CAPSULE | Freq: Two times a day (BID) | ORAL | Status: DC
  Administered 2015-07-30 – 2015-08-01 (×4): 5 mg via ORAL
  Filled 2015-07-29 (×4): qty 2

## 2015-07-29 MED ORDER — LEVALBUTEROL HCL 0.63 MG/3ML IN NEBU: 0.6300 mg | INHALATION_SOLUTION | Freq: Four times a day (QID) | RESPIRATORY_TRACT | Status: DC | PRN

## 2015-07-29 MED ORDER — ACETAMINOPHEN 650 MG RE SUPP: 650.0000 mg | Freq: Four times a day (QID) | RECTAL | Status: DC | PRN

## 2015-07-29 MED ORDER — ENOXAPARIN SODIUM 40 MG/0.4ML ~~LOC~~ SOLN
40.0000 mg | SUBCUTANEOUS | Status: DC
  Administered 2015-07-29 – 2015-08-01 (×4): 40 mg via SUBCUTANEOUS
  Filled 2015-07-29 (×5): qty 0.4

## 2015-07-29 MED ORDER — DEXTROSE 5 % IV SOLN: 2.0000 g | Freq: Once | INTRAVENOUS | Status: DC

## 2015-07-29 MED ORDER — HALOPERIDOL LACTATE 5 MG/ML IJ SOLN
2.0000 mg | Freq: Once | INTRAMUSCULAR | Status: AC
  Administered 2015-07-29: 2 mg via INTRAVENOUS
  Filled 2015-07-29: qty 1

## 2015-07-29 MED ORDER — INSULIN DETEMIR 100 UNIT/ML ~~LOC~~ SOLN
15.0000 [IU] | Freq: Every day | SUBCUTANEOUS | Status: DC
  Administered 2015-07-29: 15 [IU] via SUBCUTANEOUS
  Filled 2015-07-29: qty 0.15

## 2015-07-29 MED ORDER — SODIUM CHLORIDE 0.9 % IV BOLUS (SEPSIS): 1000.0000 mL | Freq: Once | INTRAVENOUS | Status: DC

## 2015-07-29 MED ORDER — METOCLOPRAMIDE HCL 5 MG/ML IJ SOLN
10.0000 mg | Freq: Three times a day (TID) | INTRAMUSCULAR | Status: DC
  Administered 2015-07-29 – 2015-08-01 (×11): 10 mg via INTRAVENOUS
  Filled 2015-07-29 (×11): qty 2

## 2015-07-29 MED ORDER — ONDANSETRON HCL 4 MG PO TABS: 4.0000 mg | ORAL_TABLET | Freq: Four times a day (QID) | ORAL | Status: DC | PRN

## 2015-07-29 MED ORDER — SODIUM CHLORIDE 0.9 % IV SOLN
INTRAVENOUS | Status: DC
  Administered 2015-07-29 (×3): via INTRAVENOUS

## 2015-07-29 MED ORDER — SODIUM CHLORIDE 0.9 % IV BOLUS (SEPSIS): 250.0000 mL | Freq: Once | INTRAVENOUS | Status: DC

## 2015-07-29 MED ORDER — PROMETHAZINE HCL 25 MG/ML IJ SOLN
12.5000 mg | Freq: Once | INTRAMUSCULAR | Status: AC
  Administered 2015-07-29: 12.5 mg via INTRAVENOUS
  Filled 2015-07-29: qty 1

## 2015-07-29 MED ORDER — CETYLPYRIDINIUM CHLORIDE 0.05 % MT LIQD
7.0000 mL | Freq: Two times a day (BID) | OROMUCOSAL | Status: DC
  Administered 2015-07-29 – 2015-07-31 (×5): 7 mL via OROMUCOSAL

## 2015-07-29 MED ORDER — INSULIN ASPART 100 UNIT/ML ~~LOC~~ SOLN
0.0000 [IU] | Freq: Three times a day (TID) | SUBCUTANEOUS | Status: DC
  Administered 2015-07-29: 5 [IU] via SUBCUTANEOUS
  Administered 2015-07-29: 9 [IU] via SUBCUTANEOUS
  Administered 2015-07-29: 3 [IU] via SUBCUTANEOUS
  Filled 2015-07-29: qty 1

## 2015-07-29 MED ORDER — PREGABALIN 75 MG PO CAPS
150.0000 mg | ORAL_CAPSULE | Freq: Two times a day (BID) | ORAL | Status: DC
Start: 2015-07-29 — End: 2015-08-01
  Administered 2015-07-30 – 2015-08-01 (×5): 150 mg via ORAL
  Filled 2015-07-29 (×5): qty 2

## 2015-07-29 NOTE — ED Notes (Signed)
Per EMS, patient has a hx of gastroparesis. Patient having nausea, vomiting, weakness. This episode started about 9pm last night.

## 2015-07-29 NOTE — ED Notes (Signed)
X-ray at bedside

## 2015-07-29 NOTE — ED Notes (Signed)
hospitalist at bedside

## 2015-07-29 NOTE — Progress Notes (Signed)
CRITICAL VALUE ALERT  Critical value received:  Lactic Acid 3.8   Date of notification:  07/29/2015  Time of notification:  [id number]  Critical value read back:Yes.    Nurse who received alert:  Dorian Pod   MD notified (1st page):  Dr. Allyson Sabal   Time of first page:  1025  MD notified (2nd page):  Time of second page:  Responding MD:  Allyson Sabal   Time MD responded:  1026

## 2015-07-29 NOTE — ED Notes (Addendum)
Report given to Raquel Sarna, RN-transfer to the 4 th floor

## 2015-07-29 NOTE — Hospital Discharge Follow-Up (Signed)
Transitional Care Clinic at [facility]:  Patient known to the [facility] at [place]. This Case Manager met with patient at bedside to determine if patient wanting to follow-up at [facility] after discharge. Patient has an appointment scheduled on 08/02/15 at 1500 with Dr. Jarold Song. Patient very nauseous and indicated she could not talk. Transitional Care Case Manager will attempt to follow-up at a later time. Will follow patient's clinical progress.

## 2015-07-29 NOTE — ED Notes (Signed)
Attempted to collect blood cultures but was unsuccessful. RN,Stacey made aware. Main lab called to draw labs.

## 2015-07-29 NOTE — ED Notes (Signed)
PA at bedside.

## 2015-07-29 NOTE — ED Provider Notes (Signed)
CSN: [id number]     Arrival date & time 07/29/15  0558 History   First MD Initiated Contact with Patient 07/29/15 [phone]     Chief Complaint  Patient presents with  . Emesis     (Consider location/radiation/quality/duration/timing/severity/associated sxs/prior Treatment) The history is provided by the patient and medical records. The history is limited by the condition of the patient.     [name] is a 50 y.o. female with pmhx of type 1 DM, gastroparesis, also hx of non-compliance, recently noted secondary to financial difficulties, she presents to the ER with N, V, D and abdominal pain that began at 9 PM last night.   Level V caveat due to patient distress Remaining history obtained from chart review and EMS personnel. On January 23 she percent to the ER was diagnosed with a UTI, discharged with Levaquin. She was unable to obtain this antibiotic and urine culture revealed resistance to Levaquin, and she was then given prescription for Keflex which she also was unable to fill and take.  She presents to the ER via EMS which reports nausea, vomiting and weakness.  Past Medical History  Diagnosis Date  . Diabetes mellitus without complication [facility])    Past Surgical History  Procedure Laterality Date  . Retinal detachment surgery    . Posterior vitrectomy and membrane peel-left eye  09/28/2002  . Posterior vitrectomy and membrane peel-right eye  03/16/2002  . Eye surgery    . Gailstones    . Esophagogastroduodenoscopy  09/27/2014    at [facility], Dr Rolan Lipa. biospy neg for celiac, neg for H pylori.   . Colonoscopy  09/27/2014    at [facility]   Family History  Problem Relation Age of Onset  . Cystic fibrosis Mother   . Hypertension Father   . Diabetes Brother   . Hypertension Maternal Grandmother    Social History  Substance Use Topics  . Smoking status: Never Smoker   . Smokeless tobacco: Never Used  . Alcohol Use: No   OB History    No data available     Review of Systems   Unable to perform ROS: Acuity of condition      Allergies  Anesthetics, amide; Penicillins; Buprenorphine hcl; and Encainide  Home Medications   Prior to Admission medications   Medication Sig Start Date End Date Taking? Authorizing Provider  atorvastatin (LIPITOR) 20 MG tablet Take 1 tablet (20 mg total) by mouth daily at 6 PM. 06/25/15  Yes Allie Bossier, MD  Blood Glucose Monitoring Suppl (TRUE METRIX METER) DEVI 1 each by Does not apply route 3 (three) times daily before meals. 07/19/15  Yes [name], MD  busPIRone (BUSPAR) 5 MG tablet Take 1 tablet (5 mg total) by mouth 3 (three) times daily. 07/19/15  Yes [name], MD  dronabinol (MARINOL) 5 MG capsule Take 5 mg by mouth 2 (two) times daily. 05/16/15  Yes Historical Provider, MD  FLUoxetine (PROZAC) 40 MG capsule Take 40 mg by mouth daily.   Yes Historical Provider, MD  glucose blood (TRUE METRIX BLOOD GLUCOSE TEST) test strip Use 3 times daily before meals 07/19/15  Yes [name], MD  insulin aspart (NOVOLOG) 100 UNIT/ML injection Inject 0-15 Units into the skin 3 (three) times daily with meals. 2-15 units three times daily with food. Per sliding scale 06/25/15  Yes Allie Bossier, MD  ondansetron (ZOFRAN ODT) 4 MG disintegrating tablet Take 1 tablet (4 mg total) by mouth every 8 (eight) hours as needed for  nausea. 07/19/15  Yes [name], MD  pantoprazole (PROTONIX) 40 MG tablet Take 40 mg by mouth daily. Reported on 07/19/2015 01/04/15  Yes Historical Provider, MD  polyethylene glycol powder (GLYCOLAX/MIRALAX) powder Take 17 g by mouth daily as needed for moderate constipation. Reported on 07/19/2015 04/06/15  Yes Historical Provider, MD  pregabalin (LYRICA) 150 MG capsule Take 1 capsule (150 mg total) by mouth 2 (two) times daily. 07/19/15  Yes [name], MD  TRUEPLUS LANCETS 28G MISC 1 each by Does not apply route 3 (three) times daily before meals. 07/19/15  Yes [name], MD  cephALEXin (KEFLEX) 500 MG capsule Take  500 mg by mouth 2 (two) times daily. Reported on 07/29/2015 07/22/15   Historical Provider, MD  cyanocobalamin (,VITAMIN B-12,) 1000 MCG/ML injection Inject 1 mL (1,000 mcg total) into the muscle daily. 08/01/15   Reyne Dumas, MD  insulin detemir (LEVEMIR) 100 UNIT/ML injection Inject 0.2 mLs (20 Units total) into the skin daily. 08/01/15   Reyne Dumas, MD  metoCLOPramide (REGLAN) 10 MG tablet Take 1 tablet (10 mg total) by mouth every 6 (six) hours as needed for nausea. Patient not taking: Reported on 07/05/2015 07/03/15   Robbie Lis, MD   BP 118/61 mmHg  Pulse 101  Temp(Src) 97.6 F (36.4 C) (Oral)  Resp 18  Ht 5\' 2"  (1.575 m)  Wt 50.6 kg  BMI 20.40 kg/m2  SpO2 100%  LMP 06/26/2015 Physical Exam  Constitutional: She is oriented to person, place, and time. She appears well-developed. She appears distressed.  Thin, distressed pt, unable to answer questions due to continuous vomiting and wretching   HENT:  Head: Normocephalic and atraumatic.  Eyes: Pupils are equal, round, and reactive to light. No scleral icterus.  Neck: Normal range of motion. Neck supple.  Cardiovascular:  Regular rhythm, tachycardic, intact pulses in all extremities  Pulmonary/Chest:  Tachypnea, generally decreased BS  Abdominal: There is tenderness. There is guarding. There is no rebound.  Evaluation limited by active vomiting, generalized tenderness and guarding, no distention  Musculoskeletal: Normal range of motion.  Neurological: She is alert and oriented to person, place, and time. She exhibits normal muscle tone. Coordination normal.  Trembling with vomiting, but able to move all extremities.    Skin: Skin is warm. No rash noted. She is diaphoretic.  Poor skin turgor  Psychiatric: She has a normal mood and affect. She is noncommunicative.  Nursing note and vitals reviewed.   ED Course  Procedures (including critical care time) Labs Review Labs Reviewed  CBC WITH DIFFERENTIAL/PLATELET - Abnormal; Notable  for the following:    WBC 11.0 (*)    RBC 3.85 (*)    Hemoglobin 9.4 (*)    HCT 30.8 (*)    MCH 24.4 (*)    RDW 16.0 (*)    Platelets 479 (*)    Neutro Abs 9.4 (*)    All other components within normal limits  COMPREHENSIVE METABOLIC PANEL - Abnormal; Notable for the following:    Glucose, Bld 438 (*)    All other components within normal limits  LACTIC ACID, PLASMA - Abnormal; Notable for the following:    Lactic Acid, Venous 3.1 (*)    All other components within normal limits  LACTIC ACID, PLASMA - Abnormal; Notable for the following:    Lactic Acid, Venous 3.8 (*)    All other components within normal limits  URINALYSIS, ROUTINE W REFLEX MICROSCOPIC (NOT AT [facility]) - Abnormal; Notable for the following:  Glucose, UA >1000 (*)    Ketones, ur 40 (*)    Protein, ur 30 (*)    All other components within normal limits  BLOOD GAS, VENOUS - Abnormal; Notable for the following:    pH, Ven 7.336 (*)    pCO2, Ven 41.1 (*)    Acid-Base Excess 3.5 (*)    All other components within normal limits  HEMOGLOBIN A1C - Abnormal; Notable for the following:    Hgb A1c MFr Bld 8.7 (*)    All other components within normal limits  URINE MICROSCOPIC-ADD ON - Abnormal; Notable for the following:    Bacteria, UA RARE (*)    All other components within normal limits  BLOOD GAS, ARTERIAL - Abnormal; Notable for the following:    pCO2 arterial 32.5 (*)    pO2, Arterial 112 (*)    Bicarbonate 18.7 (*)    Acid-base deficit 5.2 (*)    All other components within normal limits  BASIC METABOLIC PANEL - Abnormal; Notable for the following:    CO2 21 (*)    Glucose, Bld 275 (*)    All other components within normal limits  GLUCOSE, CAPILLARY - Abnormal; Notable for the following:    Glucose-Capillary 238 (*)    All other components within normal limits  GLUCOSE, CAPILLARY - Abnormal; Notable for the following:    Glucose-Capillary 276 (*)    All other components within normal limits  CBC -  Abnormal; Notable for the following:    WBC 13.4 (*)    RBC 3.56 (*)    Hemoglobin 8.6 (*)    HCT 29.0 (*)    MCH 24.2 (*)    MCHC 29.7 (*)    RDW 16.4 (*)    Platelets 501 (*)    All other components within normal limits  COMPREHENSIVE METABOLIC PANEL - Abnormal; Notable for the following:    CO2 13 (*)    Glucose, Bld 393 (*)    Creatinine, Ser 1.06 (*)    Total Bilirubin 1.7 (*)    Anion gap 18 (*)    All other components within normal limits  GLUCOSE, CAPILLARY - Abnormal; Notable for the following:    Glucose-Capillary 252 (*)    All other components within normal limits  BASIC METABOLIC PANEL - Abnormal; Notable for the following:    Chloride 112 (*)    CO2 20 (*)    Glucose, Bld 103 (*)    All other components within normal limits  GLUCOSE, CAPILLARY - Abnormal; Notable for the following:    Glucose-Capillary 393 (*)    All other components within normal limits  BLOOD GAS, ARTERIAL - Abnormal; Notable for the following:    pH, Arterial 7.300 (*)    pCO2 arterial 31.9 (*)    pO2, Arterial 102 (*)    Bicarbonate 15.2 (*)    Acid-base deficit 9.9 (*)    All other components within normal limits  GLUCOSE, CAPILLARY - Abnormal; Notable for the following:    Glucose-Capillary 369 (*)    All other components within normal limits  GLUCOSE, CAPILLARY - Abnormal; Notable for the following:    Glucose-Capillary 294 (*)    All other components within normal limits  GLUCOSE, CAPILLARY - Abnormal; Notable for the following:    Glucose-Capillary 230 (*)    All other components within normal limits  BASIC METABOLIC PANEL - Abnormal; Notable for the following:    CO2 20 (*)    Glucose, Bld 213 (*)  Creatinine, Ser 1.09 (*)    GFR calc non Af Amer 59 (*)    All other components within normal limits  GLUCOSE, CAPILLARY - Abnormal; Notable for the following:    Glucose-Capillary 181 (*)    All other components within normal limits  GLUCOSE, CAPILLARY - Abnormal; Notable for  the following:    Glucose-Capillary 122 (*)    All other components within normal limits  GLUCOSE, CAPILLARY - Abnormal; Notable for the following:    Glucose-Capillary 119 (*)    All other components within normal limits  GLUCOSE, CAPILLARY - Abnormal; Notable for the following:    Glucose-Capillary 183 (*)    All other components within normal limits  GLUCOSE, CAPILLARY - Abnormal; Notable for the following:    Glucose-Capillary 110 (*)    All other components within normal limits  GLUCOSE, CAPILLARY - Abnormal; Notable for the following:    Glucose-Capillary 127 (*)    All other components within normal limits  GLUCOSE, CAPILLARY - Abnormal; Notable for the following:    Glucose-Capillary 145 (*)    All other components within normal limits  CBC WITH DIFFERENTIAL/PLATELET - Abnormal; Notable for the following:    RBC 3.13 (*)    Hemoglobin 7.6 (*)    HCT 25.1 (*)    MCH 24.3 (*)    RDW 16.5 (*)    All other components within normal limits  GLUCOSE, CAPILLARY - Abnormal; Notable for the following:    Glucose-Capillary 166 (*)    All other components within normal limits  GLUCOSE, CAPILLARY - Abnormal; Notable for the following:    Glucose-Capillary 175 (*)    All other components within normal limits  GLUCOSE, CAPILLARY - Abnormal; Notable for the following:    Glucose-Capillary 192 (*)    All other components within normal limits  GLUCOSE, CAPILLARY - Abnormal; Notable for the following:    Glucose-Capillary 163 (*)    All other components within normal limits  GLUCOSE, CAPILLARY - Abnormal; Notable for the following:    Glucose-Capillary 122 (*)    All other components within normal limits  GLUCOSE, CAPILLARY - Abnormal; Notable for the following:    Glucose-Capillary 104 (*)    All other components within normal limits  GLUCOSE, CAPILLARY - Abnormal; Notable for the following:    Glucose-Capillary 115 (*)    All other components within normal limits  COMPREHENSIVE  METABOLIC PANEL - Abnormal; Notable for the following:    Chloride 113 (*)    CO2 19 (*)    Calcium 8.7 (*)    Total Protein 5.7 (*)    Albumin 2.7 (*)    All other components within normal limits  GLUCOSE, CAPILLARY - Abnormal; Notable for the following:    Glucose-Capillary 246 (*)    All other components within normal limits  COMPREHENSIVE METABOLIC PANEL - Abnormal; Notable for the following:    Chloride 115 (*)    CO2 20 (*)    Glucose, Bld 152 (*)    Calcium 8.6 (*)    Total Protein 5.7 (*)    Albumin 2.7 (*)    All other components within normal limits  CBC - Abnormal; Notable for the following:    RBC 3.04 (*)    Hemoglobin 7.5 (*)    HCT 25.2 (*)    MCH 24.7 (*)    MCHC 29.8 (*)    RDW 16.5 (*)    All other components within normal limits  GLUCOSE, CAPILLARY - Abnormal; Notable  for the following:    Glucose-Capillary 102 (*)    All other components within normal limits  GLUCOSE, CAPILLARY - Abnormal; Notable for the following:    Glucose-Capillary 147 (*)    All other components within normal limits  GLUCOSE, CAPILLARY - Abnormal; Notable for the following:    Glucose-Capillary 61 (*)    All other components within normal limits  GLUCOSE, CAPILLARY - Abnormal; Notable for the following:    Glucose-Capillary 60 (*)    All other components within normal limits  GLUCOSE, CAPILLARY - Abnormal; Notable for the following:    Glucose-Capillary 136 (*)    All other components within normal limits  GLUCOSE, CAPILLARY - Abnormal; Notable for the following:    Glucose-Capillary 134 (*)    All other components within normal limits  GLUCOSE, CAPILLARY - Abnormal; Notable for the following:    Glucose-Capillary 215 (*)    All other components within normal limits  I-STAT CG4 LACTIC ACID, ED - Abnormal; Notable for the following:    Lactic Acid, Venous 3.27 (*)    All other components within normal limits  CBG MONITORING, ED - Abnormal; Notable for the following:     Glucose-Capillary 391 (*)    All other components within normal limits  CBG MONITORING, ED - Abnormal; Notable for the following:    Glucose-Capillary 369 (*)    All other components within normal limits  CULTURE, BLOOD (ROUTINE X 2)  CULTURE, BLOOD (ROUTINE X 2)  URINE CULTURE  LIPASE, BLOOD  URINE RAPID DRUG SCREEN, HOSP PERFORMED  MAGNESIUM  GLUCOSE, CAPILLARY  GLUCOSE, CAPILLARY  GLUCOSE, CAPILLARY  GLUCOSE, CAPILLARY  GLUCOSE, CAPILLARY  GLUCOSE, CAPILLARY  I-STAT CHEM 8, ED  I-STAT TROPOININ, ED    Imaging Review No results found. I have personally reviewed and evaluated these images and lab results as part of my medical decision-making.   EKG Interpretation   Date/Time:  Friday July 29 2015 06:50:10 EST Ventricular Rate:  117 PR Interval:  130 QRS Duration: 96 QT Interval:  351 QTC Calculation: 490 R Axis:     Text Interpretation:  Sinus tachycardia Probable anteroseptal infarct, old  Minimal ST depression, inferior leads No significant change was found  Confirmed by CAMPOS  MD, KEVIN ([id number]) on 07/29/2015 6:53:52 AM      MDM   Pt with hx of gastroparesis, type 1 DM, medical non-compliance, recent UTI, presents with severe N/V, unable to give hx, appears ill, but alert.  Initiated workup for sepsis, DKA, abdominal pain.  Pt had + lactic acid, mild leukocytosis 11.0, CBG 438, initial vitals significant for mild tachycardia, tachypnea and low temp 96.7  Dr. Venora Maples was told about the pt, and after he personally saw and evaluated pt, took over care of pt, who was subsequently admitted by Dr. Allyson Sabal.  Please see his documentation for remainder of ER course prior to admission.   Final diagnoses:  Hypothermia, initial encounter  Pyelonephritis  Sepsis, due to unspecified organism [facility])      Delsa Grana, PA-C 08/07/15 [place], MD 08/09/15 1615

## 2015-07-29 NOTE — ED Notes (Signed)
Patient layered up with warm blankets, per Venora Maples, MD.

## 2015-07-29 NOTE — ED Notes (Signed)
Bed: WA23 Expected date:  Expected time:  Means of arrival:  Comments: EMS 

## 2015-07-29 NOTE — Progress Notes (Signed)
Initial Nutrition Assessment  DOCUMENTATION CODES:   Not applicable  INTERVENTION:  - Diet advancement as medically feasible related to N/V - RD will continue to monitor for needs  NUTRITION DIAGNOSIS:   Inadequate oral intake related to acute illness, nausea, vomiting as evidenced by per patient/family report.  GOAL:   Patient will meet greater than or equal to 90% of their needs  MONITOR:   Diet advancement, PO intake, Weight trends, Labs, I & O's  REASON FOR ASSESSMENT:   Malnutrition Screening Tool  ASSESSMENT:   50 year old female with a history of type 1 diabetes, gastroparesis, intractable nausea vomiting, multiple admissions to [facility] as well as [facility] for recurrent nausea vomiting who presents to the ER for intractable nausea and vomiting that started around 9 PM last night. No diarrhea. The patient was diagnosed with a urinary tract infection on 1/23, discharged from the ED on Levaquin. Subsequently urine culture showed positive Escherichia coli which was sensitive to Rocephin, patient was subsequently called in a prescription for Keflex that she was not able to fill. She presented to the ER with severe nausea vomiting and weakness, patient found to be slightly hypothermic temperature 96.7, tachycardic with heart rate of 110, initial lactic acid 3.1, subsequently increased to 3.8, penis blood gas showed a pH of 7.336, PCO2 of 41. Repeat UA negative, urine drug screen negative, abdominal KUB negative. Initial troponin negative. Anion gap 11. Patient does have a history of diabetic ketoacidosis in the past  Pt seen for MST. BMI indicates normal weight. Pt recently arrived to the floor from ED and has, per chart, had ongoing N/V and weakness since last night (2/2) @ 2100. Pt still very nauseated during time of RD visit and denies having any intakes today due to this. She states she last ate a sandwich yesterday afternoon.   Unable to do physical assessment at  this time or obtain other information from PTA with respect to pt's comfort/current condition. No family or visitors present at this time. Per chart review, pt has lost 9 lbs (7.5% body weight) in the past 1 month which is significant for time frame; will continue to monitor weight trends. Unable to confirm malnutrition at this time due to inability to do physical or obtain further information.  Not meeting needs. Medications reviewed; Marinol BID and IV Reglan. Labs reviewed; CBGs: 238-369 mg/dL.   Diet Order:  Diet clear liquid Room service appropriate?: Yes; Fluid consistency:: Thin  Skin:  Reviewed, no issues  Last BM:  2/3  Height:   Ht Readings from Last 1 Encounters:  07/29/15 5\' 2"  (1.575 m)    Weight:   Wt Readings from Last 1 Encounters:  07/29/15 111 lb 8.8 oz (50.6 kg)    Ideal Body Weight:  50 kg (kg)  BMI:  Body mass index is 20.4 kg/(m^2).  Estimated Nutritional Needs:   Kcal:  1300-1500  Protein:  50-60 grams  Fluid:  >/= 1.8 L/day  EDUCATION NEEDS:   No education needs identified at this time     Jarome Matin, RD, LDN Inpatient Clinical Dietitian Pager # [phone] After hours/weekend pager # [phone]

## 2015-07-29 NOTE — H&P (Addendum)
[facility] History and Physical  [name] [id number] DOB: 1966-05-15 DOA: 07/29/2015  Referring physician:  PCP: [name], MD   Chief Complaint:   HPI:  50 year old female with a history of type 1 diabetes, gastroparesis, intractable nausea vomiting, multiple admissions to [facility] as well as [facility] for recurrent nausea vomiting who presents to the ER for intractable nausea and vomiting that started around 9 PM last night. No diarrhea. The patient was diagnosed with a urinary tract infection on 1/23, discharged from the ED on Levaquin. Subsequently urine culture showed positive Escherichia coli which was sensitive to Rocephin, patient was subsequently called in a prescription for Keflex that she was not able to fill. She presented to the ER with severe nausea vomiting and weakness, patient found to be slightly hypothermic temperature 96.7, tachycardic with heart rate of 110, initial lactic acid 3.1, subsequently increased to 3.8, venous  blood gas showed a pH of 7.336, PCO2 of 41. Repeat UA negative, urine drug screen negative, abdominal KUB negative. Initial troponin negative. Anion gap 11. Patient does have a history of diabetic ketoacidosis in the past      Review of Systems: negative for the following  Constitutional: Denies fever, chills, diaphoresis, appetite change and fatigue.  HEENT: Denies photophobia, eye pain, redness, hearing loss, ear pain, congestion, sore throat, rhinorrhea, sneezing, mouth sores, trouble swallowing, neck pain, neck stiffness and tinnitus.  Respiratory: Denies SOB, DOE, cough, chest tightness, and wheezing.  Cardiovascular: Denies chest pain, palpitations and leg swelling.  Gastrointestinal: Positive for nausea, vomiting, abdominal pain, diarrhea, constipation, blood in stool and abdominal distention.  Genitourinary: Denies dysuria, urgency, frequency, hematuria, flank pain and difficulty urinating.  Musculoskeletal: Denies  myalgias, back pain, joint swelling, arthralgias and gait problem.  Skin: Denies pallor, rash and wound.  Neurological: Denies dizziness, seizures, syncope, weakness, light-headedness, numbness and headaches.  Hematological: Denies adenopathy. Easy bruising, personal or family bleeding history  Psychiatric/Behavioral: Denies suicidal ideation, mood changes, confusion, nervousness, sleep disturbance and agitation       Past Medical History  Diagnosis Date  . Diabetes mellitus without complication [facility])      Past Surgical History  Procedure Laterality Date  . Retinal detachment surgery    . Posterior vitrectomy and membrane peel-left eye  09/28/2002  . Posterior vitrectomy and membrane peel-right eye  03/16/2002  . Eye surgery    . Gailstones    . Esophagogastroduodenoscopy  09/27/2014    at [facility], Dr Rolan Lipa. biospy neg for celiac, neg for H pylori.   . Colonoscopy  09/27/2014    at [facility]      Social History:  reports that she has never smoked. She has never used smokeless tobacco. She reports that she does not drink alcohol or use illicit drugs.    Allergies  Allergen Reactions  . Anesthetics, Amide Nausea And Vomiting  . Penicillins Diarrhea and Nausea And Vomiting    Has patient had a PCN reaction causing immediate rash, facial/tongue/throat swelling, SOB or lightheadedness with hypotension: No Has patient had a PCN reaction causing severe rash involving mucus membranes or skin necrosis: No Has patient had a PCN reaction that required hospitalization No Has patient had a PCN reaction occurring within the last 10 years: Yes  If all of the above answers are "NO", then may proceed with Cephalosporin use.   . Buprenorphine Hcl Rash  . Encainide Nausea And Vomiting    Family History  Problem Relation Age of Onset  . Cystic fibrosis Mother   .  Hypertension Father   . Diabetes Brother   . Hypertension Maternal Grandmother         Prior to Admission medications    Medication Sig Start Date End Date Taking? Authorizing Provider  atorvastatin (LIPITOR) 20 MG tablet Take 1 tablet (20 mg total) by mouth daily at 6 PM. Patient not taking: Reported on 07/19/2015 06/25/15   Allie Bossier, MD  Blood Glucose Monitoring Suppl (TRUE METRIX METER) DEVI 1 each by Does not apply route 3 (three) times daily before meals. 07/19/15   [name], MD  busPIRone (BUSPAR) 5 MG tablet Take 1 tablet (5 mg total) by mouth 3 (three) times daily. 07/19/15   [name], MD  dronabinol (MARINOL) 5 MG capsule Take 5 mg by mouth 2 (two) times daily. 05/16/15   Historical Provider, MD  FLUoxetine (PROZAC) 40 MG capsule Take 40 mg by mouth daily.    Historical Provider, MD  furosemide (LASIX) 40 MG tablet Take 40 mg by mouth 2 (two) times daily as needed for fluid. For fluid retention 03/29/15   Historical Provider, MD  glucose blood (TRUE METRIX BLOOD GLUCOSE TEST) test strip Use 3 times daily before meals 07/19/15   [name], MD  insulin aspart (NOVOLOG) 100 UNIT/ML injection Inject 0-15 Units into the skin 3 (three) times daily with meals. 2-15 units three times daily with food. Per sliding scale 06/25/15   Allie Bossier, MD  insulin detemir (LEVEMIR) 100 UNIT/ML injection Inject 0.15 mLs (15 Units total) into the skin at bedtime. 06/25/15   Allie Bossier, MD  levofloxacin (LEVAQUIN) 750 MG tablet Take 1 tablet (750 mg total) by mouth daily. 07/19/15   Charlann Lange, PA-C  lisinopril (PRINIVIL,ZESTRIL) 5 MG tablet Take 1 tablet (5 mg total) by mouth daily. 07/19/15   [name], MD  metoCLOPramide (REGLAN) 10 MG tablet Take 1 tablet (10 mg total) by mouth every 6 (six) hours as needed for nausea. Patient not taking: Reported on 07/05/2015 07/03/15   Robbie Lis, MD  ondansetron (ZOFRAN ODT) 4 MG disintegrating tablet Take 1 tablet (4 mg total) by mouth every 8 (eight) hours as needed for nausea. 07/19/15   [name], MD  pantoprazole (PROTONIX) 40 MG tablet Take 40 mg by mouth  daily. Reported on 07/19/2015 01/04/15   Historical Provider, MD  polyethylene glycol powder (GLYCOLAX/MIRALAX) powder Take 17 g by mouth daily as needed. Reported on 07/19/2015 04/06/15   Historical Provider, MD  pregabalin (LYRICA) 150 MG capsule Take 1 capsule (150 mg total) by mouth 2 (two) times daily. 07/19/15   [name], MD  TRUEPLUS LANCETS 28G MISC 1 each by Does not apply route 3 (three) times daily before meals. 07/19/15   [name], MD     Physical Exam: Filed Vitals:   07/29/15 [id number] 07/29/15 0830 07/29/15 0900 07/29/15 0945  BP:  167/86 166/82 159/72  Pulse:  111 105 104  Temp: 98.3 F (36.8 C)   98.1 F (36.7 C)  TempSrc: Oral   Oral  Resp:  32 17 30  Height:    5\' 2"  (1.575 m)  Weight:    50.6 kg (111 lb 8.8 oz)  SpO2:  100% 100% 100%     Constitutional: Vital signs reviewed. Patient is a well-developed and well-nourished in no acute distress and cooperative with exam. Alert and oriented x3.  Head: Normocephalic and atraumatic  Ear: TM normal bilaterally  Mouth: no erythema or exudates, MMM  Eyes: PERRL, EOMI, conjunctivae normal, No  scleral icterus.  Neck: Supple, Trachea midline normal ROM, No JVD, mass, thyromegaly, or carotid bruit present.  Cardiovascular: RRR, S1 normal, S2 normal, no MRG, pulses symmetric and intact bilaterally  Pulmonary/Chest: CTAB, no wheezes, rales, or rhonchi  Abdominal: Soft. Non-tender, non-distended, bowel sounds are normal, no masses, organomegaly, or guarding present.  GU: no CVA tenderness Musculoskeletal: No joint deformities, erythema, or stiffness, ROM full and no nontender Ext: no edema and no cyanosis, pulses palpable bilaterally (DP and PT)  Hematology: no cervical, inginal, or axillary adenopathy.  Neurological: A&O x3, Strenght is normal and symmetric bilaterally, cranial nerve II-XII are grossly intact, no focal motor deficit, sensory intact to light touch bilaterally.  Skin: Warm, dry and intact. No rash, cyanosis, or  clubbing.  Psychiatric: Normal mood and affect. speech and behavior is normal. Judgment and thought content normal. Cognition and memory are normal.      Data Review   Micro Results Recent Results (from the past 240 hour(s))  Blood Culture (routine x 2)     Status: None (Preliminary result)   Collection Time: 07/29/15  6:44 AM  Result Value Ref Range Status   Specimen Description BLOOD NECK Performed at [facility]   Final   Special Requests BOTTLES DRAWN AEROBIC AND ANAEROBIC 5ML  Final   Culture PENDING  Incomplete   Report Status PENDING  Incomplete    Radiology Reports Dg Chest Port 1 View  07/29/2015  CLINICAL DATA:  New onset since last night of nausea vomiting and possible fever EXAM: PORTABLE CHEST 1 VIEW COMPARISON:  Portable chest x-ray of June 18, 2015 FINDINGS: There is subtle increased density with a nodular configuration in the right infrahilar region. The left lung is clear. There is no pleural effusion. The heart and pulmonary vascularity are normal. The bony structures are unremarkable. IMPRESSION: Subtle increased density in the right infrahilar region may reflect early pneumonia. A mass is less likely given the normal appearance of this region approximately 5 weeks ago. Followup PA and lateral chest X-ray is recommended in 3-4 weeks following trial of antibiotic therapy to ensure resolution and exclude underlying malignancy. Electronically Signed   By: David  Martinique M.D.   On: 07/29/2015 07:55   Dg Abd Portable 2v  07/29/2015  CLINICAL DATA:  Acute abdominal pain with nausea and vomiting EXAM: PORTABLE ABDOMEN - 2 VIEW COMPARISON:  06/16/2015 FINDINGS: Clear lung bases. Relative paucity of bowel gas. No significant obstruction pattern or ileus. No free air evident. Prior cholecystectomy noted. No acute osseous finding. IMPRESSION: No acute finding by plain radiography. Electronically Signed   By: Jerilynn Mages.  Shick M.D.   On: 07/29/2015 07:53     CBC  Recent  Labs Lab 07/29/15 0639  WBC 11.0*  HGB 9.4*  HCT 30.8*  PLT 479*  MCV 80.0  MCH 24.4*  MCHC 30.5  RDW 16.0*  LYMPHSABS 0.9  MONOABS 0.7  EOSABS 0.0  BASOSABS 0.0    Chemistries   Recent Labs Lab 07/29/15 0639  NA 140  K 3.9  CL 101  CO2 24  GLUCOSE 438*  BUN 15  CREATININE 0.94  CALCIUM 10.0  MG 2.0  AST 41  ALT 32  ALKPHOS 100  BILITOT 1.1   ------------------------------------------------------------------------------------------------------------------ estimated creatinine clearance is 57.9 mL/min (by C-G formula based on Cr of 0.94). ------------------------------------------------------------------------------------------------------------------ No results for input(s): HGBA1C in the last 72 hours. ------------------------------------------------------------------------------------------------------------------ No results for input(s): CHOL, HDL, LDLCALC, TRIG, CHOLHDL, LDLDIRECT in the last 72 hours. ------------------------------------------------------------------------------------------------------------------ No results for  input(s): TSH, T4TOTAL, T3FREE, THYROIDAB in the last 72 hours.  Invalid input(s): FREET3 ------------------------------------------------------------------------------------------------------------------ No results for input(s): VITAMINB12, FOLATE, FERRITIN, TIBC, IRON, RETICCTPCT in the last 72 hours.  Coagulation profile No results for input(s): INR, PROTIME in the last 168 hours.  No results for input(s): DDIMER in the last 72 hours.  Cardiac Enzymes No results for input(s): CKMB, TROPONINI, MYOGLOBIN in the last 168 hours.  Invalid input(s): CK ------------------------------------------------------------------------------------------------------------------ Invalid input(s): POCBNP   CBG:  Recent Labs Lab 07/29/15 0640 07/29/15 0855  GLUCAP 391* 369*       EKG: Independently reviewed.     Assessment/Plan Active Problems:   Intractable Nausea & vomiting secondary to exacerbation of her gastroparesis, 9-10 admissions in 2016 alone Patient takes Reglan at home which will be continued IV Continue prn Phenergan, for her symptoms No significant electrolyte abnormalities but patient severely dehydrated given her elevated lactate Received multiple fluid boluses in the ER, currently on a clear liquid diet KUB does not show any ileus or obstruction Symptoms could be exacerbated by viral gastroenteritis vs UTI Patient is supposed to follow-up at GI clinic at [facility] for gastric stimulator and sees Dr. Rudi Rummage    Diabetic neuropathy, type I diabetes mellitus ([place]), last hemoglobin A1c was 8.9 Diabetes coordinator consultation Monitor Accu-Cheks closely Not in DKA but INR gap is increasing we'll continue to follow closely Repeat ABG now     Anxiety and depression-hold BuSpar, Prozac and Lyrica due to nausea    Diabetic gastroparesis (HCC)-management as above    Lactic acidosis-continue aggressive IV fluid hydration, likely secondary to dehydration    Acute pyelonephritis -Patient started on Rocephin for UTI, will continue and follow urine culture results        Code Status Orders        Start     Ordered   07/29/15 0754  Full code   Continuous     07/29/15 0755    Code Status History    Date Active Date Inactive Code Status Order ID Comments User Context   06/29/2015  8:27 PM 07/03/2015  5:58 PM Full Code [id number]  Robbie Lis, MD Inpatient   06/22/2015 10:58 AM 06/25/2015  6:47 PM Full Code [id number]  Radene Gunning, NP Inpatient   06/14/2015  6:29 PM 06/20/2015  9:40 PM Full Code [id number]  Domenic Polite, MD Inpatient   05/26/2015 10:02 PM 06/05/2015  7:29 PM Full Code [id number]  Reubin Milan, MD Inpatient   05/23/2015  7:17 AM 05/24/2015  6:19 PM Full Code [id number]  Norval Morton, MD Inpatient   05/17/2015 12:21 PM 05/20/2015  6:13 PM Full Code  [id number]  Eugenie Filler, MD Inpatient   05/17/2015 11:09 AM 05/17/2015 12:21 PM Full Code [id number]  Eugenie Filler, MD ED   04/19/2015  2:38 AM 04/20/2015 10:29 PM Full Code [id number]  Rise Patience, MD Inpatient   04/05/2015  5:43 AM 04/09/2015  7:25 PM Full Code [id number]  Rise Patience, MD Inpatient   04/05/2015  5:37 AM 04/05/2015  5:43 AM Full Code [id number]  Despina Hick, RN Inpatient   03/09/2015  7:42 AM 03/12/2015  7:11 PM Full Code [id number]  Theodis Blaze, MD Inpatient   02/20/2015  1:08 AM 02/22/2015  7:08 PM Full Code [id number]  Toy Baker, MD Inpatient   02/08/2015 11:43 PM 02/13/2015  3:32 PM Full Code [id number]  Lavina Hamman, MD ED   10/21/2014 12:57 AM 10/23/2014  3:14 PM Full  Code [id number]  Rise Patience, MD Inpatient   02/12/2013 11:16 PM 02/17/2013  9:22 PM Full Code [id number]  Quintella Baton, MD Inpatient   11/25/2012  1:48 PM 11/28/2012  7:04 PM Full Code [id number]  Elmarie Shiley, MD ED   11/05/2012  5:37 PM 11/07/2012  4:20 PM Full Code [id number]  Sheila Oats, MD Inpatient   04/03/2012  2:27 AM 04/05/2012  4:43 PM Full Code [id number]  Quintella Baton, MD Inpatient   03/24/2012 12:21 AM 03/28/2012  5:51 PM Full Code [id number]  Leeroy Cha, RN ED      Family Communication: bedside Disposition Plan: admit   Total time spent 55 minutes.Greater than 50% of this time was spent in counseling, explanation of diagnosis, planning of further management, and coordination of care  [facility] Pager [phone]  If 7PM-7AM, please contact night-coverage [email or web address] Password [facility] 07/29/2015, 10:29 AM

## 2015-07-29 NOTE — ED Provider Notes (Signed)
Medical screening examination/treatment/procedure(s) were conducted as a shared visit with non-physician practitioner(s) and myself.  I personally evaluated the patient during the encounter.   EKG Interpretation   Date/Time:  Friday July 29 2015 06:50:10 EST Ventricular Rate:  117 PR Interval:  130 QRS Duration: 96 QT Interval:  351 QTC Calculation: 490 R Axis:     Text Interpretation:  Sinus tachycardia Probable anteroseptal infarct, old  Minimal ST depression, inferior leads No significant change was found  Confirmed by Olivene Cookston  MD, Lennette Bihari ([id number]) on 07/29/2015 6:53:52 AM      Angiocath insertion Performed by: Hoy Morn Consent: Verbal consent obtained. Risks and benefits: risks, benefits and alternatives were discussed Time out: Immediately prior to procedure a "time out" was called to verify the correct patient, procedure, equipment, support staff and site/side marked as required. Preparation: Patient was prepped and draped in the usual sterile fashion. Vein Location: Left external jugular Gauge: 18 Normal blood return and flush without difficulty Patient tolerance: Patient tolerated the procedure well with no immediate complications.  CRITICAL CARE Performed by: Hoy Morn Total critical care time: 32 minutes Critical care time was exclusive of separately billable procedures and treating other patients. Critical care was necessary to treat or prevent imminent or life-threatening deterioration. Critical care was time spent personally by me on the following activities: development of treatment plan with patient and/or surrogate as well as nursing, discussions with consultants, evaluation of patient's response to treatment, examination of patient, obtaining history from patient or surrogate, ordering and performing treatments and interventions, ordering and review of laboratory studies, ordering and review of radiographic studies, pulse oximetry and re-evaluation of  patient's condition.   Patient to a peri-on arrival.  Patient is hypothermic with a rectal temperature of 96.7.  Warming measures.  Patient with an untreated urinary tract infection thus far.  Sensitive to Rocephin.  Patient be started on Rocephin at this time.  Patient be admitted to the hospital.  IV resuscitation.  Initial IV in the left forearm infiltrated.  Left external jugular IV placed for aggressive fluid resuscitation and lab draw.  No hypotension thus far.  Anion gap 15.  Bicarbonate 24.  No signs of diabetic ketoacidosis at this time.  Patient will need to be managed aggressively in the hospital given her elevated lactate       Jola Schmidt, MD 07/29/15 [phone]

## 2015-07-29 NOTE — ED Notes (Signed)
Spoke to main lab, states someone will be up to collect labs- [phone]

## 2015-07-30 DIAGNOSIS — E1040 Type 1 diabetes mellitus with diabetic neuropathy, unspecified: Secondary | ICD-10-CM

## 2015-07-30 DIAGNOSIS — N182 Chronic kidney disease, stage 2 (mild): Secondary | ICD-10-CM

## 2015-07-30 LAB — COMPREHENSIVE METABOLIC PANEL
ALK PHOS: 97 U/L (ref 38–126)
ALT: 29 U/L (ref 14–54)
ANION GAP: 18 — AB (ref 5–15)
AST: 27 U/L (ref 15–41)
Albumin: 3.9 g/dL (ref 3.5–5.0)
BILIRUBIN TOTAL: 1.7 mg/dL — AB (ref 0.3–1.2)
BUN: 11 mg/dL (ref 6–20)
CALCIUM: 9.3 mg/dL (ref 8.9–10.3)
CO2: 13 mmol/L — ABNORMAL LOW (ref 22–32)
Chloride: 106 mmol/L (ref 101–111)
Creatinine, Ser: 1.06 mg/dL — ABNORMAL HIGH (ref 0.44–1.00)
GLUCOSE: 393 mg/dL — AB (ref 65–99)
Potassium: 4 mmol/L (ref 3.5–5.1)
Sodium: 137 mmol/L (ref 135–145)
TOTAL PROTEIN: 7.7 g/dL (ref 6.5–8.1)

## 2015-07-30 LAB — HEMOGLOBIN A1C
HEMOGLOBIN A1C: 8.7 % — AB (ref 4.8–5.6)
Mean Plasma Glucose: 203 mg/dL

## 2015-07-30 LAB — CBC
HEMATOCRIT: 29 % — AB (ref 36.0–46.0)
HEMOGLOBIN: 8.6 g/dL — AB (ref 12.0–15.0)
MCH: 24.2 pg — AB (ref 26.0–34.0)
MCHC: 29.7 g/dL — AB (ref 30.0–36.0)
MCV: 81.5 fL (ref 78.0–100.0)
Platelets: 501 10*3/uL — ABNORMAL HIGH (ref 150–400)
RBC: 3.56 MIL/uL — ABNORMAL LOW (ref 3.87–5.11)
RDW: 16.4 % — ABNORMAL HIGH (ref 11.5–15.5)
WBC: 13.4 10*3/uL — ABNORMAL HIGH (ref 4.0–10.5)

## 2015-07-30 LAB — BASIC METABOLIC PANEL
Anion gap: 8 (ref 5–15)
BUN: 13 mg/dL (ref 6–20)
CO2: 20 mmol/L — ABNORMAL LOW (ref 22–32)
CREATININE: 0.92 mg/dL (ref 0.44–1.00)
Calcium: 9.2 mg/dL (ref 8.9–10.3)
Chloride: 112 mmol/L — ABNORMAL HIGH (ref 101–111)
GFR calc Af Amer: >60 mL/min (ref >60)
Glucose, Bld: 103 mg/dL — ABNORMAL HIGH (ref 65–99)
Potassium: 3.6 mmol/L (ref 3.5–5.1)
SODIUM: 140 mmol/L (ref 135–145)

## 2015-07-30 LAB — BLOOD GAS, ARTERIAL
ACID-BASE DEFICIT: 9.9 mmol/L — AB (ref 0.0–2.0)
BICARBONATE: 15.2 meq/L — AB (ref 20.0–24.0)
DRAWN BY: 257701
FIO2: 0.21
O2 Saturation: 96.5 %
PATIENT TEMPERATURE: 98.6
PCO2 ART: 31.9 mmHg — AB (ref 35.0–45.0)
TCO2: 14.8 mmol/L (ref 0–100)
pH, Arterial: 7.3 — ABNORMAL LOW (ref 7.350–7.450)
pO2, Arterial: 102 mmHg — ABNORMAL HIGH (ref 80.0–100.0)

## 2015-07-30 LAB — URINE CULTURE: CULTURE: NO GROWTH

## 2015-07-30 LAB — GLUCOSE, CAPILLARY
GLUCOSE-CAPILLARY: 393 mg/dL — AB (ref 65–99)
GLUCOSE-CAPILLARY: 369 mg/dL — AB (ref 65–99)
GLUCOSE-CAPILLARY: 122 mg/dL — AB (ref 65–99)
GLUCOSE-CAPILLARY: 91 mg/dL (ref 65–99)
GLUCOSE-CAPILLARY: 183 mg/dL — AB (ref 65–99)
GLUCOSE-CAPILLARY: 110 mg/dL — AB (ref 65–99)
Glucose-Capillary: 294 mg/dL — ABNORMAL HIGH (ref 65–99)
Glucose-Capillary: 230 mg/dL — ABNORMAL HIGH (ref 65–99)
Glucose-Capillary: 181 mg/dL — ABNORMAL HIGH (ref 65–99)
Glucose-Capillary: 119 mg/dL — ABNORMAL HIGH (ref 65–99)
Glucose-Capillary: 96 mg/dL (ref 65–99)
Glucose-Capillary: 127 mg/dL — ABNORMAL HIGH (ref 65–99)

## 2015-07-30 MED ORDER — DEXTROSE-NACL 5-0.45 % IV SOLN
INTRAVENOUS | Status: DC
  Administered 2015-07-30 – 2015-07-31 (×2): via INTRAVENOUS

## 2015-07-30 MED ORDER — INSULIN DETEMIR 100 UNIT/ML ~~LOC~~ SOLN
10.0000 [IU] | Freq: Once | SUBCUTANEOUS | Status: DC
  Filled 2015-07-30: qty 0.1

## 2015-07-30 MED ORDER — HYDROMORPHONE HCL 1 MG/ML IJ SOLN
0.5000 mg | INTRAMUSCULAR | Status: DC | PRN
  Administered 2015-07-30 – 2015-07-31 (×2): 0.5 mg via INTRAVENOUS
  Filled 2015-07-30 (×2): qty 1

## 2015-07-30 MED ORDER — PROMETHAZINE HCL 25 MG/ML IJ SOLN: 6.2500 mg | Freq: Four times a day (QID) | INTRAMUSCULAR | Status: DC | PRN

## 2015-07-30 MED ORDER — INSULIN GLARGINE 100 UNIT/ML ~~LOC~~ SOLN: 10.0000 [IU] | Freq: Once | SUBCUTANEOUS | Status: DC

## 2015-07-30 MED ORDER — INSULIN DETEMIR 100 UNIT/ML ~~LOC~~ SOLN: 25.0000 [IU] | Freq: Every day | SUBCUTANEOUS | Status: DC

## 2015-07-30 MED ORDER — TRAMADOL HCL 50 MG PO TABS
50.0000 mg | ORAL_TABLET | Freq: Four times a day (QID) | ORAL | Status: DC | PRN
  Administered 2015-07-30: 50 mg via ORAL
  Filled 2015-07-30: qty 1

## 2015-07-30 MED ORDER — PROMETHAZINE HCL 25 MG/ML IJ SOLN
12.5000 mg | Freq: Once | INTRAMUSCULAR | Status: AC
  Administered 2015-07-30: 12.5 mg via INTRAVENOUS
  Filled 2015-07-30: qty 1

## 2015-07-30 MED ORDER — SODIUM CHLORIDE 0.9 % IV SOLN: INTRAVENOUS | Status: AC

## 2015-07-30 MED ORDER — INSULIN REGULAR HUMAN 100 UNIT/ML IJ SOLN
INTRAMUSCULAR | Status: DC
  Administered 2015-07-30: 3.1 [IU]/h via INTRAVENOUS
  Filled 2015-07-30: qty 2.5

## 2015-07-30 MED ORDER — PROMETHAZINE HCL 25 MG/ML IJ SOLN: 12.5000 mg | Freq: Four times a day (QID) | INTRAMUSCULAR | Status: DC | PRN

## 2015-07-30 MED ORDER — SODIUM CHLORIDE 0.9 % IV SOLN
INTRAVENOUS | Status: DC
  Administered 2015-07-30 – 2015-08-01 (×5): via INTRAVENOUS

## 2015-07-30 NOTE — Progress Notes (Addendum)
[facility] PROGRESS NOTE  [name] [id number] DOB: [birth year] DOA: 07/29/2015 PCP: [name], MD  Length of stay: 1   Assessment/Plan: Active Problems:   Nausea & vomiting   Diabetic neuropathy, type I diabetes mellitus ([place])   Anemia of chronic kidney failure   Anxiety and depression   Diabetic gastroparesis (HCC)   Lactic acidosis   Acute pyelonephritis   Hypothermia   Brief summary 50 year old female with a history of type 1 diabetes, gastroparesis, intractable nausea vomiting, multiple admissions to [facility] as well as [facility] for recurrent nausea vomiting who presents to the ER for intractable nausea and vomiting that started around 9 PM last night. No diarrhea. The patient was diagnosed with a urinary tract infection on 1/23, discharged from the ED on Levaquin. Subsequently urine culture showed positive Escherichia coli which was sensitive to Rocephin, patient was subsequently called in a prescription for Keflex that she was not able to fill. She presented to the ER with severe nausea vomiting and weakness, patient found to be slightly hypothermic temperature 96.7, tachycardic with heart rate of 110, initial lactic acid 3.1, subsequently increased to 3.8, venous blood gas showed a pH of 7.336, PCO2 of 41. Repeat UA negative, urine drug screen negative, abdominal KUB negative. Initial troponin negative. Anion gap 11. Patient does have a history of diabetic ketoacidosis in the past  Assessment and plan Intractable Nausea & vomiting secondary to exacerbation of her gastroparesis, 9-10 admissions in 2016 alone Patient takes Reglan at home which will be continued IV Also started on prn Phenergan, for her symptoms No significant electrolyte abnormalities but patient severely dehydrated given her elevated lactate Received multiple fluid boluses in the ER, currently on a clear liquid diet KUB does not show any ileus or obstruction Symptoms could be  exacerbated by viral gastroenteritis vs UTI Patient is supposed to follow-up at GI clinic at [facility] for gastric stimulator and sees Dr. Rudi Rummage Patient seems to be in DKA today, bicarbonate change from 21>13, anion gap is gone up from 13>18 Patient transition to insulin drip per DKA protocol    Diabetes mellitus, insulin-dependent complicated by Diabetic neuropathy, type I diabetes mellitus ([place]), last hemoglobin A1c was 8.9 Diabetes coordinator consultation    Anxiety and depression-hold BuSpar, Prozac and Lyrica due to nausea   Diabetic gastroparesis (HCC)-management as above   Lactic acidosis-continue aggressive IV fluid hydration, likely secondary to dehydration   Acute pyelonephritis-Continue Rocephin, follow urine culture    DVT prophylaxsis Lovenox   Code Status:      Code Status Orders        Start     Ordered   07/29/15 0754  Full code   Continuous     07/29/15 0755    Code Status History    Date Active Date Inactive Code Status Order ID Comments User Context     Family Communication: Discussed in detail with the patient, all imaging results, lab results explained to the patient   Disposition Plan:  As above       Consultants:  None   Procedures:  None   Antibiotics: Anti-infectives    Start     Dose/Rate Route Frequency Ordered Stop   07/30/15 0600  cefTRIAXone (ROCEPHIN) 1 g in dextrose 5 % 50 mL IVPB     1 g 100 mL/hr over 30 Minutes Intravenous Every 24 hours 07/29/15 0752     07/29/15 0700  cefTRIAXone (ROCEPHIN) 1 g in dextrose 5 %  50 mL IVPB     1 g 100 mL/hr over 30 Minutes Intravenous  Once 07/29/15 0657 07/29/15 0735   07/29/15 0645  cefTRIAXone (ROCEPHIN) 2 g in dextrose 5 % 50 mL IVPB  Status:  Discontinued     2 g 100 mL/hr over 30 Minutes Intravenous  Once 07/29/15 0639 07/29/15 0653         HPI/Subjective: Continues to be nauseous, complaining of abdominal pain, tearful  Objective: Filed Vitals:   07/29/15  0900 07/29/15 0945 07/29/15 1448 07/29/15 2140  BP: 166/82 159/72 159/81 134/67  Pulse: 105 104 110 113  Temp:  98.1 F (36.7 C) 98.1 F (36.7 C) 98.4 F (36.9 C)  TempSrc:  Oral Oral Oral  Resp: 17 30 22 23   Height:  5\' 2"  (1.575 m)    Weight:  50.6 kg (111 lb 8.8 oz)    SpO2: 100% 100% 99% 100%    Intake/Output Summary (Last 24 hours) at 07/30/15 1036 Last data filed at 07/30/15 [id number]  Gross per 24 hour  Intake    675 ml  Output    575 ml  Net    100 ml    Exam:  General: No acute respiratory distress Lungs: Clear to auscultation bilaterally without wheezes or crackles Cardiovascular: Regular rate and rhythm without murmur gallop or rub normal S1 and S2 AbdomenDiffusely tender but without rebound , nondistended, soft, bowel sounds positive, no rebound, no ascites, no appreciable mass Extremities: No significant cyanosis, clubbing, or edema bilateral lower extremities     Data Review   Micro Results Recent Results (from the past 240 hour(s))  Blood Culture (routine x 2)     Status: None (Preliminary result)   Collection Time: 07/29/15  6:44 AM  Result Value Ref Range Status   Specimen Description BLOOD NECK Performed at [facility]   Final   Special Requests BOTTLES DRAWN AEROBIC AND ANAEROBIC 5ML  Final   Culture PENDING  Incomplete   Report Status PENDING  Incomplete    Radiology Reports Dg Chest Port 1 View  07/29/2015  CLINICAL DATA:  Nausea/ vomiting, UTI EXAM: PORTABLE CHEST 1 VIEW COMPARISON:  07/29/2015 at 0705 hours FINDINGS: Lungs are clear. Right lower lobe opacity noted on recent chest radiograph earlier today is not apparent on the current study. No pleural effusion or pneumothorax. The heart is normal in size. Visualized osseous structures are within normal limits. IMPRESSION: No evidence of acute cardiopulmonary disease. Right lower lobe opacity noted on chest radiograph earlier today is not evident on the current study. Electronically Signed    By: Julian Hy M.D.   On: 07/29/2015 11:48   Dg Chest Port 1 View  07/29/2015  CLINICAL DATA:  New onset since last night of nausea vomiting and possible fever EXAM: PORTABLE CHEST 1 VIEW COMPARISON:  Portable chest x-ray of June 18, 2015 FINDINGS: There is subtle increased density with a nodular configuration in the right infrahilar region. The left lung is clear. There is no pleural effusion. The heart and pulmonary vascularity are normal. The bony structures are unremarkable. IMPRESSION: Subtle increased density in the right infrahilar region may reflect early pneumonia. A mass is less likely given the normal appearance of this region approximately 5 weeks ago. Followup PA and lateral chest X-ray is recommended in 3-4 weeks following trial of antibiotic therapy to ensure resolution and exclude underlying malignancy. Electronically Signed   By: David  Martinique M.D.   On: 07/29/2015 07:55   Dg Abd Portable  2v  07/29/2015  CLINICAL DATA:  Acute abdominal pain with nausea and vomiting EXAM: PORTABLE ABDOMEN - 2 VIEW COMPARISON:  06/16/2015 FINDINGS: Clear lung bases. Relative paucity of bowel gas. No significant obstruction pattern or ileus. No free air evident. Prior cholecystectomy noted. No acute osseous finding. IMPRESSION: No acute finding by plain radiography. Electronically Signed   By: Jerilynn Mages.  Shick M.D.   On: 07/29/2015 07:53     CBC  Recent Labs Lab 07/29/15 0639 07/30/15 0532  WBC 11.0* 13.4*  HGB 9.4* 8.6*  HCT 30.8* 29.0*  PLT 479* 501*  MCV 80.0 81.5  MCH 24.4* 24.2*  MCHC 30.5 29.7*  RDW 16.0* 16.4*  LYMPHSABS 0.9  --   MONOABS 0.7  --   EOSABS 0.0  --   BASOSABS 0.0  --     Chemistries   Recent Labs Lab 07/29/15 0639 07/29/15 1437 07/30/15 0532  NA 140 142 137  K 3.9 3.9 4.0  CL 101 108 106  CO2 24 21* 13*  GLUCOSE 438* 275* 393*  BUN 15 10 11   CREATININE 0.94 0.70 1.06*  CALCIUM 10.0 9.3 9.3  MG 2.0  --   --   AST 41  --  27  ALT 32  --  29  ALKPHOS  100  --  97  BILITOT 1.1  --  1.7*   ------------------------------------------------------------------------------------------------------------------ estimated creatinine clearance is 51.3 mL/min (by C-G formula based on Cr of 1.06). ------------------------------------------------------------------------------------------------------------------  Recent Labs  07/29/15 0639  HGBA1C 8.7*   ------------------------------------------------------------------------------------------------------------------ No results for input(s): CHOL, HDL, LDLCALC, TRIG, CHOLHDL, LDLDIRECT in the last 72 hours. ------------------------------------------------------------------------------------------------------------------ No results for input(s): TSH, T4TOTAL, T3FREE, THYROIDAB in the last 72 hours.  Invalid input(s): FREET3 ------------------------------------------------------------------------------------------------------------------ No results for input(s): VITAMINB12, FOLATE, FERRITIN, TIBC, IRON, RETICCTPCT in the last 72 hours.  Coagulation profile No results for input(s): INR, PROTIME in the last 168 hours.  No results for input(s): DDIMER in the last 72 hours.  Cardiac Enzymes No results for input(s): CKMB, TROPONINI, MYOGLOBIN in the last 168 hours.  Invalid input(s): CK ------------------------------------------------------------------------------------------------------------------ Invalid input(s): POCBNP   CBG:  Recent Labs Lab 07/29/15 0855 07/29/15 1144 07/29/15 1602 07/29/15 2146 07/30/15 0928  GLUCAP 369* 238* 276* 252* 393*       Studies: Dg Chest Port 1 View  07/29/2015  CLINICAL DATA:  Nausea/ vomiting, UTI EXAM: PORTABLE CHEST 1 VIEW COMPARISON:  07/29/2015 at 0705 hours FINDINGS: Lungs are clear. Right lower lobe opacity noted on recent chest radiograph earlier today is not apparent on the current study. No pleural effusion or pneumothorax. The heart is  normal in size. Visualized osseous structures are within normal limits. IMPRESSION: No evidence of acute cardiopulmonary disease. Right lower lobe opacity noted on chest radiograph earlier today is not evident on the current study. Electronically Signed   By: Julian Hy M.D.   On: 07/29/2015 11:48   Dg Chest Port 1 View  07/29/2015  CLINICAL DATA:  New onset since last night of nausea vomiting and possible fever EXAM: PORTABLE CHEST 1 VIEW COMPARISON:  Portable chest x-ray of June 18, 2015 FINDINGS: There is subtle increased density with a nodular configuration in the right infrahilar region. The left lung is clear. There is no pleural effusion. The heart and pulmonary vascularity are normal. The bony structures are unremarkable. IMPRESSION: Subtle increased density in the right infrahilar region may reflect early pneumonia. A mass is less likely given the normal appearance of this region approximately 5 weeks ago. Followup PA  and lateral chest X-ray is recommended in 3-4 weeks following trial of antibiotic therapy to ensure resolution and exclude underlying malignancy. Electronically Signed   By: David  Martinique M.D.   On: 07/29/2015 07:55   Dg Abd Portable 2v  07/29/2015  CLINICAL DATA:  Acute abdominal pain with nausea and vomiting EXAM: PORTABLE ABDOMEN - 2 VIEW COMPARISON:  06/16/2015 FINDINGS: Clear lung bases. Relative paucity of bowel gas. No significant obstruction pattern or ileus. No free air evident. Prior cholecystectomy noted. No acute osseous finding. IMPRESSION: No acute finding by plain radiography. Electronically Signed   By: Jerilynn Mages.  Shick M.D.   On: 07/29/2015 07:53      Lab Results  Component Value Date   HGBA1C 8.7* 07/29/2015   HGBA1C 8.0 07/19/2015   HGBA1C 7.3* 05/18/2015   Lab Results  Component Value Date   LDLCALC 116* 04/04/2012   CREATININE 1.06* 07/30/2015       Scheduled Meds: . antiseptic oral rinse  7 mL Mouth Rinse q12n4p  . atorvastatin  20 mg Oral  q1800  . busPIRone  5 mg Oral TID  . cefTRIAXone (ROCEPHIN)  IV  1 g Intravenous Q24H  . chlorhexidine  15 mL Mouth Rinse BID  . dronabinol  5 mg Oral BID  . enoxaparin (LOVENOX) injection  40 mg Subcutaneous Q24H  . insulin aspart  0-9 Units Subcutaneous TID WC  . metoCLOPramide (REGLAN) injection  10 mg Intravenous 3 times per day  . pantoprazole  40 mg Oral Daily  . pregabalin  150 mg Oral BID  . sodium chloride  1,000 mL Intravenous Once  . sodium chloride  250 mL Intravenous Once  . sodium chloride flush  3 mL Intravenous Q12H   Continuous Infusions: . sodium chloride    . sodium chloride    . dextrose 5 % and 0.45% NaCl    . insulin (NOVOLIN-R) infusion      Active Problems:   Nausea & vomiting   Diabetic neuropathy, type I diabetes mellitus (HCC)   Anemia of chronic kidney failure   Anxiety and depression   Diabetic gastroparesis (HCC)   Lactic acidosis   Acute pyelonephritis   Hypothermia    Time spent: 45 minutes   [facility] Pager [phone]. If 7PM-7AM, please contact night-coverage at [email or web address], password [facility] 07/30/2015, 10:36 AM  LOS: 1 day

## 2015-07-31 LAB — COMPREHENSIVE METABOLIC PANEL
ALBUMIN: 2.7 g/dL — AB (ref 3.5–5.0)
ALK PHOS: 70 U/L (ref 38–126)
ALT: 26 U/L (ref 14–54)
ANION GAP: 6 (ref 5–15)
AST: 38 U/L (ref 15–41)
BUN: 14 mg/dL (ref 6–20)
CO2: 19 mmol/L — AB (ref 22–32)
Calcium: 8.7 mg/dL — ABNORMAL LOW (ref 8.9–10.3)
Chloride: 113 mmol/L — ABNORMAL HIGH (ref 101–111)
Creatinine, Ser: 0.9 mg/dL (ref 0.44–1.00)
GFR calc Af Amer: >60 mL/min (ref >60)
GFR calc non Af Amer: >60 mL/min (ref >60)
GLUCOSE: 82 mg/dL (ref 65–99)
POTASSIUM: 3.8 mmol/L (ref 3.5–5.1)
SODIUM: 138 mmol/L (ref 135–145)
Total Bilirubin: 0.4 mg/dL (ref 0.3–1.2)
Total Protein: 5.7 g/dL — ABNORMAL LOW (ref 6.5–8.1)

## 2015-07-31 LAB — CBC WITH DIFFERENTIAL/PLATELET
BASOS PCT: 0 %
Basophils Absolute: 0 10*3/uL (ref 0.0–0.1)
EOS ABS: 0.2 10*3/uL (ref 0.0–0.7)
Eosinophils Relative: 3 %
HCT: 25.1 % — ABNORMAL LOW (ref 36.0–46.0)
HEMOGLOBIN: 7.6 g/dL — AB (ref 12.0–15.0)
Lymphocytes Relative: 29 %
Lymphs Abs: 1.9 10*3/uL (ref 0.7–4.0)
MCH: 24.3 pg — AB (ref 26.0–34.0)
MCHC: 30.3 g/dL (ref 30.0–36.0)
MCV: 80.2 fL (ref 78.0–100.0)
MONO ABS: 0.6 10*3/uL (ref 0.1–1.0)
MONOS PCT: 9 %
NEUTROS PCT: 59 %
Neutro Abs: 3.8 10*3/uL (ref 1.7–7.7)
Platelets: 369 10*3/uL (ref 150–400)
RBC: 3.13 MIL/uL — ABNORMAL LOW (ref 3.87–5.11)
RDW: 16.5 % — AB (ref 11.5–15.5)
WBC: 6.5 10*3/uL (ref 4.0–10.5)

## 2015-07-31 LAB — GLUCOSE, CAPILLARY
GLUCOSE-CAPILLARY: 166 mg/dL — AB (ref 65–99)
GLUCOSE-CAPILLARY: 122 mg/dL — AB (ref 65–99)
GLUCOSE-CAPILLARY: 104 mg/dL — AB (ref 65–99)
GLUCOSE-CAPILLARY: 147 mg/dL — AB (ref 65–99)
Glucose-Capillary: 102 mg/dL — ABNORMAL HIGH (ref 65–99)
Glucose-Capillary: 115 mg/dL — ABNORMAL HIGH (ref 65–99)
Glucose-Capillary: 145 mg/dL — ABNORMAL HIGH (ref 65–99)
Glucose-Capillary: 163 mg/dL — ABNORMAL HIGH (ref 65–99)
Glucose-Capillary: 175 mg/dL — ABNORMAL HIGH (ref 65–99)
Glucose-Capillary: 192 mg/dL — ABNORMAL HIGH (ref 65–99)
Glucose-Capillary: 246 mg/dL — ABNORMAL HIGH (ref 65–99)
Glucose-Capillary: 67 mg/dL (ref 65–99)
Glucose-Capillary: 67 mg/dL (ref 65–99)

## 2015-07-31 LAB — BASIC METABOLIC PANEL
Anion gap: 8 (ref 5–15)
BUN: 14 mg/dL (ref 6–20)
CALCIUM: 8.9 mg/dL (ref 8.9–10.3)
CO2: 20 mmol/L — ABNORMAL LOW (ref 22–32)
CREATININE: 1.09 mg/dL — AB (ref 0.44–1.00)
Chloride: 109 mmol/L (ref 101–111)
GFR calc non Af Amer: 59 mL/min — ABNORMAL LOW (ref >60)
GLUCOSE: 213 mg/dL — AB (ref 65–99)
Potassium: 3.6 mmol/L (ref 3.5–5.1)
Sodium: 137 mmol/L (ref 135–145)

## 2015-07-31 MED ORDER — SODIUM CHLORIDE 0.9 % IV BOLUS (SEPSIS)
500.0000 mL | Freq: Once | INTRAVENOUS | Status: AC
  Administered 2015-07-31: 500 mL via INTRAVENOUS

## 2015-07-31 MED ORDER — INSULIN ASPART 100 UNIT/ML ~~LOC~~ SOLN
0.0000 [IU] | SUBCUTANEOUS | Status: DC
  Administered 2015-07-31: 3 [IU] via SUBCUTANEOUS
  Administered 2015-07-31 – 2015-08-01 (×3): 1 [IU] via SUBCUTANEOUS
  Administered 2015-08-01: 2 [IU] via SUBCUTANEOUS

## 2015-07-31 MED ORDER — CYANOCOBALAMIN 1000 MCG/ML IJ SOLN
1000.0000 ug | Freq: Every day | INTRAMUSCULAR | Status: DC
  Administered 2015-07-31 – 2015-08-01 (×2): 1000 ug via INTRAMUSCULAR
  Filled 2015-07-31 (×2): qty 1

## 2015-07-31 MED ORDER — INSULIN DETEMIR 100 UNIT/ML ~~LOC~~ SOLN
25.0000 [IU] | Freq: Every day | SUBCUTANEOUS | Status: DC
  Administered 2015-07-31: 25 [IU] via SUBCUTANEOUS
  Filled 2015-07-31 (×2): qty 0.25

## 2015-07-31 MED ORDER — SODIUM CHLORIDE 0.9 % IV SOLN
125.0000 mg | Freq: Once | INTRAVENOUS | Status: AC
  Administered 2015-07-31: 125 mg via INTRAVENOUS
  Filled 2015-07-31: qty 10

## 2015-07-31 MED ORDER — DOCUSATE SODIUM 100 MG PO CAPS
200.0000 mg | ORAL_CAPSULE | Freq: Once | ORAL | Status: AC
  Administered 2015-07-31: 200 mg via ORAL
  Filled 2015-07-31: qty 2

## 2015-07-31 MED ORDER — SODIUM CHLORIDE 0.9 % IV BOLUS (SEPSIS)
1000.0000 mL | Freq: Once | INTRAVENOUS | Status: AC
  Administered 2015-07-31: 1000 mL via INTRAVENOUS

## 2015-07-31 NOTE — Progress Notes (Signed)
Pt BP 82/47, HR 92. MD notified. Adm 500 ml NS bolus. Will continue to monitor.

## 2015-07-31 NOTE — Progress Notes (Signed)
Utilization Review Completed.[name] T2/10/2015  

## 2015-07-31 NOTE — Progress Notes (Signed)
Hypoglycemic Event  CBG: 67  Treatment: 15 GM carbohydrate snack  Symptoms: None  Follow-up CBG: Time:1752 CBG Result:102  Possible Reasons for Event: Unknown  Comments/MD notified: hypoglycemic protocol    Illa Level

## 2015-07-31 NOTE — Progress Notes (Signed)
Hypoglycemic Event  CBG: 67  Treatment: 4oz apple juice  Symptoms: none  Follow-up CBG: Time: Final time [id number] CBG Result: 81  Possible Reasons for Event: unknown  Comments/MD notified:protocol followed    Harlene Ramus A

## 2015-07-31 NOTE — Progress Notes (Addendum)
[facility] PROGRESS NOTE  [name] [id number] DOB: 08-17-1965 DOA: 07/29/2015 PCP: [name], MD  Length of stay: 2   Assessment/Plan: Active Problems:   Nausea & vomiting   Diabetic neuropathy, type I diabetes mellitus ([place])   Anemia of chronic kidney failure   Anxiety and depression   Diabetic gastroparesis (HCC)   Lactic acidosis   Acute pyelonephritis   Hypothermia   Brief summary 50 year old female with a history of type 1 diabetes, gastroparesis, intractable nausea vomiting, multiple admissions to [facility] as well as [facility] for recurrent nausea vomiting who presents to the ER for intractable nausea and vomiting that started around 9 PM last night. No diarrhea. The patient was diagnosed with a urinary tract infection on 1/23, discharged from the ED on Levaquin. Subsequently urine culture showed positive Escherichia coli which was sensitive to Rocephin, patient was subsequently called in a prescription for Keflex that she was not able to fill. She presented to the ER with severe nausea vomiting and weakness, patient found to be slightly hypothermic temperature 96.7, tachycardic with heart rate of 110, initial lactic acid 3.1, subsequently increased to 3.8, venous blood gas showed a pH of 7.336, PCO2 of 41. Repeat UA negative, urine drug screen negative, abdominal KUB negative. Initial troponin negative. Anion gap 11 on admission. Patient subsequently developed DKA requiring transition to insulin drip per 24 hours, now improving   Assessment and plan Intractable Nausea & vomiting secondary to exacerbation of her gastroparesis, 9-10 admissions in 2016 alone vs DKA  Continue Reglan, Phenergan prn Received multiple fluid boluses in the ER, currently on a clear liquid diet, advanced to carb Modified diet KUB does not show any ileus or obstruction Symptoms could be exacerbated by viral gastroenteritis vs UTI vs DKA  Patient is supposed to follow-up  at GI clinic at [facility] for gastric stimulator and sees Dr. Rudi Rummage Patient states that she would like to advance her diet and is feeling better today    DKA-resolved Today  anion gap 8, bicarbonate 20 Insulin drip was discontinued last night Patient has been restarted on Levemir and sliding scale insulin We'll continue to follow serial BMP Hemoglobin A1c 8.7  Diabetic neuropathy, continue Lyrica   Anxiety and depression-hold BuSpar, Prozac in the setting of nausea   Diabetic gastroparesis (HCC)-management as above, continue IV Reglan   Lactic acidosis-continue aggressive IV fluid hydration, likely secondary to dehydration   Acute pyelonephritis-Continue Rocephin, most recent positive urine culture was from 1/26 that showed Escherichia coli  Anemia-multifactorial, anemia from chronic disease, vitamin B-12 deficiency-(147 ), most recent anemia panel done on 12/28 showed iron level of 12, normal TIBC Will start intramuscular vitamin B-12 injections and also give 1 dose of IV Venofere , as patient will not be able to take oral iron tabs in the setting of her severe gastroparesis No active bleeding, check FOBT  DVT prophylaxsis Lovenox   Code Status:      Code Status Orders        Start     Ordered   07/29/15 0754  Full code   Continuous     07/29/15 0755    Code Status History    Date Active Date Inactive Code Status Order ID Comments User Context     Family Communication: Discussed in detail with the patient, all imaging results, lab results explained to the patient   Disposition Plan:  As above       Consultants:  None  Procedures:  None   Antibiotics: Anti-infectives    Start     Dose/Rate Route Frequency Ordered Stop   07/30/15 0600  cefTRIAXone (ROCEPHIN) 1 g in dextrose 5 % 50 mL IVPB     1 g 100 mL/hr over 30 Minutes Intravenous Every 24 hours 07/29/15 0752     07/29/15 0700  cefTRIAXone (ROCEPHIN) 1 g in dextrose 5 % 50 mL IVPB     1  g 100 mL/hr over 30 Minutes Intravenous  Once 07/29/15 0657 07/29/15 0735   07/29/15 0645  cefTRIAXone (ROCEPHIN) 2 g in dextrose 5 % 50 mL IVPB  Status:  Discontinued     2 g 100 mL/hr over 30 Minutes Intravenous  Once 07/29/15 0639 07/29/15 0653         HPI/Subjective: Continues to be nauseous, complaining of abdominal pain, tearful  Objective: Filed Vitals:   07/30/15 1430 07/30/15 2305 07/31/15 0215 07/31/15 0538  BP: 90/45 105/53 91/46 102/53  Pulse: 100 93 85 86  Temp: 98.1 F (36.7 C) 98.1 F (36.7 C) 98.3 F (36.8 C) 97.8 F (36.6 C)  TempSrc: Oral Oral Oral Oral  Resp: 20 14 12 16   Height:      Weight:      SpO2: 100% 100% 94% 100%    Intake/Output Summary (Last 24 hours) at 07/31/15 1005 Last data filed at 07/31/15 0346  Gross per 24 hour  Intake 856.25 ml  Output   1350 ml  Net -493.75 ml    Exam:  General: No acute respiratory distress Lungs: Clear to auscultation bilaterally without wheezes or crackles Cardiovascular: Regular rate and rhythm without murmur gallop or rub normal S1 and S2 AbdomenDiffusely tender but without rebound , nondistended, soft, bowel sounds positive, no rebound, no ascites, no appreciable mass Extremities: No significant cyanosis, clubbing, or edema bilateral lower extremities     Data Review   Micro Results Recent Results (from the past 240 hour(s))  Blood Culture (routine x 2)     Status: None (Preliminary result)   Collection Time: 07/29/15  6:44 AM  Result Value Ref Range Status   Specimen Description BLOOD NECK  Final   Special Requests BOTTLES DRAWN AEROBIC AND ANAEROBIC 5ML  Final   Culture   Final    NO GROWTH 1 DAY Performed at [facility]    Report Status PENDING  Incomplete  Urine culture     Status: None   Collection Time: 07/29/15  7:01 AM  Result Value Ref Range Status   Specimen Description URINE, CLEAN CATCH  Final   Special Requests NONE  Final   Culture   Final    NO GROWTH 1  DAY Performed at [facility]    Report Status 07/30/2015 FINAL  Final  Blood Culture (routine x 2)     Status: None (Preliminary result)   Collection Time: 07/29/15  7:45 AM  Result Value Ref Range Status   Specimen Description BLOOD RIGHT WRIST  Final   Special Requests IN PEDIATRIC BOTTLE 3CC  Final   Culture   Final    NO GROWTH 1 DAY Performed at [facility]    Report Status PENDING  Incomplete    Radiology Reports Dg Chest Port 1 View  07/29/2015  CLINICAL DATA:  Nausea/ vomiting, UTI EXAM: PORTABLE CHEST 1 VIEW COMPARISON:  07/29/2015 at 0705 hours FINDINGS: Lungs are clear. Right lower lobe opacity noted on recent chest radiograph earlier today is not apparent on the current study.  No pleural effusion or pneumothorax. The heart is normal in size. Visualized osseous structures are within normal limits. IMPRESSION: No evidence of acute cardiopulmonary disease. Right lower lobe opacity noted on chest radiograph earlier today is not evident on the current study. Electronically Signed   By: Julian Hy M.D.   On: 07/29/2015 11:48   Dg Chest Port 1 View  07/29/2015  CLINICAL DATA:  New onset since last night of nausea vomiting and possible fever EXAM: PORTABLE CHEST 1 VIEW COMPARISON:  Portable chest x-ray of June 18, 2015 FINDINGS: There is subtle increased density with a nodular configuration in the right infrahilar region. The left lung is clear. There is no pleural effusion. The heart and pulmonary vascularity are normal. The bony structures are unremarkable. IMPRESSION: Subtle increased density in the right infrahilar region may reflect early pneumonia. A mass is less likely given the normal appearance of this region approximately 5 weeks ago. Followup PA and lateral chest X-ray is recommended in 3-4 weeks following trial of antibiotic therapy to ensure resolution and exclude underlying malignancy. Electronically Signed   By: David  Martinique M.D.   On: 07/29/2015  07:55   Dg Abd Portable 2v  07/29/2015  CLINICAL DATA:  Acute abdominal pain with nausea and vomiting EXAM: PORTABLE ABDOMEN - 2 VIEW COMPARISON:  06/16/2015 FINDINGS: Clear lung bases. Relative paucity of bowel gas. No significant obstruction pattern or ileus. No free air evident. Prior cholecystectomy noted. No acute osseous finding. IMPRESSION: No acute finding by plain radiography. Electronically Signed   By: Jerilynn Mages.  Shick M.D.   On: 07/29/2015 07:53     CBC  Recent Labs Lab 07/29/15 0639 07/30/15 0532 07/31/15 0250  WBC 11.0* 13.4* 6.5  HGB 9.4* 8.6* 7.6*  HCT 30.8* 29.0* 25.1*  PLT 479* 501* 369  MCV 80.0 81.5 80.2  MCH 24.4* 24.2* 24.3*  MCHC 30.5 29.7* 30.3  RDW 16.0* 16.4* 16.5*  LYMPHSABS 0.9  --  1.9  MONOABS 0.7  --  0.6  EOSABS 0.0  --  0.2  BASOSABS 0.0  --  0.0    Chemistries   Recent Labs Lab 07/29/15 0639 07/29/15 1437 07/30/15 0532 07/30/15 2105 07/31/15 0251  NA 140 142 137 140 137  K 3.9 3.9 4.0 3.6 3.6  CL 101 108 106 112* 109  CO2 24 21* 13* 20* 20*  GLUCOSE 438* 275* 393* 103* 213*  BUN 15 10 11 13 14   CREATININE 0.94 0.70 1.06* 0.92 1.09*  CALCIUM 10.0 9.3 9.3 9.2 8.9  MG 2.0  --   --   --   --   AST 41  --  27  --   --   ALT 32  --  29  --   --   ALKPHOS 100  --  97  --   --   BILITOT 1.1  --  1.7*  --   --    ------------------------------------------------------------------------------------------------------------------ estimated creatinine clearance is 49.9 mL/min (by C-G formula based on Cr of 1.09). ------------------------------------------------------------------------------------------------------------------  Recent Labs  07/29/15 0639  HGBA1C 8.7*   ------------------------------------------------------------------------------------------------------------------ No results for input(s): CHOL, HDL, LDLCALC, TRIG, CHOLHDL, LDLDIRECT in the last 72  hours. ------------------------------------------------------------------------------------------------------------------ No results for input(s): TSH, T4TOTAL, T3FREE, THYROIDAB in the last 72 hours.  Invalid input(s): FREET3 ------------------------------------------------------------------------------------------------------------------ No results for input(s): VITAMINB12, FOLATE, FERRITIN, TIBC, IRON, RETICCTPCT in the last 72 hours.  Coagulation profile No results for input(s): INR, PROTIME in the last 168 hours.  No results for input(s): DDIMER in the  last 72 hours.  Cardiac Enzymes No results for input(s): CKMB, TROPONINI, MYOGLOBIN in the last 168 hours.  Invalid input(s): CK ------------------------------------------------------------------------------------------------------------------ Invalid input(s): POCBNP   CBG:  Recent Labs Lab 07/31/15 0317 07/31/15 0427 07/31/15 0536 07/31/15 0643 07/31/15 0749  GLUCAP 192* 163* 122* 104* 115*       Studies: Dg Chest Port 1 View  07/29/2015  CLINICAL DATA:  Nausea/ vomiting, UTI EXAM: PORTABLE CHEST 1 VIEW COMPARISON:  07/29/2015 at 0705 hours FINDINGS: Lungs are clear. Right lower lobe opacity noted on recent chest radiograph earlier today is not apparent on the current study. No pleural effusion or pneumothorax. The heart is normal in size. Visualized osseous structures are within normal limits. IMPRESSION: No evidence of acute cardiopulmonary disease. Right lower lobe opacity noted on chest radiograph earlier today is not evident on the current study. Electronically Signed   By: Julian Hy M.D.   On: 07/29/2015 11:48      Lab Results  Component Value Date   HGBA1C 8.7* 07/29/2015   HGBA1C 8.0 07/19/2015   HGBA1C 7.3* 05/18/2015   Lab Results  Component Value Date   LDLCALC 116* 04/04/2012   CREATININE 1.09* 07/31/2015       Scheduled Meds: . antiseptic oral rinse  7 mL Mouth Rinse q12n4p  .  atorvastatin  20 mg Oral q1800  . busPIRone  5 mg Oral TID  . cefTRIAXone (ROCEPHIN)  IV  1 g Intravenous Q24H  . chlorhexidine  15 mL Mouth Rinse BID  . dronabinol  5 mg Oral BID  . enoxaparin (LOVENOX) injection  40 mg Subcutaneous Q24H  . insulin aspart  0-9 Units Subcutaneous 6 times per day  . insulin detemir  25 Units Subcutaneous Daily  . metoCLOPramide (REGLAN) injection  10 mg Intravenous 3 times per day  . pantoprazole  40 mg Oral Daily  . pregabalin  150 mg Oral BID  . sodium chloride  1,000 mL Intravenous Once  . sodium chloride  250 mL Intravenous Once  . sodium chloride flush  3 mL Intravenous Q12H   Continuous Infusions: . sodium chloride 100 mL/hr at 07/31/15 [id number]    Active Problems:   Nausea & vomiting   Diabetic neuropathy, type I diabetes mellitus ([place])   Anemia of chronic kidney failure   Anxiety and depression   Diabetic gastroparesis (HCC)   Lactic acidosis   Acute pyelonephritis   Hypothermia    Time spent: 45 minutes   [facility] Pager [phone]. If 7PM-7AM, please contact night-coverage at [email or web address], password [facility] 07/31/2015, 10:05 AM  LOS: 2 days

## 2015-07-31 NOTE — Clinical Social Work Note (Signed)
CSW received consult for "financial concerns".  CSW met with patient who reports her insurance is showing terminated as of September 1.  Patient states she has contacted [employer] and they state that the [employer] should never have been terminated and is working to correct this situation.  Patient has taken care of the financial concern with making appropriate phone calls to her insurance company.  Patient will follow-up with insurance agent, Glendell Docker on Monday.  CSW signing off.  Nonnie Done, LCSW [employer] [facility] [phone]

## 2015-08-01 LAB — GLUCOSE, CAPILLARY
GLUCOSE-CAPILLARY: 134 mg/dL — AB (ref 65–99)
GLUCOSE-CAPILLARY: 136 mg/dL — AB (ref 65–99)
GLUCOSE-CAPILLARY: 215 mg/dL — AB (ref 65–99)
GLUCOSE-CAPILLARY: 81 mg/dL (ref 65–99)
GLUCOSE-CAPILLARY: 91 mg/dL (ref 65–99)
Glucose-Capillary: 60 mg/dL — ABNORMAL LOW (ref 65–99)
Glucose-Capillary: 61 mg/dL — ABNORMAL LOW (ref 65–99)

## 2015-08-01 LAB — CBC
HEMATOCRIT: 25.2 % — AB (ref 36.0–46.0)
HEMOGLOBIN: 7.5 g/dL — AB (ref 12.0–15.0)
MCH: 24.7 pg — AB (ref 26.0–34.0)
MCHC: 29.8 g/dL — ABNORMAL LOW (ref 30.0–36.0)
MCV: 82.9 fL (ref 78.0–100.0)
Platelets: 321 10*3/uL (ref 150–400)
RBC: 3.04 MIL/uL — AB (ref 3.87–5.11)
RDW: 16.5 % — ABNORMAL HIGH (ref 11.5–15.5)
WBC: 5 10*3/uL (ref 4.0–10.5)

## 2015-08-01 LAB — COMPREHENSIVE METABOLIC PANEL
ALT: 23 U/L (ref 14–54)
ANION GAP: 5 (ref 5–15)
AST: 27 U/L (ref 15–41)
Albumin: 2.7 g/dL — ABNORMAL LOW (ref 3.5–5.0)
Alkaline Phosphatase: 71 U/L (ref 38–126)
BUN: 13 mg/dL (ref 6–20)
CHLORIDE: 115 mmol/L — AB (ref 101–111)
CO2: 20 mmol/L — AB (ref 22–32)
CREATININE: 0.97 mg/dL (ref 0.44–1.00)
Calcium: 8.6 mg/dL — ABNORMAL LOW (ref 8.9–10.3)
GFR calc non Af Amer: >60 mL/min (ref >60)
Glucose, Bld: 152 mg/dL — ABNORMAL HIGH (ref 65–99)
POTASSIUM: 4.2 mmol/L (ref 3.5–5.1)
SODIUM: 140 mmol/L (ref 135–145)
Total Bilirubin: 0.5 mg/dL (ref 0.3–1.2)
Total Protein: 5.7 g/dL — ABNORMAL LOW (ref 6.5–8.1)

## 2015-08-01 MED ORDER — INSULIN DETEMIR 100 UNIT/ML ~~LOC~~ SOLN: 20.0000 [IU] | Freq: Every day | SUBCUTANEOUS | Status: DC

## 2015-08-01 MED ORDER — CYANOCOBALAMIN 1000 MCG/ML IJ SOLN: 1000.0000 ug | Freq: Every day | INTRAMUSCULAR | Status: DC

## 2015-08-01 MED ORDER — INSULIN DETEMIR 100 UNIT/ML ~~LOC~~ SOLN
20.0000 [IU] | Freq: Every day | SUBCUTANEOUS | Status: DC
  Administered 2015-08-01: 20 [IU] via SUBCUTANEOUS
  Filled 2015-08-01: qty 0.2

## 2015-08-01 NOTE — Discharge Summary (Addendum)
Physician Discharge Summary  [name] MRN: [id number] DOB/AGE: [birth year] 50 y.o.  PCP: [name], MD   Admit date: 07/29/2015 Discharge date: 08/01/2015  Discharge Diagnoses:     Active Problems:   Nausea & vomiting   Diabetic neuropathy, type I diabetes mellitus (HCC)   Anemia of chronic kidney failure   Anxiety and depression   Diabetic gastroparesis (HCC)   Lactic acidosis   Acute pyelonephritis   Hypothermia    Follow-up recommendations Follow-up with PCP in 3-5 days , including all  additional recommended appointments as below Follow-up CBC, CMP in 3-5 days Patient would benefit from outpatient endocrinology, she may be a candidate for insulin pump Patient needs to continue with intramuscular vitamin B-12 injections, she will need a repeat vitamin B-12 level checked in 2-3 months      Medication List    STOP taking these medications        furosemide 40 MG tablet  Commonly known as:  LASIX     levofloxacin 750 MG tablet  Commonly known as:  LEVAQUIN     lisinopril 5 MG tablet  Commonly known as:  PRINIVIL,ZESTRIL      TAKE these medications        atorvastatin 20 MG tablet  Commonly known as:  LIPITOR  Take 1 tablet (20 mg total) by mouth daily at 6 PM.     busPIRone 5 MG tablet  Commonly known as:  BUSPAR  Take 1 tablet (5 mg total) by mouth 3 (three) times daily.     cephALEXin 500 MG capsule  Commonly known as:  KEFLEX  Take 500 mg by mouth 2 (two) times daily. Reported on 07/29/2015     dronabinol 5 MG capsule  Commonly known as:  MARINOL  Take 5 mg by mouth 2 (two) times daily.     FLUoxetine 40 MG capsule  Commonly known as:  PROZAC  Take 40 mg by mouth daily.     glucose blood test strip  Commonly known as:  TRUE METRIX BLOOD GLUCOSE TEST  Use 3 times daily before meals     insulin aspart 100 UNIT/ML injection  Commonly known as:  novoLOG  Inject 0-15 Units into the skin 3 (three) times daily with meals. 2-15 units three  times daily with food. Per sliding scale     insulin detemir 100 UNIT/ML injection  Commonly known as:  LEVEMIR  Inject 0.2 mLs (20 Units total) into the skin daily.     metoCLOPramide 10 MG tablet  Commonly known as:  REGLAN  Take 1 tablet (10 mg total) by mouth every 6 (six) hours as needed for nausea.     ondansetron 4 MG disintegrating tablet  Commonly known as:  ZOFRAN ODT  Take 1 tablet (4 mg total) by mouth every 8 (eight) hours as needed for nausea.     pantoprazole 40 MG tablet  Commonly known as:  PROTONIX  Take 40 mg by mouth daily. Reported on 07/19/2015     polyethylene glycol powder powder  Commonly known as:  GLYCOLAX/MIRALAX  Take 17 g by mouth daily as needed for moderate constipation. Reported on 07/19/2015     pregabalin 150 MG capsule  Commonly known as:  LYRICA  Take 1 capsule (150 mg total) by mouth 2 (two) times daily.     TRUE METRIX METER Devi  1 each by Does not apply route 3 (three) times daily before meals.     TRUEPLUS LANCETS 28G Misc  1 each  by Does not apply route 3 (three) times daily before meals.         Discharge Condition: Stable Discharge Instructions         Disposition: 01-Home or Self Care   Consults:      Significant Diagnostic Studies:  Dg Chest Port 1 View  07/29/2015  CLINICAL DATA:  Nausea/ vomiting, UTI EXAM: PORTABLE CHEST 1 VIEW COMPARISON:  07/29/2015 at 0705 hours FINDINGS: Lungs are clear. Right lower lobe opacity noted on recent chest radiograph earlier today is not apparent on the current study. No pleural effusion or pneumothorax. The heart is normal in size. Visualized osseous structures are within normal limits. IMPRESSION: No evidence of acute cardiopulmonary disease. Right lower lobe opacity noted on chest radiograph earlier today is not evident on the current study. Electronically Signed   By: Julian Hy M.D.   On: 07/29/2015 11:48   Dg Chest Port 1 View  07/29/2015  CLINICAL DATA:  New onset  since last night of nausea vomiting and possible fever EXAM: PORTABLE CHEST 1 VIEW COMPARISON:  Portable chest x-ray of June 18, 2015 FINDINGS: There is subtle increased density with a nodular configuration in the right infrahilar region. The left lung is clear. There is no pleural effusion. The heart and pulmonary vascularity are normal. The bony structures are unremarkable. IMPRESSION: Subtle increased density in the right infrahilar region may reflect early pneumonia. A mass is less likely given the normal appearance of this region approximately 5 weeks ago. Followup PA and lateral chest X-ray is recommended in 3-4 weeks following trial of antibiotic therapy to ensure resolution and exclude underlying malignancy. Electronically Signed   By: David  Martinique M.D.   On: 07/29/2015 07:55   Dg Abd Portable 2v  07/29/2015  CLINICAL DATA:  Acute abdominal pain with nausea and vomiting EXAM: PORTABLE ABDOMEN - 2 VIEW COMPARISON:  06/16/2015 FINDINGS: Clear lung bases. Relative paucity of bowel gas. No significant obstruction pattern or ileus. No free air evident. Prior cholecystectomy noted. No acute osseous finding. IMPRESSION: No acute finding by plain radiography. Electronically Signed   By: Jerilynn Mages.  Shick M.D.   On: 07/29/2015 07:53        Filed Weights   07/29/15 0602 07/29/15 0945  Weight: 50.803 kg (112 lb) 50.6 kg (111 lb 8.8 oz)     Microbiology: Recent Results (from the past 240 hour(s))  Blood Culture (routine x 2)     Status: None (Preliminary result)   Collection Time: 07/29/15  6:44 AM  Result Value Ref Range Status   Specimen Description BLOOD NECK  Final   Special Requests BOTTLES DRAWN AEROBIC AND ANAEROBIC 5ML  Final   Culture   Final    NO GROWTH 2 DAYS Performed at [facility]    Report Status PENDING  Incomplete  Urine culture     Status: None   Collection Time: 07/29/15  7:01 AM  Result Value Ref Range Status   Specimen Description URINE, CLEAN CATCH  Final    Special Requests NONE  Final   Culture   Final    NO GROWTH 1 DAY Performed at [facility]    Report Status 07/30/2015 FINAL  Final  Blood Culture (routine x 2)     Status: None (Preliminary result)   Collection Time: 07/29/15  7:45 AM  Result Value Ref Range Status   Specimen Description BLOOD RIGHT WRIST  Final   Special Requests IN PEDIATRIC BOTTLE [facility]  Final   Culture  Final    NO GROWTH 2 DAYS Performed at [facility]    Report Status PENDING  Incomplete       Blood Culture    Component Value Date/Time   SDES BLOOD RIGHT WRIST 07/29/2015 0745   SPECREQUEST IN PEDIATRIC BOTTLE 3CC 07/29/2015 0745   CULT  07/29/2015 0745    NO GROWTH 2 DAYS Performed at [place] PENDING 07/29/2015 0745      Labs: Results for orders placed or performed during the hospital encounter of 07/29/15 (from the past 48 hour(s))  Glucose, capillary     Status: Abnormal   Collection Time: 07/30/15 11:04 AM  Result Value Ref Range   Glucose-Capillary 369 (H) 65 - 99 mg/dL  Glucose, capillary     Status: Abnormal   Collection Time: 07/30/15 12:13 PM  Result Value Ref Range   Glucose-Capillary 294 (H) 65 - 99 mg/dL  Blood gas, arterial     Status: Abnormal   Collection Time: 07/30/15 12:35 PM  Result Value Ref Range   FIO2 0.21    pH, Arterial 7.300 (L) 7.350 - 7.450   pCO2 arterial 31.9 (L) 35.0 - 45.0 mmHg   pO2, Arterial 102 (H) 80.0 - 100.0 mmHg   Bicarbonate 15.2 (L) 20.0 - 24.0 mEq/L   TCO2 14.8 0 - 100 mmol/L   Acid-base deficit 9.9 (H) 0.0 - 2.0 mmol/L   O2 Saturation 96.5 %   Patient temperature 98.6    Collection site RIGHT RADIAL    Drawn by [id number]    Sample type ARTERIAL DRAW    Allens test (pass/fail) PASS PASS  Glucose, capillary     Status: Abnormal   Collection Time: 07/30/15  1:39 PM  Result Value Ref Range   Glucose-Capillary 230 (H) 65 - 99 mg/dL  Glucose, capillary     Status: Abnormal   Collection Time: 07/30/15  2:43  PM  Result Value Ref Range   Glucose-Capillary 181 (H) 65 - 99 mg/dL  Glucose, capillary     Status: Abnormal   Collection Time: 07/30/15  3:55 PM  Result Value Ref Range   Glucose-Capillary 122 (H) 65 - 99 mg/dL  Glucose, capillary     Status: None   Collection Time: 07/30/15  5:10 PM  Result Value Ref Range   Glucose-Capillary 91 65 - 99 mg/dL  Glucose, capillary     Status: Abnormal   Collection Time: 07/30/15  6:14 PM  Result Value Ref Range   Glucose-Capillary 183 (H) 65 - 99 mg/dL  Glucose, capillary     Status: Abnormal   Collection Time: 07/30/15  7:34 PM  Result Value Ref Range   Glucose-Capillary 119 (H) 65 - 99 mg/dL  Glucose, capillary     Status: Abnormal   Collection Time: 07/30/15  8:39 PM  Result Value Ref Range   Glucose-Capillary 110 (H) 65 - 99 mg/dL  Basic metabolic panel     Status: Abnormal   Collection Time: 07/30/15  9:05 PM  Result Value Ref Range   Sodium 140 135 - 145 mmol/L   Potassium 3.6 3.5 - 5.1 mmol/L   Chloride 112 (H) 101 - 111 mmol/L   CO2 20 (L) 22 - 32 mmol/L   Glucose, Bld 103 (H) 65 - 99 mg/dL   BUN 13 6 - 20 mg/dL   Creatinine, Ser 0.92 0.44 - 1.00 mg/dL   Calcium 9.2 8.9 - 10.3 mg/dL   GFR calc non Af Amer >60 >60  mL/min   GFR calc Af Amer >60 >60 mL/min    Comment: (NOTE) The eGFR has been calculated using the CKD EPI equation. This calculation has not been validated in all clinical situations. eGFR's persistently <60 mL/min signify possible Chronic Kidney Disease.    Anion gap 8 5 - 15  Glucose, capillary     Status: None   Collection Time: 07/30/15  9:45 PM  Result Value Ref Range   Glucose-Capillary 96 65 - 99 mg/dL  Glucose, capillary     Status: Abnormal   Collection Time: 07/30/15 10:58 PM  Result Value Ref Range   Glucose-Capillary 127 (H) 65 - 99 mg/dL  Glucose, capillary     Status: Abnormal   Collection Time: 07/31/15 12:04 AM  Result Value Ref Range   Glucose-Capillary 145 (H) 65 - 99 mg/dL  Glucose,  capillary     Status: Abnormal   Collection Time: 07/31/15  1:10 AM  Result Value Ref Range   Glucose-Capillary 166 (H) 65 - 99 mg/dL  Glucose, capillary     Status: Abnormal   Collection Time: 07/31/15  2:13 AM  Result Value Ref Range   Glucose-Capillary 175 (H) 65 - 99 mg/dL  CBC with Differential/Platelet     Status: Abnormal   Collection Time: 07/31/15  2:50 AM  Result Value Ref Range   WBC 6.5 4.0 - 10.5 K/uL   RBC 3.13 (L) 3.87 - 5.11 MIL/uL   Hemoglobin 7.6 (L) 12.0 - 15.0 g/dL   HCT 25.1 (L) 36.0 - 46.0 %   MCV 80.2 78.0 - 100.0 fL   MCH 24.3 (L) 26.0 - 34.0 pg   MCHC 30.3 30.0 - 36.0 g/dL   RDW 16.5 (H) 11.5 - 15.5 %   Platelets 369 150 - 400 K/uL   Neutrophils Relative % 59 %   Neutro Abs 3.8 1.7 - 7.7 K/uL   Lymphocytes Relative 29 %   Lymphs Abs 1.9 0.7 - 4.0 K/uL   Monocytes Relative 9 %   Monocytes Absolute 0.6 0.1 - 1.0 K/uL   Eosinophils Relative 3 %   Eosinophils Absolute 0.2 0.0 - 0.7 K/uL   Basophils Relative 0 %   Basophils Absolute 0.0 0.0 - 0.1 K/uL  Basic metabolic panel     Status: Abnormal   Collection Time: 07/31/15  2:51 AM  Result Value Ref Range   Sodium 137 135 - 145 mmol/L   Potassium 3.6 3.5 - 5.1 mmol/L   Chloride 109 101 - 111 mmol/L   CO2 20 (L) 22 - 32 mmol/L   Glucose, Bld 213 (H) 65 - 99 mg/dL   BUN 14 6 - 20 mg/dL   Creatinine, Ser 1.09 (H) 0.44 - 1.00 mg/dL   Calcium 8.9 8.9 - 10.3 mg/dL   GFR calc non Af Amer 59 (L) >60 mL/min   GFR calc Af Amer >60 >60 mL/min    Comment: (NOTE) The eGFR has been calculated using the CKD EPI equation. This calculation has not been validated in all clinical situations. eGFR's persistently <60 mL/min signify possible Chronic Kidney Disease.    Anion gap 8 5 - 15  Glucose, capillary     Status: Abnormal   Collection Time: 07/31/15  3:17 AM  Result Value Ref Range   Glucose-Capillary 192 (H) 65 - 99 mg/dL  Glucose, capillary     Status: Abnormal   Collection Time: 07/31/15  4:27 AM  Result  Value Ref Range   Glucose-Capillary 163 (H) 65 - 99 mg/dL  Glucose, capillary     Status: Abnormal   Collection Time: 07/31/15  5:36 AM  Result Value Ref Range   Glucose-Capillary 122 (H) 65 - 99 mg/dL  Glucose, capillary     Status: Abnormal   Collection Time: 07/31/15  6:43 AM  Result Value Ref Range   Glucose-Capillary 104 (H) 65 - 99 mg/dL  Glucose, capillary     Status: Abnormal   Collection Time: 07/31/15  7:49 AM  Result Value Ref Range   Glucose-Capillary 115 (H) 65 - 99 mg/dL  Glucose, capillary     Status: Abnormal   Collection Time: 07/31/15 11:47 AM  Result Value Ref Range   Glucose-Capillary 246 (H) 65 - 99 mg/dL  Comprehensive metabolic panel     Status: Abnormal   Collection Time: 07/31/15  4:43 PM  Result Value Ref Range   Sodium 138 135 - 145 mmol/L   Potassium 3.8 3.5 - 5.1 mmol/L   Chloride 113 (H) 101 - 111 mmol/L   CO2 19 (L) 22 - 32 mmol/L   Glucose, Bld 82 65 - 99 mg/dL   BUN 14 6 - 20 mg/dL   Creatinine, Ser 0.90 0.44 - 1.00 mg/dL   Calcium 8.7 (L) 8.9 - 10.3 mg/dL   Total Protein 5.7 (L) 6.5 - 8.1 g/dL   Albumin 2.7 (L) 3.5 - 5.0 g/dL   AST 38 15 - 41 U/L   ALT 26 14 - 54 U/L   Alkaline Phosphatase 70 38 - 126 U/L   Total Bilirubin 0.4 0.3 - 1.2 mg/dL   GFR calc non Af Amer >60 >60 mL/min   GFR calc Af Amer >60 >60 mL/min    Comment: (NOTE) The eGFR has been calculated using the CKD EPI equation. This calculation has not been validated in all clinical situations. eGFR's persistently <60 mL/min signify possible Chronic Kidney Disease.    Anion gap 6 5 - 15  Glucose, capillary     Status: None   Collection Time: 07/31/15  5:10 PM  Result Value Ref Range   Glucose-Capillary 67 65 - 99 mg/dL  Glucose, capillary     Status: Abnormal   Collection Time: 07/31/15  5:52 PM  Result Value Ref Range   Glucose-Capillary 102 (H) 65 - 99 mg/dL  Glucose, capillary     Status: Abnormal   Collection Time: 07/31/15  9:29 PM  Result Value Ref Range    Glucose-Capillary 147 (H) 65 - 99 mg/dL  Glucose, capillary     Status: None   Collection Time: 07/31/15 11:53 PM  Result Value Ref Range   Glucose-Capillary 67 65 - 99 mg/dL  Glucose, capillary     Status: Abnormal   Collection Time: 08/01/15 12:12 AM  Result Value Ref Range   Glucose-Capillary 61 (L) 65 - 99 mg/dL  Glucose, capillary     Status: Abnormal   Collection Time: 08/01/15 12:33 AM  Result Value Ref Range   Glucose-Capillary 60 (L) 65 - 99 mg/dL  Glucose, capillary     Status: None   Collection Time: 08/01/15 12:59 AM  Result Value Ref Range   Glucose-Capillary 81 65 - 99 mg/dL  Glucose, capillary     Status: Abnormal   Collection Time: 08/01/15  4:16 AM  Result Value Ref Range   Glucose-Capillary 136 (H) 65 - 99 mg/dL  Comprehensive metabolic panel     Status: Abnormal   Collection Time: 08/01/15  6:12 AM  Result Value Ref Range   Sodium 140 135 -  145 mmol/L   Potassium 4.2 3.5 - 5.1 mmol/L   Chloride 115 (H) 101 - 111 mmol/L   CO2 20 (L) 22 - 32 mmol/L   Glucose, Bld 152 (H) 65 - 99 mg/dL   BUN 13 6 - 20 mg/dL   Creatinine, Ser 0.97 0.44 - 1.00 mg/dL   Calcium 8.6 (L) 8.9 - 10.3 mg/dL   Total Protein 5.7 (L) 6.5 - 8.1 g/dL   Albumin 2.7 (L) 3.5 - 5.0 g/dL   AST 27 15 - 41 U/L   ALT 23 14 - 54 U/L   Alkaline Phosphatase 71 38 - 126 U/L   Total Bilirubin 0.5 0.3 - 1.2 mg/dL   GFR calc non Af Amer >60 >60 mL/min   GFR calc Af Amer >60 >60 mL/min    Comment: (NOTE) The eGFR has been calculated using the CKD EPI equation. This calculation has not been validated in all clinical situations. eGFR's persistently <60 mL/min signify possible Chronic Kidney Disease.    Anion gap 5 5 - 15  CBC     Status: Abnormal   Collection Time: 08/01/15  6:12 AM  Result Value Ref Range   WBC 5.0 4.0 - 10.5 K/uL   RBC 3.04 (L) 3.87 - 5.11 MIL/uL   Hemoglobin 7.5 (L) 12.0 - 15.0 g/dL   HCT 25.2 (L) 36.0 - 46.0 %   MCV 82.9 78.0 - 100.0 fL   MCH 24.7 (L) 26.0 - 34.0 pg   MCHC  29.8 (L) 30.0 - 36.0 g/dL   RDW 16.5 (H) 11.5 - 15.5 %   Platelets 321 150 - 400 K/uL  Glucose, capillary     Status: Abnormal   Collection Time: 08/01/15  7:52 AM  Result Value Ref Range   Glucose-Capillary 134 (H) 65 - 99 mg/dL     Lipid Panel     Component Value Date/Time   CHOL 187 04/04/2012 0550   TRIG 110 04/04/2012 0550   HDL 49 04/04/2012 0550   CHOLHDL 3.8 04/04/2012 0550   VLDL 22 04/04/2012 0550   LDLCALC 116* 04/04/2012 0550     Lab Results  Component Value Date   HGBA1C 8.7* 07/29/2015   HGBA1C 8.0 07/19/2015   HGBA1C 7.3* 05/18/2015     Lab Results  Component Value Date   LDLCALC 116* 04/04/2012   CREATININE 0.97 08/01/2015     Brief summary 50 year old female with a history of type 1 diabetes, gastroparesis, intractable nausea vomiting, multiple admissions to [facility] as well as [facility] for recurrent nausea vomiting who presents to the ER for intractable nausea and vomiting that started around 9 PM last night. No diarrhea. The patient was diagnosed with a urinary tract infection on 1/23, discharged from the ED on Levaquin. Subsequently urine culture showed positive Escherichia coli which was sensitive to Rocephin, patient was subsequently called in a prescription for Keflex that she was not able to fill. She presented to the ER with severe nausea vomiting and weakness, patient found to be slightly hypothermic temperature 96.7, tachycardic with heart rate of 110, initial lactic acid 3.1, subsequently increased to 3.8, venous blood gas showed a pH of 7.336, PCO2 of 41. Repeat UA negative, urine drug screen negative, abdominal KUB negative. Initial troponin negative. Anion gap 11 on admission. Patient subsequently developed DKA requiring transition to insulin drip per 24 hours, now improving   Assessment and plan Intractable Nausea & vomiting secondary to exacerbation of her gastroparesis, 9-10 admissions in 2016 alone vs DKA  Continue Reglan,  Phenergan prn Received multiple fluid boluses in the ER, currently on a clear liquid diet, advanced to carb Modified diet KUB does not show any ileus or obstruction Symptoms could be exacerbated by viral gastroenteritis vs UTI vs DKA  Patient is supposed to follow-up at GI clinic at [facility] for gastric stimulator and sees Dr. Rudi Rummage Patient states that she would like to advance her diet and is feeling better today    DKA-resolved Today anion gap 5, bicarbonate 20 Insulin drip was discontinued 2/5 Patient has been restarted on Levemir and sliding scale insulin Hemoglobin A1c 8.7, patient may benefit from an insulin pump  Diabetic neuropathy, continue Lyrica   Anxiety and depression-hold BuSpar, Prozac in the setting of nausea   Diabetic gastroparesis (HCC)-management as above, continue Reglan   Lactic acidosis-treated aggressively withIV fluid hydration, likely secondary to dehydration   Acute pyelonephritis-started on Rocephin, most recent positive urine culture was from 1/26 that showed Escherichia coli, patient to continue with Keflex for another 3 days  Anemia-multifactorial, anemia from chronic disease, vitamin B-12 deficiency-(147 ), most recent anemia panel done on 12/28 showed iron level of 12, normal TIBC Patient was given intramuscular vitamin B-12 injections and also give 1 dose of IV Venofere , as patient will not be able to take oral iron tabs in the setting of her severe gastroparesis Patient should continue to receive vitamin B-12 injections Patient should have a repeat vitamin B-12 level in 6 months     Discharge Exam: *   Blood pressure 110/52, pulse 88, temperature 98.2 F (36.8 C), temperature source Oral, resp. rate 18, height 5' 2"  (1.575 m), weight 50.6 kg (111 lb 8.8 oz), last menstrual period 06/26/2015, SpO2 97 %.   General: No acute respiratory distress Lungs: Clear to auscultation bilaterally without wheezes or crackles Cardiovascular:  Regular rate and rhythm without murmur gallop or rub normal S1 and S2 AbdomenDiffusely tender but without rebound , nondistended, soft, bowel sounds positive, no rebound, no ascites, no appreciable mass Extremities: No significant cyanosis, clubbing, or edema bilateral lower extremities        Follow-up Information    Follow up with [name], MD. Schedule an appointment as soon as possible for a visit in 3 days.   Specialty:  Family Medicine   Contact information:   [place] [address] [phone]       Signed: Reyne Dumas 08/01/2015, 10:23 AM        Time spent >45 mins

## 2015-08-01 NOTE — Hospital Discharge Follow-Up (Signed)
The patient is known to the [facility] at the [facility]. Met with her today to discuss her plan for follow up at the clinic.  She said that she is not sure if she is going home today and would like her appointments for tomorrow with Dr Jarold Song and Vesta Mixer  to be rescheduled. She said that she will also need transportation to the clinic.  The  appointment with Dr Jarold Song was scheduled for 08/04/15 @ 1200 and an appointment was also scheduled for the same day at 67 with Vesta Mixer, MSW, The appointment information was placed on the AVS.  [place] to provide cab transportation for her to her appointments on 08/04/15.  The patient stated that she spoke to her insurance [place] regarding her insurance coverage for 2016.  She said that she is waiting for a call back and noted that if she does not hear back today, she will call the insurance company again.   She has her prescriptions filled at the [facility].   Will continue to follow her hospitalization.

## 2015-08-01 NOTE — Progress Notes (Addendum)
Nutrition Follow-up  DOCUMENTATION CODES:   Not applicable  INTERVENTION:  - Continue Carb Modified diet - RD will continue to monitor for needs  NUTRITION DIAGNOSIS:   Inadequate oral intake related to acute illness, nausea, vomiting as evidenced by per patient/family report. -improving/resolving  GOAL:   Patient will meet greater than or equal to 90% of their needs -likely met  MONITOR:   PO intake, Weight trends, Labs, I & O's  ASSESSMENT:   50 year old female with a history of type 1 diabetes, gastroparesis, intractable nausea vomiting, multiple admissions to [facility] as well as [facility] for recurrent nausea vomiting who presents to the ER for intractable nausea and vomiting that started around 9 PM last night. No diarrhea. The patient was diagnosed with a urinary tract infection on 1/23, discharged from the ED on Levaquin. Subsequently urine culture showed positive Escherichia coli which was sensitive to Rocephin, patient was subsequently called in a prescription for Keflex that she was not able to fill. She presented to the ER with severe nausea vomiting and weakness, patient found to be slightly hypothermic temperature 96.7, tachycardic with heart rate of 110, initial lactic acid 3.1, subsequently increased to 3.8, penis blood gas showed a pH of 7.336, PCO2 of 41. Repeat UA negative, urine drug screen negative, abdominal KUB negative. Initial troponin negative. Anion gap 11. Patient does have a history of diabetic ketoacidosis in the past  2/6 No intakes documented since previous assessment. Diet advanced from CLD to Carb Modified 2/5 at 0904. Pt states that last night she ate 100% of dinner and that she ate well at breakfast this AM. She reports good appetite PTA prior to onset of N/V. Last episode of emesis, per pt, was 2/3 PM. Pt denies abdominal pain or nausea with intakes this AM.   Pt asks questions related to nutrition pertaining to hx of gastroparesis. This  topic was discussed with pt and recommendation made for smaller meals throughout the day rather than 3 large meals. Pt states that she was previous encouraged by GI MD from [facility] to drink Gatorade rather than water. Pt states that she has been drinking ice water and consuming ice chips and prefers this to Gatorade. She asks RD if it is okay to stop drinking Gatorade. Encouraged her to talk to MD who recommended this to determine reason for recommendation and possibility for adjustment.   Likely meeting needs. Medications reviewed. Labs reviewed; CBGs: 60-246 mg/dL, Cl: 115 mmol/L, Ca: 8.6 mg/dL.  ADDENDUM: Physical assessment done at this time and shows not signs of muscle or fat wasting.     2/3 - Pt recently arrived to the floor from ED and has, per chart, had ongoing N/V and weakness since last night (2/2) @ 2100.  - Pt still very nauseated during time of RD visit and denies having any intakes today due to this.  - She states she last ate a sandwich yesterday afternoon.  - Unable to do physical assessment at this time or obtain other information from PTA with respect to pt's comfort/current condition.  - Per chart review, pt has lost 9 lbs (7.5% body weight) in the past 1 month which is significant for time frame; will continue to monitor weight trends.  - Unable to confirm malnutrition at this time due to inability to do physical or obtain further information.  Diet Order:  Diet Carb Modified Fluid consistency:: Thin; Room service appropriate?: Yes  Skin:  Reviewed, no issues  Last BM:  2/3  Height:   Ht Readings from Last 1 Encounters:  07/29/15 _0  (1.575 m)    Weight:   Wt Readings from Last 1 Encounters:  07/29/15 111 lb 8.8 oz (50.6 kg)    Ideal Body Weight:  50 kg (kg)  BMI:  Body mass index is 20.4 kg/(m^2).  Estimated Nutritional Needs:   Kcal:  1300-1500  Protein:  50-60 grams  Fluid:  >/= 1.8 L/day  EDUCATION NEEDS:   No education needs identified at  this time    Jarome Matin, RD, LDN Inpatient Clinical Dietitian Pager # [phone] After hours/weekend pager # [phone]

## 2015-08-01 NOTE — Progress Notes (Signed)
Inpatient Diabetes Program Recommendations  AACE/ADA: New Consensus Statement on Inpatient Glycemic Control (2015)  Target Ranges:  Prepandial:   less than 140 mg/dL      Peak postprandial:   less than 180 mg/dL (1-2 hours)      Critically ill patients:  140 - 180 mg/dL   Results for IJA, LEWY (MRN [id number]) as of 08/01/2015 09:20  Ref. Range 07/31/2015 00:04 07/31/2015 01:10 07/31/2015 02:13 07/31/2015 03:17 07/31/2015 04:27 07/31/2015 05:36 07/31/2015 06:43 07/31/2015 07:49 07/31/2015 11:47 07/31/2015 17:10 07/31/2015 17:52 07/31/2015 21:29  Glucose-Capillary Latest Ref Range: 65-99 mg/dL 145 (H) 166 (H) 175 (H) 192 (H) 163 (H) 122 (H) 104 (H) 115 (H) 246 (H) 67 102 (H) 147 (H)    Results for JAILANI, WOODLIFF (MRN [id number]) as of 08/01/2015 09:20  Ref. Range 07/31/2015 23:53 08/01/2015 00:12 08/01/2015 00:33 08/01/2015 00:59 08/01/2015 04:16 08/01/2015 07:52  Glucose-Capillary Latest Ref Range: 65-99 mg/dL 67 61 (L) 60 (L) 81 136 (H) 134 (H)    Home DM Meds: Levemir 15 units QHS       Novolog 2-15 units tid  Current Insulin Orders: Levemir 20 units daily      Novolog Sensitive SSI (0-9 units) Q4 hours      -Note patient transitioned off IV insulin drip yesterday AM (02/05).  Was given 25 units Levemir at 9am.  -Had mild Hypoglycemic event yesterday at 5pm and again at midnight.  These Hypoglycemic events may have been due to the Levemir dose that was given earlier that morning.  Patient normally only takes Levemir 15 units QHS at home.   MD- Recommend we see how patient's CBGs fare today on Levemir 20 units daily + Novolog Sensitive SSI Q4 hours.  Patient not eating well yet.  Will follow closely.     --Will follow patient during hospitalization--  Wyn Quaker RN, MSN, CDE Diabetes Coordinator Inpatient Glycemic Control Team Team Pager: [phone] (8a-5p)

## 2015-08-02 ENCOUNTER — Telehealth: Payer: Self-pay

## 2015-08-02 ENCOUNTER — Other Ambulatory Visit: Payer: Self-pay

## 2015-08-02 ENCOUNTER — Ambulatory Visit: Payer: Self-pay | Admitting: Family Medicine

## 2015-08-02 NOTE — Telephone Encounter (Signed)
[facility] Post-discharge Follow-Up Phone Call:  Attempt #1  Date of Discharge: 08/01/2015 Principal Discharge Diagnosis(es):intractable nausea/vomiting, DM, anemai of chronic kidney failure. Post-discharge Communication: Call placed to the patient # [phone] and a HIPAA compliant voice mail message was left requesting a call back to # [phone] or [phone].  Call Completed: No

## 2015-08-03 ENCOUNTER — Telehealth: Payer: Self-pay

## 2015-08-03 LAB — CULTURE, BLOOD (ROUTINE X 2)
CULTURE: NO GROWTH
Culture: NO GROWTH

## 2015-08-03 NOTE — Telephone Encounter (Signed)
Transitional Care Clinic Post-discharge Follow-Up Phone Call:  Date of Discharge: 08/01/2015 Principal Discharge Diagnosis(es): gastroparesis, DKA, diabetic neuropathy, anemia Post-discharge Communication: (Clearly document all attempts clearly and date contact made) Called the patient.  She stated that she was " not feeling too well."  She said that she has been nauseous. She then stated that she cancelled her appointment for tomorrow and has rescheduled to 08/16/15 for both Dr Jarold Song and Vesta Mixer, SW.   She said that she is just not feeling up to coming to the clinic. She noted that she just got home from the hospital and has not started checking her blood sugars yet, so she does not have a log to bring to the clinic.  Discussed the importance of medical follow up especially if she is not feeling well, and possibly it would be best to keep her appointment for tomorrow.  She said that she was just tired and did not want to come to the clinic tomorrow.   Call Completed: Yes                   With Whom: Patient Interpreter Needed: No     Please check all that apply:  X  Patient is knowledgeable of his/her condition(s) and/or treatment. X  Patient is caring for self at home.  ? Patient is receiving assist at home from family and/or caregiver. Family and/or caregiver is knowledgeable of patient's condition(s) and/or treatment. ? Patient is receiving home health services. If so, name of agency.     Medication Reconciliation:  ? Medication list reviewed with patient. X  Patient obtained all discharge medications. If not, why? She stated that she has all of her medications and did not have any new medications ordered and did not need to review her medication list.  She said the she is not going to stop the lasix because her feet and legs will swell and she will have difficulty walking. She noted that she told this to Dr Jarold Song at her last visit and she continues to take the lasix that she had prior to  seeing Dr Jarold Song. This CM also instructed her to stop the levofloxin and lisinopril as noted on the discharge summary. She did not respond. In basket message sent to Dr Jarold Song with an update.    Activities of Daily Living:  X  Independent ? Needs assist (describe; ? home DME used) ? [facility] (describe, ? home DME used)   Community resources in place for patient:  X  None  - She said that she has no help at home except for her son.  ? Home Health/Home DME ? Assisted Living ? Support Group          Patient Education: Attempted to discuss her insurance status and she requested that we " talk later" because she was not feeling well.          Questions/Concerns discussed: No other questions /concerns reported as she ended the conversation stating that she was just tired and would talk at another time. No opportunity for any additional discussion.

## 2015-08-03 NOTE — Telephone Encounter (Signed)
Transitional [facility] Clinic Post-discharge Follow-Up Phone Call:  Attempted # 2  - to check on the patient's status and to remind her of her appointments tomorrow, 08/04/15 @ 1100 with Vesta Mixer, SW and @ 1200 with Dr Jarold Song. Also to  Inquire if the patient needs transportation to her appointment   Date of Discharge: 08/01/2015 Principal Discharge Diagnosis(es):Intractable nausea /vomiting; DM, Anemia of chronic kidney disease Post-discharge Communication:  Call placed to # [phone] (M) and a HIPAA compliant voice mail message was left requesting a call back to # [phone] or [phone].  Call Completed: No

## 2015-08-04 ENCOUNTER — Ambulatory Visit: Payer: Self-pay | Admitting: Family Medicine

## 2015-08-04 ENCOUNTER — Ambulatory Visit: Payer: Self-pay | Admitting: Clinical

## 2015-08-07 IMAGING — CT CT ABD-PELV W/ CM
2 of 5 series · 17 of 46 positions shown, 19 images · IV contrast (omnipaque)
Comparison: Radiographs dated 10/21/2014 and CT scans dated
09/23/2014, 03/15/2014, 11/25/2012 and 03/18/2010

CLINICAL DATA: Nausea and vomiting.

EXAM:
CT ABDOMEN AND PELVIS WITH CONTRAST
TECHNIQUE: Multidetector CT imaging of the abdomen and pelvis was performed
using the standard protocol following bolus administration of
intravenous contrast.
CONTRAST:  100mL OMNIPAQUE IOHEXOL 300 MG/ML  SOLN

[Series 2: rtn ap with st · axial · 0.59mm/px · z∈[-468,-88]mm · 14 of 86 slices shown, 16 images]
[im 5/86  soft-tissue]
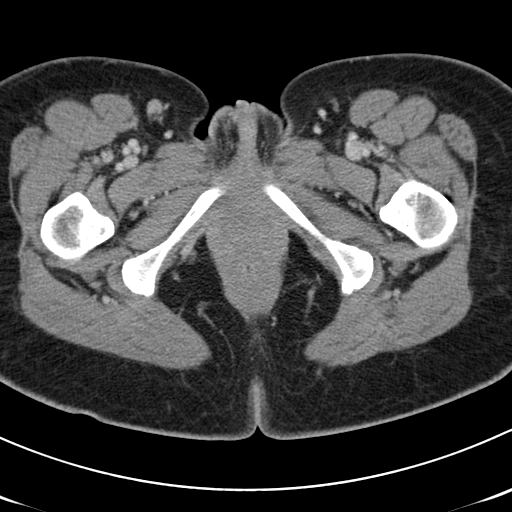
[im 5/86  bone]
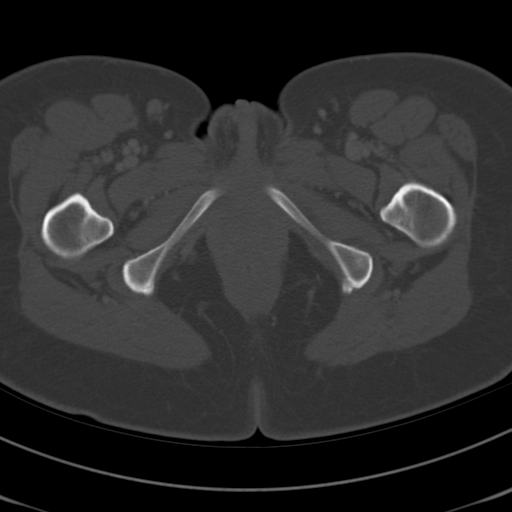
[im 9/86  soft-tissue]
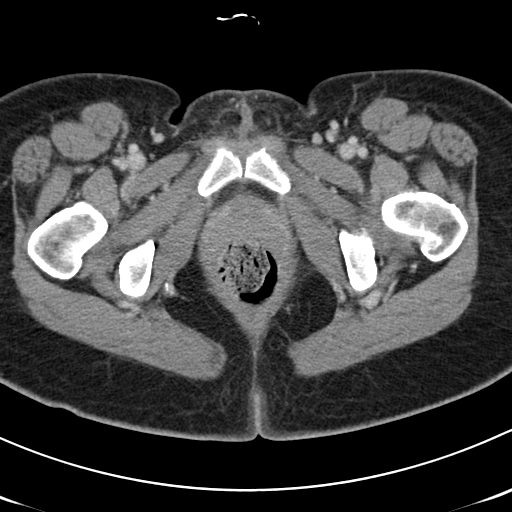
[im 18/86  soft-tissue]
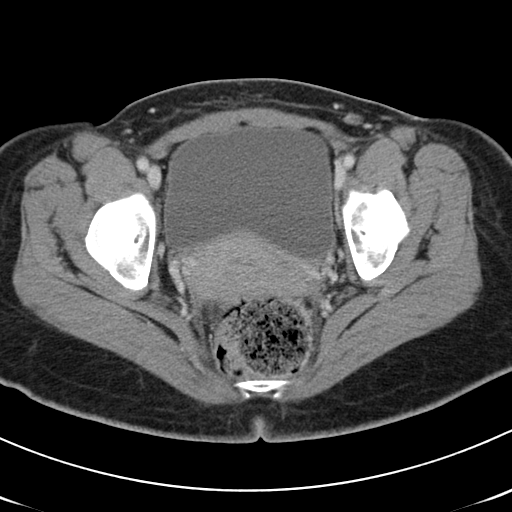
[im 23/86  soft-tissue]
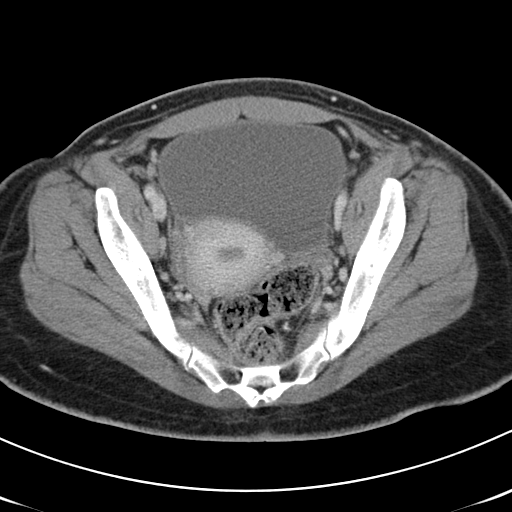
[im 27/86  soft-tissue]
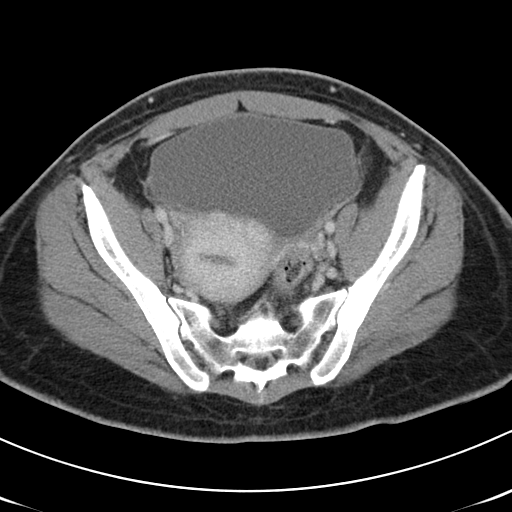
[im 36/86  soft-tissue]
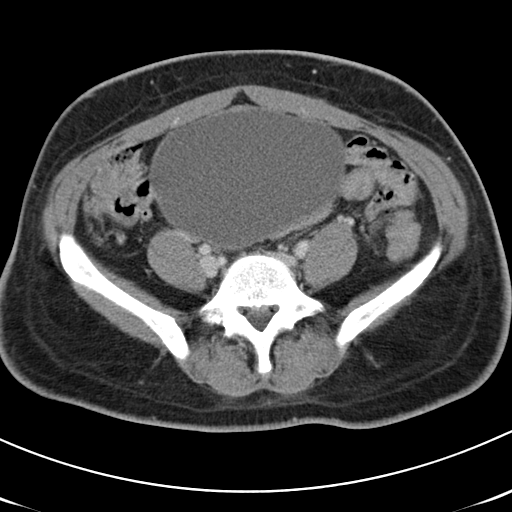
[im 41/86  soft-tissue]
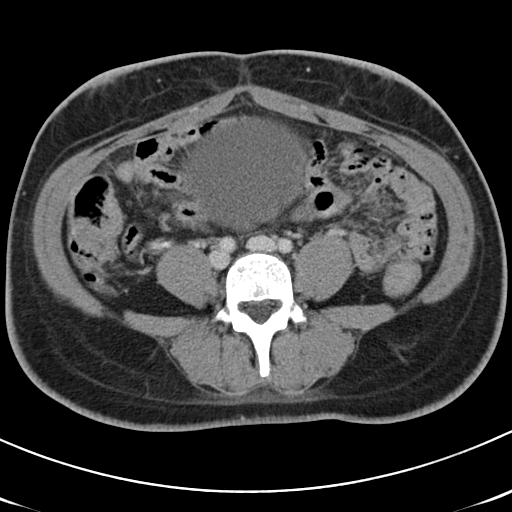
[im 45/86  soft-tissue]
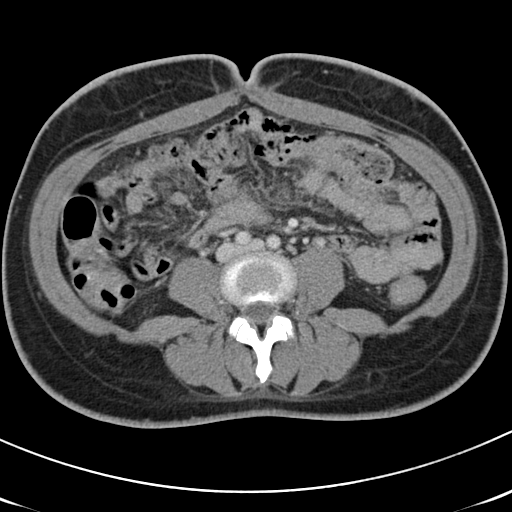
[im 50/86  soft-tissue]
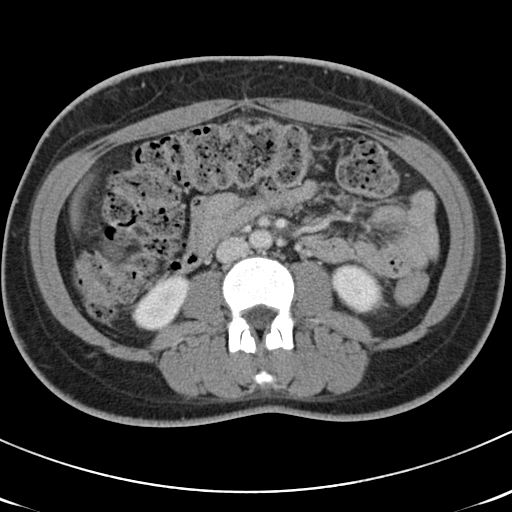
[im 50/86  bone]
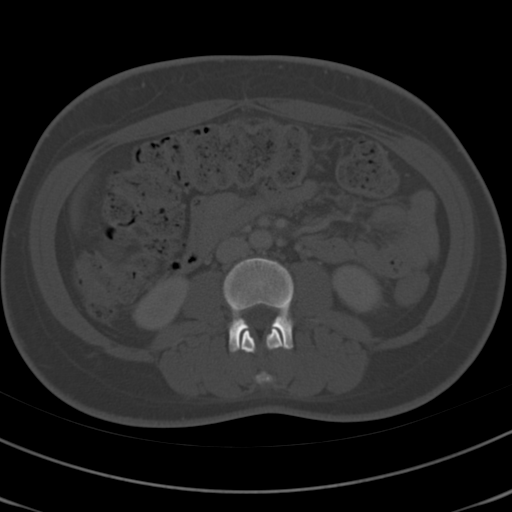
[im 59/86  soft-tissue]
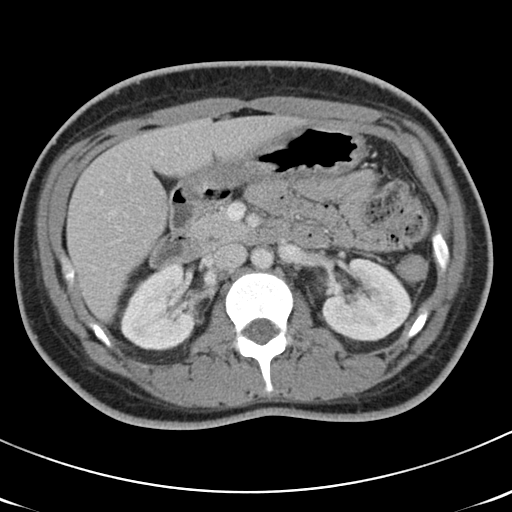
[im 63/86  soft-tissue]
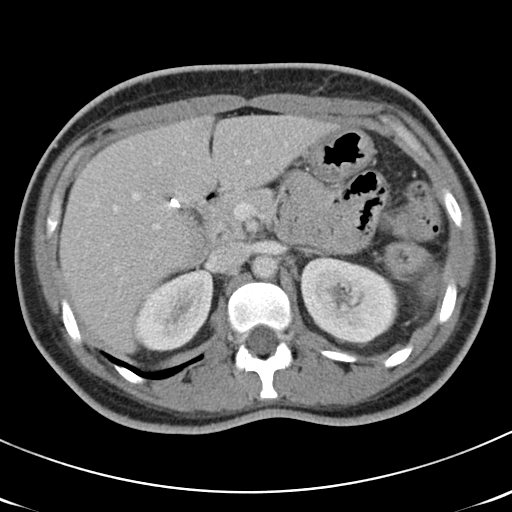
[im 68/86  soft-tissue]
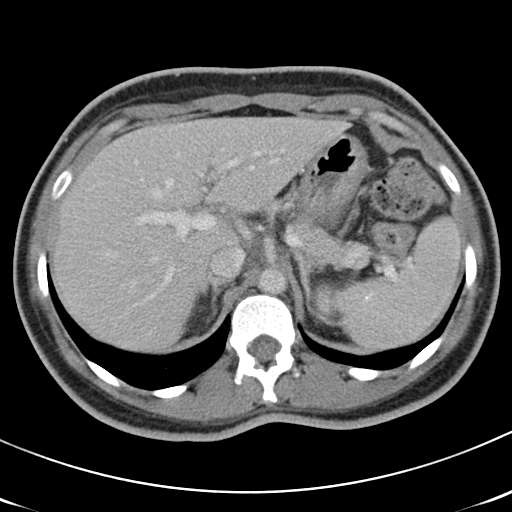
[im 77/86  soft-tissue]
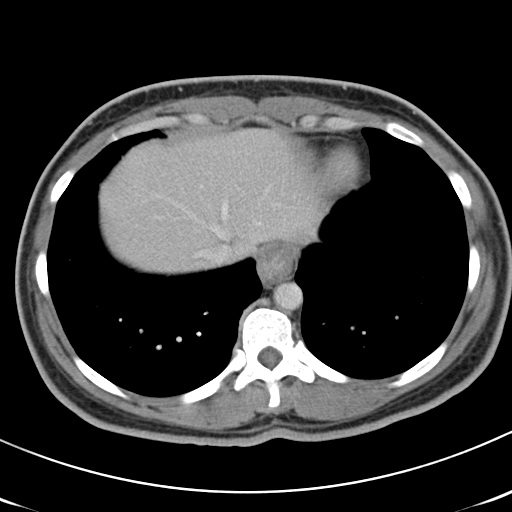
[im 81/86  soft-tissue]
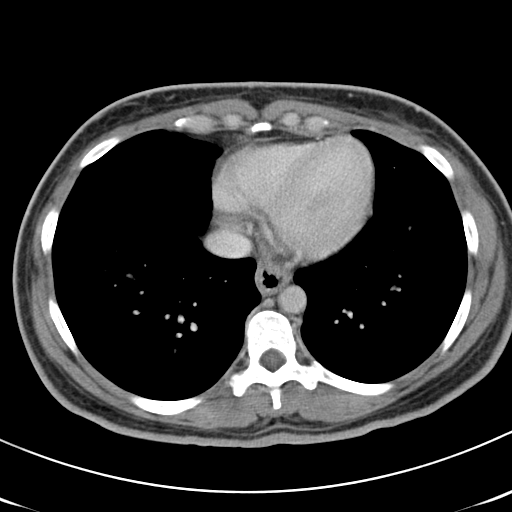

[Series 602: coronal images · coronal · 0.87mm/px · 3 of 75 slices shown]
[im 25/75  soft-tissue]
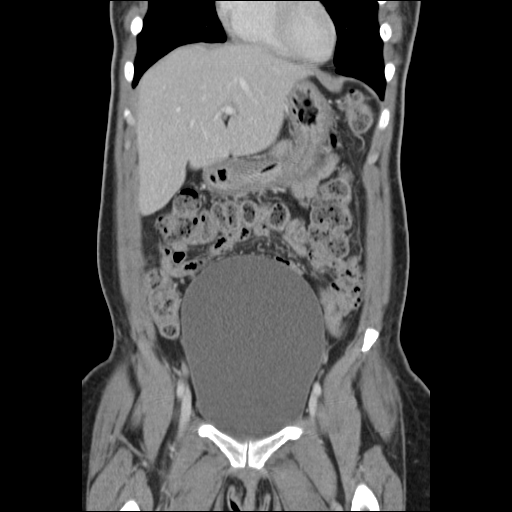
[im 33/75  soft-tissue]
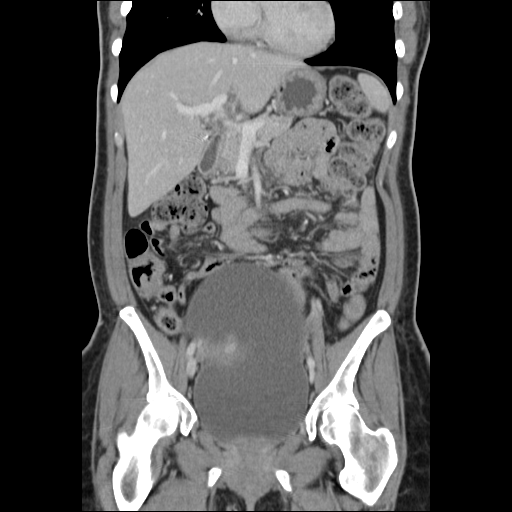
[im 42/75  soft-tissue]
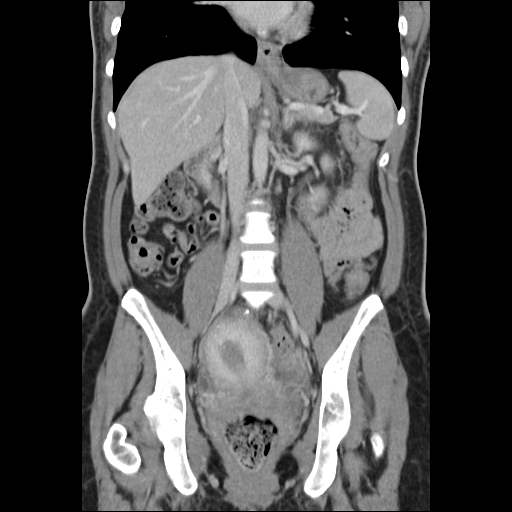

[17 of 46 positions shown; findings below may reference images not displayed]

FINDINGS: There is chronic marked distention of the bladder. The bladder is
distended to the level of the umbilicus.

Gallbladder has been removed. Liver, spleen, pancreas, adrenal
glands, and kidneys are normal. The bowel is normal including the
terminal ileum and appendix. There is moderate stool in the
ascending and transverse portions of the colon but there is very
little stool in the descending colon. There is a large amount of
stool in the rectum. The sigmoid portion of the colon is compressed
between the left sole us muscle and the distended urinary bladder.

There is a partially calcified exophytic fibroid on the left side of
the fundus of the uterus. There other smaller fibroids in the
uterus.

No free air or free fluid. No adenopathy. Minimal atherosclerotic
disease of the proximal abdominal aorta.
IMPRESSION: 1. Chronically distended urinary bladder to the level of the
umbilicus.
2. Uterine fibroids, unchanged since the most recent CT scan.

## 2015-08-07 IMAGING — DX DG ABDOMEN 1V
1 series · 2 of 2 positions shown · non-contrast
Comparison: CT scan of the abdomen dated 09/23/2014 and radiographs
dated 08/30/2014

CLINICAL DATA: Nausea and vomiting.

EXAM:
ABDOMEN - 1 VIEW

[Series 1: abdomen kub · 0.14mm/px · 2 of 2 slices shown]
[im 1/2]
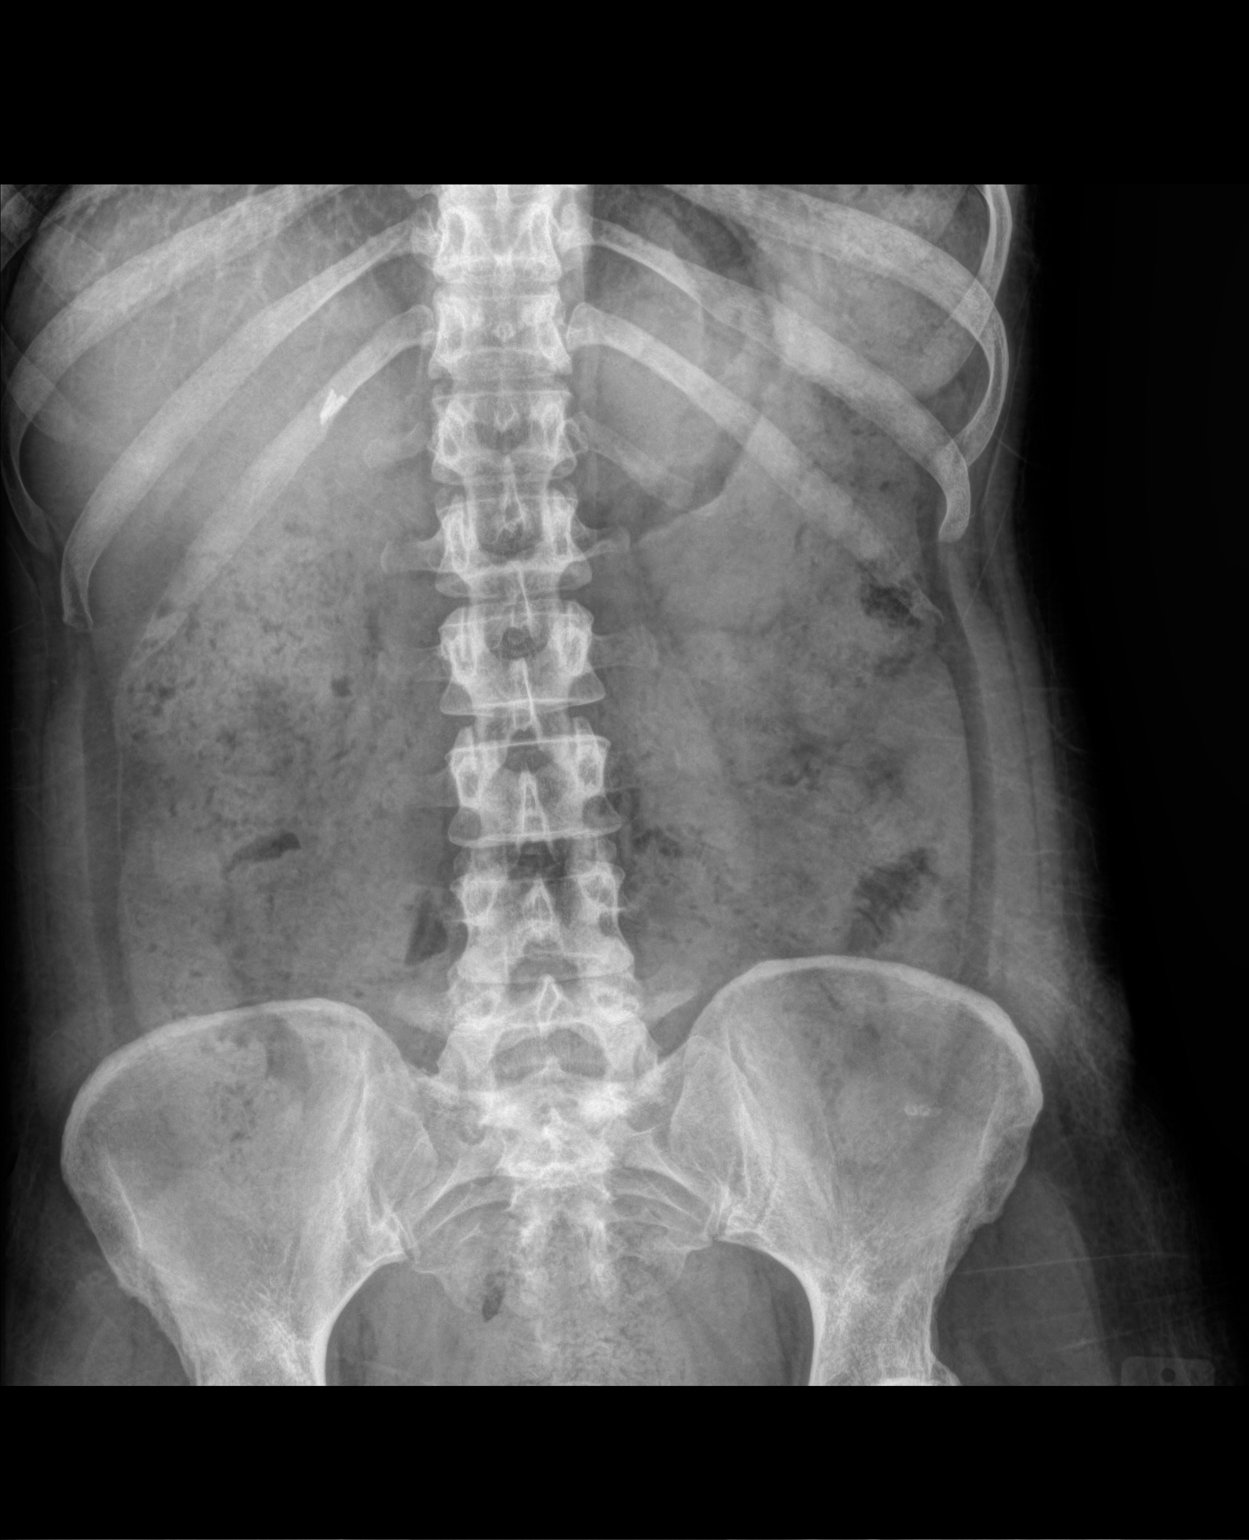
[im 2/2]
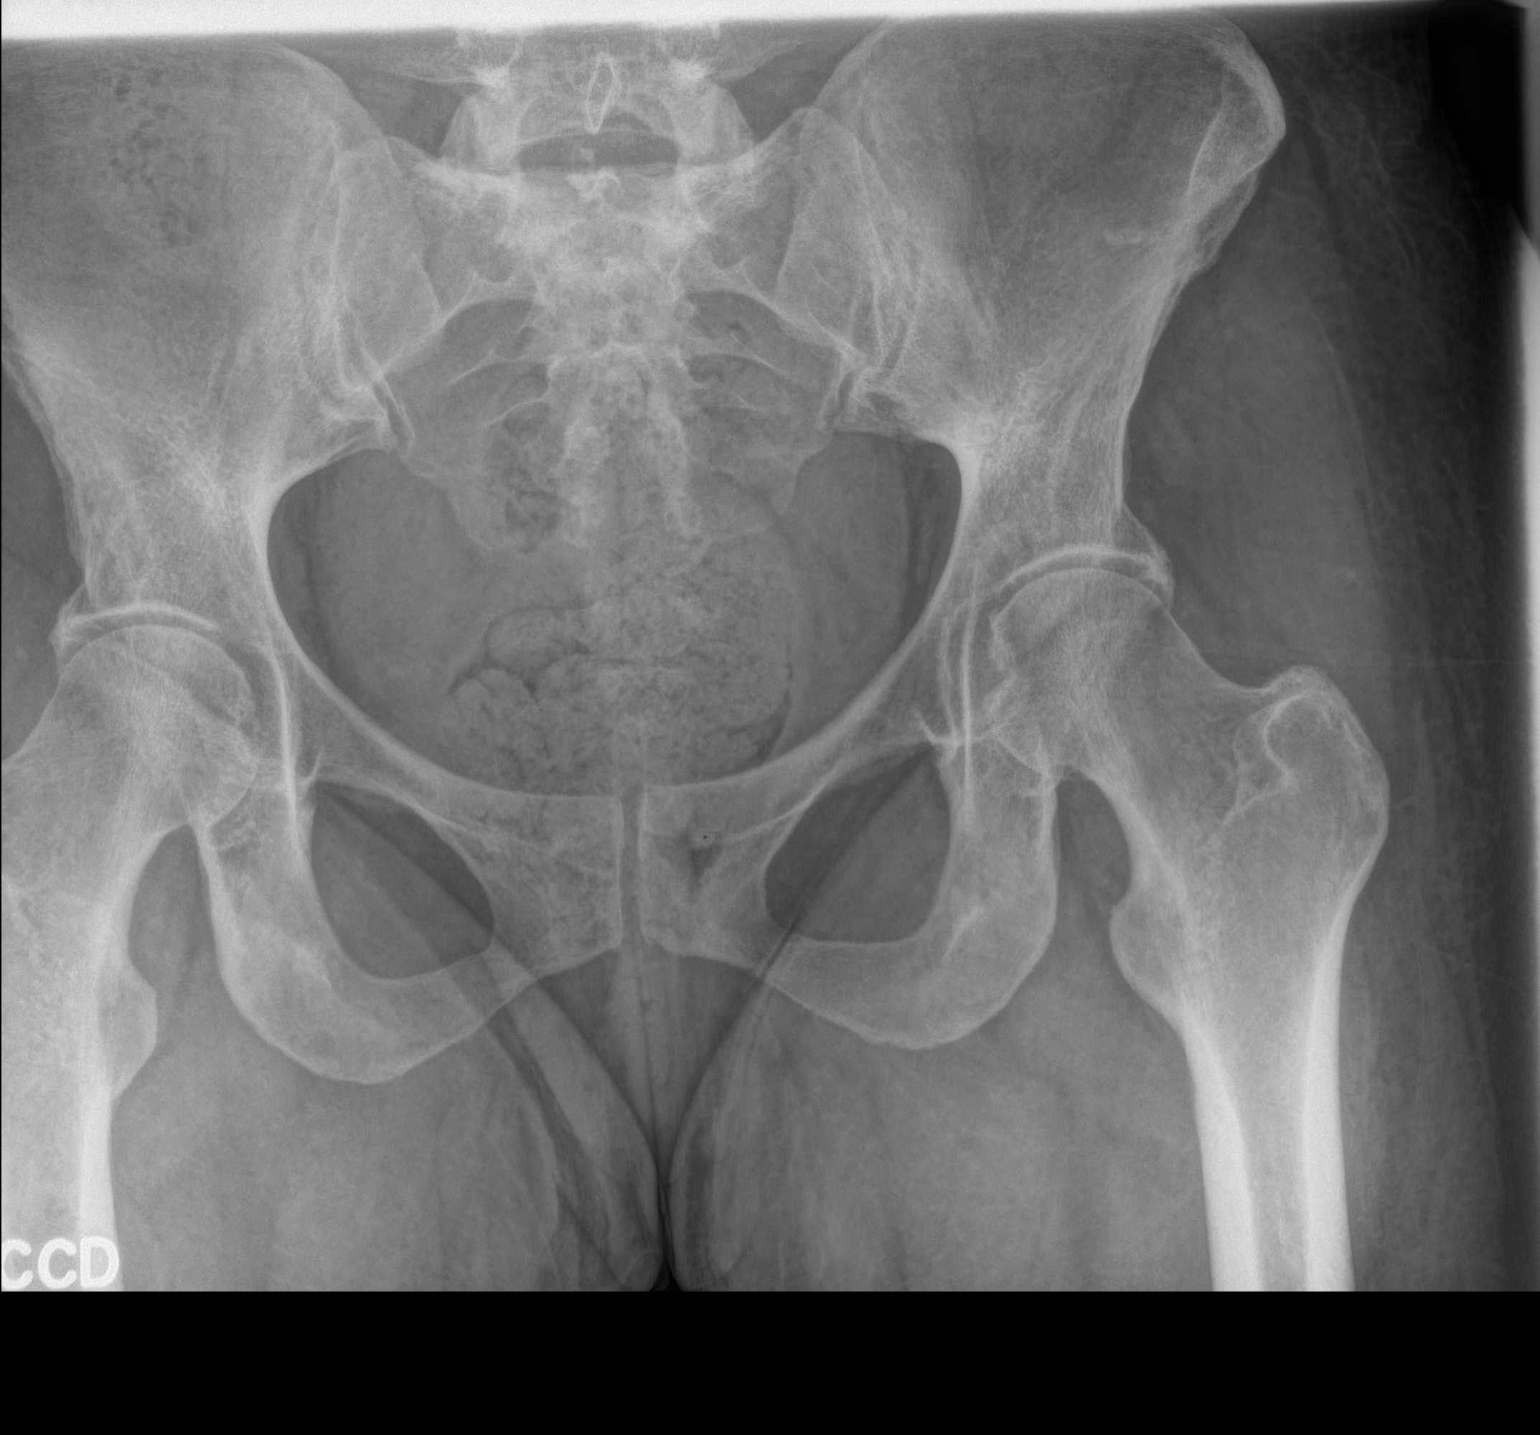

[2 of 2 positions shown; findings below may reference images not displayed]

FINDINGS: No dilated loops of large or small bowel. Fairly extensive stool in
the nondistended colon. No free air or free fluid. No osseous
abnormality. Prior cholecystectomy.
IMPRESSION: Benign appearing abdomen. Moderate stool in the nondistended colon,
less prominent than on the prior CT scan of 09/23/2014.

## 2015-08-12 ENCOUNTER — Telehealth: Payer: Self-pay

## 2015-08-12 NOTE — Telephone Encounter (Signed)
This Case Manager placed call to patient to check status, to remind her of upcoming appointment on 08/16/15 at 1500 with [facility], SW and 1530 with Dr. Jarold Song, and to determine if transportation needed for appointment. Call placed to [phone]; patient's sister answered who indicated patient unavailable. HIPPA compliant message left requesting return call.

## 2015-08-15 ENCOUNTER — Telehealth: Payer: Self-pay

## 2015-08-15 NOTE — Telephone Encounter (Signed)
Attempted to contact the patient to remind her of her appointments tomorrow, 08/16/15 with D [facility] and with Vesta Mixer, SW.  Call placed to [phone] (M) and a HIPAA compliant voice mail message was left requesting a call back to # [phone] or [phone].

## 2015-08-16 ENCOUNTER — Encounter: Payer: Self-pay | Admitting: Family Medicine

## 2015-08-16 ENCOUNTER — Ambulatory Visit (HOSPITAL_BASED_OUTPATIENT_CLINIC_OR_DEPARTMENT_OTHER): Payer: Self-pay | Admitting: Clinical

## 2015-08-16 ENCOUNTER — Ambulatory Visit: Payer: Self-pay | Attending: Family Medicine | Admitting: Family Medicine

## 2015-08-16 VITALS — BP 107/71 | HR 95 | Temp 98.2°F | Resp 15 | Ht 62.0 in | Wt 129.2 lb

## 2015-08-16 DIAGNOSIS — E1140 Type 2 diabetes mellitus with diabetic neuropathy, unspecified: Secondary | ICD-10-CM | POA: Insufficient documentation

## 2015-08-16 DIAGNOSIS — F32A Depression, unspecified: Secondary | ICD-10-CM

## 2015-08-16 DIAGNOSIS — E1040 Type 1 diabetes mellitus with diabetic neuropathy, unspecified: Secondary | ICD-10-CM | POA: Insufficient documentation

## 2015-08-16 DIAGNOSIS — E538 Deficiency of other specified B group vitamins: Secondary | ICD-10-CM | POA: Insufficient documentation

## 2015-08-16 DIAGNOSIS — E1065 Type 1 diabetes mellitus with hyperglycemia: Secondary | ICD-10-CM | POA: Insufficient documentation

## 2015-08-16 DIAGNOSIS — E1143 Type 2 diabetes mellitus with diabetic autonomic (poly)neuropathy: Secondary | ICD-10-CM

## 2015-08-16 DIAGNOSIS — Z79899 Other long term (current) drug therapy: Secondary | ICD-10-CM | POA: Insufficient documentation

## 2015-08-16 DIAGNOSIS — Z794 Long term (current) use of insulin: Secondary | ICD-10-CM | POA: Insufficient documentation

## 2015-08-16 DIAGNOSIS — Z659 Problem related to unspecified psychosocial circumstances: Secondary | ICD-10-CM

## 2015-08-16 DIAGNOSIS — D6489 Other specified anemias: Secondary | ICD-10-CM

## 2015-08-16 DIAGNOSIS — K3184 Gastroparesis: Secondary | ICD-10-CM

## 2015-08-16 DIAGNOSIS — F418 Other specified anxiety disorders: Secondary | ICD-10-CM

## 2015-08-16 DIAGNOSIS — Z888 Allergy status to other drugs, medicaments and biological substances status: Secondary | ICD-10-CM | POA: Insufficient documentation

## 2015-08-16 DIAGNOSIS — Z880 Allergy status to penicillin: Secondary | ICD-10-CM | POA: Insufficient documentation

## 2015-08-16 DIAGNOSIS — E1043 Type 1 diabetes mellitus with diabetic autonomic (poly)neuropathy: Secondary | ICD-10-CM | POA: Insufficient documentation

## 2015-08-16 DIAGNOSIS — F419 Anxiety disorder, unspecified: Secondary | ICD-10-CM | POA: Insufficient documentation

## 2015-08-16 DIAGNOSIS — D638 Anemia in other chronic diseases classified elsewhere: Secondary | ICD-10-CM | POA: Insufficient documentation

## 2015-08-16 DIAGNOSIS — F329 Major depressive disorder, single episode, unspecified: Secondary | ICD-10-CM | POA: Insufficient documentation

## 2015-08-16 HISTORY — DX: Deficiency of other specified B group vitamins: E53.8

## 2015-08-16 LAB — GLUCOSE, POCT (MANUAL RESULT ENTRY): POC Glucose: 174 mg/dl — AB (ref 70–99)

## 2015-08-16 MED ORDER — VITAMIN B-12 1000 MCG PO TABS: 1000.0000 ug | ORAL_TABLET | Freq: Every day | ORAL | Status: DC

## 2015-08-16 MED ORDER — "INSULIN SYRINGE-NEEDLE U-100 31G X 15/64"" 0.5 ML MISC": 1.0000 | Freq: Four times a day (QID) | Status: DC | PRN

## 2015-08-16 NOTE — Progress Notes (Signed)
ASSESSMENT: Pt currently experiencing problem related to psychosocial stressors; needs to f/u with PCP; may benefit from f/u with [facility] for supportive counseling regarding coping with psychosocial stressors. Stage of Change: precontemplative  PLAN: 1. F/U with behavioral health consultant in one month, or earlier, as needed 2. Psychiatric Medications: Buspar, Prozac 3. Behavioral recommendation(s):   -Make appointment to see financial counseling at [facility] upon leaving -Pick up medications in [facility] pharmacy today  SUBJECTIVE: Pt. referred by Dr Jarold Song for psychosocial:  Pt. reports the following symptoms/concerns: Pt states that she is stressed over not having insurance anymore, having her short-term disability come to an end, and being unemployed. Pt has limited time today, as she needs to pick up meds in pharmacy prior to closing. Duration of problem: Out of work about one year Severity: mild  OBJECTIVE: Orientation & Cognition: Oriented x3. Thought processes normal and appropriate to situation. Mood: appropriate. Affect: appropriate Appearance: appropriate Risk of harm to self or others: no known risk of harm to self or others Substance use: none Assessments administered: PHQ2: 0  Diagnosis: Problem related to psychosocial circumstances CPT Code: Z65.9 -------------------------------------------- Other(s) present in the room: none  Time spent with patient in exam room: 12 minutes, 5:10-5:22pm   Depression screen [facility] 2/9 08/16/2015  Decreased Interest 0  Down, Depressed, Hopeless 0  PHQ - 2 Score 0

## 2015-08-16 NOTE — Patient Instructions (Signed)
Gastroparesis °Gastroparesis, also called delayed gastric emptying, is a condition in which food takes longer than normal to empty from the stomach. The condition is usually long-lasting (chronic). °CAUSES °This condition may be caused by: °· An endocrine disorder, such as hypothyroidism or diabetes. Diabetes is the most common cause of this condition. °· A nervous system disease, such as Parkinson disease or multiple sclerosis. °· Cancer, infection, or surgery of the stomach or vagus nerve. °· A connective tissue disorder, such as scleroderma. °· Certain medicines. °In most cases, the cause is not known. °RISK FACTORS °This condition is more likely to develop in: °· People with certain disorders, including endocrine disorders, eating disorders, amyloidosis, and scleroderma. °· People with certain diseases, including Parkinson disease or multiple sclerosis. °· People with cancer or infection of the stomach or vagus nerve. °· People who have had surgery on the stomach or vagus nerve. °· People who take certain medicines. °· Women. °SYMPTOMS °Symptoms of this condition include: °· An early feeling of fullness when eating. °· Nausea. °· Weight loss. °· Vomiting. °· Heartburn. °· Abdominal bloating. °· Inconsistent blood glucose levels. °· Lack of appetite. °· Acid from the stomach coming up into the esophagus (gastroesophageal reflux). °· Spasms of the stomach. °Symptoms may come and go. °DIAGNOSIS °This condition is diagnosed with tests, such as: °· Tests that check how long it takes food to move through the stomach and intestines. These tests include: °¨ Upper gastrointestinal (GI) series. In this test, X-rays of the intestines are taken after you drink a liquid. The liquid makes the intestines show up better on the X-rays. °¨ Gastric emptying scintigraphy. In this test, scans are taken after you eat food that contains a small amount of radioactive material. °¨ Wireless capsule GI monitoring system. This test  involves swallowing a capsule that records information about movement through the stomach. °· Gastric manometry. This test measures electrical and muscular activity in the stomach. It is done with a thin tube that is passed down the throat and into the stomach. °· Endoscopy. This test checks for abnormalities in the lining of the stomach. It is done with a long, thin tube that is passed down the throat and into the stomach. °· An ultrasound. This test can help rule out gallbladder disease or pancreatitis as a cause of your symptoms. It uses sound waves to take pictures of the inside of your body. °TREATMENT °There is no cure for gastroparesis. This condition may be managed with: °· Treatment of the underlying condition causing the gastroparesis. °· Lifestyle changes, including exercise and dietary changes. Dietary changes can include: °¨ Changes in what and when you eat. °¨ Eating smaller meals more often. °¨ Eating low-fat foods. °¨ Eating low-fiber forms of high-fiber foods, such as cooked vegetables instead of raw vegetables. °¨ Having liquid foods in place of solid foods. Liquid foods are easier to digest. °· Medicines. These may be given to control nausea and vomiting and to stimulate stomach muscles. °· Getting food through a feeding tube. This may be done in severe cases. °· A gastric neurostimulator. This is a device that is inserted into the body with surgery. It helps improve stomach emptying and control nausea and vomiting. °HOME CARE INSTRUCTIONS °· Follow your health care provider's instructions about exercise and diet. °· Take medicines only as directed by your health care provider. °SEEK MEDICAL CARE IF: °· Your symptoms do not improve with treatment. °· You have new symptoms. °SEEK IMMEDIATE MEDICAL CARE IF: °· You have   severe abdominal pain that does not improve with treatment. °· You have nausea that does not go away. °· You cannot keep fluids down. °  °This information is not intended to replace  advice given to you by your health care provider. Make sure you discuss any questions you have with your health care provider. °  °Document Released: 06/11/2005 Document Revised: 10/26/2014 Document Reviewed: 06/07/2014 °Elsevier Interactive Patient Education ©2016 [employer] ° °

## 2015-08-16 NOTE — Progress Notes (Signed)
Patient states she has been taking her medications as directed She states she does check her blood sugars and they are "like a yo yo -up and down" She does not record her sugars in a log She is asking for a refill on insulin syringes

## 2015-08-17 NOTE — Progress Notes (Signed)
CC: Follow-up on diabetes mellitus.  HPI: [name] is a 50 y.o. female with a history of type 1 diabetes mellitus (A1c 8.7), diabetic gastroparesis (with multiple ED presentations and hospitalizations for same), anxiety and depression who comes into the clinic for a follow-up visit.  Her most recent hospitalization was from 07/29/15 through 08/01/15 where she was managed for diabetic gastroparesis, DKA and acute pyelonephritis. She was on IV fluids, insulin drip initially which was switched to Levemir and also on IV Rocephin which was later switched to Keflex. Labs also revealed anemia with a discharge hemoglobin of 7.5 and a low vitamin B 12 for which she was placed on vitamin B12 injections.  She informs me that she never picked up her prescription for her B12 injections and is wondering if she can get oral replacement instead She has gained 17 pounds in the last 1 month and is pleased with this recent weight gain as she states that her nausea and vomiting have been at an all-time low in the last 2 weeks. She is also scheduled to see the LCSW for a therapy session today and reports anxiety and depression a lot better. Blood sugars have been swinging from 125 fasting to 246 and she admits to late night eating.  Allergies  Allergen Reactions  . Anesthetics, Amide Nausea And Vomiting  . Penicillins Diarrhea and Nausea And Vomiting    Has patient had a PCN reaction causing immediate rash, facial/tongue/throat swelling, SOB or lightheadedness with hypotension: No Has patient had a PCN reaction causing severe rash involving mucus membranes or skin necrosis: No Has patient had a PCN reaction that required hospitalization No Has patient had a PCN reaction occurring within the last 10 years: Yes  If all of the above answers are "NO", then may proceed with Cephalosporin use.   . Buprenorphine Hcl Rash  . Encainide Nausea And Vomiting   Past Medical History  Diagnosis Date  . Diabetes mellitus  without complication [facility])    Current Outpatient Prescriptions on File Prior to Visit  Medication Sig Dispense Refill  . atorvastatin (LIPITOR) 20 MG tablet Take 1 tablet (20 mg total) by mouth daily at 6 PM. 30 tablet 0  . Blood Glucose Monitoring Suppl (TRUE METRIX METER) DEVI 1 each by Does not apply route 3 (three) times daily before meals. 1 Device 0  . busPIRone (BUSPAR) 5 MG tablet Take 1 tablet (5 mg total) by mouth 3 (three) times daily. 90 tablet 1  . dronabinol (MARINOL) 5 MG capsule Take 5 mg by mouth 2 (two) times daily.    Marland Kitchen FLUoxetine (PROZAC) 40 MG capsule Take 40 mg by mouth daily.    Marland Kitchen glucose blood (TRUE METRIX BLOOD GLUCOSE TEST) test strip Use 3 times daily before meals 100 each 5  . insulin aspart (NOVOLOG) 100 UNIT/ML injection Inject 0-15 Units into the skin 3 (three) times daily with meals. 2-15 units three times daily with food. Per sliding scale 1 vial 12  . insulin detemir (LEVEMIR) 100 UNIT/ML injection Inject 0.2 mLs (20 Units total) into the skin daily. 10 mL 11  . metoCLOPramide (REGLAN) 10 MG tablet Take 1 tablet (10 mg total) by mouth every 6 (six) hours as needed for nausea. 20 tablet 0  . ondansetron (ZOFRAN ODT) 4 MG disintegrating tablet Take 1 tablet (4 mg total) by mouth every 8 (eight) hours as needed for nausea. 40 tablet 0  . pantoprazole (PROTONIX) 40 MG tablet Take 40 mg by mouth daily. Reported  on 07/19/2015    . polyethylene glycol powder (GLYCOLAX/MIRALAX) powder Take 17 g by mouth daily as needed for moderate constipation. Reported on 07/19/2015    . pregabalin (LYRICA) 150 MG capsule Take 1 capsule (150 mg total) by mouth 2 (two) times daily. 60 capsule 0  . TRUEPLUS LANCETS 28G MISC 1 each by Does not apply route 3 (three) times daily before meals. 100 each 5   No current facility-administered medications on file prior to visit.   Family History  Problem Relation Age of Onset  . Cystic fibrosis Mother   . Hypertension Father   . Diabetes  Brother   . Hypertension Maternal Grandmother    Social History   Social History  . Marital Status: Single    Spouse Name: N/A  . Number of Children: N/A  . Years of Education: N/A   Occupational History  . Not on file.   Social History Main Topics  . Smoking status: Never Smoker   . Smokeless tobacco: Never Used  . Alcohol Use: No  . Drug Use: No  . Sexual Activity: Not Currently   Other Topics Concern  . Not on file   Social History Narrative    Review of Systems: Constitutional: Negative for fever, chills, diaphoresis, activity change, appetite change and fatigue. HENT: Negative for ear pain, nosebleeds, congestion, facial swelling, rhinorrhea, neck pain, neck stiffness and ear discharge.  Eyes: Negative for pain, discharge, redness, itching and visual disturbance. Respiratory: Negative for cough, choking, chest tightness, shortness of breath, wheezing and stridor.  Cardiovascular: Negative for chest pain, palpitations and leg swelling. Gastrointestinal: Negative for abdominal distention. Genitourinary: Negative for dysuria, urgency, frequency, hematuria, flank pain, decreased urine volume, difficulty urinating and dyspareunia.  Musculoskeletal: Negative for back pain, joint swelling, arthralgias and gait problem. Neurological: Negative for dizziness, tremors, seizures, syncope, facial asymmetry, speech difficulty, weakness, light-headedness, numbness and headaches.  Hematological: Negative for adenopathy. Does not bruise/bleed easily. Psychiatric/Behavioral: Negative for hallucinations, behavioral problems, confusion, dysphoric mood, decreased concentration and agitation.    Objective:   Filed Vitals:   08/16/15 1533  BP: 107/71  Pulse: 95  Temp: 98.2 F (36.8 C)  Resp: 15   Wt Readings from Last 3 Encounters:  08/16/15 129 lb 3.2 oz (58.605 kg)  07/29/15 111 lb 8.8 oz (50.6 kg)  07/19/15 112 lb (50.803 kg)    Physical Exam: Constitutional: Patient  appears well-developed and well-nourished. No distress. HENT: Normocephalic, atraumatic, External right and left ear normal. Oropharynx is clear and moist.  Eyes: Conjunctivae and EOM are normal. PERRLA, no scleral icterus. Neck: Normal ROM. Neck supple. No JVD. No tracheal deviation. No thyromegaly. CVS: RRR, S1/S2 +, no murmurs, no gallops, no carotid bruit.  Pulmonary: Effort and breath sounds normal, no stridor, rhonchi, wheezes, rales.  Abdominal: Soft. BS +,  no distension, tenderness, rebound or guarding.  Musculoskeletal: Normal range of motion. No edema and no tenderness.  Lymphadenopathy: No lymphadenopathy noted, cervical, inguinal or axillary Neuro: Alert. Normal reflexes, muscle tone coordination. No cranial nerve deficit. Skin: Skin is warm and dry. No rash noted. Not diaphoretic. No erythema. No pallor. Psychiatric: Normal mood and affect. Behavior, judgment, thought content normal.  Lab Results  Component Value Date   WBC 5.0 08/01/2015   HGB 7.5* 08/01/2015   HCT 25.2* 08/01/2015   MCV 82.9 08/01/2015   PLT 321 08/01/2015   Lab Results  Component Value Date   CREATININE 0.97 08/01/2015   BUN 13 08/01/2015   NA 140 08/01/2015  K 4.2 08/01/2015   CL 115* 08/01/2015   CO2 20* 08/01/2015    Lab Results  Component Value Date   HGBA1C 8.7* 07/29/2015   Lipid Panel     Component Value Date/Time   CHOL 187 04/04/2012 0550   TRIG 110 04/04/2012 0550   HDL 49 04/04/2012 0550   CHOLHDL 3.8 04/04/2012 0550   VLDL 22 04/04/2012 0550   LDLCALC 116* 04/04/2012 0550   CBC Latest Ref Rng 08/01/2015 07/31/2015 07/30/2015  WBC 4.0 - 10.5 K/uL 5.0 6.5 13.4(H)  Hemoglobin 12.0 - 15.0 g/dL 7.5(L) 7.6(L) 8.6(L)  Hematocrit 36.0 - 46.0 % 25.2(L) 25.1(L) 29.0(L)  Platelets 150 - 400 K/uL 321 369 501(H)       Assessment and plan:  1. Type I diabetes mellitus with complication, uncontrolled (HCC) A1c trended up from  7.3 in 04/2015 to 8.7 She has been taking 15 units of  Lantus and so I have advised her to take 20 units daily Advised against late-night eating. - Glucose (CBG) - HgB A1c - lisinopril (PRINIVIL,ZESTRIL) 5 MG tablet; Take 1 tablet (5 mg total) by mouth daily.  Dispense: 30 tablet; Refill: 2 - glucose blood (TRUE METRIX BLOOD GLUCOSE TEST) test strip; Use 3 times daily before meals  Dispense: 100 each; Refill: 5 - Blood Glucose Monitoring Suppl (TRUE METRIX METER) DEVI; 1 each by Does not apply route 3 (three) times daily before meals.  Dispense: 1 Device; Refill: 0 - TRUEPLUS LANCETS 28G MISC; 1 each by Does not apply route 3 (three) times daily before meals.  Dispense: 100 each; Refill: 5   2. Anxiety and depression Currently on Prozac. LCSW called in to see the patient - busPIRone (BUSPAR) 5 MG tablet; Take 1 tablet (5 mg total) by mouth 3 (three) times daily.  Dispense: 90 tablet; Refill: 1  3. Diabetic neuropathy, type I diabetes mellitus ([place]) Remains on gabapentin - pregabalin (LYRICA) 150 MG capsule; Take 1 capsule (150 mg total) by mouth 2 (two) times daily.  Dispense: 60 capsule; Refill: 0  4. Diabetic gastroparesis (HCC) - ondansetron (ZOFRAN ODT) 4 MG disintegrating tablet; Take 1 tablet (4 mg total) by mouth every 8 (eight) hours as needed for nausea.  Dispense: 40 tablet; Refill: 0  5. Anemia Multifactorial-from B12 deficiency, anemia of chronic disease Repeat CBC today and will place on iron pills if still low. Colonoscopy is also a consideration  6. Vitamin B12 deficiency. Placed on oral vitamin B12 assessment per patient request.       [name], MD. [facility] [phone] 08/17/2015, 9:55 AM

## 2015-08-20 IMAGING — CR DG ABDOMEN ACUTE W/ 1V CHEST
3 series · 3 of 3 positions shown · non-contrast
Comparison: Chest radiograph October 07, 2013 ; abdomen radiograph
October 20, 2013

CLINICAL DATA: Nausea and vomiting for 1 day

EXAM:
DG ABDOMEN ACUTE W/ 1V CHEST

[w chest pa]
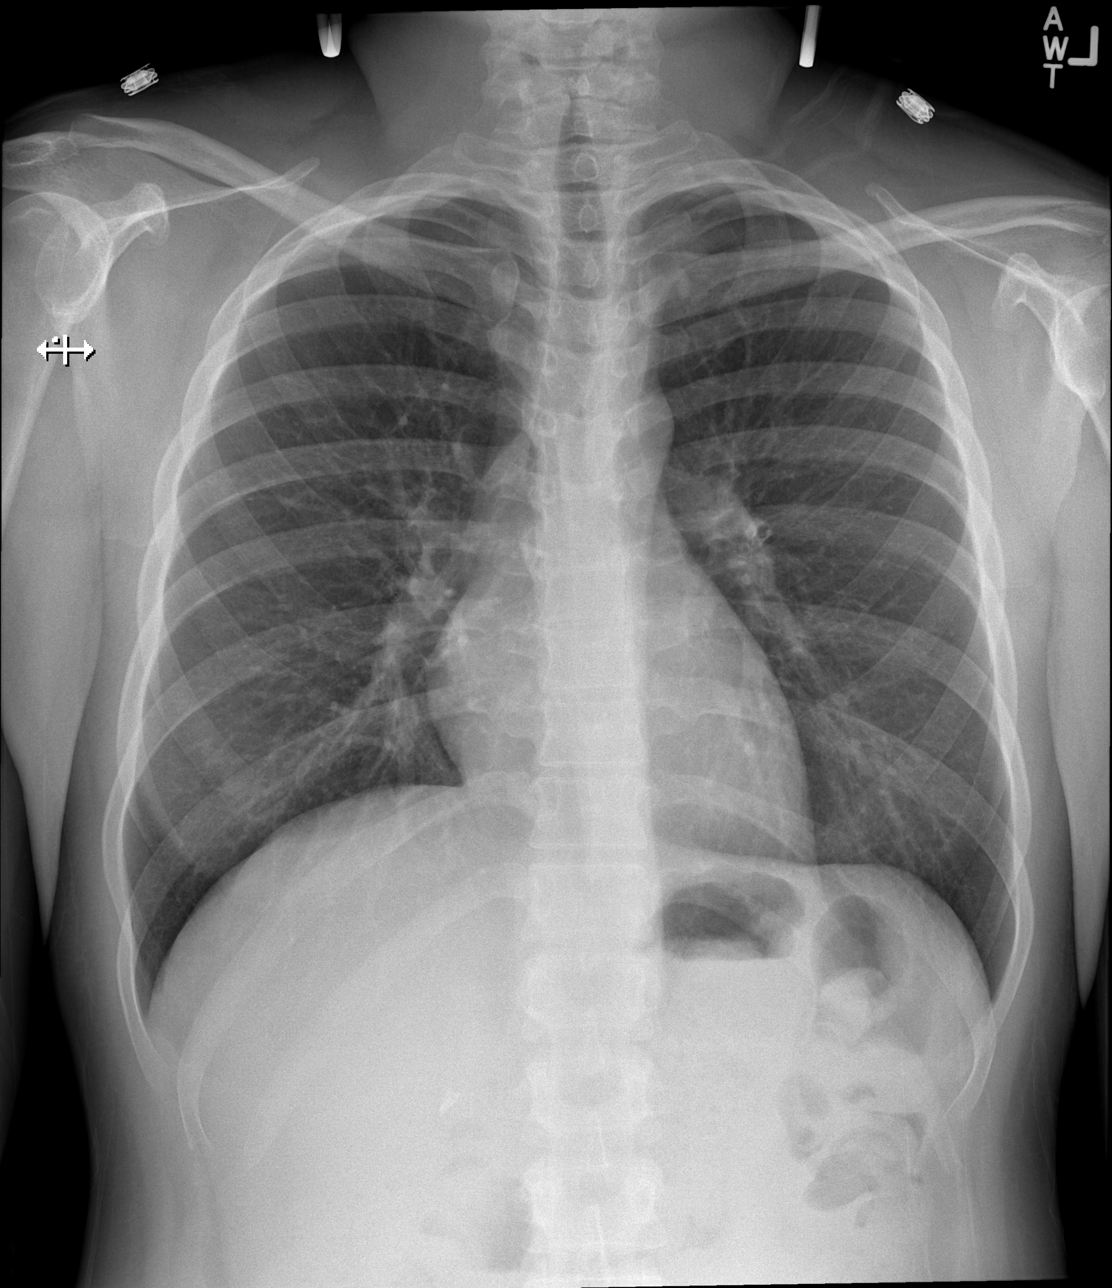

[w abdomen upright]
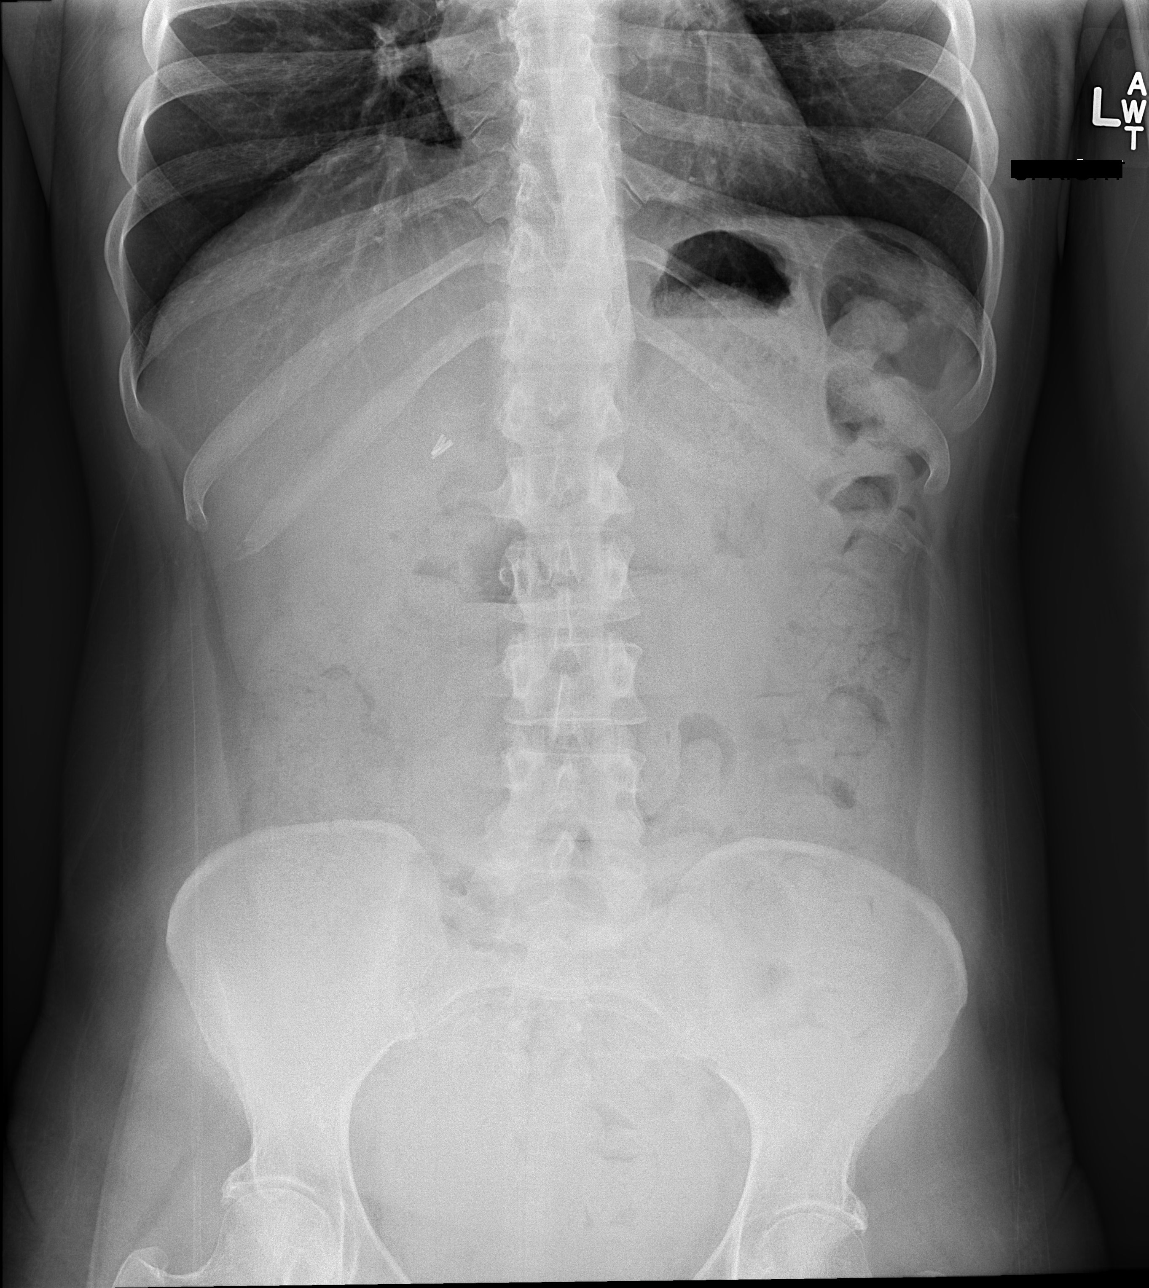

[t abdomen supine]
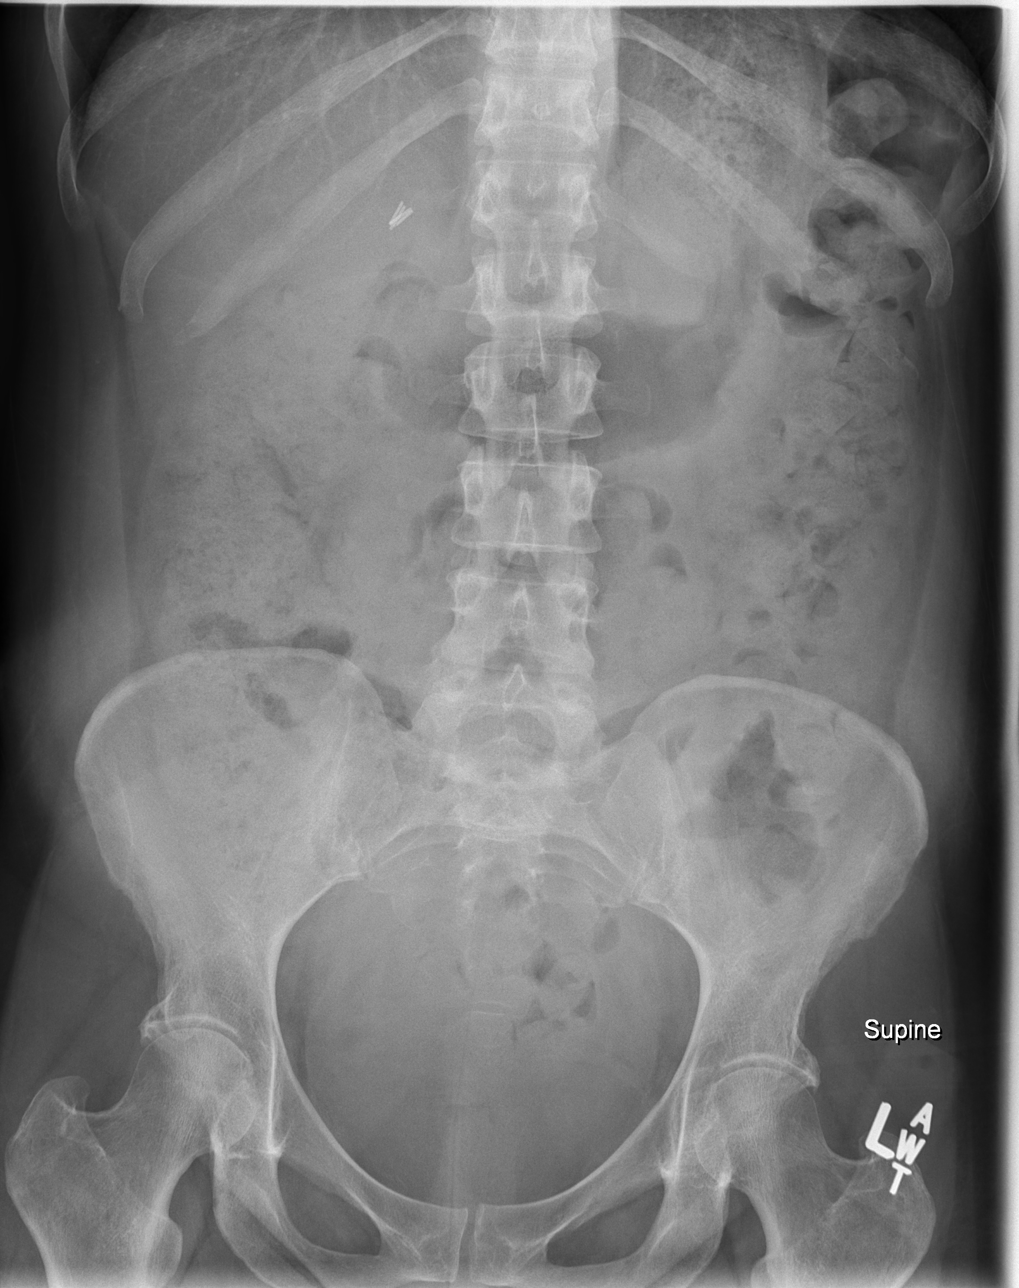

[3 of 3 positions shown; findings below may reference images not displayed]

FINDINGS: PA chest: Lungs are clear. Heart size and pulmonary vascularity are
normal. No adenopathy.

Supine and upright abdomen: There is diffuse stool throughout the
colon. There is no bowel dilatation or air-fluid level suggesting
obstruction. No free air. No abnormal calcifications. There are
surgical clips in right upper quadrant.
IMPRESSION: Diffuse stool throughout colon. Overall bowel gas pattern
unremarkable. Lungs clear.

## 2015-08-29 ENCOUNTER — Telehealth: Payer: Self-pay

## 2015-08-29 NOTE — Telephone Encounter (Signed)
Attempted to contact the patient to check on her status and inquire if her insurance issues with her former employer have been resolved. Call placed to [phone] (M) and a HIPAA compliant voice mail message was left requesting a call back to # [phone] or [phone].

## 2015-09-01 ENCOUNTER — Telehealth: Payer: Self-pay

## 2015-09-01 NOTE — Telephone Encounter (Signed)
This Case Manager placed call to patient to check on status, to determine if her insurance issues with her former employee have been resolved, and to discuss scheduling follow-up appointment with Dr. Jarold Song. Call placed to [phone]; unable to reach patient. HIPPA compliant voicemail left requesting return call.

## 2015-09-02 ENCOUNTER — Encounter (HOSPITAL_COMMUNITY): Payer: Self-pay

## 2015-09-02 ENCOUNTER — Inpatient Hospital Stay (HOSPITAL_COMMUNITY)
Admission: EM | Admit: 2015-09-02 | Discharge: 2015-09-05 | DRG: 638 | Disposition: A | Discharge status: Home / Self Care | Payer: 59 | Attending: Internal Medicine | Admitting: Internal Medicine

## 2015-09-02 DIAGNOSIS — F419 Anxiety disorder, unspecified: Secondary | ICD-10-CM | POA: Diagnosis present

## 2015-09-02 DIAGNOSIS — E871 Hypo-osmolality and hyponatremia: Secondary | ICD-10-CM | POA: Diagnosis present

## 2015-09-02 DIAGNOSIS — N179 Acute kidney failure, unspecified: Secondary | ICD-10-CM | POA: Insufficient documentation

## 2015-09-02 DIAGNOSIS — D509 Iron deficiency anemia, unspecified: Secondary | ICD-10-CM | POA: Insufficient documentation

## 2015-09-02 DIAGNOSIS — Z8249 Family history of ischemic heart disease and other diseases of the circulatory system: Secondary | ICD-10-CM

## 2015-09-02 DIAGNOSIS — E872 Acidosis, unspecified: Secondary | ICD-10-CM | POA: Insufficient documentation

## 2015-09-02 DIAGNOSIS — D519 Vitamin B12 deficiency anemia, unspecified: Secondary | ICD-10-CM | POA: Diagnosis present

## 2015-09-02 DIAGNOSIS — Z884 Allergy status to anesthetic agent status: Secondary | ICD-10-CM

## 2015-09-02 DIAGNOSIS — K3184 Gastroparesis: Secondary | ICD-10-CM | POA: Diagnosis present

## 2015-09-02 DIAGNOSIS — N920 Excessive and frequent menstruation with regular cycle: Secondary | ICD-10-CM | POA: Diagnosis present

## 2015-09-02 DIAGNOSIS — E10649 Type 1 diabetes mellitus with hypoglycemia without coma: Secondary | ICD-10-CM | POA: Diagnosis present

## 2015-09-02 DIAGNOSIS — E1110 Type 2 diabetes mellitus with ketoacidosis without coma: Secondary | ICD-10-CM | POA: Diagnosis present

## 2015-09-02 DIAGNOSIS — E1040 Type 1 diabetes mellitus with diabetic neuropathy, unspecified: Secondary | ICD-10-CM | POA: Diagnosis not present

## 2015-09-02 DIAGNOSIS — E1043 Type 1 diabetes mellitus with diabetic autonomic (poly)neuropathy: Secondary | ICD-10-CM | POA: Diagnosis present

## 2015-09-02 DIAGNOSIS — E876 Hypokalemia: Secondary | ICD-10-CM | POA: Diagnosis not present

## 2015-09-02 DIAGNOSIS — Z79899 Other long term (current) drug therapy: Secondary | ICD-10-CM

## 2015-09-02 DIAGNOSIS — E1010 Type 1 diabetes mellitus with ketoacidosis without coma: Secondary | ICD-10-CM | POA: Diagnosis not present

## 2015-09-02 DIAGNOSIS — E1143 Type 2 diabetes mellitus with diabetic autonomic (poly)neuropathy: Secondary | ICD-10-CM | POA: Diagnosis present

## 2015-09-02 DIAGNOSIS — Z888 Allergy status to other drugs, medicaments and biological substances status: Secondary | ICD-10-CM

## 2015-09-02 DIAGNOSIS — Z880 Allergy status to penicillin: Secondary | ICD-10-CM

## 2015-09-02 DIAGNOSIS — I878 Other specified disorders of veins: Secondary | ICD-10-CM

## 2015-09-02 DIAGNOSIS — E860 Dehydration: Secondary | ICD-10-CM | POA: Diagnosis present

## 2015-09-02 DIAGNOSIS — Z794 Long term (current) use of insulin: Secondary | ICD-10-CM

## 2015-09-02 DIAGNOSIS — E861 Hypovolemia: Secondary | ICD-10-CM | POA: Diagnosis present

## 2015-09-02 DIAGNOSIS — N183 Chronic kidney disease, stage 3 (moderate): Secondary | ICD-10-CM | POA: Diagnosis present

## 2015-09-02 DIAGNOSIS — Z833 Family history of diabetes mellitus: Secondary | ICD-10-CM

## 2015-09-02 DIAGNOSIS — E1022 Type 1 diabetes mellitus with diabetic chronic kidney disease: Secondary | ICD-10-CM | POA: Diagnosis present

## 2015-09-02 DIAGNOSIS — E870 Hyperosmolality and hypernatremia: Secondary | ICD-10-CM | POA: Diagnosis present

## 2015-09-02 LAB — BASIC METABOLIC PANEL
ANION GAP: 11 (ref 5–15)
ANION GAP: 9 (ref 5–15)
BUN: 14 mg/dL (ref 6–20)
BUN: 14 mg/dL (ref 6–20)
CALCIUM: 9.5 mg/dL (ref 8.9–10.3)
CALCIUM: 9.2 mg/dL (ref 8.9–10.3)
CO2: 15 mmol/L — AB (ref 22–32)
CO2: 17 mmol/L — AB (ref 22–32)
CREATININE: 0.9 mg/dL (ref 0.44–1.00)
Chloride: 119 mmol/L — ABNORMAL HIGH (ref 101–111)
Chloride: 118 mmol/L — ABNORMAL HIGH (ref 101–111)
Creatinine, Ser: 0.86 mg/dL (ref 0.44–1.00)
GFR calc Af Amer: >60 mL/min (ref >60)
GFR calc Af Amer: >60 mL/min (ref >60)
GFR calc non Af Amer: >60 mL/min (ref >60)
GFR calc non Af Amer: >60 mL/min (ref >60)
GLUCOSE: 188 mg/dL — AB (ref 65–99)
GLUCOSE: 130 mg/dL — AB (ref 65–99)
Potassium: 4.1 mmol/L (ref 3.5–5.1)
Potassium: 3.5 mmol/L (ref 3.5–5.1)
Sodium: 145 mmol/L (ref 135–145)
Sodium: 144 mmol/L (ref 135–145)

## 2015-09-02 LAB — CBG MONITORING, ED
GLUCOSE-CAPILLARY: 378 mg/dL — AB (ref 65–99)
GLUCOSE-CAPILLARY: 337 mg/dL — AB (ref 65–99)
GLUCOSE-CAPILLARY: 258 mg/dL — AB (ref 65–99)
GLUCOSE-CAPILLARY: 194 mg/dL — AB (ref 65–99)
GLUCOSE-CAPILLARY: 158 mg/dL — AB (ref 65–99)
Glucose-Capillary: 486 mg/dL — ABNORMAL HIGH (ref 65–99)
Glucose-Capillary: 222 mg/dL — ABNORMAL HIGH (ref 65–99)

## 2015-09-02 LAB — COMPREHENSIVE METABOLIC PANEL
ALBUMIN: 4.6 g/dL (ref 3.5–5.0)
ALK PHOS: 129 U/L — AB (ref 38–126)
ALT: 36 U/L (ref 14–54)
ANION GAP: 22 — AB (ref 5–15)
AST: 37 U/L (ref 15–41)
BILIRUBIN TOTAL: 1.6 mg/dL — AB (ref 0.3–1.2)
BUN: 20 mg/dL (ref 6–20)
CALCIUM: 9.7 mg/dL (ref 8.9–10.3)
CO2: 10 mmol/L — ABNORMAL LOW (ref 22–32)
CREATININE: 1.27 mg/dL — AB (ref 0.44–1.00)
Chloride: 100 mmol/L — ABNORMAL LOW (ref 101–111)
GFR calc non Af Amer: 49 mL/min — ABNORMAL LOW (ref >60)
GFR, EST AFRICAN AMERICAN: 57 mL/min — AB (ref >60)
GLUCOSE: 531 mg/dL — AB (ref 65–99)
Potassium: 4.5 mmol/L (ref 3.5–5.1)
Sodium: 132 mmol/L — ABNORMAL LOW (ref 135–145)
TOTAL PROTEIN: 8.8 g/dL — AB (ref 6.5–8.1)

## 2015-09-02 LAB — MRSA PCR SCREENING: MRSA by PCR: NEGATIVE

## 2015-09-02 LAB — URINALYSIS, ROUTINE W REFLEX MICROSCOPIC
BILIRUBIN URINE: NEGATIVE
Bilirubin Urine: NEGATIVE
Glucose, UA: >1000 mg/dL — AB
LEUKOCYTES UA: NEGATIVE
Leukocytes, UA: NEGATIVE
NITRITE: NEGATIVE
Nitrite: NEGATIVE
PH: 6 (ref 5.0–8.0)
PH: 5.5 (ref 5.0–8.0)
PROTEIN: NEGATIVE mg/dL
Protein, ur: 30 mg/dL — AB
Specific Gravity, Urine: 1.025 (ref 1.005–1.030)
Specific Gravity, Urine: 1.022 (ref 1.005–1.030)

## 2015-09-02 LAB — GLUCOSE, CAPILLARY
GLUCOSE-CAPILLARY: 177 mg/dL — AB (ref 65–99)
GLUCOSE-CAPILLARY: 157 mg/dL — AB (ref 65–99)
GLUCOSE-CAPILLARY: 162 mg/dL — AB (ref 65–99)
GLUCOSE-CAPILLARY: 129 mg/dL — AB (ref 65–99)
Glucose-Capillary: 213 mg/dL — ABNORMAL HIGH (ref 65–99)

## 2015-09-02 LAB — URINE MICROSCOPIC-ADD ON: Bacteria, UA: NONE SEEN

## 2015-09-02 LAB — CBC WITH DIFFERENTIAL/PLATELET
BASOS PCT: 0 %
Basophils Absolute: 0 10*3/uL (ref 0.0–0.1)
EOS PCT: 0 %
Eosinophils Absolute: 0 10*3/uL (ref 0.0–0.7)
HEMATOCRIT: 31.6 % — AB (ref 36.0–46.0)
Hemoglobin: 9.3 g/dL — ABNORMAL LOW (ref 12.0–15.0)
LYMPHS ABS: 0.5 10*3/uL — AB (ref 0.7–4.0)
Lymphocytes Relative: 4 %
MCH: 22.8 pg — ABNORMAL LOW (ref 26.0–34.0)
MCHC: 29.4 g/dL — AB (ref 30.0–36.0)
MCV: 77.5 fL — AB (ref 78.0–100.0)
MONOS PCT: 4 %
Monocytes Absolute: 0.5 10*3/uL (ref 0.1–1.0)
NEUTROS ABS: 12.5 10*3/uL — AB (ref 1.7–7.7)
Neutrophils Relative %: 92 %
Platelets: 460 10*3/uL — ABNORMAL HIGH (ref 150–400)
RBC: 4.08 MIL/uL (ref 3.87–5.11)
RDW: 16.6 % — AB (ref 11.5–15.5)
WBC: 13.5 10*3/uL — ABNORMAL HIGH (ref 4.0–10.5)

## 2015-09-02 LAB — BLOOD GAS, VENOUS
ACID-BASE DEFICIT: 12.7 mmol/L — AB (ref 0.0–2.0)
Bicarbonate: 12.1 mEq/L — ABNORMAL LOW (ref 20.0–24.0)
O2 SAT: 29.7 %
PATIENT TEMPERATURE: 98.6
PH VEN: 7.307 — AB (ref 7.250–7.300)
TCO2: 11.7 mmol/L (ref 0–100)
pCO2, Ven: 24.9 mmHg — ABNORMAL LOW (ref 45.0–50.0)

## 2015-09-02 LAB — LIPASE, BLOOD: Lipase: 29 U/L (ref 11–51)

## 2015-09-02 LAB — TROPONIN I

## 2015-09-02 MED ORDER — ONDANSETRON HCL 4 MG/2ML IJ SOLN
4.0000 mg | Freq: Once | INTRAMUSCULAR | Status: AC
  Administered 2015-09-02: 4 mg via INTRAVENOUS
  Filled 2015-09-02: qty 2

## 2015-09-02 MED ORDER — SODIUM CHLORIDE 0.9 % IV SOLN
INTRAVENOUS | Status: DC
  Administered 2015-09-02: 4.9 [IU]/h via INTRAVENOUS
  Filled 2015-09-02: qty 2.5

## 2015-09-02 MED ORDER — ENOXAPARIN SODIUM 40 MG/0.4ML ~~LOC~~ SOLN
40.0000 mg | SUBCUTANEOUS | Status: DC
  Administered 2015-09-02 – 2015-09-04 (×3): 40 mg via SUBCUTANEOUS
  Filled 2015-09-02 (×3): qty 0.4

## 2015-09-02 MED ORDER — METOCLOPRAMIDE HCL 5 MG/ML IJ SOLN
10.0000 mg | Freq: Three times a day (TID) | INTRAMUSCULAR | Status: DC
  Administered 2015-09-02 – 2015-09-04 (×5): 10 mg via INTRAVENOUS
  Filled 2015-09-02 (×5): qty 2

## 2015-09-02 MED ORDER — PANTOPRAZOLE SODIUM 40 MG IV SOLR
40.0000 mg | INTRAVENOUS | Status: DC
  Administered 2015-09-02: 40 mg via INTRAVENOUS

## 2015-09-02 MED ORDER — METOCLOPRAMIDE HCL 5 MG/ML IJ SOLN
10.0000 mg | INTRAMUSCULAR | Status: AC
  Administered 2015-09-02: 10 mg via INTRAVENOUS
  Filled 2015-09-02: qty 2

## 2015-09-02 MED ORDER — SODIUM CHLORIDE 0.9 % IV BOLUS (SEPSIS)
2000.0000 mL | Freq: Once | INTRAVENOUS | Status: AC
  Administered 2015-09-02: 2000 mL via INTRAVENOUS

## 2015-09-02 MED ORDER — SODIUM CHLORIDE 0.9 % IV BOLUS (SEPSIS)
1000.0000 mL | Freq: Once | INTRAVENOUS | Status: AC
  Administered 2015-09-02: 1000 mL via INTRAVENOUS

## 2015-09-02 MED ORDER — PANTOPRAZOLE SODIUM 40 MG IV SOLR
40.0000 mg | Freq: Every day | INTRAVENOUS | Status: DC
  Administered 2015-09-03 – 2015-09-04 (×2): 40 mg via INTRAVENOUS
  Filled 2015-09-02 (×2): qty 40

## 2015-09-02 MED ORDER — MORPHINE SULFATE (PF) 2 MG/ML IV SOLN
2.0000 mg | INTRAVENOUS | Status: DC | PRN
  Administered 2015-09-02 (×3): 2 mg via INTRAVENOUS
  Filled 2015-09-02 (×3): qty 1

## 2015-09-02 MED ORDER — SODIUM CHLORIDE 0.9 % IV SOLN
INTRAVENOUS | Status: AC
  Administered 2015-09-02: 13:00:00 via INTRAVENOUS

## 2015-09-02 MED ORDER — LORAZEPAM 2 MG/ML IJ SOLN
1.0000 mg | Freq: Once | INTRAMUSCULAR | Status: AC
  Administered 2015-09-02: 1 mg via INTRAVENOUS
  Filled 2015-09-02: qty 1

## 2015-09-02 MED ORDER — POTASSIUM CHLORIDE 10 MEQ/100ML IV SOLN: 10.0000 meq | INTRAVENOUS | Status: AC

## 2015-09-02 MED ORDER — SODIUM CHLORIDE 0.9 % IV SOLN: INTRAVENOUS | Status: DC

## 2015-09-02 MED ORDER — PREGABALIN 75 MG PO CAPS
150.0000 mg | ORAL_CAPSULE | Freq: Two times a day (BID) | ORAL | Status: DC
  Administered 2015-09-02 – 2015-09-05 (×7): 150 mg via ORAL
  Filled 2015-09-02 (×6): qty 2

## 2015-09-02 MED ORDER — BUSPIRONE HCL 5 MG PO TABS
5.0000 mg | ORAL_TABLET | Freq: Three times a day (TID) | ORAL | Status: DC
  Administered 2015-09-02 – 2015-09-05 (×9): 5 mg via ORAL
  Filled 2015-09-02 (×12): qty 1

## 2015-09-02 MED ORDER — SODIUM CHLORIDE 0.9 % IV SOLN
INTRAVENOUS | Status: DC
  Administered 2015-09-02: 4.7 [IU]/h via INTRAVENOUS
  Filled 2015-09-02: qty 2.5

## 2015-09-02 MED ORDER — DEXTROSE-NACL 5-0.45 % IV SOLN
INTRAVENOUS | Status: DC
  Administered 2015-09-02: 100 mL/h via INTRAVENOUS

## 2015-09-02 MED ORDER — FLUOXETINE HCL 20 MG PO CAPS
40.0000 mg | ORAL_CAPSULE | Freq: Every day | ORAL | Status: DC
  Administered 2015-09-03 – 2015-09-05 (×3): 40 mg via ORAL
  Filled 2015-09-02 (×3): qty 2

## 2015-09-02 MED ORDER — LORAZEPAM 2 MG/ML IJ SOLN
1.0000 mg | Freq: Once | INTRAMUSCULAR | Status: AC
  Administered 2015-09-02: 1 mg via INTRAMUSCULAR
  Filled 2015-09-02: qty 1

## 2015-09-02 MED ORDER — LORAZEPAM 2 MG/ML IJ SOLN
0.5000 mg | Freq: Four times a day (QID) | INTRAMUSCULAR | Status: DC | PRN
  Administered 2015-09-02: 0.5 mg via INTRAVENOUS
  Filled 2015-09-02: qty 1

## 2015-09-02 NOTE — ED Notes (Signed)
CBG 158  

## 2015-09-02 NOTE — ED Notes (Signed)
CBG 258 

## 2015-09-02 NOTE — Hospital Discharge Follow-Up (Signed)
[facility] [place]:  Patient known to the [facility] [place]. This Case Manager met with patient at beside to determine her plans for post-discharge follow-up. Also wanted to determine if patient has rectified insurance issues with former employer. Patient currently listed as having [employer]. Patient indicated she is uninsured at this time but indicated she was very nauseous and did not want to talk. Patient does have an appointment scheduled on 09/20/15 at 1400 with Dr. Jarold Song. Will update AVS with scheduled appointment. Will also continue to follow patient's clinical progress.

## 2015-09-02 NOTE — ED Notes (Signed)
Iv consult ordered.  This RN attempted IV x2.  Patient is difficult stick.

## 2015-09-02 NOTE — ED Notes (Signed)
CBG 337 

## 2015-09-02 NOTE — ED Notes (Signed)
This RN attempted to start 2nd IV unsuccessfully. Pt has been stuck multiple times in ED without success. IV team consulted.

## 2015-09-02 NOTE — ED Notes (Signed)
MD paged to see if PICC line order can be changed to have IR place.

## 2015-09-02 NOTE — ED Provider Notes (Signed)
CSN: [id number]     Arrival date & time 09/02/15  0347 History   First MD Initiated Contact with Patient 09/02/15 [phone]     Chief Complaint  Patient presents with  . Emesis  . Hyperglycemia     (Consider location/radiation/quality/duration/timing/severity/associated sxs/prior Treatment) HPI Comments: 50 year old female with a history of diabetes mellitus presents to the emergency department for nausea and vomiting. Patient states that symptoms began 2 days ago. They have been worsening since onset. Patient reports compliance with her insulin regimen; however, she is hyperglycemic today. She has a history of multiple hospitalizations for diabetic ketoacidosis. Patient currently c/o "panic attacks" which happen when she gets "sick". No medications given PTA. No reported fever, hematemesis, or hemoptysis. No hx of abdominal surgeries. Patient with known hx of gastroparesis secondary to diabetes.  Patient is a 50 y.o. female presenting with vomiting and hyperglycemia. The history is provided by the patient. No language interpreter was used.  Emesis Associated symptoms: abdominal pain   Hyperglycemia Associated symptoms: abdominal pain, nausea, shortness of breath and vomiting   Associated symptoms: no fever     Past Medical History  Diagnosis Date  . Diabetes mellitus without complication [facility])    Past Surgical History  Procedure Laterality Date  . Retinal detachment surgery    . Posterior vitrectomy and membrane peel-left eye  09/28/2002  . Posterior vitrectomy and membrane peel-right eye  03/16/2002  . Eye surgery    . Gailstones    . Esophagogastroduodenoscopy  09/27/2014    at [facility], Dr Rolan Lipa. biospy neg for celiac, neg for H pylori.   . Colonoscopy  09/27/2014    at [facility]   Family History  Problem Relation Age of Onset  . Cystic fibrosis Mother   . Hypertension Father   . Diabetes Brother   . Hypertension Maternal Grandmother    Social History  Substance Use Topics   . Smoking status: Never Smoker   . Smokeless tobacco: Never Used  . Alcohol Use: No   OB History    No data available      Review of Systems  Constitutional: Negative for fever.  Respiratory: Positive for shortness of breath.   Gastrointestinal: Positive for nausea, vomiting and abdominal pain.  Psychiatric/Behavioral: The patient is nervous/anxious.   All other systems reviewed and are negative.   Allergies  Anesthetics, amide; Penicillins; Buprenorphine hcl; and Encainide  Home Medications   Prior to Admission medications   Medication Sig Start Date End Date Taking? Authorizing Provider  atorvastatin (LIPITOR) 20 MG tablet Take 1 tablet (20 mg total) by mouth daily at 6 PM. 06/25/15   Allie Bossier, MD  Blood Glucose Monitoring Suppl (TRUE METRIX METER) DEVI 1 each by Does not apply route 3 (three) times daily before meals. 07/19/15   [name], MD  busPIRone (BUSPAR) 5 MG tablet Take 1 tablet (5 mg total) by mouth 3 (three) times daily. 07/19/15   [name], MD  dronabinol (MARINOL) 5 MG capsule Take 5 mg by mouth 2 (two) times daily. 05/16/15   Historical Provider, MD  FLUoxetine (PROZAC) 40 MG capsule Take 40 mg by mouth daily.    Historical Provider, MD  glucose blood (TRUE METRIX BLOOD GLUCOSE TEST) test strip Use 3 times daily before meals 07/19/15   [name], MD  insulin aspart (NOVOLOG) 100 UNIT/ML injection Inject 0-15 Units into the skin 3 (three) times daily with meals. 2-15 units three times daily with food. Per sliding scale 06/25/15   Vicente Serene  Aquilla Solian, MD  insulin detemir (LEVEMIR) 100 UNIT/ML injection Inject 0.2 mLs (20 Units total) into the skin daily. 08/01/15   Reyne Dumas, MD  Insulin Syringe-Needle U-100 (BD INSULIN SYRINGE ULTRAFINE) 31G X 15/64" 0.5 ML MISC 1 each by Does not apply route 4 (four) times daily as needed. 08/16/15   [name], MD  metoCLOPramide (REGLAN) 10 MG tablet Take 1 tablet (10 mg total) by mouth every 6 (six) hours as needed for  nausea. 07/03/15   Robbie Lis, MD  ondansetron (ZOFRAN ODT) 4 MG disintegrating tablet Take 1 tablet (4 mg total) by mouth every 8 (eight) hours as needed for nausea. 07/19/15   [name], MD  pantoprazole (PROTONIX) 40 MG tablet Take 40 mg by mouth daily. Reported on 07/19/2015 01/04/15   Historical Provider, MD  polyethylene glycol powder (GLYCOLAX/MIRALAX) powder Take 17 g by mouth daily as needed for moderate constipation. Reported on 07/19/2015 04/06/15   Historical Provider, MD  pregabalin (LYRICA) 150 MG capsule Take 1 capsule (150 mg total) by mouth 2 (two) times daily. 07/19/15   [name], MD  TRUEPLUS LANCETS 28G MISC 1 each by Does not apply route 3 (three) times daily before meals. 07/19/15   [name], MD  vitamin B-12 (CYANOCOBALAMIN) 1000 MCG tablet Take 1 tablet (1,000 mcg total) by mouth daily. 08/16/15   [name], MD   BP 173/84 mmHg  Pulse 121  Temp(Src) 97.5 F (36.4 C) (Axillary)  Resp 26  SpO2 100%   Physical Exam  Constitutional: She is oriented to person, place, and time. She appears well-developed and well-nourished. No distress.  Nontoxic  HENT:  Head: Normocephalic and atraumatic.  Dry mm  Eyes: Conjunctivae and EOM are normal. No scleral icterus.  Neck: Normal range of motion.  No nuchal rigidity or meningismus.  Cardiovascular: Regular rhythm and intact distal pulses.  Tachycardia present.   Pulmonary/Chest: She has no wheezes. She has no rales.  Mild Kussmaul respirations. Lungs CTAB.  Musculoskeletal: Normal range of motion.  Neurological: She is alert and oriented to person, place, and time. She exhibits normal muscle tone. Coordination normal.  Patient moving all extremities.  Skin: Skin is warm and dry. No rash noted. She is not diaphoretic. No erythema. No pallor.  Psychiatric: Her mood appears anxious. She is agitated.  Mildly anxious appearing; this worsened after my presentation to the room  Nursing note and vitals reviewed.   ED  Course  Procedures (including critical care time) Labs Review Labs Reviewed  CBC WITH DIFFERENTIAL/PLATELET - Abnormal; Notable for the following:    WBC 13.5 (*)    Hemoglobin 9.3 (*)    HCT 31.6 (*)    MCV 77.5 (*)    MCH 22.8 (*)    MCHC 29.4 (*)    RDW 16.6 (*)    Platelets 460 (*)    Neutro Abs 12.5 (*)    Lymphs Abs 0.5 (*)    All other components within normal limits  URINALYSIS, ROUTINE W REFLEX MICROSCOPIC (NOT AT [facility]) - Abnormal; Notable for the following:    Glucose, UA >1000 (*)    Hgb urine dipstick SMALL (*)    Ketones, ur >80 (*)    All other components within normal limits  BLOOD GAS, VENOUS - Abnormal; Notable for the following:    pH, Ven 7.307 (*)    pCO2, Ven 24.9 (*)    Bicarbonate 12.1 (*)    Acid-base deficit 12.7 (*)    All other components  within normal limits  URINE MICROSCOPIC-ADD ON - Abnormal; Notable for the following:    Squamous Epithelial / LPF 0-5 (*)    Bacteria, UA RARE (*)    All other components within normal limits  CBG MONITORING, ED - Abnormal; Notable for the following:    Glucose-Capillary 486 (*)    All other components within normal limits  COMPREHENSIVE METABOLIC PANEL  LIPASE, BLOOD  TROPONIN I    Imaging Review No results found.   I have personally reviewed and evaluated these images and lab results as part of my medical decision-making.   EKG Interpretation None       CRITICAL CARE Performed by: Antonietta Breach   Total critical care time: 31 minutes  Critical care time was exclusive of separately billable procedures and treating other patients.  Critical care was necessary to treat or prevent imminent or life-threatening deterioration.  Critical care was time spent personally by me on the following activities: development of treatment plan with patient and/or surrogate as well as nursing, discussions with consultants, evaluation of patient's response to treatment, examination of patient, obtaining history from  patient or surrogate, ordering and performing treatments and interventions, ordering and review of laboratory studies, ordering and review of radiographic studies, pulse oximetry and re-evaluation of patient's condition.  MDM   Final diagnoses:  Diabetic ketoacidosis without coma associated with type 1 diabetes mellitus ([place])    Patient with suspected DKA given hyperglycemia, ketonuria, and acidotic VBG. CMP pending. IVF initiated. Glucose stabilizer held until K+ results. Patient signed out to Lenn Sink, PA-C at shift change. Anticipate admission.   Filed Vitals:   09/02/15 0331 09/02/15 0349 09/02/15 0602  BP:  178/90 173/84  Pulse:  111 121  Temp:  97.5 F (36.4 C)   TempSrc:  Axillary   Resp:  25 26  SpO2: 98% 100% 100%     Antonietta Breach, PA-C 09/02/15 0644  Antonietta Breach, PA-C 09/02/15 0645  Veatrice Kells, MD 09/02/15 [phone]

## 2015-09-02 NOTE — ED Provider Notes (Signed)
50 year old female signed out to me at shift change by Antonietta Breach PA-C pending laboratory analysis.- Please see previous provider's note for full H&P. Patient presents with likely diabetic ketoacidosis, diabetic gastroparesis and chronic abdominal pain. Patient has had 10 admissions in the last 6 months for similar presentations. Patient reports that she is taking her medication, started having nausea and vomiting 2 days prior. Patient most recently discharged from the hospital on 08/01/2015 for similar presentation.   Labs show likely DKA with glucose 531, anion gap of 22. Patient has a normal potassium 4.5.  Glucose stabilizes are started, hospital consult for hospital admission.    [name], PA-C 09/02/15 [id number]  [name], MD 09/02/15 2314

## 2015-09-02 NOTE — ED Notes (Signed)
IV team at bedside 

## 2015-09-02 NOTE — H&P (Signed)
[facility] History and Physical  [name] [id number] DOB: [birth year] DOA: 09/02/2015  Referring physician:  PCP: [name], MD   Chief Complaint: Nausea/vomiting  HPI: [name] is a 50 y.o. female with a past medical history of type 1 diabetes mellitus, gastroparesis, with a history of multiple previous hospitalizations for intractable nausea and vomiting. She was recently admitted to our service from 07/29/2015 through 08/01/2015 at which time she presented with intractable nausea vomiting as well as diabetic ketoacidosis. She presents to the emergency department with complaints of nausea and vomiting over the past 24 hours, reporting inability to hold by mouth down. This is associated with epigastric pain. She denies hematemesis, bloody stools, melena. She also denies fevers chills, chest pain, palpitations syncope. She also states that her blood sugars have been running high, in the 300 range over the past several days. She reports taking her insulin as prescribed without skipping doses. Lab work performed in the emergency department showing a blood sugar of 531 with an anion gap of 22. She was started on IV insulin in the emergency room.                                                  Review of Systems:  Constitutional:  No weight loss, night sweats, Fevers, positive for chills, fatigue.  HEENT:  No headaches, Difficulty swallowing,Tooth/dental problems,Sore throat,  No sneezing, itching, ear ache, nasal congestion, post nasal drip,  Cardio-vascular:  No chest pain, Orthopnea, PND, swelling in lower extremities, anasarca, dizziness, palpitations  GI:  Positive for heartburn, indigestion, abdominal pain, nausea, vomiting, diarrhea, change in bowel habits, loss of appetite  Resp:  No shortness of breath with exertion or at rest. No excess mucus, no productive cough, No non-productive cough, No coughing up of blood.No change in color of mucus.No wheezing.No chest  wall deformity  Skin:  no rash or lesions.  GU:  no dysuria, change in color of urine, no urgency or frequency. No flank pain.  Musculoskeletal:  No joint pain or swelling. No decreased range of motion. No back pain.  Psych:  No change in mood or affect. No depression or anxiety. No memory loss.   Past Medical History  Diagnosis Date  . Diabetes mellitus without complication [facility])    Past Surgical History  Procedure Laterality Date  . Retinal detachment surgery    . Posterior vitrectomy and membrane peel-left eye  09/28/2002  . Posterior vitrectomy and membrane peel-right eye  03/16/2002  . Eye surgery    . Gailstones    . Esophagogastroduodenoscopy  09/27/2014    at [facility], Dr Rolan Lipa. biospy neg for celiac, neg for H pylori.   . Colonoscopy  09/27/2014    at [facility]   Social History:  reports that she has never smoked. She has never used smokeless tobacco. She reports that she does not drink alcohol or use illicit drugs.  Allergies  Allergen Reactions  . Anesthetics, Amide Nausea And Vomiting  . Penicillins Diarrhea and Nausea And Vomiting    Has patient had a PCN reaction causing immediate rash, facial/tongue/throat swelling, SOB or lightheadedness with hypotension: No Has patient had a PCN reaction causing severe rash involving mucus membranes or skin necrosis: No Has patient had a PCN reaction that required hospitalization No Has patient had a PCN reaction occurring within the last 10 years:  Yes  If all of the above answers are "NO", then may proceed with Cephalosporin use.   . Buprenorphine Hcl Rash  . Encainide Nausea And Vomiting    Family History  Problem Relation Age of Onset  . Cystic fibrosis Mother   . Hypertension Father   . Diabetes Brother   . Hypertension Maternal Grandmother      Prior to Admission medications   Medication Sig Start Date End Date Taking? Authorizing Provider  atorvastatin (LIPITOR) 20 MG tablet Take 1 tablet (20 mg total) by mouth  daily at 6 PM. 06/25/15   Allie Bossier, MD  Blood Glucose Monitoring Suppl (TRUE METRIX METER) DEVI 1 each by Does not apply route 3 (three) times daily before meals. 07/19/15   [name], MD  busPIRone (BUSPAR) 5 MG tablet Take 1 tablet (5 mg total) by mouth 3 (three) times daily. 07/19/15   [name], MD  dronabinol (MARINOL) 5 MG capsule Take 5 mg by mouth 2 (two) times daily. 05/16/15   Historical Provider, MD  FLUoxetine (PROZAC) 40 MG capsule Take 40 mg by mouth daily.    Historical Provider, MD  glucose blood (TRUE METRIX BLOOD GLUCOSE TEST) test strip Use 3 times daily before meals 07/19/15   [name], MD  insulin aspart (NOVOLOG) 100 UNIT/ML injection Inject 0-15 Units into the skin 3 (three) times daily with meals. 2-15 units three times daily with food. Per sliding scale 06/25/15   Allie Bossier, MD  insulin detemir (LEVEMIR) 100 UNIT/ML injection Inject 0.2 mLs (20 Units total) into the skin daily. 08/01/15   Reyne Dumas, MD  Insulin Syringe-Needle U-100 (BD INSULIN SYRINGE ULTRAFINE) 31G X 15/64" 0.5 ML MISC 1 each by Does not apply route 4 (four) times daily as needed. 08/16/15   [name], MD  metoCLOPramide (REGLAN) 10 MG tablet Take 1 tablet (10 mg total) by mouth every 6 (six) hours as needed for nausea. 07/03/15   Robbie Lis, MD  ondansetron (ZOFRAN ODT) 4 MG disintegrating tablet Take 1 tablet (4 mg total) by mouth every 8 (eight) hours as needed for nausea. 07/19/15   [name], MD  pantoprazole (PROTONIX) 40 MG tablet Take 40 mg by mouth daily. Reported on 07/19/2015 01/04/15   Historical Provider, MD  polyethylene glycol powder (GLYCOLAX/MIRALAX) powder Take 17 g by mouth daily as needed for moderate constipation. Reported on 07/19/2015 04/06/15   Historical Provider, MD  pregabalin (LYRICA) 150 MG capsule Take 1 capsule (150 mg total) by mouth 2 (two) times daily. 07/19/15   [name], MD  TRUEPLUS LANCETS 28G MISC 1 each by Does not apply route 3 (three) times  daily before meals. 07/19/15   [name], MD  vitamin B-12 (CYANOCOBALAMIN) 1000 MCG tablet Take 1 tablet (1,000 mcg total) by mouth daily. 08/16/15   [name], MD   Physical Exam: Filed Vitals:   09/02/15 0349 09/02/15 0602 09/02/15 0630 09/02/15 0700  BP: 178/90 173/84 165/66 159/81  Pulse: 111 121 115 113  Temp: 97.5 F (36.4 C)     TempSrc: Axillary     Resp: 25 26 23 24   SpO2: 100% 100% 100% 100%    Wt Readings from Last 3 Encounters:  08/16/15 58.605 kg (129 lb 3.2 oz)  07/29/15 50.6 kg (111 lb 8.8 oz)  07/19/15 50.803 kg (112 lb)    General:  Ill-appearing, anxious, stating "Dilaudid helps my stomach pain" Eyes: PERRL, normal lids, irises & conjunctiva ENT: grossly normal hearing, lips &  tongue Neck: no LAD, masses or thyromegaly Cardiovascular: RRR, no m/r/g. No LE edema. Telemetry: SR, no arrhythmias  Respiratory: CTA bilaterally, no w/r/r. Normal respiratory effort. Abdomen: soft, there is mild-to-moderate generalized pain to palpation, no rebound tenderness or guarding no palpable masses Skin: no rash or induration seen on limited exam Musculoskeletal: grossly normal tone BUE/BLE Psychiatric: grossly normal mood and affect, speech fluent and appropriate Neurologic: grossly non-focal.          Labs on Admission:  Basic Metabolic Panel:  Recent Labs Lab 09/02/15 0510  NA 132*  K 4.5  CL 100*  CO2 10*  GLUCOSE 531*  BUN 20  CREATININE 1.27*  CALCIUM 9.7   Liver Function Tests:  Recent Labs Lab 09/02/15 0510  AST 37  ALT 36  ALKPHOS 129*  BILITOT 1.6*  PROT 8.8*  ALBUMIN 4.6    Recent Labs Lab 09/02/15 0510  LIPASE 29   No results for input(s): AMMONIA in the last 168 hours. CBC:  Recent Labs Lab 09/02/15 0510  WBC 13.5*  NEUTROABS 12.5*  HGB 9.3*  HCT 31.6*  MCV 77.5*  PLT 460*   Cardiac Enzymes:  Recent Labs Lab 09/02/15 0520  TROPONINI <0.03    BNP (last 3 results) No results for input(s): BNP in the last 8760  hours.  ProBNP (last 3 results) No results for input(s): PROBNP in the last 8760 hours.  CBG:  Recent Labs Lab 09/02/15 0403  GLUCAP 486*    Radiological Exams on Admission: No results found.  EKG: Independently reviewed.   Assessment/Plan Principal Problem:   DKA (diabetic ketoacidoses) (HCC) Active Problems:   Diabetic neuropathy, type I diabetes mellitus (HCC)   CKD (chronic kidney disease) stage 3, GFR 30-59 ml/min   Diabetic gastroparesis (HCC)   Hyponatremia   1. Diabetic ketoacidosis. Mrs Hanigan having a history of type 1 diabetes mellitus with multiple previous hospitalizations for diabetic ketoacidosis. She presents with complaints of nausea and vomiting reporting elevated blood sugars over the past several days. In the emergency room lab work showed an elevated anion gap of 22 having a blood sugar of 531. She reports compliance to her insulin regimen. Will place her on the DKA protocol, treat with IV insulin, IV fluids, electrolyte replacement, nothing by mouth status, monitor serial BMP's. Will monitor closely in the step down unit. 2. Abdominal pain. She reports severe epigastric abdominal pain I suspect secondary to multiple episodes of nausea and vomiting in setting of diabetic gastroparesis. Lab work showed liver function tests and lipase levels within normal limits. Labs did show an elevated white count of 13,500 although I think this could be secondary to diabetic ketoacidosis. Will provide Protonix 40 mg IV daily, reassess in a.m. 3. Acute kidney injury. Lab work showing elevated creatinine of 1.27 with BUN of 20. Prior to that she had a creatinine of 0.97 and BUN of 13 on 08/01/2015. Likely secondary to prerenal azotemia/hypovolemia in setting of nausea and vomiting along with DKA. Provide IV fluid resuscitation, repeat labs in a.m. 4. Intractable nausea and vomiting. She has a history of diabetic gastroparesis with multiple prior hospitalizations for intractable  nausea and vomiting. I suspect current symptoms are related to combination of DKA and gastroparesis. She had been on Reglan at home. Since she is not tolerating by mouth well provide scheduled IV Reglan. 5. Hyponatremia. Lab work showing a sodium of 132, likely secondary to DKA. 6. Anxiety. Provide 0.5 mg of IV Ativan every 6 hours as needed 7. DVT prophylaxis.  Lovenox   Code Status: Full code Family Communication: Disposition Plan: Will admit to step down unit, dysphagia require greater than 2 nights hospitalization  Time spent: 70 min  Kelvin Cellar [facility] Pager [phone]

## 2015-09-02 NOTE — ED Notes (Signed)
Per EMS patient from home, c/o vomiting.  Per EMS CBG 573.  BP 160/80, HR 116, O2 98 RA.

## 2015-09-03 DIAGNOSIS — E1010 Type 1 diabetes mellitus with ketoacidosis without coma: Principal | ICD-10-CM

## 2015-09-03 DIAGNOSIS — N179 Acute kidney failure, unspecified: Secondary | ICD-10-CM

## 2015-09-03 DIAGNOSIS — E871 Hypo-osmolality and hyponatremia: Secondary | ICD-10-CM

## 2015-09-03 DIAGNOSIS — E872 Acidosis: Secondary | ICD-10-CM | POA: Diagnosis not present

## 2015-09-03 DIAGNOSIS — E1143 Type 2 diabetes mellitus with diabetic autonomic (poly)neuropathy: Secondary | ICD-10-CM | POA: Diagnosis not present

## 2015-09-03 DIAGNOSIS — K3184 Gastroparesis: Secondary | ICD-10-CM

## 2015-09-03 DIAGNOSIS — E876 Hypokalemia: Secondary | ICD-10-CM | POA: Diagnosis not present

## 2015-09-03 DIAGNOSIS — E1043 Type 1 diabetes mellitus with diabetic autonomic (poly)neuropathy: Secondary | ICD-10-CM | POA: Diagnosis not present

## 2015-09-03 DIAGNOSIS — D509 Iron deficiency anemia, unspecified: Secondary | ICD-10-CM | POA: Diagnosis not present

## 2015-09-03 LAB — GLUCOSE, CAPILLARY
GLUCOSE-CAPILLARY: 116 mg/dL — AB (ref 65–99)
GLUCOSE-CAPILLARY: 116 mg/dL — AB (ref 65–99)
GLUCOSE-CAPILLARY: 151 mg/dL — AB (ref 65–99)
GLUCOSE-CAPILLARY: 152 mg/dL — AB (ref 65–99)
GLUCOSE-CAPILLARY: 160 mg/dL — AB (ref 65–99)
GLUCOSE-CAPILLARY: 188 mg/dL — AB (ref 65–99)
GLUCOSE-CAPILLARY: 202 mg/dL — AB (ref 65–99)
GLUCOSE-CAPILLARY: 204 mg/dL — AB (ref 65–99)
GLUCOSE-CAPILLARY: 212 mg/dL — AB (ref 65–99)
GLUCOSE-CAPILLARY: 217 mg/dL — AB (ref 65–99)
GLUCOSE-CAPILLARY: 242 mg/dL — AB (ref 65–99)
GLUCOSE-CAPILLARY: 59 mg/dL — AB (ref 65–99)
GLUCOSE-CAPILLARY: 80 mg/dL (ref 65–99)
Glucose-Capillary: 123 mg/dL — ABNORMAL HIGH (ref 65–99)
Glucose-Capillary: 221 mg/dL — ABNORMAL HIGH (ref 65–99)
Glucose-Capillary: 104 mg/dL — ABNORMAL HIGH (ref 65–99)
Glucose-Capillary: 122 mg/dL — ABNORMAL HIGH (ref 65–99)
Glucose-Capillary: 154 mg/dL — ABNORMAL HIGH (ref 65–99)
Glucose-Capillary: 211 mg/dL — ABNORMAL HIGH (ref 65–99)
Glucose-Capillary: 59 mg/dL — ABNORMAL LOW (ref 65–99)

## 2015-09-03 LAB — BASIC METABOLIC PANEL
ANION GAP: 10 (ref 5–15)
Anion gap: 13 (ref 5–15)
Anion gap: 10 (ref 5–15)
Anion gap: 9 (ref 5–15)
BUN: 15 mg/dL (ref 6–20)
BUN: 15 mg/dL (ref 6–20)
BUN: 15 mg/dL (ref 6–20)
BUN: 12 mg/dL (ref 6–20)
CALCIUM: 9.1 mg/dL (ref 8.9–10.3)
CHLORIDE: 114 mmol/L — AB (ref 101–111)
CHLORIDE: 112 mmol/L — AB (ref 101–111)
CO2: 18 mmol/L — ABNORMAL LOW (ref 22–32)
CO2: 13 mmol/L — AB (ref 22–32)
CO2: 16 mmol/L — ABNORMAL LOW (ref 22–32)
CO2: 20 mmol/L — ABNORMAL LOW (ref 22–32)
CREATININE: 1.04 mg/dL — AB (ref 0.44–1.00)
CREATININE: 0.84 mg/dL (ref 0.44–1.00)
CREATININE: 0.88 mg/dL (ref 0.44–1.00)
Calcium: 9 mg/dL (ref 8.9–10.3)
Calcium: 8.8 mg/dL — ABNORMAL LOW (ref 8.9–10.3)
Calcium: 8.6 mg/dL — ABNORMAL LOW (ref 8.9–10.3)
Chloride: 115 mmol/L — ABNORMAL HIGH (ref 101–111)
Chloride: 108 mmol/L (ref 101–111)
Creatinine, Ser: 1 mg/dL (ref 0.44–1.00)
GFR calc Af Amer: >60 mL/min (ref >60)
GFR calc Af Amer: >60 mL/min (ref >60)
GLUCOSE: 214 mg/dL — AB (ref 65–99)
GLUCOSE: 161 mg/dL — AB (ref 65–99)
GLUCOSE: 181 mg/dL — AB (ref 65–99)
Glucose, Bld: 253 mg/dL — ABNORMAL HIGH (ref 65–99)
POTASSIUM: 4.1 mmol/L (ref 3.5–5.1)
POTASSIUM: 3.6 mmol/L (ref 3.5–5.1)
Potassium: 3.6 mmol/L (ref 3.5–5.1)
Potassium: 4.8 mmol/L (ref 3.5–5.1)
SODIUM: 143 mmol/L (ref 135–145)
SODIUM: 138 mmol/L (ref 135–145)
SODIUM: 137 mmol/L (ref 135–145)
Sodium: 140 mmol/L (ref 135–145)

## 2015-09-03 MED ORDER — SODIUM BICARBONATE 8.4 % IV SOLN
INTRAVENOUS | Status: DC
  Administered 2015-09-03 (×2): via INTRAVENOUS
  Filled 2015-09-03 (×2): qty 150

## 2015-09-03 MED ORDER — MORPHINE SULFATE (PF) 2 MG/ML IV SOLN: 1.0000 mg | INTRAVENOUS | Status: DC | PRN

## 2015-09-03 MED ORDER — INSULIN ASPART 100 UNIT/ML ~~LOC~~ SOLN: 0.0000 [IU] | Freq: Every day | SUBCUTANEOUS | Status: DC

## 2015-09-03 MED ORDER — ACETAMINOPHEN 325 MG PO TABS: 650.0000 mg | ORAL_TABLET | Freq: Four times a day (QID) | ORAL | Status: DC | PRN

## 2015-09-03 MED ORDER — INSULIN DETEMIR 100 UNIT/ML ~~LOC~~ SOLN
20.0000 [IU] | Freq: Every day | SUBCUTANEOUS | Status: DC
  Administered 2015-09-03 – 2015-09-05 (×3): 20 [IU] via SUBCUTANEOUS
  Filled 2015-09-03 (×3): qty 0.2

## 2015-09-03 MED ORDER — INSULIN ASPART 100 UNIT/ML ~~LOC~~ SOLN
0.0000 [IU] | Freq: Three times a day (TID) | SUBCUTANEOUS | Status: DC
  Administered 2015-09-03: 3 [IU] via SUBCUTANEOUS

## 2015-09-03 NOTE — Progress Notes (Signed)
CBG taken at 1555 and was 59.  Retaken to confirm.  Turned off glucose stabilizer and gave patient juice and a snack. CBG at 1619 was 80.  Discussed events with MD.

## 2015-09-03 NOTE — Progress Notes (Signed)
Fredirick Maudlin paged regarding patient most recent labs, per Dr. Algis Liming request. Will continue to monitor.

## 2015-09-03 NOTE — Progress Notes (Signed)
PROGRESS NOTE    [name]  [id number]  DOB: 08-29-1965  DOA: 09/02/2015 PCP: [name], MD Outpatient Specialists:   Hospital course: 50 year old female with history of DM 1 diagnosed at age 71 years, diabetic gastroparesis and multiple prior hospitalizations for intractable nausea and vomiting, recent hospitalization 07/29/15-08/01/15 with DKA, nausea and vomiting, presented to ED with complaints of nausea, nonbloody emesis, inability to keep anything down, epigastric pain and high blood sugars in the 300 range over the past several days. She claimed compliance to her home insulin's. Workup in the ED revealed blood sugar of 531 and anion gap of 22. She was admitted to stepdown unit for management of DKA.  Assessment & Plan:   DKA in DM 1, not at goal - Presented with blood sugar of 531, bicarbonate 10 and anion gap 22. - Treating per DKA protocol with aggressive IV fluids, insulin drip, NPO, frequent BMP monitoring. - Glycemic control has improved, anion gap has normalized but has persistent low bicarbonate of [id number] gap metabolic acidosis at this point. - Change IV fluids to bicarbonate drip and transition patient to Lantus and SSI. - Hemoglobin A1c 07/29/15:8.7  Non-anion gap metabolic acidosis - Unclear etiology but may be due to GI losses versus? RTA - Change IV fluids to bicarbonate drip and monitor BMP closely.  Acute kidney injury - Secondary to dehydration from GI losses and hyperglycemia related polyuria. Resolved after IV fluid hydration.  Hyponatremia  - secondary to dehydration and hypernatremia. Resolved  Diabetic gastroparesis - Improved. Resume diet and monitor closely. Treat supportively.  Anemia - Follow CBCs.    DVT prophylaxis: Lovenox Code Status: Full Family Communication: None at bedside Disposition Plan: Continue management in stepdown unit for additional 24 hours. DC home when medically stable, possibly in 24-48 hours.     Consultants:  None  Procedures:  None  Antimicrobials:  None   Subjective: Overall feels better. No vomiting or BM since yesterday. "Hungry". As per RN, no acute issues.   Objective: Filed Vitals:   09/03/15 0900 09/03/15 1000 09/03/15 1100 09/03/15 1200  BP:  116/49 105/42 106/50  Pulse:  90 90 87  Temp:    97.7 F (36.5 C)  TempSrc:    Oral  Resp: 15 10 14 13   Height:      Weight:      SpO2:  100% 100% 100%    Intake/Output Summary (Last 24 hours) at 09/03/15 1359 Last data filed at 09/03/15 0600  Gross per 24 hour  Intake   1620 ml  Output    800 ml  Net    820 ml   Filed Weights   09/02/15 1630 09/03/15 0430  Weight: 53.7 kg (118 lb 6.2 oz) 56.8 kg (125 lb 3.5 oz)    Exam:  General exam: Pleasant young female lying comfortably supine in bed.  Respiratory system: Clear. No increased work of breathing. Cardiovascular system: S1 & S2 heard, RRR. No JVD, murmurs, gallops, clicks or pedal edema. telemetry: Sinus rhythm.  Gastrointestinal system: Abdomen is nondistended, soft and nontender. Normal bowel sounds heard. Central nervous system: Alert and oriented. No focal neurological deficits. Extremities: Symmetric 5 x 5 power.   Data Reviewed: Basic Metabolic Panel:  Recent Labs Lab 09/02/15 1748 09/02/15 2225 09/03/15 0400 09/03/15 0724 09/03/15 1200  NA 145 144 143 140 138  K 4.1 3.5 3.6 4.8 4.1  CL 119* 118* 115* 114* 112*  CO2 15* 17* 18* 13* 16*  GLUCOSE 188* 130* 214* 161* 253*  BUN 14 14 15 15 15   CREATININE 0.90 0.86 1.00 1.04* 0.84  CALCIUM 9.5 9.2 9.1 9.0 8.8*   Liver Function Tests:  Recent Labs Lab 09/02/15 0510  AST 37  ALT 36  ALKPHOS 129*  BILITOT 1.6*  PROT 8.8*  ALBUMIN 4.6    Recent Labs Lab 09/02/15 0510  LIPASE 29   No results for input(s): AMMONIA in the last 168 hours. CBC:  Recent Labs Lab 09/02/15 0510  WBC 13.5*  NEUTROABS 12.5*  HGB 9.3*  HCT 31.6*  MCV 77.5*  PLT 460*   Cardiac  Enzymes:  Recent Labs Lab 09/02/15 0520  TROPONINI <0.03   BNP (last 3 results) No results for input(s): PROBNP in the last 8760 hours. CBG:  Recent Labs Lab 09/03/15 0802 09/03/15 0907 09/03/15 1019 09/03/15 1246 09/03/15 1346  GLUCAP 154* 212* 211* 217* 188*    Recent Results (from the past 240 hour(s))  MRSA PCR Screening     Status: None   Collection Time: 09/02/15  4:30 PM  Result Value Ref Range Status   MRSA by PCR NEGATIVE NEGATIVE Final    Comment:        The GeneXpert MRSA Assay (FDA approved for NASAL specimens only), is one component of a comprehensive MRSA colonization surveillance program. It is not intended to diagnose MRSA infection nor to guide or monitor treatment for MRSA infections.          Studies: No results found.      Scheduled Meds: . busPIRone  5 mg Oral TID  . enoxaparin (LOVENOX) injection  40 mg Subcutaneous Q24H  . FLUoxetine  40 mg Oral Daily  . metoCLOPramide (REGLAN) injection  10 mg Intravenous TID  . pantoprazole (PROTONIX) IV  40 mg Intravenous Daily  . pregabalin  150 mg Oral BID   Continuous Infusions: . sodium chloride    . dextrose 5 % and 0.45% NaCl 150 mL/hr at 09/03/15 0815  . insulin (NOVOLIN-R) infusion 6.4 Units/hr (09/03/15 1349)    Principal Problem:   DKA (diabetic ketoacidoses) ([place]) Active Problems:   Diabetic neuropathy, type I diabetes mellitus ([place])   CKD (chronic kidney disease) stage 3, GFR 30-59 ml/min   Diabetic gastroparesis (HCC)   Hyponatremia    Time spent: 45 minutes.    Vernell Leep, MD, FACP, FHM. [facility] Pager [phone]  If 7PM-7AM, please contact night-coverage [email or web address] Password [email or web address] 09/03/2015, 1:59 PM    LOS: 1 day

## 2015-09-04 DIAGNOSIS — D509 Iron deficiency anemia, unspecified: Secondary | ICD-10-CM | POA: Diagnosis not present

## 2015-09-04 DIAGNOSIS — E876 Hypokalemia: Secondary | ICD-10-CM | POA: Diagnosis not present

## 2015-09-04 DIAGNOSIS — E1010 Type 1 diabetes mellitus with ketoacidosis without coma: Secondary | ICD-10-CM | POA: Diagnosis not present

## 2015-09-04 DIAGNOSIS — E1043 Type 1 diabetes mellitus with diabetic autonomic (poly)neuropathy: Secondary | ICD-10-CM | POA: Diagnosis not present

## 2015-09-04 LAB — GLUCOSE, CAPILLARY
GLUCOSE-CAPILLARY: 106 mg/dL — AB (ref 65–99)
GLUCOSE-CAPILLARY: 64 mg/dL — AB (ref 65–99)
GLUCOSE-CAPILLARY: 191 mg/dL — AB (ref 65–99)
GLUCOSE-CAPILLARY: 190 mg/dL — AB (ref 65–99)
GLUCOSE-CAPILLARY: 207 mg/dL — AB (ref 65–99)
Glucose-Capillary: 119 mg/dL — ABNORMAL HIGH (ref 65–99)
Glucose-Capillary: 62 mg/dL — ABNORMAL LOW (ref 65–99)
Glucose-Capillary: 112 mg/dL — ABNORMAL HIGH (ref 65–99)

## 2015-09-04 LAB — COMPREHENSIVE METABOLIC PANEL
ALBUMIN: 3 g/dL — AB (ref 3.5–5.0)
ALT: 22 U/L (ref 14–54)
ANION GAP: 7 (ref 5–15)
AST: 24 U/L (ref 15–41)
Alkaline Phosphatase: 76 U/L (ref 38–126)
BILIRUBIN TOTAL: 0.9 mg/dL (ref 0.3–1.2)
BUN: 10 mg/dL (ref 6–20)
CHLORIDE: 108 mmol/L (ref 101–111)
CO2: 26 mmol/L (ref 22–32)
Calcium: 8.5 mg/dL — ABNORMAL LOW (ref 8.9–10.3)
Creatinine, Ser: 0.89 mg/dL (ref 0.44–1.00)
GFR calc Af Amer: >60 mL/min (ref >60)
GFR calc non Af Amer: >60 mL/min (ref >60)
GLUCOSE: 64 mg/dL — AB (ref 65–99)
POTASSIUM: 3 mmol/L — AB (ref 3.5–5.1)
Sodium: 141 mmol/L (ref 135–145)
TOTAL PROTEIN: 5.9 g/dL — AB (ref 6.5–8.1)

## 2015-09-04 LAB — CBC
HEMATOCRIT: 24.6 % — AB (ref 36.0–46.0)
HEMOGLOBIN: 7.4 g/dL — AB (ref 12.0–15.0)
MCH: 22.8 pg — ABNORMAL LOW (ref 26.0–34.0)
MCHC: 30.1 g/dL (ref 30.0–36.0)
MCV: 75.7 fL — AB (ref 78.0–100.0)
Platelets: 366 10*3/uL (ref 150–400)
RBC: 3.25 MIL/uL — ABNORMAL LOW (ref 3.87–5.11)
RDW: 16.6 % — ABNORMAL HIGH (ref 11.5–15.5)
WBC: 6.8 10*3/uL (ref 4.0–10.5)

## 2015-09-04 LAB — RETICULOCYTES
RBC.: 3.75 MIL/uL — AB (ref 3.87–5.11)
RETIC COUNT ABSOLUTE: 63.8 10*3/uL (ref 19.0–186.0)
Retic Ct Pct: 1.7 % (ref 0.4–3.1)

## 2015-09-04 LAB — MAGNESIUM: MAGNESIUM: 1.7 mg/dL (ref 1.7–2.4)

## 2015-09-04 MED ORDER — PANTOPRAZOLE SODIUM 40 MG PO TBEC
40.0000 mg | DELAYED_RELEASE_TABLET | Freq: Every day | ORAL | Status: DC
  Administered 2015-09-05: 40 mg via ORAL
  Filled 2015-09-04: qty 1

## 2015-09-04 MED ORDER — METOCLOPRAMIDE HCL 10 MG PO TABS: 10.0000 mg | ORAL_TABLET | Freq: Four times a day (QID) | ORAL | Status: DC | PRN

## 2015-09-04 MED ORDER — ATORVASTATIN CALCIUM 10 MG PO TABS
20.0000 mg | ORAL_TABLET | Freq: Every day | ORAL | Status: DC
  Administered 2015-09-04 – 2015-09-05 (×2): 20 mg via ORAL
  Filled 2015-09-04 (×2): qty 2

## 2015-09-04 MED ORDER — INSULIN ASPART 100 UNIT/ML ~~LOC~~ SOLN
0.0000 [IU] | Freq: Every day | SUBCUTANEOUS | Status: DC
  Administered 2015-09-04: 2 [IU] via SUBCUTANEOUS

## 2015-09-04 MED ORDER — POTASSIUM CHLORIDE 10 MEQ/100ML IV SOLN: 10.0000 meq | INTRAVENOUS | Status: DC

## 2015-09-04 MED ORDER — POTASSIUM CHLORIDE CRYS ER 20 MEQ PO TBCR
40.0000 meq | EXTENDED_RELEASE_TABLET | ORAL | Status: AC
Start: 2015-09-04 — End: 2015-09-04
  Administered 2015-09-04 (×2): 40 meq via ORAL
  Filled 2015-09-04 (×2): qty 2

## 2015-09-04 MED ORDER — ONDANSETRON 4 MG PO TBDP
4.0000 mg | ORAL_TABLET | Freq: Three times a day (TID) | ORAL | Status: DC | PRN
  Filled 2015-09-04: qty 1

## 2015-09-04 MED ORDER — GLUCERNA SHAKE PO LIQD
237.0000 mL | Freq: Three times a day (TID) | ORAL | Status: DC
  Filled 2015-09-04 (×5): qty 237

## 2015-09-04 MED ORDER — INSULIN ASPART 100 UNIT/ML ~~LOC~~ SOLN
0.0000 [IU] | Freq: Three times a day (TID) | SUBCUTANEOUS | Status: DC
  Administered 2015-09-04 (×2): 2 [IU] via SUBCUTANEOUS
  Administered 2015-09-05: 5 [IU] via SUBCUTANEOUS
  Administered 2015-09-05: 2 [IU] via SUBCUTANEOUS
  Administered 2015-09-05: 7 [IU] via SUBCUTANEOUS

## 2015-09-04 MED ORDER — SODIUM CHLORIDE 0.9 % IV SOLN
INTRAVENOUS | Status: DC
  Administered 2015-09-04: 05:00:00 via INTRAVENOUS

## 2015-09-04 NOTE — Progress Notes (Signed)
Pt transferred from ICU.  Orders read to patient and unit orientation completed

## 2015-09-04 NOTE — Progress Notes (Addendum)
PROGRESS NOTE    [name]  [id number]  DOB: 09-11-1965  DOA: 09/02/2015 PCP: [name], MD Outpatient Specialists:   Hospital course: 50 year old female with history of DM 1 diagnosed at age 75 years, diabetic gastroparesis and multiple prior hospitalizations for intractable nausea and vomiting, recent hospitalization 07/29/15-08/01/15 with DKA, nausea and vomiting, presented to ED with complaints of nausea, nonbloody emesis, inability to keep anything down, epigastric pain and high blood sugars in the 300 range over the past several days. She claimed compliance to her home insulin's. Workup in the ED revealed blood sugar of 531 and anion gap of 22. She was admitted to stepdown unit for management of DKA. DKA resolved. Transfer to medical floor 3/12 and possible discharge home 3/13.  Assessment & Plan:   DKA in DM 1, not at goal - Presented with blood sugar of 531, bicarbonate 10 and anion gap 22. - Treated per DKA protocol with aggressive IV fluids, insulin drip, NPO, frequent BMP monitoring. - Glycemic control improved, anion gap normalized but had persistent low bicarbonate of [id number] gap metabolic acidosis at this point. - transitioned patient to Lantus and SSI. - Hemoglobin A1c 07/29/15:8.7 - Some hypoglycemic episodes. Change sliding scale from moderate to sensitive. Advancing diet. Monitor closely and if has recurrent episodes, may have to cut back on Levemir.  Non-anion gap metabolic acidosis - Unclear etiology but may be due to GI losses versus? RTA - Changed IV fluids to bicarbonate drip and has resolved. Discontinue IV fluids.  Acute kidney injury - Secondary to dehydration from GI losses and hyperglycemia related polyuria. Resolved after IV fluid hydration. Continue to hold lisinopril which was probably being used for nephro protection in the context of long-standing diabetes. Resume lisinopril at discharge.  Hyponatremia  - secondary to dehydration and  hypernatremia. Resolved  Diabetic gastroparesis - Improved. Resume diet and monitor closely. Treat supportively. Tolerating diet.  Iron & B12 deficiency Anemia - On 06/22/15, ferritin 33 and B12: 147. Repeat anemia panel. Consider starting iron supplements and B12 supplements at discharge. Hemoglobin 7.4 probably her baseline. Asymptomatic. - Follow CBC in a.m. and transfuse if hemoglobin less than 7 g per DL.  Hypokalemia - Replace and follow. Magnesium 1.7.     DVT prophylaxis: Lovenox Code Status: Full Family Communication: None at bedside Disposition Plan: Initially admitted to stepdown unit. Transfer to medical floor 3/12 and possible discharge home 3/13.   Consultants:  None  Procedures:  None  Antimicrobials:  None   Subjective: No further nausea, vomiting, diarrhea or abdominal pain. Tolerating diet. Had some hypoglycemic episode this morning, resolved after oral supplements. As per RN, no acute issues.  Objective: Filed Vitals:   09/04/15 0600 09/04/15 0700 09/04/15 0755 09/04/15 0800  BP: 131/60 94/27  148/56  Pulse: 91 92  94  Temp:   98.3 F (36.8 C)   TempSrc:   Oral   Resp:      Height:      Weight:      SpO2: 100% 100%  99%    Intake/Output Summary (Last 24 hours) at 09/04/15 1124 Last data filed at 09/04/15 0600  Gross per 24 hour  Intake 1691.25 ml  Output   1200 ml  Net 491.25 ml   Filed Weights   09/02/15 1630 09/03/15 0430  Weight: 53.7 kg (118 lb 6.2 oz) 56.8 kg (125 lb 3.5 oz)    Exam:  General exam: Pleasant young female sitting up comfortably in bed.  Respiratory system: Clear. No increased  work of breathing. Cardiovascular system: S1 & S2 heard, RRR. No JVD, murmurs, gallops, clicks or pedal edema. Telemetry: Sinus rhythm.  Gastrointestinal system: Abdomen is nondistended, soft and nontender. Normal bowel sounds heard. Central nervous system: Alert and oriented. No focal neurological deficits. Extremities: Symmetric 5 x 5  power.   Data Reviewed: Basic Metabolic Panel:  Recent Labs Lab 09/03/15 0400 09/03/15 0724 09/03/15 1200 09/03/15 1948 09/04/15 0426  NA 143 140 138 137 141  K 3.6 4.8 4.1 3.6 3.0*  CL 115* 114* 112* 108 108  CO2 18* 13* 16* 20* 26  GLUCOSE 214* 161* 253* 181* 64*  BUN 15 15 15 12 10   CREATININE 1.00 1.04* 0.84 0.88 0.89  CALCIUM 9.1 9.0 8.8* 8.6* 8.5*  MG  --   --   --   --  1.7   Liver Function Tests:  Recent Labs Lab 09/02/15 0510 09/04/15 0426  AST 37 24  ALT 36 22  ALKPHOS 129* 76  BILITOT 1.6* 0.9  PROT 8.8* 5.9*  ALBUMIN 4.6 3.0*    Recent Labs Lab 09/02/15 0510  LIPASE 29   No results for input(s): AMMONIA in the last 168 hours. CBC:  Recent Labs Lab 09/02/15 0510 09/04/15 0426  WBC 13.5* 6.8  NEUTROABS 12.5*  --   HGB 9.3* 7.4*  HCT 31.6* 24.6*  MCV 77.5* 75.7*  PLT 460* 366   Cardiac Enzymes:  Recent Labs Lab 09/02/15 0520  TROPONINI <0.03   BNP (last 3 results) No results for input(s): PROBNP in the last 8760 hours. CBG:  Recent Labs Lab 09/03/15 2207 09/04/15 0531 09/04/15 0556 09/04/15 0622 09/04/15 0731  GLUCAP 119* 62* 64* 106* 191*    Recent Results (from the past 240 hour(s))  MRSA PCR Screening     Status: None   Collection Time: 09/02/15  4:30 PM  Result Value Ref Range Status   MRSA by PCR NEGATIVE NEGATIVE Final    Comment:        The GeneXpert MRSA Assay (FDA approved for NASAL specimens only), is one component of a comprehensive MRSA colonization surveillance program. It is not intended to diagnose MRSA infection nor to guide or monitor treatment for MRSA infections.          Studies: No results found.      Scheduled Meds: . atorvastatin  20 mg Oral q1800  . busPIRone  5 mg Oral TID  . enoxaparin (LOVENOX) injection  40 mg Subcutaneous Q24H  . FLUoxetine  40 mg Oral Daily  . insulin aspart  0-5 Units Subcutaneous QHS  . insulin aspart  0-9 Units Subcutaneous TID WC  . insulin  detemir  20 Units Subcutaneous Daily  . [START ON 09/05/2015] pantoprazole  40 mg Oral Daily  . potassium chloride  40 mEq Oral Q4H  . pregabalin  150 mg Oral BID   Continuous Infusions:    Principal Problem:   DKA (diabetic ketoacidoses) (HCC) Active Problems:   Diabetic neuropathy, type I diabetes mellitus (HCC)   CKD (chronic kidney disease) stage 3, GFR 30-59 ml/min   Diabetic gastroparesis (HCC)   Hyponatremia    Time spent: 25 minutes.    Vernell Leep, MD, FACP, FHM. [facility] Pager [phone]  If 7PM-7AM, please contact night-coverage [email or web address] Password [email or web address] 09/04/2015, 11:24 AM    LOS: 2 days

## 2015-09-04 NOTE — Progress Notes (Addendum)
Random CBG check at 0535 of 62. Juice, graham crackers, and peanut butter given to patient. Will recheck CBG in 15 minutes. Will continue to monitor.  CBG rechecked at 0555, 64. Patient given another snack of juice and graham crackers. Will recheck CBG in 15 minutes. Will continue to monitor.  CBG rechecked at 0620, 106. Will continue to monitor.

## 2015-09-04 NOTE — Progress Notes (Signed)
Initial Nutrition Assessment  INTERVENTION:   -Provide Glucerna Shake po TID, each supplement provides 220 kcal and 10 grams of protein -Encourage PO intake -RD to continue to monitor  NUTRITION DIAGNOSIS:   Inadequate oral intake related to nausea, vomiting (gastroparesis) as evidenced by per patient/family report.  GOAL:   Patient will meet greater than or equal to 90% of their needs  MONITOR:   PO intake, Supplement acceptance, Labs, Weight trends, I & O's  REASON FOR ASSESSMENT:   Malnutrition Screening Tool    ASSESSMENT:   50 year old female with history of DM 1 diagnosed at age 84 years, diabetic gastroparesis and multiple prior hospitalizations for intractable nausea and vomiting, recent hospitalization 07/29/15-08/01/15 with DKA, nausea and vomiting, presented to ED with complaints of nausea, nonbloody emesis, inability to keep anything down, epigastric pain and high blood sugars in the 300 range over the past several days. She claimed compliance to her home insulin's. Workup in the ED revealed blood sugar of 531 and anion gap of 22. She was admitted to stepdown unit for management of DKA. DKA resolved.   Patient in room with no family at bedside. Pt reports improved appetite, states her weight tends to fluctuate as her gastroparesis symptoms flare up. Pt has had multiple encounters with RDs for diet education in previous admissions. Pt does feel hungry, plans to order lunch soon. Pt has been receiving Reglan which has helped. Pt ate omelette, grits, bacon and coffee this morning and is tolerating with no issue. Pt is agreeable to receiving Glucerna shakes, will order TID. Nutrition-Focused physical exam completed. Findings are no fat depletion, mild-moderate muscle depletion, and no edema. Pt reports weakness in legs.  Medications reviewed.  Labs reviewed: CBGs: 64-191 Low K Mg WNL  Diet Order:  Diet Carb Modified Fluid consistency:: Thin; Room service appropriate?:  Yes  Skin:  Reviewed, no issues  Last BM:  PTA  Height:   Ht Readings from Last 1 Encounters:  09/02/15 5\' 1"  (1.549 m)    Weight:   Wt Readings from Last 1 Encounters:  09/03/15 125 lb 3.5 oz (56.8 kg)    Ideal Body Weight:  47.7 kg  BMI:  Body mass index is 23.67 kg/(m^2).  Estimated Nutritional Needs:   Kcal:  1500-1700  Protein:  70-80g  Fluid:  1.8L/day  EDUCATION NEEDS:   No education needs identified at this time  Clayton Bibles, MS, RD, LDN Pager: [phone] After Hours Pager: [phone]

## 2015-09-05 DIAGNOSIS — E872 Acidosis, unspecified: Secondary | ICD-10-CM | POA: Insufficient documentation

## 2015-09-05 DIAGNOSIS — N179 Acute kidney failure, unspecified: Secondary | ICD-10-CM | POA: Diagnosis not present

## 2015-09-05 DIAGNOSIS — E1010 Type 1 diabetes mellitus with ketoacidosis without coma: Secondary | ICD-10-CM | POA: Insufficient documentation

## 2015-09-05 DIAGNOSIS — E1143 Type 2 diabetes mellitus with diabetic autonomic (poly)neuropathy: Secondary | ICD-10-CM | POA: Diagnosis not present

## 2015-09-05 DIAGNOSIS — D509 Iron deficiency anemia, unspecified: Secondary | ICD-10-CM | POA: Insufficient documentation

## 2015-09-05 LAB — GLUCOSE, CAPILLARY
GLUCOSE-CAPILLARY: 116 mg/dL — AB (ref 65–99)
GLUCOSE-CAPILLARY: 311 mg/dL — AB (ref 65–99)
GLUCOSE-CAPILLARY: 251 mg/dL — AB (ref 65–99)
Glucose-Capillary: 151 mg/dL — ABNORMAL HIGH (ref 65–99)

## 2015-09-05 LAB — VITAMIN B12: Vitamin B-12: 269 pg/mL (ref 180–914)

## 2015-09-05 LAB — BASIC METABOLIC PANEL
Anion gap: 7 (ref 5–15)
BUN: 11 mg/dL (ref 6–20)
CALCIUM: 8.6 mg/dL — AB (ref 8.9–10.3)
CO2: 25 mmol/L (ref 22–32)
CREATININE: 0.9 mg/dL (ref 0.44–1.00)
Chloride: 108 mmol/L (ref 101–111)
Glucose, Bld: 98 mg/dL (ref 65–99)
Potassium: 3.9 mmol/L (ref 3.5–5.1)
SODIUM: 140 mmol/L (ref 135–145)

## 2015-09-05 LAB — CBC
HCT: 24 % — ABNORMAL LOW (ref 36.0–46.0)
Hemoglobin: 7 g/dL — ABNORMAL LOW (ref 12.0–15.0)
MCH: 22.6 pg — ABNORMAL LOW (ref 26.0–34.0)
MCHC: 29.2 g/dL — ABNORMAL LOW (ref 30.0–36.0)
MCV: 77.4 fL — AB (ref 78.0–100.0)
PLATELETS: 315 10*3/uL (ref 150–400)
RBC: 3.1 MIL/uL — AB (ref 3.87–5.11)
RDW: 16.5 % — AB (ref 11.5–15.5)
WBC: 5.2 10*3/uL (ref 4.0–10.5)

## 2015-09-05 LAB — PREPARE RBC (CROSSMATCH)

## 2015-09-05 LAB — IRON AND TIBC
Iron: 9 ug/dL — ABNORMAL LOW (ref 28–170)
SATURATION RATIOS: 2 % — AB (ref 10.4–31.8)
TIBC: 462 ug/dL — AB (ref 250–450)
UIBC: 453 ug/dL

## 2015-09-05 LAB — FOLATE: Folate: 15.6 ng/mL (ref >5.9)

## 2015-09-05 LAB — FERRITIN: FERRITIN: 7 ng/mL — AB (ref 11–307)

## 2015-09-05 MED ORDER — GLUCERNA SHAKE PO LIQD: 237.0000 mL | Freq: Three times a day (TID) | ORAL | Status: DC

## 2015-09-05 MED ORDER — DOCUSATE SODIUM 100 MG PO CAPS
100.0000 mg | ORAL_CAPSULE | Freq: Two times a day (BID) | ORAL | Status: DC
  Administered 2015-09-05: 100 mg via ORAL
  Filled 2015-09-05: qty 1

## 2015-09-05 MED ORDER — FERROUS FUMARATE 324 (106 FE) MG PO TABS: 1.0000 | ORAL_TABLET | Freq: Two times a day (BID) | ORAL | Status: DC

## 2015-09-05 MED ORDER — SODIUM CHLORIDE 0.9 % IV SOLN
Freq: Once | INTRAVENOUS | Status: AC
  Administered 2015-09-05: 13:00:00 via INTRAVENOUS

## 2015-09-05 MED ORDER — FERROUS FUMARATE 324 (106 FE) MG PO TABS
1.0000 | ORAL_TABLET | Freq: Two times a day (BID) | ORAL | Status: DC
  Administered 2015-09-05: 106 mg via ORAL
  Filled 2015-09-05 (×2): qty 1

## 2015-09-05 MED ORDER — DOCUSATE SODIUM 100 MG PO CAPS: 100.0000 mg | ORAL_CAPSULE | Freq: Two times a day (BID) | ORAL | Status: DC

## 2015-09-05 NOTE — Discharge Summary (Addendum)
Physician Discharge Summary  [name]  [id number]  DOB: May 25, 1966  DOA: 09/02/2015  PCP: [name], MD  Admit date: 09/02/2015 Discharge date: 09/05/2015  Time spent: Greater than 30 minutes  Recommendations for Outpatient Follow-up:  1. Dr. [name], PCP on 09/20/15 at 2 PM. To be seen with repeat labs (CBC & BMP). 2. Recommend outpatient referral to Endocrinology for further evaluation and management of poorly controlled type I DM/possible brittle DM. 3. Recommend outpatient referral to GYN for further evaluation and management of menorrhagia.  Discharge Diagnoses:  Principal Problem:   DKA (diabetic ketoacidoses) ([place]) Active Problems:   Diabetic neuropathy, type I diabetes mellitus ([facility])   CKD (chronic kidney disease) stage 3, GFR 30-59 ml/min   Diabetic gastroparesis ([facility])   Hyponatremia   Metabolic acidosis, normal anion gap (NAG)   AKI (acute kidney injury) (H[facility])   Anemia, iron deficiency   DKA, type 1, not at goal [facility])   Discharge Condition: Improved & Stable  Diet recommendation: Heart healthy and diabetic diet.  Filed Weights   09/02/15 1630 09/03/15 0430  Weight: 53.7 kg (118 lb 6.2 oz) 56.8 kg (125 lb 3.5 oz)    History of present illness:  50 year old female with history of DM 1 diagnosed at age 37 years, diabetic gastroparesis and multiple prior hospitalizations for intractable nausea and vomiting, recent hospitalization 07/29/15-08/01/15 with DKA, nausea and vomiting, presented to ED with complaints of nausea, nonbloody emesis, inability to keep anything down, epigastric pain and high blood sugars in the 300 range over the past several days. She claimed compliance to her home insulin's. Workup in the ED revealed blood sugar of 531 and anion gap of 22. She was admitted to stepdown unit for management of DKA. DKA resolved.   Hospital Course:   DKA in DM 1, not at goal - Presented with blood sugar of 531, bicarbonate 10 and anion gap 22. - Treated  per DKA protocol with aggressive IV fluids, insulin drip, NPO, frequent BMP monitoring. - Glycemic control improved, anion gap normalized but had persistent low bicarbonate of [id number] gap metabolic acidosis at this point. - transitioned patient to Lantus and SSI. - Hemoglobin A1c 07/29/15:8.7 - Patient was placed on home dose of Levemir and sliding scale insulin was changed from moderate to sensitive scale due to episode of hypoglycemia. - CBGs are better but fluctuating 116-311 range. Discussed extensively with patient when indicates that even at home her CBGs fluctuate mostly in the 200s but occasionally (up to 2-3 times a month) may have hypoglycemic episodes up to 2s. - Continue prior dose of home insulin's and advised patient to closely monitor and recommend outpatient endocrinology consultation for further evaluation and management of possible brittle DM. She verbalized understanding.  Non-anion gap metabolic acidosis - Unclear etiology but may be due to GI losses versus? RTA - Changed IV fluids to bicarbonate drip and resolved.   Acute kidney injury - Secondary to dehydration from GI losses and hyperglycemia related polyuria. Resolved after IV fluid hydration. Continue to hold lisinopril which was probably being used for nephro protection in the context of long-standing diabetes. Resumed lisinopril at discharge.  Hyponatremia  - secondary to dehydration and hypernatremia. Resolved  Diabetic gastroparesis - Improved. Resume diet and monitor closely. Treated supportively. Tolerating diet.  Iron deficiency Anemia - On 06/22/15, ferritin 33 and B12: 147. Repeat anemia panel. Consider starting iron supplements and B12 supplements at discharge. Hemoglobin 7.4 probably her baseline. Asymptomatic. - Repeat anemia panel 09/04/15: Iron 9, TIBC 462,  saturation ratios 2, ferritin 7, folate 15.6 and B12: 269. - Hemoglobin: 7. Patient does complain of feeling fatigued and weak. Agreeable to blood  transfusion and hence patient was transfused 1 unit of PRBCs and Hb improved to 9.1. - Etiology likely secondary to menorrhagia. States that she has excess menstrual bleeding and at times with clots. Recommend outpatient GYN consultation for definitive treatment. LMP approximately a week ago. - She used to be on iron supplements but stopped taking it greater than a year ago because of constipation. Advised compliance with iron supplements and added stool softeners to minimize side effects. She verbalized understanding.  Hypokalemia - Replaced. Magnesium 1.7.     Consultants:  None  Procedures:  None  Antimicrobials:  None  Discharge Exam:  Complaints: Patient complained of some generalized weakness/fatigue which she's had even prior to admission. Gives history of menorrhagia. Felt better after 1 unit PRBC transfusion.  Filed Vitals:   09/05/15 0442 09/05/15 1137 09/05/15 1200 09/05/15 1355  BP: 112/47 150/75 161/78 138/70  Pulse: 86 98 94 100  Temp: 98.3 F (36.8 C)  97.9 F (36.6 C) 98.3 F (36.8 C)  TempSrc: Oral Oral Oral Oral  Resp: 16 16 16    Height:      Weight:      SpO2: 99% 100% 100% 98%    General exam: Pleasant young female sitting up comfortably in bed eating breakfast this morning.  Respiratory system: Clear. No increased work of breathing. Cardiovascular system: S1 & S2 heard, RRR. No JVD, murmurs, gallops, clicks or pedal edema.   Gastrointestinal system: Abdomen is nondistended, soft and nontender. Normal bowel sounds heard. Central nervous system: Alert and oriented. No focal neurological deficits. Extremities: Symmetric 5 x 5 power.  Discharge Instructions      Discharge Instructions    Call MD for:  difficulty breathing, headache or visual disturbances    Complete by:  As directed      Call MD for:  extreme fatigue    Complete by:  As directed      Call MD for:  persistant dizziness or light-headedness    Complete by:  As directed       Call MD for:  persistant nausea and vomiting    Complete by:  As directed      Call MD for:  severe uncontrolled pain    Complete by:  As directed      Call MD for:  temperature >100.4    Complete by:  As directed      Diet - low sodium heart healthy    Complete by:  As directed      Diet Carb Modified    Complete by:  As directed      Increase activity slowly    Complete by:  As directed             Medication List    STOP taking these medications        dronabinol 5 MG capsule  Commonly known as:  MARINOL     metoCLOPramide 10 MG tablet  Commonly known as:  REGLAN      TAKE these medications        atorvastatin 20 MG tablet  Commonly known as:  LIPITOR  Take 1 tablet (20 mg total) by mouth daily at 6 PM.     busPIRone 5 MG tablet  Commonly known as:  BUSPAR  Take 1 tablet (5 mg total) by mouth 3 (three) times daily.  docusate sodium 100 MG capsule  Commonly known as:  COLACE  Take 1 capsule (100 mg total) by mouth 2 (two) times daily.     feeding supplement (GLUCERNA SHAKE) Liqd  Take 237 mLs by mouth 3 (three) times daily between meals.     Ferrous Fumarate 324 (106 Fe) MG Tabs tablet  Commonly known as:  HEMOCYTE - 106 mg FE  Take 1 tablet (106 mg of iron total) by mouth 2 (two) times daily.     FLUoxetine 40 MG capsule  Commonly known as:  PROZAC  Take 40 mg by mouth daily.     glucose blood test strip  Commonly known as:  TRUE METRIX BLOOD GLUCOSE TEST  Use 3 times daily before meals     insulin aspart 100 UNIT/ML injection  Commonly known as:  novoLOG  Inject 0-15 Units into the skin 3 (three) times daily with meals. 2-15 units three times daily with food. Per sliding scale     insulin detemir 100 UNIT/ML injection  Commonly known as:  LEVEMIR  Inject 0.2 mLs (20 Units total) into the skin daily.     Insulin Syringe-Needle U-100 31G X 15/64" 0.5 ML Misc  Commonly known as:  BD INSULIN SYRINGE ULTRAFINE  1 each by Does not apply route 4  (four) times daily as needed.     lisinopril 5 MG tablet  Commonly known as:  PRINIVIL,ZESTRIL  Take 5 mg by mouth daily.     ondansetron 4 MG disintegrating tablet  Commonly known as:  ZOFRAN ODT  Take 1 tablet (4 mg total) by mouth every 8 (eight) hours as needed for nausea.     pantoprazole 40 MG tablet  Commonly known as:  PROTONIX  Take 40 mg by mouth daily. Reported on 07/19/2015     polyethylene glycol powder powder  Commonly known as:  GLYCOLAX/MIRALAX  Take 17 g by mouth daily as needed for moderate constipation. Reported on 07/19/2015     pregabalin 150 MG capsule  Commonly known as:  LYRICA  Take 1 capsule (150 mg total) by mouth 2 (two) times daily.     TRUE METRIX METER Devi  1 each by Does not apply route 3 (three) times daily before meals.     TRUEPLUS LANCETS 28G Misc  1 each by Does not apply route 3 (three) times daily before meals.       Follow-up Information    Follow up with [facility] On 09/20/2015.   Why:  Appointment on 09/20/15 at 2:00 pm with Dr. Jarold Song. To be seen with repeat labs (CBC & BMP).   Contact information:   [address] [place] [phone]      Get Medicines reviewed and adjusted: Please take all your medications with you for your next visit with your Primary MD  Please request your Primary MD to go over all hospital tests and procedure/radiological results at the follow up. Please ask your Primary MD to get all Hospital records sent to his/her office.  If you experience worsening of your admission symptoms, develop shortness of breath, life threatening emergency, suicidal or homicidal thoughts you must seek medical attention immediately by calling 911 or calling your MD immediately if symptoms less severe.  You must read complete instructions/literature along with all the possible adverse reactions/side effects for all the Medicines you take and that have been prescribed  to you. Take any new Medicines after you have completely understood and  accept all the possible adverse reactions/side effects.   Do not drive when taking pain medications.   Do not take more than prescribed Pain, Sleep and Anxiety Medications  Special Instructions: If you have smoked or chewed Tobacco in the last 2 yrs please stop smoking, stop any regular Alcohol and or any Recreational drug use.  Wear Seat belts while driving.  Please note  You were cared for by a hospitalist during your hospital stay. Once you are discharged, your primary care physician will handle any further medical issues. Please note that NO REFILLS for any discharge medications will be authorized once you are discharged, as it is imperative that you return to your primary care physician (or establish a relationship with a primary care physician if you do not have one) for your aftercare needs so that they can reassess your need for medications and monitor your lab values.    The results of significant diagnostics from this hospitalization (including imaging, microbiology, ancillary and laboratory) are listed below for reference.    Significant Diagnostic Studies: No results found.  Microbiology: Recent Results (from the past 240 hour(s))  MRSA PCR Screening     Status: None   Collection Time: 09/02/15  4:30 PM  Result Value Ref Range Status   MRSA by PCR NEGATIVE NEGATIVE Final    Comment:        The GeneXpert MRSA Assay (FDA approved for NASAL specimens only), is one component of a comprehensive MRSA colonization surveillance program. It is not intended to diagnose MRSA infection nor to guide or monitor treatment for MRSA infections.      Labs: Basic Metabolic Panel:  Recent Labs Lab 09/03/15 0724 09/03/15 1200 09/03/15 1948 09/04/15 0426 09/05/15 0339  NA 140 138 137 141 140  K 4.8 4.1 3.6 3.0* 3.9  CL 114* 112* 108 108 108  CO2 13* 16* 20* 26 25  GLUCOSE 161* 253* 181* 64* 98  BUN  15 15 12 10 11   CREATININE 1.04* 0.84 0.88 0.89 0.90  CALCIUM 9.0 8.8* 8.6* 8.5* 8.6*  MG  --   --   --  1.7  --    Liver Function Tests:  Recent Labs Lab 09/02/15 0510 09/04/15 0426  AST 37 24  ALT 36 22  ALKPHOS 129* 76  BILITOT 1.6* 0.9  PROT 8.8* 5.9*  ALBUMIN 4.6 3.0*    Recent Labs Lab 09/02/15 0510  LIPASE 29   No results for input(s): AMMONIA in the last 168 hours. CBC:  Recent Labs Lab 09/02/15 0510 09/04/15 0426 09/05/15 0339 09/05/15 1643  WBC 13.5* 6.8 5.2 6.1  NEUTROABS 12.5*  --   --  3.5  HGB 9.3* 7.4* 7.0* 9.1*  HCT 31.6* 24.6* 24.0* 28.6*  MCV 77.5* 75.7* 77.4* 73.5*  PLT 460* 366 315 338   Cardiac Enzymes:  Recent Labs Lab 09/02/15 0520  TROPONINI <0.03   BNP: BNP (last 3 results) No results for input(s): BNP in the last 8760 hours.  ProBNP (last 3 results) No results for input(s): PROBNP in the last 8760 hours.  CBG:  Recent Labs Lab 09/04/15 2225 09/05/15 0413 09/05/15 0814 09/05/15 1201 09/05/15 1716  GLUCAP 207* 116* 311* 251* 151*       Signed:  Vernell Leep, MD, FACP, FHM. [facility] Pager [phone]  If 7PM-7AM, please contact night-coverage [email or web address] Password [facility] 09/05/2015, 5:55 PM

## 2015-09-05 NOTE — Discharge Instructions (Signed)
Diabetic Ketoacidosis Diabetic ketoacidosis is a life-threatening complication of diabetes. If it is not treated, it can cause severe dehydration and organ damage and can lead to a coma or death. CAUSES This condition develops when there is not enough of the hormone insulin in the body. Insulin helps the body to break down sugar for energy. Without insulin, the body cannot break down sugar, so it breaks down fats instead. This leads to the production of acids that are called ketones. Ketones are poisonous at high levels. This condition can be triggered by:  Stress on the body that is brought on by an illness.  Medicines that raise blood glucose levels.  Not taking diabetes medicine. SYMPTOMS Symptoms of this condition include:  Fatigue.  Weight loss.  Excessive thirst.  Light-headedness.  Fruity or sweet-smelling breath.  Excessive urination.  Vision changes.  Confusion or irritability.  Nausea.  Vomiting.  Rapid breathing.  Abdominal pain.  Feeling flushed. DIAGNOSIS This condition is diagnosed based on a medical history, a physical exam, and blood tests. You may also have a urine test that checks for ketones. TREATMENT This condition may be treated with:  Fluid replacement. This may be done to correct dehydration.  Insulin injections. These may be given through the skin or through an IV tube.  Electrolyte replacement. Electrolytes, such as potassium and sodium, may be given in pill form or through an IV tube.  Antibiotic medicines. These may be prescribed if your condition was caused by an infection. HOME CARE INSTRUCTIONS Eating and Drinking  Drink enough fluids to keep your urine clear or pale yellow.  If you cannot eat, alternate between drinking fluids with sugar (such as juice) and salty fluids (such as broth or bouillon).  If you can eat, follow your usual diet and drink sugar-free liquids, such as water. Other Instructions  Take insulin as  directed by your health care provider. Do not skip insulin injections. Do not use expired insulin.  If your blood sugar is over 240 mg/dL, monitor your urine ketones every 4-6 hours.  If you were prescribed an antibiotic medicine, finish all of it even if you start to feel better.  Rest and exercise only as directed by your health care provider.  If you get sick, call your health care provider and begin treatment quickly. Your body often needs extra insulin to fight an illness.  Check your blood glucose levels regularly. If your blood glucose is high, drink plenty of fluids. This helps to flush out ketones. SEEK MEDICAL CARE IF:  Your blood glucose level is too high or too low.  You have ketones in your urine.  You have a fever.  You cannot eat.  You cannot tolerate fluids.  You have been vomiting for more than 2 hours.  You continue to have symptoms of this condition.  You develop new symptoms. SEEK IMMEDIATE MEDICAL CARE IF:  Your blood glucose levels continue to be high (elevated).  Your monitor reads "high" even when you are taking insulin.  You faint.  You have chest pain.  You have trouble breathing.  You have a sudden, severe headache.  You have sudden weakness in one arm or one leg.  You have sudden trouble speaking or swallowing.  You have vomiting or diarrhea that gets worse after 3 hours.  You feel severely fatigued.  You have trouble thinking.  You have abdominal pain.  You are severely dehydrated. Symptoms of severe dehydration include:  Extreme thirst.  Dry mouth.  Blue lips.  Cold hands and feet.  Rapid breathing.   This information is not intended to replace advice given to you by your health care provider. Make sure you discuss any questions you have with your health care provider.   Document Released: 06/08/2000 Document Revised: 10/26/2014 Document Reviewed: 05/19/2014 Elsevier Interactive Patient Education 2016 [employer].  Iron Deficiency Anemia, Adult Anemia is a condition in which there are less red blood cells or hemoglobin in the blood than normal. Hemoglobin is the part of red blood cells that carries oxygen. Iron deficiency anemia is anemia caused by too little iron. It is the most common type of anemia. It may leave you tired and short of breath. CAUSES   Lack of iron in the diet.  Poor absorption of iron, as seen with intestinal disorders.  Intestinal bleeding.  Heavy periods. SIGNS AND SYMPTOMS  Mild anemia may not be noticeable. Symptoms may include:  Fatigue.  Headache.  Pale skin.  Weakness.  Tiredness.  Shortness of breath.  Dizziness.  Cold hands and feet.  Fast or irregular heartbeat. DIAGNOSIS  Diagnosis requires a thorough evaluation and physical exam by your health care provider. Blood tests are generally used to confirm iron deficiency anemia. Additional tests may be done to find the underlying cause of your anemia. These may include:  Testing for blood in the stool (fecal occult blood test).  A procedure to see inside the colon and rectum (colonoscopy).  A procedure to see inside the esophagus and stomach (endoscopy). TREATMENT  Iron deficiency anemia is treated by correcting the cause of the deficiency. Treatment may involve:  Adding iron-rich foods to your diet.  Taking iron supplements. Pregnant or breastfeeding women need to take extra iron because their normal diet usually does not provide the required amount.  Taking vitamins. Vitamin C improves the absorption of iron. Your health care provider may recommend that you take your iron tablets with a glass of orange juice or vitamin C supplement.  Medicines to make heavy menstrual flow lighter.  Surgery. HOME CARE INSTRUCTIONS   Take iron as directed by your health care provider.  If you cannot tolerate taking iron supplements by mouth, talk to your health care provider about taking them through a  vein (intravenously) or an injection into a muscle.  For the best iron absorption, iron supplements should be taken on an empty stomach. If you cannot tolerate them on an empty stomach, you may need to take them with food.  Do not drink milk or take antacids at the same time as your iron supplements. Milk and antacids may interfere with the absorption of iron.  Iron supplements can cause constipation. Make sure to include fiber in your diet to prevent constipation. A stool softener may also be recommended.  Take vitamins as directed by your health care provider.  Eat a diet rich in iron. Foods high in iron include liver, lean beef, whole-grain bread, eggs, dried fruit, and dark green leafy vegetables. SEEK IMMEDIATE MEDICAL CARE IF:   You faint. If this happens, do not drive. Call your local emergency services (911 in [place] if no other help is available.  You have chest pain.  You feel nauseous or vomit.  You have severe or increased shortness of breath with activity.  You feel weak.  You have a rapid heartbeat.  You have unexplained sweating.  You become light-headed when getting up from a chair or bed. MAKE SURE YOU:   Understand these instructions.  Will watch your condition.  Will get help right away if you are not doing well or get worse.   This information is not intended to replace advice given to you by your health care provider. Make sure you discuss any questions you have with your health care provider.   Document Released: 06/08/2000 Document Revised: 07/02/2014 Document Reviewed: 02/16/2013 Elsevier Interactive Patient Education [employer].

## 2015-09-05 NOTE — Progress Notes (Signed)
Discharge instructions reviewed with patient, questions answered, verbalized understanding.   

## 2015-09-06 ENCOUNTER — Telehealth: Payer: Self-pay

## 2015-09-06 LAB — CBC WITH DIFFERENTIAL/PLATELET
BASOS ABS: 0 10*3/uL (ref 0.0–0.1)
BASOS PCT: 1 %
EOS ABS: 0.2 10*3/uL (ref 0.0–0.7)
EOS PCT: 3 %
HCT: 28.6 % — ABNORMAL LOW (ref 36.0–46.0)
Hemoglobin: 9.1 g/dL — ABNORMAL LOW (ref 12.0–15.0)
LYMPHS PCT: 28 %
Lymphs Abs: 1.7 10*3/uL (ref 0.7–4.0)
MCH: 23.4 pg — ABNORMAL LOW (ref 26.0–34.0)
MCHC: 31.8 g/dL (ref 30.0–36.0)
MCV: 73.5 fL — AB (ref 78.0–100.0)
MONO ABS: 0.7 10*3/uL (ref 0.1–1.0)
Monocytes Relative: 12 %
Neutro Abs: 3.5 10*3/uL (ref 1.7–7.7)
Neutrophils Relative %: 57 %
PLATELETS: 338 10*3/uL (ref 150–400)
RBC: 3.89 MIL/uL (ref 3.87–5.11)
RDW: 16.1 % — AB (ref 11.5–15.5)
WBC: 6.1 10*3/uL (ref 4.0–10.5)

## 2015-09-06 LAB — TYPE AND SCREEN
ABO/RH(D): O POS
Antibody Screen: NEGATIVE
Unit division: 0

## 2015-09-06 NOTE — Telephone Encounter (Signed)
[facility] Post-discharge Follow-Up Phone Call:  Attempt #1  Date of Discharge:09/05/2015 Principal Discharge Diagnosis(es):  DKA, diabetic neuropathy, CKD, diabetic gastroparesis Post-discharge Communication: (Clearly document all attempts clearly and date contact made) Call placed to # [phone] (M) and a HIPAA compliant voice mail message was left requesting a call back to # [phone] or [phone].  Call Completed: No

## 2015-09-07 ENCOUNTER — Telehealth: Payer: Self-pay

## 2015-09-07 NOTE — Telephone Encounter (Signed)
Attempt #2 to reach patient to determine if patient agreeable to medical management and case management services with the [facility].  Call placed to [phone]; unable to reach patient. HIPPA compliant voicemail left requesting return call. Patient has appointment scheduled on 09/20/15 at 1400 with Dr. Jarold Song.  Awaiting return call from patient.

## 2015-09-08 ENCOUNTER — Telehealth: Payer: Self-pay

## 2015-09-08 NOTE — Telephone Encounter (Signed)
[facility] Post-discharge Follow-Up Phone Call:  Date of Discharge: 09/05/15 Principal Discharge Diagnosis(es): DKA, diabetic neuropathy, CKD, diabetic gastropareis Call Completed: Yes                   With Whom: Patient   Please check all that apply:  X  Patient is knowledgeable of his/her condition(s) and/or treatment. X  Patient is caring for self at home.  ? Patient is receiving assist at home from family and/or caregiver. Family and/or caregiver is knowledgeable of patient's condition(s) and/or treatment. ? Patient is receiving home health services. If so, name of agency.     Medication Reconciliation:  ? Medication list reviewed with patient. X  Patient obtained all discharge medications-NO. Patient's discharge medications reviewed. Patient indicated she had not felt like leaving her home to pick up medications from the pharmacy. She indicated she planned to ask her son to pick up her medications from [facility] tomorrow-09/09/15. Reminded patient of importance of medication compliance.   Activities of Daily Living:  X  Independent ? Needs assist  ? Total Care   Community resources in place for patient:  X  None  ? Home Health/Home DME ? Assisted Living ? Support Group          Patient Education: Patient listed as having [employer]. Inquired if patient had been able to resolve her insurance issues. Patient indicated she is uninsured. She indicated she was dismissed from her job because she was unable to return to work on 08/02/15. Informed patient she would benefit from meeting with a Development worker, community at [facility] to determine if eligible for the [employer] or [employer]. Patient verbalized understanding and indicated she planned to call [facility] at a later time to schedule Financial Counselor appointment. Also informed patient she may benefit  from going to DSS to determine if eligible for Medicaid. Patient appreciative of information.         Questions/Concerns discussed: This Case Manager placed call to patient to determine plans for medical follow-up. Patient agreeable to continued follow-up with the [facility]. Inquired about her status. Patient indicated she had a blood transfusion prior to discharge on 09/05/15 and "each day she is feeling a little stronger." Inquired if patient checking blood glucose as directed. She indicated she was. Instructed patient to keep blood glucose log and to bring record to her upcoming appointment. Patient verbalized understanding. Reminded patient of her appointment on 09/20/15 at 1400 with Dr. Jarold Song. Patient verbalized understanding and indicated she would need transportation to her appointment. She indicated she is currently living with her son; therefore, her current address is:  [address], [place], [place], [place] [address].  [place], informed patient would need transportation to her upcoming appointment. No additional needs/concerns identified.

## 2015-09-12 ENCOUNTER — Telehealth: Payer: Self-pay

## 2015-09-12 NOTE — Telephone Encounter (Signed)
Attempted to contact the patient to check on her status and to inquire if she picked up her medications at the pharmacy.  Call placed to # [phone] (M) and a HIPAA compliant voice mail message was left requesting a call back to # [phone] or [phone].

## 2015-09-14 ENCOUNTER — Telehealth: Payer: Self-pay

## 2015-09-14 NOTE — Telephone Encounter (Signed)
This Case Manager placed call to patient to check on status and to determine if she picked up her discharge medications. Call placed to [phone]; unable to reach patient. HIPPA compliant voicemail left requesting return call.

## 2015-09-19 ENCOUNTER — Telehealth: Payer: Self-pay

## 2015-09-19 NOTE — Telephone Encounter (Signed)
Call placed to the patient to check on her status and to remind her of her appointment at the [facility] at the [facility] tomorrow, 09/20/15 @ 1400.  She said that she will be at her appointment but will need transportation. Jacklynn Lewis, [facility] scheduler, notiified of the patient's need for transportation to the clinic. She is living at [address], [place], [place] [address]. The patient said that she still has not picked up any new medication at the pharmacy but noted that it is only the iron that she does not have. She reported compliance with her other medication. She said that she has been checking her blood sugar regularly and it has been running 200's-300's.  Instructed her to bring her blood sugar log and medications to her appointment tomorrow and she stated that she would.  She stated that she is dong "okay" and reported no problems/questions at this time. She said that she has an appointment with the financial counselor at [facility] on 09/23/15. As per Jacklynn Lewis, there are no appointments available tomorrow with financial counseling.

## 2015-09-20 ENCOUNTER — Encounter: Payer: Self-pay | Admitting: Family Medicine

## 2015-09-20 ENCOUNTER — Ambulatory Visit: Payer: 59 | Attending: Family Medicine | Admitting: Family Medicine

## 2015-09-20 VITALS — BP 161/73 | HR 96 | Temp 97.1°F | Resp 15 | Ht 62.0 in | Wt 137.6 lb

## 2015-09-20 DIAGNOSIS — Z794 Long term (current) use of insulin: Secondary | ICD-10-CM | POA: Insufficient documentation

## 2015-09-20 DIAGNOSIS — F329 Major depressive disorder, single episode, unspecified: Secondary | ICD-10-CM | POA: Insufficient documentation

## 2015-09-20 DIAGNOSIS — E1040 Type 1 diabetes mellitus with diabetic neuropathy, unspecified: Secondary | ICD-10-CM | POA: Insufficient documentation

## 2015-09-20 DIAGNOSIS — E1143 Type 2 diabetes mellitus with diabetic autonomic (poly)neuropathy: Secondary | ICD-10-CM

## 2015-09-20 DIAGNOSIS — E162 Hypoglycemia, unspecified: Secondary | ICD-10-CM

## 2015-09-20 DIAGNOSIS — F419 Anxiety disorder, unspecified: Secondary | ICD-10-CM | POA: Insufficient documentation

## 2015-09-20 DIAGNOSIS — D509 Iron deficiency anemia, unspecified: Secondary | ICD-10-CM | POA: Diagnosis not present

## 2015-09-20 DIAGNOSIS — K3184 Gastroparesis: Secondary | ICD-10-CM | POA: Diagnosis not present

## 2015-09-20 DIAGNOSIS — Z79899 Other long term (current) drug therapy: Secondary | ICD-10-CM | POA: Insufficient documentation

## 2015-09-20 DIAGNOSIS — E1065 Type 1 diabetes mellitus with hyperglycemia: Secondary | ICD-10-CM | POA: Diagnosis not present

## 2015-09-20 DIAGNOSIS — F418 Other specified anxiety disorders: Secondary | ICD-10-CM | POA: Diagnosis not present

## 2015-09-20 DIAGNOSIS — F32A Depression, unspecified: Secondary | ICD-10-CM

## 2015-09-20 LAB — GLUCOSE, POCT (MANUAL RESULT ENTRY)
POC Glucose: 29 mg/dl — AB (ref 70–99)
POC Glucose: 69 mg/dl — AB (ref 70–99)

## 2015-09-20 MED ORDER — PROMETHAZINE HCL 25 MG RE SUPP: 25.0000 mg | Freq: Four times a day (QID) | RECTAL | Status: DC | PRN

## 2015-09-20 MED ORDER — LISINOPRIL 5 MG PO TABS: 5.0000 mg | ORAL_TABLET | Freq: Every day | ORAL | Status: DC

## 2015-09-20 MED FILL — PHENADOZ 25 MG SUPP: 25 | 3 days supply | Qty: 12 | Fill #0

## 2015-09-20 MED FILL — LISINOPRIL 5 MG TABLET: 5 | 30 days supply | Qty: 30 | Fill #0

## 2015-09-20 NOTE — Patient Instructions (Signed)
Hypoglycemia Hypoglycemia occurs when the glucose in your blood is too low. Glucose is a type of sugar that is your body's main energy source. Hormones, such as insulin and glucagon, control the level of glucose in the blood. Insulin lowers blood glucose and glucagon increases blood glucose. Having too much insulin in your blood stream, or not eating enough food containing sugar, can result in hypoglycemia. Hypoglycemia can happen to people with or without diabetes. It can develop quickly and can be a medical emergency.  CAUSES   Missing or delaying meals.  Not eating enough carbohydrates at meals.  Taking too much diabetes medicine.  Not timing your oral diabetes medicine or insulin doses with meals, snacks, and exercise.  Nausea and vomiting.  Certain medicines.  Severe illnesses, such as hepatitis, kidney disorders, and certain eating disorders.  Increased activity or exercise without eating something extra or adjusting medicines.  Drinking too much alcohol.  A nerve disorder that affects body functions like your heart rate, blood pressure, and digestion (autonomic neuropathy).  A condition where the stomach muscles do not function properly (gastroparesis). Therefore, medicines and food may not absorb properly.  Rarely, a tumor of the pancreas can produce too much insulin. SYMPTOMS   Hunger.  Sweating (diaphoresis).  Change in body temperature.  Shakiness.  Headache.  Anxiety.  Lightheadedness.  Irritability.  Difficulty concentrating.  Dry mouth.  Tingling or numbness in the hands or feet.  Restless sleep or sleep disturbances.  Altered speech and coordination.  Change in mental status.  Seizures or prolonged convulsions.  Combativeness.  Drowsiness (lethargic).  Weakness.  Increased heart rate or palpitations.  Confusion.  Pale, gray skin color.  Blurred or double vision.  Fainting. DIAGNOSIS  A physical exam and medical history will be  performed. Your caregiver may make a diagnosis based on your symptoms. Blood tests and other lab tests may be performed to confirm a diagnosis. Once the diagnosis is made, your caregiver will see if your signs and symptoms go away once your blood glucose is raised.  TREATMENT  Usually, you can easily treat your hypoglycemia when you notice symptoms.  Check your blood glucose. If it is less than 70 mg/dl, take one of the following:   3-4 glucose tablets.    cup juice.    cup regular soda.   1 cup skim milk.   -1 tube of glucose gel.   5-6 hard candies.   Avoid high-fat drinks or food that may delay a rise in blood glucose levels.  Do not take more than the recommended amount of sugary foods, drinks, gel, or tablets. Doing so will cause your blood glucose to go too high.   Wait 10-15 minutes and recheck your blood glucose. If it is still less than 70 mg/dl or below your target range, repeat treatment.   Eat a snack if it is more than 1 hour until your next meal.  There may be a time when your blood glucose may go so low that you are unable to treat yourself at home when you start to notice symptoms. You may need someone to help you. You may even faint or be unable to swallow. If you cannot treat yourself, someone will need to bring you to the hospital.  HOME CARE INSTRUCTIONS  If you have diabetes, follow your diabetes management plan by:  Taking your medicines as directed.  Following your exercise plan.  Following your meal plan. Do not skip meals. Eat on time.  Testing your blood   glucose regularly. Check your blood glucose before and after exercise. If you exercise longer or different than usual, be sure to check blood glucose more frequently.  Wearing your medical alert jewelry that says you have diabetes.  Identify the cause of your hypoglycemia. Then, develop ways to prevent the recurrence of hypoglycemia.  Do not take a hot bath or shower right after an  insulin shot.  Always carry treatment with you. Glucose tablets are the easiest to carry.  If you are going to drink alcohol, drink it only with meals.  Tell friends or family members ways to keep you safe during a seizure. This may include removing hard or sharp objects from the area or turning you on your side.  Maintain a healthy weight. SEEK MEDICAL CARE IF:   You are having problems keeping your blood glucose in your target range.  You are having frequent episodes of hypoglycemia.  You feel you might be having side effects from your medicines.  You are not sure why your blood glucose is dropping so low.  You notice a change in vision or a new problem with your vision. SEEK IMMEDIATE MEDICAL CARE IF:   Confusion develops.  A change in mental status occurs.  The inability to swallow develops.  Fainting occurs.   This information is not intended to replace advice given to you by your health care provider. Make sure you discuss any questions you have with your health care provider.   Document Released: 06/11/2005 Document Revised: 06/16/2013 Document Reviewed: 02/15/2015 Elsevier Interactive Patient Education 2016 Elsevier Inc.  

## 2015-09-20 NOTE — Progress Notes (Signed)
Patient presents to clinic eating cracker and states she did not each lunch CBG 29 Peanut butter crackers and coke given which did not help-instant glucose given and patient is not alert and oriented

## 2015-09-21 NOTE — Progress Notes (Signed)
[place]  Date of Telephone encounter: 09/05/15  Admit Date: 09/02/15 Discharge Date: 09/05/15  Subjective:  Patient ID: [name], female    DOB: 05/14/1966  Age: 50 y.o. MRN: [id number]  CC: Diabetes   HPI [name] is a 51 year old female with a history of uncontrolled type 1 diabetes mellitus, diabetic neuropathy, diabetic gastroparesis, anemia, anxiety and depression, multiple hospitalizations for diabetic gastroparesis with recent hospitalization for diabetic ketoacidosis. She had presented with a blood sugar of 531 and anion gap of 22 and was admitted to stepdown unit for DKA where she received IV fluids, IV insulin which was later transitioned to subcutaneous insulin. During her hospital course she received 1 unit of PRBC due to a hemoglobin of 7 with resulting improvement to 9.1 at discharge.  She was brought back to the exam room today and found to have a declining mental status and blood sugar in the clinic was 29; crackers, coke administered and patient remained obtunded. Instant glucose administered into buccal mucosa with resulting improvement in mental status and blood sugar improved to 69. She subsequently informed me that she had administered 5 units of one of her insulin for a blood sugar of 200 but was unsure if she had administered Levemir on NovoLog but went on to give herself an additional 5 units of NovoLog thinking she had initially administered Levemir. She had also skipped lunch. She does not have her blood sugar with the and endorses some other episodes of hypoglycemia in the past. With regard to anemia she informs me she does have menorrhagia and was told in the past this was due to uterine fibroids with no recommendation for surgical management.  Outpatient Prescriptions Prior to Visit  Medication Sig Dispense Refill  . atorvastatin (LIPITOR) 20 MG tablet Take 1 tablet (20 mg total) by mouth daily at 6 PM. 30 tablet 0  . Blood Glucose  Monitoring Suppl (TRUE METRIX METER) DEVI 1 each by Does not apply route 3 (three) times daily before meals. 1 Device 0  . busPIRone (BUSPAR) 5 MG tablet Take 1 tablet (5 mg total) by mouth 3 (three) times daily. 90 tablet 1  . docusate sodium (COLACE) 100 MG capsule Take 1 capsule (100 mg total) by mouth 2 (two) times daily. 60 capsule 0  . feeding supplement, GLUCERNA SHAKE, (GLUCERNA SHAKE) LIQD Take 237 mLs by mouth 3 (three) times daily between meals.    . Ferrous Fumarate (HEMOCYTE - 106 MG FE) 324 (106 Fe) MG TABS tablet Take 1 tablet (106 mg of iron total) by mouth 2 (two) times daily. 60 tablet 0  . FLUoxetine (PROZAC) 40 MG capsule Take 40 mg by mouth daily.    Marland Kitchen glucose blood (TRUE METRIX BLOOD GLUCOSE TEST) test strip Use 3 times daily before meals 100 each 5  . insulin aspart (NOVOLOG) 100 UNIT/ML injection Inject 0-15 Units into the skin 3 (three) times daily with meals. 2-15 units three times daily with food. Per sliding scale 1 vial 12  . insulin detemir (LEVEMIR) 100 UNIT/ML injection Inject 0.2 mLs (20 Units total) into the skin daily. 10 mL 11  . Insulin Syringe-Needle U-100 (BD INSULIN SYRINGE ULTRAFINE) 31G X 15/64" 0.5 ML MISC 1 each by Does not apply route 4 (four) times daily as needed. 120 each 5  . ondansetron (ZOFRAN ODT) 4 MG disintegrating tablet Take 1 tablet (4 mg total) by mouth every 8 (eight) hours as needed for nausea. 40 tablet 0  . pantoprazole (PROTONIX) 40 MG  tablet Take 40 mg by mouth daily. Reported on 07/19/2015    . polyethylene glycol powder (GLYCOLAX/MIRALAX) powder Take 17 g by mouth daily as needed for moderate constipation. Reported on 07/19/2015    . pregabalin (LYRICA) 150 MG capsule Take 1 capsule (150 mg total) by mouth 2 (two) times daily. 60 capsule 0  . TRUEPLUS LANCETS 28G MISC 1 each by Does not apply route 3 (three) times daily before meals. 100 each 5  . lisinopril (PRINIVIL,ZESTRIL) 5 MG tablet Take 5 mg by mouth daily.  2   No  facility-administered medications prior to visit.    ROS Review of Systems  Reason unable to perform ROS: On initial review of systems there was a mental status change; which improved with administration of instant glucose in the clinic.  Constitutional: Negative for activity change, appetite change and fatigue.  HENT: Negative for congestion, sinus pressure and sore throat.   Eyes: Negative for visual disturbance.  Respiratory: Negative for cough, chest tightness, shortness of breath and wheezing.   Cardiovascular: Negative for chest pain and palpitations.  Gastrointestinal: Negative for abdominal pain, constipation and abdominal distention.  Endocrine: Negative for polydipsia.  Genitourinary: Positive for menstrual problem. Negative for dysuria and frequency.  Musculoskeletal: Negative for back pain and arthralgias.  Skin: Negative for rash.  Neurological: Negative for tremors, light-headedness and numbness.  Hematological: Does not bruise/bleed easily.  Psychiatric/Behavioral: Negative for behavioral problems and agitation.    Objective:  BP 161/73 mmHg  Pulse 96  Temp(Src) 97.1 F (36.2 C)  Resp 15  Ht 5\' 2"  (1.575 m)  Wt 137 lb 9.6 oz (62.415 kg)  BMI 25.16 kg/m2  SpO2 100%  BP/Weight 09/20/2015 09/05/2015 [id number]  Systolic BP Q000111Q 0000000 -  Diastolic BP 73 70 -  Wt. (Lbs) 137.6 - 125.22  BMI 25.16 - 23.67       Physical Exam  Constitutional:  Acute mental status decline due to hypoglycemia which improved on administration of instant glucose  Eyes: Pupils are equal, round, and reactive to light.  Cardiovascular: Normal rate, normal heart sounds and intact distal pulses.   No murmur heard. Pulmonary/Chest: Effort normal and breath sounds normal. She has no wheezes. She has no rales. She exhibits no tenderness.  Abdominal: Soft. Bowel sounds are normal. She exhibits no distension and no mass. There is no tenderness.  Musculoskeletal: Normal range of motion.    Neurological:  Initial mental status decline but orientation remained intact; mental status improved after glucose administration   CBC Latest Ref Rng 09/05/2015 09/05/2015 09/04/2015  WBC 4.0 - 10.5 K/uL 6.1 5.2 6.8  Hemoglobin 12.0 - 15.0 g/dL 9.1(L) 7.0(L) 7.4(L)  Hematocrit 36.0 - 46.0 % 28.6(L) 24.0(L) 24.6(L)  Platelets 150 - 400 K/uL 338 315 366    CMP Latest Ref Rng 09/05/2015 09/04/2015 09/03/2015  Glucose 65 - 99 mg/dL 98 64(L) 181(H)  BUN 6 - 20 mg/dL 11 10 12   Creatinine 0.44 - 1.00 mg/dL 0.90 0.89 0.88  Sodium 135 - 145 mmol/L 140 141 137  Potassium 3.5 - 5.1 mmol/L 3.9 3.0(L) 3.6  Chloride 101 - 111 mmol/L 108 108 108  CO2 22 - 32 mmol/L 25 26 20(L)  Calcium 8.9 - 10.3 mg/dL 8.6(L) 8.5(L) 8.6(L)  Total Protein 6.5 - 8.1 g/dL - 5.9(L) -  Total Bilirubin 0.3 - 1.2 mg/dL - 0.9 -  Alkaline Phos 38 - 126 U/L - 76 -  AST 15 - 41 U/L - 24 -  ALT 14 - 54 U/L -  22 -      Assessment & Plan:   1. Diabetic neuropathy, type I diabetes mellitus ([place]) Remains on Lyrica - Glucose (CBG) - lisinopril (PRINIVIL,ZESTRIL) 5 MG tablet; Take 1 tablet (5 mg total) by mouth daily.  Dispense: 30 tablet; Refill: 2 - Microalbumin, urine; Future - COMPLETE METABOLIC PANEL WITH GFR; Future - CBC with Differential/Platelet; Future - Glucose (CBG)  2. Anxiety and depression Currently on BuSpar  3. Anemia, iron deficiency Anemia could be secondary to history of uterine fibroids. We'll repeat CBC today, and advised to comply with iron pills. Will need pelvic ultrasound after which I will refer her to GYN-advised to maintain with financial department for financial clearance she has no medical coverage  4. Diabetic gastroparesis ([place]) Continue Zofran as needed; advised to eat small frequent meals - promethazine (PHENERGAN) 25 MG suppository; Place 1 suppository (25 mg total) rectally every 6 (six) hours as needed for nausea or vomiting.  Dispense: 12 each; Refill: 0 Was previously followed by  GI and was on Marinol which she has now stopped  5. Type 1 diabetes mellitus with Hypoglycemia Due to over administration of insulin. Patient given crackers and Coke with no improvement in sugar and so instant glucose administered with resulting improvement in hypoglycemia from 29 to 17 Educated on proper administration of insulin and to exercise caution. A1c of 8.7 with sugars fluctuating from hypoglycemia to hyperglycemia Unable to see endocrine due to lack of medical coverage Will not change regimen at this time and she has been advised to bring in her blood sugar log for review at next visit.   Meds ordered this encounter  Medications  . lisinopril (PRINIVIL,ZESTRIL) 5 MG tablet    Sig: Take 1 tablet (5 mg total) by mouth daily.    Dispense:  30 tablet    Refill:  2  . promethazine (PHENERGAN) 25 MG suppository    Sig: Place 1 suppository (25 mg total) rectally every 6 (six) hours as needed for nausea or vomiting.    Dispense:  12 each    Refill:  0    Follow-up: Return in about 2 weeks (around 10/04/2015) for TCC - follow-up on diabetes mellitus.   [name] MD

## 2015-09-22 ENCOUNTER — Telehealth: Payer: Self-pay

## 2015-09-22 NOTE — Telephone Encounter (Signed)
This Case Manager placed call to patient to remind her of appointment on 09/23/15 at 1430 with Financial Counselor.  Informed patient of the documentation to bring to her appointment. Patient verbalized understanding and indicated she will need transportation to her upcoming appointment. Clinic scheduler notified of patient's need for transportation. Inquired if patient picked up new scripts (lisinopril, phenergan suppositories). Patient indicated she had not, but she planned to pick up her medications when she came to clinic on 09/23/15.  Also informed patient of her appointment on 10/04/15 with Dr. Jarold Song. Instructed patient to keep blood glucose log and to bring log to her appointment. Patient verbalized understanding. No additional needs/concerns identified.

## 2015-09-23 ENCOUNTER — Ambulatory Visit: Payer: 59

## 2015-09-23 ENCOUNTER — Ambulatory Visit: Payer: 59 | Attending: Family Medicine

## 2015-09-23 DIAGNOSIS — E1040 Type 1 diabetes mellitus with diabetic neuropathy, unspecified: Secondary | ICD-10-CM

## 2015-09-23 DIAGNOSIS — D6489 Other specified anemias: Secondary | ICD-10-CM

## 2015-09-23 DIAGNOSIS — E538 Deficiency of other specified B group vitamins: Secondary | ICD-10-CM

## 2015-09-24 LAB — MICROALBUMIN, URINE: Microalb, Ur: 6.5 mg/dL

## 2015-09-28 ENCOUNTER — Telehealth: Payer: Self-pay

## 2015-09-28 NOTE — Telephone Encounter (Signed)
Call placed to the patient to check on her status and to inquire if she has picked up her prescriptions that were filled. She said that she has been feeling " okay" and picked up her medications.. She noted that her blood sugars have been 100's - occasional 200's.  She said that forgot to keep a log of her blood sugars and this CM instructed her regarding the importance of keeping a log of her blood sugars and bringing it to her appointment next week with Dr Jarold Song.  She said that she would.   She also noted that she meet with the Financial Counselor at [facility] and started the application for the [employer]. She said that she was told that the [facility] system still shows her has having [facility]).  She stated that the Financial Counselor told her that it may take a few months for the [facility] to clear the system. In the meantime, she needs to drop off a copy of a valid ID, some tax information and has a checklist of what is still needed for the orange card application.  She said that she does not need another financial counseling appointment and just needs to drop off the information.  Reminded her of her appointment with Dr Jarold Song on 10/04/15 and she stated that she was aware of the appointment.  No other problems/questions reported.

## 2015-10-03 ENCOUNTER — Telehealth: Payer: Self-pay

## 2015-10-03 NOTE — Telephone Encounter (Signed)
Attempted to contact the patient to check on her status and to remind her of her [facility] appointment tomorrow, 10/04/15 @ 1400 and to inquire if she needs transportation to the clinic.  Call placed to # [phone] (M) and a HIPAA compliant voicemail message was left requesting a call back to # [phone] or [phone].

## 2015-10-04 ENCOUNTER — Ambulatory Visit: Payer: 59 | Admitting: Family Medicine

## 2015-10-12 ENCOUNTER — Telehealth: Payer: Self-pay

## 2015-10-12 NOTE — Telephone Encounter (Signed)
Attempted to contact the patient to check on her status and discuss scheduling a follow up appointment at [facility]. Call placed to # [phone] (M) and a HIPAA compliant voice mail message was left requesting a call back to # [phone] or [phone].

## 2015-10-19 ENCOUNTER — Telehealth: Payer: Self-pay

## 2015-10-19 NOTE — Telephone Encounter (Signed)
This Case Manager placed call to patient to check on status and to remind her of upcoming appointment with Dr. Jarold Song on 10/20/15 at 0945. Unable to reach patient; HIPPA compliant voicemail left requesting return call.

## 2015-10-20 ENCOUNTER — Encounter: Payer: Self-pay | Admitting: Family Medicine

## 2015-10-20 ENCOUNTER — Encounter: Payer: Self-pay | Admitting: Clinical

## 2015-10-20 ENCOUNTER — Ambulatory Visit: Payer: 59 | Attending: Family Medicine | Admitting: Family Medicine

## 2015-10-20 VITALS — BP 136/67 | HR 89 | Temp 98.1°F | Resp 16 | Ht 62.0 in | Wt 141.8 lb

## 2015-10-20 DIAGNOSIS — E1143 Type 2 diabetes mellitus with diabetic autonomic (poly)neuropathy: Secondary | ICD-10-CM | POA: Diagnosis not present

## 2015-10-20 DIAGNOSIS — F418 Other specified anxiety disorders: Secondary | ICD-10-CM

## 2015-10-20 DIAGNOSIS — Z79899 Other long term (current) drug therapy: Secondary | ICD-10-CM | POA: Insufficient documentation

## 2015-10-20 DIAGNOSIS — F32A Depression, unspecified: Secondary | ICD-10-CM

## 2015-10-20 DIAGNOSIS — E1010 Type 1 diabetes mellitus with ketoacidosis without coma: Secondary | ICD-10-CM | POA: Diagnosis not present

## 2015-10-20 DIAGNOSIS — Z794 Long term (current) use of insulin: Secondary | ICD-10-CM | POA: Insufficient documentation

## 2015-10-20 DIAGNOSIS — K3184 Gastroparesis: Secondary | ICD-10-CM

## 2015-10-20 DIAGNOSIS — F329 Major depressive disorder, single episode, unspecified: Secondary | ICD-10-CM | POA: Insufficient documentation

## 2015-10-20 DIAGNOSIS — D6489 Other specified anemias: Secondary | ICD-10-CM

## 2015-10-20 DIAGNOSIS — J302 Other seasonal allergic rhinitis: Secondary | ICD-10-CM | POA: Insufficient documentation

## 2015-10-20 DIAGNOSIS — D509 Iron deficiency anemia, unspecified: Secondary | ICD-10-CM | POA: Insufficient documentation

## 2015-10-20 DIAGNOSIS — F419 Anxiety disorder, unspecified: Secondary | ICD-10-CM | POA: Insufficient documentation

## 2015-10-20 LAB — GLUCOSE, POCT (MANUAL RESULT ENTRY): POC GLUCOSE: 273 mg/dL — AB (ref 70–99)

## 2015-10-20 MED ORDER — BUSPIRONE HCL 5 MG PO TABS: 5.0000 mg | ORAL_TABLET | Freq: Three times a day (TID) | ORAL | Status: DC

## 2015-10-20 MED ORDER — ALBUTEROL SULFATE HFA 108 (90 BASE) MCG/ACT IN AERS: 2.0000 | INHALATION_SPRAY | Freq: Four times a day (QID) | RESPIRATORY_TRACT | Status: DC | PRN

## 2015-10-20 MED ORDER — INSULIN ASPART 100 UNIT/ML ~~LOC~~ SOLN: 0.0000 [IU] | Freq: Three times a day (TID) | SUBCUTANEOUS | Status: DC

## 2015-10-20 MED ORDER — CETIRIZINE HCL 10 MG PO TABS: 10.0000 mg | ORAL_TABLET | Freq: Every day | ORAL | Status: DC

## 2015-10-20 MED ORDER — INSULIN DETEMIR 100 UNIT/ML ~~LOC~~ SOLN: 25.0000 [IU] | Freq: Every day | SUBCUTANEOUS | Status: DC

## 2015-10-20 MED FILL — LEVEMIR 100 UNITS/ML VIAL: 100 | 28 days supply | Qty: 10 | Fill #0

## 2015-10-20 MED FILL — ?CETIRIZINE HCL 10 MG TABLE: 10 | 30 days supply | Qty: 30 | Fill #0

## 2015-10-20 MED FILL — !VENTOLIN HFA INHALER: 108 (90 BAS | 28 days supply | Qty: 18 | Fill #0

## 2015-10-20 MED FILL — !NOVOLOG 100UNITS/ML VIAL: 100/ML | 10 days supply | Qty: 10 | Fill #0

## 2015-10-20 MED FILL — ?BUSPIRONE HCL 5 MG TABLET: 5 | 30 days supply | Qty: 90 | Fill #0

## 2015-10-20 NOTE — Progress Notes (Signed)
Depression screen [facility] 2/9 10/20/2015 09/20/2015 08/16/2015  Decreased Interest 2 0 0  Down, Depressed, Hopeless 1 0 0  PHQ - 2 Score 3 0 0  Altered sleeping 3 - -  Tired, decreased energy 3 - -  Change in appetite 1 - -  Feeling bad or failure about yourself  0 - -  Trouble concentrating 0 - -  Moving slowly or fidgety/restless 0 - -  Suicidal thoughts 0 - -  PHQ-9 Score 10 - -    GAD 7 : Generalized Anxiety Score 10/20/2015 09/20/2015  Nervous, Anxious, on Edge 1 0  Control/stop worrying 2 0  Worry too much - different things 2 0  Trouble relaxing 1 0  Restless 0 0  Easily annoyed or irritable 1 0  Afraid - awful might happen 2 0  Total GAD 7 Score 9 0

## 2015-10-20 NOTE — Patient Instructions (Signed)
Diabetes Mellitus and Food It is important for you to manage your blood sugar (glucose) level. Your blood glucose level can be greatly affected by what you eat. Eating healthier foods in the appropriate amounts throughout the day at about the same time each day will help you control your blood glucose level. It can also help slow or prevent worsening of your diabetes mellitus. Healthy eating may even help you improve the level of your blood pressure and reach or maintain a healthy weight.  General recommendations for healthful eating and cooking habits include:  Eating meals and snacks regularly. Avoid going long periods of time without eating to lose weight.  Eating a diet that consists mainly of plant-based foods, such as fruits, vegetables, nuts, legumes, and whole grains.  Using low-heat cooking methods, such as baking, instead of high-heat cooking methods, such as deep frying. Work with your dietitian to make sure you understand how to use the Nutrition Facts information on food labels. HOW CAN FOOD AFFECT ME? Carbohydrates Carbohydrates affect your blood glucose level more than any other type of food. Your dietitian will help you determine how many carbohydrates to eat at each meal and teach you how to count carbohydrates. Counting carbohydrates is important to keep your blood glucose at a healthy level, especially if you are using insulin or taking certain medicines for diabetes mellitus. Alcohol Alcohol can cause sudden decreases in blood glucose (hypoglycemia), especially if you use insulin or take certain medicines for diabetes mellitus. Hypoglycemia can be a life-threatening condition. Symptoms of hypoglycemia (sleepiness, dizziness, and disorientation) are similar to symptoms of having too much alcohol.  If your health care provider has given you approval to drink alcohol, do so in moderation and use the following guidelines:  Women should not have more than one drink per day, and men  should not have more than two drinks per day. One drink is equal to:  12 oz of beer.  5 oz of wine.  1 oz of hard liquor.  Do not drink on an empty stomach.  Keep yourself hydrated. Have water, diet soda, or unsweetened iced tea.  Regular soda, juice, and other mixers might contain a lot of carbohydrates and should be counted. WHAT FOODS ARE NOT RECOMMENDED? As you make food choices, it is important to remember that all foods are not the same. Some foods have fewer nutrients per serving than other foods, even though they might have the same number of calories or carbohydrates. It is difficult to get your body what it needs when you eat foods with fewer nutrients. Examples of foods that you should avoid that are high in calories and carbohydrates but low in nutrients include:  Trans fats (most processed foods list trans fats on the Nutrition Facts label).  Regular soda.  Juice.  Candy.  Sweets, such as cake, pie, doughnuts, and cookies.  Fried foods. WHAT FOODS CAN I EAT? Eat nutrient-rich foods, which will nourish your body and keep you healthy. The food you should eat also will depend on several factors, including:  The calories you need.  The medicines you take.  Your weight.  Your blood glucose level.  Your blood pressure level.  Your cholesterol level. You should eat a variety of foods, including:  Protein.  Lean cuts of meat.  Proteins low in saturated fats, such as fish, egg whites, and beans. Avoid processed meats.  Fruits and vegetables.  Fruits and vegetables that may help control blood glucose levels, such as apples, mangoes, and   yams.  Dairy products.  Choose fat-free or low-fat dairy products, such as milk, yogurt, and cheese.  Grains, bread, pasta, and rice.  Choose whole grain products, such as multigrain bread, whole oats, and brown rice. These foods may help control blood pressure.  Fats.  Foods containing healthful fats, such as nuts,  avocado, olive oil, canola oil, and fish. DOES EVERYONE WITH DIABETES MELLITUS HAVE THE SAME MEAL PLAN? Because every person with diabetes mellitus is different, there is not one meal plan that works for everyone. It is very important that you meet with a dietitian who will help you create a meal plan that is just right for you.   This information is not intended to replace advice given to you by your health care provider. Make sure you discuss any questions you have with your health care provider.   Document Released: 03/08/2005 Document Revised: 07/02/2014 Document Reviewed: 05/08/2013 Elsevier Interactive Patient Education 2016 Elsevier Inc.  

## 2015-10-20 NOTE — Progress Notes (Signed)
Patient's here for cold sxs.  Patient reports having sore throat, runny nose,and feeling weak and tired. Patient refused throat culture and rapid strep test.  Patient reports taking insulin around 9:15 am this morning and all other medications.

## 2015-10-20 NOTE — Progress Notes (Signed)
Subjective:    Patient ID: [name], female    DOB: [birth year], 50 y.o.   MRN: [id number]  HPI She is a 50 year old female with a history of uncontrolled type 1 diabetes mellitus, diabetic neuropathy, diabetic gastroparesis, anemia, anxiety and depression, multiple hospitalizations for diabetic gastroparesis with recent hospitalization for diabetic ketoacidosis.  At her last office visit she had presented with hypoglycemia of 29 due to insulin overdose and had to receive instant glucose in the clinic.  Today she reports her random sugars are in the 300 -400 range despite compliance with prescribed regimen on insulin. She complains of a scratchy throat, post nasal drip, rhinorrhea, sneezing but no fever or sinus tenderness.  Past Medical History  Diagnosis Date  . Diabetes mellitus without complication [facility])     Past Surgical History  Procedure Laterality Date  . Retinal detachment surgery    . Posterior vitrectomy and membrane peel-left eye  09/28/2002  . Posterior vitrectomy and membrane peel-right eye  03/16/2002  . Eye surgery    . Gailstones    . Esophagogastroduodenoscopy  09/27/2014    at [facility], Dr Rolan Lipa. biospy neg for celiac, neg for H pylori.   . Colonoscopy  09/27/2014    at [facility]    Current Outpatient Prescriptions on File Prior to Visit  Medication Sig Dispense Refill  . atorvastatin (LIPITOR) 20 MG tablet Take 1 tablet (20 mg total) by mouth daily at 6 PM. 30 tablet 0  . Blood Glucose Monitoring Suppl (TRUE METRIX METER) DEVI 1 each by Does not apply route 3 (three) times daily before meals. 1 Device 0  . docusate sodium (COLACE) 100 MG capsule Take 1 capsule (100 mg total) by mouth 2 (two) times daily. 60 capsule 0  . feeding supplement, GLUCERNA SHAKE, (GLUCERNA SHAKE) LIQD Take 237 mLs by mouth 3 (three) times daily between meals.    . Ferrous Fumarate (HEMOCYTE - 106 MG FE) 324 (106 Fe) MG TABS tablet Take 1 tablet (106 mg of iron total) by mouth 2  (two) times daily. 60 tablet 0  . FLUoxetine (PROZAC) 40 MG capsule Take 40 mg by mouth daily.    Marland Kitchen glucose blood (TRUE METRIX BLOOD GLUCOSE TEST) test strip Use 3 times daily before meals 100 each 5  . Insulin Syringe-Needle U-100 (BD INSULIN SYRINGE ULTRAFINE) 31G X 15/64" 0.5 ML MISC 1 each by Does not apply route 4 (four) times daily as needed. 120 each 5  . lisinopril (PRINIVIL,ZESTRIL) 5 MG tablet Take 1 tablet (5 mg total) by mouth daily. 30 tablet 2  . ondansetron (ZOFRAN ODT) 4 MG disintegrating tablet Take 1 tablet (4 mg total) by mouth every 8 (eight) hours as needed for nausea. 40 tablet 0  . pantoprazole (PROTONIX) 40 MG tablet Take 40 mg by mouth daily. Reported on 07/19/2015    . polyethylene glycol powder (GLYCOLAX/MIRALAX) powder Take 17 g by mouth daily as needed for moderate constipation. Reported on 07/19/2015    . pregabalin (LYRICA) 150 MG capsule Take 1 capsule (150 mg total) by mouth 2 (two) times daily. 60 capsule 0  . promethazine (PHENERGAN) 25 MG suppository Place 1 suppository (25 mg total) rectally every 6 (six) hours as needed for nausea or vomiting. 12 each 0  . TRUEPLUS LANCETS 28G MISC 1 each by Does not apply route 3 (three) times daily before meals. 100 each 5   No current facility-administered medications on file prior to visit.      Review of Systems  Constitutional: Positive for fatigue. Negative for activity change and appetite change.  HENT: Positive for postnasal drip (described as scratchy throat), rhinorrhea and sore throat. Negative for congestion and sinus pressure.   Eyes: Negative for visual disturbance.  Respiratory: Negative for cough, chest tightness, shortness of breath and wheezing.   Cardiovascular: Negative for chest pain and palpitations.  Gastrointestinal: Negative for abdominal pain, constipation and abdominal distention.  Endocrine: Negative for polydipsia.  Genitourinary: Negative for dysuria and frequency.  Musculoskeletal: Negative  for back pain and arthralgias.  Skin: Negative for rash.  Neurological: Negative for tremors, light-headedness and numbness.  Hematological: Does not bruise/bleed easily.  Psychiatric/Behavioral: Negative for behavioral problems and agitation.       Objective: Filed Vitals:   10/20/15 1011  BP: 136/67  Pulse: 89  Temp: 98.1 F (36.7 C)  TempSrc: Oral  Resp: 16  Height: 5\' 2"  (1.575 m)  Weight: 141 lb 12.8 oz (64.32 kg)  SpO2: 100%      Physical Exam  Constitutional: She is oriented to person, place, and time. She appears well-developed and well-nourished.  Cardiovascular: Normal rate, normal heart sounds and intact distal pulses.   No murmur heard. Pulmonary/Chest: Effort normal and breath sounds normal. She has no wheezes. She has no rales. She exhibits no tenderness.  Abdominal: Soft. Bowel sounds are normal. She exhibits no distension and no mass. There is no tenderness.  Musculoskeletal: Normal range of motion.  Neurological: She is alert and oriented to person, place, and time.  Skin: Skin is warm and dry.  Psychiatric: She has a normal mood and affect.      CBC Latest Ref Rng 09/05/2015 09/05/2015 09/04/2015  WBC 4.0 - 10.5 K/uL 6.1 5.2 6.8  Hemoglobin 12.0 - 15.0 g/dL 9.1(L) 7.0(L) 7.4(L)  Hematocrit 36.0 - 46.0 % 28.6(L) 24.0(L) 24.6(L)  Platelets 150 - 400 K/uL 338 315 366        Assessment & Plan:  1. Diabetic neuropathy, type I diabetes mellitus ([place]) Remains on Lyrica  2. Anxiety and depression Currently on Prozac  Has not been taking BuSpar which I have refilled  3. Anemia, iron deficiency  Anemia could be secondary to history of uterine fibroids. Advised to comply with iron pills. Will need pelvic ultrasound after which I will refer her to GYN-advised to maintain with financial department for financial clearance she has no medical coverage  4. Diabetic gastroparesis ([place]) Continue Zofran as needed; advised to eat small frequent meals -  promethazine (PHENERGAN) 25 MG suppository; Place 1 suppository (25 mg total) rectally every 6 (six) hours as needed for nausea or vomiting.  Dispense: 12 each; Refill: 0 Was previously followed by GI and was on Marinol which she has now stopped  5. Type 1 diabetes mellitus  Uncontrolled with A1c of 8.7 from 07/29/15 Now with hyperglycemia; she has labile blood sugars Levemir increased to 25 units at bedtime and she has been advised to bring in her blood sugar log at her next visit for review Unable to see endocrine due to lack of medical coverage  6. Seasonal allergies Placed on pro-air and Zyrtec No antibiotic indicated.

## 2015-10-25 ENCOUNTER — Other Ambulatory Visit: Payer: Self-pay | Admitting: *Deleted

## 2015-10-25 ENCOUNTER — Other Ambulatory Visit: Payer: Self-pay | Admitting: Family Medicine

## 2015-10-25 DIAGNOSIS — E1040 Type 1 diabetes mellitus with diabetic neuropathy, unspecified: Secondary | ICD-10-CM

## 2015-10-25 DIAGNOSIS — E1010 Type 1 diabetes mellitus with ketoacidosis without coma: Secondary | ICD-10-CM

## 2015-10-25 MED ORDER — INSULIN DETEMIR 100 UNIT/ML ~~LOC~~ SOLN: 25.0000 [IU] | Freq: Every day | SUBCUTANEOUS | Status: DC

## 2015-10-25 MED ORDER — INSULIN ASPART 100 UNIT/ML ~~LOC~~ SOLN: 0.0000 [IU] | Freq: Three times a day (TID) | SUBCUTANEOUS | Status: DC

## 2015-10-25 MED ORDER — PREGABALIN 150 MG PO CAPS: 150.0000 mg | ORAL_CAPSULE | Freq: Two times a day (BID) | ORAL | Status: DC

## 2015-11-04 ENCOUNTER — Ambulatory Visit: Payer: 59 | Admitting: Family Medicine

## 2015-11-13 ENCOUNTER — Observation Stay (HOSPITAL_COMMUNITY)
Admission: EM | Admit: 2015-11-13 | Discharge: 2015-11-16 | Disposition: A | Discharge status: Home / Self Care | Payer: 59 | Attending: Internal Medicine | Admitting: Internal Medicine

## 2015-11-13 ENCOUNTER — Encounter (HOSPITAL_COMMUNITY): Payer: Self-pay

## 2015-11-13 DIAGNOSIS — E119 Type 2 diabetes mellitus without complications: Secondary | ICD-10-CM

## 2015-11-13 DIAGNOSIS — F329 Major depressive disorder, single episode, unspecified: Secondary | ICD-10-CM | POA: Insufficient documentation

## 2015-11-13 DIAGNOSIS — N179 Acute kidney failure, unspecified: Secondary | ICD-10-CM | POA: Diagnosis present

## 2015-11-13 DIAGNOSIS — K219 Gastro-esophageal reflux disease without esophagitis: Secondary | ICD-10-CM | POA: Insufficient documentation

## 2015-11-13 DIAGNOSIS — F32A Depression, unspecified: Secondary | ICD-10-CM | POA: Diagnosis present

## 2015-11-13 DIAGNOSIS — E1110 Type 2 diabetes mellitus with ketoacidosis without coma: Secondary | ICD-10-CM | POA: Diagnosis present

## 2015-11-13 DIAGNOSIS — R1110 Vomiting, unspecified: Secondary | ICD-10-CM

## 2015-11-13 DIAGNOSIS — R112 Nausea with vomiting, unspecified: Secondary | ICD-10-CM | POA: Diagnosis present

## 2015-11-13 DIAGNOSIS — E1040 Type 1 diabetes mellitus with diabetic neuropathy, unspecified: Secondary | ICD-10-CM | POA: Insufficient documentation

## 2015-11-13 DIAGNOSIS — Z79899 Other long term (current) drug therapy: Secondary | ICD-10-CM | POA: Insufficient documentation

## 2015-11-13 DIAGNOSIS — D509 Iron deficiency anemia, unspecified: Secondary | ICD-10-CM | POA: Insufficient documentation

## 2015-11-13 DIAGNOSIS — K3184 Gastroparesis: Secondary | ICD-10-CM | POA: Diagnosis present

## 2015-11-13 DIAGNOSIS — E1143 Type 2 diabetes mellitus with diabetic autonomic (poly)neuropathy: Secondary | ICD-10-CM | POA: Diagnosis present

## 2015-11-13 DIAGNOSIS — F419 Anxiety disorder, unspecified: Secondary | ICD-10-CM | POA: Insufficient documentation

## 2015-11-13 DIAGNOSIS — E1010 Type 1 diabetes mellitus with ketoacidosis without coma: Principal | ICD-10-CM | POA: Insufficient documentation

## 2015-11-13 DIAGNOSIS — E785 Hyperlipidemia, unspecified: Secondary | ICD-10-CM | POA: Insufficient documentation

## 2015-11-13 DIAGNOSIS — Z794 Long term (current) use of insulin: Secondary | ICD-10-CM | POA: Insufficient documentation

## 2015-11-13 DIAGNOSIS — E1043 Type 1 diabetes mellitus with diabetic autonomic (poly)neuropathy: Secondary | ICD-10-CM | POA: Insufficient documentation

## 2015-11-13 DIAGNOSIS — I10 Essential (primary) hypertension: Secondary | ICD-10-CM | POA: Insufficient documentation

## 2015-11-13 HISTORY — DX: Hyperlipidemia, unspecified: E78.5

## 2015-11-13 HISTORY — DX: Depression, unspecified: F32.A

## 2015-11-13 HISTORY — DX: Major depressive disorder, single episode, unspecified: F32.9

## 2015-11-13 HISTORY — DX: Essential (primary) hypertension: I10

## 2015-11-13 LAB — CBG MONITORING, ED: Glucose-Capillary: 353 mg/dL — ABNORMAL HIGH (ref 65–99)

## 2015-11-13 MED ORDER — MORPHINE SULFATE (PF) 4 MG/ML IV SOLN
4.0000 mg | Freq: Once | INTRAVENOUS | Status: AC
  Administered 2015-11-14: 4 mg via INTRAVENOUS
  Filled 2015-11-13: qty 1

## 2015-11-13 MED ORDER — SODIUM CHLORIDE 0.9 % IV BOLUS (SEPSIS)
1000.0000 mL | Freq: Once | INTRAVENOUS | Status: AC
  Administered 2015-11-14: 1000 mL via INTRAVENOUS

## 2015-11-13 MED ORDER — ONDANSETRON HCL 4 MG/2ML IJ SOLN
4.0000 mg | Freq: Once | INTRAMUSCULAR | Status: AC
  Administered 2015-11-14: 4 mg via INTRAVENOUS
  Filled 2015-11-13: qty 2

## 2015-11-13 NOTE — ED Notes (Signed)
Blood draw attempted x2 unsuccessful. Pt states they normally do a PICC line.

## 2015-11-13 NOTE — ED Notes (Signed)
Patient is from home and transported via [facility]. Patient is complaining of nausea, vomiting, and upper abd pain since 9am this morning. Pt has a history of gastroparesis.

## 2015-11-13 NOTE — ED Provider Notes (Signed)
CSN: [id number]     Arrival date & time 11/13/15  1732 History   First MD Initiated Contact with Patient 11/13/15 2255     Chief Complaint  Patient presents with  . Abdominal Pain  . Vomiting  PT CAME IN WITH N/V AND ABD PAIN.  SHE HAS A HX OF FREQUENT DKA ADMISSIONS AND DIABETIC GASTROPARESIS.  THE PT SAID THAT SX STARTED EARLIER TODAY.     (Consider location/radiation/quality/duration/timing/severity/associated sxs/prior Treatment) Patient is a 50 y.o. female presenting with abdominal pain. The history is provided by the patient.  Abdominal Pain Pain location:  Generalized Pain quality: cramping   Pain radiates to:  Does not radiate Pain severity:  Mild Timing:  Intermittent Progression:  Waxing and waning Chronicity:  Recurrent Associated symptoms: nausea and vomiting     Past Medical History  Diagnosis Date  . Diabetes mellitus without complication ([place])   . Essential hypertension   . HLD (hyperlipidemia)   . GERD (gastroesophageal reflux disease)   . Depression   . Gastroparesis    Past Surgical History  Procedure Laterality Date  . Retinal detachment surgery    . Posterior vitrectomy and membrane peel-left eye  09/28/2002  . Posterior vitrectomy and membrane peel-right eye  03/16/2002  . Eye surgery    . Gailstones    . Esophagogastroduodenoscopy  09/27/2014    at [facility], Dr Rolan Lipa. biospy neg for celiac, neg for H pylori.   . Colonoscopy  09/27/2014    at [facility]   Family History  Problem Relation Age of Onset  . Cystic fibrosis Mother   . Hypertension Father   . Diabetes Brother   . Hypertension Maternal Grandmother    Social History  Substance Use Topics  . Smoking status: Never Smoker   . Smokeless tobacco: Never Used  . Alcohol Use: No   OB History    No data available     Review of Systems  Gastrointestinal: Positive for nausea, vomiting and abdominal pain.  All other systems reviewed and are negative.     Allergies  Anesthetics, amide;  Penicillins; Buprenorphine hcl; and Encainide  Home Medications   Prior to Admission medications   Medication Sig Start Date End Date Taking? Authorizing Provider  albuterol (PROVENTIL HFA;VENTOLIN HFA) 108 (90 Base) MCG/ACT inhaler Inhale 2 puffs into the lungs every 6 (six) hours as needed for wheezing or shortness of breath. 10/20/15  Yes [name], MD  atorvastatin (LIPITOR) 20 MG tablet Take 1 tablet (20 mg total) by mouth daily at 6 PM. 06/25/15  Yes Allie Bossier, MD  Blood Glucose Monitoring Suppl (TRUE METRIX METER) DEVI 1 each by Does not apply route 3 (three) times daily before meals. 07/19/15  Yes [name], MD  busPIRone (BUSPAR) 5 MG tablet Take 1 tablet (5 mg total) by mouth 3 (three) times daily. 10/20/15  Yes [name], MD  cetirizine (ZYRTEC) 10 MG tablet Take 1 tablet (10 mg total) by mouth daily. 10/20/15  Yes [name], MD  docusate sodium (COLACE) 100 MG capsule Take 1 capsule (100 mg total) by mouth 2 (two) times daily. 09/05/15  Yes Modena Jansky, MD  feeding supplement, GLUCERNA SHAKE, (GLUCERNA SHAKE) LIQD Take 237 mLs by mouth 3 (three) times daily between meals. 09/05/15  Yes Modena Jansky, MD  Ferrous Fumarate (HEMOCYTE - 106 MG FE) 324 (106 Fe) MG TABS tablet Take 1 tablet (106 mg of iron total) by mouth 2 (two) times daily. 09/05/15  Yes Everlene Farrier D  Hongalgi, MD  FLUoxetine (PROZAC) 40 MG capsule Take 40 mg by mouth daily.   Yes Historical Provider, MD  glucose blood (TRUE METRIX BLOOD GLUCOSE TEST) test strip Use 3 times daily before meals 07/19/15  Yes [name], MD  insulin aspart (NOVOLOG) 100 UNIT/ML injection Inject 0-15 Units into the skin 3 (three) times daily with meals. Per sliding scale 10/25/15  Yes [name], MD  insulin detemir (LEVEMIR) 100 UNIT/ML injection Inject 0.25 mLs (25 Units total) into the skin daily. 10/25/15  Yes [name], MD  Insulin Syringe-Needle U-100 (BD INSULIN SYRINGE ULTRAFINE) 31G X 15/64" 0.5 ML MISC 1 each by Does not  apply route 4 (four) times daily as needed. 08/16/15  Yes [name], MD  lisinopril (PRINIVIL,ZESTRIL) 5 MG tablet Take 1 tablet (5 mg total) by mouth daily. 09/20/15  Yes [name], MD  ondansetron (ZOFRAN ODT) 4 MG disintegrating tablet Take 1 tablet (4 mg total) by mouth every 8 (eight) hours as needed for nausea. 07/19/15  Yes [name], MD  pantoprazole (PROTONIX) 40 MG tablet Take 40 mg by mouth daily. Reported on 07/19/2015 01/04/15  Yes Historical Provider, MD  polyethylene glycol powder (GLYCOLAX/MIRALAX) powder Take 17 g by mouth daily as needed for moderate constipation. Reported on 07/19/2015 04/06/15  Yes Historical Provider, MD  pregabalin (LYRICA) 150 MG capsule Take 1 capsule (150 mg total) by mouth 2 (two) times daily. 10/25/15  Yes [name], MD  promethazine (PHENERGAN) 25 MG suppository Place 1 suppository (25 mg total) rectally every 6 (six) hours as needed for nausea or vomiting. 09/20/15  Yes [name], MD  TRUEPLUS LANCETS 28G MISC 1 each by Does not apply route 3 (three) times daily before meals. 07/19/15  Yes Enobong Amao, MD   BP 172/80 mmHg  Pulse 102  Temp(Src) 98.1 F (36.7 C) (Oral)  Resp 20  Ht 5\' 2"  (1.575 m)  Wt 136 lb 11 oz (62 kg)  BMI 24.99 kg/m2  SpO2 100%  LMP 10/14/2015 Physical Exam  Constitutional: She is oriented to person, place, and time. She appears well-developed and well-nourished. She appears distressed.  HENT:  Head: Normocephalic and atraumatic.  Right Ear: External ear normal.  Left Ear: External ear normal.  Mouth/Throat: Mucous membranes are dry.  Eyes: Conjunctivae and EOM are normal. Pupils are equal, round, and reactive to light.  Neck: Normal range of motion. Neck supple.  Cardiovascular: Normal rate, regular rhythm, normal heart sounds and intact distal pulses.   Pulmonary/Chest: Effort normal and breath sounds normal.  Abdominal: Soft. Bowel sounds are normal. There is tenderness.  Musculoskeletal: Normal range of motion.   Neurological: She is alert and oriented to person, place, and time.  Skin: Skin is warm and dry.  Psychiatric: She has a normal mood and affect. Her behavior is normal. Judgment and thought content normal.  Nursing note and vitals reviewed.   ED Course  .Central Line Date/Time: 11/14/2015 1:04 AM Performed by: Isla Pence Authorized by: Isla Pence Consent: Verbal consent obtained. Risks and benefits: risks, benefits and alternatives were discussed Consent given by: patient Patient understanding: patient states understanding of the procedure being performed Patient consent: the patient's understanding of the procedure matches consent given Procedure consent: procedure consent matches procedure scheduled Relevant documents: relevant documents present and verified Test results: test results available and properly labeled Site marked: the operative site was marked Imaging studies: imaging studies available Patient identity confirmed: verbally with patient Indications: vascular access Anesthesia: local infiltration Local anesthetic: lidocaine  1% with epinephrine Anesthetic total: 2 ml Patient sedated: no Preparation: skin prepped with 2% chlorhexidine Skin prep agent dried: skin prep agent completely dried prior to procedure Sterile barriers: all five maximum sterile barriers used - cap, mask, sterile gown, sterile gloves, and large sterile sheet Hand hygiene: hand hygiene performed prior to central venous catheter insertion Location details: right femoral Catheter type: triple lumen Catheter size: 7 Fr Pre-procedure: landmarks identified Ultrasound guidance: no Number of attempts: 2 Successful placement: yes Post-procedure: line sutured and dressing applied Assessment: blood return through all ports and free fluid flow Patient tolerance: Patient tolerated the procedure well with no immediate complications   (including critical care time) Labs Review Labs Reviewed   COMPREHENSIVE METABOLIC PANEL - Abnormal; Notable for the following:    Sodium 131 (*)    Chloride 99 (*)    CO2 15 (*)    Glucose, Bld 500 (*)    BUN 27 (*)    Creatinine, Ser 1.20 (*)    Total Protein 8.2 (*)    Total Bilirubin 1.6 (*)    GFR calc non Af Amer 53 (*)    Anion gap 17 (*)    All other components within normal limits  URINALYSIS, ROUTINE W REFLEX MICROSCOPIC (NOT AT [facility]) - Abnormal; Notable for the following:    Glucose, UA >1000 (*)    Hgb urine dipstick LARGE (*)    Ketones, ur >80 (*)    All other components within normal limits  CBC WITH DIFFERENTIAL/PLATELET - Abnormal; Notable for the following:    Hemoglobin 10.2 (*)    HCT 32.6 (*)    MCV 72.1 (*)    MCH 22.6 (*)    RDW 17.6 (*)    Neutro Abs 8.8 (*)    All other components within normal limits  BLOOD GAS, VENOUS - Abnormal; Notable for the following:    pCO2, Ven 30.5 (*)    pO2, Ven 54.6 (*)    Bicarbonate 13.1 (*)    Acid-base deficit 12.6 (*)    All other components within normal limits  URINE MICROSCOPIC-ADD ON - Abnormal; Notable for the following:    Bacteria, UA RARE (*)    All other components within normal limits  BASIC METABOLIC PANEL - Abnormal; Notable for the following:    CO2 11 (*)    Glucose, Bld 416 (*)    BUN 23 (*)    Creatinine, Ser 1.29 (*)    Calcium 8.7 (*)    GFR calc non Af Amer 48 (*)    GFR calc Af Amer 56 (*)    Anion gap 16 (*)    All other components within normal limits  BASIC METABOLIC PANEL - Abnormal; Notable for the following:    Chloride 114 (*)    CO2 16 (*)    Glucose, Bld 166 (*)    BUN 23 (*)    Creatinine, Ser 1.35 (*)    Calcium 8.3 (*)    GFR calc non Af Amer 46 (*)    GFR calc Af Amer 53 (*)    All other components within normal limits  BASIC METABOLIC PANEL - Abnormal; Notable for the following:    Chloride 114 (*)    CO2 17 (*)    Glucose, Bld 169 (*)    BUN 25 (*)    Creatinine, Ser 1.15 (*)    Calcium 8.1 (*)    GFR calc non Af Amer  55 (*)    All other  components within normal limits  GLUCOSE, CAPILLARY - Abnormal; Notable for the following:    Glucose-Capillary 414 (*)    All other components within normal limits  GLUCOSE, CAPILLARY - Abnormal; Notable for the following:    Glucose-Capillary 395 (*)    All other components within normal limits  GLUCOSE, CAPILLARY - Abnormal; Notable for the following:    Glucose-Capillary 320 (*)    All other components within normal limits  GLUCOSE, CAPILLARY - Abnormal; Notable for the following:    Glucose-Capillary 255 (*)    All other components within normal limits  GLUCOSE, CAPILLARY - Abnormal; Notable for the following:    Glucose-Capillary 168 (*)    All other components within normal limits  GLUCOSE, CAPILLARY - Abnormal; Notable for the following:    Glucose-Capillary 144 (*)    All other components within normal limits  GLUCOSE, CAPILLARY - Abnormal; Notable for the following:    Glucose-Capillary 136 (*)    All other components within normal limits  GLUCOSE, CAPILLARY - Abnormal; Notable for the following:    Glucose-Capillary 149 (*)    All other components within normal limits  GLUCOSE, CAPILLARY - Abnormal; Notable for the following:    Glucose-Capillary 151 (*)    All other components within normal limits  GLUCOSE, CAPILLARY - Abnormal; Notable for the following:    Glucose-Capillary 163 (*)    All other components within normal limits  GLUCOSE, CAPILLARY - Abnormal; Notable for the following:    Glucose-Capillary 144 (*)    All other components within normal limits  CBG MONITORING, ED - Abnormal; Notable for the following:    Glucose-Capillary 353 (*)    All other components within normal limits  CBG MONITORING, ED - Abnormal; Notable for the following:    Glucose-Capillary 477 (*)    All other components within normal limits  CBG MONITORING, ED - Abnormal; Notable for the following:    Glucose-Capillary 486 (*)    All other components within normal  limits  MRSA PCR SCREENING  LIPASE, BLOOD  MAGNESIUM  CREATININE, URINE, RANDOM  SODIUM, URINE, RANDOM  PREGNANCY, URINE  BASIC METABOLIC PANEL  MAGNESIUM  I-STAT BETA HCG BLOOD, ED ([facility], [facility], [facility] ONLY)    Imaging Review No results found. I have personally reviewed and evaluated these images and lab results as part of my medical decision-making.   EKG Interpretation None      MDM  PT IS AN EXTREMELY DIFFICULT STICK.  MULTIPLE NURSES ATTEMPTED IV, BUT WERE UNSUCCESSFUL.  THE PT VERBALLY AGREED TO A CENTRAL LINE FOR TREATMENT.  PT SIGNED OUT TO DR. POLLINA PENDING RESULTS OF LABS. Final diagnoses:  Diabetes mellitus without complication ([place])  HLD (hyperlipidemia)  Gastroesophageal reflux disease without esophagitis  Gastroparesis  Vomiting   DEHYDRATION    Isla Pence, MD 11/14/15 1743

## 2015-11-14 ENCOUNTER — Encounter (HOSPITAL_COMMUNITY): Payer: Self-pay | Admitting: Internal Medicine

## 2015-11-14 DIAGNOSIS — E1143 Type 2 diabetes mellitus with diabetic autonomic (poly)neuropathy: Secondary | ICD-10-CM | POA: Diagnosis not present

## 2015-11-14 DIAGNOSIS — N179 Acute kidney failure, unspecified: Secondary | ICD-10-CM | POA: Diagnosis not present

## 2015-11-14 DIAGNOSIS — E1010 Type 1 diabetes mellitus with ketoacidosis without coma: Secondary | ICD-10-CM

## 2015-11-14 DIAGNOSIS — E119 Type 2 diabetes mellitus without complications: Secondary | ICD-10-CM | POA: Insufficient documentation

## 2015-11-14 DIAGNOSIS — K3184 Gastroparesis: Secondary | ICD-10-CM | POA: Diagnosis present

## 2015-11-14 DIAGNOSIS — F418 Other specified anxiety disorders: Secondary | ICD-10-CM | POA: Diagnosis not present

## 2015-11-14 DIAGNOSIS — K219 Gastro-esophageal reflux disease without esophagitis: Secondary | ICD-10-CM

## 2015-11-14 DIAGNOSIS — E785 Hyperlipidemia, unspecified: Secondary | ICD-10-CM

## 2015-11-14 LAB — COMPREHENSIVE METABOLIC PANEL
ALK PHOS: 103 U/L (ref 38–126)
ALT: 16 U/L (ref 14–54)
ANION GAP: 17 — AB (ref 5–15)
AST: 20 U/L (ref 15–41)
Albumin: 4 g/dL (ref 3.5–5.0)
BILIRUBIN TOTAL: 1.6 mg/dL — AB (ref 0.3–1.2)
BUN: 27 mg/dL — ABNORMAL HIGH (ref 6–20)
CALCIUM: 9.2 mg/dL (ref 8.9–10.3)
CO2: 15 mmol/L — ABNORMAL LOW (ref 22–32)
Chloride: 99 mmol/L — ABNORMAL LOW (ref 101–111)
Creatinine, Ser: 1.2 mg/dL — ABNORMAL HIGH (ref 0.44–1.00)
GFR, EST NON AFRICAN AMERICAN: 53 mL/min — AB (ref >60)
Glucose, Bld: 500 mg/dL — ABNORMAL HIGH (ref 65–99)
POTASSIUM: 4.6 mmol/L (ref 3.5–5.1)
Sodium: 131 mmol/L — ABNORMAL LOW (ref 135–145)
TOTAL PROTEIN: 8.2 g/dL — AB (ref 6.5–8.1)

## 2015-11-14 LAB — CBC WITH DIFFERENTIAL/PLATELET
BASOS PCT: 0 %
Basophils Absolute: 0 10*3/uL (ref 0.0–0.1)
EOS ABS: 0 10*3/uL (ref 0.0–0.7)
EOS PCT: 0 %
HCT: 32.6 % — ABNORMAL LOW (ref 36.0–46.0)
Hemoglobin: 10.2 g/dL — ABNORMAL LOW (ref 12.0–15.0)
LYMPHS ABS: 0.9 10*3/uL (ref 0.7–4.0)
Lymphocytes Relative: 9 %
MCH: 22.6 pg — AB (ref 26.0–34.0)
MCHC: 31.3 g/dL (ref 30.0–36.0)
MCV: 72.1 fL — AB (ref 78.0–100.0)
MONO ABS: 0.3 10*3/uL (ref 0.1–1.0)
Monocytes Relative: 3 %
NEUTROS PCT: 88 %
Neutro Abs: 8.8 10*3/uL — ABNORMAL HIGH (ref 1.7–7.7)
PLATELETS: 361 10*3/uL (ref 150–400)
RBC: 4.52 MIL/uL (ref 3.87–5.11)
RDW: 17.6 % — AB (ref 11.5–15.5)
WBC: 10 10*3/uL (ref 4.0–10.5)

## 2015-11-14 LAB — URINALYSIS, ROUTINE W REFLEX MICROSCOPIC
Bilirubin Urine: NEGATIVE
Glucose, UA: >1000 mg/dL — AB
LEUKOCYTES UA: NEGATIVE
Nitrite: NEGATIVE
PROTEIN: NEGATIVE mg/dL
Specific Gravity, Urine: 1.022 (ref 1.005–1.030)
pH: 6 (ref 5.0–8.0)

## 2015-11-14 LAB — GLUCOSE, CAPILLARY
GLUCOSE-CAPILLARY: 414 mg/dL — AB (ref 65–99)
GLUCOSE-CAPILLARY: 395 mg/dL — AB (ref 65–99)
GLUCOSE-CAPILLARY: 320 mg/dL — AB (ref 65–99)
GLUCOSE-CAPILLARY: 168 mg/dL — AB (ref 65–99)
GLUCOSE-CAPILLARY: 149 mg/dL — AB (ref 65–99)
GLUCOSE-CAPILLARY: 151 mg/dL — AB (ref 65–99)
GLUCOSE-CAPILLARY: 163 mg/dL — AB (ref 65–99)
GLUCOSE-CAPILLARY: 153 mg/dL — AB (ref 65–99)
GLUCOSE-CAPILLARY: 163 mg/dL — AB (ref 65–99)
Glucose-Capillary: 255 mg/dL — ABNORMAL HIGH (ref 65–99)
Glucose-Capillary: 144 mg/dL — ABNORMAL HIGH (ref 65–99)
Glucose-Capillary: 136 mg/dL — ABNORMAL HIGH (ref 65–99)
Glucose-Capillary: 144 mg/dL — ABNORMAL HIGH (ref 65–99)
Glucose-Capillary: 148 mg/dL — ABNORMAL HIGH (ref 65–99)
Glucose-Capillary: 158 mg/dL — ABNORMAL HIGH (ref 65–99)
Glucose-Capillary: 137 mg/dL — ABNORMAL HIGH (ref 65–99)

## 2015-11-14 LAB — BASIC METABOLIC PANEL
ANION GAP: 9 (ref 5–15)
Anion gap: 16 — ABNORMAL HIGH (ref 5–15)
Anion gap: 9 (ref 5–15)
BUN: 23 mg/dL — AB (ref 6–20)
BUN: 23 mg/dL — AB (ref 6–20)
BUN: 25 mg/dL — ABNORMAL HIGH (ref 6–20)
CALCIUM: 8.7 mg/dL — AB (ref 8.9–10.3)
CALCIUM: 8.3 mg/dL — AB (ref 8.9–10.3)
CHLORIDE: 110 mmol/L (ref 101–111)
CHLORIDE: 114 mmol/L — AB (ref 101–111)
CHLORIDE: 114 mmol/L — AB (ref 101–111)
CO2: 11 mmol/L — ABNORMAL LOW (ref 22–32)
CO2: 16 mmol/L — AB (ref 22–32)
CO2: 17 mmol/L — ABNORMAL LOW (ref 22–32)
CREATININE: 1.29 mg/dL — AB (ref 0.44–1.00)
CREATININE: 1.35 mg/dL — AB (ref 0.44–1.00)
Calcium: 8.1 mg/dL — ABNORMAL LOW (ref 8.9–10.3)
Creatinine, Ser: 1.15 mg/dL — ABNORMAL HIGH (ref 0.44–1.00)
GFR calc Af Amer: 56 mL/min — ABNORMAL LOW (ref >60)
GFR calc Af Amer: 53 mL/min — ABNORMAL LOW (ref >60)
GFR calc non Af Amer: 48 mL/min — ABNORMAL LOW (ref >60)
GFR calc non Af Amer: 46 mL/min — ABNORMAL LOW (ref >60)
GFR calc non Af Amer: 55 mL/min — ABNORMAL LOW (ref >60)
Glucose, Bld: 416 mg/dL — ABNORMAL HIGH (ref 65–99)
Glucose, Bld: 166 mg/dL — ABNORMAL HIGH (ref 65–99)
Glucose, Bld: 169 mg/dL — ABNORMAL HIGH (ref 65–99)
POTASSIUM: 4 mmol/L (ref 3.5–5.1)
Potassium: 3.9 mmol/L (ref 3.5–5.1)
Potassium: 4 mmol/L (ref 3.5–5.1)
SODIUM: 139 mmol/L (ref 135–145)
SODIUM: 140 mmol/L (ref 135–145)
Sodium: 137 mmol/L (ref 135–145)

## 2015-11-14 LAB — BLOOD GAS, VENOUS
ACID-BASE DEFICIT: 12.6 mmol/L — AB (ref 0.0–2.0)
BICARBONATE: 13.1 meq/L — AB (ref 20.0–24.0)
FIO2: 0.21
O2 SAT: 78.8 %
PATIENT TEMPERATURE: 98.6
PO2 VEN: 54.6 mmHg — AB (ref 31.0–45.0)
TCO2: 12.6 mmol/L (ref 0–100)
pCO2, Ven: 30.5 mmHg — ABNORMAL LOW (ref 45.0–50.0)
pH, Ven: 7.255 (ref 7.250–7.300)

## 2015-11-14 LAB — PREGNANCY, URINE: PREG TEST UR: NEGATIVE

## 2015-11-14 LAB — LIPASE, BLOOD: Lipase: 21 U/L (ref 11–51)

## 2015-11-14 LAB — URINE MICROSCOPIC-ADD ON
Squamous Epithelial / LPF: NONE SEEN
WBC, UA: NONE SEEN WBC/hpf (ref 0–5)

## 2015-11-14 LAB — MAGNESIUM: Magnesium: 2 mg/dL (ref 1.7–2.4)

## 2015-11-14 LAB — CBG MONITORING, ED
GLUCOSE-CAPILLARY: 486 mg/dL — AB (ref 65–99)
Glucose-Capillary: 477 mg/dL — ABNORMAL HIGH (ref 65–99)

## 2015-11-14 LAB — I-STAT BETA HCG BLOOD, ED (MC, WL, AP ONLY)

## 2015-11-14 LAB — MRSA PCR SCREENING: MRSA BY PCR: NEGATIVE

## 2015-11-14 MED ORDER — POLYETHYLENE GLYCOL 3350 17 G PO PACK
17.0000 g | PACK | Freq: Every day | ORAL | Status: DC | PRN
  Filled 2015-11-14: qty 1

## 2015-11-14 MED ORDER — PANTOPRAZOLE SODIUM 40 MG IV SOLR
40.0000 mg | INTRAVENOUS | Status: DC
  Administered 2015-11-14 – 2015-11-16 (×3): 40 mg via INTRAVENOUS
  Filled 2015-11-14 (×3): qty 40

## 2015-11-14 MED ORDER — SODIUM CHLORIDE 0.9 % IV BOLUS (SEPSIS): 1000.0000 mL | Freq: Once | INTRAVENOUS | Status: DC

## 2015-11-14 MED ORDER — POLYETHYLENE GLYCOL 3350 17 GM/SCOOP PO POWD: 17.0000 g | Freq: Every day | ORAL | Status: DC | PRN

## 2015-11-14 MED ORDER — SODIUM CHLORIDE 0.9 % IV SOLN
INTRAVENOUS | Status: DC
  Administered 2015-11-14: 125 mL via INTRAVENOUS

## 2015-11-14 MED ORDER — ONDANSETRON HCL 4 MG/2ML IJ SOLN
4.0000 mg | Freq: Three times a day (TID) | INTRAMUSCULAR | Status: DC | PRN
  Administered 2015-11-14 – 2015-11-15 (×3): 4 mg via INTRAVENOUS
  Filled 2015-11-14 (×3): qty 2

## 2015-11-14 MED ORDER — ONDANSETRON HCL 4 MG/2ML IJ SOLN
4.0000 mg | Freq: Once | INTRAMUSCULAR | Status: AC
  Administered 2015-11-14: 4 mg via INTRAVENOUS
  Filled 2015-11-14: qty 2

## 2015-11-14 MED ORDER — HYDRALAZINE HCL 20 MG/ML IJ SOLN: 5.0000 mg | INTRAMUSCULAR | Status: DC | PRN

## 2015-11-14 MED ORDER — SODIUM CHLORIDE 0.9 % IV SOLN
INTRAVENOUS | Status: DC
  Administered 2015-11-14: via INTRAVENOUS

## 2015-11-14 MED ORDER — METOCLOPRAMIDE HCL 5 MG/ML IJ SOLN
10.0000 mg | Freq: Four times a day (QID) | INTRAMUSCULAR | Status: DC
  Administered 2015-11-14 – 2015-11-16 (×8): 10 mg via INTRAVENOUS
  Filled 2015-11-14 (×8): qty 2

## 2015-11-14 MED ORDER — SODIUM CHLORIDE 0.9 % IV BOLUS (SEPSIS)
1000.0000 mL | Freq: Once | INTRAVENOUS | Status: AC
  Administered 2015-11-14: 1000 mL via INTRAVENOUS

## 2015-11-14 MED ORDER — ENOXAPARIN SODIUM 40 MG/0.4ML ~~LOC~~ SOLN
40.0000 mg | SUBCUTANEOUS | Status: DC
  Administered 2015-11-14 – 2015-11-15 (×2): 40 mg via SUBCUTANEOUS
  Filled 2015-11-14 (×4): qty 0.4

## 2015-11-14 MED ORDER — LORAZEPAM 2 MG/ML IJ SOLN
0.5000 mg | Freq: Once | INTRAMUSCULAR | Status: AC
  Administered 2015-11-14: 0.5 mg via INTRAVENOUS
  Filled 2015-11-14: qty 1

## 2015-11-14 MED ORDER — DOCUSATE SODIUM 100 MG PO CAPS: 100.0000 mg | ORAL_CAPSULE | Freq: Two times a day (BID) | ORAL | Status: DC

## 2015-11-14 MED ORDER — ATORVASTATIN CALCIUM 10 MG PO TABS
20.0000 mg | ORAL_TABLET | Freq: Every day | ORAL | Status: DC
  Filled 2015-11-14: qty 1

## 2015-11-14 MED ORDER — ALBUTEROL SULFATE HFA 108 (90 BASE) MCG/ACT IN AERS: 2.0000 | INHALATION_SPRAY | Freq: Four times a day (QID) | RESPIRATORY_TRACT | Status: DC | PRN

## 2015-11-14 MED ORDER — SODIUM CHLORIDE 0.9 % IV SOLN
INTRAVENOUS | Status: DC
  Administered 2015-11-14: 4.3 [IU]/h via INTRAVENOUS
  Filled 2015-11-14: qty 2.5

## 2015-11-14 MED ORDER — METOCLOPRAMIDE HCL 5 MG/ML IJ SOLN
10.0000 mg | Freq: Three times a day (TID) | INTRAMUSCULAR | Status: DC
  Administered 2015-11-14: 10 mg via INTRAVENOUS
  Filled 2015-11-14: qty 2

## 2015-11-14 MED ORDER — PREGABALIN 75 MG PO CAPS
150.0000 mg | ORAL_CAPSULE | Freq: Two times a day (BID) | ORAL | Status: DC
  Administered 2015-11-14 – 2015-11-16 (×5): 150 mg via ORAL
  Filled 2015-11-14 (×5): qty 2

## 2015-11-14 MED ORDER — FLUOXETINE HCL 20 MG PO CAPS
40.0000 mg | ORAL_CAPSULE | Freq: Every day | ORAL | Status: DC
  Administered 2015-11-14 – 2015-11-16 (×3): 40 mg via ORAL
  Filled 2015-11-14 (×3): qty 2

## 2015-11-14 MED ORDER — SODIUM CHLORIDE 0.9 % IV SOLN
INTRAVENOUS | Status: DC
  Filled 2015-11-14: qty 2.5

## 2015-11-14 MED ORDER — DEXTROSE-NACL 5-0.45 % IV SOLN
INTRAVENOUS | Status: DC
  Administered 2015-11-14 – 2015-11-15 (×5): via INTRAVENOUS

## 2015-11-14 MED ORDER — DEXTROSE-NACL 5-0.45 % IV SOLN
INTRAVENOUS | Status: DC
  Administered 2015-11-14: 04:00:00 via INTRAVENOUS

## 2015-11-14 MED ORDER — POTASSIUM CHLORIDE 10 MEQ/100ML IV SOLN: 10.0000 meq | INTRAVENOUS | Status: AC

## 2015-11-14 MED ORDER — ALBUTEROL SULFATE (2.5 MG/3ML) 0.083% IN NEBU: 2.5000 mg | INHALATION_SOLUTION | Freq: Four times a day (QID) | RESPIRATORY_TRACT | Status: DC | PRN

## 2015-11-14 MED ORDER — LORATADINE 10 MG PO TABS
10.0000 mg | ORAL_TABLET | Freq: Every day | ORAL | Status: DC
  Administered 2015-11-14 – 2015-11-16 (×3): 10 mg via ORAL
  Filled 2015-11-14 (×3): qty 1

## 2015-11-14 MED ORDER — BUSPIRONE HCL 5 MG PO TABS
5.0000 mg | ORAL_TABLET | Freq: Three times a day (TID) | ORAL | Status: DC
  Administered 2015-11-14 – 2015-11-16 (×7): 5 mg via ORAL
  Filled 2015-11-14 (×8): qty 1

## 2015-11-14 MED ORDER — SODIUM CHLORIDE 0.9 % IV SOLN: INTRAVENOUS | Status: DC

## 2015-11-14 MED ORDER — MORPHINE SULFATE (PF) 2 MG/ML IV SOLN: 2.0000 mg | INTRAVENOUS | Status: DC | PRN

## 2015-11-14 MED ORDER — FERROUS FUMARATE 324 (106 FE) MG PO TABS
1.0000 | ORAL_TABLET | Freq: Two times a day (BID) | ORAL | Status: DC
  Filled 2015-11-14 (×2): qty 1

## 2015-11-14 NOTE — Progress Notes (Signed)
Initial Nutrition Assessment  DOCUMENTATION CODES:   Not applicable  INTERVENTION:  When diet advanced, order Boost Breeze BID. Each supplement provides 250 kcals and 9 grams of protein.  NUTRITION DIAGNOSIS:   Inadequate oral intake related to poor appetite, nausea as evidenced by per patient/family report.  GOAL:   Patient will meet greater than or equal to 90% of their needs  MONITOR:   PO intake, Supplement acceptance, Diet advancement, Labs, Weight trends, Skin, I & O's  REASON FOR ASSESSMENT:   Malnutrition Screening Tool    ASSESSMENT:   Pt with medical history significant of poorly controlled type 1 diabetes, hypertension, hyperlipidemia, gastroparesis, GERD, depression, who presents with nausea, vomiting, abdominal pain or diarrhea.  Pt feeling nauseas at time of visit. Pt reports eating TID PTA. She reports N/V for past 48 hours but no N/V prior to this time frame. Pt had been eating normally with no decrease in intake. Pt reports good appetite.   Per pt, she has not experienced any weight loss. Pt reports UBW of 143-145 lbs. Current weight is 136 lbs. Pt weight has fluctuated between 111-141 lbs since 2/17.   Pt does not drink nutrition supplements at home but would like to receive Boost Breeze. Pt was more amenable to trying a juice vs an Ensure or glucerna. Per physician note, diet will be advanced once gastroparesis flair up is resolved.  NFPE: No fat depletion, no muscle depletion, no edema.  Labs reviewed; 168-486, Cl 114, BUN 23, creat 1.35, Ca 8.3, GFR 53. Meds reviewed; D5 125 ml/hr (510 kcals), Reglan 10 mg every 6 hours.  Diet Order:  Diet NPO time specified Except for: Ice Chips, Sips with Meds  Skin:  Reviewed, no issues  Last BM:  5/22  Height:   Ht Readings from Last 1 Encounters:  11/14/15 5\' 2"  (1.575 m)    Weight:   Wt Readings from Last 1 Encounters:  11/14/15 136 lb 11 oz (62 kg)    Ideal Body Weight:  50 kg  BMI:  Body mass  index is 24.99 kg/(m^2).  Estimated Nutritional Needs:   Kcal:  1500-1700   Protein:  80-90  Fluid:  1.5-1.7 L  EDUCATION NEEDS:   No education needs identified at this time  Geoffery Lyons, [place] Dietetic Intern Pager [phone]

## 2015-11-14 NOTE — H&P (Signed)
History and Physical    Declan Kinstler [id number] DOB: [birth year] DOA: 11/13/2015  Referring MD/NP/PA:   PCP: [name], MD   Patient coming from:  The patient is coming from home.  At baseline, she is independent for most of her ADL.   Chief Complaint: Nausea, vomiting, abdominal pain and diarrhea  HPI: [name] is a 50 y.o. female with medical history significant of poorly controlled type 1 diabetes, hypertension, hyperlipidemia, gastroparesis, GERD, depression, who presents with nausea, vomiting, abdominal pain or diarrhea.  Patient reports that she started having nausea, vomiting, abdominal pain or diarrhea since Thursday, which has been progressively getting worse. She vomited more than 20 times without blood in the vomitus today. Her abdominal pain is diffuse, constant, 10 out of 10 in severity, nonradiating. She has 3-4 times of bowel movement with loose stool, but she is on MiraLAX and Colace at home. Patient does not have chest pain, cough, fever, chills, shortness breath, symptoms of UTI or unilateral weakness.  ED Course: pt was found to have elevated anion gap of 17, bicarbonate 15, potassium 4.6, acute renal injury 1.20, WBC 10.0, negative urinalysis, temperature normal, tachycardia, tachypnea. Patient is placed on stepdown bed for observation.  Review of Systems:   General: no fevers, chills, no changes in body weight, has poor appetite, has fatigue HEENT: no blurry vision, hearing changes or sore throat Pulm: no dyspnea, coughing, wheezing CV: no chest pain, no palpitations Abd: has nausea, vomiting, abdominal pain, diarrhea, no constipation GU: no dysuria, burning on urination, increased urinary frequency, hematuria  Ext: no leg edema Neuro: no unilateral weakness, numbness, or tingling, no vision change or hearing loss Skin: no rash MSK: No muscle spasm, no deformity, no limitation of range of movement in spin Heme: No easy bruising.  Travel history: No  recent long distant travel.  Allergy:  Allergies  Allergen Reactions  . Anesthetics, Amide Nausea And Vomiting  . Penicillins Diarrhea and Nausea And Vomiting    Has patient had a PCN reaction causing immediate rash, facial/tongue/throat swelling, SOB or lightheadedness with hypotension: No Has patient had a PCN reaction causing severe rash involving mucus membranes or skin necrosis: No Has patient had a PCN reaction that required hospitalization No Has patient had a PCN reaction occurring within the last 10 years: Yes  If all of the above answers are "NO", then may proceed with Cephalosporin use.   . Buprenorphine Hcl Rash  . Encainide Nausea And Vomiting    Past Medical History  Diagnosis Date  . Diabetes mellitus without complication ([place])   . Essential hypertension   . HLD (hyperlipidemia)   . GERD (gastroesophageal reflux disease)   . Depression   . Gastroparesis     Past Surgical History  Procedure Laterality Date  . Retinal detachment surgery    . Posterior vitrectomy and membrane peel-left eye  09/28/2002  . Posterior vitrectomy and membrane peel-right eye  03/16/2002  . Eye surgery    . Gailstones    . Esophagogastroduodenoscopy  09/27/2014    at [facility], Dr Rolan Lipa. biospy neg for celiac, neg for H pylori.   . Colonoscopy  09/27/2014    at [facility]    Social History:  reports that she has never smoked. She has never used smokeless tobacco. She reports that she does not drink alcohol or use illicit drugs.  Family History:  Family History  Problem Relation Age of Onset  . Cystic fibrosis Mother   . Hypertension Father   . Diabetes  Brother   . Hypertension Maternal Grandmother      Prior to Admission medications   Medication Sig Start Date End Date Taking? Authorizing Provider  Ferrous Fumarate (HEMOCYTE - 106 MG FE) 324 (106 Fe) MG TABS tablet Take 1 tablet (106 mg of iron total) by mouth 2 (two) times daily. 09/05/15  Yes Modena Jansky, MD  FLUoxetine  (PROZAC) 40 MG capsule Take 40 mg by mouth daily.   Yes Historical Provider, MD  glucose blood (TRUE METRIX BLOOD GLUCOSE TEST) test strip Use 3 times daily before meals 07/19/15  Yes [name], MD  insulin aspart (NOVOLOG) 100 UNIT/ML injection Inject 0-15 Units into the skin 3 (three) times daily with meals. Per sliding scale 10/25/15  Yes [name], MD  insulin detemir (LEVEMIR) 100 UNIT/ML injection Inject 0.25 mLs (25 Units total) into the skin daily. 10/25/15  Yes [name], MD  Insulin Syringe-Needle U-100 (BD INSULIN SYRINGE ULTRAFINE) 31G X 15/64" 0.5 ML MISC 1 each by Does not apply route 4 (four) times daily as needed. 08/16/15  Yes [name], MD  lisinopril (PRINIVIL,ZESTRIL) 5 MG tablet Take 1 tablet (5 mg total) by mouth daily. 09/20/15  Yes [name], MD  ondansetron (ZOFRAN ODT) 4 MG disintegrating tablet Take 1 tablet (4 mg total) by mouth every 8 (eight) hours as needed for nausea. 07/19/15  Yes [name], MD  pantoprazole (PROTONIX) 40 MG tablet Take 40 mg by mouth daily. Reported on 07/19/2015 01/04/15  Yes Historical Provider, MD  polyethylene glycol powder (GLYCOLAX/MIRALAX) powder Take 17 g by mouth daily as needed for moderate constipation. Reported on 07/19/2015 04/06/15  Yes Historical Provider, MD  pregabalin (LYRICA) 150 MG capsule Take 1 capsule (150 mg total) by mouth 2 (two) times daily. 10/25/15  Yes [name], MD  promethazine (PHENERGAN) 25 MG suppository Place 1 suppository (25 mg total) rectally every 6 (six) hours as needed for nausea or vomiting. 09/20/15  Yes [name], MD  TRUEPLUS LANCETS 28G MISC 1 each by Does not apply route 3 (three) times daily before meals. 07/19/15  Yes [name], MD  albuterol (PROVENTIL HFA;VENTOLIN HFA) 108 (90 Base) MCG/ACT inhaler Inhale 2 puffs into the lungs every 6 (six) hours as needed for wheezing or shortness of breath. 10/20/15   [name], MD  atorvastatin (LIPITOR) 20 MG tablet Take 1 tablet (20 mg total) by  mouth daily at 6 PM. 06/25/15   Allie Bossier, MD  Blood Glucose Monitoring Suppl (TRUE METRIX METER) DEVI 1 each by Does not apply route 3 (three) times daily before meals. 07/19/15   [name], MD  busPIRone (BUSPAR) 5 MG tablet Take 1 tablet (5 mg total) by mouth 3 (three) times daily. 10/20/15   [name], MD  cetirizine (ZYRTEC) 10 MG tablet Take 1 tablet (10 mg total) by mouth daily. 10/20/15   [name], MD  docusate sodium (COLACE) 100 MG capsule Take 1 capsule (100 mg total) by mouth 2 (two) times daily. 09/05/15   Modena Jansky, MD  feeding supplement, GLUCERNA SHAKE, (GLUCERNA SHAKE) LIQD Take 237 mLs by mouth 3 (three) times daily between meals. 09/05/15   Modena Jansky, MD    Physical Exam: Filed Vitals:   11/14/15 0114 11/14/15 0130 11/14/15 0200 11/14/15 0300  BP: 163/77 146/53 142/71 170/84  Pulse: 107 101 105 114  Temp:      TempSrc:      Resp: 28 27 18 21   SpO2: 100% 91% 100% 100%  General: has acute distress HEENT:       Eyes: PERRL, EOMI, no scleral icterus.       ENT: No discharge from the ears and nose, no pharynx injection, no tonsillar enlargement.        Neck: No JVD, no bruit, no mass felt. Heme: No neck lymph node enlargement. Cardiac: S1/S2, RRR, tachycardia, No murmurs, No gallops or rubs. Pulm: No rales, wheezing, rhonchi or rubs. Abd: Soft, nondistended, diffusely tender, no rebound pain, no organomegaly, BS present. GU: No hematuria Ext: No pitting leg edema bilaterally. 2+DP/PT pulse bilaterally. Musculoskeletal: No joint deformities, No joint redness or warmth, no limitation of ROM in spin. Skin: No rashes.  Neuro: Alert, oriented X3, cranial nerves II-XII grossly intact, moves all extremities normally. Psych: Patient is not psychotic, no suicidal or hemocidal ideation.  Labs on Admission: I have personally reviewed following labs and imaging studies  CBC:  Recent Labs Lab 11/14/15 0050  WBC 10.0  NEUTROABS 8.8*  HGB 10.2*  HCT  32.6*  MCV 72.1*  PLT [id number]   Basic Metabolic Panel:  Recent Labs Lab 11/14/15 0050  NA 131*  K 4.6  CL 99*  CO2 15*  GLUCOSE 500*  BUN 27*  CREATININE 1.20*  CALCIUM 9.2   GFR: CrCl cannot be calculated (Unknown ideal weight.). Liver Function Tests:  Recent Labs Lab 11/14/15 0050  AST 20  ALT 16  ALKPHOS 103  BILITOT 1.6*  PROT 8.2*  ALBUMIN 4.0    Recent Labs Lab 11/14/15 0050  LIPASE 21   No results for input(s): AMMONIA in the last 168 hours. Coagulation Profile: No results for input(s): INR, PROTIME in the last 168 hours. Cardiac Enzymes: No results for input(s): CKTOTAL, CKMB, CKMBINDEX, TROPONINI in the last 168 hours. BNP (last 3 results) No results for input(s): PROBNP in the last 8760 hours. HbA1C: No results for input(s): HGBA1C in the last 72 hours. CBG:  Recent Labs Lab 11/13/15 2240 11/14/15 0229 11/14/15 0333  GLUCAP 353* 477* 486*   Lipid Profile: No results for input(s): CHOL, HDL, LDLCALC, TRIG, CHOLHDL, LDLDIRECT in the last 72 hours. Thyroid Function Tests: No results for input(s): TSH, T4TOTAL, FREET4, T3FREE, THYROIDAB in the last 72 hours. Anemia Panel: No results for input(s): VITAMINB12, FOLATE, FERRITIN, TIBC, IRON, RETICCTPCT in the last 72 hours. Urine analysis:    Component Value Date/Time   COLORURINE YELLOW 11/14/2015 0232   APPEARANCEUR CLEAR 11/14/2015 0232   LABSPEC 1.022 11/14/2015 0232   PHURINE 6.0 11/14/2015 0232   GLUCOSEU >1000* 11/14/2015 0232   HGBUR LARGE* 11/14/2015 0232   BILIRUBINUR NEGATIVE 11/14/2015 0232   BILIRUBINUR NEGATIVE 08/04/2012 1005   KETONESUR >80* 11/14/2015 0232   PROTEINUR NEGATIVE 11/14/2015 0232   PROTEINUR >=300 08/04/2012 1005   UROBILINOGEN 0.2 04/27/2015 1608   UROBILINOGEN 0.2 08/04/2012 1005   NITRITE NEGATIVE 11/14/2015 0232   NITRITE POSITIVE 08/04/2012 1005   LEUKOCYTESUR NEGATIVE 11/14/2015 0232   Sepsis Labs: @LABRCNTIP (procalcitonin:4,lacticidven:4) )No  results found for this or any previous visit (from the past 240 hour(s)).   Radiological Exams on Admission: No results found.   EKG: Not done in ED, will get one.   Assessment/Plan Principal Problem:   Diabetic ketoacidosis without coma associated with type 1 diabetes mellitus (HCC) Active Problems:   Nausea & vomiting   Diabetic neuropathy, type I diabetes mellitus ([place])   Anxiety and depression   Diabetic gastroparesis ([place])   DKA (diabetic ketoacidoses) ([place])   AKI (acute kidney injury) ([place])  HLD (hyperlipidemia)   GERD (gastroesophageal reflux disease)   Gastroparesis   Diabetic ketoacidosis without coma associated with type 1 diabetes mellitus ([place]): Anion gap 17, bicarbonate 15. Patient is hemodynamically stable. Mental status okay.  - Admit to stepdown for obs - give 3L of NS bolus - start DKA protocol with BMP q4h - IVF: NS 125 cc/h; will switch to D5-1/2NS when CBG<250 - replete K as needed - Zofran and relgan prn nausea  - NPO   Nausea, vomiting, abdominal pain: Most likely due to gastroparesis. -IV Reglan and Zofran  Diarrhea: Patient is on laxatives including Colace and MiraLAX. -Hold laxatives. If still has diarrhea, will need check C diff PCR  DM-I: Last A1c 8.7 on 07/29/15, poorly controled. Patient is taking Levemir at home. Now has DKA -On DKA protocol  AKI: Creatinine 1.20. Likely due to prerenal secondary to dehydration and continuation of ACEI - IVF as above - Check FeNa - Follow up renal function by BMP - Hold her lisinopril    Hypertension: -Hold lisinopril due to acute renal injury -IV hydralazine when necessary  GERD: -Protonix IV  HLD: Last LDL was 116 on 04/04/12 -Continue home medications: Lipitor  Depression and anxiety: No suicidal or homicidal ideations. -Continue home medications: BuSpar, Prozac  DVT ppx: SQ Lovenox Code Status: Full code Family Communication: None at bed side.  Disposition Plan:  Anticipate discharge  back to previous home environment Consults called:  none Admission status: SDU/obs  Date of Service 11/14/2015    Ivor Costa [facility] Pager [phone]  If 7PM-7AM, please contact night-coverage [email or web address] Password [email or web address] 11/14/2015, 4:12 AM

## 2015-11-14 NOTE — Progress Notes (Signed)
PROGRESS NOTE        PATIENT DETAILS Name: [name] Age: 50 y.o. Sex: female Date of Birth: [birth year] Admit Date: 11/13/2015 Admitting Physician Ivor Costa, MD [id number], Jarold Song, MD  Brief Narrative: Patient is a 50 y.o. female with PMHx of DM-1, Gastroparesis presented with DKA and presume gastroparesis flare.   Subjective: Still nauseous-but no vomiting this am. Last BM sometime last night. No chest pain or abd pain.  Assessment/Plan: Principal Problem: Diabetic ketoacidosis without coma associated with type 1 diabetes mellitus: continue IV Insulin infusion and IVF. Follow anion gap. Monitor in SDU.  Active Problems: Gastroparesis flare:continue IV Reglan and other supportive measures, once more improved will advance diet.   [id number] mild pre-renal azotemia from GI loss-continue IVF hydration and follow  [id number] controlled-continue prn hydralazine-once BP improves further will restart Lisinopril  Dyslipidemia: hold statins-resume when oral intake more stable  Microcytic Anemia: most recent Fe panel-consistent with Fe deficiency-resume oral Fe supplementation when intake more stable.Hb currently stable-will expect some drop with acute illness and IVF.  GERD:continue PPI  Depression and Anxiety: seems very pleasant at this time-continue Prozac and Buspar  DVT Prophylaxis: Prophylactic Lovenox  Code Status: Full code   Family Communication: None at bedside  Disposition Plan: Remain inpatient-remain in SDU-home in next 1-2 days-depending on clinical improvement  Antimicrobial agents: None  Procedures: None  CONSULTS:  None  Time spent: 25 minutes-Greater than 50% of this time was spent in counseling, explanation of diagnosis, planning of further management, and coordination of care.  MEDICATIONS: Anti-infectives    None      Scheduled Meds: . atorvastatin  20 mg Oral q1800  . busPIRone  5 mg Oral TID  .  enoxaparin (LOVENOX) injection  40 mg Subcutaneous Q24H  . Ferrous Fumarate  1 tablet Oral BID  . FLUoxetine  40 mg Oral Daily  . loratadine  10 mg Oral Daily  . metoCLOPramide (REGLAN) injection  10 mg Intravenous Q8H  . pantoprazole (PROTONIX) IV  40 mg Intravenous Q24H  . pregabalin  150 mg Oral BID  . sodium chloride  1,000 mL Intravenous Once   Continuous Infusions: . sodium chloride    . sodium chloride 125 mL (11/14/15 0530)  . dextrose 5 % and 0.45% NaCl Stopped (11/14/15 0554)  . insulin (NOVOLIN-R) infusion 5.2 Units/hr (11/14/15 0754)   PRN Meds:.albuterol, hydrALAZINE, morphine injection, ondansetron   PHYSICAL EXAM: Vital signs: Filed Vitals:   11/14/15 0400 11/14/15 0600 11/14/15 0700 11/14/15 0800  BP: 153/68 164/97 103/35 122/47  Pulse: 108 112 104 106  Temp:  98.5 F (36.9 C)    TempSrc:  Oral    Resp: 26 15 17 15   Height:  5\' 2"  (1.575 m)    Weight:  62 kg (136 lb 11 oz)    SpO2: 100% 100% 100% 100%   Filed Weights   11/14/15 0600  Weight: 62 kg (136 lb 11 oz)   Body mass index is 24.99 kg/(m^2).   Gen Exam: Awake and alert with clear speech. Not in any distress  Neck: Supple, No JVD.   Chest: B/L Clear.   CVS: S1 S2 Regular, no murmurs.  Abdomen: soft, BS +, non tender, non distended.  Extremities: no edema, lower extremities warm to touch. Neurologic: Non Focal.  Skin: No Rash or lesions   Wounds: N/A.   LABORATORY DATA: CBC:  Recent Labs Lab 11/14/15 0050  WBC 10.0  NEUTROABS 8.8*  HGB 10.2*  HCT 32.6*  MCV 72.1*  PLT [id number]    Basic Metabolic Panel:  Recent Labs Lab 11/14/15 0050 11/14/15 0640  NA 131* 137  K 4.6 3.9  CL 99* 110  CO2 15* 11*  GLUCOSE 500* 416*  BUN 27* 23*  CREATININE 1.20* 1.29*  CALCIUM 9.2 8.7*    GFR: Estimated Creatinine Clearance: 46.2 mL/min (by C-G formula based on Cr of 1.29).  Liver Function Tests:  Recent Labs Lab 11/14/15 0050  AST 20  ALT 16  ALKPHOS 103  BILITOT 1.6*  PROT 8.2*    ALBUMIN 4.0    Recent Labs Lab 11/14/15 0050  LIPASE 21   No results for input(s): AMMONIA in the last 168 hours.  Coagulation Profile: No results for input(s): INR, PROTIME in the last 168 hours.  Cardiac Enzymes: No results for input(s): CKTOTAL, CKMB, CKMBINDEX, TROPONINI in the last 168 hours.  BNP (last 3 results) No results for input(s): PROBNP in the last 8760 hours.  HbA1C: No results for input(s): HGBA1C in the last 72 hours.  CBG:  Recent Labs Lab 11/14/15 0229 11/14/15 0333 11/14/15 0534 11/14/15 0647 11/14/15 0752  GLUCAP 477* 486* 414* 395* 320*    Lipid Profile: No results for input(s): CHOL, HDL, LDLCALC, TRIG, CHOLHDL, LDLDIRECT in the last 72 hours.  Thyroid Function Tests: No results for input(s): TSH, T4TOTAL, FREET4, T3FREE, THYROIDAB in the last 72 hours.  Anemia Panel: No results for input(s): VITAMINB12, FOLATE, FERRITIN, TIBC, IRON, RETICCTPCT in the last 72 hours.  Urine analysis:    Component Value Date/Time   COLORURINE YELLOW 11/14/2015 0232   APPEARANCEUR CLEAR 11/14/2015 0232   LABSPEC 1.022 11/14/2015 0232   PHURINE 6.0 11/14/2015 0232   GLUCOSEU >1000* 11/14/2015 0232   HGBUR LARGE* 11/14/2015 0232   BILIRUBINUR NEGATIVE 11/14/2015 0232   BILIRUBINUR NEGATIVE 08/04/2012 1005   KETONESUR >80* 11/14/2015 0232   PROTEINUR NEGATIVE 11/14/2015 0232   PROTEINUR >=300 08/04/2012 1005   UROBILINOGEN 0.2 04/27/2015 1608   UROBILINOGEN 0.2 08/04/2012 1005   NITRITE NEGATIVE 11/14/2015 0232   NITRITE POSITIVE 08/04/2012 1005   LEUKOCYTESUR NEGATIVE 11/14/2015 0232    Sepsis Labs: Lactic Acid, Venous    Component Value Date/Time   LATICACIDVEN 3.8* 07/29/2015 0930    MICROBIOLOGY: Recent Results (from the past 240 hour(s))  MRSA PCR Screening     Status: None   Collection Time: 11/14/15  5:31 AM  Result Value Ref Range Status   MRSA by PCR NEGATIVE NEGATIVE Final    Comment:        The GeneXpert MRSA Assay  (FDA approved for NASAL specimens only), is one component of a comprehensive MRSA colonization surveillance program. It is not intended to diagnose MRSA infection nor to guide or monitor treatment for MRSA infections.     RADIOLOGY STUDIES/RESULTS: No results found.     Oren Binet, MD  [facility] Pager:[phone]  If 7PM-7AM, please contact night-coverage [email or web address] Password [facility] 11/14/2015, 8:52 AM

## 2015-11-14 NOTE — Progress Notes (Signed)
MD aware that iv access unsuccessful.  MD stated, "I will put in a central line.Marland Kitchen"

## 2015-11-14 NOTE — ED Provider Notes (Signed)
Patient signed out to me to follow-up on lab work. Patient presented with abdominal pain, nausea and vomiting that began earlier tonight. She does have a history of gastroparesis. Symptoms are similar to previous presentations with gastroparesis and diabetic ketoacidosis.  Patient's lab work is consistent with DKA. Abdominal exam is benign and does not raise question for acute surgical process. Patient will require hospitalization for treatment of DKA and gastroparesis.  Results for orders placed or performed during the hospital encounter of 11/13/15  Lipase, blood  Result Value Ref Range   Lipase 21 11 - 51 U/L  Comprehensive metabolic panel  Result Value Ref Range   Sodium 131 (L) 135 - 145 mmol/L   Potassium 4.6 3.5 - 5.1 mmol/L   Chloride 99 (L) 101 - 111 mmol/L   CO2 15 (L) 22 - 32 mmol/L   Glucose, Bld 500 (H) 65 - 99 mg/dL   BUN 27 (H) 6 - 20 mg/dL   Creatinine, Ser 1.20 (H) 0.44 - 1.00 mg/dL   Calcium 9.2 8.9 - 10.3 mg/dL   Total Protein 8.2 (H) 6.5 - 8.1 g/dL   Albumin 4.0 3.5 - 5.0 g/dL   AST 20 15 - 41 U/L   ALT 16 14 - 54 U/L   Alkaline Phosphatase 103 38 - 126 U/L   Total Bilirubin 1.6 (H) 0.3 - 1.2 mg/dL   GFR calc non Af Amer 53 (L) >60 mL/min   GFR calc Af Amer >60 >60 mL/min   Anion gap 17 (H) 5 - 15  Urinalysis, Routine w reflex microscopic  Result Value Ref Range   Color, Urine YELLOW YELLOW   APPearance CLEAR CLEAR   Specific Gravity, Urine 1.022 1.005 - 1.030   pH 6.0 5.0 - 8.0   Glucose, UA >1000 (A) NEGATIVE mg/dL   Hgb urine dipstick LARGE (A) NEGATIVE   Bilirubin Urine NEGATIVE NEGATIVE   Ketones, ur >80 (A) NEGATIVE mg/dL   Protein, ur NEGATIVE NEGATIVE mg/dL   Nitrite NEGATIVE NEGATIVE   Leukocytes, UA NEGATIVE NEGATIVE  CBC with Differential  Result Value Ref Range   WBC 10.0 4.0 - 10.5 K/uL   RBC 4.52 3.87 - 5.11 MIL/uL   Hemoglobin 10.2 (L) 12.0 - 15.0 g/dL   HCT 32.6 (L) 36.0 - 46.0 %   MCV 72.1 (L) 78.0 - 100.0 fL   MCH 22.6 (L) 26.0 -  34.0 pg   MCHC 31.3 30.0 - 36.0 g/dL   RDW 17.6 (H) 11.5 - 15.5 %   Platelets 361 150 - 400 K/uL   Neutrophils Relative % 88 %   Lymphocytes Relative 9 %   Monocytes Relative 3 %   Eosinophils Relative 0 %   Basophils Relative 0 %   Neutro Abs 8.8 (H) 1.7 - 7.7 K/uL   Lymphs Abs 0.9 0.7 - 4.0 K/uL   Monocytes Absolute 0.3 0.1 - 1.0 K/uL   Eosinophils Absolute 0.0 0.0 - 0.7 K/uL   Basophils Absolute 0.0 0.0 - 0.1 K/uL   Smear Review MORPHOLOGY UNREMARKABLE   Blood gas, venous  Result Value Ref Range   FIO2 0.21    pH, Ven 7.255 7.250 - 7.300   pCO2, Ven 30.5 (L) 45.0 - 50.0 mmHg   pO2, Ven 54.6 (H) 31.0 - 45.0 mmHg   Bicarbonate 13.1 (L) 20.0 - 24.0 mEq/L   TCO2 12.6 0 - 100 mmol/L   Acid-base deficit 12.6 (H) 0.0 - 2.0 mmol/L   O2 Saturation 78.8 %   Patient temperature 98.6  Collection site VEIN    Drawn by Moss Mc, RN    Sample type VEIN   Urine microscopic-add on  Result Value Ref Range   Squamous Epithelial / LPF NONE SEEN NONE SEEN   WBC, UA NONE SEEN 0 - 5 WBC/hpf   RBC / HPF TOO NUMEROUS TO COUNT 0 - 5 RBC/hpf   Bacteria, UA RARE (A) NONE SEEN  I-Stat Beta hCG blood, ED ([facility], [facility], [facility] only)  Result Value Ref Range   I-stat hCG, quantitative <5.0 <5 mIU/mL   Comment 3          CBG monitoring, ED  Result Value Ref Range   Glucose-Capillary 353 (H) 65 - 99 mg/dL  CBG monitoring, ED  Result Value Ref Range   Glucose-Capillary 477 (H) 65 - 99 mg/dL   No results found.    Orpah Greek, MD 11/14/15 [phone]

## 2015-11-14 NOTE — Progress Notes (Signed)
Inpatient Diabetes Program Recommendations  AACE/ADA: New Consensus Statement on Inpatient Glycemic Control (2015)  Target Ranges:  Prepandial:   less than 140 mg/dL      Peak postprandial:   less than 180 mg/dL (1-2 hours)      Critically ill patients:  140 - 180 mg/dL   Review of Glycemic Control  Diabetes history: DM type 35 since 50 years old Outpatient Diabetes medications: levemir 5 units and novolog slding scale of insulin of 0-15 units tidwc (includes meal coverage and correction) Current orders for Inpatient glycemic control: IV insulin drip per DKA order set.  Recommendations: Spoke with patient although with continuous nausea, it was difficult to speak with her at any length. This diabetes coordinator asked patient if she took her insulin when she was unable to eat. She stated she did not because she is 'afraid of going low'. I asked patient if she gave her levemir when she was sick, and she shook her head that she did not. I explained to her that she needed to take her basal levemir even though she was sick and that this insulin was not covering her food intake. She was still feeling extremely nauseous, thus I did not push further with this notion.  I mentioned that she take a correction scale of insulin that did not cover her food intake but just covered her blood sugar. If she did in fact eat, she could take low dose meal coverage after she ate and was sure she could digest the meal. My recommendation is to give her an insulin plan of which she would not be afraid as the following:  When she is not able to eat, to take 20 units levemir that day or evening, and use a very sensitive custom correction scale that begins with 1 unit at 150, 2 units at 200, 3 units at 250, 4 units at 300, 6 units at 350, 8 units at 400 mg/dL. Then if patient can eat and digest the meal, she could cover the meal with 2-3 units after the meal (to ensure she will not vomit the meal).  This regimen would at  least help to keep her out of ketoacidosis. She will need encouragement to take her insulins, as she is extremely fearful of hypoglycemia.  Once patient is ready to transition off the drip, may want to start her levemir at 20 units 2 hrs prior to discontinuation and begin the sensitive correction tid and HS (if can't eat, can use correction q 4 hrs.) I am glad to assist with teaching patient how to use this regimen-will talk with diabetes coordinator who will be here tomorrow to assist as well.  Thank you Rosita Kea, RN, MSN, CDE  Diabetes Inpatient Program Office: [phone] Pager: [phone] 8:00 am to 5:00 pm

## 2015-11-15 ENCOUNTER — Observation Stay (HOSPITAL_COMMUNITY): Payer: 59

## 2015-11-15 DIAGNOSIS — K3184 Gastroparesis: Secondary | ICD-10-CM

## 2015-11-15 DIAGNOSIS — E1010 Type 1 diabetes mellitus with ketoacidosis without coma: Secondary | ICD-10-CM | POA: Diagnosis not present

## 2015-11-15 DIAGNOSIS — E1143 Type 2 diabetes mellitus with diabetic autonomic (poly)neuropathy: Secondary | ICD-10-CM | POA: Diagnosis not present

## 2015-11-15 DIAGNOSIS — F418 Other specified anxiety disorders: Secondary | ICD-10-CM | POA: Diagnosis not present

## 2015-11-15 DIAGNOSIS — N179 Acute kidney failure, unspecified: Secondary | ICD-10-CM | POA: Diagnosis not present

## 2015-11-15 LAB — GLUCOSE, CAPILLARY
GLUCOSE-CAPILLARY: 125 mg/dL — AB (ref 65–99)
GLUCOSE-CAPILLARY: 154 mg/dL — AB (ref 65–99)
GLUCOSE-CAPILLARY: 155 mg/dL — AB (ref 65–99)
GLUCOSE-CAPILLARY: 161 mg/dL — AB (ref 65–99)
GLUCOSE-CAPILLARY: 166 mg/dL — AB (ref 65–99)
GLUCOSE-CAPILLARY: 175 mg/dL — AB (ref 65–99)
GLUCOSE-CAPILLARY: 180 mg/dL — AB (ref 65–99)
GLUCOSE-CAPILLARY: 183 mg/dL — AB (ref 65–99)
GLUCOSE-CAPILLARY: 209 mg/dL — AB (ref 65–99)
GLUCOSE-CAPILLARY: 209 mg/dL — AB (ref 65–99)
GLUCOSE-CAPILLARY: 213 mg/dL — AB (ref 65–99)
Glucose-Capillary: 167 mg/dL — ABNORMAL HIGH (ref 65–99)
Glucose-Capillary: 155 mg/dL — ABNORMAL HIGH (ref 65–99)
Glucose-Capillary: 157 mg/dL — ABNORMAL HIGH (ref 65–99)
Glucose-Capillary: 140 mg/dL — ABNORMAL HIGH (ref 65–99)

## 2015-11-15 LAB — BASIC METABOLIC PANEL
ANION GAP: 7 (ref 5–15)
BUN: 17 mg/dL (ref 6–20)
CALCIUM: 7.7 mg/dL — AB (ref 8.9–10.3)
CO2: 19 mmol/L — AB (ref 22–32)
CREATININE: 1.18 mg/dL — AB (ref 0.44–1.00)
Chloride: 112 mmol/L — ABNORMAL HIGH (ref 101–111)
GFR calc Af Amer: >60 mL/min (ref >60)
GFR, EST NON AFRICAN AMERICAN: 54 mL/min — AB (ref >60)
GLUCOSE: 151 mg/dL — AB (ref 65–99)
Potassium: 3.3 mmol/L — ABNORMAL LOW (ref 3.5–5.1)
Sodium: 138 mmol/L (ref 135–145)

## 2015-11-15 LAB — SODIUM, URINE, RANDOM: SODIUM UR: 81 mmol/L

## 2015-11-15 LAB — MAGNESIUM: Magnesium: 2 mg/dL (ref 1.7–2.4)

## 2015-11-15 LAB — CREATININE, URINE, RANDOM: CREATININE, URINE: 94.68 mg/dL

## 2015-11-15 MED ORDER — CETYLPYRIDINIUM CHLORIDE 0.05 % MT LIQD
7.0000 mL | Freq: Two times a day (BID) | OROMUCOSAL | Status: DC
  Administered 2015-11-15 (×2): 7 mL via OROMUCOSAL

## 2015-11-15 MED ORDER — INSULIN DETEMIR 100 UNIT/ML ~~LOC~~ SOLN
18.0000 [IU] | Freq: Every day | SUBCUTANEOUS | Status: DC
  Administered 2015-11-15 – 2015-11-16 (×2): 18 [IU] via SUBCUTANEOUS
  Filled 2015-11-15 (×2): qty 0.18

## 2015-11-15 MED ORDER — POTASSIUM CHLORIDE 10 MEQ/100ML IV SOLN
10.0000 meq | INTRAVENOUS | Status: DC
  Administered 2015-11-15 (×3): 10 meq via INTRAVENOUS

## 2015-11-15 MED ORDER — POTASSIUM CHLORIDE 10 MEQ/100ML IV SOLN
INTRAVENOUS | Status: AC
  Administered 2015-11-15: 10 meq via INTRAVENOUS
  Filled 2015-11-15: qty 300

## 2015-11-15 MED ORDER — INSULIN ASPART 100 UNIT/ML ~~LOC~~ SOLN
0.0000 [IU] | Freq: Three times a day (TID) | SUBCUTANEOUS | Status: DC
  Administered 2015-11-15: 1 [IU] via SUBCUTANEOUS
  Administered 2015-11-15: 3 [IU] via SUBCUTANEOUS
  Administered 2015-11-16: 2 [IU] via SUBCUTANEOUS
  Administered 2015-11-16: 5 [IU] via SUBCUTANEOUS

## 2015-11-15 MED ORDER — CHLORHEXIDINE GLUCONATE 0.12 % MT SOLN
15.0000 mL | Freq: Two times a day (BID) | OROMUCOSAL | Status: DC
  Administered 2015-11-15 – 2015-11-16 (×3): 15 mL via OROMUCOSAL
  Filled 2015-11-15 (×3): qty 15

## 2015-11-15 NOTE — Progress Notes (Signed)
PROGRESS NOTE        PATIENT DETAILS Name: [name] Age: 50 y.o. Sex: female Date of Birth: Jun 16, 1966 Admit Date: 11/13/2015 Admitting Physician Ivor Costa, MD [id number], Jarold Song, MD  Brief Narrative: Patient is a 50 y.o. female with PMHx of DM-1, Gastroparesis presented with DKA and presumed gastroparesis flare.   Subjective: No vomiting, nausea or abd pain this am.Tolerated full liquids.  Assessment/Plan: Principal Problem: Diabetic ketoacidosis without coma associated with type 1 diabetes mellitus:resolved with IV Insulin infusion, AG has closed, patient has chronic non AG acidosis at baseline. Stop IV Insulin and transition to Levemir and SSI. Transfer to med surg unit.   Active Problems: Gastroparesis flare:improved-no vomting-start full liquids-continue IV Reglan and other supportive measures. Counseled regarding importance of low portion diet.    [id number] mild pre-renal azotemia from GI loss-will monitor off IVF-mildly elevated creatinine persists-but trend is towards improvement.  [id number] controlled-continue prn hydralazine-once BP improves further will restart Lisinopril  Dyslipidemia: hold statins-resume when oral intake more stable  Microcytic Anemia: most recent Fe panel-consistent with Fe deficiency-resume oral Fe supplementation when intake more stable.Hb currently stable-will expect some drop with acute illness and IVF.  GERD:continue PPI  Depression and Anxiety: seems very pleasant at this time-continue Prozac and Buspar  DVT Prophylaxis: Prophylactic Lovenox  Code Status: Full code   Family Communication: None at bedside  Disposition Plan: Remain inpatient-transfer to med surg-home 5/24 if oral intake advanced.  Antimicrobial agents: None  Procedures: None  CONSULTS:  None  Time spent: 25 minutes-Greater than 50% of this time was spent in counseling, explanation of diagnosis, planning of further  management, and coordination of care.  MEDICATIONS: Anti-infectives    None      Scheduled Meds: . antiseptic oral rinse  7 mL Mouth Rinse q12n4p  . busPIRone  5 mg Oral TID  . chlorhexidine  15 mL Mouth Rinse BID  . enoxaparin (LOVENOX) injection  40 mg Subcutaneous Q24H  . FLUoxetine  40 mg Oral Daily  . insulin aspart  0-9 Units Subcutaneous TID WC  . insulin detemir  18 Units Subcutaneous Daily  . loratadine  10 mg Oral Daily  . metoCLOPramide (REGLAN) injection  10 mg Intravenous Q6H  . pantoprazole (PROTONIX) IV  40 mg Intravenous Q24H  . pregabalin  150 mg Oral BID  . sodium chloride  1,000 mL Intravenous Once   Continuous Infusions: . sodium chloride    . sodium chloride Stopped (11/14/15 1151)  . sodium chloride 10 mL/hr at 11/14/15 2333  . dextrose 5 % and 0.45% NaCl 125 mL/hr at 11/15/15 1057  . insulin (NOVOLIN-R) infusion 3 Units/hr (11/15/15 1147)   PRN Meds:.albuterol, hydrALAZINE, morphine injection, ondansetron   PHYSICAL EXAM: Vital signs: Filed Vitals:   11/15/15 0600 11/15/15 0700 11/15/15 0800 11/15/15 1153  BP: 114/55  126/61   Pulse: 86 84 80   Temp:   98 F (36.7 C) 98 F (36.7 C)  TempSrc:   Oral Oral  Resp: 13 14 15    Height:      Weight:      SpO2: 98% 100% 100%    Filed Weights   11/14/15 0600 11/15/15 0400  Weight: 62 kg (136 lb 11 oz) 64.6 kg (142 lb 6.7 oz)   Body mass index is 26.04 kg/(m^2).   Gen Exam: Awake and alert with clear speech. Not in  any distress  Neck: Supple, No JVD.   Chest: B/L Clear.   CVS: S1 S2 Regular, no murmurs.  Abdomen: soft, BS +, non tender, non distended.  Extremities: no edema, lower extremities warm to touch. Neurologic: Non Focal.  Skin: No Rash or lesions   Wounds: N/A.   LABORATORY DATA: CBC:  Recent Labs Lab 11/14/15 0050  WBC 10.0  NEUTROABS 8.8*  HGB 10.2*  HCT 32.6*  MCV 72.1*  PLT [id number]    Basic Metabolic Panel:  Recent Labs Lab 11/14/15 0050 11/14/15 0640  11/14/15 1035 11/14/15 1417 11/15/15 0522  NA 131* 137 139 140 138  K 4.6 3.9 4.0 4.0 3.3*  CL 99* 110 114* 114* 112*  CO2 15* 11* 16* 17* 19*  GLUCOSE 500* 416* 166* 169* 151*  BUN 27* 23* 23* 25* 17  CREATININE 1.20* 1.29* 1.35* 1.15* 1.18*  CALCIUM 9.2 8.7* 8.3* 8.1* 7.7*  MG  --   --   --  2.0 2.0    GFR: Estimated Creatinine Clearance: 51.5 mL/min (by C-G formula based on Cr of 1.18).  Liver Function Tests:  Recent Labs Lab 11/14/15 0050  AST 20  ALT 16  ALKPHOS 103  BILITOT 1.6*  PROT 8.2*  ALBUMIN 4.0    Recent Labs Lab 11/14/15 0050  LIPASE 21   No results for input(s): AMMONIA in the last 168 hours.  Coagulation Profile: No results for input(s): INR, PROTIME in the last 168 hours.  Cardiac Enzymes: No results for input(s): CKTOTAL, CKMB, CKMBINDEX, TROPONINI in the last 168 hours.  BNP (last 3 results) No results for input(s): PROBNP in the last 8760 hours.  HbA1C: No results for input(s): HGBA1C in the last 72 hours.  CBG:  Recent Labs Lab 11/15/15 0616 11/15/15 0732 11/15/15 0843 11/15/15 0924 11/15/15 1035  GLUCAP 155* 155* 157* 161* 213*    Lipid Profile: No results for input(s): CHOL, HDL, LDLCALC, TRIG, CHOLHDL, LDLDIRECT in the last 72 hours.  Thyroid Function Tests: No results for input(s): TSH, T4TOTAL, FREET4, T3FREE, THYROIDAB in the last 72 hours.  Anemia Panel: No results for input(s): VITAMINB12, FOLATE, FERRITIN, TIBC, IRON, RETICCTPCT in the last 72 hours.  Urine analysis:    Component Value Date/Time   COLORURINE YELLOW 11/14/2015 0232   APPEARANCEUR CLEAR 11/14/2015 0232   LABSPEC 1.022 11/14/2015 0232   PHURINE 6.0 11/14/2015 0232   GLUCOSEU >1000* 11/14/2015 0232   HGBUR LARGE* 11/14/2015 0232   BILIRUBINUR NEGATIVE 11/14/2015 0232   BILIRUBINUR NEGATIVE 08/04/2012 1005   KETONESUR >80* 11/14/2015 0232   PROTEINUR NEGATIVE 11/14/2015 0232   PROTEINUR >=300 08/04/2012 1005   UROBILINOGEN 0.2 04/27/2015  1608   UROBILINOGEN 0.2 08/04/2012 1005   NITRITE NEGATIVE 11/14/2015 0232   NITRITE POSITIVE 08/04/2012 1005   LEUKOCYTESUR NEGATIVE 11/14/2015 0232    Sepsis Labs: Lactic Acid, Venous    Component Value Date/Time   LATICACIDVEN 3.8* 07/29/2015 0930    MICROBIOLOGY: Recent Results (from the past 240 hour(s))  MRSA PCR Screening     Status: None   Collection Time: 11/14/15  5:31 AM  Result Value Ref Range Status   MRSA by PCR NEGATIVE NEGATIVE Final    Comment:        The GeneXpert MRSA Assay (FDA approved for NASAL specimens only), is one component of a comprehensive MRSA colonization surveillance program. It is not intended to diagnose MRSA infection nor to guide or monitor treatment for MRSA infections.     RADIOLOGY STUDIES/RESULTS: Dg Abd Portable  2v  11/15/2015  CLINICAL DATA:  Vomiting.  Abdominal pain. EXAM: PORTABLE ABDOMEN - 2 VIEW COMPARISON:  07/29/2015.  04/16/2015. FINDINGS: Right femoral line noted with its tip projected at the level of L4-L5. Surgical clips right upper quadrant. No significant bowel distention. Several nonspecific nondilated air-filled loops of small bowel noted. Moderate stool volume. No free air. Lumbar spine scoliosis concave left. No acute bony abnormality. IMPRESSION: 1.  Right femoral line noted with its tip at the L4-L5 level. 2.  No acute abnormality identified. Electronically Signed   By: Marcello Moores  Register   On: 11/15/2015 07:28       Oren Binet, MD  [facility] Pager:[phone]  If 7PM-7AM, please contact night-coverage [email or web address] Password [facility] 11/15/2015, 12:33 PM

## 2015-11-15 NOTE — Hospital Discharge Follow-Up (Signed)
The patient is known to the [facility] and has been followed by the [facility] in the past.  Attempted to meet with her this afternoon to discuss scheduling a follow up appointment;  but she was transferring rooms.    Will follow her hospital course.

## 2015-11-16 DIAGNOSIS — E1010 Type 1 diabetes mellitus with ketoacidosis without coma: Secondary | ICD-10-CM | POA: Diagnosis not present

## 2015-11-16 DIAGNOSIS — N179 Acute kidney failure, unspecified: Secondary | ICD-10-CM | POA: Diagnosis not present

## 2015-11-16 DIAGNOSIS — E1143 Type 2 diabetes mellitus with diabetic autonomic (poly)neuropathy: Secondary | ICD-10-CM | POA: Diagnosis not present

## 2015-11-16 DIAGNOSIS — F418 Other specified anxiety disorders: Secondary | ICD-10-CM | POA: Diagnosis not present

## 2015-11-16 LAB — BASIC METABOLIC PANEL
Anion gap: 4 — ABNORMAL LOW (ref 5–15)
BUN: 10 mg/dL (ref 6–20)
CALCIUM: 8 mg/dL — AB (ref 8.9–10.3)
CO2: 21 mmol/L — ABNORMAL LOW (ref 22–32)
CREATININE: 1.09 mg/dL — AB (ref 0.44–1.00)
Chloride: 112 mmol/L — ABNORMAL HIGH (ref 101–111)
GFR calc Af Amer: >60 mL/min (ref >60)
GFR, EST NON AFRICAN AMERICAN: 59 mL/min — AB (ref >60)
Glucose, Bld: 249 mg/dL — ABNORMAL HIGH (ref 65–99)
Potassium: 4 mmol/L (ref 3.5–5.1)
SODIUM: 137 mmol/L (ref 135–145)

## 2015-11-16 LAB — GLUCOSE, CAPILLARY
GLUCOSE-CAPILLARY: 294 mg/dL — AB (ref 65–99)
Glucose-Capillary: 191 mg/dL — ABNORMAL HIGH (ref 65–99)

## 2015-11-16 MED ORDER — SODIUM CHLORIDE 0.9% FLUSH: 10.0000 mL | INTRAVENOUS | Status: DC | PRN

## 2015-11-16 MED ORDER — INSULIN DETEMIR 100 UNIT/ML ~~LOC~~ SOLN: 24.0000 [IU] | Freq: Every day | SUBCUTANEOUS | Status: DC

## 2015-11-16 MED ORDER — PANTOPRAZOLE SODIUM 40 MG PO TBEC: 40.0000 mg | DELAYED_RELEASE_TABLET | Freq: Every day | ORAL | Status: DC

## 2015-11-16 MED ORDER — METOCLOPRAMIDE HCL 10 MG PO TABS: 10.0000 mg | ORAL_TABLET | Freq: Four times a day (QID) | ORAL | Status: DC | PRN

## 2015-11-16 MED ORDER — ONDANSETRON 4 MG PO TBDP: 4.0000 mg | ORAL_TABLET | Freq: Three times a day (TID) | ORAL | Status: DC | PRN

## 2015-11-16 MED ORDER — SODIUM CHLORIDE 0.9% FLUSH: 10.0000 mL | Freq: Two times a day (BID) | INTRAVENOUS | Status: DC

## 2015-11-16 MED ORDER — METOCLOPRAMIDE HCL 5 MG/5ML PO SOLN
10.0000 mg | Freq: Three times a day (TID) | ORAL | Status: DC
  Filled 2015-11-16 (×4): qty 10

## 2015-11-16 MED ORDER — METOCLOPRAMIDE HCL 5 MG/5ML PO SOLN: 10.0000 mg | Freq: Four times a day (QID) | ORAL | Status: DC | PRN

## 2015-11-16 NOTE — Discharge Summary (Signed)
PATIENT DETAILS Name: [name] Age: 50 y.o. Sex: female Date of Birth: 1966-01-31 MRN: [id number]. Admitting Physician: Ivor Costa, MD [id number], Jarold Song, MD  Admit Date: 11/13/2015 Discharge date: 11/16/2015  Recommendations for Outpatient Follow-up:  1. Ensure compliance with the Reglan and low portion meals. 2. Please repeat CBC/BMET at next visit   PRIMARY DISCHARGE DIAGNOSIS:  Principal Problem:   Diabetic ketoacidosis without coma associated with type 1 diabetes mellitus (HCC) Active Problems:   Nausea & vomiting   Diabetic neuropathy, type I diabetes mellitus (HCC)   Anxiety and depression   Diabetic gastroparesis (HCC)   DKA (diabetic ketoacidoses) (HCC)   AKI (acute kidney injury) ([place])   HLD (hyperlipidemia)   GERD (gastroesophageal reflux disease)   Gastroparesis      PAST MEDICAL HISTORY: Past Medical History  Diagnosis Date  . Diabetes mellitus without complication ([place])   . Essential hypertension   . HLD (hyperlipidemia)   . GERD (gastroesophageal reflux disease)   . Depression   . Gastroparesis     DISCHARGE MEDICATIONS: Current Discharge Medication List    START taking these medications   Details  metoCLOPramide (REGLAN) 10 MG tablet Take 1 tablet (10 mg total) by mouth every 6 (six) hours as needed for nausea. Qty: 45 tablet, Refills: 0      CONTINUE these medications which have CHANGED   Details  ondansetron (ZOFRAN ODT) 4 MG disintegrating tablet Take 1 tablet (4 mg total) by mouth every 8 (eight) hours as needed for nausea. Qty: 40 tablet, Refills: 0   Associated Diagnoses: Diabetic gastroparesis ([place])      CONTINUE these medications which have NOT CHANGED   Details  albuterol (PROVENTIL HFA;VENTOLIN HFA) 108 (90 Base) MCG/ACT inhaler Inhale 2 puffs into the lungs every 6 (six) hours as needed for wheezing or shortness of breath. Qty: 1 Inhaler, Refills: 0   Associated Diagnoses: Seasonal allergies    atorvastatin (LIPITOR) 20  MG tablet Take 1 tablet (20 mg total) by mouth daily at 6 PM. Qty: 30 tablet, Refills: 0    Blood Glucose Monitoring Suppl (TRUE METRIX METER) DEVI 1 each by Does not apply route 3 (three) times daily before meals. Qty: 1 Device, Refills: 0   Associated Diagnoses: Type I diabetes mellitus with complication, uncontrolled (HCC)    busPIRone (BUSPAR) 5 MG tablet Take 1 tablet (5 mg total) by mouth 3 (three) times daily. Qty: 90 tablet, Refills: 3   Associated Diagnoses: Anxiety and depression    cetirizine (ZYRTEC) 10 MG tablet Take 1 tablet (10 mg total) by mouth daily. Qty: 30 tablet, Refills: 11   Associated Diagnoses: Seasonal allergies    docusate sodium (COLACE) 100 MG capsule Take 1 capsule (100 mg total) by mouth 2 (two) times daily. Qty: 60 capsule, Refills: 0    feeding supplement, GLUCERNA SHAKE, (GLUCERNA SHAKE) LIQD Take 237 mLs by mouth 3 (three) times daily between meals.    Ferrous Fumarate (HEMOCYTE - 106 MG FE) 324 (106 Fe) MG TABS tablet Take 1 tablet (106 mg of iron total) by mouth 2 (two) times daily. Qty: 60 tablet, Refills: 0    FLUoxetine (PROZAC) 40 MG capsule Take 40 mg by mouth daily.    glucose blood (TRUE METRIX BLOOD GLUCOSE TEST) test strip Use 3 times daily before meals Qty: 100 each, Refills: 5   Associated Diagnoses: Type I diabetes mellitus with complication, uncontrolled (HCC)    insulin aspart (NOVOLOG) 100 UNIT/ML injection Inject 0-15 Units into the skin 3 (three)  times daily with meals. Per sliding scale Qty: 50 mL, Refills: 3   Associated Diagnoses: Diabetic ketoacidosis without coma associated with type 1 diabetes mellitus (HCC)    insulin detemir (LEVEMIR) 100 UNIT/ML injection Inject 0.25 mLs (25 Units total) into the skin daily. Qty: 30 mL, Refills: 3   Associated Diagnoses: Diabetic ketoacidosis without coma associated with type 1 diabetes mellitus (HCC)    Insulin Syringe-Needle U-100 (BD INSULIN SYRINGE ULTRAFINE) 31G X 15/64" 0.5 ML  MISC 1 each by Does not apply route 4 (four) times daily as needed. Qty: 120 each, Refills: 5   Associated Diagnoses: Type 1 diabetes mellitus with hyperglycemia (HCC)    lisinopril (PRINIVIL,ZESTRIL) 5 MG tablet Take 1 tablet (5 mg total) by mouth daily. Qty: 30 tablet, Refills: 2   Associated Diagnoses: Diabetic neuropathy, type I diabetes mellitus ([place])    pantoprazole (PROTONIX) 40 MG tablet Take 40 mg by mouth daily. Reported on 07/19/2015    polyethylene glycol powder (GLYCOLAX/MIRALAX) powder Take 17 g by mouth daily as needed for moderate constipation. Reported on 07/19/2015    pregabalin (LYRICA) 150 MG capsule Take 1 capsule (150 mg total) by mouth 2 (two) times daily. Qty: 180 capsule, Refills: 3   Associated Diagnoses: Diabetic neuropathy, type I diabetes mellitus (HCC)    promethazine (PHENERGAN) 25 MG suppository Place 1 suppository (25 mg total) rectally every 6 (six) hours as needed for nausea or vomiting. Qty: 12 each, Refills: 0   Associated Diagnoses: Diabetic gastroparesis ([place])    TRUEPLUS LANCETS 28G MISC 1 each by Does not apply route 3 (three) times daily before meals. Qty: 100 each, Refills: 5   Associated Diagnoses: Type I diabetes mellitus with complication, uncontrolled ([place])        ALLERGIES:   Allergies  Allergen Reactions  . Anesthetics, Amide Nausea And Vomiting  . Penicillins Diarrhea and Nausea And Vomiting    Has patient had a PCN reaction causing immediate rash, facial/tongue/throat swelling, SOB or lightheadedness with hypotension: No Has patient had a PCN reaction causing severe rash involving mucus membranes or skin necrosis: No Has patient had a PCN reaction that required hospitalization No Has patient had a PCN reaction occurring within the last 10 years: Yes  If all of the above answers are "NO", then may proceed with Cephalosporin use.   . Buprenorphine Hcl Rash  . Encainide Nausea And Vomiting    BRIEF HPI:  See H&P, Labs, Consult  and Test reports for all details in brief, patient is a 50 y.o. female with PMHx of DM-1, Gastroparesis presented with DKA and presumed gastroparesis flare.   CONSULTATIONS:   None  PERTINENT RADIOLOGIC STUDIES: Dg Abd Portable 2v  11/15/2015  CLINICAL DATA:  Vomiting.  Abdominal pain. EXAM: PORTABLE ABDOMEN - 2 VIEW COMPARISON:  07/29/2015.  04/16/2015. FINDINGS: Right femoral line noted with its tip projected at the level of L4-L5. Surgical clips right upper quadrant. No significant bowel distention. Several nonspecific nondilated air-filled loops of small bowel noted. Moderate stool volume. No free air. Lumbar spine scoliosis concave left. No acute bony abnormality. IMPRESSION: 1.  Right femoral line noted with its tip at the L4-L5 level. 2.  No acute abnormality identified. Electronically Signed   By: Marcello Moores  Register   On: 11/15/2015 07:28     PERTINENT LAB RESULTS: CBC:  Recent Labs  11/14/15 0050  WBC 10.0  HGB 10.2*  HCT 32.6*  PLT 361   CMET CMP     Component Value Date/Time  NA 137 11/16/2015 0600   K 4.0 11/16/2015 0600   CL 112* 11/16/2015 0600   CO2 21* 11/16/2015 0600   GLUCOSE 249* 11/16/2015 0600   BUN 10 11/16/2015 0600   CREATININE 1.09* 11/16/2015 0600   CALCIUM 8.0* 11/16/2015 0600   PROT 8.2* 11/14/2015 0050   ALBUMIN 4.0 11/14/2015 0050   AST 20 11/14/2015 0050   ALT 16 11/14/2015 0050   ALKPHOS 103 11/14/2015 0050   BILITOT 1.6* 11/14/2015 0050   GFRNONAA 59* 11/16/2015 0600   GFRAA >60 11/16/2015 0600    GFR Estimated Creatinine Clearance: 55.7 mL/min (by C-G formula based on Cr of 1.09).  Recent Labs  11/14/15 0050  LIPASE 21   No results for input(s): CKTOTAL, CKMB, CKMBINDEX, TROPONINI in the last 72 hours. Invalid input(s): POCBNP No results for input(s): DDIMER in the last 72 hours. No results for input(s): HGBA1C in the last 72 hours. No results for input(s): CHOL, HDL, LDLCALC, TRIG, CHOLHDL, LDLDIRECT in the last 72 hours. No  results for input(s): TSH, T4TOTAL, T3FREE, THYROIDAB in the last 72 hours.  Invalid input(s): FREET3 No results for input(s): VITAMINB12, FOLATE, FERRITIN, TIBC, IRON, RETICCTPCT in the last 72 hours. Coags: No results for input(s): INR in the last 72 hours.  Invalid input(s): PT Microbiology: Recent Results (from the past 240 hour(s))  MRSA PCR Screening     Status: None   Collection Time: 11/14/15  5:31 AM  Result Value Ref Range Status   MRSA by PCR NEGATIVE NEGATIVE Final    Comment:        The GeneXpert MRSA Assay (FDA approved for NASAL specimens only), is one component of a comprehensive MRSA colonization surveillance program. It is not intended to diagnose MRSA infection nor to guide or monitor treatment for MRSA infections.      BRIEF HOSPITAL COURSE:  Diabetic ketoacidosis without coma associated with type 1 diabetes mellitus:resolved with IV Insulin infusion, AG has closed, patient has chronic non AG acidosis at baseline. She has now been transitioned  to Levemir and SSI. CBGs remained stable, no further vomiting, advance to regular dosing of Levemir and SSI on discharge. Follow-up with PCP for further optimization of diabetic regimen.  Active Problems: Gastroparesis flare: Resolved with supportive care, including scheduled IV Reglan and other antiemetics. Diet gradually advanced, by the day of discharge tolerating a regular diet. She will be placed on as needed Reglan, and she has been counseled regarding importance of low portion diet. Follow-up with PCP for further needs. She apparently used to follow-up with gastroenterology at [facility], she has been asked to see if she can get a follow-up appointment.  [id number] mild pre-renal azotemia from GI loss-essentially resolved with supportive measures. Follow electrolytes at next visit with PCP  [id number] controlled-resume lisinopril on discharge.   Dyslipidemia: hold statins-resume when oral intake more  stable  Microcytic Anemia: most recent Fe panel-consistent with Fe deficiency-resume oral Fe supplementation   GERD:continue PPI  Depression and Anxiety: seems very pleasant at this time-continue Prozac and Buspar  TODAY-DAY OF DISCHARGE:  Subjective:   [name] today has no headache,no chest abdominal pain,no new weakness tingling or numbness, feels much better wants to go home today.   Objective:   Blood pressure 120/62, pulse 87, temperature 98.1 F (36.7 C), temperature source Oral, resp. rate 15, height 5\' 2"  (1.575 m), weight 64.6 kg (142 lb 6.7 oz), last menstrual period 10/14/2015, SpO2 98 %.  Intake/Output Summary (Last 24 hours) at 11/16/15 1049 Last  data filed at 11/15/15 2100  Gross per 24 hour  Intake 142.17 ml  Output      0 ml  Net 142.17 ml   Filed Weights   11/14/15 0600 11/15/15 0400  Weight: 62 kg (136 lb 11 oz) 64.6 kg (142 lb 6.7 oz)    Exam Awake Alert, Oriented *3, No new F.N deficits, Normal affect [place].AT,PERRAL Supple Neck,No JVD, No cervical lymphadenopathy appriciated.  Symmetrical Chest wall movement, Good air movement bilaterally, CTAB RRR,No Gallops,Rubs or new Murmurs, No Parasternal Heave +ve B.Sounds, Abd Soft, Non tender, No organomegaly appriciated, No rebound -guarding or rigidity. No Cyanosis, Clubbing or edema, No new Rash or bruise  DISCHARGE CONDITION: Stable  DISPOSITION: Home  DISCHARGE INSTRUCTIONS:    Activity:  As tolerated   Get Medicines reviewed and adjusted: Please take all your medications with you for your next visit with your Primary MD  Please request your Primary MD to go over all hospital tests and procedure/radiological results at the follow up, please ask your Primary MD to get all Hospital records sent to his/her office.  If you experience worsening of your admission symptoms, develop shortness of breath, life threatening emergency, suicidal or homicidal thoughts you must seek medical attention  immediately by calling 911 or calling your MD immediately  if symptoms less severe.  You must read complete instructions/literature along with all the possible adverse reactions/side effects for all the Medicines you take and that have been prescribed to you. Take any new Medicines after you have completely understood and accpet all the possible adverse reactions/side effects.   Do not drive when taking Pain medications.   Do not take more than prescribed Pain, Sleep and Anxiety Medications  Special Instructions: If you have smoked or chewed Tobacco  in the last 2 yrs please stop smoking, stop any regular Alcohol  and or any Recreational drug use.  Wear Seat belts while driving.  Please note  You were cared for by a hospitalist during your hospital stay. Once you are discharged, your primary care physician will handle any further medical issues. Please note that NO REFILLS for any discharge medications will be authorized once you are discharged, as it is imperative that you return to your primary care physician (or establish a relationship with a primary care physician if you do not have one) for your aftercare needs so that they can reassess your need for medications and monitor your lab values.   Diet recommendation: Diabetic Diet Heart Healthy diet  Discharge Instructions    Call MD for:  persistant nausea and vomiting    Complete by:  As directed      Diet - low sodium heart healthy    Complete by:  As directed      Diet Carb Modified    Complete by:  As directed      Increase activity slowly    Complete by:  As directed            Follow-up Information    Schedule an appointment as soon as possible for a visit with [place].   Why:  hospital f/u appt   Contact information:   [address] [place] [phone]     Total Time spent on discharge equals  45  minutes.  SignedOren Binet 11/16/2015 10:49 AM

## 2015-11-16 NOTE — Hospital Discharge Follow-Up (Signed)
Scientist, research (physical sciences) and [place]:  Patient known to [place]. PCP: Jarold Song. Hospital follow-up appointment scheduled for 11/24/15 at 1200 with Dr. Jarold Song. AVS updated. Dessa Phi, RN CM also updated.

## 2015-11-16 NOTE — Progress Notes (Signed)
PT Cancellation Note  Patient Details Name: Skilynn Moist MRN: [id number] DOB: 07/18/1965   Cancelled Treatment:    Reason Eval/Treat Not Completed: PT screened, no needs identified, will sign off RN and pt report no PT needs at this time.   Pinchos Topel,KATHrine E 11/16/2015, 11:07 AM Carmelia Bake, PT, DPT 11/16/2015 Pager: [phone]

## 2015-11-16 NOTE — Care Management Note (Signed)
Case Management Note  Patient Details  Name: Bezawit Brummer MRN: [id number] Date of Birth: 1966-06-11  Subjective/Objective:  50 y/o f admitted w/DKA. From home. Patient to f/u w/[facility] for pcp hospital appt.Patient voiced understanding.                  Action/Plan:d/c plan home.   Expected Discharge Date:   (unknown)               Expected Discharge Plan:  Home/Self Care  In-House Referral:     Discharge planning Services  CM Consult, [facility]  Post Acute Care Choice:    Choice offered to:     DME Arranged:    DME Agency:     HH Arranged:    [place] Agency:     Status of Service:  Completed, signed off  Medicare Important Message Given:    Date Medicare IM Given:    Medicare IM give by:    Date Additional Medicare IM Given:    Additional Medicare Important Message give by:     If discussed at [place] of Stay Meetings, dates discussed:    Additional Comments:  Dessa Phi, RN 11/16/2015, 10:34 AM

## 2015-11-16 NOTE — Progress Notes (Signed)
Inpatient Diabetes Program Recommendations  AACE/ADA: New Consensus Statement on Inpatient Glycemic Control (2015)  Target Ranges:  Prepandial:   less than 140 mg/dL      Peak postprandial:   less than 180 mg/dL (1-2 hours)      Critically ill patients:  140 - 180 mg/dL   Review of Glycemic Control Inpatient Diabetes Program Recommendations:     Fasting glucose this am elevated at 249 mg/dL.  Please consider increase in basal to her home dose of 20 units levemir daily.  Thank you Rosita Kea, RN, MSN, CDE  Diabetes Inpatient Program Office: [phone] Pager: [phone] 8:00 am to 5:00 pm

## 2015-11-24 ENCOUNTER — Inpatient Hospital Stay: Payer: 59 | Admitting: Family Medicine

## 2015-11-27 ENCOUNTER — Observation Stay (HOSPITAL_COMMUNITY)
Admission: EM | Admit: 2015-11-27 | Discharge: 2015-12-01 | Disposition: A | Discharge status: Home / Self Care | Payer: 59 | Attending: Internal Medicine | Admitting: Internal Medicine

## 2015-11-27 ENCOUNTER — Encounter (HOSPITAL_COMMUNITY): Payer: Self-pay

## 2015-11-27 DIAGNOSIS — K3184 Gastroparesis: Secondary | ICD-10-CM | POA: Insufficient documentation

## 2015-11-27 DIAGNOSIS — F419 Anxiety disorder, unspecified: Secondary | ICD-10-CM

## 2015-11-27 DIAGNOSIS — Z794 Long term (current) use of insulin: Secondary | ICD-10-CM | POA: Insufficient documentation

## 2015-11-27 DIAGNOSIS — E1010 Type 1 diabetes mellitus with ketoacidosis without coma: Secondary | ICD-10-CM | POA: Diagnosis not present

## 2015-11-27 DIAGNOSIS — D509 Iron deficiency anemia, unspecified: Secondary | ICD-10-CM | POA: Diagnosis not present

## 2015-11-27 DIAGNOSIS — F410 Panic disorder [episodic paroxysmal anxiety] without agoraphobia: Secondary | ICD-10-CM | POA: Insufficient documentation

## 2015-11-27 DIAGNOSIS — E1143 Type 2 diabetes mellitus with diabetic autonomic (poly)neuropathy: Secondary | ICD-10-CM | POA: Diagnosis present

## 2015-11-27 DIAGNOSIS — E1040 Type 1 diabetes mellitus with diabetic neuropathy, unspecified: Secondary | ICD-10-CM | POA: Diagnosis present

## 2015-11-27 DIAGNOSIS — I10 Essential (primary) hypertension: Secondary | ICD-10-CM | POA: Insufficient documentation

## 2015-11-27 DIAGNOSIS — K219 Gastro-esophageal reflux disease without esophagitis: Secondary | ICD-10-CM | POA: Diagnosis present

## 2015-11-27 DIAGNOSIS — Z79899 Other long term (current) drug therapy: Secondary | ICD-10-CM | POA: Insufficient documentation

## 2015-11-27 DIAGNOSIS — E785 Hyperlipidemia, unspecified: Secondary | ICD-10-CM | POA: Diagnosis present

## 2015-11-27 DIAGNOSIS — R9431 Abnormal electrocardiogram [ECG] [EKG]: Secondary | ICD-10-CM | POA: Diagnosis not present

## 2015-11-27 DIAGNOSIS — E1043 Type 1 diabetes mellitus with diabetic autonomic (poly)neuropathy: Secondary | ICD-10-CM | POA: Insufficient documentation

## 2015-11-27 DIAGNOSIS — E538 Deficiency of other specified B group vitamins: Secondary | ICD-10-CM | POA: Diagnosis present

## 2015-11-27 DIAGNOSIS — R112 Nausea with vomiting, unspecified: Secondary | ICD-10-CM

## 2015-11-27 DIAGNOSIS — F32A Depression, unspecified: Secondary | ICD-10-CM

## 2015-11-27 DIAGNOSIS — E876 Hypokalemia: Secondary | ICD-10-CM | POA: Insufficient documentation

## 2015-11-27 DIAGNOSIS — F329 Major depressive disorder, single episode, unspecified: Secondary | ICD-10-CM | POA: Insufficient documentation

## 2015-11-27 LAB — URINALYSIS, ROUTINE W REFLEX MICROSCOPIC
Bilirubin Urine: NEGATIVE
Ketones, ur: 40 mg/dL — AB
LEUKOCYTES UA: NEGATIVE
Nitrite: NEGATIVE
Protein, ur: 100 mg/dL — AB
SPECIFIC GRAVITY, URINE: 1.026 (ref 1.005–1.030)
pH: 7 (ref 5.0–8.0)

## 2015-11-27 LAB — BLOOD GAS, VENOUS
Acid-base deficit: 2.1 mmol/L — ABNORMAL HIGH (ref 0.0–2.0)
BICARBONATE: 21.6 meq/L (ref 20.0–24.0)
O2 Saturation: 54 %
PCO2 VEN: 34.8 mmHg — AB (ref 45.0–50.0)
Patient temperature: 98.6
TCO2: 20.3 mmol/L (ref 0–100)
pH, Ven: 7.41 — ABNORMAL HIGH (ref 7.250–7.300)
pO2, Ven: 31.6 mmHg (ref 31.0–45.0)

## 2015-11-27 LAB — CBG MONITORING, ED
GLUCOSE-CAPILLARY: 402 mg/dL — AB (ref 65–99)
GLUCOSE-CAPILLARY: 424 mg/dL — AB (ref 65–99)
GLUCOSE-CAPILLARY: 431 mg/dL — AB (ref 65–99)
GLUCOSE-CAPILLARY: 395 mg/dL — AB (ref 65–99)

## 2015-11-27 LAB — RAPID URINE DRUG SCREEN, HOSP PERFORMED
AMPHETAMINES: NOT DETECTED
BARBITURATES: NOT DETECTED
BENZODIAZEPINES: NOT DETECTED
COCAINE: NOT DETECTED
Opiates: NOT DETECTED
TETRAHYDROCANNABINOL: NOT DETECTED

## 2015-11-27 LAB — GLUCOSE, CAPILLARY
GLUCOSE-CAPILLARY: 389 mg/dL — AB (ref 65–99)
GLUCOSE-CAPILLARY: 265 mg/dL — AB (ref 65–99)

## 2015-11-27 LAB — COMPREHENSIVE METABOLIC PANEL
ALT: 19 U/L (ref 14–54)
ANION GAP: 16 — AB (ref 5–15)
AST: 23 U/L (ref 15–41)
Albumin: 4.3 g/dL (ref 3.5–5.0)
Alkaline Phosphatase: 105 U/L (ref 38–126)
BUN: 14 mg/dL (ref 6–20)
CHLORIDE: 97 mmol/L — AB (ref 101–111)
CO2: 21 mmol/L — AB (ref 22–32)
Calcium: 9.6 mg/dL (ref 8.9–10.3)
Creatinine, Ser: 1.09 mg/dL — ABNORMAL HIGH (ref 0.44–1.00)
GFR calc non Af Amer: 59 mL/min — ABNORMAL LOW (ref >60)
Glucose, Bld: 419 mg/dL — ABNORMAL HIGH (ref 65–99)
POTASSIUM: 3.9 mmol/L (ref 3.5–5.1)
SODIUM: 134 mmol/L — AB (ref 135–145)
Total Bilirubin: 1.3 mg/dL — ABNORMAL HIGH (ref 0.3–1.2)
Total Protein: 8.4 g/dL — ABNORMAL HIGH (ref 6.5–8.1)

## 2015-11-27 LAB — PHOSPHORUS: PHOSPHORUS: 2.6 mg/dL (ref 2.5–4.6)

## 2015-11-27 LAB — BASIC METABOLIC PANEL
ANION GAP: 18 — AB (ref 5–15)
ANION GAP: 15 (ref 5–15)
BUN: 14 mg/dL (ref 6–20)
BUN: 14 mg/dL (ref 6–20)
CHLORIDE: 113 mmol/L — AB (ref 101–111)
CO2: 14 mmol/L — AB (ref 22–32)
CO2: 11 mmol/L — ABNORMAL LOW (ref 22–32)
Calcium: 9.8 mg/dL (ref 8.9–10.3)
Calcium: 9.2 mg/dL (ref 8.9–10.3)
Chloride: 105 mmol/L (ref 101–111)
Creatinine, Ser: 1.11 mg/dL — ABNORMAL HIGH (ref 0.44–1.00)
Creatinine, Ser: 1.09 mg/dL — ABNORMAL HIGH (ref 0.44–1.00)
GFR calc Af Amer: >60 mL/min (ref >60)
GFR calc non Af Amer: 59 mL/min — ABNORMAL LOW (ref >60)
GFR, EST NON AFRICAN AMERICAN: 58 mL/min — AB (ref >60)
GLUCOSE: 385 mg/dL — AB (ref 65–99)
Glucose, Bld: 243 mg/dL — ABNORMAL HIGH (ref 65–99)
POTASSIUM: 4.5 mmol/L (ref 3.5–5.1)
POTASSIUM: 4.1 mmol/L (ref 3.5–5.1)
SODIUM: 139 mmol/L (ref 135–145)
Sodium: 137 mmol/L (ref 135–145)

## 2015-11-27 LAB — CBC
HEMATOCRIT: 30.6 % — AB (ref 36.0–46.0)
HEMOGLOBIN: 9.5 g/dL — AB (ref 12.0–15.0)
MCH: 21.8 pg — AB (ref 26.0–34.0)
MCHC: 31 g/dL (ref 30.0–36.0)
MCV: 70.3 fL — AB (ref 78.0–100.0)
Platelets: 445 10*3/uL — ABNORMAL HIGH (ref 150–400)
RBC: 4.35 MIL/uL (ref 3.87–5.11)
RDW: 17.1 % — ABNORMAL HIGH (ref 11.5–15.5)
WBC: 8.7 10*3/uL (ref 4.0–10.5)

## 2015-11-27 LAB — TROPONIN I: Troponin I: <0.03 ng/mL (ref <0.031)

## 2015-11-27 LAB — BETA-HYDROXYBUTYRIC ACID: BETA-HYDROXYBUTYRIC ACID: 3.73 mmol/L — AB (ref 0.05–0.27)

## 2015-11-27 LAB — MAGNESIUM: MAGNESIUM: 2 mg/dL (ref 1.7–2.4)

## 2015-11-27 LAB — URINE MICROSCOPIC-ADD ON
Bacteria, UA: NONE SEEN
RBC / HPF: NONE SEEN RBC/hpf (ref 0–5)
WBC UA: NONE SEEN WBC/hpf (ref 0–5)

## 2015-11-27 LAB — I-STAT BETA HCG BLOOD, ED (MC, WL, AP ONLY): I-stat hCG, quantitative: <5 m[IU]/mL (ref <5)

## 2015-11-27 LAB — LIPASE, BLOOD: LIPASE: 22 U/L (ref 11–51)

## 2015-11-27 MED ORDER — ONDANSETRON HCL 4 MG/2ML IJ SOLN
4.0000 mg | Freq: Once | INTRAMUSCULAR | Status: AC | PRN
Start: 2015-11-27 — End: 2015-11-27
  Administered 2015-11-27: 4 mg via INTRAVENOUS
  Filled 2015-11-27: qty 2

## 2015-11-27 MED ORDER — DEXTROSE-NACL 5-0.45 % IV SOLN
INTRAVENOUS | Status: DC
  Administered 2015-11-27 – 2015-11-28 (×2): 125 mL via INTRAVENOUS
  Administered 2015-11-28: 08:00:00 via INTRAVENOUS

## 2015-11-27 MED ORDER — PROCHLORPERAZINE EDISYLATE 5 MG/ML IJ SOLN: 10.0000 mg | Freq: Three times a day (TID) | INTRAMUSCULAR | Status: DC

## 2015-11-27 MED ORDER — SODIUM CHLORIDE 0.9 % IV SOLN
INTRAVENOUS | Status: DC
  Administered 2015-11-27: 3.6 [IU]/h via INTRAVENOUS
  Filled 2015-11-27: qty 2.5

## 2015-11-27 MED ORDER — LORATADINE 10 MG PO TABS
10.0000 mg | ORAL_TABLET | Freq: Every day | ORAL | Status: DC
  Administered 2015-11-28 – 2015-12-01 (×4): 10 mg via ORAL
  Filled 2015-11-27 (×4): qty 1

## 2015-11-27 MED ORDER — BUSPIRONE HCL 5 MG PO TABS
5.0000 mg | ORAL_TABLET | Freq: Three times a day (TID) | ORAL | Status: DC
  Administered 2015-11-27 – 2015-11-30 (×7): 5 mg via ORAL
  Filled 2015-11-27 (×10): qty 1

## 2015-11-27 MED ORDER — ENOXAPARIN SODIUM 40 MG/0.4ML ~~LOC~~ SOLN
40.0000 mg | Freq: Every day | SUBCUTANEOUS | Status: DC
  Administered 2015-11-27 – 2015-11-30 (×4): 40 mg via SUBCUTANEOUS
  Filled 2015-11-27 (×4): qty 0.4

## 2015-11-27 MED ORDER — FERROUS FUMARATE 324 (106 FE) MG PO TABS
1.0000 | ORAL_TABLET | Freq: Two times a day (BID) | ORAL | Status: DC
Start: 2015-11-27 — End: 2015-11-28
  Administered 2015-11-27 – 2015-11-28 (×2): 106 mg via ORAL
  Filled 2015-11-27 (×3): qty 1

## 2015-11-27 MED ORDER — PROCHLORPERAZINE EDISYLATE 5 MG/ML IJ SOLN
10.0000 mg | Freq: Once | INTRAMUSCULAR | Status: AC
  Administered 2015-11-27: 10 mg via INTRAVENOUS
  Filled 2015-11-27: qty 2

## 2015-11-27 MED ORDER — ALBUTEROL SULFATE (2.5 MG/3ML) 0.083% IN NEBU: 3.0000 mL | INHALATION_SOLUTION | Freq: Four times a day (QID) | RESPIRATORY_TRACT | Status: DC | PRN

## 2015-11-27 MED ORDER — SODIUM CHLORIDE 0.9 % IV SOLN
INTRAVENOUS | Status: DC
  Administered 2015-11-27: 18:00:00 via INTRAVENOUS

## 2015-11-27 MED ORDER — DEXTROSE-NACL 5-0.45 % IV SOLN: INTRAVENOUS | Status: DC

## 2015-11-27 MED ORDER — PROMETHAZINE HCL 25 MG/ML IJ SOLN
25.0000 mg | Freq: Once | INTRAMUSCULAR | Status: AC
  Administered 2015-11-27: 25 mg via INTRAVENOUS
  Filled 2015-11-27: qty 1

## 2015-11-27 MED ORDER — LORAZEPAM 2 MG/ML IJ SOLN
0.5000 mg | Freq: Four times a day (QID) | INTRAMUSCULAR | Status: DC | PRN
  Administered 2015-11-27 – 2015-11-28 (×2): 0.5 mg via INTRAVENOUS
  Filled 2015-11-27 (×2): qty 1

## 2015-11-27 MED ORDER — SODIUM CHLORIDE 0.9 % IV SOLN
INTRAVENOUS | Status: DC
  Administered 2015-11-27: 3.3 [IU]/h via INTRAVENOUS
  Administered 2015-11-28: 2 [IU]/h via INTRAVENOUS
  Filled 2015-11-27 (×2): qty 2.5

## 2015-11-27 MED ORDER — POTASSIUM CHLORIDE 10 MEQ/100ML IV SOLN
10.0000 meq | INTRAVENOUS | Status: AC
  Administered 2015-11-27 (×2): 10 meq via INTRAVENOUS
  Filled 2015-11-27 (×2): qty 100

## 2015-11-27 MED ORDER — SODIUM CHLORIDE 0.9 % IV BOLUS (SEPSIS)
1000.0000 mL | Freq: Once | INTRAVENOUS | Status: AC
  Administered 2015-11-27: 1000 mL via INTRAVENOUS

## 2015-11-27 MED ORDER — SODIUM CHLORIDE 0.9 % IV SOLN
INTRAVENOUS | Status: DC
  Administered 2015-11-27: 250 mL via INTRAVENOUS

## 2015-11-27 MED ORDER — LORAZEPAM 2 MG/ML IJ SOLN
1.0000 mg | Freq: Once | INTRAMUSCULAR | Status: AC
  Administered 2015-11-27: 15:00:00 via INTRAVENOUS
  Filled 2015-11-27: qty 1

## 2015-11-27 NOTE — H&P (Addendum)
[name] [id number] DOB: 1965-12-22 DOA: 11/27/2015     PCP: [name], MD   Outpatient Specialists: GI Emeterio Reeve Patient coming from:  home Lives  With family    Chief Complaint: Nausea vomiting or abdominal pain  HPI: [name] is a 50 y.o. female with medical history significant of DM 1, gastroparesis, neuropathy, GERD  Chronic non-AG acidosis at baseline number hyperlipidemia, depression  Presented with few hours symptoms of nausea vomiting and abdominal pain similar to prior episodes of gastroparesis this started since 5 AM this morning since then she has vomited at least 10 times. Vomitus is nonbloody nonbilious not associated with any diarrhea fever. She have been having elevated blood sugars for some time. Patient has recurrent admissions for similar presentation for DKA. Reports significant abdominal pain requests something IV. Patietn is somnolent. Answers questions in short words.    Regarding pertinent Chronic problems: Frequent episodes of DKA last admission was on 24th of May Gastroparesis has been in the past treated with successful Reglan she was supposed to follow up with GI at [facility] She has history of chronic iron deficiency anemia baseline hemoglobin between the 9 and 10 IN ER: Tachycardic up to 119, respirations up to 35   Hg 9.5 bicarbonate 21 which is close to patient's baseline creatinine 1.09 which is around baseline glucose 119 Lipase 22 WBC 8.7, AG 16 VBG 7.41/34.8 She was given 2 L normal saline and then started on insulin drip unfortunately blood glucose has trended up From 405 to 430 insulin drip has been adjusted Patient is complaining of recurrent anxiety attacks and was given Ativan IV  Hospitalist was called for admission for DKA and gastroparesis exacerbation  Review of Systems:    Pertinent positives include:  abdominal pain, nausea, vomiting,  Constitutional:  No weight loss, night sweats, Fevers, chills,  fatigue, weight loss  HEENT:  No headaches, Difficulty swallowing,Tooth/dental problems,Sore throat,  No sneezing, itching, ear ache, nasal congestion, post nasal drip,  Cardio-vascular:  No chest pain, Orthopnea, PND, anasarca, dizziness, palpitations.no Bilateral lower extremity swelling  GI:  No heartburn, indigestion, diarrhea, change in bowel habits, loss of appetite, melena, blood in stool, hematemesis Resp:  no shortness of breath at rest. No dyspnea on exertion, No excess mucus, no productive cough, No non-productive cough, No coughing up of blood.No change in color of mucus.No wheezing. Skin:  no rash or lesions. No jaundice GU:  no dysuria, change in color of urine, no urgency or frequency. No straining to urinate.  No flank pain.  Musculoskeletal:  No joint pain or no joint swelling. No decreased range of motion. No back pain.  Psych:  No change in mood or affect. No depression or anxiety. No memory loss.  Neuro: no localizing neurological complaints, no tingling, no weakness, no double vision, no gait abnormality, no slurred speech, no confusion  As per HPI otherwise 10 point review of systems negative.   Past Medical History: Past Medical History  Diagnosis Date  . Diabetes mellitus without complication ([place])   . Essential hypertension   . HLD (hyperlipidemia)   . GERD (gastroesophageal reflux disease)   . Depression   . Gastroparesis    Past Surgical History  Procedure Laterality Date  . Retinal detachment surgery    . Posterior vitrectomy and membrane peel-left eye  09/28/2002  . Posterior vitrectomy and membrane peel-right eye  03/16/2002  . Eye surgery    . Gailstones    . Esophagogastroduodenoscopy  09/27/2014  at [facility], Dr Rolan Lipa. biospy neg for celiac, neg for H pylori.   . Colonoscopy  09/27/2014    at [facility]     Social History:  Ambulatory  independently    reports that she has never smoked. She has never used smokeless tobacco. She  reports that she does not drink alcohol or use illicit drugs.  Allergies:   Allergies  Allergen Reactions  . Anesthetics, Amide Nausea And Vomiting  . Penicillins Diarrhea and Nausea And Vomiting    Has patient had a PCN reaction causing immediate rash, facial/tongue/throat swelling, SOB or lightheadedness with hypotension: No Has patient had a PCN reaction causing severe rash involving mucus membranes or skin necrosis: No Has patient had a PCN reaction that required hospitalization No Has patient had a PCN reaction occurring within the last 10 years: Yes  If all of the above answers are "NO", then may proceed with Cephalosporin use.   . Buprenorphine Hcl Rash  . Encainide Nausea And Vomiting       Family History:    Family History  Problem Relation Age of Onset  . Cystic fibrosis Mother   . Hypertension Father   . Diabetes Brother   . Hypertension Maternal Grandmother     Medications: Prior to Admission medications   Medication Sig Start Date End Date Taking? Authorizing Provider  ondansetron (ZOFRAN ODT) 4 MG disintegrating tablet Take 1 tablet (4 mg total) by mouth every 8 (eight) hours as needed for nausea. 11/16/15  Yes Shanker Kristeen Mans, MD  pregabalin (LYRICA) 150 MG capsule Take 1 capsule (150 mg total) by mouth 2 (two) times daily. 10/25/15  Yes [name], MD  albuterol (PROVENTIL HFA;VENTOLIN HFA) 108 (90 Base) MCG/ACT inhaler Inhale 2 puffs into the lungs every 6 (six) hours as needed for wheezing or shortness of breath. 10/20/15   [name], MD  atorvastatin (LIPITOR) 20 MG tablet Take 1 tablet (20 mg total) by mouth daily at 6 PM. 06/25/15   Allie Bossier, MD  Blood Glucose Monitoring Suppl (TRUE METRIX METER) DEVI 1 each by Does not apply route 3 (three) times daily before meals. 07/19/15   [name], MD  busPIRone (BUSPAR) 5 MG tablet Take 1 tablet (5 mg total) by mouth 3 (three) times daily. 10/20/15   [name], MD  cetirizine (ZYRTEC) 10 MG tablet Take  1 tablet (10 mg total) by mouth daily. 10/20/15   [name], MD  docusate sodium (COLACE) 100 MG capsule Take 1 capsule (100 mg total) by mouth 2 (two) times daily. 09/05/15   Modena Jansky, MD  feeding supplement, GLUCERNA SHAKE, (GLUCERNA SHAKE) LIQD Take 237 mLs by mouth 3 (three) times daily between meals. 09/05/15   Modena Jansky, MD  Ferrous Fumarate (HEMOCYTE - 106 MG FE) 324 (106 Fe) MG TABS tablet Take 1 tablet (106 mg of iron total) by mouth 2 (two) times daily. 09/05/15   Modena Jansky, MD  FLUoxetine (PROZAC) 40 MG capsule Take 40 mg by mouth daily.    Historical Provider, MD  glucose blood (TRUE METRIX BLOOD GLUCOSE TEST) test strip Use 3 times daily before meals 07/19/15   [name], MD  insulin aspart (NOVOLOG) 100 UNIT/ML injection Inject 0-15 Units into the skin 3 (three) times daily with meals. Per sliding scale 10/25/15   [name], MD  insulin detemir (LEVEMIR) 100 UNIT/ML injection Inject 0.25 mLs (25 Units total) into the skin daily. 10/25/15   [name], MD  Insulin Syringe-Needle  U-100 (BD INSULIN SYRINGE ULTRAFINE) 31G X 15/64" 0.5 ML MISC 1 each by Does not apply route 4 (four) times daily as needed. 08/16/15   [name], MD  lisinopril (PRINIVIL,ZESTRIL) 5 MG tablet Take 1 tablet (5 mg total) by mouth daily. 09/20/15   [name], MD  metoCLOPramide (REGLAN) 10 MG tablet Take 1 tablet (10 mg total) by mouth every 6 (six) hours as needed for nausea. 11/16/15   Shanker Kristeen Mans, MD  pantoprazole (PROTONIX) 40 MG tablet Take 40 mg by mouth daily. Reported on 07/19/2015 01/04/15   Historical Provider, MD  polyethylene glycol powder (GLYCOLAX/MIRALAX) powder Take 17 g by mouth daily as needed for moderate constipation. Reported on 07/19/2015 04/06/15   Historical Provider, MD  promethazine (PHENERGAN) 25 MG suppository Place 1 suppository (25 mg total) rectally every 6 (six) hours as needed for nausea or vomiting. 09/20/15   [name], MD  TRUEPLUS LANCETS 28G MISC 1  each by Does not apply route 3 (three) times daily before meals. 07/19/15   [name], MD    Physical Exam: Patient Vitals for the past 24 hrs:  BP Pulse Resp SpO2  11/27/15 1910 (!) 151/137 mmHg 119 (!) 30 96 %  11/27/15 1655 175/97 mmHg 115 (!) 35 100 %  11/27/15 1458 (!) 154/107 mmHg 117 22 100 %  11/27/15 1158 168/87 mmHg 112 22 99 %    1. General:  in No Acute distress 2. Psychological: somnolent but arousable with frequent episodes of hyperventilation, States having panic attack then falls asleep.  Oriented when awke 3. Head/ENT:    Dry Mucous Membranes                          Head Non traumatic, neck supple                          Normal   Dentition 4. SKIN:   decreased Skin turgor,  Skin clean Dry and intact no rash 5. Heart: Regular rate and rhythm no Murmur, Rub or gallop 6. Lungs:  Clear to auscultation bilaterally, no wheezes or crackles   7. Abdomen: Soft, non-tender with palpation with stethoscope but patient reports significant tenderness with mild palpation of the hand, Non distended 8. Lower extremities: no clubbing, cyanosis, or edema 9. Neurologically Grossly intact, moving all 4 extremities equally 10. MSK: Normal range of motion   body mass index is unknown because there is no weight on file.  Labs on Admission:   Labs on Admission: I have personally reviewed following labs and imaging studies  CBC:  Recent Labs Lab 11/27/15 1300  WBC 8.7  HGB 9.5*  HCT 30.6*  MCV 70.3*  PLT [id number]*   Basic Metabolic Panel:  Recent Labs Lab 11/27/15 1300  NA 134*  K 3.9  CL 97*  CO2 21*  GLUCOSE 419*  BUN 14  CREATININE 1.09*  CALCIUM 9.6   GFR: Estimated Creatinine Clearance: 55.7 mL/min (by C-G formula based on Cr of 1.09). Liver Function Tests:  Recent Labs Lab 11/27/15 1300  AST 23  ALT 19  ALKPHOS 105  BILITOT 1.3*  PROT 8.4*  ALBUMIN 4.3    Recent Labs Lab 11/27/15 1300  LIPASE 22   No results for input(s): AMMONIA in the last  168 hours. Coagulation Profile: No results for input(s): INR, PROTIME in the last 168 hours. Cardiac Enzymes: No results for input(s): CKTOTAL, CKMB, CKMBINDEX, TROPONINI in  the last 168 hours. BNP (last 3 results) No results for input(s): PROBNP in the last 8760 hours. HbA1C: No results for input(s): HGBA1C in the last 72 hours. CBG:  Recent Labs Lab 11/27/15 1159 11/27/15 1754 11/27/15 1906  GLUCAP 402* 424* 431*   Lipid Profile: No results for input(s): CHOL, HDL, LDLCALC, TRIG, CHOLHDL, LDLDIRECT in the last 72 hours. Thyroid Function Tests: No results for input(s): TSH, T4TOTAL, FREET4, T3FREE, THYROIDAB in the last 72 hours. Anemia Panel: No results for input(s): VITAMINB12, FOLATE, FERRITIN, TIBC, IRON, RETICCTPCT in the last 72 hours. Urine analysis:    Component Value Date/Time   COLORURINE YELLOW 11/27/2015 1230   APPEARANCEUR CLEAR 11/27/2015 1230   LABSPEC 1.026 11/27/2015 1230   PHURINE 7.0 11/27/2015 1230   GLUCOSEU >1000* 11/27/2015 1230   HGBUR TRACE* 11/27/2015 1230   BILIRUBINUR NEGATIVE 11/27/2015 1230   BILIRUBINUR NEGATIVE 08/04/2012 1005   KETONESUR 40* 11/27/2015 1230   PROTEINUR 100* 11/27/2015 1230   PROTEINUR >=300 08/04/2012 1005   UROBILINOGEN 0.2 04/27/2015 1608   UROBILINOGEN 0.2 08/04/2012 1005   NITRITE NEGATIVE 11/27/2015 1230   NITRITE POSITIVE 08/04/2012 1005   LEUKOCYTESUR NEGATIVE 11/27/2015 1230   Sepsis Labs: @LABRCNTIP (procalcitonin:4,lacticidven:4) )No results found for this or any previous visit (from the past 240 hour(s)).     UA  no evidence of UTI  Lab Results  Component Value Date   HGBA1C 8.7* 07/29/2015    Estimated Creatinine Clearance: 55.7 mL/min (by C-G formula based on Cr of 1.09).  BNP (last 3 results) No results for input(s): PROBNP in the last 8760 hours.   ECG REPORT  Independently reviewed Rate:115   Rhythm: Sinus tachycardia ST&T Change: Slight ST depression in anterior leads QTC level  VIII  There were no vitals filed for this visit.   Cultures:    Component Value Date/Time   SDES BLOOD RIGHT WRIST 07/29/2015 0745   SPECREQUEST IN PEDIATRIC BOTTLE 3CC 07/29/2015 0745   CULT  07/29/2015 0745    NO GROWTH 5 DAYS Performed at [place] [facility] [place] 08/03/2015 FINAL 07/29/2015 0745     Radiological Exams on Admission: No results found.  Chart has been reviewed   Assessment/Plan 50 y.o. female with medical history significant of DM 1, gastroparesis, neuropathy, GERD  Chronic non-AG acidosis at baseline number hyperlipidemia, depression admitted for DKA and gastroparesis  Present on Admission:   . Diabetic ketoacidosis without coma associated with type 1 diabetes mellitus ([place]) will admit per DKA protocol, obtain serial BMET, start on glucosestabalizer, aggressive IVF. Change IVF to D5 1/2Na after BG <250 . Will work up cause of DKA with CXR, ECG one set of cardiac enzymes, UA. Monitor in [place] given intermittent severe anxiety and altered mental status. Replace potassium as needed.     gastroparesis - avoid QT prolonging medications  for now repeat EKG in the morning, restart Reglan once able . Diabetic neuropathy, type I diabetes mellitus ([place]) - Hold Lyrica while prolonged QTC restart plan QT normalizes . Prolonged QT interval - rehydrate and repeat in the morning monitoring on telemetry  . Abnormal ECG - Patient denies chest pain but has significant nausea and vomiting. We'll cycle cardiac enzymes   . Anemia, iron deficiency- Continue iron supplement   Other plan as per orders.  DVT prophylaxis:    Lovenox     Code Status:  FULL CODE as per patient    Family Communication:   Family not at  Bedside    Disposition  Plan:     To home once workup is complete and patient is stable   Consults called: none    Admission status:  inpatient       Level of care  tele         [place] 11/27/2015, 8:04 PM    [facility]  Pager  [phone]   after 2 AM please page floor coverage PA If 7AM-7PM, please contact the day team taking care of the patient  [email or web address]  Password [email or web address]

## 2015-11-27 NOTE — ED Notes (Signed)
Pt with hx of gastroparesis.  Pt seen frequently for n/v and abdominal pain.  Hyperglycemia.  Same symptoms started this morning at 5 am.

## 2015-11-27 NOTE — ED Notes (Signed)
Pt ambulated to bathroom with no issues.

## 2015-11-27 NOTE — ED Notes (Signed)
Pt requesting Ativan, stating that she feels like she is having a panic attack. Pt also reports 10/10 abd pain, requesting pain meds.

## 2015-11-27 NOTE — ED Provider Notes (Signed)
CSN: [id number]     Arrival date & time 11/27/15  1149 History   First MD Initiated Contact with Patient 11/27/15 1224     Chief Complaint  Patient presents with  . Emesis  . Abdominal Pain     (Consider location/radiation/quality/duration/timing/severity/associated sxs/prior Treatment) Patient is a 50 y.o. female presenting with vomiting and abdominal pain. The history is provided by the patient and medical records.  Emesis Associated symptoms: abdominal pain   Abdominal Pain Associated symptoms: nausea and vomiting     50 year old female with poorly controlled type 1 diabetes, hypertension, hyperlipidemia, GERD, depression, gastroparesis, presenting to the ED for nausea, vomiting, abdominal pain. She reports symptoms began this morning at 5 AM. She estimates 10+ episodes of emesis so far this morning. Episodes have been nonbloody, nonbilious. She denies any fever. No diarrhea. Patient states her blood sugar has been running high at home. Of note, patient has had 7 admissions in the past 6 months for DKA with similar presentation.  Patient actively dry heaving during exam.  Past Medical History  Diagnosis Date  . Diabetes mellitus without complication ([place])   . Essential hypertension   . HLD (hyperlipidemia)   . GERD (gastroesophageal reflux disease)   . Depression   . Gastroparesis    Past Surgical History  Procedure Laterality Date  . Retinal detachment surgery    . Posterior vitrectomy and membrane peel-left eye  09/28/2002  . Posterior vitrectomy and membrane peel-right eye  03/16/2002  . Eye surgery    . Gailstones    . Esophagogastroduodenoscopy  09/27/2014    at [facility], Dr Rolan Lipa. biospy neg for celiac, neg for H pylori.   . Colonoscopy  09/27/2014    at [facility]   Family History  Problem Relation Age of Onset  . Cystic fibrosis Mother   . Hypertension Father   . Diabetes Brother   . Hypertension Maternal Grandmother    Social History  Substance Use Topics  .  Smoking status: Never Smoker   . Smokeless tobacco: Never Used  . Alcohol Use: No   OB History    No data available     Review of Systems  Gastrointestinal: Positive for nausea, vomiting and abdominal pain.  All other systems reviewed and are negative.     Allergies  Anesthetics, amide; Penicillins; Buprenorphine hcl; and Encainide  Home Medications   Prior to Admission medications   Medication Sig Start Date End Date Taking? Authorizing Provider  albuterol (PROVENTIL HFA;VENTOLIN HFA) 108 (90 Base) MCG/ACT inhaler Inhale 2 puffs into the lungs every 6 (six) hours as needed for wheezing or shortness of breath. 10/20/15   [name], MD  atorvastatin (LIPITOR) 20 MG tablet Take 1 tablet (20 mg total) by mouth daily at 6 PM. 06/25/15   Allie Bossier, MD  Blood Glucose Monitoring Suppl (TRUE METRIX METER) DEVI 1 each by Does not apply route 3 (three) times daily before meals. 07/19/15   [name], MD  busPIRone (BUSPAR) 5 MG tablet Take 1 tablet (5 mg total) by mouth 3 (three) times daily. 10/20/15   [name], MD  cetirizine (ZYRTEC) 10 MG tablet Take 1 tablet (10 mg total) by mouth daily. 10/20/15   [name], MD  docusate sodium (COLACE) 100 MG capsule Take 1 capsule (100 mg total) by mouth 2 (two) times daily. 09/05/15   Modena Jansky, MD  feeding supplement, GLUCERNA SHAKE, (GLUCERNA SHAKE) LIQD Take 237 mLs by mouth 3 (three) times daily between meals. 09/05/15  Modena Jansky, MD  Ferrous Fumarate (HEMOCYTE - 106 MG FE) 324 (106 Fe) MG TABS tablet Take 1 tablet (106 mg of iron total) by mouth 2 (two) times daily. 09/05/15   Modena Jansky, MD  FLUoxetine (PROZAC) 40 MG capsule Take 40 mg by mouth daily.    Historical Provider, MD  glucose blood (TRUE METRIX BLOOD GLUCOSE TEST) test strip Use 3 times daily before meals 07/19/15   [name], MD  insulin aspart (NOVOLOG) 100 UNIT/ML injection Inject 0-15 Units into the skin 3 (three) times daily with meals. Per sliding  scale 10/25/15   [name], MD  insulin detemir (LEVEMIR) 100 UNIT/ML injection Inject 0.25 mLs (25 Units total) into the skin daily. 10/25/15   [name], MD  Insulin Syringe-Needle U-100 (BD INSULIN SYRINGE ULTRAFINE) 31G X 15/64" 0.5 ML MISC 1 each by Does not apply route 4 (four) times daily as needed. 08/16/15   [name], MD  lisinopril (PRINIVIL,ZESTRIL) 5 MG tablet Take 1 tablet (5 mg total) by mouth daily. 09/20/15   [name], MD  metoCLOPramide (REGLAN) 10 MG tablet Take 1 tablet (10 mg total) by mouth every 6 (six) hours as needed for nausea. 11/16/15   Shanker Kristeen Mans, MD  ondansetron (ZOFRAN ODT) 4 MG disintegrating tablet Take 1 tablet (4 mg total) by mouth every 8 (eight) hours as needed for nausea. 11/16/15   Shanker Kristeen Mans, MD  pantoprazole (PROTONIX) 40 MG tablet Take 40 mg by mouth daily. Reported on 07/19/2015 01/04/15   Historical Provider, MD  polyethylene glycol powder (GLYCOLAX/MIRALAX) powder Take 17 g by mouth daily as needed for moderate constipation. Reported on 07/19/2015 04/06/15   Historical Provider, MD  pregabalin (LYRICA) 150 MG capsule Take 1 capsule (150 mg total) by mouth 2 (two) times daily. 10/25/15   [name], MD  promethazine (PHENERGAN) 25 MG suppository Place 1 suppository (25 mg total) rectally every 6 (six) hours as needed for nausea or vomiting. 09/20/15   [name], MD  TRUEPLUS LANCETS 28G MISC 1 each by Does not apply route 3 (three) times daily before meals. 07/19/15   [name], MD   BP 168/87 mmHg  Pulse 112  Resp 22  SpO2 99%  LMP 10/14/2015   Physical Exam  Constitutional: She is oriented to person, place, and time. She appears well-developed and well-nourished.  Dry heaving, intermittently vomiting; smells ketotic and fruity  HENT:  Head: Normocephalic and atraumatic.  Mouth/Throat: Oropharynx is clear and moist.  Eyes: Conjunctivae and EOM are normal. Pupils are equal, round, and reactive to light.  Neck: Normal range of  motion.  Cardiovascular: Normal rate, regular rhythm and normal heart sounds.   Pulmonary/Chest: Effort normal and breath sounds normal.  Abdominal: Soft. Bowel sounds are normal.  Abdomen soft, non-tender  Musculoskeletal: Normal range of motion.  Neurological: She is alert and oriented to person, place, and time.  Skin: Skin is warm and dry.  Psychiatric: She has a normal mood and affect.  Nursing note and vitals reviewed.   ED Course  Procedures (including critical care time)  CRITICAL CARE Performed by: Larene Pickett   Total critical care time: 60 minutes  Critical care time was exclusive of separately billable procedures and treating other patients.  Critical care was necessary to treat or prevent imminent or life-threatening deterioration.  Critical care was time spent personally by me on the following activities: development of treatment plan with patient and/or surrogate as well as nursing, discussions with  consultants, evaluation of patient's response to treatment, examination of patient, obtaining history from patient or surrogate, ordering and performing treatments and interventions, ordering and review of laboratory studies, ordering and review of radiographic studies, pulse oximetry and re-evaluation of patient's condition.  Medications  insulin regular (NOVOLIN R,HUMULIN R) 250 Units in sodium chloride 0.9 % 250 mL (1 Units/mL) infusion (7.4 Units/hr Intravenous Rate/Dose Change 11/27/15 1910)  dextrose 5 %-0.45 % sodium chloride infusion ( Intravenous Hold 11/27/15 1630)  0.9 %  sodium chloride infusion ( Intravenous New Bag/Given 11/27/15 1806)  ondansetron (ZOFRAN) injection 4 mg (4 mg Intravenous Given 11/27/15 1249)  sodium chloride 0.9 % bolus 1,000 mL (0 mLs Intravenous Stopped 11/27/15 1633)  promethazine (PHENERGAN) injection 25 mg (25 mg Intravenous Given 11/27/15 1312)  LORazepam (ATIVAN) injection 1 mg ( Intravenous Given 11/27/15 1456)  sodium chloride 0.9 % bolus  1,000 mL (0 mLs Intravenous Stopped 11/27/15 1805)  promethazine (PHENERGAN) injection 25 mg (25 mg Intravenous Given 11/27/15 1744)   Labs Review Labs Reviewed  COMPREHENSIVE METABOLIC PANEL - Abnormal; Notable for the following:    Sodium 134 (*)    Chloride 97 (*)    CO2 21 (*)    Glucose, Bld 419 (*)    Creatinine, Ser 1.09 (*)    Total Protein 8.4 (*)    Total Bilirubin 1.3 (*)    GFR calc non Af Amer 59 (*)    Anion gap 16 (*)    All other components within normal limits  CBC - Abnormal; Notable for the following:    Hemoglobin 9.5 (*)    HCT 30.6 (*)    MCV 70.3 (*)    MCH 21.8 (*)    RDW 17.1 (*)    Platelets 445 (*)    All other components within normal limits  URINALYSIS, ROUTINE W REFLEX MICROSCOPIC (NOT AT [facility]) - Abnormal; Notable for the following:    Glucose, UA >1000 (*)    Hgb urine dipstick TRACE (*)    Ketones, ur 40 (*)    Protein, ur 100 (*)    All other components within normal limits  BLOOD GAS, VENOUS - Abnormal; Notable for the following:    pH, Ven 7.410 (*)    pCO2, Ven 34.8 (*)    Acid-base deficit 2.1 (*)    All other components within normal limits  URINE MICROSCOPIC-ADD ON - Abnormal; Notable for the following:    Squamous Epithelial / LPF 0-5 (*)    All other components within normal limits  CBG MONITORING, ED - Abnormal; Notable for the following:    Glucose-Capillary 402 (*)    All other components within normal limits  CBG MONITORING, ED - Abnormal; Notable for the following:    Glucose-Capillary 424 (*)    All other components within normal limits  CBG MONITORING, ED - Abnormal; Notable for the following:    Glucose-Capillary 431 (*)    All other components within normal limits  LIPASE, BLOOD  BASIC METABOLIC PANEL  TROPONIN I  TROPONIN I  TROPONIN I  URINE RAPID DRUG SCREEN, HOSP PERFORMED  I-STAT BETA HCG BLOOD, ED ([facility], [facility], [facility] ONLY)    Imaging Review No results found. I have personally reviewed and evaluated these images and  lab results as part of my medical decision-making.   EKG Interpretation None      MDM   Final diagnoses:  Diabetic ketoacidosis without coma associated with type 1 diabetes mellitus (HCC)  Nausea and vomiting, vomiting of unspecified  type   50 y.o. F here with N/V.  Seen here Frequently for similar complaints with multiple admissions for DKA. On exam patient is dry heaving with intermittent emesis. She does smell ketotic. She is afebrile. Abdomen is soft and benign. Will plan for labs including CBC, CMP, lipase, u/a, VBG.  CBG on arrival 402.  IVF started.  IV zofran given.  Patient's labs with very mild DKA noted-- anion gap 16, bicarb 21.  U/a with 40 ketones.  Patient's vomiting better controlled, continues to complain of nausea.  Patient has had multiple admissions for similar symptoms in the past, will try to avoid unnecessary admission given her mild states today.  She will be started of glucostabilizer for 2 hours.  Will plan for repeat BMP after.  If closes gap and bicarb returns to [facility], may be able to d/c home.  Discussed with attending physician, Dr. Cheral Bay- who agrees with plan.  7:19 PM Patient has been on glucostabilizer for 2 hours now, yet her CBG continues to rise.   She has not been given anything PO here yet because she continues to complain of nausea.  Patient does not seem to be improving here in ED.  Will require admission.  Repeat BMP pending.  Hospitalist, Dr. Marvis Repress, to evaluated in the ED and will admit to step-down.  Larene Pickett, PA-C 11/27/15 2040  Varney Biles, MD 11/28/15 1517

## 2015-11-27 NOTE — ED Notes (Signed)
IV RN assessed pts IV, stated to this RN that PIV is functioning properly w/ blood return.

## 2015-11-27 NOTE — ED Notes (Signed)
RN will start IV and collect labs 

## 2015-11-28 DIAGNOSIS — I4581 Long QT syndrome: Secondary | ICD-10-CM | POA: Diagnosis not present

## 2015-11-28 DIAGNOSIS — F4323 Adjustment disorder with mixed anxiety and depressed mood: Secondary | ICD-10-CM | POA: Diagnosis not present

## 2015-11-28 DIAGNOSIS — D519 Vitamin B12 deficiency anemia, unspecified: Secondary | ICD-10-CM | POA: Diagnosis not present

## 2015-11-28 DIAGNOSIS — D509 Iron deficiency anemia, unspecified: Secondary | ICD-10-CM | POA: Diagnosis not present

## 2015-11-28 DIAGNOSIS — E1010 Type 1 diabetes mellitus with ketoacidosis without coma: Secondary | ICD-10-CM | POA: Diagnosis not present

## 2015-11-28 DIAGNOSIS — E1040 Type 1 diabetes mellitus with diabetic neuropathy, unspecified: Secondary | ICD-10-CM | POA: Diagnosis not present

## 2015-11-28 LAB — BASIC METABOLIC PANEL
ANION GAP: 8 (ref 5–15)
Anion gap: 11 (ref 5–15)
Anion gap: 10 (ref 5–15)
Anion gap: 9 (ref 5–15)
BUN: 16 mg/dL (ref 6–20)
BUN: 17 mg/dL (ref 6–20)
BUN: 15 mg/dL (ref 6–20)
BUN: 13 mg/dL (ref 6–20)
CHLORIDE: 112 mmol/L — AB (ref 101–111)
CHLORIDE: 111 mmol/L (ref 101–111)
CHLORIDE: 112 mmol/L — AB (ref 101–111)
CHLORIDE: 113 mmol/L — AB (ref 101–111)
CO2: 18 mmol/L — ABNORMAL LOW (ref 22–32)
CO2: 16 mmol/L — AB (ref 22–32)
CO2: 12 mmol/L — ABNORMAL LOW (ref 22–32)
CO2: 15 mmol/L — AB (ref 22–32)
CREATININE: 1.33 mg/dL — AB (ref 0.44–1.00)
CREATININE: 1.19 mg/dL — AB (ref 0.44–1.00)
CREATININE: 1.03 mg/dL — AB (ref 0.44–1.00)
Calcium: 8.9 mg/dL (ref 8.9–10.3)
Calcium: 8.8 mg/dL — ABNORMAL LOW (ref 8.9–10.3)
Calcium: 8.7 mg/dL — ABNORMAL LOW (ref 8.9–10.3)
Calcium: 8.5 mg/dL — ABNORMAL LOW (ref 8.9–10.3)
Creatinine, Ser: 1.31 mg/dL — ABNORMAL HIGH (ref 0.44–1.00)
GFR calc Af Amer: >60 mL/min (ref >60)
GFR calc non Af Amer: 46 mL/min — ABNORMAL LOW (ref >60)
GFR calc non Af Amer: >60 mL/min (ref >60)
GFR, EST AFRICAN AMERICAN: 55 mL/min — AB (ref >60)
GFR, EST AFRICAN AMERICAN: 54 mL/min — AB (ref >60)
GFR, EST NON AFRICAN AMERICAN: 47 mL/min — AB (ref >60)
GFR, EST NON AFRICAN AMERICAN: 53 mL/min — AB (ref >60)
GLUCOSE: 153 mg/dL — AB (ref 65–99)
Glucose, Bld: 196 mg/dL — ABNORMAL HIGH (ref 65–99)
Glucose, Bld: 120 mg/dL — ABNORMAL HIGH (ref 65–99)
Glucose, Bld: 277 mg/dL — ABNORMAL HIGH (ref 65–99)
POTASSIUM: 4.1 mmol/L (ref 3.5–5.1)
POTASSIUM: 4.2 mmol/L (ref 3.5–5.1)
POTASSIUM: 4.2 mmol/L (ref 3.5–5.1)
Potassium: 3.7 mmol/L (ref 3.5–5.1)
SODIUM: 138 mmol/L (ref 135–145)
SODIUM: 138 mmol/L (ref 135–145)
SODIUM: 134 mmol/L — AB (ref 135–145)
Sodium: 137 mmol/L (ref 135–145)

## 2015-11-28 LAB — GLUCOSE, CAPILLARY
GLUCOSE-CAPILLARY: 214 mg/dL — AB (ref 65–99)
GLUCOSE-CAPILLARY: 196 mg/dL — AB (ref 65–99)
GLUCOSE-CAPILLARY: 137 mg/dL — AB (ref 65–99)
GLUCOSE-CAPILLARY: 100 mg/dL — AB (ref 65–99)
GLUCOSE-CAPILLARY: 117 mg/dL — AB (ref 65–99)
GLUCOSE-CAPILLARY: 174 mg/dL — AB (ref 65–99)
GLUCOSE-CAPILLARY: 327 mg/dL — AB (ref 65–99)
GLUCOSE-CAPILLARY: 179 mg/dL — AB (ref 65–99)
GLUCOSE-CAPILLARY: 130 mg/dL — AB (ref 65–99)
Glucose-Capillary: 202 mg/dL — ABNORMAL HIGH (ref 65–99)
Glucose-Capillary: 108 mg/dL — ABNORMAL HIGH (ref 65–99)
Glucose-Capillary: 282 mg/dL — ABNORMAL HIGH (ref 65–99)
Glucose-Capillary: 146 mg/dL — ABNORMAL HIGH (ref 65–99)
Glucose-Capillary: 153 mg/dL — ABNORMAL HIGH (ref 65–99)
Glucose-Capillary: 158 mg/dL — ABNORMAL HIGH (ref 65–99)
Glucose-Capillary: 160 mg/dL — ABNORMAL HIGH (ref 65–99)
Glucose-Capillary: 76 mg/dL (ref 65–99)

## 2015-11-28 LAB — RETICULOCYTES
RBC.: 4.03 MIL/uL (ref 3.87–5.11)
RETIC CT PCT: 1.6 % (ref 0.4–3.1)
Retic Count, Absolute: 64.5 10*3/uL (ref 19.0–186.0)

## 2015-11-28 LAB — TROPONIN I
Troponin I: <0.03 ng/mL (ref <0.031)
Troponin I: <0.03 ng/mL (ref <0.031)

## 2015-11-28 MED ORDER — PROCHLORPERAZINE EDISYLATE 5 MG/ML IJ SOLN
10.0000 mg | Freq: Four times a day (QID) | INTRAMUSCULAR | Status: DC | PRN
  Administered 2015-11-28: 10 mg via INTRAVENOUS
  Filled 2015-11-28: qty 2

## 2015-11-28 MED ORDER — ONDANSETRON HCL 4 MG/2ML IJ SOLN: 4.0000 mg | Freq: Four times a day (QID) | INTRAMUSCULAR | Status: DC | PRN

## 2015-11-28 MED ORDER — FLUOXETINE HCL 20 MG PO CAPS
40.0000 mg | ORAL_CAPSULE | Freq: Every day | ORAL | Status: DC
  Administered 2015-11-28 – 2015-12-01 (×4): 40 mg via ORAL
  Filled 2015-11-28 (×4): qty 2

## 2015-11-28 MED ORDER — INSULIN ASPART 100 UNIT/ML ~~LOC~~ SOLN: 0.0000 [IU] | Freq: Every day | SUBCUTANEOUS | Status: DC

## 2015-11-28 MED ORDER — ACETAMINOPHEN 325 MG PO TABS
650.0000 mg | ORAL_TABLET | ORAL | Status: DC | PRN
  Administered 2015-11-28 – 2015-11-29 (×2): 650 mg via ORAL
  Filled 2015-11-28 (×2): qty 2

## 2015-11-28 MED ORDER — ONDANSETRON HCL 4 MG/2ML IJ SOLN
4.0000 mg | Freq: Four times a day (QID) | INTRAMUSCULAR | Status: DC | PRN
  Administered 2015-11-28 – 2015-11-30 (×6): 4 mg via INTRAVENOUS
  Filled 2015-11-28 (×6): qty 2

## 2015-11-28 MED ORDER — METOCLOPRAMIDE HCL 5 MG/ML IJ SOLN
10.0000 mg | Freq: Four times a day (QID) | INTRAMUSCULAR | Status: DC
  Administered 2015-11-28 – 2015-11-30 (×10): 10 mg via INTRAVENOUS
  Filled 2015-11-28 (×11): qty 2

## 2015-11-28 MED ORDER — PANTOPRAZOLE SODIUM 40 MG PO TBEC
40.0000 mg | DELAYED_RELEASE_TABLET | Freq: Every day | ORAL | Status: DC
  Administered 2015-11-28 – 2015-12-01 (×4): 40 mg via ORAL
  Filled 2015-11-28 (×4): qty 1

## 2015-11-28 MED ORDER — PROCHLORPERAZINE EDISYLATE 5 MG/ML IJ SOLN: 10.0000 mg | Freq: Four times a day (QID) | INTRAMUSCULAR | Status: DC | PRN

## 2015-11-28 MED ORDER — INSULIN ASPART 100 UNIT/ML ~~LOC~~ SOLN
0.0000 [IU] | Freq: Three times a day (TID) | SUBCUTANEOUS | Status: DC
  Administered 2015-11-29: 3 [IU] via SUBCUTANEOUS
  Administered 2015-11-29: 11 [IU] via SUBCUTANEOUS
  Administered 2015-11-30: 2 [IU] via SUBCUTANEOUS
  Administered 2015-11-30 – 2015-12-01 (×2): 3 [IU] via SUBCUTANEOUS

## 2015-11-28 MED ORDER — SODIUM CHLORIDE 0.9 % IV SOLN
INTRAVENOUS | Status: DC
  Administered 2015-11-28 – 2015-11-29 (×2): via INTRAVENOUS
  Administered 2015-11-30: 1000 mL via INTRAVENOUS

## 2015-11-28 MED ORDER — HYDRALAZINE HCL 20 MG/ML IJ SOLN
5.0000 mg | Freq: Once | INTRAMUSCULAR | Status: AC
  Administered 2015-11-28: 5 mg via INTRAVENOUS
  Filled 2015-11-28: qty 1

## 2015-11-28 MED ORDER — INSULIN DETEMIR 100 UNIT/ML ~~LOC~~ SOLN
25.0000 [IU] | Freq: Every day | SUBCUTANEOUS | Status: DC
  Administered 2015-11-28: 25 [IU] via SUBCUTANEOUS
  Filled 2015-11-28 (×2): qty 0.25

## 2015-11-28 NOTE — Progress Notes (Signed)
Inpatient Diabetes Program Recommendations  AACE/ADA: New Consensus Statement on Inpatient Glycemic Control (2015)  Target Ranges:  Prepandial:   less than 140 mg/dL      Peak postprandial:   less than 180 mg/dL (1-2 hours)      Critically ill patients:  140 - 180 mg/dL   Lab Results  Component Value Date   GLUCAP 174* 11/28/2015   HGBA1C 8.7* 07/29/2015    Review of Glycemic Control  Diabetes history: DM1 Outpatient Diabetes medications: Levemir 25 units Current orders for Inpatient glycemic control: GlucoStabilizer per DKA order set  Inpatient Diabetes Program Recommendations:    When criteria met for transition to SQ insulin, give Levemir 2 hours prior to discontinuation of drip Levemir  20 units Q24H Novolog -  use a very sensitive custom correction scale that begins with 1 unit at 150, 2 units at 200, 3 units at 250, 4 units at 300, 6 units at 350, 8 units at 400 mg/dL. For meal coverage insulin - 2-3 units tid AFTER meal if pt eats > 50% meal. Need updated HgbA1C.  To speak with pt's son when he comes to visit regarding pt not taking insulin at home.  Will follow. Thank you. Lorenda Peck, RD, LDN, CDE Inpatient Diabetes Coordinator [phone]

## 2015-11-28 NOTE — Hospital Discharge Follow-Up (Signed)
The patient is known to the [facility] and has an appointment scheduled for 12/09/15 @ 1130 with Dr Jarold Song. The visit information was placed on the AVS. Will continue to follow her hospital course.

## 2015-11-28 NOTE — Care Management Note (Signed)
Case Management Note  Patient Details  Name: [name] MRN: [id number] Date of Birth: 1965-06-26  Subjective/Objective:         dka and hypotension           Action/Plan:Date:  November 28, 2015 Chart reviewed for concurrent status and case management needs. Will continue to follow the patient for changes and needs: Expected discharge date: [id number] Velva Harman, BSN, [place], [place]   Expected Discharge Date:                  Expected Discharge Plan:  Home/Self Care  In-House Referral:  NA  Discharge planning Services  CM Consult  Post Acute Care Choice:  NA Choice offered to:  NA  DME Arranged:    DME Agency:  NA  HH Arranged:  NA HH Agency:  NA  Status of Service:  In process, will continue to follow  Medicare Important Message Given:    Date Medicare IM Given:    Medicare IM give by:    Date Additional Medicare IM Given:    Additional Medicare Important Message give by:     If discussed at [place] of Stay Meetings, dates discussed:    Additional Comments:  Leeroy Cha, RN 11/28/2015, 10:14 AM

## 2015-11-28 NOTE — Progress Notes (Signed)
PROGRESS NOTE    [name]  [id number] DOB: May 16, 1966 DOA: 11/27/2015  PCP: [name], MD   Brief Narrative:  [name] is a 50 y.o. female with medical history significant of DM 1, gastroparesis, neuropathy, GERD Chronic non-AG acidosis at baseline number hyperlipidemia, depression Presented with few hours symptoms of nausea, non-bloody vomiting and abdominal pain similar to prior episodes of gastroparesis this started since 5 AM on the morning of admission. 5th hospital admission this year.   Subjective: Feeling nauseated and anxious this AM. Received Ativan and Compazine, fell asleep, was aroused for blood work 15-20 min later and asked for more Ativan and some Phenergan. Fell asleep after blood draw.   Assessment & Plan:   Active Problems:   Diabetic ketoacidosis without coma associated with type 1 diabetes mellitus  - Give Levemir, control nausea and transition off insulin infusion - last A1c in Feb was 8.7- will re-order - advised when she was last here to take her Levemir even if she is vomiting- is not able to tell me if she did this   Diabetic gastroparesis - resume Reglan QID     Anemia, iron deficiency - cont Iron when nausea resolves or if this makes her nauseated, may need to do Iron infusions - repeat Anemia panel- last on done in March    Prolonged QT interval - repeat EKG- follow   HTN - hold Lisinopril  Anxiety - Buspar TID - Prozac  Neuropathy, diabetic - hold Lyrica until able to keep food down    DVT prophylaxis: Lovenox Code Status: full code Family Communication: son Disposition Plan: home when stable Consultants:   none Procedures:   None  Antimicrobials:  Anti-infectives    None      Objective: Filed Vitals:   11/28/15 0700 11/28/15 0800 11/28/15 1000 11/28/15 1100  BP:  169/77 169/62   Pulse:  108 111   Temp: 98.6 F (37 C)   98.1 F (36.7 C)  TempSrc: Oral   Oral  Resp:  19 25   Height:      Weight:       SpO2:  98% 100%     Intake/Output Summary (Last 24 hours) at 11/28/15 1211 Last data filed at 11/28/15 0840  Gross per 24 hour  Intake 1846.3 ml  Output    750 ml  Net 1096.3 ml   Filed Weights   11/27/15 2200  Weight: 61 kg (134 lb 7.7 oz)    Examination: General exam: Appears confused and restless HEENT: PERRLA, oral mucosa dry, no sclera icterus or thrush Respiratory system: Clear to auscultation. Respiratory effort normal. Cardiovascular system: S1 & S2 heard, RRR.  No murmurs  Gastrointestinal system: Abdomen soft, non-tender, nondistended. Normal bowel sound. No organomegaly Central nervous system:   No focal neurological deficits. Extremities: No cyanosis, clubbing or edema Skin: No rashes or ulcers Psychiatry:  Mood & affect appropriate.     Data Reviewed: I have personally reviewed following labs and imaging studies  CBC:  Recent Labs Lab 11/27/15 1300  WBC 8.7  HGB 9.5*  HCT 30.6*  MCV 70.3*  PLT [id number]*   Basic Metabolic Panel:  Recent Labs Lab 11/27/15 2039 11/27/15 2305 11/28/15 0232 11/28/15 0730 11/28/15 1105  NA 137 139 138 138 134*  K 4.5 4.1 4.1 4.2 4.2  CL 105 113* 112* 111 112*  CO2 14* 11* 18* 16* 12*  GLUCOSE 385* 243* 196* 120* 277*  BUN 14 14 16 17 15   CREATININE 1.11* 1.09*  1.31* 1.33* 1.19*  CALCIUM 9.8 9.2 8.9 8.8* 8.7*  MG  --  2.0  --   --   --   PHOS  --  2.6  --   --   --    GFR: Estimated Creatinine Clearance: 49.7 mL/min (by C-G formula based on Cr of 1.19). Liver Function Tests:  Recent Labs Lab 11/27/15 1300  AST 23  ALT 19  ALKPHOS 105  BILITOT 1.3*  PROT 8.4*  ALBUMIN 4.3    Recent Labs Lab 11/27/15 1300  LIPASE 22   No results for input(s): AMMONIA in the last 168 hours. Coagulation Profile: No results for input(s): INR, PROTIME in the last 168 hours. Cardiac Enzymes:  Recent Labs Lab 11/27/15 2039 11/28/15 0232 11/28/15 0730  TROPONINI <0.03 <0.03 <0.03   BNP (last 3 results) No  results for input(s): PROBNP in the last 8760 hours. HbA1C: No results for input(s): HGBA1C in the last 72 hours. CBG:  Recent Labs Lab 11/28/15 0723 11/28/15 0831 11/28/15 0930 11/28/15 1053 11/28/15 1202  GLUCAP 117* 174* 327* 282* 179*   Lipid Profile: No results for input(s): CHOL, HDL, LDLCALC, TRIG, CHOLHDL, LDLDIRECT in the last 72 hours. Thyroid Function Tests: No results for input(s): TSH, T4TOTAL, FREET4, T3FREE, THYROIDAB in the last 72 hours. Anemia Panel: No results for input(s): VITAMINB12, FOLATE, FERRITIN, TIBC, IRON, RETICCTPCT in the last 72 hours. Urine analysis:    Component Value Date/Time   COLORURINE YELLOW 11/27/2015 1230   APPEARANCEUR CLEAR 11/27/2015 1230   LABSPEC 1.026 11/27/2015 1230   PHURINE 7.0 11/27/2015 1230   GLUCOSEU >1000* 11/27/2015 1230   HGBUR TRACE* 11/27/2015 1230   BILIRUBINUR NEGATIVE 11/27/2015 1230   BILIRUBINUR NEGATIVE 08/04/2012 1005   KETONESUR 40* 11/27/2015 1230   PROTEINUR 100* 11/27/2015 1230   PROTEINUR >=300 08/04/2012 1005   UROBILINOGEN 0.2 04/27/2015 1608   UROBILINOGEN 0.2 08/04/2012 1005   NITRITE NEGATIVE 11/27/2015 1230   NITRITE POSITIVE 08/04/2012 1005   LEUKOCYTESUR NEGATIVE 11/27/2015 1230   Sepsis Labs: @LABRCNTIP (procalcitonin:4,lacticidven:4) )No results found for this or any previous visit (from the past 240 hour(s)).       Radiology Studies: No results found.    Scheduled Meds: . busPIRone  5 mg Oral TID  . enoxaparin (LOVENOX) injection  40 mg Subcutaneous QHS  . Ferrous Fumarate  1 tablet Oral BID  . insulin detemir  25 Units Subcutaneous Daily  . loratadine  10 mg Oral Daily   Continuous Infusions: . sodium chloride Stopped (11/28/15 0000)  . dextrose 5 % and 0.45% NaCl 125 mL/hr at 11/28/15 0826  . insulin (NOVOLIN-R) infusion 6.7 Units/hr (11/28/15 1103)     LOS: 1 day    Time spent in minutes: 81    [place], MD [facility] Pager: [email or web address] Password [email or web address] 11/28/2015, 12:11 PM

## 2015-11-29 DIAGNOSIS — E1010 Type 1 diabetes mellitus with ketoacidosis without coma: Secondary | ICD-10-CM | POA: Diagnosis not present

## 2015-11-29 DIAGNOSIS — F4323 Adjustment disorder with mixed anxiety and depressed mood: Secondary | ICD-10-CM

## 2015-11-29 DIAGNOSIS — E1040 Type 1 diabetes mellitus with diabetic neuropathy, unspecified: Secondary | ICD-10-CM | POA: Diagnosis not present

## 2015-11-29 DIAGNOSIS — D519 Vitamin B12 deficiency anemia, unspecified: Secondary | ICD-10-CM

## 2015-11-29 LAB — CBC
HCT: 28.9 % — ABNORMAL LOW (ref 36.0–46.0)
HEMOGLOBIN: 8.9 g/dL — AB (ref 12.0–15.0)
MCH: 22.2 pg — AB (ref 26.0–34.0)
MCHC: 30.8 g/dL (ref 30.0–36.0)
MCV: 72.1 fL — ABNORMAL LOW (ref 78.0–100.0)
Platelets: 412 10*3/uL — ABNORMAL HIGH (ref 150–400)
RBC: 4.01 MIL/uL (ref 3.87–5.11)
RDW: 18 % — ABNORMAL HIGH (ref 11.5–15.5)
WBC: 10.2 10*3/uL (ref 4.0–10.5)

## 2015-11-29 LAB — IRON AND TIBC
Iron: 14 ug/dL — ABNORMAL LOW (ref 28–170)
Saturation Ratios: 3 % — ABNORMAL LOW (ref 10.4–31.8)
TIBC: 483 ug/dL — ABNORMAL HIGH (ref 250–450)
UIBC: 469 ug/dL

## 2015-11-29 LAB — HEMOGLOBIN A1C
HEMOGLOBIN A1C: 11.1 % — AB (ref 4.8–5.6)
Mean Plasma Glucose: 272 mg/dL

## 2015-11-29 LAB — BASIC METABOLIC PANEL
ANION GAP: 10 (ref 5–15)
BUN: 8 mg/dL (ref 6–20)
CALCIUM: 8.4 mg/dL — AB (ref 8.9–10.3)
CHLORIDE: 108 mmol/L (ref 101–111)
CO2: 18 mmol/L — AB (ref 22–32)
Creatinine, Ser: 0.85 mg/dL (ref 0.44–1.00)
GFR calc Af Amer: >60 mL/min (ref >60)
GFR calc non Af Amer: >60 mL/min (ref >60)
GLUCOSE: 151 mg/dL — AB (ref 65–99)
POTASSIUM: 3.4 mmol/L — AB (ref 3.5–5.1)
Sodium: 136 mmol/L (ref 135–145)

## 2015-11-29 LAB — GLUCOSE, CAPILLARY
GLUCOSE-CAPILLARY: 287 mg/dL — AB (ref 65–99)
GLUCOSE-CAPILLARY: 115 mg/dL — AB (ref 65–99)
Glucose-Capillary: 172 mg/dL — ABNORMAL HIGH (ref 65–99)
Glucose-Capillary: 124 mg/dL — ABNORMAL HIGH (ref 65–99)

## 2015-11-29 LAB — VITAMIN B12: VITAMIN B 12: 168 pg/mL — AB (ref 180–914)

## 2015-11-29 LAB — FOLATE: Folate: 36.4 ng/mL (ref >5.9)

## 2015-11-29 LAB — FERRITIN: FERRITIN: 9 ng/mL — AB (ref 11–307)

## 2015-11-29 IMAGING — DX DG ABD PORTABLE 1V
1 series · 1 of 1 positions shown · non-contrast
Comparison: 11/15/2014

CLINICAL DATA: Abdominal pain for 1 day

EXAM:
PORTABLE ABDOMEN - 1 VIEW

[abdomen kub]
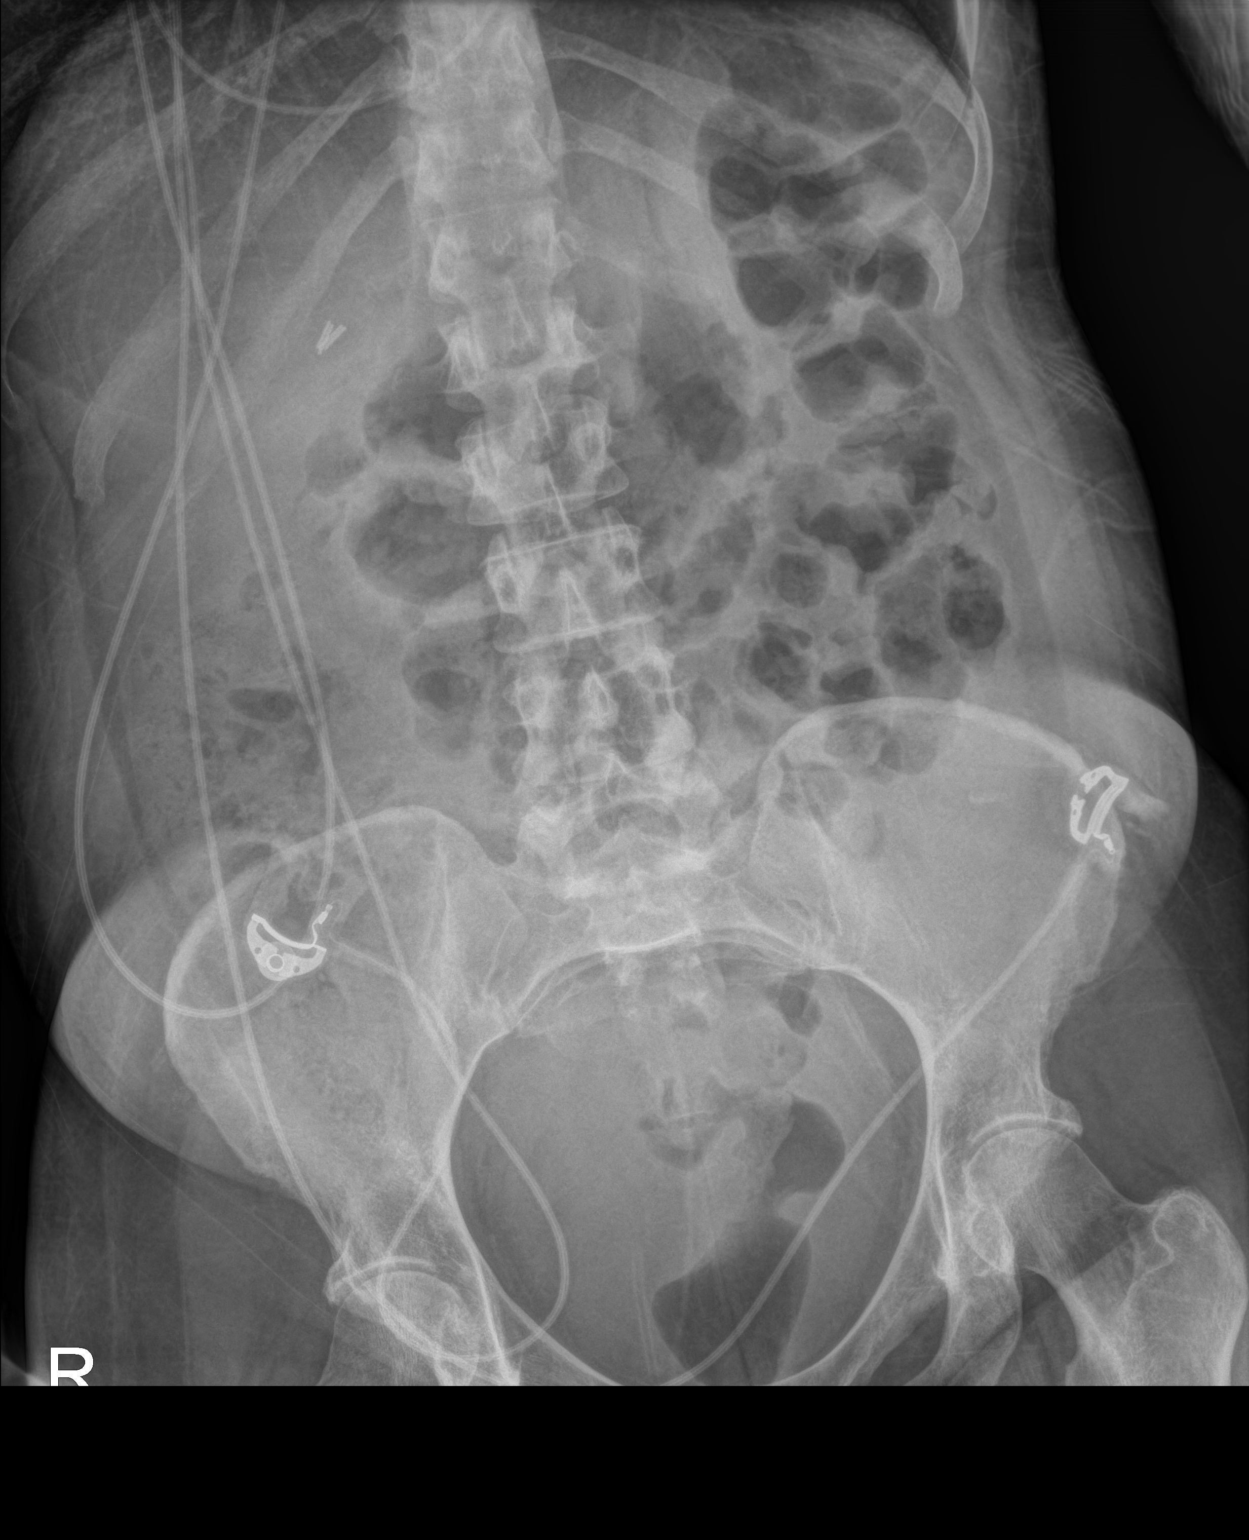

[1 of 1 positions shown; findings below may reference images not displayed]

FINDINGS: Scattered large and small bowel gas is noted. Changes consistent
with prior cholecystectomy are seen. No bony abnormality is seen. No
abnormal mass or abnormal calcifications are noted.
IMPRESSION: No acute abnormality noted.

## 2015-11-29 MED ORDER — METOPROLOL TARTRATE 5 MG/5ML IV SOLN: 5.0000 mg | INTRAVENOUS | Status: DC | PRN

## 2015-11-29 MED ORDER — SODIUM CHLORIDE 0.9 % IV SOLN
250.0000 mg | Freq: Once | INTRAVENOUS | Status: AC
  Administered 2015-11-29: 250 mg via INTRAVENOUS
  Filled 2015-11-29 (×2): qty 20

## 2015-11-29 MED ORDER — NA FERRIC GLUC CPLX IN SUCROSE 12.5 MG/ML IV SOLN
25.0000 mg | Freq: Once | INTRAVENOUS | Status: AC
  Administered 2015-11-29: 25 mg via INTRAVENOUS
  Filled 2015-11-29: qty 2

## 2015-11-29 MED ORDER — CYANOCOBALAMIN 1000 MCG/ML IJ SOLN
1000.0000 ug | Freq: Every day | INTRAMUSCULAR | Status: DC
  Administered 2015-11-29 – 2015-12-01 (×3): 1000 ug via SUBCUTANEOUS
  Filled 2015-11-29 (×4): qty 1

## 2015-11-29 MED ORDER — AMLODIPINE BESYLATE 10 MG PO TABS
10.0000 mg | ORAL_TABLET | Freq: Every day | ORAL | Status: DC
  Administered 2015-11-29 – 2015-11-30 (×2): 10 mg via ORAL
  Filled 2015-11-29 (×2): qty 1

## 2015-11-29 MED ORDER — POTASSIUM CHLORIDE CRYS ER 20 MEQ PO TBCR
40.0000 meq | EXTENDED_RELEASE_TABLET | ORAL | Status: AC
  Administered 2015-11-29 (×2): 40 meq via ORAL
  Filled 2015-11-29 (×2): qty 2

## 2015-11-29 MED ORDER — INSULIN DETEMIR 100 UNIT/ML ~~LOC~~ SOLN
28.0000 [IU] | Freq: Every day | SUBCUTANEOUS | Status: DC
  Administered 2015-11-29 – 2015-12-01 (×3): 28 [IU] via SUBCUTANEOUS
  Filled 2015-11-29 (×3): qty 0.28

## 2015-11-29 MED ORDER — HYDRALAZINE HCL 20 MG/ML IJ SOLN
10.0000 mg | Freq: Once | INTRAMUSCULAR | Status: AC
  Administered 2015-11-29: 10 mg via INTRAVENOUS
  Filled 2015-11-29: qty 1

## 2015-11-29 NOTE — Progress Notes (Signed)
Received report.wbb

## 2015-11-29 NOTE — Progress Notes (Signed)
Inpatient Diabetes Program Recommendations  AACE/ADA: New Consensus Statement on Inpatient Glycemic Control (2015)  Target Ranges:  Prepandial:   less than 140 mg/dL      Peak postprandial:   less than 180 mg/dL (1-2 hours)      Critically ill patients:  140 - 180 mg/dL   Lab Results  Component Value Date   GLUCAP 287* 11/29/2015   HGBA1C 11.1* 11/28/2015    Review of Glycemic Control  HgbA1C indicates pt non-compliant with taking her insulin. Continues to skip her Levemir when she is nauseated. Will continue to stress importance of checking blood sugars, taking Levemir every day, and Novolog AFTER meals with gastroparesis.  Inpatient Diabetes Program Recommendations:    When po intake increases, add Novolog 3 units tidwc for meal coverage insulin. Has been very sensitive to insulin in previous hospital stays - would decrease Novolog to sensitive tidwc and hs.  Will follow. Thank you. Lorenda Peck, RD, LDN, CDE Inpatient Diabetes Coordinator [phone]

## 2015-11-29 NOTE — Progress Notes (Signed)
PROGRESS NOTE    [name]  [id number] DOB: 1965-09-06 DOA: 11/27/2015  PCP: [name], MD   Brief Narrative:  [name] is a 50 y.o. female with medical history significant of DM 1, gastroparesis, neuropathy, GERD Chronic non-AG acidosis at baseline number hyperlipidemia, depression Presented with few hours symptoms of nausea, non-bloody vomiting and abdominal pain similar to prior episodes of gastroparesis this started since 5 AM on the morning of admission. 5th hospital admission this year.   Subjective: Sleeping most of the day. As soon a she is awakened, complains of having a panic attack and asks for Ativan. No appetite. Not vomiting. Has abdominal pain from her gastroparesis and is asking for Morphine.   Assessment & Plan:   Active Problems:   Diabetic ketoacidosis without coma associated with type 1 diabetes mellitus  - increase Levemir, control nausea   - last A1c in Feb was 8.7- it is now 11.1  Diabetic gastroparesis - resumed Reglan QID     Anemia, iron deficiency - give Iron infusion as Ferretin is 9 and start B12 injections as B12 is 168    Prolonged QT interval - repeat EKG shows normal QT interval now   HTN - hold Lisinopril due to AKI - start Norvasc and use PRN Metoprolol  Anxiety - Buspar TID - Prozac - avoid Ativan  Neuropathy, diabetic - hold Lyrica until able to keep food down    DVT prophylaxis: Lovenox Code Status: full code Family Communication: son Disposition Plan: home when stable Consultants:   none Procedures:   None  Antimicrobials:  Anti-infectives    None      Objective: Filed Vitals:   11/29/15 0024 11/29/15 0551 11/29/15 0800 11/29/15 1050  BP: 172/72 181/84 186/89 163/83  Pulse: 113 117 115 112  Temp: 98.7 F (37.1 C)  98.4 F (36.9 C) 98.3 F (36.8 C)  TempSrc:   Oral Oral  Resp: 32 25 18 20   Height:    5\' 2"  (1.575 m)  Weight:    62.6 kg (138 lb 0.1 oz)  SpO2: 100% 100% 99% 100%     Intake/Output Summary (Last 24 hours) at 11/29/15 1133 Last data filed at 11/29/15 1000  Gross per 24 hour  Intake 2857.29 ml  Output   1200 ml  Net 1657.29 ml   Filed Weights   11/27/15 2200 11/29/15 1050  Weight: 61 kg (134 lb 7.7 oz) 62.6 kg (138 lb 0.1 oz)    Examination: General exam: Appears anxious HEENT: PERRLA, oral mucosa dry, no sclera icterus or thrush Respiratory system: Clear to auscultation. Respiratory effort normal. Cardiovascular system: S1 & S2 heard, RRR.  No murmurs  Gastrointestinal system: Abdomen soft, mild diffuse tenderness, nondistended. Normal bowel sound. No organomegaly Central nervous system:   No focal neurological deficits. Extremities: No cyanosis, clubbing or edema Skin: No rashes or ulcers Psychiatry:  Mood & affect appropriate.     Data Reviewed: I have personally reviewed following labs and imaging studies  CBC:  Recent Labs Lab 11/27/15 1300 11/29/15 0340  WBC 8.7 10.2  HGB 9.5* 8.9*  HCT 30.6* 28.9*  MCV 70.3* 72.1*  PLT 445* 123456*   Basic Metabolic Panel:  Recent Labs Lab 11/27/15 2305 11/28/15 0232 11/28/15 0730 11/28/15 1105 11/28/15 1548 11/29/15 0340  NA 139 138 138 134* 137 136  K 4.1 4.1 4.2 4.2 3.7 3.4*  CL 113* 112* 111 112* 113* 108  CO2 11* 18* 16* 12* 15* 18*  GLUCOSE 243* 196* 120* 277*  153* 151*  BUN 14 16 17 15 13 8   CREATININE 1.09* 1.31* 1.33* 1.19* 1.03* 0.85  CALCIUM 9.2 8.9 8.8* 8.7* 8.5* 8.4*  MG 2.0  --   --   --   --   --   PHOS 2.6  --   --   --   --   --    GFR: Estimated Creatinine Clearance: 70.4 mL/min (by C-G formula based on Cr of 0.85). Liver Function Tests:  Recent Labs Lab 11/27/15 1300  AST 23  ALT 19  ALKPHOS 105  BILITOT 1.3*  PROT 8.4*  ALBUMIN 4.3    Recent Labs Lab 11/27/15 1300  LIPASE 22   No results for input(s): AMMONIA in the last 168 hours. Coagulation Profile: No results for input(s): INR, PROTIME in the last 168 hours. Cardiac  Enzymes:  Recent Labs Lab 11/27/15 2039 11/28/15 0232 11/28/15 0730  TROPONINI <0.03 <0.03 <0.03   BNP (last 3 results) No results for input(s): PROBNP in the last 8760 hours. HbA1C:  Recent Labs  11/28/15 0232  HGBA1C 11.1*   CBG:  Recent Labs Lab 11/28/15 1518 11/28/15 1625 11/28/15 1750 11/28/15 2155 11/29/15 0810  GLUCAP 160* 158* 153* 76 287*   Lipid Profile: No results for input(s): CHOL, HDL, LDLCALC, TRIG, CHOLHDL, LDLDIRECT in the last 72 hours. Thyroid Function Tests: No results for input(s): TSH, T4TOTAL, FREET4, T3FREE, THYROIDAB in the last 72 hours. Anemia Panel:  Recent Labs  11/28/15 2141 11/29/15 0340  VITAMINB12  --  168*  FOLATE 36.4  --   FERRITIN 9*  --   TIBC 483*  --   IRON 14*  --   RETICCTPCT 1.6  --    Urine analysis:    Component Value Date/Time   COLORURINE YELLOW 11/27/2015 1230   APPEARANCEUR CLEAR 11/27/2015 1230   LABSPEC 1.026 11/27/2015 1230   PHURINE 7.0 11/27/2015 1230   GLUCOSEU >1000* 11/27/2015 1230   HGBUR TRACE* 11/27/2015 1230   BILIRUBINUR NEGATIVE 11/27/2015 1230   BILIRUBINUR NEGATIVE 08/04/2012 1005   KETONESUR 40* 11/27/2015 1230   PROTEINUR 100* 11/27/2015 1230   PROTEINUR >=300 08/04/2012 1005   UROBILINOGEN 0.2 04/27/2015 1608   UROBILINOGEN 0.2 08/04/2012 1005   NITRITE NEGATIVE 11/27/2015 1230   NITRITE POSITIVE 08/04/2012 1005   LEUKOCYTESUR NEGATIVE 11/27/2015 1230   Sepsis Labs: @LABRCNTIP (procalcitonin:4,lacticidven:4) )No results found for this or any previous visit (from the past 240 hour(s)).       Radiology Studies: No results found.    Scheduled Meds: . amLODipine  10 mg Oral Daily  . busPIRone  5 mg Oral TID  . enoxaparin (LOVENOX) injection  40 mg Subcutaneous QHS  . ferric gluconate (FERRLECIT/NULECIT) IV  250 mg Intravenous Once  . FLUoxetine  40 mg Oral Daily  . insulin aspart  0-15 Units Subcutaneous TID WC  . insulin aspart  0-5 Units Subcutaneous QHS  . insulin  detemir  28 Units Subcutaneous Daily  . loratadine  10 mg Oral Daily  . metoCLOPramide (REGLAN) injection  10 mg Intravenous Q6H  . pantoprazole  40 mg Oral Daily   Continuous Infusions: . sodium chloride 75 mL/hr at 11/29/15 0027     LOS: 2 days    Time spent in minutes: 15    [place], MD [facility] Pager: [email or web address] Password [facility] 11/29/2015, 11:33 AM

## 2015-11-29 NOTE — Progress Notes (Signed)
Patient transferred from ICU. Alert and oriented x 3, flat affect. Vital signs obtained by NT. Patient in bed, comfortable. Will continue to monitor.

## 2015-11-30 DIAGNOSIS — E1040 Type 1 diabetes mellitus with diabetic neuropathy, unspecified: Secondary | ICD-10-CM | POA: Diagnosis not present

## 2015-11-30 DIAGNOSIS — E1010 Type 1 diabetes mellitus with ketoacidosis without coma: Secondary | ICD-10-CM

## 2015-11-30 DIAGNOSIS — D509 Iron deficiency anemia, unspecified: Secondary | ICD-10-CM | POA: Diagnosis not present

## 2015-11-30 DIAGNOSIS — E538 Deficiency of other specified B group vitamins: Secondary | ICD-10-CM | POA: Diagnosis not present

## 2015-11-30 DIAGNOSIS — K3184 Gastroparesis: Secondary | ICD-10-CM

## 2015-11-30 DIAGNOSIS — E1143 Type 2 diabetes mellitus with diabetic autonomic (poly)neuropathy: Secondary | ICD-10-CM | POA: Diagnosis not present

## 2015-11-30 DIAGNOSIS — K219 Gastro-esophageal reflux disease without esophagitis: Secondary | ICD-10-CM | POA: Diagnosis not present

## 2015-11-30 LAB — GLUCOSE, CAPILLARY
GLUCOSE-CAPILLARY: 168 mg/dL — AB (ref 65–99)
GLUCOSE-CAPILLARY: 77 mg/dL (ref 65–99)
GLUCOSE-CAPILLARY: 109 mg/dL — AB (ref 65–99)
Glucose-Capillary: 144 mg/dL — ABNORMAL HIGH (ref 65–99)
Glucose-Capillary: 133 mg/dL — ABNORMAL HIGH (ref 65–99)

## 2015-11-30 LAB — BASIC METABOLIC PANEL
ANION GAP: 8 (ref 5–15)
BUN: 9 mg/dL (ref 6–20)
CALCIUM: 8.5 mg/dL — AB (ref 8.9–10.3)
CO2: 17 mmol/L — ABNORMAL LOW (ref 22–32)
Chloride: 111 mmol/L (ref 101–111)
Creatinine, Ser: 0.82 mg/dL (ref 0.44–1.00)
Glucose, Bld: 141 mg/dL — ABNORMAL HIGH (ref 65–99)
POTASSIUM: 4.2 mmol/L (ref 3.5–5.1)
SODIUM: 136 mmol/L (ref 135–145)

## 2015-11-30 LAB — MAGNESIUM: MAGNESIUM: 1.9 mg/dL (ref 1.7–2.4)

## 2015-11-30 MED ORDER — ATENOLOL 25 MG PO TABS
12.5000 mg | ORAL_TABLET | Freq: Every day | ORAL | Status: DC
  Administered 2015-11-30: 12.5 mg via ORAL
  Filled 2015-11-30: qty 1

## 2015-11-30 MED ORDER — ATENOLOL 25 MG PO TABS
25.0000 mg | ORAL_TABLET | Freq: Every day | ORAL | Status: DC
  Administered 2015-12-01: 25 mg via ORAL
  Filled 2015-11-30: qty 1

## 2015-11-30 MED ORDER — PROPRANOLOL HCL 10 MG PO TABS: 10.0000 mg | ORAL_TABLET | Freq: Two times a day (BID) | ORAL | Status: DC | PRN

## 2015-11-30 MED ORDER — BUSPIRONE HCL 5 MG PO TABS
5.0000 mg | ORAL_TABLET | Freq: Once | ORAL | Status: DC
  Filled 2015-11-30: qty 1

## 2015-11-30 MED ORDER — AMLODIPINE BESYLATE 5 MG PO TABS
5.0000 mg | ORAL_TABLET | Freq: Every day | ORAL | Status: DC
  Administered 2015-12-01: 5 mg via ORAL
  Filled 2015-11-30: qty 1

## 2015-11-30 MED ORDER — PREGABALIN 75 MG PO CAPS
150.0000 mg | ORAL_CAPSULE | Freq: Two times a day (BID) | ORAL | Status: DC
  Administered 2015-11-30 – 2015-12-01 (×3): 150 mg via ORAL
  Filled 2015-11-30 (×3): qty 2

## 2015-11-30 MED ORDER — BUSPIRONE HCL 10 MG PO TABS
10.0000 mg | ORAL_TABLET | Freq: Three times a day (TID) | ORAL | Status: DC
  Administered 2015-11-30 – 2015-12-01 (×3): 10 mg via ORAL
  Filled 2015-11-30 (×5): qty 1

## 2015-11-30 NOTE — Progress Notes (Signed)
[facility] Progress Note  Patient: [name] [id number]   PCP: [name], MD DOB: September 05, 1965   DOA: 11/27/2015   DOS: 11/30/2015   Date of Service: the patient was seen and examined on 11/30/2015  Subjective: The patient mentions that she has been having panic attacks. She denies any chest pain or shortness of breath. No nausea no vomiting. Oral intake is minimal Nutrition: Tolerating oral diet with minimal oral intake  Brief hospital course: Patient was admitted on 11/27/2015, with complaint of nausea and vomiting as well as abdominal pain consistent with prior gastroparesis, was found to have gastroparesis with DKA. Currently further plan is continue advancement of the diet.  Assessment and Plan: 1. Diabetic ketoacidosis without coma associated with type 1 diabetes mellitus [facility]) Patient presented with DKA.  Anion gap is closed. Patient is on sliding scale insulin with 28 units of Levemir. We will continue to closely monitor.  2. Diabetic gastroparesis. Nausea and vomiting as well as abdominal pain secondary to gastroparesis. We'll advance the diet to small frequent meals. Continue home Reglan.  3. Iron deficiency anemia.  patient received IV iron infusion.  4. B-12 deficiency.  patient is receiving B-12 injection here in the hospital.  5. Essential hypertension. Starting the patient on low-dose atenolol. Patient was also started on amlodipine 10 mg which I would like to change due to its effect on GI tract.  6. Hypokalemia. Improving. We will need to monitor.  7. Anxiety. Complaint of recurrent panic attacks. We will increase BuSpar from 5 mg 3 times a day to 10 mg 3 times a day. Continue Prozac 40 mg daily. Maximum dose Continue atenolol Resume Lyrica home dose.  Pain management: When necessary Tylenol, would like to avoid narcotics in the setting of gastroparesis. Can use Toradol Activity: Consulted physical therapy Bowel regimen: last BM  11/28/2015 Diet: Carb modified diet DVT Prophylaxis: subcutaneous Heparin  Advance goals of care discussion: Full code  Family Communication: family was present at bedside, at the time of interview. The pt provided permission to discuss medical plan with the family. Opportunity was given to ask question and all questions were answered satisfactorily.   Disposition:  Discharge to home, likely with home health due to recurrent admission Expected discharge date: 12/01/2015 pending tolerating oral diet  Consultants: None Procedures: None  Antibiotics: Anti-infectives    None        Intake/Output Summary (Last 24 hours) at 11/30/15 1733 Last data filed at 11/30/15 0830  Gross per 24 hour  Intake      0 ml  Output   1510 ml  Net  -1510 ml   Filed Weights   11/27/15 2200 11/29/15 1050  Weight: 61 kg (134 lb 7.7 oz) 62.6 kg (138 lb 0.1 oz)    Objective: Physical Exam: Filed Vitals:   11/30/15 0450 11/30/15 1044 11/30/15 1430 11/30/15 1440  BP: 166/72 170/80 170/81 174/74  Pulse: 116 105  116  Temp: 97.7 F (36.5 C)   98.8 F (37.1 C)  TempSrc: Oral   Oral  Resp:    20  Height:      Weight:      SpO2: 100%   100%    General: Alert, Awake and Oriented to Time, Place and Person. Appear in mild distress Eyes: PERRL, Conjunctiva normal ENT: Oral Mucosa clear moist. Neck: no JVD, no Abnormal Mass Or lumps Cardiovascular: S1 and S2 Present, no Murmur, Peripheral Pulses Present Respiratory: Bilateral Air entry equal and Decreased, Clear to Auscultation, no  Crackles, no wheezes Abdomen: Bowel Sound present, Soft and diffuse tenderness Skin: no redness, no Rash  Extremities: no Pedal edema, no calf tenderness Neurologic: Grossly no focal neuro deficit. Bilaterally Equal motor strength  Data Reviewed: CBC:  Recent Labs Lab 11/27/15 1300 11/29/15 0340  WBC 8.7 10.2  HGB 9.5* 8.9*  HCT 30.6* 28.9*  MCV 70.3* 72.1*  PLT 445* 123456*   Basic Metabolic Panel:  Recent  Labs Lab 11/27/15 2305  11/28/15 0730 11/28/15 1105 11/28/15 1548 11/29/15 0340 11/30/15 0401  NA 139  < > 138 134* 137 136 136  K 4.1  < > 4.2 4.2 3.7 3.4* 4.2  CL 113*  < > 111 112* 113* 108 111  CO2 11*  < > 16* 12* 15* 18* 17*  GLUCOSE 243*  < > 120* 277* 153* 151* 141*  BUN 14  < > 17 15 13 8 9   CREATININE 1.09*  < > 1.33* 1.19* 1.03* 0.85 0.82  CALCIUM 9.2  < > 8.8* 8.7* 8.5* 8.4* 8.5*  MG 2.0  --   --   --   --   --  1.9  PHOS 2.6  --   --   --   --   --   --   < > = values in this interval not displayed.  Liver Function Tests:  Recent Labs Lab 11/27/15 1300  AST 23  ALT 19  ALKPHOS 105  BILITOT 1.3*  PROT 8.4*  ALBUMIN 4.3    Recent Labs Lab 11/27/15 1300  LIPASE 22   No results for input(s): AMMONIA in the last 168 hours. Coagulation Profile: No results for input(s): INR, PROTIME in the last 168 hours. Cardiac Enzymes:  Recent Labs Lab 11/27/15 2039 11/28/15 0232 11/28/15 0730  TROPONINI <0.03 <0.03 <0.03   BNP (last 3 results) No results for input(s): PROBNP in the last 8760 hours.  CBG:  Recent Labs Lab 11/29/15 1710 11/29/15 2123 11/30/15 0427 11/30/15 0811 11/30/15 1237  GLUCAP 115* 124* 144* 168* 133*    Studies: No results found.   Scheduled Meds: . amLODipine  10 mg Oral Daily  . atenolol  12.5 mg Oral Daily  . busPIRone  10 mg Oral TID  . busPIRone  5 mg Oral Once  . cyanocobalamin  1,000 mcg Subcutaneous Daily  . enoxaparin (LOVENOX) injection  40 mg Subcutaneous QHS  . FLUoxetine  40 mg Oral Daily  . insulin aspart  0-15 Units Subcutaneous TID WC  . insulin aspart  0-5 Units Subcutaneous QHS  . insulin detemir  28 Units Subcutaneous Daily  . loratadine  10 mg Oral Daily  . metoCLOPramide (REGLAN) injection  10 mg Intravenous Q6H  . pantoprazole  40 mg Oral Daily  . pregabalin  150 mg Oral BID   Continuous Infusions:  PRN Meds: acetaminophen, albuterol, ondansetron (ZOFRAN) IV  Time spent: 30  minutes  Author: Berle Mull, MD [facility] Pager: [phone] 11/30/2015 5:33 PM  If 7PM-7AM, please contact night-coverage at [email or web address], password [facility]

## 2015-12-01 DIAGNOSIS — I4581 Long QT syndrome: Secondary | ICD-10-CM

## 2015-12-01 DIAGNOSIS — E1010 Type 1 diabetes mellitus with ketoacidosis without coma: Secondary | ICD-10-CM | POA: Diagnosis not present

## 2015-12-01 DIAGNOSIS — K219 Gastro-esophageal reflux disease without esophagitis: Secondary | ICD-10-CM

## 2015-12-01 DIAGNOSIS — D509 Iron deficiency anemia, unspecified: Secondary | ICD-10-CM

## 2015-12-01 DIAGNOSIS — E1143 Type 2 diabetes mellitus with diabetic autonomic (poly)neuropathy: Secondary | ICD-10-CM | POA: Diagnosis not present

## 2015-12-01 LAB — BASIC METABOLIC PANEL
ANION GAP: 5 (ref 5–15)
BUN: 13 mg/dL (ref 6–20)
CO2: 20 mmol/L — ABNORMAL LOW (ref 22–32)
Calcium: 8.1 mg/dL — ABNORMAL LOW (ref 8.9–10.3)
Chloride: 108 mmol/L (ref 101–111)
Creatinine, Ser: 1.15 mg/dL — ABNORMAL HIGH (ref 0.44–1.00)
GFR calc Af Amer: >60 mL/min (ref >60)
GFR, EST NON AFRICAN AMERICAN: 55 mL/min — AB (ref >60)
Glucose, Bld: 95 mg/dL (ref 65–99)
POTASSIUM: 3.8 mmol/L (ref 3.5–5.1)
SODIUM: 133 mmol/L — AB (ref 135–145)

## 2015-12-01 LAB — CBC
HEMATOCRIT: 27.9 % — AB (ref 36.0–46.0)
HEMOGLOBIN: 8.5 g/dL — AB (ref 12.0–15.0)
MCH: 22 pg — ABNORMAL LOW (ref 26.0–34.0)
MCHC: 30.5 g/dL (ref 30.0–36.0)
MCV: 72.3 fL — ABNORMAL LOW (ref 78.0–100.0)
Platelets: 371 10*3/uL (ref 150–400)
RBC: 3.86 MIL/uL — AB (ref 3.87–5.11)
RDW: 18.3 % — AB (ref 11.5–15.5)
WBC: 6.6 10*3/uL (ref 4.0–10.5)

## 2015-12-01 LAB — GLUCOSE, CAPILLARY
GLUCOSE-CAPILLARY: 101 mg/dL — AB (ref 65–99)
Glucose-Capillary: 52 mg/dL — ABNORMAL LOW (ref 65–99)
Glucose-Capillary: 193 mg/dL — ABNORMAL HIGH (ref 65–99)

## 2015-12-01 LAB — MAGNESIUM: Magnesium: 1.9 mg/dL (ref 1.7–2.4)

## 2015-12-01 MED ORDER — VITAMIN B-12 1000 MCG PO TABS: 1000.0000 ug | ORAL_TABLET | Freq: Every day | ORAL | Status: DC

## 2015-12-01 MED ORDER — ATENOLOL 25 MG PO TABS: 25.0000 mg | ORAL_TABLET | Freq: Every day | ORAL | Status: DC

## 2015-12-01 MED ORDER — METOCLOPRAMIDE HCL 10 MG PO TABS
10.0000 mg | ORAL_TABLET | Freq: Four times a day (QID) | ORAL | Status: DC
  Administered 2015-12-01 (×3): 10 mg via ORAL
  Filled 2015-12-01 (×3): qty 1

## 2015-12-01 MED ORDER — BUSPIRONE HCL 5 MG PO TABS: 10.0000 mg | ORAL_TABLET | Freq: Three times a day (TID) | ORAL | Status: DC

## 2015-12-01 MED ORDER — AMLODIPINE BESYLATE 2.5 MG PO TABS: 2.5000 mg | ORAL_TABLET | Freq: Every day | ORAL | Status: DC

## 2015-12-01 MED ORDER — METOCLOPRAMIDE HCL 10 MG PO TABS: 10.0000 mg | ORAL_TABLET | Freq: Three times a day (TID) | ORAL | Status: DC

## 2015-12-01 NOTE — Care Management Note (Signed)
Case Management Note  Patient Details  Name: Donecia Daker MRN: [id number] Date of Birth: Sep 12, 1965  Subjective/Objective:   50 yo admitted with DKA.                 Action/Plan: From home with son. Pt is active with transitional care clinic at [facility] and has follow up appointment scheduled. Pt has had multiple hospital admissions recently.  Pt to dc home with [facility] to monitor pt diabetes and gastroparesis at home.  Pt offered choice for [facility] services and chose [facility].  [facility] rep contacted for referral.  No other CM needs communicated.  Expected Discharge Date:   (UNKNOWN)               Expected Discharge Plan:  [place]  In-House Referral:  NA  Discharge planning Services  CM Consult  Post Acute Care Choice:  NA Choice offered to:  Patient  DME Arranged:    DME Agency:  NA  HH Arranged:  RN [place] Agency:  [place]  Status of Service:  Completed, signed off  Medicare Important Message Given:    Date Medicare IM Given:    Medicare IM give by:    Date Additional Medicare IM Given:    Additional Medicare Important Message give by:     If discussed at [place] of Stay Meetings, dates discussed:    Additional CommentsLynnell Catalan, RN 12/01/2015, 10:46 AM  [phone]

## 2015-12-01 NOTE — Progress Notes (Signed)
Pt discharged home in stable condition. Discharge instructions given. Scripts sent to pharmacy of choice.  Pt voices no questions or concerns at this time.

## 2015-12-01 NOTE — Progress Notes (Signed)
PT Cancellation Note  Patient Details Name: [name] MRN: [id number] DOB: [birth year]   Cancelled Treatment:     PT order received but eval deferred.  RN reports pt ambulating full circuit of hall accompanied by CNA but unassisted.  No PT needs identified.  Will defer services at this time.   Kinda Pottle 12/01/2015, 11:41 AM

## 2015-12-01 NOTE — Discharge Summary (Signed)
[facility] Discharge Summary   Patient: [name] [id number]   PCP: [name], MD DOB: 31-Dec-1965   Date of admission: 11/27/2015   Date of discharge:  12/01/2015    Discharge Diagnoses:  Principal Problem:   Diabetic ketoacidosis without coma associated with type 1 diabetes mellitus ([place]) Active Problems:   Diabetic neuropathy, type I diabetes mellitus ([place])   Diabetic gastroparesis ([place])   Vitamin B12 deficiency   Anemia, iron deficiency   HLD (hyperlipidemia)   GERD (gastroesophageal reflux disease)   Prolonged QT interval   Abnormal ECG  Admitted From: Home Disposition:  Home with family  Recommendations for Outpatient Follow-up:  1. Please follow-up with PCP in one week, please remain compliant with medical regimen  2. Home Health: RN   Follow-up Information    Follow up with [facility] on 12/09/2015.   Specialty:  Internal Medicine   Why:  at 11:30 am for a hospital follow up appointment   Contact information:   [id number] [place] [phone]     Diet recommendation: Small frequent meals 6 times a day  Activity: The patient is advised to gradually reintroduce usual activities.  Discharge Condition: good CODE STATUS: Full code  History of present illness: As per the H and P dictated on admission, "[name] is a 50 y.o. female with medical history significant of DM 1, gastroparesis, neuropathy, GERD Chronic non-AG acidosis at baseline number hyperlipidemia, depression  Presented with few hours symptoms of nausea vomiting and abdominal pain similar to prior episodes of gastroparesis this started since 5 AM this morning since then she has vomited at least 10 times. Vomitus is nonbloody nonbilious not associated with any diarrhea fever. She have been having elevated blood sugars for some time. Patient has recurrent admissions for similar presentation for DKA.  Reports significant abdominal pain requests something IV. Patietn is somnolent. Answers questions in short words. "  Hospital Course:  Summary of her active problems in the hospital is as following. 1. Diabetic ketoacidosis without coma associated with type 1 diabetes mellitus [facility]) Patient presented with DKA.  Anion gap is closed. Patient is on sliding scale insulin with 28 units of Levemir. Given that the patient has a few episodes of hypoglycemia here in the hospital revealed resume home Levemir 25 units on discharge. Continue home regimen  2. Diabetic gastroparesis. Nausea and vomiting as well as abdominal pain secondary to gastroparesis. Continue home Reglan on scheduled basis. Small frequent meals recommended to patient  3. Iron deficiency anemia.  patient received IV iron infusion. Continue supplementation on discharge  4. B-12 deficiency.  patient is receiving B-12 injection here in the hospital. Transition to oral B-12 discharge  5. Essential hypertension. Starting the patient on low-dose atenolol. Patient was also started on amlodipine 10 mg, I would reduce the dose to 2.5 mg amlodipine due to effect of constipation from high-dose.  6. Hypokalemia. Improving. We will need to monitor.  7. Anxiety. Complaint of recurrent panic attacks. We will increase BuSpar from 5 mg 3 times a day to 10 mg 3 times a day. Continue Prozac 40 mg daily. Maximum dose Continue atenolol Resume Lyrica home dose.  All other chronic medical condition were stable during the hospitalization.  Patient was ambulatory without any assistance. Home health RN was arranged to monitor the patient's progress due to recurrent admissions in the hospital On the day of the discharge the patient's vitals were stable, and patient was  able to tolerate oral diet, no other acute medical condition were reported by patient. the patient was felt safe to be discharge at home with home health RN and  family.  Procedures and Results:  None   Consultations:  None  DISCHARGE MEDICATION: Current Discharge Medication List    START taking these medications   Details  amLODipine (NORVASC) 2.5 MG tablet Take 1 tablet (2.5 mg total) by mouth daily. Qty: 30 tablet, Refills: 0    atenolol (TENORMIN) 25 MG tablet Take 1 tablet (25 mg total) by mouth daily. Qty: 30 tablet, Refills: 0    vitamin B-12 (CYANOCOBALAMIN) 1000 MCG tablet Take 1 tablet (1,000 mcg total) by mouth daily. Qty: 30 tablet, Refills: 0      CONTINUE these medications which have CHANGED   Details  busPIRone (BUSPAR) 5 MG tablet Take 2 tablets (10 mg total) by mouth 3 (three) times daily. Qty: 30 tablet, Refills: 0   Associated Diagnoses: Anxiety and depression    metoCLOPramide (REGLAN) 10 MG tablet Take 1 tablet (10 mg total) by mouth 4 (four) times daily -  before meals and at bedtime. Qty: 30 tablet, Refills: 0      CONTINUE these medications which have NOT CHANGED   Details  albuterol (PROVENTIL HFA;VENTOLIN HFA) 108 (90 Base) MCG/ACT inhaler Inhale 2 puffs into the lungs every 6 (six) hours as needed for wheezing or shortness of breath. Qty: 1 Inhaler, Refills: 0   Associated Diagnoses: Seasonal allergies    atorvastatin (LIPITOR) 20 MG tablet Take 1 tablet (20 mg total) by mouth daily at 6 PM. Qty: 30 tablet, Refills: 0    Blood Glucose Monitoring Suppl (TRUE METRIX METER) DEVI 1 each by Does not apply route 3 (three) times daily before meals. Qty: 1 Device, Refills: 0   Associated Diagnoses: Type I diabetes mellitus with complication, uncontrolled (HCC)    cetirizine (ZYRTEC) 10 MG tablet Take 1 tablet (10 mg total) by mouth daily. Qty: 30 tablet, Refills: 11   Associated Diagnoses: Seasonal allergies    docusate sodium (COLACE) 100 MG capsule Take 1 capsule (100 mg total) by mouth 2 (two) times daily. Qty: 60 capsule, Refills: 0    Ferrous Fumarate (HEMOCYTE - 106 MG FE) 324 (106 Fe) MG TABS  tablet Take 1 tablet (106 mg of iron total) by mouth 2 (two) times daily. Qty: 60 tablet, Refills: 0    FLUoxetine (PROZAC) 40 MG capsule Take 40 mg by mouth daily.    glucose blood (TRUE METRIX BLOOD GLUCOSE TEST) test strip Use 3 times daily before meals Qty: 100 each, Refills: 5   Associated Diagnoses: Type I diabetes mellitus with complication, uncontrolled (HCC)    insulin aspart (NOVOLOG) 100 UNIT/ML injection Inject 0-15 Units into the skin 3 (three) times daily with meals. Per sliding scale Qty: 50 mL, Refills: 3   Associated Diagnoses: Diabetic ketoacidosis without coma associated with type 1 diabetes mellitus (HCC)    insulin detemir (LEVEMIR) 100 UNIT/ML injection Inject 0.25 mLs (25 Units total) into the skin daily. Qty: 30 mL, Refills: 3   Associated Diagnoses: Diabetic ketoacidosis without coma associated with type 1 diabetes mellitus (HCC)    Insulin Syringe-Needle U-100 (BD INSULIN SYRINGE ULTRAFINE) 31G X 15/64" 0.5 ML MISC 1 each by Does not apply route 4 (four) times daily as needed. Qty: 120 each, Refills: 5   Associated Diagnoses: Type 1 diabetes mellitus with hyperglycemia (HCC)    ondansetron (ZOFRAN ODT) 4 MG disintegrating tablet Take 1 tablet (  4 mg total) by mouth every 8 (eight) hours as needed for nausea. Qty: 40 tablet, Refills: 0   Associated Diagnoses: Diabetic gastroparesis ([place])    pantoprazole (PROTONIX) 40 MG tablet Take 40 mg by mouth daily. Reported on 07/19/2015    polyethylene glycol powder (GLYCOLAX/MIRALAX) powder Take 17 g by mouth daily as needed for moderate constipation. Reported on 07/19/2015    pregabalin (LYRICA) 150 MG capsule Take 1 capsule (150 mg total) by mouth 2 (two) times daily. Qty: 180 capsule, Refills: 3   Associated Diagnoses: Diabetic neuropathy, type I diabetes mellitus ([place])    TRUEPLUS LANCETS 28G MISC 1 each by Does not apply route 3 (three) times daily before meals. Qty: 100 each, Refills: 5   Associated Diagnoses:  Type I diabetes mellitus with complication, uncontrolled ([place])    feeding supplement, GLUCERNA SHAKE, (GLUCERNA SHAKE) LIQD Take 237 mLs by mouth 3 (three) times daily between meals.      STOP taking these medications     lisinopril (PRINIVIL,ZESTRIL) 5 MG tablet      promethazine (PHENERGAN) 25 MG suppository        Allergies  Allergen Reactions  . Anesthetics, Amide Nausea And Vomiting  . Penicillins Diarrhea and Nausea And Vomiting    Has patient had a PCN reaction causing immediate rash, facial/tongue/throat swelling, SOB or lightheadedness with hypotension: No Has patient had a PCN reaction causing severe rash involving mucus membranes or skin necrosis: No Has patient had a PCN reaction that required hospitalization No Has patient had a PCN reaction occurring within the last 10 years: Yes  If all of the above answers are "NO", then may proceed with Cephalosporin use.   . Buprenorphine Hcl Rash  . Encainide Nausea And Vomiting   Discharge Instructions    Diet - low sodium heart healthy    Complete by:  As directed   Small frequent meals, 6 meals a day, smaller portions.     Diet Carb Modified    Complete by:  As directed   Small frequent meals, 6 meals a day, smaller portions.     Discharge instructions    Complete by:  As directed   It is important that you read following instructions as well as go over your medication list with RN to help you understand your care after this hospitalization.  Discharge Instructions:  Please follow-up with PCP in one week  Please request your primary care physician to go over all Hospital Tests and Procedure/Radiological results at the follow up,  Please get all Hospital records sent to your PCP by signing hospital release before you go home.   Do not drive, operating heavy machinery, perform activities at heights, swimming or participation in water activities or provide baby sitting services while your are on new Anxiety Medications;  until you have been seen by Primary Care Physician or a Neurologist and advised to do so again. Do not take more than prescribed Pain, Sleep and Anxiety Medications. You were cared for by a hospitalist during your hospital stay. If you have any questions about your discharge medications or the care you received while you were in the hospital after you are discharged, you can call the unit and ask to speak with the hospitalist on call if the hospitalist that took care of you is not available.  Once you are discharged, your primary care physician will handle any further medical issues. Please note that NO REFILLS for any discharge medications will be authorized once you are discharged,  as it is imperative that you return to your primary care physician (or establish a relationship with a primary care physician if you do not have one) for your aftercare needs so that they can reassess your need for medications and monitor your lab values. You Must read complete instructions/literature along with all the possible adverse reactions/side effects for all the Medicines you take and that have been prescribed to you. Take any new Medicines after you have completely understood and accept all the possible adverse reactions/side effects. Wear Seat belts while driving. If you have smoked or chewed Tobacco in the last 2 yrs please stop smoking and/or stop any Recreational drug use.     Increase activity slowly    Complete by:  As directed           Discharge Exam: Filed Weights   11/27/15 2200 11/29/15 1050  Weight: 61 kg (134 lb 7.7 oz) 62.6 kg (138 lb 0.1 oz)   Filed Vitals:   11/30/15 2130 12/01/15 0604  BP: 148/80 105/57  Pulse: 77 83  Temp: 98.9 F (37.2 C) 98.2 F (36.8 C)  Resp: 20 16   General: Appear in no distress, no Rash; Oral Mucosa moist. Cardiovascular: S1 and S2 Present, no Murmur, no JVD Respiratory: Bilateral Air entry present and Clear to Auscultation, no Crackles, no  wheezes Abdomen: Bowel Sound present, Soft and no tenderness Extremities: no Pedal edema, no calf tenderness Neurology: Grossly no focal neuro deficit.  The results of significant diagnostics from this hospitalization (including imaging, microbiology, ancillary and laboratory) are listed below for reference.    Significant Diagnostic Studies: Dg Abd Portable 2v  11/15/2015  CLINICAL DATA:  Vomiting.  Abdominal pain. EXAM: PORTABLE ABDOMEN - 2 VIEW COMPARISON:  07/29/2015.  04/16/2015. FINDINGS: Right femoral line noted with its tip projected at the level of L4-L5. Surgical clips right upper quadrant. No significant bowel distention. Several nonspecific nondilated air-filled loops of small bowel noted. Moderate stool volume. No free air. Lumbar spine scoliosis concave left. No acute bony abnormality. IMPRESSION: 1.  Right femoral line noted with its tip at the L4-L5 level. 2.  No acute abnormality identified. Electronically Signed   By: Marcello Moores  Register   On: 11/15/2015 07:28    Microbiology: No results found for this or any previous visit (from the past 240 hour(s)).   Labs: CBC:  Recent Labs Lab 11/27/15 1300 11/29/15 0340 12/01/15 0352  WBC 8.7 10.2 6.6  HGB 9.5* 8.9* 8.5*  HCT 30.6* 28.9* 27.9*  MCV 70.3* 72.1* 72.3*  PLT 445* 412* [id number]   Basic Metabolic Panel:  Recent Labs Lab 11/27/15 2305  11/28/15 1105 11/28/15 1548 11/29/15 0340 11/30/15 0401 12/01/15 0352  NA 139  < > 134* 137 136 136 133*  K 4.1  < > 4.2 3.7 3.4* 4.2 3.8  CL 113*  < > 112* 113* 108 111 108  CO2 11*  < > 12* 15* 18* 17* 20*  GLUCOSE 243*  < > 277* 153* 151* 141* 95  BUN 14  < > 15 13 8 9 13   CREATININE 1.09*  < > 1.19* 1.03* 0.85 0.82 1.15*  CALCIUM 9.2  < > 8.7* 8.5* 8.4* 8.5* 8.1*  MG 2.0  --   --   --   --  1.9 1.9  PHOS 2.6  --   --   --   --   --   --   < > = values in this interval not displayed. Liver Function Tests:  Recent Labs Lab 11/27/15 1300  AST 23  ALT 19  ALKPHOS 105   BILITOT 1.3*  PROT 8.4*  ALBUMIN 4.3    Recent Labs Lab 11/27/15 1300  LIPASE 22   No results for input(s): AMMONIA in the last 168 hours. Cardiac Enzymes:  Recent Labs Lab 11/27/15 2039 11/28/15 0232 11/28/15 0730  TROPONINI <0.03 <0.03 <0.03   BNP (last 3 results) No results for input(s): BNP in the last 8760 hours. CBG:  Recent Labs Lab 11/30/15 1800 11/30/15 2130 12/01/15 0825 12/01/15 0916 12/01/15 1148  GLUCAP 77 109* 52* 101* 193*   Time spent: 30 minutes  Signed:  Coraima Tibbs  [facility]  12/01/2015  , 12:45 PM

## 2015-12-01 NOTE — Discharge Instructions (Signed)
Gastroparesis °Gastroparesis, also called delayed gastric emptying, is a condition in which food takes longer than normal to empty from the stomach. The condition is usually long-lasting (chronic). °CAUSES °This condition may be caused by: °· An endocrine disorder, such as hypothyroidism or diabetes. Diabetes is the most common cause of this condition. °· A nervous system disease, such as Parkinson disease or multiple sclerosis. °· Cancer, infection, or surgery of the stomach or vagus nerve. °· A connective tissue disorder, such as scleroderma. °· Certain medicines. °In most cases, the cause is not known. °RISK FACTORS °This condition is more likely to develop in: °· People with certain disorders, including endocrine disorders, eating disorders, amyloidosis, and scleroderma. °· People with certain diseases, including Parkinson disease or multiple sclerosis. °· People with cancer or infection of the stomach or vagus nerve. °· People who have had surgery on the stomach or vagus nerve. °· People who take certain medicines. °· Women. °SYMPTOMS °Symptoms of this condition include: °· An early feeling of fullness when eating. °· Nausea. °· Weight loss. °· Vomiting. °· Heartburn. °· Abdominal bloating. °· Inconsistent blood glucose levels. °· Lack of appetite. °· Acid from the stomach coming up into the esophagus (gastroesophageal reflux). °· Spasms of the stomach. °Symptoms may come and go. °DIAGNOSIS °This condition is diagnosed with tests, such as: °· Tests that check how long it takes food to move through the stomach and intestines. These tests include: °¨ Upper gastrointestinal (GI) series. In this test, X-rays of the intestines are taken after you drink a liquid. The liquid makes the intestines show up better on the X-rays. °¨ Gastric emptying scintigraphy. In this test, scans are taken after you eat food that contains a small amount of radioactive material. °¨ Wireless capsule GI monitoring system. This test  involves swallowing a capsule that records information about movement through the stomach. °· Gastric manometry. This test measures electrical and muscular activity in the stomach. It is done with a thin tube that is passed down the throat and into the stomach. °· Endoscopy. This test checks for abnormalities in the lining of the stomach. It is done with a long, thin tube that is passed down the throat and into the stomach. °· An ultrasound. This test can help rule out gallbladder disease or pancreatitis as a cause of your symptoms. It uses sound waves to take pictures of the inside of your body. °TREATMENT °There is no cure for gastroparesis. This condition may be managed with: °· Treatment of the underlying condition causing the gastroparesis. °· Lifestyle changes, including exercise and dietary changes. Dietary changes can include: °¨ Changes in what and when you eat. °¨ Eating smaller meals more often. °¨ Eating low-fat foods. °¨ Eating low-fiber forms of high-fiber foods, such as cooked vegetables instead of raw vegetables. °¨ Having liquid foods in place of solid foods. Liquid foods are easier to digest. °· Medicines. These may be given to control nausea and vomiting and to stimulate stomach muscles. °· Getting food through a feeding tube. This may be done in severe cases. °· A gastric neurostimulator. This is a device that is inserted into the body with surgery. It helps improve stomach emptying and control nausea and vomiting. °HOME CARE INSTRUCTIONS °· Follow your health care provider's instructions about exercise and diet. °· Take medicines only as directed by your health care provider. °SEEK MEDICAL CARE IF: °· Your symptoms do not improve with treatment. °· You have new symptoms. °SEEK IMMEDIATE MEDICAL CARE IF: °· You have   severe abdominal pain that does not improve with treatment. °· You have nausea that does not go away. °· You cannot keep fluids down. °  °This information is not intended to replace  advice given to you by your health care provider. Make sure you discuss any questions you have with your health care provider. °  °Document Released: 06/11/2005 Document Revised: 10/26/2014 Document Reviewed: 06/07/2014 °[employer] Interactive Patient Education ©2016 [employer] ° °

## 2015-12-09 ENCOUNTER — Inpatient Hospital Stay: Payer: 59 | Admitting: Family Medicine

## 2015-12-25 IMAGING — CT CT ABD-PELV W/O CM
1 of 2 series · 14 of 32 positions shown, 18 images · non-contrast
Comparison: 11/15/2014

CLINICAL DATA: Diabetes. Constipation. Gastro paresis. Currently
presents with intractable nausea and vomiting for several days with
associated abdominal cramps.

EXAM:
CT ABDOMEN AND PELVIS WITHOUT CONTRAST
TECHNIQUE: Multidetector CT imaging of the abdomen and pelvis was performed
following the standard protocol without IV contrast.

[Series 2: rtn ap without · axial · non-contrast · 0.65mm/px · z∈[-516,-116]mm · 14 of 88 slices shown, 18 images]
[im 4/88  soft-tissue]
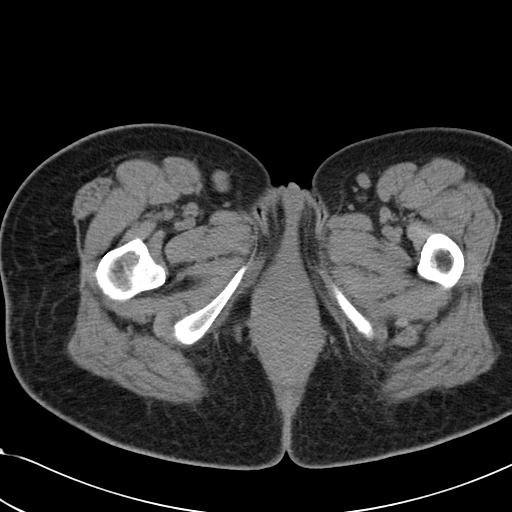
[im 4/88  bone]
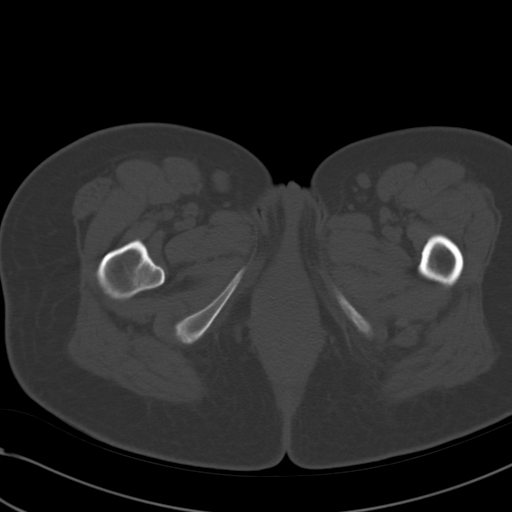
[im 12/88  soft-tissue]
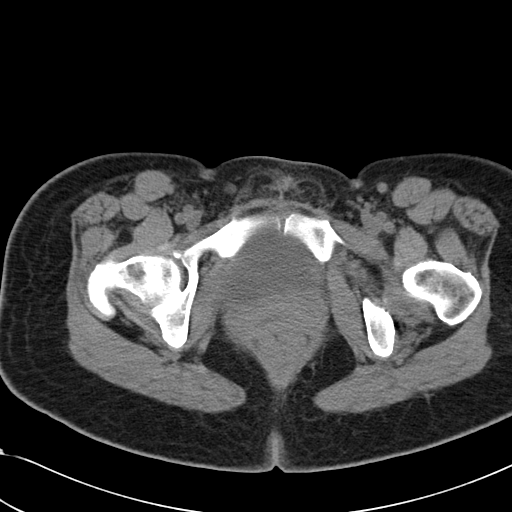
[im 20/88  soft-tissue]
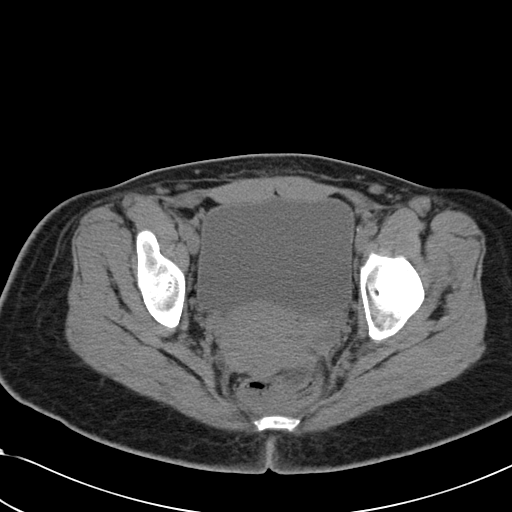
[im 28/88  soft-tissue]
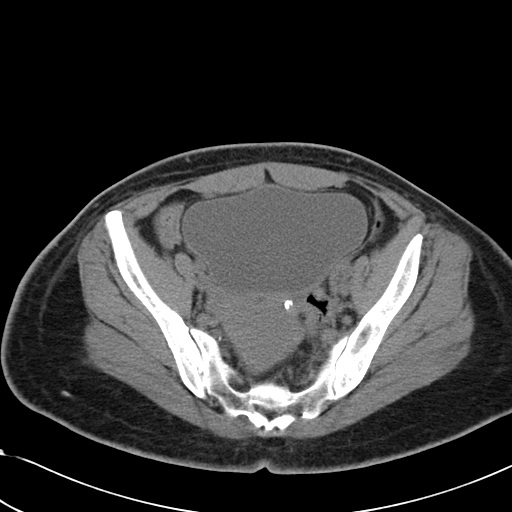
[im 32/88  soft-tissue]
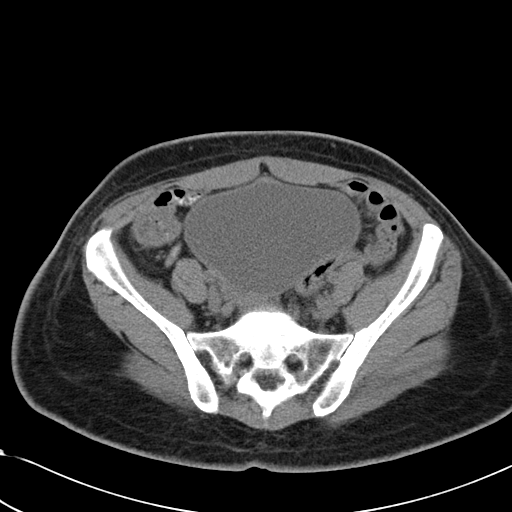
[im 40/88  soft-tissue]
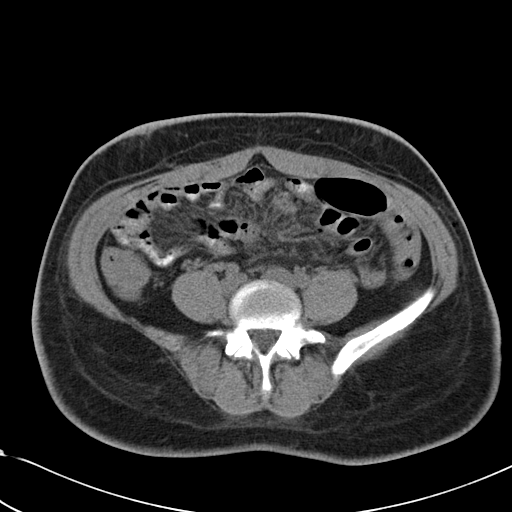
[im 48/88  soft-tissue]
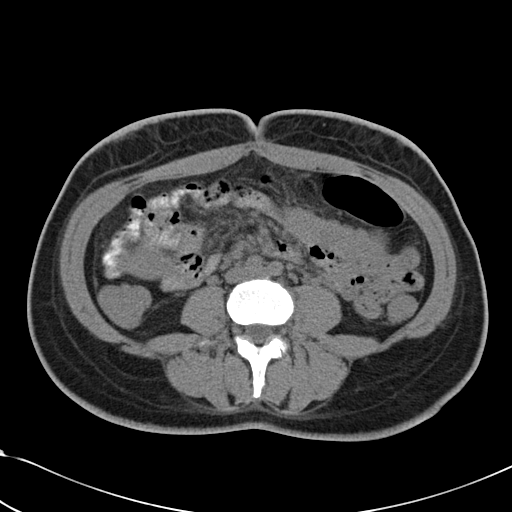
[im 56/88  soft-tissue]
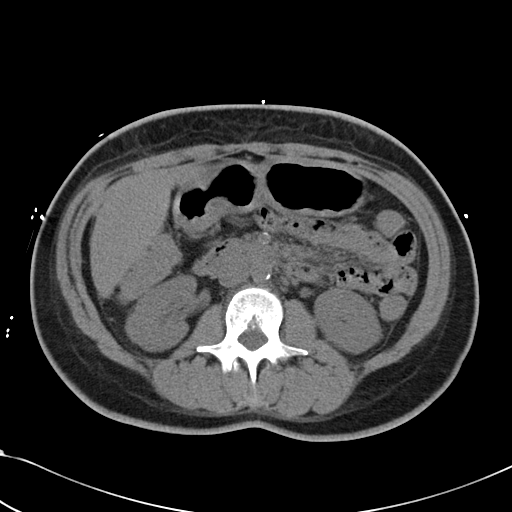
[im 60/88  soft-tissue]
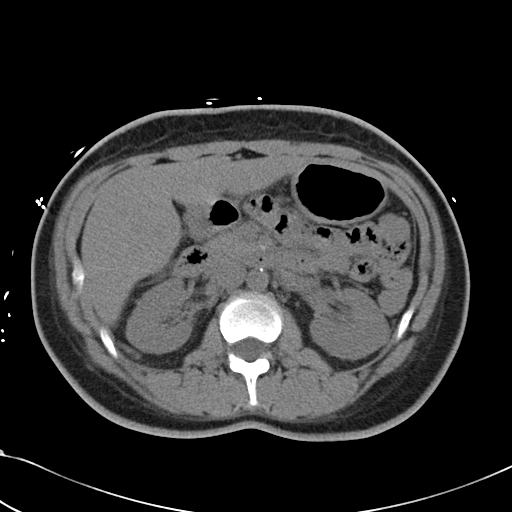
[im 60/88  bone]
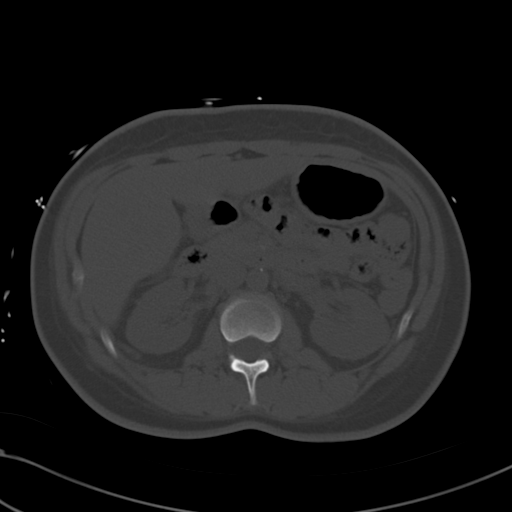
[im 68/88  soft-tissue]
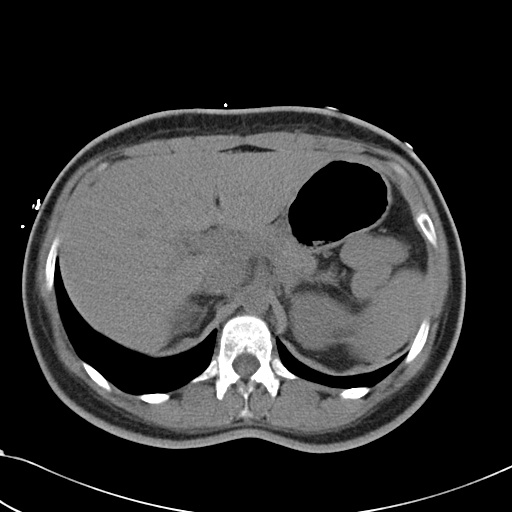
[im 72/88  lung]
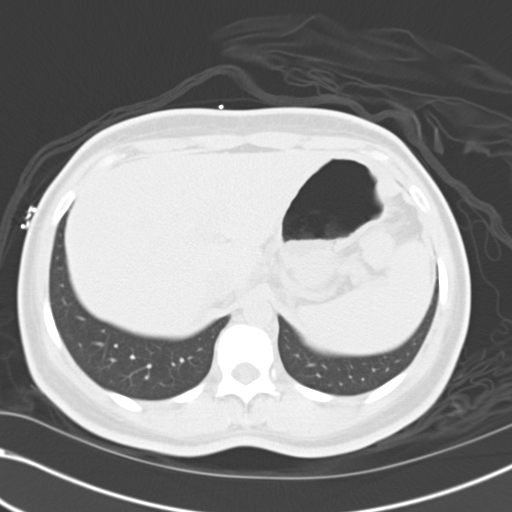
[im 76/88  soft-tissue]
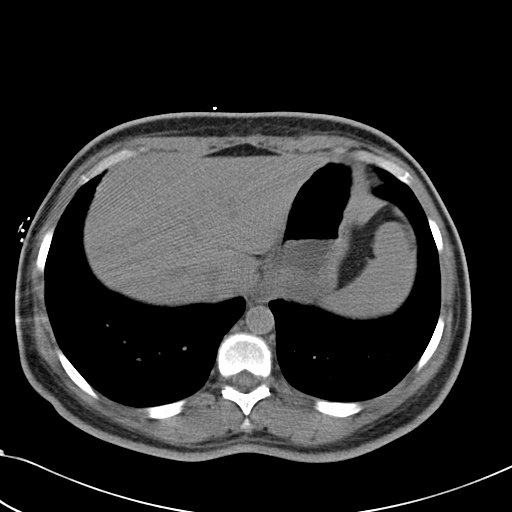
[im 76/88  lung]
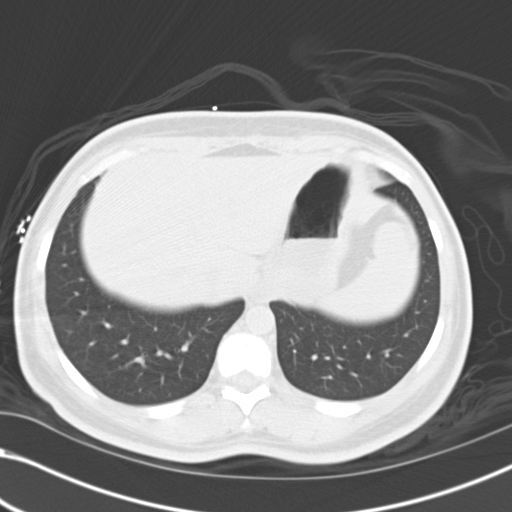
[im 80/88  lung]
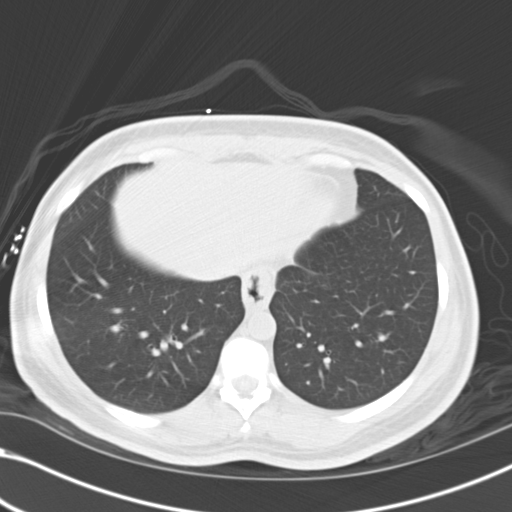
[im 84/88  soft-tissue]
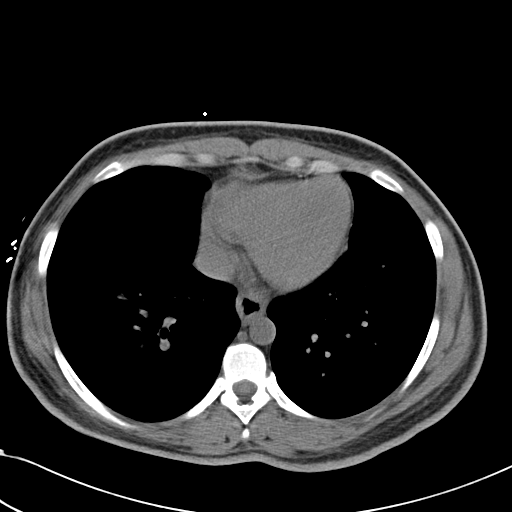
[im 84/88  lung]
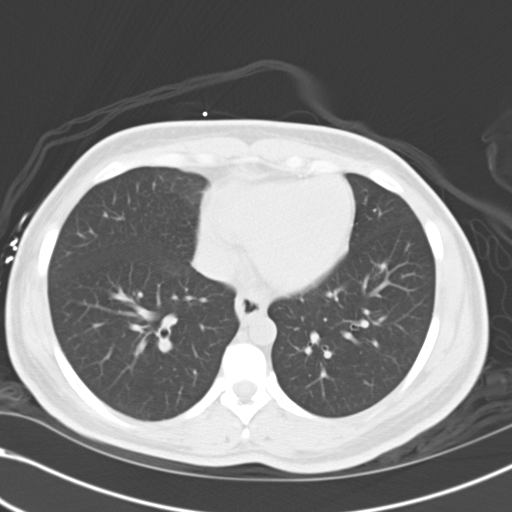

[14 of 32 positions shown; findings below may reference images not displayed]

FINDINGS: Exam detail is diminished secondary to lack of IV contrast material.

Lower chest: The lung bases are clear. No pleural effusion
identified.

Hepatobiliary: Previous cholecystectomy. The liver appears normal.
No biliary dilatation identified.

Pancreas: Unremarkable appearance of the pancreas.

Spleen: The spleen is unremarkable.

Adrenals/Urinary Tract: Unremarkable appearance of the adrenal
glands. Normal appearance of the kidneys. No hydronephrosis or
hydroureter identified. There is moderate distension of the urinary
bladder.

Stomach/Bowel: The stomach is normal. The small bowel loops have a
normal caliber. There is no evidence for small bowel obstruction.
The appendix is visualized and appears normal. Evaluation of the
colon is diminished due to lack of both IV contrast material and
oral contrast within the colon. Within this limitation there may be
mild diffuse colonic wall thickening versus colonic under
distention. No evidence for large bowel obstruction. No pneumatosis
or bowel perforation.

Vascular/Lymphatic: Calcified atherosclerotic disease involves the
abdominal aorta. No aneurysm. No enlarged retroperitoneal or
mesenteric adenopathy. No enlarged pelvic or inguinal lymph nodes.

Reproductive: The uterus and the adnexal structures are
unremarkable.

Other: No free fluid or fluid collections identified within the
abdomen or pelvis.

Musculoskeletal: No aggressive lytic or sclerotic bone lesions.
IMPRESSION: 1. Exam detail diminished due to lack of IV contrast material.
Additionally, the oral contrast material fills only the normal
appearing small bowel loops.
2. Incomplete distention versus mild wall thickening involving the
colon which may indicate colitis. No pneumatosis, abnormal bowel
distention or bowel perforation identified.
3. Previous cholecystectomy
4. Aortic atherosclerosis.

## 2015-12-25 IMAGING — XA IR US GUIDE VASC ACCESS RIGHT
1 series · 1 of 1 positions shown · non-contrast
Comparison: none

CLINICAL DATA: Nausea vomiting.  Needs  IV access.

EXAM:
PICC PLACEMENT WITH ULTRASOUND AND FLUOROSCOPY
FLUOROSCOPY TIME:  6 seconds, 3 mGy
TECHNIQUE: After written informed consent was obtained, patient was placed in
the supine position on angiographic table. Patency of the right
basilic vein was confirmed with ultrasound with image documentation.
An appropriate skin site was determined. Skin site was marked.
Region was prepped using maximum barrier technique including cap and
mask, sterile gown, sterile gloves, large sterile sheet, and
Chlorhexidine as cutaneous antisepsis. The region was infiltrated
locally with 1% lidocaine. Under real-time ultrasound guidance, the
right basilic vein was accessed with a 21 gauge micropuncture
needle; the needle tip within the vein was confirmed with ultrasound
image documentation. Needle exchanged over a 018 guidewire for a
peel-away sheath, through which a 5-French double-lumen power
injectable PICC trimmed to 33cm was advanced, positioned with its
tip near the cavoatrial junction. Spot chest radiograph confirms
appropriate catheter position. Catheter was flushed per protocol and
secured externally. The patient tolerated procedure well.
COMPLICATIONS:
COMPLICATIONS
none

[Series 300: line placements · 1 of 1 slices shown]
[im 1/1]
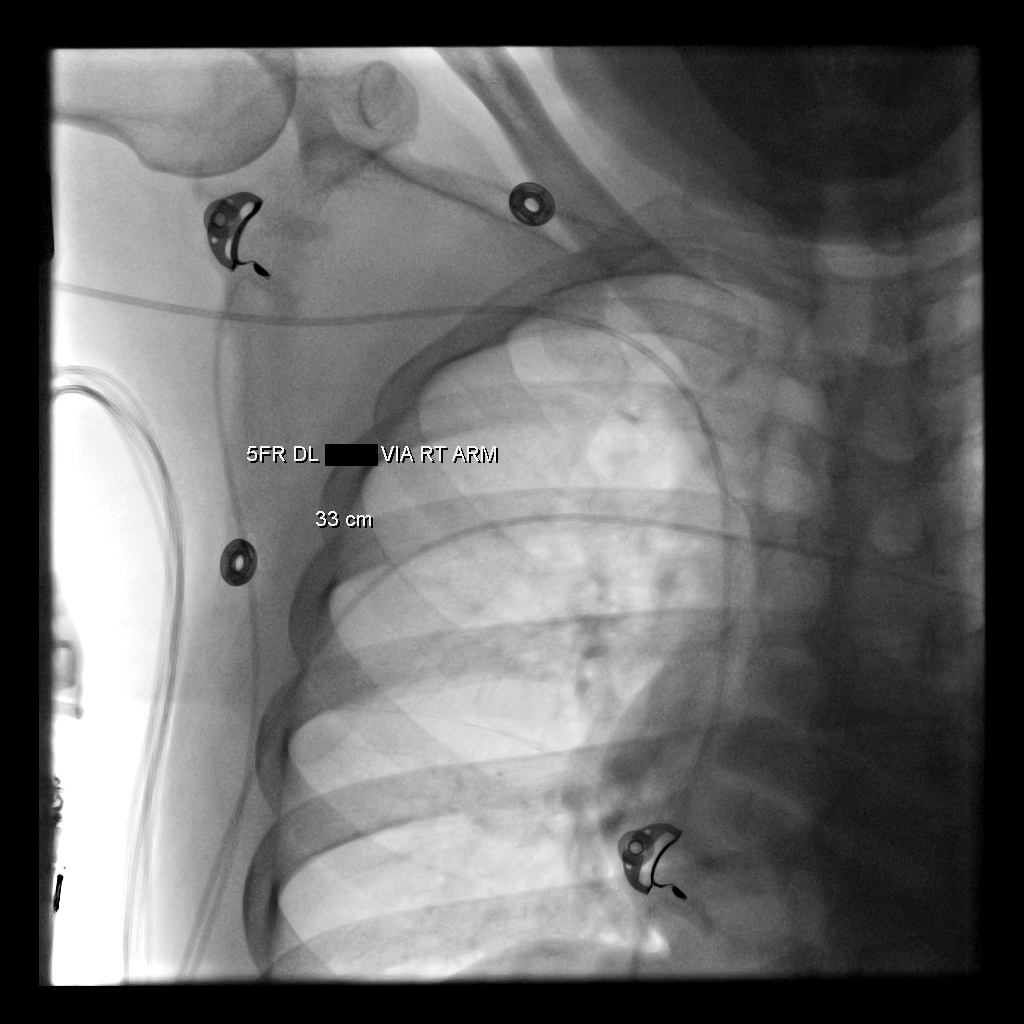

[1 of 1 positions shown; findings below may reference images not displayed]

IMPRESSION: 1. Technically successful five French double lumen power injectable
PICC placement

## 2016-01-05 IMAGING — CR DG ABDOMEN 2V
2 series · 2 of 2 positions shown · non-contrast
Comparison: CT abdomen and pelvis March 10, 2015

CLINICAL DATA: Two day history of nausea and vomiting with
abdominal pain

EXAM:
ABDOMEN - 2 VIEW

[w abdomen upright]
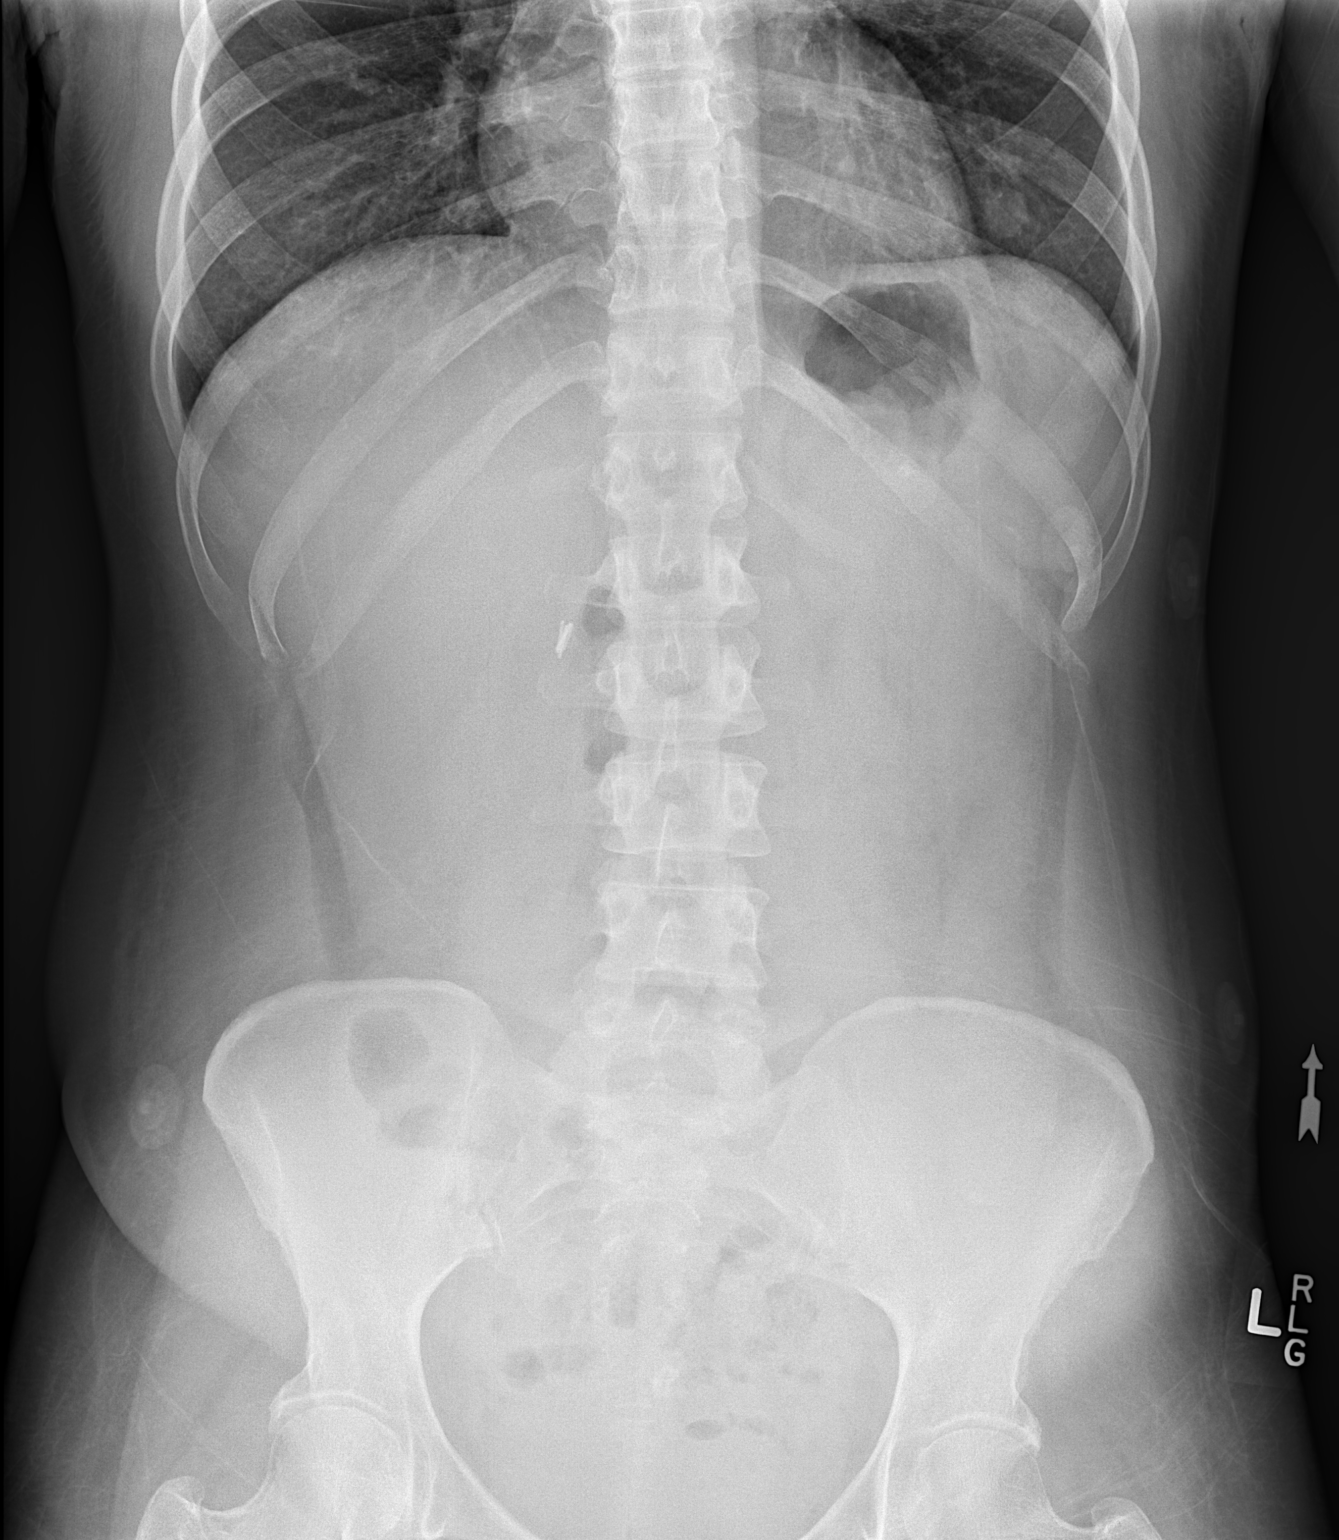

[t abdomen supine]
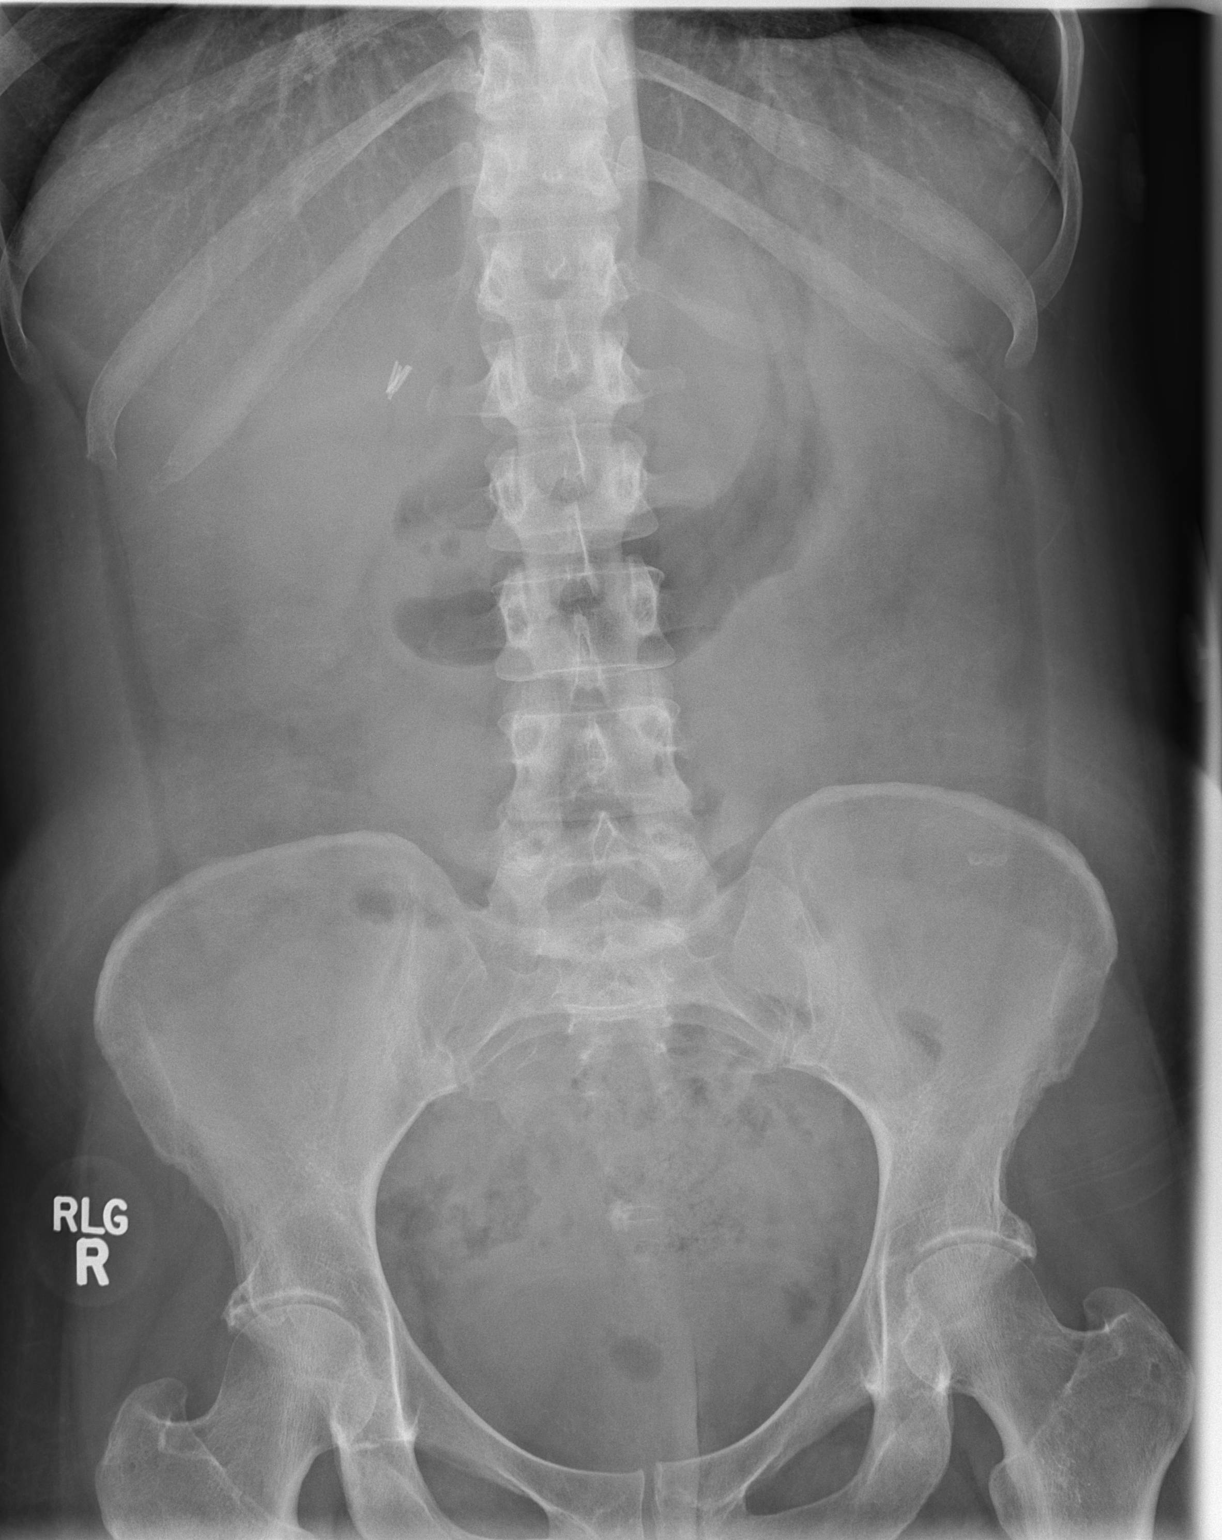

[2 of 2 positions shown; findings below may reference images not displayed]

FINDINGS: Supine and upright images were obtained. There is a relative paucity
of gas. There is no bowel dilatation or air-fluid level suggesting
obstruction. No free air. A small benign-appearing calcification is
noted in the mid pelvic region. Lung bases are clear. There are
surgical clips in the right upper quadrant region.
IMPRESSION: Relative paucity of gas. This finding may be seen normally. It also,
however, may be seen with early ileus or enteritis. Obstruction is
not felt to be likely. No free air.

## 2016-01-11 ENCOUNTER — Emergency Department (HOSPITAL_COMMUNITY)
Admission: EM | Admit: 2016-01-11 | Discharge: 2016-01-11 | Disposition: A | Discharge status: Home / Self Care | Payer: 59 | Attending: Emergency Medicine | Admitting: Emergency Medicine

## 2016-01-11 ENCOUNTER — Encounter (HOSPITAL_COMMUNITY): Payer: Self-pay | Admitting: Emergency Medicine

## 2016-01-11 DIAGNOSIS — R112 Nausea with vomiting, unspecified: Secondary | ICD-10-CM

## 2016-01-11 DIAGNOSIS — E785 Hyperlipidemia, unspecified: Secondary | ICD-10-CM | POA: Insufficient documentation

## 2016-01-11 DIAGNOSIS — I10 Essential (primary) hypertension: Secondary | ICD-10-CM | POA: Insufficient documentation

## 2016-01-11 DIAGNOSIS — Z794 Long term (current) use of insulin: Secondary | ICD-10-CM | POA: Insufficient documentation

## 2016-01-11 DIAGNOSIS — R000 Tachycardia, unspecified: Secondary | ICD-10-CM | POA: Insufficient documentation

## 2016-01-11 DIAGNOSIS — E1143 Type 2 diabetes mellitus with diabetic autonomic (poly)neuropathy: Secondary | ICD-10-CM | POA: Insufficient documentation

## 2016-01-11 DIAGNOSIS — Z79899 Other long term (current) drug therapy: Secondary | ICD-10-CM | POA: Insufficient documentation

## 2016-01-11 DIAGNOSIS — F329 Major depressive disorder, single episode, unspecified: Secondary | ICD-10-CM | POA: Insufficient documentation

## 2016-01-11 DIAGNOSIS — K3184 Gastroparesis: Secondary | ICD-10-CM

## 2016-01-11 LAB — URINALYSIS, ROUTINE W REFLEX MICROSCOPIC
Bilirubin Urine: NEGATIVE
Glucose, UA: >1000 mg/dL — AB
Hgb urine dipstick: NEGATIVE
Ketones, ur: >80 mg/dL — AB
Nitrite: NEGATIVE
Protein, ur: 30 mg/dL — AB
Specific Gravity, Urine: 1.02 (ref 1.005–1.030)
pH: 6 (ref 5.0–8.0)

## 2016-01-11 LAB — CBC
HCT: 29.1 % — ABNORMAL LOW (ref 36.0–46.0)
Hemoglobin: 9.1 g/dL — ABNORMAL LOW (ref 12.0–15.0)
MCH: 23.3 pg — ABNORMAL LOW (ref 26.0–34.0)
MCHC: 31.3 g/dL (ref 30.0–36.0)
MCV: 74.6 fL — ABNORMAL LOW (ref 78.0–100.0)
Platelets: 457 10*3/uL — ABNORMAL HIGH (ref 150–400)
RBC: 3.9 MIL/uL (ref 3.87–5.11)
RDW: 17.7 % — ABNORMAL HIGH (ref 11.5–15.5)
WBC: 5.6 10*3/uL (ref 4.0–10.5)

## 2016-01-11 LAB — COMPREHENSIVE METABOLIC PANEL
ALT: 17 U/L (ref 14–54)
AST: 20 U/L (ref 15–41)
Albumin: 3.8 g/dL (ref 3.5–5.0)
Alkaline Phosphatase: 88 U/L (ref 38–126)
Anion gap: 14 (ref 5–15)
BUN: 18 mg/dL (ref 6–20)
CO2: 17 mmol/L — ABNORMAL LOW (ref 22–32)
Calcium: 9.2 mg/dL (ref 8.9–10.3)
Chloride: 103 mmol/L (ref 101–111)
Creatinine, Ser: 0.99 mg/dL (ref 0.44–1.00)
GFR calc Af Amer: >60 mL/min (ref >60)
GFR calc non Af Amer: >60 mL/min (ref >60)
Glucose, Bld: 364 mg/dL — ABNORMAL HIGH (ref 65–99)
Potassium: 4.6 mmol/L (ref 3.5–5.1)
Sodium: 134 mmol/L — ABNORMAL LOW (ref 135–145)
Total Bilirubin: 1.2 mg/dL (ref 0.3–1.2)
Total Protein: 7.2 g/dL (ref 6.5–8.1)

## 2016-01-11 LAB — CBG MONITORING, ED
GLUCOSE-CAPILLARY: 351 mg/dL — AB (ref 65–99)
Glucose-Capillary: 343 mg/dL — ABNORMAL HIGH (ref 65–99)
Glucose-Capillary: 268 mg/dL — ABNORMAL HIGH (ref 65–99)

## 2016-01-11 LAB — URINE MICROSCOPIC-ADD ON: RBC / HPF: NONE SEEN RBC/hpf (ref 0–5)

## 2016-01-11 LAB — LIPASE, BLOOD: Lipase: 20 U/L (ref 11–51)

## 2016-01-11 MED ORDER — INSULIN ASPART 100 UNIT/ML ~~LOC~~ SOLN: 0.0000 [IU] | Freq: Three times a day (TID) | SUBCUTANEOUS | Status: DC

## 2016-01-11 MED ORDER — LORAZEPAM 2 MG/ML IJ SOLN
1.0000 mg | Freq: Once | INTRAMUSCULAR | Status: DC
  Filled 2016-01-11: qty 1

## 2016-01-11 MED ORDER — INSULIN ASPART PROT & ASPART (70-30 MIX) 100 UNIT/ML ~~LOC~~ SUSP
8.0000 [IU] | Freq: Once | SUBCUTANEOUS | Status: AC
  Administered 2016-01-11: 8 [IU] via SUBCUTANEOUS
  Filled 2016-01-11: qty 10

## 2016-01-11 MED ORDER — METOCLOPRAMIDE HCL 10 MG PO TABS: 10.0000 mg | ORAL_TABLET | Freq: Three times a day (TID) | ORAL | Status: DC

## 2016-01-11 MED ORDER — LORAZEPAM 2 MG/ML IJ SOLN
1.0000 mg | Freq: Once | INTRAMUSCULAR | Status: AC
  Administered 2016-01-11: 1 mg via INTRAVENOUS
  Filled 2016-01-11: qty 1

## 2016-01-11 MED ORDER — FENTANYL CITRATE (PF) 100 MCG/2ML IJ SOLN: 50.0000 ug | Freq: Once | INTRAMUSCULAR | Status: DC

## 2016-01-11 MED ORDER — FENTANYL CITRATE (PF) 100 MCG/2ML IJ SOLN
50.0000 ug | Freq: Once | INTRAMUSCULAR | Status: AC
  Administered 2016-01-11: 50 ug via INTRAMUSCULAR
  Filled 2016-01-11: qty 2

## 2016-01-11 MED ORDER — INSULIN ASPART PROT & ASPART (70-30 MIX) 100 UNIT/ML ~~LOC~~ SUSP
8.0000 [IU] | Freq: Once | SUBCUTANEOUS | Status: AC
  Administered 2016-01-11: 8 [IU] via SUBCUTANEOUS

## 2016-01-11 MED ORDER — SODIUM CHLORIDE 0.9 % IV BOLUS (SEPSIS)
1000.0000 mL | Freq: Once | INTRAVENOUS | Status: AC
  Administered 2016-01-11: 1000 mL via INTRAVENOUS

## 2016-01-11 MED ORDER — LORAZEPAM 2 MG/ML IJ SOLN
1.0000 mg | Freq: Once | INTRAMUSCULAR | Status: AC
  Administered 2016-01-11: 1 mg via INTRAMUSCULAR

## 2016-01-11 NOTE — ED Notes (Signed)
Delay in medication administration and blood draw due to difficult stick

## 2016-01-11 NOTE — ED Notes (Signed)
Pt repositioned for comfort and warm blanket given.  Helped pt drink additional sprite.  Pt is sleepy (from ativan?).

## 2016-01-11 NOTE — ED Notes (Signed)
Bed: WA07 Expected date:  Expected time:  Means of arrival:  Comments: EMS nausea

## 2016-01-11 NOTE — ED Notes (Signed)
Non reactive for both non reactive to antibody and antigen.

## 2016-01-11 NOTE — ED Notes (Signed)
4 unsuccessful IV attempts.

## 2016-01-11 NOTE — ED Notes (Signed)
Pt BIB EMS from home c/o abdominal pain and vomiting; pain began yesterday morning and vomiting began at 2300; hx of gastroparesis and DM.

## 2016-01-11 NOTE — ED Notes (Signed)
Gave pt ice water and diet sprite

## 2016-01-11 NOTE — ED Notes (Signed)
Pt assisted to restroom.  

## 2016-01-11 NOTE — ED Provider Notes (Signed)
This patient's care was assumed from Florene Glen, NP at shift change. Please see her note for further.  Briefly, patient with history of diabetic gastroparesis who presented to the ED with two hours of nausea and vomiting. At shift change plan is for the patient to finish fluids and PO trial and discharge.  My evaluation the patient reports she is feeling better. Her abdomen is soft and nontender to palpation. The glucose is 343. Will provide with by mouth trial and with insulin subcutaneous. Patient has a diagnosis of QT prolongation on her chart. On chart review I see only one recent EKG with borderline QT prolongation. Other EKGs showing normal QT interval. Patient reports she is taking Reglan at home for her nausea but this would be concerning if she does have QT prolongation. I obtained an EKG which showed a normal QT interval. I feel it is safe for the patient to continue Reglan. Patient's blood sugar improved to 268. She tolerated water and Sprite without vomiting. She reports she is feeling better. We will discharge with prescription for Reglan and close follow-up by primary care. I discussed strict and specific return precautions. I advised the patient to follow-up with their primary care provider this week. I advised the patient to return to the emergency department with new or worsening symptoms or new concerns. The patient verbalized understanding and agreement with plan.    Results for orders placed or performed during the hospital encounter of 01/11/16  Lipase, blood  Result Value Ref Range   Lipase 20 11 - 51 U/L  Comprehensive metabolic panel  Result Value Ref Range   Sodium 134 (L) 135 - 145 mmol/L   Potassium 4.6 3.5 - 5.1 mmol/L   Chloride 103 101 - 111 mmol/L   CO2 17 (L) 22 - 32 mmol/L   Glucose, Bld 364 (H) 65 - 99 mg/dL   BUN 18 6 - 20 mg/dL   Creatinine, Ser 0.99 0.44 - 1.00 mg/dL   Calcium 9.2 8.9 - 10.3 mg/dL   Total Protein 7.2 6.5 - 8.1 g/dL   Albumin 3.8 3.5 - 5.0  g/dL   AST 20 15 - 41 U/L   ALT 17 14 - 54 U/L   Alkaline Phosphatase 88 38 - 126 U/L   Total Bilirubin 1.2 0.3 - 1.2 mg/dL   GFR calc non Af Amer >60 >60 mL/min   GFR calc Af Amer >60 >60 mL/min   Anion gap 14 5 - 15  CBC  Result Value Ref Range   WBC 5.6 4.0 - 10.5 K/uL   RBC 3.90 3.87 - 5.11 MIL/uL   Hemoglobin 9.1 (L) 12.0 - 15.0 g/dL   HCT 29.1 (L) 36.0 - 46.0 %   MCV 74.6 (L) 78.0 - 100.0 fL   MCH 23.3 (L) 26.0 - 34.0 pg   MCHC 31.3 30.0 - 36.0 g/dL   RDW 17.7 (H) 11.5 - 15.5 %   Platelets 457 (H) 150 - 400 K/uL  Urinalysis, Routine w reflex microscopic  Result Value Ref Range   Color, Urine YELLOW YELLOW   APPearance CLEAR CLEAR   Specific Gravity, Urine 1.020 1.005 - 1.030   pH 6.0 5.0 - 8.0   Glucose, UA >1000 (A) NEGATIVE mg/dL   Hgb urine dipstick NEGATIVE NEGATIVE   Bilirubin Urine NEGATIVE NEGATIVE   Ketones, ur >80 (A) NEGATIVE mg/dL   Protein, ur 30 (A) NEGATIVE mg/dL   Nitrite NEGATIVE NEGATIVE   Leukocytes, UA TRACE (A) NEGATIVE  Urine microscopic-add on  Result Value Ref Range   Squamous Epithelial / LPF 0-5 (A) NONE SEEN   WBC, UA 6-30 0 - 5 WBC/hpf   RBC / HPF NONE SEEN 0 - 5 RBC/hpf   Bacteria, UA FEW (A) NONE SEEN  POC CBG, ED  Result Value Ref Range   Glucose-Capillary 351 (H) 65 - 99 mg/dL  CBG monitoring, ED  Result Value Ref Range   Glucose-Capillary 343 (H) 65 - 99 mg/dL  CBG monitoring, ED  Result Value Ref Range   Glucose-Capillary 268 (H) 65 - 99 mg/dL   No results found.  Filed Vitals:   01/11/16 0630 01/11/16 0700 01/11/16 0730 01/11/16 0800  BP: 136/62 136/63 142/72   Pulse: 100 94 93   Temp:    98.3 F (36.8 C)  TempSrc:    Axillary  Resp:      SpO2: 97% 94% 99%     Diabetic gastroparesis (HCC)  Non-intractable vomiting with nausea, vomiting of unspecified type    Waynetta Pean, PA-C 01/11/16 0845   [name], MD 01/21/16 2142

## 2016-01-11 NOTE — ED Provider Notes (Signed)
CSN: [id number]     Arrival date & time 01/11/16  0151 History   First MD Initiated Contact with Patient 01/11/16 0210     Chief Complaint  Patient presents with  . Abdominal Pain     (Consider location/radiation/quality/duration/timing/severity/associated sxs/prior Treatment) HPI Comments: This a 50 year old female, history of gastroparesis who presents with 2 hours of nausea and vomiting.  She states it started about midnight.  She tried one Zofran without relief.  She does report generalized abdominal cramping.  She says her blood sugars been in the 300s all day. She states her primary care physician is Dr. Mcarthur Rossetti.  She has not seen him in a while.  There's been no change in her medications  Patient is a 50 y.o. female presenting with abdominal pain. The history is provided by the patient.  Abdominal Pain Pain location:  Generalized Pain quality: aching   Pain severity:  Moderate Onset quality:  Gradual Timing:  Intermittent Progression:  Worsening Chronicity:  Chronic Context: retching   Relieved by:  Nothing Worsened by:  Nothing tried Ineffective treatments: Zofran. Associated symptoms: nausea and vomiting   Associated symptoms: no chest pain, no chills, no diarrhea, no dysuria, no fever and no shortness of breath     Past Medical History  Diagnosis Date  . Diabetes mellitus without complication ([place])   . Essential hypertension   . HLD (hyperlipidemia)   . GERD (gastroesophageal reflux disease)   . Depression   . Gastroparesis    Past Surgical History  Procedure Laterality Date  . Retinal detachment surgery    . Posterior vitrectomy and membrane peel-left eye  09/28/2002  . Posterior vitrectomy and membrane peel-right eye  03/16/2002  . Eye surgery    . Gailstones    . Esophagogastroduodenoscopy  09/27/2014    at [facility], Dr Rolan Lipa. biospy neg for celiac, neg for H pylori.   . Colonoscopy  09/27/2014    at [facility]   Family History  Problem Relation Age of  Onset  . Cystic fibrosis Mother   . Hypertension Father   . Diabetes Brother   . Hypertension Maternal Grandmother    Social History  Substance Use Topics  . Smoking status: Never Smoker   . Smokeless tobacco: Never Used  . Alcohol Use: No   OB History    No data available     Review of Systems  Constitutional: Negative for fever and chills.  Respiratory: Negative for shortness of breath.   Cardiovascular: Negative for chest pain.  Gastrointestinal: Positive for nausea, vomiting and abdominal pain. Negative for diarrhea.  Genitourinary: Negative for dysuria.  Skin: Negative for wound.  All other systems reviewed and are negative.     Allergies  Anesthetics, amide; Penicillins; Buprenorphine hcl; and Encainide  Home Medications   Prior to Admission medications   Medication Sig Start Date End Date Taking? Authorizing Provider  albuterol (PROVENTIL HFA;VENTOLIN HFA) 108 (90 Base) MCG/ACT inhaler Inhale 2 puffs into the lungs every 6 (six) hours as needed for wheezing or shortness of breath. 10/20/15   [name], MD  amLODipine (NORVASC) 2.5 MG tablet Take 1 tablet (2.5 mg total) by mouth daily. 12/01/15   Lavina Hamman, MD  atenolol (TENORMIN) 25 MG tablet Take 1 tablet (25 mg total) by mouth daily. 12/01/15   Lavina Hamman, MD  atorvastatin (LIPITOR) 20 MG tablet Take 1 tablet (20 mg total) by mouth daily at 6 PM. 06/25/15   Allie Bossier, MD  Blood Glucose Monitoring  Suppl (TRUE METRIX METER) DEVI 1 each by Does not apply route 3 (three) times daily before meals. 07/19/15   [name], MD  busPIRone (BUSPAR) 5 MG tablet Take 2 tablets (10 mg total) by mouth 3 (three) times daily. 12/01/15   Lavina Hamman, MD  cetirizine (ZYRTEC) 10 MG tablet Take 1 tablet (10 mg total) by mouth daily. 10/20/15   [name], MD  docusate sodium (COLACE) 100 MG capsule Take 1 capsule (100 mg total) by mouth 2 (two) times daily. 09/05/15   Modena Jansky, MD  feeding supplement, GLUCERNA  SHAKE, (GLUCERNA SHAKE) LIQD Take 237 mLs by mouth 3 (three) times daily between meals. Patient not taking: Reported on 11/28/2015 09/05/15   Modena Jansky, MD  Ferrous Fumarate (HEMOCYTE - 106 MG FE) 324 (106 Fe) MG TABS tablet Take 1 tablet (106 mg of iron total) by mouth 2 (two) times daily. Patient taking differently: Take 1 tablet by mouth daily.  09/05/15   Modena Jansky, MD  FLUoxetine (PROZAC) 40 MG capsule Take 40 mg by mouth daily.    Historical Provider, MD  glucose blood (TRUE METRIX BLOOD GLUCOSE TEST) test strip Use 3 times daily before meals 07/19/15   [name], MD  insulin aspart (NOVOLOG) 100 UNIT/ML injection Inject 0-15 Units into the skin 3 (three) times daily with meals. Per sliding scale 10/25/15   [name], MD  insulin detemir (LEVEMIR) 100 UNIT/ML injection Inject 0.25 mLs (25 Units total) into the skin daily. 10/25/15   [name], MD  Insulin Syringe-Needle U-100 (BD INSULIN SYRINGE ULTRAFINE) 31G X 15/64" 0.5 ML MISC 1 each by Does not apply route 4 (four) times daily as needed. 08/16/15   [name], MD  metoCLOPramide (REGLAN) 10 MG tablet Take 1 tablet (10 mg total) by mouth 4 (four) times daily -  before meals and at bedtime. 12/01/15   Lavina Hamman, MD  ondansetron (ZOFRAN ODT) 4 MG disintegrating tablet Take 1 tablet (4 mg total) by mouth every 8 (eight) hours as needed for nausea. 11/16/15   Shanker Kristeen Mans, MD  pantoprazole (PROTONIX) 40 MG tablet Take 40 mg by mouth daily. Reported on 07/19/2015 01/04/15   Historical Provider, MD  polyethylene glycol powder (GLYCOLAX/MIRALAX) powder Take 17 g by mouth daily as needed for moderate constipation. Reported on 07/19/2015 04/06/15   Historical Provider, MD  pregabalin (LYRICA) 150 MG capsule Take 1 capsule (150 mg total) by mouth 2 (two) times daily. 10/25/15   [name], MD  TRUEPLUS LANCETS 28G MISC 1 each by Does not apply route 3 (three) times daily before meals. 07/19/15   [name], MD  vitamin B-12  (CYANOCOBALAMIN) 1000 MCG tablet Take 1 tablet (1,000 mcg total) by mouth daily. 12/01/15   Lavina Hamman, MD   BP 128/88 mmHg  Pulse 107  Temp(Src) 98.3 F (36.8 C) (Oral)  Resp 20  SpO2 100%  LMP 12/15/2015 (Approximate) Physical Exam  Constitutional: She appears well-developed and well-nourished.  HENT:  Head: Normocephalic.  Mouth/Throat: Mucous membranes are dry.  Eyes: Pupils are equal, round, and reactive to light.  Neck: Normal range of motion.  Cardiovascular: Regular rhythm.  Tachycardia present.   Pulmonary/Chest: Effort normal.  Abdominal: Soft. Bowel sounds are normal. She exhibits no distension. There is generalized tenderness.  Musculoskeletal: Normal range of motion.  Neurological: She is alert.  Skin: Skin is warm.  Vitals reviewed.   ED Course  Procedures (including critical care time) Labs Review Labs  Reviewed  LIPASE, BLOOD  COMPREHENSIVE METABOLIC PANEL  CBC  URINALYSIS, ROUTINE W REFLEX MICROSCOPIC (NOT AT [facility])    Imaging Review No results found. I have personally reviewed and evaluated these images and lab results as part of my medical decision-making.   EKG Interpretation None     Patient has been treated with IV fluid, I am Ativan IM fentanyl for the abdominal pain and subcutaneous insulin.  She has had one episode of vomiting since medication abdominal pain is significantly decreased Reexamination shows that she has improved as far as her vital signs given.  She is more alert and oriented.  She is able to make eye contact and carry on a conversation.  She states she feels like that her anxiety is increasing.  She will be given an additional dose of IV Ativan, which will help with the nausea as well as and reassess in approximately 1 hour. MDM   Final diagnoses:  None         Junius Creamer, NP 01/11/16 [id number]   [name], MD 01/21/16 2142

## 2016-01-11 NOTE — Discharge Instructions (Signed)
Gastroparesis °Gastroparesis, also called delayed gastric emptying, is a condition in which food takes longer than normal to empty from the stomach. The condition is usually long-lasting (chronic). °CAUSES °This condition may be caused by: °· An endocrine disorder, such as hypothyroidism or diabetes. Diabetes is the most common cause of this condition. °· A nervous system disease, such as Parkinson disease or multiple sclerosis. °· Cancer, infection, or surgery of the stomach or vagus nerve. °· A connective tissue disorder, such as scleroderma. °· Certain medicines. °In most cases, the cause is not known. °RISK FACTORS °This condition is more likely to develop in: °· People with certain disorders, including endocrine disorders, eating disorders, amyloidosis, and scleroderma. °· People with certain diseases, including Parkinson disease or multiple sclerosis. °· People with cancer or infection of the stomach or vagus nerve. °· People who have had surgery on the stomach or vagus nerve. °· People who take certain medicines. °· Women. °SYMPTOMS °Symptoms of this condition include: °· An early feeling of fullness when eating. °· Nausea. °· Weight loss. °· Vomiting. °· Heartburn. °· Abdominal bloating. °· Inconsistent blood glucose levels. °· Lack of appetite. °· Acid from the stomach coming up into the esophagus (gastroesophageal reflux). °· Spasms of the stomach. °Symptoms may come and go. °DIAGNOSIS °This condition is diagnosed with tests, such as: °· Tests that check how long it takes food to move through the stomach and intestines. These tests include: °· Upper gastrointestinal (GI) series. In this test, X-rays of the intestines are taken after you drink a liquid. The liquid makes the intestines show up better on the X-rays. °· Gastric emptying scintigraphy. In this test, scans are taken after you eat food that contains a small amount of radioactive material. °· Wireless capsule GI monitoring system. This test  involves swallowing a capsule that records information about movement through the stomach. °· Gastric manometry. This test measures electrical and muscular activity in the stomach. It is done with a thin tube that is passed down the throat and into the stomach. °· Endoscopy. This test checks for abnormalities in the lining of the stomach. It is done with a long, thin tube that is passed down the throat and into the stomach. °· An ultrasound. This test can help rule out gallbladder disease or pancreatitis as a cause of your symptoms. It uses sound waves to take pictures of the inside of your body. °TREATMENT °There is no cure for gastroparesis. This condition may be managed with: °· Treatment of the underlying condition causing the gastroparesis. °· Lifestyle changes, including exercise and dietary changes. Dietary changes can include: °· Changes in what and when you eat. °· Eating smaller meals more often. °· Eating low-fat foods. °· Eating low-fiber forms of high-fiber foods, such as cooked vegetables instead of raw vegetables. °· Having liquid foods in place of solid foods. Liquid foods are easier to digest. °· Medicines. These may be given to control nausea and vomiting and to stimulate stomach muscles. °· Getting food through a feeding tube. This may be done in severe cases. °· A gastric neurostimulator. This is a device that is inserted into the body with surgery. It helps improve stomach emptying and control nausea and vomiting. °HOME CARE INSTRUCTIONS °· Follow your health care provider's instructions about exercise and diet. °· Take medicines only as directed by your health care provider. °SEEK MEDICAL CARE IF: °· Your symptoms do not improve with treatment. °· You have new symptoms. °SEEK IMMEDIATE MEDICAL CARE IF: °· You have   severe abdominal pain that does not improve with treatment. °· You have nausea that does not go away. °· You cannot keep fluids down. °  °This information is not intended to replace  advice given to you by your health care provider. Make sure you discuss any questions you have with your health care provider. °  °Document Released: 06/11/2005 Document Revised: 10/26/2014 Document Reviewed: 06/07/2014 °Elsevier Interactive Patient Education ©2016 [employer] ° °Nausea and Vomiting °Nausea is a sick feeling that often comes before throwing up (vomiting). Vomiting is a reflex where stomach contents come out of your mouth. Vomiting can cause severe loss of body fluids (dehydration). Children and elderly adults can become dehydrated quickly, especially if they also have diarrhea. Nausea and vomiting are symptoms of a condition or disease. It is important to find the cause of your symptoms. °CAUSES  °· Direct irritation of the stomach lining. This irritation can result from increased acid production (gastroesophageal reflux disease), infection, food poisoning, taking certain medicines (such as nonsteroidal anti-inflammatory drugs), alcohol use, or tobacco use. °· Signals from the brain. These signals could be caused by a headache, heat exposure, an inner ear disturbance, increased pressure in the brain from injury, infection, a tumor, or a concussion, pain, emotional stimulus, or metabolic problems. °· An obstruction in the gastrointestinal tract (bowel obstruction). °· Illnesses such as diabetes, hepatitis, gallbladder problems, appendicitis, kidney problems, cancer, sepsis, atypical symptoms of a heart attack, or eating disorders. °· Medical treatments such as chemotherapy and radiation. °· Receiving medicine that makes you sleep (general anesthetic) during surgery. °DIAGNOSIS °Your caregiver may ask for tests to be done if the problems do not improve after a few days. Tests may also be done if symptoms are severe or if the reason for the nausea and vomiting is not clear. Tests may include: °· Urine tests. °· Blood tests. °· Stool tests. °· Cultures (to look for evidence of infection). °· X-rays or  other imaging studies. °Test results can help your caregiver make decisions about treatment or the need for additional tests. °TREATMENT °You need to stay well hydrated. Drink frequently but in small amounts. You may wish to drink water, sports drinks, clear broth, or eat frozen ice pops or gelatin dessert to help stay hydrated. When you eat, eating slowly may help prevent nausea. There are also some antinausea medicines that may help prevent nausea. °HOME CARE INSTRUCTIONS  °· Take all medicine as directed by your caregiver. °· If you do not have an appetite, do not force yourself to eat. However, you must continue to drink fluids. °· If you have an appetite, eat a normal diet unless your caregiver tells you differently. °¨ Eat a variety of complex carbohydrates (rice, wheat, potatoes, bread), lean meats, yogurt, fruits, and vegetables. °¨ Avoid high-fat foods because they are more difficult to digest. °· Drink enough water and fluids to keep your urine clear or pale yellow. °· If you are dehydrated, ask your caregiver for specific rehydration instructions. Signs of dehydration may include: °¨ Severe thirst. °¨ Dry lips and mouth. °¨ Dizziness. °¨ Dark urine. °¨ Decreasing urine frequency and amount. °¨ Confusion. °¨ Rapid breathing or pulse. °SEEK IMMEDIATE MEDICAL CARE IF:  °· You have blood or brown flecks (like coffee grounds) in your vomit. °· You have black or bloody stools. °· You have a severe headache or stiff neck. °· You are confused. °· You have severe abdominal pain. °· You have chest pain or trouble breathing. °· You do not urinate at least once   every 8 hours. °· You develop cold or clammy skin. °· You continue to vomit for longer than 24 to 48 hours. °· You have a fever. °MAKE SURE YOU:  °· Understand these instructions. °· Will watch your condition. °· Will get help right away if you are not doing well or get worse. °  °This information is not intended to replace advice given to you by your health  care provider. Make sure you discuss any questions you have with your health care provider. °  °Document Released: 06/11/2005 Document Revised: 09/03/2011 Document Reviewed: 11/08/2010 °Elsevier Interactive Patient Education ©2016 [employer] Inc[employer] ° °

## 2016-01-19 ENCOUNTER — Encounter (HOSPITAL_COMMUNITY): Payer: Self-pay | Admitting: Emergency Medicine

## 2016-01-19 ENCOUNTER — Inpatient Hospital Stay (HOSPITAL_COMMUNITY): Payer: Self-pay

## 2016-01-19 ENCOUNTER — Inpatient Hospital Stay (HOSPITAL_COMMUNITY)
Admission: EM | Admit: 2016-01-19 | Discharge: 2016-01-21 | DRG: 638 | Disposition: A | Discharge status: Home / Self Care | Payer: Self-pay | Attending: Internal Medicine | Admitting: Internal Medicine

## 2016-01-19 DIAGNOSIS — E8729 Other acidosis: Secondary | ICD-10-CM | POA: Diagnosis present

## 2016-01-19 DIAGNOSIS — Z880 Allergy status to penicillin: Secondary | ICD-10-CM

## 2016-01-19 DIAGNOSIS — Z833 Family history of diabetes mellitus: Secondary | ICD-10-CM

## 2016-01-19 DIAGNOSIS — Z8249 Family history of ischemic heart disease and other diseases of the circulatory system: Secondary | ICD-10-CM

## 2016-01-19 DIAGNOSIS — F419 Anxiety disorder, unspecified: Secondary | ICD-10-CM | POA: Diagnosis present

## 2016-01-19 DIAGNOSIS — Z9114 Patient's other noncompliance with medication regimen: Secondary | ICD-10-CM

## 2016-01-19 DIAGNOSIS — D72829 Elevated white blood cell count, unspecified: Secondary | ICD-10-CM | POA: Diagnosis present

## 2016-01-19 DIAGNOSIS — Z794 Long term (current) use of insulin: Secondary | ICD-10-CM

## 2016-01-19 DIAGNOSIS — Z884 Allergy status to anesthetic agent status: Secondary | ICD-10-CM

## 2016-01-19 DIAGNOSIS — E785 Hyperlipidemia, unspecified: Secondary | ICD-10-CM | POA: Diagnosis present

## 2016-01-19 DIAGNOSIS — E860 Dehydration: Secondary | ICD-10-CM | POA: Diagnosis present

## 2016-01-19 DIAGNOSIS — E1110 Type 2 diabetes mellitus with ketoacidosis without coma: Secondary | ICD-10-CM | POA: Diagnosis present

## 2016-01-19 DIAGNOSIS — Z79899 Other long term (current) drug therapy: Secondary | ICD-10-CM

## 2016-01-19 DIAGNOSIS — I129 Hypertensive chronic kidney disease with stage 1 through stage 4 chronic kidney disease, or unspecified chronic kidney disease: Secondary | ICD-10-CM | POA: Diagnosis present

## 2016-01-19 DIAGNOSIS — E1043 Type 1 diabetes mellitus with diabetic autonomic (poly)neuropathy: Secondary | ICD-10-CM | POA: Diagnosis present

## 2016-01-19 DIAGNOSIS — F329 Major depressive disorder, single episode, unspecified: Secondary | ICD-10-CM | POA: Diagnosis present

## 2016-01-19 DIAGNOSIS — K219 Gastro-esophageal reflux disease without esophagitis: Secondary | ICD-10-CM | POA: Diagnosis present

## 2016-01-19 DIAGNOSIS — N179 Acute kidney failure, unspecified: Secondary | ICD-10-CM | POA: Diagnosis present

## 2016-01-19 DIAGNOSIS — E1010 Type 1 diabetes mellitus with ketoacidosis without coma: Principal | ICD-10-CM | POA: Diagnosis present

## 2016-01-19 DIAGNOSIS — E10319 Type 1 diabetes mellitus with unspecified diabetic retinopathy without macular edema: Secondary | ICD-10-CM | POA: Diagnosis present

## 2016-01-19 DIAGNOSIS — E1040 Type 1 diabetes mellitus with diabetic neuropathy, unspecified: Secondary | ICD-10-CM | POA: Diagnosis present

## 2016-01-19 DIAGNOSIS — E1143 Type 2 diabetes mellitus with diabetic autonomic (poly)neuropathy: Secondary | ICD-10-CM

## 2016-01-19 DIAGNOSIS — D509 Iron deficiency anemia, unspecified: Secondary | ICD-10-CM | POA: Diagnosis present

## 2016-01-19 DIAGNOSIS — E872 Acidosis: Secondary | ICD-10-CM | POA: Diagnosis present

## 2016-01-19 DIAGNOSIS — Z888 Allergy status to other drugs, medicaments and biological substances status: Secondary | ICD-10-CM

## 2016-01-19 DIAGNOSIS — R1115 Cyclical vomiting syndrome unrelated to migraine: Secondary | ICD-10-CM

## 2016-01-19 DIAGNOSIS — N182 Chronic kidney disease, stage 2 (mild): Secondary | ICD-10-CM | POA: Diagnosis present

## 2016-01-19 DIAGNOSIS — D638 Anemia in other chronic diseases classified elsewhere: Secondary | ICD-10-CM | POA: Diagnosis present

## 2016-01-19 DIAGNOSIS — K3184 Gastroparesis: Secondary | ICD-10-CM | POA: Diagnosis present

## 2016-01-19 LAB — BASIC METABOLIC PANEL
ANION GAP: 14 (ref 5–15)
ANION GAP: 9 (ref 5–15)
BUN: 22 mg/dL — ABNORMAL HIGH (ref 6–20)
BUN: 19 mg/dL (ref 6–20)
CALCIUM: 8.6 mg/dL — AB (ref 8.9–10.3)
CALCIUM: 8.6 mg/dL — AB (ref 8.9–10.3)
CO2: 12 mmol/L — ABNORMAL LOW (ref 22–32)
CO2: 13 mmol/L — AB (ref 22–32)
CREATININE: 1.25 mg/dL — AB (ref 0.44–1.00)
CREATININE: 1.13 mg/dL — AB (ref 0.44–1.00)
Chloride: 114 mmol/L — ABNORMAL HIGH (ref 101–111)
Chloride: 115 mmol/L — ABNORMAL HIGH (ref 101–111)
GFR, EST AFRICAN AMERICAN: 58 mL/min — AB (ref >60)
GFR, EST NON AFRICAN AMERICAN: 50 mL/min — AB (ref >60)
GFR, EST NON AFRICAN AMERICAN: 57 mL/min — AB (ref >60)
Glucose, Bld: 377 mg/dL — ABNORMAL HIGH (ref 65–99)
Glucose, Bld: 245 mg/dL — ABNORMAL HIGH (ref 65–99)
Potassium: 4.8 mmol/L (ref 3.5–5.1)
Potassium: 4.8 mmol/L (ref 3.5–5.1)
SODIUM: 140 mmol/L (ref 135–145)
SODIUM: 137 mmol/L (ref 135–145)

## 2016-01-19 LAB — GLUCOSE, CAPILLARY
GLUCOSE-CAPILLARY: 324 mg/dL — AB (ref 65–99)
GLUCOSE-CAPILLARY: 286 mg/dL — AB (ref 65–99)
Glucose-Capillary: 105 mg/dL — ABNORMAL HIGH (ref 65–99)
Glucose-Capillary: 155 mg/dL — ABNORMAL HIGH (ref 65–99)
Glucose-Capillary: 173 mg/dL — ABNORMAL HIGH (ref 65–99)
Glucose-Capillary: 231 mg/dL — ABNORMAL HIGH (ref 65–99)
Glucose-Capillary: 330 mg/dL — ABNORMAL HIGH (ref 65–99)
Glucose-Capillary: 398 mg/dL — ABNORMAL HIGH (ref 65–99)

## 2016-01-19 LAB — COMPREHENSIVE METABOLIC PANEL
ALT: 17 U/L (ref 14–54)
AST: 24 U/L (ref 15–41)
Albumin: 4.3 g/dL (ref 3.5–5.0)
Alkaline Phosphatase: 114 U/L (ref 38–126)
Anion gap: 20 — ABNORMAL HIGH (ref 5–15)
BILIRUBIN TOTAL: 1.3 mg/dL — AB (ref 0.3–1.2)
BUN: 22 mg/dL — ABNORMAL HIGH (ref 6–20)
CALCIUM: 9.6 mg/dL (ref 8.9–10.3)
CHLORIDE: 98 mmol/L — AB (ref 101–111)
CO2: 15 mmol/L — ABNORMAL LOW (ref 22–32)
CREATININE: 1.38 mg/dL — AB (ref 0.44–1.00)
GFR, EST AFRICAN AMERICAN: 51 mL/min — AB (ref >60)
GFR, EST NON AFRICAN AMERICAN: 44 mL/min — AB (ref >60)
Glucose, Bld: 620 mg/dL (ref 65–99)
Potassium: 4.7 mmol/L (ref 3.5–5.1)
Sodium: 133 mmol/L — ABNORMAL LOW (ref 135–145)
TOTAL PROTEIN: 8.2 g/dL — AB (ref 6.5–8.1)

## 2016-01-19 LAB — URINALYSIS, ROUTINE W REFLEX MICROSCOPIC
BILIRUBIN URINE: NEGATIVE
Glucose, UA: >1000 mg/dL — AB
Hgb urine dipstick: NEGATIVE
LEUKOCYTES UA: NEGATIVE
NITRITE: NEGATIVE
PROTEIN: NEGATIVE mg/dL
Specific Gravity, Urine: 1.024 (ref 1.005–1.030)
pH: 5.5 (ref 5.0–8.0)

## 2016-01-19 LAB — POC URINE PREG, ED: PREG TEST UR: NEGATIVE

## 2016-01-19 LAB — BLOOD GAS, VENOUS
ACID-BASE DEFICIT: 12.2 mmol/L — AB (ref 0.0–2.0)
Bicarbonate: 14.2 mEq/L — ABNORMAL LOW (ref 20.0–24.0)
O2 Saturation: 67.6 %
PCO2 VEN: 35.8 mmHg — AB (ref 45.0–50.0)
PO2 VEN: 45.2 mmHg — AB (ref 31.0–45.0)
Patient temperature: 98.6
TCO2: 13.8 mmol/L (ref 0–100)
pH, Ven: 7.222 — ABNORMAL LOW (ref 7.250–7.300)

## 2016-01-19 LAB — CBC WITH DIFFERENTIAL/PLATELET
BASOS ABS: 0 10*3/uL (ref 0.0–0.1)
Basophils Relative: 0 %
EOS ABS: 0 10*3/uL (ref 0.0–0.7)
EOS PCT: 0 %
HCT: 32.9 % — ABNORMAL LOW (ref 36.0–46.0)
Hemoglobin: 9.8 g/dL — ABNORMAL LOW (ref 12.0–15.0)
Lymphocytes Relative: 7 %
Lymphs Abs: 0.9 10*3/uL (ref 0.7–4.0)
MCH: 22.7 pg — ABNORMAL LOW (ref 26.0–34.0)
MCHC: 29.8 g/dL — ABNORMAL LOW (ref 30.0–36.0)
MCV: 76.2 fL — ABNORMAL LOW (ref 78.0–100.0)
MONO ABS: 0.2 10*3/uL (ref 0.1–1.0)
Monocytes Relative: 2 %
NEUTROS PCT: 91 %
Neutro Abs: 11.2 10*3/uL — ABNORMAL HIGH (ref 1.7–7.7)
PLATELETS: 435 10*3/uL — AB (ref 150–400)
RBC: 4.32 MIL/uL (ref 3.87–5.11)
RDW: 16.9 % — AB (ref 11.5–15.5)
WBC: 12.3 10*3/uL — AB (ref 4.0–10.5)

## 2016-01-19 LAB — CBG MONITORING, ED
GLUCOSE-CAPILLARY: 512 mg/dL — AB (ref 65–99)
Glucose-Capillary: 596 mg/dL (ref 65–99)

## 2016-01-19 LAB — URINE MICROSCOPIC-ADD ON: RBC / HPF: NONE SEEN RBC/hpf (ref 0–5)

## 2016-01-19 LAB — MAGNESIUM: MAGNESIUM: 2.1 mg/dL (ref 1.7–2.4)

## 2016-01-19 LAB — MRSA PCR SCREENING: MRSA BY PCR: NEGATIVE

## 2016-01-19 IMAGING — CR DG ABDOMEN ACUTE W/ 1V CHEST
3 series · 3 of 3 positions shown · non-contrast
Comparison: 03/21/2015

CLINICAL DATA: Nausea and vomiting beginning 3 hours ago.
Constipation for 5 days. History of gastroparesis.

EXAM:
DG ABDOMEN ACUTE W/ 1V CHEST

[w chest pa]
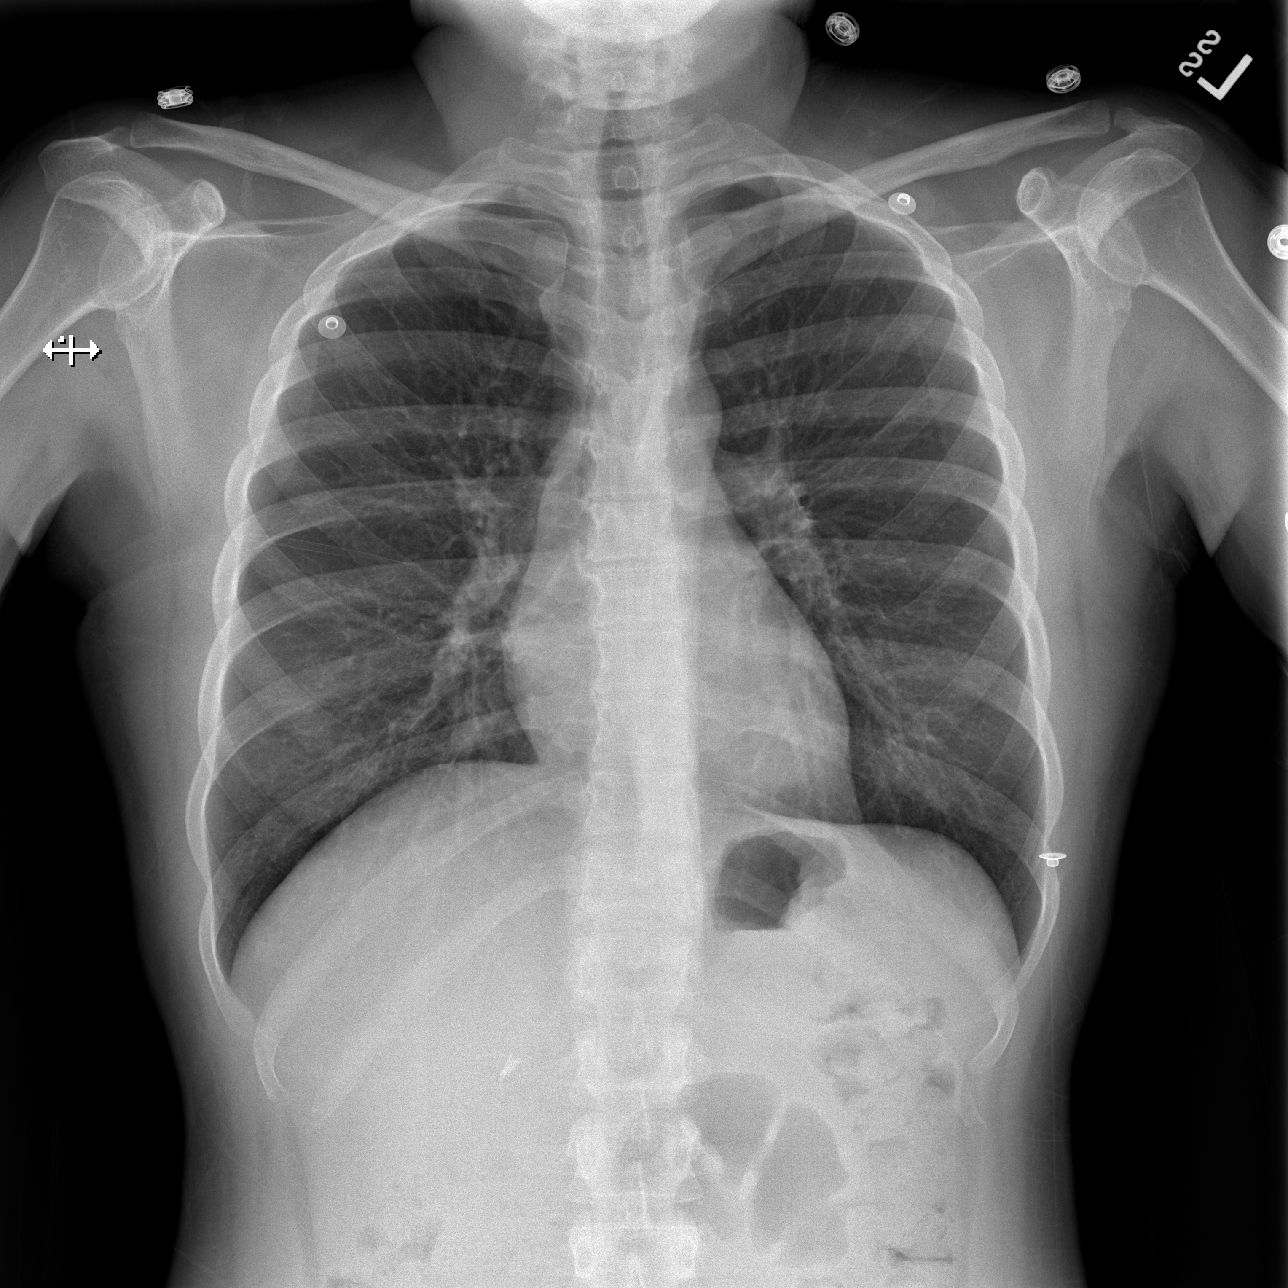

[w abdomen upright]
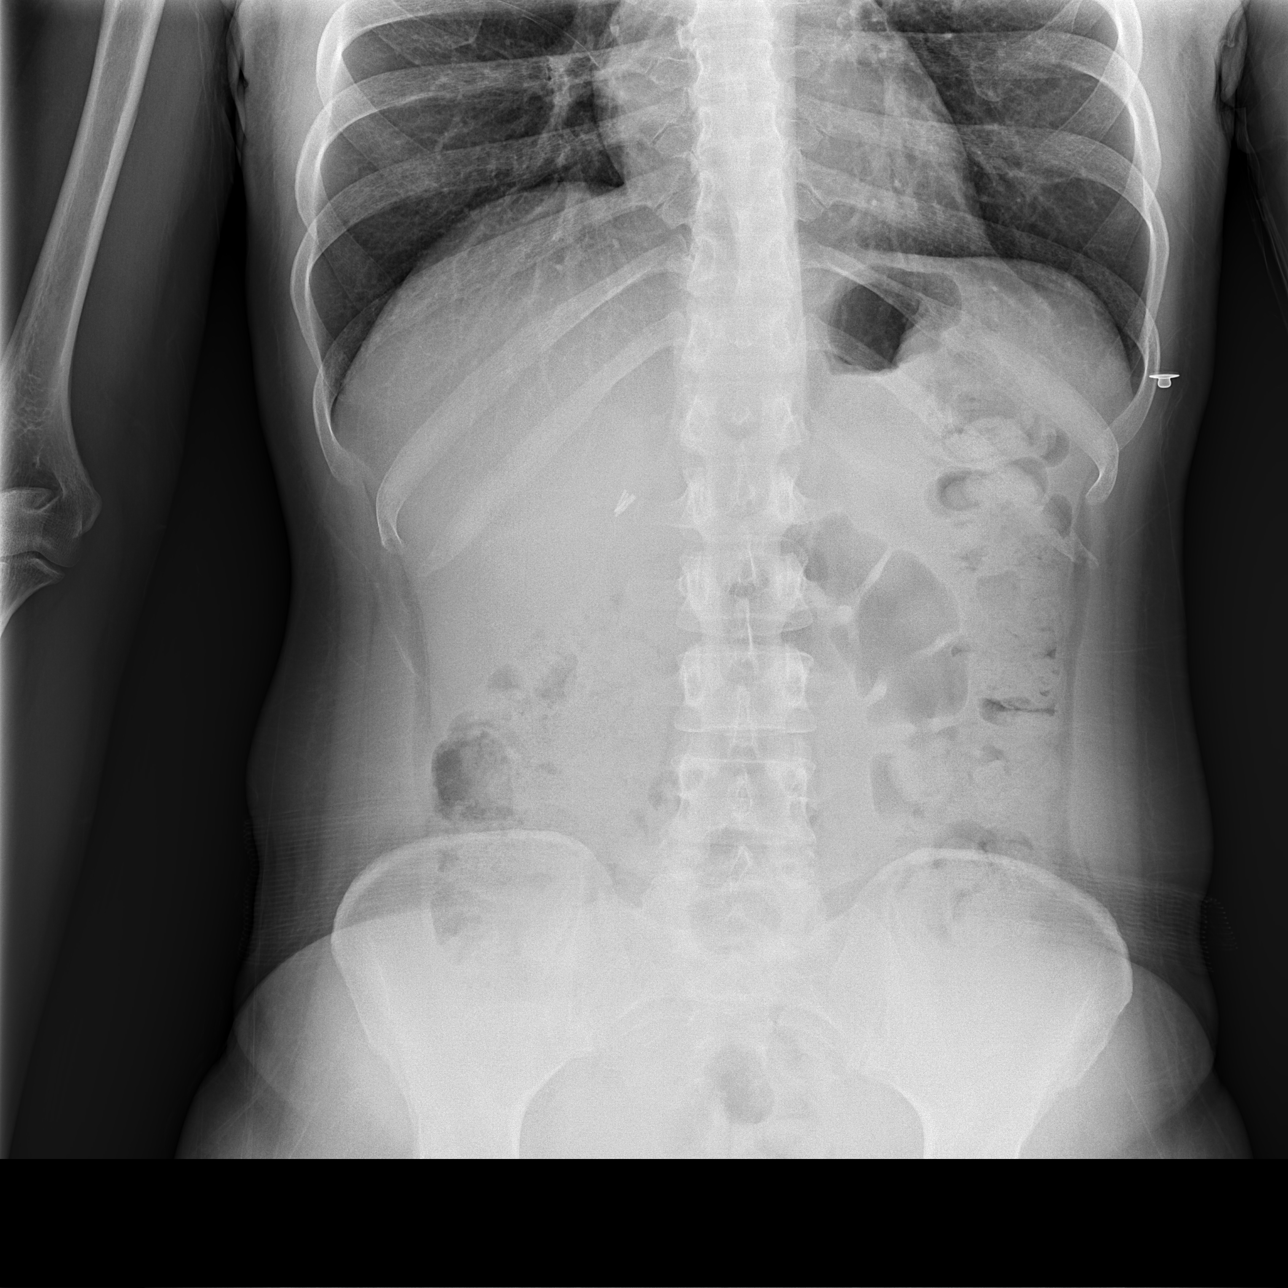

[t abdomen supine]
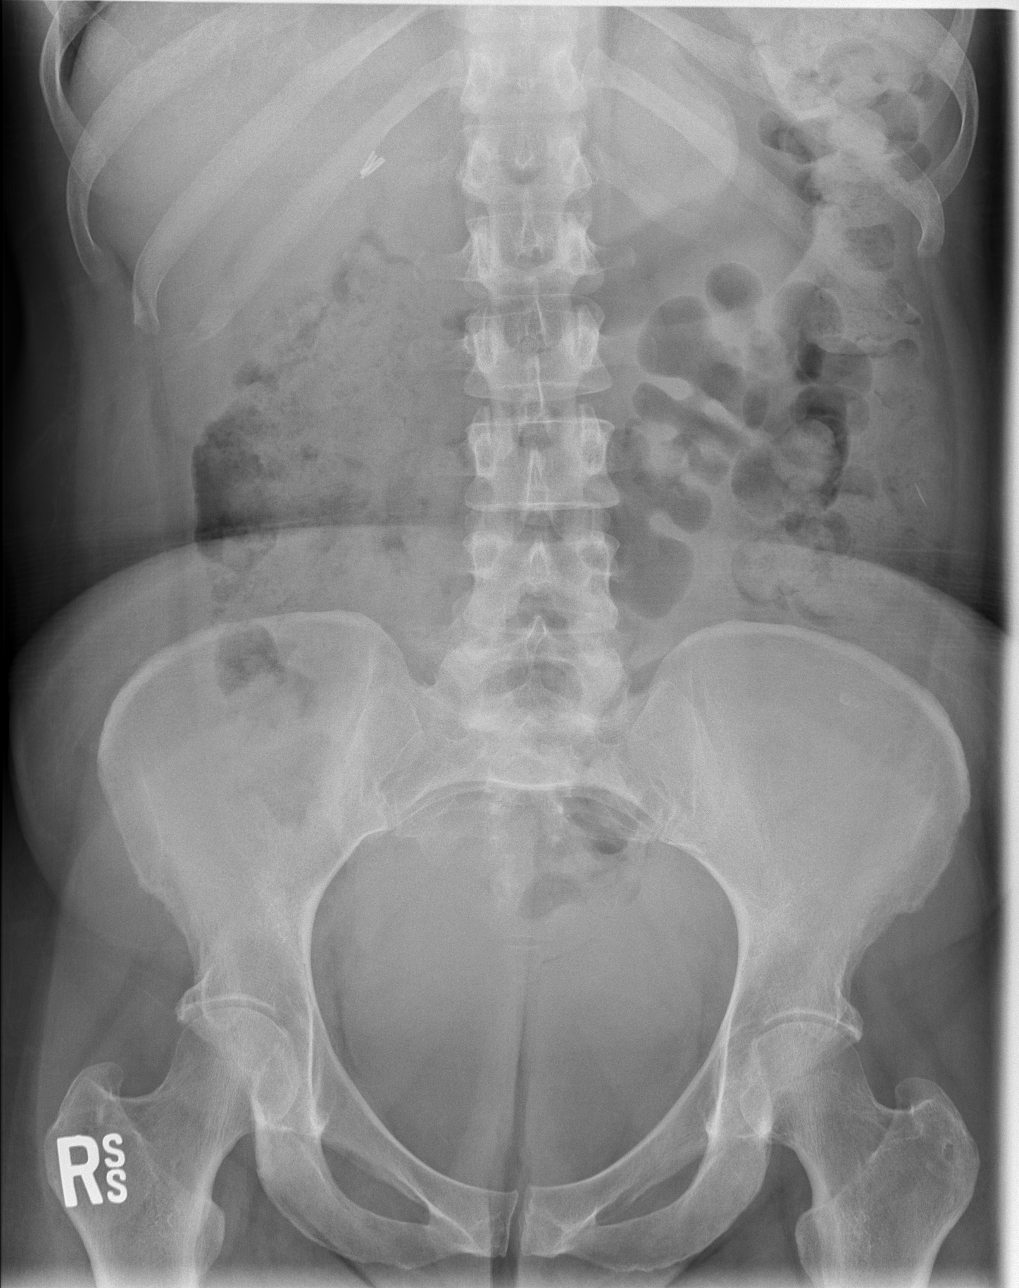

[3 of 3 positions shown; findings below may reference images not displayed]

FINDINGS: Normal heart size and pulmonary vascularity. No focal airspace
disease or consolidation in the lungs. No blunting of costophrenic
angles. No pneumothorax. Mediastinal contours appear intact.

Scattered gas and stool in the colon. No small or large bowel
distention. No free intra-abdominal air. No abnormal air-fluid
levels. No radiopaque stones. Visualized bones appear intact.
Surgical clips in the right upper quadrant.
IMPRESSION: No evidence of active pulmonary disease. Normal nonobstructive bowel
gas pattern. No gastric distention.

## 2016-01-19 MED ORDER — BUSPIRONE HCL 10 MG PO TABS
10.0000 mg | ORAL_TABLET | Freq: Three times a day (TID) | ORAL | Status: DC
  Administered 2016-01-19 – 2016-01-21 (×5): 10 mg via ORAL
  Filled 2016-01-19 (×7): qty 1

## 2016-01-19 MED ORDER — ENOXAPARIN SODIUM 40 MG/0.4ML ~~LOC~~ SOLN
40.0000 mg | SUBCUTANEOUS | Status: DC
  Administered 2016-01-19 – 2016-01-20 (×2): 40 mg via SUBCUTANEOUS
  Filled 2016-01-19 (×3): qty 0.4

## 2016-01-19 MED ORDER — DEXTROSE-NACL 5-0.45 % IV SOLN: INTRAVENOUS | Status: DC

## 2016-01-19 MED ORDER — PREGABALIN 75 MG PO CAPS
150.0000 mg | ORAL_CAPSULE | Freq: Once | ORAL | Status: AC
  Administered 2016-01-19: 150 mg via ORAL
  Filled 2016-01-19: qty 2

## 2016-01-19 MED ORDER — METOCLOPRAMIDE HCL 5 MG/ML IJ SOLN
10.0000 mg | Freq: Once | INTRAMUSCULAR | Status: AC
Start: 2016-01-19 — End: 2016-01-19
  Administered 2016-01-19: 10 mg via INTRAVENOUS
  Filled 2016-01-19: qty 2

## 2016-01-19 MED ORDER — ONDANSETRON HCL 4 MG/2ML IJ SOLN
4.0000 mg | Freq: Once | INTRAMUSCULAR | Status: AC
Start: 2016-01-19 — End: 2016-01-19
  Administered 2016-01-19: 4 mg via INTRAVENOUS
  Filled 2016-01-19: qty 2

## 2016-01-19 MED ORDER — DEXTROSE-NACL 5-0.45 % IV SOLN
INTRAVENOUS | Status: DC
  Administered 2016-01-19: 21:00:00 via INTRAVENOUS

## 2016-01-19 MED ORDER — ALBUTEROL SULFATE HFA 108 (90 BASE) MCG/ACT IN AERS: 2.0000 | INHALATION_SPRAY | Freq: Four times a day (QID) | RESPIRATORY_TRACT | Status: DC | PRN

## 2016-01-19 MED ORDER — SODIUM CHLORIDE 0.9 % IV SOLN
INTRAVENOUS | Status: DC
  Administered 2016-01-19: 4.5 [IU]/h via INTRAVENOUS

## 2016-01-19 MED ORDER — SODIUM CHLORIDE 0.9 % IV BOLUS (SEPSIS)
500.0000 mL | Freq: Once | INTRAVENOUS | Status: AC
  Administered 2016-01-19: 500 mL via INTRAVENOUS

## 2016-01-19 MED ORDER — FLUOXETINE HCL 20 MG PO CAPS
40.0000 mg | ORAL_CAPSULE | Freq: Every day | ORAL | Status: DC
  Administered 2016-01-19 – 2016-01-21 (×3): 40 mg via ORAL
  Filled 2016-01-19 (×3): qty 2

## 2016-01-19 MED ORDER — SODIUM CHLORIDE 0.9 % IV SOLN: INTRAVENOUS | Status: DC

## 2016-01-19 MED ORDER — METOCLOPRAMIDE HCL 10 MG PO TABS
10.0000 mg | ORAL_TABLET | Freq: Three times a day (TID) | ORAL | Status: DC
  Administered 2016-01-19: 10 mg via ORAL
  Filled 2016-01-19: qty 1

## 2016-01-19 MED ORDER — SODIUM CHLORIDE 0.9 % IV BOLUS (SEPSIS)
1000.0000 mL | Freq: Once | INTRAVENOUS | Status: DC
  Administered 2016-01-19: 1000 mL via INTRAVENOUS

## 2016-01-19 MED ORDER — PANTOPRAZOLE SODIUM 40 MG PO TBEC
40.0000 mg | DELAYED_RELEASE_TABLET | Freq: Every day | ORAL | Status: DC
  Administered 2016-01-19 – 2016-01-21 (×3): 40 mg via ORAL
  Filled 2016-01-19 (×3): qty 1

## 2016-01-19 MED ORDER — INSULIN REGULAR HUMAN 100 UNIT/ML IJ SOLN: INTRAMUSCULAR | Status: DC

## 2016-01-19 MED ORDER — ALBUTEROL SULFATE (2.5 MG/3ML) 0.083% IN NEBU: 2.5000 mg | INHALATION_SOLUTION | Freq: Four times a day (QID) | RESPIRATORY_TRACT | Status: DC | PRN

## 2016-01-19 MED ORDER — SODIUM CHLORIDE 0.9 % IV SOLN
INTRAVENOUS | Status: DC
  Administered 2016-01-19 (×2): via INTRAVENOUS

## 2016-01-19 MED ORDER — LORAZEPAM 2 MG/ML IJ SOLN
0.5000 mg | Freq: Once | INTRAMUSCULAR | Status: AC
  Administered 2016-01-19: 0.5 mg via INTRAVENOUS
  Filled 2016-01-19: qty 1

## 2016-01-19 MED ORDER — INSULIN DETEMIR 100 UNIT/ML ~~LOC~~ SOLN
25.0000 [IU] | Freq: Every day | SUBCUTANEOUS | Status: DC
Start: 2016-01-19 — End: 2016-01-19

## 2016-01-19 MED ORDER — SODIUM CHLORIDE 0.9 % IV SOLN
INTRAVENOUS | Status: DC
  Filled 2016-01-19: qty 2.5

## 2016-01-19 MED ORDER — ZOLPIDEM TARTRATE 5 MG PO TABS
5.0000 mg | ORAL_TABLET | Freq: Every evening | ORAL | Status: DC | PRN
Start: 2016-01-19 — End: 2016-01-20
  Administered 2016-01-19: 5 mg via ORAL
  Filled 2016-01-19: qty 1

## 2016-01-19 MED ORDER — ATENOLOL 25 MG PO TABS
25.0000 mg | ORAL_TABLET | Freq: Every day | ORAL | Status: DC
  Administered 2016-01-19 – 2016-01-21 (×3): 25 mg via ORAL
  Filled 2016-01-19 (×3): qty 1

## 2016-01-19 MED ORDER — SODIUM CHLORIDE 0.9 % IV BOLUS (SEPSIS)
1000.0000 mL | Freq: Once | INTRAVENOUS | Status: AC
  Administered 2016-01-19: 1000 mL via INTRAVENOUS

## 2016-01-19 NOTE — Progress Notes (Signed)
Patient noted to have had 5 admissions within the last six months. Patient admitted with DKA. Patient's last admission 06/04 to 06/08 for DKA. Patient's pcp is listed as Dr. Brita Romp at [facility]. Patient discharged on 06/08 with visiting RN from [facility] per chart review. Patient has also been a patient of [facility]. EDCM will notify [facility] of patient's admission.

## 2016-01-19 NOTE — ED Notes (Signed)
CBG: [id number]

## 2016-01-19 NOTE — ED Notes (Signed)
Unable to collect labs PT IV team consult

## 2016-01-19 NOTE — ED Provider Notes (Signed)
[place] DEPT Provider Note   CSN: [id number] Arrival date & time: 01/19/16  0901  First Provider Contact:  None       History   Chief Complaint Chief Complaint  Patient presents with  . Nausea  . Emesis    HPI [name] is a 50 y.o. female.  HPI   N/v began at 5AM, 5-6 times, no blood Diarrhea, 4x Abdominal pain, top of abdomen   History limited by severe nausea, active emesis  Denies infectious symptoms Missed insulin dose last night  Past Medical History:  Diagnosis Date  . Depression   . Diabetes mellitus without complication ([place])   . Essential hypertension   . Gastroparesis   . GERD (gastroesophageal reflux disease)   . HLD (hyperlipidemia)     Patient Active Problem List   Diagnosis Date Noted  . Prolonged QT interval 11/27/2015  . Abnormal ECG 11/27/2015  . Diabetes mellitus without complication ([place])   . HLD (hyperlipidemia)   . GERD (gastroesophageal reflux disease)   . Gastroparesis   . Metabolic acidosis, normal anion gap (NAG)   . AKI (acute kidney injury) ([employer])   . Anemia, iron deficiency   . DKA, type 1, not at goal [facility])   . DKA (diabetic ketoacidoses) ([place]) 09/02/2015  . Hyponatremia 09/02/2015  . Vitamin B12 deficiency 08/16/2015  . Lactic acidosis 07/29/2015  . Acute pyelonephritis 07/29/2015  . Pyelonephritis   . Hypothermia   . Diabetic gastroparesis ([place])   . Diabetic neuropathy, type I diabetes mellitus ([place]) 05/18/2015  . Anemia 05/18/2015  . Anxiety and depression 05/18/2015  . Nausea & vomiting   . Diabetic ketoacidosis without coma associated with type 1 diabetes mellitus ([place])   . High anion gap metabolic acidosis [id number]    Past Surgical History:  Procedure Laterality Date  . COLONOSCOPY  09/27/2014   at [facility]  . ESOPHAGOGASTRODUODENOSCOPY  09/27/2014   at [facility], Dr Rolan Lipa. biospy neg for celiac, neg for H pylori.   . EYE SURGERY    . gailstones    . POSTERIOR VITRECTOMY AND MEMBRANE  PEEL-LEFT EYE  09/28/2002  . POSTERIOR VITRECTOMY AND MEMBRANE PEEL-RIGHT EYE  03/16/2002  . RETINAL DETACHMENT SURGERY      OB History    No data available       Home Medications    Prior to Admission medications   Medication Sig Start Date End Date Taking? Authorizing Provider  albuterol (PROVENTIL HFA;VENTOLIN HFA) 108 (90 Base) MCG/ACT inhaler Inhale 2 puffs into the lungs every 6 (six) hours as needed for wheezing or shortness of breath. 10/20/15  Yes [name], MD  ALPRAZolam (XANAX) 0.25 MG tablet Take 0.25 mg by mouth 2 (two) times daily as needed for anxiety.    Yes Historical Provider, MD  insulin aspart (NOVOLOG) 100 UNIT/ML injection Inject 0-15 Units into the skin 3 (three) times daily with meals. Per sliding scale 10/25/15  Yes [name], MD  insulin detemir (LEVEMIR) 100 UNIT/ML injection Inject 0.25 mLs (25 Units total) into the skin daily. Patient taking differently: Inject 20 Units into the skin at bedtime.  10/25/15  Yes [name], MD  lisinopril (PRINIVIL,ZESTRIL) 20 MG tablet Take 20 mg by mouth daily.   Yes Historical Provider, MD  metoCLOPramide (REGLAN) 10 MG tablet Take 1 tablet (10 mg total) by mouth 4 (four) times daily -  before meals and at bedtime. 01/11/16  Yes Waynetta Pean, PA-C  ondansetron (ZOFRAN ODT) 4 MG disintegrating tablet Take 1 tablet (  4 mg total) by mouth every 8 (eight) hours as needed for nausea. 11/16/15  Yes Shanker Kristeen Mans, MD  polyethylene glycol powder (GLYCOLAX/MIRALAX) powder Take 1 Container by mouth daily. Reported on 07/19/2015 04/06/15  Yes Historical Provider, MD  pregabalin (LYRICA) 150 MG capsule Take 1 capsule (150 mg total) by mouth 2 (two) times daily. 10/25/15  Yes [name], MD  Blood Glucose Monitoring Suppl (TRUE METRIX METER) DEVI 1 each by Does not apply route 3 (three) times daily before meals. 07/19/15   [name], MD  glucose blood (TRUE METRIX BLOOD GLUCOSE TEST) test strip Use 3 times daily before meals 07/19/15    [name], MD  Insulin Syringe-Needle U-100 (BD INSULIN SYRINGE ULTRAFINE) 31G X 15/64" 0.5 ML MISC 1 each by Does not apply route 4 (four) times daily as needed. 08/16/15   [name], MD  TRUEPLUS LANCETS 28G MISC 1 each by Does not apply route 3 (three) times daily before meals. 07/19/15   [name], MD    Family History Family History  Problem Relation Age of Onset  . Cystic fibrosis Mother   . Hypertension Father   . Diabetes Brother   . Hypertension Maternal Grandmother     Social History Social History  Substance Use Topics  . Smoking status: Never Smoker  . Smokeless tobacco: Never Used  . Alcohol use No     Allergies   Anesthetics, amide; Penicillins; Buprenorphine hcl; and Encainide   Review of Systems Review of Systems  Unable to perform ROS: Acuity of condition (intractable nausea)  Constitutional: Positive for fatigue. Negative for fever.  HENT: Negative for congestion and sore throat.   Respiratory: Negative for cough.   Cardiovascular: Negative for chest pain.  Gastrointestinal: Positive for abdominal pain, diarrhea, nausea and vomiting.  Endocrine: Positive for polyuria.  Genitourinary: Positive for frequency. Negative for dysuria.  Musculoskeletal: Negative for back pain.  Skin: Negative for rash.     Physical Exam Updated Vital Signs BP 178/95   Pulse 113  Temp 98.4 F (36.8 C) (Oral)   Resp 18   LMP 12/15/2015 (Approximate)   SpO2 100%   Physical Exam  Constitutional: She is oriented to person, place, and time. She appears well-developed and well-nourished. She appears ill. She appears distressed (frequent emesis).  HENT:  Head: Normocephalic and atraumatic.  Eyes: Conjunctivae and EOM are normal.  Neck: Normal range of motion.  Cardiovascular: Regular rhythm, normal heart sounds and intact distal pulses.  Tachycardia present.  Exam reveals no gallop and no friction rub.   No murmur heard. Pulmonary/Chest: Effort normal and breath  sounds normal. Tachypnea (mild) noted. No respiratory distress. She has no wheezes. She has no rales.  Abdominal: Soft. She exhibits no distension. There is no tenderness. There is no guarding.  Musculoskeletal: She exhibits no edema or tenderness.  Neurological: She is alert and oriented to person, place, and time.  Skin: Skin is warm and dry. No rash noted. She is not diaphoretic. No erythema.  Nursing note and vitals reviewed.    ED Treatments / Results  Labs (all labs ordered are listed, but only abnormal results are displayed) Labs Reviewed  COMPREHENSIVE METABOLIC PANEL - Abnormal; Notable for the following:       Result Value   Sodium 133 (*)    Chloride 98 (*)    CO2 15 (*)    Glucose, Bld 620 (*)    BUN 22 (*)    Creatinine, Ser 1.38 (*)  Total Protein 8.2 (*)    Total Bilirubin 1.3 (*)    GFR calc non Af Amer 44 (*)    GFR calc Af Amer 51 (*)    Anion gap 20 (*)    All other components within normal limits  CBC WITH DIFFERENTIAL/PLATELET - Abnormal; Notable for the following:    WBC 12.3 (*)    Hemoglobin 9.8 (*)    HCT 32.9 (*)    MCV 76.2 (*)    MCH 22.7 (*)    MCHC 29.8 (*)    RDW 16.9 (*)    Platelets 435 (*)    Neutro Abs 11.2 (*)    All other components within normal limits  URINALYSIS, ROUTINE W REFLEX MICROSCOPIC (NOT AT [facility]) - Abnormal; Notable for the following:    Glucose, UA >1000 (*)    Ketones, ur >80 (*)    All other components within normal limits  URINE MICROSCOPIC-ADD ON - Abnormal; Notable for the following:    Squamous Epithelial / LPF 0-5 (*)    Bacteria, UA RARE (*)    All other components within normal limits  BLOOD GAS, VENOUS - Abnormal; Notable for the following:    pH, Ven 7.222 (*)    pCO2, Ven 35.8 (*)    pO2, Ven 45.2 (*)    Bicarbonate 14.2 (*)    Acid-base deficit 12.2 (*)    All other components within normal limits  BASIC METABOLIC PANEL - Abnormal; Notable for the following:    Chloride 114 (*)    CO2 12 (*)     Glucose, Bld 377 (*)    BUN 22 (*)    Creatinine, Ser 1.25 (*)    Calcium 8.6 (*)    GFR calc non Af Amer 50 (*)    GFR calc Af Amer 58 (*)    All other components within normal limits  BASIC METABOLIC PANEL - Abnormal; Notable for the following:    Chloride 115 (*)    CO2 13 (*)    Glucose, Bld 245 (*)    Creatinine, Ser 1.13 (*)    Calcium 8.6 (*)    GFR calc non Af Amer 57 (*)    All other components within normal limits  GLUCOSE, CAPILLARY - Abnormal; Notable for the following:    Glucose-Capillary 398 (*)    All other components within normal limits  GLUCOSE, CAPILLARY - Abnormal; Notable for the following:    Glucose-Capillary 330 (*)    All other components within normal limits  GLUCOSE, CAPILLARY - Abnormal; Notable for the following:    Glucose-Capillary 324 (*)    All other components within normal limits  GLUCOSE, CAPILLARY - Abnormal; Notable for the following:    Glucose-Capillary 286 (*)    All other components within normal limits  GLUCOSE, CAPILLARY - Abnormal; Notable for the following:    Glucose-Capillary 231 (*)    All other components within normal limits  GLUCOSE, CAPILLARY - Abnormal; Notable for the following:    Glucose-Capillary 173 (*)    All other components within normal limits  GLUCOSE, CAPILLARY - Abnormal; Notable for the following:    Glucose-Capillary 155 (*)    All other components within normal limits  CBG MONITORING, ED - Abnormal; Notable for the following:    Glucose-Capillary 596 (*)    All other components within normal limits  CBG MONITORING, ED - Abnormal; Notable for the following:    Glucose-Capillary 512 (*)    All other components within  normal limits  MRSA PCR SCREENING  MAGNESIUM  HEMOGLOBIN [id number]  BASIC METABOLIC PANEL  BASIC METABOLIC PANEL  MAGNESIUM  BASIC METABOLIC PANEL  POC URINE PREG, ED  I-STAT VENOUS BLOOD GAS, ED  CBG MONITORING, ED    EKG  EKG Interpretation None       Radiology Dg Abd Acute  W/chest  Result Date: 01/19/2016 CLINICAL DATA:  Cyclical vomiting and nausea. EXAM: DG ABDOMEN ACUTE W/ 1V CHEST COMPARISON:  Radiographs dated 11/15/2015 and 07/29/2015 from pump FINDINGS: There is minimal air in the bowel. No appreciable dilated bowel. Surgical clips in the right upper quadrant consistent with previous cholecystectomy. No free air. Bones are normal. IMPRESSION: Benign-appearing abdomen and chest. Electronically Signed   By: Lorriane Shire M.D.   On: 01/19/2016 15:35   Procedures Procedures (including critical care time)  Medications Ordered in ED Medications  atenolol (TENORMIN) tablet 25 mg (25 mg Oral Given 01/19/16 2005)  busPIRone (BUSPAR) tablet 10 mg (10 mg Oral Given 01/19/16 2004)  FLUoxetine (PROZAC) capsule 40 mg (40 mg Oral Given 01/19/16 2006)  pantoprazole (PROTONIX) EC tablet 40 mg (40 mg Oral Given 01/19/16 2006)  metoCLOPramide (REGLAN) tablet 10 mg (10 mg Oral Given 01/19/16 2007)  0.9 %  sodium chloride infusion ( Intravenous Stopped 01/19/16 2120)  dextrose 5 %-0.45 % sodium chloride infusion ( Intravenous Rate/Dose Verify 01/19/16 2200)  enoxaparin (LOVENOX) injection 40 mg (40 mg Subcutaneous Given 01/19/16 1707)  insulin regular (NOVOLIN R,HUMULIN R) 250 Units in sodium chloride 0.9 % 250 mL (1 Units/mL) infusion (3.8 Units/hr Intravenous Rate/Dose Change 01/19/16 2241)  albuterol (PROVENTIL) (2.5 MG/3ML) 0.083% nebulizer solution 2.5 mg (not administered)  zolpidem (AMBIEN) tablet 5 mg (5 mg Oral Given 01/19/16 1844)  sodium chloride 0.9 % bolus 1,000 mL (1,000 mLs Intravenous New Bag/Given 01/19/16 1153)  ondansetron (ZOFRAN) injection 4 mg (4 mg Intravenous Given 01/19/16 1156)  LORazepam (ATIVAN) injection 0.5 mg (0.5 mg Intravenous Given 01/19/16 1156)  metoCLOPramide (REGLAN) injection 10 mg (10 mg Intravenous Given 01/19/16 1425)  sodium chloride 0.9 % bolus 500 mL (500 mLs Intravenous Given 01/19/16 1844)  pregabalin (LYRICA) capsule 150 mg (150 mg Oral  Given 01/19/16 2123)  sodium chloride 0.9 % bolus 500 mL (500 mLs Intravenous Given 01/19/16 2254)   CRITICAL CARE: DKA Performed by: Alvino Chapel   Total critical care time: 30 minutes  Critical care time was exclusive of separately billable procedures and treating other patients.  Critical care was necessary to treat or prevent imminent or life-threatening deterioration.  Critical care was time spent personally by me on the following activities: development of treatment plan with patient and/or surrogate as well as nursing, discussions with consultants, evaluation of patient's response to treatment, examination of patient, obtaining history from patient or surrogate, ordering and performing treatments and interventions, ordering and review of laboratory studies, ordering and review of radiographic studies, pulse oximetry and re-evaluation of patient's condition.   Initial Impression / Assessment and Plan / ED Course  I have reviewed the triage vital signs and the nursing notes.  Pertinent labs & imaging results that were available during my care of the patient were reviewed by me and considered in my medical decision making (see chart for details).  Clinical Course   51 year old female with a history of diabetes, hlpd, gastroparesis presents with concern for nausea, vomiting and diarrhea. There was a delay in labs today difficulty establishing IV access.  Labs returned concerning for diabetic ketoacidosis, with a bicarbonate 15, anion  gap of 20, pH is 7.2 on venous blood gas, and glucose of 620.  Patient given IV fluids and Zofran with no improvement in nausea, and was subsequently given Reglan. Insulin drip was initiated for DKA. Patient's to be admitted to stepdown unit for continued care of DKA. Likely etiology is missed insulin dose yesterday, no other infectious symptoms.  Final Clinical Impressions(s) / ED Diagnoses   Final diagnoses:  Diabetic ketoacidosis without coma  associated with type 1 diabetes mellitus [facility])    New Prescriptions Current Discharge Medication List       Gareth Morgan, MD 01/19/16 2308

## 2016-01-19 NOTE — Progress Notes (Signed)
Inpatient Diabetes Program Recommendations  AACE/ADA: New Consensus Statement on Inpatient Glycemic Control (2015)  Target Ranges:  Prepandial:   less than 140 mg/dL      Peak postprandial:   less than 180 mg/dL (1-2 hours)      Critically ill patients:  140 - 180 mg/dL   Results for [name], [name] (MRN [id number]) as of 01/19/2016 14:32  Ref. Range 01/19/2016 11:51  Sodium Latest Ref Range: 135 - 145 mmol/L 133 (L)  Potassium Latest Ref Range: 3.5 - 5.1 mmol/L 4.7  Chloride Latest Ref Range: 101 - 111 mmol/L 98 (L)  CO2 Latest Ref Range: 22 - 32 mmol/L 15 (L)  BUN Latest Ref Range: 6 - 20 mg/dL 22 (H)  Creatinine Latest Ref Range: 0.44 - 1.00 mg/dL 1.38 (H)  Calcium Latest Ref Range: 8.9 - 10.3 mg/dL 9.6  EGFR (Non-African Amer.) Latest Ref Range: >60 mL/min 44 (L)  EGFR (African American) Latest Ref Range: >60 mL/min 51 (L)  Glucose Latest Ref Range: 65 - 99 mg/dL 620 (HH)  Anion gap Latest Ref Range: 5 - 15  20 (H)    Admit with: DKA  History: DM, Gastroparesis  Home DM Meds: Levemir 25 units daily       Novolog SSI  Current Insulin Orders: IV Insulin drip     -Patient well known to the Inpatient Glycemic Control Team.  Patient has been seen in the ED 4 times since January 2017 and this is patient's 6th admission since January.  -Has been counseled on numerous pervious admissions about the importance of taking insulin and the importance of good glucose control at home.  -Note IV Insulin drip started at 2:24 pm today.     --Will follow patient during hospitalization--  Wyn Quaker RN, MSN, CDE Diabetes Coordinator Inpatient Glycemic Control Team Team Pager: [phone] (8a-5p)

## 2016-01-19 NOTE — ED Notes (Signed)
Main lab called about blood collection

## 2016-01-19 NOTE — ED Notes (Signed)
IV team unable to get IV or blood

## 2016-01-19 NOTE — H&P (Addendum)
TRH H&P   Patient Demographics:    [name], is a 50 y.o. female  MRN: [id number]   DOB - [birth year]  Admit Date - 01/19/2016  Outpatient Primary MD for the patient is [name], MD  Patient coming from: Home  Chief Complaint  Patient presents with  . Nausea  . Emesis      HPI:    [name]  is a 50 y.o. female, His medical history of insulin-dependent type 1 diabetes mellitus, essential hypertension, diabetic peripheral neuropathy, diabetic gastroparesis, GERD, dyslipidemia, depression, noncompliance with diabetic medications, diabetic retinopathy who has been frequently admitted to the hospital for DKA comes to the ER with 24-hour history of worsening of her chronic generalized abdominal pain along with nausea and vomiting, she claims that she missed her last few insulin doses by mistake, upon arrival to the ER her lab work and general presentation was consistent with DKA. I was called to admit the patient.  Patient currently denies any fevers, but does have positive chills and feeling cold in the room, denies any headache, no chest pain, no cough, no dysuria, she had nausea vomiting ongoing since yesterday, bowel movements are regular, no blood in her emesis, no new joint pains or aches skin rashes or bruises, no focal weakness.    Review of systems:    In addition to the HPI above,   No Fever-chills, No Headache, No changes with Vision or hearing, No problems swallowing food or Liquids, No Chest pain, Cough or Shortness of Breath, Chronic generalized Abdominal pain, was that of nausea and Vommitting, Bowel movements are regular, No Blood in stool or Urine, No dysuria, No new skin rashes or  bruises, No new joints pains-aches,  No new weakness, tingling, numbness in any extremity, No recent weight gain or loss, No polyuria, polydypsia or polyphagia, No significant Mental Stressors.  A full 10 point Review of Systems was done, except as stated above, all other Review of Systems were negative.   With Past History of the following :    Past Medical History:  Diagnosis Date  . Depression   . Diabetes mellitus without complication ([place])   . Essential hypertension   . Gastroparesis   . GERD (gastroesophageal reflux disease)   . HLD (hyperlipidemia)       Past Surgical History:  Procedure Laterality  Date  . COLONOSCOPY  09/27/2014   at [facility]  . ESOPHAGOGASTRODUODENOSCOPY  09/27/2014   at [facility], Dr Rolan Lipa. biospy neg for celiac, neg for H pylori.   . EYE SURGERY    . gailstones    . POSTERIOR VITRECTOMY AND MEMBRANE PEEL-LEFT EYE  09/28/2002  . POSTERIOR VITRECTOMY AND MEMBRANE PEEL-RIGHT EYE  03/16/2002  . RETINAL DETACHMENT SURGERY        Social History:     Social History  Substance Use Topics  . Smoking status: Never Smoker  . Smokeless tobacco: Never Used  . Alcohol use No        Family History :     Family History  Problem Relation Age of Onset  . Cystic fibrosis Mother   . Hypertension Father   . Diabetes Brother   . Hypertension Maternal Grandmother        Home Medications:   Prior to Admission medications   Medication Sig Start Date End Date Taking? Authorizing Provider  albuterol (PROVENTIL HFA;VENTOLIN HFA) 108 (90 Base) MCG/ACT inhaler Inhale 2 puffs into the lungs every 6 (six) hours as needed for wheezing or shortness of breath. 10/20/15   [name], MD  amLODipine (NORVASC) 2.5 MG tablet Take 1 tablet (2.5 mg total) by mouth daily. 12/01/15   Lavina Hamman, MD  atenolol (TENORMIN) 25 MG tablet Take 1 tablet (25 mg total) by mouth daily. 12/01/15   Lavina Hamman, MD  atorvastatin (LIPITOR) 20 MG tablet Take 1 tablet (20 mg  total) by mouth daily at 6 PM. 06/25/15   Allie Bossier, MD  Blood Glucose Monitoring Suppl (TRUE METRIX METER) DEVI 1 each by Does not apply route 3 (three) times daily before meals. 07/19/15   [name], MD  busPIRone (BUSPAR) 5 MG tablet Take 2 tablets (10 mg total) by mouth 3 (three) times daily. 12/01/15   Lavina Hamman, MD  cetirizine (ZYRTEC) 10 MG tablet Take 1 tablet (10 mg total) by mouth daily. 10/20/15   [name], MD  docusate sodium (COLACE) 100 MG capsule Take 1 capsule (100 mg total) by mouth 2 (two) times daily. 09/05/15   Modena Jansky, MD  feeding supplement, GLUCERNA SHAKE, (GLUCERNA SHAKE) LIQD Take 237 mLs by mouth 3 (three) times daily between meals. Patient not taking: Reported on 11/28/2015 09/05/15   Modena Jansky, MD  Ferrous Fumarate (HEMOCYTE - 106 MG FE) 324 (106 Fe) MG TABS tablet Take 1 tablet (106 mg of iron total) by mouth 2 (two) times daily. Patient taking differently: Take 1 tablet by mouth daily.  09/05/15   Modena Jansky, MD  FLUoxetine (PROZAC) 40 MG capsule Take 40 mg by mouth daily.    Historical Provider, MD  glucose blood (TRUE METRIX BLOOD GLUCOSE TEST) test strip Use 3 times daily before meals 07/19/15   [name], MD  insulin aspart (NOVOLOG) 100 UNIT/ML injection Inject 0-15 Units into the skin 3 (three) times daily with meals. Per sliding scale 10/25/15   [name], MD  insulin detemir (LEVEMIR) 100 UNIT/ML injection Inject 0.25 mLs (25 Units total) into the skin daily. 10/25/15   [name], MD  Insulin Syringe-Needle U-100 (BD INSULIN SYRINGE ULTRAFINE) 31G X 15/64" 0.5 ML MISC 1 each by Does not apply route 4 (four) times daily as needed. 08/16/15   [name], MD  metoCLOPramide (REGLAN) 10 MG tablet Take 1 tablet (10 mg total) by mouth 4 (four) times daily -  before meals and  at bedtime. 01/11/16   Waynetta Pean, PA-C  ondansetron (ZOFRAN ODT) 4 MG disintegrating tablet Take 1 tablet (4 mg total) by mouth every 8 (eight) hours as needed  for nausea. 11/16/15   Shanker Kristeen Mans, MD  pantoprazole (PROTONIX) 40 MG tablet Take 40 mg by mouth daily. Reported on 07/19/2015 01/04/15   Historical Provider, MD  polyethylene glycol powder (GLYCOLAX/MIRALAX) powder Take 17 g by mouth daily as needed for moderate constipation. Reported on 07/19/2015 04/06/15   Historical Provider, MD  pregabalin (LYRICA) 150 MG capsule Take 1 capsule (150 mg total) by mouth 2 (two) times daily. 10/25/15   [name], MD  TRUEPLUS LANCETS 28G MISC 1 each by Does not apply route 3 (three) times daily before meals. 07/19/15   [name], MD  vitamin B-12 (CYANOCOBALAMIN) 1000 MCG tablet Take 1 tablet (1,000 mcg total) by mouth daily. 12/01/15   Lavina Hamman, MD     Allergies:     Allergies  Allergen Reactions  . Anesthetics, Amide Nausea And Vomiting  . Penicillins Diarrhea and Nausea And Vomiting    Has patient had a PCN reaction causing immediate rash, facial/tongue/throat swelling, SOB or lightheadedness with hypotension: No Has patient had a PCN reaction causing severe rash involving mucus membranes or skin necrosis: No Has patient had a PCN reaction that required hospitalization No Has patient had a PCN reaction occurring within the last 10 years: Yes  If all of the above answers are "NO", then may proceed with Cephalosporin use.   . Buprenorphine Hcl Rash  . Encainide Nausea And Vomiting     Physical Exam:   Vitals  Blood pressure 140/76, pulse 106, temperature 98.4 F (36.9 C), resp. rate 18, last menstrual period 12/15/2015, SpO2 100 %.   1. General Middle-aged African-American female lying in bed in mild distress due to ongoing chills and feeling cold,  2. Normal affect and insight, Not Suicidal or Homicidal, Awake Alert, Oriented X 3.  3. No F.N deficits, ALL C.Nerves Intact, Strength 5/5 all 4 extremities, Sensation intact all 4 extremities, Plantars down going.  4. Ears and Eyes appear Normal, Conjunctivae clear, PERRLA. Moist Oral  Mucosa.  5. Supple Neck, No JVD, No cervical lymphadenopathy appriciated, No Carotid Bruits.  6. Symmetrical Chest wall movement, Good air movement bilaterally, CTAB.  7. RRR, No Gallops, Rubs or Murmurs, No Parasternal Heave.  8. Positive Bowel Sounds, Abdomen Soft, No tenderness, No organomegaly appriciated,No rebound -guarding or rigidity.  9.  No Cyanosis, Normal Skin Turgor, No Skin Rash or Bruise.  10. Good muscle tone,  joints appear normal , no effusions, Normal ROM.  11. No Palpable Lymph Nodes in Neck or Axillae      Data Review:    CBC  Recent Labs Lab 01/19/16 1151  WBC 12.3*  HGB 9.8*  HCT 32.9*  PLT 435*  MCV 76.2*  MCH 22.7*  MCHC 29.8*  RDW 16.9*  LYMPHSABS 0.9  MONOABS 0.2  EOSABS 0.0  BASOSABS 0.0   ------------------------------------------------------------------------------------------------------------------  Chemistries   Recent Labs Lab 01/19/16 1151  NA 133*  K 4.7  CL 98*  CO2 15*  GLUCOSE 620*  BUN 22*  CREATININE 1.38*  CALCIUM 9.6  MG 2.1  AST 24  ALT 17  ALKPHOS 114  BILITOT 1.3*   ------------------------------------------------------------------------------------------------------------------ CrCl cannot be calculated (Unknown ideal weight.). ------------------------------------------------------------------------------------------------------------------ No results for input(s): TSH, T4TOTAL, T3FREE, THYROIDAB in the last 72 hours.  Invalid input(s): FREET3  Coagulation profile No results for input(s): INR,  PROTIME in the last 168 hours. ------------------------------------------------------------------------------------------------------------------- No results for input(s): DDIMER in the last 72 hours. -------------------------------------------------------------------------------------------------------------------  Cardiac Enzymes No results for input(s): CKMB, TROPONINI, MYOGLOBIN in the last 168  hours.  Invalid input(s): CK ------------------------------------------------------------------------------------------------------------------ No results found for: BNP   ---------------------------------------------------------------------------------------------------------------  Urinalysis    Component Value Date/Time   COLORURINE YELLOW 01/19/2016 0922   APPEARANCEUR CLEAR 01/19/2016 0922   LABSPEC 1.024 01/19/2016 0922   PHURINE 5.5 01/19/2016 0922   GLUCOSEU >1000 (A) 01/19/2016 0922   HGBUR NEGATIVE 01/19/2016 0922   BILIRUBINUR NEGATIVE 01/19/2016 0922   BILIRUBINUR NEGATIVE 08/04/2012 1005   KETONESUR >80 (A) 01/19/2016 0922   PROTEINUR NEGATIVE 01/19/2016 0922   UROBILINOGEN 0.2 04/27/2015 1608   NITRITE NEGATIVE 01/19/2016 0922   LEUKOCYTESUR NEGATIVE 01/19/2016 0922    ----------------------------------------------------------------------------------------------------------------   Imaging Results:       Assessment & Plan:     1. DKA in a type I diabetic who is noncompliant with her insulin. Plan is to admit to stepdown, follow DKA protocol, IV fluid bolus provided in the ER, continue IV fluid maintenance along with insulin drip, will go ahead and give her home dose level here in the ER to bring down her insulin drip requirements. Monitor electrolytes closely. Insulin drip can be stopped once gap has closed and stays close to 2 hours as she is already getting her Levemir. Patient has been counseled on compliance. Last A1c is close to 11 recently.  Lab Results  Component Value Date   HGBA1C 11.1 (H) 11/28/2015   2.High anion gap metabolic acidosis due to #1 above. Causing compensate region tachypnea, supportive care as above. Bicarbonate is 15 she does not require any bicarbonate supplement at this time.  3. Hypertension hypertension. Continue home dose beta blocker for now.  4. Diabetic gastroparesis with nausea vomiting. Continue Reglan meal, Zofran when  necessary, check KUB to rule out any obstruction, abdominal exam is benign. Till nausea vomiting is better we'll keep on clears by mouth except medications. Once improved advance diet as tolerated.  5. Diabetic neuropathy. Once she is hydrated will resume her home medication Lyrica.  6. Dyslipidemia. Will resume home dose statin once nausea vomiting better.  7. Chills. Does not appear toxic at all, this is due to likely cold saline, will apply multiple blankets increase room temperature and monitor.  8. Mild leukocytosis. Reactionary due to #1 above, UA unremarkable, check baseline abdominal and chest x-ray. No cough no chest pain.  9. Anemia of chronic disease. Outpatient follow-up and monitoring by PCP. Expect mild dilutional fall with IV fluids.  10. GERD. Continue PPI oral.   DVT Prophylaxis Lovenox    AM Labs Ordered, also please review Full Orders  Family Communication: Admission, patients condition and plan of care including tests being ordered have been discussed with the patient  who indicates understanding and agree with the plan and Code Status.  Code Status Full  Likely DC to  Home in 1-2 days  Condition GUARDED     Consults called: None    Admission status: Inpt   Time spent in minutes : 35   SINGH,PRASHANT K M.D on 01/19/2016 at 2:41 PM  Between 7am to 7pm - Pager - [phone]. After 7pm go to [email or web address] - password [facility]  [facility] - Office  [phone]

## 2016-01-19 NOTE — ED Triage Notes (Signed)
Patient here with complaints of gastroparesis flare up. Nausea, vomiting that started at 5am his morning. Sugar 527.

## 2016-01-19 NOTE — ED Notes (Signed)
Bed: WA02 Expected date:  Expected time:  Means of arrival:  Comments: 41sF/n/v

## 2016-01-19 NOTE — ED Notes (Signed)
RN at bedside starting IV will collect labs 

## 2016-01-20 LAB — BASIC METABOLIC PANEL
ANION GAP: 8 (ref 5–15)
Anion gap: 10 (ref 5–15)
Anion gap: 7 (ref 5–15)
BUN: 21 mg/dL — ABNORMAL HIGH (ref 6–20)
BUN: 20 mg/dL (ref 6–20)
BUN: 18 mg/dL (ref 6–20)
CALCIUM: 8.2 mg/dL — AB (ref 8.9–10.3)
CALCIUM: 8.1 mg/dL — AB (ref 8.9–10.3)
CHLORIDE: 112 mmol/L — AB (ref 101–111)
CO2: 15 mmol/L — AB (ref 22–32)
CO2: 16 mmol/L — ABNORMAL LOW (ref 22–32)
CO2: 17 mmol/L — ABNORMAL LOW (ref 22–32)
CREATININE: 1.3 mg/dL — AB (ref 0.44–1.00)
Calcium: 7.9 mg/dL — ABNORMAL LOW (ref 8.9–10.3)
Chloride: 115 mmol/L — ABNORMAL HIGH (ref 101–111)
Chloride: 115 mmol/L — ABNORMAL HIGH (ref 101–111)
Creatinine, Ser: 1.39 mg/dL — ABNORMAL HIGH (ref 0.44–1.00)
Creatinine, Ser: 1.22 mg/dL — ABNORMAL HIGH (ref 0.44–1.00)
GFR calc non Af Amer: 48 mL/min — ABNORMAL LOW (ref >60)
GFR calc non Af Amer: 52 mL/min — ABNORMAL LOW (ref >60)
GFR, EST AFRICAN AMERICAN: 55 mL/min — AB (ref >60)
GFR, EST AFRICAN AMERICAN: 51 mL/min — AB (ref >60)
GFR, EST AFRICAN AMERICAN: 60 mL/min — AB (ref >60)
GFR, EST NON AFRICAN AMERICAN: 44 mL/min — AB (ref >60)
Glucose, Bld: 75 mg/dL (ref 65–99)
Glucose, Bld: 125 mg/dL — ABNORMAL HIGH (ref 65–99)
Glucose, Bld: 146 mg/dL — ABNORMAL HIGH (ref 65–99)
POTASSIUM: 3.7 mmol/L (ref 3.5–5.1)
Potassium: 4.5 mmol/L (ref 3.5–5.1)
Potassium: 4.1 mmol/L (ref 3.5–5.1)
SODIUM: 140 mmol/L (ref 135–145)
SODIUM: 136 mmol/L (ref 135–145)
Sodium: 139 mmol/L (ref 135–145)

## 2016-01-20 LAB — GLUCOSE, CAPILLARY
GLUCOSE-CAPILLARY: 251 mg/dL — AB (ref 65–99)
GLUCOSE-CAPILLARY: 209 mg/dL — AB (ref 65–99)
GLUCOSE-CAPILLARY: 171 mg/dL — AB (ref 65–99)
GLUCOSE-CAPILLARY: 145 mg/dL — AB (ref 65–99)
GLUCOSE-CAPILLARY: 90 mg/dL (ref 65–99)
Glucose-Capillary: 113 mg/dL — ABNORMAL HIGH (ref 65–99)
Glucose-Capillary: 123 mg/dL — ABNORMAL HIGH (ref 65–99)
Glucose-Capillary: 124 mg/dL — ABNORMAL HIGH (ref 65–99)
Glucose-Capillary: 137 mg/dL — ABNORMAL HIGH (ref 65–99)
Glucose-Capillary: 168 mg/dL — ABNORMAL HIGH (ref 65–99)
Glucose-Capillary: 184 mg/dL — ABNORMAL HIGH (ref 65–99)
Glucose-Capillary: 195 mg/dL — ABNORMAL HIGH (ref 65–99)
Glucose-Capillary: 81 mg/dL (ref 65–99)
Glucose-Capillary: 82 mg/dL (ref 65–99)

## 2016-01-20 LAB — HEMOGLOBIN A1C
Hgb A1c MFr Bld: 9.4 % — ABNORMAL HIGH (ref 4.8–5.6)
Mean Plasma Glucose: 223 mg/dL

## 2016-01-20 LAB — MAGNESIUM: MAGNESIUM: 1.8 mg/dL (ref 1.7–2.4)

## 2016-01-20 MED ORDER — INSULIN DETEMIR 100 UNIT/ML ~~LOC~~ SOLN
25.0000 [IU] | Freq: Every day | SUBCUTANEOUS | Status: DC
  Administered 2016-01-20 – 2016-01-21 (×2): 25 [IU] via SUBCUTANEOUS
  Filled 2016-01-20 (×2): qty 0.25

## 2016-01-20 MED ORDER — INSULIN ASPART 100 UNIT/ML ~~LOC~~ SOLN
0.0000 [IU] | Freq: Three times a day (TID) | SUBCUTANEOUS | Status: DC
Start: 2016-01-20 — End: 2016-01-20
  Administered 2016-01-20: 2 [IU] via SUBCUTANEOUS

## 2016-01-20 MED ORDER — SODIUM CHLORIDE 0.9 % IV BOLUS (SEPSIS)
1000.0000 mL | Freq: Once | INTRAVENOUS | Status: AC
  Administered 2016-01-20: 1000 mL via INTRAVENOUS

## 2016-01-20 MED ORDER — PREGABALIN 75 MG PO CAPS
150.0000 mg | ORAL_CAPSULE | Freq: Two times a day (BID) | ORAL | Status: DC
  Administered 2016-01-20 – 2016-01-21 (×3): 150 mg via ORAL
  Filled 2016-01-20 (×3): qty 2

## 2016-01-20 MED ORDER — INSULIN ASPART 100 UNIT/ML ~~LOC~~ SOLN
0.0000 [IU] | Freq: Every day | SUBCUTANEOUS | Status: DC
Start: 2016-01-20 — End: 2016-01-20

## 2016-01-20 MED ORDER — INSULIN ASPART 100 UNIT/ML ~~LOC~~ SOLN
3.0000 [IU] | Freq: Three times a day (TID) | SUBCUTANEOUS | Status: DC
  Administered 2016-01-20 (×2): 3 [IU] via SUBCUTANEOUS

## 2016-01-20 MED ORDER — ZOLPIDEM TARTRATE 5 MG PO TABS: 2.5000 mg | ORAL_TABLET | Freq: Every evening | ORAL | Status: DC | PRN

## 2016-01-20 MED ORDER — LACTATED RINGERS IV SOLN
INTRAVENOUS | Status: AC
  Administered 2016-01-20 (×2): via INTRAVENOUS

## 2016-01-20 MED ORDER — INSULIN ASPART 100 UNIT/ML ~~LOC~~ SOLN
0.0000 [IU] | Freq: Three times a day (TID) | SUBCUTANEOUS | Status: DC
  Administered 2016-01-21: 7 [IU] via SUBCUTANEOUS
  Administered 2016-01-21: 3 [IU] via SUBCUTANEOUS

## 2016-01-20 MED ORDER — METOCLOPRAMIDE HCL 5 MG/ML IJ SOLN
5.0000 mg | Freq: Three times a day (TID) | INTRAMUSCULAR | Status: DC
  Administered 2016-01-20 – 2016-01-21 (×3): 5 mg via INTRAVENOUS
  Filled 2016-01-20 (×6): qty 2

## 2016-01-20 NOTE — Hospital Discharge Follow-Up (Signed)
Transitional Care Clinic Care Coordination Note:  Admit date:  01/19/16 Discharge date:  TBD Discharge Disposition: Home when stable Patient contact: [phone] (mobile) Emergency contact(s): Bethann Punches (son)-#[phone]  Patient known to the [facility]. Patient has a history of type 1 diabetes mellitus, essential hypertension, diabetic peripheral neuropathy, depression, diabetic retinopathy. Admitted with DKA. Patient has had 5 inpatient admissions and 1 ED visit in the last six months. This Case Manager placed call to patient to remind her of the case management services and medical management that can be provided at the [facility]. Patient verbalized understanding and agreed to receive post-discharge care once again at the [facility].   Patient scheduled for Transitional Care appointment on 01/26/16 at [facility] with Dr. Jarold Song.  Clinic information and appointment time provided to patient. Appointment information also placed on AVS.  Assessment:       Home Environment: Patient currently living with her sister in a private residence       Support System: family members       Level of functioning: Independent       Home DME: none though patient does indicate she has a glucometer and diabetes monitoring supplies.       Home care services: none       Transportation: Patient indicates she plans to ask her son if he is able to provide transportation to her appointment; however, she is uncertain if he will be able to provide transportation. Patient may need transportation to her [facility] appointment if her son is unable to take her to her appointment. Transitional Care Case Manager will follow-up with patient after discharge to determine if transportation needed to upcoming appointment.        Food/Nutrition: Patient indicates she has access to needed food.        Medications: Patient gets her medications  from the pharmacy at [facility]. Patient denies problems affording or obtaining needed medications. Patient indicates she has been experiencing a lot of anxiety lately, and she indicated she experienced a panic attack yesterday. Inquired if patient mentioned this to her RN or Hospitalist, and patient indicated she had not informed her RN or Hospitalist.  This Case Manager placed call to [place], patient's bedside RN, and provided update.         Identified Barriers: possible transportation needs, noncompliance with insulin, uninsured-Patient indicates she has met with a Development worker, community in the past and was provided a list of the documents she needed to obtain for Financial Assistance application; however, she indicated she lost this information.  Informed patient she would benefit from meeting with Financial Counselor at [facility] once again so appointment scheduled for 01/26/16 at 37 with Financial Counselor. Patient appreciative of appointment.        PCP: Dr. Zenaida [facility]:        Services communicated to/with: This Case Manager received communication from Livia Snellen, RN CM that patient was hospitalized. This Case Manager placed call to Velva Harman, RN CM to inform her that patient is agreeable to post-discharge follow-up with the [facility]. Unable to reach Velva Harman; voicemail left requesting return call.

## 2016-01-20 NOTE — Progress Notes (Addendum)
PROGRESS NOTE                                                                                                                                                                                                             Patient Demographics:    [name], is a 50 y.o. female, DOB - [birth year], [id number]  Admit date - 01/19/2016   Admitting Physician Thurnell Lose, MD  Outpatient Primary MD for the patient is [name], MD  LOS - 1  Chief Complaint  Patient presents with  . Nausea  . Emesis       Brief Narrative    [name]  is a 50 y.o. female, His medical history of insulin-dependent type 1 diabetes mellitus, essential hypertension, diabetic peripheral neuropathy, diabetic gastroparesis, GERD, dyslipidemia, depression, noncompliance with diabetic medications, diabetic retinopathy who has been frequently admitted to the hospital for DKA comes to the ER with 24-hour history of worsening of her chronic generalized abdominal pain along with nausea and vomiting, she claims that she missed her last few insulin doses by mistake, upon arrival to the ER her lab work and general presentation was consistent with DKA. I was called to admit the patient.  Patient currently denies any fevers, but does have positive chills and feeling cold in the room, denies any headache, no chest pain, no cough, no dysuria, she had nausea vomiting ongoing since yesterday, bowel movements are regular, no blood in her emesis, no new joint pains or aches skin rashes or bruises, no focal weakness.    Subjective:    [name] today has, No headache, No chest pain, No abdominal pain - No Nausea, No new weakness tingling or numbness, No Cough - SOB. She feels much better this morning.   Assessment  & Plan :     1. DKA in a type I diabetic who is noncompliant with her insulin. DKA protocol him a anion gap is closed, unfortunately  Levemir dose was slightly delayed and will be given this morning, stop insulin drip. Hours after Levemir has been provided, place on pre-meal NovoLog and sliding scale, clinically looks mildly dehydrated so will hydrate another 24 hours and discharge in the morning instead of today.  Lab Results  Component Value Date   HGBA1C 9.4 (H) 01/19/2016   CBG (last 3)   Recent Labs  01/20/16 0421 01/20/16 0536 01/20/16 0647  GLUCAP 123* 168* 195*     2.High anion gap metabolic acidosis due to #1 above. Anion gap has closed, bicarbonate is still low will switch from normal saline to LR and monitor.  3. Hypertension . Continue home dose beta blocker for now.  4. Diabetic gastroparesis with nausea vomiting. Continue Reglan meal, Zofran when necessary, KUB was unremarkable she is now symptom-free will advance diet.  5. Diabetic neuropathy. On Lyrica.  6. GERD. Continue PPI oral..  7. Mild leukocytosis. Reactionary due to #1 above, UA unremarkable, abdominal and chest x-ray unremarkable. No cough no chest pain.  8. Anemia of chronic disease. Outpatient follow-up and monitoring by PCP. Marland Kitchen  9. Anxiety. On Xanax.  10. Mild ARF on CK D stage II - hydrate, hold ACE and monitor.     Family Communication  :  none  Code Status :  Full  Diet : Carb Modified  Disposition Plan  :  Home in am  Consults  :  none  Procedures  :    DVT Prophylaxis  :  Lovenox    Lab Results  Component Value Date   PLT 435 (H) 01/19/2016    Inpatient Medications  Scheduled Meds: . atenolol  25 mg Oral Daily  . busPIRone  10 mg Oral TID  . enoxaparin (LOVENOX) injection  40 mg Subcutaneous Q24H  . FLUoxetine  40 mg Oral Daily  . insulin aspart  0-15 Units Subcutaneous TID WC  . insulin aspart  0-5 Units Subcutaneous QHS  . insulin aspart  3 Units Subcutaneous TID WC  . insulin detemir  25 Units Subcutaneous Daily  . metoCLOPramide (REGLAN) injection  5 mg Intravenous TID AC  .  pantoprazole  40 mg Oral Daily   Continuous Infusions: . lactated ringers 75 mL/hr at 01/20/16 0840   PRN Meds:.albuterol, zolpidem  Antibiotics  :    Anti-infectives    None         Objective:   Vitals:   01/20/16 0700 01/20/16 0748 01/20/16 0800 01/20/16 0900  BP: (!) 125/56  132/64   Pulse:      Resp: (!) 29  17 12   Temp:  98.2 F (36.8 C)    TempSrc:  Oral    SpO2: 100%  100%   Weight:    62.1 kg (136 lb 14.5 oz)  Height:    5\' 2"  (1.575 m)    Wt Readings from Last 3 Encounters:  01/20/16 62.1 kg (136 lb 14.5 oz)  11/29/15 62.6 kg (138 lb 0.1 oz)  11/15/15 64.6 kg (142 lb 6.7 oz)     Intake/Output Summary (Last 24 hours) at 01/20/16 0948 Last data filed at 01/20/16 0600  Gross per 24 hour  Intake          2428.03 ml  Output                0 ml  Net          2428.03 ml     Physical Exam  Awake Alert, Oriented X 3, No new F.N deficits, Normal affect [place].AT,PERRAL Supple Neck,No JVD, No cervical lymphadenopathy appriciated.  Symmetrical Chest wall movement, Good air movement bilaterally, CTAB RRR,No Gallops,Rubs or new Murmurs, No Parasternal Heave +ve B.Sounds, Abd Soft, No tenderness, No organomegaly appriciated, No rebound - guarding or rigidity. No Cyanosis, Clubbing or edema, No new Rash or bruise       Data Review:    CBC  Recent  Labs Lab 01/19/16 1151  WBC 12.3*  HGB 9.8*  HCT 32.9*  PLT 435*  MCV 76.2*  MCH 22.7*  MCHC 29.8*  RDW 16.9*  LYMPHSABS 0.9  MONOABS 0.2  EOSABS 0.0  BASOSABS 0.0    Chemistries   Recent Labs Lab 01/19/16 1151 01/19/16 1626 01/19/16 2028 01/20/16 0149 01/20/16 0412  NA 133* 140 137 140 139  K 4.7 4.8 4.8 4.5 4.1  CL 98* 114* 115* 115* 115*  CO2 15* 12* 13* 15* 16*  GLUCOSE 620* 377* 245* 75 125*  BUN 22* 22* 19 21* 20  CREATININE 1.38* 1.25* 1.13* 1.30* 1.39*  CALCIUM 9.6 8.6* 8.6* 8.2* 8.1*  MG 2.1  --   --   --  1.8  AST 24  --   --   --   --   ALT 17  --   --   --   --   ALKPHOS 114   --   --   --   --   BILITOT 1.3*  --   --   --   --    ------------------------------------------------------------------------------------------------------------------ No results for input(s): CHOL, HDL, LDLCALC, TRIG, CHOLHDL, LDLDIRECT in the last 72 hours.  Lab Results  Component Value Date   HGBA1C 9.4 (H) 01/19/2016   ------------------------------------------------------------------------------------------------------------------ No results for input(s): TSH, T4TOTAL, T3FREE, THYROIDAB in the last 72 hours.  Invalid input(s): FREET3 ------------------------------------------------------------------------------------------------------------------ No results for input(s): VITAMINB12, FOLATE, FERRITIN, TIBC, IRON, RETICCTPCT in the last 72 hours.  Coagulation profile No results for input(s): INR, PROTIME in the last 168 hours.  No results for input(s): DDIMER in the last 72 hours.  Cardiac Enzymes No results for input(s): CKMB, TROPONINI, MYOGLOBIN in the last 168 hours.  Invalid input(s): CK ------------------------------------------------------------------------------------------------------------------ No results found for: BNP  Micro Results Recent Results (from the past 240 hour(s))  MRSA PCR Screening     Status: None   Collection Time: 01/19/16  3:50 PM  Result Value Ref Range Status   MRSA by PCR NEGATIVE NEGATIVE Final    Comment:        The GeneXpert MRSA Assay (FDA approved for NASAL specimens only), is one component of a comprehensive MRSA colonization surveillance program. It is not intended to diagnose MRSA infection nor to guide or monitor treatment for MRSA infections.     Radiology Reports Dg Abd Acute W/chest  Result Date: 01/19/2016 CLINICAL DATA:  Cyclical vomiting and nausea. EXAM: DG ABDOMEN ACUTE W/ 1V CHEST COMPARISON:  Radiographs dated 11/15/2015 and 07/29/2015 from pump FINDINGS: There is minimal air in the bowel. No appreciable  dilated bowel. Surgical clips in the right upper quadrant consistent with previous cholecystectomy. No free air. Bones are normal. IMPRESSION: Benign-appearing abdomen and chest. Electronically Signed   By: Lorriane Shire M.D.   On: 01/19/2016 15:35   Time Spent in minutes  30   Ileene Allie K M.D on 01/20/2016 at 9:48 AM  Between 7am to 7pm - Pager - [phone]  After 7pm go to [email or web address] - password [facility]  [facility] -  Office  [phone]

## 2016-01-20 NOTE — Progress Notes (Signed)
Spoke with Dr Candiss Norse regarding patient getting lunch and dinner tray within 2 hours.  Patient had received meal coverage but did not need sliding scale at lunch. Dr Candiss Norse did not want patient covered at dinner but wants patient covered at bedtime.

## 2016-01-20 NOTE — Progress Notes (Signed)
Date:  January 20, 2016 Chart reviewed for concurrent status and case management needs. Will continue to follow the patient for changes and needs: dka and iv insulin drip Expected discharge date: [id number] Velva Harman, [place], [place], [place]

## 2016-01-21 LAB — GLUCOSE, CAPILLARY
GLUCOSE-CAPILLARY: 336 mg/dL — AB (ref 65–99)
Glucose-Capillary: 204 mg/dL — ABNORMAL HIGH (ref 65–99)
Glucose-Capillary: 201 mg/dL — ABNORMAL HIGH (ref 65–99)

## 2016-01-21 LAB — BASIC METABOLIC PANEL
ANION GAP: 5 (ref 5–15)
BUN: 15 mg/dL (ref 6–20)
CHLORIDE: 110 mmol/L (ref 101–111)
CO2: 20 mmol/L — ABNORMAL LOW (ref 22–32)
Calcium: 8.4 mg/dL — ABNORMAL LOW (ref 8.9–10.3)
Creatinine, Ser: 1.11 mg/dL — ABNORMAL HIGH (ref 0.44–1.00)
GFR, EST NON AFRICAN AMERICAN: 58 mL/min — AB (ref >60)
Glucose, Bld: 255 mg/dL — ABNORMAL HIGH (ref 65–99)
POTASSIUM: 4 mmol/L (ref 3.5–5.1)
Sodium: 135 mmol/L (ref 135–145)

## 2016-01-21 MED ORDER — INSULIN ASPART 100 UNIT/ML ~~LOC~~ SOLN
4.0000 [IU] | Freq: Three times a day (TID) | SUBCUTANEOUS | Status: DC
  Administered 2016-01-21 (×2): 4 [IU] via SUBCUTANEOUS

## 2016-01-21 MED ORDER — ONDANSETRON 4 MG PO TBDP: 4.0000 mg | ORAL_TABLET | Freq: Three times a day (TID) | ORAL | 0 refills | Status: DC | PRN

## 2016-01-21 NOTE — Progress Notes (Signed)
CSW spoke with pt regarding transportation concerns. Pt stated she needed a taxi voucher because her family was unable to pick her up. CSW asked pt if she would use a bus pass and pt declined but stated she had her own money to pay for a taxi. CSW did not give her a taxi voucher but gave nurse bus passes in case she changes her mind. No further needs from CSW.   Kingsley Spittle, [place] Clinical Social Worker [phone]

## 2016-01-21 NOTE — Discharge Instructions (Signed)
Follow with Primary MD [name], MD in 3-4 days   Get CBC, CMP, 2 view Chest X ray checked  by Primary MD or SNF MD in 5-7 days ( we routinely change or add medications that can affect your baseline labs and fluid status, therefore we recommend that you get the mentioned basic workup next visit with your PCP, your PCP may decide not to get them or add new tests based on their clinical decision)   Activity: As tolerated with Full fall precautions use walker/cane & assistance as needed   Disposition Home     Diet:   Heart Healthy Low Carb.  Accuchecks 4 times/day, Once in AM empty stomach and then before each meal. Log in all results and show them to your Prim.MD in 3 days. If any glucose reading is under 80 or above 300 call your Prim MD immidiately. Follow Low glucose instructions for glucose under 80 as instructed.   For Heart failure patients - Check your Weight same time everyday, if you gain over 2 pounds, or you develop in leg swelling, experience more shortness of breath or chest pain, call your Primary MD immediately. Follow Cardiac Low Salt Diet and 1.5 lit/day fluid restriction.   On your next visit with your primary care physician please Get Medicines reviewed and adjusted.   Please request your Prim.MD to go over all Hospital Tests and Procedure/Radiological results at the follow up, please get all Hospital records sent to your Prim MD by signing hospital release before you go home.   If you experience worsening of your admission symptoms, develop shortness of breath, life threatening emergency, suicidal or homicidal thoughts you must seek medical attention immediately by calling 911 or calling your MD immediately  if symptoms less severe.  You Must read complete instructions/literature along with all the possible adverse reactions/side effects for all the Medicines you take and that have been prescribed to you. Take any new Medicines after you have completely understood  and accpet all the possible adverse reactions/side effects.   Do not drive, operate heavy machinery, perform activities at heights, swimming or participation in water activities or provide baby sitting services if your were admitted for syncope or siezures until you have seen by Primary MD or a Neurologist and advised to do so again.  Do not drive when taking Pain medications.    Do not take more than prescribed Pain, Sleep and Anxiety Medications  Special Instructions: If you have smoked or chewed Tobacco  in the last 2 yrs please stop smoking, stop any regular Alcohol  and or any Recreational drug use.  Wear Seat belts while driving.   Please note  You were cared for by a hospitalist during your hospital stay. If you have any questions about your discharge medications or the care you received while you were in the hospital after you are discharged, you can call the unit and asked to speak with the hospitalist on call if the hospitalist that took care of you is not available. Once you are discharged, your primary care physician will handle any further medical issues. Please note that NO REFILLS for any discharge medications will be authorized once you are discharged, as it is imperative that you return to your primary care physician (or establish a relationship with a primary care physician if you do not have one) for your aftercare needs so that they can reassess your need for medications and monitor your lab values.

## 2016-01-21 NOTE — Progress Notes (Signed)
Discharge instructions discussed with patient. Prescriptions were given to patient. Medications were discussed and no further questions were present.

## 2016-01-21 NOTE — Discharge Summary (Signed)
Shantanique Gambell [id number] DOB: August 11, 1965 DOA: 01/19/2016  PCP: [name], MD  Admit date: 01/19/2016  Discharge date: 01/21/2016  Admitted From: Home   Disposition:  Home   Recommendations for Outpatient Follow-up:   Follow up with PCP in 1-2 weeks  PCP Please obtain BMP/CBC, 2 view CXR in 1week,  (see Discharge instructions)   PCP Please follow up on the following pending results: None   Home Health: None   Equipment/Devices: None  Consultations: Non3 Discharge Condition: Stable   CODE STATUS: Full   Diet Recommendation: Heart Healthy - Low Carb   Chief Complaint  Patient presents with  . Nausea  . Emesis     Brief history of present illness from the day of admission and additional interim summary    ChevelThomasis a 50 y.o.female,His medical history of insulin-dependent type 1 diabetes mellitus, essential hypertension, diabetic peripheral neuropathy, diabetic gastroparesis, GERD, dyslipidemia, depression, noncompliance with diabetic medications, diabetic retinopathy who has been frequently admitted to the hospital for DKA comes to the ER with 24-hour history of worsening of her chronic generalized abdominal pain along with nausea and vomiting, she claims that she missed her last few insulin doses by mistake, upon arrival to the ER her lab work and general presentation was consistent with DKA. I was called to admit the patient.  Patient currently denies any fevers, but does have positive chills and feeling cold in the room, denies any headache, no chest pain, no cough, no dysuria, she had nausea vomiting ongoing since yesterday, bowel movements are regular, no blood in her emesis, no new joint pains or aches skin rashes or bruises, no focal weakness.   Hospital issues addressed     1. DKA  in a type I diabetic who is noncompliant with her insulin. Resolved after IV fluids and IV insulin per DKA protocol, resumed on Levemir and sliding scale, answered on compliance, patient requested to do CBGs every before meals and at bedtime and maintenance log book and show it to PCP next visit.  Lab Results  Component Value Date   HGBA1C 9.4 (H) 01/19/2016    2.High anion gap metabolic acidosis due to #1 above. Resolved after hydration and DKA treatment.  3.Hypertension . Continue home regimen.  4.Diabetic gastroparesis with nausea vomiting. Continue Reglan meal, Zofran when necessary, KUB was unremarkable she is now symptom-free and has tolerated diet for over 24 hours.  5.Diabetic neuropathy. On Lyrica.  6. GERD. Continue PPI oral..  7.Mild leukocytosis. Reactionary due to #1 above, UA unremarkable, abdominal and chest x-ray unremarkable. No cough no chest pain.  8.Anemia of chronic disease. Outpatient follow-up and monitoring by PCP. Marland Kitchen  9. Anxiety. On Xanax.  10. Mild ARF on CK D stage II - resolved after IV fluids at [facility] P[facility] to monitor BMP next visit.     Discharge diagnosis     Principal Problem:   Diabetic ketoacidosis without coma associated with type 1 diabetes mellitus (HCC) Active Problems:   High anion gap metabolic acidosis   Diabetic neuropathy, type  I diabetes mellitus ([place])   DKA (diabetic ketoacidoses) (HCC)   Anemia, iron deficiency   HLD (hyperlipidemia)   GERD (gastroesophageal reflux disease)    Discharge instructions    Discharge Instructions    Discharge instructions    Complete by:  As directed   Follow with Primary MD [name], MD in 3-4 days   Get CBC, CMP, 2 view Chest X ray checked  by Primary MD or SNF MD in 5-7 days ( we routinely change or add medications that can affect your baseline labs and fluid status, therefore we recommend that you get the mentioned basic workup next visit with your PCP, your PCP may decide not  to get them or add new tests based on their clinical decision)   Activity: As tolerated with Full fall precautions use walker/cane & assistance as needed   Disposition Home     Diet:   Heart Healthy Low Carb.  Accuchecks 4 times/day, Once in AM empty stomach and then before each meal. Log in all results and show them to your Prim.MD in 3 days. If any glucose reading is under 80 or above 300 call your Prim MD immidiately. Follow Low glucose instructions for glucose under 80 as instructed.   For Heart failure patients - Check your Weight same time everyday, if you gain over 2 pounds, or you develop in leg swelling, experience more shortness of breath or chest pain, call your Primary MD immediately. Follow Cardiac Low Salt Diet and 1.5 lit/day fluid restriction.   On your next visit with your primary care physician please Get Medicines reviewed and adjusted.   Please request your Prim.MD to go over all Hospital Tests and Procedure/Radiological results at the follow up, please get all Hospital records sent to your Prim MD by signing hospital release before you go home.   If you experience worsening of your admission symptoms, develop shortness of breath, life threatening emergency, suicidal or homicidal thoughts you must seek medical attention immediately by calling 911 or calling your MD immediately  if symptoms less severe.  You Must read complete instructions/literature along with all the possible adverse reactions/side effects for all the Medicines you take and that have been prescribed to you. Take any new Medicines after you have completely understood and accpet all the possible adverse reactions/side effects.   Do not drive, operate heavy machinery, perform activities at heights, swimming or participation in water activities or provide baby sitting services if your were admitted for syncope or siezures until you have seen by Primary MD or a Neurologist and advised to do so  again.  Do not drive when taking Pain medications.    Do not take more than prescribed Pain, Sleep and Anxiety Medications  Special Instructions: If you have smoked or chewed Tobacco  in the last 2 yrs please stop smoking, stop any regular Alcohol  and or any Recreational drug use.  Wear Seat belts while driving.   Please note  You were cared for by a hospitalist during your hospital stay. If you have any questions about your discharge medications or the care you received while you were in the hospital after you are discharged, you can call the unit and asked to speak with the hospitalist on call if the hospitalist that took care of you is not available. Once you are discharged, your primary care physician will handle any further medical issues. Please note that NO REFILLS for any discharge medications will be authorized once you are discharged, as  it is imperative that you return to your primary care physician (or establish a relationship with a primary care physician if you do not have one) for your aftercare needs so that they can reassess your need for medications and monitor your lab values.   Increase activity slowly    Complete by:  As directed      Discharge Medications     Medication List    TAKE these medications   albuterol 108 (90 Base) MCG/ACT inhaler Commonly known as:  PROVENTIL HFA;VENTOLIN HFA Inhale 2 puffs into the lungs every 6 (six) hours as needed for wheezing or shortness of breath.   ALPRAZolam 0.25 MG tablet Commonly known as:  XANAX Take 0.25 mg by mouth 2 (two) times daily as needed for anxiety.   glucose blood test strip Commonly known as:  TRUE METRIX BLOOD GLUCOSE TEST Use 3 times daily before meals   insulin aspart 100 UNIT/ML injection Commonly known as:  novoLOG Inject 0-15 Units into the skin 3 (three) times daily with meals. Per sliding scale   insulin detemir 100 UNIT/ML injection Commonly known as:  LEVEMIR Inject 0.25 mLs (25 Units  total) into the skin daily. What changed:  how much to take  when to take this   Insulin Syringe-Needle U-100 31G X 15/64" 0.5 ML Misc Commonly known as:  BD INSULIN SYRINGE ULTRAFINE 1 each by Does not apply route 4 (four) times daily as needed.   lisinopril 20 MG tablet Commonly known as:  PRINIVIL,ZESTRIL Take 20 mg by mouth daily.   metoCLOPramide 10 MG tablet Commonly known as:  REGLAN Take 1 tablet (10 mg total) by mouth 4 (four) times daily -  before meals and at bedtime.   ondansetron 4 MG disintegrating tablet Commonly known as:  ZOFRAN ODT Take 1 tablet (4 mg total) by mouth every 8 (eight) hours as needed for nausea.   polyethylene glycol powder powder Commonly known as:  GLYCOLAX/MIRALAX Take 1 Container by mouth daily. Reported on 07/19/2015   pregabalin 150 MG capsule Commonly known as:  LYRICA Take 1 capsule (150 mg total) by mouth 2 (two) times daily.   TRUE METRIX METER Devi 1 each by Does not apply route 3 (three) times daily before meals.   TRUEPLUS LANCETS 28G Misc 1 each by Does not apply route 3 (three) times daily before meals.       Follow-up [facility] Follow up on 01/26/2016.   Why:  [facility] appointment on 01/26/16 at 9:45 am with Dr. Jarold Song. Financial Counselor appointment on 01/26/16 at 10:30 am.  Contact information: [address] [place] [phone]          Major procedures and Radiology Reports - PLEASE review detailed and final reports thoroughly  -        Dg Abd Acute W/chest  Result Date: 01/19/2016 CLINICAL DATA:  Cyclical vomiting and nausea. EXAM: DG ABDOMEN ACUTE W/ 1V CHEST COMPARISON:  Radiographs dated 11/15/2015 and 07/29/2015 from pump FINDINGS: There is minimal air in the bowel. No appreciable dilated bowel. Surgical clips in the right upper quadrant consistent with previous cholecystectomy. No free air. Bones are normal.  IMPRESSION: Benign-appearing abdomen and chest. Electronically Signed   By: Lorriane Shire M.D.   On: 01/19/2016 15:35   Micro Results     Recent Results (from the past 240 hour(s))  MRSA PCR Screening     Status: None  Collection Time: 01/19/16  3:50 PM  Result Value Ref Range Status   MRSA by PCR NEGATIVE NEGATIVE Final    Comment:        The GeneXpert MRSA Assay (FDA approved for NASAL specimens only), is one component of a comprehensive MRSA colonization surveillance program. It is not intended to diagnose MRSA infection nor to guide or monitor treatment for MRSA infections.     Today   Subjective    Alisha Cumba today has no headache,no chest abdominal pain,no new weakness tingling or numbness, feels much better wants to go home today.    Objective   Blood pressure 133/66, pulse 79, temperature 98.2 F (36.8 C), temperature source Oral, resp. rate 16, height 5\' 2"  (1.575 m), weight 62.1 kg (136 lb 14.5 oz), last menstrual period 12/15/2015, SpO2 99 %.   Intake/Output Summary (Last 24 hours) at 01/21/16 0854 Last data filed at 01/21/16 0600  Gross per 24 hour  Intake          1238.44 ml  Output                0 ml  Net          1238.44 ml    Exam Awake Alert, Oriented x 3, No new F.N deficits, Normal affect [place].AT,PERRAL Supple Neck,No JVD, No cervical lymphadenopathy appriciated.  Symmetrical Chest wall movement, Good air movement bilaterally, CTAB RRR,No Gallops,Rubs or new Murmurs, No Parasternal Heave +ve B.Sounds, Abd Soft, Non tender, No organomegaly appriciated, No rebound -guarding or rigidity. No Cyanosis, Clubbing or edema, No new Rash or bruise   Data Review   CBC w Diff: Lab Results  Component Value Date   WBC 12.3 (H) 01/19/2016   HGB 9.8 (L) 01/19/2016   HCT 32.9 (L) 01/19/2016   PLT 435 (H) 01/19/2016   LYMPHOPCT 7 01/19/2016   MONOPCT 2 01/19/2016   EOSPCT 0 01/19/2016   BASOPCT 0 01/19/2016    CMP: Lab Results  Component Value  Date   NA 135 01/21/2016   K 4.0 01/21/2016   CL 110 01/21/2016   CO2 20 (L) 01/21/2016   BUN 15 01/21/2016   CREATININE 1.11 (H) 01/21/2016   PROT 8.2 (H) 01/19/2016   ALBUMIN 4.3 01/19/2016   BILITOT 1.3 (H) 01/19/2016   ALKPHOS 114 01/19/2016   AST 24 01/19/2016   ALT 17 01/19/2016  .   Total Time in preparing paper work, data evaluation and todays exam - 35 minutes  Thurnell Lose M.D on 01/21/2016 at 8:54 AM  [facility]   Office  [phone]

## 2016-01-23 ENCOUNTER — Telehealth: Payer: Self-pay

## 2016-01-23 ENCOUNTER — Inpatient Hospital Stay (HOSPITAL_COMMUNITY)
Admission: EM | Admit: 2016-01-23 | Discharge: 2016-01-26 | DRG: 638 | Disposition: A | Discharge status: Home / Self Care | Payer: Self-pay | Attending: Internal Medicine | Admitting: Internal Medicine

## 2016-01-23 ENCOUNTER — Emergency Department (HOSPITAL_COMMUNITY): Admission: EM | Admit: 2016-01-23 | Discharge: 2016-01-23 | Discharge status: Against Medical Advice | Payer: 59

## 2016-01-23 DIAGNOSIS — D509 Iron deficiency anemia, unspecified: Secondary | ICD-10-CM | POA: Diagnosis present

## 2016-01-23 DIAGNOSIS — E1110 Type 2 diabetes mellitus with ketoacidosis without coma: Secondary | ICD-10-CM | POA: Diagnosis present

## 2016-01-23 DIAGNOSIS — Z833 Family history of diabetes mellitus: Secondary | ICD-10-CM

## 2016-01-23 DIAGNOSIS — Z794 Long term (current) use of insulin: Secondary | ICD-10-CM

## 2016-01-23 DIAGNOSIS — D72829 Elevated white blood cell count, unspecified: Secondary | ICD-10-CM | POA: Diagnosis present

## 2016-01-23 DIAGNOSIS — F419 Anxiety disorder, unspecified: Secondary | ICD-10-CM

## 2016-01-23 DIAGNOSIS — E871 Hypo-osmolality and hyponatremia: Secondary | ICD-10-CM | POA: Diagnosis present

## 2016-01-23 DIAGNOSIS — E785 Hyperlipidemia, unspecified: Secondary | ICD-10-CM | POA: Diagnosis present

## 2016-01-23 DIAGNOSIS — Z8249 Family history of ischemic heart disease and other diseases of the circulatory system: Secondary | ICD-10-CM

## 2016-01-23 DIAGNOSIS — Z79899 Other long term (current) drug therapy: Secondary | ICD-10-CM

## 2016-01-23 DIAGNOSIS — E872 Acidosis: Secondary | ICD-10-CM | POA: Diagnosis present

## 2016-01-23 DIAGNOSIS — Z9049 Acquired absence of other specified parts of digestive tract: Secondary | ICD-10-CM

## 2016-01-23 DIAGNOSIS — F410 Panic disorder [episodic paroxysmal anxiety] without agoraphobia: Secondary | ICD-10-CM | POA: Diagnosis present

## 2016-01-23 DIAGNOSIS — F329 Major depressive disorder, single episode, unspecified: Secondary | ICD-10-CM | POA: Diagnosis present

## 2016-01-23 DIAGNOSIS — R112 Nausea with vomiting, unspecified: Secondary | ICD-10-CM | POA: Diagnosis present

## 2016-01-23 DIAGNOSIS — K219 Gastro-esophageal reflux disease without esophagitis: Secondary | ICD-10-CM | POA: Diagnosis present

## 2016-01-23 DIAGNOSIS — F32A Depression, unspecified: Secondary | ICD-10-CM | POA: Diagnosis present

## 2016-01-23 DIAGNOSIS — Z9114 Patient's other noncompliance with medication regimen: Secondary | ICD-10-CM

## 2016-01-23 DIAGNOSIS — E8729 Other acidosis: Secondary | ICD-10-CM | POA: Diagnosis present

## 2016-01-23 DIAGNOSIS — K3184 Gastroparesis: Secondary | ICD-10-CM | POA: Diagnosis present

## 2016-01-23 DIAGNOSIS — E1043 Type 1 diabetes mellitus with diabetic autonomic (poly)neuropathy: Secondary | ICD-10-CM | POA: Diagnosis present

## 2016-01-23 DIAGNOSIS — N179 Acute kidney failure, unspecified: Secondary | ICD-10-CM | POA: Diagnosis present

## 2016-01-23 DIAGNOSIS — E1010 Type 1 diabetes mellitus with ketoacidosis without coma: Principal | ICD-10-CM | POA: Diagnosis present

## 2016-01-23 DIAGNOSIS — Z9119 Patient's noncompliance with other medical treatment and regimen: Secondary | ICD-10-CM

## 2016-01-23 DIAGNOSIS — E1143 Type 2 diabetes mellitus with diabetic autonomic (poly)neuropathy: Secondary | ICD-10-CM | POA: Diagnosis present

## 2016-01-23 DIAGNOSIS — I10 Essential (primary) hypertension: Secondary | ICD-10-CM | POA: Diagnosis present

## 2016-01-23 NOTE — ED Triage Notes (Signed)
Patient here from home with complaints of nausea, vomiting. Hyperglycemia. CBG 379. Seen at [facility] earlier today for hypoglycemia.

## 2016-01-23 NOTE — ED Notes (Signed)
Bed: WA01 Expected date:  Expected time:  Means of arrival:  Comments: EMS hyperglycemia 357 diaphoretic and tachycardic

## 2016-01-23 NOTE — Telephone Encounter (Signed)
[facility] Post-discharge Follow-Up Phone Call:  Date of Discharge: 01/21/2016 Principal Discharge Diagnosis(es): DKA, diabetic gastroparesis, diabetic neuropathy, anxiety Post-discharge Communication: (Clearly document all attempts clearly and date contact made) Call placed to the patient Call Completed: Yes                    With Whom: Patient Interpreter Needed:No     Please check all that apply:  X  Patient is knowledgeable of his/her condition(s) and/or treatment. X  Patient is caring for self at home.  - She is caring for herself but is living with her sister. She said that her sister works.  ? Patient is receiving assist at home from family and/or caregiver. Family and/or caregiver is knowledgeable of patient's condition(s) and/or treatment. ? Patient is receiving home health services. If so, name of agency.     Medication Reconciliation:  ? Medication list reviewed with patient. X  Patient obtained all discharge medications. If not, why? The patient said that she has all of her medications. No changes noted on the discharge summary. She noted that she will need to talk to Dr Jarold Song about another prescription for xanax.  She was feeling nauseous when this CM called and review of the list was deferred. The patient is to bring all of her medications with her to her appointment with Dr Jarold Song on 01/26/16 for review. She said that her blood sugar this morning was 126 and last night it was 121.  She stated that doesn't always log her blood sugars.  Encouraged her to try to log all results for the doctor to review at her appointments.    Activities of Daily Living:  X  Independent - She said that she does not use an assistive device for ambulation.  ? Needs assist (describe; ? home DME used) ? [facility] (describe, ? home DME used)   Community resources in place for patient:  X  None  ? Home Health/Home DME ? Assisted Living ? Support Group          Patient Education:  At the time of the call, the patient stated taht she is " not feeling good." her voice was very quiet and she spoke slowly. She stated that she has not felt nauseous since she came home from the hospital on Saturday until about 2 hours ago. She is now experiencing nausea stated that she took zofran about an hour ago. She noted that she has not had any vomiting and has not eaten this morning  She said that she experiences anxiety but had no other complaints to report. Reviewed with her that the zofran is ordered for 4 mg every 8 hours as needed. This CM offered to schedule her an earlier  appointment with Dr Jarold Song before 01/26/16, possibly tomorrow,  but she refused. She said that she wanted to keep the appointment for 01/26/16. Discussed the need to return to the ED if she is not having any relief of the nausea when taking the zofran as ordered or if the symptoms she was experiencing when she was admitted to the hospital re-occur. She stated that she understood but did not report any need to seek medical attention at this time. Also informed her to contact the [facility] if she has any questions/concerns.         Questions/Concerns discussed: Confirmed the patient's appointment for 01/26/16 @ 0945.  She said that she will need transportation to the clinic.  Jacklynn Lewis, [place] notified  of the patient's need for transportation.   Update provided to Dr Jarold Song with an in basket message.

## 2016-01-24 ENCOUNTER — Inpatient Hospital Stay (HOSPITAL_COMMUNITY): Payer: Self-pay

## 2016-01-24 ENCOUNTER — Encounter (HOSPITAL_COMMUNITY): Payer: Self-pay | Admitting: Emergency Medicine

## 2016-01-24 DIAGNOSIS — D72829 Elevated white blood cell count, unspecified: Secondary | ICD-10-CM | POA: Diagnosis present

## 2016-01-24 DIAGNOSIS — I10 Essential (primary) hypertension: Secondary | ICD-10-CM | POA: Diagnosis present

## 2016-01-24 HISTORY — PX: IR GENERIC HISTORICAL: IMG1180011

## 2016-01-24 LAB — BASIC METABOLIC PANEL
ANION GAP: 17 — AB (ref 5–15)
ANION GAP: 12 (ref 5–15)
ANION GAP: 9 (ref 5–15)
ANION GAP: 6 (ref 5–15)
Anion gap: 25 — ABNORMAL HIGH (ref 5–15)
BUN: 17 mg/dL (ref 6–20)
BUN: 14 mg/dL (ref 6–20)
BUN: 11 mg/dL (ref 6–20)
BUN: 10 mg/dL (ref 6–20)
BUN: 9 mg/dL (ref 6–20)
CALCIUM: 9.5 mg/dL (ref 8.9–10.3)
CALCIUM: 8.8 mg/dL — AB (ref 8.9–10.3)
CO2: 10 mmol/L — AB (ref 22–32)
CO2: 12 mmol/L — AB (ref 22–32)
CO2: 14 mmol/L — AB (ref 22–32)
CO2: 18 mmol/L — AB (ref 22–32)
CO2: 20 mmol/L — AB (ref 22–32)
Calcium: 8.9 mg/dL (ref 8.9–10.3)
Calcium: 8.6 mg/dL — ABNORMAL LOW (ref 8.9–10.3)
Calcium: 8.2 mg/dL — ABNORMAL LOW (ref 8.9–10.3)
Chloride: 100 mmol/L — ABNORMAL LOW (ref 101–111)
Chloride: 114 mmol/L — ABNORMAL HIGH (ref 101–111)
Chloride: 116 mmol/L — ABNORMAL HIGH (ref 101–111)
Chloride: 113 mmol/L — ABNORMAL HIGH (ref 101–111)
Chloride: 112 mmol/L — ABNORMAL HIGH (ref 101–111)
Creatinine, Ser: 1.26 mg/dL — ABNORMAL HIGH (ref 0.44–1.00)
Creatinine, Ser: 1.11 mg/dL — ABNORMAL HIGH (ref 0.44–1.00)
Creatinine, Ser: 0.97 mg/dL (ref 0.44–1.00)
Creatinine, Ser: 0.88 mg/dL (ref 0.44–1.00)
Creatinine, Ser: 0.91 mg/dL (ref 0.44–1.00)
GFR calc Af Amer: 57 mL/min — ABNORMAL LOW (ref >60)
GFR calc Af Amer: >60 mL/min (ref >60)
GFR calc Af Amer: >60 mL/min (ref >60)
GFR calc Af Amer: >60 mL/min (ref >60)
GFR calc Af Amer: >60 mL/min (ref >60)
GFR calc non Af Amer: 58 mL/min — ABNORMAL LOW (ref >60)
GFR calc non Af Amer: >60 mL/min (ref >60)
GFR calc non Af Amer: >60 mL/min (ref >60)
GFR, EST NON AFRICAN AMERICAN: 50 mL/min — AB (ref >60)
GLUCOSE: 581 mg/dL — AB (ref 65–99)
GLUCOSE: 272 mg/dL — AB (ref 65–99)
GLUCOSE: 208 mg/dL — AB (ref 65–99)
GLUCOSE: 138 mg/dL — AB (ref 65–99)
GLUCOSE: 166 mg/dL — AB (ref 65–99)
POTASSIUM: 4 mmol/L (ref 3.5–5.1)
POTASSIUM: 3.7 mmol/L (ref 3.5–5.1)
POTASSIUM: 3.6 mmol/L (ref 3.5–5.1)
POTASSIUM: 3.3 mmol/L — AB (ref 3.5–5.1)
Potassium: 3.9 mmol/L (ref 3.5–5.1)
Sodium: 135 mmol/L (ref 135–145)
Sodium: 143 mmol/L (ref 135–145)
Sodium: 142 mmol/L (ref 135–145)
Sodium: 140 mmol/L (ref 135–145)
Sodium: 138 mmol/L (ref 135–145)

## 2016-01-24 LAB — GLUCOSE, CAPILLARY
GLUCOSE-CAPILLARY: 260 mg/dL — AB (ref 65–99)
GLUCOSE-CAPILLARY: 219 mg/dL — AB (ref 65–99)
GLUCOSE-CAPILLARY: 206 mg/dL — AB (ref 65–99)
GLUCOSE-CAPILLARY: 138 mg/dL — AB (ref 65–99)
GLUCOSE-CAPILLARY: 144 mg/dL — AB (ref 65–99)
GLUCOSE-CAPILLARY: 169 mg/dL — AB (ref 65–99)
Glucose-Capillary: 201 mg/dL — ABNORMAL HIGH (ref 65–99)
Glucose-Capillary: 172 mg/dL — ABNORMAL HIGH (ref 65–99)
Glucose-Capillary: 128 mg/dL — ABNORMAL HIGH (ref 65–99)
Glucose-Capillary: 130 mg/dL — ABNORMAL HIGH (ref 65–99)

## 2016-01-24 LAB — URINE MICROSCOPIC-ADD ON

## 2016-01-24 LAB — BLOOD GAS, ARTERIAL
Acid-base deficit: 15.8 mmol/L — ABNORMAL HIGH (ref 0.0–2.0)
Bicarbonate: 10.2 mEq/L — ABNORMAL LOW (ref 20.0–24.0)
DRAWN BY: 422461
FIO2: 0.21
O2 Saturation: 96.2 %
PCO2 ART: 25.2 mmHg — AB (ref 35.0–45.0)
PH ART: 7.232 — AB (ref 7.350–7.450)
PO2 ART: 114 mmHg — AB (ref 80.0–100.0)
Patient temperature: 98.5
TCO2: 10 mmol/L (ref 0–100)

## 2016-01-24 LAB — CBC
HEMATOCRIT: 32.4 % — AB (ref 36.0–46.0)
HEMOGLOBIN: 9.9 g/dL — AB (ref 12.0–15.0)
MCH: 22.7 pg — AB (ref 26.0–34.0)
MCHC: 30.6 g/dL (ref 30.0–36.0)
MCV: 74.1 fL — ABNORMAL LOW (ref 78.0–100.0)
Platelets: 421 10*3/uL — ABNORMAL HIGH (ref 150–400)
RBC: 4.37 MIL/uL (ref 3.87–5.11)
RDW: 16.7 % — AB (ref 11.5–15.5)
WBC: 14 10*3/uL — ABNORMAL HIGH (ref 4.0–10.5)

## 2016-01-24 LAB — URINALYSIS, ROUTINE W REFLEX MICROSCOPIC
BILIRUBIN URINE: NEGATIVE
Glucose, UA: >1000 mg/dL — AB
Leukocytes, UA: NEGATIVE
NITRITE: NEGATIVE
PH: 6 (ref 5.0–8.0)
Protein, ur: NEGATIVE mg/dL
SPECIFIC GRAVITY, URINE: 1.02 (ref 1.005–1.030)

## 2016-01-24 LAB — CBG MONITORING, ED
GLUCOSE-CAPILLARY: 420 mg/dL — AB (ref 65–99)
GLUCOSE-CAPILLARY: 380 mg/dL — AB (ref 65–99)
Glucose-Capillary: 492 mg/dL — ABNORMAL HIGH (ref 65–99)
Glucose-Capillary: 570 mg/dL (ref 65–99)

## 2016-01-24 LAB — MAGNESIUM: Magnesium: 1.8 mg/dL (ref 1.7–2.4)

## 2016-01-24 LAB — PHOSPHORUS: Phosphorus: 2 mg/dL — ABNORMAL LOW (ref 2.5–4.6)

## 2016-01-24 LAB — MRSA PCR SCREENING: MRSA by PCR: NEGATIVE

## 2016-01-24 MED ORDER — POTASSIUM CHLORIDE 10 MEQ/100ML IV SOLN
10.0000 meq | INTRAVENOUS | Status: AC
  Administered 2016-01-24 (×2): 10 meq via INTRAVENOUS
  Filled 2016-01-24: qty 100

## 2016-01-24 MED ORDER — SODIUM CHLORIDE 0.9 % IV SOLN: INTRAVENOUS | Status: AC

## 2016-01-24 MED ORDER — AMLODIPINE BESYLATE 5 MG PO TABS
2.5000 mg | ORAL_TABLET | Freq: Every day | ORAL | Status: DC
  Administered 2016-01-24 – 2016-01-26 (×3): 2.5 mg via ORAL
  Filled 2016-01-24 (×3): qty 1

## 2016-01-24 MED ORDER — INSULIN REGULAR HUMAN 100 UNIT/ML IJ SOLN
INTRAMUSCULAR | Status: DC
  Administered 2016-01-24: 5.1 [IU]/h via INTRAVENOUS
  Filled 2016-01-24: qty 2.5

## 2016-01-24 MED ORDER — METOCLOPRAMIDE HCL 10 MG PO TABS
10.0000 mg | ORAL_TABLET | Freq: Three times a day (TID) | ORAL | Status: DC
  Administered 2016-01-24 – 2016-01-26 (×11): 10 mg via ORAL
  Filled 2016-01-24 (×12): qty 1

## 2016-01-24 MED ORDER — ALPRAZOLAM 0.25 MG PO TABS
0.2500 mg | ORAL_TABLET | Freq: Two times a day (BID) | ORAL | Status: DC | PRN
  Administered 2016-01-24 – 2016-01-26 (×2): 0.25 mg via ORAL
  Filled 2016-01-24 (×2): qty 1

## 2016-01-24 MED ORDER — ALBUTEROL SULFATE (2.5 MG/3ML) 0.083% IN NEBU: 2.5000 mg | INHALATION_SOLUTION | Freq: Four times a day (QID) | RESPIRATORY_TRACT | Status: DC | PRN

## 2016-01-24 MED ORDER — LISINOPRIL 10 MG PO TABS: 10.0000 mg | ORAL_TABLET | Freq: Every day | ORAL | Status: DC

## 2016-01-24 MED ORDER — SODIUM CHLORIDE 0.9 % IV BOLUS (SEPSIS)
1000.0000 mL | Freq: Once | INTRAVENOUS | Status: AC
  Administered 2016-01-24: 1000 mL via INTRAVENOUS

## 2016-01-24 MED ORDER — INSULIN DETEMIR 100 UNIT/ML ~~LOC~~ SOLN
25.0000 [IU] | Freq: Every day | SUBCUTANEOUS | Status: DC
  Administered 2016-01-24: 25 [IU] via SUBCUTANEOUS
  Filled 2016-01-24: qty 0.25

## 2016-01-24 MED ORDER — ALBUTEROL SULFATE HFA 108 (90 BASE) MCG/ACT IN AERS: 2.0000 | INHALATION_SPRAY | Freq: Four times a day (QID) | RESPIRATORY_TRACT | Status: DC | PRN

## 2016-01-24 MED ORDER — DEXTROSE-NACL 5-0.45 % IV SOLN
INTRAVENOUS | Status: DC
Start: 2016-01-24 — End: 2016-01-24

## 2016-01-24 MED ORDER — INSULIN DETEMIR 100 UNIT/ML ~~LOC~~ SOLN: 25.0000 [IU] | Freq: Every day | SUBCUTANEOUS | Status: DC

## 2016-01-24 MED ORDER — INSULIN DETEMIR 100 UNIT/ML ~~LOC~~ SOLN
20.0000 [IU] | Freq: Every day | SUBCUTANEOUS | Status: DC
  Administered 2016-01-24: 20 [IU] via SUBCUTANEOUS
  Filled 2016-01-24 (×2): qty 0.2

## 2016-01-24 MED ORDER — DEXTROSE-NACL 5-0.45 % IV SOLN
INTRAVENOUS | Status: DC
  Administered 2016-01-24 – 2016-01-25 (×3): via INTRAVENOUS

## 2016-01-24 MED ORDER — ATENOLOL 25 MG PO TABS
25.0000 mg | ORAL_TABLET | Freq: Every day | ORAL | Status: DC
Start: 2016-01-24 — End: 2016-01-26
  Administered 2016-01-24 – 2016-01-26 (×3): 25 mg via ORAL
  Filled 2016-01-24 (×3): qty 1

## 2016-01-24 MED ORDER — SODIUM CHLORIDE 0.9 % IV SOLN
INTRAVENOUS | Status: DC
  Administered 2016-01-24: 07:00:00 via INTRAVENOUS

## 2016-01-24 MED ORDER — PROCHLORPERAZINE EDISYLATE 5 MG/ML IJ SOLN
10.0000 mg | INTRAMUSCULAR | Status: DC | PRN
  Administered 2016-01-24: 10 mg via INTRAVENOUS
  Filled 2016-01-24: qty 2

## 2016-01-24 MED ORDER — ENOXAPARIN SODIUM 40 MG/0.4ML ~~LOC~~ SOLN
40.0000 mg | SUBCUTANEOUS | Status: DC
  Administered 2016-01-24 – 2016-01-26 (×3): 40 mg via SUBCUTANEOUS
  Filled 2016-01-24 (×3): qty 0.4

## 2016-01-24 MED ORDER — HYDRALAZINE HCL 20 MG/ML IJ SOLN
5.0000 mg | Freq: Four times a day (QID) | INTRAMUSCULAR | Status: DC | PRN
  Filled 2016-01-24: qty 1

## 2016-01-24 MED ORDER — BUSPIRONE HCL 5 MG PO TABS
5.0000 mg | ORAL_TABLET | Freq: Three times a day (TID) | ORAL | Status: DC
  Administered 2016-01-24 – 2016-01-26 (×8): 5 mg via ORAL
  Filled 2016-01-24 (×9): qty 1

## 2016-01-24 MED ORDER — SODIUM CHLORIDE 0.9 % IV SOLN: INTRAVENOUS | Status: DC

## 2016-01-24 MED ORDER — LORAZEPAM 2 MG/ML IJ SOLN
1.0000 mg | Freq: Once | INTRAMUSCULAR | Status: AC
  Administered 2016-01-24: 1 mg via INTRAVENOUS
  Filled 2016-01-24: qty 1

## 2016-01-24 MED ORDER — MORPHINE SULFATE (PF) 2 MG/ML IV SOLN
2.0000 mg | INTRAVENOUS | Status: DC | PRN
  Administered 2016-01-24: 2 mg via INTRAVENOUS
  Filled 2016-01-24 (×2): qty 1

## 2016-01-24 MED ORDER — CHLOROPROCAINE HCL 1 % IJ SOLN
30.0000 mL | Freq: Once | INTRAMUSCULAR | Status: AC
  Administered 2016-01-24: 2 mL
  Filled 2016-01-24: qty 30

## 2016-01-24 MED ORDER — METOCLOPRAMIDE HCL 5 MG/ML IJ SOLN
10.0000 mg | Freq: Once | INTRAMUSCULAR | Status: AC
  Administered 2016-01-24: 10 mg via INTRAVENOUS
  Filled 2016-01-24: qty 2

## 2016-01-24 NOTE — Progress Notes (Signed)
[facility] PROGRESS NOTE    Progress Note  [name]  [id number] DOB: 15-Feb-1966 DOA: 01/23/2016 PCP: [name], MD     Brief Narrative:   [name] is an 50 y.o. female past medical history of diabetes type 2 gastroparesis is coming with DKA.  Assessment/Plan:   Diabetic ketoacidosis witDKA. hout coma associated with type 1 diabetes mellitus (HCC)/High anion gap metabolic acidosis - Probably due to noncompliance, before this admission she had aerated visited the ED once. - We'll need to continue insulin drip until bicarbonate is greater than 20 and anion gap closed. - She was started on IV insulin, IV fluids. -Her last A1c was 9.4. - Continue IV normal saline. - We'll ask IR to place PICC line. - She does not appear to have any signs of infection  Nausea & vomiting due to gastroparesis: Repeated admissions for this, her previous EKG on 720 showed a QTC of 471 Start Reglan. Repeat EKG today.  Anxiety and depression:  Hyponatremia: Likely prerenal in etiology continue IV fluids.  AKI (acute kidney injury) ([place]): Hold ACE inhibitor due to acute kidney injury.  Anemia, iron deficiency: Will need further follow-up as outpatient with PCP.  HLD (hyperlipidemia): Cont statins  Essential hypertension: Actually high continue Norvasc use hydralazine when necessary for systolic blood pressure greater than 180.    Leukocytosis: Unclear etiology of her leukocytosis, she has remained febrile   DVT prophylaxis: lovenox Family Communication:none Disposition Plan/Barrier to D/C: lovenox Code Status:     Code Status Orders        Start     Ordered   01/24/16 0702  Full code  Continuous     01/24/16 0701    Code Status History    Date Active Date Inactive Code Status Order ID Comments User Context   01/19/2016  2:23 PM 01/21/2016  5:38 PM Full Code [id number]  Thurnell Lose, MD ED   11/27/2015 10:01 PM 12/01/2015  6:39 PM Full Code [id number]   Toy Baker, MD Inpatient   11/14/2015  4:05 AM 11/16/2015  7:31 PM Full Code [id number]  Ivor Costa, MD ED   09/02/2015 12:33 PM 09/05/2015 10:12 PM Full Code [id number]  Kelvin Cellar, MD ED   07/29/2015  7:55 AM 08/01/2015 11:03 PM Full Code [id number]  Reyne Dumas, MD ED   06/29/2015  8:27 PM 07/03/2015  5:58 PM Full Code [id number]  Robbie Lis, MD Inpatient   06/22/2015 10:58 AM 06/25/2015  6:47 PM Full Code [id number]  Radene Gunning, NP Inpatient   06/14/2015  6:29 PM 06/20/2015  9:40 PM Full Code [id number]  Domenic Polite, MD Inpatient   05/26/2015 10:02 PM 06/05/2015  7:29 PM Full Code [id number]  Reubin Milan, MD Inpatient   05/23/2015  7:17 AM 05/24/2015  6:19 PM Full Code [id number]  Norval Morton, MD Inpatient   05/17/2015 12:21 PM 05/20/2015  6:13 PM Full Code [id number]  Eugenie Filler, MD Inpatient   05/17/2015 11:09 AM 05/17/2015 12:21 PM Full Code [id number]  Eugenie Filler, MD ED   04/19/2015  2:38 AM 04/20/2015 10:29 PM Full Code [id number]  Rise Patience, MD Inpatient   04/05/2015  5:43 AM 04/09/2015  7:25 PM Full Code [id number]  Rise Patience, MD Inpatient   04/05/2015  5:37 AM 04/05/2015  5:43 AM Full Code [id number]  Despina Hick, RN Inpatient   03/09/2015  7:42 AM 03/12/2015  7:11 PM Full Code [id number]  Theodis Blaze, MD Inpatient  02/20/2015  1:08 AM 02/22/2015  7:08 PM Full Code [id number]  Toy Baker, MD Inpatient   02/08/2015 11:43 PM 02/13/2015  3:32 PM Full Code [id number]  Lavina Hamman, MD ED   10/21/2014 12:57 AM 10/23/2014  3:14 PM Full Code [id number]  Rise Patience, MD Inpatient   02/12/2013 11:16 PM 02/17/2013  9:22 PM Full Code [id number]  Quintella Baton, MD Inpatient   11/25/2012  1:48 PM 11/28/2012  7:04 PM Full Code [id number]  Elmarie Shiley, MD ED   11/05/2012  5:37 PM 11/07/2012  4:20 PM Full Code [id number]  Sheila Oats, MD Inpatient   04/03/2012  2:27 AM 04/05/2012  4:43 PM Full Code [id number]  Quintella Baton, MD Inpatient   03/24/2012  12:21 AM 03/28/2012  5:51 PM Full Code [id number]  Leeroy Cha, RN ED        IV Access:    Peripheral IV   Procedures and diagnostic studies:   No results found.   Medical Consultants:    None.  Anti-Infectives:   None Subjective:    [name] she continues complain of nausea and is dry heaving.  Objective:    Vitals:   01/24/16 0430 01/24/16 0500 01/24/16 0530 01/24/16 0600  BP: 148/58 154/72 151/66 162/76  Pulse: 112 113 114 111  Resp: (!) 32 26 20 (!) 30  Temp:      TempSrc:      SpO2: 100% 100% 100% 100%  Weight:      Height:       No intake or output data in the 24 hours ending 01/24/16 0709 Filed Weights   01/24/16 0023  Weight: 65.8 kg (145 lb)    Exam: General exam: In no acute distress. Respiratory system: Good air movement and clear to auscultation. Cardiovascular system: S1 & S2 heard, RRR. Gastrointestinal system: Abdomen is nondistended, soft and nontender.  Extremities: No pedal edema. Skin: No rashes, lesions or ulcers Psychiatry: Judgement and insight appear normal. Mood & affect appropriate.    Data Reviewed:    Labs: Basic Metabolic Panel:  Recent Labs Lab 01/19/16 1151  01/20/16 0149 01/20/16 0412 01/20/16 1054 01/21/16 0454 01/24/16 0153  NA 133*  < > 140 139 136 135 135  K 4.7  < > 4.5 4.1 3.7 4.0 3.9  CL 98*  < > 115* 115* 112* 110 100*  CO2 15*  < > 15* 16* 17* 20* 10*  GLUCOSE 620*  < > 75 125* 146* 255* 581*  BUN 22*  < > 21* 20 18 15 17   CREATININE 1.38*  < > 1.30* 1.39* 1.22* 1.11* 1.26*  CALCIUM 9.6  < > 8.2* 8.1* 7.9* 8.4* 9.5  MG 2.1  --   --  1.8  --   --   --   < > = values in this interval not displayed. GFR Estimated Creatinine Clearance: 48.6 mL/min (by C-G formula based on SCr of 1.26 mg/dL). Liver Function Tests:  Recent Labs Lab 01/19/16 1151  AST 24  ALT 17  ALKPHOS 114  BILITOT 1.3*  PROT 8.2*  ALBUMIN 4.3   No results for input(s): LIPASE, AMYLASE in the last 168  hours. No results for input(s): AMMONIA in the last 168 hours. Coagulation profile No results for input(s): INR, PROTIME in the last 168 hours.  CBC:  Recent Labs Lab 01/19/16 1151 01/24/16 0153  WBC 12.3* 14.0*  NEUTROABS 11.2*  --   HGB 9.8* 9.9*  HCT 32.9* 32.4*  MCV  76.2* 74.1*  PLT 435* 421*   Cardiac Enzymes: No results for input(s): CKTOTAL, CKMB, CKMBINDEX, TROPONINI in the last 168 hours. BNP (last 3 results) No results for input(s): PROBNP in the last 8760 hours. CBG:  Recent Labs Lab 01/21/16 1245 01/24/16 0016 01/24/16 0322 01/24/16 0434 01/24/16 0556  GLUCAP 201* 492* 570* 420* 380*   D-Dimer: No results for input(s): DDIMER in the last 72 hours. Hgb A1c: No results for input(s): HGBA1C in the last 72 hours. Lipid Profile: No results for input(s): CHOL, HDL, LDLCALC, TRIG, CHOLHDL, LDLDIRECT in the last 72 hours. Thyroid function studies: No results for input(s): TSH, T4TOTAL, T3FREE, THYROIDAB in the last 72 hours.  Invalid input(s): FREET3 Anemia work up: No results for input(s): VITAMINB12, FOLATE, FERRITIN, TIBC, IRON, RETICCTPCT in the last 72 hours. Sepsis Labs:  Recent Labs Lab 01/19/16 1151 01/24/16 0153  WBC 12.3* 14.0*   Microbiology Recent Results (from the past 240 hour(s))  MRSA PCR Screening     Status: None   Collection Time: 01/19/16  3:50 PM  Result Value Ref Range Status   MRSA by PCR NEGATIVE NEGATIVE Final    Comment:        The GeneXpert MRSA Assay (FDA approved for NASAL specimens only), is one component of a comprehensive MRSA colonization surveillance program. It is not intended to diagnose MRSA infection nor to guide or monitor treatment for MRSA infections.      Medications:   . amLODipine  2.5 mg Oral Daily  . atenolol  25 mg Oral Daily  . busPIRone  5 mg Oral TID  . enoxaparin (LOVENOX) injection  40 mg Subcutaneous Q24H  . lisinopril  10 mg Oral Daily  . potassium chloride  10 mEq Intravenous Q1H    Continuous Infusions: . sodium chloride    . sodium chloride    . dextrose 5 % and 0.45% NaCl    . insulin (NOVOLIN-R) infusion      Time spent: 25 min   LOS: 0 days   Charlynne Cousins  [facility] Pager [phone]  *Please refer to [email or web address], password [email or web address] to get updated schedule on who will round on this patient, as hospitalists switch teams weekly. If 7PM-7AM, please contact night-coverage at [email or web address], password [email or web address] for any overnight needs.  01/24/2016, 7:09 AM

## 2016-01-24 NOTE — ED Triage Notes (Addendum)
Pt was dc from [facility] with hyperglyemia  Pt is actively vomiting and in pain of 10 out 10. Pt appears to have had a iv in her neck from other hospital.

## 2016-01-24 NOTE — Progress Notes (Signed)
Spoke with primary RN regarding PICC insertion.  Informed her that Interventional Radiology inserts her PICC lines due to scarring and inability of IV Team to insert PICCs as a result.

## 2016-01-24 NOTE — Hospital Discharge Follow-Up (Signed)
The patient is known to the [facility] at the [facility]. Her next appointment is scheduled for  01/31/16 @ 1000 and she also has an appointment with the [facility] Financial Counselor the same day at 1100. The information has been placed on the AVS.  Will follow her hospital course.

## 2016-01-24 NOTE — ED Notes (Signed)
Timer started 20 mins calling floor

## 2016-01-24 NOTE — Progress Notes (Signed)
MEDICATION RELATED CONSULT NOTE   IR Procedure Consult - Anticoagulant/Antiplatelet PTA/Inpatient Med List Review by Pharmacist    Procedure: PICC placed    Completed: 8/1  Post-Procedural bleeding risk per IR MD assessment:    Antithrombotic medications on inpatient or PTA profile prior to procedure:   Lovenox 40mg  daily    Recommended restart time per IR Post-Procedure Guidelines:    at least 4 hours or at next standard dose interval          Plan:     Continue Lovenox 40mg  daily as scheduled with next dose due at 0800 8/2.  Thanks Dorrene German 01/24/2016 11:05 PM

## 2016-01-24 NOTE — ED Notes (Signed)
Critical glucose of 581

## 2016-01-24 NOTE — ED Notes (Signed)
cbg 5:58am  Is 380

## 2016-01-24 NOTE — ED Provider Notes (Signed)
[facility] DEPT Provider Note   CSN: [id number] Arrival date & time: 01/23/16  2340  First Provider Contact:  First MD Initiated Contact with Patient 01/24/16 0250        History   Chief Complaint Chief Complaint  Patient presents with  . Hyperglycemia    HPI [name] is a 50 y.o. female.  Patient well known to the emergency department with history of uncontrolled diabetes, medication noncompliance, DKA, presenting tonight with progressively worsening nausea and vomiting and generalized body aches since yesterday. She reports being seen at [facility] last evening (01/23/16) and was discharged home from the ED. She comes here for further evaluation and management of worsening symptoms. No fever, SOB, CP, urinary symptoms.    The history is provided by the patient. No language interpreter was used.    Past Medical History:  Diagnosis Date  . Depression   . Diabetes mellitus without complication ([place])   . Essential hypertension   . Gastroparesis   . GERD (gastroesophageal reflux disease)   . HLD (hyperlipidemia)     Patient Active Problem List   Diagnosis Date Noted  . Prolonged QT interval 11/27/2015  . Abnormal ECG 11/27/2015  . Diabetes mellitus without complication ([employer])   . HLD (hyperlipidemia)   . GERD (gastroesophageal reflux disease)   . Gastroparesis   . Metabolic acidosis, normal anion gap (NAG)   . AKI (acute kidney injury) ([place])   . Anemia, iron deficiency   . DKA, type 1, not at goal [facility])   . DKA (diabetic ketoacidoses) ([place]) 09/02/2015  . Hyponatremia 09/02/2015  . Vitamin B12 deficiency 08/16/2015  . Lactic acidosis 07/29/2015  . Acute pyelonephritis 07/29/2015  . Pyelonephritis   . Hypothermia   . Diabetic gastroparesis ([place])   . Diabetic neuropathy, type I diabetes mellitus ([place]) 05/18/2015  . Anemia 05/18/2015  . Anxiety and depression 05/18/2015  . Nausea & vomiting   . Diabetic ketoacidosis without coma associated with  type 1 diabetes mellitus ([place])   . High anion gap metabolic acidosis [id number]    Past Surgical History:  Procedure Laterality Date  . COLONOSCOPY  09/27/2014   at [facility]  . ESOPHAGOGASTRODUODENOSCOPY  09/27/2014   at [facility], Dr Rolan Lipa. biospy neg for celiac, neg for H pylori.   . EYE SURGERY    . gailstones    . POSTERIOR VITRECTOMY AND MEMBRANE PEEL-LEFT EYE  09/28/2002  . POSTERIOR VITRECTOMY AND MEMBRANE PEEL-RIGHT EYE  03/16/2002  . RETINAL DETACHMENT SURGERY      OB History    No data available       Home Medications    Prior to Admission medications   Medication Sig Start Date End Date Taking? Authorizing Provider  albuterol (PROVENTIL HFA;VENTOLIN HFA) 108 (90 Base) MCG/ACT inhaler Inhale 2 puffs into the lungs every 6 (six) hours as needed for wheezing or shortness of breath. 10/20/15   [name], MD  ALPRAZolam (XANAX) 0.25 MG tablet Take 0.25 mg by mouth 2 (two) times daily as needed for anxiety.     Historical Provider, MD  Blood Glucose Monitoring Suppl (TRUE METRIX METER) DEVI 1 each by Does not apply route 3 (three) times daily before meals. 07/19/15   [name], MD  glucose blood (TRUE METRIX BLOOD GLUCOSE TEST) test strip Use 3 times daily before meals 07/19/15   [name], MD  insulin aspart (NOVOLOG) 100 UNIT/ML injection Inject 0-15 Units into the skin 3 (three) times daily with meals. Per sliding scale 10/25/15  [name], MD  insulin detemir (LEVEMIR) 100 UNIT/ML injection Inject 0.25 mLs (25 Units total) into the skin daily. Patient taking differently: Inject 20 Units into the skin at bedtime.  10/25/15   [name], MD  Insulin Syringe-Needle U-100 (BD INSULIN SYRINGE ULTRAFINE) 31G X 15/64" 0.5 ML MISC 1 each by Does not apply route 4 (four) times daily as needed. 08/16/15   [name], MD  lisinopril (PRINIVIL,ZESTRIL) 20 MG tablet Take 20 mg by mouth daily.    Historical Provider, MD  metoCLOPramide (REGLAN) 10 MG tablet Take 1 tablet (10 mg  total) by mouth 4 (four) times daily -  before meals and at bedtime. 01/11/16   Waynetta Pean, PA-C  ondansetron (ZOFRAN ODT) 4 MG disintegrating tablet Take 1 tablet (4 mg total) by mouth every 8 (eight) hours as needed for nausea. 01/21/16   Thurnell Lose, MD  polyethylene glycol powder (GLYCOLAX/MIRALAX) powder Take 1 Container by mouth daily. Reported on 07/19/2015 04/06/15   Historical Provider, MD  pregabalin (LYRICA) 150 MG capsule Take 1 capsule (150 mg total) by mouth 2 (two) times daily. 10/25/15   [name], MD  TRUEPLUS LANCETS 28G MISC 1 each by Does not apply route 3 (three) times daily before meals. 07/19/15   [name], MD    Family History Family History  Problem Relation Age of Onset  . Cystic fibrosis Mother   . Hypertension Father   . Diabetes Brother   . Hypertension Maternal Grandmother     Social History Social History  Substance Use Topics  . Smoking status: Never Smoker  . Smokeless tobacco: Never Used  . Alcohol use No     Allergies   Anesthetics, amide; Penicillins; Buprenorphine hcl; and Encainide   Review of Systems Review of Systems  Constitutional: Negative for chills and fever.  HENT: Negative.   Respiratory: Negative.   Cardiovascular: Negative.   Gastrointestinal: Positive for nausea and vomiting.  Genitourinary: Negative.   Musculoskeletal: Positive for myalgias.  Skin: Negative.   Neurological: Negative.      Physical Exam Updated Vital Signs BP 169/95 (BP Location: Right Arm) Comment: trending up ( 188/142) active and moaning   Pulse 113   Temp 98.4 F (36.9 C) (Oral)   Resp 26   Ht 5\' 2"  (1.575 m)   Wt 65.8 kg   LMP 12/15/2015 (Approximate)   SpO2 100%   BMI 26.52 kg/m   Physical Exam  Constitutional: She appears well-developed and well-nourished.  HENT:  Head: Normocephalic.  Eyes: Conjunctivae are normal.  Neck: Normal range of motion. Neck supple.  Cardiovascular: Normal rate and regular rhythm.   No murmur  heard. Pulmonary/Chest: Breath sounds normal. Tachypnea noted. She has no wheezes. She has no rales.  Abdominal: Soft. Bowel sounds are normal. There is no tenderness. There is no rebound and no guarding.  Musculoskeletal: Normal range of motion.  Neurological: She is alert. No cranial nerve deficit.  Skin: Skin is warm and dry. No rash noted.     ED Treatments / Results  Labs (all labs ordered are listed, but only abnormal results are displayed) Labs Reviewed  BASIC METABOLIC PANEL - Abnormal; Notable for the following:       Result Value   Chloride 100 (*)    CO2 10 (*)    Glucose, Bld 581 (*)    Creatinine, Ser 1.26 (*)    GFR calc non Af Amer 50 (*)    GFR calc Af Amer 57 (*)  Anion gap 25 (*)    All other components within normal limits  CBC - Abnormal; Notable for the following:    WBC 14.0 (*)    Hemoglobin 9.9 (*)    HCT 32.4 (*)    MCV 74.1 (*)    MCH 22.7 (*)    RDW 16.7 (*)    Platelets 421 (*)    All other components within normal limits  URINALYSIS, ROUTINE W REFLEX MICROSCOPIC (NOT AT [facility]) - Abnormal; Notable for the following:    Glucose, UA >1000 (*)    Hgb urine dipstick TRACE (*)    Ketones, ur >80 (*)    All other components within normal limits  URINE MICROSCOPIC-ADD ON - Abnormal; Notable for the following:    Squamous Epithelial / LPF 0-5 (*)    Bacteria, UA RARE (*)    All other components within normal limits  CBG MONITORING, ED - Abnormal; Notable for the following:    Glucose-Capillary 492 (*)    All other components within normal limits  BLOOD GAS, VENOUS    EKG  EKG Interpretation None       Radiology No results found.  Procedures Procedures (including critical care time)  Medications Ordered in ED Medications  dextrose 5 %-0.45 % sodium chloride infusion (not administered)  insulin regular (NOVOLIN R,HUMULIN R) 250 Units in sodium chloride 0.9 % 250 mL (1 Units/mL) infusion (not administered)  LORazepam (ATIVAN) injection  1 mg (1 mg Intravenous Given 01/24/16 0217)  metoCLOPramide (REGLAN) injection 10 mg (10 mg Intravenous Given 01/24/16 0217)  sodium chloride 0.9 % bolus 1,000 mL (1,000 mLs Intravenous New Bag/Given 01/24/16 0250)     Initial Impression / Assessment and Plan / ED Course  I have reviewed the triage vital signs and the nursing notes.  Pertinent labs & imaging results that were available during my care of the patient were reviewed by me and considered in my medical decision making (see chart for details).  Clinical Course    Patient presents with nausea and vomiting since yesterday. She states she has "kind of" taken her diabetic medications and is out of her antiemetics. No fever. Well known to the ED for noncompliance and DKA, in DKA currently.   Difficult IV start with one IV in right hand/thumb. Bolusing fluids with glucostabilizer insulin drip. The patient has been given Ativan and Reglan with control of vomiting. She is resting quietly after medications, dozing, easily woken. No tachypnea until disturbed.   2nd IV ordered but unobtainable. Attempt at EJ by Dr. Roxanne Mins also unsuccessful. CBG trending down. Patient is stable and resting. Discussed with [facility] who accepts for admission, advising need for consult for PICC or central line if needed. VSS, mildly tachycardic. Will admit to stepdown unit.  CRITICAL CARE Performed by: Charlann Lange A   Total critical care time: 40 minutes  Critical care time was exclusive of separately billable procedures and treating other patients.  Critical care was necessary to treat or prevent imminent or life-threatening deterioration.  Critical care was time spent personally by me on the following activities: development of treatment plan with patient and/or surrogate as well as nursing, discussions with consultants, evaluation of patient's response to treatment, examination of patient, obtaining history from patient or surrogate, ordering and  performing treatments and interventions, ordering and review of laboratory studies, ordering and review of radiographic studies, pulse oximetry and re-evaluation of patient's condition.   Final Clinical Impressions(s) / ED Diagnoses   Final diagnoses:  None  1. DKA  New Prescriptions New Prescriptions   No medications on file     Charlann Lange, Hershal Coria [id number]    Delora Fuel, MD [id number]

## 2016-01-24 NOTE — Sedation Documentation (Signed)
CBG 108,called [name] in ICU,  Per glucommander insulin drip to be decreased to 1umit.  Change made.

## 2016-01-24 NOTE — ED Notes (Signed)
CBG  492 done by tech

## 2016-01-24 NOTE — ED Notes (Signed)
CBG 420 - move 3.6 rate

## 2016-01-24 NOTE — Procedures (Addendum)
Right DL brachial vein PICC placed. Length 33 cm. Tip SVC/RA junction. No immediate complications. Medication used- 1% Nesacaine to skin/subcutaneous tissue.

## 2016-01-24 NOTE — H&P (Addendum)
History and Physical    [name] [id number] DOB: 02/02/1966 DOA: 01/23/2016  PCP: [name], MD Consultants:  Jarold Song - ? Patient coming from: Home - lives with sister  Chief Complaint: N/V  HPI: [name] is a 50 y.o. female with medical history significant of poorly controlled DM with multiple prior admissions for DKA including as recently as 7/27-7/29/17, HTN, diabetic complications including neuropathy, gastroparesis, and retinopathy presenting with recurrent DKA.  In the interim since discharge, she was also seen at [facility] on 7/31 and was discharged home from the ER.  Patient is not particularly interactive but does report n/v, can't keep anything down.  Patient reports that she is having a "panic attack and it's hard to talk - need my Ativan and pain medicine."  She says that she is unable to keep food down due to her chronic gastroparesis and so then doesn't take her insulin.   ED Course: Found to be in DKA.  Poor vascular access, has 1 IV well secured at thumb.  IVF + insulin drip.  Ativan/Reglan with control of vomiting.  Unsuccessful attempt at EJ - may need PICC or CVL.  Review of Systems: As per HPI; otherwise 10 point review of systems reviewed and negative.    Past Medical History:  Diagnosis Date  . Depression   . Diabetes mellitus without complication ([place])   . Essential hypertension   . Gastroparesis   . GERD (gastroesophageal reflux disease)   . HLD (hyperlipidemia)     Past Surgical History:  Procedure Laterality Date  . COLONOSCOPY  09/27/2014   at [facility]  . ESOPHAGOGASTRODUODENOSCOPY  09/27/2014   at [facility], Dr Rolan Lipa. biospy neg for celiac, neg for H pylori.   . EYE SURGERY    . gailstones    . POSTERIOR VITRECTOMY AND MEMBRANE PEEL-LEFT EYE  09/28/2002  . POSTERIOR VITRECTOMY AND MEMBRANE PEEL-RIGHT EYE  03/16/2002  . RETINAL DETACHMENT SURGERY      Social History   Social History  . Marital status: Single    Spouse name: N/A  .  Number of children: N/A  . Years of education: N/A   Occupational History  . Not on file.   Social History Main Topics  . Smoking status: Never Smoker  . Smokeless tobacco: Never Used  . Alcohol use No  . Drug use: No  . Sexual activity: Not Currently   Other Topics Concern  . Not on file   Social History Narrative  . No narrative on file    Allergies  Allergen Reactions  . Anesthetics, Amide Nausea And Vomiting  . Penicillins Diarrhea and Nausea And Vomiting    Has patient had a PCN reaction causing immediate rash, facial/tongue/throat swelling, SOB or lightheadedness with hypotension: No Has patient had a PCN reaction causing severe rash involving mucus membranes or skin necrosis: No Has patient had a PCN reaction that required hospitalization No Has patient had a PCN reaction occurring within the last 10 years: Yes  If all of the above answers are "NO", then may proceed with Cephalosporin use.   . Buprenorphine Hcl Rash  . Encainide Nausea And Vomiting    Family History  Problem Relation Age of Onset  . Cystic fibrosis Mother   . Hypertension Father   . Diabetes Brother   . Hypertension Maternal Grandmother     Prior to Admission medications   Medication Sig Start Date End Date Taking? Authorizing Provider  amLODipine (NORVASC) 2.5 MG tablet Take 2.5 mg by mouth  daily.   Yes Historical Provider, MD  atenolol (TENORMIN) 25 MG tablet Take 25 mg by mouth daily.   Yes Historical Provider, MD  busPIRone (BUSPAR) 5 MG tablet Take 5 mg by mouth 3 (three) times daily.   Yes Historical Provider, MD  furosemide (LASIX) 40 MG tablet Take 40 mg by mouth.   Yes Historical Provider, MD  albuterol (PROVENTIL HFA;VENTOLIN HFA) 108 (90 Base) MCG/ACT inhaler Inhale 2 puffs into the lungs every 6 (six) hours as needed for wheezing or shortness of breath. 10/20/15   [name], MD  Blood Glucose Monitoring Suppl (TRUE METRIX METER) DEVI 1 each by Does not apply route 3 (three) times  daily before meals. 07/19/15   [name], MD  glucose blood (TRUE METRIX BLOOD GLUCOSE TEST) test strip Use 3 times daily before meals 07/19/15   [name], MD  insulin aspart (NOVOLOG) 100 UNIT/ML injection Inject 0-15 Units into the skin 3 (three) times daily with meals. Per sliding scale 10/25/15   [name], MD  insulin detemir (LEVEMIR) 100 UNIT/ML injection Inject 0.25 mLs (25 Units total) into the skin daily. Patient taking differently: Inject 20 Units into the skin at bedtime.  10/25/15   [name], MD  Insulin Syringe-Needle U-100 (BD INSULIN SYRINGE ULTRAFINE) 31G X 15/64" 0.5 ML MISC 1 each by Does not apply route 4 (four) times daily as needed. 08/16/15   [name], MD  lisinopril (PRINIVIL,ZESTRIL) 20 MG tablet Take 10 mg by mouth daily.     Historical Provider, MD  metoCLOPramide (REGLAN) 10 MG tablet Take 1 tablet (10 mg total) by mouth 4 (four) times daily -  before meals and at bedtime. 01/11/16   Waynetta Pean, PA-C  ondansetron (ZOFRAN ODT) 4 MG disintegrating tablet Take 1 tablet (4 mg total) by mouth every 8 (eight) hours as needed for nausea. 01/21/16   Thurnell Lose, MD  polyethylene glycol powder (GLYCOLAX/MIRALAX) powder Take 1 Container by mouth daily. Reported on 07/19/2015 04/06/15   Historical Provider, MD  pregabalin (LYRICA) 150 MG capsule Take 1 capsule (150 mg total) by mouth 2 (two) times daily. 10/25/15   [name], MD  TRUEPLUS LANCETS 28G MISC 1 each by Does not apply route 3 (three) times daily before meals. 07/19/15   [name], MD    Physical Exam: Vitals:   01/24/16 0326 01/24/16 0330 01/24/16 0430 01/24/16 0500  BP: 158/72 159/64 148/58 154/72  Pulse: 110 110 112 113  Resp: 25 25 (!) 32 26  Temp: 98.5 F (36.9 C)     TempSrc: Oral     SpO2: 100% 100% 100% 100%  Weight:      Height:         General:  Appears anxious, somewhat tearful Eyes: PERRL, EOMI, normal lids, iris ENT:  grossly normal hearing, lips & tongue, somewhat dry mm;  some tongue thrusting/writhing movements noted Neck:  no LAD, masses or thyromegaly Cardiovascular:  tachycardia, no m/r/g. No LE edema.  Respiratory:  CTA bilaterally, no w/r/r. Normal respiratory effort. Abdomen:  soft, ntnd, NABS Skin:  no rash or induration seen on limited exam Musculoskeletal:  grossly normal tone BUE/BLE, good ROM, no bony abnormality Psychiatric:  anxious, mild slurring of speech, AOx3 Neurologic:  CN 2-12 grossly intact, moves all extremities in coordinated fashion, sensation intact  Labs on Admission: I have personally reviewed following labs and imaging studies  CBC:  Recent Labs Lab 01/19/16 1151 01/24/16 0153  WBC 12.3* 14.0*  NEUTROABS 11.2*  --  HGB 9.8* 9.9*  HCT 32.9* 32.4*  MCV 76.2* 74.1*  PLT 435* [id number]*   Basic Metabolic Panel:  Recent Labs Lab 01/19/16 1151  01/20/16 0149 01/20/16 0412 01/20/16 1054 01/21/16 0454 01/24/16 0153  NA 133*  < > 140 139 136 135 135  K 4.7  < > 4.5 4.1 3.7 4.0 3.9  CL 98*  < > 115* 115* 112* 110 100*  CO2 15*  < > 15* 16* 17* 20* 10*  GLUCOSE 620*  < > 75 125* 146* 255* 581*  BUN 22*  < > 21* 20 18 15 17   CREATININE 1.38*  < > 1.30* 1.39* 1.22* 1.11* 1.26*  CALCIUM 9.6  < > 8.2* 8.1* 7.9* 8.4* 9.5  MG 2.1  --   --  1.8  --   --   --   < > = values in this interval not displayed. GFR: Estimated Creatinine Clearance: 48.6 mL/min (by C-G formula based on SCr of 1.26 mg/dL). Liver Function Tests:  Recent Labs Lab 01/19/16 1151  AST 24  ALT 17  ALKPHOS 114  BILITOT 1.3*  PROT 8.2*  ALBUMIN 4.3   No results for input(s): LIPASE, AMYLASE in the last 168 hours. No results for input(s): AMMONIA in the last 168 hours. Coagulation Profile: No results for input(s): INR, PROTIME in the last 168 hours. Cardiac Enzymes: No results for input(s): CKTOTAL, CKMB, CKMBINDEX, TROPONINI in the last 168 hours. BNP (last 3 results) No results for input(s): PROBNP in the last 8760 hours. HbA1C: No results for  input(s): HGBA1C in the last 72 hours. CBG:  Recent Labs Lab 01/21/16 1245 01/24/16 0016 01/24/16 0322 01/24/16 0434 01/24/16 0556  GLUCAP 201* 492* 570* 420* 380*   Lipid Profile: No results for input(s): CHOL, HDL, LDLCALC, TRIG, CHOLHDL, LDLDIRECT in the last 72 hours. Thyroid Function Tests: No results for input(s): TSH, T4TOTAL, FREET4, T3FREE, THYROIDAB in the last 72 hours. Anemia Panel: No results for input(s): VITAMINB12, FOLATE, FERRITIN, TIBC, IRON, RETICCTPCT in the last 72 hours. Urine analysis:    Component Value Date/Time   COLORURINE YELLOW 01/23/2016 [place] 01/23/2016 2346   LABSPEC 1.020 01/23/2016 2346   PHURINE 6.0 01/23/2016 2346   GLUCOSEU >1000 (A) 01/23/2016 2346   HGBUR TRACE (A) 01/23/2016 2346   BILIRUBINUR NEGATIVE 01/23/2016 2346   BILIRUBINUR NEGATIVE 08/04/2012 1005   KETONESUR >80 (A) 01/23/2016 2346   PROTEINUR NEGATIVE 01/23/2016 2346   UROBILINOGEN 0.2 04/27/2015 1608   NITRITE NEGATIVE 01/23/2016 2346   LEUKOCYTESUR NEGATIVE 01/23/2016 2346    Creatinine Clearance: Estimated Creatinine Clearance: 48.6 mL/min (by C-G formula based on SCr of 1.26 mg/dL).  Sepsis Labs: @LABRCNTIP (procalcitonin:4,lacticidven:4) ) Recent Results (from the past 240 hour(s))  MRSA PCR Screening     Status: None   Collection Time: 01/19/16  3:50 PM  Result Value Ref Range Status   MRSA by PCR NEGATIVE NEGATIVE Final    Comment:        The GeneXpert MRSA Assay (FDA approved for NASAL specimens only), is one component of a comprehensive MRSA colonization surveillance program. It is not intended to diagnose MRSA infection nor to guide or monitor treatment for MRSA infections.      Radiological Exams on Admission: No results found.  EKG: Not done  Assessment/Plan Principal Problem:   Diabetic ketoacidosis without coma associated with type 1 diabetes mellitus ([place]) Active Problems:   High anion gap metabolic acidosis    Nausea & vomiting   Anxiety and  depression   Diabetic gastroparesis ([facility])   Hyponatremia   AKI (acute kidney injury) ([place])   Anemia, iron deficiency   HLD (hyperlipidemia)   Essential hypertension   Leukocytosis   DKA -Patient with poorly controlled DM due at least in part to non-CPL -She was just discharged with the same diagnosis on 7/29 and has already had 1 interim ER visit prior to tonight's presentation -Moderate DKA based on pH 7.2, bicarb 10, gap 25 -Patient with IVF bolus and initiation of insulin drip in ER -Will admit to SDU with insulin drip order set for DKA -A1c during last admission was 9.4, indicating poor control at baseline -With rapid recurrence of DKA, would suggest longer period of stabilization as an inpatient prior to return to home -SW consult for recurrent outpatient treatment failure -Poor vascular access, PICC line requested  Gastroparesis -Patient reports that this is part of the reason for repeat admission -She is unable to tolerate PO and so stops taking insulin -Holding Reglan for now until EKG is obtained based on prior h/o prolonged Qt (although did not appear to be present on 7/19 EKG) -Will use Compazine for now for n/v as well as anxiety (see below)  Anxiety/depression -Patient with reported h/o anxiety/depression, taking BuSpar at home -Patient reporting "panic attack" in ER -Suspect underlying depression/anxiety as strong contributing factor to recurrent DKA -Suggest consideration of psychiatry consultation when patient is medically stable  HTN -Suboptimal control thus far in ER -Continue home medications (Atenolol, Norvasc, Lisinopril) for now and follow -Based on non-CPL with insulin, there is concern for non-CPL with these medications, as well  AKI -Mild -Baseline creatinine appears to be about 1.1, currently 1.26 -Will rehydrate as per DKA protocol and follow with q4h BMP  Anemia -Stable  -No further evaluation at this  time  Leukocytosis -Mild -Suspect acute stress response -No current indication of infection -Will follow    DVT prophylaxis: Lovenox Code Status:  Full  Family Communication: None present Disposition Plan:  Home once clinically improved Consults called: None  Admission status: Admit to SDU    Karmen Bongo MD [facility]  If 7PM-7AM, please contact night-coverage [email or web address] Password [email or web address]  01/24/2016, 6:11 AM

## 2016-01-25 DIAGNOSIS — E872 Acidosis: Secondary | ICD-10-CM

## 2016-01-25 DIAGNOSIS — F418 Other specified anxiety disorders: Secondary | ICD-10-CM

## 2016-01-25 DIAGNOSIS — D72829 Elevated white blood cell count, unspecified: Secondary | ICD-10-CM

## 2016-01-25 DIAGNOSIS — G43A1 Cyclical vomiting, intractable: Secondary | ICD-10-CM

## 2016-01-25 DIAGNOSIS — E871 Hypo-osmolality and hyponatremia: Secondary | ICD-10-CM

## 2016-01-25 DIAGNOSIS — K3184 Gastroparesis: Secondary | ICD-10-CM

## 2016-01-25 DIAGNOSIS — I10 Essential (primary) hypertension: Secondary | ICD-10-CM

## 2016-01-25 DIAGNOSIS — E1010 Type 1 diabetes mellitus with ketoacidosis without coma: Principal | ICD-10-CM

## 2016-01-25 DIAGNOSIS — N179 Acute kidney failure, unspecified: Secondary | ICD-10-CM

## 2016-01-25 DIAGNOSIS — E1143 Type 2 diabetes mellitus with diabetic autonomic (poly)neuropathy: Secondary | ICD-10-CM

## 2016-01-25 DIAGNOSIS — D509 Iron deficiency anemia, unspecified: Secondary | ICD-10-CM

## 2016-01-25 LAB — GLUCOSE, CAPILLARY
GLUCOSE-CAPILLARY: 143 mg/dL — AB (ref 65–99)
GLUCOSE-CAPILLARY: 139 mg/dL — AB (ref 65–99)
GLUCOSE-CAPILLARY: 105 mg/dL — AB (ref 65–99)
GLUCOSE-CAPILLARY: 41 mg/dL — AB (ref 65–99)
GLUCOSE-CAPILLARY: 145 mg/dL — AB (ref 65–99)
Glucose-Capillary: 105 mg/dL — ABNORMAL HIGH (ref 65–99)
Glucose-Capillary: 108 mg/dL — ABNORMAL HIGH (ref 65–99)
Glucose-Capillary: 130 mg/dL — ABNORMAL HIGH (ref 65–99)
Glucose-Capillary: 133 mg/dL — ABNORMAL HIGH (ref 65–99)
Glucose-Capillary: 133 mg/dL — ABNORMAL HIGH (ref 65–99)
Glucose-Capillary: 142 mg/dL — ABNORMAL HIGH (ref 65–99)
Glucose-Capillary: 146 mg/dL — ABNORMAL HIGH (ref 65–99)
Glucose-Capillary: 175 mg/dL — ABNORMAL HIGH (ref 65–99)
Glucose-Capillary: 188 mg/dL — ABNORMAL HIGH (ref 65–99)
Glucose-Capillary: 85 mg/dL (ref 65–99)
Glucose-Capillary: 90 mg/dL (ref 65–99)

## 2016-01-25 LAB — RAPID URINE DRUG SCREEN, HOSP PERFORMED
Amphetamines: NOT DETECTED
Barbiturates: NOT DETECTED
Benzodiazepines: POSITIVE — AB
Cocaine: NOT DETECTED
Opiates: POSITIVE — AB
Tetrahydrocannabinol: NOT DETECTED

## 2016-01-25 LAB — BASIC METABOLIC PANEL
Anion gap: 5 (ref 5–15)
BUN: 9 mg/dL (ref 6–20)
CO2: 20 mmol/L — ABNORMAL LOW (ref 22–32)
Calcium: 8.2 mg/dL — ABNORMAL LOW (ref 8.9–10.3)
Chloride: 113 mmol/L — ABNORMAL HIGH (ref 101–111)
Creatinine, Ser: 1.09 mg/dL — ABNORMAL HIGH (ref 0.44–1.00)
GFR calc Af Amer: >60 mL/min (ref >60)
GFR calc non Af Amer: 59 mL/min — ABNORMAL LOW (ref >60)
Glucose, Bld: 112 mg/dL — ABNORMAL HIGH (ref 65–99)
Potassium: 2.9 mmol/L — ABNORMAL LOW (ref 3.5–5.1)
Sodium: 138 mmol/L (ref 135–145)

## 2016-01-25 MED ORDER — INSULIN ASPART 100 UNIT/ML ~~LOC~~ SOLN
0.0000 [IU] | Freq: Every day | SUBCUTANEOUS | Status: DC
Start: 2016-01-25 — End: 2016-01-26

## 2016-01-25 MED ORDER — INSULIN ASPART 100 UNIT/ML ~~LOC~~ SOLN
3.0000 [IU] | Freq: Three times a day (TID) | SUBCUTANEOUS | Status: DC
  Administered 2016-01-25 – 2016-01-26 (×3): 3 [IU] via SUBCUTANEOUS

## 2016-01-25 MED ORDER — POTASSIUM CHLORIDE CRYS ER 20 MEQ PO TBCR
40.0000 meq | EXTENDED_RELEASE_TABLET | Freq: Two times a day (BID) | ORAL | Status: AC
  Administered 2016-01-25 (×2): 40 meq via ORAL
  Filled 2016-01-25 (×3): qty 2

## 2016-01-25 MED ORDER — ACETAMINOPHEN 325 MG PO TABS
650.0000 mg | ORAL_TABLET | Freq: Four times a day (QID) | ORAL | Status: DC | PRN
  Administered 2016-01-25: 650 mg via ORAL
  Filled 2016-01-25: qty 2

## 2016-01-25 MED ORDER — INSULIN ASPART 100 UNIT/ML ~~LOC~~ SOLN: 0.0000 [IU] | Freq: Three times a day (TID) | SUBCUTANEOUS | Status: DC

## 2016-01-25 MED ORDER — POTASSIUM CHLORIDE 10 MEQ/100ML IV SOLN
10.0000 meq | INTRAVENOUS | Status: AC
  Administered 2016-01-25 (×5): 10 meq via INTRAVENOUS
  Filled 2016-01-25 (×3): qty 100

## 2016-01-25 MED ORDER — INSULIN ASPART 100 UNIT/ML ~~LOC~~ SOLN
0.0000 [IU] | Freq: Three times a day (TID) | SUBCUTANEOUS | Status: DC
  Administered 2016-01-26: 3 [IU] via SUBCUTANEOUS
  Administered 2016-01-26: 5 [IU] via SUBCUTANEOUS

## 2016-01-25 MED ORDER — SODIUM CHLORIDE 0.45 % IV SOLN
INTRAVENOUS | Status: DC
  Administered 2016-01-25: 09:00:00 via INTRAVENOUS
  Filled 2016-01-25 (×2): qty 1000

## 2016-01-25 MED ORDER — INSULIN DETEMIR 100 UNIT/ML ~~LOC~~ SOLN
13.0000 [IU] | Freq: Two times a day (BID) | SUBCUTANEOUS | Status: DC
  Administered 2016-01-25 – 2016-01-26 (×3): 13 [IU] via SUBCUTANEOUS
  Filled 2016-01-25 (×4): qty 0.13

## 2016-01-25 MED ORDER — SODIUM CHLORIDE 0.9% FLUSH: 10.0000 mL | INTRAVENOUS | Status: DC | PRN

## 2016-01-25 NOTE — Progress Notes (Signed)
[facility] PROGRESS NOTE    Progress Note  [name]  [id number] DOB: 06-26-1965 DOA: 01/23/2016 PCP: [name], MD     Brief Narrative:   [name] is an 50 y.o. female past medical history of diabetes type 2 gastroparesis is coming with DKA.  Assessment/Plan:   Diabetic ketoacidosis witDKA. hout coma associated with type 1 diabetes mellitus (HCC)/High anion gap metabolic acidosis - Probably due to noncompliance, before this admission she had aerated visited the ED once. - Bicarbonate greater than 20, DC insulin drip continue Lantus plus sliding scale insulin. -Her last A1c was 9.4. - Change IV fluids to half-normal saline with potassium. - Appreciate interventional radiology PICC line placement on 01/24/2016.  Nausea & vomiting due to gastroparesis: Her previous EKG on 720 showed a QTC of 471, she is tolerating her diet Continue Reglan, Discontinue Compazine  Anxiety and depression: Continue benzodiazepines. She did not go home with prescriptions of benzodiazepine she'll have to follow-up with her PCP.  Hyponatremia: Resolved with IV fluid hydration.  AKI (acute kidney injury) ([place]): Hold ACE inhibitor due to acute kidney injury. Resolved with IV fluid hydration.  Anemia, iron deficiency: Will need further follow-up as outpatient with PCP.  HLD (hyperlipidemia): Cont statins  Essential hypertension: Improved significantly once her home regimen was resumed.    Leukocytosis: She has remained febrile. Likely due to stressed emargination.   DVT prophylaxis: lovenox Family Communication:none Disposition Plan/Barrier to D/C: home in 2 days Code Status:     Code Status Orders        Start     Ordered   01/24/16 0702  Full code  Continuous     01/24/16 0701    Code Status History    Date Active Date Inactive Code Status Order ID Comments User Context   01/19/2016  2:23 PM 01/21/2016  5:38 PM Full Code [id number]  Thurnell Lose, MD  ED   11/27/2015 10:01 PM 12/01/2015  6:39 PM Full Code [id number]  Toy Baker, MD Inpatient   11/14/2015  4:05 AM 11/16/2015  7:31 PM Full Code [id number]  Ivor Costa, MD ED   09/02/2015 12:33 PM 09/05/2015 10:12 PM Full Code [id number]  Kelvin Cellar, MD ED   07/29/2015  7:55 AM 08/01/2015 11:03 PM Full Code [id number]  Reyne Dumas, MD ED   06/29/2015  8:27 PM 07/03/2015  5:58 PM Full Code [id number]  Robbie Lis, MD Inpatient   06/22/2015 10:58 AM 06/25/2015  6:47 PM Full Code [id number]  Radene Gunning, NP Inpatient   06/14/2015  6:29 PM 06/20/2015  9:40 PM Full Code [id number]  Domenic Polite, MD Inpatient   05/26/2015 10:02 PM 06/05/2015  7:29 PM Full Code [id number]  Reubin Milan, MD Inpatient   05/23/2015  7:17 AM 05/24/2015  6:19 PM Full Code [id number]  Norval Morton, MD Inpatient   05/17/2015 12:21 PM 05/20/2015  6:13 PM Full Code [id number]  Eugenie Filler, MD Inpatient   05/17/2015 11:09 AM 05/17/2015 12:21 PM Full Code [id number]  Eugenie Filler, MD ED   04/19/2015  2:38 AM 04/20/2015 10:29 PM Full Code [id number]  Rise Patience, MD Inpatient   04/05/2015  5:43 AM 04/09/2015  7:25 PM Full Code [id number]  Rise Patience, MD Inpatient   04/05/2015  5:37 AM 04/05/2015  5:43 AM Full Code [id number]  Despina Hick, RN Inpatient   03/09/2015  7:42 AM 03/12/2015  7:11 PM Full Code [id number]  Theodis Blaze, MD Inpatient  02/20/2015  1:08 AM 02/22/2015  7:08 PM Full Code [id number]  Toy Baker, MD Inpatient   02/08/2015 11:43 PM 02/13/2015  3:32 PM Full Code [id number]  Lavina Hamman, MD ED   10/21/2014 12:57 AM 10/23/2014  3:14 PM Full Code [id number]  Rise Patience, MD Inpatient   02/12/2013 11:16 PM 02/17/2013  9:22 PM Full Code [id number]  Quintella Baton, MD Inpatient   11/25/2012  1:48 PM 11/28/2012  7:04 PM Full Code [id number]  Elmarie Shiley, MD ED   11/05/2012  5:37 PM 11/07/2012  4:20 PM Full Code [id number]  Sheila Oats, MD Inpatient   04/03/2012  2:27 AM 04/05/2012  4:43 PM  Full Code [id number]  Quintella Baton, MD Inpatient   03/24/2012 12:21 AM 03/28/2012  5:51 PM Full Code [id number]  Leeroy Cha, RN ED        IV Access:    Peripheral IV   Procedures and diagnostic studies:   Ir Fluoro Guide Cv Line Right  Result Date: 01/24/2016 INDICATION: Diabetic ketoacidosis, poor venous access. Central venous access requested for fluids and medications. EXAM: RIGHT UPPER EXTREMITY PICC LINE PLACEMENT WITH ULTRASOUND AND FLUOROSCOPIC GUIDANCE MEDICATIONS: 1% Nesacaine to skin and subcutaneous tissue ANESTHESIA/SEDATION: None FLUOROSCOPY TIME:  Fluoroscopy Time:  12 seconds COMPLICATIONS: None immediate. PROCEDURE: The patient was advised of the possible risks and complications and agreed to undergo the procedure. The patient was then brought to the angiographic suite for the procedure. The right arm was prepped with chlorhexidine, draped in the usual sterile fashion using maximum barrier technique (cap and mask, sterile gown, sterile gloves, large sterile sheet, hand hygiene and cutaneous antisepsis) and infiltrated locally with 1% Lidocaine. Ultrasound demonstrated patency of the right brachial vein, and this was documented with an image. Under real-time ultrasound guidance, this vein was accessed with a 21 gauge micropuncture needle and image documentation was performed. A 0.018 wire was introduced in to the vein. Over this, a 5 [place] double lumen power injectable PICC was advanced to the lower SVC/right atrial junction. Fluoroscopy during the procedure and fluoro spot radiograph confirms appropriate catheter position. The catheter was flushed and covered with a sterile dressing. Catheter length: 33 cm IMPRESSION: Successful right arm Power PICC line placement with ultrasound and fluoroscopic guidance. The catheter is ready for use. Read by: Rowe Robert, PA-C Electronically Signed   By: Markus Daft M.D.   On: 01/24/2016 16:06   Ir US Guide Vasc Access  Right  Result Date: 01/24/2016 INDICATION: Diabetic ketoacidosis, poor venous access. Central venous access requested for fluids and medications. EXAM: RIGHT UPPER EXTREMITY PICC LINE PLACEMENT WITH ULTRASOUND AND FLUOROSCOPIC GUIDANCE MEDICATIONS: 1% Nesacaine to skin and subcutaneous tissue ANESTHESIA/SEDATION: None FLUOROSCOPY TIME:  Fluoroscopy Time:  12 seconds COMPLICATIONS: None immediate. PROCEDURE: The patient was advised of the possible risks and complications and agreed to undergo the procedure. The patient was then brought to the angiographic suite for the procedure. The right arm was prepped with chlorhexidine, draped in the usual sterile fashion using maximum barrier technique (cap and mask, sterile gown, sterile gloves, large sterile sheet, hand hygiene and cutaneous antisepsis) and infiltrated locally with 1% Lidocaine. Ultrasound demonstrated patency of the right brachial vein, and this was documented with an image. Under real-time ultrasound guidance, this vein was accessed with a 21 gauge micropuncture needle and image documentation was performed. A 0.018 wire was introduced in to the vein. Over this, a 5 [place] double lumen power injectable PICC was advanced to the  lower SVC/right atrial junction. Fluoroscopy during the procedure and fluoro spot radiograph confirms appropriate catheter position. The catheter was flushed and covered with a sterile dressing. Catheter length: 33 cm IMPRESSION: Successful right arm Power PICC line placement with ultrasound and fluoroscopic guidance. The catheter is ready for use. Read by: Rowe Robert, PA-C Electronically Signed   By: Markus Daft M.D.   On: 01/24/2016 16:06     Medical Consultants:    None.  Anti-Infectives:   None Subjective:    [name] she continues complain of nausea and is dry heaving.  Objective:    Vitals:   01/24/16 2200 01/24/16 2300 01/25/16 0000 01/25/16 0400  BP: (!) 129/52 (!) 106/43 (!) 118/51 (!) 105/47   Pulse: 88 84 87 81  Resp: 19 15 17 17   Temp:   99 F (37.2 C) 98.6 F (37 C)  TempSrc:   Oral Oral  SpO2: 100% 97% 100% 100%  Weight:      Height:        Intake/Output Summary (Last 24 hours) at 01/25/16 0813 Last data filed at 01/25/16 [id number]  Gross per 24 hour  Intake          3024.57 ml  Output              560 ml  Net          2464.57 ml   Filed Weights   01/24/16 0023  Weight: 65.8 kg (145 lb)    Exam: General exam: In no acute distress. Respiratory system: Good air movement and clear to auscultation. Cardiovascular system: S1 & S2 heard, RRR. Gastrointestinal system: Abdomen is nondistended, soft and nontender.  Extremities: No pedal edema. Skin: No rashes, lesions or ulcers Psychiatry: Judgement and insight appear normal. Mood & affect appropriate.    Data Reviewed:    Labs: Basic Metabolic Panel:  Recent Labs Lab 01/19/16 1151  01/20/16 0412  01/24/16 0758 01/24/16 1100 01/24/16 1620 01/24/16 2020 01/25/16 0523  NA 133*  < > 139  < > 143 142 140 138 138  K 4.7  < > 4.1  < > 4.0 3.7 3.6 3.3* 2.9*  CL 98*  < > 115*  < > 114* 116* 113* 112* 113*  CO2 15*  < > 16*  < > 12* 14* 18* 20* 20*  GLUCOSE 620*  < > 125*  < > 272* 208* 138* 166* 112*  BUN 22*  < > 20  < > 14 11 10 9 9   CREATININE 1.38*  < > 1.39*  < > 1.11* 0.97 0.88 0.91 1.09*  CALCIUM 9.6  < > 8.1*  < > 8.8* 8.9 8.6* 8.2* 8.2*  MG 2.1  --  1.8  --  1.8  --   --   --   --   PHOS  --   --   --   --  2.0*  --   --   --   --   < > = values in this interval not displayed. GFR Estimated Creatinine Clearance: 56.2 mL/min (by C-G formula based on SCr of 1.09 mg/dL). Liver Function Tests:  Recent Labs Lab 01/19/16 1151  AST 24  ALT 17  ALKPHOS 114  BILITOT 1.3*  PROT 8.2*  ALBUMIN 4.3   No results for input(s): LIPASE, AMYLASE in the last 168 hours. No results for input(s): AMMONIA in the last 168 hours. Coagulation profile No results for input(s): INR, PROTIME in the last 168  hours.  CBC:  Recent Labs Lab 01/19/16 1151 01/24/16 0153  WBC 12.3* 14.0*  NEUTROABS 11.2*  --   HGB 9.8* 9.9*  HCT 32.9* 32.4*  MCV 76.2* 74.1*  PLT 435* 421*   Cardiac Enzymes: No results for input(s): CKTOTAL, CKMB, CKMBINDEX, TROPONINI in the last 168 hours. BNP (last 3 results) No results for input(s): PROBNP in the last 8760 hours. CBG:  Recent Labs Lab 01/25/16 0139 01/25/16 0250 01/25/16 0352 01/25/16 0507 01/25/16 0614  GLUCAP 133* 139* 146* 130* 105*   D-Dimer: No results for input(s): DDIMER in the last 72 hours. Hgb A1c: No results for input(s): HGBA1C in the last 72 hours. Lipid Profile: No results for input(s): CHOL, HDL, LDLCALC, TRIG, CHOLHDL, LDLDIRECT in the last 72 hours. Thyroid function studies: No results for input(s): TSH, T4TOTAL, T3FREE, THYROIDAB in the last 72 hours.  Invalid input(s): FREET3 Anemia work up: No results for input(s): VITAMINB12, FOLATE, FERRITIN, TIBC, IRON, RETICCTPCT in the last 72 hours. Sepsis Labs:  Recent Labs Lab 01/19/16 1151 01/24/16 0153  WBC 12.3* 14.0*   Microbiology Recent Results (from the past 240 hour(s))  MRSA PCR Screening     Status: None   Collection Time: 01/19/16  3:50 PM  Result Value Ref Range Status   MRSA by PCR NEGATIVE NEGATIVE Final    Comment:        The GeneXpert MRSA Assay (FDA approved for NASAL specimens only), is one component of a comprehensive MRSA colonization surveillance program. It is not intended to diagnose MRSA infection nor to guide or monitor treatment for MRSA infections.   MRSA PCR Screening     Status: None   Collection Time: 01/24/16  8:59 AM  Result Value Ref Range Status   MRSA by PCR NEGATIVE NEGATIVE Final    Comment:        The GeneXpert MRSA Assay (FDA approved for NASAL specimens only), is one component of a comprehensive MRSA colonization surveillance program. It is not intended to diagnose MRSA infection nor to guide or monitor treatment  for MRSA infections.      Medications:   . amLODipine  2.5 mg Oral Daily  . atenolol  25 mg Oral Daily  . busPIRone  5 mg Oral TID  . enoxaparin (LOVENOX) injection  40 mg Subcutaneous Q24H  . insulin aspart  0-15 Units Subcutaneous TID WC  . insulin aspart  0-5 Units Subcutaneous QHS  . insulin aspart  3 Units Subcutaneous TID WC  . insulin detemir  13 Units Subcutaneous BID  . metoCLOPramide  10 mg Oral TID AC & HS  . potassium chloride  10 mEq Intravenous Q1 Hr x 5   Continuous Infusions: . sodium chloride 0.45 % 1,000 mL with potassium chloride 40 mEq infusion      Time spent: 25 min   LOS: 1 day   Charlynne Cousins  [facility] Pager [phone]  *Please refer to [email or web address], password [email or web address] to get updated schedule on who will round on this patient, as hospitalists switch teams weekly. If 7PM-7AM, please contact night-coverage at [email or web address], password [email or web address] for any overnight needs.  01/25/2016, 8:13 AM

## 2016-01-25 NOTE — Progress Notes (Signed)
Hypoglycemic Event  CBG: 41  Treatment: 15 GM carbohydrate snack  Symptoms: None  Follow-up CBG: Time:1800 CBG Result:  Possible Reasons for Event: Inadequate meal intake and Unknown  Comments/MD notified: Dorina Hoyer

## 2016-01-25 NOTE — Hospital Discharge Follow-Up (Addendum)
[facility] at [place]:  Patient known to the [facility] [place]. Met with patient, and patient agreeable to follow-up with the [facility] after discharge. Appointment scheduled for 01/31/16 at 1000 with Dr. Jarold Song, and an appointment has been scheduled on 01/31/16 at 25 with [facility]. Appointments on AVS. Patient indicated she will need transportation to her appointments. Will notify [place] and [facility]. Patient reminded of pharmacy resources available at [place] and informed patient she is able to get discharge medications filled at [facility] and [facility] if needed. Patient appreciative of information. Velva Harman, RN CM updated. Will continue to follow patient's clinical progress closely.

## 2016-01-25 NOTE — Progress Notes (Signed)
CSW consulted to assist with d/c planning. PT eval / recommendations are pending. CSW will meet with pt following PT eval.  Werner Lean LCSW [phone]

## 2016-01-25 NOTE — Evaluation (Signed)
Physical Therapy Evaluation Patient Details Name: [name] MRN: [id number] DOB: June 22, 1966 Today's Date: 01/25/2016   History of Present Illness  50 yr old female admitted with DKA  Clinical Impression  Pt admitted as above and presenting with functional mobility limitations 2* generalized weakness and balance deficits.  Pt should progress to dc home with family assist and would benefit from follow up HHPT to further address strength and balance deficits.    Follow Up Recommendations Home health PT    Equipment Recommendations  Cane    Recommendations for Other Services OT consult     Precautions / Restrictions Precautions Precautions: Fall Restrictions Weight Bearing Restrictions: No      Mobility  Bed Mobility Overal bed mobility: Needs Assistance Bed Mobility: Supine to Sit     Supine to sit: HOB elevated;Min guard        Transfers Overall transfer level: Needs assistance   Transfers: Sit to/from Stand Sit to Stand: Min assist         General transfer comment: cues for safe transition and use of UEs to self assist.  Physical assist to bring wt up and fwd and to balance in initial standing  Ambulation/Gait Ambulation/Gait assistance: Min assist Ambulation Distance (Feet): 200 Feet Assistive device: Rolling walker (2 wheeled) Gait Pattern/deviations: Step-through pattern;Decreased step length - right;Decreased step length - left;Shuffle;Trunk flexed;Narrow base of support Gait velocity: decr Gait velocity interpretation: Below normal speed for age/gender General Gait Details: cues for posture and position from RW; multiple short standing rests to complete task 2* LE pain and fatigue  Stairs            Wheelchair Mobility    Modified Rankin (Stroke Patients Only)       Balance Overall balance assessment: Needs assistance Sitting-balance support: No upper extremity supported;Feet supported Sitting balance-Leahy Scale: Good     Standing  balance support: Bilateral upper extremity supported Standing balance-Leahy Scale: Poor                               Pertinent Vitals/Pain Pain Assessment: Faces Faces Pain Scale: Hurts little more Pain Location: LEs with WB Pain Descriptors / Indicators: Aching Pain Intervention(s): Limited activity within patient's tolerance;Monitored during session    Home Living Family/patient expects to be discharged to:: Private residence Living Arrangements: Other relatives Available Help at Discharge: Family Type of Home: Apartment Home Access: Stairs to enter Entrance Stairs-Rails: Psychiatric nurse of Steps: flight Home Layout: One level Home Equipment: None      Prior Function Level of Independence: Independent         Comments: per pt walking without AD but has used cane in past     Hand Dominance        Extremity/Trunk Assessment   Upper Extremity Assessment: Generalized weakness           Lower Extremity Assessment: Generalized weakness;RLE deficits/detail;LLE deficits/detail      Cervical / Trunk Assessment: Normal  Communication   Communication: No difficulties  Cognition Arousal/Alertness: Awake/alert Behavior During Therapy: WFL for tasks assessed/performed Overall Cognitive Status: Within Functional Limits for tasks assessed                      General Comments      Exercises        Assessment/Plan    PT Assessment Patient needs continued PT services  PT Diagnosis Difficulty walking   PT Problem  List Decreased strength;Decreased range of motion;Decreased activity tolerance;Decreased balance;Decreased mobility;Decreased knowledge of use of DME;Pain;Impaired sensation  PT Treatment Interventions DME instruction;Gait training;Stair training;Functional mobility training;Therapeutic activities;Therapeutic exercise;Patient/family education   PT Goals (Current goals can be found in the Care Plan section) Acute  Rehab PT Goals Patient Stated Goal: regain IND to return home PT Goal Formulation: With patient Time For Goal Achievement: 02/08/16 Potential to Achieve Goals: Good    Frequency Min 3X/week   Barriers to discharge Decreased caregiver support Lives with sister who "works a Occupational psychologist During Treatment: Gait belt Activity Tolerance: Patient tolerated treatment well Patient left: in chair;with call bell/phone within reach;with chair alarm set Nurse Communication: Mobility status         Time: 1030-1055 PT Time Calculation (min) (ACUTE ONLY): 25 min   Charges:   PT Evaluation $PT Eval Low Complexity: 1 Procedure PT Treatments $Gait Training: 8-22 mins   PT G Codes:        [name] 28-Jan-2016, 3:38 PM

## 2016-01-26 ENCOUNTER — Ambulatory Visit: Payer: 59

## 2016-01-26 ENCOUNTER — Inpatient Hospital Stay: Payer: 59 | Admitting: Family Medicine

## 2016-01-26 LAB — BASIC METABOLIC PANEL
ANION GAP: 3 — AB (ref 5–15)
BUN: 8 mg/dL (ref 6–20)
CHLORIDE: 112 mmol/L — AB (ref 101–111)
CO2: 20 mmol/L — ABNORMAL LOW (ref 22–32)
Calcium: 8.8 mg/dL — ABNORMAL LOW (ref 8.9–10.3)
Creatinine, Ser: 0.92 mg/dL (ref 0.44–1.00)
GFR calc Af Amer: >60 mL/min (ref >60)
GLUCOSE: 123 mg/dL — AB (ref 65–99)
POTASSIUM: 4.5 mmol/L (ref 3.5–5.1)
SODIUM: 135 mmol/L (ref 135–145)

## 2016-01-26 LAB — GLUCOSE, CAPILLARY
GLUCOSE-CAPILLARY: 230 mg/dL — AB (ref 65–99)
Glucose-Capillary: 157 mg/dL — ABNORMAL HIGH (ref 65–99)

## 2016-01-26 MED ORDER — ATENOLOL 25 MG PO TABS: 25.0000 mg | ORAL_TABLET | Freq: Every day | ORAL | 0 refills | Status: DC

## 2016-01-26 NOTE — Care Management Note (Signed)
Case Management Note  Patient Details  Name: [name] MRN: [id number] Date of Birth: 1966/03/06  Subjective/Objective:Patient 50 y/o f admitted w/DKA. From home. No insurance.Financial counselor to eval. PT-recc HHPT. No insurance. HHRN-safety eval, HH social worker-[facility] rep [name] aware of referral. [facility] already following. Patient aware to get meds filled @ [place].                  Action/Plan:d/c home w/HHC.   Expected Discharge Date:   (unknown)               Expected Discharge Plan:  [place]  In-House Referral:  Financial Counselor  Discharge planning Services  CM Consult  Post Acute Care Choice:    Choice offered to:  Patient  DME Arranged:    DME Agency:     HH Arranged:    [place] Agency:     Status of Service:  In process, will continue to follow  If discussed at Long Length of Stay Meetings, dates discussed:    Additional Comments:  Dessa Phi, RN 01/26/2016, 11:34 AM

## 2016-01-26 NOTE — Hospital Discharge Follow-Up (Signed)
Transitional Care Clinic at [place]:  Patient has [facility] appointment scheduled for 01/31/16 at 1000 with Dr. Jarold Song, and an appointment on 01/31/16 at 73 with Financial Counselor at [facility] and [facility]. Appointments on AVS. Met with patient at bedside to remind her of upcoming appointments, and to inform her she will receive a post-discharge follow-up phone call from [facility]. Patient verbalized understanding.  Will continue to follow patient's clinical progress closely.

## 2016-01-26 NOTE — Discharge Summary (Signed)
Physician Discharge Summary  Jaquilla Basilio [id number] DOB: 1965/07/18 DOA: 01/23/2016  PCP: [name], MD  Admit date: 01/23/2016 Discharge date: 01/26/2016  Admitted From: Home Disposition:  Home  Recommendations for Outpatient Follow-up:  1. Follow up with PCP in 1-2 weeks, Please obtain BMP/CBC in one week 2. We'll need to talk to her about compliance.  Home Health:No Equipment/Devices:None  Discharge Condition:stable CODE STATUS:full Diet recommendation: Carb Modified   Brief/Interim Summary: 50 year old with past medical history of diabetes or 2 and gastroparesis a comes in to the hospital because of nausea and vomiting in DKA due to noncompliance.  Discharge Diagnoses:  Diabetic ketoacidosis without coma associated with type 1 diabetes mellitus (HCC)/High anion gap metabolic acidosis Due to noncompliance she has no signs of infection no signs of ischemia she was started on IV insulin and her DKA resolved she was changed back to her home regimen and her blood glucose remained stable. Her A1c was 9.4. She'll follow-up with her primary care doctor and will have to talk to her and counseled her about compliance.  Nausea & vomiting Likely due to gastroparesis is resolved with Reglan.  Anxiety and depression Continue BuSpar) status as an outpatient.  Hyponatremia Resolved with IV hydration  AKI (acute kidney injury) ([place]): Likely prerenal in etiology resolved with IV hydration.   Anemia, iron deficiency We'll need further workup as an outpatient.   HLD (hyperlipidemia) Continue statins.    Essential hypertension No changes made to her medication continue current regimen.    Discharge Instructions  Discharge Instructions    Diet - low sodium heart healthy    Complete by:  As directed   Increase activity slowly    Complete by:  As directed       Medication List    STOP taking these medications   TRUEPLUS LANCETS 28G Misc     TAKE these medications    albuterol 108 (90 Base) MCG/ACT inhaler Commonly known as:  PROVENTIL HFA;VENTOLIN HFA Inhale 2 puffs into the lungs every 6 (six) hours as needed for wheezing or shortness of breath.   atenolol 25 MG tablet Commonly known as:  TENORMIN Take 1 tablet (25 mg total) by mouth daily.   busPIRone 5 MG tablet Commonly known as:  BUSPAR Take 5 mg by mouth 3 (three) times daily.   furosemide 40 MG tablet Commonly known as:  LASIX Take 40 mg by mouth.   glucose blood test strip Commonly known as:  TRUE METRIX BLOOD GLUCOSE TEST Use 3 times daily before meals   insulin aspart 100 UNIT/ML injection Commonly known as:  novoLOG Inject 0-15 Units into the skin 3 (three) times daily with meals. Per sliding scale   insulin detemir 100 UNIT/ML injection Commonly known as:  LEVEMIR Inject 0.25 mLs (25 Units total) into the skin daily. What changed:  how much to take  when to take this   Insulin Syringe-Needle U-100 31G X 15/64" 0.5 ML Misc Commonly known as:  BD INSULIN SYRINGE ULTRAFINE 1 each by Does not apply route 4 (four) times daily as needed.   lisinopril 20 MG tablet Commonly known as:  PRINIVIL,ZESTRIL Take 10 mg by mouth daily.   metoCLOPramide 10 MG tablet Commonly known as:  REGLAN Take 1 tablet (10 mg total) by mouth 4 (four) times daily -  before meals and at bedtime.   ondansetron 4 MG disintegrating tablet Commonly known as:  ZOFRAN ODT Take 1 tablet (4 mg total) by mouth every 8 (eight) hours as needed  for nausea.   polyethylene glycol powder powder Commonly known as:  GLYCOLAX/MIRALAX Take 1 Container by mouth daily. Reported on 07/19/2015   pregabalin 150 MG capsule Commonly known as:  LYRICA Take 1 capsule (150 mg total) by mouth 2 (two) times daily.   TRUE METRIX METER Devi 1 each by Does not apply route 3 (three) times daily before meals.      Follow-up [place]. Go on 01/31/2016.   Specialty:  Internal  Medicine Why:  at 10:00am for an appointment with Dr Jarold Song in the [facility]. You have an appointment scheduled with the Financial Counselor at 11:00am after than appointment  Contact information: [place]. [address] Sleeper [id number] [place] [phone]       Anesthesiologist .   Specialty:  Anesthesiology Contact information: [place] [address] [phone]          Allergies  Allergen Reactions  . Anesthetics, Amide Nausea And Vomiting  . Penicillins Diarrhea and Nausea And Vomiting    Has patient had a PCN reaction causing immediate rash, facial/tongue/throat swelling, SOB or lightheadedness with hypotension: No Has patient had a PCN reaction causing severe rash involving mucus membranes or skin necrosis: No Has patient had a PCN reaction that required hospitalization No Has patient had a PCN reaction occurring within the last 10 years: Yes  If all of the above answers are "NO", then may proceed with Cephalosporin use.   . Buprenorphine Hcl Rash  . Encainide Nausea And Vomiting    Consultations:  None   Procedures/Studies: Ir Fluoro Guide Cv Line Right  Result Date: 01/24/2016 INDICATION: Diabetic ketoacidosis, poor venous access. Central venous access requested for fluids and medications. EXAM: RIGHT UPPER EXTREMITY PICC LINE PLACEMENT WITH ULTRASOUND AND FLUOROSCOPIC GUIDANCE MEDICATIONS: 1% Nesacaine to skin and subcutaneous tissue ANESTHESIA/SEDATION: None FLUOROSCOPY TIME:  Fluoroscopy Time:  12 seconds COMPLICATIONS: None immediate. PROCEDURE: The patient was advised of the possible risks and complications and agreed to undergo the procedure. The patient was then brought to the angiographic suite for the procedure. The right arm was prepped with chlorhexidine, draped in the usual sterile fashion using maximum barrier technique (cap and mask, sterile gown, sterile gloves, large sterile sheet, hand hygiene and  cutaneous antisepsis) and infiltrated locally with 1% Lidocaine. Ultrasound demonstrated patency of the right brachial vein, and this was documented with an image. Under real-time ultrasound guidance, this vein was accessed with a 21 gauge micropuncture needle and image documentation was performed. A 0.018 wire was introduced in to the vein. Over this, a 5 [place] double lumen power injectable PICC was advanced to the lower SVC/right atrial junction. Fluoroscopy during the procedure and fluoro spot radiograph confirms appropriate catheter position. The catheter was flushed and covered with a sterile dressing. Catheter length: 33 cm IMPRESSION: Successful right arm Power PICC line placement with ultrasound and fluoroscopic guidance. The catheter is ready for use. Read by: Rowe Robert, PA-C Electronically Signed   By: Markus Daft M.D.   On: 01/24/2016 16:06   Ir US Guide Vasc Access Right  Result Date: 01/24/2016 INDICATION: Diabetic ketoacidosis, poor venous access. Central venous access requested for fluids and medications. EXAM: RIGHT UPPER EXTREMITY PICC LINE PLACEMENT WITH ULTRASOUND AND FLUOROSCOPIC GUIDANCE MEDICATIONS: 1% Nesacaine to skin and subcutaneous tissue ANESTHESIA/SEDATION: None FLUOROSCOPY TIME:  Fluoroscopy Time:  12 seconds COMPLICATIONS: None immediate. PROCEDURE: The patient was advised of the possible risks and complications and agreed to  undergo the procedure. The patient was then brought to the angiographic suite for the procedure. The right arm was prepped with chlorhexidine, draped in the usual sterile fashion using maximum barrier technique (cap and mask, sterile gown, sterile gloves, large sterile sheet, hand hygiene and cutaneous antisepsis) and infiltrated locally with 1% Lidocaine. Ultrasound demonstrated patency of the right brachial vein, and this was documented with an image. Under real-time ultrasound guidance, this vein was accessed with a 21 gauge micropuncture needle and image  documentation was performed. A 0.018 wire was introduced in to the vein. Over this, a 5 [place] double lumen power injectable PICC was advanced to the lower SVC/right atrial junction. Fluoroscopy during the procedure and fluoro spot radiograph confirms appropriate catheter position. The catheter was flushed and covered with a sterile dressing. Catheter length: 33 cm IMPRESSION: Successful right arm Power PICC line placement with ultrasound and fluoroscopic guidance. The catheter is ready for use. Read by: Rowe Robert, PA-C Electronically Signed   By: Markus Daft M.D.   On: 01/24/2016 16:06   Dg Abd Acute W/chest  Result Date: 01/19/2016 CLINICAL DATA:  Cyclical vomiting and nausea. EXAM: DG ABDOMEN ACUTE W/ 1V CHEST COMPARISON:  Radiographs dated 11/15/2015 and 07/29/2015 from pump FINDINGS: There is minimal air in the bowel. No appreciable dilated bowel. Surgical clips in the right upper quadrant consistent with previous cholecystectomy. No free air. Bones are normal. IMPRESSION: Benign-appearing abdomen and chest. Electronically Signed   By: Lorriane Shire M.D.   On: 01/19/2016 15:35   (Echo, Carotid, EGD, Colonoscopy, ERCP)    Subjective:   Discharge Exam: Vitals:   01/26/16 1003 01/26/16 1020  BP: (!) 126/50 (!) 122/54  Pulse: 87 80  Resp: 16   Temp: 97.8 F (36.6 C)    Vitals:   01/26/16 0228 01/26/16 0618 01/26/16 1003 01/26/16 1020  BP: (!) 136/56 129/64 (!) 126/50 (!) 122/54  Pulse: 79 80 87 80  Resp: 16 16 16    Temp: 98.1 F (36.7 C) 98.2 F (36.8 C) 97.8 F (36.6 C)   TempSrc: Oral Oral Oral   SpO2: 100% 100% 100%   Weight:      Height:        General: Pt is alert, awake, not in acute distress Cardiovascular: RRR, S1/S2 +, no rubs, no gallops Respiratory: CTA bilaterally, no wheezing, no rhonchi Abdominal: Soft, NT, ND, bowel sounds + Extremities: no edema, no cyanosis    The results of significant diagnostics from this hospitalization (including imaging,  microbiology, ancillary and laboratory) are listed below for reference.     Microbiology: Recent Results (from the past 240 hour(s))  MRSA PCR Screening     Status: None   Collection Time: 01/19/16  3:50 PM  Result Value Ref Range Status   MRSA by PCR NEGATIVE NEGATIVE Final    Comment:        The GeneXpert MRSA Assay (FDA approved for NASAL specimens only), is one component of a comprehensive MRSA colonization surveillance program. It is not intended to diagnose MRSA infection nor to guide or monitor treatment for MRSA infections.   MRSA PCR Screening     Status: None   Collection Time: 01/24/16  8:59 AM  Result Value Ref Range Status   MRSA by PCR NEGATIVE NEGATIVE Final    Comment:        The GeneXpert MRSA Assay (FDA approved for NASAL specimens only), is one component of a comprehensive MRSA colonization surveillance program. It is not intended to diagnose  MRSA infection nor to guide or monitor treatment for MRSA infections.      Labs: BNP (last 3 results) No results for input(s): BNP in the last 8760 hours. Basic Metabolic Panel:  Recent Labs Lab 01/19/16 1151  01/20/16 0412  01/24/16 0758 01/24/16 1100 01/24/16 1620 01/24/16 2020 01/25/16 0523 01/26/16 0415  NA 133*  < > 139  < > 143 142 140 138 138 135  K 4.7  < > 4.1  < > 4.0 3.7 3.6 3.3* 2.9* 4.5  CL 98*  < > 115*  < > 114* 116* 113* 112* 113* 112*  CO2 15*  < > 16*  < > 12* 14* 18* 20* 20* 20*  GLUCOSE 620*  < > 125*  < > 272* 208* 138* 166* 112* 123*  BUN 22*  < > 20  < > 14 11 10 9 9 8   CREATININE 1.38*  < > 1.39*  < > 1.11* 0.97 0.88 0.91 1.09* 0.92  CALCIUM 9.6  < > 8.1*  < > 8.8* 8.9 8.6* 8.2* 8.2* 8.8*  MG 2.1  --  1.8  --  1.8  --   --   --   --   --   PHOS  --   --   --   --  2.0*  --   --   --   --   --   < > = values in this interval not displayed. Liver Function Tests:  Recent Labs Lab 01/19/16 1151  AST 24  ALT 17  ALKPHOS 114  BILITOT 1.3*  PROT 8.2*  ALBUMIN 4.3   No  results for input(s): LIPASE, AMYLASE in the last 168 hours. No results for input(s): AMMONIA in the last 168 hours. CBC:  Recent Labs Lab 01/19/16 1151 01/24/16 0153  WBC 12.3* 14.0*  NEUTROABS 11.2*  --   HGB 9.8* 9.9*  HCT 32.9* 32.4*  MCV 76.2* 74.1*  PLT 435* 421*   Cardiac Enzymes: No results for input(s): CKTOTAL, CKMB, CKMBINDEX, TROPONINI in the last 168 hours. BNP: Invalid input(s): POCBNP CBG:  Recent Labs Lab 01/25/16 1159 01/25/16 1725 01/25/16 1822 01/25/16 2105 01/26/16 0746  GLUCAP 90 41* 105* 145* 230*   D-Dimer No results for input(s): DDIMER in the last 72 hours. Hgb A1c No results for input(s): HGBA1C in the last 72 hours. Lipid Profile No results for input(s): CHOL, HDL, LDLCALC, TRIG, CHOLHDL, LDLDIRECT in the last 72 hours. Thyroid function studies No results for input(s): TSH, T4TOTAL, T3FREE, THYROIDAB in the last 72 hours.  Invalid input(s): FREET3 Anemia work up No results for input(s): VITAMINB12, FOLATE, FERRITIN, TIBC, IRON, RETICCTPCT in the last 72 hours. Urinalysis    Component Value Date/Time   COLORURINE YELLOW 01/23/2016 2346   [place] 01/23/2016 2346   LABSPEC 1.020 01/23/2016 2346   PHURINE 6.0 01/23/2016 2346   GLUCOSEU >1000 (A) 01/23/2016 2346   HGBUR TRACE (A) 01/23/2016 2346   BILIRUBINUR NEGATIVE 01/23/2016 2346   BILIRUBINUR NEGATIVE 08/04/2012 1005   KETONESUR >80 (A) 01/23/2016 2346   PROTEINUR NEGATIVE 01/23/2016 2346   UROBILINOGEN 0.2 04/27/2015 1608   NITRITE NEGATIVE 01/23/2016 2346   LEUKOCYTESUR NEGATIVE 01/23/2016 2346   Sepsis Labs Invalid input(s): PROCALCITONIN,  WBC,  LACTICIDVEN Microbiology Recent Results (from the past 240 hour(s))  MRSA PCR Screening     Status: None   Collection Time: 01/19/16  3:50 PM  Result Value Ref Range Status   MRSA by PCR NEGATIVE NEGATIVE Final  Comment:        The GeneXpert MRSA Assay (FDA approved for NASAL specimens only), is one component of  a comprehensive MRSA colonization surveillance program. It is not intended to diagnose MRSA infection nor to guide or monitor treatment for MRSA infections.   MRSA PCR Screening     Status: None   Collection Time: 01/24/16  8:59 AM  Result Value Ref Range Status   MRSA by PCR NEGATIVE NEGATIVE Final    Comment:        The GeneXpert MRSA Assay (FDA approved for NASAL specimens only), is one component of a comprehensive MRSA colonization surveillance program. It is not intended to diagnose MRSA infection nor to guide or monitor treatment for MRSA infections.      Time coordinating discharge: Over 30 minutes  SIGNED:   Charlynne Cousins, MD  [facility] 01/26/2016, 10:57 AM Pager   If 7PM-7AM, please contact night-coverage [email or web address] Password [email or web address]

## 2016-01-26 NOTE — Care Management Note (Signed)
Case Management Note  Patient Details  Name: [name] MRN: [id number] Date of Birth: December 18, 1965  Subjective/Objective: [facility] HHRN-safety eval, & HH social worker ordered, rep [name] aware of d/c-charity care.Per nsg patient may need transp home-will offer taxi voucher if she is unable to contact family member w/key to get into home-Nsg will confirm if taxi voucher needed.                   Action/Plan:d/c home w/HHC.   Expected Discharge Date:   (unknown)               Expected Discharge Plan:  [place]  In-House Referral:  Financial Counselor  Discharge planning Services  CM Consult  Post Acute Care Choice:    Choice offered to:  Patient  DME Arranged:    DME Agency:     HH Arranged:  RN, Social Work [employer] Agency:  [facility]  Status of Service:  Completed, signed off  If discussed at [employer], dates discussed:    Additional Comments:  Dessa Phi, RN 01/26/2016, 12:15 PM

## 2016-01-27 ENCOUNTER — Telehealth: Payer: Self-pay

## 2016-01-27 NOTE — Telephone Encounter (Signed)
Transitional [facility] Clinic Post-discharge Follow-Up Phone Call:  Date of Discharge: 01/26/16 Principal Discharge Diagnosis(es): Diabetic ketoacidosis without coma associated with type 1 diabetes mellitus Post-discharge Communication: Attempt #1 to reach patient and complete post-discharge follow-up phone call. Call placed to [phone]; unable to reach patient. HIPPA compliant voicemail left requesting return call. Also placed call to patient's son, Bethann Punches, and and left HIPPA compliant message with him asking patient to return call to this Case Manager when able. Awaiting return call. Call Completed: No

## 2016-01-30 ENCOUNTER — Telehealth: Payer: Self-pay

## 2016-01-30 NOTE — Telephone Encounter (Addendum)
Transitional Care Clinic Post-discharge Follow-Up Phone Call:  Date of Discharge: 01/26/2016 Principal Discharge Diagnosis(es): DKA, , nausea/vomiting, anxiety and depression.  Post-discharge Communication: (Clearly document all attempts clearly and date contact made) Call placed to the patient. Call Completed: Yes                   With Whom: Patient No interpreter needed     Please check all that apply:  X Patient is knowledgeable of his/her condition(s) and/or treatment. X  Patient is caring for self at home.  - She lives with her sister but she said that her sister is always working or sleeping.  ? Patient is receiving assist at home from family and/or caregiver. Family and/or caregiver is knowledgeable of patient's condition(s) and/or treatment. X  Patient is receiving home health services. If so, name of agency. - A referral has been made to [facility] for RN, SW.  The patient said that she missed their call on Friday and is waiting for a call back.      Medication Reconciliation:  X  Medication list reviewed with patient. - She did not want to review the list over the phone.  No new medications were ordered. Instructed her to bring all of her medications with her to her appointment tomorrow and Dr Jarold Song would review them and she stated that she would  X  Patient obtained all discharge medications. If not, why? - She said that she has all medications but will need refills on a couple of them. She noted that she does not have anything for her anxiety/panic attacks. She said that she will discuss with Dr Jarold Song tomorrow as the doctor discharging her did not order any medication for her anxiety and instructed her to follow up with her doctor.  She confirmed that she has been checking her blood sugars .She noted that they have been " up and down as always."  She said that she has been eating and reported only some nausea this morning. She said that she took the zofran once today and only  takes it as needed.  She reported that her blood sugar this morning was 220 and yesterday morning it was 75. Instructed her to bring her blood sugar log with her to her appointment tomorrow.    Activities of Daily Living:  X  Independent ? Needs assist (describe; ? home DME used) ? [facility] (describe, ? home DME used)   Community resources in place for patient:  ? None  X  Home Health/Home DME - [place] ? Assisted Living ? Support Group        Questions/Concerns discussed: Confirmed her appointment at [facility] tomorrow, 01/31/16 @ 1000 and with the Financial Counselor @ 1100.  She stated that she will need transportation to the clinic. Jacklynn Lewis, [facility] scheduler notified of the need for a cab. The patient reported no other questions/concerns at this time.     Call placed to [place] [facility] # [phone] to inquire about the status of the home health referral.  Spoke to [facility] who said that they have tried multiple times and have left messages and have not heard back from the patient. She stated that they make only a couple of attempts to reach the patient and then consider them - unable to reach.   Informed her that this CM just spoke to the patient and confirmed the # called.  Nira Conn was appreciative of the information and noted that they would  try to contact the patient again.

## 2016-01-31 ENCOUNTER — Ambulatory Visit: Payer: 59

## 2016-01-31 ENCOUNTER — Telehealth: Payer: Self-pay

## 2016-01-31 ENCOUNTER — Encounter: Payer: Self-pay | Admitting: Family Medicine

## 2016-01-31 ENCOUNTER — Ambulatory Visit: Payer: Self-pay

## 2016-01-31 ENCOUNTER — Ambulatory Visit: Payer: Self-pay | Attending: Family Medicine | Admitting: Family Medicine

## 2016-01-31 VITALS — BP 121/65 | HR 92 | Temp 98.2°F | Ht 62.0 in | Wt 139.8 lb

## 2016-01-31 DIAGNOSIS — Z880 Allergy status to penicillin: Secondary | ICD-10-CM | POA: Insufficient documentation

## 2016-01-31 DIAGNOSIS — Z79899 Other long term (current) drug therapy: Secondary | ICD-10-CM | POA: Insufficient documentation

## 2016-01-31 DIAGNOSIS — IMO0002 Reserved for concepts with insufficient information to code with codable children: Secondary | ICD-10-CM

## 2016-01-31 DIAGNOSIS — F329 Major depressive disorder, single episode, unspecified: Secondary | ICD-10-CM | POA: Insufficient documentation

## 2016-01-31 DIAGNOSIS — E785 Hyperlipidemia, unspecified: Secondary | ICD-10-CM | POA: Insufficient documentation

## 2016-01-31 DIAGNOSIS — F418 Other specified anxiety disorders: Secondary | ICD-10-CM

## 2016-01-31 DIAGNOSIS — I10 Essential (primary) hypertension: Secondary | ICD-10-CM

## 2016-01-31 DIAGNOSIS — E1065 Type 1 diabetes mellitus with hyperglycemia: Secondary | ICD-10-CM

## 2016-01-31 DIAGNOSIS — F419 Anxiety disorder, unspecified: Secondary | ICD-10-CM | POA: Insufficient documentation

## 2016-01-31 DIAGNOSIS — E1010 Type 1 diabetes mellitus with ketoacidosis without coma: Secondary | ICD-10-CM

## 2016-01-31 DIAGNOSIS — K3184 Gastroparesis: Secondary | ICD-10-CM

## 2016-01-31 DIAGNOSIS — Z9889 Other specified postprocedural states: Secondary | ICD-10-CM | POA: Insufficient documentation

## 2016-01-31 DIAGNOSIS — E1043 Type 1 diabetes mellitus with diabetic autonomic (poly)neuropathy: Secondary | ICD-10-CM | POA: Insufficient documentation

## 2016-01-31 DIAGNOSIS — E1040 Type 1 diabetes mellitus with diabetic neuropathy, unspecified: Secondary | ICD-10-CM

## 2016-01-31 DIAGNOSIS — E108 Type 1 diabetes mellitus with unspecified complications: Secondary | ICD-10-CM

## 2016-01-31 DIAGNOSIS — F32A Depression, unspecified: Secondary | ICD-10-CM

## 2016-01-31 DIAGNOSIS — E1143 Type 2 diabetes mellitus with diabetic autonomic (poly)neuropathy: Secondary | ICD-10-CM

## 2016-01-31 DIAGNOSIS — K219 Gastro-esophageal reflux disease without esophagitis: Secondary | ICD-10-CM | POA: Insufficient documentation

## 2016-01-31 DIAGNOSIS — Z888 Allergy status to other drugs, medicaments and biological substances status: Secondary | ICD-10-CM | POA: Insufficient documentation

## 2016-01-31 DIAGNOSIS — Z794 Long term (current) use of insulin: Secondary | ICD-10-CM | POA: Insufficient documentation

## 2016-01-31 LAB — GLUCOSE, POCT (MANUAL RESULT ENTRY): POC Glucose: 257 mg/dl — AB (ref 70–99)

## 2016-01-31 MED ORDER — INSULIN DETEMIR 100 UNIT/ML ~~LOC~~ SOLN: 25.0000 [IU] | Freq: Every day | SUBCUTANEOUS | 3 refills | Status: DC

## 2016-01-31 MED ORDER — ONDANSETRON 4 MG PO TBDP: 4.0000 mg | ORAL_TABLET | Freq: Three times a day (TID) | ORAL | 1 refills | Status: DC | PRN

## 2016-01-31 MED ORDER — BUSPIRONE HCL 5 MG PO TABS: 5.0000 mg | ORAL_TABLET | Freq: Three times a day (TID) | ORAL | 3 refills | Status: DC

## 2016-01-31 MED ORDER — PREGABALIN 150 MG PO CAPS: 150.0000 mg | ORAL_CAPSULE | Freq: Two times a day (BID) | ORAL | 3 refills | Status: DC

## 2016-01-31 MED ORDER — GLUCOSE BLOOD VI STRP: ORAL_STRIP | 5 refills | Status: DC

## 2016-01-31 MED ORDER — INSULIN ASPART 100 UNIT/ML ~~LOC~~ SOLN: 0.0000 [IU] | Freq: Three times a day (TID) | SUBCUTANEOUS | 3 refills | Status: DC

## 2016-01-31 MED ORDER — METOCLOPRAMIDE HCL 5 MG/5ML PO SOLN: 5.0000 mg | Freq: Three times a day (TID) | ORAL | 1 refills | Status: DC

## 2016-01-31 MED FILL — !NOVOLOG 100UNITS/ML VIAL: 100/ML | 22 days supply | Qty: 10 | Fill #0

## 2016-01-31 MED FILL — ?BUSPIRONE HCL 5 MG TABLET: 5 | 30 days supply | Qty: 90 | Fill #0

## 2016-01-31 MED FILL — TRUE METRIX TEST STRIP: 30 days supply | Qty: 100 | Fill #0

## 2016-01-31 MED FILL — !LEVEMIR FLEXPEN 100UNITS/M: 100U/ML (3) | 36 days supply | Qty: 9 | Fill #0

## 2016-01-31 MED FILL — ONDANSETRON ODT 4 MG TABLET: 4 | 13 days supply | Qty: 40 | Fill #0

## 2016-01-31 MED FILL — METOCLOPRAMIDE 5 MG/5 ML SY: 5 | 21 days supply | Qty: 450 | Fill #0

## 2016-01-31 NOTE — Patient Instructions (Signed)
Gastroparesis °Gastroparesis, also called delayed gastric emptying, is a condition in which food takes longer than normal to empty from the stomach. The condition is usually long-lasting (chronic). °CAUSES °This condition may be caused by: °· An endocrine disorder, such as hypothyroidism or diabetes. Diabetes is the most common cause of this condition. °· A nervous system disease, such as Parkinson disease or multiple sclerosis. °· Cancer, infection, or surgery of the stomach or vagus nerve. °· A connective tissue disorder, such as scleroderma. °· Certain medicines. °In most cases, the cause is not known. °RISK FACTORS °This condition is more likely to develop in: °· People with certain disorders, including endocrine disorders, eating disorders, amyloidosis, and scleroderma. °· People with certain diseases, including Parkinson disease or multiple sclerosis. °· People with cancer or infection of the stomach or vagus nerve. °· People who have had surgery on the stomach or vagus nerve. °· People who take certain medicines. °· Women. °SYMPTOMS °Symptoms of this condition include: °· An early feeling of fullness when eating. °· Nausea. °· Weight loss. °· Vomiting. °· Heartburn. °· Abdominal bloating. °· Inconsistent blood glucose levels. °· Lack of appetite. °· Acid from the stomach coming up into the esophagus (gastroesophageal reflux). °· Spasms of the stomach. °Symptoms may come and go. °DIAGNOSIS °This condition is diagnosed with tests, such as: °· Tests that check how long it takes food to move through the stomach and intestines. These tests include: °¨ Upper gastrointestinal (GI) series. In this test, X-rays of the intestines are taken after you drink a liquid. The liquid makes the intestines show up better on the X-rays. °¨ Gastric emptying scintigraphy. In this test, scans are taken after you eat food that contains a small amount of radioactive material. °¨ Wireless capsule GI monitoring system. This test  involves swallowing a capsule that records information about movement through the stomach. °· Gastric manometry. This test measures electrical and muscular activity in the stomach. It is done with a thin tube that is passed down the throat and into the stomach. °· Endoscopy. This test checks for abnormalities in the lining of the stomach. It is done with a long, thin tube that is passed down the throat and into the stomach. °· An ultrasound. This test can help rule out gallbladder disease or pancreatitis as a cause of your symptoms. It uses sound waves to take pictures of the inside of your body. °TREATMENT °There is no cure for gastroparesis. This condition may be managed with: °· Treatment of the underlying condition causing the gastroparesis. °· Lifestyle changes, including exercise and dietary changes. Dietary changes can include: °¨ Changes in what and when you eat. °¨ Eating smaller meals more often. °¨ Eating low-fat foods. °¨ Eating low-fiber forms of high-fiber foods, such as cooked vegetables instead of raw vegetables. °¨ Having liquid foods in place of solid foods. Liquid foods are easier to digest. °· Medicines. These may be given to control nausea and vomiting and to stimulate stomach muscles. °· Getting food through a feeding tube. This may be done in severe cases. °· A gastric neurostimulator. This is a device that is inserted into the body with surgery. It helps improve stomach emptying and control nausea and vomiting. °HOME CARE INSTRUCTIONS °· Follow your health care provider's instructions about exercise and diet. °· Take medicines only as directed by your health care provider. °SEEK MEDICAL CARE IF: °· Your symptoms do not improve with treatment. °· You have new symptoms. °SEEK IMMEDIATE MEDICAL CARE IF: °· You have   severe abdominal pain that does not improve with treatment. °· You have nausea that does not go away. °· You cannot keep fluids down. °  °This information is not intended to replace  advice given to you by your health care provider. Make sure you discuss any questions you have with your health care provider. °  °Document Released: 06/11/2005 Document Revised: 10/26/2014 Document Reviewed: 06/07/2014 °Elsevier Interactive Patient Education ©2016 [employer] ° °

## 2016-01-31 NOTE — Progress Notes (Signed)
Transitional care clinic  Date of telephone encounter: 01/27/16  Hospitalization dates: 01/23/16- 01/26/16  Subjective:    Patient ID: [name], female    DOB: 11/28/1965, 50 y.o.   MRN: [id number]  HPI  She is a 50 year old female with a history of uncontrolled type 1 diabetes mellitus, diabetic neuropathy, diabetic gastroparesis, hypertension, anemia, anxiety and depression, multiple hospitalizations for diabetic gastroparesis with recent hospitalization for diabetic ketoacidosis.  She was seen at [facility] regional ER for hypoglycemia and later presented to [facility] ED with hyperglycemia, nausea and vomiting. She was placed on IV fluids, insulin drip and antiemetics with resulting improvement in her symptoms; electrolytes were replaced and she was subsequently discharged.  She informs me today she feels fine and has been taking 20 units of Levemir rather than 25 units which was prescribed due to fear of hypoglycemia but reports that fasting sugars have been in the 200s. She complains of severe anxiety and has run out of her BuSpar. She thinks her anxiety triggers the gastroparesis symptoms.  Past Medical History:  Diagnosis Date  . Depression   . Diabetes mellitus without complication ([place])   . Essential hypertension   . Gastroparesis   . GERD (gastroesophageal reflux disease)   . HLD (hyperlipidemia)     Past Surgical History:  Procedure Laterality Date  . COLONOSCOPY  09/27/2014   at [facility]  . ESOPHAGOGASTRODUODENOSCOPY  09/27/2014   at [facility] Dr Rolan Lipa. biospy neg for celiac, neg for H pylori.   . EYE SURGERY    . gailstones    . IR GENERIC HISTORICAL  01/24/2016   IR FLUORO GUIDE CV LINE RIGHT 01/24/2016 Darrell K Allred, PA-C [facility]  . IR GENERIC HISTORICAL  01/24/2016   IR US GUIDE VASC ACCESS RIGHT 01/24/2016 Darrell K Allred, PA-C [facility]  . POSTERIOR VITRECTOMY AND MEMBRANE PEEL-LEFT EYE  09/28/2002  . POSTERIOR VITRECTOMY AND MEMBRANE PEEL-RIGHT EYE   03/16/2002  . RETINAL DETACHMENT SURGERY     ] Allergies  Allergen Reactions  . Anesthetics, Amide Nausea And Vomiting  . Penicillins Diarrhea and Nausea And Vomiting    Has patient had a PCN reaction causing immediate rash, facial/tongue/throat swelling, SOB or lightheadedness with hypotension: No Has patient had a PCN reaction causing severe rash involving mucus membranes or skin necrosis: No Has patient had a PCN reaction that required hospitalization No Has patient had a PCN reaction occurring within the last 10 years: Yes  If all of the above answers are "NO", then may proceed with Cephalosporin use.   . Buprenorphine Hcl Rash  . Encainide Nausea And Vomiting    Current Outpatient Prescriptions on File Prior to Visit  Medication Sig Dispense Refill  . albuterol (PROVENTIL HFA;VENTOLIN HFA) 108 (90 Base) MCG/ACT inhaler Inhale 2 puffs into the lungs every 6 (six) hours as needed for wheezing or shortness of breath. 1 Inhaler 0  . Blood Glucose Monitoring Suppl (TRUE METRIX METER) DEVI 1 each by Does not apply route 3 (three) times daily before meals. 1 Device 0  . furosemide (LASIX) 40 MG tablet Take 40 mg by mouth.    . Insulin Syringe-Needle U-100 (BD INSULIN SYRINGE ULTRAFINE) 31G X 15/64" 0.5 ML MISC 1 each by Does not apply route 4 (four) times daily as needed. 120 each 5  . lisinopril (PRINIVIL,ZESTRIL) 20 MG tablet Take 10 mg by mouth daily.     . polyethylene glycol powder (GLYCOLAX/MIRALAX) powder Take 1 Container by mouth daily. Reported on 07/19/2015  No current facility-administered medications on file prior to visit.      Review of Systems  Constitutional: Negative for activity change, appetite change and fatigue.  HENT: Negative for congestion, sinus pressure and sore throat.   Eyes: Negative for visual disturbance.  Respiratory: Negative for cough, chest tightness, shortness of breath and wheezing.   Cardiovascular: Negative for chest pain and palpitations.    Gastrointestinal: Negative for abdominal distention, abdominal pain and constipation.  Endocrine: Negative for polydipsia.  Genitourinary: Negative for dysuria and frequency.  Musculoskeletal: Negative for arthralgias and back pain.  Skin: Negative for rash.  Neurological: Negative for tremors, light-headedness and numbness.  Hematological: Does not bruise/bleed easily.  Psychiatric/Behavioral: Negative for agitation and behavioral problems.       Objective: Vitals:   01/31/16 0952  BP: 121/65  Pulse: 92  Temp: 98.2 F (36.8 C)  TempSrc: Oral  SpO2: 100%  Weight: 139 lb 12.8 oz (63.4 kg)  Height: 5\' 2"  (1.575 m)      Physical Exam  Constitutional: She is oriented to person, place, and time. She appears well-developed and well-nourished.  Cardiovascular: Normal rate, normal heart sounds and intact distal pulses.   No murmur heard. Pulmonary/Chest: Effort normal and breath sounds normal. She has no wheezes. She has no rales. She exhibits no tenderness.  Abdominal: Soft. Bowel sounds are normal. She exhibits no distension and no mass. There is no tenderness.  Musculoskeletal: Normal range of motion.  Neurological: She is alert and oriented to person, place, and time.  Psychiatric: Her mood appears anxious.   CMP Latest Ref Rng & Units 01/26/2016 01/25/2016 01/24/2016  Glucose 65 - 99 mg/dL 123(H) 112(H) 166(H)  BUN 6 - 20 mg/dL 8 9 9   Creatinine 0.44 - 1.00 mg/dL 0.92 1.09(H) 0.91  Sodium 135 - 145 mmol/L 135 138 138  Potassium 3.5 - 5.1 mmol/L 4.5 2.9(L) 3.3(L)  Chloride 101 - 111 mmol/L 112(H) 113(H) 112(H)  CO2 22 - 32 mmol/L 20(L) 20(L) 20(L)  Calcium 8.9 - 10.3 mg/dL 8.8(L) 8.2(L) 8.2(L)  Total Protein 6.5 - 8.1 g/dL - - -  Total Bilirubin 0.3 - 1.2 mg/dL - - -  Alkaline Phos 38 - 126 U/L - - -  AST 15 - 41 U/L - - -  ALT 14 - 54 U/L - - -         Assessment & Plan:  1. Diabetic neuropathy, type I diabetes mellitus ([place]) Remains on Lyrica  2. Anxiety and  depression Continue BuSpar  3. Anemia, iron deficiency  Anemia could be secondary to history of uterine fibroids. Advised to comply with iron pills. Will need pelvic ultrasound after which I will refer her to GYN-advised to maintain with financial department for financial clearance she has no medical coverage  4. Diabetic gastroparesis ([place]) Currently on Reglan Continue Zofran as needed; advised to eat small frequent meals Was previously followed by GI and was on Marinol which she has now stopped  5. Type 1 diabetes mellitus  Uncontrolled with A1c of 9.4 Advised to take 25 units of Levemir ;she has labile blood sugars she has been advised to bring in her blood sugar log at her next visit for review Unable to see endocrine due to lack of medical coverage  This note has been created with Surveyor, quantity. Any transcriptional errors are unintentional.

## 2016-01-31 NOTE — Telephone Encounter (Signed)
Met with the patient when she was in the clinic today. She stated that she will be meeting with Financial Counseling after her appointment with Dr Jarold Song and she will need a list of the information that is needed for the [place] as she misplaced the list that had been given to her.   She also noted that [place] called her yesterday afternoon and informed her that the home health nurse would be calling her within 48 hours to schedule a home visit.

## 2016-02-01 ENCOUNTER — Ambulatory Visit: Payer: 59 | Admitting: Family Medicine

## 2016-02-01 ENCOUNTER — Telehealth: Payer: Self-pay | Admitting: Family Medicine

## 2016-02-01 NOTE — Telephone Encounter (Signed)
Rep from [facility] called to speak with doctor regarding pt's referral for diabetes class. Needs verbal approval. She also needs a clarification on amount of insulin that patient needs. Please follow up.  Thank you, Venetia Night.

## 2016-02-02 IMAGING — CR DG ABDOMEN ACUTE W/ 1V CHEST
3 series · 3 of 3 positions shown · non-contrast
Comparison: Chest and abdominal radiographs performed 04/04/2015

CLINICAL DATA: Acute onset of generalized abdominal pain, nausea
and vomiting. Initial encounter.

EXAM:
DG ABDOMEN ACUTE W/ 1V CHEST

[w chest pa]
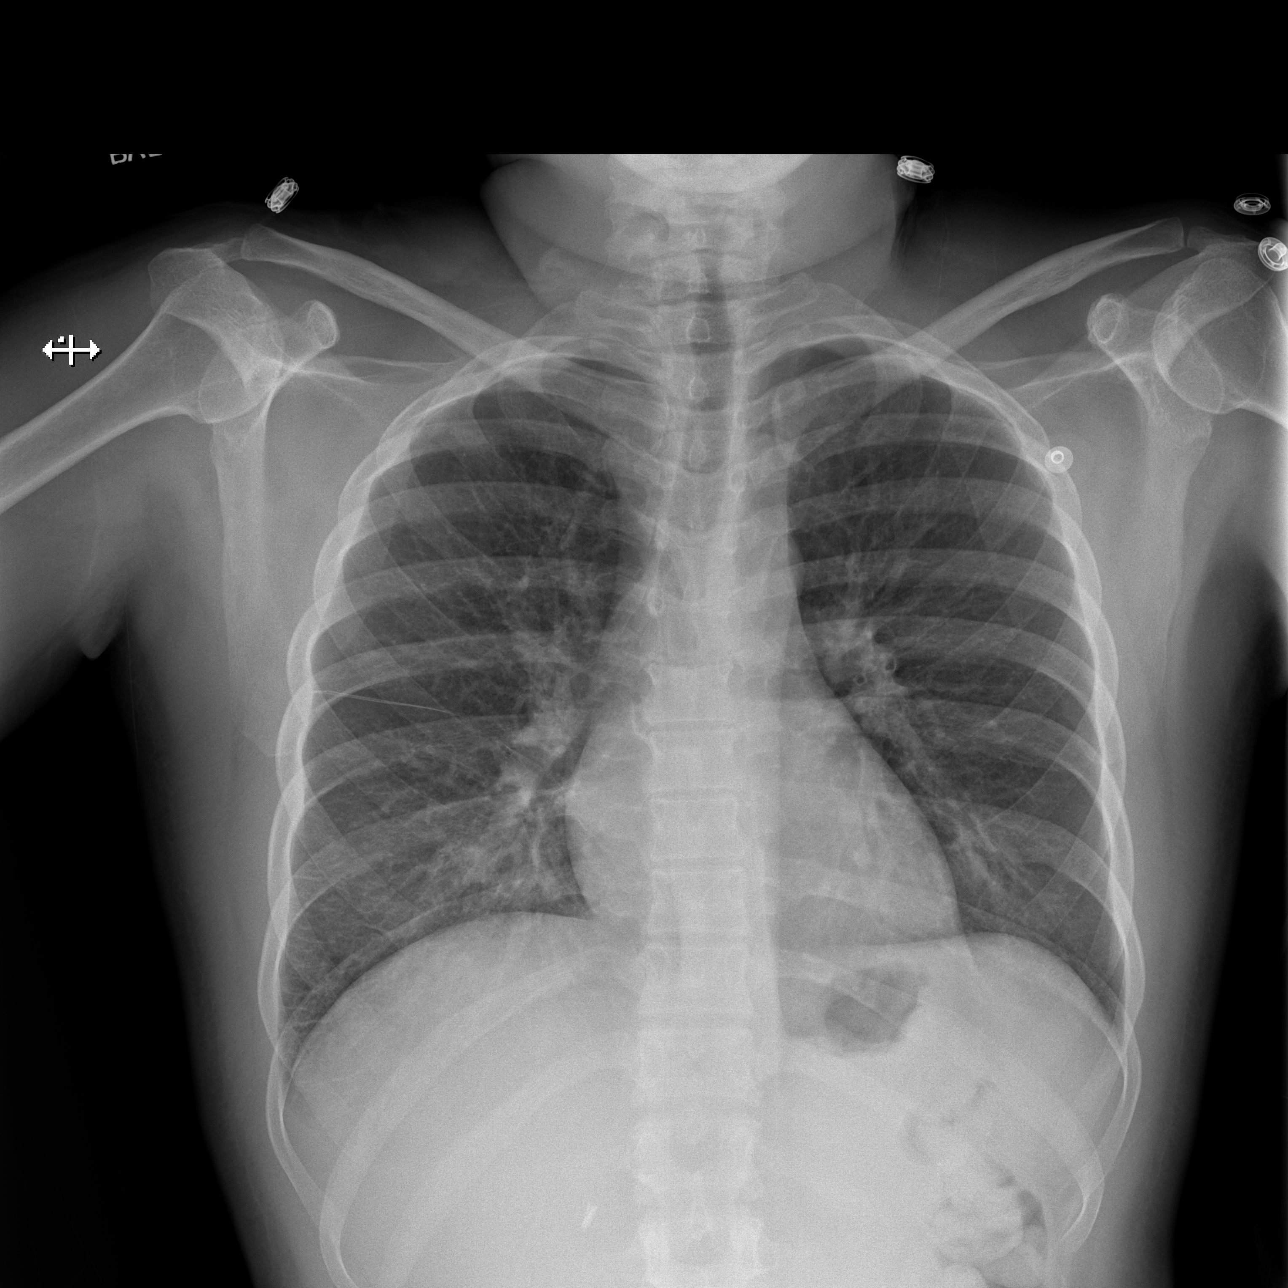

[w abdomen upright]
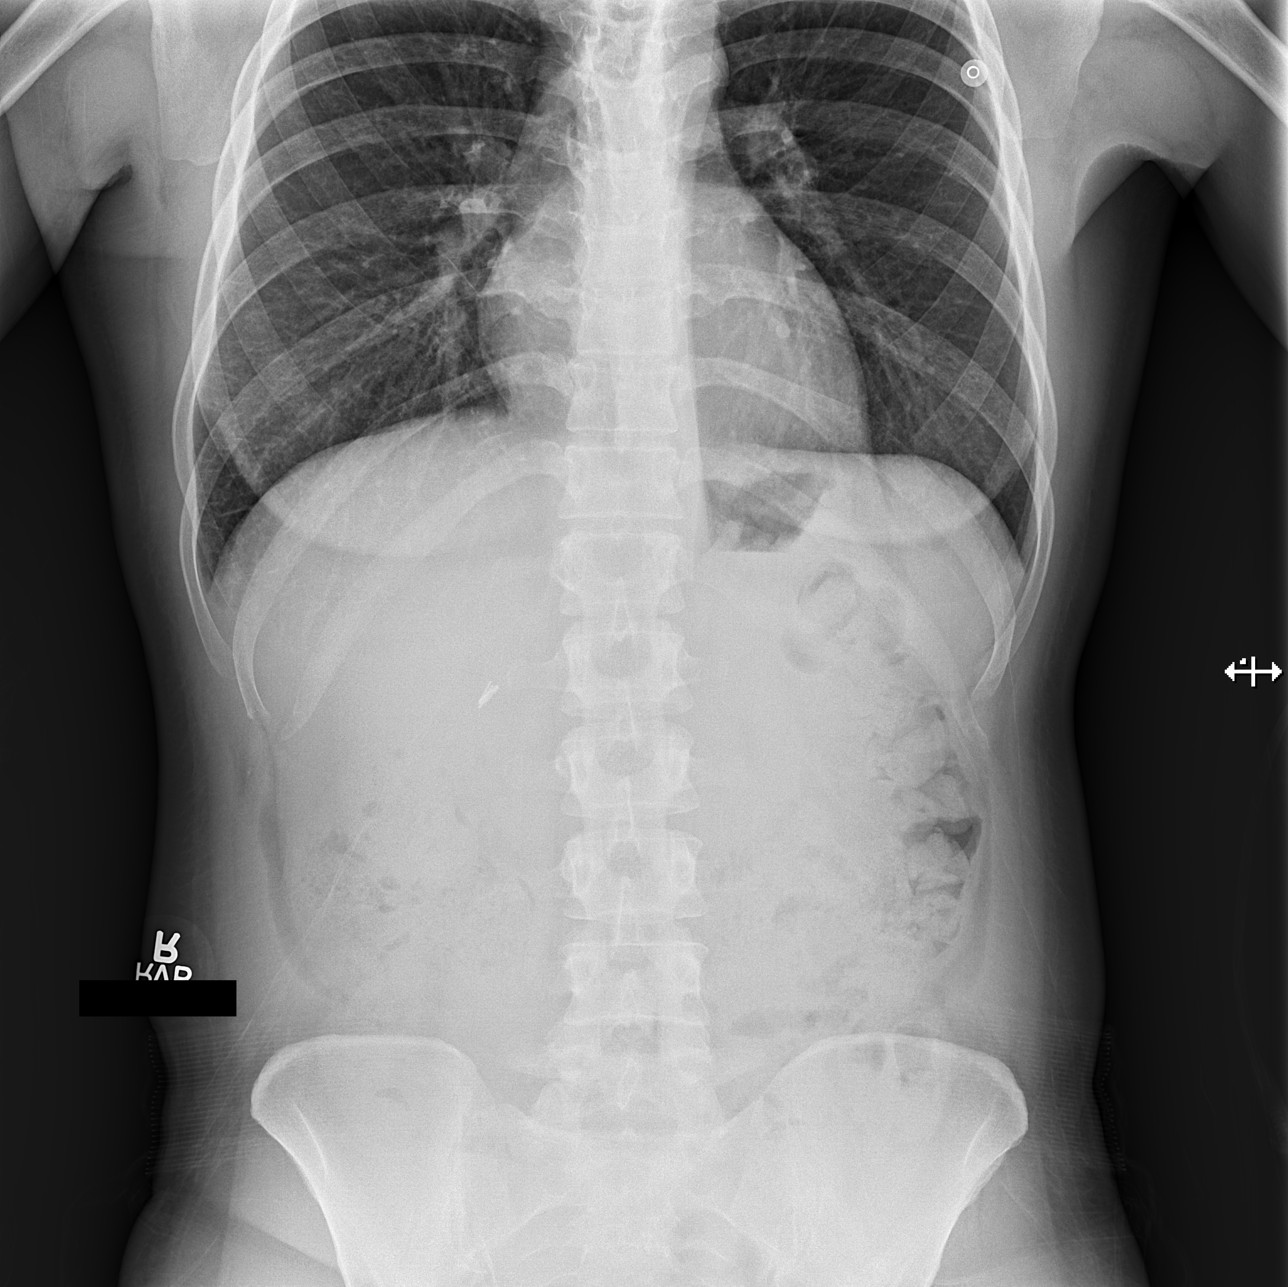

[x abdomen supine]
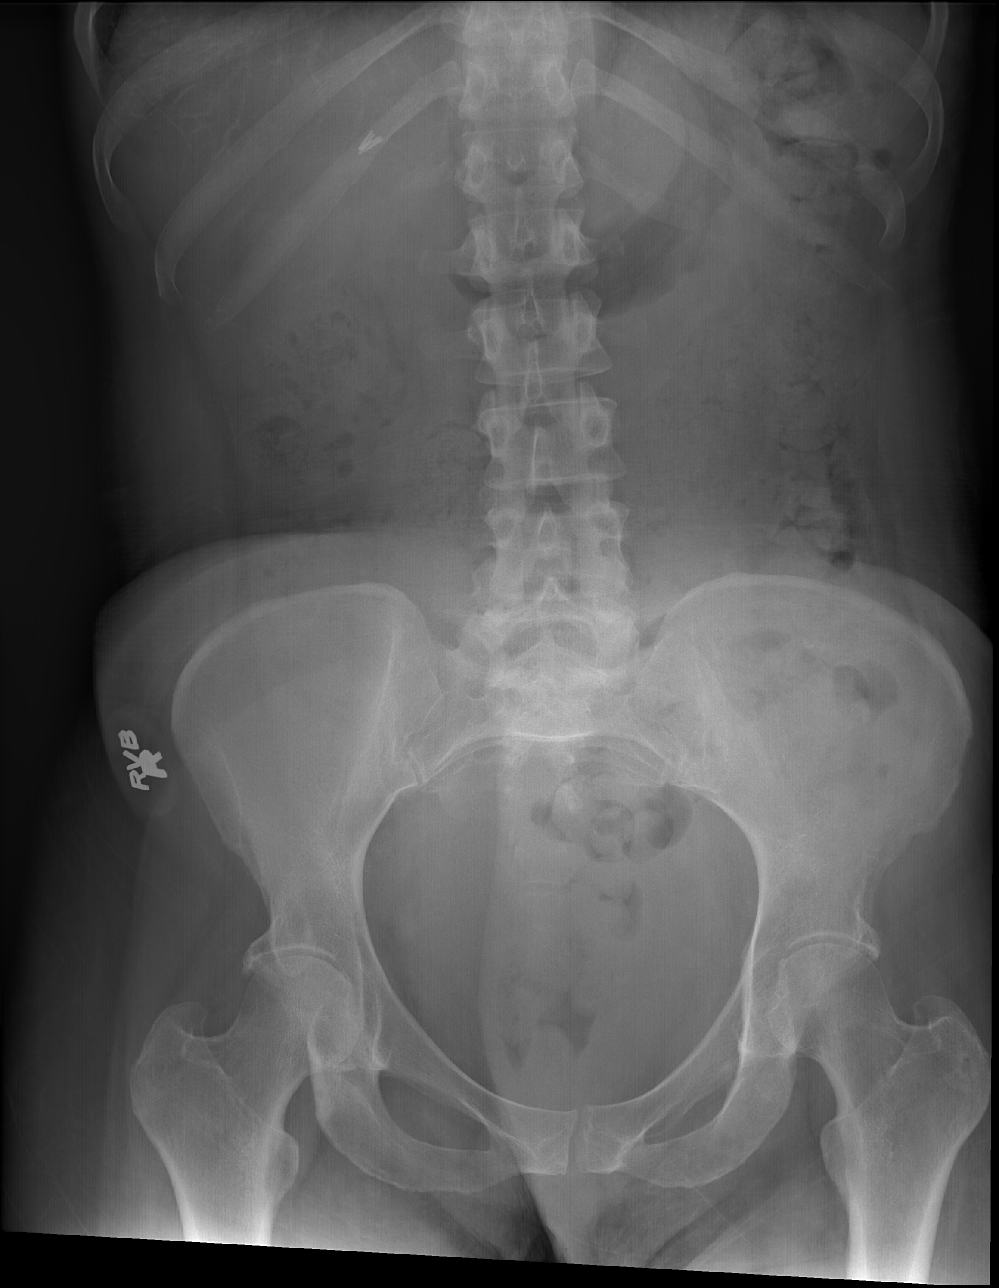

[3 of 3 positions shown; findings below may reference images not displayed]

FINDINGS: The lungs are well-aerated. Mild peribronchial thickening is noted.
There is no evidence of focal opacification, pleural effusion or
pneumothorax. The cardiomediastinal silhouette is within normal
limits.

The visualized bowel gas pattern is unremarkable. Scattered stool
and air are seen within the colon; there is no evidence of small
bowel dilatation to suggest obstruction. No free intra-abdominal air
is identified on the provided upright view. Clips are noted within
the right upper quadrant, reflecting prior cholecystectomy.

No acute osseous abnormalities are seen; the sacroiliac joints are
unremarkable in appearance.
IMPRESSION: 1. Unremarkable bowel gas pattern; no free intra-abdominal air seen.
Small amount of stool noted in the colon.
2. Mild peribronchial thickening noted.  Lungs otherwise clear.

## 2016-02-02 NOTE — Telephone Encounter (Signed)
Ok to participate in Diabetes class. Correct dose of insulin is documented both in my plan and on med list from last office visit.

## 2016-02-02 NOTE — Telephone Encounter (Signed)
Writer spoke with Briant Cedar from [place] per Dr. Jarold Song stating that MD is OK with patient participating in diabetic classes.  A fax was provided to [facility] clarifying patient's Novolg Sliding Scale;  Blood sugars.......................................Marland KitchenInsulin  0-150 ....................................................Marland Kitchen0 units 151-200 .................................................2 units 201-250..................................................4 units 251-300.................................................. 6 units 301-350...................................................8 units 357-400...................................................10 units Above 400 .............................................Marland Kitchen 12 units   Patient is to take 2 units of levemir at bedtime.

## 2016-02-07 ENCOUNTER — Telehealth: Payer: Self-pay

## 2016-02-07 NOTE — Telephone Encounter (Signed)
Writer verbally gave [name] from [place] Dr. Johna Sheriff sliding scale for Novolog;  Blood sugars                                                  Insulin (units)  0-150                                                                0 units  151-200                                                            2 units  201-250                                                            4 units  251-300                                                            6 units  301-350                                                            8 units  351-400                                                           10 units  Above 400                                                        12 units

## 2016-02-07 NOTE — Telephone Encounter (Signed)
Call placed to the patient to check on her status, She stated that she is doing "okay" and denied any nausea/vomiting.  She also stated that her blood sugars have been " okay."  She said that she is still waiting for the sliding scale for her novolog.  She noted that the home health nurse has not called her this week.  She also said that the home health SW met with her and she found that to be " helpful" but did not comment on what was discussed. The patient said that she is still working on collecting the documents needed for the orange card.  No other concerns/questions reported at this time.    Call placed to [place] and spoke to Marley. She said that the patient's nurse, Janett Billow, was on the phone and not able to talk. Explained to [place] that the patient is wondering when her nurse is coming and can bring a copy of the sliding scale orders. Izora Gala stated that the patient is on the schedule to be seen today and she would have Janett Billow call her to let her know what time she is planning to visit.

## 2016-02-10 ENCOUNTER — Telehealth: Payer: Self-pay | Admitting: Family Medicine

## 2016-02-10 NOTE — Telephone Encounter (Signed)
Writer called Beth back from [place].  She wanted MD to be aware of the diabetic sliding scale that patient has been following:  Blood sugar                                            Insulin   75- 179                                                      6 units  180-224                                                     8 units  225-279                                                     10 units  280-300                                                      12 units  301-400                                                      14 units  Anything above 400                                    15 units   Dr. Jarold Song aware of sliding scale.  Patient has appt in 2 weeks at which time this will be addressed. Beth also stated that according to patient's blood sugar log she has been running between "the upper 100's and lower 200's".

## 2016-02-10 NOTE — Telephone Encounter (Signed)
[name] from [employer] called office to speak with nurse regarding patient. She stated that [name] has been using a different sliding scale insulin and will be going to see them in two weeks. Please follow up with [facility], she has more detailed info to give to PCP [phone]).  Thank you.

## 2016-02-15 MED FILL — METOCLOPRAMIDE 5 MG/5 ML SY: 5 | 21 days supply | Qty: 450 | Fill #1

## 2016-02-17 ENCOUNTER — Telehealth: Payer: Self-pay | Admitting: Family Medicine

## 2016-02-17 NOTE — Telephone Encounter (Signed)
Megan from [facility] calling to inform PCP that pt missed her skilled nursing visit   Nurse states she called pt last night, this morning, and went by pts house this afternoon and received no response from pt

## 2016-02-18 ENCOUNTER — Emergency Department (HOSPITAL_COMMUNITY)
Admission: EM | Admit: 2016-02-18 | Discharge: 2016-02-18 | Disposition: A | Discharge status: Home / Self Care | Payer: 59 | Attending: Emergency Medicine | Admitting: Emergency Medicine

## 2016-02-18 ENCOUNTER — Encounter (HOSPITAL_COMMUNITY): Payer: Self-pay | Admitting: Emergency Medicine

## 2016-02-18 DIAGNOSIS — R739 Hyperglycemia, unspecified: Secondary | ICD-10-CM

## 2016-02-18 DIAGNOSIS — R1013 Epigastric pain: Secondary | ICD-10-CM

## 2016-02-18 DIAGNOSIS — R112 Nausea with vomiting, unspecified: Secondary | ICD-10-CM | POA: Insufficient documentation

## 2016-02-18 DIAGNOSIS — I10 Essential (primary) hypertension: Secondary | ICD-10-CM | POA: Insufficient documentation

## 2016-02-18 DIAGNOSIS — Z79899 Other long term (current) drug therapy: Secondary | ICD-10-CM | POA: Insufficient documentation

## 2016-02-18 DIAGNOSIS — E1065 Type 1 diabetes mellitus with hyperglycemia: Secondary | ICD-10-CM | POA: Insufficient documentation

## 2016-02-18 DIAGNOSIS — E1010 Type 1 diabetes mellitus with ketoacidosis without coma: Secondary | ICD-10-CM | POA: Insufficient documentation

## 2016-02-18 LAB — URINE MICROSCOPIC-ADD ON
Bacteria, UA: NONE SEEN
RBC / HPF: NONE SEEN RBC/hpf (ref 0–5)
WBC UA: NONE SEEN WBC/hpf (ref 0–5)

## 2016-02-18 LAB — COMPREHENSIVE METABOLIC PANEL
ALBUMIN: 4.3 g/dL (ref 3.5–5.0)
ALK PHOS: 93 U/L (ref 38–126)
ALT: 19 U/L (ref 14–54)
AST: 23 U/L (ref 15–41)
Anion gap: 13 (ref 5–15)
BILIRUBIN TOTAL: 0.8 mg/dL (ref 0.3–1.2)
BUN: 17 mg/dL (ref 6–20)
CHLORIDE: 102 mmol/L (ref 101–111)
CO2: 19 mmol/L — ABNORMAL LOW (ref 22–32)
CREATININE: 1.12 mg/dL — AB (ref 0.44–1.00)
Calcium: 9.6 mg/dL (ref 8.9–10.3)
GFR calc Af Amer: >60 mL/min (ref >60)
GFR calc non Af Amer: 57 mL/min — ABNORMAL LOW (ref >60)
GLUCOSE: 389 mg/dL — AB (ref 65–99)
POTASSIUM: 4.6 mmol/L (ref 3.5–5.1)
Sodium: 134 mmol/L — ABNORMAL LOW (ref 135–145)
Total Protein: 8 g/dL (ref 6.5–8.1)

## 2016-02-18 LAB — URINALYSIS, ROUTINE W REFLEX MICROSCOPIC
Bilirubin Urine: NEGATIVE
Glucose, UA: >1000 mg/dL — AB
Hgb urine dipstick: NEGATIVE
Ketones, ur: >80 mg/dL — AB
LEUKOCYTES UA: NEGATIVE
Nitrite: NEGATIVE
PROTEIN: NEGATIVE mg/dL
Specific Gravity, Urine: 1.026 (ref 1.005–1.030)
pH: 5.5 (ref 5.0–8.0)

## 2016-02-18 LAB — CBC
HEMATOCRIT: 33.3 % — AB (ref 36.0–46.0)
Hemoglobin: 10.3 g/dL — ABNORMAL LOW (ref 12.0–15.0)
MCH: 22.3 pg — ABNORMAL LOW (ref 26.0–34.0)
MCHC: 30.9 g/dL (ref 30.0–36.0)
MCV: 72.1 fL — AB (ref 78.0–100.0)
PLATELETS: 410 10*3/uL — AB (ref 150–400)
RBC: 4.62 MIL/uL (ref 3.87–5.11)
RDW: 16 % — AB (ref 11.5–15.5)
WBC: 7.3 10*3/uL (ref 4.0–10.5)

## 2016-02-18 LAB — CBG MONITORING, ED
GLUCOSE-CAPILLARY: 473 mg/dL — AB (ref 65–99)
GLUCOSE-CAPILLARY: 284 mg/dL — AB (ref 65–99)
Glucose-Capillary: 360 mg/dL — ABNORMAL HIGH (ref 65–99)
Glucose-Capillary: 338 mg/dL — ABNORMAL HIGH (ref 65–99)

## 2016-02-18 LAB — LIPASE, BLOOD: Lipase: 28 U/L (ref 11–51)

## 2016-02-18 MED ORDER — HALOPERIDOL LACTATE 5 MG/ML IJ SOLN
2.0000 mg | Freq: Once | INTRAMUSCULAR | Status: AC
  Administered 2016-02-18: 2 mg via INTRAMUSCULAR
  Filled 2016-02-18: qty 1

## 2016-02-18 MED ORDER — SODIUM CHLORIDE 0.9 % IV BOLUS (SEPSIS)
1000.0000 mL | Freq: Once | INTRAVENOUS | Status: AC
  Administered 2016-02-18: 1000 mL via INTRAVENOUS

## 2016-02-18 MED ORDER — LORAZEPAM 2 MG/ML IJ SOLN
1.0000 mg | Freq: Once | INTRAMUSCULAR | Status: AC
  Administered 2016-02-18: 1 mg via INTRAVENOUS
  Filled 2016-02-18: qty 1

## 2016-02-18 MED ORDER — ONDANSETRON 4 MG PO TBDP
4.0000 mg | ORAL_TABLET | Freq: Once | ORAL | Status: AC | PRN
  Administered 2016-02-18: 4 mg via ORAL
  Filled 2016-02-18: qty 1

## 2016-02-18 MED ORDER — SODIUM CHLORIDE 0.9 % IV BOLUS (SEPSIS)
500.0000 mL | Freq: Once | INTRAVENOUS | Status: AC
  Administered 2016-02-18: 500 mL via INTRAVENOUS

## 2016-02-18 MED ORDER — INSULIN ASPART 100 UNIT/ML ~~LOC~~ SOLN
5.0000 [IU] | Freq: Once | SUBCUTANEOUS | Status: AC
  Administered 2016-02-18: 5 [IU] via SUBCUTANEOUS
  Filled 2016-02-18: qty 1

## 2016-02-18 MED ORDER — INSULIN ASPART 100 UNIT/ML ~~LOC~~ SOLN
10.0000 [IU] | Freq: Once | SUBCUTANEOUS | Status: AC
  Administered 2016-02-18: 10 [IU] via INTRAVENOUS
  Filled 2016-02-18: qty 1

## 2016-02-18 NOTE — ED Notes (Signed)
Iv team with patient.

## 2016-02-18 NOTE — Discharge Instructions (Signed)
Please read and follow all provided instructions.  Your diagnoses today include:  1. Epigastric abdominal pain     Tests peformed today include: Vital signs. See below for your results today.   Medications prescribed:  Take as prescribed   Home care instructions:  Follow any educational materials contained in this packet.  Follow-up instructions: Please follow-up with your primary care provider for further evaluation of symptoms and treatment   Return instructions:  Please return to the Emergency Department if you do not get better, if you get worse, or new symptoms OR  - Fever (temperature greater than 101.62F)  - Bleeding that does not stop with holding pressure to the area    -Severe pain (please note that you may be more sore the day after your accident)  - Chest Pain  - Difficulty breathing  - Severe nausea or vomiting  - Inability to tolerate food and liquids  - Passing out  - Skin becoming red around your wounds  - Change in mental status (confusion or lethargy)  - New numbness or weakness    Please return if you have any other emergent concerns.  Additional Information:  Your vital signs today were: BP 155/67 (BP Location: Right Arm)    Pulse 111    Temp 98.7 F (37.1 C) (Oral)    Resp (!) 27    Ht 5\' 2"  (1.575 m)    Wt 64.9 kg    LMP 01/15/2016 (Exact Date)    SpO2 97%    BMI 26.16 kg/m  If your blood pressure (BP) was elevated above 135/85 this visit, please have this repeated by your doctor within one month. ---------------

## 2016-02-18 NOTE — ED Notes (Signed)
Pt was nausea at discharge.  PA informed and pt was instructed to take her Regalen she has at home. Pt was taken out of the department with wheelchair where her son took her home.  No reaction to medication noted at discharge.

## 2016-02-18 NOTE — ED Notes (Signed)
EKG given to EDP,Campos,MD., for review. 

## 2016-02-18 NOTE — ED Notes (Signed)
Pt. CBG 360,RN, Raquel Sarna made aware.

## 2016-02-18 NOTE — ED Triage Notes (Signed)
Patient BIB [facility] from home. EMS reports patient has had n/v and abdominal pain since 1800. Patient was able to eat small amount for lunch but unable to keep dinner down. Patient states she vomited 4-5 times. Patient did take her insulin before dinner. EMS reports cbg of 248. Patient diaphoretic, but states she is cold. Temp wnl

## 2016-02-18 NOTE — ED Notes (Signed)
Pt informed she is to be discharged.  Pt states she is tired but she will have to call to try to find a ride.

## 2016-02-18 NOTE — ED Provider Notes (Signed)
[place] DEPT Provider Note   CSN: [id number] Arrival date & time: 02/18/16  0032  By signing my name below, I, [name], attest that this documentation has been prepared under the direction and in the presence of Shary Decamp, PA-C.  Electronically Signed: [name], ED Scribe. 02/18/16. 1:38 AM.  History   Chief Complaint Chief Complaint  Patient presents with  . Abdominal Pain  . Emesis   The history is provided by the patient. No language interpreter was used.   HPI Comments: [name] is a 50 y.o. female BIB PTAR, with a PMhx of DM, HTN, Gastroparesis, GERD, HLD, anemia, and DKA, who presents to the Emergency Department complaining of gradual onset, gradually worsening, constant, 9/10 upper abdominal pain onset ~7.5 hours ago. Pt reports associated nausea, vomiting, and chills, since the onset of her abdominal pain. She reports that she has a hx of Gastroparesis and her pain and symptoms today are similar. Her last flare up was ~1 month ago. She was followed by [facility] for this problem in the past, and takes Reglan at home daily. She has taken Pepto Bismol at home with minimal relief of her symptoms. Her nausea and emesis are exacerbated after episodes of eating foods or drinking fluids. She has had decreased food and fluid intake since the osnet of her symptoms. EMS reported her CBG to be 248 en route. She has a PSHx of cholecystectomy. Denies diarrhea, dysuria, vaginal discharge, vaginal bleeding, fever, CP, SOB, or any other associated symptoms.   Past Medical History:  Diagnosis Date  . Depression   . Diabetes mellitus without complication ([place])   . Essential hypertension   . Gastroparesis   . GERD (gastroesophageal reflux disease)   . HLD (hyperlipidemia)    Patient Active Problem List   Diagnosis Date Noted  . Essential hypertension 01/24/2016  . Leukocytosis 01/24/2016  . Prolonged QT interval 11/27/2015  . Abnormal ECG 11/27/2015  . HLD  (hyperlipidemia)   . GERD (gastroesophageal reflux disease)   . Metabolic acidosis, normal anion gap (NAG)   . AKI (acute kidney injury) ([place])   . Anemia, iron deficiency   . DKA, type 1, not at goal [facility])   . Hyponatremia 09/02/2015  . Vitamin B12 deficiency 08/16/2015  . Lactic acidosis 07/29/2015  . Acute pyelonephritis 07/29/2015  . Pyelonephritis   . Hypothermia   . Diabetic gastroparesis ([place])   . Diabetic neuropathy, type I diabetes mellitus ([place]) 05/18/2015  . Anemia 05/18/2015  . Anxiety and depression 05/18/2015  . Nausea & vomiting   . Diabetic ketoacidosis without coma associated with type 1 diabetes mellitus ([place])   . High anion gap metabolic acidosis [id number]   Past Surgical History:  Procedure Laterality Date  . COLONOSCOPY  09/27/2014   at [facility]  . ESOPHAGOGASTRODUODENOSCOPY  09/27/2014   at [facility], Dr Rolan Lipa. biospy neg for celiac, neg for H pylori.   . EYE SURGERY    . gailstones    . IR GENERIC HISTORICAL  01/24/2016   IR FLUORO GUIDE CV LINE RIGHT 01/24/2016 Darrell K Allred, PA-C [facility]  . IR GENERIC HISTORICAL  01/24/2016   IR US GUIDE VASC ACCESS RIGHT 01/24/2016 Darrell K Allred, PA-C [facility]  . POSTERIOR VITRECTOMY AND MEMBRANE PEEL-LEFT EYE  09/28/2002  . POSTERIOR VITRECTOMY AND MEMBRANE PEEL-RIGHT EYE  03/16/2002  . RETINAL DETACHMENT SURGERY     OB History    No data available     Home Medications  Prior to Admission medications   Medication Sig Start Date End Date Taking? Authorizing Provider  albuterol (PROVENTIL HFA;VENTOLIN HFA) 108 (90 Base) MCG/ACT inhaler Inhale 2 puffs into the lungs every 6 (six) hours as needed for wheezing or shortness of breath. 10/20/15   [name], MD  Blood Glucose Monitoring Suppl (TRUE METRIX METER) DEVI 1 each by Does not apply route 3 (three) times daily before meals. 07/19/15   [name], MD  busPIRone (BUSPAR) 5 MG tablet Take 1 tablet (5 mg total) by mouth 3 (three) times daily. 01/31/16    [name], MD  furosemide (LASIX) 40 MG tablet Take 40 mg by mouth.    Historical Provider, MD  glucose blood (TRUE METRIX BLOOD GLUCOSE TEST) test strip Use 3 times daily before meals 01/31/16   [name], MD  insulin aspart (NOVOLOG) 100 UNIT/ML injection Inject 0-15 Units into the skin 3 (three) times daily with meals. Per sliding scale 01/31/16   [name], MD  insulin detemir (LEVEMIR) 100 UNIT/ML injection Inject 0.25 mLs (25 Units total) into the skin at bedtime. 01/31/16   [name], MD  Insulin Syringe-Needle U-100 (BD INSULIN SYRINGE ULTRAFINE) 31G X 15/64" 0.5 ML MISC 1 each by Does not apply route 4 (four) times daily as needed. 08/16/15   [name], MD  lisinopril (PRINIVIL,ZESTRIL) 20 MG tablet Take 10 mg by mouth daily.     Historical Provider, MD  metoCLOPramide (REGLAN) 5 MG/5ML solution Take 5 mLs (5 mg total) by mouth 4 (four) times daily -  before meals and at bedtime. 01/31/16   [name], MD  ondansetron (ZOFRAN ODT) 4 MG disintegrating tablet Take 1 tablet (4 mg total) by mouth every 8 (eight) hours as needed for nausea. 01/31/16   [name], MD  polyethylene glycol powder (GLYCOLAX/MIRALAX) powder Take 1 Container by mouth daily. Reported on 07/19/2015 04/06/15   Historical Provider, MD  pregabalin (LYRICA) 150 MG capsule Take 1 capsule (150 mg total) by mouth 2 (two) times daily. 01/31/16   [name], MD   Family History Family History  Problem Relation Age of Onset  . Cystic fibrosis Mother   . Hypertension Father   . Diabetes Brother   . Hypertension Maternal Grandmother    Social History Social History  Substance Use Topics  . Smoking status: Never Smoker  . Smokeless tobacco: Never Used  . Alcohol use No   Allergies   Anesthetics, amide; Penicillins; Buprenorphine hcl; and Encainide  Review of Systems Review of Systems A complete 10 system review of systems was obtained and all systems are negative except as noted in the HPI and PMH.   Physical  Exam Updated Vital Signs BP 156/71 (BP Location: Left Arm)   Pulse 110   Temp 98.7 F (37.1 C) (Oral)   Resp 18   Ht 5\' 2"  (1.575 m)   Wt 143 lb (64.9 kg)   LMP 01/15/2016 (Exact Date)   SpO2 98%   BMI 26.16 kg/m   Physical Exam  Constitutional: She appears well-developed and well-nourished.  Actively vomiting non-bilous material on exam.   HENT:  Head: Normocephalic.  Eyes: Conjunctivae are normal.  Neck: Normal range of motion. Neck supple.  Cardiovascular: Normal rate, regular rhythm, normal heart sounds and intact distal pulses.   No murmur heard. Pulmonary/Chest: Effort normal and breath sounds normal. No respiratory distress. She has no wheezes. She has no rales. She exhibits no tenderness.  Abdominal: Soft. Normal appearance. She exhibits no distension. There is  tenderness (epigastric) in the epigastric area. There is no rigidity, no rebound, no guarding, no CVA tenderness, no tenderness at McBurney's point and negative Murphy's sign.  Musculoskeletal: Normal range of motion.  Neurological: She is alert.  Skin: Skin is warm. She is diaphoretic.  Psychiatric: She has a normal mood and affect. Her behavior is normal.  Nursing note and vitals reviewed.  ED Treatments / Results  DIAGNOSTIC STUDIES: Oxygen Saturation is 98% on RA, normal by my interpretation.   COORDINATION OF CARE: 1:38 AM-Discussed next steps with pt. Pt verbalized understanding and is agreeable with the plan.   Labs (all labs ordered are listed, but only abnormal results are displayed) Labs Reviewed  COMPREHENSIVE METABOLIC PANEL - Abnormal; Notable for the following:       Result Value   Sodium 134 (*)    CO2 19 (*)    Glucose, Bld 389 (*)    Creatinine, Ser 1.12 (*)    GFR calc non Af Amer 57 (*)    All other components within normal limits  CBC - Abnormal; Notable for the following:    Hemoglobin 10.3 (*)    HCT 33.3 (*)    MCV 72.1 (*)    MCH 22.3 (*)    RDW 16.0 (*)    Platelets 410  (*)    All other components within normal limits  CBG MONITORING, ED - Abnormal; Notable for the following:    Glucose-Capillary 360 (*)    All other components within normal limits  CBG MONITORING, ED - Abnormal; Notable for the following:    Glucose-Capillary 473 (*)    All other components within normal limits  LIPASE, BLOOD  URINALYSIS, ROUTINE W REFLEX MICROSCOPIC (NOT AT [facility])   EKG  EKG Interpretation None      Radiology No results found.  Procedures Procedures (including critical care time)  Medications Ordered in ED Medications  ondansetron (ZOFRAN-ODT) disintegrating tablet 4 mg (4 mg Oral Given 02/18/16 0228)  sodium chloride 0.9 % bolus 1,000 mL (1,000 mLs Intravenous New Bag/Given 02/18/16 0403)  LORazepam (ATIVAN) injection 1 mg (1 mg Intravenous Given 02/18/16 0401)  haloperidol lactate (HALDOL) injection 2 mg (2 mg Intramuscular Given 02/18/16 0428)  sodium chloride 0.9 % bolus 1,000 mL (1,000 mLs Intravenous New Bag/Given 02/18/16 0453)   Initial Impression / Assessment and Plan / ED Course  I have reviewed the triage vital signs and the nursing notes.  Pertinent labs & imaging results that were available during my care of the patient were reviewed by me and considered in my medical decision making (see chart for details).  Clinical Course    Final Clinical Impressions(s) / ED Diagnoses  I have reviewed and evaluated the relevant laboratory values. I have reviewed the relevant previous healthcare records. I obtained HPI from historian. Patient discussed with supervising physician  ED Course:  Assessment: Pt is a 49yF with hx DM, Gastroparesis who presents with N/V and epigastric abdominal pain with onset today. Hx same. On exam, pt in NAD. Nontoxic/nonseptic appearing. VSS. Afebrile. Lungs CTA. Heart RRR. Abdomen TTP epigastrium. Pt without gallbladder. Labs reassuring and unremarkable. No anion gap. UA pending. Given fluids and antiemetics and insulin in ED.  Symptoms improved in ED. Pt sleeping on reexamination without active dry heaving. Plan is to [place] with follow up to PCP. At time of discharge, Patient is in no acute distress. Vital Signs are stable. Patient is able to ambulate. Patient able to tolerate PO.    Disposition/Plan:  6:13 AM- Sign out to Domenic Moras, PA-C Pending UA Control Hyperglycemia  Pt agrees with DC home afterwards and follow up to PCP   Supervising Physician Jola Schmidt, MD   Final diagnoses:  Epigastric abdominal pain  Hyperglycemia    New Prescriptions New Prescriptions   No medications on file    I personally performed the services described in this documentation, which was scribed in my presence. The recorded information has been reviewed and is accurate.     Shary Decamp, PA-C 02/18/16 [place], MD 02/18/16 [phone]

## 2016-02-19 ENCOUNTER — Encounter (HOSPITAL_COMMUNITY): Payer: Self-pay | Admitting: Emergency Medicine

## 2016-02-19 ENCOUNTER — Inpatient Hospital Stay (HOSPITAL_COMMUNITY)
Admission: EM | Admit: 2016-02-19 | Discharge: 2016-02-21 | DRG: 638 | Disposition: A | Discharge status: Home / Self Care | Payer: 59 | Attending: Internal Medicine | Admitting: Internal Medicine

## 2016-02-19 DIAGNOSIS — E785 Hyperlipidemia, unspecified: Secondary | ICD-10-CM | POA: Diagnosis present

## 2016-02-19 DIAGNOSIS — Z888 Allergy status to other drugs, medicaments and biological substances status: Secondary | ICD-10-CM

## 2016-02-19 DIAGNOSIS — Z880 Allergy status to penicillin: Secondary | ICD-10-CM

## 2016-02-19 DIAGNOSIS — E1010 Type 1 diabetes mellitus with ketoacidosis without coma: Secondary | ICD-10-CM | POA: Diagnosis not present

## 2016-02-19 DIAGNOSIS — I10 Essential (primary) hypertension: Secondary | ICD-10-CM | POA: Diagnosis present

## 2016-02-19 DIAGNOSIS — E1040 Type 1 diabetes mellitus with diabetic neuropathy, unspecified: Secondary | ICD-10-CM | POA: Diagnosis present

## 2016-02-19 DIAGNOSIS — I4581 Long QT syndrome: Secondary | ICD-10-CM | POA: Diagnosis present

## 2016-02-19 DIAGNOSIS — Z79899 Other long term (current) drug therapy: Secondary | ICD-10-CM

## 2016-02-19 DIAGNOSIS — E1143 Type 2 diabetes mellitus with diabetic autonomic (poly)neuropathy: Secondary | ICD-10-CM | POA: Diagnosis present

## 2016-02-19 DIAGNOSIS — Z794 Long term (current) use of insulin: Secondary | ICD-10-CM

## 2016-02-19 DIAGNOSIS — E860 Dehydration: Secondary | ICD-10-CM | POA: Diagnosis present

## 2016-02-19 DIAGNOSIS — R112 Nausea with vomiting, unspecified: Secondary | ICD-10-CM | POA: Diagnosis present

## 2016-02-19 DIAGNOSIS — N179 Acute kidney failure, unspecified: Secondary | ICD-10-CM | POA: Diagnosis present

## 2016-02-19 DIAGNOSIS — Z8249 Family history of ischemic heart disease and other diseases of the circulatory system: Secondary | ICD-10-CM

## 2016-02-19 DIAGNOSIS — F419 Anxiety disorder, unspecified: Secondary | ICD-10-CM | POA: Diagnosis present

## 2016-02-19 DIAGNOSIS — F32A Depression, unspecified: Secondary | ICD-10-CM | POA: Diagnosis present

## 2016-02-19 DIAGNOSIS — K3184 Gastroparesis: Secondary | ICD-10-CM | POA: Diagnosis present

## 2016-02-19 DIAGNOSIS — F418 Other specified anxiety disorders: Secondary | ICD-10-CM | POA: Diagnosis not present

## 2016-02-19 DIAGNOSIS — R9431 Abnormal electrocardiogram [ECG] [EKG]: Secondary | ICD-10-CM | POA: Diagnosis present

## 2016-02-19 DIAGNOSIS — Z9114 Patient's other noncompliance with medication regimen: Secondary | ICD-10-CM

## 2016-02-19 DIAGNOSIS — E1043 Type 1 diabetes mellitus with diabetic autonomic (poly)neuropathy: Secondary | ICD-10-CM | POA: Diagnosis present

## 2016-02-19 DIAGNOSIS — E872 Acidosis, unspecified: Secondary | ICD-10-CM | POA: Diagnosis present

## 2016-02-19 DIAGNOSIS — G43A1 Cyclical vomiting, intractable: Secondary | ICD-10-CM

## 2016-02-19 DIAGNOSIS — Z833 Family history of diabetes mellitus: Secondary | ICD-10-CM

## 2016-02-19 DIAGNOSIS — F329 Major depressive disorder, single episode, unspecified: Secondary | ICD-10-CM | POA: Diagnosis present

## 2016-02-19 LAB — BASIC METABOLIC PANEL
ANION GAP: 14 (ref 5–15)
ANION GAP: 8 (ref 5–15)
BUN: 26 mg/dL — ABNORMAL HIGH (ref 6–20)
BUN: 23 mg/dL — ABNORMAL HIGH (ref 6–20)
CALCIUM: 8.9 mg/dL (ref 8.9–10.3)
CALCIUM: 8.4 mg/dL — AB (ref 8.9–10.3)
CO2: 10 mmol/L — ABNORMAL LOW (ref 22–32)
CO2: 13 mmol/L — ABNORMAL LOW (ref 22–32)
Chloride: 115 mmol/L — ABNORMAL HIGH (ref 101–111)
Chloride: 118 mmol/L — ABNORMAL HIGH (ref 101–111)
Creatinine, Ser: 1.56 mg/dL — ABNORMAL HIGH (ref 0.44–1.00)
Creatinine, Ser: 1.41 mg/dL — ABNORMAL HIGH (ref 0.44–1.00)
GFR, EST AFRICAN AMERICAN: 44 mL/min — AB (ref >60)
GFR, EST AFRICAN AMERICAN: 50 mL/min — AB (ref >60)
GFR, EST NON AFRICAN AMERICAN: 38 mL/min — AB (ref >60)
GFR, EST NON AFRICAN AMERICAN: 43 mL/min — AB (ref >60)
GLUCOSE: 235 mg/dL — AB (ref 65–99)
GLUCOSE: 108 mg/dL — AB (ref 65–99)
Potassium: 5 mmol/L (ref 3.5–5.1)
Potassium: 4.2 mmol/L (ref 3.5–5.1)
Sodium: 139 mmol/L (ref 135–145)
Sodium: 139 mmol/L (ref 135–145)

## 2016-02-19 LAB — CBG MONITORING, ED
Glucose-Capillary: 492 mg/dL — ABNORMAL HIGH (ref 65–99)
Glucose-Capillary: 476 mg/dL — ABNORMAL HIGH (ref 65–99)

## 2016-02-19 LAB — CBC WITH DIFFERENTIAL/PLATELET
BASOS PCT: 0 %
Basophils Absolute: 0 10*3/uL (ref 0.0–0.1)
EOS ABS: 0 10*3/uL (ref 0.0–0.7)
EOS PCT: 0 %
HCT: 30.7 % — ABNORMAL LOW (ref 36.0–46.0)
Hemoglobin: 9 g/dL — ABNORMAL LOW (ref 12.0–15.0)
LYMPHS ABS: 0.6 10*3/uL — AB (ref 0.7–4.0)
LYMPHS PCT: 5 %
MCH: 22.4 pg — AB (ref 26.0–34.0)
MCHC: 29.3 g/dL — AB (ref 30.0–36.0)
MCV: 76.4 fL — AB (ref 78.0–100.0)
MONO ABS: 0.2 10*3/uL (ref 0.1–1.0)
MONOS PCT: 2 %
NEUTROS ABS: 11.6 10*3/uL — AB (ref 1.7–7.7)
NEUTROS PCT: 93 %
PLATELETS: 505 10*3/uL — AB (ref 150–400)
RBC: 4.02 MIL/uL (ref 3.87–5.11)
RDW: 16.9 % — AB (ref 11.5–15.5)
WBC Morphology: INCREASED
WBC: 12.4 10*3/uL — AB (ref 4.0–10.5)

## 2016-02-19 LAB — COMPREHENSIVE METABOLIC PANEL
ALT: 18 U/L (ref 14–54)
AST: 24 U/L (ref 15–41)
Albumin: 4.3 g/dL (ref 3.5–5.0)
Alkaline Phosphatase: 93 U/L (ref 38–126)
BILIRUBIN TOTAL: 1.5 mg/dL — AB (ref 0.3–1.2)
BUN: 26 mg/dL — ABNORMAL HIGH (ref 6–20)
CHLORIDE: 108 mmol/L (ref 101–111)
CO2: 7 mmol/L — AB (ref 22–32)
Calcium: 9.3 mg/dL (ref 8.9–10.3)
Creatinine, Ser: 1.7 mg/dL — ABNORMAL HIGH (ref 0.44–1.00)
GFR, EST AFRICAN AMERICAN: 40 mL/min — AB (ref >60)
GFR, EST NON AFRICAN AMERICAN: 35 mL/min — AB (ref >60)
Glucose, Bld: 563 mg/dL (ref 65–99)
POTASSIUM: 5.7 mmol/L — AB (ref 3.5–5.1)
Sodium: 137 mmol/L (ref 135–145)
Total Protein: 8.2 g/dL — ABNORMAL HIGH (ref 6.5–8.1)

## 2016-02-19 LAB — URINALYSIS, ROUTINE W REFLEX MICROSCOPIC
Nitrite: NEGATIVE
PROTEIN: 100 mg/dL — AB
Specific Gravity, Urine: 1.024 (ref 1.005–1.030)
pH: 5 (ref 5.0–8.0)

## 2016-02-19 LAB — I-STAT CHEM 8, ED
BUN: 23 mg/dL — ABNORMAL HIGH (ref 6–20)
Calcium, Ion: 1.17 mmol/L (ref 1.13–1.30)
Chloride: 112 mmol/L — ABNORMAL HIGH (ref 101–111)
Creatinine, Ser: 1.2 mg/dL — ABNORMAL HIGH (ref 0.44–1.00)
Glucose, Bld: 570 mg/dL (ref 65–99)
HCT: 32 % — ABNORMAL LOW (ref 36.0–46.0)
HEMOGLOBIN: 10.9 g/dL — AB (ref 12.0–15.0)
POTASSIUM: 5.6 mmol/L — AB (ref 3.5–5.1)
SODIUM: 136 mmol/L (ref 135–145)
TCO2: 9 mmol/L (ref 0–100)

## 2016-02-19 LAB — URINE MICROSCOPIC-ADD ON

## 2016-02-19 LAB — GLUCOSE, CAPILLARY
GLUCOSE-CAPILLARY: 184 mg/dL — AB (ref 65–99)
GLUCOSE-CAPILLARY: 150 mg/dL — AB (ref 65–99)
GLUCOSE-CAPILLARY: 95 mg/dL (ref 65–99)
Glucose-Capillary: 427 mg/dL — ABNORMAL HIGH (ref 65–99)
Glucose-Capillary: 275 mg/dL — ABNORMAL HIGH (ref 65–99)
Glucose-Capillary: 212 mg/dL — ABNORMAL HIGH (ref 65–99)
Glucose-Capillary: 113 mg/dL — ABNORMAL HIGH (ref 65–99)
Glucose-Capillary: 103 mg/dL — ABNORMAL HIGH (ref 65–99)

## 2016-02-19 LAB — LACTIC ACID, PLASMA
LACTIC ACID, VENOUS: 2.6 mmol/L — AB (ref 0.5–1.9)
Lactic Acid, Venous: 1.2 mmol/L (ref 0.5–1.9)

## 2016-02-19 LAB — MAGNESIUM
MAGNESIUM: 2.3 mg/dL (ref 1.7–2.4)
Magnesium: 2.2 mg/dL (ref 1.7–2.4)

## 2016-02-19 LAB — PHOSPHORUS: Phosphorus: 5.5 mg/dL — ABNORMAL HIGH (ref 2.5–4.6)

## 2016-02-19 LAB — I-STAT CG4 LACTIC ACID, ED: LACTIC ACID, VENOUS: 4.22 mmol/L — AB (ref 0.5–1.9)

## 2016-02-19 MED ORDER — SODIUM CHLORIDE 0.9 % IV SOLN
INTRAVENOUS | Status: DC
  Administered 2016-02-19: 15:00:00 via INTRAVENOUS

## 2016-02-19 MED ORDER — DEXTROSE-NACL 5-0.45 % IV SOLN: INTRAVENOUS | Status: DC

## 2016-02-19 MED ORDER — ENOXAPARIN SODIUM 40 MG/0.4ML ~~LOC~~ SOLN
40.0000 mg | SUBCUTANEOUS | Status: DC
  Administered 2016-02-19 – 2016-02-20 (×2): 40 mg via SUBCUTANEOUS
  Filled 2016-02-19 (×3): qty 0.4

## 2016-02-19 MED ORDER — PROCHLORPERAZINE EDISYLATE 5 MG/ML IJ SOLN: 5.0000 mg | INTRAMUSCULAR | Status: DC | PRN

## 2016-02-19 MED ORDER — DIPHENHYDRAMINE HCL 50 MG/ML IJ SOLN
25.0000 mg | Freq: Once | INTRAMUSCULAR | Status: AC
  Administered 2016-02-19: 25 mg via INTRAVENOUS
  Filled 2016-02-19: qty 1

## 2016-02-19 MED ORDER — SODIUM CHLORIDE 0.9 % IV BOLUS (SEPSIS)
1000.0000 mL | Freq: Once | INTRAVENOUS | Status: AC
  Administered 2016-02-19: 1000 mL via INTRAVENOUS

## 2016-02-19 MED ORDER — ALBUTEROL SULFATE (2.5 MG/3ML) 0.083% IN NEBU: 2.5000 mg | INHALATION_SOLUTION | Freq: Four times a day (QID) | RESPIRATORY_TRACT | Status: DC | PRN

## 2016-02-19 MED ORDER — LORAZEPAM 2 MG/ML IJ SOLN
1.0000 mg | INTRAMUSCULAR | Status: DC | PRN
  Administered 2016-02-19: 1 mg via INTRAVENOUS
  Filled 2016-02-19: qty 1

## 2016-02-19 MED ORDER — BUSPIRONE HCL 5 MG PO TABS
5.0000 mg | ORAL_TABLET | Freq: Three times a day (TID) | ORAL | Status: DC
  Administered 2016-02-19 – 2016-02-21 (×5): 5 mg via ORAL
  Filled 2016-02-19 (×7): qty 1

## 2016-02-19 MED ORDER — ONDANSETRON HCL 4 MG/2ML IJ SOLN
4.0000 mg | Freq: Once | INTRAMUSCULAR | Status: AC
  Administered 2016-02-19: 4 mg via INTRAVENOUS
  Filled 2016-02-19: qty 2

## 2016-02-19 MED ORDER — LORAZEPAM 2 MG/ML IJ SOLN
0.5000 mg | INTRAMUSCULAR | Status: DC | PRN
  Administered 2016-02-19 – 2016-02-20 (×3): 0.5 mg via INTRAVENOUS
  Filled 2016-02-19 (×4): qty 1

## 2016-02-19 MED ORDER — ALBUTEROL SULFATE HFA 108 (90 BASE) MCG/ACT IN AERS: 2.0000 | INHALATION_SPRAY | Freq: Four times a day (QID) | RESPIRATORY_TRACT | Status: DC | PRN

## 2016-02-19 MED ORDER — PREGABALIN 75 MG PO CAPS
150.0000 mg | ORAL_CAPSULE | Freq: Two times a day (BID) | ORAL | Status: DC
  Administered 2016-02-19 – 2016-02-21 (×4): 150 mg via ORAL
  Filled 2016-02-19 (×4): qty 2

## 2016-02-19 MED ORDER — SODIUM CHLORIDE 0.9 % IV SOLN
INTRAVENOUS | Status: DC
  Administered 2016-02-19: 4.3 [IU]/h via INTRAVENOUS
  Filled 2016-02-19: qty 2.5

## 2016-02-19 MED ORDER — DEXTROSE-NACL 5-0.45 % IV SOLN
INTRAVENOUS | Status: DC
  Administered 2016-02-19 – 2016-02-21 (×3): via INTRAVENOUS

## 2016-02-19 NOTE — Progress Notes (Signed)
Pt on menstrual cycle therefore Urinalysis results show high number of RBCs, [place] Robben P Bless [place]

## 2016-02-19 NOTE — ED Triage Notes (Signed)
Patient here from home. CBG 554. Hyperglycemia. N/V, abdominal pain. Did not take insulin. Tachy, diaphoretic.

## 2016-02-19 NOTE — H&P (Signed)
History and Physical    [name] [id number] DOB: 11/09/1965 DOA: 02/19/2016  PCP: [name], MD  Patient coming from: home  Chief Complaint: Abdominal pain, nausea or vomiting  HPI: [name] is a 50 y.o. female with medical history significant of type 1 diabetes mellitus with multiple hospitalizations for DKA in the setting of noncompliance, gastroparesis, hypertension, hyperlipidemia, depression, presents to the emergency room with chief complaint of abdominal pain, nausea and vomiting. She was seen yesterday in the emergency room with similar complaints, and blood work at that time revealed elevated CBG however no and gap, she was treated symptomatically and sent home. She states that at home she continued to feel bad, and did not take her insulin because of that, and this morning she continued to have epigastric abdominal pain, nausea, vomiting and decided to, again to the emergency room. She has no chest pain or shortness of breath. She has no lightheadedness or dizziness. She complains of severe anxiety on my interview in the emergency room. She complains of few diarrheal episodes in the last couple of days. She has no cough or chest congestion. She denies any dysuria.   ED Course: In the emergency room, patient's vital signs were stable, she is a bit tachypnea can tachycardic, initial blood work revealed a creatinine of 1.7, CBG is 563 with an gap of about 22, potassium is mildly elevated at 5.7, that she has a white count of 12.4. Her lactic acid is 4.2. Urinalysis is clear. TRH was asked for admission for DKA.   Review of Systems: As per HPI otherwise 10 point review of systems negative.   Past Medical History:  Diagnosis Date  . Depression   . Diabetes mellitus without complication ([place])   . Essential hypertension   . Gastroparesis   . GERD (gastroesophageal reflux disease)   . HLD (hyperlipidemia)     Past Surgical History:  Procedure Laterality Date  .  COLONOSCOPY  09/27/2014   at [facility]  . ESOPHAGOGASTRODUODENOSCOPY  09/27/2014   at [facility], Dr Rolan Lipa. biospy neg for celiac, neg for H pylori.   . EYE SURGERY    . gailstones    . IR GENERIC HISTORICAL  01/24/2016   IR FLUORO GUIDE CV LINE RIGHT 01/24/2016 Darrell K Allred, PA-C [facility]  . IR GENERIC HISTORICAL  01/24/2016   IR US GUIDE VASC ACCESS RIGHT 01/24/2016 Darrell K Allred, PA-C [facility]  . POSTERIOR VITRECTOMY AND MEMBRANE PEEL-LEFT EYE  09/28/2002  . POSTERIOR VITRECTOMY AND MEMBRANE PEEL-RIGHT EYE  03/16/2002  . RETINAL DETACHMENT SURGERY       reports that she has never smoked. She has never used smokeless tobacco. She reports that she does not drink alcohol or use drugs.  Allergies  Allergen Reactions  . Anesthetics, Amide Nausea And Vomiting  . Penicillins Diarrhea and Nausea And Vomiting    Has patient had a PCN reaction causing immediate rash, facial/tongue/throat swelling, SOB or lightheadedness with hypotension: No Has patient had a PCN reaction causing severe rash involving mucus membranes or skin necrosis: No Has patient had a PCN reaction that required hospitalization No Has patient had a PCN reaction occurring within the last 10 years: Yes  If all of the above answers are "NO", then may proceed with Cephalosporin use.   . Buprenorphine Hcl Rash  . Encainide Nausea And Vomiting    Family History  Problem Relation Age of Onset  . Cystic fibrosis Mother   . Hypertension Father   . Diabetes  Brother   . Hypertension Maternal Grandmother     Prior to Admission medications   Medication Sig Start Date End Date Taking? Authorizing Provider  albuterol (PROVENTIL HFA;VENTOLIN HFA) 108 (90 Base) MCG/ACT inhaler Inhale 2 puffs into the lungs every 6 (six) hours as needed for wheezing or shortness of breath. 10/20/15   [name], MD  Blood Glucose Monitoring Suppl (TRUE METRIX METER) DEVI 1 each by Does not apply route 3 (three) times daily before meals.  07/19/15   [name], MD  busPIRone (BUSPAR) 5 MG tablet Take 1 tablet (5 mg total) by mouth 3 (three) times daily. 01/31/16   [name], MD  furosemide (LASIX) 40 MG tablet Take 40 mg by mouth.    Historical Provider, MD  glucose blood (TRUE METRIX BLOOD GLUCOSE TEST) test strip Use 3 times daily before meals 01/31/16   [name], MD  insulin aspart (NOVOLOG) 100 UNIT/ML injection Inject 0-15 Units into the skin 3 (three) times daily with meals. Per sliding scale 01/31/16   [name], MD  insulin detemir (LEVEMIR) 100 UNIT/ML injection Inject 0.25 mLs (25 Units total) into the skin at bedtime. 01/31/16   [name], MD  Insulin Syringe-Needle U-100 (BD INSULIN SYRINGE ULTRAFINE) 31G X 15/64" 0.5 ML MISC 1 each by Does not apply route 4 (four) times daily as needed. 08/16/15   [name], MD  lisinopril (PRINIVIL,ZESTRIL) 20 MG tablet Take 10 mg by mouth daily.     Historical Provider, MD  metoCLOPramide (REGLAN) 5 MG/5ML solution Take 5 mLs (5 mg total) by mouth 4 (four) times daily -  before meals and at bedtime. 01/31/16   [name], MD  ondansetron (ZOFRAN ODT) 4 MG disintegrating tablet Take 1 tablet (4 mg total) by mouth every 8 (eight) hours as needed for nausea. 01/31/16   [name], MD  polyethylene glycol powder (GLYCOLAX/MIRALAX) powder Take 1 Container by mouth daily. Reported on 07/19/2015 04/06/15   Historical Provider, MD  pregabalin (LYRICA) 150 MG capsule Take 1 capsule (150 mg total) by mouth 2 (two) times daily. 01/31/16   [name], MD    Physical Exam: Vitals:   02/19/16 1225  BP: 124/74  Pulse: (!) 125  Resp: (!) 30  SpO2: 100%      Constitutional: anxious, tremulous rocking back and forth Vitals:   02/19/16 1225  BP: 124/74  Pulse: (!) 125  Resp: (!) 30  SpO2: 100%   Eyes: PERRL ENMT: Mucous membranes are dry  Neck: normal, supple Respiratory: clear to auscultation bilaterally, no wheezing, no crackles.  Cardiovascular: Regular rate and rhythm, no  murmurs / rubs / gallops. No extremity edema. 2+ pedal pulses.  Abdomen: +epigastric tenderness, no masses palpated. Bowel sounds positive.  no guarding or rebound  Musculoskeletal: no clubbing / cyanosis. Normal muscle tone.  Skin: no rashes, lesions, ulcers. No induration Neurologic: CN 2-12 grossly intact. Strength 5/5 in all 4.  Psychiatric: Alert and oriented x 3. Anxious   Labs on Admission: I have personally reviewed following labs and imaging studies  CBC:  Recent Labs Lab 02/18/16 0132 02/19/16 1205 02/19/16 1214  WBC 7.3 12.4*  --   NEUTROABS  --  11.6*  --   HGB 10.3* 9.0* 10.9*  HCT 33.3* 30.7* 32.0*  MCV 72.1* 76.4*  --   PLT 410* 505*  --    Basic Metabolic Panel:  Recent Labs Lab 02/18/16 0132 02/19/16 1205 02/19/16 1214  NA 134* 137 136  K 4.6 5.7* 5.6*  CL 102 108 112*  CO2 19* 7*  --   GLUCOSE 389* 563* 570*  BUN 17 26* 23*  CREATININE 1.12* 1.70* 1.20*  CALCIUM 9.6 9.3  --   MG  --  2.3  --    GFR: Estimated Creatinine Clearance: 50.7 mL/min (by C-G formula based on SCr of 1.2 mg/dL). Liver Function Tests:  Recent Labs Lab 02/18/16 0132 02/19/16 1205  AST 23 24  ALT 19 18  ALKPHOS 93 93  BILITOT 0.8 1.5*  PROT 8.0 8.2*  ALBUMIN 4.3 4.3    Recent Labs Lab 02/18/16 0132  LIPASE 28   No results for input(s): AMMONIA in the last 168 hours. Coagulation Profile: No results for input(s): INR, PROTIME in the last 168 hours. Cardiac Enzymes: No results for input(s): CKTOTAL, CKMB, CKMBINDEX, TROPONINI in the last 168 hours. BNP (last 3 results) No results for input(s): PROBNP in the last 8760 hours. HbA1C: No results for input(s): HGBA1C in the last 72 hours. CBG:  Recent Labs Lab 02/18/16 0552 02/18/16 0829 02/18/16 0949 02/19/16 1148 02/19/16 1413  GLUCAP 473* 338* 284* 492* 476*   Lipid Profile: No results for input(s): CHOL, HDL, LDLCALC, TRIG, CHOLHDL, LDLDIRECT in the last 72 hours. Thyroid Function Tests: No results  for input(s): TSH, T4TOTAL, FREET4, T3FREE, THYROIDAB in the last 72 hours. Anemia Panel: No results for input(s): VITAMINB12, FOLATE, FERRITIN, TIBC, IRON, RETICCTPCT in the last 72 hours. Urine analysis:    Component Value Date/Time   COLORURINE YELLOW 02/18/2016 0709   APPEARANCEUR CLEAR 02/18/2016 0709   LABSPEC 1.026 02/18/2016 0709   PHURINE 5.5 02/18/2016 0709   GLUCOSEU >1000 (A) 02/18/2016 0709   HGBUR NEGATIVE 02/18/2016 0709   BILIRUBINUR NEGATIVE 02/18/2016 0709   BILIRUBINUR NEGATIVE 08/04/2012 1005   KETONESUR >80 (A) 02/18/2016 0709   PROTEINUR NEGATIVE 02/18/2016 0709   UROBILINOGEN 0.2 04/27/2015 1608   NITRITE NEGATIVE 02/18/2016 0709   LEUKOCYTESUR NEGATIVE 02/18/2016 0709   Sepsis Labs: @LABRCNTIP (procalcitonin:4,lacticidven:4) )No results found for this or any previous visit (from the past 240 hour(s)).   Radiological Exams on Admission: No results found.  EKG: Independently reviewed. Sinus rhythm. QTC 460 ,   Assessment/Plan Active Problems:   Diabetic ketoacidosis without coma associated with type 1 diabetes mellitus (HCC)   Nausea & vomiting   Diabetic neuropathy, type I diabetes mellitus (HCC)   Anxiety and depression   Diabetic gastroparesis (HCC)   Lactic acidosis   AKI (acute kidney injury) ([place])   Prolonged QT interval   Essential hypertension   DKA (diabetic ketoacidoses) ([place])   DKA  - Admit patient to step down, this is likely in the setting of noncompliance, provide IV insulin drip, already received bolus in the ED, continue normal saline per protocol. Check BMP every 4 hours. Resume home medications when an anion gap is closed. Keep nothing by mouth meanwhile.   Diabetic gastroparesis  - supportive management as per #1  Diabetes mellitus, uncontrolled, with neuropathy  - Management as above, resume home Lyrica   Hypertension - Blood pressure normal in the emergency room, hold lisinopril in the setting of acute kidney  injury  Acute kidney injury - Creatinine 1.7 initially, repeat 1.2, provide IV fluids, repeat BMP in the morning  Depression/anxiety - Continue BuSpar   DVT prophylaxis: Lovenox  Code Status: Full  Family Communication: no family bedside Disposition Plan: admit to SDU Consults called: none  Admission status: inpatient    Marzetta Board, MD [facility] Pager [phone]  If 7PM-7AM, please contact night-coverage [email or web address] Password [email or web address]  02/19/2016, 2:19 PM

## 2016-02-19 NOTE — ED Notes (Signed)
Bed: WA23 Expected date:  Expected time:  Means of arrival:  Comments: EMS hyperglycemia 

## 2016-02-19 NOTE — ED Provider Notes (Signed)
[place] DEPT Provider Note   CSN: [id number] Arrival date & time: 02/19/16  1135     History   Chief Complaint Chief Complaint  Patient presents with  . Hyperglycemia    HPI [name] is a 50 y.o. female.  Patient presents for evaluation of vomiting and an anxiety attack. Has a history of insulin dependent diabetes and questionable compliance. Was here yesterday with vomiting. Had hyperglycemia but not an acidosis. Was discharged home taking by mouth. She states she waking this morning vomiting again. Has not taken insulin since she left the hospital. States she's having a "panic attack". Presents with tachypnea anxiety vomiting and retching and abdominal pain.  HPI  Past Medical History:  Diagnosis Date  . Depression   . Diabetes mellitus without complication ([place])   . Essential hypertension   . Gastroparesis   . GERD (gastroesophageal reflux disease)   . HLD (hyperlipidemia)     Patient Active Problem List   Diagnosis Date Noted  . DKA (diabetic ketoacidoses) ([place]) 02/19/2016  . Essential hypertension 01/24/2016  . Leukocytosis 01/24/2016  . Prolonged QT interval 11/27/2015  . Abnormal ECG 11/27/2015  . HLD (hyperlipidemia)   . GERD (gastroesophageal reflux disease)   . Metabolic acidosis, normal anion gap (NAG)   . AKI (acute kidney injury) ([place])   . Anemia, iron deficiency   . DKA, type 1, not at goal [facility])   . Hyponatremia 09/02/2015  . Vitamin B12 deficiency 08/16/2015  . Lactic acidosis 07/29/2015  . Acute pyelonephritis 07/29/2015  . Pyelonephritis   . Hypothermia   . Diabetic gastroparesis ([place])   . Diabetic neuropathy, type I diabetes mellitus ([place]) 05/18/2015  . Anemia 05/18/2015  . Anxiety and depression 05/18/2015  . Nausea & vomiting   . Diabetic ketoacidosis without coma associated with type 1 diabetes mellitus ([place])   . High anion gap metabolic acidosis [id number]    Past Surgical History:  Procedure Laterality Date  . COLONOSCOPY   09/27/2014   at [facility]  . ESOPHAGOGASTRODUODENOSCOPY  09/27/2014   at [facility], Dr Rolan Lipa. biospy neg for celiac, neg for H pylori.   . EYE SURGERY    . gailstones    . IR GENERIC HISTORICAL  01/24/2016   IR FLUORO GUIDE CV LINE RIGHT 01/24/2016 Darrell K Allred, PA-C [facility]-INTER[facility] RAD  . IR GENERIC HISTORICAL  01/24/2016   IR US GUIDE VASC ACCESS RIGHT 01/24/2016 Darrell K Allred, PA-C [facility]  . POSTERIOR VITRECTOMY AND MEMBRANE PEEL-LEFT EYE  09/28/2002  . POSTERIOR VITRECTOMY AND MEMBRANE PEEL-RIGHT EYE  03/16/2002  . RETINAL DETACHMENT SURGERY      OB History    No data available       Home Medications    Prior to Admission medications   Medication Sig Start Date End Date Taking? Authorizing Provider  albuterol (PROVENTIL HFA;VENTOLIN HFA) 108 (90 Base) MCG/ACT inhaler Inhale 2 puffs into the lungs every 6 (six) hours as needed for wheezing or shortness of breath. 10/20/15   [name], MD  Blood Glucose Monitoring Suppl (TRUE METRIX METER) DEVI 1 each by Does not apply route 3 (three) times daily before meals. 07/19/15   [name], MD  busPIRone (BUSPAR) 5 MG tablet Take 1 tablet (5 mg total) by mouth 3 (three) times daily. 01/31/16   [name], MD  furosemide (LASIX) 40 MG tablet Take 40 mg by mouth.    Historical Provider, MD  glucose blood (TRUE METRIX BLOOD GLUCOSE TEST) test strip Use 3 times daily  before meals 01/31/16   [name], MD  insulin aspart (NOVOLOG) 100 UNIT/ML injection Inject 0-15 Units into the skin 3 (three) times daily with meals. Per sliding scale 01/31/16   [name], MD  insulin detemir (LEVEMIR) 100 UNIT/ML injection Inject 0.25 mLs (25 Units total) into the skin at bedtime. 01/31/16   [name], MD  Insulin Syringe-Needle U-100 (BD INSULIN SYRINGE ULTRAFINE) 31G X 15/64" 0.5 ML MISC 1 each by Does not apply route 4 (four) times daily as needed. 08/16/15   [name], MD  lisinopril (PRINIVIL,ZESTRIL) 20 MG tablet Take 10 mg by mouth daily.      Historical Provider, MD  metoCLOPramide (REGLAN) 5 MG/5ML solution Take 5 mLs (5 mg total) by mouth 4 (four) times daily -  before meals and at bedtime. 01/31/16   [name], MD  ondansetron (ZOFRAN ODT) 4 MG disintegrating tablet Take 1 tablet (4 mg total) by mouth every 8 (eight) hours as needed for nausea. 01/31/16   [name], MD  polyethylene glycol powder (GLYCOLAX/MIRALAX) powder Take 1 Container by mouth daily. Reported on 07/19/2015 04/06/15   Historical Provider, MD  pregabalin (LYRICA) 150 MG capsule Take 1 capsule (150 mg total) by mouth 2 (two) times daily. 01/31/16   [name], MD    Family History Family History  Problem Relation Age of Onset  . Cystic fibrosis Mother   . Hypertension Father   . Diabetes Brother   . Hypertension Maternal Grandmother     Social History Social History  Substance Use Topics  . Smoking status: Never Smoker  . Smokeless tobacco: Never Used  . Alcohol use No     Allergies   Anesthetics, amide; Penicillins; Buprenorphine hcl; and Encainide   Review of Systems Review of Systems  Constitutional: Negative for appetite change, chills, diaphoresis, fatigue and fever.  HENT: Negative for mouth sores, sore throat and trouble swallowing.   Eyes: Negative for visual disturbance.  Respiratory: Negative for cough, chest tightness, shortness of breath and wheezing.   Cardiovascular: Negative for chest pain.  Gastrointestinal: Positive for abdominal distention, abdominal pain, nausea and vomiting. Negative for diarrhea.  Endocrine: Negative for polydipsia, polyphagia and polyuria.  Genitourinary: Negative for dysuria, frequency and hematuria.  Musculoskeletal: Negative for gait problem.  Skin: Negative for color change, pallor and rash.  Neurological: Negative for dizziness, syncope, light-headedness and headaches.  Hematological: Does not bruise/bleed easily.  Psychiatric/Behavioral: Negative for behavioral problems and confusion.      Physical Exam Updated Vital Signs BP 124/74 (BP Location: Left Arm)   Pulse (!) 125   Resp (!) 30   LMP 01/15/2016 (Exact Date)   SpO2 100%   Physical Exam  Constitutional: She is oriented to person, place, and time. She appears well-developed and well-nourished. No distress.  HENT:  Head: Normocephalic.  Eyes: Conjunctivae are normal. Pupils are equal, round, and reactive to light. No scleral icterus.  Neck: Normal range of motion. Neck supple. No thyromegaly present.  Cardiovascular: Regular rhythm.  Tachycardia present.  Exam reveals no gallop and no friction rub.   No murmur heard. Pulmonary/Chest: Effort normal and breath sounds normal. Tachypnea noted. No respiratory distress. She has no wheezes. She has no rales.  Abdominal: Soft. Bowel sounds are normal. She exhibits no distension. There is tenderness. There is no rebound and no guarding.  Musculoskeletal: Normal range of motion.  Neurological: She is alert and oriented to person, place, and time.  Skin: Skin is warm and dry. No rash noted.  Psychiatric: Her behavior is normal. Her mood appears anxious. Her speech is rapid and/or pressured.     ED Treatments / Results  Labs (all labs ordered are listed, but only abnormal results are displayed) Labs Reviewed  CBC WITH DIFFERENTIAL/PLATELET - Abnormal; Notable for the following:       Result Value   WBC 12.4 (*)    Hemoglobin 9.0 (*)    HCT 30.7 (*)    MCV 76.4 (*)    MCH 22.4 (*)    MCHC 29.3 (*)    RDW 16.9 (*)    Platelets 505 (*)    Neutro Abs 11.6 (*)    Lymphs Abs 0.6 (*)    All other components within normal limits  COMPREHENSIVE METABOLIC PANEL - Abnormal; Notable for the following:    Potassium 5.7 (*)    CO2 7 (*)    Glucose, Bld 563 (*)    BUN 26 (*)    Creatinine, Ser 1.70 (*)    Total Protein 8.2 (*)    Total Bilirubin 1.5 (*)    GFR calc non Af Amer 35 (*)    GFR calc Af Amer 40 (*)    All other components within normal limits   PHOSPHORUS - Abnormal; Notable for the following:    Phosphorus 5.5 (*)    All other components within normal limits  CBG MONITORING, ED - Abnormal; Notable for the following:    Glucose-Capillary 492 (*)    All other components within normal limits  I-STAT CG4 LACTIC ACID, ED - Abnormal; Notable for the following:    Lactic Acid, Venous 4.22 (*)    All other components within normal limits  I-STAT CHEM 8, ED - Abnormal; Notable for the following:    Potassium 5.6 (*)    Chloride 112 (*)    BUN 23 (*)    Creatinine, Ser 1.20 (*)    Glucose, Bld 570 (*)    Hemoglobin 10.9 (*)    HCT 32.0 (*)    All other components within normal limits  CBG MONITORING, ED - Abnormal; Notable for the following:    Glucose-Capillary 476 (*)    All other components within normal limits  MAGNESIUM  MAGNESIUM  URINALYSIS, ROUTINE W REFLEX MICROSCOPIC (NOT AT [facility])  BASIC METABOLIC PANEL  BASIC METABOLIC PANEL  BASIC METABOLIC PANEL  LACTIC ACID, PLASMA    EKG  EKG Interpretation None       Radiology No results found.  Procedures Procedures (including critical care time)  Medications Ordered in ED Medications  insulin regular (NOVOLIN R,HUMULIN R) 250 Units in sodium chloride 0.9 % 250 mL (1 Units/mL) infusion (4.3 Units/hr Intravenous New Bag/Given 02/19/16 1312)  0.9 %  sodium chloride infusion ( Intravenous New Bag/Given 02/19/16 1447)  dextrose 5 %-0.45 % sodium chloride infusion (not administered)  enoxaparin (LOVENOX) injection 40 mg (not administered)  LORazepam (ATIVAN) injection 0.5 mg (not administered)  prochlorperazine (COMPAZINE) injection 5 mg (not administered)  ondansetron (ZOFRAN) injection 4 mg (4 mg Intravenous Given 02/19/16 1216)  diphenhydrAMINE (BENADRYL) injection 25 mg (25 mg Intravenous Given 02/19/16 1216)  sodium chloride 0.9 % bolus 1,000 mL (1,000 mLs Intravenous New Bag/Given 02/19/16 1215)     Initial Impression / Assessment and Plan / ED Course  I have  reviewed the triage vital signs and the nursing notes.  Pertinent labs & imaging results that were available during my care of the patient were reviewed by me and considered in my medical decision making (  see chart for details).  Clinical Course    IV access obtained. Given IV fluids. Given 1 L bolus initiated on second 250 per hour. Started on glucose stabilizer. His anion gap of 25 and bicarbonate of 7. Continues to mentate well. Given 1 mg of Ativan and is less anxious. Is producing urine. Hemolytically stable. Discussed with hospitalist. Will be admitted with stepdown bed.criti   CRITICAL CARE Performed by: Tanna Furry JOSEPH   Total critical care time: 45 minutes  Critical care time was exclusive of separately billable procedures and treating other patients.  Critical care was necessary to treat or prevent imminent or life-threatening deterioration.  Critical care was time spent personally by me on the following activities: development of treatment plan with patient and/or surrogate as well as nursing, discussions with consultants, evaluation of patient's response to treatment, examination of patient, obtaining history from patient or surrogate, ordering and performing treatments and interventions, ordering and review of laboratory studies, ordering and review of radiographic studies, pulse oximetry and re-evaluation of patient's condition.   Final Clinical Impressions(s) / ED Diagnoses   Final diagnoses:  Diabetic ketoacidosis without coma associated with type 1 diabetes mellitus ([place])    New Prescriptions New Prescriptions   No medications on file     Tanna Furry, MD 02/19/16 1523

## 2016-02-20 DIAGNOSIS — E1010 Type 1 diabetes mellitus with ketoacidosis without coma: Principal | ICD-10-CM

## 2016-02-20 DIAGNOSIS — E1040 Type 1 diabetes mellitus with diabetic neuropathy, unspecified: Secondary | ICD-10-CM | POA: Diagnosis not present

## 2016-02-20 DIAGNOSIS — E1143 Type 2 diabetes mellitus with diabetic autonomic (poly)neuropathy: Secondary | ICD-10-CM | POA: Diagnosis not present

## 2016-02-20 DIAGNOSIS — N179 Acute kidney failure, unspecified: Secondary | ICD-10-CM

## 2016-02-20 DIAGNOSIS — F418 Other specified anxiety disorders: Secondary | ICD-10-CM | POA: Diagnosis not present

## 2016-02-20 DIAGNOSIS — K3184 Gastroparesis: Secondary | ICD-10-CM

## 2016-02-20 LAB — BASIC METABOLIC PANEL
ANION GAP: 7 (ref 5–15)
Anion gap: 5 (ref 5–15)
BUN: 21 mg/dL — ABNORMAL HIGH (ref 6–20)
BUN: 20 mg/dL (ref 6–20)
CALCIUM: 8.3 mg/dL — AB (ref 8.9–10.3)
CALCIUM: 8.2 mg/dL — AB (ref 8.9–10.3)
CHLORIDE: 116 mmol/L — AB (ref 101–111)
CO2: 16 mmol/L — AB (ref 22–32)
CO2: 17 mmol/L — ABNORMAL LOW (ref 22–32)
CREATININE: 1.36 mg/dL — AB (ref 0.44–1.00)
CREATININE: 1.19 mg/dL — AB (ref 0.44–1.00)
Chloride: 116 mmol/L — ABNORMAL HIGH (ref 101–111)
GFR calc non Af Amer: 53 mL/min — ABNORMAL LOW (ref >60)
GFR, EST AFRICAN AMERICAN: 52 mL/min — AB (ref >60)
GFR, EST NON AFRICAN AMERICAN: 45 mL/min — AB (ref >60)
Glucose, Bld: 196 mg/dL — ABNORMAL HIGH (ref 65–99)
Glucose, Bld: 118 mg/dL — ABNORMAL HIGH (ref 65–99)
Potassium: 4.4 mmol/L (ref 3.5–5.1)
Potassium: 3.8 mmol/L (ref 3.5–5.1)
SODIUM: 139 mmol/L (ref 135–145)
SODIUM: 138 mmol/L (ref 135–145)

## 2016-02-20 LAB — GLUCOSE, CAPILLARY
GLUCOSE-CAPILLARY: 105 mg/dL — AB (ref 65–99)
GLUCOSE-CAPILLARY: 115 mg/dL — AB (ref 65–99)
GLUCOSE-CAPILLARY: 128 mg/dL — AB (ref 65–99)
GLUCOSE-CAPILLARY: 131 mg/dL — AB (ref 65–99)
GLUCOSE-CAPILLARY: 134 mg/dL — AB (ref 65–99)
GLUCOSE-CAPILLARY: 149 mg/dL — AB (ref 65–99)
GLUCOSE-CAPILLARY: 184 mg/dL — AB (ref 65–99)
GLUCOSE-CAPILLARY: 187 mg/dL — AB (ref 65–99)
GLUCOSE-CAPILLARY: 208 mg/dL — AB (ref 65–99)
GLUCOSE-CAPILLARY: 212 mg/dL — AB (ref 65–99)
Glucose-Capillary: 114 mg/dL — ABNORMAL HIGH (ref 65–99)
Glucose-Capillary: 105 mg/dL — ABNORMAL HIGH (ref 65–99)
Glucose-Capillary: 111 mg/dL — ABNORMAL HIGH (ref 65–99)

## 2016-02-20 LAB — CBC
HCT: 28.8 % — ABNORMAL LOW (ref 36.0–46.0)
Hemoglobin: 8.7 g/dL — ABNORMAL LOW (ref 12.0–15.0)
MCH: 22.5 pg — ABNORMAL LOW (ref 26.0–34.0)
MCHC: 30.2 g/dL (ref 30.0–36.0)
MCV: 74.6 fL — ABNORMAL LOW (ref 78.0–100.0)
PLATELETS: 384 10*3/uL (ref 150–400)
RBC: 3.86 MIL/uL — AB (ref 3.87–5.11)
RDW: 16.9 % — AB (ref 11.5–15.5)
WBC: 10.8 10*3/uL — AB (ref 4.0–10.5)

## 2016-02-20 MED ORDER — INSULIN DETEMIR 100 UNIT/ML ~~LOC~~ SOLN
24.0000 [IU] | Freq: Every day | SUBCUTANEOUS | Status: DC
  Administered 2016-02-20 – 2016-02-21 (×2): 24 [IU] via SUBCUTANEOUS
  Filled 2016-02-20 (×2): qty 0.24

## 2016-02-20 MED ORDER — INSULIN ASPART 100 UNIT/ML ~~LOC~~ SOLN: 0.0000 [IU] | SUBCUTANEOUS | Status: DC

## 2016-02-20 MED ORDER — INSULIN ASPART 100 UNIT/ML ~~LOC~~ SOLN
3.0000 [IU] | Freq: Three times a day (TID) | SUBCUTANEOUS | Status: DC
  Administered 2016-02-20: 3 [IU] via SUBCUTANEOUS

## 2016-02-20 MED ORDER — INSULIN ASPART 100 UNIT/ML ~~LOC~~ SOLN
0.0000 [IU] | Freq: Three times a day (TID) | SUBCUTANEOUS | Status: DC
  Administered 2016-02-20: 3 [IU] via SUBCUTANEOUS
  Administered 2016-02-21 (×2): 7 [IU] via SUBCUTANEOUS

## 2016-02-20 MED ORDER — INSULIN ASPART 100 UNIT/ML ~~LOC~~ SOLN: 0.0000 [IU] | Freq: Every day | SUBCUTANEOUS | Status: DC

## 2016-02-20 NOTE — Progress Notes (Signed)
Inpatient Diabetes Program Recommendations  AACE/ADA: New Consensus Statement on Inpatient Glycemic Control (2015)  Target Ranges:  Prepandial:   less than 140 mg/dL      Peak postprandial:   less than 180 mg/dL (1-2 hours)      Critically ill patients:  140 - 180 mg/dL   Lab Results  Component Value Date   GLUCAP 131 (H) 02/20/2016   HGBA1C 9.4 (H) 01/19/2016    Review of Glycemic Control  Diabetes history: DM1 Outpatient Diabetes medications: Levemir 25 units QHS, Novolog 0-15 units tidwc Current orders for Inpatient glycemic control: Levemir 24 units QD, Novolog 0-20 units Q4H. K+ - 3.8  Transitioning off insulin drip at present. CO2 - 17. Gap - 5 HgbA1C improved to 9.4% from 11.1% in 11/28/2015.    Inpatient Diabetes Program Recommendations:    When po intake increases, add Novolog 3 units tidwc for meal coverage insulin. Give meal coverage insulin AFTER meal if pt eats > 50% meal, d/t gastroparesis. Has been very sensitive to insulin in previous hospital stays - would decrease Novolog to sensitive tidwc and hs.  Will speak with pt regarding her glycemic control at home. Continue to follow.  Thank you. Lorenda Peck, RD, LDN, CDE Inpatient Diabetes Coordinator [phone]

## 2016-02-20 NOTE — Progress Notes (Signed)
PROGRESS NOTE                                                                                                                                                                                                             Patient Demographics:    [name], is a 50 y.o. female, DOB - December 17, 1965, [id number]  Admit date - 02/19/2016   Admitting Physician Costin Karlyne Greenspan, MD  Outpatient Primary MD for the patient is [name], MD  LOS - 1  Outpatient Specialists: none  Chief Complaint  Patient presents with  . Hyperglycemia       Brief Narrative   50 year old female with history of type 1 diabetes mellitus with peripheral hospitalization for DKA secondary to noncompliance, diabetic gastroparesis, hypertension, hyperlipidemia and depression presented with abdominal pain, nausea and vomiting for past 2 days. Patient found to be in DKA and admitted to stepdown on glucose stabilizer.   Subjective:    Patient reports feeling very tired. No nausea, vomiting or abdominal pain. Reports that she fell sick 3 days ago and since he had poor appetite and missed taking her insulin for a day   Assessment  & Plan :    Active Problems:   Diabetic ketoacidosis without coma associated with type 1 diabetes mellitus ([place]) Monitored on stepdown with aggressive hydration and insulin drip. Anion gap has now closed. Transition to Levemir 24 units with sliding scale coverage. Add pre-meal aspart if she has adequate po intake. Diabetic coordinator following. Counseled on diet and medication adherence. She will benefit from being referred to endocrinology.    Nausea & vomiting Secondary to DKA and diabetic gastroparesis. Continue antiemetic and supportive care.     Diabetic neuropathy, type I diabetes mellitus ([place]) Continue Lyrica.        AKI (acute kidney injury) ([place]) Secondary to dehydration. Improving and subsequent  labs.     Essential hypertension Stable. Lisinopril held in the setting of acute kidney injury.  Anxiety and depression Continue BuSpar        Code Status : Full code  Family Communication  : None at bedside  Disposition Plan  : Transfer to medical floor. Home possibly tomorrow if CBG was controlled  Barriers For Discharge : Active symptoms  Consults  :  None  Procedures  : None  DVT Prophylaxis  :  Lovenox -  Lab Results  Component Value Date   PLT 384 02/20/2016    Antibiotics  :    Anti-infectives    None        Objective:   Vitals:   02/20/16 0400 02/20/16 0600 02/20/16 0700 02/20/16 0800  BP: (!) 124/58 123/60  (!) 109/52  Pulse: 100 95 92 90  Resp: 16 15 15 15   Temp: 98.7 F (37.1 C)   98.4 F (36.9 C)  TempSrc: Oral   Oral  SpO2: 100% 100% 100% 100%  Weight:      Height:        Wt Readings from Last 3 Encounters:  02/19/16 61.1 kg (134 lb 11.2 oz)  02/18/16 64.9 kg (143 lb)  01/31/16 63.4 kg (139 lb 12.8 oz)     Intake/Output Summary (Last 24 hours) at 02/20/16 1054 Last data filed at 02/20/16 0800  Gross per 24 hour  Intake          2567.67 ml  Output              150 ml  Net          2417.67 ml     Physical Exam  Gen: Appears fatigued HEENT: Dry mucosa, supple neck Chest: clear b/l, no added sounds CVS: N S1&S2, no murmurs,  GI: soft, NT, ND, BS+ Musculoskeletal: warm, no edema     Data Review:    CBC  Recent Labs Lab 02/18/16 0132 02/19/16 1205 02/19/16 1214 02/20/16 0610  WBC 7.3 12.4*  --  10.8*  HGB 10.3* 9.0* 10.9* 8.7*  HCT 33.3* 30.7* 32.0* 28.8*  PLT 410* 505*  --  384  MCV 72.1* 76.4*  --  74.6*  MCH 22.3* 22.4*  --  22.5*  MCHC 30.9 29.3*  --  30.2  RDW 16.0* 16.9*  --  16.9*  LYMPHSABS  --  0.6*  --   --   MONOABS  --  0.2  --   --   EOSABS  --  0.0  --   --   BASOSABS  --  0.0  --   --     Chemistries   Recent Labs Lab 02/18/16 0132 02/19/16 1205 02/19/16 1214 02/19/16 1737  02/19/16 2206 02/20/16 0558 02/20/16 0923  NA 134* 137 136 139 139 139 138  K 4.6 5.7* 5.6* 5.0 4.2 4.4 3.8  CL 102 108 112* 115* 118* 116* 116*  CO2 19* 7*  --  10* 13* 16* 17*  GLUCOSE 389* 563* 570* 235* 108* 196* 118*  BUN 17 26* 23* 26* 23* 21* 20  CREATININE 1.12* 1.70* 1.20* 1.56* 1.41* 1.36* 1.19*  CALCIUM 9.6 9.3  --  8.9 8.4* 8.3* 8.2*  MG  --  2.2  2.3  --   --   --   --   --   AST 23 24  --   --   --   --   --   ALT 19 18  --   --   --   --   --   ALKPHOS 93 93  --   --   --   --   --   BILITOT 0.8 1.5*  --   --   --   --   --    ------------------------------------------------------------------------------------------------------------------ No results for input(s): CHOL, HDL, LDLCALC, TRIG, CHOLHDL, LDLDIRECT in the last 72 hours.  Lab Results  Component Value Date   HGBA1C 9.4 (H) 01/19/2016   ------------------------------------------------------------------------------------------------------------------  No results for input(s): TSH, T4TOTAL, T3FREE, THYROIDAB in the last 72 hours.  Invalid input(s): FREET3 ------------------------------------------------------------------------------------------------------------------ No results for input(s): VITAMINB12, FOLATE, FERRITIN, TIBC, IRON, RETICCTPCT in the last 72 hours.  Coagulation profile No results for input(s): INR, PROTIME in the last 168 hours.  No results for input(s): DDIMER in the last 72 hours.  Cardiac Enzymes No results for input(s): CKMB, TROPONINI, MYOGLOBIN in the last 168 hours.  Invalid input(s): CK ------------------------------------------------------------------------------------------------------------------ No results found for: BNP  Inpatient Medications  Scheduled Meds: . busPIRone  5 mg Oral TID  . enoxaparin (LOVENOX) injection  40 mg Subcutaneous Q24H  . insulin aspart  0-20 Units Subcutaneous Q4H  . insulin detemir  24 Units Subcutaneous Daily  . pregabalin  150 mg Oral  BID   Continuous Infusions: . dextrose 5 % and 0.45% NaCl 100 mL/hr at 02/20/16 [id number]  . insulin (NOVOLIN-R) infusion 0.5 Units/hr (02/20/16 0953)   PRN Meds:.albuterol, LORazepam, prochlorperazine  Micro Results No results found for this or any previous visit (from the past 240 hour(s)).  Radiology Reports Ir Fluoro Guide Cv Line Right  Result Date: 01/24/2016 INDICATION: Diabetic ketoacidosis, poor venous access. Central venous access requested for fluids and medications. EXAM: RIGHT UPPER EXTREMITY PICC LINE PLACEMENT WITH ULTRASOUND AND FLUOROSCOPIC GUIDANCE MEDICATIONS: 1% Nesacaine to skin and subcutaneous tissue ANESTHESIA/SEDATION: None FLUOROSCOPY TIME:  Fluoroscopy Time:  12 seconds COMPLICATIONS: None immediate. PROCEDURE: The patient was advised of the possible risks and complications and agreed to undergo the procedure. The patient was then brought to the angiographic suite for the procedure. The right arm was prepped with chlorhexidine, draped in the usual sterile fashion using maximum barrier technique (cap and mask, sterile gown, sterile gloves, large sterile sheet, hand hygiene and cutaneous antisepsis) and infiltrated locally with 1% Lidocaine. Ultrasound demonstrated patency of the right brachial vein, and this was documented with an image. Under real-time ultrasound guidance, this vein was accessed with a 21 gauge micropuncture needle and image documentation was performed. A 0.018 wire was introduced in to the vein. Over this, a 5 [place] double lumen power injectable PICC was advanced to the lower SVC/right atrial junction. Fluoroscopy during the procedure and fluoro spot radiograph confirms appropriate catheter position. The catheter was flushed and covered with a sterile dressing. Catheter length: 33 cm IMPRESSION: Successful right arm Power PICC line placement with ultrasound and fluoroscopic guidance. The catheter is ready for use. Read by: Rowe Robert, PA-C Electronically Signed    By: Markus Daft M.D.   On: 01/24/2016 16:06   Ir US Guide Vasc Access Right  Result Date: 01/24/2016 INDICATION: Diabetic ketoacidosis, poor venous access. Central venous access requested for fluids and medications. EXAM: RIGHT UPPER EXTREMITY PICC LINE PLACEMENT WITH ULTRASOUND AND FLUOROSCOPIC GUIDANCE MEDICATIONS: 1% Nesacaine to skin and subcutaneous tissue ANESTHESIA/SEDATION: None FLUOROSCOPY TIME:  Fluoroscopy Time:  12 seconds COMPLICATIONS: None immediate. PROCEDURE: The patient was advised of the possible risks and complications and agreed to undergo the procedure. The patient was then brought to the angiographic suite for the procedure. The right arm was prepped with chlorhexidine, draped in the usual sterile fashion using maximum barrier technique (cap and mask, sterile gown, sterile gloves, large sterile sheet, hand hygiene and cutaneous antisepsis) and infiltrated locally with 1% Lidocaine. Ultrasound demonstrated patency of the right brachial vein, and this was documented with an image. Under real-time ultrasound guidance, this vein was accessed with a 21 gauge micropuncture needle and image documentation was performed. A 0.018 wire was introduced in  to the vein. Over this, a 5 [place] double lumen power injectable PICC was advanced to the lower SVC/right atrial junction. Fluoroscopy during the procedure and fluoro spot radiograph confirms appropriate catheter position. The catheter was flushed and covered with a sterile dressing. Catheter length: 33 cm IMPRESSION: Successful right arm Power PICC line placement with ultrasound and fluoroscopic guidance. The catheter is ready for use. Read by: Rowe Robert, PA-C Electronically Signed   By: Markus Daft M.D.   On: 01/24/2016 16:06    Time Spent in minutes  25   Louellen Molder M.D on 02/20/2016 at 10:54 AM  Between 7am to 7pm - Pager - [phone]  After 7pm go to [email or web address] - password [facility]  [facility] -  Office   [phone]

## 2016-02-21 DIAGNOSIS — F418 Other specified anxiety disorders: Secondary | ICD-10-CM

## 2016-02-21 DIAGNOSIS — E1143 Type 2 diabetes mellitus with diabetic autonomic (poly)neuropathy: Secondary | ICD-10-CM | POA: Diagnosis not present

## 2016-02-21 DIAGNOSIS — E1010 Type 1 diabetes mellitus with ketoacidosis without coma: Secondary | ICD-10-CM | POA: Diagnosis not present

## 2016-02-21 DIAGNOSIS — N179 Acute kidney failure, unspecified: Secondary | ICD-10-CM | POA: Diagnosis not present

## 2016-02-21 DIAGNOSIS — I10 Essential (primary) hypertension: Secondary | ICD-10-CM

## 2016-02-21 LAB — BASIC METABOLIC PANEL
Anion gap: 5 (ref 5–15)
BUN: 12 mg/dL (ref 6–20)
CALCIUM: 8.3 mg/dL — AB (ref 8.9–10.3)
CO2: 18 mmol/L — AB (ref 22–32)
CREATININE: 0.99 mg/dL (ref 0.44–1.00)
Chloride: 112 mmol/L — ABNORMAL HIGH (ref 101–111)
GFR calc non Af Amer: >60 mL/min (ref >60)
Glucose, Bld: 178 mg/dL — ABNORMAL HIGH (ref 65–99)
Potassium: 3.9 mmol/L (ref 3.5–5.1)
SODIUM: 135 mmol/L (ref 135–145)

## 2016-02-21 LAB — GLUCOSE, CAPILLARY
GLUCOSE-CAPILLARY: 303 mg/dL — AB (ref 65–99)
Glucose-Capillary: 147 mg/dL — ABNORMAL HIGH (ref 65–99)
Glucose-Capillary: 330 mg/dL — ABNORMAL HIGH (ref 65–99)

## 2016-02-21 MED ORDER — INSULIN DETEMIR 100 UNIT/ML ~~LOC~~ SOLN: 28.0000 [IU] | Freq: Every day | SUBCUTANEOUS | 3 refills | Status: DC

## 2016-02-21 MED ORDER — INSULIN ASPART 100 UNIT/ML ~~LOC~~ SOLN
6.0000 [IU] | Freq: Three times a day (TID) | SUBCUTANEOUS | Status: DC
  Administered 2016-02-21: 6 [IU] via SUBCUTANEOUS

## 2016-02-21 MED ORDER — INSULIN DETEMIR 100 UNIT/ML ~~LOC~~ SOLN: 30.0000 [IU] | Freq: Every day | SUBCUTANEOUS | 3 refills | Status: DC

## 2016-02-21 NOTE — Discharge Summary (Addendum)
Physician Discharge Summary  [name] [id number] DOB: [birth year] DOA: 02/19/2016  PCP: [name], MD  Admit date: 02/19/2016 Discharge date: 02/21/2016  Admitted From: hime Disposition:  home  Recommendations for Outpatient Follow-up:  1. Follow up with PCP. ( pt reports having an appt on 8/30) . Recommend referring  patient to endocrinology.  Home Health:none Equipment/Devices: none  Discharge Condition: stable CODE STATUS: full code Diet recommendation: Carb Modified    Discharge Diagnoses:  principle Problem:   Diabetic ketoacidosis without coma associated with type 1 diabetes mellitus (HCC)   active problems   Nausea & vomiting   Diabetic neuropathy, type I diabetes mellitus (HCC)   Anxiety and depression   Diabetic gastroparesis (HCC)   Lactic acidosis   AKI (acute kidney injury) ([place])   Essential hypertension  Brief narrative  please refer to admission H&P for details, in brief, 50 year old female with history of type 1 diabetes mellitus with peripheral hospitalization for DKA secondary to noncompliance, diabetic gastroparesis, hypertension, hyperlipidemia and depression presented with abdominal pain, nausea and vomiting for past 2 days. Patient found to be in DKA and admitted to stepdown on glucose stabilizer.  Hospital course:  Active Problems:   Diabetic ketoacidosis without coma associated with type 1 diabetes mellitus ([place]) Monitored on stepdown with aggressive hydration and insulin drip. Anion gap closed and transitioned to levemir with premeal aspart. CBG better. No further N/V and tolerating diet. Will discharged on increased dose of levemir 30 units daily and continue sliding scale coverage.  Diabetic coordinator following. Counseled on diet and medication adherence. She will benefit from being referred to endocrinology for better control of her diabetes.    Nausea & vomiting Secondary to DKA and diabetic gastroparesis. Continue antiemetic  and supportive care.     Diabetic neuropathy, type I diabetes mellitus ([place]) Continue Lyrica.        AKI (acute kidney injury) ([place]) Secondary to dehydration. Improved with fluids.     Essential hypertension Stable. Lisinopril held in the setting of acute kidney injury. Resumed upon discharge.  Anxiety and depression Continue BuSpar         Family Communication  : None at bedside  Disposition Plan  : home Consults  :  None  Procedures  : None  Discharge Instructions     Medication List    TAKE these medications   albuterol 108 (90 Base) MCG/ACT inhaler Commonly known as:  PROVENTIL HFA;VENTOLIN HFA Inhale 2 puffs into the lungs every 6 (six) hours as needed for wheezing or shortness of breath.   busPIRone 5 MG tablet Commonly known as:  BUSPAR Take 1 tablet (5 mg total) by mouth 3 (three) times daily.   furosemide 40 MG tablet Commonly known as:  LASIX Take 40 mg by mouth daily as needed for fluid.   glucose blood test strip Commonly known as:  TRUE METRIX BLOOD GLUCOSE TEST Use 3 times daily before meals   insulin aspart 100 UNIT/ML injection Commonly known as:  novoLOG Inject 0-15 Units into the skin 3 (three) times daily with meals. Per sliding scale   insulin detemir 100 UNIT/ML injection Commonly known as:  LEVEMIR Inject 30 mLs (30 Units total) into the skin at bedtime. What changed:  how much to take   Insulin Syringe-Needle U-100 31G X 15/64" 0.5 ML Misc Commonly known as:  BD INSULIN SYRINGE ULTRAFINE 1 each by Does not apply route 4 (four) times daily as needed.   lisinopril 20 MG tablet Commonly known as:  PRINIVIL,ZESTRIL Take 10 mg by mouth daily.   metoCLOPramide 5 MG/5ML solution Commonly known as:  REGLAN Take 5 mLs (5 mg total) by mouth 4 (four) times daily -  before meals and at bedtime.   ondansetron 4 MG disintegrating tablet Commonly known as:  ZOFRAN ODT Take 1 tablet (4 mg total) by mouth every 8  (eight) hours as needed for nausea.   polyethylene glycol powder powder Commonly known as:  GLYCOLAX/MIRALAX Take 1 Container by mouth daily as needed for moderate constipation. Reported on 07/19/2015   pregabalin 150 MG capsule Commonly known as:  LYRICA Take 1 capsule (150 mg total) by mouth 2 (two) times daily.   TRUE METRIX METER Devi 1 each by Does not apply route 3 (three) times daily before meals.      Follow-up Information    [name], MD Follow up in 1 week(s).   Specialty:  Family Medicine Contact information: [address] [place] [address] [phone]          Allergies  Allergen Reactions  . Anesthetics, Amide Nausea And Vomiting  . Penicillins Diarrhea and Nausea And Vomiting    Has patient had a PCN reaction causing immediate rash, facial/tongue/throat swelling, SOB or lightheadedness with hypotension: No Has patient had a PCN reaction causing severe rash involving mucus membranes or skin necrosis: No Has patient had a PCN reaction that required hospitalization No Has patient had a PCN reaction occurring within the last 10 years: Yes  If all of the above answers are "NO", then may proceed with Cephalosporin use.   . Buprenorphine Hcl Rash  . Encainide Nausea And Vomiting      Procedures/Studies: Ir Fluoro Guide Cv Line Right  Result Date: 01/24/2016 INDICATION: Diabetic ketoacidosis, poor venous access. Central venous access requested for fluids and medications. EXAM: RIGHT UPPER EXTREMITY PICC LINE PLACEMENT WITH ULTRASOUND AND FLUOROSCOPIC GUIDANCE MEDICATIONS: 1% Nesacaine to skin and subcutaneous tissue ANESTHESIA/SEDATION: None FLUOROSCOPY TIME:  Fluoroscopy Time:  12 seconds COMPLICATIONS: None immediate. PROCEDURE: The patient was advised of the possible risks and complications and agreed to undergo the procedure. The patient was then brought to the angiographic suite for the procedure. The right arm was prepped with chlorhexidine,  draped in the usual sterile fashion using maximum barrier technique (cap and mask, sterile gown, sterile gloves, large sterile sheet, hand hygiene and cutaneous antisepsis) and infiltrated locally with 1% Lidocaine. Ultrasound demonstrated patency of the right brachial vein, and this was documented with an image. Under real-time ultrasound guidance, this vein was accessed with a 21 gauge micropuncture needle and image documentation was performed. A 0.018 wire was introduced in to the vein. Over this, a 5 [place] double lumen power injectable PICC was advanced to the lower SVC/right atrial junction. Fluoroscopy during the procedure and fluoro spot radiograph confirms appropriate catheter position. The catheter was flushed and covered with a sterile dressing. Catheter length: 33 cm IMPRESSION: Successful right arm Power PICC line placement with ultrasound and fluoroscopic guidance. The catheter is ready for use. Read by: Rowe Robert, PA-C Electronically Signed   By: Markus Daft M.D.   On: 01/24/2016 16:06   Ir US Guide Vasc Access Right  Result Date: 01/24/2016 INDICATION: Diabetic ketoacidosis, poor venous access. Central venous access requested for fluids and medications. EXAM: RIGHT UPPER EXTREMITY PICC LINE PLACEMENT WITH ULTRASOUND AND FLUOROSCOPIC GUIDANCE MEDICATIONS: 1% Nesacaine to skin and subcutaneous tissue ANESTHESIA/SEDATION: None FLUOROSCOPY TIME:  Fluoroscopy Time:  12 seconds COMPLICATIONS: None immediate. PROCEDURE: The patient  was advised of the possible risks and complications and agreed to undergo the procedure. The patient was then brought to the angiographic suite for the procedure. The right arm was prepped with chlorhexidine, draped in the usual sterile fashion using maximum barrier technique (cap and mask, sterile gown, sterile gloves, large sterile sheet, hand hygiene and cutaneous antisepsis) and infiltrated locally with 1% Lidocaine. Ultrasound demonstrated patency of the right brachial  vein, and this was documented with an image. Under real-time ultrasound guidance, this vein was accessed with a 21 gauge micropuncture needle and image documentation was performed. A 0.018 wire was introduced in to the vein. Over this, a 5 [place] double lumen power injectable PICC was advanced to the lower SVC/right atrial junction. Fluoroscopy during the procedure and fluoro spot radiograph confirms appropriate catheter position. The catheter was flushed and covered with a sterile dressing. Catheter length: 33 cm IMPRESSION: Successful right arm Power PICC line placement with ultrasound and fluoroscopic guidance. The catheter is ready for use. Read by: Rowe Robert, PA-C Electronically Signed   By: Markus Daft M.D.   On: 01/24/2016 16:06     Subjective: Feels better. No N/V  Discharge Exam: Vitals:   02/20/16 2018 02/21/16 0525  BP: 114/62 (!) 107/56  Pulse: 82 88  Resp: 18 16  Temp: 97.8 F (36.6 C) 98.6 F (37 C)   Vitals:   02/20/16 1100 02/20/16 1207 02/20/16 2018 02/21/16 0525  BP:  122/63 114/62 (!) 107/56  Pulse: 90 92 82 88  Resp: 15 18 18 16   Temp:  98.3 F (36.8 C) 97.8 F (36.6 C) 98.6 F (37 C)  TempSrc:  Oral  Oral  SpO2: 100% 96%  97%  Weight:      Height:        Gen: Not in distress HEENT: moist mucosa, supple neck Chest: clear b/l, no added sounds CVS: N S1&S2, no murmurs,  GI: soft, NT, ND, BS+ Musculoskeletal: warm, no edema     The results of significant diagnostics from this hospitalization (including imaging, microbiology, ancillary and laboratory) are listed below for reference.     Microbiology: No results found for this or any previous visit (from the past 240 hour(s)).   Labs: BNP (last 3 results) No results for input(s): BNP in the last 8760 hours. Basic Metabolic Panel:  Recent Labs Lab 02/19/16 1205  02/19/16 1737 02/19/16 2206 02/20/16 0558 02/20/16 0923 02/21/16 0512  NA 137  < > 139 139 139 138 135  K 5.7*  < > 5.0 4.2 4.4  3.8 3.9  CL 108  < > 115* 118* 116* 116* 112*  CO2 7*  --  10* 13* 16* 17* 18*  GLUCOSE 563*  < > 235* 108* 196* 118* 178*  BUN 26*  < > 26* 23* 21* 20 12  CREATININE 1.70*  < > 1.56* 1.41* 1.36* 1.19* 0.99  CALCIUM 9.3  --  8.9 8.4* 8.3* 8.2* 8.3*  MG 2.2  2.3  --   --   --   --   --   --   PHOS 5.5*  --   --   --   --   --   --   < > = values in this interval not displayed. Liver Function Tests:  Recent Labs Lab 02/18/16 0132 02/19/16 1205  AST 23 24  ALT 19 18  ALKPHOS 93 93  BILITOT 0.8 1.5*  PROT 8.0 8.2*  ALBUMIN 4.3 4.3    Recent Labs Lab 02/18/16  0132  LIPASE 28   No results for input(s): AMMONIA in the last 168 hours. CBC:  Recent Labs Lab 02/18/16 0132 02/19/16 1205 02/19/16 1214 02/20/16 0610  WBC 7.3 12.4*  --  10.8*  NEUTROABS  --  11.6*  --   --   HGB 10.3* 9.0* 10.9* 8.7*  HCT 33.3* 30.7* 32.0* 28.8*  MCV 72.1* 76.4*  --  74.6*  PLT 410* 505*  --  384   Cardiac Enzymes: No results for input(s): CKTOTAL, CKMB, CKMBINDEX, TROPONINI in the last 168 hours. BNP: Invalid input(s): POCBNP CBG:  Recent Labs Lab 02/20/16 1709 02/20/16 2208 02/20/16 2359 02/21/16 0420 02/21/16 0736  GLUCAP 208* 111* 115* 147* 303*   D-Dimer No results for input(s): DDIMER in the last 72 hours. Hgb A1c No results for input(s): HGBA1C in the last 72 hours. Lipid Profile No results for input(s): CHOL, HDL, LDLCALC, TRIG, CHOLHDL, LDLDIRECT in the last 72 hours. Thyroid function studies No results for input(s): TSH, T4TOTAL, T3FREE, THYROIDAB in the last 72 hours.  Invalid input(s): FREET3 Anemia work up No results for input(s): VITAMINB12, FOLATE, FERRITIN, TIBC, IRON, RETICCTPCT in the last 72 hours. Urinalysis    Component Value Date/Time   COLORURINE RED (A) 02/19/2016 2155   APPEARANCEUR TURBID (A) 02/19/2016 2155   LABSPEC 1.024 02/19/2016 2155   PHURINE 5.0 02/19/2016 2155   GLUCOSEU >1000 (A) 02/19/2016 2155   HGBUR LARGE (A) 02/19/2016 2155    BILIRUBINUR MODERATE (A) 02/19/2016 2155   BILIRUBINUR NEGATIVE 08/04/2012 1005   KETONESUR >80 (A) 02/19/2016 2155   PROTEINUR 100 (A) 02/19/2016 2155   UROBILINOGEN 0.2 04/27/2015 1608   NITRITE NEGATIVE 02/19/2016 2155   LEUKOCYTESUR MODERATE (A) 02/19/2016 2155   Sepsis Labs Invalid input(s): PROCALCITONIN,  WBC,  LACTICIDVEN Microbiology No results found for this or any previous visit (from the past 240 hour(s)).   Time coordinating discharge: Over 30 minutes  SIGNED:   Louellen Molder, MD  [facility] 02/21/2016, 8:47 AM Pager   If 7PM-7AM, please contact night-coverage [email or web address] Password [email or web address]

## 2016-02-21 NOTE — Hospital Discharge Follow-Up (Signed)
The patient is known to the [facility] at the [facility]. Her appointment has been re-scheduled due to her hospitalization.  The appointment is now 02/28/16 @ 0900 and the information has been placed on the AVS.   Myraette McGibboney, RN CM updated that the appointment would be re-scheduled.

## 2016-02-21 NOTE — Discharge Instructions (Signed)
Blood Glucose Monitoring, Adult ° °Monitoring your blood glucose (also know as blood sugar) helps you to manage your diabetes. It also helps you and your health care provider monitor your diabetes and determine how well your treatment plan is working. °WHY SHOULD YOU MONITOR YOUR BLOOD GLUCOSE? °· It can help you understand how food, exercise, and medicine affect your blood glucose. °· It allows you to know what your blood glucose is at any given moment. You can quickly tell if you are having low blood glucose (hypoglycemia) or high blood glucose (hyperglycemia). °· It can help you and your health care provider know how to adjust your medicines. °· It can help you understand how to manage an illness or adjust medicine for exercise. °WHEN SHOULD YOU TEST? °Your health care provider will help you decide how often you should check your blood glucose. This may depend on the type of diabetes you have, your diabetes control, or the types of medicines you are taking. Be sure to write down all of your blood glucose readings so that this information can be reviewed with your health care provider. See below for examples of testing times that your health care provider may suggest. °Type 1 Diabetes °· Test at least 2 times per day if your diabetes is well controlled, if you are using an insulin pump, or if you perform multiple daily injections. °· If your diabetes is not well controlled or if you are sick, you may need to test more often. °· It is a good idea to also test: °¨ Before every insulin injection. °¨ Before and after exercise. °¨ Between meals and 2 hours after a meal. °¨ Occasionally between 2:00 a.m. and 3:00 a.m. °Type 2 Diabetes °· If you are taking insulin, test at least 2 times per day. However, it is best to test before every insulin injection. °· If you take medicines by mouth (orally), test 2 times a day. °· If you are on a controlled diet, test once a day. °· If your diabetes is not well controlled or if you  are sick, you may need to monitor more often. °HOW TO MONITOR YOUR BLOOD GLUCOSE °Supplies Needed °· Blood glucose meter. °· Test strips for your meter. Each meter has its own strips. You must use the strips that go with your own meter. °· A pricking needle (lancet). °· A device that holds the lancet (lancing device). °· A journal or log book to write down your results. °Procedure °· Wash your hands with soap and water. Alcohol is not preferred. °· Prick the side of your finger (not the tip) with the lancet. °· Gently milk the finger until a small drop of blood appears. °· Follow the instructions that come with your meter for inserting the test strip, applying blood to the strip, and using your blood glucose meter. °Other Areas to Get Blood for Testing °Some meters allow you to use other areas of your body (other than your finger) to test your blood. These areas are called alternative sites. The most common alternative sites are: °· The forearm. °· The thigh. °· The back area of the lower leg. °· The palm of the hand. °The blood flow in these areas is slower. Therefore, the blood glucose values you get may be delayed, and the numbers are different from what you would get from your fingers. Do not use alternative sites if you think you are having hypoglycemia. Your reading will not be accurate. Always use a finger if you   are having hypoglycemia. Also, if you cannot feel your lows (hypoglycemia unawareness), always use your fingers for your blood glucose checks. °ADDITIONAL TIPS FOR GLUCOSE MONITORING °· Do not reuse lancets. °· Always carry your supplies with you. °· All blood glucose meters have a 24-hour "hotline" number to call if you have questions or need help. °· Adjust (calibrate) your blood glucose meter with a control solution after finishing a few boxes of strips. °BLOOD GLUCOSE RECORD KEEPING °It is a good idea to keep a daily record or log of your blood glucose readings. Most glucose meters, if not all,  keep your glucose records stored in the meter. Some meters come with the ability to download your records to your home computer. Keeping a record of your blood glucose readings is especially helpful if you are wanting to look for patterns. Make notes to go along with the blood glucose readings because you might forget what happened at that exact time. Keeping good records helps you and your health care provider to work together to achieve good diabetes management.  °  °This information is not intended to replace advice given to you by your health care provider. Make sure you discuss any questions you have with your health care provider. °  °Document Released: 06/14/2003 Document Revised: 07/02/2014 Document Reviewed: 11/03/2012 °Elsevier Interactive Patient Education ©2016 [employer] ° °

## 2016-02-22 ENCOUNTER — Telehealth: Payer: Self-pay

## 2016-02-22 ENCOUNTER — Ambulatory Visit: Payer: 59 | Admitting: Family Medicine

## 2016-02-22 NOTE — Telephone Encounter (Signed)
[facility] Post-discharge Follow-Up Phone Call:  Date of Discharge: 02/21/16  Principal Discharge Diagnosis(es): Diabetic ketoacidosis without coma associate with type 1 diabetes mellitus Post-discharge Communication: Attempt #1 to reach patient and complete post-discharge follow-up phone call. Call placed to [phone]; unable to reach patient. HIPPA compliant voicemail left requesting return call. Call Completed: No

## 2016-02-23 ENCOUNTER — Telehealth: Payer: Self-pay

## 2016-02-23 NOTE — Telephone Encounter (Signed)
[facility] Post-discharge Follow-Up Phone Call:  Date of Discharge: 02/21/16 Principal Discharge Diagnosis(es): Diabetic ketoacidosis without coma associated with type 1 diabetes mellitus Post-discharge Communication: Attempt #2 to reach patient and complete post-discharge follow-up phone call. Call placed to [phone]; unable to reach patient. HIPPA compliant voicemail left requesting return call. Call completed: No

## 2016-02-24 ENCOUNTER — Telehealth: Payer: Self-pay

## 2016-02-24 NOTE — Telephone Encounter (Signed)
[facility] Post-discharge Follow-Up Phone Call:  Date of Discharge: 02/21/16 Principal Discharge Diagnosis(es): Diabetic ketoacidosis without coma associated with type 1 diabetes mellitus Post-discharge Communication: Attempt #3 to reach patient and complete post-discharge follow-up phone call. Call placed to [phone]; unable to reach. An additional HIPPA compliant voicemail left requesting return call.  Call Completed: No

## 2016-02-28 ENCOUNTER — Inpatient Hospital Stay: Payer: 59 | Admitting: Family Medicine

## 2016-02-28 IMAGING — CR DG CHEST 2V
2 series · 2 of 2 positions shown · non-contrast
Comparison: 05/08/2015

CLINICAL DATA: N/V today; no chest complaints today, pleural
effusion seen on previous Abdomen CT that was done this morning per
Pa's note; hx asthma; non smoker; HTn; diabetic

EXAM:
CHEST  2 VIEW

[w chest pa]
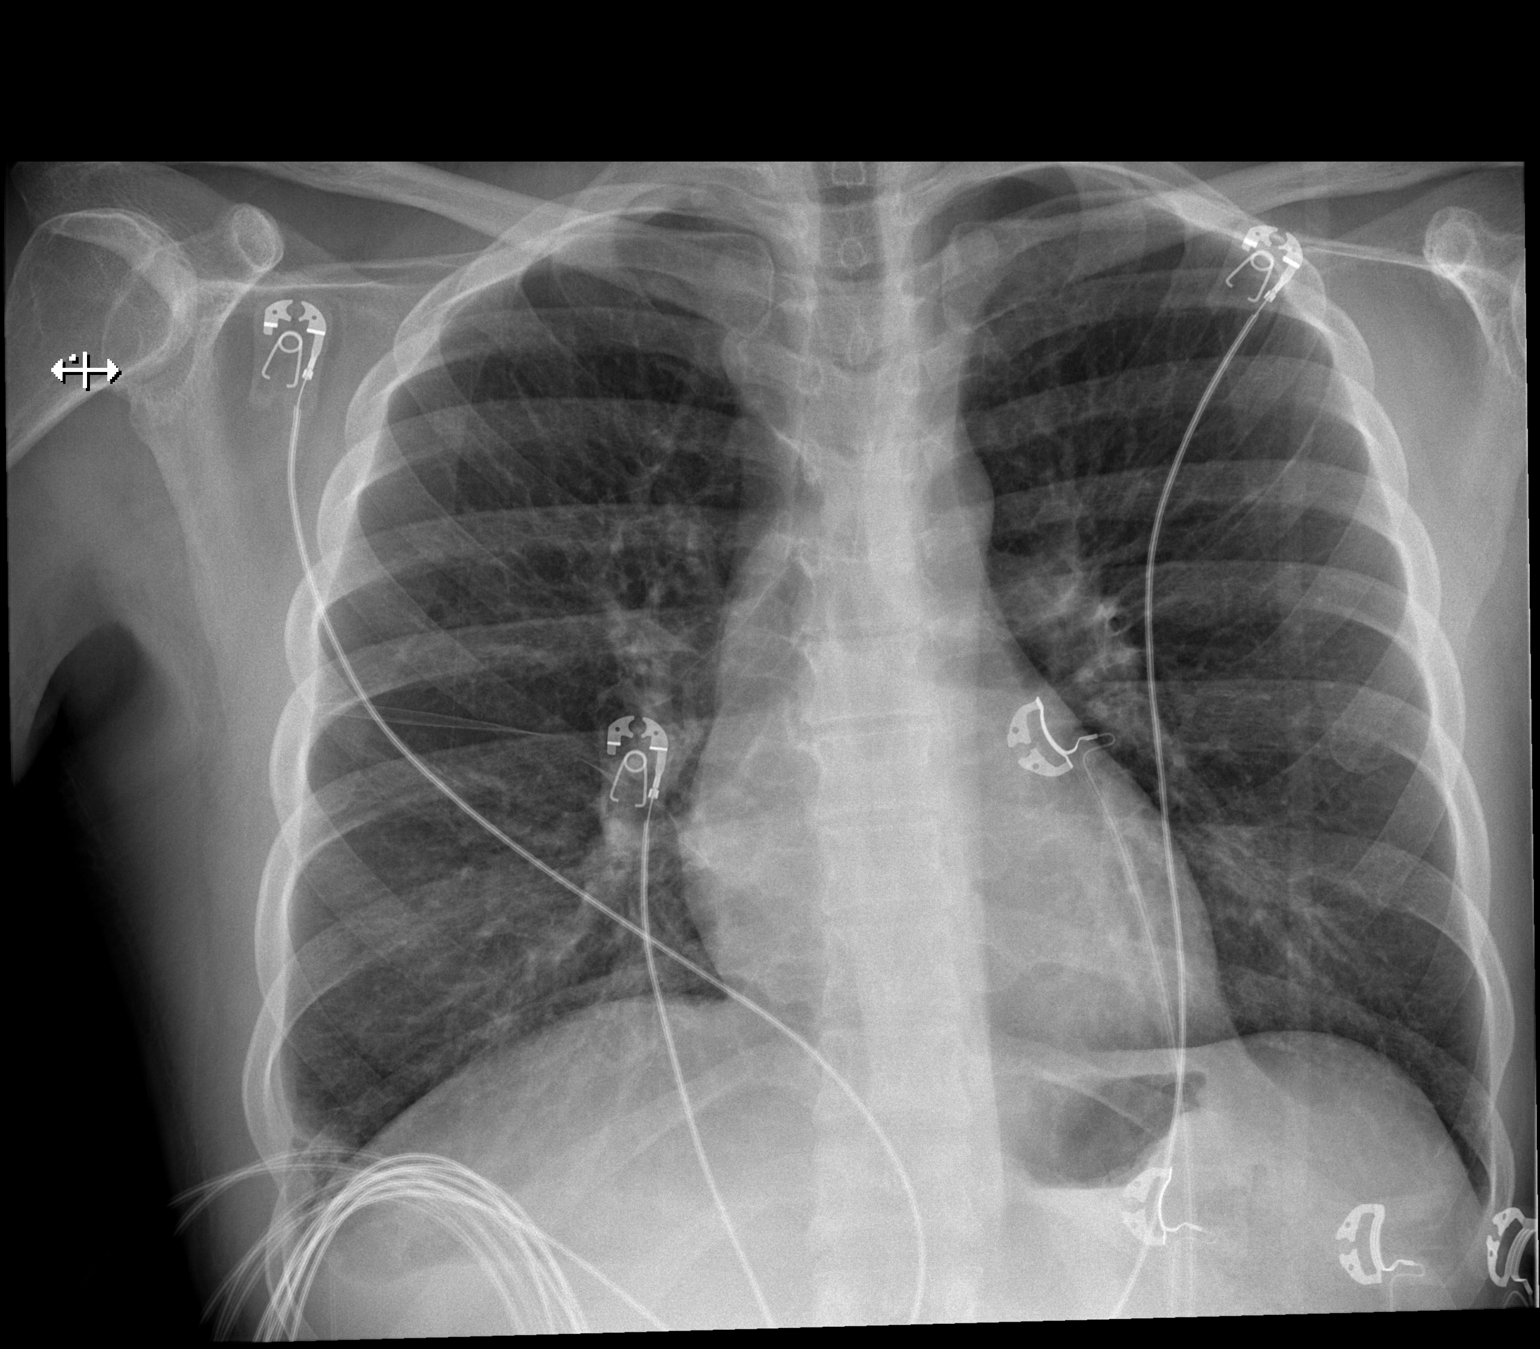

[w chest lat]
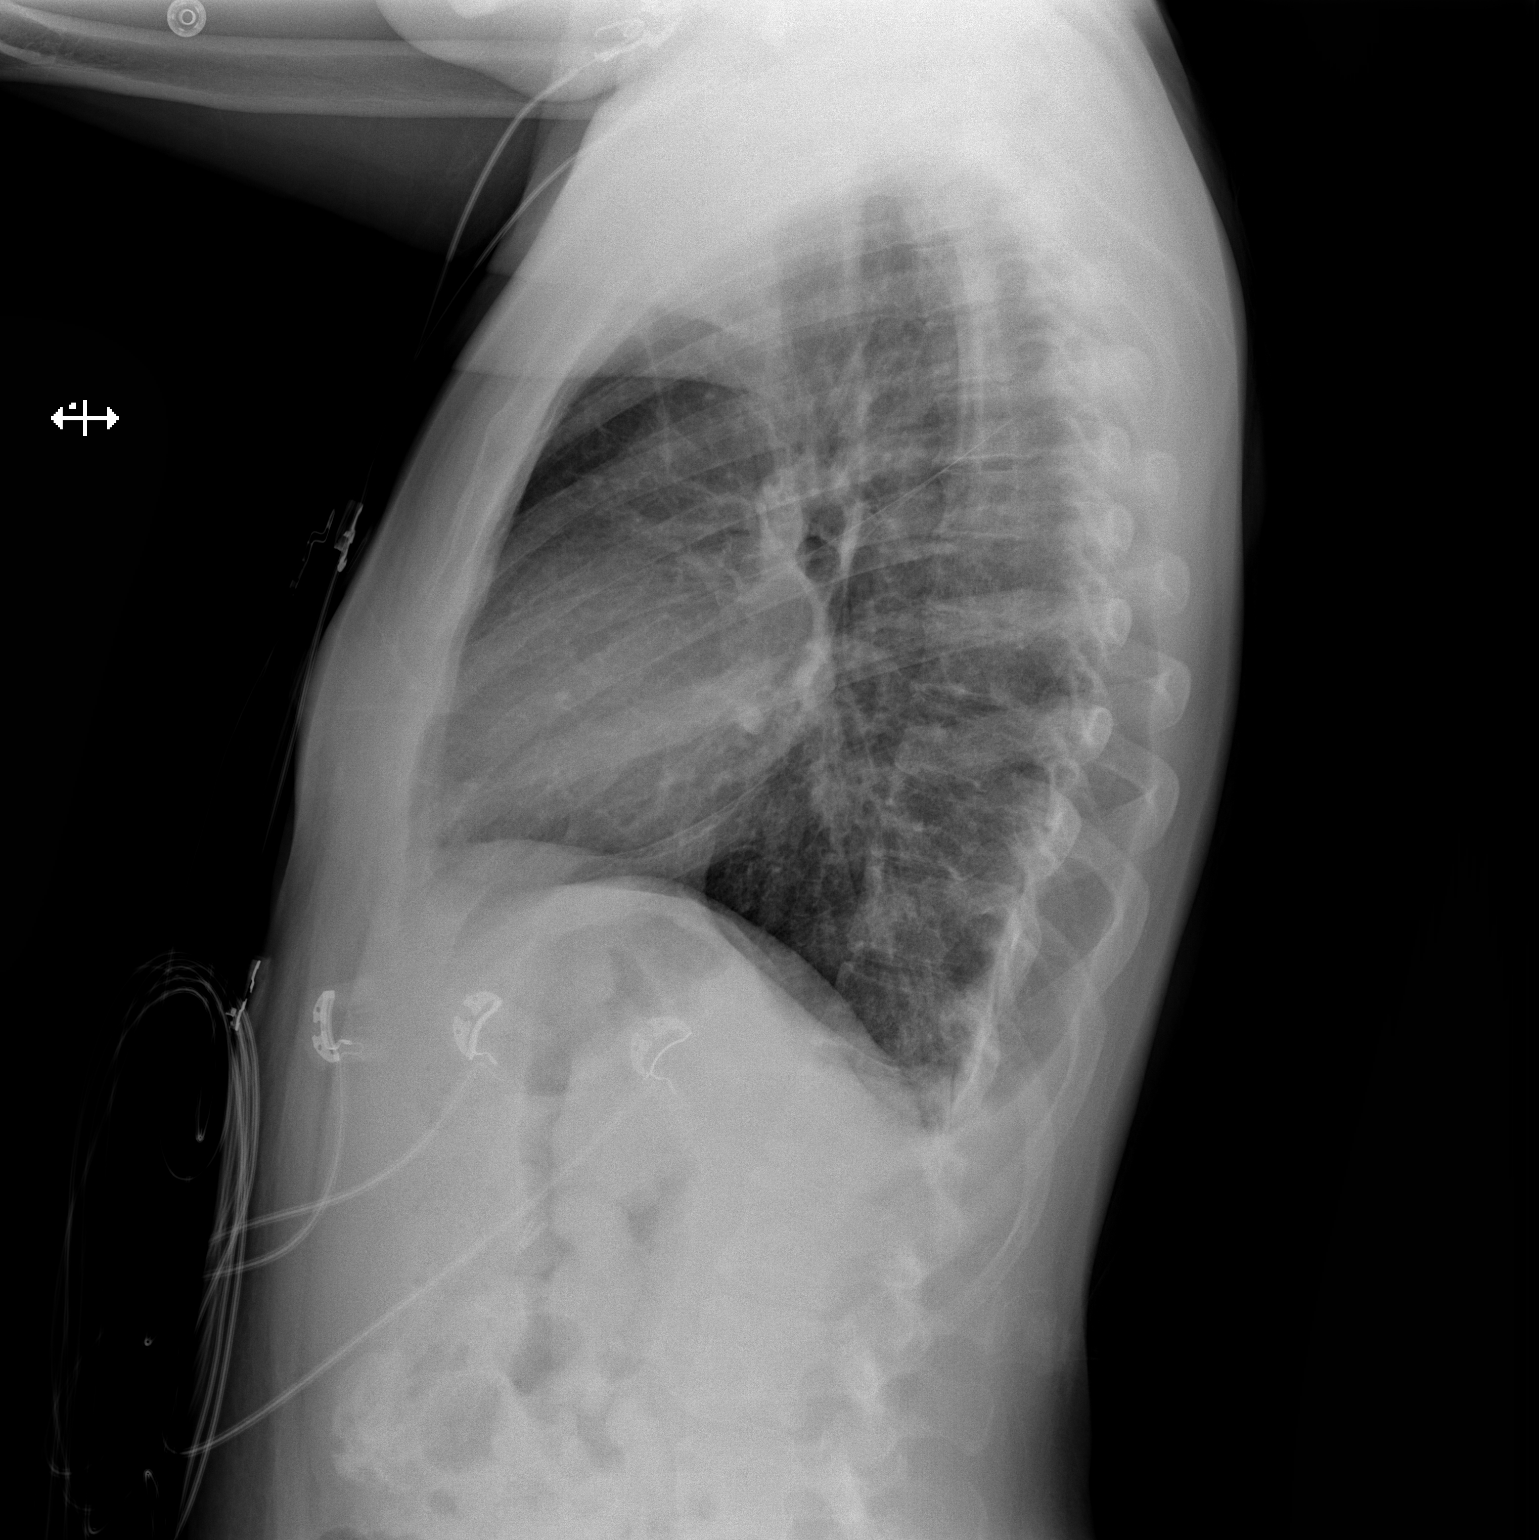

[2 of 2 positions shown; findings below may reference images not displayed]

FINDINGS: The heart size and mediastinal contours are within normal limits.
Both lungs are clear. No pleural effusion or pneumothorax. The
visualized skeletal structures are unremarkable.
IMPRESSION: No active cardiopulmonary disease.

## 2016-02-28 IMAGING — CT CT ABD-PELV W/ CM
2 of 5 series · 16 of 46 positions shown, 18 images · IV contrast (OMNIPAQUE 300)
Comparison: Radiograph dated 05/08/2015 and CT scan dated
03/10/2015

CLINICAL DATA: Abdominal pain and nausea and vomiting. Elevated
lipase.

EXAM:
CT ABDOMEN AND PELVIS WITH CONTRAST
TECHNIQUE: Multidetector CT imaging of the abdomen and pelvis was performed
using the standard protocol following bolus administration of
intravenous contrast.
CONTRAST:  50mL OMNIPAQUE IOHEXOL 300 MG/ML SOLN, 100mL OMNIPAQUE
IOHEXOL 300 MG/ML SOLN

[Series 2: abd/pel with · axial · 0.72mm/px · z∈[-432,-62]mm · 13 of 84 slices shown, 15 images]
[im 5/84  soft-tissue]
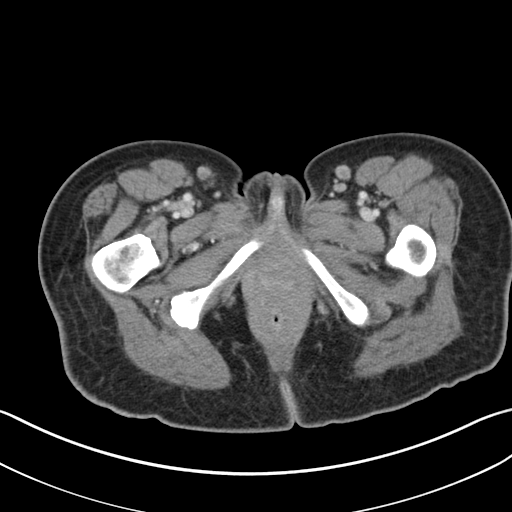
[im 5/84  bone]
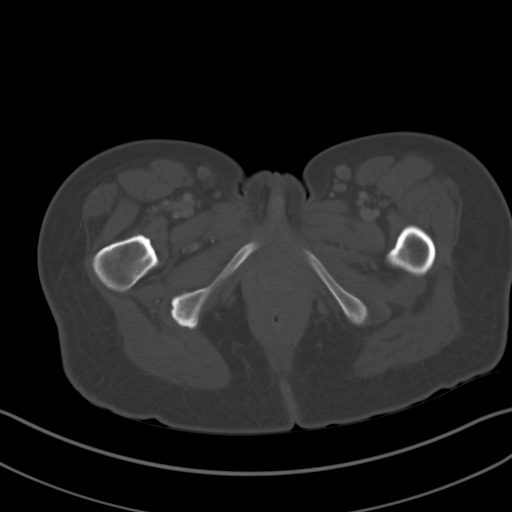
[im 10/84  soft-tissue]
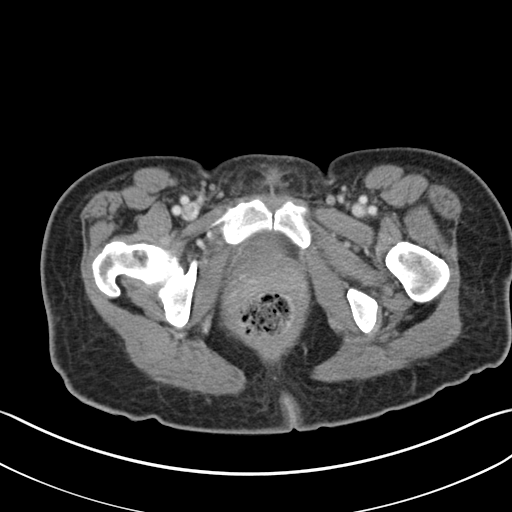
[im 19/84  soft-tissue]
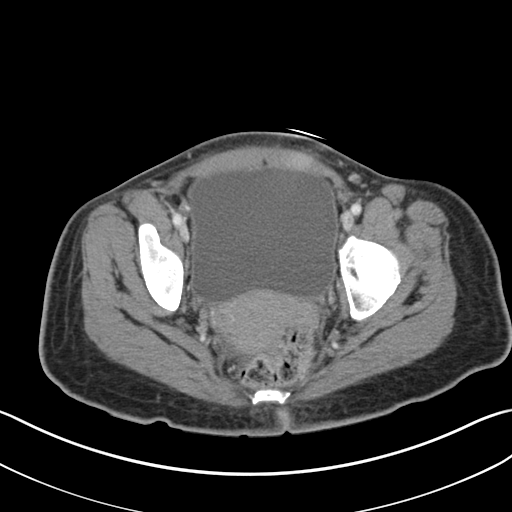
[im 24/84  soft-tissue]
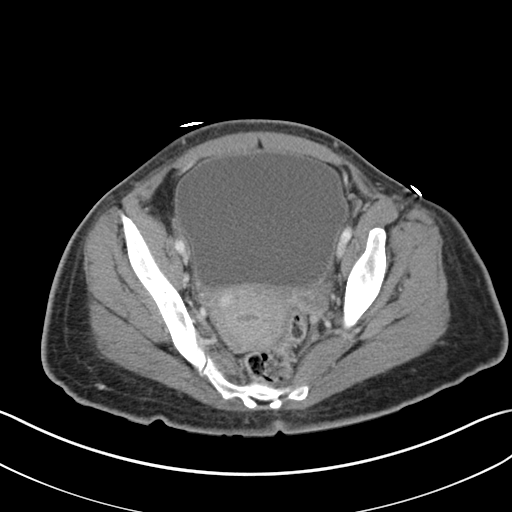
[im 28/84  soft-tissue]
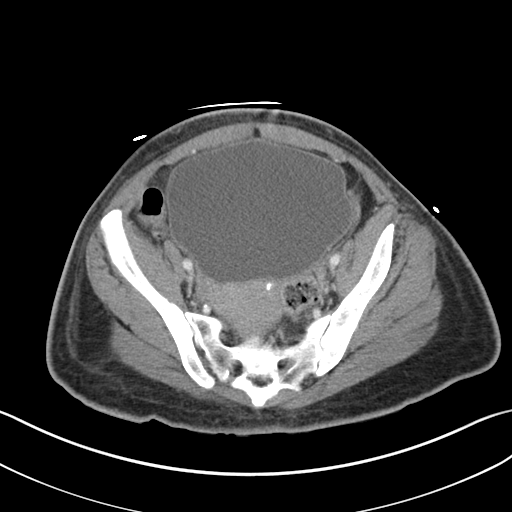
[im 37/84  soft-tissue]
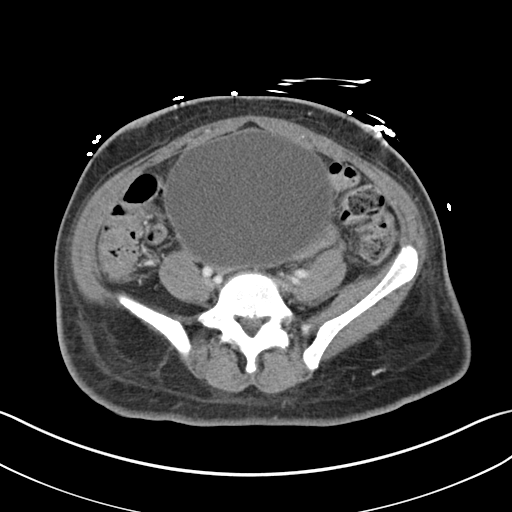
[im 42/84  soft-tissue]
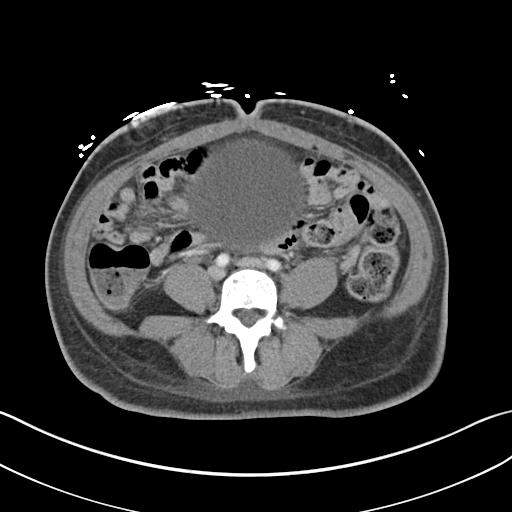
[im 47/84  soft-tissue]
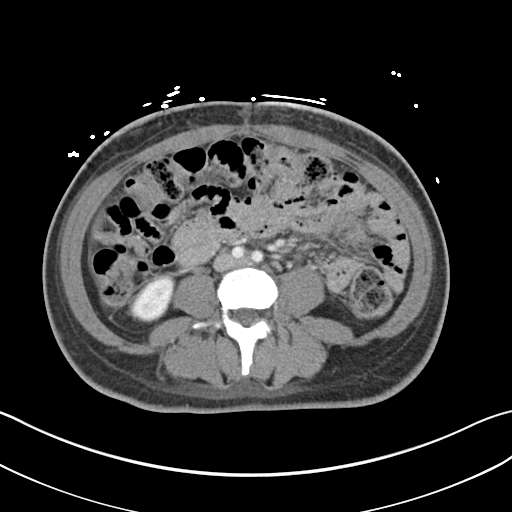
[im 56/84  soft-tissue]
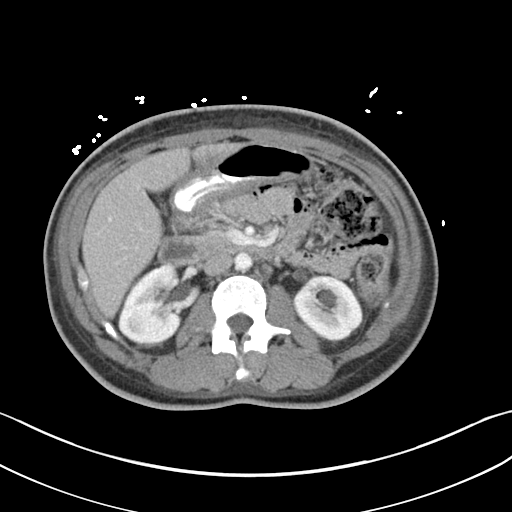
[im 56/84  bone]
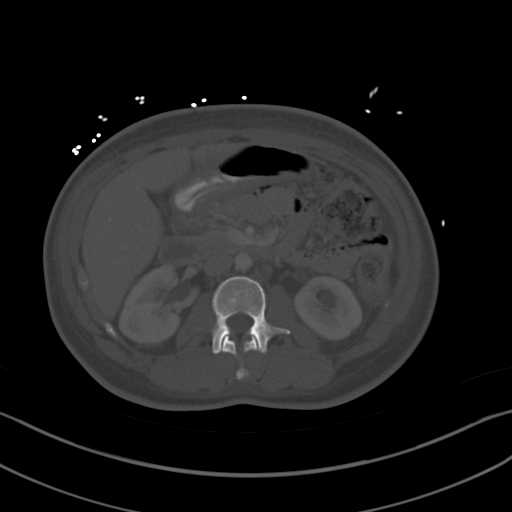
[im 60/84  soft-tissue]
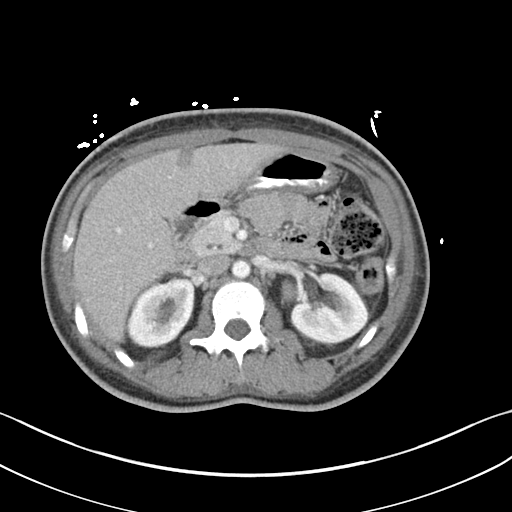
[im 65/84  soft-tissue]
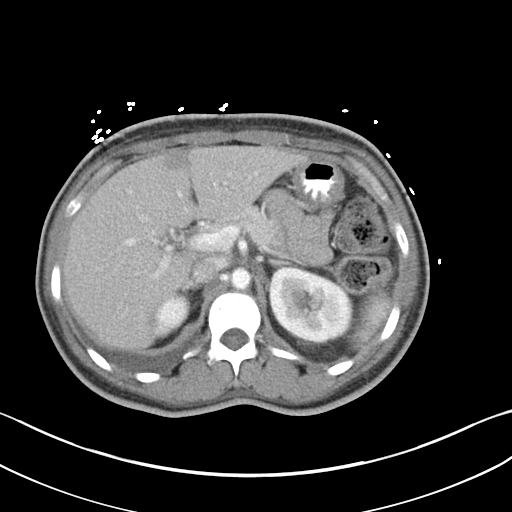
[im 74/84  soft-tissue]
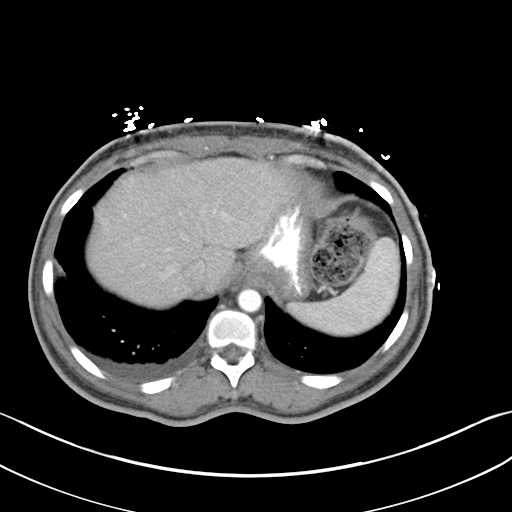
[im 79/84  soft-tissue]
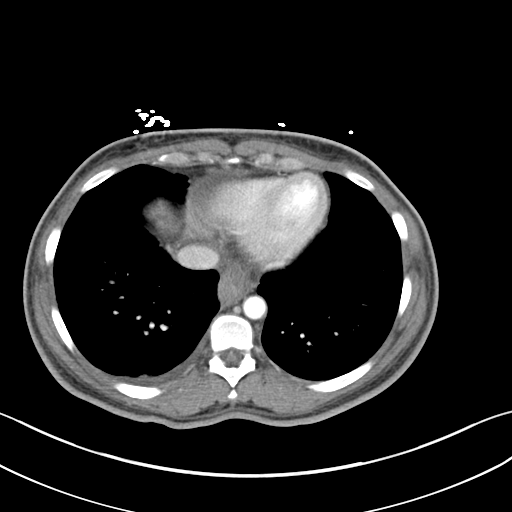

[Series 5: coronal a/|p · coronal · 0.72mm/px · 3 of 84 slices shown]
[im 28/84  soft-tissue]
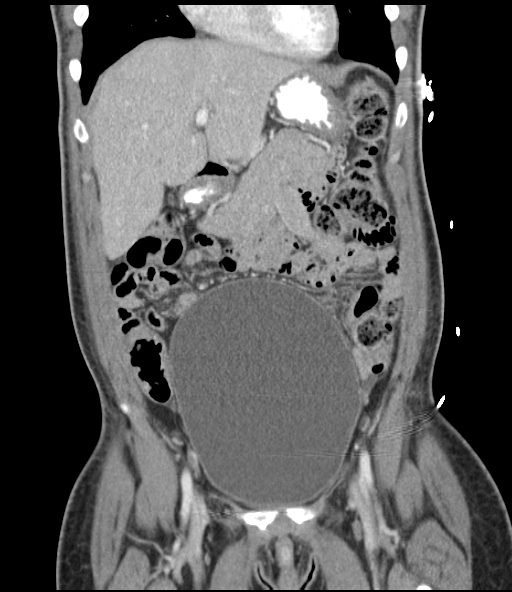
[im 37/84  soft-tissue]
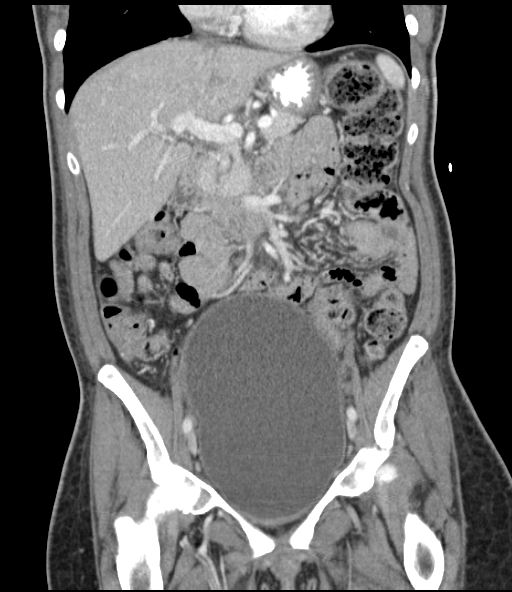
[im 47/84  soft-tissue]
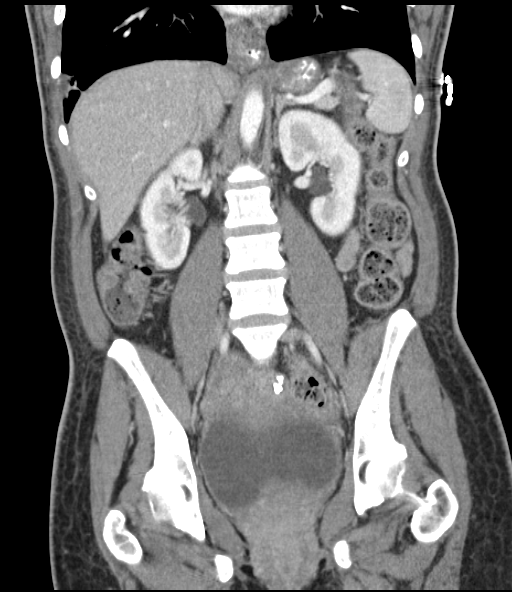

[16 of 46 positions shown; findings below may reference images not displayed]

FINDINGS: Lower chest: There is a new small right pleural effusion with slight
atelectasis at the right lung base laterally and posteriorly. Small
chronic hiatal hernia.

Hepatobiliary: Cholecystectomy. Focal fatty infiltration in the left
lobe adjacent to the falciform ligament. No biliary ductal
dilatation.

Pancreas: Normal.

Spleen: Normal.

Adrenals/Urinary Tract: Normal adrenal glands and kidneys. Slight
prominence of the ureters. Chronic distention of the bladder to the
level of the umbilicus. The bladder has been abnormally distended
dating back to 03/18/2010.

Stomach/Bowel: Small hiatal hernia. Extensive stool in the
nondistended colon. The sigmoid portion of the colon is compressed
by the distended urinary bladder. There is stool in the rectum.

Vascular/Lymphatic: Minimal calcification in the abdominal aorta. No
adenopathy.

Reproductive: 12 mm cyst on the left ovary. Small calcified fibroid
in the left side of the uterine fundus.

Other: No free air or free fluid.

Musculoskeletal: Normal.
IMPRESSION: 1. New small right pleural effusion with slight atelectasis at the
right lung base.
2. Marked chronic distention of the urinary bladder with secondary
slight dilatation of the ureters. The sigmoid portion of the colon
is also compressed by the distended urinary bladder.
3. Normal appearing pancreas.

## 2016-03-02 IMAGING — CR DG CHEST 1V PORT
1 series · 1 of 1 positions shown · non-contrast
Comparison: Chest radiograph performed earlier today at [DATE] a.m.

CLINICAL DATA: Right IJ line placement.  Initial encounter.

EXAM:
PORTABLE CHEST 1 VIEW

[AP]
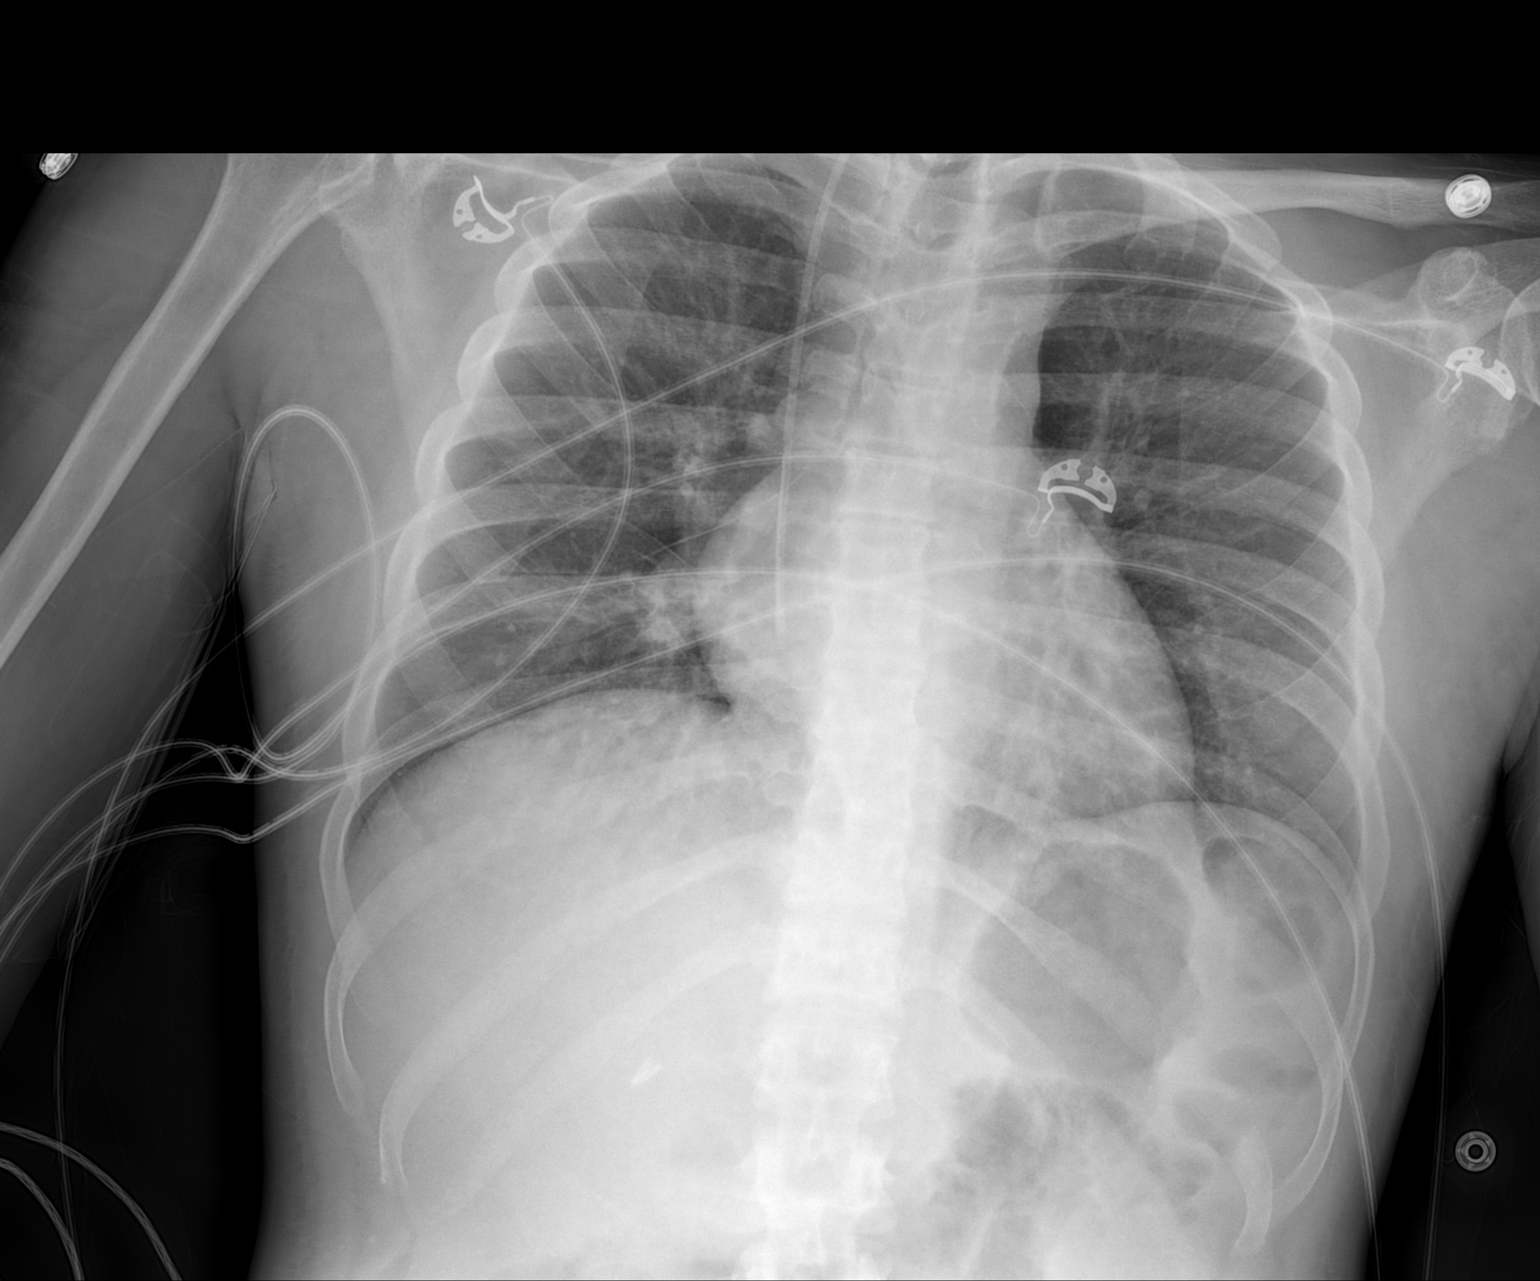

[1 of 1 positions shown; findings below may reference images not displayed]

FINDINGS: The patient's right IJ line is noted ending overlying the right
atrium. This could be retracted approximately 3 cm to the cavoatrial
junction.

The lungs are relatively well expanded and appear grossly clear. No
focal consolidation, pleural effusion or pneumothorax is seen.

The cardiomediastinal silhouette is normal in size. No acute osseous
abnormalities are identified. Clips are noted within the right upper
quadrant, reflecting prior cholecystectomy.
IMPRESSION: 1. Right IJ line noted ending overlying the right atrium. This could
be retracted approximately 3 cm to the cavoatrial junction.
2. No acute cardiopulmonary process seen.

## 2016-03-02 IMAGING — CR DG CHEST 2V
2 series · 2 of 2 positions shown · non-contrast
Comparison: 05/14/2015

CLINICAL DATA: Nausea, vomiting since last night.  DKA.

EXAM:
CHEST  2 VIEW

[x chest ap]
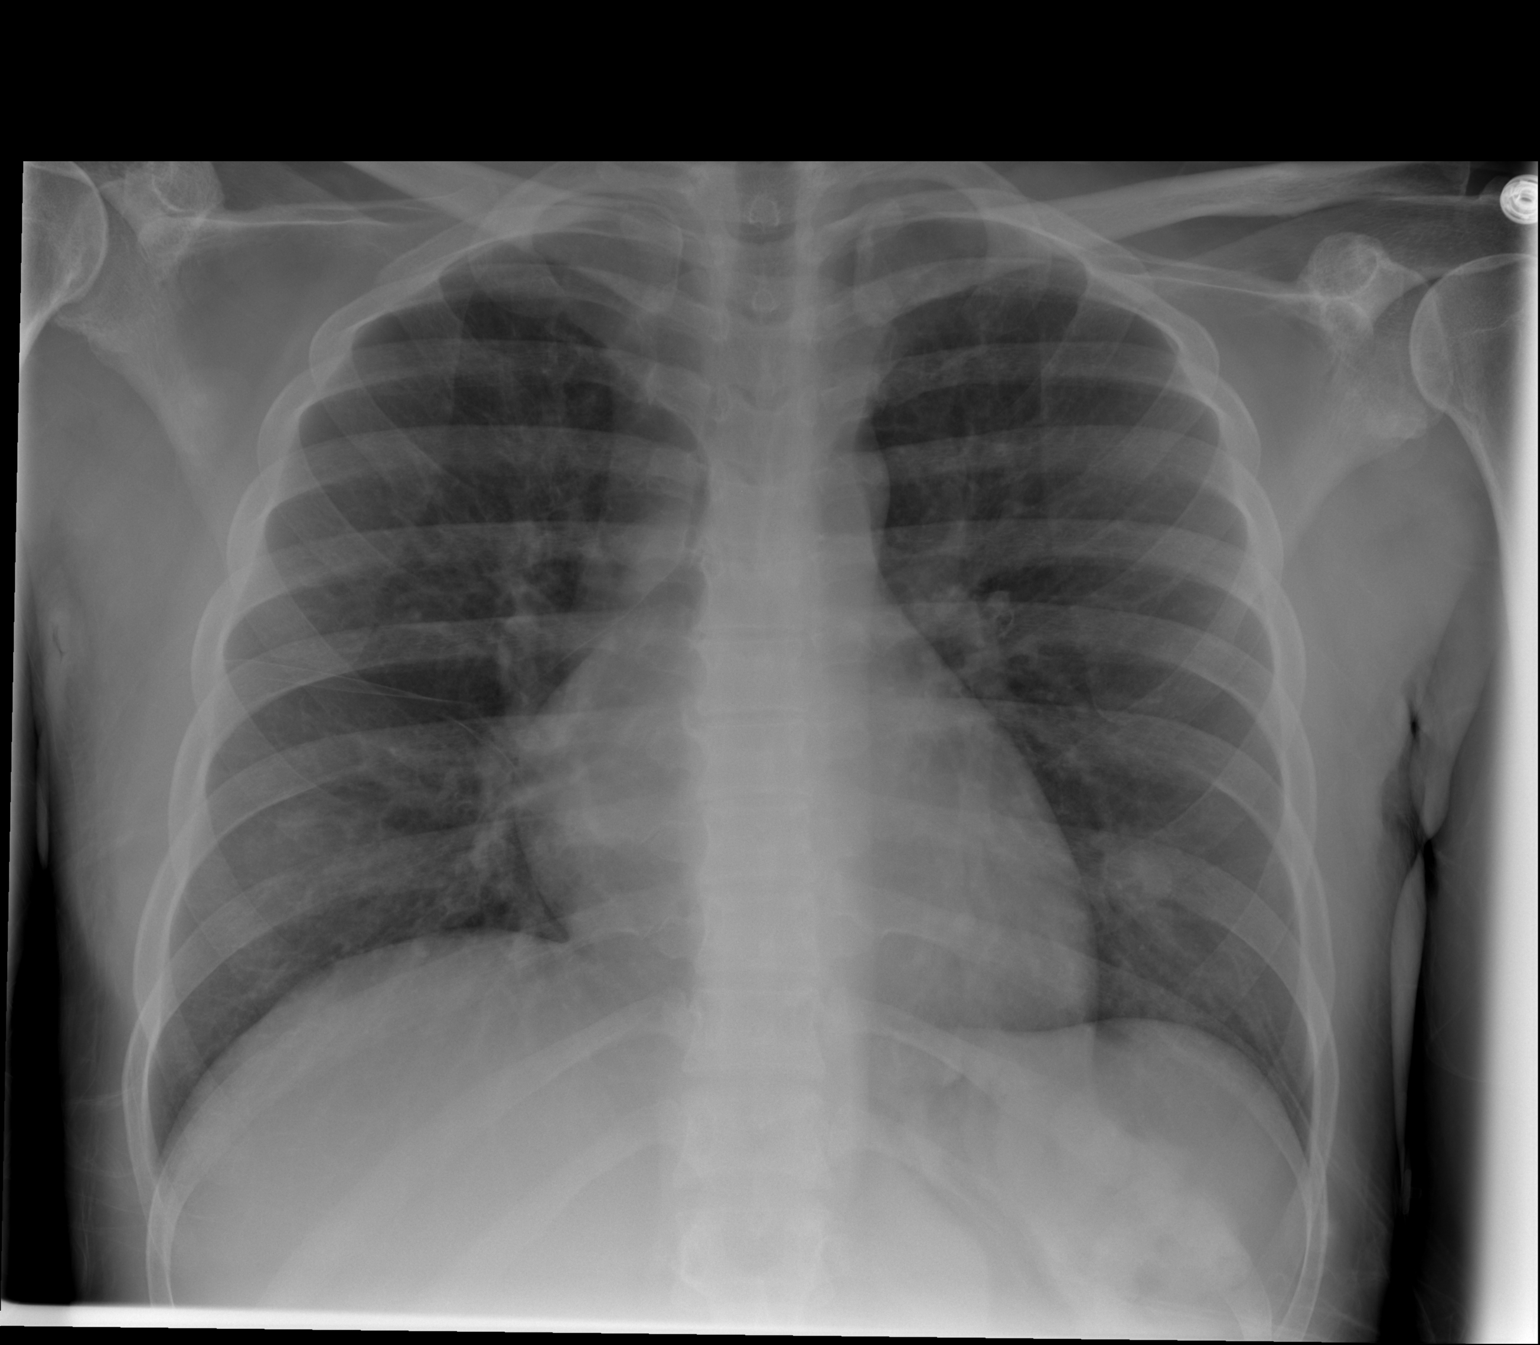

[w chest lat]
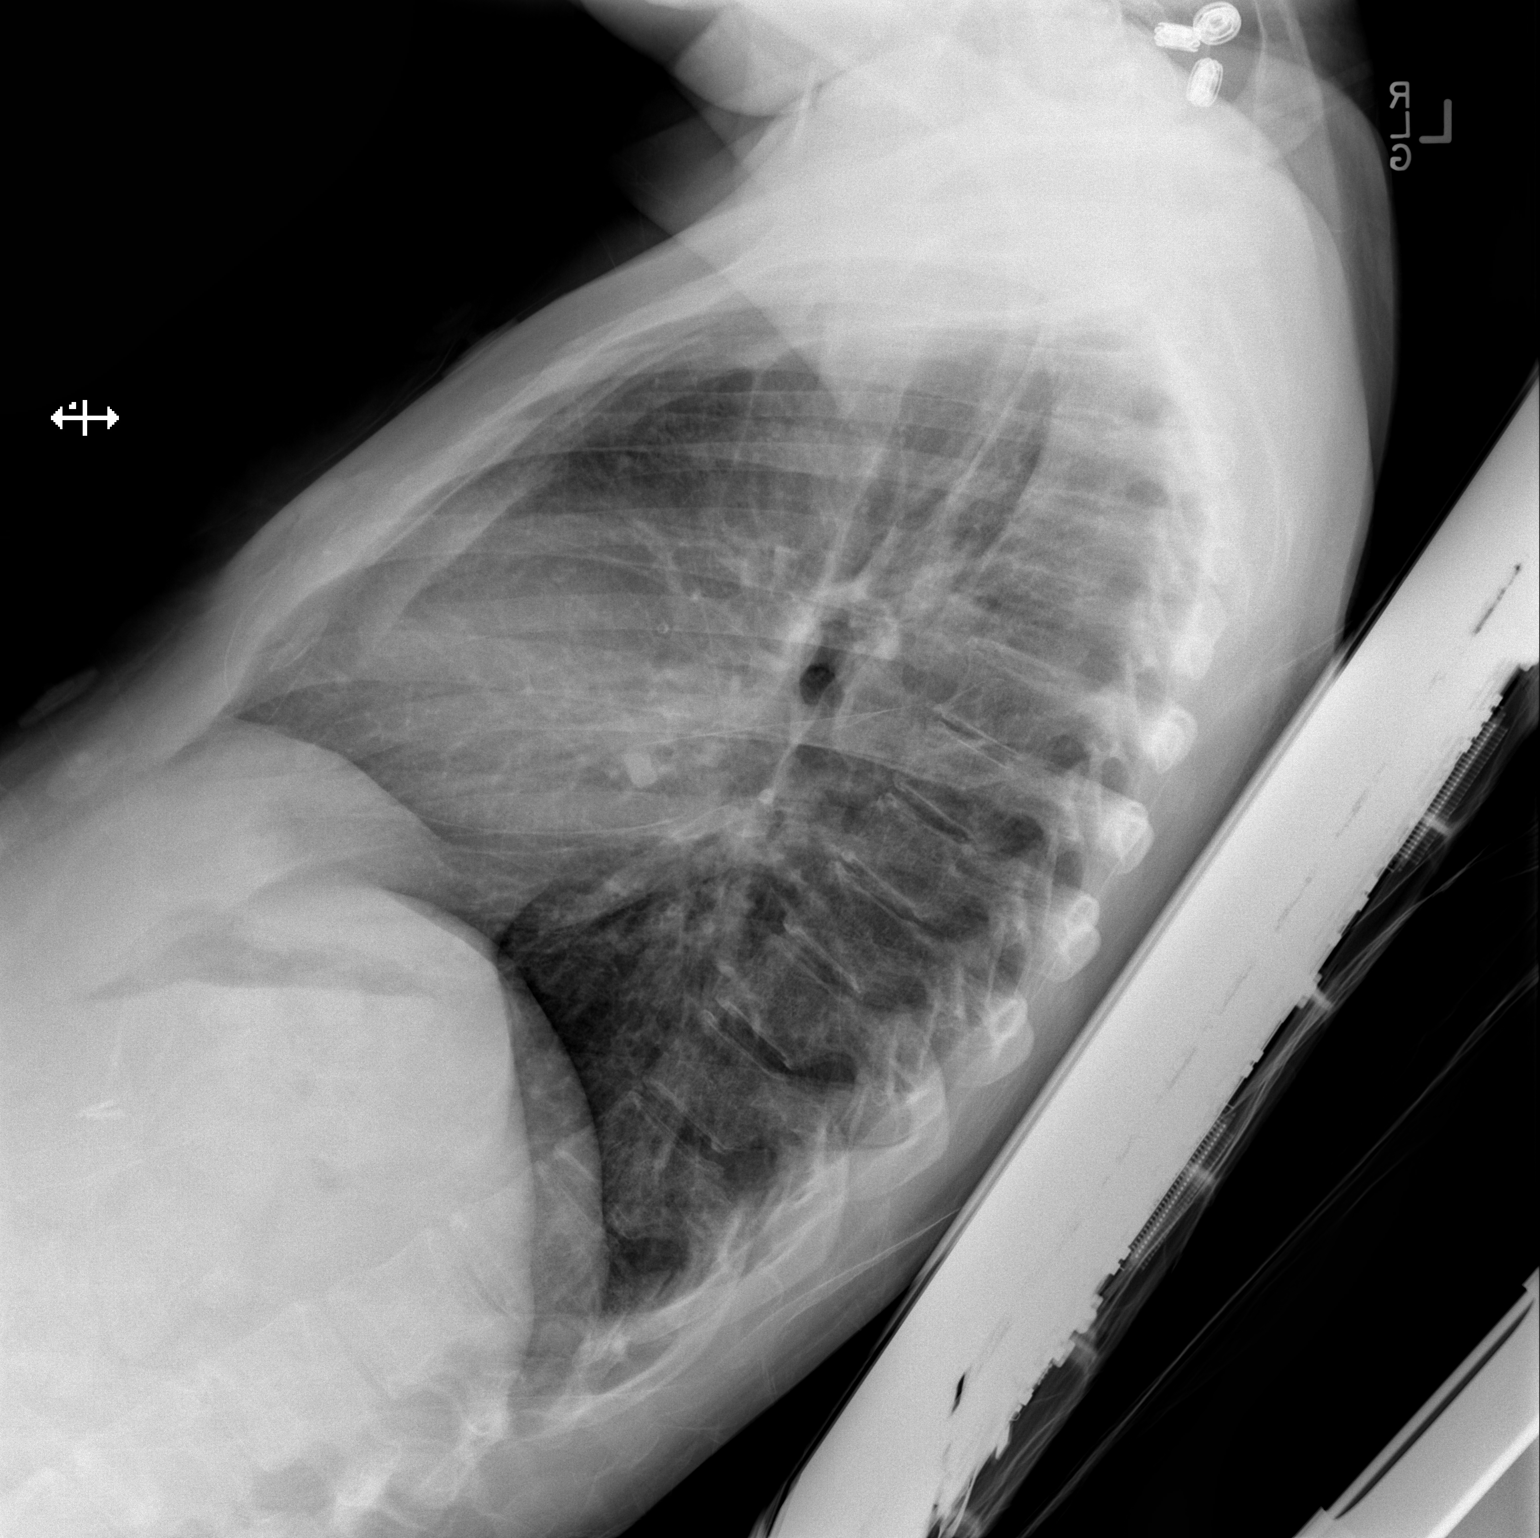

[2 of 2 positions shown; findings below may reference images not displayed]

FINDINGS: The heart size and mediastinal contours are within normal limits.
Both lungs are clear. The visualized skeletal structures are
unremarkable.
IMPRESSION: No active cardiopulmonary disease.

## 2016-03-08 IMAGING — XA IR FLUORO GUIDE CV LINE*R*
1 series · 1 of 1 positions shown · non-contrast
Comparison: none

CLINICAL DATA: Diabetic ketoacidosis, needs venous access for IV
therapies

EXAM:
PICC PLACEMENT WITH ULTRASOUND AND FLUOROSCOPY
FLUOROSCOPY TIME:  0.1 minute, 11 uMymF DAP
TECHNIQUE: After written informed consent was obtained, patient was placed in
the supine position on angiographic table. Patency of the right
basilic vein was confirmed with ultrasound with image documentation.
An appropriate skin site was determined. Skin site was marked.
Region was prepped using maximum barrier technique including cap and
mask, sterile gown, sterile gloves, large sterile sheet, and
Chlorhexidine as cutaneous antisepsis. The region was infiltrated
locally with 1% lidocaine. Under real-time ultrasound guidance, the
right basilic vein was accessed with a 21 gauge micropuncture
needle; the needle tip within the vein was confirmed with ultrasound
image documentation. Needle exchanged over a 018 guidewire for a
peel-away sheath, through which a 5-French double-lumen power
injectable PICC trimmed to 38cm was advanced, positioned with its
tip near the cavoatrial junction. Spot chest radiograph confirms
appropriate catheter position. Catheter was flushed per protocol and
secured externally. The patient tolerated procedure well.
COMPLICATIONS:
COMPLICATIONS
none

[Series 300: ir fluoro guide cv line right · 1 of 1 slices shown]
[im 1/1]
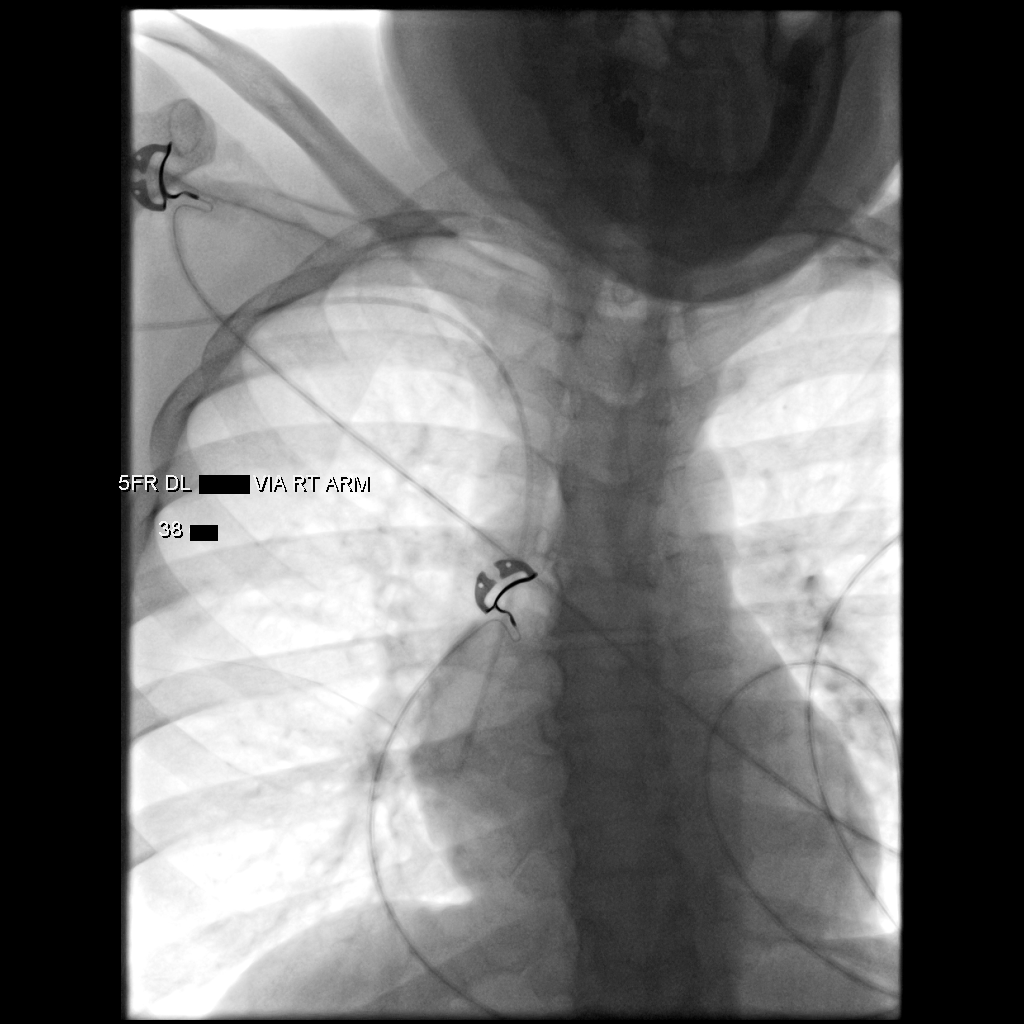

[1 of 1 positions shown; findings below may reference images not displayed]

IMPRESSION: 1. Technically successful five French double lumen power injectable
PICC placement

## 2016-03-08 IMAGING — US IR FLUORO GUIDE CV LINE*R*
1 series · 1 of 1 positions shown · non-contrast
Comparison: none

CLINICAL DATA: Diabetic ketoacidosis, needs venous access for IV
therapies

EXAM:
PICC PLACEMENT WITH ULTRASOUND AND FLUOROSCOPY
FLUOROSCOPY TIME:  0.1 minute, 11 uMymF DAP
TECHNIQUE: After written informed consent was obtained, patient was placed in
the supine position on angiographic table. Patency of the right
basilic vein was confirmed with ultrasound with image documentation.
An appropriate skin site was determined. Skin site was marked.
Region was prepped using maximum barrier technique including cap and
mask, sterile gown, sterile gloves, large sterile sheet, and
Chlorhexidine as cutaneous antisepsis. The region was infiltrated
locally with 1% lidocaine. Under real-time ultrasound guidance, the
right basilic vein was accessed with a 21 gauge micropuncture
needle; the needle tip within the vein was confirmed with ultrasound
image documentation. Needle exchanged over a 018 guidewire for a
peel-away sheath, through which a 5-French double-lumen power
injectable PICC trimmed to 38cm was advanced, positioned with its
tip near the cavoatrial junction. Spot chest radiograph confirms
appropriate catheter position. Catheter was flushed per protocol and
secured externally. The patient tolerated procedure well.
COMPLICATIONS:
COMPLICATIONS
none

[Series 1: ir fluoro/shunt/fist · 1 of 1 slices shown]
[im 1/1]
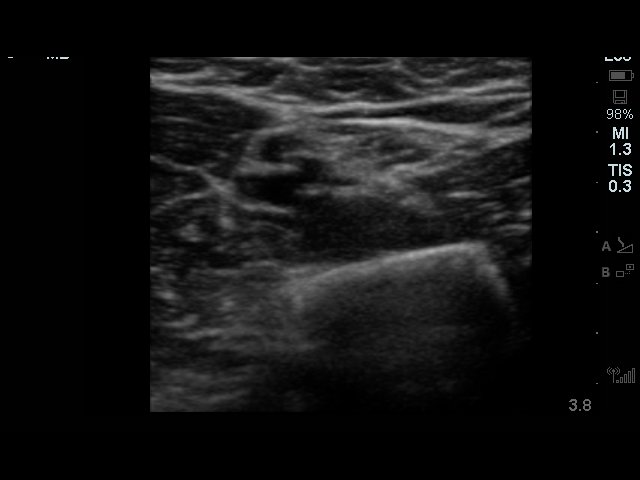

[1 of 1 positions shown; findings below may reference images not displayed]

IMPRESSION: 1. Technically successful five French double lumen power injectable
PICC placement

## 2016-03-11 IMAGING — XA DG FLUORO GUIDE CV LINE
1 series · 1 of 1 positions shown · non-contrast
Comparison: none

ADDENDUM:
Nesacaine was used as a local anesthetic rather than lidocaine due
to an allergy.
CLINICAL DATA: 49-year-old with gastroparesis, persistent retching
and poor venous access.

[Series 300: ir fluoro guide cv midline picc right · 1 of 1 slices shown]
[im 1/1]
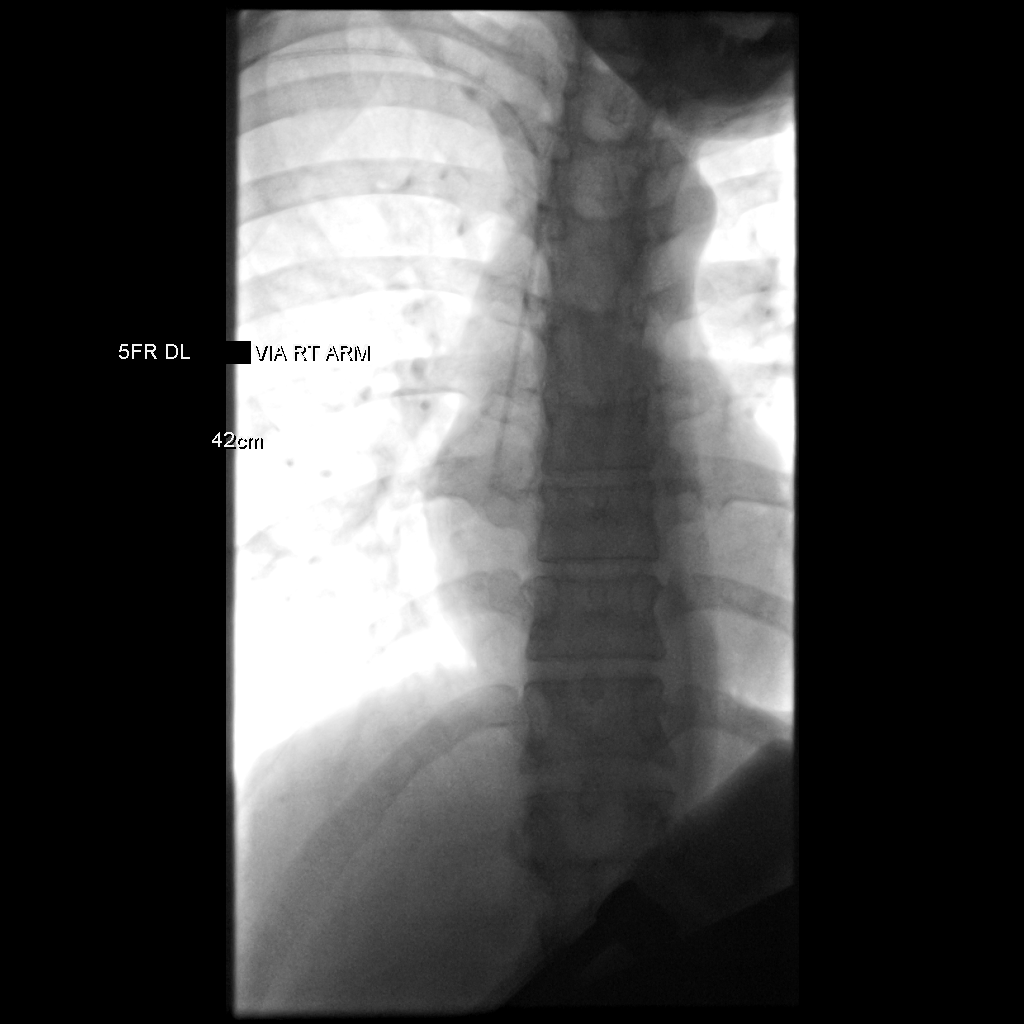

[1 of 1 positions shown; findings below may reference images not displayed]

EXAM:
POWER PICC LINE PLACEMENT WITH ULTRASOUND AND FLUOROSCOPIC GUIDANCE

FLUOROSCOPY TIME:  12 seconds, 2 mGy

PROCEDURE:
The patient was advised of the possible risks and complications and
agreed to undergo the procedure. The patient was then brought to the
angiographic suite for the procedure.

The right arm was prepped with chlorhexidine, draped in the usual
sterile fashion using maximum barrier technique (cap and mask,
sterile gown, sterile gloves, large sterile sheet, hand hygiene and
cutaneous antisepsis) and infiltrated locally with 1% Lidocaine.

Ultrasound demonstrated patency of the right brachial vein, and this
was documented with an image. Under real-time ultrasound guidance,
this vein was accessed with a 21 gauge micropuncture needle and
image documentation was performed. A [DATE] wire was introduced in to
the vein. Over this, a 5 French dual lumen power injectable PICC was
advanced to the lower SVC/right atrial junction. Fluoroscopy during
the procedure and fluoro spot radiograph confirms appropriate
catheter position. The catheter was flushed and covered with a
sterile dressing.

Catheter length: 42 cm

Complications: None

Estimated blood loss: Minimal
IMPRESSION: Successful right arm Power PICC line placement with ultrasound and
fluoroscopic guidance. The catheter is ready for use.

## 2016-03-13 ENCOUNTER — Ambulatory Visit: Payer: Self-pay | Attending: Family Medicine | Admitting: Family Medicine

## 2016-03-13 ENCOUNTER — Encounter: Payer: Self-pay | Admitting: Family Medicine

## 2016-03-13 VITALS — BP 96/59 | HR 92 | Temp 98.1°F | Resp 17 | Ht 62.0 in | Wt 143.0 lb

## 2016-03-13 DIAGNOSIS — Z794 Long term (current) use of insulin: Secondary | ICD-10-CM

## 2016-03-13 DIAGNOSIS — E0821 Diabetes mellitus due to underlying condition with diabetic nephropathy: Secondary | ICD-10-CM

## 2016-03-13 DIAGNOSIS — F32A Depression, unspecified: Secondary | ICD-10-CM

## 2016-03-13 DIAGNOSIS — IMO0002 Reserved for concepts with insufficient information to code with codable children: Secondary | ICD-10-CM

## 2016-03-13 DIAGNOSIS — E1143 Type 2 diabetes mellitus with diabetic autonomic (poly)neuropathy: Secondary | ICD-10-CM

## 2016-03-13 DIAGNOSIS — E108 Type 1 diabetes mellitus with unspecified complications: Secondary | ICD-10-CM

## 2016-03-13 DIAGNOSIS — Z9111 Patient's noncompliance with dietary regimen: Secondary | ICD-10-CM

## 2016-03-13 DIAGNOSIS — E0829 Diabetes mellitus due to underlying condition with other diabetic kidney complication: Secondary | ICD-10-CM

## 2016-03-13 DIAGNOSIS — F419 Anxiety disorder, unspecified: Secondary | ICD-10-CM

## 2016-03-13 DIAGNOSIS — Z91199 Patient's noncompliance with other medical treatment and regimen due to unspecified reason: Secondary | ICD-10-CM | POA: Insufficient documentation

## 2016-03-13 DIAGNOSIS — F418 Other specified anxiety disorders: Secondary | ICD-10-CM

## 2016-03-13 DIAGNOSIS — E1065 Type 1 diabetes mellitus with hyperglycemia: Secondary | ICD-10-CM

## 2016-03-13 DIAGNOSIS — K3184 Gastroparesis: Secondary | ICD-10-CM

## 2016-03-13 DIAGNOSIS — F329 Major depressive disorder, single episode, unspecified: Secondary | ICD-10-CM

## 2016-03-13 DIAGNOSIS — E1040 Type 1 diabetes mellitus with diabetic neuropathy, unspecified: Secondary | ICD-10-CM

## 2016-03-13 LAB — GLUCOSE, POCT (MANUAL RESULT ENTRY): POC GLUCOSE: 52 mg/dL — AB (ref 70–99)

## 2016-03-13 MED ORDER — LISINOPRIL 10 MG PO TABS
10.0000 mg | ORAL_TABLET | Freq: Every day | ORAL | 5 refills | Status: DC
Start: 2016-03-13 — End: 2016-03-20

## 2016-03-13 MED ORDER — PREGABALIN 150 MG PO CAPS: 150.0000 mg | ORAL_CAPSULE | Freq: Two times a day (BID) | ORAL | 3 refills | Status: DC

## 2016-03-13 MED ORDER — METOCLOPRAMIDE HCL 5 MG/5ML PO SOLN: 5.0000 mg | Freq: Three times a day (TID) | ORAL | 1 refills | Status: DC

## 2016-03-13 MED ORDER — GLUCOSE BLOOD VI STRP: ORAL_STRIP | 5 refills | Status: DC

## 2016-03-13 MED ORDER — POLYETHYLENE GLYCOL 3350 17 GM/SCOOP PO POWD: 1.0000 | Freq: Every day | ORAL | 1 refills | Status: DC | PRN

## 2016-03-13 MED FILL — POLYETHYLENE GLYCOL 3350 PO: 15 days supply | Qty: 255 | Fill #0

## 2016-03-13 MED FILL — TRUE METRIX TEST STRIP: 33 days supply | Qty: 100 | Fill #0

## 2016-03-13 MED FILL — METOCLOPRAMIDE 5 MG/5 ML SY: 5 | 24 days supply | Qty: 473 | Fill #0

## 2016-03-13 MED FILL — LISINOPRIL 10 MG TABLET: 10 | 30 days supply | Qty: 30 | Fill #0

## 2016-03-13 NOTE — Patient Instructions (Signed)
Diabetes Mellitus and Food It is important for you to manage your blood sugar (glucose) level. Your blood glucose level can be greatly affected by what you eat. Eating healthier foods in the appropriate amounts throughout the day at about the same time each day will help you control your blood glucose level. It can also help slow or prevent worsening of your diabetes mellitus. Healthy eating may even help you improve the level of your blood pressure and reach or maintain a healthy weight.  General recommendations for healthful eating and cooking habits include:  Eating meals and snacks regularly. Avoid going long periods of time without eating to lose weight.  Eating a diet that consists mainly of plant-based foods, such as fruits, vegetables, nuts, legumes, and whole grains.  Using low-heat cooking methods, such as baking, instead of high-heat cooking methods, such as deep frying. Work with your dietitian to make sure you understand how to use the Nutrition Facts information on food labels. HOW CAN FOOD AFFECT ME? Carbohydrates Carbohydrates affect your blood glucose level more than any other type of food. Your dietitian will help you determine how many carbohydrates to eat at each meal and teach you how to count carbohydrates. Counting carbohydrates is important to keep your blood glucose at a healthy level, especially if you are using insulin or taking certain medicines for diabetes mellitus. Alcohol Alcohol can cause sudden decreases in blood glucose (hypoglycemia), especially if you use insulin or take certain medicines for diabetes mellitus. Hypoglycemia can be a life-threatening condition. Symptoms of hypoglycemia (sleepiness, dizziness, and disorientation) are similar to symptoms of having too much alcohol.  If your health care provider has given you approval to drink alcohol, do so in moderation and use the following guidelines:  Women should not have more than one drink per day, and men  should not have more than two drinks per day. One drink is equal to:  12 oz of beer.  5 oz of wine.  1 oz of hard liquor.  Do not drink on an empty stomach.  Keep yourself hydrated. Have water, diet soda, or unsweetened iced tea.  Regular soda, juice, and other mixers might contain a lot of carbohydrates and should be counted. WHAT FOODS ARE NOT RECOMMENDED? As you make food choices, it is important to remember that all foods are not the same. Some foods have fewer nutrients per serving than other foods, even though they might have the same number of calories or carbohydrates. It is difficult to get your body what it needs when you eat foods with fewer nutrients. Examples of foods that you should avoid that are high in calories and carbohydrates but low in nutrients include:  Trans fats (most processed foods list trans fats on the Nutrition Facts label).  Regular soda.  Juice.  Candy.  Sweets, such as cake, pie, doughnuts, and cookies.  Fried foods. WHAT FOODS CAN I EAT? Eat nutrient-rich foods, which will nourish your body and keep you healthy. The food you should eat also will depend on several factors, including:  The calories you need.  The medicines you take.  Your weight.  Your blood glucose level.  Your blood pressure level.  Your cholesterol level. You should eat a variety of foods, including:  Protein.  Lean cuts of meat.  Proteins low in saturated fats, such as fish, egg whites, and beans. Avoid processed meats.  Fruits and vegetables.  Fruits and vegetables that may help control blood glucose levels, such as apples, mangoes, and   yams.  Dairy products.  Choose fat-free or low-fat dairy products, such as milk, yogurt, and cheese.  Grains, bread, pasta, and rice.  Choose whole grain products, such as multigrain bread, whole oats, and brown rice. These foods may help control blood pressure.  Fats.  Foods containing healthful fats, such as nuts,  avocado, olive oil, canola oil, and fish. DOES EVERYONE WITH DIABETES MELLITUS HAVE THE SAME MEAL PLAN? Because every person with diabetes mellitus is different, there is not one meal plan that works for everyone. It is very important that you meet with a dietitian who will help you create a meal plan that is just right for you.   This information is not intended to replace advice given to you by your health care provider. Make sure you discuss any questions you have with your health care provider.   Document Released: 03/08/2005 Document Revised: 07/02/2014 Document Reviewed: 05/08/2013 Elsevier Interactive Patient Education 2016 Elsevier Inc.  

## 2016-03-13 NOTE — Progress Notes (Signed)
Patient wants more information on advance directive. Patient wants son in control of health order in case she ever gets too sick.   Medication Refill: Blood glucose metrix strip Lisinopril Reglan ( needs to be liquid) Miralax (powder) Lyrica

## 2016-03-13 NOTE — Progress Notes (Signed)
Subjective:  Patient ID: [name], female    DOB: 1966-04-12  Age: 50 y.o. MRN: [id number]  CC: Hospital follow-up/TCC  HPI [name] is a 50 year old female with a history of uncontrolled type 1 diabetes mellitus, diabetic neuropathy, diabetic gastroparesis, hypertension, anemia, anxiety and depression, multiple hospitalizations for diabetic gastroparesis with recent hospitalization for diabetic ketoacidosis.  She was monitored on stepdown unit with aggressive hydration and insulin drip which was later transitioned to Levemir and aspart. Nausea and vomiting resolved with antiemetic and she was subsequently discharged once her condition stabilized.  She has been taking 20 units of Levemir rather than 30 units which she was discharged on for fear of becoming hypoglycemic. Reports to me that her fasting sugar this morning was 301 and so she took 14 units of NovoLog. CBG performed in the clinic today reveals 25. She is yet to apply further Leith discomfort because she has been unable to get her paperwork together.  Past Medical History:  Diagnosis Date  . Depression   . Diabetes mellitus without complication ([place])   . Essential hypertension   . Gastroparesis   . GERD (gastroesophageal reflux disease)   . HLD (hyperlipidemia)     Past Surgical History:  Procedure Laterality Date  . COLONOSCOPY  09/27/2014   at [facility]  . ESOPHAGOGASTRODUODENOSCOPY  09/27/2014   at [facility], Dr Rolan Lipa. biospy neg for celiac, neg for H pylori.   . EYE SURGERY    . gailstones    . IR GENERIC HISTORICAL  01/24/2016   IR FLUORO GUIDE CV LINE RIGHT 01/24/2016 Darrell K Allred, PA-C [facility]  . IR GENERIC HISTORICAL  01/24/2016   IR US GUIDE VASC ACCESS RIGHT 01/24/2016 Darrell K Allred, PA-C [facility]  . POSTERIOR VITRECTOMY AND MEMBRANE PEEL-LEFT EYE  09/28/2002  . POSTERIOR VITRECTOMY AND MEMBRANE PEEL-RIGHT EYE  03/16/2002  . RETINAL DETACHMENT SURGERY      Allergies  Allergen  Reactions  . Anesthetics, Amide Nausea And Vomiting  . Penicillins Diarrhea and Nausea And Vomiting    Has patient had a PCN reaction causing immediate rash, facial/tongue/throat swelling, SOB or lightheadedness with hypotension: No Has patient had a PCN reaction causing severe rash involving mucus membranes or skin necrosis: No Has patient had a PCN reaction that required hospitalization No Has patient had a PCN reaction occurring within the last 10 years: Yes  If all of the above answers are "NO", then may proceed with Cephalosporin use.   . Buprenorphine Hcl Rash  . Encainide Nausea And Vomiting    Outpatient Medications Prior to Visit  Medication Sig Dispense Refill  . Blood Glucose Monitoring Suppl (TRUE METRIX METER) DEVI 1 each by Does not apply route 3 (three) times daily before meals. 1 Device 0  . busPIRone (BUSPAR) 5 MG tablet Take 1 tablet (5 mg total) by mouth 3 (three) times daily. 90 tablet 3  . furosemide (LASIX) 40 MG tablet Take 40 mg by mouth daily as needed for fluid.     Marland Kitchen insulin aspart (NOVOLOG) 100 UNIT/ML injection Inject 0-15 Units into the skin 3 (three) times daily with meals. Per sliding scale 50 mL 3  . insulin detemir (LEVEMIR) 100 UNIT/ML injection Inject 0.3 mLs (30 Units total) into the skin at bedtime. 10 mL 3  . Insulin Syringe-Needle U-100 (BD INSULIN SYRINGE ULTRAFINE) 31G X 15/64" 0.5 ML MISC 1 each by Does not apply route 4 (four) times daily as needed. 120 each 5  . ondansetron (ZOFRAN  ODT) 4 MG disintegrating tablet Take 1 tablet (4 mg total) by mouth every 8 (eight) hours as needed for nausea. 40 tablet 1  . glucose blood (TRUE METRIX BLOOD GLUCOSE TEST) test strip Use 3 times daily before meals 100 each 5  . lisinopril (PRINIVIL,ZESTRIL) 20 MG tablet Take 10 mg by mouth daily.     . metoCLOPramide (REGLAN) 5 MG/5ML solution Take 5 mLs (5 mg total) by mouth 4 (four) times daily -  before meals and at bedtime. 473 mL 1  . polyethylene glycol powder  (GLYCOLAX/MIRALAX) powder Take 1 Container by mouth daily as needed for moderate constipation. Reported on 07/19/2015    . pregabalin (LYRICA) 150 MG capsule Take 1 capsule (150 mg total) by mouth 2 (two) times daily. 180 capsule 3  . albuterol (PROVENTIL HFA;VENTOLIN HFA) 108 (90 Base) MCG/ACT inhaler Inhale 2 puffs into the lungs every 6 (six) hours as needed for wheezing or shortness of breath. (Patient not taking: Reported on 03/13/2016) 1 Inhaler 0   No facility-administered medications prior to visit.     ROS Review of Systems  Constitutional: Negative for activity change, appetite change and fatigue.  HENT: Negative for congestion, sinus pressure and sore throat.   Eyes: Negative for visual disturbance.  Respiratory: Negative for cough, chest tightness, shortness of breath and wheezing.   Cardiovascular: Negative for chest pain and palpitations.  Gastrointestinal: Positive for abdominal pain. Negative for abdominal distention and constipation.  Endocrine: Negative for polydipsia.  Genitourinary: Negative for dysuria and frequency.  Musculoskeletal: Negative for arthralgias and back pain.  Skin: Negative for rash.  Neurological: Negative for tremors, light-headedness and numbness.  Hematological: Does not bruise/bleed easily.  Psychiatric/Behavioral: Negative for agitation and behavioral problems.    Objective:  BP (!) 96/59 (BP Location: Left Arm, Patient Position: Sitting, Cuff Size: Normal)   Pulse 92   Temp 98.1 F (36.7 C) (Oral)   Resp 17   Ht 5\' 2"  (1.575 m)   Wt 143 lb (64.9 kg)   LMP 02/15/2016   SpO2 100%   BMI 26.16 kg/m   BP/Weight 03/13/2016 02/21/2016 [id number]  Systolic BP 96 [id number] -  Diastolic BP 59 56 -  Wt. (Lbs) 143 - 134.7  BMI 26.16 - 24.64      Physical Exam  Constitutional: She is oriented to person, place, and time. She appears well-developed and well-nourished.  Cardiovascular: Normal rate, normal heart sounds and intact distal pulses.   No  murmur heard. Pulmonary/Chest: Effort normal and breath sounds normal. She has no wheezes. She has no rales. She exhibits no tenderness.  Abdominal: Soft. Bowel sounds are normal. She exhibits no distension and no mass. There is no tenderness.  Musculoskeletal: Normal range of motion.  Neurological: She is alert and oriented to person, place, and time.     Lab Results  Component Value Date   HGBA1C 9.4 (H) 01/19/2016    Assessment & Plan:   1. Diabetes mellitus due to underlying condition with diabetic nephropathy, with long-term current use of insulin (HCC) Uncontrolled with A1c of 9.1 due to noncompliance CBG 52 in the clinic - asymptomatic Patient advised to eat peanut butter crackers  which she has with her and given a couple of water to drink as well. She has been strongly encouraged to take 25 units of Levemir as prescribed and she will continue with her sliding scale which she prefers to do. For blood sugars 75 to 179 give 6 units, 180-224 give 8 units, 225-279 gave  10 units, 280 to 330 give 12 units, 301-400 give 14 units, greater than 400 give 15 units I have explained to her that she is at high risk of hypoglycemia with the above named scale but she is adamant would like to continue with it - Glucose (CBG)  2. Diabetic neuropathy, type I diabetes mellitus ([place]) Controlled on Lyrica - pregabalin (LYRICA) 150 MG capsule; Take 1 capsule (150 mg total) by mouth 2 (two) times daily.  Dispense: 60 capsule; Refill: 3  3. Type I diabetes mellitus with complication, uncontrolled (HCC) - glucose blood (TRUE METRIX BLOOD GLUCOSE TEST) test strip; Use 3 times daily before meals  Dispense: 100 each; Refill: 5  4. Diabetic gastroparesis (HCC) Continue Zofran Eat small frequent portion  5. Anxiety and depression Continue BuSpar  6. Noncompliance with treatment plan I have strongly encouraged compliance with regimen; discussed complications of noncompliance She promises to do  better   Meds ordered this encounter  Medications  . pregabalin (LYRICA) 150 MG capsule    Sig: Take 1 capsule (150 mg total) by mouth 2 (two) times daily.    Dispense:  60 capsule    Refill:  3  . lisinopril (PRINIVIL,ZESTRIL) 10 MG tablet    Sig: Take 1 tablet (10 mg total) by mouth daily.    Dispense:  30 tablet    Refill:  5  . metoCLOPramide (REGLAN) 5 MG/5ML solution    Sig: Take 5 mLs (5 mg total) by mouth 4 (four) times daily -  before meals and at bedtime.    Dispense:  473 mL    Refill:  1  . polyethylene glycol powder (GLYCOLAX/MIRALAX) powder    Sig: Take 255 g by mouth daily as needed for moderate constipation. Reported on 07/19/2015    Dispense:  255 g    Refill:  1  . glucose blood (TRUE METRIX BLOOD GLUCOSE TEST) test strip    Sig: Use 3 times daily before meals    Dispense:  100 each    Refill:  5    Follow-up: Return in about 2 weeks (around 03/27/2016) for TCC - follow up on Diabetes.   [name] MD

## 2016-03-16 IMAGING — CT CT ABD-PELV W/ CM
2 of 5 series · 17 of 46 positions shown, 19 images · IV contrast (OMNIPAQUE)
Comparison: 05/14/2015

CLINICAL DATA: Abdominal pain.  Type 1 diabetes and gastroparesis.

EXAM:
CT ABDOMEN AND PELVIS WITH CONTRAST
TECHNIQUE: Multidetector CT imaging of the abdomen and pelvis was performed
using the standard protocol following bolus administration of
intravenous contrast.
CONTRAST:  100mL OMNIPAQUE IOHEXOL 300 MG/ML  SOLN

[Series 2: rtn a/p with · axial · 0.70mm/px · z∈[-404,-34]mm · 14 of 84 slices shown, 16 images]
[im 5/84  soft-tissue]
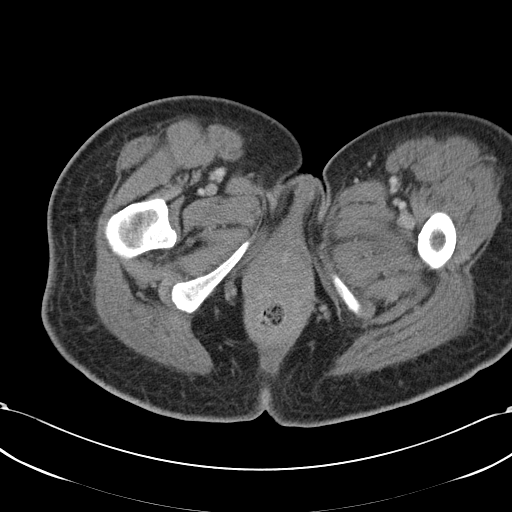
[im 5/84  bone]
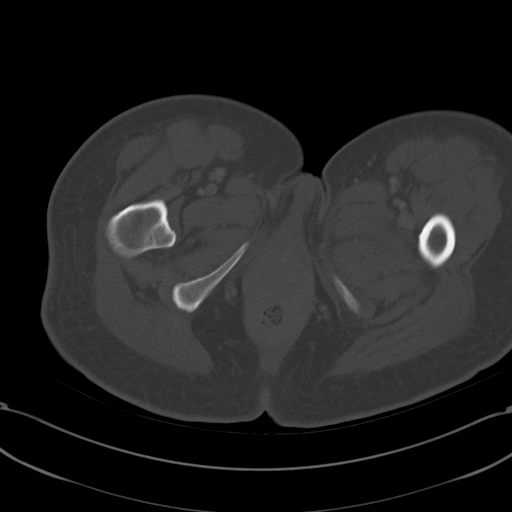
[im 10/84  soft-tissue]
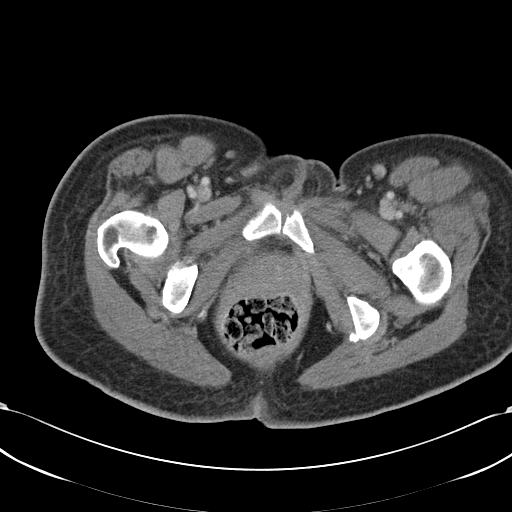
[im 19/84  soft-tissue]
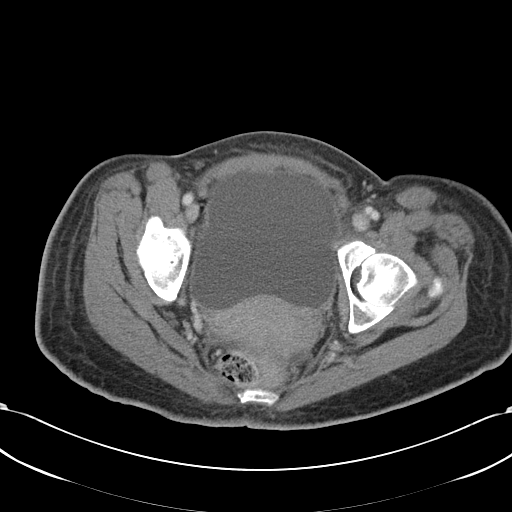
[im 24/84  soft-tissue]
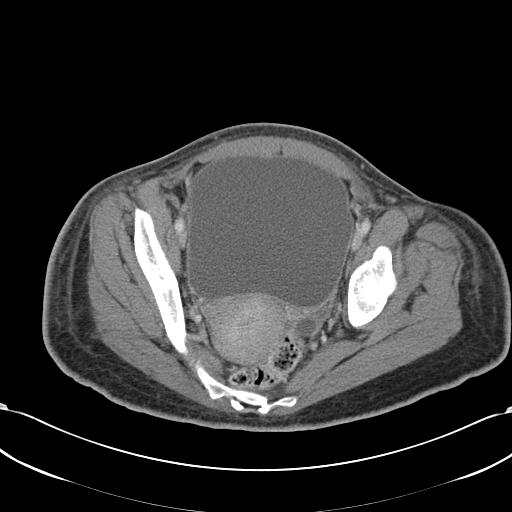
[im 28/84  soft-tissue]
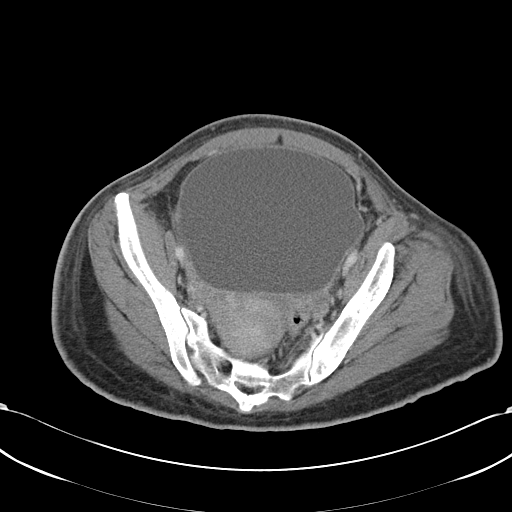
[im 33/84  soft-tissue]
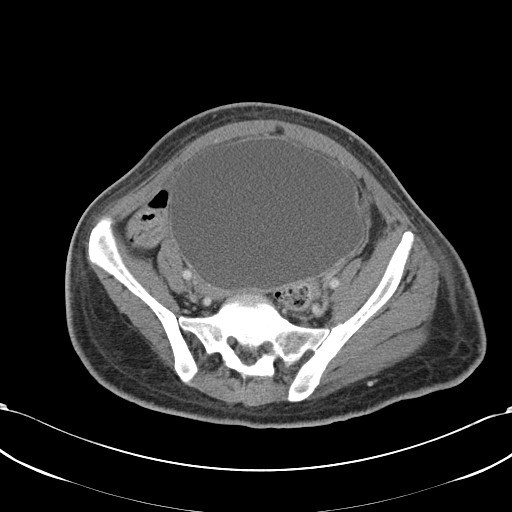
[im 37/84  soft-tissue]
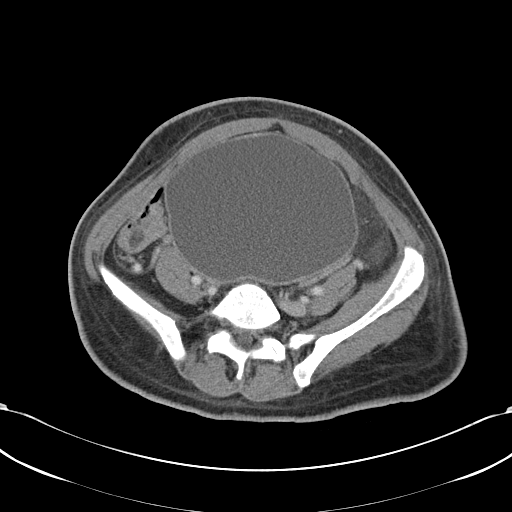
[im 47/84  soft-tissue]
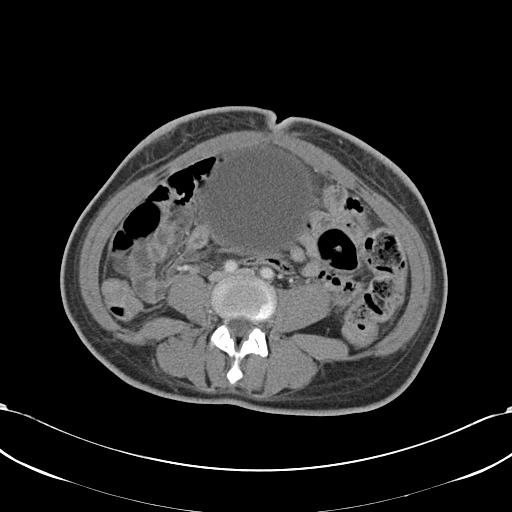
[im 51/84  soft-tissue]
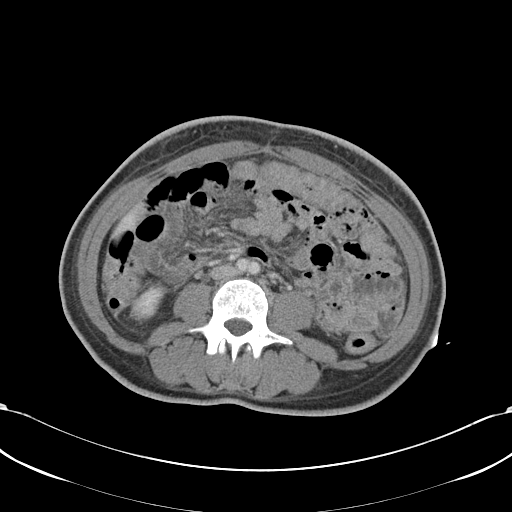
[im 51/84  bone]
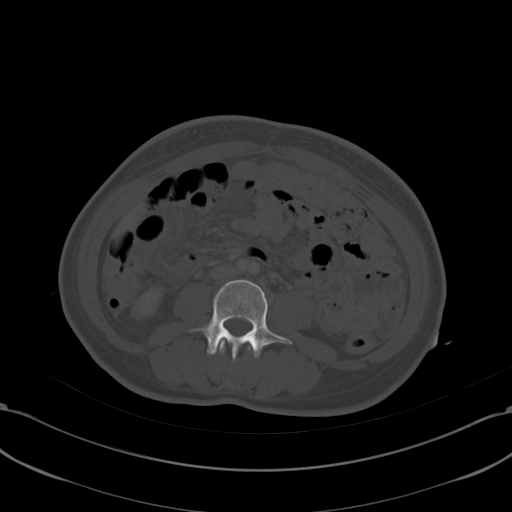
[im 56/84  soft-tissue]
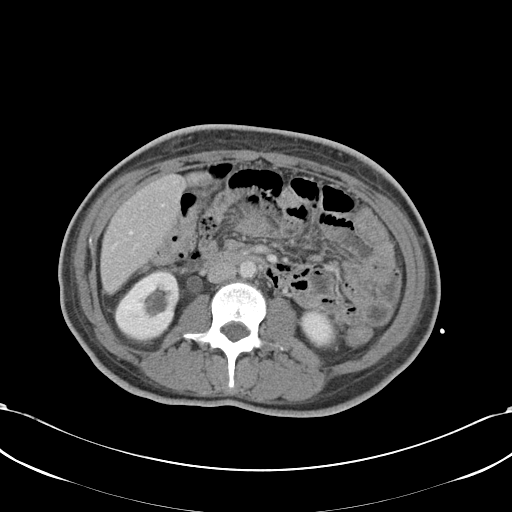
[im 60/84  soft-tissue]
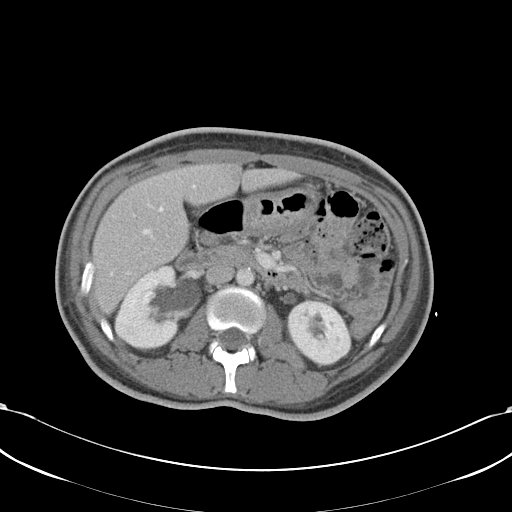
[im 65/84  soft-tissue]
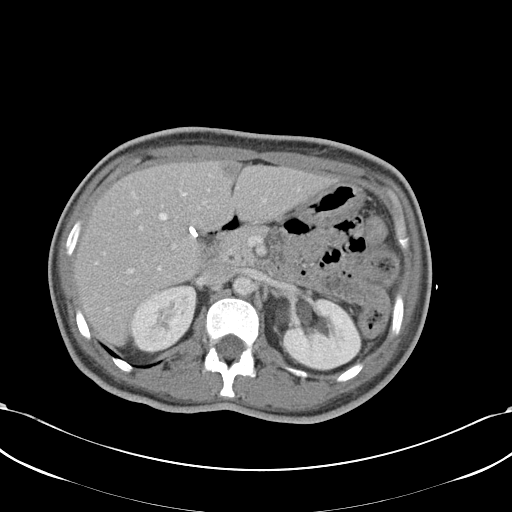
[im 74/84  soft-tissue]
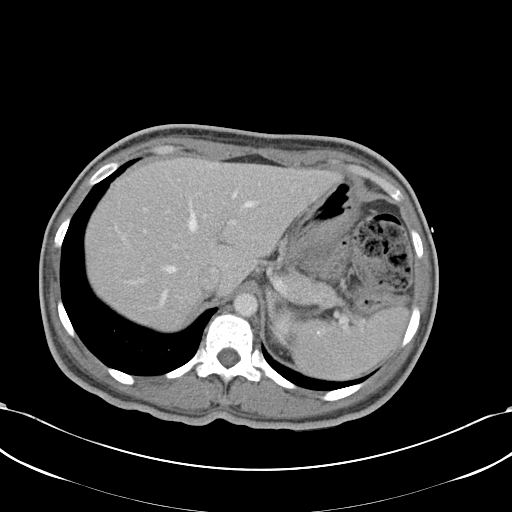
[im 79/84  soft-tissue]
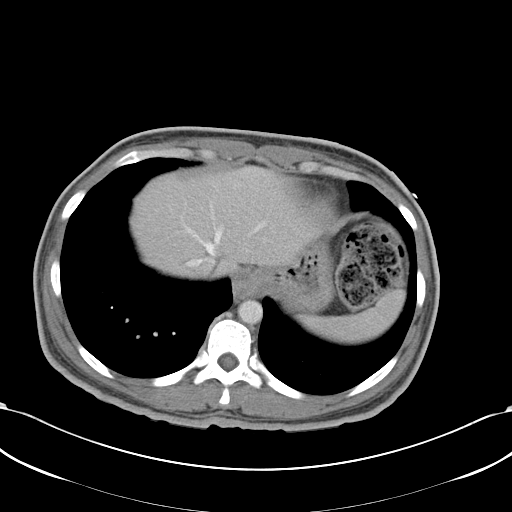

[Series 602: <mpr thick range> · coronal · 0.82mm/px · 3 of 89 slices shown]
[im 30/89  soft-tissue]
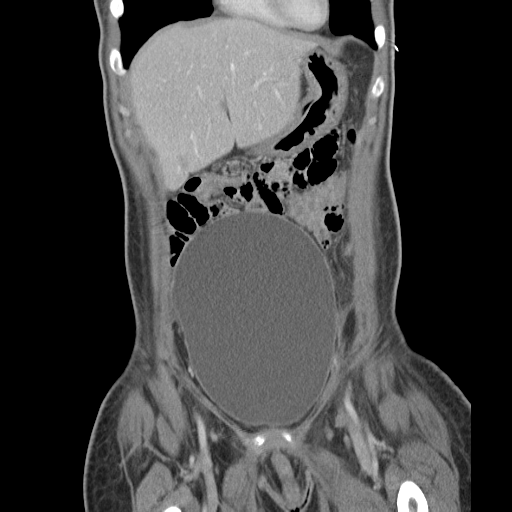
[im 40/89  soft-tissue]
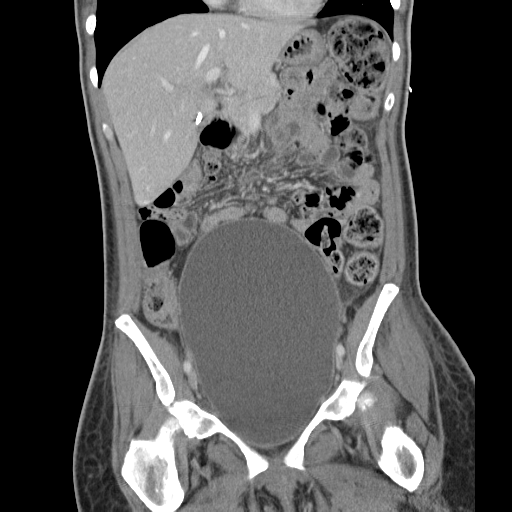
[im 49/89  soft-tissue]
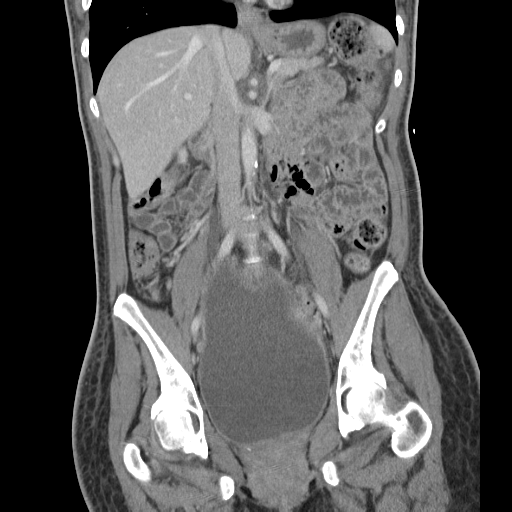

[17 of 46 positions shown; findings below may reference images not displayed]

FINDINGS: Lower chest and abdominal wall:  Anasarca.

Chronic lower esophageal thickening, with small sliding hiatal
hernia better seen on the previous study.

Hepatobiliary: Probable steatosis.Cholecystectomy. Normal common
bile duct diameter.

Pancreas: Unremarkable.

Spleen: Unremarkable.

Adrenals/Urinary Tract: Negative adrenals. Moderate bilateral
hydroureteronephrosis which is greater than previously seen.
Hydronephrosis is secondary to chronic marked over distension of the
urinary bladder, extending to the umbilicus. The wall is chronically
circumferentially thickened without acute surrounding inflammatory
change. Small volume gas in the bladder which is nonspecific.
Anticipate drainage and urinalysis.

Reproductive:Small enhancing uterine masses, up to 21 mm, consistent
with fibroids. Negative adnexa.

Stomach/Bowel: No obstruction. No inflammatory change. No gastric
dilatation.

Vascular/Lymphatic: No acute vascular abnormality. No mass or
adenopathy.

Peritoneal: No ascites or pneumoperitoneum.

Musculoskeletal: No acute abnormalities.
IMPRESSION: 1. Chronically over distended bladder with progressive bilateral
hydroureteronephrosis.
2. Chronic findings are stable and described above.

## 2016-03-17 ENCOUNTER — Encounter (HOSPITAL_COMMUNITY): Payer: Self-pay | Admitting: Emergency Medicine

## 2016-03-17 ENCOUNTER — Emergency Department (HOSPITAL_COMMUNITY)
Admission: EM | Admit: 2016-03-17 | Discharge: 2016-03-17 | Disposition: A | Discharge status: Home / Self Care | Payer: Self-pay | Attending: Emergency Medicine | Admitting: Emergency Medicine

## 2016-03-17 DIAGNOSIS — R1084 Generalized abdominal pain: Secondary | ICD-10-CM | POA: Insufficient documentation

## 2016-03-17 DIAGNOSIS — R112 Nausea with vomiting, unspecified: Secondary | ICD-10-CM | POA: Insufficient documentation

## 2016-03-17 DIAGNOSIS — I10 Essential (primary) hypertension: Secondary | ICD-10-CM | POA: Insufficient documentation

## 2016-03-17 DIAGNOSIS — Z79899 Other long term (current) drug therapy: Secondary | ICD-10-CM | POA: Insufficient documentation

## 2016-03-17 DIAGNOSIS — R109 Unspecified abdominal pain: Secondary | ICD-10-CM

## 2016-03-17 DIAGNOSIS — E1040 Type 1 diabetes mellitus with diabetic neuropathy, unspecified: Secondary | ICD-10-CM | POA: Insufficient documentation

## 2016-03-17 DIAGNOSIS — R110 Nausea: Secondary | ICD-10-CM

## 2016-03-17 LAB — CBC WITH DIFFERENTIAL/PLATELET
Basophils Absolute: 0 10*3/uL (ref 0.0–0.1)
Basophils Relative: 1 %
Eosinophils Absolute: 0.1 10*3/uL (ref 0.0–0.7)
Eosinophils Relative: 2 %
HCT: 36.3 % (ref 36.0–46.0)
Hemoglobin: 10.9 g/dL — ABNORMAL LOW (ref 12.0–15.0)
Lymphocytes Relative: 17 %
Lymphs Abs: 1.1 10*3/uL (ref 0.7–4.0)
MCH: 21.5 pg — ABNORMAL LOW (ref 26.0–34.0)
MCHC: 30 g/dL (ref 30.0–36.0)
MCV: 71.6 fL — ABNORMAL LOW (ref 78.0–100.0)
Monocytes Absolute: 0.6 10*3/uL (ref 0.1–1.0)
Monocytes Relative: 9 %
Neutro Abs: 4.7 10*3/uL (ref 1.7–7.7)
Neutrophils Relative %: 72 %
Platelets: 467 10*3/uL — ABNORMAL HIGH (ref 150–400)
RBC: 5.07 MIL/uL (ref 3.87–5.11)
RDW: 15.8 % — ABNORMAL HIGH (ref 11.5–15.5)
WBC: 6.5 10*3/uL (ref 4.0–10.5)

## 2016-03-17 LAB — COMPREHENSIVE METABOLIC PANEL
ALT: 16 U/L (ref 14–54)
AST: 30 U/L (ref 15–41)
Albumin: 4.6 g/dL (ref 3.5–5.0)
Alkaline Phosphatase: 98 U/L (ref 38–126)
Anion gap: 12 (ref 5–15)
BUN: 18 mg/dL (ref 6–20)
CO2: 21 mmol/L — ABNORMAL LOW (ref 22–32)
Calcium: 10.3 mg/dL (ref 8.9–10.3)
Chloride: 102 mmol/L (ref 101–111)
Creatinine, Ser: 1.03 mg/dL — ABNORMAL HIGH (ref 0.44–1.00)
GFR calc Af Amer: >60 mL/min (ref >60)
GFR calc non Af Amer: >60 mL/min (ref >60)
Glucose, Bld: 212 mg/dL — ABNORMAL HIGH (ref 65–99)
Potassium: 5 mmol/L (ref 3.5–5.1)
Sodium: 135 mmol/L (ref 135–145)
Total Bilirubin: 0.8 mg/dL (ref 0.3–1.2)
Total Protein: 9.1 g/dL — ABNORMAL HIGH (ref 6.5–8.1)

## 2016-03-17 LAB — LIPASE, BLOOD: Lipase: 45 U/L (ref 11–51)

## 2016-03-17 LAB — CBG MONITORING, ED: Glucose-Capillary: 344 mg/dL — ABNORMAL HIGH (ref 65–99)

## 2016-03-17 MED ORDER — ONDANSETRON HCL 4 MG/2ML IJ SOLN
4.0000 mg | Freq: Once | INTRAMUSCULAR | Status: AC
  Administered 2016-03-17: 4 mg via INTRAVENOUS
  Filled 2016-03-17: qty 2

## 2016-03-17 MED ORDER — HALOPERIDOL LACTATE 5 MG/ML IJ SOLN
5.0000 mg | Freq: Once | INTRAMUSCULAR | Status: AC
  Administered 2016-03-17: 5 mg via INTRAVENOUS
  Filled 2016-03-17: qty 1

## 2016-03-17 MED ORDER — SODIUM CHLORIDE 0.9 % IV BOLUS (SEPSIS)
1000.0000 mL | Freq: Once | INTRAVENOUS | Status: AC
  Administered 2016-03-17: 1000 mL via INTRAVENOUS

## 2016-03-17 MED ORDER — METOCLOPRAMIDE HCL 5 MG/ML IJ SOLN
10.0000 mg | Freq: Once | INTRAMUSCULAR | Status: AC
  Administered 2016-03-17: 10 mg via INTRAVENOUS
  Filled 2016-03-17: qty 2

## 2016-03-17 MED ORDER — HYDROMORPHONE HCL 1 MG/ML IJ SOLN
1.0000 mg | Freq: Once | INTRAMUSCULAR | Status: AC
  Administered 2016-03-17: 1 mg via INTRAVENOUS
  Filled 2016-03-17: qty 1

## 2016-03-17 NOTE — ED Provider Notes (Signed)
[facility] DEPT Provider Note   CSN: [id number] Arrival date & time: 03/17/16  1204  By signing my name below, I, [name], attest that this documentation has been prepared under the direction and in the presence of [name], MD. Electronically Signed: Reola [name], ED Scribe. 03/17/16. 12:25 PM.  History   Chief Complaint Chief Complaint  Patient presents with  . Emesis  . Nausea   The history is provided by the patient and medical records. No language interpreter was used.   HPI Comments: [name] is a 50 y.o. female BIB EMS, with a PMHx of DM1 and Gastroperesis, who presents to the Emergency Department complaining of intermittent episodes of nausea and vomiting x ~3 hours. She notes associated generalized weakness, diffuse abdominal pain, diaphoresis, and chills secondary to these epiosdes. Pt states that she was mildly fatigued last night prior to the onset of her symptoms, but was feeling at baseline otherwise. Pt reports that she has a hx of Gastroparesis, and her pain and symptoms today are similar. She has been taking oral Zofran prior to coming into the ED with minimal relief. Pt notes that she has felt moderately anxious due to her symptoms, which she states has exacerbated her episodes of emesis. Her symptoms are also exacerbated with PO intake. Pt has been admitted in the past with DKA with symptoms and presentation of same. No sick contact with similar symptoms. Denies diarrhea, hematemesis, dysuria, hematuria, difficulty urinating, urgency, frequency, or any other associated symptoms.   Past Medical History:  Diagnosis Date  . Depression   . Diabetes mellitus without complication ([place])   . Essential hypertension   . Gastroparesis   . GERD (gastroesophageal reflux disease)   . HLD (hyperlipidemia)    Patient Active Problem List   Diagnosis Date Noted  . Noncompliance with treatment plan 03/13/2016  . Essential hypertension 01/24/2016  .  Leukocytosis 01/24/2016  . Prolonged QT interval 11/27/2015  . Abnormal ECG 11/27/2015  . HLD (hyperlipidemia)   . GERD (gastroesophageal reflux disease)   . Metabolic acidosis, normal anion gap (NAG)   . AKI (acute kidney injury) ([place])   . Anemia, iron deficiency   . DKA, type 1, not at goal [facility])   . Hyponatremia 09/02/2015  . Vitamin B12 deficiency 08/16/2015  . Lactic acidosis 07/29/2015  . Acute pyelonephritis 07/29/2015  . Pyelonephritis   . Hypothermia   . Diabetic gastroparesis ([place])   . Diabetic neuropathy, type I diabetes mellitus ([place]) 05/18/2015  . Anemia 05/18/2015  . Anxiety and depression 05/18/2015  . Nausea & vomiting   . Diabetic ketoacidosis without coma associated with type 1 diabetes mellitus ([place])   . High anion gap metabolic acidosis [id number]   Past Surgical History:  Procedure Laterality Date  . COLONOSCOPY  09/27/2014   at [facility]  . ESOPHAGOGASTRODUODENOSCOPY  09/27/2014   at [facility], Dr Rolan Lipa. biospy neg for celiac, neg for H pylori.   . EYE SURGERY    . gailstones    . IR GENERIC HISTORICAL  01/24/2016   IR FLUORO GUIDE CV LINE RIGHT 01/24/2016 Darrell K Allred, PA-C [facility]  . IR GENERIC HISTORICAL  01/24/2016   IR US GUIDE VASC ACCESS RIGHT 01/24/2016 Darrell K Allred, PA-C [facility]  . POSTERIOR VITRECTOMY AND MEMBRANE PEEL-LEFT EYE  09/28/2002  . POSTERIOR VITRECTOMY AND MEMBRANE PEEL-RIGHT EYE  03/16/2002  . RETINAL DETACHMENT SURGERY     OB History    No data available  Home Medications    Prior to Admission medications   Medication Sig Start Date End Date Taking? Authorizing Provider  albuterol (PROVENTIL HFA;VENTOLIN HFA) 108 (90 Base) MCG/ACT inhaler Inhale 2 puffs into the lungs every 6 (six) hours as needed for wheezing or shortness of breath. Patient not taking: Reported on 03/13/2016 10/20/15   [name], MD  Blood Glucose Monitoring Suppl (TRUE METRIX METER) DEVI 1 each by Does not apply route 3 (three) times daily  before meals. 07/19/15   [name], MD  busPIRone (BUSPAR) 5 MG tablet Take 1 tablet (5 mg total) by mouth 3 (three) times daily. 01/31/16   [name], MD  furosemide (LASIX) 40 MG tablet Take 40 mg by mouth daily as needed for fluid.     Historical Provider, MD  glucose blood (TRUE METRIX BLOOD GLUCOSE TEST) test strip Use 3 times daily before meals 03/13/16   [name], MD  insulin aspart (NOVOLOG) 100 UNIT/ML injection Inject 0-15 Units into the skin 3 (three) times daily with meals. Per sliding scale 01/31/16   [name], MD  insulin detemir (LEVEMIR) 100 UNIT/ML injection Inject 0.3 mLs (30 Units total) into the skin at bedtime. 02/21/16   Nishant Dhungel, MD  Insulin Syringe-Needle U-100 (BD INSULIN SYRINGE ULTRAFINE) 31G X 15/64" 0.5 ML MISC 1 each by Does not apply route 4 (four) times daily as needed. 08/16/15   [name], MD  lisinopril (PRINIVIL,ZESTRIL) 10 MG tablet Take 1 tablet (10 mg total) by mouth daily. 03/13/16   [name], MD  metoCLOPramide (REGLAN) 5 MG/5ML solution Take 5 mLs (5 mg total) by mouth 4 (four) times daily -  before meals and at bedtime. 03/13/16   [name], MD  ondansetron (ZOFRAN ODT) 4 MG disintegrating tablet Take 1 tablet (4 mg total) by mouth every 8 (eight) hours as needed for nausea. 01/31/16   [name], MD  polyethylene glycol powder (GLYCOLAX/MIRALAX) powder Take 255 g by mouth daily as needed for moderate constipation. Reported on 07/19/2015 03/13/16   [name], MD  pregabalin (LYRICA) 150 MG capsule Take 1 capsule (150 mg total) by mouth 2 (two) times daily. 03/13/16   [name], MD   Family History Family History  Problem Relation Age of Onset  . Cystic fibrosis Mother   . Hypertension Father   . Diabetes Brother   . Hypertension Maternal Grandmother    Social History Social History  Substance Use Topics  . Smoking status: Never Smoker  . Smokeless tobacco: Never Used  . Alcohol use No   Allergies   Anesthetics, amide;  Penicillins; Buprenorphine hcl; and Encainide  Review of Systems Review of Systems  Constitutional: Positive for chills and diaphoresis.  Gastrointestinal: Positive for abdominal pain (diffuse), nausea and vomiting. Negative for diarrhea.       Negative for hematemesis.  Genitourinary: Negative for difficulty urinating, dysuria, frequency, hematuria and urgency.  Neurological: Positive for weakness (generalized).  Psychiatric/Behavioral: The patient is nervous/anxious.   All other systems reviewed and are negative.  Physical Exam Updated Vital Signs BP 150/94 (BP Location: Right Arm)   Pulse 111   Temp 98.4 F (36.9 C) (Oral)   Resp 18   Ht 5\' 2"  (1.575 m)   Wt 144 lb (65.3 kg)   LMP 02/15/2016   SpO2 98%   BMI 26.34 kg/m   Physical Exam  Constitutional: She appears well-developed and well-nourished.  Appears uncomfortable on exam. Rocking back and forth in bed.  HENT:  Head: Normocephalic and  atraumatic.  Mouth/Throat: Oropharynx is clear and moist. No oropharyngeal exudate.  Eyes: Conjunctivae and EOM are normal. Pupils are equal, round, and reactive to light. Right eye exhibits no discharge. Left eye exhibits no discharge. No scleral icterus.  Neck: Normal range of motion. Neck supple. No JVD present. No thyromegaly present.  Cardiovascular: Regular rhythm, normal heart sounds and intact distal pulses.  Tachycardia present.  Exam reveals no gallop and no friction rub.   No murmur heard. Mildly tachycardic.  Pulmonary/Chest: Effort normal and breath sounds normal. No respiratory distress. She has no wheezes. She has no rales.  Abdominal: Soft. Bowel sounds are normal. She exhibits no distension and no mass. There is tenderness.  Diffuse abdominal tenderness, worse in the epigastrium.   Musculoskeletal: Normal range of motion. She exhibits no edema or tenderness.  Lymphadenopathy:    She has no cervical adenopathy.  Neurological: She is alert. Coordination normal.  Skin:  Skin is warm and dry. No rash noted. No erythema.  Psychiatric: She has a normal mood and affect. Her behavior is normal.  Nursing note and vitals reviewed.  ED Treatments / Results  DIAGNOSTIC STUDIES: Oxygen Saturation is 98% on RA, normal by my interpretation.   COORDINATION OF CARE: 12:25 PM-Discussed next steps with pt. Pt verbalized understanding and is agreeable with the plan.   Labs (all labs ordered are listed, but only abnormal results are displayed) Labs Reviewed  CBC WITH DIFFERENTIAL/PLATELET - Abnormal; Notable for the following:       Result Value   Hemoglobin 10.9 (*)    MCV 71.6 (*)    MCH 21.5 (*)    RDW 15.8 (*)    Platelets 467 (*)    All other components within normal limits  COMPREHENSIVE METABOLIC PANEL - Abnormal; Notable for the following:    CO2 21 (*)    Glucose, Bld 212 (*)    Creatinine, Ser 1.03 (*)    Total Protein 9.1 (*)    All other components within normal limits  CBG MONITORING, ED - Abnormal; Notable for the following:    Glucose-Capillary 344 (*)    All other components within normal limits  LIPASE, BLOOD    EKG  EKG Interpretation  Date/Time:  Saturday March 17 2016 12:34:05 EDT Ventricular Rate:  109 PR Interval:    QRS Duration: 94 QT Interval:  331 QTC Calculation: 446 R Axis:   -8 Text Interpretation:  Sinus tachycardia Confirmed by Wilson Singer  MD, Annie Main [id number]) on 03/17/2016 2:39:39 PM      Radiology No results found.  Procedures Procedures (including critical care time)  Medications Ordered in ED Medications - No data to display  Initial Impression / Assessment and Plan / ED Course  I have reviewed the triage vital signs and the nursing notes.  Pertinent labs & imaging results that were available during my care of the patient were reviewed by me and considered in my medical decision making (see chart for details).  Clinical Course   Final Clinical Impressions(s) / ED Diagnoses   Final diagnoses:  Nausea    Abdominal pain, unspecified abdominal location    New Prescriptions New Prescriptions   No medications on file   I personally preformed the services scribed in my presence. The recorded information has been reviewed is accurate. [name], MD.     [name], MD 03/18/16 [id number]

## 2016-03-17 NOTE — ED Triage Notes (Signed)
Per EMS 50 yo female; son called coming from home hx of gastroparesis vomiting X 3 hours today.  Pt c/o of generalized weakness and abd pain.  Last time was she was seen for this was 1 month ago.    BP 168/80 HR 106 RR 18 DM CBG 256  Vomiting with EMS and while waiting to be placed in the room.  Son will be coming in.  Allergic to PCN

## 2016-03-18 ENCOUNTER — Encounter (HOSPITAL_COMMUNITY): Payer: Self-pay | Admitting: Emergency Medicine

## 2016-03-18 ENCOUNTER — Inpatient Hospital Stay (HOSPITAL_COMMUNITY)
Admission: EM | Admit: 2016-03-18 | Discharge: 2016-03-20 | DRG: 638 | Disposition: A | Discharge status: Home / Self Care | Payer: Self-pay | Attending: Internal Medicine | Admitting: Internal Medicine

## 2016-03-18 DIAGNOSIS — E785 Hyperlipidemia, unspecified: Secondary | ICD-10-CM | POA: Diagnosis present

## 2016-03-18 DIAGNOSIS — R109 Unspecified abdominal pain: Secondary | ICD-10-CM

## 2016-03-18 DIAGNOSIS — Z888 Allergy status to other drugs, medicaments and biological substances status: Secondary | ICD-10-CM

## 2016-03-18 DIAGNOSIS — Z794 Long term (current) use of insulin: Secondary | ICD-10-CM

## 2016-03-18 DIAGNOSIS — E1143 Type 2 diabetes mellitus with diabetic autonomic (poly)neuropathy: Secondary | ICD-10-CM | POA: Diagnosis present

## 2016-03-18 DIAGNOSIS — Z884 Allergy status to anesthetic agent status: Secondary | ICD-10-CM

## 2016-03-18 DIAGNOSIS — K3184 Gastroparesis: Secondary | ICD-10-CM | POA: Diagnosis present

## 2016-03-18 DIAGNOSIS — I10 Essential (primary) hypertension: Secondary | ICD-10-CM | POA: Diagnosis present

## 2016-03-18 DIAGNOSIS — Z9114 Patient's other noncompliance with medication regimen: Secondary | ICD-10-CM

## 2016-03-18 DIAGNOSIS — E1010 Type 1 diabetes mellitus with ketoacidosis without coma: Principal | ICD-10-CM | POA: Diagnosis present

## 2016-03-18 DIAGNOSIS — I129 Hypertensive chronic kidney disease with stage 1 through stage 4 chronic kidney disease, or unspecified chronic kidney disease: Secondary | ICD-10-CM | POA: Diagnosis present

## 2016-03-18 DIAGNOSIS — F32A Depression, unspecified: Secondary | ICD-10-CM | POA: Diagnosis present

## 2016-03-18 DIAGNOSIS — N183 Chronic kidney disease, stage 3 (moderate): Secondary | ICD-10-CM | POA: Diagnosis present

## 2016-03-18 DIAGNOSIS — N179 Acute kidney failure, unspecified: Secondary | ICD-10-CM

## 2016-03-18 DIAGNOSIS — Z885 Allergy status to narcotic agent status: Secondary | ICD-10-CM

## 2016-03-18 DIAGNOSIS — F329 Major depressive disorder, single episode, unspecified: Secondary | ICD-10-CM | POA: Diagnosis present

## 2016-03-18 DIAGNOSIS — E1069 Type 1 diabetes mellitus with other specified complication: Secondary | ICD-10-CM | POA: Diagnosis present

## 2016-03-18 DIAGNOSIS — E860 Dehydration: Secondary | ICD-10-CM | POA: Diagnosis present

## 2016-03-18 DIAGNOSIS — Z79899 Other long term (current) drug therapy: Secondary | ICD-10-CM

## 2016-03-18 DIAGNOSIS — F419 Anxiety disorder, unspecified: Secondary | ICD-10-CM | POA: Diagnosis present

## 2016-03-18 DIAGNOSIS — K219 Gastro-esophageal reflux disease without esophagitis: Secondary | ICD-10-CM | POA: Diagnosis present

## 2016-03-18 DIAGNOSIS — Z880 Allergy status to penicillin: Secondary | ICD-10-CM

## 2016-03-18 DIAGNOSIS — E1022 Type 1 diabetes mellitus with diabetic chronic kidney disease: Secondary | ICD-10-CM | POA: Diagnosis present

## 2016-03-18 DIAGNOSIS — Z833 Family history of diabetes mellitus: Secondary | ICD-10-CM

## 2016-03-18 DIAGNOSIS — Z8249 Family history of ischemic heart disease and other diseases of the circulatory system: Secondary | ICD-10-CM

## 2016-03-18 DIAGNOSIS — E1043 Type 1 diabetes mellitus with diabetic autonomic (poly)neuropathy: Secondary | ICD-10-CM | POA: Diagnosis present

## 2016-03-18 HISTORY — DX: Type 1 diabetes mellitus with ketoacidosis without coma: E10.10

## 2016-03-18 LAB — CBC WITH DIFFERENTIAL/PLATELET
BASOS PCT: 0 %
Basophils Absolute: 0 10*3/uL (ref 0.0–0.1)
EOS ABS: 0 10*3/uL (ref 0.0–0.7)
EOS PCT: 0 %
HCT: 37.2 % (ref 36.0–46.0)
HEMOGLOBIN: 10.8 g/dL — AB (ref 12.0–15.0)
LYMPHS PCT: 5 %
Lymphs Abs: 0.7 10*3/uL (ref 0.7–4.0)
MCH: 21.7 pg — AB (ref 26.0–34.0)
MCHC: 29 g/dL — ABNORMAL LOW (ref 30.0–36.0)
MCV: 74.8 fL — ABNORMAL LOW (ref 78.0–100.0)
Monocytes Absolute: 0.4 10*3/uL (ref 0.1–1.0)
Monocytes Relative: 3 %
NEUTROS PCT: 92 %
Neutro Abs: 12.1 10*3/uL — ABNORMAL HIGH (ref 1.7–7.7)
Platelets: 487 10*3/uL — ABNORMAL HIGH (ref 150–400)
RBC: 4.97 MIL/uL (ref 3.87–5.11)
RDW: 16.3 % — ABNORMAL HIGH (ref 11.5–15.5)
WBC: 13.2 10*3/uL — AB (ref 4.0–10.5)

## 2016-03-18 LAB — BASIC METABOLIC PANEL
ANION GAP: 12 (ref 5–15)
Anion gap: 15 (ref 5–15)
BUN: 17 mg/dL (ref 6–20)
BUN: 20 mg/dL (ref 6–20)
BUN: 21 mg/dL — ABNORMAL HIGH (ref 6–20)
CALCIUM: 10.2 mg/dL (ref 8.9–10.3)
CALCIUM: 9.9 mg/dL (ref 8.9–10.3)
CALCIUM: 9.6 mg/dL (ref 8.9–10.3)
CHLORIDE: 104 mmol/L (ref 101–111)
CHLORIDE: 112 mmol/L — AB (ref 101–111)
CO2: 11 mmol/L — AB (ref 22–32)
CO2: 15 mmol/L — ABNORMAL LOW (ref 22–32)
CO2: 15 mmol/L — ABNORMAL LOW (ref 22–32)
CREATININE: 1.43 mg/dL — AB (ref 0.44–1.00)
Chloride: 115 mmol/L — ABNORMAL HIGH (ref 101–111)
Creatinine, Ser: 1.25 mg/dL — ABNORMAL HIGH (ref 0.44–1.00)
Creatinine, Ser: 1.29 mg/dL — ABNORMAL HIGH (ref 0.44–1.00)
GFR calc non Af Amer: 50 mL/min — ABNORMAL LOW (ref >60)
GFR, EST AFRICAN AMERICAN: 58 mL/min — AB (ref >60)
GFR, EST AFRICAN AMERICAN: 49 mL/min — AB (ref >60)
GFR, EST AFRICAN AMERICAN: 56 mL/min — AB (ref >60)
GFR, EST NON AFRICAN AMERICAN: 43 mL/min — AB (ref >60)
GFR, EST NON AFRICAN AMERICAN: 48 mL/min — AB (ref >60)
GLUCOSE: 145 mg/dL — AB (ref 65–99)
Glucose, Bld: 551 mg/dL (ref 65–99)
Glucose, Bld: 213 mg/dL — ABNORMAL HIGH (ref 65–99)
POTASSIUM: 4.6 mmol/L (ref 3.5–5.1)
Potassium: 5.3 mmol/L — ABNORMAL HIGH (ref 3.5–5.1)
Potassium: 4.9 mmol/L (ref 3.5–5.1)
SODIUM: 138 mmol/L (ref 135–145)
SODIUM: 142 mmol/L (ref 135–145)
SODIUM: 142 mmol/L (ref 135–145)

## 2016-03-18 LAB — GLUCOSE, CAPILLARY
GLUCOSE-CAPILLARY: 145 mg/dL — AB (ref 65–99)
GLUCOSE-CAPILLARY: 146 mg/dL — AB (ref 65–99)
GLUCOSE-CAPILLARY: 181 mg/dL — AB (ref 65–99)
GLUCOSE-CAPILLARY: 233 mg/dL — AB (ref 65–99)
GLUCOSE-CAPILLARY: 316 mg/dL — AB (ref 65–99)

## 2016-03-18 LAB — URINALYSIS, ROUTINE W REFLEX MICROSCOPIC
BILIRUBIN URINE: NEGATIVE
Leukocytes, UA: NEGATIVE
NITRITE: NEGATIVE
PH: 5 (ref 5.0–8.0)
Protein, ur: 100 mg/dL — AB
Specific Gravity, Urine: 1.024 (ref 1.005–1.030)

## 2016-03-18 LAB — CBG MONITORING, ED
GLUCOSE-CAPILLARY: 510 mg/dL — AB (ref 65–99)
GLUCOSE-CAPILLARY: 479 mg/dL — AB (ref 65–99)
Glucose-Capillary: 442 mg/dL — ABNORMAL HIGH (ref 65–99)

## 2016-03-18 LAB — RAPID URINE DRUG SCREEN, HOSP PERFORMED
AMPHETAMINES: NOT DETECTED
BENZODIAZEPINES: NOT DETECTED
Barbiturates: NOT DETECTED
COCAINE: NOT DETECTED
OPIATES: NOT DETECTED
TETRAHYDROCANNABINOL: NOT DETECTED

## 2016-03-18 LAB — URINE MICROSCOPIC-ADD ON

## 2016-03-18 LAB — MRSA PCR SCREENING: MRSA by PCR: NEGATIVE

## 2016-03-18 MED ORDER — BUSPIRONE HCL 5 MG PO TABS
5.0000 mg | ORAL_TABLET | Freq: Three times a day (TID) | ORAL | Status: DC
  Administered 2016-03-18 – 2016-03-20 (×6): 5 mg via ORAL
  Filled 2016-03-18 (×6): qty 1

## 2016-03-18 MED ORDER — LORAZEPAM 2 MG/ML IJ SOLN: 0.5000 mg | Freq: Four times a day (QID) | INTRAMUSCULAR | Status: DC | PRN

## 2016-03-18 MED ORDER — LORAZEPAM 2 MG/ML IJ SOLN
0.5000 mg | Freq: Once | INTRAMUSCULAR | Status: AC
  Administered 2016-03-18: 0.5 mg via INTRAVENOUS
  Filled 2016-03-18: qty 1

## 2016-03-18 MED ORDER — SODIUM CHLORIDE 0.9 % IV SOLN
INTRAVENOUS | Status: DC
  Administered 2016-03-18: 13:00:00 via INTRAVENOUS
  Filled 2016-03-18: qty 2.5

## 2016-03-18 MED ORDER — PREGABALIN 75 MG PO CAPS
150.0000 mg | ORAL_CAPSULE | Freq: Two times a day (BID) | ORAL | Status: DC
  Administered 2016-03-18 – 2016-03-20 (×4): 150 mg via ORAL
  Filled 2016-03-18 (×4): qty 2

## 2016-03-18 MED ORDER — FENTANYL CITRATE (PF) 100 MCG/2ML IJ SOLN
25.0000 ug | Freq: Once | INTRAMUSCULAR | Status: AC
Start: 2016-03-18 — End: 2016-03-18
  Administered 2016-03-18: 25 ug via INTRAVENOUS
  Filled 2016-03-18: qty 2

## 2016-03-18 MED ORDER — METOCLOPRAMIDE HCL 5 MG/ML IJ SOLN
10.0000 mg | Freq: Once | INTRAMUSCULAR | Status: AC
  Administered 2016-03-18: 10 mg via INTRAMUSCULAR
  Filled 2016-03-18: qty 2

## 2016-03-18 MED ORDER — MORPHINE SULFATE (PF) 2 MG/ML IV SOLN
2.0000 mg | INTRAVENOUS | Status: DC | PRN
  Administered 2016-03-18 (×2): 2 mg via INTRAVENOUS
  Filled 2016-03-18 (×3): qty 1

## 2016-03-18 MED ORDER — SODIUM CHLORIDE 0.9 % IV SOLN
INTRAVENOUS | Status: DC
  Filled 2016-03-18: qty 2.5

## 2016-03-18 MED ORDER — SODIUM CHLORIDE 0.9 % IV BOLUS (SEPSIS)
1000.0000 mL | Freq: Once | INTRAVENOUS | Status: AC
  Administered 2016-03-18: 1000 mL via INTRAVENOUS

## 2016-03-18 MED ORDER — METOCLOPRAMIDE HCL 5 MG/ML IJ SOLN
5.0000 mg | Freq: Four times a day (QID) | INTRAMUSCULAR | Status: DC
  Administered 2016-03-18 – 2016-03-19 (×3): 5 mg via INTRAVENOUS
  Filled 2016-03-18 (×2): qty 2

## 2016-03-18 MED ORDER — DEXTROSE-NACL 5-0.45 % IV SOLN
INTRAVENOUS | Status: DC
  Administered 2016-03-18 – 2016-03-19 (×2): via INTRAVENOUS

## 2016-03-18 MED ORDER — INSULIN REGULAR BOLUS VIA INFUSION
0.0000 [IU] | Freq: Three times a day (TID) | INTRAVENOUS | Status: DC
  Filled 2016-03-18: qty 10

## 2016-03-18 MED ORDER — ENOXAPARIN SODIUM 40 MG/0.4ML ~~LOC~~ SOLN
40.0000 mg | SUBCUTANEOUS | Status: DC
  Administered 2016-03-18 – 2016-03-19 (×2): 40 mg via SUBCUTANEOUS
  Filled 2016-03-18 (×2): qty 0.4

## 2016-03-18 MED ORDER — SODIUM CHLORIDE 0.9 % IV SOLN: INTRAVENOUS | Status: DC

## 2016-03-18 MED ORDER — DEXTROSE 50 % IV SOLN: 25.0000 mL | INTRAVENOUS | Status: DC | PRN

## 2016-03-18 MED ORDER — SODIUM CHLORIDE 0.9 % IV BOLUS (SEPSIS)
2000.0000 mL | Freq: Once | INTRAVENOUS | Status: AC
  Administered 2016-03-18: 2000 mL via INTRAVENOUS

## 2016-03-18 MED ORDER — DEXTROSE-NACL 5-0.45 % IV SOLN: INTRAVENOUS | Status: DC

## 2016-03-18 NOTE — H&P (Signed)
History and Physical    [name] [id number] DOB: [birth year] DOA: 03/18/2016  PCP: [name], MD Patient coming from: Home  Chief Complaint: Abdominal pain, nausea, vomiting  HPI: [name] is a 50 y.o. female with medical history significant of diabetes type 1, gastroparesis, depression, hypertension, hyperlipidemia. Patient presents with a one-day history of severe abdominal pain, nausea, vomiting and states that her gastroparesis is acting up. Pain is described as periumbilical and achy. Emesis is nonbloody and nonbilious. Symptoms have been worsening since yesterday. She was evaluated in the ED yesterday and discharged home. She states that she has been taking her insulin as prescribed. She has been trying to take Reglan, however, she is not able to keep it down.  ED Course: Vitals: Afebrile, tachycardia, mild tachypnea Labs: Glucose of 510. Creatinine elevated to 1.25. Potassium 5.3. White blood cell count at 13.2K. Urine ketones on urinalysis Imaging: None obtained Medications/Course: Patient given Reglan and started on an insulin drip. She has been given 3 L total bolus of normal saline  Review of Systems: Review of Systems  Constitutional: Negative for chills, fever and malaise/fatigue.  Respiratory: Negative for cough, hemoptysis, sputum production, shortness of breath and wheezing.   Cardiovascular: Negative for chest pain and palpitations.  Gastrointestinal: Positive for abdominal pain, nausea and vomiting. Negative for constipation and diarrhea.  Genitourinary: Negative for dysuria, frequency and urgency.  All other systems reviewed and are negative.   Past Medical History:  Diagnosis Date  . Depression   . Diabetes mellitus without complication ([employer])   . Essential hypertension   . Gastroparesis   . GERD (gastroesophageal reflux disease)   . HLD (hyperlipidemia)     Past Surgical History:  Procedure Laterality Date  . COLONOSCOPY  09/27/2014   at  [facility]  . ESOPHAGOGASTRODUODENOSCOPY  09/27/2014   at [facility], Dr Rolan Lipa. biospy neg for celiac, neg for H pylori.   . EYE SURGERY    . gailstones    . IR GENERIC HISTORICAL  01/24/2016   IR FLUORO GUIDE CV LINE RIGHT 01/24/2016 Darrell K Allred, PA-C [facility]  . IR GENERIC HISTORICAL  01/24/2016   IR US GUIDE VASC ACCESS RIGHT 01/24/2016 Darrell K Allred, PA-C [facility]  . POSTERIOR VITRECTOMY AND MEMBRANE PEEL-LEFT EYE  09/28/2002  . POSTERIOR VITRECTOMY AND MEMBRANE PEEL-RIGHT EYE  03/16/2002  . RETINAL DETACHMENT SURGERY       reports that she has never smoked. She has never used smokeless tobacco. She reports that she does not drink alcohol or use drugs.  Allergies  Allergen Reactions  . Anesthetics, Amide Nausea And Vomiting  . Penicillins Diarrhea and Nausea And Vomiting    Has patient had a PCN reaction causing immediate rash, facial/tongue/throat swelling, SOB or lightheadedness with hypotension: No Has patient had a PCN reaction causing severe rash involving mucus membranes or skin necrosis: No Has patient had a PCN reaction that required hospitalization No Has patient had a PCN reaction occurring within the last 10 years: Yes  If all of the above answers are "NO", then may proceed with Cephalosporin use.   . Buprenorphine Hcl Rash  . Encainide Nausea And Vomiting    Family History  Problem Relation Age of Onset  . Cystic fibrosis Mother   . Hypertension Father   . Diabetes Brother   . Hypertension Maternal Grandmother     Prior to Admission medications   Medication Sig Start Date End Date Taking? Authorizing Provider  albuterol (PROVENTIL HFA;VENTOLIN HFA) 108 (90 Base)  MCG/ACT inhaler Inhale 2 puffs into the lungs every 6 (six) hours as needed for wheezing or shortness of breath. 10/20/15  Yes [name], MD  busPIRone (BUSPAR) 5 MG tablet Take 1 tablet (5 mg total) by mouth 3 (three) times daily. 01/31/16  Yes [name], MD  furosemide (LASIX) 40 MG tablet  Take 40 mg by mouth daily as needed for fluid.    Yes Historical Provider, MD  insulin aspart (NOVOLOG) 100 UNIT/ML injection Inject 0-15 Units into the skin 3 (three) times daily with meals. Per sliding scale 01/31/16  Yes [name], MD  insulin detemir (LEVEMIR) 100 UNIT/ML injection Inject 0.3 mLs (30 Units total) into the skin at bedtime. 02/21/16  Yes Nishant Dhungel, MD  lisinopril (PRINIVIL,ZESTRIL) 10 MG tablet Take 1 tablet (10 mg total) by mouth daily. 03/13/16  Yes [name], MD  metoCLOPramide (REGLAN) 5 MG/5ML solution Take 5 mLs (5 mg total) by mouth 4 (four) times daily -  before meals and at bedtime. 03/13/16  Yes [name], MD  ondansetron (ZOFRAN ODT) 4 MG disintegrating tablet Take 1 tablet (4 mg total) by mouth every 8 (eight) hours as needed for nausea. 01/31/16  Yes [name], MD  polyethylene glycol powder (GLYCOLAX/MIRALAX) powder Take 255 g by mouth daily as needed for moderate constipation. Reported on 07/19/2015 03/13/16  Yes [name], MD  pregabalin (LYRICA) 150 MG capsule Take 1 capsule (150 mg total) by mouth 2 (two) times daily. 03/13/16  Yes [name], MD  Blood Glucose Monitoring Suppl (TRUE METRIX METER) DEVI 1 each by Does not apply route 3 (three) times daily before meals. 07/19/15   [name], MD  glucose blood (TRUE METRIX BLOOD GLUCOSE TEST) test strip Use 3 times daily before meals 03/13/16   [name], MD  Insulin Syringe-Needle U-100 (BD INSULIN SYRINGE ULTRAFINE) 31G X 15/64" 0.5 ML MISC 1 each by Does not apply route 4 (four) times daily as needed. 08/16/15   [name], MD    Physical Exam: Vitals:   03/18/16 1400 03/18/16 1448 03/18/16 1500 03/18/16 1504  BP: 137/63 171/69 (!) 100/48   Pulse: (!) 122     Resp: 22 (!) 42 19 19  Temp:      TempSrc:      SpO2: 100%        Constitutional: NAD, calm, appears to be in pain and agitated Eyes: PERRL, lids and conjunctivae normal ENMT: Mucous membranes are moist. Posterior pharynx clear of  any exudate or lesions.Normal dentition.  Neck: normal, supple, no masses Respiratory: clear to auscultation bilaterally, no wheezing, no crackles. Normal respiratory effort. No accessory muscle use.  Cardiovascular: Increased rate and regular rhythm, no murmurs / rubs / gallops. No extremity edema. 2+ pedal pulses. No carotid bruits.  Abdomen: mild epigastric tenderness, no masses palpated. No hepatosplenomegaly. Bowel sounds positive.  Musculoskeletal: no clubbing / cyanosis. No joint deformity upper and lower extremities. Good ROM, no contractures. Normal muscle tone.  Skin: no rashes, lesions, ulcers. No induration Neurologic: CN 2-12 grossly intact. Sensation intact, DTR normal. Strength 4/5 in all 4.  Psychiatric: Normal judgment and insight. Alert and oriented x 3. Anxious.   Labs on Admission: I have personally reviewed following labs and imaging studies  CBC:  Recent Labs Lab 03/17/16 1301 03/18/16 1023  WBC 6.5 13.2*  NEUTROABS 4.7 12.1*  HGB 10.9* 10.8*  HCT 36.3 37.2  MCV 71.6* 74.8*  PLT 467* 0000000*   Basic Metabolic Panel:  Recent Labs Lab 03/17/16  1301 03/18/16 1023  NA 135 138  K 5.0 5.3*  CL 102 104  CO2 21* 11*  GLUCOSE 212* 551*  BUN 18 17  CREATININE 1.03* 1.25*  CALCIUM 10.3 10.2   GFR: Estimated Creatinine Clearance: 48.8 mL/min (by C-G formula based on SCr of 1.25 mg/dL (H)). Liver Function Tests:  Recent Labs Lab 03/17/16 1301  AST 30  ALT 16  ALKPHOS 98  BILITOT 0.8  PROT 9.1*  ALBUMIN 4.6    Recent Labs Lab 03/17/16 1301  LIPASE 45   No results for input(s): AMMONIA in the last 168 hours. Coagulation Profile: No results for input(s): INR, PROTIME in the last 168 hours. Cardiac Enzymes: No results for input(s): CKTOTAL, CKMB, CKMBINDEX, TROPONINI in the last 168 hours. BNP (last 3 results) No results for input(s): PROBNP in the last 8760 hours. HbA1C: No results for input(s): HGBA1C in the last 72 hours. CBG:  Recent  Labs Lab 03/17/16 1905 03/18/16 1212 03/18/16 1336 03/18/16 1446  GLUCAP 344* 510* 479* 442*   Lipid Profile: No results for input(s): CHOL, HDL, LDLCALC, TRIG, CHOLHDL, LDLDIRECT in the last 72 hours. Thyroid Function Tests: No results for input(s): TSH, T4TOTAL, FREET4, T3FREE, THYROIDAB in the last 72 hours. Anemia Panel: No results for input(s): VITAMINB12, FOLATE, FERRITIN, TIBC, IRON, RETICCTPCT in the last 72 hours. Urine analysis:    Component Value Date/Time   COLORURINE YELLOW 03/18/2016 0805   APPEARANCEUR CLEAR 03/18/2016 0805   LABSPEC 1.024 03/18/2016 0805   PHURINE 5.0 03/18/2016 0805   GLUCOSEU >1000 (A) 03/18/2016 0805   HGBUR TRACE (A) 03/18/2016 0805   BILIRUBINUR NEGATIVE 03/18/2016 0805   BILIRUBINUR NEGATIVE 08/04/2012 1005   KETONESUR >80 (A) 03/18/2016 0805   PROTEINUR 100 (A) 03/18/2016 0805   UROBILINOGEN 0.2 04/27/2015 1608   NITRITE NEGATIVE 03/18/2016 0805   LEUKOCYTESUR NEGATIVE 03/18/2016 0805   Radiological Exams on Admission: No results found.  Assessment/Plan Active Problems:   Anxiety and depression   Diabetic gastroparesis (H[facility])   AKI (acute kidney injury) ([place])   HLD (hyperlipidemia)   Essential hypertension   DKA (diabetic ketoacidoses) ([place])   DKA Diabetes mellitus, type 1 Anion gap of 23 with a CO2 of 11. Hyperglycemia on admission. -Continue Glucomander protocol -sips with meds as tolerated -BMP every 4 hours -Switched to home regimen once anion gap is closed 2  Acute kidney injury Likely secondary to dehydration -Follow BMP -Fluid resuscitate  Gastroparesis -Reglan every 6 hours  Hypertension -Hold lisinopril secondary to AKI  Anxiety Depression -Continue BuSpar   DVT prophylaxis: Lovenox Code Status: Full code Family Communication: None at bedside Disposition Plan: Likely discharge home in 2-3 days Consults called: None Admission status: Inpatient, stepdown unit   Cordelia Poche, MD [facility] Pager [phone]  If 7PM-7AM, please contact night-coverage [email or web address] Password [facility]  03/18/2016, 3:45 PM

## 2016-03-18 NOTE — ED Notes (Addendum)
Report given to Shanon Brow, RN, ICU.

## 2016-03-18 NOTE — ED Notes (Signed)
Attempted IV, unsuccessful. IV team consult ordered.

## 2016-03-18 NOTE — ED Notes (Signed)
Phlebotomy to attempt blood draw, IV access by IV team but unable to get blood.

## 2016-03-18 NOTE — ED Notes (Signed)
Bed: WA03 Expected date:  Expected time:  Means of arrival:  Comments: EMS 50 yo F, abd pain

## 2016-03-18 NOTE — ED Notes (Signed)
Pt voided all over bed will attempt to collect UA later

## 2016-03-18 NOTE — ED Provider Notes (Signed)
[facility] DEPT Provider Note   CSN: [id number] Arrival date & time: 03/18/16  [id number]     History   Chief Complaint Chief Complaint  Patient presents with  . Nausea  . Emesis    HPI [name] is a 50 y.o. female.  He is here for evaluation of ongoing nausea and vomiting for several days. Is evaluated and treated in the emergency department yesterday. She states she took her medications last night but none this morning. She has history of recurrent vomiting associated with diagnosed, severe gastroparesis. Last gastric emptying study 2016. She is unable to give additional history because she states "I can't talk".  Level V caveat- severe discomfort  HPI  Past Medical History:  Diagnosis Date  . Depression   . Diabetes mellitus without complication ([place])   . Essential hypertension   . Gastroparesis   . GERD (gastroesophageal reflux disease)   . HLD (hyperlipidemia)     Patient Active Problem List   Diagnosis Date Noted  . Noncompliance with treatment plan 03/13/2016  . Essential hypertension 01/24/2016  . Leukocytosis 01/24/2016  . Prolonged QT interval 11/27/2015  . Abnormal ECG 11/27/2015  . HLD (hyperlipidemia)   . GERD (gastroesophageal reflux disease)   . Metabolic acidosis, normal anion gap (NAG)   . AKI (acute kidney injury) ([place])   . Anemia, iron deficiency   . DKA, type 1, not at goal [facility])   . Hyponatremia 09/02/2015  . Vitamin B12 deficiency 08/16/2015  . Lactic acidosis 07/29/2015  . Acute pyelonephritis 07/29/2015  . Pyelonephritis   . Hypothermia   . Diabetic gastroparesis ([place])   . Diabetic neuropathy, type I diabetes mellitus ([place]) 05/18/2015  . Anemia 05/18/2015  . Anxiety and depression 05/18/2015  . Nausea & vomiting   . Diabetic ketoacidosis without coma associated with type 1 diabetes mellitus ([place])   . High anion gap metabolic acidosis [id number]    Past Surgical History:  Procedure Laterality Date  . COLONOSCOPY  09/27/2014   at [facility]  . ESOPHAGOGASTRODUODENOSCOPY  09/27/2014   at [facility], Dr Rolan Lipa. biospy neg for celiac, neg for H pylori.   . EYE SURGERY    . gailstones    . IR GENERIC HISTORICAL  01/24/2016   IR FLUORO GUIDE CV LINE RIGHT 01/24/2016 Darrell K Allred, PA-C [facility]-INTERV RAD  . IR GENERIC HISTORICAL  01/24/2016   IR US GUIDE VASC ACCESS RIGHT 01/24/2016 Darrell K Allred, PA-C [facility]  . POSTERIOR VITRECTOMY AND MEMBRANE PEEL-LEFT EYE  09/28/2002  . POSTERIOR VITRECTOMY AND MEMBRANE PEEL-RIGHT EYE  03/16/2002  . RETINAL DETACHMENT SURGERY      OB History    No data available       Home Medications    Prior to Admission medications   Medication Sig Start Date End Date Taking? Authorizing Provider  albuterol (PROVENTIL HFA;VENTOLIN HFA) 108 (90 Base) MCG/ACT inhaler Inhale 2 puffs into the lungs every 6 (six) hours as needed for wheezing or shortness of breath. 10/20/15  Yes [name], MD  busPIRone (BUSPAR) 5 MG tablet Take 1 tablet (5 mg total) by mouth 3 (three) times daily. 01/31/16  Yes [name], MD  furosemide (LASIX) 40 MG tablet Take 40 mg by mouth daily as needed for fluid.    Yes Historical Provider, MD  insulin aspart (NOVOLOG) 100 UNIT/ML injection Inject 0-15 Units into the skin 3 (three) times daily with meals. Per sliding scale 01/31/16  Yes [name], MD  insulin detemir (LEVEMIR) 100 UNIT/ML injection  Inject 0.3 mLs (30 Units total) into the skin at bedtime. 02/21/16  Yes Nishant Dhungel, MD  lisinopril (PRINIVIL,ZESTRIL) 10 MG tablet Take 1 tablet (10 mg total) by mouth daily. 03/13/16  Yes [name], MD  metoCLOPramide (REGLAN) 5 MG/5ML solution Take 5 mLs (5 mg total) by mouth 4 (four) times daily -  before meals and at bedtime. 03/13/16  Yes [name], MD  ondansetron (ZOFRAN ODT) 4 MG disintegrating tablet Take 1 tablet (4 mg total) by mouth every 8 (eight) hours as needed for nausea. 01/31/16  Yes [name], MD  polyethylene glycol powder (GLYCOLAX/MIRALAX)  powder Take 255 g by mouth daily as needed for moderate constipation. Reported on 07/19/2015 03/13/16  Yes [name], MD  pregabalin (LYRICA) 150 MG capsule Take 1 capsule (150 mg total) by mouth 2 (two) times daily. 03/13/16  Yes [name], MD  Blood Glucose Monitoring Suppl (TRUE METRIX METER) DEVI 1 each by Does not apply route 3 (three) times daily before meals. 07/19/15   [name], MD  glucose blood (TRUE METRIX BLOOD GLUCOSE TEST) test strip Use 3 times daily before meals 03/13/16   [name], MD  Insulin Syringe-Needle U-100 (BD INSULIN SYRINGE ULTRAFINE) 31G X 15/64" 0.5 ML MISC 1 each by Does not apply route 4 (four) times daily as needed. 08/16/15   [name], MD    Family History Family History  Problem Relation Age of Onset  . Cystic fibrosis Mother   . Hypertension Father   . Diabetes Brother   . Hypertension Maternal Grandmother     Social History Social History  Substance Use Topics  . Smoking status: Never Smoker  . Smokeless tobacco: Never Used  . Alcohol use No     Allergies   Anesthetics, amide; Penicillins; Buprenorphine hcl; and Encainide   Review of Systems Review of Systems  Unable to perform ROS: Acuity of condition     Physical Exam Updated Vital Signs BP 167/73 (BP Location: Right Arm)   Pulse (!) 121   Temp 98.5 F (36.9 C) (Oral)   Resp 13   LMP 02/15/2016   SpO2 100%   Physical Exam  Constitutional: She is oriented to person, place, and time. She appears well-developed. She appears distressed (She is uncomfortable).  Appears much older than stated age.  HENT:  Head: Normocephalic and atraumatic.  Mouth/Throat: Oropharynx is clear and moist.  Eyes: Conjunctivae and EOM are normal. Pupils are equal, round, and reactive to light.  Neck: Normal range of motion and phonation normal. Neck supple.  Cardiovascular: Normal rate and regular rhythm.   Hypertensive, tachycardic.  Pulmonary/Chest: Effort normal and breath sounds normal.  She exhibits no tenderness.  Tachypneic  Abdominal: Soft. She exhibits no distension and no mass. There is tenderness (Diffuse pain to light touch, moderate). There is guarding. There is no rebound.  Musculoskeletal: Normal range of motion.  Neurological: She is alert and oriented to person, place, and time. She exhibits normal muscle tone.  Skin: Skin is warm and dry.  Psychiatric: She has a normal mood and affect. Her behavior is normal. Judgment and thought content normal.  Nursing note and vitals reviewed.    ED Treatments / Results  Labs (all labs ordered are listed, but only abnormal results are displayed) Labs Reviewed  BASIC METABOLIC PANEL - Abnormal; Notable for the following:       Result Value   Potassium 5.3 (*)    CO2 11 (*)    Glucose, Bld 551 (*)  Creatinine, Ser 1.25 (*)    GFR calc non Af Amer 50 (*)    GFR calc Af Amer 58 (*)    All other components within normal limits  CBC WITH DIFFERENTIAL/PLATELET - Abnormal; Notable for the following:    WBC 13.2 (*)    Hemoglobin 10.8 (*)    MCV 74.8 (*)    MCH 21.7 (*)    MCHC 29.0 (*)    RDW 16.3 (*)    Platelets 487 (*)    Neutro Abs 12.1 (*)    All other components within normal limits  URINALYSIS, ROUTINE W REFLEX MICROSCOPIC (NOT AT [facility]) - Abnormal; Notable for the following:    Glucose, UA >1000 (*)    Hgb urine dipstick TRACE (*)    Ketones, ur >80 (*)    Protein, ur 100 (*)    All other components within normal limits  URINE MICROSCOPIC-ADD ON - Abnormal; Notable for the following:    Squamous Epithelial / LPF 6-30 (*)    Bacteria, UA FEW (*)    All other components within normal limits  CBG MONITORING, ED - Abnormal; Notable for the following:    Glucose-Capillary 510 (*)    All other components within normal limits  URINE RAPID DRUG SCREEN, HOSP PERFORMED    EKG  EKG Interpretation None       Radiology No results found.  Procedures Procedures (including critical care  time)  Medications Ordered in ED Medications  sodium chloride 0.9 % bolus 2,000 mL (not administered)  dextrose 5 %-0.45 % sodium chloride infusion (not administered)  insulin regular bolus via infusion 0-10 Units (not administered)  insulin regular (NOVOLIN R,HUMULIN R) 250 Units in sodium chloride 0.9 % 250 mL (1 Units/mL) infusion ( Intravenous New Bag/Given 03/18/16 1232)  dextrose 50 % solution 25 mL (not administered)  0.9 %  sodium chloride infusion (not administered)  sodium chloride 0.9 % bolus 1,000 mL (1,000 mLs Intravenous New Bag/Given 03/18/16 0935)  metoCLOPramide (REGLAN) injection 10 mg (10 mg Intramuscular Given 03/18/16 [id number])     Initial Impression / Assessment and Plan / ED Course  I have reviewed the triage vital signs and the nursing notes.  Pertinent labs & imaging results that were available during my care of the patient were reviewed by me and considered in my medical decision making (see chart for details).  Clinical Course    Medications  sodium chloride 0.9 % bolus 2,000 mL (not administered)  dextrose 5 %-0.45 % sodium chloride infusion (not administered)  insulin regular bolus via infusion 0-10 Units (not administered)  insulin regular (NOVOLIN R,HUMULIN R) 250 Units in sodium chloride 0.9 % 250 mL (1 Units/mL) infusion ( Intravenous New Bag/Given 03/18/16 1232)  dextrose 50 % solution 25 mL (not administered)  0.9 %  sodium chloride infusion (not administered)  sodium chloride 0.9 % bolus 1,000 mL (1,000 mLs Intravenous New Bag/Given 03/18/16 0935)  metoCLOPramide (REGLAN) injection 10 mg (10 mg Intramuscular Given 03/18/16 0812)      CRITICAL CARE Performed by: Daleen Bo L Total critical care time: 40 minutes Critical care time was exclusive of separately billable procedures and treating other patients. Critical care was necessary to treat or prevent imminent or life-threatening deterioration. Critical care was time spent personally by me on the  following activities: development of treatment plan with patient and/or surrogate as well as nursing, discussions with consultants, evaluation of patient's response to treatment, examination of patient, obtaining history from patient or surrogate, ordering and performing  treatments and interventions, ordering and review of laboratory studies, ordering and review of radiographic studies, pulse oximetry and re-evaluation of patient's condition.  Patient Vitals for the past 24 hrs:  BP Temp Temp src Pulse Resp SpO2  03/18/16 1216 167/73 - - (!) 121 13 100 %  03/18/16 1000 164/71 - - 114 - 99 %  03/18/16 0946 168/69 - - (!) 121 24 100 %  03/18/16 0945 168/69 - - 119 - 100 %  03/18/16 0730 160/72 98.5 F (36.9 C) Oral 108 (!) 32 100 %    1:17 PM Reevaluation with update and discussion. After initial assessment and treatment, an updated evaluation reveals She continues to be uncomfortable with nausea and abdominal pain. Additional medication ordered. Shaylyn Bawa L    Final Clinical Impressions(s) / ED Diagnoses   Final diagnoses:  Diabetic ketoacidosis without coma associated with type 1 diabetes mellitus (HCC)  Gastroparesis  Abdominal pain, unspecified abdominal location    Gastroparesis with vomiting, likely leading to difficulty complying with  usual medications. DKA with Dehydration. No improvement with ED treatment. Recurrent problems similar to this. She will require admission for stabilization.  Nursing Notes Reviewed/ Care Coordinated, and agree without changes. Applicable Imaging Reviewed.  Interpretation of Laboratory Data incorporated into ED treatment  Plan: Admit  New Prescriptions New Prescriptions   No medications on file     Daleen Bo, MD 03/18/16 [phone]

## 2016-03-18 NOTE — ED Triage Notes (Addendum)
Pt c/o nausea and vomiting. Takes Reglan, but states she has not taken this morning, only yesterday.  Seen at [facility] 03/17/2016 and discharged. Pt states symptoms still present. Pt given Zofran 4mg  IM by EMS.

## 2016-03-19 DIAGNOSIS — I10 Essential (primary) hypertension: Secondary | ICD-10-CM

## 2016-03-19 DIAGNOSIS — F418 Other specified anxiety disorders: Secondary | ICD-10-CM

## 2016-03-19 DIAGNOSIS — K3184 Gastroparesis: Secondary | ICD-10-CM

## 2016-03-19 DIAGNOSIS — E785 Hyperlipidemia, unspecified: Secondary | ICD-10-CM

## 2016-03-19 DIAGNOSIS — E1010 Type 1 diabetes mellitus with ketoacidosis without coma: Principal | ICD-10-CM

## 2016-03-19 DIAGNOSIS — E1143 Type 2 diabetes mellitus with diabetic autonomic (poly)neuropathy: Secondary | ICD-10-CM

## 2016-03-19 LAB — BASIC METABOLIC PANEL
ANION GAP: 7 (ref 5–15)
Anion gap: 6 (ref 5–15)
Anion gap: 8 (ref 5–15)
Anion gap: 9 (ref 5–15)
BUN: 18 mg/dL (ref 6–20)
BUN: 20 mg/dL (ref 6–20)
BUN: 20 mg/dL (ref 6–20)
BUN: 20 mg/dL (ref 6–20)
CHLORIDE: 108 mmol/L (ref 101–111)
CHLORIDE: 115 mmol/L — AB (ref 101–111)
CHLORIDE: 117 mmol/L — AB (ref 101–111)
CHLORIDE: 111 mmol/L (ref 101–111)
CO2: 17 mmol/L — ABNORMAL LOW (ref 22–32)
CO2: 19 mmol/L — ABNORMAL LOW (ref 22–32)
CO2: 19 mmol/L — ABNORMAL LOW (ref 22–32)
CO2: 20 mmol/L — ABNORMAL LOW (ref 22–32)
CREATININE: 1.3 mg/dL — AB (ref 0.44–1.00)
CREATININE: 1.35 mg/dL — AB (ref 0.44–1.00)
CREATININE: 1.24 mg/dL — AB (ref 0.44–1.00)
Calcium: 9 mg/dL (ref 8.9–10.3)
Calcium: 9 mg/dL (ref 8.9–10.3)
Calcium: 9 mg/dL (ref 8.9–10.3)
Calcium: 9.2 mg/dL (ref 8.9–10.3)
Creatinine, Ser: 1.18 mg/dL — ABNORMAL HIGH (ref 0.44–1.00)
GFR calc Af Amer: 53 mL/min — ABNORMAL LOW (ref >60)
GFR calc Af Amer: 55 mL/min — ABNORMAL LOW (ref >60)
GFR calc Af Amer: 59 mL/min — ABNORMAL LOW (ref >60)
GFR calc Af Amer: >60 mL/min (ref >60)
GFR calc non Af Amer: 48 mL/min — ABNORMAL LOW (ref >60)
GFR calc non Af Amer: 51 mL/min — ABNORMAL LOW (ref >60)
GFR, EST NON AFRICAN AMERICAN: 46 mL/min — AB (ref >60)
GFR, EST NON AFRICAN AMERICAN: 54 mL/min — AB (ref >60)
GLUCOSE: 136 mg/dL — AB (ref 65–99)
GLUCOSE: 143 mg/dL — AB (ref 65–99)
GLUCOSE: 174 mg/dL — AB (ref 65–99)
Glucose, Bld: 240 mg/dL — ABNORMAL HIGH (ref 65–99)
POTASSIUM: 4 mmol/L (ref 3.5–5.1)
Potassium: 4.1 mmol/L (ref 3.5–5.1)
Potassium: 4.3 mmol/L (ref 3.5–5.1)
Potassium: 4.3 mmol/L (ref 3.5–5.1)
SODIUM: 134 mmol/L — AB (ref 135–145)
SODIUM: 140 mmol/L (ref 135–145)
SODIUM: 143 mmol/L (ref 135–145)
SODIUM: 139 mmol/L (ref 135–145)

## 2016-03-19 LAB — GLUCOSE, CAPILLARY
GLUCOSE-CAPILLARY: 137 mg/dL — AB (ref 65–99)
GLUCOSE-CAPILLARY: 144 mg/dL — AB (ref 65–99)
GLUCOSE-CAPILLARY: 169 mg/dL — AB (ref 65–99)
GLUCOSE-CAPILLARY: 164 mg/dL — AB (ref 65–99)
GLUCOSE-CAPILLARY: 168 mg/dL — AB (ref 65–99)
GLUCOSE-CAPILLARY: 187 mg/dL — AB (ref 65–99)
GLUCOSE-CAPILLARY: 260 mg/dL — AB (ref 65–99)
GLUCOSE-CAPILLARY: 266 mg/dL — AB (ref 65–99)
Glucose-Capillary: 144 mg/dL — ABNORMAL HIGH (ref 65–99)
Glucose-Capillary: 158 mg/dL — ABNORMAL HIGH (ref 65–99)
Glucose-Capillary: 127 mg/dL — ABNORMAL HIGH (ref 65–99)
Glucose-Capillary: 134 mg/dL — ABNORMAL HIGH (ref 65–99)
Glucose-Capillary: 149 mg/dL — ABNORMAL HIGH (ref 65–99)
Glucose-Capillary: 203 mg/dL — ABNORMAL HIGH (ref 65–99)
Glucose-Capillary: 93 mg/dL (ref 65–99)

## 2016-03-19 MED ORDER — SODIUM CHLORIDE 0.9 % IV SOLN
250.0000 mg | Freq: Four times a day (QID) | INTRAVENOUS | Status: DC
  Filled 2016-03-19 (×2): qty 5

## 2016-03-19 MED ORDER — OXYCODONE HCL 5 MG PO TABS: 2.5000 mg | ORAL_TABLET | ORAL | Status: DC | PRN

## 2016-03-19 MED ORDER — INSULIN ASPART 100 UNIT/ML ~~LOC~~ SOLN
0.0000 [IU] | Freq: Three times a day (TID) | SUBCUTANEOUS | Status: DC
  Administered 2016-03-19: 3 [IU] via SUBCUTANEOUS
  Administered 2016-03-19: 8 [IU] via SUBCUTANEOUS
  Administered 2016-03-19: 3 [IU] via SUBCUTANEOUS

## 2016-03-19 MED ORDER — ERYTHROMYCIN LACTOBIONATE 500 MG IV SOLR
200.0000 mg | Freq: Three times a day (TID) | INTRAVENOUS | Status: DC
  Filled 2016-03-19: qty 4

## 2016-03-19 MED ORDER — INSULIN DETEMIR 100 UNIT/ML ~~LOC~~ SOLN: 30.0000 [IU] | Freq: Every day | SUBCUTANEOUS | Status: DC

## 2016-03-19 MED ORDER — INSULIN NPH (HUMAN) (ISOPHANE) 100 UNIT/ML ~~LOC~~ SUSP
20.0000 [IU] | Freq: Once | SUBCUTANEOUS | Status: AC
Start: 2016-03-19 — End: 2016-03-19
  Administered 2016-03-19: 20 [IU] via SUBCUTANEOUS
  Filled 2016-03-19: qty 10

## 2016-03-19 MED ORDER — ERYTHROMYCIN BASE 250 MG PO TABS
250.0000 mg | ORAL_TABLET | Freq: Three times a day (TID) | ORAL | Status: DC
  Administered 2016-03-19 – 2016-03-20 (×3): 250 mg via ORAL
  Filled 2016-03-19 (×4): qty 1

## 2016-03-19 MED ORDER — INSULIN DETEMIR 100 UNIT/ML ~~LOC~~ SOLN
20.0000 [IU] | Freq: Every day | SUBCUTANEOUS | Status: DC
  Administered 2016-03-19: 20 [IU] via SUBCUTANEOUS
  Filled 2016-03-19: qty 0.2

## 2016-03-19 MED ORDER — INSULIN ASPART 100 UNIT/ML ~~LOC~~ SOLN
0.0000 [IU] | Freq: Every day | SUBCUTANEOUS | Status: DC
  Administered 2016-03-19: 3 [IU] via SUBCUTANEOUS

## 2016-03-19 NOTE — Progress Notes (Signed)
Date:  March 19, 2016 Chart reviewed for concurrent status and case management needs. Will continue to follow the patient for status change: Discharge Planning: following for needs Expected discharge date: [id number] Rhonda Davis, BSN, [place], [place]

## 2016-03-19 NOTE — Progress Notes (Addendum)
PROGRESS NOTE  [name]  [id number] DOB: 12/25/1965 DOA: 03/18/2016 PCP: [name], MD  Brief Narrative:   [name] is a 50 y.o. female with medical history significant of diabetes type 1, gastroparesis, depression, hypertension, hyperlipidemia who presented with abdominal pain c/w gastroparesis pain and DKA secondary to noncompliance with her insulin.  Poor insight into diabetes management.  Reglan has not been working.  Gap closed and her bicarb is close to 20 this morning.    Assessment & Plan:   Principal Problem:   DKA (diabetic ketoacidoses) ([place]) Active Problems:   Anxiety and depression   Diabetic gastroparesis (HCC)   AKI (acute kidney injury) ([place])   HLD (hyperlipidemia)   Essential hypertension  DKA secondary to medication noncompliance.  Despite having diabetes mellitus type 1 for many years and 9 admissions since beginning of this year for DKA, the patient was still unable to explain to me what she should do with her insulin when she has problems with nausea and vomiting. After reviewing her records, it appears that despite counseling from numerous physicians, the patient has made up her mind about what she wants to do with her insulin and refuses to listen to advice. Despite this, she seemed receptive only talked about continuing to take Levemir even when she has nausea and vomiting making changes to the doses of her [place] Frieze acting insulin during gastroparesis flares. - We will give NPH this morning and then resume her Levemir this evening at 20 units with titration tomorrow - Start sliding scale insulin - Advance diet - A1c 9.4 01/19/2016 -  Needs Endocrinology follow up  Gastroparesis, likely tolerant to reglan -  D/c reglan -  Start erythromycin -  Needs close monitoring by Endocrinology in case she ever needs more invasive treatment   Chronic kidney disease, stage III, stable creatinine and near baseline.  Patient did not have AKI.     Hypertension, blood pressure stable -  Continue to hold ACEI for now  Anxiety, Depression, stable -Continue BuSpar  DVT prophylaxis:  Lovenox Code Status:  Full code Family Communication:  Patient alone Disposition Plan:  Transfer to insert BMP remained stable. Anticipate discharge to home tomorrow if tolerating diet.   Consultants:   Diabetic educator  Procedures:  Insulin drip  Antimicrobials:   None    Subjective: Patient states that her abdominal pain is markedly improved. She feels like she might be able to eat something this morning. Overall feeling better.  Objective: Vitals:   03/19/16 0000 03/19/16 0400 03/19/16 0700 03/19/16 0800  BP: (!) 149/56 (!) 127/46 (!) 121/53 138/63  Pulse: 96 95 95 94  Resp: (!) 24 (!) 25 14 20   Temp:  98 F (36.7 C)  98.4 F (36.9 C)  TempSrc:  Oral  Oral  SpO2: 97% 99% 99% 100%    Intake/Output Summary (Last 24 hours) at 03/19/16 1055 Last data filed at 03/19/16 0700  Gross per 24 hour  Intake          1375.15 ml  Output                0 ml  Net          1375.15 ml   There were no vitals filed for this visit.  Examination:  General exam:  Adult Female.  No acute distress.  HEENT:  NCAT, MMM Respiratory system: Clear to auscultation bilaterally Cardiovascular system: Regular rate and rhythm, normal S1/S2. No murmurs, rubs, gallops or clicks.  Warm extremities  Gastrointestinal system: Normal active bowel sounds, soft, nondistended, nontender. MSK:  Normal tone and bulk, no lower extremity edema Neuro:  Grossly intact    Data Reviewed: I have personally reviewed following labs and imaging studies  CBC:  Recent Labs Lab 03/17/16 1301 03/18/16 1023  WBC 6.5 13.2*  NEUTROABS 4.7 12.1*  HGB 10.9* 10.8*  HCT 36.3 37.2  MCV 71.6* 74.8*  PLT 467* 0000000*   Basic Metabolic Panel:  Recent Labs Lab 03/18/16 1757 03/18/16 2031 03/19/16 0011 03/19/16 0337 03/19/16 0759  NA 142 142 143 140 139  K 4.9 4.6  4.3 4.3 4.1  CL 112* 115* 117* 115* 111  CO2 15* 15* 17* 19* 20*  GLUCOSE 213* 145* 143* 136* 174*  BUN 20 21* 20 20 20   CREATININE 1.43* 1.29* 1.35* 1.30* 1.24*  CALCIUM 9.9 9.6 9.0 9.0 9.2   GFR: Estimated Creatinine Clearance: 49.2 mL/min (by C-G formula based on SCr of 1.24 mg/dL (H)). Liver Function Tests:  Recent Labs Lab 03/17/16 1301  AST 30  ALT 16  ALKPHOS 98  BILITOT 0.8  PROT 9.1*  ALBUMIN 4.6    Recent Labs Lab 03/17/16 1301  LIPASE 45   No results for input(s): AMMONIA in the last 168 hours. Coagulation Profile: No results for input(s): INR, PROTIME in the last 168 hours. Cardiac Enzymes: No results for input(s): CKTOTAL, CKMB, CKMBINDEX, TROPONINI in the last 168 hours. BNP (last 3 results) No results for input(s): PROBNP in the last 8760 hours. HbA1C: No results for input(s): HGBA1C in the last 72 hours. CBG:  Recent Labs Lab 03/19/16 0513 03/19/16 0604 03/19/16 0714 03/19/16 0824 03/19/16 0927  GLUCAP 169* 164* 203* 168* 187*   Lipid Profile: No results for input(s): CHOL, HDL, LDLCALC, TRIG, CHOLHDL, LDLDIRECT in the last 72 hours. Thyroid Function Tests: No results for input(s): TSH, T4TOTAL, FREET4, T3FREE, THYROIDAB in the last 72 hours. Anemia Panel: No results for input(s): VITAMINB12, FOLATE, FERRITIN, TIBC, IRON, RETICCTPCT in the last 72 hours. Urine analysis:    Component Value Date/Time   COLORURINE YELLOW 03/18/2016 0805   APPEARANCEUR CLEAR 03/18/2016 0805   LABSPEC 1.024 03/18/2016 0805   PHURINE 5.0 03/18/2016 0805   GLUCOSEU >1000 (A) 03/18/2016 0805   HGBUR TRACE (A) 03/18/2016 0805   BILIRUBINUR NEGATIVE 03/18/2016 0805   BILIRUBINUR NEGATIVE 08/04/2012 1005   KETONESUR >80 (A) 03/18/2016 0805   PROTEINUR 100 (A) 03/18/2016 0805   UROBILINOGEN 0.2 04/27/2015 1608   NITRITE NEGATIVE 03/18/2016 0805   LEUKOCYTESUR NEGATIVE 03/18/2016 0805   Sepsis Labs: @LABRCNTIP (procalcitonin:4,lacticidven:4)  ) Recent  Results (from the past 240 hour(s))  MRSA PCR Screening     Status: None   Collection Time: 03/18/16  3:58 PM  Result Value Ref Range Status   MRSA by PCR NEGATIVE NEGATIVE Final    Comment:        The GeneXpert MRSA Assay (FDA approved for NASAL specimens only), is one component of a comprehensive MRSA colonization surveillance program. It is not intended to diagnose MRSA infection nor to guide or monitor treatment for MRSA infections.       Radiology Studies: No results found.   Scheduled Meds: . busPIRone  5 mg Oral TID  . enoxaparin (LOVENOX) injection  40 mg Subcutaneous Q24H  . erythromycin  250 mg Intravenous Q6H  . insulin aspart  0-15 Units Subcutaneous TID WC  . insulin aspart  0-5 Units Subcutaneous QHS  . insulin detemir  20 Units Subcutaneous QHS  . pregabalin  150 mg Oral BID   Continuous Infusions:    LOS: 1 day    Time spent: 30 min    Janece Canterbury, MD [facility] Pager [phone]  If 7PM-7AM, please contact night-coverage [email or web address] Password [facility] 03/19/2016, 10:55 AM

## 2016-03-19 NOTE — Progress Notes (Signed)
Inpatient Diabetes Program Recommendations  AACE/ADA: New Consensus Statement on Inpatient Glycemic Control (2015)  Target Ranges:  Prepandial:   less than 140 mg/dL      Peak postprandial:   less than 180 mg/dL (1-2 hours)      Critically ill patients:  140 - 180 mg/dL   Results for YOSELIN, AMERMAN (MRN [id number]) as of 03/19/2016 09:45  Ref. Range 03/18/2016 10:23  Sodium Latest Ref Range: 135 - 145 mmol/L 138  Potassium Latest Ref Range: 3.5 - 5.1 mmol/L 5.3 (H)  Chloride Latest Ref Range: 101 - 111 mmol/L 104  CO2 Latest Ref Range: 22 - 32 mmol/L 11 (L)  BUN Latest Ref Range: 6 - 20 mg/dL 17  Creatinine Latest Ref Range: 0.44 - 1.00 mg/dL 1.25 (H)  Calcium Latest Ref Range: 8.9 - 10.3 mg/dL 10.2  EGFR (Non-African Amer.) Latest Ref Range: >60 mL/min 50 (L)  EGFR (African American) Latest Ref Range: >60 mL/min 58 (L)  Glucose Latest Ref Range: 65 - 99 mg/dL 551 (HH)    Admit with: DKA  History: Type 1 DM, Gastroparesis  Home DM Meds: Levemir 30 units QHS        Novolog 0-15 units tid per SSI  Current Insulin Orders: IV Insulin drip     -Note patient transitioning off IV Insulin drip this AM.  Received 20 units NPH X 1 dose at 7:44 am and Novolog Correction scale/SSI also started at 8am.  -To start Levemir tonight at bedtime.  -Patient well known to the Inpatient Diabetes Program.  This is patient's 9th admission since January for DKA/ Gastroparesis Flares.  -Last A1c was 9.4% back on 01/19/16.  Note that current A1c pending.  Seen by her PCP (Dr. Jarold Song at [facility]) on 03/13/18.  Patient was instructed to increase her Levemir to 25 units QHS at that visit b/c she was only taking Levemir 20 units QHS per MD notes.     --Will follow patient during hospitalization--  Wyn Quaker RN, MSN, CDE Diabetes Coordinator Inpatient Glycemic Control Team Team Pager: [phone] (8a-5p)

## 2016-03-20 LAB — BASIC METABOLIC PANEL
ANION GAP: 6 (ref 5–15)
BUN: 21 mg/dL — ABNORMAL HIGH (ref 6–20)
CALCIUM: 8.9 mg/dL (ref 8.9–10.3)
CHLORIDE: 112 mmol/L — AB (ref 101–111)
CO2: 20 mmol/L — AB (ref 22–32)
CREATININE: 1.08 mg/dL — AB (ref 0.44–1.00)
GFR, EST NON AFRICAN AMERICAN: 60 mL/min — AB (ref >60)
GLUCOSE: 60 mg/dL — AB (ref 65–99)
Potassium: 4 mmol/L (ref 3.5–5.1)
Sodium: 138 mmol/L (ref 135–145)

## 2016-03-20 LAB — HEMOGLOBIN A1C
HEMOGLOBIN A1C: 10.3 % — AB (ref 4.8–5.6)
Mean Plasma Glucose: 249 mg/dL

## 2016-03-20 LAB — GLUCOSE, CAPILLARY
GLUCOSE-CAPILLARY: 80 mg/dL (ref 65–99)
GLUCOSE-CAPILLARY: 172 mg/dL — AB (ref 65–99)
Glucose-Capillary: 54 mg/dL — ABNORMAL LOW (ref 65–99)
Glucose-Capillary: 154 mg/dL — ABNORMAL HIGH (ref 65–99)

## 2016-03-20 MED ORDER — INSULIN DETEMIR 100 UNIT/ML ~~LOC~~ SOLN: 25.0000 [IU] | Freq: Every day | SUBCUTANEOUS | 11 refills | Status: DC

## 2016-03-20 MED ORDER — INSULIN DETEMIR 100 UNIT/ML ~~LOC~~ SOLN
15.0000 [IU] | Freq: Every day | SUBCUTANEOUS | Status: DC
  Filled 2016-03-20: qty 0.15

## 2016-03-20 MED ORDER — INSULIN ASPART 100 UNIT/ML ~~LOC~~ SOLN
3.0000 [IU] | Freq: Three times a day (TID) | SUBCUTANEOUS | Status: DC
  Administered 2016-03-20 (×2): 3 [IU] via SUBCUTANEOUS

## 2016-03-20 MED ORDER — INSULIN ASPART 100 UNIT/ML ~~LOC~~ SOLN
0.0000 [IU] | Freq: Three times a day (TID) | SUBCUTANEOUS | Status: DC
  Administered 2016-03-20 (×2): 2 [IU] via SUBCUTANEOUS

## 2016-03-20 MED ORDER — POLYETHYLENE GLYCOL 3350 17 G PO PACK: 17.0000 g | PACK | Freq: Every day | ORAL | 0 refills | Status: DC | PRN

## 2016-03-20 MED ORDER — ERYTHROMYCIN BASE 250 MG PO TABS: 250.0000 mg | ORAL_TABLET | Freq: Three times a day (TID) | ORAL | 0 refills | Status: DC | PRN

## 2016-03-20 NOTE — Discharge Summary (Signed)
Physician Discharge Summary  [name] [id number] DOB: [birth year] DOA: 03/18/2016  PCP: Yvette Morale, MD  Admit date: 03/18/2016 Discharge date: 03/20/2016  Admitted From: home  Disposition:  home  Recommendations for Outpatient Follow-up:  1. Follow up with PCP in 1-2 weeks.  PCP has been trying to refer her to endocrinology however because she does not have insurance this is been difficult. 2. Please obtain BMP/CBC in one week 3. Diabetes management 4. Needs insurance assistance to afford Endocrinology  Home Health:  none  Equipment/Devices:  None  Discharge Condition:  Stable, improved CODE STATUS:  Full code  Diet recommendation:  Diabetic diet   Brief/Interim Summary:  Yvette Thomasis a 50 y.o.femalewith medical history significant of diabetes type 1, gastroparesis, depression, hypertension, hyperlipidemia who presented with abdominal pain c/w gastroparesis pain and DKA secondary to noncompliance with her insulin.   because of her nausea and vomiting, she stopped taking all of her insulin. Poor insight into diabetes management.  Reglan has not been working recently.   at the time of admission, her bicarbonate was 15 and her anion gap was 15. She was started on an insulin drip and her gap quickly closed. She was transitioned to subcutaneous insulin. She was given education by the diabetic educator and by myself regarding sick day management including the importance of continuing to take her long-acting insulin despite illness. She was advised to make modifications to her Jadian Karman acting insulin if she is not eating meals. She will discuss this with her primary care doctor. Her Reglan was discontinued because it was no longer working for gastroparesis and she was given a prescription for erythromycin.      Discharge Diagnoses:  Principal Problem:   DKA (diabetic ketoacidoses) ([place]) Active Problems:   Anxiety and depression   Diabetic gastroparesis (HCC)   HLD  (hyperlipidemia)   Essential hypertension  DKA secondary to medication noncompliance.  Despite having diabetes mellitus type 1 for many years and 9 admissions since beginning of this year for DKA, the patient was still unable to explain to me what she should do with her insulin when she has problems with nausea and vomiting.  - Recommended she follow her PCP's advice for insulin use: - levemir 25 units QHS - Custom sliding scale insulin per PCP -  Hemoglobin A1c 10.3 on 03/19/2016  Gastroparesis, regular and no longer working. -  Started erythromycin -  Tolerating a regular diabetic diet prior to discharge  Chronic kidney disease, stage III, stable creatinine and near baseline.  Patient did not have AKI.    Hypertension, blood pressure low normal -  Continue to hold ACEI for now  Anxiety, Depression, stable -Continue BuSpar  Discharge Instructions  Discharge Instructions    Call MD for:  difficulty breathing, headache or visual disturbances    Complete by:  As directed    Call MD for:  extreme fatigue    Complete by:  As directed    Call MD for:  hives    Complete by:  As directed    Call MD for:  persistant dizziness or light-headedness    Complete by:  As directed    Call MD for:  persistant nausea and vomiting    Complete by:  As directed    Call MD for:  severe uncontrolled pain    Complete by:  As directed    Call MD for:  temperature >100.4    Complete by:  As directed    Diet Carb Modified  Complete by:  As directed    Discharge instructions    Complete by:  As directed    Please continue to take your insulin 25 units at bedtime and the sliding scale that was outlined by your primary care doctor.  Try using erythromycin just before meals if needed.   Increase activity slowly    Complete by:  As directed        Medication List    STOP taking these medications   lisinopril 10 MG tablet Commonly known as:  PRINIVIL,ZESTRIL   metoCLOPramide 5 MG/5ML  solution Commonly known as:  REGLAN   polyethylene glycol powder powder Commonly known as:  GLYCOLAX/MIRALAX Replaced by:  polyethylene glycol packet     TAKE these medications   albuterol 108 (90 Base) MCG/ACT inhaler Commonly known as:  PROVENTIL HFA;VENTOLIN HFA Inhale 2 puffs into the lungs every 6 (six) hours as needed for wheezing or shortness of breath.   busPIRone 5 MG tablet Commonly known as:  BUSPAR Take 1 tablet (5 mg total) by mouth 3 (three) times daily.   erythromycin 250 MG tablet Commonly known as:  E-MYCIN Take 1 tablet (250 mg total) by mouth 3 (three) times daily with meals as needed.   furosemide 40 MG tablet Commonly known as:  LASIX Take 40 mg by mouth daily as needed for fluid.   glucose blood test strip Commonly known as:  TRUE METRIX BLOOD GLUCOSE TEST Use 3 times daily before meals   insulin aspart 100 UNIT/ML injection Commonly known as:  novoLOG Inject 0-15 Units into the skin 3 (three) times daily with meals. Per sliding scale   insulin detemir 100 UNIT/ML injection Commonly known as:  LEVEMIR Inject 0.25 mLs (25 Units total) into the skin at bedtime. What changed:  how much to take   Insulin Syringe-Needle U-100 31G X 15/64" 0.5 ML Misc Commonly known as:  BD INSULIN SYRINGE ULTRAFINE 1 each by Does not apply route 4 (four) times daily as needed.   ondansetron 4 MG disintegrating tablet Commonly known as:  ZOFRAN ODT Take 1 tablet (4 mg total) by mouth every 8 (eight) hours as needed for nausea.   polyethylene glycol packet Commonly known as:  MIRALAX / GLYCOLAX Take 17 g by mouth daily as needed for mild constipation. Replaces:  polyethylene glycol powder powder   pregabalin 150 MG capsule Commonly known as:  LYRICA Take 1 capsule (150 mg total) by mouth 2 (two) times daily.   TRUE METRIX METER Devi 1 each by Does not apply route 3 (three) times daily before meals.      Follow-up Information    [facility] on 03/28/2016.   Why:  at 11:15 Contact information: [place] [phone]         Allergies  Allergen Reactions  . Anesthetics, Amide Nausea And Vomiting  . Penicillins Diarrhea and Nausea And Vomiting    Has patient had a PCN reaction causing immediate rash, facial/tongue/throat swelling, SOB or lightheadedness with hypotension: No Has patient had a PCN reaction causing severe rash involving mucus membranes or skin necrosis: No Has patient had a PCN reaction that required hospitalization No Has patient had a PCN reaction occurring within the last 10 years: Yes  If all of the above answers are "NO", then may proceed with Cephalosporin use.   . Buprenorphine Hcl Rash  . Encainide Nausea And Vomiting    Consultations: None   Procedures/Studies:  No results found.  None   Subjective: Feeling well and would like to go home today. Denies nausea or vomiting. Denies abdominal pain. Tolerated breakfast.  Discharge Exam: Vitals:   03/20/16 0319 03/20/16 0600  BP: (!) 107/59 (!) 98/54  Pulse: 91 86  Resp: 15 15  Temp: 99 F (37.2 C) 98.4 F (36.9 C)   Vitals:   03/19/16 1200 03/19/16 2241 03/20/16 0319 03/20/16 0600  BP: (!) 103/46 133/71 (!) 107/59 (!) 98/54  Pulse: 86 95 91 86  Resp: 14 16 15 15   Temp: 98.6 F (37 C) 98.3 F (36.8 C) 99 F (37.2 C) 98.4 F (36.9 C)  TempSrc: Oral Oral Oral Oral  SpO2: 99% 98% 99% 99%  Weight: 64.8 kg (142 lb 13.7 oz)     Height: 5\' 2"  (1.575 m)       General exam:  Adult Female.  No acute distress. Sitting at edge of bed completing her breakfast. Smiling, well-appearing HEENT:  NCAT, MMM Respiratory system: Clear to auscultation bilaterally Cardiovascular system: Regular rate and rhythm, normal S1/S2. No murmurs, rubs, gallops or clicks.  Warm extremities Gastrointestinal system: Normal active bowel sounds, soft, nondistended, nontender. MSK:  Normal tone and  bulk, no lower extremity edema   The results of significant diagnostics from this hospitalization (including imaging, microbiology, ancillary and laboratory) are listed below for reference.     Microbiology: Recent Results (from the past 240 hour(s))  MRSA PCR Screening     Status: None   Collection Time: 03/18/16  3:58 PM  Result Value Ref Range Status   MRSA by PCR NEGATIVE NEGATIVE Final    Comment:        The GeneXpert MRSA Assay (FDA approved for NASAL specimens only), is one component of a comprehensive MRSA colonization surveillance program. It is not intended to diagnose MRSA infection nor to guide or monitor treatment for MRSA infections.      Labs: BNP (last 3 results) No results for input(s): BNP in the last 8760 hours. Basic Metabolic Panel:  Recent Labs Lab 03/19/16 0011 03/19/16 0337 03/19/16 0759 03/19/16 1537 03/20/16 0431  NA 143 140 139 134* 138  K 4.3 4.3 4.1 4.0 4.0  CL 117* 115* 111 108 112*  CO2 17* 19* 20* 19* 20*  GLUCOSE 143* 136* 174* 240* 60*  BUN 20 20 20 18  21*  CREATININE 1.35* 1.30* 1.24* 1.18* 1.08*  CALCIUM 9.0 9.0 9.2 9.0 8.9   Liver Function Tests:  Recent Labs Lab 03/17/16 1301  AST 30  ALT 16  ALKPHOS 98  BILITOT 0.8  PROT 9.1*  ALBUMIN 4.6    Recent Labs Lab 03/17/16 1301  LIPASE 45   No results for input(s): AMMONIA in the last 168 hours. CBC:  Recent Labs Lab 03/17/16 1301 03/18/16 1023  WBC 6.5 13.2*  NEUTROABS 4.7 12.1*  HGB 10.9* 10.8*  HCT 36.3 37.2  MCV 71.6* 74.8*  PLT 467* 487*   Cardiac Enzymes: No results for input(s): CKTOTAL, CKMB, CKMBINDEX, TROPONINI in the last 168 hours. BNP: Invalid input(s): POCBNP CBG:  Recent Labs Lab 03/19/16 2242 03/20/16 0553 03/20/16 0627 03/20/16 0753 03/20/16 1214  GLUCAP 266* 54* 80 154* 172*   D-Dimer No results for input(s): DDIMER in the last 72 hours. Hgb A1c  Recent Labs  03/19/16 0011  HGBA1C 10.3*   Lipid Profile No results  for input(s): CHOL, HDL, LDLCALC, TRIG, CHOLHDL, LDLDIRECT in the last 72 hours. Thyroid function studies No results for input(s): TSH, T4TOTAL,  T3FREE, THYROIDAB in the last 72 hours.  Invalid input(s): FREET3 Anemia work up No results for input(s): VITAMINB12, FOLATE, FERRITIN, TIBC, IRON, RETICCTPCT in the last 72 hours. Urinalysis    Component Value Date/Time   COLORURINE YELLOW 03/18/2016 0805   APPEARANCEUR CLEAR 03/18/2016 0805   LABSPEC 1.024 03/18/2016 0805   PHURINE 5.0 03/18/2016 0805   GLUCOSEU >1000 (A) 03/18/2016 0805   HGBUR TRACE (A) 03/18/2016 0805   BILIRUBINUR NEGATIVE 03/18/2016 0805   BILIRUBINUR NEGATIVE 08/04/2012 1005   KETONESUR >80 (A) 03/18/2016 0805   PROTEINUR 100 (A) 03/18/2016 0805   UROBILINOGEN 0.2 04/27/2015 1608   NITRITE NEGATIVE 03/18/2016 0805   LEUKOCYTESUR NEGATIVE 03/18/2016 0805   Sepsis Labs Invalid input(s): PROCALCITONIN,  WBC,  LACTICIDVEN   Time coordinating discharge: Over 30 minutes  SIGNED:   Janece Canterbury, MD  [facility] 03/20/2016, 1:00 PM Pager   If 7PM-7AM, please contact night-coverage [email or web address] Password [email or web address]

## 2016-03-20 NOTE — Progress Notes (Signed)
Spoke with patient at bedside. Plans to d/c home today with family. Scheduled f/u appt with [facility], informed patient of date and time, also documented in AVS. Patient appreciative of assistance, meds are to be sent to [facility] pharmacy for patient to pick up. No further d/c needs identified. Patient anxious for d/c so she can make transportation arrangements.

## 2016-03-20 NOTE — Progress Notes (Signed)
Inpatient Diabetes Program Recommendations  AACE/ADA: New Consensus Statement on Inpatient Glycemic Control (2015)  Target Ranges:  Prepandial:   less than 140 mg/dL      Peak postprandial:   less than 180 mg/dL (1-2 hours)      Critically ill patients:  140 - 180 mg/dL   Results for [name], [name] (MRN [id number]) as of 03/20/2016 09:55  Ref. Range 03/19/2016 12:17 03/19/2016 18:05 03/19/2016 22:42  Glucose-Capillary Latest Ref Range: 65 - 99 mg/dL 93 260 (H) 266 (H)   Results for [name], [name] (MRN [id number]) as of 03/20/2016 09:55  Ref. Range 03/20/2016 05:53 03/20/2016 06:27 03/20/2016 07:53  Glucose-Capillary Latest Ref Range: 65 - 99 mg/dL 54 (L) 80 154 (H)     Home DM Meds: Levemir 30 units QHS                             Novolog 0-15 units tid per SSI  Current Insulin Orders: Levemir 15 units QHS      Novolog Sensitive Correction Scale/ SSI (0-9 units) TID AC      Novolog 3 units tidwc      -Note patient with Hyperglycemia yesterday afternoon and Hypoglycemia this AM.  -Note Levemir reduced today (will get decreased dose tonight) and low dose Novolog Meal Coverage started this morning.    -Agree with Insulin adjustments.      --Will follow patient during hospitalization--  Wyn Quaker RN, MSN, CDE Diabetes Coordinator Inpatient Glycemic Control Team Team Pager: [phone] (8a-5p)

## 2016-03-20 NOTE — Progress Notes (Signed)
Hypoglycemic Event  CBG: 54 at 0553  Treatment: Catheryn Bacon  Symptoms:None  Follow-up CBG: [id number] CBG Result:80  Possible Reasons for Event: Did not eat much overnight Comments/MD notified:n/a hypoglycemia protocol followed.    Azzie Glatter [place]

## 2016-03-21 ENCOUNTER — Encounter (HOSPITAL_COMMUNITY): Payer: Self-pay | Admitting: *Deleted

## 2016-03-21 ENCOUNTER — Emergency Department (HOSPITAL_COMMUNITY)
Admission: EM | Admit: 2016-03-21 | Discharge: 2016-03-22 | Disposition: A | Discharge status: Home / Self Care | Payer: Self-pay

## 2016-03-21 DIAGNOSIS — R1084 Generalized abdominal pain: Secondary | ICD-10-CM

## 2016-03-21 DIAGNOSIS — Z79899 Other long term (current) drug therapy: Secondary | ICD-10-CM | POA: Insufficient documentation

## 2016-03-21 DIAGNOSIS — R112 Nausea with vomiting, unspecified: Secondary | ICD-10-CM | POA: Insufficient documentation

## 2016-03-21 DIAGNOSIS — Z794 Long term (current) use of insulin: Secondary | ICD-10-CM | POA: Insufficient documentation

## 2016-03-21 DIAGNOSIS — E1165 Type 2 diabetes mellitus with hyperglycemia: Secondary | ICD-10-CM | POA: Insufficient documentation

## 2016-03-21 DIAGNOSIS — K3184 Gastroparesis: Secondary | ICD-10-CM

## 2016-03-21 DIAGNOSIS — I10 Essential (primary) hypertension: Secondary | ICD-10-CM | POA: Insufficient documentation

## 2016-03-21 DIAGNOSIS — E1143 Type 2 diabetes mellitus with diabetic autonomic (poly)neuropathy: Secondary | ICD-10-CM | POA: Insufficient documentation

## 2016-03-21 DIAGNOSIS — D649 Anemia, unspecified: Secondary | ICD-10-CM | POA: Insufficient documentation

## 2016-03-21 LAB — CBC WITH DIFFERENTIAL/PLATELET
BASOS ABS: 0 10*3/uL (ref 0.0–0.1)
BASOS PCT: 1 %
Eosinophils Absolute: 0.1 10*3/uL (ref 0.0–0.7)
Eosinophils Relative: 1 %
HEMATOCRIT: 30.9 % — AB (ref 36.0–46.0)
HEMOGLOBIN: 9.3 g/dL — AB (ref 12.0–15.0)
LYMPHS PCT: 17 %
Lymphs Abs: 0.9 10*3/uL (ref 0.7–4.0)
MCH: 21.3 pg — ABNORMAL LOW (ref 26.0–34.0)
MCHC: 30.1 g/dL (ref 30.0–36.0)
MCV: 70.7 fL — ABNORMAL LOW (ref 78.0–100.0)
MONO ABS: 0.4 10*3/uL (ref 0.1–1.0)
MONOS PCT: 7 %
NEUTROS ABS: 4.1 10*3/uL (ref 1.7–7.7)
NEUTROS PCT: 75 %
Platelets: 369 10*3/uL (ref 150–400)
RBC: 4.37 MIL/uL (ref 3.87–5.11)
RDW: 15.9 % — ABNORMAL HIGH (ref 11.5–15.5)
WBC: 5.5 10*3/uL (ref 4.0–10.5)

## 2016-03-21 LAB — COMPREHENSIVE METABOLIC PANEL
ALBUMIN: 3.7 g/dL (ref 3.5–5.0)
ALT: 17 U/L (ref 14–54)
ANION GAP: 9 (ref 5–15)
AST: 25 U/L (ref 15–41)
Alkaline Phosphatase: 74 U/L (ref 38–126)
BILIRUBIN TOTAL: 1.4 mg/dL — AB (ref 0.3–1.2)
BUN: 17 mg/dL (ref 6–20)
CHLORIDE: 104 mmol/L (ref 101–111)
CO2: 21 mmol/L — ABNORMAL LOW (ref 22–32)
Calcium: 9 mg/dL (ref 8.9–10.3)
Creatinine, Ser: 0.98 mg/dL (ref 0.44–1.00)
GFR calc Af Amer: >60 mL/min (ref >60)
GFR calc non Af Amer: >60 mL/min (ref >60)
GLUCOSE: 383 mg/dL — AB (ref 65–99)
POTASSIUM: 4.7 mmol/L (ref 3.5–5.1)
SODIUM: 134 mmol/L — AB (ref 135–145)
TOTAL PROTEIN: 7.5 g/dL (ref 6.5–8.1)

## 2016-03-21 LAB — CBG MONITORING, ED: GLUCOSE-CAPILLARY: 285 mg/dL — AB (ref 65–99)

## 2016-03-21 LAB — LIPASE, BLOOD: Lipase: 31 U/L (ref 11–51)

## 2016-03-21 MED ORDER — ONDANSETRON 4 MG PO TBDP
4.0000 mg | ORAL_TABLET | Freq: Once | ORAL | Status: AC
  Administered 2016-03-21: 4 mg via ORAL
  Filled 2016-03-21: qty 1

## 2016-03-21 MED ORDER — LORAZEPAM 2 MG/ML IJ SOLN
1.0000 mg | Freq: Once | INTRAMUSCULAR | Status: DC
  Filled 2016-03-21: qty 1

## 2016-03-21 MED ORDER — LORAZEPAM 2 MG/ML IJ SOLN
1.0000 mg | Freq: Once | INTRAMUSCULAR | Status: AC
  Administered 2016-03-21: 1 mg via INTRAMUSCULAR

## 2016-03-21 MED ORDER — HALOPERIDOL LACTATE 5 MG/ML IJ SOLN
2.0000 mg | Freq: Once | INTRAMUSCULAR | Status: AC
  Administered 2016-03-21: 2 mg via INTRAVENOUS
  Filled 2016-03-21: qty 1

## 2016-03-21 MED ORDER — SODIUM CHLORIDE 0.9 % IV BOLUS (SEPSIS)
1000.0000 mL | Freq: Once | INTRAVENOUS | Status: AC
  Administered 2016-03-21: 1000 mL via INTRAVENOUS

## 2016-03-21 MED ORDER — ONDANSETRON HCL 4 MG/2ML IJ SOLN
4.0000 mg | Freq: Once | INTRAMUSCULAR | Status: DC
  Filled 2016-03-21: qty 2

## 2016-03-21 NOTE — ED Provider Notes (Signed)
[facility] DEPT Provider Note   CSN: [id number] Arrival date & time: 03/21/16  1807     History   Chief Complaint Chief Complaint  Patient presents with  . Emesis    HPI [name] is a 50 y.o. female.  [name] is a 50 y.o. Female who presents to the emergency room today complaining of her diabetic gastroparesis. Patient with a long history of diabetic gastroparesis and reports she feels that it has been exacerbated again today. Patient was admitted and discharged yesterday for DKA and gastroparesis. She reports she was feeling better and then this morning she had return of symptoms. She complains of generalized abdominal pain nausea and vomiting. No diarrhea. She has attempted to take her Zofran and Reglan but has been vomiting everything back up. She also complains of a panic attack because of her gastroparesis. She feels very anxious. She has been taking her insulin as prescribed. She denies any previous abdominal surgeries. Patient denies fevers, urinary symptoms, hematemesis, hematochezia, chest pain.   The history is provided by the patient and medical records. No language interpreter was used.  Emesis   Associated symptoms include abdominal pain. Pertinent negatives include no chills, no cough, no diarrhea, no fever and no headaches.    Past Medical History:  Diagnosis Date  . Depression   . Diabetes mellitus without complication ([place])   . Essential hypertension   . Gastroparesis   . GERD (gastroesophageal reflux disease)   . HLD (hyperlipidemia)     Patient Active Problem List   Diagnosis Date Noted  . DKA (diabetic ketoacidoses) ([place]) 03/18/2016  . Noncompliance with treatment plan 03/13/2016  . Essential hypertension 01/24/2016  . Leukocytosis 01/24/2016  . Prolonged QT interval 11/27/2015  . Abnormal ECG 11/27/2015  . HLD (hyperlipidemia)   . GERD (gastroesophageal reflux disease)   . Metabolic acidosis, normal anion gap (NAG)   . AKI (acute  kidney injury) ([place])   . Anemia, iron deficiency   . DKA, type 1, not at goal [facility])   . Hyponatremia 09/02/2015  . Vitamin B12 deficiency 08/16/2015  . Lactic acidosis 07/29/2015  . Acute pyelonephritis 07/29/2015  . Pyelonephritis   . Hypothermia   . Diabetic gastroparesis ([place])   . Diabetic neuropathy, type I diabetes mellitus ([place]) 05/18/2015  . Anemia 05/18/2015  . Anxiety and depression 05/18/2015  . Nausea & vomiting   . Diabetic ketoacidosis without coma associated with type 1 diabetes mellitus ([place])   . High anion gap metabolic acidosis [id number]    Past Surgical History:  Procedure Laterality Date  . COLONOSCOPY  09/27/2014   at [facility]  . ESOPHAGOGASTRODUODENOSCOPY  09/27/2014   at [facility], Dr Rolan Lipa. biospy neg for celiac, neg for H pylori.   . EYE SURGERY    . gailstones    . IR GENERIC HISTORICAL  01/24/2016   IR FLUORO GUIDE CV LINE RIGHT 01/24/2016 Darrell K Allred, PA-C [facility]  . IR GENERIC HISTORICAL  01/24/2016   IR US GUIDE VASC ACCESS RIGHT 01/24/2016 Darrell K Allred, PA-C [facility]  . POSTERIOR VITRECTOMY AND MEMBRANE PEEL-LEFT EYE  09/28/2002  . POSTERIOR VITRECTOMY AND MEMBRANE PEEL-RIGHT EYE  03/16/2002  . RETINAL DETACHMENT SURGERY      OB History    No data available       Home Medications    Prior to Admission medications   Medication Sig Start Date End Date Taking? Authorizing Provider  albuterol (PROVENTIL HFA;VENTOLIN HFA) 108 (90 Base) MCG/ACT inhaler Inhale 2  puffs into the lungs every 6 (six) hours as needed for wheezing or shortness of breath. 10/20/15   [name], MD  Blood Glucose Monitoring Suppl (TRUE METRIX METER) DEVI 1 each by Does not apply route 3 (three) times daily before meals. 07/19/15   [name], MD  busPIRone (BUSPAR) 5 MG tablet Take 1 tablet (5 mg total) by mouth 3 (three) times daily. 01/31/16   [name], MD  erythromycin (E-MYCIN) 250 MG tablet Take 1 tablet (250 mg total) by mouth 3 (three) times daily  with meals as needed. 03/20/16   Janece Canterbury, MD  furosemide (LASIX) 40 MG tablet Take 40 mg by mouth daily as needed for fluid.     Historical Provider, MD  glucose blood (TRUE METRIX BLOOD GLUCOSE TEST) test strip Use 3 times daily before meals 03/13/16   [name], MD  insulin aspart (NOVOLOG) 100 UNIT/ML injection Inject 0-15 Units into the skin 3 (three) times daily with meals. Per sliding scale 01/31/16   [name], MD  insulin detemir (LEVEMIR) 100 UNIT/ML injection Inject 0.25 mLs (25 Units total) into the skin at bedtime. 03/20/16   Janece Canterbury, MD  Insulin Syringe-Needle U-100 (BD INSULIN SYRINGE ULTRAFINE) 31G X 15/64" 0.5 ML MISC 1 each by Does not apply route 4 (four) times daily as needed. 08/16/15   [name], MD  ondansetron (ZOFRAN ODT) 4 MG disintegrating tablet Take 1 tablet (4 mg total) by mouth every 8 (eight) hours as needed for nausea. 01/31/16   [name], MD  polyethylene glycol (MIRALAX / GLYCOLAX) packet Take 17 g by mouth daily as needed for mild constipation. 03/20/16   Janece Canterbury, MD  pregabalin (LYRICA) 150 MG capsule Take 1 capsule (150 mg total) by mouth 2 (two) times daily. 03/13/16   [name], MD    Family History Family History  Problem Relation Age of Onset  . Cystic fibrosis Mother   . Hypertension Father   . Diabetes Brother   . Hypertension Maternal Grandmother     Social History Social History  Substance Use Topics  . Smoking status: Never Smoker  . Smokeless tobacco: Never Used  . Alcohol use No     Allergies   Anesthetics, amide; Penicillins; Buprenorphine hcl; and Encainide   Review of Systems Review of Systems  Constitutional: Negative for chills and fever.  HENT: Negative for congestion and sore throat.   Eyes: Negative for visual disturbance.  Respiratory: Negative for cough, shortness of breath and wheezing.   Cardiovascular: Negative for chest pain.  Gastrointestinal: Positive for abdominal pain, nausea and  vomiting. Negative for blood in stool and diarrhea.  Genitourinary: Negative for difficulty urinating, dysuria, frequency and hematuria.  Musculoskeletal: Negative for back pain and neck pain.  Skin: Negative for rash.  Neurological: Negative for headaches.  Psychiatric/Behavioral: The patient is nervous/anxious.      Physical Exam Updated Vital Signs BP 174/77 (BP Location: Right Arm)   Pulse 107   Temp 99.1 F (37.3 C) (Oral)   Resp 18   Ht 5\' 2"  (1.575 m)   Wt 65.3 kg   LMP 02/15/2016   SpO2 100%   BMI 26.34 kg/m   Physical Exam  Constitutional: She is oriented to person, place, and time. She appears well-developed and well-nourished. No distress.  HENT:  Head: Normocephalic and atraumatic.  Mouth/Throat: Oropharynx is clear and moist.  Eyes: Conjunctivae are normal. Pupils are equal, round, and reactive to light. Right eye exhibits no discharge. Left eye exhibits  no discharge.  Neck: Neck supple.  Cardiovascular: Normal rate, regular rhythm, normal heart sounds and intact distal pulses.  Exam reveals no gallop and no friction rub.   No murmur heard. Pulmonary/Chest: Effort normal and breath sounds normal. No respiratory distress. She has no wheezes. She has no rales.  Abdominal: Soft. Bowel sounds are normal. She exhibits no distension and no mass. There is tenderness. There is no rebound and no guarding.  Abdomen is soft. Bowel sounds are present. Patient has mild generalized abdominal tenderness to palpation without focal tenderness. She is retching.  Musculoskeletal: She exhibits no edema.  Lymphadenopathy:    She has no cervical adenopathy.  Neurological: She is alert and oriented to person, place, and time. Coordination normal.  Skin: Skin is warm and dry. No rash noted. She is not diaphoretic. No erythema. No pallor.  Psychiatric: Her behavior is normal. Her mood appears anxious.  Patient appears slightly anxious.  Nursing note and vitals reviewed.    ED  Treatments / Results  Labs (all labs ordered are listed, but only abnormal results are displayed) Labs Reviewed  CBG MONITORING, ED - Abnormal; Notable for the following:       Result Value   Glucose-Capillary 285 (*)    All other components within normal limits  COMPREHENSIVE METABOLIC PANEL  LIPASE, BLOOD  CBC WITH DIFFERENTIAL/PLATELET  URINALYSIS, ROUTINE W REFLEX MICROSCOPIC (NOT AT [facility])  POC URINE PREG, ED    EKG  EKG Interpretation None       Radiology No results found.  Procedures Procedures (including critical care time)  Medications Ordered in ED Medications  sodium chloride 0.9 % bolus 1,000 mL (not administered)  ondansetron (ZOFRAN-ODT) disintegrating tablet 4 mg (4 mg Oral Given 03/21/16 2013)  LORazepam (ATIVAN) injection 1 mg (1 mg Intramuscular Given 03/21/16 2015)     Initial Impression / Assessment and Plan / ED Course  I have reviewed the triage vital signs and the nursing notes.  Pertinent labs & imaging results that were available during my care of the patient were reviewed by me and considered in my medical decision making (see chart for details).  Clinical Course   Patient presents to the emergency department complaining of diabetic gastroparesis. She's had nausea and vomiting starting today. She claims of abdominal cramping. She is a long history of diabetic gastroparesis. She is unable to keep down her nausea medications. On exam the patient is afebrile and nontoxic-appearing. Fingerstick glucose is 285. She is generalized abdominal tenderness without focal tenderness. Patient reports her symptoms consistent with her gastroparesis. At shift change patient is awaiting IV access and blood draw. She is received Zofran ODT and IM Ativan with some relief. She reports she is beginning to feel better. Still awaiting blood work. IV team is working on access. At shift change patient care is handed off to [employer], [employer]. She will disposition the  patient. Plan is likely for discharge of blood work is unremarkable and patient's symptoms improved. Patient does have a history of QT prolongation and her chart. Her recent EKG showed no QT prolongation.  Final Clinical Impressions(s) / ED Diagnoses   Final diagnoses:  Diabetic gastroparesis ([place])  Non-intractable vomiting with nausea, vomiting of unspecified type    New Prescriptions New Prescriptions   No medications on file     Waynetta Pean, Hershal Coria 03/21/16 2107    Sherwood Gambler, MD 03/26/16 1730

## 2016-03-21 NOTE — ED Triage Notes (Signed)
Patient presents from home with N/V, abdominal cramping and CBG of 285 in triage.  Patient is a type 1 diabetic and was discharged from hospital yesterday following hospitalization for DKA without coma and gastroparesis.  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

## 2016-03-21 NOTE — ED Provider Notes (Signed)
Care assumed from [name], [place], at shift change. Pt here with n/v/abd cramping similar to prior gastroparesis episodes, feeling some better after ativan and zofran given here. Results so far show gluc 285. Awaiting lipase, CMP, CBC w/diff, U/A, and Upreg. Plan is to reassess after labs done, and see if symptoms are improved. May consider haldol for n/v control if needed.   Physical Exam  BP 174/77 (BP Location: Right Arm)   Pulse 107   Temp 99.1 F (37.3 C) (Oral)   Resp 18   Ht 5\' 2"  (1.575 m)   Wt 65.3 kg   LMP 02/15/2016   SpO2 100%   BMI 26.34 kg/m   Physical Exam Gen: afebrile, VSS, NAD, eating ice chips HEENT: EOMI, MMM Resp: no resp distress CV: mildly tachycardic Abd: appearance normal, Soft, nondistended, +BS throughout, with very mild TTP diffusely throughout but no focal TTP, no r/g/r, neg murphy's, neg mcburney's, no CVA TTP MsK: moving all extremities with ease Neuro: A&O x4  ED Course  Procedures Results for orders placed or performed during the hospital encounter of 03/21/16  Comprehensive metabolic panel  Result Value Ref Range   Sodium 134 (L) 135 - 145 mmol/L   Potassium 4.7 3.5 - 5.1 mmol/L   Chloride 104 101 - 111 mmol/L   CO2 21 (L) 22 - 32 mmol/L   Glucose, Bld 383 (H) 65 - 99 mg/dL   BUN 17 6 - 20 mg/dL   Creatinine, Ser 0.98 0.44 - 1.00 mg/dL   Calcium 9.0 8.9 - 10.3 mg/dL   Total Protein 7.5 6.5 - 8.1 g/dL   Albumin 3.7 3.5 - 5.0 g/dL   AST 25 15 - 41 U/L   ALT 17 14 - 54 U/L   Alkaline Phosphatase 74 38 - 126 U/L   Total Bilirubin 1.4 (H) 0.3 - 1.2 mg/dL   GFR calc non Af Amer >60 >60 mL/min   GFR calc Af Amer >60 >60 mL/min   Anion gap 9 5 - 15  Lipase, blood  Result Value Ref Range   Lipase 31 11 - 51 U/L  CBC with Differential  Result Value Ref Range   WBC 5.5 4.0 - 10.5 K/uL   RBC 4.37 3.87 - 5.11 MIL/uL   Hemoglobin 9.3 (L) 12.0 - 15.0 g/dL   HCT 30.9 (L) 36.0 - 46.0 %   MCV 70.7 (L) 78.0 - 100.0 fL   MCH 21.3 (L) 26.0 - 34.0  pg   MCHC 30.1 30.0 - 36.0 g/dL   RDW 15.9 (H) 11.5 - 15.5 %   Platelets 369 150 - 400 K/uL   Neutrophils Relative % 75 %   Neutro Abs 4.1 1.7 - 7.7 K/uL   Lymphocytes Relative 17 %   Lymphs Abs 0.9 0.7 - 4.0 K/uL   Monocytes Relative 7 %   Monocytes Absolute 0.4 0.1 - 1.0 K/uL   Eosinophils Relative 1 %   Eosinophils Absolute 0.1 0.0 - 0.7 K/uL   Basophils Relative 1 %   Basophils Absolute 0.0 0.0 - 0.1 K/uL   Smear Review MORPHOLOGY UNREMARKABLE   CBG monitoring, ED  Result Value Ref Range   Glucose-Capillary 285 (H) 65 - 99 mg/dL   No results found.   Meds ordered this encounter  Medications  . sodium chloride 0.9 % bolus 1,000 mL  . DISCONTD: LORazepam (ATIVAN) injection 1 mg  . DISCONTD: ondansetron (ZOFRAN) injection 4 mg  . ondansetron (ZOFRAN-ODT) disintegrating tablet 4 mg  . LORazepam (ATIVAN) injection  1 mg  . haloperidol lactate (HALDOL) injection 2 mg     MDM:   ICD-9-CM ICD-10-CM   1. Diabetic gastroparesis ([place]) 250.60 E11.43    536.3 K31.84   2. Non-intractable vomiting with nausea, vomiting of unspecified type 787.01 R11.2   3. Generalized abdominal pain 789.07 R10.84   4. Chronic anemia 285.9 D64.9   5. Type 2 diabetes mellitus with hyperglycemia, with long-term current use of insulin ([facility]) 250.00 E11.65    790.29 Z79.4    V58.67      9:50 PM Pt feeling slightly better but requesting something else for nausea, despite currently eating ice chips and tolerating this well. Yvette give haldol 2mg  now. Fluids running. CBC w/diff showing mild stable anemia. Remainder of labs (lipase, CMP, U/A, and Upreg) pending. Yvette reassess after labs finish, and see how she's feeling at that time.   12:46 AM CMP with gluc 383 but no anion gap and bicarb unremarkable, bili 1.4 likely from mild dehydration. Lipase WNL. U/A and Upreg not yet done, Yvette attempt to get collected now. Pt feeling somewhat better after haldol, Yvette PO challenge now. Yvette reassess after U/A and  Upreg and PO challenge. Likely home if able to tolerate PO, has rx for zofran and reglan at home.   1:25 AM UA and Upreg pending, hasn't tried PO challenge yet. Patient care to be resumed by Delos Haring, PA-C at shift change sign-out. If U/A and Upreg unremarkable, and tolerates PO well, she can go home with PCP f/up. Patient history has been discussed with midlevel resuming care. Please see their notes for further documentation of pending results and dispo/care. Pt stable at sign-out and updated on transfer of care.     Fernando Torry Camprubi-Soms, PA-C 03/22/16 0125    Sherwood Gambler, MD 03/26/16 1730

## 2016-03-21 NOTE — ED Notes (Signed)
2 ultrasound IV attempts

## 2016-03-22 ENCOUNTER — Encounter (HOSPITAL_COMMUNITY): Payer: Self-pay | Admitting: *Deleted

## 2016-03-22 ENCOUNTER — Emergency Department (HOSPITAL_COMMUNITY): Payer: Self-pay

## 2016-03-22 ENCOUNTER — Inpatient Hospital Stay (HOSPITAL_COMMUNITY)
Admission: EM | Admit: 2016-03-22 | Discharge: 2016-03-26 | DRG: 638 | Disposition: A | Discharge status: Home / Self Care | Payer: Self-pay | Attending: Internal Medicine | Admitting: Internal Medicine

## 2016-03-22 DIAGNOSIS — D7589 Other specified diseases of blood and blood-forming organs: Secondary | ICD-10-CM | POA: Diagnosis present

## 2016-03-22 DIAGNOSIS — B373 Candidiasis of vulva and vagina: Secondary | ICD-10-CM | POA: Diagnosis present

## 2016-03-22 DIAGNOSIS — E1043 Type 1 diabetes mellitus with diabetic autonomic (poly)neuropathy: Secondary | ICD-10-CM | POA: Diagnosis present

## 2016-03-22 DIAGNOSIS — Z79899 Other long term (current) drug therapy: Secondary | ICD-10-CM

## 2016-03-22 DIAGNOSIS — E1069 Type 1 diabetes mellitus with other specified complication: Secondary | ICD-10-CM | POA: Diagnosis present

## 2016-03-22 DIAGNOSIS — I10 Essential (primary) hypertension: Secondary | ICD-10-CM | POA: Diagnosis present

## 2016-03-22 DIAGNOSIS — Z8249 Family history of ischemic heart disease and other diseases of the circulatory system: Secondary | ICD-10-CM

## 2016-03-22 DIAGNOSIS — Z833 Family history of diabetes mellitus: Secondary | ICD-10-CM

## 2016-03-22 DIAGNOSIS — Z880 Allergy status to penicillin: Secondary | ICD-10-CM

## 2016-03-22 DIAGNOSIS — E1010 Type 1 diabetes mellitus with ketoacidosis without coma: Principal | ICD-10-CM | POA: Diagnosis present

## 2016-03-22 DIAGNOSIS — F419 Anxiety disorder, unspecified: Secondary | ICD-10-CM | POA: Diagnosis present

## 2016-03-22 DIAGNOSIS — Z888 Allergy status to other drugs, medicaments and biological substances status: Secondary | ICD-10-CM

## 2016-03-22 DIAGNOSIS — Z794 Long term (current) use of insulin: Secondary | ICD-10-CM

## 2016-03-22 DIAGNOSIS — Z599 Problem related to housing and economic circumstances, unspecified: Secondary | ICD-10-CM

## 2016-03-22 DIAGNOSIS — K219 Gastro-esophageal reflux disease without esophagitis: Secondary | ICD-10-CM | POA: Diagnosis present

## 2016-03-22 DIAGNOSIS — E785 Hyperlipidemia, unspecified: Secondary | ICD-10-CM | POA: Diagnosis present

## 2016-03-22 DIAGNOSIS — F32A Depression, unspecified: Secondary | ICD-10-CM | POA: Diagnosis present

## 2016-03-22 DIAGNOSIS — Z885 Allergy status to narcotic agent status: Secondary | ICD-10-CM

## 2016-03-22 DIAGNOSIS — F329 Major depressive disorder, single episode, unspecified: Secondary | ICD-10-CM | POA: Diagnosis present

## 2016-03-22 DIAGNOSIS — N179 Acute kidney failure, unspecified: Secondary | ICD-10-CM | POA: Diagnosis present

## 2016-03-22 DIAGNOSIS — D72829 Elevated white blood cell count, unspecified: Secondary | ICD-10-CM | POA: Diagnosis present

## 2016-03-22 DIAGNOSIS — E1143 Type 2 diabetes mellitus with diabetic autonomic (poly)neuropathy: Secondary | ICD-10-CM | POA: Diagnosis present

## 2016-03-22 DIAGNOSIS — R7989 Other specified abnormal findings of blood chemistry: Secondary | ICD-10-CM | POA: Diagnosis present

## 2016-03-22 DIAGNOSIS — K3184 Gastroparesis: Secondary | ICD-10-CM | POA: Diagnosis present

## 2016-03-22 DIAGNOSIS — D509 Iron deficiency anemia, unspecified: Secondary | ICD-10-CM | POA: Diagnosis present

## 2016-03-22 DIAGNOSIS — R112 Nausea with vomiting, unspecified: Secondary | ICD-10-CM | POA: Diagnosis present

## 2016-03-22 DIAGNOSIS — Z884 Allergy status to anesthetic agent status: Secondary | ICD-10-CM

## 2016-03-22 LAB — URINALYSIS, ROUTINE W REFLEX MICROSCOPIC
Bilirubin Urine: NEGATIVE
Glucose, UA: >1000 mg/dL — AB
HGB URINE DIPSTICK: NEGATIVE
Ketones, ur: 40 mg/dL — AB
LEUKOCYTES UA: NEGATIVE
Nitrite: NEGATIVE
PROTEIN: NEGATIVE mg/dL
SPECIFIC GRAVITY, URINE: 1.022 (ref 1.005–1.030)
pH: 6 (ref 5.0–8.0)

## 2016-03-22 LAB — URINE MICROSCOPIC-ADD ON: RBC / HPF: NONE SEEN RBC/hpf (ref 0–5)

## 2016-03-22 LAB — PREGNANCY, URINE: Preg Test, Ur: NEGATIVE

## 2016-03-22 LAB — CBG MONITORING, ED
GLUCOSE-CAPILLARY: 526 mg/dL — AB (ref 65–99)
Glucose-Capillary: 445 mg/dL — ABNORMAL HIGH (ref 65–99)

## 2016-03-22 MED ORDER — SODIUM CHLORIDE 0.9 % IV SOLN
Freq: Once | INTRAVENOUS | Status: AC
  Administered 2016-03-23: via INTRAVENOUS

## 2016-03-22 MED ORDER — LORAZEPAM 2 MG/ML IJ SOLN
1.0000 mg | Freq: Once | INTRAMUSCULAR | Status: AC
  Administered 2016-03-22: 1 mg via INTRAMUSCULAR
  Filled 2016-03-22: qty 1

## 2016-03-22 MED ORDER — SODIUM CHLORIDE 0.9 % IV BOLUS (SEPSIS)
2000.0000 mL | Freq: Once | INTRAVENOUS | Status: AC
  Administered 2016-03-22: 2000 mL via INTRAVENOUS

## 2016-03-22 MED ORDER — HALOPERIDOL LACTATE 5 MG/ML IJ SOLN
2.0000 mg | Freq: Once | INTRAMUSCULAR | Status: AC
Start: 2016-03-22 — End: 2016-03-22
  Administered 2016-03-22: 2 mg via INTRAMUSCULAR
  Filled 2016-03-22: qty 1

## 2016-03-22 NOTE — ED Provider Notes (Signed)
[place] DEPT Provider Note   CSN: [id number] Arrival date & time: 03/22/16  1443  By signing my name below, I, [name], attest that this documentation has been prepared under the direction and in the presence of [name], MD. Electronically Signed: [name], ED Scribe. 03/22/16. 8:34 PM.  History   Chief Complaint Chief Complaint  Patient presents with  . Hyperglycemia   The history is provided by the patient and the EMS personnel. No language interpreter was used.   HPI Comments: [name] is a 50 y.o. female BIB EMS, with a PMHx of DM1, diabetes gastroparesis, and DKA, who presents to the Emergency Department complaining of intermittent episodes of nausea and vomiting onset ~10 hours ago. She reports associated diffuse abdominal pain secondary to her symtpoms. Pt has a hx of diabetic gastroparesis and notes that her symptoms today are similar. Pt was seen for same in the ED yesterday, and notes that her symptoms had mostly resolved upon d/c. At that time her lab work revealed no anion gap and her bicarbonate level was unremarkable. She was advised to take her rx'd slow acting insulin treatments at home, which she states that she has been taking with minimal relief of her symptoms. Pt has been admitted in the past for DKA, most recently on 03/18/16. Denies dysuria, hematuria, fever, difficulty urinating, or any other associated symptoms.  Past Medical History:  Diagnosis Date  . Depression   . Diabetes mellitus without complication ([place])   . Essential hypertension   . Gastroparesis   . GERD (gastroesophageal reflux disease)   . HLD (hyperlipidemia)    Patient Active Problem List   Diagnosis Date Noted  . DKA (diabetic ketoacidoses) ([place]) 03/18/2016  . Noncompliance with treatment plan 03/13/2016  . Essential hypertension 01/24/2016  . Leukocytosis 01/24/2016  . Prolonged QT interval 11/27/2015  . Abnormal ECG 11/27/2015  . HLD  (hyperlipidemia)   . GERD (gastroesophageal reflux disease)   . Metabolic acidosis, normal anion gap (NAG)   . AKI (acute kidney injury) ([place])   . Anemia, iron deficiency   . DKA, type 1, not at goal [facility])   . Hyponatremia 09/02/2015  . Vitamin B12 deficiency 08/16/2015  . Lactic acidosis 07/29/2015  . Acute pyelonephritis 07/29/2015  . Pyelonephritis   . Hypothermia   . Diabetic gastroparesis ([place])   . Diabetic neuropathy, type I diabetes mellitus ([place]) 05/18/2015  . Anemia 05/18/2015  . Anxiety and depression 05/18/2015  . Nausea & vomiting   . Diabetic ketoacidosis without coma associated with type 1 diabetes mellitus ([place])   . High anion gap metabolic acidosis [id number]   Past Surgical History:  Procedure Laterality Date  . COLONOSCOPY  09/27/2014   at [facility]  . ESOPHAGOGASTRODUODENOSCOPY  09/27/2014   at [facility], Dr Rolan Lipa. biospy neg for celiac, neg for H pylori.   . EYE SURGERY    . gailstones    . IR GENERIC HISTORICAL  01/24/2016   IR FLUORO GUIDE CV LINE RIGHT 01/24/2016 Darrell K Allred, PA-C [facility]-INTER[facility]  . IR GENERIC HISTORICAL  01/24/2016   IR US GUIDE VASC ACCESS RIGHT 01/24/2016 Darrell K Allred, PA-C [facility]INTER[facility]  . POSTERIOR VITRECTOMY AND MEMBRANE PEEL-LEFT EYE  09/28/2002  . POSTERIOR VITRECTOMY AND MEMBRANE PEEL-RIGHT EYE  03/16/2002  . RETINAL DETACHMENT SURGERY     OB History    No data available     Home Medications    Prior to Admission medications   Medication Sig Start Date  End Date Taking? Authorizing Provider  albuterol (PROVENTIL HFA;VENTOLIN HFA) 108 (90 Base) MCG/ACT inhaler Inhale 2 puffs into the lungs every 6 (six) hours as needed for wheezing or shortness of breath. 10/20/15   [name], MD  Blood Glucose Monitoring Suppl (TRUE METRIX METER) DEVI 1 each by Does not apply route 3 (three) times daily before meals. 07/19/15   [name], MD  busPIRone (BUSPAR) 5 MG tablet Take 1 tablet (5 mg total) by mouth 3 (three) times daily. 01/31/16    [name], MD  erythromycin (E-MYCIN) 250 MG tablet Take 1 tablet (250 mg total) by mouth 3 (three) times daily with meals as needed. 03/20/16   Janece Canterbury, MD  furosemide (LASIX) 40 MG tablet Take 40 mg by mouth daily as needed for fluid.     Historical Provider, MD  glucose blood (TRUE METRIX BLOOD GLUCOSE TEST) test strip Use 3 times daily before meals 03/13/16   [name], MD  insulin aspart (NOVOLOG) 100 UNIT/ML injection Inject 0-15 Units into the skin 3 (three) times daily with meals. Per sliding scale 01/31/16   [name], MD  insulin detemir (LEVEMIR) 100 UNIT/ML injection Inject 0.25 mLs (25 Units total) into the skin at bedtime. 03/20/16   Janece Canterbury, MD  Insulin Syringe-Needle U-100 (BD INSULIN SYRINGE ULTRAFINE) 31G X 15/64" 0.5 ML MISC 1 each by Does not apply route 4 (four) times daily as needed. 08/16/15   [name], MD  ondansetron (ZOFRAN ODT) 4 MG disintegrating tablet Take 1 tablet (4 mg total) by mouth every 8 (eight) hours as needed for nausea. 01/31/16   [name], MD  polyethylene glycol (MIRALAX / GLYCOLAX) packet Take 17 g by mouth daily as needed for mild constipation. 03/20/16   Janece Canterbury, MD  pregabalin (LYRICA) 150 MG capsule Take 1 capsule (150 mg total) by mouth 2 (two) times daily. 03/13/16   [name], MD   Family History Family History  Problem Relation Age of Onset  . Cystic fibrosis Mother   . Hypertension Father   . Diabetes Brother   . Hypertension Maternal Grandmother    Social History Social History  Substance Use Topics  . Smoking status: Never Smoker  . Smokeless tobacco: Never Used  . Alcohol use No   Allergies   Anesthetics, amide; Penicillins; Buprenorphine hcl; and Encainide  Review of Systems Review of Systems  Constitutional: Negative for fever.  Gastrointestinal: Positive for abdominal pain, nausea and vomiting.  Genitourinary: Negative for difficulty urinating, dysuria and hematuria.  All other systems  reviewed and are negative.  Physical Exam Updated Vital Signs BP (!) 155/53 (BP Location: Left Arm)   Pulse 118   Temp 99.3 F (37.4 C) (Oral)   Resp 20   LMP 02/15/2016   SpO2 100%   Physical Exam  Constitutional: She appears well-developed and well-nourished. She appears distressed.  Pt is anxious and spitting in emesis bag on exam.   HENT:  Head: Normocephalic and atraumatic.  Mouth/Throat: Oropharynx is clear and moist. No oropharyngeal exudate.  Eyes: Conjunctivae and EOM are normal. Pupils are equal, round, and reactive to light. Right eye exhibits no discharge. Left eye exhibits no discharge. No scleral icterus.  Neck: Normal range of motion. Neck supple. No JVD present. No thyromegaly present.  Cardiovascular: Normal heart sounds and intact distal pulses.  Exam reveals no gallop and no friction rub.   No murmur heard. tachycardic  Pulmonary/Chest: Effort normal and breath sounds normal. No respiratory distress. She has no  wheezes. She has no rales.  Abdominal: Soft. Bowel sounds are normal. She exhibits no distension and no mass. There is no tenderness.  Musculoskeletal: Normal range of motion. She exhibits no edema or tenderness.  Lymphadenopathy:    She has no cervical adenopathy.  Neurological: She is alert. Coordination normal.  Skin: Skin is warm and dry. No rash noted. No erythema.  Psychiatric: She has a normal mood and affect. Her behavior is normal.  Nursing note and vitals reviewed.  ED Treatments / Results  DIAGNOSTIC STUDIES: Oxygen Saturation is 100% on RA, normal by my interpretation.   COORDINATION OF CARE: 8:34 PM-Discussed next steps with pt. Pt verbalized understanding and is agreeable with the plan.   Labs (all labs ordered are listed, but only abnormal results are displayed) Labs Reviewed  CBC WITH DIFFERENTIAL/PLATELET - Abnormal; Notable for the following:       Result Value   WBC 12.6 (*)    Hemoglobin 8.4 (*)    HCT 29.0 (*)    MCV  73.4 (*)    MCH 21.3 (*)    MCHC 29.0 (*)    RDW 16.6 (*)    Platelets 523 (*)    Neutro Abs 10.4 (*)    Monocytes Absolute 1.1 (*)    All other components within normal limits  BASIC METABOLIC PANEL - Abnormal; Notable for the following:    Sodium 133 (*)    Potassium 5.4 (*)    CO2 <7 (*)    Glucose, Bld 581 (*)    BUN 24 (*)    Creatinine, Ser 1.74 (*)    GFR calc non Af Amer 34 (*)    GFR calc Af Amer 39 (*)    All other components within normal limits  URINALYSIS, ROUTINE W REFLEX MICROSCOPIC (NOT AT [facility]) - Abnormal; Notable for the following:    APPearance CLOUDY (*)    Glucose, UA >1000 (*)    Hgb urine dipstick MODERATE (*)    Ketones, ur >80 (*)    Protein, ur 30 (*)    Leukocytes, UA SMALL (*)    All other components within normal limits  BLOOD GAS, ARTERIAL - Abnormal; Notable for the following:    pH, Arterial 7.166 (*)    pO2, Arterial 120 (*)    All other components within normal limits  BASIC METABOLIC PANEL - Abnormal; Notable for the following:    CO2 <7 (*)    Glucose, Bld 471 (*)    BUN 25 (*)    Creatinine, Ser 1.59 (*)    Calcium 7.9 (*)    GFR calc non Af Amer 37 (*)    GFR calc Af Amer 43 (*)    All other components within normal limits  BASIC METABOLIC PANEL - Abnormal; Notable for the following:    Chloride 117 (*)    CO2 10 (*)    Glucose, Bld 170 (*)    BUN 24 (*)    Creatinine, Ser 1.30 (*)    Calcium 7.8 (*)    GFR calc non Af Amer 48 (*)    GFR calc Af Amer 55 (*)    All other components within normal limits  BASIC METABOLIC PANEL - Abnormal; Notable for the following:    Chloride 116 (*)    CO2 13 (*)    Glucose, Bld 107 (*)    BUN 22 (*)    Creatinine, Ser 1.22 (*)    Calcium 7.7 (*)    GFR calc  non Af Amer 52 (*)    GFR calc Af Amer 60 (*)    All other components within normal limits  BASIC METABOLIC PANEL - Abnormal; Notable for the following:    Chloride 114 (*)    CO2 12 (*)    Glucose, Bld 125 (*)    Creatinine, Ser  1.05 (*)    Calcium 7.8 (*)    All other components within normal limits  LACTIC ACID, PLASMA - Abnormal; Notable for the following:    Lactic Acid, Venous 3.0 (*)    All other components within normal limits  FERRITIN - Abnormal; Notable for the following:    Ferritin 10 (*)    All other components within normal limits  IRON AND TIBC - Abnormal; Notable for the following:    Iron <5 (*)    All other components within normal limits  GLUCOSE, CAPILLARY - Abnormal; Notable for the following:    Glucose-Capillary 156 (*)    All other components within normal limits  GLUCOSE, CAPILLARY - Abnormal; Notable for the following:    Glucose-Capillary 118 (*)    All other components within normal limits  URINE MICROSCOPIC-ADD ON - Abnormal; Notable for the following:    Squamous Epithelial / LPF 6-30 (*)    Bacteria, UA FEW (*)    All other components within normal limits  BASIC METABOLIC PANEL - Abnormal; Notable for the following:    Potassium 3.3 (*)    Chloride 114 (*)    CO2 19 (*)    Glucose, Bld 136 (*)    Creatinine, Ser 1.04 (*)    Calcium 7.8 (*)    Anion gap 4 (*)    All other components within normal limits  CBC - Abnormal; Notable for the following:    RBC 3.27 (*)    Hemoglobin 6.9 (*)    HCT 22.1 (*)    MCV 67.6 (*)    MCH 21.1 (*)    RDW 16.4 (*)    All other components within normal limits  GLUCOSE, CAPILLARY - Abnormal; Notable for the following:    Glucose-Capillary 433 (*)    All other components within normal limits  GLUCOSE, CAPILLARY - Abnormal; Notable for the following:    Glucose-Capillary 394 (*)    All other components within normal limits  GLUCOSE, CAPILLARY - Abnormal; Notable for the following:    Glucose-Capillary 290 (*)    All other components within normal limits  GLUCOSE, CAPILLARY - Abnormal; Notable for the following:    Glucose-Capillary 235 (*)    All other components within normal limits  GLUCOSE, CAPILLARY - Abnormal; Notable for the  following:    Glucose-Capillary 106 (*)    All other components within normal limits  GLUCOSE, CAPILLARY - Abnormal; Notable for the following:    Glucose-Capillary 102 (*)    All other components within normal limits  GLUCOSE, CAPILLARY - Abnormal; Notable for the following:    Glucose-Capillary 103 (*)    All other components within normal limits  GLUCOSE, CAPILLARY - Abnormal; Notable for the following:    Glucose-Capillary 102 (*)    All other components within normal limits  GLUCOSE, CAPILLARY - Abnormal; Notable for the following:    Glucose-Capillary 123 (*)    All other components within normal limits  GLUCOSE, CAPILLARY - Abnormal; Notable for the following:    Glucose-Capillary 145 (*)    All other components within normal limits  GLUCOSE, CAPILLARY - Abnormal; Notable for the following:  Glucose-Capillary 158 (*)    All other components within normal limits  GLUCOSE, CAPILLARY - Abnormal; Notable for the following:    Glucose-Capillary 154 (*)    All other components within normal limits  GLUCOSE, CAPILLARY - Abnormal; Notable for the following:    Glucose-Capillary 155 (*)    All other components within normal limits  GLUCOSE, CAPILLARY - Abnormal; Notable for the following:    Glucose-Capillary 185 (*)    All other components within normal limits  GLUCOSE, CAPILLARY - Abnormal; Notable for the following:    Glucose-Capillary 448 (*)    All other components within normal limits  GLUCOSE, CAPILLARY - Abnormal; Notable for the following:    Glucose-Capillary 374 (*)    All other components within normal limits  CBC - Abnormal; Notable for the following:    RBC 3.66 (*)    Hemoglobin 8.3 (*)    HCT 26.3 (*)    MCV 71.9 (*)    MCH 22.7 (*)    RDW 18.5 (*)    All other components within normal limits  BASIC METABOLIC PANEL - Abnormal; Notable for the following:    Chloride 113 (*)    CO2 19 (*)    Glucose, Bld 140 (*)    Creatinine, Ser 1.06 (*)    Calcium  8.1 (*)    All other components within normal limits  GLUCOSE, CAPILLARY - Abnormal; Notable for the following:    Glucose-Capillary 129 (*)    All other components within normal limits  GLUCOSE, CAPILLARY - Abnormal; Notable for the following:    Glucose-Capillary 176 (*)    All other components within normal limits  GLUCOSE, CAPILLARY - Abnormal; Notable for the following:    Glucose-Capillary 290 (*)    All other components within normal limits  BASIC METABOLIC PANEL - Abnormal; Notable for the following:    Glucose, Bld 156 (*)    Calcium 8.4 (*)    Anion gap 4 (*)    All other components within normal limits  GLUCOSE, CAPILLARY - Abnormal; Notable for the following:    Glucose-Capillary 344 (*)    All other components within normal limits  CBC - Abnormal; Notable for the following:    WBC 3.9 (*)    RBC 3.74 (*)    Hemoglobin 8.7 (*)    HCT 27.1 (*)    MCV 72.5 (*)    MCH 23.3 (*)    RDW 18.6 (*)    All other components within normal limits  GLUCOSE, CAPILLARY - Abnormal; Notable for the following:    Glucose-Capillary 137 (*)    All other components within normal limits  URINALYSIS, ROUTINE W REFLEX MICROSCOPIC (NOT AT [facility]) - Abnormal; Notable for the following:    Glucose, UA 250 (*)    Hgb urine dipstick TRACE (*)    All other components within normal limits  GLUCOSE, CAPILLARY - Abnormal; Notable for the following:    Glucose-Capillary 152 (*)    All other components within normal limits  GLUCOSE, CAPILLARY - Abnormal; Notable for the following:    Glucose-Capillary 191 (*)    All other components within normal limits  GLUCOSE, CAPILLARY - Abnormal; Notable for the following:    Glucose-Capillary 179 (*)    All other components within normal limits  URINE MICROSCOPIC-ADD ON - Abnormal; Notable for the following:    Squamous Epithelial / LPF 0-5 (*)    Bacteria, UA RARE (*)    All other components within normal  limits  GLUCOSE, CAPILLARY - Abnormal; Notable  for the following:    Glucose-Capillary 262 (*)    All other components within normal limits  CBG MONITORING, ED - Abnormal; Notable for the following:    Glucose-Capillary 445 (*)    All other components within normal limits  CBG MONITORING, ED - Abnormal; Notable for the following:    Glucose-Capillary 526 (*)    All other components within normal limits  CBG MONITORING, ED - Abnormal; Notable for the following:    Glucose-Capillary 492 (*)    All other components within normal limits  CBG MONITORING, ED - Abnormal; Notable for the following:    Glucose-Capillary 432 (*)    All other components within normal limits  MRSA PCR SCREENING  MAGNESIUM  TROPONIN I  LACTIC ACID, PLASMA  VITAMIN B12  OCCULT BLOOD X 1 CARD TO LAB, STOOL  FOLATE RBC  TYPE AND SCREEN  PREPARE RBC (CROSSMATCH)   EKG  EKG Interpretation  Date/Time:  Thursday March 22 2016 22:13:02 EDT Ventricular Rate:  126 PR Interval:    QRS Duration: 97 QT Interval:  326 QTC Calculation: 472 R Axis:   -31 Text Interpretation:  Sinus tachycardia Left axis deviation Minimal ST depression, lateral leads Baseline wander in lead(s) I V4 V5 V6 No significant change was found Confirmed by CAMPOS  MD, KEVIN ([id number]) on 03/23/2016 7:08:17 PM      Radiology No results found.  Procedures Procedures (including critical care time)  CENTRAL LINE Performed by: [name] Consent: The procedure was performed in an emergent situation. Required items: required blood products, implants, devices, and special equipment available Patient identity confirmed: arm band and provided demographic data Time out: Immediately prior to procedure a "time out" was called to verify the correct patient, procedure, equipment, support staff and site/side marked as required. Indications: vascular access Anesthesia: local infiltration Local anesthetic: lidocaine 1% with epinephrine Anesthetic total: 3 ml Patient sedated: no Preparation:  skin prepped with 2% chlorhexidine Skin prep agent dried: skin prep agent completely dried prior to procedure Sterile barriers: all five maximum sterile barriers used - cap, mask, sterile gown, sterile gloves, and large sterile sheet Hand hygiene: hand hygiene performed prior to central venous catheter insertion  Location details: r femoral Catheter type: triple lumen Catheter size: 8 Fr Pre-procedure: landmarks identified Ultrasound guidance: yes Successful placement: yes Post-procedure: line sutured and dressing applied Assessment: blood return through all parts, free fluid flow Patient tolerance: Patient tolerated the procedure well with no immediate complications.   CRITICAL CARE Performed by: [name] Total critical care time: 50 minutes Critical care time was exclusive of separately billable procedures and treating other patients. Critical care was necessary to treat or prevent imminent or life-threatening deterioration. Critical care was time spent personally by me on the following activities: development of treatment plan with patient and/or surrogate as well as nursing, discussions with consultants, evaluation of patient's response to treatment, examination of patient, obtaining history from patient or surrogate, ordering and performing treatments and interventions, ordering and review of laboratory studies, ordering and review of radiographic studies, pulse oximetry and re-evaluation of patient's condition.  Medications Ordered in ED Medications - No data to display   Initial Impression / Assessment and Plan / ED Course  I have reviewed the triage vital signs and the nursing notes.  Pertinent labs & imaging results that were available during my care of the patient were reviewed by me and considered in my medical decision making (see chart for  details).  Clinical Course    49yF with DKA. Suspect from noncompliance.  Difficult access. Central line placed. IVF. Insulin  gtt. Potassium minimally elevated. Supplementation deferred initially. Needs admission.   Final Clinical Impressions(s) / ED Diagnoses   Final diagnoses:  Type 1 diabetes mellitus with ketoacidosis without coma (HCC)    New Prescriptions New Prescriptions   No medications on file   I personally preformed the services scribed in my presence. The recorded information has been reviewed is accurate. [name], MD.     [name], MD 03/27/16 [phone]

## 2016-03-22 NOTE — ED Triage Notes (Signed)
Per EMS - patient from home with c/o hyperglycemia.  Patient was seen here yesterday for same thing and discharged last night.  Patient states she is "vomiting," but in fact, she is coughing and staff observed her in triage putting her fingers down her throat to make herself vomit.  Patient's vitals 172/88, HR 108, RR 30, 100% on RA.

## 2016-03-22 NOTE — ED Notes (Signed)
Patient began screaming in triage room and asked to go to the lobby because her friend is out there.  Pt relocated to lobby.

## 2016-03-22 NOTE — Progress Notes (Signed)
Patient noted to have been see in the ED 6 times with 6 admissions.  Patient's last admission from 09/24 to 09/26.  Patient was scheduled a follow up appointment at the [facility] on 03/28/2016 at 1115am.  Patient may obtain her medications at the [facility].  No further EDCM needs at this time.

## 2016-03-22 NOTE — Discharge Instructions (Signed)
Use your home zofran and reglan as directed by your doctor. Take all home medications as instructed by your primary care doctor. Stay well hydrated. Use tylenol or motrin as needed for pain. Follow up with your regular doctor in 3-5 days for recheck of symptoms and for ongoing management of your chronic abdominal pain. Return to the ER for changes or worsening symptoms.

## 2016-03-22 NOTE — ED Notes (Signed)
Per Dr.Kohut Central line is verified for usage.

## 2016-03-22 NOTE — ED Notes (Signed)
Staff was allowing Pt to sit in Triage 8.  Pt ambulated into Triage hallway and was screaming that she wants to go out into the lobby, because her friend is out there.  Staff took Pt back to the lobby in a wheelchair.

## 2016-03-23 ENCOUNTER — Encounter (HOSPITAL_COMMUNITY): Payer: Self-pay | Admitting: Family Medicine

## 2016-03-23 DIAGNOSIS — K3184 Gastroparesis: Secondary | ICD-10-CM

## 2016-03-23 DIAGNOSIS — N179 Acute kidney failure, unspecified: Secondary | ICD-10-CM

## 2016-03-23 DIAGNOSIS — D509 Iron deficiency anemia, unspecified: Secondary | ICD-10-CM

## 2016-03-23 DIAGNOSIS — E1143 Type 2 diabetes mellitus with diabetic autonomic (poly)neuropathy: Secondary | ICD-10-CM

## 2016-03-23 DIAGNOSIS — E1010 Type 1 diabetes mellitus with ketoacidosis without coma: Principal | ICD-10-CM

## 2016-03-23 DIAGNOSIS — F418 Other specified anxiety disorders: Secondary | ICD-10-CM

## 2016-03-23 LAB — GLUCOSE, CAPILLARY
GLUCOSE-CAPILLARY: 156 mg/dL — AB (ref 65–99)
GLUCOSE-CAPILLARY: 118 mg/dL — AB (ref 65–99)
GLUCOSE-CAPILLARY: 106 mg/dL — AB (ref 65–99)
GLUCOSE-CAPILLARY: 102 mg/dL — AB (ref 65–99)
GLUCOSE-CAPILLARY: 103 mg/dL — AB (ref 65–99)
GLUCOSE-CAPILLARY: 102 mg/dL — AB (ref 65–99)
GLUCOSE-CAPILLARY: 123 mg/dL — AB (ref 65–99)
GLUCOSE-CAPILLARY: 155 mg/dL — AB (ref 65–99)
Glucose-Capillary: 394 mg/dL — ABNORMAL HIGH (ref 65–99)
Glucose-Capillary: 433 mg/dL — ABNORMAL HIGH (ref 65–99)
Glucose-Capillary: 290 mg/dL — ABNORMAL HIGH (ref 65–99)
Glucose-Capillary: 235 mg/dL — ABNORMAL HIGH (ref 65–99)
Glucose-Capillary: 145 mg/dL — ABNORMAL HIGH (ref 65–99)
Glucose-Capillary: 158 mg/dL — ABNORMAL HIGH (ref 65–99)
Glucose-Capillary: 154 mg/dL — ABNORMAL HIGH (ref 65–99)

## 2016-03-23 LAB — BASIC METABOLIC PANEL
Anion gap: 12 (ref 5–15)
Anion gap: 8 (ref 5–15)
Anion gap: 10 (ref 5–15)
BUN: 19 mg/dL (ref 6–20)
BUN: 22 mg/dL — AB (ref 6–20)
BUN: 24 mg/dL — AB (ref 6–20)
BUN: 24 mg/dL — ABNORMAL HIGH (ref 6–20)
BUN: 25 mg/dL — AB (ref 6–20)
CALCIUM: 7.9 mg/dL — AB (ref 8.9–10.3)
CALCIUM: 7.8 mg/dL — AB (ref 8.9–10.3)
CHLORIDE: 116 mmol/L — AB (ref 101–111)
CHLORIDE: 114 mmol/L — AB (ref 101–111)
CO2: 10 mmol/L — ABNORMAL LOW (ref 22–32)
CO2: 12 mmol/L — AB (ref 22–32)
CO2: 13 mmol/L — AB (ref 22–32)
CO2: <7 mmol/L — ABNORMAL LOW (ref 22–32)
CO2: <7 mmol/L — ABNORMAL LOW (ref 22–32)
CREATININE: 1.59 mg/dL — AB (ref 0.44–1.00)
CREATININE: 1.3 mg/dL — AB (ref 0.44–1.00)
Calcium: 7.7 mg/dL — ABNORMAL LOW (ref 8.9–10.3)
Calcium: 7.8 mg/dL — ABNORMAL LOW (ref 8.9–10.3)
Calcium: 9.1 mg/dL (ref 8.9–10.3)
Chloride: 103 mmol/L (ref 101–111)
Chloride: 109 mmol/L (ref 101–111)
Chloride: 117 mmol/L — ABNORMAL HIGH (ref 101–111)
Creatinine, Ser: 1.05 mg/dL — ABNORMAL HIGH (ref 0.44–1.00)
Creatinine, Ser: 1.22 mg/dL — ABNORMAL HIGH (ref 0.44–1.00)
Creatinine, Ser: 1.74 mg/dL — ABNORMAL HIGH (ref 0.44–1.00)
GFR calc Af Amer: 39 mL/min — ABNORMAL LOW (ref >60)
GFR calc non Af Amer: 34 mL/min — ABNORMAL LOW (ref >60)
GFR calc non Af Amer: 37 mL/min — ABNORMAL LOW (ref >60)
GFR calc non Af Amer: 48 mL/min — ABNORMAL LOW (ref >60)
GFR calc non Af Amer: 52 mL/min — ABNORMAL LOW (ref >60)
GFR calc non Af Amer: >60 mL/min (ref >60)
GFR, EST AFRICAN AMERICAN: 43 mL/min — AB (ref >60)
GFR, EST AFRICAN AMERICAN: 55 mL/min — AB (ref >60)
GFR, EST AFRICAN AMERICAN: 60 mL/min — AB (ref >60)
Glucose, Bld: 107 mg/dL — ABNORMAL HIGH (ref 65–99)
Glucose, Bld: 125 mg/dL — ABNORMAL HIGH (ref 65–99)
Glucose, Bld: 170 mg/dL — ABNORMAL HIGH (ref 65–99)
Glucose, Bld: 471 mg/dL — ABNORMAL HIGH (ref 65–99)
Glucose, Bld: 581 mg/dL (ref 65–99)
POTASSIUM: 4.6 mmol/L (ref 3.5–5.1)
POTASSIUM: 3.7 mmol/L (ref 3.5–5.1)
Potassium: 3.9 mmol/L (ref 3.5–5.1)
Potassium: 4.7 mmol/L (ref 3.5–5.1)
Potassium: 5.4 mmol/L — ABNORMAL HIGH (ref 3.5–5.1)
SODIUM: 135 mmol/L (ref 135–145)
SODIUM: 139 mmol/L (ref 135–145)
SODIUM: 137 mmol/L (ref 135–145)
SODIUM: 136 mmol/L (ref 135–145)
Sodium: 133 mmol/L — ABNORMAL LOW (ref 135–145)

## 2016-03-23 LAB — CBC WITH DIFFERENTIAL/PLATELET
Basophils Absolute: 0 10*3/uL (ref 0.0–0.1)
Basophils Relative: 0 %
Eosinophils Absolute: 0 10*3/uL (ref 0.0–0.7)
Eosinophils Relative: 0 %
HCT: 29 % — ABNORMAL LOW (ref 36.0–46.0)
Hemoglobin: 8.4 g/dL — ABNORMAL LOW (ref 12.0–15.0)
Lymphocytes Relative: 9 %
Lymphs Abs: 1.1 10*3/uL (ref 0.7–4.0)
MCH: 21.3 pg — ABNORMAL LOW (ref 26.0–34.0)
MCHC: 29 g/dL — ABNORMAL LOW (ref 30.0–36.0)
MCV: 73.4 fL — ABNORMAL LOW (ref 78.0–100.0)
Monocytes Absolute: 1.1 10*3/uL — ABNORMAL HIGH (ref 0.1–1.0)
Monocytes Relative: 9 %
Neutro Abs: 10.4 10*3/uL — ABNORMAL HIGH (ref 1.7–7.7)
Neutrophils Relative %: 82 %
Platelets: 523 10*3/uL — ABNORMAL HIGH (ref 150–400)
RBC: 3.95 MIL/uL (ref 3.87–5.11)
RDW: 16.6 % — ABNORMAL HIGH (ref 11.5–15.5)
WBC: 12.6 10*3/uL — ABNORMAL HIGH (ref 4.0–10.5)

## 2016-03-23 LAB — URINALYSIS, ROUTINE W REFLEX MICROSCOPIC
Bilirubin Urine: NEGATIVE
Ketones, ur: >80 mg/dL — AB
Nitrite: NEGATIVE
PH: 5.5 (ref 5.0–8.0)
PROTEIN: 30 mg/dL — AB
Specific Gravity, Urine: 1.022 (ref 1.005–1.030)

## 2016-03-23 LAB — BLOOD GAS, ARTERIAL
Drawn by: 422461
FIO2: 0.21
O2 Saturation: 95.9 %
Patient temperature: 99.3
pH, Arterial: 7.166 — CL (ref 7.350–7.450)
pO2, Arterial: 120 mmHg — ABNORMAL HIGH (ref 83.0–108.0)

## 2016-03-23 LAB — VITAMIN B12: VITAMIN B 12: 189 pg/mL (ref 180–914)

## 2016-03-23 LAB — FERRITIN: Ferritin: 10 ng/mL — ABNORMAL LOW (ref 11–307)

## 2016-03-23 LAB — CBG MONITORING, ED
GLUCOSE-CAPILLARY: 432 mg/dL — AB (ref 65–99)
Glucose-Capillary: 492 mg/dL — ABNORMAL HIGH (ref 65–99)

## 2016-03-23 LAB — IRON AND TIBC
Iron: <5 ug/dL — ABNORMAL LOW (ref 28–170)
TIBC: 431 ug/dL (ref 250–450)

## 2016-03-23 LAB — URINE MICROSCOPIC-ADD ON

## 2016-03-23 LAB — TROPONIN I

## 2016-03-23 LAB — LACTIC ACID, PLASMA
LACTIC ACID, VENOUS: 3 mmol/L — AB (ref 0.5–1.9)
Lactic Acid, Venous: 1.3 mmol/L (ref 0.5–1.9)

## 2016-03-23 LAB — MAGNESIUM: Magnesium: 2.3 mg/dL (ref 1.7–2.4)

## 2016-03-23 LAB — MRSA PCR SCREENING: MRSA by PCR: NEGATIVE

## 2016-03-23 MED ORDER — HEPARIN SODIUM (PORCINE) 5000 UNIT/ML IJ SOLN
5000.0000 [IU] | Freq: Three times a day (TID) | INTRAMUSCULAR | Status: DC
  Administered 2016-03-23 – 2016-03-25 (×7): 5000 [IU] via SUBCUTANEOUS
  Filled 2016-03-23 (×8): qty 1

## 2016-03-23 MED ORDER — SODIUM CHLORIDE 0.9 % IV SOLN
INTRAVENOUS | Status: DC
  Administered 2016-03-23: 4.3 [IU]/h via INTRAVENOUS

## 2016-03-23 MED ORDER — SODIUM CHLORIDE 0.9 % IV SOLN
75.0000 mg | Freq: Once | INTRAVENOUS | Status: AC
  Administered 2016-03-23: 75 mg via INTRAVENOUS
  Filled 2016-03-23: qty 1.5

## 2016-03-23 MED ORDER — POLYETHYLENE GLYCOL 3350 17 G PO PACK
17.0000 g | PACK | Freq: Every day | ORAL | Status: DC | PRN
  Administered 2016-03-24: 17 g via ORAL
  Filled 2016-03-23: qty 1

## 2016-03-23 MED ORDER — IRON DEXTRAN 50 MG/ML IJ SOLN: 100.0000 mg | Freq: Once | INTRAMUSCULAR | Status: DC

## 2016-03-23 MED ORDER — PREGABALIN 75 MG PO CAPS
150.0000 mg | ORAL_CAPSULE | Freq: Two times a day (BID) | ORAL | Status: DC
  Administered 2016-03-23 – 2016-03-26 (×7): 150 mg via ORAL
  Filled 2016-03-23 (×2): qty 2
  Filled 2016-03-23: qty 6
  Filled 2016-03-23: qty 2
  Filled 2016-03-23 (×2): qty 3
  Filled 2016-03-23: qty 2

## 2016-03-23 MED ORDER — SODIUM CHLORIDE 0.9 % IV BOLUS (SEPSIS)
2000.0000 mL | Freq: Once | INTRAVENOUS | Status: AC
  Administered 2016-03-23: 2000 mL via INTRAVENOUS

## 2016-03-23 MED ORDER — ACETAMINOPHEN 325 MG PO TABS: 650.0000 mg | ORAL_TABLET | Freq: Four times a day (QID) | ORAL | Status: DC | PRN

## 2016-03-23 MED ORDER — INSULIN DETEMIR 100 UNIT/ML ~~LOC~~ SOLN
15.0000 [IU] | Freq: Every day | SUBCUTANEOUS | Status: DC
  Administered 2016-03-23 – 2016-03-25 (×3): 15 [IU] via SUBCUTANEOUS
  Filled 2016-03-23 (×4): qty 0.15

## 2016-03-23 MED ORDER — SODIUM CHLORIDE 0.9 % IV SOLN
INTRAVENOUS | Status: DC
  Administered 2016-03-23: 02:00:00 via INTRAVENOUS

## 2016-03-23 MED ORDER — DEXTROSE-NACL 5-0.45 % IV SOLN
INTRAVENOUS | Status: DC
  Administered 2016-03-23 – 2016-03-24 (×2): via INTRAVENOUS

## 2016-03-23 MED ORDER — SODIUM CHLORIDE 0.9 % IV SOLN
25.0000 mg | Freq: Once | INTRAVENOUS | Status: AC
  Administered 2016-03-23: 25 mg via INTRAVENOUS
  Filled 2016-03-23: qty 0.5

## 2016-03-23 MED ORDER — BUSPIRONE HCL 5 MG PO TABS
5.0000 mg | ORAL_TABLET | Freq: Three times a day (TID) | ORAL | Status: DC
  Administered 2016-03-23 – 2016-03-26 (×10): 5 mg via ORAL
  Filled 2016-03-23 (×10): qty 1

## 2016-03-23 MED ORDER — FAMOTIDINE IN NACL 20-0.9 MG/50ML-% IV SOLN
20.0000 mg | Freq: Two times a day (BID) | INTRAVENOUS | Status: DC
  Administered 2016-03-23 (×2): 20 mg via INTRAVENOUS
  Filled 2016-03-23 (×3): qty 50

## 2016-03-23 MED ORDER — INSULIN ASPART 100 UNIT/ML ~~LOC~~ SOLN
3.0000 [IU] | Freq: Three times a day (TID) | SUBCUTANEOUS | Status: DC
  Administered 2016-03-23 – 2016-03-26 (×9): 3 [IU] via SUBCUTANEOUS

## 2016-03-23 MED ORDER — INSULIN ASPART 100 UNIT/ML ~~LOC~~ SOLN
0.0000 [IU] | Freq: Every day | SUBCUTANEOUS | Status: DC
  Administered 2016-03-25: 4 [IU] via SUBCUTANEOUS

## 2016-03-23 MED ORDER — DEXTROSE-NACL 5-0.45 % IV SOLN: INTRAVENOUS | Status: DC

## 2016-03-23 MED ORDER — LORAZEPAM 2 MG/ML IJ SOLN: 0.5000 mg | Freq: Once | INTRAMUSCULAR | Status: DC

## 2016-03-23 MED ORDER — METOCLOPRAMIDE HCL 10 MG PO TABS
10.0000 mg | ORAL_TABLET | Freq: Three times a day (TID) | ORAL | Status: DC
  Administered 2016-03-23 – 2016-03-26 (×10): 10 mg via ORAL
  Filled 2016-03-23 (×9): qty 1

## 2016-03-23 MED ORDER — SODIUM CHLORIDE 0.9 % IV SOLN
INTRAVENOUS | Status: DC
  Administered 2016-03-23: 4.3 [IU]/h via INTRAVENOUS
  Filled 2016-03-23: qty 2.5

## 2016-03-23 MED ORDER — INSULIN ASPART 100 UNIT/ML ~~LOC~~ SOLN
0.0000 [IU] | Freq: Three times a day (TID) | SUBCUTANEOUS | Status: DC
  Administered 2016-03-23: 2 [IU] via SUBCUTANEOUS
  Administered 2016-03-24: 9 [IU] via SUBCUTANEOUS
  Administered 2016-03-24: 2 [IU] via SUBCUTANEOUS
  Administered 2016-03-24: 9 [IU] via SUBCUTANEOUS
  Administered 2016-03-24: 2 [IU] via SUBCUTANEOUS
  Administered 2016-03-25: 5 [IU] via SUBCUTANEOUS
  Administered 2016-03-25 (×2): 2 [IU] via SUBCUTANEOUS
  Administered 2016-03-26: 1 [IU] via SUBCUTANEOUS
  Administered 2016-03-26: 2 [IU] via SUBCUTANEOUS

## 2016-03-23 MED ORDER — SODIUM CHLORIDE 0.9 % IV BOLUS (SEPSIS)
1000.0000 mL | Freq: Once | INTRAVENOUS | Status: AC
  Administered 2016-03-23: 1000 mL via INTRAVENOUS

## 2016-03-23 NOTE — H&P (Signed)
History and Physical    Modena Steedley [id number] DOB: 06-01-1966 DOA: 03/22/2016  PCP: [name], MD   Patient coming from: Home   Chief Complaint: High blood sugars, N/V  HPI: [name] is a 50 y.o. female with medical history significant for type 1 diabetes mellitus, depression, anxiety, hypertension, and gastroparesis who presents the emergency department with hyperglycemia and nausea with vomiting. Patient was just discharged from the hospital on 03/20/2016 in much improved and stable condition after she was managed for DKA. She returned again to the hospital yesterday with high blood sugars and nausea with vomiting but was able to be treated effectively in the emergency department and discharged back home. She presents again today with the same complaints. She describes pain in the central abdomen with nausea and nonbloody nonbilious vomiting. She denies fevers, but endorses some chills. She denies chest pain or palpitations and denies dyspnea or cough. Patient denies dysuria, flank pain, diarrhea, melena, or hematochezia. Patient reports taking 25 units of Levemir on the day of her presentation, but reports that she did not take any NovoLog today.  ED Course: Upon arrival to the ED, patient is found to be afebrile, saturating well on room air, tachycardic to 120, and with vitals otherwise stable. EKG demonstrates sinus tachycardia with rate 126 and minimal ST depression in lateral leads. Chest x-ray is negative for acute cardiopulmonary disease. Chemistry panel features a sodium 133, potassium 5.4, bicarbonate undetectable he low, creatinine 1.74, and serum glucose 581. Anion gap cannot be calculated and serum creatinine had been less than 1 the day prior. CBC is notable for stable microcytic anemia with hemoglobin of 8.4, and also features a leukocytosis to 12,600 and thrombocytosis with platelets of 523,000. She was given 3 L of normal saline in the emergency department and started  on insulin infusion. She was treated symptomatically with Ativan and Haldol. She'll be admitted to the stepdown unit for ongoing evaluation and management of moderate-severe diabetic ketoacidosis.  Review of Systems:  All other systems reviewed and apart from HPI, are negative.  Past Medical History:  Diagnosis Date  . Depression   . Diabetes mellitus without complication ([place])   . Essential hypertension   . Gastroparesis   . GERD (gastroesophageal reflux disease)   . HLD (hyperlipidemia)     Past Surgical History:  Procedure Laterality Date  . COLONOSCOPY  09/27/2014   at [facility]  . ESOPHAGOGASTRODUODENOSCOPY  09/27/2014   at [facility], Dr Rolan Lipa. biospy neg for celiac, neg for H pylori.   . EYE SURGERY    . gailstones    . IR GENERIC HISTORICAL  01/24/2016   IR FLUORO GUIDE CV LINE RIGHT 01/24/2016 Darrell K Allred, PA-C [facility]  . IR GENERIC HISTORICAL  01/24/2016   IR US GUIDE VASC ACCESS RIGHT 01/24/2016 Darrell K Allred, PA-C [facility]  . POSTERIOR VITRECTOMY AND MEMBRANE PEEL-LEFT EYE  09/28/2002  . POSTERIOR VITRECTOMY AND MEMBRANE PEEL-RIGHT EYE  03/16/2002  . RETINAL DETACHMENT SURGERY       reports that she has never smoked. She has never used smokeless tobacco. She reports that she does not drink alcohol or use drugs.  Allergies  Allergen Reactions  . Anesthetics, Amide Nausea And Vomiting  . Penicillins Diarrhea and Nausea And Vomiting    Has patient had a PCN reaction causing immediate rash, facial/tongue/throat swelling, SOB or lightheadedness with hypotension: No Has patient had a PCN reaction causing severe rash involving mucus membranes or skin necrosis: No Has patient had  a PCN reaction that required hospitalization No Has patient had a PCN reaction occurring within the last 10 years: Yes  If all of the above answers are "NO", then may proceed with Cephalosporin use.   . Buprenorphine Hcl Rash  . Encainide Nausea And Vomiting    Family History    Problem Relation Age of Onset  . Cystic fibrosis Mother   . Hypertension Father   . Diabetes Brother   . Hypertension Maternal Grandmother      Prior to Admission medications   Medication Sig Start Date End Date Taking? Authorizing Provider  albuterol (PROVENTIL HFA;VENTOLIN HFA) 108 (90 Base) MCG/ACT inhaler Inhale 2 puffs into the lungs every 6 (six) hours as needed for wheezing or shortness of breath. 10/20/15  Yes [name], MD  busPIRone (BUSPAR) 5 MG tablet Take 1 tablet (5 mg total) by mouth 3 (three) times daily. 01/31/16  Yes [name], MD  furosemide (LASIX) 40 MG tablet Take 40 mg by mouth daily as needed for fluid.    Yes Historical Provider, MD  insulin aspart (NOVOLOG) 100 UNIT/ML injection Inject 0-15 Units into the skin 3 (three) times daily with meals. Per sliding scale 01/31/16  Yes [name], MD  insulin detemir (LEVEMIR) 100 UNIT/ML injection Inject 0.25 mLs (25 Units total) into the skin at bedtime. 03/20/16  Yes Janece Canterbury, MD  ondansetron (ZOFRAN ODT) 4 MG disintegrating tablet Take 1 tablet (4 mg total) by mouth every 8 (eight) hours as needed for nausea. 01/31/16  Yes [name], MD  polyethylene glycol (MIRALAX / GLYCOLAX) packet Take 17 g by mouth daily as needed for mild constipation. 03/20/16  Yes Janece Canterbury, MD  pregabalin (LYRICA) 150 MG capsule Take 1 capsule (150 mg total) by mouth 2 (two) times daily. 03/13/16  Yes [name], MD  Blood Glucose Monitoring Suppl (TRUE METRIX METER) DEVI 1 each by Does not apply route 3 (three) times daily before meals. 07/19/15   [name], MD  erythromycin (E-MYCIN) 250 MG tablet Take 1 tablet (250 mg total) by mouth 3 (three) times daily with meals as needed. 03/20/16   Janece Canterbury, MD  glucose blood (TRUE METRIX BLOOD GLUCOSE TEST) test strip Use 3 times daily before meals 03/13/16   [name], MD  Insulin Syringe-Needle U-100 (BD INSULIN SYRINGE ULTRAFINE) 31G X 15/64" 0.5 ML MISC 1 each by Does not  apply route 4 (four) times daily as needed. 08/16/15   [name], MD    Physical Exam: Vitals:   03/22/16 1905 03/22/16 2157 03/22/16 2358 03/23/16 0049  BP: (!) 155/53 154/56 (!) 120/49 (!) 105/45  Pulse: 118 (!) 125 (!) 126 (!) 121  Resp: 20 21 26  (!) 28  Temp: 99.3 F (37.4 C)     TempSrc: Oral     SpO2: 100% 100% 100% 100%  Weight:   65.3 kg (144 lb)   Height:   5\' 2"  (1.575 m)       Constitutional: NAD, calm, in apparent discomfort Eyes: PERTLA, lids and conjunctivae normal ENMT: Mucous membranes are dry. Posterior pharynx clear of any exudate or lesions.   Neck: normal, supple, no masses, no thyromegaly Respiratory: clear to auscultation bilaterally, no wheezing, no crackles. Normal respiratory effort.    Cardiovascular: Rate ~120 and regular. No extremity edema. 2+ pedal pulses. No significant JVD. Abdomen: No distension, tender throughout without rebound pain or guarding, no masses palpated. Bowel sounds normal.  Musculoskeletal: no clubbing / cyanosis. No joint deformity upper and lower  extremities. Normal muscle tone.  Skin: no significant rashes, lesions, ulcers. Warm, dry, well-perfused. Neurologic: CN 2-12 grossly intact. Sensation intact, DTR normal. Strength 5/5 in all 4 limbs.  Psychiatric: Normal judgment and insight. Alert and oriented x 3. Anxious.     Labs on Admission: I have personally reviewed following labs and imaging studies  CBC:  Recent Labs Lab 03/17/16 1301 03/18/16 1023 03/21/16 2111 03/22/16 2351  WBC 6.5 13.2* 5.5 12.6*  NEUTROABS 4.7 12.1* 4.1 10.4*  HGB 10.9* 10.8* 9.3* 8.4*  HCT 36.3 37.2 30.9* 29.0*  MCV 71.6* 74.8* 70.7* 73.4*  PLT 467* 487* 369 0000000*   Basic Metabolic Panel:  Recent Labs Lab 03/19/16 0759 03/19/16 1537 03/20/16 0431 03/21/16 2111 03/22/16 2351 03/22/16 2352  NA 139 134* 138 134* 133*  --   K 4.1 4.0 4.0 4.7 5.4*  --   CL 111 108 112* 104 103  --   CO2 20* 19* 20* 21* <7*  --   GLUCOSE 174* 240*  60* 383* 581*  --   BUN 20 18 21* 17 24*  --   CREATININE 1.24* 1.18* 1.08* 0.98 1.74*  --   CALCIUM 9.2 9.0 8.9 9.0 9.1  --   MG  --   --   --   --   --  2.3   GFR: Estimated Creatinine Clearance: 35.1 mL/min (by C-G formula based on SCr of 1.74 mg/dL (H)). Liver Function Tests:  Recent Labs Lab 03/17/16 1301 03/21/16 2111  AST 30 25  ALT 16 17  ALKPHOS 98 74  BILITOT 0.8 1.4*  PROT 9.1* 7.5  ALBUMIN 4.6 3.7    Recent Labs Lab 03/17/16 1301 03/21/16 2111  LIPASE 45 31   No results for input(s): AMMONIA in the last 168 hours. Coagulation Profile: No results for input(s): INR, PROTIME in the last 168 hours. Cardiac Enzymes: No results for input(s): CKTOTAL, CKMB, CKMBINDEX, TROPONINI in the last 168 hours. BNP (last 3 results) No results for input(s): PROBNP in the last 8760 hours. HbA1C: No results for input(s): HGBA1C in the last 72 hours. CBG:  Recent Labs Lab 03/20/16 1214 03/21/16 1829 03/22/16 1501 03/22/16 2031 03/23/16 0111  GLUCAP 172* 285* 445* 526* 492*   Lipid Profile: No results for input(s): CHOL, HDL, LDLCALC, TRIG, CHOLHDL, LDLDIRECT in the last 72 hours. Thyroid Function Tests: No results for input(s): TSH, T4TOTAL, FREET4, T3FREE, THYROIDAB in the last 72 hours. Anemia Panel: No results for input(s): VITAMINB12, FOLATE, FERRITIN, TIBC, IRON, RETICCTPCT in the last 72 hours. Urine analysis:    Component Value Date/Time   COLORURINE YELLOW 03/22/2016 0105   APPEARANCEUR CLEAR 03/22/2016 0105   LABSPEC 1.022 03/22/2016 0105   PHURINE 6.0 03/22/2016 0105   GLUCOSEU >1000 (A) 03/22/2016 0105   HGBUR NEGATIVE 03/22/2016 0105   BILIRUBINUR NEGATIVE 03/22/2016 0105   BILIRUBINUR NEGATIVE 08/04/2012 1005   KETONESUR 40 (A) 03/22/2016 0105   PROTEINUR NEGATIVE 03/22/2016 0105   UROBILINOGEN 0.2 04/27/2015 1608   NITRITE NEGATIVE 03/22/2016 0105   LEUKOCYTESUR NEGATIVE 03/22/2016 0105   Sepsis  Labs: @LABRCNTIP (procalcitonin:4,lacticidven:4) ) Recent Results (from the past 240 hour(s))  MRSA PCR Screening     Status: None   Collection Time: 03/18/16  3:58 PM  Result Value Ref Range Status   MRSA by PCR NEGATIVE NEGATIVE Final    Comment:        The GeneXpert MRSA Assay (FDA approved for NASAL specimens only), is one component of a comprehensive MRSA colonization surveillance program. It  is not intended to diagnose MRSA infection nor to guide or monitor treatment for MRSA infections.      Radiological Exams on Admission: Dg Chest Portable 1 View  Result Date: 03/22/2016 CLINICAL DATA:  Central line placement.  Initial encounter. EXAM: PORTABLE CHEST 1 VIEW COMPARISON:  Chest radiograph from 01/19/2016 FINDINGS: No central line is seen. The lungs are well-aerated and clear. There is no evidence of focal opacification, pleural effusion or pneumothorax. Bilateral nipple shadows are noted. The cardiomediastinal silhouette is within normal limits. No acute osseous abnormalities are seen. IMPRESSION: No central line seen.  No acute cardiopulmonary process identified. Electronically Signed   By: Garald Balding M.D.   On: 03/22/2016 23:56    EKG: Independently reviewed. Sinus tachycardia (rate 126), minimal ST-depression in lateral leads  Assessment/Plan  1. DKA, moderate-severe, in type 1 DM  - Presents with serum glucose 580, pH 7.17, and urine ketones  - Suspect this is secondary to poor adherence with treatment regimen  - A1c was 10.3% earlier this month and she is prescribed Levemir 25 units qHS and Novolog 0-15 TID per sliding-scale  - She was given 3 L of NS in ED and will be continued on NS infusion at 100 cc/hr - Insulin infusion ordered, there is delay in getting this from pharmacy, will continue until DKA resolves  - Serial chem panel q4h until DKA resolving  - Monitor in stepdown unit    2. AKI  - SCr 1.74 on admission, up from 0.98 the day prior  - Likely a  prerenal azotemia in the setting of DKA  - Given aggressive fluid-resuscitation as above  - Serial chem panels as above  - Hold Lasix    3. Diabetic gastroparesis  - Pt reports this is symptomatic on presentation  - Reglan 10 mg with meals    4. Microcytic anemia   - Hgb 8.4 on admission with MCV 73.4  - Recent Hgb values in 7-10 range  - Denies melena or hematochezia; EGC normal in 2010  - Check iron studies, B12, and folate; supplement prn    5. Anxiety, depression - Treated with Haldol and Ativan in ED - Continue Buspar    6. Leukocytosis, thrombocytosis - WBC 12,600 and platelets 523,000 on admission, likely reactive in setting of DKA  - Culture if febrile   DVT prophylaxis: sq heparin Code Status: Full  Family Communication: Discussed with patient Disposition Plan: Admit to stepdown Consults called: None Admission status: Inpatient    Vianne Bulls, MD [facility] Pager [phone]  If 7PM-7AM, please contact night-coverage [email or web address] Password [email or web address]  03/23/2016, 1:23 AM

## 2016-03-23 NOTE — Care Management Note (Signed)
Case Management Note  Patient Details  Name: [name] MRN: [id number] Date of Birth: 1965/07/27  Subjective/Objective:     Noncompliant diabetic with dka               Action/Plan: home   Expected Discharge Date:                  Expected Discharge Plan:     In-House Referral:     Discharge planning Services     Post Acute Care Choice:    Choice offered to:     DME Arranged:    DME Agency:     HH Arranged:    [place] Agency:     Status of Service:     If discussed at [employer], dates discussed:    Additional Comments:Date:  March 23, 2016 Chart reviewed for concurrent status and case management needs. Will continue to follow the patient for status change: Discharge Planning: following for needs Expected discharge date: [id number] [name], BSN, [place], [place]  Yvette Cha, RN 03/23/2016, 8:49 AM

## 2016-03-23 NOTE — ED Notes (Signed)
Hospitalist at bedside 

## 2016-03-23 NOTE — ED Notes (Signed)
Dr. Wilson Singer notified of critical glucose of 581.  Note left on RN's desk to notify of critical value.

## 2016-03-23 NOTE — Progress Notes (Addendum)
Inpatient Diabetes Program Recommendations  AACE/ADA: New Consensus Statement on Inpatient Glycemic Control (2015)  Target Ranges:  Prepandial:   less than 140 mg/dL      Peak postprandial:   less than 180 mg/dL (1-2 hours)      Critically ill patients:  140 - 180 mg/dL   Review of Glycemic Control  Diabetes history: DM 1 (multiple admissions 10 th since January) Outpatient Diabetes medications: Levemir 25 units, Novolog 0-15 units TID Current orders for Inpatient glycemic control: IV insulin  Patient is very familiar to our team and has been counseled many times especially about sick day guidelines. Patient does not take insulin at time when feeling sick due to fear of hypoglycemia.  Inpatient Diabetes Program Recommendations:   When transition is appropriate please consider the following regimen: Levemir 15 units QHS, Novolog Sensitive Correction Scale/ SSI (0-9 units) TID AC, Novolog 3 units tidwc  Patient had hypoglycemia last admission when placed on Levemir 20 units.  Thanks,  Tama Headings RN, MSN, [facility] Inpatient Diabetes Coordinator Team Pager [phone] (8a-5p)

## 2016-03-23 NOTE — Progress Notes (Signed)
PROGRESS NOTE    [name]  [id number] DOB: 1966-03-11 DOA: 03/22/2016 PCP: [name], MD    Brief Narrative:  50yo female with hx of DM who presents with recurrent DKA after recent hospital discharge for same.  Assessment & Plan:   Principal Problem:   Diabetic ketoacidosis without coma associated with type 1 diabetes mellitus (HCC) Active Problems:   Nausea & vomiting   Anxiety and depression   Diabetic gastroparesis (HCC)   AKI (acute kidney injury) ([place])   Anemia, iron deficiency   GERD (gastroesophageal reflux disease)   DKA (diabetic ketoacidoses) ([place])   1. DKA, moderate-severe, in type 1 DM  - Presents with serum glucose 580, pH 7.17, and positive urine ketones  - suspect noncompliance with diabetic regimen  - A1c was 10.3% earlier this month -Patient admitted to SDU. -glucose improved on insulin gtt. -Labs reviewed. Anion gap closed. Will transition to subq insulin per diabetic coordinator recommendations   2. AKI  - SCr 1.74 on admission, improved to 1.05 today - Likely a prerenal azotemia in the setting of DKA  -Will check bmet in AM  3. Diabetic gastroparesis  - Improved with reglan 10 mg with meals   -Will start clears and advance as tolerated  4. Microcytic anemia   - Hgb 8.4 on admission with MCV 73.4  - Recent Hgb values in 7-10 range  - Denies melena or hematochezia; EGC normal in 2010  - Low iron of <5 -Will give IV iron  5. Anxiety, depression - Given Haldol and Ativan in ED - Continue Buspar as tolerated  6. Leukocytosis, thrombocytosis - WBC 12,600 and platelets 523,000 on admission, likely reactive in setting of DKA  - remains stable -will repeat CBC in AM   DVT prophylaxis: Heparin subq Code Status: Full Family Communication: Pt in room, family not at bedside Disposition Plan: Uncertain at this time  Consultants:   Diabetic coordinator  Procedures:     Antimicrobials: Anti-infectives    None        Subjective: Still complaining of abd pain this AM  Objective: Vitals:   03/23/16 0900 03/23/16 1000 03/23/16 1100 03/23/16 1200  BP:  130/66  (!) 145/53  Pulse: (!) 103 (!) 107 (!) 105 (!) 107  Resp: 19 19 20  (!) 23  Temp:    98.7 F (37.1 C)  TempSrc:    Oral  SpO2: 100% 100% 100% 100%  Weight:      Height:        Intake/Output Summary (Last 24 hours) at 03/23/16 1454 Last data filed at 03/23/16 1143  Gross per 24 hour  Intake          3264.78 ml  Output                0 ml  Net          3264.78 ml   Filed Weights   03/22/16 2358 03/23/16 0304  Weight: 65.3 kg (144 lb) 64.7 kg (142 lb 10.2 oz)    Examination:  General exam: Awake, appears uncomfortable Respiratory system: Clear to auscultation. Respiratory effort normal. Cardiovascular system: S1 & S2 heard, RRR. No JVD, murmurs, rubs, gallops or clicks. No pedal edema. Gastrointestinal system: Abdomen is nondistended, generally tender. Central nervous system: Alert and oriented. No focal neurological deficits. Extremities: Symmetric 5 x 5 power. Skin: No rashes, lesions or ulcers Psychiatry: Judgement and insight appear normal. Mood & affect appropriate.   Data Reviewed: I have personally reviewed following labs and  imaging studies  CBC:  Recent Labs Lab 03/17/16 1301 03/18/16 1023 03/21/16 2111 03/22/16 2351  WBC 6.5 13.2* 5.5 12.6*  NEUTROABS 4.7 12.1* 4.1 10.4*  HGB 10.9* 10.8* 9.3* 8.4*  HCT 36.3 37.2 30.9* 29.0*  MCV 71.6* 74.8* 70.7* 73.4*  PLT 467* 487* 369 0000000*   Basic Metabolic Panel:  Recent Labs Lab 03/22/16 2351 03/22/16 2352 03/23/16 0210 03/23/16 0550 03/23/16 0835 03/23/16 1315  NA 133*  --  135 139 137 136  K 5.4*  --  4.7 3.9 4.6 3.7  CL 103  --  109 117* 116* 114*  CO2 <7*  --  <7* 10* 13* 12*  GLUCOSE 581*  --  471* 170* 107* 125*  BUN 24*  --  25* 24* 22* 19  CREATININE 1.74*  --  1.59* 1.30* 1.22* 1.05*  CALCIUM 9.1  --  7.9* 7.8* 7.7* 7.8*  MG  --  2.3  --   --    --   --    GFR: Estimated Creatinine Clearance: 57.8 mL/min (by C-G formula based on SCr of 1.05 mg/dL (H)). Liver Function Tests:  Recent Labs Lab 03/17/16 1301 03/21/16 2111  AST 30 25  ALT 16 17  ALKPHOS 98 74  BILITOT 0.8 1.4*  PROT 9.1* 7.5  ALBUMIN 4.6 3.7    Recent Labs Lab 03/17/16 1301 03/21/16 2111  LIPASE 45 31   No results for input(s): AMMONIA in the last 168 hours. Coagulation Profile: No results for input(s): INR, PROTIME in the last 168 hours. Cardiac Enzymes:  Recent Labs Lab 03/23/16 0210  TROPONINI <0.03   BNP (last 3 results) No results for input(s): PROBNP in the last 8760 hours. HbA1C: No results for input(s): HGBA1C in the last 72 hours. CBG:  Recent Labs Lab 03/22/16 2031 03/23/16 0111 03/23/16 0216 03/23/16 0639 03/23/16 0746  GLUCAP 526* 492* 432* 156* 118*   Lipid Profile: No results for input(s): CHOL, HDL, LDLCALC, TRIG, CHOLHDL, LDLDIRECT in the last 72 hours. Thyroid Function Tests: No results for input(s): TSH, T4TOTAL, FREET4, T3FREE, THYROIDAB in the last 72 hours. Anemia Panel:  Recent Labs  03/23/16 0210  VITAMINB12 189  FERRITIN 10*  TIBC 431  IRON <5*   Sepsis Labs:  Recent Labs Lab 03/23/16 0210 03/23/16 0550  LATICACIDVEN 3.0* 1.3    Recent Results (from the past 240 hour(s))  MRSA PCR Screening     Status: None   Collection Time: 03/18/16  3:58 PM  Result Value Ref Range Status   MRSA by PCR NEGATIVE NEGATIVE Final    Comment:        The GeneXpert MRSA Assay (FDA approved for NASAL specimens only), is one component of a comprehensive MRSA colonization surveillance program. It is not intended to diagnose MRSA infection nor to guide or monitor treatment for MRSA infections.   MRSA PCR Screening     Status: None   Collection Time: 03/23/16  3:08 AM  Result Value Ref Range Status   MRSA by PCR NEGATIVE NEGATIVE Final    Comment:        The GeneXpert MRSA Assay (FDA approved for NASAL  specimens only), is one component of a comprehensive MRSA colonization surveillance program. It is not intended to diagnose MRSA infection nor to guide or monitor treatment for MRSA infections.      Radiology Studies: Dg Chest Portable 1 View  Result Date: 03/22/2016 CLINICAL DATA:  Central line placement.  Initial encounter. EXAM: PORTABLE CHEST 1 VIEW COMPARISON:  Chest radiograph from 01/19/2016 FINDINGS: No central line is seen. The lungs are well-aerated and clear. There is no evidence of focal opacification, pleural effusion or pneumothorax. Bilateral nipple shadows are noted. The cardiomediastinal silhouette is within normal limits. No acute osseous abnormalities are seen. IMPRESSION: No central line seen.  No acute cardiopulmonary process identified. Electronically Signed   By: Garald Balding M.D.   On: 03/22/2016 23:56    Scheduled Meds: . busPIRone  5 mg Oral TID  . famotidine (PEPCID) IV  20 mg Intravenous Q12H  . heparin  5,000 Units Subcutaneous Q8H  . insulin aspart  0-5 Units Subcutaneous QHS  . insulin aspart  0-9 Units Subcutaneous TID WC  . insulin aspart  3 Units Subcutaneous TID WC  . insulin detemir  15 Units Subcutaneous QHS  . LORazepam  0.5 mg Intravenous Once  . metoCLOPramide  10 mg Oral TID AC  . pregabalin  150 mg Oral BID   Continuous Infusions: . sodium chloride Stopped (03/23/16 0535)  . dextrose 5 % and 0.45% NaCl 75 mL/hr at 03/23/16 0535  . insulin (NOVOLIN-R) infusion Stopped (03/23/16 1130)     LOS: 0 days   Diangelo Radel, Orpah Melter, MD [facility] Pager [phone]  If 7PM-7AM, please contact night-coverage [email or web address] Password [email or web address] 03/23/2016, 2:54 PM

## 2016-03-24 LAB — GLUCOSE, CAPILLARY
GLUCOSE-CAPILLARY: 448 mg/dL — AB (ref 65–99)
GLUCOSE-CAPILLARY: 374 mg/dL — AB (ref 65–99)
GLUCOSE-CAPILLARY: 129 mg/dL — AB (ref 65–99)
Glucose-Capillary: 185 mg/dL — ABNORMAL HIGH (ref 65–99)

## 2016-03-24 LAB — BASIC METABOLIC PANEL
ANION GAP: 4 — AB (ref 5–15)
BUN: 10 mg/dL (ref 6–20)
CALCIUM: 7.8 mg/dL — AB (ref 8.9–10.3)
CO2: 19 mmol/L — AB (ref 22–32)
CREATININE: 1.04 mg/dL — AB (ref 0.44–1.00)
Chloride: 114 mmol/L — ABNORMAL HIGH (ref 101–111)
Glucose, Bld: 136 mg/dL — ABNORMAL HIGH (ref 65–99)
Potassium: 3.3 mmol/L — ABNORMAL LOW (ref 3.5–5.1)
SODIUM: 137 mmol/L (ref 135–145)

## 2016-03-24 LAB — OCCULT BLOOD X 1 CARD TO LAB, STOOL: FECAL OCCULT BLD: NEGATIVE

## 2016-03-24 LAB — PREPARE RBC (CROSSMATCH)

## 2016-03-24 MED ORDER — POTASSIUM CHLORIDE CRYS ER 20 MEQ PO TBCR
40.0000 meq | EXTENDED_RELEASE_TABLET | Freq: Once | ORAL | Status: AC
  Administered 2016-03-24: 40 meq via ORAL
  Filled 2016-03-24: qty 2

## 2016-03-24 MED ORDER — FAMOTIDINE 20 MG PO TABS
20.0000 mg | ORAL_TABLET | Freq: Two times a day (BID) | ORAL | Status: DC
  Administered 2016-03-24 – 2016-03-26 (×5): 20 mg via ORAL
  Filled 2016-03-24 (×5): qty 1

## 2016-03-24 MED ORDER — SODIUM CHLORIDE 0.9 % IV SOLN
INTRAVENOUS | Status: DC
  Administered 2016-03-24: 18:00:00 via INTRAVENOUS

## 2016-03-24 MED ORDER — SODIUM CHLORIDE 0.9 % IV SOLN: Freq: Once | INTRAVENOUS | Status: DC

## 2016-03-24 MED ORDER — ONDANSETRON HCL 4 MG/2ML IJ SOLN: 4.0000 mg | Freq: Four times a day (QID) | INTRAMUSCULAR | Status: DC | PRN

## 2016-03-24 NOTE — Progress Notes (Signed)
Dr Wyline Copas called concerning patients CBG of 448, new orders given. Novolog 9 units given as directed, will recheck prior to patient eating dinner and notify MD. Will continue to monitor.

## 2016-03-24 NOTE — Progress Notes (Signed)
CRITICAL VALUE ALERT  Critical value received:  Hbg 6.9  Date of notification:  03/24/2016  Time of notification:  [id number]  Critical value read back:Yes.    Nurse who received alert:  Reche [place]  MD notified (1st page):  Jonette Eva (NP)  Time of first page:  [phone]  MD notified (2nd page):  Time of second page:  Responding MD: Jonette Eva  Time MD responded:  [phone]

## 2016-03-24 NOTE — Progress Notes (Signed)
Dr Wyline Copas notified of CBG of 374 prior to meal, will give Novolog 9 units and 3 units for meal coverage. Will continue to monitor.

## 2016-03-24 NOTE — Progress Notes (Signed)
PROGRESS NOTE    [name]  [id number] DOB: 06/22/1966 DOA: 03/22/2016 PCP: [name], MD    Brief Narrative:  50yo female with hx of DM who presents with recurrent DKA after recent hospital discharge for same.  Assessment & Plan:   Principal Problem:   Diabetic ketoacidosis without coma associated with type 1 diabetes mellitus (HCC) Active Problems:   Nausea & vomiting   Anxiety and depression   Diabetic gastroparesis (HCC)   AKI (acute kidney injury) ([place])   Anemia, iron deficiency   GERD (gastroesophageal reflux disease)   DKA (diabetic ketoacidoses) ([place])   1. DKA, moderate-severe, in type 1 DM  - Presented with serum glucose 580, pH 7.17, and positive urine ketones  - Strongly suspect noncompliance with diabetic management - A1c was 10.3% earlier this month -Serum glucose normalized with insulin drip, anion gap has since closed -Patient has been transitioned to subcutaneous insulin -As patient is off of insulin drip, will transfer patient to medical floor  2. AKI  - SCr 1.74 on admission, improved to 1.04 today - Likely a prerenal azotemia in the setting of DKA  - We will repeat basic metabolic panel in morning  3. Diabetic gastroparesis  - Improved with reglan 10 mg with meals this admission   -Diet has been successfully advanced to carb modified  4. Iron deficiency with Microcytic anemia   - Hgb 8.4 on admission with MCV 73.4  - CBC this morning reviewed. Patient with a hemoglobin of 6.9.  - 2 units of PRBCs ordered and are currently being given. We'll have to monitor closely for transfusion reactions which have the potential of being serious or even life-threatening - Denies melena or hematochezia; EGC normal in 2010  - Low iron of <5 -Given 1 dose of IV iron. -Hemoccult this morning was ordered. Negative for occult blood.  5. Anxiety, depression - Given Haldol and Ativan in ED - Continue Buspar as tolerated -Patient does report feeling  large amounts of stress prior to this hospital admission, largely related to issues with housing and finances  6. Leukocytosis, thrombocytosis - WBC 12,600 and platelets 523,000 on admission, likely reactive in setting of DKA  - Resolved with treatment of DKA   DVT prophylaxis: Heparin subq Code Status: Full Family Communication: Pt in room, family not at bedside Disposition Plan: Uncertain at this time  Consultants:   Diabetic coordinator  Procedures:     Antimicrobials: Anti-infectives    None      Subjective: Patient without complaints today  Objective: Vitals:   03/24/16 0900 03/24/16 1000 03/24/16 1319 03/24/16 1421  BP:  (!) 111/45 127/67 (!) 130/57  Pulse: 96 95 88 99  Resp: (!) 33 (!) 31 (!) 28 (!) 24  Temp:   98.4 F (36.9 C) 98.3 F (36.8 C)  TempSrc:   Oral Oral  SpO2: 100% 100% 100% 100%  Weight:      Height:        Intake/Output Summary (Last 24 hours) at 03/24/16 1616 Last data filed at 03/24/16 1422  Gross per 24 hour  Intake          2980.16 ml  Output              400 ml  Net          2580.16 ml   Filed Weights   03/22/16 2358 03/23/16 0304  Weight: 65.3 kg (144 lb) 64.7 kg (142 lb 10.2 oz)    Examination:  General exam: Sitting  bed, no acute distress Respiratory system: Normal respiratory effort, no audible wheezing. Cardiovascular system: Regular rate and rhythm, S1-S2 Gastrointestinal system: Soft, positive bowel sounds Central nervous system: Cranial nerves II-12 grossly intact, strength and sensation intact Extremities: Perfused, no clubbing. Skin: Normal skin turgor, no notable skin lesions seen Psychiatry: Appears somewhat depressed, no visual hallucinations  Data Reviewed: I have personally reviewed following labs and imaging studies  CBC:  Recent Labs Lab 03/18/16 1023 03/21/16 2111 03/22/16 2351 03/24/16 0500  WBC 13.2* 5.5 12.6* 7.3  NEUTROABS 12.1* 4.1 10.4*  --   HGB 10.8* 9.3* 8.4* 6.9*  HCT 37.2 30.9*  29.0* 22.1*  MCV 74.8* 70.7* 73.4* 67.6*  PLT 487* 369 523* 0000000   Basic Metabolic Panel:  Recent Labs Lab 03/22/16 2352 03/23/16 0210 03/23/16 0550 03/23/16 0835 03/23/16 1315 03/24/16 0500  NA  --  135 139 137 136 137  K  --  4.7 3.9 4.6 3.7 3.3*  CL  --  109 117* 116* 114* 114*  CO2  --  <7* 10* 13* 12* 19*  GLUCOSE  --  471* 170* 107* 125* 136*  BUN  --  25* 24* 22* 19 10  CREATININE  --  1.59* 1.30* 1.22* 1.05* 1.04*  CALCIUM  --  7.9* 7.8* 7.7* 7.8* 7.8*  MG 2.3  --   --   --   --   --    GFR: Estimated Creatinine Clearance: 58.4 mL/min (by C-G formula based on SCr of 1.04 mg/dL (H)). Liver Function Tests:  Recent Labs Lab 03/21/16 2111  AST 25  ALT 17  ALKPHOS 74  BILITOT 1.4*  PROT 7.5  ALBUMIN 3.7    Recent Labs Lab 03/21/16 2111  LIPASE 31   No results for input(s): AMMONIA in the last 168 hours. Coagulation Profile: No results for input(s): INR, PROTIME in the last 168 hours. Cardiac Enzymes:  Recent Labs Lab 03/23/16 0210  TROPONINI <0.03   BNP (last 3 results) No results for input(s): PROBNP in the last 8760 hours. HbA1C: No results for input(s): HGBA1C in the last 72 hours. CBG:  Recent Labs Lab 03/23/16 1502 03/23/16 1607 03/23/16 1654 03/23/16 2200 03/24/16 1207  GLUCAP 145* 158* 154* 155* 185*   Lipid Profile: No results for input(s): CHOL, HDL, LDLCALC, TRIG, CHOLHDL, LDLDIRECT in the last 72 hours. Thyroid Function Tests: No results for input(s): TSH, T4TOTAL, FREET4, T3FREE, THYROIDAB in the last 72 hours. Anemia Panel:  Recent Labs  03/23/16 0210  VITAMINB12 189  FERRITIN 10*  TIBC 431  IRON <5*   Sepsis Labs:  Recent Labs Lab 03/23/16 0210 03/23/16 0550  LATICACIDVEN 3.0* 1.3    Recent Results (from the past 240 hour(s))  MRSA PCR Screening     Status: None   Collection Time: 03/18/16  3:58 PM  Result Value Ref Range Status   MRSA by PCR NEGATIVE NEGATIVE Final    Comment:        The GeneXpert MRSA  Assay (FDA approved for NASAL specimens only), is one component of a comprehensive MRSA colonization surveillance program. It is not intended to diagnose MRSA infection nor to guide or monitor treatment for MRSA infections.   MRSA PCR Screening     Status: None   Collection Time: 03/23/16  3:08 AM  Result Value Ref Range Status   MRSA by PCR NEGATIVE NEGATIVE Final    Comment:        The GeneXpert MRSA Assay (FDA approved for NASAL specimens only), is  one component of a comprehensive MRSA colonization surveillance program. It is not intended to diagnose MRSA infection nor to guide or monitor treatment for MRSA infections.      Radiology Studies: Dg Chest Portable 1 View  Result Date: 03/22/2016 CLINICAL DATA:  Central line placement.  Initial encounter. EXAM: PORTABLE CHEST 1 VIEW COMPARISON:  Chest radiograph from 01/19/2016 FINDINGS: No central line is seen. The lungs are well-aerated and clear. There is no evidence of focal opacification, pleural effusion or pneumothorax. Bilateral nipple shadows are noted. The cardiomediastinal silhouette is within normal limits. No acute osseous abnormalities are seen. IMPRESSION: No central line seen.  No acute cardiopulmonary process identified. Electronically Signed   By: Garald Balding M.D.   On: 03/22/2016 23:56    Scheduled Meds: . sodium chloride   Intravenous Once  . busPIRone  5 mg Oral TID  . famotidine  20 mg Oral BID  . heparin  5,000 Units Subcutaneous Q8H  . insulin aspart  0-5 Units Subcutaneous QHS  . insulin aspart  0-9 Units Subcutaneous TID WC  . insulin aspart  3 Units Subcutaneous TID WC  . insulin detemir  15 Units Subcutaneous QHS  . LORazepam  0.5 mg Intravenous Once  . metoCLOPramide  10 mg Oral TID AC  . pregabalin  150 mg Oral BID   Continuous Infusions: . sodium chloride Stopped (03/23/16 0535)  . dextrose 5 % and 0.45% NaCl 75 mL/hr at 03/24/16 1100  . insulin (NOVOLIN-R) infusion Stopped (03/23/16  1650)     LOS: 1 day   Kit Brubacher, Orpah Melter, MD [facility] Pager [phone]  If 7PM-7AM, please contact night-coverage [email or web address] Password [email or web address] 03/24/2016, 4:16 PM

## 2016-03-25 LAB — CBC
HCT: 22.1 % — ABNORMAL LOW (ref 36.0–46.0)
HCT: 26.3 % — ABNORMAL LOW (ref 36.0–46.0)
HEMOGLOBIN: 6.9 g/dL — AB (ref 12.0–15.0)
Hemoglobin: 8.3 g/dL — ABNORMAL LOW (ref 12.0–15.0)
MCH: 21.1 pg — ABNORMAL LOW (ref 26.0–34.0)
MCH: 22.7 pg — ABNORMAL LOW (ref 26.0–34.0)
MCHC: 31.2 g/dL (ref 30.0–36.0)
MCHC: 31.6 g/dL (ref 30.0–36.0)
MCV: 67.6 fL — ABNORMAL LOW (ref 78.0–100.0)
MCV: 71.9 fL — ABNORMAL LOW (ref 78.0–100.0)
PLATELETS: 293 10*3/uL (ref 150–400)
Platelets: 257 10*3/uL (ref 150–400)
RBC: 3.27 MIL/uL — AB (ref 3.87–5.11)
RBC: 3.66 MIL/uL — ABNORMAL LOW (ref 3.87–5.11)
RDW: 16.4 % — ABNORMAL HIGH (ref 11.5–15.5)
RDW: 18.5 % — AB (ref 11.5–15.5)
WBC: 7.3 10*3/uL (ref 4.0–10.5)
WBC: 4.4 10*3/uL (ref 4.0–10.5)

## 2016-03-25 LAB — TYPE AND SCREEN
ABO/RH(D): O POS
Antibody Screen: NEGATIVE
Unit division: 0

## 2016-03-25 LAB — GLUCOSE, CAPILLARY
GLUCOSE-CAPILLARY: 176 mg/dL — AB (ref 65–99)
Glucose-Capillary: 290 mg/dL — ABNORMAL HIGH (ref 65–99)
Glucose-Capillary: 344 mg/dL — ABNORMAL HIGH (ref 65–99)

## 2016-03-25 LAB — BASIC METABOLIC PANEL
Anion gap: 5 (ref 5–15)
BUN: 10 mg/dL (ref 6–20)
CALCIUM: 8.1 mg/dL — AB (ref 8.9–10.3)
CO2: 19 mmol/L — ABNORMAL LOW (ref 22–32)
CREATININE: 1.06 mg/dL — AB (ref 0.44–1.00)
Chloride: 113 mmol/L — ABNORMAL HIGH (ref 101–111)
GFR calc non Af Amer: >60 mL/min (ref >60)
Glucose, Bld: 140 mg/dL — ABNORMAL HIGH (ref 65–99)
Potassium: 3.8 mmol/L (ref 3.5–5.1)
SODIUM: 137 mmol/L (ref 135–145)

## 2016-03-25 MED ORDER — FERROUS SULFATE 325 (65 FE) MG PO TABS
325.0000 mg | ORAL_TABLET | Freq: Every day | ORAL | Status: DC
  Administered 2016-03-26: 325 mg via ORAL
  Filled 2016-03-25: qty 1

## 2016-03-25 MED ORDER — DOCUSATE SODIUM 100 MG PO CAPS
100.0000 mg | ORAL_CAPSULE | Freq: Two times a day (BID) | ORAL | Status: DC
  Administered 2016-03-25 – 2016-03-26 (×2): 100 mg via ORAL
  Filled 2016-03-25 (×2): qty 1

## 2016-03-25 NOTE — Progress Notes (Signed)
PROGRESS NOTE    [name]  [id number] DOB: 1965/11/12 DOA: 03/22/2016 PCP: [name], MD    Brief Narrative:  50yo female with hx of DM who presents with recurrent DKA after recent hospital discharge for same.  Assessment & Plan:   Principal Problem:   Diabetic ketoacidosis without coma associated with type 1 diabetes mellitus (HCC) Active Problems:   Nausea & vomiting   Anxiety and depression   Diabetic gastroparesis (HCC)   AKI (acute kidney injury) ([place])   Anemia, iron deficiency   GERD (gastroesophageal reflux disease)   DKA (diabetic ketoacidoses) ([place])   1. DKA, moderate-severe, in type 1 DM  - Presented with serum glucose 580, pH 7.17, and positive urine ketones  - Suspect noncompliance with diabetic management - A1c was 10.3% earlier this month -Serum glucose has improved markedly this admission -Appreciate input by Diabetic Coordinator -Patient now on 15 units of levemir with 3 units pre-meal aspart -Will greatly benefit from outpatient Endocrinology follow-up, however insurance issues present an issue -Will address with case management staff  2. AKI  - SCr 1.74 on admission, improved to 1.04 today - Likely a prerenal azotemia in the setting of DKA  - Improved -Will recheck basic metabolic panel in AM  3. Diabetic gastroparesis  - Improved with reglan 10 mg with meals this admission   -Diet has now on carb modified  4. Iron deficiency with Microcytic anemia   - Hgb 8.4 on admission with MCV 73.4  - CBC this morning reviewed. Patient with a hemoglobin of 6.9.  - 2 units of PRBCs ordered and are currently being given. We'll have to monitor closely for transfusion reactions which have the potential of being serious or even life-threatening - Denies melena or hematochezia; EGD reportedly Unremarkable in 2010  - Low iron of <5 -Given 1 dose of IV iron. -CT abd from 2016 personally reviewed. No acute process identified on that study  5.  Anxiety, depression - Continued on Buspar as tolerated - Patient continues to report life stresses, namely financial issues  6. Leukocytosis, thrombocytosis - WBC 12,600 and platelets 523,000 on presentation, likely reactive in setting of DKA  - Resolved  DVT prophylaxis: Heparin subq Code Status: Full Family Communication: Pt in room, family not at bedside Disposition Plan: Uncertain at this time  Consultants:   Diabetic coordinator  Procedures:     Antimicrobials: Anti-infectives    None      Subjective: No complaints. Denies chest pains  Objective: Vitals:   03/24/16 1850 03/24/16 2119 03/25/16 0547 03/25/16 1357  BP: 138/65 132/69 139/78 (!) 142/77  Pulse: 100 96 87 85  Resp: 20 18 16 14   Temp: 99.6 F (37.6 C) 98.7 F (37.1 C) 98 F (36.7 C) 98 F (36.7 C)  TempSrc: Oral Oral Oral Oral  SpO2: 100% 97% 95% 100%  Weight:      Height:        Intake/Output Summary (Last 24 hours) at 03/25/16 1745 Last data filed at 03/25/16 1606  Gross per 24 hour  Intake           3013.5 ml  Output                0 ml  Net           3013.5 ml   Filed Weights   03/22/16 2358 03/23/16 0304  Weight: 65.3 kg (144 lb) 64.7 kg (142 lb 10.2 oz)    Examination:  General exam: Sitting in chair,  conversant Respiratory system: good chest rise, no crackles Cardiovascular system: regular rhythm, s1, s2 Gastrointestinal system: no masses, pos BS Central nervous system: no seizures, no tremors Extremities: no cyanosis, no joint deformities Skin: no rashes, no palor Psychiatry: normal mood, no visual hallucinations  Data Reviewed: I have personally reviewed following labs and imaging studies  CBC:  Recent Labs Lab 03/21/16 2111 03/22/16 2351 03/24/16 0500 03/25/16 0500  WBC 5.5 12.6* 7.3 4.4  NEUTROABS 4.1 10.4*  --   --   HGB 9.3* 8.4* 6.9* 8.3*  HCT 30.9* 29.0* 22.1* 26.3*  MCV 70.7* 73.4* 67.6* 71.9*  PLT 369 523* 293 [id number]   Basic Metabolic Panel:  Recent  Labs Lab 03/22/16 2352  03/23/16 0550 03/23/16 0835 03/23/16 1315 03/24/16 0500 03/25/16 0500  NA  --   < > 139 137 136 137 137  K  --   < > 3.9 4.6 3.7 3.3* 3.8  CL  --   < > 117* 116* 114* 114* 113*  CO2  --   < > 10* 13* 12* 19* 19*  GLUCOSE  --   < > 170* 107* 125* 136* 140*  BUN  --   < > 24* 22* 19 10 10   CREATININE  --   < > 1.30* 1.22* 1.05* 1.04* 1.06*  CALCIUM  --   < > 7.8* 7.7* 7.8* 7.8* 8.1*  MG 2.3  --   --   --   --   --   --   < > = values in this interval not displayed. GFR: Estimated Creatinine Clearance: 57.3 mL/min (by C-G formula based on SCr of 1.06 mg/dL (H)). Liver Function Tests:  Recent Labs Lab 03/21/16 2111  AST 25  ALT 17  ALKPHOS 74  BILITOT 1.4*  PROT 7.5  ALBUMIN 3.7    Recent Labs Lab 03/21/16 2111  LIPASE 31   No results for input(s): AMMONIA in the last 168 hours. Coagulation Profile: No results for input(s): INR, PROTIME in the last 168 hours. Cardiac Enzymes:  Recent Labs Lab 03/23/16 0210  TROPONINI <0.03   BNP (last 3 results) No results for input(s): PROBNP in the last 8760 hours. HbA1C: No results for input(s): HGBA1C in the last 72 hours. CBG:  Recent Labs Lab 03/24/16 1207 03/24/16 1710 03/24/16 1826 03/24/16 2235 03/25/16 0830  GLUCAP 185* 448* 374* 129* 176*   Lipid Profile: No results for input(s): CHOL, HDL, LDLCALC, TRIG, CHOLHDL, LDLDIRECT in the last 72 hours. Thyroid Function Tests: No results for input(s): TSH, T4TOTAL, FREET4, T3FREE, THYROIDAB in the last 72 hours. Anemia Panel:  Recent Labs  03/23/16 0210  VITAMINB12 189  FERRITIN 10*  TIBC 431  IRON <5*   Sepsis Labs:  Recent Labs Lab 03/23/16 0210 03/23/16 0550  LATICACIDVEN 3.0* 1.3    Recent Results (from the past 240 hour(s))  MRSA PCR Screening     Status: None   Collection Time: 03/18/16  3:58 PM  Result Value Ref Range Status   MRSA by PCR NEGATIVE NEGATIVE Final    Comment:        The GeneXpert MRSA Assay  (FDA approved for NASAL specimens only), is one component of a comprehensive MRSA colonization surveillance program. It is not intended to diagnose MRSA infection nor to guide or monitor treatment for MRSA infections.   MRSA PCR Screening     Status: None   Collection Time: 03/23/16  3:08 AM  Result Value Ref Range Status   MRSA by PCR  NEGATIVE NEGATIVE Final    Comment:        The GeneXpert MRSA Assay (FDA approved for NASAL specimens only), is one component of a comprehensive MRSA colonization surveillance program. It is not intended to diagnose MRSA infection nor to guide or monitor treatment for MRSA infections.      Radiology Studies: No results found.  Scheduled Meds: . sodium chloride   Intravenous Once  . busPIRone  5 mg Oral TID  . famotidine  20 mg Oral BID  . heparin  5,000 Units Subcutaneous Q8H  . insulin aspart  0-5 Units Subcutaneous QHS  . insulin aspart  0-9 Units Subcutaneous TID WC  . insulin aspart  3 Units Subcutaneous TID WC  . insulin detemir  15 Units Subcutaneous QHS  . LORazepam  0.5 mg Intravenous Once  . metoCLOPramide  10 mg Oral TID AC  . pregabalin  150 mg Oral BID   Continuous Infusions:     LOS: 2 days   [name], Orpah Melter, MD [facility] Pager [phone]  If 7PM-7AM, please contact night-coverage [email or web address] Password [email or web address] 03/25/2016, 5:45 PM

## 2016-03-26 LAB — GLUCOSE, CAPILLARY
GLUCOSE-CAPILLARY: 191 mg/dL — AB (ref 65–99)
GLUCOSE-CAPILLARY: 137 mg/dL — AB (ref 65–99)
Glucose-Capillary: 152 mg/dL — ABNORMAL HIGH (ref 65–99)
Glucose-Capillary: 179 mg/dL — ABNORMAL HIGH (ref 65–99)
Glucose-Capillary: 262 mg/dL — ABNORMAL HIGH (ref 65–99)

## 2016-03-26 LAB — BASIC METABOLIC PANEL
ANION GAP: 4 — AB (ref 5–15)
BUN: 13 mg/dL (ref 6–20)
CALCIUM: 8.4 mg/dL — AB (ref 8.9–10.3)
CO2: 23 mmol/L (ref 22–32)
Chloride: 109 mmol/L (ref 101–111)
Creatinine, Ser: 0.96 mg/dL (ref 0.44–1.00)
Glucose, Bld: 156 mg/dL — ABNORMAL HIGH (ref 65–99)
Potassium: 3.6 mmol/L (ref 3.5–5.1)
SODIUM: 136 mmol/L (ref 135–145)

## 2016-03-26 LAB — CBC
HCT: 27.1 % — ABNORMAL LOW (ref 36.0–46.0)
HEMOGLOBIN: 8.7 g/dL — AB (ref 12.0–15.0)
MCH: 23.3 pg — AB (ref 26.0–34.0)
MCHC: 32.1 g/dL (ref 30.0–36.0)
MCV: 72.5 fL — ABNORMAL LOW (ref 78.0–100.0)
Platelets: 277 10*3/uL (ref 150–400)
RBC: 3.74 MIL/uL — AB (ref 3.87–5.11)
RDW: 18.6 % — ABNORMAL HIGH (ref 11.5–15.5)
WBC: 3.9 10*3/uL — ABNORMAL LOW (ref 4.0–10.5)

## 2016-03-26 LAB — URINALYSIS, ROUTINE W REFLEX MICROSCOPIC
BILIRUBIN URINE: NEGATIVE
GLUCOSE, UA: 250 mg/dL — AB
Ketones, ur: NEGATIVE mg/dL
Leukocytes, UA: NEGATIVE
Nitrite: NEGATIVE
PH: 6 (ref 5.0–8.0)
Protein, ur: NEGATIVE mg/dL
SPECIFIC GRAVITY, URINE: 1.015 (ref 1.005–1.030)

## 2016-03-26 LAB — URINE MICROSCOPIC-ADD ON

## 2016-03-26 MED ORDER — METOCLOPRAMIDE HCL 10 MG PO TABS: 10.0000 mg | ORAL_TABLET | Freq: Three times a day (TID) | ORAL | 0 refills | Status: DC

## 2016-03-26 MED ORDER — SODIUM CHLORIDE 0.9% FLUSH: 10.0000 mL | INTRAVENOUS | Status: DC | PRN

## 2016-03-26 MED ORDER — INSULIN ASPART 100 UNIT/ML ~~LOC~~ SOLN: 4.0000 [IU] | Freq: Three times a day (TID) | SUBCUTANEOUS | 11 refills | Status: DC

## 2016-03-26 MED ORDER — DOCUSATE SODIUM 250 MG PO CAPS: 250.0000 mg | ORAL_CAPSULE | Freq: Two times a day (BID) | ORAL | 0 refills | Status: DC

## 2016-03-26 MED ORDER — INSULIN DETEMIR 100 UNIT/ML ~~LOC~~ SOLN: 20.0000 [IU] | Freq: Every day | SUBCUTANEOUS | 11 refills | Status: DC

## 2016-03-26 MED ORDER — FLUCONAZOLE 100 MG PO TABS
150.0000 mg | ORAL_TABLET | Freq: Once | ORAL | Status: AC
  Administered 2016-03-26: 150 mg via ORAL
  Filled 2016-03-26: qty 2

## 2016-03-26 MED ORDER — FERROUS SULFATE 325 (65 FE) MG PO TABS: 325.0000 mg | ORAL_TABLET | Freq: Every day | ORAL | 0 refills | Status: DC

## 2016-03-26 NOTE — Discharge Summary (Signed)
Physician Discharge Summary  Dezariah Masino [id number] DOB: Feb 25, 1966 DOA: 03/22/2016  PCP: [name], MD  Admit date: 03/22/2016 Discharge date: 03/26/2016  Admitted From: Home Disposition:  Home  Recommendations for Outpatient Follow-up:  1. Follow up with PCP in 1-2 weeks 2. Please obtain BMP/CBC within one week 3. Recommend referral to Endocrinologist for diabetes 4. Recommend referral to Hematologist for iron deficiency anemia  Discharge Condition:Improved CODE STATUS:Full Diet recommendation: Diabetic   Brief/Interim Summary: Please review dictated h and p from 9/29 for details. Briefly, 50yo female with hx of DM who presents with recurrent DKA after recent hospital discharge for same.  1. DKA, moderate-severe, in type 1 DM  - Presented with serum glucose 580, pH 7.17, and positive urine ketones  - Suspect noncompliance with diabetic management - A1c was 10.3% earlier this month - Serum glucose has improved markedly this admission - Appreciate input by Diabetic Coordinator - Patient now on 15 units of levemir with 3 units pre-meal aspart - Discussed case with diabetic coordinator who recommends discharge with 20 units levemir with 4 units of pre-meal aspart with continued SSI coverage as needed - Will greatly benefit from outpatient Endocrinology follow-up, however insurance issues present an issue  2. AKI  - SCr 1.74 on admission, improved to 1.04 today - Likely a prerenal azotemia in the setting of DKA  - Improved  3. Diabetic gastroparesis  - Improved with reglan 10 mg with meals this admission  -Diet has now on carb modified - Will prescribe reglan at discharge  4. Iron deficiency with Microcytic anemia  - Hgb 8.4 on admission with MCV 73.4  - CBC this morning reviewed. Patient with a hemoglobin of 6.9.  - 2 units of PRBCs ordered and are currently being given. We'll have to monitor closely for transfusion reactions which have the potential of being  serious or even life-threatening - Denies melena or hematochezia; EGD reportedly Unremarkable in 2010  - Stools are heme neg - Low iron of <5 -Given 1 dose of IV iron. -CT abd from 2016 personally reviewed. No acute process identified on that study -Will discharge with PO iron supplements -Recommend referral to hematologist for eval of iron deficiency anemia  5. Anxiety, depression - Continued on Buspar as tolerated - Patient continues to report life stresses, namely financial issues  6. Leukocytosis, thrombocytosis - WBC 12,600 and platelets 523,000 on presentation, likely reactive in setting of DKA  - Resolved  7. Vaginal yeast infection - Patient reports symptoms c/w vaginal yeast - Given one dose of PO diflucan  Discharge Diagnoses:  Principal Problem:   Diabetic ketoacidosis without coma associated with type 1 diabetes mellitus ([place]) Active Problems:   Nausea & vomiting   Anxiety and depression   Diabetic gastroparesis (HCC)   AKI (acute kidney injury) ([place])   Anemia, iron deficiency   GERD (gastroesophageal reflux disease)   DKA (diabetic ketoacidoses) ([place])    Discharge Instructions     Medication List    TAKE these medications   albuterol 108 (90 Base) MCG/ACT inhaler Commonly known as:  PROVENTIL HFA;VENTOLIN HFA Inhale 2 puffs into the lungs every 6 (six) hours as needed for wheezing or shortness of breath.   busPIRone 5 MG tablet Commonly known as:  BUSPAR Take 1 tablet (5 mg total) by mouth 3 (three) times daily.   docusate sodium 250 MG capsule Commonly known as:  COLACE Take 1 capsule (250 mg total) by mouth 2 (two) times daily.   erythromycin 250 MG tablet  Commonly known as:  E-MYCIN Take 1 tablet (250 mg total) by mouth 3 (three) times daily with meals as needed.   ferrous sulfate 325 (65 FE) MG tablet Take 1 tablet (325 mg total) by mouth daily with breakfast. Start taking on:  03/27/2016   furosemide 40 MG tablet Commonly known as:   LASIX Take 40 mg by mouth daily as needed for fluid.   glucose blood test strip Commonly known as:  TRUE METRIX BLOOD GLUCOSE TEST Use 3 times daily before meals   insulin aspart 100 UNIT/ML injection Commonly known as:  novoLOG Inject 0-15 Units into the skin 3 (three) times daily with meals. Per sliding scale What changed:  Another medication with the same name was added. Make sure you understand how and when to take each.   insulin aspart 100 UNIT/ML injection Commonly known as:  NOVOLOG Inject 4 Units into the skin 3 (three) times daily with meals. What changed:  You were already taking a medication with the same name, and this prescription was added. Make sure you understand how and when to take each.   insulin detemir 100 UNIT/ML injection Commonly known as:  LEVEMIR Inject 0.2 mLs (20 Units total) into the skin at bedtime. What changed:  how much to take   Insulin Syringe-Needle U-100 31G X 15/64" 0.5 ML Misc Commonly known as:  BD INSULIN SYRINGE ULTRAFINE 1 each by Does not apply route 4 (four) times daily as needed.   metoCLOPramide 10 MG tablet Commonly known as:  REGLAN Take 1 tablet (10 mg total) by mouth 3 (three) times daily before meals.   ondansetron 4 MG disintegrating tablet Commonly known as:  ZOFRAN ODT Take 1 tablet (4 mg total) by mouth every 8 (eight) hours as needed for nausea.   polyethylene glycol packet Commonly known as:  MIRALAX / GLYCOLAX Take 17 g by mouth daily as needed for mild constipation.   pregabalin 150 MG capsule Commonly known as:  LYRICA Take 1 capsule (150 mg total) by mouth 2 (two) times daily.   TRUE METRIX METER Devi 1 each by Does not apply route 3 (three) times daily before meals.      Follow-up Information    [facility]. Go on 03/28/2016.   Why:  be there at 11:00am Contact information: [place] [phone]         Allergies   Allergen Reactions  . Anesthetics, Amide Nausea And Vomiting  . Penicillins Diarrhea and Nausea And Vomiting    Has patient had a PCN reaction causing immediate rash, facial/tongue/throat swelling, SOB or lightheadedness with hypotension: No Has patient had a PCN reaction causing severe rash involving mucus membranes or skin necrosis: No Has patient had a PCN reaction that required hospitalization No Has patient had a PCN reaction occurring within the last 10 years: Yes  If all of the above answers are "NO", then may proceed with Cephalosporin use.   . Buprenorphine Hcl Rash  . Encainide Nausea And Vomiting    Procedures/Studies: Dg Chest Portable 1 View  Result Date: 03/22/2016 CLINICAL DATA:  Central line placement.  Initial encounter. EXAM: PORTABLE CHEST 1 VIEW COMPARISON:  Chest radiograph from 01/19/2016 FINDINGS: No central line is seen. The lungs are well-aerated and clear. There is no evidence of focal opacification, pleural effusion or pneumothorax. Bilateral nipple shadows are noted. The cardiomediastinal silhouette is within normal limits. No acute osseous abnormalities are seen. IMPRESSION: No central line  seen.  No acute cardiopulmonary process identified. Electronically Signed   By: Garald Balding M.D.   On: 03/22/2016 23:56    Subjective: Eager to go home today  Discharge Exam: Vitals:   03/25/16 2056 03/26/16 0529  BP: 131/64 127/72  Pulse: 85 84  Resp: 16 16  Temp: 98.2 F (36.8 C) 98 F (36.7 C)   Vitals:   03/25/16 0547 03/25/16 1357 03/25/16 2056 03/26/16 0529  BP: 139/78 (!) 142/77 131/64 127/72  Pulse: 87 85 85 84  Resp: 16 14 16 16   Temp: 98 F (36.7 C) 98 F (36.7 C) 98.2 F (36.8 C) 98 F (36.7 C)  TempSrc: Oral Oral Oral Oral  SpO2: 95% 100% 100% 95%  Weight:      Height:        General: Pt is alert, awake, not in acute distress Cardiovascular: RRR, S1/S2 +, no rubs, no gallops Respiratory: CTA bilaterally, no wheezing, no  rhonchi Abdominal: Soft, NT, ND, bowel sounds + Extremities: no edema, no cyanosis   The results of significant diagnostics from this hospitalization (including imaging, microbiology, ancillary and laboratory) are listed below for reference.     Microbiology: Recent Results (from the past 240 hour(s))  MRSA PCR Screening     Status: None   Collection Time: 03/18/16  3:58 PM  Result Value Ref Range Status   MRSA by PCR NEGATIVE NEGATIVE Final    Comment:        The GeneXpert MRSA Assay (FDA approved for NASAL specimens only), is one component of a comprehensive MRSA colonization surveillance program. It is not intended to diagnose MRSA infection nor to guide or monitor treatment for MRSA infections.   MRSA PCR Screening     Status: None   Collection Time: 03/23/16  3:08 AM  Result Value Ref Range Status   MRSA by PCR NEGATIVE NEGATIVE Final    Comment:        The GeneXpert MRSA Assay (FDA approved for NASAL specimens only), is one component of a comprehensive MRSA colonization surveillance program. It is not intended to diagnose MRSA infection nor to guide or monitor treatment for MRSA infections.      Labs: BNP (last 3 results) No results for input(s): BNP in the last 8760 hours. Basic Metabolic Panel:  Recent Labs Lab 03/22/16 2352  03/23/16 0835 03/23/16 1315 03/24/16 0500 03/25/16 0500 03/26/16 0833  NA  --   < > 137 136 137 137 136  K  --   < > 4.6 3.7 3.3* 3.8 3.6  CL  --   < > 116* 114* 114* 113* 109  CO2  --   < > 13* 12* 19* 19* 23  GLUCOSE  --   < > 107* 125* 136* 140* 156*  BUN  --   < > 22* 19 10 10 13   CREATININE  --   < > 1.22* 1.05* 1.04* 1.06* 0.96  CALCIUM  --   < > 7.7* 7.8* 7.8* 8.1* 8.4*  MG 2.3  --   --   --   --   --   --   < > = values in this interval not displayed. Liver Function Tests:  Recent Labs Lab 03/21/16 2111  AST 25  ALT 17  ALKPHOS 74  BILITOT 1.4*  PROT 7.5  ALBUMIN 3.7    Recent Labs Lab 03/21/16 2111   LIPASE 31   No results for input(s): AMMONIA in the last 168 hours. CBC:  Recent Labs  Lab 03/21/16 2111 03/22/16 2351 03/24/16 0500 03/25/16 0500 03/26/16 0658  WBC 5.5 12.6* 7.3 4.4 3.9*  NEUTROABS 4.1 10.4*  --   --   --   HGB 9.3* 8.4* 6.9* 8.3* 8.7*  HCT 30.9* 29.0* 22.1* 26.3* 27.1*  MCV 70.7* 73.4* 67.6* 71.9* 72.5*  PLT 369 523* 293 257 277   Cardiac Enzymes:  Recent Labs Lab 03/23/16 0210  TROPONINI <0.03   BNP: Invalid input(s): POCBNP CBG:  Recent Labs Lab 03/25/16 1219 03/25/16 1744 03/25/16 2055 03/26/16 0750 03/26/16 1156  GLUCAP 191* 290* 344* 137* 179*   D-Dimer No results for input(s): DDIMER in the last 72 hours. Hgb A1c No results for input(s): HGBA1C in the last 72 hours. Lipid Profile No results for input(s): CHOL, HDL, LDLCALC, TRIG, CHOLHDL, LDLDIRECT in the last 72 hours. Thyroid function studies No results for input(s): TSH, T4TOTAL, T3FREE, THYROIDAB in the last 72 hours.  Invalid input(s): FREET3 Anemia work up No results for input(s): VITAMINB12, FOLATE, FERRITIN, TIBC, IRON, RETICCTPCT in the last 72 hours. Urinalysis    Component Value Date/Time   COLORURINE YELLOW 03/26/2016 0958   APPEARANCEUR CLEAR 03/26/2016 0958   LABSPEC 1.015 03/26/2016 0958   PHURINE 6.0 03/26/2016 0958   GLUCOSEU 250 (A) 03/26/2016 0958   HGBUR TRACE (A) 03/26/2016 0958   BILIRUBINUR NEGATIVE 03/26/2016 0958   BILIRUBINUR NEGATIVE 08/04/2012 1005   KETONESUR NEGATIVE 03/26/2016 0958   PROTEINUR NEGATIVE 03/26/2016 0958   UROBILINOGEN 0.2 04/27/2015 1608   NITRITE NEGATIVE 03/26/2016 0958   LEUKOCYTESUR NEGATIVE 03/26/2016 0958   Sepsis Labs Invalid input(s): PROCALCITONIN,  WBC,  LACTICIDVEN Microbiology Recent Results (from the past 240 hour(s))  MRSA PCR Screening     Status: None   Collection Time: 03/18/16  3:58 PM  Result Value Ref Range Status   MRSA by PCR NEGATIVE NEGATIVE Final    Comment:        The GeneXpert MRSA Assay  (FDA approved for NASAL specimens only), is one component of a comprehensive MRSA colonization surveillance program. It is not intended to diagnose MRSA infection nor to guide or monitor treatment for MRSA infections.   MRSA PCR Screening     Status: None   Collection Time: 03/23/16  3:08 AM  Result Value Ref Range Status   MRSA by PCR NEGATIVE NEGATIVE Final    Comment:        The GeneXpert MRSA Assay (FDA approved for NASAL specimens only), is one component of a comprehensive MRSA colonization surveillance program. It is not intended to diagnose MRSA infection nor to guide or monitor treatment for MRSA infections.      SIGNED:   Donne Hazel, MD  [facility] 03/26/2016, 2:32 PM  If 7PM-7AM, please contact night-coverage [email or web address] Password [email or web address]

## 2016-03-26 NOTE — Progress Notes (Signed)
Patient given soap sud enema with good result. Had medium size soft stool.

## 2016-03-26 NOTE — Plan of Care (Signed)
Problem: Food- and Nutrition-Related Knowledge Deficit (NB-1.1) Goal: Nutrition education Formal process to instruct or train a patient/client in a skill or to impart knowledge to help patients/clients voluntarily manage or modify food choices and eating behavior to maintain or improve health. Outcome: Completed/Met Date Met: 03/26/16  RD consulted for nutrition education regarding diabetes and gastroparesis.  Lab Results  Component Value Date   HGBA1C 10.3 (H) 03/19/2016    RD provided "Carbohydrate Counting for People with Diabetes" and "Gastroparesis Nutrition therapy" handouts from the Academy of Nutrition and Dietetics. Also, provided "MyPlate" handout. Discussed different food groups and their effects on blood sugar, emphasizing carbohydrate-containing foods. Provided list of carbohydrates and recommended serving sizes of common foods.  Discussed importance of controlled and consistent carbohydrate intake throughout the day. Provided examples of ways to balance meals/snacks and encouraged intake of high-fiber, whole grain complex carbohydrates. Teach back method used.  Expect fair compliance. Patient seemed overwhelmed by amount of information provided. Pt with very little prior knowledge about what foods constitute as carbohydrates. Encouraged pt to read over handouts and request RD with any further questions.  Body mass index is 26.09 kg/m. Pt meets criteria for overweight based on current BMI.  Current diet order is CHO modified, patient is consuming approximately 100% of meals at this time. Labs and medications reviewed. No further nutrition interventions warranted at this time. RD contact information provided. If additional nutrition issues arise, please re-consult RD.  Clayton Bibles, MS, RD, LDN Pager: [phone] After Hours Pager: [phone]

## 2016-03-26 NOTE — Progress Notes (Signed)
Inpatient Diabetes Program Recommendations  AACE/ADA: New Consensus Statement on Inpatient Glycemic Control (2015)  Target Ranges:  Prepandial:   less than 140 mg/dL      Peak postprandial:   less than 180 mg/dL (1-2 hours)      Critically ill patients:  140 - 180 mg/dL   Lab Results  Component Value Date   GLUCAP 137 (H) 03/26/2016   HGBA1C 10.3 (H) 03/19/2016    Review of Glycemic Control  Post-prandial blood sugars elevated. May benefit from increase in meal coverage insulin. Pt eating 100%.  Inpatient Diabetes Program Recommendations:    Increase Novolog to 4 units tidwc for meal coverage insulin  Will continue to follow. Thank you. Lorenda Peck, RD, LDN, CDE Inpatient Diabetes Coordinator [phone]

## 2016-03-26 NOTE — Progress Notes (Signed)
This CM spoke with pt at bedside about dc planning. Pt states she has both Levemir and novolog insulin at home. Pt states she followed up at [facility] after she was discharged previously and plans to follow up with them this time at discharge. Pt is in the process of applying for her orange card. Pt has an appointment at [facility] for 10/4 and appointment was placed on AVS. This Cm stressed importance of close follow up with PCP at discharge. Pt requesting meeting with dietician prior to discharge to get info on foods for both her gastroparesis and DM. This CM will inform MD of request. No other CM needs communicated. Marney Doctor RN,BSN,NCM [phone]

## 2016-03-28 ENCOUNTER — Emergency Department (HOSPITAL_COMMUNITY)
Admission: EM | Admit: 2016-03-28 | Discharge: 2016-03-28 | Disposition: A | Discharge status: Home / Self Care | Payer: Self-pay | Attending: Emergency Medicine | Admitting: Emergency Medicine

## 2016-03-28 ENCOUNTER — Inpatient Hospital Stay: Payer: Self-pay | Admitting: Family Medicine

## 2016-03-28 DIAGNOSIS — I10 Essential (primary) hypertension: Secondary | ICD-10-CM | POA: Insufficient documentation

## 2016-03-28 DIAGNOSIS — E1043 Type 1 diabetes mellitus with diabetic autonomic (poly)neuropathy: Secondary | ICD-10-CM | POA: Insufficient documentation

## 2016-03-28 DIAGNOSIS — E1040 Type 1 diabetes mellitus with diabetic neuropathy, unspecified: Secondary | ICD-10-CM | POA: Insufficient documentation

## 2016-03-28 DIAGNOSIS — R112 Nausea with vomiting, unspecified: Secondary | ICD-10-CM | POA: Insufficient documentation

## 2016-03-28 LAB — CBC WITH DIFFERENTIAL/PLATELET
BASOS PCT: 0 %
Basophils Absolute: 0 10*3/uL (ref 0.0–0.1)
EOS ABS: 0.1 10*3/uL (ref 0.0–0.7)
Eosinophils Relative: 1 %
HCT: 32.4 % — ABNORMAL LOW (ref 36.0–46.0)
HEMOGLOBIN: 10.1 g/dL — AB (ref 12.0–15.0)
Lymphocytes Relative: 9 %
Lymphs Abs: 0.8 10*3/uL (ref 0.7–4.0)
MCH: 22.9 pg — ABNORMAL LOW (ref 26.0–34.0)
MCHC: 31.2 g/dL (ref 30.0–36.0)
MCV: 73.5 fL — ABNORMAL LOW (ref 78.0–100.0)
MONOS PCT: 6 %
Monocytes Absolute: 0.6 10*3/uL (ref 0.1–1.0)
NEUTROS PCT: 84 %
Neutro Abs: 8.2 10*3/uL — ABNORMAL HIGH (ref 1.7–7.7)
PLATELETS: 369 10*3/uL (ref 150–400)
RBC: 4.41 MIL/uL (ref 3.87–5.11)
RDW: 19.2 % — AB (ref 11.5–15.5)
WBC: 9.8 10*3/uL (ref 4.0–10.5)

## 2016-03-28 LAB — COMPREHENSIVE METABOLIC PANEL
ALBUMIN: 3.5 g/dL (ref 3.5–5.0)
ALK PHOS: 83 U/L (ref 38–126)
ALT: 16 U/L (ref 14–54)
ANION GAP: 15 (ref 5–15)
AST: 22 U/L (ref 15–41)
BUN: 12 mg/dL (ref 6–20)
CHLORIDE: 99 mmol/L — AB (ref 101–111)
CO2: 22 mmol/L (ref 22–32)
Calcium: 9 mg/dL (ref 8.9–10.3)
Creatinine, Ser: 1.02 mg/dL — ABNORMAL HIGH (ref 0.44–1.00)
GFR calc Af Amer: >60 mL/min (ref >60)
GFR calc non Af Amer: >60 mL/min (ref >60)
GLUCOSE: 495 mg/dL — AB (ref 65–99)
POTASSIUM: 4.2 mmol/L (ref 3.5–5.1)
SODIUM: 136 mmol/L (ref 135–145)
Total Bilirubin: 1.5 mg/dL — ABNORMAL HIGH (ref 0.3–1.2)
Total Protein: 7.1 g/dL (ref 6.5–8.1)

## 2016-03-28 LAB — FOLATE RBC
FOLATE, HEMOLYSATE: 233.1 ng/mL
Folate, RBC: UNDETERMINED ng/mL

## 2016-03-28 LAB — BLOOD GAS, VENOUS
Acid-base deficit: 1.7 mmol/L (ref 0.0–2.0)
BICARBONATE: 22.3 mmol/L (ref 20.0–28.0)
FIO2: 21
O2 Saturation: 84.2 %
PCO2 VEN: 38.7 mmHg — AB (ref 44.0–60.0)
PH VEN: 7.383 (ref 7.250–7.430)
PO2 VEN: 55.6 mmHg — AB (ref 32.0–45.0)
Patient temperature: 99.8

## 2016-03-28 LAB — HEMATOLOGY COMMENTS:

## 2016-03-28 MED ORDER — SODIUM CHLORIDE 0.9 % IV SOLN
Freq: Once | INTRAVENOUS | Status: AC
  Administered 2016-03-28: 03:00:00 via INTRAVENOUS

## 2016-03-28 MED ORDER — ONDANSETRON HCL 4 MG/2ML IJ SOLN
4.0000 mg | Freq: Once | INTRAMUSCULAR | Status: AC
  Administered 2016-03-28: 4 mg via INTRAVENOUS
  Filled 2016-03-28: qty 2

## 2016-03-28 MED ORDER — HALOPERIDOL LACTATE 5 MG/ML IJ SOLN
2.0000 mg | Freq: Once | INTRAMUSCULAR | Status: AC
  Administered 2016-03-28: 2 mg via INTRAMUSCULAR
  Filled 2016-03-28: qty 1

## 2016-03-28 MED ORDER — INSULIN ASPART 100 UNIT/ML ~~LOC~~ SOLN
10.0000 [IU] | Freq: Once | SUBCUTANEOUS | Status: AC
  Administered 2016-03-28: 10 [IU] via SUBCUTANEOUS
  Filled 2016-03-28: qty 1

## 2016-03-28 MED ORDER — LORAZEPAM 2 MG/ML IJ SOLN
0.5000 mg | Freq: Once | INTRAMUSCULAR | Status: AC
  Administered 2016-03-28: 0.5 mg via INTRAVENOUS
  Filled 2016-03-28: qty 1

## 2016-03-28 MED ORDER — HALOPERIDOL LACTATE 5 MG/ML IJ SOLN: 2.0000 mg | Freq: Once | INTRAMUSCULAR | Status: DC

## 2016-03-28 NOTE — ED Notes (Signed)
Patient appears calmer and breathing at a normal rate per minute; 18 respirations per minute.

## 2016-03-28 NOTE — Discharge Instructions (Signed)
Follow-up with her primary care provider for discussion of today's diagnosis. Return to ER for new or worsening symptoms, any additional concerns.

## 2016-03-28 NOTE — ED Notes (Signed)
Pt started vomiting after fluid challenge.

## 2016-03-28 NOTE — ED Notes (Signed)
Bed: WA06 Expected date:  Expected time:  Means of arrival:  Comments: EMS 

## 2016-03-28 NOTE — ED Provider Notes (Signed)
[facility] DEPT Provider Note   CSN: [id number] Arrival date & time: 03/28/16  0010     History   Chief Complaint Chief Complaint  Patient presents with  . Emesis  . Nausea    HPI Capri Burrous is a 50 y.o. female.  This a 50 year old chronically ill female with a history of diabetes, gastroparesis, depression, DKA, who states that she started having episodes of vomiting approximately 6 PM, yesterday she took one dose of Zofran.  He vomited and has not tried taking any more since that time.  She states that she saw her PCP about 2 weeks ago and he increased her Levemir from 20 units to 25 units.  She states her blood sugar has been under 250.  Since that time      Past Medical History:  Diagnosis Date  . Depression   . Diabetes mellitus without complication ([place])   . Essential hypertension   . Gastroparesis   . GERD (gastroesophageal reflux disease)   . HLD (hyperlipidemia)     Patient Active Problem List   Diagnosis Date Noted  . Type 1 diabetes mellitus with ketoacidosis without coma ([place])   . DKA (diabetic ketoacidoses) ([place]) 03/18/2016  . Noncompliance with treatment plan 03/13/2016  . Essential hypertension 01/24/2016  . Leukocytosis 01/24/2016  . Abnormal ECG 11/27/2015  . HLD (hyperlipidemia)   . GERD (gastroesophageal reflux disease)   . Metabolic acidosis, normal anion gap (NAG)   . AKI (acute kidney injury) ([place])   . Anemia, iron deficiency   . DKA, type 1, not at goal [facility])   . Hyponatremia 09/02/2015  . Vitamin B12 deficiency 08/16/2015  . Lactic acidosis 07/29/2015  . Acute pyelonephritis 07/29/2015  . Pyelonephritis   . Hypothermia   . Diabetic gastroparesis ([place])   . Diabetic neuropathy, type I diabetes mellitus ([place]) 05/18/2015  . Anemia 05/18/2015  . Anxiety and depression 05/18/2015  . Nausea & vomiting   . Diabetic ketoacidosis without coma associated with type 1 diabetes mellitus ([place])   . High anion gap metabolic acidosis  [id number]    Past Surgical History:  Procedure Laterality Date  . COLONOSCOPY  09/27/2014   at [facility]  . ESOPHAGOGASTRODUODENOSCOPY  09/27/2014   at [facility], Dr Rolan Lipa. biospy neg for celiac, neg for H pylori.   . EYE SURGERY    . gailstones    . IR GENERIC HISTORICAL  01/24/2016   IR FLUORO GUIDE CV LINE RIGHT 01/24/2016 Darrell K Allred, PA-C [facility]  . IR GENERIC HISTORICAL  01/24/2016   IR US GUIDE VASC ACCESS RIGHT 01/24/2016 Darrell K Allred, PA-C [facility]  . POSTERIOR VITRECTOMY AND MEMBRANE PEEL-LEFT EYE  09/28/2002  . POSTERIOR VITRECTOMY AND MEMBRANE PEEL-RIGHT EYE  03/16/2002  . RETINAL DETACHMENT SURGERY      OB History    No data available       Home Medications    Prior to Admission medications   Medication Sig Start Date End Date Taking? Authorizing Provider  albuterol (PROVENTIL HFA;VENTOLIN HFA) 108 (90 Base) MCG/ACT inhaler Inhale 2 puffs into the lungs every 6 (six) hours as needed for wheezing or shortness of breath. 10/20/15  Yes [name], MD  busPIRone (BUSPAR) 5 MG tablet Take 1 tablet (5 mg total) by mouth 3 (three) times daily. 01/31/16  Yes [name], MD  docusate sodium (COLACE) 250 MG capsule Take 1 capsule (250 mg total) by mouth 2 (two) times daily. 03/26/16  Yes Donne Hazel, MD  ferrous  sulfate 325 (65 FE) MG tablet Take 1 tablet (325 mg total) by mouth daily with breakfast. 03/27/16  Yes Donne Hazel, MD  furosemide (LASIX) 40 MG tablet Take 40 mg by mouth daily as needed for fluid.    Yes Historical Provider, MD  insulin aspart (NOVOLOG) 100 UNIT/ML injection Inject 0-15 Units into the skin 3 (three) times daily with meals. Per sliding scale 01/31/16  Yes [name], MD  insulin aspart (NOVOLOG) 100 UNIT/ML injection Inject 4 Units into the skin 3 (three) times daily with meals. 03/26/16  Yes Donne Hazel, MD  insulin detemir (LEVEMIR) 100 UNIT/ML injection Inject 0.2 mLs (20 Units total) into the skin at bedtime. 03/26/16  Yes Donne Hazel, MD  metoCLOPramide (REGLAN) 10 MG tablet Take 1 tablet (10 mg total) by mouth 3 (three) times daily before meals. 03/26/16  Yes Donne Hazel, MD  ondansetron (ZOFRAN ODT) 4 MG disintegrating tablet Take 1 tablet (4 mg total) by mouth every 8 (eight) hours as needed for nausea. 01/31/16  Yes [name], MD  polyethylene glycol (MIRALAX / GLYCOLAX) packet Take 17 g by mouth daily as needed for mild constipation. 03/20/16  Yes Janece Canterbury, MD  pregabalin (LYRICA) 150 MG capsule Take 1 capsule (150 mg total) by mouth 2 (two) times daily. 03/13/16  Yes [name], MD  Blood Glucose Monitoring Suppl (TRUE METRIX METER) DEVI 1 each by Does not apply route 3 (three) times daily before meals. 07/19/15   [name], MD  erythromycin (E-MYCIN) 250 MG tablet Take 1 tablet (250 mg total) by mouth 3 (three) times daily with meals as needed. 03/20/16   Janece Canterbury, MD  glucose blood (TRUE METRIX BLOOD GLUCOSE TEST) test strip Use 3 times daily before meals 03/13/16   [name], MD  Insulin Syringe-Needle U-100 (BD INSULIN SYRINGE ULTRAFINE) 31G X 15/64" 0.5 ML MISC 1 each by Does not apply route 4 (four) times daily as needed. 08/16/15   [name], MD    Family History Family History  Problem Relation Age of Onset  . Cystic fibrosis Mother   . Hypertension Father   . Diabetes Brother   . Hypertension Maternal Grandmother     Social History Social History  Substance Use Topics  . Smoking status: Never Smoker  . Smokeless tobacco: Never Used  . Alcohol use No     Allergies   Anesthetics, amide; Penicillins; Buprenorphine hcl; and Encainide   Review of Systems Review of Systems  Constitutional: Negative for fever.  Gastrointestinal: Positive for abdominal pain, nausea and vomiting.  All other systems reviewed and are negative.    Physical Exam Updated Vital Signs BP (!) 176/111   Pulse 106   Temp 99.8 F (37.7 C)   Resp 18   LMP 02/15/2016   SpO2 97%   Physical  Exam  Constitutional: She appears well-developed. She appears distressed.  HENT:  Head: Normocephalic.  Eyes: Pupils are equal, round, and reactive to light.  Neck: Normal range of motion.  Cardiovascular: Tachycardia present.   No murmur heard. Pulmonary/Chest: Effort normal and breath sounds normal.  Abdominal: Soft. Bowel sounds are normal.  Musculoskeletal: Normal range of motion.  Skin: Skin is warm and dry. There is pallor.  Nursing note and vitals reviewed.    ED Treatments / Results  Labs (all labs ordered are listed, but only abnormal results are displayed) Labs Reviewed  CBC WITH DIFFERENTIAL/PLATELET  COMPREHENSIVE METABOLIC PANEL  BLOOD GAS, VENOUS  EKG  EKG Interpretation None       Radiology No results found.  Procedures Procedures (including critical care time)  Medications Ordered in ED Medications  0.9 %  sodium chloride infusion (not administered)  haloperidol lactate (HALDOL) injection 2 mg (2 mg Intramuscular Given 03/28/16 0229)     Initial Impression / Assessment and Plan / ED Course  I have reviewed the triage vital signs and the nursing notes.  Pertinent labs & imaging results that were available during my care of the patient were reviewed by me and considered in my medical decision making (see chart for details).  Clinical Course       Final Clinical Impressions(s) / ED Diagnoses   Final diagnoses:  None    New Prescriptions New Prescriptions   No medications on file     Junius Creamer, NP 03/28/16 [place], MD 03/28/16 2009

## 2016-03-28 NOTE — ED Notes (Signed)
Discharge instructions and follow up care reviewed with patient. Patient verbalized understanding. Patient contacted son to pick her up from hospital.

## 2016-03-28 NOTE — ED Notes (Signed)
Upon arrival in patient's room. Patient tachypnea. Patient states she is having a panic attack. PA aware and to bedside. Patient encouraged to slow/control her breathing.

## 2016-03-28 NOTE — ED Triage Notes (Signed)
Per [facility], pt got home yesterday from the hospital.  Vomiting started approx 3 hours ago.  She has taken her zofran as rx'd, is no longer taking her phenergan.  Pt denies diarrhea.

## 2016-03-28 NOTE — ED Notes (Signed)
Pt provided diet ginger ale to drink.

## 2016-03-31 ENCOUNTER — Encounter (HOSPITAL_COMMUNITY): Payer: Self-pay

## 2016-03-31 ENCOUNTER — Emergency Department (HOSPITAL_COMMUNITY)
Admission: EM | Admit: 2016-03-31 | Discharge: 2016-03-31 | Disposition: A | Discharge status: Home / Self Care | Payer: Self-pay | Attending: Emergency Medicine | Admitting: Emergency Medicine

## 2016-03-31 DIAGNOSIS — K3184 Gastroparesis: Secondary | ICD-10-CM

## 2016-03-31 DIAGNOSIS — E1043 Type 1 diabetes mellitus with diabetic autonomic (poly)neuropathy: Secondary | ICD-10-CM | POA: Insufficient documentation

## 2016-03-31 DIAGNOSIS — Z79899 Other long term (current) drug therapy: Secondary | ICD-10-CM | POA: Insufficient documentation

## 2016-03-31 DIAGNOSIS — I10 Essential (primary) hypertension: Secondary | ICD-10-CM | POA: Insufficient documentation

## 2016-03-31 LAB — COMPREHENSIVE METABOLIC PANEL
ALK PHOS: 78 U/L (ref 38–126)
ALT: 16 U/L (ref 14–54)
AST: 13 U/L — AB (ref 15–41)
Albumin: 3.6 g/dL (ref 3.5–5.0)
Anion gap: 13 (ref 5–15)
BUN: 10 mg/dL (ref 6–20)
CALCIUM: 9.2 mg/dL (ref 8.9–10.3)
CHLORIDE: 103 mmol/L (ref 101–111)
CO2: 18 mmol/L — AB (ref 22–32)
CREATININE: 1.06 mg/dL — AB (ref 0.44–1.00)
GFR calc Af Amer: >60 mL/min (ref >60)
Glucose, Bld: 81 mg/dL (ref 65–99)
Potassium: 3.5 mmol/L (ref 3.5–5.1)
Sodium: 134 mmol/L — ABNORMAL LOW (ref 135–145)
Total Bilirubin: 1.4 mg/dL — ABNORMAL HIGH (ref 0.3–1.2)
Total Protein: 7.1 g/dL (ref 6.5–8.1)

## 2016-03-31 LAB — CBC
HCT: 32.5 % — ABNORMAL LOW (ref 36.0–46.0)
Hemoglobin: 10.2 g/dL — ABNORMAL LOW (ref 12.0–15.0)
MCH: 23.4 pg — AB (ref 26.0–34.0)
MCHC: 31.4 g/dL (ref 30.0–36.0)
MCV: 74.5 fL — AB (ref 78.0–100.0)
PLATELETS: 373 10*3/uL (ref 150–400)
RBC: 4.36 MIL/uL (ref 3.87–5.11)
RDW: 20.7 % — AB (ref 11.5–15.5)
WBC: 7.6 10*3/uL (ref 4.0–10.5)

## 2016-03-31 LAB — URINALYSIS, ROUTINE W REFLEX MICROSCOPIC
HGB URINE DIPSTICK: NEGATIVE
Leukocytes, UA: NEGATIVE
Nitrite: NEGATIVE
PROTEIN: 100 mg/dL — AB
Specific Gravity, Urine: 1.027 (ref 1.005–1.030)
pH: 6 (ref 5.0–8.0)

## 2016-03-31 LAB — URINE MICROSCOPIC-ADD ON: BACTERIA UA: NONE SEEN

## 2016-03-31 LAB — I-STAT BETA HCG BLOOD, ED (MC, WL, AP ONLY): I-stat hCG, quantitative: <5 m[IU]/mL (ref <5)

## 2016-03-31 LAB — CBG MONITORING, ED: Glucose-Capillary: 74 mg/dL (ref 65–99)

## 2016-03-31 LAB — LIPASE, BLOOD: LIPASE: 25 U/L (ref 11–51)

## 2016-03-31 MED ORDER — SODIUM CHLORIDE 0.9 % IV BOLUS (SEPSIS)
1000.0000 mL | Freq: Once | INTRAVENOUS | Status: AC
  Administered 2016-03-31: 1000 mL via INTRAVENOUS

## 2016-03-31 MED ORDER — ONDANSETRON HCL 4 MG/2ML IJ SOLN
4.0000 mg | Freq: Once | INTRAMUSCULAR | Status: AC
  Administered 2016-03-31: 4 mg via INTRAVENOUS
  Filled 2016-03-31: qty 2

## 2016-03-31 MED ORDER — LORAZEPAM 1 MG PO TABS: 1.0000 mg | ORAL_TABLET | Freq: Three times a day (TID) | ORAL | 0 refills | Status: DC | PRN

## 2016-03-31 MED ORDER — ONDANSETRON 4 MG PO TBDP: 4.0000 mg | ORAL_TABLET | Freq: Three times a day (TID) | ORAL | 0 refills | Status: DC | PRN

## 2016-03-31 MED ORDER — LORAZEPAM 2 MG/ML IJ SOLN
1.0000 mg | Freq: Once | INTRAMUSCULAR | Status: AC
  Administered 2016-03-31: 1 mg via INTRAVENOUS
  Filled 2016-03-31: qty 1

## 2016-03-31 MED ORDER — HALOPERIDOL LACTATE 5 MG/ML IJ SOLN
2.0000 mg | Freq: Once | INTRAMUSCULAR | Status: AC
  Administered 2016-03-31: 2 mg via INTRAVENOUS
  Filled 2016-03-31: qty 1

## 2016-03-31 NOTE — ED Provider Notes (Signed)
Patient complains of diffuse abdominal discomfort and vomiting multiple times onset 2 days ago. States symptoms typical of gastroparesis she experienced multiple times in the past. She feels much improved since treatment here   Orlie Dakin, MD 03/31/16 2013

## 2016-03-31 NOTE — Discharge Instructions (Signed)
Take your medication as prescribed as needed for nausea. I also recommend taking her prescription of Ativan as needed for anxiety.  I recommend following up with your primary care provider on Monday for follow-up regarding her chronic abdominal pain associated with gastroparesis and medication changes or urinary anxiety. Please return to the Emergency Department if symptoms worsen or new onset of fever, chest pain, difficulty breathing, abdominal pain, vomiting, unable to keep fluids down, vomiting blood.

## 2016-03-31 NOTE — ED Triage Notes (Signed)
She tells me she is having abd. Pain and vomiting "from my gastroparesis". She is in no distress.

## 2016-03-31 NOTE — ED Triage Notes (Signed)
PT RECEIVED VIA EMS C/O ABDOMINAL PAIN 10/10 WITH N/V. PT TOOK HER BG THIS AM, AND IT WAS OVER 300. PT TOOK 12 UNITS OF INSULIN. BG RECHECK AT 108. EMS GAVE ZOFRAN 4MG  IM PTA.

## 2016-03-31 NOTE — ED Provider Notes (Signed)
[place] DEPT Provider Note   CSN: [id number] Arrival date & time: 03/31/16  1453     History   Chief Complaint Chief Complaint  Patient presents with  . Abdominal Pain  . Emesis    HPI [name] is a 50 y.o. female.  Patient is a 50 year old female with past medical history of type 1 diabetes, gastroparesis, reflux and hypertension who presents the ED with complaint of abdominal pain, onset 2 days. Patient reports having diffuse abdominal pain which she states is consistent with her typical episodes of gastroparesis. Endorses associated nausea and NBNB vomiting. She reports taking Zofran at home with mild intermittent relief. Patient also notes her glucose levels at home have been slightly elevated over the past few days but notes when she took it this morning her glucose was 108, reports giving herself 12 units of NovoLog today around 9am. Denies fever, chills, headache, cough, difficulty breathing, hematemesis, diarrhea, urinary symptoms, vaginal bleeding, vaginal discharge. Denies taking any medications at home for pain. Reports abdominal surgical history of cholecystectomy.      Past Medical History:  Diagnosis Date  . Depression   . Diabetes mellitus without complication ([facility] [place])   . Essential hypertension   . Gastroparesis   . GERD (gastroesophageal reflux disease)   . HLD (hyperlipidemia)     Patient Active Problem List   Diagnosis Date Noted  . Type 1 diabetes mellitus with ketoacidosis without coma ([place])   . DKA (diabetic ketoacidoses) ([place]) 03/18/2016  . Noncompliance with treatment plan 03/13/2016  . Essential hypertension 01/24/2016  . Leukocytosis 01/24/2016  . Abnormal ECG 11/27/2015  . HLD (hyperlipidemia)   . GERD (gastroesophageal reflux disease)   . Metabolic acidosis, normal anion gap (NAG)   . AKI (acute kidney injury) ([place])   . Anemia, iron deficiency   . DKA, type 1, not at goal [facility])   . Hyponatremia 09/02/2015  . Vitamin B12 deficiency  08/16/2015  . Lactic acidosis 07/29/2015  . Acute pyelonephritis 07/29/2015  . Pyelonephritis   . Hypothermia   . Diabetic gastroparesis ([place])   . Diabetic neuropathy, type I diabetes mellitus ([place]) 05/18/2015  . Anemia 05/18/2015  . Anxiety and depression 05/18/2015  . Nausea & vomiting   . Diabetic ketoacidosis without coma associated with type 1 diabetes mellitus ([place])   . High anion gap metabolic acidosis [id number]    Past Surgical History:  Procedure Laterality Date  . COLONOSCOPY  09/27/2014   at [facility]  . ESOPHAGOGASTRODUODENOSCOPY  09/27/2014   at [facility], Dr Rolan Lipa. biospy neg for celiac, neg for H pylori.   . EYE SURGERY    . gailstones    . IR GENERIC HISTORICAL  01/24/2016   IR FLUORO GUIDE CV LINE RIGHT 01/24/2016 Darrell K Allred, PA-C [facility]  . IR GENERIC HISTORICAL  01/24/2016   IR US GUIDE VASC ACCESS RIGHT 01/24/2016 Darrell K Allred, PA-C [facility]  . POSTERIOR VITRECTOMY AND MEMBRANE PEEL-LEFT EYE  09/28/2002  . POSTERIOR VITRECTOMY AND MEMBRANE PEEL-RIGHT EYE  03/16/2002  . RETINAL DETACHMENT SURGERY      OB History    No data available       Home Medications    Prior to Admission medications   Medication Sig Start Date End Date Taking? Authorizing Provider  albuterol (PROVENTIL HFA;VENTOLIN HFA) 108 (90 Base) MCG/ACT inhaler Inhale 2 puffs into the lungs every 6 (six) hours as needed for wheezing or shortness of breath. 10/20/15   [name], MD  Blood Glucose Monitoring  Suppl (TRUE METRIX METER) DEVI 1 each by Does not apply route 3 (three) times daily before meals. 07/19/15   [name], MD  busPIRone (BUSPAR) 5 MG tablet Take 1 tablet (5 mg total) by mouth 3 (three) times daily. 01/31/16   [name], MD  docusate sodium (COLACE) 250 MG capsule Take 1 capsule (250 mg total) by mouth 2 (two) times daily. 03/26/16   Donne Hazel, MD  erythromycin (E-MYCIN) 250 MG tablet Take 1 tablet (250 mg total) by mouth 3 (three) times daily with meals as  needed. 03/20/16   Janece Canterbury, MD  ferrous sulfate 325 (65 FE) MG tablet Take 1 tablet (325 mg total) by mouth daily with breakfast. 03/27/16   Donne Hazel, MD  furosemide (LASIX) 40 MG tablet Take 40 mg by mouth daily as needed for fluid.     Historical Provider, MD  glucose blood (TRUE METRIX BLOOD GLUCOSE TEST) test strip Use 3 times daily before meals 03/13/16   [name], MD  insulin aspart (NOVOLOG) 100 UNIT/ML injection Inject 0-15 Units into the skin 3 (three) times daily with meals. Per sliding scale 01/31/16   [name], MD  insulin aspart (NOVOLOG) 100 UNIT/ML injection Inject 4 Units into the skin 3 (three) times daily with meals. 03/26/16   Donne Hazel, MD  insulin detemir (LEVEMIR) 100 UNIT/ML injection Inject 0.2 mLs (20 Units total) into the skin at bedtime. 03/26/16   Donne Hazel, MD  Insulin Syringe-Needle U-100 (BD INSULIN SYRINGE ULTRAFINE) 31G X 15/64" 0.5 ML MISC 1 each by Does not apply route 4 (four) times daily as needed. 08/16/15   [name], MD  LORazepam (ATIVAN) 1 MG tablet Take 1 tablet (1 mg total) by mouth 3 (three) times daily as needed for anxiety. 03/31/16   Nona Dell, PA-C  metoCLOPramide (REGLAN) 10 MG tablet Take 1 tablet (10 mg total) by mouth 3 (three) times daily before meals. 03/26/16   Donne Hazel, MD  ondansetron (ZOFRAN ODT) 4 MG disintegrating tablet Take 1 tablet (4 mg total) by mouth every 8 (eight) hours as needed for nausea or vomiting. 03/31/16   Nona Dell, PA-C  polyethylene glycol [facility] / Floria Raveling) packet Take 17 g by mouth daily as needed for mild constipation. 03/20/16   Janece Canterbury, MD  pregabalin (LYRICA) 150 MG capsule Take 1 capsule (150 mg total) by mouth 2 (two) times daily. 03/13/16   [name], MD    Family History Family History  Problem Relation Age of Onset  . Cystic fibrosis Mother   . Hypertension Father   . Diabetes Brother   . Hypertension Maternal Grandmother     Social  History Social History  Substance Use Topics  . Smoking status: Never Smoker  . Smokeless tobacco: Never Used  . Alcohol use No     Allergies   Anesthetics, amide; Penicillins; Buprenorphine hcl; and Encainide   Review of Systems Review of Systems  Gastrointestinal: Positive for abdominal pain, nausea and vomiting.  All other systems reviewed and are negative.    Physical Exam Updated Vital Signs BP 145/64 (BP Location: Right Arm)   Pulse 100   Temp 98.5 F (36.9 C) (Oral)   Resp 20   LMP 02/15/2016 (Approximate)   SpO2 99%   Physical Exam  Constitutional: She is oriented to person, place, and time. She appears well-developed and well-nourished.  HENT:  Head: Normocephalic and atraumatic.  Mouth/Throat: Oropharynx is clear and moist. No oropharyngeal  exudate.  Eyes: Conjunctivae and EOM are normal. Right eye exhibits no discharge. Left eye exhibits no discharge. No scleral icterus.  Neck: Normal range of motion. Neck supple.  Cardiovascular: Normal rate, regular rhythm, normal heart sounds and intact distal pulses.   Pulmonary/Chest: Effort normal and breath sounds normal. No respiratory distress. She has no wheezes. She has no rales. She exhibits no tenderness.  Abdominal: Soft. Bowel sounds are normal. She exhibits no distension and no mass. There is tenderness (mild diffuse abdominal tenderness). There is no rebound and no guarding. No hernia.  Musculoskeletal: Normal range of motion. She exhibits no edema.  Neurological: She is alert and oriented to person, place, and time.  Skin: Skin is warm and dry.  Nursing note and vitals reviewed.    ED Treatments / Results  Labs (all labs ordered are listed, but only abnormal results are displayed) Labs Reviewed  COMPREHENSIVE METABOLIC PANEL - Abnormal; Notable for the following:       Result Value   Sodium 134 (*)    CO2 18 (*)    Creatinine, Ser 1.06 (*)    AST 13 (*)    Total Bilirubin 1.4 (*)    All other  components within normal limits  CBC - Abnormal; Notable for the following:    Hemoglobin 10.2 (*)    HCT 32.5 (*)    MCV 74.5 (*)    MCH 23.4 (*)    RDW 20.7 (*)    All other components within normal limits  URINALYSIS, ROUTINE W REFLEX MICROSCOPIC (NOT AT [facility]) - Abnormal; Notable for the following:    APPearance CLOUDY (*)    Glucose, UA >1000 (*)    Bilirubin Urine LARGE (*)    Ketones, ur >80 (*)    Protein, ur 100 (*)    All other components within normal limits  URINE MICROSCOPIC-ADD ON - Abnormal; Notable for the following:    Squamous Epithelial / LPF 6-30 (*)    All other components within normal limits  LIPASE, BLOOD  I-STAT BETA HCG BLOOD, ED ([facility], [facility], [facility] ONLY)  CBG MONITORING, ED    EKG  EKG Interpretation None       Radiology No results found.  Procedures Procedures (including critical care time)  Medications Ordered in ED Medications  sodium chloride 0.9 % bolus 1,000 mL (0 mLs Intravenous Stopped 03/31/16 1834)  ondansetron (ZOFRAN) injection 4 mg (4 mg Intravenous Given 03/31/16 1711)  LORazepam (ATIVAN) injection 1 mg (1 mg Intravenous Given 03/31/16 1710)  haloperidol lactate (HALDOL) injection 2 mg (2 mg Intravenous Given 03/31/16 1851)     Initial Impression / Assessment and Plan / ED Course  I have reviewed the triage vital signs and the nursing notes.  Pertinent labs & imaging results that were available during my care of the patient were reviewed by me and considered in my medical decision making (see chart for details).  Clinical Course    Patient presents with abdominal pain with associated nausea and vomiting which she states is consistent with her history of gastroparesis. Denies fever. Reports taking her insulin this morning around 9am. VSS. Exam revealed mild diffuse abdominal tenderness, no peritoneal signs. Moist mucous membranes, patient appears well-hydrated. Patient given IV fluids, Zofran and Ativan.   Glucose 81. Pregnancy  negative. UA positive for glucose and ketones. No anion gap. On reevaluation patient reports her pain has improved but is still requesting another dose to help improve her pain further. Patient given dose of Haldol in  the ED. Patient and sister at bedside reports that her episodes of gastroparesis or typically worsened and she has anxiety. She notes she takes at home but states that has not been helping with her anxiety. I discussed with patient importance of following up with her primary care provider to discuss medication changes regarding her anxiety.   On reevaluation patient reports her pain has improved. Will tolerate PO. Suspect patient's symptoms are likely due to acute episode of gastroparesis. Plan to discharge patient home with symptomatic treatment and short rx of Ativan (#6) for anxiety until she follows up with her PCP on Monday. Discussed return precautions.   Final Clinical Impressions(s) / ED Diagnoses   Final diagnoses:  Gastroparesis    New Prescriptions New Prescriptions   LORAZEPAM (ATIVAN) 1 MG TABLET    Take 1 tablet (1 mg total) by mouth 3 (three) times daily as needed for anxiety.   ONDANSETRON (ZOFRAN ODT) 4 MG DISINTEGRATING TABLET    Take 1 tablet (4 mg total) by mouth every 8 (eight) hours as needed for nausea or vomiting.     Chesley Noon [place], [place] 03/31/16 2024    Orlie Dakin, MD 03/31/16 [phone]

## 2016-04-01 IMAGING — DX DG ABDOMEN 1V
2 series · 2 of 2 positions shown · non-contrast
Comparison: 05/31/2015

CLINICAL DATA: Generalized abdominal pain. History of
gastroparesis.

EXAM:
ABDOMEN - 1 VIEW

[abdomen kub (1 of 2)]
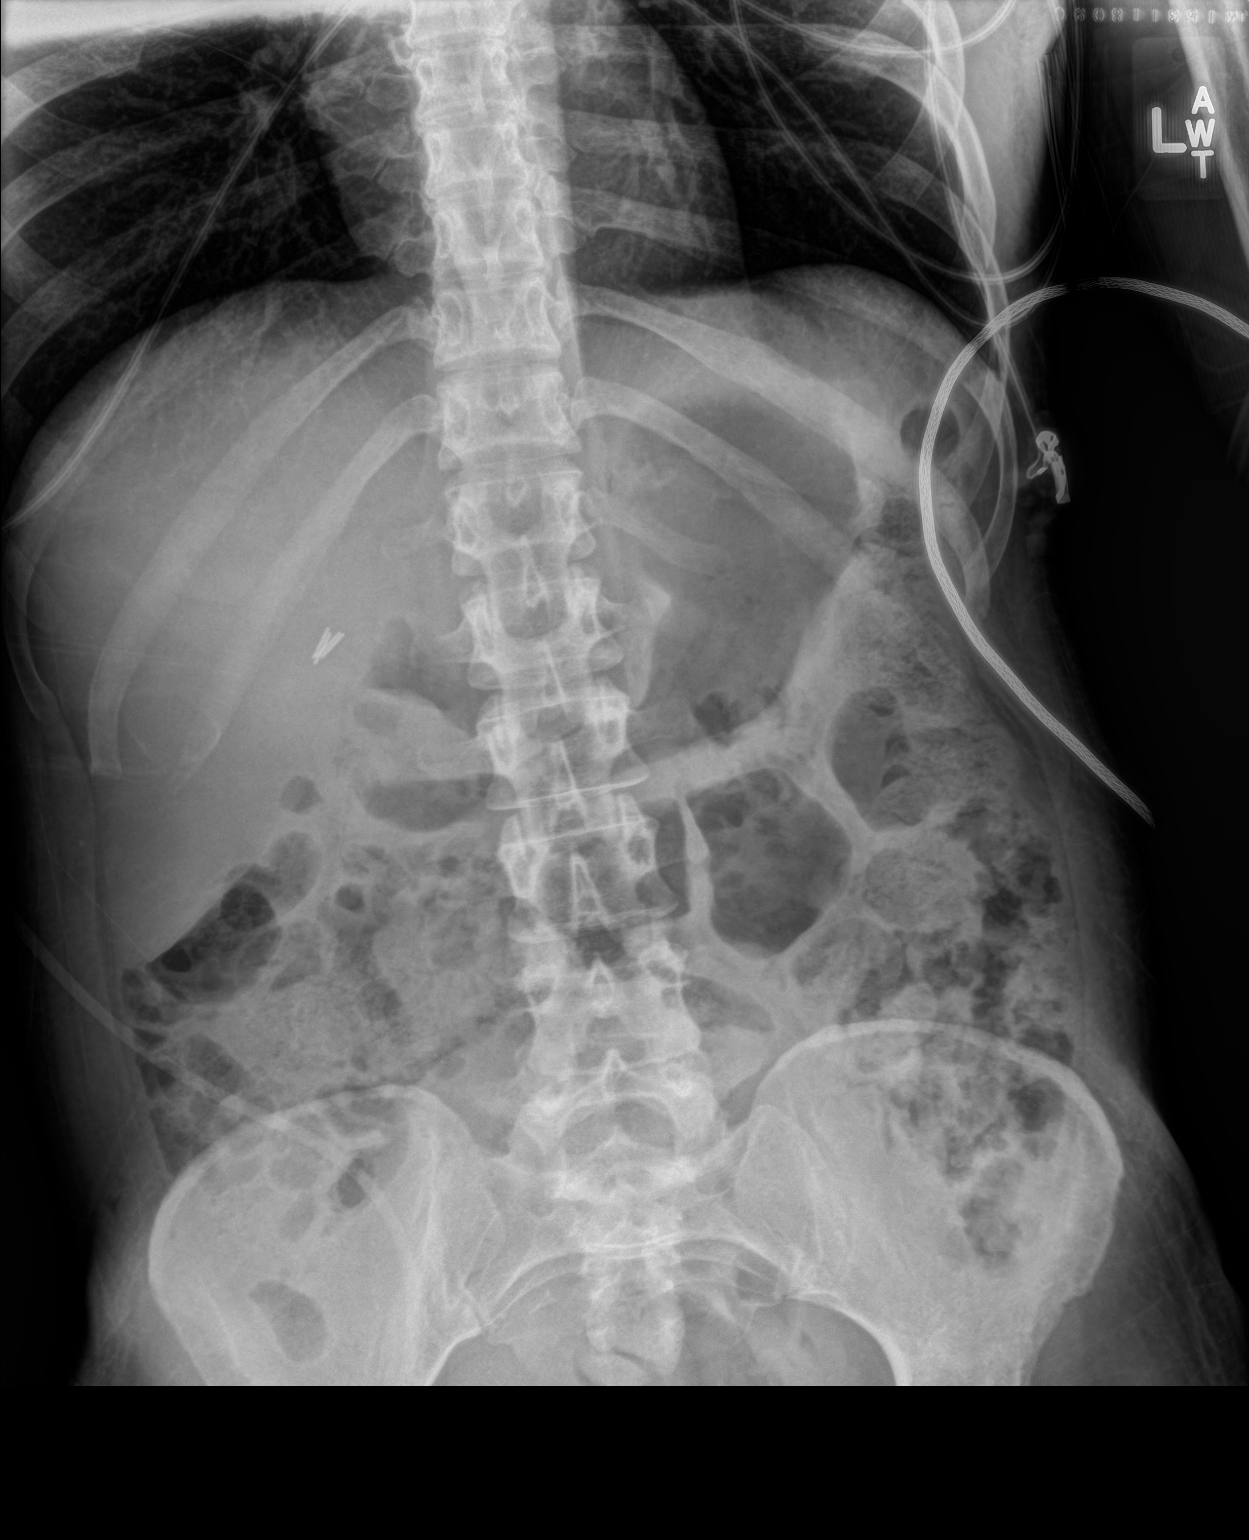

[abdomen kub (2 of 2)]
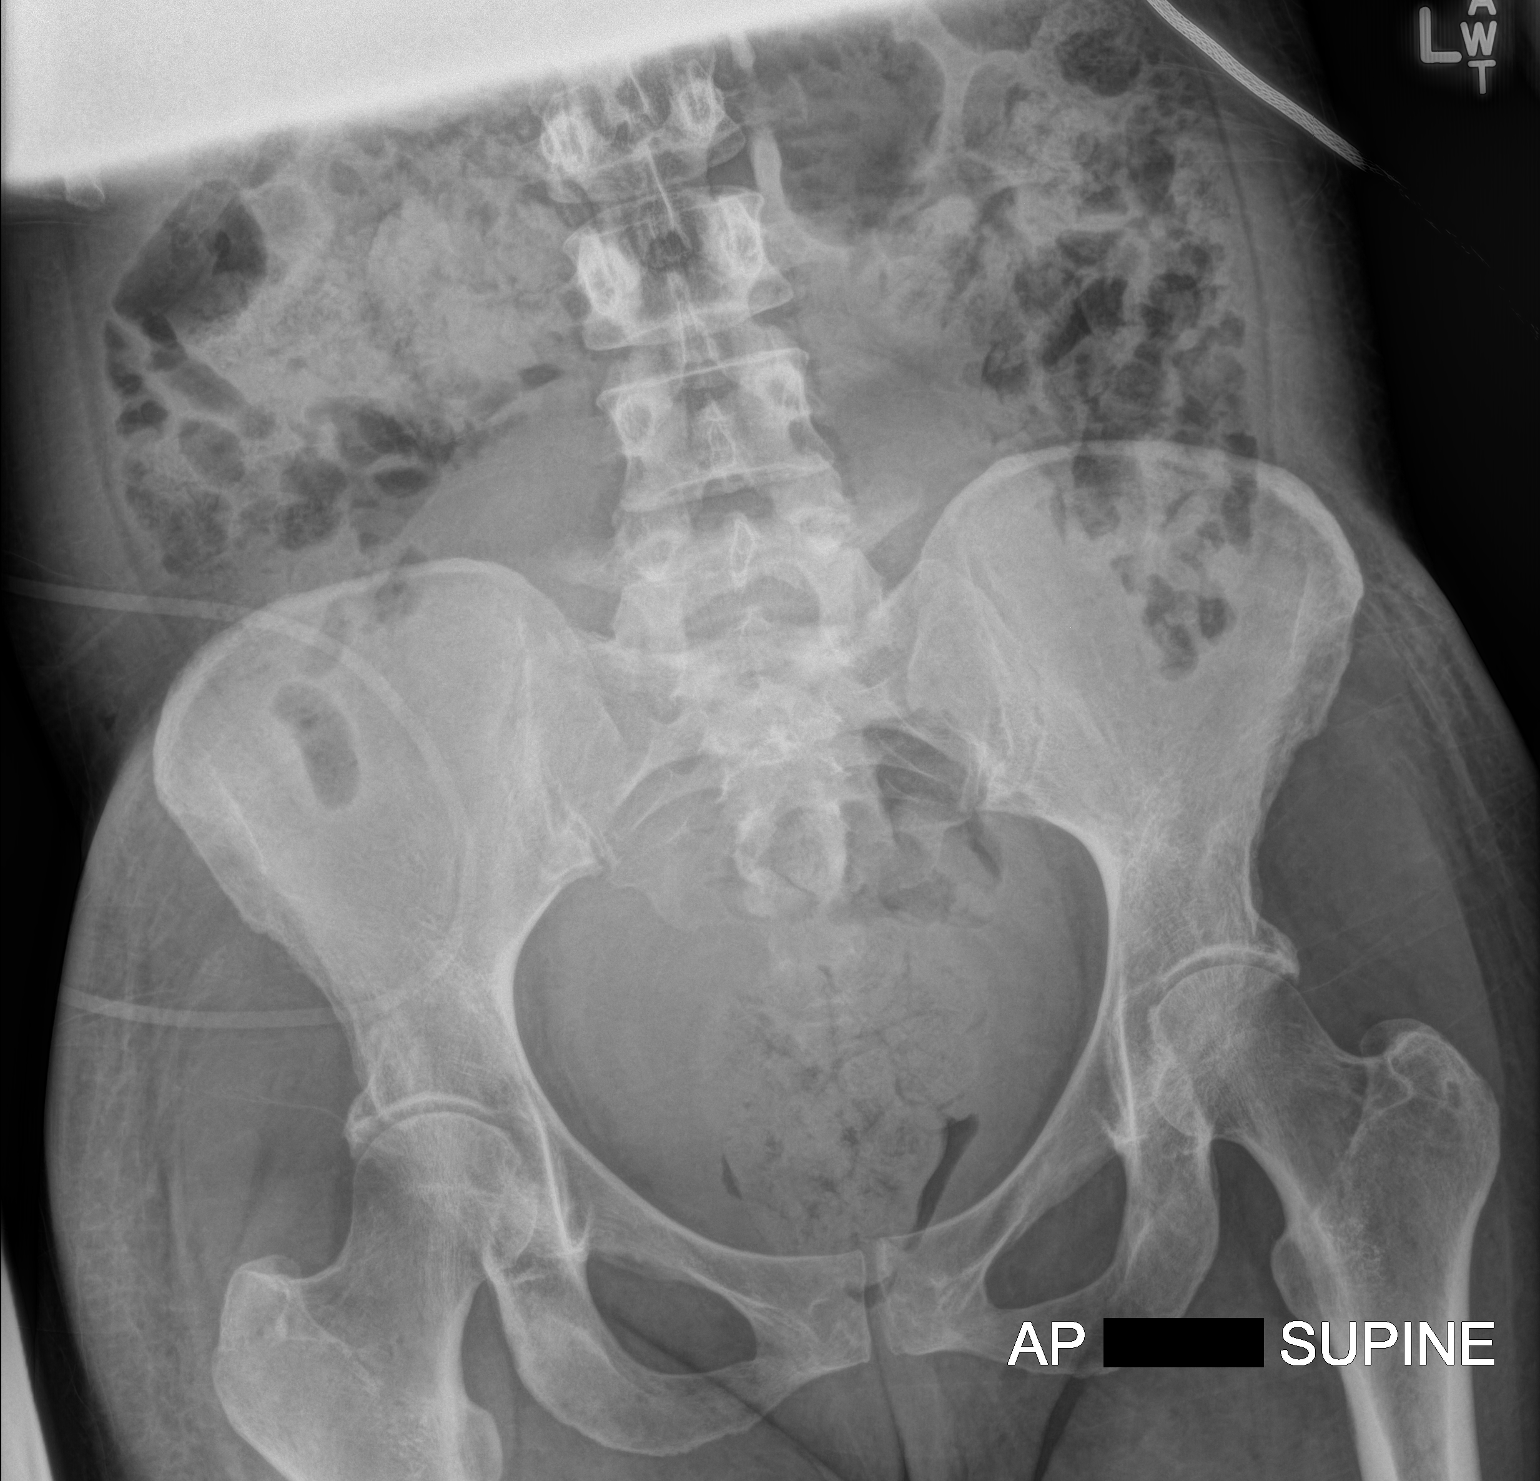

[2 of 2 positions shown; findings below may reference images not displayed]

FINDINGS: Supine images of the abdomen and pelvis were obtained. Moderate
stool burden throughout the abdomen and pelvis. Small amount of gas
in the stomach. Limited evaluation for free air on this supine
examination. Nonobstructive bowel gas pattern. No large abdominal
calcifications. Cholecystectomy clips in the right abdomen.
IMPRESSION: Moderate stool burden in the abdomen and pelvis. Nonobstructive
bowel gas pattern.

## 2016-04-02 IMAGING — DX DG CHEST 1V PORT
1 series · 1 of 1 positions shown · non-contrast
Comparison: May 23, 2015

CLINICAL DATA: Central line placement

EXAM:
PORTABLE CHEST 1 VIEW

[chest ap]
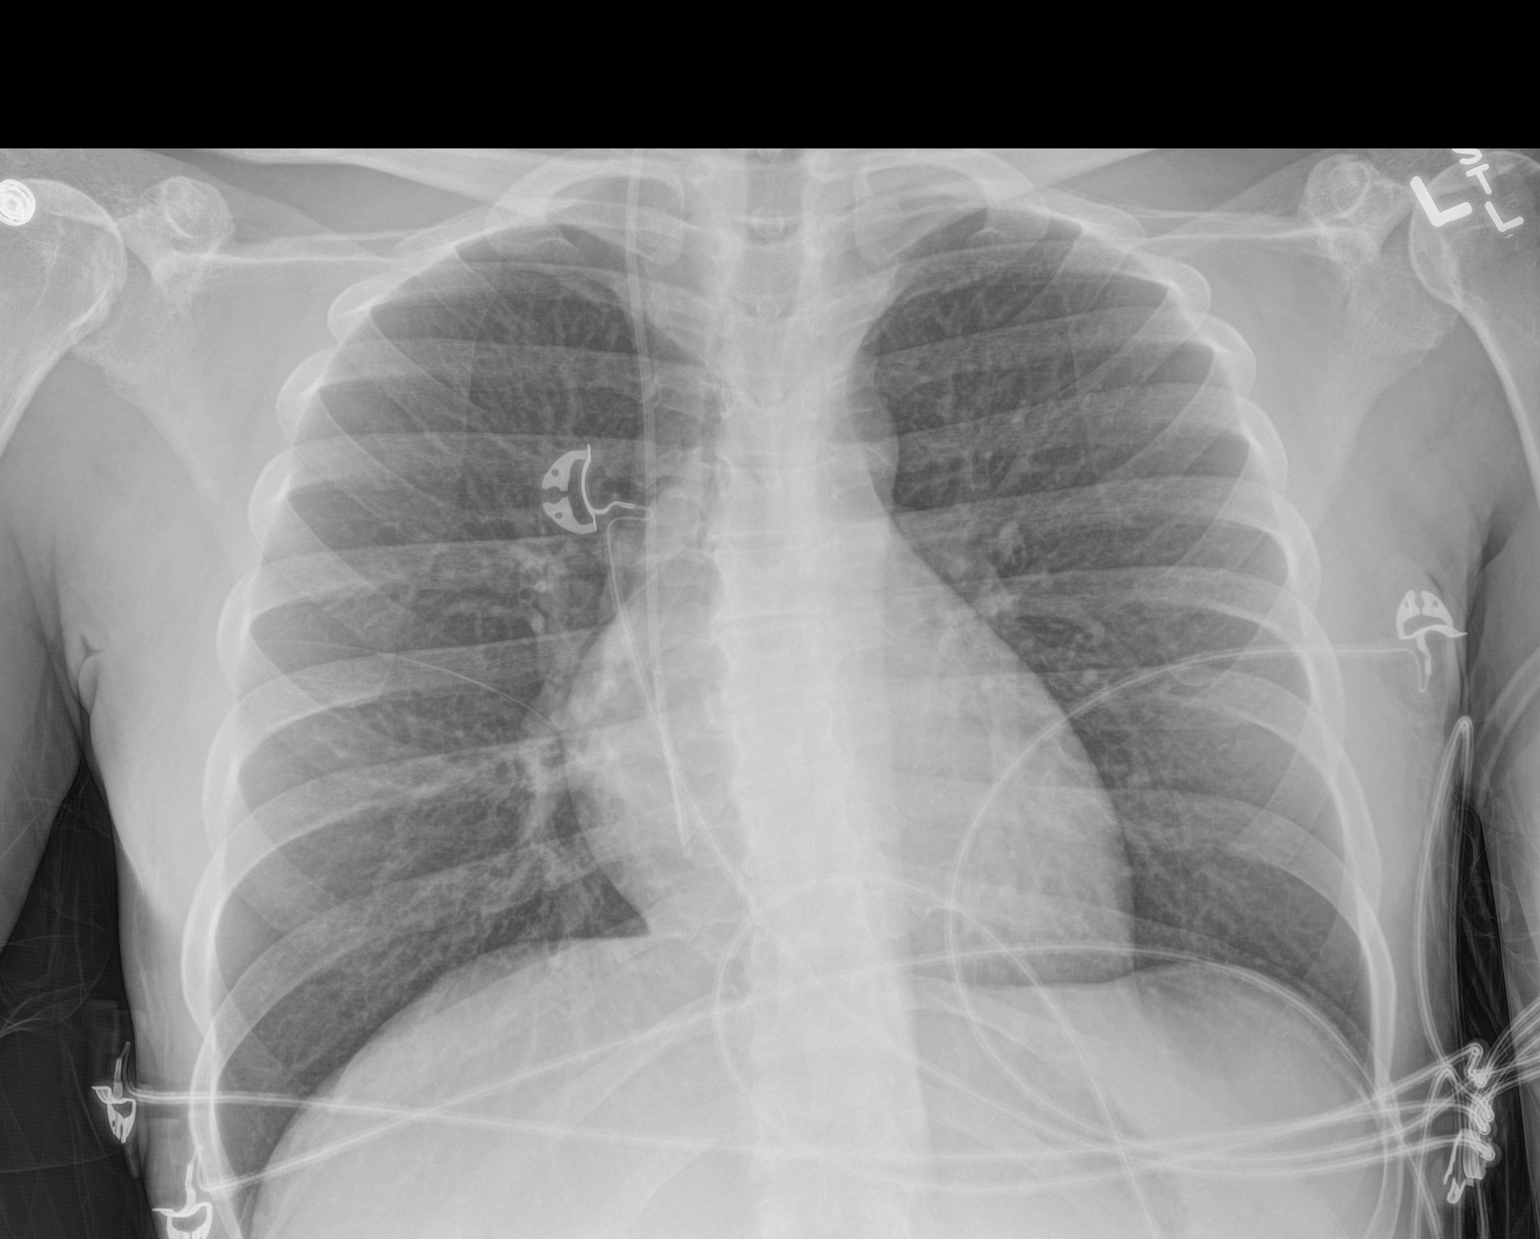

[1 of 1 positions shown; findings below may reference images not displayed]

FINDINGS: The heart size and mediastinal contours are within normal limits.
Right jugular central venous line is identified with distal tip in
the right atrium. Both lungs are clear. There is no pneumothorax.
The visualized skeletal structures are unremarkable.
IMPRESSION: No active cardial disease. Right jugular central venous line with
distal tip in the right atrium. There is no pneumothorax.

## 2016-04-06 ENCOUNTER — Inpatient Hospital Stay (HOSPITAL_COMMUNITY)
Admission: EM | Admit: 2016-04-06 | Discharge: 2016-04-09 | DRG: 638 | Disposition: A | Discharge status: Home / Self Care | Payer: Self-pay | Attending: Internal Medicine | Admitting: Internal Medicine

## 2016-04-06 ENCOUNTER — Encounter (HOSPITAL_COMMUNITY): Payer: Self-pay | Admitting: *Deleted

## 2016-04-06 DIAGNOSIS — I10 Essential (primary) hypertension: Secondary | ICD-10-CM | POA: Diagnosis present

## 2016-04-06 DIAGNOSIS — Z8249 Family history of ischemic heart disease and other diseases of the circulatory system: Secondary | ICD-10-CM

## 2016-04-06 DIAGNOSIS — Z884 Allergy status to anesthetic agent status: Secondary | ICD-10-CM

## 2016-04-06 DIAGNOSIS — K219 Gastro-esophageal reflux disease without esophagitis: Secondary | ICD-10-CM | POA: Diagnosis present

## 2016-04-06 DIAGNOSIS — Z833 Family history of diabetes mellitus: Secondary | ICD-10-CM

## 2016-04-06 DIAGNOSIS — Z794 Long term (current) use of insulin: Secondary | ICD-10-CM

## 2016-04-06 DIAGNOSIS — E1043 Type 1 diabetes mellitus with diabetic autonomic (poly)neuropathy: Secondary | ICD-10-CM | POA: Diagnosis present

## 2016-04-06 DIAGNOSIS — D72829 Elevated white blood cell count, unspecified: Secondary | ICD-10-CM | POA: Diagnosis present

## 2016-04-06 DIAGNOSIS — E1010 Type 1 diabetes mellitus with ketoacidosis without coma: Principal | ICD-10-CM

## 2016-04-06 DIAGNOSIS — E785 Hyperlipidemia, unspecified: Secondary | ICD-10-CM | POA: Diagnosis present

## 2016-04-06 DIAGNOSIS — N179 Acute kidney failure, unspecified: Secondary | ICD-10-CM | POA: Diagnosis present

## 2016-04-06 DIAGNOSIS — F411 Generalized anxiety disorder: Secondary | ICD-10-CM | POA: Diagnosis present

## 2016-04-06 DIAGNOSIS — E1069 Type 1 diabetes mellitus with other specified complication: Secondary | ICD-10-CM | POA: Diagnosis present

## 2016-04-06 DIAGNOSIS — Z9119 Patient's noncompliance with other medical treatment and regimen: Secondary | ICD-10-CM

## 2016-04-06 DIAGNOSIS — Z23 Encounter for immunization: Secondary | ICD-10-CM

## 2016-04-06 DIAGNOSIS — F329 Major depressive disorder, single episode, unspecified: Secondary | ICD-10-CM | POA: Diagnosis present

## 2016-04-06 DIAGNOSIS — R262 Difficulty in walking, not elsewhere classified: Secondary | ICD-10-CM

## 2016-04-06 DIAGNOSIS — K3184 Gastroparesis: Secondary | ICD-10-CM | POA: Diagnosis present

## 2016-04-06 DIAGNOSIS — Z880 Allergy status to penicillin: Secondary | ICD-10-CM

## 2016-04-06 DIAGNOSIS — Z79899 Other long term (current) drug therapy: Secondary | ICD-10-CM

## 2016-04-06 DIAGNOSIS — Z885 Allergy status to narcotic agent status: Secondary | ICD-10-CM

## 2016-04-06 DIAGNOSIS — D7589 Other specified diseases of blood and blood-forming organs: Secondary | ICD-10-CM | POA: Diagnosis present

## 2016-04-06 LAB — BLOOD GAS, ARTERIAL
ACID-BASE DEFICIT: 10.3 mmol/L — AB (ref 0.0–2.0)
Bicarbonate: 15.2 mmol/L — ABNORMAL LOW (ref 20.0–28.0)
Drawn by: 295031
FIO2: 21
O2 SAT: 95.2 %
PATIENT TEMPERATURE: 98.6
PO2 ART: 95.7 mmHg (ref 83.0–108.0)
pCO2 arterial: 33.7 mmHg (ref 32.0–48.0)
pH, Arterial: 7.277 — ABNORMAL LOW (ref 7.350–7.450)

## 2016-04-06 LAB — URINALYSIS, ROUTINE W REFLEX MICROSCOPIC
BILIRUBIN URINE: NEGATIVE
Glucose, UA: >1000 mg/dL — AB
Hgb urine dipstick: NEGATIVE
Leukocytes, UA: NEGATIVE
NITRITE: NEGATIVE
PROTEIN: NEGATIVE mg/dL
Specific Gravity, Urine: 1.022 (ref 1.005–1.030)
pH: 5.5 (ref 5.0–8.0)

## 2016-04-06 LAB — CBC WITH DIFFERENTIAL/PLATELET
BASOS ABS: 0 10*3/uL (ref 0.0–0.1)
Basophils Relative: 0 %
EOS ABS: 0 10*3/uL (ref 0.0–0.7)
Eosinophils Relative: 0 %
HCT: 40.1 % (ref 36.0–46.0)
HEMOGLOBIN: 12.2 g/dL (ref 12.0–15.0)
LYMPHS ABS: 1.2 10*3/uL (ref 0.7–4.0)
LYMPHS PCT: 8 %
MCH: 23.6 pg — ABNORMAL LOW (ref 26.0–34.0)
MCHC: 30.4 g/dL (ref 30.0–36.0)
MCV: 77.6 fL — ABNORMAL LOW (ref 78.0–100.0)
Monocytes Absolute: 0.5 10*3/uL (ref 0.1–1.0)
Monocytes Relative: 3 %
NEUTROS ABS: 13.4 10*3/uL — AB (ref 1.7–7.7)
Neutrophils Relative %: 89 %
Platelets: 504 10*3/uL — ABNORMAL HIGH (ref 150–400)
RBC: 5.17 MIL/uL — ABNORMAL HIGH (ref 3.87–5.11)
RDW: 21.9 % — AB (ref 11.5–15.5)
WBC: 15.1 10*3/uL — ABNORMAL HIGH (ref 4.0–10.5)

## 2016-04-06 LAB — I-STAT CHEM 8, ED
BUN: 23 mg/dL — ABNORMAL HIGH (ref 6–20)
CREATININE: 1.1 mg/dL — AB (ref 0.44–1.00)
Calcium, Ion: 1.1 mmol/L — ABNORMAL LOW (ref 1.15–1.40)
Chloride: 93 mmol/L — ABNORMAL LOW (ref 101–111)
Glucose, Bld: 658 mg/dL (ref 65–99)
HCT: 45 % (ref 36.0–46.0)
HEMOGLOBIN: 15.3 g/dL — AB (ref 12.0–15.0)
Potassium: 4.9 mmol/L (ref 3.5–5.1)
SODIUM: 131 mmol/L — AB (ref 135–145)
TCO2: 23 mmol/L (ref 0–100)

## 2016-04-06 LAB — COMPREHENSIVE METABOLIC PANEL
ALT: 24 U/L (ref 14–54)
AST: 31 U/L (ref 15–41)
Albumin: 4.6 g/dL (ref 3.5–5.0)
Alkaline Phosphatase: 103 U/L (ref 38–126)
Anion gap: 24 — ABNORMAL HIGH (ref 5–15)
BUN: 22 mg/dL — ABNORMAL HIGH (ref 6–20)
CHLORIDE: 89 mmol/L — AB (ref 101–111)
CO2: 19 mmol/L — ABNORMAL LOW (ref 22–32)
Calcium: 9.9 mg/dL (ref 8.9–10.3)
Creatinine, Ser: 1.41 mg/dL — ABNORMAL HIGH (ref 0.44–1.00)
GFR, EST AFRICAN AMERICAN: 50 mL/min — AB (ref >60)
GFR, EST NON AFRICAN AMERICAN: 43 mL/min — AB (ref >60)
Glucose, Bld: 692 mg/dL (ref 65–99)
POTASSIUM: 4.9 mmol/L (ref 3.5–5.1)
Sodium: 132 mmol/L — ABNORMAL LOW (ref 135–145)
Total Bilirubin: 2 mg/dL — ABNORMAL HIGH (ref 0.3–1.2)
Total Protein: 8.7 g/dL — ABNORMAL HIGH (ref 6.5–8.1)

## 2016-04-06 LAB — BASIC METABOLIC PANEL
ANION GAP: 8 (ref 5–15)
BUN: 20 mg/dL (ref 6–20)
CALCIUM: 8.4 mg/dL — AB (ref 8.9–10.3)
CO2: 20 mmol/L — ABNORMAL LOW (ref 22–32)
Chloride: 113 mmol/L — ABNORMAL HIGH (ref 101–111)
Creatinine, Ser: 1.08 mg/dL — ABNORMAL HIGH (ref 0.44–1.00)
GFR, EST NON AFRICAN AMERICAN: 60 mL/min — AB (ref >60)
Glucose, Bld: 223 mg/dL — ABNORMAL HIGH (ref 65–99)
Potassium: 4.7 mmol/L (ref 3.5–5.1)
Sodium: 141 mmol/L (ref 135–145)

## 2016-04-06 LAB — CBG MONITORING, ED
GLUCOSE-CAPILLARY: 585 mg/dL — AB (ref 65–99)
GLUCOSE-CAPILLARY: 506 mg/dL — AB (ref 65–99)
Glucose-Capillary: >600 mg/dL (ref 65–99)

## 2016-04-06 LAB — GLUCOSE, CAPILLARY
GLUCOSE-CAPILLARY: 333 mg/dL — AB (ref 65–99)
GLUCOSE-CAPILLARY: 247 mg/dL — AB (ref 65–99)
GLUCOSE-CAPILLARY: 161 mg/dL — AB (ref 65–99)
GLUCOSE-CAPILLARY: 180 mg/dL — AB (ref 65–99)
Glucose-Capillary: 439 mg/dL — ABNORMAL HIGH (ref 65–99)
Glucose-Capillary: 167 mg/dL — ABNORMAL HIGH (ref 65–99)
Glucose-Capillary: 166 mg/dL — ABNORMAL HIGH (ref 65–99)
Glucose-Capillary: 160 mg/dL — ABNORMAL HIGH (ref 65–99)

## 2016-04-06 LAB — URINE MICROSCOPIC-ADD ON

## 2016-04-06 LAB — I-STAT BETA HCG BLOOD, ED (MC, WL, AP ONLY)

## 2016-04-06 MED ORDER — INSULIN DETEMIR 100 UNIT/ML ~~LOC~~ SOLN
20.0000 [IU] | Freq: Every day | SUBCUTANEOUS | Status: DC
  Filled 2016-04-06: qty 0.2

## 2016-04-06 MED ORDER — SODIUM CHLORIDE 0.9 % IV SOLN: INTRAVENOUS | Status: DC

## 2016-04-06 MED ORDER — FERROUS SULFATE 325 (65 FE) MG PO TABS
325.0000 mg | ORAL_TABLET | Freq: Every day | ORAL | Status: DC
  Administered 2016-04-07 – 2016-04-09 (×3): 325 mg via ORAL
  Filled 2016-04-06 (×3): qty 1

## 2016-04-06 MED ORDER — POLYETHYLENE GLYCOL 3350 17 G PO PACK: 17.0000 g | PACK | Freq: Every day | ORAL | Status: DC | PRN

## 2016-04-06 MED ORDER — ONDANSETRON 4 MG PO TBDP
4.0000 mg | ORAL_TABLET | Freq: Three times a day (TID) | ORAL | Status: DC | PRN
  Administered 2016-04-06 – 2016-04-07 (×2): 4 mg via ORAL
  Filled 2016-04-06 (×2): qty 1

## 2016-04-06 MED ORDER — PREGABALIN 75 MG PO CAPS
150.0000 mg | ORAL_CAPSULE | Freq: Two times a day (BID) | ORAL | Status: DC
  Administered 2016-04-06 – 2016-04-09 (×7): 150 mg via ORAL
  Filled 2016-04-06 (×3): qty 2
  Filled 2016-04-06 (×2): qty 3
  Filled 2016-04-06: qty 2
  Filled 2016-04-06 (×2): qty 3

## 2016-04-06 MED ORDER — DOCUSATE SODIUM 50 MG PO CAPS
250.0000 mg | ORAL_CAPSULE | Freq: Two times a day (BID) | ORAL | Status: DC
  Administered 2016-04-07 – 2016-04-09 (×5): 250 mg via ORAL
  Filled 2016-04-06 (×6): qty 1

## 2016-04-06 MED ORDER — POTASSIUM CHLORIDE 10 MEQ/100ML IV SOLN
10.0000 meq | INTRAVENOUS | Status: AC
  Administered 2016-04-06 (×2): 10 meq via INTRAVENOUS
  Filled 2016-04-06 (×2): qty 100

## 2016-04-06 MED ORDER — LORAZEPAM 2 MG/ML IJ SOLN
1.0000 mg | Freq: Once | INTRAMUSCULAR | Status: AC
  Administered 2016-04-06: 1 mg via INTRAVENOUS
  Filled 2016-04-06: qty 1

## 2016-04-06 MED ORDER — SODIUM CHLORIDE 0.9 % IV SOLN
INTRAVENOUS | Status: DC
  Administered 2016-04-06: 15:00:00 via INTRAVENOUS

## 2016-04-06 MED ORDER — SODIUM CHLORIDE 0.9 % IV BOLUS (SEPSIS)
1000.0000 mL | Freq: Once | INTRAVENOUS | Status: AC
  Administered 2016-04-06: 1000 mL via INTRAVENOUS

## 2016-04-06 MED ORDER — DEXTROSE-NACL 5-0.45 % IV SOLN
INTRAVENOUS | Status: DC
  Administered 2016-04-06: 17:00:00 via INTRAVENOUS

## 2016-04-06 MED ORDER — SODIUM CHLORIDE 0.9 % IV SOLN
INTRAVENOUS | Status: DC
  Administered 2016-04-06 – 2016-04-07 (×3): via INTRAVENOUS

## 2016-04-06 MED ORDER — INSULIN REGULAR HUMAN 100 UNIT/ML IJ SOLN
INTRAMUSCULAR | Status: DC
  Administered 2016-04-06: 5.3 [IU]/h via INTRAVENOUS
  Filled 2016-04-06: qty 2.5

## 2016-04-06 MED ORDER — METOCLOPRAMIDE HCL 10 MG PO TABS
10.0000 mg | ORAL_TABLET | Freq: Three times a day (TID) | ORAL | Status: DC
  Administered 2016-04-06 – 2016-04-09 (×9): 10 mg via ORAL
  Filled 2016-04-06 (×9): qty 1

## 2016-04-06 MED ORDER — SODIUM CHLORIDE 0.9 % IV SOLN
INTRAVENOUS | Status: AC
  Administered 2016-04-06: 14:00:00 via INTRAVENOUS

## 2016-04-06 MED ORDER — BUSPIRONE HCL 5 MG PO TABS
5.0000 mg | ORAL_TABLET | Freq: Three times a day (TID) | ORAL | Status: DC
  Administered 2016-04-06 – 2016-04-09 (×9): 5 mg via ORAL
  Filled 2016-04-06 (×9): qty 1

## 2016-04-06 MED ORDER — DEXTROSE-NACL 5-0.45 % IV SOLN: INTRAVENOUS | Status: DC

## 2016-04-06 MED ORDER — INFLUENZA VAC SPLIT QUAD 0.5 ML IM SUSY
0.5000 mL | PREFILLED_SYRINGE | INTRAMUSCULAR | Status: AC
  Administered 2016-04-07: 0.5 mL via INTRAMUSCULAR
  Filled 2016-04-06: qty 0.5

## 2016-04-06 MED ORDER — INSULIN ASPART 100 UNIT/ML ~~LOC~~ SOLN
0.0000 [IU] | Freq: Three times a day (TID) | SUBCUTANEOUS | Status: DC
  Administered 2016-04-07: 3 [IU] via SUBCUTANEOUS
  Administered 2016-04-07: 5 [IU] via SUBCUTANEOUS
  Administered 2016-04-08 – 2016-04-09 (×4): 3 [IU] via SUBCUTANEOUS
  Administered 2016-04-09: 2 [IU] via SUBCUTANEOUS

## 2016-04-06 MED ORDER — INSULIN DETEMIR 100 UNIT/ML ~~LOC~~ SOLN
20.0000 [IU] | Freq: Once | SUBCUTANEOUS | Status: AC
  Administered 2016-04-06: 20 [IU] via SUBCUTANEOUS
  Filled 2016-04-06: qty 0.2

## 2016-04-06 MED ORDER — HEPARIN SODIUM (PORCINE) 5000 UNIT/ML IJ SOLN
5000.0000 [IU] | Freq: Three times a day (TID) | INTRAMUSCULAR | Status: DC
  Administered 2016-04-06 – 2016-04-09 (×8): 5000 [IU] via SUBCUTANEOUS
  Filled 2016-04-06 (×7): qty 1

## 2016-04-06 MED ORDER — SODIUM CHLORIDE 0.9 % IV SOLN
INTRAVENOUS | Status: DC
  Administered 2016-04-06: 4.5 [IU]/h via INTRAVENOUS
  Filled 2016-04-06: qty 2.5

## 2016-04-06 MED ORDER — LORAZEPAM 1 MG PO TABS
1.0000 mg | ORAL_TABLET | Freq: Three times a day (TID) | ORAL | Status: DC | PRN
Start: 2016-04-06 — End: 2016-04-09
  Administered 2016-04-07 – 2016-04-09 (×4): 1 mg via ORAL
  Filled 2016-04-06 (×4): qty 1

## 2016-04-06 MED ORDER — INSULIN ASPART 100 UNIT/ML ~~LOC~~ SOLN
0.0000 [IU] | Freq: Every day | SUBCUTANEOUS | Status: DC
  Administered 2016-04-07: 4 [IU] via SUBCUTANEOUS

## 2016-04-06 NOTE — ED Triage Notes (Signed)
Per EMS - patient from home where she lives with family.  Patient c/o N/V and abdominal cramping x2 days.  CBG on scene was 576.  Patient has been seen here in ED multiple times over past month.  Patient's HR 105-116, BP 136/77, 94% on RA.  Patient vomited bile with EMS en route.  IV attempt unsuccessful.

## 2016-04-06 NOTE — H&P (Signed)
TRH H&P   Patient Demographics:    [name], is a 50 y.o. female  MRN: [id number]   DOB - [birth year]  Admit Date - 04/06/2016  Outpatient Primary MD for the patient is [name], MD  Referring MD/NP/PA: PA Harris  Patient coming from: Home  Chief Complaint  Patient presents with  . Hyperglycemia      HPI:    [name]  is a 50 y.o. female, with medical history significant for type 1 diabetes mellitus, depression, anxiety, hypertension, and gastroparesis who presents the emergency department with hyperglycemia and nausea with vomiting, patient with recurrent admissions secondary to DKA because of noncompliance, recently discharged on 03/26/2016, she presents with complaints of abdominal pain, nausea, vomiting, and elevated CBG, she denies fever, chills, chest pain, palpitation, dyspnea or cough, reports because she has not been feeling well, took only 18 units of Levemir yesterday, did not take any pre-meal NovoLog. - In ED patient is a febrile, tachycardic, workup significant for DKA with anion gap of 24, and blood glucose of 692, given IV fluids, and started on insulin drip, hospitalist requested to admit    Review of systems:    In addition to the HPI above,  No Fever-chills, No Headache, No changes with Vision or hearing, No problems swallowing food or Liquids, No Chest pain, Cough or Shortness of Breath, Complains of abdominal pain, nausea and vomiting , Bowel movements are regular, No Blood in stool or Urine, No dysuria, No new skin rashes or bruises, No new joints pains-aches,  No new weakness, tingling, numbness in any extremity, No recent weight gain or loss, No polyuria, polydypsia or polyphagia, No significant Mental Stressors.  A full 10 point Review of Systems was done, except as stated above, all other Review of Systems were negative.   With  Past History of the following :    Past Medical History:  Diagnosis Date  . Depression   . Diabetes mellitus without complication ([place])   . Essential hypertension   . Gastroparesis   . GERD (gastroesophageal reflux disease)   . HLD (hyperlipidemia)       Past Surgical History:  Procedure Laterality Date  . COLONOSCOPY  09/27/2014   at [facility]  . ESOPHAGOGASTRODUODENOSCOPY  09/27/2014   at [facility], Dr Rolan Lipa. biospy neg for celiac, neg for H pylori.   . EYE SURGERY    . gailstones    . IR GENERIC HISTORICAL  01/24/2016   IR FLUORO GUIDE CV LINE RIGHT 01/24/2016 Darrell K Allred, PA-C [facility]  . IR GENERIC HISTORICAL  01/24/2016   IR US GUIDE VASC ACCESS RIGHT 01/24/2016 Darrell K Allred, PA-C [facility]  . POSTERIOR VITRECTOMY AND MEMBRANE PEEL-LEFT EYE  09/28/2002  . POSTERIOR VITRECTOMY AND MEMBRANE PEEL-RIGHT EYE  03/16/2002  . RETINAL DETACHMENT SURGERY        Social History:     Social  History  Substance Use Topics  . Smoking status: Never Smoker  . Smokeless tobacco: Never Used  . Alcohol use No     Lives -   Mobility -      Family History :     Family History  Problem Relation Age of Onset  . Cystic fibrosis Mother   . Hypertension Father   . Diabetes Brother   . Hypertension Maternal Grandmother       Home Medications:   Prior to Admission medications   Medication Sig Start Date End Date Taking? Authorizing Provider  albuterol (PROVENTIL HFA;VENTOLIN HFA) 108 (90 Base) MCG/ACT inhaler Inhale 2 puffs into the lungs every 6 (six) hours as needed for wheezing or shortness of breath. 10/20/15   [name], MD  Blood Glucose Monitoring Suppl (TRUE METRIX METER) DEVI 1 each by Does not apply route 3 (three) times daily before meals. 07/19/15   [name], MD  busPIRone (BUSPAR) 5 MG tablet Take 1 tablet (5 mg total) by mouth 3 (three) times daily. 01/31/16   [name], MD  docusate sodium (COLACE) 250 MG capsule Take 1 capsule (250 mg total) by  mouth 2 (two) times daily. 03/26/16   Donne Hazel, MD  erythromycin (E-MYCIN) 250 MG tablet Take 1 tablet (250 mg total) by mouth 3 (three) times daily with meals as needed. 03/20/16   Janece Canterbury, MD  ferrous sulfate 325 (65 FE) MG tablet Take 1 tablet (325 mg total) by mouth daily with breakfast. 03/27/16   Donne Hazel, MD  furosemide (LASIX) 40 MG tablet Take 40 mg by mouth daily as needed for fluid.     Historical Provider, MD  glucose blood (TRUE METRIX BLOOD GLUCOSE TEST) test strip Use 3 times daily before meals 03/13/16   [name], MD  insulin aspart (NOVOLOG) 100 UNIT/ML injection Inject 0-15 Units into the skin 3 (three) times daily with meals. Per sliding scale 01/31/16   [name], MD  insulin aspart (NOVOLOG) 100 UNIT/ML injection Inject 4 Units into the skin 3 (three) times daily with meals. 03/26/16   Donne Hazel, MD  insulin detemir (LEVEMIR) 100 UNIT/ML injection Inject 0.2 mLs (20 Units total) into the skin at bedtime. 03/26/16   Donne Hazel, MD  Insulin Syringe-Needle U-100 (BD INSULIN SYRINGE ULTRAFINE) 31G X 15/64" 0.5 ML MISC 1 each by Does not apply route 4 (four) times daily as needed. 08/16/15   [name], MD  LORazepam (ATIVAN) 1 MG tablet Take 1 tablet (1 mg total) by mouth 3 (three) times daily as needed for anxiety. 03/31/16   Nona Dell, PA-C  metoCLOPramide (REGLAN) 10 MG tablet Take 1 tablet (10 mg total) by mouth 3 (three) times daily before meals. 03/26/16   Donne Hazel, MD  ondansetron (ZOFRAN ODT) 4 MG disintegrating tablet Take 1 tablet (4 mg total) by mouth every 8 (eight) hours as needed for nausea or vomiting. 03/31/16   Nona Dell, PA-C  polyethylene glycol [facility] / Floria Raveling) packet Take 17 g by mouth daily as needed for mild constipation. 03/20/16   Janece Canterbury, MD  pregabalin (LYRICA) 150 MG capsule Take 1 capsule (150 mg total) by mouth 2 (two) times daily. 03/13/16   [name], MD     Allergies:       Allergies  Allergen Reactions  . Anesthetics, Amide Nausea And Vomiting  . Penicillins Diarrhea and Nausea And Vomiting    Has patient had a PCN reaction causing immediate  rash, facial/tongue/throat swelling, SOB or lightheadedness with hypotension: No Has patient had a PCN reaction causing severe rash involving mucus membranes or skin necrosis: No Has patient had a PCN reaction that required hospitalization No Has patient had a PCN reaction occurring within the last 10 years: Yes  If all of the above answers are "NO", then may proceed with Cephalosporin use.   . Buprenorphine Hcl Rash  . Encainide Nausea And Vomiting     Physical Exam:   Vitals  Blood pressure 147/60, pulse 112, temperature 98.2 F (36.8 C), temperature source Oral, resp. rate 24, height 5\' 2"  (1.575 m), weight 64.4 kg (142 lb), last menstrual period 02/15/2016, SpO2 100 %.   1. General Well-developed female lying in bed in NAD,    2. Normal affect and insight, Not Suicidal or Homicidal, Awake Alert, Oriented X 3.  3. No F.N deficits, ALL C.Nerves Intact, Strength 5/5 all 4 extremities, Sensation intact all 4 extremities, Plantars down going.  4. Ears and Eyes appear Normal, Conjunctivae clear, PERRLA. Moist Oral Mucosa.  5. Supple Neck, No JVD, No cervical lymphadenopathy appriciated, No Carotid Bruits.  6. Symmetrical Chest wall movement, Good air movement bilaterally, CTAB.  7. Tachycardic, No Gallops, Rubs or Murmurs, No Parasternal Heave.  8. Positive Bowel Sounds, Abdomen Soft, mild generalized tenderness, No organomegaly appriciated,No rebound -guarding or rigidity.  9.  No Cyanosis, Normal Skin Turgor, No Skin Rash or Bruise.  10. Good muscle tone,  joints appear normal , no effusions, Normal ROM.  11. No Palpable Lymph Nodes in Neck or Axillae   Data Review:    CBC  Recent Labs Lab 03/31/16 1652 04/06/16 1000 04/06/16 1021  WBC 7.6 15.1*  --   HGB 10.2* 12.2 15.3*  HCT 32.5* 40.1  45.0  PLT 373 504*  --   MCV 74.5* 77.6*  --   MCH 23.4* 23.6*  --   MCHC 31.4 30.4  --   RDW 20.7* 21.9*  --   LYMPHSABS  --  1.2  --   MONOABS  --  0.5  --   EOSABS  --  0.0  --   BASOSABS  --  0.0  --    ------------------------------------------------------------------------------------------------------------------  Chemistries   Recent Labs Lab 03/31/16 1652 04/06/16 1000 04/06/16 1021  NA 134* 132* 131*  K 3.5 4.9 4.9  CL 103 89* 93*  CO2 18* 19*  --   GLUCOSE 81 692* 658*  BUN 10 22* 23*  CREATININE 1.06* 1.41* 1.10*  CALCIUM 9.2 9.9  --   AST 13* 31  --   ALT 16 24  --   ALKPHOS 78 103  --   BILITOT 1.4* 2.0*  --    ------------------------------------------------------------------------------------------------------------------ estimated creatinine clearance is 55.1 mL/min (by C-G formula based on SCr of 1.1 mg/dL (H)). ------------------------------------------------------------------------------------------------------------------ No results for input(s): TSH, T4TOTAL, T3FREE, THYROIDAB in the last 72 hours.  Invalid input(s): FREET3  Coagulation profile No results for input(s): INR, PROTIME in the last 168 hours. ------------------------------------------------------------------------------------------------------------------- No results for input(s): DDIMER in the last 72 hours. -------------------------------------------------------------------------------------------------------------------  Cardiac Enzymes No results for input(s): CKMB, TROPONINI, MYOGLOBIN in the last 168 hours.  Invalid input(s): CK ------------------------------------------------------------------------------------------------------------------ No results found for: BNP   ---------------------------------------------------------------------------------------------------------------  Urinalysis    Component Value Date/Time   COLORURINE YELLOW 03/31/2016 1703    APPEARANCEUR CLOUDY (A) 03/31/2016 1703   LABSPEC 1.027 03/31/2016 1703   PHURINE 6.0 03/31/2016 1703   GLUCOSEU >1000 (A) 03/31/2016 1703   HGBUR NEGATIVE  03/31/2016 1703   BILIRUBINUR LARGE (A) 03/31/2016 1703   BILIRUBINUR NEGATIVE 08/04/2012 1005   KETONESUR >80 (A) 03/31/2016 1703   PROTEINUR 100 (A) 03/31/2016 1703   UROBILINOGEN 0.2 04/27/2015 1608   NITRITE NEGATIVE 03/31/2016 1703   LEUKOCYTESUR NEGATIVE 03/31/2016 1703    ----------------------------------------------------------------------------------------------------------------   Imaging Results:    No results found.   Assessment & Plan:    Active Problems:   DKA (diabetic ketoacidoses) ([place])    DKA, in type 1 DM  - Presents with serum glucose of 692, and anion gap of 24 - Agent currently is on her third liter of normal saline in ED, will give another 2 L as an Boaz bolus then we'll continue normal saline at 1 25 mL per hour - Will obtain VBG Check CBC every 4 hours until DKA is resolved - Admitted under DKA pathway, continue with insulin GTT - Monitor in stepdown unit     Diabetic gastroparesis / abdominal pain - She reports abdominal pain nausea and vomiting on admission, this is most likely related to DKA, but will continue her on Reglan  Pseudohyponatremia - Sodium within normal limits when corrected for glucose  Anxiety, depression - Continue Buspar    Leukocytosis, thrombocytosis - This is most likely due to dehydration and stress induced from DKA, afebrile  DVT Prophylaxis Heparin - SCD  AM Labs Ordered, also please review Full Orders  Family Communication: Admission, patients condition and plan of care including tests being ordered have been discussed with the patient who indicate understanding and agree with the plan and Code Status.  Code Status Full  Likely DC to  Home  Condition GUARDED   Consults called: None   Admission status: Inpatient  Time spent in minutes : 55  minutes   Mylisa Brunson M.D on 04/06/2016 at 12:44 PM  Between 7am to 7pm - Pager - [phone]. After 7pm go to [email or web address] - password [facility]  [facility] - Office  [phone]

## 2016-04-06 NOTE — Progress Notes (Addendum)
IV team RN attempted to start second IV utilizing ultrasound guidance. IV team RN unable to start PIV. Current IV was questionable for possible infiltrate, IV team RN checked IV site and was able to get blood return. Will continue to monitor.

## 2016-04-06 NOTE — ED Notes (Signed)
Bed: WA09 Expected date:  Expected time:  Means of arrival:  Comments: 50 yo hyperglycemia

## 2016-04-06 NOTE — ED Notes (Signed)
Informed RN of abnormal glucose

## 2016-04-06 NOTE — ED Provider Notes (Signed)
[facility] DEPT Provider Note   CSN: [id number] Arrival date & time: 04/06/16  0909     History   Chief Complaint Chief Complaint  Patient presents with  . Hyperglycemia    HPI [name] is a 50 y.o. female with type 1 diabetes who presents emergency Department with hyperglycemia. Patient with chief complaint of hyperglycemia. Patient is a type I diabetic.She has  has a past medical history of Depression; Diabetes mellitus without complication ([place]); Essential hypertension; Gastroparesis; GERD (gastroesophageal reflux disease); and HLD (hyperlipidemia).  She still states that she began having abdominal pain and vomiting last night. She has been persistently vomiting. Patient is breathing very quickly and states that she thinks it is her panic attacks. She has had a history of DKA and states that this feels similar. She denies any dysuria or hematuria, cough, URI symptoms, fevers, chills or other signs of systemic infection. Patient states that she did take a diuretic 2 days ago because her ankles were swollen.  HPI    Past Medical History:  Diagnosis Date  . Depression   . Diabetes mellitus without complication ([place])   . Essential hypertension   . Gastroparesis   . GERD (gastroesophageal reflux disease)   . HLD (hyperlipidemia)     Patient Active Problem List   Diagnosis Date Noted  . Type 1 diabetes mellitus with ketoacidosis without coma ([place])   . DKA (diabetic ketoacidoses) ([place]) 03/18/2016  . Noncompliance with treatment plan 03/13/2016  . Essential hypertension 01/24/2016  . Leukocytosis 01/24/2016  . Abnormal ECG 11/27/2015  . HLD (hyperlipidemia)   . GERD (gastroesophageal reflux disease)   . Metabolic acidosis, normal anion gap (NAG)   . AKI (acute kidney injury) ([place])   . Anemia, iron deficiency   . DKA, type 1, not at goal [facility])   . Hyponatremia 09/02/2015  . Vitamin B12 deficiency 08/16/2015  . Lactic acidosis 07/29/2015  . Acute pyelonephritis  07/29/2015  . Pyelonephritis   . Hypothermia   . Diabetic gastroparesis ([place])   . Diabetic neuropathy, type I diabetes mellitus ([place]) 05/18/2015  . Anemia 05/18/2015  . Anxiety and depression 05/18/2015  . Nausea & vomiting   . Diabetic ketoacidosis without coma associated with type 1 diabetes mellitus ([place])   . High anion gap metabolic acidosis [id number]    Past Surgical History:  Procedure Laterality Date  . COLONOSCOPY  09/27/2014   at [facility]  . ESOPHAGOGASTRODUODENOSCOPY  09/27/2014   at [facility], Dr Rolan Lipa. biospy neg for celiac, neg for H pylori.   . EYE SURGERY    . gailstones    . IR GENERIC HISTORICAL  01/24/2016   IR FLUORO GUIDE CV LINE RIGHT 01/24/2016 Darrell K Allred, PA-C [facility]-INTERV RAD  . IR GENERIC HISTORICAL  01/24/2016   IR US GUIDE VASC ACCESS RIGHT 01/24/2016 Darrell K Allred, PA-C [facility]  . POSTERIOR VITRECTOMY AND MEMBRANE PEEL-LEFT EYE  09/28/2002  . POSTERIOR VITRECTOMY AND MEMBRANE PEEL-RIGHT EYE  03/16/2002  . RETINAL DETACHMENT SURGERY      OB History    No data available       Home Medications    Prior to Admission medications   Medication Sig Start Date End Date Taking? Authorizing Provider  albuterol (PROVENTIL HFA;VENTOLIN HFA) 108 (90 Base) MCG/ACT inhaler Inhale 2 puffs into the lungs every 6 (six) hours as needed for wheezing or shortness of breath. 10/20/15  Yes [name], MD  busPIRone (BUSPAR) 5 MG tablet Take 1 tablet (5 mg total) by  mouth 3 (three) times daily. 01/31/16  Yes [name], MD  docusate sodium (COLACE) 250 MG capsule Take 1 capsule (250 mg total) by mouth 2 (two) times daily. 03/26/16  Yes Donne Hazel, MD  ferrous sulfate 325 (65 FE) MG tablet Take 1 tablet (325 mg total) by mouth daily with breakfast. 03/27/16  Yes Donne Hazel, MD  furosemide (LASIX) 40 MG tablet Take 40 mg by mouth daily as needed for fluid.    Yes Historical Provider, MD  insulin aspart (NOVOLOG) 100 UNIT/ML injection Inject 0-15 Units into the  skin 3 (three) times daily with meals. Per sliding scale Patient taking differently: Inject 4-15 Units into the skin 3 (three) times daily with meals. Per sliding scale 01/31/16  Yes [name], MD  insulin detemir (LEVEMIR) 100 UNIT/ML injection Inject 0.2 mLs (20 Units total) into the skin at bedtime. 03/26/16  Yes Donne Hazel, MD  LORazepam (ATIVAN) 1 MG tablet Take 1 tablet (1 mg total) by mouth 3 (three) times daily as needed for anxiety. 03/31/16  Yes Nona Dell, PA-C  metoCLOPramide (REGLAN) 10 MG tablet Take 1 tablet (10 mg total) by mouth 3 (three) times daily before meals. 03/26/16  Yes Donne Hazel, MD  ondansetron (ZOFRAN ODT) 4 MG disintegrating tablet Take 1 tablet (4 mg total) by mouth every 8 (eight) hours as needed for nausea or vomiting. 03/31/16  Yes Nona Dell, PA-C  polyethylene glycol [facility] / GLYCOLAX) packet Take 17 g by mouth daily as needed for mild constipation. 03/20/16  Yes Janece Canterbury, MD  pregabalin (LYRICA) 150 MG capsule Take 1 capsule (150 mg total) by mouth 2 (two) times daily. 03/13/16  Yes [name], MD  Blood Glucose Monitoring Suppl (TRUE METRIX METER) DEVI 1 each by Does not apply route 3 (three) times daily before meals. 07/19/15   [name], MD  erythromycin (E-MYCIN) 250 MG tablet Take 1 tablet (250 mg total) by mouth 3 (three) times daily with meals as needed. 03/20/16   Janece Canterbury, MD  glucose blood (TRUE METRIX BLOOD GLUCOSE TEST) test strip Use 3 times daily before meals 03/13/16   [name], MD  Insulin Syringe-Needle U-100 (BD INSULIN SYRINGE ULTRAFINE) 31G X 15/64" 0.5 ML MISC 1 each by Does not apply route 4 (four) times daily as needed. 08/16/15   [name], MD    Family History Family History  Problem Relation Age of Onset  . Cystic fibrosis Mother   . Hypertension Father   . Diabetes Brother   . Hypertension Maternal Grandmother     Social History Social History  Substance Use Topics  . Smoking  status: Never Smoker  . Smokeless tobacco: Never Used  . Alcohol use No     Allergies   Anesthetics, amide; Penicillins; Buprenorphine hcl; and Encainide   Review of Systems Review of Systems  Ten systems reviewed and are negative for acute change, except as noted in the HPI.   Physical Exam Updated Vital Signs BP (!) 165/68 (BP Location: Left Arm)   Pulse (!) 116   Temp 97.4 F (36.3 C) (Oral)   Resp 20   Ht 5\' 2"  (1.575 m)   Wt 62.2 kg   LMP 02/15/2016 (Approximate)   SpO2 100%   BMI 25.08 kg/m   Physical Exam  Constitutional: She is oriented to person, place, and time. She appears well-developed and well-nourished. No distress.  Smells of ketones  HENT:  Head: Normocephalic and atraumatic.  Dry oral  mucosa   Eyes: Conjunctivae and EOM are normal. Pupils are equal, round, and reactive to light. No scleral icterus.  Neck: Normal range of motion.  Cardiovascular: Normal rate, regular rhythm and normal heart sounds.  Exam reveals no gallop and no friction rub.   No murmur heard. Pulmonary/Chest: No respiratory distress.  Tachypnea with Kussmaul breathing  Abdominal: Soft. Bowel sounds are normal. She exhibits no distension and no mass. There is no tenderness. There is no guarding.  Neurological: She is alert and oriented to person, place, and time.  Skin: Skin is warm and dry. She is not diaphoretic.  Nursing note and vitals reviewed.    ED Treatments / Results  Labs (all labs ordered are listed, but only abnormal results are displayed) Labs Reviewed  CBC WITH DIFFERENTIAL/PLATELET - Abnormal; Notable for the following:       Result Value   WBC 15.1 (*)    RBC 5.17 (*)    MCV 77.6 (*)    MCH 23.6 (*)    RDW 21.9 (*)    Platelets 504 (*)    Neutro Abs 13.4 (*)    All other components within normal limits  COMPREHENSIVE METABOLIC PANEL - Abnormal; Notable for the following:    Sodium 132 (*)    Chloride 89 (*)    CO2 19 (*)    Glucose, Bld 692 (*)     BUN 22 (*)    Creatinine, Ser 1.41 (*)    Total Protein 8.7 (*)    Total Bilirubin 2.0 (*)    GFR calc non Af Amer 43 (*)    GFR calc Af Amer 50 (*)    Anion gap 24 (*)    All other components within normal limits  BLOOD GAS, ARTERIAL - Abnormal; Notable for the following:    pH, Arterial 7.277 (*)    Bicarbonate 15.2 (*)    Acid-base deficit 10.3 (*)    All other components within normal limits  GLUCOSE, CAPILLARY - Abnormal; Notable for the following:    Glucose-Capillary 439 (*)    All other components within normal limits  CBG MONITORING, ED - Abnormal; Notable for the following:    Glucose-Capillary >600 (*)    All other components within normal limits  I-STAT CHEM 8, ED - Abnormal; Notable for the following:    Sodium 131 (*)    Chloride 93 (*)    BUN 23 (*)    Creatinine, Ser 1.10 (*)    Glucose, Bld 658 (*)    Calcium, Ion 1.10 (*)    Hemoglobin 15.3 (*)    All other components within normal limits  CBG MONITORING, ED - Abnormal; Notable for the following:    Glucose-Capillary 585 (*)    All other components within normal limits  CBG MONITORING, ED - Abnormal; Notable for the following:    Glucose-Capillary 506 (*)    All other components within normal limits  URINALYSIS, ROUTINE W REFLEX MICROSCOPIC (NOT AT [facility])  BASIC METABOLIC PANEL  BASIC METABOLIC PANEL  BASIC METABOLIC PANEL  I-STAT BETA HCG BLOOD, ED ([facility], [facility], [facility] ONLY)    EKG  EKG Interpretation None       Radiology No results found.  Procedures Procedures (including critical care time)  Medications Ordered in ED Medications  sodium chloride 0.9 % bolus 1,000 mL (0 mLs Intravenous Stopped 04/06/16 1100)    And  sodium chloride 0.9 % bolus 1,000 mL (0 mLs Intravenous Stopped 04/06/16 1220)    And  0.9 %  sodium chloride infusion ( Intravenous Duplicate [id number])  dextrose 5 %-0.45 % sodium chloride infusion ( Intravenous Duplicate [id number])  insulin regular (NOVOLIN R,HUMULIN R)  250 Units in sodium chloride 0.9 % 250 mL (1 Units/mL) infusion (4.5 Units/hr Intravenous Rate/Dose Change 04/06/16 1333)  LORazepam (ATIVAN) tablet 1 mg (not administered)  ondansetron (ZOFRAN-ODT) disintegrating tablet 4 mg (4 mg Oral Given 04/06/16 1423)  docusate sodium (COLACE) capsule 250 mg (not administered)  ferrous sulfate tablet 325 mg (not administered)  polyethylene glycol (MIRALAX / GLYCOLAX) packet 17 g (not administered)  pregabalin (LYRICA) capsule 150 mg (150 mg Oral Given 04/06/16 1422)  busPIRone (BUSPAR) tablet 5 mg (5 mg Oral Given 04/06/16 1538)  0.9 %  sodium chloride infusion ( Intravenous New Bag/Given 04/06/16 1421)  0.9 %  sodium chloride infusion ( Intravenous New Bag/Given 04/06/16 1437)  dextrose 5 %-0.45 % sodium chloride infusion (not administered)  insulin regular (NOVOLIN R,HUMULIN R) 250 Units in sodium chloride 0.9 % 250 mL (1 Units/mL) infusion (5.5 Units/hr Intravenous Rate/Dose Change 04/06/16 1535)  heparin injection 5,000 Units (not administered)  potassium chloride 10 mEq in 100 mL IVPB (10 mEq Intravenous Given 04/06/16 1539)  metoCLOPramide (REGLAN) tablet 10 mg (not administered)  Influenza vac split quadrivalent PF (FLUARIX) injection 0.5 mL (not administered)  LORazepam (ATIVAN) injection 1 mg (1 mg Intravenous Given 04/06/16 1013)  sodium chloride 0.9 % bolus 1,000 mL (0 mLs Intravenous Stopped 04/06/16 1332)  sodium chloride 0.9 % bolus 1,000 mL (1,000 mLs Intravenous Transfusing/Transfer 04/06/16 1333)     Initial Impression / Assessment and Plan / ED Course  I have reviewed the triage vital signs and the nursing notes.  Pertinent labs & imaging results that were available during my care of the patient were reviewed by me and considered in my medical decision making (see chart for details).  Clinical Course    Patient with hyperglycemia, high anion gap metabolic acidosis. Unable to obtain ABG at this time, however, she has Kussmaul  breathing and appears to be in DKA. I have given the patient fluids. And she was started on an insulin drip. Patient will be admitted to stepdown unit by Dr. Urban Gibson.  Final Clinical Impressions(s) / ED Diagnoses   Final diagnoses:  Diabetic ketoacidosis without coma associated with type 1 diabetes mellitus [facility])    New Prescriptions Current Discharge Medication List       Margarita Mail, PA-C 04/06/16 1628    Davonna Belling, MD 04/09/16 [phone]

## 2016-04-06 NOTE — Progress Notes (Signed)
Most recent BMET resulted. Dr. Waldron Labs paged regarding results. Will continue to monitor.

## 2016-04-07 LAB — BASIC METABOLIC PANEL
Anion gap: 6 (ref 5–15)
BUN: 18 mg/dL (ref 6–20)
CHLORIDE: 110 mmol/L (ref 101–111)
CO2: 21 mmol/L — ABNORMAL LOW (ref 22–32)
CREATININE: 1.21 mg/dL — AB (ref 0.44–1.00)
Calcium: 8.3 mg/dL — ABNORMAL LOW (ref 8.9–10.3)
GFR calc Af Amer: >60 mL/min (ref >60)
GFR calc non Af Amer: 52 mL/min — ABNORMAL LOW (ref >60)
GLUCOSE: 101 mg/dL — AB (ref 65–99)
POTASSIUM: 4.2 mmol/L (ref 3.5–5.1)
SODIUM: 137 mmol/L (ref 135–145)

## 2016-04-07 LAB — GLUCOSE, CAPILLARY
GLUCOSE-CAPILLARY: 72 mg/dL (ref 65–99)
Glucose-Capillary: 199 mg/dL — ABNORMAL HIGH (ref 65–99)
Glucose-Capillary: 230 mg/dL — ABNORMAL HIGH (ref 65–99)
Glucose-Capillary: 310 mg/dL — ABNORMAL HIGH (ref 65–99)

## 2016-04-07 LAB — CBC
HEMATOCRIT: 28.7 % — AB (ref 36.0–46.0)
Hemoglobin: 8.8 g/dL — ABNORMAL LOW (ref 12.0–15.0)
MCH: 23.5 pg — ABNORMAL LOW (ref 26.0–34.0)
MCHC: 30.7 g/dL (ref 30.0–36.0)
MCV: 76.5 fL — AB (ref 78.0–100.0)
PLATELETS: 392 10*3/uL (ref 150–400)
RBC: 3.75 MIL/uL — ABNORMAL LOW (ref 3.87–5.11)
RDW: 22.2 % — AB (ref 11.5–15.5)
WBC: 7.2 10*3/uL (ref 4.0–10.5)

## 2016-04-07 MED ORDER — INSULIN DETEMIR 100 UNIT/ML ~~LOC~~ SOLN
22.0000 [IU] | Freq: Every day | SUBCUTANEOUS | Status: DC
  Administered 2016-04-07 – 2016-04-08 (×2): 22 [IU] via SUBCUTANEOUS
  Filled 2016-04-07 (×3): qty 0.22

## 2016-04-07 NOTE — Progress Notes (Signed)
PROGRESS NOTE    [name]  [id number] DOB: 01/25/1966 DOA: 04/06/2016 PCP: [name], MD    Brief Narrative: [name]  is a 50 y.o. female, with medical history significant for type 1 diabetes mellitus, depression, anxiety, hypertension, and gastroparesis who presents the emergency department with hyperglycemia and nausea with vomiting, patient with recurrent admissions secondary to DKA because of noncompliance,.   Assessment & Plan:   Active Problems:   DKA (diabetic ketoacidoses) ([place])   DKA:  Resolved with gluco stabilizer.  Gap is closed.  normal electrolytes.  Bicarb improving, level is 21. Increased her levemir to 22 units tonight, and resume SSI.  CBG (last 3)   Recent Labs  04/06/16 2225 04/07/16 0828 04/07/16 1239  GLUCAP 160* 72 199*       Hypertension:  Controlled.    Gastroparesis: Currently no vomiting, slightly nauseated.  Resume reglan, .    DVT prophylaxis: Heparin Code Status: full code.  Family Communication: none at home.  Disposition Plan: possibly home in am.    Consultants:   None.    Procedures:none    Antimicrobials: none    Subjective: Reports a lot of stress at home, worsening gastroparesis episodes .  Objective: Vitals:   04/07/16 0600 04/07/16 0800 04/07/16 0900 04/07/16 1000  BP: (!) 119/56 125/63  116/65  Pulse: 86 87 88 81  Resp: 14 18 (!) 25 18  Temp:  99.5 F (37.5 C)    TempSrc:  Oral    SpO2: 95% 100% 99% 100%  Weight:      Height:        Intake/Output Summary (Last 24 hours) at 04/07/16 1409 Last data filed at 04/07/16 1000  Gross per 24 hour  Intake          2435.65 ml  Output              800 ml  Net          1635.65 ml   Filed Weights   04/06/16 1220 04/06/16 1400  Weight: 64.4 kg (142 lb) 62.2 kg (137 lb 2 oz)    Examination:  General exam: Appears calm and comfortable  Respiratory system: Clear to auscultation. Respiratory effort normal. Cardiovascular system: S1 &  S2 heard, RRR. No JVD, murmurs, rubs, gallops or clicks. No pedal edema. Gastrointestinal system: Abdomen is nondistended, soft and nontender. No organomegaly or masses felt. Normal bowel sounds heard. Central nervous system: Alert and oriented. No focal neurological deficits. Extremities: Symmetric 5 x 5 power. Skin: No rashes, lesions or ulcers     Data Reviewed: I have personally reviewed following labs and imaging studies  CBC:  Recent Labs Lab 03/31/16 1652 04/06/16 1000 04/06/16 1021 04/07/16 0355  WBC 7.6 15.1*  --  7.2  NEUTROABS  --  13.4*  --   --   HGB 10.2* 12.2 15.3* 8.8*  HCT 32.5* 40.1 45.0 28.7*  MCV 74.5* 77.6*  --  76.5*  PLT 373 504*  --  0000000   Basic Metabolic Panel:  Recent Labs Lab 03/31/16 1652 04/06/16 1000 04/06/16 1021 04/06/16 1706 04/07/16 0355  NA 134* 132* 131* 141 137  K 3.5 4.9 4.9 4.7 4.2  CL 103 89* 93* 113* 110  CO2 18* 19*  --  20* 21*  GLUCOSE 81 692* 658* 223* 101*  BUN 10 22* 23* 20 18  CREATININE 1.06* 1.41* 1.10* 1.08* 1.21*  CALCIUM 9.2 9.9  --  8.4* 8.3*   GFR: Estimated Creatinine Clearance: 49.3  mL/min (by C-G formula based on SCr of 1.21 mg/dL (H)). Liver Function Tests:  Recent Labs Lab 03/31/16 1652 04/06/16 1000  AST 13* 31  ALT 16 24  ALKPHOS 78 103  BILITOT 1.4* 2.0*  PROT 7.1 8.7*  ALBUMIN 3.6 4.6    Recent Labs Lab 03/31/16 1652  LIPASE 25   No results for input(s): AMMONIA in the last 168 hours. Coagulation Profile: No results for input(s): INR, PROTIME in the last 168 hours. Cardiac Enzymes: No results for input(s): CKTOTAL, CKMB, CKMBINDEX, TROPONINI in the last 168 hours. BNP (last 3 results) No results for input(s): PROBNP in the last 8760 hours. HbA1C: No results for input(s): HGBA1C in the last 72 hours. CBG:  Recent Labs Lab 04/06/16 2006 04/06/16 2115 04/06/16 2225 04/07/16 0828 04/07/16 1239  GLUCAP 180* 166* 160* 72 199*   Lipid Profile: No results for input(s): CHOL,  HDL, LDLCALC, TRIG, CHOLHDL, LDLDIRECT in the last 72 hours. Thyroid Function Tests: No results for input(s): TSH, T4TOTAL, FREET4, T3FREE, THYROIDAB in the last 72 hours. Anemia Panel: No results for input(s): VITAMINB12, FOLATE, FERRITIN, TIBC, IRON, RETICCTPCT in the last 72 hours. Sepsis Labs: No results for input(s): PROCALCITON, LATICACIDVEN in the last 168 hours.  No results found for this or any previous visit (from the past 240 hour(s)).       Radiology Studies: No results found.      Scheduled Meds: . busPIRone  5 mg Oral TID  . docusate sodium  250 mg Oral BID  . ferrous sulfate  325 mg Oral Q breakfast  . heparin  5,000 Units Subcutaneous Q8H  . insulin aspart  0-15 Units Subcutaneous TID WC  . insulin aspart  0-5 Units Subcutaneous QHS  . insulin detemir  22 Units Subcutaneous QHS  . metoCLOPramide  10 mg Oral TID AC  . pregabalin  150 mg Oral BID   Continuous Infusions: . sodium chloride    . sodium chloride 75 mL/hr at 04/07/16 1137     LOS: 1 day    Time spent: 25 minutes.     Hosie Poisson, MD [facility] Pager [phone]   If 7PM-7AM, please contact night-coverage [email or web address] Password [email or web address] 04/07/2016, 2:09 PM

## 2016-04-07 NOTE — Progress Notes (Signed)
Pt. arrived to floor from ICU. Alert and oriented x 4. Family at bedside. No respiratory distress noted.

## 2016-04-08 LAB — GLUCOSE, CAPILLARY
GLUCOSE-CAPILLARY: 155 mg/dL — AB (ref 65–99)
GLUCOSE-CAPILLARY: 162 mg/dL — AB (ref 65–99)
Glucose-Capillary: 156 mg/dL — ABNORMAL HIGH (ref 65–99)
Glucose-Capillary: 151 mg/dL — ABNORMAL HIGH (ref 65–99)

## 2016-04-08 LAB — CBC
HCT: 29.3 % — ABNORMAL LOW (ref 36.0–46.0)
Hemoglobin: 9 g/dL — ABNORMAL LOW (ref 12.0–15.0)
MCH: 23.5 pg — ABNORMAL LOW (ref 26.0–34.0)
MCHC: 30.7 g/dL (ref 30.0–36.0)
MCV: 76.5 fL — AB (ref 78.0–100.0)
PLATELETS: UNDETERMINED 10*3/uL (ref 150–400)
RBC: 3.83 MIL/uL — ABNORMAL LOW (ref 3.87–5.11)
RDW: 21.9 % — AB (ref 11.5–15.5)
WBC: 4.4 10*3/uL (ref 4.0–10.5)

## 2016-04-08 LAB — BASIC METABOLIC PANEL
ANION GAP: 7 (ref 5–15)
BUN: 12 mg/dL (ref 6–20)
CALCIUM: 8.7 mg/dL — AB (ref 8.9–10.3)
CO2: 20 mmol/L — ABNORMAL LOW (ref 22–32)
CREATININE: 1.06 mg/dL — AB (ref 0.44–1.00)
Chloride: 109 mmol/L (ref 101–111)
GFR calc Af Amer: >60 mL/min (ref >60)
GLUCOSE: 213 mg/dL — AB (ref 65–99)
Potassium: 4.4 mmol/L (ref 3.5–5.1)
Sodium: 136 mmol/L (ref 135–145)

## 2016-04-08 MED ORDER — INSULIN ASPART 100 UNIT/ML ~~LOC~~ SOLN
4.0000 [IU] | Freq: Three times a day (TID) | SUBCUTANEOUS | Status: DC
  Administered 2016-04-08 – 2016-04-09 (×3): 4 [IU] via SUBCUTANEOUS

## 2016-04-08 NOTE — Progress Notes (Signed)
PROGRESS NOTE    [name]  [id number] DOB: 22-Oct-1965 DOA: 04/06/2016 PCP: [name], MD    Brief Narrative: [name]  is a 50 y.o. female, with medical history significant for type 1 diabetes mellitus, depression, anxiety, hypertension, and gastroparesis who presents the emergency department with hyperglycemia and nausea with vomiting, patient with recurrent admissions secondary to DKA because of noncompliance,.   Assessment & Plan:   Active Problems:   DKA (diabetic ketoacidoses) ([place])   DKA:  Resolved with gluco stabilizer.  Gap is closed.  normal electrolytes.  Bicarb improving, . Increased her levemir to 22 units tonight, and resume SSI.  CBG (last 3)   Recent Labs  04/07/16 2139 04/08/16 0749 04/08/16 1230  GLUCAP 310* 155* 162*    Added premeal novolog .    Hypertension:  Controlled.    Gastroparesis: Currently no vomiting, slightly nauseated.  Resume reglan, . No changes.    Acute renal failure:from nausea, vomiting . Gi loses.  Improved with hydration.  Repeat labs in am.    Deconditioning: PT ORDERED.   Generalized anxiety disorder: On Buspar and ativan.   DVT prophylaxis: Heparin Code Status: full code.  Family Communication: none at home.  Disposition Plan: possibly home in am.    Consultants:   None.    Procedures:none    Antimicrobials: none    Subjective: REPORTS worsening anxiety.   Objective: Vitals:   04/07/16 1531 04/07/16 2135 04/08/16 0602 04/08/16 1021  BP: 124/67 129/69 138/78 (!) 152/89  Pulse: 76 74 87 83  Resp: 16 16 16 17   Temp: 98.6 F (37 C) 98 F (36.7 C) 98.2 F (36.8 C) 98.3 F (36.8 C)  TempSrc: Oral Oral Oral Oral  SpO2: 100% 97% 100% 100%  Weight: 64.9 kg (143 lb)     Height: 5\' 2"  (1.575 m)       Intake/Output Summary (Last 24 hours) at 04/08/16 1419 Last data filed at 04/08/16 1234  Gross per 24 hour  Intake          2213.75 ml  Output             3900 ml  Net          -1686.25 ml   Filed Weights   04/06/16 1220 04/06/16 1400 04/07/16 1531  Weight: 64.4 kg (142 lb) 62.2 kg (137 lb 2 oz) 64.9 kg (143 lb)    Examination:  General exam: Appears calm and comfortable  Respiratory system: Clear to auscultation. Respiratory effort normal. Cardiovascular system: S1 & S2 heard, RRR. No JVD, murmurs, rubs, gallops or clicks. No pedal edema. Gastrointestinal system: Abdomen is nondistended, soft and nontender. No organomegaly or masses felt. Normal bowel sounds heard. Central nervous system: Alert and oriented. No focal neurological deficits. Extremities: Symmetric 5 x 5 power. Skin: No rashes, lesions or ulcers     Data Reviewed: I have personally reviewed following labs and imaging studies  CBC:  Recent Labs Lab 04/06/16 1000 04/06/16 1021 04/07/16 0355 04/08/16 0344  WBC 15.1*  --  7.2 4.4  NEUTROABS 13.4*  --   --   --   HGB 12.2 15.3* 8.8* 9.0*  HCT 40.1 45.0 28.7* 29.3*  MCV 77.6*  --  76.5* 76.5*  PLT 504*  --  392 PLATELET CLUMPS NOTED ON SMEAR, UNABLE TO ESTIMATE   Basic Metabolic Panel:  Recent Labs Lab 04/06/16 1000 04/06/16 1021 04/06/16 1706 04/07/16 0355 04/08/16 0344  NA 132* 131* 141 137 136  K 4.9 4.9  4.7 4.2 4.4  CL 89* 93* 113* 110 109  CO2 19*  --  20* 21* 20*  GLUCOSE 692* 658* 223* 101* 213*  BUN 22* 23* 20 18 12   CREATININE 1.41* 1.10* 1.08* 1.21* 1.06*  CALCIUM 9.9  --  8.4* 8.3* 8.7*   GFR: Estimated Creatinine Clearance: 57.4 mL/min (by C-G formula based on SCr of 1.06 mg/dL (H)). Liver Function Tests:  Recent Labs Lab 04/06/16 1000  AST 31  ALT 24  ALKPHOS 103  BILITOT 2.0*  PROT 8.7*  ALBUMIN 4.6   No results for input(s): LIPASE, AMYLASE in the last 168 hours. No results for input(s): AMMONIA in the last 168 hours. Coagulation Profile: No results for input(s): INR, PROTIME in the last 168 hours. Cardiac Enzymes: No results for input(s): CKTOTAL, CKMB, CKMBINDEX, TROPONINI in the last 168  hours. BNP (last 3 results) No results for input(s): PROBNP in the last 8760 hours. HbA1C: No results for input(s): HGBA1C in the last 72 hours. CBG:  Recent Labs Lab 04/07/16 1239 04/07/16 1643 04/07/16 2139 04/08/16 0749 04/08/16 1230  GLUCAP 199* 230* 310* 155* 162*   Lipid Profile: No results for input(s): CHOL, HDL, LDLCALC, TRIG, CHOLHDL, LDLDIRECT in the last 72 hours. Thyroid Function Tests: No results for input(s): TSH, T4TOTAL, FREET4, T3FREE, THYROIDAB in the last 72 hours. Anemia Panel: No results for input(s): VITAMINB12, FOLATE, FERRITIN, TIBC, IRON, RETICCTPCT in the last 72 hours. Sepsis Labs: No results for input(s): PROCALCITON, LATICACIDVEN in the last 168 hours.  No results found for this or any previous visit (from the past 240 hour(s)).       Radiology Studies: No results found.      Scheduled Meds: . busPIRone  5 mg Oral TID  . docusate sodium  250 mg Oral BID  . ferrous sulfate  325 mg Oral Q breakfast  . heparin  5,000 Units Subcutaneous Q8H  . insulin aspart  0-15 Units Subcutaneous TID WC  . insulin aspart  0-5 Units Subcutaneous QHS  . insulin aspart  4 Units Subcutaneous TID WC  . insulin detemir  22 Units Subcutaneous QHS  . metoCLOPramide  10 mg Oral TID AC  . pregabalin  150 mg Oral BID   Continuous Infusions:     LOS: 2 days    Time spent: 25 minutes.     Hosie Poisson, MD [facility] Pager [phone]   If 7PM-7AM, please contact night-coverage [email or web address] Password [email or web address] 04/08/2016, 2:19 PM

## 2016-04-09 ENCOUNTER — Telehealth: Payer: Self-pay

## 2016-04-09 LAB — GLUCOSE, CAPILLARY
GLUCOSE-CAPILLARY: 137 mg/dL — AB (ref 65–99)
Glucose-Capillary: 189 mg/dL — ABNORMAL HIGH (ref 65–99)

## 2016-04-09 MED ORDER — INSULIN DETEMIR 100 UNIT/ML ~~LOC~~ SOLN: 22.0000 [IU] | Freq: Every day | SUBCUTANEOUS | 11 refills | Status: DC

## 2016-04-09 MED ORDER — INSULIN ASPART 100 UNIT/ML ~~LOC~~ SOLN: 4.0000 [IU] | Freq: Three times a day (TID) | SUBCUTANEOUS | 11 refills | Status: DC

## 2016-04-09 MED FILL — !LEVEMIR 100 UNITS/ML VIAL: 100/ML | 44 days supply | Qty: 10 | Fill #0

## 2016-04-09 MED FILL — !NOVOLOG 100UNITS/ML VIAL: 100/ML | 30 days supply | Qty: 10 | Fill #0

## 2016-04-09 NOTE — Care Management Note (Signed)
Case Management Note  Patient Details  Name: [name] MRN: [id number] Date of Birth: Aug 11, 1965  Subjective/Objective:           50 yo admitted with DKA         Action/Plan: Pt from home with sister. Pt is active with [facility] and this CM scheduled her a follow up appointment for tomorrow. This appointment was placed on AVS. Pt was working on getting her orange card last time she was in the hospital and she states she still has not been able to get it due to being unable to get to the bank to get the paperwork. Pt states she has insulin at home and has been eating what she's supposed to. This CM again stressed the importance of good follow up with her PCP. Pt has had multiple admissions due to compliance.  Expected Discharge Date:   (unknown)               Expected Discharge Plan:     In-House Referral:     Discharge planning Services     Post Acute Care Choice:    Choice offered to:     DME Arranged:    DME Agency:     HH Arranged:    HH Agency:     Status of Service:     If discussed at [employer], dates discussed:    Additional CommentsLynnell Catalan, RN 04/09/2016, 9:41 AM  [phone]

## 2016-04-09 NOTE — Discharge Summary (Signed)
Physician Discharge Summary  [name] [id number] DOB: 09/23/1965 DOA: 04/06/2016  PCP: [name], MD  Admit date: 04/06/2016 Discharge date: 04/09/2016  Admitted From: Home.  Disposition:  Home  Recommendations for Outpatient Follow-up:  1. Follow up with PCP in 1-2 weeks 2. Please obtain BMP/CBC in one week 3. You will need follow up with gastroenterology in one week.   Home Health:yes  Discharge Condition:stable.  CODE STATUS:full code.  Diet recommendation:  Carb Modified  Brief/Interim Summary: [name] a 50 y.o.female,with medical history significant for type 1 diabetes mellitus, depression, anxiety, hypertension, and gastroparesis who presents the emergency department with hyperglycemia and nausea with vomiting, patient with recurrent admissions secondary to DKA because of noncompliance,.   Discharge Diagnoses:  Active Problems:   DKA (diabetic ketoacidoses) ([place])  DKA:  Resolved with gluco stabilizer.  Gap is closed.  normal electrolytes.  Bicarb improving, . Increased her levemir to 22 units tonight, and resume SSI.  CBG (last 3)   Recent Labs  04/08/16 1745 04/08/16 2122 04/09/16 0804  GLUCAP 156* 151* 137*        Added premeal novolog 4 units for discharge.  .    Hypertension:  Controlled.    Gastroparesis: Currently no vomiting, slightly nauseated.  Resume reglan, and erythromycin.    Acute renal failure:from nausea, vomiting . Gi loses.  Improved with hydration.     Deconditioning: PT ordered.   Generalized anxiety disorder: On Buspar and ativan.    Discharge Instructions  Discharge Instructions    Diet Carb Modified    Complete by:  As directed    Discharge instructions    Complete by:  As directed    Please follow up with PCP in one week.       Medication List    TAKE these medications   albuterol 108 (90 Base) MCG/ACT inhaler Commonly known as:  PROVENTIL HFA;VENTOLIN HFA Inhale 2  puffs into the lungs every 6 (six) hours as needed for wheezing or shortness of breath.   busPIRone 5 MG tablet Commonly known as:  BUSPAR Take 1 tablet (5 mg total) by mouth 3 (three) times daily.   docusate sodium 250 MG capsule Commonly known as:  COLACE Take 1 capsule (250 mg total) by mouth 2 (two) times daily.   erythromycin 250 MG tablet Commonly known as:  E-MYCIN Take 1 tablet (250 mg total) by mouth 3 (three) times daily with meals as needed.   ferrous sulfate 325 (65 FE) MG tablet Take 1 tablet (325 mg total) by mouth daily with breakfast.   furosemide 40 MG tablet Commonly known as:  LASIX Take 40 mg by mouth daily as needed for fluid.   glucose blood test strip Commonly known as:  TRUE METRIX BLOOD GLUCOSE TEST Use 3 times daily before meals   insulin aspart 100 UNIT/ML injection Commonly known as:  novoLOG Inject 0-15 Units into the skin 3 (three) times daily with meals. Per sliding scale What changed:  how much to take  additional instructions   insulin aspart 100 UNIT/ML injection Commonly known as:  novoLOG Inject 4 Units into the skin 3 (three) times daily with meals. What changed:  You were already taking a medication with the same name, and this prescription was added. Make sure you understand how and when to take each.   insulin detemir 100 UNIT/ML injection Commonly known as:  LEVEMIR Inject 0.22 mLs (22 Units total) into the skin at bedtime. What changed:  how much to take  Insulin Syringe-Needle U-100 31G X 15/64" 0.5 ML Misc Commonly known as:  BD INSULIN SYRINGE ULTRAFINE 1 each by Does not apply route 4 (four) times daily as needed.   LORazepam 1 MG tablet Commonly known as:  ATIVAN Take 1 tablet (1 mg total) by mouth 3 (three) times daily as needed for anxiety.   metoCLOPramide 10 MG tablet Commonly known as:  REGLAN Take 1 tablet (10 mg total) by mouth 3 (three) times daily before meals.   ondansetron 4 MG disintegrating  tablet Commonly known as:  ZOFRAN ODT Take 1 tablet (4 mg total) by mouth every 8 (eight) hours as needed for nausea or vomiting.   polyethylene glycol packet Commonly known as:  MIRALAX / GLYCOLAX Take 17 g by mouth daily as needed for mild constipation.   pregabalin 150 MG capsule Commonly known as:  LYRICA Take 1 capsule (150 mg total) by mouth 2 (two) times daily.   TRUE METRIX METER Devi 1 each by Does not apply route 3 (three) times daily before meals.       Allergies  Allergen Reactions  . Anesthetics, Amide Nausea And Vomiting  . Penicillins Diarrhea and Nausea And Vomiting    Has patient had a PCN reaction causing immediate rash, facial/tongue/throat swelling, SOB or lightheadedness with hypotension: No Has patient had a PCN reaction causing severe rash involving mucus membranes or skin necrosis: No Has patient had a PCN reaction that required hospitalization No Has patient had a PCN reaction occurring within the last 10 years: Yes  If all of the above answers are "NO", then may proceed with Cephalosporin use.   . Buprenorphine Hcl Rash  . Encainide Nausea And Vomiting    Consultations:  none   Procedures/Studies: Dg Chest Portable 1 View  Result Date: 03/22/2016 CLINICAL DATA:  Central line placement.  Initial encounter. EXAM: PORTABLE CHEST 1 VIEW COMPARISON:  Chest radiograph from 01/19/2016 FINDINGS: No central line is seen. The lungs are well-aerated and clear. There is no evidence of focal opacification, pleural effusion or pneumothorax. Bilateral nipple shadows are noted. The cardiomediastinal silhouette is within normal limits. No acute osseous abnormalities are seen. IMPRESSION: No central line seen.  No acute cardiopulmonary process identified. Electronically Signed   By: Garald Balding M.D.   On: 03/22/2016 23:56       Subjective: No new complaints.   Discharge Exam: Vitals:   04/08/16 2120 04/09/16 0455  BP: 127/69 137/76  Pulse: 77 74  Resp:  16 16  Temp: 97.8 F (36.6 C) 97.8 F (36.6 C)   Vitals:   04/08/16 1513 04/08/16 1805 04/08/16 2120 04/09/16 0455  BP: (!) 146/76 (!) 150/63 127/69 137/76  Pulse: 86 78 77 74  Resp: 16 16 16 16   Temp: 98 F (36.7 C) 98.2 F (36.8 C) 97.8 F (36.6 C) 97.8 F (36.6 C)  TempSrc: Oral Oral Oral Oral  SpO2: 100% 100% 99% 97%  Weight:      Height:        General: Pt is alert, awake, not in acute distress Cardiovascular: RRR, S1/S2 +, no rubs, no gallops Respiratory: CTA bilaterally, no wheezing, no rhonchi Abdominal: Soft, NT, ND, bowel sounds + Extremities: no edema, no cyanosis    The results of significant diagnostics from this hospitalization (including imaging, microbiology, ancillary and laboratory) are listed below for reference.     Microbiology: No results found for this or any previous visit (from the past 240 hour(s)).   Labs: BNP (last 3 results)  No results for input(s): BNP in the last 8760 hours. Basic Metabolic Panel:  Recent Labs Lab 04/06/16 1000 04/06/16 1021 04/06/16 1706 04/07/16 0355 04/08/16 0344  NA 132* 131* 141 137 136  K 4.9 4.9 4.7 4.2 4.4  CL 89* 93* 113* 110 109  CO2 19*  --  20* 21* 20*  GLUCOSE 692* 658* 223* 101* 213*  BUN 22* 23* 20 18 12   CREATININE 1.41* 1.10* 1.08* 1.21* 1.06*  CALCIUM 9.9  --  8.4* 8.3* 8.7*   Liver Function Tests:  Recent Labs Lab 04/06/16 1000  AST 31  ALT 24  ALKPHOS 103  BILITOT 2.0*  PROT 8.7*  ALBUMIN 4.6   No results for input(s): LIPASE, AMYLASE in the last 168 hours. No results for input(s): AMMONIA in the last 168 hours. CBC:  Recent Labs Lab 04/06/16 1000 04/06/16 1021 04/07/16 0355 04/08/16 0344  WBC 15.1*  --  7.2 4.4  NEUTROABS 13.4*  --   --   --   HGB 12.2 15.3* 8.8* 9.0*  HCT 40.1 45.0 28.7* 29.3*  MCV 77.6*  --  76.5* 76.5*  PLT 504*  --  392 PLATELET CLUMPS NOTED ON SMEAR, UNABLE TO ESTIMATE   Cardiac Enzymes: No results for input(s): CKTOTAL, CKMB, CKMBINDEX,  TROPONINI in the last 168 hours. BNP: Invalid input(s): POCBNP CBG:  Recent Labs Lab 04/08/16 0749 04/08/16 1230 04/08/16 1745 04/08/16 2122 04/09/16 0804  GLUCAP 155* 162* 156* 151* 137*   D-Dimer No results for input(s): DDIMER in the last 72 hours. Hgb A1c No results for input(s): HGBA1C in the last 72 hours. Lipid Profile No results for input(s): CHOL, HDL, LDLCALC, TRIG, CHOLHDL, LDLDIRECT in the last 72 hours. Thyroid function studies No results for input(s): TSH, T4TOTAL, T3FREE, THYROIDAB in the last 72 hours.  Invalid input(s): FREET3 Anemia work up No results for input(s): VITAMINB12, FOLATE, FERRITIN, TIBC, IRON, RETICCTPCT in the last 72 hours. Urinalysis    Component Value Date/Time   COLORURINE YELLOW 04/06/2016 1600   APPEARANCEUR CLEAR 04/06/2016 1600   LABSPEC 1.022 04/06/2016 1600   PHURINE 5.5 04/06/2016 1600   GLUCOSEU >1000 (A) 04/06/2016 1600   HGBUR NEGATIVE 04/06/2016 1600   BILIRUBINUR NEGATIVE 04/06/2016 1600   BILIRUBINUR NEGATIVE 08/04/2012 1005   KETONESUR >80 (A) 04/06/2016 1600   PROTEINUR NEGATIVE 04/06/2016 1600   UROBILINOGEN 0.2 04/27/2015 1608   NITRITE NEGATIVE 04/06/2016 1600   LEUKOCYTESUR NEGATIVE 04/06/2016 1600   Sepsis Labs Invalid input(s): PROCALCITONIN,  WBC,  LACTICIDVEN Microbiology No results found for this or any previous visit (from the past 240 hour(s)).   Time coordinating discharge: Over 30 minutes  SIGNED:   Hosie Poisson, MD  [facility] 04/09/2016, 9:25 AM Pager   If 7PM-7AM, please contact night-coverage [email or web address] Password [email or web address]

## 2016-04-09 NOTE — Telephone Encounter (Signed)
Addendum to prior call with the patient. She said that her cell phone is currently turned off and her son will need to have it turned on when she gets home.  She is not sure when that will be and she said that she will call the [facility] tomorrow to confirm the cab pick up time if she is not able to be reached today

## 2016-04-09 NOTE — Telephone Encounter (Signed)
Transitional Care Clinic Care Coordination Note:  Admit date:  04/06/16 Discharge date:  04/09/16 Discharge Disposition: home  Patient contact: [phone] ( cell) Emergency contact(s):  Kasandra Knudsen ( son) # [phone]  This Case Manager reviewed patient's EMR and determined patient would benefit from post-discharge medical management and chronic care management services through the [facility]. Patient has a history of DM - type 1, depression, anxiety, gastroparesis, HTN was admitted with DKA. She has 8 hospital admissions and 7 ED visits in the past 6 months and has been known to the [facility]. This Case Manager spoke  with patient by phone prior to her discharge to discuss the services and medical management that can be provided at the [facility]. Patient verbalized understanding and agreed to receive post-discharge care at the [facility].   Patient scheduled for Transitional Care appointment on 04/10/16 @ 1400.  Clinic information and appointment time provided to patient. Appointment information also placed on AVS.  Assessment:       Home Environment: lives with her sister ;but her sister works long hours. Private home       Support System: sister, son       Level of functioning:independent       Home DME:  glucometer       Home care services: (services arranged prior to discharge or new services after discharge)  - none at present time       Transportation: her son is able to provide transportation at times but she will need transportation to her TCC appointment tomorrow - 04/10/16.  Jacklynn Lewis, [facility] scheduler notified of the need for a cab tomorrow. Her address was confirmed        Food/Nutrition: (ability to afford, access, use of any community resources - Inquired if she has applied for food stamps and she stated that she had not. Provided her with the address for DSS and encouraged her to apply. She did not report any difficulties obtaining food  at this time        Medications: (ability to afford, access, compliance, Pharmacy used) - uses [facility]. She did not report any difficulty obtaining/affording her medications.         Identified Barriers: no insurance, possible difficulties with transportation to her appointments.         PCP (Name, office location, phone number): Dr Jarold Song  Patient Education: Reviewed the importance of completing the [facility]. She said that she has collected the documentation that she needs to complete the application - 3 months of bank statements and a notarized letter from her sister noting that her sister provides shelter for her. Instructed her to bring the documentation with her to her appointment tomorrow. Diane Luciana Axe, [facility] Financial Counselor notified that the patient will be bringing the [place] documentation with her to the clinic tomorrow.  The patient stated that she has been denied disability ( she was denied in September for July application) and she may need to consult with an attorney to dispute the denial. She also stated that she has not applied for medicaid but will consider applying when she goes to DSS to apply for food stamps.             Arranged services:        Services communicated to Marney Doctor, RN CM

## 2016-04-09 NOTE — Evaluation (Signed)
Physical Therapy Evaluation Patient Details Name: [name] MRN: [id number] DOB: 29-Dec-1965 Today's Date: 04/09/2016   History of Present Illness  50 yr old female admitted with DKA. Hx of DM, DKA, gastroparesis  Clinical Impression  On eval, pt was Min guard assist for mobility. She walked ~200 feet in hallway. Mildly unsteady at times but no overt LOB. Pt reports she feels generally weak. Instructed pt to slowly start to mobilize more at home as tolerated. Recommend daily ambulation in hallway with nursing supervision. Do not anticipate any follow up PT needs.     Follow Up Recommendations No PT follow up;Supervision for mobility/OOB    Equipment Recommendations  None recommended by PT    Recommendations for Other Services       Precautions / Restrictions Precautions Precautions: Fall Restrictions Weight Bearing Restrictions: No      Mobility  Bed Mobility Overal bed mobility: Modified Independent                Transfers Overall transfer level: Modified independent                  Ambulation/Gait Ambulation/Gait assistance: Min guard Ambulation Distance (Feet): 200 Feet Assistive device: None (hallway handrail) Gait Pattern/deviations: Step-through pattern;Decreased stride length     General Gait Details: slow gait speed. Pt intermittently held on to hallway handrail. No overt LOB but she is mildly unsteady.  Stairs            Wheelchair Mobility    Modified Rankin (Stroke Patients Only)       Balance Overall balance assessment: Needs assistance           Standing balance-Leahy Scale: Good                               Pertinent Vitals/Pain Pain Assessment: No/denies pain    Home Living Family/patient expects to be discharged to:: Private residence Living Arrangements: Other relatives Available Help at Discharge: Family Type of Home: House Home Access: Stairs to enter Entrance Stairs-Rails:  Right Entrance Stairs-Number of Steps: 4 Home Layout: One level Home Equipment: None      Prior Function Level of Independence: Independent               Hand Dominance        Extremity/Trunk Assessment   Upper Extremity Assessment: Overall WFL for tasks assessed           Lower Extremity Assessment: Generalized weakness      Cervical / Trunk Assessment: Normal  Communication   Communication: No difficulties  Cognition Arousal/Alertness: Awake/alert Behavior During Therapy: WFL for tasks assessed/performed Overall Cognitive Status: Within Functional Limits for tasks assessed                      General Comments      Exercises     Assessment/Plan    PT Assessment Patient needs continued PT services  PT Problem List Decreased mobility;Decreased strength          PT Treatment Interventions Gait training;Functional mobility training;Therapeutic activities;Therapeutic exercise;Patient/family education    PT Goals (Current goals can be found in the Care Plan section)  Acute Rehab PT Goals Patient Stated Goal: home later today PT Goal Formulation: With patient Time For Goal Achievement: 04/23/16 Potential to Achieve Goals: Good    Frequency Min 3X/week   Barriers to discharge        Co-evaluation  End of Session Equipment Utilized During Treatment: Gait belt Activity Tolerance: Patient tolerated treatment well Patient left: in bed;with call bell/phone within reach           Time: 1035-1048 PT Time Calculation (min) (ACUTE ONLY): 13 min   Charges:   PT Evaluation $PT Eval Low Complexity: 1 Procedure     PT G Codes:        [name], MPT Pager: [phone]

## 2016-04-09 NOTE — Progress Notes (Signed)
Discharge instructions reviewed with patient. Patient verbalized understanding. Patient is waiting for son to pick her up and will be discharged via private vehicle with son.

## 2016-04-10 ENCOUNTER — Inpatient Hospital Stay: Payer: Self-pay | Admitting: Family Medicine

## 2016-04-10 ENCOUNTER — Other Ambulatory Visit: Payer: Self-pay | Admitting: Family Medicine

## 2016-04-10 ENCOUNTER — Telehealth: Payer: Self-pay

## 2016-04-10 ENCOUNTER — Encounter: Payer: Self-pay | Admitting: Licensed Clinical Social Worker

## 2016-04-10 ENCOUNTER — Ambulatory Visit: Payer: Self-pay | Attending: Family Medicine | Admitting: Family Medicine

## 2016-04-10 ENCOUNTER — Encounter: Payer: Self-pay | Admitting: Family Medicine

## 2016-04-10 VITALS — BP 168/82 | HR 89 | Temp 98.3°F | Ht 62.0 in | Wt 148.8 lb

## 2016-04-10 DIAGNOSIS — Z9119 Patient's noncompliance with other medical treatment and regimen: Secondary | ICD-10-CM | POA: Insufficient documentation

## 2016-04-10 DIAGNOSIS — F419 Anxiety disorder, unspecified: Secondary | ICD-10-CM | POA: Insufficient documentation

## 2016-04-10 DIAGNOSIS — F32A Depression, unspecified: Secondary | ICD-10-CM

## 2016-04-10 DIAGNOSIS — K219 Gastro-esophageal reflux disease without esophagitis: Secondary | ICD-10-CM | POA: Insufficient documentation

## 2016-04-10 DIAGNOSIS — F13239 Sedative, hypnotic or anxiolytic dependence with withdrawal, unspecified: Secondary | ICD-10-CM

## 2016-04-10 DIAGNOSIS — F329 Major depressive disorder, single episode, unspecified: Secondary | ICD-10-CM | POA: Insufficient documentation

## 2016-04-10 DIAGNOSIS — Z880 Allergy status to penicillin: Secondary | ICD-10-CM | POA: Insufficient documentation

## 2016-04-10 DIAGNOSIS — E1010 Type 1 diabetes mellitus with ketoacidosis without coma: Secondary | ICD-10-CM | POA: Insufficient documentation

## 2016-04-10 DIAGNOSIS — F418 Other specified anxiety disorders: Secondary | ICD-10-CM

## 2016-04-10 DIAGNOSIS — E1143 Type 2 diabetes mellitus with diabetic autonomic (poly)neuropathy: Secondary | ICD-10-CM

## 2016-04-10 DIAGNOSIS — Z888 Allergy status to other drugs, medicaments and biological substances status: Secondary | ICD-10-CM | POA: Insufficient documentation

## 2016-04-10 DIAGNOSIS — K3184 Gastroparesis: Secondary | ICD-10-CM | POA: Insufficient documentation

## 2016-04-10 DIAGNOSIS — E1043 Type 1 diabetes mellitus with diabetic autonomic (poly)neuropathy: Secondary | ICD-10-CM | POA: Insufficient documentation

## 2016-04-10 DIAGNOSIS — E785 Hyperlipidemia, unspecified: Secondary | ICD-10-CM | POA: Insufficient documentation

## 2016-04-10 DIAGNOSIS — I10 Essential (primary) hypertension: Secondary | ICD-10-CM | POA: Insufficient documentation

## 2016-04-10 DIAGNOSIS — F13939 Sedative, hypnotic or anxiolytic use, unspecified with withdrawal, unspecified: Secondary | ICD-10-CM

## 2016-04-10 DIAGNOSIS — Z794 Long term (current) use of insulin: Secondary | ICD-10-CM | POA: Insufficient documentation

## 2016-04-10 DIAGNOSIS — D649 Anemia, unspecified: Secondary | ICD-10-CM | POA: Insufficient documentation

## 2016-04-10 LAB — GLUCOSE, POCT (MANUAL RESULT ENTRY): POC Glucose: 250 mg/dl — AB (ref 70–99)

## 2016-04-10 MED ORDER — INSULIN DETEMIR 100 UNIT/ML ~~LOC~~ SOLN: 25.0000 [IU] | Freq: Every day | SUBCUTANEOUS | 5 refills | Status: DC

## 2016-04-10 MED ORDER — FLUOXETINE HCL 20 MG PO TABS: 20.0000 mg | ORAL_TABLET | Freq: Every day | ORAL | 3 refills | Status: DC

## 2016-04-10 MED ORDER — ONDANSETRON 4 MG PO TBDP: 4.0000 mg | ORAL_TABLET | Freq: Three times a day (TID) | ORAL | 2 refills | Status: DC | PRN

## 2016-04-10 MED ORDER — BUSPIRONE HCL 10 MG PO TABS: 10.0000 mg | ORAL_TABLET | Freq: Two times a day (BID) | ORAL | 2 refills | Status: DC

## 2016-04-10 MED ORDER — CLONIDINE HCL 0.1 MG PO TABS: 0.1000 mg | ORAL_TABLET | Freq: Every day | ORAL | 0 refills | Status: DC

## 2016-04-10 MED FILL — ?FLUOXETINE HCL 20MG TABLET: 20 | 30 days supply | Qty: 30 | Fill #0

## 2016-04-10 MED FILL — busPIRone HCL 10 MG TABS: 10 | 30 days supply | Qty: 60 | Fill #0

## 2016-04-10 MED FILL — cloNIDine HCL 0.1 MG TABS: 0.1 | 5 days supply | Qty: 5 | Fill #0

## 2016-04-10 MED FILL — ONDANSETRON ODT 4 MG TABLET: 4 | 20 days supply | Qty: 60 | Fill #0

## 2016-04-10 NOTE — BH Specialist Note (Signed)
Session Start time: 3:05 pm   End Time: 3:35 pm Total Time:  30 minutes Type of Service: [place] Interpreter: No.   Interpreter Name & Language: N/A # [facility] Visits July 2017-June 2018: 1st   SUBJECTIVE: [name] is a 50 y.o. female  Pt. was referred by Dr. Jarold [name] for:  anxiety. Pt. reports the following symptoms/concerns: tremors, difficulty breathing Duration of problem:  1 year Severity: moderate Previous treatment: Pt has taken prescribed medication to address depression and anxiety   OBJECTIVE: Mood: Anxious & Affect: Blunt Risk of harm to self or others: Pt denied SI/HI Assessments administered: PHQ-9; GAD-7  LIFE CONTEXT:  Family & Social: Pt resides with sister and has adult son who resides nearby Higher education careers adviser Work: Pt is unemployed and receives financial support from sister Self-Care: Pt watches television to help cope with anxiety Life changes: Pt has had many hospitalizations due to diabetic gastroparesis What is important to pt/family (values): Family; Health   GOALS ADDRESSED:  Decrease symptoms of anxiety  INTERVENTIONS: Motivational Interviewing and Supportive   ASSESSMENT:  Pt currently experiencing anxiety triggered by multiple hospitalizations in regards to medical health. LCSWA discussed healthy coping skills to address anxiety. Pt may benefit from psychoeducation, psychotherapy, and psychiatry medication management. LCSWA provided pt community resources for medication management.      PLAN: 1. F/U with behavioral health clinician: Pt was encouraged to contact [place] if symptoms worsen or fail to improve to schedule behavioral appointments at [facility]. 2. Behavioral Health meds: Buspar 3. Behavioral recommendations: Pt is encouraged to schedule follow up appointment with LCSWA 4. Referral: Brief Counseling/Psychotherapy, [name], Psychoeducation and Supportive Counseling 5. From scale of 1-10, how likely are you to follow  plan: [name], MSW, [place] Worker 04/10/16 6:06 pm  Warmhandoff:   Warm Hand Off Completed.

## 2016-04-10 NOTE — Progress Notes (Signed)
Pt reports having frequent panic attacks and requests new script for Ativan (originally prescribed in hospital) Needs erythromycin filled - pt reports she never got it filled Pt reports that her Lyrica was not delivered to her house per [facility] pharmacy Pt requests refills for Zofran, Lyrica, Levemir

## 2016-04-10 NOTE — Patient Instructions (Signed)
Gastroparesis °Gastroparesis, also called delayed gastric emptying, is a condition in which food takes longer than normal to empty from the stomach. The condition is usually long-lasting (chronic). °CAUSES °This condition may be caused by: °· An endocrine disorder, such as hypothyroidism or diabetes. Diabetes is the most common cause of this condition. °· A nervous system disease, such as Parkinson disease or multiple sclerosis. °· Cancer, infection, or surgery of the stomach or vagus nerve. °· A connective tissue disorder, such as scleroderma. °· Certain medicines. °In most cases, the cause is not known. °RISK FACTORS °This condition is more likely to develop in: °· People with certain disorders, including endocrine disorders, eating disorders, amyloidosis, and scleroderma. °· People with certain diseases, including Parkinson disease or multiple sclerosis. °· People with cancer or infection of the stomach or vagus nerve. °· People who have had surgery on the stomach or vagus nerve. °· People who take certain medicines. °· Women. °SYMPTOMS °Symptoms of this condition include: °· An early feeling of fullness when eating. °· Nausea. °· Weight loss. °· Vomiting. °· Heartburn. °· Abdominal bloating. °· Inconsistent blood glucose levels. °· Lack of appetite. °· Acid from the stomach coming up into the esophagus (gastroesophageal reflux). °· Spasms of the stomach. °Symptoms may come and go. °DIAGNOSIS °This condition is diagnosed with tests, such as: °· Tests that check how long it takes food to move through the stomach and intestines. These tests include: °¨ Upper gastrointestinal (GI) series. In this test, X-rays of the intestines are taken after you drink a liquid. The liquid makes the intestines show up better on the X-rays. °¨ Gastric emptying scintigraphy. In this test, scans are taken after you eat food that contains a small amount of radioactive material. °¨ Wireless capsule GI monitoring system. This test  involves swallowing a capsule that records information about movement through the stomach. °· Gastric manometry. This test measures electrical and muscular activity in the stomach. It is done with a thin tube that is passed down the throat and into the stomach. °· Endoscopy. This test checks for abnormalities in the lining of the stomach. It is done with a long, thin tube that is passed down the throat and into the stomach. °· An ultrasound. This test can help rule out gallbladder disease or pancreatitis as a cause of your symptoms. It uses sound waves to take pictures of the inside of your body. °TREATMENT °There is no cure for gastroparesis. This condition may be managed with: °· Treatment of the underlying condition causing the gastroparesis. °· Lifestyle changes, including exercise and dietary changes. Dietary changes can include: °¨ Changes in what and when you eat. °¨ Eating smaller meals more often. °¨ Eating low-fat foods. °¨ Eating low-fiber forms of high-fiber foods, such as cooked vegetables instead of raw vegetables. °¨ Having liquid foods in place of solid foods. Liquid foods are easier to digest. °· Medicines. These may be given to control nausea and vomiting and to stimulate stomach muscles. °· Getting food through a feeding tube. This may be done in severe cases. °· A gastric neurostimulator. This is a device that is inserted into the body with surgery. It helps improve stomach emptying and control nausea and vomiting. °HOME CARE INSTRUCTIONS °· Follow your health care provider's instructions about exercise and diet. °· Take medicines only as directed by your health care provider. °SEEK MEDICAL CARE IF: °· Your symptoms do not improve with treatment. °· You have new symptoms. °SEEK IMMEDIATE MEDICAL CARE IF: °· You have   severe abdominal pain that does not improve with treatment. °· You have nausea that does not go away. °· You cannot keep fluids down. °  °This information is not intended to replace  advice given to you by your health care provider. Make sure you discuss any questions you have with your health care provider. °  °Document Released: 06/11/2005 Document Revised: 10/26/2014 Document Reviewed: 06/07/2014 °Elsevier Interactive Patient Education ©2016 [employer] ° °

## 2016-04-10 NOTE — Progress Notes (Signed)
Subjective:  Patient ID: [name], female    DOB: [birth year]  Age: 50 y.o. MRN: [id number]  CC: Hospitalization Follow-up; Diabetic Ketoacidosis; and GI Problem (gastroparesis)   HPI [name] is a 50 year old female with a history of uncontrolled type 1 diabetes mellitus (A1c 10.3), diabetic neuropathy, diabetic gastroparesis, hypertension, anemia, anxiety and depression, multiple hospitalizations for diabetic gastroparesis with recent hospitalization for diabetic ketoacidosis.  She received IV hydration, antiemetics with improvement in symptoms. She had been previously prescribed erythromycin which she never picked up and so she was discharged on her regular medications.  On walking into the room she informs me "I need ativan". Review of her chart indicates on 03/30/16 she was prescribed 6 tablets of Ativan 1 mg 3 times daily as needed; she has tremors and tells me her BuSpar is not working and only Ativan would help.States last dose of Ativan was yesterday. She also would like a refill of some other medication; informs me she never received her Lyrica as she was informed by the pharmacy that this would be delivered to her home however she is yet to receive her Lyrica.  Despite being reminded at previous appointments she is yet to complete her [employer] application and also does not have any medical coverage and has been unable to see a GI specialist; received Marinol in the past from gastroenterology at [facility].   Past Medical History:  Diagnosis Date  . Depression   . Diabetes mellitus without complication ([place])   . Essential hypertension   . Gastroparesis   . GERD (gastroesophageal reflux disease)   . HLD (hyperlipidemia)    Past Surgical History:  Procedure Laterality Date  . COLONOSCOPY  09/27/2014   at [facility]  . ESOPHAGOGASTRODUODENOSCOPY  09/27/2014   at [facility], Dr Rolan Lipa. biospy neg for celiac, neg for H pylori.   . EYE SURGERY     . gailstones    . IR GENERIC HISTORICAL  01/24/2016   IR FLUORO GUIDE CV LINE RIGHT 01/24/2016 Darrell K Allred, PA-C [facility]  . IR GENERIC HISTORICAL  01/24/2016   IR US GUIDE VASC ACCESS RIGHT 01/24/2016 Darrell K Allred, PA-C [facility]  . POSTERIOR VITRECTOMY AND MEMBRANE PEEL-LEFT EYE  09/28/2002  . POSTERIOR VITRECTOMY AND MEMBRANE PEEL-RIGHT EYE  03/16/2002  . RETINAL DETACHMENT SURGERY      Allergies  Allergen Reactions  . Anesthetics, Amide Nausea And Vomiting  . Penicillins Diarrhea and Nausea And Vomiting    Has patient had a PCN reaction causing immediate rash, facial/tongue/throat swelling, SOB or lightheadedness with hypotension: No Has patient had a PCN reaction causing severe rash involving mucus membranes or skin necrosis: No Has patient had a PCN reaction that required hospitalization No Has patient had a PCN reaction occurring within the last 10 years: Yes  If all of the above answers are "NO", then may proceed with Cephalosporin use.   . Buprenorphine Hcl Rash  . Encainide Nausea And Vomiting     Outpatient Medications Prior to Visit  Medication Sig Dispense Refill  . albuterol (PROVENTIL HFA;VENTOLIN HFA) 108 (90 Base) MCG/ACT inhaler Inhale 2 puffs into the lungs every 6 (six) hours as needed for wheezing or shortness of breath. 1 Inhaler 0  . Blood Glucose Monitoring Suppl (TRUE METRIX METER) DEVI 1 each by Does not apply route 3 (three) times daily before meals. 1 Device 0  . docusate sodium (COLACE) 250 MG capsule Take 1 capsule (250 mg total) by mouth 2 (  two) times daily. 60 capsule 0  . ferrous sulfate 325 (65 FE) MG tablet Take 1 tablet (325 mg total) by mouth daily with breakfast. 30 tablet 0  . furosemide (LASIX) 40 MG tablet Take 40 mg by mouth daily as needed for fluid.     Marland Kitchen glucose blood (TRUE METRIX BLOOD GLUCOSE TEST) test strip Use 3 times daily before meals 100 each 5  . insulin aspart (NOVOLOG) 100 UNIT/ML injection Inject 0-15 Units into the  skin 3 (three) times daily with meals. Per sliding scale (Patient taking differently: Inject 4-15 Units into the skin 3 (three) times daily with meals. Per sliding scale) 50 mL 3  . Insulin Syringe-Needle U-100 (BD INSULIN SYRINGE ULTRAFINE) 31G X 15/64" 0.5 ML MISC 1 each by Does not apply route 4 (four) times daily as needed. 120 each 5  . metoCLOPramide (REGLAN) 10 MG tablet Take 1 tablet (10 mg total) by mouth 3 (three) times daily before meals. 90 tablet 0  . polyethylene glycol (MIRALAX / GLYCOLAX) packet Take 17 g by mouth daily as needed for mild constipation. 100 each 0  . pregabalin (LYRICA) 150 MG capsule Take 1 capsule (150 mg total) by mouth 2 (two) times daily. 60 capsule 3  . busPIRone (BUSPAR) 5 MG tablet Take 1 tablet (5 mg total) by mouth 3 (three) times daily. 90 tablet 3  . insulin detemir (LEVEMIR) 100 UNIT/ML injection Inject 0.22 mLs (22 Units total) into the skin at bedtime. 10 mL 11  . LORazepam (ATIVAN) 1 MG tablet Take 1 tablet (1 mg total) by mouth 3 (three) times daily as needed for anxiety. 6 tablet 0  . ondansetron (ZOFRAN ODT) 4 MG disintegrating tablet Take 1 tablet (4 mg total) by mouth every 8 (eight) hours as needed for nausea or vomiting. 10 tablet 0  . erythromycin (E-MYCIN) 250 MG tablet Take 1 tablet (250 mg total) by mouth 3 (three) times daily with meals as needed. (Patient not taking: Reported on 04/10/2016) 90 tablet 0  . insulin aspart (NOVOLOG) 100 UNIT/ML injection Inject 4 Units into the skin 3 (three) times daily with meals. (Patient not taking: Reported on 04/10/2016) 10 mL 11   No facility-administered medications prior to visit.     ROS Review of Systems  Constitutional: Negative for activity change, appetite change and fatigue.  HENT: Negative for congestion, sinus pressure and sore throat.   Eyes: Negative for visual disturbance.  Respiratory: Negative for cough, chest tightness, shortness of breath and wheezing.   Cardiovascular: Negative  for chest pain and palpitations.  Gastrointestinal: Negative for abdominal distention, abdominal pain and constipation.  Endocrine: Negative for polydipsia.  Genitourinary: Negative for dysuria and frequency.  Musculoskeletal: Negative for arthralgias and back pain.  Skin: Negative for rash.  Neurological: Positive for tremors. Negative for light-headedness and numbness.  Hematological: Does not bruise/bleed easily.  Psychiatric/Behavioral: Negative for agitation and behavioral problems. The patient is nervous/anxious.     Objective:  BP 129/73 (BP Location: Right Arm, Patient Position: Sitting, Cuff Size: Large)   Pulse 89   Temp 98.3 F (36.8 C) (Oral)   Ht 5\' 2"  (1.575 m)   Wt 148 lb 12.8 oz (67.5 kg)   LMP 02/15/2016 (Approximate)   SpO2 99%   BMI 27.22 kg/m   BP/Weight 04/10/2016 04/09/2016 [id number]  Systolic BP [id number] -  Diastolic BP 73 56 -  Wt. (Lbs) 148.8 - 143  BMI 27.22 - 26.16      Physical Exam  Constitutional:  She is oriented to person, place, and time. She appears well-developed and well-nourished.  Neck: Normal range of motion. No JVD present.  Cardiovascular: Normal rate, normal heart sounds and intact distal pulses.   No murmur heard. Pulmonary/Chest: Effort normal and breath sounds normal. She has no wheezes. She has no rales. She exhibits no tenderness.  Abdominal: Soft. Bowel sounds are normal. She exhibits no distension and no mass. There is no tenderness.  Musculoskeletal: Normal range of motion.  Neurological: She is alert and oriented to person, place, and time.  Generalized tremors  Psychiatric: Her mood appears anxious.    Lab Results  Component Value Date   HGBA1C 10.3 (H) 03/19/2016     Assessment & Plan:   1. Diabetic ketoacidosis without coma associated with type 1 diabetes mellitus ([place]) Uncontrolled with A1c of 10.3 due to noncompliance Continue Levemir 25 units along with sliding scale Strongly advised to comply with  diabetic diet - Glucose (CBG) - insulin detemir (LEVEMIR) 100 UNIT/ML injection; Inject 0.25 mLs (25 Units total) into the skin at bedtime.  Dispense: 10 mL; Refill: 5  2. Diabetic gastroparesis ([place]) Previously followed by GI at [facility] and was on Marinol which worked well-had to discontinue due to lack of Risk analyst with pharmacist to review these previous prescription of erythromycin which the patient never picked up Eat small frequent portions - ondansetron (ZOFRAN ODT) 4 MG disintegrating tablet; Take 1 tablet (4 mg total) by mouth every 8 (eight) hours as needed for nausea or vomiting.  Dispense: 60 tablet; Refill: 2  3. Anxiety and depression Uncontrolled Increase dose of BuSpar Added SSRI to her regimen I have informed the patient and I will be unable to keep her on Ativan which she is requesting - busPIRone (BUSPAR) 10 MG tablet; Take 1 tablet (10 mg total) by mouth 2 (two) times daily.  Dispense: 60 tablet; Refill: 2 - FLUoxetine (PROZAC) 20 MG tablet; Take 1 tablet (20 mg total) by mouth daily.  Dispense: 30 tablet; Refill: 3  4. Withdrawal from sedative, hypnotic, or anxiolytic drug ([place]) Patient seemed to be able to control tremors and this is suspicious - could be exaggeration of anxiety versus withdrawal from Ativan (this is 11 days out from when she was prescribed six tabs by the ED, last dose yesterday). Clonidine 0.1 mg administered Blood pressure reassessed We'll send off both urine and serum drug screen test in the event that urine results are unreliable.   Meds ordered this encounter  Medications  . insulin detemir (LEVEMIR) 100 UNIT/ML injection    Sig: Inject 0.25 mLs (25 Units total) into the skin at bedtime.    Dispense:  10 mL    Refill:  5  . ondansetron (ZOFRAN ODT) 4 MG disintegrating tablet    Sig: Take 1 tablet (4 mg total) by mouth every 8 (eight) hours as needed for nausea or vomiting.    Dispense:  60 tablet    Refill:  2  . busPIRone (BUSPAR)  10 MG tablet    Sig: Take 1 tablet (10 mg total) by mouth 2 (two) times daily.    Dispense:  60 tablet    Refill:  2    Discontinue previous dose  . FLUoxetine (PROZAC) 20 MG tablet    Sig: Take 1 tablet (20 mg total) by mouth daily.    Dispense:  30 tablet    Refill:  3    Follow-up: 2 weeks follow up on Diabetes  [name] MD

## 2016-04-10 NOTE — Telephone Encounter (Signed)
Met with the patient when she was in the clinic for her appointment with Dr Jarold Song today. She had the documentation needed to complete her [employer] application and was prepared to submit it to Johnnette Barrios, [facility].

## 2016-04-11 ENCOUNTER — Emergency Department (HOSPITAL_COMMUNITY)
Admission: EM | Admit: 2016-04-11 | Discharge: 2016-04-12 | Disposition: A | Discharge status: Home / Self Care | Payer: Self-pay | Attending: Emergency Medicine | Admitting: Emergency Medicine

## 2016-04-11 ENCOUNTER — Telehealth: Payer: Self-pay

## 2016-04-11 ENCOUNTER — Encounter (HOSPITAL_COMMUNITY): Payer: Self-pay | Admitting: Emergency Medicine

## 2016-04-11 DIAGNOSIS — E1040 Type 1 diabetes mellitus with diabetic neuropathy, unspecified: Secondary | ICD-10-CM | POA: Insufficient documentation

## 2016-04-11 DIAGNOSIS — E1043 Type 1 diabetes mellitus with diabetic autonomic (poly)neuropathy: Secondary | ICD-10-CM | POA: Insufficient documentation

## 2016-04-11 DIAGNOSIS — R112 Nausea with vomiting, unspecified: Secondary | ICD-10-CM | POA: Insufficient documentation

## 2016-04-11 DIAGNOSIS — I10 Essential (primary) hypertension: Secondary | ICD-10-CM | POA: Insufficient documentation

## 2016-04-11 LAB — COMPREHENSIVE METABOLIC PANEL
ALBUMIN: 4 g/dL (ref 3.5–5.0)
ALK PHOS: 83 U/L (ref 38–126)
ALT: 54 U/L (ref 14–54)
AST: 68 U/L — AB (ref 15–41)
Anion gap: 10 (ref 5–15)
BILIRUBIN TOTAL: 1.3 mg/dL — AB (ref 0.3–1.2)
BUN: 13 mg/dL (ref 6–20)
CALCIUM: 9.4 mg/dL (ref 8.9–10.3)
CO2: 26 mmol/L (ref 22–32)
CREATININE: 1.11 mg/dL — AB (ref 0.44–1.00)
Chloride: 100 mmol/L — ABNORMAL LOW (ref 101–111)
GFR calc Af Amer: >60 mL/min (ref >60)
GFR, EST NON AFRICAN AMERICAN: 58 mL/min — AB (ref >60)
GLUCOSE: 316 mg/dL — AB (ref 65–99)
Potassium: 4.9 mmol/L (ref 3.5–5.1)
Sodium: 136 mmol/L (ref 135–145)
TOTAL PROTEIN: 8 g/dL (ref 6.5–8.1)

## 2016-04-11 LAB — URINE MICROSCOPIC-ADD ON

## 2016-04-11 LAB — URINALYSIS, ROUTINE W REFLEX MICROSCOPIC
BILIRUBIN URINE: NEGATIVE
Glucose, UA: NEGATIVE mg/dL
Hgb urine dipstick: NEGATIVE
KETONES UR: NEGATIVE mg/dL
Leukocytes, UA: NEGATIVE
NITRITE: NEGATIVE
Protein, ur: 30 mg/dL — AB
Specific Gravity, Urine: 1.015 (ref 1.005–1.030)
pH: 7.5 (ref 5.0–8.0)

## 2016-04-11 LAB — CBG MONITORING, ED: GLUCOSE-CAPILLARY: 124 mg/dL — AB (ref 65–99)

## 2016-04-11 LAB — CBC
HEMATOCRIT: 34.8 % — AB (ref 36.0–46.0)
Hemoglobin: 11 g/dL — ABNORMAL LOW (ref 12.0–15.0)
MCH: 23.8 pg — ABNORMAL LOW (ref 26.0–34.0)
MCHC: 31.6 g/dL (ref 30.0–36.0)
MCV: 75.3 fL — ABNORMAL LOW (ref 78.0–100.0)
PLATELETS: 550 10*3/uL — AB (ref 150–400)
RBC: 4.62 MIL/uL (ref 3.87–5.11)
RDW: 22.1 % — AB (ref 11.5–15.5)
WBC: 5.4 10*3/uL (ref 4.0–10.5)

## 2016-04-11 LAB — LIPASE, BLOOD: Lipase: 46 U/L (ref 11–51)

## 2016-04-11 LAB — POC URINE PREG, ED: PREG TEST UR: NEGATIVE

## 2016-04-11 MED ORDER — SODIUM CHLORIDE 0.9 % IV BOLUS (SEPSIS)
1000.0000 mL | Freq: Once | INTRAVENOUS | Status: AC
  Administered 2016-04-11: 1000 mL via INTRAVENOUS

## 2016-04-11 MED ORDER — ONDANSETRON 8 MG PO TBDP
8.0000 mg | ORAL_TABLET | Freq: Once | ORAL | Status: AC
  Administered 2016-04-11: 8 mg via ORAL
  Filled 2016-04-11: qty 1

## 2016-04-11 MED ORDER — DIPHENHYDRAMINE HCL 50 MG/ML IJ SOLN: 12.5000 mg | Freq: Once | INTRAMUSCULAR | Status: DC

## 2016-04-11 MED ORDER — METOCLOPRAMIDE HCL 5 MG/ML IJ SOLN
10.0000 mg | Freq: Once | INTRAMUSCULAR | Status: AC
  Administered 2016-04-11: 10 mg via INTRAVENOUS
  Filled 2016-04-11: qty 2

## 2016-04-11 MED ORDER — HALOPERIDOL LACTATE 5 MG/ML IJ SOLN
5.0000 mg | Freq: Once | INTRAMUSCULAR | Status: AC
  Administered 2016-04-12: 5 mg via INTRAVENOUS
  Filled 2016-04-11: qty 1

## 2016-04-11 NOTE — Telephone Encounter (Signed)
At request of Dr Jarold Song, this CM spoke to [name], [name], to clarify the status of the lyrica prescription that was written 9/07/04/15. Gary Fleet stated that the prescription was faxed to the manufacturer today, 04/11/18 and it will take 24-48 hours to process the request and then 7-10 business days to ship to the patient's home.    Update provided to Dr Jarold Song.  She stated that gabapentin could be prescribed in lieu of the lyrica at this time if the patient is agreeable.   Call placed to the patient # [phone] to inform her of the option for gabapentin and a HIPAA compliant voicemail message was left requesting a call back to # [phone] or [phone].

## 2016-04-11 NOTE — ED Triage Notes (Addendum)
Pt from home with EMS with complaints of chronic nausea secondary to gasroparesis. Pt reports pain began around 1100 and pt last took zofran at noon. Pt reports that ativan and zofran are normally effective for her nausea. Pt reports normally being hypoglycemic but is not at the time or arrival. EMS got a CBG of 172. Pt has had about 8 episodes of emesis today. Pt denies diarrhea

## 2016-04-11 NOTE — Progress Notes (Signed)
This patient noted to have had 8 ED visits with 8 admissions within the last six months.  This patient is being seen at the [facility] by Dr. Jarold Song.  She is also enrolled at the TCC (tansitional care clinic) at the [facility].  Patient has an appointment scheduled on 04/24/2016 at 11am at [facility] for diabetes.  Per chart review, patient has enrolled for an orange card.

## 2016-04-11 NOTE — ED Notes (Signed)
Patient request lab draw with IV start.

## 2016-04-11 NOTE — ED Provider Notes (Signed)
[place] DEPT Provider Note   CSN: [id number] Arrival date & time: 04/11/16  1521     History   Chief Complaint Chief Complaint  Patient presents with  . Nausea  . Emesis    HPI [name] is a 50 y.o. female.  The history is provided by the patient.  Emesis   This is a recurrent problem. The current episode started 6 to 12 hours ago. The problem occurs 5 to 10 times per day. There has been no fever. Associated symptoms include abdominal pain. Pertinent negatives include no chills, no cough, no fever, no headaches and no URI.   H/o gastroparesis. Same as prior episodes.  Past Medical History:  Diagnosis Date  . Depression   . Diabetes mellitus without complication ([place])   . Essential hypertension   . Gastroparesis   . GERD (gastroesophageal reflux disease)   . HLD (hyperlipidemia)     Patient Active Problem List   Diagnosis Date Noted  . Type 1 diabetes mellitus with ketoacidosis without coma ([place])   . DKA (diabetic ketoacidoses) ([place]) 03/18/2016  . Noncompliance with treatment plan 03/13/2016  . Essential hypertension 01/24/2016  . Leukocytosis 01/24/2016  . Abnormal ECG 11/27/2015  . HLD (hyperlipidemia)   . GERD (gastroesophageal reflux disease)   . Metabolic acidosis, normal anion gap (NAG)   . AKI (acute kidney injury) ([place])   . Anemia, iron deficiency   . DKA, type 1, not at goal [facility])   . Hyponatremia 09/02/2015  . Vitamin B12 deficiency 08/16/2015  . Lactic acidosis 07/29/2015  . Acute pyelonephritis 07/29/2015  . Pyelonephritis   . Hypothermia   . Diabetic gastroparesis ([place])   . Diabetic neuropathy, type I diabetes mellitus ([place]) 05/18/2015  . Anemia 05/18/2015  . Anxiety and depression 05/18/2015  . Nausea & vomiting   . Diabetic ketoacidosis without coma associated with type 1 diabetes mellitus ([place])   . High anion gap metabolic acidosis [id number]    Past Surgical History:  Procedure Laterality Date  . COLONOSCOPY  09/27/2014   at [facility]  . ESOPHAGOGASTRODUODENOSCOPY  09/27/2014   at [facility], Dr Rolan Lipa. biospy neg for celiac, neg for H pylori.   . EYE SURGERY    . gailstones    . IR GENERIC HISTORICAL  01/24/2016   IR FLUORO GUIDE CV LINE RIGHT 01/24/2016 Darrell K Allred, PA-C [facility]  . IR GENERIC HISTORICAL  01/24/2016   IR US GUIDE VASC ACCESS RIGHT 01/24/2016 Darrell K Allred, PA-C [facility]  . POSTERIOR VITRECTOMY AND MEMBRANE PEEL-LEFT EYE  09/28/2002  . POSTERIOR VITRECTOMY AND MEMBRANE PEEL-RIGHT EYE  03/16/2002  . RETINAL DETACHMENT SURGERY      OB History    No data available       Home Medications    Prior to Admission medications   Medication Sig Start Date End Date Taking? Authorizing Provider  albuterol (PROVENTIL HFA;VENTOLIN HFA) 108 (90 Base) MCG/ACT inhaler Inhale 2 puffs into the lungs every 6 (six) hours as needed for wheezing or shortness of breath. 10/20/15  Yes [name], MD  Blood Glucose Monitoring Suppl (TRUE METRIX METER) DEVI 1 each by Does not apply route 3 (three) times daily before meals. 07/19/15  Yes [name], MD  busPIRone (BUSPAR) 10 MG tablet Take 1 tablet (10 mg total) by mouth 2 (two) times daily. 04/10/16  Yes [name], MD  cloNIDine (CATAPRES) 0.1 MG tablet Take 1 tablet (0.1 mg total) by mouth at bedtime. For withdrawal symptoms 04/10/16  Yes  [name], MD  docusate sodium (COLACE) 250 MG capsule Take 1 capsule (250 mg total) by mouth 2 (two) times daily. 03/26/16  Yes Donne Hazel, MD  ferrous sulfate 325 (65 FE) MG tablet Take 1 tablet (325 mg total) by mouth daily with breakfast. 03/27/16  Yes Donne Hazel, MD  furosemide (LASIX) 40 MG tablet Take 40 mg by mouth daily as needed for fluid.    Yes Historical Provider, MD  glucose blood (TRUE METRIX BLOOD GLUCOSE TEST) test strip Use 3 times daily before meals 03/13/16  Yes [name], MD  insulin aspart (NOVOLOG) 100 UNIT/ML injection Inject 0-15 Units into the skin 3 (three) times daily with  meals. Per sliding scale Patient taking differently: Inject 4-15 Units into the skin 3 (three) times daily with meals. Per sliding scale 01/31/16  Yes [name], MD  insulin detemir (LEVEMIR) 100 UNIT/ML injection Inject 0.25 mLs (25 Units total) into the skin at bedtime. 04/10/16  Yes [name], MD  Insulin Syringe-Needle U-100 (BD INSULIN SYRINGE ULTRAFINE) 31G X 15/64" 0.5 ML MISC 1 each by Does not apply route 4 (four) times daily as needed. 08/16/15  Yes [name], MD  metoCLOPramide (REGLAN) 10 MG tablet Take 1 tablet (10 mg total) by mouth 3 (three) times daily before meals. 03/26/16  Yes Donne Hazel, MD  ondansetron (ZOFRAN ODT) 4 MG disintegrating tablet Take 1 tablet (4 mg total) by mouth every 8 (eight) hours as needed for nausea or vomiting. 04/10/16  Yes [name], MD  polyethylene glycol (MIRALAX / GLYCOLAX) packet Take 17 g by mouth daily as needed for mild constipation. 03/20/16  Yes Janece Canterbury, MD  pregabalin (LYRICA) 150 MG capsule Take 1 capsule (150 mg total) by mouth 2 (two) times daily. 03/13/16  Yes [name], MD  erythromycin (E-MYCIN) 250 MG tablet Take 1 tablet (250 mg total) by mouth 3 (three) times daily with meals as needed. Patient not taking: Reported on 04/10/2016 03/20/16   Janece Canterbury, MD  FLUoxetine (PROZAC) 20 MG tablet Take 1 tablet (20 mg total) by mouth daily. Patient not taking: Reported on 04/11/2016 04/10/16   [name], MD    Family History Family History  Problem Relation Age of Onset  . Cystic fibrosis Mother   . Hypertension Father   . Diabetes Brother   . Hypertension Maternal Grandmother     Social History Social History  Substance Use Topics  . Smoking status: Never Smoker  . Smokeless tobacco: Never Used  . Alcohol use No     Allergies   Anesthetics, amide; Penicillins; Buprenorphine hcl; and Encainide   Review of Systems Review of Systems  Constitutional: Negative for chills and fever.  Respiratory:  Negative for cough.   Gastrointestinal: Positive for abdominal pain and vomiting.  Neurological: Negative for headaches.  All other systems reviewed and are negative.    Physical Exam Updated Vital Signs BP 170/80 (BP Location: Left Arm)   Pulse 108   Temp 98.8 F (37.1 C) (Oral)   Resp 18   LMP 02/15/2016 (Approximate)   SpO2 98%   Physical Exam  Constitutional: She is oriented to person, place, and time. She appears well-developed and well-nourished. No distress.  HENT:  Head: Normocephalic and atraumatic.  Nose: Nose normal.  Eyes: Conjunctivae and EOM are normal. Pupils are equal, round, and reactive to light. Right eye exhibits no discharge. Left eye exhibits no discharge. No scleral icterus.  Neck: Normal range of motion. Neck supple.  Cardiovascular: Normal rate and regular rhythm.  Exam reveals no gallop and no friction rub.   No murmur heard. Pulmonary/Chest: Effort normal and breath sounds normal. No stridor. No respiratory distress. She has no rales.  Abdominal: Soft. She exhibits no distension. There is no tenderness.  Musculoskeletal: She exhibits no edema or tenderness.  Neurological: She is alert and oriented to person, place, and time.  Skin: Skin is warm and dry. No rash noted. She is not diaphoretic. No erythema.  Psychiatric: She has a normal mood and affect.  Vitals reviewed.    ED Treatments / Results  Labs (all labs ordered are listed, but only abnormal results are displayed) Labs Reviewed  COMPREHENSIVE METABOLIC PANEL - Abnormal; Notable for the following:       Result Value   Chloride 100 (*)    Glucose, Bld 316 (*)    Creatinine, Ser 1.11 (*)    AST 68 (*)    Total Bilirubin 1.3 (*)    GFR calc non Af Amer 58 (*)    All other components within normal limits  CBC - Abnormal; Notable for the following:    Hemoglobin 11.0 (*)    HCT 34.8 (*)    MCV 75.3 (*)    MCH 23.8 (*)    RDW 22.1 (*)    Platelets 550 (*)    All other components  within normal limits  URINALYSIS, ROUTINE W REFLEX MICROSCOPIC (NOT AT [facility]) - Abnormal; Notable for the following:    Protein, ur 30 (*)    All other components within normal limits  URINE MICROSCOPIC-ADD ON - Abnormal; Notable for the following:    Squamous Epithelial / LPF 6-30 (*)    Bacteria, UA FEW (*)    All other components within normal limits  CBG MONITORING, ED - Abnormal; Notable for the following:    Glucose-Capillary 124 (*)    All other components within normal limits  LIPASE, BLOOD  POC URINE PREG, ED    EKG  EKG Interpretation None       Radiology No results found.  Procedures Procedures (including critical care time)  Medications Ordered in ED Medications  ondansetron (ZOFRAN-ODT) disintegrating tablet 8 mg (8 mg Oral Given 04/11/16 1554)  metoCLOPramide (REGLAN) injection 10 mg (10 mg Intravenous Given 04/11/16 2239)  sodium chloride 0.9 % bolus 1,000 mL (0 mLs Intravenous Stopped 04/12/16 0006)  haloperidol lactate (HALDOL) injection 5 mg (5 mg Intravenous Given 04/12/16 0012)     Initial Impression / Assessment and Plan / ED Course  I have reviewed the triage vital signs and the nursing notes.  Pertinent labs & imaging results that were available during my care of the patient were reviewed by me and considered in my medical decision making (see chart for details).  Clinical Course    Labs reassuring without evidence of diabetic ketoacidosis. Abdomen benign without evidence of peritonitis. Low suspicion for small bowel obstruction, appendicitis, diverticulitis. Patient provided with IV fluids and antiemedics. Patient's symptoms persisted.  Given 5 mg of IM Haldol significantly improved patient's symptomatology. She was able to tolerate by mouth challenge.   Safe for discharge with strict return precautions.  Final Clinical Impressions(s) / ED Diagnoses   Final diagnoses:  Non-intractable vomiting with nausea, unspecified vomiting type    Disposition: Discharge  Condition: Good  I have discussed the results, Dx and Tx plan with the patient who expressed understanding and agree(s) with the plan. Discharge instructions discussed at great length. The patient was given  strict return precautions who verbalized understanding of the instructions. No further questions at time of discharge.    Current Discharge Medication List      Follow Up: [name], MD [place]  [id number] [phone]  In 2 days If symptoms do not improve or  worsen      Fatima Blank, MD 04/12/16 [phone]

## 2016-04-12 LAB — DRUG ABUSE PANEL 10-50, U
AMPHETAMINES (1000 ng/mL SCRN): NEGATIVE
BARBITURATES: NEGATIVE
BENZODIAZEPINES: NEGATIVE
COCAINE METABOLITES: NEGATIVE
MARIJUANA MET (50 ng/mL SCRN): NEGATIVE
METHADONE: NEGATIVE
METHAQUALONE: NEGATIVE
OPIATES: NEGATIVE
PHENCYCLIDINE: NEGATIVE
PROPOXYPHENE: NEGATIVE

## 2016-04-13 ENCOUNTER — Telehealth: Payer: Self-pay

## 2016-04-13 NOTE — Telephone Encounter (Signed)
This Case Manager placed call to patient to inform her that the prescription for lyrica was faxed to the [facility] Patient Assistance Program on 04/11/16. Per Seabron Spates note, it will take 24-48 hours for request to be processed and then 7-10 business days for medication to ship to patient's home. Dr. Jarold Song has indicated gabapentin could be prescribed for patient until lyrica available. This Case Manager placed call to patient to discuss and to remind patient that 3 months of bank statements needed for [facility] application; however, unable to reach patient. HIPPA compliant voicemail left requesting return call.

## 2016-04-17 ENCOUNTER — Telehealth: Payer: Self-pay

## 2016-04-17 NOTE — Telephone Encounter (Signed)
Another call placed to the patient in attempt to explain the status of the lyrica prescription. This CM was able to reach the patient and she said that she has been " feeling good today" and noted that she has actually been feeling good for the past 4-5 days. She stated that she has been trying not to be stressed to try to control her panic attacks.  She noted that she received some upsetting financial news last night and was able to control her anxiety by taking an extra buspar tablet ( she took it three times yesterday instead of twice). She noted that she understands that it is only ordered to be taken twice a day.  Explained to her the status of the lyrica prescription - that it may be another 7-10 days before she receives it and Dr Jarold Song said that gabapentin could be prescribed in the meantime while waiting for the lyrica.  The patient said that she had taken lyrica in the past and did not like it and would wait for the lyrica.  She also inquired about a prescription for erythromycin that was prescribed by the physician who discharged her from the hospital to help control her gasroparesis. She said that she received a call from [facility] that the medication was too expensive. This CM spoke to Crissie Reese, [place], who stated that she had called the patient and left a message informing her that the pharmacy can't order the medication as it is too expensive ( about $1000.00) and suggested that she see her endocrinologist about an alternative. This CM informed the patient of the information about the medication and she noted that she does not have an endocrinologist.  Informed her that this message would be routed to Dr Jarold Song.   The patient stated that she understands that her son needs to remove her name from his money market account as it is a barrier to her being able to obtain an orange card and possibly medicaid. She said that her son  gave her access to his funds to use if she ever needed  money; but she realizes that it is not helping her access medical services.   She confirmed her appointment with Dr Jarold Song for 04/24/16 @ 1100.  No other questions/concerns reported at this time

## 2016-04-20 ENCOUNTER — Encounter (HOSPITAL_COMMUNITY): Payer: Self-pay | Admitting: Emergency Medicine

## 2016-04-20 ENCOUNTER — Inpatient Hospital Stay (HOSPITAL_COMMUNITY)
Admission: EM | Admit: 2016-04-20 | Discharge: 2016-04-23 | DRG: 074 | Disposition: A | Discharge status: Home / Self Care | Payer: Self-pay | Attending: Internal Medicine | Admitting: Internal Medicine

## 2016-04-20 DIAGNOSIS — E860 Dehydration: Secondary | ICD-10-CM | POA: Diagnosis present

## 2016-04-20 DIAGNOSIS — I10 Essential (primary) hypertension: Secondary | ICD-10-CM | POA: Diagnosis present

## 2016-04-20 DIAGNOSIS — K5909 Other constipation: Secondary | ICD-10-CM | POA: Diagnosis present

## 2016-04-20 DIAGNOSIS — Z79899 Other long term (current) drug therapy: Secondary | ICD-10-CM

## 2016-04-20 DIAGNOSIS — Z888 Allergy status to other drugs, medicaments and biological substances status: Secondary | ICD-10-CM

## 2016-04-20 DIAGNOSIS — K219 Gastro-esophageal reflux disease without esophagitis: Secondary | ICD-10-CM | POA: Diagnosis present

## 2016-04-20 DIAGNOSIS — F329 Major depressive disorder, single episode, unspecified: Secondary | ICD-10-CM | POA: Diagnosis present

## 2016-04-20 DIAGNOSIS — K3184 Gastroparesis: Secondary | ICD-10-CM | POA: Diagnosis present

## 2016-04-20 DIAGNOSIS — E1165 Type 2 diabetes mellitus with hyperglycemia: Secondary | ICD-10-CM | POA: Diagnosis present

## 2016-04-20 DIAGNOSIS — Z880 Allergy status to penicillin: Secondary | ICD-10-CM

## 2016-04-20 DIAGNOSIS — Z794 Long term (current) use of insulin: Secondary | ICD-10-CM

## 2016-04-20 DIAGNOSIS — K30 Functional dyspepsia: Secondary | ICD-10-CM | POA: Diagnosis present

## 2016-04-20 DIAGNOSIS — E1143 Type 2 diabetes mellitus with diabetic autonomic (poly)neuropathy: Principal | ICD-10-CM | POA: Diagnosis present

## 2016-04-20 DIAGNOSIS — R739 Hyperglycemia, unspecified: Secondary | ICD-10-CM

## 2016-04-20 DIAGNOSIS — E1010 Type 1 diabetes mellitus with ketoacidosis without coma: Secondary | ICD-10-CM

## 2016-04-20 LAB — GLUCOSE, CAPILLARY
Glucose-Capillary: 349 mg/dL — ABNORMAL HIGH (ref 65–99)
Glucose-Capillary: 182 mg/dL — ABNORMAL HIGH (ref 65–99)

## 2016-04-20 LAB — URINE MICROSCOPIC-ADD ON

## 2016-04-20 LAB — CBC
HEMATOCRIT: 40.6 % (ref 36.0–46.0)
Hemoglobin: 12.6 g/dL (ref 12.0–15.0)
MCH: 24.1 pg — AB (ref 26.0–34.0)
MCHC: 31 g/dL (ref 30.0–36.0)
MCV: 77.8 fL — AB (ref 78.0–100.0)
Platelets: 462 10*3/uL — ABNORMAL HIGH (ref 150–400)
RBC: 5.22 MIL/uL — ABNORMAL HIGH (ref 3.87–5.11)
RDW: 22.1 % — AB (ref 11.5–15.5)
WBC: 7.7 10*3/uL (ref 4.0–10.5)

## 2016-04-20 LAB — COMPREHENSIVE METABOLIC PANEL
ALBUMIN: 4.8 g/dL (ref 3.5–5.0)
ALT: 28 U/L (ref 14–54)
AST: 25 U/L (ref 15–41)
Alkaline Phosphatase: 107 U/L (ref 38–126)
Anion gap: 13 (ref 5–15)
BUN: 14 mg/dL (ref 6–20)
CHLORIDE: 99 mmol/L — AB (ref 101–111)
CO2: 24 mmol/L (ref 22–32)
CREATININE: 1.12 mg/dL — AB (ref 0.44–1.00)
Calcium: 10 mg/dL (ref 8.9–10.3)
GFR calc Af Amer: >60 mL/min (ref >60)
GFR, EST NON AFRICAN AMERICAN: 57 mL/min — AB (ref >60)
Glucose, Bld: 408 mg/dL — ABNORMAL HIGH (ref 65–99)
POTASSIUM: 4.2 mmol/L (ref 3.5–5.1)
SODIUM: 136 mmol/L (ref 135–145)
Total Bilirubin: 1.3 mg/dL — ABNORMAL HIGH (ref 0.3–1.2)
Total Protein: 9 g/dL — ABNORMAL HIGH (ref 6.5–8.1)

## 2016-04-20 LAB — URINALYSIS, ROUTINE W REFLEX MICROSCOPIC
Bilirubin Urine: NEGATIVE
Ketones, ur: 40 mg/dL — AB
LEUKOCYTES UA: NEGATIVE
Nitrite: NEGATIVE
PH: 6 (ref 5.0–8.0)
Protein, ur: 100 mg/dL — AB
Specific Gravity, Urine: 1.029 (ref 1.005–1.030)

## 2016-04-20 LAB — CBG MONITORING, ED
GLUCOSE-CAPILLARY: 324 mg/dL — AB (ref 65–99)
Glucose-Capillary: 270 mg/dL — ABNORMAL HIGH (ref 65–99)

## 2016-04-20 LAB — LIPASE, BLOOD: LIPASE: 52 U/L — AB (ref 11–51)

## 2016-04-20 MED ORDER — INSULIN ASPART 100 UNIT/ML ~~LOC~~ SOLN
0.0000 [IU] | Freq: Three times a day (TID) | SUBCUTANEOUS | Status: DC
  Administered 2016-04-20: 15 [IU] via SUBCUTANEOUS
  Administered 2016-04-21: 7 [IU] via SUBCUTANEOUS
  Administered 2016-04-21: 4 [IU] via SUBCUTANEOUS
  Administered 2016-04-22: 11 [IU] via SUBCUTANEOUS
  Administered 2016-04-23: 4 [IU] via SUBCUTANEOUS

## 2016-04-20 MED ORDER — SODIUM CHLORIDE 0.9 % IV SOLN: 250.0000 mg | Freq: Three times a day (TID) | INTRAVENOUS | Status: DC

## 2016-04-20 MED ORDER — HYDRALAZINE HCL 20 MG/ML IJ SOLN: 10.0000 mg | Freq: Four times a day (QID) | INTRAMUSCULAR | Status: DC | PRN

## 2016-04-20 MED ORDER — PREGABALIN 50 MG PO CAPS
150.0000 mg | ORAL_CAPSULE | Freq: Two times a day (BID) | ORAL | Status: DC
  Administered 2016-04-20 – 2016-04-23 (×6): 150 mg via ORAL
  Filled 2016-04-20 (×6): qty 3

## 2016-04-20 MED ORDER — INSULIN ASPART 100 UNIT/ML ~~LOC~~ SOLN
0.0000 [IU] | Freq: Every day | SUBCUTANEOUS | Status: DC
  Administered 2016-04-22: 2 [IU] via SUBCUTANEOUS

## 2016-04-20 MED ORDER — SODIUM CHLORIDE 0.9 % IV BOLUS (SEPSIS)
1000.0000 mL | Freq: Once | INTRAVENOUS | Status: AC
  Administered 2016-04-20: 1000 mL via INTRAVENOUS

## 2016-04-20 MED ORDER — CLONIDINE HCL 0.1 MG PO TABS
0.1000 mg | ORAL_TABLET | Freq: Every day | ORAL | Status: DC
  Administered 2016-04-20 – 2016-04-22 (×3): 0.1 mg via ORAL
  Filled 2016-04-20 (×3): qty 1

## 2016-04-20 MED ORDER — BUSPIRONE HCL 10 MG PO TABS
10.0000 mg | ORAL_TABLET | Freq: Two times a day (BID) | ORAL | Status: DC
  Administered 2016-04-20 – 2016-04-23 (×6): 10 mg via ORAL
  Filled 2016-04-20 (×6): qty 1

## 2016-04-20 MED ORDER — ALBUTEROL SULFATE (2.5 MG/3ML) 0.083% IN NEBU: 2.5000 mg | INHALATION_SOLUTION | Freq: Four times a day (QID) | RESPIRATORY_TRACT | Status: DC | PRN

## 2016-04-20 MED ORDER — FAMOTIDINE IN NACL 20-0.9 MG/50ML-% IV SOLN
20.0000 mg | Freq: Two times a day (BID) | INTRAVENOUS | Status: DC
  Administered 2016-04-20 – 2016-04-22 (×4): 20 mg via INTRAVENOUS
  Filled 2016-04-20 (×4): qty 50

## 2016-04-20 MED ORDER — ENOXAPARIN SODIUM 40 MG/0.4ML ~~LOC~~ SOLN
40.0000 mg | SUBCUTANEOUS | Status: DC
  Administered 2016-04-20 – 2016-04-22 (×3): 40 mg via SUBCUTANEOUS
  Filled 2016-04-20 (×3): qty 0.4

## 2016-04-20 MED ORDER — SODIUM CHLORIDE 0.9 % IV SOLN
200.0000 mg | Freq: Three times a day (TID) | INTRAVENOUS | Status: DC
  Administered 2016-04-20 – 2016-04-22 (×5): 200 mg via INTRAVENOUS
  Filled 2016-04-20 (×6): qty 4

## 2016-04-20 MED ORDER — METOCLOPRAMIDE HCL 5 MG/ML IJ SOLN
10.0000 mg | Freq: Three times a day (TID) | INTRAMUSCULAR | Status: DC
  Administered 2016-04-20 – 2016-04-22 (×5): 10 mg via INTRAVENOUS
  Filled 2016-04-20 (×5): qty 2

## 2016-04-20 MED ORDER — INSULIN DETEMIR 100 UNIT/ML ~~LOC~~ SOLN
25.0000 [IU] | Freq: Every day | SUBCUTANEOUS | Status: DC
  Administered 2016-04-20 – 2016-04-21 (×2): 25 [IU] via SUBCUTANEOUS
  Filled 2016-04-20 (×3): qty 0.25

## 2016-04-20 MED ORDER — ONDANSETRON HCL 4 MG/2ML IJ SOLN
4.0000 mg | Freq: Four times a day (QID) | INTRAMUSCULAR | Status: DC | PRN
Start: 2016-04-20 — End: 2016-04-22
  Administered 2016-04-20: 4 mg via INTRAVENOUS
  Filled 2016-04-20: qty 2

## 2016-04-20 MED ORDER — METOCLOPRAMIDE HCL 5 MG/ML IJ SOLN
10.0000 mg | Freq: Once | INTRAMUSCULAR | Status: AC
  Administered 2016-04-20: 10 mg via INTRAVENOUS
  Filled 2016-04-20: qty 2

## 2016-04-20 MED ORDER — ALBUTEROL SULFATE HFA 108 (90 BASE) MCG/ACT IN AERS: 2.0000 | INHALATION_SPRAY | Freq: Four times a day (QID) | RESPIRATORY_TRACT | Status: DC | PRN

## 2016-04-20 MED ORDER — SODIUM CHLORIDE 0.9 % IV SOLN
INTRAVENOUS | Status: DC
  Administered 2016-04-20 – 2016-04-21 (×2): via INTRAVENOUS

## 2016-04-20 MED ORDER — LORAZEPAM 2 MG/ML IJ SOLN
0.5000 mg | Freq: Once | INTRAMUSCULAR | Status: AC
  Administered 2016-04-20: 0.5 mg via INTRAVENOUS
  Filled 2016-04-20: qty 1

## 2016-04-20 NOTE — ED Triage Notes (Signed)
Patient reports generalized abdominal pain with nausea and vomiting that started last night.  Patient denies diarrhea, fevers.

## 2016-04-20 NOTE — H&P (Signed)
History and Physical    [name] [id number] DOB: [birth year] DOA: 04/20/2016  PCP: [name], MD  Patient coming from: Home.   Chief Complaint: nausea, vomiting with some abdominal pain since 3 days.   HPI: [name] is a 50 y.o. female with medical history significant of gastroparesis, uncontrolled diabetes mellitus, depression, hypertension, GERD, hyperlipidemia, presents today with the above symptoms. She was recently discharged and recommended to follow up with gastroenterologist as outpatient. She denies any other complaints other than nausea, vomiting and abd pain. She reports that she was unable to take any meds and her blood sugars were elevated.  On arrival to ED, she was found to have blood sugar of 408. Creatinine is elevated at 1.1    Review of Systems: As per HPI otherwise 10 point review of systems negative.  She denies any chest pain, sob, pedal edema. No fevers or chills.   Past Medical History:  Diagnosis Date  . Depression   . Diabetes mellitus without complication ([place])   . Essential hypertension   . Gastroparesis   . GERD (gastroesophageal reflux disease)   . HLD (hyperlipidemia)     Past Surgical History:  Procedure Laterality Date  . COLONOSCOPY  09/27/2014   at [facility]  . ESOPHAGOGASTRODUODENOSCOPY  09/27/2014   at [facility], Dr Rolan Lipa. biospy neg for celiac, neg for H pylori.   . EYE SURGERY    . gailstones    . IR GENERIC HISTORICAL  01/24/2016   IR FLUORO GUIDE CV LINE RIGHT 01/24/2016 Darrell K Allred, PA-C [facility]  . IR GENERIC HISTORICAL  01/24/2016   IR US GUIDE VASC ACCESS RIGHT 01/24/2016 Darrell K Allred, PA-C [facility]  . POSTERIOR VITRECTOMY AND MEMBRANE PEEL-LEFT EYE  09/28/2002  . POSTERIOR VITRECTOMY AND MEMBRANE PEEL-RIGHT EYE  03/16/2002  . RETINAL DETACHMENT SURGERY       reports that she has never smoked. She has never used smokeless tobacco. She reports that she does not drink alcohol or use drugs.  Allergies    Allergen Reactions  . Anesthetics, Amide Nausea And Vomiting  . Penicillins Diarrhea and Nausea And Vomiting    Has patient had a PCN reaction causing immediate rash, facial/tongue/throat swelling, SOB or lightheadedness with hypotension: No Has patient had a PCN reaction causing severe rash involving mucus membranes or skin necrosis: No Has patient had a PCN reaction that required hospitalization No Has patient had a PCN reaction occurring within the last 10 years: Yes  If all of the above answers are "NO", then may proceed with Cephalosporin use.   . Buprenorphine Hcl Rash  . Encainide Nausea And Vomiting    Family History  Problem Relation Age of Onset  . Cystic fibrosis Mother   . Hypertension Father   . Diabetes Brother   . Hypertension Maternal Grandmother    Family history reviewed and not pertinent (If you reviewed it)  Prior to Admission medications   Medication Sig Start Date End Date Taking? Authorizing Provider  albuterol (PROVENTIL HFA;VENTOLIN HFA) 108 (90 Base) MCG/ACT inhaler Inhale 2 puffs into the lungs every 6 (six) hours as needed for wheezing or shortness of breath. 10/20/15  Yes [name], MD  busPIRone (BUSPAR) 10 MG tablet Take 1 tablet (10 mg total) by mouth 2 (two) times daily. 04/10/16  Yes [name], MD  cloNIDine (CATAPRES) 0.1 MG tablet Take 1 tablet (0.1 mg total) by mouth at bedtime. For withdrawal symptoms 04/10/16  Yes [name], MD  docusate sodium (COLACE)  250 MG capsule Take 1 capsule (250 mg total) by mouth 2 (two) times daily. 03/26/16  Yes Donne Hazel, MD  ferrous sulfate 325 (65 FE) MG tablet Take 1 tablet (325 mg total) by mouth daily with breakfast. 03/27/16  Yes Donne Hazel, MD  furosemide (LASIX) 40 MG tablet Take 40 mg by mouth daily as needed for fluid.    Yes Historical Provider, MD  insulin aspart (NOVOLOG) 100 UNIT/ML injection Inject 0-15 Units into the skin 3 (three) times daily with meals. Per sliding scale Patient  taking differently: Inject 4-15 Units into the skin 3 (three) times daily with meals. Per sliding scale 01/31/16  Yes [name], MD  insulin detemir (LEVEMIR) 100 UNIT/ML injection Inject 0.25 mLs (25 Units total) into the skin at bedtime. 04/10/16  Yes [name], MD  metoCLOPramide (REGLAN) 10 MG tablet Take 1 tablet (10 mg total) by mouth 3 (three) times daily before meals. 03/26/16  Yes Donne Hazel, MD  ondansetron (ZOFRAN ODT) 4 MG disintegrating tablet Take 1 tablet (4 mg total) by mouth every 8 (eight) hours as needed for nausea or vomiting. 04/10/16  Yes [name], MD  polyethylene glycol (MIRALAX / GLYCOLAX) packet Take 17 g by mouth daily as needed for mild constipation. 03/20/16  Yes Janece Canterbury, MD  pregabalin (LYRICA) 150 MG capsule Take 1 capsule (150 mg total) by mouth 2 (two) times daily. 03/13/16  Yes [name], MD  Blood Glucose Monitoring Suppl (TRUE METRIX METER) DEVI 1 each by Does not apply route 3 (three) times daily before meals. 07/19/15   [name], MD  erythromycin (E-MYCIN) 250 MG tablet Take 1 tablet (250 mg total) by mouth 3 (three) times daily with meals as needed. Patient not taking: Reported on 04/20/2016 03/20/16   Janece Canterbury, MD  FLUoxetine (PROZAC) 20 MG tablet Take 1 tablet (20 mg total) by mouth daily. Patient not taking: Reported on 04/20/2016 04/10/16   [name], MD  glucose blood (TRUE METRIX BLOOD GLUCOSE TEST) test strip Use 3 times daily before meals 03/13/16   [name], MD  Insulin Syringe-Needle U-100 (BD INSULIN SYRINGE ULTRAFINE) 31G X 15/64" 0.5 ML MISC 1 each by Does not apply route 4 (four) times daily as needed. 08/16/15   [name], MD    Physical Exam: Vitals:   04/20/16 1355 04/20/16 1555 04/20/16 1727 04/20/16 1800  BP: (!) 217/137 166/86 (!) 166/104 (!) 164/76  Pulse: 119 109 107 (!) 107  Resp: 22 22 20 20   Temp:    98.8 F (37.1 C)  TempSrc:    Oral  SpO2: 100% 96% 100% 98%  Weight:      Height:           Constitutional: anxious , dry heaving,  Vitals:   04/20/16 1355 04/20/16 1555 04/20/16 1727 04/20/16 1800  BP: (!) 217/137 166/86 (!) 166/104 (!) 164/76  Pulse: 119 109 107 (!) 107  Resp: 22 22 20 20   Temp:    98.8 F (37.1 C)  TempSrc:    Oral  SpO2: 100% 96% 100% 98%  Weight:      Height:       Eyes: PERRL, lids and conjunctivae normal ENMT: Mucous membranes are dry.  Neck: normal, supple, no masses, no thyromegaly Respiratory: clear to auscultation bilaterally, no wheezing, no crackles. Normal respiratory effort. No accessory muscle use.  Cardiovascular: Regular rate and rhythm, no murmurs / rubs / gallops. No extremity edema. 2+ pedal pulses. Abdomen: no tenderness,  no masses palpated. Bowel sounds positive.  Musculoskeletal: no clubbing / cyanosis. No joint deformity upper and lower extremities. Good ROM, no contractures. Normal muscle tone.  Skin: no rashes, lesions, ulcers. No induration Neurologic: CN 2-12 grossly intact. Sensation intact, DTR normal. Strength 5/5 in all 4.  Psychiatric: anxious. Alert and oriented x 3. Normal mood.     Labs on Admission: I have personally reviewed following labs and imaging studies  CBC:  Recent Labs Lab 04/20/16 1409  WBC 7.7  HGB 12.6  HCT 40.6  MCV 77.8*  PLT [id number]*   Basic Metabolic Panel:  Recent Labs Lab 04/20/16 1409  NA 136  K 4.2  CL 99*  CO2 24  GLUCOSE 408*  BUN 14  CREATININE 1.12*  CALCIUM 10.0   GFR: Estimated Creatinine Clearance: 55.2 mL/min (by C-G formula based on SCr of 1.12 mg/dL (H)). Liver Function Tests:  Recent Labs Lab 04/20/16 1409  AST 25  ALT 28  ALKPHOS 107  BILITOT 1.3*  PROT 9.0*  ALBUMIN 4.8    Recent Labs Lab 04/20/16 1409  LIPASE 52*   No results for input(s): AMMONIA in the last 168 hours. Coagulation Profile: No results for input(s): INR, PROTIME in the last 168 hours. Cardiac Enzymes: No results for input(s): CKTOTAL, CKMB, CKMBINDEX, TROPONINI in the  last 168 hours. BNP (last 3 results) No results for input(s): PROBNP in the last 8760 hours. HbA1C: No results for input(s): HGBA1C in the last 72 hours. CBG:  Recent Labs Lab 04/20/16 0851 04/20/16 1706 04/20/16 1801  GLUCAP 270* 324* 349*   Lipid Profile: No results for input(s): CHOL, HDL, LDLCALC, TRIG, CHOLHDL, LDLDIRECT in the last 72 hours. Thyroid Function Tests: No results for input(s): TSH, T4TOTAL, FREET4, T3FREE, THYROIDAB in the last 72 hours. Anemia Panel: No results for input(s): VITAMINB12, FOLATE, FERRITIN, TIBC, IRON, RETICCTPCT in the last 72 hours. Urine analysis:    Component Value Date/Time   COLORURINE YELLOW 04/20/2016 [place] 04/20/2016 1355   LABSPEC 1.029 04/20/2016 1355   PHURINE 6.0 04/20/2016 1355   GLUCOSEU >1000 (A) 04/20/2016 1355   HGBUR TRACE (A) 04/20/2016 1355   BILIRUBINUR NEGATIVE 04/20/2016 1355   BILIRUBINUR NEGATIVE 08/04/2012 1005   KETONESUR 40 (A) 04/20/2016 1355   PROTEINUR 100 (A) 04/20/2016 1355   UROBILINOGEN 0.2 04/27/2015 1608   NITRITE NEGATIVE 04/20/2016 1355   LEUKOCYTESUR NEGATIVE 04/20/2016 1355   Sepsis Labs: !!!!!!!!!!!!!!!!!!!!!!!!!!!!!!!!!!!!!!!!!!!! @LABRCNTIP (procalcitonin:4,lacticidven:4) )No results found for this or any previous visit (from the past 240 hour(s)).   Radiological Exams on Admission: No results found.  EKG: not done.   Assessment/Plan Active Problems:   Gastroparesis    Nausea, vomiting and mild abdominal discomfort probably from gastroparesis.  Admitted for iV fluids, IV anti emetics, IV reglan and IV erythromycin.  Will get GI consult in am for further evaluation.    Diabetes mellitus: CBG (last 3)   Recent Labs  04/20/16 0851 04/20/16 1706 04/20/16 1801  GLUCAP 270* 324* 349*    Restart her long acting and SSI.    Hypertension: sub optimal. Restart her home meds and Hydralazine prn   GERD: IV pepcid.   DVT prophylaxis: lovenox.  Code Status:  full code.  Family Communication: none at bedside.  Disposition Plan: pending further evaluation.  Consults called: none  Admission status: obs medical floor.    Hosie Poisson MD [facility] Pager [phone]  If 7PM-7AM, please contact night-coverage [email or web address] Password [email or web address]  04/20/2016, 6:03 PM

## 2016-04-20 NOTE — ED Notes (Signed)
Bed: [facility] Expected date:  Expected time:  Means of arrival:  Comments: 

## 2016-04-20 NOTE — ED Notes (Signed)
Pt roomed in triage rm #4 due to pt's in waiting room complaining about her gagging and spitting up.

## 2016-04-20 NOTE — ED Triage Notes (Addendum)
Per EMS patient to ER from nausea/vomiting.  Patient has history for gastroparesis.

## 2016-04-20 NOTE — ED Provider Notes (Signed)
[facility] DEPT Provider Note   CSN: [id number] Arrival date & time: 04/20/16  0845     History   Chief Complaint Chief Complaint  Patient presents with  . Abdominal Pain    HPI [name] is a 50 y.o. female.  HPI Patient presents with nausea and vomiting. History of gastroparesis. States it began last night. No fevers. She is diaphoretic on my exam. States she has dull upper abdominal pain. States she's been throwing up her medicines. No dysuria. No diarrhea. States this feels like her previous gastroparesis.   Past Medical History:  Diagnosis Date  . Depression   . Diabetes mellitus without complication ([place])   . Essential hypertension   . Gastroparesis   . GERD (gastroesophageal reflux disease)   . HLD (hyperlipidemia)     Patient Active Problem List   Diagnosis Date Noted  . Gastroparesis 04/20/2016  . Type 1 diabetes mellitus with ketoacidosis without coma ([place])   . DKA (diabetic ketoacidoses) ([place]) 03/18/2016  . Noncompliance with treatment plan 03/13/2016  . Essential hypertension 01/24/2016  . Leukocytosis 01/24/2016  . Abnormal ECG 11/27/2015  . HLD (hyperlipidemia)   . GERD (gastroesophageal reflux disease)   . Metabolic acidosis, normal anion gap (NAG)   . AKI (acute kidney injury) ([place])   . Anemia, iron deficiency   . DKA, type 1, not at goal [facility])   . Hyponatremia 09/02/2015  . Vitamin B12 deficiency 08/16/2015  . Lactic acidosis 07/29/2015  . Acute pyelonephritis 07/29/2015  . Pyelonephritis   . Hypothermia   . Diabetic gastroparesis ([place])   . Diabetic neuropathy, type I diabetes mellitus ([place]) 05/18/2015  . Anemia 05/18/2015  . Anxiety and depression 05/18/2015  . Nausea & vomiting   . Diabetic ketoacidosis without coma associated with type 1 diabetes mellitus ([place])   . High anion gap metabolic acidosis [id number]    Past Surgical History:  Procedure Laterality Date  . COLONOSCOPY  09/27/2014   at [facility]  .  ESOPHAGOGASTRODUODENOSCOPY  09/27/2014   at [facility], Dr Rolan Lipa. biospy neg for celiac, neg for H pylori.   . EYE SURGERY    . gailstones    . IR GENERIC HISTORICAL  01/24/2016   IR FLUORO GUIDE CV LINE RIGHT 01/24/2016 Darrell K Allred, PA-C [facility]  . IR GENERIC HISTORICAL  01/24/2016   IR US GUIDE VASC ACCESS RIGHT 01/24/2016 Darrell K Allred, PA-C [facility]  . POSTERIOR VITRECTOMY AND MEMBRANE PEEL-LEFT EYE  09/28/2002  . POSTERIOR VITRECTOMY AND MEMBRANE PEEL-RIGHT EYE  03/16/2002  . RETINAL DETACHMENT SURGERY      OB History    No data available       Home Medications    Prior to Admission medications   Medication Sig Start Date End Date Taking? Authorizing Provider  albuterol (PROVENTIL HFA;VENTOLIN HFA) 108 (90 Base) MCG/ACT inhaler Inhale 2 puffs into the lungs every 6 (six) hours as needed for wheezing or shortness of breath. 10/20/15  Yes [name], MD  busPIRone (BUSPAR) 10 MG tablet Take 1 tablet (10 mg total) by mouth 2 (two) times daily. 04/10/16  Yes [name], MD  cloNIDine (CATAPRES) 0.1 MG tablet Take 1 tablet (0.1 mg total) by mouth at bedtime. For withdrawal symptoms 04/10/16  Yes [name], MD  docusate sodium (COLACE) 250 MG capsule Take 1 capsule (250 mg total) by mouth 2 (two) times daily. 03/26/16  Yes Donne Hazel, MD  ferrous sulfate 325 (65 FE) MG tablet Take 1 tablet (325 mg  total) by mouth daily with breakfast. 03/27/16  Yes Donne Hazel, MD  furosemide (LASIX) 40 MG tablet Take 40 mg by mouth daily as needed for fluid.    Yes Historical Provider, MD  insulin aspart (NOVOLOG) 100 UNIT/ML injection Inject 0-15 Units into the skin 3 (three) times daily with meals. Per sliding scale Patient taking differently: Inject 4-15 Units into the skin 3 (three) times daily with meals. Per sliding scale 01/31/16  Yes [name], MD  insulin detemir (LEVEMIR) 100 UNIT/ML injection Inject 0.25 mLs (25 Units total) into the skin at bedtime. 04/10/16  Yes  [name], MD  metoCLOPramide (REGLAN) 10 MG tablet Take 1 tablet (10 mg total) by mouth 3 (three) times daily before meals. 03/26/16  Yes Donne Hazel, MD  ondansetron (ZOFRAN ODT) 4 MG disintegrating tablet Take 1 tablet (4 mg total) by mouth every 8 (eight) hours as needed for nausea or vomiting. 04/10/16  Yes [name], MD  polyethylene glycol (MIRALAX / GLYCOLAX) packet Take 17 g by mouth daily as needed for mild constipation. 03/20/16  Yes Janece Canterbury, MD  pregabalin (LYRICA) 150 MG capsule Take 1 capsule (150 mg total) by mouth 2 (two) times daily. 03/13/16  Yes [name], MD  Blood Glucose Monitoring Suppl (TRUE METRIX METER) DEVI 1 each by Does not apply route 3 (three) times daily before meals. 07/19/15   [name], MD  erythromycin (E-MYCIN) 250 MG tablet Take 1 tablet (250 mg total) by mouth 3 (three) times daily with meals as needed. Patient not taking: Reported on 04/20/2016 03/20/16   Janece Canterbury, MD  FLUoxetine (PROZAC) 20 MG tablet Take 1 tablet (20 mg total) by mouth daily. Patient not taking: Reported on 04/20/2016 04/10/16   [name], MD  glucose blood (TRUE METRIX BLOOD GLUCOSE TEST) test strip Use 3 times daily before meals 03/13/16   [name], MD  Insulin Syringe-Needle U-100 (BD INSULIN SYRINGE ULTRAFINE) 31G X 15/64" 0.5 ML MISC 1 each by Does not apply route 4 (four) times daily as needed. 08/16/15   [name], MD    Family History Family History  Problem Relation Age of Onset  . Cystic fibrosis Mother   . Hypertension Father   . Diabetes Brother   . Hypertension Maternal Grandmother     Social History Social History  Substance Use Topics  . Smoking status: Never Smoker  . Smokeless tobacco: Never Used  . Alcohol use No     Allergies   Anesthetics, amide; Penicillins; Buprenorphine hcl; and Encainide   Review of Systems Review of Systems  Constitutional: Positive for diaphoresis and fatigue. Negative for appetite change.    HENT: Negative for congestion.   Respiratory: Negative for shortness of breath.   Cardiovascular: Negative for chest pain.  Gastrointestinal: Positive for abdominal pain, nausea and vomiting.  Genitourinary: Positive for dysuria.  Musculoskeletal: Negative for back pain.  Skin: Negative for wound.  Neurological: Negative for light-headedness.     Physical Exam Updated Vital Signs BP 166/86   Pulse 109   Temp 98.4 F (36.9 C) (Oral)   Resp 22   Ht 5\' 2"  (1.575 m)   Wt 148 lb (67.1 kg)   LMP 02/15/2016 (Approximate)   SpO2 96%   BMI 27.07 kg/m   Physical Exam  Constitutional: She appears well-developed.  HENT:  Head: Normocephalic.  Eyes: EOM are normal.  Neck: Neck supple.  Cardiovascular:  Mild tachycardia  Pulmonary/Chest: Effort normal.  Abdominal: Soft.  Mild upper abdominal  tenderness without rebound or guarding.  Musculoskeletal: Normal range of motion.  Neurological: She is alert.  Skin: Skin is warm. Capillary refill takes less than 2 seconds. She is diaphoretic.  Psychiatric: She has a normal mood and affect.     ED Treatments / Results  Labs (all labs ordered are listed, but only abnormal results are displayed) Labs Reviewed  LIPASE, BLOOD - Abnormal; Notable for the following:       Result Value   Lipase 52 (*)    All other components within normal limits  COMPREHENSIVE METABOLIC PANEL - Abnormal; Notable for the following:    Chloride 99 (*)    Glucose, Bld 408 (*)    Creatinine, Ser 1.12 (*)    Total Protein 9.0 (*)    Total Bilirubin 1.3 (*)    GFR calc non Af Amer 57 (*)    All other components within normal limits  CBC - Abnormal; Notable for the following:    RBC 5.22 (*)    MCV 77.8 (*)    MCH 24.1 (*)    RDW 22.1 (*)    Platelets 462 (*)    All other components within normal limits  URINALYSIS, ROUTINE W REFLEX MICROSCOPIC (NOT AT [facility]) - Abnormal; Notable for the following:    Glucose, UA >1000 (*)    Hgb urine dipstick TRACE  (*)    Ketones, ur 40 (*)    Protein, ur 100 (*)    All other components within normal limits  URINE MICROSCOPIC-ADD ON - Abnormal; Notable for the following:    Squamous Epithelial / LPF 0-5 (*)    Bacteria, UA FEW (*)    All other components within normal limits  CBG MONITORING, ED - Abnormal; Notable for the following:    Glucose-Capillary 270 (*)    All other components within normal limits    EKG  EKG Interpretation None       Radiology No results found.  Procedures Procedures (including critical care time)  Medications Ordered in ED Medications  sodium chloride 0.9 % bolus 1,000 mL (1,000 mLs Intravenous New Bag/Given 04/20/16 1630)  metoCLOPramide (REGLAN) injection 10 mg (10 mg Intravenous Given 04/20/16 1417)  LORazepam (ATIVAN) injection 0.5 mg (0.5 mg Intravenous Given 04/20/16 1417)  sodium chloride 0.9 % bolus 1,000 mL (0 mLs Intravenous Stopped 04/20/16 1517)     Initial Impression / Assessment and Plan / ED Course  I have reviewed the triage vital signs and the nursing notes.  Pertinent labs & imaging results that were available during my care of the patient were reviewed by me and considered in my medical decision making (see chart for details).  Clinical Course    Patient with nausea vomiting. History of same. Hyperglycemia with ketones in the urine but no anion gap. Continued nausea with decreased oral intake. Will admit to internal medicine.  Final Clinical Impressions(s) / ED Diagnoses   Final diagnoses:  Gastroparesis  Hyperglycemia  Dehydration    New Prescriptions New Prescriptions   No medications on file     Davonna Belling, MD 04/20/16 [phone]

## 2016-04-21 LAB — GLUCOSE, CAPILLARY
GLUCOSE-CAPILLARY: 28 mg/dL — AB (ref 65–99)
GLUCOSE-CAPILLARY: 241 mg/dL — AB (ref 65–99)
GLUCOSE-CAPILLARY: 187 mg/dL — AB (ref 65–99)
Glucose-Capillary: 200 mg/dL — ABNORMAL HIGH (ref 65–99)
Glucose-Capillary: 104 mg/dL — ABNORMAL HIGH (ref 65–99)

## 2016-04-21 LAB — BASIC METABOLIC PANEL
ANION GAP: 8 (ref 5–15)
BUN: 15 mg/dL (ref 6–20)
CALCIUM: 8.6 mg/dL — AB (ref 8.9–10.3)
CO2: 23 mmol/L (ref 22–32)
Chloride: 108 mmol/L (ref 101–111)
Creatinine, Ser: 1.1 mg/dL — ABNORMAL HIGH (ref 0.44–1.00)
GFR, EST NON AFRICAN AMERICAN: 58 mL/min — AB (ref >60)
GLUCOSE: 34 mg/dL — AB (ref 65–99)
Potassium: 3.9 mmol/L (ref 3.5–5.1)
SODIUM: 139 mmol/L (ref 135–145)

## 2016-04-21 MED ORDER — MAGNESIUM HYDROXIDE 400 MG/5ML PO SUSP: 30.0000 mL | Freq: Every day | ORAL | Status: DC | PRN

## 2016-04-21 MED ORDER — DEXTROSE 50 % IV SOLN
INTRAVENOUS | Status: AC
  Administered 2016-04-21: 50 mL
  Filled 2016-04-21: qty 50

## 2016-04-21 MED ORDER — SENNOSIDES-DOCUSATE SODIUM 8.6-50 MG PO TABS
2.0000 | ORAL_TABLET | Freq: Two times a day (BID) | ORAL | Status: DC
  Administered 2016-04-21 – 2016-04-22 (×2): 2 via ORAL
  Filled 2016-04-21 (×3): qty 2

## 2016-04-21 NOTE — Progress Notes (Signed)
PROGRESS NOTE    [name]  [id number] DOB: 1966/01/12 DOA: 04/20/2016 PCP: [name], MD    Brief Narrative: [name] is a 50 y.o. female with medical history significant of gastroparesis, uncontrolled diabetes mellitus, depression, hypertension, GERD, hyperlipidemia, presents with nausea, vomiting and abdo pain since 3 days prior to admission, she reports the symptoms are similar to the gastroparesis she has intermittently.    Assessment & Plan:   Active Problems:   Gastroparesis   Worsening gastroparesis:  Improving with symptomatic management with IV anti emetics, iv REGLAN  And IV erythromycin.  Gi consulted for recommendations for recurrent admissions.  Advance diet as tolerated.    Diabetes Mellitus: CBG (last 3)   Recent Labs  04/21/16 0737 04/21/16 1151 04/21/16 1819  GLUCAP 241* 200* 104*    Resume long acting and ssi.    Hypertension:  Controlled.    GERD: IV pepcid.  Change to po in am.     DVT prophylaxis: (Lovenox) Code Status: (Full Family Communication: none at bedside.  Disposition Plan: home when symptoms improved.   Consultants:   gastroenterology   Procedures:  None.    Antimicrobials: none.    Subjective: Reports feeling better, requesting for haldol.   Objective: Vitals:   04/20/16 1800 04/20/16 2155 04/21/16 0437 04/21/16 1411  BP: (!) 164/76 127/61 (!) 166/49 (!) 103/51  Pulse: (!) 107 96 77 81  Resp: 20 18 18 18   Temp: 98.8 F (37.1 C) 99.3 F (37.4 C) 97.3 F (36.3 C) 98.1 F (36.7 C)  TempSrc: Oral Oral Oral Oral  SpO2: 98% 97% 99% 97%  Weight:      Height:        Intake/Output Summary (Last 24 hours) at 04/21/16 1507 Last data filed at 04/21/16 0300  Gross per 24 hour  Intake             1750 ml  Output                0 ml  Net             1750 ml   Filed Weights   04/20/16 0850  Weight: 67.1 kg (148 lb)    Examination:  General exam: Appears calm and comfortable    Respiratory system: Clear to auscultation. Respiratory effort normal. Cardiovascular system: S1 & S2 heard, RRR. No JVD, murmurs, rubs, gallops or clicks. No pedal edema. Gastrointestinal system: Abdomen is nondistended, soft and nontender. No organomegaly or masses felt. Normal bowel sounds heard. Central nervous system: Alert and oriented. No focal neurological deficits. Extremities: Symmetric 5 x 5 power. Skin: No rashes, lesions or ulcers Psychiatry: Judgement and insight appear normal. Mood & affect appropriate.     Data Reviewed: I have personally reviewed following labs and imaging studies  CBC:  Recent Labs Lab 04/20/16 1409  WBC 7.7  HGB 12.6  HCT 40.6  MCV 77.8*  PLT [id number]*   Basic Metabolic Panel:  Recent Labs Lab 04/20/16 1409 04/21/16 0550  NA 136 139  K 4.2 3.9  CL 99* 108  CO2 24 23  GLUCOSE 408* 34*  BUN 14 15  CREATININE 1.12* 1.10*  CALCIUM 10.0 8.6*   GFR: Estimated Creatinine Clearance: 56.2 mL/min (by C-G formula based on SCr of 1.1 mg/dL (H)). Liver Function Tests:  Recent Labs Lab 04/20/16 1409  AST 25  ALT 28  ALKPHOS 107  BILITOT 1.3*  PROT 9.0*  ALBUMIN 4.8    Recent Labs Lab 04/20/16 1409  LIPASE 52*   No results for input(s): AMMONIA in the last 168 hours. Coagulation Profile: No results for input(s): INR, PROTIME in the last 168 hours. Cardiac Enzymes: No results for input(s): CKTOTAL, CKMB, CKMBINDEX, TROPONINI in the last 168 hours. BNP (last 3 results) No results for input(s): PROBNP in the last 8760 hours. HbA1C: No results for input(s): HGBA1C in the last 72 hours. CBG:  Recent Labs Lab 04/20/16 1801 04/20/16 2134 04/21/16 0723 04/21/16 0737 04/21/16 1151  GLUCAP 349* 182* 28* 241* 200*   Lipid Profile: No results for input(s): CHOL, HDL, LDLCALC, TRIG, CHOLHDL, LDLDIRECT in the last 72 hours. Thyroid Function Tests: No results for input(s): TSH, T4TOTAL, FREET4, T3FREE, THYROIDAB in the last 72  hours. Anemia Panel: No results for input(s): VITAMINB12, FOLATE, FERRITIN, TIBC, IRON, RETICCTPCT in the last 72 hours. Sepsis Labs: No results for input(s): PROCALCITON, LATICACIDVEN in the last 168 hours.  No results found for this or any previous visit (from the past 240 hour(s)).       Radiology Studies: No results found.      Scheduled Meds: . busPIRone  10 mg Oral BID  . cloNIDine  0.1 mg Oral QHS  . enoxaparin (LOVENOX) injection  40 mg Subcutaneous Q24H  . erythromycin  200 mg Intravenous Q8H  . famotidine (PEPCID) IV  20 mg Intravenous Q12H  . insulin aspart  0-20 Units Subcutaneous TID WC  . insulin aspart  0-5 Units Subcutaneous QHS  . insulin detemir  25 Units Subcutaneous QHS  . metoCLOPramide (REGLAN) injection  10 mg Intravenous Q8H  . pregabalin  150 mg Oral BID  . senna-docusate  2 tablet Oral BID   Continuous Infusions:    LOS: 0 days    Time spent: 30 min    Uchechi Denison, MD [facility] Pager [phone]  If 7PM-7AM, please contact night-coverage [email or web address] Password [email or web address] 04/21/2016, 3:07 PM

## 2016-04-21 NOTE — Consult Note (Signed)
[facility] Consultation Note  Referring Provider: Dr. Hosie Poisson Primary Care Physician:  [name], MD  Reason for Consultation:  Nausea and vomiting  HPI: [name] is a 50 y.o. female admitted with nausea and vomiting.  Ongoing problem for years.  Endoscopic evaluation in past and prior GI evaluation by Drs. Derrill Kay and Deatra Ina.  Gastric emptying study has shown significantly delayed gastric emptying.  She has poorly controlled diabetes.  Has improved some since admission without recurrent vomiting.  No blood in stool.  Has chronic constipation.  No abdominal pain.   Past Medical History:  Diagnosis Date  . Depression   . Diabetes mellitus without complication ([place])   . Essential hypertension   . Gastroparesis   . GERD (gastroesophageal reflux disease)   . HLD (hyperlipidemia)     Past Surgical History:  Procedure Laterality Date  . COLONOSCOPY  09/27/2014   at [facility]  . ESOPHAGOGASTRODUODENOSCOPY  09/27/2014   at [facility], Dr Rolan Lipa. biospy neg for celiac, neg for H pylori.   . EYE SURGERY    . gailstones    . IR GENERIC HISTORICAL  01/24/2016   IR FLUORO GUIDE CV LINE RIGHT 01/24/2016 Darrell K Allred, PA-C [facility]  . IR GENERIC HISTORICAL  01/24/2016   IR US GUIDE VASC ACCESS RIGHT 01/24/2016 Darrell K Allred, PA-C [facility]  . POSTERIOR VITRECTOMY AND MEMBRANE PEEL-LEFT EYE  09/28/2002  . POSTERIOR VITRECTOMY AND MEMBRANE PEEL-RIGHT EYE  03/16/2002  . RETINAL DETACHMENT SURGERY      Prior to Admission medications   Medication Sig Start Date End Date Taking? Authorizing Provider  albuterol (PROVENTIL HFA;VENTOLIN HFA) 108 (90 Base) MCG/ACT inhaler Inhale 2 puffs into the lungs every 6 (six) hours as needed for wheezing or shortness of breath. 10/20/15  Yes [name], MD  busPIRone (BUSPAR) 10 MG tablet Take 1 tablet (10 mg total) by mouth 2 (two) times daily. 04/10/16  Yes [name], MD  cloNIDine (CATAPRES) 0.1 MG tablet Take 1 tablet (0.1 mg  total) by mouth at bedtime. For withdrawal symptoms 04/10/16  Yes [name], MD  docusate sodium (COLACE) 250 MG capsule Take 1 capsule (250 mg total) by mouth 2 (two) times daily. 03/26/16  Yes Donne Hazel, MD  ferrous sulfate 325 (65 FE) MG tablet Take 1 tablet (325 mg total) by mouth daily with breakfast. 03/27/16  Yes Donne Hazel, MD  furosemide (LASIX) 40 MG tablet Take 40 mg by mouth daily as needed for fluid.    Yes Historical Provider, MD  insulin aspart (NOVOLOG) 100 UNIT/ML injection Inject 0-15 Units into the skin 3 (three) times daily with meals. Per sliding scale Patient taking differently: Inject 4-15 Units into the skin 3 (three) times daily with meals. Per sliding scale 01/31/16  Yes [name], MD  insulin detemir (LEVEMIR) 100 UNIT/ML injection Inject 0.25 mLs (25 Units total) into the skin at bedtime. 04/10/16  Yes [name], MD  metoCLOPramide (REGLAN) 10 MG tablet Take 1 tablet (10 mg total) by mouth 3 (three) times daily before meals. 03/26/16  Yes Donne Hazel, MD  ondansetron (ZOFRAN ODT) 4 MG disintegrating tablet Take 1 tablet (4 mg total) by mouth every 8 (eight) hours as needed for nausea or vomiting. 04/10/16  Yes [name], MD  polyethylene glycol (MIRALAX / GLYCOLAX) packet Take 17 g by mouth daily as needed for mild constipation. 03/20/16  Yes Janece Canterbury, MD  pregabalin (LYRICA) 150 MG capsule Take 1 capsule (150 mg  total) by mouth 2 (two) times daily. 03/13/16  Yes [name], MD  Blood Glucose Monitoring Suppl (TRUE METRIX METER) DEVI 1 each by Does not apply route 3 (three) times daily before meals. 07/19/15   [name], MD  erythromycin (E-MYCIN) 250 MG tablet Take 1 tablet (250 mg total) by mouth 3 (three) times daily with meals as needed. Patient not taking: Reported on 04/20/2016 03/20/16   Janece Canterbury, MD  FLUoxetine (PROZAC) 20 MG tablet Take 1 tablet (20 mg total) by mouth daily. Patient not taking: Reported on 04/20/2016 04/10/16    [name], MD  glucose blood (TRUE METRIX BLOOD GLUCOSE TEST) test strip Use 3 times daily before meals 03/13/16   [name], MD  Insulin Syringe-Needle U-100 (BD INSULIN SYRINGE ULTRAFINE) 31G X 15/64" 0.5 ML MISC 1 each by Does not apply route 4 (four) times daily as needed. 08/16/15   [name], MD    Current Facility-Administered Medications  Medication Dose Route Frequency Provider Last Rate Last Dose  . albuterol (PROVENTIL) (2.5 MG/3ML) 0.083% nebulizer solution 2.5 mg  2.5 mg Nebulization Q6H PRN Hosie Poisson, MD      . busPIRone (BUSPAR) tablet 10 mg  10 mg Oral BID Hosie Poisson, MD   10 mg at 04/21/16 1024  . cloNIDine (CATAPRES) tablet 0.1 mg  0.1 mg Oral QHS Hosie Poisson, MD   0.1 mg at 04/20/16 2236  . enoxaparin (LOVENOX) injection 40 mg  40 mg Subcutaneous Q24H Hosie Poisson, MD   40 mg at 04/20/16 1956  . erythromycin 200 mg in sodium chloride 0.9 % 100 mL IVPB  200 mg Intravenous Q8H Hosie Poisson, MD   200 mg at 04/21/16 1523  . famotidine (PEPCID) IVPB 20 mg premix  20 mg Intravenous Q12H Hosie Poisson, MD   20 mg at 04/21/16 1024  . hydrALAZINE (APRESOLINE) injection 10 mg  10 mg Intravenous Q6H PRN Hosie Poisson, MD      . insulin aspart (novoLOG) injection 0-20 Units  0-20 Units Subcutaneous TID WC Hosie Poisson, MD   4 Units at 04/21/16 1241  . insulin aspart (novoLOG) injection 0-5 Units  0-5 Units Subcutaneous QHS Hosie Poisson, MD      . insulin detemir (LEVEMIR) injection 25 Units  25 Units Subcutaneous QHS Hosie Poisson, MD   25 Units at 04/20/16 2236  . magnesium hydroxide (MILK OF MAGNESIA) suspension 30 mL  30 mL Oral Daily PRN Hosie Poisson, MD      . metoCLOPramide (REGLAN) injection 10 mg  10 mg Intravenous Q8H Hosie Poisson, MD   10 mg at 04/21/16 1523  . ondansetron (ZOFRAN) injection 4 mg  4 mg Intravenous Q6H PRN Hosie Poisson, MD   4 mg at 04/20/16 1829  . pregabalin (LYRICA) capsule 150 mg  150 mg Oral BID Hosie Poisson, MD   150 mg at 04/21/16 1024  .  senna-docusate (Senokot-S) tablet 2 tablet  2 tablet Oral BID Hosie Poisson, MD   2 tablet at 04/21/16 1523    Allergies as of 04/20/2016 - Review Complete 04/20/2016  Allergen Reaction Noted  . Anesthetics, amide Nausea And Vomiting 03/21/2012  . Penicillins Diarrhea and Nausea And Vomiting 10/20/2014  . Buprenorphine hcl Rash 02/08/2015  . Encainide Nausea And Vomiting 02/08/2015    Family History  Problem Relation Age of Onset  . Cystic fibrosis Mother   . Hypertension Father   . Diabetes Brother   . Hypertension Maternal Grandmother     Social History  Social History  . Marital status: Single    Spouse name: N/A  . Number of children: N/A  . Years of education: N/A   Occupational History  . Not on file.   Social History Main Topics  . Smoking status: Never Smoker  . Smokeless tobacco: Never Used  . Alcohol use No  . Drug use: No  . Sexual activity: Not Currently   Other Topics Concern  . Not on file   Social History Narrative  . No narrative on file    Review of Systems: As per HPI, all others negative  Physical Exam: Vital signs in last 24 hours: Temp:  [97.3 F (36.3 C)-99.3 F (37.4 C)] 98.1 F (36.7 C) (10/28 1411) Pulse Rate:  [77-107] 81 (10/28 1411) Resp:  [18-20] 18 (10/28 1411) BP: (103-166)/(49-104) 103/51 (10/28 1411) SpO2:  [97 %-100 %] 97 % (10/28 1411) Last BM Date: 04/17/16 (per pt) General:   Alert,  Well-developed, well-nourished, pleasant and cooperative in NAD Head:  Normocephalic and atraumatic. Eyes:  Sclera clear, no icterus.   Conjunctiva pink. Ears:  Normal auditory acuity. Nose:  No deformity, discharge,  or lesions. Mouth:  No deformity or lesions.  Oropharynx pink & moist. Neck:  Supple; no masses or thyromegaly. Lungs:  Clear throughout to auscultation.   No wheezes, crackles, or rhonchi. No acute distress. Heart:  Regular rate and rhythm; no murmurs, clicks, rubs,  or gallops. Abdomen:  Soft, nontender and  nondistended. No masses, hepatosplenomegaly or hernias noted. Normal bowel sounds, without guarding, and without rebound.     Msk:  Symmetrical without gross deformities. Normal posture. Pulses:  Normal pulses noted. Extremities:  Without clubbing or edema. Neurologic:  Alert and  oriented x4; diffusely weak, otherwise grossly normal neurologically. Skin:  Intact without significant lesions or rashes. Cervical Nodes:  No significant cervical adenopathy. Psych:  Alert and cooperative. Depressed mood, flat affect   Lab Results:  Recent Labs  04/20/16 1409  WBC 7.7  HGB 12.6  HCT 40.6  PLT 462*   BMET  Recent Labs  04/20/16 1409 04/21/16 0550  NA 136 139  K 4.2 3.9  CL 99* 108  CO2 24 23  GLUCOSE 408* 34*  BUN 14 15  CREATININE 1.12* 1.10*  CALCIUM 10.0 8.6*   LFT  Recent Labs  04/20/16 1409  PROT 9.0*  ALBUMIN 4.8  AST 25  ALT 28  ALKPHOS 107  BILITOT 1.3*   PT/INR No results for input(s): LABPROT, INR in the last 72 hours.  Studies/Results: No results found.  Impression:  1.  Nausea and vomiting, improving. 2.  Diabetic gastroparesis. 3.  Poorly controlled diabetes  Plan:  1.  Advance diet as tolerated. 2.  Small, frequent meals; gastroparesis-type diet. 3.  Continue erythromycin and metoclopramide for now; would like to limit metoclopramide use if possible given concerns for tardive dyskinesia side-effect. 4.  Will follow.    LOS: 0 days   Capricia Serda M  04/21/2016, 5:01 PM  Pager [phone] If no answer or after 5 PM call [phone]

## 2016-04-22 LAB — GLUCOSE, CAPILLARY
GLUCOSE-CAPILLARY: 72 mg/dL (ref 65–99)
Glucose-Capillary: 81 mg/dL (ref 65–99)
Glucose-Capillary: 295 mg/dL — ABNORMAL HIGH (ref 65–99)
Glucose-Capillary: 210 mg/dL — ABNORMAL HIGH (ref 65–99)

## 2016-04-22 MED ORDER — INSULIN DETEMIR 100 UNIT/ML ~~LOC~~ SOLN
20.0000 [IU] | Freq: Every day | SUBCUTANEOUS | Status: DC
  Administered 2016-04-22: 20 [IU] via SUBCUTANEOUS
  Filled 2016-04-22 (×2): qty 0.2

## 2016-04-22 MED ORDER — POLYETHYLENE GLYCOL 3350 17 G PO PACK
17.0000 g | PACK | Freq: Two times a day (BID) | ORAL | Status: DC
  Administered 2016-04-22 – 2016-04-23 (×2): 17 g via ORAL
  Filled 2016-04-22 (×2): qty 1

## 2016-04-22 MED ORDER — ONDANSETRON 4 MG PO TBDP: 4.0000 mg | ORAL_TABLET | Freq: Three times a day (TID) | ORAL | Status: DC | PRN

## 2016-04-22 MED ORDER — BISACODYL 10 MG RE SUPP
10.0000 mg | Freq: Once | RECTAL | Status: AC
  Administered 2016-04-22: 10 mg via RECTAL
  Filled 2016-04-22: qty 1

## 2016-04-22 MED ORDER — ERYTHROMYCIN ETHYLSUCCINATE 200 MG/5ML PO SUSR
200.0000 mg | Freq: Three times a day (TID) | ORAL | Status: DC
  Administered 2016-04-22 – 2016-04-23 (×4): 200 mg via ORAL
  Filled 2016-04-22 (×4): qty 5

## 2016-04-22 MED ORDER — PANTOPRAZOLE SODIUM 40 MG PO TBEC
40.0000 mg | DELAYED_RELEASE_TABLET | Freq: Every day | ORAL | Status: DC
  Administered 2016-04-22 – 2016-04-23 (×2): 40 mg via ORAL
  Filled 2016-04-22 (×2): qty 1

## 2016-04-22 NOTE — Progress Notes (Signed)
PROGRESS NOTE    [name]  [id number] DOB: [birth year] DOA: 04/20/2016 PCP: [name], MD    Brief Narrative: [name] is a 50 y.o. female with medical history significant of gastroparesis, uncontrolled diabetes mellitus, depression, hypertension, GERD, hyperlipidemia, presents with nausea, vomiting and abdo pain since 3 days prior to admission, she reports the symptoms are similar to the gastroparesis she has intermittently.    Assessment & Plan:   Active Problems:   Gastroparesis   Worsening gastroparesis:  Improving with symptomatic management with IV anti emetics, iv REGLAN  And IV erythromycin.  Gi consulted for recommendations for recurrent admissions. Change to po, as her symptoms are improving. Small portions of diet , multiple times.  Advance diet as tolerated.    Diabetes Mellitus: CBG (last 3)   Recent Labs  04/21/16 2033 04/22/16 0724 04/22/16 1206  GLUCAP 187* 81 72    Resume long acting and ssi. Decreased the dose of levemir to 20 units daily.  cbgs well controlled.    Hypertension:  Controlled.    GERD: protonix daily.     DVT prophylaxis: (Lovenox) Code Status: (Full Family Communication: none at bedside.  Disposition Plan: home in am.   Consultants:   gastroenterology   Procedures:  None.    Antimicrobials: none.    Subjective: Able toeat without nausea or vomiting.   Objective: Vitals:   04/21/16 1411 04/21/16 2037 04/22/16 0536 04/22/16 1345  BP: (!) 103/51 116/65 131/64 138/74  Pulse: 81 76 77 80  Resp: 18 17 18 18   Temp: 98.1 F (36.7 C) 98.5 F (36.9 C) 98.5 F (36.9 C) 98.1 F (36.7 C)  TempSrc: Oral Oral Oral Oral  SpO2: 97% 98% 99% 100%  Weight:      Height:        Intake/Output Summary (Last 24 hours) at 04/22/16 1642 Last data filed at 04/22/16 0931  Gross per 24 hour  Intake              660 ml  Output                0 ml  Net              660 ml   Filed Weights   04/20/16 0850    Weight: 67.1 kg (148 lb)    Examination:  General exam: Appears calm and comfortable  Respiratory system: Clear to auscultation. Respiratory effort normal. Cardiovascular system: S1 & S2 heard, RRR. No JVD, murmurs, rubs, gallops or clicks. No pedal edema. Gastrointestinal system: Abdomen is nondistended, soft and nontender. No organomegaly or masses felt. Normal bowel sounds heard. Central nervous system: Alert and oriented. No focal neurological deficits. Extremities: Symmetric 5 x 5 power. Skin: No rashes, lesions or ulcers Psychiatry: Judgement and insight appear normal. Mood & affect appropriate.     Data Reviewed: I have personally reviewed following labs and imaging studies  CBC:  Recent Labs Lab 04/20/16 1409  WBC 7.7  HGB 12.6  HCT 40.6  MCV 77.8*  PLT [id number]*   Basic Metabolic Panel:  Recent Labs Lab 04/20/16 1409 04/21/16 0550  NA 136 139  K 4.2 3.9  CL 99* 108  CO2 24 23  GLUCOSE 408* 34*  BUN 14 15  CREATININE 1.12* 1.10*  CALCIUM 10.0 8.6*   GFR: Estimated Creatinine Clearance: 56.2 mL/min (by C-G formula based on SCr of 1.1 mg/dL (H)). Liver Function Tests:  Recent Labs Lab 04/20/16 1409  AST 25  ALT 28  ALKPHOS 107  BILITOT 1.3*  PROT 9.0*  ALBUMIN 4.8    Recent Labs Lab 04/20/16 1409  LIPASE 52*   No results for input(s): AMMONIA in the last 168 hours. Coagulation Profile: No results for input(s): INR, PROTIME in the last 168 hours. Cardiac Enzymes: No results for input(s): CKTOTAL, CKMB, CKMBINDEX, TROPONINI in the last 168 hours. BNP (last 3 results) No results for input(s): PROBNP in the last 8760 hours. HbA1C: No results for input(s): HGBA1C in the last 72 hours. CBG:  Recent Labs Lab 04/21/16 1151 04/21/16 1819 04/21/16 2033 04/22/16 0724 04/22/16 1206  GLUCAP 200* 104* 187* 81 72   Lipid Profile: No results for input(s): CHOL, HDL, LDLCALC, TRIG, CHOLHDL, LDLDIRECT in the last 72 hours. Thyroid Function  Tests: No results for input(s): TSH, T4TOTAL, FREET4, T3FREE, THYROIDAB in the last 72 hours. Anemia Panel: No results for input(s): VITAMINB12, FOLATE, FERRITIN, TIBC, IRON, RETICCTPCT in the last 72 hours. Sepsis Labs: No results for input(s): PROCALCITON, LATICACIDVEN in the last 168 hours.  No results found for this or any previous visit (from the past 240 hour(s)).       Radiology Studies: No results found.      Scheduled Meds: . busPIRone  10 mg Oral BID  . cloNIDine  0.1 mg Oral QHS  . enoxaparin (LOVENOX) injection  40 mg Subcutaneous Q24H  . erythromycin ethylsuccinate  200 mg Oral Q8H  . insulin aspart  0-20 Units Subcutaneous TID WC  . insulin aspart  0-5 Units Subcutaneous QHS  . insulin detemir  25 Units Subcutaneous QHS  . pantoprazole  40 mg Oral Daily  . polyethylene glycol  17 g Oral BID  . pregabalin  150 mg Oral BID   Continuous Infusions:    LOS: 1 day    Time spent: 57 min    Yaser Harvill, MD [facility] Pager [phone]  If 7PM-7AM, please contact night-coverage [email or web address] Password [email or web address] 04/22/2016, 4:42 PM

## 2016-04-22 NOTE — Progress Notes (Signed)
Subjective: Feels bloated and constipated. No abdominal pain. Tolerating current diet.  Objective: Vital signs in last 24 hours: Temp:  [98.1 F (36.7 C)-98.5 F (36.9 C)] 98.5 F (36.9 C) (10/29 0536) Pulse Rate:  [76-81] 77 (10/29 0536) Resp:  [17-18] 18 (10/29 0536) BP: (103-131)/(51-65) 131/64 (10/29 0536) SpO2:  [97 %-99 %] 99 % (10/29 0536) Weight change:  Last BM Date: 04/21/16  PE: GEN:  NAD, pleasant ABD:  Protuberant, mild distended, non-tender  Lab Results: CBC    Component Value Date/Time   WBC 7.7 04/20/2016 1409   RBC 5.22 (H) 04/20/2016 1409   HGB 12.6 04/20/2016 1409   HCT 40.6 04/20/2016 1409   HCT REJ5 03/23/2016 0210   PLT 462 (H) 04/20/2016 1409   MCV 77.8 (L) 04/20/2016 1409   MCH 24.1 (L) 04/20/2016 1409   MCHC 31.0 04/20/2016 1409   RDW 22.1 (H) 04/20/2016 1409   LYMPHSABS 1.2 04/06/2016 1000   MONOABS 0.5 04/06/2016 1000   EOSABS 0.0 04/06/2016 1000   BASOSABS 0.0 04/06/2016 1000   CMP     Component Value Date/Time   NA 139 04/21/2016 0550   K 3.9 04/21/2016 0550   CL 108 04/21/2016 0550   CO2 23 04/21/2016 0550   GLUCOSE 34 (LL) 04/21/2016 0550   BUN 15 04/21/2016 0550   CREATININE 1.10 (H) 04/21/2016 0550   CALCIUM 8.6 (L) 04/21/2016 0550   PROT 9.0 (H) 04/20/2016 1409   ALBUMIN 4.8 04/20/2016 1409   AST 25 04/20/2016 1409   ALT 28 04/20/2016 1409   ALKPHOS 107 04/20/2016 1409   BILITOT 1.3 (H) 04/20/2016 1409   GFRNONAA 58 (L) 04/21/2016 0550   GFRAA >60 04/21/2016 0550   Assessment:  1.  Nausea and vomiting, resolved. 2.  Diabetic gastroparesis, with documented significantly delayed gastric emptying. 3.  Brittle diabetes. 4.  Constipation, chronic.  Plan:  1.  Gastroparesis-type diet (small, frequent meals; avoid/minimize raw fruits/vegetables, rich foods, hard-crusted breads). 2.  Stop metoclopramide; concern about tardive dyskinesia. 3.  Transition erythromycin to oral suspension, 200 mg po TID, and continue upon  discharge and until further notice. 4.  Ondansetron prn for breakthrough nausea. 5.  Strict diabetic control is key, and patient and I had lengthy discussion about this today. 6.  Patient wants to go home today; I would advise tomorrow, just to see if transitioning to oral therapies is sufficient to control her nausea and vomiting, but if patient insists then I would send her home. 7.  Dulcolax suppository x 1, then Miralax 17 g po bid indefinitely upon discharge. 8.  Follow-up with me (Dr. [facility], [phone]) in 1-2 weeks upon hospital discharge. 9.  [facility] will sign-off; please call with questions; thank you for the consultation.   [name] 04/22/2016, 10:05 AM   Pager [phone] If no answer or after 5 PM call [phone]

## 2016-04-23 ENCOUNTER — Telehealth: Payer: Self-pay

## 2016-04-23 LAB — GLUCOSE, CAPILLARY
GLUCOSE-CAPILLARY: 111 mg/dL — AB (ref 65–99)
GLUCOSE-CAPILLARY: 188 mg/dL — AB (ref 65–99)

## 2016-04-23 MED ORDER — INSULIN DETEMIR 100 UNIT/ML ~~LOC~~ SOLN
20.0000 [IU] | Freq: Every day | SUBCUTANEOUS | 11 refills | Status: DC
Start: 2016-04-23 — End: 2016-05-09

## 2016-04-23 MED ORDER — PANTOPRAZOLE SODIUM 40 MG PO TBEC: 40.0000 mg | DELAYED_RELEASE_TABLET | Freq: Every day | ORAL | 0 refills | Status: DC

## 2016-04-23 NOTE — Discharge Summary (Signed)
Physician Discharge Summary  [name] [id number] DOB: 1965-10-17 DOA: 04/20/2016  PCP: Yvette Morale, MD  Admit date: 04/20/2016 Discharge date: 04/23/2016  Admitted From: Home  Disposition:  Home.   Recommendations for Outpatient Follow-up:  1. Follow up with PCP in 1-2 weeks 2. Please obtain BMP/CBC in one week 3. Please follow up with Yvette [name] as recommended.     Discharge Condition:stable.  CODE STATUS:full code.  Diet recommendation: Heart Healthy / Carb Modified /  Brief/Interim Summary: Yvette Thomasis a 50 y.o.femalewith medical history significant of gastroparesis, uncontrolled diabetes mellitus, depression, hypertension, GERD, hyperlipidemia, presents with nausea, vomiting and abdo pain since 3 days prior to admission, she reports the symptoms are similar to the gastroparesis she has intermittently.   Discharge Diagnoses:  Active Problems:   Gastroparesis  Worsening gastroparesis:  Improving with symptomatic management with IV anti emetics, iv REGLAN  And IV erythromycin.  Gi consulted for recommendations for recurrent admissions. Change medications  to po, as her symptoms are improving. Small portions of diet , multiple times.  Advanced diet as tolerated. No nausea, vomiting and abd pain.    Diabetes Mellitus: CBG (last 3)   Recent Labs (last 2 labs)    Recent Labs  04/21/16 2033 04/22/16 0724 04/22/16 1206  GLUCAP 187* 81 72      Resume long acting and ssi. Decreased the dose of levemir to 20 units daily.  cbgs well controlled.    Hypertension:  Controlled.    GERD: protonix daily.     Discharge Instructions  Discharge Instructions    Diet Carb Modified    Complete by:  As directed    Discharge instructions    Complete by:  As directed    Please follow up with Yvette [name] as recommended.       Medication List    TAKE these medications   albuterol 108 (90 Base) MCG/ACT inhaler Commonly known as:  PROVENTIL  HFA;VENTOLIN HFA Inhale 2 puffs into the lungs every 6 (six) hours as needed for wheezing or shortness of breath.   busPIRone 10 MG tablet Commonly known as:  BUSPAR Take 1 tablet (10 mg total) by mouth 2 (two) times daily.   cloNIDine 0.1 MG tablet Commonly known as:  CATAPRES Take 1 tablet (0.1 mg total) by mouth at bedtime. For withdrawal symptoms   docusate sodium 250 MG capsule Commonly known as:  COLACE Take 1 capsule (250 mg total) by mouth 2 (two) times daily.   erythromycin 250 MG tablet Commonly known as:  E-MYCIN Take 1 tablet (250 mg total) by mouth 3 (three) times daily with meals as needed.   ferrous sulfate 325 (65 FE) MG tablet Take 1 tablet (325 mg total) by mouth daily with breakfast.   FLUoxetine 20 MG tablet Commonly known as:  PROZAC Take 1 tablet (20 mg total) by mouth daily.   furosemide 40 MG tablet Commonly known as:  LASIX Take 40 mg by mouth daily as needed for fluid.   glucose blood test strip Commonly known as:  TRUE METRIX BLOOD GLUCOSE TEST Use 3 times daily before meals   insulin aspart 100 UNIT/ML injection Commonly known as:  novoLOG Inject 0-15 Units into the skin 3 (three) times daily with meals. Per sliding scale What changed:  how much to take  additional instructions   insulin detemir 100 UNIT/ML injection Commonly known as:  LEVEMIR Inject 0.2 mLs (20 Units total) into the skin at bedtime. What changed:  how much to take  Insulin Syringe-Needle U-100 31G X 15/64" 0.5 ML Misc Commonly known as:  BD INSULIN SYRINGE ULTRAFINE 1 each by Does not apply route 4 (four) times daily as needed.   metoCLOPramide 10 MG tablet Commonly known as:  REGLAN Take 1 tablet (10 mg total) by mouth 3 (three) times daily before meals.   ondansetron 4 MG disintegrating tablet Commonly known as:  ZOFRAN ODT Take 1 tablet (4 mg total) by mouth every 8 (eight) hours as needed for nausea or vomiting.   pantoprazole 40 MG tablet Commonly known  as:  PROTONIX Take 1 tablet (40 mg total) by mouth daily. Start taking on:  04/24/2016   polyethylene glycol packet Commonly known as:  MIRALAX / GLYCOLAX Take 17 g by mouth daily as needed for mild constipation.   pregabalin 150 MG capsule Commonly known as:  LYRICA Take 1 capsule (150 mg total) by mouth 2 (two) times daily.   TRUE METRIX METER Devi 1 each by Does not apply route 3 (three) times daily before meals.      Follow-up [name]. Go on 04/30/2016.   Specialty:  Internal Medicine Why:  at 2:00pm for an appointment with [name] in the [facility].  Contact information: [id number] [place] [address] [phone]         Allergies  Allergen Reactions  . Anesthetics, Amide Nausea And Vomiting  . Penicillins Diarrhea and Nausea And Vomiting    Has patient had a PCN reaction causing immediate rash, facial/tongue/throat swelling, SOB or lightheadedness with hypotension: No Has patient had a PCN reaction causing severe rash involving mucus membranes or skin necrosis: No Has patient had a PCN reaction that required hospitalization No Has patient had a PCN reaction occurring within the last 10 years: Yes  If all of the above answers are "NO", then may proceed with Cephalosporin use.   . Buprenorphine Hcl Rash  . Encainide Nausea And Vomiting    Consultations:  gastroenterology   Procedures/Studies:  No results found. none  Subjective: No nausea, vomtiing and abdominal pain.  Discharge Exam: Vitals:   04/22/16 2114 04/23/16 0606  BP: (!) 120/55 (!) 141/72  Pulse: 78 74  Resp: 18 18  Temp: 98.2 F (36.8 C) 98 F (36.7 C)   Vitals:   04/22/16 0536 04/22/16 1345 04/22/16 2114 04/23/16 0606  BP: 131/64 138/74 (!) 120/55 (!) 141/72  Pulse: 77 80 78 74  Resp: 18 18 18 18   Temp: 98.5 F (36.9 C) 98.1 F (36.7 C) 98.2 F (36.8 C) 98 F (36.7 C)   TempSrc: Oral Oral Oral Oral  SpO2: 99% 100% 100% 98%  Weight:      Height:        General: Pt is alert, awake, not in acute distress Cardiovascular: RRR, S1/S2 +, no rubs, no gallops Respiratory: CTA bilaterally, no wheezing, no rhonchi Abdominal: Soft, NT, ND, bowel sounds + Extremities: no edema, no cyanosis    The results of significant diagnostics from this hospitalization (including imaging, microbiology, ancillary and laboratory) are listed below for reference.     Microbiology: No results found for this or any previous visit (from the past 240 hour(s)).   Labs: BNP (last 3 results) No results for input(s): BNP in the last 8760 hours. Basic Metabolic Panel:  Recent Labs Lab 04/20/16 1409 04/21/16 0550  NA 136 139  K 4.2 3.9  CL 99* 108  CO2 24 23  GLUCOSE 408* 34*  BUN 14 15  CREATININE 1.12* 1.10*  CALCIUM 10.0 8.6*   Liver Function Tests:  Recent Labs Lab 04/20/16 1409  AST 25  ALT 28  ALKPHOS 107  BILITOT 1.3*  PROT 9.0*  ALBUMIN 4.8    Recent Labs Lab 04/20/16 1409  LIPASE 52*   No results for input(s): AMMONIA in the last 168 hours. CBC:  Recent Labs Lab 04/20/16 1409  WBC 7.7  HGB 12.6  HCT 40.6  MCV 77.8*  PLT 462*   Cardiac Enzymes: No results for input(s): CKTOTAL, CKMB, CKMBINDEX, TROPONINI in the last 168 hours. BNP: Invalid input(s): POCBNP CBG:  Recent Labs Lab 04/22/16 1206 04/22/16 1740 04/22/16 2043 04/23/16 0714 04/23/16 1149  GLUCAP 72 295* 210* 111* 188*   D-Dimer No results for input(s): DDIMER in the last 72 hours. Hgb A1c No results for input(s): HGBA1C in the last 72 hours. Lipid Profile No results for input(s): CHOL, HDL, LDLCALC, TRIG, CHOLHDL, LDLDIRECT in the last 72 hours. Thyroid function studies No results for input(s): TSH, T4TOTAL, T3FREE, THYROIDAB in the last 72 hours.  Invalid input(s): FREET3 Anemia work up No results for input(s): VITAMINB12, FOLATE, FERRITIN, TIBC, IRON,  RETICCTPCT in the last 72 hours. Urinalysis    Component Value Date/Time   COLORURINE YELLOW 04/20/2016 [place] 04/20/2016 1355   LABSPEC 1.029 04/20/2016 1355   PHURINE 6.0 04/20/2016 1355   GLUCOSEU >1000 (A) 04/20/2016 1355   HGBUR TRACE (A) 04/20/2016 1355   BILIRUBINUR NEGATIVE 04/20/2016 1355   BILIRUBINUR NEGATIVE 08/04/2012 1005   KETONESUR 40 (A) 04/20/2016 1355   PROTEINUR 100 (A) 04/20/2016 1355   UROBILINOGEN 0.2 04/27/2015 1608   NITRITE NEGATIVE 04/20/2016 1355   LEUKOCYTESUR NEGATIVE 04/20/2016 1355   Sepsis Labs Invalid input(s): PROCALCITONIN,  WBC,  LACTICIDVEN Microbiology No results found for this or any previous visit (from the past 240 hour(s)).   Time coordinating discharge: Over 30 minutes  SIGNED:   Hosie Poisson, MD  [facility] 04/23/2016, 9:38 PM Pager   If 7PM-7AM, please contact night-coverage [email or web address] Password [email or web address]

## 2016-04-23 NOTE — Telephone Encounter (Signed)
Call placed to Velva Harman, RN CM informing her that the patient is still not eligible for an [employer] as she has to address the issue of her name being on her son's money market account. Also notified her that the cost for the erythromycin is approximately $1000 as per [place] and again the patient has no insurance for medication coverage.  Inquired if there is possibly an alternative for the patient. Also, as far as recommended follow up at [place], is it possible for the patient to follow up with GI if she doesn't have insurance. Leander Rams, RN CM noted that she will leave a message for the doctor about the questions just discussed.

## 2016-04-23 NOTE — Progress Notes (Signed)
Patient given discharge instructions, and verbalized an understanding of all discharge instructions.  Patient agrees with discharge plan, and is being discharged in stable medical condition.  

## 2016-04-23 NOTE — Telephone Encounter (Signed)
Call placed to the patient and she was still hospitalized. She said that she is waiting for a doctor to complete her discharge order and she is planning to go home today. She has an appointment scheduled at [facility] tomorrow - 04/24/16 but she requested that the appointment be re-scheduled.  The appointment has been rescheduled for 04/30/16 @ 1400 and the information has been placed on the AVS.

## 2016-04-24 ENCOUNTER — Ambulatory Visit: Payer: Self-pay | Admitting: Family Medicine

## 2016-04-24 ENCOUNTER — Telehealth: Payer: Self-pay

## 2016-04-24 NOTE — Telephone Encounter (Signed)
[facility] Post-discharge Follow-Up Phone Call:  Date of Discharge: 04/23/2016 Principal Discharge Diagnosis(es): gastroparesis, DM,HTN Post-discharge Communication: (Clearly document all attempts clearly and date contact made) Call placed to # [phone] (M) and a HIPAA compliant voice mail message was left requesting a call back to # [phone] or [phone].  Call Completed: No

## 2016-04-25 ENCOUNTER — Telehealth: Payer: Self-pay

## 2016-04-25 NOTE — Telephone Encounter (Signed)
[facility]itional [facility] Clinic Post-discharge Follow-Up Phone Call:  Attempt # 2  Date of Discharge: 04/23/2016 Principal Discharge Diagnosis(es): gastroparesis, DM Post-discharge Communication: (Clearly document all attempts clearly and date contact made) Call placed to # [phone] (M) and a HIPAA compliant voicemail message was left requesting a call back to # [phone] or [phone].  Call Completed: No

## 2016-04-26 ENCOUNTER — Telehealth: Payer: Self-pay

## 2016-04-26 NOTE — Telephone Encounter (Signed)
Trans[facility] Clinic Post-discharge Follow-Up Phone Call:  Attempt #3  Date of Discharge:04/23/16 Principal Discharge Diagnosis(es): Gastroparesis Post-discharge Communication: Call placed to [phone]; unable to reach patient. HIPPA compliant voicemail left requesting return call. Call Completed: No

## 2016-04-27 ENCOUNTER — Telehealth: Payer: Self-pay

## 2016-04-27 NOTE — Telephone Encounter (Signed)
[facility] Post-discharge Follow-Up Phone Call:  Attempt #4  Date of Discharge: 04/23/16 Principal Discharge Diagnosis(es): gastroparesis Post-discharge Communication: Call placed to [phone] to complete post-discharge follow-up phone call; however, unable to reach patient. HIPPA compliant voicemail left requesting return call. Call completed: No

## 2016-04-30 ENCOUNTER — Telehealth: Payer: Self-pay

## 2016-04-30 ENCOUNTER — Inpatient Hospital Stay: Payer: Self-pay | Admitting: Family Medicine

## 2016-04-30 NOTE — Telephone Encounter (Addendum)
Transitional Care Clinic Post-discharge Follow-Up Phone Call:  Date of Discharge: 04/23/2016 Principal Discharge Diagnosis(es): gastroparesis, DM Post-discharge Communication: (Clearly document all attempts clearly and date contact made) call placed to the patient to confirm her appointment for today. Call Completed: Yes                    With Whom: patient Interpreter Needed: No     Please check all that apply:  X Patient is knowledgeable of his/her condition(s) and/or treatment. X  Patient is caring for self at home.  - She lives with her sister but she states that her sister is always working. She also receives some assistance from her son.  ? Patient is receiving assist at home from family and/or caregiver. Family and/or caregiver is knowledgeable of patient's condition(s) and/or treatment. ? Patient is receiving home health services. If so, name of agency.     Medication Reconciliation:  ? Medication list reviewed with patient. X  Patient obtained all discharge medications. If not, why?  She said that she has everything except the pantoprazole, she needs to have the prescription filled. She also does not have the erythromycin due to the cost and she does not have insurance  She said that she did not want to review the medication list. She reported that her blood sugar was 135 this morning and has been in the 100's with some 200's; but nothing over 200.    Activities of Daily Living:  X Independent ? Needs assist (describe; ? home DME used) ? [facility] (describe, ? home DME used)   Community resources in place for patient:  X None  ? Home Health/Home DME ? Assisted Living ? Support Group          Patient Education: She reported that she still needs to have her name removed from her son's money market account.   She said that she is " not feeing too good."  She stated that she has not experienced any vomiting but has episodes of occasional nausea. She noted that she  understands when she should go to the ED for medical attention.   Reminded her that she has an appointment with Dr Jarold Song in the [place] today at 1400 and she stated that she does not feel like coming to the clinic and would like to re-schedule. This CM offered her cab transportation to the clinic but she declined and stated that she would prefer to re-schedule.  An appointment was scheduled for 05/09/16 @1400 .         Questions/Concerns discussed: No other questions/concerns reported at this time.   Message with update sent to Dr Jarold Song

## 2016-05-08 ENCOUNTER — Telehealth: Payer: Self-pay

## 2016-05-08 NOTE — Telephone Encounter (Signed)
Call placed to the patient and confirmed her appointment for tomorrow - 05/09/16 @ 1400 at [facility]. Reminded her to bring her medications with her and she stated that she understood.  She noted that she will need transportation to the clinic.  Jacklynn Lewis, [facility] scheduler notified of the need for a cab.  The patient stated that she is feeling " okay."  She reported occasional episodes of nausea, no vomiting,  that have been relieved with zofran.  She reported that her blood sugars have been running in the low 200's - 220-230.  No other concerns/ questions reported at this time.

## 2016-05-09 ENCOUNTER — Encounter: Payer: Self-pay | Admitting: Family Medicine

## 2016-05-09 ENCOUNTER — Ambulatory Visit: Payer: Self-pay | Attending: Family Medicine | Admitting: Family Medicine

## 2016-05-09 VITALS — BP 123/72 | HR 88 | Temp 98.2°F | Ht 62.0 in | Wt 140.8 lb

## 2016-05-09 DIAGNOSIS — Z9889 Other specified postprocedural states: Secondary | ICD-10-CM | POA: Insufficient documentation

## 2016-05-09 DIAGNOSIS — D649 Anemia, unspecified: Secondary | ICD-10-CM | POA: Insufficient documentation

## 2016-05-09 DIAGNOSIS — I10 Essential (primary) hypertension: Secondary | ICD-10-CM | POA: Insufficient documentation

## 2016-05-09 DIAGNOSIS — F418 Other specified anxiety disorders: Secondary | ICD-10-CM

## 2016-05-09 DIAGNOSIS — Z880 Allergy status to penicillin: Secondary | ICD-10-CM | POA: Insufficient documentation

## 2016-05-09 DIAGNOSIS — K3184 Gastroparesis: Secondary | ICD-10-CM | POA: Insufficient documentation

## 2016-05-09 DIAGNOSIS — E1065 Type 1 diabetes mellitus with hyperglycemia: Secondary | ICD-10-CM | POA: Insufficient documentation

## 2016-05-09 DIAGNOSIS — F419 Anxiety disorder, unspecified: Secondary | ICD-10-CM | POA: Insufficient documentation

## 2016-05-09 DIAGNOSIS — Z794 Long term (current) use of insulin: Secondary | ICD-10-CM | POA: Insufficient documentation

## 2016-05-09 DIAGNOSIS — F329 Major depressive disorder, single episode, unspecified: Secondary | ICD-10-CM | POA: Insufficient documentation

## 2016-05-09 DIAGNOSIS — F32A Depression, unspecified: Secondary | ICD-10-CM

## 2016-05-09 DIAGNOSIS — Z79899 Other long term (current) drug therapy: Secondary | ICD-10-CM | POA: Insufficient documentation

## 2016-05-09 DIAGNOSIS — E1010 Type 1 diabetes mellitus with ketoacidosis without coma: Secondary | ICD-10-CM

## 2016-05-09 DIAGNOSIS — Z0001 Encounter for general adult medical examination with abnormal findings: Secondary | ICD-10-CM | POA: Insufficient documentation

## 2016-05-09 LAB — GLUCOSE, POCT (MANUAL RESULT ENTRY): POC Glucose: 159 mg/dl — AB (ref 70–99)

## 2016-05-09 MED ORDER — PANTOPRAZOLE SODIUM 40 MG PO TBEC: 40.0000 mg | DELAYED_RELEASE_TABLET | Freq: Every day | ORAL | 3 refills | Status: DC

## 2016-05-09 MED ORDER — INSULIN ASPART 100 UNIT/ML ~~LOC~~ SOLN: 0.0000 [IU] | Freq: Three times a day (TID) | SUBCUTANEOUS | 3 refills | Status: DC

## 2016-05-09 MED ORDER — INSULIN DETEMIR 100 UNIT/ML ~~LOC~~ SOLN: 20.0000 [IU] | Freq: Every day | SUBCUTANEOUS | 11 refills | Status: DC

## 2016-05-09 MED FILL — !NOVOLOG 100UNITS/ML VIAL: 100/ML | 22 days supply | Qty: 10 | Fill #0

## 2016-05-09 MED FILL — ?PANTOPRAZOLE SOD DR 40MG: 40 MG | 30 days supply | Qty: 30 | Fill #0

## 2016-05-09 MED FILL — busPIRone HCL 10 MG TABS: 10 | 30 days supply | Qty: 60 | Fill #1

## 2016-05-09 MED FILL — !LEVEMIR 100 UNITS/ML VIAL: 100/ML | 30 days supply | Qty: 10 | Fill #0

## 2016-05-09 NOTE — Patient Instructions (Signed)
° °Gastroparesis °Introduction °Gastroparesis, also called delayed gastric emptying, is a condition in which food takes longer than normal to empty from the stomach. The condition is usually long-lasting (chronic). °What are the causes? °This condition may be caused by: °· An endocrine disorder, such as hypothyroidism or diabetes. Diabetes is the most common cause of this condition. °· A nervous system disease, such as Parkinson disease or multiple sclerosis. °· Cancer, infection, or surgery of the stomach or vagus nerve. °· A connective tissue disorder, such as scleroderma. °· Certain medicines. °In most cases, the cause is not known. °What increases the risk? °This condition is more likely to develop in: °· People with certain disorders, including endocrine disorders, eating disorders, amyloidosis, and scleroderma. °· People with certain diseases, including Parkinson disease or multiple sclerosis. °· People with cancer or infection of the stomach or vagus nerve. °· People who have had surgery on the stomach or vagus nerve. °· People who take certain medicines. °· Women. °What are the signs or symptoms? °Symptoms of this condition include: °· An early feeling of fullness when eating. °· Nausea. °· Weight loss. °· Vomiting. °· Heartburn. °· Abdominal bloating. °· Inconsistent blood glucose levels. °· Lack of appetite. °· Acid from the stomach coming up into the esophagus (gastroesophageal reflux). °· Spasms of the stomach. °Symptoms may come and go. °How is this diagnosed? °This condition is diagnosed with tests, such as: °· Tests that check how long it takes food to move through the stomach and intestines. These tests include: °¨ Upper gastrointestinal (GI) series. In this test, X-rays of the intestines are taken after you drink a liquid. The liquid makes the intestines show up better on the X-rays. °¨ Gastric emptying scintigraphy. In this test, scans are taken after you eat food that contains a small amount of  radioactive material. °¨ Wireless capsule GI monitoring system. This test involves swallowing a capsule that records information about movement through the stomach. °· Gastric manometry. This test measures electrical and muscular activity in the stomach. It is done with a thin tube that is passed down the throat and into the stomach. °· Endoscopy. This test checks for abnormalities in the lining of the stomach. It is done with a long, thin tube that is passed down the throat and into the stomach. °· An ultrasound. This test can help rule out gallbladder disease or pancreatitis as a cause of your symptoms. It uses sound waves to take pictures of the inside of your body. °How is this treated? °There is no cure for gastroparesis. This condition may be managed with: °· Treatment of the underlying condition causing the gastroparesis. °· Lifestyle changes, including exercise and dietary changes. Dietary changes can include: °¨ Changes in what and when you eat. °¨ Eating smaller meals more often. °¨ Eating low-fat foods. °¨ Eating low-fiber forms of high-fiber foods, such as cooked vegetables instead of raw vegetables. °¨ Having liquid foods in place of solid foods. Liquid foods are easier to digest. °· Medicines. These may be given to control nausea and vomiting and to stimulate stomach muscles. °· Getting food through a feeding tube. This may be done in severe cases. °· A gastric neurostimulator. This is a device that is inserted into the body with surgery. It helps improve stomach emptying and control nausea and vomiting. °Follow these instructions at home: °· Follow your health care provider's instructions about exercise and diet. °· Take medicines only as directed by your health care provider. °Contact a health care provider   if: °· Your symptoms do not improve with treatment. °· You have new symptoms. °Get help right away if: °· You have severe abdominal pain that does not improve with treatment. °· You have nausea  that does not go away. °· You cannot keep fluids down. °This information is not intended to replace advice given to you by your health care provider. Make sure you discuss any questions you have with your health care provider. °Document Released: 06/11/2005 Document Revised: 11/17/2015 Document Reviewed: 06/07/2014 °© 2017 [employer] ° °

## 2016-05-09 NOTE — Progress Notes (Signed)
Subjective:  Patient ID: [name], female    DOB: 13-Aug-1965  Age: 50 y.o. MRN: [id number]  CC: Diabetes and gastroparesis   HPI [name] is a 50 year old female with a history of uncontrolled type 1 diabetes mellitus (A1c 10.3), diabetic neuropathy, diabetic gastroparesis, hypertension, anemia, anxiety and depression, multiple hospitalizations for diabetic gastroparesis here for a follow-up visit.  At her last office visit she seemed to have symptoms of withdrawal from benzodiazepine (Ativan). She looks fine today and has no complaints and states her anxiety is controlled on BuSpar. She has not had any recent gastroparesis symptoms as she has been compliant with her Reglan and Zofran.  Her random blood sugars have been less than 240 and she denies hypoglycemia.  She has also received her Lyrica for diabetic neuropathy and reports that it is helping her neuropathic pain. Despite being reminded at previous appointments she is yet to complete her [employer] application and also does not have any medical coverage and has been unable to see a GI specialist; received Marinol in the past from gastroenterology at [facility].    Past Medical History:  Diagnosis Date  . Depression   . Diabetes mellitus without complication ([place])   . Essential hypertension   . Gastroparesis   . GERD (gastroesophageal reflux disease)   . HLD (hyperlipidemia)     Past Surgical History:  Procedure Laterality Date  . COLONOSCOPY  09/27/2014   at [facility]  . ESOPHAGOGASTRODUODENOSCOPY  09/27/2014   at [facility], Dr Rolan Lipa. biospy neg for celiac, neg for H pylori.   . EYE SURGERY    . gailstones    . IR GENERIC HISTORICAL  01/24/2016   IR FLUORO GUIDE CV LINE RIGHT 01/24/2016 Darrell K Allred, PA-C [facility]  . IR GENERIC HISTORICAL  01/24/2016   IR US GUIDE VASC ACCESS RIGHT 01/24/2016 Darrell K Allred, PA-C [facility]  . POSTERIOR VITRECTOMY AND MEMBRANE PEEL-LEFT  EYE  09/28/2002  . POSTERIOR VITRECTOMY AND MEMBRANE PEEL-RIGHT EYE  03/16/2002  . RETINAL DETACHMENT SURGERY      Allergies  Allergen Reactions  . Anesthetics, Amide Nausea And Vomiting  . Penicillins Diarrhea and Nausea And Vomiting    Has patient had a PCN reaction causing immediate rash, facial/tongue/throat swelling, SOB or lightheadedness with hypotension: No Has patient had a PCN reaction causing severe rash involving mucus membranes or skin necrosis: No Has patient had a PCN reaction that required hospitalization No Has patient had a PCN reaction occurring within the last 10 years: Yes  If all of the above answers are "NO", then may proceed with Cephalosporin use.   . Buprenorphine Hcl Rash  . Encainide Nausea And Vomiting     Outpatient Medications Prior to Visit  Medication Sig Dispense Refill  . albuterol (PROVENTIL HFA;VENTOLIN HFA) 108 (90 Base) MCG/ACT inhaler Inhale 2 puffs into the lungs every 6 (six) hours as needed for wheezing or shortness of breath. 1 Inhaler 0  . Blood Glucose Monitoring Suppl (TRUE METRIX METER) DEVI 1 each by Does not apply route 3 (three) times daily before meals. 1 Device 0  . busPIRone (BUSPAR) 10 MG tablet Take 1 tablet (10 mg total) by mouth 2 (two) times daily. 60 tablet 2  . docusate sodium (COLACE) 250 MG capsule Take 1 capsule (250 mg total) by mouth 2 (two) times daily. 60 capsule 0  . ferrous sulfate 325 (65 FE) MG tablet Take 1 tablet (325 mg total) by mouth daily  with breakfast. 30 tablet 0  . FLUoxetine (PROZAC) 20 MG tablet Take 1 tablet (20 mg total) by mouth daily. 30 tablet 3  . furosemide (LASIX) 40 MG tablet Take 40 mg by mouth daily as needed for fluid.     Marland Kitchen glucose blood (TRUE METRIX BLOOD GLUCOSE TEST) test strip Use 3 times daily before meals 100 each 5  . Insulin Syringe-Needle U-100 (BD INSULIN SYRINGE ULTRAFINE) 31G X 15/64" 0.5 ML MISC 1 each by Does not apply route 4 (four) times daily as needed. 120 each 5  .  metoCLOPramide (REGLAN) 10 MG tablet Take 1 tablet (10 mg total) by mouth 3 (three) times daily before meals. 90 tablet 0  . ondansetron (ZOFRAN ODT) 4 MG disintegrating tablet Take 1 tablet (4 mg total) by mouth every 8 (eight) hours as needed for nausea or vomiting. 60 tablet 2  . polyethylene glycol (MIRALAX / GLYCOLAX) packet Take 17 g by mouth daily as needed for mild constipation. 100 each 0  . pregabalin (LYRICA) 150 MG capsule Take 1 capsule (150 mg total) by mouth 2 (two) times daily. 60 capsule 3  . insulin aspart (NOVOLOG) 100 UNIT/ML injection Inject 0-15 Units into the skin 3 (three) times daily with meals. Per sliding scale (Patient taking differently: Inject 4-15 Units into the skin 3 (three) times daily with meals. Per sliding scale) 50 mL 3  . insulin detemir (LEVEMIR) 100 UNIT/ML injection Inject 0.2 mLs (20 Units total) into the skin at bedtime. 10 mL 11  . cloNIDine (CATAPRES) 0.1 MG tablet Take 1 tablet (0.1 mg total) by mouth at bedtime. For withdrawal symptoms (Patient not taking: Reported on 05/09/2016) 5 tablet 0  . erythromycin (E-MYCIN) 250 MG tablet Take 1 tablet (250 mg total) by mouth 3 (three) times daily with meals as needed. (Patient not taking: Reported on 04/20/2016) 90 tablet 0  . pantoprazole (PROTONIX) 40 MG tablet Take 1 tablet (40 mg total) by mouth daily. (Patient not taking: Reported on 05/09/2016) 30 tablet 0   No facility-administered medications prior to visit.     ROS Review of Systems  Constitutional: Negative for activity change, appetite change and fatigue.  HENT: Negative for congestion, sinus pressure and sore throat.   Eyes: Negative for visual disturbance.  Respiratory: Negative for cough, chest tightness, shortness of breath and wheezing.   Cardiovascular: Negative for chest pain and palpitations.  Gastrointestinal: Negative for abdominal distention, abdominal pain and constipation.  Endocrine: Negative for polydipsia.  Genitourinary:  Negative for dysuria and frequency.  Musculoskeletal: Negative for arthralgias and back pain.  Skin: Negative for rash.  Neurological: Negative for tremors, light-headedness and numbness.  Hematological: Does not bruise/bleed easily.  Psychiatric/Behavioral: Negative for agitation and behavioral problems.    Objective:  BP 123/72 (BP Location: Right Arm, Patient Position: Sitting, Cuff Size: Large)   Pulse 88   Temp 98.2 F (36.8 C) (Oral)   Ht 5\' 2"  (1.575 m)   Wt 140 lb 12.8 oz (63.9 kg)   SpO2 100%   BMI 25.75 kg/m   BP/Weight 05/09/2016 04/23/2016 [id number]  Systolic BP [id number] -  Diastolic BP 72 72 -  Wt. (Lbs) 140.8 - 148  BMI 25.75 - 27.07      Physical Exam Constitutional: Negative for activity change, appetite change and fatigue.  HENT: Negative for congestion, sinus pressure and sore throat.   Eyes: Negative for visual disturbance.  Respiratory: Negative for cough, chest tightness, shortness of breath and wheezing.   Cardiovascular: Negative  for chest pain and palpitations.  Gastrointestinal: Negative for abdominal distention, abdominal pain and constipation.  Endocrine: Negative for polydipsia.  Genitourinary: Negative for dysuria and frequency.  Musculoskeletal: Negative for arthralgias and back pain.  Skin: Negative for rash.  Neurological:  Negative for light-headedness and numbness.  Hematological: Does not bruise/bleed easily.  Psychiatric/Behavioral: Negative for agitation and behavioral problems.     Lab Results  Component Value Date   HGBA1C 10.3 (H) 03/19/2016    CMP Latest Ref Rng & Units 04/21/2016 04/20/2016 04/11/2016  Glucose 65 - 99 mg/dL 34(LL) 408(H) 316(H)  BUN 6 - 20 mg/dL 15 14 13   Creatinine 0.44 - 1.00 mg/dL 1.10(H) 1.12(H) 1.11(H)  Sodium 135 - 145 mmol/L 139 136 136  Potassium 3.5 - 5.1 mmol/L 3.9 4.2 4.9  Chloride 101 - 111 mmol/L 108 99(L) 100(L)  CO2 22 - 32 mmol/L 23 24 26   Calcium 8.9 - 10.3 mg/dL 8.6(L) 10.0 9.4    Total Protein 6.5 - 8.1 g/dL - 9.0(H) 8.0  Total Bilirubin 0.3 - 1.2 mg/dL - 1.3(H) 1.3(H)  Alkaline Phos 38 - 126 U/L - 107 83  AST 15 - 41 U/L - 25 68(H)  ALT 14 - 54 U/L - 28 54     Assessment & Plan:   1. Type 1 diabetes mellitus with ketoacidosis without coma ([place]) Uncontrolled with A1c of 10.1 She reports improvement in blood sugars Continue current regimen - Glucose (CBG)  2. Diabetic ketoacidosis without coma associated with type 1 diabetes mellitus ([facility]) - insulin aspart (NOVOLOG) 100 UNIT/ML injection; Inject 0-15 Units into the skin 3 (three) times daily with meals. Per sliding scale  Dispense: 50 mL; Refill: 3 - insulin detemir (LEVEMIR) 100 UNIT/ML injection; Inject 0.2 mLs (20 Units total) into the skin at bedtime.  Dispense: 10 mL; Refill: 11  3. Gastroparesis Doing well on Reglan and Zofran Eat frequent, small portions  4. Anxiety and depression Doing well on BuSpar   Meds ordered this encounter  Medications  . pantoprazole (PROTONIX) 40 MG tablet    Sig: Take 1 tablet (40 mg total) by mouth daily.    Dispense:  30 tablet    Refill:  3  . insulin aspart (NOVOLOG) 100 UNIT/ML injection    Sig: Inject 0-15 Units into the skin 3 (three) times daily with meals. Per sliding scale    Dispense:  50 mL    Refill:  3  . insulin detemir (LEVEMIR) 100 UNIT/ML injection    Sig: Inject 0.2 mLs (20 Units total) into the skin at bedtime.    Dispense:  10 mL    Refill:  11    Follow-up: Return in about 1 month (around 06/08/2016) for Follow-up on diabetes mellitus.   [name] MD

## 2016-05-09 NOTE — Progress Notes (Signed)
Med refills

## 2016-05-14 IMAGING — DX DG ABD PORTABLE 2V
2 series · 2 of 2 positions shown · non-contrast
Comparison: 06/16/2015

CLINICAL DATA: Acute abdominal pain with nausea and vomiting

EXAM:
PORTABLE ABDOMEN - 2 VIEW

[abdomen kub]
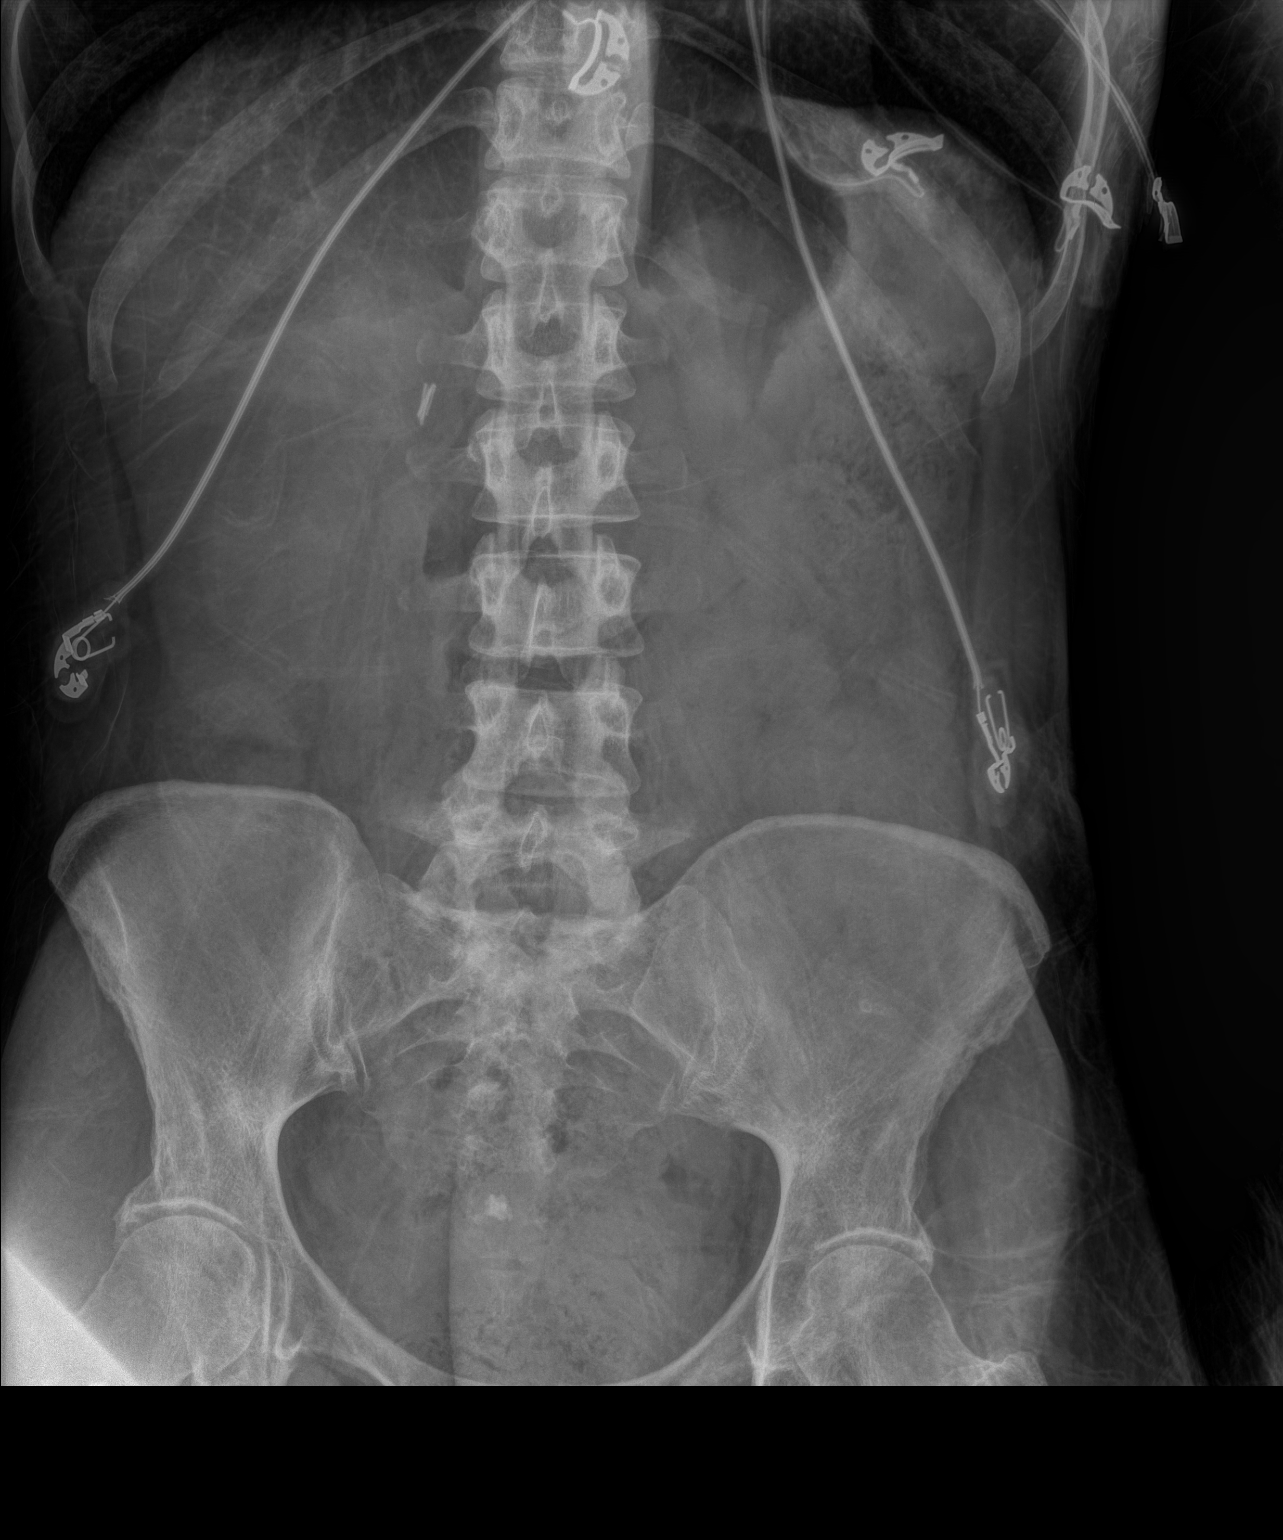

[abdomen decu]
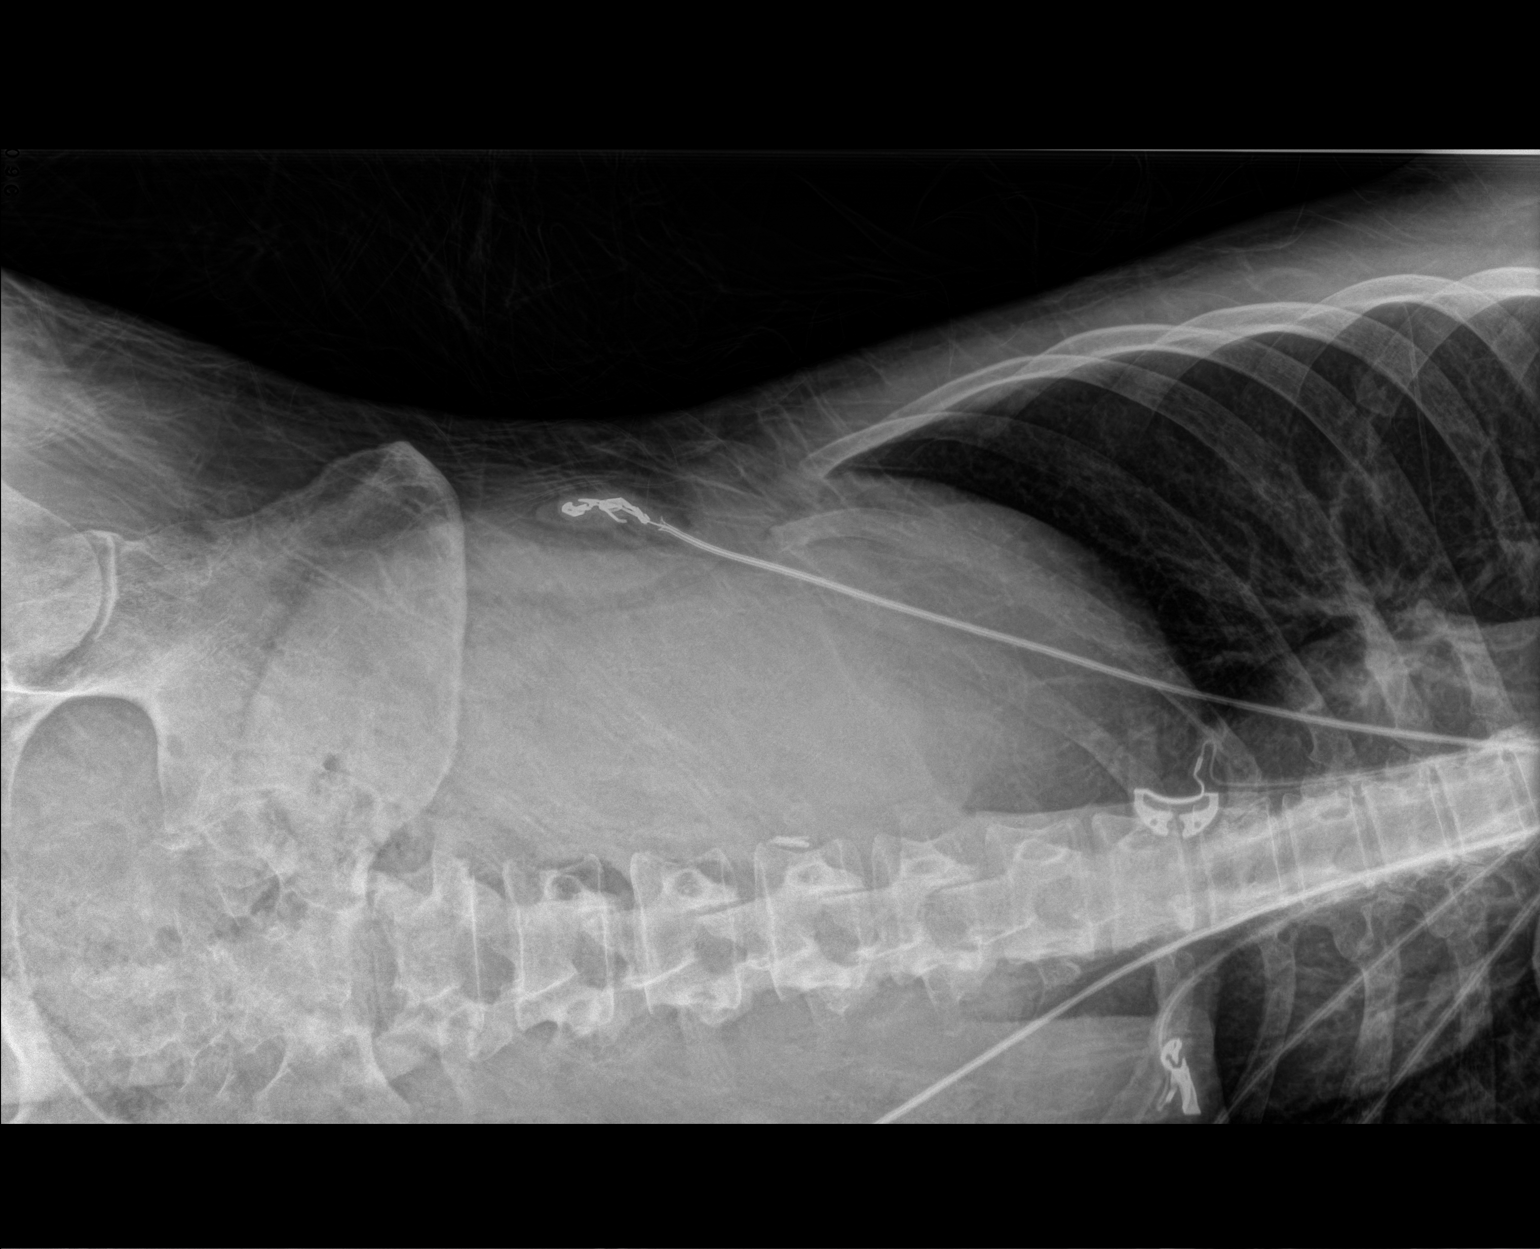

[2 of 2 positions shown; findings below may reference images not displayed]

FINDINGS: Clear lung bases. Relative paucity of bowel gas. No significant
obstruction pattern or ileus. No free air evident. Prior
cholecystectomy noted. No acute osseous finding.
IMPRESSION: No acute finding by plain radiography.

## 2016-05-14 IMAGING — DX DG CHEST 1V PORT
1 series · 1 of 1 positions shown · non-contrast
Comparison: 07/29/2015 at 4246 hours

CLINICAL DATA: Nausea/ vomiting, UTI

EXAM:
PORTABLE CHEST 1 VIEW

[chest ap]
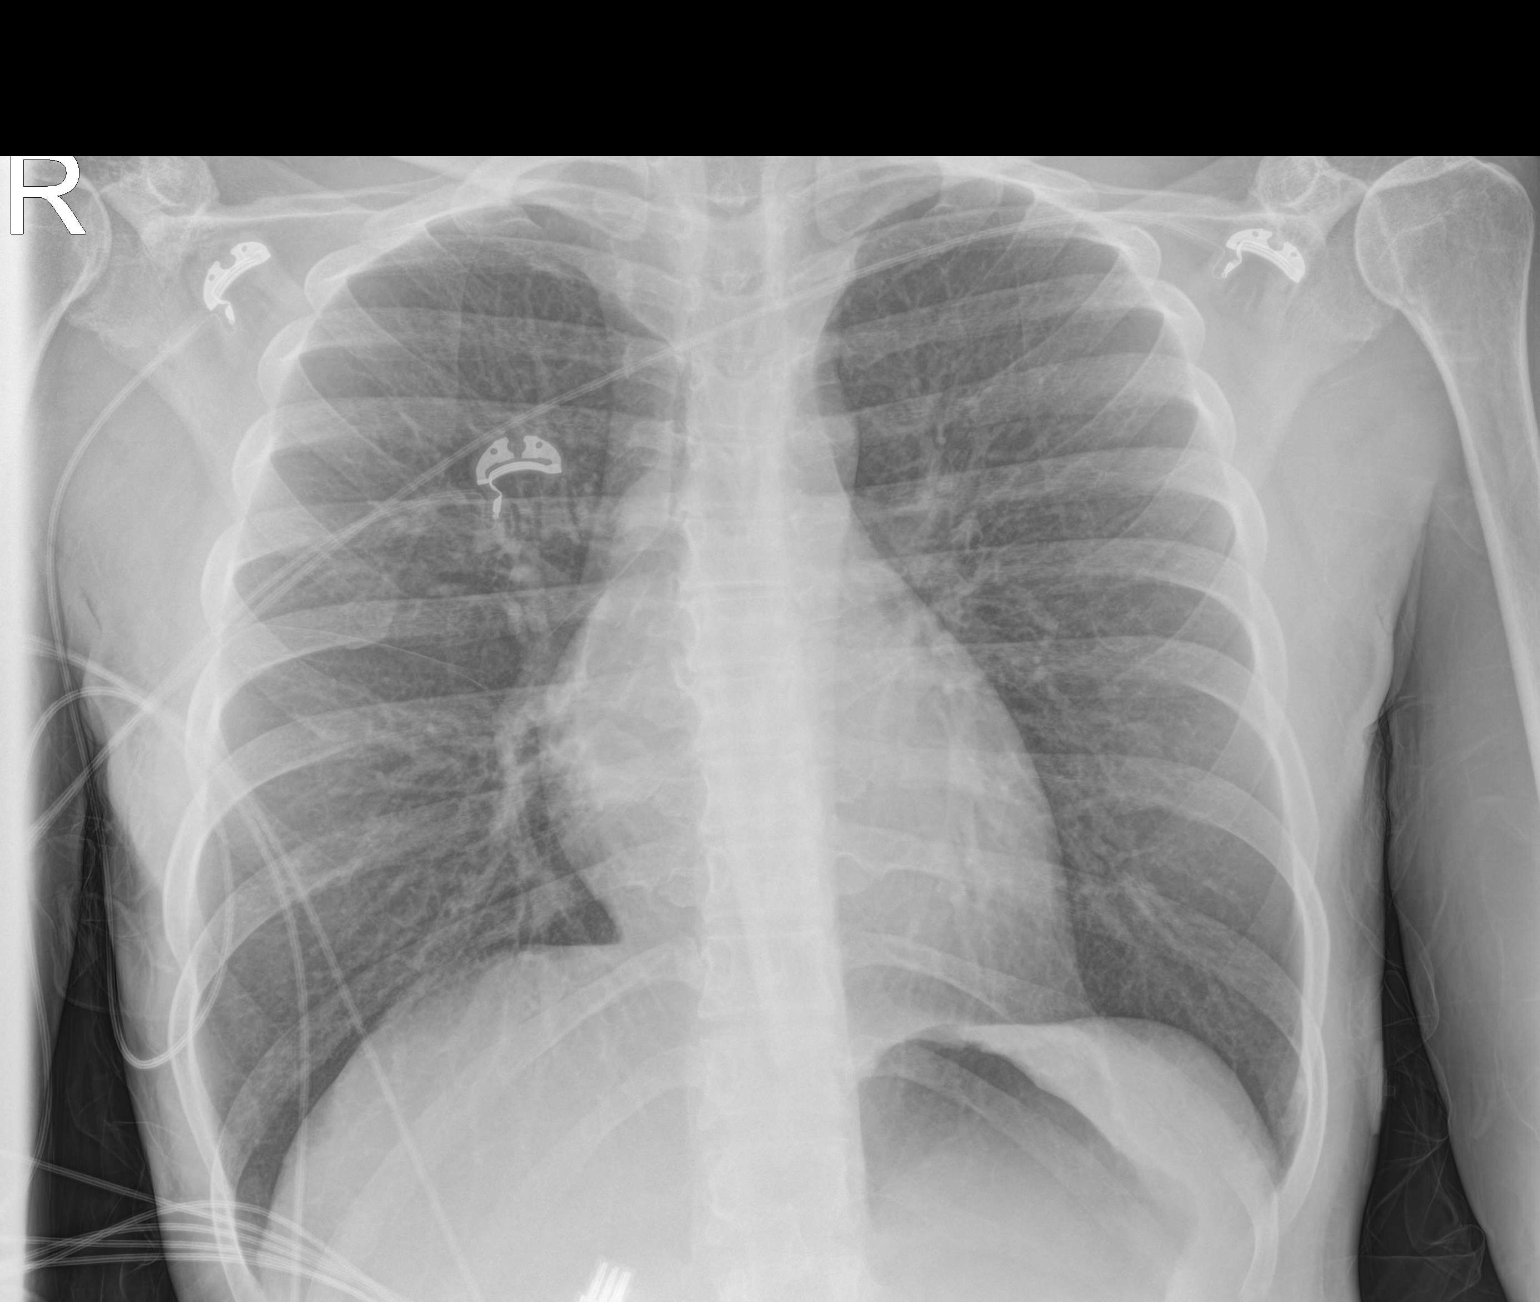

[1 of 1 positions shown; findings below may reference images not displayed]

FINDINGS: Lungs are clear. Right lower lobe opacity noted on recent chest
radiograph earlier today is not apparent on the current study.

No pleural effusion or pneumothorax.

The heart is normal in size.

Visualized osseous structures are within normal limits.
IMPRESSION: No evidence of acute cardiopulmonary disease.

Right lower lobe opacity noted on chest radiograph earlier today is
not evident on the current study.

## 2016-05-14 IMAGING — DX DG CHEST 1V PORT
1 series · 1 of 1 positions shown · non-contrast
Comparison: Portable chest x-ray June 18, 2015

CLINICAL DATA: New onset since last night of nausea vomiting and
possible fever

EXAM:
PORTABLE CHEST 1 VIEW

[chest ap]
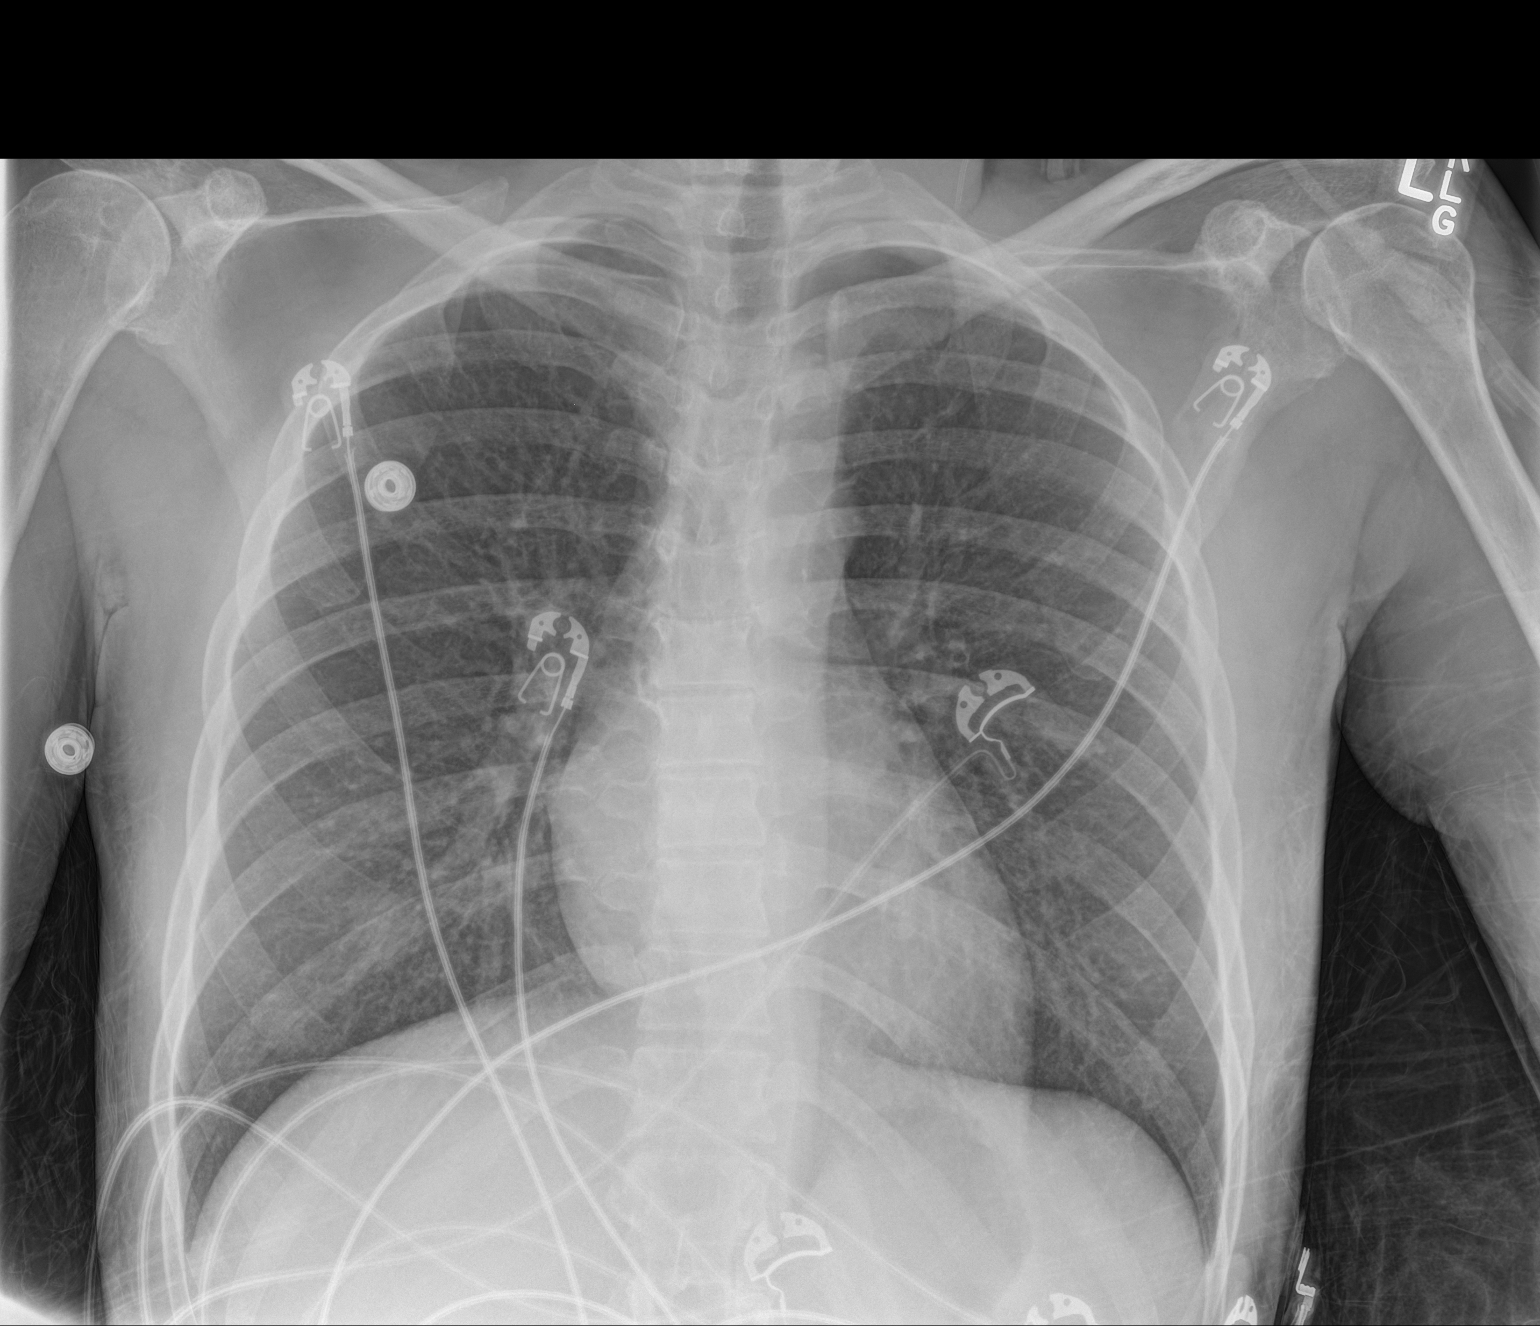

[1 of 1 positions shown; findings below may reference images not displayed]

FINDINGS: There is subtle increased density with a nodular configuration in
the right infrahilar region. The left lung is clear. There is no
pleural effusion. The heart and pulmonary vascularity are normal.
The bony structures are unremarkable.
IMPRESSION: Subtle increased density in the right infrahilar region may reflect
early pneumonia. A mass is less likely given the normal appearance
of this region approximately 5 weeks ago. Followup PA and lateral
chest X-ray is recommended in 3-4 weeks following trial of
antibiotic therapy to ensure resolution and exclude underlying
malignancy.

## 2016-05-30 ENCOUNTER — Telehealth: Payer: Self-pay

## 2016-05-30 NOTE — Telephone Encounter (Signed)
This Case Manager placed call to patient to patient to check on status and to discuss scheduling follow-up appointment with Dr. Jarold Song. Call placed to [phone]; however, unable to reach patient. HIPPA compliant voicemail left requesting return call.

## 2016-07-31 ENCOUNTER — Ambulatory Visit: Payer: Self-pay | Admitting: Physician Assistant

## 2016-08-31 IMAGING — DX DG ABD PORTABLE 2V
3 series · 3 of 3 positions shown · non-contrast
Comparison: 07/29/2015.  04/16/2015.

CLINICAL DATA: Vomiting.  Abdominal pain.

EXAM:
PORTABLE ABDOMEN - 2 VIEW

[abdomen erect]
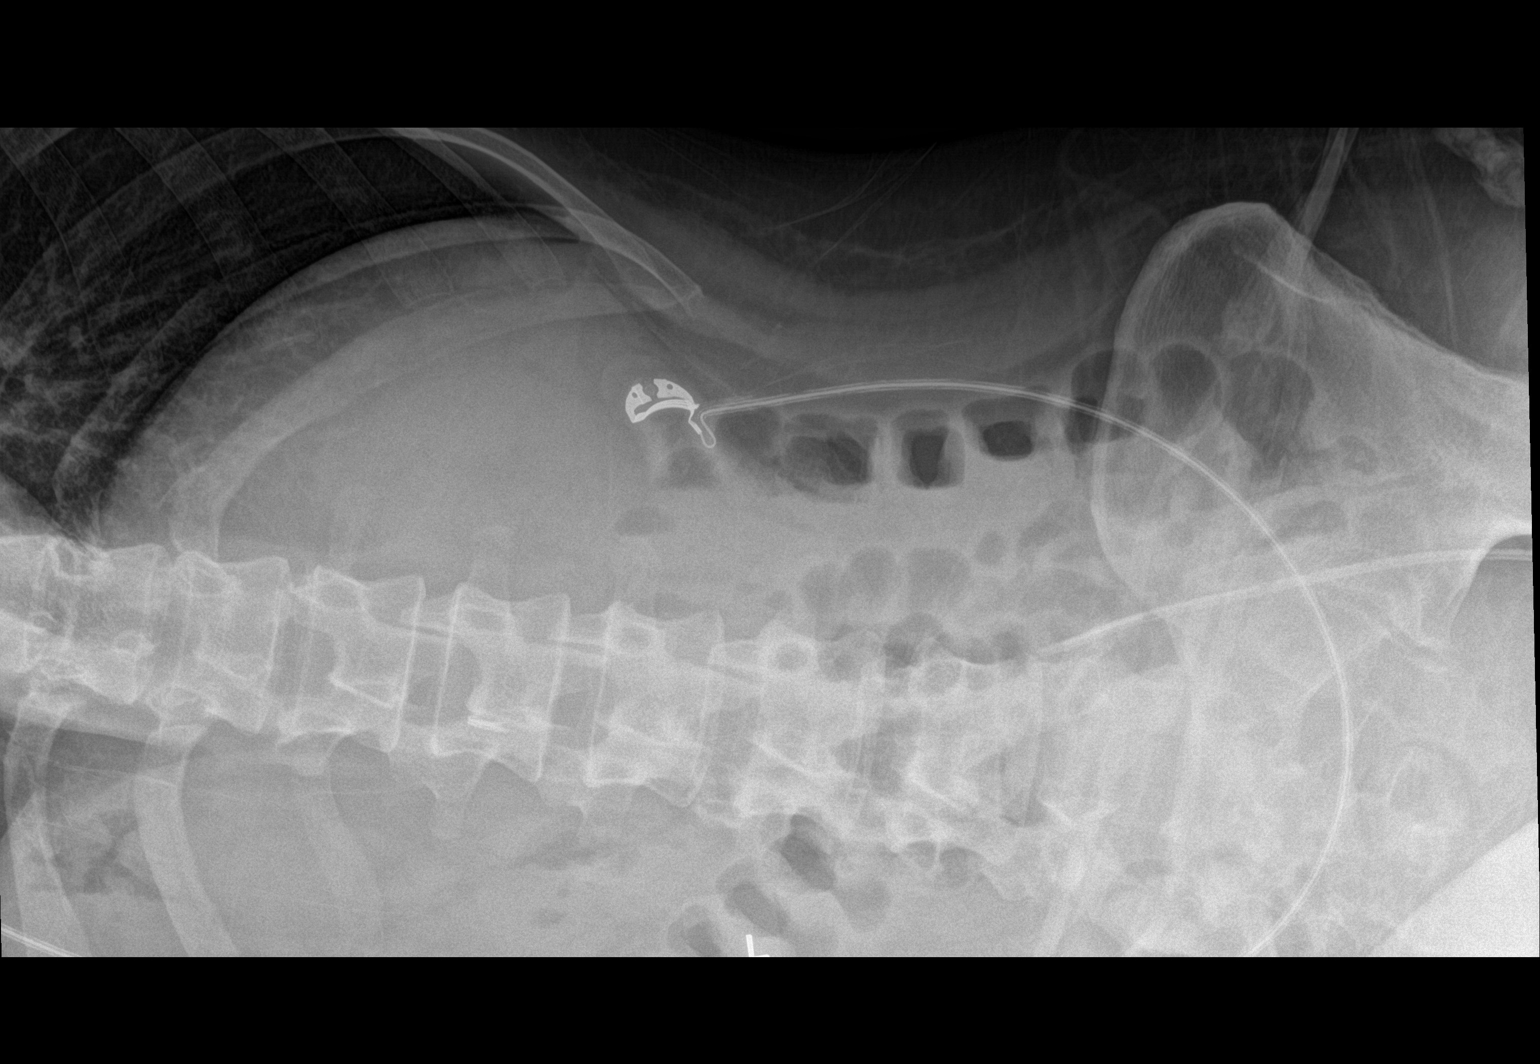

[abdomen supine (1 of 2)]
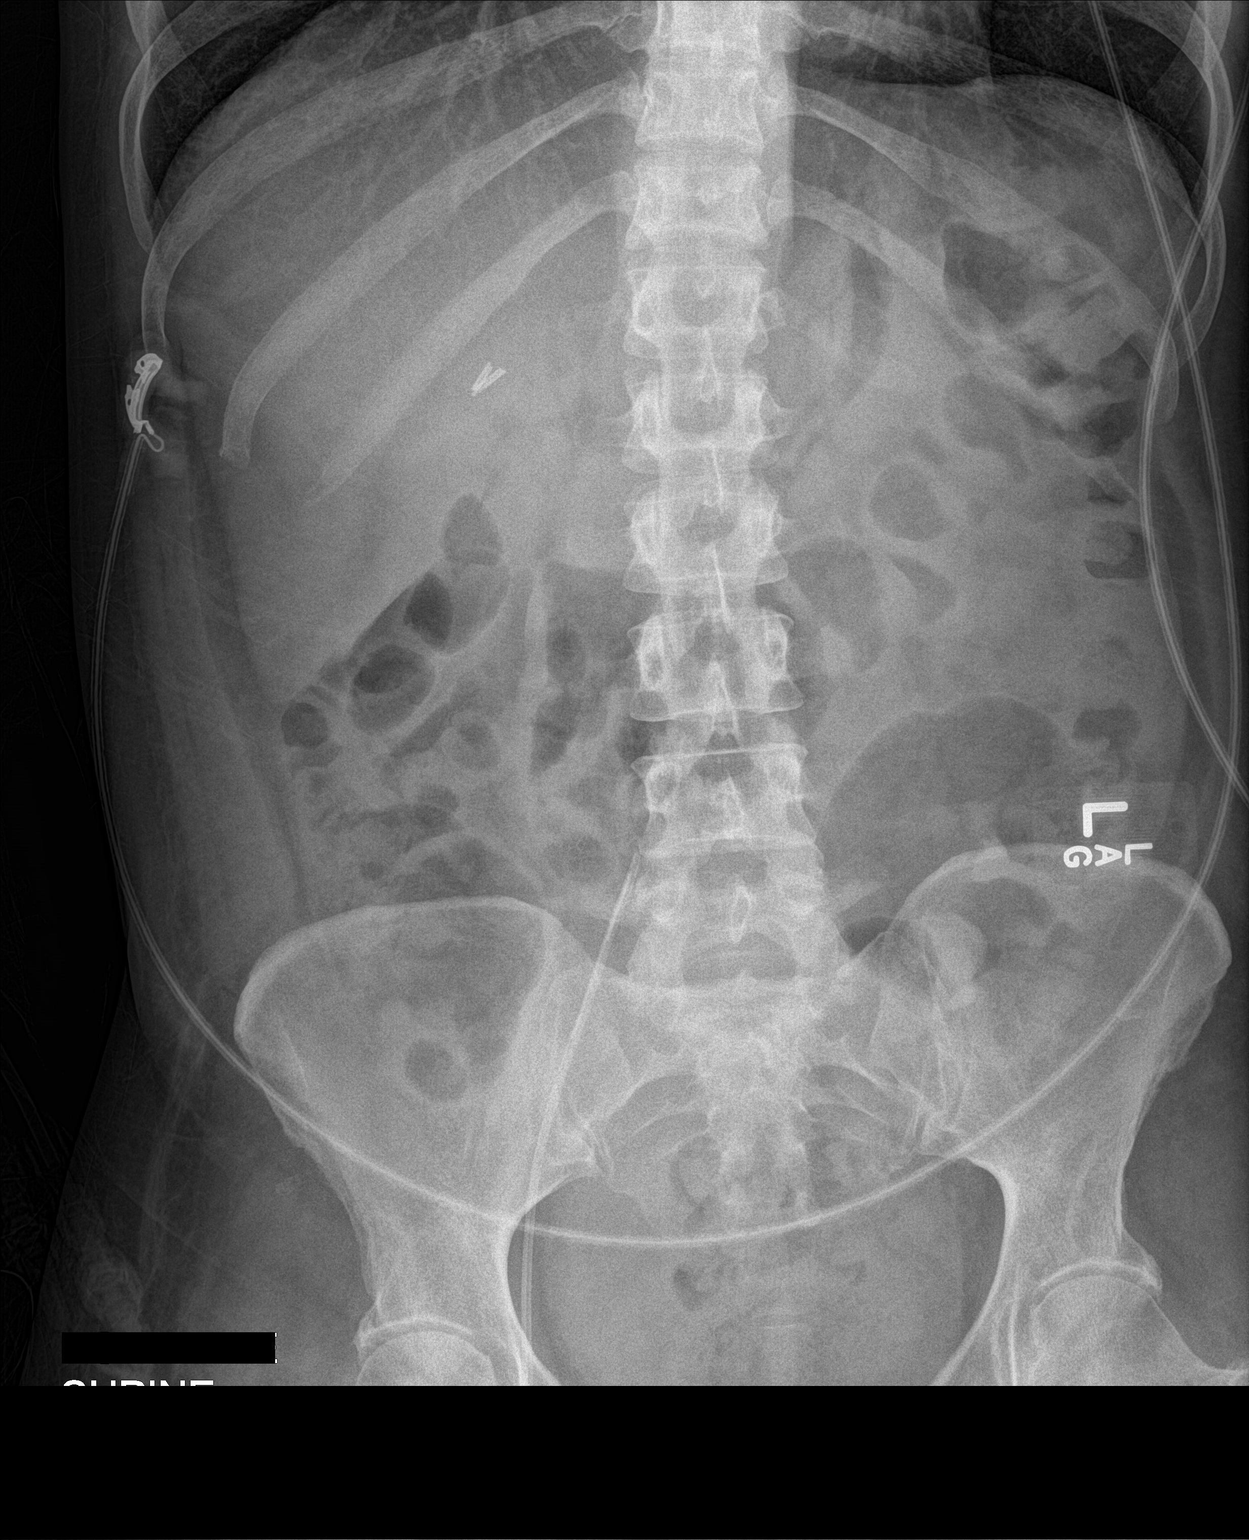

[abdomen supine (2 of 2)]
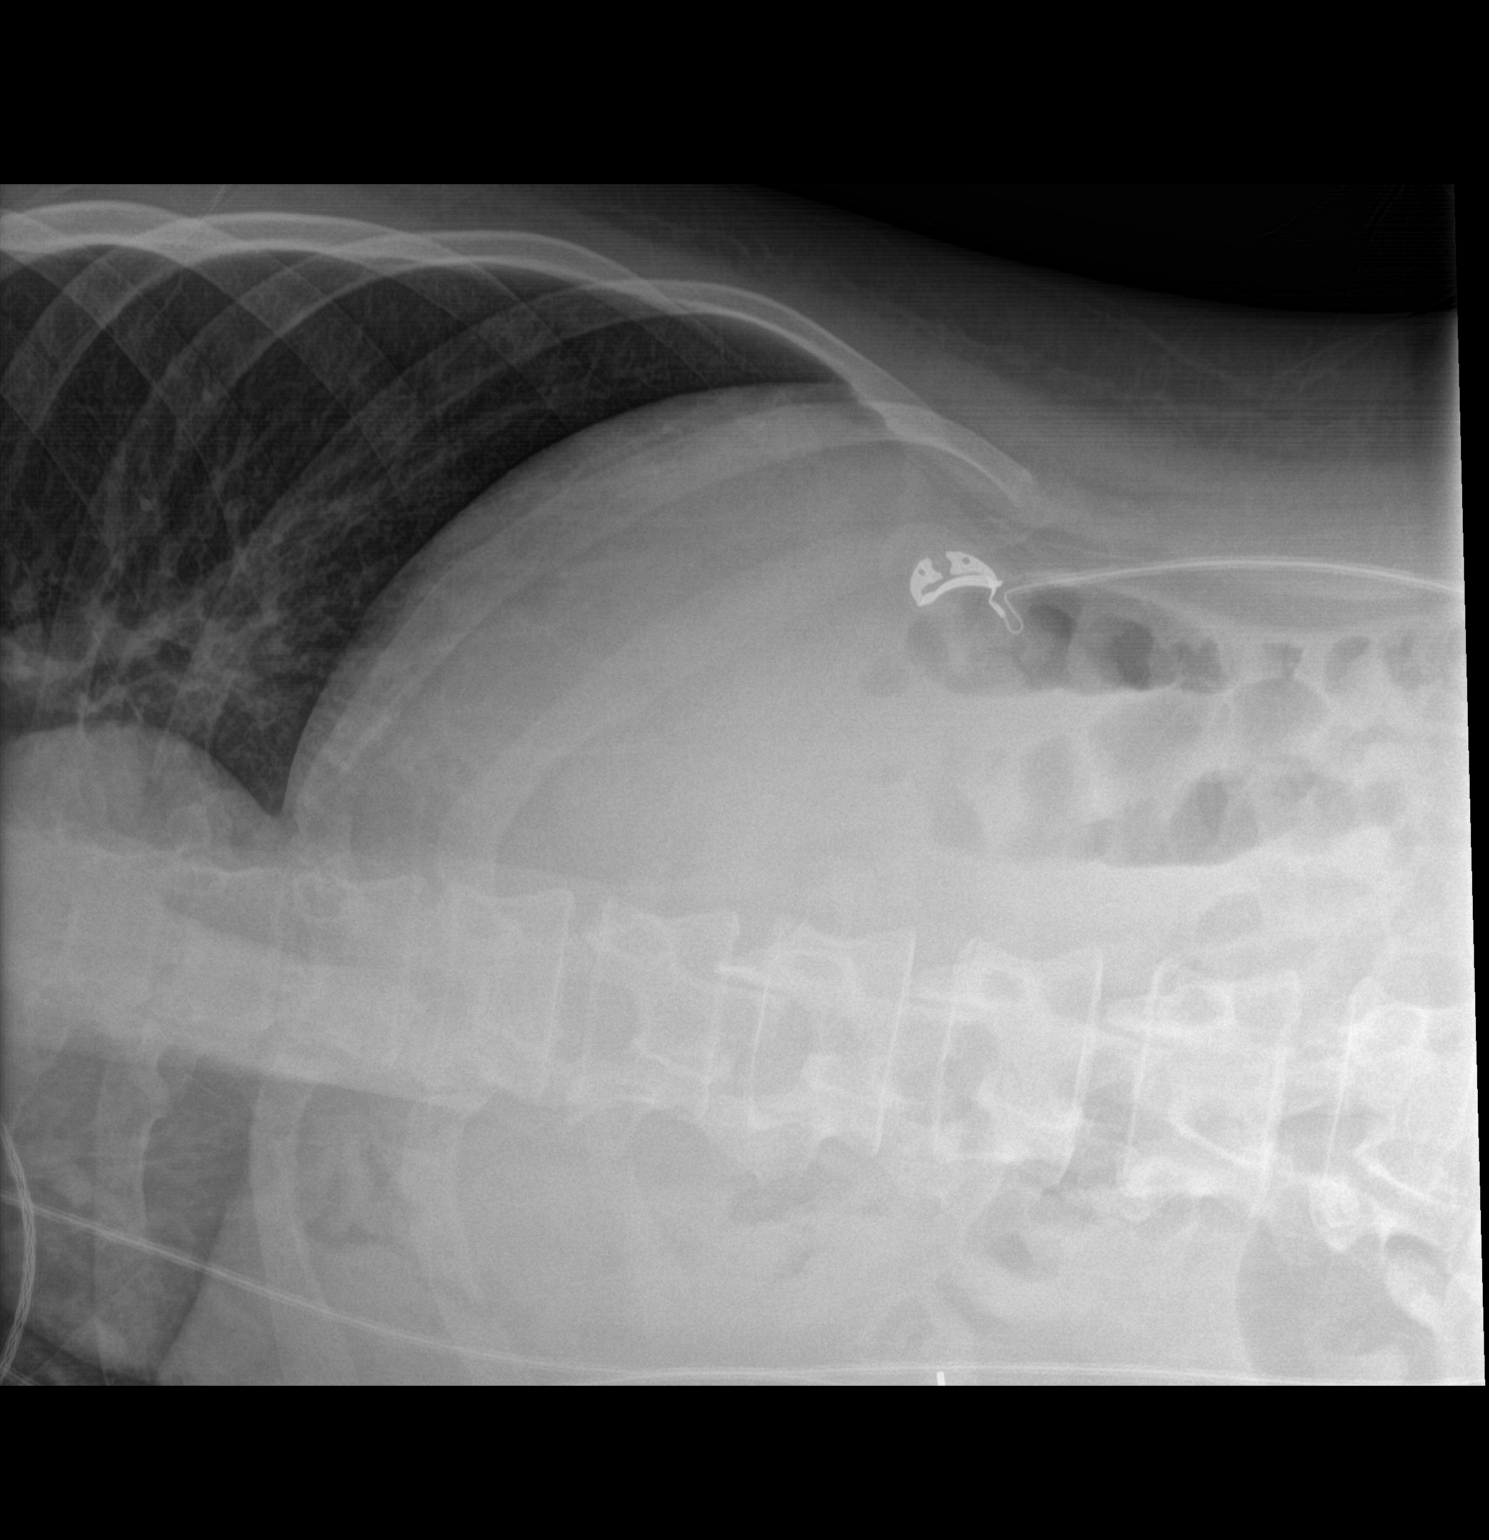

[3 of 3 positions shown; findings below may reference images not displayed]

FINDINGS: Right femoral line noted with its tip projected at the level of
L4-L5. Surgical clips right upper quadrant. No significant bowel
distention. Several nonspecific nondilated air-filled loops of small
bowel noted. Moderate stool volume. No free air. Lumbar spine
scoliosis concave left. No acute bony abnormality.
IMPRESSION: 1.  Right femoral line noted with its tip at the L4-L5 level.

2.  No acute abnormality identified.

## 2016-09-04 ENCOUNTER — Ambulatory Visit: Payer: Medicaid Other | Attending: Family Medicine | Admitting: Family Medicine

## 2016-09-04 ENCOUNTER — Ambulatory Visit: Payer: Medicaid Other | Admitting: Licensed Clinical Social Worker

## 2016-09-04 ENCOUNTER — Encounter: Payer: Self-pay | Admitting: Family Medicine

## 2016-09-04 VITALS — BP 155/84 | HR 90 | Temp 98.1°F | Ht 62.0 in | Wt 144.6 lb

## 2016-09-04 DIAGNOSIS — M79642 Pain in left hand: Secondary | ICD-10-CM

## 2016-09-04 DIAGNOSIS — Z0001 Encounter for general adult medical examination with abnormal findings: Secondary | ICD-10-CM | POA: Insufficient documentation

## 2016-09-04 DIAGNOSIS — E1065 Type 1 diabetes mellitus with hyperglycemia: Secondary | ICD-10-CM | POA: Diagnosis not present

## 2016-09-04 DIAGNOSIS — F418 Other specified anxiety disorders: Secondary | ICD-10-CM

## 2016-09-04 DIAGNOSIS — Z79899 Other long term (current) drug therapy: Secondary | ICD-10-CM | POA: Diagnosis not present

## 2016-09-04 DIAGNOSIS — E1043 Type 1 diabetes mellitus with diabetic autonomic (poly)neuropathy: Secondary | ICD-10-CM | POA: Insufficient documentation

## 2016-09-04 DIAGNOSIS — F329 Major depressive disorder, single episode, unspecified: Secondary | ICD-10-CM

## 2016-09-04 DIAGNOSIS — F32A Depression, unspecified: Secondary | ICD-10-CM

## 2016-09-04 DIAGNOSIS — F419 Anxiety disorder, unspecified: Secondary | ICD-10-CM

## 2016-09-04 DIAGNOSIS — Z9889 Other specified postprocedural states: Secondary | ICD-10-CM | POA: Diagnosis not present

## 2016-09-04 DIAGNOSIS — Z794 Long term (current) use of insulin: Secondary | ICD-10-CM | POA: Diagnosis not present

## 2016-09-04 DIAGNOSIS — E1010 Type 1 diabetes mellitus with ketoacidosis without coma: Secondary | ICD-10-CM

## 2016-09-04 DIAGNOSIS — K3184 Gastroparesis: Secondary | ICD-10-CM | POA: Diagnosis not present

## 2016-09-04 DIAGNOSIS — K5900 Constipation, unspecified: Secondary | ICD-10-CM | POA: Insufficient documentation

## 2016-09-04 DIAGNOSIS — Z880 Allergy status to penicillin: Secondary | ICD-10-CM | POA: Insufficient documentation

## 2016-09-04 DIAGNOSIS — M21611 Bunion of right foot: Secondary | ICD-10-CM

## 2016-09-04 DIAGNOSIS — E1143 Type 2 diabetes mellitus with diabetic autonomic (poly)neuropathy: Secondary | ICD-10-CM

## 2016-09-04 DIAGNOSIS — K5909 Other constipation: Secondary | ICD-10-CM

## 2016-09-04 DIAGNOSIS — E1040 Type 1 diabetes mellitus with diabetic neuropathy, unspecified: Secondary | ICD-10-CM

## 2016-09-04 LAB — GLUCOSE, POCT (MANUAL RESULT ENTRY): POC Glucose: 295 mg/dl — AB (ref 70–99)

## 2016-09-04 LAB — POCT GLYCOSYLATED HEMOGLOBIN (HGB A1C): HEMOGLOBIN A1C: 9.9

## 2016-09-04 MED ORDER — FLUOXETINE HCL 20 MG PO TABS: 40.0000 mg | ORAL_TABLET | Freq: Every day | ORAL | 3 refills | Status: DC

## 2016-09-04 MED ORDER — POLYETHYLENE GLYCOL 3350 17 G PO PACK: 17.0000 g | PACK | Freq: Every day | ORAL | 0 refills | Status: DC | PRN

## 2016-09-04 MED ORDER — INSULIN DETEMIR 100 UNIT/ML ~~LOC~~ SOLN: 15.0000 [IU] | Freq: Two times a day (BID) | SUBCUTANEOUS | 11 refills | Status: DC

## 2016-09-04 MED ORDER — PREGABALIN 150 MG PO CAPS: 150.0000 mg | ORAL_CAPSULE | Freq: Two times a day (BID) | ORAL | 3 refills | Status: DC

## 2016-09-04 MED ORDER — INSULIN ASPART 100 UNIT/ML ~~LOC~~ SOLN: 0.0000 [IU] | Freq: Three times a day (TID) | SUBCUTANEOUS | 3 refills | Status: DC

## 2016-09-04 MED ORDER — BUSPIRONE HCL 10 MG PO TABS: 10.0000 mg | ORAL_TABLET | Freq: Two times a day (BID) | ORAL | 2 refills | Status: DC

## 2016-09-04 MED ORDER — ONDANSETRON 4 MG PO TBDP: 4.0000 mg | ORAL_TABLET | Freq: Three times a day (TID) | ORAL | 2 refills | Status: DC | PRN

## 2016-09-04 MED ORDER — ATORVASTATIN CALCIUM 40 MG PO TABS: 40.0000 mg | ORAL_TABLET | Freq: Every day | ORAL | 3 refills | Status: DC

## 2016-09-04 MED FILL — !NOVOLOG 100UNITS/ML VIAL: 100/ML | 28 days supply | Qty: 20 | Fill #0

## 2016-09-04 MED FILL — busPIRone HCL 10 MG TABS: 10 | 30 days supply | Qty: 60 | Fill #0

## 2016-09-04 MED FILL — !LEVEMIR 100 UNITS/ML VIAL: 100/ML | 28 days supply | Qty: 10 | Fill #0

## 2016-09-04 MED FILL — ?FLUOXETINE HCL 20MG TABLET: 20 | 30 days supply | Qty: 60 | Fill #0

## 2016-09-04 MED FILL — POLYETHYLENE GLYCOL 3350: 15 days supply | Qty: 255 | Fill #0

## 2016-09-04 MED FILL — ONDANSETRON ODT 4 MG TABLET: 4 | 20 days supply | Qty: 60 | Fill #0

## 2016-09-04 MED FILL — ?ATORVASTATIN 40MG TABLET: 40 | 30 days supply | Qty: 30 | Fill #0

## 2016-09-04 NOTE — Progress Notes (Signed)
Medication refills Requesting to speak with [facility]

## 2016-09-04 NOTE — Patient Instructions (Signed)
Major Depressive Disorder, Adult Major depressive disorder (MDD) is a mental health condition. It may also be called clinical depression or unipolar depression. MDD usually causes feelings of sadness, hopelessness, or helplessness. MDD can also cause physical symptoms. It can interfere with work, school, relationships, and other everyday activities. MDD may be mild, moderate, or severe. It may occur once (single episode major depressive disorder) or it may occur multiple times (recurrent major depressive disorder). What are the causes? The exact cause of this condition is not known. MDD is most likely caused by a combination of things, which may include:  Genetic factors. These are traits that are passed along from parent to child.  Individual factors. Your personality, your behavior, and the way you handle your thoughts and feelings may contribute to MDD. This includes personality traits and behaviors learned from others.  Physical factors, such as: ? Differences in the part of your brain that controls emotion. This part of your brain may be different than it is in people who do not have MDD. ? Long-term (chronic) medical or psychiatric illnesses.  Social factors. Traumatic experiences or major life changes may play a role in the development of MDD.  What increases the risk? This condition is more likely to develop in women. The following factors may also make you more likely to develop MDD:  A family history of depression.  Troubled family relationships.  Abnormally low levels of certain brain chemicals.  Traumatic events in childhood, especially abuse or the loss of a parent.  Being under a lot of stress, or long-term stress, especially from upsetting life experiences or losses.  A history of: ? Chronic physical illness. ? Other mental health disorders. ? Substance abuse.  Poor living conditions.  Experiencing social exclusion or discrimination on a regular basis.  What are  the signs or symptoms? The main symptoms of MDD typically include:  Constant depressed or irritable mood.  Loss of interest in things and activities.  MDD symptoms may also include:  Sleeping or eating too much or too little.  Unexplained weight change.  Fatigue or low energy.  Feelings of worthlessness or guilt.  Difficulty thinking clearly or making decisions.  Thoughts of suicide or of harming others.  Physical agitation or weakness.  Isolation.  Severe cases of MDD may also occur with other symptoms, such as:  Delusions or hallucinations, in which you imagine things that are not real (psychotic depression).  Low-level depression that lasts at least a year (chronic depression or persistent depressive disorder).  Extreme sadness and hopelessness (melancholic depression).  Trouble speaking and moving (catatonic depression).  How is this diagnosed? This condition may be diagnosed based on:  Your symptoms.  Your medical history, including your mental health history. This may involve tests to evaluate your mental health. You may be asked questions about your lifestyle, including any drug and alcohol use, and how long you have had symptoms of MDD.  A physical exam.  Blood tests to rule out other conditions.  You must have a depressed mood and at least four other MDD symptoms most of the day, nearly every day in the same 2-week timeframe before your health care provider can confirm a diagnosis of MDD. How is this treated? This condition is usually treated by mental health professionals, such as psychologists, psychiatrists, and clinical social workers. You may need more than one type of treatment. Treatment may include:  Psychotherapy. This is also called talk therapy or counseling. Types of psychotherapy include: ? Cognitive behavioral   therapy (CBT). This type of therapy teaches you to recognize unhealthy feelings, thoughts, and behaviors, and replace them with  positive thoughts and actions. ? Interpersonal therapy (IPT). This helps you to improve the way you relate to and communicate with others. ? Family therapy. This treatment includes members of your family.  Medicine to treat anxiety and depression, or to help you control certain emotions and behaviors.  Lifestyle changes, such as: ? Limiting alcohol and drug use. ? Exercising regularly. ? Getting plenty of sleep. ? Making healthy eating choices. ? Spending more time outdoors.  Treatments involving stimulation of the brain can be used in situations with extremely severe symptoms, or when medicine or other therapies do not work over time. These treatments include electroconvulsive therapy, transcranial magnetic stimulation, and vagal nerve stimulation. Follow these instructions at home: Activity  Return to your normal activities as told by your health care provider.  Exercise regularly and spend time outdoors as told by your health care provider. General instructions  Take over-the-counter and prescription medicines only as told by your health care provider.  Do not drink alcohol. If you drink alcohol, limit your alcohol intake to no more than 1 drink a day for nonpregnant women and 2 drinks a day for men. One drink equals 12 oz of beer, 5 oz of wine, or 1 oz of hard liquor. Alcohol can affect any antidepressant medicines you are taking. Talk to your health care provider about your alcohol use.  Eat a healthy diet and get plenty of sleep.  Find activities that you enjoy doing, and make time to do them.  Consider joining a support group. Your health care provider may be able to recommend a support group.  Keep all follow-up visits as told by your health care provider. This is important. Where to find more information: National Alliance on Mental Illness  www.nami.org  U.S. National Institute of Mental Health  www.nimh.nih.gov  National Suicide Prevention  Lifeline  1-800-273-TALK (8255). This is free, 24-hour help.  Contact a health care provider if:  Your symptoms get worse.  You develop new symptoms. Get help right away if:  You self-harm.  You have serious thoughts about hurting yourself or others.  You see, hear, taste, smell, or feel things that are not present (hallucinate). This information is not intended to replace advice given to you by your health care provider. Make sure you discuss any questions you have with your health care provider. Document Released: 10/06/2012 Document Revised: 02/16/2016 Document Reviewed: 12/21/2015 Elsevier Interactive Patient Education  2017 Elsevier Inc.  

## 2016-09-04 NOTE — BH Specialist Note (Signed)
Integrated Behavioral Health Initial Visit  MRN: [id number] Name: [name]   Session Start time: 3:10 PM Session End time: 3:40 PM Total time: 30 minutes  Type of Service: [place] Interpretor:No. Interpretor Name and Language: N/A   Warm Hand Off Completed.       SUBJECTIVE: [name] is a 51 y.o. female accompanied by patient. Patient was referred by Dr. Jarold Song for anxiety, depression, and community resources to assist with applying for disability. Patient reports the following symptoms/concerns: feelings of sadness and worry, low energy/motivation, racing thoughts, and financial strain Duration of problem: Ongoing Pt reports increase in severity of symptoms within the last two months; Severity of problem: severe  OBJECTIVE: Mood: Depressed and Affect: Appropriate Risk of harm to self or others: No plan to harm self or others   LIFE CONTEXT: Family and Social: Pt resides with sister and has adult son who resides nearby School/Work: Pt is unemployed and receives financial support from sister and adult son. She recently applied for medicaid (pending) and was approved for food stamps for the next three months Self-Care: Pt watches television to help cope with anxiety. No report of substance use Life Changes: Pt recently moved into an apartment with sister. States that family is becoming impatient with her and she may be on the verge of homelessness if her sister decides to move out  GOALS ADDRESSED: Patient will reduce symptoms of: anxiety and depression and increase knowledge and/or ability of: coping skills and also: Increase adequate support systems for patient/family   INTERVENTIONS: Solution-Focused Strategies and Supportive Counseling  Standardized Assessments completed: GAD-7, PHQ 2 and PHQ 9  ASSESSMENT: Patient currently experiencing depression and anxiety triggered by financial strain. Patient reports feelings of sadness  and worry, low energy/motivation, and racing thoughts. She receives financial support from her sister and adult son; however, stated that they complain about providing for her. Patient may benefit from psychotherapy and medication management. LCSWA discussed how psychosocial stressors can negatively impact one's physical and mental health. Patient and LCSWA discussed healthy coping skills to utilize on a daily basis to decrease symptoms. Patient verbalized importance of complying with medication management and is open to initiating therapy through [facility]. LCSWA provided pt with additional resources to assist with applying for disability, transportation, and independent housing once income is established.   PLAN: 1. Follow up with behavioral health clinician on : Pt was encouraged to contact [place] if symptoms worsen or fail to improve to schedule behavioral appointments at [facility]. 2. Behavioral recommendations: LCSWA recommends that pt apply healthy coping skills discussed, comply with medication management, initiate psychotherapy, and utilize community resources. Pt is encouraged to schedule follow up appointment with LCSWA 3. Referral(s): [employer] forces logistics/support/administrative officer (LME/Outside Clinic) and [employer] Resources:  Finances and Housing 4. "From scale of 1-10, how likely are you to follow plan?": 7/10  Rebekah Chesterfield, LCSW  09/04/16 5:39 PM

## 2016-09-04 NOTE — Progress Notes (Signed)
Subjective:  Patient ID: [name], female    DOB: 09-Jul-1965  Age: 51 y.o. MRN: [id number]  CC: Depression ("feels like a failure"); Diabetes; Fatigue; and stomach issues (constipation/pain when having a BM)   HPI [name] is a 51 year old female with a history of uncontrolled type 1 diabetes mellitus (A1c 10.3), diabetic neuropathy, diabetic gastroparesis, hypertension, anemia, anxiety and depression, multiple hospitalizations for diabetic gastroparesis here for a follow-up visit.  She Complains of difficulty obtaining food to eat as she is currently unemployed. She has been obtaining food systems lasting only 3 months. This is causing her much depression coupled with other psychosocial stressors; she denies suicidal ideation or intention. She is also depressed due to the fact that she was denied disability and will have to redo the application. Would like a letter stating she is currently unemployed so she can obtain food stamps.  With regards to her gastroparesis she has not been vomiting and her nausea has improved fasting sugar less than 150 random sugars are between 150 and 280. She denies hypoglycemic episodes. Takes Lyrica for nephropathy.   She also has left middle finger pain ever since she to go fallen brace herself with her left hand. She also hurts intermittently in the medial aspect of her right big toe but denies history of trauma.  Past Medical History:  Diagnosis Date  . Depression   . Diabetes mellitus without complication ([place])   . Essential hypertension   . Gastroparesis   . GERD (gastroesophageal reflux disease)   . HLD (hyperlipidemia)     Past Surgical History:  Procedure Laterality Date  . COLONOSCOPY  09/27/2014   at [facility]  . ESOPHAGOGASTRODUODENOSCOPY  09/27/2014   at [facility], Dr Rolan Lipa. biospy neg for celiac, neg for H pylori.   . EYE SURGERY    . gailstones    . IR GENERIC HISTORICAL  01/24/2016   IR FLUORO GUIDE CV LINE RIGHT 01/24/2016  Darrell K Allred, PA-C [facility]  . IR GENERIC HISTORICAL  01/24/2016   IR US GUIDE VASC ACCESS RIGHT 01/24/2016 Darrell K Allred, PA-C [facility]  . POSTERIOR VITRECTOMY AND MEMBRANE PEEL-LEFT EYE  09/28/2002  . POSTERIOR VITRECTOMY AND MEMBRANE PEEL-RIGHT EYE  03/16/2002  . RETINAL DETACHMENT SURGERY      Allergies  Allergen Reactions  . Anesthetics, Amide Nausea And Vomiting  . Penicillins Diarrhea and Nausea And Vomiting    Has patient had a PCN reaction causing immediate rash, facial/tongue/throat swelling, SOB or lightheadedness with hypotension: No Has patient had a PCN reaction causing severe rash involving mucus membranes or skin necrosis: No Has patient had a PCN reaction that required hospitalization No Has patient had a PCN reaction occurring within the last 10 years: Yes  If all of the above answers are "NO", then may proceed with Cephalosporin use.   . Buprenorphine Hcl Rash  . Encainide Nausea And Vomiting     Outpatient Medications Prior to Visit  Medication Sig Dispense Refill  . Blood Glucose Monitoring Suppl (TRUE METRIX METER) DEVI 1 each by Does not apply route 3 (three) times daily before meals. 1 Device 0  . docusate sodium (COLACE) 250 MG capsule Take 1 capsule (250 mg total) by mouth 2 (two) times daily. 60 capsule 0  . furosemide (LASIX) 40 MG tablet Take 40 mg by mouth daily as needed for fluid.     Marland Kitchen glucose blood (TRUE METRIX BLOOD GLUCOSE TEST) test strip Use 3 times daily before meals 100 each 5  .  Insulin Syringe-Needle U-100 (BD INSULIN SYRINGE ULTRAFINE) 31G X 15/64" 0.5 ML MISC 1 each by Does not apply route 4 (four) times daily as needed. 120 each 5  . metoCLOPramide (REGLAN) 10 MG tablet Take 1 tablet (10 mg total) by mouth 3 (three) times daily before meals. 90 tablet 0  . pantoprazole (PROTONIX) 40 MG tablet Take 1 tablet (40 mg total) by mouth daily. 30 tablet 3  . busPIRone (BUSPAR) 10 MG tablet Take 1 tablet (10 mg total) by mouth 2 (two)  times daily. 60 tablet 2  . insulin aspart (NOVOLOG) 100 UNIT/ML injection Inject 0-15 Units into the skin 3 (three) times daily with meals. Per sliding scale 50 mL 3  . insulin detemir (LEVEMIR) 100 UNIT/ML injection Inject 0.2 mLs (20 Units total) into the skin at bedtime. 10 mL 11  . ondansetron (ZOFRAN ODT) 4 MG disintegrating tablet Take 1 tablet (4 mg total) by mouth every 8 (eight) hours as needed for nausea or vomiting. 60 tablet 2  . polyethylene glycol (MIRALAX / GLYCOLAX) packet Take 17 g by mouth daily as needed for mild constipation. 100 each 0  . pregabalin (LYRICA) 150 MG capsule Take 1 capsule (150 mg total) by mouth 2 (two) times daily. 60 capsule 3  . albuterol (PROVENTIL HFA;VENTOLIN HFA) 108 (90 Base) MCG/ACT inhaler Inhale 2 puffs into the lungs every 6 (six) hours as needed for wheezing or shortness of breath. (Patient not taking: Reported on 09/04/2016) 1 Inhaler 0  . ferrous sulfate 325 (65 FE) MG tablet Take 1 tablet (325 mg total) by mouth daily with breakfast. (Patient not taking: Reported on 09/04/2016) 30 tablet 0  . FLUoxetine (PROZAC) 20 MG tablet Take 1 tablet (20 mg total) by mouth daily. (Patient not taking: Reported on 09/04/2016) 30 tablet 3   No facility-administered medications prior to visit.     ROS Review of Systems  Constitutional: Negative for activity change, appetite change and fatigue.  HENT: Negative for congestion, sinus pressure and sore throat.   Eyes: Negative for visual disturbance.  Respiratory: Negative for cough, chest tightness, shortness of breath and wheezing.   Cardiovascular: Negative for chest pain and palpitations.  Gastrointestinal: Positive for constipation. Negative for abdominal distention and abdominal pain.  Endocrine: Negative for polydipsia.  Genitourinary: Negative for dysuria and frequency.  Musculoskeletal:       See hpi  Skin: Negative for rash.  Neurological: Positive for numbness. Negative for tremors and  light-headedness.  Hematological: Does not bruise/bleed easily.  Psychiatric/Behavioral: Positive for dysphoric mood. Negative for agitation and behavioral problems.    Objective:  BP (!) 155/84 (BP Location: Right Arm, Patient Position: Sitting, Cuff Size: Small)   Pulse 90   Temp 98.1 F (36.7 C) (Oral)   Ht 5\' 2"  (1.575 m)   Wt 144 lb 9.6 oz (65.6 kg)   SpO2 100%   BMI 26.45 kg/m   BP/Weight 09/04/2016 05/09/2016 50/08/7046  Systolic BP 889 169 450  Diastolic BP 84 72 72  Wt. (Lbs) 144.6 140.8 -  BMI 26.45 25.75 -      Physical Exam  Constitutional: She is oriented to person, place, and time. She appears well-developed and well-nourished.  HENT:  Right Ear: External ear normal.  Left Ear: External ear normal.  Mouth/Throat: Oropharynx is clear and moist.  Cardiovascular: Normal rate, normal heart sounds and intact distal pulses.   No murmur heard. Pulmonary/Chest: Effort normal and breath sounds normal. She has no wheezes. She has no rales. She  exhibits no tenderness.  Abdominal: Soft. Bowel sounds are normal. She exhibits no distension and no mass. There is no tenderness.  Musculoskeletal: She exhibits edema (slight edema and tenderness of the MCP joint of the left middle finger; unable to make a complete fist in the left hand. Right is normal). She exhibits no tenderness (No tenderness on palpation of bunion of right foot bunion of right foot.).  Neurological: She is alert and oriented to person, place, and time.   Lab Results  Component Value Date   HGBA1C 9.9 09/04/2016    Depression screen PHQ 2/9 09/04/2016  Decreased Interest 2  Down, Depressed, Hopeless 3  PHQ - 2 Score 5  Altered sleeping 3  Tired, decreased energy 3  Change in appetite 1  Feeling bad or failure about yourself  3  Trouble concentrating 1  Moving slowly or fidgety/restless 1  Suicidal thoughts 0  PHQ-9 Score 17    GAD 7 : Generalized Anxiety Score 09/04/2016 05/09/2016 04/10/2016  03/13/2016  Nervous, Anxious, on Edge 3 3 3 2   Control/stop worrying 3 3 3  0  Worry too much - different things 3 3 3 1   Trouble relaxing 2 2 2  0  Restless 1 1 3  0  Easily annoyed or irritable 2 1 3 2   Afraid - awful might happen 2 1 3 1   Total GAD 7 Score 16 14 20 6   Anxiety Difficulty - - - Not difficult at all      Assessment & Plan:   1. Anxiety and depression Worsened by underlying psychosocial stressors Increased dose of Prozac LCSW called in for psychotherapy and to help with resources for social services Left have provided so she can obtain footsteps. - FLUoxetine (PROZAC) 20 MG tablet; Take 2 tablets (40 mg total) by mouth daily.  Dispense: 60 tablet; Refill: 3 - busPIRone (BUSPAR) 10 MG tablet; Take 1 tablet (10 mg total) by mouth 2 (two) times daily.  Dispense: 60 tablet; Refill: 2  2. Diabetic gastroparesis (HCC) Improved - ondansetron (ZOFRAN ODT) 4 MG disintegrating tablet; Take 1 tablet (4 mg total) by mouth every 8 (eight) hours as needed for nausea or vomiting.  Dispense: 60 tablet; Refill: 2  3. Diabetic neuropathy, type I diabetes mellitus (HCC) Stable - pregabalin (LYRICA) 150 MG capsule; Take 1 capsule (150 mg total) by mouth 2 (two) times daily.  Dispense: 60 capsule; Refill: 3  4. Diabetic ketoacidosis without coma associated with type 1 diabetes mellitus ([place]) Uncontrolled with A1c of 9.9 Increase Levemir to 15 units twice daily - Glucose (CBG) - HgB A1c - insulin aspart (NOVOLOG) 100 UNIT/ML injection; Inject 0-15 Units into the skin 3 (three) times daily with meals. Per sliding scale  Dispense: 50 mL; Refill: 3 - insulin detemir (LEVEMIR) 100 UNIT/ML injection; Inject 0.15 mLs (15 Units total) into the skin 2 (two) times daily.  Dispense: 30 mL; Refill: 11 - atorvastatin (LIPITOR) 40 MG tablet; Take 1 tablet (40 mg total) by mouth daily.  Dispense: 30 tablet; Refill: 3 - COMPLETE METABOLIC PANEL WITH GFR - Lipid Panel w/reflex Direct LDL; Future -  Microalbumin/Creatinine Ratio, Urine - Ambulatory referral to Podiatry  5. Pain of left hand Need to exclude fracture - DG Hand Complete Left; Future  6. Bunion of great toe of right foot Could be underlying osteoarthritis at site of bunion - Ambulatory referral to Podiatry  7. Other constipation Increase fiber - polyethylene glycol (MIRALAX / GLYCOLAX) packet; Take 17 g by mouth daily as needed for  mild constipation.  Dispense: 100 each; Refill: 0   Meds ordered this encounter  Medications  . FLUoxetine (PROZAC) 20 MG tablet    Sig: Take 2 tablets (40 mg total) by mouth daily.    Dispense:  60 tablet    Refill:  3    Discontinue previous dose  . busPIRone (BUSPAR) 10 MG tablet    Sig: Take 1 tablet (10 mg total) by mouth 2 (two) times daily.    Dispense:  60 tablet    Refill:  2    Discontinue previous dose  . ondansetron (ZOFRAN ODT) 4 MG disintegrating tablet    Sig: Take 1 tablet (4 mg total) by mouth every 8 (eight) hours as needed for nausea or vomiting.    Dispense:  60 tablet    Refill:  2  . pregabalin (LYRICA) 150 MG capsule    Sig: Take 1 capsule (150 mg total) by mouth 2 (two) times daily.    Dispense:  60 capsule    Refill:  3  . insulin aspart (NOVOLOG) 100 UNIT/ML injection    Sig: Inject 0-15 Units into the skin 3 (three) times daily with meals. Per sliding scale    Dispense:  50 mL    Refill:  3  . insulin detemir (LEVEMIR) 100 UNIT/ML injection    Sig: Inject 0.15 mLs (15 Units total) into the skin 2 (two) times daily.    Dispense:  30 mL    Refill:  11    Discontinue previous dose  . atorvastatin (LIPITOR) 40 MG tablet    Sig: Take 1 tablet (40 mg total) by mouth daily.    Dispense:  30 tablet    Refill:  3  . polyethylene glycol (MIRALAX / GLYCOLAX) packet    Sig: Take 17 g by mouth daily as needed for mild constipation.    Dispense:  100 each    Refill:  0    Follow-up: Return in about 6 weeks (around 10/16/2016) for Follow-up on depression.     [name] MD

## 2016-09-05 LAB — COMPLETE METABOLIC PANEL WITH GFR
ALBUMIN: 3.9 g/dL (ref 3.6–5.1)
ALK PHOS: 96 U/L (ref 33–115)
ALT: 8 U/L (ref 6–29)
AST: 13 U/L (ref 10–35)
BILIRUBIN TOTAL: 0.5 mg/dL (ref 0.2–1.2)
BUN: 11 mg/dL (ref 7–25)
CO2: 24 mmol/L (ref 20–31)
Calcium: 9.1 mg/dL (ref 8.6–10.2)
Chloride: 102 mmol/L (ref 98–110)
Creat: 1.11 mg/dL — ABNORMAL HIGH (ref 0.50–1.10)
GFR, EST AFRICAN AMERICAN: 67 mL/min (ref >=60)
GFR, EST NON AFRICAN AMERICAN: 58 mL/min — AB (ref >=60)
Glucose, Bld: 234 mg/dL — ABNORMAL HIGH (ref 65–99)
POTASSIUM: 4.2 mmol/L (ref 3.5–5.3)
Sodium: 135 mmol/L (ref 135–146)
TOTAL PROTEIN: 7.3 g/dL (ref 6.1–8.1)

## 2016-09-05 LAB — MICROALBUMIN / CREATININE URINE RATIO
Creatinine, Urine: 97 mg/dL (ref 20–320)
MICROALB UR: 24.6 mg/dL
MICROALB/CREAT RATIO: 254 ug/mg{creat} — AB (ref <30)

## 2016-09-26 ENCOUNTER — Telehealth: Payer: Self-pay

## 2016-09-26 NOTE — Telephone Encounter (Signed)
-----   Message from [name], MD sent at 09/05/2016 12:30 PM EDT ----- She has microalbuminuria (early signs of diabetic nephropathy) due to poorly controlled diabetes. Hopefully increasing the dose of Levemir and compliance with a diabetic diet will bring the copy glycemic control.

## 2016-09-26 NOTE — Telephone Encounter (Signed)
Writer unable to reach patient by phone.  Sent letter to patient with lab results and explanation.

## 2016-09-27 ENCOUNTER — Encounter (HOSPITAL_COMMUNITY): Payer: Self-pay

## 2016-09-27 ENCOUNTER — Inpatient Hospital Stay (HOSPITAL_COMMUNITY)
Admission: EM | Admit: 2016-09-27 | Discharge: 2016-09-29 | DRG: 074 | Disposition: A | Discharge status: Home / Self Care | Payer: Medicaid Other | Attending: Family Medicine | Admitting: Family Medicine

## 2016-09-27 DIAGNOSIS — Z79899 Other long term (current) drug therapy: Secondary | ICD-10-CM

## 2016-09-27 DIAGNOSIS — E118 Type 2 diabetes mellitus with unspecified complications: Secondary | ICD-10-CM

## 2016-09-27 DIAGNOSIS — Z794 Long term (current) use of insulin: Secondary | ICD-10-CM

## 2016-09-27 DIAGNOSIS — F329 Major depressive disorder, single episode, unspecified: Secondary | ICD-10-CM | POA: Diagnosis present

## 2016-09-27 DIAGNOSIS — K3184 Gastroparesis: Secondary | ICD-10-CM

## 2016-09-27 DIAGNOSIS — Z880 Allergy status to penicillin: Secondary | ICD-10-CM

## 2016-09-27 DIAGNOSIS — R197 Diarrhea, unspecified: Secondary | ICD-10-CM

## 2016-09-27 DIAGNOSIS — I10 Essential (primary) hypertension: Secondary | ICD-10-CM | POA: Diagnosis present

## 2016-09-27 DIAGNOSIS — R112 Nausea with vomiting, unspecified: Secondary | ICD-10-CM

## 2016-09-27 DIAGNOSIS — Z888 Allergy status to other drugs, medicaments and biological substances status: Secondary | ICD-10-CM

## 2016-09-27 DIAGNOSIS — E11649 Type 2 diabetes mellitus with hypoglycemia without coma: Secondary | ICD-10-CM | POA: Diagnosis present

## 2016-09-27 DIAGNOSIS — E1110 Type 2 diabetes mellitus with ketoacidosis without coma: Secondary | ICD-10-CM | POA: Diagnosis present

## 2016-09-27 DIAGNOSIS — K219 Gastro-esophageal reflux disease without esophagitis: Secondary | ICD-10-CM

## 2016-09-27 DIAGNOSIS — Z833 Family history of diabetes mellitus: Secondary | ICD-10-CM

## 2016-09-27 DIAGNOSIS — E785 Hyperlipidemia, unspecified: Secondary | ICD-10-CM | POA: Diagnosis present

## 2016-09-27 DIAGNOSIS — R739 Hyperglycemia, unspecified: Secondary | ICD-10-CM

## 2016-09-27 DIAGNOSIS — E1143 Type 2 diabetes mellitus with diabetic autonomic (poly)neuropathy: Principal | ICD-10-CM | POA: Diagnosis present

## 2016-09-27 DIAGNOSIS — Z8249 Family history of ischemic heart disease and other diseases of the circulatory system: Secondary | ICD-10-CM

## 2016-09-27 LAB — CBC WITH DIFFERENTIAL/PLATELET
BASOS PCT: 0 %
Basophils Absolute: 0 10*3/uL (ref 0.0–0.1)
Eosinophils Absolute: 0 10*3/uL (ref 0.0–0.7)
Eosinophils Relative: 0 %
HEMATOCRIT: 36.3 % (ref 36.0–46.0)
HEMOGLOBIN: 11.6 g/dL — AB (ref 12.0–15.0)
LYMPHS ABS: 0.7 10*3/uL (ref 0.7–4.0)
LYMPHS PCT: 6 %
MCH: 24 pg — ABNORMAL LOW (ref 26.0–34.0)
MCHC: 32 g/dL (ref 30.0–36.0)
MCV: 75 fL — AB (ref 78.0–100.0)
MONOS PCT: 4 %
Monocytes Absolute: 0.4 10*3/uL (ref 0.1–1.0)
NEUTROS ABS: 10.5 10*3/uL — AB (ref 1.7–7.7)
NEUTROS PCT: 90 %
Platelets: 457 10*3/uL — ABNORMAL HIGH (ref 150–400)
RBC: 4.84 MIL/uL (ref 3.87–5.11)
RDW: 14.4 % (ref 11.5–15.5)
WBC: 11.7 10*3/uL — ABNORMAL HIGH (ref 4.0–10.5)

## 2016-09-27 LAB — COMPREHENSIVE METABOLIC PANEL
ALBUMIN: 4.3 g/dL (ref 3.5–5.0)
ALT: 14 U/L (ref 14–54)
AST: 27 U/L (ref 15–41)
Alkaline Phosphatase: 92 U/L (ref 38–126)
Anion gap: 16 — ABNORMAL HIGH (ref 5–15)
BUN: 15 mg/dL (ref 6–20)
CHLORIDE: 102 mmol/L (ref 101–111)
CO2: 17 mmol/L — ABNORMAL LOW (ref 22–32)
Calcium: 9.6 mg/dL (ref 8.9–10.3)
Creatinine, Ser: 1.04 mg/dL — ABNORMAL HIGH (ref 0.44–1.00)
GFR calc Af Amer: >60 mL/min (ref >60)
GFR calc non Af Amer: >60 mL/min (ref >60)
GLUCOSE: 317 mg/dL — AB (ref 65–99)
POTASSIUM: 4.2 mmol/L (ref 3.5–5.1)
Sodium: 135 mmol/L (ref 135–145)
Total Bilirubin: 1.4 mg/dL — ABNORMAL HIGH (ref 0.3–1.2)
Total Protein: 8.7 g/dL — ABNORMAL HIGH (ref 6.5–8.1)

## 2016-09-27 LAB — URINALYSIS, ROUTINE W REFLEX MICROSCOPIC
Bacteria, UA: NONE SEEN
Bilirubin Urine: NEGATIVE
KETONES UR: 80 mg/dL — AB
Leukocytes, UA: NEGATIVE
Nitrite: NEGATIVE
PH: 6 (ref 5.0–8.0)
Protein, ur: 100 mg/dL — AB
SPECIFIC GRAVITY, URINE: 1.018 (ref 1.005–1.030)

## 2016-09-27 LAB — I-STAT BETA HCG BLOOD, ED (MC, WL, AP ONLY)

## 2016-09-27 LAB — GLUCOSE, CAPILLARY: GLUCOSE-CAPILLARY: 432 mg/dL — AB (ref 65–99)

## 2016-09-27 LAB — CBG MONITORING, ED: Glucose-Capillary: 288 mg/dL — ABNORMAL HIGH (ref 65–99)

## 2016-09-27 LAB — LIPASE, BLOOD: LIPASE: 22 U/L (ref 11–51)

## 2016-09-27 MED ORDER — KETOROLAC TROMETHAMINE 30 MG/ML IJ SOLN
30.0000 mg | Freq: Once | INTRAMUSCULAR | Status: AC
  Administered 2016-09-27: 30 mg via INTRAVENOUS
  Filled 2016-09-27: qty 1

## 2016-09-27 MED ORDER — METOCLOPRAMIDE HCL 5 MG/ML IJ SOLN
10.0000 mg | Freq: Once | INTRAMUSCULAR | Status: AC
  Administered 2016-09-27: 10 mg via INTRAVENOUS
  Filled 2016-09-27: qty 2

## 2016-09-27 MED ORDER — SODIUM CHLORIDE 0.9 % IV SOLN
250.0000 mg | Freq: Three times a day (TID) | INTRAVENOUS | Status: DC
Start: 2016-09-27 — End: 2016-09-27

## 2016-09-27 MED ORDER — PROMETHAZINE HCL 25 MG/ML IJ SOLN
25.0000 mg | Freq: Once | INTRAMUSCULAR | Status: AC
  Administered 2016-09-27: 25 mg via INTRAVENOUS
  Filled 2016-09-27: qty 1

## 2016-09-27 MED ORDER — INSULIN ASPART 100 UNIT/ML ~~LOC~~ SOLN
0.0000 [IU] | Freq: Every day | SUBCUTANEOUS | Status: DC
  Administered 2016-09-28: 3 [IU] via SUBCUTANEOUS

## 2016-09-27 MED ORDER — HALOPERIDOL LACTATE 5 MG/ML IJ SOLN
2.0000 mg | Freq: Once | INTRAMUSCULAR | Status: AC
  Administered 2016-09-27: 2 mg via INTRAMUSCULAR

## 2016-09-27 MED ORDER — FAMOTIDINE IN NACL 20-0.9 MG/50ML-% IV SOLN
20.0000 mg | Freq: Two times a day (BID) | INTRAVENOUS | Status: DC
  Administered 2016-09-27 – 2016-09-29 (×4): 20 mg via INTRAVENOUS
  Filled 2016-09-27 (×4): qty 50

## 2016-09-27 MED ORDER — HYDRALAZINE HCL 20 MG/ML IJ SOLN
5.0000 mg | Freq: Four times a day (QID) | INTRAMUSCULAR | Status: DC | PRN
Start: 2016-09-27 — End: 2016-09-29

## 2016-09-27 MED ORDER — LORAZEPAM 1 MG PO TABS
1.0000 mg | ORAL_TABLET | Freq: Once | ORAL | Status: AC
  Administered 2016-09-27: 1 mg via ORAL
  Filled 2016-09-27: qty 1

## 2016-09-27 MED ORDER — INSULIN ASPART 100 UNIT/ML ~~LOC~~ SOLN
10.0000 [IU] | Freq: Once | SUBCUTANEOUS | Status: AC
Start: 2016-09-27 — End: 2016-09-27
  Administered 2016-09-27: 10 [IU] via SUBCUTANEOUS

## 2016-09-27 MED ORDER — INSULIN DETEMIR 100 UNIT/ML ~~LOC~~ SOLN
15.0000 [IU] | Freq: Every day | SUBCUTANEOUS | Status: DC
  Administered 2016-09-27: 15 [IU] via SUBCUTANEOUS
  Filled 2016-09-27 (×2): qty 0.15

## 2016-09-27 MED ORDER — SODIUM CHLORIDE 0.9 % IV SOLN
INTRAVENOUS | Status: DC
  Administered 2016-09-27: 22:00:00 via INTRAVENOUS

## 2016-09-27 MED ORDER — SODIUM CHLORIDE 0.9 % IV BOLUS (SEPSIS): 1000.0000 mL | Freq: Once | INTRAVENOUS | Status: DC

## 2016-09-27 MED ORDER — INSULIN ASPART 100 UNIT/ML ~~LOC~~ SOLN
0.0000 [IU] | Freq: Three times a day (TID) | SUBCUTANEOUS | Status: DC
  Administered 2016-09-28: 2 [IU] via SUBCUTANEOUS
  Administered 2016-09-28: 5 [IU] via SUBCUTANEOUS
  Administered 2016-09-29: 2 [IU] via SUBCUTANEOUS
  Administered 2016-09-29: 11 [IU] via SUBCUTANEOUS

## 2016-09-27 MED ORDER — DIPHENHYDRAMINE HCL 50 MG/ML IJ SOLN
12.5000 mg | Freq: Once | INTRAMUSCULAR | Status: AC
  Administered 2016-09-27: 12.5 mg via INTRAVENOUS
  Filled 2016-09-27: qty 1

## 2016-09-27 MED ORDER — ENOXAPARIN SODIUM 40 MG/0.4ML ~~LOC~~ SOLN
40.0000 mg | SUBCUTANEOUS | Status: DC
  Administered 2016-09-27 – 2016-09-28 (×2): 40 mg via SUBCUTANEOUS
  Filled 2016-09-27 (×2): qty 0.4

## 2016-09-27 MED ORDER — HALOPERIDOL LACTATE 5 MG/ML IJ SOLN
5.0000 mg | Freq: Once | INTRAMUSCULAR | Status: DC
  Filled 2016-09-27: qty 1

## 2016-09-27 MED ORDER — METOCLOPRAMIDE HCL 5 MG/ML IJ SOLN
10.0000 mg | Freq: Three times a day (TID) | INTRAMUSCULAR | Status: DC
  Administered 2016-09-27 – 2016-09-29 (×6): 10 mg via INTRAVENOUS
  Filled 2016-09-27 (×6): qty 2

## 2016-09-27 MED ORDER — SODIUM CHLORIDE 0.9 % IV BOLUS (SEPSIS)
1000.0000 mL | Freq: Once | INTRAVENOUS | Status: AC
  Administered 2016-09-27: 1000 mL via INTRAVENOUS

## 2016-09-27 MED ORDER — FAMOTIDINE IN NACL 20-0.9 MG/50ML-% IV SOLN
20.0000 mg | Freq: Once | INTRAVENOUS | Status: AC
  Administered 2016-09-27: 20 mg via INTRAVENOUS
  Filled 2016-09-27: qty 50

## 2016-09-27 MED ORDER — DIPHENHYDRAMINE HCL 50 MG/ML IJ SOLN
25.0000 mg | Freq: Once | INTRAMUSCULAR | Status: AC
  Administered 2016-09-27: 25 mg via INTRAVENOUS
  Filled 2016-09-27: qty 1

## 2016-09-27 MED ORDER — ONDANSETRON HCL 4 MG/2ML IJ SOLN: 4.0000 mg | Freq: Three times a day (TID) | INTRAMUSCULAR | Status: AC | PRN

## 2016-09-27 NOTE — ED Notes (Signed)
WARM COMPRESS APPLIED TO IV SITE. EDPA WILL MADE AWARE OF LOSS OF IV. NEW ORDER FOR HALDOL. AWAITING ORDER

## 2016-09-27 NOTE — ED Triage Notes (Addendum)
BIB EMS from Home, w/ c/o n/v and diarrhea. Pt has hx of Gastroparesis.    EMS Vitals BP 175/103 P 115 SPO2 100% CBG 216

## 2016-09-27 NOTE — ED Notes (Signed)
EDPA Provider at bedside. 

## 2016-09-27 NOTE — ED Notes (Signed)
Upon arrival to room. Pt states she defecated and urinated on herself. Pt actively vomiting. Light clear fluid in emesis bag appox 200 collected. Pt anxious and stating a HX of the same. Continued with pt care as pt requested stating she was cold.

## 2016-09-27 NOTE — H&P (Signed)
History and Physical    [name] EYC:[id number] DOB: Jan 29, 1966  DOA: 09/27/2016 PCP: [name], MD  Patient coming from: Home   Chief Complaint: Nausea and vomiting   HPI: [name] is a 51 y.o. female with medical history significant of DM type 2, gastroparesis and GERD. Presented to the ER c/o nausea and vomiting, secondary to gastroparesis. Symptoms began this morning with 2-3 episodes of NBNB emesis. Associated symptoms include diarrhea 3-5 episodes, non bloody.No other complains.   Patient was treated in the ED with Zofran,haldol, ativan and Reglan with no significant improvement. Patient was given 3L NS and GI panel was sent. [place] were perform which she immediately vomited.   General: no changes in body weight, no fever chills or decrease in energy.  HEENT: no blurry vision, hearing changes or sore throat Respiratory: no dyspnea, coughing, wheezing CV: no chest pain, no palpitations GI: See HPI  GU: no dysuria, burning on urination, increased urinary frequency, hematuria  Ext:. No deformities,  Neuro: no unilateral weakness, numbness, or tingling, no vision change or hearing loss Skin: No rashes, lesions or wounds. MSK: No muscle spasm, no deformity, no limitation of range of movement in spin Heme: No easy bruising.  Travel history: No recent long distant travel.   Past Medical History:  Diagnosis Date  . Depression   . Diabetes mellitus without complication ([place])   . Essential hypertension   . Gastroparesis   . GERD (gastroesophageal reflux disease)   . HLD (hyperlipidemia)     Past Surgical History:  Procedure Laterality Date  . COLONOSCOPY  09/27/2014   at [facility]  . ESOPHAGOGASTRODUODENOSCOPY  09/27/2014   at [facility], Dr Rolan Lipa. biospy neg for celiac, neg for H pylori.   . EYE SURGERY    . gailstones    . IR GENERIC HISTORICAL  01/24/2016   IR FLUORO GUIDE CV LINE RIGHT 01/24/2016 Darrell K Allred, PA-C [facility]  . IR GENERIC  HISTORICAL  01/24/2016   IR US GUIDE VASC ACCESS RIGHT 01/24/2016 Darrell K Allred, PA-C [facility]  . POSTERIOR VITRECTOMY AND MEMBRANE PEEL-LEFT EYE  09/28/2002  . POSTERIOR VITRECTOMY AND MEMBRANE PEEL-RIGHT EYE  03/16/2002  . RETINAL DETACHMENT SURGERY       reports that she has never smoked. She has never used smokeless tobacco. She reports that she does not drink alcohol or use drugs.  Allergies  Allergen Reactions  . Anesthetics, Amide Nausea And Vomiting  . Penicillins Diarrhea and Nausea And Vomiting    Has patient had a PCN reaction causing immediate rash, facial/tongue/throat swelling, SOB or lightheadedness with hypotension: No Has patient had a PCN reaction causing severe rash involving mucus membranes or skin necrosis: No Has patient had a PCN reaction that required hospitalization No Has patient had a PCN reaction occurring within the last 10 years: Yes  If all of the above answers are "NO", then may proceed with Cephalosporin use.   . Buprenorphine Hcl Rash  . Encainide Nausea And Vomiting    Family History  Problem Relation Age of Onset  . Cystic fibrosis Mother   . Hypertension Father   . Diabetes Brother   . Hypertension Maternal Grandmother     Prior to Admission medications   Medication Sig Start Date End Date Taking? Authorizing Provider  atorvastatin (LIPITOR) 40 MG tablet Take 1 tablet (40 mg total) by mouth daily. 09/04/16  Yes [name], MD  Blood Glucose Monitoring Suppl (TRUE METRIX METER) DEVI 1 each by Does not  apply route 3 (three) times daily before meals. 07/19/15  Yes [name], MD  busPIRone (BUSPAR) 10 MG tablet Take 1 tablet (10 mg total) by mouth 2 (two) times daily. 09/04/16  Yes [name], MD  FLUoxetine (PROZAC) 20 MG tablet Take 2 tablets (40 mg total) by mouth daily. 09/04/16  Yes [name], MD  furosemide (LASIX) 40 MG tablet Take 40 mg by mouth daily as needed for fluid.    Yes Historical Provider, MD  glucose blood (TRUE METRIX  BLOOD GLUCOSE TEST) test strip Use 3 times daily before meals 03/13/16  Yes [name], MD  insulin aspart (NOVOLOG) 100 UNIT/ML injection Inject 0-15 Units into the skin 3 (three) times daily with meals. Per sliding scale 09/04/16  Yes [name], MD  insulin detemir (LEVEMIR) 100 UNIT/ML injection Inject 0.15 mLs (15 Units total) into the skin 2 (two) times daily. 09/04/16  Yes [name], MD  Insulin Syringe-Needle U-100 (BD INSULIN SYRINGE ULTRAFINE) 31G X 15/64" 0.5 ML MISC 1 each by Does not apply route 4 (four) times daily as needed. 08/16/15  Yes [name], MD  metoCLOPramide (REGLAN) 10 MG tablet Take 1 tablet (10 mg total) by mouth 3 (three) times daily before meals. 03/26/16  Yes Donne Hazel, MD  ondansetron (ZOFRAN ODT) 4 MG disintegrating tablet Take 1 tablet (4 mg total) by mouth every 8 (eight) hours as needed for nausea or vomiting. 09/04/16  Yes [name], MD  pantoprazole (PROTONIX) 40 MG tablet Take 1 tablet (40 mg total) by mouth daily. 05/09/16  Yes [name], MD  polyethylene glycol (MIRALAX / GLYCOLAX) packet Take 17 g by mouth daily as needed for mild constipation. 09/04/16  Yes [name], MD  pregabalin (LYRICA) 150 MG capsule Take 1 capsule (150 mg total) by mouth 2 (two) times daily. 09/04/16  Yes [name], MD  albuterol (PROVENTIL HFA;VENTOLIN HFA) 108 (90 Base) MCG/ACT inhaler Inhale 2 puffs into the lungs every 6 (six) hours as needed for wheezing or shortness of breath. Patient not taking: Reported on 09/04/2016 10/20/15   [name], MD  docusate sodium (COLACE) 250 MG capsule Take 1 capsule (250 mg total) by mouth 2 (two) times daily. Patient not taking: Reported on 09/27/2016 03/26/16   Donne Hazel, MD  ferrous sulfate 325 (65 FE) MG tablet Take 1 tablet (325 mg total) by mouth daily with breakfast. Patient not taking: Reported on 09/04/2016 03/27/16   Donne Hazel, MD    Physical Exam: Vitals:   09/27/16 1530 09/27/16 1543 09/27/16 1707 09/27/16  1753  BP: (!) 166/68 (!) 166/68 (!) 164/65   Pulse: (!) 112 (!) 112 (!) 113   Resp:  18 18   Temp:  99.5 F (37.5 C) 98.4 F (36.9 C)   TempSrc:  Oral Oral   SpO2: 98% 100% 98%   Weight:    61.1 kg (134 lb 11.2 oz)  Height:    5' 1.5" (1.562 m)     Constitutional: NAD, calm, comfortable Eyes: PERRL, lids and conjunctivae normal ENMT: Mucous membranes are moist. Neck: normal, supple, no masses, no thyromegaly Respiratory: clear to auscultation bilaterally, no wheezing, no crackles. Normal respiratory effort. Cardiovascular: Tachy @ 110 , no murmurs / rubs / gallops. No extremity edema. 2+ pedal pulses. Abdomen: Abdomen soft, mild diffuse tenderness, Bowel sounds positive.  Musculoskeletal: no clubbing / cyanosis. No joint deformity upper and lower extremities Skin: no rashes, lesions, ulcers. No induration  Labs on Admission: I have personally  reviewed following labs and imaging studies  CBC:  Recent Labs Lab 09/27/16 1123  WBC 11.7*  NEUTROABS 10.5*  HGB 11.6*  HCT 36.3  MCV 75.0*  PLT 001*   Basic Metabolic Panel:  Recent Labs Lab 09/27/16 1123  NA 135  K 4.2  CL 102  CO2 17*  GLUCOSE 317*  BUN 15  CREATININE 1.04*  CALCIUM 9.6   GFR: Estimated Creatinine Clearance: 55.6 mL/min (A) (by C-G formula based on SCr of 1.04 mg/dL (H)). Liver Function Tests:  Recent Labs Lab 09/27/16 1123  AST 27  ALT 14  ALKPHOS 92  BILITOT 1.4*  PROT 8.7*  ALBUMIN 4.3    Recent Labs Lab 09/27/16 1123  LIPASE 22   No results for input(s): AMMONIA in the last 168 hours. Coagulation Profile: No results for input(s): INR, PROTIME in the last 168 hours. Cardiac Enzymes: No results for input(s): CKTOTAL, CKMB, CKMBINDEX, TROPONINI in the last 168 hours. BNP (last 3 results) No results for input(s): PROBNP in the last 8760 hours. HbA1C: No results for input(s): HGBA1C in the last 72 hours. CBG:  Recent Labs Lab 09/27/16 1050  GLUCAP 288*   Lipid Profile: No  results for input(s): CHOL, HDL, LDLCALC, TRIG, CHOLHDL, LDLDIRECT in the last 72 hours. Thyroid Function Tests: No results for input(s): TSH, T4TOTAL, FREET4, T3FREE, THYROIDAB in the last 72 hours. Anemia Panel: No results for input(s): VITAMINB12, FOLATE, FERRITIN, TIBC, IRON, RETICCTPCT in the last 72 hours. Urine analysis:    Component Value Date/Time   COLORURINE STRAW (A) 09/27/2016 1352   APPEARANCEUR CLEAR 09/27/2016 1352   LABSPEC 1.018 09/27/2016 1352   PHURINE 6.0 09/27/2016 1352   GLUCOSEU >=500 (A) 09/27/2016 1352   HGBUR SMALL (A) 09/27/2016 1352   BILIRUBINUR NEGATIVE 09/27/2016 1352   BILIRUBINUR NEGATIVE 08/04/2012 1005   KETONESUR 80 (A) 09/27/2016 1352   PROTEINUR 100 (A) 09/27/2016 1352   UROBILINOGEN 0.2 04/27/2015 1608   NITRITE NEGATIVE 09/27/2016 1352   LEUKOCYTESUR NEGATIVE 09/27/2016 1352   Radiological Exams on Admission: No results found.  EKG: Ordered   Assessment/Plan Diabetic Gastroparesis - failed PO trials, Pt received, Benadryl 50mg , Haldol, Ativan,  Zofran and phenergan.  Admit to medsurg  Continue IVF  Reglan IV TID  Will add IV erythromycin  Zofran PRN  NPO  Glucose control   DM type 2 with mild anion gap  Continue levemir 15 daily decrease dose in half as pt NPO although blood glucose elevated with mild elevated gap  Will hold pre meal insulin  SSI CBG monitoring   HTN  Hold oral antihypertensive   Will add hydralazine PRN for SBP > 160  GERD  Will continue Pepcid IV   DVT prophylaxis: Lovenox  Code Status: FULL  Family Communication: None at bedside  Disposition Plan: Anticipate discharge to previous home environment.  Consults called: None  Admission status: Obs/Medsurg    Chipper Oman MD [facility] Pager: Text Page via [email or web address]  [phone]  If 7PM-7AM, please contact night-coverage [email or web address] Password [facility]  09/27/2016, 6:35 PM

## 2016-09-27 NOTE — ED Notes (Signed)
Bed: OY77 Expected date:  Expected time:  Means of arrival:  Comments: EMS-gastroparesis

## 2016-09-27 NOTE — ED Notes (Signed)
EDPA AWARE OF MUCOUS LIKE STOOL COLLECT AND PT HX OF C-DIFF

## 2016-09-27 NOTE — ED Provider Notes (Signed)
[facility] DEPT Provider Note   CSN: [id number] Arrival date & time: 09/27/16  6720     History   Chief Complaint Chief Complaint  Patient presents with  . Gastroparesis    n/v  . Diarrhea    HPI Dala Labarbera is a 51 y.o. female.  Amadi Justen is a 51 y.o. Female with a history of diabetes, and gastroparesis, who presents the emergency department complaining of nausea, vomiting, diarrhea and abdominal pain since yesterday. She complains of generalized abdominal pain, and multiple episodes of nausea and vomiting. She reports this feels like her gastroparesis. She has not been able to keep down any of her medications today. She also reports several episodes of diarrhea today. She denies fevers, hematemesis, hematochezia, urinary symptoms, rashes.    The history is provided by the patient and medical records. No language interpreter was used.  Diarrhea   Associated symptoms include abdominal pain and vomiting. Pertinent negatives include no chills, no headaches and no cough.    Past Medical History:  Diagnosis Date  . Depression   . Diabetes mellitus without complication ([place])   . Essential hypertension   . Gastroparesis   . GERD (gastroesophageal reflux disease)   . HLD (hyperlipidemia)     Patient Active Problem List   Diagnosis Date Noted  . Gastroparesis 04/20/2016  . Type 1 diabetes mellitus with ketoacidosis without coma ([place])   . DKA (diabetic ketoacidoses) ([place]) 03/18/2016  . Noncompliance with treatment plan 03/13/2016  . Essential hypertension 01/24/2016  . Leukocytosis 01/24/2016  . Abnormal ECG 11/27/2015  . HLD (hyperlipidemia)   . GERD (gastroesophageal reflux disease)   . Metabolic acidosis, normal anion gap (NAG)   . AKI (acute kidney injury) ([place])   . Anemia, iron deficiency   . DKA, type 1, not at goal [facility])   . Hyponatremia 09/02/2015  . Vitamin B12 deficiency 08/16/2015  . Lactic acidosis 07/29/2015  . Acute pyelonephritis 07/29/2015  .  Pyelonephritis   . Hypothermia   . Diabetic gastroparesis ([place])   . Diabetic neuropathy, type I diabetes mellitus ([place]) 05/18/2015  . Anemia 05/18/2015  . Anxiety and depression 05/18/2015  . Nausea & vomiting   . Diabetic ketoacidosis without coma associated with type 1 diabetes mellitus ([place])   . High anion gap metabolic acidosis 94/70/9628    Past Surgical History:  Procedure Laterality Date  . COLONOSCOPY  09/27/2014   at [facility]  . ESOPHAGOGASTRODUODENOSCOPY  09/27/2014   at [facility], Dr Rolan Lipa. biospy neg for celiac, neg for H pylori.   . EYE SURGERY    . gailstones    . IR GENERIC HISTORICAL  01/24/2016   IR FLUORO GUIDE CV LINE RIGHT 01/24/2016 Darrell K Allred, PA-C [facility]INTER[facility]  . IR GENERIC HISTORICAL  01/24/2016   IR US GUIDE VASC ACCESS RIGHT 01/24/2016 Darrell K Allred, PA-C [facility]  . POSTERIOR VITRECTOMY AND MEMBRANE PEEL-LEFT EYE  09/28/2002  . POSTERIOR VITRECTOMY AND MEMBRANE PEEL-RIGHT EYE  03/16/2002  . RETINAL DETACHMENT SURGERY      OB History    No data available       Home Medications    Prior to Admission medications   Medication Sig Start Date End Date Taking? Authorizing Provider  atorvastatin (LIPITOR) 40 MG tablet Take 1 tablet (40 mg total) by mouth daily. 09/04/16  Yes [name], MD  Blood Glucose Monitoring Suppl (TRUE METRIX METER) DEVI 1 each by Does not apply route 3 (three) times daily before meals. 07/19/15  Yes  [name], MD  busPIRone (BUSPAR) 10 MG tablet Take 1 tablet (10 mg total) by mouth 2 (two) times daily. 09/04/16  Yes [name], MD  FLUoxetine (PROZAC) 20 MG tablet Take 2 tablets (40 mg total) by mouth daily. 09/04/16  Yes [name], MD  furosemide (LASIX) 40 MG tablet Take 40 mg by mouth daily as needed for fluid.    Yes Historical Provider, MD  glucose blood (TRUE METRIX BLOOD GLUCOSE TEST) test strip Use 3 times daily before meals 03/13/16  Yes [name], MD  insulin aspart (NOVOLOG) 100 UNIT/ML injection Inject  0-15 Units into the skin 3 (three) times daily with meals. Per sliding scale 09/04/16  Yes [name], MD  insulin detemir (LEVEMIR) 100 UNIT/ML injection Inject 0.15 mLs (15 Units total) into the skin 2 (two) times daily. 09/04/16  Yes [name], MD  Insulin Syringe-Needle U-100 (BD INSULIN SYRINGE ULTRAFINE) 31G X 15/64" 0.5 ML MISC 1 each by Does not apply route 4 (four) times daily as needed. 08/16/15  Yes [name], MD  metoCLOPramide (REGLAN) 10 MG tablet Take 1 tablet (10 mg total) by mouth 3 (three) times daily before meals. 03/26/16  Yes Donne Hazel, MD  ondansetron (ZOFRAN ODT) 4 MG disintegrating tablet Take 1 tablet (4 mg total) by mouth every 8 (eight) hours as needed for nausea or vomiting. 09/04/16  Yes [name], MD  pantoprazole (PROTONIX) 40 MG tablet Take 1 tablet (40 mg total) by mouth daily. 05/09/16  Yes [name], MD  polyethylene glycol (MIRALAX / GLYCOLAX) packet Take 17 g by mouth daily as needed for mild constipation. 09/04/16  Yes [name], MD  pregabalin (LYRICA) 150 MG capsule Take 1 capsule (150 mg total) by mouth 2 (two) times daily. 09/04/16  Yes [name], MD  albuterol (PROVENTIL HFA;VENTOLIN HFA) 108 (90 Base) MCG/ACT inhaler Inhale 2 puffs into the lungs every 6 (six) hours as needed for wheezing or shortness of breath. Patient not taking: Reported on 09/04/2016 10/20/15   [name], MD  docusate sodium (COLACE) 250 MG capsule Take 1 capsule (250 mg total) by mouth 2 (two) times daily. Patient not taking: Reported on 09/27/2016 03/26/16   Donne Hazel, MD  ferrous sulfate 325 (65 FE) MG tablet Take 1 tablet (325 mg total) by mouth daily with breakfast. Patient not taking: Reported on 09/04/2016 03/27/16   Donne Hazel, MD    Family History Family History  Problem Relation Age of Onset  . Cystic fibrosis Mother   . Hypertension Father   . Diabetes Brother   . Hypertension Maternal Grandmother     Social History Social History  Substance  Use Topics  . Smoking status: Never Smoker  . Smokeless tobacco: Never Used  . Alcohol use No     Allergies   Anesthetics, amide; Penicillins; Buprenorphine hcl; and Encainide   Review of Systems Review of Systems  Constitutional: Negative for chills and fever.  HENT: Negative for congestion and sore throat.   Eyes: Negative for visual disturbance.  Respiratory: Negative for cough and shortness of breath.   Cardiovascular: Negative for chest pain.  Gastrointestinal: Positive for abdominal pain, diarrhea, nausea and vomiting. Negative for abdominal distention and blood in stool.  Genitourinary: Negative for difficulty urinating and dysuria.  Musculoskeletal: Negative for back pain and neck pain.  Skin: Negative for rash.  Neurological: Negative for light-headedness and headaches.     Physical Exam Updated Vital Signs BP (!) 166/68 (BP Location: Right Arm)  Pulse (!) 112   Temp 99.5 F (37.5 C) (Oral)   Resp 18   Ht 5\' 2"  (1.575 m)   Wt 66.7 kg   LMP 09/04/2016   SpO2 100%   BMI 26.89 kg/m   Physical Exam  Constitutional: She appears well-developed and well-nourished. No distress.  Nontoxic appearing. Appears uncomfortable. Retching in the room.  HENT:  Head: Normocephalic and atraumatic.  Mouth/Throat: Oropharynx is clear and moist.  Mucous membranes are moist.  Eyes: Conjunctivae are normal. Pupils are equal, round, and reactive to light. Right eye exhibits no discharge. Left eye exhibits no discharge.  Neck: Neck supple.  Cardiovascular: Regular rhythm, normal heart sounds and intact distal pulses.  Exam reveals no gallop and no friction rub.   No murmur heard. Heart rate is 108.  Pulmonary/Chest: Effort normal and breath sounds normal. No respiratory distress. She has no wheezes. She has no rales.  Abdominal: Soft. Bowel sounds are normal. She exhibits no distension and no mass. There is tenderness. There is no rebound and no guarding.  Abdomen is soft. Bowel  sounds are present. Patient has generalized abdominal tenderness to palpation without focal tenderness. Peritoneal signs.  Musculoskeletal: She exhibits no edema.  Lymphadenopathy:    She has no cervical adenopathy.  Neurological: She is alert. Coordination normal.  Skin: Skin is warm and dry. Capillary refill takes less than 2 seconds. No rash noted. She is not diaphoretic. No erythema. No pallor.  Psychiatric: She has a normal mood and affect. Her behavior is normal.  Nursing note and vitals reviewed.    ED Treatments / Results  Labs (all labs ordered are listed, but only abnormal results are displayed) Labs Reviewed  COMPREHENSIVE METABOLIC PANEL - Abnormal; Notable for the following:       Result Value   CO2 17 (*)    Glucose, Bld 317 (*)    Creatinine, Ser 1.04 (*)    Total Protein 8.7 (*)    Total Bilirubin 1.4 (*)    Anion gap 16 (*)    All other components within normal limits  CBC WITH DIFFERENTIAL/PLATELET - Abnormal; Notable for the following:    WBC 11.7 (*)    Hemoglobin 11.6 (*)    MCV 75.0 (*)    MCH 24.0 (*)    Platelets 457 (*)    Neutro Abs 10.5 (*)    All other components within normal limits  URINALYSIS, ROUTINE W REFLEX MICROSCOPIC - Abnormal; Notable for the following:    Color, Urine STRAW (*)    Glucose, UA >=500 (*)    Hgb urine dipstick SMALL (*)    Ketones, ur 80 (*)    Protein, ur 100 (*)    Squamous Epithelial / LPF 0-5 (*)    All other components within normal limits  BLOOD GAS, VENOUS - Abnormal; Notable for the following:    pCO2, Ven 27.2 (*)    Bicarbonate 15.5 (*)    Acid-base deficit 8.1 (*)    All other components within normal limits  CBG MONITORING, ED - Abnormal; Notable for the following:    Glucose-Capillary 288 (*)    All other components within normal limits  GASTROINTESTINAL PANEL BY PCR, STOOL (REPLACES STOOL CULTURE)  LIPASE, BLOOD  I-STAT BETA HCG BLOOD, ED ([facility], [facility], [facility] ONLY)  I-STAT VENOUS BLOOD GAS, ED    EKG  EKG  Interpretation None       Radiology No results found.  Procedures Procedures (including critical care time)  Medications Ordered  in ED Medications  sodium chloride 0.9 % bolus 1,000 mL (not administered)  sodium chloride 0.9 % bolus 1,000 mL (not administered)  ondansetron (ZOFRAN) injection 4 mg (not administered)  diphenhydrAMINE (BENADRYL) injection 12.5 mg (not administered)  promethazine (PHENERGAN) injection 25 mg (not administered)  famotidine (PEPCID) IVPB 20 mg premix (not administered)  LORazepam (ATIVAN) tablet 1 mg (1 mg Oral Given 09/27/16 1138)  metoCLOPramide (REGLAN) injection 10 mg (10 mg Intravenous Given 09/27/16 1139)  diphenhydrAMINE (BENADRYL) injection 25 mg (25 mg Intravenous Given 09/27/16 1139)  haloperidol lactate (HALDOL) injection 2 mg (2 mg Intramuscular Given 09/27/16 1447)     Initial Impression / Assessment and Plan / ED Course  I have reviewed the triage vital signs and the nursing notes.  Pertinent labs & imaging results that were available during my care of the patient were reviewed by me and considered in my medical decision making (see chart for details).    This  is a 51 y.o. Female with a history of diabetes, and gastroparesis, who presents the emergency department complaining of nausea, vomiting, diarrhea and abdominal pain since yesterday. She complains of generalized abdominal pain, and multiple episodes of nausea and vomiting. She reports this feels like her gastroparesis. She has not been able to keep down any of her medications today. She also reports several episodes of diarrhea today. On exam the patient is afebrile nontoxic appearing. She is retching in the room and appears anxious. Her abdomen is soft and she has generalized abdominal tenderness to palpation without focal tenderness. Initially patient reports she thinks she can tolerate some by mouth Ativan to help with her symptoms. We'll provide her with this, Reglan and Benadryl twice a  day and reevaluate. Patient was difficult stick and kept on her Ativan briefly and then vomited this back up. IV was able to be obtained she received fluid bolus, Reglan and Benadryl. Landed short while later patient was feeling better, but then vomits. Urinalysis shows no sign of infection. It does show glucosuria. CBC shows white count 11,700. CMP reveals a creatinine on her baseline of 1.04. Glucose is 317 with a bicarbonate of 17 and anion gap of 16. Lipase is WNL. Patient receiving fluids.  We will provide with Haldol and reevaluate.  At evaluation after Haldol patient is no longer retching nor rocking in the bed. She initially does not want to trial by mouth. She does agree to trial my mouth and then vomits clearish liquid. Abdominal exam is benign.  Patient is still not able to tolerate PO. Will plan for admission.   I consulted with [facility] hospitalist who accepted the patient for admission.      Final Clinical Impressions(s) / ED Diagnoses   Final diagnoses:  Gastroparesis due to DM (HCC)  Nausea vomiting and diarrhea  Hyperglycemia    New Prescriptions New Prescriptions   No medications on file     Waynetta Pean, PA-C 09/27/16 [place], MD 09/29/16 1423

## 2016-09-27 NOTE — ED Notes (Addendum)
EDPA MADE AWARE OF ONLY ONE IV ACCESS. RATE OF CURRENT BOLUS IV INFUSION. THAT ONLY ONE BOLUS CAN INFUSE. PT VOMITED ONLY ONCE SINCE MEDICATION WAS GIVEN AND PT VOMITED AT EDPA ARRIVAL HOWEVER PT WAS ABLE TO REMAIN STILL AND WITHOUT VOMITING DURING ASSESSMENT AND INSERTION OF IVUS. EDPA STATES NO NEED FOR 2ND IV AT THIS TIME.

## 2016-09-27 NOTE — ED Notes (Signed)
JAKE RN AT BEDSIDE ATTEMPTING IVUS ACCESS

## 2016-09-27 NOTE — ED Notes (Signed)
ATTEMPT 2ND LINE- NOT SUCCESSFUL EDPA AWARE

## 2016-09-27 NOTE — ED Notes (Signed)
RN AWARE OF 3RD LITER FLUIDS PENDING. ENTERIC PRECAUTIONS IN ED. TELEMETRY STATUS.

## 2016-09-28 DIAGNOSIS — E785 Hyperlipidemia, unspecified: Secondary | ICD-10-CM | POA: Diagnosis present

## 2016-09-28 DIAGNOSIS — R739 Hyperglycemia, unspecified: Secondary | ICD-10-CM | POA: Diagnosis not present

## 2016-09-28 DIAGNOSIS — I10 Essential (primary) hypertension: Secondary | ICD-10-CM | POA: Diagnosis present

## 2016-09-28 DIAGNOSIS — K219 Gastro-esophageal reflux disease without esophagitis: Secondary | ICD-10-CM | POA: Diagnosis present

## 2016-09-28 DIAGNOSIS — F329 Major depressive disorder, single episode, unspecified: Secondary | ICD-10-CM | POA: Diagnosis present

## 2016-09-28 DIAGNOSIS — Z794 Long term (current) use of insulin: Secondary | ICD-10-CM | POA: Diagnosis not present

## 2016-09-28 DIAGNOSIS — K3184 Gastroparesis: Secondary | ICD-10-CM | POA: Diagnosis present

## 2016-09-28 DIAGNOSIS — E11649 Type 2 diabetes mellitus with hypoglycemia without coma: Secondary | ICD-10-CM | POA: Diagnosis present

## 2016-09-28 DIAGNOSIS — Z79899 Other long term (current) drug therapy: Secondary | ICD-10-CM | POA: Diagnosis not present

## 2016-09-28 DIAGNOSIS — E1143 Type 2 diabetes mellitus with diabetic autonomic (poly)neuropathy: Secondary | ICD-10-CM | POA: Diagnosis present

## 2016-09-28 DIAGNOSIS — Z880 Allergy status to penicillin: Secondary | ICD-10-CM | POA: Diagnosis not present

## 2016-09-28 DIAGNOSIS — E1110 Type 2 diabetes mellitus with ketoacidosis without coma: Secondary | ICD-10-CM | POA: Diagnosis present

## 2016-09-28 DIAGNOSIS — Z8249 Family history of ischemic heart disease and other diseases of the circulatory system: Secondary | ICD-10-CM | POA: Diagnosis not present

## 2016-09-28 DIAGNOSIS — Z833 Family history of diabetes mellitus: Secondary | ICD-10-CM | POA: Diagnosis not present

## 2016-09-28 DIAGNOSIS — Z888 Allergy status to other drugs, medicaments and biological substances status: Secondary | ICD-10-CM | POA: Diagnosis not present

## 2016-09-28 LAB — HEMOGLOBIN A1C
Hgb A1c MFr Bld: 10 % — ABNORMAL HIGH (ref 4.8–5.6)
Mean Plasma Glucose: 240 mg/dL

## 2016-09-28 LAB — BASIC METABOLIC PANEL
Anion gap: 19 — ABNORMAL HIGH (ref 5–15)
Anion gap: 7 (ref 5–15)
BUN: 19 mg/dL (ref 6–20)
BUN: 25 mg/dL — AB (ref 6–20)
CALCIUM: 8.4 mg/dL — AB (ref 8.9–10.3)
CO2: 9 mmol/L — ABNORMAL LOW (ref 22–32)
CO2: 17 mmol/L — AB (ref 22–32)
Calcium: 9 mg/dL (ref 8.9–10.3)
Chloride: 111 mmol/L (ref 101–111)
Chloride: 115 mmol/L — ABNORMAL HIGH (ref 101–111)
Creatinine, Ser: 1.54 mg/dL — ABNORMAL HIGH (ref 0.44–1.00)
Creatinine, Ser: 1.48 mg/dL — ABNORMAL HIGH (ref 0.44–1.00)
GFR calc Af Amer: 45 mL/min — ABNORMAL LOW (ref >60)
GFR calc Af Amer: 47 mL/min — ABNORMAL LOW (ref >60)
GFR calc non Af Amer: 39 mL/min — ABNORMAL LOW (ref >60)
GFR calc non Af Amer: 40 mL/min — ABNORMAL LOW (ref >60)
GLUCOSE: 296 mg/dL — AB (ref 65–99)
Glucose, Bld: 526 mg/dL (ref 65–99)
POTASSIUM: 4.1 mmol/L (ref 3.5–5.1)
Potassium: 5 mmol/L (ref 3.5–5.1)
Sodium: 139 mmol/L (ref 135–145)
Sodium: 139 mmol/L (ref 135–145)

## 2016-09-28 LAB — GASTROINTESTINAL PANEL BY PCR, STOOL (REPLACES STOOL CULTURE)
ASTROVIRUS: NOT DETECTED
Adenovirus F40/41: NOT DETECTED
CRYPTOSPORIDIUM: NOT DETECTED
CYCLOSPORA CAYETANENSIS: NOT DETECTED
Campylobacter species: NOT DETECTED
ENTEROTOXIGENIC E COLI (ETEC): NOT DETECTED
Entamoeba histolytica: NOT DETECTED
Enteroaggregative E coli (EAEC): NOT DETECTED
Enteropathogenic E coli (EPEC): NOT DETECTED
Giardia lamblia: NOT DETECTED
Norovirus GI/GII: NOT DETECTED
PLESIMONAS SHIGELLOIDES: NOT DETECTED
Rotavirus A: NOT DETECTED
SALMONELLA SPECIES: NOT DETECTED
SAPOVIRUS (I, II, IV, AND V): NOT DETECTED
SHIGA LIKE TOXIN PRODUCING E COLI (STEC): NOT DETECTED
SHIGELLA/ENTEROINVASIVE E COLI (EIEC): NOT DETECTED
VIBRIO CHOLERAE: NOT DETECTED
VIBRIO SPECIES: NOT DETECTED
YERSINIA ENTEROCOLITICA: NOT DETECTED

## 2016-09-28 LAB — COMPREHENSIVE METABOLIC PANEL
ALK PHOS: 90 U/L (ref 38–126)
ALT: 14 U/L (ref 14–54)
ANION GAP: 15 (ref 5–15)
AST: 23 U/L (ref 15–41)
Albumin: 4 g/dL (ref 3.5–5.0)
BUN: 19 mg/dL (ref 6–20)
CO2: 13 mmol/L — ABNORMAL LOW (ref 22–32)
CREATININE: 1.62 mg/dL — AB (ref 0.44–1.00)
Calcium: 9.2 mg/dL (ref 8.9–10.3)
Chloride: 115 mmol/L — ABNORMAL HIGH (ref 101–111)
GFR, EST AFRICAN AMERICAN: 42 mL/min — AB (ref >60)
GFR, EST NON AFRICAN AMERICAN: 36 mL/min — AB (ref >60)
Glucose, Bld: 293 mg/dL — ABNORMAL HIGH (ref 65–99)
Potassium: 4.5 mmol/L (ref 3.5–5.1)
Sodium: 143 mmol/L (ref 135–145)
Total Bilirubin: 1.4 mg/dL — ABNORMAL HIGH (ref 0.3–1.2)
Total Protein: 8.5 g/dL — ABNORMAL HIGH (ref 6.5–8.1)

## 2016-09-28 LAB — CBC
HCT: 36.6 % (ref 36.0–46.0)
HEMOGLOBIN: 11.5 g/dL — AB (ref 12.0–15.0)
MCH: 24.8 pg — AB (ref 26.0–34.0)
MCHC: 31.4 g/dL (ref 30.0–36.0)
MCV: 78.9 fL (ref 78.0–100.0)
PLATELETS: 353 10*3/uL (ref 150–400)
RBC: 4.64 MIL/uL (ref 3.87–5.11)
RDW: 15.2 % (ref 11.5–15.5)
WBC: 16.4 10*3/uL — ABNORMAL HIGH (ref 4.0–10.5)

## 2016-09-28 LAB — GLUCOSE, CAPILLARY
GLUCOSE-CAPILLARY: 489 mg/dL — AB (ref 65–99)
GLUCOSE-CAPILLARY: 181 mg/dL — AB (ref 65–99)
GLUCOSE-CAPILLARY: 239 mg/dL — AB (ref 65–99)
GLUCOSE-CAPILLARY: 51 mg/dL — AB (ref 65–99)
GLUCOSE-CAPILLARY: 109 mg/dL — AB (ref 65–99)
Glucose-Capillary: 141 mg/dL — ABNORMAL HIGH (ref 65–99)
Glucose-Capillary: 255 mg/dL — ABNORMAL HIGH (ref 65–99)

## 2016-09-28 LAB — HIV ANTIBODY (ROUTINE TESTING W REFLEX): HIV SCREEN 4TH GENERATION: NONREACTIVE

## 2016-09-28 MED ORDER — ONDANSETRON HCL 4 MG/2ML IJ SOLN
4.0000 mg | Freq: Four times a day (QID) | INTRAMUSCULAR | Status: DC | PRN
  Administered 2016-09-28: 4 mg via INTRAVENOUS
  Filled 2016-09-28: qty 2

## 2016-09-28 MED ORDER — MORPHINE SULFATE (PF) 4 MG/ML IV SOLN
1.0000 mg | INTRAVENOUS | Status: DC | PRN
  Administered 2016-09-28: 1 mg via INTRAVENOUS
  Filled 2016-09-28: qty 1

## 2016-09-28 MED ORDER — ERYTHROMYCIN BASE 250 MG PO TABS
250.0000 mg | ORAL_TABLET | Freq: Three times a day (TID) | ORAL | Status: DC
  Administered 2016-09-28 – 2016-09-29 (×5): 250 mg via ORAL
  Filled 2016-09-28 (×6): qty 1

## 2016-09-28 MED ORDER — BUSPIRONE HCL 10 MG PO TABS
10.0000 mg | ORAL_TABLET | Freq: Two times a day (BID) | ORAL | Status: DC
  Administered 2016-09-28 – 2016-09-29 (×3): 10 mg via ORAL
  Filled 2016-09-28 (×3): qty 1

## 2016-09-28 MED ORDER — PREGABALIN 75 MG PO CAPS
150.0000 mg | ORAL_CAPSULE | Freq: Two times a day (BID) | ORAL | Status: DC
  Administered 2016-09-28 – 2016-09-29 (×3): 150 mg via ORAL
  Filled 2016-09-28 (×3): qty 2

## 2016-09-28 MED ORDER — LORAZEPAM 2 MG/ML IJ SOLN: 1.0000 mg | Freq: Four times a day (QID) | INTRAMUSCULAR | Status: DC | PRN

## 2016-09-28 MED ORDER — DEXTROSE 50 % IV SOLN
1.0000 | Freq: Once | INTRAVENOUS | Status: AC
  Administered 2016-09-28: 50 mL via INTRAVENOUS
  Filled 2016-09-28: qty 50

## 2016-09-28 MED ORDER — INSULIN ASPART 100 UNIT/ML ~~LOC~~ SOLN
20.0000 [IU] | Freq: Once | SUBCUTANEOUS | Status: AC
  Administered 2016-09-28: 20 [IU] via SUBCUTANEOUS

## 2016-09-28 NOTE — Progress Notes (Signed)
Inpatient Diabetes Program Recommendations  AACE/ADA: New Consensus Statement on Inpatient Glycemic Control (2015)  Target Ranges:  Prepandial:   less than 140 mg/dL      Peak postprandial:   less than 180 mg/dL (1-2 hours)      Critically ill patients:  140 - 180 mg/dL   Lab Results  Component Value Date   GLUCAP 141 (H) 09/28/2016   HGBA1C 10.0 (H) 09/27/2016    Review of Glycemic Control  Diabetes history: Type 1 DM (since age 51) Outpatient Diabetes medications: Levemir 15 bid, Novolog 0-15 tid Current orders for Inpatient glycemic control: Levemir 15 units QHS, Novolog 0-15 units tidwc Received 30 units of Novolog last night for blood sugars in 400-500s.  CO2 - 13.  AG - 15  Admitting CO2 - 17. AG 16 on 4/5 at 1123. Pt is NPO. Continues to be nauseated.  Inpatient Diabetes Program Recommendations:    Change Novolog to 0-9 units Q4H (pt is Type 1 and very sensitive to insulin) Q4H while NPO.  BMET Q4H with CO2 low and glucose > 500 this morning.  Increase Levemir to 20 units QHS.  Needs close f/u with [facility] for diabetes management.  Thank you. Lorenda Peck, RD, LDN, CDE Inpatient Diabetes Coordinator [phone]

## 2016-09-28 NOTE — Progress Notes (Signed)
Hypoglycemic Event  CBG: 51  Treatment: pt npo; md notified  Symptoms: no symptoms  Follow-up CBG: Time: CBG Result:  Possible Reasons for Event: none  Comments/MD notified Dr. Quincy Simmonds paged    [name] [name]

## 2016-09-28 NOTE — Progress Notes (Signed)
PROGRESS NOTE [facility]   [name]   VQQ:[id number] DOB: 03/30/1966  DOA: 09/27/2016 PCP: [name], MD   Brief Narrative:  [name] is a 51 y.o. female with medical history significant of DM type 2, gastroparesis and GERD. Presented to the ER c/o nausea and vomiting, secondary to gastroparesis. Symptoms began this morning with 2-3 episodes of NBNB emesis. Associated symptoms include diarrhea 3-5 episodes, non bloody.No other complains.   Patient was treated in the ED with Zofran,haldol, ativan and Reglan with no significant improvement. Patient was given 3L NS and GI panel was sent. Water trail were perform which she immediately vomited.   Subjective: 4/6 Patient seen and examined. Appears fatigued and in significant discomfort. Continues to be nauseated, continues to remain NPO, requests ice chips. Reports mild abdominal pain. Denies diarrhea or vomiting today.  Assessment & Plan:   Diabetic Gastroparesis - failed PO trials, Pt received, Benadryl 50mg , Haldol, Ativan,  Zofran and phenergan.  Continue IVF  Reglan IV TID  Start erythromycin 250 mg po; national shortage on IV erythromycin per pharmacist Zofran PRN  NPO except sips w/meds and ice chips Glucose control   DM type 2 with mild anion gap - Improved  Continue levemir 15 daily decrease dose in half as pt NPO although blood glucose elevated with mild elevated gap  SSI Continue CBG monitoring   HTN  Hold oral antihypertensive   Will add hydralazine PRN for SBP > 160  GERD  Will continue Pepcid IV   DVT prophylaxis: Lovenox  Code Status: FULL  Family Communication: None at bedside  Disposition Plan: Anticipate discharge to previous home environment.  Consults called: None  Admission status: Obs/Medsurg    Objective: Vitals:   09/28/16 0524 09/28/16 0529 09/28/16 1421 09/28/16 1449  BP: (!) 144/51  (!) 95/50 (!) 104/52  Pulse: 63 (!) 110 (!) 104   Resp: 20 20 18    Temp: 99.8 F  (37.7 C) 98.4 F (36.9 C) 97.6 F (36.4 C)   TempSrc: Oral Oral Oral   SpO2: 90% 100% 100%   Weight:      Height:        Intake/Output Summary (Last 24 hours) at 09/28/16 1517 Last data filed at 09/28/16 0300  Gross per 24 hour  Intake             2780 ml  Output             1000 ml  Net             1780 ml   Filed Weights   09/27/16 1032 09/27/16 1753  Weight: 66.7 kg (147 lb) 61.1 kg (134 lb 11.2 oz)    Examination:  General exam: NAD, Appears calm and comfortable  Respiratory system: Clear to auscultation. No wheezes,crackle or rhonchi Cardiovascular system: S1 & S2 heard, RRR. No JVD, murmurs, rubs or gallops Gastrointestinal system: Abdomen is soft, mild diffuse tenderness. Bowel sounds heard. Central nervous system: Alert and oriented. No focal neurological deficits. Extremities: No pedal edema.  Skin: No rashes, lesions or ulcers Psychiatry: Judgement and insight appear normal. Mood & affect appropriate.   Data Reviewed: I have personally reviewed following labs and imaging studies  CBC:  Recent Labs Lab 09/27/16 1123 09/28/16 0650  WBC 11.7* 16.4*  NEUTROABS 10.5*  --   HGB 11.6* 11.5*  HCT 36.3 36.6  MCV 75.0* 78.9  PLT 457* 433   Basic Metabolic Panel:  Recent Labs Lab 09/27/16 1123 09/28/16 0220 09/28/16 2951  NA 135 139 143  K 4.2 5.0 4.5  CL 102 111 115*  CO2 17* 9* 13*  GLUCOSE 317* 526* 293*  BUN 15 19 19   CREATININE 1.04* 1.54* 1.62*  CALCIUM 9.6 9.0 9.2   GFR: Estimated Creatinine Clearance: 35.7 mL/min (A) (by C-G formula based on SCr of 1.62 mg/dL (H)). Liver Function Tests:  Recent Labs Lab 09/27/16 1123 09/28/16 0650  AST 27 23  ALT 14 14  ALKPHOS 92 90  BILITOT 1.4* 1.4*  PROT 8.7* 8.5*  ALBUMIN 4.3 4.0    Recent Labs Lab 09/27/16 1123  LIPASE 22   No results for input(s): AMMONIA in the last 168 hours. Coagulation Profile: No results for input(s): INR, PROTIME in the last 168 hours. Cardiac Enzymes: No  results for input(s): CKTOTAL, CKMB, CKMBINDEX, TROPONINI in the last 168 hours. BNP (last 3 results) No results for input(s): PROBNP in the last 8760 hours. HbA1C:  Recent Labs  09/27/16 1122  HGBA1C 10.0*   CBG:  Recent Labs Lab 09/27/16 2118 09/28/16 0116 09/28/16 0653 09/28/16 0725 09/28/16 1158  GLUCAP 432* 489* 181* 239* 141*   Lipid Profile: No results for input(s): CHOL, HDL, LDLCALC, TRIG, CHOLHDL, LDLDIRECT in the last 72 hours. Thyroid Function Tests: No results for input(s): TSH, T4TOTAL, FREET4, T3FREE, THYROIDAB in the last 72 hours. Anemia Panel: No results for input(s): VITAMINB12, FOLATE, FERRITIN, TIBC, IRON, RETICCTPCT in the last 72 hours. Sepsis Labs: No results for input(s): PROCALCITON, LATICACIDVEN in the last 168 hours.  No results found for this or any previous visit (from the past 240 hour(s)).   Radiology Studies: No results found.  Scheduled Meds: . busPIRone  10 mg Oral BID  . enoxaparin (LOVENOX) injection  40 mg Subcutaneous Q24H  . erythromycin  250 mg Oral Q8H  . famotidine (PEPCID) IV  20 mg Intravenous Q12H  . insulin aspart  0-15 Units Subcutaneous TID WC  . insulin aspart  0-5 Units Subcutaneous QHS  . insulin detemir  15 Units Subcutaneous QHS  . metoCLOPramide (REGLAN) injection  10 mg Intravenous Q8H  . pregabalin  150 mg Oral BID  . sodium chloride  1,000 mL Intravenous Once   Continuous Infusions: . sodium chloride 75 mL/hr at 09/28/16 0731     LOS: 0 days    Posey Pronto, PA Student    Attending MD note  Patient was seen, examined,treatment plan was discussed with the PA-S Posey Pronto,.  I have personally reviewed the clinical findings, lab, imaging studies and management of this patient in detail. I agree with the documentation, as recorded by the PA-S and changes to above note were in bold green  51 y/o F with hx of uncontrolled diabetes and gastroparesis admitted for acute exacerbation of  gastroparesis. Failing PO trials. Patient also found on mild DKA   On Exam: Gen. exam: NAD Chest: Good air entry bilaterally, no rhonchi or rales CVS: S1-S2 regular, no murmurs Abdomen: Epigastric tenderness, no guarding or rebound, +BS  Neurology: Non-focal Skin: No rash or lesions  Plan  Diabetic gastroparesis Continue Reglan TID Erythromycin started PO as there is no IV on stock  Continue Zofran  Continue IVF  Mild DKA - Improved  Repeat BMP tonight Continue SSI  Hold Levamir as patient had hypoglycemic episode  Ok to start clears if patient hungry   Rest as above  Chipper Oman, MD  Pager: Text Page via [email or web address]  [phone]  If 7PM-7AM, please contact night-coverage [email or web address] Password [email or web address] 09/28/2016, 3:17 PM

## 2016-09-28 NOTE — Progress Notes (Signed)
Initial Nutrition Assessment  DOCUMENTATION CODES:   Not applicable  INTERVENTION:   RD will order supplements when diet advanced   Nutrition support if unable to advance diet in 3-4 days  NUTRITION DIAGNOSIS:   Inadequate oral intake related to altered GI function, gastroparesis as evidenced by 7 percent weight loss in 3 weeks, per patient/family report, NPO status.  GOAL:   Patient will meet greater than or equal to 90% of their needs  MONITOR:   Diet advancement, Labs, Weight trends, I & O's  REASON FOR ASSESSMENT:   Malnutrition Screening Tool    ASSESSMENT:   51 y.o. female with medical history significant of DM type 2, gastroparesis and GERD. Presented to the ER c/o nausea and vomiting, secondary to gastroparesis.    Met with pt in room today. Pt reports that she feels horrible and having nausea and vomiting at time of RD visit. Pt reports poor appetite for 1 week pta and nausea, diarrhea, and vomiting for 1 day pta. Per chart, pt has lost 10lbs(7%) in 3 weeks; this is severe. Pt currently NPO; RD will continue to monitor for nutritional needs and order supplements when diet advanced. Pt likes chocolate or vanilla Ensure. Consider nutrition support if pt unable to advance diet in 3-4 days.    Medications reviewed and include: lovenox, erythromycin, pepcid, insulin, reglan, morphine, zofran  Labs reviewed: Cl 115(H), creat 1.62(H), tbili 1.4(H)  Nutrition-Focused physical exam completed. Findings are no fat depletion, no muscle depletion, and no edema.   Diet Order:  Diet NPO time specified Except for: Ice Chips, Sips with Meds  Skin:  Reviewed, no issues  Last BM:  4/5  Height:   Ht Readings from Last 1 Encounters:  09/27/16 5' 1.5" (1.562 m)    Weight:   Wt Readings from Last 1 Encounters:  09/27/16 134 lb 11.2 oz (61.1 kg)    Ideal Body Weight:  48.9 kg  BMI:  Body mass index is 25.04 kg/m.  Estimated Nutritional Needs:   Kcal:   1500-1800kcal/day   Protein:  67-79g/day   Fluid:  >1.5L/day   EDUCATION NEEDS:   No education needs identified at this time  Koleen Distance, RD, LDN Pager #[phone]

## 2016-09-28 NOTE — Progress Notes (Signed)
CRITICAL VALUE ALERT  Critical value received:  Glucose 526  Date of notification:  [id number]  Time of notification:  0300  Critical value read back:yes  Nurse who received alert:  [id number]  MD notified (1st page):  K. Schorr  Time of first page:  0305  MD notified (2nd page):na  Time of second page:na  Responding MD:  Lamar Blinks  Time MD responded:  4643

## 2016-09-29 DIAGNOSIS — I10 Essential (primary) hypertension: Secondary | ICD-10-CM

## 2016-09-29 LAB — BASIC METABOLIC PANEL
Anion gap: 7 (ref 5–15)
BUN: 24 mg/dL — AB (ref 6–20)
CHLORIDE: 119 mmol/L — AB (ref 101–111)
CO2: 16 mmol/L — ABNORMAL LOW (ref 22–32)
CREATININE: 1.24 mg/dL — AB (ref 0.44–1.00)
Calcium: 8.4 mg/dL — ABNORMAL LOW (ref 8.9–10.3)
GFR calc Af Amer: 58 mL/min — ABNORMAL LOW (ref >60)
GFR calc non Af Amer: 50 mL/min — ABNORMAL LOW (ref >60)
GLUCOSE: 134 mg/dL — AB (ref 65–99)
POTASSIUM: 4.3 mmol/L (ref 3.5–5.1)
Sodium: 142 mmol/L (ref 135–145)

## 2016-09-29 LAB — CBC WITH DIFFERENTIAL/PLATELET
Basophils Absolute: 0 10*3/uL (ref 0.0–0.1)
Basophils Relative: 0 %
EOS PCT: 1 %
Eosinophils Absolute: 0.1 10*3/uL (ref 0.0–0.7)
HCT: 33 % — ABNORMAL LOW (ref 36.0–46.0)
Hemoglobin: 10.3 g/dL — ABNORMAL LOW (ref 12.0–15.0)
LYMPHS ABS: 2.1 10*3/uL (ref 0.7–4.0)
LYMPHS PCT: 17 %
MCH: 24.3 pg — AB (ref 26.0–34.0)
MCHC: 31.2 g/dL (ref 30.0–36.0)
MCV: 77.8 fL — AB (ref 78.0–100.0)
MONO ABS: 1.1 10*3/uL — AB (ref 0.1–1.0)
Monocytes Relative: 9 %
Neutro Abs: 8.8 10*3/uL — ABNORMAL HIGH (ref 1.7–7.7)
Neutrophils Relative %: 72 %
PLATELETS: 354 10*3/uL (ref 150–400)
RBC: 4.24 MIL/uL (ref 3.87–5.11)
RDW: 15.8 % — AB (ref 11.5–15.5)
WBC: 12.1 10*3/uL — ABNORMAL HIGH (ref 4.0–10.5)

## 2016-09-29 LAB — GLUCOSE, CAPILLARY
GLUCOSE-CAPILLARY: 113 mg/dL — AB (ref 65–99)
Glucose-Capillary: 136 mg/dL — ABNORMAL HIGH (ref 65–99)
Glucose-Capillary: 311 mg/dL — ABNORMAL HIGH (ref 65–99)

## 2016-09-29 MED ORDER — ERYTHROMYCIN BASE 250 MG PO TABS: 250.0000 mg | ORAL_TABLET | Freq: Four times a day (QID) | ORAL | 0 refills | Status: DC

## 2016-09-29 MED ORDER — FAMOTIDINE 20 MG PO TABS: 20.0000 mg | ORAL_TABLET | Freq: Two times a day (BID) | ORAL | Status: DC

## 2016-09-29 MED ORDER — FAMOTIDINE 20 MG PO TABS: 20.0000 mg | ORAL_TABLET | Freq: Two times a day (BID) | ORAL | 0 refills | Status: DC

## 2016-09-29 NOTE — Progress Notes (Signed)
Pharmacy IV to PO conversion  The patient is receiving Pepcid by the intravenous route.  Based on criteria approved by the Pharmacy and [place], the medication is being converted to the equivalent oral dose form.   No active GI bleeding or impaired absorption  Not s/p esophagectomy  Documented ability to take oral medications for > 24 hr  Plan to continue treatment for at least 1 day  If you have any questions about this conversion, please contact the Pharmacy Department (ext [phone]).  Thank you.  Reuel Boom, PharmD Pager: [phone] 09/29/2016, 10:30 AM

## 2016-09-29 NOTE — Care Management Note (Signed)
51 yo F with a medical hx of DM type 2, gastroparesis, and GERD. Presented to the ER c/o nausea and vomiting, secondary to gastroparesis.    Received call from RN stating that pt is going home today and she needs assistance with her meds.  Med with pt. She stated that she doesn't have any medical insurance. She applied for Medicaid and she is waiting to see if she qualifies.  She sees Dr. Jarold Song at the [facility].  Assisted pt with her prescription for Erythromycin tabs through the [facility] program.

## 2016-09-29 NOTE — Discharge Summary (Signed)
Physician Discharge Summary  [name]  MBT:[id number]  DOB: 1965/09/19  DOA: 09/27/2016 PCP: [name], MD  Admit date: 09/27/2016 Discharge date: 09/29/2016  Admitted From: Home Disposition:  Home  Recommendations for Outpatient Follow-up:  1. Follow up with PCP in 1-2 weeks 2. Please obtain BMP/CBC in one week  Discharge Condition: Stable CODE STATUS: Full  Diet recommendation: Heart Healthy / Carb Modified  Brief/Interim Summary: Yvette Thomasis a 51 y.o.femalewith medical history significant of DM type 2, gastroparesis and GERD. Presented to the ER c/o nausea and vomiting, secondary to gastroparesis. Symptoms began the morning of admission, with 2-3 episodes of NBNB emesis. Associated symptoms include diarrhea 3-5 episodes, non bloody. No other complains.   Patient was treated in the ED with Zofran,haldol, ativan and Reglan with no significant improvement. Patient was given 3L NS and GI panel was sent. Water trail were perform which she immediately vomited. Patient was admitted for hydration and treatement of gastroparesis. Patient was started on Erythromycin and Reglan. Was kept NOP and treated for mild DKA. Patient subsequently improved and tolerated full liquids. Patient reported that she's feeling back to baseline. Patient was discharged to follow-up with PCP.  Subjective: Patient seen and examined, tolerating diet well. No abdominal pain.    Discharge Diagnoses/Hospital Course:  Diabetic Gastroparesis - failed PO trials, Pt received, Benadryl 50mg , Haldol, Ativan, Zofran and phenergan.  Patient was treated with IV fluids IV Reglan 3 times a day and erythromycin 250 mg by mouth 4 times a day Advice strict glucose control  Patient given prescription for erythromycin unable to feel advice to continue Reglan 3 times a day  DM type 2 with mild anion gap - resolved CBGs were stable during hospital stay She was treated with insulin despite being nothing by mouth  subsequently insulin was adjusted and discharged on her home insulin regimen Monitor fingerstick glucose goal of < 140 fasting  HTN - BP was stable during hospital stay Continue home medication, no changes in medication were made. Monitor, follow-up with PCP  GERD Protonix switched to Pepcid  All other chronic medical condition were stable during the hospitalization.  On the day of the discharge the patient's vitals were stable, and no other acute medical condition were reported by patient. Patient was felt safe to be discharged to home  Discharge Instructions  You were cared for by a hospitalist during your hospital stay. If you have any questions about your discharge medications or the care you received while you were in the hospital after you are discharged, you can call the unit and asked to speak with the hospitalist on call if the hospitalist that took care of you is not available. Once you are discharged, your primary care physician will handle any further medical issues. Please note that NO REFILLS for any discharge medications will be authorized once you are discharged, as it is imperative that you return to your primary care physician (or establish a relationship with a primary care physician if you do not have one) for your aftercare needs so that they can reassess your need for medications and monitor your lab values.  Discharge Instructions    Call MD for:  difficulty breathing, headache or visual disturbances    Complete by:  As directed    Call MD for:  extreme fatigue    Complete by:  As directed    Call MD for:  hives    Complete by:  As directed    Call MD for:  persistant dizziness  or light-headedness    Complete by:  As directed    Call MD for:  persistant nausea and vomiting    Complete by:  As directed    Call MD for:  redness, tenderness, or signs of infection (pain, swelling, redness, odor or green/yellow discharge around incision site)    Complete by:  As  directed    Call MD for:  severe uncontrolled pain    Complete by:  As directed    Call MD for:  temperature >100.4    Complete by:  As directed    Diet - low sodium heart healthy    Complete by:  As directed    Increase activity slowly    Complete by:  As directed      Allergies as of 09/29/2016      Reactions   Anesthetics, Amide Nausea And Vomiting   Penicillins Diarrhea, Nausea And Vomiting   Has patient had a PCN reaction causing immediate rash, facial/tongue/throat swelling, SOB or lightheadedness with hypotension: No Has patient had a PCN reaction causing severe rash involving mucus membranes or skin necrosis: No Has patient had a PCN reaction that required hospitalization No Has patient had a PCN reaction occurring within the last 10 years: Yes  If all of the above answers are "NO", then may proceed with Cephalosporin use.   Buprenorphine Hcl Rash   Encainide Nausea And Vomiting      Medication List    STOP taking these medications   pantoprazole 40 MG tablet Commonly known as:  PROTONIX     TAKE these medications   albuterol 108 (90 Base) MCG/ACT inhaler Commonly known as:  PROVENTIL HFA;VENTOLIN HFA Inhale 2 puffs into the lungs every 6 (six) hours as needed for wheezing or shortness of breath.   atorvastatin 40 MG tablet Commonly known as:  LIPITOR Take 1 tablet (40 mg total) by mouth daily.   busPIRone 10 MG tablet Commonly known as:  BUSPAR Take 1 tablet (10 mg total) by mouth 2 (two) times daily.   docusate sodium 250 MG capsule Commonly known as:  COLACE Take 1 capsule (250 mg total) by mouth 2 (two) times daily.   erythromycin 250 MG tablet Commonly known as:  E-MYCIN Take 1 tablet (250 mg total) by mouth 4 (four) times daily.   famotidine 20 MG tablet Commonly known as:  PEPCID Take 1 tablet (20 mg total) by mouth 2 (two) times daily.   ferrous sulfate 325 (65 FE) MG tablet Take 1 tablet (325 mg total) by mouth daily with breakfast.    FLUoxetine 20 MG tablet Commonly known as:  PROZAC Take 2 tablets (40 mg total) by mouth daily.   furosemide 40 MG tablet Commonly known as:  LASIX Take 40 mg by mouth daily as needed for fluid.   glucose blood test strip Commonly known as:  TRUE METRIX BLOOD GLUCOSE TEST Use 3 times daily before meals   insulin aspart 100 UNIT/ML injection Commonly known as:  novoLOG Inject 0-15 Units into the skin 3 (three) times daily with meals. Per sliding scale   insulin detemir 100 UNIT/ML injection Commonly known as:  LEVEMIR Inject 0.15 mLs (15 Units total) into the skin 2 (two) times daily.   Insulin Syringe-Needle U-100 31G X 15/64" 0.5 ML Misc Commonly known as:  BD INSULIN SYRINGE ULTRAFINE 1 each by Does not apply route 4 (four) times daily as needed.   metoCLOPramide 10 MG tablet Commonly known as:  REGLAN Take 1 tablet (10  mg total) by mouth 3 (three) times daily before meals.   ondansetron 4 MG disintegrating tablet Commonly known as:  ZOFRAN ODT Take 1 tablet (4 mg total) by mouth every 8 (eight) hours as needed for nausea or vomiting.   polyethylene glycol packet Commonly known as:  MIRALAX / GLYCOLAX Take 17 g by mouth daily as needed for mild constipation.   pregabalin 150 MG capsule Commonly known as:  LYRICA Take 1 capsule (150 mg total) by mouth 2 (two) times daily.   TRUE METRIX METER Devi 1 each by Does not apply route 3 (three) times daily before meals.       Allergies  Allergen Reactions  . Anesthetics, Amide Nausea And Vomiting  . Penicillins Diarrhea and Nausea And Vomiting    Has patient had a PCN reaction causing immediate rash, facial/tongue/throat swelling, SOB or lightheadedness with hypotension: No Has patient had a PCN reaction causing severe rash involving mucus membranes or skin necrosis: No Has patient had a PCN reaction that required hospitalization No Has patient had a PCN reaction occurring within the last 10 years: Yes  If all of the  above answers are "NO", then may proceed with Cephalosporin use.   . Buprenorphine Hcl Rash  . Encainide Nausea And Vomiting    Consultations:  None    Procedures/Studies: No results found.   Discharge Exam: Vitals:   09/29/16 0658 09/29/16 1320  BP: 116/61 (!) 106/58  Pulse: 83 87  Resp: 18 18  Temp: 98.3 F (36.8 C) 98.3 F (36.8 C)   Vitals:   09/28/16 1449 09/28/16 2205 09/29/16 0658 09/29/16 1320  BP: (!) 104/52 (!) 92/53 116/61 (!) 106/58  Pulse:  89 83 87  Resp:  19 18 18   Temp:  98.5 F (36.9 C) 98.3 F (36.8 C) 98.3 F (36.8 C)  TempSrc:  Oral Oral Oral  SpO2:  99% 100% 100%  Weight:      Height:        General: Pt is alert, awake, not in acute distress Cardiovascular: RRR, S1/S2 +, no rubs, no gallops Respiratory: CTA bilaterally, no wheezing, no rhonchi Abdominal: Soft, NT, ND, bowel sounds + Extremities: no edema, no cyanosis   The results of significant diagnostics from this hospitalization (including imaging, microbiology, ancillary and laboratory) are listed below for reference.     Microbiology: Recent Results (from the past 240 hour(s))  Gastrointestinal Panel by PCR , Stool     Status: None   Collection Time: 09/27/16  1:49 PM  Result Value Ref Range Status   Campylobacter species NOT DETECTED NOT DETECTED Final   Plesimonas shigelloides NOT DETECTED NOT DETECTED Final   Salmonella species NOT DETECTED NOT DETECTED Final   Yersinia enterocolitica NOT DETECTED NOT DETECTED Final   Vibrio species NOT DETECTED NOT DETECTED Final   Vibrio cholerae NOT DETECTED NOT DETECTED Final   Enteroaggregative E coli (EAEC) NOT DETECTED NOT DETECTED Final   Enteropathogenic E coli (EPEC) NOT DETECTED NOT DETECTED Final   Enterotoxigenic E coli (ETEC) NOT DETECTED NOT DETECTED Final   Shiga like toxin producing E coli (STEC) NOT DETECTED NOT DETECTED Final   Shigella/Enteroinvasive E coli (EIEC) NOT DETECTED NOT DETECTED Final   Cryptosporidium NOT  DETECTED NOT DETECTED Final   Cyclospora cayetanensis NOT DETECTED NOT DETECTED Final   Entamoeba histolytica NOT DETECTED NOT DETECTED Final   Giardia lamblia NOT DETECTED NOT DETECTED Final   Adenovirus F40/41 NOT DETECTED NOT DETECTED Final   Astrovirus NOT DETECTED NOT  DETECTED Final   Norovirus GI/GII NOT DETECTED NOT DETECTED Final   Rotavirus A NOT DETECTED NOT DETECTED Final   Sapovirus (I, II, IV, and V) NOT DETECTED NOT DETECTED Final     Labs: BNP (last 3 results) No results for input(s): BNP in the last 8760 hours. Basic Metabolic Panel:  Recent Labs Lab 09/27/16 1123 09/28/16 0220 09/28/16 0650 09/28/16 2108 09/29/16 0637  NA 135 139 143 139 142  K 4.2 5.0 4.5 4.1 4.3  CL 102 111 115* 115* 119*  CO2 17* 9* 13* 17* 16*  GLUCOSE 317* 526* 293* 296* 134*  BUN 15 19 19  25* 24*  CREATININE 1.04* 1.54* 1.62* 1.48* 1.24*  CALCIUM 9.6 9.0 9.2 8.4* 8.4*   Liver Function Tests:  Recent Labs Lab 09/27/16 1123 09/28/16 0650  AST 27 23  ALT 14 14  ALKPHOS 92 90  BILITOT 1.4* 1.4*  PROT 8.7* 8.5*  ALBUMIN 4.3 4.0    Recent Labs Lab 09/27/16 1123  LIPASE 22   No results for input(s): AMMONIA in the last 168 hours. CBC:  Recent Labs Lab 09/27/16 1123 09/28/16 0650 09/29/16 0637  WBC 11.7* 16.4* 12.1*  NEUTROABS 10.5*  --  8.8*  HGB 11.6* 11.5* 10.3*  HCT 36.3 36.6 33.0*  MCV 75.0* 78.9 77.8*  PLT 457* 353 354   Cardiac Enzymes: No results for input(s): CKTOTAL, CKMB, CKMBINDEX, TROPONINI in the last 168 hours. BNP: Invalid input(s): POCBNP CBG:  Recent Labs Lab 09/28/16 1750 09/28/16 2207 09/29/16 0746 09/29/16 1153 09/29/16 1647  GLUCAP 109* 255* 136* 311* 113*   D-Dimer No results for input(s): DDIMER in the last 72 hours. Hgb A1c  Recent Labs  09/27/16 1122  HGBA1C 10.0*   Lipid Profile No results for input(s): CHOL, HDL, LDLCALC, TRIG, CHOLHDL, LDLDIRECT in the last 72 hours. Thyroid function studies No results for  input(s): TSH, T4TOTAL, T3FREE, THYROIDAB in the last 72 hours.  Invalid input(s): FREET3 Anemia work up No results for input(s): VITAMINB12, FOLATE, FERRITIN, TIBC, IRON, RETICCTPCT in the last 72 hours. Urinalysis    Component Value Date/Time   COLORURINE STRAW (A) 09/27/2016 1352   APPEARANCEUR CLEAR 09/27/2016 1352   LABSPEC 1.018 09/27/2016 1352   PHURINE 6.0 09/27/2016 1352   GLUCOSEU >=500 (A) 09/27/2016 1352   HGBUR SMALL (A) 09/27/2016 1352   BILIRUBINUR NEGATIVE 09/27/2016 1352   BILIRUBINUR NEGATIVE 08/04/2012 1005   KETONESUR 80 (A) 09/27/2016 1352   PROTEINUR 100 (A) 09/27/2016 1352   UROBILINOGEN 0.2 04/27/2015 1608   NITRITE NEGATIVE 09/27/2016 1352   LEUKOCYTESUR NEGATIVE 09/27/2016 1352   Sepsis Labs Invalid input(s): PROCALCITONIN,  WBC,  LACTICIDVEN Microbiology Recent Results (from the past 240 hour(s))  Gastrointestinal Panel by PCR , Stool     Status: None   Collection Time: 09/27/16  1:49 PM  Result Value Ref Range Status   Campylobacter species NOT DETECTED NOT DETECTED Final   Plesimonas shigelloides NOT DETECTED NOT DETECTED Final   Salmonella species NOT DETECTED NOT DETECTED Final   Yersinia enterocolitica NOT DETECTED NOT DETECTED Final   Vibrio species NOT DETECTED NOT DETECTED Final   Vibrio cholerae NOT DETECTED NOT DETECTED Final   Enteroaggregative E coli (EAEC) NOT DETECTED NOT DETECTED Final   Enteropathogenic E coli (EPEC) NOT DETECTED NOT DETECTED Final   Enterotoxigenic E coli (ETEC) NOT DETECTED NOT DETECTED Final   Shiga like toxin producing E coli (STEC) NOT DETECTED NOT DETECTED Final   Shigella/Enteroinvasive E coli (EIEC) NOT DETECTED NOT DETECTED  Final   Cryptosporidium NOT DETECTED NOT DETECTED Final   Cyclospora cayetanensis NOT DETECTED NOT DETECTED Final   Entamoeba histolytica NOT DETECTED NOT DETECTED Final   Giardia lamblia NOT DETECTED NOT DETECTED Final   Adenovirus F40/41 NOT DETECTED NOT DETECTED Final   Astrovirus  NOT DETECTED NOT DETECTED Final   Norovirus GI/GII NOT DETECTED NOT DETECTED Final   Rotavirus A NOT DETECTED NOT DETECTED Final   Sapovirus (I, II, IV, and V) NOT DETECTED NOT DETECTED Final     Time coordinating discharge: 35 minutes  SIGNED:  Chipper Oman, MD  [facility] 09/29/2016, 7:03 PM  Pager please text page via  [email or web address] Password [email or web address]

## 2016-09-29 NOTE — Progress Notes (Signed)
Pt leaving this afternoon with her niece. Alert, oriented, and without c/o. Discharge instructions/prescriptions given/explained with pt verbalizing understanding. Pt received "coupon" for discount on antibiotic from [facility].

## 2016-09-30 ENCOUNTER — Encounter (HOSPITAL_COMMUNITY): Payer: Self-pay | Admitting: Nurse Practitioner

## 2016-09-30 ENCOUNTER — Emergency Department (HOSPITAL_COMMUNITY)
Admission: EM | Admit: 2016-09-30 | Discharge: 2016-10-01 | Disposition: A | Discharge status: Home / Self Care | Payer: Medicaid Other | Attending: Emergency Medicine | Admitting: Emergency Medicine

## 2016-09-30 DIAGNOSIS — E1040 Type 1 diabetes mellitus with diabetic neuropathy, unspecified: Secondary | ICD-10-CM | POA: Diagnosis not present

## 2016-09-30 DIAGNOSIS — E1043 Type 1 diabetes mellitus with diabetic autonomic (poly)neuropathy: Secondary | ICD-10-CM | POA: Insufficient documentation

## 2016-09-30 DIAGNOSIS — R112 Nausea with vomiting, unspecified: Secondary | ICD-10-CM

## 2016-09-30 DIAGNOSIS — Z79899 Other long term (current) drug therapy: Secondary | ICD-10-CM | POA: Insufficient documentation

## 2016-09-30 DIAGNOSIS — I10 Essential (primary) hypertension: Secondary | ICD-10-CM | POA: Insufficient documentation

## 2016-09-30 NOTE — ED Notes (Signed)
Bed: WA03 Expected date:  Expected time:  Means of arrival:  Comments: 

## 2016-09-30 NOTE — ED Provider Notes (Signed)
[facility] DEPT Provider Note   CSN: [id number] Arrival date & time: 09/30/16  2103   By signing my name below, I, [name], attest that this documentation has been prepared under the direction and in the presence of [employer], PA-C. Electronically Signed: [name], Scribe. 09/30/16. 12:53 AM.   History   Chief Complaint Chief Complaint  Patient presents with  . Emesis  . Abdominal Pain   The history is provided by the patient and medical records. No language interpreter was used.    HPI Comments: [name] is a 51 y.o. female with Hx of gastroparesis, Type 1 DM, Anemia, GERD, pyelonephritis, DKA, HLD and leukocytosis who presents to the Emergency Department complaining of gradually worsening abdominal pain since ~8 PM last night. Pt describes 8/10, constant, stabbing, R sided abdominal pain with no modifying factors. Pt notes associated nausea, vomiting with dry heaves. She states her current symptoms are similar to prior flare ups related to gastroparesis and she is unsure what medications she was given during her last hospitalization for this issue. Pt given Zofran in [facility] ED prior to evaluation without relief to nausea; she adds she took Zofran "around 7:30" last night without relief to nausea. Pt denies dysuria, diarrhea, fever, Hematochezia, melena.  Past Medical History:  Diagnosis Date  . Depression   . Diabetes mellitus without complication ([place])   . Essential hypertension   . Gastroparesis   . GERD (gastroesophageal reflux disease)   . HLD (hyperlipidemia)     Patient Active Problem List   Diagnosis Date Noted  . Gastroparesis 04/20/2016  . Type 1 diabetes mellitus with ketoacidosis without coma ([place])   . DKA (diabetic ketoacidoses) ([place]) 03/18/2016  . Noncompliance with treatment plan 03/13/2016  . Essential hypertension 01/24/2016  . Leukocytosis 01/24/2016  . Abnormal ECG 11/27/2015  . HLD (hyperlipidemia)   . GERD (gastroesophageal  reflux disease)   . Metabolic acidosis, normal anion gap (NAG)   . AKI (acute kidney injury) ([place])   . Anemia, iron deficiency   . DKA, type 1, not at goal [facility])   . Hyponatremia 09/02/2015  . Vitamin B12 deficiency 08/16/2015  . Lactic acidosis 07/29/2015  . Acute pyelonephritis 07/29/2015  . Pyelonephritis   . Hypothermia   . Diabetic gastroparesis ([place])   . Diabetic neuropathy, type I diabetes mellitus ([place]) 05/18/2015  . Anemia 05/18/2015  . Anxiety and depression 05/18/2015  . Nausea & vomiting   . Diabetic ketoacidosis without coma associated with type 1 diabetes mellitus ([place])   . High anion gap metabolic acidosis 41/32/4401    Past Surgical History:  Procedure Laterality Date  . COLONOSCOPY  09/27/2014   at [facility]  . ESOPHAGOGASTRODUODENOSCOPY  09/27/2014   at [facility], Dr Rolan Lipa. biospy neg for celiac, neg for H pylori.   . EYE SURGERY    . gailstones    . IR GENERIC HISTORICAL  01/24/2016   IR FLUORO GUIDE CV LINE RIGHT 01/24/2016 Darrell K Allred, PA-C [facility]  . IR GENERIC HISTORICAL  01/24/2016   IR US GUIDE VASC ACCESS RIGHT 01/24/2016 Darrell K Allred, PA-C [facility]  . POSTERIOR VITRECTOMY AND MEMBRANE PEEL-LEFT EYE  09/28/2002  . POSTERIOR VITRECTOMY AND MEMBRANE PEEL-RIGHT EYE  03/16/2002  . RETINAL DETACHMENT SURGERY      OB History    No data available       Home Medications    Prior to Admission medications   Medication Sig Start Date End Date Taking? Authorizing Provider  atorvastatin (  LIPITOR) 40 MG tablet Take 1 tablet (40 mg total) by mouth daily. 09/04/16  Yes [name], MD  busPIRone (BUSPAR) 10 MG tablet Take 1 tablet (10 mg total) by mouth 2 (two) times daily. 09/04/16  Yes [name], MD  erythromycin (E-MYCIN) 250 MG tablet Take 1 tablet (250 mg total) by mouth 4 (four) times daily. 09/29/16  Yes Doreatha Lew, MD  famotidine (PEPCID) 20 MG tablet Take 1 tablet (20 mg total) by mouth 2 (two) times daily. 09/29/16  Yes Doreatha Lew, MD  FLUoxetine (PROZAC) 20 MG tablet Take 2 tablets (40 mg total) by mouth daily. 09/04/16  Yes [name], MD  furosemide (LASIX) 40 MG tablet Take 40 mg by mouth daily as needed for fluid.    Yes Historical Provider, MD  insulin aspart (NOVOLOG) 100 UNIT/ML injection Inject 0-15 Units into the skin 3 (three) times daily with meals. Per sliding scale 09/04/16  Yes [name], MD  insulin detemir (LEVEMIR) 100 UNIT/ML injection Inject 0.15 mLs (15 Units total) into the skin 2 (two) times daily. 09/04/16  Yes [name], MD  metoCLOPramide (REGLAN) 10 MG tablet Take 1 tablet (10 mg total) by mouth 3 (three) times daily before meals. 03/26/16  Yes Donne Hazel, MD  ondansetron (ZOFRAN ODT) 4 MG disintegrating tablet Take 1 tablet (4 mg total) by mouth every 8 (eight) hours as needed for nausea or vomiting. 09/04/16  Yes [name], MD  polyethylene glycol (MIRALAX / GLYCOLAX) packet Take 17 g by mouth daily as needed for mild constipation. 09/04/16  Yes [name], MD  pregabalin (LYRICA) 150 MG capsule Take 1 capsule (150 mg total) by mouth 2 (two) times daily. 09/04/16  Yes [name], MD  albuterol (PROVENTIL HFA;VENTOLIN HFA) 108 (90 Base) MCG/ACT inhaler Inhale 2 puffs into the lungs every 6 (six) hours as needed for wheezing or shortness of breath. Patient not taking: Reported on 09/04/2016 10/20/15   [name], MD  Blood Glucose Monitoring Suppl (TRUE METRIX METER) DEVI 1 each by Does not apply route 3 (three) times daily before meals. 07/19/15   [name], MD  docusate sodium (COLACE) 250 MG capsule Take 1 capsule (250 mg total) by mouth 2 (two) times daily. Patient not taking: Reported on 09/27/2016 03/26/16   Donne Hazel, MD  ferrous sulfate 325 (65 FE) MG tablet Take 1 tablet (325 mg total) by mouth daily with breakfast. Patient not taking: Reported on 09/04/2016 03/27/16   Donne Hazel, MD  glucose blood (TRUE METRIX BLOOD GLUCOSE TEST) test strip Use 3 times daily before  meals 03/13/16   [name], MD  Insulin Syringe-Needle U-100 (BD INSULIN SYRINGE ULTRAFINE) 31G X 15/64" 0.5 ML MISC 1 each by Does not apply route 4 (four) times daily as needed. 08/16/15   [name], MD  promethazine (PHENERGAN) 25 MG suppository Place 1 suppository (25 mg total) rectally every 6 (six) hours as needed for nausea or vomiting. 10/01/16   Jarrett Soho Shawna Kiener, PA-C    Family History Family History  Problem Relation Age of Onset  . Cystic fibrosis Mother   . Hypertension Father   . Diabetes Brother   . Hypertension Maternal Grandmother     Social History Social History  Substance Use Topics  . Smoking status: Never Smoker  . Smokeless tobacco: Never Used  . Alcohol use No     Allergies   Anesthetics, amide; Penicillins; Buprenorphine hcl; and Encainide   Review of Systems Review of  Systems  Constitutional: Negative for fever.  Gastrointestinal: Positive for abdominal pain, nausea and vomiting. Negative for diarrhea.  Genitourinary: Negative for dysuria.  All other systems reviewed and are negative.    Physical Exam Updated Vital Signs BP (!) 186/84 (BP Location: Right Arm)   Pulse (!) 102   Temp 98.4 F (36.9 C) (Oral)   Resp 18   Ht 5\' 1"  (1.549 m)   Wt 134 lb (60.8 kg)   LMP 09/04/2016   SpO2 100%   BMI 25.32 kg/m   Physical Exam  Constitutional: She appears well-developed and well-nourished. She appears distressed.  Awake, alert,  Patient appears distressed, gagging and dry heaving  HENT:  Head: Normocephalic and atraumatic.  Mouth/Throat: Oropharynx is clear and moist. Mucous membranes are not dry. No oropharyngeal exudate.  Eyes: Conjunctivae are normal. No scleral icterus.  Neck: Normal range of motion. Neck supple.  Cardiovascular: Regular rhythm and intact distal pulses.  Tachycardia present.   Pulses:      Radial pulses are 2+ on the right side, and 2+ on the left side.       Dorsalis pedis pulses are 2+ on the right side, and 2+  on the left side.  Pulmonary/Chest: Effort normal and breath sounds normal. No respiratory distress. She has no wheezes.  Equal chest expansion  Abdominal: Soft. Bowel sounds are normal. She exhibits no distension and no mass. There is generalized tenderness. There is no rigidity, no rebound, no guarding and no CVA tenderness.  Musculoskeletal: Normal range of motion. She exhibits no edema.  Neurological: She is alert.  Speech is clear and goal oriented Moves extremities without ataxia  Skin: Skin is warm and dry. She is not diaphoretic.  Psychiatric: She has a normal mood and affect.  Nursing note and vitals reviewed.    ED Treatments / Results  DIAGNOSTIC STUDIES: Oxygen Saturation is 100% on RA, normal by my interpretation.    COORDINATION OF CARE: 12:52 AM Discussed treatment plan with pt at bedside and pt agreed to plan. Will order medications.  Labs (all labs ordered are listed, but only abnormal results are displayed) Labs Reviewed  COMPREHENSIVE METABOLIC PANEL - Abnormal; Notable for the following:       Result Value   CO2 19 (*)    Glucose, Bld 271 (*)    Calcium 8.3 (*)    ALT 12 (*)    All other components within normal limits  CBC - Abnormal; Notable for the following:    Hemoglobin 10.4 (*)    HCT 32.7 (*)    MCV 74.5 (*)    MCH 23.7 (*)    All other components within normal limits  LIPASE, BLOOD  URINALYSIS, ROUTINE W REFLEX MICROSCOPIC    Procedures Procedures (including critical care time)  Medications Ordered in ED Medications  ondansetron (ZOFRAN) injection 4 mg (4 mg Intravenous Given 10/01/16 0104)  sodium chloride 0.9 % bolus 1,000 mL (0 mLs Intravenous Stopped 10/01/16 0405)  metoCLOPramide (REGLAN) injection 10 mg (10 mg Intravenous Given 10/01/16 0104)  diphenhydrAMINE (BENADRYL) injection 25 mg (25 mg Intravenous Given 10/01/16 0104)  LORazepam (ATIVAN) injection 1 mg (1 mg Intravenous Given 10/01/16 0105)     Initial Impression / Assessment and  Plan / ED Course  I have reviewed the triage vital signs and the nursing notes.  Pertinent labs & imaging results that were available during my care of the patient were reviewed by me and considered in my medical decision making (see chart for  details).     Patient presents with history of gastroparesis and several hours of vomiting.  On my exam she is tachycardic. Her mucous membranes are not dry. She is gagging and spitting up clear sputum. No bilious emesis. Abdomen is soft and generally tender. Patient is without hypotension. Mild hypertension noted. Patient without chest pain or shortness of breath. Anemia is baseline. Patient with elevated glucose at 271 but no evidence of DKA. Normal anion gap. No leukocytosis. Patient is afebrile. Medications given here along with fluid bolus. No additional emesis. Patient refuses to attempt by mouth trial but has been many hours without emesis. We'll discharge to home. Discussed reasons to return to the emergency department. Patient reported her Zofran at home wasn't helpful therefore she was prescribed Phenergan suppositories here in the emergency department.  BP (!) 171/89 (BP Location: Right Arm)   Pulse 98   Temp 98.7 F (37.1 C) (Oral)   Resp 18   Ht 5\' 1"  (1.549 m)   Wt 60.8 kg   LMP 09/04/2016   SpO2 100%   BMI 25.32 kg/m   Patient noted to be hypertensive in the emergency department.  No signs of hypertensive urgency.  Discussed with patient the need for close follow-up and management by their primary care physician.      Final Clinical Impressions(s) / ED Diagnoses   Final diagnoses:  Non-intractable vomiting with nausea, unspecified vomiting type    New Prescriptions New Prescriptions   PROMETHAZINE (PHENERGAN) 25 MG SUPPOSITORY    Place 1 suppository (25 mg total) rectally every 6 (six) hours as needed for nausea or vomiting.   I personally performed the services described in this documentation, which was scribed in my  presence. The recorded information has been reviewed and is accurate.    Abigail Butts, PA-C 10/01/16 2706    [name], MD 10/01/16 [phone]

## 2016-09-30 NOTE — ED Triage Notes (Signed)
Pt states "her gastroparesis is acting up." C/o nausea, vomiting and abdominal pain 8/10.

## 2016-09-30 NOTE — ED Notes (Signed)
Pt states that she has stabbing right side abdominal pain 8/10, that is associated with dry heaves, Pt BS are hyperactive. Pt states that she took Zofran for nausea but was not effective in managing discomfort. Pt denies diarrhea, and urinary burning  Or pain

## 2016-10-01 LAB — COMPREHENSIVE METABOLIC PANEL
ALK PHOS: 79 U/L (ref 38–126)
ALT: 12 U/L — ABNORMAL LOW (ref 14–54)
AST: 19 U/L (ref 15–41)
Albumin: 3.6 g/dL (ref 3.5–5.0)
Anion gap: 10 (ref 5–15)
BILIRUBIN TOTAL: 1.2 mg/dL (ref 0.3–1.2)
BUN: 8 mg/dL (ref 6–20)
CHLORIDE: 108 mmol/L (ref 101–111)
CO2: 19 mmol/L — AB (ref 22–32)
CREATININE: 0.84 mg/dL (ref 0.44–1.00)
Calcium: 8.3 mg/dL — ABNORMAL LOW (ref 8.9–10.3)
Glucose, Bld: 271 mg/dL — ABNORMAL HIGH (ref 65–99)
Potassium: 3.5 mmol/L (ref 3.5–5.1)
Sodium: 137 mmol/L (ref 135–145)
Total Protein: 7.2 g/dL (ref 6.5–8.1)

## 2016-10-01 LAB — CBC
HEMATOCRIT: 32.7 % — AB (ref 36.0–46.0)
Hemoglobin: 10.4 g/dL — ABNORMAL LOW (ref 12.0–15.0)
MCH: 23.7 pg — AB (ref 26.0–34.0)
MCHC: 31.8 g/dL (ref 30.0–36.0)
MCV: 74.5 fL — ABNORMAL LOW (ref 78.0–100.0)
PLATELETS: 351 10*3/uL (ref 150–400)
RBC: 4.39 MIL/uL (ref 3.87–5.11)
RDW: 14.9 % (ref 11.5–15.5)
WBC: 6.3 10*3/uL (ref 4.0–10.5)

## 2016-10-01 LAB — LIPASE, BLOOD: LIPASE: 24 U/L (ref 11–51)

## 2016-10-01 MED ORDER — ONDANSETRON HCL 4 MG/2ML IJ SOLN
4.0000 mg | Freq: Once | INTRAMUSCULAR | Status: AC
  Administered 2016-10-01: 4 mg via INTRAVENOUS
  Filled 2016-10-01: qty 2

## 2016-10-01 MED ORDER — METOCLOPRAMIDE HCL 5 MG/ML IJ SOLN
10.0000 mg | Freq: Once | INTRAMUSCULAR | Status: AC
  Administered 2016-10-01: 10 mg via INTRAVENOUS
  Filled 2016-10-01: qty 2

## 2016-10-01 MED ORDER — LORAZEPAM 2 MG/ML IJ SOLN
1.0000 mg | Freq: Once | INTRAMUSCULAR | Status: AC
  Administered 2016-10-01: 1 mg via INTRAVENOUS
  Filled 2016-10-01: qty 1

## 2016-10-01 MED ORDER — PROMETHAZINE HCL 25 MG RE SUPP: 25.0000 mg | Freq: Four times a day (QID) | RECTAL | 0 refills | Status: DC | PRN

## 2016-10-01 MED ORDER — DIPHENHYDRAMINE HCL 50 MG/ML IJ SOLN
25.0000 mg | Freq: Once | INTRAMUSCULAR | Status: AC
  Administered 2016-10-01: 25 mg via INTRAVENOUS
  Filled 2016-10-01: qty 1

## 2016-10-01 MED ORDER — SODIUM CHLORIDE 0.9 % IV BOLUS (SEPSIS)
1000.0000 mL | INTRAVENOUS | Status: AC
  Administered 2016-10-01: 1000 mL via INTRAVENOUS

## 2016-10-01 NOTE — Discharge Instructions (Signed)
1. Medications: Phenergan suppository, usual home medications 2. Treatment: rest, drink plenty of fluids, advance diet slowly 3. Follow Up: Please followup with your primary doctor in 2 days for discussion of your diagnoses and further evaluation after today's visit; if you do not have a primary care doctor use the resource guide provided to find one; Please return to the ER for persistent vomiting, high fevers or worsening symptoms

## 2016-10-01 NOTE — ED Notes (Signed)
PT is crying in pt stating that she has not seen a MD at this point

## 2016-10-18 ENCOUNTER — Ambulatory Visit: Payer: Medicaid Other | Attending: Family Medicine | Admitting: Physician Assistant

## 2016-10-18 ENCOUNTER — Encounter: Payer: Self-pay | Admitting: Physician Assistant

## 2016-10-18 VITALS — BP 156/78 | HR 84 | Temp 98.1°F | Resp 16 | Wt 147.0 lb

## 2016-10-18 DIAGNOSIS — R300 Dysuria: Secondary | ICD-10-CM | POA: Insufficient documentation

## 2016-10-18 DIAGNOSIS — N3001 Acute cystitis with hematuria: Secondary | ICD-10-CM | POA: Diagnosis not present

## 2016-10-18 DIAGNOSIS — Z79899 Other long term (current) drug therapy: Secondary | ICD-10-CM | POA: Diagnosis not present

## 2016-10-18 DIAGNOSIS — E1010 Type 1 diabetes mellitus with ketoacidosis without coma: Secondary | ICD-10-CM

## 2016-10-18 DIAGNOSIS — Z794 Long term (current) use of insulin: Secondary | ICD-10-CM | POA: Insufficient documentation

## 2016-10-18 DIAGNOSIS — E119 Type 2 diabetes mellitus without complications: Secondary | ICD-10-CM | POA: Diagnosis not present

## 2016-10-18 DIAGNOSIS — Z880 Allergy status to penicillin: Secondary | ICD-10-CM | POA: Insufficient documentation

## 2016-10-18 DIAGNOSIS — R350 Frequency of micturition: Secondary | ICD-10-CM | POA: Diagnosis present

## 2016-10-18 LAB — POCT URINALYSIS DIPSTICK
Bilirubin, UA: NEGATIVE
GLUCOSE UA: 500
KETONES UA: NEGATIVE
Nitrite, UA: POSITIVE
Protein, UA: 100
SPEC GRAV UA: 1.02 (ref 1.010–1.025)
Urobilinogen, UA: 0.2 E.U./dL
pH, UA: 5 (ref 5.0–8.0)

## 2016-10-18 LAB — GLUCOSE, POCT (MANUAL RESULT ENTRY): POC Glucose: 89 mg/dl (ref 70–99)

## 2016-10-18 MED ORDER — FLUCONAZOLE 150 MG PO TABS: 150.0000 mg | ORAL_TABLET | Freq: Once | ORAL | 0 refills | Status: AC

## 2016-10-18 MED ORDER — NITROFURANTOIN MONOHYD MACRO 100 MG PO CAPS: 100.0000 mg | ORAL_CAPSULE | Freq: Two times a day (BID) | ORAL | 0 refills | Status: DC

## 2016-10-18 MED FILL — FLUCONAZOLE 150 MG TABLET: 150 | 1 days supply | Qty: 1 | Fill #0

## 2016-10-18 MED FILL — NITROFURANTOIN MONO-MCR 100: 100 | 5 days supply | Qty: 10 | Fill #0

## 2016-10-18 NOTE — Patient Instructions (Signed)
Urinary Tract Infection, Adult °A urinary tract infection (UTI) is an infection of any part of the urinary tract. The urinary tract includes the: °· Kidneys. °· Ureters. °· Bladder. °· Urethra. °These organs make, store, and get rid of pee (urine) in the body. °Follow these instructions at home: °· Take over-the-counter and prescription medicines only as told by your doctor. °· If you were prescribed an antibiotic medicine, take it as told by your doctor. Do not stop taking the antibiotic even if you start to feel better. °· Avoid the following drinks: °¨ Alcohol. °¨ Caffeine. °¨ Tea. °¨ Carbonated drinks. °· Drink enough fluid to keep your pee clear or pale yellow. °· Keep all follow-up visits as told by your doctor. This is important. °· Make sure to: °¨ Empty your bladder often and completely. Do not to hold pee for long periods of time. °¨ Empty your bladder before and after sex. °¨ Wipe from front to back after a bowel movement if you are female. Use each tissue one time when you wipe. °Contact a doctor if: °· You have back pain. °· You have a fever. °· You feel sick to your stomach (nauseous). °· You throw up (vomit). °· Your symptoms do not get better after 3 days. °· Your symptoms go away and then come back. °Get help right away if: °· You have very bad back pain. °· You have very bad lower belly (abdominal) pain. °· You are throwing up and cannot keep down any medicines or water. °This information is not intended to replace advice given to you by your health care provider. Make sure you discuss any questions you have with your health care provider. °Document Released: 11/28/2007 Document Revised: 11/17/2015 Document Reviewed: 05/02/2015 °Elsevier Interactive Patient Education © 2017 Elsevier Inc. ° °

## 2016-10-18 NOTE — Progress Notes (Signed)
[name], is a 51 y.o. female  AJO:[id number]  NOB:[id number]  DOB - [birth year]  Subjective:  Chief Complaint and HPI: [name] is a 51 y.o. female here today for 3 day h/o urinary frequency and pain with urination.  She was recently in the hospital for gastroparesis/N/V.  She is feeling much better from that aspect.  No back pain.  No f/c.    Blood sugars running bt 90-200.  Denies s/sx of hyper/hypoglycemia and is compliant with meds.  She has an upcoming appt with Dr Jarold Song the first week or so in May.     ROS:   Constitutional:  No f/c, No night sweats, No unexplained weight loss. EENT:  No vision changes, No blurry vision, No hearing changes. No mouth, throat, or ear problems.  Respiratory: No cough, No SOB Cardiac: No CP, no palpitations GI:  No abd pain, No N/V/D. GU: +Urinary s/sx Musculoskeletal: No joint pain Neuro: No headache, no dizziness, no motor weakness.  Skin: No rash Endocrine:  No polydipsia. No polyuria.  Psych: Denies SI/HI  No problems updated.  ALLERGIES: Allergies  Allergen Reactions  . Anesthetics, Amide Nausea And Vomiting  . Penicillins Diarrhea and Nausea And Vomiting    Has patient had a PCN reaction causing immediate rash, facial/tongue/throat swelling, SOB or lightheadedness with hypotension: No Has patient had a PCN reaction causing severe rash involving mucus membranes or skin necrosis: No Has patient had a PCN reaction that required hospitalization No Has patient had a PCN reaction occurring within the last 10 years: Yes  If all of the above answers are "NO", then may proceed with Cephalosporin use.   . Buprenorphine Hcl Rash  . Encainide Nausea And Vomiting    PAST MEDICAL HISTORY: Past Medical History:  Diagnosis Date  . Depression   . Diabetes mellitus without complication ([place])   . Essential hypertension   . Gastroparesis   . GERD (gastroesophageal reflux disease)   . HLD (hyperlipidemia)     MEDICATIONS AT  HOME: Prior to Admission medications   Medication Sig Start Date End Date Taking? Authorizing Provider  albuterol (PROVENTIL HFA;VENTOLIN HFA) 108 (90 Base) MCG/ACT inhaler Inhale 2 puffs into the lungs every 6 (six) hours as needed for wheezing or shortness of breath. Patient not taking: Reported on 09/04/2016 10/20/15   [name], MD  atorvastatin (LIPITOR) 40 MG tablet Take 1 tablet (40 mg total) by mouth daily. 09/04/16   [name], MD  Blood Glucose Monitoring Suppl (TRUE METRIX METER) DEVI 1 each by Does not apply route 3 (three) times daily before meals. 07/19/15   [name], MD  busPIRone (BUSPAR) 10 MG tablet Take 1 tablet (10 mg total) by mouth 2 (two) times daily. 09/04/16   [name], MD  docusate sodium (COLACE) 250 MG capsule Take 1 capsule (250 mg total) by mouth 2 (two) times daily. Patient not taking: Reported on 09/27/2016 03/26/16   Donne Hazel, MD  erythromycin (E-MYCIN) 250 MG tablet Take 1 tablet (250 mg total) by mouth 4 (four) times daily. 09/29/16   Doreatha Lew, MD  famotidine (PEPCID) 20 MG tablet Take 1 tablet (20 mg total) by mouth 2 (two) times daily. 09/29/16   Doreatha Lew, MD  ferrous sulfate 325 (65 FE) MG tablet Take 1 tablet (325 mg total) by mouth daily with breakfast. Patient not taking: Reported on 09/04/2016 03/27/16   Donne Hazel, MD  fluconazole (DIFLUCAN) 150 MG tablet Take 1 tablet (150 mg total)  by mouth once. 10/18/16 10/18/16  Argentina Donovan, PA-C  FLUoxetine (PROZAC) 20 MG tablet Take 2 tablets (40 mg total) by mouth daily. 09/04/16   [name], MD  furosemide (LASIX) 40 MG tablet Take 40 mg by mouth daily as needed for fluid.     Historical Provider, MD  glucose blood (TRUE METRIX BLOOD GLUCOSE TEST) test strip Use 3 times daily before meals 03/13/16   [name], MD  insulin aspart (NOVOLOG) 100 UNIT/ML injection Inject 0-15 Units into the skin 3 (three) times daily with meals. Per sliding scale 09/04/16   [name], MD   insulin detemir (LEVEMIR) 100 UNIT/ML injection Inject 0.15 mLs (15 Units total) into the skin 2 (two) times daily. 09/04/16   [name], MD  Insulin Syringe-Needle U-100 (BD INSULIN SYRINGE ULTRAFINE) 31G X 15/64" 0.5 ML MISC 1 each by Does not apply route 4 (four) times daily as needed. 08/16/15   [name], MD  metoCLOPramide (REGLAN) 10 MG tablet Take 1 tablet (10 mg total) by mouth 3 (three) times daily before meals. 03/26/16   Donne Hazel, MD  nitrofurantoin, macrocrystal-monohydrate, (MACROBID) 100 MG capsule Take 1 capsule (100 mg total) by mouth 2 (two) times daily. 10/18/16   Argentina Donovan, PA-C  ondansetron (ZOFRAN ODT) 4 MG disintegrating tablet Take 1 tablet (4 mg total) by mouth every 8 (eight) hours as needed for nausea or vomiting. 09/04/16   [name], MD  polyethylene glycol (MIRALAX / GLYCOLAX) packet Take 17 g by mouth daily as needed for mild constipation. 09/04/16   [name], MD  pregabalin (LYRICA) 150 MG capsule Take 1 capsule (150 mg total) by mouth 2 (two) times daily. 09/04/16   [name], MD  promethazine (PHENERGAN) 25 MG suppository Place 1 suppository (25 mg total) rectally every 6 (six) hours as needed for nausea or vomiting. 10/01/16   Hannah Muthersbaugh, PA-C     Objective:  EXAM:   Vitals:   10/18/16 1629  BP: (!) 156/78  Pulse: 84  Resp: 16  Temp: 98.1 F (36.7 C)  TempSrc: Oral  SpO2: 99%  Weight: 147 lb (66.7 kg)    General appearance : A&OX3. NAD. Non-toxic-appearing HEENT: Atraumatic and Normocephalic.  PERRLA. EOM intact.   Mouth-MMM Neck: supple, no JVD. No cervical lymphadenopathy. No thyromegaly Chest/Lungs:  Breathing-non-labored, Good air entry bilaterally, breath sounds normal without rales, rhonchi, or wheezing  CVS: S1 S2 regular, no murmurs, gallops, rubs  No CVA TTP Extremities: Bilateral Lower Ext shows no edema, both legs are warm to touch with = pulse throughout Neurology:  CN II-XII grossly intact, Non focal.    Psych:  TP linear. J/I WNL. Normal speech. Appropriate eye contact and affect.  Skin:  No Rash  Data Review Lab Results  Component Value Date   HGBA1C 10.0 (H) 09/27/2016   HGBA1C 9.9 09/04/2016   HGBA1C 10.3 (H) 03/19/2016     Assessment & Plan   1. type 2 diabetes mellitus (HCC) cbg WNL today.  No ketones in urine  - Glucose (CBG)  2. Dysuria See #3 - POCT urinalysis dipstick  3. Acute cystitis with hematuria Push water - Urine culture - fluconazole (DIFLUCAN) 150 MG tablet; Take 1 tablet (150 mg total) by mouth once.  Dispense: 1 tablet; Refill: 0 if needed  - nitrofurantoin, macrocrystal-monohydrate, (MACROBID) 100 MG capsule; Take 1 capsule (100 mg total) by mouth 2 (two) times daily.  Dispense: 10 capsule; Refill: 0   Patient have been counseled extensively  about nutrition and exercise  Return in about 11 days (around 10/29/2016) for keep f/up with Dr Jarold Song as scheduled.  The patient was given clear instructions to go to ER or return to medical center if symptoms don't improve, worsen or new problems develop. The patient verbalized understanding. The patient was told to call to get lab results if they haven't heard anything in the next week.     Freeman Caldron, PA-C [facility] [place], [place]   10/18/2016, 4:43 PMPatient ID: [name], female   DOB: Sep 06, 1965, 51 y.o.   MRN: [id number]

## 2016-10-20 LAB — URINE CULTURE

## 2016-10-22 ENCOUNTER — Emergency Department (HOSPITAL_COMMUNITY)
Admission: EM | Admit: 2016-10-22 | Discharge: 2016-10-22 | Disposition: A | Discharge status: Home / Self Care | Payer: Medicaid Other | Attending: Emergency Medicine | Admitting: Emergency Medicine

## 2016-10-22 DIAGNOSIS — I10 Essential (primary) hypertension: Secondary | ICD-10-CM | POA: Diagnosis not present

## 2016-10-22 DIAGNOSIS — E1043 Type 1 diabetes mellitus with diabetic autonomic (poly)neuropathy: Secondary | ICD-10-CM | POA: Diagnosis not present

## 2016-10-22 DIAGNOSIS — K3184 Gastroparesis: Secondary | ICD-10-CM

## 2016-10-22 DIAGNOSIS — E1040 Type 1 diabetes mellitus with diabetic neuropathy, unspecified: Secondary | ICD-10-CM | POA: Insufficient documentation

## 2016-10-22 DIAGNOSIS — Z79899 Other long term (current) drug therapy: Secondary | ICD-10-CM | POA: Insufficient documentation

## 2016-10-22 DIAGNOSIS — R112 Nausea with vomiting, unspecified: Secondary | ICD-10-CM | POA: Diagnosis present

## 2016-10-22 LAB — COMPREHENSIVE METABOLIC PANEL
ALBUMIN: 4.9 g/dL (ref 3.5–5.0)
ALK PHOS: 109 U/L (ref 38–126)
ALT: 12 U/L — ABNORMAL LOW (ref 14–54)
ANION GAP: 12 (ref 5–15)
AST: 21 U/L (ref 15–41)
BILIRUBIN TOTAL: 1 mg/dL (ref 0.3–1.2)
BUN: 12 mg/dL (ref 6–20)
CALCIUM: 10.2 mg/dL (ref 8.9–10.3)
CO2: 23 mmol/L (ref 22–32)
Chloride: 100 mmol/L — ABNORMAL LOW (ref 101–111)
Creatinine, Ser: 0.98 mg/dL (ref 0.44–1.00)
GFR calc Af Amer: >60 mL/min (ref >60)
GLUCOSE: 146 mg/dL — AB (ref 65–99)
Potassium: 3.9 mmol/L (ref 3.5–5.1)
Sodium: 135 mmol/L (ref 135–145)
TOTAL PROTEIN: 9.8 g/dL — AB (ref 6.5–8.1)

## 2016-10-22 LAB — CBC
HCT: 39.4 % (ref 36.0–46.0)
Hemoglobin: 12.9 g/dL (ref 12.0–15.0)
MCH: 24.9 pg — ABNORMAL LOW (ref 26.0–34.0)
MCHC: 32.7 g/dL (ref 30.0–36.0)
MCV: 75.9 fL — ABNORMAL LOW (ref 78.0–100.0)
Platelets: 495 10*3/uL — ABNORMAL HIGH (ref 150–400)
RBC: 5.19 MIL/uL — ABNORMAL HIGH (ref 3.87–5.11)
RDW: 14.9 % (ref 11.5–15.5)
WBC: 6.7 10*3/uL (ref 4.0–10.5)

## 2016-10-22 LAB — URINALYSIS, ROUTINE W REFLEX MICROSCOPIC
Bacteria, UA: NONE SEEN
Bilirubin Urine: NEGATIVE
GLUCOSE, UA: 50 mg/dL — AB
HGB URINE DIPSTICK: NEGATIVE
Ketones, ur: 20 mg/dL — AB
Leukocytes, UA: NEGATIVE
NITRITE: NEGATIVE
PH: 7 (ref 5.0–8.0)
Protein, ur: >=300 mg/dL — AB
SPECIFIC GRAVITY, URINE: 1.017 (ref 1.005–1.030)

## 2016-10-22 LAB — I-STAT BETA HCG BLOOD, ED (MC, WL, AP ONLY): I-stat hCG, quantitative: <5 m[IU]/mL (ref <5)

## 2016-10-22 LAB — LIPASE, BLOOD: Lipase: 29 U/L (ref 11–51)

## 2016-10-22 MED ORDER — LORAZEPAM 2 MG/ML IJ SOLN
1.0000 mg | Freq: Once | INTRAMUSCULAR | Status: AC
  Administered 2016-10-22: 1 mg via INTRAMUSCULAR

## 2016-10-22 MED ORDER — DIPHENHYDRAMINE HCL 50 MG/ML IJ SOLN: 25.0000 mg | Freq: Once | INTRAMUSCULAR | Status: DC

## 2016-10-22 MED ORDER — LORAZEPAM 2 MG/ML IJ SOLN
1.0000 mg | Freq: Once | INTRAMUSCULAR | Status: DC
  Filled 2016-10-22: qty 1

## 2016-10-22 MED ORDER — METOCLOPRAMIDE HCL 5 MG/ML IJ SOLN
10.0000 mg | Freq: Once | INTRAMUSCULAR | Status: AC
  Administered 2016-10-22: 10 mg via INTRAVENOUS
  Filled 2016-10-22: qty 2

## 2016-10-22 MED ORDER — DIPHENHYDRAMINE HCL 50 MG/ML IJ SOLN
25.0000 mg | Freq: Once | INTRAMUSCULAR | Status: DC
  Filled 2016-10-22: qty 1

## 2016-10-22 MED ORDER — HALOPERIDOL LACTATE 5 MG/ML IJ SOLN
2.0000 mg | Freq: Once | INTRAMUSCULAR | Status: AC
  Administered 2016-10-22: 2 mg via INTRAMUSCULAR
  Filled 2016-10-22: qty 1

## 2016-10-22 NOTE — Discharge Instructions (Signed)
Please return to the Emergency Department for new or worsening symptoms. Please follow up with Internal Medicine after today's visit. Please continue to take all of your medications for your urinary tract infection.

## 2016-10-22 NOTE — ED Notes (Signed)
[name]. UNDERSTANDS PLAN OF CARE. SHE STATES SHE IS TRYING TO CALL HER SON FOR A RIDE HOME.  NO EMESIS AT PRESENT. CONTINUES TO SIP ON GINGER-ALE.

## 2016-10-22 NOTE — ED Notes (Addendum)
PT DID NOT VOMIT DURING IV ACCESS. PT ONLY VOMITED DURING EDPA REPEAT EVALUATION AND AT MY EXIT. AGAIN CLEAR WITH SCANT BILE. PT'S WITH IMMEDIATE RECOVERY AND WITHOUT COMPLAINT. DISCUSSED WITH PT I WOULD SPEAK WITH EDPA ABOUT IM INJECTIONS

## 2016-10-22 NOTE — ED Notes (Signed)
DX at office visit with UTI. She states she has been improving. Today presents with only symptoms associated gastroparesis. This started this morning.

## 2016-10-22 NOTE — ED Notes (Signed)
Bed: IZ12 Expected date:  Expected time:  Means of arrival:  Comments: EMS stomach pain

## 2016-10-22 NOTE — ED Notes (Signed)
PT ONLY VOMITED CLEAR BILE WITH EDPA PRESENT OTHERWISE PT CALM AND WITHOUT COMPLAINT

## 2016-10-22 NOTE — ED Notes (Signed)
ED Provider at bedside. 

## 2016-10-22 NOTE — ED Notes (Signed)
Patient sipping on water to help facilitate urine sample.

## 2016-10-22 NOTE — ED Provider Notes (Signed)
[place] DEPT Provider Note   CSN: [id number] Arrival date & time: 10/22/16  1311     History   Chief Complaint Chief Complaint  Patient presents with  . Abdominal Pain  . Nausea  . Emesis    HPI [name] is a 51 y.o. female with a h/o of Type I DM and gastroparesis who presents to the Emergency Department with nausea, vomiting, diarrhea x5 that began this morning. She also complains of generalized abdominal pain.She reports the currently symptoms feel like her usual gastroparesis and reports good success with the regimen during her last ED visit. No fever, chills, hematemesis, hematochezia, or melena. She reports she is currently being treated for a UTI by her PCP.  The history is provided by the patient. No language interpreter was used.    Past Medical History:  Diagnosis Date  . Depression   . Diabetes mellitus without complication ([place])   . Essential hypertension   . Gastroparesis   . GERD (gastroesophageal reflux disease)   . HLD (hyperlipidemia)     Patient Active Problem List   Diagnosis Date Noted  . Gastroparesis 04/20/2016  . Type 1 diabetes mellitus with ketoacidosis without coma ([place])   . DKA (diabetic ketoacidoses) ([place]) 03/18/2016  . Noncompliance with treatment plan 03/13/2016  . Essential hypertension 01/24/2016  . Leukocytosis 01/24/2016  . Abnormal ECG 11/27/2015  . HLD (hyperlipidemia)   . GERD (gastroesophageal reflux disease)   . Metabolic acidosis, normal anion gap (NAG)   . AKI (acute kidney injury) ([place])   . Anemia, iron deficiency   . DKA, type 1, not at goal [facility])   . Hyponatremia 09/02/2015  . Vitamin B12 deficiency 08/16/2015  . Lactic acidosis 07/29/2015  . Acute pyelonephritis 07/29/2015  . Pyelonephritis   . Hypothermia   . Diabetic gastroparesis ([place])   . Diabetic neuropathy, type I diabetes mellitus ([place]) 05/18/2015  . Anemia 05/18/2015  . Anxiety and depression 05/18/2015  . Nausea & vomiting   . Diabetic  ketoacidosis without coma associated with type 1 diabetes mellitus ([place])   . High anion gap metabolic acidosis 62/13/0865    Past Surgical History:  Procedure Laterality Date  . COLONOSCOPY  09/27/2014   at [facility]  . ESOPHAGOGASTRODUODENOSCOPY  09/27/2014   at [facility], Dr Rolan Lipa. biospy neg for celiac, neg for H pylori.   . EYE SURGERY    . gailstones    . IR GENERIC HISTORICAL  01/24/2016   IR FLUORO GUIDE CV LINE RIGHT 01/24/2016 Darrell K Allred, PA-C [facility]  . IR GENERIC HISTORICAL  01/24/2016   IR US GUIDE VASC ACCESS RIGHT 01/24/2016 Darrell K Allred, PA-C [facility]  . POSTERIOR VITRECTOMY AND MEMBRANE PEEL-LEFT EYE  09/28/2002  . POSTERIOR VITRECTOMY AND MEMBRANE PEEL-RIGHT EYE  03/16/2002  . RETINAL DETACHMENT SURGERY      OB History    No data available       Home Medications    Prior to Admission medications   Medication Sig Start Date End Date Taking? Authorizing Provider  atorvastatin (LIPITOR) 40 MG tablet Take 1 tablet (40 mg total) by mouth daily. 09/04/16  Yes [name], MD  busPIRone (BUSPAR) 10 MG tablet Take 1 tablet (10 mg total) by mouth 2 (two) times daily. 09/04/16  Yes [name], MD  erythromycin (E-MYCIN) 250 MG tablet Take 1 tablet (250 mg total) by mouth 4 (four) times daily. 09/29/16  Yes Doreatha Lew, MD  famotidine (PEPCID) 20 MG tablet Take 1 tablet (  20 mg total) by mouth 2 (two) times daily. 09/29/16  Yes Doreatha Lew, MD  FLUoxetine (PROZAC) 20 MG tablet Take 2 tablets (40 mg total) by mouth daily. 09/04/16  Yes [name], MD  furosemide (LASIX) 40 MG tablet Take 40 mg by mouth daily as needed for fluid.    Yes Historical Provider, MD  insulin aspart (NOVOLOG) 100 UNIT/ML injection Inject 0-15 Units into the skin 3 (three) times daily with meals. Per sliding scale 09/04/16  Yes [name], MD  insulin detemir (LEVEMIR) 100 UNIT/ML injection Inject 0.15 mLs (15 Units total) into the skin 2 (two) times daily. 09/04/16  Yes [name], MD  metoCLOPramide (REGLAN) 10 MG tablet Take 1 tablet (10 mg total) by mouth 3 (three) times daily before meals. 03/26/16  Yes Donne Hazel, MD  nitrofurantoin, macrocrystal-monohydrate, (MACROBID) 100 MG capsule Take 1 capsule (100 mg total) by mouth 2 (two) times daily. 10/18/16  Yes Dionne Bucy McClung, PA-C  ondansetron (ZOFRAN ODT) 4 MG disintegrating tablet Take 1 tablet (4 mg total) by mouth every 8 (eight) hours as needed for nausea or vomiting. 09/04/16  Yes [name], MD  pregabalin (LYRICA) 150 MG capsule Take 1 capsule (150 mg total) by mouth 2 (two) times daily. 09/04/16  Yes [name], MD  albuterol (PROVENTIL HFA;VENTOLIN HFA) 108 (90 Base) MCG/ACT inhaler Inhale 2 puffs into the lungs every 6 (six) hours as needed for wheezing or shortness of breath. Patient not taking: Reported on 09/04/2016 10/20/15   [name], MD  Blood Glucose Monitoring Suppl (TRUE METRIX METER) DEVI 1 each by Does not apply route 3 (three) times daily before meals. 07/19/15   [name], MD  docusate sodium (COLACE) 250 MG capsule Take 1 capsule (250 mg total) by mouth 2 (two) times daily. Patient not taking: Reported on 09/27/2016 03/26/16   Donne Hazel, MD  ferrous sulfate 325 (65 FE) MG tablet Take 1 tablet (325 mg total) by mouth daily with breakfast. Patient not taking: Reported on 09/04/2016 03/27/16   Donne Hazel, MD  glucose blood (TRUE METRIX BLOOD GLUCOSE TEST) test strip Use 3 times daily before meals 03/13/16   [name], MD  Insulin Syringe-Needle U-100 (BD INSULIN SYRINGE ULTRAFINE) 31G X 15/64" 0.5 ML MISC 1 each by Does not apply route 4 (four) times daily as needed. 08/16/15   [name], MD  polyethylene glycol (MIRALAX / GLYCOLAX) packet Take 17 g by mouth daily as needed for mild constipation. Patient not taking: Reported on 10/22/2016 09/04/16   [name], MD  promethazine (PHENERGAN) 25 MG suppository Place 1 suppository (25 mg total) rectally every 6 (six) hours as needed  for nausea or vomiting. Patient not taking: Reported on 10/22/2016 10/01/16   Jarrett Soho Muthersbaugh, PA-C    Family History Family History  Problem Relation Age of Onset  . Cystic fibrosis Mother   . Hypertension Father   . Diabetes Brother   . Hypertension Maternal Grandmother     Social History Social History  Substance Use Topics  . Smoking status: Never Smoker  . Smokeless tobacco: Never Used  . Alcohol use No     Allergies   Anesthetics, amide; Penicillins; Buprenorphine hcl; and Encainide   Review of Systems Review of Systems  Constitutional: Negative for activity change.  Respiratory: Negative for shortness of breath.   Cardiovascular: Negative for chest pain.  Gastrointestinal: Positive for abdominal pain, diarrhea, nausea and vomiting. Negative for blood in stool.  Musculoskeletal: Negative  for back pain.  Skin: Negative for rash.  Psychiatric/Behavioral: Negative for confusion.     Physical Exam Updated Vital Signs BP 137/69 (BP Location: Left Arm)   Pulse 91   Temp 98 F (36.7 C) (Oral)   Resp 16   Ht 5\' 1"  (1.549 m)   Wt 66.7 kg   SpO2 100%   BMI 27.78 kg/m   Physical Exam  Constitutional: She appears well-developed and well-nourished. No distress.  Appears distressed. Actively dry heaving and gagging.   HENT:  Head: Normocephalic and atraumatic.  Eyes: Conjunctivae are normal.  Neck: Neck supple.  Cardiovascular: Normal rate, regular rhythm, normal heart sounds and intact distal pulses.  Exam reveals no gallop and no friction rub.   No murmur heard. Pulmonary/Chest: Effort normal and breath sounds normal. No respiratory distress. She has no wheezes. She has no rales.  Abdominal: Soft. She exhibits no distension. There is tenderness. There is no guarding.  Generalized tenderness.   Musculoskeletal: She exhibits no edema or tenderness.  Neurological: She is alert.  Skin: Skin is warm and dry. No rash noted. She is not diaphoretic.  Psychiatric:  Her behavior is normal.  Nursing note and vitals reviewed.  ED Treatments / Results  Labs (all labs ordered are listed, but only abnormal results are displayed) Labs Reviewed  COMPREHENSIVE METABOLIC PANEL - Abnormal; Notable for the following:       Result Value   Chloride 100 (*)    Glucose, Bld 146 (*)    Total Protein 9.8 (*)    ALT 12 (*)    All other components within normal limits  CBC - Abnormal; Notable for the following:    RBC 5.19 (*)    MCV 75.9 (*)    MCH 24.9 (*)    Platelets 495 (*)    All other components within normal limits  URINALYSIS, ROUTINE W REFLEX MICROSCOPIC - Abnormal; Notable for the following:    APPearance HAZY (*)    Glucose, UA 50 (*)    Ketones, ur 20 (*)    Protein, ur >=300 (*)    Squamous Epithelial / LPF 6-30 (*)    All other components within normal limits  LIPASE, BLOOD  I-STAT BETA HCG BLOOD, ED ([facility], [facility], [facility] ONLY)    EKG  EKG Interpretation None       Radiology No results found.  Procedures Procedures (including critical care time)  Medications Ordered in ED Medications  LORazepam (ATIVAN) injection 1 mg (1 mg Intravenous Not Given 10/22/16 1448)  diphenhydrAMINE (BENADRYL) injection 25 mg (25 mg Intravenous Not Given 10/22/16 1448)  diphenhydrAMINE (BENADRYL) injection 25 mg (25 mg Intramuscular Not Given 10/22/16 1648)  metoCLOPramide (REGLAN) injection 10 mg (10 mg Intravenous Given 10/22/16 1448)  haloperidol lactate (HALDOL) injection 2 mg (2 mg Intramuscular Given 10/22/16 1602)  LORazepam (ATIVAN) injection 1 mg (1 mg Intramuscular Given 10/22/16 1651)     Initial Impression / Assessment and Plan / ED Course  I have reviewed the triage vital signs and the nursing notes.  Pertinent labs & imaging results that were available during my care of the patient were reviewed by me and considered in my medical decision making (see chart for details).     51 year old female with a h/o of DM and gastroparesis presents with  vomiting since this morning. She is non-tachycardic and does not appear to be clinically dehydrated. No hypotension; mild hypertension noted. No dyspnea or chest pain. Glucose is elevated to 146, but no signs  of DKA. Haldol, benadryl, and Reglan administered in the ED. IV access was attempted 3 times. Discussed the patient with Dr. Wilson Singer, who recommended PO challenging the patient, and if she was able to keep down PO fluids then IVF could be d/c'ed. No emesis or retching since the administration of haldol. Will de discharged to home. Return precautions to the ED discussed. Patient reports she has all of her home medications so no home prescriptions needed this visit. VSS. NAD.   Final Clinical Impressions(s) / ED Diagnoses   Final diagnoses:  Gastroparesis    New Prescriptions New Prescriptions   No medications on file     [place], PA-C 10/25/16 0135    [name], MD 10/28/16 1014

## 2016-10-22 NOTE — ED Notes (Signed)
EDPA Provider at bedside. 

## 2016-10-22 NOTE — ED Notes (Signed)
Pt was able to tolerated morning medication

## 2016-10-22 NOTE — ED Notes (Signed)
Patient is still in too much pain to give sample at this time. Patient will call out when ready.

## 2016-10-22 NOTE — ED Notes (Signed)
DIET GINGER-ALE GIVEN. ASSESSED EMPTY EMESIS BAG WITH PATIENT. PT STATES HER NAUSEA HAS IMPROVED. PT TAKING ON CELL PHONE. INSTRUCTED TO TAKE SMALL SIPS. PT VERBALIZED UNDERSTANDING

## 2016-10-22 NOTE — ED Notes (Addendum)
ATTEMPTED IV ACCESS WITH [place] NOT SUCCESSFUL. EDPA MADE AWARE. AWARE ONLY REGLAN GIVEN. ATIVAN AND BENADRYL NOT GIVEN. AWAITING ORDERS

## 2016-10-23 ENCOUNTER — Telehealth: Payer: Self-pay

## 2016-10-23 NOTE — Telephone Encounter (Signed)
Contacted pt to go over urine results pt didn't answer lvm asking pt to give me a call at her earliest convenience   If pt calls back please give results: urine culture confirmed infection. She is on the correct antibiotic and should make sure she completes the course!

## 2016-10-30 ENCOUNTER — Ambulatory Visit: Payer: Medicaid Other | Attending: Family Medicine | Admitting: Family Medicine

## 2016-10-30 ENCOUNTER — Other Ambulatory Visit: Payer: Self-pay | Admitting: Pharmacist

## 2016-10-30 ENCOUNTER — Ambulatory Visit: Payer: Medicaid Other

## 2016-10-30 ENCOUNTER — Encounter: Payer: Self-pay | Admitting: Family Medicine

## 2016-10-30 VITALS — BP 131/72 | HR 91 | Temp 98.2°F | Resp 18 | Ht 62.0 in | Wt 142.0 lb

## 2016-10-30 DIAGNOSIS — F419 Anxiety disorder, unspecified: Secondary | ICD-10-CM

## 2016-10-30 DIAGNOSIS — K3184 Gastroparesis: Secondary | ICD-10-CM | POA: Diagnosis not present

## 2016-10-30 DIAGNOSIS — E1010 Type 1 diabetes mellitus with ketoacidosis without coma: Secondary | ICD-10-CM

## 2016-10-30 DIAGNOSIS — Z79899 Other long term (current) drug therapy: Secondary | ICD-10-CM | POA: Diagnosis not present

## 2016-10-30 DIAGNOSIS — E1040 Type 1 diabetes mellitus with diabetic neuropathy, unspecified: Secondary | ICD-10-CM | POA: Diagnosis not present

## 2016-10-30 DIAGNOSIS — F32A Depression, unspecified: Secondary | ICD-10-CM

## 2016-10-30 DIAGNOSIS — E1143 Type 2 diabetes mellitus with diabetic autonomic (poly)neuropathy: Secondary | ICD-10-CM

## 2016-10-30 DIAGNOSIS — Z30011 Encounter for initial prescription of contraceptive pills: Secondary | ICD-10-CM

## 2016-10-30 DIAGNOSIS — E10649 Type 1 diabetes mellitus with hypoglycemia without coma: Secondary | ICD-10-CM | POA: Insufficient documentation

## 2016-10-30 DIAGNOSIS — F329 Major depressive disorder, single episode, unspecified: Secondary | ICD-10-CM | POA: Diagnosis not present

## 2016-10-30 LAB — GLUCOSE, POCT (MANUAL RESULT ENTRY)
POC GLUCOSE: 38 mg/dL — AB (ref 70–99)
POC Glucose: 48 mg/dl — AB (ref 70–99)
POC Glucose: 73 mg/dl (ref 70–99)

## 2016-10-30 MED ORDER — BUSPIRONE HCL 10 MG PO TABS: 10.0000 mg | ORAL_TABLET | Freq: Two times a day (BID) | ORAL | 3 refills | Status: DC

## 2016-10-30 MED ORDER — ACCU-CHEK AVIVA DEVI: 0 refills | Status: DC

## 2016-10-30 MED ORDER — METOCLOPRAMIDE HCL 10 MG PO TABS: 10.0000 mg | ORAL_TABLET | Freq: Three times a day (TID) | ORAL | 0 refills | Status: DC

## 2016-10-30 MED ORDER — ATORVASTATIN CALCIUM 40 MG PO TABS: 40.0000 mg | ORAL_TABLET | Freq: Every day | ORAL | 3 refills | Status: DC

## 2016-10-30 MED ORDER — GLUCOSE BLOOD VI STRP: ORAL_STRIP | 12 refills | Status: DC

## 2016-10-30 MED ORDER — GLUCOSE BLOOD VI STRP
ORAL_STRIP | 12 refills | Status: DC
Start: 2016-10-30 — End: 2017-04-04

## 2016-10-30 MED ORDER — INSULIN ASPART 100 UNIT/ML ~~LOC~~ SOLN: 0.0000 [IU] | Freq: Three times a day (TID) | SUBCUTANEOUS | 3 refills | Status: DC

## 2016-10-30 MED ORDER — FLUOXETINE HCL 20 MG PO TABS: 40.0000 mg | ORAL_TABLET | Freq: Every day | ORAL | 3 refills | Status: DC

## 2016-10-30 MED ORDER — INSULIN DETEMIR 100 UNIT/ML ~~LOC~~ SOLN: 20.0000 [IU] | Freq: Two times a day (BID) | SUBCUTANEOUS | 11 refills | Status: DC

## 2016-10-30 MED ORDER — NORETHINDRONE 0.35 MG PO TABS: 1.0000 | ORAL_TABLET | Freq: Every day | ORAL | 11 refills | Status: DC

## 2016-10-30 MED ORDER — ACCU-CHEK SOFTCLIX LANCET DEV MISC: 5 refills | Status: DC

## 2016-10-30 MED FILL — METOCLOPRAMIDE 10 MG TABLET: 10 | 30 days supply | Qty: 90 | Fill #0

## 2016-10-30 MED FILL — busPIRone HCL 10 MG TABS: 10 | 30 days supply | Qty: 60 | Fill #0

## 2016-10-30 MED FILL — NovoLOG 100 UNIT/ML SOLN: 100 | 30 days supply | Qty: 10 | Fill #0

## 2016-10-30 MED FILL — LEVEMIR 100 UNITS/ML VIAL: 100 | 25 days supply | Qty: 10 | Fill #0

## 2016-10-30 MED FILL — FLUoxetine HCL 20 MG CAPS: 20 | 30 days supply | Qty: 60 | Fill #0

## 2016-10-30 MED FILL — ATORVASTATIN 40 MG TABLET: 40 | 30 days supply | Qty: 30 | Fill #0

## 2016-10-30 NOTE — Progress Notes (Signed)
Patient is here for FU  Patient denies pain at this time.  Patient has taken medication today. (12 units around 9:00 am) Patient has eaten breakfast: eggs, grits and a banana)  Patient request test strip refills.

## 2016-10-30 NOTE — Progress Notes (Signed)
Subjective:  Patient ID: [name], female    DOB: [birth year]  Age: 51 y.o. MRN: [id number]  CC: Depression   HPI [name] is a 51 year old female with a history of uncontrolled type 1 diabetes mellitus (A1c 10.0), diabetic neuropathy, diabetic gastroparesis, hypertension, anemia, anxiety and depression, multiple ED visits and hospitalizations for diabetic gastroparesis here for a follow-up visit.  Her blood sugar in the clinic today is 38 and she endorses taking 12 units of NovoLog due to fasting sugar of 296. She has been taking her Levemir 15 units twice daily. Resisted increasing Levemir dose in the past and she has insisted on remaining on the Sliding scale which she received from her previous PCP. Her nausea and vomiting waxes and wanes with the last episode yesterday where she had only nausea but did not vomit; today's a good day for her.  She Complains of difficulty obtaining food to eat as she is currently unemployed. She has been obtaining food systems lasting only 3 months. This is causing her much depression coupled with other psychosocial stressors; she denies suicidal ideation or intention.  She was recently approved for Medicaid and would like to be seen by an endocrinologist.  Requesting placement on birth control pills as she has a new boyfriend.  Past Medical History:  Diagnosis Date  . Depression   . Diabetes mellitus without complication ([place])   . Essential hypertension   . Gastroparesis   . GERD (gastroesophageal reflux disease)   . HLD (hyperlipidemia)     Past Surgical History:  Procedure Laterality Date  . COLONOSCOPY  09/27/2014   at [facility]  . ESOPHAGOGASTRODUODENOSCOPY  09/27/2014   at [facility], Dr Rolan Lipa. biospy neg for celiac, neg for H pylori.   . EYE SURGERY    . gailstones    . IR GENERIC HISTORICAL  01/24/2016   IR FLUORO GUIDE CV LINE RIGHT 01/24/2016 Darrell K Allred, PA-C [facility]  . IR GENERIC HISTORICAL  01/24/2016   IR US GUIDE  VASC ACCESS RIGHT 01/24/2016 Darrell K Allred, PA-C [facility]  . POSTERIOR VITRECTOMY AND MEMBRANE PEEL-LEFT EYE  09/28/2002  . POSTERIOR VITRECTOMY AND MEMBRANE PEEL-RIGHT EYE  03/16/2002  . RETINAL DETACHMENT SURGERY      Allergies  Allergen Reactions  . Anesthetics, Amide Nausea And Vomiting  . Penicillins Diarrhea and Nausea And Vomiting    Has patient had a PCN reaction causing immediate rash, facial/tongue/throat swelling, SOB or lightheadedness with hypotension: No Has patient had a PCN reaction causing severe rash involving mucus membranes or skin necrosis: No Has patient had a PCN reaction that required hospitalization No Has patient had a PCN reaction occurring within the last 10 years: Yes  If all of the above answers are "NO", then may proceed with Cephalosporin use.   . Buprenorphine Hcl Rash  . Encainide Nausea And Vomiting     Outpatient Medications Prior to Visit  Medication Sig Dispense Refill  . albuterol (PROVENTIL HFA;VENTOLIN HFA) 108 (90 Base) MCG/ACT inhaler Inhale 2 puffs into the lungs every 6 (six) hours as needed for wheezing or shortness of breath. 1 Inhaler 0  . Blood Glucose Monitoring Suppl (TRUE METRIX METER) DEVI 1 each by Does not apply route 3 (three) times daily before meals. 1 Device 0  . famotidine (PEPCID) 20 MG tablet Take 1 tablet (20 mg total) by mouth 2 (two) times daily. 60 tablet 0  . furosemide (LASIX) 40 MG tablet Take 40 mg by mouth daily as needed for  fluid.     . Insulin Syringe-Needle U-100 (BD INSULIN SYRINGE ULTRAFINE) 31G X 15/64" 0.5 ML MISC 1 each by Does not apply route 4 (four) times daily as needed. 120 each 5  . pregabalin (LYRICA) 150 MG capsule Take 1 capsule (150 mg total) by mouth 2 (two) times daily. 60 capsule 3  . atorvastatin (LIPITOR) 40 MG tablet Take 1 tablet (40 mg total) by mouth daily. 30 tablet 3  . busPIRone (BUSPAR) 10 MG tablet Take 1 tablet (10 mg total) by mouth 2 (two) times daily. 60 tablet 2  . FLUoxetine  (PROZAC) 20 MG tablet Take 2 tablets (40 mg total) by mouth daily. 60 tablet 3  . glucose blood (TRUE METRIX BLOOD GLUCOSE TEST) test strip Use 3 times daily before meals 100 each 5  . insulin aspart (NOVOLOG) 100 UNIT/ML injection Inject 0-15 Units into the skin 3 (three) times daily with meals. Per sliding scale 50 mL 3  . insulin detemir (LEVEMIR) 100 UNIT/ML injection Inject 0.15 mLs (15 Units total) into the skin 2 (two) times daily. 30 mL 11  . metoCLOPramide (REGLAN) 10 MG tablet Take 1 tablet (10 mg total) by mouth 3 (three) times daily before meals. 90 tablet 0  . docusate sodium (COLACE) 250 MG capsule Take 1 capsule (250 mg total) by mouth 2 (two) times daily. (Patient not taking: Reported on 09/27/2016) 60 capsule 0  . ferrous sulfate 325 (65 FE) MG tablet Take 1 tablet (325 mg total) by mouth daily with breakfast. (Patient not taking: Reported on 09/04/2016) 30 tablet 0  . ondansetron (ZOFRAN ODT) 4 MG disintegrating tablet Take 1 tablet (4 mg total) by mouth every 8 (eight) hours as needed for nausea or vomiting. 60 tablet 2  . polyethylene glycol (MIRALAX / GLYCOLAX) packet Take 17 g by mouth daily as needed for mild constipation. (Patient not taking: Reported on 10/22/2016) 100 each 0  . promethazine (PHENERGAN) 25 MG suppository Place 1 suppository (25 mg total) rectally every 6 (six) hours as needed for nausea or vomiting. (Patient not taking: Reported on 10/22/2016) 10 suppository 0  . erythromycin (E-MYCIN) 250 MG tablet Take 1 tablet (250 mg total) by mouth 4 (four) times daily. 120 tablet 0  . nitrofurantoin, macrocrystal-monohydrate, (MACROBID) 100 MG capsule Take 1 capsule (100 mg total) by mouth 2 (two) times daily. 10 capsule 0   No facility-administered medications prior to visit.     ROS Review of Systems  Constitutional: Negative for activity change, appetite change and fatigue.  HENT: Negative for congestion, sinus pressure and sore throat.   Eyes: Negative for visual  disturbance.  Respiratory: Negative for cough, chest tightness, shortness of breath and wheezing.   Cardiovascular: Negative for chest pain and palpitations.  Gastrointestinal: Positive for nausea. Negative for abdominal distention, abdominal pain and constipation.  Endocrine: Negative for polydipsia.  Genitourinary: Negative for dysuria and frequency.  Musculoskeletal: Negative for arthralgias and back pain.  Skin: Negative for rash.  Neurological: Positive for numbness. Negative for tremors and light-headedness.  Hematological: Does not bruise/bleed easily.  Psychiatric/Behavioral: Positive for dysphoric mood. Negative for agitation and behavioral problems.    Objective:  BP 131/72 (BP Location: Left Arm, Patient Position: Sitting, Cuff Size: Normal)   Pulse 91   Temp 98.2 F (36.8 C) (Oral)   Resp 18   Ht 5\' 2"  (1.575 m)   Wt 142 lb (64.4 kg)   SpO2 100%   BMI 25.97 kg/m   BP/Weight 10/30/2016 10/22/2016 10/18/2016  Systolic BP 694 854 627  Diastolic BP 72 74 78  Wt. (Lbs) 142 147 147  BMI 25.97 27.78 27.78      Physical Exam  Constitutional: She is oriented to person, place, and time. She appears well-developed and well-nourished.  Neck: No JVD present.  Cardiovascular: Normal rate, normal heart sounds and intact distal pulses.   No murmur heard. Pulmonary/Chest: Effort normal and breath sounds normal. She has no wheezes. She has no rales. She exhibits no tenderness.  Abdominal: Soft. Bowel sounds are normal. She exhibits no distension and no mass. There is no tenderness.  Musculoskeletal: Normal range of motion.  Neurological: She is alert and oriented to person, place, and time.  Skin: Skin is warm and dry.  Psychiatric: She has a normal mood and affect.     Lab Results  Component Value Date   HGBA1C 10.0 (H) 09/27/2016    Assessment & Plan:   1. Type 1 diabetes mellitus with ketoacidosis without coma ([place]) Uncontrolled with A1c of 10.0 Currently  hypoglycemic in the clinic with blood sugar of 38 and asymptomatic; provided with crackers and recheck after was 48 then repeat after one hour was 29. Given juice and crackers again Hypoglycemia due to excess NovoLog administration  Increase Levemir dose from 15 units twice daily to 20 units twice daily Changed patient's sliding scale to 0-12 units 3 times daily before meals.  For blood sugar 0-150 give 0 units of insulin, 151-200 give 2 units, 201 to 250 give 4 units, 251-300 give 6 units, 301-350 give 8 units, 351-400 give 10 units, greater than 400 give 12 units.  - Glucose (CBG) - glucose blood (ACCU-CHEK AVIVA) test strip; Use as instructed 3 times daily before meals  Dispense: 100 each; Refill: 12 - Blood Glucose Monitoring Suppl (ACCU-CHEK AVIVA) device; Use as instructed 3 times daily before meals  Dispense: 1 each; Refill: 0 - Lancet Devices (ACCU-CHEK SOFTCLIX) lancets; Use as instructed 3 times daily before meals  Dispense: 1 each; Refill: 5 - Ambulatory referral to Endocrinology - Ambulatory referral to Podiatry - Ambulatory referral to Ophthalmology  2. Anxiety and depression Strongly encouraged compliance Depression due to underlying uncontrolled gastroparesis - busPIRone (BUSPAR) 10 MG tablet; Take 1 tablet (10 mg total) by mouth 2 (two) times daily.  Dispense: 60 tablet; Refill: 3 - FLUoxetine (PROZAC) 20 MG tablet; Take 2 tablets (40 mg total) by mouth daily.  Dispense: 60 tablet; Refill: 3  3. Diabetic ketoacidosis without coma associated with type 1 diabetes mellitus ([facility]) - atorvastatin (LIPITOR) 40 MG tablet; Take 1 tablet (40 mg total) by mouth daily.  Dispense: 30 tablet; Refill: 3 - insulin detemir (LEVEMIR) 100 UNIT/ML injection; Inject 0.2 mLs (20 Units total) into the skin 2 (two) times daily.  Dispense: 30 mL; Refill: 11 - insulin aspart (NOVOLOG) 100 UNIT/ML injection; Inject 0-12 Units into the skin 3 (three) times daily with meals. Per sliding scale  Dispense:  50 mL; Refill: 3  4. Diabetic neuropathy, type I diabetes mellitus ([place]) Controlled with Lyrica  5. Diabetic gastroparesis ([place]) Uncontrolled Will refer to GI for optimization Continue Zofran and Reglan - metoCLOPramide (REGLAN) 10 MG tablet; Take 1 tablet (10 mg total) by mouth 3 (three) times daily before meals.  Dispense: 90 tablet; Refill: 0 - Ambulatory referral to Gastroenterology  6. Need for contraception She requests oral contraceptive pill Placed on progesterone only contraceptive due to less risk compared with combined hormonal contraceptive.  Meds ordered this encounter  Medications  .  busPIRone (BUSPAR) 10 MG tablet    Sig: Take 1 tablet (10 mg total) by mouth 2 (two) times daily.    Dispense:  60 tablet    Refill:  3    Discontinue previous dose  . atorvastatin (LIPITOR) 40 MG tablet    Sig: Take 1 tablet (40 mg total) by mouth daily.    Dispense:  30 tablet    Refill:  3  . FLUoxetine (PROZAC) 20 MG tablet    Sig: Take 2 tablets (40 mg total) by mouth daily.    Dispense:  60 tablet    Refill:  3    Discontinue previous dose  . insulin detemir (LEVEMIR) 100 UNIT/ML injection    Sig: Inject 0.2 mLs (20 Units total) into the skin 2 (two) times daily.    Dispense:  30 mL    Refill:  11    Discontinue previous dose  . metoCLOPramide (REGLAN) 10 MG tablet    Sig: Take 1 tablet (10 mg total) by mouth 3 (three) times daily before meals.    Dispense:  90 tablet    Refill:  0  . glucose blood (ACCU-CHEK AVIVA) test strip    Sig: Use as instructed 3 times daily before meals    Dispense:  100 each    Refill:  12  . Blood Glucose Monitoring Suppl (ACCU-CHEK AVIVA) device    Sig: Use as instructed 3 times daily before meals    Dispense:  1 each    Refill:  0  . Lancet Devices (ACCU-CHEK SOFTCLIX) lancets    Sig: Use as instructed 3 times daily before meals    Dispense:  1 each    Refill:  5  . insulin aspart (NOVOLOG) 100 UNIT/ML injection    Sig: Inject 0-12  Units into the skin 3 (three) times daily with meals. Per sliding scale    Dispense:  50 mL    Refill:  3    For blood sugars of 0-150 units give 0 units, 151-200 give 2 units, 201-250 give 4 units, 251-300 give 6 units, 301-350 give 8 units, 351-400 give 10 units, greater than 400 give 12 units.    Follow-up: Return in about 3 months (around 01/30/2017) for For follow-up of chronic medical conditions.   [name] MD

## 2016-10-30 NOTE — Patient Instructions (Signed)

## 2016-10-31 ENCOUNTER — Encounter: Payer: Self-pay | Admitting: Family Medicine

## 2016-11-01 ENCOUNTER — Other Ambulatory Visit: Payer: Self-pay | Admitting: *Deleted

## 2016-11-01 DIAGNOSIS — E1040 Type 1 diabetes mellitus with diabetic neuropathy, unspecified: Secondary | ICD-10-CM

## 2016-11-01 MED ORDER — PREGABALIN 150 MG PO CAPS: 150.0000 mg | ORAL_CAPSULE | Freq: Two times a day (BID) | ORAL | 3 refills | Status: DC

## 2016-11-01 NOTE — Telephone Encounter (Signed)
Printed! Patient now has insurance and no longer uses [facility]

## 2016-11-04 IMAGING — CR DG ABDOMEN ACUTE W/ 1V CHEST
3 series · 3 of 3 positions shown · non-contrast
Comparison: Radiographs dated 11/15/2015 and 07/29/2015 from pump

CLINICAL DATA: Cyclical vomiting and nausea.

EXAM:
DG ABDOMEN ACUTE W/ 1V CHEST

[x chest ap]
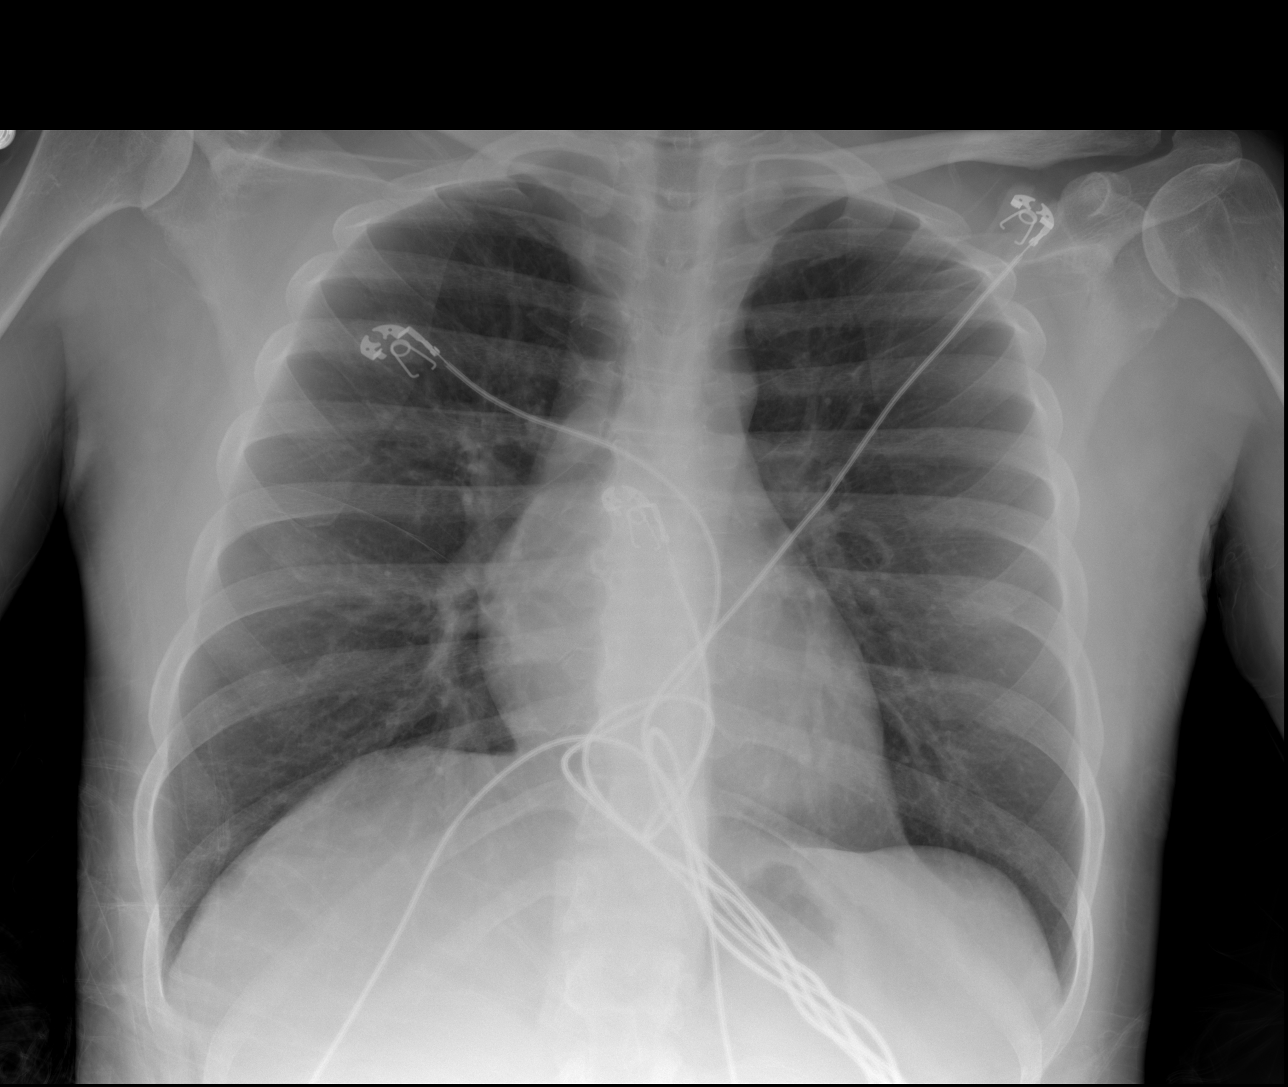

[x abdomen supine]
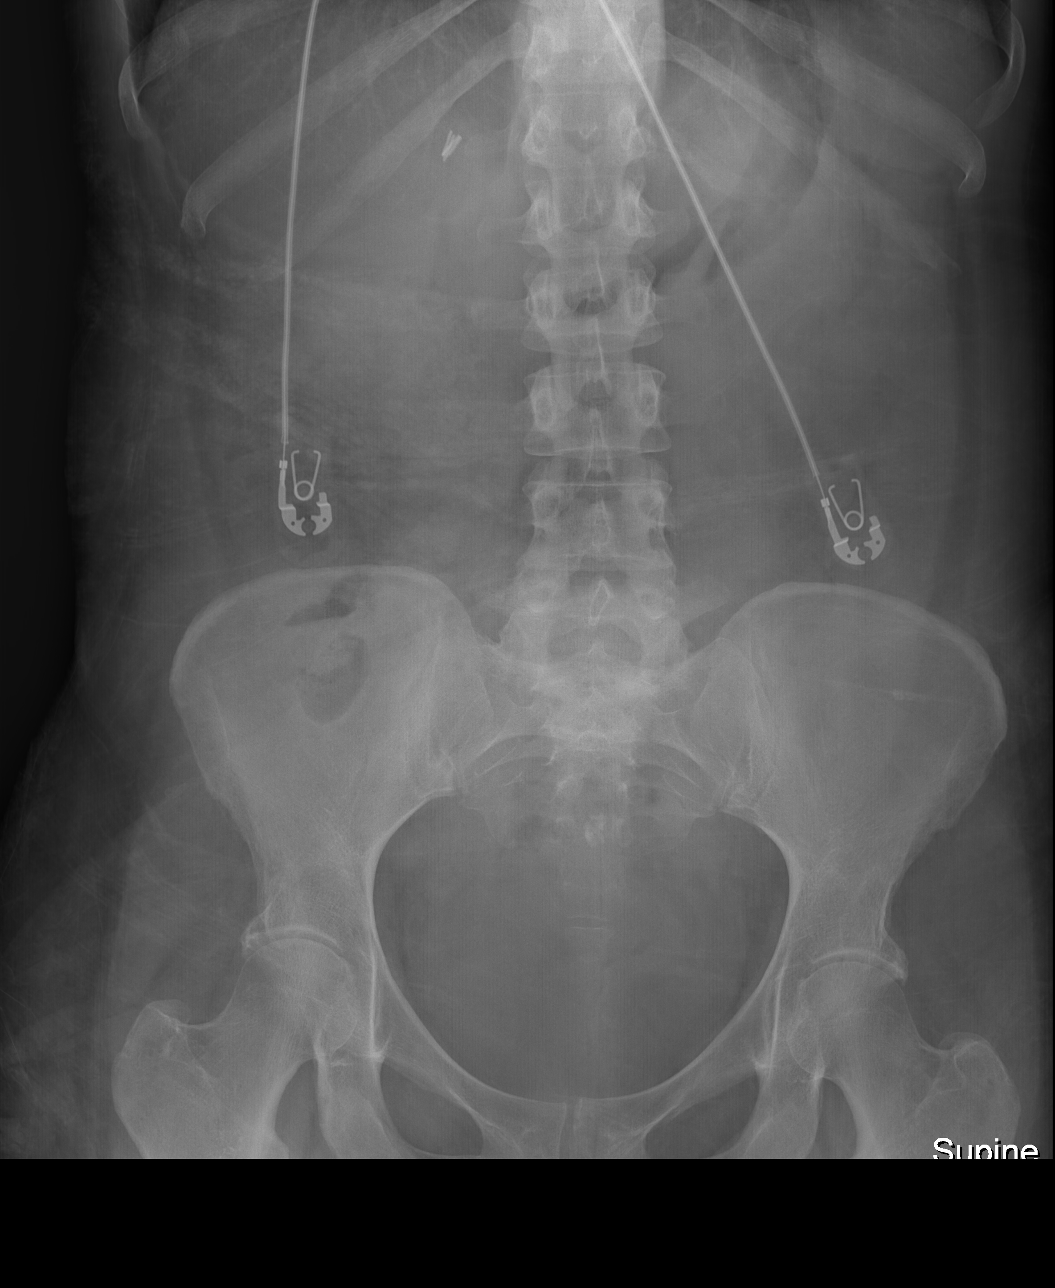

[w abdomen decub]
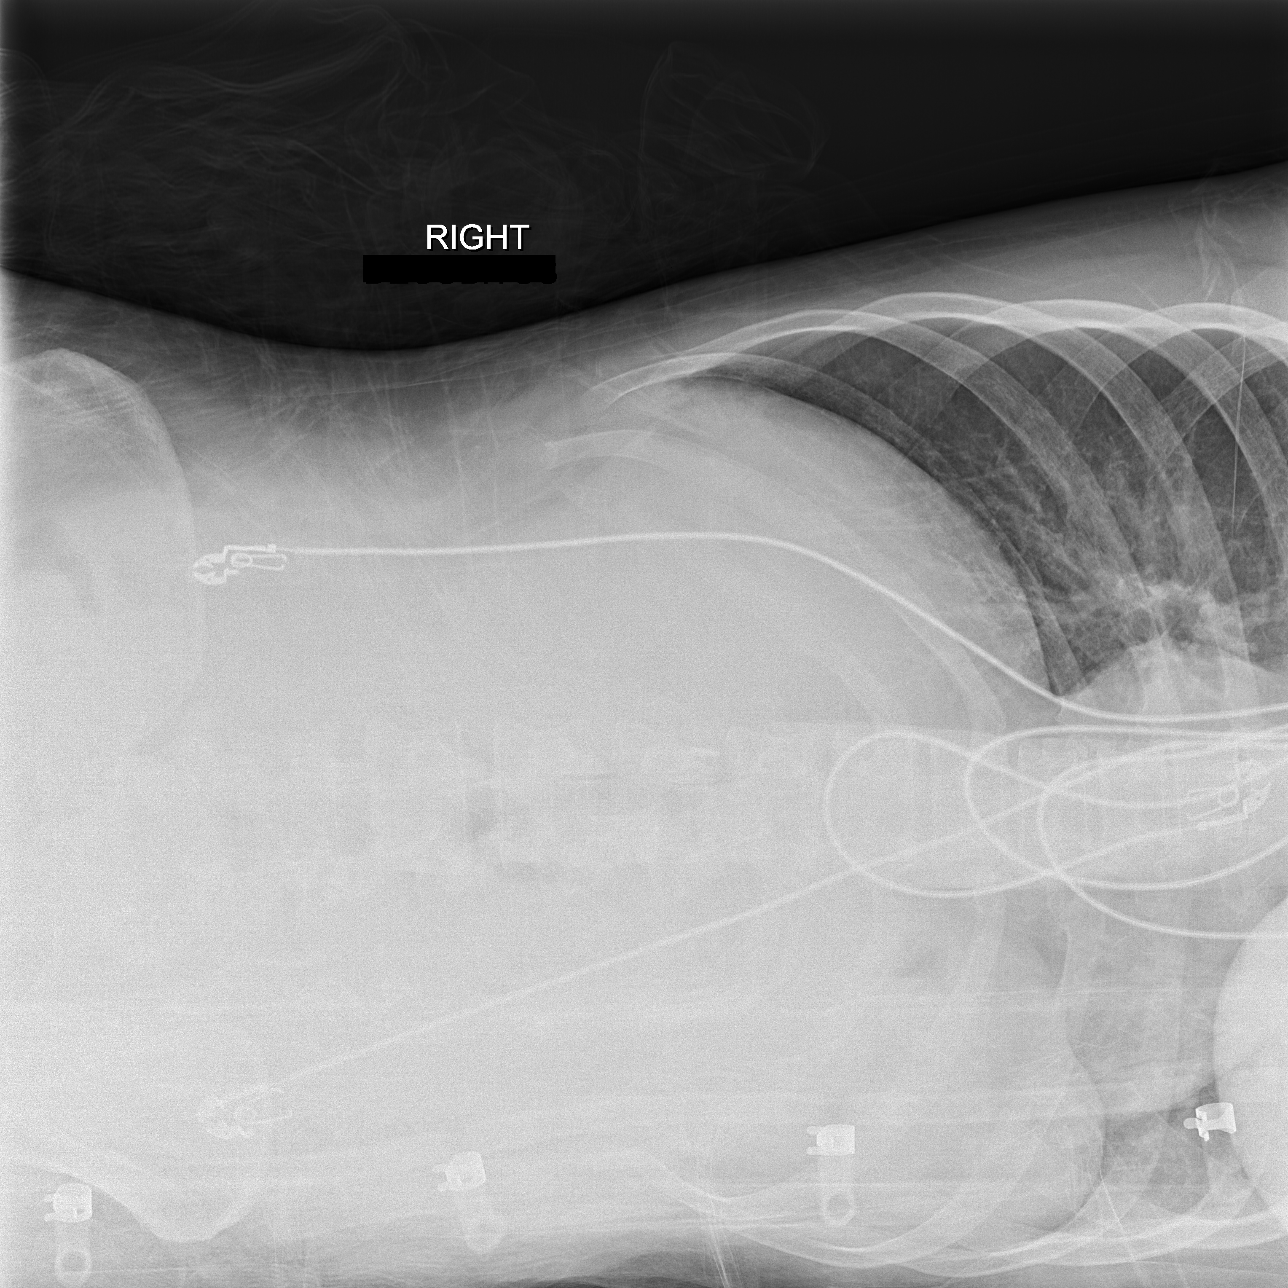

[3 of 3 positions shown; findings below may reference images not displayed]

FINDINGS: There is minimal air in the bowel. No appreciable dilated bowel.
Surgical clips in the right upper quadrant consistent with previous
cholecystectomy. No free air. Bones are normal.
IMPRESSION: Benign-appearing abdomen and chest.

## 2016-11-09 IMAGING — XA IR US GUIDE VASC ACCESS RIGHT
1 series · 1 of 1 positions shown · non-contrast
Comparison: none

INDICATION: Diabetic ketoacidosis, poor venous access. Central venous access
requested for fluids and medications.

[Series 300: ir fluoro guide cv line left · 1 of 1 slices shown]
[im 1/1]
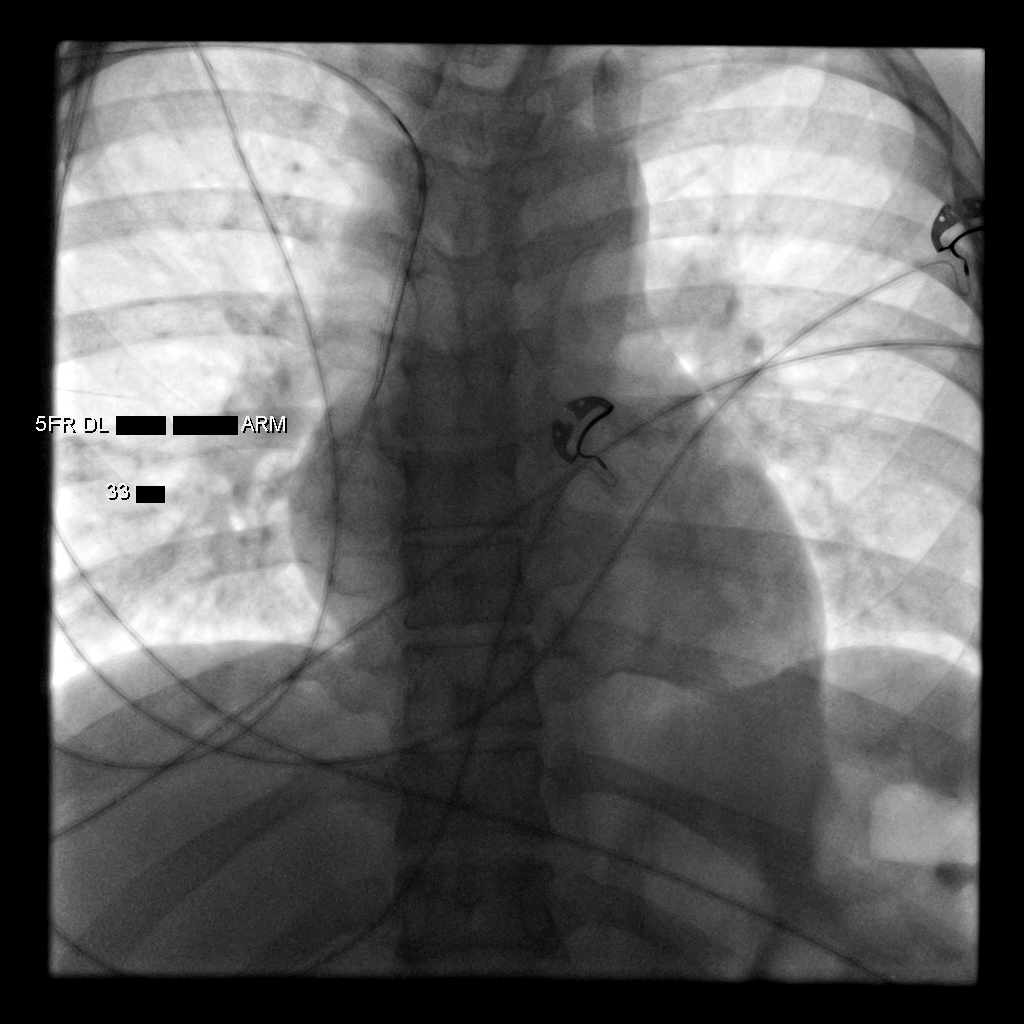

[1 of 1 positions shown; findings below may reference images not displayed]

EXAM:
RIGHT UPPER EXTREMITY PICC LINE PLACEMENT WITH ULTRASOUND AND
FLUOROSCOPIC GUIDANCE

MEDICATIONS:
1% Nesacaine to skin and subcutaneous tissue

ANESTHESIA/SEDATION:
None

FLUOROSCOPY TIME:  Fluoroscopy Time:  12 seconds

COMPLICATIONS:
None immediate.

PROCEDURE:
The patient was advised of the possible risks and complications and
agreed to undergo the procedure. The patient was then brought to the
angiographic suite for the procedure.

The right arm was prepped with chlorhexidine, draped in the usual
sterile fashion using maximum barrier technique (cap and mask,
sterile gown, sterile gloves, large sterile sheet, hand hygiene and
cutaneous antisepsis) and infiltrated locally with 1% Lidocaine.

Ultrasound demonstrated patency of the right brachial vein, and this
was documented with an image. Under real-time ultrasound guidance,
this vein was accessed with a 21 gauge micropuncture needle and
image documentation was performed. A [DATE] wire was introduced in to
the vein. Over this, a 5 French double lumen power injectable PICC
was advanced to the lower SVC/right atrial junction. Fluoroscopy
during the procedure and fluoro spot radiograph confirms appropriate
catheter position. The catheter was flushed and covered with a
sterile dressing.

Catheter length: 33 cm
IMPRESSION: Successful right arm Power PICC line placement with ultrasound and
fluoroscopic guidance. The catheter is ready for use.

## 2016-11-14 ENCOUNTER — Telehealth: Payer: Self-pay | Admitting: Family Medicine

## 2016-11-14 NOTE — Telephone Encounter (Signed)
Patient called the office asking to speak with PCP in regards to the referral that was placed for [facility]. Unfortunately they informed patient that they won't be able to see her because she has a balance with them from previous year. Pt cannot afford to pay balance. Pt would like to be referred else where. Please follow up.  Thank you.

## 2016-11-15 NOTE — Telephone Encounter (Signed)
I will refer her to [facility] . Thank you

## 2016-11-15 NOTE — Telephone Encounter (Signed)
[facility] called stating that pt. Can not be seen b/c she has a balance with them. Pt. Needs to be referred to another endocrinologist.

## 2016-11-15 NOTE — Telephone Encounter (Signed)
I spoke to patient and she don't want to go to [place] she is going to wait until August  When [facility] gi is oncall

## 2016-11-21 ENCOUNTER — Ambulatory Visit (INDEPENDENT_AMBULATORY_CARE_PROVIDER_SITE_OTHER): Payer: Medicaid Other | Admitting: Podiatry

## 2016-11-21 VITALS — BP 175/93 | HR 89

## 2016-11-21 DIAGNOSIS — E1042 Type 1 diabetes mellitus with diabetic polyneuropathy: Secondary | ICD-10-CM | POA: Diagnosis not present

## 2016-11-21 DIAGNOSIS — M2030 Hallux varus (acquired), unspecified foot: Secondary | ICD-10-CM

## 2016-11-21 DIAGNOSIS — M2580 Other specified joint disorders, unspecified joint: Secondary | ICD-10-CM

## 2016-11-21 NOTE — Progress Notes (Signed)
   Subjective:    Patient ID: [name], female    DOB: 28-Sep-1965, 51 y.o.   MRN: [id number]  HPI this patient presents to the office with a myriad of 5 complaints.  I told this patient that I will need to treat the most serious condition that is causing her pain. She says that she is experiencing pain under the ball of the right foot. She says that her pain is intermittent but occurs after excessive activity and walking on her right foot. She denies any history of trauma or injury to her right foot. She says she is scheduled to go on vacation and desires to have the foot evaluated and her pain relieved prior to her vacation.  Patient is diabetic with neuropathy.    Review of Systems  Constitutional: Positive for chills, fatigue and unexpected weight change.  Respiratory: Positive for shortness of breath.   Gastrointestinal: Positive for abdominal pain, constipation, nausea and vomiting.  Endocrine: Positive for cold intolerance.  Neurological: Positive for numbness.  Hematological: Bruises/bleeds easily.       Slow to heal       Objective:   Physical Exam GENERAL APPEARANCE: Alert, conversant. Appropriately groomed. No acute distress.  VASCULAR: Pedal pulses are  palpable at  [facility] and PT bilateral.  Capillary refill time is immediate to all digits,  Normal temperature gradient.   NEUROLOGIC: sensation is normal to 5.07 monofilament at 5/5 sites bilateral.  Light touch is intact bilateral, Muscle strength normal.  MUSCULOSKELETAL: acceptable muscle strength, tone and stability bilateral.  Intrinsic muscluature intact bilateral.  Hallux malleus IPJ  B/L.  Palpable pain fibular sesamoid right foot.  Pain along the course of the FHL tendon right foot.  No swelling noted.  DERMATOLOGIC: skin color, texture, and turgor are within normal limits.  No preulcerative lesions or ulcers  are seen, no interdigital maceration noted.  No open lesions present.  Digital nails are asymptomatic. No drainage  noted.        Assessment & Plan:  Sesamoiditis right foot secondary to hallux malleus right foot.    IE  Padding applied to her sockliner in her shoe.  Prescribe Mobic with instructions to take one daily.   RTC 3 weeks.   Gardiner Barefoot DPM

## 2016-11-22 ENCOUNTER — Telehealth: Payer: Self-pay | Admitting: Podiatry

## 2016-11-22 MED ORDER — MELOXICAM 15 MG PO TABS: 15.0000 mg | ORAL_TABLET | Freq: Every day | ORAL | 0 refills | Status: DC

## 2016-11-22 NOTE — Telephone Encounter (Signed)
I informed pt the Meloxicam had been sent to the [facility].

## 2016-11-22 NOTE — Telephone Encounter (Signed)
Patient stated she saw Dr. Prudence Davidson yesterday and an [facility] was [facility] to [facility] [address]. Nothing was e-scribed. [facility] [address].

## 2016-11-22 NOTE — Addendum Note (Signed)
Addended by: Harriett Sine D on: 11/22/2016 02:40 PM   Modules accepted: Orders

## 2016-12-12 ENCOUNTER — Ambulatory Visit: Payer: Medicaid Other | Admitting: Podiatry

## 2016-12-31 ENCOUNTER — Encounter (HOSPITAL_COMMUNITY): Payer: Self-pay

## 2016-12-31 ENCOUNTER — Emergency Department (HOSPITAL_COMMUNITY)
Admission: EM | Admit: 2016-12-31 | Discharge: 2016-12-31 | Disposition: A | Discharge status: Home / Self Care | Payer: Medicaid Other | Attending: Emergency Medicine | Admitting: Emergency Medicine

## 2016-12-31 DIAGNOSIS — Z79899 Other long term (current) drug therapy: Secondary | ICD-10-CM | POA: Insufficient documentation

## 2016-12-31 DIAGNOSIS — K3184 Gastroparesis: Secondary | ICD-10-CM | POA: Insufficient documentation

## 2016-12-31 DIAGNOSIS — F419 Anxiety disorder, unspecified: Secondary | ICD-10-CM

## 2016-12-31 DIAGNOSIS — E1040 Type 1 diabetes mellitus with diabetic neuropathy, unspecified: Secondary | ICD-10-CM | POA: Diagnosis not present

## 2016-12-31 DIAGNOSIS — E1010 Type 1 diabetes mellitus with ketoacidosis without coma: Secondary | ICD-10-CM | POA: Insufficient documentation

## 2016-12-31 DIAGNOSIS — R112 Nausea with vomiting, unspecified: Secondary | ICD-10-CM | POA: Diagnosis present

## 2016-12-31 DIAGNOSIS — Z791 Long term (current) use of non-steroidal anti-inflammatories (NSAID): Secondary | ICD-10-CM | POA: Insufficient documentation

## 2016-12-31 DIAGNOSIS — I10 Essential (primary) hypertension: Secondary | ICD-10-CM | POA: Diagnosis not present

## 2016-12-31 DIAGNOSIS — Z7984 Long term (current) use of oral hypoglycemic drugs: Secondary | ICD-10-CM | POA: Diagnosis not present

## 2016-12-31 MED ORDER — DIPHENHYDRAMINE HCL 50 MG/ML IJ SOLN: 25.0000 mg | Freq: Once | INTRAMUSCULAR | Status: DC

## 2016-12-31 MED ORDER — SODIUM CHLORIDE 0.9 % IV BOLUS (SEPSIS): 1000.0000 mL | Freq: Once | INTRAVENOUS | Status: DC

## 2016-12-31 MED ORDER — LORAZEPAM 2 MG/ML IJ SOLN
1.0000 mg | Freq: Once | INTRAMUSCULAR | Status: AC
  Administered 2016-12-31: 1 mg via INTRAMUSCULAR
  Filled 2016-12-31: qty 1

## 2016-12-31 MED ORDER — SODIUM CHLORIDE 0.9 % IV SOLN: 1000.0000 mL | INTRAVENOUS | Status: DC

## 2016-12-31 MED ORDER — HALOPERIDOL LACTATE 5 MG/ML IJ SOLN
2.0000 mg | Freq: Once | INTRAMUSCULAR | Status: DC
  Filled 2016-12-31: qty 1

## 2016-12-31 MED ORDER — LORAZEPAM 2 MG/ML IJ SOLN: 1.0000 mg | Freq: Once | INTRAMUSCULAR | Status: DC

## 2016-12-31 MED ORDER — METOCLOPRAMIDE HCL 5 MG/ML IJ SOLN: 10.0000 mg | Freq: Once | INTRAMUSCULAR | Status: DC

## 2016-12-31 MED ORDER — PROMETHAZINE HCL 25 MG/ML IJ SOLN
25.0000 mg | Freq: Once | INTRAMUSCULAR | Status: AC
  Administered 2016-12-31: 25 mg via INTRAMUSCULAR
  Filled 2016-12-31: qty 1

## 2016-12-31 NOTE — ED Notes (Signed)
Patient is alert and oriented x3.  She was given DC instructions and follow up visit instructions.  Patient gave verbal understanding. She was DC ambulatory under her own power to home.  V/S stable.  He was not showing any signs of distress on DC 
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
## 2016-12-31 NOTE — ED Notes (Signed)
Patient feeling better.  PA Notified.  Patient decided that she would like to go home.  Patient given food tray and prepped for DC.

## 2016-12-31 NOTE — ED Triage Notes (Addendum)
Per EMS, pt with abd pain, n/v.  Pt has hx of gastroparesis.  Vitals: 146/82, hr 104, cbg 142, resp 18, 4mg  zofran ODT

## 2016-12-31 NOTE — ED Notes (Signed)
Was not able to get labs. 

## 2016-12-31 NOTE — ED Notes (Signed)
Pt is aware that a urine sample is needed but is unable to provide one at this time 

## 2016-12-31 NOTE — Discharge Instructions (Signed)
Continuing taking your at-home medicines as prescribed. Follow-up with your primary care doctor if you're still feeling anxious. Return to the emergency room if you develop fever, chills, or any new or worsening symptoms.

## 2017-01-06 IMAGING — DX DG CHEST 1V PORT
1 series · 1 of 1 positions shown · non-contrast
Comparison: Chest radiograph from 01/19/2016

CLINICAL DATA: Central line placement.  Initial encounter.

EXAM:
PORTABLE CHEST 1 VIEW

[chest ap]
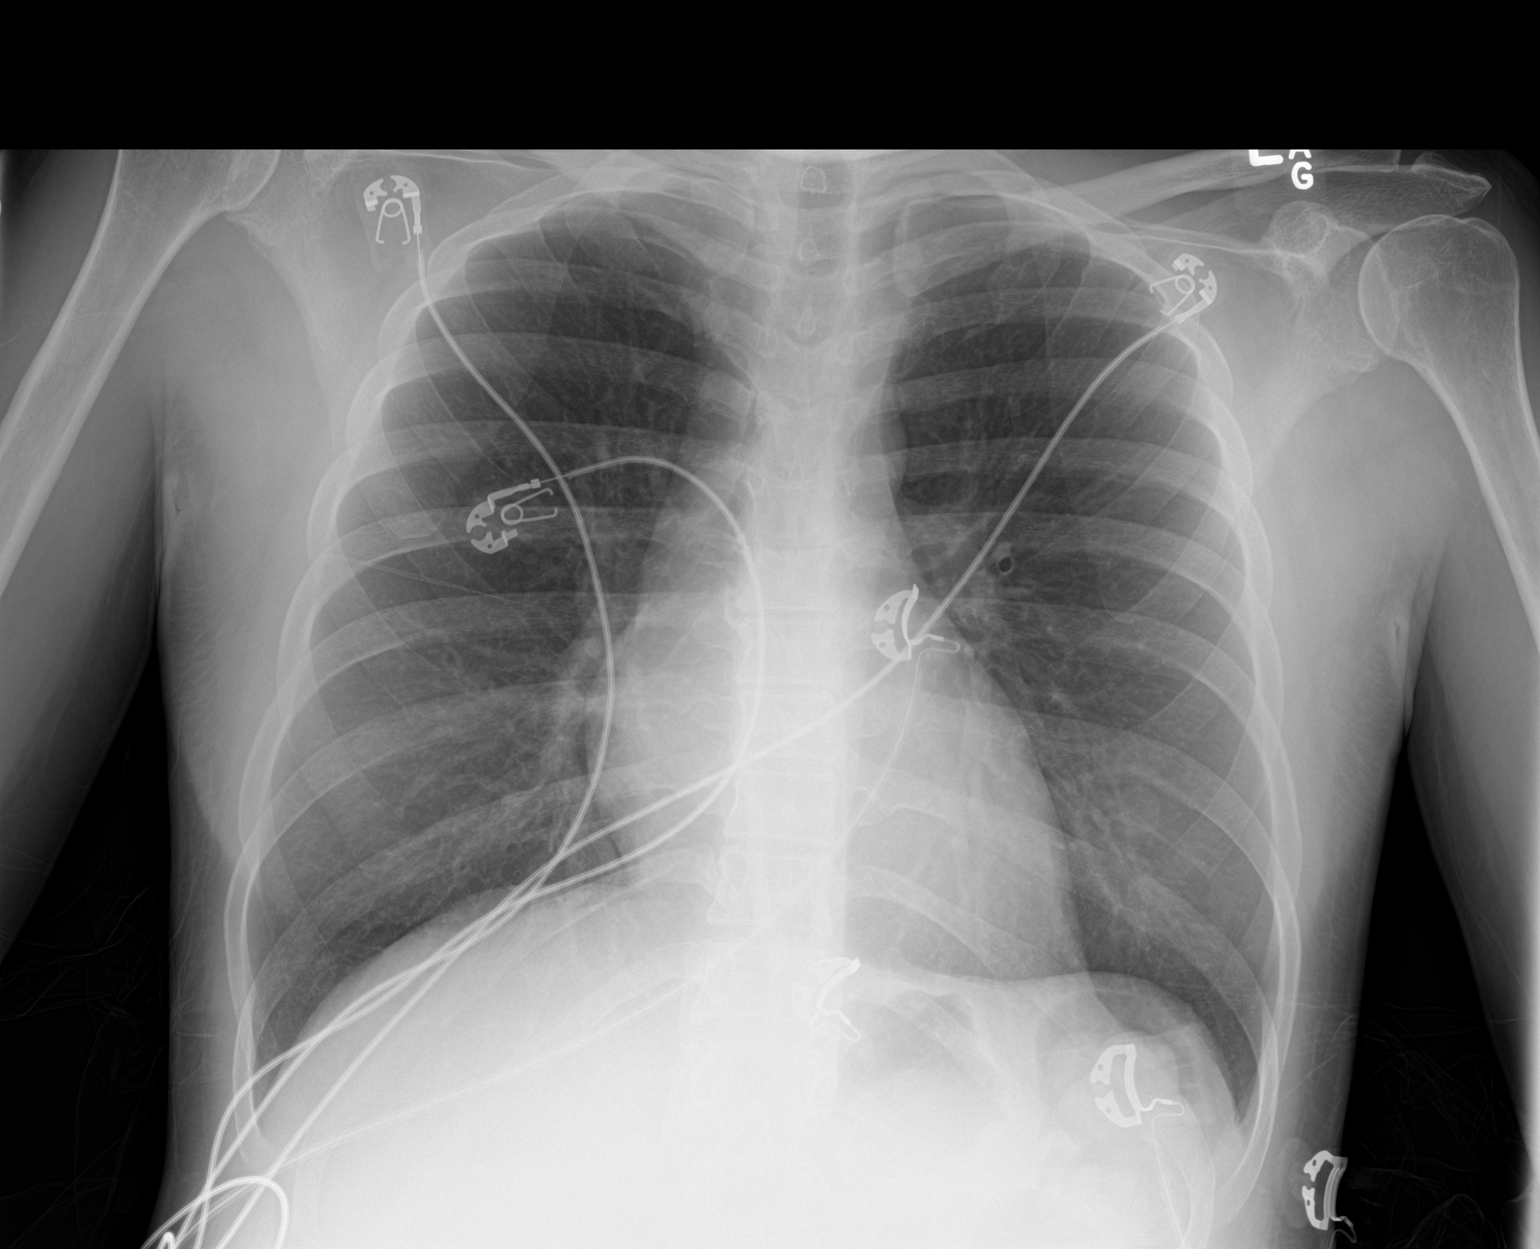

[1 of 1 positions shown; findings below may reference images not displayed]

FINDINGS: No central line is seen.

The lungs are well-aerated and clear. There is no evidence of focal
opacification, pleural effusion or pneumothorax. Bilateral nipple
shadows are noted.

The cardiomediastinal silhouette is within normal limits. No acute
osseous abnormalities are seen.
IMPRESSION: No central line seen.  No acute cardiopulmonary process identified.

## 2017-01-09 ENCOUNTER — Encounter (HOSPITAL_COMMUNITY): Payer: Self-pay | Admitting: Emergency Medicine

## 2017-01-09 ENCOUNTER — Emergency Department (HOSPITAL_COMMUNITY)
Admission: EM | Admit: 2017-01-09 | Discharge: 2017-01-09 | Disposition: A | Discharge status: Home / Self Care | Payer: Medicaid Other | Attending: Emergency Medicine | Admitting: Emergency Medicine

## 2017-01-09 DIAGNOSIS — E1010 Type 1 diabetes mellitus with ketoacidosis without coma: Secondary | ICD-10-CM | POA: Diagnosis not present

## 2017-01-09 DIAGNOSIS — E1040 Type 1 diabetes mellitus with diabetic neuropathy, unspecified: Secondary | ICD-10-CM | POA: Insufficient documentation

## 2017-01-09 DIAGNOSIS — F329 Major depressive disorder, single episode, unspecified: Secondary | ICD-10-CM | POA: Diagnosis not present

## 2017-01-09 DIAGNOSIS — K3184 Gastroparesis: Secondary | ICD-10-CM | POA: Insufficient documentation

## 2017-01-09 DIAGNOSIS — Z79899 Other long term (current) drug therapy: Secondary | ICD-10-CM | POA: Diagnosis not present

## 2017-01-09 DIAGNOSIS — I10 Essential (primary) hypertension: Secondary | ICD-10-CM | POA: Diagnosis not present

## 2017-01-09 DIAGNOSIS — F410 Panic disorder [episodic paroxysmal anxiety] without agoraphobia: Secondary | ICD-10-CM | POA: Insufficient documentation

## 2017-01-09 DIAGNOSIS — R45851 Suicidal ideations: Secondary | ICD-10-CM | POA: Diagnosis not present

## 2017-01-09 DIAGNOSIS — Z794 Long term (current) use of insulin: Secondary | ICD-10-CM | POA: Insufficient documentation

## 2017-01-09 LAB — COMPREHENSIVE METABOLIC PANEL
ALT: 13 U/L — ABNORMAL LOW (ref 14–54)
ANION GAP: 12 (ref 5–15)
AST: 21 U/L (ref 15–41)
Albumin: 3.9 g/dL (ref 3.5–5.0)
Alkaline Phosphatase: 89 U/L (ref 38–126)
BILIRUBIN TOTAL: 0.7 mg/dL (ref 0.3–1.2)
BUN: 12 mg/dL (ref 6–20)
CO2: 26 mmol/L (ref 22–32)
Calcium: 9.6 mg/dL (ref 8.9–10.3)
Chloride: 99 mmol/L — ABNORMAL LOW (ref 101–111)
Creatinine, Ser: 0.97 mg/dL (ref 0.44–1.00)
Glucose, Bld: 230 mg/dL — ABNORMAL HIGH (ref 65–99)
POTASSIUM: 3.7 mmol/L (ref 3.5–5.1)
Sodium: 137 mmol/L (ref 135–145)
TOTAL PROTEIN: 7.7 g/dL (ref 6.5–8.1)

## 2017-01-09 LAB — URINALYSIS, ROUTINE W REFLEX MICROSCOPIC
BILIRUBIN URINE: NEGATIVE
Bacteria, UA: NONE SEEN
Glucose, UA: >=500 mg/dL — AB
HGB URINE DIPSTICK: NEGATIVE
Ketones, ur: 80 mg/dL — AB
LEUKOCYTES UA: NEGATIVE
NITRITE: NEGATIVE
PH: 6 (ref 5.0–8.0)
Protein, ur: 100 mg/dL — AB
SPECIFIC GRAVITY, URINE: 1.021 (ref 1.005–1.030)

## 2017-01-09 LAB — CBC
HEMATOCRIT: 32.8 % — AB (ref 36.0–46.0)
HEMOGLOBIN: 10.2 g/dL — AB (ref 12.0–15.0)
MCH: 22.5 pg — ABNORMAL LOW (ref 26.0–34.0)
MCHC: 31.1 g/dL (ref 30.0–36.0)
MCV: 72.2 fL — ABNORMAL LOW (ref 78.0–100.0)
Platelets: 432 10*3/uL — ABNORMAL HIGH (ref 150–400)
RBC: 4.54 MIL/uL (ref 3.87–5.11)
RDW: 15.6 % — AB (ref 11.5–15.5)
WBC: 5.3 10*3/uL (ref 4.0–10.5)

## 2017-01-09 LAB — RAPID URINE DRUG SCREEN, HOSP PERFORMED
Amphetamines: NOT DETECTED
BENZODIAZEPINES: NOT DETECTED
Barbiturates: NOT DETECTED
COCAINE: NOT DETECTED
OPIATES: NOT DETECTED
Tetrahydrocannabinol: NOT DETECTED

## 2017-01-09 LAB — I-STAT BETA HCG BLOOD, ED (MC, WL, AP ONLY): I-stat hCG, quantitative: <5 m[IU]/mL (ref <5)

## 2017-01-09 LAB — LIPASE, BLOOD: Lipase: 19 U/L (ref 11–51)

## 2017-01-09 LAB — SALICYLATE LEVEL: Salicylate Lvl: <7 mg/dL (ref 2.8–30.0)

## 2017-01-09 LAB — ETHANOL: Alcohol, Ethyl (B): <5 mg/dL (ref <5)

## 2017-01-09 LAB — ACETAMINOPHEN LEVEL

## 2017-01-09 MED ORDER — SODIUM CHLORIDE 0.9 % IV BOLUS (SEPSIS)
1000.0000 mL | Freq: Once | INTRAVENOUS | Status: AC
  Administered 2017-01-09: 1000 mL via INTRAVENOUS

## 2017-01-09 MED ORDER — PROMETHAZINE HCL 25 MG PO TABS: 25.0000 mg | ORAL_TABLET | Freq: Four times a day (QID) | ORAL | 0 refills | Status: DC | PRN

## 2017-01-09 MED ORDER — LORAZEPAM 1 MG PO TABS: 1.0000 mg | ORAL_TABLET | Freq: Three times a day (TID) | ORAL | 0 refills | Status: DC | PRN

## 2017-01-09 MED ORDER — LORAZEPAM 2 MG/ML IJ SOLN
1.0000 mg | Freq: Once | INTRAMUSCULAR | Status: AC
  Administered 2017-01-09: 1 mg via INTRAVENOUS
  Filled 2017-01-09: qty 1

## 2017-01-09 MED ORDER — METOCLOPRAMIDE HCL 10 MG PO TABS
10.0000 mg | ORAL_TABLET | Freq: Once | ORAL | Status: AC
  Administered 2017-01-09: 10 mg via ORAL
  Filled 2017-01-09: qty 1

## 2017-01-09 NOTE — Discharge Instructions (Signed)
Please see the [facility] team as soon as possible - you can walk in to the clinic anytime during the day. Return to the ER if your suicidal thoughts get worse.  For gastroparesis - please take the meds prescribed. Clear liquid for now.

## 2017-01-09 NOTE — ED Notes (Signed)
Pt educated to go straight to [facility]. Pt educated to return to ED for suicidal thoughts. Discussed discharge with Dr. Kathrynn Humble. Kathrynn Humble, MD, reports plan to proceed with discharge.

## 2017-01-09 NOTE — ED Triage Notes (Addendum)
Pt c/o poorly localized aching periumbilical abdominal pain, emesis onset today at 0600. No blood in stool or emesis. Pt reports symptoms feels similar to her usual gastroparesis. Pt reports SI without plan. No recent attempts.

## 2017-01-09 NOTE — ED Triage Notes (Signed)
Per [facility] pt from home, woke up this AM with [facility]. Per sister pt is suicidal, pt confirmed with EMS SI . Also report upper abd pain. Pt actively opining in triage. Received 4 mg Zofran ODT by EMS prior to arrival. Denies chest pain nor shortness of breath. Alert and oriented x 4.

## 2017-01-09 NOTE — ED Provider Notes (Signed)
[place] DEPT Provider Note   CSN: [id number] Arrival date & time: 01/09/17  4098     History   Chief Complaint Chief Complaint  Patient presents with  . Emesis  . Abdominal Pain  . Suicidal    HPI [name] is a 51 y.o. female.  HPI Pt with hx of DM, Gastroparesis, GERD comes in with cc of abd pain, nausea, emesis. Pt reports that she woke up this morning with abd pain and nausea. She has had emesis x 4 times today. No blood or bile. Pt's abd pain is in the upper quadrants and similar to her gastroparesis pain. Pt has increased urinary frequency x 1 day.   Pt also reports feeling depressed because she feels sick and she thinks son doesn't want to be around.  Past Medical History:  Diagnosis Date  . Depression   . Diabetes mellitus without complication ([place])   . Essential hypertension   . Gastroparesis   . GERD (gastroesophageal reflux disease)   . HLD (hyperlipidemia)     Patient Active Problem List   Diagnosis Date Noted  . Gastroparesis 04/20/2016  . Type 1 diabetes mellitus with ketoacidosis without coma ([place])   . DKA (diabetic ketoacidoses) ([place]) 03/18/2016  . Noncompliance with treatment plan 03/13/2016  . Essential hypertension 01/24/2016  . Leukocytosis 01/24/2016  . Abnormal ECG 11/27/2015  . HLD (hyperlipidemia)   . GERD (gastroesophageal reflux disease)   . Metabolic acidosis, normal anion gap (NAG)   . AKI (acute kidney injury) ([place])   . Anemia, iron deficiency   . DKA, type 1, not at goal [facility])   . Hyponatremia 09/02/2015  . Vitamin B12 deficiency 08/16/2015  . Lactic acidosis 07/29/2015  . Acute pyelonephritis 07/29/2015  . Pyelonephritis   . Hypothermia   . Diabetic gastroparesis ([place])   . Diabetic neuropathy, type I diabetes mellitus ([place]) 05/18/2015  . Anemia 05/18/2015  . Anxiety and depression 05/18/2015  . Nausea & vomiting   . Diabetic ketoacidosis without coma associated with type 1 diabetes mellitus ([place])   . High anion  gap metabolic acidosis 11/91/4782    Past Surgical History:  Procedure Laterality Date  . COLONOSCOPY  09/27/2014   at [facility]  . ESOPHAGOGASTRODUODENOSCOPY  09/27/2014   at [facility], Dr Rolan Lipa. biospy neg for celiac, neg for H pylori.   . EYE SURGERY    . gailstones    . IR GENERIC HISTORICAL  01/24/2016   IR FLUORO GUIDE CV LINE RIGHT 01/24/2016 Darrell K Allred, PA-C [facility]-INTERV RAD  . IR GENERIC HISTORICAL  01/24/2016   IR US GUIDE VASC ACCESS RIGHT 01/24/2016 Darrell K Allred, PA-C [facility]  . POSTERIOR VITRECTOMY AND MEMBRANE PEEL-LEFT EYE  09/28/2002  . POSTERIOR VITRECTOMY AND MEMBRANE PEEL-RIGHT EYE  03/16/2002  . RETINAL DETACHMENT SURGERY      OB History    No data available       Home Medications    Prior to Admission medications   Medication Sig Start Date End Date Taking? Authorizing Provider  albuterol (PROVENTIL HFA;VENTOLIN HFA) 108 (90 Base) MCG/ACT inhaler Inhale 2 puffs into the lungs every 6 (six) hours as needed for wheezing or shortness of breath. 10/20/15  Yes Amao, [name], MD  atorvastatin (LIPITOR) 40 MG tablet Take 1 tablet (40 mg total) by mouth daily. 10/30/16  Yes [name], MD  busPIRone (BUSPAR) 10 MG tablet Take 1 tablet (10 mg total) by mouth 2 (two) times daily. 10/30/16  Yes [name], MD  famotidine (  PEPCID) 20 MG tablet Take 1 tablet (20 mg total) by mouth 2 (two) times daily. 09/29/16  Yes Patrecia Pour, Christean Grief, MD  FLUoxetine (PROZAC) 20 MG tablet Take 2 tablets (40 mg total) by mouth daily. 10/30/16  Yes [name], MD  furosemide (LASIX) 40 MG tablet Take 40 mg by mouth daily as needed for fluid.    Yes [provider]  insulin aspart (NOVOLOG) 100 UNIT/ML injection Inject 0-12 Units into the skin 3 (three) times daily with meals. Per sliding scale 10/30/16  Yes Amao, [name], MD  insulin detemir (LEVEMIR) 100 UNIT/ML injection Inject 0.2 mLs (20 Units total) into the skin 2 (two) times daily. 10/30/16  Yes [name], MD    metoCLOPramide (REGLAN) 10 MG tablet Take 1 tablet (10 mg total) by mouth 3 (three) times daily before meals. 10/30/16  Yes [name], MD  ondansetron (ZOFRAN ODT) 4 MG disintegrating tablet Take 1 tablet (4 mg total) by mouth every 8 (eight) hours as needed for nausea or vomiting. 09/04/16  Yes [name], MD  polyethylene glycol (MIRALAX / GLYCOLAX) packet Take 17 g by mouth daily as needed for mild constipation. 09/04/16  Yes [name], MD  pregabalin (LYRICA) 150 MG capsule Take 1 capsule (150 mg total) by mouth 2 (two) times daily. 11/01/16  Yes [name], MD  Blood Glucose Monitoring Suppl (ACCU-CHEK AVIVA) device Use as instructed 3 times daily before meals 10/30/16   [name], MD  ferrous sulfate 325 (65 FE) MG tablet Take 1 tablet (325 mg total) by mouth daily with breakfast. Patient not taking: Reported on 09/04/2016 03/27/16   Donne Hazel, MD  glucose blood (ACCU-CHEK AVIVA) test strip Use as instructed 3 times daily before meals 10/30/16   [name], MD  Insulin Syringe-Needle U-100 (BD INSULIN SYRINGE ULTRAFINE) 31G X 15/64" 0.5 ML MISC 1 each by Does not apply route 4 (four) times daily as needed. 08/16/15   [name], MD  Lancet Devices [facility]) lancets Use as instructed 3 times daily before meals 10/30/16   [name], MD  LORazepam (ATIVAN) 1 MG tablet Take 1 tablet (1 mg total) by mouth every 8 (eight) hours as needed for anxiety. 01/09/17   Varney Biles, MD  meloxicam (MOBIC) 15 MG tablet Take 1 tablet (15 mg total) by mouth daily. Patient not taking: Reported on 01/09/2017 11/22/16   Gardiner Barefoot, DPM  norethindrone (ORTHO MICRONOR) 0.35 MG tablet Take 1 tablet (0.35 mg total) by mouth daily. Patient not taking: Reported on 01/09/2017 10/30/16   [name], MD  promethazine (PHENERGAN) 25 MG tablet Take 1 tablet (25 mg total) by mouth every 6 (six) hours as needed for nausea. 01/09/17   Varney Biles, MD    Family History Family History   Problem Relation Age of Onset  . Cystic fibrosis Mother   . Hypertension Father   . Diabetes Brother   . Hypertension Maternal Grandmother     Social History Social History  Substance Use Topics  . Smoking status: Never Smoker  . Smokeless tobacco: Never Used  . Alcohol use No     Allergies   Anesthetics, amide; Penicillins; Buprenorphine hcl; and Encainide   Review of Systems Review of Systems  Constitutional: Positive for activity change.  Gastrointestinal: Positive for abdominal pain, nausea and vomiting.  Genitourinary: Positive for frequency.  All other systems reviewed and are negative.    Physical Exam Updated Vital Signs BP (!) 148/85   Pulse 86   Temp  98.2 F (36.8 C) (Oral)   Resp (!) 22   LMP 12/30/2016   SpO2 100%   Physical Exam  Constitutional: She is oriented to person, place, and time. She appears well-developed and well-nourished.  HENT:  Head: Normocephalic and atraumatic.  Eyes: Pupils are equal, round, and reactive to light. EOM are normal.  Neck: Neck supple.  Cardiovascular: Normal rate, regular rhythm and normal heart sounds.   No murmur heard. Pulmonary/Chest: Effort normal. No respiratory distress.  Abdominal: Soft. She exhibits no distension. There is tenderness. There is no rebound and no guarding.  Neurological: She is alert and oriented to person, place, and time.  Skin: Skin is warm and dry.  Nursing note and vitals reviewed.    ED Treatments / Results  Labs (all labs ordered are listed, but only abnormal results are displayed) Labs Reviewed  COMPREHENSIVE METABOLIC PANEL - Abnormal; Notable for the following:       Result Value   Chloride 99 (*)    Glucose, Bld 230 (*)    ALT 13 (*)    All other components within normal limits  CBC - Abnormal; Notable for the following:    Hemoglobin 10.2 (*)    HCT 32.8 (*)    MCV 72.2 (*)    MCH 22.5 (*)    RDW 15.6 (*)    Platelets 432 (*)    All other components within normal  limits  URINALYSIS, ROUTINE W REFLEX MICROSCOPIC - Abnormal; Notable for the following:    Glucose, UA >=500 (*)    Ketones, ur 80 (*)    Protein, ur 100 (*)    Squamous Epithelial / LPF 0-5 (*)    All other components within normal limits  ACETAMINOPHEN LEVEL - Abnormal; Notable for the following:    Acetaminophen (Tylenol), Serum <10 (*)    All other components within normal limits  LIPASE, BLOOD  ETHANOL  SALICYLATE LEVEL  RAPID URINE DRUG SCREEN, HOSP PERFORMED  I-STAT BETA HCG BLOOD, ED ([facility], [facility], [facility] ONLY)    EKG  EKG Interpretation  Date/Time:  Wednesday January 09 2017 11:21:07 EDT Ventricular Rate:  83 PR Interval:    QRS Duration: 94 QT Interval:  371 QTC Calculation: 436 R Axis:   -10 Text Interpretation:  Sinus rhythm No acute changes No significant change since last tracing Confirmed by Varney Biles ([id number]) on 01/09/2017 11:32:04 AM       Radiology No results found.  Procedures Procedures (including critical care time)  Medications Ordered in ED Medications  LORazepam (ATIVAN) injection 1 mg (1 mg Intravenous Given 01/09/17 1123)  sodium chloride 0.9 % bolus 1,000 mL (0 mLs Intravenous Stopped 01/09/17 1315)  metoCLOPramide (REGLAN) tablet 10 mg (10 mg Oral Given 01/09/17 1341)     Initial Impression / Assessment and Plan / ED Course  I have reviewed the triage vital signs and the nursing notes.  Pertinent labs & imaging results that were available during my care of the patient were reviewed by me and considered in my medical decision making (see chart for details).  Clinical Course as of Jan 09 1438  Wed Jan 09, 2017  1439 Pt reassessed. Pt's VSS and WNL. Pt's cap refill < 3 seconds. Pt has been hydrated in the ER and now passed po challenge. We will discharge with antiemetic. Strict ER return precautions have been discussed and pt will return if he is unable to tolerate fluids and symptoms are getting worse.   Results from the ER workup  discussed with  the patient face to face and all questions answered to the best of my ability.  Strict ER return precautions have been discussed, and patient is agreeing with the plan and is comfortable with the workup done and the recommendations from the ER.    [AN]  1439 Pt reiterated that she needs to [facility] soon and to return to the ER if she has worsening of her SI, and pt agrees.  [AN]    Clinical Course User Index [AN] Varney Biles, MD    Pt comes in with cc of nausea and emesis. Pt has hx of gastroparesis, and her symptoms are similar to her gastroparesis. We have ordered basic labs. We will r/o DKA. Gastroparesis appears mild.  Pt also has suicidal thoughts, without any serious plan or previous attempt. Pt has no substance abuse hx. She reports that when she gets sick (gastroparesis) or when her son doesn't pay attention to her she feels depressed and has suicidal thoughts. The SI is transient. Because of  Lack of serious red flags, I think outpatient management is ok, with strict return precautions and pt is fine with that as well.  Finally, pt has mild anxiety attack - predisposed due to gastroparesis. Ativan given.    Final Clinical Impressions(s) / ED Diagnoses   Final diagnoses:  Suicidal ideations  Gastroparesis  Anxiety attack    New Prescriptions New Prescriptions   LORAZEPAM (ATIVAN) 1 MG TABLET    Take 1 tablet (1 mg total) by mouth every 8 (eight) hours as needed for anxiety.   PROMETHAZINE (PHENERGAN) 25 MG TABLET    Take 1 tablet (25 mg total) by mouth every 6 (six) hours as needed for nausea.     Varney Biles, MD 01/09/17 1440

## 2017-01-14 MED FILL — NovoLOG 100 UNIT/ML SOLN: 100 | 30 days supply | Qty: 10 | Fill #1

## 2017-01-14 MED FILL — LEVEMIR 100 UNITS/ML VIAL: 100 | 25 days supply | Qty: 10 | Fill #1

## 2017-01-14 MED FILL — busPIRone HCL 10 MG TABS: 10 | 30 days supply | Qty: 60 | Fill #1

## 2017-01-17 ENCOUNTER — Ambulatory Visit: Payer: Medicaid Other | Admitting: Licensed Clinical Social Worker

## 2017-01-17 ENCOUNTER — Ambulatory Visit: Payer: Medicaid Other | Attending: Family Medicine | Admitting: Physician Assistant

## 2017-01-17 VITALS — BP 120/75 | HR 101 | Temp 98.2°F | Resp 18 | Ht 62.0 in | Wt 137.8 lb

## 2017-01-17 DIAGNOSIS — E1043 Type 1 diabetes mellitus with diabetic autonomic (poly)neuropathy: Secondary | ICD-10-CM | POA: Diagnosis not present

## 2017-01-17 DIAGNOSIS — K219 Gastro-esophageal reflux disease without esophagitis: Secondary | ICD-10-CM | POA: Insufficient documentation

## 2017-01-17 DIAGNOSIS — E109 Type 1 diabetes mellitus without complications: Secondary | ICD-10-CM | POA: Diagnosis present

## 2017-01-17 DIAGNOSIS — Z79899 Other long term (current) drug therapy: Secondary | ICD-10-CM | POA: Diagnosis not present

## 2017-01-17 DIAGNOSIS — R300 Dysuria: Secondary | ICD-10-CM

## 2017-01-17 DIAGNOSIS — B379 Candidiasis, unspecified: Secondary | ICD-10-CM

## 2017-01-17 DIAGNOSIS — Z87440 Personal history of urinary (tract) infections: Secondary | ICD-10-CM | POA: Diagnosis not present

## 2017-01-17 DIAGNOSIS — F329 Major depressive disorder, single episode, unspecified: Secondary | ICD-10-CM | POA: Diagnosis not present

## 2017-01-17 DIAGNOSIS — I10 Essential (primary) hypertension: Secondary | ICD-10-CM | POA: Diagnosis not present

## 2017-01-17 DIAGNOSIS — Z888 Allergy status to other drugs, medicaments and biological substances status: Secondary | ICD-10-CM | POA: Insufficient documentation

## 2017-01-17 DIAGNOSIS — E1040 Type 1 diabetes mellitus with diabetic neuropathy, unspecified: Secondary | ICD-10-CM | POA: Diagnosis not present

## 2017-01-17 DIAGNOSIS — Z794 Long term (current) use of insulin: Secondary | ICD-10-CM | POA: Insufficient documentation

## 2017-01-17 DIAGNOSIS — Z880 Allergy status to penicillin: Secondary | ICD-10-CM | POA: Diagnosis not present

## 2017-01-17 DIAGNOSIS — E1010 Type 1 diabetes mellitus with ketoacidosis without coma: Secondary | ICD-10-CM | POA: Diagnosis not present

## 2017-01-17 DIAGNOSIS — F32A Depression, unspecified: Secondary | ICD-10-CM

## 2017-01-17 DIAGNOSIS — E785 Hyperlipidemia, unspecified: Secondary | ICD-10-CM | POA: Insufficient documentation

## 2017-01-17 DIAGNOSIS — K3184 Gastroparesis: Secondary | ICD-10-CM | POA: Insufficient documentation

## 2017-01-17 LAB — POCT URINALYSIS DIPSTICK
BILIRUBIN UA: NEGATIVE
GLUCOSE UA: 500
KETONES UA: NEGATIVE
Leukocytes, UA: NEGATIVE
Nitrite, UA: NEGATIVE
PH UA: 5.5 (ref 5.0–8.0)
Protein, UA: 100
Spec Grav, UA: 1.025 (ref 1.010–1.025)
Urobilinogen, UA: 0.2 E.U./dL

## 2017-01-17 LAB — POCT GLYCOSYLATED HEMOGLOBIN (HGB A1C): HEMOGLOBIN A1C: 10.1

## 2017-01-17 LAB — GLUCOSE, POCT (MANUAL RESULT ENTRY): POC GLUCOSE: 175 mg/dL — AB (ref 70–99)

## 2017-01-17 MED ORDER — PREGABALIN 150 MG PO CAPS: 150.0000 mg | ORAL_CAPSULE | Freq: Two times a day (BID) | ORAL | 3 refills | Status: DC

## 2017-01-17 MED ORDER — INSULIN DETEMIR 100 UNIT/ML ~~LOC~~ SOLN: 25.0000 [IU] | Freq: Two times a day (BID) | SUBCUTANEOUS | 11 refills | Status: DC

## 2017-01-17 MED ORDER — FLUCONAZOLE 150 MG PO TABS: ORAL_TABLET | ORAL | 0 refills | Status: DC

## 2017-01-17 MED ORDER — NITROFURANTOIN MONOHYD MACRO 100 MG PO CAPS: 100.0000 mg | ORAL_CAPSULE | Freq: Two times a day (BID) | ORAL | 0 refills | Status: DC

## 2017-01-17 NOTE — Progress Notes (Signed)
[name], is a 51 y.o. female  UMP:[id number]  QMG:[id number]  DOB - 1965-12-24  Subjective:  Chief Complaint and HPI: [name] is a 51 y.o. female here for diabetes check and because she has been having painful urination for about 3 days that is getting worse.  She also feels as though she is getting a yeast infection. She denies f/c. She denies abdominal pain or vaginal discharge.    She reports blood sugars running from 80-low 300s at home.  She says they are most often in the 200s.  She denies s/sx hyper/hypoglycemia.  She was seen in the ED 01/09/2017 for gastroparesis, anxiety, and depression with suicidal thoughts.  These thoughts have improved.  She does experience depression related to having chronic medical problems.  She tried to go to [facility] last Friday.  She said she was told to be there bt 8-3 on Friday.  She arrived at 1 and she was told they didn't have more slots. She now has medicaid and they will provide transportation.  Transportation has been a limiting factor.    ROS:   Constitutional:  No f/c, No night sweats, No unexplained weight loss. EENT:  No vision changes, No blurry vision, No hearing changes. No mouth, throat, or ear problems.  Respiratory: No cough, No SOB Cardiac: No CP, no palpitations GI:  No abd pain, No N/V/D. GU: +Urinary s/sx Musculoskeletal: No joint pain Neuro: No headache, no dizziness, no motor weakness.  Skin: No rash Endocrine:  No polydipsia. No polyuria.  Psych: Denies SI/HI  No problems updated.  ALLERGIES: Allergies  Allergen Reactions  . Anesthetics, Amide Nausea And Vomiting  . Penicillins Diarrhea and Nausea And Vomiting    Has patient had a PCN reaction causing immediate rash, facial/tongue/throat swelling, SOB or lightheadedness with hypotension: No Has patient had a PCN reaction causing severe rash involving mucus membranes or skin necrosis: No Has patient had a PCN reaction that required hospitalization No Has  patient had a PCN reaction occurring within the last 10 years: Yes  If all of the above answers are "NO", then may proceed with Cephalosporin use.   . Buprenorphine Hcl Rash  . Encainide Nausea And Vomiting    PAST MEDICAL HISTORY: Past Medical History:  Diagnosis Date  . Depression   . Diabetes mellitus without complication ([place])   . Essential hypertension   . Gastroparesis   . GERD (gastroesophageal reflux disease)   . HLD (hyperlipidemia)     MEDICATIONS AT HOME: Prior to Admission medications   Medication Sig Start Date End Date Taking? Authorizing Provider  albuterol (PROVENTIL HFA;VENTOLIN HFA) 108 (90 Base) MCG/ACT inhaler Inhale 2 puffs into the lungs every 6 (six) hours as needed for wheezing or shortness of breath. 10/20/15  Yes Amao, [name], MD  atorvastatin (LIPITOR) 40 MG tablet Take 1 tablet (40 mg total) by mouth daily. 10/30/16  Yes [name], MD  Blood Glucose Monitoring Suppl (ACCU-CHEK AVIVA) device Use as instructed 3 times daily before meals 10/30/16  Yes Amao, Enobong, MD  busPIRone (BUSPAR) 10 MG tablet Take 1 tablet (10 mg total) by mouth 2 (two) times daily. 10/30/16  Yes [name], MD  famotidine (PEPCID) 20 MG tablet Take 1 tablet (20 mg total) by mouth 2 (two) times daily. 09/29/16  Yes Patrecia Pour, Christean Grief, MD  ferrous sulfate 325 (65 FE) MG tablet Take 1 tablet (325 mg total) by mouth daily with breakfast. 03/27/16  Yes Donne Hazel, MD  FLUoxetine (PROZAC) 20 MG  tablet Take 2 tablets (40 mg total) by mouth daily. 10/30/16  Yes [name], MD  furosemide (LASIX) 40 MG tablet Take 40 mg by mouth daily as needed for fluid.    Yes [provider]  glucose blood (ACCU-CHEK AVIVA) test strip Use as instructed 3 times daily before meals 10/30/16  Yes Amao, Enobong, MD  insulin aspart (NOVOLOG) 100 UNIT/ML injection Inject 0-12 Units into the skin 3 (three) times daily with meals. Per sliding scale 10/30/16  Yes Amao, [name], MD  insulin detemir (LEVEMIR)  100 UNIT/ML injection Inject 0.25 mLs (25 Units total) into the skin 2 (two) times daily. 01/17/17  Yes Kaliyan Osbourn M, PA-C  Insulin Syringe-Needle U-100 (BD INSULIN SYRINGE ULTRAFINE) 31G X 15/64" 0.5 ML MISC 1 each by Does not apply route 4 (four) times daily as needed. 08/16/15  Yes [name], MD  Lancet Devices [facility]) lancets Use as instructed 3 times daily before meals 10/30/16  Yes Amao, Enobong, MD  LORazepam (ATIVAN) 1 MG tablet Take 1 tablet (1 mg total) by mouth every 8 (eight) hours as needed for anxiety. 01/09/17  Yes Varney Biles, MD  meloxicam (MOBIC) 15 MG tablet Take 1 tablet (15 mg total) by mouth daily. 11/22/16  Yes Gardiner Barefoot, DPM  ondansetron (ZOFRAN ODT) 4 MG disintegrating tablet Take 1 tablet (4 mg total) by mouth every 8 (eight) hours as needed for nausea or vomiting. 09/04/16  Yes [name], MD  polyethylene glycol (MIRALAX / GLYCOLAX) packet Take 17 g by mouth daily as needed for mild constipation. 09/04/16  Yes [name], MD  pregabalin (LYRICA) 150 MG capsule Take 1 capsule (150 mg total) by mouth 2 (two) times daily. 01/17/17  Yes Estevan Kersh, Dionne Bucy, PA-C  promethazine (PHENERGAN) 25 MG tablet Take 1 tablet (25 mg total) by mouth every 6 (six) hours as needed for nausea. 01/09/17  Yes Varney Biles, MD  fluconazole (DIFLUCAN) 150 MG tablet 1 today, 1 Sunday, 1 next wednesday 01/17/17   Argentina Donovan, PA-C  metoCLOPramide (REGLAN) 10 MG tablet Take 1 tablet (10 mg total) by mouth 3 (three) times daily before meals. 10/30/16   [name], MD  nitrofurantoin, macrocrystal-monohydrate, (MACROBID) 100 MG capsule Take 1 capsule (100 mg total) by mouth 2 (two) times daily. 01/17/17   Argentina Donovan, PA-C  norethindrone (ORTHO MICRONOR) 0.35 MG tablet Take 1 tablet (0.35 mg total) by mouth daily. Patient not taking: Reported on 01/09/2017 10/30/16   [name], MD     Objective:  EXAM:   Vitals:   01/17/17 1503  BP: 120/75  Pulse: (!) 101   Resp: 18  Temp: 98.2 F (36.8 C)  TempSrc: Oral  SpO2: 99%  Weight: 137 lb 12.8 oz (62.5 kg)  Height: 5' 2"  (1.575 m)    General appearance : A&OX3. NAD. Non-toxic-appearing HEENT: Atraumatic and Normocephalic.  PERRLA. EOM intact.   Neck: supple, no JVD. No cervical lymphadenopathy. No thyromegaly Chest/Lungs:  Breathing-non-labored, Good air entry bilaterally, breath sounds normal without rales, rhonchi, or wheezing  CVS: S1 S2 regular, no murmurs, gallops, rubs  Abdomen: Bowel sounds present, Non tender and not distended.  No CVA tenderness. Extremities: Bilateral Lower Ext shows no edema, both legs are warm to touch with = pulse throughout Neurology:  CN II-XII grossly intact, Non focal.   Psych:  TP linear. J/I WNL. Normal speech. Appropriate eye contact and affect.  Skin:  No Rash  Data Review Lab Results  Component Value Date  HGBA1C 10.1 01/17/2017   HGBA1C 10.0 (H) 09/27/2016   HGBA1C 9.9 09/04/2016     Assessment & Plan   1. Essential hypertension Controlled today.  Continue current regimen  2. Diabetic neuropathy, type I diabetes mellitus ([place]) Diabetes uncontrolled - Glucose (CBG) - HgB A1c - pregabalin (LYRICA) 150 MG capsule; Take 1 capsule (150 mg total) by mouth 2 (two) times daily.  Dispense: 60 capsule; Refill: 3  3. Dysuria Will cover for infection due to recent UTI and h/o pyelo - POCT urinalysis dipstick - nitrofurantoin, macrocrystal-monohydrate, (MACROBID) 100 MG capsule; Take 1 capsule (100 mg total) by mouth 2 (two) times daily.  Dispense: 10 capsule; Refill: 0 - Urine Culture  4. Diabetic ketoacidosis without coma associated with type 1 diabetes mellitus ([place]) Uncontrolled.  Continue current regimen but will increase dose on Levemir from 20 units bid to 25 units bid - insulin detemir (LEVEMIR) 100 UNIT/ML injection; Inject 0.25 mLs (25 Units total) into the skin 2 (two) times daily.  Dispense: 30 mL; Refill: 11  5. Yeast infection Likely  due to high blood sugars - fluconazole (DIFLUCAN) 150 MG tablet; 1 today, 1 Sunday, 1 next wednesday  Dispense: 3 tablet; Refill: 0  6. Depression, unspecified depression type No SI/HI.  She is going to try [facility] again.  Resources other than [facility] also given.  Today she met with our social worker, Christa See and was offered support resources.  Patient have been counseled extensively about nutrition and exercise  Return in about 3 weeks (around 02/07/2017) for with Theda Sers for diabetes check.  The patient was given clear instructions to go to ER or return to medical center if symptoms don't improve, worsen or new problems develop. The patient verbalized understanding. The patient was told to call to get lab results if they haven't heard anything in the next week.     Freeman Caldron, PA-C [facility], [place]   01/17/2017, 3:42 PMPatient ID: [name], female   DOB: 1966/06/23, 51 y.o.   MRN: [id number]

## 2017-01-17 NOTE — Progress Notes (Signed)
Patient has ate  Patient has had medication

## 2017-01-17 NOTE — Patient Instructions (Signed)
Check blood sugars 3 X daily and record

## 2017-01-17 NOTE — BH Specialist Note (Signed)
Integrated Behavioral Health Follow Up Visit  MRN: [id number] Name: [name]   Session Start time: 4:00 PM Session End time: 4:20 PM Total time: 20 minutes Number of Integrated Behavioral Health Clinician visits: 2/10  Type of Service: [place] Interpretor:No. Interpretor Name and Language: N/A   Warm Hand Off Completed.       SUBJECTIVE: [name] is a 51 y.o. female accompanied by patient and and minor grandson. Patient was referred by Weyman Pedro for depression. Patient reports the following symptoms/concerns: overwhelming feelings of sadness and worry, difficulty sleeping, low energy, feeling like a failure, difficulty concentrating, irritability, and hx of suicidal ideations Duration of problem: Ongoing; Severity of problem: severe  OBJECTIVE: Mood: Depressed and Affect: Depressed Risk of harm to self or others: No plan to harm self or others   LIFE CONTEXT: Family and Social: Pt will soon be residing with sister and brother in [place]. Pt's son has not been in contact for a month resulting in patient caring for minor grandson School/Work: Pt is unemployed and receives financial support from sister and adult son. She receives medicaid and food stamps (food stamps will discontinue in August and will not be re-certified until November 2018) Self-Care: Pt participates in medication management Life Changes: Pt's family (sister and adult son) has discontinued financial support. Pt has another sister and brother in law that has agreed to reside with pt to assist financially.   GOALS ADDRESSED: Patient will reduce symptoms of: anxiety and depression and increase knowledge and/or ability of: coping skills and also: Increase adequate support systems for patient/family and Improve medication compliance  INTERVENTIONS: Mindfulness or Relaxation Training, Supportive Counseling and Link to [employer] Standardized Assessments  completed: GAD-7 and PHQ 2&9  ASSESSMENT: Patient currently experiencing depression and anxiety triggered by financial strain and stress from ongoing medical concerns. Patient reportsoverwhelming feelings of sadness and worry, difficulty sleeping, low energy, feeling like a failure, difficulty concentrating, irritability, and hx of suicidal ideations. Denied SI/HI/AVH. She no longer receives financial support from her sister and adult son; however, has gained the support of another sister and brother in law. Patient participates in medication management. LCSWA discussed benefits of initiating psychotherapy to assist in managing mental health. Patient is open to therapy through [facility] and plans to initiate services in the next week. LCSWA provided pt with additional resources that accepts Medicaid and transportation.  PLAN: 1. Follow up with behavioral health clinician on : Pt was encouraged tocontact LCSWA if symptoms worsen or fail to improveto schedule behavioral appointments at [facility]. 2. Behavioral recommendations: LCSWA recommends that pt apply healthy coping skills discussed, comply with medication management, initiate psychotherapy, and utilize community resources. Pt is encouraged to schedule follow up appointment with LCSWA 3. Referral(s): [employer] logistics/support/administrative officer (LME/Outside Clinic) and [employer]:  Transportation 4. "From scale of 1-10, how likely are you to follow plan?": 8/10  Rebekah Chesterfield, LCSW 01/22/17 4:32 PM

## 2017-01-20 LAB — URINE CULTURE

## 2017-01-30 ENCOUNTER — Telehealth: Payer: Self-pay

## 2017-01-30 ENCOUNTER — Encounter: Payer: Self-pay | Admitting: Family Medicine

## 2017-01-30 ENCOUNTER — Ambulatory Visit: Payer: Medicaid Other | Attending: Family Medicine | Admitting: Family Medicine

## 2017-01-30 VITALS — BP 153/68 | HR 93 | Temp 98.3°F | Ht 62.0 in | Wt 135.4 lb

## 2017-01-30 DIAGNOSIS — Z91199 Patient's noncompliance with other medical treatment and regimen due to unspecified reason: Secondary | ICD-10-CM

## 2017-01-30 DIAGNOSIS — Z888 Allergy status to other drugs, medicaments and biological substances status: Secondary | ICD-10-CM | POA: Diagnosis not present

## 2017-01-30 DIAGNOSIS — D649 Anemia, unspecified: Secondary | ICD-10-CM | POA: Insufficient documentation

## 2017-01-30 DIAGNOSIS — E1010 Type 1 diabetes mellitus with ketoacidosis without coma: Secondary | ICD-10-CM | POA: Diagnosis present

## 2017-01-30 DIAGNOSIS — F329 Major depressive disorder, single episode, unspecified: Secondary | ICD-10-CM | POA: Diagnosis not present

## 2017-01-30 DIAGNOSIS — K3184 Gastroparesis: Secondary | ICD-10-CM | POA: Insufficient documentation

## 2017-01-30 DIAGNOSIS — E785 Hyperlipidemia, unspecified: Secondary | ICD-10-CM | POA: Insufficient documentation

## 2017-01-30 DIAGNOSIS — G43A0 Cyclical vomiting, not intractable: Secondary | ICD-10-CM

## 2017-01-30 DIAGNOSIS — Z880 Allergy status to penicillin: Secondary | ICD-10-CM | POA: Insufficient documentation

## 2017-01-30 DIAGNOSIS — Z9889 Other specified postprocedural states: Secondary | ICD-10-CM | POA: Insufficient documentation

## 2017-01-30 DIAGNOSIS — F419 Anxiety disorder, unspecified: Secondary | ICD-10-CM | POA: Insufficient documentation

## 2017-01-30 DIAGNOSIS — K219 Gastro-esophageal reflux disease without esophagitis: Secondary | ICD-10-CM | POA: Insufficient documentation

## 2017-01-30 DIAGNOSIS — I10 Essential (primary) hypertension: Secondary | ICD-10-CM | POA: Insufficient documentation

## 2017-01-30 DIAGNOSIS — Z794 Long term (current) use of insulin: Secondary | ICD-10-CM | POA: Insufficient documentation

## 2017-01-30 DIAGNOSIS — E1040 Type 1 diabetes mellitus with diabetic neuropathy, unspecified: Secondary | ICD-10-CM | POA: Diagnosis not present

## 2017-01-30 DIAGNOSIS — E1043 Type 1 diabetes mellitus with diabetic autonomic (poly)neuropathy: Secondary | ICD-10-CM | POA: Insufficient documentation

## 2017-01-30 DIAGNOSIS — Z9111 Patient's noncompliance with dietary regimen: Secondary | ICD-10-CM

## 2017-01-30 DIAGNOSIS — F32A Depression, unspecified: Secondary | ICD-10-CM

## 2017-01-30 LAB — GLUCOSE, POCT (MANUAL RESULT ENTRY): POC GLUCOSE: 229 mg/dL — AB (ref 70–99)

## 2017-01-30 MED ORDER — ONDANSETRON HCL 4 MG/2ML IJ SOLN
4.0000 mg | Freq: Once | INTRAMUSCULAR | Status: AC
  Administered 2017-01-30: 4 mg via INTRAMUSCULAR

## 2017-01-30 NOTE — Telephone Encounter (Signed)
Met with the patient at the request of Dr Jarold Song when she was at the clinic for her appointment today.  She explained that she is waiting for documentation from Dr Jarold Song to submit to DSS to continue receiving food stamps. She also noted that she is still waiting on a decision about her disability application.   Explained to her that case management services in the community may benefit her if she is eligible and she was agreeable to a referral being mad to [facility].  She was also agreeable to having a referral made to Dr Darleene Cleaver for psychiatric follow up.

## 2017-01-30 NOTE — Progress Notes (Signed)
Subjective:    Patient ID: [name], female    DOB: 03-09-1966, 51 y.o.   MRN: [id number]  HPI She is a 51 year old female with a history of uncontrolled type 1 diabetes mellitus (A1c 10.0), diabetic neuropathy, diabetic gastroparesis, hypertension, anemia, anxiety and depression, multiple ED visits and hospitalizations for diabetic gastroparesis here for a follow-up visit.  I had referred her to [facility] for gastroparesis however they would not see her because she owed a balance. Also referred to [facility] for optimization of Diabetes but she missed that appointment.  Today she complains of nausea and vomiting despite taking her antiemetics. Denies diarrhea or constipations.  Also complains of worsening anxiety which is uncontrolled on Buspar and Prozac. She was referred to [facility] but did not like her experience there.Denies suicidal ideations or intents. She is requesting a letter statin her medical conditions and inability to work to help her disability case.  Past Medical History:  Diagnosis Date  . Depression   . Diabetes mellitus without complication ([place])   . Essential hypertension   . Gastroparesis   . GERD (gastroesophageal reflux disease)   . HLD (hyperlipidemia)     Past Surgical History:  Procedure Laterality Date  . COLONOSCOPY  09/27/2014   at [facility]  . ESOPHAGOGASTRODUODENOSCOPY  09/27/2014   at [facility], Dr Rolan Lipa. biospy neg for celiac, neg for H pylori.   . EYE SURGERY    . gailstones    . IR GENERIC HISTORICAL  01/24/2016   IR FLUORO GUIDE CV LINE RIGHT 01/24/2016 Darrell K Allred, PA-C [facility]  . IR GENERIC HISTORICAL  01/24/2016   IR US GUIDE VASC ACCESS RIGHT 01/24/2016 Darrell K Allred, PA-C [facility]  . POSTERIOR VITRECTOMY AND MEMBRANE PEEL-LEFT EYE  09/28/2002  . POSTERIOR VITRECTOMY AND MEMBRANE PEEL-RIGHT EYE  03/16/2002  . RETINAL DETACHMENT SURGERY     Allergies  Allergen Reactions  . Anesthetics, Amide Nausea And Vomiting   . Penicillins Diarrhea and Nausea And Vomiting    Has patient had a PCN reaction causing immediate rash, facial/tongue/throat swelling, SOB or lightheadedness with hypotension: No Has patient had a PCN reaction causing severe rash involving mucus membranes or skin necrosis: No Has patient had a PCN reaction that required hospitalization No Has patient had a PCN reaction occurring within the last 10 years: Yes  If all of the above answers are "NO", then may proceed with Cephalosporin use.   . Buprenorphine Hcl Rash  . Encainide Nausea And Vomiting    No current facility-administered medications on file prior to visit.    Current Outpatient Prescriptions on File Prior to Visit  Medication Sig Dispense Refill  . albuterol (PROVENTIL HFA;VENTOLIN HFA) 108 (90 Base) MCG/ACT inhaler Inhale 2 puffs into the lungs every 6 (six) hours as needed for wheezing or shortness of breath. 1 Inhaler 0  . atorvastatin (LIPITOR) 40 MG tablet Take 1 tablet (40 mg total) by mouth daily. 30 tablet 3  . Blood Glucose Monitoring Suppl (ACCU-CHEK AVIVA) device Use as instructed 3 times daily before meals 1 each 0  . busPIRone (BUSPAR) 10 MG tablet Take 1 tablet (10 mg total) by mouth 2 (two) times daily. 60 tablet 3  . famotidine (PEPCID) 20 MG tablet Take 1 tablet (20 mg total) by mouth 2 (two) times daily. 60 tablet 0  . ferrous sulfate 325 (65 FE) MG tablet Take 1 tablet (325 mg total) by mouth daily with breakfast. 30 tablet 0  . FLUoxetine (PROZAC) 20  MG tablet Take 2 tablets (40 mg total) by mouth daily. 60 tablet 3  . furosemide (LASIX) 40 MG tablet Take 40 mg by mouth daily as needed for fluid.     Marland Kitchen glucose blood (ACCU-CHEK AVIVA) test strip Use as instructed 3 times daily before meals 100 each 12  . insulin aspart (NOVOLOG) 100 UNIT/ML injection Inject 0-12 Units into the skin 3 (three) times daily with meals. Per sliding scale 50 mL 3  . insulin detemir (LEVEMIR) 100 UNIT/ML injection Inject 0.25 mLs (25  Units total) into the skin 2 (two) times daily. 30 mL 11  . Insulin Syringe-Needle U-100 (BD INSULIN SYRINGE ULTRAFINE) 31G X 15/64" 0.5 ML MISC 1 each by Does not apply route 4 (four) times daily as needed. 120 each 5  . Lancet Devices (ACCU-CHEK SOFTCLIX) lancets Use as instructed 3 times daily before meals 1 each 5  . meloxicam (MOBIC) 15 MG tablet Take 1 tablet (15 mg total) by mouth daily. (Patient not taking: Reported on 01/31/2017) 30 tablet 0  . metoCLOPramide (REGLAN) 10 MG tablet Take 1 tablet (10 mg total) by mouth 3 (three) times daily before meals. 90 tablet 0  . ondansetron (ZOFRAN ODT) 4 MG disintegrating tablet Take 1 tablet (4 mg total) by mouth every 8 (eight) hours as needed for nausea or vomiting. 60 tablet 2  . polyethylene glycol (MIRALAX / GLYCOLAX) packet Take 17 g by mouth daily as needed for mild constipation. 100 each 0  . pregabalin (LYRICA) 150 MG capsule Take 1 capsule (150 mg total) by mouth 2 (two) times daily. 60 capsule 3  . promethazine (PHENERGAN) 25 MG tablet Take 1 tablet (25 mg total) by mouth every 6 (six) hours as needed for nausea. (Patient not taking: Reported on 01/31/2017) 30 tablet 0  . fluconazole (DIFLUCAN) 150 MG tablet 1 today, 1 Sunday, 1 next wednesday 3 tablet 0  . LORazepam (ATIVAN) 1 MG tablet Take 1 tablet (1 mg total) by mouth every 8 (eight) hours as needed for anxiety. (Patient not taking: Reported on 01/30/2017) 8 tablet 0  . norethindrone (ORTHO MICRONOR) 0.35 MG tablet Take 1 tablet (0.35 mg total) by mouth daily. (Patient not taking: Reported on 01/09/2017) 1 Package 11     Review of Systems Constitutional: Negative for activity change, appetite change and fatigue.  HENT: Negative for congestion, sinus pressure and sore throat.   Eyes: Negative for visual disturbance.  Respiratory: Negative for cough, chest tightness, shortness of breath and wheezing.   Cardiovascular: Negative for chest pain and palpitations.  Gastrointestinal: Positive  for nausea and vomiting. Negative for abdominal distention, abdominal pain and constipation.  Endocrine: Negative for polydipsia.  Genitourinary: Negative for dysuria and frequency.  Musculoskeletal: Negative for arthralgias and back pain.  Skin: Negative for rash.  Neurological: Positive for numbness. Negative for tremors and light-headedness.  Hematological: Does not bruise/bleed easily.  Psychiatric/Behavioral: Positive for dysphoric mood. Negative for agitation and behavioral problems.     Objective: Vitals:   01/30/17 1137  BP: (!) 153/68  Pulse: 93  Temp: 98.3 F (36.8 C)  TempSrc: Oral  SpO2: 100%  Weight: 135 lb 6.4 oz (61.4 kg)  Height: 5\' 2"  (1.575 m)      Physical Exam Constitutional: She is oriented to person, place, and time. She appears well-developed and well-nourished.  Neck: No JVD present.  Cardiovascular: Normal rate, normal heart sounds and intact distal pulses.   No murmur heard. Pulmonary/Chest: Effort normal and breath sounds normal. She has  no wheezes. She has no rales. She exhibits no tenderness.  Abdominal: Soft. Bowel sounds are normal. She exhibits no distension and no mass. There is no tenderness.  Musculoskeletal: Normal range of motion.  Neurological: She is alert and oriented to person, place, and time.  Skin: Skin is warm and dry.  Psychiatric:  tremors with back-and-forth movement at rest but movement is absent when patient is asked to lie supine or perform an action      Lab Results  Component Value Date   HGBA1C 10.1 01/17/2017    CMP Latest Ref Rng & Units 01/09/2017 10/22/2016 10/01/2016  Glucose 65 - 99 mg/dL 230(H) 146(H) 271(H)  BUN 6 - 20 mg/dL 12 12 8   Creatinine 0.44 - 1.00 mg/dL 0.97 0.98 0.84  Sodium 135 - 145 mmol/L 137 135 137  Potassium 3.5 - 5.1 mmol/L 3.7 3.9 3.5  Chloride 101 - 111 mmol/L 99(L) 100(L) 108  CO2 22 - 32 mmol/L 26 23 19(L)  Calcium 8.9 - 10.3 mg/dL 9.6 10.2 8.3(L)  Total Protein 6.5 - 8.1 g/dL 7.7 9.8(H)  7.2  Total Bilirubin 0.3 - 1.2 mg/dL 0.7 1.0 1.2  Alkaline Phos 38 - 126 U/L 89 109 79  AST 15 - 41 U/L 21 21 19   ALT 14 - 54 U/L 13(L) 12(L) 12(L)     Assessment & Plan:  1. Type 1 diabetes mellitus with ketoacidosis without coma (HCC) Uncontrolled with A1c of 10.1 Continue Levemir 25 units twice daily Changed patient's sliding scale to 0-12 units 3 times daily before meals. For blood sugar 0-150 give 0 units of insulin, 151-200 give 2 units, 201 to 250 give 4 units, 251-300 give 6 units, 301-350 give 8 units, 351-400 give 10 units, greater than 400 give 12 units. Encouraged to reschedule the missed appointment with [facility]  2. Anxiety and depression Uncontrolled on Prozac and BuSpar Didn't have a good experience at [facility] referre to psychiatry - we'll have case manager call Dr.Akintayo's ofice for an appointment  3. Diabetic neuropathy, type I diabetes mellitus ([place]) Controlled with Lyrica  4. Diabetic gastroparesis ([place]) Uncontrolled Denied by [facility] due to balance owed Hopefully when she gets into the [facility] system she will be able to see GI Intramuscular Zofran administered in the clinic for nausea and vomiting Continue Zofran and Reglan Provided letter for social services.  This note has been created with Surveyor, quantity. Any transcriptional errors are unintentional.

## 2017-01-31 ENCOUNTER — Telehealth: Payer: Self-pay

## 2017-01-31 ENCOUNTER — Emergency Department (HOSPITAL_COMMUNITY): Payer: Medicaid Other

## 2017-01-31 ENCOUNTER — Encounter (HOSPITAL_COMMUNITY): Payer: Self-pay | Admitting: Emergency Medicine

## 2017-01-31 ENCOUNTER — Inpatient Hospital Stay (HOSPITAL_COMMUNITY)
Admission: EM | Admit: 2017-01-31 | Discharge: 2017-02-03 | DRG: 638 | Disposition: A | Discharge status: Home Health | Payer: Medicaid Other | Attending: Internal Medicine | Admitting: Internal Medicine

## 2017-01-31 DIAGNOSIS — I10 Essential (primary) hypertension: Secondary | ICD-10-CM | POA: Diagnosis present

## 2017-01-31 DIAGNOSIS — E1010 Type 1 diabetes mellitus with ketoacidosis without coma: Principal | ICD-10-CM | POA: Diagnosis present

## 2017-01-31 DIAGNOSIS — Z880 Allergy status to penicillin: Secondary | ICD-10-CM

## 2017-01-31 DIAGNOSIS — F32A Depression, unspecified: Secondary | ICD-10-CM | POA: Diagnosis present

## 2017-01-31 DIAGNOSIS — E1040 Type 1 diabetes mellitus with diabetic neuropathy, unspecified: Secondary | ICD-10-CM | POA: Diagnosis present

## 2017-01-31 DIAGNOSIS — F410 Panic disorder [episodic paroxysmal anxiety] without agoraphobia: Secondary | ICD-10-CM | POA: Diagnosis present

## 2017-01-31 DIAGNOSIS — E1069 Type 1 diabetes mellitus with other specified complication: Secondary | ICD-10-CM | POA: Diagnosis present

## 2017-01-31 DIAGNOSIS — F419 Anxiety disorder, unspecified: Secondary | ICD-10-CM

## 2017-01-31 DIAGNOSIS — Z8249 Family history of ischemic heart disease and other diseases of the circulatory system: Secondary | ICD-10-CM

## 2017-01-31 DIAGNOSIS — N179 Acute kidney failure, unspecified: Secondary | ICD-10-CM | POA: Diagnosis present

## 2017-01-31 DIAGNOSIS — D649 Anemia, unspecified: Secondary | ICD-10-CM | POA: Diagnosis present

## 2017-01-31 DIAGNOSIS — D509 Iron deficiency anemia, unspecified: Secondary | ICD-10-CM | POA: Diagnosis present

## 2017-01-31 DIAGNOSIS — E1110 Type 2 diabetes mellitus with ketoacidosis without coma: Secondary | ICD-10-CM

## 2017-01-31 DIAGNOSIS — Z791 Long term (current) use of non-steroidal anti-inflammatories (NSAID): Secondary | ICD-10-CM

## 2017-01-31 DIAGNOSIS — Z884 Allergy status to anesthetic agent status: Secondary | ICD-10-CM

## 2017-01-31 DIAGNOSIS — R112 Nausea with vomiting, unspecified: Secondary | ICD-10-CM | POA: Diagnosis present

## 2017-01-31 DIAGNOSIS — Z833 Family history of diabetes mellitus: Secondary | ICD-10-CM

## 2017-01-31 DIAGNOSIS — F329 Major depressive disorder, single episode, unspecified: Secondary | ICD-10-CM | POA: Diagnosis present

## 2017-01-31 DIAGNOSIS — D72828 Other elevated white blood cell count: Secondary | ICD-10-CM | POA: Diagnosis not present

## 2017-01-31 DIAGNOSIS — E1143 Type 2 diabetes mellitus with diabetic autonomic (poly)neuropathy: Secondary | ICD-10-CM | POA: Diagnosis present

## 2017-01-31 DIAGNOSIS — E785 Hyperlipidemia, unspecified: Secondary | ICD-10-CM | POA: Diagnosis present

## 2017-01-31 DIAGNOSIS — Z888 Allergy status to other drugs, medicaments and biological substances status: Secondary | ICD-10-CM

## 2017-01-31 DIAGNOSIS — E1043 Type 1 diabetes mellitus with diabetic autonomic (poly)neuropathy: Secondary | ICD-10-CM | POA: Diagnosis present

## 2017-01-31 DIAGNOSIS — Z794 Long term (current) use of insulin: Secondary | ICD-10-CM

## 2017-01-31 DIAGNOSIS — K219 Gastro-esophageal reflux disease without esophagitis: Secondary | ICD-10-CM | POA: Diagnosis present

## 2017-01-31 DIAGNOSIS — K3184 Gastroparesis: Secondary | ICD-10-CM | POA: Diagnosis present

## 2017-01-31 DIAGNOSIS — D72829 Elevated white blood cell count, unspecified: Secondary | ICD-10-CM | POA: Diagnosis present

## 2017-01-31 LAB — GLUCOSE, CAPILLARY
GLUCOSE-CAPILLARY: 181 mg/dL — AB (ref 65–99)
GLUCOSE-CAPILLARY: 218 mg/dL — AB (ref 65–99)
GLUCOSE-CAPILLARY: 259 mg/dL — AB (ref 65–99)

## 2017-01-31 LAB — COMPREHENSIVE METABOLIC PANEL
ALT: 12 U/L — AB (ref 14–54)
AST: 18 U/L (ref 15–41)
Albumin: 4 g/dL (ref 3.5–5.0)
Alkaline Phosphatase: 80 U/L (ref 38–126)
Anion gap: 20 — ABNORMAL HIGH (ref 5–15)
BUN: 26 mg/dL — AB (ref 6–20)
CHLORIDE: 108 mmol/L (ref 101–111)
CO2: 10 mmol/L — AB (ref 22–32)
CREATININE: 1.4 mg/dL — AB (ref 0.44–1.00)
Calcium: 9 mg/dL (ref 8.9–10.3)
GFR calc Af Amer: 50 mL/min — ABNORMAL LOW (ref >60)
GFR calc non Af Amer: 43 mL/min — ABNORMAL LOW (ref >60)
Glucose, Bld: 579 mg/dL (ref 65–99)
Potassium: 4.6 mmol/L (ref 3.5–5.1)
SODIUM: 138 mmol/L (ref 135–145)
Total Bilirubin: 1.7 mg/dL — ABNORMAL HIGH (ref 0.3–1.2)
Total Protein: 7.7 g/dL (ref 6.5–8.1)

## 2017-01-31 LAB — CBG MONITORING, ED
GLUCOSE-CAPILLARY: 520 mg/dL — AB (ref 65–99)
GLUCOSE-CAPILLARY: 476 mg/dL — AB (ref 65–99)
GLUCOSE-CAPILLARY: 421 mg/dL — AB (ref 65–99)
Glucose-Capillary: 522 mg/dL (ref 65–99)
Glucose-Capillary: 359 mg/dL — ABNORMAL HIGH (ref 65–99)
Glucose-Capillary: 280 mg/dL — ABNORMAL HIGH (ref 65–99)

## 2017-01-31 LAB — URINALYSIS, ROUTINE W REFLEX MICROSCOPIC
BACTERIA UA: NONE SEEN
Bilirubin Urine: NEGATIVE
Glucose, UA: >=500 mg/dL — AB
KETONES UR: 80 mg/dL — AB
Leukocytes, UA: NEGATIVE
NITRITE: NEGATIVE
PROTEIN: 100 mg/dL — AB
Specific Gravity, Urine: 1.022 (ref 1.005–1.030)
pH: 5 (ref 5.0–8.0)

## 2017-01-31 LAB — CBC WITH DIFFERENTIAL/PLATELET
Basophils Absolute: 0 10*3/uL (ref 0.0–0.1)
Basophils Relative: 0 %
EOS ABS: 0 10*3/uL (ref 0.0–0.7)
Eosinophils Relative: 0 %
HCT: 30.7 % — ABNORMAL LOW (ref 36.0–46.0)
Hemoglobin: 9.3 g/dL — ABNORMAL LOW (ref 12.0–15.0)
Lymphocytes Relative: 3 %
Lymphs Abs: 0.6 10*3/uL — ABNORMAL LOW (ref 0.7–4.0)
MCH: 21.9 pg — ABNORMAL LOW (ref 26.0–34.0)
MCHC: 30.3 g/dL (ref 30.0–36.0)
MCV: 72.4 fL — ABNORMAL LOW (ref 78.0–100.0)
MONO ABS: 0.9 10*3/uL (ref 0.1–1.0)
Monocytes Relative: 5 %
NEUTROS PCT: 92 %
Neutro Abs: 16.9 10*3/uL — ABNORMAL HIGH (ref 1.7–7.7)
PLATELETS: 412 10*3/uL — AB (ref 150–400)
RBC: 4.24 MIL/uL (ref 3.87–5.11)
RDW: 16.8 % — ABNORMAL HIGH (ref 11.5–15.5)
WBC: 18.4 10*3/uL — AB (ref 4.0–10.5)

## 2017-01-31 LAB — TROPONIN I

## 2017-01-31 LAB — I-STAT BETA HCG BLOOD, ED (MC, WL, AP ONLY): I-stat hCG, quantitative: <5 m[IU]/mL (ref <5)

## 2017-01-31 LAB — LIPASE, BLOOD: LIPASE: 20 U/L (ref 11–51)

## 2017-01-31 LAB — MRSA PCR SCREENING: MRSA by PCR: NEGATIVE

## 2017-01-31 MED ORDER — SODIUM CHLORIDE 0.9 % IV SOLN
INTRAVENOUS | Status: AC
  Administered 2017-01-31: 22:00:00 via INTRAVENOUS

## 2017-01-31 MED ORDER — ALBUTEROL SULFATE (2.5 MG/3ML) 0.083% IN NEBU: 3.0000 mL | INHALATION_SOLUTION | Freq: Four times a day (QID) | RESPIRATORY_TRACT | Status: DC | PRN

## 2017-01-31 MED ORDER — POLYETHYLENE GLYCOL 3350 17 G PO PACK: 17.0000 g | PACK | Freq: Every day | ORAL | Status: DC | PRN

## 2017-01-31 MED ORDER — BUSPIRONE HCL 5 MG PO TABS
10.0000 mg | ORAL_TABLET | Freq: Two times a day (BID) | ORAL | Status: DC
  Administered 2017-01-31 – 2017-02-03 (×6): 10 mg via ORAL
  Filled 2017-01-31: qty 1
  Filled 2017-01-31 (×4): qty 2
  Filled 2017-01-31: qty 1

## 2017-01-31 MED ORDER — PREGABALIN 75 MG PO CAPS
150.0000 mg | ORAL_CAPSULE | Freq: Two times a day (BID) | ORAL | Status: DC
  Administered 2017-01-31 – 2017-02-03 (×6): 150 mg via ORAL
  Filled 2017-01-31 (×6): qty 2

## 2017-01-31 MED ORDER — SODIUM CHLORIDE 0.9 % IV BOLUS (SEPSIS)
1000.0000 mL | Freq: Once | INTRAVENOUS | Status: AC
  Administered 2017-01-31: 1000 mL via INTRAVENOUS

## 2017-01-31 MED ORDER — DEXTROSE-NACL 5-0.45 % IV SOLN
INTRAVENOUS | Status: DC
  Administered 2017-01-31 – 2017-02-01 (×2): via INTRAVENOUS

## 2017-01-31 MED ORDER — DEXTROSE-NACL 5-0.45 % IV SOLN: INTRAVENOUS | Status: DC

## 2017-01-31 MED ORDER — INSULIN REGULAR HUMAN 100 UNIT/ML IJ SOLN
INTRAMUSCULAR | Status: DC
  Administered 2017-01-31: 4.6 [IU]/h via INTRAVENOUS
  Filled 2017-01-31: qty 1

## 2017-01-31 MED ORDER — ENOXAPARIN SODIUM 40 MG/0.4ML ~~LOC~~ SOLN
40.0000 mg | Freq: Every day | SUBCUTANEOUS | Status: DC
  Administered 2017-01-31 – 2017-02-02 (×3): 40 mg via SUBCUTANEOUS
  Filled 2017-01-31 (×3): qty 0.4

## 2017-01-31 MED ORDER — ATORVASTATIN CALCIUM 40 MG PO TABS
40.0000 mg | ORAL_TABLET | Freq: Every day | ORAL | Status: DC
  Administered 2017-02-01 – 2017-02-03 (×3): 40 mg via ORAL
  Filled 2017-01-31 (×3): qty 1

## 2017-01-31 MED ORDER — POTASSIUM CHLORIDE 10 MEQ/100ML IV SOLN: 10.0000 meq | INTRAVENOUS | Status: AC

## 2017-01-31 MED ORDER — PANTOPRAZOLE SODIUM 40 MG IV SOLR
40.0000 mg | Freq: Once | INTRAVENOUS | Status: AC
  Administered 2017-01-31: 40 mg via INTRAVENOUS
  Filled 2017-01-31: qty 40

## 2017-01-31 MED ORDER — METOCLOPRAMIDE HCL 5 MG/ML IJ SOLN
10.0000 mg | Freq: Four times a day (QID) | INTRAMUSCULAR | Status: DC | PRN
  Administered 2017-02-01: 10 mg via INTRAVENOUS
  Filled 2017-01-31: qty 2

## 2017-01-31 MED ORDER — FLUOXETINE HCL 20 MG PO CAPS
40.0000 mg | ORAL_CAPSULE | Freq: Every day | ORAL | Status: DC
  Administered 2017-02-01 – 2017-02-03 (×3): 40 mg via ORAL
  Filled 2017-01-31 (×4): qty 2

## 2017-01-31 MED ORDER — SODIUM CHLORIDE 0.9 % IV SOLN: INTRAVENOUS | Status: DC

## 2017-01-31 MED ORDER — SODIUM CHLORIDE 0.9 % IV SOLN: INTRAVENOUS | Status: AC

## 2017-01-31 MED ORDER — FUROSEMIDE 40 MG PO TABS: 40.0000 mg | ORAL_TABLET | Freq: Every day | ORAL | Status: DC | PRN

## 2017-01-31 MED ORDER — METOCLOPRAMIDE HCL 5 MG/ML IJ SOLN
10.0000 mg | Freq: Once | INTRAMUSCULAR | Status: AC
  Administered 2017-01-31: 10 mg via INTRAVENOUS
  Filled 2017-01-31: qty 2

## 2017-01-31 MED ORDER — LORAZEPAM 2 MG/ML IJ SOLN
1.0000 mg | Freq: Once | INTRAMUSCULAR | Status: AC
  Administered 2017-01-31: 1 mg via INTRAVENOUS
  Filled 2017-01-31: qty 1

## 2017-01-31 MED ORDER — POTASSIUM CHLORIDE 10 MEQ/100ML IV SOLN: 10.0000 meq | INTRAVENOUS | Status: DC

## 2017-01-31 MED ORDER — SODIUM CHLORIDE 0.9 % IV SOLN
INTRAVENOUS | Status: DC
  Administered 2017-01-31: 16:00:00 via INTRAVENOUS

## 2017-01-31 MED ORDER — SODIUM CHLORIDE 0.9 % IV SOLN
INTRAVENOUS | Status: DC
  Administered 2017-02-01: 01:00:00 via INTRAVENOUS
  Filled 2017-01-31 (×2): qty 1

## 2017-01-31 MED ORDER — SODIUM CHLORIDE 0.9 % IV SOLN
8.0000 mg | Freq: Three times a day (TID) | INTRAVENOUS | Status: DC | PRN
  Filled 2017-01-31: qty 4

## 2017-01-31 MED ORDER — PANTOPRAZOLE SODIUM 40 MG IV SOLR
40.0000 mg | Freq: Every day | INTRAVENOUS | Status: DC
  Filled 2017-01-31: qty 40

## 2017-01-31 NOTE — ED Notes (Signed)
Pt requested bedpan. Pt provided urine and stool. Could not obtain clean urine sample. Pt is aware a urine sample is needed.

## 2017-01-31 NOTE — Telephone Encounter (Signed)
Call received from Marcelino Scot, [place].  Informed her of the referral for disease management services. She requested that it be faxed to [facility] to her attention. Referral faxed as requested.

## 2017-01-31 NOTE — Telephone Encounter (Signed)
Call placed to Dr Marquis Buggy office to inquire about scheduling an appointment for the patient.  Voicemail message left for Latoya in referrals requesting a call back to # [phone]

## 2017-01-31 NOTE — ED Notes (Signed)
IV team unable to get blood work. Lab/phleb called.

## 2017-01-31 NOTE — ED Provider Notes (Signed)
[facility] DEPT Provider Note   CSN: [id number] Arrival date & time: 01/31/17  1043     History   Chief Complaint Chief Complaint  Patient presents with  . Nausea  . Emesis  . Hyperglycemia    HPI [name] is a 51 y.o. female.  The history is provided by the patient.  Emesis   This is a new problem. The current episode started yesterday. The problem occurs 5 to 10 times per day. The problem has not changed since onset.The emesis has an appearance of stomach contents. There has been no fever. Pertinent negatives include no cough, no diarrhea, no fever, no sweats and no URI.   51 year old female who presents with nausea and vomiting. History of type 1 diabetes complicated by recurrent gastroparesis. States onset of nausea vomiting yesterday that felt insistent with typical symptoms of gastroparesis. States that she has been compliant with her insulin.  No diarrhea, fever, cough, difficulty breathing, chest pain. Denies any fevers or chills.   Past Medical History:  Diagnosis Date  . Depression   . Diabetes mellitus without complication ([place])   . Essential hypertension   . Gastroparesis   . GERD (gastroesophageal reflux disease)   . HLD (hyperlipidemia)     Patient Active Problem List   Diagnosis Date Noted  . Gastroparesis 04/20/2016  . Type 1 diabetes mellitus with ketoacidosis without coma ([place])   . DKA (diabetic ketoacidoses) ([place]) 03/18/2016  . Noncompliance with treatment plan 03/13/2016  . Essential hypertension 01/24/2016  . Leukocytosis 01/24/2016  . Abnormal ECG 11/27/2015  . HLD (hyperlipidemia)   . GERD (gastroesophageal reflux disease)   . Metabolic acidosis, normal anion gap (NAG)   . AKI (acute kidney injury) ([place])   . Anemia, iron deficiency   . DKA, type 1, not at goal [facility])   . Hyponatremia 09/02/2015  . Vitamin B12 deficiency 08/16/2015  . Lactic acidosis 07/29/2015  . Acute pyelonephritis 07/29/2015  . Pyelonephritis   . Hypothermia     . Diabetic gastroparesis ([place])   . Diabetic neuropathy, type I diabetes mellitus ([place]) 05/18/2015  . Anemia 05/18/2015  . Anxiety and depression 05/18/2015  . Nausea & vomiting   . Diabetic ketoacidosis without coma associated with type 1 diabetes mellitus ([place])   . High anion gap metabolic acidosis 35/46/5681    Past Surgical History:  Procedure Laterality Date  . COLONOSCOPY  09/27/2014   at [facility]  . ESOPHAGOGASTRODUODENOSCOPY  09/27/2014   at [facility], Dr Rolan Lipa. biospy neg for celiac, neg for H pylori.   . EYE SURGERY    . gailstones    . IR GENERIC HISTORICAL  01/24/2016   IR FLUORO GUIDE CV LINE RIGHT 01/24/2016 Darrell K Allred, PA-C [facility]  . IR GENERIC HISTORICAL  01/24/2016   IR US GUIDE VASC ACCESS RIGHT 01/24/2016 Darrell K Allred, PA-C [facility]  . POSTERIOR VITRECTOMY AND MEMBRANE PEEL-LEFT EYE  09/28/2002  . POSTERIOR VITRECTOMY AND MEMBRANE PEEL-RIGHT EYE  03/16/2002  . RETINAL DETACHMENT SURGERY      OB History    No data available       Home Medications    Prior to Admission medications   Medication Sig Start Date End Date Taking? Authorizing Provider  albuterol (PROVENTIL HFA;VENTOLIN HFA) 108 (90 Base) MCG/ACT inhaler Inhale 2 puffs into the lungs every 6 (six) hours as needed for wheezing or shortness of breath. 10/20/15   [name], MD  atorvastatin (LIPITOR) 40 MG tablet Take 1 tablet (40 mg  total) by mouth daily. 10/30/16   [name], MD  Blood Glucose Monitoring Suppl (ACCU-CHEK AVIVA) device Use as instructed 3 times daily before meals 10/30/16   [name], MD  busPIRone (BUSPAR) 10 MG tablet Take 1 tablet (10 mg total) by mouth 2 (two) times daily. 10/30/16   [name], MD  famotidine (PEPCID) 20 MG tablet Take 1 tablet (20 mg total) by mouth 2 (two) times daily. 09/29/16   Doreatha Lew, MD  ferrous sulfate 325 (65 FE) MG tablet Take 1 tablet (325 mg total) by mouth daily with breakfast. 03/27/16   Donne Hazel, MD  fluconazole  (DIFLUCAN) 150 MG tablet 1 today, 1 Sunday, 1 next wednesday Patient not taking: Reported on 01/30/2017 01/17/17   Argentina Donovan, PA-C  FLUoxetine (PROZAC) 20 MG tablet Take 2 tablets (40 mg total) by mouth daily. 10/30/16   [name], MD  furosemide (LASIX) 40 MG tablet Take 40 mg by mouth daily as needed for fluid.     [provider]  glucose blood (ACCU-CHEK AVIVA) test strip Use as instructed 3 times daily before meals 10/30/16   [name], MD  insulin aspart (NOVOLOG) 100 UNIT/ML injection Inject 0-12 Units into the skin 3 (three) times daily with meals. Per sliding scale 10/30/16   [name], MD  insulin detemir (LEVEMIR) 100 UNIT/ML injection Inject 0.25 mLs (25 Units total) into the skin 2 (two) times daily. 01/17/17   Argentina Donovan, PA-C  Insulin Syringe-Needle U-100 (BD INSULIN SYRINGE ULTRAFINE) 31G X 15/64" 0.5 ML MISC 1 each by Does not apply route 4 (four) times daily as needed. 08/16/15   [name], MD  Lancet Devices [facility]) lancets Use as instructed 3 times daily before meals 10/30/16   [name], MD  LORazepam (ATIVAN) 1 MG tablet Take 1 tablet (1 mg total) by mouth every 8 (eight) hours as needed for anxiety. Patient not taking: Reported on 01/30/2017 01/09/17   Varney Biles, MD  meloxicam (MOBIC) 15 MG tablet Take 1 tablet (15 mg total) by mouth daily. 11/22/16   Gardiner Barefoot, DPM  metoCLOPramide (REGLAN) 10 MG tablet Take 1 tablet (10 mg total) by mouth 3 (three) times daily before meals. 10/30/16   [name], MD  norethindrone (ORTHO MICRONOR) 0.35 MG tablet Take 1 tablet (0.35 mg total) by mouth daily. Patient not taking: Reported on 01/09/2017 10/30/16   [name], MD  ondansetron (ZOFRAN ODT) 4 MG disintegrating tablet Take 1 tablet (4 mg total) by mouth every 8 (eight) hours as needed for nausea or vomiting. 09/04/16   [name], MD  polyethylene glycol (MIRALAX / GLYCOLAX) packet Take 17 g by mouth daily as needed for mild  constipation. 09/04/16   [name], MD  pregabalin (LYRICA) 150 MG capsule Take 1 capsule (150 mg total) by mouth 2 (two) times daily. 01/17/17   Argentina Donovan, PA-C  promethazine (PHENERGAN) 25 MG tablet Take 1 tablet (25 mg total) by mouth every 6 (six) hours as needed for nausea. 01/09/17   Varney Biles, MD    Family History Family History  Problem Relation Age of Onset  . Cystic fibrosis Mother   . Hypertension Father   . Diabetes Brother   . Hypertension Maternal Grandmother     Social History Social History  Substance Use Topics  . Smoking status: Never Smoker  . Smokeless tobacco: Never Used  . Alcohol use No     Allergies   Anesthetics, amide; Penicillins; Buprenorphine  hcl; and Encainide   Review of Systems Review of Systems  Constitutional: Negative for fever.  Respiratory: Negative for cough.   Gastrointestinal: Negative for diarrhea.  All other systems reviewed and are negative.    Physical Exam Updated Vital Signs BP (!) 119/57   Pulse (!) 102   Temp 98.2 F (36.8 C) (Oral)   Resp (!) 27   SpO2 95%   Physical Exam Physical Exam  Nursing note and vitals reviewed. Constitutional: Well developed, moaning and groaning and spitting into a plastic bag Head: Normocephalic and atraumatic.  Mouth/Throat: Oropharynx is clear and dry mucous membranes  Neck: Normal range of motion. Neck supple.  Cardiovascular: Tachycardic rate and regular rhythm.   Pulmonary/Chest: Effort normal and breath sounds normal.  Abdominal: Soft. There is minimal epigastric tenderness. There is no rebound and no guarding.  Musculoskeletal: No deformities Neurological: Alert, no facial droop, moves all extremities symmetrically Skin: Skin is warm and dry.  Psychiatric: Cooperative   ED Treatments / Results  Labs (all labs ordered are listed, but only abnormal results are displayed) Labs Reviewed  CBC WITH DIFFERENTIAL/PLATELET - Abnormal; Notable for the following:         Result Value   WBC 18.4 (*)    Hemoglobin 9.3 (*)    HCT 30.7 (*)    MCV 72.4 (*)    MCH 21.9 (*)    RDW 16.8 (*)    Platelets 412 (*)    Neutro Abs 16.9 (*)    Lymphs Abs 0.6 (*)    All other components within normal limits  COMPREHENSIVE METABOLIC PANEL - Abnormal; Notable for the following:    CO2 10 (*)    Glucose, Bld 579 (*)    BUN 26 (*)    Creatinine, Ser 1.40 (*)    ALT 12 (*)    Total Bilirubin 1.7 (*)    GFR calc non Af Amer 43 (*)    GFR calc Af Amer 50 (*)    Anion gap 20 (*)    All other components within normal limits  CBG MONITORING, ED - Abnormal; Notable for the following:    Glucose-Capillary 522 (*)    All other components within normal limits  LIPASE, BLOOD  URINALYSIS, ROUTINE W REFLEX MICROSCOPIC  TROPONIN I  I-STAT BETA HCG BLOOD, ED ([facility], [facility], [facility] ONLY)    EKG  EKG Interpretation  Date/Time:  Thursday January 31 2017 11:30:29 EDT Ventricular Rate:  110 PR Interval:    QRS Duration: 94 QT Interval:  357 QTC Calculation: 479 R Axis:   -38 Text Interpretation:  Sinus tachycardia Ventricular premature complex Left axis deviation ST depr, consider ischemia, inferior leads Confirmed by Brantley Stage [phone]) on 01/31/2017 2:53:25 PM       Radiology No results found.  Procedures Procedures (including critical care time) CRITICAL CARE Performed by: Forde Dandy   Total critical care time: 35 minutes  Critical care time was exclusive of separately billable procedures and treating other patients.  Critical care was necessary to treat or prevent imminent or life-threatening deterioration.  Critical care was time spent personally by me on the following activities: development of treatment plan with patient and/or surrogate as well as nursing, discussions with consultants, evaluation of patient's response to treatment, examination of patient, obtaining history from patient or surrogate, ordering and performing treatments and interventions, ordering  and review of laboratory studies, ordering and review of radiographic studies, pulse oximetry and re-evaluation of patient's condition.  Medications Ordered in  ED Medications  0.9 %  sodium chloride infusion (not administered)  0.9 %  sodium chloride infusion (not administered)  dextrose 5 %-0.45 % sodium chloride infusion (not administered)  insulin regular (NOVOLIN R,HUMULIN R) 100 Units in sodium chloride 0.9 % 100 mL (1 Units/mL) infusion (not administered)  potassium chloride 10 mEq in 100 mL IVPB (not administered)  sodium chloride 0.9 % bolus 1,000 mL (1,000 mLs Intravenous New Bag/Given 01/31/17 1225)  metoCLOPramide (REGLAN) injection 10 mg (10 mg Intravenous Given 01/31/17 1223)  pantoprazole (PROTONIX) injection 40 mg (40 mg Intravenous Given 01/31/17 1224)  LORazepam (ATIVAN) injection 1 mg (1 mg Intravenous Given 01/31/17 1224)  sodium chloride 0.9 % bolus 1,000 mL (1,000 mLs Intravenous New Bag/Given 01/31/17 1223)     Initial Impression / Assessment and Plan / ED Course  I have reviewed the triage vital signs and the nursing notes.  Pertinent labs & imaging results that were available during my care of the patient were reviewed by me and considered in my medical decision making (see chart for details).    History of type 1 diabetes who presents with recurrent nausea and vomiting. Does have hyperglycemia and appears to be in DKA today. She has a bicarbonate of 10 and an anion gap of 20. She started on insulin drip and received 2 L of IV fluids. Urinalysis is pending. Does not appear to have any infectious symptoms. Discussed with Dr. Jamse Arn who will admit for ongoing care.   Difficult IV and blood draw, so care slightly delayed due to that. Small IV in right hand. PICC nurse consulted for PICC line placement.   Final Clinical Impressions(s) / ED Diagnoses   Final diagnoses:  Diabetic ketoacidosis without coma associated with type 1 diabetes mellitus ([place])    New  Prescriptions New Prescriptions   No medications on file     Forde Dandy, MD 01/31/17 1520

## 2017-01-31 NOTE — ED Notes (Signed)
IV team Rene[facility] called. They refuse to do pt PICC line at bedside. State that it must be done in IR. MD made aware.

## 2017-01-31 NOTE — ED Triage Notes (Signed)
Patient her via EMS with complaints of abdominal pain, nausea/vomiting, hyperglycemia. CBG 600. Hx gastroparesis.

## 2017-01-31 NOTE — ED Notes (Signed)
Pt is sticking her fingers down her throat to make herself vomit.

## 2017-01-31 NOTE — H&P (Addendum)
History and Physical:    [name]   LKG:[id number] DOB: 1966/03/06 DOA: 01/31/2017  Referring MD/provider: Dr Oleta Mouse PCP: [name], MD   Patient coming from: home  Chief Complaint: nausea and vomiting   History of Present Illness:   [name] is an 51 y.o. female with past medical history significant for diabetes, diabetic gastroparesis, hypertension who has had multiple episodes of nausea and vomiting secondary due to poorly controlled gastric parous cyst. Patient states she was in her usual state of health until 2 days ago when she started having nausea and vomiting again. She stopped taking her short-acting insulin at that time because she was not being able to eat anything however she states she did continue her basal insulin. She was seen by her PCP and got IM injection of Phenergan which was not very effective. She now comes in for management of nausea and vomiting.  Patient's main concern right now is anxiety. She states she gets panic attacks when she gets sick. She is requesting more IV Ativan. She does feel her nausea is somewhat improved but not resolved with treatment with Reglan.  ED Course:  Patient was treated with IV fluids and Reglan. Workup revealed a bicarbonate of 10 with a gap of 20. She was thought to be in DKA due to elevated blood sugars of 579. She was put on DKA protocol and is being admitted for further management.  ROS: patient denies fevers or chills. Denies dysuria or flank pain. Denies diarrhea. Denies cough or shortness of breath.       ROS Diagnosis Date  . Depression   . Diabetes mellitus without complication ([place])   . Essential hypertension   . Gastroparesis   . GERD (gastroesophageal reflux disease)   . HLD (hyperlipidemia)   So wasYates  Past Surgical History:   Past Surgical History:  Procedure Laterality Date  . COLONOSCOPY  09/27/2014   at [facility]  . ESOPHAGOGASTRODUODENOSCOPY  09/27/2014   at [facility], Dr Rolan Lipa.  biospy neg for celiac, neg for H pylori.   . EYE SURGERY    . gailstones    . IR GENERIC HISTORICAL  01/24/2016   IR FLUORO GUIDE CV LINE RIGHT 01/24/2016 Darrell K Allred, PA-C [facility]  . IR GENERIC HISTORICAL  01/24/2016   IR US GUIDE VASC ACCESS RIGHT 01/24/2016 Darrell K Allred, PA-C [facility]  . POSTERIOR VITRECTOMY AND MEMBRANE PEEL-LEFT EYE  09/28/2002  . POSTERIOR VITRECTOMY AND MEMBRANE PEEL-RIGHT EYE  03/16/2002  . RETINAL DETACHMENT SURGERY      Social History:   Social History   Social History  . Marital status: Single    Spouse name: N/A  . Number of children: N/A  . Years of education: N/A   Occupational History  . Not on file.   Social History Main Topics  . Smoking status: Never Smoker  . Smokeless tobacco: Never Used  . Alcohol use No  . Drug use: No  . Sexual activity: Not Currently   Other Topics Concern  . Not on file   Social History Narrative  . No narrative on file    Allergies   Anesthetics, amide; Penicillins; Buprenorphine hcl; and Encainide  Family history:   Family History  Problem Relation Age of Onset  . Cystic fibrosis Mother   . Hypertension Father   . Diabetes Brother   . Hypertension Maternal Grandmother     Current Medications:   Prior to Admission medications   Medication Sig Start  Date End Date Taking? Authorizing Provider  albuterol (PROVENTIL HFA;VENTOLIN HFA) 108 (90 Base) MCG/ACT inhaler Inhale 2 puffs into the lungs every 6 (six) hours as needed for wheezing or shortness of breath. 10/20/15  Yes Amao, [name], MD  atorvastatin (LIPITOR) 40 MG tablet Take 1 tablet (40 mg total) by mouth daily. 10/30/16  Yes [name], MD  Blood Glucose Monitoring Suppl (ACCU-CHEK AVIVA) device Use as instructed 3 times daily before meals 10/30/16  Yes Amao, Enobong, MD  busPIRone (BUSPAR) 10 MG tablet Take 1 tablet (10 mg total) by mouth 2 (two) times daily. 10/30/16  Yes [name], MD  famotidine (PEPCID) 20 MG tablet Take 1 tablet  (20 mg total) by mouth 2 (two) times daily. 09/29/16  Yes Patrecia Pour, Christean Grief, MD  ferrous sulfate 325 (65 FE) MG tablet Take 1 tablet (325 mg total) by mouth daily with breakfast. 03/27/16  Yes Donne Hazel, MD  fluconazole (DIFLUCAN) 150 MG tablet 1 today, 1 Sunday, 1 next wednesday 01/17/17  Yes McClung, Angela M, PA-C  FLUoxetine (PROZAC) 20 MG tablet Take 2 tablets (40 mg total) by mouth daily. 10/30/16  Yes [name], MD  furosemide (LASIX) 40 MG tablet Take 40 mg by mouth daily as needed for fluid.    Yes [provider]  glucose blood (ACCU-CHEK AVIVA) test strip Use as instructed 3 times daily before meals 10/30/16  Yes Amao, Enobong, MD  insulin aspart (NOVOLOG) 100 UNIT/ML injection Inject 0-12 Units into the skin 3 (three) times daily with meals. Per sliding scale 10/30/16  Yes Amao, [name], MD  insulin detemir (LEVEMIR) 100 UNIT/ML injection Inject 0.25 mLs (25 Units total) into the skin 2 (two) times daily. 01/17/17  Yes McClung, Angela M, PA-C  Insulin Syringe-Needle U-100 (BD INSULIN SYRINGE ULTRAFINE) 31G X 15/64" 0.5 ML MISC 1 each by Does not apply route 4 (four) times daily as needed. 08/16/15  Yes [name], MD  Lancet Devices [facility]) lancets Use as instructed 3 times daily before meals 10/30/16  Yes Amao, [name], MD  metoCLOPramide (REGLAN) 10 MG tablet Take 1 tablet (10 mg total) by mouth 3 (three) times daily before meals. 10/30/16  Yes [name], MD  ondansetron (ZOFRAN ODT) 4 MG disintegrating tablet Take 1 tablet (4 mg total) by mouth every 8 (eight) hours as needed for nausea or vomiting. 09/04/16  Yes [name], MD  polyethylene glycol (MIRALAX / GLYCOLAX) packet Take 17 g by mouth daily as needed for mild constipation. 09/04/16  Yes [name], MD  pregabalin (LYRICA) 150 MG capsule Take 1 capsule (150 mg total) by mouth 2 (two) times daily. 01/17/17  Yes McClung, Dionne Bucy, PA-C  LORazepam (ATIVAN) 1 MG tablet Take 1 tablet (1 mg total) by mouth  every 8 (eight) hours as needed for anxiety. Patient not taking: Reported on 01/30/2017 01/09/17   Varney Biles, MD  meloxicam (MOBIC) 15 MG tablet Take 1 tablet (15 mg total) by mouth daily. Patient not taking: Reported on 01/31/2017 11/22/16   Gardiner Barefoot, DPM  norethindrone (ORTHO MICRONOR) 0.35 MG tablet Take 1 tablet (0.35 mg total) by mouth daily. Patient not taking: Reported on 01/09/2017 10/30/16   [name], MD  promethazine (PHENERGAN) 25 MG tablet Take 1 tablet (25 mg total) by mouth every 6 (six) hours as needed for nausea. Patient not taking: Reported on 01/31/2017 01/09/17   Varney Biles, MD    Physical Exam:   Vitals:   01/31/17 1111 01/31/17 1414 01/31/17 1500  BP: (!) 155/77 (!) 136/59 (!) 119/57  Pulse: (!) 104 (!) 105 (!) 102  Resp: 18 20 (!) 27  Temp: 98.2 F (36.8 C)    TempSrc: Oral    SpO2: 100% 99% 95%     Physical Exam: Blood pressure (!) 119/57, pulse (!) 102, temperature 98.2 F (36.8 C), temperature source Oral, resp. rate (!) 27, SpO2 95 %. Gen: Patient lying in bed clearly nauseated with back at bedside with a small amount of spit and it. Head: Normocephalic, atraumatic. Eyes: Sclerae nonicteric. Conjunctiva are pale Neck: No JVD Chest: Lungs are clear to auscultation with good air movement. No rales, rhonchi or wheezes.  CV: Tachycardia E0-C1 with systolic murmur at left sternal border. Abdomen: Active bowel sounds, normal pitch and tone, soft, nontender but patient notes she feels more nauseated when palpated.  Extremities: Extremities are without clubbing, or cyanosis. No edema.  Skin: Warm and dry. No rashes, lesions or wounds. Neuro: Alert and oriented times 3; grossly nonfocal. Psych: Patient has a bit of an odd affect. She is quite anxious. She is a bit regressed.  Data Review:    Labs: Basic Metabolic Panel:  Recent Labs Lab 01/31/17 1334  NA 138  K 4.6  CL 108  CO2 10*  GLUCOSE 579*  BUN 26*  CREATININE 1.40*  CALCIUM 9.0     Liver Function Tests:  Recent Labs Lab 01/31/17 1334  AST 18  ALT 12*  ALKPHOS 80  BILITOT 1.7*  PROT 7.7  ALBUMIN 4.0    Recent Labs Lab 01/31/17 1334  LIPASE 20   No results for input(s): AMMONIA in the last 168 hours. CBC:  Recent Labs Lab 01/31/17 1334  WBC 18.4*  NEUTROABS 16.9*  HGB 9.3*  HCT 30.7*  MCV 72.4*  PLT 412*   Cardiac Enzymes:  Recent Labs Lab 01/31/17 1334  TROPONINI <0.03    BNP (last 3 results) No results for input(s): PROBNP in the last 8760 hours. CBG:  Recent Labs Lab 01/31/17 1115 01/31/17 1530 01/31/17 1637 01/31/17 1744 01/31/17 1850  GLUCAP 522* 520* 476* 421* 359*    Urinalysis    Component Value Date/Time   COLORURINE STRAW (A) 01/31/2017 1509   APPEARANCEUR CLEAR 01/31/2017 1509   LABSPEC 1.022 01/31/2017 1509   PHURINE 5.0 01/31/2017 1509   GLUCOSEU >=500 (A) 01/31/2017 1509   HGBUR LARGE (A) 01/31/2017 1509   BILIRUBINUR NEGATIVE 01/31/2017 1509   BILIRUBINUR neg 01/17/2017 1533   KETONESUR 80 (A) 01/31/2017 1509   PROTEINUR 100 (A) 01/31/2017 1509   UROBILINOGEN 0.2 01/17/2017 1533   UROBILINOGEN 0.2 04/27/2015 1608   NITRITE NEGATIVE 01/31/2017 1509   LEUKOCYTESUR NEGATIVE 01/31/2017 1509      Radiographic Studies: Dg Chest Portable 1 View  Result Date: 01/31/2017 CLINICAL DATA:  DKA. EXAM: PORTABLE CHEST 1 VIEW COMPARISON:  03/22/2016 FINDINGS: The cardiomediastinal silhouette is unremarkable. There is no evidence of focal airspace disease, pulmonary edema, suspicious pulmonary nodule/mass, pleural effusion, or pneumothorax. No acute bony abnormalities are identified. IMPRESSION: No active disease. Electronically Signed   By: Margarette Canada M.D.   On: 01/31/2017 17:58     Assessment/Plan:   Active Problems:   Nausea & vomiting   Anxiety and depression   Diabetic gastroparesis (HCC)   Essential hypertension   DKA (diabetic ketoacidoses) ([place])  DKA Patient is to be admitted to stepdown unit  with DKA protocol in place. I suspect some of her acidosis may well be ketoacidosis from decreased by mouth intake as  well. This will improve when we are able to feed her once her sugars are down as well. Continue aggressive IV fluid resuscitation.  LEUKOCYTOSIS Most likely secondary to stress D margination. No evidence of infection. Patient is afebrile chest x-ray is negative and UA is negative.    DIABETIC GASTROPARESIS We'll continue management with when necessary Reglan, Compazine, ondansetron and continued IV fluid resuscitation.   ANXIETY AND DEPRESSION Continue BuSpar and Prozac. I have agreed to give patient 1 mg Ativan IV 1 when she gets up stairs.    Other information:   DVT prophylaxis: Lovenox ordered. Code Status: Full code. Family Communication: Patient states it's not necessary to contact family as they know she is here. Disposition Plan: Home Consults called: None Admission status: Observation, stepdown   The medical decision making is of moderate complexity, therefore this is a level 2 visit.  Dewaine Oats Derek Jack [facility] Pager [phone] Cell: [phone]   If 7PM-7AM, please contact night-coverage [email or web address] Password [facility] 01/31/2017, 6:52 PM

## 2017-01-31 NOTE — ED Notes (Signed)
Bed: WA21 Expected date:  Expected time:  Means of arrival:  Comments: EMS gastrop

## 2017-01-31 NOTE — ED Notes (Signed)
ICU requested that I call back in 15 mins before taking the patient upstairs.

## 2017-02-01 ENCOUNTER — Encounter (HOSPITAL_COMMUNITY): Payer: Self-pay | Admitting: Interventional Radiology

## 2017-02-01 ENCOUNTER — Inpatient Hospital Stay (HOSPITAL_COMMUNITY): Payer: Medicaid Other

## 2017-02-01 DIAGNOSIS — K219 Gastro-esophageal reflux disease without esophagitis: Secondary | ICD-10-CM | POA: Diagnosis present

## 2017-02-01 DIAGNOSIS — K3184 Gastroparesis: Secondary | ICD-10-CM | POA: Diagnosis present

## 2017-02-01 DIAGNOSIS — D72828 Other elevated white blood cell count: Secondary | ICD-10-CM | POA: Diagnosis not present

## 2017-02-01 DIAGNOSIS — F419 Anxiety disorder, unspecified: Secondary | ICD-10-CM | POA: Diagnosis not present

## 2017-02-01 DIAGNOSIS — D649 Anemia, unspecified: Secondary | ICD-10-CM | POA: Diagnosis present

## 2017-02-01 DIAGNOSIS — Z791 Long term (current) use of non-steroidal anti-inflammatories (NSAID): Secondary | ICD-10-CM | POA: Diagnosis not present

## 2017-02-01 DIAGNOSIS — Z833 Family history of diabetes mellitus: Secondary | ICD-10-CM | POA: Diagnosis not present

## 2017-02-01 DIAGNOSIS — E1043 Type 1 diabetes mellitus with diabetic autonomic (poly)neuropathy: Secondary | ICD-10-CM | POA: Diagnosis present

## 2017-02-01 DIAGNOSIS — E1040 Type 1 diabetes mellitus with diabetic neuropathy, unspecified: Secondary | ICD-10-CM | POA: Diagnosis not present

## 2017-02-01 DIAGNOSIS — F410 Panic disorder [episodic paroxysmal anxiety] without agoraphobia: Secondary | ICD-10-CM | POA: Diagnosis present

## 2017-02-01 DIAGNOSIS — Z884 Allergy status to anesthetic agent status: Secondary | ICD-10-CM | POA: Diagnosis not present

## 2017-02-01 DIAGNOSIS — E1143 Type 2 diabetes mellitus with diabetic autonomic (poly)neuropathy: Secondary | ICD-10-CM | POA: Diagnosis not present

## 2017-02-01 DIAGNOSIS — Z794 Long term (current) use of insulin: Secondary | ICD-10-CM | POA: Diagnosis not present

## 2017-02-01 DIAGNOSIS — I10 Essential (primary) hypertension: Secondary | ICD-10-CM | POA: Diagnosis present

## 2017-02-01 DIAGNOSIS — F329 Major depressive disorder, single episode, unspecified: Secondary | ICD-10-CM | POA: Diagnosis present

## 2017-02-01 DIAGNOSIS — E1010 Type 1 diabetes mellitus with ketoacidosis without coma: Secondary | ICD-10-CM | POA: Diagnosis present

## 2017-02-01 DIAGNOSIS — Z880 Allergy status to penicillin: Secondary | ICD-10-CM | POA: Diagnosis not present

## 2017-02-01 DIAGNOSIS — Z8249 Family history of ischemic heart disease and other diseases of the circulatory system: Secondary | ICD-10-CM | POA: Diagnosis not present

## 2017-02-01 DIAGNOSIS — N179 Acute kidney failure, unspecified: Secondary | ICD-10-CM | POA: Diagnosis present

## 2017-02-01 DIAGNOSIS — E785 Hyperlipidemia, unspecified: Secondary | ICD-10-CM | POA: Diagnosis present

## 2017-02-01 DIAGNOSIS — Z888 Allergy status to other drugs, medicaments and biological substances status: Secondary | ICD-10-CM | POA: Diagnosis not present

## 2017-02-01 HISTORY — PX: IR US GUIDE VASC ACCESS RIGHT: IMG2390

## 2017-02-01 HISTORY — PX: IR FLUORO GUIDE CV LINE RIGHT: IMG2283

## 2017-02-01 LAB — BASIC METABOLIC PANEL
ANION GAP: 11 (ref 5–15)
Anion gap: 9 (ref 5–15)
BUN: 24 mg/dL — AB (ref 6–20)
BUN: 20 mg/dL (ref 6–20)
CHLORIDE: 120 mmol/L — AB (ref 101–111)
CHLORIDE: 115 mmol/L — AB (ref 101–111)
CO2: 18 mmol/L — AB (ref 22–32)
CO2: 17 mmol/L — AB (ref 22–32)
Calcium: 9 mg/dL (ref 8.9–10.3)
Calcium: 8.5 mg/dL — ABNORMAL LOW (ref 8.9–10.3)
Creatinine, Ser: 1.27 mg/dL — ABNORMAL HIGH (ref 0.44–1.00)
Creatinine, Ser: 1.08 mg/dL — ABNORMAL HIGH (ref 0.44–1.00)
GFR calc Af Amer: 56 mL/min — ABNORMAL LOW (ref >60)
GFR calc Af Amer: >60 mL/min (ref >60)
GFR calc non Af Amer: 49 mL/min — ABNORMAL LOW (ref >60)
GFR calc non Af Amer: 59 mL/min — ABNORMAL LOW (ref >60)
GLUCOSE: 134 mg/dL — AB (ref 65–99)
GLUCOSE: 171 mg/dL — AB (ref 65–99)
POTASSIUM: 3.8 mmol/L (ref 3.5–5.1)
POTASSIUM: 3.9 mmol/L (ref 3.5–5.1)
Sodium: 149 mmol/L — ABNORMAL HIGH (ref 135–145)
Sodium: 141 mmol/L (ref 135–145)

## 2017-02-01 LAB — CBC
HEMATOCRIT: 31 % — AB (ref 36.0–46.0)
Hemoglobin: 9.4 g/dL — ABNORMAL LOW (ref 12.0–15.0)
MCH: 22.4 pg — ABNORMAL LOW (ref 26.0–34.0)
MCHC: 30.3 g/dL (ref 30.0–36.0)
MCV: 74 fL — AB (ref 78.0–100.0)
Platelets: 414 10*3/uL — ABNORMAL HIGH (ref 150–400)
RBC: 4.19 MIL/uL (ref 3.87–5.11)
RDW: 17.4 % — AB (ref 11.5–15.5)
WBC: 19.6 10*3/uL — AB (ref 4.0–10.5)

## 2017-02-01 LAB — GLUCOSE, CAPILLARY
GLUCOSE-CAPILLARY: 128 mg/dL — AB (ref 65–99)
GLUCOSE-CAPILLARY: 106 mg/dL — AB (ref 65–99)
GLUCOSE-CAPILLARY: 108 mg/dL — AB (ref 65–99)
GLUCOSE-CAPILLARY: 119 mg/dL — AB (ref 65–99)
GLUCOSE-CAPILLARY: 136 mg/dL — AB (ref 65–99)
GLUCOSE-CAPILLARY: 200 mg/dL — AB (ref 65–99)
Glucose-Capillary: 154 mg/dL — ABNORMAL HIGH (ref 65–99)
Glucose-Capillary: 103 mg/dL — ABNORMAL HIGH (ref 65–99)
Glucose-Capillary: 103 mg/dL — ABNORMAL HIGH (ref 65–99)
Glucose-Capillary: 175 mg/dL — ABNORMAL HIGH (ref 65–99)
Glucose-Capillary: 155 mg/dL — ABNORMAL HIGH (ref 65–99)
Glucose-Capillary: 159 mg/dL — ABNORMAL HIGH (ref 65–99)
Glucose-Capillary: 163 mg/dL — ABNORMAL HIGH (ref 65–99)
Glucose-Capillary: 150 mg/dL — ABNORMAL HIGH (ref 65–99)

## 2017-02-01 MED ORDER — INSULIN ASPART 100 UNIT/ML ~~LOC~~ SOLN: 0.0000 [IU] | Freq: Three times a day (TID) | SUBCUTANEOUS | Status: DC

## 2017-02-01 MED ORDER — INSULIN DETEMIR 100 UNIT/ML ~~LOC~~ SOLN
18.0000 [IU] | Freq: Two times a day (BID) | SUBCUTANEOUS | Status: DC
  Administered 2017-02-01 (×2): 18 [IU] via SUBCUTANEOUS
  Filled 2017-02-01 (×3): qty 0.18

## 2017-02-01 MED ORDER — INSULIN ASPART 100 UNIT/ML ~~LOC~~ SOLN
0.0000 [IU] | Freq: Three times a day (TID) | SUBCUTANEOUS | Status: DC
  Administered 2017-02-01 – 2017-02-02 (×3): 1 [IU] via SUBCUTANEOUS
  Administered 2017-02-03: 7 [IU] via SUBCUTANEOUS
  Administered 2017-02-03: 2 [IU] via SUBCUTANEOUS

## 2017-02-01 MED ORDER — ONDANSETRON 4 MG PO TBDP
4.0000 mg | ORAL_TABLET | Freq: Three times a day (TID) | ORAL | Status: DC | PRN
  Administered 2017-02-02 – 2017-02-03 (×2): 4 mg via ORAL
  Filled 2017-02-01 (×2): qty 1

## 2017-02-01 MED ORDER — METOCLOPRAMIDE HCL 10 MG PO TABS
10.0000 mg | ORAL_TABLET | Freq: Three times a day (TID) | ORAL | Status: DC
  Administered 2017-02-01 – 2017-02-02 (×3): 10 mg via ORAL
  Filled 2017-02-01 (×3): qty 1

## 2017-02-01 MED ORDER — POTASSIUM CHLORIDE CRYS ER 20 MEQ PO TBCR
20.0000 meq | EXTENDED_RELEASE_TABLET | Freq: Once | ORAL | Status: AC
  Administered 2017-02-01: 20 meq via ORAL
  Filled 2017-02-01: qty 1

## 2017-02-01 MED ORDER — PROMETHAZINE HCL 25 MG PO TABS: 25.0000 mg | ORAL_TABLET | Freq: Four times a day (QID) | ORAL | Status: DC | PRN

## 2017-02-01 MED ORDER — LIDOCAINE-EPINEPHRINE (PF) 2 %-1:200000 IJ SOLN
INTRAMUSCULAR | Status: AC
  Filled 2017-02-01: qty 20

## 2017-02-01 MED ORDER — FAMOTIDINE 20 MG PO TABS
20.0000 mg | ORAL_TABLET | Freq: Two times a day (BID) | ORAL | Status: DC
  Administered 2017-02-01 – 2017-02-03 (×5): 20 mg via ORAL
  Filled 2017-02-01 (×5): qty 1

## 2017-02-01 MED ORDER — LIDOCAINE HCL 1 % IJ SOLN
INTRAMUSCULAR | Status: DC | PRN
  Administered 2017-02-01: 5 mL

## 2017-02-01 MED ORDER — INSULIN ASPART 100 UNIT/ML ~~LOC~~ SOLN: 0.0000 [IU] | Freq: Every day | SUBCUTANEOUS | Status: DC

## 2017-02-01 NOTE — Procedures (Signed)
Pre procedural Diagnosis: Poor venous access Post Procedural Diagnosis: Same  Successful placement of right brachial vein approach 31 cm dual lumen PICC line with tip at the superior caval-atrial junction.    EBL: None  No immediate post procedural complication.  The PICC line is ready for immediate use.  Ronny Bacon, MD Pager #: [phone]

## 2017-02-01 NOTE — Progress Notes (Signed)
Called floor RN that IR place PICC due to scarring.

## 2017-02-01 NOTE — Progress Notes (Signed)
MEDICATION-RELATED CONSULT NOTE   IR Procedure Consult - Anticoagulant/Antiplatelet PTA/Inpatient Med List Review by Pharmacist    Procedure: PICC placement    Completed: 8/10 at 17:10  Post-Procedural bleeding risk per IR MD assessment:  Low  Antithrombotic medications on inpatient or PTA profile prior to procedure:   Enoxaparin 40mg  SQ q24h    Recommended restart time per IR Post-Procedure Guidelines:   At least 4h   Other considerations:      Plan:      Continue enoxaparin as ordered, next dose due at 2200 (5h from [place], PharmD, BCPS.   Pager: [phone] 02/01/2017 5:52 PM

## 2017-02-01 NOTE — Progress Notes (Signed)
Inpatient Diabetes Program Recommendations  AACE/ADA: New Consensus Statement on Inpatient Glycemic Control (2015)  Target Ranges:  Prepandial:   less than 140 mg/dL      Peak postprandial:   less than 180 mg/dL (1-2 hours)      Critically ill patients:  140 - 180 mg/dL   Lab Results  Component Value Date   GLUCAP 163 (H) 02/01/2017   HGBA1C 10.1 01/17/2017    Review of Glycemic Control  Diabetes history: DM1 Outpatient Diabetes medications: Levemir 25 units bid, Novolog 0-12 units tidwc Current orders for Inpatient glycemic control: Levemir 18 units bid, Novolog 0-9 units tidwc and hs  Very familiar with pt from previous visits. Transitioned off IV insulin. Given basal prior to discontinuing drip.  AG - 9 CO2 - 17  Inpatient Diabetes Program Recommendations:    Add Novolog 4 units tidwc for meal coverage insulin if pt eats > 50% meal  Will follow.  Thank you. Lorenda Peck, RD, LDN, CDE Inpatient Diabetes Coordinator [phone]

## 2017-02-01 NOTE — Care Management Note (Signed)
Case Management Note  Patient Details  Name: [name] MRN: [id number] Date of Birth: [birth year]  Subjective/Objective:     Diabetic gastroporesis.               Action/Plan: Date:  February 01, 2017 Chart reviewed for concurrent status and case management needs. Will continue to follow patient progress. Discharge Planning: following for needs Expected discharge date: [id number] Velva Harman, BSN, [place], [place]  Expected Discharge Date:   (unknown)               Expected Discharge Plan:  Home/Self Care  In-House Referral:     Discharge planning Services  CM Consult  Post Acute Care Choice:    Choice offered to:     DME Arranged:    DME Agency:     HH Arranged:    [place] Agency:     Status of Service:  In process, will continue to follow  If discussed at Long Length of Stay Meetings, dates discussed:    Additional Comments:  Leeroy Cha, RN 02/01/2017, 8:30 AM

## 2017-02-01 NOTE — Progress Notes (Addendum)
PROGRESS NOTE    [name]  YYT:[id number] DOB: 11-May-1966 DOA: 01/31/2017 PCP: Yvette Morale, MD   Brief Narrative:  51 yo F with PMH of T1DM, gastroparesis, HTN, anxiety/depression presenting after 2 days of nausea and vomiting and stopping her short acting insulin (notes she continued the basal) who presented with nausea, vomiting and DKA.  Gap closed this morning and BG's in the 100's on the insulin gtt.    Assessment & Plan:   Active Problems:   Nausea & vomiting   Anxiety and depression   Diabetic gastroparesis (HCC)   Essential hypertension   DKA (diabetic ketoacidoses) ([place])   DKA Gap closed this morning, has NAGMA likely due to NS administration.  Plan to start 18 units levemir BID (home dose is 25 units BID) after she's eating and d/c insulin gtt, orders placed.  Sensitive sliding scale insulin also ordered.    Leukocytosis No si/sx of infection, follow up CBC today.  CXR and UA without evidence of infection, though UA with blood/proteinuria (would repeat as outpatient, she notes she's on her period).   Diabetic Gastropareis Will convert IV reglan to PO, 10 mg TID (received prior to my interview and likely why she was so sleepy during my interview). PRN zofran and phenergan PO ordered.  CTM qtc (slightly elevated on EKG yesterday), repeat EKG ordered for today  Acute Kidney Injury Likely due to DKA, N/V above.  Baseline 0.8-0.9, improved from yesterday from 1.4 to 1.27, follow up repeat BMP.   Anxiety and Depression Buspar and prozac continued  Gerd D/c IV PPI and resume famotidine  DVT prophylaxis: lovenox Code Status: full Family Communication: no family in room Disposition Plan: Likely tomorrow   Consultants:   IR for PICC   Procedures:  Possible PICC placement   Antimicrobials: None   Subjective: She notes no vomiting since yesterday.  Still [name] little nauseous.  Overall feels better, but is still tired and nauseous.  Tired, but thinks this is  b/c of difficulty sleeping at home.  No fevers, feels [name] bit cold.    Objective: Vitals:   02/01/17 0756 02/01/17 0800 02/01/17 0900 02/01/17 1000  BP:  (!) 127/54 (!) 128/47 (!) 105/59  Pulse:  90 95 (!) 105  Resp:  15 13 15   Temp: (!) 97.5 F (36.4 C)     TempSrc: Axillary     SpO2:  100% 100% 100%    Intake/Output Summary (Last 24 hours) at 02/01/17 1027 Last data filed at 02/01/17 0900  Gross per 24 hour  Intake           3715.9 ml  Output              360 ml  Net           3355.9 ml   There were no vitals filed for this visit.  Examination:  General exam: Appears calm and comfortable.  Sleepy during our interview. Respiratory system: Clear to auscultation. Respiratory effort normal. Cardiovascular system: S1 & S2 heard, RRR. No JVD, murmurs, rubs, gallops or clicks. No pedal edema. Gastrointestinal system: Abdomen is nondistended, soft and nontender. No organomegaly or masses felt. Normal bowel sounds heard. Central nervous system: Alert and oriented. No focal neurological deficits. Extremities: Symmetric 5 x 5 power. Skin: No rashes, lesions or ulcers Psychiatry: Judgement and insight appear normal. Mood & affect appropriate.   Data Reviewed: I have personally reviewed following labs and imaging studies  CBC:  Recent Labs Lab 01/31/17 1334  WBC  18.4*  NEUTROABS 16.9*  HGB 9.3*  HCT 30.7*  MCV 72.4*  PLT 250*   Basic Metabolic Panel:  Recent Labs Lab 01/31/17 1334 02/01/17 0258  NA 138 149*  K 4.6 3.8  CL 108 120*  CO2 10* 18*  GLUCOSE 579* 134*  BUN 26* 24*  CREATININE 1.40* 1.27*  CALCIUM 9.0 9.0   GFR: Estimated Creatinine Clearance: 46.2 mL/min ([name]) (by C-G formula based on SCr of 1.27 mg/dL (H)). Liver Function Tests:  Recent Labs Lab 01/31/17 1334  AST 18  ALT 12*  ALKPHOS 80  BILITOT 1.7*  PROT 7.7  ALBUMIN 4.0    Recent Labs Lab 01/31/17 1334  LIPASE 20   No results for input(s): AMMONIA in the last 168 hours. Coagulation  Profile: No results for input(s): INR, PROTIME in the last 168 hours. Cardiac Enzymes:  Recent Labs Lab 01/31/17 1334  TROPONINI <0.03   BNP (last 3 results) No results for input(s): PROBNP in the last 8760 hours. HbA1C: No results for input(s): HGBA1C in the last 72 hours. CBG:  Recent Labs Lab 02/01/17 0448 02/01/17 0631 02/01/17 0740 02/01/17 0843 02/01/17 0955  GLUCAP 106* 103* 108* 119* 136*   Lipid Profile: No results for input(s): CHOL, HDL, LDLCALC, TRIG, CHOLHDL, LDLDIRECT in the last 72 hours. Thyroid Function Tests: No results for input(s): TSH, T4TOTAL, FREET4, T3FREE, THYROIDAB in the last 72 hours. Anemia Panel: No results for input(s): VITAMINB12, FOLATE, FERRITIN, TIBC, IRON, RETICCTPCT in the last 72 hours. Sepsis Labs: No results for input(s): PROCALCITON, LATICACIDVEN in the last 168 hours.  Recent Results (from the past 240 hour(s))  MRSA PCR Screening     Status: None   Collection Time: 01/31/17  8:52 PM  Result Value Ref Range Status   MRSA by PCR NEGATIVE NEGATIVE Final    Comment:        The GeneXpert MRSA Assay (FDA approved for NASAL specimens only), is one component of [name] comprehensive MRSA colonization surveillance program. It is not intended to diagnose MRSA infection nor to guide or monitor treatment for MRSA infections.          Radiology Studies: Dg Chest Portable 1 View  Result Date: 01/31/2017 CLINICAL DATA:  DKA. EXAM: PORTABLE CHEST 1 VIEW COMPARISON:  03/22/2016 FINDINGS: The cardiomediastinal silhouette is unremarkable. There is no evidence of focal airspace disease, pulmonary edema, suspicious pulmonary nodule/mass, pleural effusion, or pneumothorax. No acute bony abnormalities are identified. IMPRESSION: No active disease. Electronically Signed   By: [name] M.D.   On: 01/31/2017 17:58        Scheduled Meds: . atorvastatin  40 mg Oral Daily  . busPIRone  10 mg Oral BID  . enoxaparin (LOVENOX) injection  40  mg Subcutaneous QHS  . FLUoxetine  40 mg Oral Daily  . insulin aspart  0-9 Units Subcutaneous TID WC  . insulin detemir  18 Units Subcutaneous BID  . pantoprazole (PROTONIX) IV  40 mg Intravenous Daily  . pregabalin  150 mg Oral BID   Continuous Infusions: . dextrose 5 % and 0.45% NaCl 125 mL/hr at 02/01/17 0900  . insulin (NOVOLIN-R) infusion 0.4 Units/hr (02/01/17 0955)  . ondansetron (ZOFRAN) IV       LOS: 0 days    Time spent: 25 minutes    Ridwan Bondy Melven Sartorius, MD [facility] Pager [phone]  If 7PM-7AM, please contact night-coverage [email or web address] Password [email or web address] 02/01/2017, 10:27 AM

## 2017-02-01 NOTE — Progress Notes (Signed)
Initial Nutrition Assessment  DOCUMENTATION CODES:   Not applicable  INTERVENTION:  - Continue to encourage PO intakes of meals and beverages. - Will order Premier Protein at follow-up, once N/V improved/resolved.  - RD will continue to monitor for additional needs.  NUTRITION DIAGNOSIS:   Inadequate oral intake related to acute illness, chronic illness, nausea, vomiting (uncontrolled gastroparesis) as evidenced by per patient/family report.  GOAL:   Patient will meet greater than or equal to 90% of their needs  MONITOR:   PO intake, Weight trends, Labs, I & O's  REASON FOR ASSESSMENT:   Malnutrition Screening Tool  ASSESSMENT:   51 y.o. female with past medical history significant for diabetes, diabetic gastroparesis, hypertension who has had multiple episodes of nausea and vomiting secondary due to poorly controlled gastroparesis. Patient states she was in her usual state of health until 2 days ago when she started having nausea and vomiting again. She stopped taking her short-acting insulin at that time because she was not being able to eat anything however she states she did continue her basal insulin. She was seen by her PCP and got IM injection of Phenergan which was not very effective. She now comes in for management of nausea and vomiting  Pt seen for MST. BMI indicates normal weight. No intakes documented since admission. Unable to talk with pt x2 attempts. Will obtain further information at follow-up. DM Coordinator consulted. Pt was last seen by an RD on 09/28/16.   Physical assessment done on that date found no muscle and no fat wasting. Unable to do nutrition-focused physical assessment today but will attempt at follow-up. Per chart review, current weight is consistent with weight on 4/6. She gained 13 lbs from 4/8-4/30 and weight relatively stable from that time until 7/9. From 7/9-8/8 pt lost 14 lbs. Will monitor weight trends during hospitalization. Unable to state  malnutrition at this time but will update if needed.  Medications reviewed; 20 mg oral Pepcid BID, sliding scale Novolog, 18 units Levemir BID, 10 mg IV Reglan TID, 40 mg IV Protonix x1 dose yesterday, 20 mEq oral KCl x1 dose today.  Labs reviewed; CBGs: 103-175 mg/dL since midnight, Na: 149 mmol/L, Cl: 120 mmol/L, BUN: 24 mg/dL, creatinine: 1.27 mg/dL, GFR: 49 mL/min.  IVF: D5-1/2 NS @ 125 mL/hr (510 kcal from dextrose).    Diet Order:  Diet Carb Modified Fluid consistency: Thin; Room service appropriate? Yes  Skin:  Reviewed, no issues  Last BM:  PTA/unknown  Height:   Ht Readings from Last 1 Encounters:  01/30/17 5\' 2"  (1.575 m)    Weight:   Wt Readings from Last 1 Encounters:  01/30/17 135 lb 6.4 oz (61.4 kg)    Ideal Body Weight:  50 kg  BMI:  There is no height or weight on file to calculate BMI.  Estimated Nutritional Needs:   Kcal:  1535-1720 (25-28 kcal/kg)  Protein:  60-70 grams  Fluid:  >/= 2 L/day  EDUCATION NEEDS:   Education needs no appropriate at this time    Jarome Matin, MS, RD, LDN, CNSC Inpatient Clinical Dietitian Pager # [phone] After hours/weekend pager # [phone]

## 2017-02-02 DIAGNOSIS — G43A0 Cyclical vomiting, not intractable: Secondary | ICD-10-CM

## 2017-02-02 DIAGNOSIS — F329 Major depressive disorder, single episode, unspecified: Secondary | ICD-10-CM

## 2017-02-02 DIAGNOSIS — E1143 Type 2 diabetes mellitus with diabetic autonomic (poly)neuropathy: Secondary | ICD-10-CM

## 2017-02-02 DIAGNOSIS — K3184 Gastroparesis: Secondary | ICD-10-CM

## 2017-02-02 DIAGNOSIS — K219 Gastro-esophageal reflux disease without esophagitis: Secondary | ICD-10-CM

## 2017-02-02 DIAGNOSIS — E1010 Type 1 diabetes mellitus with ketoacidosis without coma: Principal | ICD-10-CM

## 2017-02-02 DIAGNOSIS — D72829 Elevated white blood cell count, unspecified: Secondary | ICD-10-CM

## 2017-02-02 DIAGNOSIS — E785 Hyperlipidemia, unspecified: Secondary | ICD-10-CM

## 2017-02-02 DIAGNOSIS — F419 Anxiety disorder, unspecified: Secondary | ICD-10-CM

## 2017-02-02 DIAGNOSIS — N179 Acute kidney failure, unspecified: Secondary | ICD-10-CM

## 2017-02-02 DIAGNOSIS — I10 Essential (primary) hypertension: Secondary | ICD-10-CM

## 2017-02-02 LAB — CBC WITH DIFFERENTIAL/PLATELET
Basophils Absolute: 0 10*3/uL (ref 0.0–0.1)
Basophils Relative: 0 %
EOS ABS: 0.2 10*3/uL (ref 0.0–0.7)
Eosinophils Relative: 2 %
HCT: 25.7 % — ABNORMAL LOW (ref 36.0–46.0)
HEMOGLOBIN: 8 g/dL — AB (ref 12.0–15.0)
LYMPHS ABS: 2.2 10*3/uL (ref 0.7–4.0)
Lymphocytes Relative: 24 %
MCH: 22.9 pg — AB (ref 26.0–34.0)
MCHC: 31.1 g/dL (ref 30.0–36.0)
MCV: 73.4 fL — ABNORMAL LOW (ref 78.0–100.0)
Monocytes Absolute: 0.6 10*3/uL (ref 0.1–1.0)
Monocytes Relative: 6 %
NEUTROS PCT: 68 %
Neutro Abs: 6.1 10*3/uL (ref 1.7–7.7)
Platelets: 325 10*3/uL (ref 150–400)
RBC: 3.5 MIL/uL — AB (ref 3.87–5.11)
RDW: 17.8 % — ABNORMAL HIGH (ref 11.5–15.5)
WBC: 9.1 10*3/uL (ref 4.0–10.5)

## 2017-02-02 LAB — BASIC METABOLIC PANEL
ANION GAP: 8 (ref 5–15)
BUN: 17 mg/dL (ref 6–20)
CALCIUM: 8.2 mg/dL — AB (ref 8.9–10.3)
CHLORIDE: 113 mmol/L — AB (ref 101–111)
CO2: 18 mmol/L — AB (ref 22–32)
Creatinine, Ser: 1.07 mg/dL — ABNORMAL HIGH (ref 0.44–1.00)
GFR calc non Af Amer: 60 mL/min — ABNORMAL LOW (ref >60)
Glucose, Bld: 157 mg/dL — ABNORMAL HIGH (ref 65–99)
POTASSIUM: 4 mmol/L (ref 3.5–5.1)
Sodium: 139 mmol/L (ref 135–145)

## 2017-02-02 LAB — FOLATE: FOLATE: 16.5 ng/mL (ref >5.9)

## 2017-02-02 LAB — IRON AND TIBC
Iron: 91 ug/dL (ref 28–170)
SATURATION RATIOS: 27 % (ref 10.4–31.8)
TIBC: 343 ug/dL (ref 250–450)
UIBC: 252 ug/dL

## 2017-02-02 LAB — RETICULOCYTES
RBC.: 3.5 MIL/uL — ABNORMAL LOW (ref 3.87–5.11)
RETIC COUNT ABSOLUTE: 31.5 10*3/uL (ref 19.0–186.0)
Retic Ct Pct: 0.9 % (ref 0.4–3.1)

## 2017-02-02 LAB — GLUCOSE, CAPILLARY
GLUCOSE-CAPILLARY: 175 mg/dL — AB (ref 65–99)
GLUCOSE-CAPILLARY: 141 mg/dL — AB (ref 65–99)
GLUCOSE-CAPILLARY: 135 mg/dL — AB (ref 65–99)
Glucose-Capillary: 61 mg/dL — ABNORMAL LOW (ref 65–99)
Glucose-Capillary: 123 mg/dL — ABNORMAL HIGH (ref 65–99)

## 2017-02-02 LAB — VITAMIN B12: VITAMIN B 12: 252 pg/mL (ref 180–914)

## 2017-02-02 LAB — FERRITIN: Ferritin: 8 ng/mL — ABNORMAL LOW (ref 11–307)

## 2017-02-02 MED ORDER — ACETAMINOPHEN 325 MG PO TABS
650.0000 mg | ORAL_TABLET | Freq: Once | ORAL | Status: AC
  Administered 2017-02-02: 650 mg via ORAL
  Filled 2017-02-02: qty 2

## 2017-02-02 MED ORDER — INSULIN DETEMIR 100 UNIT/ML ~~LOC~~ SOLN
16.0000 [IU] | Freq: Two times a day (BID) | SUBCUTANEOUS | Status: DC
  Administered 2017-02-02 – 2017-02-03 (×3): 16 [IU] via SUBCUTANEOUS
  Filled 2017-02-02 (×4): qty 0.16

## 2017-02-02 MED ORDER — METOCLOPRAMIDE HCL 10 MG PO TABS
10.0000 mg | ORAL_TABLET | Freq: Three times a day (TID) | ORAL | Status: DC
  Administered 2017-02-02 – 2017-02-03 (×5): 10 mg via ORAL
  Filled 2017-02-02 (×5): qty 1

## 2017-02-02 MED ORDER — CYANOCOBALAMIN 1000 MCG/ML IJ SOLN
1000.0000 ug | Freq: Every day | INTRAMUSCULAR | Status: DC
  Administered 2017-02-02 – 2017-02-03 (×2): 1000 ug via INTRAMUSCULAR
  Filled 2017-02-02 (×2): qty 1

## 2017-02-02 NOTE — Progress Notes (Addendum)
Hypoglycemic Event  CBG:61 at 0752  Treatment: 15 GM carbohydrate snack and breakfast  Symptoms: Sweaty  Follow-up CBG: Time:1005 CBG Result:175  Possible Reasons for Event: Inadequate meal intake and Medication regimen: Levimir  Comments/MD notified [place] [facility], new orders    Norva Pavlov

## 2017-02-02 NOTE — Progress Notes (Signed)
PROGRESS NOTE    [name]  NOM:[id number] DOB: 10/07/1965 DOA: 01/31/2017 PCP: [name], MD    Brief Narrative:  51 yo F with PMH of T1DM, gastroparesis, HTN, anxiety/depression presenting after 2 days of nausea and vomiting and stopping her short acting insulin (notes she continued the basal) who presented with nausea, vomiting and DKA.   patient placed on the glucose stabilizer and subsequently transitioned to subcutaneous insulin. Insulin being adjusted for better blood glucose control.    Assessment & Plan:   Principal Problem:   Diabetic ketoacidosis without coma associated with type 1 diabetes mellitus (HCC) Active Problems:   Nausea & vomiting   Diabetic neuropathy, type I diabetes mellitus (HCC)   Anemia   Anxiety and depression   Diabetic gastroparesis (HCC)   AKI (acute kidney injury) ([place])   HLD (hyperlipidemia)   GERD (gastroesophageal reflux disease)   Essential hypertension   Leukocytosis   DKA (diabetic ketoacidoses) ([place])  #1 diabetic ketoacidosis Questionable etiology. No signs or symptoms of infection. Patient was on glucose stabilizer and subsequently been transitioned to subcutaneous insulin of Levemir 18 units twice a day. Patient's home dose was Levemir 25 units twice a day. Patient tolerating current oral intake. CBGs have ranged from 61-200. Decrease Levemir to 16 units twice daily. Continue sliding scale insulin. Change Reglan to before meals and at bedtime. Follow.  #2 diabetic gastroparesis Change Reglan to before meals and at bedtime.  #3 nausea vomiting Likely secondary to problem #2. Improved.  #4 acute kidney injury Likely secondary to prerenal azotemia. Improved with hydration. Follow.  #5 low vitamin B-12 levels.  Place on vitamin B-12 supplementation.  #6 depression/anxiety Patient with ongoing stresses at home. Continue BuSpar and Prozac.  #7 gastroesophageal reflux disease Pepcid.  #8 hyperlipidemia Continue  Lipitor.  #9 leukocytosis Likely reactive leukocytosis. Resolved.    DVT prophylaxis: Lovenox Code Status: Full Family Communication: Updated patient. No family at bedside. Disposition Plan: Home once CBGs have improved.   Consultants:   None  Procedures:   Chest x-ray 01/31/2017    Antimicrobials:   None   Subjective: Patient states somewhat depressed from stressors at home. No chest pain. No shortness of breath. No vomiting. Some nausea. Tolerating oral intake. Patient noted to have a CBG of 61 this morning.  Objective: Vitals:   02/01/17 1600 02/01/17 1730 02/01/17 2005 02/02/17 0449  BP:  139/68 (!) 92/44 (!) 113/57  Pulse:   85 83  Resp:  16 14 16   Temp: 97.8 F (36.6 C) 98.5 F (36.9 C) 98.2 F (36.8 C) 98.2 F (36.8 C)  TempSrc: Oral Oral Oral Oral  SpO2:  100% 100% 100%  Weight:      Height:        Intake/Output Summary (Last 24 hours) at 02/02/17 1244 Last data filed at 02/02/17 0449  Gross per 24 hour  Intake           470.09 ml  Output              300 ml  Net           170.09 ml   Filed Weights   02/01/17 1200  Weight: 58.1 kg (128 lb 1.4 oz)    Examination:  General exam: Appears calm and comfortable  Respiratory system: Clear to auscultation. Respiratory effort normal. Cardiovascular system: S1 & S2 heard, RRR. No JVD, murmurs, rubs, gallops or clicks. No pedal edema. Gastrointestinal system: Abdomen is nondistended, soft and nontender. No organomegaly or masses  felt. Normal bowel sounds heard. Central nervous system: Alert and oriented. No focal neurological deficits. Extremities: Symmetric 5 x 5 power. Skin: No rashes, lesions or ulcers Psychiatry: Judgement and insight appear Fair. Mood & affect Flat.     Data Reviewed: I have personally reviewed following labs and imaging studies  CBC:  Recent Labs Lab 01/31/17 1334 02/01/17 1431 02/02/17 0900  WBC 18.4* 19.6* 9.1  NEUTROABS 16.9*  --  6.1  HGB 9.3* 9.4* 8.0*  HCT  30.7* 31.0* 25.7*  MCV 72.4* 74.0* 73.4*  PLT 412* 414* 263   Basic Metabolic Panel:  Recent Labs Lab 01/31/17 1334 02/01/17 0258 02/01/17 1431 02/02/17 0344  NA 138 149* 141 139  K 4.6 3.8 3.9 4.0  CL 108 120* 115* 113*  CO2 10* 18* 17* 18*  GLUCOSE 579* 134* 171* 157*  BUN 26* 24* 20 17  CREATININE 1.40* 1.27* 1.08* 1.07*  CALCIUM 9.0 9.0 8.5* 8.2*   GFR: Estimated Creatinine Clearance: 50.3 mL/min (A) (by C-G formula based on SCr of 1.07 mg/dL (H)). Liver Function Tests:  Recent Labs Lab 01/31/17 1334  AST 18  ALT 12*  ALKPHOS 80  BILITOT 1.7*  PROT 7.7  ALBUMIN 4.0    Recent Labs Lab 01/31/17 1334  LIPASE 20   No results for input(s): AMMONIA in the last 168 hours. Coagulation Profile: No results for input(s): INR, PROTIME in the last 168 hours. Cardiac Enzymes:  Recent Labs Lab 01/31/17 1334  TROPONINI <0.03   BNP (last 3 results) No results for input(s): PROBNP in the last 8760 hours. HbA1C: No results for input(s): HGBA1C in the last 72 hours. CBG:  Recent Labs Lab 02/01/17 1716 02/01/17 2005 02/02/17 0752 02/02/17 1005 02/02/17 1211  GLUCAP 150* 200* 61* 175* 123*   Lipid Profile: No results for input(s): CHOL, HDL, LDLCALC, TRIG, CHOLHDL, LDLDIRECT in the last 72 hours. Thyroid Function Tests: No results for input(s): TSH, T4TOTAL, FREET4, T3FREE, THYROIDAB in the last 72 hours. Anemia Panel:  Recent Labs  02/02/17 0900  RETICCTPCT 0.9   Sepsis Labs: No results for input(s): PROCALCITON, LATICACIDVEN in the last 168 hours.  Recent Results (from the past 240 hour(s))  MRSA PCR Screening     Status: None   Collection Time: 01/31/17  8:52 PM  Result Value Ref Range Status   MRSA by PCR NEGATIVE NEGATIVE Final    Comment:        The GeneXpert MRSA Assay (FDA approved for NASAL specimens only), is one component of a comprehensive MRSA colonization surveillance program. It is not intended to diagnose MRSA infection nor to  guide or monitor treatment for MRSA infections.          Radiology Studies: Ir Fluoro Guide Cv Line Right  Result Date: 02/01/2017 INDICATION: Poor venous access. In need of durable intravenous access for medication administration and blood draws. EXAM: ULTRASOUND AND FLUOROSCOPIC GUIDED PICC LINE INSERTION MEDICATIONS: None. CONTRAST:  None FLUOROSCOPY TIME:  6 seconds (1 mGy) COMPLICATIONS: None immediate. TECHNIQUE: The procedure, risks, benefits, and alternatives were explained to the patient and informed written consent was obtained. A timeout was performed prior to the initiation of the procedure. The right upper extremity was prepped with chlorhexidine in a sterile fashion, and a sterile drape was applied covering the operative field. Maximum barrier sterile technique with sterile gowns and gloves were used for the procedure. A timeout was performed prior to the initiation of the procedure. Local anesthesia was provided with 1% lidocaine. Under direct  ultrasound guidance, the brachial vein was accessed with a micropuncture kit after the overlying soft tissues were anesthetized with 1% lidocaine. After the overlying soft tissues were anesthetized, a small venotomy incision was created and a micropuncture kit was utilized to access the right brachial vein. Real-time ultrasound guidance was utilized for vascular access including the acquisition of a permanent ultrasound image documenting patency of the accessed vessel. A guidewire was advanced to the level of the superior caval-atrial junction for measurement purposes and the PICC line was cut to length. A peel-away sheath was placed and a 31 cm, 5 [place], dual lumen was inserted to level of the superior caval-atrial junction. A post procedure spot fluoroscopic was obtained. The catheter easily aspirated and flushed and was sutured in place. A dressing was placed. The patient tolerated the procedure well without immediate post procedural  complication. FINDINGS: After catheter placement, the tip lies within the superior cavoatrial junction. The catheter aspirates and flushes normally and is ready for immediate use. IMPRESSION: Successful ultrasound and fluoroscopic guided placement of a right brachial vein approach, 31 cm, 5 French, dual lumen PICC with tip at the superior caval-atrial junction. The PICC line is ready for immediate use. Electronically Signed   By: Sandi Mariscal M.D.   On: 02/01/2017 17:10   Ir US Guide Vasc Access Right  Result Date: 02/01/2017 INDICATION: Poor venous access. In need of durable intravenous access for medication administration and blood draws. EXAM: ULTRASOUND AND FLUOROSCOPIC GUIDED PICC LINE INSERTION MEDICATIONS: None. CONTRAST:  None FLUOROSCOPY TIME:  6 seconds (1 mGy) COMPLICATIONS: None immediate. TECHNIQUE: The procedure, risks, benefits, and alternatives were explained to the patient and informed written consent was obtained. A timeout was performed prior to the initiation of the procedure. The right upper extremity was prepped with chlorhexidine in a sterile fashion, and a sterile drape was applied covering the operative field. Maximum barrier sterile technique with sterile gowns and gloves were used for the procedure. A timeout was performed prior to the initiation of the procedure. Local anesthesia was provided with 1% lidocaine. Under direct ultrasound guidance, the brachial vein was accessed with a micropuncture kit after the overlying soft tissues were anesthetized with 1% lidocaine. After the overlying soft tissues were anesthetized, a small venotomy incision was created and a micropuncture kit was utilized to access the right brachial vein. Real-time ultrasound guidance was utilized for vascular access including the acquisition of a permanent ultrasound image documenting patency of the accessed vessel. A guidewire was advanced to the level of the superior caval-atrial junction for measurement  purposes and the PICC line was cut to length. A peel-away sheath was placed and a 31 cm, 5 [place], dual lumen was inserted to level of the superior caval-atrial junction. A post procedure spot fluoroscopic was obtained. The catheter easily aspirated and flushed and was sutured in place. A dressing was placed. The patient tolerated the procedure well without immediate post procedural complication. FINDINGS: After catheter placement, the tip lies within the superior cavoatrial junction. The catheter aspirates and flushes normally and is ready for immediate use. IMPRESSION: Successful ultrasound and fluoroscopic guided placement of a right brachial vein approach, 31 cm, 5 French, dual lumen PICC with tip at the superior caval-atrial junction. The PICC line is ready for immediate use. Electronically Signed   By: Sandi Mariscal M.D.   On: 02/01/2017 17:10   Dg Chest Portable 1 View  Result Date: 01/31/2017 CLINICAL DATA:  DKA. EXAM: PORTABLE CHEST 1 VIEW COMPARISON:  03/22/2016 FINDINGS:  The cardiomediastinal silhouette is unremarkable. There is no evidence of focal airspace disease, pulmonary edema, suspicious pulmonary nodule/mass, pleural effusion, or pneumothorax. No acute bony abnormalities are identified. IMPRESSION: No active disease. Electronically Signed   By: Margarette Canada M.D.   On: 01/31/2017 17:58        Scheduled Meds: . atorvastatin  40 mg Oral Daily  . busPIRone  10 mg Oral BID  . enoxaparin (LOVENOX) injection  40 mg Subcutaneous QHS  . famotidine  20 mg Oral BID  . FLUoxetine  40 mg Oral Daily  . insulin aspart  0-5 Units Subcutaneous QHS  . insulin aspart  0-9 Units Subcutaneous TID WC  . insulin detemir  16 Units Subcutaneous BID  . metoCLOPramide  10 mg Oral TID AC & HS  . pregabalin  150 mg Oral BID   Continuous Infusions:   LOS: 1 day    Time spent: 44 minutes    Kadyn Guild, MD [facility] Pager [phone]  If 7PM-7AM, please contact  night-coverage [email or web address] Password [facility] 02/02/2017, 12:44 PM

## 2017-02-03 LAB — BASIC METABOLIC PANEL
ANION GAP: 8 (ref 5–15)
BUN: 12 mg/dL (ref 6–20)
CALCIUM: 8.3 mg/dL — AB (ref 8.9–10.3)
CO2: 22 mmol/L (ref 22–32)
Chloride: 109 mmol/L (ref 101–111)
Creatinine, Ser: 0.95 mg/dL (ref 0.44–1.00)
GFR calc Af Amer: >60 mL/min (ref >60)
GLUCOSE: 189 mg/dL — AB (ref 65–99)
Potassium: 4.2 mmol/L (ref 3.5–5.1)
SODIUM: 139 mmol/L (ref 135–145)

## 2017-02-03 LAB — HEMOGLOBIN AND HEMATOCRIT, BLOOD
HCT: 27.9 % — ABNORMAL LOW (ref 36.0–46.0)
Hemoglobin: 8.7 g/dL — ABNORMAL LOW (ref 12.0–15.0)

## 2017-02-03 LAB — GLUCOSE, CAPILLARY
GLUCOSE-CAPILLARY: 159 mg/dL — AB (ref 65–99)
Glucose-Capillary: 303 mg/dL — ABNORMAL HIGH (ref 65–99)

## 2017-02-03 MED ORDER — VITAMIN B-12 1000 MCG PO TABS: 1000.0000 ug | ORAL_TABLET | Freq: Every day | ORAL | Status: DC

## 2017-02-03 MED ORDER — INSULIN DETEMIR 100 UNIT/ML ~~LOC~~ SOLN: 16.0000 [IU] | Freq: Two times a day (BID) | SUBCUTANEOUS | 0 refills | Status: DC

## 2017-02-03 MED ORDER — METOCLOPRAMIDE HCL 10 MG PO TABS: 10.0000 mg | ORAL_TABLET | Freq: Three times a day (TID) | ORAL | 0 refills | Status: DC

## 2017-02-03 MED ORDER — FERROUS SULFATE 325 (65 FE) MG PO TABS
325.0000 mg | ORAL_TABLET | Freq: Three times a day (TID) | ORAL | Status: DC
Start: 2017-02-03 — End: 2017-02-03
  Administered 2017-02-03: 325 mg via ORAL
  Filled 2017-02-03: qty 1

## 2017-02-03 MED ORDER — METOCLOPRAMIDE HCL 5 MG/5ML PO SOLN: 10.0000 mg | Freq: Three times a day (TID) | ORAL | 0 refills | Status: DC

## 2017-02-03 NOTE — Discharge Summary (Addendum)
Physician Discharge Summary  [name] JQZ:[id number] DOB: 1966-02-05 DOA: 01/31/2017  PCP: [name], MD  Admit date: 01/31/2017 Discharge date: 02/03/2017  Time spent: 60 minutes  Recommendations for Outpatient Follow-up:  1. Follow-up with [name], MD 1-2 weeks. On follow-up patient's diabetes will need to be reassessed and followed up upon. Patient also noted to have low vitamin B-12 levels which would need to be followed up upon.   Discharge Diagnoses:  Principal Problem:   Diabetic ketoacidosis without coma associated with type 1 diabetes mellitus ([place]) Active Problems:   Nausea & vomiting   Diabetic neuropathy, type I diabetes mellitus (HCC)   Anemia   Anxiety and depression   Diabetic gastroparesis (HCC)   AKI (acute kidney injury) ([place])   HLD (hyperlipidemia)   GERD (gastroesophageal reflux disease)   Essential hypertension   Leukocytosis   DKA (diabetic ketoacidoses) ([place])   Discharge Condition: Stable and improved.  Diet recommendation: Carb modified diet.  Filed Weights   02/01/17 1200  Weight: 58.1 kg (128 lb 1.4 oz)    History of present illness:  Per Dr. Lynden [name] is an 51 y.o. female with past medical history significant for diabetes, diabetic gastroparesis, hypertension who has had multiple episodes of nausea and vomiting secondary due to poorly controlled gastroparesis. Patient stated she was in her usual state of health until 2 days ago when she started having nausea and vomiting again. She stopped taking her short-acting insulin at that time because she was not being able to eat anything however she stated she did continue her basal insulin. She was seen by her PCP and got IM injection of Phenergan which was not very effective. She came in for management of nausea and vomiting.  Patient's main concern right was anxiety. She stated she gets panic attacks when she gets sick. She was requesting more IV Ativan. She did feel her  nausea was somewhat improved but not resolved with treatment with Reglan.  ED Course:  Patient was treated with IV fluids and Reglan. Workup revealed a bicarbonate of 10 with a gap of 20. She was thought to be in DKA due to elevated blood sugars of 579. She was put on DKA protocol and was admitted for further management.   Hospital Course:  #1 diabetic ketoacidosis Questionable etiology. No signs or symptoms of infection. Likely secondary due to noncompliance. Patient was on glucose stabilizer and subsequently been transitioned to subcutaneous insulin of Levemir 18 units twice a day. Patient's home dose was Levemir 25 units twice a day. Patient was noted to have CBG of 61. Patient's Levemir dose was decreased to 16 units twice daily with improved CBGs which ranged from 123-159. Patient was also maintained on Reglan before meals and at bedtime. Patient will be discharged home on Levemir 16 units twice daily. Outpatient follow-up.  #2 diabetic gastroparesis Patient noted to have a history of diabetic gastroparesis. While patient was on the DKA protocol patient was maintained on IV Reglan every 6 hours. Once patient improved clinically and was tolerating a diet was subsequently transitioned to oral Reglan 10 mg before meals and at bedtime. Outpatient follow-up.   #3 nausea vomiting Likely secondary to problem #2. Improved.  #4 acute kidney injury Likely secondary to prerenal azotemia. Resolved with hydration.   #5 low vitamin B-12 levels.  Patient was noted to have low vitamin B-12 level of 252. Patient was placed on IM vitamin B-12 during this hospitalization and will be discharged on oral vitamin B-12 supplementation. Outpatient  follow-up.  #6 depression/anxiety Patient with ongoing stresses at home. Continued on home regimen of BuSpar and Prozac.  #7 gastroesophageal reflux disease Patient was maintained on Pepcid.  #8 hyperlipidemia Patient was maintained on home regimen of  Lipitor.  #9 leukocytosis Likely reactive leukocytosis. Resolved.   Procedures:  Chest x-ray 01/31/2017  Consultations:  None  Discharge Exam: Vitals:   02/02/17 2144 02/03/17 0608  BP: 130/74 131/71  Pulse: 81 87  Resp: 18 16  Temp: 98.2 F (36.8 C) 98.3 F (36.8 C)  SpO2: 100% 100%    General: NAD Cardiovascular: RRR Respiratory: CTAB  Discharge Instructions   Discharge Instructions    Diet Carb Modified    Complete by:  As directed    Increase activity slowly    Complete by:  As directed      Current Discharge Medication List    START taking these medications   Details  metoCLOPramide (REGLAN) 5 MG/5ML solution Take 10 mLs (10 mg total) by mouth 4 (four) times daily -  before meals and at bedtime. Qty: 1200 mL, Refills: 0    vitamin B-12 (CYANOCOBALAMIN) 1000 MCG tablet Take 1 tablet (1,000 mcg total) by mouth daily.      CONTINUE these medications which have CHANGED   Details  insulin detemir (LEVEMIR) 100 UNIT/ML injection Inject 0.16 mLs (16 Units total) into the skin 2 (two) times daily. Qty: 30 mL, Refills: 0   Associated Diagnoses: Diabetic ketoacidosis without coma associated with type 1 diabetes mellitus ([place])      CONTINUE these medications which have NOT CHANGED   Details  albuterol (PROVENTIL HFA;VENTOLIN HFA) 108 (90 Base) MCG/ACT inhaler Inhale 2 puffs into the lungs every 6 (six) hours as needed for wheezing or shortness of breath. Qty: 1 Inhaler, Refills: 0   Associated Diagnoses: Seasonal allergies    atorvastatin (LIPITOR) 40 MG tablet Take 1 tablet (40 mg total) by mouth daily. Qty: 30 tablet, Refills: 3   Associated Diagnoses: Diabetic ketoacidosis without coma associated with type 1 diabetes mellitus (HCC)    Blood Glucose Monitoring Suppl (ACCU-CHEK AVIVA) device Use as instructed 3 times daily before meals Qty: 1 each, Refills: 0   Associated Diagnoses: Type 1 diabetes mellitus with ketoacidosis without coma (HCC)     busPIRone (BUSPAR) 10 MG tablet Take 1 tablet (10 mg total) by mouth 2 (two) times daily. Qty: 60 tablet, Refills: 3   Associated Diagnoses: Anxiety and depression    famotidine (PEPCID) 20 MG tablet Take 1 tablet (20 mg total) by mouth 2 (two) times daily. Qty: 60 tablet, Refills: 0    ferrous sulfate 325 (65 FE) MG tablet Take 1 tablet (325 mg total) by mouth daily with breakfast. Qty: 30 tablet, Refills: 0    FLUoxetine (PROZAC) 20 MG tablet Take 2 tablets (40 mg total) by mouth daily. Qty: 60 tablet, Refills: 3   Associated Diagnoses: Anxiety and depression    furosemide (LASIX) 40 MG tablet Take 40 mg by mouth daily as needed for fluid.     glucose blood (ACCU-CHEK AVIVA) test strip Use as instructed 3 times daily before meals Qty: 100 each, Refills: 12   Associated Diagnoses: Type 1 diabetes mellitus with ketoacidosis without coma (HCC)    insulin aspart (NOVOLOG) 100 UNIT/ML injection Inject 0-12 Units into the skin 3 (three) times daily with meals. Per sliding scale Qty: 50 mL, Refills: 3   Associated Diagnoses: Diabetic ketoacidosis without coma associated with type 1 diabetes mellitus ([place])  Insulin Syringe-Needle U-100 (BD INSULIN SYRINGE ULTRAFINE) 31G X 15/64" 0.5 ML MISC 1 each by Does not apply route 4 (four) times daily as needed. Qty: 120 each, Refills: 5   Associated Diagnoses: Type 1 diabetes mellitus with hyperglycemia (HCC)    Lancet Devices (ACCU-CHEK SOFTCLIX) lancets Use as instructed 3 times daily before meals Qty: 1 each, Refills: 5   Associated Diagnoses: Type 1 diabetes mellitus with ketoacidosis without coma (HCC)    ondansetron (ZOFRAN ODT) 4 MG disintegrating tablet Take 1 tablet (4 mg total) by mouth every 8 (eight) hours as needed for nausea or vomiting. Qty: 60 tablet, Refills: 2   Associated Diagnoses: Diabetic gastroparesis ([place])    polyethylene glycol (MIRALAX / GLYCOLAX) packet Take 17 g by mouth daily as needed for mild  constipation. Qty: 100 each, Refills: 0   Associated Diagnoses: Other constipation    pregabalin (LYRICA) 150 MG capsule Take 1 capsule (150 mg total) by mouth 2 (two) times daily. Qty: 60 capsule, Refills: 3   Associated Diagnoses: Diabetic neuropathy, type I diabetes mellitus (HCC)    LORazepam (ATIVAN) 1 MG tablet Take 1 tablet (1 mg total) by mouth every 8 (eight) hours as needed for anxiety. Qty: 8 tablet, Refills: 0    promethazine (PHENERGAN) 25 MG tablet Take 1 tablet (25 mg total) by mouth every 6 (six) hours as needed for nausea. Qty: 30 tablet, Refills: 0      STOP taking these medications     fluconazole (DIFLUCAN) 150 MG tablet      metoCLOPramide (REGLAN) 10 MG tablet      meloxicam (MOBIC) 15 MG tablet      norethindrone (ORTHO MICRONOR) 0.35 MG tablet        Allergies  Allergen Reactions  . Anesthetics, Amide Nausea And Vomiting  . Penicillins Diarrhea and Nausea And Vomiting    Has patient had a PCN reaction causing immediate rash, facial/tongue/throat swelling, SOB or lightheadedness with hypotension: No Has patient had a PCN reaction causing severe rash involving mucus membranes or skin necrosis: No Has patient had a PCN reaction that required hospitalization No Has patient had a PCN reaction occurring within the last 10 years: Yes  If all of the above answers are "NO", then may proceed with Cephalosporin use.   . Buprenorphine Hcl Rash  . Encainide Nausea And Vomiting   Follow-up Information    [name], MD. Schedule an appointment as soon as possible for a visit in 1 week(s).   Specialty:  Family Medicine Why:  F/U IN 1-2 WEEKS. Contact information: [place] [address] [place], [place] Follow up.   Specialty:  [place] Why:  local number [phone] Contact information: [place] [address] [phone]            The results  of significant diagnostics from this hospitalization (including imaging, microbiology, ancillary and laboratory) are listed below for reference.    Significant Diagnostic Studies: Ir Fluoro Guide Cv Line Right  Result Date: 02/01/2017 INDICATION: Poor venous access. In need of durable intravenous access for medication administration and blood draws. EXAM: ULTRASOUND AND FLUOROSCOPIC GUIDED PICC LINE INSERTION MEDICATIONS: None. CONTRAST:  None FLUOROSCOPY TIME:  6 seconds (1 mGy) COMPLICATIONS: None immediate. TECHNIQUE: The procedure, risks, benefits, and alternatives were explained to the patient and informed written consent was obtained. A timeout was performed prior to the initiation of  the procedure. The right upper extremity was prepped with chlorhexidine in a sterile fashion, and a sterile drape was applied covering the operative field. Maximum barrier sterile technique with sterile gowns and gloves were used for the procedure. A timeout was performed prior to the initiation of the procedure. Local anesthesia was provided with 1% lidocaine. Under direct ultrasound guidance, the brachial vein was accessed with a micropuncture kit after the overlying soft tissues were anesthetized with 1% lidocaine. After the overlying soft tissues were anesthetized, a small venotomy incision was created and a micropuncture kit was utilized to access the right brachial vein. Real-time ultrasound guidance was utilized for vascular access including the acquisition of a permanent ultrasound image documenting patency of the accessed vessel. A guidewire was advanced to the level of the superior caval-atrial junction for measurement purposes and the PICC line was cut to length. A peel-away sheath was placed and a 31 cm, 5 [place], dual lumen was inserted to level of the superior caval-atrial junction. A post procedure spot fluoroscopic was obtained. The catheter easily aspirated and flushed and was sutured in place. A dressing  was placed. The patient tolerated the procedure well without immediate post procedural complication. FINDINGS: After catheter placement, the tip lies within the superior cavoatrial junction. The catheter aspirates and flushes normally and is ready for immediate use. IMPRESSION: Successful ultrasound and fluoroscopic guided placement of a right brachial vein approach, 31 cm, 5 [place], dual lumen PICC with tip at the superior caval-atrial junction. The PICC line is ready for immediate use. Electronically Signed   By: Sandi Mariscal M.D.   On: 02/01/2017 17:10   Ir US Guide Vasc Access Right  Result Date: 02/01/2017 INDICATION: Poor venous access. In need of durable intravenous access for medication administration and blood draws. EXAM: ULTRASOUND AND FLUOROSCOPIC GUIDED PICC LINE INSERTION MEDICATIONS: None. CONTRAST:  None FLUOROSCOPY TIME:  6 seconds (1 mGy) COMPLICATIONS: None immediate. TECHNIQUE: The procedure, risks, benefits, and alternatives were explained to the patient and informed written consent was obtained. A timeout was performed prior to the initiation of the procedure. The right upper extremity was prepped with chlorhexidine in a sterile fashion, and a sterile drape was applied covering the operative field. Maximum barrier sterile technique with sterile gowns and gloves were used for the procedure. A timeout was performed prior to the initiation of the procedure. Local anesthesia was provided with 1% lidocaine. Under direct ultrasound guidance, the brachial vein was accessed with a micropuncture kit after the overlying soft tissues were anesthetized with 1% lidocaine. After the overlying soft tissues were anesthetized, a small venotomy incision was created and a micropuncture kit was utilized to access the right brachial vein. Real-time ultrasound guidance was utilized for vascular access including the acquisition of a permanent ultrasound image documenting patency of the accessed vessel. A guidewire  was advanced to the level of the superior caval-atrial junction for measurement purposes and the PICC line was cut to length. A peel-away sheath was placed and a 31 cm, 5 [place], dual lumen was inserted to level of the superior caval-atrial junction. A post procedure spot fluoroscopic was obtained. The catheter easily aspirated and flushed and was sutured in place. A dressing was placed. The patient tolerated the procedure well without immediate post procedural complication. FINDINGS: After catheter placement, the tip lies within the superior cavoatrial junction. The catheter aspirates and flushes normally and is ready for immediate use. IMPRESSION: Successful ultrasound and fluoroscopic guided placement of a right brachial vein approach, 31 cm,  5 [place], dual lumen PICC with tip at the superior caval-atrial junction. The PICC line is ready for immediate use. Electronically Signed   By: Sandi Mariscal M.D.   On: 02/01/2017 17:10   Dg Chest Portable 1 View  Result Date: 01/31/2017 CLINICAL DATA:  DKA. EXAM: PORTABLE CHEST 1 VIEW COMPARISON:  03/22/2016 FINDINGS: The cardiomediastinal silhouette is unremarkable. There is no evidence of focal airspace disease, pulmonary edema, suspicious pulmonary nodule/mass, pleural effusion, or pneumothorax. No acute bony abnormalities are identified. IMPRESSION: No active disease. Electronically Signed   By: Margarette Canada M.D.   On: 01/31/2017 17:58    Microbiology: Recent Results (from the past 240 hour(s))  MRSA PCR Screening     Status: None   Collection Time: 01/31/17  8:52 PM  Result Value Ref Range Status   MRSA by PCR NEGATIVE NEGATIVE Final    Comment:        The GeneXpert MRSA Assay (FDA approved for NASAL specimens only), is one component of a comprehensive MRSA colonization surveillance program. It is not intended to diagnose MRSA infection nor to guide or monitor treatment for MRSA infections.      Labs: Basic Metabolic Panel:  Recent Labs Lab  01/31/17 1334 02/01/17 0258 02/01/17 1431 02/02/17 0344 02/03/17 0500  NA 138 149* 141 139 139  K 4.6 3.8 3.9 4.0 4.2  CL 108 120* 115* 113* 109  CO2 10* 18* 17* 18* 22  GLUCOSE 579* 134* 171* 157* 189*  BUN 26* 24* 20 17 12   CREATININE 1.40* 1.27* 1.08* 1.07* 0.95  CALCIUM 9.0 9.0 8.5* 8.2* 8.3*   Liver Function Tests:  Recent Labs Lab 01/31/17 1334  AST 18  ALT 12*  ALKPHOS 80  BILITOT 1.7*  PROT 7.7  ALBUMIN 4.0    Recent Labs Lab 01/31/17 1334  LIPASE 20   No results for input(s): AMMONIA in the last 168 hours. CBC:  Recent Labs Lab 01/31/17 1334 02/01/17 1431 02/02/17 0900 02/03/17 0830  WBC 18.4* 19.6* 9.1  --   NEUTROABS 16.9*  --  6.1  --   HGB 9.3* 9.4* 8.0* 8.7*  HCT 30.7* 31.0* 25.7* 27.9*  MCV 72.4* 74.0* 73.4*  --   PLT 412* 414* 325  --    Cardiac Enzymes:  Recent Labs Lab 01/31/17 1334  TROPONINI <0.03   BNP: BNP (last 3 results) No results for input(s): BNP in the last 8760 hours.  ProBNP (last 3 results) No results for input(s): PROBNP in the last 8760 hours.  CBG:  Recent Labs Lab 02/02/17 1211 02/02/17 1717 02/02/17 2153 02/03/17 0808 02/03/17 1233  GLUCAP 123* 141* 135* 159* 303*       Signed:  THOMPSON,DANIEL MD.  [facility] 02/03/2017, 3:47 PM

## 2017-02-03 NOTE — Care Management Note (Signed)
Case Management Note  Patient Details  Name: [name] MRN: [id number] Date of Birth: [birth year]  Subjective/Objective:     DKA, nausea, vomiting, diabetic neuropathy               Action/Plan: Discharge Planning: NCM spoke to pt at bedside. Offered choice for HH/list provided. Pt agreeable to [facility] for [facility]. Contacted [employer] rep with new referral. Pt states she checks her blood sugar at home. And she has tried to get into clinical study for gastroparesis. Encouraged pt to speak to her PCP about clinial study. Has appt at [facility] on 02/07/2017.  PCP [name] MD  Expected Discharge Date:  02/03/17               Expected Discharge Plan:  [place]  In-House Referral:  NA  Discharge planning Services  CM Consult  Post Acute Care Choice:  Home Health Choice offered to:  Patient  DME Arranged:  N/A DME Agency:  NA  HH Arranged:  RN [place] Agency:  Other - See comment  Status of Service:  Completed, signed off  If discussed at [place] of Stay Meetings, dates discussed:    Additional Comments:  Erenest Rasher, RN 02/03/2017, 5:45 PM

## 2017-02-04 ENCOUNTER — Telehealth: Payer: Self-pay

## 2017-02-04 ENCOUNTER — Telehealth: Payer: Self-pay | Admitting: Family Medicine

## 2017-02-04 NOTE — Telephone Encounter (Signed)
A copy of patient's demographics, medication list along with notes from last visit was faxed to Dr. Marquis Buggy office, fax [phone].

## 2017-02-04 NOTE — Telephone Encounter (Signed)
Call received from Marcelino Scot, [place] reporting that she has not been able to reach the patient for 3 business days.  Informed her that the patient was in the hospital until yesterday and has an appointment with Dr Jarold Song at [facility] on 02/06/17 @ 1115. Will instruct the patient to call her Earlie Server) when she comes to the clinic if unable to reach her prior to that time

## 2017-02-04 NOTE — Telephone Encounter (Signed)
[facility] Post-discharge Follow-Up Phone Call:  Date of Discharge:  02/03/17 Principal Discharge Diagnosis(es):  DKA with type 1 diabetes.  Post-discharge Communication: (Clearly document all attempts clearly and date contact made) call placed to # [phone] (M) and a HIPAA compliant voicemail message was left requesting a call back to # [phone] Call Completed: No

## 2017-02-05 ENCOUNTER — Encounter (HOSPITAL_COMMUNITY): Payer: Self-pay | Admitting: Emergency Medicine

## 2017-02-05 ENCOUNTER — Observation Stay (HOSPITAL_COMMUNITY)
Admission: EM | Admit: 2017-02-05 | Discharge: 2017-02-06 | Disposition: A | Discharge status: Home / Self Care | Payer: Medicaid Other | Attending: Internal Medicine | Admitting: Internal Medicine

## 2017-02-05 ENCOUNTER — Telehealth: Payer: Self-pay | Admitting: Family Medicine

## 2017-02-05 ENCOUNTER — Observation Stay (HOSPITAL_COMMUNITY): Payer: Medicaid Other

## 2017-02-05 DIAGNOSIS — D509 Iron deficiency anemia, unspecified: Secondary | ICD-10-CM | POA: Insufficient documentation

## 2017-02-05 DIAGNOSIS — Z9119 Patient's noncompliance with other medical treatment and regimen: Secondary | ICD-10-CM | POA: Insufficient documentation

## 2017-02-05 DIAGNOSIS — Z79899 Other long term (current) drug therapy: Secondary | ICD-10-CM | POA: Insufficient documentation

## 2017-02-05 DIAGNOSIS — Z833 Family history of diabetes mellitus: Secondary | ICD-10-CM | POA: Diagnosis not present

## 2017-02-05 DIAGNOSIS — E1143 Type 2 diabetes mellitus with diabetic autonomic (poly)neuropathy: Secondary | ICD-10-CM | POA: Diagnosis present

## 2017-02-05 DIAGNOSIS — E1043 Type 1 diabetes mellitus with diabetic autonomic (poly)neuropathy: Principal | ICD-10-CM | POA: Insufficient documentation

## 2017-02-05 DIAGNOSIS — Z884 Allergy status to anesthetic agent status: Secondary | ICD-10-CM | POA: Diagnosis not present

## 2017-02-05 DIAGNOSIS — E538 Deficiency of other specified B group vitamins: Secondary | ICD-10-CM | POA: Insufficient documentation

## 2017-02-05 DIAGNOSIS — G43A0 Cyclical vomiting, not intractable: Secondary | ICD-10-CM | POA: Diagnosis not present

## 2017-02-05 DIAGNOSIS — E876 Hypokalemia: Secondary | ICD-10-CM | POA: Diagnosis not present

## 2017-02-05 DIAGNOSIS — Z8249 Family history of ischemic heart disease and other diseases of the circulatory system: Secondary | ICD-10-CM | POA: Diagnosis not present

## 2017-02-05 DIAGNOSIS — T68XXXA Hypothermia, initial encounter: Secondary | ICD-10-CM | POA: Diagnosis not present

## 2017-02-05 DIAGNOSIS — K219 Gastro-esophageal reflux disease without esophagitis: Secondary | ICD-10-CM | POA: Insufficient documentation

## 2017-02-05 DIAGNOSIS — I10 Essential (primary) hypertension: Secondary | ICD-10-CM | POA: Diagnosis not present

## 2017-02-05 DIAGNOSIS — Z794 Long term (current) use of insulin: Secondary | ICD-10-CM | POA: Insufficient documentation

## 2017-02-05 DIAGNOSIS — K3184 Gastroparesis: Secondary | ICD-10-CM | POA: Insufficient documentation

## 2017-02-05 DIAGNOSIS — N179 Acute kidney failure, unspecified: Secondary | ICD-10-CM | POA: Diagnosis not present

## 2017-02-05 DIAGNOSIS — F419 Anxiety disorder, unspecified: Secondary | ICD-10-CM | POA: Insufficient documentation

## 2017-02-05 DIAGNOSIS — F329 Major depressive disorder, single episode, unspecified: Secondary | ICD-10-CM | POA: Insufficient documentation

## 2017-02-05 DIAGNOSIS — X58XXXA Exposure to other specified factors, initial encounter: Secondary | ICD-10-CM | POA: Insufficient documentation

## 2017-02-05 DIAGNOSIS — Z9114 Patient's other noncompliance with medication regimen: Secondary | ICD-10-CM | POA: Diagnosis not present

## 2017-02-05 DIAGNOSIS — Z91199 Patient's noncompliance with other medical treatment and regimen due to unspecified reason: Secondary | ICD-10-CM

## 2017-02-05 DIAGNOSIS — E1069 Type 1 diabetes mellitus with other specified complication: Secondary | ICD-10-CM

## 2017-02-05 DIAGNOSIS — E871 Hypo-osmolality and hyponatremia: Secondary | ICD-10-CM | POA: Insufficient documentation

## 2017-02-05 DIAGNOSIS — Z836 Family history of other diseases of the respiratory system: Secondary | ICD-10-CM | POA: Diagnosis not present

## 2017-02-05 DIAGNOSIS — Z9111 Patient's noncompliance with dietary regimen: Secondary | ICD-10-CM

## 2017-02-05 DIAGNOSIS — Z9049 Acquired absence of other specified parts of digestive tract: Secondary | ICD-10-CM | POA: Insufficient documentation

## 2017-02-05 DIAGNOSIS — E785 Hyperlipidemia, unspecified: Secondary | ICD-10-CM | POA: Diagnosis not present

## 2017-02-05 DIAGNOSIS — E1010 Type 1 diabetes mellitus with ketoacidosis without coma: Secondary | ICD-10-CM | POA: Insufficient documentation

## 2017-02-05 DIAGNOSIS — Z880 Allergy status to penicillin: Secondary | ICD-10-CM | POA: Diagnosis not present

## 2017-02-05 DIAGNOSIS — R112 Nausea with vomiting, unspecified: Secondary | ICD-10-CM | POA: Diagnosis present

## 2017-02-05 DIAGNOSIS — D473 Essential (hemorrhagic) thrombocythemia: Secondary | ICD-10-CM | POA: Insufficient documentation

## 2017-02-05 DIAGNOSIS — Z888 Allergy status to other drugs, medicaments and biological substances status: Secondary | ICD-10-CM | POA: Insufficient documentation

## 2017-02-05 DIAGNOSIS — R109 Unspecified abdominal pain: Secondary | ICD-10-CM

## 2017-02-05 DIAGNOSIS — E1040 Type 1 diabetes mellitus with diabetic neuropathy, unspecified: Secondary | ICD-10-CM | POA: Diagnosis present

## 2017-02-05 DIAGNOSIS — G43A1 Cyclical vomiting, intractable: Secondary | ICD-10-CM | POA: Diagnosis not present

## 2017-02-05 LAB — COMPREHENSIVE METABOLIC PANEL
ALBUMIN: 4.4 g/dL (ref 3.5–5.0)
ALT: 22 U/L (ref 14–54)
ANION GAP: 20 — AB (ref 5–15)
AST: 32 U/L (ref 15–41)
Alkaline Phosphatase: 96 U/L (ref 38–126)
BILIRUBIN TOTAL: 1 mg/dL (ref 0.3–1.2)
BUN: 14 mg/dL (ref 6–20)
CO2: 19 mmol/L — ABNORMAL LOW (ref 22–32)
Calcium: 9.9 mg/dL (ref 8.9–10.3)
Chloride: 98 mmol/L — ABNORMAL LOW (ref 101–111)
Creatinine, Ser: 1.07 mg/dL — ABNORMAL HIGH (ref 0.44–1.00)
GFR calc non Af Amer: 60 mL/min — ABNORMAL LOW (ref >60)
GLUCOSE: 350 mg/dL — AB (ref 65–99)
POTASSIUM: 3.3 mmol/L — AB (ref 3.5–5.1)
Sodium: 137 mmol/L (ref 135–145)
TOTAL PROTEIN: 8.6 g/dL — AB (ref 6.5–8.1)

## 2017-02-05 LAB — CBC WITH DIFFERENTIAL/PLATELET
BASOS PCT: 0 %
Basophils Absolute: 0 10*3/uL (ref 0.0–0.1)
Eosinophils Absolute: 0 10*3/uL (ref 0.0–0.7)
Eosinophils Relative: 0 %
HEMATOCRIT: 33.4 % — AB (ref 36.0–46.0)
HEMOGLOBIN: 10.5 g/dL — AB (ref 12.0–15.0)
Lymphocytes Relative: 7 %
Lymphs Abs: 0.6 10*3/uL — ABNORMAL LOW (ref 0.7–4.0)
MCH: 22.5 pg — ABNORMAL LOW (ref 26.0–34.0)
MCHC: 31.4 g/dL (ref 30.0–36.0)
MCV: 71.7 fL — ABNORMAL LOW (ref 78.0–100.0)
MONOS PCT: 4 %
Monocytes Absolute: 0.3 10*3/uL (ref 0.1–1.0)
NEUTROS ABS: 7.5 10*3/uL (ref 1.7–7.7)
NEUTROS PCT: 89 %
Platelets: 442 10*3/uL — ABNORMAL HIGH (ref 150–400)
RBC: 4.66 MIL/uL (ref 3.87–5.11)
RDW: 16.9 % — ABNORMAL HIGH (ref 11.5–15.5)
WBC: 8.5 10*3/uL (ref 4.0–10.5)

## 2017-02-05 LAB — CBG MONITORING, ED: Glucose-Capillary: 315 mg/dL — ABNORMAL HIGH (ref 65–99)

## 2017-02-05 LAB — LIPASE, BLOOD: Lipase: 27 U/L (ref 11–51)

## 2017-02-05 LAB — GLUCOSE, CAPILLARY
GLUCOSE-CAPILLARY: 300 mg/dL — AB (ref 65–99)
GLUCOSE-CAPILLARY: 240 mg/dL — AB (ref 65–99)
Glucose-Capillary: 101 mg/dL — ABNORMAL HIGH (ref 65–99)

## 2017-02-05 MED ORDER — POLYETHYLENE GLYCOL 3350 17 G PO PACK: 17.0000 g | PACK | Freq: Every day | ORAL | Status: DC | PRN

## 2017-02-05 MED ORDER — LORAZEPAM 2 MG/ML IJ SOLN
1.0000 mg | Freq: Once | INTRAMUSCULAR | Status: AC
  Administered 2017-02-05: 1 mg via INTRAMUSCULAR
  Filled 2017-02-05: qty 1

## 2017-02-05 MED ORDER — ATORVASTATIN CALCIUM 40 MG PO TABS
40.0000 mg | ORAL_TABLET | Freq: Every day | ORAL | Status: DC
  Administered 2017-02-06: 40 mg via ORAL
  Filled 2017-02-05: qty 1

## 2017-02-05 MED ORDER — METOCLOPRAMIDE HCL 5 MG/ML IJ SOLN: 10.0000 mg | Freq: Once | INTRAMUSCULAR | Status: DC

## 2017-02-05 MED ORDER — FLUOXETINE HCL 20 MG PO CAPS
40.0000 mg | ORAL_CAPSULE | Freq: Every day | ORAL | Status: DC
  Administered 2017-02-06: 40 mg via ORAL
  Filled 2017-02-05: qty 2

## 2017-02-05 MED ORDER — ENOXAPARIN SODIUM 40 MG/0.4ML ~~LOC~~ SOLN: 40.0000 mg | SUBCUTANEOUS | Status: DC

## 2017-02-05 MED ORDER — ALBUTEROL SULFATE (2.5 MG/3ML) 0.083% IN NEBU: 3.0000 mL | INHALATION_SOLUTION | Freq: Four times a day (QID) | RESPIRATORY_TRACT | Status: DC | PRN

## 2017-02-05 MED ORDER — ZOLPIDEM TARTRATE 5 MG PO TABS
5.0000 mg | ORAL_TABLET | Freq: Every evening | ORAL | Status: DC | PRN
  Administered 2017-02-05: 5 mg via ORAL
  Filled 2017-02-05: qty 1

## 2017-02-05 MED ORDER — SODIUM CHLORIDE 0.9 % IV BOLUS (SEPSIS)
1000.0000 mL | Freq: Once | INTRAVENOUS | Status: AC
  Administered 2017-02-05 (×2): 1000 mL via INTRAVENOUS

## 2017-02-05 MED ORDER — FAMOTIDINE 20 MG PO TABS
20.0000 mg | ORAL_TABLET | Freq: Two times a day (BID) | ORAL | Status: DC
  Administered 2017-02-05 – 2017-02-06 (×2): 20 mg via ORAL
  Filled 2017-02-05 (×2): qty 1

## 2017-02-05 MED ORDER — METOCLOPRAMIDE HCL 5 MG/ML IJ SOLN
10.0000 mg | Freq: Once | INTRAMUSCULAR | Status: AC
  Administered 2017-02-05: 10 mg via INTRAMUSCULAR
  Filled 2017-02-05: qty 2

## 2017-02-05 MED ORDER — DIPHENHYDRAMINE HCL 50 MG/ML IJ SOLN
25.0000 mg | Freq: Once | INTRAMUSCULAR | Status: DC
Start: 2017-02-05 — End: 2017-02-05

## 2017-02-05 MED ORDER — ONDANSETRON HCL 4 MG/2ML IJ SOLN: 4.0000 mg | Freq: Four times a day (QID) | INTRAMUSCULAR | Status: DC | PRN

## 2017-02-05 MED ORDER — INSULIN ASPART 100 UNIT/ML ~~LOC~~ SOLN: 0.0000 [IU] | Freq: Every day | SUBCUTANEOUS | Status: DC

## 2017-02-05 MED ORDER — FERROUS SULFATE 325 (65 FE) MG PO TABS
325.0000 mg | ORAL_TABLET | Freq: Every day | ORAL | Status: DC
  Administered 2017-02-06: 325 mg via ORAL
  Filled 2017-02-05: qty 1

## 2017-02-05 MED ORDER — ACETAMINOPHEN 650 MG RE SUPP: 650.0000 mg | Freq: Four times a day (QID) | RECTAL | Status: DC | PRN

## 2017-02-05 MED ORDER — LORAZEPAM 1 MG PO TABS
1.0000 mg | ORAL_TABLET | Freq: Once | ORAL | Status: DC
Start: 2017-02-05 — End: 2017-02-05

## 2017-02-05 MED ORDER — ACETAMINOPHEN 325 MG PO TABS: 650.0000 mg | ORAL_TABLET | Freq: Four times a day (QID) | ORAL | Status: DC | PRN

## 2017-02-05 MED ORDER — PROMETHAZINE HCL 25 MG/ML IJ SOLN
12.5000 mg | Freq: Once | INTRAMUSCULAR | Status: AC
  Administered 2017-02-05: 12.5 mg via INTRAVENOUS
  Filled 2017-02-05: qty 1

## 2017-02-05 MED ORDER — HYDROCODONE-ACETAMINOPHEN 5-325 MG PO TABS
1.0000 | ORAL_TABLET | ORAL | Status: DC | PRN
  Administered 2017-02-05 – 2017-02-06 (×2): 2 via ORAL
  Filled 2017-02-05 (×2): qty 2

## 2017-02-05 MED ORDER — ONDANSETRON HCL 4 MG PO TABS: 4.0000 mg | ORAL_TABLET | Freq: Four times a day (QID) | ORAL | Status: DC | PRN

## 2017-02-05 MED ORDER — LORAZEPAM 1 MG PO TABS: 1.0000 mg | ORAL_TABLET | Freq: Three times a day (TID) | ORAL | Status: DC | PRN

## 2017-02-05 MED ORDER — BUSPIRONE HCL 10 MG PO TABS
10.0000 mg | ORAL_TABLET | Freq: Two times a day (BID) | ORAL | Status: DC
  Administered 2017-02-05 – 2017-02-06 (×2): 10 mg via ORAL
  Filled 2017-02-05 (×2): qty 1

## 2017-02-05 MED ORDER — HALOPERIDOL LACTATE 5 MG/ML IJ SOLN
2.0000 mg | Freq: Once | INTRAMUSCULAR | Status: AC
  Administered 2017-02-05: 2 mg via INTRAVENOUS
  Filled 2017-02-05: qty 1

## 2017-02-05 MED ORDER — SODIUM CHLORIDE 0.9 % IV SOLN
Freq: Once | INTRAVENOUS | Status: AC
  Administered 2017-02-05: 250 mL/h via INTRAVENOUS

## 2017-02-05 MED ORDER — POTASSIUM CHLORIDE IN NACL 20-0.45 MEQ/L-% IV SOLN
INTRAVENOUS | Status: DC
  Administered 2017-02-05 – 2017-02-06 (×2): via INTRAVENOUS
  Filled 2017-02-05 (×3): qty 1000

## 2017-02-05 MED ORDER — HYDROMORPHONE HCL 1 MG/ML IJ SOLN
2.0000 mg | Freq: Once | INTRAMUSCULAR | Status: AC
  Administered 2017-02-05: 2 mg via INTRAMUSCULAR
  Filled 2017-02-05: qty 2

## 2017-02-05 MED ORDER — PREGABALIN 75 MG PO CAPS
150.0000 mg | ORAL_CAPSULE | Freq: Two times a day (BID) | ORAL | Status: DC
  Administered 2017-02-05 – 2017-02-06 (×2): 150 mg via ORAL
  Filled 2017-02-05 (×2): qty 2

## 2017-02-05 MED ORDER — FUROSEMIDE 40 MG PO TABS: 40.0000 mg | ORAL_TABLET | Freq: Every day | ORAL | Status: DC | PRN

## 2017-02-05 MED ORDER — INSULIN ASPART 100 UNIT/ML ~~LOC~~ SOLN
0.0000 [IU] | Freq: Three times a day (TID) | SUBCUTANEOUS | Status: DC
  Administered 2017-02-05: 8 [IU] via SUBCUTANEOUS
  Administered 2017-02-05: 5 [IU] via SUBCUTANEOUS
  Administered 2017-02-06: 2 [IU] via SUBCUTANEOUS
  Administered 2017-02-06: 5 [IU] via SUBCUTANEOUS

## 2017-02-05 NOTE — ED Notes (Addendum)
Pt continues to place her fingers in her throat making herself throw up.

## 2017-02-05 NOTE — ED Provider Notes (Signed)
Clinical Course as of Feb 05 938  Tue Feb 05, 2017  0933 PH normal however bicarb is 10. Glucose is elevated.  Doubt ketoacidosis a this time.  Will continue to monitor  [JK]  0938 Pt continues to feel nauseated, unable to drink fluids despite treatment.  Will consult for admission.   [JK]    Clinical Course User Index [JK] Dorie Rank, MD      Dorie Rank, MD 02/05/17 [id number]

## 2017-02-05 NOTE — ED Notes (Addendum)
Pt placing fingers in her throat forcing herself to throw up. Asked pt to stop. Told pt she needs to be in a gown, pt refused.

## 2017-02-05 NOTE — ED Notes (Signed)
Pt noted to be on knees, leaning over back of bed, and spitting into an emesis bag.  Pt refused to change position in bed.  New emesis bag provided.  Yellow color liquid noted in used emesis bag.

## 2017-02-05 NOTE — Telephone Encounter (Signed)
Call placed to [phone], in order to speak with patient about switching appt time for this Thursday's appt. No answer. Left patient a message to return my call at [phone].

## 2017-02-05 NOTE — ED Notes (Signed)
IV team RN unable to get IV access in patient due to patient restless and not able to stay still while IV attempted. Requesting EDP to order IM medications to help calm patient down so can attempt IV access.  Made Dr Roxanne Mins aware, awaiting new orders.

## 2017-02-05 NOTE — ED Notes (Signed)
Unable to update Vital signs at this time due to pt behavior. Pt is yelling at this time.

## 2017-02-05 NOTE — ED Notes (Signed)
Notified Respiratory of VBG ordered.

## 2017-02-05 NOTE — ED Notes (Signed)
Patient states that she needs to urinate but refusing to ambulate to restroom.  Placed patient on bedpan and instructed patient to press call light when finished.

## 2017-02-05 NOTE — ED Provider Notes (Signed)
[place] DEPT Provider Note   CSN: [id number] Arrival date & time: 02/05/17  1696     History   Chief Complaint No chief complaint on file.   HPI [name] is a 51 y.o. female.  The history is provided by the patient.  She has a history of diabetic asked her cyst, and states that she started having abdominal pain and vomiting at 10 PM last night. Pain is severe and she rates it at 10/10. She has vomited numerous times. Nothing makes pain better, nothing makes it worse. She denies fever or chills. There's been no constipation or diarrhea.  Past Medical History:  Diagnosis Date  . Depression   . Diabetes mellitus without complication ([place])   . Essential hypertension   . Gastroparesis   . GERD (gastroesophageal reflux disease)   . HLD (hyperlipidemia)     Patient Active Problem List   Diagnosis Date Noted  . Gastroparesis 04/20/2016  . Type 1 diabetes mellitus with ketoacidosis without coma ([place])   . DKA (diabetic ketoacidoses) ([place]) 03/18/2016  . Noncompliance with treatment plan 03/13/2016  . Essential hypertension 01/24/2016  . Leukocytosis 01/24/2016  . Abnormal ECG 11/27/2015  . HLD (hyperlipidemia)   . GERD (gastroesophageal reflux disease)   . Metabolic acidosis, normal anion gap (NAG)   . AKI (acute kidney injury) ([place])   . Anemia, iron deficiency   . DKA, type 1, not at goal [facility])   . Hyponatremia 09/02/2015  . Vitamin B12 deficiency 08/16/2015  . Lactic acidosis 07/29/2015  . Acute pyelonephritis 07/29/2015  . Pyelonephritis   . Hypothermia   . Diabetic gastroparesis ([place])   . Diabetic neuropathy, type I diabetes mellitus ([place]) 05/18/2015  . Anemia 05/18/2015  . Anxiety and depression 05/18/2015  . Nausea & vomiting   . Diabetic ketoacidosis without coma associated with type 1 diabetes mellitus ([place])   . High anion gap metabolic acidosis 78/93/8101    Past Surgical History:  Procedure Laterality Date  . COLONOSCOPY  09/27/2014   at [facility]  .  ESOPHAGOGASTRODUODENOSCOPY  09/27/2014   at [facility], Dr Rolan Lipa. biospy neg for celiac, neg for H pylori.   . EYE SURGERY    . gailstones    . IR FLUORO GUIDE CV LINE RIGHT  02/01/2017  . IR GENERIC HISTORICAL  01/24/2016   IR FLUORO GUIDE CV LINE RIGHT 01/24/2016 Darrell K Allred, PA-C [facility]-INTERV RAD  . IR GENERIC HISTORICAL  01/24/2016   IR US GUIDE VASC ACCESS RIGHT 01/24/2016 Darrell K Allred, PA-C [facility]-INTER[facility]  . IR US GUIDE VASC ACCESS RIGHT  02/01/2017  . POSTERIOR VITRECTOMY AND MEMBRANE PEEL-LEFT EYE  09/28/2002  . POSTERIOR VITRECTOMY AND MEMBRANE PEEL-RIGHT EYE  03/16/2002  . RETINAL DETACHMENT SURGERY      OB History    No data available       Home Medications    Prior to Admission medications   Medication Sig Start Date End Date Taking? Authorizing Provider  albuterol (PROVENTIL HFA;VENTOLIN HFA) 108 (90 Base) MCG/ACT inhaler Inhale 2 puffs into the lungs every 6 (six) hours as needed for wheezing or shortness of breath. 10/20/15   [name], MD  atorvastatin (LIPITOR) 40 MG tablet Take 1 tablet (40 mg total) by mouth daily. 10/30/16   [name], MD  Blood Glucose Monitoring Suppl (ACCU-CHEK AVIVA) device Use as instructed 3 times daily before meals 10/30/16   [name], MD  busPIRone (BUSPAR) 10 MG tablet Take 1 tablet (10 mg total) by mouth 2 (  two) times daily. 10/30/16   [name], MD  famotidine (PEPCID) 20 MG tablet Take 1 tablet (20 mg total) by mouth 2 (two) times daily. 09/29/16   Doreatha Lew, MD  ferrous sulfate 325 (65 FE) MG tablet Take 1 tablet (325 mg total) by mouth daily with breakfast. 03/27/16   Donne Hazel, MD  FLUoxetine (PROZAC) 20 MG tablet Take 2 tablets (40 mg total) by mouth daily. 10/30/16   [name], MD  furosemide (LASIX) 40 MG tablet Take 40 mg by mouth daily as needed for fluid.     [provider]  glucose blood (ACCU-CHEK AVIVA) test strip Use as instructed 3 times daily before meals 10/30/16   [name], MD    insulin aspart (NOVOLOG) 100 UNIT/ML injection Inject 0-12 Units into the skin 3 (three) times daily with meals. Per sliding scale 10/30/16   [name], MD  insulin detemir (LEVEMIR) 100 UNIT/ML injection Inject 0.16 mLs (16 Units total) into the skin 2 (two) times daily. 02/03/17   Eugenie Filler, MD  Insulin Syringe-Needle U-100 (BD INSULIN SYRINGE ULTRAFINE) 31G X 15/64" 0.5 ML MISC 1 each by Does not apply route 4 (four) times daily as needed. 08/16/15   [name], MD  Lancet Devices [facility]) lancets Use as instructed 3 times daily before meals 10/30/16   [name], MD  LORazepam (ATIVAN) 1 MG tablet Take 1 tablet (1 mg total) by mouth every 8 (eight) hours as needed for anxiety. Patient not taking: Reported on 01/30/2017 01/09/17   Varney Biles, MD  metoCLOPramide (REGLAN) 5 MG/5ML solution Take 10 mLs (10 mg total) by mouth 4 (four) times daily -  before meals and at bedtime. 02/03/17   Eugenie Filler, MD  ondansetron (ZOFRAN ODT) 4 MG disintegrating tablet Take 1 tablet (4 mg total) by mouth every 8 (eight) hours as needed for nausea or vomiting. 09/04/16   [name], MD  polyethylene glycol (MIRALAX / GLYCOLAX) packet Take 17 g by mouth daily as needed for mild constipation. 09/04/16   [name], MD  pregabalin (LYRICA) 150 MG capsule Take 1 capsule (150 mg total) by mouth 2 (two) times daily. 01/17/17   Argentina Donovan, PA-C  promethazine (PHENERGAN) 25 MG tablet Take 1 tablet (25 mg total) by mouth every 6 (six) hours as needed for nausea. Patient not taking: Reported on 01/31/2017 01/09/17   Varney Biles, MD  vitamin B-12 (CYANOCOBALAMIN) 1000 MCG tablet Take 1 tablet (1,000 mcg total) by mouth daily. 02/03/17   Eugenie Filler, MD    Family History Family History  Problem Relation Age of Onset  . Cystic fibrosis Mother   . Hypertension Father   . Diabetes Brother   . Hypertension Maternal Grandmother     Social History Social History   Substance Use Topics  . Smoking status: Never Smoker  . Smokeless tobacco: Never Used  . Alcohol use No     Allergies   Anesthetics, amide; Penicillins; Buprenorphine hcl; and Encainide   Review of Systems Review of Systems  All other systems reviewed and are negative.    Physical Exam Updated Vital Signs BP (!) 189/95 Comment: movement  Pulse (!) 105   Temp 97.9 F (36.6 C)   Resp (!) 22   SpO2 100%   Physical Exam  Nursing note and vitals reviewed.  51 year old female, appears uncomfortable, intermittently retching, but is in no acute distress. Vital signs are significant for hypertension, tachycardia, tachypnea. Oxygen  saturation is 100%, which is normal. Head is normocephalic and atraumatic. PERRLA, EOMI. Oropharynx is clear. Neck is nontender and supple without adenopathy or JVD. Back is nontender and there is no CVA tenderness. Lungs are clear without rales, wheezes, or rhonchi. Chest is nontender. Heart has regular rate and rhythm without murmur. Abdomen is soft, flat, with mild tenderness diffusely. There is no rebound or guarding. There are no masses or hepatosplenomegaly and peristalsis is hypoactive. Extremities have no cyanosis or edema, full range of motion is present. Skin is warm and dry without rash. Neurologic: Mental status is normal, cranial nerves are intact, there are no motor or sensory deficits.  ED Treatments / Results  Labs (all labs ordered are listed, but only abnormal results are displayed) Labs Reviewed - No data to display  Procedures Procedures (including critical care time)  Medications Ordered in ED Medications  sodium chloride 0.9 % bolus 1,000 mL (not administered)  0.9 %  sodium chloride infusion (not administered)  metoCLOPramide (REGLAN) injection 10 mg (not administered)  LORazepam (ATIVAN) injection 1 mg (not administered)  HYDROmorphone (DILAUDID) injection 2 mg (not administered)     Initial Impression / Assessment  and Plan / ED Course  I have reviewed the triage vital signs and the nursing notes.  Pertinent lab results that were available during my care of the patient were reviewed by me and considered in my medical decision making (see chart for details).  Patient with history of diabetic gastroparesis, and apparent exacerbation. Old records are reviewed, and she has numerous ED visits for nausea and vomiting, occasional hospital admission for gastroparesis. Also history of ketoacidosis with hospital admission. She will be given IV fluids, metoclopramide, lorazepam.  IV team had to be consulted for IV access. Patient is rocking back and forth and preventing them from getting IV access. Therefore, she is given initial doses of metoclopramide, lorazepam and hydromorphone intramuscularly. Case is signed out to Dr. Tomi Bamberger.  Final Clinical Impressions(s) / ED Diagnoses   Final diagnoses:  Cyclical vomiting with nausea, intractability of vomiting not specified    New Prescriptions New Prescriptions   No medications on file     Delora Fuel, MD 74/16/38 [phone]

## 2017-02-05 NOTE — ED Notes (Signed)
Pt refusing to lay in bed on back. Keeps moving all over bed while trying to update vital signs. Pt sitting on knees and hanging head over top of bed.

## 2017-02-05 NOTE — ED Triage Notes (Signed)
Per EMS pt coming from home complaining of gastroparesis. Pt states she has had abdominal pain and vomiting since 10pm. No vomit seen by EMS, only water in emesis bag. Pt refused to cooperate with EMS prior to arrival. CBG 168.

## 2017-02-05 NOTE — ED Notes (Signed)
Attempted IV placement x2 with no success. IV team consult to be placed. MD made aware of medication delay.

## 2017-02-05 NOTE — ED Notes (Signed)
IV team at bedside 

## 2017-02-05 NOTE — H&P (Addendum)
[facility]                                                                                    Patient Demographics  [name], is a 51 y.o. female  CSN: [id number]  MRN: [id number]  DOB - 06/15/1966  Admit Date - 02/05/2017  Outpatient Primary MD for the patient is [name], MD   With History of -  Past Medical History:  Diagnosis Date  . Depression   . Diabetes mellitus without complication ([place])   . Essential hypertension   . Gastroparesis   . GERD (gastroesophageal reflux disease)   . HLD (hyperlipidemia)       Past Surgical History:  Procedure Laterality Date  . COLONOSCOPY  09/27/2014   at [facility]  . ESOPHAGOGASTRODUODENOSCOPY  09/27/2014   at [facility], Dr Rolan Lipa. biospy neg for celiac, neg for H pylori.   . EYE SURGERY    . gailstones    . IR FLUORO GUIDE CV LINE RIGHT  02/01/2017  . IR GENERIC HISTORICAL  01/24/2016   IR FLUORO GUIDE CV LINE RIGHT 01/24/2016 Darrell K Allred, PA-C [facility]  . IR GENERIC HISTORICAL  01/24/2016   IR US GUIDE VASC ACCESS RIGHT 01/24/2016 Darrell K Allred, PA-C [facility]  . IR US GUIDE VASC ACCESS RIGHT  02/01/2017  . POSTERIOR VITRECTOMY AND MEMBRANE PEEL-LEFT EYE  09/28/2002  . POSTERIOR VITRECTOMY AND MEMBRANE PEEL-RIGHT EYE  03/16/2002  . RETINAL DETACHMENT SURGERY      in for   No chief complaint on file.    HPI  [name]  is a 51 y.o. female, With history of diabetes mellitus and gastroparesis with multiple admissions to the ER on the hospital for same problem, presenting today with one-day history of abdominal pain and intractable nausea and vomiting. Patient could not keep anything in. In the emergency room she was found to be hyperglycemic and slightly acidotic. She is not be admitted under observation for IV fluids and symptom control.    Review of Systems    In addition to the HPI above,  No Fever-chills, No Headache, No changes with Vision or hearing, No Chest pain, Cough or  Shortness of Breath,, No Blood in stool or Urine, No dysuria, No new skin rashes or bruises, No new joints pains-aches,  No new weakness, tingling, numbness in any extremity, No recent weight gain or loss, No polyuria, polydypsia or polyphagia, No significant Mental Stressors.  A full 10 point Review of Systems was done, except as stated above, all other Review of Systems were negative.   Social History Social History  Substance Use Topics  . Smoking status: Never Smoker  . Smokeless tobacco: Never Used  . Alcohol use No     Family History Family History  Problem Relation Age of Onset  . Cystic fibrosis Mother   . Hypertension Father   . Diabetes Brother   . Hypertension Maternal Grandmother      Prior to Admission medications   Medication Sig Start Date End Date Taking? Authorizing Provider  albuterol (PROVENTIL HFA;VENTOLIN HFA) 108 (90 Base) MCG/ACT inhaler Inhale 2 puffs into the lungs every 6 (six)  hours as needed for wheezing or shortness of breath. 10/20/15   [name], MD  atorvastatin (LIPITOR) 40 MG tablet Take 1 tablet (40 mg total) by mouth daily. 10/30/16   [name], MD  Blood Glucose Monitoring Suppl (ACCU-CHEK AVIVA) device Use as instructed 3 times daily before meals 10/30/16   [name], MD  busPIRone (BUSPAR) 10 MG tablet Take 1 tablet (10 mg total) by mouth 2 (two) times daily. 10/30/16   [name], MD  famotidine (PEPCID) 20 MG tablet Take 1 tablet (20 mg total) by mouth 2 (two) times daily. 09/29/16   Doreatha Lew, MD  ferrous sulfate 325 (65 FE) MG tablet Take 1 tablet (325 mg total) by mouth daily with breakfast. 03/27/16   Donne Hazel, MD  FLUoxetine (PROZAC) 20 MG tablet Take 2 tablets (40 mg total) by mouth daily. 10/30/16   [name], MD  furosemide (LASIX) 40 MG tablet Take 40 mg by mouth daily as needed for fluid.     [provider]  glucose blood (ACCU-CHEK AVIVA) test strip Use as instructed 3 times daily before  meals 10/30/16   [name], MD  insulin aspart (NOVOLOG) 100 UNIT/ML injection Inject 0-12 Units into the skin 3 (three) times daily with meals. Per sliding scale 10/30/16   [name], MD  insulin detemir (LEVEMIR) 100 UNIT/ML injection Inject 0.16 mLs (16 Units total) into the skin 2 (two) times daily. 02/03/17   Eugenie Filler, MD  Insulin Syringe-Needle U-100 (BD INSULIN SYRINGE ULTRAFINE) 31G X 15/64" 0.5 ML MISC 1 each by Does not apply route 4 (four) times daily as needed. 08/16/15   [name], MD  Lancet Devices [facility]) lancets Use as instructed 3 times daily before meals 10/30/16   [name], MD  LORazepam (ATIVAN) 1 MG tablet Take 1 tablet (1 mg total) by mouth every 8 (eight) hours as needed for anxiety. Patient not taking: Reported on 01/30/2017 01/09/17   Varney Biles, MD  metoCLOPramide (REGLAN) 5 MG/5ML solution Take 10 mLs (10 mg total) by mouth 4 (four) times daily -  before meals and at bedtime. 02/03/17   Eugenie Filler, MD  ondansetron (ZOFRAN ODT) 4 MG disintegrating tablet Take 1 tablet (4 mg total) by mouth every 8 (eight) hours as needed for nausea or vomiting. 09/04/16   [name], MD  polyethylene glycol (MIRALAX / GLYCOLAX) packet Take 17 g by mouth daily as needed for mild constipation. 09/04/16   [name], MD  pregabalin (LYRICA) 150 MG capsule Take 1 capsule (150 mg total) by mouth 2 (two) times daily. 01/17/17   Argentina Donovan, PA-C  promethazine (PHENERGAN) 25 MG tablet Take 1 tablet (25 mg total) by mouth every 6 (six) hours as needed for nausea. Patient not taking: Reported on 01/31/2017 01/09/17   Varney Biles, MD  vitamin B-12 (CYANOCOBALAMIN) 1000 MCG tablet Take 1 tablet (1,000 mcg total) by mouth daily. 02/03/17   Eugenie Filler, MD    Allergies  Allergen Reactions  . Anesthetics, Amide Nausea And Vomiting  . Penicillins Diarrhea and Nausea And Vomiting    Has patient had a PCN reaction causing immediate rash,  facial/tongue/throat swelling, SOB or lightheadedness with hypotension: No Has patient had a PCN reaction causing severe rash involving mucus membranes or skin necrosis: No Has patient had a PCN reaction that required hospitalization No Has patient had a PCN reaction occurring within the last 10 years: Yes  If all of the above answers  are "NO", then may proceed with Cephalosporin use.   . Buprenorphine Hcl Rash  . Encainide Nausea And Vomiting    Physical Exam  Vitals  Blood pressure (!) 172/72, pulse 97, temperature 97.9 F (36.6 C), resp. rate 16, SpO2 100 %.   1. General well-developed, well-nourished in pain  2. Flat affect and insight, Not Suicidal or Homicidal, Awake Alert, Oriented X 3.  3. No F.N deficits, grossly  4. Ears and Eyes appear Normal, Conjunctivae clear, PERRLA. Moist Oral Mucosa.  5. Supple Neck, No JVD, No cervical lymphadenopathy appriciated, No Carotid Bruits.  6. Symmetrical Chest wall movement, Good air movement bilaterally, CTAB.  7. RRR, No Gallops, Rubs or Murmurs, No Parasternal Heave.  8. Positive Bowel Sounds, Abdomen Soft, mild generalized tenderness, bowel sounds present,No rebound -guarding or rigidity.  9.  No Cyanosis, Normal Skin Turgor, No Skin Rash or Bruise.  10. Good muscle tone,  joints appear normal , no effusions, Normal ROM.    Data Review  CBC  Recent Labs Lab 01/31/17 1334 02/01/17 1431 02/02/17 0900 02/03/17 0830 02/05/17 0843  WBC 18.4* 19.6* 9.1  --  8.5  HGB 9.3* 9.4* 8.0* 8.7* 10.5*  HCT 30.7* 31.0* 25.7* 27.9* 33.4*  PLT 412* 414* 325  --  442*  MCV 72.4* 74.0* 73.4*  --  71.7*  MCH 21.9* 22.4* 22.9*  --  22.5*  MCHC 30.3 30.3 31.1  --  31.4  RDW 16.8* 17.4* 17.8*  --  16.9*  LYMPHSABS 0.6*  --  2.2  --  0.6*  MONOABS 0.9  --  0.6  --  0.3  EOSABS 0.0  --  0.2  --  0.0  BASOSABS 0.0  --  0.0  --  0.0    ------------------------------------------------------------------------------------------------------------------  Chemistries   Recent Labs Lab 01/31/17 1334 02/01/17 0258 02/01/17 1431 02/02/17 0344 02/03/17 0500 02/05/17 0843  NA 138 149* 141 139 139 137  K 4.6 3.8 3.9 4.0 4.2 3.3*  CL 108 120* 115* 113* 109 98*  CO2 10* 18* 17* 18* 22 19*  GLUCOSE 579* 134* 171* 157* 189* 350*  BUN 26* 24* _0 CREATININE 1.40* 1.27* 1.08* 1.07* 0.95 1.07*  CALCIUM 9.0 9.0 8.5* 8.2* 8.3* 9.9  AST 18  --   --   --   --  32  ALT 12*  --   --   --   --  22  ALKPHOS 80  --   --   --   --  96  BILITOT 1.7*  --   --   --   --  1.0   ------------------------------------------------------------------------------------------------------------------ estimated creatinine clearance is 50.3 mL/min (A) (by C-G formula based on SCr of 1.07 mg/dL (H)). ------------------------------------------------------------------------------------------------------------------ No results for input(s): TSH, T4TOTAL, T3FREE, THYROIDAB in the last 72 hours.  Invalid input(s): FREET3   Coagulation profile No results for input(s): INR, PROTIME in the last 168 hours. ------------------------------------------------------------------------------------------------------------------- No results for input(s): DDIMER in the last 72 hours. -------------------------------------------------------------------------------------------------------------------  Cardiac Enzymes  Recent Labs Lab 01/31/17 1334  TROPONINI <0.03   ------------------------------------------------------------------------------------------------------------------ Invalid input(s): POCBNP   ---------------------------------------------------------------------------------------------------------------  Urinalysis    Component Value Date/Time   COLORURINE STRAW (A) 01/31/2017 1509   APPEARANCEUR CLEAR 01/31/2017 1509   LABSPEC 1.022  01/31/2017 1509   PHURINE 5.0 01/31/2017 1509   GLUCOSEU >=500 (A) 01/31/2017 1509   HGBUR LARGE (A) 01/31/2017 [place] 01/31/2017 1509   BILIRUBINUR neg 01/17/2017 1533   KETONESUR 80 (  A) 01/31/2017 1509   PROTEINUR 100 (A) 01/31/2017 1509   UROBILINOGEN 0.2 01/17/2017 1533   UROBILINOGEN 0.2 04/27/2015 1608   NITRITE NEGATIVE 01/31/2017 1509   LEUKOCYTESUR NEGATIVE 01/31/2017 1509    ----------------------------------------------------------------------------------------------------------------     Imaging results:   Ir Fluoro Guide Cv Line Right  Result Date: 02/01/2017 INDICATION: Poor venous access. In need of durable intravenous access for medication administration and blood draws. EXAM: ULTRASOUND AND FLUOROSCOPIC GUIDED PICC LINE INSERTION MEDICATIONS: None. CONTRAST:  None FLUOROSCOPY TIME:  6 seconds (1 mGy) COMPLICATIONS: None immediate. TECHNIQUE: The procedure, risks, benefits, and alternatives were explained to the patient and informed written consent was obtained. A timeout was performed prior to the initiation of the procedure. The right upper extremity was prepped with chlorhexidine in a sterile fashion, and a sterile drape was applied covering the operative field. Maximum barrier sterile technique with sterile gowns and gloves were used for the procedure. A timeout was performed prior to the initiation of the procedure. Local anesthesia was provided with 1% lidocaine. Under direct ultrasound guidance, the brachial vein was accessed with a micropuncture kit after the overlying soft tissues were anesthetized with 1% lidocaine. After the overlying soft tissues were anesthetized, a small venotomy incision was created and a micropuncture kit was utilized to access the right brachial vein. Real-time ultrasound guidance was utilized for vascular access including the acquisition of a permanent ultrasound image documenting patency of the accessed vessel. A  guidewire was advanced to the level of the superior caval-atrial junction for measurement purposes and the PICC line was cut to length. A peel-away sheath was placed and a 31 cm, 5 [place], dual lumen was inserted to level of the superior caval-atrial junction. A post procedure spot fluoroscopic was obtained. The catheter easily aspirated and flushed and was sutured in place. A dressing was placed. The patient tolerated the procedure well without immediate post procedural complication. FINDINGS: After catheter placement, the tip lies within the superior cavoatrial junction. The catheter aspirates and flushes normally and is ready for immediate use. IMPRESSION: Successful ultrasound and fluoroscopic guided placement of a right brachial vein approach, 31 cm, 5 French, dual lumen PICC with tip at the superior caval-atrial junction. The PICC line is ready for immediate use. Electronically Signed   By: Sandi Mariscal M.D.   On: 02/01/2017 17:10   Ir US Guide Vasc Access Right  Result Date: 02/01/2017 INDICATION: Poor venous access. In need of durable intravenous access for medication administration and blood draws. EXAM: ULTRASOUND AND FLUOROSCOPIC GUIDED PICC LINE INSERTION MEDICATIONS: None. CONTRAST:  None FLUOROSCOPY TIME:  6 seconds (1 mGy) COMPLICATIONS: None immediate. TECHNIQUE: The procedure, risks, benefits, and alternatives were explained to the patient and informed written consent was obtained. A timeout was performed prior to the initiation of the procedure. The right upper extremity was prepped with chlorhexidine in a sterile fashion, and a sterile drape was applied covering the operative field. Maximum barrier sterile technique with sterile gowns and gloves were used for the procedure. A timeout was performed prior to the initiation of the procedure. Local anesthesia was provided with 1% lidocaine. Under direct ultrasound guidance, the brachial vein was accessed with a micropuncture kit after the overlying  soft tissues were anesthetized with 1% lidocaine. After the overlying soft tissues were anesthetized, a small venotomy incision was created and a micropuncture kit was utilized to access the right brachial vein. Real-time ultrasound guidance was utilized for vascular access including the acquisition of a permanent ultrasound image documenting  patency of the accessed vessel. A guidewire was advanced to the level of the superior caval-atrial junction for measurement purposes and the PICC line was cut to length. A peel-away sheath was placed and a 31 cm, 5 [place], dual lumen was inserted to level of the superior caval-atrial junction. A post procedure spot fluoroscopic was obtained. The catheter easily aspirated and flushed and was sutured in place. A dressing was placed. The patient tolerated the procedure well without immediate post procedural complication. FINDINGS: After catheter placement, the tip lies within the superior cavoatrial junction. The catheter aspirates and flushes normally and is ready for immediate use. IMPRESSION: Successful ultrasound and fluoroscopic guided placement of a right brachial vein approach, 31 cm, 5 French, dual lumen PICC with tip at the superior caval-atrial junction. The PICC line is ready for immediate use. Electronically Signed   By: Sandi Mariscal M.D.   On: 02/01/2017 17:10   Dg Chest Portable 1 View  Result Date: 01/31/2017 CLINICAL DATA:  DKA. EXAM: PORTABLE CHEST 1 VIEW COMPARISON:  03/22/2016 FINDINGS: The cardiomediastinal silhouette is unremarkable. There is no evidence of focal airspace disease, pulmonary edema, suspicious pulmonary nodule/mass, pleural effusion, or pneumothorax. No acute bony abnormalities are identified. IMPRESSION: No active disease. Electronically Signed   By: Margarette Canada M.D.   On: 01/31/2017 17:58      Assessment & Plan  1. Gastroparesis with acute episode of nausea vomiting/intractable 2. Diabetes mellitus uncontrolled 3. History of  anxiety 4. Hypertension -monitor for now? Stress  Plan  Admit to MedSurg IV fluids Minimize pain medications due to gastroparesis Replete potassium Check KUB Symptoms relief    DVT Prophylaxis Lovenox  AM Labs Ordered, also please review Full Orders  Code Status full  Disposition Plan: Home  Time spent in minutes : 38 minutes  Condition GUARDED   _0 @

## 2017-02-05 NOTE — Telephone Encounter (Signed)
Call placed to Dr. Marquis Buggy office [phone] to follow up on the status of referral faxed yesterday. Spoke with Latoya and she informed that it was received.

## 2017-02-06 DIAGNOSIS — I10 Essential (primary) hypertension: Secondary | ICD-10-CM

## 2017-02-06 DIAGNOSIS — E1040 Type 1 diabetes mellitus with diabetic neuropathy, unspecified: Secondary | ICD-10-CM

## 2017-02-06 DIAGNOSIS — G43A0 Cyclical vomiting, not intractable: Secondary | ICD-10-CM | POA: Diagnosis not present

## 2017-02-06 DIAGNOSIS — E876 Hypokalemia: Secondary | ICD-10-CM

## 2017-02-06 DIAGNOSIS — G43A1 Cyclical vomiting, intractable: Secondary | ICD-10-CM

## 2017-02-06 LAB — BASIC METABOLIC PANEL
ANION GAP: 8 (ref 5–15)
BUN: 13 mg/dL (ref 6–20)
CHLORIDE: 107 mmol/L (ref 101–111)
CO2: 19 mmol/L — AB (ref 22–32)
Calcium: 8.3 mg/dL — ABNORMAL LOW (ref 8.9–10.3)
Creatinine, Ser: 0.92 mg/dL (ref 0.44–1.00)
GFR calc non Af Amer: >60 mL/min (ref >60)
Glucose, Bld: 187 mg/dL — ABNORMAL HIGH (ref 65–99)
POTASSIUM: 5 mmol/L (ref 3.5–5.1)
SODIUM: 134 mmol/L — AB (ref 135–145)

## 2017-02-06 LAB — CBC
HCT: 28.3 % — ABNORMAL LOW (ref 36.0–46.0)
HEMOGLOBIN: 8.9 g/dL — AB (ref 12.0–15.0)
MCH: 22.8 pg — AB (ref 26.0–34.0)
MCHC: 31.4 g/dL (ref 30.0–36.0)
MCV: 72.6 fL — AB (ref 78.0–100.0)
Platelets: 384 10*3/uL (ref 150–400)
RBC: 3.9 MIL/uL (ref 3.87–5.11)
RDW: 17.9 % — ABNORMAL HIGH (ref 11.5–15.5)
WBC: 6.6 10*3/uL (ref 4.0–10.5)

## 2017-02-06 LAB — GLUCOSE, CAPILLARY
GLUCOSE-CAPILLARY: 201 mg/dL — AB (ref 65–99)
Glucose-Capillary: 140 mg/dL — ABNORMAL HIGH (ref 65–99)

## 2017-02-06 MED ORDER — METOCLOPRAMIDE HCL 5 MG/5ML PO SOLN: 10.0000 mg | Freq: Three times a day (TID) | ORAL | 0 refills | Status: DC

## 2017-02-06 MED ORDER — METOCLOPRAMIDE HCL 5 MG/ML IJ SOLN
10.0000 mg | Freq: Four times a day (QID) | INTRAMUSCULAR | Status: DC
  Administered 2017-02-06: 10 mg via INTRAVENOUS
  Filled 2017-02-06: qty 2

## 2017-02-06 NOTE — Hospital Discharge Follow-Up (Signed)
The patient is known to the [facility]. An appointment has been scheduled for 02/12/17 @ 0945 and the information has been placed on the AVS  She has been referred to [facility] and [facility] in the past as well as to Dr Darleene Cleaver for management of anxiety and depression - awaiting appointment.   Will follow her hospital course.

## 2017-02-06 NOTE — Progress Notes (Signed)
Date: February 06, 2017 Chart reviewed for discharge orders: None found for case management. Vernia Buff, [phone]

## 2017-02-06 NOTE — Progress Notes (Signed)
Pts Ive removed with a clean and dry dressing intact. Pt denies pain at the time of d/c with no s/s of distress noted. Taxi called for pt transportation and given a taxi voucher. Pt taken to the front entrance by nursing staff.

## 2017-02-06 NOTE — Care Management Note (Signed)
Case Management Note  Patient Details  Name: Bellina Tokarczyk MRN: [id number] Date of Birth: [birth year]  Subjective/Objective:     Hyperglycemia and gastroporesis               Action/Plan: Date:  February 06, 2017 Chart reviewed for concurrent status and case management needs. Will continue to follow patient progress. Discharge Planning: following for needs Expected discharge date: [id number] Velva Harman, BSN, [place], [place]  Expected Discharge Date:   (unknown)               Expected Discharge Plan:  Home/Self Care  In-House Referral:     Discharge planning Services  CM Consult  Post Acute Care Choice:  Resumption of Svcs/PTA Provider Choice offered to:     DME Arranged:    DME Agency:     HH Arranged:  Disease Management [place] Agency:     Status of Service:  In process, will continue to follow  If discussed at Long Length of Stay Meetings, dates discussed:    Additional Comments:  Leeroy Cha, RN 02/06/2017, 9:04 AM

## 2017-02-06 NOTE — Progress Notes (Signed)
CSW received call from RN," Patient has does not have transportation home." CSW inquired about patient ability to ride bus, call for family or pay for cab. Nurse reports patient family unable to pick her from hospital, the bus stop is two miles from her home and does not have money to pay for cab. CSW checked chart, patient has not received cab voucher in past.  CSW provided patient with cab voucher to her listed address.   Kathrin Greathouse, Latanya Presser, MSW Clinical Social Worker 5E and Psychiatric Service Line [phone] 02/06/2017  3:56 PM

## 2017-02-06 NOTE — Discharge Summary (Signed)
Physician Discharge Summary  [name]:[id number] DOB: 06-11-1966 DOA: 02/05/2017  PCP: [name], MD  Admit date: 02/05/2017 Discharge date: 02/06/2017  Admitted From: Home Disposition:  Home  Recommendations for Outpatient Follow-up:  1. Follow-up with primary care doctor 1-2 weeks, she needs to be referred to motility clinic. 2. Patient also noted to have low vitamin B 12 which will need to be follow-up as an outpatient.   Home Health:No Equipment/Devices:none  Discharge Condition:stable CODE STATUS:full Diet recommendation: Heart Healthy  Brief/Interim Summary: 51 year old female with past medical history of diabetes mellitus and gastroparesis with multiple admissions for 236 exacerbation with a history of noncompliance comes in with intractable nausea and vomiting.  Discharge Diagnoses:  Active Problems:   Diabetic neuropathy, type I diabetes mellitus ([place])   Diabetic gastroparesis ([place])   Essential hypertension   Noncompliance with treatment plan   Intractable nausea and vomiting   Hypokalemia  Intractable nausea and vomiting due to   Diabetic gastroparesis ([place]): Mild rise creatinine, she was placed nothing by mouth and started on IV fluids. Her creatinine returned to baseline. She was started on IV Reglan which she did not require but she was able to tolerate her diet without any treatment. He needs to be referred to motility clinic as an outpatient. No changes made to her diabetic medication.  Essential hypertension: Changes made to her medication.  Hypokalemia: It was repleted orally now resolved.  Microcytic anemia: In a menstruating female likely iron deficient. Will need further workup as an outpatient.  Thrombocytosis: Acting as an acute phase reactant. Remained afebrile with no other complaints.  Noncompliance with treatment plan   Discharge Instructions  Discharge Instructions    Diet - low sodium heart healthy    Complete by:  As  directed    Increase activity slowly    Complete by:  As directed      Allergies as of 02/06/2017      Reactions   Anesthetics, Amide Nausea And Vomiting   Penicillins Diarrhea, Nausea And Vomiting   Has patient had a PCN reaction causing immediate rash, facial/tongue/throat swelling, SOB or lightheadedness with hypotension: No Has patient had a PCN reaction causing severe rash involving mucus membranes or skin necrosis: No Has patient had a PCN reaction that required hospitalization No Has patient had a PCN reaction occurring within the last 10 years: Yes  If all of the above answers are "NO", then may proceed with Cephalosporin use.   Buprenorphine Hcl Rash   Encainide Nausea And Vomiting      Medication List    TAKE these medications   ACCU-CHEK AVIVA device Use as instructed 3 times daily before meals   accu-chek softclix lancets Use as instructed 3 times daily before meals   albuterol 108 (90 Base) MCG/ACT inhaler Commonly known as:  PROVENTIL HFA;VENTOLIN HFA Inhale 2 puffs into the lungs every 6 (six) hours as needed for wheezing or shortness of breath.   atorvastatin 40 MG tablet Commonly known as:  LIPITOR Take 1 tablet (40 mg total) by mouth daily.   busPIRone 10 MG tablet Commonly known as:  BUSPAR Take 1 tablet (10 mg total) by mouth 2 (two) times daily.   famotidine 20 MG tablet Commonly known as:  PEPCID Take 1 tablet (20 mg total) by mouth 2 (two) times daily.   ferrous sulfate 325 (65 FE) MG tablet Take 1 tablet (325 mg total) by mouth daily with breakfast.   FLUoxetine 20 MG tablet Commonly known as:  PROZAC  Take 2 tablets (40 mg total) by mouth daily.   furosemide 40 MG tablet Commonly known as:  LASIX Take 40 mg by mouth daily as needed for fluid.   glucose blood test strip Commonly known as:  ACCU-CHEK AVIVA Use as instructed 3 times daily before meals   insulin aspart 100 UNIT/ML injection Commonly known as:  novoLOG Inject 0-12 Units  into the skin 3 (three) times daily with meals. Per sliding scale   insulin detemir 100 UNIT/ML injection Commonly known as:  LEVEMIR Inject 0.16 mLs (16 Units total) into the skin 2 (two) times daily.   Insulin Syringe-Needle U-100 31G X 15/64" 0.5 ML Misc Commonly known as:  BD INSULIN SYRINGE ULTRAFINE 1 each by Does not apply route 4 (four) times daily as needed.   LORazepam 1 MG tablet Commonly known as:  ATIVAN Take 1 tablet (1 mg total) by mouth every 8 (eight) hours as needed for anxiety.   metoCLOPramide 5 MG/5ML solution Commonly known as:  REGLAN Take 10 mLs (10 mg total) by mouth 4 (four) times daily -  before meals and at bedtime.   ondansetron 4 MG disintegrating tablet Commonly known as:  ZOFRAN ODT Take 1 tablet (4 mg total) by mouth every 8 (eight) hours as needed for nausea or vomiting.   polyethylene glycol packet Commonly known as:  MIRALAX / GLYCOLAX Take 17 g by mouth daily as needed for mild constipation.   pregabalin 150 MG capsule Commonly known as:  LYRICA Take 1 capsule (150 mg total) by mouth 2 (two) times daily.   promethazine 25 MG tablet Commonly known as:  PHENERGAN Take 1 tablet (25 mg total) by mouth every 6 (six) hours as needed for nausea.   vitamin B-12 1000 MCG tablet Commonly known as:  CYANOCOBALAMIN Take 1 tablet (1,000 mcg total) by mouth daily.      Follow-up Information    [facility]. Go on 02/12/2017.   Why:  at 9:45am for an appointment with Dr Jarold Song  in the [place] information: [place] [phone]         Allergies  Allergen Reactions  . Anesthetics, Amide Nausea And Vomiting  . Penicillins Diarrhea and Nausea And Vomiting    Has patient had a PCN reaction causing immediate rash, facial/tongue/throat swelling, SOB or lightheadedness with hypotension: No Has patient had a PCN reaction causing severe rash involving  mucus membranes or skin necrosis: No Has patient had a PCN reaction that required hospitalization No Has patient had a PCN reaction occurring within the last 10 years: Yes  If all of the above answers are "NO", then may proceed with Cephalosporin use.   . Buprenorphine Hcl Rash  . Encainide Nausea And Vomiting    Consultations:  None   Procedures/Studies: Dg Abd 1 View  Result Date: 02/05/2017 CLINICAL DATA:  Abdominal pain EXAM: ABDOMEN - 1 VIEW COMPARISON:  01/19/2016 FINDINGS: Moderate stool burden throughout the colon. Prior cholecystectomy. There is a non obstructive bowel gas pattern. No supine evidence of free air. No organomegaly or suspicious calcification. No acute bony abnormality. IMPRESSION: Moderate stool burden.  No acute findings. Electronically Signed   By: Rolm Baptise M.D.   On: 02/05/2017 14:17   Ir Fluoro Guide Cv Line Right  Result Date: 02/01/2017 INDICATION: Poor venous access. In need of durable intravenous access for medication administration and blood draws. EXAM: ULTRASOUND AND FLUOROSCOPIC GUIDED PICC LINE INSERTION MEDICATIONS:  None. CONTRAST:  None FLUOROSCOPY TIME:  6 seconds (1 mGy) COMPLICATIONS: None immediate. TECHNIQUE: The procedure, risks, benefits, and alternatives were explained to the patient and informed written consent was obtained. A timeout was performed prior to the initiation of the procedure. The right upper extremity was prepped with chlorhexidine in a sterile fashion, and a sterile drape was applied covering the operative field. Maximum barrier sterile technique with sterile gowns and gloves were used for the procedure. A timeout was performed prior to the initiation of the procedure. Local anesthesia was provided with 1% lidocaine. Under direct ultrasound guidance, the brachial vein was accessed with a micropuncture kit after the overlying soft tissues were anesthetized with 1% lidocaine. After the overlying soft tissues were anesthetized, a  small venotomy incision was created and a micropuncture kit was utilized to access the right brachial vein. Real-time ultrasound guidance was utilized for vascular access including the acquisition of a permanent ultrasound image documenting patency of the accessed vessel. A guidewire was advanced to the level of the superior caval-atrial junction for measurement purposes and the PICC line was cut to length. A peel-away sheath was placed and a 31 cm, 5 [place], dual lumen was inserted to level of the superior caval-atrial junction. A post procedure spot fluoroscopic was obtained. The catheter easily aspirated and flushed and was sutured in place. A dressing was placed. The patient tolerated the procedure well without immediate post procedural complication. FINDINGS: After catheter placement, the tip lies within the superior cavoatrial junction. The catheter aspirates and flushes normally and is ready for immediate use. IMPRESSION: Successful ultrasound and fluoroscopic guided placement of a right brachial vein approach, 31 cm, 5 French, dual lumen PICC with tip at the superior caval-atrial junction. The PICC line is ready for immediate use. Electronically Signed   By: Sandi Mariscal M.D.   On: 02/01/2017 17:10   Ir US Guide Vasc Access Right  Result Date: 02/01/2017 INDICATION: Poor venous access. In need of durable intravenous access for medication administration and blood draws. EXAM: ULTRASOUND AND FLUOROSCOPIC GUIDED PICC LINE INSERTION MEDICATIONS: None. CONTRAST:  None FLUOROSCOPY TIME:  6 seconds (1 mGy) COMPLICATIONS: None immediate. TECHNIQUE: The procedure, risks, benefits, and alternatives were explained to the patient and informed written consent was obtained. A timeout was performed prior to the initiation of the procedure. The right upper extremity was prepped with chlorhexidine in a sterile fashion, and a sterile drape was applied covering the operative field. Maximum barrier sterile technique with  sterile gowns and gloves were used for the procedure. A timeout was performed prior to the initiation of the procedure. Local anesthesia was provided with 1% lidocaine. Under direct ultrasound guidance, the brachial vein was accessed with a micropuncture kit after the overlying soft tissues were anesthetized with 1% lidocaine. After the overlying soft tissues were anesthetized, a small venotomy incision was created and a micropuncture kit was utilized to access the right brachial vein. Real-time ultrasound guidance was utilized for vascular access including the acquisition of a permanent ultrasound image documenting patency of the accessed vessel. A guidewire was advanced to the level of the superior caval-atrial junction for measurement purposes and the PICC line was cut to length. A peel-away sheath was placed and a 31 cm, 5 [place], dual lumen was inserted to level of the superior caval-atrial junction. A post procedure spot fluoroscopic was obtained. The catheter easily aspirated and flushed and was sutured in place. A dressing was placed. The patient tolerated the procedure well without immediate post  procedural complication. FINDINGS: After catheter placement, the tip lies within the superior cavoatrial junction. The catheter aspirates and flushes normally and is ready for immediate use. IMPRESSION: Successful ultrasound and fluoroscopic guided placement of a right brachial vein approach, 31 cm, 5 French, dual lumen PICC with tip at the superior caval-atrial junction. The PICC line is ready for immediate use. Electronically Signed   By: Sandi Mariscal M.D.   On: 02/01/2017 17:10   Dg Chest Portable 1 View  Result Date: 01/31/2017 CLINICAL DATA:  DKA. EXAM: PORTABLE CHEST 1 VIEW COMPARISON:  03/22/2016 FINDINGS: The cardiomediastinal silhouette is unremarkable. There is no evidence of focal airspace disease, pulmonary edema, suspicious pulmonary nodule/mass, pleural effusion, or pneumothorax. No acute bony  abnormalities are identified. IMPRESSION: No active disease. Electronically Signed   By: Margarette Canada M.D.   On: 01/31/2017 17:58     Subjective: No new complains.  Discharge Exam: Vitals:   02/05/17 2113 02/06/17 0507  BP: 128/65 133/68  Pulse: 91 85  Resp: 16 20  Temp: 98.3 F (36.8 C) 98.1 F (36.7 C)  SpO2: 100% 100%   Vitals:   02/05/17 1114 02/05/17 1416 02/05/17 2113 02/06/17 0507  BP: (!) 144/69 126/69 128/65 133/68  Pulse: (!) 107 91 91 85  Resp: 14 15 16 20   Temp: (!) 97.3 F (36.3 C) 99.9 F (37.7 C) 98.3 F (36.8 C) 98.1 F (36.7 C)  TempSrc: Axillary Axillary Oral Oral  SpO2: 100% 100% 100% 100%    General: Pt is alert, awake, not in acute distress Cardiovascular: RRR, S1/S2 +, no rubs, no gallops Respiratory: CTA bilaterally, no wheezing, no rhonchi Abdominal: Soft, NT, ND, bowel sounds + Extremities: no edema, no cyanosis    The results of significant diagnostics from this hospitalization (including imaging, microbiology, ancillary and laboratory) are listed below for reference.     Microbiology: Recent Results (from the past 240 hour(s))  MRSA PCR Screening     Status: None   Collection Time: 01/31/17  8:52 PM  Result Value Ref Range Status   MRSA by PCR NEGATIVE NEGATIVE Final    Comment:        The GeneXpert MRSA Assay (FDA approved for NASAL specimens only), is one component of a comprehensive MRSA colonization surveillance program. It is not intended to diagnose MRSA infection nor to guide or monitor treatment for MRSA infections.      Labs: BNP (last 3 results) No results for input(s): BNP in the last 8760 hours. Basic Metabolic Panel:  Recent Labs Lab 02/01/17 1431 02/02/17 0344 02/03/17 0500 02/05/17 0843 02/06/17 0849  NA 141 139 139 137 134*  K 3.9 4.0 4.2 3.3* 5.0  CL 115* 113* 109 98* 107  CO2 17* 18* 22 19* 19*  GLUCOSE 171* 157* 189* 350* 187*  BUN 20 17 12 14 13   CREATININE 1.08* 1.07* 0.95 1.07* 0.92   CALCIUM 8.5* 8.2* 8.3* 9.9 8.3*   Liver Function Tests:  Recent Labs Lab 01/31/17 1334 02/05/17 0843  AST 18 32  ALT 12* 22  ALKPHOS 80 96  BILITOT 1.7* 1.0  PROT 7.7 8.6*  ALBUMIN 4.0 4.4    Recent Labs Lab 01/31/17 1334 02/05/17 0843  LIPASE 20 27   No results for input(s): AMMONIA in the last 168 hours. CBC:  Recent Labs Lab 01/31/17 1334 02/01/17 1431 02/02/17 0900 02/03/17 0830 02/05/17 0843 02/06/17 0849  WBC 18.4* 19.6* 9.1  --  8.5 6.6  NEUTROABS 16.9*  --  6.1  --  7.5  --   HGB 9.3* 9.4* 8.0* 8.7* 10.5* 8.9*  HCT 30.7* 31.0* 25.7* 27.9* 33.4* 28.3*  MCV 72.4* 74.0* 73.4*  --  71.7* 72.6*  PLT 412* 414* 325  --  442* 384   Cardiac Enzymes:  Recent Labs Lab 01/31/17 1334  TROPONINI <0.03   BNP: Invalid input(s): POCBNP CBG:  Recent Labs Lab 02/05/17 0835 02/05/17 1144 02/05/17 1719 02/05/17 2115 02/06/17 0746  GLUCAP 315* 300* 240* 101* 140*   D-Dimer No results for input(s): DDIMER in the last 72 hours. Hgb A1c No results for input(s): HGBA1C in the last 72 hours. Lipid Profile No results for input(s): CHOL, HDL, LDLCALC, TRIG, CHOLHDL, LDLDIRECT in the last 72 hours. Thyroid function studies No results for input(s): TSH, T4TOTAL, T3FREE, THYROIDAB in the last 72 hours.  Invalid input(s): FREET3 Anemia work up No results for input(s): VITAMINB12, FOLATE, FERRITIN, TIBC, IRON, RETICCTPCT in the last 72 hours. Urinalysis    Component Value Date/Time   COLORURINE STRAW (A) 01/31/2017 1509   APPEARANCEUR CLEAR 01/31/2017 1509   LABSPEC 1.022 01/31/2017 1509   PHURINE 5.0 01/31/2017 1509   GLUCOSEU >=500 (A) 01/31/2017 1509   HGBUR LARGE (A) 01/31/2017 1509   BILIRUBINUR NEGATIVE 01/31/2017 1509   BILIRUBINUR neg 01/17/2017 1533   KETONESUR 80 (A) 01/31/2017 1509   PROTEINUR 100 (A) 01/31/2017 1509   UROBILINOGEN 0.2 01/17/2017 1533   UROBILINOGEN 0.2 04/27/2015 1608   NITRITE NEGATIVE 01/31/2017 1509   LEUKOCYTESUR  NEGATIVE 01/31/2017 1509   Sepsis Labs Invalid input(s): PROCALCITONIN,  WBC,  LACTICIDVEN Microbiology Recent Results (from the past 240 hour(s))  MRSA PCR Screening     Status: None   Collection Time: 01/31/17  8:52 PM  Result Value Ref Range Status   MRSA by PCR NEGATIVE NEGATIVE Final    Comment:        The GeneXpert MRSA Assay (FDA approved for NASAL specimens only), is one component of a comprehensive MRSA colonization surveillance program. It is not intended to diagnose MRSA infection nor to guide or monitor treatment for MRSA infections.      Time coordinating discharge: Over 30 minutes  SIGNED:   Charlynne Cousins, MD  [facility] 02/06/2017, 11:19 AM Pager   If 7PM-7AM, please contact night-coverage [email or web address] Password [email or web address]

## 2017-02-07 ENCOUNTER — Ambulatory Visit: Payer: Medicaid Other | Admitting: Family Medicine

## 2017-02-07 ENCOUNTER — Telehealth: Payer: Self-pay

## 2017-02-07 NOTE — Progress Notes (Signed)
Patient was seen on 01/30/17 and was provided the results during the visit.

## 2017-02-07 NOTE — Telephone Encounter (Signed)
[facility] Post-discharge Follow-Up Phone Call:  Date of Discharge: 02/06/17 Principal Discharge Diagnosis(es): gastroparesis, DM  neuropathy Post-discharge Communication: (Clearly document all attempts clearly and date contact made) call placed to # [phone] (M) and a HIPAA compliant voicemail message was left requesting a call back to # [phone] Call Completed: No

## 2017-02-08 ENCOUNTER — Telehealth: Payer: Self-pay

## 2017-02-08 NOTE — Telephone Encounter (Signed)
[facility] Post-discharge Follow-Up Phone Call:  Date of Discharge: 02/06/17 Principal Discharge Diagnosis(es): gastroparesis, DM Post-discharge Communication: (Clearly document all attempts clearly and date contact made) call placed to # [phone] (M) and a HIPAA compliant voicemail message was left requesting a call back to # [phone] Call Completed: no   Message received from Marcelino Scot, [place] noting that she has not been able to reach the patient and may attempt to make a visit to patient today to check on her.  Update provided to Dr Jarold Song about call from D. Timmoms.  Call returned to D. Timmons and voicemail message left noting that this CM was not able to reach the patient again today.

## 2017-02-11 ENCOUNTER — Telehealth: Payer: Self-pay

## 2017-02-11 NOTE — Telephone Encounter (Signed)
[facility] Post-discharge Follow-Up Phone Call:  Date of Discharge: 02/06/17 Principal Discharge Diagnosis(es): gastroparesis, DM Post-discharge Communication: (Clearly document all attempts clearly and date contact made) call placed to the patient Call Completed: Yes                    With Whom: patient Interpreter Needed: No     Please check all that apply:  X  Patient is knowledgeable of his/her condition(s) and/or treatment. X  Patient is caring for self at home.  - She lives with her sister who works during the day.  ? Patient is receiving assist at home from family and/or caregiver. Family and/or caregiver is knowledgeable of patient's condition(s) and/or treatment. X  Patient is receiving home health services. If so, name of agency. - She had been referred to [facility] prior to her hospitalization but she has not had a home visit.       Medication Reconciliation:  ? Medication list reviewed with patient. X  Patient obtained all discharge medications. If not, why? - she stated that she has all medications except the B12 and is taking them as ordered.  She said that she did not have any questions about her medications and did not need to review the list.    Activities of Daily Living:  X  Independent - has glucometer ? Needs assist (describe; ? home DME used) ? [facility] (describe, ? home DME used)   Community resources in place for patient:  ? None  X  Home Health/Home DME - referral had been made to [facility] prior to her hospitalization.  The patient said that she thinks they have been trying to contact her. Provided her with the phone # for [facility] # [phone] and she said that she would call then.  Also informed her that Marcelino Scot, [facility] has been trying to contact her. Explained the services that [facility] can provide and how those services may benefit her.  ? Assisted Living ? Support Group          Patient Education: the patient denied any  nausea/vomiting since she has been home and said that she is feeling " okay."   She inquired about the psychiatry referral. Informed her that a referral was placed to Dr Darleene Cleaver. She said that she has been in the hospital and would call and may have missed a call. She said that she would call his office to try to schedule an appointment.  Provided her with the phone # for his office # [phone].         Questions/Concerns discussed: She said that she needed to cancel her appointment at [facility] tomorrow. Appointment rescheduled to 02/20/17 @ 1400.  She said that she would arrange medicaid  transportation to the clinic.  This CM explained that this clinic has been trying to contact her as well as community agencies.  She stated that she doesn't answer the phone when she is not feeling well. She needs to have it quiet at that time.    Update provided to Marcelino Scot, [facility] who stated that she would try to call the patient this afternoon.

## 2017-02-11 NOTE — Telephone Encounter (Signed)
Call received from Marcelino Scot, [facility]/Clinical Liaison, reporting that she went to the patient's house this morning and knocked on the door multiple times and waited and there was no answer.  She stated that the house appeared to be locked up

## 2017-02-12 ENCOUNTER — Telehealth: Payer: Self-pay | Admitting: Family Medicine

## 2017-02-12 ENCOUNTER — Inpatient Hospital Stay: Payer: Self-pay | Admitting: Family Medicine

## 2017-02-12 NOTE — Telephone Encounter (Signed)
[facility] call to informed you that the Pt has Home health schedule for 02/12/17

## 2017-02-13 ENCOUNTER — Telehealth: Payer: Self-pay

## 2017-02-13 NOTE — Telephone Encounter (Signed)
Spoke to Marcelino Scot, [place] who stated that she was able to meet with the patient and plans to follow up with her again.  She will stress the importance of follow up with a mental health provider. Informed her that a referral was made to Dr Darleene Cleaver and the patient stated that she wanted to call him to make an appointment.

## 2017-02-15 ENCOUNTER — Telehealth: Payer: Self-pay

## 2017-02-15 NOTE — Telephone Encounter (Signed)
Call received from Marcelino Scot, [place] noting that she called the patient x 4 to check on her and has not been able to reach her. She will continue to try to contact her to check on her

## 2017-02-18 ENCOUNTER — Telehealth: Payer: Self-pay

## 2017-02-18 NOTE — Telephone Encounter (Signed)
Call placed to Dr Marquis Buggy office to inquire if the patient has scheduled an appointment.  As per [name], they have not yet received the referral despite a fax confirmation being received by [facility]. The referral was re-faxed to his office. Fax # confirmed with [facility] - fax # [phone]

## 2017-02-19 ENCOUNTER — Telehealth: Payer: Self-pay

## 2017-02-19 NOTE — Telephone Encounter (Signed)
Attempted to contact the patient to check on her and remind her of her appointment tomorrow - 02/20/17 @ 1400.  Call placed to # [phone] (M) and a HIPAA compliant voicemail message was left requesting a call back to # [phone].

## 2017-02-20 ENCOUNTER — Telehealth: Payer: Self-pay | Admitting: Family Medicine

## 2017-02-20 ENCOUNTER — Inpatient Hospital Stay: Payer: Self-pay | Admitting: Family Medicine

## 2017-02-20 NOTE — Telephone Encounter (Signed)
Call placed to Dr. Delfin Gant office [phone] regarding patient's referral that was faxed. Spoke with [place] and she stated that they received it. They contacted patient for an appointment on 02/08/18 but patient didn't answer. They left patient a message. Latoya stated that patient can call the office to schedule her appointment.

## 2017-02-26 ENCOUNTER — Telehealth: Payer: Self-pay | Admitting: Family Medicine

## 2017-02-26 NOTE — Telephone Encounter (Signed)
Call placed to patient # [phone], regarding her status and follow up appointments that she needs to make. The phone rang for a few times and then call dropped. Will try again in an hour.

## 2017-02-28 ENCOUNTER — Telehealth: Payer: Self-pay | Admitting: Family Medicine

## 2017-02-28 NOTE — Telephone Encounter (Signed)
Call placed to patient [phone], regarding her status and follow up appointments that she needs to make. No answer. I left patient a message to return my call at [phone].

## 2017-03-01 ENCOUNTER — Telehealth: Payer: Self-pay | Admitting: Family Medicine

## 2017-03-01 NOTE — Telephone Encounter (Signed)
Call placed to patient [phone], regarding her status and appointments. No answer. Left patient a message to return my call at [phone].

## 2017-03-05 ENCOUNTER — Inpatient Hospital Stay (HOSPITAL_COMMUNITY)
Admission: EM | Admit: 2017-03-05 | Discharge: 2017-03-09 | DRG: 638 | Disposition: A | Discharge status: Home / Self Care | Payer: Medicaid Other | Attending: Internal Medicine | Admitting: Internal Medicine

## 2017-03-05 ENCOUNTER — Encounter (HOSPITAL_COMMUNITY): Payer: Self-pay | Admitting: *Deleted

## 2017-03-05 ENCOUNTER — Telehealth: Payer: Self-pay | Admitting: Family Medicine

## 2017-03-05 DIAGNOSIS — Z880 Allergy status to penicillin: Secondary | ICD-10-CM | POA: Diagnosis not present

## 2017-03-05 DIAGNOSIS — I10 Essential (primary) hypertension: Secondary | ICD-10-CM | POA: Diagnosis present

## 2017-03-05 DIAGNOSIS — K219 Gastro-esophageal reflux disease without esophagitis: Secondary | ICD-10-CM | POA: Diagnosis present

## 2017-03-05 DIAGNOSIS — Z79899 Other long term (current) drug therapy: Secondary | ICD-10-CM | POA: Diagnosis not present

## 2017-03-05 DIAGNOSIS — F329 Major depressive disorder, single episode, unspecified: Secondary | ICD-10-CM | POA: Diagnosis present

## 2017-03-05 DIAGNOSIS — E1043 Type 1 diabetes mellitus with diabetic autonomic (poly)neuropathy: Secondary | ICD-10-CM | POA: Diagnosis present

## 2017-03-05 DIAGNOSIS — E1069 Type 1 diabetes mellitus with other specified complication: Secondary | ICD-10-CM | POA: Diagnosis present

## 2017-03-05 DIAGNOSIS — R7989 Other specified abnormal findings of blood chemistry: Secondary | ICD-10-CM | POA: Diagnosis present

## 2017-03-05 DIAGNOSIS — Z885 Allergy status to narcotic agent status: Secondary | ICD-10-CM | POA: Diagnosis not present

## 2017-03-05 DIAGNOSIS — Z9119 Patient's noncompliance with other medical treatment and regimen: Secondary | ICD-10-CM

## 2017-03-05 DIAGNOSIS — K319 Disease of stomach and duodenum, unspecified: Secondary | ICD-10-CM | POA: Diagnosis present

## 2017-03-05 DIAGNOSIS — E861 Hypovolemia: Secondary | ICD-10-CM | POA: Diagnosis present

## 2017-03-05 DIAGNOSIS — E785 Hyperlipidemia, unspecified: Secondary | ICD-10-CM | POA: Diagnosis present

## 2017-03-05 DIAGNOSIS — Z452 Encounter for adjustment and management of vascular access device: Secondary | ICD-10-CM

## 2017-03-05 DIAGNOSIS — R05 Cough: Secondary | ICD-10-CM

## 2017-03-05 DIAGNOSIS — E1010 Type 1 diabetes mellitus with ketoacidosis without coma: Secondary | ICD-10-CM | POA: Diagnosis present

## 2017-03-05 DIAGNOSIS — E1143 Type 2 diabetes mellitus with diabetic autonomic (poly)neuropathy: Secondary | ICD-10-CM | POA: Diagnosis present

## 2017-03-05 DIAGNOSIS — D509 Iron deficiency anemia, unspecified: Secondary | ICD-10-CM | POA: Diagnosis present

## 2017-03-05 DIAGNOSIS — N179 Acute kidney failure, unspecified: Secondary | ICD-10-CM | POA: Diagnosis present

## 2017-03-05 DIAGNOSIS — Z888 Allergy status to other drugs, medicaments and biological substances status: Secondary | ICD-10-CM | POA: Diagnosis not present

## 2017-03-05 DIAGNOSIS — R197 Diarrhea, unspecified: Secondary | ICD-10-CM | POA: Diagnosis present

## 2017-03-05 DIAGNOSIS — K3184 Gastroparesis: Secondary | ICD-10-CM | POA: Diagnosis present

## 2017-03-05 DIAGNOSIS — Z833 Family history of diabetes mellitus: Secondary | ICD-10-CM | POA: Diagnosis not present

## 2017-03-05 DIAGNOSIS — E1040 Type 1 diabetes mellitus with diabetic neuropathy, unspecified: Secondary | ICD-10-CM | POA: Diagnosis present

## 2017-03-05 DIAGNOSIS — R059 Cough, unspecified: Secondary | ICD-10-CM

## 2017-03-05 LAB — GLUCOSE, CAPILLARY
GLUCOSE-CAPILLARY: 132 mg/dL — AB (ref 65–99)
GLUCOSE-CAPILLARY: 191 mg/dL — AB (ref 65–99)
Glucose-Capillary: 229 mg/dL — ABNORMAL HIGH (ref 65–99)
Glucose-Capillary: 151 mg/dL — ABNORMAL HIGH (ref 65–99)

## 2017-03-05 LAB — COMPREHENSIVE METABOLIC PANEL
ALBUMIN: 4 g/dL (ref 3.5–5.0)
ALK PHOS: 94 U/L (ref 38–126)
ALT: 17 U/L (ref 14–54)
AST: 30 U/L (ref 15–41)
Anion gap: 26 — ABNORMAL HIGH (ref 5–15)
BILIRUBIN TOTAL: 1.4 mg/dL — AB (ref 0.3–1.2)
BUN: 22 mg/dL — AB (ref 6–20)
CALCIUM: 9.7 mg/dL (ref 8.9–10.3)
CO2: 9 mmol/L — AB (ref 22–32)
CREATININE: 1.48 mg/dL — AB (ref 0.44–1.00)
Chloride: 101 mmol/L (ref 101–111)
GFR calc Af Amer: 47 mL/min — ABNORMAL LOW (ref >60)
GFR calc non Af Amer: 40 mL/min — ABNORMAL LOW (ref >60)
GLUCOSE: 532 mg/dL — AB (ref 65–99)
Potassium: 4.5 mmol/L (ref 3.5–5.1)
Sodium: 136 mmol/L (ref 135–145)
TOTAL PROTEIN: 7.9 g/dL (ref 6.5–8.1)

## 2017-03-05 LAB — CBC
HCT: 33 % — ABNORMAL LOW (ref 36.0–46.0)
Hemoglobin: 9.7 g/dL — ABNORMAL LOW (ref 12.0–15.0)
MCH: 21.7 pg — AB (ref 26.0–34.0)
MCHC: 29.4 g/dL — AB (ref 30.0–36.0)
MCV: 74 fL — ABNORMAL LOW (ref 78.0–100.0)
PLATELETS: 464 10*3/uL — AB (ref 150–400)
RBC: 4.46 MIL/uL (ref 3.87–5.11)
RDW: 16.5 % — AB (ref 11.5–15.5)
WBC: 21.2 10*3/uL — ABNORMAL HIGH (ref 4.0–10.5)

## 2017-03-05 LAB — MRSA PCR SCREENING: MRSA by PCR: NEGATIVE

## 2017-03-05 LAB — BASIC METABOLIC PANEL
Anion gap: 16 — ABNORMAL HIGH (ref 5–15)
BUN: 20 mg/dL (ref 6–20)
CHLORIDE: 114 mmol/L — AB (ref 101–111)
CO2: 11 mmol/L — AB (ref 22–32)
CREATININE: 1.29 mg/dL — AB (ref 0.44–1.00)
Calcium: 9 mg/dL (ref 8.9–10.3)
GFR calc non Af Amer: 48 mL/min — ABNORMAL LOW (ref >60)
GFR, EST AFRICAN AMERICAN: 55 mL/min — AB (ref >60)
GLUCOSE: 312 mg/dL — AB (ref 65–99)
Potassium: 4.4 mmol/L (ref 3.5–5.1)
Sodium: 141 mmol/L (ref 135–145)

## 2017-03-05 LAB — CBG MONITORING, ED
GLUCOSE-CAPILLARY: 497 mg/dL — AB (ref 65–99)
GLUCOSE-CAPILLARY: 377 mg/dL — AB (ref 65–99)
GLUCOSE-CAPILLARY: 299 mg/dL — AB (ref 65–99)
Glucose-Capillary: 516 mg/dL (ref 65–99)
Glucose-Capillary: 493 mg/dL — ABNORMAL HIGH (ref 65–99)

## 2017-03-05 LAB — URINALYSIS, ROUTINE W REFLEX MICROSCOPIC
Bacteria, UA: NONE SEEN
Bilirubin Urine: NEGATIVE
Ketones, ur: 80 mg/dL — AB
Leukocytes, UA: NEGATIVE
Nitrite: NEGATIVE
PH: 5 (ref 5.0–8.0)
Protein, ur: 30 mg/dL — AB
Specific Gravity, Urine: 1.017 (ref 1.005–1.030)

## 2017-03-05 LAB — HCG, QUANTITATIVE, PREGNANCY: hCG, Beta Chain, Quant, S: 2 m[IU]/mL (ref <5)

## 2017-03-05 LAB — LIPASE, BLOOD: Lipase: 22 U/L (ref 11–51)

## 2017-03-05 MED ORDER — DEXTROSE-NACL 5-0.45 % IV SOLN: INTRAVENOUS | Status: DC

## 2017-03-05 MED ORDER — ONDANSETRON HCL 4 MG PO TABS
4.0000 mg | ORAL_TABLET | Freq: Four times a day (QID) | ORAL | Status: DC | PRN
Start: 2017-03-05 — End: 2017-03-06

## 2017-03-05 MED ORDER — HALOPERIDOL LACTATE 5 MG/ML IJ SOLN
5.0000 mg | Freq: Once | INTRAMUSCULAR | Status: AC
  Administered 2017-03-05: 5 mg via INTRAVENOUS
  Filled 2017-03-05: qty 1

## 2017-03-05 MED ORDER — ONDANSETRON HCL 4 MG/2ML IJ SOLN
4.0000 mg | Freq: Once | INTRAMUSCULAR | Status: AC
  Administered 2017-03-05: 4 mg via INTRAVENOUS
  Filled 2017-03-05: qty 2

## 2017-03-05 MED ORDER — ACETAMINOPHEN 325 MG PO TABS: 650.0000 mg | ORAL_TABLET | Freq: Four times a day (QID) | ORAL | Status: DC | PRN

## 2017-03-05 MED ORDER — SODIUM CHLORIDE 0.9 % IV BOLUS (SEPSIS)
1000.0000 mL | Freq: Once | INTRAVENOUS | Status: AC
  Administered 2017-03-05: 1000 mL via INTRAVENOUS

## 2017-03-05 MED ORDER — POTASSIUM CHLORIDE 10 MEQ/100ML IV SOLN: 10.0000 meq | INTRAVENOUS | Status: DC

## 2017-03-05 MED ORDER — METOCLOPRAMIDE HCL 5 MG/ML IJ SOLN
5.0000 mg | Freq: Four times a day (QID) | INTRAMUSCULAR | Status: DC
  Administered 2017-03-05 – 2017-03-06 (×3): 5 mg via INTRAVENOUS
  Filled 2017-03-05 (×3): qty 2

## 2017-03-05 MED ORDER — SODIUM CHLORIDE 0.9 % IV SOLN
INTRAVENOUS | Status: DC
  Administered 2017-03-05: 16:00:00 via INTRAVENOUS

## 2017-03-05 MED ORDER — SODIUM CHLORIDE 0.9 % IV BOLUS (SEPSIS)
1000.0000 mL | Freq: Once | INTRAVENOUS | Status: AC
Start: 2017-03-05 — End: 2017-03-05
  Administered 2017-03-05: 1000 mL via INTRAVENOUS

## 2017-03-05 MED ORDER — ONDANSETRON HCL 4 MG/2ML IJ SOLN
4.0000 mg | Freq: Four times a day (QID) | INTRAMUSCULAR | Status: DC | PRN
  Administered 2017-03-05 – 2017-03-06 (×2): 4 mg via INTRAVENOUS
  Filled 2017-03-05 (×2): qty 2

## 2017-03-05 MED ORDER — HEPARIN SODIUM (PORCINE) 5000 UNIT/ML IJ SOLN
5000.0000 [IU] | Freq: Three times a day (TID) | INTRAMUSCULAR | Status: DC
  Administered 2017-03-05 – 2017-03-09 (×11): 5000 [IU] via SUBCUTANEOUS
  Filled 2017-03-05 (×11): qty 1

## 2017-03-05 MED ORDER — DEXTROSE-NACL 5-0.45 % IV SOLN
INTRAVENOUS | Status: DC
  Administered 2017-03-06 (×2): via INTRAVENOUS

## 2017-03-05 MED ORDER — SODIUM CHLORIDE 0.9 % IV SOLN
INTRAVENOUS | Status: DC
  Administered 2017-03-05: 4.3 [IU]/h via INTRAVENOUS
  Filled 2017-03-05: qty 1

## 2017-03-05 MED ORDER — POTASSIUM CHLORIDE CRYS ER 20 MEQ PO TBCR
20.0000 meq | EXTENDED_RELEASE_TABLET | Freq: Once | ORAL | Status: AC
  Administered 2017-03-05: 20 meq via ORAL
  Filled 2017-03-05: qty 1

## 2017-03-05 MED ORDER — ACETAMINOPHEN 650 MG RE SUPP: 650.0000 mg | Freq: Four times a day (QID) | RECTAL | Status: DC | PRN

## 2017-03-05 MED ORDER — SODIUM CHLORIDE 0.9% FLUSH
3.0000 mL | Freq: Two times a day (BID) | INTRAVENOUS | Status: DC
  Administered 2017-03-05 – 2017-03-08 (×5): 3 mL via INTRAVENOUS

## 2017-03-05 MED ORDER — SODIUM CHLORIDE 0.9 % IV SOLN: INTRAVENOUS | Status: DC

## 2017-03-05 MED ORDER — SODIUM CHLORIDE 0.9 % IV SOLN
INTRAVENOUS | Status: DC
  Administered 2017-03-06: 10:00:00 via INTRAVENOUS
  Filled 2017-03-05 (×4): qty 1

## 2017-03-05 MED ORDER — POTASSIUM CHLORIDE 10 MEQ/100ML IV SOLN
10.0000 meq | INTRAVENOUS | Status: DC
  Filled 2017-03-05: qty 100

## 2017-03-05 NOTE — Progress Notes (Signed)
Inpatient Diabetes Program Recommendations  AACE/ADA: New Consensus Statement on Inpatient Glycemic Control (2015)  Target Ranges:  Prepandial:   less than 140 mg/dL      Peak postprandial:   less than 180 mg/dL (1-2 hours)      Critically ill patients:  140 - 180 mg/dL   Lab Results  Component Value Date   OVZCHY 850 (HH) 03/05/2017   HGBA1C 10.1 01/17/2017    Review of Glycemic Control  Diabetes history: DM1 Outpatient Diabetes medications: Levemir 25 units bid, Novolog 0-12 units tidwc Current orders for Inpatient glycemic control: IV insulin drip  Inpatient Diabetes Program Recommendations:  Noted patient currently in ED. Last seen by DM coordinator 02/01/17.  CO2 9 Anion gap 26 Will follow during hospitalization.  Thank you, Nani Gasser. Anyely Cunning, RN, MSN, CDE  Diabetes Coordinator Inpatient Glycemic Control Team Team Pager [phone] (8am-5pm) 03/05/2017 2:33 PM

## 2017-03-05 NOTE — ED Triage Notes (Signed)
Pt arrived to ED via [facility] c/o NVD.

## 2017-03-05 NOTE — ED Notes (Signed)
Bed: WA15 Expected date:  Expected time:  Means of arrival:  Comments: EMS/hyperglycemia 

## 2017-03-05 NOTE — ED Notes (Signed)
IV team at bedside 

## 2017-03-05 NOTE — ED Notes (Signed)
US guided IV attempt x2 unsuccessful. Gabriel Rainwater, RN verbalizes will attempt IV start.

## 2017-03-05 NOTE — ED Notes (Signed)
IV team unable to establish IV.

## 2017-03-05 NOTE — Telephone Encounter (Signed)
Call placed to patient [phone] regarding her appointments and to check on her status. No answer. Left a message to return my call at [phone].

## 2017-03-05 NOTE — ED Notes (Signed)
Schlossman, MD verbalizes will start 2nd IV and draw venous blood gas.

## 2017-03-05 NOTE — ED Notes (Signed)
Billy Fischer, MD attempted US guided IV x4 without success. Plan to have respiratory draw arterial venous venous as ordered.

## 2017-03-05 NOTE — ED Provider Notes (Signed)
[facility] DEPT Provider Note   CSN: [id number] Arrival date & time: 03/05/17  1134     History   Chief Complaint Chief Complaint  Patient presents with  . Nausea  . Vomiting  . Diarrhea    HPI [name] is a 51 y.o. female.  HPI   51 year old female with a history of diabetes, hypertension, hyperlipidemia, gastroparesis, presents with concern for nausea, vomiting and abdominal pain. Patient with severe nausea, and vomiting of my initial evaluation and initial history is limited. She just states "gastroparesis", shakes head to fever, urinary symptoms.  She reports later no cough, no dysuria, no chest pain or dyspnea, no fevers. Does report some diarrhea. Missed her insulin dose last night but reports taking it prior. Symptoms started last night. Thought it was consistent with gastroparesis, also reports panic attacks.   Past Medical History:  Diagnosis Date  . Depression   . Diabetes mellitus without complication ([place])   . Essential hypertension   . Gastroparesis   . GERD (gastroesophageal reflux disease)   . HLD (hyperlipidemia)     Patient Active Problem List   Diagnosis Date Noted  . Diarrhea 03/05/2017  . Hypokalemia 02/06/2017  . Intractable nausea and vomiting 02/05/2017  . Gastroparesis 04/20/2016  . Type 1 diabetes mellitus with ketoacidosis without coma ([place])   . DKA (diabetic ketoacidoses) ([place]) 03/18/2016  . Noncompliance with treatment plan 03/13/2016  . Essential hypertension 01/24/2016  . Leukocytosis 01/24/2016  . Abnormal ECG 11/27/2015  . HLD (hyperlipidemia)   . GERD (gastroesophageal reflux disease)   . Metabolic acidosis, normal anion gap (NAG)   . AKI (acute kidney injury) ([place])   . Anemia, iron deficiency   . DKA, type 1, not at goal [facility])   . Hyponatremia 09/02/2015  . Vitamin B12 deficiency 08/16/2015  . Lactic acidosis 07/29/2015  . Acute pyelonephritis 07/29/2015  . Pyelonephritis   . Hypothermia   . Diabetic gastroparesis  ([place])   . Diabetic neuropathy, type I diabetes mellitus ([place]) 05/18/2015  . Anemia 05/18/2015  . Anxiety and depression 05/18/2015  . Nausea & vomiting   . Diabetic ketoacidosis without coma associated with type 1 diabetes mellitus ([place])   . High anion gap metabolic acidosis 37/03/6268    Past Surgical History:  Procedure Laterality Date  . COLONOSCOPY  09/27/2014   at [facility]  . ESOPHAGOGASTRODUODENOSCOPY  09/27/2014   at [facility], Dr Rolan Lipa. biospy neg for celiac, neg for H pylori.   . EYE SURGERY    . gailstones    . IR FLUORO GUIDE CV LINE RIGHT  02/01/2017  . IR GENERIC HISTORICAL  01/24/2016   IR FLUORO GUIDE CV LINE RIGHT 01/24/2016 Darrell K Allred, PA-C [facility]-INTERV RAD  . IR GENERIC HISTORICAL  01/24/2016   IR US GUIDE VASC ACCESS RIGHT 01/24/2016 Darrell K Allred, PA-C [facility]-INTERV RAD  . IR US GUIDE VASC ACCESS RIGHT  02/01/2017  . POSTERIOR VITRECTOMY AND MEMBRANE PEEL-LEFT EYE  09/28/2002  . POSTERIOR VITRECTOMY AND MEMBRANE PEEL-RIGHT EYE  03/16/2002  . RETINAL DETACHMENT SURGERY      OB History    No data available       Home Medications    Prior to Admission medications   Medication Sig Start Date End Date Taking? Authorizing Provider  albuterol (PROVENTIL HFA;VENTOLIN HFA) 108 (90 Base) MCG/ACT inhaler Inhale 2 puffs into the lungs every 6 (six) hours as needed for wheezing or shortness of breath. 10/20/15  Yes [name], MD  atorvastatin (LIPITOR) 40 MG  tablet Take 1 tablet (40 mg total) by mouth daily. 10/30/16  Yes [name], MD  busPIRone (BUSPAR) 10 MG tablet Take 1 tablet (10 mg total) by mouth 2 (two) times daily. 10/30/16  Yes [name], MD  famotidine (PEPCID) 20 MG tablet Take 1 tablet (20 mg total) by mouth 2 (two) times daily. 09/29/16  Yes Patrecia Pour, Christean Grief, MD  ferrous sulfate 325 (65 FE) MG tablet Take 1 tablet (325 mg total) by mouth daily with breakfast. 03/27/16  Yes Donne Hazel, MD  FLUoxetine (PROZAC) 20 MG tablet Take 2 tablets (40 mg total)  by mouth daily. 10/30/16  Yes [name], MD  furosemide (LASIX) 40 MG tablet Take 40 mg by mouth daily as needed for fluid.    Yes [provider]  insulin aspart (NOVOLOG) 100 UNIT/ML injection Inject 0-12 Units into the skin 3 (three) times daily with meals. Per sliding scale 10/30/16  Yes Amao, [name], MD  insulin detemir (LEVEMIR) 100 UNIT/ML injection Inject 0.16 mLs (16 Units total) into the skin 2 (two) times daily. 02/03/17  Yes Eugenie Filler, MD  metoCLOPramide (REGLAN) 5 MG/5ML solution Take 10 mLs (10 mg total) by mouth 4 (four) times daily -  before meals and at bedtime. 02/06/17  Yes Charlynne Cousins, MD  ondansetron (ZOFRAN ODT) 4 MG disintegrating tablet Take 1 tablet (4 mg total) by mouth every 8 (eight) hours as needed for nausea or vomiting. 09/04/16  Yes [name], MD  polyethylene glycol (MIRALAX / GLYCOLAX) packet Take 17 g by mouth daily as needed for mild constipation. 09/04/16  Yes [name], MD  pregabalin (LYRICA) 150 MG capsule Take 1 capsule (150 mg total) by mouth 2 (two) times daily. 01/17/17  Yes Argentina Donovan, PA-C  vitamin B-12 (CYANOCOBALAMIN) 1000 MCG tablet Take 1 tablet (1,000 mcg total) by mouth daily. 02/03/17  Yes Eugenie Filler, MD  Blood Glucose Monitoring Suppl (ACCU-CHEK AVIVA) device Use as instructed 3 times daily before meals 10/30/16   [name], MD  glucose blood (ACCU-CHEK AVIVA) test strip Use as instructed 3 times daily before meals 10/30/16   [name], MD  Insulin Syringe-Needle U-100 (BD INSULIN SYRINGE ULTRAFINE) 31G X 15/64" 0.5 ML MISC 1 each by Does not apply route 4 (four) times daily as needed. 08/16/15   [name], MD  Lancet Devices [facility]) lancets Use as instructed 3 times daily before meals 10/30/16   [name], MD  LORazepam (ATIVAN) 1 MG tablet Take 1 tablet (1 mg total) by mouth every 8 (eight) hours as needed for anxiety. Patient not taking: Reported on 01/30/2017 01/09/17   Varney Biles, MD  promethazine (PHENERGAN) 25 MG tablet Take 1 tablet (25 mg total) by mouth every 6 (six) hours as needed for nausea. Patient not taking: Reported on 01/31/2017 01/09/17   Varney Biles, MD    Family History Family History  Problem Relation Age of Onset  . Cystic fibrosis Mother   . Hypertension Father   . Diabetes Brother   . Hypertension Maternal Grandmother     Social History Social History  Substance Use Topics  . Smoking status: Never Smoker  . Smokeless tobacco: Never Used  . Alcohol use No     Allergies   Anesthetics, amide; Penicillins; Buprenorphine hcl; and Encainide   Review of Systems Review of Systems  Constitutional: Positive for fatigue. Negative for fever.  HENT: Negative for sore throat.   Eyes: Negative for visual disturbance.  Respiratory:  Negative for cough and shortness of breath.   Cardiovascular: Negative for chest pain.  Gastrointestinal: Positive for abdominal pain, nausea and vomiting.  Genitourinary: Negative for difficulty urinating and dysuria.  Musculoskeletal: Negative for back pain and neck pain.  Skin: Negative for rash.  Neurological: Negative for syncope and headaches.     Physical Exam Updated Vital Signs BP 131/71   Pulse (!) 113   Temp 98.4 F (36.9 C) (Oral)   Resp 18   LMP 02/05/2017   SpO2 100%   Physical Exam  Constitutional: She is oriented to person, place, and time. She appears well-developed and well-nourished. She has a sickly appearance. She appears ill. No distress.  HENT:  Head: Normocephalic and atraumatic.  Eyes: Conjunctivae and EOM are normal.  Neck: Normal range of motion.  Cardiovascular: Regular rhythm, normal heart sounds and intact distal pulses.  Tachycardia present.  Exam reveals no gallop and no friction rub.   No murmur heard. Pulmonary/Chest: Effort normal and breath sounds normal. No respiratory distress. She has no wheezes. She has no rales.  Abdominal: Soft. She exhibits no  distension. There is tenderness (mild epigastric). There is no guarding.  Musculoskeletal: She exhibits no edema or tenderness.  Neurological: She is alert and oriented to person, place, and time.  Skin: Skin is warm and dry. No rash noted. She is not diaphoretic. No erythema.  Nursing note and vitals reviewed.    ED Treatments / Results  Labs (all labs ordered are listed, but only abnormal results are displayed) Labs Reviewed  COMPREHENSIVE METABOLIC PANEL - Abnormal; Notable for the following:       Result Value   CO2 9 (*)    Glucose, Bld 532 (*)    BUN 22 (*)    Creatinine, Ser 1.48 (*)    Total Bilirubin 1.4 (*)    GFR calc non Af Amer 40 (*)    GFR calc Af Amer 47 (*)    Anion gap 26 (*)    All other components within normal limits  CBC - Abnormal; Notable for the following:    WBC 21.2 (*)    Hemoglobin 9.7 (*)    HCT 33.0 (*)    MCV 74.0 (*)    MCH 21.7 (*)    MCHC 29.4 (*)    RDW 16.5 (*)    Platelets 464 (*)    All other components within normal limits  BLOOD GAS, ARTERIAL - Abnormal; Notable for the following:    pH, Arterial 7.188 (*)    pCO2 arterial 25.4 (*)    pO2, Arterial 113 (*)    Bicarbonate 9.3 (*)    Acid-base deficit 17.4 (*)    All other components within normal limits  CBG MONITORING, ED - Abnormal; Notable for the following:    Glucose-Capillary 516 (*)    All other components within normal limits  CBG MONITORING, ED - Abnormal; Notable for the following:    Glucose-Capillary 493 (*)    All other components within normal limits  CBG MONITORING, ED - Abnormal; Notable for the following:    Glucose-Capillary 497 (*)    All other components within normal limits  CBG MONITORING, ED - Abnormal; Notable for the following:    Glucose-Capillary 377 (*)    All other components within normal limits  CBG MONITORING, ED - Abnormal; Notable for the following:    Glucose-Capillary 299 (*)    All other components within normal limits  URINE CULTURE    MRSA PCR  SCREENING  C DIFFICILE QUICK SCREEN W PCR REFLEX  LIPASE, BLOOD  HCG, QUANTITATIVE, PREGNANCY  URINALYSIS, ROUTINE W REFLEX MICROSCOPIC  BASIC METABOLIC PANEL  BASIC METABOLIC PANEL  BASIC METABOLIC PANEL  BASIC METABOLIC PANEL  CBC    EKG  EKG Interpretation None       Radiology No results found.  Procedures Procedures (including critical care time)  Medications Ordered in ED Medications  0.9 %  sodium chloride infusion ( Intravenous Rate/Dose Change 03/05/17 1845)  dextrose 5 %-0.45 % sodium chloride infusion ( Intravenous Hold 03/05/17 1845)  insulin regular (NOVOLIN R,HUMULIN R) 100 Units in sodium chloride 0.9 % 100 mL (1 Units/mL) infusion ( Intravenous Duplicate 4/58/09 9833)  metoCLOPramide (REGLAN) injection 5 mg (not administered)  heparin injection 5,000 Units (not administered)  sodium chloride flush (NS) 0.9 % injection 3 mL (not administered)  acetaminophen (TYLENOL) tablet 650 mg (not administered)    Or  acetaminophen (TYLENOL) suppository 650 mg (not administered)  ondansetron (ZOFRAN) tablet 4 mg (not administered)    Or  ondansetron (ZOFRAN) injection 4 mg (not administered)  potassium chloride 10 mEq in 100 mL IVPB (not administered)  haloperidol lactate (HALDOL) injection 5 mg (5 mg Intravenous Given 03/05/17 1349)  sodium chloride 0.9 % bolus 1,000 mL (0 mLs Intravenous Stopped 03/05/17 1553)  sodium chloride 0.9 % bolus 1,000 mL (0 mLs Intravenous Stopped 03/05/17 1553)  ondansetron (ZOFRAN) injection 4 mg (4 mg Intravenous Given 03/05/17 1716)  potassium chloride SA (K-DUR,KLOR-CON) CR tablet 20 mEq (20 mEq Oral Given 03/05/17 1744)   CRITICAL CARE: DKA Performed by: Alvino Chapel   Total critical care time: 30 minutes  Critical care time was exclusive of separately billable procedures and treating other patients.  Critical care was necessary to treat or prevent imminent or life-threatening deterioration.  Critical  care was time spent personally by me on the following activities: development of treatment plan with patient and/or surrogate as well as nursing, discussions with consultants, evaluation of patient's response to treatment, examination of patient, obtaining history from patient or surrogate, ordering and performing treatments and interventions, ordering and review of laboratory studies, ordering and review of radiographic studies, pulse oximetry and re-evaluation of patient's condition.   Initial Impression / Assessment and Plan / ED Course  I have reviewed the triage vital signs and the nursing notes.  Pertinent labs & imaging results that were available during my care of the patient were reviewed by me and considered in my medical decision making (see chart for details).     51 year old female with a history of diabetes, hypertension, hyperlipidemia, gastroparesis, presents with concern for nausea, vomiting and abdominal pain.  Abdominal exam benign, we'll suspicion for appendicitis.  She does not have abdominal distention, is having diarrhea, doubt acute SBO. Has hx of cholecystectomy. No sign of pancreatitis or transaminitis on labs.  Patient reported that symptoms were secondary to gastroparesis, she is given Haldol with some improvement. Labs returned showing concern for DKA. Bicarbonate is 9, AG 26, hyperglycemia,  pH is 7.1. Urinalysis pending.  Staff continuing to work on obtaining urinalysis to evaluate for signs of infection and confirmation of ketonuria.    Initiated  insulin drip. Unclear etiology of DKA. Patient does report she missed insulin dose last night's, and symptoms that brought on by gastroparesis combined with missing insulin, versus other viral etiology. Patient has significant leukocytosis, which I believe is secondary to DKA and possible viral etiology. Urinalysis pending but I doubt sepsis at this time.  Admitted to hospitalist for further care.     Final Clinical  Impressions(s) / ED Diagnoses   Final diagnoses:  Diabetic ketoacidosis without coma associated with type 1 diabetes mellitus [facility])    New Prescriptions Current Discharge Medication List       Gareth Morgan, MD 03/05/17 [phone]

## 2017-03-05 NOTE — ED Notes (Addendum)
Pt is very anxious and states "I'm having a panic attack." This Probation officer used therapeutic discussion to calm pt. Pt aware orders placed to provide treatment. EMS gave sublingual zofran; pt dry heaving at present time; awaiting MD assessment/other medication options.

## 2017-03-05 NOTE — H&P (Signed)
[facility] History and Physical  Catina Nuss IWP:[id number] DOB: 06/02/1966 DOA: 03/05/2017   PCP: [name], MD  Specialists: None  Chief Complaint: Nausea and vomiting and diarrhea since yesterday  HPI: [name] is a 51 y.o. female with a past medical history of type 1 diabetes, diabetic gastropathy, diabetic neuropathy, who was last hospitalized for DKA in August. Her HbA1c was 10.1 in July. Patient was in her usual state of health yesterday when she started having loose stools. This was followed by onset of nausea, vomiting. She's had innumerable episodes of emesis. She also complains of abdominal pain, mainly associated with vomiting. Denies any blood in the emesis or stool. Denies any cough, sore throat, skin rashes, joint pains. No dizziness or lightheadedness. Has been very fatigued. She did miss taking her insulin yesterday evening but denies noncompliance otherwise. No sick contacts.  In the emergency department she was found to have elevated anion gap. She had mild acute renal failure. She did have an episode of watery diarrhea in the emergency department. She will need hospitalization for further management.  Home Medications: Prior to Admission medications   Medication Sig Start Date End Date Taking? Authorizing Provider  albuterol (PROVENTIL HFA;VENTOLIN HFA) 108 (90 Base) MCG/ACT inhaler Inhale 2 puffs into the lungs every 6 (six) hours as needed for wheezing or shortness of breath. 10/20/15  Yes Amao, [name], MD  atorvastatin (LIPITOR) 40 MG tablet Take 1 tablet (40 mg total) by mouth daily. 10/30/16  Yes [name], MD  busPIRone (BUSPAR) 10 MG tablet Take 1 tablet (10 mg total) by mouth 2 (two) times daily. 10/30/16  Yes [name], MD  famotidine (PEPCID) 20 MG tablet Take 1 tablet (20 mg total) by mouth 2 (two) times daily. 09/29/16  Yes Patrecia Pour, Christean Grief, MD  ferrous sulfate 325 (65 FE) MG tablet Take 1 tablet (325 mg total) by mouth daily with  breakfast. 03/27/16  Yes Donne Hazel, MD  FLUoxetine (PROZAC) 20 MG tablet Take 2 tablets (40 mg total) by mouth daily. 10/30/16  Yes [name], MD  furosemide (LASIX) 40 MG tablet Take 40 mg by mouth daily as needed for fluid.    Yes [provider]  insulin aspart (NOVOLOG) 100 UNIT/ML injection Inject 0-12 Units into the skin 3 (three) times daily with meals. Per sliding scale 10/30/16  Yes Amao, [name], MD  insulin detemir (LEVEMIR) 100 UNIT/ML injection Inject 0.16 mLs (16 Units total) into the skin 2 (two) times daily. 02/03/17  Yes Eugenie Filler, MD  metoCLOPramide (REGLAN) 5 MG/5ML solution Take 10 mLs (10 mg total) by mouth 4 (four) times daily -  before meals and at bedtime. 02/06/17  Yes Charlynne Cousins, MD  ondansetron (ZOFRAN ODT) 4 MG disintegrating tablet Take 1 tablet (4 mg total) by mouth every 8 (eight) hours as needed for nausea or vomiting. 09/04/16  Yes [name], MD  polyethylene glycol (MIRALAX / GLYCOLAX) packet Take 17 g by mouth daily as needed for mild constipation. 09/04/16  Yes [name], MD  pregabalin (LYRICA) 150 MG capsule Take 1 capsule (150 mg total) by mouth 2 (two) times daily. 01/17/17  Yes Argentina Donovan, PA-C  vitamin B-12 (CYANOCOBALAMIN) 1000 MCG tablet Take 1 tablet (1,000 mcg total) by mouth daily. 02/03/17  Yes Eugenie Filler, MD  Blood Glucose Monitoring Suppl (ACCU-CHEK AVIVA) device Use as instructed 3 times daily before meals 10/30/16   [name], MD  glucose blood (ACCU-CHEK AVIVA) test strip Use as  instructed 3 times daily before meals 10/30/16   [name], MD  Insulin Syringe-Needle U-100 (BD INSULIN SYRINGE ULTRAFINE) 31G X 15/64" 0.5 ML MISC 1 each by Does not apply route 4 (four) times daily as needed. 08/16/15   [name], MD  Lancet Devices [facility]) lancets Use as instructed 3 times daily before meals 10/30/16   [name], MD  LORazepam (ATIVAN) 1 MG tablet Take 1 tablet (1 mg total) by mouth  every 8 (eight) hours as needed for anxiety. Patient not taking: Reported on 01/30/2017 01/09/17   Varney Biles, MD  promethazine (PHENERGAN) 25 MG tablet Take 1 tablet (25 mg total) by mouth every 6 (six) hours as needed for nausea. Patient not taking: Reported on 01/31/2017 01/09/17   Varney Biles, MD    Allergies:  Allergies  Allergen Reactions  . Anesthetics, Amide Nausea And Vomiting  . Penicillins Diarrhea and Nausea And Vomiting    Has patient had a PCN reaction causing immediate rash, facial/tongue/throat swelling, SOB or lightheadedness with hypotension: No Has patient had a PCN reaction causing severe rash involving mucus membranes or skin necrosis: No Has patient had a PCN reaction that required hospitalization No Has patient had a PCN reaction occurring within the last 10 years: Yes  If all of the above answers are "NO", then may proceed with Cephalosporin use.   . Buprenorphine Hcl Rash  . Encainide Nausea And Vomiting    Past Medical History: Past Medical History:  Diagnosis Date  . Depression   . Diabetes mellitus without complication ([place])   . Essential hypertension   . Gastroparesis   . GERD (gastroesophageal reflux disease)   . HLD (hyperlipidemia)     Past Surgical History:  Procedure Laterality Date  . COLONOSCOPY  09/27/2014   at [facility]  . ESOPHAGOGASTRODUODENOSCOPY  09/27/2014   at [facility], Dr Rolan Lipa. biospy neg for celiac, neg for H pylori.   . EYE SURGERY    . gailstones    . IR FLUORO GUIDE CV LINE RIGHT  02/01/2017  . IR GENERIC HISTORICAL  01/24/2016   IR FLUORO GUIDE CV LINE RIGHT 01/24/2016 Darrell K Allred, PA-C [facility]INTER[facility]  . IR GENERIC HISTORICAL  01/24/2016   IR US GUIDE VASC ACCESS RIGHT 01/24/2016 Darrell K Allred, PA-C [facility]  . IR US GUIDE VASC ACCESS RIGHT  02/01/2017  . POSTERIOR VITRECTOMY AND MEMBRANE PEEL-LEFT EYE  09/28/2002  . POSTERIOR VITRECTOMY AND MEMBRANE PEEL-RIGHT EYE  03/16/2002  . RETINAL DETACHMENT SURGERY       Social History: Lives in [place] with her sister. Denies smoking, alcohol use or illicit drug use. Usually independent with daily activities.   Family History:  Family History  Problem Relation Age of Onset  . Cystic fibrosis Mother   . Hypertension Father   . Diabetes Brother   . Hypertension Maternal Grandmother      Review of Systems - History obtained from the patient General ROS: positive for  - fatigue Psychological ROS: positive for - anxiety Ophthalmic ROS: negative ENT ROS: negative Allergy and Immunology ROS: negative Hematological and Lymphatic ROS: negative Endocrine ROS: as in hpi Respiratory ROS: no cough, shortness of breath, or wheezing Cardiovascular ROS: no chest pain or dyspnea on exertion Gastrointestinal ROS: as in hpi Genito-Urinary ROS: no dysuria, trouble voiding, or hematuria Musculoskeletal ROS: negative Neurological ROS: no TIA or stroke symptoms Dermatological ROS: negative  Physical Examination  Vitals:   03/05/17 1536 03/05/17 1623 03/05/17 1716 03/05/17 1717  BP: Marland Kitchen)  142/78 (!) 127/57 133/66 136/66  Pulse: (!) 110 (!) 114 (!) 115 (!) 116  Resp:  18 20 18   Temp:      TempSrc:      SpO2: 100% 100% 100% 100%    BP 136/66   Pulse (!) 116   Temp 97.9 F (36.6 C) (Oral)   Resp 18   LMP 02/05/2017   SpO2 100%   General appearance: alert, cooperative, appears stated age and no distress Head: Normocephalic, without obvious abnormality, atraumatic Eyes: conjunctivae/corneas clear. PERRL, EOM's intact.  Throat: Dry mucous membranes. Neck: no adenopathy, no carotid bruit, no JVD, supple, symmetrical, trachea midline and thyroid not enlarged, symmetric, no tenderness/mass/nodules Resp: clear to auscultation bilaterally Cardio: S1, S2 is tachycardic. Regular. No S3, S4. No rubs, murmurs, or bruit. No pedal edema. GI: Abdomen is soft. Mildly tender in the epigastric area without any rebound, rigidity or guarding. No masses or  organomegaly. Bowel sounds are present. Extremities: extremities normal, atraumatic, no cyanosis or edema Pulses: 2+ and symmetric Skin: Skin color, texture, turgor normal. No rashes or lesions Lymph nodes: Cervical, supraclavicular, and axillary nodes normal. Neurologic: Oriented 3. No obvious focal neurological deficits.    Labs on Admission: I have personally reviewed following labs and imaging studies  CBC:  Recent Labs Lab 03/05/17 1252  WBC 21.2*  HGB 9.7*  HCT 33.0*  MCV 74.0*  PLT 992*   Basic Metabolic Panel:  Recent Labs Lab 03/05/17 1252  NA 136  K 4.5  CL 101  CO2 9*  GLUCOSE 532*  BUN 22*  CREATININE 1.48*  CALCIUM 9.7   GFR: CrCl cannot be calculated (Unknown ideal weight.). Liver Function Tests:  Recent Labs Lab 03/05/17 1252  AST 30  ALT 17  ALKPHOS 94  BILITOT 1.4*  PROT 7.9  ALBUMIN 4.0    Recent Labs Lab 03/05/17 1252  LIPASE 22   CBG:  Recent Labs Lab 03/05/17 1146 03/05/17 1442 03/05/17 1551 03/05/17 1654  GLUCAP 516* 493* 497* 377*     Telemetry shows sinus tachycardia  Problem List  Principal Problem:   Diabetic ketoacidosis without coma associated with type 1 diabetes mellitus ([place]) Active Problems:   Diabetic neuropathy, type I diabetes mellitus ([place])   Diabetic gastroparesis (HCC)   AKI (acute kidney injury) ([place])   Anemia, iron deficiency   DKA (diabetic ketoacidoses) ([place])   Assessment: This is a 51 year old female with a past medical history of diabetes type1, who presents with one-day history of diarrhea, nausea and vomiting. She is noted to be in diabetic ketoacidosis. She has leukocytosis. Except for the diarrhea there is no other precipitant reason for her presentation. She does have leukocytosis. However, she is afebrile. She has been hospitalized recently and so C. difficile as a possibility.  Plan: #1 Diabetic ketoacidosis in the setting of type 1 diabetes: Last HbA1c was 10.1 in July. DKA  protocol has been initiated. She was given IV fluids. Nothing by mouth for now. UA is pending.  #2 Acute Diarrhea: This started yesterday. Could be related to her diabetes. However recent hospitalization places her at risk for C. difficile. Stool sample will be sent for the same. Hold off on initiating treatment for now.  #3 Nausea and vomiting: Secondary to DKA. Symptomatic treatment. LFTs are normal. Lipase is normal. She is status post cholecystectomy.  #4 Mild acute renal failure: Most likely due to hypovolemia. Monitor urine output. Repeat labs tomorrow. IV fluids.  #5 Leukocytosis: Most likely reactive. She is afebrile. Repeat  labs tomorrow.  #6 history of iron deficiency anemia: Hemoglobin appears to be close to baseline. There might be some drop in hemoglobin due to hydration. No evidence for overt bleeding. Continue to monitor for now.  #7 history of diabetic gastroparesis and diabetic neuropathy: Likely contributing some to her nausea, vomiting. She'll be given Reglan intravenously for now. Resume her home medications when she is able to take orally. She is noted to be on Lyrica.  #8 history of anxiety and depression: Resume her psychotropic medications when she is able to take orally.   DVT Prophylaxis: Subcutaneous Heparin Code Status: Full code Family Communication: Discussed with the patient  Consults called: None   Severity of Illness: The appropriate patient status for this patient is INPATIENT. Inpatient status is judged to be reasonable and necessary in order to provide the required intensity of service to ensure the patient's safety. The patient's presenting symptoms, physical exam findings, and initial radiographic and laboratory data in the context of their chronic comorbidities is felt to place them at high risk for further clinical deterioration. Furthermore, it is not anticipated that the patient will be medically stable for discharge from the hospital within 2  midnights of admission. The following factors support the patient status of inpatient.   " The patient's presenting symptoms include nausea, vomiting, diarrhea. " The worrisome physical exam findings include tachycardia, abdominal tenderness. " The initial radiographic and laboratory data are worrisome because of diabetic ketoacidosis. " The chronic co-morbidities include diabetic gastroparesis.   * I certify that at the point of admission it is my clinical judgment that the patient will require inpatient hospital care spanning beyond 2 midnights from the point of admission due to high intensity of service, high risk for further deterioration and high frequency of surveillance required.*  Further management decisions will depend on results of further testing and patient's response to treatment.   [facility]  [facility] Pager [phone]  If 7PM-7AM, please contact night-coverage [email or web address] Password [email or web address]  03/05/2017, 5:34 PM

## 2017-03-05 NOTE — ED Notes (Signed)
EDP at bedside  

## 2017-03-05 NOTE — ED Notes (Signed)
IV continues to be at bedside. Per Billy Fischer, MD plan to allow nausea medication to work and then give PO potassium if second IV line unsuccessful.

## 2017-03-06 ENCOUNTER — Telehealth: Payer: Self-pay

## 2017-03-06 ENCOUNTER — Encounter (HOSPITAL_COMMUNITY): Payer: Self-pay | Admitting: Interventional Radiology

## 2017-03-06 ENCOUNTER — Inpatient Hospital Stay (HOSPITAL_COMMUNITY): Payer: Medicaid Other

## 2017-03-06 DIAGNOSIS — E1040 Type 1 diabetes mellitus with diabetic neuropathy, unspecified: Secondary | ICD-10-CM

## 2017-03-06 DIAGNOSIS — K3184 Gastroparesis: Secondary | ICD-10-CM

## 2017-03-06 DIAGNOSIS — R197 Diarrhea, unspecified: Secondary | ICD-10-CM

## 2017-03-06 DIAGNOSIS — D509 Iron deficiency anemia, unspecified: Secondary | ICD-10-CM

## 2017-03-06 DIAGNOSIS — E1010 Type 1 diabetes mellitus with ketoacidosis without coma: Principal | ICD-10-CM

## 2017-03-06 DIAGNOSIS — E1143 Type 2 diabetes mellitus with diabetic autonomic (poly)neuropathy: Secondary | ICD-10-CM

## 2017-03-06 DIAGNOSIS — N179 Acute kidney failure, unspecified: Secondary | ICD-10-CM

## 2017-03-06 HISTORY — PX: IR US GUIDE VASC ACCESS RIGHT: IMG2390

## 2017-03-06 HISTORY — PX: IR FLUORO GUIDE CV LINE RIGHT: IMG2283

## 2017-03-06 LAB — GLUCOSE, CAPILLARY
GLUCOSE-CAPILLARY: 275 mg/dL — AB (ref 65–99)
GLUCOSE-CAPILLARY: 110 mg/dL — AB (ref 65–99)
GLUCOSE-CAPILLARY: 131 mg/dL — AB (ref 65–99)
GLUCOSE-CAPILLARY: 184 mg/dL — AB (ref 65–99)
GLUCOSE-CAPILLARY: 129 mg/dL — AB (ref 65–99)
GLUCOSE-CAPILLARY: 107 mg/dL — AB (ref 65–99)
GLUCOSE-CAPILLARY: 167 mg/dL — AB (ref 65–99)
GLUCOSE-CAPILLARY: 173 mg/dL — AB (ref 65–99)
GLUCOSE-CAPILLARY: 174 mg/dL — AB (ref 65–99)
GLUCOSE-CAPILLARY: 179 mg/dL — AB (ref 65–99)
Glucose-Capillary: 180 mg/dL — ABNORMAL HIGH (ref 65–99)
Glucose-Capillary: 194 mg/dL — ABNORMAL HIGH (ref 65–99)
Glucose-Capillary: 205 mg/dL — ABNORMAL HIGH (ref 65–99)
Glucose-Capillary: 154 mg/dL — ABNORMAL HIGH (ref 65–99)
Glucose-Capillary: 137 mg/dL — ABNORMAL HIGH (ref 65–99)
Glucose-Capillary: 122 mg/dL — ABNORMAL HIGH (ref 65–99)
Glucose-Capillary: 115 mg/dL — ABNORMAL HIGH (ref 65–99)
Glucose-Capillary: 164 mg/dL — ABNORMAL HIGH (ref 65–99)
Glucose-Capillary: 154 mg/dL — ABNORMAL HIGH (ref 65–99)
Glucose-Capillary: 185 mg/dL — ABNORMAL HIGH (ref 65–99)

## 2017-03-06 LAB — BASIC METABOLIC PANEL
ANION GAP: 11 (ref 5–15)
ANION GAP: 11 (ref 5–15)
ANION GAP: 10 (ref 5–15)
ANION GAP: 13 (ref 5–15)
Anion gap: 10 (ref 5–15)
BUN: 10 mg/dL (ref 6–20)
BUN: 12 mg/dL (ref 6–20)
BUN: 15 mg/dL (ref 6–20)
BUN: 16 mg/dL (ref 6–20)
BUN: 19 mg/dL (ref 6–20)
CALCIUM: 8.8 mg/dL — AB (ref 8.9–10.3)
CALCIUM: 8.9 mg/dL (ref 8.9–10.3)
CALCIUM: 9 mg/dL (ref 8.9–10.3)
CALCIUM: 9.3 mg/dL (ref 8.9–10.3)
CO2: 13 mmol/L — ABNORMAL LOW (ref 22–32)
CO2: 14 mmol/L — ABNORMAL LOW (ref 22–32)
CO2: 17 mmol/L — ABNORMAL LOW (ref 22–32)
CO2: 17 mmol/L — ABNORMAL LOW (ref 22–32)
CO2: 18 mmol/L — ABNORMAL LOW (ref 22–32)
CREATININE: 0.84 mg/dL (ref 0.44–1.00)
CREATININE: 0.87 mg/dL (ref 0.44–1.00)
Calcium: 8.9 mg/dL (ref 8.9–10.3)
Chloride: 110 mmol/L (ref 101–111)
Chloride: 113 mmol/L — ABNORMAL HIGH (ref 101–111)
Chloride: 115 mmol/L — ABNORMAL HIGH (ref 101–111)
Chloride: 116 mmol/L — ABNORMAL HIGH (ref 101–111)
Chloride: 120 mmol/L — ABNORMAL HIGH (ref 101–111)
Creatinine, Ser: 0.9 mg/dL (ref 0.44–1.00)
Creatinine, Ser: 1.01 mg/dL — ABNORMAL HIGH (ref 0.44–1.00)
Creatinine, Ser: 1.21 mg/dL — ABNORMAL HIGH (ref 0.44–1.00)
GFR calc Af Amer: 60 mL/min — ABNORMAL LOW (ref >60)
GFR calc Af Amer: >60 mL/min (ref >60)
GFR calc Af Amer: >60 mL/min (ref >60)
GFR, EST NON AFRICAN AMERICAN: 52 mL/min — AB (ref >60)
GLUCOSE: 191 mg/dL — AB (ref 65–99)
GLUCOSE: 114 mg/dL — AB (ref 65–99)
Glucose, Bld: 157 mg/dL — ABNORMAL HIGH (ref 65–99)
Glucose, Bld: 179 mg/dL — ABNORMAL HIGH (ref 65–99)
Glucose, Bld: 181 mg/dL — ABNORMAL HIGH (ref 65–99)
Potassium: 3.3 mmol/L — ABNORMAL LOW (ref 3.5–5.1)
Potassium: 3.4 mmol/L — ABNORMAL LOW (ref 3.5–5.1)
Potassium: 4.3 mmol/L (ref 3.5–5.1)
Potassium: 4.4 mmol/L (ref 3.5–5.1)
Potassium: 4.4 mmol/L (ref 3.5–5.1)
SODIUM: 139 mmol/L (ref 135–145)
SODIUM: 141 mmol/L (ref 135–145)
SODIUM: 143 mmol/L (ref 135–145)
Sodium: 141 mmol/L (ref 135–145)
Sodium: 144 mmol/L (ref 135–145)

## 2017-03-06 LAB — CBC
HCT: 29.5 % — ABNORMAL LOW (ref 36.0–46.0)
Hemoglobin: 9.3 g/dL — ABNORMAL LOW (ref 12.0–15.0)
MCH: 22.4 pg — ABNORMAL LOW (ref 26.0–34.0)
MCHC: 31.5 g/dL (ref 30.0–36.0)
MCV: 71.1 fL — ABNORMAL LOW (ref 78.0–100.0)
PLATELETS: 380 10*3/uL (ref 150–400)
RBC: 4.15 MIL/uL (ref 3.87–5.11)
RDW: 16.4 % — AB (ref 11.5–15.5)
WBC: 21 10*3/uL — ABNORMAL HIGH (ref 4.0–10.5)

## 2017-03-06 LAB — MAGNESIUM: Magnesium: 1.9 mg/dL (ref 1.7–2.4)

## 2017-03-06 MED ORDER — INFLUENZA VAC SPLIT QUAD 0.5 ML IM SUSY
0.5000 mL | PREFILLED_SYRINGE | INTRAMUSCULAR | Status: DC
  Filled 2017-03-06: qty 0.5

## 2017-03-06 MED ORDER — CHLOROPROCAINE HCL 1 % IJ SOLN
INTRAMUSCULAR | Status: AC | PRN
  Administered 2017-03-06: 5 mL

## 2017-03-06 MED ORDER — POTASSIUM CHLORIDE 10 MEQ/100ML IV SOLN
10.0000 meq | INTRAVENOUS | Status: AC
  Administered 2017-03-06 – 2017-03-07 (×2): 10 meq via INTRAVENOUS
  Filled 2017-03-06 (×2): qty 100

## 2017-03-06 MED ORDER — METOCLOPRAMIDE HCL 5 MG/ML IJ SOLN
10.0000 mg | Freq: Three times a day (TID) | INTRAMUSCULAR | Status: DC
  Administered 2017-03-06 – 2017-03-07 (×4): 10 mg via INTRAVENOUS
  Filled 2017-03-06 (×4): qty 2

## 2017-03-06 MED ORDER — ACETAMINOPHEN 325 MG PO TABS: 650.0000 mg | ORAL_TABLET | Freq: Four times a day (QID) | ORAL | Status: DC | PRN

## 2017-03-06 MED ORDER — CHLOROPROCAINE HCL 1 % IJ SOLN
INTRAMUSCULAR | Status: AC
  Filled 2017-03-06: qty 30

## 2017-03-06 MED ORDER — FLUOXETINE HCL 20 MG PO CAPS
40.0000 mg | ORAL_CAPSULE | Freq: Every day | ORAL | Status: DC
  Administered 2017-03-06 – 2017-03-09 (×4): 40 mg via ORAL
  Filled 2017-03-06 (×4): qty 2

## 2017-03-06 MED ORDER — ONDANSETRON HCL 4 MG PO TABS
4.0000 mg | ORAL_TABLET | Freq: Four times a day (QID) | ORAL | Status: DC | PRN
  Administered 2017-03-08 – 2017-03-09 (×3): 4 mg via ORAL
  Filled 2017-03-06 (×3): qty 1

## 2017-03-06 MED ORDER — LIDOCAINE HCL (PF) 2 % IJ SOLN
INTRAMUSCULAR | Status: AC
  Filled 2017-03-06: qty 10

## 2017-03-06 MED ORDER — DEXTROSE 5 % IV SOLN
INTRAVENOUS | Status: DC
  Administered 2017-03-06: 18:00:00 via INTRAVENOUS

## 2017-03-06 MED ORDER — HYDRALAZINE HCL 20 MG/ML IJ SOLN
5.0000 mg | Freq: Four times a day (QID) | INTRAMUSCULAR | Status: DC | PRN
  Administered 2017-03-06 – 2017-03-07 (×2): 5 mg via INTRAVENOUS
  Filled 2017-03-06 (×2): qty 1

## 2017-03-06 MED ORDER — ATORVASTATIN CALCIUM 40 MG PO TABS
40.0000 mg | ORAL_TABLET | Freq: Every day | ORAL | Status: DC
Start: 2017-03-06 — End: 2017-03-09
  Administered 2017-03-06 – 2017-03-09 (×4): 40 mg via ORAL
  Filled 2017-03-06 (×4): qty 1

## 2017-03-06 MED ORDER — SODIUM CHLORIDE 0.9 % IV SOLN
8.0000 mg | Freq: Four times a day (QID) | INTRAVENOUS | Status: DC | PRN
  Administered 2017-03-06 – 2017-03-07 (×3): 8 mg via INTRAVENOUS
  Filled 2017-03-06 (×4): qty 4

## 2017-03-06 MED ORDER — FENTANYL CITRATE (PF) 100 MCG/2ML IJ SOLN
25.0000 ug | INTRAMUSCULAR | Status: DC | PRN
  Administered 2017-03-06 – 2017-03-07 (×2): 50 ug via INTRAVENOUS
  Filled 2017-03-06 (×2): qty 2

## 2017-03-06 MED ORDER — BUSPIRONE HCL 5 MG PO TABS
10.0000 mg | ORAL_TABLET | Freq: Two times a day (BID) | ORAL | Status: DC
  Administered 2017-03-07 – 2017-03-09 (×5): 10 mg via ORAL
  Filled 2017-03-06: qty 2
  Filled 2017-03-06: qty 1
  Filled 2017-03-06 (×3): qty 2

## 2017-03-06 NOTE — Progress Notes (Signed)
Called results of last BMP to Dr, Myna Hidalgo.  He advised he will order some Potassium replacement.  Pt also having abd pain and has been and is still currently nauseated.  Dr, Myna Hidalgo advised he will order something for pain.  Francia Greaves Amrita Radu,RN,BSN,CCRN

## 2017-03-06 NOTE — Care Management Note (Signed)
Case Management Note  Patient Details  Name: Elloise Roark MRN: [id number] Date of Birth: [birth year]  Subjective/Objective:      Noncompliant dka              Action/Plan: Date:  March 06, 2017 Chart reviewed for concurrent status and case management needs. Will continue to follow patient progress. Discharge Planning: following for needs Expected discharge date: [id number] Velva Harman, BSN, [place], [place]  Expected Discharge Date:   (unknown)               Expected Discharge Plan:  Home/Self Care  In-House Referral:     Discharge planning Services  CM Consult  Post Acute Care Choice:    Choice offered to:     DME Arranged:    DME Agency:     HH Arranged:    [place] Agency:     Status of Service:  In process, will continue to follow  If discussed at Long Length of Stay Meetings, dates discussed:    Additional Comments:  Leeroy Cha, RN 03/06/2017, 8:16 AM

## 2017-03-06 NOTE — Progress Notes (Signed)
[facility] PROGRESS NOTE    Progress Note  Bethzy Hauck  YIR:[id number] DOB: 09-27-1965 DOA: 03/05/2017 PCP: [name], MD     Brief Narrative:   [name] is an 51 y.o. female past medical history of type 1 diabetes mellitus with diabetic gastroparesis and peripheral neuropathy with last A1c of 10comes into the hospital in DKA.  Assessment/Plan:   Diabetic ketoacidosis without coma associated with type 1 diabetes mellitus (HCC) Last A1c of 10. Continue IV insulin until bicarbonate > 20, bicarbonate scarring the wrong way now13, likely due to access, place a PICC line Patient is on D5 and half-normal. Christine E CBGs every hourly and platelets every 4. Potassium is greater than 4 continue to monitor and replete as needed.  Nausea and vomiting intractable: Likely secondary to DKA. Check a 12-lead EKG, potassium is greater than 4, check a magnesium level.  Acute kidney injury: Likely due to hypovolemia baseline creatinine is less than 1, continue IV fluid hydration.  Leukocytosis: Has remained afebrile continues to have a white count of 21. Has remained afebrile sores, she is a poor dentition, but no pain with mastication. Thrombocytosis is improving which is acting as an acute phase reactant.  Iron deficiency anemia: Hemoglobin seems to be at baseline. Will need to be discharge in her sulfate. Diabetic neuropathy, type I diabetes mellitus ([place])  Questionable diarrhea: No bowel movements overnight, discontinued contact precautions. Likely due to DKA.  Diabetic gastroparesis ([place]): Increase Zofran she continues to vomit. START HER  home dose of Reglan.   DVT prophylaxis: lovenox Family Communication:none Disposition Plan/Barrier to D/C: Keep in step down. Code Status:     Code Status Orders        Start     Ordered   03/05/17 1837  Full code  Continuous     03/05/17 1836    Code Status History    Date Active Date Inactive Code Status Order  ID Comments User Context   02/05/2017 11:18 AM 02/06/2017  5:06 PM Full Code [id number]  Merton Border, MD Inpatient   01/31/2017  9:22 PM 02/03/2017  6:47 PM Full Code [id number]  Vashti Hey, MD Inpatient   09/27/2016  6:33 PM 09/29/2016  8:30 PM Full Code [id number]  Doreatha Lew, MD Inpatient   04/20/2016  6:09 PM 04/23/2016  7:37 PM Full Code [id number]  Hosie Poisson, MD Inpatient   04/06/2016  1:50 PM 04/09/2016  6:29 PM Full Code [id number]  Elgergawy, Silver Huguenin, MD ED   03/23/2016  1:22 AM 03/26/2016  9:01 PM Full Code [id number]  Vianne Bulls, MD ED   03/18/2016  3:59 PM 03/20/2016  5:41 PM Full Code [id number]  Mariel Aloe, MD Inpatient   02/19/2016  2:16 PM 02/21/2016  5:32 PM Full Code [id number]  Caren Griffins, MD ED   01/24/2016  7:01 AM 01/26/2016  8:01 PM Full Code [id number]  Karmen Bongo, MD Inpatient   01/19/2016  2:23 PM 01/21/2016  5:38 PM Full Code [id number]  Thurnell Lose, MD ED   11/27/2015 10:01 PM 12/01/2015  6:39 PM Full Code [id number]  Toy Baker, MD Inpatient   11/14/2015  4:05 AM 11/16/2015  7:31 PM Full Code [id number]  Ivor Costa, MD ED   09/02/2015 12:33 PM 09/05/2015 10:12 PM Full Code [id number]  Kelvin Cellar, MD ED   07/29/2015  7:55 AM 08/01/2015 11:03 PM Full Code [id number]  Reyne Dumas, MD ED   06/29/2015  8:27 PM 07/03/2015  5:58 PM Full  Code [id number]  Robbie Lis, MD Inpatient   06/22/2015 10:58 AM 06/25/2015  6:47 PM Full Code [id number]  Radene Gunning, NP Inpatient   06/14/2015  6:29 PM 06/20/2015  9:40 PM Full Code [id number]  Domenic Polite, MD Inpatient   05/26/2015 10:02 PM 06/05/2015  7:29 PM Full Code [id number]  Reubin Milan, MD Inpatient   05/23/2015  7:17 AM 05/24/2015  6:19 PM Full Code [id number]  Norval Morton, MD Inpatient   05/17/2015 12:21 PM 05/20/2015  6:13 PM Full Code [id number]  Eugenie Filler, MD Inpatient   05/17/2015 11:09 AM 05/17/2015 12:21 PM Full Code [id number]  Eugenie Filler, MD ED   04/19/2015  2:38  AM 04/20/2015 10:29 PM Full Code [id number]  Rise Patience, MD Inpatient   04/05/2015  5:43 AM 04/09/2015  7:25 PM Full Code [id number]  Rise Patience, MD Inpatient   04/05/2015  5:37 AM 04/05/2015  5:43 AM Full Code [id number]  Despina Hick, RN Inpatient   03/09/2015  7:42 AM 03/12/2015  7:11 PM Full Code [id number]  Theodis Blaze, MD Inpatient   02/20/2015  1:08 AM 02/22/2015  7:08 PM Full Code [id number]  Toy Baker, MD Inpatient   02/08/2015 11:43 PM 02/13/2015  3:32 PM Full Code [id number]  Lavina Hamman, MD ED   10/21/2014 12:57 AM 10/23/2014  3:14 PM Full Code [id number]  Rise Patience, MD Inpatient   02/12/2013 11:16 PM 02/17/2013  9:22 PM Full Code [id number]  Quintella Baton, MD Inpatient   11/25/2012  1:48 PM 11/28/2012  7:04 PM Full Code [id number]  Elmarie Shiley, MD ED   11/05/2012  5:37 PM 11/07/2012  4:20 PM Full Code [id number]  Sheila Oats, MD Inpatient   04/03/2012  2:27 AM 04/05/2012  4:43 PM Full Code [id number]  Quintella Baton, MD Inpatient   03/24/2012 12:21 AM 03/28/2012  5:51 PM Full Code [id number]  Earline Mayotte II, RN ED        IV Access:    Peripheral IV   Procedures and diagnostic studies:   No results found.   Medical Consultants:    None.  Anti-Infectives:    Subjective:    [name] she relates she continues to be nauseated and vomiting.  Objective:    Vitals:   03/06/17 0340 03/06/17 0400 03/06/17 0500 03/06/17 0600  BP:  (!) 173/77  (!) 159/68  Pulse:  (!) 109 (!) 104 (!) 105  Resp:  15 20 20   Temp: 98.2 F (36.8 C)     TempSrc: Oral     SpO2:  100% 100% 100%  Weight:      Height:        Intake/Output Summary (Last 24 hours) at 03/06/17 0730 Last data filed at 03/06/17 0650  Gross per 24 hour  Intake          3482.97 ml  Output                0 ml  Net          3482.97 ml   Filed Weights   03/05/17 1838  Weight: 60 kg (132 lb 4.4 oz)    Exam: General exam:in no acute  distress. Respiratory system: good air movement and clear to auscultation. Cardiovascular system: regular rate and rhythm with positive S1-S2. Gastrointestinal system:positive bowel sounds soft nontender. Central nervous system: he is awake alert and oriented 3. Extremities: No pedal edema. Skin: No rashes, lesions or ulcers  Psychiatry: Judgement and insight appear normal. Mood & affect appropriate.    Data Reviewed:    Labs: Basic Metabolic Panel:  Recent Labs Lab 03/05/17 1252 03/05/17 1856 03/05/17 2336 03/06/17 0334  NA 136 141 143 141  K 4.5 4.4 4.4 4.4  CL 101 114* 116* 115*  CO2 9* 11* 17* 13*  GLUCOSE 532* 312* 157* 191*  BUN 22* 20 19 16   CREATININE 1.48* 1.29* 1.21* 1.01*  CALCIUM 9.7 9.0 9.0 8.9   GFR Estimated Creatinine Clearance: 53.3 mL/min (A) (by C-G formula based on SCr of 1.01 mg/dL (H)). Liver Function Tests:  Recent Labs Lab 03/05/17 1252  AST 30  ALT 17  ALKPHOS 94  BILITOT 1.4*  PROT 7.9  ALBUMIN 4.0    Recent Labs Lab 03/05/17 1252  LIPASE 22   No results for input(s): AMMONIA in the last 168 hours. Coagulation profile No results for input(s): INR, PROTIME in the last 168 hours.  CBC:  Recent Labs Lab 03/05/17 1252 03/06/17 0334  WBC 21.2* 21.0*  HGB 9.7* 9.3*  HCT 33.0* 29.5*  MCV 74.0* 71.1*  PLT 464* 380   Cardiac Enzymes: No results for input(s): CKTOTAL, CKMB, CKMBINDEX, TROPONINI in the last 168 hours. BNP (last 3 results) No results for input(s): PROBNP in the last 8760 hours. CBG:  Recent Labs Lab 03/06/17 0212 03/06/17 0322 03/06/17 0431 03/06/17 0535 03/06/17 0649  GLUCAP 131* 180* 194* 205* 184*   D-Dimer: No results for input(s): DDIMER in the last 72 hours. Hgb A1c: No results for input(s): HGBA1C in the last 72 hours. Lipid Profile: No results for input(s): CHOL, HDL, LDLCALC, TRIG, CHOLHDL, LDLDIRECT in the last 72 hours. Thyroid function studies: No results for input(s): TSH, T4TOTAL,  T3FREE, THYROIDAB in the last 72 hours.  Invalid input(s): FREET3 Anemia work up: No results for input(s): VITAMINB12, FOLATE, FERRITIN, TIBC, IRON, RETICCTPCT in the last 72 hours. Sepsis Labs:  Recent Labs Lab 03/05/17 1252 03/06/17 0334  WBC 21.2* 21.0*   Microbiology Recent Results (from the past 240 hour(s))  MRSA PCR Screening     Status: None   Collection Time: 03/05/17  6:34 PM  Result Value Ref Range Status   MRSA by PCR NEGATIVE NEGATIVE Final    Comment:        The GeneXpert MRSA Assay (FDA approved for NASAL specimens only), is one component of a comprehensive MRSA colonization surveillance program. It is not intended to diagnose MRSA infection nor to guide or monitor treatment for MRSA infections.      Medications:   . heparin  5,000 Units Subcutaneous Q8H  . metoCLOPramide (REGLAN) injection  5 mg Intravenous Q6H  . sodium chloride flush  3 mL Intravenous Q12H   Continuous Infusions: . dextrose 5 % and 0.45% NaCl 125 mL/hr at 03/06/17 0110  . insulin (NOVOLIN-R) infusion 6.2 Units/hr (03/06/17 0650)      LOS: 1 day   Charlynne Cousins  [facility] Pager [phone]  *Please refer to [email or web address], password [email or web address] to get updated schedule on who will round on this patient, as hospitalists switch teams weekly. If 7PM-7AM, please contact night-coverage at [email or web address], password [email or web address] for any overnight needs.  03/06/2017, 7:30 AM

## 2017-03-06 NOTE — Progress Notes (Signed)
Pt sent to Interventional Radiology for PICC line placement. Spoke with IR over the phone prior to transport. RN informed that pt is on glucostabilizer and insulin gtt.

## 2017-03-06 NOTE — Telephone Encounter (Signed)
Messages exchanged with Marcelino Scot, [place] regarding the patient's needs at home and inability to contact her

## 2017-03-06 NOTE — Procedures (Signed)
S/p RUE DL POWER PICC  TIP SVCRA NO COMP STABLE 33CM READY FOR USE

## 2017-03-06 NOTE — Progress Notes (Signed)
Inpatient Diabetes Program Recommendations  AACE/ADA: New Consensus Statement on Inpatient Glycemic Control (2015)  Target Ranges:  Prepandial:   less than 140 mg/dL      Peak postprandial:   less than 180 mg/dL (1-2 hours)      Critically ill patients:  140 - 180 mg/dL   Lab Results  Component Value Date   GLUCAP 137 (H) 03/06/2017   HGBA1C 10.1 01/17/2017    Review of Glycemic Control  Diabetes history:DM1 Outpatient Diabetes medications: Levemir 25 units bid, Novolog 0-12 units tidwc Current orders for Inpatient glycemic control: IV insulin drip  Inpatient Diabetes Program Recommendations:  When patient meets criteria to transition from IV insulin drip: -Basal insulin 2 hrs prior to IV insulin drip D/C'd (80% home dose =20 units bid) -Cover CBG with correction when IV drip D/C'd -Add Novolog 4 units meal coverage tid if eats 50% -Add Novolog sensitive correction tid + 0-5 units hs  Attempted to speak to patient this am but patient did not feel like talking. Will follow.  Thank you, Nani Gasser. Nthony Lefferts, RN, MSN, CDE  Diabetes Coordinator Inpatient Glycemic Control Team Team Pager [phone] (8am-5pm) 03/06/2017 10:09 AM

## 2017-03-07 ENCOUNTER — Inpatient Hospital Stay (HOSPITAL_COMMUNITY): Payer: Medicaid Other

## 2017-03-07 LAB — BASIC METABOLIC PANEL
ANION GAP: 6 (ref 5–15)
ANION GAP: 7 (ref 5–15)
ANION GAP: 7 (ref 5–15)
ANION GAP: 10 (ref 5–15)
ANION GAP: 8 (ref 5–15)
BUN: 6 mg/dL (ref 6–20)
BUN: 7 mg/dL (ref 6–20)
BUN: 8 mg/dL (ref 6–20)
BUN: 8 mg/dL (ref 6–20)
BUN: 9 mg/dL (ref 6–20)
CHLORIDE: 104 mmol/L (ref 101–111)
CHLORIDE: 105 mmol/L (ref 101–111)
CHLORIDE: 109 mmol/L (ref 101–111)
CHLORIDE: 110 mmol/L (ref 101–111)
CO2: 19 mmol/L — ABNORMAL LOW (ref 22–32)
CO2: 21 mmol/L — ABNORMAL LOW (ref 22–32)
CO2: 21 mmol/L — ABNORMAL LOW (ref 22–32)
CO2: 19 mmol/L — AB (ref 22–32)
CO2: 22 mmol/L (ref 22–32)
CREATININE: 0.9 mg/dL (ref 0.44–1.00)
CREATININE: 0.91 mg/dL (ref 0.44–1.00)
Calcium: 8.4 mg/dL — ABNORMAL LOW (ref 8.9–10.3)
Calcium: 8.5 mg/dL — ABNORMAL LOW (ref 8.9–10.3)
Calcium: 8.9 mg/dL (ref 8.9–10.3)
Calcium: 8.9 mg/dL (ref 8.9–10.3)
Calcium: 8.9 mg/dL (ref 8.9–10.3)
Chloride: 105 mmol/L (ref 101–111)
Creatinine, Ser: 0.86 mg/dL (ref 0.44–1.00)
Creatinine, Ser: 0.85 mg/dL (ref 0.44–1.00)
Creatinine, Ser: 0.82 mg/dL (ref 0.44–1.00)
GFR calc Af Amer: >60 mL/min (ref >60)
GFR calc Af Amer: >60 mL/min (ref >60)
GFR calc Af Amer: >60 mL/min (ref >60)
GFR calc Af Amer: >60 mL/min (ref >60)
GFR calc non Af Amer: >60 mL/min (ref >60)
GFR calc non Af Amer: >60 mL/min (ref >60)
GFR calc non Af Amer: >60 mL/min (ref >60)
Glucose, Bld: 124 mg/dL — ABNORMAL HIGH (ref 65–99)
Glucose, Bld: 142 mg/dL — ABNORMAL HIGH (ref 65–99)
Glucose, Bld: 184 mg/dL — ABNORMAL HIGH (ref 65–99)
Glucose, Bld: 279 mg/dL — ABNORMAL HIGH (ref 65–99)
Glucose, Bld: 308 mg/dL — ABNORMAL HIGH (ref 65–99)
POTASSIUM: 3.4 mmol/L — AB (ref 3.5–5.1)
POTASSIUM: 3.2 mmol/L — AB (ref 3.5–5.1)
POTASSIUM: 3.6 mmol/L (ref 3.5–5.1)
POTASSIUM: 3.6 mmol/L (ref 3.5–5.1)
Potassium: 3.4 mmol/L — ABNORMAL LOW (ref 3.5–5.1)
SODIUM: 131 mmol/L — AB (ref 135–145)
SODIUM: 133 mmol/L — AB (ref 135–145)
SODIUM: 134 mmol/L — AB (ref 135–145)
SODIUM: 137 mmol/L (ref 135–145)
SODIUM: 138 mmol/L (ref 135–145)

## 2017-03-07 LAB — URINE CULTURE

## 2017-03-07 LAB — BLOOD GAS, ARTERIAL
Acid-base deficit: 17.4 mmol/L — ABNORMAL HIGH (ref 0.0–2.0)
Bicarbonate: 9.3 mmol/L — ABNORMAL LOW (ref 20.0–28.0)
DRAWN BY: 257881
O2 SAT: 96.4 %
PCO2 ART: 25.4 mmHg — AB (ref 32.0–48.0)
PH ART: 7.188 — AB (ref 7.350–7.450)
PO2 ART: 113 mmHg — AB (ref 83.0–108.0)
Patient temperature: 98.6

## 2017-03-07 LAB — GLUCOSE, CAPILLARY
GLUCOSE-CAPILLARY: 104 mg/dL — AB (ref 65–99)
GLUCOSE-CAPILLARY: 150 mg/dL — AB (ref 65–99)
GLUCOSE-CAPILLARY: 156 mg/dL — AB (ref 65–99)
GLUCOSE-CAPILLARY: 166 mg/dL — AB (ref 65–99)
GLUCOSE-CAPILLARY: 170 mg/dL — AB (ref 65–99)
GLUCOSE-CAPILLARY: 174 mg/dL — AB (ref 65–99)
GLUCOSE-CAPILLARY: 198 mg/dL — AB (ref 65–99)
Glucose-Capillary: 185 mg/dL — ABNORMAL HIGH (ref 65–99)
Glucose-Capillary: 174 mg/dL — ABNORMAL HIGH (ref 65–99)
Glucose-Capillary: 137 mg/dL — ABNORMAL HIGH (ref 65–99)
Glucose-Capillary: 177 mg/dL — ABNORMAL HIGH (ref 65–99)
Glucose-Capillary: 179 mg/dL — ABNORMAL HIGH (ref 65–99)
Glucose-Capillary: 181 mg/dL — ABNORMAL HIGH (ref 65–99)
Glucose-Capillary: 140 mg/dL — ABNORMAL HIGH (ref 65–99)

## 2017-03-07 LAB — MAGNESIUM: MAGNESIUM: 1.5 mg/dL — AB (ref 1.7–2.4)

## 2017-03-07 MED ORDER — INSULIN ASPART 100 UNIT/ML ~~LOC~~ SOLN
0.0000 [IU] | Freq: Three times a day (TID) | SUBCUTANEOUS | Status: DC
  Administered 2017-03-07: 3 [IU] via SUBCUTANEOUS
  Administered 2017-03-07: 2 [IU] via SUBCUTANEOUS
  Administered 2017-03-08: 5 [IU] via SUBCUTANEOUS
  Administered 2017-03-09: 3 [IU] via SUBCUTANEOUS

## 2017-03-07 MED ORDER — SODIUM CHLORIDE 0.9% FLUSH: 10.0000 mL | INTRAVENOUS | Status: DC | PRN

## 2017-03-07 MED ORDER — POTASSIUM CHLORIDE 10 MEQ/100ML IV SOLN
10.0000 meq | INTRAVENOUS | Status: AC
  Administered 2017-03-07 (×4): 10 meq via INTRAVENOUS
  Filled 2017-03-07 (×4): qty 100

## 2017-03-07 MED ORDER — SODIUM CHLORIDE 0.9% FLUSH
10.0000 mL | Freq: Two times a day (BID) | INTRAVENOUS | Status: DC
  Administered 2017-03-07 – 2017-03-08 (×3): 10 mL

## 2017-03-07 MED ORDER — BACLOFEN 10 MG PO TABS
5.0000 mg | ORAL_TABLET | Freq: Three times a day (TID) | ORAL | Status: AC
  Administered 2017-03-07 – 2017-03-08 (×2): 5 mg via ORAL
  Filled 2017-03-07 (×2): qty 1

## 2017-03-07 MED ORDER — FUROSEMIDE 40 MG PO TABS: 40.0000 mg | ORAL_TABLET | Freq: Every day | ORAL | Status: DC | PRN

## 2017-03-07 MED ORDER — INSULIN ASPART 100 UNIT/ML ~~LOC~~ SOLN
3.0000 [IU] | Freq: Three times a day (TID) | SUBCUTANEOUS | Status: DC
  Administered 2017-03-08 – 2017-03-09 (×3): 3 [IU] via SUBCUTANEOUS

## 2017-03-07 MED ORDER — CHLORHEXIDINE GLUCONATE CLOTH 2 % EX PADS: 6.0000 | MEDICATED_PAD | Freq: Every day | CUTANEOUS | Status: DC

## 2017-03-07 MED ORDER — LISINOPRIL 5 MG PO TABS
5.0000 mg | ORAL_TABLET | Freq: Every day | ORAL | Status: DC
  Administered 2017-03-07 – 2017-03-09 (×3): 5 mg via ORAL
  Filled 2017-03-07: qty 1
  Filled 2017-03-07: qty 2
  Filled 2017-03-07: qty 1

## 2017-03-07 MED ORDER — LORAZEPAM 2 MG/ML IJ SOLN
0.5000 mg | Freq: Four times a day (QID) | INTRAMUSCULAR | Status: DC | PRN
  Administered 2017-03-07: 1 mg via INTRAVENOUS

## 2017-03-07 MED ORDER — LORAZEPAM 2 MG/ML IJ SOLN
INTRAMUSCULAR | Status: AC
  Administered 2017-03-07: 1 mg via INTRAVENOUS
  Filled 2017-03-07: qty 1

## 2017-03-07 MED ORDER — BACLOFEN 10 MG PO TABS: 5.0000 mg | ORAL_TABLET | Freq: Three times a day (TID) | ORAL | Status: DC

## 2017-03-07 MED ORDER — ZOLPIDEM TARTRATE 5 MG PO TABS
5.0000 mg | ORAL_TABLET | Freq: Every evening | ORAL | Status: DC | PRN
  Administered 2017-03-08: 5 mg via ORAL
  Filled 2017-03-07 (×2): qty 1

## 2017-03-07 MED ORDER — METOCLOPRAMIDE HCL 5 MG/ML IJ SOLN
10.0000 mg | Freq: Four times a day (QID) | INTRAMUSCULAR | Status: DC
  Administered 2017-03-07 – 2017-03-08 (×4): 10 mg via INTRAVENOUS
  Filled 2017-03-07 (×4): qty 2

## 2017-03-07 MED ORDER — INSULIN ASPART 100 UNIT/ML ~~LOC~~ SOLN: 0.0000 [IU] | Freq: Every day | SUBCUTANEOUS | Status: DC

## 2017-03-07 MED ORDER — INSULIN DETEMIR 100 UNIT/ML ~~LOC~~ SOLN
10.0000 [IU] | Freq: Two times a day (BID) | SUBCUTANEOUS | Status: DC
  Administered 2017-03-07 (×2): 10 [IU] via SUBCUTANEOUS
  Filled 2017-03-07 (×4): qty 0.1

## 2017-03-07 NOTE — Progress Notes (Signed)
   03/07/17 0900  Clinical Encounter Type  Visited With Patient  Visit Type Initial;Psychological support;Spiritual support;Critical Care  Referral From Nurse  Consult/Referral To Chaplain  Spiritual Encounters  Spiritual Needs Emotional;Other (Comment) (Spiritual Conversation/support)  Stress Factors  Patient Stress Factors None identified   I visited with the patient per Spiritual Care consult referral by the nurse.  The patient was sleepy and had a flat affect upon my arrival.  When asked if she needed anything, she stated, "I can't think of anything."   Please, contact Spiritual Care for further assistance.  We will follow up with her.   [place] M.Div.

## 2017-03-07 NOTE — Progress Notes (Signed)
Transferred in from ICU, awake alert and oriented.

## 2017-03-07 NOTE — Progress Notes (Signed)
[facility] PROGRESS NOTE    Progress Note  [name]  AJG:[id number] DOB: October 08, 1965 DOA: 03/05/2017 PCP: [name], MD     Brief Narrative:   [name] is an 51 y.o. female past medical history of type 1 diabetes mellitus with diabetic gastroparesis and peripheral neuropathy with last A1c of 10,comes into the hospital in DKA.  Assessment/Plan:   Diabetic ketoacidosis without coma associated with type 1 diabetes mellitus (HCC) Last A1c of 10. Continue IV insulin until bicarbonate > 20,Bicarbonate continues to improve, continue patient on D5, CBGs every hour and basic metabolic panel every 4.   Nausea and vomiting intractable: Likely secondary to DKA. Check a 12-lead EKG, potassium is greater than 4, check a magnesium level.  Acute kidney injury: Likely due to hypovolemia baseline creatinine is less than 1, resolved with IV fluids.  Leukocytosis: Has remained afebrile continues to have a white count of 21. Has remained afebrile, nosores, she is a poor dentition, but no pain with mastication. Thrombocytosis is improving which is acting as an acute phase reactant.  Iron deficiency anemia: Hemoglobin seems to be at baseline. Will need to be discharge on ferrous sulfate.  Diabetic neuropathy, type I diabetes mellitus ([place])  Questionable diarrhea: No bowel movements overnight, discontinued contact precautions. Likely due to DKA.  Diabetic gastroparesis ([place]): Increase Zofran she continues to vomit. cont  home dose of Reglan.   DVT prophylaxis: lovenox Family Communication:none Disposition Plan/Barrier to D/C: Keep in step down until transition off insulin drip. Code Status:     Code Status Orders        Start     Ordered   03/05/17 1837  Full code  Continuous     03/05/17 1836    Code Status History    Date Active Date Inactive Code Status Order ID Comments User Context   02/05/2017 11:18 AM 02/06/2017  5:06 PM Full Code [id number]  Merton Border,  MD Inpatient   01/31/2017  9:22 PM 02/03/2017  6:47 PM Full Code [id number]  Vashti Hey, MD Inpatient   09/27/2016  6:33 PM 09/29/2016  8:30 PM Full Code [id number]  Doreatha Lew, MD Inpatient   04/20/2016  6:09 PM 04/23/2016  7:37 PM Full Code [id number]  Hosie Poisson, MD Inpatient   04/06/2016  1:50 PM 04/09/2016  6:29 PM Full Code [id number]  Elgergawy, Silver Huguenin, MD ED   03/23/2016  1:22 AM 03/26/2016  9:01 PM Full Code [id number]  Vianne Bulls, MD ED   03/18/2016  3:59 PM 03/20/2016  5:41 PM Full Code [id number]  Mariel Aloe, MD Inpatient   02/19/2016  2:16 PM 02/21/2016  5:32 PM Full Code [id number]  Caren Griffins, MD ED   01/24/2016  7:01 AM 01/26/2016  8:01 PM Full Code [id number]  Karmen Bongo, MD Inpatient   01/19/2016  2:23 PM 01/21/2016  5:38 PM Full Code [id number]  Thurnell Lose, MD ED   11/27/2015 10:01 PM 12/01/2015  6:39 PM Full Code [id number]  Toy Baker, MD Inpatient   11/14/2015  4:05 AM 11/16/2015  7:31 PM Full Code [id number]  Ivor Costa, MD ED   09/02/2015 12:33 PM 09/05/2015 10:12 PM Full Code [id number]  Kelvin Cellar, MD ED   07/29/2015  7:55 AM 08/01/2015 11:03 PM Full Code [id number]  Reyne Dumas, MD ED   06/29/2015  8:27 PM 07/03/2015  5:58 PM Full Code [id number]  Robbie Lis, MD Inpatient   06/22/2015 10:58 AM 06/25/2015  6:47 PM Full Code  [id number]  Radene Gunning, NP Inpatient   06/14/2015  6:29 PM 06/20/2015  9:40 PM Full Code [id number]  Domenic Polite, MD Inpatient   05/26/2015 10:02 PM 06/05/2015  7:29 PM Full Code [id number]  Reubin Milan, MD Inpatient   05/23/2015  7:17 AM 05/24/2015  6:19 PM Full Code [id number]  Norval Morton, MD Inpatient   05/17/2015 12:21 PM 05/20/2015  6:13 PM Full Code [id number]  Eugenie Filler, MD Inpatient   05/17/2015 11:09 AM 05/17/2015 12:21 PM Full Code [id number]  Eugenie Filler, MD ED   04/19/2015  2:38 AM 04/20/2015 10:29 PM Full Code [id number]  Rise Patience, MD Inpatient   04/05/2015  5:43 AM  04/09/2015  7:25 PM Full Code [id number]  Rise Patience, MD Inpatient   04/05/2015  5:37 AM 04/05/2015  5:43 AM Full Code [id number]  Despina Hick, RN Inpatient   03/09/2015  7:42 AM 03/12/2015  7:11 PM Full Code [id number]  Theodis Blaze, MD Inpatient   02/20/2015  1:08 AM 02/22/2015  7:08 PM Full Code [id number]  Toy Baker, MD Inpatient   02/08/2015 11:43 PM 02/13/2015  3:32 PM Full Code [id number]  Lavina Hamman, MD ED   10/21/2014 12:57 AM 10/23/2014  3:14 PM Full Code [id number]  Rise Patience, MD Inpatient   02/12/2013 11:16 PM 02/17/2013  9:22 PM Full Code [id number]  Quintella Baton, MD Inpatient   11/25/2012  1:48 PM 11/28/2012  7:04 PM Full Code [id number]  Elmarie Shiley, MD ED   11/05/2012  5:37 PM 11/07/2012  4:20 PM Full Code [id number]  Sheila Oats, MD Inpatient   04/03/2012  2:27 AM 04/05/2012  4:43 PM Full Code [id number]  Quintella Baton, MD Inpatient   03/24/2012 12:21 AM 03/28/2012  5:51 PM Full Code [id number]  Earline Mayotte II, RN ED        IV Access:    Peripheral IV   Procedures and diagnostic studies:   Ir Fluoro Guide Cv Line Right  Result Date: 03/06/2017 INDICATION: DIABETES, DKA, ACCESS FOR IV THERAPY AND HYDRATION EXAM: ULTRASOUND AND FLUOROSCOPIC GUIDED PICC LINE INSERTION MEDICATIONS: 1% PROCAINE LOCALLY CONTRAST:  None FLUOROSCOPY TIME:  Six seconds (less than 1 mGy) COMPLICATIONS: None immediate. TECHNIQUE: The procedure, risks, benefits, and alternatives were explained to the patient and informed written consent was obtained. A timeout was performed prior to the initiation of the procedure. The right upper extremity was prepped with chlorhexidine in a sterile fashion, and a sterile drape was applied covering the operative field. Maximum barrier sterile technique with sterile gowns and gloves were used for the procedure. A timeout was performed prior to the initiation of the procedure. Local anesthesia was provided with 1% lidocaine. Under  direct ultrasound guidance, the right brachial vein was accessed with a micropuncture kit after the overlying soft tissues were anesthetized with 1% lidocaine. An ultrasound image was saved for documentation purposes. A guidewire was advanced to the level of the superior caval-atrial junction for measurement purposes and the PICC line was cut to length. A peel-away sheath was placed and a 33 cm, 5 [place], dual lumen was inserted to level of the superior caval-atrial junction. A post procedure spot fluoroscopic was obtained. The catheter easily aspirated and flushed and was sutured in place. A dressing was placed. The patient tolerated the procedure well without immediate post procedural complication. FINDINGS: After catheter placement, the tip lies within the superior cavoatrial junction. The catheter aspirates and flushes normally and  is ready for immediate use. IMPRESSION: Successful ultrasound and fluoroscopic guided placement of a right brachial vein approach, 33 cm, 5 French, dual lumen PICC with tip at the superior caval-atrial junction. The PICC line is ready for immediate use. Electronically Signed   By: Jerilynn Mages.  Shick M.D.   On: 03/06/2017 12:24   Ir US Guide Vasc Access Right  Result Date: 03/06/2017 INDICATION: DIABETES, DKA, ACCESS FOR IV THERAPY AND HYDRATION EXAM: ULTRASOUND AND FLUOROSCOPIC GUIDED PICC LINE INSERTION MEDICATIONS: 1% PROCAINE LOCALLY CONTRAST:  None FLUOROSCOPY TIME:  Six seconds (less than 1 mGy) COMPLICATIONS: None immediate. TECHNIQUE: The procedure, risks, benefits, and alternatives were explained to the patient and informed written consent was obtained. A timeout was performed prior to the initiation of the procedure. The right upper extremity was prepped with chlorhexidine in a sterile fashion, and a sterile drape was applied covering the operative field. Maximum barrier sterile technique with sterile gowns and gloves were used for the procedure. A timeout was performed prior to  the initiation of the procedure. Local anesthesia was provided with 1% lidocaine. Under direct ultrasound guidance, the right brachial vein was accessed with a micropuncture kit after the overlying soft tissues were anesthetized with 1% lidocaine. An ultrasound image was saved for documentation purposes. A guidewire was advanced to the level of the superior caval-atrial junction for measurement purposes and the PICC line was cut to length. A peel-away sheath was placed and a 33 cm, 5 [place], dual lumen was inserted to level of the superior caval-atrial junction. A post procedure spot fluoroscopic was obtained. The catheter easily aspirated and flushed and was sutured in place. A dressing was placed. The patient tolerated the procedure well without immediate post procedural complication. FINDINGS: After catheter placement, the tip lies within the superior cavoatrial junction. The catheter aspirates and flushes normally and is ready for immediate use. IMPRESSION: Successful ultrasound and fluoroscopic guided placement of a right brachial vein approach, 33 cm, 5 French, dual lumen PICC with tip at the superior caval-atrial junction. The PICC line is ready for immediate use. Electronically Signed   By: Jerilynn Mages.  Shick M.D.   On: 03/06/2017 12:24   Dg Chest Port 1 View  Result Date: 03/07/2017 CLINICAL DATA:  Cough. EXAM: PORTABLE CHEST 1 VIEW COMPARISON:  01/31/2017. FINDINGS: Right PICC line in stable position. Heart size normal. Mild right base subsegmental atelectasis. No focal infiltrate. No pleural effusion or pneumothorax. Thoracic spine scoliosis. IMPRESSION: 1. Right PICC line stable position. 2.  Mild right base subsegmental atelectasis. Electronically Signed   By: Marcello Moores  Register   On: 03/07/2017 06:28     Medical Consultants:    None.  Anti-Infectives:    Subjective:    [name] she also vomiting has improved she like to try clears.  Objective:    Vitals:   03/07/17 0400 03/07/17 0500  03/07/17 0600 03/07/17 0700  BP: (!) 178/81 (!) 180/77 (!) 161/64 (!) 150/57  Pulse: (!) 102 (!) 101 (!) 103 (!) 101  Resp: 15 18 17 18   Temp: 98 F (36.7 C)     TempSrc: Oral     SpO2: 100% 100% 100% 100%  Weight:      Height:        Intake/Output Summary (Last 24 hours) at 03/07/17 0722 Last data filed at 03/07/17 0536  Gross per 24 hour  Intake          1062.24 ml  Output  1000 ml  Net            62.24 ml   Filed Weights   03/05/17 1838  Weight: 60 kg (132 lb 4.4 oz)    Exam: General exam:in no acute distress. Respiratory system: good air movement and clear to auscultation. Cardiovascular system: regular rate and rhythm with positive S1-S2. Gastrointestinal system:positive bowel sounds soft nontender. Central nervous system: he is awake alert and oriented 3. Extremities: No pedal edema. Skin: No rashes, lesions or ulcers Psychiatry: Judgement and insight appear normal. Mood & affect appropriate.    Data Reviewed:    Labs: Basic Metabolic Panel:  Recent Labs Lab 03/06/17 1049 03/06/17 1548 03/06/17 1948 03/07/17 0007 03/07/17 0351  NA 144 141 139 138 137  K 4.3 3.4* 3.3* 3.4* 3.4*  CL 120* 113* 110 109 110  CO2 14* 17* 18* 19* 19*  GLUCOSE 114* 179* 181* 184* 142*  BUN 15 12 10 8 9   CREATININE 0.90 0.84 0.87 0.91 0.90  CALCIUM 9.3 8.9 8.8* 8.9 8.9  MG 1.9  --   --   --   --    GFR Estimated Creatinine Clearance: 59.8 mL/min (by C-G formula based on SCr of 0.9 mg/dL). Liver Function Tests:  Recent Labs Lab 03/05/17 1252  AST 30  ALT 17  ALKPHOS 94  BILITOT 1.4*  PROT 7.9  ALBUMIN 4.0    Recent Labs Lab 03/05/17 1252  LIPASE 22   No results for input(s): AMMONIA in the last 168 hours. Coagulation profile No results for input(s): INR, PROTIME in the last 168 hours.  CBC:  Recent Labs Lab 03/05/17 1252 03/06/17 0334  WBC 21.2* 21.0*  HGB 9.7* 9.3*  HCT 33.0* 29.5*  MCV 74.0* 71.1*  PLT 464* 380   Cardiac  Enzymes: No results for input(s): CKTOTAL, CKMB, CKMBINDEX, TROPONINI in the last 168 hours. BNP (last 3 results) No results for input(s): PROBNP in the last 8760 hours. CBG:  Recent Labs Lab 03/07/17 0159 03/07/17 0304 03/07/17 0408 03/07/17 0524 03/07/17 0639  GLUCAP 174* 150* 137* 166* 156*   D-Dimer: No results for input(s): DDIMER in the last 72 hours. Hgb A1c: No results for input(s): HGBA1C in the last 72 hours. Lipid Profile: No results for input(s): CHOL, HDL, LDLCALC, TRIG, CHOLHDL, LDLDIRECT in the last 72 hours. Thyroid function studies: No results for input(s): TSH, T4TOTAL, T3FREE, THYROIDAB in the last 72 hours.  Invalid input(s): FREET3 Anemia work up: No results for input(s): VITAMINB12, FOLATE, FERRITIN, TIBC, IRON, RETICCTPCT in the last 72 hours. Sepsis Labs:  Recent Labs Lab 03/05/17 1252 03/06/17 0334  WBC 21.2* 21.0*   Microbiology Recent Results (from the past 240 hour(s))  MRSA PCR Screening     Status: None   Collection Time: 03/05/17  6:34 PM  Result Value Ref Range Status   MRSA by PCR NEGATIVE NEGATIVE Final    Comment:        The GeneXpert MRSA Assay (FDA approved for NASAL specimens only), is one component of a comprehensive MRSA colonization surveillance program. It is not intended to diagnose MRSA infection nor to guide or monitor treatment for MRSA infections.      Medications:   . atorvastatin  40 mg Oral Daily  . busPIRone  10 mg Oral BID  . Chlorhexidine Gluconate Cloth  6 each Topical Daily  . FLUoxetine  40 mg Oral Daily  . heparin  5,000 Units Subcutaneous Q8H  . Influenza vac split quadrivalent PF  0.5 mL  Intramuscular Tomorrow-1000  . metoCLOPramide (REGLAN) injection  10 mg Intravenous Q8H  . sodium chloride flush  10-40 mL Intracatheter Q12H  . sodium chloride flush  3 mL Intravenous Q12H   Continuous Infusions: . dextrose 50 mL/hr at 03/06/17 1900  . insulin (NOVOLIN-R) infusion 1 Units/hr (03/07/17  0641)  . ondansetron (ZOFRAN) IV 8 mg (03/07/17 0347)      LOS: 2 days   Charlynne Cousins  [facility] Pager [phone]  *Please refer to [email or web address], password [email or web address] to get updated schedule on who will round on this patient, as hospitalists switch teams weekly. If 7PM-7AM, please contact night-coverage at [email or web address], password [email or web address] for any overnight needs.  03/07/2017, 7:22 AM

## 2017-03-08 LAB — GLUCOSE, CAPILLARY
GLUCOSE-CAPILLARY: 112 mg/dL — AB (ref 65–99)
Glucose-Capillary: 172 mg/dL — ABNORMAL HIGH (ref 65–99)
Glucose-Capillary: 155 mg/dL — ABNORMAL HIGH (ref 65–99)
Glucose-Capillary: 249 mg/dL — ABNORMAL HIGH (ref 65–99)
Glucose-Capillary: 88 mg/dL (ref 65–99)

## 2017-03-08 LAB — BASIC METABOLIC PANEL
ANION GAP: 9 (ref 5–15)
Anion gap: 6 (ref 5–15)
BUN: 8 mg/dL (ref 6–20)
BUN: 9 mg/dL (ref 6–20)
CO2: 24 mmol/L (ref 22–32)
CO2: 23 mmol/L (ref 22–32)
CREATININE: 0.97 mg/dL (ref 0.44–1.00)
CREATININE: 1 mg/dL (ref 0.44–1.00)
Calcium: 8.7 mg/dL — ABNORMAL LOW (ref 8.9–10.3)
Calcium: 8.9 mg/dL (ref 8.9–10.3)
Chloride: 105 mmol/L (ref 101–111)
Chloride: 103 mmol/L (ref 101–111)
GFR calc non Af Amer: >60 mL/min (ref >60)
GFR calc non Af Amer: >60 mL/min (ref >60)
Glucose, Bld: 149 mg/dL — ABNORMAL HIGH (ref 65–99)
Glucose, Bld: 155 mg/dL — ABNORMAL HIGH (ref 65–99)
Potassium: 3.8 mmol/L (ref 3.5–5.1)
Potassium: 3.6 mmol/L (ref 3.5–5.1)
Sodium: 135 mmol/L (ref 135–145)
Sodium: 135 mmol/L (ref 135–145)

## 2017-03-08 LAB — CBC
HEMATOCRIT: 31.2 % — AB (ref 36.0–46.0)
HEMOGLOBIN: 9.7 g/dL — AB (ref 12.0–15.0)
MCH: 22.2 pg — ABNORMAL LOW (ref 26.0–34.0)
MCHC: 31.1 g/dL (ref 30.0–36.0)
MCV: 71.6 fL — ABNORMAL LOW (ref 78.0–100.0)
Platelets: 371 10*3/uL (ref 150–400)
RBC: 4.36 MIL/uL (ref 3.87–5.11)
RDW: 16.8 % — ABNORMAL HIGH (ref 11.5–15.5)
WBC: 9.3 10*3/uL (ref 4.0–10.5)

## 2017-03-08 MED ORDER — METOCLOPRAMIDE HCL 10 MG/10ML PO SOLN
10.0000 mg | Freq: Three times a day (TID) | ORAL | Status: DC
  Administered 2017-03-08 – 2017-03-09 (×5): 10 mg via ORAL
  Filled 2017-03-08 (×7): qty 10

## 2017-03-08 MED ORDER — LISINOPRIL 5 MG PO TABS: 5.0000 mg | ORAL_TABLET | Freq: Every day | ORAL | 0 refills | Status: DC

## 2017-03-08 MED ORDER — INSULIN DETEMIR 100 UNIT/ML ~~LOC~~ SOLN
12.0000 [IU] | Freq: Two times a day (BID) | SUBCUTANEOUS | Status: DC
  Administered 2017-03-08 – 2017-03-09 (×3): 12 [IU] via SUBCUTANEOUS
  Filled 2017-03-08 (×4): qty 0.12

## 2017-03-08 NOTE — Progress Notes (Signed)
Pt c/o weakness and nausea despite medication. Pt still unable to eat today despite diet advancement and encouragement. MD paged.

## 2017-03-08 NOTE — Discharge Summary (Signed)
Physician Discharge Summary  [name] IWP:[id number] DOB: 1966-03-19 DOA: 03/05/2017  PCP: [name], MD  Admit date: 03/05/2017 Discharge date: 03/08/2017  Admitted From: home Disposition:  Home  Recommendations for Outpatient Follow-up:  1. Follow up with PCP in 1-2 weeks 2. Please obtain BMP/CBC in one week   Home Health:No Equipment/Devices:none  Discharge Condition:stable CODE STATUS:full Diet recommendation: Heart Healthy  Brief/Interim Summary: 51 y.o. female past medical history of type 1 diabetes mellitus with diabetic gastroparesis and peripheral neuropathy with last A1c of 10,comes into the hospital in DKA.  Discharge Diagnoses:  Principal Problem:   Diabetic ketoacidosis without coma associated with type 1 diabetes mellitus ([place]) Active Problems:   Diabetic neuropathy, type I diabetes mellitus ([place])   Diabetic gastroparesis ([facility])   AKI (acute kidney injury) ([place])   Anemia, iron deficiency   DKA (diabetic ketoacidoses) ([place])   Diarrhea  DKA without coma associated with type 1 diabetes mellitus: Last A1c was 10, she was admitted to the hospital and started on IV insulin once her anion gap closed and her bicarbonate normalize she was changed to long-acting insulin sliding scale and her DKA resolved. Likely due to noncompliance.  Gastroparesis with nausea and vomiting intractable: Likely due to DKA. She was treated symptomatically with Zofran and Reglan. . Acute kidney injury: Likely prerenal in etiology did resolve with IV fluids.  Leukocytosis: She remained afebrile, Culture data remain negative. Likely due to stressed emargination.  Iron deficiency anemia: Continue ferrous sulfate.  Diabetes neuropathy: Continue Lyrica.  Questionable diarrhea: She remained afebrile she had no further diarrhea once admitted to the hospital, this was likely due to DKA.    Discharge Instructions  Discharge Instructions    Diet - low sodium heart healthy     Complete by:  As directed    Diet - low sodium heart healthy    Complete by:  As directed    Increase activity slowly    Complete by:  As directed    Increase activity slowly    Complete by:  As directed      Allergies as of 03/08/2017      Reactions   Anesthetics, Amide Nausea And Vomiting   Penicillins Diarrhea, Nausea And Vomiting   Has patient had a PCN reaction causing immediate rash, facial/tongue/throat swelling, SOB or lightheadedness with hypotension: No Has patient had a PCN reaction causing severe rash involving mucus membranes or skin necrosis: No Has patient had a PCN reaction that required hospitalization No Has patient had a PCN reaction occurring within the last 10 years: Yes  If all of the above answers are "NO", then may proceed with Cephalosporin use.   Buprenorphine Hcl Rash   Encainide Nausea And Vomiting      Medication List    TAKE these medications   ACCU-CHEK AVIVA device Use as instructed 3 times daily before meals   accu-chek softclix lancets Use as instructed 3 times daily before meals   albuterol 108 (90 Base) MCG/ACT inhaler Commonly known as:  PROVENTIL HFA;VENTOLIN HFA Inhale 2 puffs into the lungs every 6 (six) hours as needed for wheezing or shortness of breath.   atorvastatin 40 MG tablet Commonly known as:  LIPITOR Take 1 tablet (40 mg total) by mouth daily.   busPIRone 10 MG tablet Commonly known as:  BUSPAR Take 1 tablet (10 mg total) by mouth 2 (two) times daily.   famotidine 20 MG tablet Commonly known as:  PEPCID Take 1 tablet (20 mg total) by mouth 2 (  two) times daily.   ferrous sulfate 325 (65 FE) MG tablet Take 1 tablet (325 mg total) by mouth daily with breakfast.   FLUoxetine 20 MG tablet Commonly known as:  PROZAC Take 2 tablets (40 mg total) by mouth daily.   furosemide 40 MG tablet Commonly known as:  LASIX Take 40 mg by mouth daily as needed for fluid.   glucose blood test strip Commonly known as:  ACCU-CHEK  AVIVA Use as instructed 3 times daily before meals   insulin aspart 100 UNIT/ML injection Commonly known as:  novoLOG Inject 0-12 Units into the skin 3 (three) times daily with meals. Per sliding scale   insulin detemir 100 UNIT/ML injection Commonly known as:  LEVEMIR Inject 0.16 mLs (16 Units total) into the skin 2 (two) times daily.   Insulin Syringe-Needle U-100 31G X 15/64" 0.5 ML Misc Commonly known as:  BD INSULIN SYRINGE ULTRAFINE 1 each by Does not apply route 4 (four) times daily as needed.   lisinopril 5 MG tablet Commonly known as:  PRINIVIL,ZESTRIL Take 1 tablet (5 mg total) by mouth daily.   LORazepam 1 MG tablet Commonly known as:  ATIVAN Take 1 tablet (1 mg total) by mouth every 8 (eight) hours as needed for anxiety.   metoCLOPramide 5 MG/5ML solution Commonly known as:  REGLAN Take 10 mLs (10 mg total) by mouth 4 (four) times daily -  before meals and at bedtime.   ondansetron 4 MG disintegrating tablet Commonly known as:  ZOFRAN ODT Take 1 tablet (4 mg total) by mouth every 8 (eight) hours as needed for nausea or vomiting.   polyethylene glycol packet Commonly known as:  MIRALAX / GLYCOLAX Take 17 g by mouth daily as needed for mild constipation.   pregabalin 150 MG capsule Commonly known as:  LYRICA Take 1 capsule (150 mg total) by mouth 2 (two) times daily.   vitamin B-12 1000 MCG tablet Commonly known as:  CYANOCOBALAMIN Take 1 tablet (1,000 mcg total) by mouth daily.            Discharge Care Instructions        Start     Ordered   03/08/17 0000  lisinopril (PRINIVIL,ZESTRIL) 5 MG tablet  Daily     03/08/17 0725   03/08/17 0000  Increase activity slowly     03/08/17 0725   03/08/17 0000  Diet - low sodium heart healthy     03/08/17 0725   03/08/17 0000  Increase activity slowly     03/08/17 0727   03/08/17 0000  Diet - low sodium heart healthy     03/08/17 0727      Allergies  Allergen Reactions  . Anesthetics, Amide Nausea And  Vomiting  . Penicillins Diarrhea and Nausea And Vomiting    Has patient had a PCN reaction causing immediate rash, facial/tongue/throat swelling, SOB or lightheadedness with hypotension: No Has patient had a PCN reaction causing severe rash involving mucus membranes or skin necrosis: No Has patient had a PCN reaction that required hospitalization No Has patient had a PCN reaction occurring within the last 10 years: Yes  If all of the above answers are "NO", then may proceed with Cephalosporin use.   . Buprenorphine Hcl Rash  . Encainide Nausea And Vomiting    Consultations:  None   Procedures/Studies: Ir Fluoro Guide Cv Line Right  Result Date: 03/06/2017 INDICATION: DIABETES, DKA, ACCESS FOR IV THERAPY AND HYDRATION EXAM: ULTRASOUND AND FLUOROSCOPIC GUIDED PICC LINE INSERTION MEDICATIONS: 1%  PROCAINE LOCALLY CONTRAST:  None FLUOROSCOPY TIME:  Six seconds (less than 1 mGy) COMPLICATIONS: None immediate. TECHNIQUE: The procedure, risks, benefits, and alternatives were explained to the patient and informed written consent was obtained. A timeout was performed prior to the initiation of the procedure. The right upper extremity was prepped with chlorhexidine in a sterile fashion, and a sterile drape was applied covering the operative field. Maximum barrier sterile technique with sterile gowns and gloves were used for the procedure. A timeout was performed prior to the initiation of the procedure. Local anesthesia was provided with 1% lidocaine. Under direct ultrasound guidance, the right brachial vein was accessed with a micropuncture kit after the overlying soft tissues were anesthetized with 1% lidocaine. An ultrasound image was saved for documentation purposes. A guidewire was advanced to the level of the superior caval-atrial junction for measurement purposes and the PICC line was cut to length. A peel-away sheath was placed and a 33 cm, 5 [place], dual lumen was inserted to level of the superior  caval-atrial junction. A post procedure spot fluoroscopic was obtained. The catheter easily aspirated and flushed and was sutured in place. A dressing was placed. The patient tolerated the procedure well without immediate post procedural complication. FINDINGS: After catheter placement, the tip lies within the superior cavoatrial junction. The catheter aspirates and flushes normally and is ready for immediate use. IMPRESSION: Successful ultrasound and fluoroscopic guided placement of a right brachial vein approach, 33 cm, 5 French, dual lumen PICC with tip at the superior caval-atrial junction. The PICC line is ready for immediate use. Electronically Signed   By: Jerilynn Mages.  Shick M.D.   On: 03/06/2017 12:24   Ir US Guide Vasc Access Right  Result Date: 03/06/2017 INDICATION: DIABETES, DKA, ACCESS FOR IV THERAPY AND HYDRATION EXAM: ULTRASOUND AND FLUOROSCOPIC GUIDED PICC LINE INSERTION MEDICATIONS: 1% PROCAINE LOCALLY CONTRAST:  None FLUOROSCOPY TIME:  Six seconds (less than 1 mGy) COMPLICATIONS: None immediate. TECHNIQUE: The procedure, risks, benefits, and alternatives were explained to the patient and informed written consent was obtained. A timeout was performed prior to the initiation of the procedure. The right upper extremity was prepped with chlorhexidine in a sterile fashion, and a sterile drape was applied covering the operative field. Maximum barrier sterile technique with sterile gowns and gloves were used for the procedure. A timeout was performed prior to the initiation of the procedure. Local anesthesia was provided with 1% lidocaine. Under direct ultrasound guidance, the right brachial vein was accessed with a micropuncture kit after the overlying soft tissues were anesthetized with 1% lidocaine. An ultrasound image was saved for documentation purposes. A guidewire was advanced to the level of the superior caval-atrial junction for measurement purposes and the PICC line was cut to length. A peel-away  sheath was placed and a 33 cm, 5 [place], dual lumen was inserted to level of the superior caval-atrial junction. A post procedure spot fluoroscopic was obtained. The catheter easily aspirated and flushed and was sutured in place. A dressing was placed. The patient tolerated the procedure well without immediate post procedural complication. FINDINGS: After catheter placement, the tip lies within the superior cavoatrial junction. The catheter aspirates and flushes normally and is ready for immediate use. IMPRESSION: Successful ultrasound and fluoroscopic guided placement of a right brachial vein approach, 33 cm, 5 French, dual lumen PICC with tip at the superior caval-atrial junction. The PICC line is ready for immediate use. Electronically Signed   By: Jerilynn Mages.  Shick M.D.   On: 03/06/2017 12:24  Dg Chest Port 1 View  Result Date: 03/07/2017 CLINICAL DATA:  Cough. EXAM: PORTABLE CHEST 1 VIEW COMPARISON:  01/31/2017. FINDINGS: Right PICC line in stable position. Heart size normal. Mild right base subsegmental atelectasis. No focal infiltrate. No pleural effusion or pneumothorax. Thoracic spine scoliosis. IMPRESSION: 1. Right PICC line stable position. 2.  Mild right base subsegmental atelectasis. Electronically Signed   By: Marcello Moores  Register   On: 03/07/2017 06:28     Subjective: No new complaints.  Discharge Exam: Vitals:   03/07/17 1955 03/08/17 0434  BP: (!) 162/74 137/74  Pulse: 99 95  Resp: 19 18  Temp: 98.2 F (36.8 C) 98.3 F (36.8 C)  SpO2: 100% 100%   Vitals:   03/07/17 1600 03/07/17 1700 03/07/17 1955 03/08/17 0434  BP: (!) 186/79  (!) 162/74 137/74  Pulse: (!) 102  99 95  Resp: _0 Temp:  98.2 F (36.8 C) 98.2 F (36.8 C) 98.3 F (36.8 C)  TempSrc:  Oral Oral Oral  SpO2: 100%  100% 100%  Weight:      Height:        General: Pt is alert, awake, not in acute distress Cardiovascular: RRR, S1/S2 +, no rubs, no gallops Respiratory: CTA bilaterally, no wheezing, no  rhonchi Abdominal: Soft, NT, ND, bowel sounds + Extremities: no edema, no cyanosis    The results of significant diagnostics from this hospitalization (including imaging, microbiology, ancillary and laboratory) are listed below for reference.     Microbiology: Recent Results (from the past 240 hour(s))  MRSA PCR Screening     Status: None   Collection Time: 03/05/17  6:34 PM  Result Value Ref Range Status   MRSA by PCR NEGATIVE NEGATIVE Final    Comment:        The GeneXpert MRSA Assay (FDA approved for NASAL specimens only), is one component of a comprehensive MRSA colonization surveillance program. It is not intended to diagnose MRSA infection nor to guide or monitor treatment for MRSA infections.   Urine culture     Status: Abnormal   Collection Time: 03/05/17  6:35 PM  Result Value Ref Range Status   Specimen Description URINE, RANDOM  Final   Special Requests NONE  Final   Culture MULTIPLE SPECIES PRESENT, SUGGEST RECOLLECTION (A)  Final   Report Status 03/07/2017 FINAL  Final     Labs: BNP (last 3 results) No results for input(s): BNP in the last 8760 hours. Basic Metabolic Panel:  Recent Labs Lab 03/06/17 1049  03/07/17 0351 03/07/17 0836 03/07/17 1149 03/07/17 1933 03/08/17 0000  NA 144  < > 137 133* 131* 134* 135  K 4.3  < > 3.4* 3.2* 3.6 3.6 3.8  CL 120*  < > 110 105 104 105 105  CO2 14*  < > 19* 21* 21* 22 24  GLUCOSE 114*  < > 142* 279* 308* 124* 149*  BUN 15  < > _1 CREATININE 0.90  < > 0.90 0.86 0.85 0.82 0.97  CALCIUM 9.3  < > 8.9 8.5* 8.4* 8.9 8.7*  MG 1.9  --   --   --  1.5*  --   --   < > = values in this interval not displayed. Liver Function Tests:  Recent Labs Lab 03/05/17 1252  AST 30  ALT 17  ALKPHOS 94  BILITOT 1.4*  PROT 7.9  ALBUMIN 4.0    Recent Labs Lab 03/05/17 1252  LIPASE 22  No results for input(s): AMMONIA in the last 168 hours. CBC:  Recent Labs Lab 03/05/17 1252 03/06/17 0334  WBC 21.2*  21.0*  HGB 9.7* 9.3*  HCT 33.0* 29.5*  MCV 74.0* 71.1*  PLT 464* 380   Cardiac Enzymes: No results for input(s): CKTOTAL, CKMB, CKMBINDEX, TROPONINI in the last 168 hours. BNP: Invalid input(s): POCBNP CBG:  Recent Labs Lab 03/07/17 1140 03/07/17 1300 03/07/17 1751 03/07/17 2148 03/08/17 0408  GLUCAP 198* 181* 140* 104* 172*   D-Dimer No results for input(s): DDIMER in the last 72 hours. Hgb A1c No results for input(s): HGBA1C in the last 72 hours. Lipid Profile No results for input(s): CHOL, HDL, LDLCALC, TRIG, CHOLHDL, LDLDIRECT in the last 72 hours. Thyroid function studies No results for input(s): TSH, T4TOTAL, T3FREE, THYROIDAB in the last 72 hours.  Invalid input(s): FREET3 Anemia work up No results for input(s): VITAMINB12, FOLATE, FERRITIN, TIBC, IRON, RETICCTPCT in the last 72 hours. Urinalysis    Component Value Date/Time   COLORURINE STRAW (A) 03/05/2017 1835   APPEARANCEUR CLEAR 03/05/2017 1835   LABSPEC 1.017 03/05/2017 1835   PHURINE 5.0 03/05/2017 1835   GLUCOSEU >=500 (A) 03/05/2017 1835   HGBUR SMALL (A) 03/05/2017 1835   BILIRUBINUR NEGATIVE 03/05/2017 1835   BILIRUBINUR neg 01/17/2017 1533   KETONESUR 80 (A) 03/05/2017 1835   PROTEINUR 30 (A) 03/05/2017 1835   UROBILINOGEN 0.2 01/17/2017 1533   UROBILINOGEN 0.2 04/27/2015 1608   NITRITE NEGATIVE 03/05/2017 1835   LEUKOCYTESUR NEGATIVE 03/05/2017 1835   Sepsis Labs Invalid input(s): PROCALCITONIN,  WBC,  LACTICIDVEN Microbiology Recent Results (from the past 240 hour(s))  MRSA PCR Screening     Status: None   Collection Time: 03/05/17  6:34 PM  Result Value Ref Range Status   MRSA by PCR NEGATIVE NEGATIVE Final    Comment:        The GeneXpert MRSA Assay (FDA approved for NASAL specimens only), is one component of a comprehensive MRSA colonization surveillance program. It is not intended to diagnose MRSA infection nor to guide or monitor treatment for MRSA infections.   Urine  culture     Status: Abnormal   Collection Time: 03/05/17  6:35 PM  Result Value Ref Range Status   Specimen Description URINE, RANDOM  Final   Special Requests NONE  Final   Culture MULTIPLE SPECIES PRESENT, SUGGEST RECOLLECTION (A)  Final   Report Status 03/07/2017 FINAL  Final     Time coordinating discharge: Over 30 minutes  SIGNED:   Charlynne Cousins, MD  [facility] 03/08/2017, 7:27 AM Pager   If 7PM-7AM, please contact night-coverage [email or web address] Password [email or web address]

## 2017-03-09 LAB — GLUCOSE, CAPILLARY
GLUCOSE-CAPILLARY: 120 mg/dL — AB (ref 65–99)
Glucose-Capillary: 176 mg/dL — ABNORMAL HIGH (ref 65–99)
Glucose-Capillary: 160 mg/dL — ABNORMAL HIGH (ref 65–99)

## 2017-03-09 MED ORDER — LORAZEPAM 0.5 MG PO TABS
0.5000 mg | ORAL_TABLET | Freq: Once | ORAL | Status: AC
  Administered 2017-03-09: 0.5 mg via ORAL
  Filled 2017-03-09: qty 1

## 2017-03-09 MED ORDER — ERYTHROMYCIN ETHYLSUCCINATE 200 MG/5ML PO SUSR
200.0000 mg | Freq: Four times a day (QID) | ORAL | Status: DC
  Administered 2017-03-09: 200 mg via ORAL
  Filled 2017-03-09 (×2): qty 5

## 2017-03-09 NOTE — Progress Notes (Signed)
[facility] PROGRESS NOTE    Progress Note  [name]  UXN:[id number] DOB: [birth year] DOA: 03/05/2017 PCP: [name], MD     Brief Narrative:   [name] is an 51 y.o. female past medical history of type 1 diabetes mellitus with diabetic gastroparesis and peripheral neuropathy with last A1c of 10,comes into the hospital in DKA.  Assessment/Plan:   Diabetic ketoacidosis without coma associated with type 1 diabetes mellitus ([place]) Gross well-controlled, unclear etiology question noncompliance.  Nausea and vomiting intractable: Likely secondary to DKA. Electrodes were within normal she was started on her Reglan and she started to eat slowly. She was not discharge on 03/08/2017 due to her poor appetite.  Acute kidney injury: Likely due to hypovolemia baseline creatinine is less than 1.  Leukocytosis: Has remained afebrile continues to have a white count of 21. Likely stress demargination today's 9.  Iron deficiency anemia: Hemoglobin seems to be at baseline. Will need to be discharge on ferrous sulfate.  Diabetic neuropathy, type I diabetes mellitus ([place])  Questionable diarrhea: Single soft bowel movement today, on admission she came in with diarrhea likely due to DKA.  Diabetic gastroparesis ([place]): Increase Zofran she continues to vomit. cont  home dose of Reglan.   DVT prophylaxis: lovenox Family Communication:none Disposition Plan/Barrier to D/C: Home today. Code Status:     Code Status Orders        Start     Ordered   03/05/17 1837  Full code  Continuous     03/05/17 1836    Code Status History    Date Active Date Inactive Code Status Order ID Comments User Context   02/05/2017 11:18 AM 02/06/2017  5:06 PM Full Code [id number]  Merton Border, MD Inpatient   01/31/2017  9:22 PM 02/03/2017  6:47 PM Full Code [id number]  Vashti Hey, MD Inpatient   09/27/2016  6:33 PM 09/29/2016  8:30 PM Full Code [id number]  Doreatha Lew, MD  Inpatient   04/20/2016  6:09 PM 04/23/2016  7:37 PM Full Code [id number]  Hosie Poisson, MD Inpatient   04/06/2016  1:50 PM 04/09/2016  6:29 PM Full Code [id number]  Elgergawy, Silver Huguenin, MD ED   03/23/2016  1:22 AM 03/26/2016  9:01 PM Full Code [id number]  Vianne Bulls, MD ED   03/18/2016  3:59 PM 03/20/2016  5:41 PM Full Code [id number]  Mariel Aloe, MD Inpatient   02/19/2016  2:16 PM 02/21/2016  5:32 PM Full Code [id number]  Caren Griffins, MD ED   01/24/2016  7:01 AM 01/26/2016  8:01 PM Full Code [id number]  Karmen Bongo, MD Inpatient   01/19/2016  2:23 PM 01/21/2016  5:38 PM Full Code [id number]  Thurnell Lose, MD ED   11/27/2015 10:01 PM 12/01/2015  6:39 PM Full Code [id number]  Toy Baker, MD Inpatient   11/14/2015  4:05 AM 11/16/2015  7:31 PM Full Code [id number]  Ivor Costa, MD ED   09/02/2015 12:33 PM 09/05/2015 10:12 PM Full Code [id number]  Kelvin Cellar, MD ED   07/29/2015  7:55 AM 08/01/2015 11:03 PM Full Code [id number]  Reyne Dumas, MD ED   06/29/2015  8:27 PM 07/03/2015  5:58 PM Full Code [id number]  Robbie Lis, MD Inpatient   06/22/2015 10:58 AM 06/25/2015  6:47 PM Full Code [id number]  Radene Gunning, NP Inpatient   06/14/2015  6:29 PM 06/20/2015  9:40 PM Full Code [id number]  Domenic Polite, MD Inpatient   05/26/2015 10:02 PM 06/05/2015  7:29 PM  Full Code [id number]  Reubin Milan, MD Inpatient   05/23/2015  7:17 AM 05/24/2015  6:19 PM Full Code [id number]  Norval Morton, MD Inpatient   05/17/2015 12:21 PM 05/20/2015  6:13 PM Full Code [id number]  Eugenie Filler, MD Inpatient   05/17/2015 11:09 AM 05/17/2015 12:21 PM Full Code [id number]  Eugenie Filler, MD ED   04/19/2015  2:38 AM 04/20/2015 10:29 PM Full Code [id number]  Rise Patience, MD Inpatient   04/05/2015  5:43 AM 04/09/2015  7:25 PM Full Code [id number]  Rise Patience, MD Inpatient   04/05/2015  5:37 AM 04/05/2015  5:43 AM Full Code [id number]  Despina Hick, RN Inpatient   03/09/2015  7:42 AM  03/12/2015  7:11 PM Full Code [id number]  Theodis Blaze, MD Inpatient   02/20/2015  1:08 AM 02/22/2015  7:08 PM Full Code [id number]  Toy Baker, MD Inpatient   02/08/2015 11:43 PM 02/13/2015  3:32 PM Full Code [id number]  Lavina Hamman, MD ED   10/21/2014 12:57 AM 10/23/2014  3:14 PM Full Code [id number]  Rise Patience, MD Inpatient   02/12/2013 11:16 PM 02/17/2013  9:22 PM Full Code [id number]  Quintella Baton, MD Inpatient   11/25/2012  1:48 PM 11/28/2012  7:04 PM Full Code [id number]  Elmarie Shiley, MD ED   11/05/2012  5:37 PM 11/07/2012  4:20 PM Full Code [id number]  Sheila Oats, MD Inpatient   04/03/2012  2:27 AM 04/05/2012  4:43 PM Full Code [id number]  Quintella Baton, MD Inpatient   03/24/2012 12:21 AM 03/28/2012  5:51 PM Full Code [id number]  Earline Mayotte II, RN ED        IV Access:    Peripheral IV   Procedures and diagnostic studies:   No results found.   Medical Consultants:    None.  Anti-Infectives:    Subjective:    [name] she's had some nausea.  Objective:    Vitals:   03/08/17 0434 03/08/17 1346 03/08/17 2004 03/09/17 0553  BP: 137/74 (!) 162/81 (!) 164/84 (!) 146/79  Pulse: 95 (!) 107 (!) 103 98  Resp: 18 16 18 18   Temp: 98.3 F (36.8 C) 98.4 F (36.9 C) 99.1 F (37.3 C) 98.9 F (37.2 C)  TempSrc: Oral Oral Oral Oral  SpO2: 100% 100% 100% 100%  Weight:      Height:       No intake or output data in the 24 hours ending 03/09/17 0736 Filed Weights   03/05/17 1838  Weight: 60 kg (132 lb 4.4 oz)    Exam: General exam:in no acute distress. Respiratory system: good air movement and clear to auscultation. Cardiovascular system: regular rate and rhythm with positive S1-S2. Gastrointestinal system:positive bowel sounds soft nontender. Central nervous system: he is awake alert and oriented 3. Extremities: No pedal edema. Skin: No rashes, lesions or ulcers Psychiatry: Judgement and insight appear normal. Mood & affect  appropriate.    Data Reviewed:    Labs: Basic Metabolic Panel:  Recent Labs Lab 03/06/17 1049  03/07/17 0836 03/07/17 1149 03/07/17 1933 03/08/17 0000 03/08/17 0812  NA 144  < > 133* 131* 134* 135 135  K 4.3  < > 3.2* 3.6 3.6 3.8 3.6  CL 120*  < > 105 104 105 105 103  CO2 14*  < > 21* 21* 22 24 23   GLUCOSE 114*  < > 279* 308* 124* 149* 155*  BUN 15  < > 8 7 6  8  9  CREATININE 0.90  < > 0.86 0.85 0.82 0.97 1.00  CALCIUM 9.3  < > 8.5* 8.4* 8.9 8.7* 8.9  MG 1.9  --   --  1.5*  --   --   --   < > = values in this interval not displayed. GFR Estimated Creatinine Clearance: 53.8 mL/min (by C-G formula based on SCr of 1 mg/dL). Liver Function Tests:  Recent Labs Lab 03/05/17 1252  AST 30  ALT 17  ALKPHOS 94  BILITOT 1.4*  PROT 7.9  ALBUMIN 4.0    Recent Labs Lab 03/05/17 1252  LIPASE 22   No results for input(s): AMMONIA in the last 168 hours. Coagulation profile No results for input(s): INR, PROTIME in the last 168 hours.  CBC:  Recent Labs Lab 03/05/17 1252 03/06/17 0334 03/08/17 1345  WBC 21.2* 21.0* 9.3  HGB 9.7* 9.3* 9.7*  HCT 33.0* 29.5* 31.2*  MCV 74.0* 71.1* 71.6*  PLT 464* 380 371   Cardiac Enzymes: No results for input(s): CKTOTAL, CKMB, CKMBINDEX, TROPONINI in the last 168 hours. BNP (last 3 results) No results for input(s): PROBNP in the last 8760 hours. CBG:  Recent Labs Lab 03/08/17 0408 03/08/17 0813 03/08/17 1148 03/08/17 1646 03/08/17 2140  GLUCAP 172* 155* 249* 112* 88   D-Dimer: No results for input(s): DDIMER in the last 72 hours. Hgb A1c: No results for input(s): HGBA1C in the last 72 hours. Lipid Profile: No results for input(s): CHOL, HDL, LDLCALC, TRIG, CHOLHDL, LDLDIRECT in the last 72 hours. Thyroid function studies: No results for input(s): TSH, T4TOTAL, T3FREE, THYROIDAB in the last 72 hours.  Invalid input(s): FREET3 Anemia work up: No results for input(s): VITAMINB12, FOLATE, FERRITIN, TIBC, IRON,  RETICCTPCT in the last 72 hours. Sepsis Labs:  Recent Labs Lab 03/05/17 1252 03/06/17 0334 03/08/17 1345  WBC 21.2* 21.0* 9.3   Microbiology Recent Results (from the past 240 hour(s))  MRSA PCR Screening     Status: None   Collection Time: 03/05/17  6:34 PM  Result Value Ref Range Status   MRSA by PCR NEGATIVE NEGATIVE Final    Comment:        The GeneXpert MRSA Assay (FDA approved for NASAL specimens only), is one component of a comprehensive MRSA colonization surveillance program. It is not intended to diagnose MRSA infection nor to guide or monitor treatment for MRSA infections.   Urine culture     Status: Abnormal   Collection Time: 03/05/17  6:35 PM  Result Value Ref Range Status   Specimen Description URINE, RANDOM  Final   Special Requests NONE  Final   Culture MULTIPLE SPECIES PRESENT, SUGGEST RECOLLECTION (A)  Final   Report Status 03/07/2017 FINAL  Final     Medications:   . atorvastatin  40 mg Oral Daily  . busPIRone  10 mg Oral BID  . Chlorhexidine Gluconate Cloth  6 each Topical Daily  . erythromycin ethylsuccinate  200 mg Oral Q6H  . FLUoxetine  40 mg Oral Daily  . heparin  5,000 Units Subcutaneous Q8H  . Influenza vac split quadrivalent PF  0.5 mL Intramuscular Tomorrow-1000  . insulin aspart  0-15 Units Subcutaneous TID WC  . insulin aspart  0-5 Units Subcutaneous QHS  . insulin aspart  3 Units Subcutaneous TID WC  . insulin detemir  12 Units Subcutaneous BID  . lisinopril  5 mg Oral Daily  . metoCLOPramide  10 mg Oral TID AC & HS   Continuous Infusions: . ondansetron (ZOFRAN) IV  8 mg (03/07/17 0347)      LOS: 4 days   Charlynne Cousins  [facility] Pager [phone]  *Please refer to [email or web address], password [email or web address] to get updated schedule on who will round on this patient, as hospitalists switch teams weekly. If 7PM-7AM, please contact night-coverage at [email or web address], password [email or web address] for any overnight needs.  03/09/2017, 7:36 AM

## 2017-03-09 NOTE — Progress Notes (Signed)
Patient contacted her sister, will be here in an hour to pick her up.

## 2017-03-09 NOTE — Progress Notes (Signed)
Provided patient with discharge instructions, went over meds one by one. All questions answered appropriately. Slow to respond, but verbalized understanding. Explained to her that Lisinopril was already sent to pharmacy of choice, she understood. Patient will call sister or niece to transport her home.

## 2017-03-09 NOTE — Evaluation (Signed)
Physical Therapy Evaluation Patient Details Name: [name] MRN: [id number] DOB: 07-06-1965 Today's Date: 03/09/2017   History of Present Illness  [name] is an 51 y.o. female past medical history of type 1 diabetes mellitus with diabetic gastroparesis and peripheral neuropathy with last A1c of 10,comes into the hospital in DKA.  Clinical Impression  Pt with some generalized weakness and stiffness with all mobility due to not feeling well for last 4 days and also in general for last 3 months per patient. Pt was slow to move , however improved with more movement we performed. She does have limited (painful arc as well) in shoulder ROM R > than L , and fatigues easily with ambulation. Wanted cane for stability so cane was given to patient that had been donated to our department. Pt ws educated with home exercise program and encouraged to keep moving to progress to return to her normal baseline. Pt agrees and understands. At this time patient being DC's after our session.     Follow Up Recommendations No PT follow up (pt wants to try to get back to her baseline at home independently.)    Equipment Recommendations  Cane (donated cane was provided to patient to use for home)    Recommendations for Other Services       Precautions / Restrictions Precautions Precautions: None Restrictions Weight Bearing Restrictions: No      Mobility  Bed Mobility Overal bed mobility: Independent                Transfers Overall transfer level: Modified independent Equipment used: None             General transfer comment: slow to rise but stedy  Ambulation/Gait Ambulation/Gait assistance: Min guard Ambulation Distance (Feet): 100 Feet Assistive device: Rolling walker (2 wheeled);Straight cane Gait Pattern/deviations: Step-through pattern     General Gait Details: slow steady but small, stiff steps, improved with increased distance. Pt reproted she felt a little winded.  Tried no AD , then RW , and then a cane. Pt wants to try cane for home use. We had a donated cane I was able to supple to patient for her to use for home.   Stairs            Wheelchair Mobility    Modified Rankin (Stroke Patients Only)       Balance                                             Pertinent Vitals/Pain Pain Assessment: No/denies pain    Home Living Family/patient expects to be discharged to:: Private residence Living Arrangements: Other relatives (lives with sister who works ) Available Help at Discharge: Family Type of Home: Apartment Home Access: Stairs to enter Entrance Stairs-Rails: Right Entrance Stairs-Number of Steps: [place]: One level Home Equipment: None      Prior Function Level of Independence: Independent         Comments: pt stated she used to walk around the neighborhood but has not felt well for past 3 months and also for last 4 days have not felt like moving at all.      Hand Dominance        Extremity/Trunk Assessment        Lower Extremity Assessment Lower Extremity Assessment: Generalized weakness (generalized weakness, in UEs greater than LEs. shoulder tighttness  with limited elevation no greater than 90 degrees R worse than left. Pt reports difficulty with putting her jacket on. )       Communication   Communication: No difficulties (very slow with responses )  Cognition Arousal/Alertness: Awake/alert Behavior During Therapy: WFL for tasks assessed/performed Overall Cognitive Status: Within Functional Limits for tasks assessed                                        General Comments      Exercises Other Exercises Other Exercises: performed seated ankle flexion, knee extension, hip flexion, arm front horizontal elevation and lateral elevation. Discussed home exercise program with shoulder movements supine in bed as well in sitting, and LEs exercises 2x a day , with small  walks as well.    Assessment/Plan    PT Assessment Patent does not need any further PT services (pt being discharged at this time , so not further PT in this venue. )  PT Problem List         PT Treatment Interventions      PT Goals (Current goals can be found in the Care Plan section)  Acute Rehab PT Goals Patient Stated Goal: during this session to educate patient with safe mobility, and home exercise plan  PT Goal Formulation: All assessment and education complete, DC therapy    Frequency     Barriers to discharge        Co-evaluation               AM-PAC PT "6 Clicks" Daily Activity  Outcome Measure Difficulty turning over in bed (including adjusting bedclothes, sheets and blankets)?: None Difficulty moving from lying on back to sitting on the side of the bed? : None Difficulty sitting down on and standing up from a chair with arms (e.g., wheelchair, bedside commode, etc,.)?: None Help needed moving to and from a bed to chair (including a wheelchair)?: None Help needed walking in hospital room?: None Help needed climbing 3-5 steps with a railing? : A Little 6 Click Score: 23    End of Session Equipment Utilized During Treatment: Gait belt Activity Tolerance: Patient tolerated treatment well Patient left: in bed Nurse Communication: Mobility status PT Visit Diagnosis: Unsteadiness on feet (R26.81);Muscle weakness (generalized) (M62.81)    Time: 0915-1000 PT Time Calculation (min) (ACUTE ONLY): 45 min   Charges:   PT Evaluation $PT Eval Low Complexity: 1 Low PT Treatments $Gait Training: 8-22 mins $Therapeutic Exercise: 8-22 mins   PT G Codes:   PT G-Codes **NOT FOR INPATIENT CLASS** Functional Assessment Tool Used: AM-PAC 6 Clicks Basic Mobility;Clinical judgement Functional Limitation: Mobility: Walking and moving around Mobility: Walking and Moving Around Current Status (W0981): At least 1 percent but less than 20 percent impaired, limited or  restricted Mobility: Walking and Moving Around Goal Status [phone]): At least 1 percent but less than 20 percent impaired, limited or restricted Mobility: Walking and Moving Around Discharge Status [phone]): At least 1 percent but less than 20 percent impaired, limited or restricted    Clide Dales, PT Pager: [phone] 03/09/2017   [name] Pollitt, Gatha Mayer 03/09/2017, 10:27 AM

## 2017-03-09 NOTE — Progress Notes (Signed)
Patient d/c home. Stable. 

## 2017-03-11 ENCOUNTER — Telehealth: Payer: Self-pay

## 2017-03-11 NOTE — Telephone Encounter (Signed)
[facility] Post-discharge Follow-Up Phone Call:  The patient was known to the [facility] prior to her hospitalization.   Date of Discharge: 03/09/17 Principal Discharge Diagnosis(es):DKA Post-discharge Communication: (Clearly document all attempts clearly and date contact made)  - Attempted to contact the patient to check on her and to discuss follow up medical care after her hospitalization. Call placed to # [phone] and a HIPAA compliant voicemail message was left requesting a call back to # [phone]. Call Completed: No    Call placed to Marcelino Scot, [place] to update her on the patient's discharge and inform her that this CM was not able to reach the patient today.

## 2017-03-15 ENCOUNTER — Inpatient Hospital Stay (HOSPITAL_COMMUNITY)
Admission: EM | Admit: 2017-03-15 | Discharge: 2017-03-20 | DRG: 639 | Disposition: A | Discharge status: Home / Self Care | Payer: Medicaid Other | Attending: Internal Medicine | Admitting: Internal Medicine

## 2017-03-15 DIAGNOSIS — Z833 Family history of diabetes mellitus: Secondary | ICD-10-CM

## 2017-03-15 DIAGNOSIS — D509 Iron deficiency anemia, unspecified: Secondary | ICD-10-CM | POA: Diagnosis present

## 2017-03-15 DIAGNOSIS — Z9114 Patient's other noncompliance with medication regimen: Secondary | ICD-10-CM

## 2017-03-15 DIAGNOSIS — E860 Dehydration: Secondary | ICD-10-CM | POA: Diagnosis present

## 2017-03-15 DIAGNOSIS — Z79899 Other long term (current) drug therapy: Secondary | ICD-10-CM

## 2017-03-15 DIAGNOSIS — Z794 Long term (current) use of insulin: Secondary | ICD-10-CM

## 2017-03-15 DIAGNOSIS — E1069 Type 1 diabetes mellitus with other specified complication: Secondary | ICD-10-CM | POA: Diagnosis present

## 2017-03-15 DIAGNOSIS — Z880 Allergy status to penicillin: Secondary | ICD-10-CM

## 2017-03-15 DIAGNOSIS — E1043 Type 1 diabetes mellitus with diabetic autonomic (poly)neuropathy: Secondary | ICD-10-CM | POA: Diagnosis present

## 2017-03-15 DIAGNOSIS — I10 Essential (primary) hypertension: Secondary | ICD-10-CM | POA: Diagnosis present

## 2017-03-15 DIAGNOSIS — R000 Tachycardia, unspecified: Secondary | ICD-10-CM

## 2017-03-15 DIAGNOSIS — K219 Gastro-esophageal reflux disease without esophagitis: Secondary | ICD-10-CM | POA: Diagnosis present

## 2017-03-15 DIAGNOSIS — F419 Anxiety disorder, unspecified: Secondary | ICD-10-CM | POA: Diagnosis present

## 2017-03-15 DIAGNOSIS — K3184 Gastroparesis: Secondary | ICD-10-CM | POA: Diagnosis present

## 2017-03-15 DIAGNOSIS — R739 Hyperglycemia, unspecified: Secondary | ICD-10-CM

## 2017-03-15 DIAGNOSIS — Z9889 Other specified postprocedural states: Secondary | ICD-10-CM

## 2017-03-15 DIAGNOSIS — E1010 Type 1 diabetes mellitus with ketoacidosis without coma: Principal | ICD-10-CM | POA: Diagnosis present

## 2017-03-15 DIAGNOSIS — R8290 Unspecified abnormal findings in urine: Secondary | ICD-10-CM | POA: Diagnosis present

## 2017-03-15 DIAGNOSIS — E785 Hyperlipidemia, unspecified: Secondary | ICD-10-CM | POA: Diagnosis present

## 2017-03-15 DIAGNOSIS — Z888 Allergy status to other drugs, medicaments and biological substances status: Secondary | ICD-10-CM

## 2017-03-15 DIAGNOSIS — Z8249 Family history of ischemic heart disease and other diseases of the circulatory system: Secondary | ICD-10-CM

## 2017-03-15 LAB — CBG MONITORING, ED: Glucose-Capillary: 471 mg/dL — ABNORMAL HIGH (ref 65–99)

## 2017-03-15 MED ORDER — ONDANSETRON 4 MG PO TBDP
4.0000 mg | ORAL_TABLET | Freq: Once | ORAL | Status: DC
Start: 2017-03-16 — End: 2017-03-16

## 2017-03-15 NOTE — ED Notes (Signed)
Bed: TA56 Expected date:  Expected time:  Means of arrival:  Comments: EMS 51 yo female from home vomiting all day-gastroparesis and diabetes-CBG 479-just released from here Saturday

## 2017-03-15 NOTE — ED Provider Notes (Signed)
[facility] DEPT Provider Note   CSN: [id number] Arrival date & time: 03/15/17  2204   History   Chief Complaint Chief Complaint  Patient presents with  . Emesis    HPI [name] is a 51 y.o. female.  HPI  Level 5 caveat due to acuity    51 year old female presents today with complaints of abdominal pain nausea and vomiting.  Patient reports this abdominal pain is similar to previous abdominal pain.  She notes the pain started earlier in the day.  She is unable to provide any other significant details to me about her condition as she continues to have dry heaving.   Past Medical History:  Diagnosis Date  . Depression   . Diabetes mellitus without complication ([place])   . Essential hypertension   . Gastroparesis   . GERD (gastroesophageal reflux disease)   . HLD (hyperlipidemia)     Patient Active Problem List   Diagnosis Date Noted  . Diarrhea 03/05/2017  . Hypokalemia 02/06/2017  . Intractable nausea and vomiting 02/05/2017  . Gastroparesis 04/20/2016  . Type 1 diabetes mellitus with ketoacidosis without coma ([place])   . DKA (diabetic ketoacidoses) ([place]) 03/18/2016  . Noncompliance with treatment plan 03/13/2016  . Essential hypertension 01/24/2016  . Leukocytosis 01/24/2016  . Abnormal ECG 11/27/2015  . HLD (hyperlipidemia)   . GERD (gastroesophageal reflux disease)   . Metabolic acidosis, normal anion gap (NAG)   . AKI (acute kidney injury) ([place])   . Anemia, iron deficiency   . DKA, type 1, not at goal [facility])   . Hyponatremia 09/02/2015  . Vitamin B12 deficiency 08/16/2015  . Lactic acidosis 07/29/2015  . Acute pyelonephritis 07/29/2015  . Pyelonephritis   . Hypothermia   . Diabetic gastroparesis ([place])   . Diabetic neuropathy, type I diabetes mellitus ([place]) 05/18/2015  . Anemia 05/18/2015  . Anxiety and depression 05/18/2015  . Nausea & vomiting   . Diabetic ketoacidosis without coma associated with type 1 diabetes mellitus ([place])   . High anion gap  metabolic acidosis 09/73/5329    Past Surgical History:  Procedure Laterality Date  . COLONOSCOPY  09/27/2014   at [facility]  . ESOPHAGOGASTRODUODENOSCOPY  09/27/2014   at [facility], Dr Rolan Lipa. biospy neg for celiac, neg for H pylori.   . EYE SURGERY    . gailstones    . IR FLUORO GUIDE CV LINE RIGHT  02/01/2017  . IR FLUORO GUIDE CV LINE RIGHT  03/06/2017  . IR GENERIC HISTORICAL  01/24/2016   IR FLUORO GUIDE CV LINE RIGHT 01/24/2016 Darrell K Allred, PA-C [facility]-INTERV RAD  . IR GENERIC HISTORICAL  01/24/2016   IR US GUIDE VASC ACCESS RIGHT 01/24/2016 Darrell K Allred, PA-C [facility]-INTER[facility] RAD  . IR US GUIDE VASC ACCESS RIGHT  02/01/2017  . IR US GUIDE VASC ACCESS RIGHT  03/06/2017  . POSTERIOR VITRECTOMY AND MEMBRANE PEEL-LEFT EYE  09/28/2002  . POSTERIOR VITRECTOMY AND MEMBRANE PEEL-RIGHT EYE  03/16/2002  . RETINAL DETACHMENT SURGERY      OB History    No data available       Home Medications    Prior to Admission medications   Medication Sig Start Date End Date Taking? Authorizing Provider  albuterol (PROVENTIL HFA;VENTOLIN HFA) 108 (90 Base) MCG/ACT inhaler Inhale 2 puffs into the lungs every 6 (six) hours as needed for wheezing or shortness of breath. 10/20/15   [name], MD  atorvastatin (LIPITOR) 40 MG tablet Take 1 tablet (40 mg total) by mouth daily. 10/30/16  [name], MD  Blood Glucose Monitoring Suppl (ACCU-CHEK AVIVA) device Use as instructed 3 times daily before meals 10/30/16   [name], MD  busPIRone (BUSPAR) 10 MG tablet Take 1 tablet (10 mg total) by mouth 2 (two) times daily. 10/30/16   [name], MD  famotidine (PEPCID) 20 MG tablet Take 1 tablet (20 mg total) by mouth 2 (two) times daily. 09/29/16   Doreatha Lew, MD  ferrous sulfate 325 (65 FE) MG tablet Take 1 tablet (325 mg total) by mouth daily with breakfast. 03/27/16   Donne Hazel, MD  FLUoxetine (PROZAC) 20 MG tablet Take 2 tablets (40 mg total) by mouth daily. 10/30/16   [name], MD  furosemide  (LASIX) 40 MG tablet Take 40 mg by mouth daily as needed for fluid.     [provider]  glucose blood (ACCU-CHEK AVIVA) test strip Use as instructed 3 times daily before meals 10/30/16   [name], MD  insulin aspart (NOVOLOG) 100 UNIT/ML injection Inject 0-12 Units into the skin 3 (three) times daily with meals. Per sliding scale 10/30/16   [name], MD  insulin detemir (LEVEMIR) 100 UNIT/ML injection Inject 0.16 mLs (16 Units total) into the skin 2 (two) times daily. 02/03/17   Eugenie Filler, MD  Insulin Syringe-Needle U-100 (BD INSULIN SYRINGE ULTRAFINE) 31G X 15/64" 0.5 ML MISC 1 each by Does not apply route 4 (four) times daily as needed. 08/16/15   [name], MD  Lancet Devices [facility]) lancets Use as instructed 3 times daily before meals 10/30/16   [name], MD  lisinopril (PRINIVIL,ZESTRIL) 5 MG tablet Take 1 tablet (5 mg total) by mouth daily. 03/08/17   Charlynne Cousins, MD  LORazepam (ATIVAN) 1 MG tablet Take 1 tablet (1 mg total) by mouth every 8 (eight) hours as needed for anxiety. Patient not taking: Reported on 01/30/2017 01/09/17   Varney Biles, MD  metoCLOPramide (REGLAN) 5 MG/5ML solution Take 10 mLs (10 mg total) by mouth 4 (four) times daily -  before meals and at bedtime. 02/06/17   Charlynne Cousins, MD  ondansetron (ZOFRAN ODT) 4 MG disintegrating tablet Take 1 tablet (4 mg total) by mouth every 8 (eight) hours as needed for nausea or vomiting. 09/04/16   [name], MD  polyethylene glycol (MIRALAX / GLYCOLAX) packet Take 17 g by mouth daily as needed for mild constipation. 09/04/16   [name], MD  pregabalin (LYRICA) 150 MG capsule Take 1 capsule (150 mg total) by mouth 2 (two) times daily. 01/17/17   Argentina Donovan, PA-C  vitamin B-12 (CYANOCOBALAMIN) 1000 MCG tablet Take 1 tablet (1,000 mcg total) by mouth daily. 02/03/17   Eugenie Filler, MD    Family History Family History  Problem Relation Age of Onset  . Cystic  fibrosis Mother   . Hypertension Father   . Diabetes Brother   . Hypertension Maternal Grandmother     Social History Social History  Substance Use Topics  . Smoking status: Never Smoker  . Smokeless tobacco: Never Used  . Alcohol use No     Allergies   Anesthetics, amide; Penicillins; Buprenorphine hcl; and Encainide   Review of Systems Review of Systems  Unable to perform ROS: Acuity of condition     Physical Exam Updated Vital Signs BP (!) 154/67 (BP Location: Left Arm)   Pulse (!) 110   Resp 20   Ht 5\' 2"  (1.575 m)   Wt 59.9 kg (132 lb)  SpO2 100%   BMI 24.14 kg/m   Physical Exam  Constitutional: She is oriented to person, place, and time. She appears well-developed and well-nourished.  Dry heaving  HENT:  Head: Normocephalic and atraumatic.  Eyes: Pupils are equal, round, and reactive to light. Conjunctivae are normal. Right eye exhibits no discharge. Left eye exhibits no discharge. No scleral icterus.  Neck: Normal range of motion. No JVD present. No tracheal deviation present.  Pulmonary/Chest: Effort normal. No stridor.  Abdominal:  Diffuse abdominal tenderness to palpation  Neurological: She is alert and oriented to person, place, and time. Coordination normal.  Skin: Skin is warm.  Psychiatric: She has a normal mood and affect. Her behavior is normal. Judgment and thought content normal.  Nursing note and vitals reviewed.    ED Treatments / Results  Labs (all labs ordered are listed, but only abnormal results are displayed) Labs Reviewed  BASIC METABOLIC PANEL - Abnormal; Notable for the following:       Result Value   Chloride 95 (*)    CO2 18 (*)    Glucose, Bld 539 (*)    Creatinine, Ser 1.15 (*)    GFR calc non Af Amer 55 (*)    Anion gap 22 (*)    All other components within normal limits  CBC - Abnormal; Notable for the following:    WBC 17.0 (*)    Hemoglobin 10.0 (*)    HCT 33.0 (*)    MCV 72.7 (*)    MCH 22.0 (*)    RDW 16.7  (*)    Platelets 520 (*)    All other components within normal limits  CBG MONITORING, ED - Abnormal; Notable for the following:    Glucose-Capillary 471 (*)    All other components within normal limits  I-STAT CHEM 8, ED - Abnormal; Notable for the following:    Sodium 134 (*)    Chloride 99 (*)    Glucose, Bld 555 (*)    Calcium, Ion 1.09 (*)    TCO2 20 (*)    Hemoglobin 11.6 (*)    HCT 34.0 (*)    All other components within normal limits  URINALYSIS, ROUTINE W REFLEX MICROSCOPIC  RAPID URINE DRUG SCREEN, HOSP PERFORMED  I-STAT BETA HCG BLOOD, ED ([facility], [facility], [facility] ONLY)    EKG  EKG Interpretation None       Radiology No results found.  Procedures Procedures (including critical care time)  Medications Ordered in ED Medications  insulin regular (NOVOLIN R,HUMULIN R) 100 Units in sodium chloride 0.9 % 100 mL (1 Units/mL) infusion (not administered)  sodium chloride 0.9 % bolus 1,000 mL (not administered)    And  0.9 %  sodium chloride infusion (not administered)  dextrose 5 %-0.45 % sodium chloride infusion (not administered)  potassium chloride 10 mEq in 100 mL IVPB (not administered)  ondansetron (ZOFRAN) injection 4 mg (4 mg Intramuscular Given 03/16/17 0048)  haloperidol lactate (HALDOL) injection 5 mg (5 mg Intravenous Given 03/16/17 0113)     Initial Impression / Assessment and Plan / ED Course  I have reviewed the triage vital signs and the nursing notes.  Pertinent labs & imaging results that were available during my care of the patient were reviewed by me and considered in my medical decision making (see chart for details).    Labs:  CBG,  I stat chem 8, cbc, cmp  Imaging:  Consults:  Therapeutics:  Discharge Meds:   Assessment/Plan: 51 year old female who presents today  with nausea vomiting abdominal pain. Patient has a history of gastroparesis, reports this feels similar. Generalized abdominal tenderness, nonfocal. Dry heaving. Multiple attempts at IV  were unsuccessful.  IV established, labs are drawn. Patient started on DKA protocol including glucose stabilizing her due to hyperglycemia and potential DKA. Patient given Haldol she's had good results with this the past.  Patient's care To Oncoming Provider Pending Laboratory Analysis, Assessment and Disposition.      Final Clinical Impressions(s) / ED Diagnoses   Final diagnoses:  Gastroparesis  Hyperglycemia    New Prescriptions New Prescriptions   No medications on file     Francee Gentile 03/16/17 0121    Varney Biles, MD 03/16/17 [phone]

## 2017-03-15 NOTE — ED Notes (Signed)
Pt. CBG 471, RN,Cherrelle made aware.

## 2017-03-16 ENCOUNTER — Encounter (HOSPITAL_COMMUNITY): Payer: Self-pay | Admitting: Internal Medicine

## 2017-03-16 DIAGNOSIS — E1010 Type 1 diabetes mellitus with ketoacidosis without coma: Secondary | ICD-10-CM | POA: Diagnosis not present

## 2017-03-16 DIAGNOSIS — E785 Hyperlipidemia, unspecified: Secondary | ICD-10-CM | POA: Diagnosis present

## 2017-03-16 DIAGNOSIS — Z9889 Other specified postprocedural states: Secondary | ICD-10-CM | POA: Diagnosis not present

## 2017-03-16 DIAGNOSIS — D649 Anemia, unspecified: Secondary | ICD-10-CM | POA: Diagnosis not present

## 2017-03-16 DIAGNOSIS — Z79899 Other long term (current) drug therapy: Secondary | ICD-10-CM | POA: Diagnosis not present

## 2017-03-16 DIAGNOSIS — I10 Essential (primary) hypertension: Secondary | ICD-10-CM | POA: Diagnosis present

## 2017-03-16 DIAGNOSIS — E860 Dehydration: Secondary | ICD-10-CM | POA: Diagnosis present

## 2017-03-16 DIAGNOSIS — Z888 Allergy status to other drugs, medicaments and biological substances status: Secondary | ICD-10-CM | POA: Diagnosis not present

## 2017-03-16 DIAGNOSIS — R8290 Unspecified abnormal findings in urine: Secondary | ICD-10-CM | POA: Diagnosis present

## 2017-03-16 DIAGNOSIS — Z833 Family history of diabetes mellitus: Secondary | ICD-10-CM | POA: Diagnosis not present

## 2017-03-16 DIAGNOSIS — K3184 Gastroparesis: Secondary | ICD-10-CM | POA: Diagnosis not present

## 2017-03-16 DIAGNOSIS — R000 Tachycardia, unspecified: Secondary | ICD-10-CM | POA: Diagnosis present

## 2017-03-16 DIAGNOSIS — R739 Hyperglycemia, unspecified: Secondary | ICD-10-CM | POA: Diagnosis not present

## 2017-03-16 DIAGNOSIS — Z8249 Family history of ischemic heart disease and other diseases of the circulatory system: Secondary | ICD-10-CM | POA: Diagnosis not present

## 2017-03-16 DIAGNOSIS — E1043 Type 1 diabetes mellitus with diabetic autonomic (poly)neuropathy: Secondary | ICD-10-CM | POA: Diagnosis present

## 2017-03-16 DIAGNOSIS — D509 Iron deficiency anemia, unspecified: Secondary | ICD-10-CM | POA: Diagnosis present

## 2017-03-16 DIAGNOSIS — K219 Gastro-esophageal reflux disease without esophagitis: Secondary | ICD-10-CM | POA: Diagnosis present

## 2017-03-16 DIAGNOSIS — Z794 Long term (current) use of insulin: Secondary | ICD-10-CM | POA: Diagnosis not present

## 2017-03-16 DIAGNOSIS — Z9114 Patient's other noncompliance with medication regimen: Secondary | ICD-10-CM | POA: Diagnosis not present

## 2017-03-16 DIAGNOSIS — F419 Anxiety disorder, unspecified: Secondary | ICD-10-CM | POA: Diagnosis present

## 2017-03-16 DIAGNOSIS — Z880 Allergy status to penicillin: Secondary | ICD-10-CM | POA: Diagnosis not present

## 2017-03-16 LAB — BASIC METABOLIC PANEL
Anion gap: 22 — ABNORMAL HIGH (ref 5–15)
Anion gap: 9 (ref 5–15)
Anion gap: 9 (ref 5–15)
Anion gap: 9 (ref 5–15)
BUN: 19 mg/dL (ref 6–20)
BUN: 19 mg/dL (ref 6–20)
BUN: 19 mg/dL (ref 6–20)
BUN: 15 mg/dL (ref 6–20)
CALCIUM: 9 mg/dL (ref 8.9–10.3)
CALCIUM: 9.6 mg/dL (ref 8.9–10.3)
CHLORIDE: 108 mmol/L (ref 101–111)
CHLORIDE: 109 mmol/L (ref 101–111)
CHLORIDE: 95 mmol/L — AB (ref 101–111)
CO2: 23 mmol/L (ref 22–32)
CO2: 22 mmol/L (ref 22–32)
CO2: 22 mmol/L (ref 22–32)
CO2: 18 mmol/L — AB (ref 22–32)
CREATININE: 1.01 mg/dL — AB (ref 0.44–1.00)
CREATININE: 1.07 mg/dL — AB (ref 0.44–1.00)
CREATININE: 1.02 mg/dL — AB (ref 0.44–1.00)
CREATININE: 1.15 mg/dL — AB (ref 0.44–1.00)
Calcium: 8.9 mg/dL (ref 8.9–10.3)
Calcium: 8.8 mg/dL — ABNORMAL LOW (ref 8.9–10.3)
Chloride: 106 mmol/L (ref 101–111)
GFR calc Af Amer: >60 mL/min (ref >60)
GFR calc Af Amer: >60 mL/min (ref >60)
GFR calc non Af Amer: 55 mL/min — ABNORMAL LOW (ref >60)
GFR, EST NON AFRICAN AMERICAN: 60 mL/min — AB (ref >60)
GLUCOSE: 170 mg/dL — AB (ref 65–99)
GLUCOSE: 539 mg/dL — AB (ref 65–99)
Glucose, Bld: 159 mg/dL — ABNORMAL HIGH (ref 65–99)
Glucose, Bld: 85 mg/dL (ref 65–99)
Potassium: 4.7 mmol/L (ref 3.5–5.1)
Potassium: 4.3 mmol/L (ref 3.5–5.1)
Potassium: 4.8 mmol/L (ref 3.5–5.1)
Potassium: 4.6 mmol/L (ref 3.5–5.1)
SODIUM: 140 mmol/L (ref 135–145)
SODIUM: 140 mmol/L (ref 135–145)
SODIUM: 137 mmol/L (ref 135–145)
Sodium: 135 mmol/L (ref 135–145)

## 2017-03-16 LAB — I-STAT CHEM 8, ED
BUN: 15 mg/dL (ref 6–20)
Calcium, Ion: 1.09 mmol/L — ABNORMAL LOW (ref 1.15–1.40)
Chloride: 99 mmol/L — ABNORMAL LOW (ref 101–111)
Creatinine, Ser: 0.8 mg/dL (ref 0.44–1.00)
Glucose, Bld: 555 mg/dL (ref 65–99)
HCT: 34 % — ABNORMAL LOW (ref 36.0–46.0)
Hemoglobin: 11.6 g/dL — ABNORMAL LOW (ref 12.0–15.0)
Potassium: 4.7 mmol/L (ref 3.5–5.1)
Sodium: 134 mmol/L — ABNORMAL LOW (ref 135–145)
TCO2: 20 mmol/L — ABNORMAL LOW (ref 22–32)

## 2017-03-16 LAB — GLUCOSE, CAPILLARY
GLUCOSE-CAPILLARY: 217 mg/dL — AB (ref 65–99)
GLUCOSE-CAPILLARY: 120 mg/dL — AB (ref 65–99)
GLUCOSE-CAPILLARY: 75 mg/dL (ref 65–99)
GLUCOSE-CAPILLARY: 167 mg/dL — AB (ref 65–99)
Glucose-Capillary: 178 mg/dL — ABNORMAL HIGH (ref 65–99)
Glucose-Capillary: 169 mg/dL — ABNORMAL HIGH (ref 65–99)

## 2017-03-16 LAB — URINALYSIS, ROUTINE W REFLEX MICROSCOPIC
Bilirubin Urine: NEGATIVE
Hgb urine dipstick: NEGATIVE
KETONES UR: 80 mg/dL — AB
Leukocytes, UA: NEGATIVE
NITRITE: NEGATIVE
PH: 5 (ref 5.0–8.0)
PROTEIN: 30 mg/dL — AB
Specific Gravity, Urine: 1.021 (ref 1.005–1.030)

## 2017-03-16 LAB — BLOOD GAS, VENOUS
Acid-base deficit: 6.1 mmol/L — ABNORMAL HIGH (ref 0.0–2.0)
Bicarbonate: 18.5 mmol/L — ABNORMAL LOW (ref 20.0–28.0)
FIO2: 21
O2 Saturation: 84.1 %
Patient temperature: 98.6
pCO2, Ven: 35.5 mmHg — ABNORMAL LOW (ref 44.0–60.0)
pH, Ven: 7.337 (ref 7.250–7.430)
pO2, Ven: 56.8 mmHg — ABNORMAL HIGH (ref 32.0–45.0)

## 2017-03-16 LAB — CBC
HCT: 33 % — ABNORMAL LOW (ref 36.0–46.0)
Hemoglobin: 10 g/dL — ABNORMAL LOW (ref 12.0–15.0)
MCH: 22 pg — AB (ref 26.0–34.0)
MCHC: 30.3 g/dL (ref 30.0–36.0)
MCV: 72.7 fL — AB (ref 78.0–100.0)
Platelets: 520 10*3/uL — ABNORMAL HIGH (ref 150–400)
RBC: 4.54 MIL/uL (ref 3.87–5.11)
RDW: 16.7 % — AB (ref 11.5–15.5)
WBC: 17 10*3/uL — ABNORMAL HIGH (ref 4.0–10.5)

## 2017-03-16 LAB — CBG MONITORING, ED
Glucose-Capillary: 391 mg/dL — ABNORMAL HIGH (ref 65–99)
Glucose-Capillary: 342 mg/dL — ABNORMAL HIGH (ref 65–99)
Glucose-Capillary: 274 mg/dL — ABNORMAL HIGH (ref 65–99)

## 2017-03-16 LAB — RAPID URINE DRUG SCREEN, HOSP PERFORMED
AMPHETAMINES: NOT DETECTED
BENZODIAZEPINES: NOT DETECTED
Barbiturates: NOT DETECTED
COCAINE: NOT DETECTED
Opiates: NOT DETECTED
Tetrahydrocannabinol: NOT DETECTED

## 2017-03-16 LAB — TROPONIN I
Troponin I: <0.03 ng/mL (ref <0.03)
Troponin I: <0.03 ng/mL (ref <0.03)

## 2017-03-16 LAB — HCG, QUANTITATIVE, PREGNANCY: hCG, Beta Chain, Quant, S: 2 m[IU]/mL (ref <5)

## 2017-03-16 LAB — MRSA PCR SCREENING: MRSA by PCR: NEGATIVE

## 2017-03-16 MED ORDER — POLYETHYLENE GLYCOL 3350 17 G PO PACK
17.0000 g | PACK | Freq: Every day | ORAL | Status: DC | PRN
  Administered 2017-03-19 – 2017-03-20 (×2): 17 g via ORAL
  Filled 2017-03-16 (×2): qty 1

## 2017-03-16 MED ORDER — BUSPIRONE HCL 5 MG PO TABS
10.0000 mg | ORAL_TABLET | Freq: Two times a day (BID) | ORAL | Status: DC
  Administered 2017-03-16 – 2017-03-20 (×9): 10 mg via ORAL
  Filled 2017-03-16: qty 2
  Filled 2017-03-16: qty 1
  Filled 2017-03-16 (×4): qty 2
  Filled 2017-03-16: qty 1
  Filled 2017-03-16: qty 2
  Filled 2017-03-16: qty 1

## 2017-03-16 MED ORDER — HALOPERIDOL LACTATE 5 MG/ML IJ SOLN
5.0000 mg | Freq: Once | INTRAMUSCULAR | Status: AC
  Administered 2017-03-16: 5 mg via INTRAVENOUS
  Filled 2017-03-16: qty 1

## 2017-03-16 MED ORDER — SODIUM CHLORIDE 0.9 % IV SOLN
INTRAVENOUS | Status: DC
  Administered 2017-03-16: 05:00:00 via INTRAVENOUS

## 2017-03-16 MED ORDER — INSULIN ASPART 100 UNIT/ML ~~LOC~~ SOLN
0.0000 [IU] | Freq: Every day | SUBCUTANEOUS | Status: DC
  Administered 2017-03-18: 2 [IU] via SUBCUTANEOUS

## 2017-03-16 MED ORDER — VITAMIN B-12 1000 MCG PO TABS
1000.0000 ug | ORAL_TABLET | Freq: Every day | ORAL | Status: DC
  Administered 2017-03-16 – 2017-03-20 (×5): 1000 ug via ORAL
  Filled 2017-03-16 (×5): qty 1

## 2017-03-16 MED ORDER — FERROUS SULFATE 325 (65 FE) MG PO TABS
325.0000 mg | ORAL_TABLET | Freq: Every day | ORAL | Status: DC
  Administered 2017-03-16 – 2017-03-20 (×5): 325 mg via ORAL
  Filled 2017-03-16 (×5): qty 1

## 2017-03-16 MED ORDER — SODIUM CHLORIDE 0.9 % IV SOLN
INTRAVENOUS | Status: DC
  Administered 2017-03-16: 03:00:00 via INTRAVENOUS

## 2017-03-16 MED ORDER — ATORVASTATIN CALCIUM 40 MG PO TABS
40.0000 mg | ORAL_TABLET | Freq: Every day | ORAL | Status: DC
  Administered 2017-03-16 – 2017-03-20 (×5): 40 mg via ORAL
  Filled 2017-03-16 (×5): qty 1

## 2017-03-16 MED ORDER — LORAZEPAM 1 MG PO TABS
1.0000 mg | ORAL_TABLET | Freq: Three times a day (TID) | ORAL | Status: DC | PRN
Start: 2017-03-16 — End: 2017-03-20
  Administered 2017-03-16: 1 mg via ORAL
  Filled 2017-03-16: qty 1

## 2017-03-16 MED ORDER — FUROSEMIDE 40 MG PO TABS: 40.0000 mg | ORAL_TABLET | Freq: Every day | ORAL | Status: DC | PRN

## 2017-03-16 MED ORDER — SODIUM CHLORIDE 0.9 % IV BOLUS (SEPSIS)
1000.0000 mL | Freq: Once | INTRAVENOUS | Status: AC
  Administered 2017-03-16: 1000 mL via INTRAVENOUS

## 2017-03-16 MED ORDER — METOCLOPRAMIDE HCL 5 MG/5ML PO SOLN
10.0000 mg | Freq: Three times a day (TID) | ORAL | Status: DC
  Administered 2017-03-16 – 2017-03-18 (×10): 10 mg via ORAL
  Filled 2017-03-16 (×15): qty 10

## 2017-03-16 MED ORDER — FLUOXETINE HCL 20 MG PO CAPS
40.0000 mg | ORAL_CAPSULE | Freq: Every day | ORAL | Status: DC
  Administered 2017-03-16 – 2017-03-20 (×5): 40 mg via ORAL
  Filled 2017-03-16 (×5): qty 2

## 2017-03-16 MED ORDER — POTASSIUM CHLORIDE 10 MEQ/100ML IV SOLN
10.0000 meq | INTRAVENOUS | Status: AC
  Administered 2017-03-16 (×2): 10 meq via INTRAVENOUS
  Filled 2017-03-16: qty 100

## 2017-03-16 MED ORDER — INSULIN REGULAR HUMAN 100 UNIT/ML IJ SOLN
INTRAMUSCULAR | Status: DC
  Administered 2017-03-16: 5 [IU]/h via INTRAVENOUS
  Filled 2017-03-16: qty 1

## 2017-03-16 MED ORDER — INSULIN NPH (HUMAN) (ISOPHANE) 100 UNIT/ML ~~LOC~~ SUSP
10.0000 [IU] | Freq: Two times a day (BID) | SUBCUTANEOUS | Status: DC
  Administered 2017-03-16: 10 [IU] via SUBCUTANEOUS
  Filled 2017-03-16: qty 10

## 2017-03-16 MED ORDER — POTASSIUM CHLORIDE 10 MEQ/100ML IV SOLN
INTRAVENOUS | Status: AC
  Administered 2017-03-16: 10 meq via INTRAVENOUS
  Filled 2017-03-16: qty 100

## 2017-03-16 MED ORDER — ALBUTEROL SULFATE (2.5 MG/3ML) 0.083% IN NEBU: 3.0000 mL | INHALATION_SOLUTION | Freq: Four times a day (QID) | RESPIRATORY_TRACT | Status: DC | PRN

## 2017-03-16 MED ORDER — INSULIN DETEMIR 100 UNIT/ML ~~LOC~~ SOLN
10.0000 [IU] | Freq: Two times a day (BID) | SUBCUTANEOUS | Status: DC
  Administered 2017-03-16: 10 [IU] via SUBCUTANEOUS
  Filled 2017-03-16 (×2): qty 0.1

## 2017-03-16 MED ORDER — ONDANSETRON HCL 4 MG/2ML IJ SOLN
4.0000 mg | Freq: Once | INTRAMUSCULAR | Status: AC
  Administered 2017-03-16: 4 mg via INTRAMUSCULAR
  Filled 2017-03-16: qty 2

## 2017-03-16 MED ORDER — ONDANSETRON 4 MG PO TBDP: 4.0000 mg | ORAL_TABLET | Freq: Three times a day (TID) | ORAL | Status: DC | PRN

## 2017-03-16 MED ORDER — FAMOTIDINE 20 MG PO TABS
20.0000 mg | ORAL_TABLET | Freq: Two times a day (BID) | ORAL | Status: DC
  Administered 2017-03-16 – 2017-03-20 (×9): 20 mg via ORAL
  Filled 2017-03-16 (×9): qty 1

## 2017-03-16 MED ORDER — DEXTROSE-NACL 5-0.45 % IV SOLN: INTRAVENOUS | Status: DC

## 2017-03-16 MED ORDER — SODIUM CHLORIDE 0.9 % IV SOLN
INTRAVENOUS | Status: AC
  Administered 2017-03-16: 06:00:00 via INTRAVENOUS

## 2017-03-16 MED ORDER — INSULIN ASPART 100 UNIT/ML ~~LOC~~ SOLN
0.0000 [IU] | Freq: Three times a day (TID) | SUBCUTANEOUS | Status: DC
  Administered 2017-03-17: 2 [IU] via SUBCUTANEOUS
  Administered 2017-03-17 – 2017-03-18 (×3): 5 [IU] via SUBCUTANEOUS
  Administered 2017-03-18: 3 [IU] via SUBCUTANEOUS
  Administered 2017-03-18: 2 [IU] via SUBCUTANEOUS

## 2017-03-16 MED ORDER — POTASSIUM CHLORIDE 10 MEQ/100ML IV SOLN
10.0000 meq | INTRAVENOUS | Status: AC
  Administered 2017-03-16: 10 meq via INTRAVENOUS
  Filled 2017-03-16 (×2): qty 100

## 2017-03-16 MED ORDER — DEXTROSE-NACL 5-0.45 % IV SOLN
INTRAVENOUS | Status: DC
  Administered 2017-03-16: 07:00:00 via INTRAVENOUS

## 2017-03-16 MED ORDER — PREGABALIN 75 MG PO CAPS
150.0000 mg | ORAL_CAPSULE | Freq: Two times a day (BID) | ORAL | Status: DC
  Administered 2017-03-16 – 2017-03-20 (×9): 150 mg via ORAL
  Filled 2017-03-16 (×9): qty 2

## 2017-03-16 MED ORDER — SODIUM CHLORIDE 0.9 % IV SOLN
INTRAVENOUS | Status: DC
  Administered 2017-03-16: 6.3 [IU]/h via INTRAVENOUS

## 2017-03-16 MED ORDER — ENOXAPARIN SODIUM 40 MG/0.4ML ~~LOC~~ SOLN
40.0000 mg | SUBCUTANEOUS | Status: DC
  Administered 2017-03-16 – 2017-03-18 (×3): 40 mg via SUBCUTANEOUS
  Filled 2017-03-16 (×4): qty 0.4

## 2017-03-16 NOTE — ED Notes (Addendum)
WILL TRANSPORT PT TO 2W 1236-1. AAOX4. PT IN NO APPARENT DISTRESS OR PAIN. IVF OF INSULIN AND POTASSIUM INFUSING W/O PAIN OR SWELLING. THE OPPORTUNITY TO ASK QUESTIONS WAS PROVIDED.

## 2017-03-16 NOTE — Progress Notes (Signed)
PROGRESS NOTE    [name]  XIP:[id number] DOB: 05-Apr-1966 DOA: 03/15/2017 PCP: [name], MD    Brief Narrative:  51 y.o. female, w poorly controlled T1DM, Diabetic gastroparesis apparently n/v several times today.  DKA suspected due to nonadherence to medications.   Assessment & Plan:   Principal Problem:   DKA (diabetic ketoacidoses) ([place]) Active Problems:   Anemia   Essential hypertension   Gastroparesis   Tachycardia   DKA  T1DM  Gastroparesis: HbA1c pending, last A1c 10.  Resolved, possibly due to N/V from gastroparesis and then nonadherence? She did not take any insulin yesterday.   Resuming 10 units levemir BID (pt on 16 BID at home) Diabetic educator UA/Utox pending Reglan for gastroparesis  Tachycardia: likely due to dehydration. TSH, not obtained, but resolved.  trop negative  Anemia, Iron deficiency:  Ferritin 8/11 was 8 AM CBC Home iron  Anxiety Cont prozac, buspar, lorazepam  DVT prophylaxis: lovenox Code Status: full  Family Communication: none at bedside Disposition Plan: transfer to floor  Consultants:   none  Procedures: (Don't include imaging studies which can be auto populated. Include things that cannot be auto populated i.e. Echo, Carotid and venous dopplers, Foley, Bipap, HD, tubes/drains, wound vac, central lines etc)  none  Antimicrobials: (specify start and planned stop date. Auto populated tables are space occupying and do not give end dates)  none    Subjective: Doing ok.  Feeling better.  Objective: Vitals:   03/16/17 1140 03/16/17 1200 03/16/17 1249 03/16/17 1605  BP:  (!) 88/42 (!) 103/43   Pulse:  87 87   Resp:  12 13   Temp: 98.4 F (36.9 C)   97.6 F (36.4 C)  TempSrc: Oral   Oral  SpO2:  100% 100%   Weight:      Height:        Intake/Output Summary (Last 24 hours) at 03/16/17 1808 Last data filed at 03/16/17 1500  Gross per 24 hour  Intake          4675.13 ml  Output                0 ml  Net           4675.13 ml   Filed Weights   03/16/17 0100 03/16/17 0600  Weight: 59.9 kg (132 lb) 56.4 kg (124 lb 5.4 oz)    Examination:  General exam: Appears calm and comfortable  Respiratory system: Clear to auscultation. Respiratory effort normal. Cardiovascular system: S1 & S2 heard, RRR. No JVD, murmurs, rubs, gallops or clicks. No pedal edema. Gastrointestinal system: Abdomen is nondistended, soft and nontender. No organomegaly or masses felt. Normal bowel sounds heard. Central nervous system: Alert and oriented. No focal neurological deficits. Extremities: Symmetric 5 x 5 power. Skin: No rashes, lesions or ulcers Psychiatry: Judgement and insight appear normal. Mood & affect appropriate.     Data Reviewed: I have personally reviewed following labs and imaging studies  CBC:  Recent Labs Lab 03/16/17 0025 03/16/17 0041  WBC 17.0*  --   HGB 10.0* 11.6*  HCT 33.0* 34.0*  MCV 72.7*  --   PLT 520*  --    Basic Metabolic Panel:  Recent Labs Lab 03/16/17 0025 03/16/17 0041 03/16/17 0900 03/16/17 1508  NA 135 134* 140 140  K 4.7 4.7 4.3 4.8  CL 95* 99* 108 109  CO2 18*  --  23 22  GLUCOSE 539* 555* 159* 85  BUN 15 15 19 19   CREATININE  1.15* 0.80 1.07* 1.01*  CALCIUM 9.6  --  9.0 8.9   GFR: Estimated Creatinine Clearance: 53.3 mL/min (A) (by C-G formula based on SCr of 1.01 mg/dL (H)). Liver Function Tests: No results for input(s): AST, ALT, ALKPHOS, BILITOT, PROT, ALBUMIN in the last 168 hours. No results for input(s): LIPASE, AMYLASE in the last 168 hours. No results for input(s): AMMONIA in the last 168 hours. Coagulation Profile: No results for input(s): INR, PROTIME in the last 168 hours. Cardiac Enzymes:  Recent Labs Lab 03/16/17 0900 03/16/17 1508  TROPONINI <0.03 <0.03   BNP (last 3 results) No results for input(s): PROBNP in the last 8760 hours. HbA1C: No results for input(s): HGBA1C in the last 72 hours. CBG:  Recent Labs Lab 03/16/17 0643  03/16/17 0750 03/16/17 0859 03/16/17 1001 03/16/17 1604  GLUCAP 217* 178* 169* 120* 75   Lipid Profile: No results for input(s): CHOL, HDL, LDLCALC, TRIG, CHOLHDL, LDLDIRECT in the last 72 hours. Thyroid Function Tests: No results for input(s): TSH, T4TOTAL, FREET4, T3FREE, THYROIDAB in the last 72 hours. Anemia Panel: No results for input(s): VITAMINB12, FOLATE, FERRITIN, TIBC, IRON, RETICCTPCT in the last 72 hours. Sepsis Labs: No results for input(s): PROCALCITON, LATICACIDVEN in the last 168 hours.  Recent Results (from the past 240 hour(s))  MRSA PCR Screening     Status: None   Collection Time: 03/16/17  6:07 AM  Result Value Ref Range Status   MRSA by PCR NEGATIVE NEGATIVE Final    Comment:        The GeneXpert MRSA Assay (FDA approved for NASAL specimens only), is one component of a comprehensive MRSA colonization surveillance program. It is not intended to diagnose MRSA infection nor to guide or monitor treatment for MRSA infections.          Radiology Studies: No results found.      Scheduled Meds: . atorvastatin  40 mg Oral Daily  . busPIRone  10 mg Oral BID  . enoxaparin (LOVENOX) injection  40 mg Subcutaneous Q24H  . famotidine  20 mg Oral BID  . ferrous sulfate  325 mg Oral Q breakfast  . FLUoxetine  40 mg Oral Daily  . insulin aspart  0-5 Units Subcutaneous QHS  . insulin aspart  0-9 Units Subcutaneous TID WC  . insulin NPH Human  10 Units Subcutaneous BID AC & HS  . metoCLOPramide  10 mg Oral TID AC & HS  . pregabalin  150 mg Oral BID  . vitamin B-12  1,000 mcg Oral Daily   Continuous Infusions: . sodium chloride 150 mL/hr at 03/16/17 0500  . dextrose 5 % and 0.45% NaCl Stopped (03/16/17 1356)     LOS: 0 days    Time spent: [place], MD [facility] [phone]   If 7PM-7AM, please contact night-coverage [email or web address] Password [facility] 03/16/2017, 6:08 PM

## 2017-03-16 NOTE — H&P (Addendum)
TRH H&P   Patient Demographics:    [name], is a 51 y.o. female  MRN: [id number]   DOB - [birth year]  Admit Date - 03/15/2017  Outpatient Primary MD for the patient is [name], MD  Referring MD/NP/PA: Lajean Saver  Outpatient Specialists:   Patient coming from: home  Chief Complaint  Patient presents with  . Emesis      HPI:    [name]  is a 51 y.o. female, w poorly controlled Dm2, Diabetic gastroparesis apparently n/v several times today.  ? Noncompliance with medication. Pt denies fever, chills, cough, cp, palp, sob, n/v, diarrhea, dysuria, hematuria.  Pt presented to ED cause of n/v, and her sugar was high.    In ED  bs 555, Bun 15/creatinine 1.15  Hco3=15,   Wbc 17.0, Hgb 10.0, Plt 520.  Pt willl be admitted for DKA.      Review of systems:    In addition to the HPI above,  No Fever-chills, No Headache, No changes with Vision or hearing, No problems swallowing food or Liquids, No Chest pain, Cough or Shortness of Breath, No Abdominal pain,  Bowel movements are regular, No Blood in stool or Urine, No dysuria, No new skin rashes or bruises, No new joints pains-aches,  No new weakness, tingling, numbness in any extremity, No recent weight gain or loss, No polyuria, polydypsia or polyphagia, No significant Mental Stressors.  A full 10 point Review of Systems was done, except as stated above, all other Review of Systems were negative.   With Past History of the following :    Past Medical History:  Diagnosis Date  . Depression   . Diabetes mellitus without complication ([place])   . Essential hypertension   . Gastroparesis   . GERD (gastroesophageal reflux disease)   . HLD (hyperlipidemia)       Past Surgical History:  Procedure Laterality Date  . COLONOSCOPY  09/27/2014   at [facility]  . ESOPHAGOGASTRODUODENOSCOPY  09/27/2014   at [facility], Dr  Rolan Lipa. biospy neg for celiac, neg for H pylori.   . EYE SURGERY    . gailstones    . IR FLUORO GUIDE CV LINE RIGHT  02/01/2017  . IR FLUORO GUIDE CV LINE RIGHT  03/06/2017  . IR GENERIC HISTORICAL  01/24/2016   IR FLUORO GUIDE CV LINE RIGHT 01/24/2016 Darrell K Allred, PA-C [facility]  . IR GENERIC HISTORICAL  01/24/2016   IR US GUIDE VASC ACCESS RIGHT 01/24/2016 Darrell K Allred, PA-C [facility]  . IR US GUIDE VASC ACCESS RIGHT  02/01/2017  . IR US GUIDE VASC ACCESS RIGHT  03/06/2017  . POSTERIOR VITRECTOMY AND MEMBRANE PEEL-LEFT EYE  09/28/2002  . POSTERIOR VITRECTOMY AND MEMBRANE PEEL-RIGHT EYE  03/16/2002  . RETINAL DETACHMENT SURGERY        Social History:     Social History  Substance Use  Topics  . Smoking status: Never Smoker  . Smokeless tobacco: Never Used  . Alcohol use No     Lives - at home  Mobility - walks by self  Family History :     Family History  Problem Relation Age of Onset  . Cystic fibrosis Mother   . Hypertension Father   . Diabetes Brother   . Hypertension Maternal Grandmother       Home Medications:   Prior to Admission medications   Medication Sig Start Date End Date Taking? Authorizing Provider  albuterol (PROVENTIL HFA;VENTOLIN HFA) 108 (90 Base) MCG/ACT inhaler Inhale 2 puffs into the lungs every 6 (six) hours as needed for wheezing or shortness of breath. 10/20/15  Yes Amao, [name], MD  atorvastatin (LIPITOR) 40 MG tablet Take 1 tablet (40 mg total) by mouth daily. 10/30/16  Yes [name], MD  Blood Glucose Monitoring Suppl (ACCU-CHEK AVIVA) device Use as instructed 3 times daily before meals 10/30/16  Yes Amao, Enobong, MD  busPIRone (BUSPAR) 10 MG tablet Take 1 tablet (10 mg total) by mouth 2 (two) times daily. 10/30/16  Yes [name], MD  famotidine (PEPCID) 20 MG tablet Take 1 tablet (20 mg total) by mouth 2 (two) times daily. 09/29/16  Yes Patrecia Pour, Christean Grief, MD  ferrous sulfate 325 (65 FE) MG tablet Take 1 tablet (325 mg  total) by mouth daily with breakfast. 03/27/16  Yes Donne Hazel, MD  FLUoxetine (PROZAC) 20 MG tablet Take 2 tablets (40 mg total) by mouth daily. 10/30/16  Yes [name], MD  furosemide (LASIX) 40 MG tablet Take 40 mg by mouth daily as needed for fluid.    Yes [provider]  glucose blood (ACCU-CHEK AVIVA) test strip Use as instructed 3 times daily before meals 10/30/16  Yes Amao, Enobong, MD  insulin aspart (NOVOLOG) 100 UNIT/ML injection Inject 0-12 Units into the skin 3 (three) times daily with meals. Per sliding scale 10/30/16  Yes Amao, [name], MD  insulin detemir (LEVEMIR) 100 UNIT/ML injection Inject 0.16 mLs (16 Units total) into the skin 2 (two) times daily. 02/03/17  Yes Eugenie Filler, MD  Insulin Syringe-Needle U-100 (BD INSULIN SYRINGE ULTRAFINE) 31G X 15/64" 0.5 ML MISC 1 each by Does not apply route 4 (four) times daily as needed. 08/16/15  Yes [name], MD  Lancet Devices [facility]) lancets Use as instructed 3 times daily before meals 10/30/16  Yes Amao, Enobong, MD  lisinopril (PRINIVIL,ZESTRIL) 5 MG tablet Take 1 tablet (5 mg total) by mouth daily. 03/08/17  Yes Charlynne Cousins, MD  metoCLOPramide (REGLAN) 5 MG/5ML solution Take 10 mLs (10 mg total) by mouth 4 (four) times daily -  before meals and at bedtime. 02/06/17  Yes Charlynne Cousins, MD  ondansetron (ZOFRAN ODT) 4 MG disintegrating tablet Take 1 tablet (4 mg total) by mouth every 8 (eight) hours as needed for nausea or vomiting. 09/04/16  Yes [name], MD  polyethylene glycol (MIRALAX / GLYCOLAX) packet Take 17 g by mouth daily as needed for mild constipation. 09/04/16  Yes [name], MD  pregabalin (LYRICA) 150 MG capsule Take 1 capsule (150 mg total) by mouth 2 (two) times daily. 01/17/17  Yes Argentina Donovan, PA-C  vitamin B-12 (CYANOCOBALAMIN) 1000 MCG tablet Take 1 tablet (1,000 mcg total) by mouth daily. 02/03/17  Yes Eugenie Filler, MD  LORazepam (ATIVAN) 1 MG tablet Take 1  tablet (1 mg total) by mouth every 8 (eight) hours as needed for  anxiety. Patient not taking: Reported on 01/30/2017 01/09/17   Varney Biles, MD     Allergies:     Allergies  Allergen Reactions  . Anesthetics, Amide Nausea And Vomiting  . Penicillins Diarrhea and Nausea And Vomiting    Has patient had a PCN reaction causing immediate rash, facial/tongue/throat swelling, SOB or lightheadedness with hypotension: No Has patient had a PCN reaction causing severe rash involving mucus membranes or skin necrosis: No Has patient had a PCN reaction that required hospitalization No Has patient had a PCN reaction occurring within the last 10 years: Yes  If all of the above answers are "NO", then may proceed with Cephalosporin use.   . Buprenorphine Hcl Rash  . Encainide Nausea And Vomiting     Physical Exam:   Vitals  Blood pressure (!) 113/49, pulse (!) 103, resp. rate 15, height 5\' 2"  (1.575 m), weight 59.9 kg (132 lb), SpO2 99 %.   1. General  lying in bed in NAD,    2. Normal affect and insight, Not Suicidal or Homicidal, Awake Alert, Oriented X 3.  3. No F.N deficits, ALL C.Nerves Intact, Strength 5/5 all 4 extremities, Sensation intact all 4 extremities, Plantars down going.  4. Ears and Eyes appear Normal, Conjunctivae clear, PERRLA. Moist Oral Mucosa.  5. Supple Neck, No JVD, No cervical lymphadenopathy appriciated, No Carotid Bruits.  6. Symmetrical Chest wall movement, Good air movement bilaterally, CTAB.  7. RRR, No Gallops, Rubs or Murmurs, No Parasternal Heave.  8. Positive Bowel Sounds, Abdomen Soft, No tenderness, No organomegaly appriciated,No rebound -guarding or rigidity.  9.  No Cyanosis, Normal Skin Turgor, No Skin Rash or Bruise.  10. Good muscle tone,  joints appear normal , no effusions, Normal ROM.  11. No Palpable Lymph Nodes in Neck or Axillae     Data Review:    CBC  Recent Labs Lab 03/16/17 0025 03/16/17 0041  WBC 17.0*  --   HGB 10.0*  11.6*  HCT 33.0* 34.0*  PLT 520*  --   MCV 72.7*  --   MCH 22.0*  --   MCHC 30.3  --   RDW 16.7*  --    ------------------------------------------------------------------------------------------------------------------  Chemistries   Recent Labs Lab 03/16/17 0025 03/16/17 0041  NA 135 134*  K 4.7 4.7  CL 95* 99*  CO2 18*  --   GLUCOSE 539* 555*  BUN 15 15  CREATININE 1.15* 0.80  CALCIUM 9.6  --    ------------------------------------------------------------------------------------------------------------------ estimated creatinine clearance is 67.3 mL/min (by C-G formula based on SCr of 0.8 mg/dL). ------------------------------------------------------------------------------------------------------------------ No results for input(s): TSH, T4TOTAL, T3FREE, THYROIDAB in the last 72 hours.  Invalid input(s): FREET3  Coagulation profile No results for input(s): INR, PROTIME in the last 168 hours. ------------------------------------------------------------------------------------------------------------------- No results for input(s): DDIMER in the last 72 hours. -------------------------------------------------------------------------------------------------------------------  Cardiac Enzymes No results for input(s): CKMB, TROPONINI, MYOGLOBIN in the last 168 hours.  Invalid input(s): CK ------------------------------------------------------------------------------------------------------------------ No results found for: BNP   ---------------------------------------------------------------------------------------------------------------  Urinalysis    Component Value Date/Time   COLORURINE STRAW (A) 03/05/2017 [place] 03/05/2017 1835   LABSPEC 1.017 03/05/2017 [place] 5.0 03/05/2017 1835   GLUCOSEU >=500 (A) 03/05/2017 1835   HGBUR SMALL (A) 03/05/2017 [place] 03/05/2017 1835   BILIRUBINUR neg 01/17/2017 1533    KETONESUR 80 (A) 03/05/2017 1835   PROTEINUR 30 (A) 03/05/2017 1835   UROBILINOGEN 0.2 01/17/2017 1533   UROBILINOGEN 0.2 04/27/2015 1608  NITRITE NEGATIVE 03/05/2017 [place] 03/05/2017 1835    ----------------------------------------------------------------------------------------------------------------   Imaging Results:    No results found.   Assessment & Plan:    Principal Problem:   DKA (diabetic ketoacidoses) ([place]) Active Problems:   Anemia   Essential hypertension   Gastroparesis   Tachycardia     DKA Insulin iv NS iv Check bmp q4h x5  Diabetic gastoparesis reglan zofran  Tachycardia,  Tele Tsh Trop I q6h x3 Consider echo if persistent  Anemia Check cbc in am  Anxiety Cont prozac, buspar, lorazepam  DVT Prophylaxis Lovenox -   AM Labs Ordered, also please review Full Orders  Family Communication: Admission, patients condition and plan of care including tests being ordered have been discussed with the patient  who indicate understanding and agree with the plan and Code Status.  Code Status FULL CODE  Likely DC to  home  Condition GUARDED    Consults called: none  Admission status: inpatient  Time spent in minutes : 45 critical care   Jani Gravel M.D on 03/16/2017 at 4:45 AM  Between 7am to 7pm - Pager - [phone]. After 7pm go to [email or web address] - password [facility]  [facility] - Office  [phone]

## 2017-03-16 NOTE — ED Provider Notes (Signed)
Sign out from [employer], PA-C  Briefly, Ptient just discharged on 03/09/2017 for DKA. Patient began vomiting Friday morning and vomited all day. She has history of diabetes and gastroparesis. See previous provider's note for full H&P.  Haldol, Zofran, fluids given. Labs pending. Glucostabilizer initiated.  Plan: If DKA, admit.   Patient with DKA. Anion gap of 22. VBG shows pH 7.33., bicarbonate 18.5. CBG decreased to 274 in the ED. I consulted Dr. Maudie Mercury with [facility] who will admit the patient for further evaluation and treatment.    Frederica Kuster, PA-C 03/16/17 [id number]    Lajean Saver, MD 03/19/17 [phone]

## 2017-03-16 NOTE — ED Notes (Signed)
Pt. Attempted giving urine specimen but had bm.

## 2017-03-16 NOTE — ED Notes (Signed)
Pt. Attempted urine specimen, but had bm.

## 2017-03-16 NOTE — ED Notes (Signed)
FIRST ATTEMPT TO CALL REPORT TO 2W [id number].

## 2017-03-17 DIAGNOSIS — R739 Hyperglycemia, unspecified: Secondary | ICD-10-CM

## 2017-03-17 DIAGNOSIS — D509 Iron deficiency anemia, unspecified: Secondary | ICD-10-CM

## 2017-03-17 DIAGNOSIS — I10 Essential (primary) hypertension: Secondary | ICD-10-CM

## 2017-03-17 LAB — CBC
HCT: 28.1 % — ABNORMAL LOW (ref 36.0–46.0)
Hemoglobin: 8.8 g/dL — ABNORMAL LOW (ref 12.0–15.0)
MCH: 22.9 pg — ABNORMAL LOW (ref 26.0–34.0)
MCHC: 31.3 g/dL (ref 30.0–36.0)
MCV: 73.2 fL — ABNORMAL LOW (ref 78.0–100.0)
Platelets: 401 10*3/uL — ABNORMAL HIGH (ref 150–400)
RBC: 3.84 MIL/uL — ABNORMAL LOW (ref 3.87–5.11)
RDW: 17.4 % — ABNORMAL HIGH (ref 11.5–15.5)
WBC: 12.7 10*3/uL — ABNORMAL HIGH (ref 4.0–10.5)

## 2017-03-17 LAB — BASIC METABOLIC PANEL WITH GFR
Anion gap: 8 (ref 5–15)
BUN: 20 mg/dL (ref 6–20)
CO2: 23 mmol/L (ref 22–32)
Calcium: 8.7 mg/dL — ABNORMAL LOW (ref 8.9–10.3)
Chloride: 104 mmol/L (ref 101–111)
Creatinine, Ser: 0.93 mg/dL (ref 0.44–1.00)
GFR calc Af Amer: >60 mL/min
GFR calc non Af Amer: >60 mL/min
Glucose, Bld: 309 mg/dL — ABNORMAL HIGH (ref 65–99)
Potassium: 4.4 mmol/L (ref 3.5–5.1)
Sodium: 135 mmol/L (ref 135–145)

## 2017-03-17 LAB — GLUCOSE, CAPILLARY
GLUCOSE-CAPILLARY: 128 mg/dL — AB (ref 65–99)
GLUCOSE-CAPILLARY: 239 mg/dL — AB (ref 65–99)
Glucose-Capillary: 121 mg/dL — ABNORMAL HIGH (ref 65–99)
Glucose-Capillary: 288 mg/dL — ABNORMAL HIGH (ref 65–99)
Glucose-Capillary: 158 mg/dL — ABNORMAL HIGH (ref 65–99)
Glucose-Capillary: 72 mg/dL (ref 65–99)
Glucose-Capillary: 108 mg/dL — ABNORMAL HIGH (ref 65–99)

## 2017-03-17 LAB — HEMOGLOBIN A1C
Hgb A1c MFr Bld: 10.1 % — ABNORMAL HIGH (ref 4.8–5.6)
Mean Plasma Glucose: 243.17 mg/dL

## 2017-03-17 MED ORDER — INSULIN DETEMIR 100 UNIT/ML ~~LOC~~ SOLN
15.0000 [IU] | Freq: Two times a day (BID) | SUBCUTANEOUS | Status: DC
  Administered 2017-03-17: 15 [IU] via SUBCUTANEOUS
  Filled 2017-03-17 (×3): qty 0.15

## 2017-03-17 NOTE — Progress Notes (Signed)
PROGRESS NOTE    [name]  ATF:[id number] DOB: 1965-12-28 DOA: 03/15/2017 PCP: Yvette Morale, MD    Brief Narrative:  51 y.o. female, w poorly controlled T1DM, Diabetic gastroparesis apparently n/v several times today.  DKA suspected due to nonadherence to medications.   Assessment & Plan:   Principal Problem:   DKA (diabetic ketoacidoses) ([place]) Active Problems:   Anemia   Essential hypertension   Gastroparesis   Tachycardia   DKA  T1DM  Gastroparesis: HbA1c pending, last A1c 10.  Resolved, possibly due to N/V from gastroparesis and then nonadherence? She did not take any insulin on day of admission. AM bg elevated this morning Increase to 15 units levemir BID (pt states this is home dose)   Diabetic educator UA/Utox pending Reglan for gastroparesis  Abnormal UA: WBC's, but negative nitrite and LE.  Pt notes "maybe" UTI sx.  [ ]  ucx  Tachycardia: likely due to dehydration. TSH, not obtained, but resolved.  trop negative  Anemia, Iron deficiency:  Ferritin 8/11 was 8 H/H downtrended, appears closer to baseline, likely was hemoconcentrated on admission Daily CBC Home iron  Anxiety Cont prozac, buspar, lorazepam  DVT prophylaxis: lovenox Code Status: full  Family Communication: none at bedside Disposition Plan: transfer to floor  Consultants:   none  Procedures: (Don't include imaging studies which can be auto populated. Include things that cannot be auto populated i.e. Echo, Carotid and venous dopplers, Foley, Bipap, HD, tubes/drains, wound vac, central lines etc)  none  Antimicrobials: (specify start and planned stop date. Auto populated tables are space occupying and do not give end dates)  none    Subjective: Feeling better.  No complaints.   Objective: Vitals:   03/17/17 0400 03/17/17 0600 03/17/17 0700 03/17/17 0800  BP: 123/64 (!) 118/56 (!) 145/61   Pulse: 81 79 79   Resp: 16 10 12    Temp:    97.6 F (36.4 C)  TempSrc:    Oral    SpO2: 100% 100% 100%   Weight:      Height:        Intake/Output Summary (Last 24 hours) at 03/17/17 0816 Last data filed at 03/17/17 0000  Gross per 24 hour  Intake          2273.72 ml  Output              350 ml  Net          1923.72 ml   Filed Weights   03/16/17 0100 03/16/17 0600  Weight: 59.9 kg (132 lb) 56.4 kg (124 lb 5.4 oz)    Examination:  General: No acute distress. Cardiovascular: Heart sounds show [name] regular rate, and rhythm. No gallops or rubs. No murmurs. No JVD. Lungs: Clear to auscultation bilaterally with good air movement. No rales, rhonchi or wheezes. Abdomen: Soft, nontender, nondistended with normal active bowel sounds. No masses. No hepatosplenomegaly. Neurological: Alert and oriented 3. Moves all extremities 4 with equal strength. Cranial nerves II through XII grossly intact. Skin: Warm and dry. No rashes or lesions. Extremities: No clubbing or cyanosis. No edema. Pedal pulses 2+. Psychiatric: Mood and affect are normal. Insight and judgment are appropriate.    Data Reviewed: I have personally reviewed following labs and imaging studies  CBC:  Recent Labs Lab 03/16/17 0025 03/16/17 0041 03/17/17 0259  WBC 17.0*  --  12.7*  HGB 10.0* 11.6* 8.8*  HCT 33.0* 34.0* 28.1*  MCV 72.7*  --  73.2*  PLT 520*  --  401*  Basic Metabolic Panel:  Recent Labs Lab 03/16/17 0025 03/16/17 0041 03/16/17 0900 03/16/17 1508 03/16/17 2036 03/17/17 0259  NA 135 134* 140 140 137 135  K 4.7 4.7 4.3 4.8 4.6 4.4  CL 95* 99* 108 109 106 104  CO2 18*  --  23 22 22 23   GLUCOSE 539* 555* 159* 85 170* 309*  BUN 15 15 19 19 19 20   CREATININE 1.15* 0.80 1.07* 1.01* 1.02* 0.93  CALCIUM 9.6  --  9.0 8.9 8.8* 8.7*   GFR: Estimated Creatinine Clearance: 57.9 mL/min (by C-G formula based on SCr of 0.93 mg/dL). Liver Function Tests: No results for input(s): AST, ALT, ALKPHOS, BILITOT, PROT, ALBUMIN in the last 168 hours. No results for input(s): LIPASE, AMYLASE  in the last 168 hours. No results for input(s): AMMONIA in the last 168 hours. Coagulation Profile: No results for input(s): INR, PROTIME in the last 168 hours. Cardiac Enzymes:  Recent Labs Lab 03/16/17 0900 03/16/17 1508  TROPONINI <0.03 <0.03   BNP (last 3 results) No results for input(s): PROBNP in the last 8760 hours. HbA1C: No results for input(s): HGBA1C in the last 72 hours. CBG:  Recent Labs Lab 03/16/17 0750 03/16/17 0859 03/16/17 1001 03/16/17 1604 03/16/17 2152  GLUCAP 178* 169* 120* 75 167*   Lipid Profile: No results for input(s): CHOL, HDL, LDLCALC, TRIG, CHOLHDL, LDLDIRECT in the last 72 hours. Thyroid Function Tests: No results for input(s): TSH, T4TOTAL, FREET4, T3FREE, THYROIDAB in the last 72 hours. Anemia Panel: No results for input(s): VITAMINB12, FOLATE, FERRITIN, TIBC, IRON, RETICCTPCT in the last 72 hours. Sepsis Labs: No results for input(s): PROCALCITON, LATICACIDVEN in the last 168 hours.  Recent Results (from the past 240 hour(s))  MRSA PCR Screening     Status: None   Collection Time: 03/16/17  6:07 AM  Result Value Ref Range Status   MRSA by PCR NEGATIVE NEGATIVE Final    Comment:        The GeneXpert MRSA Assay (FDA approved for NASAL specimens only), is one component of [name] comprehensive MRSA colonization surveillance program. It is not intended to diagnose MRSA infection nor to guide or monitor treatment for MRSA infections.          Radiology Studies: No results found.      Scheduled Meds: . atorvastatin  40 mg Oral Daily  . busPIRone  10 mg Oral BID  . enoxaparin (LOVENOX) injection  40 mg Subcutaneous Q24H  . famotidine  20 mg Oral BID  . ferrous sulfate  325 mg Oral Q breakfast  . FLUoxetine  40 mg Oral Daily  . insulin aspart  0-5 Units Subcutaneous QHS  . insulin aspart  0-9 Units Subcutaneous TID WC  . insulin detemir  10 Units Subcutaneous BID  . metoCLOPramide  10 mg Oral TID AC & HS  . pregabalin   150 mg Oral BID  . vitamin B-12  1,000 mcg Oral Daily   Continuous Infusions: . sodium chloride 150 mL/hr at 03/16/17 0500  . dextrose 5 % and 0.45% NaCl Stopped (03/16/17 1356)     LOS: 1 day    Time spent: [place], MD [facility] [phone]   If 7PM-7AM, please contact night-coverage [email or web address] Password [email or web address] 03/17/2017, 8:16 AM

## 2017-03-18 LAB — CBC
HCT: 26.5 % — ABNORMAL LOW (ref 36.0–46.0)
HEMOGLOBIN: 8.3 g/dL — AB (ref 12.0–15.0)
MCH: 22.3 pg — AB (ref 26.0–34.0)
MCHC: 31.3 g/dL (ref 30.0–36.0)
MCV: 71.2 fL — ABNORMAL LOW (ref 78.0–100.0)
Platelets: 388 10*3/uL (ref 150–400)
RBC: 3.72 MIL/uL — ABNORMAL LOW (ref 3.87–5.11)
RDW: 17.1 % — AB (ref 11.5–15.5)
WBC: 7.7 10*3/uL (ref 4.0–10.5)

## 2017-03-18 LAB — BASIC METABOLIC PANEL
Anion gap: 9 (ref 5–15)
BUN: 18 mg/dL (ref 6–20)
CALCIUM: 8.8 mg/dL — AB (ref 8.9–10.3)
CO2: 22 mmol/L (ref 22–32)
CREATININE: 1.06 mg/dL — AB (ref 0.44–1.00)
Chloride: 103 mmol/L (ref 101–111)
GFR calc Af Amer: >60 mL/min (ref >60)
Glucose, Bld: 224 mg/dL — ABNORMAL HIGH (ref 65–99)
Potassium: 4.7 mmol/L (ref 3.5–5.1)
Sodium: 134 mmol/L — ABNORMAL LOW (ref 135–145)

## 2017-03-18 LAB — GLUCOSE, CAPILLARY
GLUCOSE-CAPILLARY: 281 mg/dL — AB (ref 65–99)
GLUCOSE-CAPILLARY: 204 mg/dL — AB (ref 65–99)
GLUCOSE-CAPILLARY: 171 mg/dL — AB (ref 65–99)
Glucose-Capillary: 196 mg/dL — ABNORMAL HIGH (ref 65–99)
Glucose-Capillary: 222 mg/dL — ABNORMAL HIGH (ref 65–99)

## 2017-03-18 LAB — URINE CULTURE

## 2017-03-18 MED ORDER — METOCLOPRAMIDE HCL 10 MG/10ML PO SOLN
10.0000 mg | Freq: Three times a day (TID) | ORAL | Status: DC
  Administered 2017-03-18 – 2017-03-20 (×8): 10 mg via ORAL
  Filled 2017-03-18 (×9): qty 10

## 2017-03-18 MED ORDER — INSULIN DETEMIR 100 UNIT/ML ~~LOC~~ SOLN
18.0000 [IU] | Freq: Two times a day (BID) | SUBCUTANEOUS | Status: DC
  Filled 2017-03-18: qty 0.18

## 2017-03-18 MED ORDER — INSULIN ASPART 100 UNIT/ML ~~LOC~~ SOLN
4.0000 [IU] | Freq: Three times a day (TID) | SUBCUTANEOUS | Status: DC
  Administered 2017-03-18 – 2017-03-20 (×5): 4 [IU] via SUBCUTANEOUS

## 2017-03-18 MED ORDER — INSULIN DETEMIR 100 UNIT/ML ~~LOC~~ SOLN
15.0000 [IU] | Freq: Two times a day (BID) | SUBCUTANEOUS | Status: DC
  Administered 2017-03-18 (×2): 15 [IU] via SUBCUTANEOUS
  Filled 2017-03-18 (×3): qty 0.15

## 2017-03-18 NOTE — Progress Notes (Signed)
Pt ambulating in the hall with tech, tolerated well.

## 2017-03-18 NOTE — Progress Notes (Signed)
PROGRESS NOTE    [name]  HUD:[id number] DOB: 1965-09-19 DOA: 03/15/2017 PCP: [name], MD    Brief Narrative:  51 y.o. female, w poorly controlled T1DM, Diabetic gastroparesis apparently n/v several times today.  DKA suspected due to nonadherence to medications.   Assessment & Plan:   Principal Problem:   DKA (diabetic ketoacidoses) ([place]) Active Problems:   Anemia   Essential hypertension   Gastroparesis   Tachycardia   DKA  T1DM  Gastroparesis: HbA1c pending, last A1c 10.  AM BG this morning elevated, did not receive nighttime levemir last night.  It was held due to BG of 72. Modified order, basal insulin should not be held.    Levemir 15 BID, will add on mealtime as well  DKA resolved, possibly due to N/V from gastroparesis and then nonadherence? She did not take any insulin on day of admission. AM bg elevated this morning Diabetic educator UA/Utox pending ->neg utox, ucx multiple species Reglan for gastroparesis  Abnormal UA: WBC's, but negative nitrite and LE.  Pt notes "maybe" UTI sx.  [ ]  ucx pending  Tachycardia: likely due to dehydration. TSH, not obtained, but resolved.  trop negative  Anemia, Iron deficiency:  Ferritin 8/11 was 8.  Downtrending to 8.3 today.  Looks to be close to baseline still (as low as 8.0 on 02/02/17).  No blood in stool.  H/H downtrended, appears closer to baseline, likely was hemoconcentrated on admission Daily CBC Home iron  Anxiety Cont prozac, buspar, lorazepam  DVT prophylaxis: lovenox Code Status: full  Family Communication: none at bedside Disposition Plan: transfer to floor  Consultants:   none  Procedures: (Don't include imaging studies which can be auto populated. Include things that cannot be auto populated i.e. Echo, Carotid and venous dopplers, Foley, Bipap, HD, tubes/drains, wound vac, central lines etc)  none  Antimicrobials: (specify start and planned stop date. Auto populated tables are space  occupying and do not give end dates)  none    Subjective: Feeling better.  No complaints. States BG held because of low bg last night.   Objective: Vitals:   03/17/17 1700 03/17/17 2004 03/18/17 0449 03/18/17 1327  BP: 113/68 132/71 130/61 124/71  Pulse: 83 84 86 86  Resp: 16 16 18 16   Temp: 98.2 F (36.8 C) 98.6 F (37 C) 98 F (36.7 C) 98.6 F (37 C)  TempSrc: Oral Oral Oral Oral  SpO2: 100% 100% 99% 100%  Weight: 61.7 kg (136 lb 0.4 oz)     Height: 5\' 2"  (1.575 m)       Intake/Output Summary (Last 24 hours) at 03/18/17 1334 Last data filed at 03/18/17 1300  Gross per 24 hour  Intake             1590 ml  Output                0 ml  Net             1590 ml   Filed Weights   03/16/17 0100 03/16/17 0600 03/17/17 1700  Weight: 59.9 kg (132 lb) 56.4 kg (124 lb 5.4 oz) 61.7 kg (136 lb 0.4 oz)    Examination:  General: No acute distress. Cardiovascular: Heart sounds show [name] regular rate, and rhythm. No gallops or rubs. No murmurs. No JVD. Lungs: Clear to auscultation bilaterally with good air movement. No rales, rhonchi or wheezes. Abdomen: Soft, nontender, nondistended with normal active bowel sounds. No masses. No hepatosplenomegaly. Neurological: Alert and oriented 3. Moves  all extremities 4 with equal strength. Cranial nerves II through XII grossly intact. Skin: Warm and dry. No rashes or lesions. Extremities: No clubbing or cyanosis. No edema. Pedal pulses 2+. Psychiatric: Mood and affect are normal. Insight and judgment are appropriate.    Data Reviewed: I have personally reviewed following labs and imaging studies  CBC:  Recent Labs Lab 03/16/17 0025 03/16/17 0041 03/17/17 0259 03/18/17 1213  WBC 17.0*  --  12.7* 7.7  HGB 10.0* 11.6* 8.8* 8.3*  HCT 33.0* 34.0* 28.1* 26.5*  MCV 72.7*  --  73.2* 71.2*  PLT 520*  --  401* 563   Basic Metabolic Panel:  Recent Labs Lab 03/16/17 0900 03/16/17 1508 03/16/17 2036 03/17/17 0259 03/18/17 1213  NA  140 140 137 135 134*  K 4.3 4.8 4.6 4.4 4.7  CL 108 109 106 104 103  CO2 23 22 22 23 22   GLUCOSE 159* 85 170* 309* 224*  BUN 19 19 19 20 18   CREATININE 1.07* 1.01* 1.02* 0.93 1.06*  CALCIUM 9.0 8.9 8.8* 8.7* 8.8*   GFR: Estimated Creatinine Clearance: 55.4 mL/min ([place]) (by C-G formula based on SCr of 1.06 mg/dL (H)). Liver Function Tests: No results for input(s): AST, ALT, ALKPHOS, BILITOT, PROT, ALBUMIN in the last 168 hours. No results for input(s): LIPASE, AMYLASE in the last 168 hours. No results for input(s): AMMONIA in the last 168 hours. Coagulation Profile: No results for input(s): INR, PROTIME in the last 168 hours. Cardiac Enzymes:  Recent Labs Lab 03/16/17 0900 03/16/17 1508  TROPONINI <0.03 <0.03   BNP (last 3 results) No results for input(s): PROBNP in the last 8760 hours. HbA1C:  Recent Labs  03/17/17 0749  HGBA1C 10.1*   CBG:  Recent Labs Lab 03/17/17 2138 03/17/17 2250 03/18/17 0221 03/18/17 0743 03/18/17 1229  GLUCAP 72 108* 196* 281* 204*   Lipid Profile: No results for input(s): CHOL, HDL, LDLCALC, TRIG, CHOLHDL, LDLDIRECT in the last 72 hours. Thyroid Function Tests: No results for input(s): TSH, T4TOTAL, FREET4, T3FREE, THYROIDAB in the last 72 hours. Anemia Panel: No results for input(s): VITAMINB12, FOLATE, FERRITIN, TIBC, IRON, RETICCTPCT in the last 72 hours. Sepsis Labs: No results for input(s): PROCALCITON, LATICACIDVEN in the last 168 hours.  Recent Results (from the past 240 hour(s))  MRSA PCR Screening     Status: None   Collection Time: 03/16/17  6:07 AM  Result Value Ref Range Status   MRSA by PCR NEGATIVE NEGATIVE Final    Comment:        The GeneXpert MRSA Assay (FDA approved for NASAL specimens only), is one component of Mieczyslaw Stamas comprehensive MRSA colonization surveillance program. It is not intended to diagnose MRSA infection nor to guide or monitor treatment for MRSA infections.          Radiology Studies: No  results found.      Scheduled Meds: . atorvastatin  40 mg Oral Daily  . busPIRone  10 mg Oral BID  . enoxaparin (LOVENOX) injection  40 mg Subcutaneous Q24H  . famotidine  20 mg Oral BID  . ferrous sulfate  325 mg Oral Q breakfast  . FLUoxetine  40 mg Oral Daily  . insulin aspart  0-5 Units Subcutaneous QHS  . insulin aspart  0-9 Units Subcutaneous TID WC  . insulin detemir  15 Units Subcutaneous BID  . metoCLOPramide  10 mg Oral TID AC & HS  . pregabalin  150 mg Oral BID  . vitamin B-12  1,000 mcg Oral Daily  Continuous Infusions:    LOS: 2 days    Time spent: [place], MD [facility] [phone]   If 7PM-7AM, please contact night-coverage [email or web address] Password [email or web address] 03/18/2017, 1:34 PM

## 2017-03-18 NOTE — Care Management Note (Signed)
Case Management Note  Patient Details  Name: [name] MRN: [id number] Date of Birth: [birth year]  Subjective/Objective:      51 yo admitted with DKA. Hx of poorly controlled Dm2, Diabetic gastroparesis               Action/Plan: From home with sister. Pt is active with the Deputy.  Expected Discharge Date:                  Expected Discharge Plan:  Home/Self Care  In-House Referral:     Discharge planning Services  CM Consult  Post Acute Care Choice:    Choice offered to:     DME Arranged:    DME Agency:     HH Arranged:    HH Agency:     Status of Service:  In process, will continue to follow  If discussed at Long Length of Stay Meetings, dates discussed:    Additional CommentsLynnell Catalan, RN 03/18/2017, 11:21 AM [phone]

## 2017-03-18 NOTE — Progress Notes (Signed)
Inpatient Diabetes Program Recommendations  AACE/ADA: New Consensus Statement on Inpatient Glycemic Control (2015)  Target Ranges:  Prepandial:   less than 140 mg/dL      Peak postprandial:   less than 180 mg/dL (1-2 hours)      Critically ill patients:  140 - 180 mg/dL   Lab Results  Component Value Date   GLUCAP 204 (H) 03/18/2017   HGBA1C 10.1 (H) 03/17/2017    Review of Glycemic Control  Levemir increased to 18 units bid. Post-prandial blood sugars elevated.  Inpatient Diabetes Program Recommendations:   Increase Novolog to 8 units tidwc for meal coverage insulin. Continue to stress importance of taking basal insulin even when feeling bad.  Continue to follow.  Thank you. Lorenda Peck, RD, LDN, CDE Inpatient Diabetes Coordinator [phone]

## 2017-03-18 NOTE — Hospital Discharge Follow-Up (Signed)
Met with the patient this afternoon. She is known to the [facility] and is again agreeable to scheduling a follow up appointment. An appointment was scheduled for 03/27/17 @ 0945 and the information was placed on the AVS.  The patient stated that she would contact medicaid to arrange for transportation to the clinic.  She also noted that she has an appointment with [facility] on 04/13/17. She said that she has not yet called Dr Darleene Cleaver for an appointment and requested that this CM call and make an appointment for her.   This CM reminded her that she has not been returning CM calls. She said that sometimes she just doesn't want to return the calls. Explained to her that we just want to make sure she is ok.  She said that she understood and would return calls going forward.   She spoke about the possibility of loosing her housing. She said that she is not sure how long her sister would be able to continue to support her and if she is not able to live with her sister, she will be homeless. She stated that she is still waiting for a decision about her disability. Reminded her that Christa See., LCSW is available at the [facility] for her if needed.   Marney Doctor, RN CM notified of the appointment and follow up with endocrine and psychiatry.

## 2017-03-18 NOTE — Progress Notes (Signed)
Inpatient Diabetes Program Recommendations  AACE/ADA: New Consensus Statement on Inpatient Glycemic Control (2015)  Target Ranges:  Prepandial:   less than 140 mg/dL      Peak postprandial:   less than 180 mg/dL (1-2 hours)      Critically ill patients:  140 - 180 mg/dL   Lab Results  Component Value Date   GLUCAP 281 (H) 03/18/2017   HGBA1C 10.1 (H) 03/17/2017    Review of Glycemic Control  Diabetes history: DM2 Outpatient Diabetes medications: Levemir 15 units bid, Novolog 0-12 units tidwc Current orders for Inpatient glycemic control: Levemir 15 units bid, Novolog 0-9 units tidwc and hs  Spoke with pt about skipping her Levemir at home. Pt states she doesn't take it when she's nauseated and vomiting. Explained importance of taking basal insulin even if she is nauseated. Encouraged her to monitor her blood sugars more frequently when gastroparesis is acting up. Instructed pt to f/u with her PCP who manages her diabetes. Pt voices understanding.  Needs meal coverage insulin.  Inpatient Diabetes Program Recommendations:    Add Novolog 4 units tidwc for meal coverage insulin.  Will continue to follow.   Thank you. Lorenda Peck, RD, LDN, CDE Inpatient Diabetes Coordinator [phone]

## 2017-03-19 ENCOUNTER — Telehealth: Payer: Self-pay | Admitting: Family Medicine

## 2017-03-19 LAB — BASIC METABOLIC PANEL
ANION GAP: 6 (ref 5–15)
BUN: 17 mg/dL (ref 6–20)
CALCIUM: 8.6 mg/dL — AB (ref 8.9–10.3)
CO2: 25 mmol/L (ref 22–32)
CREATININE: 0.95 mg/dL (ref 0.44–1.00)
Chloride: 104 mmol/L (ref 101–111)
GFR calc Af Amer: >60 mL/min (ref >60)
GLUCOSE: 315 mg/dL — AB (ref 65–99)
Potassium: 4.4 mmol/L (ref 3.5–5.1)
Sodium: 135 mmol/L (ref 135–145)

## 2017-03-19 LAB — CBC
HEMATOCRIT: 27.1 % — AB (ref 36.0–46.0)
HEMOGLOBIN: 8.8 g/dL — AB (ref 12.0–15.0)
MCH: 22.9 pg — AB (ref 26.0–34.0)
MCHC: 32.5 g/dL (ref 30.0–36.0)
MCV: 70.6 fL — AB (ref 78.0–100.0)
Platelets: 348 10*3/uL (ref 150–400)
RBC: 3.84 MIL/uL — ABNORMAL LOW (ref 3.87–5.11)
RDW: 17 % — AB (ref 11.5–15.5)
WBC: 8.8 10*3/uL (ref 4.0–10.5)

## 2017-03-19 LAB — GLUCOSE, CAPILLARY
GLUCOSE-CAPILLARY: 115 mg/dL — AB (ref 65–99)
Glucose-Capillary: 320 mg/dL — ABNORMAL HIGH (ref 65–99)
Glucose-Capillary: 91 mg/dL (ref 65–99)
Glucose-Capillary: 85 mg/dL (ref 65–99)

## 2017-03-19 MED ORDER — INSULIN DETEMIR 100 UNIT/ML ~~LOC~~ SOLN
18.0000 [IU] | Freq: Two times a day (BID) | SUBCUTANEOUS | Status: DC
  Administered 2017-03-19 – 2017-03-20 (×3): 18 [IU] via SUBCUTANEOUS
  Filled 2017-03-19 (×4): qty 0.18

## 2017-03-19 MED ORDER — INSULIN ASPART 100 UNIT/ML ~~LOC~~ SOLN
0.0000 [IU] | Freq: Three times a day (TID) | SUBCUTANEOUS | Status: DC
  Administered 2017-03-19: 7 [IU] via SUBCUTANEOUS
  Administered 2017-03-20: 2 [IU] via SUBCUTANEOUS

## 2017-03-19 MED ORDER — INSULIN ASPART 100 UNIT/ML ~~LOC~~ SOLN: 0.0000 [IU] | Freq: Every day | SUBCUTANEOUS | Status: DC

## 2017-03-19 NOTE — Telephone Encounter (Signed)
Call placed to patient [phone] regards to the appointment that we scheduled for her with Dr. Addison Naegeli. Appointment has been set up for Oct. 5 at 2:30pm. Pt needs to arrive 10 minutes earlier and bring both her insurance card and ID.   **If patient returns the call please inform her of the appointment day and time**

## 2017-03-19 NOTE — Progress Notes (Signed)
PROGRESS NOTE    [name]  VOH:[id number] DOB: 1965-11-19 DOA: 03/15/2017 PCP: [name], MD    Brief Narrative:  51 y.o. female, w poorly controlled T1DM, Diabetic gastroparesis apparently n/v several times today.  DKA suspected due to nonadherence to medications.   Assessment & Plan:   Principal Problem:   DKA (diabetic ketoacidoses) ([place]) Active Problems:   Anemia   Essential hypertension   Gastroparesis   Tachycardia   DKA  T1DM  Gastroparesis: HbA1c pending, last A1c 10.  AM BG this morning elevated, did not receive nighttime levemir on night of 9/23.  It was held due to BG of 72. Modified order, basal insulin should not be held.    AM BG this morning 320 Levemir 15 BID -> increase to 18 BID, with 4 units TID with meals along with sliding scale (at previous admission was on 25 BID at admission, but this was decreased to 16 BID b/c of hypoglycemia with 18 units BID). DKA resolved, possibly due to N/V from gastroparesis and then nonadherence? She did not take any insulin on day of admission. Diabetic educator UA/Utox pending ->neg utox, ucx multiple species Reglan for gastroparesis  Abnormal UA: WBC's, but negative nitrite and LE.  Pt notes "maybe" UTI sx.  [ ]  ucx multiple species, ctm  Tachycardia: likely due to dehydration. TSH, not obtained, but resolved.  trop negative  Anemia, Iron deficiency:  Ferritin 8/11 was 8. Looks to be close to baseline still (as low as 8.0 on 02/02/17).  No blood in stool.  H/H downtrended, appears closer to baseline, likely was hemoconcentrated on admission Daily CBC Home iron  Anxiety Cont prozac, buspar, lorazepam  DVT prophylaxis: lovenox Code Status: full  Family Communication: none at bedside Disposition Plan: transfer to floor  Consultants:   none  Procedures: (Don't include imaging studies which can be auto populated. Include things that cannot be auto populated i.e. Echo, Carotid and venous dopplers, Foley,  Bipap, HD, tubes/drains, wound vac, central lines etc)  none  Antimicrobials: (specify start and planned stop date. Auto populated tables are space occupying and do not give end dates)  none    Subjective: Doing ok.  No complaints.  Objective: Vitals:   03/18/17 1327 03/18/17 1918 03/18/17 2106 03/19/17 0450  BP: 124/71 118/61 129/63 125/68  Pulse: 86 93 90 92  Resp: 16 16 15 18   Temp: 98.6 F (37 C) 98.4 F (36.9 C) 98.8 F (37.1 C) 98.6 F (37 C)  TempSrc: Oral Oral Oral Oral  SpO2: 100% 100% 100% 98%  Weight:      Height:        Intake/Output Summary (Last 24 hours) at 03/19/17 0756 Last data filed at 03/18/17 2300  Gross per 24 hour  Intake             1320 ml  Output                0 ml  Net             1320 ml   Filed Weights   03/16/17 0100 03/16/17 0600 03/17/17 1700  Weight: 59.9 kg (132 lb) 56.4 kg (124 lb 5.4 oz) 61.7 kg (136 lb 0.4 oz)    Examination:  General: No acute distress. Cardiovascular: Heart sounds show a regular rate, and rhythm. No gallops or rubs. No murmurs. No JVD. Lungs: Clear to auscultation bilaterally with good air movement. No rales, rhonchi or wheezes. Abdomen: Soft, nontender, nondistended with normal active bowel  sounds. No masses. No hepatosplenomegaly. Neurological: Alert and oriented 3. Moves all extremities 4 with equal strength. Cranial nerves II through XII grossly intact. Skin: Warm and dry. No rashes or lesions. Extremities: No clubbing or cyanosis. No edema. Pedal pulses 2+. Psychiatric: Mood and affect are normal. Insight and judgment are appropriate.  Data Reviewed: I have personally reviewed following labs and imaging studies  CBC:  Recent Labs Lab 03/16/17 0025 03/16/17 0041 03/17/17 0259 03/18/17 1213  WBC 17.0*  --  12.7* 7.7  HGB 10.0* 11.6* 8.8* 8.3*  HCT 33.0* 34.0* 28.1* 26.5*  MCV 72.7*  --  73.2* 71.2*  PLT 520*  --  401* 440   Basic Metabolic Panel:  Recent Labs Lab 03/16/17 1508  03/16/17 2036 03/17/17 0259 03/18/17 1213 03/19/17 0415  NA 140 137 135 134* 135  K 4.8 4.6 4.4 4.7 4.4  CL 109 106 104 103 104  CO2 22 22 23 22 25   GLUCOSE 85 170* 309* 224* 315*  BUN 19 19 20 18 17   CREATININE 1.01* 1.02* 0.93 1.06* 0.95  CALCIUM 8.9 8.8* 8.7* 8.8* 8.6*   GFR: Estimated Creatinine Clearance: 61.9 mL/min (by C-G formula based on SCr of 0.95 mg/dL). Liver Function Tests: No results for input(s): AST, ALT, ALKPHOS, BILITOT, PROT, ALBUMIN in the last 168 hours. No results for input(s): LIPASE, AMYLASE in the last 168 hours. No results for input(s): AMMONIA in the last 168 hours. Coagulation Profile: No results for input(s): INR, PROTIME in the last 168 hours. Cardiac Enzymes:  Recent Labs Lab 03/16/17 0900 03/16/17 1508  TROPONINI <0.03 <0.03   BNP (last 3 results) No results for input(s): PROBNP in the last 8760 hours. HbA1C:  Recent Labs  03/17/17 0749  HGBA1C 10.1*   CBG:  Recent Labs Lab 03/18/17 0743 03/18/17 1229 03/18/17 1633 03/18/17 2220 03/19/17 0739  GLUCAP 281* 204* 171* 222* 320*   Lipid Profile: No results for input(s): CHOL, HDL, LDLCALC, TRIG, CHOLHDL, LDLDIRECT in the last 72 hours. Thyroid Function Tests: No results for input(s): TSH, T4TOTAL, FREET4, T3FREE, THYROIDAB in the last 72 hours. Anemia Panel: No results for input(s): VITAMINB12, FOLATE, FERRITIN, TIBC, IRON, RETICCTPCT in the last 72 hours. Sepsis Labs: No results for input(s): PROCALCITON, LATICACIDVEN in the last 168 hours.  Recent Results (from the past 240 hour(s))  MRSA PCR Screening     Status: None   Collection Time: 03/16/17  6:07 AM  Result Value Ref Range Status   MRSA by PCR NEGATIVE NEGATIVE Final    Comment:        The GeneXpert MRSA Assay (FDA approved for NASAL specimens only), is one component of a comprehensive MRSA colonization surveillance program. It is not intended to diagnose MRSA infection nor to guide or monitor treatment  for MRSA infections.   Culture, Urine     Status: Abnormal   Collection Time: 03/17/17  8:30 AM  Result Value Ref Range Status   Specimen Description URINE, RANDOM  Final   Special Requests NONE  Final   Culture MULTIPLE SPECIES PRESENT, SUGGEST RECOLLECTION (A)  Final   Report Status 03/18/2017 FINAL  Final         Radiology Studies: No results found.      Scheduled Meds: . atorvastatin  40 mg Oral Daily  . busPIRone  10 mg Oral BID  . enoxaparin (LOVENOX) injection  40 mg Subcutaneous Q24H  . famotidine  20 mg Oral BID  . ferrous sulfate  325 mg Oral Q breakfast  .  FLUoxetine  40 mg Oral Daily  . insulin aspart  0-5 Units Subcutaneous QHS  . insulin aspart  0-9 Units Subcutaneous TID WC  . insulin aspart  4 Units Subcutaneous TID WC  . insulin detemir  18 Units Subcutaneous BID  . metoCLOPramide  10 mg Oral TID AC & HS  . pregabalin  150 mg Oral BID  . vitamin B-12  1,000 mcg Oral Daily   Continuous Infusions:    LOS: 3 days    Time spent: [place], MD [facility] [phone]   If 7PM-7AM, please contact night-coverage [email or web address] Password [facility] 03/19/2017, 7:56 AM

## 2017-03-20 ENCOUNTER — Telehealth: Payer: Self-pay

## 2017-03-20 DIAGNOSIS — E1010 Type 1 diabetes mellitus with ketoacidosis without coma: Principal | ICD-10-CM

## 2017-03-20 LAB — GLUCOSE, CAPILLARY
GLUCOSE-CAPILLARY: 167 mg/dL — AB (ref 65–99)
GLUCOSE-CAPILLARY: 86 mg/dL (ref 65–99)
Glucose-Capillary: 56 mg/dL — ABNORMAL LOW (ref 65–99)
Glucose-Capillary: 60 mg/dL — ABNORMAL LOW (ref 65–99)
Glucose-Capillary: 275 mg/dL — ABNORMAL HIGH (ref 65–99)

## 2017-03-20 LAB — BASIC METABOLIC PANEL
ANION GAP: 8 (ref 5–15)
BUN: 15 mg/dL (ref 6–20)
CALCIUM: 8.9 mg/dL (ref 8.9–10.3)
CO2: 26 mmol/L (ref 22–32)
CREATININE: 1.07 mg/dL — AB (ref 0.44–1.00)
Chloride: 103 mmol/L (ref 101–111)
GFR, EST NON AFRICAN AMERICAN: 60 mL/min — AB (ref >60)
GLUCOSE: 188 mg/dL — AB (ref 65–99)
Potassium: 4.4 mmol/L (ref 3.5–5.1)
Sodium: 137 mmol/L (ref 135–145)

## 2017-03-20 MED ORDER — METOCLOPRAMIDE HCL 5 MG/5ML PO SOLN: 10.0000 mg | Freq: Three times a day (TID) | ORAL | 0 refills | Status: DC

## 2017-03-20 NOTE — Progress Notes (Signed)
CSW spoke with patient regarding "transportation concerns". Patient stated she has a cousin that would be able to pick her up once she gets off work after 5:30PM tonight. Patient stated she would call cousin once CSW stepped out of room. If patient is unable to get transportation with cousin CSW will provide bus pass to patient at time of discharge.   Kingsley Spittle, [facility] Emergency Room Clinical Social Worker [phone]

## 2017-03-20 NOTE — Telephone Encounter (Signed)
Call placed to Marcelino Scot, [place] to inform her that the patient is interested in [facility] dietician services and would also appreciate assistance with following up with her disability application.  Dorothy noted that she can assist with the dietitian but will not be able to follow up with the status of the disability application.

## 2017-03-20 NOTE — Discharge Instructions (Signed)
Gastroparesis °Gastroparesis, also called delayed gastric emptying, is a condition in which food takes longer than normal to empty from the stomach. The condition is usually long-lasting (chronic). °What are the causes? °This condition may be caused by: °· An endocrine disorder, such as hypothyroidism or diabetes. Diabetes is the most common cause of this condition. °· A nervous system disease, such as Parkinson disease or multiple sclerosis. °· Cancer, infection, or surgery of the stomach or vagus nerve. °· A connective tissue disorder, such as scleroderma. °· Certain medicines. ° °In most cases, the cause is not known. °What increases the risk? °This condition is more likely to develop in: °· People with certain disorders, including endocrine disorders, eating disorders, amyloidosis, and scleroderma. °· People with certain diseases, including Parkinson disease or multiple sclerosis. °· People with cancer or infection of the stomach or vagus nerve. °· People who have had surgery on the stomach or vagus nerve. °· People who take certain medicines. °· Women. ° °What are the signs or symptoms? °Symptoms of this condition include: °· An early feeling of fullness when eating. °· Nausea. °· Weight loss. °· Vomiting. °· Heartburn. °· Abdominal bloating. °· Inconsistent blood glucose levels. °· Lack of appetite. °· Acid from the stomach coming up into the esophagus (gastroesophageal reflux). °· Spasms of the stomach. ° °Symptoms may come and go. °How is this diagnosed? °This condition is diagnosed with tests, such as: °· Tests that check how long it takes food to move through the stomach and intestines. These tests include: °? Upper gastrointestinal (GI) series. In this test, X-rays of the intestines are taken after you drink a liquid. The liquid makes the intestines show up better on the X-rays. °? Gastric emptying scintigraphy. In this test, scans are taken after you eat food that contains a small amount of radioactive  material. °? Wireless capsule GI monitoring system. This test involves swallowing a capsule that records information about movement through the stomach. °· Gastric manometry. This test measures electrical and muscular activity in the stomach. It is done with a thin tube that is passed down the throat and into the stomach. °· Endoscopy. This test checks for abnormalities in the lining of the stomach. It is done with a long, thin tube that is passed down the throat and into the stomach. °· An ultrasound. This test can help rule out gallbladder disease or pancreatitis as a cause of your symptoms. It uses sound waves to take pictures of the inside of your body. ° °How is this treated? °There is no cure for gastroparesis. This condition may be managed with: °· Treatment of the underlying condition causing the gastroparesis. °· Lifestyle changes, including exercise and dietary changes. Dietary changes can include: °? Changes in what and when you eat. °? Eating smaller meals more often. °? Eating low-fat foods. °? Eating low-fiber forms of high-fiber foods, such as cooked vegetables instead of raw vegetables. °? Having liquid foods in place of solid foods. Liquid foods are easier to digest. °· Medicines. These may be given to control nausea and vomiting and to stimulate stomach muscles. °· Getting food through a feeding tube. This may be done in severe cases. °· A gastric neurostimulator. This is a device that is inserted into the body with surgery. It helps improve stomach emptying and control nausea and vomiting. ° °Follow these instructions at home: °· Follow your health care provider's instructions about exercise and diet. °· Take medicines only as directed by your health care provider. °Contact a   health care provider if: °· Your symptoms do not improve with treatment. °· You have new symptoms. °Get help right away if: °· You have severe abdominal pain that does not improve with treatment. °· You have nausea that does  not go away. °· You cannot keep fluids down. °This information is not intended to replace advice given to you by your health care provider. Make sure you discuss any questions you have with your health care provider. °Document Released: 06/11/2005 Document Revised: 11/17/2015 Document Reviewed: 06/07/2014 °Elsevier Interactive Patient Education © 2018 [employer] ° °

## 2017-03-21 ENCOUNTER — Telehealth: Payer: Self-pay

## 2017-03-21 NOTE — Telephone Encounter (Signed)
[facility] Post-discharge Follow-Up Phone Call:  Date of Discharge: 03/20/2017 Principal Discharge Diagnosis(es):  DKA, gastroparesis, anemia Post-discharge Communication: (Clearly document all attempts clearly and date contact made) call placed to the patient Call Completed: Yes                    With Whom: Patient Interpreter Needed: no     Please check all that apply:  X Patient is knowledgeable of his/her condition(s) and/or treatment. ? Patient is caring for self at home.  X  Patient is receiving assist at home from family and/or caregiver. Family and/or caregiver is knowledgeable of patient's condition(s) and/or treatment. - she lives with her sister but her sister works all day. ? Patient is receiving home health services. If so, name of agency.     Medication Reconciliation:  ? Medication list reviewed with patient. X  Patient obtained all discharge medications. If not, why? - She said that she has all of her medications. No changes made with her medication regime from prior to her hospitalization. She has a glucometer and stated that her blood sugar this morning was 177. She said that she feels " okay." no nausea/vomitng and she has been able to tolerate her meals, She said that she did not need to review her medication list and had no questions.    Activities of Daily Living:  X  Independent ? Needs assist (describe; ? home DME used) ? [facility] (describe, ? home DME used)   Community resources in place for patient:   X None  - [facility] - Marcelino Scot, RN/[facility] aware. ? Home Health/Home DME ? Assisted Living ? Support Group           Questions/Concerns discussed: Informed her that her appointment with Dr Darleene Cleaver is 03/29/17 @ 1430. This CM gave her the phone # and she was going to call the office to confirm address so that she could arrange transportation . She also confirmed her appointment with Dr Jarold Song 03/27/17 @ 0945 and said that she would arrange  medicaid transportation to the clinic. No questions /concerns reported at this time ,

## 2017-03-24 ENCOUNTER — Encounter (HOSPITAL_BASED_OUTPATIENT_CLINIC_OR_DEPARTMENT_OTHER): Payer: Self-pay | Admitting: Emergency Medicine

## 2017-03-24 ENCOUNTER — Inpatient Hospital Stay (HOSPITAL_BASED_OUTPATIENT_CLINIC_OR_DEPARTMENT_OTHER)
Admission: EM | Admit: 2017-03-24 | Discharge: 2017-04-03 | DRG: 638 | Disposition: A | Discharge status: Skilled Nursing Facility | Payer: Medicaid Other | Attending: Internal Medicine | Admitting: Internal Medicine

## 2017-03-24 DIAGNOSIS — Z880 Allergy status to penicillin: Secondary | ICD-10-CM

## 2017-03-24 DIAGNOSIS — F411 Generalized anxiety disorder: Secondary | ICD-10-CM | POA: Diagnosis present

## 2017-03-24 DIAGNOSIS — F329 Major depressive disorder, single episode, unspecified: Secondary | ICD-10-CM | POA: Diagnosis present

## 2017-03-24 DIAGNOSIS — Z794 Long term (current) use of insulin: Secondary | ICD-10-CM

## 2017-03-24 DIAGNOSIS — R339 Retention of urine, unspecified: Secondary | ICD-10-CM | POA: Diagnosis not present

## 2017-03-24 DIAGNOSIS — F419 Anxiety disorder, unspecified: Secondary | ICD-10-CM

## 2017-03-24 DIAGNOSIS — D509 Iron deficiency anemia, unspecified: Secondary | ICD-10-CM | POA: Diagnosis present

## 2017-03-24 DIAGNOSIS — E1043 Type 1 diabetes mellitus with diabetic autonomic (poly)neuropathy: Secondary | ICD-10-CM | POA: Diagnosis present

## 2017-03-24 DIAGNOSIS — G2401 Drug induced subacute dyskinesia: Secondary | ICD-10-CM

## 2017-03-24 DIAGNOSIS — K219 Gastro-esophageal reflux disease without esophagitis: Secondary | ICD-10-CM | POA: Diagnosis present

## 2017-03-24 DIAGNOSIS — Z885 Allergy status to narcotic agent status: Secondary | ICD-10-CM

## 2017-03-24 DIAGNOSIS — E785 Hyperlipidemia, unspecified: Secondary | ICD-10-CM | POA: Diagnosis present

## 2017-03-24 DIAGNOSIS — G9081 Serotonin syndrome: Secondary | ICD-10-CM

## 2017-03-24 DIAGNOSIS — F32A Depression, unspecified: Secondary | ICD-10-CM | POA: Diagnosis present

## 2017-03-24 DIAGNOSIS — R6510 Systemic inflammatory response syndrome (SIRS) of non-infectious origin without acute organ dysfunction: Secondary | ICD-10-CM | POA: Diagnosis present

## 2017-03-24 DIAGNOSIS — K3184 Gastroparesis: Secondary | ICD-10-CM

## 2017-03-24 DIAGNOSIS — F341 Dysthymic disorder: Secondary | ICD-10-CM | POA: Diagnosis present

## 2017-03-24 DIAGNOSIS — Z9119 Patient's noncompliance with other medical treatment and regimen: Secondary | ICD-10-CM

## 2017-03-24 DIAGNOSIS — Z884 Allergy status to anesthetic agent status: Secondary | ICD-10-CM

## 2017-03-24 DIAGNOSIS — E1110 Type 2 diabetes mellitus with ketoacidosis without coma: Secondary | ICD-10-CM

## 2017-03-24 DIAGNOSIS — Z8249 Family history of ischemic heart disease and other diseases of the circulatory system: Secondary | ICD-10-CM

## 2017-03-24 DIAGNOSIS — Z888 Allergy status to other drugs, medicaments and biological substances status: Secondary | ICD-10-CM

## 2017-03-24 DIAGNOSIS — Z833 Family history of diabetes mellitus: Secondary | ICD-10-CM

## 2017-03-24 DIAGNOSIS — N920 Excessive and frequent menstruation with regular cycle: Secondary | ICD-10-CM | POA: Diagnosis present

## 2017-03-24 DIAGNOSIS — Z79899 Other long term (current) drug therapy: Secondary | ICD-10-CM

## 2017-03-24 DIAGNOSIS — R1310 Dysphagia, unspecified: Secondary | ICD-10-CM | POA: Diagnosis present

## 2017-03-24 DIAGNOSIS — E1010 Type 1 diabetes mellitus with ketoacidosis without coma: Principal | ICD-10-CM

## 2017-03-24 DIAGNOSIS — G2402 Drug induced acute dystonia: Secondary | ICD-10-CM | POA: Diagnosis present

## 2017-03-24 DIAGNOSIS — T50905A Adverse effect of unspecified drugs, medicaments and biological substances, initial encounter: Secondary | ICD-10-CM | POA: Diagnosis present

## 2017-03-24 DIAGNOSIS — I10 Essential (primary) hypertension: Secondary | ICD-10-CM | POA: Diagnosis present

## 2017-03-24 DIAGNOSIS — Z23 Encounter for immunization: Secondary | ICD-10-CM

## 2017-03-24 DIAGNOSIS — E871 Hypo-osmolality and hyponatremia: Secondary | ICD-10-CM | POA: Diagnosis not present

## 2017-03-24 DIAGNOSIS — E1143 Type 2 diabetes mellitus with diabetic autonomic (poly)neuropathy: Secondary | ICD-10-CM

## 2017-03-24 DIAGNOSIS — G2579 Other drug induced movement disorders: Secondary | ICD-10-CM

## 2017-03-24 DIAGNOSIS — Z4659 Encounter for fitting and adjustment of other gastrointestinal appliance and device: Secondary | ICD-10-CM

## 2017-03-24 LAB — I-STAT CG4 LACTIC ACID, ED: Lactic Acid, Venous: 3.63 mmol/L (ref 0.5–1.9)

## 2017-03-24 MED ORDER — LORAZEPAM 2 MG/ML IJ SOLN
INTRAMUSCULAR | Status: AC
  Administered 2017-03-24: 0.5 mg via INTRAMUSCULAR
  Filled 2017-03-24: qty 1

## 2017-03-24 MED ORDER — BENZTROPINE MESYLATE 1 MG/ML IJ SOLN
INTRAMUSCULAR | Status: AC
  Administered 2017-03-24: 1 mg via INTRAVENOUS
  Filled 2017-03-24: qty 2

## 2017-03-24 MED ORDER — SODIUM CHLORIDE 0.9 % IV BOLUS (SEPSIS)
1000.0000 mL | Freq: Once | INTRAVENOUS | Status: AC
  Administered 2017-03-24: 1000 mL via INTRAVENOUS

## 2017-03-24 NOTE — ED Provider Notes (Signed)
[facility] DEPT [facility] Provider Note   CSN: [id number] Arrival date & time: 03/24/17  2201     History   Chief Complaint Chief Complaint  Patient presents with  . Emesis    HPI [name] is a 51 y.o. female.  The history is provided by the EMS personnel and the patient. The history is limited by the condition of the patient (unable to open mouth due to spasm, ams).  Emesis   The problem occurs continuously. The problem has not changed since onset.The emesis has an appearance of stomach contents. There has been no fever. Associated symptoms include arthralgias, myalgias and sweats. Pertinent negatives include no fever. Associated symptoms comments: Diaphoresis and extremity rigidity . Risk factors: recent discharge from [facility] for DKA.  She is on Buspar, reglan and prozac, unable to tell me other medications.    Past Medical History:  Diagnosis Date  . Depression   . Diabetes mellitus without complication ([place])   . Essential hypertension   . Gastroparesis   . GERD (gastroesophageal reflux disease)   . HLD (hyperlipidemia)     Patient Active Problem List   Diagnosis Date Noted  . Tachycardia 03/16/2017  . Diarrhea 03/05/2017  . Hypokalemia 02/06/2017  . Intractable nausea and vomiting 02/05/2017  . Gastroparesis 04/20/2016  . Type 1 diabetes mellitus with ketoacidosis without coma ([place])   . DKA (diabetic ketoacidoses) ([place]) 03/18/2016  . Noncompliance with treatment plan 03/13/2016  . Essential hypertension 01/24/2016  . Leukocytosis 01/24/2016  . Abnormal ECG 11/27/2015  . HLD (hyperlipidemia)   . GERD (gastroesophageal reflux disease)   . Metabolic acidosis, normal anion gap (NAG)   . AKI (acute kidney injury) ([place])   . Anemia, iron deficiency   . DKA, type 1, not at goal [facility])   . Hyponatremia 09/02/2015  . Vitamin B12 deficiency 08/16/2015  . Lactic acidosis 07/29/2015  . Acute pyelonephritis 07/29/2015  . Pyelonephritis   . Hypothermia   . Diabetic  gastroparesis ([place])   . Diabetic neuropathy, type I diabetes mellitus ([place]) 05/18/2015  . Anemia 05/18/2015  . Anxiety and depression 05/18/2015  . Nausea & vomiting   . Diabetic ketoacidosis without coma associated with type 1 diabetes mellitus ([place])   . High anion gap metabolic acidosis 67/89/3810    Past Surgical History:  Procedure Laterality Date  . COLONOSCOPY  09/27/2014   at [facility]  . ESOPHAGOGASTRODUODENOSCOPY  09/27/2014   at [facility], Dr Rolan Lipa. biospy neg for celiac, neg for H pylori.   . EYE SURGERY    . gailstones    . IR FLUORO GUIDE CV LINE RIGHT  02/01/2017  . IR FLUORO GUIDE CV LINE RIGHT  03/06/2017  . IR GENERIC HISTORICAL  01/24/2016   IR FLUORO GUIDE CV LINE RIGHT 01/24/2016 Darrell K Allred, PA-C [facility]-INTERV RAD  . IR GENERIC HISTORICAL  01/24/2016   IR US GUIDE VASC ACCESS RIGHT 01/24/2016 Darrell K Allred, PA-C [facility]INTER[facility] RAD  . IR US GUIDE VASC ACCESS RIGHT  02/01/2017  . IR US GUIDE VASC ACCESS RIGHT  03/06/2017  . POSTERIOR VITRECTOMY AND MEMBRANE PEEL-LEFT EYE  09/28/2002  . POSTERIOR VITRECTOMY AND MEMBRANE PEEL-RIGHT EYE  03/16/2002  . RETINAL DETACHMENT SURGERY      OB History    No data available       Home Medications    Prior to Admission medications   Medication Sig Start Date End Date Taking? Authorizing Provider  albuterol (PROVENTIL HFA;VENTOLIN HFA) 108 (90 Base) MCG/ACT inhaler Inhale 2  puffs into the lungs every 6 (six) hours as needed for wheezing or shortness of breath. 10/20/15   [name], MD  atorvastatin (LIPITOR) 40 MG tablet Take 1 tablet (40 mg total) by mouth daily. 10/30/16   [name], MD  Blood Glucose Monitoring Suppl (ACCU-CHEK AVIVA) device Use as instructed 3 times daily before meals 10/30/16   [name], MD  busPIRone (BUSPAR) 10 MG tablet Take 1 tablet (10 mg total) by mouth 2 (two) times daily. 10/30/16   [name], MD  famotidine (PEPCID) 20 MG tablet Take 1 tablet (20 mg total) by mouth 2 (two) times daily. 09/29/16    Doreatha Lew, MD  ferrous sulfate 325 (65 FE) MG tablet Take 1 tablet (325 mg total) by mouth daily with breakfast. 03/27/16   Donne Hazel, MD  FLUoxetine (PROZAC) 20 MG tablet Take 2 tablets (40 mg total) by mouth daily. 10/30/16   [name], MD  furosemide (LASIX) 40 MG tablet Take 40 mg by mouth daily as needed for fluid.     [provider]  glucose blood (ACCU-CHEK AVIVA) test strip Use as instructed 3 times daily before meals 10/30/16   [name], MD  insulin aspart (NOVOLOG) 100 UNIT/ML injection Inject 0-12 Units into the skin 3 (three) times daily with meals. Per sliding scale 10/30/16   [name], MD  insulin detemir (LEVEMIR) 100 UNIT/ML injection Inject 0.16 mLs (16 Units total) into the skin 2 (two) times daily. 02/03/17   Eugenie Filler, MD  Insulin Syringe-Needle U-100 (BD INSULIN SYRINGE ULTRAFINE) 31G X 15/64" 0.5 ML MISC 1 each by Does not apply route 4 (four) times daily as needed. 08/16/15   [name], MD  Lancet Devices [facility]) lancets Use as instructed 3 times daily before meals 10/30/16   [name], MD  lisinopril (PRINIVIL,ZESTRIL) 5 MG tablet Take 1 tablet (5 mg total) by mouth daily. 03/08/17   Charlynne Cousins, MD  LORazepam (ATIVAN) 1 MG tablet Take 1 tablet (1 mg total) by mouth every 8 (eight) hours as needed for anxiety. Patient not taking: Reported on 01/30/2017 01/09/17   Varney Biles, MD  metoCLOPramide (REGLAN) 5 MG/5ML solution Take 10 mLs (10 mg total) by mouth 4 (four) times daily -  before meals and at bedtime. 03/20/17   Lavina Hamman, MD  ondansetron (ZOFRAN ODT) 4 MG disintegrating tablet Take 1 tablet (4 mg total) by mouth every 8 (eight) hours as needed for nausea or vomiting. 09/04/16   [name], MD  polyethylene glycol (MIRALAX / GLYCOLAX) packet Take 17 g by mouth daily as needed for mild constipation. 09/04/16   [name], MD  pregabalin (LYRICA) 150 MG capsule Take 1 capsule (150 mg total) by  mouth 2 (two) times daily. 01/17/17   Argentina Donovan, PA-C  vitamin B-12 (CYANOCOBALAMIN) 1000 MCG tablet Take 1 tablet (1,000 mcg total) by mouth daily. 02/03/17   Eugenie Filler, MD    Family History Family History  Problem Relation Age of Onset  . Cystic fibrosis Mother   . Hypertension Father   . Diabetes Brother   . Hypertension Maternal Grandmother     Social History Social History  Substance Use Topics  . Smoking status: Never Smoker  . Smokeless tobacco: Never Used  . Alcohol use No     Allergies   Anesthetics, amide; Penicillins; Buprenorphine hcl; and Encainide   Review of Systems Review of Systems  Unable to perform ROS: Acuity of condition  Constitutional: Positive for diaphoresis. Negative for fever.  Gastrointestinal: Positive for vomiting.  Musculoskeletal: Positive for arthralgias and myalgias. Negative for joint swelling.     Physical Exam Updated Vital Signs BP (!) 147/93 (BP Location: Left Arm)   Pulse (!) 104   Temp 98.3 F (36.8 C) (Oral)   Resp 20   Ht _0  (1.575 m)   Wt 61.7 kg (136 lb)   LMP 03/24/2017   SpO2 98%   BMI 24.87 kg/m   Physical Exam  Constitutional: She appears distressed.  HENT:  Head: Normocephalic.  Right Ear: External ear normal.  Left Ear: External ear normal.  Nose: Nose normal.  Masseter spasm, lip smacking, rapid tongue movements  Eyes: Pupils are equal, round, and reactive to light. Conjunctivae are normal.  Neck: No JVD present. No tracheal deviation present.  Cardiovascular: Regular rhythm and intact distal pulses.  Tachycardia present.   Pulmonary/Chest: No stridor. Tachypnea noted. She has no wheezes. She has no rales.  Kussmaul respirations  Abdominal: Bowel sounds are normal. She exhibits no mass. There is no tenderness. There is no rebound and no guarding.  Musculoskeletal: She exhibits no edema.       Right hip: She exhibits decreased range of motion. She exhibits no crepitus.       Left hip:  She exhibits decreased range of motion. She exhibits no crepitus.       Right knee: She exhibits decreased range of motion.       Left knee: She exhibits decreased range of motion.       Right ankle: She exhibits decreased range of motion.       Left ankle: She exhibits decreased range of motion.  Neurological: She is alert. She displays abnormal reflex. She displays no seizure activity.  Clonus, 3 + reflexes with cross adductors Cogwheel rigidity in all 4 extremities BLE> BUE  Knees are flexes and face left patient is lying on back with back on the mattress from waist down body is rotated to the left  Skin: Capillary refill takes less than 2 seconds. She is diaphoretic.  Psychiatric:  unable  Nursing note and vitals reviewed.    ED Treatments / Results   Vitals:   03/25/17 0215 03/25/17 0230  BP: (!) 153/81 (!) 150/80  Pulse: 92 93  Resp: (!) 21 20  Temp:    SpO2: 98% 99%    Labs (all labs ordered are listed, but only abnormal results are displayed)  Results for orders placed or performed during the hospital encounter of 03/24/17  CBC with Differential/Platelet  Result Value Ref Range   WBC 6.7 4.0 - 10.5 K/uL   RBC 4.26 3.87 - 5.11 MIL/uL   Hemoglobin 9.5 (L) 12.0 - 15.0 g/dL   HCT 31.0 (L) 36.0 - 46.0 %   MCV 72.8 (L) 78.0 - 100.0 fL   MCH 22.3 (L) 26.0 - 34.0 pg   MCHC 30.6 30.0 - 36.0 g/dL   RDW 17.3 (H) 11.5 - 15.5 %   Platelets 467 (H) 150 - 400 K/uL   Neutrophils Relative % 79 %   Lymphocytes Relative 13 %   Monocytes Relative 6 %   Eosinophils Relative 1 %   Basophils Relative 1 %   Neutro Abs 5.2 1.7 - 7.7 K/uL   Lymphs Abs 0.9 0.7 - 4.0 K/uL   Monocytes Absolute 0.4 0.1 - 1.0 K/uL   Eosinophils Absolute 0.1 0.0 - 0.7 K/uL   Basophils Absolute 0.1 0.0 - 0.1 K/uL  RBC Morphology POLYCHROMASIA PRESENT   Comprehensive metabolic panel  Result Value Ref Range   Sodium 137 135 - 145 mmol/L   Potassium 4.1 3.5 - 5.1 mmol/L   Chloride 100 (L) 101 - 111  mmol/L   CO2 23 22 - 32 mmol/L   Glucose, Bld 294 (H) 65 - 99 mg/dL   BUN 15 6 - 20 mg/dL   Creatinine, Ser 0.95 0.44 - 1.00 mg/dL   Calcium 9.7 8.9 - 10.3 mg/dL   Total Protein 8.2 (H) 6.5 - 8.1 g/dL   Albumin 4.2 3.5 - 5.0 g/dL   AST 32 15 - 41 U/L   ALT 24 14 - 54 U/L   Alkaline Phosphatase 91 38 - 126 U/L   Total Bilirubin 1.0 0.3 - 1.2 mg/dL   GFR calc non Af Amer >60 >60 mL/min   GFR calc Af Amer >60 >60 mL/min   Anion gap 14 5 - 15  Pregnancy, urine  Result Value Ref Range   Preg Test, Ur NEGATIVE NEGATIVE  Urinalysis, Routine w reflex microscopic  Result Value Ref Range   Color, Urine YELLOW YELLOW   APPearance CLEAR CLEAR   Specific Gravity, Urine 1.020 1.005 - 1.030   pH 7.0 5.0 - 8.0   Glucose, UA >=500 (A) NEGATIVE mg/dL   Hgb urine dipstick TRACE (A) NEGATIVE   Bilirubin Urine NEGATIVE NEGATIVE   Ketones, ur 15 (A) NEGATIVE mg/dL   Protein, ur 100 (A) NEGATIVE mg/dL   Nitrite NEGATIVE NEGATIVE   Leukocytes, UA NEGATIVE NEGATIVE  CK  Result Value Ref Range   Total CK 82 38 - 234 U/L  Acetaminophen level  Result Value Ref Range   Acetaminophen (Tylenol), Serum <10 (L) 10 - 30 ug/mL  Salicylate level  Result Value Ref Range   Salicylate Lvl <4.9 2.8 - 30.0 mg/dL  Rapid urine drug screen (hospital performed)  Result Value Ref Range   Opiates NONE DETECTED NONE DETECTED   Cocaine NONE DETECTED NONE DETECTED   Benzodiazepines NONE DETECTED NONE DETECTED   Amphetamines NONE DETECTED NONE DETECTED   Tetrahydrocannabinol NONE DETECTED NONE DETECTED   Barbiturates NONE DETECTED NONE DETECTED  Troponin I  Result Value Ref Range   Troponin I <0.03 <0.03 ng/mL  Urinalysis, Microscopic (reflex)  Result Value Ref Range   RBC / HPF 0-5 0 - 5 RBC/hpf   WBC, UA 0-5 0 - 5 WBC/hpf   Bacteria, UA RARE (A) NONE SEEN   Squamous Epithelial / LPF 0-5 (A) NONE SEEN  POC CBG, ED  Result Value Ref Range   Glucose-Capillary 271 (H) 65 - 99 mg/dL  I-Stat CG4 Lactic Acid, ED   Result Value Ref Range   Lactic Acid, Venous 3.63 (HH) 0.5 - 1.9 mmol/L   Comment NOTIFIED PHYSICIAN    Ir Fluoro Guide Cv Line Right  Result Date: 03/06/2017 INDICATION: DIABETES, DKA, ACCESS FOR IV THERAPY AND HYDRATION EXAM: ULTRASOUND AND FLUOROSCOPIC GUIDED PICC LINE INSERTION MEDICATIONS: 1% PROCAINE LOCALLY CONTRAST:  None FLUOROSCOPY TIME:  Six seconds (less than 1 mGy) COMPLICATIONS: None immediate. TECHNIQUE: The procedure, risks, benefits, and alternatives were explained to the patient and informed written consent was obtained. A timeout was performed prior to the initiation of the procedure. The right upper extremity was prepped with chlorhexidine in a sterile fashion, and a sterile drape was applied covering the operative field. Maximum barrier sterile technique with sterile gowns and gloves were used for the procedure. A timeout was performed prior to the initiation of  the procedure. Local anesthesia was provided with 1% lidocaine. Under direct ultrasound guidance, the right brachial vein was accessed with a micropuncture kit after the overlying soft tissues were anesthetized with 1% lidocaine. An ultrasound image was saved for documentation purposes. A guidewire was advanced to the level of the superior caval-atrial junction for measurement purposes and the PICC line was cut to length. A peel-away sheath was placed and a 33 cm, 5 [place], dual lumen was inserted to level of the superior caval-atrial junction. A post procedure spot fluoroscopic was obtained. The catheter easily aspirated and flushed and was sutured in place. A dressing was placed. The patient tolerated the procedure well without immediate post procedural complication. FINDINGS: After catheter placement, the tip lies within the superior cavoatrial junction. The catheter aspirates and flushes normally and is ready for immediate use. IMPRESSION: Successful ultrasound and fluoroscopic guided placement of a right brachial vein  approach, 33 cm, 5 French, dual lumen PICC with tip at the superior caval-atrial junction. The PICC line is ready for immediate use. Electronically Signed   By: Jerilynn Mages.  Shick M.D.   On: 03/06/2017 12:24   Ir US Guide Vasc Access Right  Result Date: 03/06/2017 INDICATION: DIABETES, DKA, ACCESS FOR IV THERAPY AND HYDRATION EXAM: ULTRASOUND AND FLUOROSCOPIC GUIDED PICC LINE INSERTION MEDICATIONS: 1% PROCAINE LOCALLY CONTRAST:  None FLUOROSCOPY TIME:  Six seconds (less than 1 mGy) COMPLICATIONS: None immediate. TECHNIQUE: The procedure, risks, benefits, and alternatives were explained to the patient and informed written consent was obtained. A timeout was performed prior to the initiation of the procedure. The right upper extremity was prepped with chlorhexidine in a sterile fashion, and a sterile drape was applied covering the operative field. Maximum barrier sterile technique with sterile gowns and gloves were used for the procedure. A timeout was performed prior to the initiation of the procedure. Local anesthesia was provided with 1% lidocaine. Under direct ultrasound guidance, the right brachial vein was accessed with a micropuncture kit after the overlying soft tissues were anesthetized with 1% lidocaine. An ultrasound image was saved for documentation purposes. A guidewire was advanced to the level of the superior caval-atrial junction for measurement purposes and the PICC line was cut to length. A peel-away sheath was placed and a 33 cm, 5 [place], dual lumen was inserted to level of the superior caval-atrial junction. A post procedure spot fluoroscopic was obtained. The catheter easily aspirated and flushed and was sutured in place. A dressing was placed. The patient tolerated the procedure well without immediate post procedural complication. FINDINGS: After catheter placement, the tip lies within the superior cavoatrial junction. The catheter aspirates and flushes normally and is ready for immediate use.  IMPRESSION: Successful ultrasound and fluoroscopic guided placement of a right brachial vein approach, 33 cm, 5 French, dual lumen PICC with tip at the superior caval-atrial junction. The PICC line is ready for immediate use. Electronically Signed   By: Jerilynn Mages.  Shick M.D.   On: 03/06/2017 12:24   Dg Chest Port 1 View  Result Date: 03/07/2017 CLINICAL DATA:  Cough. EXAM: PORTABLE CHEST 1 VIEW COMPARISON:  01/31/2017. FINDINGS: Right PICC line in stable position. Heart size normal. Mild right base subsegmental atelectasis. No focal infiltrate. No pleural effusion or pneumothorax. Thoracic spine scoliosis. IMPRESSION: 1. Right PICC line stable position. 2.  Mild right base subsegmental atelectasis. Electronically Signed   By: Marcello Moores  Register   On: 03/07/2017 06:28    EKG  EKG Interpretation  Date/Time:  Sunday March 24 2017 23:22:54 EDT  Ventricular Rate:  106 PR Interval:    QRS Duration: 96 QT Interval:  353 QTC Calculation: 469 R Axis:   -27 Text Interpretation:  Sinus tachycardia RSR' in V1 or V2, probably normal variant Confirmed by Randal Buba, Gara Kincade ([id number]) on 03/24/2017 11:39:55 PM        Procedures Procedures (including critical care time)  Medications Ordered in ED  Results for orders placed or performed during the hospital encounter of 03/24/17  CBC with Differential/Platelet  Result Value Ref Range   WBC 6.7 4.0 - 10.5 K/uL   RBC 4.26 3.87 - 5.11 MIL/uL   Hemoglobin 9.5 (L) 12.0 - 15.0 g/dL   HCT 31.0 (L) 36.0 - 46.0 %   MCV 72.8 (L) 78.0 - 100.0 fL   MCH 22.3 (L) 26.0 - 34.0 pg   MCHC 30.6 30.0 - 36.0 g/dL   RDW 17.3 (H) 11.5 - 15.5 %   Platelets 467 (H) 150 - 400 K/uL   Neutrophils Relative % 79 %   Lymphocytes Relative 13 %   Monocytes Relative 6 %   Eosinophils Relative 1 %   Basophils Relative 1 %   Neutro Abs 5.2 1.7 - 7.7 K/uL   Lymphs Abs 0.9 0.7 - 4.0 K/uL   Monocytes Absolute 0.4 0.1 - 1.0 K/uL   Eosinophils Absolute 0.1 0.0 - 0.7 K/uL   Basophils  Absolute 0.1 0.0 - 0.1 K/uL   RBC Morphology POLYCHROMASIA PRESENT   Comprehensive metabolic panel  Result Value Ref Range   Sodium 137 135 - 145 mmol/L   Potassium 4.1 3.5 - 5.1 mmol/L   Chloride 100 (L) 101 - 111 mmol/L   CO2 23 22 - 32 mmol/L   Glucose, Bld 294 (H) 65 - 99 mg/dL   BUN 15 6 - 20 mg/dL   Creatinine, Ser 0.95 0.44 - 1.00 mg/dL   Calcium 9.7 8.9 - 10.3 mg/dL   Total Protein 8.2 (H) 6.5 - 8.1 g/dL   Albumin 4.2 3.5 - 5.0 g/dL   AST 32 15 - 41 U/L   ALT 24 14 - 54 U/L   Alkaline Phosphatase 91 38 - 126 U/L   Total Bilirubin 1.0 0.3 - 1.2 mg/dL   GFR calc non Af Amer >60 >60 mL/min   GFR calc Af Amer >60 >60 mL/min   Anion gap 14 5 - 15  Pregnancy, urine  Result Value Ref Range   Preg Test, Ur NEGATIVE NEGATIVE  Urinalysis, Routine w reflex microscopic  Result Value Ref Range   Color, Urine YELLOW YELLOW   APPearance CLEAR CLEAR   Specific Gravity, Urine 1.020 1.005 - 1.030   pH 7.0 5.0 - 8.0   Glucose, UA >=500 (A) NEGATIVE mg/dL   Hgb urine dipstick TRACE (A) NEGATIVE   Bilirubin Urine NEGATIVE NEGATIVE   Ketones, ur 15 (A) NEGATIVE mg/dL   Protein, ur 100 (A) NEGATIVE mg/dL   Nitrite NEGATIVE NEGATIVE   Leukocytes, UA NEGATIVE NEGATIVE  CK  Result Value Ref Range   Total CK 82 38 - 234 U/L  Acetaminophen level  Result Value Ref Range   Acetaminophen (Tylenol), Serum <10 (L) 10 - 30 ug/mL  Salicylate level  Result Value Ref Range   Salicylate Lvl <5.9 2.8 - 30.0 mg/dL  Rapid urine drug screen (hospital performed)  Result Value Ref Range   Opiates NONE DETECTED NONE DETECTED   Cocaine NONE DETECTED NONE DETECTED   Benzodiazepines NONE DETECTED NONE DETECTED   Amphetamines NONE DETECTED NONE DETECTED  Tetrahydrocannabinol NONE DETECTED NONE DETECTED   Barbiturates NONE DETECTED NONE DETECTED  Troponin I  Result Value Ref Range   Troponin I <0.03 <0.03 ng/mL  Urinalysis, Microscopic (reflex)  Result Value Ref Range   RBC / HPF 0-5 0 - 5 RBC/hpf     WBC, UA 0-5 0 - 5 WBC/hpf   Bacteria, UA RARE (A) NONE SEEN   Squamous Epithelial / LPF 0-5 (A) NONE SEEN  POC CBG, ED  Result Value Ref Range   Glucose-Capillary 271 (H) 65 - 99 mg/dL  I-Stat CG4 Lactic Acid, ED  Result Value Ref Range   Lactic Acid, Venous 3.63 (HH) 0.5 - 1.9 mmol/L   Comment NOTIFIED PHYSICIAN    Ir Fluoro Guide Cv Line Right  Result Date: 03/06/2017 INDICATION: DIABETES, DKA, ACCESS FOR IV THERAPY AND HYDRATION EXAM: ULTRASOUND AND FLUOROSCOPIC GUIDED PICC LINE INSERTION MEDICATIONS: 1% PROCAINE LOCALLY CONTRAST:  None FLUOROSCOPY TIME:  Six seconds (less than 1 mGy) COMPLICATIONS: None immediate. TECHNIQUE: The procedure, risks, benefits, and alternatives were explained to the patient and informed written consent was obtained. A timeout was performed prior to the initiation of the procedure. The right upper extremity was prepped with chlorhexidine in a sterile fashion, and a sterile drape was applied covering the operative field. Maximum barrier sterile technique with sterile gowns and gloves were used for the procedure. A timeout was performed prior to the initiation of the procedure. Local anesthesia was provided with 1% lidocaine. Under direct ultrasound guidance, the right brachial vein was accessed with a micropuncture kit after the overlying soft tissues were anesthetized with 1% lidocaine. An ultrasound image was saved for documentation purposes. A guidewire was advanced to the level of the superior caval-atrial junction for measurement purposes and the PICC line was cut to length. A peel-away sheath was placed and a 33 cm, 5 [place], dual lumen was inserted to level of the superior caval-atrial junction. A post procedure spot fluoroscopic was obtained. The catheter easily aspirated and flushed and was sutured in place. A dressing was placed. The patient tolerated the procedure well without immediate post procedural complication. FINDINGS: After catheter placement, the tip  lies within the superior cavoatrial junction. The catheter aspirates and flushes normally and is ready for immediate use. IMPRESSION: Successful ultrasound and fluoroscopic guided placement of a right brachial vein approach, 33 cm, 5 French, dual lumen PICC with tip at the superior caval-atrial junction. The PICC line is ready for immediate use. Electronically Signed   By: Jerilynn Mages.  Shick M.D.   On: 03/06/2017 12:24   Ir US Guide Vasc Access Right  Result Date: 03/06/2017 INDICATION: DIABETES, DKA, ACCESS FOR IV THERAPY AND HYDRATION EXAM: ULTRASOUND AND FLUOROSCOPIC GUIDED PICC LINE INSERTION MEDICATIONS: 1% PROCAINE LOCALLY CONTRAST:  None FLUOROSCOPY TIME:  Six seconds (less than 1 mGy) COMPLICATIONS: None immediate. TECHNIQUE: The procedure, risks, benefits, and alternatives were explained to the patient and informed written consent was obtained. A timeout was performed prior to the initiation of the procedure. The right upper extremity was prepped with chlorhexidine in a sterile fashion, and a sterile drape was applied covering the operative field. Maximum barrier sterile technique with sterile gowns and gloves were used for the procedure. A timeout was performed prior to the initiation of the procedure. Local anesthesia was provided with 1% lidocaine. Under direct ultrasound guidance, the right brachial vein was accessed with a micropuncture kit after the overlying soft tissues were anesthetized with 1% lidocaine. An ultrasound image was saved for documentation purposes. A  guidewire was advanced to the level of the superior caval-atrial junction for measurement purposes and the PICC line was cut to length. A peel-away sheath was placed and a 33 cm, 5 [place], dual lumen was inserted to level of the superior caval-atrial junction. A post procedure spot fluoroscopic was obtained. The catheter easily aspirated and flushed and was sutured in place. A dressing was placed. The patient tolerated the procedure well  without immediate post procedural complication. FINDINGS: After catheter placement, the tip lies within the superior cavoatrial junction. The catheter aspirates and flushes normally and is ready for immediate use. IMPRESSION: Successful ultrasound and fluoroscopic guided placement of a right brachial vein approach, 33 cm, 5 French, dual lumen PICC with tip at the superior caval-atrial junction. The PICC line is ready for immediate use. Electronically Signed   By: Jerilynn Mages.  Shick M.D.   On: 03/06/2017 12:24   Dg Chest Port 1 View  Result Date: 03/07/2017 CLINICAL DATA:  Cough. EXAM: PORTABLE CHEST 1 VIEW COMPARISON:  01/31/2017. FINDINGS: Right PICC line in stable position. Heart size normal. Mild right base subsegmental atelectasis. No focal infiltrate. No pleural effusion or pneumothorax. Thoracic spine scoliosis. IMPRESSION: 1. Right PICC line stable position. 2.  Mild right base subsegmental atelectasis. Electronically Signed   By: Marcello Moores  Register   On: 03/07/2017 06:28   MDM Reviewed: previous chart, nursing note and vitals (temp is not elevated but patient was unable to move mouth to make a good seal and on rectal temperature there was stool in the vault) Reviewed previous: labs Interpretation: labs and ECG (elevated lactic acid elevated glucose, normal anion gap, elevated platelets ) Total time providing critical care: 75-105 minutes. This excludes time spent performing separately reportable procedures and services. Consults: critical care (toxicology )  CRITICAL CARE Performed by: Carlisle Beers Total critical care time: 105 minutes Critical care time was exclusive of separately billable procedures and treating other patients. Critical care was necessary to treat or prevent imminent or life-threatening deterioration. Critical care was time spent personally by me on the following activities: development of treatment plan with patient and/or surrogate as well as nursing, discussions with  consultants, evaluation of patient's response to treatment, examination of patient, obtaining history from patient or surrogate, ordering and performing treatments and interventions, ordering and review of laboratory studies, ordering and review of radiographic studies, pulse oximetry and re-evaluation of patient's condition.  1245 am case d/w [employer].  Call returned by toxicologist on call, Dr. Zackery Barefoot.  Will need ongoing benzodiazepines, either ativan or versed 1-2 mg at a time.  Patient may fatigue or have significant respiratory suppression which will require intubation.  She will need full paralysis to overcome masseter spasm.  Cyproheptadine is a known reversal agent but takes hours to work and as it is not available at the ED that is being called from, continue benzodiazepine therapy and admit for continued benzo therapy and airway support and monitoring.  Intubate and paralyze if respiratory compromise.  Please call back with any additional questions.   EDP called back to [facility], they are not able to fax a sheet with recommendations.  Please call with any further questions or concerns.     Patient reexamined, she is somnolent but easily arousable. While she states she does not free improved, her vitals are markedly improved, she is no longer vomiting, she is able to completely open her jaw and has not having lip smacking, tongue movements.  Moreover, she is no longer diaphoretic.  I am able to range BUE.  BLE is not longer rotated to the left but still exhibit clonus, hyperreflexia and rigidity.  I will continue benzodiaz  Final Clinical Impressions(s) / ED Diagnoses  1. Serotonin Syndrome 2. Tardive dyskinesia  3. Gastroparesis secondary to DM with emesis   Will require admission to ICU for airway monitoring in the setting of ongoing benzodiazepine therapy for serotonin syndrome   New Prescriptions New Prescriptions   No medications on file       Becca Bayne, MD 03/25/17 [phone]

## 2017-03-24 NOTE — ED Triage Notes (Signed)
Patient in via [facility] - complaint of N/V - patient has chronic gastroparesis

## 2017-03-24 NOTE — ED Notes (Signed)
EDP into room, prior to RN assessment, see MD notes, pending orders.   

## 2017-03-24 NOTE — ED Provider Notes (Signed)
Procedure note: Ultrasound Guided Peripheral IV Ultrasound guided peripheral 1.88 inch angiocath IV placement performed by me. Indications: Nursing unable to place IV. Details: The antecubital fossa and upper arm were evaluated with a multifrequency linear probe. Patent brachial veins were noted. 1 attempt was made to cannulate a vein under realtime US guidance with successful cannulation of the vein and catheter placement. There is return of non-pulsatile dark red blood. The patient tolerated the procedure well without complications. Images archived electronically.  CPT codes: [phone] and [place], [place], DO 03/24/17 2342

## 2017-03-25 ENCOUNTER — Inpatient Hospital Stay (HOSPITAL_COMMUNITY): Payer: Medicaid Other

## 2017-03-25 ENCOUNTER — Encounter (HOSPITAL_BASED_OUTPATIENT_CLINIC_OR_DEPARTMENT_OTHER): Payer: Self-pay | Admitting: Emergency Medicine

## 2017-03-25 DIAGNOSIS — E1143 Type 2 diabetes mellitus with diabetic autonomic (poly)neuropathy: Secondary | ICD-10-CM | POA: Diagnosis not present

## 2017-03-25 DIAGNOSIS — Z833 Family history of diabetes mellitus: Secondary | ICD-10-CM | POA: Diagnosis not present

## 2017-03-25 DIAGNOSIS — F339 Major depressive disorder, recurrent, unspecified: Secondary | ICD-10-CM | POA: Diagnosis not present

## 2017-03-25 DIAGNOSIS — G2579 Other drug induced movement disorders: Secondary | ICD-10-CM | POA: Insufficient documentation

## 2017-03-25 DIAGNOSIS — T50905A Adverse effect of unspecified drugs, medicaments and biological substances, initial encounter: Secondary | ICD-10-CM | POA: Diagnosis present

## 2017-03-25 DIAGNOSIS — F411 Generalized anxiety disorder: Secondary | ICD-10-CM | POA: Diagnosis present

## 2017-03-25 DIAGNOSIS — F341 Dysthymic disorder: Secondary | ICD-10-CM | POA: Diagnosis present

## 2017-03-25 DIAGNOSIS — R339 Retention of urine, unspecified: Secondary | ICD-10-CM | POA: Diagnosis not present

## 2017-03-25 DIAGNOSIS — D508 Other iron deficiency anemias: Secondary | ICD-10-CM | POA: Diagnosis not present

## 2017-03-25 DIAGNOSIS — F419 Anxiety disorder, unspecified: Secondary | ICD-10-CM | POA: Diagnosis not present

## 2017-03-25 DIAGNOSIS — G2402 Drug induced acute dystonia: Secondary | ICD-10-CM | POA: Diagnosis present

## 2017-03-25 DIAGNOSIS — N920 Excessive and frequent menstruation with regular cycle: Secondary | ICD-10-CM | POA: Diagnosis present

## 2017-03-25 DIAGNOSIS — R110 Nausea: Secondary | ICD-10-CM | POA: Diagnosis not present

## 2017-03-25 DIAGNOSIS — K219 Gastro-esophageal reflux disease without esophagitis: Secondary | ICD-10-CM | POA: Diagnosis present

## 2017-03-25 DIAGNOSIS — Z885 Allergy status to narcotic agent status: Secondary | ICD-10-CM | POA: Diagnosis not present

## 2017-03-25 DIAGNOSIS — Z9119 Patient's noncompliance with other medical treatment and regimen: Secondary | ICD-10-CM | POA: Diagnosis not present

## 2017-03-25 DIAGNOSIS — E785 Hyperlipidemia, unspecified: Secondary | ICD-10-CM | POA: Diagnosis present

## 2017-03-25 DIAGNOSIS — E0810 Diabetes mellitus due to underlying condition with ketoacidosis without coma: Secondary | ICD-10-CM

## 2017-03-25 DIAGNOSIS — E1010 Type 1 diabetes mellitus with ketoacidosis without coma: Secondary | ICD-10-CM | POA: Diagnosis present

## 2017-03-25 DIAGNOSIS — G259 Extrapyramidal and movement disorder, unspecified: Secondary | ICD-10-CM | POA: Diagnosis not present

## 2017-03-25 DIAGNOSIS — R1310 Dysphagia, unspecified: Secondary | ICD-10-CM | POA: Diagnosis present

## 2017-03-25 DIAGNOSIS — E1043 Type 1 diabetes mellitus with diabetic autonomic (poly)neuropathy: Secondary | ICD-10-CM | POA: Diagnosis present

## 2017-03-25 DIAGNOSIS — G2401 Drug induced subacute dyskinesia: Secondary | ICD-10-CM | POA: Diagnosis present

## 2017-03-25 DIAGNOSIS — R6510 Systemic inflammatory response syndrome (SIRS) of non-infectious origin without acute organ dysfunction: Secondary | ICD-10-CM | POA: Diagnosis present

## 2017-03-25 DIAGNOSIS — Z884 Allergy status to anesthetic agent status: Secondary | ICD-10-CM | POA: Diagnosis not present

## 2017-03-25 DIAGNOSIS — F329 Major depressive disorder, single episode, unspecified: Secondary | ICD-10-CM | POA: Diagnosis present

## 2017-03-25 DIAGNOSIS — E871 Hypo-osmolality and hyponatremia: Secondary | ICD-10-CM | POA: Diagnosis not present

## 2017-03-25 DIAGNOSIS — I10 Essential (primary) hypertension: Secondary | ICD-10-CM | POA: Diagnosis present

## 2017-03-25 DIAGNOSIS — K3184 Gastroparesis: Secondary | ICD-10-CM | POA: Diagnosis present

## 2017-03-25 DIAGNOSIS — R112 Nausea with vomiting, unspecified: Secondary | ICD-10-CM | POA: Diagnosis present

## 2017-03-25 DIAGNOSIS — Z789 Other specified health status: Secondary | ICD-10-CM | POA: Diagnosis not present

## 2017-03-25 DIAGNOSIS — Z8249 Family history of ischemic heart disease and other diseases of the circulatory system: Secondary | ICD-10-CM | POA: Diagnosis not present

## 2017-03-25 DIAGNOSIS — Z23 Encounter for immunization: Secondary | ICD-10-CM | POA: Diagnosis not present

## 2017-03-25 DIAGNOSIS — D509 Iron deficiency anemia, unspecified: Secondary | ICD-10-CM | POA: Diagnosis present

## 2017-03-25 DIAGNOSIS — E1110 Type 2 diabetes mellitus with ketoacidosis without coma: Secondary | ICD-10-CM | POA: Diagnosis not present

## 2017-03-25 HISTORY — PX: IR US GUIDE VASC ACCESS RIGHT: IMG2390

## 2017-03-25 HISTORY — PX: IR FLUORO GUIDE CV LINE RIGHT: IMG2283

## 2017-03-25 LAB — GLUCOSE, CAPILLARY
GLUCOSE-CAPILLARY: 252 mg/dL — AB (ref 65–99)
GLUCOSE-CAPILLARY: 236 mg/dL — AB (ref 65–99)
GLUCOSE-CAPILLARY: 221 mg/dL — AB (ref 65–99)
GLUCOSE-CAPILLARY: 78 mg/dL (ref 65–99)
GLUCOSE-CAPILLARY: 85 mg/dL (ref 65–99)
GLUCOSE-CAPILLARY: 184 mg/dL — AB (ref 65–99)
GLUCOSE-CAPILLARY: 146 mg/dL — AB (ref 65–99)
Glucose-Capillary: 324 mg/dL — ABNORMAL HIGH (ref 65–99)
Glucose-Capillary: 241 mg/dL — ABNORMAL HIGH (ref 65–99)
Glucose-Capillary: 209 mg/dL — ABNORMAL HIGH (ref 65–99)
Glucose-Capillary: 146 mg/dL — ABNORMAL HIGH (ref 65–99)
Glucose-Capillary: 93 mg/dL (ref 65–99)
Glucose-Capillary: 130 mg/dL — ABNORMAL HIGH (ref 65–99)
Glucose-Capillary: 172 mg/dL — ABNORMAL HIGH (ref 65–99)
Glucose-Capillary: 178 mg/dL — ABNORMAL HIGH (ref 65–99)

## 2017-03-25 LAB — BASIC METABOLIC PANEL
Anion gap: 17 — ABNORMAL HIGH (ref 5–15)
Anion gap: 12 (ref 5–15)
Anion gap: 9 (ref 5–15)
Anion gap: 14 (ref 5–15)
BUN: 11 mg/dL (ref 6–20)
BUN: 9 mg/dL (ref 6–20)
BUN: 9 mg/dL (ref 6–20)
BUN: 7 mg/dL (ref 6–20)
CHLORIDE: 103 mmol/L (ref 101–111)
CHLORIDE: 105 mmol/L (ref 101–111)
CHLORIDE: 105 mmol/L (ref 101–111)
CHLORIDE: 104 mmol/L (ref 101–111)
CO2: 16 mmol/L — AB (ref 22–32)
CO2: 21 mmol/L — AB (ref 22–32)
CO2: 23 mmol/L (ref 22–32)
CO2: 18 mmol/L — AB (ref 22–32)
CREATININE: 0.93 mg/dL (ref 0.44–1.00)
CREATININE: 0.82 mg/dL (ref 0.44–1.00)
CREATININE: 0.8 mg/dL (ref 0.44–1.00)
CREATININE: 0.92 mg/dL (ref 0.44–1.00)
Calcium: 9.2 mg/dL (ref 8.9–10.3)
Calcium: 8.8 mg/dL — ABNORMAL LOW (ref 8.9–10.3)
Calcium: 8.9 mg/dL (ref 8.9–10.3)
Calcium: 8.8 mg/dL — ABNORMAL LOW (ref 8.9–10.3)
GFR calc Af Amer: >60 mL/min (ref >60)
GFR calc Af Amer: >60 mL/min (ref >60)
GFR calc Af Amer: >60 mL/min (ref >60)
GFR calc non Af Amer: >60 mL/min (ref >60)
GFR calc non Af Amer: >60 mL/min (ref >60)
GFR calc non Af Amer: >60 mL/min (ref >60)
GFR calc non Af Amer: >60 mL/min (ref >60)
GLUCOSE: 300 mg/dL — AB (ref 65–99)
Glucose, Bld: 192 mg/dL — ABNORMAL HIGH (ref 65–99)
Glucose, Bld: 95 mg/dL (ref 65–99)
Glucose, Bld: 179 mg/dL — ABNORMAL HIGH (ref 65–99)
POTASSIUM: 3.5 mmol/L (ref 3.5–5.1)
Potassium: 4.4 mmol/L (ref 3.5–5.1)
Potassium: 4.4 mmol/L (ref 3.5–5.1)
Potassium: 3.5 mmol/L (ref 3.5–5.1)
SODIUM: 136 mmol/L (ref 135–145)
Sodium: 138 mmol/L (ref 135–145)
Sodium: 137 mmol/L (ref 135–145)
Sodium: 136 mmol/L (ref 135–145)

## 2017-03-25 LAB — MRSA PCR SCREENING: MRSA BY PCR: NEGATIVE

## 2017-03-25 LAB — RAPID URINE DRUG SCREEN, HOSP PERFORMED
Amphetamines: NOT DETECTED
Barbiturates: NOT DETECTED
Benzodiazepines: NOT DETECTED
Cocaine: NOT DETECTED
Opiates: NOT DETECTED
Tetrahydrocannabinol: NOT DETECTED

## 2017-03-25 LAB — COMPREHENSIVE METABOLIC PANEL
ALK PHOS: 91 U/L (ref 38–126)
ALT: 24 U/L (ref 14–54)
ANION GAP: 14 (ref 5–15)
AST: 32 U/L (ref 15–41)
Albumin: 4.2 g/dL (ref 3.5–5.0)
BUN: 15 mg/dL (ref 6–20)
CALCIUM: 9.7 mg/dL (ref 8.9–10.3)
CO2: 23 mmol/L (ref 22–32)
CREATININE: 0.95 mg/dL (ref 0.44–1.00)
Chloride: 100 mmol/L — ABNORMAL LOW (ref 101–111)
Glucose, Bld: 294 mg/dL — ABNORMAL HIGH (ref 65–99)
Potassium: 4.1 mmol/L (ref 3.5–5.1)
SODIUM: 137 mmol/L (ref 135–145)
Total Bilirubin: 1 mg/dL (ref 0.3–1.2)
Total Protein: 8.2 g/dL — ABNORMAL HIGH (ref 6.5–8.1)

## 2017-03-25 LAB — CBC
HEMATOCRIT: 35.6 % — AB (ref 36.0–46.0)
HEMOGLOBIN: 10.8 g/dL — AB (ref 12.0–15.0)
MCH: 22.3 pg — AB (ref 26.0–34.0)
MCHC: 30.3 g/dL (ref 30.0–36.0)
MCV: 73.4 fL — ABNORMAL LOW (ref 78.0–100.0)
Platelets: 375 10*3/uL (ref 150–400)
RBC: 4.85 MIL/uL (ref 3.87–5.11)
RDW: 17.9 % — ABNORMAL HIGH (ref 11.5–15.5)
WBC: 10.3 10*3/uL (ref 4.0–10.5)

## 2017-03-25 LAB — PREGNANCY, URINE: PREG TEST UR: NEGATIVE

## 2017-03-25 LAB — CBC WITH DIFFERENTIAL/PLATELET
BASOS PCT: 1 %
Basophils Absolute: 0.1 10*3/uL (ref 0.0–0.1)
EOS ABS: 0.1 10*3/uL (ref 0.0–0.7)
Eosinophils Relative: 1 %
HCT: 31 % — ABNORMAL LOW (ref 36.0–46.0)
Hemoglobin: 9.5 g/dL — ABNORMAL LOW (ref 12.0–15.0)
LYMPHS PCT: 13 %
Lymphs Abs: 0.9 10*3/uL (ref 0.7–4.0)
MCH: 22.3 pg — AB (ref 26.0–34.0)
MCHC: 30.6 g/dL (ref 30.0–36.0)
MCV: 72.8 fL — ABNORMAL LOW (ref 78.0–100.0)
Monocytes Absolute: 0.4 10*3/uL (ref 0.1–1.0)
Monocytes Relative: 6 %
NEUTROS ABS: 5.2 10*3/uL (ref 1.7–7.7)
Neutrophils Relative %: 79 %
PLATELETS: 467 10*3/uL — AB (ref 150–400)
RBC: 4.26 MIL/uL (ref 3.87–5.11)
RDW: 17.3 % — ABNORMAL HIGH (ref 11.5–15.5)
WBC: 6.7 10*3/uL (ref 4.0–10.5)

## 2017-03-25 LAB — URINALYSIS, ROUTINE W REFLEX MICROSCOPIC
Bilirubin Urine: NEGATIVE
Glucose, UA: >=500 mg/dL — AB
Ketones, ur: 15 mg/dL — AB
Leukocytes, UA: NEGATIVE
NITRITE: NEGATIVE
PROTEIN: 100 mg/dL — AB
SPECIFIC GRAVITY, URINE: 1.02 (ref 1.005–1.030)
pH: 7 (ref 5.0–8.0)

## 2017-03-25 LAB — URINALYSIS, MICROSCOPIC (REFLEX)

## 2017-03-25 LAB — CK: CK TOTAL: 82 U/L (ref 38–234)

## 2017-03-25 LAB — POCT I-STAT 3, VENOUS BLOOD GAS (G3P V)
BICARBONATE: 26.4 mmol/L (ref 20.0–28.0)
O2 Saturation: 24 %
PO2 VEN: 18 mmHg — AB (ref 32.0–45.0)
TCO2: 28 mmol/L (ref 22–32)
pCO2, Ven: 47.9 mmHg (ref 44.0–60.0)
pH, Ven: 7.347 (ref 7.250–7.430)

## 2017-03-25 LAB — TSH: TSH: 0.632 u[IU]/mL (ref 0.350–4.500)

## 2017-03-25 LAB — TROPONIN I: Troponin I: <0.03 ng/mL (ref <0.03)

## 2017-03-25 LAB — CBG MONITORING, ED: Glucose-Capillary: 271 mg/dL — ABNORMAL HIGH (ref 65–99)

## 2017-03-25 LAB — PROCALCITONIN: PROCALCITONIN: 0.1 ng/mL

## 2017-03-25 LAB — ACETAMINOPHEN LEVEL

## 2017-03-25 LAB — CORTISOL: Cortisol, Plasma: 25.3 ug/dL

## 2017-03-25 LAB — LACTIC ACID, PLASMA
LACTIC ACID, VENOUS: 2 mmol/L — AB (ref 0.5–1.9)
Lactic Acid, Venous: 2.1 mmol/L (ref 0.5–1.9)

## 2017-03-25 LAB — SALICYLATE LEVEL

## 2017-03-25 IMAGING — XA IR FLUORO GUIDE CV LINE*R*
1 series · 1 of 1 positions shown · IV contrast (agent unspecified)
Comparison: none

INDICATION: Diabetes, DKA, access requested for IV therapy and hydration. Poor
venous access.

EXAM:
ULTRASOUND AND FLUOROSCOPIC GUIDED PICC LINE INSERTION
MEDICATIONS:
1% lidocaine
CONTRAST:  None
FLUOROSCOPY TIME:  Thirty seconds (5 mGy)
COMPLICATIONS:
None immediate.
TECHNIQUE: The procedure, risks, benefits, and alternatives were explained to
the patient and informed written consent was obtained. A timeout was
performed prior to the initiation of the procedure.

[Series 1: fl angio · 1 of 1 slices shown]
[im 1/1]
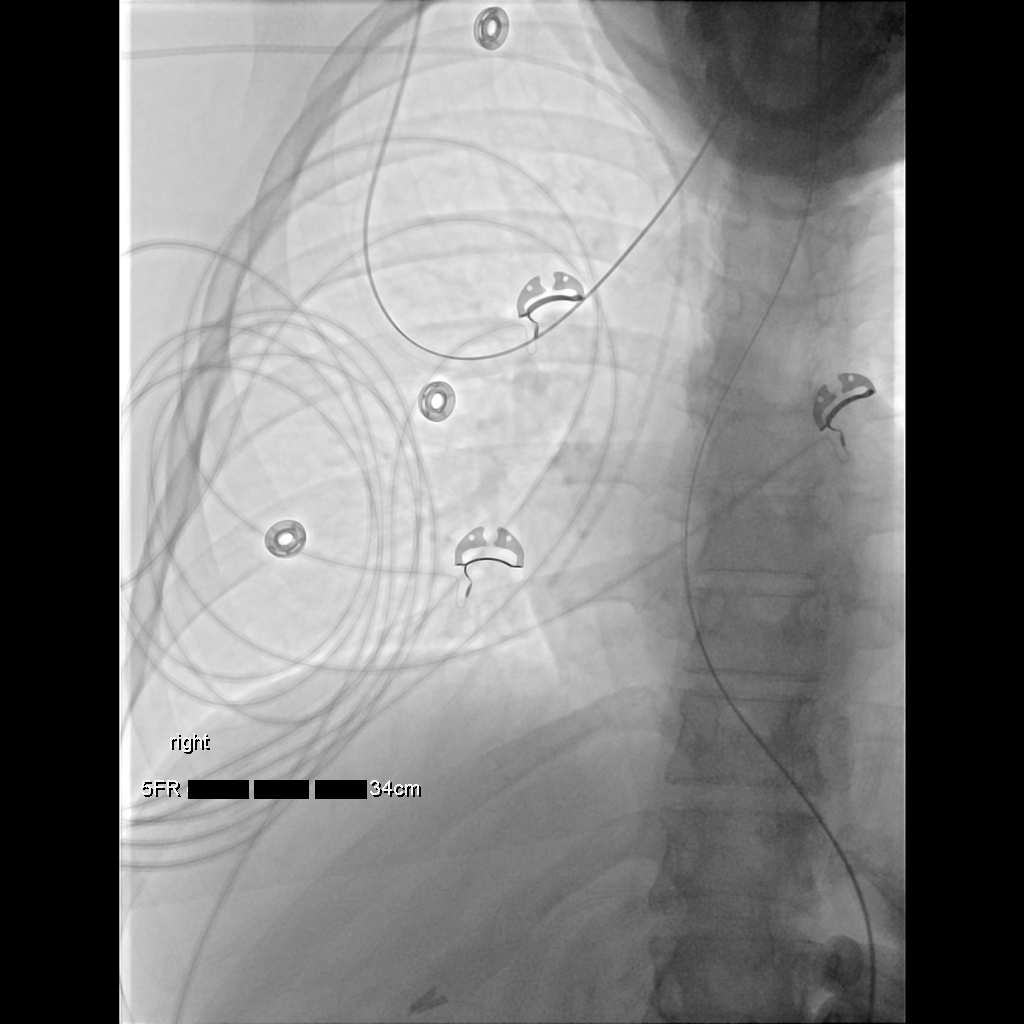

[1 of 1 positions shown; findings below may reference images not displayed]

The right upper extremity was prepped with chlorhexidine in a
sterile fashion, and a sterile drape was applied covering the
operative field. Maximum barrier sterile technique with sterile
gowns and gloves were used for the procedure. A timeout was
performed prior to the initiation of the procedure. Local anesthesia
was provided with 1% lidocaine.

Under direct ultrasound guidance, the right brachial vein was
accessed with a micropuncture kit after the overlying soft tissues
were anesthetized with 1% lidocaine. An ultrasound image was saved
for documentation purposes. A guidewire was advanced to the level of
the superior caval-atrial junction for measurement purposes and the
PICC line was cut to length. A peel-away sheath was placed and a 34
cm, 5 French, dual lumen was inserted to level of the superior
caval-atrial junction. A post procedure spot fluoroscopic was
obtained. The catheter easily aspirated and flushed and was sutured
in place. A dressing was placed. The patient tolerated the procedure
well without immediate post procedural complication.
FINDINGS: After catheter placement, the tip lies within the superior
cavoatrial junction. The catheter aspirates and flushes normally and
is ready for immediate use.
IMPRESSION: Successful ultrasound and fluoroscopic guided placement of a right
brachial vein approach, 34 cm, 5 French, dual lumen PICC with tip at
the superior caval-atrial junction. The PICC line is ready for
immediate use.

## 2017-03-25 MED ORDER — BENZTROPINE MESYLATE 1 MG/ML IJ SOLN
1.0000 mg | Freq: Two times a day (BID) | INTRAMUSCULAR | Status: DC
  Administered 2017-03-25 – 2017-04-02 (×18): 1 mg via INTRAVENOUS
  Filled 2017-03-25 (×24): qty 1

## 2017-03-25 MED ORDER — VANCOMYCIN HCL 500 MG IV SOLR
500.0000 mg | Freq: Two times a day (BID) | INTRAVENOUS | Status: DC
  Administered 2017-03-25 – 2017-03-27 (×4): 500 mg via INTRAVENOUS
  Filled 2017-03-25 (×4): qty 500

## 2017-03-25 MED ORDER — SODIUM CHLORIDE 0.9 % IV SOLN
INTRAVENOUS | Status: DC
  Administered 2017-03-25: 1.8 [IU]/h via INTRAVENOUS
  Filled 2017-03-25: qty 1

## 2017-03-25 MED ORDER — SODIUM CHLORIDE 0.9 % IV SOLN
250.0000 mg | Freq: Three times a day (TID) | INTRAVENOUS | Status: DC
  Filled 2017-03-25 (×2): qty 5

## 2017-03-25 MED ORDER — SODIUM CHLORIDE 0.9 % IV SOLN
INTRAVENOUS | Status: DC
  Administered 2017-03-25 – 2017-03-30 (×3): via INTRAVENOUS
  Administered 2017-03-31: 1000 mL via INTRAVENOUS
  Administered 2017-03-31 – 2017-04-01 (×2): via INTRAVENOUS

## 2017-03-25 MED ORDER — KCL IN DEXTROSE-NACL 40-5-0.45 MEQ/L-%-% IV SOLN
INTRAVENOUS | Status: DC
  Administered 2017-03-25: 75 mL/h via INTRAVENOUS
  Administered 2017-03-26 – 2017-03-30 (×7): via INTRAVENOUS
  Administered 2017-03-30 – 2017-03-31 (×2): 1000 mL via INTRAVENOUS
  Administered 2017-03-31: 06:00:00 via INTRAVENOUS
  Administered 2017-04-02: 75 mL/h via INTRAVENOUS
  Filled 2017-03-25 (×21): qty 1000

## 2017-03-25 MED ORDER — DEXTROSE 5 % IV SOLN
1.0000 g | Freq: Three times a day (TID) | INTRAVENOUS | Status: DC
  Administered 2017-03-25 – 2017-03-26 (×3): 1 g via INTRAVENOUS
  Filled 2017-03-25 (×5): qty 1

## 2017-03-25 MED ORDER — VANCOMYCIN HCL 10 G IV SOLR
1250.0000 mg | Freq: Once | INTRAVENOUS | Status: AC
  Administered 2017-03-25: 1250 mg via INTRAVENOUS
  Filled 2017-03-25: qty 1250

## 2017-03-25 MED ORDER — BENZTROPINE MESYLATE 1 MG/ML IJ SOLN
1.0000 mg | Freq: Once | INTRAMUSCULAR | Status: AC
  Administered 2017-03-24: 1 mg via INTRAVENOUS

## 2017-03-25 MED ORDER — LORAZEPAM 2 MG/ML IJ SOLN
0.5000 mg | INTRAMUSCULAR | Status: DC | PRN
  Administered 2017-03-25 – 2017-03-26 (×3): 0.5 mg via INTRAVENOUS
  Filled 2017-03-25 (×3): qty 1

## 2017-03-25 MED ORDER — ONDANSETRON HCL 4 MG/2ML IJ SOLN
4.0000 mg | Freq: Four times a day (QID) | INTRAMUSCULAR | Status: DC | PRN
  Administered 2017-03-25 – 2017-03-26 (×3): 4 mg via INTRAVENOUS
  Filled 2017-03-25 (×3): qty 2

## 2017-03-25 MED ORDER — LORAZEPAM 2 MG/ML IJ SOLN: 0.5000 mg | Freq: Once | INTRAMUSCULAR | Status: DC

## 2017-03-25 MED ORDER — SODIUM CHLORIDE 0.9 % IV SOLN: 250.0000 mL | INTRAVENOUS | Status: DC | PRN

## 2017-03-25 MED ORDER — INSULIN ASPART 100 UNIT/ML ~~LOC~~ SOLN
0.0000 [IU] | SUBCUTANEOUS | Status: DC
  Administered 2017-03-25: 11 [IU] via SUBCUTANEOUS

## 2017-03-25 MED ORDER — LORAZEPAM 2 MG/ML IJ SOLN
1.0000 mg | Freq: Once | INTRAMUSCULAR | Status: AC
  Administered 2017-03-25: 1 mg via INTRAVENOUS
  Filled 2017-03-25: qty 1

## 2017-03-25 MED ORDER — INSULIN DETEMIR 100 UNIT/ML ~~LOC~~ SOLN
12.0000 [IU] | Freq: Every day | SUBCUTANEOUS | Status: DC
  Administered 2017-03-25 – 2017-03-31 (×7): 12 [IU] via SUBCUTANEOUS
  Filled 2017-03-25 (×10): qty 0.12

## 2017-03-25 MED ORDER — SODIUM CHLORIDE 0.9 % IV SOLN
INTRAVENOUS | Status: DC
  Administered 2017-03-29 – 2017-03-30 (×2): via INTRAVENOUS
  Administered 2017-03-30: 1000 mL via INTRAVENOUS

## 2017-03-25 MED ORDER — INSULIN ASPART 100 UNIT/ML ~~LOC~~ SOLN
0.0000 [IU] | SUBCUTANEOUS | Status: DC
  Administered 2017-03-26: 3 [IU] via SUBCUTANEOUS
  Administered 2017-03-26: 5 [IU] via SUBCUTANEOUS
  Administered 2017-03-26 – 2017-03-27 (×2): 3 [IU] via SUBCUTANEOUS
  Administered 2017-03-27 (×3): 2 [IU] via SUBCUTANEOUS
  Administered 2017-03-28: 3 [IU] via SUBCUTANEOUS
  Administered 2017-03-28: 1 [IU] via SUBCUTANEOUS
  Administered 2017-03-28 (×2): 3 [IU] via SUBCUTANEOUS
  Administered 2017-03-28 – 2017-03-29 (×4): 2 [IU] via SUBCUTANEOUS
  Administered 2017-03-29 (×2): 3 [IU] via SUBCUTANEOUS
  Administered 2017-03-29 – 2017-03-30 (×3): 2 [IU] via SUBCUTANEOUS
  Administered 2017-03-30 (×2): 3 [IU] via SUBCUTANEOUS
  Administered 2017-03-30: 2 [IU] via SUBCUTANEOUS
  Administered 2017-03-31: 3 [IU] via SUBCUTANEOUS
  Administered 2017-03-31 (×2): 2 [IU] via SUBCUTANEOUS
  Administered 2017-03-31: 7 [IU] via SUBCUTANEOUS
  Administered 2017-03-31: 3 [IU] via SUBCUTANEOUS
  Administered 2017-03-31: 7 [IU] via SUBCUTANEOUS
  Administered 2017-04-01: 5 [IU] via SUBCUTANEOUS
  Administered 2017-04-01: 2 [IU] via SUBCUTANEOUS
  Administered 2017-04-01: 7 [IU] via SUBCUTANEOUS
  Administered 2017-04-01: 2 [IU] via SUBCUTANEOUS
  Administered 2017-04-01: 1 [IU] via SUBCUTANEOUS
  Administered 2017-04-01: 3 [IU] via SUBCUTANEOUS
  Administered 2017-04-02: 2 [IU] via SUBCUTANEOUS
  Administered 2017-04-02: 1 [IU] via SUBCUTANEOUS
  Administered 2017-04-02: 2 [IU] via SUBCUTANEOUS
  Administered 2017-04-02: 3 [IU] via SUBCUTANEOUS
  Administered 2017-04-02: 1 [IU] via SUBCUTANEOUS
  Administered 2017-04-03: 3 [IU] via SUBCUTANEOUS
  Administered 2017-04-03: 2 [IU] via SUBCUTANEOUS

## 2017-03-25 MED ORDER — ORAL CARE MOUTH RINSE
15.0000 mL | Freq: Two times a day (BID) | OROMUCOSAL | Status: DC
  Administered 2017-03-25 (×2): 15 mL via OROMUCOSAL

## 2017-03-25 MED ORDER — HEPARIN SODIUM (PORCINE) 5000 UNIT/ML IJ SOLN: 5000.0000 [IU] | Freq: Three times a day (TID) | INTRAMUSCULAR | Status: DC

## 2017-03-25 MED ORDER — SODIUM CHLORIDE 0.9 % IV SOLN
INTRAVENOUS | Status: DC
  Filled 2017-03-25: qty 1

## 2017-03-25 MED ORDER — PANTOPRAZOLE SODIUM 40 MG IV SOLR
40.0000 mg | INTRAVENOUS | Status: DC
  Administered 2017-03-25 – 2017-04-01 (×8): 40 mg via INTRAVENOUS
  Filled 2017-03-25 (×9): qty 40

## 2017-03-25 MED ORDER — LORAZEPAM 2 MG/ML IJ SOLN
0.5000 mg | Freq: Once | INTRAMUSCULAR | Status: AC
  Administered 2017-03-24: 0.5 mg via INTRAMUSCULAR

## 2017-03-25 MED ORDER — LORAZEPAM 2 MG/ML IJ SOLN
INTRAMUSCULAR | Status: AC
  Administered 2017-03-25: 0.5 mg via INTRAVENOUS
  Filled 2017-03-25: qty 1

## 2017-03-25 MED ORDER — DEXTROSE-NACL 5-0.45 % IV SOLN
INTRAVENOUS | Status: DC
  Administered 2017-03-25: 08:00:00 via INTRAVENOUS

## 2017-03-25 MED ORDER — SODIUM CHLORIDE 0.9 % IV SOLN: Freq: Once | INTRAVENOUS | Status: DC

## 2017-03-25 MED ORDER — HEPARIN SODIUM (PORCINE) 5000 UNIT/ML IJ SOLN
5000.0000 [IU] | Freq: Three times a day (TID) | INTRAMUSCULAR | Status: DC
  Administered 2017-03-25 – 2017-04-03 (×28): 5000 [IU] via SUBCUTANEOUS
  Filled 2017-03-25 (×29): qty 1

## 2017-03-25 MED ORDER — LORAZEPAM 2 MG/ML IJ SOLN
0.5000 mg | Freq: Once | INTRAMUSCULAR | Status: AC
  Administered 2017-03-25: 0.5 mg via INTRAVENOUS

## 2017-03-25 MED ORDER — SODIUM CHLORIDE 0.9 % IV SOLN: INTRAVENOUS | Status: DC

## 2017-03-25 MED ORDER — POTASSIUM CHLORIDE 10 MEQ/100ML IV SOLN: 10.0000 meq | INTRAVENOUS | Status: DC | PRN

## 2017-03-25 MED ORDER — CHLORHEXIDINE GLUCONATE 0.12 % MT SOLN
15.0000 mL | Freq: Two times a day (BID) | OROMUCOSAL | Status: DC
  Administered 2017-03-25 (×2): 15 mL via OROMUCOSAL
  Filled 2017-03-25: qty 15

## 2017-03-25 MED ORDER — LIDOCAINE HCL (PF) 1 % IJ SOLN
INTRAMUSCULAR | Status: DC | PRN
  Administered 2017-03-25: 10 mL

## 2017-03-25 MED ORDER — POTASSIUM CHLORIDE 10 MEQ/100ML IV SOLN: 10.0000 meq | INTRAVENOUS | Status: AC

## 2017-03-25 MED ORDER — LIDOCAINE HCL 1 % IJ SOLN
INTRAMUSCULAR | Status: AC
  Filled 2017-03-25: qty 20

## 2017-03-25 NOTE — ED Notes (Addendum)
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

## 2017-03-25 NOTE — Progress Notes (Signed)
Pharmacy Antibiotic Note  [name] is a 51 y.o. female admitted on 03/24/2017 with sepsis. She was admitted for nausea, vomiting and acute dystonia.  Pharmacy has been consulted for vancomycin and aztreonam dosing. Lactic acid was elevated on presentation and is now trending down. Pt is afebrile, WBC wnl, and blood cultures were drawn.   Plan: Vancomycin 1250mg  x1 (Loading Dose) Vancomycin 500 mg Q12h  Aztreonam 1g Q8h Follow LOT, cultures, clinical status, VT as indicated  Height: 5\' 1"  (154.9 cm) Weight: 134 lb 11.2 oz (61.1 kg) IBW/kg (Calculated) : 47.8  Temp (24hrs), Avg:98.1 F (36.7 C), Min:97.3 F (36.3 C), Max:98.5 F (36.9 C)   Recent Labs Lab 03/18/17 1213 03/19/17 0415 03/19/17 1146 03/20/17 0400 03/24/17 2335 03/24/17 2356 03/25/17 0623  WBC 7.7  --  8.8  --  6.7  --   --   CREATININE 1.06* 0.95  --  1.07* 0.95  --   --   LATICACIDVEN  --   --   --   --   --  3.63* 2.1*    Estimated Creatinine Clearance: 60 mL/min (by C-G formula based on SCr of 0.95 mg/dL).    Allergies  Allergen Reactions  . Anesthetics, Amide Nausea And Vomiting  . Penicillins Diarrhea and Nausea And Vomiting    Has patient had a PCN reaction causing immediate rash, facial/tongue/throat swelling, SOB or lightheadedness with hypotension: No Has patient had a PCN reaction causing severe rash involving mucus membranes or skin necrosis: No Has patient had a PCN reaction that required hospitalization No Has patient had a PCN reaction occurring within the last 10 years: Yes  If all of the above answers are "NO", then may proceed with Cephalosporin use.   . Buprenorphine Hcl Rash  . Encainide Nausea And Vomiting   Microbiology results: MRSA pcr 10/01>> Negative Blood Cx>> pending   Patterson Hammersmith PharmD PGY1 Pharmacy Practice Resident 03/25/2017 10:34 AM Pager: [phone]

## 2017-03-25 NOTE — Progress Notes (Signed)
[facility] Progress Note Patient Name: Kassidy Dockendorf DOB: 1965-10-22 MRN: [id number]   Date of Service  03/25/2017  HPI/Events of Note  Hypokalemia, DKA  eICU Interventions  Change fluids to D5 1/2NS with 63mEq KCl     Intervention Category Intermediate Interventions: Electrolyte abnormality - evaluation and management  Simonne Maffucci 03/25/2017, 10:01 PM

## 2017-03-25 NOTE — Progress Notes (Signed)
CRITICAL VALUE ALERT  Critical Value:  Lactic Acid 2.1  Date & Time Notied:  03/25/2017 01740  Provider Notified: Dr. Gilford Raid  Orders Received/Actions taken: None; level trending downward

## 2017-03-25 NOTE — ED Notes (Signed)
Placed on BP.

## 2017-03-25 NOTE — Progress Notes (Addendum)
Inpatient Diabetes Program Recommendations  AACE/ADA: New Consensus Statement on Inpatient Glycemic Control (2015)  Target Ranges:  Prepandial:   less than 140 mg/dL      Peak postprandial:   less than 180 mg/dL (1-2 hours)      Critically ill patients:  140 - 180 mg/dL   Lab Results  Component Value Date   GLUCAP 146 (H) 03/25/2017   HGBA1C 10.1 (H) 03/17/2017    Review of Glycemic Control  Diabetes history: DM1 Outpatient Diabetes medications: Levemir 16 units bid, Novolog 0-12 units tidwc Current orders for Inpatient glycemic control: IV insulin drip  Inpatient Diabetes Program Recommendations:   Per chart review patient to ED @ [facility] and transferred to [facility] approx 5 am today. Insulin not given until 5:08 am @ [employer]. Patient is type 1 and initial labs did not show DKA but labs @ [phone] am patient was in DKA. Insulin drip currently per glucostabilizer.  When criteria met and DKA resolved, please given basal insulin 2 hrs prior to discontinuance of IV insulin and give Novolog correction @ time of discontinued. Patient well known to DM Coordinators and last seen by DM Coordinator Lorenda Peck 03/18/17.  Will follow during hospitalization.  Thank you, Nani Gasser. Deon Ivey, RN, MSN, CDE  Diabetes Coordinator Inpatient Glycemic Control Team Team Pager [phone] (8am-5pm) 03/25/2017 1:19 PM

## 2017-03-25 NOTE — ED Notes (Signed)
Repeat lactate at 0400 per Dr. Randal Buba

## 2017-03-25 NOTE — ED Notes (Signed)
Alert, NAD, calmer, interactive, resps e/u, speaking in clear complete phrases, no dyspnea noted, skin cool & dry, VSS/improved, no active complaints at this time, registration at [facility] obtaining signature. Attempted 2nd IV by [place] x2 w/o success.

## 2017-03-25 NOTE — ED Notes (Signed)
[facility] here

## 2017-03-25 NOTE — Hospital Discharge Follow-Up (Signed)
The patient is known to the [facility] and has an appointment scheduled for 03/27/17 @ 0945. She has access to medicaid transportation to her medical appointments.  She also has an appointment with Dr Darleene Cleaver on 03/29/17 @ 1430 and an appointment with [facility] later this month.  She has also been referred to [facility].  Will continue to follow her hospital course.

## 2017-03-25 NOTE — Progress Notes (Signed)
Received patient via [facility] from [facility]. Dr. Jimmy Footman made aware of patient's arrival to unit.

## 2017-03-25 NOTE — ED Notes (Signed)
Dr. Randal Buba into see, at [facility]. Jaw mobility less rigid, no involuntary movements noted. BUE mobility improved, no longer rigid. BLE remain with increased rigidity, stiff ROM, (reports "was this way with last hospitalization"). Remains on BP, "not ready to come off".

## 2017-03-25 NOTE — ED Notes (Signed)
LAC of 3.63 panic results hand delivered to Dr. Randal Buba @ 0001. On 03/25/2017.

## 2017-03-25 NOTE — Procedures (Signed)
Right brachial picc line placed at cavoatrial junction No EBL No complications Ready for immediate use  Jayln Branscom E 3:42 PM 03/25/2017

## 2017-03-25 NOTE — Care Management Note (Addendum)
Case Management Note  Patient Details  Name: [name] MRN: [id number] Date of Birth: Mar 23, 1966  Subjective/Objective:    From home with her sister, presents with , DKA, severe gastroparesis, acute dystonic reaction, anxiety and depression,  Htn, Sepsis?,  Urinary retention, metabolic acidosis. She is established at [facility] with a follow up apt scheduled for 10/3 and a apt for endocirniology follow up at [facility] 10/5.    10/2 [place], BSN - NCM rescheduled apt with [facility] for 10/12 at 1400 per  Opal Sidles with  Transitional clinic Case Manager.    10/5 [place], BSN - per pt eval rec SNF, CSW following.              Action/Plan: NCM will follow for dc needs.   Expected Discharge Date:                  Expected Discharge Plan:  Home/Self Care  In-House Referral:     Discharge planning Services  CM Consult, Follow-up appt scheduled, [facility]  Post Acute Care Choice:    Choice offered to:     DME Arranged:    DME Agency:     HH Arranged:    HH Agency:     Status of Service:  In process, will continue to follow  If discussed at Long Length of Stay Meetings, dates discussed:    Additional Comments:  Zenon Mayo, RN 03/25/2017, 4:35 PM

## 2017-03-25 NOTE — ED Notes (Signed)
Removed from BP to protect skin & tissues, placed on BP initially for BM (no results), no urine or stool, will attempt again in a little while.

## 2017-03-25 NOTE — ED Notes (Addendum)
Attempted IV x1, unsuccessful, attempting US guided IV.

## 2017-03-25 NOTE — ED Notes (Signed)
Dr. Palumbo at [facility] 

## 2017-03-25 NOTE — H&P (Signed)
.. ..  Name: [name] MRN: [id number] DOB: [birth year]    ADMISSION DATE:  03/24/2017 CONSULTATION DATE:  03/25/17  REFERRING MD :  Randal Buba MD  CHIEF COMPLAINT:  Nausea and Vomitting  BRIEF PATIENT DESCRIPTION:  51 yr old female with PMHx IDDM and Gastroparesis frequent visits to ER for nausea and vomiting presents from [facility] recently treated for DKA.  Some adjustments were made in psychotropic meds now presenting with rigidity, spasms, cogwheeling.  No fever but elevated lactate.  Concern for serotonin syndrome was reason for transfer based on Poison control recs.   SIGNIFICANT EVENTS  Rigidity, spasms, cogwheeling. Elevated Lactate  STUDIES:   CXR   HISTORY OF PRESENT ILLNESS:  51 yr old female with PMHx Anxiety, Depression, Peripheral Neuropathy,  IDDM and Gastroparesis frequent visits to ER for nausea and vomitting presents from [facility] recently treated for DKA.  Some adjustments were made in her psychotropic meds ( buspirone, lyrica, fluoxetine)  now presenting to their ED with rigidity, spasms, cogwheeling.  No fever but elevated lactate.  Physician was concerned for serotonin syndrome patient was transferred based on Poison control recs.  Upon presentation ill appearing diaphoretic rigid and exquisitely nauseous, Had one episode of vomiting, pt unable to turn her head and has limited range of motion.  PAST MEDICAL HISTORY :   has a past medical history of Depression; Diabetes mellitus without complication ([place]); Essential hypertension; Gastroparesis; GERD (gastroesophageal reflux disease); and HLD (hyperlipidemia).  has a past surgical history that includes Retinal detachment surgery; POSTERIOR VITRECTOMY AND MEMBRANE PEEL-LEFT EYE (09/28/2002); POSTERIOR VITRECTOMY AND MEMBRANE PEEL-RIGHT EYE (03/16/2002); Eye surgery; gailstones; Esophagogastroduodenoscopy (09/27/2014); Colonoscopy (09/27/2014); ir generic historical (01/24/2016); ir generic historical (01/24/2016); IR US Guide Vasc Access  Right (02/01/2017); IR Fluoro Guide CV Line Right (02/01/2017); IR US Guide Vasc Access Right (03/06/2017); and IR Fluoro Guide CV Line Right (03/06/2017). Prior to Admission medications   Medication Sig Start Date End Date Taking? Authorizing Provider  albuterol (PROVENTIL HFA;VENTOLIN HFA) 108 (90 Base) MCG/ACT inhaler Inhale 2 puffs into the lungs every 6 (six) hours as needed for wheezing or shortness of breath. 10/20/15   [name], MD  atorvastatin (LIPITOR) 40 MG tablet Take 1 tablet (40 mg total) by mouth daily. 10/30/16   [name], MD  Blood Glucose Monitoring Suppl (ACCU-CHEK AVIVA) device Use as instructed 3 times daily before meals 10/30/16   [name], MD  busPIRone (BUSPAR) 10 MG tablet Take 1 tablet (10 mg total) by mouth 2 (two) times daily. 10/30/16   [name], MD  famotidine (PEPCID) 20 MG tablet Take 1 tablet (20 mg total) by mouth 2 (two) times daily. 09/29/16   Doreatha Lew, MD  ferrous sulfate 325 (65 FE) MG tablet Take 1 tablet (325 mg total) by mouth daily with breakfast. 03/27/16   Donne Hazel, MD  FLUoxetine (PROZAC) 20 MG tablet Take 2 tablets (40 mg total) by mouth daily. 10/30/16   [name], MD  furosemide (LASIX) 40 MG tablet Take 40 mg by mouth daily as needed for fluid.     [provider]  glucose blood (ACCU-CHEK AVIVA) test strip Use as instructed 3 times daily before meals 10/30/16   [name], MD  insulin aspart (NOVOLOG) 100 UNIT/ML injection Inject 0-12 Units into the skin 3 (three) times daily with meals. Per sliding scale 10/30/16   [name], MD  insulin detemir (LEVEMIR) 100 UNIT/ML injection Inject 0.16 mLs (16 Units total) into the skin 2 (two) times daily. 02/03/17  Eugenie Filler, MD  Insulin Syringe-Needle U-100 (BD INSULIN SYRINGE ULTRAFINE) 31G X 15/64" 0.5 ML MISC 1 each by Does not apply route 4 (four) times daily as needed. 08/16/15   [name], MD  Lancet Devices [facility]) lancets Use as instructed 3  times daily before meals 10/30/16   [name], MD  lisinopril (PRINIVIL,ZESTRIL) 5 MG tablet Take 1 tablet (5 mg total) by mouth daily. 03/08/17   Charlynne Cousins, MD  LORazepam (ATIVAN) 1 MG tablet Take 1 tablet (1 mg total) by mouth every 8 (eight) hours as needed for anxiety. Patient not taking: Reported on 01/30/2017 01/09/17   Varney Biles, MD  metoCLOPramide (REGLAN) 5 MG/5ML solution Take 10 mLs (10 mg total) by mouth 4 (four) times daily -  before meals and at bedtime. 03/20/17   Lavina Hamman, MD  ondansetron (ZOFRAN ODT) 4 MG disintegrating tablet Take 1 tablet (4 mg total) by mouth every 8 (eight) hours as needed for nausea or vomiting. 09/04/16   [name], MD  polyethylene glycol (MIRALAX / GLYCOLAX) packet Take 17 g by mouth daily as needed for mild constipation. 09/04/16   [name], MD  pregabalin (LYRICA) 150 MG capsule Take 1 capsule (150 mg total) by mouth 2 (two) times daily. 01/17/17   Argentina Donovan, PA-C  vitamin B-12 (CYANOCOBALAMIN) 1000 MCG tablet Take 1 tablet (1,000 mcg total) by mouth daily. 02/03/17   Eugenie Filler, MD   Allergies  Allergen Reactions  . Anesthetics, Amide Nausea And Vomiting  . Penicillins Diarrhea and Nausea And Vomiting    Has patient had a PCN reaction causing immediate rash, facial/tongue/throat swelling, SOB or lightheadedness with hypotension: No Has patient had a PCN reaction causing severe rash involving mucus membranes or skin necrosis: No Has patient had a PCN reaction that required hospitalization No Has patient had a PCN reaction occurring within the last 10 years: Yes  If all of the above answers are "NO", then may proceed with Cephalosporin use.   . Buprenorphine Hcl Rash  . Encainide Nausea And Vomiting    FAMILY HISTORY:  family history includes Cystic fibrosis in her mother; Diabetes in her brother; Hypertension in her father and maternal grandmother. SOCIAL HISTORY:  reports that she has never smoked. She  has never used smokeless tobacco. She reports that she does not drink alcohol or use drugs.  REVIEW OF SYSTEMS:   Constitutional: Negative for fever, chills, weight loss, malaise/fatigue and diaphoresis.  HENT: Negative for hearing loss, ear pain, nosebleeds, congestion, sore throat, neck pain, tinnitus and ear discharge.   Eyes: Negative for blurred vision, double vision, photophobia, pain, discharge and redness.  Respiratory: Negative for cough, hemoptysis, sputum production, shortness of breath, wheezing and stridor.   Cardiovascular: Negative for chest pain, palpitations, orthopnea, claudication, leg swelling and PND.  Gastrointestinal: Negative for heartburn, nausea, vomiting, abdominal pain, diarrhea, constipation, blood in stool and melena.  Genitourinary: Negative for dysuria, urgency, frequency, hematuria and flank pain.  Musculoskeletal: Negative for myalgias, back pain, joint pain and falls.  Skin: Negative for itching and rash.  Neurological: Negative for dizziness, tingling, tremors, sensory change, speech change, focal weakness, seizures, loss of consciousness, weakness and headaches.  Endo/Heme/Allergies: Negative for environmental allergies and polydipsia. Does not bruise/bleed easily.   VITAL SIGNS: Temp:  [97.3 F (36.3 C)-98.5 F (36.9 C)] 98.2 F (36.8 C) (10/01 0700) Pulse Rate:  [89-107] 96 (10/01 0615) Resp:  [12-48] 21 (10/01 0615) BP: (141-166)/(55-114) 156/65 (10/01 0615) SpO2:  [  95 %-100 %] 100 % (10/01 0615) Weight:  [61.1 kg (134 lb 11.2 oz)-61.7 kg (136 lb)] 61.1 kg (134 lb 11.2 oz) (10/01 0436)  PHYSICAL EXAMINATION: General: withdrawn diaphoretic uncomfortable Neuro:  Verbally responsive slow EOM movements unable to turn head towards me HEENT: normocephalic atraumatic, dry oral mucosa Cardiovascular:  S1 and S2 heard tachycardic Lungs: clear to auscultation bilaterally Abdomen: soft hypoactive BS, pt guarding, palpation makes her very  nauseated Musculoskeletal:  Rigidity in all 4 extremities limited ROM Skin:  No rash   Recent Labs Lab 03/19/17 0415 03/20/17 0400 03/24/17 2335  NA 135 137 137  K 4.4 4.4 4.1  CL 104 103 100*  CO2 25 26 23   BUN 17 15 15   CREATININE 0.95 1.07* 0.95  GLUCOSE 315* 188* 294*    Recent Labs Lab 03/18/17 1213 03/19/17 1146 03/24/17 2335  HGB 8.3* 8.8* 9.5*  HCT 26.5* 27.1* 31.0*  WBC 7.7 8.8 6.7  PLT 388 348 467*   Dg Chest Port 1 View  Result Date: 03/25/2017 CLINICAL DATA:  Sepsis, diabetes. EXAM: PORTABLE CHEST 1 VIEW COMPARISON:  Portable chest x-ray of March 07, 2017 FINDINGS: The lungs are adequately inflated and clear. The heart and mediastinal structures are normal. There is no pleural effusion. The bony thorax is unremarkable. The right-sided PICC line has been removed. IMPRESSION: There is no active cardiopulmonary disease. Electronically Signed   By: David  Martinique M.D.   On: 03/25/2017 07:18    ASSESSMENT / PLAN: NEURO: Acute Dystonic Reaction Notably rigid , HTN, HR in 90s  H/o fluoxetine No fever - unlikely that this is Serotonin Syndrome However pt has ah/o Metoclopramide for her Gastroparesis  Her symptoms of nausea and vomiting have worsened  She may have increased amt she was taking Discussed with Pharmacy She had some improvement with 1 mg cogentin given at outside care faciltity Will start Cogentin IV mg BID Observe patient's response  It can be titrated up by 0.5mg  over a few days until response is achieved.  Generalized Anxiety and Depression Previously on Buspirone and Fluoxetine Currently held due to NPO and actively vomiting   CARDIAC: HTN  Currently NPO Oral antihypertensives held due to nausea and vomiting  PULMONARY: On room air  Sat >95% In DKA-  CXR to r/o any acute process No infiltrate seen  Pt not having any cough or fevers  Low index of suspicion for any acute process at this time   ID: Sepsis? No confirmed  source Urine no signs of active infection Blood Cultures x 2- drawn on arrival LA elevated at 3.6 trending down to 2.1  will cover with Empiric abx not ept has allergies to penicillins Vancomycin and Azetronam per Pharmacy recs for dosing Trend WBC count, fever curve and LA  Endocrine: TSH sent IDDM currently in DKA Started on IV insulin  GI: Her chief complaint on most of her ER visits is Nausea and Vomitting NPO Zofran for nausea Severe Gastroparesis Started on Erythromycin IV 250mg  once stabilized she will need f/u by GI for advanced therapies If medical Rx fails will she be a candidate for a PEG tube to decompress  Or gastric electrical stimulation Or surgery- recommend GI to see patient once stable Started on a PPI for prophylaxis  Heme: Hgb 9.5 Reactive thrombocytosis 467 plts No signs of active bleeding No h/o coagulopathy Heparin [place] with SCDs for VTE PPx  RENAL/ GU  URINARY RETENTION - 1500 cc of urine obtained on straight cath - insert foley  catheter monitor Is and Os - Baseline Creatinine 0.9 no evidence of Acute Kidney Injury - on DKA protocol concern for pseudohyperkalemia Aggressive potassium replacement if K+ drops below 4 .. Lab Results  Component Value Date   CREATININE 0.95 03/24/2017   CREATININE 1.07 (H) 03/20/2017   CREATININE 0.95 59/56/3875  AG metabolic acidosis secondary to DKA and Lactic acidosis Lactic trending down Started on protocol AG 14 IV insulin with NS IVF  When BG 250 switch to D5 1/2 NS When Ag closes give long acting insulin prior to discontinuing IV drip    I, Dr Seward Carol have personally reviewed patient's available data, including medical history, events of note, physical examination and test results as part of my evaluation. I have discussed with resident/NP and other care providers such as pharmacist, and RN. The patient is critically ill with multiple organ systems failure and requires high complexity decision  making for assessment and support, frequent evaluation and titration of therapies, application of advanced monitoring technologies and extensive interpretation of multiple databases. Critical Care Time devoted to patient care services described in this note is 59 Minutes. This time reflects time of care of this signee Dr Seward Carol. This critical care time does not reflect procedure time, or teaching time or supervisory time but could involve care discussion time    DISPOSITION: ICU CC TIME: 85 mins PROGNOSIS: Guarded FAMILY: not present at bedside CODE STATUS: Full   Signed Dr Seward Carol Pulmonary Critical Care [facility] [place] Pager: [phone]  03/25/2017, 7:56 AM

## 2017-03-25 NOTE — ED Notes (Signed)
Alert, NAD, calm, interactive, resps e/u, RR no longer kussmaul, RR 22 and shallow, states, "feel better", speaking in clear complete phrases, answering questions appropriately, no dyspnea noted, no longer vomiting, nausea remains, skin drier and clammy, VSS, reports stomach 8/10, denies other pain. Admits to continued pain, nausea and sob.

## 2017-03-25 NOTE — ED Notes (Signed)
[place] contacted by Dr. Randal Buba

## 2017-03-25 NOTE — ED Notes (Signed)
Dr. Tyrone Nine at [facility] for [place] IV

## 2017-03-25 NOTE — Progress Notes (Signed)
.. ..  Name: [name] MRN: [id number] DOB: Dec 13, 1965    ADMISSION DATE:  03/24/2017 CONSULTATION DATE:  03/25/17  REFERRING MD :  Randal Buba MD  CHIEF COMPLAINT:  Nausea and Vomitting  BRIEF PATIENT DESCRIPTION:  51 yr old female with PMHx IDDM and Gastroparesis frequent visits to ER for nausea and vomiting presents from [facility] recently treated for DKA.  Some adjustments were made in psychotropic meds now presenting with rigidity, spasms, cogwheeling.  No fever but elevated lactate.  Concern for serotonin syndrome was reason for transfer based on Poison control recs.   SIGNIFICANT EVENTS  Rigidity, spasms, cogwheeling. Elevated Lactate  STUDIES:  CXR  HISTORY OF PRESENT ILLNESS:  51 yr old female with PMHx Anxiety, Depression, Peripheral Neuropathy,  IDDM and Gastroparesis frequent visits to ER for nausea and vomitting presents from [facility] recently treated for DKA.  Some adjustments were made in her psychotropic meds ( buspirone, lyrica, fluoxetine)  now presenting to their ED with rigidity, spasms, cogwheeling.  No fever but elevated lactate.  Physician was concerned for serotonin syndrome patient was transferred based on Poison control recs.  Upon presentation ill appearing diaphoretic rigid and exquisitely nauseous, Had one episode of vomiting, pt unable to turn her head and has limited range of motion.  VITAL SIGNS: Temp:  [97.3 F (36.3 C)-98.5 F (36.9 C)] 98.2 F (36.8 C) (10/01 0700) Pulse Rate:  [89-107] 91 (10/01 0830) Resp:  [12-48] 27 (10/01 0830) BP: (141-167)/(55-114) 162/61 (10/01 0830) SpO2:  [95 %-100 %] 100 % (10/01 0830) Weight:  [61.1 kg (134 lb 11.2 oz)-61.7 kg (136 lb)] 61.1 kg (134 lb 11.2 oz) (10/01 0436)  PHYSICAL EXAMINATION: General: No events overnight Neuro:  Awake and interactive, slow to respond but responds HEENT: Normocephalic atraumatic, dry oral mucosa Cardiovascular:  RRR, Nl S1 and S2 and -M/R/G Lungs: CTA bilaterally Abdomen: Soft, NT, ND  and +BS, NGT in place Musculoskeletal: Rigidity in all 4 extremities limited ROM Skin: No rash  Recent Labs Lab 03/19/17 0415 03/20/17 0400 03/24/17 2335  NA 135 137 137  K 4.4 4.4 4.1  CL 104 103 100*  CO2 25 26 23   BUN 17 15 15   CREATININE 0.95 1.07* 0.95  GLUCOSE 315* 188* 294*   Recent Labs Lab 03/18/17 1213 03/19/17 1146 03/24/17 2335  HGB 8.3* 8.8* 9.5*  HCT 26.5* 27.1* 31.0*  WBC 7.7 8.8 6.7  PLT 388 348 467*   Dg Chest Port 1 View  Result Date: 03/25/2017 CLINICAL DATA:  Sepsis, diabetes. EXAM: PORTABLE CHEST 1 VIEW COMPARISON:  Portable chest x-ray of March 07, 2017 FINDINGS: The lungs are adequately inflated and clear. The heart and mediastinal structures are normal. There is no pleural effusion. The bony thorax is unremarkable. The right-sided PICC line has been removed. IMPRESSION: There is no active cardiopulmonary disease. Electronically Signed   By: David  Martinique M.D.   On: 03/25/2017 07:18   Dg Abd Portable 1v  Result Date: 03/25/2017 CLINICAL DATA:  NG tube placement. EXAM: PORTABLE ABDOMEN - 1 VIEW COMPARISON:  02/05/2017 FINDINGS: An enteric tube overlies the stomach with tip and side hole well beyond the GE junction. A moderate amount of stool is again seen throughout the colon. No dilated loops of bowel are seen to suggest obstruction. Cholecystectomy clips are noted. No acute osseous abnormality is seen. IMPRESSION: Enteric tube in the stomach. Electronically Signed   By: Logan Bores M.D.   On: 03/25/2017 10:27   ASSESSMENT / PLAN: NEURO: Acute Dystonic Reaction Notably  rigid , HTN, HR in 90s  H/o fluoxetine However pt has a h/o Metoclopramide for her Gastroparesis  Her symptoms of nausea and vomiting have worsened  Discussed with Pharmacy She had some improvement with 1 mg cogentin given at outside care facility, will continue, Cogentin IV 1 mg BID It can be titrated up by 0.5mg  over a few days until response is achieved.  Generalized Anxiety  and Depression Previously on Buspirone and Fluoxetine Currently held due to NPO and actively vomiting  CARDIAC: HTN  Currently NPO Oral antihypertensives held due to nausea and vomiting  PULMONARY: On room air  Sat >95% In DKA-  CXR to r/o any acute process No infiltrate seen  Pt not having any cough or fevers  Low index of suspicion for any acute process at this time  ID: Sepsis? No confirmed source Urine no signs of active infection Blood Cultures x 2- drawn on arrival LA elevated at 3.6 trending down to 2.1, hold further draws Will cover with Empiric abx not ept has allergies to penicillins D/C erythromycin (on back order) Vancomycin and Azetronam per Pharmacy recs for dosing Trend WBC count, fever curve and LA  Endocrine: TSH sent IDDM currently in DKA Started on IV insulin  GI: Her chief complaint on most of her ER visits is Nausea and Vomitting NPO Zofran for nausea Severe Gastroparesis Started on Erythromycin IV 250mg  once stabilized she will need f/u by GI for advanced therapies If medical Rx fails will she be a candidate for a PEG tube to decompress  Or gastric electrical stimulation Or surgery- recommend GI to see patient once stable PPI for prophylaxis  Heme: Hgb 9.5 Reactive thrombocytosis 467 plts No signs of active bleeding No h/o coagulopathy Heparin [place] with SCDs for VTE PPx  RENAL/ GU URINARY RETENTION - Inserted foley catheter monitor Is and Os - Baseline Creatinine 0.9 no evidence of Acute Kidney Injury - On DKA protocol concern for pseudohyperkalemia - Aggressive potassium replacement if K+ drops below 4 .. Lab Results  Component Value Date   CREATININE 0.95 03/24/2017   CREATININE 1.07 (H) 03/20/2017   CREATININE 0.95 48/18/5631  AG metabolic acidosis secondary to DKA and Lactic acidosis Lactic trending down Started on protocol AG 14 IV insulin with NS IVF  When BG 250 switch to D5 1/2 NS When Ag closes give long acting insulin  prior to discontinuing IV drip  Transfer to SDU and to [facility] with PCCM off 10/2.  The patient is critically ill with multiple organ systems failure and requires high complexity decision making for assessment and support, frequent evaluation and titration of therapies, application of advanced monitoring technologies and extensive interpretation of multiple databases.   Critical Care Time devoted to patient care services described in this note is  45  Minutes. This time reflects time of care of this signee Dr Jennet Maduro. This critical care time does not reflect procedure time, or teaching time or supervisory time of PA/NP/Med student/Med Resident etc but could involve care discussion time.  Rush Farmer, M.D. [facility] Pulmonary/Critical Care Medicine. Pager: [phone]. After hours pager: [phone].  03/25/2017, 10:42 AM

## 2017-03-25 NOTE — Discharge Summary (Signed)
[facility] Discharge Summary   Patient: [name] NTI:[id number]   PCP: [name], MD DOB: October 10, 1965   Date of admission: 03/15/2017   Date of discharge: 03/20/2017   Discharge Diagnoses:  Principal Problem:   DKA (diabetic ketoacidoses) ([place]) Active Problems:   Anemia   Essential hypertension   Gastroparesis   Tachycardia   Admitted From: home Disposition:  home  Recommendations for Outpatient Follow-up:  1. follow-up with PCP in one week   Follow-up [place]. Go on 03/27/2017.   Why:  at 9:45 am for an appointment with Dr Jarold Song. Contact information: [place] [phone]         Diet recommendation: cardiac diet carb modified  Activity: The patient is advised to gradually reintroduce usual activities.  Discharge Condition: good  Code Status: full code  History of present illness: As per the H and P dictated on admission, "[name]  is a 51 y.o. female, w poorly controlled Dm2, Diabetic gastroparesis apparently n/v several times today.  ? Noncompliance with medication. Pt denies fever, chills, cough, cp, palp, sob, n/v, diarrhea, dysuria, hematuria.  Pt presented to ED cause of n/v, and her sugar was high.    In ED  bs 555, Bun 15/creatinine 1.15  Hco3=15,   Wbc 17.0, Hgb 10.0, Plt 520.  Pt willl be admitted for DKA"  Hospital Course:  Summary of her active problems in the hospital is as following. Principal Problem:   DKA (diabetic ketoacidoses) ([place]) Patient presented with hyperglycemia as well as anion gap. Started on IV fluids and IV insulin. Anion gap corrected quickly patient was ultimately transitioned to subcutaneous insulin the time of my evaluation. Patient was able to tolerate oral diet. She mentions that she ran out of her Reglan which caused her to have nausea and vomiting and she did not use her insulin after that. Patient was given  prescription for Reglan. Recommend to continue home regimen.  Active Problems:   Anemia emoglobin stable no active bleeding. Continue monitoring as an outpatient.    Essential hypertension Continue current regimen.    Gastroparesis new prescription for Reglan was given to the patient.    Tachycardia From dehydration. No acute abnormality.  All other chronic medical condition were stable during the hospitalization.  Patient was ambulatory without any assistance. On the day of the discharge the patient's vitals were stable, and no other acute medical condition were reported by patient. the patient was felt safe to be discharge at home with family.  Procedures and Results:  none   Consultations:  none  DISCHARGE MEDICATION: Discharge Medication List as of 03/20/2017  3:13 PM    CONTINUE these medications which have CHANGED   Details  metoCLOPramide (REGLAN) 5 MG/5ML solution Take 10 mLs (10 mg total) by mouth 4 (four) times daily -  before meals and at bedtime., Starting Wed 03/20/2017, Normal      CONTINUE these medications which have NOT CHANGED   Details  albuterol (PROVENTIL HFA;VENTOLIN HFA) 108 (90 Base) MCG/ACT inhaler Inhale 2 puffs into the lungs every 6 (six) hours as needed for wheezing or shortness of breath., Starting 10/20/2015, Until Discontinued, Normal    atorvastatin (LIPITOR) 40 MG tablet Take 1 tablet (40 mg total) by mouth daily., Starting Tue 10/30/2016, Normal    Blood Glucose Monitoring Suppl (ACCU-CHEK AVIVA) device Use as instructed 3 times daily before meals, Normal    busPIRone (BUSPAR) 10 MG  tablet Take 1 tablet (10 mg total) by mouth 2 (two) times daily., Starting Tue 10/30/2016, Normal    famotidine (PEPCID) 20 MG tablet Take 1 tablet (20 mg total) by mouth 2 (two) times daily., Starting Sat 09/29/2016, Print    ferrous sulfate 325 (65 FE) MG tablet Take 1 tablet (325 mg total) by mouth daily with breakfast., Starting Tue 03/27/2016, No Print      FLUoxetine (PROZAC) 20 MG tablet Take 2 tablets (40 mg total) by mouth daily., Starting Tue 10/30/2016, Normal    furosemide (LASIX) 40 MG tablet Take 40 mg by mouth daily as needed for fluid. , Historical Med    glucose blood (ACCU-CHEK AVIVA) test strip Use as instructed 3 times daily before meals, Normal    insulin aspart (NOVOLOG) 100 UNIT/ML injection Inject 0-12 Units into the skin 3 (three) times daily with meals. Per sliding scale, Starting Tue 10/30/2016, Normal    insulin detemir (LEVEMIR) 100 UNIT/ML injection Inject 0.16 mLs (16 Units total) into the skin 2 (two) times daily., Starting Sun 02/03/2017, Print    Insulin Syringe-Needle U-100 (BD INSULIN SYRINGE ULTRAFINE) 31G X 15/64" 0.5 ML MISC 1 each by Does not apply route 4 (four) times daily as needed., Starting 08/16/2015, Until Discontinued, Normal    Lancet Devices (ACCU-CHEK SOFTCLIX) lancets Use as instructed 3 times daily before meals, Normal    lisinopril (PRINIVIL,ZESTRIL) 5 MG tablet Take 1 tablet (5 mg total) by mouth daily., Starting Fri 03/08/2017, Normal    LORazepam (ATIVAN) 1 MG tablet Take 1 tablet (1 mg total) by mouth every 8 (eight) hours as needed for anxiety., Starting Wed 01/09/2017, Print    ondansetron (ZOFRAN ODT) 4 MG disintegrating tablet Take 1 tablet (4 mg total) by mouth every 8 (eight) hours as needed for nausea or vomiting., Starting Tue 09/04/2016, Normal    polyethylene glycol (MIRALAX / GLYCOLAX) packet Take 17 g by mouth daily as needed for mild constipation., Starting Tue 09/04/2016, Normal    pregabalin (LYRICA) 150 MG capsule Take 1 capsule (150 mg total) by mouth 2 (two) times daily., Starting Thu 01/17/2017, Print    vitamin B-12 (CYANOCOBALAMIN) 1000 MCG tablet Take 1 tablet (1,000 mcg total) by mouth daily., Starting Sun 02/03/2017, OTC       Allergies  Allergen Reactions  . Anesthetics, Amide Nausea And Vomiting  . Penicillins Diarrhea and Nausea And Vomiting    Has patient had a PCN  reaction causing immediate rash, facial/tongue/throat swelling, SOB or lightheadedness with hypotension: No Has patient had a PCN reaction causing severe rash involving mucus membranes or skin necrosis: No Has patient had a PCN reaction that required hospitalization No Has patient had a PCN reaction occurring within the last 10 years: Yes  If all of the above answers are "NO", then may proceed with Cephalosporin use.   . Buprenorphine Hcl Rash  . Encainide Nausea And Vomiting   Discharge Instructions    Diet - low sodium heart healthy    Complete by:  As directed    Diet Carb Modified    Complete by:  As directed    Discharge instructions    Complete by:  As directed    It is important that you read following instructions as well as go over your medication list with RN to help you understand your care after this hospitalization.  Discharge Instructions: Please follow-up with PCP in one week  Please request your primary care physician to go over all Hospital Tests and Procedure/Radiological  results at the follow up,  Please get all Hospital records sent to your PCP by signing hospital release before you go home.   Do not take more than prescribed Pain, Sleep and Anxiety Medications. You were cared for by a hospitalist during your hospital stay. If you have any questions about your discharge medications or the care you received while you were in the hospital after you are discharged, you can call the unit and ask to speak with the hospitalist on call if the hospitalist that took care of you is not available.  Once you are discharged, your primary care physician will handle any further medical issues. Please note that NO REFILLS for any discharge medications will be authorized once you are discharged, as it is imperative that you return to your primary care physician (or establish a relationship with a primary care physician if you do not have one) for your aftercare needs so that they can  reassess your need for medications and monitor your lab values. You Must read complete instructions/literature along with all the possible adverse reactions/side effects for all the Medicines you take and that have been prescribed to you. Take any new Medicines after you have completely understood and accept all the possible adverse reactions/side effects. Wear Seat belts while driving. If you have smoked or chewed Tobacco in the last 2 yrs please stop smoking and/or stop any Recreational drug use.   Increase activity slowly    Complete by:  As directed      Discharge Exam: Filed Weights   03/16/17 0100 03/16/17 0600 03/17/17 1700  Weight: 59.9 kg (132 lb) 56.4 kg (124 lb 5.4 oz) 61.7 kg (136 lb 0.4 oz)   Vitals:   03/20/17 0400 03/20/17 1440  BP: 113/60 (!) 153/73  Pulse: 79 82  Resp: 14 18  Temp: 98.5 F (36.9 C) 98.4 F (36.9 C)  SpO2: 100% 100%   General: Appear in no distress, no Rash; Oral Mucosa moist Cardiovascular: S1 and S2 Present, no Murmur, no JVD Respiratory: Bilateral Air entry present and Clear to Auscultation, no Crackles, no wheezes Abdomen: Bowel Sound present, Soft and no tenderness Extremities: no Pedal edema, no calf tenderness Neurology: Grossly no focal neuro deficit.  The results of significant diagnostics from this hospitalization (including imaging, microbiology, ancillary and laboratory) are listed below for reference.    Significant Diagnostic Studies: Ir Fluoro Guide Cv Line Right  Result Date: 03/06/2017 INDICATION: DIABETES, DKA, ACCESS FOR IV THERAPY AND HYDRATION EXAM: ULTRASOUND AND FLUOROSCOPIC GUIDED PICC LINE INSERTION MEDICATIONS: 1% PROCAINE LOCALLY CONTRAST:  None FLUOROSCOPY TIME:  Six seconds (less than 1 mGy) COMPLICATIONS: None immediate. TECHNIQUE: The procedure, risks, benefits, and alternatives were explained to the patient and informed written consent was obtained. A timeout was performed prior to the initiation of the procedure. The  right upper extremity was prepped with chlorhexidine in a sterile fashion, and a sterile drape was applied covering the operative field. Maximum barrier sterile technique with sterile gowns and gloves were used for the procedure. A timeout was performed prior to the initiation of the procedure. Local anesthesia was provided with 1% lidocaine. Under direct ultrasound guidance, the right brachial vein was accessed with a micropuncture kit after the overlying soft tissues were anesthetized with 1% lidocaine. An ultrasound image was saved for documentation purposes. A guidewire was advanced to the level of the superior caval-atrial junction for measurement purposes and the PICC line was cut to length. A peel-away sheath was placed and a 33  cm, 5 [place], dual lumen was inserted to level of the superior caval-atrial junction. A post procedure spot fluoroscopic was obtained. The catheter easily aspirated and flushed and was sutured in place. A dressing was placed. The patient tolerated the procedure well without immediate post procedural complication. FINDINGS: After catheter placement, the tip lies within the superior cavoatrial junction. The catheter aspirates and flushes normally and is ready for immediate use. IMPRESSION: Successful ultrasound and fluoroscopic guided placement of a right brachial vein approach, 33 cm, 5 French, dual lumen PICC with tip at the superior caval-atrial junction. The PICC line is ready for immediate use. Electronically Signed   By: Jerilynn Mages.  Shick M.D.   On: 03/06/2017 12:24   Ir US Guide Vasc Access Right  Result Date: 03/06/2017 INDICATION: DIABETES, DKA, ACCESS FOR IV THERAPY AND HYDRATION EXAM: ULTRASOUND AND FLUOROSCOPIC GUIDED PICC LINE INSERTION MEDICATIONS: 1% PROCAINE LOCALLY CONTRAST:  None FLUOROSCOPY TIME:  Six seconds (less than 1 mGy) COMPLICATIONS: None immediate. TECHNIQUE: The procedure, risks, benefits, and alternatives were explained to the patient and informed written  consent was obtained. A timeout was performed prior to the initiation of the procedure. The right upper extremity was prepped with chlorhexidine in a sterile fashion, and a sterile drape was applied covering the operative field. Maximum barrier sterile technique with sterile gowns and gloves were used for the procedure. A timeout was performed prior to the initiation of the procedure. Local anesthesia was provided with 1% lidocaine. Under direct ultrasound guidance, the right brachial vein was accessed with a micropuncture kit after the overlying soft tissues were anesthetized with 1% lidocaine. An ultrasound image was saved for documentation purposes. A guidewire was advanced to the level of the superior caval-atrial junction for measurement purposes and the PICC line was cut to length. A peel-away sheath was placed and a 33 cm, 5 [place], dual lumen was inserted to level of the superior caval-atrial junction. A post procedure spot fluoroscopic was obtained. The catheter easily aspirated and flushed and was sutured in place. A dressing was placed. The patient tolerated the procedure well without immediate post procedural complication. FINDINGS: After catheter placement, the tip lies within the superior cavoatrial junction. The catheter aspirates and flushes normally and is ready for immediate use. IMPRESSION: Successful ultrasound and fluoroscopic guided placement of a right brachial vein approach, 33 cm, 5 French, dual lumen PICC with tip at the superior caval-atrial junction. The PICC line is ready for immediate use. Electronically Signed   By: Jerilynn Mages.  Shick M.D.   On: 03/06/2017 12:24   Dg Chest Port 1 View  Result Date: 03/25/2017 CLINICAL DATA:  Sepsis, diabetes. EXAM: PORTABLE CHEST 1 VIEW COMPARISON:  Portable chest x-ray of March 07, 2017 FINDINGS: The lungs are adequately inflated and clear. The heart and mediastinal structures are normal. There is no pleural effusion. The bony thorax is unremarkable.  The right-sided PICC line has been removed. IMPRESSION: There is no active cardiopulmonary disease. Electronically Signed   By: David  [place] M.D.   On: 03/25/2017 07:18   Dg Chest Port 1 View  Result Date: 03/07/2017 CLINICAL DATA:  Cough. EXAM: PORTABLE CHEST 1 VIEW COMPARISON:  01/31/2017. FINDINGS: Right PICC line in stable position. Heart size normal. Mild right base subsegmental atelectasis. No focal infiltrate. No pleural effusion or pneumothorax. Thoracic spine scoliosis. IMPRESSION: 1. Right PICC line stable position. 2.  Mild right base subsegmental atelectasis. Electronically Signed   By: Marcello Moores  Register   On: 03/07/2017 06:28    Microbiology: Recent  Results (from the past 240 hour(s))  MRSA PCR Screening     Status: None   Collection Time: 03/16/17  6:07 AM  Result Value Ref Range Status   MRSA by PCR NEGATIVE NEGATIVE Final    Comment:        The GeneXpert MRSA Assay (FDA approved for NASAL specimens only), is one component of a comprehensive MRSA colonization surveillance program. It is not intended to diagnose MRSA infection nor to guide or monitor treatment for MRSA infections.   Culture, Urine     Status: Abnormal   Collection Time: 03/17/17  8:30 AM  Result Value Ref Range Status   Specimen Description URINE, RANDOM  Final   Special Requests NONE  Final   Culture MULTIPLE SPECIES PRESENT, SUGGEST RECOLLECTION (A)  Final   Report Status 03/18/2017 FINAL  Final  MRSA PCR Screening     Status: None   Collection Time: 03/25/17  4:21 AM  Result Value Ref Range Status   MRSA by PCR NEGATIVE NEGATIVE Final    Comment:        The GeneXpert MRSA Assay (FDA approved for NASAL specimens only), is one component of a comprehensive MRSA colonization surveillance program. It is not intended to diagnose MRSA infection nor to guide or monitor treatment for MRSA infections.      Labs: CBC:  Recent Labs Lab 03/18/17 1213 03/19/17 1146  WBC 7.7 8.8  NEUTROABS   --   --   HGB 8.3* 8.8*  HCT 26.5* 27.1*  MCV 71.2* 70.6*  PLT 388 505   Basic Metabolic Panel:  Recent Labs Lab 03/18/17 1213 03/19/17 0415 03/20/17 0400  NA 134* 135 137  K 4.7 4.4 4.4  CL 103 104 103  CO2 _0 GLUCOSE 224* 315* 188*  BUN _1 CREATININE 1.06* 0.95 1.07*  CALCIUM 8.8* 8.6* 8.9    BNP (last 3 results) No results for input(s): BNP in the last 8760 hours. CBG:  Recent Labs Lab 03/20/17 1220 03/20/17 1251 03/20/17 1644  GLUCAP 56* 86 275*   Time spent: 35 minutes  Signed:  Yisrael Obryan  [facility] 03/20/2017 , 8:07 AM

## 2017-03-26 LAB — GLUCOSE, CAPILLARY
GLUCOSE-CAPILLARY: 168 mg/dL — AB (ref 65–99)
GLUCOSE-CAPILLARY: 229 mg/dL — AB (ref 65–99)
GLUCOSE-CAPILLARY: 254 mg/dL — AB (ref 65–99)
GLUCOSE-CAPILLARY: 92 mg/dL (ref 65–99)
Glucose-Capillary: 104 mg/dL — ABNORMAL HIGH (ref 65–99)
Glucose-Capillary: 111 mg/dL — ABNORMAL HIGH (ref 65–99)
Glucose-Capillary: 127 mg/dL — ABNORMAL HIGH (ref 65–99)
Glucose-Capillary: 202 mg/dL — ABNORMAL HIGH (ref 65–99)

## 2017-03-26 LAB — BASIC METABOLIC PANEL
ANION GAP: 5 (ref 5–15)
Anion gap: 6 (ref 5–15)
BUN: 9 mg/dL (ref 6–20)
BUN: 7 mg/dL (ref 6–20)
CALCIUM: 8.2 mg/dL — AB (ref 8.9–10.3)
CHLORIDE: 106 mmol/L (ref 101–111)
CO2: 24 mmol/L (ref 22–32)
CO2: 25 mmol/L (ref 22–32)
CREATININE: 0.84 mg/dL (ref 0.44–1.00)
CREATININE: 0.79 mg/dL (ref 0.44–1.00)
Calcium: 8.4 mg/dL — ABNORMAL LOW (ref 8.9–10.3)
Chloride: 107 mmol/L (ref 101–111)
GFR calc Af Amer: >60 mL/min (ref >60)
GFR calc non Af Amer: >60 mL/min (ref >60)
Glucose, Bld: 116 mg/dL — ABNORMAL HIGH (ref 65–99)
Glucose, Bld: 109 mg/dL — ABNORMAL HIGH (ref 65–99)
Potassium: 3.4 mmol/L — ABNORMAL LOW (ref 3.5–5.1)
Potassium: 3.5 mmol/L (ref 3.5–5.1)
SODIUM: 136 mmol/L (ref 135–145)
Sodium: 137 mmol/L (ref 135–145)

## 2017-03-26 LAB — CBC
HCT: 27.2 % — ABNORMAL LOW (ref 36.0–46.0)
Hemoglobin: 8.1 g/dL — ABNORMAL LOW (ref 12.0–15.0)
MCH: 21.9 pg — AB (ref 26.0–34.0)
MCHC: 29.8 g/dL — ABNORMAL LOW (ref 30.0–36.0)
MCV: 73.5 fL — AB (ref 78.0–100.0)
PLATELETS: 395 10*3/uL (ref 150–400)
RBC: 3.7 MIL/uL — AB (ref 3.87–5.11)
RDW: 17.8 % — AB (ref 11.5–15.5)
WBC: 8.2 10*3/uL (ref 4.0–10.5)

## 2017-03-26 LAB — PHOSPHORUS: Phosphorus: 2.6 mg/dL (ref 2.5–4.6)

## 2017-03-26 LAB — MAGNESIUM: Magnesium: 1.7 mg/dL (ref 1.7–2.4)

## 2017-03-26 MED ORDER — ORAL CARE MOUTH RINSE
15.0000 mL | Freq: Two times a day (BID) | OROMUCOSAL | Status: DC
Start: 2017-03-26 — End: 2017-04-04
  Administered 2017-03-27 – 2017-04-03 (×12): 15 mL via OROMUCOSAL

## 2017-03-26 MED ORDER — CHLORHEXIDINE GLUCONATE 0.12 % MT SOLN
15.0000 mL | Freq: Two times a day (BID) | OROMUCOSAL | Status: DC
  Administered 2017-03-26 – 2017-04-03 (×15): 15 mL via OROMUCOSAL
  Filled 2017-03-26 (×15): qty 15

## 2017-03-26 MED ORDER — DEXTROSE 5 % IV SOLN
1.0000 g | Freq: Three times a day (TID) | INTRAVENOUS | Status: DC
  Administered 2017-03-26 – 2017-03-28 (×7): 1 g via INTRAVENOUS
  Filled 2017-03-26 (×9): qty 1

## 2017-03-26 MED ORDER — ONDANSETRON HCL 4 MG/2ML IJ SOLN
4.0000 mg | INTRAMUSCULAR | Status: DC | PRN
  Administered 2017-03-26 – 2017-03-27 (×2): 4 mg via INTRAVENOUS
  Filled 2017-03-26 (×2): qty 2

## 2017-03-26 MED ORDER — LORAZEPAM 2 MG/ML IJ SOLN
1.0000 mg | INTRAMUSCULAR | Status: DC | PRN
  Administered 2017-03-26 – 2017-03-30 (×5): 1 mg via INTRAVENOUS
  Filled 2017-03-26 (×5): qty 1

## 2017-03-26 NOTE — Progress Notes (Signed)
PROGRESS NOTE    [name]  FXO:[id number] DOB: Jun 10, 1966 DOA: 03/24/2017 PCP: [name], MD   Brief Narrative: 51 YEAR old lady with prior h/o IDDM, multiple admissions for DKA, gastroparesis comes in for nausea, and vomiting for a few days. She was admitted to [facility] service for possible serotonin syndrome.   Assessment & Plan:   Active Problems:   Serotonin syndrome   Tardive dyskinesia  Acute dystonia:  Not much improvement. Currently on cogentin. If no improvement by am, please increase the dose and watch for improvement.     Anxiety and depression.  Was on Buspirone and fluoxetine,  Currently on ativan. Will increase the dose to 1 mg every 4 hours.     SIRS? Suspected sepsis:  So far no source of infection. Empirically on IV antibiotics, follow urine and blood cultures.  No fever and normal wbc counts.   IDDM:  CBG (last 3)   Recent Labs  03/26/17 0413 03/26/17 0741 03/26/17 1218  GLUCAP 92 104* 254*    On IV fluids and NPO .   Urinary retention and acute kidney injury: Possibly from dehydration. Foley and AKI improved.    DKA:  RESOLVED.  Currently on levemir and SSI.    Hypertension:  As she is npo, will be on IV hydralazine prn.   Nausea and vomiting:  Improved.  Resume zofran.  Maintain NPO for now.    DVT prophylaxis: heparin sq Code Status: full code.  Family Communication: none at bedside.  Disposition Plan: pending resolution of nausea and vomiting.    Consultants:   Pccm.    Procedures: none.   Antimicrobials: vancomycin and cefepime.   Subjective: Reports feeling anxious, wants anxiety medication.   Objective: Vitals:   03/26/17 0830 03/26/17 0900 03/26/17 1017 03/26/17 1219  BP: (!) 156/73 (!) 127/52 (!) 157/84 (!) 156/138  Pulse: 99 93 91 96  Resp: (!) 23 18 (!) 22 16  Temp:   98.4 F (36.9 C) 98.1 F (36.7 C)  TempSrc:   Oral Oral  SpO2: 100% 99% 100% 100%  Weight:   59.4 kg (131 lb)   Height:         Intake/Output Summary (Last 24 hours) at 03/26/17 1231 Last data filed at 03/26/17 0800  Gross per 24 hour  Intake          2224.79 ml  Output              560 ml  Net          1664.79 ml   Filed Weights   03/25/17 0436 03/26/17 0600 03/26/17 1017  Weight: 61.1 kg (134 lb 11.2 oz) 58.4 kg (128 lb 12 oz) 59.4 kg (131 lb)    Examination:  General exam: Appears restless and anxious.  Respiratory system: Clear to auscultation. Respiratory effort normal. Cardiovascular system: S1 & S2 heard, RRR. No JVD, murmurs, rubs, gallops or clicks. No pedal edema. Gastrointestinal system: Abdomen is nondistended, soft and nontender. No organomegaly or masses felt. Normal bowel sounds heard. Central nervous system: Alert and able to move all extremities but rigidity presents in all extremities.  Extremities: Symmetric 5 x 5 power. Skin: No rashes, lesions or ulcers Psychiatry: anxious.     Data Reviewed: I have personally reviewed following labs and imaging studies  CBC:  Recent Labs Lab 03/24/17 2335 03/25/17 0623 03/26/17 0408  WBC 6.7 10.3 8.2  NEUTROABS 5.2  --   --   HGB 9.5* 10.8* 8.1*  HCT 31.0*  35.6* 27.2*  MCV 72.8* 73.4* 73.5*  PLT 467* 375 094   Basic Metabolic Panel:  Recent Labs Lab 03/25/17 1149 03/25/17 1611 03/25/17 1955 03/26/17 0116 03/26/17 0408  NA 138 137 136 136 137  K 4.4 3.5 3.5 3.4* 3.5  CL 105 105 104 107 106  CO2 21* 23 18* 24 25  GLUCOSE 192* 95 179* 116* 109*  BUN _0 CREATININE 0.82 0.80 0.92 0.84 0.79  CALCIUM 8.8* 8.9 8.8* 8.2* 8.4*  MG  --   --   --   --  1.7  PHOS  --   --   --   --  2.6   GFR: Estimated Creatinine Clearance: 70.4 mL/min (by C-G formula based on SCr of 0.79 mg/dL). Liver Function Tests:  Recent Labs Lab 03/24/17 2335  AST 32  ALT 24  ALKPHOS 91  BILITOT 1.0  PROT 8.2*  ALBUMIN 4.2   No results for input(s): LIPASE, AMYLASE in the last 168 hours. No results for input(s): AMMONIA in the last 168  hours. Coagulation Profile: No results for input(s): INR, PROTIME in the last 168 hours. Cardiac Enzymes:  Recent Labs Lab 03/24/17 2335  CKTOTAL 82  TROPONINI <0.03   BNP (last 3 results) No results for input(s): PROBNP in the last 8760 hours. HbA1C: No results for input(s): HGBA1C in the last 72 hours. CBG:  Recent Labs Lab 03/25/17 2205 03/25/17 2315 03/26/17 0003 03/26/17 0413 03/26/17 0741  GLUCAP 146* 127* 111* 92 104*   Lipid Profile: No results for input(s): CHOL, HDL, LDLCALC, TRIG, CHOLHDL, LDLDIRECT in the last 72 hours. Thyroid Function Tests:  Recent Labs  03/25/17 1149  TSH 0.632   Anemia Panel: No results for input(s): VITAMINB12, FOLATE, FERRITIN, TIBC, IRON, RETICCTPCT in the last 72 hours. Sepsis Labs:  Recent Labs Lab 03/24/17 2356 03/25/17 0623 03/25/17 0739 03/25/17 1117  PROCALCITON  --   --  0.10  --   LATICACIDVEN 3.63* 2.1*  --  2.0*    Recent Results (from the past 240 hour(s))  Culture, Urine     Status: Abnormal   Collection Time: 03/17/17  8:30 AM  Result Value Ref Range Status   Specimen Description URINE, RANDOM  Final   Special Requests NONE  Final   Culture MULTIPLE SPECIES PRESENT, SUGGEST RECOLLECTION (A)  Final   Report Status 03/18/2017 FINAL  Final  MRSA PCR Screening     Status: None   Collection Time: 03/25/17  4:21 AM  Result Value Ref Range Status   MRSA by PCR NEGATIVE NEGATIVE Final    Comment:        The GeneXpert MRSA Assay (FDA approved for NASAL specimens only), is one component of a comprehensive MRSA colonization surveillance program. It is not intended to diagnose MRSA infection nor to guide or monitor treatment for MRSA infections.   Culture, blood (routine x 2)     Status: None (Preliminary result)   Collection Time: 03/25/17 11:17 AM  Result Value Ref Range Status   Specimen Description BLOOD LEFT WRIST  Final   Special Requests IN PEDIATRIC BOTTLE Blood Culture adequate volume  Final    Culture PENDING  Incomplete   Report Status PENDING  Incomplete         Radiology Studies: Ir Fluoro Guide Cv Line Right  Result Date: 03/25/2017 INDICATION: Diabetes, DKA, access requested for IV therapy and hydration. Poor venous access. EXAM: ULTRASOUND AND FLUOROSCOPIC GUIDED PICC LINE INSERTION MEDICATIONS: 1% lidocaine  CONTRAST:  None FLUOROSCOPY TIME:  Thirty seconds (5 mGy) COMPLICATIONS: None immediate. TECHNIQUE: The procedure, risks, benefits, and alternatives were explained to the patient and informed written consent was obtained. A timeout was performed prior to the initiation of the procedure. The right upper extremity was prepped with chlorhexidine in a sterile fashion, and a sterile drape was applied covering the operative field. Maximum barrier sterile technique with sterile gowns and gloves were used for the procedure. A timeout was performed prior to the initiation of the procedure. Local anesthesia was provided with 1% lidocaine. Under direct ultrasound guidance, the right brachial vein was accessed with a micropuncture kit after the overlying soft tissues were anesthetized with 1% lidocaine. An ultrasound image was saved for documentation purposes. A guidewire was advanced to the level of the superior caval-atrial junction for measurement purposes and the PICC line was cut to length. A peel-away sheath was placed and a 34 cm, 5 [place], dual lumen was inserted to level of the superior caval-atrial junction. A post procedure spot fluoroscopic was obtained. The catheter easily aspirated and flushed and was sutured in place. A dressing was placed. The patient tolerated the procedure well without immediate post procedural complication. FINDINGS: After catheter placement, the tip lies within the superior cavoatrial junction. The catheter aspirates and flushes normally and is ready for immediate use. IMPRESSION: Successful ultrasound and fluoroscopic guided placement of a right brachial vein  approach, 34 cm, 5 French, dual lumen PICC with tip at the superior caval-atrial junction. The PICC line is ready for immediate use. Read by: Saverio Danker, PA-C Electronically Signed   By: Marybelle Killings M.D.   On: 03/25/2017 15:44   Ir US Guide Vasc Access Right  Result Date: 03/25/2017 INDICATION: Diabetes, DKA, access requested for IV therapy and hydration. Poor venous access. EXAM: ULTRASOUND AND FLUOROSCOPIC GUIDED PICC LINE INSERTION MEDICATIONS: 1% lidocaine CONTRAST:  None FLUOROSCOPY TIME:  Thirty seconds (5 mGy) COMPLICATIONS: None immediate. TECHNIQUE: The procedure, risks, benefits, and alternatives were explained to the patient and informed written consent was obtained. A timeout was performed prior to the initiation of the procedure. The right upper extremity was prepped with chlorhexidine in a sterile fashion, and a sterile drape was applied covering the operative field. Maximum barrier sterile technique with sterile gowns and gloves were used for the procedure. A timeout was performed prior to the initiation of the procedure. Local anesthesia was provided with 1% lidocaine. Under direct ultrasound guidance, the right brachial vein was accessed with a micropuncture kit after the overlying soft tissues were anesthetized with 1% lidocaine. An ultrasound image was saved for documentation purposes. A guidewire was advanced to the level of the superior caval-atrial junction for measurement purposes and the PICC line was cut to length. A peel-away sheath was placed and a 34 cm, 5 [place], dual lumen was inserted to level of the superior caval-atrial junction. A post procedure spot fluoroscopic was obtained. The catheter easily aspirated and flushed and was sutured in place. A dressing was placed. The patient tolerated the procedure well without immediate post procedural complication. FINDINGS: After catheter placement, the tip lies within the superior cavoatrial junction. The catheter aspirates and  flushes normally and is ready for immediate use. IMPRESSION: Successful ultrasound and fluoroscopic guided placement of a right brachial vein approach, 34 cm, 5 French, dual lumen PICC with tip at the superior caval-atrial junction. The PICC line is ready for immediate use. Read by: Saverio Danker, PA-C Electronically Signed   By: Marybelle Killings  M.D.   On: 03/25/2017 15:44   Dg Chest Port 1 View  Result Date: 03/25/2017 CLINICAL DATA:  Sepsis, diabetes. EXAM: PORTABLE CHEST 1 VIEW COMPARISON:  Portable chest x-ray of March 07, 2017 FINDINGS: The lungs are adequately inflated and clear. The heart and mediastinal structures are normal. There is no pleural effusion. The bony thorax is unremarkable. The right-sided PICC line has been removed. IMPRESSION: There is no active cardiopulmonary disease. Electronically Signed   By: David  Martinique M.D.   On: 03/25/2017 07:18   Dg Abd Portable 1v  Result Date: 03/25/2017 CLINICAL DATA:  NG tube placement. EXAM: PORTABLE ABDOMEN - 1 VIEW COMPARISON:  02/05/2017 FINDINGS: An enteric tube overlies the stomach with tip and side hole well beyond the GE junction. A moderate amount of stool is again seen throughout the colon. No dilated loops of bowel are seen to suggest obstruction. Cholecystectomy clips are noted. No acute osseous abnormality is seen. IMPRESSION: Enteric tube in the stomach. Electronically Signed   By: Logan Bores M.D.   On: 03/25/2017 10:27        Scheduled Meds: . benztropine mesylate  1 mg Intravenous BID  . chlorhexidine  15 mL Mouth Rinse BID  . heparin  5,000 Units Subcutaneous Q8H  . insulin aspart  0-9 Units Subcutaneous Q4H  . insulin detemir  12 Units Subcutaneous QHS  . mouth rinse  15 mL Mouth Rinse q12n4p  . pantoprazole (PROTONIX) IV  40 mg Intravenous Q24H   Continuous Infusions: . sodium chloride Stopped (03/25/17 2300)  . sodium chloride Stopped (03/25/17 0830)  . ceFEPime (MAXIPIME) IV    . dextrose 5 % and 0.45 % NaCl  with KCl 40 mEq/L 75 mL/hr at 03/26/17 0800  . potassium chloride    . vancomycin 500 mg (03/26/17 1035)     LOS: 1 day    Time spent: 60 min    Whitney Bingaman, MD [facility] Pager [phone]   If 7PM-7AM, please contact night-coverage [email or web address] Password [email or web address] 03/26/2017, 12:31 PM

## 2017-03-26 NOTE — Plan of Care (Signed)
Problem: Activity: Goal: Risk for activity intolerance will decrease Outcome: Progressing Encourage patient to get out of bed daily.  Patienit able to get out of bed with assistance of PT and gait belt to bedside commode with out anxiety attack.

## 2017-03-26 NOTE — Progress Notes (Addendum)
Message sent Dr Karleen Hampshire about standing order for (2) runs of Potassium with 0400 Potassium 3.4 Dr Karleen Hampshire returned call immediately and decided to wait for her assessment of patient, all orders, and results.

## 2017-03-26 NOTE — Progress Notes (Signed)
Pharmacy Antibiotic Note  [name] is a 51 y.o. female admitted on 03/24/2017 with sepsis. She was admitted for nausea, vomiting and acute dystonia.  Pharmacy has been consulted for vancomycin and aztreonam dosing. Lactic acid was elevated on presentation and is now trending down. Pt is afebrile, WBC wnl, and blood cultures were drawn.   Plan: Continue vancomycin 500mg  IV Q12h Stop aztreonam, start cefepime 1g IV Q8h Monitor clinical picture, renal function, VT prn F/U C&S, abx deescalation / LOT  May be able to monitor off abx soon if no source found  Height: 5\' 1"  (154.9 cm) Weight: 131 lb (59.4 kg) IBW/kg (Calculated) : 47.8  Temp (24hrs), Avg:98.1 F (36.7 C), Min:96.6 F (35.9 C), Max:98.8 F (37.1 C)   Recent Labs Lab 03/19/17 1146  03/24/17 2335 03/24/17 2356 03/25/17 0623 03/25/17 1117 03/25/17 1149 03/25/17 1611 03/25/17 1955 03/26/17 0116 03/26/17 0408  WBC 8.8  --  6.7  --  10.3  --   --   --   --   --  8.2  CREATININE  --   < > 0.95  --  0.93  --  0.82 0.80 0.92 0.84 0.79  LATICACIDVEN  --   --   --  3.63* 2.1* 2.0*  --   --   --   --   --   < > = values in this interval not displayed.  Estimated Creatinine Clearance: 70.4 mL/min (by C-G formula based on SCr of 0.79 mg/dL).    Allergies  Allergen Reactions  . Anesthetics, Amide Nausea And Vomiting  . Penicillins Diarrhea and Nausea And Vomiting    Has patient had a PCN reaction causing immediate rash, facial/tongue/throat swelling, SOB or lightheadedness with hypotension: No Has patient had a PCN reaction causing severe rash involving mucus membranes or skin necrosis: No Has patient had a PCN reaction that required hospitalization No Has patient had a PCN reaction occurring within the last 10 years: Yes  If all of the above answers are "NO", then may proceed with Cephalosporin use.   . Buprenorphine Hcl Rash  . Encainide Nausea And Vomiting    Elenor Quinones, PharmD, [facility] Clinical  Pharmacist Pager [phone] 03/26/2017 10:31 AM

## 2017-03-26 NOTE — Evaluation (Signed)
Physical Therapy Evaluation Patient Details Name: [name] MRN: [id number] DOB: 1966/04/14 Today's Date: 03/26/2017   History of Present Illness  51 yr old female with PMHx Anxiety, Depression, Peripheral Neuropathy,  IDDM and Gastroparesis frequent visits to ER for nausea and vomitting presents from [facility] recently treated for DKA.  Some adjustments were made in her psychotropic meds ( buspirone, lyrica, fluoxetine)  now presenting to their ED with rigidity, spasms, cogwheeling.  No fever but elevated lactate.  Physician was concerned for serotonin syndrome patient was transferred based on Poison control recs.   Clinical Impression  Pt admitted with above diagnosis. Pt currently with functional limitations due to the deficits listed below (see PT Problem List). Pt evaluation was limited by pt nausea and pain, full assessment of ROM and strength was not possible. Pt is mod A for bed mobility, and transfer to and from [facility]. Pt reports that her mobility has decreased markedly in last month. Pt will benefit from skilled PT to increase their independence and safety with mobility to allow discharge to the venue listed below.       Follow Up Recommendations SNF    Equipment Recommendations  Other (comment) (to be determined at next venue)       Precautions / Restrictions Precautions Precautions: None Restrictions Weight Bearing Restrictions: No      Mobility  Bed Mobility Overal bed mobility: Needs Assistance Bed Mobility: Supine to Sit     Supine to sit: Mod assist     General bed mobility comments: modA for trunk to upright and pad scoot to EoB  Transfers Overall transfer level: Needs assistance Equipment used: 1 person hand held assist Transfers: Sit to/from [facility] Sit to Stand: Mod assist Stand pivot transfers: Mod assist       General transfer comment: modA for power up and blocking of bilateral knees, able to stand pivot to and from  [facility]  Ambulation/Gait             General Gait Details: unable at this time          Balance Overall balance assessment: Needs assistance Sitting-balance support: Feet supported;No upper extremity supported Sitting balance-Leahy Scale: Poor     Standing balance support: Bilateral upper extremity supported Standing balance-Leahy Scale: Zero                               Pertinent Vitals/Pain Pain Assessment: 0-10 Pain Score: 9  Pain Location: stomach Pain Descriptors / Indicators: Cramping;Crushing Pain Intervention(s): Monitored during session;Patient requesting pain meds-RN notified;RN gave pain meds during session;Limited activity within patient's tolerance    Home Living Family/patient expects to be discharged to:: Private residence Living Arrangements: Alone   Type of Home: Apartment Home Access: Stairs to enter Entrance Stairs-Rails: Right Entrance Stairs-Number of Steps: [place]: One level Home Equipment: Walker - 2 wheels      Prior Function Level of Independence: Independent with assistive device(s)         Comments: for the past month she has been having difficulty getting up the steps and using a RW for ambulation      Hand Dominance        Extremity/Trunk Assessment   Upper Extremity Assessment Upper Extremity Assessment: RUE deficits/detail;Difficult to assess due to impaired cognition;LUE deficits/detail RUE Deficits / Details: ROM limited, decorticate posturing, strength grossly 2/5 RUE: Unable to fully assess due to pain LUE Deficits / Details: ROM limited,  strength grossly 2/5 LUE: Unable to fully assess due to pain    Lower Extremity Assessment Lower Extremity Assessment: Difficult to assess due to impaired cognition;Generalized weakness (unable to formally assess)       Communication   Communication: Other (comment) (very slow and limited with responses)  Cognition Arousal/Alertness: Lethargic Behavior  During Therapy: Flat affect Overall Cognitive Status: Difficult to assess                                        General Comments General comments (skin integrity, edema, etc.): at entry BP 171/83, HR 95bpm, RR 30, SaO2 on RA 100%O2, pt with anxiety at PT questions HR to 120bpm , RR to 45, vc for deep, slow breathing for 2 minutes, HR 100 bpm, RR 30, after transfer from BSC, BP 174/84, HR 100 bpm, RR 36         Assessment/Plan    PT Assessment Patient needs continued PT services  PT Problem List Decreased strength;Decreased range of motion;Decreased activity tolerance;Decreased balance;Decreased mobility;Decreased cognition;Decreased safety awareness;Pain       PT Treatment Interventions DME instruction;Gait training;Stair training;Functional mobility training;Therapeutic activities;Therapeutic exercise;Balance training;Neuromuscular re-education;Cognitive remediation;Patient/family education    PT Goals (Current goals can be found in the Care Plan section)  Acute Rehab PT Goals Patient Stated Goal: none stated PT Goal Formulation: Patient unable to participate in goal setting Time For Goal Achievement: 04/09/17 Potential to Achieve Goals: Fair    Frequency Min 3X/week   Barriers to discharge Inaccessible home environment         AM-PAC PT "6 Clicks" Daily Activity  Outcome Measure Difficulty turning over in bed (including adjusting bedclothes, sheets and blankets)?: Unable Difficulty moving from lying on back to sitting on the side of the bed? : Unable Difficulty sitting down on and standing up from a chair with arms (e.g., wheelchair, bedside commode, etc,.)?: Unable Help needed moving to and from a bed to chair (including a wheelchair)?: Total Help needed walking in hospital room?: Total Help needed climbing 3-5 steps with a railing? : Total 6 Click Score: 6    End of Session Equipment Utilized During Treatment: Gait belt Activity Tolerance: Other  (comment) (treatment limited by nausea and pain) Patient left: in bed;with call bell/phone within reach;with nursing/sitter in room Nurse Communication: Mobility status PT Visit Diagnosis: Unsteadiness on feet (R26.81);Other abnormalities of gait and mobility (R26.89);Muscle weakness (generalized) (M62.81);Difficulty in walking, not elsewhere classified (R26.2);Other symptoms and signs involving the nervous system (R29.898);Pain Pain - part of body:  (stomach)    Time: 1093-2355 PT Time Calculation (min) (ACUTE ONLY): 31 min   Charges:   PT Evaluation $PT Eval Moderate Complexity: 1 Mod PT Treatments $Therapeutic Activity: 8-22 mins   PT G Codes:        Alem Fahl B. Migdalia Dk PT, DPT Acute Rehabilitation  [phone] Pager [phone]    [place] 03/26/2017, 4:13 PM

## 2017-03-26 NOTE — Hospital Discharge Follow-Up (Signed)
The patient's appointment with the TCC at [facility] has been rescheduled to 04/05/17 @ 1400 and the information has been placed on the AVS. Her appointment with Dr Darleene Cleaver has been rescheduled to 05/07/17 @ 1400.

## 2017-03-27 ENCOUNTER — Inpatient Hospital Stay: Payer: Self-pay | Admitting: Family Medicine

## 2017-03-27 DIAGNOSIS — G259 Extrapyramidal and movement disorder, unspecified: Secondary | ICD-10-CM

## 2017-03-27 LAB — GLUCOSE, CAPILLARY
GLUCOSE-CAPILLARY: 100 mg/dL — AB (ref 65–99)
GLUCOSE-CAPILLARY: 195 mg/dL — AB (ref 65–99)
GLUCOSE-CAPILLARY: 171 mg/dL — AB (ref 65–99)
Glucose-Capillary: 86 mg/dL (ref 65–99)
Glucose-Capillary: 235 mg/dL — ABNORMAL HIGH (ref 65–99)
Glucose-Capillary: 194 mg/dL — ABNORMAL HIGH (ref 65–99)

## 2017-03-27 MED ORDER — DIPHENHYDRAMINE HCL 50 MG/ML IJ SOLN
12.5000 mg | Freq: Four times a day (QID) | INTRAMUSCULAR | Status: DC
  Administered 2017-03-27 – 2017-03-31 (×15): 12.5 mg via INTRAVENOUS
  Filled 2017-03-27 (×14): qty 1

## 2017-03-27 MED ORDER — MORPHINE SULFATE (PF) 4 MG/ML IV SOLN
1.0000 mg | INTRAVENOUS | Status: DC | PRN
  Administered 2017-03-27 – 2017-03-28 (×7): 2 mg via INTRAVENOUS
  Administered 2017-03-29: 1 mg via INTRAVENOUS
  Administered 2017-03-29 – 2017-04-02 (×6): 2 mg via INTRAVENOUS
  Filled 2017-03-27 (×14): qty 1

## 2017-03-27 MED ORDER — ONDANSETRON HCL 4 MG/2ML IJ SOLN
4.0000 mg | Freq: Four times a day (QID) | INTRAMUSCULAR | Status: DC
  Administered 2017-03-27 – 2017-03-29 (×9): 4 mg via INTRAVENOUS
  Filled 2017-03-27 (×9): qty 2

## 2017-03-27 MED ORDER — DIPHENHYDRAMINE HCL 50 MG/ML IJ SOLN: 12.5000 mg | Freq: Three times a day (TID) | INTRAMUSCULAR | Status: DC | PRN

## 2017-03-27 NOTE — Consult Note (Addendum)
NEURO HOSPITALIST CONSULT NOTE   Requestig physician: Dr. Cruzita Lederer   Reason for Consult: Adverse reaction to medictions  History obtained from:  Patient   And chart  HPI:                                                                                                                                          [name] is an 51 y.o. female with PMHx Anxiety, Depression, Peripheral Neuropathy,  IDDM and Gastroparesis frequent visits to ER for nausea and vomitting presents from [facility] recently treated for DKA.  Some adjustments were made in her psychotropic meds ( buspirone, lyrica, fluoxetine)  now presented to their ED with rigidity, spasms, cogwheeling. She had some improvement with 1 mg cogentin given at outside care facility.   Patient does have a standing when necessary medication for Benadryl injection 12.5 mg every 8 hours if needed.  Thus far she has not received any.  It does look as though patient has received multiple injections of Ativan.  Currently patient is laying in her bed appears uncomfortable, states her major problem was nausea. She is spitting into a emesis bucket but is showing no emesis. Patient states that the Cogentin seems to appear to be helping.She does not have any active dystonic reactions in front of me.  Past Medical History:  Diagnosis Date  . Depression   . Diabetes mellitus without complication ([place])   . Essential hypertension   . Gastroparesis   . GERD (gastroesophageal reflux disease)   . HLD (hyperlipidemia)     Past Surgical History:  Procedure Laterality Date  . COLONOSCOPY  09/27/2014   at [facility]  . ESOPHAGOGASTRODUODENOSCOPY  09/27/2014   at [facility], Dr Rolan Lipa. biospy neg for celiac, neg for H pylori.   . EYE SURGERY    . gailstones    . IR FLUORO GUIDE CV LINE RIGHT  02/01/2017  . IR FLUORO GUIDE CV LINE RIGHT  03/06/2017  . IR FLUORO GUIDE CV LINE RIGHT  03/25/2017  . IR GENERIC HISTORICAL  01/24/2016   IR FLUORO GUIDE  CV LINE RIGHT 01/24/2016 Darrell K Allred, PA-C [facility]INTER[facility]  . IR GENERIC HISTORICAL  01/24/2016   IR US GUIDE VASC ACCESS RIGHT 01/24/2016 Darrell K Allred, PA-C [facility]  . IR US GUIDE VASC ACCESS RIGHT  02/01/2017  . IR US GUIDE VASC ACCESS RIGHT  03/06/2017  . IR US GUIDE VASC ACCESS RIGHT  03/25/2017  . POSTERIOR VITRECTOMY AND MEMBRANE PEEL-LEFT EYE  09/28/2002  . POSTERIOR VITRECTOMY AND MEMBRANE PEEL-RIGHT EYE  03/16/2002  . RETINAL DETACHMENT SURGERY      Family History  Problem Relation Age of Onset  . Cystic fibrosis Mother   . Hypertension Father   . Diabetes Brother   . Hypertension  Maternal Grandmother    Social History:  reports that she has never smoked. She has never used smokeless tobacco. She reports that she does not drink alcohol or use drugs.  Allergies  Allergen Reactions  . Anesthetics, Amide Nausea And Vomiting  . Penicillins Diarrhea and Nausea And Vomiting    Has patient had a PCN reaction causing immediate rash, facial/tongue/throat swelling, SOB or lightheadedness with hypotension: No Has patient had a PCN reaction causing severe rash involving mucus membranes or skin necrosis: No Has patient had a PCN reaction that required hospitalization No Has patient had a PCN reaction occurring within the last 10 years: Yes  If all of the above answers are "NO", then may proceed with Cephalosporin use.   . Buprenorphine Hcl Rash  . Encainide Nausea And Vomiting    MEDICATIONS:                                                                                                                     Prior to Admission:  Prescriptions Prior to Admission  Medication Sig Dispense Refill Last Dose  . insulin aspart (NOVOLOG) 100 UNIT/ML injection Inject 0-12 Units into the skin 3 (three) times daily with meals. Per sliding scale 50 mL 3 03/15/2017 at Unknown time  . insulin detemir (LEVEMIR) 100 UNIT/ML injection Inject 0.16 mLs (16 Units total) into the skin 2 (two) times  daily. 30 mL 0 03/15/2017 at Unknown time  . lisinopril (PRINIVIL,ZESTRIL) 5 MG tablet Take 1 tablet (5 mg total) by mouth daily. 30 tablet 0 03/15/2017 at Unknown time  . albuterol (PROVENTIL HFA;VENTOLIN HFA) 108 (90 Base) MCG/ACT inhaler Inhale 2 puffs into the lungs every 6 (six) hours as needed for wheezing or shortness of breath. (Patient not taking: Reported on 03/25/2017) 1 Inhaler 0 Not Taking at Unknown time  . atorvastatin (LIPITOR) 40 MG tablet Take 1 tablet (40 mg total) by mouth daily. (Patient not taking: Reported on 03/25/2017) 30 tablet 3 Not Taking at Unknown time  . Blood Glucose Monitoring Suppl (ACCU-CHEK AVIVA) device Use as instructed 3 times daily before meals 1 each 0 UNK  . busPIRone (BUSPAR) 10 MG tablet Take 1 tablet (10 mg total) by mouth 2 (two) times daily. (Patient not taking: Reported on 03/25/2017) 60 tablet 3 Not Taking at Unknown time  . famotidine (PEPCID) 20 MG tablet Take 1 tablet (20 mg total) by mouth 2 (two) times daily. (Patient not taking: Reported on 03/25/2017) 60 tablet 0 Not Taking at Unknown time  . ferrous sulfate 325 (65 FE) MG tablet Take 1 tablet (325 mg total) by mouth daily with breakfast. (Patient not taking: Reported on 03/25/2017) 30 tablet 0 Not Taking at Unknown time  . FLUoxetine (PROZAC) 20 MG tablet Take 2 tablets (40 mg total) by mouth daily. (Patient not taking: Reported on 03/25/2017) 60 tablet 3 Not Taking at Unknown time  . furosemide (LASIX) 40 MG tablet Take 40 mg by mouth daily as needed for fluid.    Not Taking  at Unknown time  . glucose blood (ACCU-CHEK AVIVA) test strip Use as instructed 3 times daily before meals 100 each 12 Taking  . Insulin Syringe-Needle U-100 (BD INSULIN SYRINGE ULTRAFINE) 31G X 15/64" 0.5 ML MISC 1 each by Does not apply route 4 (four) times daily as needed. 120 each 5 Taking  . Lancet Devices (ACCU-CHEK SOFTCLIX) lancets Use as instructed 3 times daily before meals 1 each 5 Taking  . LORazepam (ATIVAN) 1 MG tablet  Take 1 tablet (1 mg total) by mouth every 8 (eight) hours as needed for anxiety. (Patient not taking: Reported on 01/30/2017) 8 tablet 0 Not Taking at Unknown time  . metoCLOPramide (REGLAN) 5 MG/5ML solution Take 10 mLs (10 mg total) by mouth 4 (four) times daily -  before meals and at bedtime. (Patient not taking: Reported on 03/25/2017) 473 mL 0 Not Taking at Unknown time  . ondansetron (ZOFRAN ODT) 4 MG disintegrating tablet Take 1 tablet (4 mg total) by mouth every 8 (eight) hours as needed for nausea or vomiting. (Patient not taking: Reported on 03/25/2017) 60 tablet 2 Not Taking at Unknown time  . polyethylene glycol (MIRALAX / GLYCOLAX) packet Take 17 g by mouth daily as needed for mild constipation. (Patient not taking: Reported on 03/25/2017) 100 each 0 Not Taking at Unknown time  . pregabalin (LYRICA) 150 MG capsule Take 1 capsule (150 mg total) by mouth 2 (two) times daily. (Patient not taking: Reported on 03/25/2017) 60 capsule 3 Not Taking at Unknown time  . vitamin B-12 (CYANOCOBALAMIN) 1000 MCG tablet Take 1 tablet (1,000 mcg total) by mouth daily. (Patient not taking: Reported on 03/25/2017)   Not Taking at Unknown time   Scheduled: . benztropine mesylate  1 mg Intravenous BID  . chlorhexidine  15 mL Mouth Rinse BID  . heparin  5,000 Units Subcutaneous Q8H  . insulin aspart  0-9 Units Subcutaneous Q4H  . insulin detemir  12 Units Subcutaneous QHS  . mouth rinse  15 mL Mouth Rinse q12n4p  . ondansetron (ZOFRAN) IV  4 mg Intravenous Q6H  . pantoprazole (PROTONIX) IV  40 mg Intravenous Q24H   Continuous: . sodium chloride Stopped (03/25/17 2300)  . sodium chloride Stopped (03/25/17 0830)  . ceFEPime (MAXIPIME) IV Stopped (03/27/17 0408)  . dextrose 5 % and 0.45 % NaCl with KCl 40 mEq/L 75 mL/hr at 03/27/17 0555  . potassium chloride    . vancomycin Stopped (03/26/17 2218)   RWE:[id number], lidocaine (PF), LORazepam, morphine injection, potassium chloride  ROS:                                                                                                                                        History obtained from the patient General ROS: negative for - chills, fatigue, fever, night sweats, weight gain or weight loss Psychological ROS: negative for - behavioral disorder, hallucinations, memory difficulties, mood swings or suicidal  ideation Ophthalmic ROS: negative for - blurry vision, double vision, eye pain or loss of vision ENT ROS: negative for - epistaxis, nasal discharge, oral lesions, sore throat, tinnitus or vertigo Allergy and Immunology ROS: negative for - hives or itchy/watery eyes Hematological and Lymphatic ROS: negative for - bleeding problems, bruising or swollen lymph nodes Endocrine ROS: negative for - galactorrhea, hair pattern changes, polydipsia/polyuria or temperature intolerance Respiratory ROS: negative for - cough, hemoptysis, shortness of breath or wheezing Cardiovascular ROS: negative for - chest pain, dyspnea on exertion, edema or irregular heartbeat Gastrointestinal ROS: positive for - abdominal pain, nausea/vomiting  Genito-Urinary ROS: negative for - dysuria, hematuria, incontinence or urinary frequency/urgency Musculoskeletal ROS: negative for - joint swelling or muscular weakness Neurological ROS: as noted in HPI Dermatological ROS: negative for rash and skin lesion changes  Blood pressure 126/79, pulse (!) 117, temperature 98.2 F (36.8 C), temperature source Oral, resp. rate 14, height _0  (1.549 m), weight 61.9 kg (136 lb 6.4 oz), last menstrual period 03/24/2017, SpO2 99 %. Neurologic Examination:                                                                                                      HEENT-  Normocephalic, no lesions, without obvious abnormality.  Normal external eye and conjunctiva.  Normal TM's bilaterally.  Normal auditory canals and external ears. Normal external nose, mucus membranes and septum.  Normal  pharynx. Cardiovascular- S1, S2 normal, pulses palpable throughout   Lungs- chest clear, no wheezing, rales, normal symmetric air entry Abdomen- normal findings: bowel sounds normal Extremities- no edema Lymph-no adenopathy palpable Musculoskeletal-no joint tenderness, deformity or swelling Skin-warm and dry, no hyperpigmentation, vitiligo, or suspicious lesions Neurological Examination Mental Status: Alert, oriented, thought content appropriate.  Speech fluent talks with a very low voice that she appears to be in discomfort from the nauseawithout evidence of aphasia.  Able to follow 3 step commands without difficulty. Cranial Nerves: II:  Visual fields grossly normal,  III,IV, VI: ptosis not present, extra-ocular motions intact bilaterally pupils equal, round, reactive to light and accommodation V,VII: smile symmetric, facial light touch sensation normal bilaterally VIII: hearing normal bilaterally IX,X: uvula rises symmetrically XI: bilateral shoulder shrug XII: midline tongue extension Motor: 4/5 throughout moving all extremities well. Increased tone vs paratonia Sensory: Pinprick and light touch intact throughout, bilaterally Deep Tendon Reflexes: 2+ and symmetric throughout Plantars: Right: downgoing   Left: downgoing Cerebellar: normal finger-to-nose, and normal heel-to-shin test Gait: not tested  Lab Results: Basic Metabolic Panel:  Recent Labs Lab 03/25/17 1149 03/25/17 1611 03/25/17 1955 03/26/17 0116 03/26/17 0408  NA 138 137 136 136 137  K 4.4 3.5 3.5 3.4* 3.5  CL 105 105 104 107 106  CO2 21* 23 18* 24 25  GLUCOSE 192* 95 179* 116* 109*  BUN _1 CREATININE 0.82 0.80 0.92 0.84 0.79  CALCIUM 8.8* 8.9 8.8* 8.2* 8.4*  MG  --   --   --   --  1.7  PHOS  --   --   --   --  2.6   Liver Function Tests:  Recent Labs Lab 03/24/17 2335  AST 32  ALT 24  ALKPHOS 91  BILITOT 1.0  PROT 8.2*  ALBUMIN 4.2   CBC:  Recent Labs Lab 03/24/17 2335  03/25/17 0623 03/26/17 0408  WBC 6.7 10.3 8.2  NEUTROABS 5.2  --   --   HGB 9.5* 10.8* 8.1*  HCT 31.0* 35.6* 27.2*  MCV 72.8* 73.4* 73.5*  PLT 467* 375 395    Cardiac Enzymes:  Recent Labs Lab 03/24/17 2335  CKTOTAL 82  TROPONINI <0.03   CBG:  Recent Labs Lab 03/26/17 1623 03/26/17 1914 03/26/17 2336 03/27/17 0341 03/27/17 0722  GLUCAP 229* 202* 168* 100* 47    Microbiology: Results for orders placed or performed during the hospital encounter of 03/24/17  MRSA PCR Screening     Status: None   Collection Time: 03/25/17  4:21 AM  Result Value Ref Range Status   MRSA by PCR NEGATIVE NEGATIVE Final    Comment:        The GeneXpert MRSA Assay (FDA approved for NASAL specimens only), is one component of a comprehensive MRSA colonization surveillance program. It is not intended to diagnose MRSA infection nor to guide or monitor treatment for MRSA infections.   Culture, blood (routine x 2)     Status: None (Preliminary result)   Collection Time: 03/25/17 11:17 AM  Result Value Ref Range Status   Specimen Description BLOOD LEFT WRIST  Final   Special Requests IN PEDIATRIC BOTTLE Blood Culture adequate volume  Final   Culture NO GROWTH 1 DAY  Final   Report Status PENDING  Incomplete  Culture, blood (routine x 2)     Status: None (Preliminary result)   Collection Time: 03/25/17 11:49 AM  Result Value Ref Range Status   Specimen Description BLOOD LEFT HAND  Final   Special Requests IN PEDIATRIC BOTTLE Blood Culture adequate volume  Final   Culture NO GROWTH 1 DAY  Final   Report Status PENDING  Incomplete   Imaging: Ir Fluoro Guide Cv Line Right  Result Date: 03/25/2017 INDICATION: Diabetes, DKA, access requested for IV therapy and hydration. Poor venous access. EXAM: ULTRASOUND AND FLUOROSCOPIC GUIDED PICC LINE INSERTION MEDICATIONS: 1% lidocaine CONTRAST:  None FLUOROSCOPY TIME:  Thirty seconds (5 mGy) COMPLICATIONS: None immediate. TECHNIQUE: The procedure,  risks, benefits, and alternatives were explained to the patient and informed written consent was obtained. A timeout was performed prior to the initiation of the procedure. The right upper extremity was prepped with chlorhexidine in a sterile fashion, and a sterile drape was applied covering the operative field. Maximum barrier sterile technique with sterile gowns and gloves were used for the procedure. A timeout was performed prior to the initiation of the procedure. Local anesthesia was provided with 1% lidocaine. Under direct ultrasound guidance, the right brachial vein was accessed with a micropuncture kit after the overlying soft tissues were anesthetized with 1% lidocaine. An ultrasound image was saved for documentation purposes. A guidewire was advanced to the level of the superior caval-atrial junction for measurement purposes and the PICC line was cut to length. A peel-away sheath was placed and a 34 cm, 5 [place], dual lumen was inserted to level of the superior caval-atrial junction. A post procedure spot fluoroscopic was obtained. The catheter easily aspirated and flushed and was sutured in place. A dressing was placed. The patient tolerated the procedure well without immediate post procedural complication. FINDINGS: After catheter placement, the tip lies within the superior  cavoatrial junction. The catheter aspirates and flushes normally and is ready for immediate use. IMPRESSION: Successful ultrasound and fluoroscopic guided placement of a right brachial vein approach, 34 cm, 5 French, dual lumen PICC with tip at the superior caval-atrial junction. The PICC line is ready for immediate use. Read by: Saverio Danker, PA-C Electronically Signed   By: Marybelle Killings M.D.   On: 03/25/2017 15:44   Ir US Guide Vasc Access Right  Result Date: 03/25/2017 INDICATION: Diabetes, DKA, access requested for IV therapy and hydration. Poor venous access. EXAM: ULTRASOUND AND FLUOROSCOPIC GUIDED PICC LINE INSERTION  MEDICATIONS: 1% lidocaine CONTRAST:  None FLUOROSCOPY TIME:  Thirty seconds (5 mGy) COMPLICATIONS: None immediate. TECHNIQUE: The procedure, risks, benefits, and alternatives were explained to the patient and informed written consent was obtained. A timeout was performed prior to the initiation of the procedure. The right upper extremity was prepped with chlorhexidine in a sterile fashion, and a sterile drape was applied covering the operative field. Maximum barrier sterile technique with sterile gowns and gloves were used for the procedure. A timeout was performed prior to the initiation of the procedure. Local anesthesia was provided with 1% lidocaine. Under direct ultrasound guidance, the right brachial vein was accessed with a micropuncture kit after the overlying soft tissues were anesthetized with 1% lidocaine. An ultrasound image was saved for documentation purposes. A guidewire was advanced to the level of the superior caval-atrial junction for measurement purposes and the PICC line was cut to length. A peel-away sheath was placed and a 34 cm, 5 [place], dual lumen was inserted to level of the superior caval-atrial junction. A post procedure spot fluoroscopic was obtained. The catheter easily aspirated and flushed and was sutured in place. A dressing was placed. The patient tolerated the procedure well without immediate post procedural complication. FINDINGS: After catheter placement, the tip lies within the superior cavoatrial junction. The catheter aspirates and flushes normally and is ready for immediate use. IMPRESSION: Successful ultrasound and fluoroscopic guided placement of a right brachial vein approach, 34 cm, 5 French, dual lumen PICC with tip at the superior caval-atrial junction. The PICC line is ready for immediate use. Read by: Saverio Danker, PA-C Electronically Signed   By: Marybelle Killings M.D.   On: 03/25/2017 15:44   Dg Abd Portable 1v  Result Date: 03/25/2017 CLINICAL DATA:  NG tube  placement. EXAM: PORTABLE ABDOMEN - 1 VIEW COMPARISON:  02/05/2017 FINDINGS: An enteric tube overlies the stomach with tip and side hole well beyond the GE junction. A moderate amount of stool is again seen throughout the colon. No dilated loops of bowel are seen to suggest obstruction. Cholecystectomy clips are noted. No acute osseous abnormality is seen. IMPRESSION: Enteric tube in the stomach. Electronically Signed   By: Logan Bores M.D.   On: 03/25/2017 10:27   Assessment and plan per attending neurologist  Etta Quill PA-C [facility] [phone]  03/27/2017, 8:58 AM  Attending addendum Patient seen and examined this morning. Agree with H&P documented above.  Assessment/Plan: I am not fully certain that all her symptoms are due to Reglan as they seem to have a functional overlay as well. Description of the episode of movements was more suggestive of writhing movements that lasted a few minutes and subsided spontaneously.  They have not recurred, and had resolved prior to receiving Benadryl. She does have increased tone in her muscles, which could be a possible adverse reaction of Reglan.  Impression Possible adverse drug reaction with Reglan - extrapyramidal  symptoms Possible behavioral disturbance in the setting of acute illness  Recommend: C/W Cogentin 42m BID. Can consider increasing to 2 mg BID in the future. Benadryl 230mBID for 1-2 days and assess response. Can try Diazepam (possibly unavailable at pharmacy due to shortage in supply) Consider Psychiatry consultation for opinion on antidepressants etc once more stable and able to tolerate PO. Please call usKoreaith questions. We will be available as needed. Plan d/w Dr. GhRenne Crigleria phone.  -- AsAmie PortlandMD [facility] [phone] 7pm to 7am, please call on call as listed on AMION.

## 2017-03-27 NOTE — Progress Notes (Signed)
PROGRESS NOTE  [name] RFV:[id number] DOB: [birth year] DOA: 03/24/2017 PCP: [name], MD   LOS: 2 days   Brief Narrative / Interim history: 51 YEAR old lady with prior h/o IDDM, multiple admissions for DKA, gastroparesis comes in for nausea, and vomiting for a few days. She was admitted to [facility] service for possible serotonin syndrome.   Assessment & Plan: Active Problems:   Serotonin syndrome   Tardive dyskinesia   Acute dystonic reaction -Patient ongoing nausea as well as generalized abdominal pain make neuro exam somewhat difficult, however she appears to be still rigid on exam, consulted neurology, appreciate input -Add Benadryl  Intractable nausea vomiting -Due to gastroparesis, last time she was hospitalized she was discharged on Reglan which may explain #1, however she has been on Reglan in the past.  It does not appear that she had serotonin syndrome -Scheduled Zofran for now -Has an NG tube in place, I am not clear who or when that was placed, minimal output, will attempt to remove today  Anxiety / depression -We will resume buspirone and fluoxetine when she is able to take p.o  SIRS -No apparent source of infection, discontinue vancomycin, will discontinue cefepime tomorrow if cultures remain negative  Insulin-dependent diabetes mellitus with recurrent DKA's -? Non compliance, DKA has now resolved and CBGs under control right now  HTN -hold Lisinopril until able to have consistent po intake   DVT prophylaxis: heparin Code Status: Full code Family Communication: no family at bedside Disposition Plan: home when ready   Consultants:   Neurology   Procedures:   None   Antimicrobials:  Vancomycin  Cefepime    Subjective: -rocking back and forth, states that she is in pain  Objective: Vitals:   03/27/17 0500 03/27/17 0600 03/27/17 0720 03/27/17 1120  BP: 112/64 126/79  (!) 166/77  Pulse: 86 91 (!) 117   Resp: _0 Temp:        TempSrc:   Oral   SpO2: 100% 99%    Weight: 61.9 kg (136 lb 6.4 oz)     Height:        Intake/Output Summary (Last 24 hours) at 03/27/17 1304 Last data filed at 03/26/17 1919  Gross per 24 hour  Intake            992.5 ml  Output              950 ml  Net             42.5 ml   Filed Weights   03/26/17 0600 03/26/17 1017 03/27/17 0500  Weight: 58.4 kg (128 lb 12 oz) 59.4 kg (131 lb) 61.9 kg (136 lb 6.4 oz)    Examination:  Constitutional: rocking back and forth Eyes:  lids and conjunctivae normal Respiratory: clear to auscultation bilaterally, no wheezing, no crackles.  Cardiovascular: Regular rate and rhythm, no murmurs / rubs / gallops. No LE edema.  Abdomen: no tenderness. Bowel sounds positive.  Skin: no rashes, lesions, ulcers. No induration Neurologic: difficulties speaking, strength equal, poor participation in neuro exam, cannot evaluate clonus due to rigidity    Data Reviewed: I have independently reviewed following labs and imaging studies   CBC:  Recent Labs Lab 03/24/17 2335 03/25/17 0623 03/26/17 0408  WBC 6.7 10.3 8.2  NEUTROABS 5.2  --   --   HGB 9.5* 10.8* 8.1*  HCT 31.0* 35.6* 27.2*  MCV 72.8* 73.4* 73.5*  PLT 467* 375 403   Basic Metabolic Panel:  Recent Labs Lab 03/25/17 1149 03/25/17 1611 03/25/17 1955 03/26/17 0116 03/26/17 0408  NA 138 137 136 136 137  K 4.4 3.5 3.5 3.4* 3.5  CL 105 105 104 107 106  CO2 21* 23 18* 24 25  GLUCOSE 192* 95 179* 116* 109*  BUN _0 CREATININE 0.82 0.80 0.92 0.84 0.79  CALCIUM 8.8* 8.9 8.8* 8.2* 8.4*  MG  --   --   --   --  1.7  PHOS  --   --   --   --  2.6   GFR: Estimated Creatinine Clearance: 71.7 mL/min (by C-G formula based on SCr of 0.79 mg/dL). Liver Function Tests:  Recent Labs Lab 03/24/17 2335  AST 32  ALT 24  ALKPHOS 91  BILITOT 1.0  PROT 8.2*  ALBUMIN 4.2   No results for input(s): LIPASE, AMYLASE in the last 168 hours. No results for input(s): AMMONIA in the last 168  hours. Coagulation Profile: No results for input(s): INR, PROTIME in the last 168 hours. Cardiac Enzymes:  Recent Labs Lab 03/24/17 2335  CKTOTAL 82  TROPONINI <0.03   BNP (last 3 results) No results for input(s): PROBNP in the last 8760 hours. HbA1C: No results for input(s): HGBA1C in the last 72 hours. CBG:  Recent Labs Lab 03/26/17 1914 03/26/17 2336 03/27/17 0341 03/27/17 0722 03/27/17 1122  GLUCAP 202* 168* 100* 86 195*   Lipid Profile: No results for input(s): CHOL, HDL, LDLCALC, TRIG, CHOLHDL, LDLDIRECT in the last 72 hours. Thyroid Function Tests:  Recent Labs  03/25/17 1149  TSH 0.632   Anemia Panel: No results for input(s): VITAMINB12, FOLATE, FERRITIN, TIBC, IRON, RETICCTPCT in the last 72 hours. Urine analysis:    Component Value Date/Time   COLORURINE YELLOW 03/24/2017 [place] 03/24/2017 2359   LABSPEC 1.020 03/24/2017 2359   PHURINE 7.0 03/24/2017 2359   GLUCOSEU >=500 (A) 03/24/2017 2359   HGBUR TRACE (A) 03/24/2017 2359   BILIRUBINUR NEGATIVE 03/24/2017 2359   BILIRUBINUR neg 01/17/2017 1533   KETONESUR 15 (A) 03/24/2017 2359   PROTEINUR 100 (A) 03/24/2017 2359   UROBILINOGEN 0.2 01/17/2017 1533   UROBILINOGEN 0.2 04/27/2015 1608   NITRITE NEGATIVE 03/24/2017 2359   LEUKOCYTESUR NEGATIVE 03/24/2017 2359   Sepsis Labs: Invalid input(s): PROCALCITONIN, LACTICIDVEN  Recent Results (from the past 240 hour(s))  MRSA PCR Screening     Status: None   Collection Time: 03/25/17  4:21 AM  Result Value Ref Range Status   MRSA by PCR NEGATIVE NEGATIVE Final    Comment:        The GeneXpert MRSA Assay (FDA approved for NASAL specimens only), is one component of a comprehensive MRSA colonization surveillance program. It is not intended to diagnose MRSA infection nor to guide or monitor treatment for MRSA infections.   Culture, blood (routine x 2)     Status: None (Preliminary result)   Collection Time: 03/25/17 11:17 AM    Result Value Ref Range Status   Specimen Description BLOOD LEFT WRIST  Final   Special Requests IN PEDIATRIC BOTTLE Blood Culture adequate volume  Final   Culture NO GROWTH 1 DAY  Final   Report Status PENDING  Incomplete  Culture, blood (routine x 2)     Status: None (Preliminary result)   Collection Time: 03/25/17 11:49 AM  Result Value Ref Range Status   Specimen Description BLOOD LEFT HAND  Final   Special Requests IN PEDIATRIC BOTTLE Blood Culture adequate volume  Final   Culture NO GROWTH 1 DAY  Final   Report Status PENDING  Incomplete      Radiology Studies: Ir Fluoro Guide Cv Line Right  Result Date: 03/25/2017 INDICATION: Diabetes, DKA, access requested for IV therapy and hydration. Poor venous access. EXAM: ULTRASOUND AND FLUOROSCOPIC GUIDED PICC LINE INSERTION MEDICATIONS: 1% lidocaine CONTRAST:  None FLUOROSCOPY TIME:  Thirty seconds (5 mGy) COMPLICATIONS: None immediate. TECHNIQUE: The procedure, risks, benefits, and alternatives were explained to the patient and informed written consent was obtained. A timeout was performed prior to the initiation of the procedure. The right upper extremity was prepped with chlorhexidine in a sterile fashion, and a sterile drape was applied covering the operative field. Maximum barrier sterile technique with sterile gowns and gloves were used for the procedure. A timeout was performed prior to the initiation of the procedure. Local anesthesia was provided with 1% lidocaine. Under direct ultrasound guidance, the right brachial vein was accessed with a micropuncture kit after the overlying soft tissues were anesthetized with 1% lidocaine. An ultrasound image was saved for documentation purposes. A guidewire was advanced to the level of the superior caval-atrial junction for measurement purposes and the PICC line was cut to length. A peel-away sheath was placed and a 34 cm, 5 [place], dual lumen was inserted to level of the superior caval-atrial  junction. A post procedure spot fluoroscopic was obtained. The catheter easily aspirated and flushed and was sutured in place. A dressing was placed. The patient tolerated the procedure well without immediate post procedural complication. FINDINGS: After catheter placement, the tip lies within the superior cavoatrial junction. The catheter aspirates and flushes normally and is ready for immediate use. IMPRESSION: Successful ultrasound and fluoroscopic guided placement of a right brachial vein approach, 34 cm, 5 French, dual lumen PICC with tip at the superior caval-atrial junction. The PICC line is ready for immediate use. Read by: Saverio Danker, PA-C Electronically Signed   By: Marybelle Killings M.D.   On: 03/25/2017 15:44   Ir US Guide Vasc Access Right  Result Date: 03/25/2017 INDICATION: Diabetes, DKA, access requested for IV therapy and hydration. Poor venous access. EXAM: ULTRASOUND AND FLUOROSCOPIC GUIDED PICC LINE INSERTION MEDICATIONS: 1% lidocaine CONTRAST:  None FLUOROSCOPY TIME:  Thirty seconds (5 mGy) COMPLICATIONS: None immediate. TECHNIQUE: The procedure, risks, benefits, and alternatives were explained to the patient and informed written consent was obtained. A timeout was performed prior to the initiation of the procedure. The right upper extremity was prepped with chlorhexidine in a sterile fashion, and a sterile drape was applied covering the operative field. Maximum barrier sterile technique with sterile gowns and gloves were used for the procedure. A timeout was performed prior to the initiation of the procedure. Local anesthesia was provided with 1% lidocaine. Under direct ultrasound guidance, the right brachial vein was accessed with a micropuncture kit after the overlying soft tissues were anesthetized with 1% lidocaine. An ultrasound image was saved for documentation purposes. A guidewire was advanced to the level of the superior caval-atrial junction for measurement purposes and the PICC  line was cut to length. A peel-away sheath was placed and a 34 cm, 5 [place], dual lumen was inserted to level of the superior caval-atrial junction. A post procedure spot fluoroscopic was obtained. The catheter easily aspirated and flushed and was sutured in place. A dressing was placed. The patient tolerated the procedure well without immediate post procedural complication. FINDINGS: After catheter placement, the tip lies within the superior cavoatrial junction. The catheter  aspirates and flushes normally and is ready for immediate use. IMPRESSION: Successful ultrasound and fluoroscopic guided placement of a right brachial vein approach, 34 cm, 5 French, dual lumen PICC with tip at the superior caval-atrial junction. The PICC line is ready for immediate use. Read by: Saverio Danker, PA-C Electronically Signed   By: Marybelle Killings M.D.   On: 03/25/2017 15:44     Scheduled Meds: . benztropine mesylate  1 mg Intravenous BID  . chlorhexidine  15 mL Mouth Rinse BID  . diphenhydrAMINE  12.5 mg Intravenous Q6H  . heparin  5,000 Units Subcutaneous Q8H  . insulin aspart  0-9 Units Subcutaneous Q4H  . insulin detemir  12 Units Subcutaneous QHS  . mouth rinse  15 mL Mouth Rinse q12n4p  . ondansetron (ZOFRAN) IV  4 mg Intravenous Q6H  . pantoprazole (PROTONIX) IV  40 mg Intravenous Q24H   Continuous Infusions: . sodium chloride Stopped (03/25/17 2300)  . sodium chloride Stopped (03/25/17 0830)  . ceFEPime (MAXIPIME) IV 1 g (03/27/17 1254)  . dextrose 5 % and 0.45 % NaCl with KCl 40 mEq/L 75 mL/hr at 03/27/17 0555  . potassium chloride    . vancomycin Stopped (03/26/17 2218)    Marzetta Board, MD, PhD [facility] Pager [phone]  If 7PM-7AM, please contact night-coverage [email or web address] Password [email or web address] 03/27/2017, 1:04 PM

## 2017-03-27 NOTE — Progress Notes (Signed)
PT Cancellation Note  Patient Details Name: [name] MRN: [id number] DOB: [birth year]   Cancelled Treatment:    Reason Eval/Treat Not Completed: Pain limiting ability to participate;Other (comment) (nausea).  Pt refusing OOB mobility reporting abdominal pain and nausea (RN made aware).  Per RN she has been asleep most of the afternoon and has not moved today.  PT to check back tomorrow.  Thanks,    Barbarann Ehlers. [name], PT, DPT [phone]   03/27/2017, 5:51 PM

## 2017-03-28 DIAGNOSIS — E1143 Type 2 diabetes mellitus with diabetic autonomic (poly)neuropathy: Secondary | ICD-10-CM

## 2017-03-28 LAB — BASIC METABOLIC PANEL
ANION GAP: 7 (ref 5–15)
Anion gap: 7 (ref 5–15)
BUN: <5 mg/dL — ABNORMAL LOW (ref 6–20)
CALCIUM: 8.3 mg/dL — AB (ref 8.9–10.3)
CALCIUM: 8.7 mg/dL — AB (ref 8.9–10.3)
CHLORIDE: 100 mmol/L — AB (ref 101–111)
CO2: 22 mmol/L (ref 22–32)
CO2: 24 mmol/L (ref 22–32)
CREATININE: 0.82 mg/dL (ref 0.44–1.00)
CREATININE: 0.92 mg/dL (ref 0.44–1.00)
Chloride: 100 mmol/L — ABNORMAL LOW (ref 101–111)
Glucose, Bld: 387 mg/dL — ABNORMAL HIGH (ref 65–99)
Glucose, Bld: 176 mg/dL — ABNORMAL HIGH (ref 65–99)
Potassium: 6.1 mmol/L — ABNORMAL HIGH (ref 3.5–5.1)
Potassium: 4.3 mmol/L (ref 3.5–5.1)
SODIUM: 129 mmol/L — AB (ref 135–145)
SODIUM: 131 mmol/L — AB (ref 135–145)

## 2017-03-28 LAB — GLUCOSE, CAPILLARY
GLUCOSE-CAPILLARY: 216 mg/dL — AB (ref 65–99)
GLUCOSE-CAPILLARY: 193 mg/dL — AB (ref 65–99)
GLUCOSE-CAPILLARY: 206 mg/dL — AB (ref 65–99)
GLUCOSE-CAPILLARY: 169 mg/dL — AB (ref 65–99)
GLUCOSE-CAPILLARY: 206 mg/dL — AB (ref 65–99)
Glucose-Capillary: 173 mg/dL — ABNORMAL HIGH (ref 65–99)
Glucose-Capillary: 138 mg/dL — ABNORMAL HIGH (ref 65–99)

## 2017-03-28 NOTE — Consult Note (Signed)
Referring Provider: [facility] Primary Care Physician:  [name], MD Primary Gastroenterologist: Dr. Scherrie November, [facility]  Reason for Consultation:  Nausea and vomiting    Attending physician's note   I have taken a history, examined the patient and reviewed the chart. I agree with the Advanced Practitioner's note, impression and recommendations.  Severe gastroparesis leading to nausea and vomiting.  Suspected Reglan side effects.  If Reglan cannot be used then medication options are limited.  Erythromycin is an option if QT and other medications allow.  Zofran RTC and Ativan q4h prn are reasonable options for now. Trial of Ativan RTC if N/V persist. Optimal mgmt of DM is very important.  She is advised to have follow up GI care with Dr. Derrill Kay at [facility]. Mestinon, gastric pacemaker and other options can be explored with return to Dr. Derrill Kay at [facility] following discharge.   Lucio Edward, MD Marval Regal [phone] Mon-Fri 8a-5p [phone] after 5p, weekends, holidays   ASSESSMENT AND PLAN:    51 yo female with severe documented gastroparesis. Numerous ED visits and admissions for DKA, nausea and vomiting. Followed in 2016 by motility specialist, Dr. Derrill Kay, at [facility]. Gastric pacer recommended but apparently insurance wouldn't pay for one  -Admitted now with dystonia after recent adjustment of psychotropic meds and / or Reglan. Neurology evaluated, feels extrapyramidal symptoms possibly combination of Reglan and behavioral disturbances. Recommended Psychiatry consult, trial of Valium and a possible increase in Cogentin dose. She is getting Zofran every 6 hours.  -If dystonia felt to be secondary to Reglan then treatment options are limited. I don't see that she is getting Erythromycin. IV is preferable but apparently not available at this time. She could take it PO before meals though it can prolong QT interval and is already at risk for that with psychotropic medications.  -Mestinon has been  used successfully in some patients but this would need to be under direction of [facility].  -Another option is that she now has Medicaid. She had commercial insurance when declined for gastric pacer. Maybe medicaid would pay for the Enterra pacer? She needs to reestablish care with Dr. Derrill Kay    HPI: [name] is a 51 y.o. female with DMI , diabetic gastroparesis, mixed gastric dysthymia, severe depression.  She has had numerous ED visits / admission for DKA, nausea and vomiting. Normal EGD in 2010 for nausea and vomiting. In 2016 she was followed at [facility] by Dr. Derrill Kay who specializes in motility. A 4 hours gastric emptying study confirmed severe gastroparesis. She was not compliant with recommendations and follow up. Insurance apparently wouldn't pay for gastric pacer recommended by Dr. Derrill Kay  Patient was hospitalized 9/21 - 9/26 with DKA / nausea and vomiting. Apparently some of her psychotropic medications were adjusted. She came back to ED 9/30 with vomiting but also acute dystonia with muscle rigidity. She was diaphoretic. Lactate was lactate.Treated empirically with antibiotics, no source of infection founds. Initially there was concern for serotonin syndrome but this seems to have been ruled out. Dystonia improved with Cogentin.   [name] doesn't offer much history. She didn't think Reglan caused her any problems. She has nausea and vomiting "about half the time" at home. She takes Metamucil for constipation when not vomiting   Past Medical History:  Diagnosis Date  . Depression   . Diabetes mellitus without complication ([place])   . Essential hypertension   . Gastroparesis   . GERD (gastroesophageal reflux disease)   . HLD (hyperlipidemia)  Past Surgical History:  Procedure Laterality Date  . COLONOSCOPY  09/27/2014   at [facility]  . ESOPHAGOGASTRODUODENOSCOPY  09/27/2014   at [facility], Dr Rolan Lipa. biospy neg for celiac, neg for H pylori.   . EYE SURGERY    . gailstones    .  IR FLUORO GUIDE CV LINE RIGHT  02/01/2017  . IR FLUORO GUIDE CV LINE RIGHT  03/06/2017  . IR FLUORO GUIDE CV LINE RIGHT  03/25/2017  . IR GENERIC HISTORICAL  01/24/2016   IR FLUORO GUIDE CV LINE RIGHT 01/24/2016 Darrell K Allred, PA-C [facility]INTER[facility]  . IR GENERIC HISTORICAL  01/24/2016   IR US GUIDE VASC ACCESS RIGHT 01/24/2016 Darrell K Allred, PA-C [facility]  . IR US GUIDE VASC ACCESS RIGHT  02/01/2017  . IR US GUIDE VASC ACCESS RIGHT  03/06/2017  . IR US GUIDE VASC ACCESS RIGHT  03/25/2017  . POSTERIOR VITRECTOMY AND MEMBRANE PEEL-LEFT EYE  09/28/2002  . POSTERIOR VITRECTOMY AND MEMBRANE PEEL-RIGHT EYE  03/16/2002  . RETINAL DETACHMENT SURGERY      Prior to Admission medications   Medication Sig Start Date End Date Taking? Authorizing Provider  insulin aspart (NOVOLOG) 100 UNIT/ML injection Inject 0-12 Units into the skin 3 (three) times daily with meals. Per sliding scale 10/30/16  Yes Amao, [name], MD  insulin detemir (LEVEMIR) 100 UNIT/ML injection Inject 0.16 mLs (16 Units total) into the skin 2 (two) times daily. 02/03/17  Yes Eugenie Filler, MD  lisinopril (PRINIVIL,ZESTRIL) 5 MG tablet Take 1 tablet (5 mg total) by mouth daily. 03/08/17  Yes Charlynne Cousins, MD  albuterol (PROVENTIL HFA;VENTOLIN HFA) 108 (90 Base) MCG/ACT inhaler Inhale 2 puffs into the lungs every 6 (six) hours as needed for wheezing or shortness of breath. Patient not taking: Reported on 03/25/2017 10/20/15   [name], MD  atorvastatin (LIPITOR) 40 MG tablet Take 1 tablet (40 mg total) by mouth daily. Patient not taking: Reported on 03/25/2017 10/30/16   [name], MD  Blood Glucose Monitoring Suppl (ACCU-CHEK AVIVA) device Use as instructed 3 times daily before meals 10/30/16   [name], MD  busPIRone (BUSPAR) 10 MG tablet Take 1 tablet (10 mg total) by mouth 2 (two) times daily. Patient not taking: Reported on 03/25/2017 10/30/16   [name], MD  famotidine (PEPCID) 20 MG tablet Take 1 tablet (20 mg total) by  mouth 2 (two) times daily. Patient not taking: Reported on 03/25/2017 09/29/16   Patrecia Pour, Christean Grief, MD  ferrous sulfate 325 (65 FE) MG tablet Take 1 tablet (325 mg total) by mouth daily with breakfast. Patient not taking: Reported on 03/25/2017 03/27/16   Donne Hazel, MD  FLUoxetine (PROZAC) 20 MG tablet Take 2 tablets (40 mg total) by mouth daily. Patient not taking: Reported on 03/25/2017 10/30/16   [name], MD  furosemide (LASIX) 40 MG tablet Take 40 mg by mouth daily as needed for fluid.     [provider]  glucose blood (ACCU-CHEK AVIVA) test strip Use as instructed 3 times daily before meals 10/30/16   [name], MD  Insulin Syringe-Needle U-100 (BD INSULIN SYRINGE ULTRAFINE) 31G X 15/64" 0.5 ML MISC 1 each by Does not apply route 4 (four) times daily as needed. 08/16/15   [name], MD  Lancet Devices [facility]) lancets Use as instructed 3 times daily before meals 10/30/16   [name], MD  LORazepam (ATIVAN) 1 MG tablet Take 1 tablet (1 mg total) by mouth every 8 (eight) hours  as needed for anxiety. Patient not taking: Reported on 01/30/2017 01/09/17   Varney Biles, MD  metoCLOPramide (REGLAN) 5 MG/5ML solution Take 10 mLs (10 mg total) by mouth 4 (four) times daily -  before meals and at bedtime. Patient not taking: Reported on 03/25/2017 03/20/17   Lavina Hamman, MD  ondansetron (ZOFRAN ODT) 4 MG disintegrating tablet Take 1 tablet (4 mg total) by mouth every 8 (eight) hours as needed for nausea or vomiting. Patient not taking: Reported on 03/25/2017 09/04/16   [name], MD  polyethylene glycol (MIRALAX / GLYCOLAX) packet Take 17 g by mouth daily as needed for mild constipation. Patient not taking: Reported on 03/25/2017 09/04/16   [name], MD  pregabalin (LYRICA) 150 MG capsule Take 1 capsule (150 mg total) by mouth 2 (two) times daily. Patient not taking: Reported on 03/25/2017 01/17/17   Argentina Donovan, PA-C  vitamin B-12 (CYANOCOBALAMIN) 1000  MCG tablet Take 1 tablet (1,000 mcg total) by mouth daily. Patient not taking: Reported on 03/25/2017 02/03/17   Eugenie Filler, MD    Current Facility-Administered Medications  Medication Dose Route Frequency Provider Last Rate Last Dose  . 0.9 %  sodium chloride infusion   Intravenous Continuous Palumbo, April, MD   Stopped at 03/25/17 2300  . 0.9 %  sodium chloride infusion   Intravenous Continuous Scatliffe, Rise Paganini, MD   Stopped at 03/25/17 0830  . benztropine mesylate (COGENTIN) injection 1 mg  1 mg Intravenous BID Scatliffe, Cyril Mourning D, MD   1 mg at 03/28/17 0900  . ceFEPIme (MAXIPIME) 1 g in dextrose 5 % 50 mL IVPB  1 g Intravenous Q8H Reginia Naas, RPH   Stopped at 03/28/17 0406  . chlorhexidine (PERIDEX) 0.12 % solution 15 mL  15 mL Mouth Rinse BID Rush Farmer, MD   15 mL at 03/28/17 0900  . dextrose 5 % and 0.45 % NaCl with KCl 40 mEq/L infusion   Intravenous Continuous Juanito Doom, MD 75 mL/hr at 03/28/17 0556    . diphenhydrAMINE (BENADRYL) injection 12.5 mg  12.5 mg Intravenous Q6H Marliss Coots, PA-C   12.5 mg at 03/28/17 0900  . heparin injection 5,000 Units  5,000 Units Subcutaneous Q8H Scatliffe, Rise Paganini, MD   5,000 Units at 03/28/17 0554  . insulin aspart (novoLOG) injection 0-9 Units  0-9 Units Subcutaneous Q4H Sampson Goon, MD   3 Units at 03/28/17 [phone]  . insulin detemir (LEVEMIR) injection 12 Units  12 Units Subcutaneous QHS Sampson Goon, MD   12 Units at 03/27/17 2259  . lidocaine (PF) (XYLOCAINE) 1 % injection   Other PRN Saverio Danker, PA-C   10 mL at 03/25/17 1506  . LORazepam (ATIVAN) injection 1 mg  1 mg Intravenous Q4H PRN Hosie Poisson, MD   1 mg at 03/27/17 0646  . MEDLINE mouth rinse  15 mL Mouth Rinse q12n4p Rush Farmer, MD   15 mL at 03/27/17 1600  . morphine 4 MG/ML injection 1-2 mg  1-2 mg Intravenous Q4H PRN Caren Griffins, MD   2 mg at 03/28/17 0859  . ondansetron (ZOFRAN) injection 4 mg  4 mg Intravenous Q6H Caren Griffins, MD   4 mg at 03/28/17 0554  . pantoprazole (PROTONIX) injection 40 mg  40 mg Intravenous Q24H Scatliffe, Kristen D, MD   40 mg at 03/28/17 0900  . potassium chloride 10 mEq in 100 mL IVPB  10 mEq Intravenous Q1H PRN Scatliffe, Rise Paganini,  MD        Allergies as of 03/24/2017 - Review Complete 03/24/2017  Allergen Reaction Noted  . Anesthetics, amide Nausea And Vomiting 03/21/2012  . Penicillins Diarrhea and Nausea And Vomiting 10/20/2014  . Buprenorphine hcl Rash 02/08/2015  . Encainide Nausea And Vomiting 02/08/2015    Family History  Problem Relation Age of Onset  . Cystic fibrosis Mother   . Hypertension Father   . Diabetes Brother   . Hypertension Maternal Grandmother     Social History   Social History  . Marital status: Single    Spouse name: N/A  . Number of children: N/A  . Years of education: N/A   Occupational History  . Not on file.   Social History Main Topics  . Smoking status: Never Smoker  . Smokeless tobacco: Never Used  . Alcohol use No  . Drug use: No  . Sexual activity: Not Currently   Other Topics Concern  . Not on file   Social History Narrative  . No narrative on file    Review of Systems: All systems reviewed and negative except where noted in HPI.  Physical Exam: Vital signs in last 24 hours: Temp:  [97.8 F (36.6 C)-98.5 F (36.9 C)] 98.1 F (36.7 C) (10/04 1159) Pulse Rate:  [95-99] 96 (10/04 0300) Resp:  [15-33] 15 (10/04 0300) BP: (123-175)/(63-81) 123/63 (10/04 1159) SpO2:  [99 %-100 %] 100 % (10/04 0300) Weight:  [125 lb 14.4 oz (57.1 kg)] 125 lb 14.4 oz (57.1 kg) (10/04 0300) Last BM Date: 03/25/17 General:   Alert, well-developed, black female gently rocking back in forth in bed and periodically spitting up phlegm in emesis basin. Speaks in low voice.  Psych:  Pleasant, cooperative. Normal mood and affect. Eyes:  Pupils equal, sclera clear, no icterus.   Conjunctiva pink. Ears:  Normal auditory acuity. Nose:  No  deformity, discharge,  or lesions. Neck:  Supple; no masses Lungs:  Clear throughout to auscultation.   No wheezes, crackles, or rhonchi.  Heart:  Regular rate and rhythm; no murmurs, no edema Abdomen:  Soft, non-distended, nontender, BS active, no palp mass    Rectal:  Deferred  Msk:  Symmetrical without gross deformities. . Pulses:  Normal pulses noted. Neurologic:  Alert and  oriented x4;  grossly normal neurologically. Skin:  Intact without significant lesions or rashes..   Intake/Output from previous day: 10/03 0701 - 10/04 0700 In: 2851.3 [I.V.:2601.3; IV Piggyback:250] Out: 2400 [Urine:2400] Intake/Output this shift: Total I/O In: -  Out: 400 [Urine:400]  Lab Results:  Recent Labs  03/26/17 0408  WBC 8.2  HGB 8.1*  HCT 27.2*  PLT 395   BMET  Recent Labs  03/26/17 0408 03/28/17 0402 03/28/17 0600  NA 137 129* 131*  K 3.5 6.1* 4.3  CL 106 100* 100*  CO2 25 22 24   GLUCOSE 109* 387* 176*  BUN 7 <5* <5*  CREATININE 0.79 0.82 0.92  CALCIUM 8.4* 8.3* 8.7*    Studies/Results: No results found.   Tye Savoy, NP-C @  03/28/2017, 12:15 PM  Pager number [phone]

## 2017-03-28 NOTE — Progress Notes (Signed)
Physical Therapy Treatment Patient Details Name: [name] MRN: [id number] DOB: 1966-05-26 Today's Date: 03/28/2017    History of Present Illness 51 yr old female with PMHx Anxiety, Depression, Peripheral Neuropathy,  IDDM and Gastroparesis frequent visits to ER for nausea and vomitting presents from [facility] recently treated for DKA.  Some adjustments were made in her psychotropic meds. Now presenting to their ED with rigidity, spasms, cogwheeling.  No fever but elevated lactate.  Physician was concerned for serotonin syndrome. Neurology consulted and thought possible adverse drug reaction with Reglan - extrapyramidal symptoms  and possible behavioral disturbance in the setting of acute illness    PT Comments    Pt requiring 2 person assist for basic mobility and currently unable to ambulate. Continue to recommend SNF. Pt with very flat affect and minimally communicative. Difficult to assess pt effort with mobility.    Follow Up Recommendations  SNF     Equipment Recommendations  Other (comment) (to be determined at next venue)    Recommendations for Other Services       Precautions / Restrictions Precautions Precautions: None Restrictions Weight Bearing Restrictions: No    Mobility  Bed Mobility Overal bed mobility: Needs Assistance Bed Mobility: Supine to Sit     Supine to sit: Mod assist     General bed mobility comments: Assist to elevate trunk into sitting and bring hips to EOB  Transfers Overall transfer level: Needs assistance Equipment used: 2 person hand held assist;Ambulation equipment used Transfers: Sit to/from [facility] Sit to Stand: Mod assist;+2 physical assistance;Max assist Stand pivot transfers: +2 physical assistance (with Stedy)       General transfer comment: Assist to elevate hips/trunk. Initial stand required +2 max assist. Lt foot sliding forward and knees had to be blocked. Used bed pad under hips to assist.  On subsequent  stands came up with +2 mod assist  Ambulation/Gait             General Gait Details: unable at this time    Stairs            Wheelchair Mobility    Modified Rankin (Stroke Patients Only)       Balance Overall balance assessment: Needs assistance Sitting-balance support: Feet supported;No upper extremity supported Sitting balance-Leahy Scale: Fair     Standing balance support: Bilateral upper extremity supported Standing balance-Leahy Scale: Zero Standing balance comment: Stood x 2 with HHA and x 1 with Stedy. +2 mod assist to maintain standing                            Cognition Arousal/Alertness: Lethargic Behavior During Therapy: Flat affect Overall Cognitive Status: Difficult to assess                                        Exercises      General Comments        Pertinent Vitals/Pain Pain Assessment: Faces Faces Pain Scale: Hurts little more Pain Location: stomach Pain Descriptors / Indicators: Guarding Pain Intervention(s): Monitored during session;Repositioned    Home Living                      Prior Function            PT Goals (current goals can now be found in the care plan section) Progress towards PT  goals: Not progressing toward goals - comment    Frequency    Min 3X/week      PT Plan Current plan remains appropriate    Co-evaluation              AM-PAC PT "6 Clicks" Daily Activity  Outcome Measure  Difficulty turning over in bed (including adjusting bedclothes, sheets and blankets)?: Unable Difficulty moving from lying on back to sitting on the side of the bed? : Unable Difficulty sitting down on and standing up from a chair with arms (e.g., wheelchair, bedside commode, etc,.)?: Unable Help needed moving to and from a bed to chair (including a wheelchair)?: Total Help needed walking in hospital room?: Total Help needed climbing 3-5 steps with a railing? : Total 6 Click  Score: 6    End of Session Equipment Utilized During Treatment: Gait belt Activity Tolerance: Patient limited by fatigue Patient left: with call bell/phone within reach;in chair;with chair alarm set Nurse Communication: Mobility status;Need for lift equipment PT Visit Diagnosis: Unsteadiness on feet (R26.81);Other abnormalities of gait and mobility (R26.89);Muscle weakness (generalized) (M62.81);Difficulty in walking, not elsewhere classified (R26.2);Other symptoms and signs involving the nervous system (R29.898);Pain Pain - part of body:  (stomach)     Time: 8676-7209 PT Time Calculation (min) (ACUTE ONLY): 23 min  Charges:  $Therapeutic Activity: 23-37 mins                    G Codes:       [facility] PT [place] 03/28/2017, 4:01 PM

## 2017-03-28 NOTE — Progress Notes (Signed)
PROGRESS NOTE  [name] VVO:[id number] DOB: 02-01-1966 DOA: 03/24/2017 PCP: [name], MD   LOS: 3 days   Brief Narrative / Interim history: 51 YEAR old lady with prior h/o IDDM, multiple admissions for DKA, gastroparesis comes in for nausea, and vomiting for a few days. She was admitted to [facility] service for possible serotonin syndrome.   Assessment & Plan: Active Problems:   Serotonin syndrome   Tardive dyskinesia   Acute dystonic reaction -Patient ongoing nausea as well as generalized abdominal pain make neuro exam somewhat difficult, however she appears to be still rigid on exam, consulted neurology, appreciate input -Add Benadryl -Feel that she is somewhat improved today  Intractable nausea vomiting -Due to gastroparesis, last time she was hospitalized she was discharged on Reglan which may explain #1, however she has been on Reglan in the past.  It does not appear that she had serotonin syndrome -Scheduled Zofran for now -Due to persistent symptoms I have consulted gastroenterology today, appreciate input.  She has been seen by gastroenterology at [facility] as well  Anxiety / depression -We will resume buspirone and fluoxetine when she is able to take p.o -Consulted psychiatry today for further recommendations regarding oral regimen given concern for dystonic reaction  SIRS -No apparent source of infection, discontinue vancomycin, cultures have remained negative, discontinue cefepime today  Insulin-dependent diabetes mellitus with recurrent DKA's -? Non compliance, DKA has now resolved  HTN -hold Lisinopril until able to have consistent po intake   DVT prophylaxis: heparin Code Status: Full code Family Communication: no family at bedside Disposition Plan: home when ready   Consultants:   Neurology   GI  Psychiatry  Procedures:   None   Antimicrobials:  Vancomycin 10/1 >> 10/3  Cefepime  10/1 >> 10/4  Subjective: -Continues to have  significant nausea and vomiting, complains of generalized abdominal pain as well  Objective: Vitals:   03/28/17 0300 03/28/17 0320 03/28/17 0900 03/28/17 1159  BP: (!) 160/76  (!) 159/78 123/63  Pulse: 96     Resp: 15     Temp:  98.2 F (36.8 C) 97.8 F (36.6 C) 98.1 F (36.7 C)  TempSrc:  Oral Oral Oral  SpO2: 100%     Weight: 57.1 kg (125 lb 14.4 oz)     Height: 5\' 1"  (1.549 m)       Intake/Output Summary (Last 24 hours) at 03/28/17 1355 Last data filed at 03/28/17 1159  Gross per 24 hour  Intake          2851.25 ml  Output             2800 ml  Net            51.25 ml   Filed Weights   03/26/17 1017 03/27/17 0500 03/28/17 0300  Weight: 59.4 kg (131 lb) 61.9 kg (136 lb 6.4 oz) 57.1 kg (125 lb 14.4 oz)    Examination:  Constitutional: Rocking back and forth Eyes: No scleral icterus Respiratory: Clear to auscultation, no wheezing, no crackles Cardiovascular: Regular rate and rhythm, no murmurs rubs or gallops.  No edema. Abdomen: No tenderness, bowel sounds positive Skin: No rashes Neurologic: Grossly nonfocal today  Data Reviewed: I have independently reviewed following labs and imaging studies   CBC:  Recent Labs Lab 03/24/17 2335 03/25/17 0623 03/26/17 0408  WBC 6.7 10.3 8.2  NEUTROABS 5.2  --   --   HGB 9.5* 10.8* 8.1*  HCT 31.0* 35.6* 27.2*  MCV 72.8* 73.4* 73.5*  PLT 467* 375 542   Basic Metabolic Panel:  Recent Labs Lab 03/25/17 1955 03/26/17 0116 03/26/17 0408 03/28/17 0402 03/28/17 0600  NA 136 136 137 129* 131*  K 3.5 3.4* 3.5 6.1* 4.3  CL 104 107 106 100* 100*  CO2 18* 24 25 22 24   GLUCOSE 179* 116* 109* 387* 176*  BUN 7 9 7  <5* <5*  CREATININE 0.92 0.84 0.79 0.82 0.92  CALCIUM 8.8* 8.2* 8.4* 8.3* 8.7*  MG  --   --  1.7  --   --   PHOS  --   --  2.6  --   --    GFR: Estimated Creatinine Clearance: 55.8 mL/min (by C-G formula based on SCr of 0.92 mg/dL). Liver Function Tests:  Recent Labs Lab 03/24/17 2335  AST 32  ALT 24    ALKPHOS 91  BILITOT 1.0  PROT 8.2*  ALBUMIN 4.2   No results for input(s): LIPASE, AMYLASE in the last 168 hours. No results for input(s): AMMONIA in the last 168 hours. Coagulation Profile: No results for input(s): INR, PROTIME in the last 168 hours. Cardiac Enzymes:  Recent Labs Lab 03/24/17 2335  CKTOTAL 82  TROPONINI <0.03   BNP (last 3 results) No results for input(s): PROBNP in the last 8760 hours. HbA1C: No results for input(s): HGBA1C in the last 72 hours. CBG:  Recent Labs Lab 03/27/17 2325 03/28/17 0322 03/28/17 0515 03/28/17 0912 03/28/17 1201  GLUCAP 171* 216* 193* 206* 173*   Lipid Profile: No results for input(s): CHOL, HDL, LDLCALC, TRIG, CHOLHDL, LDLDIRECT in the last 72 hours. Thyroid Function Tests: No results for input(s): TSH, T4TOTAL, FREET4, T3FREE, THYROIDAB in the last 72 hours. Anemia Panel: No results for input(s): VITAMINB12, FOLATE, FERRITIN, TIBC, IRON, RETICCTPCT in the last 72 hours. Urine analysis:    Component Value Date/Time   COLORURINE YELLOW 03/24/2017 [place] 03/24/2017 2359   LABSPEC 1.020 03/24/2017 2359   PHURINE 7.0 03/24/2017 2359   GLUCOSEU >=500 (A) 03/24/2017 2359   HGBUR TRACE (A) 03/24/2017 2359   BILIRUBINUR NEGATIVE 03/24/2017 2359   BILIRUBINUR neg 01/17/2017 1533   KETONESUR 15 (A) 03/24/2017 2359   PROTEINUR 100 (A) 03/24/2017 2359   UROBILINOGEN 0.2 01/17/2017 1533   UROBILINOGEN 0.2 04/27/2015 1608   NITRITE NEGATIVE 03/24/2017 2359   LEUKOCYTESUR NEGATIVE 03/24/2017 2359   Sepsis Labs: Invalid input(s): PROCALCITONIN, LACTICIDVEN  Recent Results (from the past 240 hour(s))  MRSA PCR Screening     Status: None   Collection Time: 03/25/17  4:21 AM  Result Value Ref Range Status   MRSA by PCR NEGATIVE NEGATIVE Final    Comment:        The GeneXpert MRSA Assay (FDA approved for NASAL specimens only), is one component of a comprehensive MRSA colonization surveillance program. It  is not intended to diagnose MRSA infection nor to guide or monitor treatment for MRSA infections.   Culture, blood (routine x 2)     Status: None (Preliminary result)   Collection Time: 03/25/17 11:17 AM  Result Value Ref Range Status   Specimen Description BLOOD LEFT WRIST  Final   Special Requests IN PEDIATRIC BOTTLE Blood Culture adequate volume  Final   Culture NO GROWTH 2 DAYS  Final   Report Status PENDING  Incomplete  Culture, blood (routine x 2)     Status: None (Preliminary result)   Collection Time: 03/25/17 11:49 AM  Result Value Ref Range Status   Specimen Description BLOOD LEFT HAND  Final   Special Requests IN PEDIATRIC BOTTLE Blood Culture adequate volume  Final   Culture NO GROWTH 2 DAYS  Final   Report Status PENDING  Incomplete      Radiology Studies: No results found.   Scheduled Meds: . benztropine mesylate  1 mg Intravenous BID  . chlorhexidine  15 mL Mouth Rinse BID  . diphenhydrAMINE  12.5 mg Intravenous Q6H  . heparin  5,000 Units Subcutaneous Q8H  . insulin aspart  0-9 Units Subcutaneous Q4H  . insulin detemir  12 Units Subcutaneous QHS  . mouth rinse  15 mL Mouth Rinse q12n4p  . ondansetron (ZOFRAN) IV  4 mg Intravenous Q6H  . pantoprazole (PROTONIX) IV  40 mg Intravenous Q24H   Continuous Infusions: . sodium chloride Stopped (03/25/17 2300)  . sodium chloride Stopped (03/25/17 0830)  . ceFEPime (MAXIPIME) IV Stopped (03/28/17 1321)  . dextrose 5 % and 0.45 % NaCl with KCl 40 mEq/L 75 mL/hr at 03/28/17 0556  . potassium chloride      Marzetta Board, MD, PhD [facility] Pager [phone]  If 7PM-7AM, please contact night-coverage [email or web address] Password [email or web address] 03/28/2017, 1:55 PM

## 2017-03-29 DIAGNOSIS — D509 Iron deficiency anemia, unspecified: Secondary | ICD-10-CM

## 2017-03-29 DIAGNOSIS — E871 Hypo-osmolality and hyponatremia: Secondary | ICD-10-CM

## 2017-03-29 LAB — CBC
HEMATOCRIT: 30.3 % — AB (ref 36.0–46.0)
HEMOGLOBIN: 8.9 g/dL — AB (ref 12.0–15.0)
MCH: 21.4 pg — AB (ref 26.0–34.0)
MCHC: 29.4 g/dL — AB (ref 30.0–36.0)
MCV: 73 fL — AB (ref 78.0–100.0)
Platelets: 368 10*3/uL (ref 150–400)
RBC: 4.15 MIL/uL (ref 3.87–5.11)
RDW: 16.9 % — ABNORMAL HIGH (ref 11.5–15.5)
WBC: 7.5 10*3/uL (ref 4.0–10.5)

## 2017-03-29 LAB — BASIC METABOLIC PANEL
ANION GAP: 5 (ref 5–15)
BUN: <5 mg/dL — ABNORMAL LOW (ref 6–20)
CALCIUM: 8.5 mg/dL — AB (ref 8.9–10.3)
CHLORIDE: 102 mmol/L (ref 101–111)
CO2: 22 mmol/L (ref 22–32)
Creatinine, Ser: 0.86 mg/dL (ref 0.44–1.00)
GFR calc Af Amer: >60 mL/min (ref >60)
GFR calc non Af Amer: >60 mL/min (ref >60)
GLUCOSE: 145 mg/dL — AB (ref 65–99)
Potassium: 4.6 mmol/L (ref 3.5–5.1)
Sodium: 129 mmol/L — ABNORMAL LOW (ref 135–145)

## 2017-03-29 LAB — GLUCOSE, CAPILLARY
GLUCOSE-CAPILLARY: 154 mg/dL — AB (ref 65–99)
GLUCOSE-CAPILLARY: 208 mg/dL — AB (ref 65–99)
Glucose-Capillary: 146 mg/dL — ABNORMAL HIGH (ref 65–99)
Glucose-Capillary: 195 mg/dL — ABNORMAL HIGH (ref 65–99)
Glucose-Capillary: 225 mg/dL — ABNORMAL HIGH (ref 65–99)

## 2017-03-29 MED ORDER — SODIUM CHLORIDE 0.9 % IV SOLN
8.0000 mg | Freq: Three times a day (TID) | INTRAVENOUS | Status: DC
  Administered 2017-03-29 – 2017-04-03 (×14): 8 mg via INTRAVENOUS
  Filled 2017-03-29 (×17): qty 4

## 2017-03-29 MED ORDER — LABETALOL HCL 5 MG/ML IV SOLN: 5.0000 mg | INTRAVENOUS | Status: DC | PRN

## 2017-03-29 NOTE — Progress Notes (Signed)
Pts Na: 129 this AM. Relayed to attending MD

## 2017-03-29 NOTE — Consult Note (Signed)
[facility] Consult   Reason for Consult:  Medication management for dystonia Referring Physician:  Dr. Cruzita Lederer Patient Identification: [name] MRN:  [id number] Principal Diagnosis: Serotonin syndrome Diagnosis:   Patient Active Problem List   Diagnosis Date Noted  . Serotonin syndrome [G25.79] 03/25/2017  . Tardive dyskinesia [G24.01]   . Tachycardia [R00.0] 03/16/2017  . Diarrhea [R19.7] 03/05/2017  . Hypokalemia [E87.6] 02/06/2017  . Intractable nausea and vomiting [R11.2] 02/05/2017  . Gastroparesis [K31.84] 04/20/2016  . Type 1 diabetes mellitus with ketoacidosis without coma ([place]) [E10.10]   . DKA (diabetic ketoacidoses) ([place]) [E13.10] 03/18/2016  . Noncompliance with treatment plan [Z91.11] 03/13/2016  . Essential hypertension [I10] 01/24/2016  . Leukocytosis [D72.829] 01/24/2016  . Abnormal ECG [R94.31] 11/27/2015  . HLD (hyperlipidemia) [E78.5]   . GERD (gastroesophageal reflux disease) [K21.9]   . Metabolic acidosis, normal anion gap (NAG) [E87.2]   . AKI (acute kidney injury) ([place]) [N17.9]   . Anemia, iron deficiency [D50.9]   . DKA, type 1, not at goal [facility]) [E10.10]   . Hyponatremia [E87.1] 09/02/2015  . Vitamin B12 deficiency [E53.8] 08/16/2015  . Lactic acidosis [E87.2] 07/29/2015  . Acute pyelonephritis [N10] 07/29/2015  . Pyelonephritis [N12]   . Hypothermia [T68.XXXA]   . Diabetic gastroparesis ([place]) [E11.43, K31.84]   . Diabetic neuropathy, type I diabetes mellitus ([place]) [E10.40] 05/18/2015  . Anemia [D64.9] 05/18/2015  . Anxiety and depression [F41.9, F32.9] 05/18/2015  . Nausea & vomiting [R11.2]   . Diabetic ketoacidosis without coma associated with type 1 diabetes mellitus ([place]) [E10.10]   . High anion gap metabolic acidosis [H68.0] 02/13/2013    Total Time spent with patient: 1 hour  Subjective:   [name] is a 51 y.o. female patient admitted with nausea, and vomiting.  HPI: [name] is a 51 years old female  admitted to the: [facility] with diabetic ketoacidosis, gastroparesis, nausea and vomiting for the last few days. Patient has multiple previous medical admissions for the same. Patient received Reglan antiemetic medication and also continue taking her medication BuSpar which resulted possible serotonin syndrome and a dystonic reaction. Patient was evaluated by neurology who recommended benztropine and Benadryl. Patient appeared with the distress regarding GI symptoms and also ongoing nausea and vomiting's Patient was mostly spitting then vomiting during my evaluation and observation.  Past Psychiatric History: Anxiety and depression, Has no history of acute psychiatric hospitalization. Patient never been treated by psychiatric services.  Risk to Self: Is patient at risk for suicide?: No Risk to Others:   Prior Inpatient Therapy:   Prior Outpatient Therapy:    Past Medical History:  Past Medical History:  Diagnosis Date  . Depression   . Diabetes mellitus without complication ([place])   . Essential hypertension   . Gastroparesis   . GERD (gastroesophageal reflux disease)   . HLD (hyperlipidemia)     Past Surgical History:  Procedure Laterality Date  . COLONOSCOPY  09/27/2014   at [facility]  . ESOPHAGOGASTRODUODENOSCOPY  09/27/2014   at [facility], Dr Rolan Lipa. biospy neg for celiac, neg for H pylori.   . EYE SURGERY    . [name]    . IR FLUORO GUIDE CV LINE RIGHT  02/01/2017  . IR FLUORO GUIDE CV LINE RIGHT  03/06/2017  . IR FLUORO GUIDE CV LINE RIGHT  03/25/2017  . IR GENERIC HISTORICAL  01/24/2016   IR FLUORO GUIDE CV LINE RIGHT 01/24/2016 Darrell K Allred, PA-C [facility]INTER[facility]  . IR GENERIC HISTORICAL  01/24/2016   IR  US GUIDE VASC ACCESS RIGHT 01/24/2016 Darrell K Allred, PA-C [facility]  . IR US GUIDE VASC ACCESS RIGHT  02/01/2017  . IR US GUIDE VASC ACCESS RIGHT  03/06/2017  . IR US GUIDE VASC ACCESS RIGHT  03/25/2017  . POSTERIOR VITRECTOMY AND MEMBRANE PEEL-LEFT EYE  09/28/2002  .  POSTERIOR VITRECTOMY AND MEMBRANE PEEL-RIGHT EYE  03/16/2002  . RETINAL DETACHMENT SURGERY     Family History:  Family History  Problem Relation Age of Onset  . Cystic fibrosis Mother   . Hypertension Father   . Diabetes Brother   . Hypertension Maternal Grandmother    Family Psychiatric  History: Unknown Social History:  History  Alcohol Use No     History  Drug Use No    Social History   Social History  . Marital status: Single    Spouse name: N/A  . Number of children: N/A  . Years of education: N/A   Social History Main Topics  . Smoking status: Never Smoker  . Smokeless tobacco: Never Used  . Alcohol use No  . Drug use: No  . Sexual activity: Not Currently   Other Topics Concern  . None   Social History Narrative  . None   Additional Social History:    Allergies:   Allergies  Allergen Reactions  . Anesthetics, Amide Nausea And Vomiting  . Penicillins Diarrhea and Nausea And Vomiting    Has patient had a PCN reaction causing immediate rash, facial/tongue/throat swelling, SOB or lightheadedness with hypotension: No Has patient had a PCN reaction causing severe rash involving mucus membranes or skin necrosis: No Has patient had a PCN reaction that required hospitalization No Has patient had a PCN reaction occurring within the last 10 years: Yes  If all of the above answers are "NO", then may proceed with Cephalosporin use.   . Buprenorphine Hcl Rash  . Encainide Nausea And Vomiting  . Metoclopramide Other (See Comments)    Dystonia, muscle rigidity.     Labs:  Results for orders placed or performed during the hospital encounter of 03/24/17 (from the past 48 hour(s))  Glucose, capillary     Status: Abnormal   Collection Time: 03/27/17 11:22 AM  Result Value Ref Range   Glucose-Capillary 195 (H) 65 - 99 mg/dL   Comment 1 Notify RN    Comment 2 Document in Chart   Glucose, capillary     Status: Abnormal   Collection Time: 03/27/17  3:38 PM  Result  Value Ref Range   Glucose-Capillary 235 (H) 65 - 99 mg/dL  Glucose, capillary     Status: Abnormal   Collection Time: 03/27/17  7:44 PM  Result Value Ref Range   Glucose-Capillary 194 (H) 65 - 99 mg/dL   Comment 1 Notify RN    Comment 2 Document in Chart   Glucose, capillary     Status: Abnormal   Collection Time: 03/27/17 11:25 PM  Result Value Ref Range   Glucose-Capillary 171 (H) 65 - 99 mg/dL  Glucose, capillary     Status: Abnormal   Collection Time: 03/28/17  3:22 AM  Result Value Ref Range   Glucose-Capillary 216 (H) 65 - 99 mg/dL  Basic metabolic panel     Status: Abnormal   Collection Time: 03/28/17  4:02 AM  Result Value Ref Range   Sodium 129 (L) 135 - 145 mmol/L    Comment: DELTA CHECK NOTED   Potassium 6.1 (H) 3.5 - 5.1 mmol/L    Comment: DELTA CHECK  NOTED NO VISIBLE HEMOLYSIS RESULT CALLED TO, READ BACK BY AND VERIFIED WITH: M WOOD,RN 03/28/17 0507 WILDERK    Chloride 100 (L) 101 - 111 mmol/L   CO2 22 22 - 32 mmol/L   Glucose, Bld 387 (H) 65 - 99 mg/dL   BUN <5 (L) 6 - 20 mg/dL   Creatinine, Ser 0.82 0.44 - 1.00 mg/dL   Calcium 8.3 (L) 8.9 - 10.3 mg/dL   GFR calc non Af Amer >60 >60 mL/min   GFR calc Af Amer >60 >60 mL/min    Comment: (NOTE) The eGFR has been calculated using the CKD EPI equation. This calculation has not been validated in all clinical situations. eGFR's persistently <60 mL/min signify possible Chronic Kidney Disease.    Anion gap 7 5 - 15  Glucose, capillary     Status: Abnormal   Collection Time: 03/28/17  5:15 AM  Result Value Ref Range   Glucose-Capillary 193 (H) 65 - 99 mg/dL  Basic metabolic panel     Status: Abnormal   Collection Time: 03/28/17  6:00 AM  Result Value Ref Range   Sodium 131 (L) 135 - 145 mmol/L   Potassium 4.3 3.5 - 5.1 mmol/L   Chloride 100 (L) 101 - 111 mmol/L   CO2 24 22 - 32 mmol/L   Glucose, Bld 176 (H) 65 - 99 mg/dL   BUN <5 (L) 6 - 20 mg/dL   Creatinine, Ser 0.92 0.44 - 1.00 mg/dL   Calcium 8.7 (L)  8.9 - 10.3 mg/dL   GFR calc non Af Amer >60 >60 mL/min   GFR calc Af Amer >60 >60 mL/min    Comment: (NOTE) The eGFR has been calculated using the CKD EPI equation. This calculation has not been validated in all clinical situations. eGFR's persistently <60 mL/min signify possible Chronic Kidney Disease.    Anion gap 7 5 - 15  Glucose, capillary     Status: Abnormal   Collection Time: 03/28/17  9:12 AM  Result Value Ref Range   Glucose-Capillary 206 (H) 65 - 99 mg/dL  Glucose, capillary     Status: Abnormal   Collection Time: 03/28/17 12:01 PM  Result Value Ref Range   Glucose-Capillary 173 (H) 65 - 99 mg/dL  Glucose, capillary     Status: Abnormal   Collection Time: 03/28/17  6:03 PM  Result Value Ref Range   Glucose-Capillary 169 (H) 65 - 99 mg/dL  Glucose, capillary     Status: Abnormal   Collection Time: 03/28/17  7:35 PM  Result Value Ref Range   Glucose-Capillary 206 (H) 65 - 99 mg/dL  Glucose, capillary     Status: Abnormal   Collection Time: 03/28/17 11:06 PM  Result Value Ref Range   Glucose-Capillary 138 (H) 65 - 99 mg/dL  Glucose, capillary     Status: Abnormal   Collection Time: 03/29/17  3:16 AM  Result Value Ref Range   Glucose-Capillary 154 (H) 65 - 99 mg/dL  Basic metabolic panel     Status: Abnormal   Collection Time: 03/29/17  6:17 AM  Result Value Ref Range   Sodium 129 (L) 135 - 145 mmol/L   Potassium 4.6 3.5 - 5.1 mmol/L   Chloride 102 101 - 111 mmol/L   CO2 22 22 - 32 mmol/L   Glucose, Bld 145 (H) 65 - 99 mg/dL   BUN <5 (L) 6 - 20 mg/dL   Creatinine, Ser 0.86 0.44 - 1.00 mg/dL   Calcium 8.5 (L) 8.9 - 10.3 mg/dL  GFR calc non Af Amer >60 >60 mL/min   GFR calc Af Amer >60 >60 mL/min    Comment: (NOTE) The eGFR has been calculated using the CKD EPI equation. This calculation has not been validated in all clinical situations. eGFR's persistently <60 mL/min signify possible Chronic Kidney Disease.    Anion gap 5 5 - 15  CBC     Status: Abnormal    Collection Time: 03/29/17  6:17 AM  Result Value Ref Range   WBC 7.5 4.0 - 10.5 K/uL   RBC 4.15 3.87 - 5.11 MIL/uL   Hemoglobin 8.9 (L) 12.0 - 15.0 g/dL   HCT 30.3 (L) 36.0 - 46.0 %   MCV 73.0 (L) 78.0 - 100.0 fL   MCH 21.4 (L) 26.0 - 34.0 pg   MCHC 29.4 (L) 30.0 - 36.0 g/dL   RDW 16.9 (H) 11.5 - 15.5 %   Platelets 368 150 - 400 K/uL    Current Facility-Administered Medications  Medication Dose Route Frequency Provider Last Rate Last Dose  . 0.9 %  sodium chloride infusion   Intravenous Continuous Palumbo, April, MD   Stopped at 03/25/17 2300  . 0.9 %  sodium chloride infusion   Intravenous Continuous Scatliffe, Rise Paganini, MD   Stopped at 03/25/17 0830  . benztropine mesylate (COGENTIN) injection 1 mg  1 mg Intravenous BID Scatliffe, Rise Paganini, MD   1 mg at 03/29/17 0933  . chlorhexidine (PERIDEX) 0.12 % solution 15 mL  15 mL Mouth Rinse BID Rush Farmer, MD   15 mL at 03/29/17 0940  . dextrose 5 % and 0.45 % NaCl with KCl 40 mEq/L infusion   Intravenous Continuous Juanito Doom, MD 75 mL/hr at 03/28/17 1853    . diphenhydrAMINE (BENADRYL) injection 12.5 mg  12.5 mg Intravenous Q6H Marliss Coots, PA-C   12.5 mg at 03/29/17 0932  . heparin injection 5,000 Units  5,000 Units Subcutaneous Q8H Scatliffe, Rise Paganini, MD   5,000 Units at 03/29/17 [id number]  . insulin aspart (novoLOG) injection 0-9 Units  0-9 Units Subcutaneous Q4H Sampson Goon, MD   2 Units at 03/29/17 0325  . insulin detemir (LEVEMIR) injection 12 Units  12 Units Subcutaneous QHS Sampson Goon, MD   12 Units at 03/28/17 2311  . lidocaine (PF) (XYLOCAINE) 1 % injection   Other PRN Saverio Danker, PA-C   10 mL at 03/25/17 1506  . LORazepam (ATIVAN) injection 1 mg  1 mg Intravenous Q4H PRN Hosie Poisson, MD   1 mg at 03/27/17 0646  . MEDLINE mouth rinse  15 mL Mouth Rinse q12n4p Rush Farmer, MD   15 mL at 03/28/17 1846  . morphine 4 MG/ML injection 1-2 mg  1-2 mg Intravenous Q4H PRN Caren Griffins, MD   1 mg at  03/29/17 0953  . ondansetron (ZOFRAN) injection 4 mg  4 mg Intravenous Q6H Caren Griffins, MD   4 mg at 03/29/17 0626  . pantoprazole (PROTONIX) injection 40 mg  40 mg Intravenous Q24H Scatliffe, Kristen D, MD   40 mg at 03/29/17 0930  . potassium chloride 10 mEq in 100 mL IVPB  10 mEq Intravenous Q1H PRN Scatliffe, Rise Paganini, MD        Musculoskeletal: Strength & Muscle Tone: decreased Gait & Station: unable to stand Patient leans: N/A  Psychiatric Specialty Exam: Physical Exam as per history and physical  ROS nausea vomiting anxiety and ongoing GI distress. No Fever-chills, No Headache, No changes with Vision  or hearing, reports vertigo No problems swallowing food or Liquids, No Chest pain, Cough or Shortness of Breath, No Abdominal pain, No Nausea or Vommitting, Bowel movements are regular, No Blood in stool or Urine, No dysuria, No new skin rashes or bruises, No new joints pains-aches,  No new weakness, tingling, numbness in any extremity, No recent weight gain or loss, No polyuria, polydypsia or polyphagia,  A full 10 point Review of Systems was done, except as stated above, all other Review of Systems were negative.  Blood pressure (!) 171/85, pulse (!) 112, temperature 98.3 F (36.8 C), temperature source Oral, resp. rate (!) 22, height _0  (1.549 m), weight 57.1 kg (125 lb 14.4 oz), last menstrual period 03/24/2017, SpO2 100 %.Body mass index is 23.79 kg/m.  General Appearance: Disheveled and Guarded  Eye Contact:  Fair  Speech:  Clear and Coherent and Slow  Volume:  Decreased  Mood:  Anxious and Depressed  Affect:  Depressed and Flat  Thought Process:  Coherent and Goal Directed  Orientation:  Full (Time, Place, and Person)  Thought Content:  Rumination  Suicidal Thoughts:  No  Homicidal Thoughts:  No  Memory:  Immediate;   Fair Recent;   Fair Remote;   Fair  Judgement:  Fair  Insight:  Fair  Psychomotor Activity:  Decreased and Restlessness   Concentration:  Concentration: Fair and Attention Span: Fair  Recall:  [employer] of Knowledge:  Good  Language:  Good  Akathisia:  Negative  Handed:  Right  AIMS (if indicated):     Assets:  Communication Skills Desire for Improvement Financial Resources/Insurance Housing Leisure Time Resilience Social Support Transportation  ADL's:  Impaired  Cognition:  WNL  Sleep:        Treatment Plan Summary: 51 years old female with the depression, anxiety, diabetic ketoacidosis, diabetic gastroparesis presented with few days history of nausea or vomiting and unable to function at home. Patient is also develop diabetic neuropathy. Patient has dystonic reaction secondary to BuSpar and Reglan  Recommendation: Discontinue BuSpar and Reglan May use clonazepam 0.5 mg  3 Times daily for excessive anxiety Continue benztropine 1 mg 2 times daily for EPS Continue Benadryl 12.5 mg twice daily as needed Patient benefit from IM or IV medications as she was not able to take orally We stay away from antipsychotic medication at this time Daily contact with patient to assess and evaluate symptoms and progress in treatment and Medication management  Disposition: Patient will be referred to the outpatient psychiatric medication management and medically stable. No evidence of imminent risk to self or others at present.   Supportive therapy provided about ongoing stressors.  Ambrose Finland, MD 03/29/2017 10:37 AM

## 2017-03-29 NOTE — Progress Notes (Signed)
PROGRESS NOTE  [name] PPJ:[id number] DOB: [birth year] DOA: 03/24/2017 PCP: [name], MD   LOS: 4 days   Brief Narrative / Interim history: 51 YEAR old lady with prior h/o IDDM, multiple admissions for DKA, gastroparesis comes in for nausea, and vomiting for a few days. She was admitted to [facility] service for possible serotonin syndrome.   Assessment & Plan: Principal Problem:   Serotonin syndrome Active Problems:   Tardive dyskinesia   Acute dystonic reaction -Patient ongoing nausea as well as generalized abdominal pain make neuro exam somewhat difficult, however she appears to be still rigid on exam, consulted neurology, appreciate input -Added Benadryl, will use clonazepam as soon as she is able to take any p.o. intake -Improving, appreciate neurology and psychiatry input  Intractable nausea vomiting -Due to gastroparesis, last time she was hospitalized she was discharged on Reglan which may explain #1, however she has been on Reglan in the past.  It does not appear that she had serotonin syndrome -Scheduled Zofran for now, increase dosing today she has persistent symptoms -Gastroenterology following, appreciate input, briefly discussed with Dr. Fuller Plan today  Anxiety / depression -Psychiatry consulted, appreciate input, discussed with Dr. Lenna Sciara today, she is to continue benztropine, continue Benadryl as well as clonazepam for excessive anxiety  SIRS -No apparent source of infection, her vancomycin and cefepime have been discontinued, she remains afebrile and her white count is normal.  Cultures are still negative.  Insulin-dependent diabetes mellitus with recurrent DKA's -? Non compliance, DKA has now resolved, bicarb remains within normal limits, she has no anion gap -Fasting CBG this morning 154, continue current regimen  HTN -hold Lisinopril until able to have consistent po intake, add labetalol as needed  Hyponatremia -Slightly dry, continue fluids  Acute urinary  retention -Likely due to home medication, had to put back the Foley catheter this morning, likely need a voiding trial in 2-3 days   DVT prophylaxis: heparin Code Status: Full code Family Communication: no family at bedside Disposition Plan: home when ready   Consultants:   Neurology   GI  Psychiatry  Procedures:   None   Antimicrobials:  Vancomycin 10/1 >> 10/3  Cefepime  10/1 >> 10/4  Subjective: -Continues to complain of significant ongoing nausea, abdominal pain.  No chest pain or shortness of breath.  Objective: Vitals:   03/28/17 2302 03/29/17 0300 03/29/17 0728 03/29/17 1144  BP: 93/60 (!) 146/71 (!) 171/85 (!) 155/79  Pulse:   (!) 112 (!) 114  Resp: (!) 25 (!) 25 (!) 22 (!) 24  Temp: 98.3 F (36.8 C) 98.4 F (36.9 C) 98.3 F (36.8 C) 98.5 F (36.9 C)  TempSrc: Oral Oral Oral Oral  SpO2: 100% 99% 100% 100%  Weight:      Height:        Intake/Output Summary (Last 24 hours) at 03/29/17 1224 Last data filed at 03/29/17 1145  Gross per 24 hour  Intake             1575 ml  Output             1350 ml  Net              225 ml   Filed Weights   03/26/17 1017 03/27/17 0500 03/28/17 0300  Weight: 59.4 kg (131 lb) 61.9 kg (136 lb 6.4 oz) 57.1 kg (125 lb 14.4 oz)    Examination:  Constitutional: Appears in distress, rocking back and forth Eyes: No scleral icterus, lids and conjunctivae normal Respiratory:  Clear to auscultation, no wheezing, no crackles Cardiovascular: RRR, no murmurs Abdomen: No tenderness, bowel sounds positive Skin: no rashes  Neurologic: non focal   Data Reviewed: I have independently reviewed following labs and imaging studies   CBC:  Recent Labs Lab 03/24/17 2335 03/25/17 0623 03/26/17 0408 03/29/17 0617  WBC 6.7 10.3 8.2 7.5  NEUTROABS 5.2  --   --   --   HGB 9.5* 10.8* 8.1* 8.9*  HCT 31.0* 35.6* 27.2* 30.3*  MCV 72.8* 73.4* 73.5* 73.0*  PLT 467* 375 395 638   Basic Metabolic Panel:  Recent Labs Lab  03/26/17 0116 03/26/17 0408 03/28/17 0402 03/28/17 0600 03/29/17 0617  NA 136 137 129* 131* 129*  K 3.4* 3.5 6.1* 4.3 4.6  CL 107 106 100* 100* 102  CO2 24 25 22 24 22   GLUCOSE 116* 109* 387* 176* 145*  BUN 9 7 <5* <5* <5*  CREATININE 0.84 0.79 0.82 0.92 0.86  CALCIUM 8.2* 8.4* 8.3* 8.7* 8.5*  MG  --  1.7  --   --   --   PHOS  --  2.6  --   --   --    GFR: Estimated Creatinine Clearance: 59.7 mL/min (by C-G formula based on SCr of 0.86 mg/dL). Liver Function Tests:  Recent Labs Lab 03/24/17 2335  AST 32  ALT 24  ALKPHOS 91  BILITOT 1.0  PROT 8.2*  ALBUMIN 4.2   No results for input(s): LIPASE, AMYLASE in the last 168 hours. No results for input(s): AMMONIA in the last 168 hours. Coagulation Profile: No results for input(s): INR, PROTIME in the last 168 hours. Cardiac Enzymes:  Recent Labs Lab 03/24/17 2335  CKTOTAL 82  TROPONINI <0.03   BNP (last 3 results) No results for input(s): PROBNP in the last 8760 hours. HbA1C: No results for input(s): HGBA1C in the last 72 hours. CBG:  Recent Labs Lab 03/28/17 1201 03/28/17 1803 03/28/17 1935 03/28/17 2306 03/29/17 0316  GLUCAP 173* 169* 206* 138* 154*   Lipid Profile: No results for input(s): CHOL, HDL, LDLCALC, TRIG, CHOLHDL, LDLDIRECT in the last 72 hours. Thyroid Function Tests: No results for input(s): TSH, T4TOTAL, FREET4, T3FREE, THYROIDAB in the last 72 hours. Anemia Panel: No results for input(s): VITAMINB12, FOLATE, FERRITIN, TIBC, IRON, RETICCTPCT in the last 72 hours. Urine analysis:    Component Value Date/Time   COLORURINE YELLOW 03/24/2017 [place] 03/24/2017 2359   LABSPEC 1.020 03/24/2017 2359   PHURINE 7.0 03/24/2017 2359   GLUCOSEU >=500 (A) 03/24/2017 2359   HGBUR TRACE (A) 03/24/2017 2359   BILIRUBINUR NEGATIVE 03/24/2017 2359   BILIRUBINUR neg 01/17/2017 1533   KETONESUR 15 (A) 03/24/2017 2359   PROTEINUR 100 (A) 03/24/2017 2359   UROBILINOGEN 0.2 01/17/2017  1533   UROBILINOGEN 0.2 04/27/2015 1608   NITRITE NEGATIVE 03/24/2017 2359   LEUKOCYTESUR NEGATIVE 03/24/2017 2359   Sepsis Labs: Invalid input(s): PROCALCITONIN, LACTICIDVEN  Recent Results (from the past 240 hour(s))  MRSA PCR Screening     Status: None   Collection Time: 03/25/17  4:21 AM  Result Value Ref Range Status   MRSA by PCR NEGATIVE NEGATIVE Final    Comment:        The GeneXpert MRSA Assay (FDA approved for NASAL specimens only), is one component of a comprehensive MRSA colonization surveillance program. It is not intended to diagnose MRSA infection nor to guide or monitor treatment for MRSA infections.   Culture, blood (routine x 2)  Status: None (Preliminary result)   Collection Time: 03/25/17 11:17 AM  Result Value Ref Range Status   Specimen Description BLOOD LEFT WRIST  Final   Special Requests IN PEDIATRIC BOTTLE Blood Culture adequate volume  Final   Culture NO GROWTH 3 DAYS  Final   Report Status PENDING  Incomplete  Culture, blood (routine x 2)     Status: None (Preliminary result)   Collection Time: 03/25/17 11:49 AM  Result Value Ref Range Status   Specimen Description BLOOD LEFT HAND  Final   Special Requests IN PEDIATRIC BOTTLE Blood Culture adequate volume  Final   Culture NO GROWTH 3 DAYS  Final   Report Status PENDING  Incomplete      Radiology Studies: No results found.   Scheduled Meds: . benztropine mesylate  1 mg Intravenous BID  . chlorhexidine  15 mL Mouth Rinse BID  . diphenhydrAMINE  12.5 mg Intravenous Q6H  . heparin  5,000 Units Subcutaneous Q8H  . insulin aspart  0-9 Units Subcutaneous Q4H  . insulin detemir  12 Units Subcutaneous QHS  . mouth rinse  15 mL Mouth Rinse q12n4p  . pantoprazole (PROTONIX) IV  40 mg Intravenous Q24H   Continuous Infusions: . sodium chloride Stopped (03/25/17 2300)  . sodium chloride Stopped (03/25/17 0830)  . dextrose 5 % and 0.45 % NaCl with KCl 40 mEq/L 75 mL/hr at 03/29/17 1150  .  ondansetron (ZOFRAN) IV    . potassium chloride      Marzetta Board, MD, PhD [facility] Pager [phone]  If 7PM-7AM, please contact night-coverage [email or web address] Password [email or web address] 03/29/2017, 12:24 PM

## 2017-03-29 NOTE — Progress Notes (Signed)
Report received from the nurse. Pt arrived to the unit at 1650 via bed/

## 2017-03-29 NOTE — Progress Notes (Addendum)
Daily Rounding Note  03/29/2017, 10:17 AM  LOS: 4 days   SUBJECTIVE:   Chief complaint: persistent nausea and vomiting of "dark" material.  RN says vomit is green/bilious not CG or dark.  No BMs.  Epigastric , upper abd pain persists.  Often has pain when N/V flares.       Getting Morphine 1 to 2 mg every 4 to 6 hours for epigastric abdominal pain  Got 10 mg total yesterday, 3 mg so far today.   Getting scheduled Zofran and Protonix IV.     OBJECTIVE:         Vital signs in last 24 hours:    Temp:  [98.1 F (36.7 C)-98.4 F (36.9 C)] 98.3 F (36.8 C) (10/05 0728) Pulse Rate:  [95-112] 112 (10/05 0728) Resp:  [14-35] 22 (10/05 0728) BP: (93-171)/(56-88) 171/85 (10/05 0728) SpO2:  [98 %-100 %] 100 % (10/05 0728) Last BM Date: 03/25/17 Filed Weights   03/26/17 1017 03/27/17 0500 03/28/17 0300  Weight: 59.4 kg (131 lb) 61.9 kg (136 lb 6.4 oz) 57.1 kg (125 lb 14.4 oz)   General: looks miserable but not acutely ill   Heart: RRR Chest: clear bil.  No cough or labored breathing Abdomen: soft, tender in upper abdomen.  BS present  Extremities: no CCE  Neuro/Psych:  Speech halting, delayed but accurate.  No cogwheeling.  Oriented x 3.      Intake/Output from previous day: 10/04 0701 - 10/05 0700 In: 1575 [I.V.:1575] Out: 1150 [Urine:1150]  Intake/Output this shift: Total I/O In: -  Out: 150 [Urine:150]  Lab Results:  Recent Labs  03/29/17 0617  WBC 7.5  HGB 8.9*  HCT 30.3*  PLT 368   BMET  Recent Labs  03/28/17 0402 03/28/17 0600 03/29/17 0617  NA 129* 131* 129*  K 6.1* 4.3 4.6  CL 100* 100* 102  CO2 22 24 22   GLUCOSE 387* 176* 145*  BUN <5* <5* <5*  CREATININE 0.82 0.92 0.86  CALCIUM 8.3* 8.7* 8.5*   LFT No results for input(s): PROT, ALBUMIN, AST, ALT, ALKPHOS, BILITOT, BILIDIR, IBILI in the last 72 hours. PT/INR No results for input(s): LABPROT, INR in the last 72 hours. Hepatitis  Panel No results for input(s): HEPBSAG, HCVAB, HEPAIGM, HEPBIGM in the last 72 hours.  Studies/Results: No results found.  Scheduled Meds: . benztropine mesylate  1 mg Intravenous BID  . chlorhexidine  15 mL Mouth Rinse BID  . diphenhydrAMINE  12.5 mg Intravenous Q6H  . heparin  5,000 Units Subcutaneous Q8H  . insulin aspart  0-9 Units Subcutaneous Q4H  . insulin detemir  12 Units Subcutaneous QHS  . mouth rinse  15 mL Mouth Rinse q12n4p  . ondansetron (ZOFRAN) IV  4 mg Intravenous Q6H  . pantoprazole (PROTONIX) IV  40 mg Intravenous Q24H   Continuous Infusions: . sodium chloride Stopped (03/25/17 2300)  . sodium chloride Stopped (03/25/17 0830)  . dextrose 5 % and 0.45 % NaCl with KCl 40 mEq/L 75 mL/hr at 03/28/17 1853  . potassium chloride     PRN Meds:.lidocaine (PF), LORazepam, morphine injection, potassium chloride  ASSESMENT:   *  Severe gastroparesis for many years in pt with IDDM.  Gastric pacemaker recommended in 2016 by Dr Derrill Kay, insurance did not approve.   Normal EGD in 2010.   Having s/e of dystonia from Metoclopramide so this on hold.  Benztropine added.  This is 5th admission in last 2 months involving N/V.    *  Depression on several psych meds. Buspar, prozac, Lyrica, prn Ativan.   ? Serotonin syndrome.  Incomplete psych consult noted.    *  IDDM.  Not well controlled, A1Cs of  10 or greater.   *  Hyponatremia.    *  Microcytic anemia.  Chronic issue.  Ferritin 8, but normal iron, TIBC 01/2017.  Daily oral iron on home med list.       PLAN   *  Needs to return to Dr Derrill Kay, gastric motility specialist, at [facility].    *  2 years since last EGD - consider EGD when her acute neurologic problems have resolved.   LFTs and lipase normal consider an ultrasound    *  Would like to d/c morphine but...    *  Consider pt IV iron so she can stop po iron which likely contributes to N/V.     Azucena Freed  03/29/2017, 10:17 AM Pager: [phone]     Attending  physician's note   I have taken an interval history, reviewed the chart and examined the patient. I agree with the Advanced Practitioner's note, impression and recommendations. Pt underwent colonoscopy and EGD by Dr. Alonza Bogus in at [facility] in 09/2014 - attempt to obtain those records. Agree with IV Fe replacement. Minimize opioid pain medications.   Lucio Edward, MD [facility] [phone] Mon-Fri 8a-5p [phone] after 5p, weekends, holidays

## 2017-03-29 NOTE — Clinical Social Work Note (Signed)
Clinical Social Work Assessment  Patient Details  Name: [name] MRN: [id number] Date of Birth: [birth year]  Date of referral:  03/29/17               Reason for consult:  Facility Placement                Permission sought to share information with:  Facility Art therapist granted to share information::  Yes, Verbal Permission Granted  Name::        Agency::  SNF  Relationship::     Contact Information:     Housing/Transportation Living arrangements for the past 2 months:  Single Family Home, [place] of Information:  Patient Patient Interpreter Needed:  None Criminal Activity/Legal Involvement Pertinent to Current Situation/Hospitalization:  No - Comment as needed Significant Relationships:  Adult Children, Siblings Lives with:    Do you feel safe going back to the place where you live?  No Need for family participation in patient care:  No (Coment)  Care giving concerns:  Pt currently very weak- does not have sufficient support to be safe at home.   Social Worker assessment / plan:  CSW spoke with pt concerning PT recommendation for SNF.  Pt just nodded and shook head to answers- very nauseous and unwilling to speak.  CSW explained SNF recommendation- pt acknowledged and states she has been to SNF in the past.  Employment status:  Disabled (Comment on whether or not currently receiving Disability) Insurance information:  Medicaid [place] PT Recommendations:  [place] / Referral to community resources:  [place]  Patient/Family's Response to care:  Pt agreeable to SNF   Patient/Family's Understanding of and Emotional Response to Diagnosis, Current Treatment, and Prognosis:  Unable to assess- pt non verbal during interview due to nausea.  Emotional Assessment Appearance:  Appears stated age Attitude/Demeanor/Rapport:    Affect (typically observed):  Appropriate Orientation:   Oriented to Self, Oriented to Place, Oriented to  Time, Oriented to Situation Alcohol / Substance use:  Not Applicable Psych involvement (Current and /or in the community):  No (Comment)  Discharge Needs  Concerns to be addressed:  Care Coordination Readmission within the last 30 days:  No Current discharge risk:  Physical Impairment Barriers to Discharge:  Continued Medical Work up   Jorge Ny, LCSW 03/29/2017, 4:52 PM

## 2017-03-29 NOTE — Progress Notes (Addendum)
Report given to receiving RN on 5W. Pt will be transferred by bed to room 5W-16. Receiving RN requesting delay of transfer if possible due to multiple discharges

## 2017-03-29 NOTE — NC FL2 (Signed)
[place] LEVEL OF CARE SCREENING TOOL     IDENTIFICATION  Patient Name: [name] Birthdate: 1966-03-24 Sex: female Admission Date (Current Location): 03/24/2017  [facility] and [place] Number:  Herbalist and Address:  [facility] [place] [place] [address] [place], [place] [address]      Provider Number: [id number]  Attending Physician Name and Address:  Caren Griffins, MD  Relative Name and Phone Number:       Current Level of Care: Hospital Recommended Level of Care: [place] Prior Approval Number:    Date Approved/Denied:   PASRR Number: [id number]  Discharge Plan: SNF    Current Diagnoses: Patient Active Problem List   Diagnosis Date Noted  . Serotonin syndrome 03/25/2017  . Tardive dyskinesia   . Tachycardia 03/16/2017  . Diarrhea 03/05/2017  . Hypokalemia 02/06/2017  . Intractable nausea and vomiting 02/05/2017  . Gastroparesis 04/20/2016  . Type 1 diabetes mellitus with ketoacidosis without coma ([place])   . DKA (diabetic ketoacidoses) ([place]) 03/18/2016  . Noncompliance with treatment plan 03/13/2016  . Essential hypertension 01/24/2016  . Leukocytosis 01/24/2016  . Abnormal ECG 11/27/2015  . HLD (hyperlipidemia)   . GERD (gastroesophageal reflux disease)   . Metabolic acidosis, normal anion gap (NAG)   . AKI (acute kidney injury) ([place])   . Anemia, iron deficiency   . DKA, type 1, not at goal [facility])   . Hyponatremia 09/02/2015  . Vitamin B12 deficiency 08/16/2015  . Lactic acidosis 07/29/2015  . Acute pyelonephritis 07/29/2015  . Pyelonephritis   . Hypothermia   . Diabetic gastroparesis ([place])   . Diabetic neuropathy, type I diabetes mellitus ([place]) 05/18/2015  . Anemia 05/18/2015  . Anxiety and depression 05/18/2015  . Nausea & vomiting   . Diabetic ketoacidosis without coma associated with type 1 diabetes mellitus ([place])   . High anion gap metabolic acidosis 08/67/6195    Orientation  RESPIRATION BLADDER Height & Weight     Self, Time, Situation, Place  Normal Continent Weight: 125 lb 14.4 oz (57.1 kg) Height:  5\' 1"  (154.9 cm)  BEHAVIORAL SYMPTOMS/MOOD NEUROLOGICAL BOWEL NUTRITION STATUS      Continent Diet (see DC summary)  AMBULATORY STATUS COMMUNICATION OF NEEDS Skin   Extensive Assist Verbally Normal                       Personal Care Assistance Level of Assistance  Bathing, Dressing Bathing Assistance: Maximum assistance   Dressing Assistance: Maximum assistance     Functional Limitations Info             SPECIAL CARE FACTORS FREQUENCY  PT (By licensed PT), OT (By licensed OT)     PT Frequency: 5/wk OT Frequency: 5/wk            Contractures      Additional Factors Info  Code Status, Allergies, Insulin Sliding Scale Code Status Info: FULL Allergies Info: Anesthetics, Amide, Penicillins, Buprenorphine Hcl, Encainide, Metoclopramide   Insulin Sliding Scale Info: 7/day       Current Medications (03/29/2017):  This is the current hospital active medication list Current Facility-Administered Medications  Medication Dose Route Frequency Provider Last Rate Last Dose  . 0.9 %  sodium chloride infusion   Intravenous Continuous Palumbo, April, MD   Stopped at 03/25/17 2300  . 0.9 %  sodium chloride infusion   Intravenous Continuous Scatliffe, Rise Paganini, MD   Stopped at 03/25/17 0830  . benztropine mesylate (  COGENTIN) injection 1 mg  1 mg Intravenous BID Scatliffe, Rise Paganini, MD   1 mg at 03/29/17 0933  . chlorhexidine (PERIDEX) 0.12 % solution 15 mL  15 mL Mouth Rinse BID Rush Farmer, MD   15 mL at 03/29/17 0940  . dextrose 5 % and 0.45 % NaCl with KCl 40 mEq/L infusion   Intravenous Continuous Juanito Doom, MD 75 mL/hr at 03/29/17 1150    . diphenhydrAMINE (BENADRYL) injection 12.5 mg  12.5 mg Intravenous Q6H Marliss Coots, PA-C   12.5 mg at 03/29/17 0932  . heparin injection 5,000 Units  5,000 Units Subcutaneous Q8H Scatliffe,  Rise Paganini, MD   5,000 Units at 03/29/17 [phone]  . insulin aspart (novoLOG) injection 0-9 Units  0-9 Units Subcutaneous Q4H Sampson Goon, MD   2 Units at 03/29/17 1210  . insulin detemir (LEVEMIR) injection 12 Units  12 Units Subcutaneous QHS Sampson Goon, MD   12 Units at 03/28/17 2311  . lidocaine (PF) (XYLOCAINE) 1 % injection   Other PRN Saverio Danker, PA-C   10 mL at 03/25/17 1506  . LORazepam (ATIVAN) injection 1 mg  1 mg Intravenous Q4H PRN Hosie Poisson, MD   1 mg at 03/27/17 0646  . MEDLINE mouth rinse  15 mL Mouth Rinse q12n4p Rush Farmer, MD   15 mL at 03/28/17 1846  . morphine 4 MG/ML injection 1-2 mg  1-2 mg Intravenous Q4H PRN Caren Griffins, MD   1 mg at 03/29/17 0953  . ondansetron (ZOFRAN) 8 mg in sodium chloride 0.9 % 50 mL IVPB  8 mg Intravenous Q8H Gherghe, Costin M, MD      . pantoprazole (PROTONIX) injection 40 mg  40 mg Intravenous Q24H Scatliffe, Kristen D, MD   40 mg at 03/29/17 0930  . potassium chloride 10 mEq in 100 mL IVPB  10 mEq Intravenous Q1H PRN Scatliffe, Rise Paganini, MD         Discharge Medications: Please see discharge summary for a list of discharge medications.  Relevant Imaging Results:  Relevant Lab Results:   Additional Information SS#: [id number]  Jorge Ny, LCSW

## 2017-03-30 ENCOUNTER — Inpatient Hospital Stay (HOSPITAL_COMMUNITY): Payer: Medicaid Other

## 2017-03-30 LAB — CULTURE, BLOOD (ROUTINE X 2)
Culture: NO GROWTH
Culture: NO GROWTH
Special Requests: ADEQUATE
Special Requests: ADEQUATE

## 2017-03-30 LAB — BASIC METABOLIC PANEL
Anion gap: 7 (ref 5–15)
BUN: <5 mg/dL — ABNORMAL LOW (ref 6–20)
CO2: 22 mmol/L (ref 22–32)
Calcium: 8.7 mg/dL — ABNORMAL LOW (ref 8.9–10.3)
Chloride: 103 mmol/L (ref 101–111)
Creatinine, Ser: 0.84 mg/dL (ref 0.44–1.00)
GFR calc Af Amer: >60 mL/min (ref >60)
GFR calc non Af Amer: >60 mL/min (ref >60)
Glucose, Bld: 121 mg/dL — ABNORMAL HIGH (ref 65–99)
Potassium: 4.5 mmol/L (ref 3.5–5.1)
Sodium: 132 mmol/L — ABNORMAL LOW (ref 135–145)

## 2017-03-30 LAB — CBC
HCT: 28 % — ABNORMAL LOW (ref 36.0–46.0)
Hemoglobin: 8.5 g/dL — ABNORMAL LOW (ref 12.0–15.0)
MCH: 22.1 pg — AB (ref 26.0–34.0)
MCHC: 30.4 g/dL (ref 30.0–36.0)
MCV: 72.9 fL — AB (ref 78.0–100.0)
PLATELETS: 350 10*3/uL (ref 150–400)
RBC: 3.84 MIL/uL — AB (ref 3.87–5.11)
RDW: 17.3 % — AB (ref 11.5–15.5)
WBC: 6.8 10*3/uL (ref 4.0–10.5)

## 2017-03-30 LAB — GLUCOSE, CAPILLARY
GLUCOSE-CAPILLARY: 106 mg/dL — AB (ref 65–99)
GLUCOSE-CAPILLARY: 218 mg/dL — AB (ref 65–99)
Glucose-Capillary: 151 mg/dL — ABNORMAL HIGH (ref 65–99)
Glucose-Capillary: 179 mg/dL — ABNORMAL HIGH (ref 65–99)
Glucose-Capillary: 179 mg/dL — ABNORMAL HIGH (ref 65–99)
Glucose-Capillary: 227 mg/dL — ABNORMAL HIGH (ref 65–99)

## 2017-03-30 MED ORDER — SODIUM CHLORIDE 0.9% FLUSH
10.0000 mL | Freq: Two times a day (BID) | INTRAVENOUS | Status: DC
  Administered 2017-03-31 – 2017-04-03 (×3): 10 mL

## 2017-03-30 MED ORDER — SODIUM CHLORIDE 0.9% FLUSH
10.0000 mL | INTRAVENOUS | Status: DC | PRN
  Administered 2017-03-30 – 2017-04-02 (×4): 10 mL
  Filled 2017-03-30 (×4): qty 40

## 2017-03-30 MED ORDER — GLUCERNA 1.2 CAL PO LIQD
1000.0000 mL | ORAL | Status: DC
  Administered 2017-03-30 – 2017-03-31 (×2): 1000 mL
  Filled 2017-03-30 (×8): qty 1000

## 2017-03-30 MED ORDER — PROMETHAZINE HCL 25 MG RE SUPP
25.0000 mg | Freq: Three times a day (TID) | RECTAL | Status: DC
  Administered 2017-03-30 – 2017-04-01 (×4): 25 mg via RECTAL
  Filled 2017-03-30 (×8): qty 1

## 2017-03-30 MED ORDER — LORAZEPAM 2 MG/ML IJ SOLN
1.0000 mg | Freq: Two times a day (BID) | INTRAMUSCULAR | Status: DC
  Administered 2017-03-31: 1 mg via INTRAVENOUS
  Filled 2017-03-30: qty 1

## 2017-03-30 NOTE — Progress Notes (Signed)
Initial Nutrition Assessment  DOCUMENTATION CODES:  Not applicable  INTERVENTION:  Once post pyloric access achieved. TF via NGT with Glucerna 1.2 at goal rate of 55 ml/h (1320 ml per day)  to provide 1584 kcals, 79 gm protein, 1063 ml free water daily.  NUTRITION DIAGNOSIS:  Inadequate oral intake related to nausea, vomiting, chronic illness Gastroparesis as evidenced by pt report of no meal intake x 4 days.  GOAL:  Patient will meet greater than or equal to 90% of their needs  MONITOR:  Diet advancement, Labs, Weight trends, PO intake, Supplement acceptance, TF tolerance  REASON FOR ASSESSMENT:  Consult Enteral/tube feeding initiation and management  ASSESSMENT:  51 y/o female PMHx IDDM, HTN/HLD, gastroparesis requiring frequent visits to ED, anxiety, depression. Just discharged 9/26 from [facility] after being treated for DKA. Represented to [facility] after Developed rigidity, spasms, cogwheeling and transferred to [facility] due to concern for serotonin syndrome. Additionally, is having severe severe gastroparesis.   Ability to obtain history limited due to the patients level of anxiety surrounding the tube.   Patient says the last time she has a meal was 4 days ago. There is no documented meal intake since the patients admission. Patient was eating well >50% and frequently 100%, when she was admitted 9/21-9/26 at [facility].   Weight history reviewed.  Pt's weights display high variability this admission, ranging 125-143 lbs.  Given variability, will use admit weight 134 lbs 11 oz for dozing weight.  Wt Appear to have a slight trend downward long term.UBW appears to be ~135-145 lbs. She weighed 145 lbs this past March. She weighed 135 lbs at the beginning of August  Per staff report, patient has been unable to tolerate any intake, even clear liquids. Hence, a postpyloric tube was recommended. Despite her talking this over just recently with the provider, she was exceptionally anxious and it took much  encouragement and explanation for her to be agreeable to move forward with tube placement. Placement process significantly lengthened due to her anxiety.   Unfortunately, RD was unable to obtain postpyloric access, likely related to her severe gastroparesis and inhibited opening of postpyloric sphincter. Would recommend IR attempt at placement for better visualization. Left tube at pylorus.   Will order TF recs for once post pyloric access achieved  Physical Exam: WDL, no discernible wasting  Labs: H/H:8.5/28.0, BG 120-220, Na: 132,  A1C 9/23: 10.1 Meds:  Zofran, IVF, PRN Morphine, PRN ativan, ppi  Diet Order:  Diet NPO time specified  Skin:  Reviewed, no issues  Last BM:  10/2  Height:  Ht Readings from Last 1 Encounters:  03/29/17 5\' 1"  (1.549 m)   Weight:  Wt Readings from Last 1 Encounters:  03/30/17 142 lb 14.4 oz (64.8 kg)   Wt Readings from Last 10 Encounters:  03/30/17 142 lb 14.4 oz (64.8 kg)  03/17/17 136 lb 0.4 oz (61.7 kg)  03/05/17 132 lb 4.4 oz (60 kg)  02/01/17 128 lb 1.4 oz (58.1 kg)  01/30/17 135 lb 6.4 oz (61.4 kg)  01/17/17 137 lb 12.8 oz (62.5 kg)  12/31/16 149 lb (67.6 kg)  10/30/16 142 lb (64.4 kg)  10/22/16 147 lb (66.7 kg)  10/18/16 147 lb (66.7 kg)   Ideal Body Weight:  47.73 kg  BMI:  Body mass index is 27 kg/m.  Dosing weight:134 lbs 11 oz (61.23kg) Estimated Nutritional Needs:  Kcal:  1550-1700 kcals (25-28 kcal/kg bw) Protein:  73-86g Pro (1.2-1.4 g/kg bw) Fluid:  1.5-1.7 L fluid ( 60ml/kcal)  EDUCATION NEEDS:  No education needs identified at this time  Burtis Junes RD, LDN, [place] Clinical Nutrition Pager: [phone] 03/30/2017 4:28 PM

## 2017-03-30 NOTE — Progress Notes (Signed)
Cortrak Tube Team Note:  Consult received to place a postpyloric Cortrak feeding tube.   A 10 F Cortrak tube was placed in the L nare and secured with tape at 75cm.  RD tried extensively for >20 min to pass through pylorus, however, ultimately unable. It The tip is currently located at the pylorus. RD ordered xray with hopes it will advance slightly on own. If  Xray shows that tube remains in stomach and does not advance, recommend patient go to IR to have tube advance postpyloric.   Did not attempt to bridle; patient has severe anxiety in regards to the tube.    X-ray is required, abdominal x-ray has been ordered by the Cortrak team. Please confirm tube placement before using the Cortrak tube.   If the tube becomes dislodged please keep the tube and contact the Cortrak team at [email or web address] (password [email or web address]) for replacement.  If after hours and replacement cannot be delayed, place a NG tube and confirm placement with an abdominal x-ray.   Burtis Junes RD, LDN, CNSC Clinical Nutrition Pager: [phone] 03/30/2017 3:07 PM

## 2017-03-30 NOTE — Progress Notes (Signed)
PROGRESS NOTE  [name] BTD:[id number] DOB: 06-May-1966 DOA: 03/24/2017 PCP: [name], MD   LOS: 5 days   Brief Narrative / Interim history: 51 YEAR old lady with prior h/o IDDM, multiple admissions for DKA, gastroparesis comes in for nausea, and vomiting for a few days. She was admitted to [facility] service for possible serotonin syndrome.   Assessment & Plan: Principal Problem:   Serotonin syndrome Active Problems:   Tardive dyskinesia   Acute dystonic reaction -Patient ongoing nausea as well as generalized abdominal pain make neuro exam somewhat difficult, however she appears to be still rigid on exam, consulted neurology, appreciate input -Add Benadryl -Improving,  Intractable nausea vomiting -Due to gastroparesis, last time she was hospitalized she was discharged on Reglan which may explain #1, however she has been on Reglan in the past.  It does not appear that she had serotonin syndrome -Scheduled Zofran for now -Due to persistent symptoms I have consulted gastroenterology, appreciate input.  She has been seen by gastroenterology at [facility] as well -Patient still unable to eat, she has been almost a week without any food, will place cortrack for severe intractable gastroparesis  Anxiety / depression -We will resume buspirone and fluoxetine when she is able to take p.o -Consulted psychiatry for further recommendations regarding oral regimen given concern for dystonic reaction, discussed with Dr. Lenna Sciara 10/5  SIRS -No apparent source of infection, discontinue vancomycin, cultures have remained negative, discontinued cefepime as well.  Final cultures without growth  Insulin-dependent diabetes mellitus with recurrent DKA's -? Non compliance, DKA has now resolved  HTN -hold Lisinopril until able to have consistent po intake   DVT prophylaxis: heparin Code Status: Full code Family Communication: no family at bedside Disposition Plan: home when ready   Consultants:     Neurology   GI  Psychiatry  Procedures:   None   Antimicrobials:  Vancomycin 10/1 >> 10/3  Cefepime  10/1 >> 10/4  Subjective: -Her nausea and vomiting is unchanged, she is retching even at the thought of food  Objective: Vitals:   03/29/17 1612 03/29/17 2029 03/30/17 0425 03/30/17 0500  BP: 112/69 (!) 148/62 (!) 160/80   Pulse: (!) 106 (!) 103 (!) 101   Resp: 19 20 20    Temp: 98.1 F (36.7 C) 98.9 F (37.2 C) 98.9 F (37.2 C)   TempSrc: Oral Oral Oral   SpO2: 99% 98% 100%   Weight: 62.7 kg (138 lb 3.2 oz)   64.8 kg (142 lb 14.4 oz)  Height: 5\' 1"  (1.549 m)       Intake/Output Summary (Last 24 hours) at 03/30/17 1422 Last data filed at 03/30/17 1252  Gross per 24 hour  Intake          1991.25 ml  Output             1750 ml  Net           241.25 ml   Filed Weights   03/28/17 0300 03/29/17 1612 03/30/17 0500  Weight: 57.1 kg (125 lb 14.4 oz) 62.7 kg (138 lb 3.2 oz) 64.8 kg (142 lb 14.4 oz)    Examination:  Constitutional: No apparent distress Respiratory: CTA Cardiovascular: RRR  Data Reviewed: I have independently reviewed following labs and imaging studies   CBC:  Recent Labs Lab 03/24/17 2335 03/25/17 0623 03/26/17 0408 03/29/17 0617 03/30/17 1028  WBC 6.7 10.3 8.2 7.5 6.8  NEUTROABS 5.2  --   --   --   --   HGB  9.5* 10.8* 8.1* 8.9* 8.5*  HCT 31.0* 35.6* 27.2* 30.3* 28.0*  MCV 72.8* 73.4* 73.5* 73.0* 72.9*  PLT 467* 375 395 368 400   Basic Metabolic Panel:  Recent Labs Lab 03/26/17 0408 03/28/17 0402 03/28/17 0600 03/29/17 0617 03/30/17 1028  NA 137 129* 131* 129* 132*  K 3.5 6.1* 4.3 4.6 4.5  CL 106 100* 100* 102 103  CO2 25 22 24 22 22   GLUCOSE 109* 387* 176* 145* 121*  BUN 7 <5* <5* <5* <5*  CREATININE 0.79 0.82 0.92 0.86 0.84  CALCIUM 8.4* 8.3* 8.7* 8.5* 8.7*  MG 1.7  --   --   --   --   PHOS 2.6  --   --   --   --    GFR: Estimated Creatinine Clearance: 69.8 mL/min (by C-G formula based on SCr of 0.84 mg/dL). Liver  Function Tests:  Recent Labs Lab 03/24/17 2335  AST 32  ALT 24  ALKPHOS 91  BILITOT 1.0  PROT 8.2*  ALBUMIN 4.2   No results for input(s): LIPASE, AMYLASE in the last 168 hours. No results for input(s): AMMONIA in the last 168 hours. Coagulation Profile: No results for input(s): INR, PROTIME in the last 168 hours. Cardiac Enzymes:  Recent Labs Lab 03/24/17 2335  CKTOTAL 82  TROPONINI <0.03   BNP (last 3 results) No results for input(s): PROBNP in the last 8760 hours. HbA1C: No results for input(s): HGBA1C in the last 72 hours. CBG:  Recent Labs Lab 03/29/17 2028 03/30/17 0053 03/30/17 0423 03/30/17 0847 03/30/17 1328  GLUCAP 208* 227* 179* 106* 151*   Lipid Profile: No results for input(s): CHOL, HDL, LDLCALC, TRIG, CHOLHDL, LDLDIRECT in the last 72 hours. Thyroid Function Tests: No results for input(s): TSH, T4TOTAL, FREET4, T3FREE, THYROIDAB in the last 72 hours. Anemia Panel: No results for input(s): VITAMINB12, FOLATE, FERRITIN, TIBC, IRON, RETICCTPCT in the last 72 hours. Urine analysis:    Component Value Date/Time   COLORURINE YELLOW 03/24/2017 [place] 03/24/2017 2359   LABSPEC 1.020 03/24/2017 2359   PHURINE 7.0 03/24/2017 2359   GLUCOSEU >=500 (A) 03/24/2017 2359   HGBUR TRACE (A) 03/24/2017 2359   BILIRUBINUR NEGATIVE 03/24/2017 2359   BILIRUBINUR neg 01/17/2017 1533   KETONESUR 15 (A) 03/24/2017 2359   PROTEINUR 100 (A) 03/24/2017 2359   UROBILINOGEN 0.2 01/17/2017 1533   UROBILINOGEN 0.2 04/27/2015 1608   NITRITE NEGATIVE 03/24/2017 2359   LEUKOCYTESUR NEGATIVE 03/24/2017 2359   Sepsis Labs: Invalid input(s): PROCALCITONIN, LACTICIDVEN  Recent Results (from the past 240 hour(s))  MRSA PCR Screening     Status: None   Collection Time: 03/25/17  4:21 AM  Result Value Ref Range Status   MRSA by PCR NEGATIVE NEGATIVE Final    Comment:        The GeneXpert MRSA Assay (FDA approved for NASAL specimens only), is one  component of a comprehensive MRSA colonization surveillance program. It is not intended to diagnose MRSA infection nor to guide or monitor treatment for MRSA infections.   Culture, blood (routine x 2)     Status: None   Collection Time: 03/25/17 11:17 AM  Result Value Ref Range Status   Specimen Description BLOOD LEFT WRIST  Final   Special Requests IN PEDIATRIC BOTTLE Blood Culture adequate volume  Final   Culture NO GROWTH 5 DAYS  Final   Report Status 03/30/2017 FINAL  Final  Culture, blood (routine x 2)     Status: None  Collection Time: 03/25/17 11:49 AM  Result Value Ref Range Status   Specimen Description BLOOD LEFT HAND  Final   Special Requests IN PEDIATRIC BOTTLE Blood Culture adequate volume  Final   Culture NO GROWTH 5 DAYS  Final   Report Status 03/30/2017 FINAL  Final      Radiology Studies: No results found.   Scheduled Meds: . benztropine mesylate  1 mg Intravenous BID  . chlorhexidine  15 mL Mouth Rinse BID  . diphenhydrAMINE  12.5 mg Intravenous Q6H  . heparin  5,000 Units Subcutaneous Q8H  . insulin aspart  0-9 Units Subcutaneous Q4H  . insulin detemir  12 Units Subcutaneous QHS  . mouth rinse  15 mL Mouth Rinse q12n4p  . pantoprazole (PROTONIX) IV  40 mg Intravenous Q24H  . sodium chloride flush  10-40 mL Intracatheter Q12H   Continuous Infusions: . sodium chloride Stopped (03/25/17 2300)  . sodium chloride 1,000 mL (03/30/17 1252)  . dextrose 5 % and 0.45 % NaCl with KCl 40 mEq/L 75 mL/hr at 03/30/17 0114  . ondansetron (ZOFRAN) IV Stopped (03/30/17 7209)  . potassium chloride      Marzetta Board, MD, PhD [facility] Pager [phone]  If 7PM-7AM, please contact night-coverage [email or web address] Password [email or web address] 03/30/2017, 2:22 PM

## 2017-03-30 NOTE — Progress Notes (Signed)
     [facility] Progress Note  Chief Complaint:    Intractable nausea /vomiting  Subjective: Vomiting earlier this am. Says she hasn't had anything PO this am.   Objective:  Vital signs in last 24 hours: Temp:  [98.1 F (36.7 C)-98.9 F (37.2 C)] 98.9 F (37.2 C) (10/06 0425) Pulse Rate:  [101-106] 101 (10/06 0425) Resp:  [14-20] 20 (10/06 0425) BP: (108-160)/(61-80) 160/80 (10/06 0425) SpO2:  [97 %-100 %] 100 % (10/06 0425) Weight:  [138 lb 3.2 oz (62.7 kg)-142 lb 14.4 oz (64.8 kg)] 142 lb 14.4 oz (64.8 kg) (10/06 0500) Last BM Date: 03/26/17 General:   Alert, well-developed, AA female  in NAD EENT:  Normal hearing, non icteric sclera, conjunctive pink.  Heart:  Regular rate and rhythm; no murmurs. no lower extremity edema Pulm: Normal respiratory effort, lungs CTA bilaterally without wheezes or crackles. Abdomen:  Soft, nondistended, nontender.  Normal bowel sounds, no masses felt. No hepatomegaly.    Neurologic:  Alert and  oriented x4;  grossly normal neurologically. Psych:  Pleasant, cooperative.  Normal mood and affect.   Intake/Output from previous day: 10/05 0701 - 10/06 0700 In: 1400.6 [I.V.:1350.6; IV Piggyback:50] Out: 800 [Urine:800] Intake/Output this shift: Total I/O In: 741.3 [I.V.:741.3] Out: 1550 [Urine:1550]  Lab Results:  Recent Labs  03/29/17 0617 03/30/17 1028  WBC 7.5 6.8  HGB 8.9* 8.5*  HCT 30.3* 28.0*  PLT 368 350   BMET  Recent Labs  03/28/17 0600 03/29/17 0617 03/30/17 1028  NA 131* 129* 132*  K 4.3 4.6 4.5  CL 100* 102 103  CO2 24 22 22   GLUCOSE 176* 145* 121*  BUN <5* <5* <5*  CREATININE 0.92 0.86 0.84  CALCIUM 8.7* 8.5* 8.7*   ASSESSMENT / PLAN:   Severe diabetic gastroparesis. Numerous admissions for DKA / nausea and vomiting. Unfortunately she cannot take Reglan, at least for time being due to dystonia. Currently getting IV Zofran Q 8 hours.  -Will alternate IV Zofran and phenergan supp on scheduled  dosing so that she gets anti-emetic Q4 hours -Try scheduled anxiolytic. Changing Ativan to 1 mg BID for now.  -Options very limited. If not better soon need to consider inpatient transfer to [facility]. Dr. Derrill Kay followed her for severe gastroparesis in 2016. Now has Medicaid, wonder if it will pay for a gastric pacemaker.   Principal Problem:   Serotonin syndrome Active Problems:   Tardive dyskinesia    LOS: 5 days   Tye Savoy ,NP 03/30/2017, 1:05 PM Pager number [phone]     Attending physician's note   I have taken an interval history, reviewed the chart and examined the patient. I agree with the Advanced Practitioner's note, impression and recommendations.  Severe diabetic gastroparesis, nausea and vomiting persist. Trial of adding Phenergan supp and Ativan scheduled dosing. If not improving consider inpatient transfer to [facility]. Colonoscopy and EGD performed at [facility] [place] in 09/2014. Attempt to obtain records.   Lucio Edward, MD [facility] [phone] Mon-Fri 8a-5p [phone] after 5p, weekends, holidays

## 2017-03-31 DIAGNOSIS — Z789 Other specified health status: Secondary | ICD-10-CM

## 2017-03-31 DIAGNOSIS — F339 Major depressive disorder, recurrent, unspecified: Secondary | ICD-10-CM

## 2017-03-31 DIAGNOSIS — R110 Nausea: Secondary | ICD-10-CM

## 2017-03-31 DIAGNOSIS — R112 Nausea with vomiting, unspecified: Secondary | ICD-10-CM

## 2017-03-31 DIAGNOSIS — F419 Anxiety disorder, unspecified: Secondary | ICD-10-CM

## 2017-03-31 DIAGNOSIS — G2579 Other drug induced movement disorders: Secondary | ICD-10-CM

## 2017-03-31 DIAGNOSIS — E1110 Type 2 diabetes mellitus with ketoacidosis without coma: Secondary | ICD-10-CM

## 2017-03-31 DIAGNOSIS — K3184 Gastroparesis: Secondary | ICD-10-CM

## 2017-03-31 LAB — BASIC METABOLIC PANEL
Anion gap: 6 (ref 5–15)
BUN: 6 mg/dL (ref 6–20)
CHLORIDE: 103 mmol/L (ref 101–111)
CO2: 22 mmol/L (ref 22–32)
Calcium: 8.1 mg/dL — ABNORMAL LOW (ref 8.9–10.3)
Creatinine, Ser: 0.94 mg/dL (ref 0.44–1.00)
GFR calc non Af Amer: >60 mL/min (ref >60)
Glucose, Bld: 262 mg/dL — ABNORMAL HIGH (ref 65–99)
POTASSIUM: 4.7 mmol/L (ref 3.5–5.1)
SODIUM: 131 mmol/L — AB (ref 135–145)

## 2017-03-31 LAB — GLUCOSE, CAPILLARY
GLUCOSE-CAPILLARY: 175 mg/dL — AB (ref 65–99)
GLUCOSE-CAPILLARY: 194 mg/dL — AB (ref 65–99)
Glucose-Capillary: 204 mg/dL — ABNORMAL HIGH (ref 65–99)
Glucose-Capillary: 317 mg/dL — ABNORMAL HIGH (ref 65–99)
Glucose-Capillary: 346 mg/dL — ABNORMAL HIGH (ref 65–99)

## 2017-03-31 LAB — CBC
HEMATOCRIT: 25.3 % — AB (ref 36.0–46.0)
Hemoglobin: 7.5 g/dL — ABNORMAL LOW (ref 12.0–15.0)
MCH: 21.9 pg — ABNORMAL LOW (ref 26.0–34.0)
MCHC: 29.6 g/dL — ABNORMAL LOW (ref 30.0–36.0)
MCV: 73.8 fL — AB (ref 78.0–100.0)
Platelets: 306 10*3/uL (ref 150–400)
RBC: 3.43 MIL/uL — AB (ref 3.87–5.11)
RDW: 17.1 % — ABNORMAL HIGH (ref 11.5–15.5)
WBC: 8.4 10*3/uL (ref 4.0–10.5)

## 2017-03-31 MED ORDER — ERYTHROMYCIN ETHYLSUCCINATE 400 MG/5ML PO SUSR
400.0000 mg | Freq: Three times a day (TID) | ORAL | Status: DC
  Administered 2017-03-31 – 2017-04-02 (×7): 400 mg
  Filled 2017-03-31 (×10): qty 5

## 2017-03-31 MED ORDER — CLONAZEPAM 0.125 MG PO TBDP
0.5000 mg | ORAL_TABLET | Freq: Two times a day (BID) | ORAL | Status: DC
  Administered 2017-03-31 – 2017-04-03 (×7): 0.5 mg
  Filled 2017-03-31 (×7): qty 4

## 2017-03-31 MED ORDER — PROCHLORPERAZINE EDISYLATE 5 MG/ML IJ SOLN
10.0000 mg | Freq: Four times a day (QID) | INTRAMUSCULAR | Status: DC | PRN
  Administered 2017-04-02 (×2): 10 mg via INTRAVENOUS
  Filled 2017-03-31 (×2): qty 2

## 2017-03-31 NOTE — Progress Notes (Signed)
     [facility] Progress Note  Chief Complaint:    Nausea / vomiting.   Subjective: Enteral feeding tube is bothering her, wants to know how long it has to stay in  Objective:  Vital signs in last 24 hours: Temp:  [98.6 F (37 C)-99.4 F (37.4 C)] 98.6 F (37 C) (10/07 0501) Pulse Rate:  [93-107] 93 (10/07 0501) Resp:  [16-18] 16 (10/07 0501) BP: (119-140)/(51-71) 132/67 (10/07 0501) SpO2:  [99 %-100 %] 100 % (10/07 0501) Weight:  [143 lb (64.9 kg)] 143 lb (64.9 kg) (10/07 0501) Last BM Date: 03/26/17 General:   Alert, well-developed, black femae  in NAD EENT:  Normal hearing, non icteric sclera, conjunctive pink.  Heart:  Regular rate and rhythm; no murmurs. No lower extremity edema Pulm: Normal respiratory effort Abdomen:  Soft, nondistended, nontender.  Normal bowel sounds, no masses felt. No hepatomegaly.    Neurologic:  Alert and  oriented x4;  grossly normal neurologically. Psych:   cooperative.  Normal mood and affect.   Intake/Output from previous day: 10/06 0701 - 10/07 0700 In: 5334.9 [I.V.:4680; NG/GT:550.9; IV Piggyback:104] Out: 4375 [Urine:4375] Intake/Output this shift: Total I/O In: 1949.8 [I.V.:1714.6; NG/GT:185.2; IV Piggyback:50] Out: -   Lab Results:  Recent Labs  03/29/17 0617 03/30/17 1028 03/31/17 0313  WBC 7.5 6.8 8.4  HGB 8.9* 8.5* 7.5*  HCT 30.3* 28.0* 25.3*  PLT 368 350 306   BMET  Recent Labs  03/29/17 0617 03/30/17 1028 03/31/17 0313  NA 129* 132* 131*  K 4.6 4.5 4.7  CL 102 103 103  CO2 22 22 22   GLUCOSE 145* 121* 262*  BUN <5* <5* 6  CREATININE 0.86 0.84 0.94  CALCIUM 8.5* 8.7* 8.1*    Dg Abd Portable 1v  Result Date: 03/30/2017 CLINICAL DATA:  Encounter for feeding tube placement. EXAM: PORTABLE ABDOMEN - 1 VIEW COMPARISON:  None. FINDINGS: Enteric feeding tube passes well below the diaphragm. Metallic tip projects in the right upper quadrant, likely within the first portion of the duodenum. Normal  bowel gas pattern.  Lung bases are clear. IMPRESSION: Enteric feeding tube tip projects in the region of the first portion of the duodenum. Electronically Signed   By: Lajean Manes M.D.   On: 03/30/2017 15:43    ASSESSMENT / PLAN:   51 yo female with severe diabetic gastroparesis. Treatment complicated by acute dystonic reaction. Treatment options are now limited. Further complicated by inability to take her psych meds because of the dystonia. We put her on scheduled zofran and phenergan as well as ativan yesterday. The ativan is helping her nausea. She had a small bowel feeding tube placed this am and then given clear liquids per her request. So far she hasn't vomited any of the clears. She may need to be transferred to [facility] at some point.    Principal Problem:   Serotonin syndrome Active Problems:   Tardive dyskinesia    LOS: 6 days   Tye Savoy ,NP 03/31/2017, 1:59 PM Pager number [phone]    Attending physician's note   I have taken an interval history, reviewed the chart and examined the patient. I agree with the Advanced Practitioner's note, impression and recommendations. Severe gastroparesis with persistent N/V. Enteral feedings and clears po for now. Continue current medications.   Lucio Edward, MD [facility] [phone] Mon-Fri 8a-5p [phone] after 5p, weekends, holidays

## 2017-03-31 NOTE — Progress Notes (Signed)
PROGRESS NOTE  [name] ZHG:[id number] DOB: [birth year] DOA: 03/24/2017 PCP: [name], MD   LOS: 6 days   Brief Narrative / Interim history: 51 YEAR old lady with prior h/o IDDM, multiple admissions for DKA, gastroparesis comes in for nausea, and vomiting for a few days. She was admitted to [facility] service for possible serotonin syndrome.   Assessment & Plan: Principal Problem:   Serotonin syndrome Active Problems:   Tardive dyskinesia   Acute dystonic reaction -Patient ongoing nausea as well as generalized abdominal pain make neuro exam somewhat difficult, however she appears to be still rigid on exam, consulted neurology, appreciate input -Improving, she was on Benadryl but will discontinue today due to concern for excessive sedation  Intractable nausea vomiting due to severe gastroparesis -last time she was hospitalized she was discharged on Reglan which may explain #1 -Scheduled Zofran for now -Due to persistent symptoms I have consulted gastroenterology, appreciate input.  She has been seen by gastroenterology at [facility] as well -cortrack placed 10/6 for severe intractable gastroparesis, after placement patient wants to try clears.  Will allow that today, add Compazine, add erythromycin via tube as well as clonazepam instead of Ativan  Anxiety / depression -Consulted psychiatry for further recommendations, discussed with Dr. Lenna Sciara 10/5, recommends clonazepam, Cogentin and Benadryl as needed, recommending against antipsychotic medications at this time  SIRS -No apparent source of infection, discontinue vancomycin, cultures have remained negative, discontinued cefepime as well.  Final cultures without growth  Insulin-dependent diabetes mellitus with recurrent DKA's -? Non compliance, DKA has now resolved  HTN -hold Lisinopril until able to have consistent po intake   DVT prophylaxis: heparin Code Status: Full code Family Communication: no family at  bedside Disposition Plan: home when ready   Consultants:   Neurology   GI  Psychiatry  Procedures:   None   Antimicrobials:  Vancomycin 10/1 >> 10/3  Cefepime  10/1 >> 10/4  Subjective: -Still feels poorly, however wants to try clear liquid diet, no longer has vomiting  Objective: Vitals:   03/30/17 0500 03/30/17 1509 03/30/17 2054 03/31/17 0501  BP:  140/71 (!) 119/51 132/67  Pulse:  100 (!) 107 93  Resp:  18 16 16   Temp:  98.7 F (37.1 C) 99.4 F (37.4 C) 98.6 F (37 C)  TempSrc:  Oral Oral Oral  SpO2:  100% 99% 100%  Weight: 64.8 kg (142 lb 14.4 oz)   64.9 kg (143 lb)  Height:        Intake/Output Summary (Last 24 hours) at 03/31/17 1439 Last data filed at 03/31/17 1353  Gross per 24 hour  Intake          5685.08 ml  Output             2825 ml  Net          2860.08 ml   Filed Weights   03/29/17 1612 03/30/17 0500 03/31/17 0501  Weight: 62.7 kg (138 lb 3.2 oz) 64.8 kg (142 lb 14.4 oz) 64.9 kg (143 lb)    Examination:  Constitutional: NAD, calm, comfortable Eyes: lids and conjunctivae normal ENMT: Mucous membranes are moist.  Respiratory: clear to auscultation bilaterally, no wheezing, no crackles.  Cardiovascular: Regular rate and rhythm, no murmurs / rubs / gallops. No LE edema.  Abdomen: no tenderness. Bowel sounds positive.  Neurologic: non focal   Data Reviewed: I have independently reviewed following labs and imaging studies   CBC:  Recent Labs Lab 03/24/17 2335 03/25/17 1962 03/26/17 0408  03/29/17 0617 03/30/17 1028 03/31/17 0313  WBC 6.7 10.3 8.2 7.5 6.8 8.4  NEUTROABS 5.2  --   --   --   --   --   HGB 9.5* 10.8* 8.1* 8.9* 8.5* 7.5*  HCT 31.0* 35.6* 27.2* 30.3* 28.0* 25.3*  MCV 72.8* 73.4* 73.5* 73.0* 72.9* 73.8*  PLT 467* 375 395 368 350 621   Basic Metabolic Panel:  Recent Labs Lab 03/26/17 0408 03/28/17 0402 03/28/17 0600 03/29/17 0617 03/30/17 1028 03/31/17 0313  NA 137 129* 131* 129* 132* 131*  K 3.5 6.1* 4.3  4.6 4.5 4.7  CL 106 100* 100* 102 103 103  CO2 25 22 24 22 22 22   GLUCOSE 109* 387* 176* 145* 121* 262*  BUN 7 <5* <5* <5* <5* 6  CREATININE 0.79 0.82 0.92 0.86 0.84 0.94  CALCIUM 8.4* 8.3* 8.7* 8.5* 8.7* 8.1*  MG 1.7  --   --   --   --   --   PHOS 2.6  --   --   --   --   --    GFR: Estimated Creatinine Clearance: 62.4 mL/min (by C-G formula based on SCr of 0.94 mg/dL). Liver Function Tests:  Recent Labs Lab 03/24/17 2335  AST 32  ALT 24  ALKPHOS 91  BILITOT 1.0  PROT 8.2*  ALBUMIN 4.2   No results for input(s): LIPASE, AMYLASE in the last 168 hours. No results for input(s): AMMONIA in the last 168 hours. Coagulation Profile: No results for input(s): INR, PROTIME in the last 168 hours. Cardiac Enzymes:  Recent Labs Lab 03/24/17 2335  CKTOTAL 82  TROPONINI <0.03   BNP (last 3 results) No results for input(s): PROBNP in the last 8760 hours. HbA1C: No results for input(s): HGBA1C in the last 72 hours. CBG:  Recent Labs Lab 03/30/17 2053 03/31/17 0044 03/31/17 0456 03/31/17 0803 03/31/17 1156  GLUCAP 218* 317* 204* 175* 194*   Lipid Profile: No results for input(s): CHOL, HDL, LDLCALC, TRIG, CHOLHDL, LDLDIRECT in the last 72 hours. Thyroid Function Tests: No results for input(s): TSH, T4TOTAL, FREET4, T3FREE, THYROIDAB in the last 72 hours. Anemia Panel: No results for input(s): VITAMINB12, FOLATE, FERRITIN, TIBC, IRON, RETICCTPCT in the last 72 hours. Urine analysis:    Component Value Date/Time   COLORURINE YELLOW 03/24/2017 [place] 03/24/2017 2359   LABSPEC 1.020 03/24/2017 2359   PHURINE 7.0 03/24/2017 2359   GLUCOSEU >=500 (A) 03/24/2017 2359   HGBUR TRACE (A) 03/24/2017 2359   BILIRUBINUR NEGATIVE 03/24/2017 2359   BILIRUBINUR neg 01/17/2017 1533   KETONESUR 15 (A) 03/24/2017 2359   PROTEINUR 100 (A) 03/24/2017 2359   UROBILINOGEN 0.2 01/17/2017 1533   UROBILINOGEN 0.2 04/27/2015 1608   NITRITE NEGATIVE 03/24/2017 2359    LEUKOCYTESUR NEGATIVE 03/24/2017 2359   Sepsis Labs: Invalid input(s): PROCALCITONIN, LACTICIDVEN  Recent Results (from the past 240 hour(s))  MRSA PCR Screening     Status: None   Collection Time: 03/25/17  4:21 AM  Result Value Ref Range Status   MRSA by PCR NEGATIVE NEGATIVE Final    Comment:        The GeneXpert MRSA Assay (FDA approved for NASAL specimens only), is one component of a comprehensive MRSA colonization surveillance program. It is not intended to diagnose MRSA infection nor to guide or monitor treatment for MRSA infections.   Culture, blood (routine x 2)     Status: None   Collection Time: 03/25/17 11:17 AM  Result Value Ref Range  Status   Specimen Description BLOOD LEFT WRIST  Final   Special Requests IN PEDIATRIC BOTTLE Blood Culture adequate volume  Final   Culture NO GROWTH 5 DAYS  Final   Report Status 03/30/2017 FINAL  Final  Culture, blood (routine x 2)     Status: None   Collection Time: 03/25/17 11:49 AM  Result Value Ref Range Status   Specimen Description BLOOD LEFT HAND  Final   Special Requests IN PEDIATRIC BOTTLE Blood Culture adequate volume  Final   Culture NO GROWTH 5 DAYS  Final   Report Status 03/30/2017 FINAL  Final      Radiology Studies: Dg Abd Portable 1v  Result Date: 03/30/2017 CLINICAL DATA:  Encounter for feeding tube placement. EXAM: PORTABLE ABDOMEN - 1 VIEW COMPARISON:  None. FINDINGS: Enteric feeding tube passes well below the diaphragm. Metallic tip projects in the right upper quadrant, likely within the first portion of the duodenum. Normal bowel gas pattern.  Lung bases are clear. IMPRESSION: Enteric feeding tube tip projects in the region of the first portion of the duodenum. Electronically Signed   By: Lajean Manes M.D.   On: 03/30/2017 15:43     Scheduled Meds: . benztropine mesylate  1 mg Intravenous BID  . chlorhexidine  15 mL Mouth Rinse BID  . clonazepam  0.5 mg Per Tube BID  . erythromycin  400 mg Per Tube  Q8H  . heparin  5,000 Units Subcutaneous Q8H  . insulin aspart  0-9 Units Subcutaneous Q4H  . insulin detemir  12 Units Subcutaneous QHS  . mouth rinse  15 mL Mouth Rinse q12n4p  . pantoprazole (PROTONIX) IV  40 mg Intravenous Q24H  . promethazine  25 mg Rectal Q8H  . sodium chloride flush  10-40 mL Intracatheter Q12H   Continuous Infusions: . sodium chloride 1,000 mL (03/31/17 1352)  . sodium chloride 1,000 mL (03/30/17 1252)  . dextrose 5 % and 0.45 % NaCl with KCl 40 mEq/L 75 mL/hr at 03/31/17 0621  . feeding supplement (GLUCERNA 1.2 CAL) 1,000 mL (03/31/17 1023)  . ondansetron (ZOFRAN) IV Stopped (03/31/17 1408)  . potassium chloride      Marzetta Board, MD, PhD [facility] Pager [phone]  If 7PM-7AM, please contact night-coverage [email or web address] Password [email or web address] 03/31/2017, 2:39 PM

## 2017-03-31 NOTE — Consult Note (Signed)
[facility] Consult   Reason for Consult:  Medication management for dystonia Referring Physician:  Dr. Cruzita Lederer Patient Identification: [name] MRN:  [id number] Principal Diagnosis: Serotonin syndrome Diagnosis:   Patient Active Problem List   Diagnosis Date Noted  . Serotonin syndrome [G25.79] 03/25/2017  . Tardive dyskinesia [G24.01]   . Tachycardia [R00.0] 03/16/2017  . Diarrhea [R19.7] 03/05/2017  . Hypokalemia [E87.6] 02/06/2017  . Intractable nausea and vomiting [R11.2] 02/05/2017  . Gastroparesis [K31.84] 04/20/2016  . Type 1 diabetes mellitus with ketoacidosis without coma ([place]) [E10.10]   . DKA (diabetic ketoacidoses) ([place]) [E13.10] 03/18/2016  . Noncompliance with treatment plan [Z91.11] 03/13/2016  . Essential hypertension [I10] 01/24/2016  . Leukocytosis [D72.829] 01/24/2016  . Abnormal ECG [R94.31] 11/27/2015  . HLD (hyperlipidemia) [E78.5]   . GERD (gastroesophageal reflux disease) [K21.9]   . Metabolic acidosis, normal anion gap (NAG) [E87.2]   . AKI (acute kidney injury) ([place]) [N17.9]   . Anemia, iron deficiency [D50.9]   . DKA, type 1, not at goal [facility]) [E10.10]   . Hyponatremia [E87.1] 09/02/2015  . Vitamin B12 deficiency [E53.8] 08/16/2015  . Lactic acidosis [E87.2] 07/29/2015  . Acute pyelonephritis [N10] 07/29/2015  . Pyelonephritis [N12]   . Hypothermia [T68.XXXA]   . Diabetic gastroparesis ([place]) [E11.43, K31.84]   . Diabetic neuropathy, type I diabetes mellitus ([place]) [E10.40] 05/18/2015  . Anemia [D64.9] 05/18/2015  . Anxiety and depression [F41.9, F32.9] 05/18/2015  . Nausea & vomiting [R11.2]   . Diabetic ketoacidosis without coma associated with type 1 diabetes mellitus ([place]) [E10.10]   . High anion gap metabolic acidosis [L87.5] 02/13/2013    Total Time spent with patient: 1 hour  Subjective:   [name] is a 51 y.o. female patient admitted with acute dystonia, nausea, and vomiting.  HPI: [name] is a 51 years  old female admitted to the [facility] with diabetic ketoacidosis, gastroparesis, nausea and vomiting for the last few days. Patient was relocated to medical floor from the intensive care unit. Patient denies suicidal ideation, homicidal ideation and evidence of psychosis. Patient reported she has been suffering with anxiety disorder receiving BuSpar and also has gastroparesis secondary to diabetes and having difficulty swallowing along with nausea and vomiting. Patient has acute dystonia secondary to anti vomiting medication Reglan and also possible serotonin syndrome secondary to BuSpar. Patient seems to be slowly responding positively for her current medication management and no symptoms of acute dystonia today and able to drink and eat soft diet without nausea. Patient does not meet criteria for acute psychiatric hospitalizon.  Past Psychiatric History: Anxiety and depression, Has no history of acute psychiatric hospitalization. Patient never been treated by psychiatric services.   Risk to Self: Is patient at risk for suicide?: No Risk to Others:   Prior Inpatient Therapy:   Prior Outpatient Therapy:    Past Medical History:  Past Medical History:  Diagnosis Date  . Depression   . Diabetes mellitus without complication ([place])   . Essential hypertension   . Gastroparesis   . GERD (gastroesophageal reflux disease)   . HLD (hyperlipidemia)     Past Surgical History:  Procedure Laterality Date  . COLONOSCOPY  09/27/2014   at [facility]  . ESOPHAGOGASTRODUODENOSCOPY  09/27/2014   at [facility], Dr Rolan Lipa. biospy neg for celiac, neg for H pylori.   . EYE SURGERY    . gailstones    . IR FLUORO GUIDE CV LINE RIGHT  02/01/2017  . IR FLUORO GUIDE CV LINE RIGHT  03/06/2017  . IR FLUORO GUIDE CV LINE RIGHT  03/25/2017  . IR GENERIC HISTORICAL  01/24/2016   IR FLUORO GUIDE CV LINE RIGHT 01/24/2016 Darrell K Allred, PA-C [facility]-INTERV RAD  . IR GENERIC HISTORICAL  01/24/2016   IR US GUIDE VASC ACCESS  RIGHT 01/24/2016 Darrell K Allred, PA-C [facility]INTER[facility]  . IR US GUIDE VASC ACCESS RIGHT  02/01/2017  . IR US GUIDE VASC ACCESS RIGHT  03/06/2017  . IR US GUIDE VASC ACCESS RIGHT  03/25/2017  . POSTERIOR VITRECTOMY AND MEMBRANE PEEL-LEFT EYE  09/28/2002  . POSTERIOR VITRECTOMY AND MEMBRANE PEEL-RIGHT EYE  03/16/2002  . RETINAL DETACHMENT SURGERY     Family History:  Family History  Problem Relation Age of Onset  . Cystic fibrosis Mother   . Hypertension Father   . Diabetes Brother   . Hypertension Maternal Grandmother    Family Psychiatric  History: Unknown Social History:  History  Alcohol Use No     History  Drug Use No    Social History   Social History  . Marital status: Single    Spouse name: N/A  . Number of children: N/A  . Years of education: N/A   Social History Main Topics  . Smoking status: Never Smoker  . Smokeless tobacco: Never Used  . Alcohol use No  . Drug use: No  . Sexual activity: Not Currently   Other Topics Concern  . None   Social History Narrative  . None   Additional Social History:    Allergies:   Allergies  Allergen Reactions  . Anesthetics, Amide Nausea And Vomiting  . Penicillins Diarrhea and Nausea And Vomiting    Has patient had a PCN reaction causing immediate rash, facial/tongue/throat swelling, SOB or lightheadedness with hypotension: No Has patient had a PCN reaction causing severe rash involving mucus membranes or skin necrosis: No Has patient had a PCN reaction that required hospitalization No Has patient had a PCN reaction occurring within the last 10 years: Yes  If all of the above answers are "NO", then may proceed with Cephalosporin use.   . Buprenorphine Hcl Rash  . Encainide Nausea And Vomiting  . Metoclopramide Other (See Comments)    Dystonia, muscle rigidity.     Labs:  Results for orders placed or performed during the hospital encounter of 03/24/17 (from the past 48 hour(s))  Glucose, capillary     Status: Abnormal    Collection Time: 03/29/17  4:11 PM  Result Value Ref Range   Glucose-Capillary 225 (H) 65 - 99 mg/dL  Glucose, capillary     Status: Abnormal   Collection Time: 03/29/17  8:28 PM  Result Value Ref Range   Glucose-Capillary 208 (H) 65 - 99 mg/dL  Glucose, capillary     Status: Abnormal   Collection Time: 03/30/17 12:53 AM  Result Value Ref Range   Glucose-Capillary 227 (H) 65 - 99 mg/dL  Glucose, capillary     Status: Abnormal   Collection Time: 03/30/17  4:23 AM  Result Value Ref Range   Glucose-Capillary 179 (H) 65 - 99 mg/dL  Glucose, capillary     Status: Abnormal   Collection Time: 03/30/17  8:47 AM  Result Value Ref Range   Glucose-Capillary 106 (H) 65 - 99 mg/dL  Basic metabolic panel     Status: Abnormal   Collection Time: 03/30/17 10:28 AM  Result Value Ref Range   Sodium 132 (L) 135 - 145 mmol/L   Potassium 4.5 3.5 - 5.1 mmol/L  Chloride 103 101 - 111 mmol/L   CO2 22 22 - 32 mmol/L   Glucose, Bld 121 (H) 65 - 99 mg/dL   BUN <5 (L) 6 - 20 mg/dL   Creatinine, Ser 0.84 0.44 - 1.00 mg/dL   Calcium 8.7 (L) 8.9 - 10.3 mg/dL   GFR calc non Af Amer >60 >60 mL/min   GFR calc Af Amer >60 >60 mL/min    Comment: (NOTE) The eGFR has been calculated using the CKD EPI equation. This calculation has not been validated in all clinical situations. eGFR's persistently <60 mL/min signify possible Chronic Kidney Disease.    Anion gap 7 5 - 15  CBC     Status: Abnormal   Collection Time: 03/30/17 10:28 AM  Result Value Ref Range   WBC 6.8 4.0 - 10.5 K/uL   RBC 3.84 (L) 3.87 - 5.11 MIL/uL   Hemoglobin 8.5 (L) 12.0 - 15.0 g/dL   HCT 28.0 (L) 36.0 - 46.0 %   MCV 72.9 (L) 78.0 - 100.0 fL   MCH 22.1 (L) 26.0 - 34.0 pg   MCHC 30.4 30.0 - 36.0 g/dL   RDW 17.3 (H) 11.5 - 15.5 %   Platelets 350 150 - 400 K/uL  Glucose, capillary     Status: Abnormal   Collection Time: 03/30/17  1:28 PM  Result Value Ref Range   Glucose-Capillary 151 (H) 65 - 99 mg/dL  Glucose, capillary      Status: Abnormal   Collection Time: 03/30/17  5:30 PM  Result Value Ref Range   Glucose-Capillary 179 (H) 65 - 99 mg/dL  Glucose, capillary     Status: Abnormal   Collection Time: 03/30/17  8:53 PM  Result Value Ref Range   Glucose-Capillary 218 (H) 65 - 99 mg/dL  Glucose, capillary     Status: Abnormal   Collection Time: 03/31/17 12:44 AM  Result Value Ref Range   Glucose-Capillary 317 (H) 65 - 99 mg/dL  Basic metabolic panel     Status: Abnormal   Collection Time: 03/31/17  3:13 AM  Result Value Ref Range   Sodium 131 (L) 135 - 145 mmol/L   Potassium 4.7 3.5 - 5.1 mmol/L   Chloride 103 101 - 111 mmol/L   CO2 22 22 - 32 mmol/L   Glucose, Bld 262 (H) 65 - 99 mg/dL   BUN 6 6 - 20 mg/dL   Creatinine, Ser 0.94 0.44 - 1.00 mg/dL   Calcium 8.1 (L) 8.9 - 10.3 mg/dL   GFR calc non Af Amer >60 >60 mL/min   GFR calc Af Amer >60 >60 mL/min    Comment: (NOTE) The eGFR has been calculated using the CKD EPI equation. This calculation has not been validated in all clinical situations. eGFR's persistently <60 mL/min signify possible Chronic Kidney Disease.    Anion gap 6 5 - 15  CBC     Status: Abnormal   Collection Time: 03/31/17  3:13 AM  Result Value Ref Range   WBC 8.4 4.0 - 10.5 K/uL   RBC 3.43 (L) 3.87 - 5.11 MIL/uL   Hemoglobin 7.5 (L) 12.0 - 15.0 g/dL   HCT 25.3 (L) 36.0 - 46.0 %   MCV 73.8 (L) 78.0 - 100.0 fL   MCH 21.9 (L) 26.0 - 34.0 pg   MCHC 29.6 (L) 30.0 - 36.0 g/dL   RDW 17.1 (H) 11.5 - 15.5 %   Platelets 306 150 - 400 K/uL  Glucose, capillary     Status: Abnormal  Collection Time: 03/31/17  4:56 AM  Result Value Ref Range   Glucose-Capillary 204 (H) 65 - 99 mg/dL  Glucose, capillary     Status: Abnormal   Collection Time: 03/31/17  8:03 AM  Result Value Ref Range   Glucose-Capillary 175 (H) 65 - 99 mg/dL  Glucose, capillary     Status: Abnormal   Collection Time: 03/31/17 11:56 AM  Result Value Ref Range   Glucose-Capillary 194 (H) 65 - 99 mg/dL    Current  Facility-Administered Medications  Medication Dose Route Frequency Provider Last Rate Last Dose  . 0.9 %  sodium chloride infusion   Intravenous Continuous Palumbo, April, MD 125 mL/hr at 03/31/17 2947    . 0.9 %  sodium chloride infusion   Intravenous Continuous Scatliffe, Kristen D, MD 125 mL/hr at 03/30/17 1252 1,000 mL at 03/30/17 1252  . benztropine mesylate (COGENTIN) injection 1 mg  1 mg Intravenous BID Scatliffe, Cyril Mourning D, MD   1 mg at 03/31/17 1018  . chlorhexidine (PERIDEX) 0.12 % solution 15 mL  15 mL Mouth Rinse BID Rush Farmer, MD   15 mL at 03/31/17 1018  . clonazepam (KLONOPIN) disintegrating tablet 0.5 mg  0.5 mg Per Tube BID Caren Griffins, MD   0.5 mg at 03/31/17 1018  . dextrose 5 % and 0.45 % NaCl with KCl 40 mEq/L infusion   Intravenous Continuous Juanito Doom, MD 75 mL/hr at 03/31/17 6546    . erythromycin (EES) 400 MG/5ML suspension 400 mg  400 mg Per Tube Q8H Caren Griffins, MD   400 mg at 03/31/17 1018  . feeding supplement (GLUCERNA 1.2 CAL) liquid 1,000 mL  1,000 mL Per Tube Continuous Caren Griffins, MD 55 mL/hr at 03/31/17 1023 1,000 mL at 03/31/17 1023  . heparin injection 5,000 Units  5,000 Units Subcutaneous Q8H Scatliffe, Rise Paganini, MD   5,000 Units at 03/31/17 0518  . insulin aspart (novoLOG) injection 0-9 Units  0-9 Units Subcutaneous Q4H Sampson Goon, MD   2 Units at 03/31/17 [phone]  . insulin detemir (LEVEMIR) injection 12 Units  12 Units Subcutaneous QHS Sampson Goon, MD   12 Units at 03/30/17 2232  . labetalol (NORMODYNE,TRANDATE) injection 5 mg  5 mg Intravenous Q2H PRN Caren Griffins, MD      . lidocaine (PF) (XYLOCAINE) 1 % injection   Other PRN Saverio Danker, PA-C   10 mL at 03/25/17 1506  . MEDLINE mouth rinse  15 mL Mouth Rinse q12n4p Rush Farmer, MD   15 mL at 03/30/17 1600  . morphine 4 MG/ML injection 1-2 mg  1-2 mg Intravenous Q4H PRN Caren Griffins, MD   2 mg at 03/30/17 0956  . ondansetron (ZOFRAN) 8 mg in sodium  chloride 0.9 % 50 mL IVPB  8 mg Intravenous Q8H Willia Craze, NP   Stopped at 03/31/17 0533  . pantoprazole (PROTONIX) injection 40 mg  40 mg Intravenous Q24H Scatliffe, Kristen D, MD   40 mg at 03/31/17 1017  . potassium chloride 10 mEq in 100 mL IVPB  10 mEq Intravenous Q1H PRN Scatliffe, Kristen D, MD      . prochlorperazine (COMPAZINE) injection 10 mg  10 mg Intravenous Q6H PRN Caren Griffins, MD      . promethazine (PHENERGAN) suppository 25 mg  25 mg Rectal Q8H Willia Craze, NP   25 mg at 03/31/17 1018  . sodium chloride flush (NS) 0.9 % injection 10-40 mL  10-40  mL Intracatheter Q12H Caren Griffins, MD      . sodium chloride flush (NS) 0.9 % injection 10-40 mL  10-40 mL Intracatheter PRN Caren Griffins, MD   10 mL at 03/31/17 0319    Musculoskeletal: Strength & Muscle Tone: decreased Gait & Station: unable to stand Patient leans: N/A  Psychiatric Specialty Exam: Physical Exam as per history and physical  ROS generalized anxiety No Fever-chills, No Headache, No changes with Vision or hearing, reports vertigo No problems swallowing food or Liquids, No Chest pain, Cough or Shortness of Breath, No Abdominal pain, No Nausea or Vommitting, Bowel movements are regular, No Blood in stool or Urine, No dysuria, No new skin rashes or bruises, No new joints pains-aches,  No new weakness, tingling, numbness in any extremity, No recent weight gain or loss, No polyuria, polydypsia or polyphagia,  A full 10 point Review of Systems was done, except as stated above, all other Review of Systems were negative.  Blood pressure 132/67, pulse 93, temperature 98.6 F (37 C), temperature source Oral, resp. rate 16, height 5' 1"  (1.549 m), weight 64.9 kg (143 lb), last menstrual period 03/24/2017, SpO2 100 %.Body mass index is 27.02 kg/m.  General Appearance: Disheveled and Guarded  Eye Contact:  Fair  Speech:  Clear and Coherent and Slow  Volume:  Decreased  Mood:  Anxious and  Depressed  Affect:  Depressed and Flat  Thought Process:  Coherent and Goal Directed  Orientation:  Full (Time, Place, and Person)  Thought Content:  Rumination  Suicidal Thoughts:  No  Homicidal Thoughts:  No  Memory:  Immediate;   Fair Recent;   Fair Remote;   Fair  Judgement:  Fair  Insight:  Fair  Psychomotor Activity:  Decreased and Restlessness  Concentration:  Concentration: Fair and Attention Span: Fair  Recall:  [employer] of Knowledge:  Good  Language:  Good  Akathisia:  Negative  Handed:  Right  AIMS (if indicated):     Assets:  Communication Skills Desire for Improvement Financial Resources/Insurance Housing Leisure Time Resilience Social Support Transportation  ADL's:  Impaired  Cognition:  WNL  Sleep:        Treatment Plan Summary: 51 years old female with the depression, anxiety, diabetic ketoacidosis, diabetic gastroparesis presented with few days history of nausea or vomiting and unable to function at home. Patient is also develop diabetic neuropathy. Patient has dystonic reaction secondary to BuSpar and Reglan  Recommendation: Continue Clonazepam 0.5 mg  3 Times daily for excessive anxiety Continue benztropine 1 mg 2 times daily for EPS Continue Benadryl 12.5 mg twice daily as needed Continue Ondansetron 8 mg TID and protonix 40 mg qd Patient benefit from IM or IV medications as she was not able to take orally Avoid antipsychotic medication due to recent acute dystonia  Disposition: Patient does not meet criteria for acute psychiatric hospitalization. Referred to the outpatient psychiatric medication management when medically stable. No evidence of imminent risk to self or others at present.   Supportive therapy provided about ongoing stressors.  Ambrose Finland, MD 03/31/2017 12:22 PM

## 2017-04-01 DIAGNOSIS — D508 Other iron deficiency anemias: Secondary | ICD-10-CM

## 2017-04-01 DIAGNOSIS — G2402 Drug induced acute dystonia: Secondary | ICD-10-CM

## 2017-04-01 DIAGNOSIS — E1010 Type 1 diabetes mellitus with ketoacidosis without coma: Principal | ICD-10-CM

## 2017-04-01 LAB — GLUCOSE, CAPILLARY
GLUCOSE-CAPILLARY: 176 mg/dL — AB (ref 65–99)
GLUCOSE-CAPILLARY: 193 mg/dL — AB (ref 65–99)
GLUCOSE-CAPILLARY: 238 mg/dL — AB (ref 65–99)
GLUCOSE-CAPILLARY: 241 mg/dL — AB (ref 65–99)
GLUCOSE-CAPILLARY: 245 mg/dL — AB (ref 65–99)
GLUCOSE-CAPILLARY: 289 mg/dL — AB (ref 65–99)
GLUCOSE-CAPILLARY: 316 mg/dL — AB (ref 65–99)
Glucose-Capillary: 124 mg/dL — ABNORMAL HIGH (ref 65–99)

## 2017-04-01 LAB — CBC
HCT: 23.3 % — ABNORMAL LOW (ref 36.0–46.0)
HEMOGLOBIN: 7 g/dL — AB (ref 12.0–15.0)
MCH: 21.7 pg — ABNORMAL LOW (ref 26.0–34.0)
MCHC: 29.6 g/dL — ABNORMAL LOW (ref 30.0–36.0)
MCV: 73.3 fL — ABNORMAL LOW (ref 78.0–100.0)
Platelets: 291 10*3/uL (ref 150–400)
RBC: 3.18 MIL/uL — AB (ref 3.87–5.11)
RDW: 17.1 % — ABNORMAL HIGH (ref 11.5–15.5)
WBC: 5.7 10*3/uL (ref 4.0–10.5)

## 2017-04-01 LAB — COMPREHENSIVE METABOLIC PANEL
ALK PHOS: 69 U/L (ref 38–126)
ALT: 11 U/L — ABNORMAL LOW (ref 14–54)
ANION GAP: 6 (ref 5–15)
AST: 15 U/L (ref 15–41)
Albumin: 2.6 g/dL — ABNORMAL LOW (ref 3.5–5.0)
BILIRUBIN TOTAL: 0.3 mg/dL (ref 0.3–1.2)
BUN: 6 mg/dL (ref 6–20)
CALCIUM: 8.6 mg/dL — AB (ref 8.9–10.3)
CO2: 24 mmol/L (ref 22–32)
Chloride: 102 mmol/L (ref 101–111)
Creatinine, Ser: 0.83 mg/dL (ref 0.44–1.00)
Glucose, Bld: 190 mg/dL — ABNORMAL HIGH (ref 65–99)
Potassium: 4.6 mmol/L (ref 3.5–5.1)
SODIUM: 132 mmol/L — AB (ref 135–145)
TOTAL PROTEIN: 5.5 g/dL — AB (ref 6.5–8.1)

## 2017-04-01 LAB — ABO/RH: ABO/RH(D): O POS

## 2017-04-01 LAB — PREPARE RBC (CROSSMATCH)

## 2017-04-01 MED ORDER — NYSTATIN 100000 UNIT/GM EX POWD
Freq: Two times a day (BID) | CUTANEOUS | Status: DC
  Administered 2017-04-01 – 2017-04-02 (×2): via TOPICAL
  Administered 2017-04-02: 1 g via TOPICAL
  Filled 2017-04-01: qty 15

## 2017-04-01 MED ORDER — INSULIN DETEMIR 100 UNIT/ML ~~LOC~~ SOLN
8.0000 [IU] | Freq: Two times a day (BID) | SUBCUTANEOUS | Status: DC
  Administered 2017-04-01 – 2017-04-03 (×4): 8 [IU] via SUBCUTANEOUS
  Filled 2017-04-01 (×5): qty 0.08

## 2017-04-01 MED ORDER — FLUCONAZOLE 100 MG PO TABS
100.0000 mg | ORAL_TABLET | Freq: Once | ORAL | Status: AC
  Administered 2017-04-01: 100 mg via ORAL
  Filled 2017-04-01: qty 1

## 2017-04-01 MED ORDER — SODIUM CHLORIDE 0.9 % IV SOLN
Freq: Once | INTRAVENOUS | Status: AC
  Administered 2017-04-01: 10 mL/h via INTRAVENOUS

## 2017-04-01 NOTE — Progress Notes (Signed)
Daily Rounding Note  04/01/2017, 11:42 AM  LOS: 7 days   SUBJECTIVE:     Slight upper abd pain.  No nausea.  Formed BMs yesterday.  Hungry, tolerating clears.  Has not gotten up or out of bed, part of the problem is that foley catheter remains in place  OBJECTIVE:         Vital signs in last 24 hours:    Temp:  [98.2 F (36.8 C)-98.9 F (37.2 C)] 98.9 F (37.2 C) (10/08 0455) Pulse Rate:  [87-91] 87 (10/08 0455) Resp:  [17-18] 17 (10/08 0455) BP: (118-127)/(48-55) 119/55 (10/08 0455) SpO2:  [98 %-100 %] 98 % (10/08 0455) Weight:  [65 kg (143 lb 4.8 oz)] 65 kg (143 lb 4.8 oz) (10/08 0455) Last BM Date: 04/01/17 (small smear) Filed Weights   03/30/17 0500 03/31/17 0501 04/01/17 0455  Weight: 64.8 kg (142 lb 14.4 oz) 64.9 kg (143 lb) 65 kg (143 lb 4.8 oz)   General: looks unwell, sleepy but awake   Heart: RRR Chest: clear bil Abdomen: soft, NT, ND.  Hypoactive BS but none tinkling or tympanitic  Extremities: no CCE Neuro/Psych:  Drowsy but alert, appropriate, oriented x 3.    Intake/Output from previous day: 10/07 0701 - 10/08 0700 In: 6049.5 [I.V.:4707.9; NG/GT:1291.6; IV Piggyback:50] Out: 3500 [Urine:3500]  Intake/Output this shift: Total I/O In: 260 [P.O.:260] Out: 900 [Urine:900]  Lab Results:  Recent Labs  03/30/17 1028 03/31/17 0313 04/01/17 0425  WBC 6.8 8.4 5.7  HGB 8.5* 7.5* 7.0*  HCT 28.0* 25.3* 23.3*  PLT 350 306 291   BMET  Recent Labs  03/30/17 1028 03/31/17 0313 04/01/17 0425  NA 132* 131* 132*  K 4.5 4.7 4.6  CL 103 103 102  CO2 22 22 24   GLUCOSE 121* 262* 190*  BUN <5* 6 6  CREATININE 0.84 0.94 0.83  CALCIUM 8.7* 8.1* 8.6*   LFT  Recent Labs  04/01/17 0425  PROT 5.5*  ALBUMIN 2.6*  AST 15  ALT 11*  ALKPHOS 69  BILITOT 0.3   PT/INR No results for input(s): LABPROT, INR in the last 72 hours. Hepatitis Panel No results for input(s): HEPBSAG, HCVAB, HEPAIGM,  HEPBIGM in the last 72 hours.  Studies/Results: Dg Abd Portable 1v  Result Date: 03/30/2017 CLINICAL DATA:  Encounter for feeding tube placement. EXAM: PORTABLE ABDOMEN - 1 VIEW COMPARISON:  None. FINDINGS: Enteric feeding tube passes well below the diaphragm. Metallic tip projects in the right upper quadrant, likely within the first portion of the duodenum. Normal bowel gas pattern.  Lung bases are clear. IMPRESSION: Enteric feeding tube tip projects in the region of the first portion of the duodenum. Electronically Signed   By: Lajean Manes M.D.   On: 03/30/2017 15:43   Scheduled Meds: . benztropine mesylate  1 mg Intravenous BID  . chlorhexidine  15 mL Mouth Rinse BID  . clonazepam  0.5 mg Per Tube BID  . erythromycin  400 mg Per Tube Q8H  . heparin  5,000 Units Subcutaneous Q8H  . insulin aspart  0-9 Units Subcutaneous Q4H  . insulin detemir  8 Units Subcutaneous BID  . mouth rinse  15 mL Mouth Rinse q12n4p  . pantoprazole (PROTONIX) IV  40 mg Intravenous Q24H  . promethazine  25 mg Rectal Q8H  . sodium chloride flush  10-40 mL Intracatheter Q12H   Continuous Infusions: . dextrose 5 % and 0.45 % NaCl with KCl 40 mEq/L 1,000 mL (  03/31/17 1737)  . feeding supplement (GLUCERNA 1.2 CAL) 1,000 mL (03/31/17 1023)  . ondansetron (ZOFRAN) IV Stopped (04/01/17 0523)  . potassium chloride     PRN Meds:.labetalol, lidocaine (PF), morphine injection, potassium chloride, prochlorperazine, sodium chloride flush   ASSESMENT:   *  Acute on chronic severe diabetic gastroparesis which dates back several years.  Had dystonic reaction to psych meds and possibly to metoclopramide.   Currently managing with scheduled Zofran, rectal Phenergan, enteral Erythromycin, daily Protonix IV, Ativan IV PRN. 0/7/18 placement of small bowel feeding tube.    *  Microcytic anemia.  Chronic.  Ferritin 8, normal iron.  Daily home oral iron on hold.    PLAN   *  Leave feeding tube and tube feedings in place.   Trial full liquids.   Consider dosing (per pharmacy)with parenteral iron, oral iron is problematic in terms of N/V, GI upset, constipation.      Azucena Freed  04/01/2017, 11:42 AM Pager: [phone]   Attending physician's note   I have taken an interval history, reviewed the chart and examined the patient. I agree with the Advanced Practitioner's note, impression and recommendations.  39 yr F with poorly controlled diabetes, severe gastroparesis admitted with DKA and dystonia Monitor daily QTc on Erythromycin Continue tube feeds to improve nutritional status Slowly advance diet as tolerated Patient will need to follow up with Dr Corliss Parish at [facility] and get evaluated for possible gastric stimulator/pacer for management of severe gastroparesis.   Damaris Hippo, MD [phone] Mon-Fri 8a-5p [phone] after 5p, weekends, holidays

## 2017-04-01 NOTE — Progress Notes (Signed)
Inpatient Diabetes Program Recommendations  AACE/ADA: New Consensus Statement on Inpatient Glycemic Control (2015)  Target Ranges:  Prepandial:   less than 140 mg/dL      Peak postprandial:   less than 180 mg/dL (1-2 hours)      Critically ill patients:  140 - 180 mg/dL   Lab Results  Component Value Date   GLUCAP 124 (H) 04/01/2017   HGBA1C 10.1 (H) 03/17/2017    Review of Glycemic ControlResults for CIARRAH, RAE (MRN [id number]) as of 04/01/2017 11:13  Ref. Range 03/31/2017 04:56 03/31/2017 08:03 03/31/2017 11:56 03/31/2017 17:26 03/31/2017 20:35 03/31/2017 23:59 04/01/2017 04:53 04/01/2017 08:18  Glucose-Capillary Latest Ref Range: 65 - 99 mg/dL 204 (H) 175 (H) 194 (H) 346 (H) 245 (H) 316 (H) 193 (H) 124 (H)    Diabetes history: Type 1 diabetes/gastroparesis Outpatient Diabetes medications: Levemir 16 units bid, Novolog 0-12 units tid with meals Current orders for Inpatient glycemic control:  Novolog sensitive q 4 hours, Levemir 12 units q HS, Glucerna 55 cc/ hr   Inpatient Diabetes Program Recommendations:    Note that CBG's inconsistent with highs and lows.  Consider splitting Levemir to 8 units bid to ensure that the basal insulin is lasting a full 24 hours.  Will text page MD.   Thanks, Adah Perl, RN, BC-ADM Inpatient Diabetes Coordinator Pager [phone] (8a-5p)

## 2017-04-01 NOTE — Progress Notes (Signed)
Physical Therapy Treatment Patient Details Name: [name] MRN: [id number] DOB: 1966-01-26 Today's Date: 04/01/2017    History of Present Illness 51 yr old female with PMHx Anxiety, Depression, Peripheral Neuropathy,  IDDM and Gastroparesis frequent visits to ER for nausea and vomitting presents from [facility] recently treated for DKA.  Some adjustments were made in her psychotropic meds. Now presenting to their ED with rigidity, spasms, cogwheeling.  No fever but elevated lactate.  Physician was concerned for serotonin syndrome. Neurology consulted and thought possible adverse drug reaction with Reglan - extrapyramidal symptoms  and possible behavioral disturbance in the setting of acute illness    PT Comments    Pt ambulated 100' with RW and min-guard A. Pt very fatigued with all activity, required mod A to get to EOB and min for sit to stand. Pt very aggravated by NG tube and reports that it is so distracting that she cannot think of anything else. Pt described difficulty with her vision while walking that was worse than her baseline and she ran into several obstacles and doorways because she could not see them. PT will continue to follow.    Follow Up Recommendations  SNF     Equipment Recommendations  Rolling walker with 5" wheels    Recommendations for Other Services       Precautions / Restrictions Precautions Precautions: Fall Restrictions Weight Bearing Restrictions: No    Mobility  Bed Mobility Overal bed mobility: Needs Assistance Bed Mobility: Supine to Sit     Supine to sit: Mod assist     General bed mobility comments: HOB elevated. Pt attempted to initiate movement but did not make any progress, with vc's for reach across body for rail and begin sliding her feet, she was able to begin moving. Mod A to get hips to EOB  Transfers Overall transfer level: Needs assistance Equipment used: Rolling walker (2 wheeled) Transfers: Sit to/from Stand Sit to Stand: Min  assist         General transfer comment: min A for power up from bed and recliner and vc's for hand placement with each transfer  Ambulation/Gait Ambulation/Gait assistance: Min guard Ambulation Distance (Feet): 100 Feet Assistive device: Rolling walker (2 wheeled) Gait Pattern/deviations: Step-through pattern;Shuffle;Trunk flexed Gait velocity: decreased Gait velocity interpretation: <1.8 ft/sec, indicative of risk for recurrent falls General Gait Details: pt noticed when she was ambulating that she was having difficulty with her vision, more than her baseline. Ran into obstcales several times with the RW and had to be redirected   Stairs            Wheelchair Mobility    Modified Rankin (Stroke Patients Only)       Balance Overall balance assessment: Needs assistance Sitting-balance support: Feet supported;No upper extremity supported Sitting balance-Leahy Scale: Fair     Standing balance support: Bilateral upper extremity supported Standing balance-Leahy Scale: Poor                              Cognition Arousal/Alertness: Awake/alert Behavior During Therapy: Flat affect Overall Cognitive Status: Impaired/Different from baseline Area of Impairment: Following commands;Problem solving                       Following Commands: Follows one step commands with increased time;Follows multi-step commands with increased time;Follows one step commands consistently;Follows multi-step commands consistently     Problem Solving: Slow processing;Decreased initiation;Requires verbal cues;Requires tactile cues General  Comments: pt with sluggish responses. Very anxious about the tube in her nose and says that she can't think about anything else with it bothering her so much      Exercises      General Comments General comments (skin integrity, edema, etc.): pt able to eat her bowl of jello, having trouble getting spoon to her mouth at first       Pertinent Vitals/Pain Pain Assessment: Faces Faces Pain Scale: Hurts a little bit Pain Location: stomach Pain Descriptors / Indicators: Guarding Pain Intervention(s): Limited activity within patient's tolerance;Monitored during session;Premedicated before session    Home Living                      Prior Function            PT Goals (current goals can now be found in the care plan section) Acute Rehab PT Goals Patient Stated Goal: go home PT Goal Formulation: With patient Time For Goal Achievement: 04/09/17 Potential to Achieve Goals: Fair Progress towards PT goals: Progressing toward goals    Frequency    Min 3X/week      PT Plan Current plan remains appropriate    Co-evaluation              AM-PAC PT "6 Clicks" Daily Activity  Outcome Measure  Difficulty turning over in bed (including adjusting bedclothes, sheets and blankets)?: Unable Difficulty moving from lying on back to sitting on the side of the bed? : Unable Difficulty sitting down on and standing up from a chair with arms (e.g., wheelchair, bedside commode, etc,.)?: Unable Help needed moving to and from a bed to chair (including a wheelchair)?: A Little Help needed walking in hospital room?: A Little Help needed climbing 3-5 steps with a railing? : A Lot 6 Click Score: 11    End of Session Equipment Utilized During Treatment: Gait belt Activity Tolerance: Patient limited by fatigue;Patient tolerated treatment well Patient left: in chair;with call bell/phone within reach Nurse Communication: Mobility status PT Visit Diagnosis: Unsteadiness on feet (R26.81);Other abnormalities of gait and mobility (R26.89);Muscle weakness (generalized) (M62.81);Difficulty in walking, not elsewhere classified (R26.2);Other symptoms and signs involving the nervous system (R29.898);Pain     Time: 1318-1400 PT Time Calculation (min) (ACUTE ONLY): 42 min  Charges:  $Gait Training: 23-37 mins $Therapeutic  Activity: 8-22 mins                    G Codes:       Leighton Roach, PT  Acute Rehab Services  [place] 04/01/2017, 2:12 PM

## 2017-04-01 NOTE — Progress Notes (Signed)
Initial visit with patient.  She is very discouraged.  She expressed she is tired of being sick.  She really hates being sick all the time.  Her family used to come see her in the hospital but she feels she is here so often they dont even come anymore to see her.  She expressed she cannot do her job anymore. Cant work anymore. She is concerned about that.  I shared scripture and had prayer with her regarding the concerns she mentioned.  Conard Novak, Chaplain   04/01/17 1200  Clinical Encounter Type  Visited With Patient (patient)  Visit Type Initial  Referral From Nurse  Consult/Referral To Chaplain  Spiritual Encounters  Spiritual Needs Emotional;Other (Comment)  Stress Factors  Patient Stress Factors Exhausted  Family Stress Factors Loss

## 2017-04-01 NOTE — Progress Notes (Addendum)
PROGRESS NOTE  [name] VQM:[id number] DOB: September 11, 1965 DOA: 03/24/2017 PCP: [name], MD   LOS: 7 days   Brief Narrative / Interim history: 51 YEAR old lady with prior h/o IDDM, multiple admissions for DKA, gastroparesis comes in for nausea, and vomiting for a few days. She was admitted to [facility] service for possible serotonin syndrome.   Assessment & Plan: Active Problems:   Microcytic anemia   Anxiety and depression   Anemia, iron deficiency   DKA, type 1, not at goal [facility])   Acute dystonia due to drugs   Acute dystonic reaction -Patient ongoing nausea as well as generalized abdominal pain make neuro exam somewhat difficult, however she appears to be still rigid on exam, consulted neurology, appreciate input -Improving, she was on Benadryl but was discontinued due to concern for excessive sedation  Intractable nausea vomiting due to severe gastroparesis -last time she was hospitalized she was discharged on Reglan which may explain #1 -Scheduled Zofran for now -Due to persistent symptoms I have consulted gastroenterology, appreciate input.  She has been seen by gastroenterology at [facility] as well -cortrack placed 10/6 for severe intractable gastroparesis -She was started on erythromycin, changed to clonazepam instead of Ativan per psychiatry recommendations, seems to be improving, tolerated clear liquids on 10/7, advance to full liquids on 10/8  Microcytic anemia -Due to chronic disease/iron deficiency, patient with history of heavy menses, no evidence of bleeding currently, transfuse today for a hemoglobin of 7.0  Anxiety / depression -Consulted psychiatry for further recommendations, discussed with Dr. Lenna Sciara 10/5, recommends clonazepam, Cogentin and Benadryl as needed, recommending against antipsychotic medications at this time   SIRS -There was concern for infection on admission, but no source was confirmed, she was initially placed on vancomycin and cefepime which  were discontinued on 10/4.  Cultures without growth.  Insulin-dependent diabetes mellitus with recurrent DKA's -? Non compliance, DKA has now resolved -Poorly controlled with hemoglobin A1c around 10  HTN -hold Lisinopril until able to have consistent po intake   DVT prophylaxis: heparin Code Status: Full code Family Communication: no family at bedside Disposition Plan: home when ready   Consultants:   Neurology   GI  Psychiatry  Procedures:   None   Antimicrobials:  Vancomycin 10/1 >> 10/3  Cefepime  10/1 >> 10/4  Subjective: -Wants the feeding tube out, states nausea has gotten better, wants to eat more.  No chest pain or shortness of breath.  Objective: Vitals:   03/31/17 0501 03/31/17 1456 03/31/17 2034 04/01/17 0455  BP: 132/67 (!) 127/52 (!) 118/48 (!) 119/55  Pulse: 93 90 91 87  Resp: 16 18 17 17   Temp: 98.6 F (37 C) 98.7 F (37.1 C) 98.2 F (36.8 C) 98.9 F (37.2 C)  TempSrc: Oral Oral    SpO2: 100% 100% 100% 98%  Weight: 64.9 kg (143 lb)   65 kg (143 lb 4.8 oz)  Height:        Intake/Output Summary (Last 24 hours) at 04/01/17 1120 Last data filed at 04/01/17 1037  Gross per 24 hour  Intake          6124.34 ml  Output             4400 ml  Net          1724.34 ml   Filed Weights   03/30/17 0500 03/31/17 0501 04/01/17 0455  Weight: 64.8 kg (142 lb 14.4 oz) 64.9 kg (143 lb) 65 kg (143 lb 4.8 oz)  Examination:  Constitutional: NAD, calm, comfortable Eyes: lids and conjunctivae normal Respiratory: clear to auscultation bilaterally, no wheezing, no crackles. Normal respiratory effort.  Cardiovascular: Regular rate and rhythm, no murmurs / rubs / gallops. No LE edema. 2+ pedal pulses.  Abdomen: no tenderness. Bowel sounds positive.  Neurologic: non focal  Data Reviewed: I have independently reviewed following labs and imaging studies   CBC:  Recent Labs Lab 03/26/17 0408 03/29/17 0617 03/30/17 1028 03/31/17 0313 04/01/17 0425    WBC 8.2 7.5 6.8 8.4 5.7  HGB 8.1* 8.9* 8.5* 7.5* 7.0*  HCT 27.2* 30.3* 28.0* 25.3* 23.3*  MCV 73.5* 73.0* 72.9* 73.8* 73.3*  PLT 395 368 350 306 161   Basic Metabolic Panel:  Recent Labs Lab 03/26/17 0408  03/28/17 0600 03/29/17 0617 03/30/17 1028 03/31/17 0313 04/01/17 0425  NA 137  < > 131* 129* 132* 131* 132*  K 3.5  < > 4.3 4.6 4.5 4.7 4.6  CL 106  < > 100* 102 103 103 102  CO2 25  < > 24 22 22 22 24   GLUCOSE 109*  < > 176* 145* 121* 262* 190*  BUN 7  < > <5* <5* <5* 6 6  CREATININE 0.79  < > 0.92 0.86 0.84 0.94 0.83  CALCIUM 8.4*  < > 8.7* 8.5* 8.7* 8.1* 8.6*  MG 1.7  --   --   --   --   --   --   PHOS 2.6  --   --   --   --   --   --   < > = values in this interval not displayed. GFR: Estimated Creatinine Clearance: 70.8 mL/min (by C-G formula based on SCr of 0.83 mg/dL). Liver Function Tests:  Recent Labs Lab 04/01/17 0425  AST 15  ALT 11*  ALKPHOS 69  BILITOT 0.3  PROT 5.5*  ALBUMIN 2.6*   No results for input(s): LIPASE, AMYLASE in the last 168 hours. No results for input(s): AMMONIA in the last 168 hours. Coagulation Profile: No results for input(s): INR, PROTIME in the last 168 hours. Cardiac Enzymes: No results for input(s): CKTOTAL, CKMB, CKMBINDEX, TROPONINI in the last 168 hours. BNP (last 3 results) No results for input(s): PROBNP in the last 8760 hours. HbA1C: No results for input(s): HGBA1C in the last 72 hours. CBG:  Recent Labs Lab 03/31/17 1726 03/31/17 2035 03/31/17 2359 04/01/17 0453 04/01/17 0818  GLUCAP 346* 245* 316* 193* 124*   Lipid Profile: No results for input(s): CHOL, HDL, LDLCALC, TRIG, CHOLHDL, LDLDIRECT in the last 72 hours. Thyroid Function Tests: No results for input(s): TSH, T4TOTAL, FREET4, T3FREE, THYROIDAB in the last 72 hours. Anemia Panel: No results for input(s): VITAMINB12, FOLATE, FERRITIN, TIBC, IRON, RETICCTPCT in the last 72 hours. Urine analysis:    Component Value Date/Time   COLORURINE YELLOW  03/24/2017 [place] 03/24/2017 2359   LABSPEC 1.020 03/24/2017 2359   PHURINE 7.0 03/24/2017 2359   GLUCOSEU >=500 (A) 03/24/2017 2359   HGBUR TRACE (A) 03/24/2017 2359   BILIRUBINUR NEGATIVE 03/24/2017 2359   BILIRUBINUR neg 01/17/2017 1533   KETONESUR 15 (A) 03/24/2017 2359   PROTEINUR 100 (A) 03/24/2017 2359   UROBILINOGEN 0.2 01/17/2017 1533   UROBILINOGEN 0.2 04/27/2015 1608   NITRITE NEGATIVE 03/24/2017 2359   LEUKOCYTESUR NEGATIVE 03/24/2017 2359   Sepsis Labs: Invalid input(s): PROCALCITONIN, LACTICIDVEN  Recent Results (from the past 240 hour(s))  MRSA PCR Screening     Status: None   Collection Time:  03/25/17  4:21 AM  Result Value Ref Range Status   MRSA by PCR NEGATIVE NEGATIVE Final    Comment:        The GeneXpert MRSA Assay (FDA approved for NASAL specimens only), is one component of a comprehensive MRSA colonization surveillance program. It is not intended to diagnose MRSA infection nor to guide or monitor treatment for MRSA infections.   Culture, blood (routine x 2)     Status: None   Collection Time: 03/25/17 11:17 AM  Result Value Ref Range Status   Specimen Description BLOOD LEFT WRIST  Final   Special Requests IN PEDIATRIC BOTTLE Blood Culture adequate volume  Final   Culture NO GROWTH 5 DAYS  Final   Report Status 03/30/2017 FINAL  Final  Culture, blood (routine x 2)     Status: None   Collection Time: 03/25/17 11:49 AM  Result Value Ref Range Status   Specimen Description BLOOD LEFT HAND  Final   Special Requests IN PEDIATRIC BOTTLE Blood Culture adequate volume  Final   Culture NO GROWTH 5 DAYS  Final   Report Status 03/30/2017 FINAL  Final      Radiology Studies: Dg Abd Portable 1v  Result Date: 03/30/2017 CLINICAL DATA:  Encounter for feeding tube placement. EXAM: PORTABLE ABDOMEN - 1 VIEW COMPARISON:  None. FINDINGS: Enteric feeding tube passes well below the diaphragm. Metallic tip projects in the right upper quadrant,  likely within the first portion of the duodenum. Normal bowel gas pattern.  Lung bases are clear. IMPRESSION: Enteric feeding tube tip projects in the region of the first portion of the duodenum. Electronically Signed   By: Lajean Manes M.D.   On: 03/30/2017 15:43     Scheduled Meds: . benztropine mesylate  1 mg Intravenous BID  . chlorhexidine  15 mL Mouth Rinse BID  . clonazepam  0.5 mg Per Tube BID  . erythromycin  400 mg Per Tube Q8H  . heparin  5,000 Units Subcutaneous Q8H  . insulin aspart  0-9 Units Subcutaneous Q4H  . insulin detemir  8 Units Subcutaneous BID  . mouth rinse  15 mL Mouth Rinse q12n4p  . pantoprazole (PROTONIX) IV  40 mg Intravenous Q24H  . promethazine  25 mg Rectal Q8H  . sodium chloride flush  10-40 mL Intracatheter Q12H   Continuous Infusions: . dextrose 5 % and 0.45 % NaCl with KCl 40 mEq/L 1,000 mL (03/31/17 1737)  . feeding supplement (GLUCERNA 1.2 CAL) 1,000 mL (03/31/17 1023)  . ondansetron (ZOFRAN) IV Stopped (04/01/17 0523)  . potassium chloride      Marzetta Board, MD, PhD [facility] Pager [phone]  If 7PM-7AM, please contact night-coverage [email or web address] Password [email or web address] 04/01/2017, 11:20 AM

## 2017-04-02 ENCOUNTER — Telehealth: Payer: Self-pay

## 2017-04-02 LAB — TYPE AND SCREEN
ABO/RH(D): O POS
Antibody Screen: NEGATIVE
UNIT DIVISION: 0

## 2017-04-02 LAB — GLUCOSE, CAPILLARY
Glucose-Capillary: 190 mg/dL — ABNORMAL HIGH (ref 65–99)
Glucose-Capillary: 126 mg/dL — ABNORMAL HIGH (ref 65–99)
Glucose-Capillary: 132 mg/dL — ABNORMAL HIGH (ref 65–99)
Glucose-Capillary: 203 mg/dL — ABNORMAL HIGH (ref 65–99)
Glucose-Capillary: 158 mg/dL — ABNORMAL HIGH (ref 65–99)

## 2017-04-02 LAB — CBC
HCT: 27.2 % — ABNORMAL LOW (ref 36.0–46.0)
Hemoglobin: 8.1 g/dL — ABNORMAL LOW (ref 12.0–15.0)
MCH: 22.1 pg — AB (ref 26.0–34.0)
MCHC: 29.8 g/dL — ABNORMAL LOW (ref 30.0–36.0)
MCV: 74.3 fL — AB (ref 78.0–100.0)
PLATELETS: 285 10*3/uL (ref 150–400)
RBC: 3.66 MIL/uL — ABNORMAL LOW (ref 3.87–5.11)
RDW: 17.1 % — AB (ref 11.5–15.5)
WBC: 6 10*3/uL (ref 4.0–10.5)

## 2017-04-02 LAB — BPAM RBC
BLOOD PRODUCT EXPIRATION DATE: 201811052359
ISSUE DATE / TIME: 201810081832
Unit Type and Rh: 5100

## 2017-04-02 LAB — BASIC METABOLIC PANEL
ANION GAP: 7 (ref 5–15)
BUN: 8 mg/dL (ref 6–20)
CO2: 28 mmol/L (ref 22–32)
Calcium: 9 mg/dL (ref 8.9–10.3)
Chloride: 100 mmol/L — ABNORMAL LOW (ref 101–111)
Creatinine, Ser: 1.01 mg/dL — ABNORMAL HIGH (ref 0.44–1.00)
Glucose, Bld: 171 mg/dL — ABNORMAL HIGH (ref 65–99)
POTASSIUM: 4.2 mmol/L (ref 3.5–5.1)
Sodium: 135 mmol/L (ref 135–145)

## 2017-04-02 MED ORDER — PANTOPRAZOLE SODIUM 40 MG PO TBEC
40.0000 mg | DELAYED_RELEASE_TABLET | Freq: Every day | ORAL | Status: DC
  Administered 2017-04-02 – 2017-04-03 (×2): 40 mg via ORAL
  Filled 2017-04-02 (×2): qty 1

## 2017-04-02 MED ORDER — ERYTHROMYCIN ETHYLSUCCINATE 400 MG/5ML PO SUSR
400.0000 mg | Freq: Three times a day (TID) | ORAL | Status: DC
  Administered 2017-04-02 – 2017-04-03 (×3): 400 mg via ORAL
  Filled 2017-04-02 (×5): qty 5

## 2017-04-02 MED ORDER — FINASTERIDE 5 MG PO TABS
5.0000 mg | ORAL_TABLET | Freq: Every day | ORAL | Status: DC
  Administered 2017-04-02: 5 mg via ORAL
  Filled 2017-04-02 (×2): qty 1

## 2017-04-02 NOTE — Progress Notes (Signed)
Physical Therapy Treatment Patient Details Name: [name] MRN: [id number] DOB: 1966-01-25 Today's Date: 04/02/2017    History of Present Illness 51 yr old female with PMHx Anxiety, Depression, Peripheral Neuropathy,  IDDM and Gastroparesis frequent visits to ER for nausea and vomitting presents from [facility] recently treated for DKA.  Some adjustments were made in her psychotropic meds. Now presenting to their ED with rigidity, spasms, cogwheeling.  No fever but elevated lactate.  Physician was concerned for serotonin syndrome. Neurology consulted and thought possible adverse drug reaction with Reglan - extrapyramidal symptoms  and possible behavioral disturbance in the setting of acute illness    PT Comments    Pt making good progress today, ambulated 150' with RW and min-guard A as well as tolerating standing balance exercises without UE support. However, she continues to do everything at very slow pace and fatigues very quickly, would not be safe to be at home alone at this point. PT will continue to follow.    Follow Up Recommendations  SNF     Equipment Recommendations  Rolling walker with 5" wheels    Recommendations for Other Services       Precautions / Restrictions Precautions Precautions: Fall Restrictions Weight Bearing Restrictions: No    Mobility  Bed Mobility               General bed mobility comments: pt received sitting in chair  Transfers Overall transfer level: Needs assistance Equipment used: Rolling walker (2 wheeled) Transfers: Sit to/from Stand Sit to Stand: Supervision         General transfer comment: pt stood from recliner several times with increased time needed but no physical assistance. vc's needed for correct positioning in front of chair before sitting  Ambulation/Gait Ambulation/Gait assistance: Min guard Ambulation Distance (Feet): 150 Feet Assistive device: Rolling walker (2 wheeled) Gait Pattern/deviations: Step-through  pattern;Trunk flexed Gait velocity: decreased Gait velocity interpretation: Below normal speed for age/gender General Gait Details: pt with slightly faster pace today and better navigating around obstacles, she felt that her vision was better than yesterday and unhindered by NG tube. Fatigue noted after 125'   Stairs            Wheelchair Mobility    Modified Rankin (Stroke Patients Only)       Balance Overall balance assessment: Needs assistance Sitting-balance support: Feet supported;No upper extremity supported Sitting balance-Leahy Scale: Fair     Standing balance support: Bilateral upper extremity supported Standing balance-Leahy Scale: Fair Standing balance comment: worked on standing balance without support, with reaching activities in all planes as well as trunk rotation. This was very tiring for pt but she was able to move more fluidly after balance practice                            Cognition Arousal/Alertness: Awake/alert Behavior During Therapy: Flat affect Overall Cognitive Status: Impaired/Different from baseline Area of Impairment: Following commands;Problem solving                       Following Commands: Follows one step commands with increased time;Follows multi-step commands with increased time;Follows one step commands consistently;Follows multi-step commands consistently     Problem Solving: Slow processing;Decreased initiation;Requires verbal cues;Requires tactile cues General Comments: continues to have delayed and slow responses, but she is aware of this today and comments that she worried she will never be back to herself      Exercises  General Exercises - Lower Extremity Ankle Circles/Pumps: AROM;Both;10 reps;Seated Long Arc Quad: AROM;Both;10 reps;Seated Hip Flexion/Marching: AROM;Both;10 reps;Seated    General Comments General comments (skin integrity, edema, etc.): discussed SNF and pt really does not want to go  but she is in agreement that she could probably not care for herself at home alone at this point      Pertinent Vitals/Pain Pain Assessment: No/denies pain    Home Living                      Prior Function            PT Goals (current goals can now be found in the care plan section) Acute Rehab PT Goals Patient Stated Goal: go home PT Goal Formulation: With patient Time For Goal Achievement: 04/09/17 Potential to Achieve Goals: Fair Progress towards PT goals: Progressing toward goals    Frequency    Min 3X/week      PT Plan Current plan remains appropriate    Co-evaluation              AM-PAC PT "6 Clicks" Daily Activity  Outcome Measure  Difficulty turning over in bed (including adjusting bedclothes, sheets and blankets)?: A Lot Difficulty moving from lying on back to sitting on the side of the bed? : A Lot Difficulty sitting down on and standing up from a chair with arms (e.g., wheelchair, bedside commode, etc,.)?: A Little Help needed moving to and from a bed to chair (including a wheelchair)?: A Little Help needed walking in hospital room?: A Little Help needed climbing 3-5 steps with a railing? : A Lot 6 Click Score: 15    End of Session Equipment Utilized During Treatment: Gait belt Activity Tolerance: Patient tolerated treatment well Patient left: in chair;with call bell/phone within reach Nurse Communication: Mobility status PT Visit Diagnosis: Unsteadiness on feet (R26.81);Other abnormalities of gait and mobility (R26.89);Muscle weakness (generalized) (M62.81);Difficulty in walking, not elsewhere classified (R26.2);Other symptoms and signs involving the nervous system (R29.898);Pain     Time: 1109-1140 PT Time Calculation (min) (ACUTE ONLY): 31 min  Charges:  $Gait Training: 8-22 mins $Therapeutic Exercise: 8-22 mins                    G Codes:       Leighton Roach, PT  Acute Rehab Services  [place] 04/02/2017, 1:31 PM

## 2017-04-02 NOTE — Telephone Encounter (Signed)
Patient's appointment at [facility] for 04/05/17 will be rescheduled when her discharge date is determined.

## 2017-04-02 NOTE — Progress Notes (Signed)
Daily Rounding Note  04/02/2017, 9:32 AM  LOS: 8 days   SUBJECTIVE:   Chief complaint:   Nausea, vomiting.  None for >48 hours.  Tolerating full liquids.  Wants nasal feeding tube removed.  2 soft, formed, brown stools yesterday.    OBJECTIVE:         Vital signs in last 24 hours:    Temp:  [98.4 F (36.9 C)-99.5 F (37.5 C)] 99.1 F (37.3 C) (10/09 0823) Pulse Rate:  [81-106] 83 (10/09 0823) Resp:  [16-17] 16 (10/09 0823) BP: (111-147)/(50-62) 123/51 (10/09 0823) SpO2:  [98 %-100 %] 99 % (10/09 0823) Weight:  [64 kg (141 lb)] 64 kg (141 lb) (10/09 0500) Last BM Date: 04/01/17 (small smear) Filed Weights   03/31/17 0501 04/01/17 0455 04/02/17 0500  Weight: 64.9 kg (143 lb) 65 kg (143 lb 4.8 oz) 64 kg (141 lb)   General: looks better, but not well.  Depressed   Heart: RRR Chest: clear bil.  No dyspnea or cough Abdomen: soft, active BS.  NT, ND.  Extremities: no CCE Neuro/Psych:  Depressed, cooperative. No tremor.    Intake/Output from previous day: 10/08 0701 - 10/09 0700 In: 3112.8 [P.O.:1010; I.V.:1008.8; Blood:330; NG/GT:660; IV Piggyback:104] Out: 5249 [Urine:5249]  Intake/Output this shift: No intake/output data recorded.  Lab Results:  Recent Labs  03/31/17 0313 04/01/17 0425 04/02/17 0436  WBC 8.4 5.7 6.0  HGB 7.5* 7.0* 8.1*  HCT 25.3* 23.3* 27.2*  PLT 306 291 285   BMET  Recent Labs  03/31/17 0313 04/01/17 0425 04/02/17 0436  NA 131* 132* 135  K 4.7 4.6 4.2  CL 103 102 100*  CO2 22 24 28   GLUCOSE 262* 190* 171*  BUN 6 6 8   CREATININE 0.94 0.83 1.01*  CALCIUM 8.1* 8.6* 9.0   LFT  Recent Labs  04/01/17 0425  PROT 5.5*  ALBUMIN 2.6*  AST 15  ALT 11*  ALKPHOS 69  BILITOT 0.3   PT/INR No results for input(s): LABPROT, INR in the last 72 hours. Hepatitis Panel No results for input(s): HEPBSAG, HCVAB, HEPAIGM, HEPBIGM in the last 72 hours.  Studies/Results: No results  found.  ASSESMENT:   *  Diabetic gastroparesis, acute on chronic.  Scheduled Zofran, rectal Phenergan, enteral Erythromycin, daily Protonix IV, Ativan IV PRN. 0/7/18 placement of small bowel feeding tube.     *  Microcytic anemia, chronic.  Normal iron, low ferritin.  On po iron at home.  2010 EGD, normal.  No previous colonoscopy  *  Dystonic reaction to psych meds and reglan.   PLAN   *  Remove post pyloric feeding tube.  Advance to soft, carb mod diet.   Switch to po Protonix.    *  Consider dosing parenteral iron.  Would not resume po iron due to known GI s/e profile of this med.   *  Needs to reestablish with teritary care GI, Dr Derrill Kay at [facility], [phone], who proposed gastric pacemaker several years ago.   Continue Erythromycin, po Protonix 1 x daily, prn rectal phenergen, prn oral zofran at discharge.   Needs close, tight control of blood sugars.   Will sign off.      Azucena Freed  04/02/2017, 9:32 AM Pager: [phone]   Attending physician's note   I have taken an interval history, reviewed the chart and examined the patient. I agree with the Advanced Practitioner's note, impression and recommendations.  Feels better, tolerating soft diet Small frequent  meals, avoid high fiber and high fat diet Will need to re establish with Dr Filbert Berthold, MD [phone] Mon-Fri 8a-5p [phone] after 5p, weekends, holidays

## 2017-04-02 NOTE — Progress Notes (Signed)
PROGRESS NOTE  [name] GUR:[id number] DOB: May 15, 1966 DOA: 03/24/2017 PCP: [name], MD   LOS: 8 days   Brief Narrative / Interim history: 51 YEAR old lady with prior h/o IDDM, multiple admissions for DKA, gastroparesis comes in for rigidity, nausea, and vomiting for a few days. She was admitted to [facility] service for DKA as well as acute dystonic reaction related to Reglan.  Her DKA improved, however her gastroparesis is very slow to improve, and due to no p.o. intake eventually underwent an NG tube for feeding on 10/7.  Following that, she was started on erythromycin as well as clonazepam as suggested by psychiatry, and she improved and her diet is slowly advanced.  Gastroenterology consulted postop  Assessment & Plan: Active Problems:   Microcytic anemia   Anxiety and depression   Anemia, iron deficiency   DKA, type 1, not at goal [facility])   Acute dystonia due to drugs   Acute dystonic reaction -This is improving, neurology and psychiatry evaluated patient while hospitalized, she was placed on Cogentin, continue -In the acute phase she was briefly on Benadryl but this was discontinued due to concern for excessive sedation -For now hold her psychiatric medications per psychiatry, recommended clonazepam for her anxiety  Intractable nausea vomiting due to severe gastroparesis -last time she was hospitalized she was discharged on Reglan which may explain #1 -Scheduled Zofran for now -Due to persistent symptoms I have consulted gastroenterology, appreciate input.  She has been seen by gastroenterology at [facility] as well and was advanced point evaluated for stimulator, but this was denied by her insurance -cortrack placed 10/6 for severe intractable gastroparesis, advance diet, likely to be removed in 1-2 days as she seems to be tolerating diet -She was started on erythromycin, changed to clonazepam instead of Ativan per psychiatry recommendations, seems to be improving, tolerated  clear liquids on 10/7, advanced to full liquids on 10/8 and appears to be doing well.  Appreciate GI input  Microcytic anemia -Due to chronic disease/iron deficiency, patient with history of heavy menses, no evidence of bleeding currently, transfused 10/8 for a hemoglobin of 7.0, improved to 8 today  Anxiety / depression -Consulted psychiatry for further recommendations, discussed with Dr. Lenna Sciara 10/5, recommends clonazepam, Cogentin and Benadryl as needed, recommending against antipsychotic medications at this time  Urinary retention  -Likely in the setting of antipsychotic medications/bedrest, she had a Foley placed while she was in the ICU, she failed a voiding trial in stepdown and required Foley to be placed again around 10/5, another voiding trial underway, Foley removed on 10/8.  Closely monitor  SIRS -There was concern for infection on admission, but no source was confirmed, she was initially placed on vancomycin and cefepime which were discontinued on 10/4.  Cultures without growth.  Insulin-dependent diabetes mellitus with recurrent DKA's -? Non compliance, DKA has now resolved -Poorly controlled with hemoglobin A1c around 10  HTN -hold Lisinopril still, blood pressure stable   DVT prophylaxis: heparin Code Status: Full code Family Communication: no family at bedside Disposition Plan: home when ready   Consultants:   Neurology   GI  Psychiatry  Procedures:   None   Antimicrobials:  Vancomycin 10/1 >> 10/3  Cefepime  10/1 >> 10/4  Subjective: -She is feeling better, no longer has vomiting, able to tolerate a full liquid diet.  She desperately wants the NG tube out  Objective: Vitals:   04/01/17 2157 04/02/17 0448 04/02/17 0500 04/02/17 0823  BP: 132/62 (!) 111/50  (!) 123/51  Pulse: 94 81  83  Resp: 16 17  16   Temp: 99.5 F (37.5 C) 99.2 F (37.3 C)  99.1 F (37.3 C)  TempSrc: Oral   Oral  SpO2: 100% 98%  99%  Weight:   64 kg (141 lb)   Height:         Intake/Output Summary (Last 24 hours) at 04/02/17 1023 Last data filed at 04/02/17 0601  Gross per 24 hour  Intake          3112.75 ml  Output             4349 ml  Net         -1236.25 ml   Filed Weights   03/31/17 0501 04/01/17 0455 04/02/17 0500  Weight: 64.9 kg (143 lb) 65 kg (143 lb 4.8 oz) 64 kg (141 lb)    Examination:  Constitutional: No apparent distress, calm, appearing comfortable Eyes: No scleral icterus Respiratory: Clear to auscultation bilaterally, no wheezing, no crackles.  Normal respiratory effort Cardiovascular: Regular rate and rhythm, no murmurs.  No edema. Abdomen: No tenderness, bowel sounds positive Neurologic: No focal findings, equal strength  Data Reviewed: I have independently reviewed following labs and imaging studies   CBC:  Recent Labs Lab 03/29/17 0617 03/30/17 1028 03/31/17 0313 04/01/17 0425 04/02/17 0436  WBC 7.5 6.8 8.4 5.7 6.0  HGB 8.9* 8.5* 7.5* 7.0* 8.1*  HCT 30.3* 28.0* 25.3* 23.3* 27.2*  MCV 73.0* 72.9* 73.8* 73.3* 74.3*  PLT 368 350 306 291 456   Basic Metabolic Panel:  Recent Labs Lab 03/29/17 0617 03/30/17 1028 03/31/17 0313 04/01/17 0425 04/02/17 0436  NA 129* 132* 131* 132* 135  K 4.6 4.5 4.7 4.6 4.2  CL 102 103 103 102 100*  CO2 22 22 22 24 28   GLUCOSE 145* 121* 262* 190* 171*  BUN <5* <5* 6 6 8   CREATININE 0.86 0.84 0.94 0.83 1.01*  CALCIUM 8.5* 8.7* 8.1* 8.6* 9.0   GFR: Estimated Creatinine Clearance: 57.8 mL/min (A) (by C-G formula based on SCr of 1.01 mg/dL (H)). Liver Function Tests:  Recent Labs Lab 04/01/17 0425  AST 15  ALT 11*  ALKPHOS 69  BILITOT 0.3  PROT 5.5*  ALBUMIN 2.6*   No results for input(s): LIPASE, AMYLASE in the last 168 hours. No results for input(s): AMMONIA in the last 168 hours. Coagulation Profile: No results for input(s): INR, PROTIME in the last 168 hours. Cardiac Enzymes: No results for input(s): CKTOTAL, CKMB, CKMBINDEX, TROPONINI in the last 168 hours. BNP  (last 3 results) No results for input(s): PROBNP in the last 8760 hours. HbA1C: No results for input(s): HGBA1C in the last 72 hours. CBG:  Recent Labs Lab 04/01/17 1651 04/01/17 2033 04/01/17 2355 04/02/17 0422 04/02/17 0743  GLUCAP 289* 238* 241* 190* 126*   Lipid Profile: No results for input(s): CHOL, HDL, LDLCALC, TRIG, CHOLHDL, LDLDIRECT in the last 72 hours. Thyroid Function Tests: No results for input(s): TSH, T4TOTAL, FREET4, T3FREE, THYROIDAB in the last 72 hours. Anemia Panel: No results for input(s): VITAMINB12, FOLATE, FERRITIN, TIBC, IRON, RETICCTPCT in the last 72 hours. Urine analysis:    Component Value Date/Time   COLORURINE YELLOW 03/24/2017 2359   APPEARANCEUR CLEAR 03/24/2017 2359   LABSPEC 1.020 03/24/2017 2359   PHURINE 7.0 03/24/2017 2359   GLUCOSEU >=500 (A) 03/24/2017 [place] (A) 03/24/2017 2359   BILIRUBINUR NEGATIVE 03/24/2017 2359   BILIRUBINUR neg 01/17/2017 1533   KETONESUR 15 (A) 03/24/2017 2359  PROTEINUR 100 (A) 03/24/2017 2359   UROBILINOGEN 0.2 01/17/2017 1533   UROBILINOGEN 0.2 04/27/2015 1608   NITRITE NEGATIVE 03/24/2017 2359   LEUKOCYTESUR NEGATIVE 03/24/2017 2359   Sepsis Labs: Invalid input(s): PROCALCITONIN, LACTICIDVEN  Recent Results (from the past 240 hour(s))  MRSA PCR Screening     Status: None   Collection Time: 03/25/17  4:21 AM  Result Value Ref Range Status   MRSA by PCR NEGATIVE NEGATIVE Final    Comment:        The GeneXpert MRSA Assay (FDA approved for NASAL specimens only), is one component of a comprehensive MRSA colonization surveillance program. It is not intended to diagnose MRSA infection nor to guide or monitor treatment for MRSA infections.   Culture, blood (routine x 2)     Status: None   Collection Time: 03/25/17 11:17 AM  Result Value Ref Range Status   Specimen Description BLOOD LEFT WRIST  Final   Special Requests IN PEDIATRIC BOTTLE Blood Culture adequate volume  Final    Culture NO GROWTH 5 DAYS  Final   Report Status 03/30/2017 FINAL  Final  Culture, blood (routine x 2)     Status: None   Collection Time: 03/25/17 11:49 AM  Result Value Ref Range Status   Specimen Description BLOOD LEFT HAND  Final   Special Requests IN PEDIATRIC BOTTLE Blood Culture adequate volume  Final   Culture NO GROWTH 5 DAYS  Final   Report Status 03/30/2017 FINAL  Final      Radiology Studies: No results found.   Scheduled Meds: . benztropine mesylate  1 mg Intravenous BID  . chlorhexidine  15 mL Mouth Rinse BID  . clonazepam  0.5 mg Per Tube BID  . erythromycin  400 mg Per Tube Q8H  . heparin  5,000 Units Subcutaneous Q8H  . insulin aspart  0-9 Units Subcutaneous Q4H  . insulin detemir  8 Units Subcutaneous BID  . mouth rinse  15 mL Mouth Rinse q12n4p  . nystatin   Topical BID  . pantoprazole  40 mg Oral Q0600  . promethazine  25 mg Rectal Q8H  . sodium chloride flush  10-40 mL Intracatheter Q12H   Continuous Infusions: . dextrose 5 % and 0.45 % NaCl with KCl 40 mEq/L 1,000 mL (03/31/17 1737)  . ondansetron (ZOFRAN) IV Stopped (04/02/17 0762)  . potassium chloride      Marzetta Board, MD, PhD [facility] Pager [phone]  If 7PM-7AM, please contact night-coverage [email or web address] Password [email or web address] 04/02/2017, 10:23 AM

## 2017-04-02 NOTE — Progress Notes (Signed)
[facility] notified that patient is requesting antianxiety medication. Arthor Captain LPN

## 2017-04-03 LAB — GLUCOSE, CAPILLARY
GLUCOSE-CAPILLARY: 87 mg/dL (ref 65–99)
GLUCOSE-CAPILLARY: 137 mg/dL — AB (ref 65–99)
GLUCOSE-CAPILLARY: 245 mg/dL — AB (ref 65–99)
Glucose-Capillary: 154 mg/dL — ABNORMAL HIGH (ref 65–99)
Glucose-Capillary: 192 mg/dL — ABNORMAL HIGH (ref 65–99)
Glucose-Capillary: 244 mg/dL — ABNORMAL HIGH (ref 65–99)
Glucose-Capillary: 49 mg/dL — ABNORMAL LOW (ref 65–99)

## 2017-04-03 LAB — CBC
HEMATOCRIT: 27.9 % — AB (ref 36.0–46.0)
HEMOGLOBIN: 8.2 g/dL — AB (ref 12.0–15.0)
MCH: 21.9 pg — ABNORMAL LOW (ref 26.0–34.0)
MCHC: 29.4 g/dL — AB (ref 30.0–36.0)
MCV: 74.6 fL — ABNORMAL LOW (ref 78.0–100.0)
Platelets: 280 10*3/uL (ref 150–400)
RBC: 3.74 MIL/uL — ABNORMAL LOW (ref 3.87–5.11)
RDW: 17.3 % — ABNORMAL HIGH (ref 11.5–15.5)
WBC: 5.1 10*3/uL (ref 4.0–10.5)

## 2017-04-03 LAB — BASIC METABOLIC PANEL
Anion gap: 6 (ref 5–15)
BUN: 10 mg/dL (ref 6–20)
CHLORIDE: 101 mmol/L (ref 101–111)
CO2: 29 mmol/L (ref 22–32)
Calcium: 8.6 mg/dL — ABNORMAL LOW (ref 8.9–10.3)
Creatinine, Ser: 0.87 mg/dL (ref 0.44–1.00)
Glucose, Bld: 167 mg/dL — ABNORMAL HIGH (ref 65–99)
POTASSIUM: 4.1 mmol/L (ref 3.5–5.1)
SODIUM: 136 mmol/L (ref 135–145)

## 2017-04-03 MED ORDER — FLUCONAZOLE 100 MG PO TABS
150.0000 mg | ORAL_TABLET | Freq: Once | ORAL | Status: AC
  Administered 2017-04-03: 150 mg via ORAL
  Filled 2017-04-03: qty 2

## 2017-04-03 MED ORDER — ERYTHROMYCIN ETHYLSUCCINATE 400 MG/5ML PO SUSR: 400.0000 mg | Freq: Three times a day (TID) | ORAL | 0 refills | Status: DC

## 2017-04-03 MED ORDER — PROCHLORPERAZINE MALEATE 10 MG PO TABS
10.0000 mg | ORAL_TABLET | Freq: Four times a day (QID) | ORAL | Status: DC | PRN
  Filled 2017-04-03: qty 1

## 2017-04-03 MED ORDER — CLONAZEPAM 0.125 MG PO TBDP: 0.5000 mg | ORAL_TABLET | Freq: Two times a day (BID) | ORAL | Status: DC

## 2017-04-03 MED ORDER — INFLUENZA VAC SPLIT QUAD 0.5 ML IM SUSY
0.5000 mL | PREFILLED_SYRINGE | Freq: Once | INTRAMUSCULAR | Status: AC
  Administered 2017-04-03: 0.5 mL via INTRAMUSCULAR

## 2017-04-03 MED ORDER — BENZTROPINE MESYLATE 1 MG PO TABS: 1.0000 mg | ORAL_TABLET | Freq: Two times a day (BID) | ORAL | 0 refills | Status: DC

## 2017-04-03 MED ORDER — INSULIN ASPART 100 UNIT/ML ~~LOC~~ SOLN
0.0000 [IU] | Freq: Three times a day (TID) | SUBCUTANEOUS | Status: DC
  Administered 2017-04-03: 1 [IU] via SUBCUTANEOUS
  Administered 2017-04-03: 3 [IU] via SUBCUTANEOUS

## 2017-04-03 MED ORDER — ONDANSETRON 8 MG PO TBDP: 8.0000 mg | ORAL_TABLET | Freq: Three times a day (TID) | ORAL | 0 refills | Status: DC | PRN

## 2017-04-03 MED ORDER — PROMETHAZINE HCL 25 MG RE SUPP: 25.0000 mg | Freq: Four times a day (QID) | RECTAL | 0 refills | Status: DC | PRN

## 2017-04-03 MED ORDER — CLONAZEPAM 0.5 MG PO TBDP: 0.5000 mg | ORAL_TABLET | Freq: Two times a day (BID) | ORAL | 0 refills | Status: DC

## 2017-04-03 MED ORDER — BENZTROPINE MESYLATE 1 MG PO TABS
1.0000 mg | ORAL_TABLET | Freq: Two times a day (BID) | ORAL | Status: DC
  Administered 2017-04-03: 1 mg via ORAL
  Filled 2017-04-03: qty 1

## 2017-04-03 MED ORDER — FINASTERIDE 5 MG PO TABS: 5.0000 mg | ORAL_TABLET | Freq: Every day | ORAL | 0 refills | Status: DC

## 2017-04-03 MED ORDER — GLUCERNA SHAKE PO LIQD
237.0000 mL | Freq: Two times a day (BID) | ORAL | Status: DC
  Administered 2017-04-03: 237 mL via ORAL

## 2017-04-03 MED ORDER — SODIUM CHLORIDE 0.9 % IV SOLN
510.0000 mg | Freq: Once | INTRAVENOUS | Status: AC
  Administered 2017-04-03: 510 mg via INTRAVENOUS
  Filled 2017-04-03: qty 17

## 2017-04-03 MED ORDER — PROMETHAZINE HCL 25 MG PO TABS: 25.0000 mg | ORAL_TABLET | Freq: Three times a day (TID) | ORAL | Status: DC | PRN

## 2017-04-03 MED ORDER — PANTOPRAZOLE SODIUM 40 MG PO TBEC: 40.0000 mg | DELAYED_RELEASE_TABLET | Freq: Every day | ORAL | 0 refills | Status: DC

## 2017-04-03 MED ORDER — ONDANSETRON 4 MG PO TBDP: 8.0000 mg | ORAL_TABLET | Freq: Three times a day (TID) | ORAL | Status: DC | PRN

## 2017-04-03 MED ORDER — INSULIN DETEMIR 100 UNIT/ML ~~LOC~~ SOLN: 8.0000 [IU] | Freq: Two times a day (BID) | SUBCUTANEOUS | 0 refills | Status: DC

## 2017-04-03 MED ORDER — ONDANSETRON 8 MG PO TBDP: 8.0000 mg | ORAL_TABLET | Freq: Three times a day (TID) | ORAL | 0 refills | Status: DC

## 2017-04-03 NOTE — Progress Notes (Signed)
Patient will DC to: [facility] Anticipated DC date: 04/03/17 Family notified: Patient notifying sister Transport by: Corey Harold   Per MD patient ready for DC to [place]. RN, patient, patient's family, and facility notified of DC. Discharge Summary sent to facility. RN given number for report [phone] Room 117a). DC packet on chart. Ambulance transport requested for patient.   CSW signing off.  Cedric Fishman, [place] Social Worker [phone]

## 2017-04-03 NOTE — Clinical Social Work Placement (Signed)
   CLINICAL SOCIAL WORK PLACEMENT  NOTE  Date:  04/03/2017  Patient Details  Name: [name] MRN: [id number] Date of Birth: 09-18-1965  Clinical Social Work is seeking post-discharge placement for this patient at the [place] level of care (*CSW will initial, date and re-position this form in  chart as items are completed):  Yes   Patient/family provided with [place] Work Department's list of facilities offering this level of care within the geographic area requested by the patient (or if unable, by the patient's family).  Yes   Patient/family informed of their freedom to choose among providers that offer the needed level of care, that participate in Medicare, Medicaid or managed care program needed by the patient, have an available bed and are willing to accept the patient.  Yes   Patient/family informed of [facility]'s ownership interest in [facility] and [facility], as well as of the fact that they are under no obligation to receive care at these facilities.  PASRR submitted to EDS on       PASRR number received on       Existing PASRR number confirmed on 04/03/17     FL2 transmitted to all facilities in geographic area requested by pt/family on 04/03/17     FL2 transmitted to all facilities within larger geographic area on       Patient informed that his/her managed care company has contracts with or will negotiate with certain facilities, including the following:        Yes   Patient/family informed of bed offers received.  Patient chooses bed at [place]     Physician recommends and patient chooses bed at      Patient to be transferred to [facility] on 04/03/17.  Patient to be transferred to facility by [facility]     Patient family notified on 04/03/17 of transfer.  Name of family member notified:  Patient notifying them     PHYSICIAN       Additional Comment:     _______________________________________________ Benard Halsted, [place] 04/03/2017, 3:45 PM

## 2017-04-03 NOTE — Progress Notes (Signed)
Report called to [facility] and given to  [place], Therapist, sports.

## 2017-04-03 NOTE — Progress Notes (Signed)
Nutrition Follow-up  DOCUMENTATION CODES:   Not applicable  INTERVENTION:   -Glucerna Shake po BID, each supplement provides 220 kcal and 10 grams of protein  NUTRITION DIAGNOSIS:   Inadequate oral intake related to nausea, vomiting, chronic illness (Severe Gastroparesis) as evidenced by  (Pt report of no meal intake x 4 days).  Progressing  GOAL:   Patient will meet greater than or equal to 90% of their needs  Progressing  MONITOR:   PO intake, Supplement acceptance, Labs, Weight trends, Skin, I & O's  REASON FOR ASSESSMENT:   Consult Enteral/tube feeding initiation and management  ASSESSMENT:   51 YEAR old lady with prior h/o IDDM, multiple admissions for DKA, gastroparesis comes in for rigidity, nausea, and vomiting for a few days. She was admitted to [facility] service for DKA as well as acute dystonic reaction related to Reglan.  Her   10/8- advanced to full liquids 10/9- feeding tube removed and TF d/c  Spoke with pt, who reports anxiety over possible discharge to SNF today. She reports she is pleased that TF has been discontinued. She is tolerating soft diet, consuming 65-85% of meals. She states she ate eggs, bacon, and juice today without difficulty.   Discussed importance of good meal and supplement intake to promote healing. Pt amenable to supplement for increased nutrient provision.   Labs reviewed: CBGS: 49-154 (current orders for inpatient glycemic control are 0-9 units insulin aspart TID and 8 units insulin determir BID.   Diet Order:  DIET SOFT Room service appropriate? Yes; Fluid consistency: Thin Diet Carb Modified  Skin:  Reviewed, no issues  Last BM:  04/02/17  Height:   Ht Readings from Last 1 Encounters:  03/29/17 5\' 1"  (1.549 m)    Weight:   Wt Readings from Last 1 Encounters:  04/03/17 140 lb 11.2 oz (63.8 kg)    Ideal Body Weight:  47.73 kg  BMI:  Body mass index is 26.59 kg/m.  Estimated Nutritional Needs:   Kcal:  1550-1700  kcals (25-28 kcal/kg bw)  Protein:  73-86g Pro (1.2-1.4 g/kg bw)  Fluid:  1.5-1.7 L fluid ( 32ml/kcal)  EDUCATION NEEDS:   Education needs addressed  Traci Plemons A. Jimmye Norman, RD, LDN, CDE Pager: [phone] After hours Pager: [phone]

## 2017-04-03 NOTE — Discharge Summary (Signed)
Physician Discharge Summary  [name] WNU:[id number] DOB: [birth year] DOA: 03/24/2017  PCP: [name], MD  Admit date: 03/24/2017 Discharge date: 04/03/2017  Admitted From: Home Disposition:  SNF  Recommendations for Outpatient Follow-up:  1. Follow up with PCP in 1 week 2. Reestablish with teritary care GI, Dr Derrill Kay at [facility], [phone], who proposed gastric pacemaker several years ago.   3. Please obtain BMP/CBC in 1 week   Discharge Condition: Stable CODE STATUS: Full  Diet recommendation: Carb modified, small meals  Brief/Interim Summary: 51 YEAR old lady with prior h/o IDDM, multiple admissions for DKA, gastroparesis comes in for rigidity, nausea, and vomiting for a few days. She was admitted to [facility] service for DKA as well as acute dystonic reaction related to Reglan. Her DKA improved, however her gastroparesis is very slow to improve, and due to no p.o. intake eventually underwent an NG tube for feeding on 10/7.  Following that, she was started on erythromycin as well as clonazepam as suggested by psychiatry, and she improved and her diet is slowly advanced.   Discharge Diagnoses:  Active Problems:   Microcytic anemia   Anxiety and depression   Anemia, iron deficiency   DKA, type 1, not at goal [facility])   Acute dystonia due to drugs   Acute dystonic reaction -This is improving, neurology and psychiatry evaluated patient while hospitalized, she was placed on Cogentin, continue -In the acute phase she was briefly on Benadryl but this was discontinued due to concern for excessive sedation -For now hold her psychiatric medications per psychiatry, recommended clonazepam for her anxiety  Intractable nausea vomiting due to severe gastroparesis -Last time she was hospitalized she was discharged on Reglan which may explain #1 -Due to persistent symptoms, GI was consulted. She has been seen by gastroenterology at [facility] as well and was advanced point evaluated for  stimulator, but this was denied by her insurance -Cortrack placed 10/6 for severe intractable gastroparesis, now removed and tolerating soft diet  -Scheduled Zofran, prn phenergan, erythromycin, PPI   Microcytic anemia -Due to chronic disease/iron deficiency, patient with history of heavy menses, no evidence of bleeding currently, transfused 10/8 for a hemoglobin of 7.0, improved  -1 dose IV feraheme today  Anxiety / depression -Consulted psychiatry for further recommendations, recommends clonazepam, Cogentin as needed, recommending against antipsychotic medications at this time  Urinary retention  -Likely in the setting of antipsychotic medications/bedrest, she had a Foley placed while she was in the ICU, she failed a voiding trial in stepdown and required Foley to be placed again around 10/5, another voiding trial underway, Foley removed on 10/8   SIRS -There was concern for infection on admission, but no source was confirmed, she was initially placed on vancomycin and cefepime which were discontinued on 10/4. Blood cultures without growth.  Insulin-dependent diabetes mellitus with recurrent DKA's -? Non compliance, DKA has now resolved -Poorly controlled with hemoglobin A1c around 10  HTN -Hold Lisinopril, blood pressure stable   Discharge Instructions  Discharge Instructions    Call MD for:  difficulty breathing, headache or visual disturbances    Complete by:  As directed    Call MD for:  extreme fatigue    Complete by:  As directed    Call MD for:  hives    Complete by:  As directed    Call MD for:  persistant dizziness or light-headedness    Complete by:  As directed    Call MD for:  persistant nausea and vomiting  Complete by:  As directed    Call MD for:  severe uncontrolled pain    Complete by:  As directed    Call MD for:  temperature >100.4    Complete by:  As directed    Diet Carb Modified    Complete by:  As directed    Discharge instructions     Complete by:  As directed    You were cared for by a hospitalist during your hospital stay. If you have any questions about your discharge medications or the care you received while you were in the hospital after you are discharged, you can call the unit and asked to speak with the hospitalist on call if the hospitalist that took care of you is not available. Once you are discharged, your primary care physician will handle any further medical issues. Please note that NO REFILLS for any discharge medications will be authorized once you are discharged, as it is imperative that you return to your primary care physician (or establish a relationship with a primary care physician if you do not have one) for your aftercare needs so that they can reassess your need for medications and monitor your lab values.   Increase activity slowly    Complete by:  As directed      Allergies as of 04/03/2017      Reactions   Anesthetics, Amide Nausea And Vomiting   Penicillins Diarrhea, Nausea And Vomiting   Has patient had a PCN reaction causing immediate rash, facial/tongue/throat swelling, SOB or lightheadedness with hypotension: No Has patient had a PCN reaction causing severe rash involving mucus membranes or skin necrosis: No Has patient had a PCN reaction that required hospitalization No Has patient had a PCN reaction occurring within the last 10 years: Yes  If all of the above answers are "NO", then may proceed with Cephalosporin use.   Buprenorphine Hcl Rash   Encainide Nausea And Vomiting   Metoclopramide Other (See Comments)   Dystonia, muscle rigidity.       Medication List    STOP taking these medications   albuterol 108 (90 Base) MCG/ACT inhaler Commonly known as:  PROVENTIL HFA;VENTOLIN HFA   atorvastatin 40 MG tablet Commonly known as:  LIPITOR   busPIRone 10 MG tablet Commonly known as:  BUSPAR   famotidine 20 MG tablet Commonly known as:  PEPCID   ferrous sulfate 325 (65 FE) MG  tablet   FLUoxetine 20 MG tablet Commonly known as:  PROZAC   insulin aspart 100 UNIT/ML injection Commonly known as:  novoLOG   lisinopril 5 MG tablet Commonly known as:  PRINIVIL,ZESTRIL   LORazepam 1 MG tablet Commonly known as:  ATIVAN   metoCLOPramide 5 MG/5ML solution Commonly known as:  REGLAN   polyethylene glycol packet Commonly known as:  MIRALAX / GLYCOLAX   pregabalin 150 MG capsule Commonly known as:  LYRICA   vitamin B-12 1000 MCG tablet Commonly known as:  CYANOCOBALAMIN     TAKE these medications   ACCU-CHEK AVIVA device Use as instructed 3 times daily before meals   accu-chek softclix lancets Use as instructed 3 times daily before meals   benztropine 1 MG tablet Commonly known as:  COGENTIN Take 1 tablet (1 mg total) by mouth 2 (two) times daily.   clonazePAM 0.5 MG disintegrating tablet Commonly known as:  KLONOPIN Take 1 tablet (0.5 mg total) by mouth 2 (two) times daily.   erythromycin 400 MG/5ML suspension Commonly known as:  EES Take 5  mLs (400 mg total) by mouth every 8 (eight) hours.   finasteride 5 MG tablet Commonly known as:  PROSCAR Take 1 tablet (5 mg total) by mouth daily.   furosemide 40 MG tablet Commonly known as:  LASIX Take 40 mg by mouth daily as needed for fluid.   glucose blood test strip Commonly known as:  ACCU-CHEK AVIVA Use as instructed 3 times daily before meals   insulin detemir 100 UNIT/ML injection Commonly known as:  LEVEMIR Inject 0.08 mLs (8 Units total) into the skin 2 (two) times daily. What changed:  how much to take   Insulin Syringe-Needle U-100 31G X 15/64" 0.5 ML Misc Commonly known as:  BD INSULIN SYRINGE ULTRAFINE 1 each by Does not apply route 4 (four) times daily as needed.   ondansetron 8 MG disintegrating tablet Commonly known as:  ZOFRAN-ODT Take 1 tablet (8 mg total) by mouth every 8 (eight) hours. What changed:  medication strength  how much to take  when to take  this  reasons to take this   pantoprazole 40 MG tablet Commonly known as:  PROTONIX Take 1 tablet (40 mg total) by mouth daily.   promethazine 25 MG suppository Commonly known as:  PHENERGAN Place 1 suppository (25 mg total) rectally every 6 (six) hours as needed for nausea.      Follow-up Information    [facility] Follow up.   Contact information: [address] [place] [address] [phone]       Beverly Gust, MD. Schedule an appointment as soon as possible for a visit.   Specialty:  Internal Medicine Why:  Gastroenterologist specializing in gastroparesis, motility disorders Contact information: [address] [place] [address] [phone]          Allergies  Allergen Reactions  . Anesthetics, Amide Nausea And Vomiting  . Penicillins Diarrhea and Nausea And Vomiting    Has patient had a PCN reaction causing immediate rash, facial/tongue/throat swelling, SOB or lightheadedness with hypotension: No Has patient had a PCN reaction causing severe rash involving mucus membranes or skin necrosis: No Has patient had a PCN reaction that required hospitalization No Has patient had a PCN reaction occurring within the last 10 years: Yes  If all of the above answers are "NO", then may proceed with Cephalosporin use.   . Buprenorphine Hcl Rash  . Encainide Nausea And Vomiting  . Metoclopramide Other (See Comments)    Dystonia, muscle rigidity.     Consultations:  Neurology  GI  Psychiatry   Procedures/Studies: Ir Fluoro Guide Cv Line Right  Result Date: 03/25/2017 INDICATION: Diabetes, DKA, access requested for IV therapy and hydration. Poor venous access. EXAM: ULTRASOUND AND FLUOROSCOPIC GUIDED PICC LINE INSERTION MEDICATIONS: 1% lidocaine CONTRAST:  None FLUOROSCOPY TIME:  Thirty seconds (5 mGy) COMPLICATIONS: None immediate. TECHNIQUE: The procedure, risks, benefits, and alternatives  were explained to the patient and informed written consent was obtained. A timeout was performed prior to the initiation of the procedure. The right upper extremity was prepped with chlorhexidine in a sterile fashion, and a sterile drape was applied covering the operative field. Maximum barrier sterile technique with sterile gowns and gloves were used for the procedure. A timeout was performed prior to the initiation of the procedure. Local anesthesia was provided with 1% lidocaine. Under direct ultrasound guidance, the right brachial vein was accessed with a micropuncture kit after the overlying soft tissues were anesthetized with 1% lidocaine. An ultrasound image  was saved for documentation purposes. A guidewire was advanced to the level of the superior caval-atrial junction for measurement purposes and the PICC line was cut to length. A peel-away sheath was placed and a 34 cm, 5 [place], dual lumen was inserted to level of the superior caval-atrial junction. A post procedure spot fluoroscopic was obtained. The catheter easily aspirated and flushed and was sutured in place. A dressing was placed. The patient tolerated the procedure well without immediate post procedural complication. FINDINGS: After catheter placement, the tip lies within the superior cavoatrial junction. The catheter aspirates and flushes normally and is ready for immediate use. IMPRESSION: Successful ultrasound and fluoroscopic guided placement of a right brachial vein approach, 34 cm, 5 French, dual lumen PICC with tip at the superior caval-atrial junction. The PICC line is ready for immediate use. Read by: Saverio Danker, PA-C Electronically Signed   By: Marybelle Killings M.D.   On: 03/25/2017 15:44   Ir Fluoro Guide Cv Line Right  Result Date: 03/06/2017 INDICATION: DIABETES, DKA, ACCESS FOR IV THERAPY AND HYDRATION EXAM: ULTRASOUND AND FLUOROSCOPIC GUIDED PICC LINE INSERTION MEDICATIONS: 1% PROCAINE LOCALLY CONTRAST:  None FLUOROSCOPY TIME:   Six seconds (less than 1 mGy) COMPLICATIONS: None immediate. TECHNIQUE: The procedure, risks, benefits, and alternatives were explained to the patient and informed written consent was obtained. A timeout was performed prior to the initiation of the procedure. The right upper extremity was prepped with chlorhexidine in a sterile fashion, and a sterile drape was applied covering the operative field. Maximum barrier sterile technique with sterile gowns and gloves were used for the procedure. A timeout was performed prior to the initiation of the procedure. Local anesthesia was provided with 1% lidocaine. Under direct ultrasound guidance, the right brachial vein was accessed with a micropuncture kit after the overlying soft tissues were anesthetized with 1% lidocaine. An ultrasound image was saved for documentation purposes. A guidewire was advanced to the level of the superior caval-atrial junction for measurement purposes and the PICC line was cut to length. A peel-away sheath was placed and a 33 cm, 5 [place], dual lumen was inserted to level of the superior caval-atrial junction. A post procedure spot fluoroscopic was obtained. The catheter easily aspirated and flushed and was sutured in place. A dressing was placed. The patient tolerated the procedure well without immediate post procedural complication. FINDINGS: After catheter placement, the tip lies within the superior cavoatrial junction. The catheter aspirates and flushes normally and is ready for immediate use. IMPRESSION: Successful ultrasound and fluoroscopic guided placement of a right brachial vein approach, 33 cm, 5 French, dual lumen PICC with tip at the superior caval-atrial junction. The PICC line is ready for immediate use. Electronically Signed   By: Jerilynn Mages.  Shick M.D.   On: 03/06/2017 12:24   Ir US Guide Vasc Access Right  Result Date: 03/25/2017 INDICATION: Diabetes, DKA, access requested for IV therapy and hydration. Poor venous access. EXAM:  ULTRASOUND AND FLUOROSCOPIC GUIDED PICC LINE INSERTION MEDICATIONS: 1% lidocaine CONTRAST:  None FLUOROSCOPY TIME:  Thirty seconds (5 mGy) COMPLICATIONS: None immediate. TECHNIQUE: The procedure, risks, benefits, and alternatives were explained to the patient and informed written consent was obtained. A timeout was performed prior to the initiation of the procedure. The right upper extremity was prepped with chlorhexidine in a sterile fashion, and a sterile drape was applied covering the operative field. Maximum barrier sterile technique with sterile gowns and gloves were used for the procedure. A timeout was performed prior to the initiation of  the procedure. Local anesthesia was provided with 1% lidocaine. Under direct ultrasound guidance, the right brachial vein was accessed with a micropuncture kit after the overlying soft tissues were anesthetized with 1% lidocaine. An ultrasound image was saved for documentation purposes. A guidewire was advanced to the level of the superior caval-atrial junction for measurement purposes and the PICC line was cut to length. A peel-away sheath was placed and a 34 cm, 5 [place], dual lumen was inserted to level of the superior caval-atrial junction. A post procedure spot fluoroscopic was obtained. The catheter easily aspirated and flushed and was sutured in place. A dressing was placed. The patient tolerated the procedure well without immediate post procedural complication. FINDINGS: After catheter placement, the tip lies within the superior cavoatrial junction. The catheter aspirates and flushes normally and is ready for immediate use. IMPRESSION: Successful ultrasound and fluoroscopic guided placement of a right brachial vein approach, 34 cm, 5 French, dual lumen PICC with tip at the superior caval-atrial junction. The PICC line is ready for immediate use. Read by: Saverio Danker, PA-C Electronically Signed   By: Marybelle Killings M.D.   On: 03/25/2017 15:44   Ir US Guide Vasc  Access Right  Result Date: 03/06/2017 INDICATION: DIABETES, DKA, ACCESS FOR IV THERAPY AND HYDRATION EXAM: ULTRASOUND AND FLUOROSCOPIC GUIDED PICC LINE INSERTION MEDICATIONS: 1% PROCAINE LOCALLY CONTRAST:  None FLUOROSCOPY TIME:  Six seconds (less than 1 mGy) COMPLICATIONS: None immediate. TECHNIQUE: The procedure, risks, benefits, and alternatives were explained to the patient and informed written consent was obtained. A timeout was performed prior to the initiation of the procedure. The right upper extremity was prepped with chlorhexidine in a sterile fashion, and a sterile drape was applied covering the operative field. Maximum barrier sterile technique with sterile gowns and gloves were used for the procedure. A timeout was performed prior to the initiation of the procedure. Local anesthesia was provided with 1% lidocaine. Under direct ultrasound guidance, the right brachial vein was accessed with a micropuncture kit after the overlying soft tissues were anesthetized with 1% lidocaine. An ultrasound image was saved for documentation purposes. A guidewire was advanced to the level of the superior caval-atrial junction for measurement purposes and the PICC line was cut to length. A peel-away sheath was placed and a 33 cm, 5 [place], dual lumen was inserted to level of the superior caval-atrial junction. A post procedure spot fluoroscopic was obtained. The catheter easily aspirated and flushed and was sutured in place. A dressing was placed. The patient tolerated the procedure well without immediate post procedural complication. FINDINGS: After catheter placement, the tip lies within the superior cavoatrial junction. The catheter aspirates and flushes normally and is ready for immediate use. IMPRESSION: Successful ultrasound and fluoroscopic guided placement of a right brachial vein approach, 33 cm, 5 French, dual lumen PICC with tip at the superior caval-atrial junction. The PICC line is ready for immediate use.  Electronically Signed   By: Jerilynn Mages.  Shick M.D.   On: 03/06/2017 12:24   Dg Chest Port 1 View  Result Date: 03/25/2017 CLINICAL DATA:  Sepsis, diabetes. EXAM: PORTABLE CHEST 1 VIEW COMPARISON:  Portable chest x-ray of March 07, 2017 FINDINGS: The lungs are adequately inflated and clear. The heart and mediastinal structures are normal. There is no pleural effusion. The bony thorax is unremarkable. The right-sided PICC line has been removed. IMPRESSION: There is no active cardiopulmonary disease. Electronically Signed   By: David  Martinique M.D.   On: 03/25/2017 07:18   Dg Chest  Port 1 View  Result Date: 03/07/2017 CLINICAL DATA:  Cough. EXAM: PORTABLE CHEST 1 VIEW COMPARISON:  01/31/2017. FINDINGS: Right PICC line in stable position. Heart size normal. Mild right base subsegmental atelectasis. No focal infiltrate. No pleural effusion or pneumothorax. Thoracic spine scoliosis. IMPRESSION: 1. Right PICC line stable position. 2.  Mild right base subsegmental atelectasis. Electronically Signed   By: Marcello Moores  Register   On: 03/07/2017 06:28   Dg Abd Portable 1v  Result Date: 03/30/2017 CLINICAL DATA:  Encounter for feeding tube placement. EXAM: PORTABLE ABDOMEN - 1 VIEW COMPARISON:  None. FINDINGS: Enteric feeding tube passes well below the diaphragm. Metallic tip projects in the right upper quadrant, likely within the first portion of the duodenum. Normal bowel gas pattern.  Lung bases are clear. IMPRESSION: Enteric feeding tube tip projects in the region of the first portion of the duodenum. Electronically Signed   By: Lajean Manes M.D.   On: 03/30/2017 15:43   Dg Abd Portable 1v  Result Date: 03/25/2017 CLINICAL DATA:  NG tube placement. EXAM: PORTABLE ABDOMEN - 1 VIEW COMPARISON:  02/05/2017 FINDINGS: An enteric tube overlies the stomach with tip and side hole well beyond the GE junction. A moderate amount of stool is again seen throughout the colon. No dilated loops of bowel are seen to suggest  obstruction. Cholecystectomy clips are noted. No acute osseous abnormality is seen. IMPRESSION: Enteric tube in the stomach. Electronically Signed   By: Logan Bores M.D.   On: 03/25/2017 10:27       Discharge Exam: Vitals:   04/02/17 2015 04/03/17 0449  BP: (!) 118/42 (!) 117/48  Pulse: 85 81  Resp: 18 16  Temp: 98.4 F (36.9 C) 98.2 F (36.8 C)  SpO2: 98% 98%   Vitals:   04/02/17 0823 04/02/17 1501 04/02/17 2015 04/03/17 0449  BP: (!) 123/51 132/78 (!) 118/42 (!) 117/48  Pulse: 83 87 85 81  Resp: 16 17 18 16   Temp: 99.1 F (37.3 C) 97.8 F (36.6 C) 98.4 F (36.9 C) 98.2 F (36.8 C)  TempSrc: Oral Oral Oral Oral  SpO2: 99% 100% 98% 98%  Weight:    63.8 kg (140 lb 11.2 oz)  Height:        General: Pt is alert, awake, not in acute distress Cardiovascular: RRR, S1/S2 +, no rubs, no gallops Respiratory: CTA bilaterally, no wheezing, no rhonchi Abdominal: Soft, NT, ND, bowel sounds + Extremities: no edema, no cyanosis    The results of significant diagnostics from this hospitalization (including imaging, microbiology, ancillary and laboratory) are listed below for reference.     Microbiology: Recent Results (from the past 240 hour(s))  MRSA PCR Screening     Status: None   Collection Time: 03/25/17  4:21 AM  Result Value Ref Range Status   MRSA by PCR NEGATIVE NEGATIVE Final    Comment:        The GeneXpert MRSA Assay (FDA approved for NASAL specimens only), is one component of a comprehensive MRSA colonization surveillance program. It is not intended to diagnose MRSA infection nor to guide or monitor treatment for MRSA infections.   Culture, blood (routine x 2)     Status: None   Collection Time: 03/25/17 11:17 AM  Result Value Ref Range Status   Specimen Description BLOOD LEFT WRIST  Final   Special Requests IN PEDIATRIC BOTTLE Blood Culture adequate volume  Final   Culture NO GROWTH 5 DAYS  Final   Report Status 03/30/2017 FINAL  Final  Culture, blood  (routine x 2)     Status: None   Collection Time: 03/25/17 11:49 AM  Result Value Ref Range Status   Specimen Description BLOOD LEFT HAND  Final   Special Requests IN PEDIATRIC BOTTLE Blood Culture adequate volume  Final   Culture NO GROWTH 5 DAYS  Final   Report Status 03/30/2017 FINAL  Final     Labs: BNP (last 3 results) No results for input(s): BNP in the last 8760 hours. Basic Metabolic Panel:  Recent Labs Lab 03/30/17 1028 03/31/17 0313 04/01/17 0425 04/02/17 0436 04/03/17 0444  NA 132* 131* 132* 135 136  K 4.5 4.7 4.6 4.2 4.1  CL 103 103 102 100* 101  CO2 22 22 24 28 29   GLUCOSE 121* 262* 190* 171* 167*  BUN <5* 6 6 8 10   CREATININE 0.84 0.94 0.83 1.01* 0.87  CALCIUM 8.7* 8.1* 8.6* 9.0 8.6*   Liver Function Tests:  Recent Labs Lab 04/01/17 0425  AST 15  ALT 11*  ALKPHOS 69  BILITOT 0.3  PROT 5.5*  ALBUMIN 2.6*   No results for input(s): LIPASE, AMYLASE in the last 168 hours. No results for input(s): AMMONIA in the last 168 hours. CBC:  Recent Labs Lab 03/30/17 1028 03/31/17 0313 04/01/17 0425 04/02/17 0436 04/03/17 0444  WBC 6.8 8.4 5.7 6.0 5.1  HGB 8.5* 7.5* 7.0* 8.1* 8.2*  HCT 28.0* 25.3* 23.3* 27.2* 27.9*  MCV 72.9* 73.8* 73.3* 74.3* 74.6*  PLT 350 306 291 285 280   Cardiac Enzymes: No results for input(s): CKTOTAL, CKMB, CKMBINDEX, TROPONINI in the last 168 hours. BNP: Invalid input(s): POCBNP CBG:  Recent Labs Lab 04/03/17 0421 04/03/17 0448 04/03/17 0845 04/03/17 0913 04/03/17 1222  GLUCAP 192* 154* 49* 87 137*   D-Dimer No results for input(s): DDIMER in the last 72 hours. Hgb A1c No results for input(s): HGBA1C in the last 72 hours. Lipid Profile No results for input(s): CHOL, HDL, LDLCALC, TRIG, CHOLHDL, LDLDIRECT in the last 72 hours. Thyroid function studies No results for input(s): TSH, T4TOTAL, T3FREE, THYROIDAB in the last 72 hours.  Invalid input(s): FREET3 Anemia work up No results for input(s): VITAMINB12,  FOLATE, FERRITIN, TIBC, IRON, RETICCTPCT in the last 72 hours. Urinalysis    Component Value Date/Time   COLORURINE YELLOW 03/24/2017 2359   APPEARANCEUR CLEAR 03/24/2017 2359   LABSPEC 1.020 03/24/2017 2359   PHURINE 7.0 03/24/2017 2359   GLUCOSEU >=500 (A) 03/24/2017 2359   HGBUR TRACE (A) 03/24/2017 2359   BILIRUBINUR NEGATIVE 03/24/2017 2359   BILIRUBINUR neg 01/17/2017 1533   KETONESUR 15 (A) 03/24/2017 2359   PROTEINUR 100 (A) 03/24/2017 2359   UROBILINOGEN 0.2 01/17/2017 1533   UROBILINOGEN 0.2 04/27/2015 1608   NITRITE NEGATIVE 03/24/2017 2359   LEUKOCYTESUR NEGATIVE 03/24/2017 2359   Sepsis Labs Invalid input(s): PROCALCITONIN,  WBC,  LACTICIDVEN Microbiology Recent Results (from the past 240 hour(s))  MRSA PCR Screening     Status: None   Collection Time: 03/25/17  4:21 AM  Result Value Ref Range Status   MRSA by PCR NEGATIVE NEGATIVE Final    Comment:        The GeneXpert MRSA Assay (FDA approved for NASAL specimens only), is one component of a comprehensive MRSA colonization surveillance program. It is not intended to diagnose MRSA infection nor to guide or monitor treatment for MRSA infections.   Culture, blood (routine x 2)     Status: None   Collection Time: 03/25/17 11:17 AM  Result Value Ref  Range Status   Specimen Description BLOOD LEFT WRIST  Final   Special Requests IN PEDIATRIC BOTTLE Blood Culture adequate volume  Final   Culture NO GROWTH 5 DAYS  Final   Report Status 03/30/2017 FINAL  Final  Culture, blood (routine x 2)     Status: None   Collection Time: 03/25/17 11:49 AM  Result Value Ref Range Status   Specimen Description BLOOD LEFT HAND  Final   Special Requests IN PEDIATRIC BOTTLE Blood Culture adequate volume  Final   Culture NO GROWTH 5 DAYS  Final   Report Status 03/30/2017 FINAL  Final     Time coordinating discharge: 40 minutes  SIGNED:  Dessa Phi, DO [facility] Pager [phone]  If 7PM-7AM, please contact  night-coverage [email or web address] Password [email or web address] 04/03/2017, 12:30 PM

## 2017-04-04 ENCOUNTER — Encounter: Payer: Self-pay | Admitting: Adult Health

## 2017-04-04 ENCOUNTER — Other Ambulatory Visit: Payer: Self-pay

## 2017-04-04 ENCOUNTER — Non-Acute Institutional Stay (SKILLED_NURSING_FACILITY): Payer: Medicaid Other | Admitting: Adult Health

## 2017-04-04 DIAGNOSIS — F32A Depression, unspecified: Secondary | ICD-10-CM

## 2017-04-04 DIAGNOSIS — F329 Major depressive disorder, single episode, unspecified: Secondary | ICD-10-CM

## 2017-04-04 DIAGNOSIS — G2401 Drug induced subacute dyskinesia: Secondary | ICD-10-CM | POA: Diagnosis not present

## 2017-04-04 DIAGNOSIS — F419 Anxiety disorder, unspecified: Secondary | ICD-10-CM | POA: Diagnosis not present

## 2017-04-04 DIAGNOSIS — E1010 Type 1 diabetes mellitus with ketoacidosis without coma: Secondary | ICD-10-CM | POA: Diagnosis not present

## 2017-04-04 DIAGNOSIS — D508 Other iron deficiency anemias: Secondary | ICD-10-CM

## 2017-04-04 DIAGNOSIS — E1143 Type 2 diabetes mellitus with diabetic autonomic (poly)neuropathy: Secondary | ICD-10-CM

## 2017-04-04 DIAGNOSIS — G2402 Drug induced acute dystonia: Secondary | ICD-10-CM

## 2017-04-04 DIAGNOSIS — K3184 Gastroparesis: Secondary | ICD-10-CM

## 2017-04-04 DIAGNOSIS — K219 Gastro-esophageal reflux disease without esophagitis: Secondary | ICD-10-CM | POA: Diagnosis not present

## 2017-04-04 DIAGNOSIS — E1040 Type 1 diabetes mellitus with diabetic neuropathy, unspecified: Secondary | ICD-10-CM

## 2017-04-04 MED ORDER — PREGABALIN 50 MG PO CAPS: 50.0000 mg | ORAL_CAPSULE | Freq: Two times a day (BID) | ORAL | 0 refills | Status: DC

## 2017-04-04 MED ORDER — LORAZEPAM 0.5 MG PO TABS: 0.5000 mg | ORAL_TABLET | Freq: Four times a day (QID) | ORAL | 0 refills | Status: DC | PRN

## 2017-04-04 MED ORDER — PREGABALIN 100 MG PO CAPS: 100.0000 mg | ORAL_CAPSULE | Freq: Two times a day (BID) | ORAL | 0 refills | Status: DC

## 2017-04-04 MED ORDER — PREGABALIN 150 MG PO CAPS: 150.0000 mg | ORAL_CAPSULE | Freq: Two times a day (BID) | ORAL | 0 refills | Status: DC

## 2017-04-04 NOTE — Progress Notes (Signed)
Location:   [place] Room Number: 117 A Place of Service:  SNF (31)   CODE STATUS: Full Code  Allergies  Allergen Reactions  . Anesthetics, Amide Nausea And Vomiting  . Penicillins Diarrhea and Nausea And Vomiting    Has patient had a PCN reaction causing immediate rash, facial/tongue/throat swelling, SOB or lightheadedness with hypotension: No Has patient had a PCN reaction causing severe rash involving mucus membranes or skin necrosis: No Has patient had a PCN reaction that required hospitalization No Has patient had a PCN reaction occurring within the last 10 years: Yes  If all of the above answers are "NO", then may proceed with Cephalosporin use.   . Buprenorphine Hcl Rash  . Encainide Nausea And Vomiting  . Metoclopramide Other (See Comments)    Dystonia, muscle rigidity.     Chief Complaint  Patient presents with  . Hospitalization Follow-up    Hospital follow up    HPI:  She is a 51 year old with a history of type I DM with history of dka; was admitted for rigidity nausea vomiting for several days prior to her admission. She was treated acute dystonic reaction; she was placed on cogentin and her psychiatric medications were stopped. She has gastroparesis she was placed on Cortrack on 03-30-17 and has been removed.  She had urinary retention; failed one removal; and has had her foley removed at this time. She is here for short term rehab with her goal to return back home. More than likely she will require either SNF or assisted living. She is not on novolog and will require short acting insulin.   Past Medical History:  Diagnosis Date  . Depression   . Diabetes mellitus without complication ([place])   . Essential hypertension   . Gastroparesis   . GERD (gastroesophageal reflux disease)   . HLD (hyperlipidemia)     Past Surgical History:  Procedure Laterality Date  . COLONOSCOPY  09/27/2014   at [facility]  . ESOPHAGOGASTRODUODENOSCOPY  09/27/2014   at [facility], Dr  Rolan Lipa. biospy neg for celiac, neg for H pylori.   . EYE SURGERY    . gailstones    . IR FLUORO GUIDE CV LINE RIGHT  02/01/2017  . IR FLUORO GUIDE CV LINE RIGHT  03/06/2017  . IR FLUORO GUIDE CV LINE RIGHT  03/25/2017  . IR GENERIC HISTORICAL  01/24/2016   IR FLUORO GUIDE CV LINE RIGHT 01/24/2016 Darrell K Allred, PA-C [facility]  . IR GENERIC HISTORICAL  01/24/2016   IR US GUIDE VASC ACCESS RIGHT 01/24/2016 Darrell K Allred, PA-C [facility]  . IR US GUIDE VASC ACCESS RIGHT  02/01/2017  . IR US GUIDE VASC ACCESS RIGHT  03/06/2017  . IR US GUIDE VASC ACCESS RIGHT  03/25/2017  . POSTERIOR VITRECTOMY AND MEMBRANE PEEL-LEFT EYE  09/28/2002  . POSTERIOR VITRECTOMY AND MEMBRANE PEEL-RIGHT EYE  03/16/2002  . RETINAL DETACHMENT SURGERY      Social History   Social History  . Marital status: Single    Spouse name: N/A  . Number of children: N/A  . Years of education: N/A   Occupational History  . Not on file.   Social History Main Topics  . Smoking status: Never Smoker  . Smokeless tobacco: Never Used  . Alcohol use No  . Drug use: No  . Sexual activity: Not Currently   Other Topics Concern  . Not on file   Social History Narrative  . No narrative on file  Family History  Problem Relation Age of Onset  . Cystic fibrosis Mother   . Hypertension Father   . Diabetes Brother   . Hypertension Maternal Grandmother       VITAL SIGNS BP (!) 178/88   Pulse 100   Temp (!) 97.1 F (36.2 C)   Resp (!) 28   Ht 5\' 2"  (1.575 m)   Wt 135 lb 3.2 oz (61.3 kg)   LMP 03/24/2017   SpO2 97%   BMI 24.73 kg/m   Patient's Medications  New Prescriptions   No medications on file  Previous Medications   BENZTROPINE (COGENTIN) 1 MG TABLET    Take 1 tablet (1 mg total) by mouth 2 (two) times daily.   CLONAZEPAM (KLONOPIN) 0.5 MG DISINTEGRATING TABLET    Take 1 tablet (0.5 mg total) by mouth 2 (two) times daily.   ERYTHROMYCIN (EES) 400 MG/5ML SUSPENSION    Take 5 mLs (400 mg total)  by mouth every 8 (eight) hours.   FINASTERIDE (PROSCAR) 5 MG TABLET    Take 1 tablet (5 mg total) by mouth daily.   FUROSEMIDE (LASIX) 40 MG TABLET    Take 40 mg by mouth daily as needed for fluid.    INSULIN DETEMIR (LEVEMIR) 100 UNIT/ML INJECTION    Inject 0.08 mLs (8 Units total) into the skin 2 (two) times daily.   ONDANSETRON (ZOFRAN-ODT) 8 MG DISINTEGRATING TABLET    Take 1 tablet (8 mg total) by mouth every 8 (eight) hours.   PANTOPRAZOLE (PROTONIX) 40 MG TABLET    Take 1 tablet (40 mg total) by mouth daily.   PROMETHAZINE (PHENERGAN) 25 MG SUPPOSITORY    Place 1 suppository (25 mg total) rectally every 6 (six) hours as needed for nausea.  Modified Medications   No medications on file  Discontinued Medications   BLOOD GLUCOSE MONITORING SUPPL (ACCU-CHEK AVIVA) DEVICE    Use as instructed 3 times daily before meals   GLUCOSE BLOOD (ACCU-CHEK AVIVA) TEST STRIP    Use as instructed 3 times daily before meals   INSULIN SYRINGE-NEEDLE U-100 (BD INSULIN SYRINGE ULTRAFINE) 31G X 15/64" 0.5 ML MISC    1 each by Does not apply route 4 (four) times daily as needed.   LANCET DEVICES (ACCU-CHEK SOFTCLIX) LANCETS    Use as instructed 3 times daily before meals   LORAZEPAM (ATIVAN) 0.5 MG TABLET    Take 1 tablet (0.5 mg total) by mouth every 6 (six) hours as needed for anxiety.   PREGABALIN (LYRICA) 100 MG CAPSULE    Take 1 capsule (100 mg total) by mouth 2 (two) times daily.   PREGABALIN (LYRICA) 150 MG CAPSULE    Take 1 capsule (150 mg total) by mouth 2 (two) times daily.   PREGABALIN (LYRICA) 50 MG CAPSULE    Take 1 capsule (50 mg total) by mouth 2 (two) times daily.     SIGNIFICANT DIAGNOSTIC EXAMS  TODAY:  03-25-17: chest x-ray: There is no active cardiopulmonary disease.  LABS REVIEWED: TODAY:   02-02-17: vit B 12: 252; folate 11.65; iron 91; tibc 343 03-17-17: hgb a1c 10.1 03-24-17: wbc 6.7; hgb 9.5; hct 31.0; mcv 72.8; plt 467; glucose 294; bun 15; creat 0.95; k+ 4.1; na++ 137; ca 9.7;  liver normal albumin 4.2;  03-25-17: blood cultures: no growth; tsh 0.632 03-26-17: wbc 8.2; hgb 8.1; hct 27.2; mcv 73.5; plt 395; glucose 109; bun 7; creat 0.79;k+ 3.5; na++ 137; ca 8.4; mag 1.7; phos 2.6 03-28-17; glucose 176; bun <5;  creat 0.92; k+ 4.3; na++ 131; ca 8.7 03-30-17: wbc 6.8; hgb 8.5; hct 28.0; mcv 72.9; plt 350; glucose 121; bun <5; creat 0.84 ;k+ 4.5; na++ 132; ca 8.7 04-01-17: wbc 5.7; hgb 7.0; hct 23.3; mcv 73.3; plt 291; glucose 190 ;bun 6; creat 0.83; k+ 4.6; n++ 132; ca 8.6; liver normal albumin 2.6 04-03-17: wbc 5.1; hgb 8.2; hct 27.9; mcv 74.6; plt 280; glucose 167; bun 10; creat 0.87; k+ 4.1; na++ 136; ca 8.6    Review of Systems  Constitutional: Negative for malaise/fatigue.  Respiratory: Negative for cough and shortness of breath.   Cardiovascular: Negative for chest pain, palpitations and leg swelling.  Gastrointestinal: Negative for abdominal pain, constipation and heartburn.  Musculoskeletal: Negative for back pain, joint pain and myalgias.  Skin: Negative.   Neurological: Negative for dizziness.       Has bilateral lower extremity neuropathic pain with numbness and tingling present   Psychiatric/Behavioral: The patient is not nervous/anxious.    Physical Exam  Constitutional: No distress.  Thin   Neck: Normal range of motion. Neck supple. No thyromegaly present.  Cardiovascular: Normal rate, regular rhythm and normal heart sounds.   Pulmonary/Chest: Effort normal and breath sounds normal. No respiratory distress.  Abdominal: Soft. Bowel sounds are normal. She exhibits no distension. There is no tenderness.  Musculoskeletal: She exhibits no edema.  Is able to move all extremities   Lymphadenopathy:    She has no cervical adenopathy.  Neurological: She is alert.  Skin: Skin is warm and dry. She is not diaphoretic.    ASSESSMENT/ PLAN:  TODAY:   1. Diabetic neuropathy type I diabetes: hgb a1c 10.1; without change will continue levermir 8 units nightly  will begin novolog SSI: 120-150: 2 units; 151-180: 4 units; 181-210: 5 units; 211-240: 6 units; 241-270: 8 units; 271-300: 9 units; 301-330: 10 units; 331-360: 11 units; 361-400: 21 units; >400 call MD.   2. Urinary retention: no foley at this time; will continue proscar 5 mg daily   3. GERD with gastroparesis due to diabetes: is stable will continue protonix 40 mg daily; and EES 400 mg every 8 hours.   4. Diabetic neuropathy: is worse; will begin lyrica 10 mg twice daily for 5 days; then 100 mg twice daily for 5 days then 150 mg twice daily and will continue to monitor her status.   5. Iron deficiency anemia: stable hgb 8.2; will monitor  6. Anxiety and depression: serotonin syndrome: without change; her psychiatric medications have been stopped in the hospital. Will continue klonopin 0.5 mg twice daily  7.  Dystonic reaction:tardive dyskinesia:  is stable will continue cogentin 1 mg twice daily and will monitor   8. Hypertension: b/p178/88: unchanged: will not change medications at this time will monitor status will make changes as needed in the future.    MD is aware of resident's narcotic use and is in agreement with current plan of care. We will attempt to wean resident as apropriate   Ok Edwards NP [facility]  Contact [phone] Monday through Friday 8am- 5pm  After hours call [phone]

## 2017-04-04 NOTE — Telephone Encounter (Signed)
RX faxed to [facility] @ [phone], phone number [phone] 

## 2017-04-05 ENCOUNTER — Telehealth: Payer: Self-pay | Admitting: Family Medicine

## 2017-04-05 ENCOUNTER — Inpatient Hospital Stay: Payer: Self-pay | Admitting: Family Medicine

## 2017-04-05 NOTE — Telephone Encounter (Signed)
Call placed to [facility] [phone], to check if patient was there and get more information. Spoke with Judeen Hammans and she informed me that the correct person to speak with would be Marcelina Morel, Education officer, museum, regarding patient. Judeen Hammans said that she would give Coralyn Mark my message.

## 2017-04-09 LAB — BLOOD GAS, VENOUS
ACID-BASE DEFICIT: 8.1 mmol/L — AB (ref 0.0–2.0)
Bicarbonate: 15.5 mmol/L — ABNORMAL LOW (ref 20.0–28.0)
O2 Saturation: 67.4 %
PH VEN: 7.374 (ref 7.250–7.430)
Patient temperature: 98.6
pCO2, Ven: 27.2 mmHg — ABNORMAL LOW (ref 44.0–60.0)
pO2, Ven: 38.3 mmHg (ref 32.0–45.0)

## 2017-04-09 LAB — BASIC METABOLIC PANEL
BUN: 15 (ref 4–21)
Creatinine: 1.3 — AB (ref 0.5–1.1)
GLUCOSE: 302
Potassium: 5.5 — AB (ref 3.4–5.3)
Sodium: 134 — AB (ref 137–147)

## 2017-04-09 LAB — HEPATIC FUNCTION PANEL
ALK PHOS: 90 (ref 25–125)
ALT: 18 (ref 7–35)
AST: 24 (ref 13–35)
BILIRUBIN, TOTAL: 0.2

## 2017-04-09 LAB — CBC AND DIFFERENTIAL
HCT: 32 — AB (ref 36–46)
Hemoglobin: 10.6 — AB (ref 12.0–16.0)
NEUTROS ABS: 3
PLATELETS: 277 (ref 150–399)
WBC: 5.7

## 2017-04-11 ENCOUNTER — Other Ambulatory Visit: Payer: Self-pay

## 2017-04-11 MED ORDER — DRONABINOL 5 MG PO CAPS: ORAL_CAPSULE | ORAL | 0 refills | Status: DC

## 2017-04-11 NOTE — Telephone Encounter (Signed)
RX faxed to [facility] @ [phone], phone number [phone] 

## 2017-04-15 ENCOUNTER — Encounter: Payer: Self-pay | Admitting: Internal Medicine

## 2017-04-15 ENCOUNTER — Other Ambulatory Visit: Payer: Self-pay

## 2017-04-15 ENCOUNTER — Non-Acute Institutional Stay (SKILLED_NURSING_FACILITY): Payer: Medicaid Other | Admitting: Internal Medicine

## 2017-04-15 DIAGNOSIS — E878 Other disorders of electrolyte and fluid balance, not elsewhere classified: Secondary | ICD-10-CM

## 2017-04-15 DIAGNOSIS — I10 Essential (primary) hypertension: Secondary | ICD-10-CM | POA: Diagnosis not present

## 2017-04-15 DIAGNOSIS — F32A Depression, unspecified: Secondary | ICD-10-CM

## 2017-04-15 DIAGNOSIS — E43 Unspecified severe protein-calorie malnutrition: Secondary | ICD-10-CM | POA: Diagnosis not present

## 2017-04-15 DIAGNOSIS — E1143 Type 2 diabetes mellitus with diabetic autonomic (poly)neuropathy: Secondary | ICD-10-CM | POA: Diagnosis not present

## 2017-04-15 DIAGNOSIS — D509 Iron deficiency anemia, unspecified: Secondary | ICD-10-CM

## 2017-04-15 DIAGNOSIS — K3184 Gastroparesis: Secondary | ICD-10-CM

## 2017-04-15 DIAGNOSIS — K5900 Constipation, unspecified: Secondary | ICD-10-CM | POA: Diagnosis not present

## 2017-04-15 DIAGNOSIS — F419 Anxiety disorder, unspecified: Secondary | ICD-10-CM

## 2017-04-15 DIAGNOSIS — F329 Major depressive disorder, single episode, unspecified: Secondary | ICD-10-CM | POA: Diagnosis not present

## 2017-04-15 DIAGNOSIS — G249 Dystonia, unspecified: Secondary | ICD-10-CM | POA: Diagnosis not present

## 2017-04-15 DIAGNOSIS — E1040 Type 1 diabetes mellitus with diabetic neuropathy, unspecified: Secondary | ICD-10-CM | POA: Diagnosis not present

## 2017-04-15 LAB — CBC AND DIFFERENTIAL
HCT: 34 — AB (ref 36–46)
HEMOGLOBIN: 10.7 — AB (ref 12.0–16.0)
Neutrophils Absolute: 5
Platelets: 297 (ref 150–399)
WBC: 7.2

## 2017-04-15 LAB — BASIC METABOLIC PANEL
BUN: 14 (ref 4–21)
CREATININE: 1 (ref 0.5–1.1)
GLUCOSE: 230
POTASSIUM: 4.8 (ref 3.4–5.3)
SODIUM: 138 (ref 137–147)

## 2017-04-15 MED ORDER — PREGABALIN 150 MG PO CAPS: 150.0000 mg | ORAL_CAPSULE | Freq: Two times a day (BID) | ORAL | 0 refills | Status: DC

## 2017-04-15 MED ORDER — CLONAZEPAM 0.5 MG PO TBDP: 0.5000 mg | ORAL_TABLET | Freq: Two times a day (BID) | ORAL | 0 refills | Status: DC

## 2017-04-15 NOTE — Progress Notes (Signed)
Patient ID: [name], female   DOB: 06/02/1966, 51 y.o.   MRN: [id number]     HISTORY AND PHYSICAL   DATE:  April 15, 2017  Location:   [place] Room Number: 127 A Place of Service: SNF (31)   Extended Emergency Contact Information Primary Emergency Contact: Donato Schultz Address: [place] Phone: [phone] Mobile Phone: [phone] Relation: Sister Secondary Emergency Contact: Bethann Punches Address: [address]           [place], [place] [address] [place] [employer]: [phone] Relation: Son  Advanced Directive information Does Patient Have a Medical Advance Directive?: No, Would patient like information on creating a medical advance directive?: No - Patient declined  Chief Complaint  Patient presents with  . New Admit To SNF    Admission    HPI:  51 yo female seen today as a new admission into SNF following hospital stay for DKA, acute dystonia 2/2 drug (reglan), anemia, anxiety/depression, severe gastroparesis, s/p urinary retention and HTN. She presented to the ED with rigidity, N/V. She was dx with dystonia 2/2 reglan. She was seen by neurology and psych. She req'd NG tube feeding but po intake improved with clonazepm and erythromycin. She had urinary retention, foley placed then failed voiding trial and foley reinserted. She did pass voiding trial and foley successfully d/c'd. Hgb dropped 7-->8.2; MCV 74.6; albumin 2.6; glucose peaked at 387; K peaked 6.1-->4.1; Cr 0.87; A1c 10.1%. She presents to SNF for short term rehab.  Today she reports constipation. No recent BM. No N/V. She denies hallucinations. Menses started today. Nursing reports increased delusions. CBC and BMP drawn this AM. UA held 2/2 menses. Labs on 10/16th revealed Cr 1.3 with K 5.5.  DM - uncontrolled. A1c 10.1%. Takes levemir 8 units BID. She has neuropathy and takes lyrica. She has severe gastroparesis and takes zofran  odt, phenergan suppository  HTN - stable on lasix. Cr 1.3  Anemia - microcytic. Hgb 8.2 at d/c.   Anxiety/depression - stable on clonazepam. Acute dystonia resolved. She takes cogentin BID  Severe protein calorie malnutrition - albumin 2.6. Takes marinol   Urinary retention - stable on proscar  Dystonia - stable on pyridostigmine  GERD - stable on protonix, zofran and phenergan. Has seen GI in the past and gastric stimulator recommended several yrs ago. She has constipation and takes miralax  Past Medical History:  Diagnosis Date  . Depression   . Diabetes mellitus without complication ([place])   . Essential hypertension   . Gastroparesis   . GERD (gastroesophageal reflux disease)   . HLD (hyperlipidemia)     Past Surgical History:  Procedure Laterality Date  . COLONOSCOPY  09/27/2014   at [facility]  . ESOPHAGOGASTRODUODENOSCOPY  09/27/2014   at [facility], Dr Rolan Lipa. biospy neg for celiac, neg for H pylori.   . EYE SURGERY    . gailstones    . IR FLUORO GUIDE CV LINE RIGHT  02/01/2017  . IR FLUORO GUIDE CV LINE RIGHT  03/06/2017  . IR FLUORO GUIDE CV LINE RIGHT  03/25/2017  . IR GENERIC HISTORICAL  01/24/2016   IR FLUORO GUIDE CV LINE RIGHT 01/24/2016 Darrell K Allred, PA-C [facility]  . IR GENERIC HISTORICAL  01/24/2016   IR US GUIDE VASC ACCESS RIGHT 01/24/2016 Darrell K Allred, PA-C [facility]  . IR US GUIDE VASC ACCESS RIGHT  02/01/2017  . IR US GUIDE VASC ACCESS RIGHT  03/06/2017  . IR  US GUIDE VASC ACCESS RIGHT  03/25/2017  . POSTERIOR VITRECTOMY AND MEMBRANE PEEL-LEFT EYE  09/28/2002  . POSTERIOR VITRECTOMY AND MEMBRANE PEEL-RIGHT EYE  03/16/2002  . RETINAL DETACHMENT SURGERY      Patient Care Team: [name], MD as PCP - General (Family Medicine)  Social History   Social History  . Marital status: Single    Spouse name: N/A  . Number of children: N/A  . Years of education: N/A   Occupational History  . Not on file.   Social History Main Topics  . Smoking  status: Never Smoker  . Smokeless tobacco: Never Used  . Alcohol use No  . Drug use: No  . Sexual activity: Not Currently   Other Topics Concern  . Not on file   Social History Narrative  . No narrative on file     reports that she has never smoked. She has never used smokeless tobacco. She reports that she does not drink alcohol or use drugs.  Family History  Problem Relation Age of Onset  . Cystic fibrosis Mother   . Hypertension Father   . Diabetes Brother   . Hypertension Maternal Grandmother    Family Status  Relation Status  . Mother Deceased       CYSTIC FIBROSIS  . Father Alive  . Sister Alive  . Brother Alive  . Son Alive  . MGM Alive  . MGF Deceased  . PGM Deceased  . PGF Deceased    Immunization History  Administered Date(s) Administered  . Influenza Split 03/25/2012  . Influenza, Seasonal, Injecte, Preservative Fre 04/08/2014  . Influenza,inj,Quad PF,6+ Mos 04/09/2015, 04/07/2016, 04/03/2017  . Influenza-Unspecified 03/30/2015  . PPD Test 04/04/2017  . Pneumococcal-Unspecified 04/29/2014    Allergies  Allergen Reactions  . Anesthetics, Amide Nausea And Vomiting  . Penicillins Diarrhea and Nausea And Vomiting    Has patient had a PCN reaction causing immediate rash, facial/tongue/throat swelling, SOB or lightheadedness with hypotension: No Has patient had a PCN reaction causing severe rash involving mucus membranes or skin necrosis: No Has patient had a PCN reaction that required hospitalization No Has patient had a PCN reaction occurring within the last 10 years: Yes  If all of the above answers are "NO", then may proceed with Cephalosporin use.   . Buprenorphine Hcl Rash  . Encainide Nausea And Vomiting  . Metoclopramide Other (See Comments)    Dystonia, muscle rigidity.     Medications: Patient's Medications  New Prescriptions   No medications on file  Previous Medications   BENZTROPINE (COGENTIN) 1 MG TABLET    Take 1 tablet (1 mg  total) by mouth 2 (two) times daily.   DRONABINOL (MARINOL) 5 MG CAPSULE    Take 2 capsules by mouth two times daily   FINASTERIDE (PROSCAR) 5 MG TABLET    Take 1 tablet (5 mg total) by mouth daily.   FUROSEMIDE (LASIX) 40 MG TABLET    Take 40 mg by mouth daily as needed for fluid.    INSULIN DETEMIR (LEVEMIR) 100 UNIT/ML INJECTION    Inject 0.08 mLs (8 Units total) into the skin 2 (two) times daily.   ONDANSETRON (ZOFRAN-ODT) 8 MG DISINTEGRATING TABLET    Take 1 tablet (8 mg total) by mouth every 8 (eight) hours.   PANTOPRAZOLE (PROTONIX) 40 MG TABLET    Take 1 tablet (40 mg total) by mouth daily.   POLYETHYLENE GLYCOL (MIRALAX / GLYCOLAX) PACKET    Take 17 g by mouth daily.  PREGABALIN (LYRICA) 150 MG CAPSULE    Take 1 capsule (150 mg total) by mouth 2 (two) times daily.   PROMETHAZINE (PHENERGAN) 25 MG SUPPOSITORY    Place 1 suppository (25 mg total) rectally every 6 (six) hours as needed for nausea.   PYRIDOSTIGMINE BROMIDE PO    Give 30 mg by mouth daily before meals  Modified Medications   Modified Medication Previous Medication   CLONAZEPAM (KLONOPIN) 0.5 MG DISINTEGRATING TABLET clonazepam (KLONOPIN) 0.5 MG disintegrating tablet      Take 1 tablet (0.5 mg total) by mouth 2 (two) times daily.    Take 1 tablet (0.5 mg total) by mouth 2 (two) times daily.  Discontinued Medications   ERYTHROMYCIN (EES) 400 MG/5ML SUSPENSION    Take 5 mLs (400 mg total) by mouth every 8 (eight) hours.    Review of Systems  Unable to perform ROS: Psychiatric disorder (cognitive impairment)    Vitals:   04/15/17 1116  BP: 137/71  Pulse: 80  Resp: 16  Temp: 98 F (36.7 C)  SpO2: 98%  Weight: 138 lb 9.6 oz (62.9 kg)  Height: 5' 2"  (1.575 m)   Body mass index is 25.35 kg/m.  Physical Exam  Constitutional: She appears well-developed.  Sitting on edge of bed in NAD, looks uncomfortable, frail appearing  HENT:  Mouth/Throat: Oropharynx is clear and moist. No oropharyngeal exudate.  MMM; no oral  thrush  Eyes: Pupils are equal, round, and reactive to light. No scleral icterus.  Neck: Neck supple. Carotid bruit is not present. No tracheal deviation present. No thyromegaly present.  Cardiovascular: Normal rate, regular rhythm, normal heart sounds and intact distal pulses.  Exam reveals no gallop and no friction rub.   No murmur heard. No LE edema b/l. no calf TTP.   Pulmonary/Chest: Effort normal and breath sounds normal. No stridor. No respiratory distress. She has no wheezes. She has no rales.  Abdominal: Soft. Normal appearance and bowel sounds are normal. She exhibits distension (with fullness). She exhibits no mass. There is no hepatomegaly. There is tenderness. There is no rigidity, no rebound and no guarding. No hernia.  Musculoskeletal: She exhibits edema.  Lymphadenopathy:    She has no cervical adenopathy.  Neurological: She is alert.  Skin: Skin is warm and dry. No rash noted.  Psychiatric: Thought content normal. Her mood appears anxious. Her speech is slurred. She is slowed. Cognition and memory are impaired.     Labs reviewed: Nursing Home on 04/04/2017  Component Date Value Ref Range Status  . Hemoglobin 04/09/2017 10.6* 12.0 - 16.0 Final  . HCT 04/09/2017 32* 36 - 46 Final  . Neutrophils Absolute 04/09/2017 3   Final  . Platelets 04/09/2017 277  150 - 399 Final  . WBC 04/09/2017 5.7   Final  . Glucose 04/09/2017 302   Final  . BUN 04/09/2017 15  4 - 21 Final  . Creatinine 04/09/2017 1.3* 0.5 - 1.1 Final  . Potassium 04/09/2017 5.5* 3.4 - 5.3 Final  . Sodium 04/09/2017 134* 137 - 147 Final  . Alkaline Phosphatase 04/09/2017 90  25 - 125 Final  . ALT 04/09/2017 18  7 - 35 Final  . AST 04/09/2017 24  13 - 35 Final  . Bilirubin, Total 04/09/2017 0.2   Final  Admission on 03/24/2017, Discharged on 04/03/2017  No results displayed because visit has over 200 results.    Admission on 03/15/2017, Discharged on 03/20/2017  No results displayed because visit has over  200 results.  Admission on 03/05/2017, Discharged on 03/09/2017  No results displayed because visit has over 200 results.    Admission on 02/05/2017, Discharged on 02/06/2017  Component Date Value Ref Range Status  . Sodium 02/05/2017 137  135 - 145 mmol/L Final  . Potassium 02/05/2017 3.3* 3.5 - 5.1 mmol/L Final  . Chloride 02/05/2017 98* 101 - 111 mmol/L Final  . CO2 02/05/2017 19* 22 - 32 mmol/L Final  . Glucose, Bld 02/05/2017 350* 65 - 99 mg/dL Final  . BUN 02/05/2017 14  6 - 20 mg/dL Final  . Creatinine, Ser 02/05/2017 1.07* 0.44 - 1.00 mg/dL Final  . Calcium 02/05/2017 9.9  8.9 - 10.3 mg/dL Final  . Total Protein 02/05/2017 8.6* 6.5 - 8.1 g/dL Final  . Albumin 02/05/2017 4.4  3.5 - 5.0 g/dL Final  . AST 02/05/2017 32  15 - 41 U/L Final  . ALT 02/05/2017 22  14 - 54 U/L Final  . Alkaline Phosphatase 02/05/2017 96  38 - 126 U/L Final  . Total Bilirubin 02/05/2017 1.0  0.3 - 1.2 mg/dL Final  . GFR calc non Af Amer 02/05/2017 60* >60 mL/min Final  . GFR calc Af Amer 02/05/2017 >60  >60 mL/min Final   Comment: (NOTE) The eGFR has been calculated using the CKD EPI equation. This calculation has not been validated in all clinical situations. eGFR's persistently <60 mL/min signify possible Chronic Kidney Disease.   . Anion gap 02/05/2017 20* 5 - 15 Final  . Lipase 02/05/2017 27  11 - 51 U/L Final  . WBC 02/05/2017 8.5  4.0 - 10.5 K/uL Final  . RBC 02/05/2017 4.66  3.87 - 5.11 MIL/uL Final  . Hemoglobin 02/05/2017 10.5* 12.0 - 15.0 g/dL Final  . HCT 02/05/2017 33.4* 36.0 - 46.0 % Final  . MCV 02/05/2017 71.7* 78.0 - 100.0 fL Final  . MCH 02/05/2017 22.5* 26.0 - 34.0 pg Final  . MCHC 02/05/2017 31.4  30.0 - 36.0 g/dL Final  . RDW 02/05/2017 16.9* 11.5 - 15.5 % Final  . Platelets 02/05/2017 442* 150 - 400 K/uL Final  . Neutrophils Relative % 02/05/2017 89  % Final  . Neutro Abs 02/05/2017 7.5  1.7 - 7.7 K/uL Final  . Lymphocytes Relative 02/05/2017 7  % Final  . Lymphs Abs  02/05/2017 0.6* 0.7 - 4.0 K/uL Final  . Monocytes Relative 02/05/2017 4  % Final  . Monocytes Absolute 02/05/2017 0.3  0.1 - 1.0 K/uL Final  . Eosinophils Relative 02/05/2017 0  % Final  . Eosinophils Absolute 02/05/2017 0.0  0.0 - 0.7 K/uL Final  . Basophils Relative 02/05/2017 0  % Final  . Basophils Absolute 02/05/2017 0.0  0.0 - 0.1 K/uL Final  . O2 Content 02/05/2017 PENDING  L/min Incomplete  . pH, Ven 02/05/2017 7.471* 7.250 - 7.430 Final  . pCO2, Ven 02/05/2017 28.3* 44.0 - 60.0 mmHg Final  . pO2, Ven 02/05/2017 below reportable range  32.0 - 45.0 mmHg Final   rbv jon knapp md by angie dunlap rrt rcp on 02/05/17 at 0852  . Bicarbonate 02/05/2017 20.4  20.0 - 28.0 mmol/L Final  . Acid-base deficit 02/05/2017 2.0  0.0 - 2.0 mmol/L Final  . O2 Saturation 02/05/2017 39.4  % Final  . Patient temperature 02/05/2017 98.6   Final  . Collection site 02/05/2017 VEIN   Final  . Drawn by 02/05/2017 DRAWN BY RN   Final  . Sample type 02/05/2017 VENOUS   Final  . Glucose-Capillary 02/05/2017 315* 65 - 99  mg/dL Final  . Comment 1 02/05/2017 Notify RN   Final  . Comment 2 02/05/2017 Document in Chart   Final  . Glucose-Capillary 02/05/2017 300* 65 - 99 mg/dL Final  . Sodium 02/06/2017 134* 135 - 145 mmol/L Final  . Potassium 02/06/2017 5.0  3.5 - 5.1 mmol/L Final   Comment: DELTA CHECK NOTED REPEATED TO VERIFY NO VISIBLE HEMOLYSIS   . Chloride 02/06/2017 107  101 - 111 mmol/L Final  . CO2 02/06/2017 19* 22 - 32 mmol/L Final  . Glucose, Bld 02/06/2017 187* 65 - 99 mg/dL Final  . BUN 02/06/2017 13  6 - 20 mg/dL Final  . Creatinine, Ser 02/06/2017 0.92  0.44 - 1.00 mg/dL Final  . Calcium 02/06/2017 8.3* 8.9 - 10.3 mg/dL Final  . GFR calc non Af Amer 02/06/2017 >60  >60 mL/min Final  . GFR calc Af Amer 02/06/2017 >60  >60 mL/min Final   Comment: (NOTE) The eGFR has been calculated using the CKD EPI equation. This calculation has not been validated in all clinical situations. eGFR's  persistently <60 mL/min signify possible Chronic Kidney Disease.   . Anion gap 02/06/2017 8  5 - 15 Final  . WBC 02/06/2017 6.6  4.0 - 10.5 K/uL Final  . RBC 02/06/2017 3.90  3.87 - 5.11 MIL/uL Final  . Hemoglobin 02/06/2017 8.9* 12.0 - 15.0 g/dL Final  . HCT 02/06/2017 28.3* 36.0 - 46.0 % Final  . MCV 02/06/2017 72.6* 78.0 - 100.0 fL Final  . MCH 02/06/2017 22.8* 26.0 - 34.0 pg Final  . MCHC 02/06/2017 31.4  30.0 - 36.0 g/dL Final  . RDW 02/06/2017 17.9* 11.5 - 15.5 % Final  . Platelets 02/06/2017 384  150 - 400 K/uL Final  . Glucose-Capillary 02/05/2017 240* 65 - 99 mg/dL Final  . Glucose-Capillary 02/05/2017 101* 65 - 99 mg/dL Final  . Glucose-Capillary 02/06/2017 140* 65 - 99 mg/dL Final  . Glucose-Capillary 02/06/2017 201* 65 - 99 mg/dL Final  Admission on 01/31/2017, Discharged on 02/03/2017  Component Date Value Ref Range Status  . Glucose-Capillary 01/31/2017 522* 65 - 99 mg/dL Final  . WBC 01/31/2017 18.4* 4.0 - 10.5 K/uL Final  . RBC 01/31/2017 4.24  3.87 - 5.11 MIL/uL Final  . Hemoglobin 01/31/2017 9.3* 12.0 - 15.0 g/dL Final  . HCT 01/31/2017 30.7* 36.0 - 46.0 % Final  . MCV 01/31/2017 72.4* 78.0 - 100.0 fL Final  . MCH 01/31/2017 21.9* 26.0 - 34.0 pg Final  . MCHC 01/31/2017 30.3  30.0 - 36.0 g/dL Final  . RDW 01/31/2017 16.8* 11.5 - 15.5 % Final  . Platelets 01/31/2017 412* 150 - 400 K/uL Final  . Neutrophils Relative % 01/31/2017 92  % Final  . Lymphocytes Relative 01/31/2017 3  % Final  . Monocytes Relative 01/31/2017 5  % Final  . Eosinophils Relative 01/31/2017 0  % Final  . Basophils Relative 01/31/2017 0  % Final  . Neutro Abs 01/31/2017 16.9* 1.7 - 7.7 K/uL Final  . Lymphs Abs 01/31/2017 0.6* 0.7 - 4.0 K/uL Final  . Monocytes Absolute 01/31/2017 0.9  0.1 - 1.0 K/uL Final  . Eosinophils Absolute 01/31/2017 0.0  0.0 - 0.7 K/uL Final  . Basophils Absolute 01/31/2017 0.0  0.0 - 0.1 K/uL Final  . Smear Review 01/31/2017 MORPHOLOGY UNREMARKABLE   Final  . Sodium  01/31/2017 138  135 - 145 mmol/L Final  . Potassium 01/31/2017 4.6  3.5 - 5.1 mmol/L Final  . Chloride 01/31/2017 108  101 - 111 mmol/L  Final  . CO2 01/31/2017 10* 22 - 32 mmol/L Final  . Glucose, Bld 01/31/2017 579* 65 - 99 mg/dL Final   Comment: CRITICAL RESULT CALLED TO, READ BACK BY AND VERIFIED WITH: Q.WATERS RN AT 1424 ON 01/31/17 BY S.VANHOORNE MLS   . BUN 01/31/2017 26* 6 - 20 mg/dL Final  . Creatinine, Ser 01/31/2017 1.40* 0.44 - 1.00 mg/dL Final  . Calcium 01/31/2017 9.0  8.9 - 10.3 mg/dL Final  . Total Protein 01/31/2017 7.7  6.5 - 8.1 g/dL Final  . Albumin 01/31/2017 4.0  3.5 - 5.0 g/dL Final  . AST 01/31/2017 18  15 - 41 U/L Final  . ALT 01/31/2017 12* 14 - 54 U/L Final  . Alkaline Phosphatase 01/31/2017 80  38 - 126 U/L Final  . Total Bilirubin 01/31/2017 1.7* 0.3 - 1.2 mg/dL Final  . GFR calc non Af Amer 01/31/2017 43* >60 mL/min Final  . GFR calc Af Amer 01/31/2017 50* >60 mL/min Final   Comment: (NOTE) The eGFR has been calculated using the CKD EPI equation. This calculation has not been validated in all clinical situations. eGFR's persistently <60 mL/min signify possible Chronic Kidney Disease.   . Anion gap 01/31/2017 20* 5 - 15 Final  . Color, Urine 01/31/2017 STRAW* YELLOW Final  . APPearance 01/31/2017 CLEAR  CLEAR Final  . Specific Gravity, Urine 01/31/2017 1.022  1.005 - 1.030 Final  . pH 01/31/2017 5.0  5.0 - 8.0 Final  . Glucose, UA 01/31/2017 >=500* NEGATIVE mg/dL Final  . Hgb urine dipstick 01/31/2017 LARGE* NEGATIVE Final  . Bilirubin Urine 01/31/2017 NEGATIVE  NEGATIVE Final  . Ketones, ur 01/31/2017 80* NEGATIVE mg/dL Final  . Protein, ur 01/31/2017 100* NEGATIVE mg/dL Final  . Nitrite 01/31/2017 NEGATIVE  NEGATIVE Final  . Leukocytes, UA 01/31/2017 NEGATIVE  NEGATIVE Final  . RBC / HPF 01/31/2017 TOO NUMEROUS TO COUNT  0 - 5 RBC/hpf Final  . WBC, UA 01/31/2017 0-5  0 - 5 WBC/hpf Final  . Bacteria, UA 01/31/2017 NONE SEEN  NONE SEEN Final  .  Squamous Epithelial / LPF 01/31/2017 0-5* NONE SEEN Final  . Mucus 01/31/2017 PRESENT   Final  . Lipase 01/31/2017 20  11 - 51 U/L Final  . I-stat hCG, quantitative 01/31/2017 <5.0  <5 mIU/mL Final  . Comment 3 01/31/2017          Final   Comment:   GEST. AGE      CONC.  (mIU/mL)   <=1 WEEK        5 - 50     2 WEEKS       50 - 500     3 WEEKS       100 - 10,000     4 WEEKS     1,000 - 30,000        FEMALE AND NON-PREGNANT FEMALE:     LESS THAN 5 mIU/mL   . Troponin I 01/31/2017 <0.03  <0.03 ng/mL Final  . Glucose-Capillary 01/31/2017 520* 65 - 99 mg/dL Final  . Glucose-Capillary 01/31/2017 476* 65 - 99 mg/dL Final  . Glucose-Capillary 01/31/2017 421* 65 - 99 mg/dL Final  . Glucose-Capillary 01/31/2017 359* 65 - 99 mg/dL Final  . Glucose-Capillary 01/31/2017 280* 65 - 99 mg/dL Final  . Glucose-Capillary 01/31/2017 259* 65 - 99 mg/dL Final  . Comment 1 01/31/2017 Notify RN   Final  . Comment 2 01/31/2017 Document in Chart   Final  . Sodium 02/01/2017 149* 135 - 145 mmol/L Final  DELTA CHECK NOTED  . Potassium 02/01/2017 3.8  3.5 - 5.1 mmol/L Final   DELTA CHECK NOTED  . Chloride 02/01/2017 120* 101 - 111 mmol/L Final  . CO2 02/01/2017 18* 22 - 32 mmol/L Final  . Glucose, Bld 02/01/2017 134* 65 - 99 mg/dL Final  . BUN 02/01/2017 24* 6 - 20 mg/dL Final  . Creatinine, Ser 02/01/2017 1.27* 0.44 - 1.00 mg/dL Final  . Calcium 02/01/2017 9.0  8.9 - 10.3 mg/dL Final  . GFR calc non Af Amer 02/01/2017 49* >60 mL/min Final  . GFR calc Af Amer 02/01/2017 56* >60 mL/min Final   Comment: (NOTE) The eGFR has been calculated using the CKD EPI equation. This calculation has not been validated in all clinical situations. eGFR's persistently <60 mL/min signify possible Chronic Kidney Disease.   . Anion gap 02/01/2017 11  5 - 15 Final  . MRSA by PCR 01/31/2017 NEGATIVE  NEGATIVE Final   Comment:        The GeneXpert MRSA Assay (FDA approved for NASAL specimens only), is one component of  a comprehensive MRSA colonization surveillance program. It is not intended to diagnose MRSA infection nor to guide or monitor treatment for MRSA infections.   . Glucose-Capillary 01/31/2017 218* 65 - 99 mg/dL Final  . Comment 1 01/31/2017 Notify RN   Final  . Comment 2 01/31/2017 Document in Chart   Final  . Glucose-Capillary 01/31/2017 181* 65 - 99 mg/dL Final  . Glucose-Capillary 02/01/2017 103* 65 - 99 mg/dL Final  . Glucose-Capillary 02/01/2017 154* 65 - 99 mg/dL Final  . Glucose-Capillary 02/01/2017 128* 65 - 99 mg/dL Final  . Glucose-Capillary 02/01/2017 106* 65 - 99 mg/dL Final  . Glucose-Capillary 02/01/2017 103* 65 - 99 mg/dL Final  . Glucose-Capillary 02/01/2017 108* 65 - 99 mg/dL Final  . Glucose-Capillary 02/01/2017 119* 65 - 99 mg/dL Final  . Glucose-Capillary 02/01/2017 136* 65 - 99 mg/dL Final  . Glucose-Capillary 02/01/2017 175* 65 - 99 mg/dL Final  . Glucose-Capillary 02/01/2017 155* 65 - 99 mg/dL Final  . Glucose-Capillary 02/01/2017 159* 65 - 99 mg/dL Final  . Sodium 02/01/2017 141  135 - 145 mmol/L Final   Comment: DELTA CHECK NOTED REPEATED TO VERIFY   . Potassium 02/01/2017 3.9  3.5 - 5.1 mmol/L Final  . Chloride 02/01/2017 115* 101 - 111 mmol/L Final  . CO2 02/01/2017 17* 22 - 32 mmol/L Final  . Glucose, Bld 02/01/2017 171* 65 - 99 mg/dL Final  . BUN 02/01/2017 20  6 - 20 mg/dL Final  . Creatinine, Ser 02/01/2017 1.08* 0.44 - 1.00 mg/dL Final  . Calcium 02/01/2017 8.5* 8.9 - 10.3 mg/dL Final  . GFR calc non Af Amer 02/01/2017 59* >60 mL/min Final  . GFR calc Af Amer 02/01/2017 >60  >60 mL/min Final   Comment: (NOTE) The eGFR has been calculated using the CKD EPI equation. This calculation has not been validated in all clinical situations. eGFR's persistently <60 mL/min signify possible Chronic Kidney Disease.   . Anion gap 02/01/2017 9  5 - 15 Final  . WBC 02/01/2017 19.6* 4.0 - 10.5 K/uL Final  . RBC 02/01/2017 4.19  3.87 - 5.11 MIL/uL Final  .  Hemoglobin 02/01/2017 9.4* 12.0 - 15.0 g/dL Final  . HCT 02/01/2017 31.0* 36.0 - 46.0 % Final  . MCV 02/01/2017 74.0* 78.0 - 100.0 fL Final  . MCH 02/01/2017 22.4* 26.0 - 34.0 pg Final  . MCHC 02/01/2017 30.3  30.0 - 36.0 g/dL Final  . RDW  02/01/2017 17.4* 11.5 - 15.5 % Final  . Platelets 02/01/2017 414* 150 - 400 K/uL Final  . Glucose-Capillary 02/01/2017 163* 65 - 99 mg/dL Final  . Sodium 02/02/2017 139  135 - 145 mmol/L Final  . Potassium 02/02/2017 4.0  3.5 - 5.1 mmol/L Final  . Chloride 02/02/2017 113* 101 - 111 mmol/L Final  . CO2 02/02/2017 18* 22 - 32 mmol/L Final  . Glucose, Bld 02/02/2017 157* 65 - 99 mg/dL Final  . BUN 02/02/2017 17  6 - 20 mg/dL Final  . Creatinine, Ser 02/02/2017 1.07* 0.44 - 1.00 mg/dL Final  . Calcium 02/02/2017 8.2* 8.9 - 10.3 mg/dL Final  . GFR calc non Af Amer 02/02/2017 60* >60 mL/min Final  . GFR calc Af Amer 02/02/2017 >60  >60 mL/min Final   Comment: (NOTE) The eGFR has been calculated using the CKD EPI equation. This calculation has not been validated in all clinical situations. eGFR's persistently <60 mL/min signify possible Chronic Kidney Disease.   . Anion gap 02/02/2017 8  5 - 15 Final  . Glucose-Capillary 02/01/2017 150* 65 - 99 mg/dL Final  . Glucose-Capillary 02/01/2017 200* 65 - 99 mg/dL Final  . Comment 1 02/01/2017 Notify RN   Final  . Glucose-Capillary 02/02/2017 61* 65 - 99 mg/dL Final  . WBC 02/02/2017 9.1  4.0 - 10.5 K/uL Final  . RBC 02/02/2017 3.50* 3.87 - 5.11 MIL/uL Final  . Hemoglobin 02/02/2017 8.0* 12.0 - 15.0 g/dL Final  . HCT 02/02/2017 25.7* 36.0 - 46.0 % Final  . MCV 02/02/2017 73.4* 78.0 - 100.0 fL Final  . MCH 02/02/2017 22.9* 26.0 - 34.0 pg Final  . MCHC 02/02/2017 31.1  30.0 - 36.0 g/dL Final  . RDW 02/02/2017 17.8* 11.5 - 15.5 % Final  . Platelets 02/02/2017 325  150 - 400 K/uL Final  . Neutrophils Relative % 02/02/2017 68  % Final  . Neutro Abs 02/02/2017 6.1  1.7 - 7.7 K/uL Final  . Lymphocytes Relative  02/02/2017 24  % Final  . Lymphs Abs 02/02/2017 2.2  0.7 - 4.0 K/uL Final  . Monocytes Relative 02/02/2017 6  % Final  . Monocytes Absolute 02/02/2017 0.6  0.1 - 1.0 K/uL Final  . Eosinophils Relative 02/02/2017 2  % Final  . Eosinophils Absolute 02/02/2017 0.2  0.0 - 0.7 K/uL Final  . Basophils Relative 02/02/2017 0  % Final  . Basophils Absolute 02/02/2017 0.0  0.0 - 0.1 K/uL Final  . Vitamin B-12 02/02/2017 252  180 - 914 pg/mL Final   Comment: (NOTE) This assay is not validated for testing neonatal or myeloproliferative syndrome specimens for Vitamin B12 levels. Performed at [facility], [place] [address], [place], [place] [address]   . Folate 02/02/2017 16.5  >5.9 ng/mL Final   Performed at [facility], [place] [address] [place], [place] [address]  . Iron 02/02/2017 91  28 - 170 ug/dL Final  . TIBC 02/02/2017 343  250 - 450 ug/dL Final  . Saturation Ratios 02/02/2017 27  10.4 - 31.8 % Final  . UIBC 02/02/2017 252  ug/dL Final   Performed at [facility], [place] [address], [place], [place] [address]  . Ferritin 02/02/2017 8* 11 - 307 ng/mL Final   Performed at [facility], [place] [address] [place], [place] [address]  . Retic Ct Pct 02/02/2017 0.9  0.4 - 3.1 % Final  . RBC. 02/02/2017 3.50* 3.87 - 5.11 MIL/uL Final  . Retic Count,  Absolute 02/02/2017 31.5  19.0 - 186.0 K/uL Final  . Glucose-Capillary 02/02/2017 175* 65 - 99 mg/dL Final  . Glucose-Capillary 02/02/2017 123* 65 - 99 mg/dL Final  . Glucose-Capillary 02/02/2017 141* 65 - 99 mg/dL Final  . Sodium 02/03/2017 139  135 - 145 mmol/L Final  . Potassium 02/03/2017 4.2  3.5 - 5.1 mmol/L Final  . Chloride 02/03/2017 109  101 - 111 mmol/L Final  . CO2 02/03/2017 22  22 - 32 mmol/L Final  . Glucose, Bld 02/03/2017 189* 65 - 99 mg/dL Final  . BUN 02/03/2017 12  6 - 20 mg/dL Final  . Creatinine, Ser 02/03/2017 0.95  0.44 - 1.00 mg/dL Final  . Calcium 02/03/2017 8.3* 8.9 - 10.3 mg/dL Final  . GFR calc non Af  Amer 02/03/2017 >60  >60 mL/min Final  . GFR calc Af Amer 02/03/2017 >60  >60 mL/min Final   Comment: (NOTE) The eGFR has been calculated using the CKD EPI equation. This calculation has not been validated in all clinical situations. eGFR's persistently <60 mL/min signify possible Chronic Kidney Disease.   . Anion gap 02/03/2017 8  5 - 15 Final  . Glucose-Capillary 02/02/2017 135* 65 - 99 mg/dL Final  . Glucose-Capillary 02/03/2017 159* 65 - 99 mg/dL Final  . Hemoglobin 02/03/2017 8.7* 12.0 - 15.0 g/dL Final  . HCT 02/03/2017 27.9* 36.0 - 46.0 % Final  . Glucose-Capillary 02/03/2017 303* 65 - 99 mg/dL Final  Office Visit on 01/30/2017  Component Date Value Ref Range Status  . POC Glucose 01/30/2017 229* 70 - 99 mg/dl Final  Office Visit on 01/17/2017  Component Date Value Ref Range Status  . POC Glucose 01/17/2017 175* 70 - 99 mg/dl Final  . Hemoglobin A1C 01/17/2017 10.1   Final  . Color, UA 01/17/2017 dark yellow   Final  . Clarity, UA 01/17/2017 clear   Final  . Glucose, UA 01/17/2017 500   Final  . Bilirubin, UA 01/17/2017 neg   Final  . Ketones, UA 01/17/2017 neg   Final  . Spec Grav, UA 01/17/2017 1.025  1.010 - 1.025 Final  . Blood, UA 01/17/2017 trace   Final  . pH, UA 01/17/2017 5.5  5.0 - 8.0 Final  . Protein, UA 01/17/2017 100   Final  . Urobilinogen, UA 01/17/2017 0.2  0.2 or 1.0 E.U./dL Final  . Nitrite, UA 01/17/2017 neg   Final  . Leukocytes, UA 01/17/2017 Negative  Negative Final  . Urine Culture, Routine 01/17/2017 Final report*  Final  . Organism ID, Bacteria 01/17/2017 Comment*  Final   Comment: Escherichia coli, identified by an automated biochemical system. Greater than 100,000 colony forming units per mL Cefazolin <=4 ug/mL Cefazolin with an MIC <=16 predicts susceptibility to the oral agents cefaclor, cefdinir, cefpodoxime, cefprozil, cefuroxime, cephalexin, and loracarbef when used for therapy of uncomplicated urinary tract infections due to E. coli,  Klebsiella pneumoniae, and Proteus mirabilis.   Marland Kitchen Antimicrobial Susceptibility 01/17/2017 Comment   Final   Comment:       ** S = Susceptible; I = Intermediate; R = Resistant **                    P = Positive; N = Negative             MICS are expressed in micrograms per mL    Antibiotic                 RSLT#1    RSLT#2  RSLT#3    RSLT#4 Amoxicillin/Clavulanic Acid    I Ampicillin                     R Cefepime                       S Ceftriaxone                    S Cefuroxime                     S Ciprofloxacin                  R Ertapenem                      S Gentamicin                     R Imipenem                       S Levofloxacin                   R Meropenem                      S Nitrofurantoin                 S Piperacillin/Tazobactam        S Tetracycline                   R Tobramycin                     I Trimethoprim/Sulfa             S     Ir Fluoro Guide Cv Line Right  Result Date: 03/25/2017 INDICATION: Diabetes, DKA, access requested for IV therapy and hydration. Poor venous access. EXAM: ULTRASOUND AND FLUOROSCOPIC GUIDED PICC LINE INSERTION MEDICATIONS: 1% lidocaine CONTRAST:  None FLUOROSCOPY TIME:  Thirty seconds (5 mGy) COMPLICATIONS: None immediate. TECHNIQUE: The procedure, risks, benefits, and alternatives were explained to the patient and informed written consent was obtained. A timeout was performed prior to the initiation of the procedure. The right upper extremity was prepped with chlorhexidine in a sterile fashion, and a sterile drape was applied covering the operative field. Maximum barrier sterile technique with sterile gowns and gloves were used for the procedure. A timeout was performed prior to the initiation of the procedure. Local anesthesia was provided with 1% lidocaine. Under direct ultrasound guidance, the right brachial vein was accessed with a micropuncture kit after the overlying soft tissues were anesthetized with 1% lidocaine. An  ultrasound image was saved for documentation purposes. A guidewire was advanced to the level of the superior caval-atrial junction for measurement purposes and the PICC line was cut to length. A peel-away sheath was placed and a 34 cm, 5 [place], dual lumen was inserted to level of the superior caval-atrial junction. A post procedure spot fluoroscopic was obtained. The catheter easily aspirated and flushed and was sutured in place. A dressing was placed. The patient tolerated the procedure well without immediate post procedural complication. FINDINGS: After catheter placement, the tip lies within the superior cavoatrial junction. The catheter aspirates and flushes normally and is ready for immediate use. IMPRESSION: Successful ultrasound and fluoroscopic guided placement of a right brachial vein approach, 34 cm, 5 [place],  dual lumen PICC with tip at the superior caval-atrial junction. The PICC line is ready for immediate use. Read by: Saverio Danker, PA-C Electronically Signed   By: Marybelle Killings M.D.   On: 03/25/2017 15:44   Ir US Guide Vasc Access Right  Result Date: 03/25/2017 INDICATION: Diabetes, DKA, access requested for IV therapy and hydration. Poor venous access. EXAM: ULTRASOUND AND FLUOROSCOPIC GUIDED PICC LINE INSERTION MEDICATIONS: 1% lidocaine CONTRAST:  None FLUOROSCOPY TIME:  Thirty seconds (5 mGy) COMPLICATIONS: None immediate. TECHNIQUE: The procedure, risks, benefits, and alternatives were explained to the patient and informed written consent was obtained. A timeout was performed prior to the initiation of the procedure. The right upper extremity was prepped with chlorhexidine in a sterile fashion, and a sterile drape was applied covering the operative field. Maximum barrier sterile technique with sterile gowns and gloves were used for the procedure. A timeout was performed prior to the initiation of the procedure. Local anesthesia was provided with 1% lidocaine. Under direct ultrasound guidance,  the right brachial vein was accessed with a micropuncture kit after the overlying soft tissues were anesthetized with 1% lidocaine. An ultrasound image was saved for documentation purposes. A guidewire was advanced to the level of the superior caval-atrial junction for measurement purposes and the PICC line was cut to length. A peel-away sheath was placed and a 34 cm, 5 [place], dual lumen was inserted to level of the superior caval-atrial junction. A post procedure spot fluoroscopic was obtained. The catheter easily aspirated and flushed and was sutured in place. A dressing was placed. The patient tolerated the procedure well without immediate post procedural complication. FINDINGS: After catheter placement, the tip lies within the superior cavoatrial junction. The catheter aspirates and flushes normally and is ready for immediate use. IMPRESSION: Successful ultrasound and fluoroscopic guided placement of a right brachial vein approach, 34 cm, 5 [place], dual lumen PICC with tip at the superior caval-atrial junction. The PICC line is ready for immediate use. Read by: Saverio Danker, PA-C Electronically Signed   By: Marybelle Killings M.D.   On: 03/25/2017 15:44   Dg Chest Port 1 View  Result Date: 03/25/2017 CLINICAL DATA:  Sepsis, diabetes. EXAM: PORTABLE CHEST 1 VIEW COMPARISON:  Portable chest x-ray of March 07, 2017 FINDINGS: The lungs are adequately inflated and clear. The heart and mediastinal structures are normal. There is no pleural effusion. The bony thorax is unremarkable. The right-sided PICC line has been removed. IMPRESSION: There is no active cardiopulmonary disease. Electronically Signed   By: David  Martinique M.D.   On: 03/25/2017 07:18   Dg Abd Portable 1v  Result Date: 03/30/2017 CLINICAL DATA:  Encounter for feeding tube placement. EXAM: PORTABLE ABDOMEN - 1 VIEW COMPARISON:  None. FINDINGS: Enteric feeding tube passes well below the diaphragm. Metallic tip projects in the right upper quadrant,  likely within the first portion of the duodenum. Normal bowel gas pattern.  Lung bases are clear. IMPRESSION: Enteric feeding tube tip projects in the region of the first portion of the duodenum. Electronically Signed   By: Lajean Manes M.D.   On: 03/30/2017 15:43   Dg Abd Portable 1v  Result Date: 03/25/2017 CLINICAL DATA:  NG tube placement. EXAM: PORTABLE ABDOMEN - 1 VIEW COMPARISON:  02/05/2017 FINDINGS: An enteric tube overlies the stomach with tip and side hole well beyond the GE junction. A moderate amount of stool is again seen throughout the colon. No dilated loops of bowel are seen to suggest obstruction. Cholecystectomy clips are noted.  No acute osseous abnormality is seen. IMPRESSION: Enteric tube in the stomach. Electronically Signed   By: Logan Bores M.D.   On: 03/25/2017 10:27     Assessment/Plan   ICD-10-CM   1. Constipation, unspecified constipation type K59.00   2. Diabetic gastroparesis ([place]) E11.43    K31.84   3. Dystonia G24.9   4. Diabetic neuropathy, type I diabetes mellitus ([place]) E10.40   5. Severe protein-calorie malnutrition ([place]) E43   6. Essential hypertension I10   7. Anxiety and depression F41.9    F32.9   8. Microcytic anemia D50.9   9. Electrolyte disturbance E87.8     Give dulcolax supp 24m PR x 1 now and use daily prn constipation  CBC and BMP pending  Check abdominal flat and upright xray  Cont other meds as ordered. She has 14 day prn ativan for panic attacks  Nutritional supplements as ordered  F/u with specialists as indicated  Psych services to follow  PT/Ot/ST as ordered  GOAL: short term rehab and d/c home when medically appropriate. Communicated with pt and nursing.  Will follow  Akeya Ryther S. CPerlie Gold PHenry [facility] Centerand Adult Medicine [address]GG[address]  [phone] (Monday-Friday 8 AM - 5 PM) [phone] 5 PM and follow prompts

## 2017-04-15 NOTE — Telephone Encounter (Signed)
RX faxed to [facility] @ [phone], phone number [phone] 

## 2017-04-16 ENCOUNTER — Other Ambulatory Visit: Payer: Self-pay

## 2017-04-16 MED ORDER — DRONABINOL 10 MG PO CAPS: 10.0000 mg | ORAL_CAPSULE | Freq: Two times a day (BID) | ORAL | 0 refills | Status: DC

## 2017-04-16 NOTE — Telephone Encounter (Signed)
RX faxed to [facility] @ [phone], phone number [phone] 

## 2017-04-17 DIAGNOSIS — E43 Unspecified severe protein-calorie malnutrition: Secondary | ICD-10-CM | POA: Insufficient documentation

## 2017-04-22 ENCOUNTER — Other Ambulatory Visit: Payer: Self-pay

## 2017-04-22 MED ORDER — DRONABINOL 10 MG PO CAPS: 10.0000 mg | ORAL_CAPSULE | Freq: Two times a day (BID) | ORAL | 0 refills | Status: DC

## 2017-04-22 MED ORDER — PREGABALIN 150 MG PO CAPS: 150.0000 mg | ORAL_CAPSULE | Freq: Two times a day (BID) | ORAL | 0 refills | Status: DC

## 2017-04-22 NOTE — Telephone Encounter (Signed)
RX faxed to [facility] @ [phone], phone number [phone] 

## 2017-04-26 ENCOUNTER — Non-Acute Institutional Stay (SKILLED_NURSING_FACILITY): Payer: Medicaid Other | Admitting: Adult Health

## 2017-04-26 ENCOUNTER — Encounter: Payer: Self-pay | Admitting: Adult Health

## 2017-04-26 DIAGNOSIS — G2401 Drug induced subacute dyskinesia: Secondary | ICD-10-CM

## 2017-04-26 DIAGNOSIS — E1040 Type 1 diabetes mellitus with diabetic neuropathy, unspecified: Secondary | ICD-10-CM | POA: Diagnosis not present

## 2017-04-26 DIAGNOSIS — G2402 Drug induced acute dystonia: Secondary | ICD-10-CM | POA: Diagnosis not present

## 2017-04-26 NOTE — Progress Notes (Signed)
Location:   [place] Room Number: 127 A Place of Service:  SNF (31)   CODE STATUS: Full code  Allergies  Allergen Reactions  . Anesthetics, Amide Nausea And Vomiting  . Penicillins Diarrhea and Nausea And Vomiting    Has patient had a PCN reaction causing immediate rash, facial/tongue/throat swelling, SOB or lightheadedness with hypotension: No Has patient had a PCN reaction causing severe rash involving mucus membranes or skin necrosis: No Has patient had a PCN reaction that required hospitalization No Has patient had a PCN reaction occurring within the last 10 years: Yes  If all of the above answers are "NO", then may proceed with Cephalosporin use.   . Buprenorphine Hcl Rash  . Encainide Nausea And Vomiting  . Metoclopramide Other (See Comments)    Dystonia, muscle rigidity.     Chief Complaint  Patient presents with  . Acute Visit    Care Plan Meeting    HPI:  We have come together with family and care plan team for her care plan meeting. She is wanting to go home with her family. Her family does understand that she will need 24 hours supervision. They are making plans for her care to take place. We discussed any dme she will need. We did discuss her medication regimen. We discussed what to expect upon discharge to home; her home health needs.   Past Medical History:  Diagnosis Date  . Depression   . Diabetes mellitus without complication ([place])   . Essential hypertension   . Gastroparesis   . GERD (gastroesophageal reflux disease)   . HLD (hyperlipidemia)     Past Surgical History:  Procedure Laterality Date  . COLONOSCOPY  09/27/2014   at [facility]  . ESOPHAGOGASTRODUODENOSCOPY  09/27/2014   at [facility], Dr Rolan Lipa. biospy neg for celiac, neg for H pylori.   . EYE SURGERY    . gailstones    . IR FLUORO GUIDE CV LINE RIGHT  02/01/2017  . IR FLUORO GUIDE CV LINE RIGHT  03/06/2017  . IR FLUORO GUIDE CV LINE RIGHT  03/25/2017  . IR GENERIC HISTORICAL   01/24/2016   IR FLUORO GUIDE CV LINE RIGHT 01/24/2016 Darrell K Allred, PA-C [facility]  . IR GENERIC HISTORICAL  01/24/2016   IR US GUIDE VASC ACCESS RIGHT 01/24/2016 Darrell K Allred, PA-C [facility]  . IR US GUIDE VASC ACCESS RIGHT  02/01/2017  . IR US GUIDE VASC ACCESS RIGHT  03/06/2017  . IR US GUIDE VASC ACCESS RIGHT  03/25/2017  . POSTERIOR VITRECTOMY AND MEMBRANE PEEL-LEFT EYE  09/28/2002  . POSTERIOR VITRECTOMY AND MEMBRANE PEEL-RIGHT EYE  03/16/2002  . RETINAL DETACHMENT SURGERY      Social History   Social History  . Marital status: Single    Spouse name: N/A  . Number of children: N/A  . Years of education: N/A   Occupational History  . Not on file.   Social History Main Topics  . Smoking status: Never Smoker  . Smokeless tobacco: Never Used  . Alcohol use No  . Drug use: No  . Sexual activity: Not Currently   Other Topics Concern  . Not on file   Social History Narrative  . No narrative on file   Family History  Problem Relation Age of Onset  . Cystic fibrosis Mother   . Hypertension Father   . Diabetes Brother   . Hypertension Maternal Grandmother       VITAL SIGNS BP 140/69   Pulse  100   Temp 98 F (36.7 C)   Resp 19   Ht 5\' 2"  (1.575 m)   Wt 137 lb (62.1 kg)   SpO2 99%   BMI 25.06 kg/m   Patient's Medications  New Prescriptions   No medications on file  Previous Medications   BENZTROPINE (COGENTIN) 1 MG TABLET    Take 1 tablet (1 mg total) by mouth 2 (two) times daily.   BISACODYL (DULCOLAX) 10 MG SUPPOSITORY    Place 10 mg rectally daily as needed for moderate constipation.   CLONAZEPAM (KLONOPIN) 0.5 MG DISINTEGRATING TABLET    Take 1 tablet (0.5 mg total) by mouth 2 (two) times daily.   DIVALPROEX (DEPAKOTE SPRINKLE) 125 MG CAPSULE    Take 125 mg by mouth 2 (two) times daily.   FINASTERIDE (PROSCAR) 5 MG TABLET    Take 1 tablet (5 mg total) by mouth daily.   FUROSEMIDE (LASIX) 40 MG TABLET    Take 40 mg by mouth daily as needed for fluid.     INSULIN ASPART (NOVOLOG FLEXPEN) 100 UNIT/ML FLEXPEN    Inject as per sliding scale subcutaneously before meals and at bedtime 70 - 119 = 0 units 120 - 150 = 2 units 151 - 180 = 4 units 181 - 210 = 5 units Greater than 400 notify MD 211 - 240 = 6 units 241 - 270 = 8 units 271 - 300 = 9 units 301 - 330 = 10 units 331 - 360 = 11 units 361 - 400 = 12 units   INSULIN DETEMIR (LEVEMIR) 100 UNIT/ML INJECTION    Inject 0.08 mLs (8 Units total) into the skin 2 (two) times daily.   ONDANSETRON (ZOFRAN-ODT) 8 MG DISINTEGRATING TABLET    Take 1 tablet (8 mg total) by mouth every 8 (eight) hours.   PANTOPRAZOLE (PROTONIX) 40 MG TABLET    Take 1 tablet (40 mg total) by mouth daily.   POLYETHYLENE GLYCOL (MIRALAX / GLYCOLAX) PACKET    Take 17 g by mouth daily.   PREGABALIN (LYRICA) 150 MG CAPSULE    Take 1 capsule (150 mg total) by mouth 2 (two) times daily.   PROMETHAZINE (PHENERGAN) 25 MG SUPPOSITORY    Place 1 suppository (25 mg total) rectally every 6 (six) hours as needed for nausea.   PYRIDOSTIGMINE BROMIDE PO    Give 30 mg by mouth daily before meals   RECTAL CLEANSERS RE    Insert 1 unit rectally as needed fore constipation.  If no results from first enema, may give second enema.   SODIUM PHOSPHATES (FLEET ENEMA RE)    Insert 1 unit rectally as needed for constipation  If no results from first enema, may give second enema  Modified Medications   No medications on file  Discontinued Medications   DRONABINOL (MARINOL) 10 MG CAPSULE    Take 1 capsule (10 mg total) by mouth 2 (two) times daily before a meal.     SIGNIFICANT DIAGNOSTIC EXAMS  PREVIOUS:  03-25-17: chest x-ray: There is no active cardiopulmonary disease.  LABS REVIEWED: PREVIOUS:   02-02-17: vit B 12: 252; folate 11.65; iron 91; tibc 343 03-17-17: hgb a1c 10.1 03-24-17: wbc 6.7; hgb 9.5; hct 31.0; mcv 72.8; plt 467; glucose 294; bun 15; creat 0.95; k+ 4.1; na++ 137; ca 9.7; liver normal albumin 4.2;  03-25-17: blood cultures:  no growth; tsh 0.632 03-26-17: wbc 8.2; hgb 8.1; hct 27.2; mcv 73.5; plt 395; glucose 109; bun 7; creat 0.79;k+ 3.5; na++ 137;  ca 8.4; mag 1.7; phos 2.6 03-28-17; glucose 176; bun <5; creat 0.92; k+ 4.3; na++ 131; ca 8.7 03-30-17: wbc 6.8; hgb 8.5; hct 28.0; mcv 72.9; plt 350; glucose 121; bun <5; creat 0.84 ;k+ 4.5; na++ 132; ca 8.7 04-01-17: wbc 5.7; hgb 7.0; hct 23.3; mcv 73.3; plt 291; glucose 190 ;bun 6; creat 0.83; k+ 4.6; n++ 132; ca 8.6; liver normal albumin 2.6 04-03-17: wbc 5.1; hgb 8.2; hct 27.9; mcv 74.6; plt 280; glucose 167; bun 10; creat 0.87; k+ 4.1; na++ 136; ca 8.6   NO NEW LABS     Review of Systems  Constitutional: Negative for malaise/fatigue.  Respiratory: Negative for cough and shortness of breath.   Cardiovascular: Negative for chest pain, palpitations and leg swelling.  Gastrointestinal: Negative for abdominal pain, constipation and heartburn.  Musculoskeletal: Negative for back pain, joint pain and myalgias.  Skin: Negative.   Neurological: Negative for dizziness.         Has bilateral lower extremity neuropathic pain with numbness and tingling present    Psychiatric/Behavioral: The patient is not nervous/anxious.     Physical Exam  Constitutional: No distress.  thin  Neck: Neck supple. No thyromegaly present.  Cardiovascular: Normal rate, regular rhythm, normal heart sounds and intact distal pulses.  Pulmonary/Chest: Effort normal and breath sounds normal. No respiratory distress.  Abdominal: Soft. Bowel sounds are normal. She exhibits no distension. There is no tenderness.  Musculoskeletal: Normal range of motion. She exhibits edema.  Trace lower extremity edema   Lymphadenopathy:    She has no cervical adenopathy.  Neurological: She is alert.  Skin: Skin is warm and dry. She is not diaphoretic.  Psychiatric: She has a normal mood and affect.    ASSESSMENT/ PLAN:  TODAY:   1. Diabetic neuropathy type I diabetes:  2. Acute dystonic reaction due to  drugs  3. Tardive dyskinesia   Will continue her current plan of care Will anticipate her upcoming discharge  Time spent with patient family and care plan team: 40 minutes: discussed medical condition; medication regimen; discharge goals; expectations and needed dme and home health. Verbalized understanding.      MD is aware of resident's narcotic use and is in agreement with current plan of care. We will attempt to wean resident as apropriate     Ok Edwards NP [facility]  Contact [phone] Monday through Friday 8am- 5pm  After hours call [phone]  '

## 2017-04-30 ENCOUNTER — Encounter: Payer: Self-pay | Admitting: Adult Health

## 2017-04-30 ENCOUNTER — Non-Acute Institutional Stay (SKILLED_NURSING_FACILITY): Payer: Medicaid Other | Admitting: Adult Health

## 2017-04-30 DIAGNOSIS — G2402 Drug induced acute dystonia: Secondary | ICD-10-CM

## 2017-04-30 DIAGNOSIS — G2401 Drug induced subacute dyskinesia: Secondary | ICD-10-CM

## 2017-04-30 DIAGNOSIS — I10 Essential (primary) hypertension: Secondary | ICD-10-CM

## 2017-04-30 DIAGNOSIS — E1040 Type 1 diabetes mellitus with diabetic neuropathy, unspecified: Secondary | ICD-10-CM

## 2017-04-30 DIAGNOSIS — E1143 Type 2 diabetes mellitus with diabetic autonomic (poly)neuropathy: Secondary | ICD-10-CM | POA: Diagnosis not present

## 2017-04-30 DIAGNOSIS — K3184 Gastroparesis: Secondary | ICD-10-CM

## 2017-04-30 NOTE — Progress Notes (Signed)
Location:   [facility] Room Number: 127 A Place of Service:  SNF (31)    CODE STATUS:  Full Code  Allergies  Allergen Reactions  . Anesthetics, Amide Nausea And Vomiting  . Penicillins Diarrhea and Nausea And Vomiting    Has patient had a PCN reaction causing immediate rash, facial/tongue/throat swelling, SOB or lightheadedness with hypotension: No Has patient had a PCN reaction causing severe rash involving mucus membranes or skin necrosis: No Has patient had a PCN reaction that required hospitalization No Has patient had a PCN reaction occurring within the last 10 years: Yes  If all of the above answers are "NO", then may proceed with Cephalosporin use.   . Buprenorphine Hcl Rash  . Encainide Nausea And Vomiting  . Metoclopramide Other (See Comments)    Dystonia, muscle rigidity.     Chief Complaint  Patient presents with  . Discharge Note    Discharging to Home on 05/03/17    HPI:  She is being discharged to home with home health for pt/ot/rn/cna sw. She will need a front wheel walker. She will need her prescriptions to be written and will need to follow up with her medical provider. She will be going home with family as she will need 24 hour supervision.  She had been hospitalized for acute dystonia. She was admitted to this facility for short term rehab and is now ready to go to home.    Past Medical History:  Diagnosis Date  . Depression   . Diabetes mellitus without complication ([place])   . Essential hypertension   . Gastroparesis   . GERD (gastroesophageal reflux disease)   . HLD (hyperlipidemia)     Past Surgical History:  Procedure Laterality Date  . COLONOSCOPY  09/27/2014   at [facility]  . ESOPHAGOGASTRODUODENOSCOPY  09/27/2014   at [facility], Dr Rolan Lipa. biospy neg for celiac, neg for H pylori.   . EYE SURGERY    . gailstones    . IR FLUORO GUIDE CV LINE RIGHT  02/01/2017  . IR FLUORO GUIDE CV LINE RIGHT  03/06/2017  . IR FLUORO GUIDE CV  LINE RIGHT  03/25/2017  . IR GENERIC HISTORICAL  01/24/2016   IR FLUORO GUIDE CV LINE RIGHT 01/24/2016 Darrell K Allred, PA-C [facility]  . IR GENERIC HISTORICAL  01/24/2016   IR US GUIDE VASC ACCESS RIGHT 01/24/2016 Darrell K Allred, PA-C [facility]  . IR US GUIDE VASC ACCESS RIGHT  02/01/2017  . IR US GUIDE VASC ACCESS RIGHT  03/06/2017  . IR US GUIDE VASC ACCESS RIGHT  03/25/2017  . POSTERIOR VITRECTOMY AND MEMBRANE PEEL-LEFT EYE  09/28/2002  . POSTERIOR VITRECTOMY AND MEMBRANE PEEL-RIGHT EYE  03/16/2002  . RETINAL DETACHMENT SURGERY      Social History   Socioeconomic History  . Marital status: Single    Spouse name: Not on file  . Number of children: Not on file  . Years of education: Not on file  . Highest education level: Not on file  Social Needs  . Financial resource strain: Not on file  . Food insecurity - worry: Not on file  . Food insecurity - inability: Not on file  . Transportation needs - medical: Not on file  . Transportation needs - non-medical: Not on file  Occupational History  . Not on file  Tobacco Use  . Smoking status: Never Smoker  . Smokeless tobacco: Never Used  Substance and Sexual Activity  . Alcohol use: No  . Drug  use: No  . Sexual activity: Not Currently  Other Topics Concern  . Not on file  Social History Narrative  . Not on file   Family History  Problem Relation Age of Onset  . Cystic fibrosis Mother   . Hypertension Father   . Diabetes Brother   . Hypertension Maternal Grandmother     VITAL SIGNS Ht 5\' 2"  (1.575 m)   Wt 139 lb 12.8 oz (63.4 kg)   BMI 25.57 kg/m     Medication List        Accurate as of 04/30/17 11:58 AM. Always use your most recent med list.          benztropine 1 MG tablet Commonly known as:  COGENTIN Take 1 tablet (1 mg total) by mouth 2 (two) times daily.   bisacodyl 10 MG suppository Commonly known as:  DULCOLAX   clonazePAM 0.5 MG disintegrating tablet Commonly known as:  KLONOPIN Take 1 tablet  (0.5 mg total) by mouth 2 (two) times daily.   divalproex 125 MG capsule Commonly known as:  DEPAKOTE SPRINKLE   finasteride 5 MG tablet Commonly known as:  PROSCAR Take 1 tablet (5 mg total) by mouth daily.   FLEET ENEMA RE   furosemide 40 MG tablet Commonly known as:  LASIX   gabapentin 300 MG capsule Commonly known as:  NEURONTIN   LEVEMIR FLEXPEN 100 UNIT/ML Pen Generic drug:  Insulin Detemir   NOVOLOG FLEXPEN 100 UNIT/ML FlexPen Generic drug:  insulin aspart   ondansetron 8 MG disintegrating tablet Commonly known as:  ZOFRAN-ODT Take 1 tablet (8 mg total) by mouth every 8 (eight) hours.   pantoprazole 40 MG tablet Commonly known as:  PROTONIX Take 1 tablet (40 mg total) by mouth daily.   polyethylene glycol packet Commonly known as:  MIRALAX / GLYCOLAX   promethazine 25 MG suppository Commonly known as:  PHENERGAN Place 1 suppository (25 mg total) rectally every 6 (six) hours as needed for nausea.   PYRIDOSTIGMINE BROMIDE PO        SIGNIFICANT DIAGNOSTIC EXAMS   PREVIOUS:  03-25-17: chest x-ray: There is no active cardiopulmonary disease.  LABS REVIEWED: PREVIOUS:   02-02-17: vit B 12: 252; folate 11.65; iron 91; tibc 343 03-17-17: hgb a1c 10.1 03-24-17: wbc 6.7; hgb 9.5; hct 31.0; mcv 72.8; plt 467; glucose 294; bun 15; creat 0.95; k+ 4.1; na++ 137; ca 9.7; liver normal albumin 4.2;  03-25-17: blood cultures: no growth; tsh 0.632 03-26-17: wbc 8.2; hgb 8.1; hct 27.2; mcv 73.5; plt 395; glucose 109; bun 7; creat 0.79;k+ 3.5; na++ 137; ca 8.4; mag 1.7; phos 2.6 03-28-17; glucose 176; bun <5; creat 0.92; k+ 4.3; na++ 131; ca 8.7 03-30-17: wbc 6.8; hgb 8.5; hct 28.0; mcv 72.9; plt 350; glucose 121; bun <5; creat 0.84 ;k+ 4.5; na++ 132; ca 8.7 04-01-17: wbc 5.7; hgb 7.0; hct 23.3; mcv 73.3; plt 291; glucose 190 ;bun 6; creat 0.83; k+ 4.6; n++ 132; ca 8.6; liver normal albumin 2.6 04-03-17: wbc 5.1; hgb 8.2; hct 27.9; mcv 74.6; plt 280; glucose 167; bun 10; creat  0.87; k+ 4.1; na++ 136; ca 8.6   NO NEW LABS    Review of Systems  Constitutional: Negative for malaise/fatigue.  Respiratory: Negative for cough and shortness of breath.   Cardiovascular: Negative for chest pain, palpitations and leg swelling.  Gastrointestinal: Negative for abdominal pain, constipation and heartburn.  Musculoskeletal: Negative for back pain, joint pain and myalgias.  Skin: Negative.   Neurological: Negative for dizziness.  Psychiatric/Behavioral: The patient is not nervous/anxious.     Physical Exam  Constitutional: No distress.  Thin   Neck: Neck supple. No thyromegaly present.  Cardiovascular: Normal rate, regular rhythm, normal heart sounds and intact distal pulses.  Pulmonary/Chest: Effort normal and breath sounds normal. No respiratory distress.  Abdominal: Soft. Bowel sounds are normal. She exhibits no distension. There is no tenderness.  Musculoskeletal: Normal range of motion. She exhibits edema.  Trace lower extremity edema   Lymphadenopathy:    She has no cervical adenopathy.  Neurological: She is alert.  Skin: Skin is warm and dry. She is not diaphoretic.    ASSESSMENT/ PLAN:  Patient is being discharged with the following home health services:  Pt/ot/rn/cna/sw: to evaluate and treat as indicated for gait balance strength adl training medication management; adl care; resource outreach.   Patient is being discharged with the following durable medical equipment:  Front wheel walker in order to allow her to maintain her current level of independence with her adls  Patient has been advised to f/u with their PCP in 1-2 weeks to bring them up to date on their rehab stay.  Social services at facility was responsible for arranging this appointment.  Pt was provided with a 30 day supply of prescriptions for medications and refills must be obtained from their PCP.  For controlled substances, a more limited supply may be provided adequate until PCP appointment  only.   A prescription for a 30 day supply of medications have been written per the medication list as above.   Time spent with patient; family regarding her discharge: 40 minutes: to include education on home health services and what to expect; her medications and needed dme. Verbalized understanding.   Ok Edwards NP [facility]  Contact [phone] Monday through Friday 8am- 5pm  After hours call [phone]

## 2017-05-03 ENCOUNTER — Telehealth: Payer: Self-pay | Admitting: Family Medicine

## 2017-05-03 NOTE — Telephone Encounter (Signed)
[place] called to inform that [facility] PT and OT will open her up on Monday.

## 2017-05-06 ENCOUNTER — Inpatient Hospital Stay (HOSPITAL_COMMUNITY)
Admission: EM | Admit: 2017-05-06 | Discharge: 2017-05-09 | DRG: 638 | Disposition: A | Discharge status: Home Health | Payer: Medicaid Other | Attending: Internal Medicine | Admitting: Internal Medicine

## 2017-05-06 ENCOUNTER — Encounter (HOSPITAL_COMMUNITY): Payer: Self-pay

## 2017-05-06 ENCOUNTER — Other Ambulatory Visit: Payer: Self-pay

## 2017-05-06 DIAGNOSIS — G2401 Drug induced subacute dyskinesia: Secondary | ICD-10-CM | POA: Diagnosis present

## 2017-05-06 DIAGNOSIS — E1043 Type 1 diabetes mellitus with diabetic autonomic (poly)neuropathy: Secondary | ICD-10-CM | POA: Diagnosis present

## 2017-05-06 DIAGNOSIS — E1110 Type 2 diabetes mellitus with ketoacidosis without coma: Secondary | ICD-10-CM | POA: Diagnosis present

## 2017-05-06 DIAGNOSIS — E1010 Type 1 diabetes mellitus with ketoacidosis without coma: Principal | ICD-10-CM | POA: Diagnosis present

## 2017-05-06 DIAGNOSIS — E785 Hyperlipidemia, unspecified: Secondary | ICD-10-CM | POA: Diagnosis present

## 2017-05-06 DIAGNOSIS — Z91199 Patient's noncompliance with other medical treatment and regimen due to unspecified reason: Secondary | ICD-10-CM

## 2017-05-06 DIAGNOSIS — R112 Nausea with vomiting, unspecified: Secondary | ICD-10-CM | POA: Diagnosis not present

## 2017-05-06 DIAGNOSIS — N179 Acute kidney failure, unspecified: Secondary | ICD-10-CM | POA: Diagnosis present

## 2017-05-06 DIAGNOSIS — F419 Anxiety disorder, unspecified: Secondary | ICD-10-CM | POA: Diagnosis present

## 2017-05-06 DIAGNOSIS — K219 Gastro-esophageal reflux disease without esophagitis: Secondary | ICD-10-CM | POA: Diagnosis present

## 2017-05-06 DIAGNOSIS — Z884 Allergy status to anesthetic agent status: Secondary | ICD-10-CM | POA: Diagnosis not present

## 2017-05-06 DIAGNOSIS — Z9119 Patient's noncompliance with other medical treatment and regimen: Secondary | ICD-10-CM | POA: Diagnosis not present

## 2017-05-06 DIAGNOSIS — Z880 Allergy status to penicillin: Secondary | ICD-10-CM

## 2017-05-06 DIAGNOSIS — E1040 Type 1 diabetes mellitus with diabetic neuropathy, unspecified: Secondary | ICD-10-CM | POA: Diagnosis not present

## 2017-05-06 DIAGNOSIS — F32A Depression, unspecified: Secondary | ICD-10-CM

## 2017-05-06 DIAGNOSIS — I10 Essential (primary) hypertension: Secondary | ICD-10-CM | POA: Diagnosis present

## 2017-05-06 DIAGNOSIS — K3184 Gastroparesis: Secondary | ICD-10-CM | POA: Diagnosis present

## 2017-05-06 DIAGNOSIS — Z8249 Family history of ischemic heart disease and other diseases of the circulatory system: Secondary | ICD-10-CM

## 2017-05-06 DIAGNOSIS — E1143 Type 2 diabetes mellitus with diabetic autonomic (poly)neuropathy: Secondary | ICD-10-CM | POA: Diagnosis present

## 2017-05-06 DIAGNOSIS — F329 Major depressive disorder, single episode, unspecified: Secondary | ICD-10-CM | POA: Diagnosis present

## 2017-05-06 DIAGNOSIS — N390 Urinary tract infection, site not specified: Secondary | ICD-10-CM | POA: Diagnosis present

## 2017-05-06 DIAGNOSIS — E875 Hyperkalemia: Secondary | ICD-10-CM | POA: Diagnosis present

## 2017-05-06 DIAGNOSIS — Z833 Family history of diabetes mellitus: Secondary | ICD-10-CM | POA: Diagnosis not present

## 2017-05-06 DIAGNOSIS — Z794 Long term (current) use of insulin: Secondary | ICD-10-CM

## 2017-05-06 DIAGNOSIS — Z79899 Other long term (current) drug therapy: Secondary | ICD-10-CM

## 2017-05-06 DIAGNOSIS — F410 Panic disorder [episodic paroxysmal anxiety] without agoraphobia: Secondary | ICD-10-CM | POA: Diagnosis present

## 2017-05-06 DIAGNOSIS — T50905A Adverse effect of unspecified drugs, medicaments and biological substances, initial encounter: Secondary | ICD-10-CM | POA: Diagnosis present

## 2017-05-06 DIAGNOSIS — Z888 Allergy status to other drugs, medicaments and biological substances status: Secondary | ICD-10-CM

## 2017-05-06 DIAGNOSIS — Z9111 Patient's noncompliance with dietary regimen: Secondary | ICD-10-CM | POA: Diagnosis not present

## 2017-05-06 DIAGNOSIS — E1069 Type 1 diabetes mellitus with other specified complication: Secondary | ICD-10-CM

## 2017-05-06 LAB — BASIC METABOLIC PANEL
ANION GAP: 19 — AB (ref 5–15)
ANION GAP: 9 (ref 5–15)
Anion gap: 9 (ref 5–15)
BUN: 22 mg/dL — ABNORMAL HIGH (ref 6–20)
BUN: 23 mg/dL — ABNORMAL HIGH (ref 6–20)
BUN: 24 mg/dL — ABNORMAL HIGH (ref 6–20)
CHLORIDE: 106 mmol/L (ref 101–111)
CHLORIDE: 112 mmol/L — AB (ref 101–111)
CO2: 12 mmol/L — AB (ref 22–32)
CO2: 17 mmol/L — AB (ref 22–32)
CO2: 17 mmol/L — AB (ref 22–32)
CREATININE: 1.3 mg/dL — AB (ref 0.44–1.00)
CREATININE: 1.49 mg/dL — AB (ref 0.44–1.00)
CREATININE: 1.72 mg/dL — AB (ref 0.44–1.00)
Calcium: 9.7 mg/dL (ref 8.9–10.3)
Calcium: 9.4 mg/dL (ref 8.9–10.3)
Calcium: 9.3 mg/dL (ref 8.9–10.3)
Chloride: 112 mmol/L — ABNORMAL HIGH (ref 101–111)
GFR calc Af Amer: 47 mL/min — ABNORMAL LOW (ref >60)
GFR calc non Af Amer: 47 mL/min — ABNORMAL LOW (ref >60)
GFR calc non Af Amer: 40 mL/min — ABNORMAL LOW (ref >60)
GFR calc non Af Amer: 34 mL/min — ABNORMAL LOW (ref >60)
GFR, EST AFRICAN AMERICAN: 55 mL/min — AB (ref >60)
GFR, EST AFRICAN AMERICAN: 39 mL/min — AB (ref >60)
Glucose, Bld: 341 mg/dL — ABNORMAL HIGH (ref 65–99)
Glucose, Bld: 153 mg/dL — ABNORMAL HIGH (ref 65–99)
Glucose, Bld: 116 mg/dL — ABNORMAL HIGH (ref 65–99)
POTASSIUM: 5.9 mmol/L — AB (ref 3.5–5.1)
Potassium: 4 mmol/L (ref 3.5–5.1)
Potassium: 4.2 mmol/L (ref 3.5–5.1)
SODIUM: 137 mmol/L (ref 135–145)
SODIUM: 138 mmol/L (ref 135–145)
Sodium: 138 mmol/L (ref 135–145)

## 2017-05-06 LAB — GLUCOSE, CAPILLARY
GLUCOSE-CAPILLARY: 382 mg/dL — AB (ref 65–99)
GLUCOSE-CAPILLARY: 326 mg/dL — AB (ref 65–99)
GLUCOSE-CAPILLARY: 309 mg/dL — AB (ref 65–99)
GLUCOSE-CAPILLARY: 227 mg/dL — AB (ref 65–99)
GLUCOSE-CAPILLARY: 112 mg/dL — AB (ref 65–99)
GLUCOSE-CAPILLARY: 139 mg/dL — AB (ref 65–99)
Glucose-Capillary: 121 mg/dL — ABNORMAL HIGH (ref 65–99)
Glucose-Capillary: 137 mg/dL — ABNORMAL HIGH (ref 65–99)
Glucose-Capillary: 152 mg/dL — ABNORMAL HIGH (ref 65–99)
Glucose-Capillary: 167 mg/dL — ABNORMAL HIGH (ref 65–99)
Glucose-Capillary: 190 mg/dL — ABNORMAL HIGH (ref 65–99)
Glucose-Capillary: 190 mg/dL — ABNORMAL HIGH (ref 65–99)

## 2017-05-06 LAB — CBC WITH DIFFERENTIAL/PLATELET
BASOS ABS: 0 10*3/uL (ref 0.0–0.1)
Basophils Relative: 0 %
EOS ABS: 0 10*3/uL (ref 0.0–0.7)
EOS PCT: 0 %
HCT: 42.9 % (ref 36.0–46.0)
Hemoglobin: 13.9 g/dL (ref 12.0–15.0)
LYMPHS PCT: 6 %
Lymphs Abs: 0.8 10*3/uL (ref 0.7–4.0)
MCH: 25.7 pg — ABNORMAL LOW (ref 26.0–34.0)
MCHC: 32.4 g/dL (ref 30.0–36.0)
MCV: 79.4 fL (ref 78.0–100.0)
Monocytes Absolute: 0.4 10*3/uL (ref 0.1–1.0)
Monocytes Relative: 3 %
NEUTROS PCT: 91 %
Neutro Abs: 13.3 10*3/uL — ABNORMAL HIGH (ref 1.7–7.7)
PLATELETS: 356 10*3/uL (ref 150–400)
RBC: 5.4 MIL/uL — AB (ref 3.87–5.11)
RDW: 20.2 % — ABNORMAL HIGH (ref 11.5–15.5)
WBC: 14.5 10*3/uL — AB (ref 4.0–10.5)

## 2017-05-06 LAB — COMPREHENSIVE METABOLIC PANEL
ALT: 16 U/L (ref 14–54)
AST: 31 U/L (ref 15–41)
Albumin: 4.6 g/dL (ref 3.5–5.0)
Alkaline Phosphatase: 122 U/L (ref 38–126)
Anion gap: 24 — ABNORMAL HIGH (ref 5–15)
BUN: 20 mg/dL (ref 6–20)
CHLORIDE: 101 mmol/L (ref 101–111)
CO2: 12 mmol/L — AB (ref 22–32)
CREATININE: 1.28 mg/dL — AB (ref 0.44–1.00)
Calcium: 10.6 mg/dL — ABNORMAL HIGH (ref 8.9–10.3)
GFR calc Af Amer: 56 mL/min — ABNORMAL LOW (ref >60)
GFR calc non Af Amer: 48 mL/min — ABNORMAL LOW (ref >60)
Glucose, Bld: 398 mg/dL — ABNORMAL HIGH (ref 65–99)
Potassium: 4.3 mmol/L (ref 3.5–5.1)
SODIUM: 137 mmol/L (ref 135–145)
Total Bilirubin: 1.7 mg/dL — ABNORMAL HIGH (ref 0.3–1.2)
Total Protein: 10 g/dL — ABNORMAL HIGH (ref 6.5–8.1)

## 2017-05-06 LAB — RAPID URINE DRUG SCREEN, HOSP PERFORMED
Amphetamines: NOT DETECTED
BARBITURATES: NOT DETECTED
Benzodiazepines: NOT DETECTED
COCAINE: NOT DETECTED
OPIATES: NOT DETECTED
TETRAHYDROCANNABINOL: NOT DETECTED

## 2017-05-06 LAB — URINALYSIS, ROUTINE W REFLEX MICROSCOPIC
Bilirubin Urine: NEGATIVE
Glucose, UA: >=500 mg/dL — AB
Ketones, ur: 80 mg/dL — AB
Leukocytes, UA: NEGATIVE
Nitrite: NEGATIVE
Protein, ur: 100 mg/dL — AB
SPECIFIC GRAVITY, URINE: 1.022 (ref 1.005–1.030)
pH: 6 (ref 5.0–8.0)

## 2017-05-06 LAB — MRSA PCR SCREENING: MRSA by PCR: NEGATIVE

## 2017-05-06 LAB — CBG MONITORING, ED: GLUCOSE-CAPILLARY: 380 mg/dL — AB (ref 65–99)

## 2017-05-06 LAB — LIPASE, BLOOD: LIPASE: 16 U/L (ref 11–51)

## 2017-05-06 LAB — ETHANOL: Alcohol, Ethyl (B): <10 mg/dL (ref <10)

## 2017-05-06 MED ORDER — PREGABALIN 75 MG PO CAPS
150.0000 mg | ORAL_CAPSULE | Freq: Two times a day (BID) | ORAL | Status: DC
  Administered 2017-05-06 – 2017-05-09 (×6): 150 mg via ORAL
  Filled 2017-05-06 (×2): qty 3
  Filled 2017-05-06: qty 2
  Filled 2017-05-06: qty 3
  Filled 2017-05-06: qty 2
  Filled 2017-05-06: qty 3

## 2017-05-06 MED ORDER — BENZTROPINE MESYLATE 0.5 MG PO TABS
1.0000 mg | ORAL_TABLET | Freq: Two times a day (BID) | ORAL | Status: DC
  Administered 2017-05-06 – 2017-05-09 (×6): 1 mg via ORAL
  Filled 2017-05-06: qty 2
  Filled 2017-05-06: qty 1
  Filled 2017-05-06 (×3): qty 2
  Filled 2017-05-06 (×3): qty 1
  Filled 2017-05-06 (×2): qty 2
  Filled 2017-05-06: qty 1

## 2017-05-06 MED ORDER — DEXTROSE-NACL 5-0.45 % IV SOLN
INTRAVENOUS | Status: DC
  Administered 2017-05-06: 16:00:00 via INTRAVENOUS

## 2017-05-06 MED ORDER — DIPHENHYDRAMINE HCL 50 MG/ML IJ SOLN
25.0000 mg | Freq: Once | INTRAMUSCULAR | Status: AC
  Administered 2017-05-06: 25 mg via INTRAVENOUS
  Filled 2017-05-06: qty 1

## 2017-05-06 MED ORDER — DIVALPROEX SODIUM 125 MG PO CSDR
125.0000 mg | DELAYED_RELEASE_CAPSULE | Freq: Two times a day (BID) | ORAL | Status: DC
Start: 2017-05-06 — End: 2017-05-09
  Administered 2017-05-06 – 2017-05-09 (×6): 125 mg via ORAL
  Filled 2017-05-06 (×8): qty 1

## 2017-05-06 MED ORDER — MORPHINE SULFATE (PF) 4 MG/ML IV SOLN: 1.0000 mg | INTRAVENOUS | Status: DC | PRN

## 2017-05-06 MED ORDER — ONDANSETRON HCL 4 MG/2ML IJ SOLN
4.0000 mg | Freq: Four times a day (QID) | INTRAMUSCULAR | Status: DC | PRN
  Administered 2017-05-06: 4 mg via INTRAVENOUS
  Filled 2017-05-06: qty 2

## 2017-05-06 MED ORDER — SODIUM CHLORIDE 0.9 % IV SOLN
INTRAVENOUS | Status: DC
  Administered 2017-05-06: 13:00:00 via INTRAVENOUS

## 2017-05-06 MED ORDER — FINASTERIDE 5 MG PO TABS
5.0000 mg | ORAL_TABLET | Freq: Every day | ORAL | Status: DC
  Administered 2017-05-07 – 2017-05-09 (×3): 5 mg via ORAL
  Filled 2017-05-06 (×3): qty 1

## 2017-05-06 MED ORDER — SODIUM CHLORIDE 0.9 % IV SOLN
INTRAVENOUS | Status: DC
  Administered 2017-05-06: 1 [IU]/h via INTRAVENOUS
  Filled 2017-05-06: qty 1

## 2017-05-06 MED ORDER — DEXTROSE-NACL 5-0.45 % IV SOLN
INTRAVENOUS | Status: DC
  Administered 2017-05-06: 11:00:00 via INTRAVENOUS

## 2017-05-06 MED ORDER — HYDRALAZINE HCL 20 MG/ML IJ SOLN: 5.0000 mg | INTRAMUSCULAR | Status: DC | PRN

## 2017-05-06 MED ORDER — SODIUM CHLORIDE 0.9 % IV SOLN
Freq: Once | INTRAVENOUS | Status: AC
  Administered 2017-05-06: 10:00:00 via INTRAVENOUS

## 2017-05-06 MED ORDER — SODIUM CHLORIDE 0.9 % IV BOLUS (SEPSIS)
1000.0000 mL | Freq: Once | INTRAVENOUS | Status: AC
  Administered 2017-05-06: 1000 mL via INTRAVENOUS

## 2017-05-06 MED ORDER — LORAZEPAM 2 MG/ML IJ SOLN
1.0000 mg | Freq: Once | INTRAMUSCULAR | Status: AC
Start: 2017-05-06 — End: 2017-05-06
  Administered 2017-05-06: 1 mg via INTRAVENOUS
  Filled 2017-05-06: qty 1

## 2017-05-06 MED ORDER — POTASSIUM CHLORIDE 10 MEQ/100ML IV SOLN
10.0000 meq | INTRAVENOUS | Status: AC
  Administered 2017-05-06 (×2): 10 meq via INTRAVENOUS
  Filled 2017-05-06 (×2): qty 100

## 2017-05-06 MED ORDER — ACETAMINOPHEN 650 MG RE SUPP: 650.0000 mg | Freq: Four times a day (QID) | RECTAL | Status: DC | PRN

## 2017-05-06 MED ORDER — SODIUM CHLORIDE 0.9 % IV SOLN
8.0000 mg | Freq: Once | INTRAVENOUS | Status: AC
  Administered 2017-05-06: 8 mg via INTRAVENOUS
  Filled 2017-05-06: qty 4

## 2017-05-06 MED ORDER — ACETAMINOPHEN 325 MG PO TABS: 650.0000 mg | ORAL_TABLET | Freq: Four times a day (QID) | ORAL | Status: DC | PRN

## 2017-05-06 MED ORDER — PROMETHAZINE HCL 25 MG/ML IJ SOLN: 12.5000 mg | Freq: Four times a day (QID) | INTRAMUSCULAR | Status: DC | PRN

## 2017-05-06 MED ORDER — ENOXAPARIN SODIUM 40 MG/0.4ML ~~LOC~~ SOLN
40.0000 mg | SUBCUTANEOUS | Status: DC
  Administered 2017-05-06: 40 mg via SUBCUTANEOUS
  Filled 2017-05-06: qty 0.4

## 2017-05-06 MED ORDER — SODIUM CHLORIDE 0.9 % IV SOLN
INTRAVENOUS | Status: DC
  Filled 2017-05-06: qty 1

## 2017-05-06 MED ORDER — KETOROLAC TROMETHAMINE 30 MG/ML IJ SOLN
30.0000 mg | Freq: Four times a day (QID) | INTRAMUSCULAR | Status: DC | PRN
  Administered 2017-05-06: 30 mg via INTRAVENOUS
  Filled 2017-05-06: qty 1

## 2017-05-06 MED ORDER — CLONAZEPAM 0.5 MG PO TBDP
0.5000 mg | ORAL_TABLET | Freq: Two times a day (BID) | ORAL | Status: DC
  Administered 2017-05-06 – 2017-05-09 (×7): 0.5 mg via ORAL
  Filled 2017-05-06 (×7): qty 1

## 2017-05-06 MED ORDER — SODIUM CHLORIDE 0.9 % IV SOLN: INTRAVENOUS | Status: AC

## 2017-05-06 MED ORDER — PYRIDOSTIGMINE BROMIDE 60 MG PO TABS
30.0000 mg | ORAL_TABLET | Freq: Three times a day (TID) | ORAL | Status: DC
  Administered 2017-05-07 – 2017-05-09 (×8): 30 mg via ORAL
  Filled 2017-05-06 (×12): qty 0.5

## 2017-05-06 MED ORDER — PANTOPRAZOLE SODIUM 40 MG PO TBEC
40.0000 mg | DELAYED_RELEASE_TABLET | Freq: Every day | ORAL | Status: DC
  Administered 2017-05-07 – 2017-05-09 (×3): 40 mg via ORAL
  Filled 2017-05-06 (×3): qty 1

## 2017-05-06 MED ORDER — LORAZEPAM 2 MG/ML IJ SOLN
1.0000 mg | INTRAMUSCULAR | Status: DC | PRN
  Administered 2017-05-06: 1 mg via INTRAVENOUS
  Filled 2017-05-06: qty 1

## 2017-05-06 NOTE — ED Triage Notes (Signed)
Pt arrived EMS from home EMS reports, panic attack and hyperglycemia, non-compliant pt states last insulin self admin yesterday  BP 175/117 HR 95 Resp 22 Sao2 100 RA CBG 401

## 2017-05-06 NOTE — ED Provider Notes (Signed)
[facility] DEPT Provider Note   CSN: [id number] Arrival date & time: 05/06/17  8786     History   Chief Complaint Chief Complaint  Patient presents with  . Hyperglycemia    HPI [name] is a 51 y.o. female. Chief complaint is panic attack, high blood sugar, diabetes.  HPI 51 year old female with frequent DKA. Also has history of gastroparesis. Vomiting and abdominal pain since yesterday with panic attack today. Last insulin yesterday. Blood sugar 41 per paramedics. On most recent admission was having dystonia as well as DKA. Medications were adjusted. Remains on clonazepam. States she feels anxious and panicked. His difficulty speaking. Feels like she has difficulty speaking.  Past Medical History:  Diagnosis Date  . Depression   . Diabetes mellitus without complication ([place])   . Essential hypertension   . Gastroparesis   . GERD (gastroesophageal reflux disease)   . HLD (hyperlipidemia)     Patient Active Problem List   Diagnosis Date Noted  . Severe protein-calorie malnutrition ([place]) 04/17/2017  . Acute dystonic reaction due to drugs 04/01/2017  . Serotonin syndrome 03/25/2017  . Tardive dyskinesia   . Tachycardia 03/16/2017  . Diarrhea 03/05/2017  . Noncompliance with treatment plan 03/13/2016  . Essential hypertension 01/24/2016  . Abnormal ECG 11/27/2015  . HLD (hyperlipidemia)   . GERD (gastroesophageal reflux disease)   . Anemia, iron deficiency   . DKA, type 1, not at goal [facility]   . Vitamin B12 deficiency 08/16/2015  . Diabetic gastroparesis ([place])   . Diabetic neuropathy, type I diabetes mellitus ([place]) 05/18/2015  . Microcytic anemia 05/18/2015  . Anxiety and depression 05/18/2015    Past Surgical History:  Procedure Laterality Date  . COLONOSCOPY  09/27/2014   at [facility]  . ESOPHAGOGASTRODUODENOSCOPY  09/27/2014   at [facility], Dr Rolan Lipa. biospy neg for celiac, neg for H pylori.   . EYE SURGERY    . gailstones      . IR FLUORO GUIDE CV LINE RIGHT  02/01/2017  . IR FLUORO GUIDE CV LINE RIGHT  03/06/2017  . IR FLUORO GUIDE CV LINE RIGHT  03/25/2017  . IR GENERIC HISTORICAL  01/24/2016   IR FLUORO GUIDE CV LINE RIGHT 01/24/2016 Darrell K Allred, PA-C [facility]  . IR GENERIC HISTORICAL  01/24/2016   IR US GUIDE VASC ACCESS RIGHT 01/24/2016 Darrell K Allred, PA-C [facility]  . IR US GUIDE VASC ACCESS RIGHT  02/01/2017  . IR US GUIDE VASC ACCESS RIGHT  03/06/2017  . IR US GUIDE VASC ACCESS RIGHT  03/25/2017  . POSTERIOR VITRECTOMY AND MEMBRANE PEEL-LEFT EYE  09/28/2002  . POSTERIOR VITRECTOMY AND MEMBRANE PEEL-RIGHT EYE  03/16/2002  . RETINAL DETACHMENT SURGERY      OB History    No data available       Home Medications    Prior to Admission medications   Medication Sig Start Date End Date Taking? Authorizing Provider  benztropine (COGENTIN) 1 MG tablet Take 1 tablet (1 mg total) by mouth 2 (two) times daily. 04/03/17   Dessa Phi Chahn-Yang, DO  bisacodyl (DULCOLAX) 10 MG suppository Place 10 mg rectally daily as needed for moderate constipation. 04/16/17   [provider]  clonazePAM (KLONOPIN) 0.5 MG disintegrating tablet Take 1 tablet (0.5 mg total) by mouth 2 (two) times daily. 04/15/17   Gerlene Fee, NP  divalproex (DEPAKOTE SPRINKLE) 125 MG capsule Take 125 mg by mouth 2 (two) times daily. 04/16/17   [provider]  finasteride (  PROSCAR) 5 MG tablet Take 1 tablet (5 mg total) by mouth daily. 04/04/17   Dessa Phi Chahn-Yang, DO  furosemide (LASIX) 40 MG tablet Take 40 mg by mouth daily as needed for fluid.     [provider]  gabapentin (NEURONTIN) 300 MG capsule Take 300 mg 2 (two) times daily by mouth. 04/29/17   [provider]  insulin aspart (NOVOLOG FLEXPEN) 100 UNIT/ML FlexPen Inject as per sliding scale subcutaneously before meals and at bedtime 70 - 119 = 0 units 120 - 150 = 2 units 151 - 180 = 4 units 181 - 210 = 5 units Greater than 400  notify MD 211 - 240 = 6 units 241 - 270 = 8 units 271 - 300 = 9 units 301 - 330 = 10 units 331 - 360 = 11 units 361 - 400 = 12 units    [provider]  Insulin Detemir (LEVEMIR FLEXPEN) 100 UNIT/ML Pen Inject 10 Units 2 (two) times daily into the skin.    [provider]  ondansetron (ZOFRAN-ODT) 8 MG disintegrating tablet Take 1 tablet (8 mg total) by mouth every 8 (eight) hours. 04/03/17   Dessa Phi Chahn-Yang, DO  pantoprazole (PROTONIX) 40 MG tablet Take 1 tablet (40 mg total) by mouth daily. 04/03/17   Dessa Phi Chahn-Yang, DO  polyethylene glycol (MIRALAX / GLYCOLAX) packet Take 17 g by mouth daily. 04/11/17   [provider]  promethazine (PHENERGAN) 25 MG suppository Place 1 suppository (25 mg total) rectally every 6 (six) hours as needed for nausea. 04/03/17 05/03/17  Dessa Phi Chahn-Yang, DO  PYRIDOSTIGMINE BROMIDE PO Give 30 mg by mouth daily before meals    [provider]  Sodium Phosphates (FLEET ENEMA RE) Insert 1 unit rectally as needed for constipation  If no results from first enema, may give second enema    [provider]    Family History Family History  Problem Relation Age of Onset  . Cystic fibrosis Mother   . Hypertension Father   . Diabetes Brother   . Hypertension Maternal Grandmother     Social History Social History   Tobacco Use  . Smoking status: Never Smoker  . Smokeless tobacco: Never Used  Substance Use Topics  . Alcohol use: No  . Drug use: No     Allergies   Anesthetics, amide; Penicillins; Buprenorphine hcl; Encainide; and Metoclopramide   Review of Systems Review of Systems  Unable to perform ROS: Acuity of condition  Hyperventilating/hyperperpneic   Physical Exam Updated Vital Signs BP (!) 163/91 (BP Location: Left Arm)   Pulse 98   Temp 98.2 F (36.8 C) (Oral)   Resp 18   Ht 5\' 2"  (1.575 m)   Wt 63.5 kg (140 lb)   LMP 05/01/2017 (Approximate)   SpO2 100%   BMI  25.61 kg/m   Physical Exam  Constitutional: She is oriented to person, place, and time. She appears distressed.  Contrast for rate. Speaking only 1-2 words at a time. Has difficulty with movement of her left jaw. Not deviated.  HENT:  Head: Normocephalic.  Mucous membranes not dried.  Eyes: Conjunctivae are normal. Pupils are equal, round, and reactive to light. No scleral icterus.  Neck: Normal range of motion. Neck supple. No thyromegaly present.  Cardiovascular: Regular rhythm. Exam reveals no gallop and no friction rub.  No murmur heard. Tachycardic. Sinus rhythm 105 on the monitor  Pulmonary/Chest: No respiratory distress. She has no wheezes. She has no rales.  Tachypnea  Abdominal: Soft. Bowel sounds are normal. She exhibits no distension. There is no tenderness. There is no rebound.  Musculoskeletal: Normal range of motion.  Neurological: She is alert and oriented to person, place, and time.  Skin: Skin is warm and dry. No rash noted.  Psychiatric: She has a normal mood and affect. Her behavior is normal.     ED Treatments / Results  Labs (all labs ordered are listed, but only abnormal results are displayed) Labs Reviewed  CBC WITH DIFFERENTIAL/PLATELET - Abnormal; Notable for the following components:      Result Value   WBC 14.5 (*)    RBC 5.40 (*)    MCH 25.7 (*)    RDW 20.2 (*)    Neutro Abs 13.3 (*)    All other components within normal limits  COMPREHENSIVE METABOLIC PANEL - Abnormal; Notable for the following components:   CO2 12 (*)    Glucose, Bld 398 (*)    Creatinine, Ser 1.28 (*)    Calcium 10.6 (*)    Total Protein 10.0 (*)    Total Bilirubin 1.7 (*)    GFR calc non Af Amer 48 (*)    GFR calc Af Amer 56 (*)    Anion gap 24 (*)    All other components within normal limits  URINALYSIS, ROUTINE W REFLEX MICROSCOPIC - Abnormal; Notable for the following components:   APPearance HAZY (*)    Glucose, UA >=500 (*)    Hgb urine dipstick SMALL (*)     Ketones, ur 80 (*)    Protein, ur 100 (*)    Bacteria, UA FEW (*)    Squamous Epithelial / LPF 6-30 (*)    All other components within normal limits  CBG MONITORING, ED - Abnormal; Notable for the following components:   Glucose-Capillary 380 (*)    All other components within normal limits  RAPID URINE DRUG SCREEN, HOSP PERFORMED  ETHANOL  LIPASE, BLOOD  BLOOD GAS, VENOUS    EKG  EKG Interpretation None       Radiology No results found.  Procedures Procedures (including critical care time)  Medications Ordered in ED Medications  LORazepam (ATIVAN) injection 1 mg (1 mg Intravenous Given 05/06/17 0921)  dextrose 5 %-0.45 % sodium chloride infusion ( Intravenous New Bag/Given 05/06/17 1105)  insulin regular (NOVOLIN R,HUMULIN R) 100 Units in sodium chloride 0.9 % 100 mL (1 Units/mL) infusion (1 Units/hr Intravenous New Bag/Given 05/06/17 1102)  sodium chloride 0.9 % bolus 1,000 mL (0 mLs Intravenous Stopped 05/06/17 1046)  0.9 %  sodium chloride infusion ( Intravenous Stopped 05/06/17 1046)  ondansetron (ZOFRAN) 8 mg in sodium chloride 0.9 % 50 mL IVPB (0 mg Intravenous Stopped 05/06/17 1032)  diphenhydrAMINE (BENADRYL) injection 25 mg (25 mg Intravenous Given 05/06/17 0932)  LORazepam (ATIVAN) injection 1 mg (1 mg Intravenous Given 05/06/17 1102)     Initial Impression / Assessment and Plan / ED Course  I have reviewed the triage vital signs and the nursing notes.  Pertinent labs & imaging results that were available during my care of the patient were reviewed by me and considered in my medical decision making (see chart for details).    Tachypneic tachycardic hyperglycemic. Likely DKA. Likely some anxiety as well. Plan benzodiazepines fluids. Probable insulin drip.   Final Clinical Impressions(s) / ED Diagnoses   Final diagnoses:  Diabetic ketoacidosis without coma associated with type 1 diabetes mellitus ([place])    DKA with ECG of 24. Glucose stabilizer insulin  drip  initiated. Given IV fluids. Respiratory rate has improved. Still intermittently hyperventilating and anxious. Continues to mentate well. Discussed with hospitalist. Patient will be admitted to a stepdown bed.  CRITICAL CARE Performed by: Tanna Furry JOSEPH   Total critical care time: 30  minutes  Critical care time was exclusive of separately billable procedures and treating other patients.  Critical care was necessary to treat or prevent imminent or life-threatening deterioration.  Critical care was time spent personally by me on the following activities: development of treatment plan with patient and/or surrogate as well as nursing, discussions with consultants, evaluation of patient's response to treatment, examination of patient, obtaining history from patient or surrogate, ordering and performing treatments and interventions, ordering and review of laboratory studies, ordering and review of radiographic studies, pulse oximetry and re-evaluation of patient's condition.   ED Discharge Orders    None       Tanna Furry, MD 05/06/17 1112

## 2017-05-06 NOTE — H&P (Signed)
History and Physical    [name] ZOX:[id number] DOB: 28-Jul-1965 DOA: 05/06/2017  PCP: [name], MD  Patient coming from: Home  Chief Complaint: Nausea, high sugar  HPI: [name] is a 51 y.o. female with medical history significant of type 1DM with hx of recurrent DKA, HTN, HLD, gastroparesis Who presents to the hospital with complaints of hyperglycemia.  Patient reports increased nausea with vomiting and generalized abdominal pain, worse one day prior to this hospital admission.  Further questioning, patient states that her nausea really started approximately 2 months prior to this hospital admission during which time, patient has been not taking her insulin as she should.  Patient denies any fevers chills.  The patient denies dysuria or recent coughing.  Given worsening nausea and abdominal discomfort, patient subsequent presents to the emergency department for further workup.   ED Course: In the emergency department, she was noted to have a blood glucose of 388 with a serum bicarb of 12 and an anion gap of 24.  Creatinine was noted to be 1.28 with a BUN of 20.  Patient was noted to have a leukocytosis with white blood count of 14.5.  She was afebrile.  Patient was started on insulin drip and IV fluid hydration.  Hospitalist service consulted for consideration for admission.  Review of Systems:  Review of Systems  Constitutional: Positive for malaise/fatigue. Negative for chills and fever.  HENT: Negative for congestion, ear discharge, ear pain and nosebleeds.   Eyes: Positive for pain. Negative for double vision and photophobia.  Respiratory: Negative for hemoptysis, sputum production and shortness of breath.   Cardiovascular: Negative for palpitations, orthopnea and claudication.  Gastrointestinal: Positive for abdominal pain, nausea and vomiting. Negative for blood in stool.  Genitourinary: Negative for frequency, hematuria and urgency.  Musculoskeletal: Negative for back  pain, joint pain and neck pain.  Neurological: Positive for tremors and weakness. Negative for sensory change, speech change, seizures and loss of consciousness.  Psychiatric/Behavioral: Negative for hallucinations and memory loss. The patient is not nervous/anxious.     Past Medical History:  Diagnosis Date  . Depression   . Diabetes mellitus without complication ([place])   . Essential hypertension   . Gastroparesis   . GERD (gastroesophageal reflux disease)   . HLD (hyperlipidemia)     Past Surgical History:  Procedure Laterality Date  . COLONOSCOPY  09/27/2014   at [facility]  . ESOPHAGOGASTRODUODENOSCOPY  09/27/2014   at [facility], Dr Rolan Lipa. biospy neg for celiac, neg for H pylori.   . EYE SURGERY    . gailstones    . IR FLUORO GUIDE CV LINE RIGHT  02/01/2017  . IR FLUORO GUIDE CV LINE RIGHT  03/06/2017  . IR FLUORO GUIDE CV LINE RIGHT  03/25/2017  . IR GENERIC HISTORICAL  01/24/2016   IR FLUORO GUIDE CV LINE RIGHT 01/24/2016 Darrell K Allred, PA-C [facility]  . IR GENERIC HISTORICAL  01/24/2016   IR US GUIDE VASC ACCESS RIGHT 01/24/2016 Darrell K Allred, PA-C [facility]  . IR US GUIDE VASC ACCESS RIGHT  02/01/2017  . IR US GUIDE VASC ACCESS RIGHT  03/06/2017  . IR US GUIDE VASC ACCESS RIGHT  03/25/2017  . POSTERIOR VITRECTOMY AND MEMBRANE PEEL-LEFT EYE  09/28/2002  . POSTERIOR VITRECTOMY AND MEMBRANE PEEL-RIGHT EYE  03/16/2002  . RETINAL DETACHMENT SURGERY       reports that  has never smoked. she has never used smokeless tobacco. She reports that she does not drink alcohol or use drugs.  Allergies  Allergen Reactions  . Anesthetics, Amide Nausea And Vomiting  . Penicillins Diarrhea and Nausea And Vomiting    Has patient had a PCN reaction causing immediate rash, facial/tongue/throat swelling, SOB or lightheadedness with hypotension: No Has patient had a PCN reaction causing severe rash involving mucus membranes or skin necrosis: No Has patient had a PCN reaction that required  hospitalization No Has patient had a PCN reaction occurring within the last 10 years: Yes  If all of the above answers are "NO", then may proceed with Cephalosporin use.   . Buprenorphine Hcl Rash  . Encainide Nausea And Vomiting  . Metoclopramide Other (See Comments)    Dystonia, muscle rigidity.     Family History  Problem Relation Age of Onset  . Cystic fibrosis Mother   . Hypertension Father   . Diabetes Brother   . Hypertension Maternal Grandmother     Prior to Admission medications   Medication Sig Start Date End Date Taking? Authorizing Provider  benztropine (COGENTIN) 1 MG tablet Take 1 tablet (1 mg total) by mouth 2 (two) times daily. 04/03/17   Dessa Phi Chahn-Yang, DO  bisacodyl (DULCOLAX) 10 MG suppository Place 10 mg rectally daily as needed for moderate constipation. 04/16/17   [provider]  clonazePAM (KLONOPIN) 0.5 MG disintegrating tablet Take 1 tablet (0.5 mg total) by mouth 2 (two) times daily. 04/15/17   Gerlene Fee, NP  divalproex (DEPAKOTE SPRINKLE) 125 MG capsule Take 125 mg by mouth 2 (two) times daily. 04/16/17   [provider]  finasteride (PROSCAR) 5 MG tablet Take 1 tablet (5 mg total) by mouth daily. 04/04/17   Dessa Phi Chahn-Yang, DO  furosemide (LASIX) 40 MG tablet Take 40 mg by mouth daily as needed for fluid.     [provider]  gabapentin (NEURONTIN) 300 MG capsule Take 300 mg 2 (two) times daily by mouth. 04/29/17   [provider]  insulin aspart (NOVOLOG FLEXPEN) 100 UNIT/ML FlexPen Inject as per sliding scale subcutaneously before meals and at bedtime 70 - 119 = 0 units 120 - 150 = 2 units 151 - 180 = 4 units 181 - 210 = 5 units Greater than 400 notify MD 211 - 240 = 6 units 241 - 270 = 8 units 271 - 300 = 9 units 301 - 330 = 10 units 331 - 360 = 11 units 361 - 400 = 12 units    [provider]  Insulin Detemir (LEVEMIR FLEXPEN) 100 UNIT/ML Pen Inject 10 Units 2 (two) times  daily into the skin.    [provider]  ondansetron (ZOFRAN-ODT) 8 MG disintegrating tablet Take 1 tablet (8 mg total) by mouth every 8 (eight) hours. 04/03/17   Dessa Phi Chahn-Yang, DO  pantoprazole (PROTONIX) 40 MG tablet Take 1 tablet (40 mg total) by mouth daily. 04/03/17   Dessa Phi Chahn-Yang, DO  polyethylene glycol (MIRALAX / GLYCOLAX) packet Take 17 g by mouth daily. 04/11/17   [provider]  promethazine (PHENERGAN) 25 MG suppository Place 1 suppository (25 mg total) rectally every 6 (six) hours as needed for nausea. 04/03/17 05/03/17  Dessa Phi Chahn-Yang, DO  PYRIDOSTIGMINE BROMIDE PO Give 30 mg by mouth daily before meals    [provider]  Sodium Phosphates (FLEET ENEMA RE) Insert 1 unit rectally as needed for constipation  If no results from first enema, may give second enema    [provider]    Physical Exam: Vitals:   05/06/17 5188  05/06/17 0836 05/06/17 1056  BP: (!) 156/93  (!) 163/91  Pulse: 98  98  Resp: 20  18  Temp: 98.2 F (36.8 C)    TempSrc: Oral    SpO2: 100%  100%  Weight:  63.5 kg (140 lb)   Height:  5\' 2"  (1.575 m)     Constitutional: NAD, calm, comfortable Vitals:   05/06/17 0835 05/06/17 0836 05/06/17 1056  BP: (!) 156/93  (!) 163/91  Pulse: 98  98  Resp: 20  18  Temp: 98.2 F (36.8 C)    TempSrc: Oral    SpO2: 100%  100%  Weight:  63.5 kg (140 lb)   Height:  5\' 2"  (1.575 m)    Eyes: PERRL, lids and conjunctivae normal ENMT: His membranes dry. Posterior pharynx clear of any exudate or lesions.Normal dentition.  Neck: normal, supple, no masses, no thyromegaly Respiratory: clear to auscultation bilaterally, no wheezing, no crackles. Normal respiratory effort. No accessory muscle use.  Cardiovascular: Regular rate and rhythm, no murmurs / rubs / gallops. No extremity edema. 2+ pedal pulses. No carotid bruits.  Abdomen: no tenderness, no masses palpated. No hepatosplenomegaly. Bowel sounds  positive.  Musculoskeletal: no clubbing / cyanosis. No joint deformity upper and lower extremities. Good ROM, no contractures. Normal muscle tone.  Skin: no rashes, lesions, ulcers. No induration Neurologic: CN 2-12 grossly intact. Sensation intact, active tar dive dyskinesia present on exam Psychiatric: Normal judgment and insight. Alert and oriented x 3. Normal mood.    Labs on Admission: I have personally reviewed following labs and imaging studies  CBC: Recent Labs  Lab 05/06/17 0930  WBC 14.5*  NEUTROABS 13.3*  HGB 13.9  HCT 42.9  MCV 79.4  PLT 220   Basic Metabolic Panel: Recent Labs  Lab 05/06/17 0930  NA 137  K 4.3  CL 101  CO2 12*  GLUCOSE 398*  BUN 20  CREATININE 1.28*  CALCIUM 10.6*   GFR: Estimated Creatinine Clearance: 46.6 mL/min (A) (by C-G formula based on SCr of 1.28 mg/dL (H)). Liver Function Tests: Recent Labs  Lab 05/06/17 0930  AST 31  ALT 16  ALKPHOS 122  BILITOT 1.7*  PROT 10.0*  ALBUMIN 4.6   Recent Labs  Lab 05/06/17 0930  LIPASE 16   No results for input(s): AMMONIA in the last 168 hours. Coagulation Profile: No results for input(s): INR, PROTIME in the last 168 hours. Cardiac Enzymes: No results for input(s): CKTOTAL, CKMB, CKMBINDEX, TROPONINI in the last 168 hours. BNP (last 3 results) No results for input(s): PROBNP in the last 8760 hours. HbA1C: No results for input(s): HGBA1C in the last 72 hours. CBG: Recent Labs  Lab 05/06/17 0859  GLUCAP 380*   Lipid Profile: No results for input(s): CHOL, HDL, LDLCALC, TRIG, CHOLHDL, LDLDIRECT in the last 72 hours. Thyroid Function Tests: No results for input(s): TSH, T4TOTAL, FREET4, T3FREE, THYROIDAB in the last 72 hours. Anemia Panel: No results for input(s): VITAMINB12, FOLATE, FERRITIN, TIBC, IRON, RETICCTPCT in the last 72 hours. Urine analysis:    Component Value Date/Time   COLORURINE YELLOW 05/06/2017 0924   APPEARANCEUR HAZY (A) 05/06/2017 0924   LABSPEC 1.022  05/06/2017 0924   PHURINE 6.0 05/06/2017 0924   GLUCOSEU >=500 (A) 05/06/2017 0924   HGBUR SMALL (A) 05/06/2017 0924   BILIRUBINUR NEGATIVE 05/06/2017 0924   BILIRUBINUR neg 01/17/2017 1533   KETONESUR 80 (A) 05/06/2017 0924   PROTEINUR 100 (A) 05/06/2017 0924   UROBILINOGEN 0.2 01/17/2017 1533   UROBILINOGEN 0.2  04/27/2015 1608   NITRITE NEGATIVE 05/06/2017 0924   LEUKOCYTESUR NEGATIVE 05/06/2017 0924   Sepsis Labs: !!!!!!!!!!!!!!!!!!!!!!!!!!!!!!!!!!!!!!!!!!!! @LABRCNTIP (procalcitonin:4,lacticidven:4) )No results found for this or any previous visit (from the past 240 hour(s)).   Radiological Exams on Admission: No results found.  abd xray from 10/6 personally reviewed. Feeding tube was placed, otherwise unremarkable   Assessment/Plan Active Problems:   Diabetic neuropathy, type I diabetes mellitus (HCC)   Diabetic gastroparesis ([place])   DKA, type 1, not at goal [facility])   HLD (hyperlipidemia)   Essential hypertension   Noncompliance with treatment plan   DKA, type 1 ([place])   1. DKA with type 1 DM 1. Presenting glucose in excess of 500 with elevated anion gap and metabolic acidosis 2. Insulin gtt started in ED, will continue 3.  Will continue IVF hydration as tolerated 4. Admit to stepdown 2. HLD 1. Appears stable. 2. Cont home regimen as tolerated 3. HTN 1. BP currently stable 2. Cont home medication as tolerated 4. Noncompliance with treatment 1. Counseling done at bedside 2. On further speaking with patient, there are concerns for social barriers which limit patient's compliance with following up as an outpatient. 3. We will place a consult for case manager for assistance 5. Metabolic acidosis 1. Secondary to presenting DKA 2. Will repeat bmet in AM 3. Continue IVF hydration 6. Leukocytosis 1. Afebrile 2. Suspect reactive leukocytosis vs dehydration secondary to DKA 3. repeat cbc in AM 7. Tardive dyskinesia 1. Stable 2. Cont home meds   DVT prophylaxis:  levenox  Code Status: Full Family Communication: Pt in room  Disposition Plan: uncertain at this time  Consults called:  Admission status: inpatient as would likely require greater than 2 midnight stay to resolve DKA   Jubal Rademaker, Orpah Melter MD [facility] Pager [phone]  If 7PM-7AM, please contact night-coverage [email or web address] Password [email or web address]  05/06/2017, 11:13 AM

## 2017-05-06 NOTE — Care Management Note (Signed)
Case Management Note  Patient Details  Name: [name] MRN: [id number] Date of Birth: [birth year]   Expected Discharge Date:  (UNKNOWN)               Expected Discharge Plan:  [place]  Post Acute Care Choice:  Home Health Choice offered to:  Patient  HH Arranged:  RN, PT [facility] Agency:  [place])  Status of Service:  In process, will continue to follow  Rae Mar, RN 05/06/2017, 11:38 AM

## 2017-05-06 NOTE — ED Notes (Signed)
Bed: YM41 Expected date: 05/06/17 Expected time: 8:21 AM Means of arrival: Ambulance Comments: EMS- 10s F, hyperglycemia

## 2017-05-07 LAB — COMPREHENSIVE METABOLIC PANEL
ALBUMIN: 3.3 g/dL — AB (ref 3.5–5.0)
ALT: 7 U/L — ABNORMAL LOW (ref 14–54)
ANION GAP: 8 (ref 5–15)
AST: 18 U/L (ref 15–41)
Alkaline Phosphatase: 80 U/L (ref 38–126)
BUN: 27 mg/dL — AB (ref 6–20)
CHLORIDE: 112 mmol/L — AB (ref 101–111)
CO2: 18 mmol/L — AB (ref 22–32)
Calcium: 9.2 mg/dL (ref 8.9–10.3)
Creatinine, Ser: 2.09 mg/dL — ABNORMAL HIGH (ref 0.44–1.00)
GFR calc Af Amer: 31 mL/min — ABNORMAL LOW (ref >60)
GFR calc non Af Amer: 27 mL/min — ABNORMAL LOW (ref >60)
GLUCOSE: 128 mg/dL — AB (ref 65–99)
POTASSIUM: 3.9 mmol/L (ref 3.5–5.1)
SODIUM: 138 mmol/L (ref 135–145)
Total Bilirubin: 0.5 mg/dL (ref 0.3–1.2)
Total Protein: 7.2 g/dL (ref 6.5–8.1)

## 2017-05-07 LAB — CBC
HEMATOCRIT: 37.9 % (ref 36.0–46.0)
Hemoglobin: 11.9 g/dL — ABNORMAL LOW (ref 12.0–15.0)
MCH: 25.3 pg — ABNORMAL LOW (ref 26.0–34.0)
MCHC: 31.4 g/dL (ref 30.0–36.0)
MCV: 80.6 fL (ref 78.0–100.0)
Platelets: 288 10*3/uL (ref 150–400)
RBC: 4.7 MIL/uL (ref 3.87–5.11)
RDW: 21.1 % — AB (ref 11.5–15.5)
WBC: 12.5 10*3/uL — ABNORMAL HIGH (ref 4.0–10.5)

## 2017-05-07 LAB — GLUCOSE, CAPILLARY
GLUCOSE-CAPILLARY: 199 mg/dL — AB (ref 65–99)
GLUCOSE-CAPILLARY: 378 mg/dL — AB (ref 65–99)
Glucose-Capillary: 122 mg/dL — ABNORMAL HIGH (ref 65–99)
Glucose-Capillary: 140 mg/dL — ABNORMAL HIGH (ref 65–99)
Glucose-Capillary: 178 mg/dL — ABNORMAL HIGH (ref 65–99)
Glucose-Capillary: 194 mg/dL — ABNORMAL HIGH (ref 65–99)
Glucose-Capillary: 202 mg/dL — ABNORMAL HIGH (ref 65–99)
Glucose-Capillary: 342 mg/dL — ABNORMAL HIGH (ref 65–99)
Glucose-Capillary: 79 mg/dL (ref 65–99)
Glucose-Capillary: 90 mg/dL (ref 65–99)
Glucose-Capillary: 93 mg/dL (ref 65–99)
Glucose-Capillary: 95 mg/dL (ref 65–99)

## 2017-05-07 MED ORDER — INSULIN ASPART 100 UNIT/ML ~~LOC~~ SOLN
0.0000 [IU] | Freq: Three times a day (TID) | SUBCUTANEOUS | Status: DC
  Administered 2017-05-07: 7 [IU] via SUBCUTANEOUS
  Administered 2017-05-08: 2 [IU] via SUBCUTANEOUS
  Administered 2017-05-09: 3 [IU] via SUBCUTANEOUS
  Administered 2017-05-09: 1 [IU] via SUBCUTANEOUS

## 2017-05-07 MED ORDER — INSULIN ASPART 100 UNIT/ML ~~LOC~~ SOLN
3.0000 [IU] | Freq: Three times a day (TID) | SUBCUTANEOUS | Status: DC
  Administered 2017-05-07 – 2017-05-09 (×6): 3 [IU] via SUBCUTANEOUS

## 2017-05-07 MED ORDER — SODIUM CHLORIDE 0.9 % IV SOLN
Freq: Once | INTRAVENOUS | Status: AC
  Administered 2017-05-07: 11:00:00 via INTRAVENOUS

## 2017-05-07 MED ORDER — POLYETHYLENE GLYCOL 3350 17 G PO PACK
17.0000 g | PACK | Freq: Every day | ORAL | Status: DC
  Administered 2017-05-09: 17 g via ORAL
  Filled 2017-05-07 (×3): qty 1

## 2017-05-07 MED ORDER — INSULIN DETEMIR 100 UNIT/ML ~~LOC~~ SOLN
10.0000 [IU] | Freq: Two times a day (BID) | SUBCUTANEOUS | Status: DC
  Administered 2017-05-07 (×2): 10 [IU] via SUBCUTANEOUS
  Filled 2017-05-07 (×3): qty 0.1

## 2017-05-07 MED ORDER — SODIUM CHLORIDE 0.9 % IV BOLUS (SEPSIS)
2000.0000 mL | Freq: Once | INTRAVENOUS | Status: AC
  Administered 2017-05-07: 2000 mL via INTRAVENOUS

## 2017-05-07 MED ORDER — SODIUM CHLORIDE 0.9 % IV BOLUS (SEPSIS)
500.0000 mL | Freq: Once | INTRAVENOUS | Status: AC
  Administered 2017-05-07: 500 mL via INTRAVENOUS

## 2017-05-07 MED ORDER — INSULIN ASPART 100 UNIT/ML ~~LOC~~ SOLN
0.0000 [IU] | Freq: Every day | SUBCUTANEOUS | Status: DC
  Administered 2017-05-07: 5 [IU] via SUBCUTANEOUS
  Administered 2017-05-08: 0 [IU] via SUBCUTANEOUS

## 2017-05-07 MED ORDER — ONDANSETRON 4 MG PO TBDP
8.0000 mg | ORAL_TABLET | Freq: Three times a day (TID) | ORAL | Status: DC
  Administered 2017-05-07 – 2017-05-09 (×7): 8 mg via ORAL
  Filled 2017-05-07 (×7): qty 2

## 2017-05-07 MED ORDER — BUSPIRONE HCL 5 MG PO TABS
5.0000 mg | ORAL_TABLET | Freq: Three times a day (TID) | ORAL | Status: DC
  Administered 2017-05-07 – 2017-05-09 (×8): 5 mg via ORAL
  Filled 2017-05-07 (×8): qty 1

## 2017-05-07 MED ORDER — ENOXAPARIN SODIUM 30 MG/0.3ML ~~LOC~~ SOLN
30.0000 mg | SUBCUTANEOUS | Status: DC
  Administered 2017-05-07: 30 mg via SUBCUTANEOUS
  Filled 2017-05-07: qty 0.3

## 2017-05-07 NOTE — Plan of Care (Signed)
  Progressing Education: Knowledge of General Education information will improve 05/07/2017 1446 - Progressing by Staci Righter, RN Health Behavior/Discharge Planning: Ability to manage health-related needs will improve 05/07/2017 1446 - Progressing by Staci Righter, RN Clinical Measurements: Ability to maintain clinical measurements within normal limits will improve 05/07/2017 1446 - Progressing by Staci Righter, RN Will remain free from infection 05/07/2017 1446 - Progressing by Staci Righter, RN Nutrition: Adequate nutrition will be maintained 05/07/2017 1446 - Progressing by Staci Righter, RN

## 2017-05-07 NOTE — Progress Notes (Addendum)
Inpatient Diabetes Program Recommendations  AACE/ADA: New Consensus Statement on Inpatient Glycemic Control (2015)  Target Ranges:  Prepandial:   less than 140 mg/dL      Peak postprandial:   less than 180 mg/dL (1-2 hours)      Critically ill patients:  140 - 180 mg/dL   Results for CHELLE, CAYTON (MRN [id number]) as of 05/07/2017 07:51  Ref. Range 05/07/2017 03:49  Sodium Latest Ref Range: 135 - 145 mmol/L 138  Potassium Latest Ref Range: 3.5 - 5.1 mmol/L 3.9  Chloride Latest Ref Range: 101 - 111 mmol/L 112 (H)  CO2 Latest Ref Range: 22 - 32 mmol/L 18 (L)  Glucose Latest Ref Range: 65 - 99 mg/dL 128 (H)  BUN Latest Ref Range: 6 - 20 mg/dL 27 (H)  Creatinine Latest Ref Range: 0.44 - 1.00 mg/dL 2.09 (H)  Calcium Latest Ref Range: 8.9 - 10.3 mg/dL 9.2  Anion gap Latest Ref Range: 5 - 15  8    Home DM Meds: Levemir 10 units BID       Novolog SSI  Current Insulin Orders: IV Insulin drip       MD- Note Anion Gap improved this AM but CO2 still slightly low at 18.  When patient is ready to transition to SQ Insulin, please make sure she receives Levemir at least 1-2 hours prior to d/c of IV Insulin drip.  Patient well known to the Inpatient Diabetes Program.  This is patient's 7th admission since January of 2018.      --Will follow patient during hospitalization--  Wyn Quaker RN, MSN, CDE Diabetes Coordinator Inpatient Glycemic Control Team Team Pager: [phone] (8a-5p)

## 2017-05-07 NOTE — Progress Notes (Signed)
Patient ID: [name], female   DOB: 01-01-1966, 51 y.o.   MRN: [id number]    PROGRESS NOTE  [name]  WYO:[id number] DOB: May 22, 1966 DOA: 05/06/2017  PCP: [name], MD   Brief Narrative:  Pt is 51 yo female with known DM type I, recurrent DKA, HTN, HLD, presented with hyperglycemia, N/V, poor oral intake, abdominal pain. In ED, blood work consistent with DKA and TRH asked to admit for further evaluation.   Assessment & Plan:   1. DKA with type 1 DM 1. Presenting glucose in excess of 500 with elevated anion gap and metabolic acidosis 2. Insulin gtt started in ED, AG closed, stop insulin drip and transition to insulin per home regimen  3. Continue IVF 4. Advance diet, repeat BMP in AM 2. HLD 1. Appears stable. 2. Continue home regimen  3. HTN 1. BP low, hold antihypertensive regimen 2. Keep on IVF 4. Noncompliance with treatment 1. Counseling provided this AM 2. Case manger consulted for assistance  5. Metabolic acidosis 1. Secondary to presenting DKA 2. Improving with IVF  6. Acute renal injury 1. Suspect pre renal etiology 2. Avoid nephrotoxic meds 3. Continue IVF 4. BMP in AM 7. Leukocytosis  1. Afebrile 2. Suspect reactive leukocytosis vs dehydration secondary to DKA 3. WBC trending down  8. Tardive dyskinesia 1. Stable 2. Cont home meds  DVT prophylaxis: Lovenox SQ Code Status: Full  Family Communication: Patient at bedside  Disposition Plan: to be determined   Consultants:   None  Procedures:   None  Antimicrobials:   None  Subjective: Pt reports feeling better, wants to eat more.   Objective: Vitals:   05/07/17 0700 05/07/17 0800 05/07/17 1200 05/07/17 1400  BP: (!) 81/33 (!) 105/49 (!) 90/45 (!) 79/39  Pulse: 74 78 76 72  Resp: 18 15 15 15   Temp:  98.2 F (36.8 C) 98.2 F (36.8 C)   TempSrc:  Axillary Oral   SpO2: 100% 100% 99% 97%  Weight:      Height:        Intake/Output Summary (Last 24 hours) at 05/07/2017  1632 Last data filed at 05/07/2017 1300 Gross per 24 hour  Intake 1617.01 ml  Output 525 ml  Net 1092.01 ml   Filed Weights   05/06/17 0836  Weight: 63.5 kg (140 lb)    Examination:  General exam: Appears calm and comfortable  Respiratory system: Clear to auscultation. Respiratory effort normal. Cardiovascular system: S1 & S2 heard, RRR. No JVD, murmurs, rubs, gallops or clicks. No pedal edema. Gastrointestinal system: Abdomen is nondistended, soft and nontender. No organomegaly or masses felt. Normal bowel sounds heard. Central nervous system: Alert and oriented. No focal neurological deficits. Extremities: Symmetric 5 x 5 power. Skin: No rashes, lesions or ulcers Psychiatry: Judgement and insight appear normal. Mood & affect appropriate.    Data Reviewed: I have personally reviewed following labs and imaging studies  CBC: Recent Labs  Lab 05/06/17 0930 05/07/17 0349  WBC 14.5* 12.5*  NEUTROABS 13.3*  --   HGB 13.9 11.9*  HCT 42.9 37.9  MCV 79.4 80.6  PLT 356 774   Basic Metabolic Panel: Recent Labs  Lab 05/06/17 0930 05/06/17 1355 05/06/17 1804 05/06/17 2156 05/07/17 0349  NA 137 137 138 138 138  K 4.3 5.9* 4.0 4.2 3.9  CL 101 106 112* 112* 112*  CO2 12* 12* 17* 17* 18*  GLUCOSE 398* 341* 153* 116* 128*  BUN 20 22* 23* 24* 27*  CREATININE 1.28* 1.30* 1.49*  1.72* 2.09*  CALCIUM 10.6* 9.7 9.4 9.3 9.2   Liver Function Tests: Recent Labs  Lab 05/06/17 0930 05/07/17 0349  AST 31 18  ALT 16 7*  ALKPHOS 122 80  BILITOT 1.7* 0.5  PROT 10.0* 7.2  ALBUMIN 4.6 3.3*   Recent Labs  Lab 05/06/17 0930  LIPASE 16   CBG: Recent Labs  Lab 05/07/17 0701 05/07/17 0746 05/07/17 0917 05/07/17 1124 05/07/17 1605  GLUCAP 95 122* 199* 342* 90   Urine analysis:    Component Value Date/Time   COLORURINE YELLOW 05/06/2017 0924   APPEARANCEUR HAZY (A) 05/06/2017 0924   LABSPEC 1.022 05/06/2017 0924   PHURINE 6.0 05/06/2017 0924   GLUCOSEU >=500 (A)  05/06/2017 0924   HGBUR SMALL (A) 05/06/2017 0924   BILIRUBINUR NEGATIVE 05/06/2017 0924   BILIRUBINUR neg 01/17/2017 1533   KETONESUR 80 (A) 05/06/2017 0924   PROTEINUR 100 (A) 05/06/2017 0924   UROBILINOGEN 0.2 01/17/2017 1533   UROBILINOGEN 0.2 04/27/2015 1608   NITRITE NEGATIVE 05/06/2017 0924   LEUKOCYTESUR NEGATIVE 05/06/2017 0924   Recent Results (from the past 240 hour(s))  MRSA PCR Screening     Status: None   Collection Time: 05/06/17 12:48 PM  Result Value Ref Range Status   MRSA by PCR NEGATIVE NEGATIVE Final    Comment:        The GeneXpert MRSA Assay (FDA approved for NASAL specimens only), is one component of a comprehensive MRSA colonization surveillance program. It is not intended to diagnose MRSA infection nor to guide or monitor treatment for MRSA infections.     Radiology Studies: No results found.  Scheduled Meds: . benztropine  1 mg Oral BID  . busPIRone  5 mg Oral TID  . clonazePAM  0.5 mg Oral BID  . divalproex  125 mg Oral BID  . enoxaparin (LOVENOX) injection  30 mg Subcutaneous Q24H  . finasteride  5 mg Oral Daily  . insulin aspart  0-5 Units Subcutaneous QHS  . insulin aspart  0-9 Units Subcutaneous TID WC  . insulin aspart  3 Units Subcutaneous TID WC  . insulin detemir  10 Units Subcutaneous BID  . ondansetron  8 mg Oral Q8H  . pantoprazole  40 mg Oral Daily  . polyethylene glycol  17 g Oral Daily  . pregabalin  150 mg Oral BID  . pyridostigmine  30 mg Oral TID AC   Continuous Infusions: . sodium chloride 500 mL (05/07/17 1437)     LOS: 1 day   \ Time spent: 25 minutes   Faye Ramsay, MD [facility] Pager [phone]  If 7PM-7AM, please contact night-coverage [email or web address] Password [email or web address] 05/07/2017, 4:32 PM

## 2017-05-08 DIAGNOSIS — F32A Depression, unspecified: Secondary | ICD-10-CM

## 2017-05-08 DIAGNOSIS — E785 Hyperlipidemia, unspecified: Secondary | ICD-10-CM

## 2017-05-08 DIAGNOSIS — E1143 Type 2 diabetes mellitus with diabetic autonomic (poly)neuropathy: Secondary | ICD-10-CM

## 2017-05-08 DIAGNOSIS — Z9111 Patient's noncompliance with dietary regimen: Secondary | ICD-10-CM

## 2017-05-08 DIAGNOSIS — K3184 Gastroparesis: Secondary | ICD-10-CM

## 2017-05-08 DIAGNOSIS — F329 Major depressive disorder, single episode, unspecified: Secondary | ICD-10-CM

## 2017-05-08 DIAGNOSIS — I10 Essential (primary) hypertension: Secondary | ICD-10-CM

## 2017-05-08 DIAGNOSIS — E1010 Type 1 diabetes mellitus with ketoacidosis without coma: Principal | ICD-10-CM

## 2017-05-08 LAB — BASIC METABOLIC PANEL
Anion gap: 5 (ref 5–15)
BUN: 16 mg/dL (ref 6–20)
CALCIUM: 8.1 mg/dL — AB (ref 8.9–10.3)
CHLORIDE: 114 mmol/L — AB (ref 101–111)
CO2: 16 mmol/L — AB (ref 22–32)
CREATININE: 1.05 mg/dL — AB (ref 0.44–1.00)
GFR calc Af Amer: >60 mL/min (ref >60)
GFR calc non Af Amer: >60 mL/min (ref >60)
GLUCOSE: 78 mg/dL (ref 65–99)
Potassium: 5 mmol/L (ref 3.5–5.1)
Sodium: 135 mmol/L (ref 135–145)

## 2017-05-08 LAB — GLUCOSE, CAPILLARY
GLUCOSE-CAPILLARY: 116 mg/dL — AB (ref 65–99)
GLUCOSE-CAPILLARY: 159 mg/dL — AB (ref 65–99)
GLUCOSE-CAPILLARY: 199 mg/dL — AB (ref 65–99)
Glucose-Capillary: 87 mg/dL (ref 65–99)
Glucose-Capillary: 157 mg/dL — ABNORMAL HIGH (ref 65–99)

## 2017-05-08 MED ORDER — ENOXAPARIN SODIUM 40 MG/0.4ML ~~LOC~~ SOLN
40.0000 mg | SUBCUTANEOUS | Status: DC
  Administered 2017-05-08: 40 mg via SUBCUTANEOUS
  Filled 2017-05-08: qty 0.4

## 2017-05-08 MED ORDER — INSULIN DETEMIR 100 UNIT/ML ~~LOC~~ SOLN
12.0000 [IU] | Freq: Two times a day (BID) | SUBCUTANEOUS | Status: DC
  Administered 2017-05-08 – 2017-05-09 (×3): 12 [IU] via SUBCUTANEOUS
  Filled 2017-05-08 (×4): qty 0.12

## 2017-05-08 MED ORDER — SODIUM BICARBONATE 650 MG PO TABS
650.0000 mg | ORAL_TABLET | Freq: Every day | ORAL | Status: DC
  Administered 2017-05-08 – 2017-05-09 (×2): 650 mg via ORAL
  Filled 2017-05-08 (×2): qty 1

## 2017-05-08 MED ORDER — SODIUM CHLORIDE 0.9 % IV SOLN
INTRAVENOUS | Status: DC
  Administered 2017-05-08 – 2017-05-09 (×2): via INTRAVENOUS

## 2017-05-08 NOTE — Progress Notes (Signed)
[facility] PROGRESS NOTE  [name] XAJ:[id number] DOB: 12/20/1965 DOA: 05/06/2017 PCP: Yvette Morale, MD  Interim summary and HPI  51 year old female with known type 1 diabetes mellitus, recurrent DKA's (currently coursing her seventh admission in the last 6 months) hypertension, hyperlipidemia and history of gastroparesis; who presented with elevated blood sugar, nausea/vomiting, poor oral intake and abdominal pain.  In the ED patient was found to have DKA and a [facility] hospitalist has been called to admit the patient for further management and treatment.  Assessment/Plan: 1-Type I DKA without coma -Resolved -Patient transition to long-acting insulin and sliding scale -Tolerating diet -Will adjust long-acting insulin and follow CBGs.  2-hyperlipidemia -Continue statins  3-hypertension -Blood pressure has been low -Continue IV fluids -Continue holding antihypertensive agents.  4-noncompliance with treatment -Patient educated about her condition and the need for compliance -She has presented due to the gastroparesis sometimes difficult to eat and at the same time and be able to continue using insulin therapy as instructed  5-acute renal injury/metabolic acidosis -Improved with IV fluids -Continue following renal function -Started on bicarb  6-depression/anxiety/tardive dyskinesia -No suicidal ideation or hallucination -Stable overall  -Continue Depakote, Klonopin, BuSpar, Cogentin -Continue pyridostigmine  7-hyperkalemia -K5.0  8-gastroparesis -Will continue controlling her diabetes -Will recommend erythromycin orally as an outpatient -Continue PPI.   Code Status: Full code Family Communication: No family bedside Disposition Plan: We will transferred out of the stepdown, continue adjusting patient's insulin therapy for better control of her CBGs and follow blood pressure/vital signs after IV fluids.  Hopefully discharge  tomorrow.   Consultants:  None  Procedures:  See below for x-ray reports  Antibiotics:  None  HPI/Subjective: Patient denies chest pain, no shortness of breath, no vomiting.  Still feeling nauseous  Objective: Vitals:   05/08/17 0400 05/08/17 0500  BP: (!) 104/48 (!) 107/47  Pulse: 69 69  Resp: 18 (!) 29  Temp: 98.2 F (36.8 C)   SpO2: 98% 99%    Intake/Output Summary (Last 24 hours) at 05/08/2017 0839 Last data filed at 05/07/2017 1700 Gross per 24 hour  Intake 615 ml  Output -  Net 615 ml   Filed Weights   05/06/17 0836  Weight: 63.5 kg (140 lb)    Exam:  General exam:  Appears calm and comfortable, still nauseated but reports no further active vomiting.  She is tolerating diet.   Respiratory system: Good air movement, normal respiratory effort, no wheezing, no crackles. Cardiovascular system:  S1 and S2, regular rate and rhythm, no JVD, no rubs, no gallops, no murmurs. Gastrointestinal system:  Soft, nondistended, no tenderness; positive BS. Central nervous system:  Alert, awake and oriented x3.  No focal neurologic motor deficit appreciated. Extremities: No cyanosis, no clubbing Psychiatry: Normal judgment, no suicidal ideation, good insight.   Data Reviewed: Basic Metabolic Panel: Recent Labs  Lab 05/06/17 1355 05/06/17 1804 05/06/17 2156 05/07/17 0349 05/08/17 1431  NA 137 138 138 138 135  K 5.9* 4.0 4.2 3.9 5.0  CL 106 112* 112* 112* 114*  CO2 12* 17* 17* 18* 16*  GLUCOSE 341* 153* 116* 128* 78  BUN 22* 23* 24* 27* 16  CREATININE 1.30* 1.49* 1.72* 2.09* 1.05*  CALCIUM 9.7 9.4 9.3 9.2 8.1*   Liver Function Tests: Recent Labs  Lab 05/06/17 0930 05/07/17 0349  AST 31 18  ALT 16 7*  ALKPHOS 122 80  BILITOT 1.7* 0.5  PROT 10.0* 7.2  ALBUMIN 4.6 3.3*   Recent Labs  Lab 05/06/17 0930  LIPASE  16   No results for input(s): AMMONIA in the last 168 hours. CBC: Recent Labs  Lab 05/06/17 0930 05/07/17 0349  WBC 14.5* 12.5*   NEUTROABS 13.3*  --   HGB 13.9 11.9*  HCT 42.9 37.9  MCV 79.4 80.6  PLT 356 288   CBG: Recent Labs  Lab 05/07/17 0917 05/07/17 1124 05/07/17 1605 05/07/17 2105 05/08/17 0747  GLUCAP 199* 342* 90 378* 199*    Recent Results (from the past 240 hour(s))  MRSA PCR Screening     Status: None   Collection Time: 05/06/17 12:48 PM  Result Value Ref Range Status   MRSA by PCR NEGATIVE NEGATIVE Final    Comment:        The GeneXpert MRSA Assay (FDA approved for NASAL specimens only), is one component of a comprehensive MRSA colonization surveillance program. It is not intended to diagnose MRSA infection nor to guide or monitor treatment for MRSA infections.      Studies: No results found.  Scheduled Meds: . benztropine  1 mg Oral BID  . busPIRone  5 mg Oral TID  . clonazePAM  0.5 mg Oral BID  . divalproex  125 mg Oral BID  . enoxaparin (LOVENOX) injection  30 mg Subcutaneous Q24H  . finasteride  5 mg Oral Daily  . insulin aspart  0-5 Units Subcutaneous QHS  . insulin aspart  0-9 Units Subcutaneous TID WC  . insulin aspart  3 Units Subcutaneous TID WC  . insulin detemir  12 Units Subcutaneous BID  . ondansetron  8 mg Oral Q8H  . pantoprazole  40 mg Oral Daily  . polyethylene glycol  17 g Oral Daily  . pregabalin  150 mg Oral BID  . pyridostigmine  30 mg Oral TID AC   Continuous Infusions: . sodium chloride 75 mL/hr at 05/08/17 0800    Active Problems:   Diabetic neuropathy, type I diabetes mellitus ([place])   Diabetic gastroparesis ([place])   DKA, type 1, not at goal [facility])   HLD (hyperlipidemia)   Essential hypertension   Noncompliance with treatment plan   DKA, type 1 ([place])   DKA (diabetic ketoacidoses) ([place])   Depression    Time spent: 30 minutes    [name]  [facility] Pager [phone]. If 7PM-7AM, please contact night-coverage at [email or web address], password [facility] 05/08/2017, 8:39 AM  LOS: 2 days

## 2017-05-09 ENCOUNTER — Telehealth: Payer: Self-pay

## 2017-05-09 DIAGNOSIS — K219 Gastro-esophageal reflux disease without esophagitis: Secondary | ICD-10-CM

## 2017-05-09 DIAGNOSIS — E1040 Type 1 diabetes mellitus with diabetic neuropathy, unspecified: Secondary | ICD-10-CM

## 2017-05-09 LAB — URINALYSIS, ROUTINE W REFLEX MICROSCOPIC
BILIRUBIN URINE: NEGATIVE
Glucose, UA: NEGATIVE mg/dL
Ketones, ur: NEGATIVE mg/dL
Nitrite: NEGATIVE
PH: 6 (ref 5.0–8.0)
Protein, ur: NEGATIVE mg/dL
SPECIFIC GRAVITY, URINE: 1.01 (ref 1.005–1.030)

## 2017-05-09 LAB — BASIC METABOLIC PANEL
Anion gap: 4 — ABNORMAL LOW (ref 5–15)
BUN: 14 mg/dL (ref 6–20)
CALCIUM: 8.5 mg/dL — AB (ref 8.9–10.3)
CO2: 19 mmol/L — ABNORMAL LOW (ref 22–32)
CREATININE: 0.98 mg/dL (ref 0.44–1.00)
Chloride: 117 mmol/L — ABNORMAL HIGH (ref 101–111)
GFR calc Af Amer: >60 mL/min (ref >60)
Glucose, Bld: 145 mg/dL — ABNORMAL HIGH (ref 65–99)
Potassium: 4.1 mmol/L (ref 3.5–5.1)
SODIUM: 140 mmol/L (ref 135–145)

## 2017-05-09 LAB — GLUCOSE, CAPILLARY
GLUCOSE-CAPILLARY: 141 mg/dL — AB (ref 65–99)
GLUCOSE-CAPILLARY: 222 mg/dL — AB (ref 65–99)
GLUCOSE-CAPILLARY: 98 mg/dL (ref 65–99)
Glucose-Capillary: 138 mg/dL — ABNORMAL HIGH (ref 65–99)

## 2017-05-09 MED ORDER — PANTOPRAZOLE SODIUM 40 MG PO TBEC: 40.0000 mg | DELAYED_RELEASE_TABLET | Freq: Two times a day (BID) | ORAL | 0 refills | Status: DC

## 2017-05-09 MED ORDER — SODIUM BICARBONATE 650 MG PO TABS: 650.0000 mg | ORAL_TABLET | Freq: Every day | ORAL | 0 refills | Status: DC

## 2017-05-09 MED ORDER — ONDANSETRON 8 MG PO TBDP: 8.0000 mg | ORAL_TABLET | Freq: Three times a day (TID) | ORAL | 0 refills | Status: DC

## 2017-05-09 MED ORDER — INSULIN DETEMIR 100 UNIT/ML FLEXPEN: 12.0000 [IU] | PEN_INJECTOR | Freq: Two times a day (BID) | SUBCUTANEOUS | 11 refills | Status: DC

## 2017-05-09 MED ORDER — ERYTHROMYCIN BASE 500 MG PO TABS: 250.0000 mg | ORAL_TABLET | Freq: Three times a day (TID) | ORAL | 0 refills | Status: DC

## 2017-05-09 NOTE — Progress Notes (Signed)
Pt from home and was active with [facility] for PT/OT. Will need MD orders for PT/OT/RN at dc to resume services. Pt is also followed by the [facility] at [facility]. They will continue to follow her for primary care services at discharge.  Marney Doctor RN,BSN,NCM [phone]

## 2017-05-09 NOTE — Discharge Summary (Addendum)
Physician Discharge Summary  [name] [id number] DOB: June 06, 1966 DOA: 05/06/2017  PCP: [name], MD  Admit date: 05/06/2017 Discharge date: 05/09/2017  Time spent: 35 minutes  Recommendations for Outpatient Follow-up:  Repeat BMET to follow electrolytes and renal function  Assess BP and adjust/start permanent medication for BP control Please follow compliance and insurance approval for use of erythromycin for her gastroparesis  Close follow up and further adjustments as needed on her hypoglycemic regimen   Discharge Diagnoses:  Type 1 DKA, without Coma AKI Diabetic neuropathy, type I diabetes mellitus ([place]) Diabetic gastroparesis ([place]) HLD (hyperlipidemia) Essential hypertension Noncompliance with treatment plan Depression Hyperkalemia Pyuria   Discharge Condition: stable and improved. Patient discharge home with instructions to follow up with PCP in 10 days and to be compliant with medications.  Diet recommendation: modified carbohydrates and heart healthy diet   Filed Weights   05/06/17 0836  Weight: 63.5 kg (140 lb)    History of present illness:   51 year old female with known type 1 diabetes mellitus, recurrent DKA's (currently coursing her seventh admission in the last 6 months) hypertension, hyperlipidemia and history of gastroparesis; who presented with elevated blood sugar, nausea/vomiting, poor oral intake and abdominal pain.  In the ED patient was found to have DKA and a [facility] hospitalist has been called to admit the patient for further management and treatment.   Hospital Course:  1-Type I DKA without coma -Resolved -Patient discharge on lantus and SSI -Tolerating diet and with control CBG's at discharge -advise to follow modified carb diet   2-hyperlipidemia -Continue statins  3-hypertension -Blood pressure stable at discharge -will resume PRN diuretics as previously taking  -advise to follow heart healthy diet and to maintain adequate  hydration.   4-Noncompliance with treatment -Patient educated about her condition and the need for compliance -She has expressed that due to the gastroparesis sometimes is difficult to eat and at the same time unable to continue using insulin therapy as instructed  5-acute renal injury/metabolic acidosis -Improved/resolved with IV fluids -Continue following renal function with BMET at follow up visit -Started on bicarb daily.  6-depression/anxiety/tardive dyskinesia -No suicidal ideation or hallucination -Stable overall  -Continue Depakote, Klonopin, BuSpar, Cogentin -Continue pyridostigmine -avoid use of reglan   7-hyperkalemia -resolved with IVF's and after usage of IV insulin for her DKA -will recommend BMET at follow up visit to assess electrolytes trend   8-gastroparesis -Will continue controlling her diabetes -Will recommend erythromycin orally as an outpatient -Continue PPI, now BID -continue PRN antiemetics.  9-pyuria -patient reporting increase frequency, but using lasix PRN and also with hyperglycemic -UA with neg nitrates and patient denying dysuria -positive leukocyte esterase -will check culture and treat depending results. -essentially normal WBC's and no fever.   Procedures:  See below for x-ray reports   Consultations:  None   Discharge Exam: Vitals:   05/08/17 1935 05/09/17 0420  BP: 130/79 134/67  Pulse: 79 86  Resp: 16 18  Temp: 98.1 F (36.7 C) 98 F (36.7 C)  SpO2: 100% 100%   General exam: Appears calm and comfortable, still nauseated but reports no further active vomiting.  She is tolerating diet.   Respiratory system: Good air movement, normal respiratory effort, no wheezing, no crackles. Cardiovascular system: S1 and S2, regular rate and rhythm, no JVD, no rubs, no gallops, no murmurs. Gastrointestinal system: Soft, nondistended, no tenderness; positive BS. Central nervous system: Alert, awake and oriented x3.  No focal  neurologic motor deficit appreciated. Extremities: No cyanosis, no clubbing  Psychiatry: Normal judgment, no suicidal ideation, good insight.    Discharge Instructions   Discharge Instructions    Diet - low sodium heart healthy   Complete by:  As directed    Diet Carb Modified   Complete by:  As directed    Discharge instructions   Complete by:  As directed    Keep yourself well hydrated Follow up with PCP in 10 days Take medications as prescribed Please follow low carb diet and heart healthy diet     Current Discharge Medication List    START taking these medications   Details  erythromycin base (E-MYCIN) 500 MG tablet Take 0.5 tablets (250 mg total) 3 (three) times daily by mouth. Qty: 90 tablet, Refills: 0    sodium bicarbonate 650 MG tablet Take 1 tablet (650 mg total) daily by mouth. Qty: 30 tablet, Refills: 0      CONTINUE these medications which have CHANGED   Details  Insulin Detemir (LEVEMIR FLEXPEN) 100 UNIT/ML Pen Inject 12 Units 2 (two) times daily into the skin. Qty: 15 mL, Refills: 11    ondansetron (ZOFRAN-ODT) 8 MG disintegrating tablet Take 1 tablet (8 mg total) every 8 (eight) hours by mouth. Qty: 30 tablet, Refills: 0   Associated Diagnoses: Diabetic gastroparesis ([place])    pantoprazole (PROTONIX) 40 MG tablet Take 1 tablet (40 mg total) 2 (two) times daily by mouth. Qty: 60 tablet, Refills: 0      CONTINUE these medications which have NOT CHANGED   Details  benztropine (COGENTIN) 1 MG tablet Take 1 tablet (1 mg total) by mouth 2 (two) times daily. Qty: 60 tablet, Refills: 0    busPIRone (BUSPAR) 5 MG tablet Take 5 mg 3 (three) times daily by mouth.    clonazePAM (KLONOPIN) 0.5 MG disintegrating tablet Take 1 tablet (0.5 mg total) by mouth 2 (two) times daily. Qty: 60 tablet, Refills: 0    divalproex (DEPAKOTE SPRINKLE) 125 MG capsule Take 125 mg by mouth 2 (two) times daily.    finasteride (PROSCAR) 5 MG tablet Take 1 tablet (5 mg total) by  mouth daily. Qty: 30 tablet, Refills: 0    furosemide (LASIX) 40 MG tablet Take 40 mg by mouth daily as needed for fluid.     insulin aspart (NOVOLOG FLEXPEN) 100 UNIT/ML FlexPen Inject as per sliding scale subcutaneously before meals and at bedtime 70 - 119 = 0 units 120 - 150 = 2 units 151 - 180 = 4 units 181 - 210 = 5 units Greater than 400 notify MD 211 - 240 = 6 units 241 - 270 = 8 units 271 - 300 = 9 units 301 - 330 = 10 units 331 - 360 = 11 units 361 - 400 = 12 units    polyethylene glycol (MIRALAX / GLYCOLAX) packet Take 17 g by mouth daily.    pregabalin (LYRICA) 150 MG capsule Take 150 mg 2 (two) times daily by mouth.    PYRIDOSTIGMINE BROMIDE PO Give 30 mg by mouth daily before meals      STOP taking these medications     LORazepam (ATIVAN) 0.5 MG tablet      Sodium Phosphates (FLEET ENEMA RE)      gabapentin (NEURONTIN) 300 MG capsule      promethazine (PHENERGAN) 25 MG suppository        Allergies  Allergen Reactions  . Anesthetics, Amide Nausea And Vomiting  . Penicillins Diarrhea and Nausea And Vomiting    Has patient had a PCN reaction  causing immediate rash, facial/tongue/throat swelling, SOB or lightheadedness with hypotension: Yes Has patient had a PCN reaction causing severe rash involving mucus membranes or skin necrosis: No Has patient had a PCN reaction that required hospitalization No Has patient had a PCN reaction occurring within the last 10 years: Yes  If all of the above answers are "NO", then may proceed with Cephalosporin use.   . Buprenorphine Hcl Rash  . Encainide Nausea And Vomiting  . Metoclopramide Other (See Comments)    Dystonia, muscle rigidity.    Follow-up Information    [name], MD Follow up.   Specialty:  Family Medicine Contact information: [place] [address] [phone]           The results of significant diagnostics from this hospitalization (including imaging, microbiology,  ancillary and laboratory) are listed below for reference.    Significant Diagnostic Studies: No results found.  Microbiology: Recent Results (from the past 240 hour(s))  MRSA PCR Screening     Status: None   Collection Time: 05/06/17 12:48 PM  Result Value Ref Range Status   MRSA by PCR NEGATIVE NEGATIVE Final    Comment:        The GeneXpert MRSA Assay (FDA approved for NASAL specimens only), is one component of a comprehensive MRSA colonization surveillance program. It is not intended to diagnose MRSA infection nor to guide or monitor treatment for MRSA infections.      Labs: Basic Metabolic Panel: Recent Labs  Lab 05/06/17 1804 05/06/17 2156 05/07/17 0349 05/08/17 1431 05/09/17 0357  NA 138 138 138 135 140  K 4.0 4.2 3.9 5.0 4.1  CL 112* 112* 112* 114* 117*  CO2 17* 17* 18* 16* 19*  GLUCOSE 153* 116* 128* 78 145*  BUN 23* 24* 27* 16 14  CREATININE 1.49* 1.72* 2.09* 1.05* 0.98  CALCIUM 9.4 9.3 9.2 8.1* 8.5*   Liver Function Tests: Recent Labs  Lab 05/06/17 0930 05/07/17 0349  AST 31 18  ALT 16 7*  ALKPHOS 122 80  BILITOT 1.7* 0.5  PROT 10.0* 7.2  ALBUMIN 4.6 3.3*   Recent Labs  Lab 05/06/17 0930  LIPASE 16   CBC: Recent Labs  Lab 05/06/17 0930 05/07/17 0349  WBC 14.5* 12.5*  NEUTROABS 13.3*  --   HGB 13.9 11.9*  HCT 42.9 37.9  MCV 79.4 80.6  PLT 356 288    CBG: Recent Labs  Lab 05/08/17 2025 05/08/17 2356 05/09/17 0417 05/09/17 0754 05/09/17 1156  GLUCAP 157* 159* 141* 138* 222*    Signed:  Barton Dubois MD.  [facility] 05/09/2017, 12:54 PM

## 2017-05-09 NOTE — Telephone Encounter (Addendum)
Call received from Marney Doctor, RN CM. Informed her that the patient is known to [facility].  She has been followed by the [facility].  Will schedule follow up when discharge is determined. The patient has been referred to Dr Darleene Cleaver, behavioral health, but has not been able to follow up.  Will follow her hospital course.

## 2017-05-09 NOTE — Progress Notes (Signed)
This CM was notified by RN that pt states she is unable to get her script for Erythromycin due to Medicaid not covering it. This CM called and spoke with pharmacist at [facility] who informed me of specific Erythromycin that is covered by her Medicaid. [place] # [id number] placed on Erythromycin script for [place]. [place] to order Erythromycin and will have it for pt to pick up at 10am tomorrow. All above information given to pt.  Marney Doctor RN,BSN,NCM [phone]

## 2017-05-10 ENCOUNTER — Telehealth: Payer: Self-pay

## 2017-05-10 MED FILL — ERYTHROMYCIN BASE 500 MG TA: 500 | 30 days supply | Qty: 45 | Fill #0

## 2017-05-10 NOTE — Telephone Encounter (Signed)
Attempted to contact the patient to to check on her and to discuss scheduling a follow up at [facility]. Call placed to # [phone] and the voice mailbox was full, unable to leave a message.  This CM spoke to Glade Lloyd, [place] confirmed that the patient picked up the erythromycin this morning that was ordered for her.

## 2017-05-13 ENCOUNTER — Telehealth: Payer: Self-pay

## 2017-05-13 NOTE — Telephone Encounter (Signed)
[facility] Post-discharge Follow-Up Phone Call:  Date of Discharge:  05/09/2017 Principal Discharge Diagnosis(es):  Type 1  DKA, diabetic gastroparesis Post-discharge Communication: (Clearly document all attempts clearly and date contact made) call placed to the patient.  Call Completed: Yes                    With Whom: patient Interpreter Needed: No     Please check all that apply:  X Patient is knowledgeable of his/her condition(s) and/or treatment. ? Patient is caring for self at home.  X  Patient is receiving assist at home from family and/or caregiver. Family and/or caregiver is knowledgeable of patient's condition(s) and/or treatment. - she lives with her sister but her sister works all day.  ? Patient is receiving home health services. If so, name of agency.     Medication Reconciliation:  ? Medication list reviewed with patient. X  Patient obtained all discharge medications. If not, why? She has not obtained the erythromycin or sodium bicarbonate.  As per Veneda Melter, [place] the erythromycin is at the [facility] pharmacy for pick up by the patient. She said that her sister may pick it up today. She has not picked up the sodium bicarbonate and [facility] does not have that prescription. The patient stated that she will check her discharge paperwork and will plan to have that filled at her local [facility].  She said she will need to have the money to pay for it and doesn't have the money right now. Explained to her that if she is not able to pay the co-pays for the prescriptions that are filled at [facility], she can put the charges on an account and pay off the balance when she is able.   The changed medications were reviewed and she said that she is still taking levemir 14 units twice daily, not 12 units as ordered.  She did not report any questions about her other medications or discharge instructions.   She explained that she has been experiencing pain and burning  with urination and noted that this started in the hospital. She denied any fever or bleeding.  She is concerned about possibly having a UTI. She was requesting an antibiotic be ordered but she is not able to get to the clinic this week.  She needs to schedule medicaid transportation.   Dr Jarold Song informed of this complaint and noted that the patient would need to be seen by a provider and should not come to the clinic just to submit a urine sample.  As per dr Jarold Song, the patient can be seen by the [facility] walk in provider on 05/15/17 for the UTI symptoms and then by Dr Jarold Song for her [facility]CC hospital follow up appointment on 05/20/17.   An appointment has been scheduled for the patient in the walk in clinic at [facility] on 05/15/17 @ 1430 for UTI eval only.   She has a TCC hospital follow up appointment scheduled for 05/20/17 @ 1000.    Activities of Daily Living:  X  Independent ? Needs assist (describe; ? home DME used) ? Total Care (describe, ? home DME used)   Community resources in place for patient:  X None  ? Home Health/Home DME ? Assisted Living ? Support Group          :        Questions/Concerns discussed: no other questions/concerns reported at this time.  Call placed to the patient after discussing her UTI  complaint with Dr Jarold Song to inform her of the appointments scheduled. Informed her that she can contact medicaid transportation for the 05/20/17 appointment. Due to the fact that she is [facility] patient, [facility] will provide cab transportation for her to the clinic on 05/15/17.  Explained to her that the clinic will pay for the cab to/from her home. The patient stated that she would think about it and confirm with this CM tomorrow - 05/14/17.

## 2017-05-14 ENCOUNTER — Telehealth: Payer: Self-pay

## 2017-05-14 NOTE — Telephone Encounter (Signed)
Attempted to contact the patient to confirm her appointment for tomorrow and to schedule cab  transportation for her.  Call placed to # [phone] and a HIPAA compliant voicemail message was left requesting a call back to # [phone].

## 2017-05-15 ENCOUNTER — Telehealth: Payer: Self-pay | Admitting: Family Medicine

## 2017-05-15 ENCOUNTER — Ambulatory Visit: Payer: Self-pay

## 2017-05-15 NOTE — Telephone Encounter (Signed)
Will route to PCP 

## 2017-05-15 NOTE — Telephone Encounter (Signed)
Noted  

## 2017-05-15 NOTE — Telephone Encounter (Signed)
Caryl Pina from [facility] called to let pt. PCP know that they have been trying to reach the pt. For a couple of days and pt. Has not returned their call. Caryl Pina states that they will have to close the case.

## 2017-05-15 NOTE — Telephone Encounter (Signed)
Call placed to patient [phone] to confirm her office visit today at 2:30pm and to inquire if she will be needing transportation. No answer. Left message for patient to return my call at [phone].

## 2017-05-16 ENCOUNTER — Other Ambulatory Visit: Payer: Self-pay

## 2017-05-16 ENCOUNTER — Encounter (HOSPITAL_COMMUNITY): Payer: Self-pay

## 2017-05-16 ENCOUNTER — Inpatient Hospital Stay (HOSPITAL_COMMUNITY)
Admission: EM | Admit: 2017-05-16 | Discharge: 2017-05-18 | DRG: 074 | Disposition: A | Discharge status: Home / Self Care | Payer: Medicaid Other | Attending: Family Medicine | Admitting: Family Medicine

## 2017-05-16 DIAGNOSIS — Z888 Allergy status to other drugs, medicaments and biological substances status: Secondary | ICD-10-CM | POA: Diagnosis not present

## 2017-05-16 DIAGNOSIS — R824 Acetonuria: Secondary | ICD-10-CM | POA: Diagnosis not present

## 2017-05-16 DIAGNOSIS — Z9119 Patient's noncompliance with other medical treatment and regimen: Secondary | ICD-10-CM | POA: Diagnosis not present

## 2017-05-16 DIAGNOSIS — K219 Gastro-esophageal reflux disease without esophagitis: Secondary | ICD-10-CM | POA: Diagnosis present

## 2017-05-16 DIAGNOSIS — E1143 Type 2 diabetes mellitus with diabetic autonomic (poly)neuropathy: Secondary | ICD-10-CM

## 2017-05-16 DIAGNOSIS — F329 Major depressive disorder, single episode, unspecified: Secondary | ICD-10-CM | POA: Diagnosis present

## 2017-05-16 DIAGNOSIS — Z880 Allergy status to penicillin: Secondary | ICD-10-CM | POA: Diagnosis not present

## 2017-05-16 DIAGNOSIS — E108 Type 1 diabetes mellitus with unspecified complications: Secondary | ICD-10-CM | POA: Diagnosis not present

## 2017-05-16 DIAGNOSIS — E785 Hyperlipidemia, unspecified: Secondary | ICD-10-CM | POA: Diagnosis present

## 2017-05-16 DIAGNOSIS — E1043 Type 1 diabetes mellitus with diabetic autonomic (poly)neuropathy: Secondary | ICD-10-CM | POA: Diagnosis not present

## 2017-05-16 DIAGNOSIS — E875 Hyperkalemia: Secondary | ICD-10-CM | POA: Diagnosis present

## 2017-05-16 DIAGNOSIS — K3184 Gastroparesis: Secondary | ICD-10-CM

## 2017-05-16 DIAGNOSIS — Z833 Family history of diabetes mellitus: Secondary | ICD-10-CM | POA: Diagnosis not present

## 2017-05-16 DIAGNOSIS — N179 Acute kidney failure, unspecified: Secondary | ICD-10-CM | POA: Diagnosis present

## 2017-05-16 DIAGNOSIS — E1040 Type 1 diabetes mellitus with diabetic neuropathy, unspecified: Secondary | ICD-10-CM | POA: Diagnosis present

## 2017-05-16 DIAGNOSIS — Z9111 Patient's noncompliance with dietary regimen: Secondary | ICD-10-CM | POA: Diagnosis not present

## 2017-05-16 DIAGNOSIS — F419 Anxiety disorder, unspecified: Secondary | ICD-10-CM | POA: Diagnosis present

## 2017-05-16 DIAGNOSIS — I10 Essential (primary) hypertension: Secondary | ICD-10-CM | POA: Diagnosis present

## 2017-05-16 DIAGNOSIS — F32A Depression, unspecified: Secondary | ICD-10-CM

## 2017-05-16 DIAGNOSIS — Z91199 Patient's noncompliance with other medical treatment and regimen due to unspecified reason: Secondary | ICD-10-CM

## 2017-05-16 LAB — CBC
HEMATOCRIT: 38.6 % (ref 36.0–46.0)
Hemoglobin: 13.1 g/dL (ref 12.0–15.0)
MCH: 26.8 pg (ref 26.0–34.0)
MCHC: 33.9 g/dL (ref 30.0–36.0)
MCV: 79.1 fL (ref 78.0–100.0)
Platelets: 460 10*3/uL — ABNORMAL HIGH (ref 150–400)
RBC: 4.88 MIL/uL (ref 3.87–5.11)
RDW: 20 % — AB (ref 11.5–15.5)
WBC: 14.4 10*3/uL — ABNORMAL HIGH (ref 4.0–10.5)

## 2017-05-16 LAB — BASIC METABOLIC PANEL
ANION GAP: 11 (ref 5–15)
BUN: 12 mg/dL (ref 6–20)
CALCIUM: 9.2 mg/dL (ref 8.9–10.3)
CO2: 21 mmol/L — ABNORMAL LOW (ref 22–32)
Chloride: 108 mmol/L (ref 101–111)
Creatinine, Ser: 1.35 mg/dL — ABNORMAL HIGH (ref 0.44–1.00)
GFR, EST AFRICAN AMERICAN: 52 mL/min — AB (ref >60)
GFR, EST NON AFRICAN AMERICAN: 45 mL/min — AB (ref >60)
GLUCOSE: 186 mg/dL — AB (ref 65–99)
Potassium: 3.7 mmol/L (ref 3.5–5.1)
Sodium: 140 mmol/L (ref 135–145)

## 2017-05-16 LAB — COMPREHENSIVE METABOLIC PANEL
ALBUMIN: 3.9 g/dL (ref 3.5–5.0)
ALT: 17 U/L (ref 14–54)
AST: 45 U/L — AB (ref 15–41)
Alkaline Phosphatase: 89 U/L (ref 38–126)
Anion gap: 15 (ref 5–15)
BILIRUBIN TOTAL: 2.4 mg/dL — AB (ref 0.3–1.2)
BUN: 10 mg/dL (ref 6–20)
CHLORIDE: 101 mmol/L (ref 101–111)
CO2: 20 mmol/L — ABNORMAL LOW (ref 22–32)
Calcium: 9.4 mg/dL (ref 8.9–10.3)
Creatinine, Ser: 1.2 mg/dL — ABNORMAL HIGH (ref 0.44–1.00)
GFR calc Af Amer: >60 mL/min (ref >60)
GFR calc non Af Amer: 52 mL/min — ABNORMAL LOW (ref >60)
GLUCOSE: 344 mg/dL — AB (ref 65–99)
POTASSIUM: 5.7 mmol/L — AB (ref 3.5–5.1)
Sodium: 136 mmol/L (ref 135–145)
Total Protein: 8.1 g/dL (ref 6.5–8.1)

## 2017-05-16 LAB — URINALYSIS, ROUTINE W REFLEX MICROSCOPIC
BACTERIA UA: NONE SEEN
Bilirubin Urine: NEGATIVE
Glucose, UA: >=500 mg/dL — AB
Ketones, ur: 80 mg/dL — AB
Nitrite: NEGATIVE
PH: 6 (ref 5.0–8.0)
Protein, ur: >=300 mg/dL — AB
SPECIFIC GRAVITY, URINE: 1.022 (ref 1.005–1.030)

## 2017-05-16 LAB — GLUCOSE, CAPILLARY
GLUCOSE-CAPILLARY: 115 mg/dL — AB (ref 65–99)
Glucose-Capillary: 333 mg/dL — ABNORMAL HIGH (ref 65–99)
Glucose-Capillary: 238 mg/dL — ABNORMAL HIGH (ref 65–99)

## 2017-05-16 LAB — CBG MONITORING, ED: Glucose-Capillary: 321 mg/dL — ABNORMAL HIGH (ref 65–99)

## 2017-05-16 LAB — LIPASE, BLOOD: LIPASE: 16 U/L (ref 11–51)

## 2017-05-16 MED ORDER — ONDANSETRON HCL 4 MG/2ML IJ SOLN: 4.0000 mg | Freq: Four times a day (QID) | INTRAMUSCULAR | Status: DC | PRN

## 2017-05-16 MED ORDER — LORAZEPAM 2 MG/ML IJ SOLN: 1.0000 mg | Freq: Once | INTRAMUSCULAR | Status: DC | PRN

## 2017-05-16 MED ORDER — SODIUM CHLORIDE 0.9 % IV SOLN
INTRAVENOUS | Status: DC
  Administered 2017-05-16 – 2017-05-17 (×2): via INTRAVENOUS

## 2017-05-16 MED ORDER — ENOXAPARIN SODIUM 40 MG/0.4ML ~~LOC~~ SOLN
40.0000 mg | SUBCUTANEOUS | Status: DC
  Administered 2017-05-17 (×2): 40 mg via SUBCUTANEOUS
  Filled 2017-05-16 (×2): qty 0.4

## 2017-05-16 MED ORDER — INSULIN DETEMIR 100 UNIT/ML ~~LOC~~ SOLN
8.0000 [IU] | Freq: Two times a day (BID) | SUBCUTANEOUS | Status: DC
  Administered 2017-05-17 – 2017-05-18 (×4): 8 [IU] via SUBCUTANEOUS
  Filled 2017-05-16 (×5): qty 0.08

## 2017-05-16 MED ORDER — ERYTHROMYCIN LACTOBIONATE 500 MG IV SOLR: 250.0000 mg | Freq: Four times a day (QID) | INTRAVENOUS | Status: DC

## 2017-05-16 MED ORDER — PROMETHAZINE HCL 25 MG/ML IJ SOLN
25.0000 mg | Freq: Once | INTRAMUSCULAR | Status: AC
  Administered 2017-05-16: 25 mg via INTRAVENOUS
  Filled 2017-05-16: qty 1

## 2017-05-16 MED ORDER — LACTATED RINGERS IV BOLUS (SEPSIS)
1000.0000 mL | Freq: Once | INTRAVENOUS | Status: AC
  Administered 2017-05-16: 1000 mL via INTRAVENOUS

## 2017-05-16 MED ORDER — INSULIN ASPART 100 UNIT/ML ~~LOC~~ SOLN
0.0000 [IU] | SUBCUTANEOUS | Status: DC
  Administered 2017-05-16: 3 [IU] via SUBCUTANEOUS
  Administered 2017-05-16: 7 [IU] via SUBCUTANEOUS
  Administered 2017-05-17: 3 [IU] via SUBCUTANEOUS
  Administered 2017-05-17: 2 [IU] via SUBCUTANEOUS

## 2017-05-16 MED ORDER — ACETAMINOPHEN 325 MG PO TABS: 650.0000 mg | ORAL_TABLET | Freq: Four times a day (QID) | ORAL | Status: DC | PRN

## 2017-05-16 MED ORDER — PROMETHAZINE HCL 25 MG/ML IJ SOLN
6.2500 mg | Freq: Three times a day (TID) | INTRAMUSCULAR | Status: DC | PRN
  Administered 2017-05-16: 6.25 mg via INTRAVENOUS
  Filled 2017-05-16: qty 1

## 2017-05-16 MED ORDER — HALOPERIDOL LACTATE 5 MG/ML IJ SOLN
5.0000 mg | Freq: Once | INTRAMUSCULAR | Status: AC
  Administered 2017-05-16: 5 mg via INTRAVENOUS
  Filled 2017-05-16: qty 1

## 2017-05-16 MED ORDER — ACETAMINOPHEN 650 MG RE SUPP
650.0000 mg | Freq: Four times a day (QID) | RECTAL | Status: DC | PRN
Start: 2017-05-16 — End: 2017-05-18

## 2017-05-16 MED ORDER — ONDANSETRON HCL 4 MG PO TABS: 4.0000 mg | ORAL_TABLET | Freq: Four times a day (QID) | ORAL | Status: DC | PRN

## 2017-05-16 MED ORDER — LORAZEPAM 2 MG/ML IJ SOLN
1.0000 mg | Freq: Once | INTRAMUSCULAR | Status: AC
  Administered 2017-05-16: 1 mg via INTRAVENOUS
  Filled 2017-05-16: qty 1

## 2017-05-16 NOTE — ED Notes (Signed)
Bed: GB61 Expected date: 05/16/17 Expected time: 8:53 AM Means of arrival: Ambulance Comments: Abd pain, N/V history of gastro problems

## 2017-05-16 NOTE — H&P (Signed)
History and Physical    Tawania Daponte BOF:[id number] DOB: 02/15/1966 DOA: 05/16/2017  Referring MD/NP/PA: Kathrynn Humble PCP: [name], MD Outpatient Specialists: Azzie Glatter Patient coming from: home  Chief Complaint: flare of my gastroparesis  HPI: Diane Hanel is a 51 y.o. female with medical history significant of DM, gastroparesis, depression who comes in with c/o vomiting.  She was just recently hospitalized from 11/12 to 11/15.  At that time she was started on erythromycin for gastroparesis.  Patient states compliance with this medication.  She is currently spitting into an emesis bag/holdin secretions in her mouth and then swallowing.  No current vomiting.  Most further history obtained from chart as patient would only occasionally answer questions, but did deny sore throat.    IN the ER, she was given ativan, haldol and was unable to tolerate a PO challenge as she continued to "spit".   Review of Systems: all systems reviewed, negative unless stated above in HPI   Past Medical History:  Diagnosis Date  . Depression   . Diabetes mellitus without complication ([place])   . Essential hypertension   . Gastroparesis   . GERD (gastroesophageal reflux disease)   . HLD (hyperlipidemia)     Past Surgical History:  Procedure Laterality Date  . COLONOSCOPY  09/27/2014   at [facility]  . ESOPHAGOGASTRODUODENOSCOPY  09/27/2014   at [facility], Dr Rolan Lipa. biospy neg for celiac, neg for H pylori.   . EYE SURGERY    . gailstones    . IR FLUORO GUIDE CV LINE RIGHT  02/01/2017  . IR FLUORO GUIDE CV LINE RIGHT  03/06/2017  . IR FLUORO GUIDE CV LINE RIGHT  03/25/2017  . IR GENERIC HISTORICAL  01/24/2016   IR FLUORO GUIDE CV LINE RIGHT 01/24/2016 Darrell K Allred, PA-C [facility]  . IR GENERIC HISTORICAL  01/24/2016   IR US GUIDE VASC ACCESS RIGHT 01/24/2016 Darrell K Allred, PA-C [facility]  . IR US GUIDE VASC ACCESS RIGHT  02/01/2017  . IR US GUIDE VASC ACCESS RIGHT  03/06/2017  . IR US  GUIDE VASC ACCESS RIGHT  03/25/2017  . POSTERIOR VITRECTOMY AND MEMBRANE PEEL-LEFT EYE  09/28/2002  . POSTERIOR VITRECTOMY AND MEMBRANE PEEL-RIGHT EYE  03/16/2002  . RETINAL DETACHMENT SURGERY       reports that  has never smoked. she has never used smokeless tobacco. She reports that she does not drink alcohol or use drugs.  Allergies  Allergen Reactions  . Anesthetics, Amide Nausea And Vomiting  . Penicillins Diarrhea and Nausea And Vomiting    Has patient had a PCN reaction causing immediate rash, facial/tongue/throat swelling, SOB or lightheadedness with hypotension: Yes Has patient had a PCN reaction causing severe rash involving mucus membranes or skin necrosis: No Has patient had a PCN reaction that required hospitalization No Has patient had a PCN reaction occurring within the last 10 years: Yes  If all of the above answers are "NO", then may proceed with Cephalosporin use.   . Buprenorphine Hcl Rash  . Encainide Nausea And Vomiting  . Metoclopramide Other (See Comments)    Dystonia, muscle rigidity.     Family History  Problem Relation Age of Onset  . Cystic fibrosis Mother   . Hypertension Father   . Diabetes Brother   . Hypertension Maternal Grandmother      Prior to Admission medications   Medication Sig Start Date End Date Taking? Authorizing Provider  benztropine (COGENTIN) 1 MG tablet Take 1 tablet (1 mg total) by  mouth 2 (two) times daily. 04/03/17   Dessa Phi, DO  busPIRone (BUSPAR) 5 MG tablet Take 5 mg 3 (three) times daily by mouth.    [provider]  clonazePAM (KLONOPIN) 0.5 MG disintegrating tablet Take 1 tablet (0.5 mg total) by mouth 2 (two) times daily. 04/15/17   Gerlene Fee, NP  divalproex (DEPAKOTE SPRINKLE) 125 MG capsule Take 125 mg by mouth 2 (two) times daily. 04/16/17   [provider]  erythromycin base (E-MYCIN) 500 MG tablet Take 0.5 tablets (250 mg total) 3 (three) times daily by mouth. 05/09/17 05/09/18  Barton Dubois, MD  finasteride (PROSCAR) 5 MG tablet Take 1 tablet (5 mg total) by mouth daily. 04/04/17   Dessa Phi, DO  furosemide (LASIX) 40 MG tablet Take 40 mg by mouth daily as needed for fluid.     [provider]  insulin aspart (NOVOLOG FLEXPEN) 100 UNIT/ML FlexPen Inject as per sliding scale subcutaneously before meals and at bedtime 70 - 119 = 0 units 120 - 150 = 2 units 151 - 180 = 4 units 181 - 210 = 5 units Greater than 400 notify MD 211 - 240 = 6 units 241 - 270 = 8 units 271 - 300 = 9 units 301 - 330 = 10 units 331 - 360 = 11 units 361 - 400 = 12 units    [provider]  Insulin Detemir (LEVEMIR FLEXPEN) 100 UNIT/ML Pen Inject 12 Units 2 (two) times daily into the skin. 05/09/17   Barton Dubois, MD  ondansetron (ZOFRAN-ODT) 8 MG disintegrating tablet Take 1 tablet (8 mg total) every 8 (eight) hours by mouth. 05/09/17   Barton Dubois, MD  pantoprazole (PROTONIX) 40 MG tablet Take 1 tablet (40 mg total) 2 (two) times daily by mouth. 05/09/17   Barton Dubois, MD  polyethylene glycol (MIRALAX / GLYCOLAX) packet Take 17 g by mouth daily. 04/11/17   [provider]  pregabalin (LYRICA) 150 MG capsule Take 150 mg 2 (two) times daily by mouth. 05/05/15   [provider]  PYRIDOSTIGMINE BROMIDE PO Give 30 mg by mouth daily before meals    [provider]  sodium bicarbonate 650 MG tablet Take 1 tablet (650 mg total) daily by mouth. 05/10/17   Barton Dubois, MD    Physical Exam: Vitals:   05/16/17 1430 05/16/17 1530 05/16/17 1600 05/16/17 1630  BP: (!) 178/88 (!) 175/81 (!) 178/86 (!) 178/83  Pulse: (!) 107 (!) 115 (!) 112 (!) 113  Resp: (!) 21 (!) 22 (!) 27 18  SpO2: 100% 99% 100% 100%      Constitutional: NAD, calm, comfortable Vitals:   05/16/17 1430 05/16/17 1530 05/16/17 1600 05/16/17 1630  BP: (!) 178/88 (!) 175/81 (!) 178/86 (!) 178/83  Pulse: (!) 107 (!) 115 (!) 112 (!) 113  Resp: (!) 21 (!) 22 (!) 27 18  SpO2:  100% 99% 100% 100%   Eyes: PERRL, lids and conjunctivae normal ENMT: mouth full of saliva  Neck: normal, supple, no masses, no thyromegaly Respiratory: clear to auscultation bilaterally, no wheezing, no crackles. Normal respiratory effort. No accessory muscle use.  Cardiovascular: Regular rate and rhythm, no murmurs / rubs / gallops. No extremity edema. 2+ pedal pulses. No carotid bruits.  Abdomen: +BS, mild epigastrictenderness Musculoskeletal: no clubbing / cyanosis. No joint deformity upper and lower extremities. Good ROM, no contractures. Normal muscle tone.  Skin: no rashes, lesions, ulcers. No induration Neurologic: CN 2-12 grossly intact. Sensation intact, DTR normal. Strength 5/5  in all 4.  Psychiatric: Normal judgment and insight. Alert and oriented x 3. Normal mood.     Labs on Admission: I have personally reviewed following labs and imaging studies  CBC: Recent Labs  Lab 05/16/17 1200  WBC 14.4*  HGB 13.1  HCT 38.6  MCV 79.1  PLT 947*   Basic Metabolic Panel: Recent Labs  Lab 05/16/17 1200  NA 136  K 5.7*  CL 101  CO2 20*  GLUCOSE 344*  BUN 10  CREATININE 1.20*  CALCIUM 9.4   GFR: Estimated Creatinine Clearance: 49.7 mL/min (A) (by C-G formula based on SCr of 1.2 mg/dL (H)). Liver Function Tests: Recent Labs  Lab 05/16/17 1200  AST 45*  ALT 17  ALKPHOS 89  BILITOT 2.4*  PROT 8.1  ALBUMIN 3.9   Recent Labs  Lab 05/16/17 1200  LIPASE 16   No results for input(s): AMMONIA in the last 168 hours. Coagulation Profile: No results for input(s): INR, PROTIME in the last 168 hours. Cardiac Enzymes: No results for input(s): CKTOTAL, CKMB, CKMBINDEX, TROPONINI in the last 168 hours. BNP (last 3 results) No results for input(s): PROBNP in the last 8760 hours. HbA1C: No results for input(s): HGBA1C in the last 72 hours. CBG: Recent Labs  Lab 05/09/17 1742 05/16/17 1251  GLUCAP 98 321*   Lipid Profile: No results for input(s): CHOL, HDL, LDLCALC,  TRIG, CHOLHDL, LDLDIRECT in the last 72 hours. Thyroid Function Tests: No results for input(s): TSH, T4TOTAL, FREET4, T3FREE, THYROIDAB in the last 72 hours. Anemia Panel: No results for input(s): VITAMINB12, FOLATE, FERRITIN, TIBC, IRON, RETICCTPCT in the last 72 hours. Urine analysis:    Component Value Date/Time   COLORURINE YELLOW (A) 05/16/2017 1230   APPEARANCEUR HAZY (A) 05/16/2017 1230   LABSPEC 1.022 05/16/2017 1230   PHURINE 6.0 05/16/2017 1230   GLUCOSEU >=500 (A) 05/16/2017 1230   HGBUR LARGE (A) 05/16/2017 1230   BILIRUBINUR NEGATIVE 05/16/2017 1230   BILIRUBINUR neg 01/17/2017 1533   KETONESUR 80 (A) 05/16/2017 1230   PROTEINUR >=300 (A) 05/16/2017 1230   UROBILINOGEN 0.2 01/17/2017 1533   UROBILINOGEN 0.2 04/27/2015 1608   NITRITE NEGATIVE 05/16/2017 1230   LEUKOCYTESUR SMALL (A) 05/16/2017 1230   Sepsis Labs: Invalid input(s): PROCALCITONIN, LACTICIDVEN No results found for this or any previous visit (from the past 240 hour(s)).   Radiological Exams on Admission: No results found.     Assessment/Plan Active Problems:   Diabetic neuropathy, type I diabetes mellitus ([place])   Anxiety and depression   Diabetic gastroparesis ([place])   Noncompliance with treatment plan  DM- type 1 per record -lower dose of long acting as type 1 -SSI q 4hours while NPO -gap 15-- recheck BMP in 4 hours and monitor gap- may need insulin gtt if worsens  Gastroparesis: acute on chronic -IV eryhtromycin -PRN zofran -has been seen by Dr.Koch at [facility]-- needs better control of blood sugar -AVIOD NARCOTICS -h/o medical non-compliance  Anxiety/depression -PRN benzo  Mild AKI -IVF  Mild hyperkalemia -IVF -recheck in AM   DVT prophylaxis: lovenox Code Status: full Family Communication: none Disposition Plan: pending Consults called: none Admission status: inpt   Geradine Girt DO [facility] Pager [phone]  If 7PM-7AM, please contact  night-coverage [email or web address] Password [email or web address]  05/16/2017, 4:56 PM

## 2017-05-16 NOTE — ED Notes (Signed)
Patient asked to give urine specimen , patient can't void at this present moment.

## 2017-05-16 NOTE — ED Provider Notes (Addendum)
[facility] Provider Note   CSN: [id number] Arrival date & time: 05/16/17  3614     History   Chief Complaint Chief Complaint  Patient presents with  . Abdominal Pain    HPI [name] is a 51 y.o. female.  HPI Patient comes in with chief complaint of nausea, vomiting, abdominal pain. Patient is a history of diabetes, gastroparesis.  She reports that she started having nausea and vomiting early in the day.  Patient also has some epigastric abdominal pain.  Symptoms are similar to her gastroparesis.  Patient denies any blood in the vomitus.  No diarrhea.  Patient reports that she is taking all her medications as prescribed.  Past Medical History:  Diagnosis Date  . Depression   . Diabetes mellitus without complication ([place])   . Essential hypertension   . Gastroparesis   . GERD (gastroesophageal reflux disease)   . HLD (hyperlipidemia)     Patient Active Problem List   Diagnosis Date Noted  . Gastroparesis 05/16/2017  . Depression   . DKA (diabetic ketoacidoses) ([place]) 05/06/2017  . Severe protein-calorie malnutrition ([place]) 04/17/2017  . Acute dystonic reaction due to drugs 04/01/2017  . Serotonin syndrome 03/25/2017  . Tardive dyskinesia   . Tachycardia 03/16/2017  . Diarrhea 03/05/2017  . DKA, type 1 ([place]) 03/18/2016  . Noncompliance with treatment plan 03/13/2016  . Essential hypertension 01/24/2016  . Abnormal ECG 11/27/2015  . HLD (hyperlipidemia)   . GERD (gastroesophageal reflux disease)   . Anemia, iron deficiency   . DKA, type 1, not at goal [facility])   . Vitamin B12 deficiency 08/16/2015  . Diabetic gastroparesis ([place])   . Diabetic neuropathy, type I diabetes mellitus ([place]) 05/18/2015  . Microcytic anemia 05/18/2015  . Anxiety and depression 05/18/2015    Past Surgical History:  Procedure Laterality Date  . COLONOSCOPY  09/27/2014   at [facility]  . ESOPHAGOGASTRODUODENOSCOPY  09/27/2014   at [facility], Dr Rolan Lipa. biospy neg for celiac, neg for H pylori.   . EYE SURGERY    . gailstones    . IR FLUORO GUIDE CV LINE RIGHT  02/01/2017  . IR FLUORO GUIDE CV LINE RIGHT  03/06/2017  . IR FLUORO GUIDE CV LINE RIGHT  03/25/2017  . IR GENERIC HISTORICAL  01/24/2016   IR FLUORO GUIDE CV LINE RIGHT 01/24/2016 Darrell K Allred, PA-C [facility]  . IR GENERIC HISTORICAL  01/24/2016   IR US GUIDE VASC ACCESS RIGHT 01/24/2016 Darrell K Allred, PA-C [facility]  . IR US GUIDE VASC ACCESS RIGHT  02/01/2017  . IR US GUIDE VASC ACCESS RIGHT  03/06/2017  . IR US GUIDE VASC ACCESS RIGHT  03/25/2017  . POSTERIOR VITRECTOMY AND MEMBRANE PEEL-LEFT EYE  09/28/2002  . POSTERIOR VITRECTOMY AND MEMBRANE PEEL-RIGHT EYE  03/16/2002  . RETINAL DETACHMENT SURGERY      OB History    No data available       Home Medications    Prior to Admission medications   Medication Sig Start Date End Date Taking? Authorizing Provider  benztropine (COGENTIN) 1 MG tablet Take 1 tablet (1 mg total) by mouth 2 (two) times daily. 04/03/17   Dessa Phi, DO  busPIRone (BUSPAR) 5 MG tablet Take 5 mg 3 (three) times daily by mouth.    [provider]  clonazePAM (KLONOPIN) 0.5 MG disintegrating tablet Take 1 tablet (0.5 mg total) by mouth 2 (two) times daily. 04/15/17   Gerlene Fee, NP  divalproex (DEPAKOTE SPRINKLE) 125 MG capsule Take 125 mg by mouth 2 (two) times daily. 04/16/17   [provider]  erythromycin base (E-MYCIN) 500 MG tablet Take 0.5 tablets (250 mg total) 3 (three) times daily by mouth. 05/09/17 05/09/18  Barton Dubois, MD  finasteride (PROSCAR) 5 MG tablet Take 1 tablet (5 mg total) by mouth daily. 04/04/17   Dessa Phi, DO  furosemide (LASIX) 40 MG tablet Take 40 mg by mouth daily as needed for fluid.     [provider]  insulin aspart (NOVOLOG FLEXPEN) 100 UNIT/ML FlexPen Inject as per sliding scale subcutaneously before meals and at bedtime 70 - 119 = 0 units 120 - 150 = 2  units 151 - 180 = 4 units 181 - 210 = 5 units Greater than 400 notify MD 211 - 240 = 6 units 241 - 270 = 8 units 271 - 300 = 9 units 301 - 330 = 10 units 331 - 360 = 11 units 361 - 400 = 12 units    [provider]  Insulin Detemir (LEVEMIR FLEXPEN) 100 UNIT/ML Pen Inject 12 Units 2 (two) times daily into the skin. 05/09/17   Barton Dubois, MD  ondansetron (ZOFRAN-ODT) 8 MG disintegrating tablet Take 1 tablet (8 mg total) every 8 (eight) hours by mouth. 05/09/17   Barton Dubois, MD  pantoprazole (PROTONIX) 40 MG tablet Take 1 tablet (40 mg total) 2 (two) times daily by mouth. 05/09/17   Barton Dubois, MD  polyethylene glycol (MIRALAX / GLYCOLAX) packet Take 17 g by mouth daily. 04/11/17   [provider]  pregabalin (LYRICA) 150 MG capsule Take 150 mg 2 (two) times daily by mouth. 05/05/15   [provider]  pyridostigmine (MESTINON) 60 MG tablet Take 30 mg by mouth 3 (three) times daily.     [provider]  sodium bicarbonate 650 MG tablet Take 1 tablet (650 mg total) daily by mouth. 05/10/17   Barton Dubois, MD    Family History Family History  Problem Relation Age of Onset  . Cystic fibrosis Mother   . Hypertension Father   . Diabetes Brother   . Hypertension Maternal Grandmother     Social History Social History   Tobacco Use  . Smoking status: Never Smoker  . Smokeless tobacco: Never Used  Substance Use Topics  . Alcohol use: No  . Drug use: No     Allergies   Anesthetics, amide; Penicillins; Buprenorphine hcl; Encainide; and Metoclopramide   Review of Systems Review of Systems  Constitutional: Positive for activity change.  Respiratory: Negative for shortness of breath.   Cardiovascular: Negative for chest pain.  Gastrointestinal: Positive for abdominal pain, nausea and vomiting.  All other systems reviewed and are negative.    Physical Exam Updated Vital Signs BP (!) 143/62 (BP Location: Right Arm)   Pulse (!)  104   Temp 98.7 F (37.1 C) (Oral)   Resp 16   Ht 5\' 2"  (1.575 m)   Wt 59.6 kg (131 lb 6.3 oz)   LMP 05/01/2017 (Approximate)   SpO2 100%   BMI 24.03 kg/m   Physical Exam  Constitutional: She is oriented to person, place, and time. She appears well-developed.  HENT:  Head: Normocephalic and atraumatic.  Eyes: EOM are normal.  Neck: Normal range of motion. Neck supple.  Cardiovascular: Normal rate.  Pulmonary/Chest: Effort normal.  Abdominal: Bowel sounds are normal. There is tenderness in the epigastric area. There is no rigidity, no rebound and no guarding.  Neurological: She is alert and oriented to person, place, and time.  Skin: Skin is warm and dry.  Nursing note and vitals reviewed.    ED Treatments / Results  Labs (all labs ordered are listed, but only abnormal results are displayed) Labs Reviewed  COMPREHENSIVE METABOLIC PANEL - Abnormal; Notable for the following components:      Result Value   Potassium 5.7 (*)    CO2 20 (*)    Glucose, Bld 344 (*)    Creatinine, Ser 1.20 (*)    AST 45 (*)    Total Bilirubin 2.4 (*)    GFR calc non Af Amer 52 (*)    All other components within normal limits  CBC - Abnormal; Notable for the following components:   WBC 14.4 (*)    RDW 20.0 (*)    Platelets 460 (*)    All other components within normal limits  URINALYSIS, ROUTINE W REFLEX MICROSCOPIC - Abnormal; Notable for the following components:   Color, Urine YELLOW (*)    APPearance HAZY (*)    Glucose, UA >=500 (*)    Hgb urine dipstick LARGE (*)    Ketones, ur 80 (*)    Protein, ur >=300 (*)    Leukocytes, UA SMALL (*)    Squamous Epithelial / LPF 6-30 (*)    All other components within normal limits  BASIC METABOLIC PANEL - Abnormal; Notable for the following components:   CO2 21 (*)    Glucose, Bld 186 (*)    Creatinine, Ser 1.35 (*)    GFR calc non Af Amer 45 (*)    GFR calc Af Amer 52 (*)    All other components within normal limits  BASIC METABOLIC  PANEL - Abnormal; Notable for the following components:   Glucose, Bld 146 (*)    Creatinine, Ser 1.46 (*)    GFR calc non Af Amer 41 (*)    GFR calc Af Amer 47 (*)    All other components within normal limits  CBC - Abnormal; Notable for the following components:   RDW 21.0 (*)    Platelets 404 (*)    All other components within normal limits  GLUCOSE, CAPILLARY - Abnormal; Notable for the following components:   Glucose-Capillary 333 (*)    All other components within normal limits  GLUCOSE, CAPILLARY - Abnormal; Notable for the following components:   Glucose-Capillary 238 (*)    All other components within normal limits  GLUCOSE, CAPILLARY - Abnormal; Notable for the following components:   Glucose-Capillary 115 (*)    All other components within normal limits  GLUCOSE, CAPILLARY - Abnormal; Notable for the following components:   Glucose-Capillary 57 (*)    All other components within normal limits  GLUCOSE, CAPILLARY - Abnormal; Notable for the following components:   Glucose-Capillary 158 (*)    All other components within normal limits  GLUCOSE, CAPILLARY - Abnormal; Notable for the following components:   Glucose-Capillary 151 (*)    All other components within normal limits  CBG MONITORING, ED - Abnormal; Notable for the following components:   Glucose-Capillary 321 (*)    All other components within normal limits  LIPASE, BLOOD    EKG  EKG Interpretation None       Radiology No results found.  Procedures Procedures (including critical care time)  Angiocath insertion Performed by: Nicolasa Milbrath  Consent: Verbal consent obtained. Risks and benefits: risks, benefits and alternatives were discussed Time out: Immediately prior to procedure a "  time out" was called to verify the correct patient, procedure, equipment, support staff and site/side marked as required.  Preparation: Patient was prepped and draped in the usual sterile fashion.  Vein Location:  left antecubital fossa  Ultrasound Guided  Gauge: 20  Normal blood return and flush without difficulty Patient tolerance: Patient tolerated the procedure well with no immediate complications.     Medications Ordered in ED Medications  LORazepam (ATIVAN) injection 1 mg (not administered)  promethazine (PHENERGAN) injection 6.25 mg (6.25 mg Intravenous Given 05/16/17 1512)  enoxaparin (LOVENOX) injection 40 mg (40 mg Subcutaneous Given 05/17/17 0038)  0.9 %  sodium chloride infusion ( Intravenous New Bag/Given 05/16/17 2250)  acetaminophen (TYLENOL) tablet 650 mg (not administered)    Or  acetaminophen (TYLENOL) suppository 650 mg (not administered)  ondansetron (ZOFRAN) tablet 4 mg (not administered)    Or  ondansetron (ZOFRAN) injection 4 mg (not administered)  insulin aspart (novoLOG) injection 0-9 Units (0 Units Subcutaneous Not Given 05/17/17 0008)  insulin detemir (LEVEMIR) injection 8 Units (8 Units Subcutaneous Given 05/17/17 0044)  lactated ringers bolus 1,000 mL (1,000 mLs Intravenous New Bag/Given 05/16/17 1229)  promethazine (PHENERGAN) injection 25 mg (25 mg Intravenous Given 05/16/17 1229)  LORazepam (ATIVAN) injection 1 mg (1 mg Intravenous Given 05/16/17 1229)  haloperidol lactate (HALDOL) injection 5 mg (5 mg Intravenous Given 05/16/17 1448)     Initial Impression / Assessment and Plan / ED Course  I have reviewed the triage vital signs and the nursing notes.  Pertinent labs & imaging results that were available during my care of the patient were reviewed by me and considered in my medical decision making (see chart for details).  Clinical Course as of May 17 808  Thu May 16, 2017  1458 Po challenge failed  [AN]    Clinical Course User Index [AN] Varney Biles, MD    DDx includes: Pancreatitis Hepatobiliary pathology including cholecystitis Gastritis/PUD SBO ACS syndrome  Patient comes in with chief complaint of abdominal pain, nausea,  vomiting. Pt has type 1 diabetes mellitus, recurrent DKA's, and recurrent gastroparesis flair. We will start hydration.  Patient will be given medications for gastroparesis.   Final Clinical Impressions(s) / ED Diagnoses   Final diagnoses:  Gastroparesis  [facility]    ED Discharge Orders    None       Varney Biles, MD 05/17/17 [place], [place], MD 05/17/17 [phone]

## 2017-05-16 NOTE — ED Triage Notes (Signed)
Comes from home, abdominal pain x1 week, gastroparesis dx 8 years ago, flare up 1 week ago, c/o vomit/nausea. No vomit past 2 days. No blood. Says she has blood in urine but starting period. No tenderness, pain 6/10 abdomen. VSS. 172/82 pulse 108 RR 20, hx diabetes cbg 332, has not taken insulin today.

## 2017-05-17 ENCOUNTER — Other Ambulatory Visit: Payer: Self-pay

## 2017-05-17 DIAGNOSIS — R824 Acetonuria: Secondary | ICD-10-CM

## 2017-05-17 DIAGNOSIS — E1040 Type 1 diabetes mellitus with diabetic neuropathy, unspecified: Secondary | ICD-10-CM

## 2017-05-17 LAB — BASIC METABOLIC PANEL WITH GFR
Anion gap: 8 (ref 5–15)
BUN: 15 mg/dL (ref 6–20)
CO2: 24 mmol/L (ref 22–32)
Calcium: 9.1 mg/dL (ref 8.9–10.3)
Chloride: 111 mmol/L (ref 101–111)
Creatinine, Ser: 1.46 mg/dL — ABNORMAL HIGH (ref 0.44–1.00)
GFR calc Af Amer: 47 mL/min — ABNORMAL LOW
GFR calc non Af Amer: 41 mL/min — ABNORMAL LOW
Glucose, Bld: 146 mg/dL — ABNORMAL HIGH (ref 65–99)
Potassium: 4.1 mmol/L (ref 3.5–5.1)
Sodium: 143 mmol/L (ref 135–145)

## 2017-05-17 LAB — CBC
HEMATOCRIT: 37.6 % (ref 36.0–46.0)
Hemoglobin: 12 g/dL (ref 12.0–15.0)
MCH: 26 pg (ref 26.0–34.0)
MCHC: 31.9 g/dL (ref 30.0–36.0)
MCV: 81.6 fL (ref 78.0–100.0)
Platelets: 404 10*3/uL — ABNORMAL HIGH (ref 150–400)
RBC: 4.61 MIL/uL (ref 3.87–5.11)
RDW: 21 % — AB (ref 11.5–15.5)
WBC: 10.4 10*3/uL (ref 4.0–10.5)

## 2017-05-17 LAB — GLUCOSE, CAPILLARY
Glucose-Capillary: 57 mg/dL — ABNORMAL LOW (ref 65–99)
Glucose-Capillary: 158 mg/dL — ABNORMAL HIGH (ref 65–99)
Glucose-Capillary: 151 mg/dL — ABNORMAL HIGH (ref 65–99)
Glucose-Capillary: 201 mg/dL — ABNORMAL HIGH (ref 65–99)
Glucose-Capillary: 80 mg/dL (ref 65–99)
Glucose-Capillary: 171 mg/dL — ABNORMAL HIGH (ref 65–99)

## 2017-05-17 MED ORDER — INSULIN ASPART 100 UNIT/ML ~~LOC~~ SOLN
0.0000 [IU] | Freq: Three times a day (TID) | SUBCUTANEOUS | Status: DC
  Administered 2017-05-18: 1 [IU] via SUBCUTANEOUS

## 2017-05-17 MED ORDER — SODIUM CHLORIDE 0.9 % IV SOLN: 250.0000 mg | Freq: Four times a day (QID) | INTRAVENOUS | Status: DC

## 2017-05-17 NOTE — Progress Notes (Signed)
Hypoglycemic Event  CBG: 57  Treatment: 240 ml Orange juice   Symptoms: none reported   Follow-up CBG time = 0630 CBG Result:= 158  Possible Reasons for Event: pt was given her Levemir 8units @ 0045 this morning   Comments/MD notified    Larence Penning

## 2017-05-17 NOTE — Care Management Note (Signed)
Case Management Note  Patient Details  Name: [name] MRN: [id number] Date of Birth: 20-Jul-1965  Subjective/Objective:                  dka  Action/Plan: Date: May 17, 2017 Velva Harman, BSN, [place], [place] Chart and notes review for patient progress and needs. Will follow for case management and discharge needs. Next review date: [id number]  Expected Discharge Date:  05/19/17               Expected Discharge Plan:  Home/Self Care  In-House Referral:     Discharge planning Services  CM Consult  Post Acute Care Choice:    Choice offered to:     DME Arranged:    DME Agency:     HH Arranged:    HH Agency:     Status of Service:  In process, will continue to follow  If discussed at Long Length of Stay Meetings, dates discussed:    Additional Comments:  Leeroy Cha, RN 05/17/2017, 8:51 AM

## 2017-05-17 NOTE — Progress Notes (Signed)
PROGRESS NOTE    [name]  WUJ:[id number] DOB: [birth year] DOA: 05/16/2017 PCP: [name], MD  Brief Narrative: [name] is a 51 y.o. female with medical history significant of DM, gastroparesis, depression who comes in with c/o vomiting.  She was just recently hospitalized from 11/12 to 11/15.  At that time she was started on erythromycin for gastroparesis.  Patient states compliance with this medication, reported recurrence of nausea/vomiting and came to ER last pm, got admitted, now NPO  Assessment & Plan:     Severe Diabetic gastroparesis -acute on chronic symptoms -started on Erythromycin last week -last gastric empyting scan in 09/2014 with severe gastroparesis -abd exam and labs benign, do not think she needs re-imaging at this time -supportive care, antiemetics, start clears -unfortunately she is allergic to Reglan and we do not have IV Erythromycin on formulary, when Po intake improves will need PO erythromycin added back -stop low dose Depakote -since this can cause severe Nausea too ( she is on this likely for mood d/o, no h/o seizures) -suspect significant psych overlay too -UA abnormal but not suggestive of UTI   DM type 1 -continue lower dose lantus, SSI  Anxiety/Depression -continue PRN Klonopin -restart Buspar and cogentin when able to tolerate Po -stopped low dose Depakote as above  Mild AKI -continue IVF today -Bmet in am  Hyperkalemia -likely hemolyzed specimen, resolved  DVT prophylaxis: lovenox Code Status: full Family Communication: none at bedside Disposition Plan: pending  Consultants:     Procedures:   Antimicrobials:    Subjective: Still with nausea, no vomiting yet this am  Objective: Vitals:   05/16/17 1630 05/16/17 1754 05/16/17 1951 05/17/17 0521  BP: (!) 178/83 (!) 155/75 103/60 (!) 143/62  Pulse: (!) 113 (!) 115 (!) 109 (!) 104  Resp: 18 20 16 16   Temp:   98.4 F (36.9 C) 98.7 F (37.1 C)  TempSrc:   Oral Oral    SpO2: 100% 100% 99% 100%  Weight:  59.6 kg (131 lb 6.3 oz)    Height:  5\' 2"  (1.575 m)      Intake/Output Summary (Last 24 hours) at 05/17/2017 1209 Last data filed at 05/17/2017 1000 Gross per 24 hour  Intake 614.17 ml  Output -  Net 614.17 ml   Filed Weights   05/16/17 1754  Weight: 59.6 kg (131 lb 6.3 oz)    Examination:  General exam: Appears calm but uncomfortable  Respiratory system: Clear to auscultation. Respiratory effort normal. Cardiovascular system: S1 & S2 heard, RRR. No JVD, murmurs, rubs, gallops Gastrointestinal system: Abdomen is nondistended, soft and nontender.Normal bowel sounds heard. Central nervous system: Alert and oriented. No focal neurological deficits. Extremities: Symmetric 5 x 5 power. Skin: No rashes, lesions or ulcers Psychiatry: flat affect, depressed    Data Reviewed:   CBC: Recent Labs  Lab 05/16/17 1200 05/17/17 0604  WBC 14.4* 10.4  HGB 13.1 12.0  HCT 38.6 37.6  MCV 79.1 81.6  PLT 460* 956*   Basic Metabolic Panel: Recent Labs  Lab 05/16/17 1200 05/16/17 2128 05/17/17 0604  NA 136 140 143  K 5.7* 3.7 4.1  CL 101 108 111  CO2 20* 21* 24  GLUCOSE 344* 186* 146*  BUN 10 12 15   CREATININE 1.20* 1.35* 1.46*  CALCIUM 9.4 9.2 9.1   GFR: Estimated Creatinine Clearance: 36.5 mL/min (A) (by C-G formula based on SCr of 1.46 mg/dL (H)). Liver Function Tests: Recent Labs  Lab 05/16/17 1200  AST 45*  ALT 17  ALKPHOS 89  BILITOT 2.4*  PROT 8.1  ALBUMIN 3.9   Recent Labs  Lab 05/16/17 1200  LIPASE 16   No results for input(s): AMMONIA in the last 168 hours. Coagulation Profile: No results for input(s): INR, PROTIME in the last 168 hours. Cardiac Enzymes: No results for input(s): CKTOTAL, CKMB, CKMBINDEX, TROPONINI in the last 168 hours. BNP (last 3 results) No results for input(s): PROBNP in the last 8760 hours. HbA1C: No results for input(s): HGBA1C in the last 72 hours. CBG: Recent Labs  Lab 05/17/17 0002  05/17/17 0459 05/17/17 0628 05/17/17 0737 05/17/17 1136  GLUCAP 115* 57* 158* 151* 201*   Lipid Profile: No results for input(s): CHOL, HDL, LDLCALC, TRIG, CHOLHDL, LDLDIRECT in the last 72 hours. Thyroid Function Tests: No results for input(s): TSH, T4TOTAL, FREET4, T3FREE, THYROIDAB in the last 72 hours. Anemia Panel: No results for input(s): VITAMINB12, FOLATE, FERRITIN, TIBC, IRON, RETICCTPCT in the last 72 hours. Urine analysis:    Component Value Date/Time   COLORURINE YELLOW (A) 05/16/2017 1230   APPEARANCEUR HAZY (A) 05/16/2017 1230   LABSPEC 1.022 05/16/2017 1230   PHURINE 6.0 05/16/2017 1230   GLUCOSEU >=500 (A) 05/16/2017 1230   HGBUR LARGE (A) 05/16/2017 1230   BILIRUBINUR NEGATIVE 05/16/2017 1230   BILIRUBINUR neg 01/17/2017 1533   KETONESUR 80 (A) 05/16/2017 1230   PROTEINUR >=300 (A) 05/16/2017 1230   UROBILINOGEN 0.2 01/17/2017 1533   UROBILINOGEN 0.2 04/27/2015 1608   NITRITE NEGATIVE 05/16/2017 1230   LEUKOCYTESUR SMALL (A) 05/16/2017 1230   Sepsis Labs: @LABRCNTIP (procalcitonin:4,lacticidven:4)  )No results found for this or any previous visit (from the past 240 hour(s)).       Radiology Studies: No results found.      Scheduled Meds: . enoxaparin (LOVENOX) injection  40 mg Subcutaneous Q24H  . insulin aspart  0-9 Units Subcutaneous Q4H  . insulin detemir  8 Units Subcutaneous BID   Continuous Infusions: . sodium chloride 50 mL/hr at 05/17/17 0905     LOS: 1 day    Time spent: 69min    Domenic Polite, MD [facility] Page via [email or web address], password [email or web address] After 7PM please contact night-coverage  05/17/2017, 12:09 PM

## 2017-05-17 NOTE — Progress Notes (Signed)
Patient requesting food. Denies abdominal pain or nausea.  Dr. Broadus John notified via text page.

## 2017-05-17 NOTE — Progress Notes (Signed)
Patient refused bath at this time. Stated she was advanced to clear liquid diet and will do after she eats. RN aware.

## 2017-05-18 DIAGNOSIS — F329 Major depressive disorder, single episode, unspecified: Secondary | ICD-10-CM

## 2017-05-18 DIAGNOSIS — F419 Anxiety disorder, unspecified: Secondary | ICD-10-CM

## 2017-05-18 DIAGNOSIS — E1143 Type 2 diabetes mellitus with diabetic autonomic (poly)neuropathy: Secondary | ICD-10-CM

## 2017-05-18 DIAGNOSIS — K3184 Gastroparesis: Secondary | ICD-10-CM

## 2017-05-18 LAB — BASIC METABOLIC PANEL
Anion gap: 4 — ABNORMAL LOW (ref 5–15)
BUN: 14 mg/dL (ref 6–20)
CALCIUM: 8.3 mg/dL — AB (ref 8.9–10.3)
CO2: 21 mmol/L — ABNORMAL LOW (ref 22–32)
CREATININE: 0.94 mg/dL (ref 0.44–1.00)
Chloride: 111 mmol/L (ref 101–111)
Glucose, Bld: 120 mg/dL — ABNORMAL HIGH (ref 65–99)
Potassium: 4.2 mmol/L (ref 3.5–5.1)
SODIUM: 136 mmol/L (ref 135–145)

## 2017-05-18 LAB — GLUCOSE, CAPILLARY
GLUCOSE-CAPILLARY: 136 mg/dL — AB (ref 65–99)
GLUCOSE-CAPILLARY: 110 mg/dL — AB (ref 65–99)
Glucose-Capillary: 199 mg/dL — ABNORMAL HIGH (ref 65–99)

## 2017-05-18 MED ORDER — ERYTHROMYCIN BASE 500 MG PO TABS: 250.0000 mg | ORAL_TABLET | Freq: Three times a day (TID) | ORAL | 1 refills | Status: DC

## 2017-05-18 MED ORDER — BUSPIRONE HCL 10 MG PO TABS: 10.0000 mg | ORAL_TABLET | Freq: Three times a day (TID) | ORAL | 2 refills | Status: DC

## 2017-05-18 MED ORDER — ONDANSETRON 8 MG PO TBDP: 8.0000 mg | ORAL_TABLET | Freq: Three times a day (TID) | ORAL | 0 refills | Status: DC | PRN

## 2017-05-18 MED ORDER — FUROSEMIDE 20 MG PO TABS: 20.0000 mg | ORAL_TABLET | Freq: Every day | ORAL | 0 refills | Status: DC | PRN

## 2017-05-18 MED ORDER — INSULIN DETEMIR 100 UNIT/ML FLEXPEN: 12.0000 [IU] | PEN_INJECTOR | Freq: Two times a day (BID) | SUBCUTANEOUS | 11 refills | Status: DC

## 2017-05-18 NOTE — Discharge Summary (Signed)
[name], is a 51 y.o. female  DOB 05/10/1966  MRN [id number].  Admission date:  05/16/2017  Admitting Physician  Geradine Girt, DO  Discharge Date:  05/18/2017   Primary MD  [name], MD  Recommendations for primary care physician for things to follow:   Follow-up with psychiatrist within a week, repeat BMP with PCP within 2 weeks  Admission Diagnosis  Abdominal Pain   Discharge Diagnosis  Abdominal Pain    Active Problems:   Diabetic neuropathy, type I diabetes mellitus ([place])   Anxiety and depression   Diabetic gastroparesis ([place])   Noncompliance with treatment plan      Past Medical History:  Diagnosis Date  . Depression   . Diabetes mellitus without complication ([place])   . Essential hypertension   . Gastroparesis   . GERD (gastroesophageal reflux disease)   . HLD (hyperlipidemia)     Past Surgical History:  Procedure Laterality Date  . COLONOSCOPY  09/27/2014   at [facility]  . ESOPHAGOGASTRODUODENOSCOPY  09/27/2014   at [facility], Dr Rolan Lipa. biospy neg for celiac, neg for H pylori.   . EYE SURGERY    . gailstones    . IR FLUORO GUIDE CV LINE RIGHT  02/01/2017  . IR FLUORO GUIDE CV LINE RIGHT  03/06/2017  . IR FLUORO GUIDE CV LINE RIGHT  03/25/2017  . IR GENERIC HISTORICAL  01/24/2016   IR FLUORO GUIDE CV LINE RIGHT 01/24/2016 Darrell K Allred, PA-C [facility]  . IR GENERIC HISTORICAL  01/24/2016   IR US GUIDE VASC ACCESS RIGHT 01/24/2016 Darrell K Allred, PA-C [facility]  . IR US GUIDE VASC ACCESS RIGHT  02/01/2017  . IR US GUIDE VASC ACCESS RIGHT  03/06/2017  . IR US GUIDE VASC ACCESS RIGHT  03/25/2017  . POSTERIOR VITRECTOMY AND MEMBRANE PEEL-LEFT EYE  09/28/2002  . POSTERIOR VITRECTOMY AND MEMBRANE PEEL-RIGHT EYE  03/16/2002  . RETINAL DETACHMENT SURGERY         HPI  from the history and physical done on the day of admission:     Chief Complaint: flare of my  gastroparesis  HPI: [name] is a 51 y.o. female with medical history significant of DM, gastroparesis, depression who comes in with c/o vomiting.  She was just recently hospitalized from 11/12 to 11/15.  At that time she was started on erythromycin for gastroparesis.  Patient states compliance with this medication.  She is currently spitting into an emesis bag/holdin secretions in her mouth and then swallowing.  No current vomiting.  Most further history obtained from chart as patient would only occasionally answer questions, but did deny sore throat.    IN the ER, she was given ativan, haldol and was unable to tolerate a PO challenge as she continued to "spit".       Hospital Course:     Brief Narrative: Uma Jerde a 51 y.o.femalewith medical history significantof DM, gastroparesis, depression who comes in with c/o vomiting. She was just recently hospitalized from 11/12 to 11/15. At  that time she was started on erythromycin for gastroparesis. Patient states compliance with this medication, reported recurrence of nausea/vomiting and came to ER last pm, got admitted  Assessment & Plan:    1)Severe Diabetic Gastroparesis-  acute on chronic symptoms, improved now, patient is tolerating oral intake, okay to restart Erythromycin, prior gastric empyting scan in 09/2014 with severe gastroparesis. unfortunately she is allergic to Reglan .  Patient would like to stop Depakote which was prescribed for mood disorder-since this can cause severe Nausea too    2)DM Type 1- okay to resume home insulin regimen as patient is tolerating Oral intake   3)Anxiety/Depression- -continue PRN Klonopin, Increase Buspar to 10 mg TID and c/n  cogentin stopped low dose Depakote as above  4)AKI- it was due to persistent nausea and vomiting, now resolved with hydration, creatinine is down to 0.94 from 1.46  5)Hyperkalemia- potassium is down to 4.2 from 5.7    Discharge Condition: stable  Follow  UP- psychiatrist within a week and repeat BMP with PCP within 2 weeks  Diet and Activity recommendation:  As advised  Discharge Instructions    Discharge Instructions    Call MD for:  difficulty breathing, headache or visual disturbances   Complete by:  As directed    Call MD for:  difficulty breathing, headache or visual disturbances   Complete by:  As directed    Call MD for:  persistant dizziness or light-headedness   Complete by:  As directed    Call MD for:  persistant dizziness or light-headedness   Complete by:  As directed    Call MD for:  persistant nausea and vomiting   Complete by:  As directed    Call MD for:  persistant nausea and vomiting   Complete by:  As directed    Call MD for:  severe uncontrolled pain   Complete by:  As directed    Call MD for:  severe uncontrolled pain   Complete by:  As directed    Call MD for:  temperature >100.4   Complete by:  As directed    Call MD for:  temperature >100.4   Complete by:  As directed    Diet Carb Modified   Complete by:  As directed    Discharge instructions   Complete by:  As directed    1)Eat Small frequent meals 2)Push liquids/fluids 3)Avoid ibuprofen/Advil/Aleve/Motrin/Goody Powders/Naproxen/BC powders as these will make you more likely to bleed and can cause stomach ulcers 4)Take medications as prescribed 5)Follow-up with psychiatrist within a week follow-up with PCP within 2 weeks (Repeat BMP test with PCP within 2 weeks)   Increase activity slowly   Complete by:  As directed         Discharge Medications     Allergies as of 05/18/2017      Reactions   Anesthetics, Amide Nausea And Vomiting   Penicillins Diarrhea, Nausea And Vomiting   Has patient had a PCN reaction causing immediate rash, facial/tongue/throat swelling, SOB or lightheadedness with hypotension: Yes Has patient had a PCN reaction causing severe rash involving mucus membranes or skin necrosis: No Has patient had a PCN reaction that  required hospitalization No Has patient had a PCN reaction occurring within the last 10 years: Yes  If all of the above answers are "NO", then may proceed with Cephalosporin use.   Buprenorphine Hcl Rash   Encainide Nausea And Vomiting   Metoclopramide Other (See Comments)   Dystonia, muscle rigidity.  Medication List    STOP taking these medications   divalproex 125 MG capsule Commonly known as:  DEPAKOTE SPRINKLE     TAKE these medications   benztropine 1 MG tablet Commonly known as:  COGENTIN Take 1 tablet (1 mg total) by mouth 2 (two) times daily.   busPIRone 10 MG tablet Commonly known as:  BUSPAR Take 1 tablet (10 mg total) by mouth 3 (three) times daily. What changed:    medication strength  how much to take   clonazePAM 0.5 MG disintegrating tablet Commonly known as:  KLONOPIN Take 1 tablet (0.5 mg total) by mouth 2 (two) times daily.   erythromycin base 500 MG tablet Commonly known as:  E-MYCIN Take 0.5 tablets (250 mg total) by mouth 3 (three) times daily after meals. What changed:  when to take this   finasteride 5 MG tablet Commonly known as:  PROSCAR Take 1 tablet (5 mg total) by mouth daily.   furosemide 20 MG tablet Commonly known as:  LASIX Take 1 tablet (20 mg total) by mouth daily as needed for fluid or edema (as needed for swelling). What changed:    medication strength  how much to take  reasons to take this   Insulin Detemir 100 UNIT/ML Pen Commonly known as:  LEVEMIR FLEXPEN Inject 12 Units into the skin 2 (two) times daily.   NOVOLOG FLEXPEN 100 UNIT/ML FlexPen Generic drug:  insulin aspart Inject as per sliding scale subcutaneously before meals and at bedtime 70 - 119 = 0 units 120 - 150 = 2 units 151 - 180 = 4 units 181 - 210 = 5 units Greater than 400 notify MD 211 - 240 = 6 units 241 - 270 = 8 units 271 - 300 = 9 units 301 - 330 = 10 units 331 - 360 = 11 units 361 - 400 = 12 units   ondansetron 8 MG  disintegrating tablet Commonly known as:  ZOFRAN-ODT Take 1 tablet (8 mg total) by mouth every 8 (eight) hours as needed for nausea or vomiting. What changed:    when to take this  reasons to take this   pantoprazole 40 MG tablet Commonly known as:  PROTONIX Take 1 tablet (40 mg total) 2 (two) times daily by mouth.   polyethylene glycol packet Commonly known as:  MIRALAX / GLYCOLAX Take 17 g by mouth daily as needed for mild constipation.   pregabalin 150 MG capsule Commonly known as:  LYRICA Take 150 mg 2 (two) times daily by mouth.   pyridostigmine 60 MG tablet Commonly known as:  MESTINON Take 30 mg by mouth 3 (three) times daily.   sodium bicarbonate 650 MG tablet Take 1 tablet (650 mg total) daily by mouth.       Major procedures and Radiology Reports - PLEASE review detailed and final reports for all details, in brief -     No results found.  Micro Results    No results found for this or any previous visit (from the past 240 hour(s)).     Today   Subjective    Shadai Ytuarte today has no new complaints, tolerating oral intake well, no fevers, no significant abdominal pain, no further emesis .  Patient had a soft BM         Patient has been seen and examined prior to discharge   Objective   Blood pressure 137/63, pulse 81, temperature 97.9 F (36.6 C), temperature source Oral, resp. rate 19, height 5\' 2"  (1.575 m),  weight 59.6 kg (131 lb 6.3 oz), last menstrual period 05/01/2017, SpO2 99 %.   Intake/Output Summary (Last 24 hours) at 05/18/2017 1321 Last data filed at 05/18/2017 1000 Gross per 24 hour  Intake 1080 ml  Output 1 ml  Net 1079 ml    Exam Gen:- Awake  In no apparent distress  HEENT:- [place].AT,   Neck-Supple Neck,No JVD,  Lungs- mostly clear  CV- S1, S2 normal Abd-  +ve B.Sounds, Abd Soft, No tenderness,    Extremity/Skin:- Intact peripheral pulses   Psych-affect is appropriate   Data Review   CBC w Diff:  Lab Results    Component Value Date   WBC 10.4 05/17/2017   HGB 12.0 05/17/2017   HCT 37.6 05/17/2017   HCT REJ5 03/23/2016   PLT 404 (H) 05/17/2017   LYMPHOPCT 6 05/06/2017   MONOPCT 3 05/06/2017   EOSPCT 0 05/06/2017   BASOPCT 0 05/06/2017    CMP:  Lab Results  Component Value Date   NA 136 05/18/2017   NA 138 04/15/2017   K 4.2 05/18/2017   CL 111 05/18/2017   CO2 21 (L) 05/18/2017   BUN 14 05/18/2017   BUN 14 04/15/2017   CREATININE 0.94 05/18/2017   CREATININE 1.11 (H) 09/04/2016   GLU 230 04/15/2017   PROT 8.1 05/16/2017   ALBUMIN 3.9 05/16/2017   BILITOT 2.4 (H) 05/16/2017   ALKPHOS 89 05/16/2017   AST 45 (H) 05/16/2017   ALT 17 05/16/2017  . Total Discharge time is about 31 minutes  Roxan Hockey M.D on 05/18/2017 at 1:21 PM  [facility]   Office  [phone]  Voice Recognition Viviann Spare dictation system was used to create this note, attempts have been made to correct errors. Please contact the author with questions and/or clarifications.

## 2017-05-18 NOTE — Progress Notes (Signed)
Discharge instructions and medications discussed with patient.  Prescriptions and AVS given to patient.  All questions answered.  Patient waiting on social work for help with transportation home.

## 2017-05-20 ENCOUNTER — Inpatient Hospital Stay: Payer: Self-pay | Admitting: Family Medicine

## 2017-05-20 ENCOUNTER — Telehealth: Payer: Self-pay

## 2017-05-20 NOTE — Telephone Encounter (Signed)
[facility] Post-discharge Follow-Up Phone Call: The patient is known to [facility] and was discharged from the hospital on 05/18/2017.  She was a no show to her appointment today with Dr Jarold Song. Attempted to contact her to check on her and to discuss scheduling another appointment.  Call placed to [phone] (M) and a HIPAA compliant voicemail message was left requesting a call back to # [phone]

## 2017-05-21 ENCOUNTER — Telehealth: Payer: Self-pay

## 2017-05-21 NOTE — Telephone Encounter (Signed)
[facility] Post-discharge Follow-Up Phone Call:  The patient is known to [facility] and was discharged from the hospital on 05/18/2017.  Attempted to contact her to check on her and to discuss scheduling a follow up  appointment.  Call placed to [phone] (M) and a HIPAA compliant voicemail message was left requesting a call back to # [phone]

## 2017-05-22 ENCOUNTER — Telehealth: Payer: Self-pay | Admitting: Family Medicine

## 2017-05-22 NOTE — Telephone Encounter (Signed)
Call placed to patient [phone] to check on her status and to schedule her an appointment. No answer. Left patient a message to return my call at [phone].

## 2017-05-24 ENCOUNTER — Inpatient Hospital Stay: Payer: Self-pay | Admitting: Family Medicine

## 2017-05-28 ENCOUNTER — Telehealth: Payer: Self-pay | Admitting: Family Medicine

## 2017-05-28 NOTE — Telephone Encounter (Signed)
Call placed to patient [phone] to check on her status and reschedule an appointment. Phone rang a couple of times and then the call dropped. Unable to reach patient.

## 2017-05-31 NOTE — Telephone Encounter (Signed)
Call placed to patient [phone] to check on her status but had no luck. Phone rings several times and then the call drops.

## 2017-06-03 ENCOUNTER — Encounter (HOSPITAL_COMMUNITY): Payer: Self-pay | Admitting: Emergency Medicine

## 2017-06-03 ENCOUNTER — Emergency Department (HOSPITAL_COMMUNITY)
Admission: EM | Admit: 2017-06-03 | Discharge: 2017-06-03 | Disposition: A | Discharge status: Home / Self Care | Payer: Medicaid Other | Attending: Emergency Medicine | Admitting: Emergency Medicine

## 2017-06-03 DIAGNOSIS — Z79899 Other long term (current) drug therapy: Secondary | ICD-10-CM | POA: Diagnosis not present

## 2017-06-03 DIAGNOSIS — I10 Essential (primary) hypertension: Secondary | ICD-10-CM | POA: Diagnosis not present

## 2017-06-03 DIAGNOSIS — E1043 Type 1 diabetes mellitus with diabetic autonomic (poly)neuropathy: Secondary | ICD-10-CM | POA: Diagnosis not present

## 2017-06-03 DIAGNOSIS — E1143 Type 2 diabetes mellitus with diabetic autonomic (poly)neuropathy: Secondary | ICD-10-CM

## 2017-06-03 DIAGNOSIS — R112 Nausea with vomiting, unspecified: Secondary | ICD-10-CM | POA: Diagnosis present

## 2017-06-03 DIAGNOSIS — K3184 Gastroparesis: Secondary | ICD-10-CM

## 2017-06-03 LAB — COMPREHENSIVE METABOLIC PANEL
ALT: 15 U/L (ref 14–54)
AST: 18 U/L (ref 15–41)
Albumin: 3.6 g/dL (ref 3.5–5.0)
Alkaline Phosphatase: 81 U/L (ref 38–126)
Anion gap: 15 (ref 5–15)
BUN: 18 mg/dL (ref 6–20)
CO2: 18 mmol/L — ABNORMAL LOW (ref 22–32)
Calcium: 9.2 mg/dL (ref 8.9–10.3)
Chloride: 99 mmol/L — ABNORMAL LOW (ref 101–111)
Creatinine, Ser: 0.98 mg/dL (ref 0.44–1.00)
GFR calc Af Amer: >60 mL/min (ref >60)
GFR calc non Af Amer: >60 mL/min (ref >60)
Glucose, Bld: 291 mg/dL — ABNORMAL HIGH (ref 65–99)
Potassium: 4.1 mmol/L (ref 3.5–5.1)
Sodium: 132 mmol/L — ABNORMAL LOW (ref 135–145)
Total Bilirubin: 1.8 mg/dL — ABNORMAL HIGH (ref 0.3–1.2)
Total Protein: 7.4 g/dL (ref 6.5–8.1)

## 2017-06-03 LAB — URINALYSIS, ROUTINE W REFLEX MICROSCOPIC
Bilirubin Urine: NEGATIVE
Glucose, UA: 150 mg/dL — AB
Hgb urine dipstick: NEGATIVE
Ketones, ur: 80 mg/dL — AB
Leukocytes, UA: NEGATIVE
Nitrite: NEGATIVE
Protein, ur: 100 mg/dL — AB
Specific Gravity, Urine: 1.023 (ref 1.005–1.030)
pH: 5 (ref 5.0–8.0)

## 2017-06-03 LAB — BLOOD GAS, VENOUS
ACID-BASE DEFICIT: 11.4 mmol/L — AB (ref 0.0–2.0)
Bicarbonate: 12 mmol/L — ABNORMAL LOW (ref 20.0–28.0)
FIO2: 21
O2 SAT: 97.6 %
PH VEN: 7.37 (ref 7.250–7.430)
Patient temperature: 98.6
pCO2, Ven: 21.2 mmHg — ABNORMAL LOW (ref 44.0–60.0)
pO2, Ven: 115 mmHg — ABNORMAL HIGH (ref 32.0–45.0)

## 2017-06-03 LAB — CBC WITH DIFFERENTIAL/PLATELET
Basophils Absolute: 0 10*3/uL (ref 0.0–0.1)
Basophils Relative: 0 %
Eosinophils Absolute: 0 10*3/uL (ref 0.0–0.7)
Eosinophils Relative: 1 %
HCT: 37.7 % (ref 36.0–46.0)
Hemoglobin: 12.7 g/dL (ref 12.0–15.0)
Lymphocytes Relative: 20 %
Lymphs Abs: 1.4 10*3/uL (ref 0.7–4.0)
MCH: 27 pg (ref 26.0–34.0)
MCHC: 33.7 g/dL (ref 30.0–36.0)
MCV: 80.2 fL (ref 78.0–100.0)
Monocytes Absolute: 0.4 10*3/uL (ref 0.1–1.0)
Monocytes Relative: 5 %
Neutro Abs: 5.2 10*3/uL (ref 1.7–7.7)
Neutrophils Relative %: 74 %
Platelets: 121 10*3/uL — ABNORMAL LOW (ref 150–400)
RBC: 4.7 MIL/uL (ref 3.87–5.11)
RDW: 18.8 % — ABNORMAL HIGH (ref 11.5–15.5)
WBC: 7.1 10*3/uL (ref 4.0–10.5)

## 2017-06-03 LAB — LIPASE, BLOOD: Lipase: 16 U/L (ref 11–51)

## 2017-06-03 MED ORDER — ONDANSETRON HCL 4 MG/2ML IJ SOLN
4.0000 mg | Freq: Once | INTRAMUSCULAR | Status: DC
  Filled 2017-06-03: qty 2

## 2017-06-03 MED ORDER — HALOPERIDOL LACTATE 5 MG/ML IJ SOLN
5.0000 mg | Freq: Once | INTRAMUSCULAR | Status: AC
  Administered 2017-06-03: 5 mg via INTRAMUSCULAR

## 2017-06-03 MED ORDER — SODIUM CHLORIDE 0.9 % IV BOLUS (SEPSIS): 1000.0000 mL | Freq: Once | INTRAVENOUS | Status: DC

## 2017-06-03 MED ORDER — ONDANSETRON 4 MG PO TBDP
4.0000 mg | ORAL_TABLET | Freq: Once | ORAL | Status: AC
  Administered 2017-06-03: 4 mg via ORAL
  Filled 2017-06-03: qty 1

## 2017-06-03 MED ORDER — HALOPERIDOL LACTATE 5 MG/ML IJ SOLN
5.0000 mg | Freq: Once | INTRAMUSCULAR | Status: DC
  Filled 2017-06-03: qty 1

## 2017-06-03 MED ORDER — LORAZEPAM 1 MG PO TABS
1.0000 mg | ORAL_TABLET | Freq: Once | ORAL | Status: AC
  Administered 2017-06-03: 1 mg via ORAL
  Filled 2017-06-03: qty 1

## 2017-06-03 MED ORDER — ONDANSETRON 8 MG PO TBDP: 8.0000 mg | ORAL_TABLET | Freq: Three times a day (TID) | ORAL | 0 refills | Status: DC | PRN

## 2017-06-03 NOTE — ED Notes (Signed)
IV consult team notified per pt request

## 2017-06-03 NOTE — ED Provider Notes (Signed)
[facility] DEPT Provider Note   CSN: [id number] Arrival date & time: 06/03/17  0957     History   Chief Complaint Chief Complaint  Patient presents with  . Abdominal Pain  . Emesis    HPI [name] is a 51 y.o. female with history of type 1 diabetes, gastroparesis who presents with a 1 day history of abdominal pain and nausea vomiting.  She has also had dysuria.  States she feels that she has a urinary tract infection.  Patient has not been able to keep her home medications down.  She has tried taking Zofran and erythromycin that she typically takes for gastroparesis.  She denies any fevers, chest pain, shortness of breath, diarrhea.   HPI  Past Medical History:  Diagnosis Date  . Depression   . Diabetes mellitus without complication ([place])   . Essential hypertension   . Gastroparesis   . GERD (gastroesophageal reflux disease)   . HLD (hyperlipidemia)     Patient Active Problem List   Diagnosis Date Noted  . Gastroparesis 05/16/2017  . Depression   . DKA (diabetic ketoacidoses) ([place]) 05/06/2017  . Severe protein-calorie malnutrition ([place]) 04/17/2017  . Acute dystonic reaction due to drugs 04/01/2017  . Serotonin syndrome 03/25/2017  . Tardive dyskinesia   . Tachycardia 03/16/2017  . Diarrhea 03/05/2017  . DKA, type 1 ([place]) 03/18/2016  . Noncompliance with treatment plan 03/13/2016  . Essential hypertension 01/24/2016  . Abnormal ECG 11/27/2015  . HLD (hyperlipidemia)   . GERD (gastroesophageal reflux disease)   . Anemia, iron deficiency   . DKA, type 1, not at goal [facility])   . Vitamin B12 deficiency 08/16/2015  . Diabetic gastroparesis ([place])   . Diabetic neuropathy, type I diabetes mellitus ([place]) 05/18/2015  . Microcytic anemia 05/18/2015  . Anxiety and depression 05/18/2015    Past Surgical History:  Procedure Laterality Date  . COLONOSCOPY  09/27/2014   at [facility]  . ESOPHAGOGASTRODUODENOSCOPY  09/27/2014   at [facility], Dr Rolan Lipa. biospy neg for celiac, neg for H pylori.   . EYE SURGERY    . gailstones    . IR FLUORO GUIDE CV LINE RIGHT  02/01/2017  . IR FLUORO GUIDE CV LINE RIGHT  03/06/2017  . IR FLUORO GUIDE CV LINE RIGHT  03/25/2017  . IR GENERIC HISTORICAL  01/24/2016   IR FLUORO GUIDE CV LINE RIGHT 01/24/2016 Darrell K Allred, PA-C [facility]-INTER[facility] RAD  . IR GENERIC HISTORICAL  01/24/2016   IR US GUIDE VASC ACCESS RIGHT 01/24/2016 Darrell K Allred, PA-C [facility]  . IR US GUIDE VASC ACCESS RIGHT  02/01/2017  . IR US GUIDE VASC ACCESS RIGHT  03/06/2017  . IR US GUIDE VASC ACCESS RIGHT  03/25/2017  . POSTERIOR VITRECTOMY AND MEMBRANE PEEL-LEFT EYE  09/28/2002  . POSTERIOR VITRECTOMY AND MEMBRANE PEEL-RIGHT EYE  03/16/2002  . RETINAL DETACHMENT SURGERY      OB History    No data available       Home Medications    Prior to Admission medications   Medication Sig Start Date End Date Taking? Authorizing Provider  benztropine (COGENTIN) 1 MG tablet Take 1 tablet (1 mg total) by mouth 2 (two) times daily. 04/03/17  Yes Dessa Phi, DO  busPIRone (BUSPAR) 10 MG tablet Take 1 tablet (10 mg total) by mouth 3 (three) times daily. 05/18/17  Yes Emokpae, Courage, MD  clonazePAM (KLONOPIN) 0.5 MG disintegrating tablet Take 1 tablet (0.5 mg total) by mouth 2 (two) times  daily. 04/15/17  Yes Gerlene Fee, NP  erythromycin base (E-MYCIN) 500 MG tablet Take 0.5 tablets (250 mg total) by mouth 3 (three) times daily after meals. 05/18/17 05/18/18 Yes Emokpae, Courage, MD  finasteride (PROSCAR) 5 MG tablet Take 1 tablet (5 mg total) by mouth daily. 04/04/17  Yes Dessa Phi, DO  furosemide (LASIX) 20 MG tablet Take 1 tablet (20 mg total) by mouth daily as needed for fluid or edema (as needed for swelling). 05/18/17  Yes Emokpae, Courage, MD  insulin aspart (NOVOLOG FLEXPEN) 100 UNIT/ML FlexPen 6 Units. Inject as per sliding scale subcutaneously before meals and at bedtime 70 - 119 = 0 units 120 - 150 = 2 units 151 -  180 = 4 units 181 - 210 = 5 units Greater than 400 notify MD 211 - 240 = 6 units 241 - 270 = 8 units 271 - 300 = 9 units 301 - 330 = 10 units 331 - 360 = 11 units 361 - 400 = 12 units    Yes [provider]  Insulin Detemir (LEVEMIR FLEXPEN) 100 UNIT/ML Pen Inject 12 Units into the skin 2 (two) times daily. 05/18/17  Yes Emokpae, Courage, MD  pantoprazole (PROTONIX) 40 MG tablet Take 1 tablet (40 mg total) 2 (two) times daily by mouth. 05/09/17  Yes Barton Dubois, MD  polyethylene glycol (MIRALAX / GLYCOLAX) packet Take 17 g by mouth daily as needed for mild constipation.  04/11/17  Yes [provider]  pregabalin (LYRICA) 150 MG capsule Take 150 mg 2 (two) times daily by mouth. 05/05/15  Yes [provider]  sodium bicarbonate 650 MG tablet Take 1 tablet (650 mg total) daily by mouth. 05/10/17  Yes Barton Dubois, MD  ondansetron (ZOFRAN-ODT) 8 MG disintegrating tablet Take 1 tablet (8 mg total) by mouth every 8 (eight) hours as needed for nausea or vomiting. 06/03/17   Stepahnie Campo, Bea Graff, PA-C  pyridostigmine (MESTINON) 60 MG tablet Take 30 mg by mouth 3 (three) times daily.     [provider]    Family History Family History  Problem Relation Age of Onset  . Cystic fibrosis Mother   . Hypertension Father   . Diabetes Brother   . Hypertension Maternal Grandmother     Social History Social History   Tobacco Use  . Smoking status: Never Smoker  . Smokeless tobacco: Never Used  Substance Use Topics  . Alcohol use: No  . Drug use: No     Allergies   Anesthetics, amide; Penicillins; Buprenorphine hcl; Encainide; and Metoclopramide   Review of Systems Review of Systems  Constitutional: Negative for chills and fever.  HENT: Negative for facial swelling and sore throat.   Respiratory: Negative for shortness of breath.   Cardiovascular: Negative for chest pain.  Gastrointestinal: Positive for nausea and vomiting. Negative for abdominal  pain and diarrhea.  Genitourinary: Positive for dysuria.  Musculoskeletal: Negative for back pain.  Skin: Negative for rash and wound.  Neurological: Negative for headaches.  Psychiatric/Behavioral: The patient is nervous/anxious.      Physical Exam Updated Vital Signs BP 110/60 (BP Location: Right Arm)   Pulse 97   Temp 98.1 F (36.7 C) (Oral)   Resp 18   SpO2 100%   Physical Exam  Constitutional: She appears well-developed and well-nourished. No distress.  Patient rocking forward and backward in the bed  HENT:  Head: Normocephalic and atraumatic.  Mouth/Throat: Oropharynx is clear and moist. No oropharyngeal exudate.  Eyes: Conjunctivae are normal. Pupils  are equal, round, and reactive to light. Right eye exhibits no discharge. Left eye exhibits no discharge. No scleral icterus.  Neck: Normal range of motion. Neck supple. No thyromegaly present.  Cardiovascular: Normal rate, regular rhythm, normal heart sounds and intact distal pulses. Exam reveals no gallop and no friction rub.  No murmur heard. Pulmonary/Chest: Effort normal and breath sounds normal. No stridor. No respiratory distress. She has no wheezes. She has no rales.  Abdominal: Soft. Bowel sounds are normal. She exhibits no distension. There is generalized tenderness and tenderness in the right upper quadrant, epigastric area, suprapubic area, left upper quadrant and left lower quadrant. There is no rebound, no guarding and no CVA tenderness.  Musculoskeletal: She exhibits no edema.  Lymphadenopathy:    She has no cervical adenopathy.  Neurological: She is alert. Coordination normal.  Skin: Skin is warm and dry. No rash noted. She is not diaphoretic. No pallor.  Psychiatric: She has a normal mood and affect.  Nursing note and vitals reviewed.    ED Treatments / Results  Labs (all labs ordered are listed, but only abnormal results are displayed) Labs Reviewed  COMPREHENSIVE METABOLIC PANEL - Abnormal; Notable  for the following components:      Result Value   Sodium 132 (*)    Chloride 99 (*)    CO2 18 (*)    Glucose, Bld 291 (*)    Total Bilirubin 1.8 (*)    All other components within normal limits  CBC WITH DIFFERENTIAL/PLATELET - Abnormal; Notable for the following components:   RDW 18.8 (*)    Platelets 121 (*)    All other components within normal limits  URINALYSIS, ROUTINE W REFLEX MICROSCOPIC - Abnormal; Notable for the following components:   Glucose, UA 150 (*)    Ketones, ur 80 (*)    Protein, ur 100 (*)    Bacteria, UA RARE (*)    Squamous Epithelial / LPF 0-5 (*)    All other components within normal limits  BLOOD GAS, VENOUS - Abnormal; Notable for the following components:   pCO2, Ven 21.2 (*)    pO2, Ven 115.0 (*)    Bicarbonate 12.0 (*)    Acid-base deficit 11.4 (*)    All other components within normal limits  URINE CULTURE  LIPASE, BLOOD    EKG  EKG Interpretation None       Radiology No results found.  Procedures Procedures (including critical care time)  Medications Ordered in ED Medications  LORazepam (ATIVAN) tablet 1 mg (1 mg Oral Given 06/03/17 1308)  haloperidol lactate (HALDOL) injection 5 mg (5 mg Intramuscular Given 06/03/17 1341)  ondansetron (ZOFRAN-ODT) disintegrating tablet 4 mg (4 mg Oral Given 06/03/17 1307)     Initial Impression / Assessment and Plan / ED Course  I have reviewed the triage vital signs and the nursing notes.  Pertinent labs & imaging results that were available during my care of the patient were reviewed by me and considered in my medical decision making (see chart for details).     Patient with history of type 1 diabetes and gastroparesis presenting with abdominal pain, nausea, and vomiting typical of patient's gastroparesis.  IV access unable to be obtained by nursing staff, IV team, and attending physician, however blood was obtained.  No DKA. Blood counts within normal limits except mild thrombocytopenia,  121K.  Renal function within normal limits, as well as LFTs and electrolytes. UA shows ketones, however this seems to be chronic for the patient.  No UTI noted however culture sent.  Suspect patient's symptoms are related to dehydration.  Ppatient given Haldol, Ativan, Zofran in the ED with good relief of symptoms.  On repeat abdominal exam and vomiting controlled, patient with no abdominal tenderness.  Abdomen is soft.  Patient tolerating oral fluids in the ED.  Will discharge home with refill of ODT Zofran.  Patient advised to follow-up with PCP in 1-2 days.  Strict return precautions given.  Patient understands and agrees with plan.  Patient vitals stable and discharged in satisfactory condition.  Patient also evaluated by Dr. Tomi Bamberger who guided the patient's management and agrees with plan.  Final Clinical Impressions(s) / ED Diagnoses   Final diagnoses:  Gastroparesis    ED Discharge Orders        Ordered    ondansetron (ZOFRAN-ODT) 8 MG disintegrating tablet  Every 8 hours PRN     06/03/17 [address] [place], [place] 06/03/17 1742    Dorie Rank, MD 06/04/17 [phone]

## 2017-06-03 NOTE — ED Notes (Signed)
No IV access, unable to admin IV fluids, MD is aware

## 2017-06-03 NOTE — ED Notes (Signed)
ED Provider at bedside. 

## 2017-06-03 NOTE — ED Notes (Signed)
IV team attempted to obtain IV access, and was unable.

## 2017-06-03 NOTE — ED Notes (Signed)
Patient has been stuck x2 with no success. Patient is refusing any additional sticks and requesting IV team only at this point. Reports this is what generally happens when she is here. IV team consult with labs have been placed.

## 2017-06-03 NOTE — Discharge Instructions (Signed)
Take your Zofran as prescribed at home, as needed for nausea or vomiting.  Begin with  clear liquids at home and progressed to very bland foods.  Avoid greasy, fatty, spicy foods until you are been tolerating more bland foods.  Please follow-up with your primary care provider in 2 days for recheck.  Please return to the emergency department immediately if you develop any new or worsening symptoms.

## 2017-06-03 NOTE — ED Notes (Signed)
Unsuccessful IV attempt x1. Apolonio Schneiders RN asked to attempt.

## 2017-06-03 NOTE — ED Triage Notes (Signed)
Per [facility], patient coming from home, c/o N/V and abdominal pain since yesterday. Hx gastroparesis. Ambulatory.  CBG 225

## 2017-06-03 NOTE — ED Notes (Signed)
Bed: WA03 Expected date:  Expected time:  Means of arrival:  Comments: EMS-gastroparesis

## 2017-06-04 LAB — URINE CULTURE

## 2017-06-07 ENCOUNTER — Telehealth: Payer: Self-pay | Admitting: Family Medicine

## 2017-06-07 NOTE — Telephone Encounter (Signed)
Call placed to patient [phone] to check on her status and schedule an appointment. No answer. Left patient a message asking her to return my call at [phone].

## 2017-06-10 ENCOUNTER — Encounter (HOSPITAL_COMMUNITY): Payer: Self-pay

## 2017-06-10 ENCOUNTER — Inpatient Hospital Stay (HOSPITAL_COMMUNITY)
Admission: EM | Admit: 2017-06-10 | Discharge: 2017-06-12 | DRG: 638 | Disposition: A | Discharge status: Home / Self Care | Payer: Medicaid Other | Attending: Family Medicine | Admitting: Family Medicine

## 2017-06-10 ENCOUNTER — Other Ambulatory Visit: Payer: Self-pay

## 2017-06-10 DIAGNOSIS — N179 Acute kidney failure, unspecified: Secondary | ICD-10-CM | POA: Diagnosis not present

## 2017-06-10 DIAGNOSIS — E1043 Type 1 diabetes mellitus with diabetic autonomic (poly)neuropathy: Secondary | ICD-10-CM | POA: Diagnosis present

## 2017-06-10 DIAGNOSIS — E785 Hyperlipidemia, unspecified: Secondary | ICD-10-CM | POA: Diagnosis present

## 2017-06-10 DIAGNOSIS — Z8249 Family history of ischemic heart disease and other diseases of the circulatory system: Secondary | ICD-10-CM

## 2017-06-10 DIAGNOSIS — E1010 Type 1 diabetes mellitus with ketoacidosis without coma: Principal | ICD-10-CM | POA: Diagnosis present

## 2017-06-10 DIAGNOSIS — D509 Iron deficiency anemia, unspecified: Secondary | ICD-10-CM | POA: Diagnosis present

## 2017-06-10 DIAGNOSIS — Z833 Family history of diabetes mellitus: Secondary | ICD-10-CM

## 2017-06-10 DIAGNOSIS — G2401 Drug induced subacute dyskinesia: Secondary | ICD-10-CM | POA: Diagnosis present

## 2017-06-10 DIAGNOSIS — G249 Dystonia, unspecified: Secondary | ICD-10-CM | POA: Diagnosis not present

## 2017-06-10 DIAGNOSIS — R112 Nausea with vomiting, unspecified: Secondary | ICD-10-CM

## 2017-06-10 DIAGNOSIS — E1143 Type 2 diabetes mellitus with diabetic autonomic (poly)neuropathy: Secondary | ICD-10-CM | POA: Diagnosis not present

## 2017-06-10 DIAGNOSIS — Y92230 Patient room in hospital as the place of occurrence of the external cause: Secondary | ICD-10-CM | POA: Diagnosis not present

## 2017-06-10 DIAGNOSIS — Z888 Allergy status to other drugs, medicaments and biological substances status: Secondary | ICD-10-CM

## 2017-06-10 DIAGNOSIS — Z794 Long term (current) use of insulin: Secondary | ICD-10-CM

## 2017-06-10 DIAGNOSIS — Z880 Allergy status to penicillin: Secondary | ICD-10-CM

## 2017-06-10 DIAGNOSIS — K5900 Constipation, unspecified: Secondary | ICD-10-CM | POA: Diagnosis present

## 2017-06-10 DIAGNOSIS — K3184 Gastroparesis: Secondary | ICD-10-CM | POA: Diagnosis present

## 2017-06-10 DIAGNOSIS — Z79899 Other long term (current) drug therapy: Secondary | ICD-10-CM

## 2017-06-10 DIAGNOSIS — F419 Anxiety disorder, unspecified: Secondary | ICD-10-CM | POA: Diagnosis present

## 2017-06-10 DIAGNOSIS — F329 Major depressive disorder, single episode, unspecified: Secondary | ICD-10-CM | POA: Diagnosis present

## 2017-06-10 DIAGNOSIS — E1069 Type 1 diabetes mellitus with other specified complication: Secondary | ICD-10-CM | POA: Diagnosis present

## 2017-06-10 DIAGNOSIS — E1110 Type 2 diabetes mellitus with ketoacidosis without coma: Secondary | ICD-10-CM | POA: Diagnosis present

## 2017-06-10 DIAGNOSIS — E860 Dehydration: Secondary | ICD-10-CM | POA: Diagnosis present

## 2017-06-10 DIAGNOSIS — Z884 Allergy status to anesthetic agent status: Secondary | ICD-10-CM

## 2017-06-10 DIAGNOSIS — E1040 Type 1 diabetes mellitus with diabetic neuropathy, unspecified: Secondary | ICD-10-CM | POA: Diagnosis present

## 2017-06-10 DIAGNOSIS — T450X5A Adverse effect of antiallergic and antiemetic drugs, initial encounter: Secondary | ICD-10-CM | POA: Diagnosis not present

## 2017-06-10 DIAGNOSIS — K219 Gastro-esophageal reflux disease without esophagitis: Secondary | ICD-10-CM | POA: Diagnosis present

## 2017-06-10 DIAGNOSIS — R300 Dysuria: Secondary | ICD-10-CM | POA: Diagnosis present

## 2017-06-10 DIAGNOSIS — I10 Essential (primary) hypertension: Secondary | ICD-10-CM | POA: Diagnosis present

## 2017-06-10 DIAGNOSIS — R000 Tachycardia, unspecified: Secondary | ICD-10-CM | POA: Diagnosis present

## 2017-06-10 LAB — COMPREHENSIVE METABOLIC PANEL
ALBUMIN: 3.9 g/dL (ref 3.5–5.0)
ALT: 14 U/L (ref 14–54)
ANION GAP: 20 — AB (ref 5–15)
AST: 22 U/L (ref 15–41)
Alkaline Phosphatase: 86 U/L (ref 38–126)
BILIRUBIN TOTAL: 2.1 mg/dL — AB (ref 0.3–1.2)
BUN: 18 mg/dL (ref 6–20)
CHLORIDE: 102 mmol/L (ref 101–111)
CO2: 14 mmol/L — AB (ref 22–32)
Calcium: 9.4 mg/dL (ref 8.9–10.3)
Creatinine, Ser: 1.36 mg/dL — ABNORMAL HIGH (ref 0.44–1.00)
GFR calc Af Amer: 52 mL/min — ABNORMAL LOW (ref >60)
GFR calc non Af Amer: 45 mL/min — ABNORMAL LOW (ref >60)
GLUCOSE: 427 mg/dL — AB (ref 65–99)
POTASSIUM: 4.3 mmol/L (ref 3.5–5.1)
SODIUM: 136 mmol/L (ref 135–145)
TOTAL PROTEIN: 7.7 g/dL (ref 6.5–8.1)

## 2017-06-10 LAB — CBC WITH DIFFERENTIAL/PLATELET
Basophils Absolute: 0 10*3/uL (ref 0.0–0.1)
Basophils Relative: 0 %
EOS PCT: 0 %
Eosinophils Absolute: 0 10*3/uL (ref 0.0–0.7)
HEMATOCRIT: 38.8 % (ref 36.0–46.0)
Hemoglobin: 12.9 g/dL (ref 12.0–15.0)
LYMPHS ABS: 1.7 10*3/uL (ref 0.7–4.0)
LYMPHS PCT: 18 %
MCH: 26.9 pg (ref 26.0–34.0)
MCHC: 33.2 g/dL (ref 30.0–36.0)
MCV: 81 fL (ref 78.0–100.0)
MONO ABS: 0.5 10*3/uL (ref 0.1–1.0)
MONOS PCT: 5 %
NEUTROS ABS: 7.3 10*3/uL (ref 1.7–7.7)
Neutrophils Relative %: 77 %
PLATELETS: 331 10*3/uL (ref 150–400)
RBC: 4.79 MIL/uL (ref 3.87–5.11)
RDW: 18.5 % — AB (ref 11.5–15.5)
WBC: 9.4 10*3/uL (ref 4.0–10.5)

## 2017-06-10 LAB — URINALYSIS, ROUTINE W REFLEX MICROSCOPIC
BACTERIA UA: NONE SEEN
Bilirubin Urine: NEGATIVE
Hgb urine dipstick: NEGATIVE
KETONES UR: 80 mg/dL — AB
LEUKOCYTES UA: NEGATIVE
Nitrite: NEGATIVE
PH: 5 (ref 5.0–8.0)
PROTEIN: 30 mg/dL — AB
Specific Gravity, Urine: 1.023 (ref 1.005–1.030)

## 2017-06-10 LAB — I-STAT BETA HCG BLOOD, ED (MC, WL, AP ONLY): I-stat hCG, quantitative: <5 m[IU]/mL (ref <5)

## 2017-06-10 LAB — CBG MONITORING, ED
GLUCOSE-CAPILLARY: 151 mg/dL — AB (ref 65–99)
GLUCOSE-CAPILLARY: 201 mg/dL — AB (ref 65–99)
GLUCOSE-CAPILLARY: 214 mg/dL — AB (ref 65–99)
GLUCOSE-CAPILLARY: 270 mg/dL — AB (ref 65–99)
GLUCOSE-CAPILLARY: 359 mg/dL — AB (ref 65–99)
Glucose-Capillary: 147 mg/dL — ABNORMAL HIGH (ref 65–99)
Glucose-Capillary: 427 mg/dL — ABNORMAL HIGH (ref 65–99)

## 2017-06-10 LAB — LIPASE, BLOOD: Lipase: 22 U/L (ref 11–51)

## 2017-06-10 MED ORDER — DEXTROSE-NACL 5-0.45 % IV SOLN
INTRAVENOUS | Status: DC
  Administered 2017-06-10: 21:00:00 via INTRAVENOUS

## 2017-06-10 MED ORDER — ONDANSETRON HCL 4 MG/2ML IJ SOLN
4.0000 mg | Freq: Once | INTRAMUSCULAR | Status: AC
  Administered 2017-06-10: 4 mg via INTRAVENOUS
  Filled 2017-06-10: qty 2

## 2017-06-10 MED ORDER — DIPHENHYDRAMINE HCL 50 MG/ML IJ SOLN
25.0000 mg | Freq: Once | INTRAMUSCULAR | Status: AC
  Administered 2017-06-10: 25 mg via INTRAVENOUS
  Filled 2017-06-10: qty 1

## 2017-06-10 MED ORDER — SODIUM CHLORIDE 0.9 % IV BOLUS (SEPSIS)
1000.0000 mL | Freq: Once | INTRAVENOUS | Status: AC
  Administered 2017-06-10: 1000 mL via INTRAVENOUS

## 2017-06-10 MED ORDER — LORAZEPAM 2 MG/ML IJ SOLN
1.0000 mg | Freq: Once | INTRAMUSCULAR | Status: AC
  Administered 2017-06-10: 1 mg via INTRAVENOUS
  Filled 2017-06-10: qty 1

## 2017-06-10 MED ORDER — POTASSIUM CHLORIDE 10 MEQ/100ML IV SOLN
10.0000 meq | INTRAVENOUS | Status: AC
  Filled 2017-06-10: qty 100

## 2017-06-10 MED ORDER — SODIUM CHLORIDE 0.9 % IV SOLN
INTRAVENOUS | Status: DC
  Administered 2017-06-10: 3.7 [IU]/h via INTRAVENOUS
  Administered 2017-06-10: 6 [IU]/h via INTRAVENOUS
  Filled 2017-06-10: qty 1

## 2017-06-10 NOTE — ED Notes (Signed)
Attempted [place] IV placement on patient. Unable to obtain.

## 2017-06-10 NOTE — H&P (Signed)
[facility] History and Physical  [name] WRU:[id number] DOB: Nov 05, 1965 DOA: 06/10/2017   PCP: [name], MD  Specialists: None  Chief Complaint: Nausea vomiting ongoing for 2 days  HPI: [name] is a 51 y.o. female with a past medical history of type 1 diabetes, hypertension who was last hospitalized in November for severe gastroparesis.  Patient states that she was in her usual state of health until about 2 days ago when she started feeling nauseated and then started having episodes of vomiting.  This was followed by onset of diffuse abdominal pain.  She denies any fever.  She feels cold all the time.  She was vomiting bilious material.  Denies any blood in the emesis.  She has had burning sensation with urination for a month and has been told previously that there has been no signs of infection in her urine.  Symptoms persisted so she decided to come into the hospital.  She had been taking her insulin every day except for today when she felt too sick to take it.  In the emergency department patient was found to be in diabetic ketoacidosis.  She was noted to be dehydrated.  She will need hospitalization for further management.  Home Medications: Prior to Admission medications   Medication Sig Start Date End Date Taking? Authorizing Provider  benztropine (COGENTIN) 1 MG tablet Take 1 tablet (1 mg total) by mouth 2 (two) times daily. 04/03/17  Yes Dessa Phi, DO  busPIRone (BUSPAR) 10 MG tablet Take 1 tablet (10 mg total) by mouth 3 (three) times daily. 05/18/17  Yes Emokpae, Courage, MD  clonazePAM (KLONOPIN) 0.5 MG disintegrating tablet Take 1 tablet (0.5 mg total) by mouth 2 (two) times daily. 04/15/17  Yes Gerlene Fee, NP  erythromycin base (E-MYCIN) 500 MG tablet Take 0.5 tablets (250 mg total) by mouth 3 (three) times daily after meals. 05/18/17 05/18/18 Yes Emokpae, Courage, MD  finasteride (PROSCAR) 5 MG tablet Take 1 tablet (5 mg total) by mouth daily.  04/04/17  Yes Dessa Phi, DO  furosemide (LASIX) 20 MG tablet Take 1 tablet (20 mg total) by mouth daily as needed for fluid or edema (as needed for swelling). 05/18/17  Yes Emokpae, Courage, MD  insulin aspart (NOVOLOG FLEXPEN) 100 UNIT/ML FlexPen 6 Units. Inject as per sliding scale subcutaneously before meals and at bedtime 70 - 119 = 0 units 120 - 150 = 2 units 151 - 180 = 4 units 181 - 210 = 5 units Greater than 400 notify MD 211 - 240 = 6 units 241 - 270 = 8 units 271 - 300 = 9 units 301 - 330 = 10 units 331 - 360 = 11 units 361 - 400 = 12 units    Yes [provider]  Insulin Detemir (LEVEMIR FLEXPEN) 100 UNIT/ML Pen Inject 12 Units into the skin 2 (two) times daily. 05/18/17  Yes Emokpae, Courage, MD  ondansetron (ZOFRAN-ODT) 8 MG disintegrating tablet Take 1 tablet (8 mg total) by mouth every 8 (eight) hours as needed for nausea or vomiting. 06/03/17  Yes Law, Alexandra M, PA-C  pantoprazole (PROTONIX) 40 MG tablet Take 1 tablet (40 mg total) 2 (two) times daily by mouth. 05/09/17  Yes Barton Dubois, MD  polyethylene glycol (MIRALAX / GLYCOLAX) packet Take 17 g by mouth daily as needed for mild constipation.  04/11/17  Yes [provider]  pregabalin (LYRICA) 150 MG capsule Take 150 mg 2 (two) times daily by mouth. 05/05/15  Yes [provider]  pyridostigmine (MESTINON) 60 MG tablet Take 30 mg by mouth 3 (three) times daily.    Yes [provider]  sodium bicarbonate 650 MG tablet Take 1 tablet (650 mg total) daily by mouth. 05/10/17  Yes Barton Dubois, MD    Allergies:  Allergies  Allergen Reactions  . Anesthetics, Amide Nausea And Vomiting  . Penicillins Diarrhea and Nausea And Vomiting    Has patient had a PCN reaction causing immediate rash, facial/tongue/throat swelling, SOB or lightheadedness with hypotension: Yes Has patient had a PCN reaction causing severe rash involving mucus membranes or skin necrosis: No Has patient had a  PCN reaction that required hospitalization No Has patient had a PCN reaction occurring within the last 10 years: Yes  If all of the above answers are "NO", then may proceed with Cephalosporin use.   . Buprenorphine Hcl Rash  . Encainide Nausea And Vomiting  . Metoclopramide Other (See Comments)    Dystonia, muscle rigidity.     Past Medical History: Past Medical History:  Diagnosis Date  . Depression   . Diabetes mellitus without complication ([place])   . Essential hypertension   . Gastroparesis   . GERD (gastroesophageal reflux disease)   . HLD (hyperlipidemia)     Past Surgical History:  Procedure Laterality Date  . COLONOSCOPY  09/27/2014   at [facility]  . ESOPHAGOGASTRODUODENOSCOPY  09/27/2014   at [facility], Dr Rolan Lipa. biospy neg for celiac, neg for H pylori.   . EYE SURGERY    . gailstones    . IR FLUORO GUIDE CV LINE RIGHT  02/01/2017  . IR FLUORO GUIDE CV LINE RIGHT  03/06/2017  . IR FLUORO GUIDE CV LINE RIGHT  03/25/2017  . IR GENERIC HISTORICAL  01/24/2016   IR FLUORO GUIDE CV LINE RIGHT 01/24/2016 Darrell K Allred, PA-C [facility]-INTERV RAD  . IR GENERIC HISTORICAL  01/24/2016   IR US GUIDE VASC ACCESS RIGHT 01/24/2016 Darrell K Allred, PA-C [facility]  . IR US GUIDE VASC ACCESS RIGHT  02/01/2017  . IR US GUIDE VASC ACCESS RIGHT  03/06/2017  . IR US GUIDE VASC ACCESS RIGHT  03/25/2017  . POSTERIOR VITRECTOMY AND MEMBRANE PEEL-LEFT EYE  09/28/2002  . POSTERIOR VITRECTOMY AND MEMBRANE PEEL-RIGHT EYE  03/16/2002  . RETINAL DETACHMENT SURGERY      Social History: She lives in [place] with her sister.  She denies smoking alcohol use or illicit drug use.  Usually independent with daily activities.  Family History:  Family History  Problem Relation Age of Onset  . Cystic fibrosis Mother   . Hypertension Father   . Diabetes Brother   . Hypertension Maternal Grandmother      Review of Systems - History obtained from the patient General ROS: positive for  - fatigue Psychological  ROS: positive for - anxiety Ophthalmic ROS: negative ENT ROS: negative Allergy and Immunology ROS: negative Hematological and Lymphatic ROS: negative Endocrine ROS: negative Respiratory ROS: no cough, shortness of breath, or wheezing Cardiovascular ROS: no chest pain or dyspnea on exertion Gastrointestinal ROS: as in hpi Genito-Urinary ROS: as in hpi Musculoskeletal ROS: negative Neurological ROS: no TIA or stroke symptoms Dermatological ROS: negative  Physical Examination  Vitals:   06/10/17 1122 06/10/17 1134 06/10/17 1500 06/10/17 1622  BP: 116/87  (!) 158/74 (!) 164/78  Pulse: 98  (!) 114 (!) 113  Resp: (!) 22  (!) 24 (!) 39  Temp: 98.5 F (36.9 C)   98.5 F (36.9 C)  TempSrc: Oral   Oral  SpO2: 100%  100% 100%  Weight:  63.5 kg (140 lb)    Height:  5\' 2"  (1.575 m)      BP (!) 164/78 (BP Location: Right Arm)   Pulse (!) 113   Temp 98.5 F (36.9 C) (Oral)   Resp (!) 39   Ht 5\' 2"  (1.575 m)   Wt 63.5 kg (140 lb)   LMP 05/16/2017   SpO2 100%   BMI 25.61 kg/m   General appearance: alert, cooperative, appears stated age and no distress Head: Normocephalic, without obvious abnormality, atraumatic Eyes: conjunctivae/corneas clear. PERRL, EOM's intact.  Throat: Dry mucous membranes Neck: no adenopathy, no carotid bruit, no JVD, supple, symmetrical, trachea midline and thyroid not enlarged, symmetric, no tenderness/mass/nodules Resp: clear to auscultation bilaterally Cardio: S1-S2 is tachycardic regular.  No S3-S4.  No rubs murmurs or bruit.  No pedal edema. GI: Abdomen is soft.  Mildly tender diffusely but no evidence for rebound rigidity or guarding.  Bowel sounds are sluggish.  No masses organomegaly appreciated. Extremities: extremities normal, atraumatic, no cyanosis or edema Pulses: 2+ and symmetric Skin: Skin color, texture, turgor normal. No rashes or lesions Lymph nodes: Cervical, supraclavicular, and axillary nodes normal. Neurologic: Awake alert.  Oriented  x3.  Motor strength equal bilateral upper and lower extremities.  No obvious focal neurological deficits.    Labs on Admission: I have personally reviewed following labs and imaging studies  CBC: Recent Labs  Lab 06/10/17 1437  WBC 9.4  NEUTROABS 7.3  HGB 12.9  HCT 38.8  MCV 81.0  PLT 809   Basic Metabolic Panel: Recent Labs  Lab 06/10/17 1437  NA 136  K 4.3  CL 102  CO2 14*  GLUCOSE 427*  BUN 18  CREATININE 1.36*  CALCIUM 9.4   GFR: Estimated Creatinine Clearance: 43.4 mL/min (A) (by C-G formula based on SCr of 1.36 mg/dL (H)). Liver Function Tests: Recent Labs  Lab 06/10/17 1437  AST 22  ALT 14  ALKPHOS 86  BILITOT 2.1*  PROT 7.7  ALBUMIN 3.9   Recent Labs  Lab 06/10/17 1437  LIPASE 22   CBG: Recent Labs  Lab 06/10/17 1619  GLUCAP 427*    My interpretation of Electrocardiogram: Poor quality EKG.  Appears to be sinus tachycardia at 110 bpm.  No obvious ischemic changes noted   Problem List  Principal Problem:   DKA (diabetic ketoacidoses) ([place]) Active Problems:   Diabetic gastroparesis ([place])   Essential hypertension   Assessment: This is a 51 year old female with a past medical history of type 1 diabetes who comes in with 2-day history of nausea, vomiting, abdominal pain and is found to have diabetic ketoacidosis.  Exact reason for her to go into DKA is not known.  She mentions dysuria but urinalysis unremarkable.  She denies any respiratory symptoms.  Her WBC is normal.  Her abdominal pain most likely is due to episodes of nausea and vomiting.  Her lipase level is normal.  LFTs are normal.  Her abdomen is benign on examination.  She also has mild renal failure with mildly elevated creatinine.  Plan: #1 diabetic ketoacidosis in a patient with type 1 diabetes: Patient will be admitted to stepdown unit.  She will be placed on insulin infusion.  She will be given IV fluids.  Anion gap will be monitored closely.  She will be kept n.p.o. except  medications and ice chips.  Her HbA1c was 10.1 in September.  #2  Nausea vomiting and abdominal pain: Most likely due to  the above.  We will do abdominal films.  She reports constipation.  She has not had a bowel movement in 5 days.  We will place her on laxatives.  #3  Severe diabetic gastroparesis: Unfortunately patient has a dystonic reaction to Reglan.  She is currently on erythromycin which will be continued.  Followed at the [facility] gastroparesis clinic.  #4  Mild acute renal failure: Creatinine is mildly elevated.  She will be given IV fluids.  UA does not show any evidence for infection.  Renal function will be monitored closely.  Monitor urine output.  #5  History of essential hypertension: Monitor blood pressures closely.  No antihypertensives are listed on her home medication list except for Lasix.  This will be held for now due to her dehydration.  Mestinon is listed on patient's home medication list.  But patient is not sure if she takes this medicine or not.  Care everywhere was reviewed.  It appears that patient was seen by the gastroparesis clinic in [facility] in October. They were the ones who prescribed this medicine to the patient to help with stomach and colon contractions.  So we will hold this medicine for now and resume it when she is able to take orally.  It appears that she was also prescribed Marinol at that time.  DVT Prophylaxis: Subcutaneous heparin Code Status: Full code Family Communication: Discussed with the patient Consults called: None  Severity of Illness: The appropriate patient status for this patient is OBSERVATION. Observation status is judged to be reasonable and necessary in order to provide the required intensity of service to ensure the patient's safety. The patient's presenting symptoms, physical exam findings, and initial radiographic and laboratory data in the context of their medical condition is felt to place them at  decreased risk for further clinical deterioration. Furthermore, it is anticipated that the patient will be medically stable for discharge from the hospital within 2 midnights of admission. The following factors support the patient status of observation.   " The patient's presenting symptoms include nausea vomiting. " The physical exam findings include tachycardia. " The initial radiographic and laboratory data are suggestive of DKA.   Further management decisions will depend on results of further testing and patient's response to treatment.   [name]  [facility] Pager [phone]  If 7PM-7AM, please contact night-coverage [email or web address] Password [facility]  06/10/2017, 5:23 PM

## 2017-06-10 NOTE — ED Notes (Signed)
Pt c/o anxiety, looking for PA Sean to inform

## 2017-06-10 NOTE — ED Notes (Signed)
Patient still in pain not able to give urine sample.

## 2017-06-10 NOTE — ED Triage Notes (Signed)
Per EMS, pt experiencing N/V the past few days. Hx of gastroparesis. Unable to take insulin. CBG 449 for EMS. Pt also c/o non traumatic R shoulder pain

## 2017-06-10 NOTE — ED Notes (Signed)
Patient still complaining of pain and nausea. Patient will try to give urine sample at a later time.

## 2017-06-10 NOTE — ED Provider Notes (Signed)
[place] [facility] DEPT Provider Note   CSN: [id number] Arrival date & time: 06/10/17  1109     History   Chief Complaint Chief Complaint  Patient presents with  . Emesis  . Hyperglycemia    HPI [name] is a 51 y.o. female.  HPI   [name] is a 51 y.o. female, with a history of DM, DKA, gastroparesis, HTN, and GERD, presenting to the ED with N/V beginning yesterday. Also endorses generalized abdominal soreness after onset of vomiting, moderate, nonradiating. Poor fluid intake.  Has not taken insulin since yesterday at noon, 6 unit Novolog, 12 unit Levemir, because she "has been too sick." Last 8mg  zofran was last night.   Does endorses constipation with last BM about 5 days ago. She usually has a BM every 3 days or so.    Denies SOB, fever/chills, CP, urinary complaints, or any other complaints.      Past Medical History:  Diagnosis Date  . Depression   . Diabetes mellitus without complication ([place])   . Essential hypertension   . Gastroparesis   . GERD (gastroesophageal reflux disease)   . HLD (hyperlipidemia)     Patient Active Problem List   Diagnosis Date Noted  . Gastroparesis 05/16/2017  . Depression   . DKA (diabetic ketoacidoses) ([place]) 05/06/2017  . Severe protein-calorie malnutrition ([place]) 04/17/2017  . Acute dystonic reaction due to drugs 04/01/2017  . Serotonin syndrome 03/25/2017  . Tardive dyskinesia   . Tachycardia 03/16/2017  . Diarrhea 03/05/2017  . DKA, type 1 ([place]) 03/18/2016  . Noncompliance with treatment plan 03/13/2016  . Essential hypertension 01/24/2016  . Abnormal ECG 11/27/2015  . HLD (hyperlipidemia)   . GERD (gastroesophageal reflux disease)   . Anemia, iron deficiency   . DKA, type 1, not at goal [facility])   . Vitamin B12 deficiency 08/16/2015  . Diabetic gastroparesis ([place])   . Diabetic neuropathy, type I diabetes mellitus ([place]) 05/18/2015  . Microcytic anemia 05/18/2015  . Anxiety and  depression 05/18/2015    Past Surgical History:  Procedure Laterality Date  . COLONOSCOPY  09/27/2014   at [facility]  . ESOPHAGOGASTRODUODENOSCOPY  09/27/2014   at [facility], Dr Rolan Lipa. biospy neg for celiac, neg for H pylori.   . EYE SURGERY    . gailstones    . IR FLUORO GUIDE CV LINE RIGHT  02/01/2017  . IR FLUORO GUIDE CV LINE RIGHT  03/06/2017  . IR FLUORO GUIDE CV LINE RIGHT  03/25/2017  . IR GENERIC HISTORICAL  01/24/2016   IR FLUORO GUIDE CV LINE RIGHT 01/24/2016 Darrell K Allred, PA-C [facility]INTER[facility]  . IR GENERIC HISTORICAL  01/24/2016   IR US GUIDE VASC ACCESS RIGHT 01/24/2016 Darrell K Allred, PA-C [facility]  . IR US GUIDE VASC ACCESS RIGHT  02/01/2017  . IR US GUIDE VASC ACCESS RIGHT  03/06/2017  . IR US GUIDE VASC ACCESS RIGHT  03/25/2017  . POSTERIOR VITRECTOMY AND MEMBRANE PEEL-LEFT EYE  09/28/2002  . POSTERIOR VITRECTOMY AND MEMBRANE PEEL-RIGHT EYE  03/16/2002  . RETINAL DETACHMENT SURGERY      OB History    No data available       Home Medications    Prior to Admission medications   Medication Sig Start Date End Date Taking? Authorizing Provider  benztropine (COGENTIN) 1 MG tablet Take 1 tablet (1 mg total) by mouth 2 (two) times daily. 04/03/17  Yes Dessa Phi, DO  busPIRone (BUSPAR) 10 MG tablet Take 1 tablet (10 mg  total) by mouth 3 (three) times daily. 05/18/17  Yes Emokpae, Courage, MD  clonazePAM (KLONOPIN) 0.5 MG disintegrating tablet Take 1 tablet (0.5 mg total) by mouth 2 (two) times daily. 04/15/17  Yes Gerlene Fee, NP  erythromycin base (E-MYCIN) 500 MG tablet Take 0.5 tablets (250 mg total) by mouth 3 (three) times daily after meals. 05/18/17 05/18/18 Yes Emokpae, Courage, MD  finasteride (PROSCAR) 5 MG tablet Take 1 tablet (5 mg total) by mouth daily. 04/04/17  Yes Dessa Phi, DO  furosemide (LASIX) 20 MG tablet Take 1 tablet (20 mg total) by mouth daily as needed for fluid or edema (as needed for swelling). 05/18/17  Yes Emokpae, Courage, MD    insulin aspart (NOVOLOG FLEXPEN) 100 UNIT/ML FlexPen 6 Units. Inject as per sliding scale subcutaneously before meals and at bedtime 70 - 119 = 0 units 120 - 150 = 2 units 151 - 180 = 4 units 181 - 210 = 5 units Greater than 400 notify MD 211 - 240 = 6 units 241 - 270 = 8 units 271 - 300 = 9 units 301 - 330 = 10 units 331 - 360 = 11 units 361 - 400 = 12 units    Yes [provider]  Insulin Detemir (LEVEMIR FLEXPEN) 100 UNIT/ML Pen Inject 12 Units into the skin 2 (two) times daily. 05/18/17  Yes Emokpae, Courage, MD  ondansetron (ZOFRAN-ODT) 8 MG disintegrating tablet Take 1 tablet (8 mg total) by mouth every 8 (eight) hours as needed for nausea or vomiting. 06/03/17  Yes Law, Alexandra M, PA-C  pantoprazole (PROTONIX) 40 MG tablet Take 1 tablet (40 mg total) 2 (two) times daily by mouth. 05/09/17  Yes Barton Dubois, MD  polyethylene glycol (MIRALAX / GLYCOLAX) packet Take 17 g by mouth daily as needed for mild constipation.  04/11/17  Yes [provider]  pregabalin (LYRICA) 150 MG capsule Take 150 mg 2 (two) times daily by mouth. 05/05/15  Yes [provider]  pyridostigmine (MESTINON) 60 MG tablet Take 30 mg by mouth 3 (three) times daily.    Yes [provider]  sodium bicarbonate 650 MG tablet Take 1 tablet (650 mg total) daily by mouth. 05/10/17  Yes Barton Dubois, MD    Family History Family History  Problem Relation Age of Onset  . Cystic fibrosis Mother   . Hypertension Father   . Diabetes Brother   . Hypertension Maternal Grandmother     Social History Social History   Tobacco Use  . Smoking status: Never Smoker  . Smokeless tobacco: Never Used  Substance Use Topics  . Alcohol use: No  . Drug use: No     Allergies   Anesthetics, amide; Penicillins; Buprenorphine hcl; Encainide; and Metoclopramide   Review of Systems Review of Systems  Constitutional: Negative for chills and fever.  Respiratory: Negative for shortness  of breath.   Cardiovascular: Negative for chest pain.  Gastrointestinal: Positive for abdominal pain, constipation, nausea and vomiting. Negative for blood in stool and diarrhea.  Genitourinary: Positive for frequency. Negative for dysuria.  All other systems reviewed and are negative.    Physical Exam Updated Vital Signs BP 116/87 (BP Location: Left Arm)   Pulse 98   Temp 98.5 F (36.9 C) (Oral)   Resp (!) 22   Ht 5\' 2"  (1.575 m)   Wt 63.5 kg (140 lb)   LMP 05/16/2017   SpO2 100%   BMI 25.61 kg/m   Physical Exam  Constitutional: She appears well-developed and  well-nourished. She appears distressed (appears uncomfortable, vomiting).  HENT:  Head: Normocephalic and atraumatic.  Eyes: Conjunctivae are normal.  Neck: Neck supple.  Cardiovascular: Regular rhythm, normal heart sounds and intact distal pulses. Tachycardia present.  Pulmonary/Chest: Effort normal and breath sounds normal. No respiratory distress.  Abdominal: Soft. There is generalized tenderness. There is no guarding.  Musculoskeletal: She exhibits no edema.  Lymphadenopathy:    She has no cervical adenopathy.  Neurological: She is alert.  Skin: Skin is warm and dry. She is not diaphoretic.  Psychiatric: Her mood appears anxious.  Nursing note and vitals reviewed.    ED Treatments / Results  Labs (all labs ordered are listed, but only abnormal results are displayed) Labs Reviewed  COMPREHENSIVE METABOLIC PANEL - Abnormal; Notable for the following components:      Result Value   CO2 14 (*)    Glucose, Bld 427 (*)    Creatinine, Ser 1.36 (*)    Total Bilirubin 2.1 (*)    GFR calc non Af Amer 45 (*)    GFR calc Af Amer 52 (*)    Anion gap 20 (*)    All other components within normal limits  CBC WITH DIFFERENTIAL/PLATELET - Abnormal; Notable for the following components:   RDW 18.5 (*)    All other components within normal limits  CBG MONITORING, ED - Abnormal; Notable for the following components:    Glucose-Capillary 427 (*)    All other components within normal limits  LIPASE, BLOOD  URINALYSIS, ROUTINE W REFLEX MICROSCOPIC  BLOOD GAS, VENOUS  I-STAT BETA HCG BLOOD, ED ([facility], [facility], [facility] ONLY)   BUN  Date Value Ref Range Status  06/10/2017 18 6 - 20 mg/dL Final  06/03/2017 18 6 - 20 mg/dL Final  05/18/2017 14 6 - 20 mg/dL Final  05/17/2017 15 6 - 20 mg/dL Final  04/15/2017 14 4 - 21 Final  04/09/2017 15 4 - 21 Final   Creat  Date Value Ref Range Status  09/04/2016 1.11 (H) 0.50 - 1.10 mg/dL Final   Creatinine, Ser  Date Value Ref Range Status  06/10/2017 1.36 (H) 0.44 - 1.00 mg/dL Final  06/03/2017 0.98 0.44 - 1.00 mg/dL Final  05/18/2017 0.94 0.44 - 1.00 mg/dL Final  05/17/2017 1.46 (H) 0.44 - 1.00 mg/dL Final     EKG  EKG Interpretation  Date/Time:  Monday June 10 2017 14:27:25 EST Ventricular Rate:  110 PR Interval:    QRS Duration: 118 QT Interval:  365 QTC Calculation: 485 R Axis:   -60 Text Interpretation:  Sinus tachycardia Ventricular premature complex LAD, consider left anterior fascicular block Baseline wander in lead(s) II III aVL aVF V4 Confirmed by Nat Christen [phone]) on 06/10/2017 3:37:23 PM       Radiology No results found.  Procedures Procedures (including critical care time)  Medications Ordered in ED Medications  sodium chloride 0.9 % bolus 1,000 mL (not administered)  insulin regular (NOVOLIN R,HUMULIN R) 100 Units in sodium chloride 0.9 % 100 mL (1 Units/mL) infusion (3.7 Units/hr Intravenous New Bag/Given 06/10/17 1647)  potassium chloride 10 mEq in 100 mL IVPB (not administered)  dextrose 5 %-0.45 % sodium chloride infusion (not administered)  sodium chloride 0.9 % bolus 1,000 mL (1,000 mLs Intravenous New Bag/Given 06/10/17 1505)  ondansetron (ZOFRAN) injection 4 mg (4 mg Intravenous Given 06/10/17 1507)  LORazepam (ATIVAN) injection 1 mg (1 mg Intravenous Given 06/10/17 1624)  diphenhydrAMINE (BENADRYL) injection 25 mg (25 mg  Intravenous Given 06/10/17 1624)  Initial Impression / Assessment and Plan / ED Course  I have reviewed the triage vital signs and the nursing notes.  Pertinent labs & imaging results that were available during my care of the patient were reviewed by me and considered in my medical decision making (see chart for details).  Clinical Course as of Jun 10 1706  Mon Jun 10, 2017  1646 Patient continues to vomit, however, abdominal discomfort, anxiety, and tremoring have improved.   [SJ]  1655 Spoke with Dr. Maryland Pink, hospitalist. Agrees to admit the patient.   [SJ]    Clinical Course User Index [SJ] Aariana Shankland C, PA-C     Patient presents with nausea and vomiting.  Hyperglycemia, anion gap of 20, CO2 of 14, creatinine of 1.36.  Suspect DKA, complicated by gastroparesis. Patient continues to vomit. Admission for further management.   Findings and plan of care discussed with Nat Christen, MD. Dr. Lacinda Axon personally evaluated and examined this patient.  Vitals:   06/10/17 1122 06/10/17 1134 06/10/17 1500 06/10/17 1622  BP: 116/87  (!) 158/74 (!) 164/78  Pulse: 98  (!) 114 (!) 113  Resp: (!) 22  (!) 24 (!) 39  Temp: 98.5 F (36.9 C)   98.5 F (36.9 C)  TempSrc: Oral   Oral  SpO2: 100%  100% 100%  Weight:  63.5 kg (140 lb)    Height:  5\' 2"  (1.575 m)       Final Clinical Impressions(s) / ED Diagnoses   Final diagnoses:  Diabetic ketoacidosis without coma associated with type 2 diabetes mellitus [facility])    ED Discharge Orders    None       Layla Maw 06/10/17 1708    Nat Christen, MD 06/11/17 [phone]

## 2017-06-11 ENCOUNTER — Observation Stay (HOSPITAL_COMMUNITY): Payer: Medicaid Other

## 2017-06-11 ENCOUNTER — Encounter (HOSPITAL_COMMUNITY): Payer: Self-pay

## 2017-06-11 ENCOUNTER — Other Ambulatory Visit: Payer: Self-pay

## 2017-06-11 DIAGNOSIS — K5900 Constipation, unspecified: Secondary | ICD-10-CM | POA: Diagnosis present

## 2017-06-11 DIAGNOSIS — Z884 Allergy status to anesthetic agent status: Secondary | ICD-10-CM | POA: Diagnosis not present

## 2017-06-11 DIAGNOSIS — E1040 Type 1 diabetes mellitus with diabetic neuropathy, unspecified: Secondary | ICD-10-CM

## 2017-06-11 DIAGNOSIS — E860 Dehydration: Secondary | ICD-10-CM | POA: Diagnosis present

## 2017-06-11 DIAGNOSIS — R000 Tachycardia, unspecified: Secondary | ICD-10-CM | POA: Diagnosis present

## 2017-06-11 DIAGNOSIS — R112 Nausea with vomiting, unspecified: Secondary | ICD-10-CM | POA: Diagnosis not present

## 2017-06-11 DIAGNOSIS — G2401 Drug induced subacute dyskinesia: Secondary | ICD-10-CM | POA: Diagnosis present

## 2017-06-11 DIAGNOSIS — T450X5A Adverse effect of antiallergic and antiemetic drugs, initial encounter: Secondary | ICD-10-CM | POA: Diagnosis not present

## 2017-06-11 DIAGNOSIS — F419 Anxiety disorder, unspecified: Secondary | ICD-10-CM | POA: Diagnosis present

## 2017-06-11 DIAGNOSIS — E1010 Type 1 diabetes mellitus with ketoacidosis without coma: Secondary | ICD-10-CM | POA: Diagnosis present

## 2017-06-11 DIAGNOSIS — E1043 Type 1 diabetes mellitus with diabetic autonomic (poly)neuropathy: Secondary | ICD-10-CM | POA: Diagnosis present

## 2017-06-11 DIAGNOSIS — Y92230 Patient room in hospital as the place of occurrence of the external cause: Secondary | ICD-10-CM | POA: Diagnosis not present

## 2017-06-11 DIAGNOSIS — Z8249 Family history of ischemic heart disease and other diseases of the circulatory system: Secondary | ICD-10-CM | POA: Diagnosis not present

## 2017-06-11 DIAGNOSIS — D509 Iron deficiency anemia, unspecified: Secondary | ICD-10-CM | POA: Diagnosis present

## 2017-06-11 DIAGNOSIS — F329 Major depressive disorder, single episode, unspecified: Secondary | ICD-10-CM | POA: Diagnosis present

## 2017-06-11 DIAGNOSIS — Z880 Allergy status to penicillin: Secondary | ICD-10-CM | POA: Diagnosis not present

## 2017-06-11 DIAGNOSIS — G249 Dystonia, unspecified: Secondary | ICD-10-CM | POA: Diagnosis not present

## 2017-06-11 DIAGNOSIS — R300 Dysuria: Secondary | ICD-10-CM | POA: Diagnosis present

## 2017-06-11 DIAGNOSIS — E785 Hyperlipidemia, unspecified: Secondary | ICD-10-CM | POA: Diagnosis present

## 2017-06-11 DIAGNOSIS — K3184 Gastroparesis: Secondary | ICD-10-CM

## 2017-06-11 DIAGNOSIS — I10 Essential (primary) hypertension: Secondary | ICD-10-CM | POA: Diagnosis present

## 2017-06-11 DIAGNOSIS — E1143 Type 2 diabetes mellitus with diabetic autonomic (poly)neuropathy: Secondary | ICD-10-CM

## 2017-06-11 DIAGNOSIS — K219 Gastro-esophageal reflux disease without esophagitis: Secondary | ICD-10-CM | POA: Diagnosis present

## 2017-06-11 DIAGNOSIS — Z79899 Other long term (current) drug therapy: Secondary | ICD-10-CM | POA: Diagnosis not present

## 2017-06-11 DIAGNOSIS — Z888 Allergy status to other drugs, medicaments and biological substances status: Secondary | ICD-10-CM | POA: Diagnosis not present

## 2017-06-11 DIAGNOSIS — Z833 Family history of diabetes mellitus: Secondary | ICD-10-CM | POA: Diagnosis not present

## 2017-06-11 DIAGNOSIS — Z794 Long term (current) use of insulin: Secondary | ICD-10-CM | POA: Diagnosis not present

## 2017-06-11 DIAGNOSIS — N179 Acute kidney failure, unspecified: Secondary | ICD-10-CM | POA: Diagnosis present

## 2017-06-11 LAB — COMPREHENSIVE METABOLIC PANEL
ALBUMIN: 3.5 g/dL (ref 3.5–5.0)
ALT: 10 U/L — AB (ref 14–54)
ANION GAP: 4 — AB (ref 5–15)
AST: 14 U/L — ABNORMAL LOW (ref 15–41)
Alkaline Phosphatase: 68 U/L (ref 38–126)
BUN: 18 mg/dL (ref 6–20)
CHLORIDE: 115 mmol/L — AB (ref 101–111)
CO2: 21 mmol/L — AB (ref 22–32)
CREATININE: 1.16 mg/dL — AB (ref 0.44–1.00)
Calcium: 8.8 mg/dL — ABNORMAL LOW (ref 8.9–10.3)
GFR calc non Af Amer: 54 mL/min — ABNORMAL LOW (ref >60)
GLUCOSE: 96 mg/dL (ref 65–99)
Potassium: 3.6 mmol/L (ref 3.5–5.1)
SODIUM: 140 mmol/L (ref 135–145)
Total Bilirubin: 1 mg/dL (ref 0.3–1.2)
Total Protein: 6.5 g/dL (ref 6.5–8.1)

## 2017-06-11 LAB — CBG MONITORING, ED
GLUCOSE-CAPILLARY: 103 mg/dL — AB (ref 65–99)
GLUCOSE-CAPILLARY: 139 mg/dL — AB (ref 65–99)
GLUCOSE-CAPILLARY: 202 mg/dL — AB (ref 65–99)
Glucose-Capillary: 131 mg/dL — ABNORMAL HIGH (ref 65–99)
Glucose-Capillary: 90 mg/dL (ref 65–99)
Glucose-Capillary: 181 mg/dL — ABNORMAL HIGH (ref 65–99)

## 2017-06-11 LAB — BASIC METABOLIC PANEL
ANION GAP: 7 (ref 5–15)
BUN: 17 mg/dL (ref 6–20)
CALCIUM: 8.5 mg/dL — AB (ref 8.9–10.3)
CO2: 19 mmol/L — AB (ref 22–32)
Chloride: 109 mmol/L (ref 101–111)
Creatinine, Ser: 1.21 mg/dL — ABNORMAL HIGH (ref 0.44–1.00)
GFR calc non Af Amer: 51 mL/min — ABNORMAL LOW (ref >60)
GFR, EST AFRICAN AMERICAN: 59 mL/min — AB (ref >60)
Glucose, Bld: 209 mg/dL — ABNORMAL HIGH (ref 65–99)
POTASSIUM: 4.2 mmol/L (ref 3.5–5.1)
Sodium: 135 mmol/L (ref 135–145)

## 2017-06-11 LAB — CBC
HCT: 33.9 % — ABNORMAL LOW (ref 36.0–46.0)
HEMOGLOBIN: 11.2 g/dL — AB (ref 12.0–15.0)
MCH: 26.6 pg (ref 26.0–34.0)
MCHC: 33 g/dL (ref 30.0–36.0)
MCV: 80.5 fL (ref 78.0–100.0)
Platelets: 264 10*3/uL (ref 150–400)
RBC: 4.21 MIL/uL (ref 3.87–5.11)
RDW: 18.8 % — ABNORMAL HIGH (ref 11.5–15.5)
WBC: 10 10*3/uL (ref 4.0–10.5)

## 2017-06-11 LAB — GLUCOSE, CAPILLARY
GLUCOSE-CAPILLARY: 277 mg/dL — AB (ref 65–99)
Glucose-Capillary: 168 mg/dL — ABNORMAL HIGH (ref 65–99)

## 2017-06-11 MED ORDER — SODIUM CHLORIDE 0.9 % IV SOLN: INTRAVENOUS | Status: DC

## 2017-06-11 MED ORDER — ONDANSETRON HCL 4 MG/2ML IJ SOLN: 4.0000 mg | Freq: Four times a day (QID) | INTRAMUSCULAR | Status: DC | PRN

## 2017-06-11 MED ORDER — POLYETHYLENE GLYCOL 3350 17 G PO PACK
17.0000 g | PACK | Freq: Two times a day (BID) | ORAL | Status: DC
  Administered 2017-06-11 – 2017-06-12 (×2): 17 g via ORAL
  Filled 2017-06-11 (×2): qty 1

## 2017-06-11 MED ORDER — CLONAZEPAM 0.5 MG PO TBDP
0.5000 mg | ORAL_TABLET | Freq: Two times a day (BID) | ORAL | Status: DC
  Administered 2017-06-11 – 2017-06-12 (×2): 0.5 mg via ORAL
  Filled 2017-06-11: qty 4
  Filled 2017-06-11: qty 1

## 2017-06-11 MED ORDER — POTASSIUM CHLORIDE 10 MEQ/100ML IV SOLN
10.0000 meq | INTRAVENOUS | Status: AC
  Administered 2017-06-11 (×2): 10 meq via INTRAVENOUS
  Filled 2017-06-11 (×2): qty 100

## 2017-06-11 MED ORDER — ACETAMINOPHEN 650 MG RE SUPP
650.0000 mg | Freq: Four times a day (QID) | RECTAL | Status: DC | PRN
Start: 2017-06-11 — End: 2017-06-12

## 2017-06-11 MED ORDER — BISACODYL 10 MG RE SUPP
10.0000 mg | Freq: Once | RECTAL | Status: AC
  Administered 2017-06-11: 10 mg via RECTAL
  Filled 2017-06-11: qty 1

## 2017-06-11 MED ORDER — ONDANSETRON HCL 4 MG PO TABS
4.0000 mg | ORAL_TABLET | Freq: Four times a day (QID) | ORAL | Status: DC | PRN
  Administered 2017-06-11: 4 mg via ORAL
  Filled 2017-06-11: qty 1

## 2017-06-11 MED ORDER — HEPARIN SODIUM (PORCINE) 5000 UNIT/ML IJ SOLN
5000.0000 [IU] | Freq: Three times a day (TID) | INTRAMUSCULAR | Status: DC
  Administered 2017-06-11 – 2017-06-12 (×4): 5000 [IU] via SUBCUTANEOUS
  Filled 2017-06-11 (×4): qty 1

## 2017-06-11 MED ORDER — INSULIN REGULAR HUMAN 100 UNIT/ML IJ SOLN
INTRAMUSCULAR | Status: DC
  Filled 2017-06-11: qty 1

## 2017-06-11 MED ORDER — ERYTHROMYCIN BASE 250 MG PO TABS
250.0000 mg | ORAL_TABLET | Freq: Three times a day (TID) | ORAL | Status: DC
  Administered 2017-06-11 – 2017-06-12 (×4): 250 mg via ORAL
  Filled 2017-06-11 (×7): qty 1

## 2017-06-11 MED ORDER — BENZTROPINE MESYLATE 0.5 MG PO TABS
1.0000 mg | ORAL_TABLET | Freq: Two times a day (BID) | ORAL | Status: DC
  Administered 2017-06-11 – 2017-06-12 (×3): 1 mg via ORAL
  Filled 2017-06-11: qty 1
  Filled 2017-06-11 (×2): qty 2

## 2017-06-11 MED ORDER — BUSPIRONE HCL 5 MG PO TABS
10.0000 mg | ORAL_TABLET | Freq: Three times a day (TID) | ORAL | Status: DC
  Administered 2017-06-11 – 2017-06-12 (×4): 10 mg via ORAL
  Filled 2017-06-11: qty 1
  Filled 2017-06-11 (×3): qty 2

## 2017-06-11 MED ORDER — INSULIN DETEMIR 100 UNIT/ML ~~LOC~~ SOLN
6.0000 [IU] | Freq: Two times a day (BID) | SUBCUTANEOUS | Status: DC
  Administered 2017-06-11 – 2017-06-12 (×3): 6 [IU] via SUBCUTANEOUS
  Filled 2017-06-11 (×4): qty 0.06

## 2017-06-11 MED ORDER — INSULIN ASPART 100 UNIT/ML ~~LOC~~ SOLN
0.0000 [IU] | Freq: Four times a day (QID) | SUBCUTANEOUS | Status: DC
  Administered 2017-06-11: 5 [IU] via SUBCUTANEOUS
  Administered 2017-06-11 (×2): 3 [IU] via SUBCUTANEOUS
  Administered 2017-06-12: 0 [IU] via SUBCUTANEOUS
  Administered 2017-06-12: 1 [IU] via SUBCUTANEOUS
  Filled 2017-06-11 (×2): qty 1

## 2017-06-11 MED ORDER — SENNA 8.6 MG PO TABS
1.0000 | ORAL_TABLET | Freq: Every day | ORAL | Status: DC
  Administered 2017-06-12: 8.6 mg via ORAL
  Filled 2017-06-11: qty 1

## 2017-06-11 MED ORDER — DIPHENHYDRAMINE HCL 25 MG PO CAPS
50.0000 mg | ORAL_CAPSULE | Freq: Once | ORAL | Status: AC
  Administered 2017-06-11: 50 mg via ORAL
  Filled 2017-06-11: qty 2

## 2017-06-11 MED ORDER — PREGABALIN 75 MG PO CAPS
150.0000 mg | ORAL_CAPSULE | Freq: Two times a day (BID) | ORAL | Status: DC
  Administered 2017-06-11 – 2017-06-12 (×3): 150 mg via ORAL
  Filled 2017-06-11: qty 3
  Filled 2017-06-11 (×2): qty 2

## 2017-06-11 MED ORDER — MORPHINE SULFATE (PF) 2 MG/ML IV SOLN: 2.0000 mg | INTRAVENOUS | Status: DC | PRN

## 2017-06-11 MED ORDER — DEXTROSE-NACL 5-0.45 % IV SOLN: INTRAVENOUS | Status: DC

## 2017-06-11 MED ORDER — ACETAMINOPHEN 325 MG PO TABS: 650.0000 mg | ORAL_TABLET | Freq: Four times a day (QID) | ORAL | Status: DC | PRN

## 2017-06-11 MED ORDER — LORAZEPAM 2 MG/ML IJ SOLN
0.5000 mg | Freq: Four times a day (QID) | INTRAMUSCULAR | Status: DC | PRN
  Administered 2017-06-11: 0.5 mg via INTRAVENOUS
  Filled 2017-06-11: qty 1

## 2017-06-11 NOTE — Progress Notes (Signed)
PROGRESS NOTE  [name] OHY:[id number] DOB: 02-16-1966 DOA: 06/10/2017 PCP: [name], MD  Brief Narrative: 71yow PMH DM with gastroparesis, neuropathy; hospitalization 04/2017 for severe gastroparesis; presented with n/v/diffuse abdominal pain. Admitted for DKA, AKI  Assessment/Plan DKA, known DM. Precipitating factor unclear. Normal LFTs (except tbili, lipase, WBC on admission; abd exam benign on admission. U/a and urine pregnancy negative. - AG closed 0400 but insulin gtt stopped earlier. Will repeat BMP now and if gap remainsclosed will change to SQ insulin. - AKI resolving with IVF. LFTs unremarkable. Afebrile, no leukocytosis.  DM type 1 with gastroparesis, neuropathy. HgbA1c 10.1 Sept/2018. - Levemir 8units BID when off insulin gtt - continue pregabaline  Acute on chronic diabetic gastroparesis with associated abd pain, n/v; complicated by constipation. - improving slightly but still with pain and only desire for ice chips - PMH dystonic reaction to Reglan. - continue EES. Resume mestinon (Rx by gastroparesis clinic) when able to take PO.. Followed by [facility] gastroparesis clinic.  Essential HTN? - on Lasix at home.  Anxiety, depression, tardive dyskinesia - benztropine, buspirone, clonazepam  Constipation - aggressive bowel regimen   Check STAT BMP, transition to SQ insulin when ready. If gap still closed with change to med surg status  Allow ice chips, advance to clears if gap closed  Not ready with d/cwith ongoing pain and inability to take adequate PO. Will change to inpatient.  DVT prophylaxis: heparin Code Status: full Family Communication: none Disposition Plan: home    Murray Hodgkins, MD  [facility] Direct contact: [phone] --[address] [place]  --[email or web address]; password [email or web address]  7PM-7AM contact night coverage as above 06/11/2017, 7:55 AM  LOS: 0 days   Consultants:    Procedures:    Antimicrobials:    Interval  history/Subjective: Feels a little better. No n/v. Wants some ice chips. Still has diffuse abdominal pain. No BM in 6 days.  Objective: Vitals:  Vitals:   06/11/17 0600 06/11/17 0706  BP: 139/77 130/68  Pulse: 95 92  Resp: 17 16  Temp:    SpO2: 100% 100%    Exam:  Constitutional:  . Appears calm, ill, uncomfortable;rhythmic movements of lips, tongue,abdomen Eyes:  . pupils and irises appear normal . Normal lids  ENMT:  . grossly normal hearing  . Lips appear normal . Oropharynx: mucosa, tongue appear unremarkable Respiratory:  . CTA bilaterally, no w/r/r.  . Respiratory effort normal.  Cardiovascular:  . RRR, no m/r/g . No LE extremity edema   Abdomen:  . Abdomen appears normal; mild periumbilical pain. No rebound or guarding. . No hernias noted. Musculoskeletal:  . Digits/nails BUE: no clubbing, cyanosis, petechiae, infection . RUE, LUE, RLE, LLE   o strength and tone normal, no atrophy Skin:  . No rashes, lesions, ulcers . palpation of skin: no induration or nodules Psychiatric:  . Mental status o Mood, affect appropriate  I have personally reviewed the following:  Filed Weights   06/10/17 1134  Weight: 63.5 kg (140 lb)   Weight change:   UOP: unrecorded I/O since admission: unrecorded UOP Last BM charted: none Foley: no Telemetry: in ED Status: SDU >> medsurg if gap still closed  Labs:  creatinine 1.36 >> 1.16  tbili now WNL  CBC unremarkable  Imaging studies: CXR independent review, no acute disease AXR moderate to large colonic stool burden suggesting constipation.  Medical tests:  EKG poor quality, ST   Review and summation of old records:  Discharge 11/24. Severe diabetic gastroparesis  Discharge 11/5. DKA, abd pain,  n/v.  Office Dr. Derrill Kay 10/17. DM gastroparesis, perhaps SB bacterial overgrowth, constipation. Suggested better glucose control, started Mestinon 30mg  TID.  Discharge 10/10. DKA. DM gastroparesis. Required Cortrak  tube. Acute dystonic reaction. Seen by GI: severe gastropresis, rec EES, Zofran, Ativan PRN  Discharge 9/26. DKA.  Discharge 9/14. DKA.  Scheduled Meds: . benztropine  1 mg Oral BID  . busPIRone  10 mg Oral TID  . clonazePAM  0.5 mg Oral BID  . erythromycin base  250 mg Oral TID PC  . heparin  5,000 Units Subcutaneous Q8H  . polyethylene glycol  17 g Oral BID  . pregabalin  150 mg Oral BID   Continuous Infusions: . sodium chloride    . dextrose 5 % and 0.45% NaCl 125 mL/hr at 06/11/17 0330  . insulin (NOVOLIN-R) infusion Stopped (06/11/17 0330)    Principal Problem:   DKA (diabetic ketoacidoses) (HCC) Active Problems:   Diabetic gastroparesis ([place])   Essential hypertension   LOS: 0 days

## 2017-06-11 NOTE — ED Notes (Signed)
Meal tray given 

## 2017-06-11 NOTE — ED Notes (Signed)
ADMISSION Provider at bedside. 

## 2017-06-11 NOTE — ED Notes (Signed)
[facility] - TRANSPORT PAGED AND EN ROUTE TO TRANSPORT PT. PT MADE AWARE OF PLAN AND ADMISSION

## 2017-06-11 NOTE — ED Notes (Signed)
ED TO INPATIENT HANDOFF REPORT  Name/Age/Gender [name] 51 y.o. female  Code Status    Code Status Orders  (From admission, onward)        Start     Ordered   06/11/17 0325  Full code  Continuous     06/11/17 0324    Code Status History    Date Active Date Inactive Code Status Order ID Comments User Context   05/16/2017 17:56 05/18/2017 18:32 Full Code [id number]  Geradine Girt, DO Inpatient   05/06/2017 11:30 05/09/2017 20:49 Full Code [id number]  Donne Hazel, MD ED   03/25/2017 07:42 04/04/2017 01:44 Full Code [id number]  Kandice Hams, MD Inpatient   03/25/2017 06:53 03/25/2017 07:42 Full Code [id number]  Kandice Hams, MD Inpatient   03/16/2017 06:06 03/20/2017 20:40 Full Code [id number]  Jani Gravel, MD Inpatient   03/05/2017 18:36 03/09/2017 16:26 Full Code [id number]  Bonnielee Haff, MD Inpatient   02/05/2017 11:18 02/06/2017 17:06 Full Code [id number]  Merton Border, MD Inpatient   01/31/2017 21:22 02/03/2017 18:47 Full Code [id number]  Vashti Hey, MD Inpatient   09/27/2016 18:33 09/29/2016 20:30 Full Code [id number]  Doreatha Lew, MD Inpatient   04/20/2016 18:09 04/23/2016 19:37 Full Code [id number]  Hosie Poisson, MD Inpatient   04/06/2016 13:50 04/09/2016 18:29 Full Code [id number]  Albertine Patricia, MD ED   03/23/2016 01:22 03/26/2016 21:01 Full Code [id number]  Vianne Bulls, MD ED   03/18/2016 15:59 03/20/2016 17:41 Full Code [id number]  Mariel Aloe, MD Inpatient   02/19/2016 14:16 02/21/2016 17:32 Full Code [id number]  Caren Griffins, MD ED   01/24/2016 07:01 01/26/2016 20:01 Full Code [id number]  Karmen Bongo, MD Inpatient   01/19/2016 14:23 01/21/2016 17:38 Full Code [id number]  Thurnell Lose, MD ED   11/27/2015 22:01 12/01/2015 18:39 Full Code [id number]  Toy Baker, MD Inpatient   11/14/2015 04:05 11/16/2015 19:31 Full Code [id number]  Ivor Costa, MD ED   09/02/2015 12:33 09/05/2015 22:12 Full Code [id number]  Kelvin Cellar, MD ED    07/29/2015 07:55 08/01/2015 23:03 Full Code [id number]  Reyne Dumas, MD ED   06/29/2015 20:27 07/03/2015 17:58 Full Code [id number]  Robbie Lis, MD Inpatient   06/22/2015 10:58 06/25/2015 18:47 Full Code [id number]  Radene Gunning, NP Inpatient   06/14/2015 18:29 06/20/2015 21:40 Full Code [id number]  Domenic Polite, MD Inpatient   05/26/2015 22:02 06/05/2015 19:29 Full Code [id number]  Reubin Milan, MD Inpatient   05/23/2015 07:17 05/24/2015 18:19 Full Code [id number]  Norval Morton, MD Inpatient   05/17/2015 12:21 05/20/2015 18:13 Full Code [id number]  Eugenie Filler, MD Inpatient   05/17/2015 11:09 05/17/2015 12:21 Full Code [id number]  Eugenie Filler, MD ED   04/19/2015 02:38 04/20/2015 22:29 Full Code [id number]  Rise Patience, MD Inpatient   04/05/2015 05:43 04/09/2015 19:25 Full Code [id number]  Rise Patience, MD Inpatient   04/05/2015 05:37 04/05/2015 05:43 Full Code [id number]  Despina Hick, RN Inpatient   03/09/2015 07:42 03/12/2015 19:11 Full Code [id number]  Theodis Blaze, MD Inpatient   02/20/2015 01:08 02/22/2015 19:08 Full Code [id number]  Toy Baker, MD Inpatient   02/08/2015 23:43 02/13/2015 15:32 Full Code [id number]  Lavina Hamman, MD ED   10/21/2014 00:57 10/23/2014 15:14 Full Code [id number]  Rise Patience, MD Inpatient   02/12/2013 23:16 02/17/2013 21:22 Full Code [id number]  Quintella Baton, MD Inpatient   11/25/2012 13:48 11/28/2012 19:04 Full Code [id number]  Regalado, Belkys A,  MD ED   11/05/2012 17:37 11/07/2012 16:20 Full Code [id number]  Sheila Oats, MD Inpatient   04/03/2012 02:27 04/05/2012 16:43 Full Code [id number]  Quintella Baton, MD Inpatient   03/24/2012 00:21 03/28/2012 17:51 Full Code [id number]  Leeroy Cha, RN ED      Home/SNF/Other Home  Chief Complaint emesis/hyperglycemia  Level of Care/Admitting Diagnosis ED Disposition    ED Disposition Condition [facility] Area: [facility]  [[id number]]  Level of Care: Med-Surg [16]  Diagnosis: Diabetic gastroparesis ([place]) [470962]  Admitting Physician: [place], [place]  Attending Physician: Samuella Cota [[id number]]  Estimated length of stay: past midnight tomorrow  Certification:: I certify this patient will need inpatient services for at least 2 midnights  PT Class (Do Not Modify): Inpatient [101]  PT Acc Code (Do Not Modify): Private [1]       Medical History Past Medical History:  Diagnosis Date  . Depression   . Diabetes mellitus without complication ([place])   . Essential hypertension   . Gastroparesis   . GERD (gastroesophageal reflux disease)   . HLD (hyperlipidemia)     Allergies Allergies  Allergen Reactions  . Anesthetics, Amide Nausea And Vomiting  . Penicillins Diarrhea and Nausea And Vomiting    Has patient had a PCN reaction causing immediate rash, facial/tongue/throat swelling, SOB or lightheadedness with hypotension: Yes Has patient had a PCN reaction causing severe rash involving mucus membranes or skin necrosis: No Has patient had a PCN reaction that required hospitalization No Has patient had a PCN reaction occurring within the last 10 years: Yes  If all of the above answers are "NO", then may proceed with Cephalosporin use.   . Buprenorphine Hcl Rash  . Encainide Nausea And Vomiting  . Metoclopramide Other (See Comments)    Dystonia, muscle rigidity.     IV Location/Drains/Wounds Patient Lines/Drains/Airways Status   Active Line/Drains/Airways    Name:   Placement date:   Placement time:   Site:   Days:   Peripheral IV 06/10/17 Left Arm   06/10/17    1442    Arm   1          Labs/Imaging Results for orders placed or performed during the hospital encounter of 06/10/17 (from the past 48 hour(s))  Comprehensive metabolic panel     Status: Abnormal   Collection Time: 06/10/17  2:37 PM  Result Value Ref Range   Sodium 136 135 - 145 mmol/L   Potassium 4.3 3.5 - 5.1 mmol/L    Chloride 102 101 - 111 mmol/L   CO2 14 (L) 22 - 32 mmol/L   Glucose, Bld 427 (H) 65 - 99 mg/dL   BUN 18 6 - 20 mg/dL   Creatinine, Ser 1.36 (H) 0.44 - 1.00 mg/dL   Calcium 9.4 8.9 - 10.3 mg/dL   Total Protein 7.7 6.5 - 8.1 g/dL   Albumin 3.9 3.5 - 5.0 g/dL   AST 22 15 - 41 U/L   ALT 14 14 - 54 U/L   Alkaline Phosphatase 86 38 - 126 U/L   Total Bilirubin 2.1 (H) 0.3 - 1.2 mg/dL   GFR calc non Af Amer 45 (L) >60 mL/min   GFR calc Af Amer 52 (L) >60 mL/min    Comment: (NOTE) The eGFR has been calculated using the CKD EPI equation. This calculation has not been validated in all clinical situations. eGFR's persistently <60 mL/min signify possible Chronic Kidney Disease.  Anion gap 20 (H) 5 - 15  CBC with Differential     Status: Abnormal   Collection Time: 06/10/17  2:37 PM  Result Value Ref Range   WBC 9.4 4.0 - 10.5 K/uL   RBC 4.79 3.87 - 5.11 MIL/uL   Hemoglobin 12.9 12.0 - 15.0 g/dL   HCT 38.8 36.0 - 46.0 %   MCV 81.0 78.0 - 100.0 fL   MCH 26.9 26.0 - 34.0 pg   MCHC 33.2 30.0 - 36.0 g/dL   RDW 18.5 (H) 11.5 - 15.5 %   Platelets 331 150 - 400 K/uL   Neutrophils Relative % 77 %   Neutro Abs 7.3 1.7 - 7.7 K/uL   Lymphocytes Relative 18 %   Lymphs Abs 1.7 0.7 - 4.0 K/uL   Monocytes Relative 5 %   Monocytes Absolute 0.5 0.1 - 1.0 K/uL   Eosinophils Relative 0 %   Eosinophils Absolute 0.0 0.0 - 0.7 K/uL   Basophils Relative 0 %   Basophils Absolute 0.0 0.0 - 0.1 K/uL  Lipase, blood     Status: None   Collection Time: 06/10/17  2:37 PM  Result Value Ref Range   Lipase 22 11 - 51 U/L  I-Stat beta hCG blood, ED     Status: None   Collection Time: 06/10/17  2:49 PM  Result Value Ref Range   I-stat hCG, quantitative <5.0 <5 mIU/mL   Comment 3            Comment:   GEST. AGE      CONC.  (mIU/mL)   <=1 WEEK        5 - 50     2 WEEKS       50 - 500     3 WEEKS       100 - 10,000     4 WEEKS     1,000 - 30,000        FEMALE AND NON-PREGNANT FEMALE:     LESS THAN 5 mIU/mL    CBG monitoring, ED     Status: Abnormal   Collection Time: 06/10/17  4:19 PM  Result Value Ref Range   Glucose-Capillary 427 (H) 65 - 99 mg/dL  Urinalysis, Routine w reflex microscopic     Status: Abnormal   Collection Time: 06/10/17  4:28 PM  Result Value Ref Range   Color, Urine YELLOW YELLOW   APPearance CLEAR CLEAR   Specific Gravity, Urine 1.023 1.005 - 1.030   pH 5.0 5.0 - 8.0   Glucose, UA >=500 (A) NEGATIVE mg/dL   Hgb urine dipstick NEGATIVE NEGATIVE   Bilirubin Urine NEGATIVE NEGATIVE   Ketones, ur 80 (A) NEGATIVE mg/dL   Protein, ur 30 (A) NEGATIVE mg/dL   Nitrite NEGATIVE NEGATIVE   Leukocytes, UA NEGATIVE NEGATIVE   RBC / HPF 0-5 0 - 5 RBC/hpf   WBC, UA 0-5 0 - 5 WBC/hpf   Bacteria, UA NONE SEEN NONE SEEN   Squamous Epithelial / LPF 0-5 (A) NONE SEEN  CBG monitoring, ED     Status: Abnormal   Collection Time: 06/10/17  6:00 PM  Result Value Ref Range   Glucose-Capillary 359 (H) 65 - 99 mg/dL  CBG monitoring, ED     Status: Abnormal   Collection Time: 06/10/17  7:17 PM  Result Value Ref Range   Glucose-Capillary 270 (H) 65 - 99 mg/dL  CBG monitoring, ED     Status: Abnormal   Collection Time: 06/10/17  8:37 PM  Result Value Ref Range   Glucose-Capillary 214 (H) 65 - 99 mg/dL  CBG monitoring, ED     Status: Abnormal   Collection Time: 06/10/17 10:38 PM  Result Value Ref Range   Glucose-Capillary 201 (H) 65 - 99 mg/dL  CBG monitoring, ED     Status: Abnormal   Collection Time: 06/10/17 11:32 PM  Result Value Ref Range   Glucose-Capillary 151 (H) 65 - 99 mg/dL  CBG monitoring, ED     Status: Abnormal   Collection Time: 06/11/17 12:04 AM  Result Value Ref Range   Glucose-Capillary 147 (H) 65 - 99 mg/dL  CBG monitoring, ED     Status: Abnormal   Collection Time: 06/11/17  1:10 AM  Result Value Ref Range   Glucose-Capillary 131 (H) 65 - 99 mg/dL  CBG monitoring, ED     Status: Abnormal   Collection Time: 06/11/17  2:40 AM  Result Value Ref Range    Glucose-Capillary 103 (H) 65 - 99 mg/dL  Comprehensive metabolic panel     Status: Abnormal   Collection Time: 06/11/17  4:18 AM  Result Value Ref Range   Sodium 140 135 - 145 mmol/L   Potassium 3.6 3.5 - 5.1 mmol/L   Chloride 115 (H) 101 - 111 mmol/L   CO2 21 (L) 22 - 32 mmol/L   Glucose, Bld 96 65 - 99 mg/dL   BUN 18 6 - 20 mg/dL   Creatinine, Ser 1.16 (H) 0.44 - 1.00 mg/dL   Calcium 8.8 (L) 8.9 - 10.3 mg/dL   Total Protein 6.5 6.5 - 8.1 g/dL   Albumin 3.5 3.5 - 5.0 g/dL   AST 14 (L) 15 - 41 U/L   ALT 10 (L) 14 - 54 U/L   Alkaline Phosphatase 68 38 - 126 U/L   Total Bilirubin 1.0 0.3 - 1.2 mg/dL   GFR calc non Af Amer 54 (L) >60 mL/min   GFR calc Af Amer >60 >60 mL/min    Comment: (NOTE) The eGFR has been calculated using the CKD EPI equation. This calculation has not been validated in all clinical situations. eGFR's persistently <60 mL/min signify possible Chronic Kidney Disease.    Anion gap 4 (L) 5 - 15  CBC     Status: Abnormal   Collection Time: 06/11/17  4:18 AM  Result Value Ref Range   WBC 10.0 4.0 - 10.5 K/uL   RBC 4.21 3.87 - 5.11 MIL/uL   Hemoglobin 11.2 (L) 12.0 - 15.0 g/dL   HCT 33.9 (L) 36.0 - 46.0 %   MCV 80.5 78.0 - 100.0 fL   MCH 26.6 26.0 - 34.0 pg   MCHC 33.0 30.0 - 36.0 g/dL   RDW 18.8 (H) 11.5 - 15.5 %   Platelets 264 150 - 400 K/uL  CBG monitoring, ED     Status: None   Collection Time: 06/11/17  4:31 AM  Result Value Ref Range   Glucose-Capillary 90 65 - 99 mg/dL  CBG monitoring, ED     Status: Abnormal   Collection Time: 06/11/17  5:55 AM  Result Value Ref Range   Glucose-Capillary 139 (H) 65 - 99 mg/dL  CBG monitoring, ED     Status: Abnormal   Collection Time: 06/11/17  8:32 AM  Result Value Ref Range   Glucose-Capillary 181 (H) 65 - 99 mg/dL  Basic metabolic panel     Status: Abnormal   Collection Time: 06/11/17  8:51 AM  Result Value Ref Range   Sodium 135 135 -  145 mmol/L   Potassium 4.2 3.5 - 5.1 mmol/L   Chloride 109 101 - 111  mmol/L   CO2 19 (L) 22 - 32 mmol/L   Glucose, Bld 209 (H) 65 - 99 mg/dL   BUN 17 6 - 20 mg/dL   Creatinine, Ser 1.21 (H) 0.44 - 1.00 mg/dL   Calcium 8.5 (L) 8.9 - 10.3 mg/dL   GFR calc non Af Amer 51 (L) >60 mL/min   GFR calc Af Amer 59 (L) >60 mL/min    Comment: (NOTE) The eGFR has been calculated using the CKD EPI equation. This calculation has not been validated in all clinical situations. eGFR's persistently <60 mL/min signify possible Chronic Kidney Disease.    Anion gap 7 5 - 15  CBG monitoring, ED     Status: Abnormal   Collection Time: 06/11/17 12:07 PM  Result Value Ref Range   Glucose-Capillary 202 (H) 65 - 99 mg/dL   Dg Chest Port 1 View  Result Date: 06/11/2017 CLINICAL DATA:  Nausea and vomiting for 2 days. EXAM: PORTABLE CHEST 1 VIEW COMPARISON:  Radiograph 03/25/2017 FINDINGS: Patient's chain obscures both apices. The cardiomediastinal contours are normal. The lungs are clear. Pulmonary vasculature is normal. No consolidation, pleural effusion, or pneumothorax. No acute osseous abnormalities are seen. IMPRESSION: No acute abnormality. Electronically Signed   By: Jeb Levering M.D.   On: 06/11/2017 03:55   Dg Abd Portable 2v  Result Date: 06/11/2017 CLINICAL DATA:  Nausea and vomiting for 2 days. EXAM: PORTABLE ABDOMEN - 2 VIEW COMPARISON:  Radiographs 03/30/2017 FINDINGS: No bowel dilatation to suggest obstruction. No evidence of free air, overlying monitoring devices partially limit evaluation. Moderate to large colonic stool burden. No abnormal rectal distention. Cholecystectomy clips in the right upper quadrant. No abnormal soft tissue calcifications. IMPRESSION: Moderate to large colonic stool burden suggesting constipation. No bowel obstruction or evidence of free air. Electronically Signed   By: Jeb Levering M.D.   On: 06/11/2017 03:54    Pending Labs Unresulted Labs (From admission, onward)   Start     Ordered   06/12/17 4098  Basic metabolic panel   Tomorrow morning,   R     06/11/17 0951      Vitals/Pain Today's Vitals   06/11/17 1300 06/11/17 1303 06/11/17 1400 06/11/17 1430  BP: (!) 106/59 (!) 106/59 128/72 110/61  Pulse: 93 91 91 94  Resp: (!) _0 (!) 22  Temp:      TempSrc:      SpO2: 98% 98% 100% 100%  Weight:      Height:      PainSc:        Isolation Precautions No active isolations  Medications Medications  potassium chloride 10 mEq in 100 mL IVPB (10 mEq Intravenous Not Given 06/11/17 0420)  benztropine (COGENTIN) tablet 1 mg (1 mg Oral Given 06/11/17 1046)  busPIRone (BUSPAR) tablet 10 mg (10 mg Oral Given 06/11/17 1044)  clonazepam (KLONOPIN) disintegrating tablet 0.5 mg (0.5 mg Oral Given 06/11/17 1043)  erythromycin (E-MYCIN) tablet 250 mg (250 mg Oral Given 06/11/17 1432)  polyethylene glycol (MIRALAX / GLYCOLAX) packet 17 g (17 g Oral Given 06/11/17 1045)  pregabalin (LYRICA) capsule 150 mg (150 mg Oral Given 06/11/17 1044)  heparin injection 5,000 Units (5,000 Units Subcutaneous Given 06/11/17 1433)  acetaminophen (TYLENOL) tablet 650 mg (not administered)    Or  acetaminophen (TYLENOL) suppository 650 mg (not administered)  ondansetron (ZOFRAN) tablet 4 mg (4 mg Oral Given 06/11/17 1100)  Or  ondansetron (ZOFRAN) injection 4 mg ( Intravenous See Alternative 06/11/17 1100)  morphine 2 MG/ML injection 2 mg (not administered)  LORazepam (ATIVAN) injection 0.5 mg (0.5 mg Intravenous Given 06/11/17 0418)  senna (SENOKOT) tablet 8.6 mg (not administered)  insulin detemir (LEVEMIR) injection 6 Units (6 Units Subcutaneous Given 06/11/17 1042)  insulin aspart (novoLOG) injection 0-9 Units (3 Units Subcutaneous Given 06/11/17 1429)  sodium chloride 0.9 % bolus 1,000 mL (0 mLs Intravenous Stopped 06/10/17 2102)  ondansetron (ZOFRAN) injection 4 mg (4 mg Intravenous Given 06/10/17 1507)  sodium chloride 0.9 % bolus 1,000 mL (0 mLs Intravenous Stopped 06/11/17 0422)  LORazepam (ATIVAN) injection 1 mg (1  mg Intravenous Given 06/10/17 1624)  diphenhydrAMINE (BENADRYL) injection 25 mg (25 mg Intravenous Given 06/10/17 1624)  potassium chloride 10 mEq in 100 mL IVPB (0 mEq Intravenous Stopped 06/11/17 0633)  ondansetron (ZOFRAN) injection 4 mg (4 mg Intravenous Given 06/10/17 2305)  diphenhydrAMINE (BENADRYL) capsule 50 mg (50 mg Oral Given 06/11/17 0548)  bisacodyl (DULCOLAX) suppository 10 mg (10 mg Rectal Given 06/11/17 1045)    Mobility walks with person assist

## 2017-06-11 NOTE — ED Notes (Signed)
Bed: WA18 Expected date:  Expected time:  Means of arrival:  Comments: 

## 2017-06-11 NOTE — ED Notes (Signed)
DIABETES R VAUGHEN RN UPDATED PT CURRENT STATUS

## 2017-06-12 LAB — GLUCOSE, CAPILLARY
GLUCOSE-CAPILLARY: 139 mg/dL — AB (ref 65–99)
Glucose-Capillary: 91 mg/dL (ref 65–99)

## 2017-06-12 LAB — BASIC METABOLIC PANEL
ANION GAP: 6 (ref 5–15)
BUN: 10 mg/dL (ref 6–20)
CALCIUM: 8.7 mg/dL — AB (ref 8.9–10.3)
CO2: 20 mmol/L — AB (ref 22–32)
CREATININE: 0.86 mg/dL (ref 0.44–1.00)
Chloride: 110 mmol/L (ref 101–111)
GLUCOSE: 87 mg/dL (ref 65–99)
Potassium: 4.1 mmol/L (ref 3.5–5.1)
Sodium: 136 mmol/L (ref 135–145)

## 2017-06-12 MED ORDER — PYRIDOSTIGMINE BROMIDE 60 MG PO TABS
30.0000 mg | ORAL_TABLET | Freq: Three times a day (TID) | ORAL | Status: DC
  Filled 2017-06-12 (×4): qty 0.5

## 2017-06-12 NOTE — Progress Notes (Signed)
PROGRESS NOTE  [name] DVV:[id number] DOB: 11-24-1965 DOA: 06/10/2017 PCP: [name], MD  Brief Narrative: 27yow PMH DM with gastroparesis, neuropathy; hospitalization 04/2017 for severe gastroparesis; presented with n/v/diffuse abdominal pain. Admitted for DKA, AKI  Assessment/Plan DKA, known DM. Precipitating factor unclear. Normal LFTs (except tbili), lipase, WBC on admission; abd exam benign on admission. U/a and urine pregnancy negative. - resolved  DM type 1 with gastroparesis, neuropathy. HgbA1c 10.1 Sept/2018. - stable, continue Levemir - continue pregabaline  Acute on chronic diabetic gastroparesis with associated abd pain, n/v; complicated by constipation. - acute component appears resolved. Will advance diet. - PMH dystonic reaction to Reglan. - continue EES. Resume mestinon (Rx by gastroparesis clinic). Followed by [facility] gastroparesis clinic.  Essential HTN? - on Lasix at home.  Anxiety, depression, tardive dyskinesia - continue benztropine, buspirone, clonazepam  Constipation - resolving; continue aggressive bowel regimen   Much better. Advance diet, if tolerates, home in 24 hours  DVT prophylaxis: heparin Code Status: full Family Communication: none Disposition Plan: home    Murray Hodgkins, MD  [facility] Direct contact: [phone] --[place]  --[email or web address]; password [email or web address]  7PM-7AM contact night coverage as above 06/12/2017, 8:16 AM  LOS: 1 day   Consultants:    Procedures:    Antimicrobials:    Interval history/Subjective: Feels better, slept well, no vomiting/nausea, no pain. Hungry. Bowels have moved.  Objective: Vitals:  Vitals:   06/11/17 1933 06/12/17 0418  BP: 129/72 105/82  Pulse: 89 72  Resp: 20 18  Temp: 98.2 F (36.8 C) 97.9 F (36.6 C)  SpO2: 99% 100%    Exam:  Constitutional:   . Appears calm and comfortable. Appears much better today Eyes:  . pupils and irises appear  normal . Normal lids  ENMT:  . grossly normal hearing  . Lips appear normal . Oropharynx: mucosa, tongue appear normal Respiratory:  . CTA bilaterally, no w/r/r.  . Respiratory effort normal.  Cardiovascular:  . RRR, no m/r/g . No LE extremity edema   Abdomen:  . Soft, ntnd Psychiatric:  . Mental status o Mood, affect appropriate . judgement and insight appear normal     I have personally reviewed the following:  Filed Weights   06/10/17 1134 06/11/17 1525  Weight: 63.5 kg (140 lb) 61 kg (134 lb 7.7 oz)   Weight change: -2.504 kg (-8.3 oz)  UOP: unrecorded I/O since admission: unrecorded UOP Last BM charted: 12/18 Foley: no Telemetry: no Status: med surg  Labs:  creatinine 1.36 >> 1.16 >> 0.86  Review and summation of old records:  Discharge 11/24. Severe diabetic gastroparesis  Discharge 11/5. DKA, abd pain, n/v.  Office Dr. Derrill Kay 10/17. DM gastroparesis, perhaps SB bacterial overgrowth, constipation. Suggested better glucose control, started Mestinon 30mg  TID.  Discharge 10/10. DKA. DM gastroparesis. Required Cortrak tube. Acute dystonic reaction. Seen by GI: severe gastropresis, rec EES, Zofran, Ativan PRN  Discharge 9/26. DKA.  Discharge 9/14. DKA.  Scheduled Meds: . benztropine  1 mg Oral BID  . busPIRone  10 mg Oral TID  . clonazePAM  0.5 mg Oral BID  . erythromycin base  250 mg Oral TID PC  . heparin  5,000 Units Subcutaneous Q8H  . insulin aspart  0-9 Units Subcutaneous Q6H  . insulin detemir  6 Units Subcutaneous BID  . polyethylene glycol  17 g Oral BID  . pregabalin  150 mg Oral BID  . senna  1 tablet Oral QHS   Continuous Infusions:   Principal Problem:  DKA, type 1 ([place]) Active Problems:   Diabetic neuropathy, type I diabetes mellitus ([place])   Diabetic gastroparesis ([place])   Essential hypertension   Tardive dyskinesia   LOS: 1 day

## 2017-06-12 NOTE — Progress Notes (Signed)
Nutrition Brief Note  Patient identified on the Malnutrition Screening Tool (MST) Report  Pt's weight is stable. Pt reports feeling hungry and is currently eating 100% of meals.   Wt Readings from Last 15 Encounters:  06/11/17 134 lb 7.7 oz (61 kg)  05/16/17 131 lb 6.3 oz (59.6 kg)  05/06/17 140 lb (63.5 kg)  04/30/17 139 lb 12.8 oz (63.4 kg)  04/26/17 137 lb (62.1 kg)  04/15/17 138 lb 9.6 oz (62.9 kg)  04/04/17 135 lb 3.2 oz (61.3 kg)  04/03/17 140 lb 11.2 oz (63.8 kg)  03/17/17 136 lb 0.4 oz (61.7 kg)  03/05/17 132 lb 4.4 oz (60 kg)  02/01/17 128 lb 1.4 oz (58.1 kg)  01/30/17 135 lb 6.4 oz (61.4 kg)  01/17/17 137 lb 12.8 oz (62.5 kg)  12/31/16 149 lb (67.6 kg)  10/30/16 142 lb (64.4 kg)    Body mass index is 24.6 kg/m. Patient meets criteria for normal based on current BMI.   Current diet order is CHO modfied, patient is consuming approximately 100% of meals at this time. Labs and medications reviewed.   No nutrition interventions warranted at this time. If nutrition issues arise, please consult RD.   Clayton Bibles, MS, RD, [place] Dietitian Pager: [phone] After Hours Pager: [phone]

## 2017-06-12 NOTE — Discharge Summary (Signed)
Physician Discharge Summary  [name] YSA:[id number] DOB: Nov 12, 1965 DOA: 06/10/2017  PCP: [name], MD  Admit date: 06/10/2017 Discharge date: 06/12/2017  Recommendations for Outpatient Follow-up:  1. DKA, DM type 1 with gastroparesis, neuropathy 2. Recurrent admissions for gastroparesis, consider referral to GI for consideration of advanced treatment   Follow-up Information    [name], MD. Schedule an appointment as soon as possible for a visit in 1 week(s).   Specialty:  Family Medicine Contact information: [place] [address] [phone]            Discharge Diagnoses:  1. DKA, known DM 2. DM type 1 with gastroparesis, neuropathy 3. Acute on chronic diabetic gastroparesis with associated abd pain, n/v 4. Anxiety, depression, tardive dyskinesia 5. Constipation  Discharge Condition: improved Disposition: home  Diet recommendation: diabetic gastroparesis diet  Filed Weights   06/10/17 1134 06/11/17 1525  Weight: 63.5 kg (140 lb) 61 kg (134 lb 7.7 oz)    History of present illness:  89yow PMH DM with gastroparesis, neuropathy; hospitalization 04/2017 for severe gastroparesis; presented with n/v/diffuse abdominal pain. Admitted for DKA, AKI  Hospital Course:  Patient responded briskly to standard treatment and DKA and vomiting resolved.   DKA, known DM. Precipitating factor unclear. Normal LFTs (except tbili), lipase, WBC on admission; abd exam benign on admission. U/a and urine pregnancy negative. - resolved  DM type 1 with gastroparesis, neuropathy. HgbA1c 10.1 Sept/2018. - stable, continue Levemir - continue pregabaline  Acute on chronic diabetic gastroparesis with associated abd pain, n/v; complicated by constipation. - acute component appears resolved. Tolerating diet - PMH dystonic reaction to Reglan. - continue EES. Resume mestinon (Rx by gastroparesis clinic). Followed by [facility] gastroparesis clinic until  lost insurance.  Anxiety, depression, tardive dyskinesia - continue benztropine, buspirone, clonazepam  Constipation - resolving; continue aggressive bowel regimen   Discharge Instructions  Discharge Instructions    Diet Carb Modified   Complete by:  As directed    Discharge instructions   Complete by:  As directed    Call your physician or seek immediate medical attention for vomiting, pain, difficulty eating, confusion or worsening of condition.   Increase activity slowly   Complete by:  As directed      Allergies as of 06/12/2017      Reactions   Anesthetics, Amide Nausea And Vomiting   Penicillins Diarrhea, Nausea And Vomiting   Has patient had a PCN reaction causing immediate rash, facial/tongue/throat swelling, SOB or lightheadedness with hypotension: Yes Has patient had a PCN reaction causing severe rash involving mucus membranes or skin necrosis: No Has patient had a PCN reaction that required hospitalization No Has patient had a PCN reaction occurring within the last 10 years: Yes  If all of the above answers are "NO", then may proceed with Cephalosporin use.   Buprenorphine Hcl Rash   Encainide Nausea And Vomiting   Metoclopramide Other (See Comments)   Dystonia, muscle rigidity.       Medication List    TAKE these medications   benztropine 1 MG tablet Commonly known as:  COGENTIN Take 1 tablet (1 mg total) by mouth 2 (two) times daily.   busPIRone 10 MG tablet Commonly known as:  BUSPAR Take 1 tablet (10 mg total) by mouth 3 (three) times daily.   clonazePAM 0.5 MG disintegrating tablet Commonly known as:  KLONOPIN Take 1 tablet (0.5 mg total) by mouth 2 (two) times daily.   erythromycin base 500 MG tablet Commonly known as:  E-MYCIN Take 0.5 tablets (250 mg total) by mouth 3 (three) times daily after meals.   finasteride 5 MG tablet Commonly known as:  PROSCAR Take 1 tablet (5 mg total) by mouth daily.   furosemide 20 MG tablet Commonly known  as:  LASIX Take 1 tablet (20 mg total) by mouth daily as needed for fluid or edema (as needed for swelling).   Insulin Detemir 100 UNIT/ML Pen Commonly known as:  LEVEMIR FLEXPEN Inject 12 Units into the skin 2 (two) times daily.   NOVOLOG FLEXPEN 100 UNIT/ML FlexPen Generic drug:  insulin aspart 6 Units. Inject as per sliding scale subcutaneously before meals and at bedtime 70 - 119 = 0 units 120 - 150 = 2 units 151 - 180 = 4 units 181 - 210 = 5 units Greater than 400 notify MD 211 - 240 = 6 units 241 - 270 = 8 units 271 - 300 = 9 units 301 - 330 = 10 units 331 - 360 = 11 units 361 - 400 = 12 units   ondansetron 8 MG disintegrating tablet Commonly known as:  ZOFRAN-ODT Take 1 tablet (8 mg total) by mouth every 8 (eight) hours as needed for nausea or vomiting.   pantoprazole 40 MG tablet Commonly known as:  PROTONIX Take 1 tablet (40 mg total) 2 (two) times daily by mouth.   polyethylene glycol packet Commonly known as:  MIRALAX / GLYCOLAX Take 17 g by mouth daily as needed for mild constipation.   pregabalin 150 MG capsule Commonly known as:  LYRICA Take 150 mg 2 (two) times daily by mouth.   pyridostigmine 60 MG tablet Commonly known as:  MESTINON Take 30 mg by mouth 3 (three) times daily.   sodium bicarbonate 650 MG tablet Take 1 tablet (650 mg total) daily by mouth.      Allergies  Allergen Reactions  . Anesthetics, Amide Nausea And Vomiting  . Penicillins Diarrhea and Nausea And Vomiting    Has patient had a PCN reaction causing immediate rash, facial/tongue/throat swelling, SOB or lightheadedness with hypotension: Yes Has patient had a PCN reaction causing severe rash involving mucus membranes or skin necrosis: No Has patient had a PCN reaction that required hospitalization No Has patient had a PCN reaction occurring within the last 10 years: Yes  If all of the above answers are "NO", then may proceed with Cephalosporin use.   . Buprenorphine Hcl Rash    . Encainide Nausea And Vomiting  . Metoclopramide Other (See Comments)    Dystonia, muscle rigidity.     The results of significant diagnostics from this hospitalization (including imaging, microbiology, ancillary and laboratory) are listed below for reference.    Significant Diagnostic Studies: Dg Chest Port 1 View  Result Date: 06/11/2017 CLINICAL DATA:  Nausea and vomiting for 2 days. EXAM: PORTABLE CHEST 1 VIEW COMPARISON:  Radiograph 03/25/2017 FINDINGS: Patient's chain obscures both apices. The cardiomediastinal contours are normal. The lungs are clear. Pulmonary vasculature is normal. No consolidation, pleural effusion, or pneumothorax. No acute osseous abnormalities are seen. IMPRESSION: No acute abnormality. Electronically Signed   By: Jeb Levering M.D.   On: 06/11/2017 03:55   Dg Abd Portable 2v  Result Date: 06/11/2017 CLINICAL DATA:  Nausea and vomiting for 2 days. EXAM: PORTABLE ABDOMEN - 2 VIEW COMPARISON:  Radiographs 03/30/2017 FINDINGS: No bowel dilatation to suggest obstruction. No evidence of free air, overlying monitoring devices partially limit evaluation. Moderate to large colonic stool burden. No abnormal rectal distention. Cholecystectomy clips in  the right upper quadrant. No abnormal soft tissue calcifications. IMPRESSION: Moderate to large colonic stool burden suggesting constipation. No bowel obstruction or evidence of free air. Electronically Signed   By: Jeb Levering M.D.   On: 06/11/2017 03:54    Microbiology: Recent Results (from the past 240 hour(s))  Urine culture     Status: Abnormal   Collection Time: 06/03/17 12:43 PM  Result Value Ref Range Status   Specimen Description URINE, CLEAN CATCH  Final   Special Requests Immunocompromised  Final   Culture MULTIPLE SPECIES PRESENT, SUGGEST RECOLLECTION (A)  Final   Report Status 06/04/2017 FINAL  Final     Labs: Basic Metabolic Panel: Recent Labs  Lab 06/10/17 1437 06/11/17 0418 06/11/17 0851  06/12/17 0536  NA 136 140 135 136  K 4.3 3.6 4.2 4.1  CL 102 115* 109 110  CO2 14* 21* 19* 20*  GLUCOSE 427* 96 209* 87  BUN 18 18 17 10   CREATININE 1.36* 1.16* 1.21* 0.86  CALCIUM 9.4 8.8* 8.5* 8.7*   Liver Function Tests: Recent Labs  Lab 06/10/17 1437 06/11/17 0418  AST 22 14*  ALT 14 10*  ALKPHOS 86 68  BILITOT 2.1* 1.0  PROT 7.7 6.5  ALBUMIN 3.9 3.5   Recent Labs  Lab 06/10/17 1437  LIPASE 22   CBC: Recent Labs  Lab 06/10/17 1437 06/11/17 0418  WBC 9.4 10.0  NEUTROABS 7.3  --   HGB 12.9 11.2*  HCT 38.8 33.9*  MCV 81.0 80.5  PLT 331 264    CBG: Recent Labs  Lab 06/11/17 1207 06/11/17 1826 06/11/17 2349 06/12/17 0542 06/12/17 1236  GLUCAP 202* 277* 168* 16 139*    Principal Problem:   DKA, type 1 ([place]) Active Problems:   Diabetic neuropathy, type I diabetes mellitus ([place])   Diabetic gastroparesis ([place])   Essential hypertension   Tardive dyskinesia   Time coordinating discharge: 45 minutes  Signed:  Murray Hodgkins, MD [facility] 06/12/2017, 3:33 PM

## 2017-06-15 ENCOUNTER — Emergency Department (HOSPITAL_COMMUNITY): Payer: Medicaid Other

## 2017-06-15 ENCOUNTER — Inpatient Hospital Stay (HOSPITAL_COMMUNITY)
Admission: EM | Admit: 2017-06-15 | Discharge: 2017-06-17 | DRG: 639 | Disposition: A | Discharge status: Home / Self Care | Payer: Medicaid Other | Attending: Internal Medicine | Admitting: Internal Medicine

## 2017-06-15 DIAGNOSIS — Z794 Long term (current) use of insulin: Secondary | ICD-10-CM

## 2017-06-15 DIAGNOSIS — Z888 Allergy status to other drugs, medicaments and biological substances status: Secondary | ICD-10-CM

## 2017-06-15 DIAGNOSIS — Z79899 Other long term (current) drug therapy: Secondary | ICD-10-CM

## 2017-06-15 DIAGNOSIS — E860 Dehydration: Secondary | ICD-10-CM | POA: Diagnosis present

## 2017-06-15 DIAGNOSIS — I10 Essential (primary) hypertension: Secondary | ICD-10-CM | POA: Diagnosis present

## 2017-06-15 DIAGNOSIS — Z880 Allergy status to penicillin: Secondary | ICD-10-CM

## 2017-06-15 DIAGNOSIS — E1043 Type 1 diabetes mellitus with diabetic autonomic (poly)neuropathy: Secondary | ICD-10-CM | POA: Diagnosis present

## 2017-06-15 DIAGNOSIS — G2401 Drug induced subacute dyskinesia: Secondary | ICD-10-CM | POA: Diagnosis present

## 2017-06-15 DIAGNOSIS — Z885 Allergy status to narcotic agent status: Secondary | ICD-10-CM

## 2017-06-15 DIAGNOSIS — E1143 Type 2 diabetes mellitus with diabetic autonomic (poly)neuropathy: Secondary | ICD-10-CM

## 2017-06-15 DIAGNOSIS — K3184 Gastroparesis: Secondary | ICD-10-CM | POA: Diagnosis present

## 2017-06-15 DIAGNOSIS — E1010 Type 1 diabetes mellitus with ketoacidosis without coma: Principal | ICD-10-CM | POA: Diagnosis present

## 2017-06-15 DIAGNOSIS — K219 Gastro-esophageal reflux disease without esophagitis: Secondary | ICD-10-CM | POA: Diagnosis present

## 2017-06-15 DIAGNOSIS — Z833 Family history of diabetes mellitus: Secondary | ICD-10-CM

## 2017-06-15 DIAGNOSIS — Z91128 Patient's intentional underdosing of medication regimen for other reason: Secondary | ICD-10-CM

## 2017-06-15 DIAGNOSIS — T383X6A Underdosing of insulin and oral hypoglycemic [antidiabetic] drugs, initial encounter: Secondary | ICD-10-CM | POA: Diagnosis present

## 2017-06-15 DIAGNOSIS — Z884 Allergy status to anesthetic agent status: Secondary | ICD-10-CM

## 2017-06-15 DIAGNOSIS — F329 Major depressive disorder, single episode, unspecified: Secondary | ICD-10-CM | POA: Diagnosis present

## 2017-06-15 DIAGNOSIS — T450X5S Adverse effect of antiallergic and antiemetic drugs, sequela: Secondary | ICD-10-CM

## 2017-06-15 DIAGNOSIS — E1110 Type 2 diabetes mellitus with ketoacidosis without coma: Secondary | ICD-10-CM | POA: Diagnosis present

## 2017-06-15 DIAGNOSIS — E1069 Type 1 diabetes mellitus with other specified complication: Secondary | ICD-10-CM | POA: Diagnosis present

## 2017-06-15 DIAGNOSIS — E785 Hyperlipidemia, unspecified: Secondary | ICD-10-CM | POA: Diagnosis present

## 2017-06-15 LAB — CBC WITH DIFFERENTIAL/PLATELET
BASOS ABS: 0 10*3/uL (ref 0.0–0.1)
Basophils Relative: 0 %
Eosinophils Absolute: 0 10*3/uL (ref 0.0–0.7)
Eosinophils Relative: 0 %
HEMATOCRIT: 37.2 % (ref 36.0–46.0)
HEMOGLOBIN: 12.5 g/dL (ref 12.0–15.0)
LYMPHS PCT: 12 %
Lymphs Abs: 1 10*3/uL (ref 0.7–4.0)
MCH: 26.8 pg (ref 26.0–34.0)
MCHC: 33.6 g/dL (ref 30.0–36.0)
MCV: 79.8 fL (ref 78.0–100.0)
Monocytes Absolute: 0.5 10*3/uL (ref 0.1–1.0)
Monocytes Relative: 5 %
NEUTROS ABS: 7.4 10*3/uL (ref 1.7–7.7)
NEUTROS PCT: 83 %
Platelets: 320 10*3/uL (ref 150–400)
RBC: 4.66 MIL/uL (ref 3.87–5.11)
RDW: 18.4 % — ABNORMAL HIGH (ref 11.5–15.5)
WBC: 8.9 10*3/uL (ref 4.0–10.5)

## 2017-06-15 LAB — COMPREHENSIVE METABOLIC PANEL
ALBUMIN: 3.8 g/dL (ref 3.5–5.0)
ALT: 18 U/L (ref 14–54)
ANION GAP: 19 — AB (ref 5–15)
AST: 22 U/L (ref 15–41)
Alkaline Phosphatase: 80 U/L (ref 38–126)
BILIRUBIN TOTAL: 2.1 mg/dL — AB (ref 0.3–1.2)
BUN: 16 mg/dL (ref 6–20)
CO2: 15 mmol/L — ABNORMAL LOW (ref 22–32)
Calcium: 9.4 mg/dL (ref 8.9–10.3)
Chloride: 102 mmol/L (ref 101–111)
Creatinine, Ser: 1.27 mg/dL — ABNORMAL HIGH (ref 0.44–1.00)
GFR, EST AFRICAN AMERICAN: 56 mL/min — AB (ref >60)
GFR, EST NON AFRICAN AMERICAN: 48 mL/min — AB (ref >60)
GLUCOSE: 429 mg/dL — AB (ref 65–99)
POTASSIUM: 4 mmol/L (ref 3.5–5.1)
Sodium: 136 mmol/L (ref 135–145)
TOTAL PROTEIN: 7.3 g/dL (ref 6.5–8.1)

## 2017-06-15 LAB — I-STAT BETA HCG BLOOD, ED (MC, WL, AP ONLY): I-stat hCG, quantitative: <5 m[IU]/mL (ref <5)

## 2017-06-15 LAB — CBG MONITORING, ED: GLUCOSE-CAPILLARY: 338 mg/dL — AB (ref 65–99)

## 2017-06-15 MED ORDER — HALOPERIDOL LACTATE 5 MG/ML IJ SOLN
5.0000 mg | Freq: Once | INTRAMUSCULAR | Status: AC
  Administered 2017-06-15: 5 mg via INTRAMUSCULAR
  Filled 2017-06-15: qty 1

## 2017-06-15 MED ORDER — LORAZEPAM 2 MG/ML IJ SOLN
1.0000 mg | Freq: Once | INTRAMUSCULAR | Status: AC
  Administered 2017-06-15: 1 mg via INTRAMUSCULAR
  Filled 2017-06-15: qty 1

## 2017-06-15 MED ORDER — PROMETHAZINE HCL 25 MG/ML IJ SOLN
25.0000 mg | Freq: Once | INTRAMUSCULAR | Status: DC
  Filled 2017-06-15: qty 1

## 2017-06-15 MED ORDER — LIDOCAINE HCL 1 % IJ SOLN
10.0000 mL | Freq: Once | INTRAMUSCULAR | Status: AC
  Administered 2017-06-16: 10 mL

## 2017-06-15 MED ORDER — HALOPERIDOL LACTATE 5 MG/ML IJ SOLN
5.0000 mg | Freq: Once | INTRAMUSCULAR | Status: DC
  Filled 2017-06-15: qty 1

## 2017-06-15 MED ORDER — SODIUM CHLORIDE 0.9 % IV BOLUS (SEPSIS)
1000.0000 mL | Freq: Once | INTRAVENOUS | Status: AC
  Administered 2017-06-16: 1000 mL via INTRAVENOUS

## 2017-06-15 MED ORDER — PROMETHAZINE HCL 25 MG/ML IJ SOLN
25.0000 mg | Freq: Once | INTRAMUSCULAR | Status: AC
  Administered 2017-06-15: 25 mg via INTRAMUSCULAR
  Filled 2017-06-15: qty 1

## 2017-06-15 MED ORDER — SODIUM CHLORIDE 0.9 % IV SOLN
INTRAVENOUS | Status: DC
  Administered 2017-06-16: 2.8 [IU]/h via INTRAVENOUS
  Filled 2017-06-15: qty 1

## 2017-06-15 MED ORDER — LIDOCAINE HCL (PF) 2 % IJ SOLN
INTRAMUSCULAR | Status: AC
  Filled 2017-06-15: qty 10

## 2017-06-15 MED ORDER — POTASSIUM CHLORIDE 10 MEQ/100ML IV SOLN: 10.0000 meq | INTRAVENOUS | Status: DC

## 2017-06-15 NOTE — ED Notes (Signed)
cbg 338

## 2017-06-15 NOTE — ED Notes (Signed)
Writer made two unsuccessful attempts to draw labs. 

## 2017-06-15 NOTE — ED Notes (Signed)
Bed: WA17 Expected date: 06/15/17 Expected time: 7:18 PM Means of arrival: Ambulance Comments: Hyperglycemia

## 2017-06-15 NOTE — ED Provider Notes (Signed)
H/o DM, ? Compliance Recent admission for DKA (d/ch 12/19), frequent episodes Here with similar symptoms ?taking less insulin because she is not eating Poor venous access, getting central line Appears to be recurrent DKA  Plan: admission when IV access obtained and glucostabilizer started  Patient has a central line established by Dr. Darl Householder. IV fluids running and glucostabilizer started. Will page hospitalist for admission.   Charlann Lange, PA-C 06/15/17 2354    Drenda Freeze, MD 06/16/17 [phone]

## 2017-06-15 NOTE — ED Provider Notes (Signed)
[facility] DEPT Provider Note   CSN: [id number] Arrival date & time: 06/15/17  1922   History   Chief Complaint Chief Complaint  Patient presents with  . Emesis    HPI [name] is a 51 y.o. female who presents with abdominal pain, N/V. PMH significant for Type 1 DM, hx of DKA, hx of gastroparesis, HTN, GERD, tardive dyskinesia. The patient states her pain started today. It feels similar to prior episodes of gastroparesis. She has been using less of her insulin because she hasn't been eating as much. She reports associated chills, nausea, vomiting. No fever, chest pain, SOB, diarrhea, urinary symptoms.  She was recently admitted from 12/17-12/19 due to DKA and AKI.  HPI  Past Medical History:  Diagnosis Date  . Depression   . Diabetes mellitus without complication ([place])   . Essential hypertension   . Gastroparesis   . GERD (gastroesophageal reflux disease)   . HLD (hyperlipidemia)     Patient Active Problem List   Diagnosis Date Noted  . Severe protein-calorie malnutrition ([place]) 04/17/2017  . Acute dystonic reaction due to drugs 04/01/2017  . Serotonin syndrome 03/25/2017  . Tardive dyskinesia   . Tachycardia 03/16/2017  . Diarrhea 03/05/2017  . DKA, type 1 ([place]) 03/18/2016  . Noncompliance with treatment plan 03/13/2016  . Essential hypertension 01/24/2016  . Abnormal ECG 11/27/2015  . HLD (hyperlipidemia)   . GERD (gastroesophageal reflux disease)   . Anemia, iron deficiency   . Vitamin B12 deficiency 08/16/2015  . Diabetic gastroparesis ([place])   . Diabetic neuropathy, type I diabetes mellitus ([place]) 05/18/2015  . Anxiety and depression 05/18/2015    Past Surgical History:  Procedure Laterality Date  . COLONOSCOPY  09/27/2014   at [facility]  . ESOPHAGOGASTRODUODENOSCOPY  09/27/2014   at [facility], Dr Rolan Lipa. biospy neg for celiac, neg for H pylori.   . EYE SURGERY    . gailstones    . IR FLUORO GUIDE CV LINE RIGHT  02/01/2017    . IR FLUORO GUIDE CV LINE RIGHT  03/06/2017  . IR FLUORO GUIDE CV LINE RIGHT  03/25/2017  . IR GENERIC HISTORICAL  01/24/2016   IR FLUORO GUIDE CV LINE RIGHT 01/24/2016 Darrell K Allred, PA-C [facility]-INTERV RAD  . IR GENERIC HISTORICAL  01/24/2016   IR US GUIDE VASC ACCESS RIGHT 01/24/2016 Darrell K Allred, PA-C [facility]  . IR US GUIDE VASC ACCESS RIGHT  02/01/2017  . IR US GUIDE VASC ACCESS RIGHT  03/06/2017  . IR US GUIDE VASC ACCESS RIGHT  03/25/2017  . POSTERIOR VITRECTOMY AND MEMBRANE PEEL-LEFT EYE  09/28/2002  . POSTERIOR VITRECTOMY AND MEMBRANE PEEL-RIGHT EYE  03/16/2002  . RETINAL DETACHMENT SURGERY      OB History    No data available       Home Medications    Prior to Admission medications   Medication Sig Start Date End Date Taking? Authorizing Provider  benztropine (COGENTIN) 1 MG tablet Take 1 tablet (1 mg total) by mouth 2 (two) times daily. 04/03/17   Dessa Phi, DO  busPIRone (BUSPAR) 10 MG tablet Take 1 tablet (10 mg total) by mouth 3 (three) times daily. 05/18/17   Roxan Hockey, MD  clonazePAM (KLONOPIN) 0.5 MG disintegrating tablet Take 1 tablet (0.5 mg total) by mouth 2 (two) times daily. 04/15/17   Gerlene Fee, NP  erythromycin base (E-MYCIN) 500 MG tablet Take 0.5 tablets (250 mg total) by mouth 3 (three) times daily after meals. 05/18/17 05/18/18  Roxan Hockey, MD  finasteride (PROSCAR) 5 MG tablet Take 1 tablet (5 mg total) by mouth daily. 04/04/17   Dessa Phi, DO  furosemide (LASIX) 20 MG tablet Take 1 tablet (20 mg total) by mouth daily as needed for fluid or edema (as needed for swelling). 05/18/17   Roxan Hockey, MD  insulin aspart (NOVOLOG FLEXPEN) 100 UNIT/ML FlexPen 6 Units. Inject as per sliding scale subcutaneously before meals and at bedtime 70 - 119 = 0 units 120 - 150 = 2 units 151 - 180 = 4 units 181 - 210 = 5 units Greater than 400 notify MD 211 - 240 = 6 units 241 - 270 = 8 units 271 - 300 = 9 units 301 - 330 = 10 units 331 -  360 = 11 units 361 - 400 = 12 units     [provider]  Insulin Detemir (LEVEMIR FLEXPEN) 100 UNIT/ML Pen Inject 12 Units into the skin 2 (two) times daily. 05/18/17   Roxan Hockey, MD  ondansetron (ZOFRAN-ODT) 8 MG disintegrating tablet Take 1 tablet (8 mg total) by mouth every 8 (eight) hours as needed for nausea or vomiting. 06/03/17   Law, Bea Graff, PA-C  pantoprazole (PROTONIX) 40 MG tablet Take 1 tablet (40 mg total) 2 (two) times daily by mouth. 05/09/17   Barton Dubois, MD  polyethylene glycol (MIRALAX / Floria Raveling) packet Take 17 g by mouth daily as needed for mild constipation.  04/11/17   [provider]  pregabalin (LYRICA) 150 MG capsule Take 150 mg 2 (two) times daily by mouth. 05/05/15   [provider]  pyridostigmine (MESTINON) 60 MG tablet Take 30 mg by mouth 3 (three) times daily.     [provider]  sodium bicarbonate 650 MG tablet Take 1 tablet (650 mg total) daily by mouth. 05/10/17   Barton Dubois, MD    Family History Family History  Problem Relation Age of Onset  . Cystic fibrosis Mother   . Hypertension Father   . Diabetes Brother   . Hypertension Maternal Grandmother     Social History Social History   Tobacco Use  . Smoking status: Never Smoker  . Smokeless tobacco: Never Used  Substance Use Topics  . Alcohol use: No  . Drug use: No     Allergies   Anesthetics, amide; Penicillins; Buprenorphine hcl; Encainide; and Metoclopramide   Review of Systems Review of Systems  Constitutional: Positive for appetite change and chills. Negative for fever.  Respiratory: Negative for shortness of breath.   Cardiovascular: Negative for chest pain.  Gastrointestinal: Positive for abdominal pain, nausea and vomiting. Negative for diarrhea.  Genitourinary: Negative for dysuria.  All other systems reviewed and are negative.    Physical Exam Updated Vital Signs LMP 05/16/2017 Comment: neg preg test  Physical Exam    Constitutional: She is oriented to person, place, and time. She appears well-developed and well-nourished. She appears distressed (Rocking back and forth, occaisionally vomiting).  HENT:  Head: Normocephalic and atraumatic.  Eyes: Conjunctivae are normal. Pupils are equal, round, and reactive to light. Right eye exhibits no discharge. Left eye exhibits no discharge. No scleral icterus.  Neck: Normal range of motion.  Cardiovascular: Tachycardia present. Exam reveals no gallop and no friction rub.  No murmur heard. Pulmonary/Chest: Effort normal and breath sounds normal. No stridor. No respiratory distress. She has no wheezes. She has no rales. She exhibits no tenderness.  Abdominal: Soft. Bowel sounds are normal. She exhibits no distension. There is tenderness (generalized).  Neurological: She is alert and oriented to person, place, and time.  Skin: Skin is warm and dry.  Psychiatric: She has a normal mood and affect. Her behavior is normal.  Nursing note and vitals reviewed.    ED Treatments / Results  Labs (all labs ordered are listed, but only abnormal results are displayed) Labs Reviewed  COMPREHENSIVE METABOLIC PANEL - Abnormal; Notable for the following components:      Result Value   CO2 15 (*)    Glucose, Bld 429 (*)    Creatinine, Ser 1.27 (*)    Total Bilirubin 2.1 (*)    GFR calc non Af Amer 48 (*)    GFR calc Af Amer 56 (*)    Anion gap 19 (*)    All other components within normal limits  CBC WITH DIFFERENTIAL/PLATELET - Abnormal; Notable for the following components:   RDW 18.4 (*)    All other components within normal limits  URINALYSIS, ROUTINE W REFLEX MICROSCOPIC  BLOOD GAS, VENOUS  I-STAT BETA HCG BLOOD, ED ([facility], [facility], [facility] ONLY)  I-STAT VENOUS BLOOD GAS, ED    EKG  EKG Interpretation  Date/Time:  Saturday June 15 2017 20:18:36 EST Ventricular Rate:  111 PR Interval:    QRS Duration: 118 QT Interval:  359 QTC Calculation: 493 R Axis:   -43 Text  Interpretation:  Sinus tachycardia Nonspecific IVCD with LAD Anterior infarct, old Nonspecific T abnormalities, inferior leads No significant change since last tracing Confirmed by Wandra Arthurs [phone]) on 06/15/2017 8:36:47 PM       Radiology No results found.  Procedures Procedures (including critical care time)  CRITICAL CARE Performed by: Recardo Evangelist   Total critical care time: 35 minutes  Critical care time was exclusive of separately billable procedures and treating other patients.  Critical care was necessary to treat or prevent imminent or life-threatening deterioration.  Critical care was time spent personally by me on the following activities: development of treatment plan with patient and/or surrogate as well as nursing, discussions with consultants, evaluation of patient's response to treatment, examination of patient, obtaining history from patient or surrogate, ordering and performing treatments and interventions, ordering and review of laboratory studies, ordering and review of radiographic studies, pulse oximetry and re-evaluation of patient's condition.   Medications Ordered in ED Medications  sodium chloride 0.9 % bolus 1,000 mL (1,000 mLs Intravenous Not Given 06/15/17 2145)  promethazine (PHENERGAN) injection 25 mg (not administered)  haloperidol lactate (HALDOL) injection 5 mg (not administered)  insulin regular (NOVOLIN R,HUMULIN R) 100 Units in sodium chloride 0.9 % 100 mL (1 Units/mL) infusion (not administered)  potassium chloride 10 mEq in 100 mL IVPB (not administered)  LORazepam (ATIVAN) injection 1 mg (1 mg Intramuscular Given 06/15/17 2034)     Initial Impression / Assessment and Plan / ED Course  I have reviewed the triage vital signs and the nursing notes.  Pertinent labs & imaging results that were available during my care of the patient were reviewed by me and considered in my medical decision making (see chart for details).  51 year old  female presents with abdominal pain, N/V with frequent presentations for the same. She is tachycardic, tachypneic, and hypertensive. She is afebrile. IM meds ordered since IV access was unable to be obtained initially. CBC is normal. CMP is remarkable for bicarb of 15, hyperglycemia (429), AKI (1.27), elevated anion gap (19). UA is pending. Shared visit with Dr Darl Householder. We were unable to obtain PIV at bedside  with [place]. Dr. Darl Householder will attempt central line. Fluids and insulin were ordered. DKA order set was utilized. Care was signed out to [place] PA-C at shift change. Patient will likely need admission for ongoing management.   Final Clinical Impressions(s) / ED Diagnoses   Final diagnoses:  Diabetic ketoacidosis without coma associated with type 1 diabetes mellitus [facility])    ED Discharge Orders    None       Recardo Evangelist, PA-C 06/15/17 2247    Drenda Freeze, MD 06/17/17 [phone]

## 2017-06-15 NOTE — ED Triage Notes (Signed)
Pt comes to ed via ems, c/o chronic gastroparesis, NV, family 8 units of novo log at 1915. cbg  563.   Pt did take Lanatus this morning.  4 mg IM zofran given.  V/s on arrival sinus tach 108, bp 168/86, spo2 100 room air. rr 20. Alert x4.

## 2017-06-15 NOTE — ED Notes (Signed)
ED Provider at bedside. 

## 2017-06-16 ENCOUNTER — Encounter (HOSPITAL_COMMUNITY): Payer: Self-pay | Admitting: *Deleted

## 2017-06-16 DIAGNOSIS — E785 Hyperlipidemia, unspecified: Secondary | ICD-10-CM | POA: Diagnosis present

## 2017-06-16 DIAGNOSIS — E860 Dehydration: Secondary | ICD-10-CM | POA: Diagnosis present

## 2017-06-16 DIAGNOSIS — Z884 Allergy status to anesthetic agent status: Secondary | ICD-10-CM | POA: Diagnosis not present

## 2017-06-16 DIAGNOSIS — K219 Gastro-esophageal reflux disease without esophagitis: Secondary | ICD-10-CM | POA: Diagnosis present

## 2017-06-16 DIAGNOSIS — Z79899 Other long term (current) drug therapy: Secondary | ICD-10-CM | POA: Diagnosis not present

## 2017-06-16 DIAGNOSIS — Z880 Allergy status to penicillin: Secondary | ICD-10-CM | POA: Diagnosis not present

## 2017-06-16 DIAGNOSIS — Z833 Family history of diabetes mellitus: Secondary | ICD-10-CM | POA: Diagnosis not present

## 2017-06-16 DIAGNOSIS — Z888 Allergy status to other drugs, medicaments and biological substances status: Secondary | ICD-10-CM | POA: Diagnosis not present

## 2017-06-16 DIAGNOSIS — Z794 Long term (current) use of insulin: Secondary | ICD-10-CM | POA: Diagnosis not present

## 2017-06-16 DIAGNOSIS — Z91128 Patient's intentional underdosing of medication regimen for other reason: Secondary | ICD-10-CM | POA: Diagnosis not present

## 2017-06-16 DIAGNOSIS — E0810 Diabetes mellitus due to underlying condition with ketoacidosis without coma: Secondary | ICD-10-CM

## 2017-06-16 DIAGNOSIS — T450X5S Adverse effect of antiallergic and antiemetic drugs, sequela: Secondary | ICD-10-CM | POA: Diagnosis not present

## 2017-06-16 DIAGNOSIS — E1143 Type 2 diabetes mellitus with diabetic autonomic (poly)neuropathy: Secondary | ICD-10-CM | POA: Diagnosis not present

## 2017-06-16 DIAGNOSIS — E1110 Type 2 diabetes mellitus with ketoacidosis without coma: Secondary | ICD-10-CM | POA: Diagnosis present

## 2017-06-16 DIAGNOSIS — K3184 Gastroparesis: Secondary | ICD-10-CM

## 2017-06-16 DIAGNOSIS — T383X6A Underdosing of insulin and oral hypoglycemic [antidiabetic] drugs, initial encounter: Secondary | ICD-10-CM | POA: Diagnosis present

## 2017-06-16 DIAGNOSIS — R112 Nausea with vomiting, unspecified: Secondary | ICD-10-CM | POA: Diagnosis not present

## 2017-06-16 DIAGNOSIS — E1010 Type 1 diabetes mellitus with ketoacidosis without coma: Principal | ICD-10-CM

## 2017-06-16 DIAGNOSIS — F329 Major depressive disorder, single episode, unspecified: Secondary | ICD-10-CM | POA: Diagnosis present

## 2017-06-16 DIAGNOSIS — G2401 Drug induced subacute dyskinesia: Secondary | ICD-10-CM | POA: Diagnosis present

## 2017-06-16 DIAGNOSIS — Z885 Allergy status to narcotic agent status: Secondary | ICD-10-CM | POA: Diagnosis not present

## 2017-06-16 DIAGNOSIS — E1043 Type 1 diabetes mellitus with diabetic autonomic (poly)neuropathy: Secondary | ICD-10-CM | POA: Diagnosis present

## 2017-06-16 DIAGNOSIS — I10 Essential (primary) hypertension: Secondary | ICD-10-CM | POA: Diagnosis present

## 2017-06-16 LAB — GLUCOSE, CAPILLARY
GLUCOSE-CAPILLARY: 245 mg/dL — AB (ref 65–99)
GLUCOSE-CAPILLARY: 154 mg/dL — AB (ref 65–99)
Glucose-Capillary: 134 mg/dL — ABNORMAL HIGH (ref 65–99)
Glucose-Capillary: 108 mg/dL — ABNORMAL HIGH (ref 65–99)
Glucose-Capillary: 102 mg/dL — ABNORMAL HIGH (ref 65–99)
Glucose-Capillary: 101 mg/dL — ABNORMAL HIGH (ref 65–99)
Glucose-Capillary: 113 mg/dL — ABNORMAL HIGH (ref 65–99)
Glucose-Capillary: 138 mg/dL — ABNORMAL HIGH (ref 65–99)
Glucose-Capillary: 157 mg/dL — ABNORMAL HIGH (ref 65–99)
Glucose-Capillary: 361 mg/dL — ABNORMAL HIGH (ref 65–99)
Glucose-Capillary: 248 mg/dL — ABNORMAL HIGH (ref 65–99)

## 2017-06-16 LAB — CBC
HCT: 32.2 % — ABNORMAL LOW (ref 36.0–46.0)
HEMOGLOBIN: 11 g/dL — AB (ref 12.0–15.0)
MCH: 27.4 pg (ref 26.0–34.0)
MCHC: 34.2 g/dL (ref 30.0–36.0)
MCV: 80.1 fL (ref 78.0–100.0)
Platelets: 284 10*3/uL (ref 150–400)
RBC: 4.02 MIL/uL (ref 3.87–5.11)
RDW: 18.6 % — AB (ref 11.5–15.5)
WBC: 9.3 10*3/uL (ref 4.0–10.5)

## 2017-06-16 LAB — BASIC METABOLIC PANEL
ANION GAP: 6 (ref 5–15)
BUN: 17 mg/dL (ref 6–20)
CALCIUM: 8.6 mg/dL — AB (ref 8.9–10.3)
CO2: 25 mmol/L (ref 22–32)
Chloride: 110 mmol/L (ref 101–111)
Creatinine, Ser: 1.81 mg/dL — ABNORMAL HIGH (ref 0.44–1.00)
GFR, EST AFRICAN AMERICAN: 37 mL/min — AB (ref >60)
GFR, EST NON AFRICAN AMERICAN: 32 mL/min — AB (ref >60)
Glucose, Bld: 150 mg/dL — ABNORMAL HIGH (ref 65–99)
Potassium: 3.3 mmol/L — ABNORMAL LOW (ref 3.5–5.1)
Sodium: 141 mmol/L (ref 135–145)

## 2017-06-16 LAB — MAGNESIUM: Magnesium: 1.9 mg/dL (ref 1.7–2.4)

## 2017-06-16 LAB — MRSA PCR SCREENING: MRSA by PCR: NEGATIVE

## 2017-06-16 LAB — CBG MONITORING, ED
GLUCOSE-CAPILLARY: 324 mg/dL — AB (ref 65–99)
Glucose-Capillary: 270 mg/dL — ABNORMAL HIGH (ref 65–99)

## 2017-06-16 LAB — CREATININE, SERUM
CREATININE: 1.87 mg/dL — AB (ref 0.44–1.00)
GFR calc Af Amer: 35 mL/min — ABNORMAL LOW (ref >60)
GFR, EST NON AFRICAN AMERICAN: 30 mL/min — AB (ref >60)

## 2017-06-16 MED ORDER — SODIUM CHLORIDE 0.9 % IV SOLN
INTRAVENOUS | Status: DC
  Administered 2017-06-16: 04:00:00 via INTRAVENOUS

## 2017-06-16 MED ORDER — DEXTROSE-NACL 5-0.45 % IV SOLN
INTRAVENOUS | Status: DC
  Administered 2017-06-16: 1000 mL via INTRAVENOUS

## 2017-06-16 MED ORDER — PANTOPRAZOLE SODIUM 40 MG PO TBEC
40.0000 mg | DELAYED_RELEASE_TABLET | Freq: Two times a day (BID) | ORAL | Status: DC
  Administered 2017-06-16 – 2017-06-17 (×3): 40 mg via ORAL
  Filled 2017-06-16 (×3): qty 1

## 2017-06-16 MED ORDER — SODIUM CHLORIDE 0.9 % IV SOLN: INTRAVENOUS | Status: DC

## 2017-06-16 MED ORDER — BENZTROPINE MESYLATE 0.5 MG PO TABS
1.0000 mg | ORAL_TABLET | Freq: Two times a day (BID) | ORAL | Status: DC
  Administered 2017-06-16 – 2017-06-17 (×3): 1 mg via ORAL
  Filled 2017-06-16 (×3): qty 2

## 2017-06-16 MED ORDER — DEXTROSE 50 % IV SOLN: 25.0000 mL | INTRAVENOUS | Status: DC | PRN

## 2017-06-16 MED ORDER — DEXTROSE-NACL 5-0.45 % IV SOLN
INTRAVENOUS | Status: DC
  Administered 2017-06-16: 04:00:00 via INTRAVENOUS

## 2017-06-16 MED ORDER — ENOXAPARIN SODIUM 40 MG/0.4ML ~~LOC~~ SOLN
40.0000 mg | SUBCUTANEOUS | Status: DC
  Administered 2017-06-16: 40 mg via SUBCUTANEOUS
  Filled 2017-06-16: qty 0.4

## 2017-06-16 MED ORDER — INSULIN ASPART 100 UNIT/ML ~~LOC~~ SOLN
0.0000 [IU] | Freq: Every day | SUBCUTANEOUS | Status: DC
  Administered 2017-06-16: 2 [IU] via SUBCUTANEOUS

## 2017-06-16 MED ORDER — SODIUM CHLORIDE 0.9 % IV SOLN
INTRAVENOUS | Status: DC
  Filled 2017-06-16: qty 1

## 2017-06-16 MED ORDER — FUROSEMIDE 20 MG PO TABS: 20.0000 mg | ORAL_TABLET | Freq: Every day | ORAL | Status: DC | PRN

## 2017-06-16 MED ORDER — ERYTHROMYCIN BASE 250 MG PO TABS
250.0000 mg | ORAL_TABLET | Freq: Three times a day (TID) | ORAL | Status: DC
  Administered 2017-06-16 – 2017-06-17 (×5): 250 mg via ORAL
  Filled 2017-06-16 (×6): qty 1

## 2017-06-16 MED ORDER — SODIUM CHLORIDE 0.9 % IV SOLN
INTRAVENOUS | Status: DC
  Administered 2017-06-16: 1000 mL via INTRAVENOUS
  Administered 2017-06-17: 06:00:00 via INTRAVENOUS

## 2017-06-16 MED ORDER — ONDANSETRON 4 MG PO TBDP: 8.0000 mg | ORAL_TABLET | Freq: Three times a day (TID) | ORAL | Status: DC | PRN

## 2017-06-16 MED ORDER — ENOXAPARIN SODIUM 30 MG/0.3ML ~~LOC~~ SOLN
30.0000 mg | SUBCUTANEOUS | Status: DC
  Administered 2017-06-17: 30 mg via SUBCUTANEOUS
  Filled 2017-06-16: qty 0.3

## 2017-06-16 MED ORDER — INSULIN REGULAR BOLUS VIA INFUSION
0.0000 [IU] | Freq: Three times a day (TID) | INTRAVENOUS | Status: DC
  Filled 2017-06-16: qty 10

## 2017-06-16 MED ORDER — INSULIN ASPART 100 UNIT/ML ~~LOC~~ SOLN
0.0000 [IU] | Freq: Three times a day (TID) | SUBCUTANEOUS | Status: DC
  Administered 2017-06-16: 9 [IU] via SUBCUTANEOUS
  Administered 2017-06-16: 2 [IU] via SUBCUTANEOUS
  Administered 2017-06-17: 3 [IU] via SUBCUTANEOUS
  Administered 2017-06-17: 7 [IU] via SUBCUTANEOUS

## 2017-06-16 MED ORDER — SODIUM BICARBONATE 650 MG PO TABS
650.0000 mg | ORAL_TABLET | Freq: Every day | ORAL | Status: DC
  Administered 2017-06-16 – 2017-06-17 (×2): 650 mg via ORAL
  Filled 2017-06-16 (×2): qty 1

## 2017-06-16 MED ORDER — PREGABALIN 75 MG PO CAPS
150.0000 mg | ORAL_CAPSULE | Freq: Two times a day (BID) | ORAL | Status: DC
  Administered 2017-06-16 – 2017-06-17 (×3): 150 mg via ORAL
  Filled 2017-06-16 (×3): qty 2

## 2017-06-16 MED ORDER — FINASTERIDE 5 MG PO TABS
5.0000 mg | ORAL_TABLET | Freq: Every day | ORAL | Status: DC
  Administered 2017-06-16 – 2017-06-17 (×2): 5 mg via ORAL
  Filled 2017-06-16 (×2): qty 1

## 2017-06-16 MED ORDER — INSULIN DETEMIR 100 UNIT/ML ~~LOC~~ SOLN
8.0000 [IU] | SUBCUTANEOUS | Status: DC
Start: 2017-06-16 — End: 2017-06-17
  Administered 2017-06-16: 8 [IU] via SUBCUTANEOUS
  Filled 2017-06-16 (×2): qty 0.08

## 2017-06-16 MED ORDER — SODIUM CHLORIDE 0.9 % IV BOLUS (SEPSIS)
1000.0000 mL | Freq: Once | INTRAVENOUS | Status: AC
  Administered 2017-06-16: 1000 mL via INTRAVENOUS

## 2017-06-16 MED ORDER — DEXTROSE-NACL 5-0.45 % IV SOLN: INTRAVENOUS | Status: DC

## 2017-06-16 MED ORDER — POTASSIUM CHLORIDE CRYS ER 20 MEQ PO TBCR
60.0000 meq | EXTENDED_RELEASE_TABLET | Freq: Once | ORAL | Status: AC
  Administered 2017-06-16: 60 meq via ORAL
  Filled 2017-06-16: qty 3

## 2017-06-16 MED ORDER — PYRIDOSTIGMINE BROMIDE 60 MG PO TABS
30.0000 mg | ORAL_TABLET | Freq: Three times a day (TID) | ORAL | Status: DC
  Administered 2017-06-16 – 2017-06-17 (×4): 30 mg via ORAL
  Filled 2017-06-16 (×5): qty 0.5

## 2017-06-16 MED ORDER — POLYETHYLENE GLYCOL 3350 17 G PO PACK: 17.0000 g | PACK | Freq: Every day | ORAL | Status: DC | PRN

## 2017-06-16 MED ORDER — CLONAZEPAM 0.5 MG PO TBDP
0.5000 mg | ORAL_TABLET | Freq: Two times a day (BID) | ORAL | Status: DC
  Administered 2017-06-16 – 2017-06-17 (×3): 0.5 mg via ORAL
  Filled 2017-06-16 (×3): qty 1

## 2017-06-16 MED ORDER — BUSPIRONE HCL 10 MG PO TABS
10.0000 mg | ORAL_TABLET | Freq: Three times a day (TID) | ORAL | Status: DC
Start: 2017-06-16 — End: 2017-06-17
  Administered 2017-06-16 – 2017-06-17 (×4): 10 mg via ORAL
  Filled 2017-06-16 (×4): qty 1

## 2017-06-16 NOTE — Progress Notes (Signed)
Inpatient Diabetes Program Recommendations  AACE/ADA: New Consensus Statement on Inpatient Glycemic Control (2015)  Target Ranges:  Prepandial:   less than 140 mg/dL      Peak postprandial:   less than 180 mg/dL (1-2 hours)      Critically ill patients:  140 - 180 mg/dL   Results for [name], [name] (MRN [id number]) as of 06/16/2017 09:35  Ref. Range 06/15/2017 20:00  Glucose Latest Ref Range: 65 - 99 mg/dL 429 (H)    Admit with: DKA  History: Type 1 DM, Gastroparesis, Frequent hospitalizations  Home DM Meds: Levemir 12 units BID       Novolog 0-12 units TID per SSI  Current Insulin Orders: Levemir 8 units daily      Novolog Sensitive Correction Scale/ SSI (0-9 units) TID AC + HS        Note patient admitted with DKA yet again.  Well known to the Inpatient Diabetes Program.  This is patient's 10th admission since January 2018 for DKA/ Gastroparesis issues.  Also had 5 ED visits this past year.  Of note, patient just discharged home on 06/12/17 after admission for DKA.  Note patient transitioning to SQ insulin this AM.  Levemir given at 9am.     --Will follow patient during hospitalization--  Wyn Quaker RN, MSN, CDE Diabetes Coordinator Inpatient Glycemic Control Team Team Pager: [phone] (8a-5p)

## 2017-06-16 NOTE — Progress Notes (Signed)
Patient ID: [name], female   DOB: 27-Aug-1965, 51 y.o.   MRN: [id number] Patient was admitted early this morning for DKA and was placed on insulin drip.  Patient seen and examined at bedside and plan of care discussed with her.  I reviewed patient's medical records and this morning's H&P myself.  I reviewed this morning's labs and current medications as well.  She is hypokalemic and blood sugars have improved.  Anion gap is closed.  Will transition insulin drip to long-acting subcutaneous insulin and turn the insulin drip off once subcu units insulin is given.  Start the patient on diet and advance as tolerated.  Repeat a.m. labs

## 2017-06-16 NOTE — H&P (Signed)
History and Physical    [name] OIZ:[id number] DOB: 1966/01/15 DOA: 06/15/2017  Referring MD/NP/PA: Vanita Panda  PCP: [name], MD   Outpatient Specialists: None   Patient coming from: Home  Chief Complaint: Nausea vomiting  HPI: [name] is a 51 y.o. female with medical history significant of type 1 diabetes and severe gastroparesis who has had multiple admissions and has been in the ER multiple times a month with gastroparesis flare of. Patient also has had multiple DKA's. She goes to [facility] for her gastroparesis. She came to the ER again with intractable nausea vomiting. Patient was found to have blood sugar of about 500 with an 9 gap of 19 and low bicarbonate. She is diagnosed with diabetic ketoacidosis and is being admitted to the hospital for treatment. She is having significant abdominal pain as well as involuntary movements which is chronic. No diarrhea. No hematochezia and no fever or chills  ED Course: Patient has been seen in the ER and given multiple boluses of fluids as well as started on IV insulin. She has received Ativan as well as Zofran  Review of Systems: As per HPI otherwise 10 point review of systems negative.    Past Medical History:  Diagnosis Date  . Depression   . Diabetes mellitus without complication ([place])   . Essential hypertension   . Gastroparesis   . GERD (gastroesophageal reflux disease)   . HLD (hyperlipidemia)     Past Surgical History:  Procedure Laterality Date  . COLONOSCOPY  09/27/2014   at [facility]  . ESOPHAGOGASTRODUODENOSCOPY  09/27/2014   at [facility], Dr Rolan Lipa. biospy neg for celiac, neg for H pylori.   . EYE SURGERY    . gailstones    . IR FLUORO GUIDE CV LINE RIGHT  02/01/2017  . IR FLUORO GUIDE CV LINE RIGHT  03/06/2017  . IR FLUORO GUIDE CV LINE RIGHT  03/25/2017  . IR GENERIC HISTORICAL  01/24/2016   IR FLUORO GUIDE CV LINE RIGHT 01/24/2016 Darrell K Allred, PA-C [facility]  . IR GENERIC  HISTORICAL  01/24/2016   IR US GUIDE VASC ACCESS RIGHT 01/24/2016 Darrell K Allred, PA-C [facility]  . IR US GUIDE VASC ACCESS RIGHT  02/01/2017  . IR US GUIDE VASC ACCESS RIGHT  03/06/2017  . IR US GUIDE VASC ACCESS RIGHT  03/25/2017  . POSTERIOR VITRECTOMY AND MEMBRANE PEEL-LEFT EYE  09/28/2002  . POSTERIOR VITRECTOMY AND MEMBRANE PEEL-RIGHT EYE  03/16/2002  . RETINAL DETACHMENT SURGERY       reports that  has never smoked. she has never used smokeless tobacco. She reports that she does not drink alcohol or use drugs.  Allergies  Allergen Reactions  . Anesthetics, Amide Nausea And Vomiting  . Penicillins Diarrhea and Nausea And Vomiting    Has patient had a PCN reaction causing immediate rash, facial/tongue/throat swelling, SOB or lightheadedness with hypotension: Yes Has patient had a PCN reaction causing severe rash involving mucus membranes or skin necrosis: No Has patient had a PCN reaction that required hospitalization No Has patient had a PCN reaction occurring within the last 10 years: Yes  If all of the above answers are "NO", then may proceed with Cephalosporin use.   . Buprenorphine Hcl Rash  . Encainide Nausea And Vomiting  . Metoclopramide Other (See Comments)    Dystonia, muscle rigidity.     Family History  Problem Relation Age of Onset  . Cystic fibrosis Mother   . Hypertension Father   . Diabetes  Brother   . Hypertension Maternal Grandmother      Prior to Admission medications   Medication Sig Start Date End Date Taking? Authorizing Provider  benztropine (COGENTIN) 1 MG tablet Take 1 tablet (1 mg total) by mouth 2 (two) times daily. 04/03/17  Yes Dessa Phi, DO  busPIRone (BUSPAR) 10 MG tablet Take 1 tablet (10 mg total) by mouth 3 (three) times daily. 05/18/17  Yes Emokpae, Courage, MD  clonazePAM (KLONOPIN) 0.5 MG disintegrating tablet Take 1 tablet (0.5 mg total) by mouth 2 (two) times daily. 04/15/17  Yes Gerlene Fee, NP  erythromycin base (E-MYCIN)  500 MG tablet Take 0.5 tablets (250 mg total) by mouth 3 (three) times daily after meals. 05/18/17 05/18/18 Yes Emokpae, Courage, MD  finasteride (PROSCAR) 5 MG tablet Take 1 tablet (5 mg total) by mouth daily. 04/04/17  Yes Dessa Phi, DO  furosemide (LASIX) 20 MG tablet Take 1 tablet (20 mg total) by mouth daily as needed for fluid or edema (as needed for swelling). 05/18/17  Yes Emokpae, Courage, MD  insulin aspart (NOVOLOG FLEXPEN) 100 UNIT/ML FlexPen 6 Units. Inject as per sliding scale subcutaneously before meals and at bedtime 70 - 119 = 0 units 120 - 150 = 2 units 151 - 180 = 4 units 181 - 210 = 5 units Greater than 400 notify MD 211 - 240 = 6 units 241 - 270 = 8 units 271 - 300 = 9 units 301 - 330 = 10 units 331 - 360 = 11 units 361 - 400 = 12 units    Yes [provider]  Insulin Detemir (LEVEMIR FLEXPEN) 100 UNIT/ML Pen Inject 12 Units into the skin 2 (two) times daily. 05/18/17  Yes Emokpae, Courage, MD  ondansetron (ZOFRAN-ODT) 8 MG disintegrating tablet Take 1 tablet (8 mg total) by mouth every 8 (eight) hours as needed for nausea or vomiting. 06/03/17  Yes Law, Alexandra M, PA-C  pantoprazole (PROTONIX) 40 MG tablet Take 1 tablet (40 mg total) 2 (two) times daily by mouth. 05/09/17  Yes Barton Dubois, MD  polyethylene glycol (MIRALAX / GLYCOLAX) packet Take 17 g by mouth daily as needed for mild constipation.  04/11/17  Yes [provider]  pregabalin (LYRICA) 150 MG capsule Take 150 mg 2 (two) times daily by mouth. 05/05/15  Yes [provider]  pyridostigmine (MESTINON) 60 MG tablet Take 30 mg by mouth 3 (three) times daily.    Yes [provider]  sodium bicarbonate 650 MG tablet Take 1 tablet (650 mg total) daily by mouth. 05/10/17  Yes Barton Dubois, MD    Physical Exam: Vitals:   06/15/17 2012 06/15/17 2030 06/15/17 2339  BP: (!) 154/96 (!) 159/90 111/60  Pulse: (!) 107 (!) 112 100  Resp: (!) 31 (!) 33 15  Temp: 98.5 F  (36.9 C)    TempSrc: Oral    SpO2: 100% 100% 99%      Constitutional: NAD, calm, comfortable Vitals:   06/15/17 2012 06/15/17 2030 06/15/17 2339  BP: (!) 154/96 (!) 159/90 111/60  Pulse: (!) 107 (!) 112 100  Resp: (!) 31 (!) 33 15  Temp: 98.5 F (36.9 C)    TempSrc: Oral    SpO2: 100% 100% 99%   Eyes: PERRL, lids and conjunctivae normal ENMT: Mucous membranes are dry. Posterior pharynx clear of any exudate or lesions.Normal dentition.  Neck: normal, supple, no masses, no thyromegaly Respiratory: clear to auscultation bilaterally, no wheezing, no crackles. Normal respiratory effort. No accessory muscle use.  Cardiovascular: Regular rate and rhythm, no murmurs / rubs / gallops. No extremity edema. 2+ pedal pulses. No carotid bruits.  Abdomen: diffuse tenderness, no masses palpated. No hepatosplenomegaly. Bowel sounds positive.  Musculoskeletal: no clubbing / cyanosis. No joint deformity upper and lower extremities. Good ROM, no contractures. Normal muscle tone.  Skin: no rashes, lesions, ulcers. No induration Neurologic: CN 2-12 grossly intact. Sensation intact, DTR normal. Strength 5/5 in all 4.  Psychiatric: Normal judgment and insight. Alert and oriented x 3. Normal mood.    Labs on Admission: I have personally reviewed following labs and imaging studies  CBC: Recent Labs  Lab 06/10/17 1437 06/11/17 0418 06/15/17 2000  WBC 9.4 10.0 8.9  NEUTROABS 7.3  --  7.4  HGB 12.9 11.2* 12.5  HCT 38.8 33.9* 37.2  MCV 81.0 80.5 79.8  PLT 331 264 132   Basic Metabolic Panel: Recent Labs  Lab 06/10/17 1437 06/11/17 0418 06/11/17 0851 06/12/17 0536 06/15/17 2000  NA 136 140 135 136 136  K 4.3 3.6 4.2 4.1 4.0  CL 102 115* 109 110 102  CO2 14* 21* 19* 20* 15*  GLUCOSE 427* 96 209* 87 429*  BUN 18 18 17 10 16   CREATININE 1.36* 1.16* 1.21* 0.86 1.27*  CALCIUM 9.4 8.8* 8.5* 8.7* 9.4   GFR: Estimated Creatinine Clearance: 45.6 mL/min (A) (by C-G formula based on SCr of 1.27  mg/dL (H)). Liver Function Tests: Recent Labs  Lab 06/10/17 1437 06/11/17 0418 06/15/17 2000  AST 22 14* 22  ALT 14 10* 18  ALKPHOS 86 68 80  BILITOT 2.1* 1.0 2.1*  PROT 7.7 6.5 7.3  ALBUMIN 3.9 3.5 3.8   Recent Labs  Lab 06/10/17 1437  LIPASE 22   No results for input(s): AMMONIA in the last 168 hours. Coagulation Profile: No results for input(s): INR, PROTIME in the last 168 hours. Cardiac Enzymes: No results for input(s): CKTOTAL, CKMB, CKMBINDEX, TROPONINI in the last 168 hours. BNP (last 3 results) No results for input(s): PROBNP in the last 8760 hours. HbA1C: No results for input(s): HGBA1C in the last 72 hours. CBG: Recent Labs  Lab 06/11/17 1826 06/11/17 2349 06/12/17 0542 06/12/17 1236 06/15/17 2354  GLUCAP 277* 168* 91 139* 338*   Lipid Profile: No results for input(s): CHOL, HDL, LDLCALC, TRIG, CHOLHDL, LDLDIRECT in the last 72 hours. Thyroid Function Tests: No results for input(s): TSH, T4TOTAL, FREET4, T3FREE, THYROIDAB in the last 72 hours. Anemia Panel: No results for input(s): VITAMINB12, FOLATE, FERRITIN, TIBC, IRON, RETICCTPCT in the last 72 hours. Urine analysis:    Component Value Date/Time   COLORURINE YELLOW 06/10/2017 1628   APPEARANCEUR CLEAR 06/10/2017 1628   LABSPEC 1.023 06/10/2017 1628   PHURINE 5.0 06/10/2017 1628   GLUCOSEU >=500 (A) 06/10/2017 1628   HGBUR NEGATIVE 06/10/2017 1628   BILIRUBINUR NEGATIVE 06/10/2017 1628   BILIRUBINUR neg 01/17/2017 1533   KETONESUR 80 (A) 06/10/2017 1628   PROTEINUR 30 (A) 06/10/2017 1628   UROBILINOGEN 0.2 01/17/2017 1533   UROBILINOGEN 0.2 04/27/2015 1608   NITRITE NEGATIVE 06/10/2017 1628   LEUKOCYTESUR NEGATIVE 06/10/2017 1628   Sepsis Labs: @LABRCNTIP (procalcitonin:4,lacticidven:4) )No results found for this or any previous visit (from the past 240 hour(s)).   Radiological Exams on Admission: Dg Chest Portable 1 View  Result Date: 06/15/2017 CLINICAL DATA:  Central line  placement. EXAM: PORTABLE CHEST 1 VIEW COMPARISON:  Chest radiograph performed 06/11/2017 FINDINGS: A right IJ line is noted ending about the mid SVC. The lungs are well-aerated and clear.  There is no evidence of focal opacification, pleural effusion or pneumothorax. The cardiomediastinal silhouette is within normal limits. No acute osseous abnormalities are seen. IMPRESSION: 1. Right IJ line noted ending about the mid SVC. 2. No acute cardiopulmonary process seen. Electronically Signed   By: Garald Balding M.D.   On: 06/15/2017 23:57    EKG: Independently reviewed.   Assessment/Plan Principal Problem:   DKA, type 1 ([place]) Active Problems:   Diabetic gastroparesis ([place])   Essential hypertension   DKA (diabetic ketoacidoses) ([place])   #1 diabetic ketoacidosis: Patient will be admitted to the intensive care unit on IV insulin DKA Gold protocol. Serial BMPs and CBGs onto sugar is fully controlled. We will monitor closely. Goal is for a anion gap to close then patient will be transitioned to her subcutaneous insulin  #2 diabetic gastroparesis: Again this is chronic and patient has received multiple medications from [facility] including injections. Continue Mestinon and other medications with supportive measures  #3 hypertension: Continue home regimen of blood pressure medications.  #4 dehydration: Continue to hydrate patient aggressively.   DVT prophylaxis: Lovenox   Code Status: Full   Family Communication: Patient. She makes her own decisions. No family around   Disposition Plan: Home   Consults called: None  Admission status: Inpatient   Severity of Illness: The appropriate patient status for this patient is INPATIENT. Inpatient status is judged to be reasonable and necessary in order to provide the required intensity of service to ensure the patient's safety. The patient's presenting symptoms, physical exam findings, and initial radiographic and laboratory data in the context of their  chronic comorbidities is felt to place them at high risk for further clinical deterioration. Furthermore, it is not anticipated that the patient will be medically stable for discharge from the hospital within 2 midnights of admission. The following factors support the patient status of inpatient.   " The patient's presenting symptoms include intractable nausea with vomiting. " The worrisome physical exam findings include dehydration and involuntary movement. " The initial radiographic and laboratory data are worrisome because of high anion gap. Also metabolic acidosis " The chronic co-morbidities include poor diabetic control.   * I certify that at the point of admission it is my clinical judgment that the patient will require inpatient hospital care spanning beyond 2 midnights from the point of admission due to high intensity of service, high risk for further deterioration and high frequency of surveillance required.Barbette Merino MD [facility] Pager [phone]  If 7PM-7AM, please contact night-coverage [email or web address] Password [facility]  06/16/2017, 12:54 AM

## 2017-06-16 NOTE — Progress Notes (Signed)
Bmet results paged to MD awaiting orders, Day,RN made aware.

## 2017-06-17 ENCOUNTER — Other Ambulatory Visit: Payer: Self-pay

## 2017-06-17 ENCOUNTER — Encounter (HOSPITAL_COMMUNITY): Payer: Self-pay

## 2017-06-17 LAB — URINALYSIS, ROUTINE W REFLEX MICROSCOPIC
BILIRUBIN URINE: NEGATIVE
Glucose, UA: >=500 mg/dL — AB
KETONES UR: 5 mg/dL — AB
Nitrite: NEGATIVE
PROTEIN: NEGATIVE mg/dL
Specific Gravity, Urine: 1.009 (ref 1.005–1.030)
pH: 6 (ref 5.0–8.0)

## 2017-06-17 LAB — CBC WITH DIFFERENTIAL/PLATELET
BASOS PCT: 1 %
Basophils Absolute: 0 10*3/uL (ref 0.0–0.1)
EOS ABS: 0.3 10*3/uL (ref 0.0–0.7)
Eosinophils Relative: 4 %
HCT: 29.4 % — ABNORMAL LOW (ref 36.0–46.0)
HEMOGLOBIN: 9.6 g/dL — AB (ref 12.0–15.0)
Lymphocytes Relative: 32 %
Lymphs Abs: 2 10*3/uL (ref 0.7–4.0)
MCH: 26.7 pg (ref 26.0–34.0)
MCHC: 32.7 g/dL (ref 30.0–36.0)
MCV: 81.9 fL (ref 78.0–100.0)
Monocytes Absolute: 0.6 10*3/uL (ref 0.1–1.0)
Monocytes Relative: 10 %
NEUTROS PCT: 53 %
Neutro Abs: 3.4 10*3/uL (ref 1.7–7.7)
Platelets: 236 10*3/uL (ref 150–400)
RBC: 3.59 MIL/uL — AB (ref 3.87–5.11)
RDW: 19.2 % — ABNORMAL HIGH (ref 11.5–15.5)
WBC: 6.2 10*3/uL (ref 4.0–10.5)

## 2017-06-17 LAB — BASIC METABOLIC PANEL
ANION GAP: 3 — AB (ref 5–15)
BUN: 15 mg/dL (ref 6–20)
CO2: 22 mmol/L (ref 22–32)
Calcium: 8 mg/dL — ABNORMAL LOW (ref 8.9–10.3)
Chloride: 109 mmol/L (ref 101–111)
Creatinine, Ser: 1.2 mg/dL — ABNORMAL HIGH (ref 0.44–1.00)
GFR calc Af Amer: >60 mL/min (ref >60)
GFR, EST NON AFRICAN AMERICAN: 52 mL/min — AB (ref >60)
GLUCOSE: 326 mg/dL — AB (ref 65–99)
POTASSIUM: 4.6 mmol/L (ref 3.5–5.1)
SODIUM: 134 mmol/L — AB (ref 135–145)

## 2017-06-17 LAB — GLUCOSE, CAPILLARY
GLUCOSE-CAPILLARY: 348 mg/dL — AB (ref 65–99)
GLUCOSE-CAPILLARY: 215 mg/dL — AB (ref 65–99)

## 2017-06-17 LAB — HEMOGLOBIN A1C
HEMOGLOBIN A1C: 9.1 % — AB (ref 4.8–5.6)
Mean Plasma Glucose: 214.47 mg/dL

## 2017-06-17 MED ORDER — INSULIN DETEMIR 100 UNIT/ML ~~LOC~~ SOLN
12.0000 [IU] | SUBCUTANEOUS | Status: DC
  Administered 2017-06-17: 12 [IU] via SUBCUTANEOUS
  Filled 2017-06-17: qty 0.12

## 2017-06-17 MED ORDER — PREGABALIN 150 MG PO CAPS: 150.0000 mg | ORAL_CAPSULE | Freq: Every day | ORAL | Status: DC

## 2017-06-17 NOTE — Care Management Note (Signed)
Case Management Note  Patient Details  Name: [name] MRN: [id number] Date of Birth: Nov 23, 1965  Subjective/Objective:                  dka  Action/Plan: Date: June 17, 2017 Velva Harman, BSN, [place], [place] Chart and notes review for patient progress and needs. Will follow for case management and discharge needs. Next review date: [id number]  Expected Discharge Date:  06/17/17               Expected Discharge Plan:     In-House Referral:     Discharge planning Services     Post Acute Care Choice:    Choice offered to:     DME Arranged:    DME Agency:     HH Arranged:    HH Agency:     Status of Service:     If discussed at [employer], dates discussed:    Additional Comments:  Leeroy Cha, RN 06/17/2017, 8:58 AM

## 2017-06-17 NOTE — Discharge Summary (Signed)
Physician Discharge Summary  [name] INO:[id number] DOB: [birth year] DOA: 06/15/2017  PCP: [name], MD  Admit date: 06/15/2017 Discharge date: 06/17/2017  Admitted From: Home Disposition: Home  Recommendations for Outpatient Follow-up:  1. Follow up with PCP in 1 week with repeat CBC/BMP 2. Follow-up with endocrinology at earliest convenience 3. Patient will benefit from outpatient evaluation by gastroenterology for gastroparesis   Home Health: No Equipment/Devices: None   discharge Condition: Stable CODE STATUS: Full Diet recommendation: Heart Healthy / Carb Modified   Brief/Interim Summary: 51 year old female with history of diabetes mellitus type 1 with severe gastroparesis and multiple recurrent recent admissions for DKA presented with nausea vomiting.  She was found to be in DKA and was started on insulin drip.  Her anion gap closed and she was switched to long-acting subcutaneous insulin.  She is tolerating diet.  She will be discharged home today with outpatient follow-up with endocrinology and she will benefit from evaluation by GI as well.  Discharge Diagnoses:  Principal Problem:   DKA, type 1 ([place]) Active Problems:   Diabetic gastroparesis ([place])   Essential hypertension   DKA (diabetic ketoacidoses) ([place])    Diabetic ketoacidosis:  -Initially started on insulin drip.  Switched to long-acting subcutaneous insulin after anion gap was closed.  Currently tolerating diet. -  She will be discharged home today with outpatient follow-up with endocrinology and she will benefit from evaluation by GI as well. -We will discontinue home sodium bicarbonate tablet  Diabetic gastroparesis: Again this is chronic and patient has received multiple medications from [facility] including injections. Continue Mestinon and other medications with supportive measures -Outpatient follow-up.  Patient will benefit from GI evaluation  Hypertension:  Stable.  Outpatient  follow-up  Dehydration:  Improved.  Tolerating diet     Discharge Instructions  Discharge Instructions    Ambulatory referral to Endocrinology   Complete by:  As directed    Recurrent admissions for DKA   Ambulatory referral to Gastroenterology   Complete by:  As directed    Severe gastroparesis   What is the reason for referral?:  Other   Call MD for:  difficulty breathing, headache or visual disturbances   Complete by:  As directed    Call MD for:  extreme fatigue   Complete by:  As directed    Call MD for:  hives   Complete by:  As directed    Call MD for:  persistant dizziness or light-headedness   Complete by:  As directed    Call MD for:  persistant nausea and vomiting   Complete by:  As directed    Call MD for:  severe uncontrolled pain   Complete by:  As directed    Call MD for:  temperature >100.4   Complete by:  As directed    Diet - low sodium heart healthy   Complete by:  As directed    Diet Carb Modified   Complete by:  As directed    Increase activity slowly   Complete by:  As directed      Allergies as of 06/17/2017      Reactions   Anesthetics, Amide Nausea And Vomiting   Penicillins Diarrhea, Nausea And Vomiting   Has patient had a PCN reaction causing immediate rash, facial/tongue/throat swelling, SOB or lightheadedness with hypotension: Yes Has patient had a PCN reaction causing severe rash involving mucus membranes or skin necrosis: No Has patient had a PCN reaction that required hospitalization No Has patient had a PCN reaction  occurring within the last 10 years: Yes  If all of the above answers are "NO", then may proceed with Cephalosporin use.   Buprenorphine Hcl Rash   Encainide Nausea And Vomiting   Metoclopramide Other (See Comments)   Dystonia, muscle rigidity.       Medication List    STOP taking these medications   sodium bicarbonate 650 MG tablet     TAKE these medications   benztropine 1 MG tablet Commonly known as:   COGENTIN Take 1 tablet (1 mg total) by mouth 2 (two) times daily.   busPIRone 10 MG tablet Commonly known as:  BUSPAR Take 1 tablet (10 mg total) by mouth 3 (three) times daily.   clonazePAM 0.5 MG disintegrating tablet Commonly known as:  KLONOPIN Take 1 tablet (0.5 mg total) by mouth 2 (two) times daily.   erythromycin base 500 MG tablet Commonly known as:  E-MYCIN Take 0.5 tablets (250 mg total) by mouth 3 (three) times daily after meals.   finasteride 5 MG tablet Commonly known as:  PROSCAR Take 1 tablet (5 mg total) by mouth daily.   furosemide 20 MG tablet Commonly known as:  LASIX Take 1 tablet (20 mg total) by mouth daily as needed for fluid or edema (as needed for swelling).   Insulin Detemir 100 UNIT/ML Pen Commonly known as:  LEVEMIR FLEXPEN Inject 12 Units into the skin 2 (two) times daily.   NOVOLOG FLEXPEN 100 UNIT/ML FlexPen Generic drug:  insulin aspart 6 Units. Inject as per sliding scale subcutaneously before meals and at bedtime 70 - 119 = 0 units 120 - 150 = 2 units 151 - 180 = 4 units 181 - 210 = 5 units Greater than 400 notify MD 211 - 240 = 6 units 241 - 270 = 8 units 271 - 300 = 9 units 301 - 330 = 10 units 331 - 360 = 11 units 361 - 400 = 12 units   ondansetron 8 MG disintegrating tablet Commonly known as:  ZOFRAN-ODT Take 1 tablet (8 mg total) by mouth every 8 (eight) hours as needed for nausea or vomiting.   pantoprazole 40 MG tablet Commonly known as:  PROTONIX Take 1 tablet (40 mg total) 2 (two) times daily by mouth.   polyethylene glycol packet Commonly known as:  MIRALAX / GLYCOLAX Take 17 g by mouth daily as needed for mild constipation.   pregabalin 150 MG capsule Commonly known as:  LYRICA Take 1 capsule (150 mg total) by mouth daily. What changed:  when to take this   pyridostigmine 60 MG tablet Commonly known as:  MESTINON Take 30 mg by mouth 3 (three) times daily.      Follow-up Information    [name], MD.  Schedule an appointment as soon as possible for a visit in 1 week(s).   Specialty:  Family Medicine Why:  with repeat CBC/BMP Contact information: [address] [place] [address] [phone]          Allergies  Allergen Reactions  . Anesthetics, Amide Nausea And Vomiting  . Penicillins Diarrhea and Nausea And Vomiting    Has patient had a PCN reaction causing immediate rash, facial/tongue/throat swelling, SOB or lightheadedness with hypotension: Yes Has patient had a PCN reaction causing severe rash involving mucus membranes or skin necrosis: No Has patient had a PCN reaction that required hospitalization No Has patient had a PCN reaction occurring within the last 10 years: Yes  If all of the above answers are "NO",  then may proceed with Cephalosporin use.   . Buprenorphine Hcl Rash  . Encainide Nausea And Vomiting  . Metoclopramide Other (See Comments)    Dystonia, muscle rigidity.     Consultations:  None   Procedures/Studies: Dg Chest Portable 1 View  Result Date: 06/15/2017 CLINICAL DATA:  Central line placement. EXAM: PORTABLE CHEST 1 VIEW COMPARISON:  Chest radiograph performed 06/11/2017 FINDINGS: A right IJ line is noted ending about the mid SVC. The lungs are well-aerated and clear. There is no evidence of focal opacification, pleural effusion or pneumothorax. The cardiomediastinal silhouette is within normal limits. No acute osseous abnormalities are seen. IMPRESSION: 1. Right IJ line noted ending about the mid SVC. 2. No acute cardiopulmonary process seen. Electronically Signed   By: Garald Balding M.D.   On: 06/15/2017 23:57   Dg Chest Port 1 View  Result Date: 06/11/2017 CLINICAL DATA:  Nausea and vomiting for 2 days. EXAM: PORTABLE CHEST 1 VIEW COMPARISON:  Radiograph 03/25/2017 FINDINGS: Patient's chain obscures both apices. The cardiomediastinal contours are normal. The lungs are clear. Pulmonary vasculature is normal. No consolidation, pleural  effusion, or pneumothorax. No acute osseous abnormalities are seen. IMPRESSION: No acute abnormality. Electronically Signed   By: Jeb Levering M.D.   On: 06/11/2017 03:55   Dg Abd Portable 2v  Result Date: 06/11/2017 CLINICAL DATA:  Nausea and vomiting for 2 days. EXAM: PORTABLE ABDOMEN - 2 VIEW COMPARISON:  Radiographs 03/30/2017 FINDINGS: No bowel dilatation to suggest obstruction. No evidence of free air, overlying monitoring devices partially limit evaluation. Moderate to large colonic stool burden. No abnormal rectal distention. Cholecystectomy clips in the right upper quadrant. No abnormal soft tissue calcifications. IMPRESSION: Moderate to large colonic stool burden suggesting constipation. No bowel obstruction or evidence of free air. Electronically Signed   By: Jeb Levering M.D.   On: 06/11/2017 03:54     Subjective: Patient seen and examined at bedside.  She feels better and is tolerating diet.  No overnight fever or vomiting.  Discharge Exam: Vitals:   06/17/17 0605 06/17/17 0800  BP: (!) 112/49   Pulse: 78   Resp: 20   Temp:  98.4 F (36.9 C)  SpO2: 99%    Vitals:   06/17/17 0500 06/17/17 0600 06/17/17 0605 06/17/17 0800  BP: (!) 101/44 (!) 53/16 (!) 112/49   Pulse: 74 81 78   Resp: 16 (!) 24 20   Temp:    98.4 F (36.9 C)  TempSrc:    Oral  SpO2: 99% 100% 99%   Weight:      Height:        General: Pt is alert, awake, not in acute distress Cardiovascular: Rate controlled, S1/S2 + Respiratory: Bilateral decreased breath sounds at bases Abdominal: Soft, NT, ND, bowel sounds + Extremities: no edema, no cyanosis    The results of significant diagnostics from this hospitalization (including imaging, microbiology, ancillary and laboratory) are listed below for reference.     Microbiology: Recent Results (from the past 240 hour(s))  MRSA PCR Screening     Status: None   Collection Time: 06/16/17  3:20 AM  Result Value Ref Range Status   MRSA by PCR  NEGATIVE NEGATIVE Final    Comment:        The GeneXpert MRSA Assay (FDA approved for NASAL specimens only), is one component of a comprehensive MRSA colonization surveillance program. It is not intended to diagnose MRSA infection nor to guide or monitor treatment for MRSA infections.  Labs: BNP (last 3 results) No results for input(s): BNP in the last 8760 hours. Basic Metabolic Panel: Recent Labs  Lab 06/11/17 0851 06/12/17 0536 06/15/17 2000 06/16/17 0400 06/16/17 0426 06/17/17 0500  NA 135 136 136  --  141 134*  K 4.2 4.1 4.0  --  3.3* 4.6  CL 109 110 102  --  110 109  CO2 19* 20* 15*  --  25 22  GLUCOSE 209* 87 429*  --  150* 326*  BUN 17 10 16   --  17 15  CREATININE 1.21* 0.86 1.27* 1.87* 1.81* 1.20*  CALCIUM 8.5* 8.7* 9.4  --  8.6* 8.0*  MG  --   --   --  1.9  --   --    Liver Function Tests: Recent Labs  Lab 06/10/17 1437 06/11/17 0418 06/15/17 2000  AST 22 14* 22  ALT 14 10* 18  ALKPHOS 86 68 80  BILITOT 2.1* 1.0 2.1*  PROT 7.7 6.5 7.3  ALBUMIN 3.9 3.5 3.8   Recent Labs  Lab 06/10/17 1437  LIPASE 22   No results for input(s): AMMONIA in the last 168 hours. CBC: Recent Labs  Lab 06/10/17 1437 06/11/17 0418 06/15/17 2000 06/16/17 0400 06/17/17 0500  WBC 9.4 10.0 8.9 9.3 6.2  NEUTROABS 7.3  --  7.4  --  3.4  HGB 12.9 11.2* 12.5 11.0* 9.6*  HCT 38.8 33.9* 37.2 32.2* 29.4*  MCV 81.0 80.5 79.8 80.1 81.9  PLT 331 264 320 284 236   Cardiac Enzymes: No results for input(s): CKTOTAL, CKMB, CKMBINDEX, TROPONINI in the last 168 hours. BNP: Invalid input(s): POCBNP CBG: Recent Labs  Lab 06/16/17 1036 06/16/17 1124 06/16/17 1638 06/16/17 2114 06/17/17 0744  GLUCAP 138* 157* 361* 248* 348*   D-Dimer No results for input(s): DDIMER in the last 72 hours. Hgb A1c Recent Labs    06/17/17 0500  HGBA1C 9.1*   Lipid Profile No results for input(s): CHOL, HDL, LDLCALC, TRIG, CHOLHDL, LDLDIRECT in the last 72 hours. Thyroid function  studies No results for input(s): TSH, T4TOTAL, T3FREE, THYROIDAB in the last 72 hours.  Invalid input(s): FREET3 Anemia work up No results for input(s): VITAMINB12, FOLATE, FERRITIN, TIBC, IRON, RETICCTPCT in the last 72 hours. Urinalysis    Component Value Date/Time   COLORURINE YELLOW 06/17/2017 0543   APPEARANCEUR HAZY (A) 06/17/2017 0543   LABSPEC 1.009 06/17/2017 0543   PHURINE 6.0 06/17/2017 0543   GLUCOSEU >=500 (A) 06/17/2017 0543   HGBUR LARGE (A) 06/17/2017 0543   BILIRUBINUR NEGATIVE 06/17/2017 0543   BILIRUBINUR neg 01/17/2017 1533   KETONESUR 5 (A) 06/17/2017 0543   PROTEINUR NEGATIVE 06/17/2017 0543   UROBILINOGEN 0.2 01/17/2017 1533   UROBILINOGEN 0.2 04/27/2015 1608   NITRITE NEGATIVE 06/17/2017 0543   LEUKOCYTESUR TRACE (A) 06/17/2017 0543   Sepsis Labs Invalid input(s): PROCALCITONIN,  WBC,  LACTICIDVEN Microbiology Recent Results (from the past 240 hour(s))  MRSA PCR Screening     Status: None   Collection Time: 06/16/17  3:20 AM  Result Value Ref Range Status   MRSA by PCR NEGATIVE NEGATIVE Final    Comment:        The GeneXpert MRSA Assay (FDA approved for NASAL specimens only), is one component of a comprehensive MRSA colonization surveillance program. It is not intended to diagnose MRSA infection nor to guide or monitor treatment for MRSA infections.      Time coordinating discharge: 35 minutes  SIGNED:   Aline August, MD  [facility]  06/17/2017, 8:32 AM Pager: [phone]  If 7PM-7AM, please contact night-coverage [email or web address] Password [email or web address]

## 2017-06-17 NOTE — Progress Notes (Signed)
[name] [age] d/c home with her sister, d/c ins. Given. We went over her meds. No change in her condition.

## 2017-06-17 NOTE — Progress Notes (Signed)
Date: June 17, 2017 [name], BSN, [place], [place] Chart and notes review for patient progress and needs. Patient has been given names and numbers of endocrine md in the past as well as the [facility] and wellness clinic, but she noncompliant.  No home health order noted in the discharge orders. Will follow for case management and discharge needs. Next review date: [id number]

## 2017-06-21 ENCOUNTER — Telehealth: Payer: Self-pay | Admitting: Family Medicine

## 2017-06-21 NOTE — Telephone Encounter (Signed)
[facility] Post-discharge Follow-Up Phone Call:  Date of Discharge: 06/17/17 Principal Discharge Diagnosis(es):  Type 1  DKA, diabetic  Post-discharge Communication: (Clearly document all attempts clearly and date contact made) call placed to the patient.  Call Completed: Yes                    With Whom: patient Interpreter Needed: No     Please check all that apply:  X Patient is knowledgeable of his/her condition(s) and/or treatment. ? Patient is caring for self at home.  X  Patient is receiving assist at home from family and/or caregiver. Family and/or caregiver is knowledgeable of patients condition(s) and/or treatment. - patient lives with her sister. Patient mentioned that sister works 10-12 hour shifts and therefore helps patient when she can.   ? Patient is receiving home health services. If so, name of agency.     Medication Reconciliation:  ? Medication list reviewed with patient. X  Patient obtained all discharge medications. If not, why? Patient stated that she is taking all her medication but that she is no longer taking reglan because it was switched to erythromycin. Patient stated that she will soon run out of that medication but will contact pharmacy soon. Patient confirmed that she uses both [facility] and [facility] depending on her transportation.    Activities of Daily Living:  X  Independent ? Needs assist (describe; ? home DME used) ? [facility] (describe, ? home DME used)   Community resources in place for patient:  X None  ? Home Health/Home DME ? Assisted Living ? Support Group  Scheduled patient an appointment for Tuesday 07/03/2016 at 2pm. During our conversation patient stated that she is feeling "ok" and that each day is different. Patient also said that she is tired of being sick and having gastroparesis and hopes that things will get better for her. Patient confirmed that she is still using Medicaid transportation  when needed. Regarding to her appointment with specialist, Dr. Addison Naegeli, patient said that she missed that appointment because she was sick but will call the office when she is ready. Informed patient that I would inform Opal Sidles (Tourist information centre manager) and provider of our call. Patient understood and had no further questions.

## 2017-06-23 ENCOUNTER — Inpatient Hospital Stay (HOSPITAL_COMMUNITY)
Admission: EM | Admit: 2017-06-23 | Discharge: 2017-06-25 | DRG: 639 | Disposition: A | Discharge status: Home / Self Care | Payer: Medicaid Other | Attending: Family Medicine | Admitting: Family Medicine

## 2017-06-23 ENCOUNTER — Other Ambulatory Visit: Payer: Self-pay

## 2017-06-23 ENCOUNTER — Encounter (HOSPITAL_COMMUNITY): Payer: Self-pay | Admitting: *Deleted

## 2017-06-23 DIAGNOSIS — Z884 Allergy status to anesthetic agent status: Secondary | ICD-10-CM

## 2017-06-23 DIAGNOSIS — K3184 Gastroparesis: Secondary | ICD-10-CM

## 2017-06-23 DIAGNOSIS — I10 Essential (primary) hypertension: Secondary | ICD-10-CM | POA: Diagnosis present

## 2017-06-23 DIAGNOSIS — K219 Gastro-esophageal reflux disease without esophagitis: Secondary | ICD-10-CM | POA: Diagnosis present

## 2017-06-23 DIAGNOSIS — Z880 Allergy status to penicillin: Secondary | ICD-10-CM

## 2017-06-23 DIAGNOSIS — E785 Hyperlipidemia, unspecified: Secondary | ICD-10-CM | POA: Diagnosis present

## 2017-06-23 DIAGNOSIS — Z794 Long term (current) use of insulin: Secondary | ICD-10-CM

## 2017-06-23 DIAGNOSIS — E1143 Type 2 diabetes mellitus with diabetic autonomic (poly)neuropathy: Secondary | ICD-10-CM | POA: Diagnosis present

## 2017-06-23 DIAGNOSIS — Z79899 Other long term (current) drug therapy: Secondary | ICD-10-CM

## 2017-06-23 DIAGNOSIS — Z91128 Patient's intentional underdosing of medication regimen for other reason: Secondary | ICD-10-CM

## 2017-06-23 DIAGNOSIS — E1010 Type 1 diabetes mellitus with ketoacidosis without coma: Secondary | ICD-10-CM

## 2017-06-23 DIAGNOSIS — T383X6A Underdosing of insulin and oral hypoglycemic [antidiabetic] drugs, initial encounter: Secondary | ICD-10-CM | POA: Diagnosis present

## 2017-06-23 DIAGNOSIS — E1110 Type 2 diabetes mellitus with ketoacidosis without coma: Principal | ICD-10-CM

## 2017-06-23 DIAGNOSIS — Z888 Allergy status to other drugs, medicaments and biological substances status: Secondary | ICD-10-CM

## 2017-06-23 DIAGNOSIS — F329 Major depressive disorder, single episode, unspecified: Secondary | ICD-10-CM | POA: Diagnosis present

## 2017-06-23 LAB — URINALYSIS, ROUTINE W REFLEX MICROSCOPIC
BACTERIA UA: NONE SEEN
Bilirubin Urine: NEGATIVE
HGB URINE DIPSTICK: NEGATIVE
Ketones, ur: 80 mg/dL — AB
LEUKOCYTES UA: NEGATIVE
NITRITE: NEGATIVE
PROTEIN: 100 mg/dL — AB
Specific Gravity, Urine: 1.024 (ref 1.005–1.030)
pH: 5 (ref 5.0–8.0)

## 2017-06-23 LAB — I-STAT CHEM 8, ED
BUN: 18 mg/dL (ref 6–20)
Calcium, Ion: 1.08 mmol/L — ABNORMAL LOW (ref 1.15–1.40)
Chloride: 103 mmol/L (ref 101–111)
Creatinine, Ser: 1 mg/dL (ref 0.44–1.00)
Glucose, Bld: 404 mg/dL — ABNORMAL HIGH (ref 65–99)
HEMATOCRIT: 47 % — AB (ref 36.0–46.0)
Hemoglobin: 16 g/dL — ABNORMAL HIGH (ref 12.0–15.0)
POTASSIUM: 4.4 mmol/L (ref 3.5–5.1)
Sodium: 138 mmol/L (ref 135–145)
TCO2: 20 mmol/L — ABNORMAL LOW (ref 22–32)

## 2017-06-23 LAB — COMPREHENSIVE METABOLIC PANEL
ALT: 13 U/L — ABNORMAL LOW (ref 14–54)
ANION GAP: 17 — AB (ref 5–15)
AST: 20 U/L (ref 15–41)
Albumin: 3.7 g/dL (ref 3.5–5.0)
Alkaline Phosphatase: 72 U/L (ref 38–126)
BILIRUBIN TOTAL: 1.8 mg/dL — AB (ref 0.3–1.2)
BUN: 19 mg/dL (ref 6–20)
CO2: 17 mmol/L — ABNORMAL LOW (ref 22–32)
Calcium: 9.1 mg/dL (ref 8.9–10.3)
Chloride: 102 mmol/L (ref 101–111)
Creatinine, Ser: 1.21 mg/dL — ABNORMAL HIGH (ref 0.44–1.00)
GFR, EST AFRICAN AMERICAN: 59 mL/min — AB (ref >60)
GFR, EST NON AFRICAN AMERICAN: 51 mL/min — AB (ref >60)
Glucose, Bld: 398 mg/dL — ABNORMAL HIGH (ref 65–99)
POTASSIUM: 4.4 mmol/L (ref 3.5–5.1)
Sodium: 136 mmol/L (ref 135–145)
TOTAL PROTEIN: 7.3 g/dL (ref 6.5–8.1)

## 2017-06-23 LAB — CBC
HEMATOCRIT: 37.4 % (ref 36.0–46.0)
HEMOGLOBIN: 12.3 g/dL (ref 12.0–15.0)
MCH: 27 pg (ref 26.0–34.0)
MCHC: 32.9 g/dL (ref 30.0–36.0)
MCV: 82 fL (ref 78.0–100.0)
Platelets: 382 10*3/uL (ref 150–400)
RBC: 4.56 MIL/uL (ref 3.87–5.11)
RDW: 17.5 % — ABNORMAL HIGH (ref 11.5–15.5)
WBC: 8.4 10*3/uL (ref 4.0–10.5)

## 2017-06-23 LAB — I-STAT BETA HCG BLOOD, ED (MC, WL, AP ONLY): I-stat hCG, quantitative: <5 m[IU]/mL (ref <5)

## 2017-06-23 LAB — LIPASE, BLOOD: Lipase: 18 U/L (ref 11–51)

## 2017-06-23 LAB — CBG MONITORING, ED: Glucose-Capillary: 377 mg/dL — ABNORMAL HIGH (ref 65–99)

## 2017-06-23 MED ORDER — SODIUM CHLORIDE 0.9 % IV BOLUS (SEPSIS)
1000.0000 mL | Freq: Once | INTRAVENOUS | Status: AC
  Administered 2017-06-23: 1000 mL via INTRAVENOUS

## 2017-06-23 MED ORDER — ONDANSETRON 4 MG PO TBDP
4.0000 mg | ORAL_TABLET | Freq: Once | ORAL | Status: AC
  Administered 2017-06-23: 4 mg via ORAL
  Filled 2017-06-23: qty 1

## 2017-06-23 MED ORDER — LORAZEPAM 2 MG/ML IJ SOLN
1.0000 mg | Freq: Once | INTRAMUSCULAR | Status: AC
  Administered 2017-06-23: 1 mg via INTRAMUSCULAR
  Filled 2017-06-23: qty 1

## 2017-06-23 MED ORDER — SODIUM CHLORIDE 0.9 % IV SOLN
INTRAVENOUS | Status: DC
  Administered 2017-06-23: 3.2 [IU]/h via INTRAVENOUS
  Administered 2017-06-24: 2.8 [IU]/h via INTRAVENOUS
  Administered 2017-06-24: 1.8 [IU]/h via INTRAVENOUS
  Filled 2017-06-23: qty 1

## 2017-06-23 MED ORDER — DEXTROSE-NACL 5-0.45 % IV SOLN: INTRAVENOUS | Status: DC

## 2017-06-23 NOTE — ED Provider Notes (Signed)
[place] DEPT Provider Note   CSN: [id number] Arrival date & time: 06/23/17  1630     History   Chief Complaint Chief Complaint  Patient presents with  . Abdominal Pain  . Emesis    HPI [name] is a 51 y.o. female.  Patient is a 51 year old female with a history of diabetes and gastroparesis who presents with vomiting that started yesterday.  She has not been able to keep anything down.  She is also very anxious and states that she is having a panic attack.  She complains of upper abdominal pain consistent with her gastroparesis.  She denies any hematemesis or bilious vomiting.  No diarrhea.  No known fevers.  No cough or cold symptoms.  She has a history of recent admissions for similar symptoms with DKA.  She has taken her home medications without improvement in symptoms.      Past Medical History:  Diagnosis Date  . Depression   . Diabetes mellitus without complication ([place])   . Essential hypertension   . Gastroparesis   . GERD (gastroesophageal reflux disease)   . HLD (hyperlipidemia)     Patient Active Problem List   Diagnosis Date Noted  . DKA (diabetic ketoacidoses) ([place]) 06/16/2017  . Severe protein-calorie malnutrition ([place]) 04/17/2017  . Acute dystonic reaction due to drugs 04/01/2017  . Serotonin syndrome 03/25/2017  . Tardive dyskinesia   . Tachycardia 03/16/2017  . Diarrhea 03/05/2017  . DKA, type 1 ([place]) 03/18/2016  . Noncompliance with treatment plan 03/13/2016  . Essential hypertension 01/24/2016  . Abnormal ECG 11/27/2015  . HLD (hyperlipidemia)   . GERD (gastroesophageal reflux disease)   . Anemia, iron deficiency   . Vitamin B12 deficiency 08/16/2015  . Diabetic gastroparesis ([place])   . Diabetic neuropathy, type I diabetes mellitus ([place]) 05/18/2015  . Anxiety and depression 05/18/2015    Past Surgical History:  Procedure Laterality Date  . COLONOSCOPY  09/27/2014   at [facility]  .  ESOPHAGOGASTRODUODENOSCOPY  09/27/2014   at [facility], Dr Rolan Lipa. biospy neg for celiac, neg for H pylori.   . EYE SURGERY    . gailstones    . IR FLUORO GUIDE CV LINE RIGHT  02/01/2017  . IR FLUORO GUIDE CV LINE RIGHT  03/06/2017  . IR FLUORO GUIDE CV LINE RIGHT  03/25/2017  . IR GENERIC HISTORICAL  01/24/2016   IR FLUORO GUIDE CV LINE RIGHT 01/24/2016 Darrell K Allred, PA-C [facility]  . IR GENERIC HISTORICAL  01/24/2016   IR US GUIDE VASC ACCESS RIGHT 01/24/2016 Darrell K Allred, PA-C [facility]  . IR US GUIDE VASC ACCESS RIGHT  02/01/2017  . IR US GUIDE VASC ACCESS RIGHT  03/06/2017  . IR US GUIDE VASC ACCESS RIGHT  03/25/2017  . POSTERIOR VITRECTOMY AND MEMBRANE PEEL-LEFT EYE  09/28/2002  . POSTERIOR VITRECTOMY AND MEMBRANE PEEL-RIGHT EYE  03/16/2002  . RETINAL DETACHMENT SURGERY      OB History    No data available       Home Medications    Prior to Admission medications   Medication Sig Start Date End Date Taking? Authorizing Provider  benztropine (COGENTIN) 1 MG tablet Take 1 tablet (1 mg total) by mouth 2 (two) times daily. 04/03/17   Dessa Phi, DO  busPIRone (BUSPAR) 10 MG tablet Take 1 tablet (10 mg total) by mouth 3 (three) times daily. 05/18/17   Roxan Hockey, MD  clonazePAM (KLONOPIN) 0.5 MG disintegrating tablet Take 1 tablet (0.5 mg total)  by mouth 2 (two) times daily. 04/15/17   Gerlene Fee, NP  erythromycin base (E-MYCIN) 500 MG tablet Take 0.5 tablets (250 mg total) by mouth 3 (three) times daily after meals. 05/18/17 05/18/18  Roxan Hockey, MD  finasteride (PROSCAR) 5 MG tablet Take 1 tablet (5 mg total) by mouth daily. 04/04/17   Dessa Phi, DO  furosemide (LASIX) 20 MG tablet Take 1 tablet (20 mg total) by mouth daily as needed for fluid or edema (as needed for swelling). 05/18/17   Roxan Hockey, MD  insulin aspart (NOVOLOG FLEXPEN) 100 UNIT/ML FlexPen 6 Units. Inject as per sliding scale subcutaneously before meals and at bedtime 70 - 119  = 0 units 120 - 150 = 2 units 151 - 180 = 4 units 181 - 210 = 5 units Greater than 400 notify MD 211 - 240 = 6 units 241 - 270 = 8 units 271 - 300 = 9 units 301 - 330 = 10 units 331 - 360 = 11 units 361 - 400 = 12 units     [provider]  Insulin Detemir (LEVEMIR FLEXPEN) 100 UNIT/ML Pen Inject 12 Units into the skin 2 (two) times daily. 05/18/17   Roxan Hockey, MD  ondansetron (ZOFRAN-ODT) 8 MG disintegrating tablet Take 1 tablet (8 mg total) by mouth every 8 (eight) hours as needed for nausea or vomiting. 06/03/17   Law, Bea Graff, PA-C  pantoprazole (PROTONIX) 40 MG tablet Take 1 tablet (40 mg total) 2 (two) times daily by mouth. 05/09/17   Barton Dubois, MD  polyethylene glycol (MIRALAX / Floria Raveling) packet Take 17 g by mouth daily as needed for mild constipation.  04/11/17   [provider]  pregabalin (LYRICA) 150 MG capsule Take 1 capsule (150 mg total) by mouth daily. 06/17/17   Aline August, MD  pyridostigmine (MESTINON) 60 MG tablet Take 30 mg by mouth 3 (three) times daily.     [provider]    Family History Family History  Problem Relation Age of Onset  . Cystic fibrosis Mother   . Hypertension Father   . Diabetes Brother   . Hypertension Maternal Grandmother     Social History Social History   Tobacco Use  . Smoking status: Never Smoker  . Smokeless tobacco: Never Used  Substance Use Topics  . Alcohol use: No  . Drug use: No     Allergies   Anesthetics, amide; Penicillins; Buprenorphine hcl; Encainide; and Metoclopramide   Review of Systems Review of Systems  Constitutional: Positive for fatigue. Negative for chills, diaphoresis and fever.  HENT: Negative for congestion, rhinorrhea and sneezing.   Eyes: Negative.   Respiratory: Negative for cough, chest tightness and shortness of breath.   Cardiovascular: Negative for chest pain and leg swelling.  Gastrointestinal: Positive for abdominal pain, nausea and vomiting.  Negative for blood in stool and diarrhea.  Genitourinary: Negative for difficulty urinating, flank pain, frequency and hematuria.  Musculoskeletal: Negative for arthralgias and back pain.  Skin: Negative for rash.  Neurological: Negative for dizziness, speech difficulty, weakness, numbness and headaches.     Physical Exam Updated Vital Signs BP 133/79 (BP Location: Right Arm)   Pulse (!) 105   Temp 98.1 F (36.7 C) (Oral)   Resp (!) 31   Ht 5\' 2"  (1.575 m)   Wt 54.9 kg (121 lb)   SpO2 100%   BMI 22.13 kg/m   Physical Exam  Constitutional: She is oriented to person, place, and time. She appears well-developed and  well-nourished.  Smells of ketones, dry mucous membranes  HENT:  Head: Normocephalic and atraumatic.  Eyes: Pupils are equal, round, and reactive to light.  Neck: Normal range of motion. Neck supple.  Cardiovascular: Regular rhythm and normal heart sounds. Tachycardia present.  Pulmonary/Chest: Effort normal and breath sounds normal. No respiratory distress. She has no wheezes. She has no rales. She exhibits no tenderness.  Abdominal: Soft. Bowel sounds are normal. There is tenderness (Positive tenderness across the upper abdomen). There is no rebound and no guarding.  Musculoskeletal: Normal range of motion. She exhibits no edema.  Lymphadenopathy:    She has no cervical adenopathy.  Neurological: She is alert and oriented to person, place, and time.  Skin: Skin is warm and dry. No rash noted.  Psychiatric: She has a normal mood and affect.     ED Treatments / Results  Labs (all labs ordered are listed, but only abnormal results are displayed) Labs Reviewed  COMPREHENSIVE METABOLIC PANEL - Abnormal; Notable for the following components:      Result Value   CO2 17 (*)    Glucose, Bld 398 (*)    Creatinine, Ser 1.21 (*)    ALT 13 (*)    Total Bilirubin 1.8 (*)    GFR calc non Af Amer 51 (*)    GFR calc Af Amer 59 (*)    Anion gap 17 (*)    All other  components within normal limits  CBC - Abnormal; Notable for the following components:   RDW 17.5 (*)    All other components within normal limits  URINALYSIS, ROUTINE W REFLEX MICROSCOPIC - Abnormal; Notable for the following components:   Glucose, UA >=500 (*)    Ketones, ur 80 (*)    Protein, ur 100 (*)    Squamous Epithelial / LPF 0-5 (*)    All other components within normal limits  I-STAT CHEM 8, ED - Abnormal; Notable for the following components:   Glucose, Bld 404 (*)    Calcium, Ion 1.08 (*)    TCO2 20 (*)    Hemoglobin 16.0 (*)    HCT 47.0 (*)    All other components within normal limits  CBG MONITORING, ED - Abnormal; Notable for the following components:   Glucose-Capillary 377 (*)    All other components within normal limits  LIPASE, BLOOD  I-STAT BETA HCG BLOOD, ED ([facility], [facility], [facility] ONLY)    EKG  EKG Interpretation  Date/Time:  Sunday June 23 2017 22:27:37 EST Ventricular Rate:  105 PR Interval:    QRS Duration: 95 QT Interval:  361 QTC Calculation: 478 R Axis:   -50 Text Interpretation:  Sinus tachycardia LAD, consider left anterior fascicular block Probable anteroseptal infarct, old Confirmed by Malvin Johns [phone]) on 06/23/2017 10:46:16 PM       Radiology No results found.  Procedures Procedures (including critical care time)  Medications Ordered in ED Medications  sodium chloride 0.9 % bolus 1,000 mL (1,000 mLs Intravenous New Bag/Given 06/23/17 2347)  dextrose 5 %-0.45 % sodium chloride infusion (not administered)  insulin regular (NOVOLIN R,HUMULIN R) 100 Units in sodium chloride 0.9 % 100 mL (1 Units/mL) infusion (not administered)  sodium chloride 0.9 % bolus 1,000 mL (0 mLs Intravenous Stopped 06/23/17 2347)  LORazepam (ATIVAN) injection 1 mg (1 mg Intramuscular Given 06/23/17 2145)  ondansetron (ZOFRAN-ODT) disintegrating tablet 4 mg (4 mg Oral Given 06/23/17 2144)     Initial Impression / Assessment and Plan / ED Course  I have  reviewed the triage vital signs and the nursing notes.  Pertinent labs & imaging results that were available during my care of the patient were reviewed by me and considered in my medical decision making (see chart for details).     Patient presents with vomiting abdominal pain related to her gastroparesis.  She has evidence of DKA with an increased anion gap and acidosis.  She was started on IV fluids and glucose stabilizer.  I spoke with Dr. Alcario Drought with the hospitalist service to admit the patient for further treatment.  CRITICAL CARE Performed by: Malvin Johns Total critical care time: 35 minutes Critical care time was exclusive of separately billable procedures and treating other patients. Critical care was necessary to treat or prevent imminent or life-threatening deterioration. Critical care was time spent personally by me on the following activities: development of treatment plan with patient and/or surrogate as well as nursing, discussions with consultants, evaluation of patient's response to treatment, examination of patient, obtaining history from patient or surrogate, ordering and performing treatments and interventions, ordering and review of laboratory studies, ordering and review of radiographic studies, pulse oximetry and re-evaluation of patient's condition.   Final Clinical Impressions(s) / ED Diagnoses   Final diagnoses:  Diabetic ketoacidosis without coma associated with type 2 diabetes mellitus [facility])  Gastroparesis    ED Discharge Orders    None       Malvin Johns, MD 06/23/17 2349

## 2017-06-23 NOTE — ED Notes (Signed)
Pt is a difficult stick, request blood draw in the back.

## 2017-06-23 NOTE — ED Notes (Signed)
Attempted to start IV twice, unsuccessful. Called charge Nurse for assistance.

## 2017-06-23 NOTE — ED Triage Notes (Signed)
Pt come in via EMS with nausea and vomiting with history of Chronic Gastroparesis. Pt is also diabetic. CBG 98  With EMS. VS 150/[id number] RA

## 2017-06-24 ENCOUNTER — Other Ambulatory Visit: Payer: Self-pay | Admitting: Adult Health

## 2017-06-24 DIAGNOSIS — Z79899 Other long term (current) drug therapy: Secondary | ICD-10-CM | POA: Diagnosis not present

## 2017-06-24 DIAGNOSIS — Z884 Allergy status to anesthetic agent status: Secondary | ICD-10-CM | POA: Diagnosis not present

## 2017-06-24 DIAGNOSIS — E1110 Type 2 diabetes mellitus with ketoacidosis without coma: Secondary | ICD-10-CM | POA: Diagnosis not present

## 2017-06-24 DIAGNOSIS — Z794 Long term (current) use of insulin: Secondary | ICD-10-CM | POA: Diagnosis not present

## 2017-06-24 DIAGNOSIS — K219 Gastro-esophageal reflux disease without esophagitis: Secondary | ICD-10-CM | POA: Diagnosis present

## 2017-06-24 DIAGNOSIS — E1010 Type 1 diabetes mellitus with ketoacidosis without coma: Secondary | ICD-10-CM | POA: Diagnosis not present

## 2017-06-24 DIAGNOSIS — T383X6A Underdosing of insulin and oral hypoglycemic [antidiabetic] drugs, initial encounter: Secondary | ICD-10-CM | POA: Diagnosis present

## 2017-06-24 DIAGNOSIS — E785 Hyperlipidemia, unspecified: Secondary | ICD-10-CM | POA: Diagnosis present

## 2017-06-24 DIAGNOSIS — I10 Essential (primary) hypertension: Secondary | ICD-10-CM | POA: Diagnosis present

## 2017-06-24 DIAGNOSIS — E1143 Type 2 diabetes mellitus with diabetic autonomic (poly)neuropathy: Secondary | ICD-10-CM | POA: Diagnosis present

## 2017-06-24 DIAGNOSIS — Z91128 Patient's intentional underdosing of medication regimen for other reason: Secondary | ICD-10-CM | POA: Diagnosis not present

## 2017-06-24 DIAGNOSIS — Z888 Allergy status to other drugs, medicaments and biological substances status: Secondary | ICD-10-CM | POA: Diagnosis not present

## 2017-06-24 DIAGNOSIS — Z880 Allergy status to penicillin: Secondary | ICD-10-CM | POA: Diagnosis not present

## 2017-06-24 DIAGNOSIS — K3184 Gastroparesis: Secondary | ICD-10-CM | POA: Diagnosis present

## 2017-06-24 DIAGNOSIS — F329 Major depressive disorder, single episode, unspecified: Secondary | ICD-10-CM | POA: Diagnosis present

## 2017-06-24 LAB — BASIC METABOLIC PANEL
ANION GAP: 7 (ref 5–15)
ANION GAP: 7 (ref 5–15)
Anion gap: 5 (ref 5–15)
Anion gap: 6 (ref 5–15)
BUN: 17 mg/dL (ref 6–20)
BUN: 16 mg/dL (ref 6–20)
BUN: 14 mg/dL (ref 6–20)
BUN: 14 mg/dL (ref 6–20)
CALCIUM: 8.3 mg/dL — AB (ref 8.9–10.3)
CALCIUM: 8.1 mg/dL — AB (ref 8.9–10.3)
CALCIUM: 7.8 mg/dL — AB (ref 8.9–10.3)
CALCIUM: 7.9 mg/dL — AB (ref 8.9–10.3)
CO2: 20 mmol/L — ABNORMAL LOW (ref 22–32)
CO2: 18 mmol/L — AB (ref 22–32)
CO2: 20 mmol/L — AB (ref 22–32)
CO2: 17 mmol/L — AB (ref 22–32)
CREATININE: 1 mg/dL (ref 0.44–1.00)
CREATININE: 0.84 mg/dL (ref 0.44–1.00)
Chloride: 110 mmol/L (ref 101–111)
Chloride: 111 mmol/L (ref 101–111)
Chloride: 108 mmol/L (ref 101–111)
Chloride: 109 mmol/L (ref 101–111)
Creatinine, Ser: 0.85 mg/dL (ref 0.44–1.00)
Creatinine, Ser: 0.95 mg/dL (ref 0.44–1.00)
GFR calc Af Amer: >60 mL/min (ref >60)
GFR calc Af Amer: >60 mL/min (ref >60)
GFR calc Af Amer: >60 mL/min (ref >60)
GFR calc Af Amer: >60 mL/min (ref >60)
GLUCOSE: 172 mg/dL — AB (ref 65–99)
GLUCOSE: 149 mg/dL — AB (ref 65–99)
GLUCOSE: 174 mg/dL — AB (ref 65–99)
GLUCOSE: 182 mg/dL — AB (ref 65–99)
Potassium: 4.3 mmol/L (ref 3.5–5.1)
Potassium: 4 mmol/L (ref 3.5–5.1)
Potassium: 4 mmol/L (ref 3.5–5.1)
Potassium: 4.5 mmol/L (ref 3.5–5.1)
Sodium: 137 mmol/L (ref 135–145)
Sodium: 136 mmol/L (ref 135–145)
Sodium: 133 mmol/L — ABNORMAL LOW (ref 135–145)
Sodium: 132 mmol/L — ABNORMAL LOW (ref 135–145)

## 2017-06-24 LAB — GLUCOSE, CAPILLARY
GLUCOSE-CAPILLARY: 153 mg/dL — AB (ref 65–99)
GLUCOSE-CAPILLARY: 131 mg/dL — AB (ref 65–99)
GLUCOSE-CAPILLARY: 56 mg/dL — AB (ref 65–99)
GLUCOSE-CAPILLARY: 71 mg/dL (ref 65–99)
GLUCOSE-CAPILLARY: 128 mg/dL — AB (ref 65–99)
Glucose-Capillary: 131 mg/dL — ABNORMAL HIGH (ref 65–99)
Glucose-Capillary: 138 mg/dL — ABNORMAL HIGH (ref 65–99)
Glucose-Capillary: 157 mg/dL — ABNORMAL HIGH (ref 65–99)
Glucose-Capillary: 175 mg/dL — ABNORMAL HIGH (ref 65–99)
Glucose-Capillary: 193 mg/dL — ABNORMAL HIGH (ref 65–99)
Glucose-Capillary: 312 mg/dL — ABNORMAL HIGH (ref 65–99)
Glucose-Capillary: 58 mg/dL — ABNORMAL LOW (ref 65–99)

## 2017-06-24 LAB — CBG MONITORING, ED
GLUCOSE-CAPILLARY: 339 mg/dL — AB (ref 65–99)
Glucose-Capillary: 242 mg/dL — ABNORMAL HIGH (ref 65–99)

## 2017-06-24 MED ORDER — POTASSIUM CHLORIDE 10 MEQ/100ML IV SOLN
10.0000 meq | INTRAVENOUS | Status: AC
  Administered 2017-06-24 (×2): 10 meq via INTRAVENOUS
  Filled 2017-06-24 (×2): qty 100

## 2017-06-24 MED ORDER — ENOXAPARIN SODIUM 40 MG/0.4ML ~~LOC~~ SOLN
40.0000 mg | SUBCUTANEOUS | Status: DC
  Administered 2017-06-24: 40 mg via SUBCUTANEOUS
  Filled 2017-06-24 (×3): qty 0.4

## 2017-06-24 MED ORDER — FINASTERIDE 5 MG PO TABS
5.0000 mg | ORAL_TABLET | Freq: Every day | ORAL | Status: DC
  Administered 2017-06-24 – 2017-06-25 (×2): 5 mg via ORAL
  Filled 2017-06-24 (×2): qty 1

## 2017-06-24 MED ORDER — PREGABALIN 75 MG PO CAPS
150.0000 mg | ORAL_CAPSULE | Freq: Every day | ORAL | Status: DC
  Administered 2017-06-24 – 2017-06-25 (×2): 150 mg via ORAL
  Filled 2017-06-24 (×2): qty 2

## 2017-06-24 MED ORDER — INSULIN DETEMIR 100 UNIT/ML ~~LOC~~ SOLN
6.0000 [IU] | Freq: Two times a day (BID) | SUBCUTANEOUS | Status: DC
  Administered 2017-06-25: 6 [IU] via SUBCUTANEOUS
  Filled 2017-06-24 (×3): qty 0.06

## 2017-06-24 MED ORDER — INSULIN ASPART 100 UNIT/ML ~~LOC~~ SOLN: 0.0000 [IU] | SUBCUTANEOUS | Status: DC

## 2017-06-24 MED ORDER — SODIUM CHLORIDE 0.9 % IV SOLN
INTRAVENOUS | Status: DC
  Administered 2017-06-24: 02:00:00 via INTRAVENOUS

## 2017-06-24 MED ORDER — INSULIN ASPART 100 UNIT/ML ~~LOC~~ SOLN: 0.0000 [IU] | Freq: Every day | SUBCUTANEOUS | Status: DC

## 2017-06-24 MED ORDER — ERYTHROMYCIN BASE 250 MG PO TABS
250.0000 mg | ORAL_TABLET | Freq: Three times a day (TID) | ORAL | Status: DC
  Administered 2017-06-24 – 2017-06-25 (×4): 250 mg via ORAL
  Filled 2017-06-24 (×8): qty 1

## 2017-06-24 MED ORDER — INSULIN ASPART 100 UNIT/ML ~~LOC~~ SOLN
0.0000 [IU] | SUBCUTANEOUS | Status: DC
  Administered 2017-06-24: 2 [IU] via SUBCUTANEOUS
  Administered 2017-06-24 (×2): 1 [IU] via SUBCUTANEOUS
  Administered 2017-06-25: 7 [IU] via SUBCUTANEOUS
  Administered 2017-06-25 (×2): 5 [IU] via SUBCUTANEOUS
  Administered 2017-06-25: 1 [IU] via SUBCUTANEOUS

## 2017-06-24 MED ORDER — CLONAZEPAM 0.5 MG PO TBDP
0.5000 mg | ORAL_TABLET | Freq: Two times a day (BID) | ORAL | Status: DC
  Administered 2017-06-24 – 2017-06-25 (×3): 0.5 mg via ORAL
  Filled 2017-06-24 (×3): qty 1

## 2017-06-24 MED ORDER — POLYETHYLENE GLYCOL 3350 17 G PO PACK: 17.0000 g | PACK | Freq: Every day | ORAL | Status: DC | PRN

## 2017-06-24 MED ORDER — DEXTROSE-NACL 5-0.45 % IV SOLN
INTRAVENOUS | Status: DC
  Administered 2017-06-24: 05:00:00 via INTRAVENOUS

## 2017-06-24 MED ORDER — BENZTROPINE MESYLATE 0.5 MG PO TABS
1.0000 mg | ORAL_TABLET | Freq: Two times a day (BID) | ORAL | Status: DC
  Administered 2017-06-24 – 2017-06-25 (×4): 1 mg via ORAL
  Filled 2017-06-24 (×4): qty 2

## 2017-06-24 MED ORDER — FLEET ENEMA 7-19 GM/118ML RE ENEM
1.0000 | ENEMA | Freq: Once | RECTAL | Status: AC
  Administered 2017-06-24: 1 via RECTAL
  Filled 2017-06-24: qty 1

## 2017-06-24 MED ORDER — PANTOPRAZOLE SODIUM 40 MG PO TBEC
40.0000 mg | DELAYED_RELEASE_TABLET | Freq: Two times a day (BID) | ORAL | Status: DC
  Administered 2017-06-24 – 2017-06-25 (×4): 40 mg via ORAL
  Filled 2017-06-24 (×4): qty 1

## 2017-06-24 MED ORDER — SODIUM CHLORIDE 0.9 % IV SOLN
INTRAVENOUS | Status: DC
  Administered 2017-06-24: 08:00:00 via INTRAVENOUS

## 2017-06-24 MED ORDER — INSULIN GLARGINE 100 UNIT/ML ~~LOC~~ SOLN
6.0000 [IU] | Freq: Once | SUBCUTANEOUS | Status: AC
  Administered 2017-06-24: 6 [IU] via SUBCUTANEOUS
  Filled 2017-06-24: qty 0.06

## 2017-06-24 MED ORDER — BUSPIRONE HCL 5 MG PO TABS
10.0000 mg | ORAL_TABLET | Freq: Three times a day (TID) | ORAL | Status: DC
  Administered 2017-06-24 – 2017-06-25 (×4): 10 mg via ORAL
  Filled 2017-06-24 (×3): qty 2
  Filled 2017-06-24: qty 1
  Filled 2017-06-24: qty 2

## 2017-06-24 MED ORDER — INSULIN ASPART 100 UNIT/ML ~~LOC~~ SOLN
0.0000 [IU] | Freq: Three times a day (TID) | SUBCUTANEOUS | Status: DC
  Administered 2017-06-25: 1 [IU] via SUBCUTANEOUS
  Administered 2017-06-25: 5 [IU] via SUBCUTANEOUS

## 2017-06-24 NOTE — Care Management Note (Signed)
Case Management Note  Patient Details  Name: [name] MRN: [id number] Date of Birth: 1966-02-03  Subjective/Objective:                  Hyperglycemia and compliance at home  Action/Plan: Date: June 24, 2017 Velva Harman, BSN, [place], [place] Chart and notes review for patient progress and needs. Will follow for case management and discharge needs. Next review date: [id number]  Expected Discharge Date:                  Expected Discharge Plan:  Home/Self Care  In-House Referral:     Discharge planning Services  CM Consult  Post Acute Care Choice:    Choice offered to:     DME Arranged:    DME Agency:     HH Arranged:    HH Agency:     Status of Service:  In process, will continue to follow  If discussed at Long Length of Stay Meetings, dates discussed:    Additional Comments:  Leeroy Cha, RN 06/24/2017, 8:13 AM

## 2017-06-24 NOTE — Progress Notes (Signed)
Inpatient Diabetes Program Recommendations  AACE/ADA: New Consensus Statement on Inpatient Glycemic Control (2015)  Target Ranges:  Prepandial:   less than 140 mg/dL      Peak postprandial:   less than 180 mg/dL (1-2 hours)      Critically ill patients:  140 - 180 mg/dL   Lab Results  Component Value Date   GLUCAP 175 (H) 06/24/2017   HGBA1C 9.1 (H) 06/17/2017    Review of Glycemic Control    Note patient admitted with DKA yet again. HgbA1C 9.1% - improved from 10.1% on 03/17/2017.  Well known to the Inpatient Diabetes Program.  This is patient's 10th admission since January 2018 for DKA/ Gastroparesis issues.  Also had 5 ED visits this past year.  Of note, patient just discharged home on 06/17/17 after admission for DKA.  Note patient transitioning to SQ insulin this AM.  Lantus 6 units given at 0540.  Will follow during hospitalization.  Thank you. Lorenda Peck, RD, LDN, CDE Inpatient Diabetes Coordinator [phone]

## 2017-06-24 NOTE — Plan of Care (Signed)
  Progressing Health Behavior/Discharge Planning: Ability to manage health-related needs will improve 06/24/2017 1011 - Progressing by Staci Righter, RN Clinical Measurements: Ability to maintain clinical measurements within normal limits will improve 06/24/2017 1011 - Progressing by Staci Righter, RN Will remain free from infection 06/24/2017 1011 - Progressing by Staci Righter, RN Diagnostic test results will improve 06/24/2017 1011 - Progressing by Staci Righter, RN

## 2017-06-24 NOTE — Progress Notes (Signed)
Nutrition Brief Note  Patient identified on the Malnutrition Screening Tool (MST) Report  Wt Readings from Last 15 Encounters:  06/24/17 127 lb 6.8 oz (57.8 kg)  06/16/17 121 lb 11.1 oz (55.2 kg)  06/11/17 134 lb 7.7 oz (61 kg)  05/16/17 131 lb 6.3 oz (59.6 kg)  05/06/17 140 lb (63.5 kg)  04/30/17 139 lb 12.8 oz (63.4 kg)  04/26/17 137 lb (62.1 kg)  04/15/17 138 lb 9.6 oz (62.9 kg)  04/04/17 135 lb 3.2 oz (61.3 kg)  04/03/17 140 lb 11.2 oz (63.8 kg)  03/17/17 136 lb 0.4 oz (61.7 kg)  03/05/17 132 lb 4.4 oz (60 kg)  02/01/17 128 lb 1.4 oz (58.1 kg)  01/30/17 135 lb 6.4 oz (61.4 kg)  01/17/17 137 lb 12.8 oz (62.5 kg)   PT with PMH significant for DM, gastroparesis, and multiple admission for DKA (8 for DKA, 2 for gastroparesis in the last six months). Presents this admission with vomiting for one day PTA. Spoke with pt at bedside who reports vomiting has resolved and her appetite is back to normal. Pt overall has great appetite until she starts feeling lethargic from increased blood sugars. Denies any recent wt loss from last admission. Pt noted to have gained 6 lb since 06/16/17.   Body mass index is 23.31 kg/m. Patient meets criteria for normal based on current BMI.   Current diet order is carb modified, patient is consuming approximately 75% of meals at this time. Labs and medications reviewed.   No nutrition interventions warranted at this time. If nutrition issues arise, please consult RD.   Mariana Single RD, LDN Clinical Nutrition Pager # [phone]

## 2017-06-24 NOTE — Progress Notes (Signed)
Hypoglycemic Event  CBG: 56  Treatment: 4 oz orange juice Symptoms: none  Follow-up CBG: Time: 2009 CBG Result:71 CBG @ 2024: 58 graham crackers and peanut butter given CBG 128 @ 2128 MD notified RE: HS Levemir 6 units, with order to hold medication d/t hypoglycemic episodes  Possible Reasons for Event: Pt states did not eat supper - was sleeping  Comments/MD notified:hypoglycemic protocol initiated    Carmin Richmond

## 2017-06-24 NOTE — H&P (Signed)
History and Physical    [name] RFF:[id number] DOB: 1965-11-23 DOA: 06/23/2017  PCP: [name], MD  Patient coming from: Home  I have personally briefly reviewed patient's old medical records in [place]  Chief Complaint: Abd pain, emesis  HPI: [name] is a 51 y.o. female with medical history significant of frequent and recurrent admits for DKA, gastroparesis as well.  She was last admitted 7 days ago with DKA.  This will be her 10th admission (8 for DKA, 2 for gastroparesis) in the last 6 months.  Patient presents to the ED with c/o vomiting.  Symptoms onset yesterday, persistent, worsening.  Pain located in upper abdomen, c/w gastroparesis.   ED Course: DKA with AG 17.   Review of Systems: As per HPI otherwise 10 point review of systems negative.   Past Medical History:  Diagnosis Date  . Depression   . Diabetes mellitus without complication ([place])   . Essential hypertension   . Gastroparesis   . GERD (gastroesophageal reflux disease)   . HLD (hyperlipidemia)     Past Surgical History:  Procedure Laterality Date  . COLONOSCOPY  09/27/2014   at [facility]  . ESOPHAGOGASTRODUODENOSCOPY  09/27/2014   at [facility], Dr Rolan Lipa. biospy neg for celiac, neg for H pylori.   . EYE SURGERY    . gailstones    . IR FLUORO GUIDE CV LINE RIGHT  02/01/2017  . IR FLUORO GUIDE CV LINE RIGHT  03/06/2017  . IR FLUORO GUIDE CV LINE RIGHT  03/25/2017  . IR GENERIC HISTORICAL  01/24/2016   IR FLUORO GUIDE CV LINE RIGHT 01/24/2016 Darrell K Allred, PA-C [facility]  . IR GENERIC HISTORICAL  01/24/2016   IR US GUIDE VASC ACCESS RIGHT 01/24/2016 Darrell K Allred, PA-C [facility]  . IR US GUIDE VASC ACCESS RIGHT  02/01/2017  . IR US GUIDE VASC ACCESS RIGHT  03/06/2017  . IR US GUIDE VASC ACCESS RIGHT  03/25/2017  . POSTERIOR VITRECTOMY AND MEMBRANE PEEL-LEFT EYE  09/28/2002  . POSTERIOR VITRECTOMY AND MEMBRANE PEEL-RIGHT EYE  03/16/2002  . RETINAL DETACHMENT SURGERY       reports that  has never smoked. she has never used smokeless tobacco. She reports that she does not drink alcohol or use drugs.  Allergies  Allergen Reactions  . Anesthetics, Amide Nausea And Vomiting  . Penicillins Diarrhea and Nausea And Vomiting    Has patient had a PCN reaction causing immediate rash, facial/tongue/throat swelling, SOB or lightheadedness with hypotension: Yes Has patient had a PCN reaction causing severe rash involving mucus membranes or skin necrosis: No Has patient had a PCN reaction that required hospitalization No Has patient had a PCN reaction occurring within the last 10 years: Yes  If all of the above answers are "NO", then may proceed with Cephalosporin use.   . Buprenorphine Hcl Rash  . Encainide Nausea And Vomiting  . Metoclopramide Other (See Comments)    Dystonia, muscle rigidity.     Family History  Problem Relation Age of Onset  . Cystic fibrosis Mother   . Hypertension Father   . Diabetes Brother   . Hypertension Maternal Grandmother      Prior to Admission medications   Medication Sig Start Date End Date Taking? Authorizing Provider  benztropine (COGENTIN) 1 MG tablet Take 1 tablet (1 mg total) by mouth 2 (two) times daily. 04/03/17  Yes Dessa Phi, DO  busPIRone (BUSPAR) 10 MG tablet Take 1 tablet (10 mg total) by mouth 3 (  three) times daily. 05/18/17  Yes Emokpae, Courage, MD  clonazePAM (KLONOPIN) 0.5 MG disintegrating tablet Take 1 tablet (0.5 mg total) by mouth 2 (two) times daily. 04/15/17  Yes Gerlene Fee, NP  erythromycin base (E-MYCIN) 500 MG tablet Take 0.5 tablets (250 mg total) by mouth 3 (three) times daily after meals. 05/18/17 05/18/18 Yes Emokpae, Courage, MD  finasteride (PROSCAR) 5 MG tablet Take 1 tablet (5 mg total) by mouth daily. 04/04/17  Yes Dessa Phi, DO  furosemide (LASIX) 20 MG tablet Take 1 tablet (20 mg total) by mouth daily as needed for fluid or edema (as needed for swelling). 05/18/17  Yes Emokpae,  Courage, MD  insulin aspart (NOVOLOG FLEXPEN) 100 UNIT/ML FlexPen 6 Units. Inject as per sliding scale subcutaneously before meals and at bedtime 70 - 119 = 0 units 120 - 150 = 2 units 151 - 180 = 4 units 181 - 210 = 5 units Greater than 400 notify MD 211 - 240 = 6 units 241 - 270 = 8 units 271 - 300 = 9 units 301 - 330 = 10 units 331 - 360 = 11 units 361 - 400 = 12 units    Yes [provider]  Insulin Detemir (LEVEMIR FLEXPEN) 100 UNIT/ML Pen Inject 12 Units into the skin 2 (two) times daily. 05/18/17  Yes Emokpae, Courage, MD  ondansetron (ZOFRAN-ODT) 8 MG disintegrating tablet Take 1 tablet (8 mg total) by mouth every 8 (eight) hours as needed for nausea or vomiting. 06/03/17  Yes Law, Alexandra M, PA-C  pantoprazole (PROTONIX) 40 MG tablet Take 1 tablet (40 mg total) 2 (two) times daily by mouth. 05/09/17  Yes Barton Dubois, MD  polyethylene glycol (MIRALAX / GLYCOLAX) packet Take 17 g by mouth daily as needed for mild constipation.  04/11/17  Yes [provider]  pregabalin (LYRICA) 150 MG capsule Take 1 capsule (150 mg total) by mouth daily. 06/17/17  Yes Aline August, MD    Physical Exam: Vitals:   06/24/17 0130 06/24/17 0200 06/24/17 0300 06/24/17 0310  BP: 137/75 119/67  139/61  Pulse: 99 97  94  Resp: 10 18  (!) 31  Temp:   98.5 F (36.9 C)   TempSrc:   Oral   SpO2: 100% 98%  100%  Weight:   57.8 kg (127 lb 6.8 oz)   Height:   5\' 2"  (1.575 m)     Constitutional: NAD, calm, comfortable Eyes: PERRL, lids and conjunctivae normal ENMT: Mucous membranes are moist. Posterior pharynx clear of any exudate or lesions.Normal dentition.  Neck: normal, supple, no masses, no thyromegaly Respiratory: clear to auscultation bilaterally, no wheezing, no crackles. Normal respiratory effort. No accessory muscle use.  Cardiovascular: Regular rate and rhythm, no murmurs / rubs / gallops. No extremity edema. 2+ pedal pulses. No carotid bruits.  Abdomen: no  tenderness, no masses palpated. No hepatosplenomegaly. Bowel sounds positive.  Musculoskeletal: no clubbing / cyanosis. No joint deformity upper and lower extremities. Good ROM, no contractures. Normal muscle tone.  Skin: no rashes, lesions, ulcers. No induration Neurologic: CN 2-12 grossly intact. Sensation intact, DTR normal. Strength 5/5 in all 4.  Psychiatric: Normal judgment and insight. Alert and oriented x 3. Normal mood.    Labs on Admission: I have personally reviewed following labs and imaging studies  CBC: Recent Labs  Lab 06/17/17 0500 06/23/17 2229 06/23/17 2237  WBC 6.2  --  8.4  NEUTROABS 3.4  --   --   HGB 9.6* 16.0* 12.3  HCT  29.4* 47.0* 37.4  MCV 81.9  --  82.0  PLT 236  --  456   Basic Metabolic Panel: Recent Labs  Lab 06/17/17 0500 06/23/17 2229 06/23/17 2237  NA 134* 138 136  K 4.6 4.4 4.4  CL 109 103 102  CO2 22  --  17*  GLUCOSE 326* 404* 398*  BUN 15 18 19   CREATININE 1.20* 1.00 1.21*  CALCIUM 8.0*  --  9.1   GFR: Estimated Creatinine Clearance: 44 mL/min (A) (by C-G formula based on SCr of 1.21 mg/dL (H)). Liver Function Tests: Recent Labs  Lab 06/23/17 2237  AST 20  ALT 13*  ALKPHOS 72  BILITOT 1.8*  PROT 7.3  ALBUMIN 3.7   Recent Labs  Lab 06/23/17 2237  LIPASE 18   No results for input(s): AMMONIA in the last 168 hours. Coagulation Profile: No results for input(s): INR, PROTIME in the last 168 hours. Cardiac Enzymes: No results for input(s): CKTOTAL, CKMB, CKMBINDEX, TROPONINI in the last 168 hours. BNP (last 3 results) No results for input(s): PROBNP in the last 8760 hours. HbA1C: No results for input(s): HGBA1C in the last 72 hours. CBG: Recent Labs  Lab 06/17/17 0744 06/17/17 1241 06/23/17 2346 06/24/17 0103 06/24/17 0226  GLUCAP 348* 215* 377* 339* 242*   Lipid Profile: No results for input(s): CHOL, HDL, LDLCALC, TRIG, CHOLHDL, LDLDIRECT in the last 72 hours. Thyroid Function Tests: No results for input(s):  TSH, T4TOTAL, FREET4, T3FREE, THYROIDAB in the last 72 hours. Anemia Panel: No results for input(s): VITAMINB12, FOLATE, FERRITIN, TIBC, IRON, RETICCTPCT in the last 72 hours. Urine analysis:    Component Value Date/Time   COLORURINE YELLOW 06/23/2017 1636   APPEARANCEUR CLEAR 06/23/2017 1636   LABSPEC 1.024 06/23/2017 1636   PHURINE 5.0 06/23/2017 1636   GLUCOSEU >=500 (A) 06/23/2017 1636   HGBUR NEGATIVE 06/23/2017 1636   BILIRUBINUR NEGATIVE 06/23/2017 1636   BILIRUBINUR neg 01/17/2017 1533   KETONESUR 80 (A) 06/23/2017 1636   PROTEINUR 100 (A) 06/23/2017 1636   UROBILINOGEN 0.2 01/17/2017 1533   UROBILINOGEN 0.2 04/27/2015 1608   NITRITE NEGATIVE 06/23/2017 1636   LEUKOCYTESUR NEGATIVE 06/23/2017 1636    Radiological Exams on Admission: No results found.  EKG: Independently reviewed.  Assessment/Plan Principal Problem:   DKA (diabetic ketoacidoses) ([facility]) Active Problems:   Diabetic gastroparesis ([place])   Essential hypertension    1. DKA - 1. DKA pathway 2. IVF per pathway 3. BMP Q4H 4. Insulin gtt 2. Diabetic gastroparesis - 1. Continue home meds for now 2. Zofran PRN 3. HTN - continue home meds  DVT prophylaxis: Lovenox Code Status: Full Family Communication: No family in room Disposition Plan: Home after admit Consults called: None Admission status: Admit to inpatient - inpatient status for treatment of DKA   Etta Quill DO [facility] Pager [phone]  If 7AM-7PM, please contact day team taking care of patient [email or web address] Password [email or web address]  06/24/2017, 3:23 AM

## 2017-06-24 NOTE — Progress Notes (Signed)
PROGRESS NOTE  Subjective: [name] is a 51 y.o. female with a history of IDDM, gastroparesis who presented to the ED for vomiting associated with epigastric pain consistent with prior episodes of gastroparesis. Admitted for DKA and rapidly transitioned off insulin gtt earlier this morning. Tolerating breakfast.    Objective: BP (!) 121/52   Pulse 90   Temp 98 F (36.7 C) (Oral)   Resp 16   Ht 5\' 2"  (1.575 m)   Wt 57.8 kg (127 lb 6.8 oz)   SpO2 100%   BMI 23.31 kg/m   Gen: Non-toxic female in no distress Pulm: Clear and nonlabored on room air  CV: RRR, no murmur, no JVD, no edema GI: Soft, NT, ND, +BS  Neuro: Alert and oriented. No focal deficits. Skin: No rashes, lesions no ulcers  Assessment & Plan: DKA in IDDM: Resolved DKA quickly.  - Converted to lantus 6u this AM, plan to restart levemir this PM, but will watch CBGs on sensitive SSI for now as per oral intake is uncertain.  - Transfer to floor, possible DC 06/25/17.  Diabetic gastroparesis: x4 years, developed TD on reglan, so on erythromycin currently.  - Continue erythromycin - Advance diet as tolerated.  - Prn zofran ordered  Other plan per H&P this AM by Dr. Alcario Drought.   Vance Gather, MD [facility] Pager [phone] 06/24/2017, 9:33 AM

## 2017-06-25 DIAGNOSIS — K3184 Gastroparesis: Secondary | ICD-10-CM

## 2017-06-25 DIAGNOSIS — I10 Essential (primary) hypertension: Secondary | ICD-10-CM

## 2017-06-25 DIAGNOSIS — E1143 Type 2 diabetes mellitus with diabetic autonomic (poly)neuropathy: Secondary | ICD-10-CM

## 2017-06-25 LAB — GLUCOSE, CAPILLARY
GLUCOSE-CAPILLARY: 134 mg/dL — AB (ref 65–99)
GLUCOSE-CAPILLARY: 251 mg/dL — AB (ref 65–99)
Glucose-Capillary: 285 mg/dL — ABNORMAL HIGH (ref 65–99)

## 2017-06-25 LAB — BASIC METABOLIC PANEL
ANION GAP: 4 — AB (ref 5–15)
BUN: 15 mg/dL (ref 6–20)
CHLORIDE: 109 mmol/L (ref 101–111)
CO2: 21 mmol/L — AB (ref 22–32)
Calcium: 8.3 mg/dL — ABNORMAL LOW (ref 8.9–10.3)
Creatinine, Ser: 1.07 mg/dL — ABNORMAL HIGH (ref 0.44–1.00)
GFR calc non Af Amer: 59 mL/min — ABNORMAL LOW (ref >60)
Glucose, Bld: 277 mg/dL — ABNORMAL HIGH (ref 65–99)
POTASSIUM: 4.8 mmol/L (ref 3.5–5.1)
Sodium: 134 mmol/L — ABNORMAL LOW (ref 135–145)

## 2017-06-25 NOTE — Progress Notes (Signed)
Discharge instructions explained to pt, pt states understanding no questions asked.  Pt states she is tired of giving herself Injections and is going to speak to her MD about going on the insulin pump. Appetite is good. Sister picking pt up and bringing her home.

## 2017-06-25 NOTE — Discharge Summary (Signed)
Physician Discharge Summary  [name] DGU:[id number] DOB: Mar 25, 1966 DOA: 06/23/2017  PCP: [name], MD  Admit date: 06/23/2017 Discharge date: 06/25/2017  Admitted From: Home Disposition: Home   Recommendations for Outpatient Follow-up:  1. Follow up with PCP in 1-2 weeks, has been referred to endocrinology but has not made appointments due to such frequent admissions. May benefit from [place].   Home Health: None Equipment/Devices: None Discharge Condition: Stable CODE STATUS: Full Diet recommendation: Carb-modified  Brief/Interim Summary: [name] is a 52 y.o. female with a history of IDDM, gastroparesis who presented to the ED 12/30 for vomiting associated with epigastric pain consistent with prior episodes of gastroparesis. Because she wasn't eating, she didn't take insulin. She was admitted for DKA and rapidly transitioned off insulin gtt to subcutaneous insulin on 12/31. She feels better and has tolerated a carb-modified diet. Diabetes coordinator has provided further education and the patient is stable for discharge.   Discharge Diagnoses:  Principal Problem:   DKA (diabetic ketoacidoses) ([place]) Active Problems:   Diabetic gastroparesis ([place])   Essential hypertension  DKA in IDDM: Resolved DKA quickly. Had mild hypoglycemic episode 12/31 due to sleeping through dinner, stable since. 8th admission in past 6 months.  - Restart home medications, educated to continue some basal insulin when not taking significant per oral intake.  - Follow up with PCP, pt has been referred to endocrinology and missed 2 appointments due to admissions to the hospital.   Diabetic gastroparesis: x4 years, apparently developed TD on reglan, so on erythromycin currently. Tolerating diet without N/V/abd pain.  - Continue erythromycin  Diabetic neuropathy: Continue lyrica  GERD: Continue PPI  Depression: Continue buspar  Discharge Instructions Discharge Instructions     Diet Carb Modified   Complete by:  As directed    Discharge instructions   Complete by:  As directed    You were admitted for DKA which has resolved.  - Continue taking insulin as you were. - If you become unable to take anything by mouth, seek medical attention right away.  - It's important to follow up with your PCP and endocrinology as soon as possible.     Allergies as of 06/25/2017      Reactions   Anesthetics, Amide Nausea And Vomiting   Penicillins Diarrhea, Nausea And Vomiting   Has patient had a PCN reaction causing immediate rash, facial/tongue/throat swelling, SOB or lightheadedness with hypotension: Yes Has patient had a PCN reaction causing severe rash involving mucus membranes or skin necrosis: No Has patient had a PCN reaction that required hospitalization No Has patient had a PCN reaction occurring within the last 10 years: Yes  If all of the above answers are "NO", then may proceed with Cephalosporin use.   Buprenorphine Hcl Rash   Encainide Nausea And Vomiting   Metoclopramide Other (See Comments)   Dystonia, muscle rigidity.       Medication List    TAKE these medications   benztropine 1 MG tablet Commonly known as:  COGENTIN Take 1 tablet (1 mg total) by mouth 2 (two) times daily.   busPIRone 10 MG tablet Commonly known as:  BUSPAR Take 1 tablet (10 mg total) by mouth 3 (three) times daily.   clonazePAM 0.5 MG disintegrating tablet Commonly known as:  KLONOPIN Take 1 tablet (0.5 mg total) by mouth 2 (two) times daily.   erythromycin base 500 MG tablet Commonly known as:  E-MYCIN Take 0.5 tablets (250 mg total) by mouth 3 (three) times daily after  meals.   finasteride 5 MG tablet Commonly known as:  PROSCAR Take 1 tablet (5 mg total) by mouth daily.   furosemide 20 MG tablet Commonly known as:  LASIX Take 1 tablet (20 mg total) by mouth daily as needed for fluid or edema (as needed for swelling).   Insulin Detemir 100 UNIT/ML Pen Commonly known  as:  LEVEMIR FLEXPEN Inject 12 Units into the skin 2 (two) times daily.   NOVOLOG FLEXPEN 100 UNIT/ML FlexPen Generic drug:  insulin aspart 6 Units. Inject as per sliding scale subcutaneously before meals and at bedtime 70 - 119 = 0 units 120 - 150 = 2 units 151 - 180 = 4 units 181 - 210 = 5 units Greater than 400 notify MD 211 - 240 = 6 units 241 - 270 = 8 units 271 - 300 = 9 units 301 - 330 = 10 units 331 - 360 = 11 units 361 - 400 = 12 units   ondansetron 8 MG disintegrating tablet Commonly known as:  ZOFRAN-ODT Take 1 tablet (8 mg total) by mouth every 8 (eight) hours as needed for nausea or vomiting.   pantoprazole 40 MG tablet Commonly known as:  PROTONIX Take 1 tablet (40 mg total) 2 (two) times daily by mouth.   polyethylene glycol packet Commonly known as:  MIRALAX / GLYCOLAX Take 17 g by mouth daily as needed for mild constipation.   pregabalin 150 MG capsule Commonly known as:  LYRICA Take 1 capsule (150 mg total) by mouth daily.      Follow-up Information    [name], MD. Schedule an appointment as soon as possible for a visit in 1 week(s).   Specialty:  Family Medicine Contact information: [address] [place] [address] [phone]          Allergies  Allergen Reactions  . Anesthetics, Amide Nausea And Vomiting  . Penicillins Diarrhea and Nausea And Vomiting    Has patient had a PCN reaction causing immediate rash, facial/tongue/throat swelling, SOB or lightheadedness with hypotension: Yes Has patient had a PCN reaction causing severe rash involving mucus membranes or skin necrosis: No Has patient had a PCN reaction that required hospitalization No Has patient had a PCN reaction occurring within the last 10 years: Yes  If all of the above answers are "NO", then may proceed with Cephalosporin use.   . Buprenorphine Hcl Rash  . Encainide Nausea And Vomiting  . Metoclopramide Other (See Comments)    Dystonia, muscle rigidity.      Consultations:  Diabetes coordinator  Procedures/Studies: Dg Chest Portable 1 View  Result Date: 06/15/2017 CLINICAL DATA:  Central line placement. EXAM: PORTABLE CHEST 1 VIEW COMPARISON:  Chest radiograph performed 06/11/2017 FINDINGS: A right IJ line is noted ending about the mid SVC. The lungs are well-aerated and clear. There is no evidence of focal opacification, pleural effusion or pneumothorax. The cardiomediastinal silhouette is within normal limits. No acute osseous abnormalities are seen. IMPRESSION: 1. Right IJ line noted ending about the mid SVC. 2. No acute cardiopulmonary process seen. Electronically Signed   By: Garald Balding M.D.   On: 06/15/2017 23:57   Dg Chest Port 1 View  Result Date: 06/11/2017 CLINICAL DATA:  Nausea and vomiting for 2 days. EXAM: PORTABLE CHEST 1 VIEW COMPARISON:  Radiograph 03/25/2017 FINDINGS: Patient's chain obscures both apices. The cardiomediastinal contours are normal. The lungs are clear. Pulmonary vasculature is normal. No consolidation, pleural effusion, or pneumothorax. No acute osseous abnormalities are seen. IMPRESSION:  No acute abnormality. Electronically Signed   By: Jeb Levering M.D.   On: 06/11/2017 03:55   Dg Abd Portable 2v  Result Date: 06/11/2017 CLINICAL DATA:  Nausea and vomiting for 2 days. EXAM: PORTABLE ABDOMEN - 2 VIEW COMPARISON:  Radiographs 03/30/2017 FINDINGS: No bowel dilatation to suggest obstruction. No evidence of free air, overlying monitoring devices partially limit evaluation. Moderate to large colonic stool burden. No abnormal rectal distention. Cholecystectomy clips in the right upper quadrant. No abnormal soft tissue calcifications. IMPRESSION: Moderate to large colonic stool burden suggesting constipation. No bowel obstruction or evidence of free air. Electronically Signed   By: Jeb Levering M.D.   On: 06/11/2017 03:54   Subjective: Tolerated breakfast. No abd pain, N/V/D. Feels reglan helped her more  than erythromycin but had tongue movements that were abnormal so had to stop. Feels ready to go home.   Discharge Exam: BP 130/68 (BP Location: Right Arm)   Pulse 80   Temp 98 F (36.7 C) (Oral)   Resp 18   Ht 5\' 2"  (1.575 m)   Wt 62.9 kg (138 lb 10.7 oz)   SpO2 99%   BMI 25.36 kg/m   General: Pt is alert, awake, not in acute distress Cardiovascular: RRR, S1/S2 +, no rubs, no gallops Respiratory: CTA bilaterally, no wheezing, no rhonchi Abdominal: Soft, NT, ND, bowel sounds + Extremities: No edema, no cyanosis  Labs: Basic Metabolic Panel: Recent Labs  Lab 06/24/17 0339 06/24/17 0758 06/24/17 1043 06/24/17 1339 06/25/17 0411  NA 137 136 133* 132* 134*  K 4.3 4.0 4.0 4.5 4.8  CL 110 111 108 109 109  CO2 20* 18* 20* 17* 21*  GLUCOSE 172* 149* 174* 182* 277*  BUN 17 16 14 14 15   CREATININE 1.00 0.84 0.85 0.95 1.07*  CALCIUM 8.3* 8.1* 7.8* 7.9* 8.3*   Liver Function Tests: Recent Labs  Lab 06/23/17 2237  AST 20  ALT 13*  ALKPHOS 72  BILITOT 1.8*  PROT 7.3  ALBUMIN 3.7   Recent Labs  Lab 06/23/17 2237  LIPASE 18   CBC: Recent Labs  Lab 06/23/17 2229 06/23/17 2237  WBC  --  8.4  HGB 16.0* 12.3  HCT 47.0* 37.4  MCV  --  82.0  PLT  --  382   CBG: Recent Labs  Lab 06/24/17 2024 06/24/17 2128 06/24/17 2348 06/25/17 0344 06/25/17 0749  GLUCAP 58* 128* 312* 285* 134*   Urinalysis    Component Value Date/Time   COLORURINE YELLOW 06/23/2017 1636   APPEARANCEUR CLEAR 06/23/2017 1636   LABSPEC 1.024 06/23/2017 1636   PHURINE 5.0 06/23/2017 1636   GLUCOSEU >=500 (A) 06/23/2017 1636   HGBUR NEGATIVE 06/23/2017 1636   BILIRUBINUR NEGATIVE 06/23/2017 1636   BILIRUBINUR neg 01/17/2017 1533   KETONESUR 80 (A) 06/23/2017 1636   PROTEINUR 100 (A) 06/23/2017 1636   UROBILINOGEN 0.2 01/17/2017 1533   UROBILINOGEN 0.2 04/27/2015 1608   NITRITE NEGATIVE 06/23/2017 1636   LEUKOCYTESUR NEGATIVE 06/23/2017 1636    Microbiology Recent Results (from the  past 240 hour(s))  MRSA PCR Screening     Status: None   Collection Time: 06/16/17  3:20 AM  Result Value Ref Range Status   MRSA by PCR NEGATIVE NEGATIVE Final    Comment:        The GeneXpert MRSA Assay (FDA approved for NASAL specimens only), is one component of a comprehensive MRSA colonization surveillance program. It is not intended to diagnose MRSA infection nor to guide or monitor treatment  for MRSA infections.     Time coordinating discharge: Approximately 40 minutes  Vance Gather, MD  [facility] 06/25/2017, 9:04 AM Pager [phone]

## 2017-06-27 ENCOUNTER — Telehealth: Payer: Self-pay

## 2017-06-27 NOTE — Telephone Encounter (Signed)
[facility] Post-discharge Follow-Up Phone Call:  Date of Discharge: 06/25/2017 Principal Discharge Diagnosis(es): DKA, diabetic gastroparesis Post-discharge Communication: (Clearly document all attempts clearly and date contact made) call placed to # [phone] and the call was dropped after several rings.  No option for leaving a voicemail message.   Call Completed: No

## 2017-06-28 ENCOUNTER — Telehealth: Payer: Self-pay | Admitting: Family Medicine

## 2017-06-28 NOTE — Telephone Encounter (Signed)
[facility] Post-discharge Follow-Up Phone Call:  Date of Discharge: 06/25/2017 Principal Discharge Diagnosis(es): DKA, diabetic gastroparesis Post-discharge Communication: (Clearly document all attempts clearly and date contact made) discharge call placed to patient # [phone] to check on her status. No answer. Call drops after several rings, and cannot leave voice message.    Call Completed: No

## 2017-07-01 ENCOUNTER — Telehealth: Payer: Self-pay

## 2017-07-01 NOTE — Telephone Encounter (Signed)
[facility] Post-discharge Follow-Up Phone Call:  Date of Discharge: 06/25/2017 Principal Discharge Diagnosis(es):  DKA, diabetic gastroparesis Post-discharge Communication: (Clearly document all attempts clearly and date contact made) call received from the patient.  She stated that her phone had been turned off and she needed to wait for her son to pay the bill to have it turned back on. It was turned on yesterday.  Call Completed: Yes                    With Whom: patient Interpreter Needed: no     Please check all that apply:  X  Patient is knowledgeable of his/her condition(s) and/or treatment. X  Patient is caring for self at home.  - she lives with her sister but her sister works all day.  ? Patient is receiving assist at home from family and/or caregiver. Family and/or caregiver is knowledgeable of patient's condition(s) and/or treatment. ? Patient is receiving home health services. If so, name of agency.     Medication Reconciliation:  ? Medication list reviewed with patient. X  Patient obtained all discharge medications. If not, why? - she said she did not have a list of discharge medications. As per AVS, no changes were made to her prior medication list.  She said that she has all of her medications but will need some refills. Instructed her to bring all of her medications with her to her appointment tomorrow.  She explained that she has been feeling " pretty good" but has not been able to eat too much. She stated that she has been taking half of her levemir, so she is taking 6 units twice daily and will take novolog 4 units occasionally. She stated that she has a glucometer and has been checking her blood sugars but has not been keeping a log even though she understands that she should be recording her blood sugars. She noted that her blood sugars have been " up and down."  She also explained that she had an episode of hypoglycemia in the past week. Her blood sugar was  52 and she then ate a banana, grits and had some sweet tea.  She stated that she felt better and did not check her blood sugar after eating.     Activities of Daily Living:  X Independent ? Needs assist (describe; ? home DME used) ? Total Care (describe, ? home DME used)   Community resources in place for patient:  X  None  ? Home Health/Home DME ? Assisted Living ? Support Group          Patient Education:  She said that she understands that she needs to schedule an appointment with endocrinology. No that her phone has been turned back on, she will be able to call and schedule.          Questions/Concerns discussed: Overall, she stated that she is feeling " alright." She confirmed her appointment for tomorrow - 07/02/17 @ 1400 but stated that she needs transportation.  Informed her that a cab could be ordered for her as she is participating in the [place]. [place] Team Lead notified of the need for a cab.

## 2017-07-02 ENCOUNTER — Telehealth: Payer: Self-pay | Admitting: Family Medicine

## 2017-07-02 ENCOUNTER — Other Ambulatory Visit: Payer: Self-pay | Admitting: Pharmacist

## 2017-07-02 ENCOUNTER — Ambulatory Visit: Payer: Medicaid Other | Attending: Family Medicine | Admitting: Family Medicine

## 2017-07-02 ENCOUNTER — Telehealth: Payer: Self-pay

## 2017-07-02 ENCOUNTER — Encounter: Payer: Self-pay | Admitting: Family Medicine

## 2017-07-02 VITALS — BP 150/81 | HR 94 | Temp 97.6°F | Ht 62.0 in | Wt 131.8 lb

## 2017-07-02 DIAGNOSIS — Z880 Allergy status to penicillin: Secondary | ICD-10-CM | POA: Diagnosis not present

## 2017-07-02 DIAGNOSIS — R030 Elevated blood-pressure reading, without diagnosis of hypertension: Secondary | ICD-10-CM | POA: Diagnosis not present

## 2017-07-02 DIAGNOSIS — Z9889 Other specified postprocedural states: Secondary | ICD-10-CM | POA: Insufficient documentation

## 2017-07-02 DIAGNOSIS — E1040 Type 1 diabetes mellitus with diabetic neuropathy, unspecified: Secondary | ICD-10-CM

## 2017-07-02 DIAGNOSIS — E785 Hyperlipidemia, unspecified: Secondary | ICD-10-CM | POA: Insufficient documentation

## 2017-07-02 DIAGNOSIS — F32A Depression, unspecified: Secondary | ICD-10-CM

## 2017-07-02 DIAGNOSIS — F329 Major depressive disorder, single episode, unspecified: Secondary | ICD-10-CM

## 2017-07-02 DIAGNOSIS — R1084 Generalized abdominal pain: Secondary | ICD-10-CM | POA: Diagnosis not present

## 2017-07-02 DIAGNOSIS — Z9114 Patient's other noncompliance with medication regimen: Secondary | ICD-10-CM | POA: Insufficient documentation

## 2017-07-02 DIAGNOSIS — K219 Gastro-esophageal reflux disease without esophagitis: Secondary | ICD-10-CM | POA: Diagnosis not present

## 2017-07-02 DIAGNOSIS — I10 Essential (primary) hypertension: Secondary | ICD-10-CM | POA: Insufficient documentation

## 2017-07-02 DIAGNOSIS — K3184 Gastroparesis: Secondary | ICD-10-CM | POA: Diagnosis not present

## 2017-07-02 DIAGNOSIS — E1143 Type 2 diabetes mellitus with diabetic autonomic (poly)neuropathy: Secondary | ICD-10-CM | POA: Diagnosis not present

## 2017-07-02 DIAGNOSIS — D649 Anemia, unspecified: Secondary | ICD-10-CM | POA: Diagnosis not present

## 2017-07-02 DIAGNOSIS — Z91199 Patient's noncompliance with other medical treatment and regimen due to unspecified reason: Secondary | ICD-10-CM

## 2017-07-02 DIAGNOSIS — Z9111 Patient's noncompliance with dietary regimen: Secondary | ICD-10-CM | POA: Diagnosis not present

## 2017-07-02 DIAGNOSIS — Z794 Long term (current) use of insulin: Secondary | ICD-10-CM | POA: Diagnosis not present

## 2017-07-02 DIAGNOSIS — Z9119 Patient's noncompliance with other medical treatment and regimen: Secondary | ICD-10-CM | POA: Diagnosis not present

## 2017-07-02 DIAGNOSIS — Z888 Allergy status to other drugs, medicaments and biological substances status: Secondary | ICD-10-CM | POA: Insufficient documentation

## 2017-07-02 DIAGNOSIS — R413 Other amnesia: Secondary | ICD-10-CM | POA: Diagnosis not present

## 2017-07-02 DIAGNOSIS — F419 Anxiety disorder, unspecified: Secondary | ICD-10-CM

## 2017-07-02 DIAGNOSIS — Z79899 Other long term (current) drug therapy: Secondary | ICD-10-CM | POA: Diagnosis not present

## 2017-07-02 LAB — GLUCOSE, POCT (MANUAL RESULT ENTRY): POC Glucose: 320 mg/dl — AB (ref 70–99)

## 2017-07-02 MED ORDER — ONDANSETRON 8 MG PO TBDP: 8.0000 mg | ORAL_TABLET | Freq: Three times a day (TID) | ORAL | 0 refills | Status: DC | PRN

## 2017-07-02 MED ORDER — INSULIN PEN NEEDLE 31G X 5 MM MISC: 2 refills | Status: DC

## 2017-07-02 MED ORDER — PANTOPRAZOLE SODIUM 40 MG PO TBEC: 40.0000 mg | DELAYED_RELEASE_TABLET | Freq: Every day | ORAL | 3 refills | Status: DC

## 2017-07-02 MED ORDER — INSULIN PEN NEEDLE 31G X 6 MM MISC: 0 refills | Status: DC

## 2017-07-02 MED ORDER — PREGABALIN 150 MG PO CAPS: 150.0000 mg | ORAL_CAPSULE | Freq: Every day | ORAL | 2 refills | Status: DC

## 2017-07-02 MED ORDER — ERYTHROMYCIN BASE 500 MG PO TABS: 250.0000 mg | ORAL_TABLET | Freq: Three times a day (TID) | ORAL | 0 refills | Status: DC

## 2017-07-02 MED ORDER — BUSPIRONE HCL 10 MG PO TABS: 10.0000 mg | ORAL_TABLET | Freq: Three times a day (TID) | ORAL | 2 refills | Status: DC

## 2017-07-02 NOTE — Patient Instructions (Signed)
Gastroparesis °Gastroparesis, also called delayed gastric emptying, is a condition in which food takes longer than normal to empty from the stomach. The condition is usually long-lasting (chronic). °What are the causes? °This condition may be caused by: °· An endocrine disorder, such as hypothyroidism or diabetes. Diabetes is the most common cause of this condition. °· A nervous system disease, such as Parkinson disease or multiple sclerosis. °· Cancer, infection, or surgery of the stomach or vagus nerve. °· A connective tissue disorder, such as scleroderma. °· Certain medicines. ° °In most cases, the cause is not known. °What increases the risk? °This condition is more likely to develop in: °· People with certain disorders, including endocrine disorders, eating disorders, amyloidosis, and scleroderma. °· People with certain diseases, including Parkinson disease or multiple sclerosis. °· People with cancer or infection of the stomach or vagus nerve. °· People who have had surgery on the stomach or vagus nerve. °· People who take certain medicines. °· Women. ° °What are the signs or symptoms? °Symptoms of this condition include: °· An early feeling of fullness when eating. °· Nausea. °· Weight loss. °· Vomiting. °· Heartburn. °· Abdominal bloating. °· Inconsistent blood glucose levels. °· Lack of appetite. °· Acid from the stomach coming up into the esophagus (gastroesophageal reflux). °· Spasms of the stomach. ° °Symptoms may come and go. °How is this diagnosed? °This condition is diagnosed with tests, such as: °· Tests that check how long it takes food to move through the stomach and intestines. These tests include: °? Upper gastrointestinal (GI) series. In this test, X-rays of the intestines are taken after you drink a liquid. The liquid makes the intestines show up better on the X-rays. °? Gastric emptying scintigraphy. In this test, scans are taken after you eat food that contains a small amount of radioactive  material. °? Wireless capsule GI monitoring system. This test involves swallowing a capsule that records information about movement through the stomach. °· Gastric manometry. This test measures electrical and muscular activity in the stomach. It is done with a thin tube that is passed down the throat and into the stomach. °· Endoscopy. This test checks for abnormalities in the lining of the stomach. It is done with a long, thin tube that is passed down the throat and into the stomach. °· An ultrasound. This test can help rule out gallbladder disease or pancreatitis as a cause of your symptoms. It uses sound waves to take pictures of the inside of your body. ° °How is this treated? °There is no cure for gastroparesis. This condition may be managed with: °· Treatment of the underlying condition causing the gastroparesis. °· Lifestyle changes, including exercise and dietary changes. Dietary changes can include: °? Changes in what and when you eat. °? Eating smaller meals more often. °? Eating low-fat foods. °? Eating low-fiber forms of high-fiber foods, such as cooked vegetables instead of raw vegetables. °? Having liquid foods in place of solid foods. Liquid foods are easier to digest. °· Medicines. These may be given to control nausea and vomiting and to stimulate stomach muscles. °· Getting food through a feeding tube. This may be done in severe cases. °· A gastric neurostimulator. This is a device that is inserted into the body with surgery. It helps improve stomach emptying and control nausea and vomiting. ° °Follow these instructions at home: °· Follow your health care provider's instructions about exercise and diet. °· Take medicines only as directed by your health care provider. °Contact a   health care provider if: °· Your symptoms do not improve with treatment. °· You have new symptoms. °Get help right away if: °· You have severe abdominal pain that does not improve with treatment. °· You have nausea that does  not go away. °· You cannot keep fluids down. °This information is not intended to replace advice given to you by your health care provider. Make sure you discuss any questions you have with your health care provider. °Document Released: 06/11/2005 Document Revised: 11/17/2015 Document Reviewed: 06/07/2014 °Elsevier Interactive Patient Education © 2018 [employer] ° °

## 2017-07-02 NOTE — Telephone Encounter (Signed)
Call placed to patient [phone], to inform her that a driver from [employer] will be at her location today at 1:30pm to pick her up and bring her to her appointment at [facility]. Patient understood and had no further questions.

## 2017-07-02 NOTE — Telephone Encounter (Signed)
Met with the patient when she was in the clinic today. She expressed concerns about managing her medical appointments stating her " short term memory is bad."  She also missed appointments due to her frequent hospitalizations and ED visits. She stated that she needs to go to her GI doctor in [place] but has no transportation at this time. She also noted that she needs to contact [facility] and Dr Darleene Cleaver ( psych). She has registered for medicaid transportation but is not interested in [facility] at this time.   She was agreeable to another referral to [facility] for case management. She requested that the home visits be kept to less than 30 minutes as she tends to get anxious after that.  The referral was faxed to [facility].  She is still living with her sister and spoke about the lack of support from her sister as well as her son. She feels she is on her own for managing all of her needs.   She stated that she filed a disability application in 11/5033 but has not been able to find out the status of the application. She noted that she had applied for disability about 2 years ago and was denied but never appealed. She was agreeable to submitting a request to [employer] [place] to assist with determining the status of the current disability application. A referral was completed and faxed to [employer] [place] - Attn: Abelino Derrick.

## 2017-07-02 NOTE — Progress Notes (Signed)
Subjective:  Patient ID: [name], female    DOB: 1966-01-26  Age: 52 y.o. MRN: [id number]  CC: Hospitalization Follow-up   HPI [name] is a 52 year old female with a history of uncontrolled type 1 diabetes mellitus (A1c 9.1), diabetic neuropathy, diabetic gastroparesis, hypertension, anemia, anxiety and depression, multiple ED visits and hospitalizations for diabetic gastroparesis here for a follow-up visit.  She informs me today she is nauseous and has generalized abdominal pain.  Endorses running out of her erythromycin which she is requesting a refill of. She has been unable to keep follow-up appointments with GI at [facility] due to her multiple hospitalizations which have coincided with her appointments but then she further goes on to inform me she does not recall being seen by GI at [facility]. She attributes noncompliance with appointments to memory loss.  With regards to her diabetes mellitus I had referred her to endocrinology and she also missed that appointment as well. She endorses compliance with her insulin and did have an episode of hypoglycemia with a blood sugar in the 50s which responded to eating a snack. She informs me her fasting sugars have been in the 155 range.  Past Medical History:  Diagnosis Date  . Depression   . Diabetes mellitus without complication ([place])   . Essential hypertension   . Gastroparesis   . GERD (gastroesophageal reflux disease)   . HLD (hyperlipidemia)     Past Surgical History:  Procedure Laterality Date  . COLONOSCOPY  09/27/2014   at [facility]  . ESOPHAGOGASTRODUODENOSCOPY  09/27/2014   at [facility], Dr Rolan Lipa. biospy neg for celiac, neg for H pylori.   . EYE SURGERY    . gailstones    . IR FLUORO GUIDE CV LINE RIGHT  02/01/2017  . IR FLUORO GUIDE CV LINE RIGHT  03/06/2017  . IR FLUORO GUIDE CV LINE RIGHT  03/25/2017  . IR GENERIC HISTORICAL  01/24/2016   IR FLUORO GUIDE CV LINE RIGHT 01/24/2016 Darrell K  Allred, PA-C [facility]-INTERV RAD  . IR GENERIC HISTORICAL  01/24/2016   IR US GUIDE VASC ACCESS RIGHT 01/24/2016 Darrell K Allred, PA-C [facility]-INTERV RAD  . IR US GUIDE VASC ACCESS RIGHT  02/01/2017  . IR US GUIDE VASC ACCESS RIGHT  03/06/2017  . IR US GUIDE VASC ACCESS RIGHT  03/25/2017  . POSTERIOR VITRECTOMY AND MEMBRANE PEEL-LEFT EYE  09/28/2002  . POSTERIOR VITRECTOMY AND MEMBRANE PEEL-RIGHT EYE  03/16/2002  . RETINAL DETACHMENT SURGERY      Allergies  Allergen Reactions  . Anesthetics, Amide Nausea And Vomiting  . Penicillins Diarrhea and Nausea And Vomiting    Has patient had a PCN reaction causing immediate rash, facial/tongue/throat swelling, SOB or lightheadedness with hypotension: Yes Has patient had a PCN reaction causing severe rash involving mucus membranes or skin necrosis: No Has patient had a PCN reaction that required hospitalization No Has patient had a PCN reaction occurring within the last 10 years: Yes  If all of the above answers are "NO", then may proceed with Cephalosporin use.   . Buprenorphine Hcl Rash  . Encainide Nausea And Vomiting  . Metoclopramide Other (See Comments)    Dystonia, muscle rigidity.      Outpatient Medications Prior to Visit  Medication Sig Dispense Refill  . benztropine (COGENTIN) 1 MG tablet Take 1 tablet (1 mg total) by mouth 2 (two) times daily. 60 tablet 0  . clonazePAM (KLONOPIN) 0.5 MG disintegrating tablet Take 1 tablet (0.5 mg total) by mouth  2 (two) times daily. 60 tablet 0  . insulin aspart (NOVOLOG FLEXPEN) 100 UNIT/ML FlexPen 6 Units. Inject as per sliding scale subcutaneously before meals and at bedtime 70 - 119 = 0 units 120 - 150 = 2 units 151 - 180 = 4 units 181 - 210 = 5 units Greater than 400 notify MD 211 - 240 = 6 units 241 - 270 = 8 units 271 - 300 = 9 units 301 - 330 = 10 units 331 - 360 = 11 units 361 - 400 = 12 units     . Insulin Detemir (LEVEMIR FLEXPEN) 100 UNIT/ML Pen Inject 12 Units into the skin 2 (two) times daily.  15 mL 11  . polyethylene glycol (MIRALAX / GLYCOLAX) packet Take 17 g by mouth daily as needed for mild constipation.     . busPIRone (BUSPAR) 10 MG tablet Take 1 tablet (10 mg total) by mouth 3 (three) times daily. 90 tablet 2  . erythromycin base (E-MYCIN) 500 MG tablet Take 0.5 tablets (250 mg total) by mouth 3 (three) times daily after meals. 90 tablet 1  . finasteride (PROSCAR) 5 MG tablet Take 1 tablet (5 mg total) by mouth daily. 30 tablet 0  . furosemide (LASIX) 20 MG tablet Take 1 tablet (20 mg total) by mouth daily as needed for fluid or edema (as needed for swelling). 20 tablet 0  . ondansetron (ZOFRAN-ODT) 8 MG disintegrating tablet Take 1 tablet (8 mg total) by mouth every 8 (eight) hours as needed for nausea or vomiting. 12 tablet 0  . pantoprazole (PROTONIX) 40 MG tablet Take 1 tablet (40 mg total) 2 (two) times daily by mouth. 60 tablet 0  . pregabalin (LYRICA) 150 MG capsule Take 1 capsule (150 mg total) by mouth daily.     No facility-administered medications prior to visit.     ROS Review of Systems  Constitutional: Negative for activity change, appetite change and fatigue.  HENT: Negative for congestion, sinus pressure and sore throat.   Eyes: Negative for visual disturbance.  Respiratory: Negative for cough, chest tightness, shortness of breath and wheezing.   Cardiovascular: Negative for chest pain and palpitations.  Gastrointestinal: Positive for nausea. Negative for abdominal distention, abdominal pain and constipation.  Endocrine: Negative for polydipsia.  Genitourinary: Negative for dysuria and frequency.  Musculoskeletal: Negative for arthralgias and back pain.  Skin: Negative for rash.  Neurological: Negative for tremors, light-headedness and numbness.  Hematological: Does not bruise/bleed easily.  Psychiatric/Behavioral: Negative for agitation and behavioral problems.    Objective:  BP (!) 150/81   Pulse 94   Temp 97.6 F (36.4 C) (Oral)   Ht 5\' 2"   (1.575 m)   Wt 131 lb 12.8 oz (59.8 kg)   SpO2 94%   BMI 24.11 kg/m   BP/Weight 07/02/2017 06/25/2017 94/76/5465  Systolic BP 035 465 -  Diastolic BP 81 66 -  Wt. (Lbs) 131.8 - 138.67  BMI 24.11 - 25.36      Physical Exam  Constitutional: She is oriented to person, place, and time. She appears well-developed and well-nourished.  Cardiovascular: Normal rate, normal heart sounds and intact distal pulses.  No murmur heard. Pulmonary/Chest: Effort normal and breath sounds normal. She has no wheezes. She has no rales. She exhibits no tenderness.  Abdominal: Soft. Bowel sounds are normal. She exhibits no distension and no mass. There is no tenderness.  Musculoskeletal: Normal range of motion.  Neurological: She is alert and oriented to person, place, and time.  Skin: Skin  is warm and dry.  Psychiatric: She has a normal mood and affect.    CMP Latest Ref Rng & Units 06/25/2017 06/24/2017 06/24/2017  Glucose 65 - 99 mg/dL 277(H) 182(H) 174(H)  BUN 6 - 20 mg/dL 15 14 14   Creatinine 0.44 - 1.00 mg/dL 1.07(H) 0.95 0.85  Sodium 135 - 145 mmol/L 134(L) 132(L) 133(L)  Potassium 3.5 - 5.1 mmol/L 4.8 4.5 4.0  Chloride 101 - 111 mmol/L 109 109 108  CO2 22 - 32 mmol/L 21(L) 17(L) 20(L)  Calcium 8.9 - 10.3 mg/dL 8.3(L) 7.9(L) 7.8(L)  Total Protein 6.5 - 8.1 g/dL - - -  Total Bilirubin 0.3 - 1.2 mg/dL - - -  Alkaline Phos 38 - 126 U/L - - -  AST 15 - 41 U/L - - -  ALT 14 - 54 U/L - - -    Lipid Panel     Component Value Date/Time   CHOL 187 04/04/2012 0550   TRIG 110 04/04/2012 0550   HDL 49 04/04/2012 0550   CHOLHDL 3.8 04/04/2012 0550   VLDL 22 04/04/2012 0550   LDLCALC 116 (H) 04/04/2012 0550     Lab Results  Component Value Date   HGBA1C 9.1 (H) 06/17/2017    Assessment & Plan:   1. Diabetic neuropathy, type I diabetes mellitus ([place]) Controlled-she has been out of her Lyrica which I have refilled Poor control also secondary to noncompliance - POCT glucose (manual entry) -  pregabalin (LYRICA) 150 MG capsule; Take 1 capsule (150 mg total) by mouth daily.  Dispense: 60 capsule; Refill: 2  2. Diabetic gastroparesis ([place]) Uncontrolled with recurrent hospitalizations for the same Patient advised to follow-up with GI at [facility] Better glycemic control should bring about some improvement in symptoms - ondansetron (ZOFRAN-ODT) 8 MG disintegrating tablet; Take 1 tablet (8 mg total) by mouth every 8 (eight) hours as needed for nausea or vomiting.  Dispense: 30 tablet; Refill: 0 - erythromycin base (E-MYCIN) 500 MG tablet; Take 0.5 tablets (250 mg total) by mouth 3 (three) times daily after meals.  Dispense: 90 tablet; Refill: 0 - pantoprazole (PROTONIX) 40 MG tablet; Take 1 tablet (40 mg total) by mouth daily.  Dispense: 30 tablet; Refill: 3  3. Anxiety and depression Uncontrolled Previously referred to mental health however she missed appointment - busPIRone (BUSPAR) 10 MG tablet; Take 1 tablet (10 mg total) by mouth 3 (three) times daily.  Dispense: 90 tablet; Refill: 2  4. Memory loss We are currently working with the case manager to obtain a care coordinator with [facility] for her who will assist with coordinating appointments and reminding her as she attributes missing appointments to memory loss  5. Noncompliance with treatment plan Reiterated the implications of noncompliance with medications and complications of her uncontrolled comorbid illnesses  6. Elevated blood pressure reading Previous history of hypertension Low-sodium diet and will reassess at next visit   Meds ordered this encounter  Medications  . busPIRone (BUSPAR) 10 MG tablet    Sig: Take 1 tablet (10 mg total) by mouth 3 (three) times daily.    Dispense:  90 tablet    Refill:  2  . pregabalin (LYRICA) 150 MG capsule    Sig: Take 1 capsule (150 mg total) by mouth daily.    Dispense:  60 capsule    Refill:  2  . ondansetron (ZOFRAN-ODT) 8 MG disintegrating tablet    Sig: Take 1 tablet (8 mg  total) by mouth every 8 (eight) hours as needed for nausea  or vomiting.    Dispense:  30 tablet    Refill:  0  . erythromycin base (E-MYCIN) 500 MG tablet    Sig: Take 0.5 tablets (250 mg total) by mouth 3 (three) times daily after meals.    Dispense:  90 tablet    Refill:  0  . pantoprazole (PROTONIX) 40 MG tablet    Sig: Take 1 tablet (40 mg total) by mouth daily.    Dispense:  30 tablet    Refill:  3    Follow-up: Return in about 1 month (around 08/02/2017) for follow up ofDiabetes and gastroparesis.   [name] MD

## 2017-07-03 ENCOUNTER — Encounter: Payer: Self-pay | Admitting: Family Medicine

## 2017-07-05 ENCOUNTER — Telehealth: Payer: Self-pay | Admitting: Family Medicine

## 2017-07-05 NOTE — Telephone Encounter (Signed)
Call placed to [facility] # [phone] regarding patient's referral. Spoke with Kara Pacer, team lead, and she informed me that referral was received that they will be contacting patient soon.

## 2017-07-08 ENCOUNTER — Encounter: Payer: Self-pay | Admitting: Pharmacist

## 2017-07-08 NOTE — Progress Notes (Signed)
PA completed and approved for Lyrica. Approval #[id number]

## 2017-07-09 ENCOUNTER — Inpatient Hospital Stay (HOSPITAL_COMMUNITY)
Admission: EM | Admit: 2017-07-09 | Discharge: 2017-07-12 | DRG: 638 | Disposition: A | Discharge status: Home Health | Payer: Medicaid Other | Attending: Internal Medicine | Admitting: Internal Medicine

## 2017-07-09 ENCOUNTER — Other Ambulatory Visit: Payer: Self-pay

## 2017-07-09 ENCOUNTER — Encounter (HOSPITAL_COMMUNITY): Payer: Self-pay | Admitting: Emergency Medicine

## 2017-07-09 DIAGNOSIS — Z91128 Patient's intentional underdosing of medication regimen for other reason: Secondary | ICD-10-CM

## 2017-07-09 DIAGNOSIS — Z885 Allergy status to narcotic agent status: Secondary | ICD-10-CM

## 2017-07-09 DIAGNOSIS — Z884 Allergy status to anesthetic agent status: Secondary | ICD-10-CM

## 2017-07-09 DIAGNOSIS — E1110 Type 2 diabetes mellitus with ketoacidosis without coma: Secondary | ICD-10-CM | POA: Diagnosis present

## 2017-07-09 DIAGNOSIS — E1010 Type 1 diabetes mellitus with ketoacidosis without coma: Principal | ICD-10-CM | POA: Diagnosis present

## 2017-07-09 DIAGNOSIS — Z9119 Patient's noncompliance with other medical treatment and regimen: Secondary | ICD-10-CM

## 2017-07-09 DIAGNOSIS — Z9111 Patient's noncompliance with dietary regimen: Secondary | ICD-10-CM

## 2017-07-09 DIAGNOSIS — I10 Essential (primary) hypertension: Secondary | ICD-10-CM | POA: Diagnosis present

## 2017-07-09 DIAGNOSIS — Z794 Long term (current) use of insulin: Secondary | ICD-10-CM

## 2017-07-09 DIAGNOSIS — N179 Acute kidney failure, unspecified: Secondary | ICD-10-CM | POA: Diagnosis present

## 2017-07-09 DIAGNOSIS — Z91199 Patient's noncompliance with other medical treatment and regimen due to unspecified reason: Secondary | ICD-10-CM

## 2017-07-09 DIAGNOSIS — Z789 Other specified health status: Secondary | ICD-10-CM

## 2017-07-09 DIAGNOSIS — Z880 Allergy status to penicillin: Secondary | ICD-10-CM

## 2017-07-09 DIAGNOSIS — K219 Gastro-esophageal reflux disease without esophagitis: Secondary | ICD-10-CM | POA: Diagnosis present

## 2017-07-09 DIAGNOSIS — F419 Anxiety disorder, unspecified: Secondary | ICD-10-CM | POA: Diagnosis present

## 2017-07-09 DIAGNOSIS — K5900 Constipation, unspecified: Secondary | ICD-10-CM | POA: Diagnosis present

## 2017-07-09 DIAGNOSIS — E1043 Type 1 diabetes mellitus with diabetic autonomic (poly)neuropathy: Secondary | ICD-10-CM | POA: Diagnosis present

## 2017-07-09 DIAGNOSIS — Z888 Allergy status to other drugs, medicaments and biological substances status: Secondary | ICD-10-CM

## 2017-07-09 DIAGNOSIS — E1069 Type 1 diabetes mellitus with other specified complication: Secondary | ICD-10-CM | POA: Diagnosis present

## 2017-07-09 DIAGNOSIS — F322 Major depressive disorder, single episode, severe without psychotic features: Secondary | ICD-10-CM | POA: Diagnosis present

## 2017-07-09 DIAGNOSIS — Z833 Family history of diabetes mellitus: Secondary | ICD-10-CM

## 2017-07-09 DIAGNOSIS — Z598 Other problems related to housing and economic circumstances: Secondary | ICD-10-CM

## 2017-07-09 DIAGNOSIS — Z818 Family history of other mental and behavioral disorders: Secondary | ICD-10-CM

## 2017-07-09 DIAGNOSIS — T383X6A Underdosing of insulin and oral hypoglycemic [antidiabetic] drugs, initial encounter: Secondary | ICD-10-CM | POA: Diagnosis present

## 2017-07-09 DIAGNOSIS — K3184 Gastroparesis: Secondary | ICD-10-CM | POA: Diagnosis present

## 2017-07-09 DIAGNOSIS — E785 Hyperlipidemia, unspecified: Secondary | ICD-10-CM | POA: Diagnosis present

## 2017-07-09 LAB — CBG MONITORING, ED: Glucose-Capillary: 504 mg/dL (ref 65–99)

## 2017-07-09 MED ORDER — ONDANSETRON HCL 4 MG/2ML IJ SOLN
4.0000 mg | Freq: Once | INTRAMUSCULAR | Status: AC
  Administered 2017-07-10: 4 mg via INTRAVENOUS
  Filled 2017-07-09: qty 2

## 2017-07-09 MED ORDER — SODIUM CHLORIDE 0.9 % IV BOLUS (SEPSIS)
1000.0000 mL | Freq: Once | INTRAVENOUS | Status: AC
  Administered 2017-07-10: 1000 mL via INTRAVENOUS

## 2017-07-09 MED ORDER — LORAZEPAM 2 MG/ML IJ SOLN
1.0000 mg | Freq: Once | INTRAMUSCULAR | Status: AC
  Administered 2017-07-10: 1 mg via INTRAVENOUS
  Filled 2017-07-09: qty 1

## 2017-07-09 NOTE — ED Triage Notes (Signed)
Pt arriving from home with hyperglycemia, nausea, and weakness present since Saturday. CBG > 600 HR 120. Pt received 4mg  Zofran IM prior to arrival. Pt A&O x4 Resp 18 146/70

## 2017-07-09 NOTE — ED Notes (Signed)
Bed: HD89 Expected date:  Expected time:  Means of arrival:  Comments: EMS 52 yo female hyperglycemia with vomiting/CBG 600's

## 2017-07-10 ENCOUNTER — Inpatient Hospital Stay (HOSPITAL_COMMUNITY): Payer: Medicaid Other

## 2017-07-10 ENCOUNTER — Other Ambulatory Visit: Payer: Self-pay | Admitting: Pharmacist

## 2017-07-10 DIAGNOSIS — Z794 Long term (current) use of insulin: Secondary | ICD-10-CM | POA: Diagnosis not present

## 2017-07-10 DIAGNOSIS — F419 Anxiety disorder, unspecified: Secondary | ICD-10-CM | POA: Diagnosis present

## 2017-07-10 DIAGNOSIS — K3184 Gastroparesis: Secondary | ICD-10-CM | POA: Diagnosis present

## 2017-07-10 DIAGNOSIS — K5900 Constipation, unspecified: Secondary | ICD-10-CM | POA: Diagnosis present

## 2017-07-10 DIAGNOSIS — F322 Major depressive disorder, single episode, severe without psychotic features: Secondary | ICD-10-CM | POA: Diagnosis present

## 2017-07-10 DIAGNOSIS — I10 Essential (primary) hypertension: Secondary | ICD-10-CM | POA: Diagnosis present

## 2017-07-10 DIAGNOSIS — Z880 Allergy status to penicillin: Secondary | ICD-10-CM | POA: Diagnosis not present

## 2017-07-10 DIAGNOSIS — E1043 Type 1 diabetes mellitus with diabetic autonomic (poly)neuropathy: Secondary | ICD-10-CM | POA: Diagnosis present

## 2017-07-10 DIAGNOSIS — Z9119 Patient's noncompliance with other medical treatment and regimen: Secondary | ICD-10-CM | POA: Diagnosis not present

## 2017-07-10 DIAGNOSIS — E785 Hyperlipidemia, unspecified: Secondary | ICD-10-CM | POA: Diagnosis present

## 2017-07-10 DIAGNOSIS — Z598 Other problems related to housing and economic circumstances: Secondary | ICD-10-CM | POA: Diagnosis not present

## 2017-07-10 DIAGNOSIS — Z818 Family history of other mental and behavioral disorders: Secondary | ICD-10-CM | POA: Diagnosis not present

## 2017-07-10 DIAGNOSIS — Z888 Allergy status to other drugs, medicaments and biological substances status: Secondary | ICD-10-CM | POA: Diagnosis not present

## 2017-07-10 DIAGNOSIS — Z885 Allergy status to narcotic agent status: Secondary | ICD-10-CM | POA: Diagnosis not present

## 2017-07-10 DIAGNOSIS — K219 Gastro-esophageal reflux disease without esophagitis: Secondary | ICD-10-CM | POA: Diagnosis present

## 2017-07-10 DIAGNOSIS — E1010 Type 1 diabetes mellitus with ketoacidosis without coma: Secondary | ICD-10-CM | POA: Diagnosis not present

## 2017-07-10 DIAGNOSIS — R112 Nausea with vomiting, unspecified: Secondary | ICD-10-CM | POA: Diagnosis not present

## 2017-07-10 DIAGNOSIS — N179 Acute kidney failure, unspecified: Secondary | ICD-10-CM | POA: Diagnosis present

## 2017-07-10 DIAGNOSIS — Z833 Family history of diabetes mellitus: Secondary | ICD-10-CM | POA: Diagnosis not present

## 2017-07-10 DIAGNOSIS — Z884 Allergy status to anesthetic agent status: Secondary | ICD-10-CM | POA: Diagnosis not present

## 2017-07-10 DIAGNOSIS — T383X6A Underdosing of insulin and oral hypoglycemic [antidiabetic] drugs, initial encounter: Secondary | ICD-10-CM | POA: Diagnosis present

## 2017-07-10 DIAGNOSIS — Z91128 Patient's intentional underdosing of medication regimen for other reason: Secondary | ICD-10-CM | POA: Diagnosis not present

## 2017-07-10 LAB — BLOOD GAS, VENOUS
Acid-base deficit: 10.8 mmol/L — ABNORMAL HIGH (ref 0.0–2.0)
Bicarbonate: 14.4 mmol/L — ABNORMAL LOW (ref 20.0–28.0)
FIO2: 21
O2 SAT: 83.9 %
Patient temperature: 98.6
pCO2, Ven: 31 mmHg — ABNORMAL LOW (ref 44.0–60.0)
pH, Ven: 7.288 (ref 7.250–7.430)
pO2, Ven: 53.2 mmHg — ABNORMAL HIGH (ref 32.0–45.0)

## 2017-07-10 LAB — GLUCOSE, CAPILLARY
GLUCOSE-CAPILLARY: 199 mg/dL — AB (ref 65–99)
GLUCOSE-CAPILLARY: 172 mg/dL — AB (ref 65–99)
GLUCOSE-CAPILLARY: 118 mg/dL — AB (ref 65–99)
GLUCOSE-CAPILLARY: 141 mg/dL — AB (ref 65–99)
Glucose-Capillary: 373 mg/dL — ABNORMAL HIGH (ref 65–99)
Glucose-Capillary: 302 mg/dL — ABNORMAL HIGH (ref 65–99)
Glucose-Capillary: 185 mg/dL — ABNORMAL HIGH (ref 65–99)
Glucose-Capillary: 147 mg/dL — ABNORMAL HIGH (ref 65–99)
Glucose-Capillary: 118 mg/dL — ABNORMAL HIGH (ref 65–99)
Glucose-Capillary: 134 mg/dL — ABNORMAL HIGH (ref 65–99)

## 2017-07-10 LAB — BASIC METABOLIC PANEL WITH GFR
Anion gap: 19 — ABNORMAL HIGH (ref 5–15)
BUN: 38 mg/dL — ABNORMAL HIGH (ref 6–20)
CO2: 17 mmol/L — ABNORMAL LOW (ref 22–32)
Calcium: 9.7 mg/dL (ref 8.9–10.3)
Chloride: 103 mmol/L (ref 101–111)
Creatinine, Ser: 2.25 mg/dL — ABNORMAL HIGH (ref 0.44–1.00)
GFR calc Af Amer: 28 mL/min — ABNORMAL LOW
GFR calc non Af Amer: 24 mL/min — ABNORMAL LOW
Glucose, Bld: 528 mg/dL (ref 65–99)
Potassium: 5 mmol/L (ref 3.5–5.1)
Sodium: 139 mmol/L (ref 135–145)

## 2017-07-10 LAB — BASIC METABOLIC PANEL
ANION GAP: 4 — AB (ref 5–15)
Anion gap: 8 (ref 5–15)
Anion gap: 6 (ref 5–15)
BUN: 31 mg/dL — ABNORMAL HIGH (ref 6–20)
BUN: 25 mg/dL — ABNORMAL HIGH (ref 6–20)
BUN: 20 mg/dL (ref 6–20)
CHLORIDE: 122 mmol/L — AB (ref 101–111)
CHLORIDE: 118 mmol/L — AB (ref 101–111)
CHLORIDE: 118 mmol/L — AB (ref 101–111)
CO2: 15 mmol/L — AB (ref 22–32)
CO2: 18 mmol/L — AB (ref 22–32)
CO2: 19 mmol/L — AB (ref 22–32)
CREATININE: 1.62 mg/dL — AB (ref 0.44–1.00)
Calcium: 8.4 mg/dL — ABNORMAL LOW (ref 8.9–10.3)
Calcium: 7.9 mg/dL — ABNORMAL LOW (ref 8.9–10.3)
Calcium: 7.3 mg/dL — ABNORMAL LOW (ref 8.9–10.3)
Creatinine, Ser: 1.28 mg/dL — ABNORMAL HIGH (ref 0.44–1.00)
Creatinine, Ser: 1.13 mg/dL — ABNORMAL HIGH (ref 0.44–1.00)
GFR calc non Af Amer: 36 mL/min — ABNORMAL LOW (ref >60)
GFR calc non Af Amer: 48 mL/min — ABNORMAL LOW (ref >60)
GFR calc non Af Amer: 56 mL/min — ABNORMAL LOW (ref >60)
GFR, EST AFRICAN AMERICAN: 42 mL/min — AB (ref >60)
GFR, EST AFRICAN AMERICAN: 56 mL/min — AB (ref >60)
GLUCOSE: 223 mg/dL — AB (ref 65–99)
GLUCOSE: 193 mg/dL — AB (ref 65–99)
Glucose, Bld: 120 mg/dL — ABNORMAL HIGH (ref 65–99)
Potassium: 4.7 mmol/L (ref 3.5–5.1)
Potassium: 3.8 mmol/L (ref 3.5–5.1)
Potassium: 3.3 mmol/L — ABNORMAL LOW (ref 3.5–5.1)
Sodium: 145 mmol/L (ref 135–145)
Sodium: 142 mmol/L (ref 135–145)
Sodium: 141 mmol/L (ref 135–145)

## 2017-07-10 LAB — URINALYSIS, ROUTINE W REFLEX MICROSCOPIC
BILIRUBIN URINE: NEGATIVE
KETONES UR: 80 mg/dL — AB
LEUKOCYTES UA: NEGATIVE
NITRITE: NEGATIVE
PROTEIN: 30 mg/dL — AB
Specific Gravity, Urine: 1.021 (ref 1.005–1.030)
Squamous Epithelial / LPF: NONE SEEN
pH: 5 (ref 5.0–8.0)

## 2017-07-10 LAB — MRSA PCR SCREENING: MRSA by PCR: NEGATIVE

## 2017-07-10 LAB — I-STAT CHEM 8, ED
BUN: 48 mg/dL — ABNORMAL HIGH (ref 6–20)
Calcium, Ion: 1.22 mmol/L (ref 1.15–1.40)
Chloride: 106 mmol/L (ref 101–111)
Creatinine, Ser: 2.2 mg/dL — ABNORMAL HIGH (ref 0.44–1.00)
Glucose, Bld: 547 mg/dL (ref 65–99)
HCT: 41 % (ref 36.0–46.0)
Hemoglobin: 13.9 g/dL (ref 12.0–15.0)
Potassium: 5.6 mmol/L — ABNORMAL HIGH (ref 3.5–5.1)
Sodium: 139 mmol/L (ref 135–145)
TCO2: 17 mmol/L — ABNORMAL LOW (ref 22–32)

## 2017-07-10 LAB — CBG MONITORING, ED: GLUCOSE-CAPILLARY: 482 mg/dL — AB (ref 65–99)

## 2017-07-10 MED ORDER — ACETAMINOPHEN 650 MG RE SUPP: 650.0000 mg | Freq: Four times a day (QID) | RECTAL | Status: DC | PRN

## 2017-07-10 MED ORDER — LIDOCAINE HCL (CARDIAC) 20 MG/ML IV SOLN
3.0000 mL | Freq: Once | INTRAVENOUS | Status: AC
  Administered 2017-07-10: 3 mL via INTRAVENOUS
  Filled 2017-07-10: qty 5

## 2017-07-10 MED ORDER — CLONAZEPAM 0.5 MG PO TBDP
0.5000 mg | ORAL_TABLET | Freq: Two times a day (BID) | ORAL | Status: DC
  Administered 2017-07-10 – 2017-07-12 (×4): 0.5 mg via ORAL
  Filled 2017-07-10 (×4): qty 1

## 2017-07-10 MED ORDER — SODIUM CHLORIDE 0.9 % IV SOLN: 250.0000 mL | INTRAVENOUS | Status: DC | PRN

## 2017-07-10 MED ORDER — ONDANSETRON HCL 4 MG PO TABS
4.0000 mg | ORAL_TABLET | Freq: Four times a day (QID) | ORAL | Status: DC | PRN
  Administered 2017-07-11: 4 mg via ORAL
  Filled 2017-07-10: qty 1

## 2017-07-10 MED ORDER — HEPARIN SODIUM (PORCINE) 5000 UNIT/ML IJ SOLN
5000.0000 [IU] | Freq: Three times a day (TID) | INTRAMUSCULAR | Status: DC
  Administered 2017-07-10 – 2017-07-11 (×4): 5000 [IU] via SUBCUTANEOUS
  Filled 2017-07-10 (×5): qty 1

## 2017-07-10 MED ORDER — PANTOPRAZOLE SODIUM 40 MG PO TBEC
40.0000 mg | DELAYED_RELEASE_TABLET | Freq: Every day | ORAL | Status: DC
  Administered 2017-07-10 – 2017-07-12 (×3): 40 mg via ORAL
  Filled 2017-07-10 (×3): qty 1

## 2017-07-10 MED ORDER — BOOST / RESOURCE BREEZE PO LIQD CUSTOM
1.0000 | Freq: Three times a day (TID) | ORAL | Status: DC
  Administered 2017-07-12: 1 via ORAL

## 2017-07-10 MED ORDER — BENZTROPINE MESYLATE 0.5 MG PO TABS
1.0000 mg | ORAL_TABLET | Freq: Two times a day (BID) | ORAL | Status: DC
  Administered 2017-07-10 – 2017-07-11 (×3): 1 mg via ORAL
  Filled 2017-07-10 (×4): qty 2

## 2017-07-10 MED ORDER — BUSPIRONE HCL 5 MG PO TABS
10.0000 mg | ORAL_TABLET | Freq: Three times a day (TID) | ORAL | Status: DC
  Administered 2017-07-10 – 2017-07-12 (×6): 10 mg via ORAL
  Filled 2017-07-10: qty 2
  Filled 2017-07-10: qty 1
  Filled 2017-07-10: qty 2
  Filled 2017-07-10 (×2): qty 1
  Filled 2017-07-10 (×2): qty 2

## 2017-07-10 MED ORDER — CHLORHEXIDINE GLUCONATE 0.12 % MT SOLN
15.0000 mL | Freq: Two times a day (BID) | OROMUCOSAL | Status: DC
  Administered 2017-07-11 (×2): 15 mL via OROMUCOSAL
  Filled 2017-07-10 (×3): qty 15

## 2017-07-10 MED ORDER — SODIUM CHLORIDE 0.9 % IV BOLUS (SEPSIS)
1000.0000 mL | Freq: Once | INTRAVENOUS | Status: AC
  Administered 2017-07-10: 1000 mL via INTRAVENOUS

## 2017-07-10 MED ORDER — INSULIN REGULAR HUMAN 100 UNIT/ML IJ SOLN
INTRAMUSCULAR | Status: DC
  Filled 2017-07-10: qty 1

## 2017-07-10 MED ORDER — SODIUM CHLORIDE 0.9% FLUSH: 3.0000 mL | INTRAVENOUS | Status: DC | PRN

## 2017-07-10 MED ORDER — DEXTROSE-NACL 5-0.45 % IV SOLN: INTRAVENOUS | Status: DC

## 2017-07-10 MED ORDER — SENNA 8.6 MG PO TABS
1.0000 | ORAL_TABLET | Freq: Two times a day (BID) | ORAL | Status: DC
  Administered 2017-07-10 – 2017-07-12 (×4): 8.6 mg via ORAL
  Filled 2017-07-10 (×4): qty 1

## 2017-07-10 MED ORDER — ALBUTEROL SULFATE (2.5 MG/3ML) 0.083% IN NEBU: 2.5000 mg | INHALATION_SOLUTION | RESPIRATORY_TRACT | Status: DC | PRN

## 2017-07-10 MED ORDER — SODIUM CHLORIDE 0.9 % IV SOLN: INTRAVENOUS | Status: DC

## 2017-07-10 MED ORDER — INSULIN PEN NEEDLE 31G X 5 MM MISC: 2 refills | Status: DC

## 2017-07-10 MED ORDER — TRAZODONE HCL 50 MG PO TABS: 50.0000 mg | ORAL_TABLET | Freq: Every evening | ORAL | Status: DC | PRN

## 2017-07-10 MED ORDER — SODIUM CHLORIDE 0.9 % IV SOLN
INTRAVENOUS | Status: DC
  Administered 2017-07-10: 06:00:00 via INTRAVENOUS

## 2017-07-10 MED ORDER — ERYTHROMYCIN BASE 250 MG PO TABS
250.0000 mg | ORAL_TABLET | Freq: Three times a day (TID) | ORAL | Status: DC
  Administered 2017-07-10 – 2017-07-12 (×6): 250 mg via ORAL
  Filled 2017-07-10 (×9): qty 1

## 2017-07-10 MED ORDER — ADULT MULTIVITAMIN W/MINERALS CH
1.0000 | ORAL_TABLET | Freq: Every day | ORAL | Status: DC
  Administered 2017-07-10 – 2017-07-12 (×3): 1 via ORAL
  Filled 2017-07-10 (×3): qty 1

## 2017-07-10 MED ORDER — PREGABALIN 75 MG PO CAPS
150.0000 mg | ORAL_CAPSULE | Freq: Every day | ORAL | Status: DC
  Administered 2017-07-10 – 2017-07-12 (×3): 150 mg via ORAL
  Filled 2017-07-10 (×3): qty 2

## 2017-07-10 MED ORDER — SODIUM CHLORIDE 0.9% FLUSH
3.0000 mL | Freq: Two times a day (BID) | INTRAVENOUS | Status: DC
  Administered 2017-07-11: 3 mL via INTRAVENOUS

## 2017-07-10 MED ORDER — SODIUM CHLORIDE 0.9 % IV SOLN
INTRAVENOUS | Status: DC
  Administered 2017-07-10: 4.9 [IU]/h via INTRAVENOUS
  Filled 2017-07-10: qty 1

## 2017-07-10 MED ORDER — ONDANSETRON HCL 4 MG/2ML IJ SOLN
4.0000 mg | Freq: Four times a day (QID) | INTRAMUSCULAR | Status: DC | PRN
  Administered 2017-07-10: 4 mg via INTRAVENOUS
  Filled 2017-07-10: qty 2

## 2017-07-10 MED ORDER — LABETALOL HCL 5 MG/ML IV SOLN
10.0000 mg | INTRAVENOUS | Status: DC | PRN
  Administered 2017-07-10: 10 mg via INTRAVENOUS
  Filled 2017-07-10 (×2): qty 4

## 2017-07-10 MED ORDER — ORAL CARE MOUTH RINSE
15.0000 mL | Freq: Two times a day (BID) | OROMUCOSAL | Status: DC
  Administered 2017-07-11 – 2017-07-12 (×3): 15 mL via OROMUCOSAL

## 2017-07-10 MED ORDER — SODIUM CHLORIDE 0.9 % IV SOLN
INTRAVENOUS | Status: DC
  Administered 2017-07-10: 13:00:00 via INTRAVENOUS

## 2017-07-10 MED ORDER — DEXTROSE-NACL 5-0.45 % IV SOLN
INTRAVENOUS | Status: DC
  Administered 2017-07-10 – 2017-07-11 (×3): via INTRAVENOUS

## 2017-07-10 MED ORDER — ONDANSETRON 4 MG PO TBDP: 8.0000 mg | ORAL_TABLET | Freq: Three times a day (TID) | ORAL | Status: DC | PRN

## 2017-07-10 MED ORDER — ACETAMINOPHEN 325 MG PO TABS
650.0000 mg | ORAL_TABLET | Freq: Four times a day (QID) | ORAL | Status: DC | PRN
  Administered 2017-07-10: 650 mg via ORAL
  Filled 2017-07-10 (×2): qty 2

## 2017-07-10 MED ORDER — POLYETHYLENE GLYCOL 3350 17 G PO PACK: 17.0000 g | PACK | Freq: Every day | ORAL | Status: DC | PRN

## 2017-07-10 NOTE — Progress Notes (Signed)
Inpatient Diabetes Program Recommendations  AACE/ADA: New Consensus Statement on Inpatient Glycemic Control (2015)  Target Ranges:  Prepandial:   less than 140 mg/dL      Peak postprandial:   less than 180 mg/dL (1-2 hours)      Critically ill patients:  140 - 180 mg/dL   Results for GALAXY, BORDEN (MRN [id number]) as of 07/10/2017 09:43  Ref. Range 07/10/2017 00:45  Sodium Latest Ref Range: 135 - 145 mmol/L 139  Potassium Latest Ref Range: 3.5 - 5.1 mmol/L 5.0  Chloride Latest Ref Range: 101 - 111 mmol/L 103  CO2 Latest Ref Range: 22 - 32 mmol/L 17 (L)  Glucose Latest Ref Range: 65 - 99 mg/dL 528 (HH)  BUN Latest Ref Range: 6 - 20 mg/dL 38 (H)  Creatinine Latest Ref Range: 0.44 - 1.00 mg/dL 2.25 (H)  Calcium Latest Ref Range: 8.9 - 10.3 mg/dL 9.7  Anion gap Latest Ref Range: 5 - 15  19 (H)    Admit with: DKA/ Gastroparesis  History: Type 1 DM, Gastroparesis  Home DM Meds: Levemir 12 units BID       Novolog 0-12 units TID AC + HS  Current Insulin Orders: IV Insulin drip       Patient well known to the Inpatient Diabetes Team.  Had 11 hospital admissions in 2018.  Not quite ready to transition to SQ insulin yet.  Anion Gap down to 8 but CO2 still a bit low at 15 on 7am BMET today.     MD- When patient ready to transition to SQ insulin (CO2 closer to 20), please make sure patient receives Levemir 1-2 hours prior to d/c of IV insulin drip.  Will also need Novolog Sensitive SSI at time of transition as well.      --Will follow patient during hospitalization--  Wyn Quaker RN, MSN, CDE Diabetes Coordinator Inpatient Glycemic Control Team Team Pager: [phone] (8a-5p)

## 2017-07-10 NOTE — Progress Notes (Signed)
Initial Nutrition Assessment  DOCUMENTATION CODES:   (Will assess for malnutrition at follow-up)  INTERVENTION:  - Will order daily multivitamin with minerals.  - Diet advancement as medically feasible. - Continue to encourage PO intakes. - Will attempt NFPE at follow-up.   NUTRITION DIAGNOSIS:   Inadequate protein intake related to other (see comment)(current diet order) as evidenced by other (comment)(CLD does not meet estimated protein need).  GOAL:   Patient will meet greater than or equal to 90% of their needs  MONITOR:   PO intake, Diet advancement, Weight trends, Labs  REASON FOR ASSESSMENT:   Malnutrition Screening Tool  ASSESSMENT:   52 y.o. female with medical history significant of frequent and recurrent admits for DKA, gastroparesis as well. She was admitted with DKA and noted to have a 2 day hx of N/V PTA.   BMI indicates normal weight. No intakes documented since admission. Breakfast tray on bedside table and it appears that pt may have had a few sips of drinks and a few spoonfuls of broth but unsure. No family/visitors present. Pt does not arouse to name call x3. RD talked with RN about 30 minutes prior to entering pt's room. RN reported that pt was unable to provide much information to MD and that pt did not seem to comprehend some of the things MD was saying to her.   Unable to perform NFPE at this time but will attempt at follow-up. Per chart review, pt has lost 12 lbs (9% body weight) in the past 8 days; this would be considered significant for time frame, but need to talk with pt more about recent weight trends. She was hospitalized <1 month ago for DKA and has had 11 admissions within the past 6 months.   Medications reviewed; transitioning off of insulin drip, 40 mg oral Protonix/day. Labs reviewed; CBGs: 147-482 mg/dL since midnight, Cl: 122 mmol/L, BUN: 31 mg/dL, creatinine: 1.62 mg/dL, Ca: 8.4 mg/dL, GFR: 42 mL/min.   IVF: D5-1/2 NS @ 100 mL/hr (408  kcal).    NUTRITION - FOCUSED PHYSICAL EXAM:  Unable to complete/assess at this time and will attempt at follow-up  Diet Order:  Diet clear liquid Room service appropriate? Yes; Fluid consistency: Thin  EDUCATION NEEDS:   Not appropriate for education at this time  Skin:  Skin Assessment: Reviewed RN Assessment  Last BM:  1/15  Height:   Ht Readings from Last 1 Encounters:  07/10/17 5\' 2"  (1.575 m)    Weight:   Wt Readings from Last 1 Encounters:  07/10/17 119 lb 4.3 oz (54.1 kg)    Ideal Body Weight:  50 kg  BMI:  Body mass index is 21.81 kg/m.  Estimated Nutritional Needs:   Kcal:  1515-1730 (28-32 kcal/kg)  Protein:  55-65 grams  Fluid:  >/= 1.8 L/day     Jarome Matin, MS, RD, LDN, [facility] Inpatient Clinical Dietitian Pager # [phone] After hours/weekend pager # [phone]

## 2017-07-10 NOTE — Plan of Care (Signed)
Received signout from Lorre Munroe, [name] is a 52 year old female with pmh of DM type I with complications of gastroparesis and frequent episodes of DKA; who presents with elevated blood sugars.  I also had to be obtained due to poor IV access.  CBG noted to be 528 with anion gap 19, and AK I with creatinine noted to be 2.25 (baseline creatinine around 1).  Glucose stabilizer protocol initiated.  CBGs every 4 hours x3 ordered.  Admitted as inpatient to a stepdown bed.  Urinalysis had yet to be obtained.

## 2017-07-10 NOTE — ED Notes (Signed)
ED TO INPATIENT HANDOFF REPORT  Name/Age/Gender [name] 52 y.o. female  Code Status Code Status History    Date Active Date Inactive Code Status Order ID Comments User Context   06/24/2017 01:39 06/25/2017 17:49 Full Code [id number]  Etta Quill, DO ED   06/16/2017 03:24 06/17/2017 17:38 Full Code [id number]  Elwyn Reach, MD Inpatient   06/11/2017 03:24 06/12/2017 20:30 Full Code [id number]  Bonnielee Haff, MD ED   05/16/2017 17:56 05/18/2017 18:32 Full Code [id number]  Geradine Girt, DO Inpatient   05/06/2017 11:30 05/09/2017 20:49 Full Code [id number]  Donne Hazel, MD ED   03/25/2017 07:42 04/04/2017 01:44 Full Code [id number]  Kandice Hams, MD Inpatient   03/25/2017 06:53 03/25/2017 07:42 Full Code [id number]  Kandice Hams, MD Inpatient   03/16/2017 06:06 03/20/2017 20:40 Full Code [id number]  Jani Gravel, MD Inpatient   03/05/2017 18:36 03/09/2017 16:26 Full Code [id number]  Bonnielee Haff, MD Inpatient   02/05/2017 11:18 02/06/2017 17:06 Full Code [id number]  Merton Border, MD Inpatient   01/31/2017 21:22 02/03/2017 18:47 Full Code [id number]  Vashti Hey, MD Inpatient   09/27/2016 18:33 09/29/2016 20:30 Full Code [id number]  Doreatha Lew, MD Inpatient   04/20/2016 18:09 04/23/2016 19:37 Full Code [id number]  Hosie Poisson, MD Inpatient   04/06/2016 13:50 04/09/2016 18:29 Full Code [id number]  Albertine Patricia, MD ED   03/23/2016 01:22 03/26/2016 21:01 Full Code [id number]  Vianne Bulls, MD ED   03/18/2016 15:59 03/20/2016 17:41 Full Code [id number]  Mariel Aloe, MD Inpatient   02/19/2016 14:16 02/21/2016 17:32 Full Code [id number]  Caren Griffins, MD ED   01/24/2016 07:01 01/26/2016 20:01 Full Code [id number]  Karmen Bongo, MD Inpatient   01/19/2016 14:23 01/21/2016 17:38 Full Code [id number]  Thurnell Lose, MD ED   11/27/2015 22:01 12/01/2015 18:39 Full Code [id number]  Toy Baker, MD Inpatient   11/14/2015 04:05 11/16/2015 19:31 Full Code  [id number]  Ivor Costa, MD ED   09/02/2015 12:33 09/05/2015 22:12 Full Code [id number]  Kelvin Cellar, MD ED   07/29/2015 07:55 08/01/2015 23:03 Full Code [id number]  Reyne Dumas, MD ED   06/29/2015 20:27 07/03/2015 17:58 Full Code [id number]  Robbie Lis, MD Inpatient   06/22/2015 10:58 06/25/2015 18:47 Full Code [id number]  Radene Gunning, NP Inpatient   06/14/2015 18:29 06/20/2015 21:40 Full Code [id number]  Domenic Polite, MD Inpatient   05/26/2015 22:02 06/05/2015 19:29 Full Code [id number]  Reubin Milan, MD Inpatient   05/23/2015 07:17 05/24/2015 18:19 Full Code [id number]  Norval Morton, MD Inpatient   05/17/2015 12:21 05/20/2015 18:13 Full Code [id number]  Eugenie Filler, MD Inpatient   05/17/2015 11:09 05/17/2015 12:21 Full Code [id number]  Eugenie Filler, MD ED   04/19/2015 02:38 04/20/2015 22:29 Full Code [id number]  Rise Patience, MD Inpatient   04/05/2015 05:43 04/09/2015 19:25 Full Code [id number]  Rise Patience, MD Inpatient   04/05/2015 05:37 04/05/2015 05:43 Full Code [id number]  Despina Hick, RN Inpatient   03/09/2015 07:42 03/12/2015 19:11 Full Code [id number]  Theodis Blaze, MD Inpatient   02/20/2015 01:08 02/22/2015 19:08 Full Code [id number]  Toy Baker, MD Inpatient   02/08/2015 23:43 02/13/2015 15:32 Full Code [id number]  Lavina Hamman, MD ED   10/21/2014 00:57 10/23/2014 15:14 Full Code [id number]  Rise Patience, MD Inpatient   02/12/2013 23:16 02/17/2013 21:22 Full Code [id number]  Quintella Baton, MD Inpatient   11/25/2012 13:48 11/28/2012 19:04 Full Code [id number]  Elmarie Shiley, MD ED   11/05/2012 17:37 11/07/2012 16:20 Full Code [id number]  Sheila Oats, MD Inpatient   04/03/2012 02:27 04/05/2012 16:43 Full Code [id number]  Quintella Baton, MD Inpatient   03/24/2012 00:21 03/28/2012 17:51 Full Code [id number]  Leeroy Cha, RN ED      Home/SNF/Other Home  Chief Complaint hypoglyecima,weakness,nausea  Level of Care/Admitting  Diagnosis ED Disposition    ED Disposition Condition Comment   [facility] [[id number]]  Level of Care: Stepdown [14]  Admit to SDU based on following criteria: Other see comments  Comments: dka  Diagnosis: DKA, type 1 [facility]) [[id number]]  Admitting Physician: Norval Morton [[id number]]  Attending Physician: Norval Morton [[id number]]  Estimated length of stay: past midnight tomorrow  Certification:: I certify this patient will need inpatient services for at least 2 midnights  PT Class (Do Not Modify): Inpatient [101]  PT Acc Code (Do Not Modify): Private [1]       Medical History Past Medical History:  Diagnosis Date  . Depression   . Diabetes mellitus without complication ([place])   . Essential hypertension   . Gastroparesis   . GERD (gastroesophageal reflux disease)   . HLD (hyperlipidemia)     Allergies Allergies  Allergen Reactions  . Anesthetics, Amide Nausea And Vomiting  . Penicillins Diarrhea and Nausea And Vomiting    Has patient had a PCN reaction causing immediate rash, facial/tongue/throat swelling, SOB or lightheadedness with hypotension: Yes Has patient had a PCN reaction causing severe rash involving mucus membranes or skin necrosis: No Has patient had a PCN reaction that required hospitalization No Has patient had a PCN reaction occurring within the last 10 years: Yes  If all of the above answers are "NO", then may proceed with Cephalosporin use.   . Buprenorphine Hcl Rash  . Encainide Nausea And Vomiting  . Metoclopramide Other (See Comments)    Dystonia, muscle rigidity.     IV Location/Drains/Wounds Patient Lines/Drains/Airways Status   Active Line/Drains/Airways    Name:   Placement date:   Placement time:   Site:   Days:   Peripheral IV 07/10/17 Right;Anterior Wrist   07/10/17    0226    Wrist   less than 1   External Urinary Catheter   06/24/17    0400    -   16          Labs/Imaging Results for orders  placed or performed during the hospital encounter of 07/09/17 (from the past 48 hour(s))  CBG monitoring, ED     Status: Abnormal   Collection Time: 07/09/17  9:48 PM  Result Value Ref Range   Glucose-Capillary 504 (HH) 65 - 99 mg/dL   Comment 1 Notify RN    Comment 2 Document in Chart   I-stat chem 8, ed     Status: Abnormal   Collection Time: 07/10/17 12:11 AM  Result Value Ref Range   Sodium 139 135 - 145 mmol/L   Potassium 5.6 (H) 3.5 - 5.1 mmol/L   Chloride 106 101 - 111 mmol/L   BUN 48 (H) 6 - 20 mg/dL   Creatinine, Ser 2.20 (H) 0.44 - 1.00 mg/dL   Glucose, Bld 547 (HH) 65 - 99 mg/dL   Calcium, Ion 1.22 1.15 - 1.40 mmol/L   TCO2 17 (L) 22 - 32 mmol/L   Hemoglobin 13.9 12.0 - 15.0 g/dL   HCT 41.0 36.0 - 46.0 %   Comment NOTIFIED PHYSICIAN  Blood gas, venous     Status: Abnormal   Collection Time: 07/10/17 12:13 AM  Result Value Ref Range   FIO2 21.00    pH, Ven 7.288 7.250 - 7.430   pCO2, Ven 31.0 (L) 44.0 - 60.0 mmHg   pO2, Ven 53.2 (H) 32.0 - 45.0 mmHg   Bicarbonate 14.4 (L) 20.0 - 28.0 mmol/L   Acid-base deficit 10.8 (H) 0.0 - 2.0 mmol/L   O2 Saturation 83.9 %   Patient temperature 98.6    Collection site VEIN    Drawn by DRAWN BY RN    Sample type VENOUS   Basic metabolic panel     Status: Abnormal   Collection Time: 07/10/17 12:45 AM  Result Value Ref Range   Sodium 139 135 - 145 mmol/L   Potassium 5.0 3.5 - 5.1 mmol/L   Chloride 103 101 - 111 mmol/L   CO2 17 (L) 22 - 32 mmol/L   Glucose, Bld 528 (HH) 65 - 99 mg/dL    Comment: CRITICAL RESULT CALLED TO, READ BACK BY AND VERIFIED WITH: COHEN,L RN 1.16.19 @0147  ZANDO,C    BUN 38 (H) 6 - 20 mg/dL   Creatinine, Ser 2.25 (H) 0.44 - 1.00 mg/dL   Calcium 9.7 8.9 - 10.3 mg/dL   GFR calc non Af Amer 24 (L) >60 mL/min   GFR calc Af Amer 28 (L) >60 mL/min    Comment: (NOTE) The eGFR has been calculated using the CKD EPI equation. This calculation has not been validated in all clinical situations. eGFR's  persistently <60 mL/min signify possible Chronic Kidney Disease.    Anion gap 19 (H) 5 - 15  CBG monitoring, ED     Status: Abnormal   Collection Time: 07/10/17  2:30 AM  Result Value Ref Range   Glucose-Capillary 482 (H) 65 - 99 mg/dL   Comment 1 Notify RN    Comment 2 Document in Chart    No results found.  Pending Labs Unresulted Labs (From admission, onward)   Start     Ordered   07/10/17 4098  Basic metabolic panel  STAT Now then every 4 hours ,   STAT     07/10/17 0321   07/10/17 0409  Urinalysis, Routine w reflex microscopic  Once,   R     07/10/17 0408   07/10/17 0409  Urine culture  STAT,   STAT     07/10/17 0408   07/09/17 2204  CBC with Differential/Platelet  Once,   R     07/09/17 2203      Vitals/Pain Today's Vitals   07/10/17 0248 07/10/17 0300 07/10/17 0330 07/10/17 0400  BP: (!) 119/53 (!) 128/54 (!) 114/52 (!) 144/57  Pulse: (!) 117 (!) 116 (!) 114 (!) 113  Resp: 14 18 19  (!) 23  Temp:      TempSrc:      SpO2: 100%       Isolation Precautions No active isolations  Medications Medications  sodium chloride 0.9 % bolus 1,000 mL (not administered)    And  sodium chloride 0.9 % bolus 1,000 mL (1,000 mLs Intravenous New Bag/Given 07/10/17 0236)  0.9 %  sodium chloride infusion (not administered)  dextrose 5 %-0.45 % sodium chloride infusion (not administered)  insulin regular (NOVOLIN R,HUMULIN R) 100 Units in sodium chloride 0.9 % 100 mL (1 Units/mL) infusion (not administered)  sodium chloride 0.9 % bolus 1,000 mL (1,000 mLs Intravenous New Bag/Given 07/10/17 0054)  ondansetron (ZOFRAN) injection 4 mg (4 mg Intravenous  Given 07/10/17 0117)  LORazepam (ATIVAN) injection 1 mg (1 mg Intravenous Given 07/10/17 0117)  lidocaine (cardiac) 100 mg/82m (XYLOCAINE) 20 MG/ML injection 2% 3 mL (3 mLs Intravenous Given by Other 07/10/17 0054)    Mobility walks

## 2017-07-10 NOTE — ED Provider Notes (Signed)
[facility] DEPT Provider Note   CSN: [id number] Arrival date & time: 07/09/17  2130     History   Chief Complaint Chief Complaint  Patient presents with  . Hyperglycemia  . Nausea  . Weakness    HPI [name] is a 52 y.o. female.  HPI  Past Medical History:  Diagnosis Date  . Depression   . Diabetes mellitus without complication ([place])   . Essential hypertension   . Gastroparesis   . GERD (gastroesophageal reflux disease)   . HLD (hyperlipidemia)     Patient Active Problem List   Diagnosis Date Noted  . DKA (diabetic ketoacidoses) ([place]) 06/16/2017  . Severe protein-calorie malnutrition ([place]) 04/17/2017  . Acute dystonic reaction due to drugs 04/01/2017  . Serotonin syndrome 03/25/2017  . Tardive dyskinesia   . Tachycardia 03/16/2017  . Diarrhea 03/05/2017  . DKA, type 1 ([place]) 03/18/2016  . Noncompliance with treatment plan 03/13/2016  . Essential hypertension 01/24/2016  . Abnormal ECG 11/27/2015  . HLD (hyperlipidemia)   . GERD (gastroesophageal reflux disease)   . Anemia, iron deficiency   . Vitamin B12 deficiency 08/16/2015  . Diabetic gastroparesis ([place])   . Diabetic neuropathy, type I diabetes mellitus ([place]) 05/18/2015  . Anxiety and depression 05/18/2015    Past Surgical History:  Procedure Laterality Date  . COLONOSCOPY  09/27/2014   at [facility]  . ESOPHAGOGASTRODUODENOSCOPY  09/27/2014   at [facility], Dr Rolan Lipa. biospy neg for celiac, neg for H pylori.   . EYE SURGERY    . gailstones    . IR FLUORO GUIDE CV LINE RIGHT  02/01/2017  . IR FLUORO GUIDE CV LINE RIGHT  03/06/2017  . IR FLUORO GUIDE CV LINE RIGHT  03/25/2017  . IR GENERIC HISTORICAL  01/24/2016   IR FLUORO GUIDE CV LINE RIGHT 01/24/2016 Darrell K Allred, PA-C [facility]-INTERV RAD  . IR GENERIC HISTORICAL  01/24/2016   IR US GUIDE VASC ACCESS RIGHT 01/24/2016 Darrell K Allred, PA-C [facility]-INTERV RAD  . IR US GUIDE VASC ACCESS RIGHT  02/01/2017  . IR US GUIDE VASC ACCESS  RIGHT  03/06/2017  . IR US GUIDE VASC ACCESS RIGHT  03/25/2017  . POSTERIOR VITRECTOMY AND MEMBRANE PEEL-LEFT EYE  09/28/2002  . POSTERIOR VITRECTOMY AND MEMBRANE PEEL-RIGHT EYE  03/16/2002  . RETINAL DETACHMENT SURGERY      OB History    No data available       Home Medications    Prior to Admission medications   Medication Sig Start Date End Date Taking? Authorizing Provider  benztropine (COGENTIN) 1 MG tablet Take 1 tablet (1 mg total) by mouth 2 (two) times daily. 04/03/17   Dessa Phi, DO  busPIRone (BUSPAR) 10 MG tablet Take 1 tablet (10 mg total) by mouth 3 (three) times daily. 07/02/17   [name], MD  clonazePAM (KLONOPIN) 0.5 MG disintegrating tablet Take 1 tablet (0.5 mg total) by mouth 2 (two) times daily. 04/15/17   Gerlene Fee, NP  erythromycin base (E-MYCIN) 500 MG tablet Take 0.5 tablets (250 mg total) by mouth 3 (three) times daily after meals. 07/02/17 07/02/18  [name], MD  insulin aspart (NOVOLOG FLEXPEN) 100 UNIT/ML FlexPen 6 Units. Inject as per sliding scale subcutaneously before meals and at bedtime 70 - 119 = 0 units 120 - 150 = 2 units 151 - 180 = 4 units 181 - 210 = 5 units Greater than 400 notify MD 211 - 240 = 6 units 241 - 270 = 8  units 271 - 300 = 9 units 301 - 330 = 10 units 331 - 360 = 11 units 361 - 400 = 12 units     [provider]  Insulin Detemir (LEVEMIR FLEXPEN) 100 UNIT/ML Pen Inject 12 Units into the skin 2 (two) times daily. 05/18/17   Roxan Hockey, MD  Insulin Pen Needle (B-D UF III MINI PEN NEEDLES) 31G X 5 MM MISC Use to inject insulin twice daily. 07/02/17   [name], MD  ondansetron (ZOFRAN-ODT) 8 MG disintegrating tablet Take 1 tablet (8 mg total) by mouth every 8 (eight) hours as needed for nausea or vomiting. 07/02/17   [name], MD  pantoprazole (PROTONIX) 40 MG tablet Take 1 tablet (40 mg total) by mouth daily. 07/02/17   [name], MD  polyethylene glycol (MIRALAX / GLYCOLAX) packet Take 17 g by  mouth daily as needed for mild constipation.  04/11/17   [provider]  pregabalin (LYRICA) 150 MG capsule Take 1 capsule (150 mg total) by mouth daily. 07/02/17   [name], MD    Family History Family History  Problem Relation Age of Onset  . Cystic fibrosis Mother   . Hypertension Father   . Diabetes Brother   . Hypertension Maternal Grandmother     Social History Social History   Tobacco Use  . Smoking status: Never Smoker  . Smokeless tobacco: Never Used  Substance Use Topics  . Alcohol use: No  . Drug use: No     Allergies   Anesthetics, amide; Penicillins; Buprenorphine hcl; Encainide; and Metoclopramide   Review of Systems Review of Systems   Physical Exam Updated Vital Signs BP (!) 156/70   Pulse (!) 109   Resp (!) 37   Physical Exam   ED Treatments / Results  Labs (all labs ordered are listed, but only abnormal results are displayed) Labs Reviewed  BLOOD GAS, VENOUS - Abnormal; Notable for the following components:      Result Value   pCO2, Ven 31.0 (*)    pO2, Ven 53.2 (*)    Bicarbonate 14.4 (*)    Acid-base deficit 10.8 (*)    All other components within normal limits  CBG MONITORING, ED - Abnormal; Notable for the following components:   Glucose-Capillary 504 (*)    All other components within normal limits  I-STAT CHEM 8, ED - Abnormal; Notable for the following components:   Potassium 5.6 (*)    BUN 48 (*)    Creatinine, Ser 2.20 (*)    Glucose, Bld 547 (*)    TCO2 17 (*)    All other components within normal limits  CBC WITH DIFFERENTIAL/PLATELET  BASIC METABOLIC PANEL  URINALYSIS, ROUTINE W REFLEX MICROSCOPIC  I-STAT CHEM 8, ED    EKG  EKG Interpretation None       Radiology No results found.  Procedures IO LINE INSERTION Date/Time: 07/10/2017 12:54 AM Performed by: Sherwood Gambler, MD Authorized by: Sherwood Gambler, MD   Consent:    Consent obtained:  Verbal   Consent given by:   Patient Pre-procedure details:    Site preparation:  Alcohol   Preparation: Patient was prepped and draped in usual sterile fashion   Procedure details:    Insertion site:  R proximal tibia   Insertion device:  15 gauge IO needle   Insertion: Needle was inserted through the bony cortex     Number of attempts:  1   Insertion confirmation:  Aspiration of blood/marrow, easy infusion of fluids  and stability of the needle Post-procedure details:    Secured with:  Elastic bandage   Patient tolerance of procedure:  Tolerated well, no immediate complications Comments:     76mL cardiac lidocaine infused for patient comfort/relieve pain of infusion .Critical Care Performed by: Sherwood Gambler, MD Authorized by: Sherwood Gambler, MD   Critical care provider statement:    Critical care time (minutes):  30   Critical care time was exclusive of:  Separately billable procedures and treating other patients   Critical care was necessary to treat or prevent imminent or life-threatening deterioration of the following conditions:  Endocrine crisis   Critical care was time spent personally by me on the following activities:  Blood draw for specimens, development of treatment plan with patient or surrogate, vascular access procedures, obtaining history from patient or surrogate, examination of patient, ordering and performing treatments and interventions, ordering and review of laboratory studies, ordering and review of radiographic studies, re-evaluation of patient's condition, pulse oximetry and review of old charts   (including critical care time)  Medications Ordered in ED Medications  sodium chloride 0.9 % bolus 1,000 mL (not administered)  ondansetron (ZOFRAN) injection 4 mg (not administered)  LORazepam (ATIVAN) injection 1 mg (not administered)  lidocaine (cardiac) 100 mg/5ml (XYLOCAINE) 20 MG/ML injection 2% 3 mL (not administered)  insulin regular (NOVOLIN R,HUMULIN R) 100 Units in sodium chloride  0.9 % 100 mL (1 Units/mL) infusion (not administered)  sodium chloride 0.9 % bolus 1,000 mL (not administered)    And  sodium chloride 0.9 % bolus 1,000 mL (not administered)    And  0.9 %  sodium chloride infusion (not administered)  dextrose 5 %-0.45 % sodium chloride infusion (not administered)     Initial Impression / Assessment and Plan / ED Course  I have reviewed the triage vital signs and the nursing notes.  Pertinent labs & imaging results that were available during my care of the patient were reviewed by me and considered in my medical decision making (see chart for details).    Patient presents with recurrent DKA.  She is tachypneic, mildly tachycardic, though her blood pressure is stable.  Very difficult to get IV access including ultrasound IVs or EJ.  Thus IO performed as above.  She will be given IV fluids and started on insulin and will need admission to the hospitalist service.   Final Clinical Impressions(s) / ED Diagnoses   Final diagnoses:  None    ED Discharge Orders    None       Sherwood Gambler, MD 07/10/17 [id number]

## 2017-07-10 NOTE — Care Management Note (Signed)
Case Management Note  Patient Details  Name: [name] MRN: [id number] Date of Birth: 1965-07-22  Subjective/Objective:                  dka  Action/Plan: Date: July 10, 2017 Velva Harman, BSN, [place], [place] Chart and notes review for patient progress and needs. Will follow for case management and discharge needs. No cm or discharge needs present at time of this review. Next review date: [id number]  Expected Discharge Date:                  Expected Discharge Plan:  Home/Self Care  In-House Referral:     Discharge planning Services  CM Consult  Post Acute Care Choice:    Choice offered to:     DME Arranged:    DME Agency:     HH Arranged:    HH Agency:     Status of Service:  In process, will continue to follow  If discussed at Long Length of Stay Meetings, dates discussed:    Additional Comments:  Leeroy Cha, RN 07/10/2017, 8:47 AM

## 2017-07-10 NOTE — H&P (Signed)
Patient Demographics:    [name], is a 52 y.o. female  MRN: [id number]   DOB - 11-Dec-1965  Admit Date - 07/09/2017  Outpatient Primary MD for the patient is [name], MD   Assessment & Plan:    Principal Problem:   DKA, type 1 ([place]) Active Problems:   Essential hypertension   Noncompliance with treatment plan   DKA (diabetic ketoacidoses) ([place])    1)DKA-due to noncompliance, patient admitted with blood sugars over 540, anion gap of 19, bicarb of 17 (Repeat Bicarb down to 15), IV insulin drip per protocol, serial BMP and CBGs per protocol, will change IV fluids from normal saline to D5 when blood sugars drop below 250, initial potassium elevated at 5.6, this should improve with better glycemic control  2)Depression/Anxiety-patient has severe depression and anxiety which apparently impacts her ability to be compliant with both psychiatric medications and insulin regimen. D/w Pt's Sister Ms Donato Schultz who lives with patient, patient will need further outpatient psychiatric evaluation, inpatient psychiatric consult requested.  Patient denies suicidal or homicidal ideation or plan, she does admit to anhedonia,  anorexia and depressed mood, improved psychiatric treatment will enhance her compliance with her diabetic.  Diabetic educator consult requested  3)Diabetic Gastroparesis-patient apparently is intolerant to Reglan, erythromycin and Zofran as ordered  4)AKI- POA, creatinine is up to 2.0 from a baseline of 1, this should improve with hydration  5)Social/Ethics-patient is a full code, patient has had at least 11 admissions in the last 7 months, at least  9 of those admissions for DKA and 2 for gastroparesis  6)Disposition-upon discharge home home health RN for improved medication compliance may be  helpful  With History of - Reviewed by me  Past Medical History:  Diagnosis Date  . Depression   . Diabetes mellitus without complication ([place])   . Essential hypertension   . Gastroparesis   . GERD (gastroesophageal reflux disease)   . HLD (hyperlipidemia)       Past Surgical History:  Procedure Laterality Date  . COLONOSCOPY  09/27/2014   at [facility]  . ESOPHAGOGASTRODUODENOSCOPY  09/27/2014   at [facility], Dr Rolan Lipa. biospy neg for celiac, neg for H pylori.   . EYE SURGERY    . gailstones    . IR FLUORO GUIDE CV LINE RIGHT  02/01/2017  . IR FLUORO GUIDE CV LINE RIGHT  03/06/2017  . IR FLUORO GUIDE CV LINE RIGHT  03/25/2017  . IR GENERIC HISTORICAL  01/24/2016   IR FLUORO GUIDE CV LINE RIGHT 01/24/2016 Darrell K Allred, PA-C [facility]  . IR GENERIC HISTORICAL  01/24/2016   IR US GUIDE VASC ACCESS RIGHT 01/24/2016 Darrell K Allred, PA-C [facility]  . IR US GUIDE VASC ACCESS RIGHT  02/01/2017  . IR US GUIDE VASC ACCESS RIGHT  03/06/2017  . IR US GUIDE VASC ACCESS RIGHT  03/25/2017  . POSTERIOR VITRECTOMY AND MEMBRANE PEEL-LEFT EYE  09/28/2002  . POSTERIOR  VITRECTOMY AND MEMBRANE PEEL-RIGHT EYE  03/16/2002  . RETINAL DETACHMENT SURGERY        Chief Complaint  Patient presents with  . Hyperglycemia  . Nausea  . Weakness      HPI:    [name]  is a 52 y.o. female with PMH of DM1 on insulin, gastroparesis, GERD, HTN, and HL, severe anxiety and depression who presents to the ED with a chief complaint of hyperglycemia, nausea, and vomiting.  She states that the symptoms started 2 days ago and have gradually worsened.  She denies any fever, chills, or cough.  She has a hx of recurrent DKA. patient has had at least 11 admissions in the last 7 months, at least  9 of those admissions for DKA and 2 for gastroparesis  IO placed in Rt Tibia in ED due to poor IV access, PICC Line placement requested  In ED CBG noted to > 540, bicarb 17 , with anion gap 19, and AK I with creatinine  noted to be 2.2 (baseline creatinine around 1).    Denies diarrhea, as per patient emesis is without bile or blood  patient has severe depression and anxiety which apparently impacts her ability to be compliant with both psychiatric medications and insulin regimen. D/w Pt's Sister Ms Donato Schultz who lives with patient,   Patient denies suicidal or homicidal ideation or plan, she does admit to anhedonia,  anorexia and depressed mood.  No chest pain, no palpitations, no dizziness, no pleuritic symptoms,      Review of systems:    In addition to the HPI above,   A full 12 point Review of 10 Systems was done, except as stated above, all other Review of 10 Systems were negative.    Social History:  Reviewed by me    Social History   Tobacco Use  . Smoking status: Never Smoker  . Smokeless tobacco: Never Used  Substance Use Topics  . Alcohol use: No       Family History :  Reviewed by me    Family History  Problem Relation Age of Onset  . Cystic fibrosis Mother   . Hypertension Father   . Diabetes Brother   . Hypertension Maternal Grandmother     Home Medications:   Prior to Admission medications   Medication Sig Start Date End Date Taking? Authorizing Provider  benztropine (COGENTIN) 1 MG tablet Take 1 tablet (1 mg total) by mouth 2 (two) times daily. 04/03/17   Dessa Phi, DO  busPIRone (BUSPAR) 10 MG tablet Take 1 tablet (10 mg total) by mouth 3 (three) times daily. 07/02/17   [name], MD  clonazePAM (KLONOPIN) 0.5 MG disintegrating tablet Take 1 tablet (0.5 mg total) by mouth 2 (two) times daily. 04/15/17   Gerlene Fee, NP  erythromycin base (E-MYCIN) 500 MG tablet Take 0.5 tablets (250 mg total) by mouth 3 (three) times daily after meals. 07/02/17 07/02/18  [name], MD  insulin aspart (NOVOLOG FLEXPEN) 100 UNIT/ML FlexPen 6 Units. Inject as per sliding scale subcutaneously before meals and at bedtime 70 - 119 = 0 units 120 - 150 = 2 units 151 -  180 = 4 units 181 - 210 = 5 units Greater than 400 notify MD 211 - 240 = 6 units 241 - 270 = 8 units 271 - 300 = 9 units 301 - 330 = 10 units 331 - 360 = 11 units 361 - 400 = 12 units     [provider]  Insulin Detemir (LEVEMIR FLEXPEN) 100 UNIT/ML Pen Inject 12 Units into the skin 2 (two) times daily. 05/18/17   Roxan Hockey, MD  Insulin Pen Needle (B-D UF III MINI PEN NEEDLES) 31G X 5 MM MISC Use to inject insulin twice daily. 07/02/17   [name], MD  ondansetron (ZOFRAN-ODT) 8 MG disintegrating tablet Take 1 tablet (8 mg total) by mouth every 8 (eight) hours as needed for nausea or vomiting. 07/02/17   [name], MD  pantoprazole (PROTONIX) 40 MG tablet Take 1 tablet (40 mg total) by mouth daily. 07/02/17   [name], MD  polyethylene glycol (MIRALAX / GLYCOLAX) packet Take 17 g by mouth daily as needed for mild constipation.  04/11/17   [provider]  pregabalin (LYRICA) 150 MG capsule Take 1 capsule (150 mg total) by mouth daily. 07/02/17   [name], MD     Allergies:     Allergies  Allergen Reactions  . Anesthetics, Amide Nausea And Vomiting  . Penicillins Diarrhea and Nausea And Vomiting    Has patient had a PCN reaction causing immediate rash, facial/tongue/throat swelling, SOB or lightheadedness with hypotension: Yes Has patient had a PCN reaction causing severe rash involving mucus membranes or skin necrosis: No Has patient had a PCN reaction that required hospitalization No Has patient had a PCN reaction occurring within the last 10 years: Yes  If all of the above answers are "NO", then may proceed with Cephalosporin use.   . Buprenorphine Hcl Rash  . Encainide Nausea And Vomiting  . Metoclopramide Other (See Comments)    Dystonia, muscle rigidity.      Physical Exam:   Vitals  Blood pressure (!) 163/79, pulse (!) 114, temperature 98 F (36.7 C), temperature source Oral, resp. rate 17, height 5\' 2"  (1.575 m), weight 54.1 kg (119  lb 4.3 oz), SpO2 100 %.  Physical Examination: General appearance - alert, flat to depressed affect, and in no distress and  Mental status - alert, oriented to person, place, and time, patient is not really making eye contact, flat to depressed affect, Eyes - sclera anicteric Neck - supple, no JVD elevation , Chest - clear  to auscultation bilaterally, symmetrical air movement,  Heart - S1 and S2 normal,  Abdomen - soft, nontender, nondistended, no masses or organomegaly Neurological - flat to depressed affect, neck supple without rigidity, cranial nerves II through XII intact, DTR's normal and symmetric Extremities - no pedal edema noted, intact peripheral pulses , Rt Tibia IO site is dry and intact Skin - warm, dry    Data Review:    CBC Recent Labs  Lab 07/10/17 0011  HGB 13.9  HCT 41.0   ------------------------------------------------------------------------------------------------------------------  Chemistries  Recent Labs  Lab 07/10/17 0011 07/10/17 0045 07/10/17 0741  NA 139 139 145  K 5.6* 5.0 4.7  CL 106 103 122*  CO2  --  17* 15*  GLUCOSE 547* 528* 223*  BUN 48* 38* 31*  CREATININE 2.20* 2.25* 1.62*  CALCIUM  --  9.7 8.4*   ------------------------------------------------------------------------------------------------------------------ estimated creatinine clearance is 32.9 mL/min (A) (by C-G formula based on SCr of 1.62 mg/dL (H)). ------------------------------------------------------------------------------------------------------------------ No results for input(s): TSH, T4TOTAL, T3FREE, THYROIDAB in the last 72 hours.  Invalid input(s): FREET3   Coagulation profile No results for input(s): INR, PROTIME in the last 168 hours. ------------------------------------------------------------------------------------------------------------------- No results for input(s): DDIMER in the last 72  hours. -------------------------------------------------------------------------------------------------------------------  Cardiac Enzymes No results for input(s): CKMB, TROPONINI, MYOGLOBIN in the last 168  hours.  Invalid input(s): CK ------------------------------------------------------------------------------------------------------------------ No results found for: BNP   ---------------------------------------------------------------------------------------------------------------  Urinalysis    Component Value Date/Time   COLORURINE YELLOW 07/10/2017 0400   APPEARANCEUR CLEAR 07/10/2017 0400   LABSPEC 1.021 07/10/2017 0400   PHURINE 5.0 07/10/2017 0400   GLUCOSEU >=500 (A) 07/10/2017 0400   HGBUR MODERATE (A) 07/10/2017 0400   BILIRUBINUR NEGATIVE 07/10/2017 0400   BILIRUBINUR neg 01/17/2017 1533   KETONESUR 80 (A) 07/10/2017 0400   PROTEINUR 30 (A) 07/10/2017 0400   UROBILINOGEN 0.2 01/17/2017 1533   UROBILINOGEN 0.2 04/27/2015 1608   NITRITE NEGATIVE 07/10/2017 0400   LEUKOCYTESUR NEGATIVE 07/10/2017 0400    ----------------------------------------------------------------------------------------------------------------   Imaging Results:    No results found.  Radiological Exams on Admission: No results found.  DVT Prophylaxis -SCD  /heparin AM Labs Ordered, also please review Full Orders  Family Communication: Admission, patients condition and plan of care including tests being ordered have been discussed with the patient and sister who indicate understanding and agree with the plan   Code Status - Full Code  Likely DC to  Home with home health in a couple of days  Condition   Stable  Roxan Hockey M.D on 07/10/2017 at 11:25 AM   Between 7am to 7pm - Pager - [phone] After 7pm go to [email or web address] - password [email or web address]  [facility] - Office  [phone]  Voice Recognition Viviann Spare dictation system was used to create this note, attempts have  been made to correct errors. Please contact the author with questions and/or clarifications.

## 2017-07-10 NOTE — ED Provider Notes (Signed)
[facility] DEPT Provider Note   CSN: [id number] Arrival date & time: 07/09/17  2130     History   Chief Complaint Chief Complaint  Patient presents with  . Hyperglycemia  . Nausea  . Weakness    HPI [name] is a 52 y.o. female.  Patient with hx of DM1 on insulin, gastroparesis, GERD, HTN, and HL, presents to the ED with a chief complaint of hyperglycemia, nausea, and vomiting.  She states that the symptoms started 2 days ago and have gradually worsened.  She denies any fever, chills, or cough.  She has a hx of recurrent DKA.  She denies changes in her insulin use.  Denies any recent illness.     The history is provided by the patient. No language interpreter was used.    Past Medical History:  Diagnosis Date  . Depression   . Diabetes mellitus without complication ([place])   . Essential hypertension   . Gastroparesis   . GERD (gastroesophageal reflux disease)   . HLD (hyperlipidemia)     Patient Active Problem List   Diagnosis Date Noted  . DKA (diabetic ketoacidoses) ([place]) 06/16/2017  . Severe protein-calorie malnutrition ([place]) 04/17/2017  . Acute dystonic reaction due to drugs 04/01/2017  . Serotonin syndrome 03/25/2017  . Tardive dyskinesia   . Tachycardia 03/16/2017  . Diarrhea 03/05/2017  . DKA, type 1 ([place]) 03/18/2016  . Noncompliance with treatment plan 03/13/2016  . Essential hypertension 01/24/2016  . Abnormal ECG 11/27/2015  . HLD (hyperlipidemia)   . GERD (gastroesophageal reflux disease)   . Anemia, iron deficiency   . Vitamin B12 deficiency 08/16/2015  . Diabetic gastroparesis ([place])   . Diabetic neuropathy, type I diabetes mellitus ([place]) 05/18/2015  . Anxiety and depression 05/18/2015    Past Surgical History:  Procedure Laterality Date  . COLONOSCOPY  09/27/2014   at [facility]  . ESOPHAGOGASTRODUODENOSCOPY  09/27/2014   at [facility], Dr Rolan Lipa. biospy neg for celiac, neg for H pylori.   . EYE SURGERY    .  gailstones    . IR FLUORO GUIDE CV LINE RIGHT  02/01/2017  . IR FLUORO GUIDE CV LINE RIGHT  03/06/2017  . IR FLUORO GUIDE CV LINE RIGHT  03/25/2017  . IR GENERIC HISTORICAL  01/24/2016   IR FLUORO GUIDE CV LINE RIGHT 01/24/2016 Darrell K Allred, PA-C [facility]  . IR GENERIC HISTORICAL  01/24/2016   IR US GUIDE VASC ACCESS RIGHT 01/24/2016 Darrell K Allred, PA-C [facility]  . IR US GUIDE VASC ACCESS RIGHT  02/01/2017  . IR US GUIDE VASC ACCESS RIGHT  03/06/2017  . IR US GUIDE VASC ACCESS RIGHT  03/25/2017  . POSTERIOR VITRECTOMY AND MEMBRANE PEEL-LEFT EYE  09/28/2002  . POSTERIOR VITRECTOMY AND MEMBRANE PEEL-RIGHT EYE  03/16/2002  . RETINAL DETACHMENT SURGERY      OB History    No data available       Home Medications    Prior to Admission medications   Medication Sig Start Date End Date Taking? Authorizing Provider  benztropine (COGENTIN) 1 MG tablet Take 1 tablet (1 mg total) by mouth 2 (two) times daily. 04/03/17   Dessa Phi, DO  busPIRone (BUSPAR) 10 MG tablet Take 1 tablet (10 mg total) by mouth 3 (three) times daily. 07/02/17   [name], MD  clonazePAM (KLONOPIN) 0.5 MG disintegrating tablet Take 1 tablet (0.5 mg total) by mouth 2 (two) times daily. 04/15/17   Gerlene Fee, NP  erythromycin base (  E-MYCIN) 500 MG tablet Take 0.5 tablets (250 mg total) by mouth 3 (three) times daily after meals. 07/02/17 07/02/18  [name], MD  insulin aspart (NOVOLOG FLEXPEN) 100 UNIT/ML FlexPen 6 Units. Inject as per sliding scale subcutaneously before meals and at bedtime 70 - 119 = 0 units 120 - 150 = 2 units 151 - 180 = 4 units 181 - 210 = 5 units Greater than 400 notify MD 211 - 240 = 6 units 241 - 270 = 8 units 271 - 300 = 9 units 301 - 330 = 10 units 331 - 360 = 11 units 361 - 400 = 12 units     [provider]  Insulin Detemir (LEVEMIR FLEXPEN) 100 UNIT/ML Pen Inject 12 Units into the skin 2 (two) times daily. 05/18/17   Roxan Hockey, MD  Insulin Pen Needle (B-D  UF III MINI PEN NEEDLES) 31G X 5 MM MISC Use to inject insulin twice daily. 07/02/17   [name], MD  ondansetron (ZOFRAN-ODT) 8 MG disintegrating tablet Take 1 tablet (8 mg total) by mouth every 8 (eight) hours as needed for nausea or vomiting. 07/02/17   [name], MD  pantoprazole (PROTONIX) 40 MG tablet Take 1 tablet (40 mg total) by mouth daily. 07/02/17   [name], MD  polyethylene glycol (MIRALAX / GLYCOLAX) packet Take 17 g by mouth daily as needed for mild constipation.  04/11/17   [provider]  pregabalin (LYRICA) 150 MG capsule Take 1 capsule (150 mg total) by mouth daily. 07/02/17   [name], MD    Family History Family History  Problem Relation Age of Onset  . Cystic fibrosis Mother   . Hypertension Father   . Diabetes Brother   . Hypertension Maternal Grandmother     Social History Social History   Tobacco Use  . Smoking status: Never Smoker  . Smokeless tobacco: Never Used  Substance Use Topics  . Alcohol use: No  . Drug use: No     Allergies   Anesthetics, amide; Penicillins; Buprenorphine hcl; Encainide; and Metoclopramide   Review of Systems Review of Systems  All other systems reviewed and are negative.    Physical Exam Updated Vital Signs BP (!) 156/70   Pulse (!) 109   Resp (!) 37   Physical Exam  Constitutional: She is oriented to person, place, and time. She appears well-developed and well-nourished.  HENT:  Head: Normocephalic and atraumatic.  Eyes: Conjunctivae and EOM are normal. Pupils are equal, round, and reactive to light.  Neck: Normal range of motion. Neck supple.  Cardiovascular: Regular rhythm. Exam reveals no gallop and no friction rub.  No murmur heard. tachycardic  Pulmonary/Chest: Effort normal and breath sounds normal. No respiratory distress. She has no wheezes. She has no rales. She exhibits no tenderness.  tachypneic kussaul breathing  Abdominal: Soft. Bowel sounds are normal. She exhibits no  distension and no mass. There is no tenderness. There is no rebound and no guarding.  Musculoskeletal: Normal range of motion. She exhibits no edema or tenderness.  Neurological: She is alert and oriented to person, place, and time.  Skin: Skin is warm and dry.  Psychiatric: She has a normal mood and affect. Her behavior is normal. Judgment and thought content normal.  Nursing note and vitals reviewed.    ED Treatments / Results  Labs (all labs ordered are listed, but only abnormal results are displayed) Labs Reviewed  BLOOD GAS, VENOUS - Abnormal; Notable for the following components:  Result Value   pCO2, Ven 31.0 (*)    pO2, Ven 53.2 (*)    Bicarbonate 14.4 (*)    Acid-base deficit 10.8 (*)    All other components within normal limits  CBG MONITORING, ED - Abnormal; Notable for the following components:   Glucose-Capillary 504 (*)    All other components within normal limits  I-STAT CHEM 8, ED - Abnormal; Notable for the following components:   Potassium 5.6 (*)    BUN 48 (*)    Creatinine, Ser 2.20 (*)    Glucose, Bld 547 (*)    TCO2 17 (*)    All other components within normal limits  CBC WITH DIFFERENTIAL/PLATELET  BASIC METABOLIC PANEL  URINALYSIS, ROUTINE W REFLEX MICROSCOPIC  I-STAT CHEM 8, ED    EKG  EKG Interpretation None       Radiology No results found.  Procedures Procedures (including critical care time) CRITICAL CARE Performed by: Montine Circle   Total critical care time: 58 minutes  Critical care time was exclusive of separately billable procedures and treating other patients.  Critical care was necessary to treat or prevent imminent or life-threatening deterioration.  Critical care was time spent personally by me on the following activities: development of treatment plan with patient and/or surrogate as well as nursing, discussions with consultants, evaluation of patient's response to treatment, examination of patient, obtaining  history from patient or surrogate, ordering and performing treatments and interventions, ordering and review of laboratory studies, ordering and review of radiographic studies, pulse oximetry and re-evaluation of patient's condition.  Medications Ordered in ED Medications  insulin regular (NOVOLIN R,HUMULIN R) 100 Units in sodium chloride 0.9 % 100 mL (1 Units/mL) infusion (4.9 Units/hr Intravenous New Bag/Given 07/10/17 0141)  sodium chloride 0.9 % bolus 1,000 mL (not administered)    And  sodium chloride 0.9 % bolus 1,000 mL (1,000 mLs Intravenous New Bag/Given 07/10/17 0236)    And  0.9 %  sodium chloride infusion (not administered)  dextrose 5 %-0.45 % sodium chloride infusion (not administered)  sodium chloride 0.9 % bolus 1,000 mL (1,000 mLs Intravenous New Bag/Given 07/10/17 0054)  ondansetron (ZOFRAN) injection 4 mg (4 mg Intravenous Given 07/10/17 0117)  LORazepam (ATIVAN) injection 1 mg (1 mg Intravenous Given 07/10/17 0117)  lidocaine (cardiac) 100 mg/42ml (XYLOCAINE) 20 MG/ML injection 2% 3 mL (3 mLs Intravenous Given by Other 07/10/17 0054)     Initial Impression / Assessment and Plan / ED Course  I have reviewed the triage vital signs and the nursing notes.  Pertinent labs & imaging results that were available during my care of the patient were reviewed by me and considered in my medical decision making (see chart for details).     Patient with hyperglycemia.  Physical exam is concerning for DKA.  Hx of the same.  Will check labs, give fluids, and give insulin once labs are back.   12:51 AM There was considerable delay in treatment due to extreme difficulty in obtaining access.  Multiple RNs, IV team, and providers attempted to obtain access.  Ultimately, the decision was made to place an IO by Dr. Regenia Skeeter.  Notified that the IO was running slow.  I discussed this with Dr. Stark Jock and we discussed options as far as additional access.  Dr. Stark Jock recommends consultation with critical  care to ask about central line.  Spoke with Dr. Oletta Darter, who states that patient can continue fluids and insulin through IO, unless she becomes hypotensive or glucose doesn't start  to trend down.  CBG trending down.  RN able to get additional access in wrist.  Appreciate Dr. Tamala Julian for admitting the patient.   Final Clinical Impressions(s) / ED Diagnoses   Final diagnoses:  Diabetic ketoacidosis without coma associated with type 1 diabetes mellitus [facility])    ED Discharge Orders    None       Montine Circle, PA-C 07/10/17 0321    Sherwood Gambler, MD 07/11/17 [phone]

## 2017-07-11 ENCOUNTER — Telehealth: Payer: Self-pay | Admitting: Family Medicine

## 2017-07-11 ENCOUNTER — Other Ambulatory Visit: Payer: Self-pay

## 2017-07-11 DIAGNOSIS — F32A Depression, unspecified: Secondary | ICD-10-CM

## 2017-07-11 DIAGNOSIS — F419 Anxiety disorder, unspecified: Principal | ICD-10-CM

## 2017-07-11 DIAGNOSIS — F329 Major depressive disorder, single episode, unspecified: Secondary | ICD-10-CM

## 2017-07-11 LAB — CBC
HEMATOCRIT: 34.2 % — AB (ref 36.0–46.0)
HEMOGLOBIN: 11.5 g/dL — AB (ref 12.0–15.0)
MCH: 27.9 pg (ref 26.0–34.0)
MCHC: 33.6 g/dL (ref 30.0–36.0)
MCV: 83 fL (ref 78.0–100.0)
Platelets: 275 10*3/uL (ref 150–400)
RBC: 4.12 MIL/uL (ref 3.87–5.11)
RDW: 16.5 % — ABNORMAL HIGH (ref 11.5–15.5)
WBC: 9.2 10*3/uL (ref 4.0–10.5)

## 2017-07-11 LAB — GLUCOSE, CAPILLARY
GLUCOSE-CAPILLARY: 108 mg/dL — AB (ref 65–99)
GLUCOSE-CAPILLARY: 128 mg/dL — AB (ref 65–99)
GLUCOSE-CAPILLARY: 129 mg/dL — AB (ref 65–99)
GLUCOSE-CAPILLARY: 129 mg/dL — AB (ref 65–99)
GLUCOSE-CAPILLARY: 134 mg/dL — AB (ref 65–99)
GLUCOSE-CAPILLARY: 140 mg/dL — AB (ref 65–99)
GLUCOSE-CAPILLARY: 148 mg/dL — AB (ref 65–99)
GLUCOSE-CAPILLARY: 153 mg/dL — AB (ref 65–99)
GLUCOSE-CAPILLARY: 98 mg/dL (ref 65–99)
Glucose-Capillary: 158 mg/dL — ABNORMAL HIGH (ref 65–99)
Glucose-Capillary: 113 mg/dL — ABNORMAL HIGH (ref 65–99)
Glucose-Capillary: 123 mg/dL — ABNORMAL HIGH (ref 65–99)
Glucose-Capillary: 145 mg/dL — ABNORMAL HIGH (ref 65–99)
Glucose-Capillary: 160 mg/dL — ABNORMAL HIGH (ref 65–99)
Glucose-Capillary: 146 mg/dL — ABNORMAL HIGH (ref 65–99)
Glucose-Capillary: 128 mg/dL — ABNORMAL HIGH (ref 65–99)
Glucose-Capillary: 73 mg/dL (ref 65–99)
Glucose-Capillary: 102 mg/dL — ABNORMAL HIGH (ref 65–99)

## 2017-07-11 LAB — URINE CULTURE: CULTURE: NO GROWTH

## 2017-07-11 LAB — BASIC METABOLIC PANEL
ANION GAP: 5 (ref 5–15)
BUN: 13 mg/dL (ref 6–20)
CALCIUM: 7.9 mg/dL — AB (ref 8.9–10.3)
CHLORIDE: 114 mmol/L — AB (ref 101–111)
CO2: 19 mmol/L — ABNORMAL LOW (ref 22–32)
Creatinine, Ser: 0.95 mg/dL (ref 0.44–1.00)
GFR calc Af Amer: >60 mL/min (ref >60)
GFR calc non Af Amer: >60 mL/min (ref >60)
GLUCOSE: 161 mg/dL — AB (ref 65–99)
POTASSIUM: 3.7 mmol/L (ref 3.5–5.1)
Sodium: 138 mmol/L (ref 135–145)

## 2017-07-11 MED ORDER — BUSPIRONE HCL 10 MG PO TABS: 10.0000 mg | ORAL_TABLET | Freq: Three times a day (TID) | ORAL | 2 refills | Status: DC

## 2017-07-11 MED ORDER — INSULIN DETEMIR 100 UNIT/ML ~~LOC~~ SOLN
12.0000 [IU] | Freq: Every day | SUBCUTANEOUS | Status: DC
  Administered 2017-07-11 – 2017-07-12 (×2): 12 [IU] via SUBCUTANEOUS
  Filled 2017-07-11 (×2): qty 0.12

## 2017-07-11 MED ORDER — BISACODYL 10 MG RE SUPP
10.0000 mg | Freq: Once | RECTAL | Status: AC
  Administered 2017-07-11: 10 mg via RECTAL
  Filled 2017-07-11: qty 1

## 2017-07-11 MED ORDER — INSULIN ASPART 100 UNIT/ML ~~LOC~~ SOLN: 0.0000 [IU] | SUBCUTANEOUS | Status: DC

## 2017-07-11 MED ORDER — INSULIN DETEMIR 100 UNIT/ML ~~LOC~~ SOLN: 12.0000 [IU] | Freq: Once | SUBCUTANEOUS | Status: DC

## 2017-07-11 MED ORDER — INSULIN ASPART 100 UNIT/ML ~~LOC~~ SOLN
0.0000 [IU] | SUBCUTANEOUS | Status: DC
  Administered 2017-07-12: 12 [IU] via SUBCUTANEOUS
  Administered 2017-07-12: 4 [IU] via SUBCUTANEOUS

## 2017-07-11 MED ORDER — INSULIN ASPART 100 UNIT/ML ~~LOC~~ SOLN
0.0000 [IU] | SUBCUTANEOUS | Status: DC
Start: 2017-07-11 — End: 2017-07-11

## 2017-07-11 MED FILL — busPIRone HCL 10 MG TABS: 10 | 30 days supply | Qty: 90 | Fill #0

## 2017-07-11 NOTE — Progress Notes (Signed)
PROGRESS NOTE    [name]  WRU:[id number] DOB: 08-01-1965 DOA: 07/09/2017 PCP: [name], MD   Brief Narrative: 52 y.o. female with PMH of DM1 on insulin, gastroparesis, GERD, HTN, and HL, severe anxiety and depression who presents to the ED with a chief complaint of hyperglycemia, nausea, and vomiting. She states that the symptoms started 2 days ago and have gradually worsened. She denies any fever, chills, or cough. She has a hx of recurrent DKA. patient has had at least 11 admissions in the last 7 months, at least  9 of those admissions for DKA and 2 for gastroparesis  IO placed in Rt Tibia in ED due to poor IV access, PICC Line placement requested  In ED CBG noted to > 540, bicarb 17 , with anion gap 19, and AK I with creatinine noted to be 2.2 (baseline creatinine around 1).  Denies diarrhea, as per patient emesis is without bile or blood  patient has severe depression and anxiety which apparently impacts her ability to be compliant with both psychiatric medications and insulin regimen. D/w Pt's Sister Ms Donato Schultz who lives with patient,   Patient denies suicidal or homicidal ideation or plan, she does admit to anhedonia,  anorexia and depressed mood.  No chest pain, no palpitations, no dizziness, no pleuritic symptoms,     Assessment & Plan:   Principal Problem:   DKA, type 1 (HCC) Active Problems:   Essential hypertension   Noncompliance with treatment plan   DKA (diabetic ketoacidoses) ([place]) 1)DKA-due to noncompliance, patient admitted with blood sugars over 540, anion gap of 19, bicarb of 17 (Repeat Bicarb down to 15). Treated with  IV insulin drip per protocol, serial BMP and CBGs per protocol, will change IV fluids from normal saline to D5 when blood sugars drop below 250, initial potassium elevated at 5.6, this should improve with better glycemic control.  Blood sugar this morning is 141 with a gap of 5.  Insulin drip has been stopped her home  regimen has been restarted.  2)Depression/Anxiety-patient has severe depression and anxiety which apparently impacts her ability to be compliant with both psychiatric medications and insulin regimen. D/w Pt's Sister Ms Donato Schultz who lives with patient, patient will need further outpatient psychiatric evaluation, inpatient psychiatric consult requested.  Patient denies suicidal or homicidal ideation or plan, she does admit to anhedonia,  anorexia and depressed mood, improved psychiatric treatment will enhance her compliance with her diabetic.  Diabetic educator consult requested  3)Diabetic Gastroparesis-patient apparently is intolerant to Reglan, erythromycin and Zofran as ordered  4)AKI- POA, creatinine is up to 2.0 from a baseline of 1, it has decreased to 0.95 today.5)Social/Ethics-patient is a full code, patient has had at least 11 admissions in the last 7 months, at least  9 of those admissions for DKA and 2 for gastroparesis  6)Disposition-upon discharge home home health RN for improved medication compliance  7] constipation will treat with stool softeners and laxatives.      DVT prophylaxis: Lovenox Code Status: Full code  Family Communication:1)DKA-due to noncompliance, patient admitted with blood sugars over 540, anion gap of 19, bicarb of 17 (Repeat Bicarb down to 15), IV insulin drip per protocol, serial BMP and CBGs per protocol, will change IV fluids from normal saline to D5 when blood sugars drop below 250, initial potassium elevated at 5.6, this should improve with better glycemic control  2)Depression/Anxiety-patient has severe depression and anxiety which apparently impacts her ability to be compliant with both psychiatric medications  and insulin regimen. D/w Pt's Sister Ms Donato Schultz who lives with patient, patient will need further outpatient psychiatric evaluation, inpatient psychiatric consult requested.  Patient denies suicidal or homicidal ideation or plan, she  does admit to anhedonia,  anorexia and depressed mood, improved psychiatric treatment will enhance her compliance with her diabetic.  Diabetic educator consult requested  3)Diabetic Gastroparesis-patient apparently is intolerant to Reglan, erythromycin and Zofran as ordered  4)AKI- POA, creatinine is up to 2.0 from a baseline of 1, this should improve with hydration  5)Social/Ethics-patient is a full code, patient has had at least 11 admissions in the last 7 months, at least  9 of those admissions for DKA and 2 for gastroparesis  6)Disposition-upon discharge home home health RN for improved medication compliance    Disposition Plan: Plan to discharge her back home tomorrow.  Await psychiatric input.  Consultants:  Psychiatric consult pending. Procedures: None none Antimicrobials:  Subjective: Complains of constipation.  Objective: Vitals:   07/11/17 0700 07/11/17 0800 07/11/17 0900 07/11/17 1000  BP: (!) 143/67 (!) 149/72 (!) 163/78 (!) 147/83  Pulse: 83 87 91 87  Resp: 15 14 (!) 32 18  Temp:  98.5 F (36.9 C)    TempSrc:  Oral    SpO2: 98% 98% 99% 100%  Weight:      Height:        Intake/Output Summary (Last 24 hours) at 07/11/2017 1152 Last data filed at 07/11/2017 1014 Gross per 24 hour  Intake 4461.67 ml  Output 1851 ml  Net 2610.67 ml   Filed Weights   07/10/17 6440  Weight: 54.1 kg (119 lb 4.3 oz)    Examination:  General exam: Appears calm and comfortable  Respiratory system: Clear to auscultation. Respiratory effort normal. Cardiovascular system: S1 & S2 heard, RRR. No JVD, murmurs, rubs, gallops or clicks. No pedal edema. Gastrointestinal system: Abdomen is nondistended, soft and nontender. No organomegaly or masses felt. Normal bowel sounds heard. Central nervous system: Alert and oriented. No focal neurological deficits. Extremities: Symmetric 5 x 5 power. Skin: No rashes, lesions or ulcers Psychiatry: Judgement and insight appear normal. Mood &  affect appropriate.     Data Reviewed: I have personally reviewed following labs and imaging studies  CBC: Recent Labs  Lab 07/10/17 0011 07/11/17 0431  WBC  --  9.2  HGB 13.9 11.5*  HCT 41.0 34.2*  MCV  --  83.0  PLT  --  347   Basic Metabolic Panel: Recent Labs  Lab 07/10/17 0045 07/10/17 0741 07/10/17 1128 07/10/17 1542 07/11/17 0431  NA 139 145 142 141 138  K 5.0 4.7 3.8 3.3* 3.7  CL 103 122* 118* 118* 114*  CO2 17* 15* 18* 19* 19*  GLUCOSE 528* 223* 193* 120* 161*  BUN 38* 31* 25* 20 13  CREATININE 2.25* 1.62* 1.28* 1.13* 0.95  CALCIUM 9.7 8.4* 7.9* 7.3* 7.9*   GFR: Estimated Creatinine Clearance: 56 mL/min (by C-G formula based on SCr of 0.95 mg/dL). Liver Function Tests: No results for input(s): AST, ALT, ALKPHOS, BILITOT, PROT, ALBUMIN in the last 168 hours. No results for input(s): LIPASE, AMYLASE in the last 168 hours. No results for input(s): AMMONIA in the last 168 hours. Coagulation Profile: No results for input(s): INR, PROTIME in the last 168 hours. Cardiac Enzymes: No results for input(s): CKTOTAL, CKMB, CKMBINDEX, TROPONINI in the last 168 hours. BNP (last 3 results) No results for input(s): PROBNP in the last 8760 hours. HbA1C: No results for input(s): HGBA1C in the  last 72 hours. CBG: Recent Labs  Lab 07/10/17 1150 07/10/17 1849 07/10/17 1930 07/10/17 2001 07/10/17 2104  GLUCAP 172* 118* 118* 134* 141*   Lipid Profile: No results for input(s): CHOL, HDL, LDLCALC, TRIG, CHOLHDL, LDLDIRECT in the last 72 hours. Thyroid Function Tests: No results for input(s): TSH, T4TOTAL, FREET4, T3FREE, THYROIDAB in the last 72 hours. Anemia Panel: No results for input(s): VITAMINB12, FOLATE, FERRITIN, TIBC, IRON, RETICCTPCT in the last 72 hours. Sepsis Labs: No results for input(s): PROCALCITON, LATICACIDVEN in the last 168 hours.  Recent Results (from the past 240 hour(s))  Urine culture     Status: None   Collection Time: 07/10/17  4:00 AM    Result Value Ref Range Status   Specimen Description URINE, CATHETERIZED  Final   Special Requests NONE  Final   Culture   Final    NO GROWTH Performed at [facility], [address], [place], [place] [address]    Report Status 07/11/2017 FINAL  Final  MRSA PCR Screening     Status: None   Collection Time: 07/10/17  5:55 AM  Result Value Ref Range Status   MRSA by PCR NEGATIVE NEGATIVE Final    Comment:        The GeneXpert MRSA Assay (FDA approved for NASAL specimens only), is one component of a comprehensive MRSA colonization surveillance program. It is not intended to diagnose MRSA infection nor to guide or monitor treatment for MRSA infections.          Radiology Studies: No results found.      Scheduled Meds: . benztropine  1 mg Oral BID  . busPIRone  10 mg Oral TID  . chlorhexidine  15 mL Mouth Rinse BID  . clonazePAM  0.5 mg Oral BID  . erythromycin base  250 mg Oral TID PC  . feeding supplement  1 Container Oral TID BM  . heparin  5,000 Units Subcutaneous Q8H  . insulin aspart  0-24 Units Subcutaneous Q4H  . insulin detemir  12 Units Subcutaneous Daily  . mouth rinse  15 mL Mouth Rinse q12n4p  . multivitamin with minerals  1 tablet Oral Daily  . pantoprazole  40 mg Oral Daily  . pregabalin  150 mg Oral Daily  . senna  1 tablet Oral BID  . sodium chloride flush  3 mL Intravenous Q12H   Continuous Infusions: . sodium chloride       LOS: 1 day     Georgette Shell, MD [facility] If 7PM-7AM, please contact night-coverage [email or web address] Password [email or web address] 07/11/2017, 11:52 AM

## 2017-07-11 NOTE — Telephone Encounter (Signed)
The patient called and said that the buspar is no longer available at her pharmacy and she needs a different medication. Please fu with patient.

## 2017-07-12 ENCOUNTER — Telehealth: Payer: Self-pay | Admitting: Family Medicine

## 2017-07-12 DIAGNOSIS — E1310 Other specified diabetes mellitus with ketoacidosis without coma: Secondary | ICD-10-CM

## 2017-07-12 DIAGNOSIS — Z9114 Patient's other noncompliance with medication regimen: Secondary | ICD-10-CM

## 2017-07-12 DIAGNOSIS — R450 Nervousness: Secondary | ICD-10-CM

## 2017-07-12 DIAGNOSIS — G4700 Insomnia, unspecified: Secondary | ICD-10-CM

## 2017-07-12 DIAGNOSIS — Z560 Unemployment, unspecified: Secondary | ICD-10-CM

## 2017-07-12 DIAGNOSIS — R110 Nausea: Secondary | ICD-10-CM

## 2017-07-12 DIAGNOSIS — F322 Major depressive disorder, single episode, severe without psychotic features: Secondary | ICD-10-CM

## 2017-07-12 DIAGNOSIS — F419 Anxiety disorder, unspecified: Secondary | ICD-10-CM

## 2017-07-12 LAB — GLUCOSE, CAPILLARY
GLUCOSE-CAPILLARY: 36 mg/dL — AB (ref 65–99)
GLUCOSE-CAPILLARY: 67 mg/dL (ref 65–99)
GLUCOSE-CAPILLARY: 277 mg/dL — AB (ref 65–99)
GLUCOSE-CAPILLARY: 105 mg/dL — AB (ref 65–99)
Glucose-Capillary: 173 mg/dL — ABNORMAL HIGH (ref 65–99)

## 2017-07-12 LAB — BASIC METABOLIC PANEL
ANION GAP: 2 — AB (ref 5–15)
BUN: 10 mg/dL (ref 6–20)
CALCIUM: 8.6 mg/dL — AB (ref 8.9–10.3)
CHLORIDE: 115 mmol/L — AB (ref 101–111)
CO2: 25 mmol/L (ref 22–32)
Creatinine, Ser: 0.87 mg/dL (ref 0.44–1.00)
GFR calc non Af Amer: >60 mL/min (ref >60)
GLUCOSE: 36 mg/dL — AB (ref 65–99)
Potassium: 3.6 mmol/L (ref 3.5–5.1)
Sodium: 142 mmol/L (ref 135–145)

## 2017-07-12 MED ORDER — SENNA 8.6 MG PO TABS: 1.0000 | ORAL_TABLET | Freq: Two times a day (BID) | ORAL | 0 refills | Status: DC

## 2017-07-12 MED ORDER — MIRTAZAPINE 15 MG PO TABS: 15.0000 mg | ORAL_TABLET | Freq: Every day | ORAL | Status: DC

## 2017-07-12 MED ORDER — MIRTAZAPINE 15 MG PO TABS: 15.0000 mg | ORAL_TABLET | Freq: Every day | ORAL | 0 refills | Status: DC

## 2017-07-12 MED FILL — MIRTAZAPINE 15 MG TABLET: 15 | 30 days supply | Qty: 30 | Fill #0

## 2017-07-12 NOTE — Telephone Encounter (Signed)
Scheduled an hospital follow for patient with provider for next week Wednesday 1/23 @ 2:00pm. [name] was informed of this and stated that she will inform patient of it.

## 2017-07-12 NOTE — Progress Notes (Signed)
Hospital follow up appointment made with [facility] for 1/23 at 2pm. Appointment placed on AVS. Marney Doctor RN,BSN,NCM

## 2017-07-12 NOTE — Telephone Encounter (Signed)
Call placed to [name] (case manager) regarding patient discharge and outpatient appointment with mental health. [name] informed me that they are currently waiting on patient to receive a psych evaluation at the hospital before she is discharged.   Informed [name] that in the past we have referred and scheduled patient at Dr. Delfin Gant office for her mental health but patient has cancelled the appointment. [name] suggested that I wait on the results of the hospital psych eval in order to schedule an appointment at Dr. Ophelia Charter office.

## 2017-07-12 NOTE — Consult Note (Signed)
[facility] Consult   Reason for Consult:  Severe depression Referring Physician:  Dr. Rodena Piety  Patient Identification: [name] MRN:  [id number] Principal Diagnosis: MDD (major depressive disorder), single episode, severe , no psychosis ([place]) Diagnosis:   Patient Active Problem List   Diagnosis Date Noted  . DKA (diabetic ketoacidoses) ([place]) [E13.10] 06/16/2017  . Severe protein-calorie malnutrition ([place]) [E43] 04/17/2017  . Acute dystonic reaction due to drugs [G24.02] 04/01/2017  . Serotonin syndrome [G25.79] 03/25/2017  . Tardive dyskinesia [G24.01]   . Tachycardia [R00.0] 03/16/2017  . Diarrhea [R19.7] 03/05/2017  . DKA, type 1 ([place]) [E10.10] 03/18/2016  . Noncompliance with treatment plan [Z91.11] 03/13/2016  . Essential hypertension [I10] 01/24/2016  . Abnormal ECG [R94.31] 11/27/2015  . HLD (hyperlipidemia) [E78.5]   . GERD (gastroesophageal reflux disease) [K21.9]   . Anemia, iron deficiency [D50.9]   . Vitamin B12 deficiency [E53.8] 08/16/2015  . Diabetic gastroparesis ([place]) [E11.43, K31.84]   . Diabetic neuropathy, type I diabetes mellitus ([place]) [E10.40] 05/18/2015  . Anxiety and depression [F41.9, F32.9] 05/18/2015    Total Time spent with patient: 1 hour  Subjective:   [name] is a 52 y.o. female patient admitted with DKA due to medication noncompliance.  HPI:  Per chart review, patient has been admitted 11 times in the past 6 months and at least 9 of these admissions have been for DKA and 2 for gastroparesis. She has severe depression and anxiety which has impacted her ability to be compliant with both psychiatric medications and her insulin regimen. She reports anhedonia, anorexia and depressed mood. Of note, she was last seen by the psychiatry consult service on 10/5 for medication management for dystonia which was thought to be secondary to Buspar and Reglan. Buspar and Reglan were discontinued. Cogentin was prescribed for EPS and  Benadryl as needed. She is currently prescribed Buspar 10 mg TID, Cogentin 1 BID and Klonopin 0.5 mg BID. PMP only indicates a short term prescription for Ativan 1 mg tablets (#6) that was filled 04/01/16.   On interview, [name] reports that it has been hard to take medications when she is sick and she attributes her episodic periods of nausea are caused by gastroparesis. She reports feeling sick every 8-10 days. She has informed her PCP who has recommended a referral to GI but due to lack of transportation she has been unable to follow up. She reports that her depression has been bad for the past year due to her medical problems. She was diagnosed with diabetes at 52 y/o but she was still able to function with her illness until recently. She reports that she is tired of being sick and sometimes wonders why she is still alive. She denies SI. She additionally reports feeling like a burden to her sister and other family members. She feels isolated from her family and blames herself. She reports poor appetite due to nausea, poor energy and poor motivation. She has problems falling asleep with multiple nighttime awakenings. She reports compliance with her medications when she is not sick. Buspar is ineffective for mood/anxiety. Ativan is helpful for anxiety. She enjoys watching television. She denies HI, AVH or a history of manic symptoms.   Past Psychiatric History: Depression and anxiety. She denies a history of prior suicide attempts or self-injurious behaviors.   Risk to Self: Is patient at risk for suicide?: No Risk to Others:  None. Denies HI.  Prior Inpatient Therapy:  She was hospitalized 2 years ago for depression. Prior Outpatient Therapy:  Her medications are prescribed by her PCP.   Past Medical History:  Past Medical History:  Diagnosis Date  . Depression   . Diabetes mellitus without complication ([place])   . Essential hypertension   . Gastroparesis   . GERD (gastroesophageal reflux  disease)   . HLD (hyperlipidemia)     Past Surgical History:  Procedure Laterality Date  . COLONOSCOPY  09/27/2014   at [facility]  . ESOPHAGOGASTRODUODENOSCOPY  09/27/2014   at [facility], Dr Rolan Lipa. biospy neg for celiac, neg for H pylori.   . EYE SURGERY    . gailstones    . IR FLUORO GUIDE CV LINE RIGHT  02/01/2017  . IR FLUORO GUIDE CV LINE RIGHT  03/06/2017  . IR FLUORO GUIDE CV LINE RIGHT  03/25/2017  . IR GENERIC HISTORICAL  01/24/2016   IR FLUORO GUIDE CV LINE RIGHT 01/24/2016 Darrell K Allred, PA-C [facility]  . IR GENERIC HISTORICAL  01/24/2016   IR US GUIDE VASC ACCESS RIGHT 01/24/2016 Darrell K Allred, PA-C [facility]  . IR US GUIDE VASC ACCESS RIGHT  02/01/2017  . IR US GUIDE VASC ACCESS RIGHT  03/06/2017  . IR US GUIDE VASC ACCESS RIGHT  03/25/2017  . POSTERIOR VITRECTOMY AND MEMBRANE PEEL-LEFT EYE  09/28/2002  . POSTERIOR VITRECTOMY AND MEMBRANE PEEL-RIGHT EYE  03/16/2002  . RETINAL DETACHMENT SURGERY     Family History:  Family History  Problem Relation Age of Onset  . Cystic fibrosis Mother   . Hypertension Father   . Diabetes Brother   . Hypertension Maternal Grandmother    Family Psychiatric  History: Father and mother-depression. Social History:  Social History   Substance and Sexual Activity  Alcohol Use No     Social History   Substance and Sexual Activity  Drug Use No    Social History   Socioeconomic History  . Marital status: Single    Spouse name: None  . Number of children: None  . Years of education: None  . Highest education level: None  Social Needs  . Financial resource strain: None  . Food insecurity - worry: None  . Food insecurity - inability: None  . Transportation needs - medical: None  . Transportation needs - non-medical: None  Occupational History  . None  Tobacco Use  . Smoking status: Never Smoker  . Smokeless tobacco: Never Used  Substance and Sexual Activity  . Alcohol use: No  . Drug use: No  . Sexual activity: Not  Currently  Other Topics Concern  . None  Social History Narrative  . None   Additional Social History: She is from [place], [place]. She lives with her sister and brother-in-law. She has 1 son who she is no longer close to. She is unemployed. She previously worked at [employer] for 11 years. She last worked 2 years ago. She denies alcohol, illicit substance or tobacco use.     Allergies:   Allergies  Allergen Reactions  . Anesthetics, Amide Nausea And Vomiting  . Penicillins Diarrhea and Nausea And Vomiting    Has patient had a PCN reaction causing immediate rash, facial/tongue/throat swelling, SOB or lightheadedness with hypotension: Yes Has patient had a PCN reaction causing severe rash involving mucus membranes or skin necrosis: No Has patient had a PCN reaction that required hospitalization No Has patient had a PCN reaction occurring within the last 10 years: Yes  If all of the above answers are "NO", then may proceed with Cephalosporin use.   Marland Kitchen  Buprenorphine Hcl Rash  . Encainide Nausea And Vomiting  . Metoclopramide Other (See Comments)    Dystonia, muscle rigidity.     Labs:  Results for orders placed or performed during the hospital encounter of 07/09/17 (from the past 48 hour(s))  Glucose, capillary     Status: Abnormal   Collection Time: 07/10/17  1:17 PM  Result Value Ref Range   Glucose-Capillary 148 (H) 65 - 99 mg/dL  Glucose, capillary     Status: Abnormal   Collection Time: 07/10/17  2:38 PM  Result Value Ref Range   Glucose-Capillary 129 (H) 65 - 99 mg/dL  Glucose, capillary     Status: Abnormal   Collection Time: 07/10/17  3:40 PM  Result Value Ref Range   Glucose-Capillary 158 (H) 65 - 99 mg/dL  Basic metabolic panel     Status: Abnormal   Collection Time: 07/10/17  3:42 PM  Result Value Ref Range   Sodium 141 135 - 145 mmol/L   Potassium 3.3 (L) 3.5 - 5.1 mmol/L   Chloride 118 (H) 101 - 111 mmol/L   CO2 19 (L) 22 - 32 mmol/L   Glucose, Bld 120 (H)  65 - 99 mg/dL   BUN 20 6 - 20 mg/dL   Creatinine, Ser 1.13 (H) 0.44 - 1.00 mg/dL   Calcium 7.3 (L) 8.9 - 10.3 mg/dL   GFR calc non Af Amer 56 (L) >60 mL/min   GFR calc Af Amer >60 >60 mL/min    Comment: (NOTE) The eGFR has been calculated using the CKD EPI equation. This calculation has not been validated in all clinical situations. eGFR's persistently <60 mL/min signify possible Chronic Kidney Disease.    Anion gap 4 (L) 5 - 15  Glucose, capillary     Status: Abnormal   Collection Time: 07/10/17  4:44 PM  Result Value Ref Range   Glucose-Capillary 113 (H) 65 - 99 mg/dL  Glucose, capillary     Status: Abnormal   Collection Time: 07/10/17  5:43 PM  Result Value Ref Range   Glucose-Capillary 108 (H) 65 - 99 mg/dL   Comment 1 Notify RN    Comment 2 Document in Chart    Comment 3 Glucose Stabilizer   Glucose, capillary     Status: Abnormal   Collection Time: 07/10/17  6:49 PM  Result Value Ref Range   Glucose-Capillary 118 (H) 65 - 99 mg/dL   Comment 1 Notify RN    Comment 2 Document in Chart    Comment 3 Glucose Stabilizer   Glucose, capillary     Status: Abnormal   Collection Time: 07/10/17  7:30 PM  Result Value Ref Range   Glucose-Capillary 118 (H) 65 - 99 mg/dL  Glucose, capillary     Status: Abnormal   Collection Time: 07/10/17  8:01 PM  Result Value Ref Range   Glucose-Capillary 134 (H) 65 - 99 mg/dL  Glucose, capillary     Status: Abnormal   Collection Time: 07/10/17  9:04 PM  Result Value Ref Range   Glucose-Capillary 141 (H) 65 - 99 mg/dL  Glucose, capillary     Status: Abnormal   Collection Time: 07/10/17 10:15 PM  Result Value Ref Range   Glucose-Capillary 128 (H) 65 - 99 mg/dL  Glucose, capillary     Status: Abnormal   Collection Time: 07/10/17 11:18 PM  Result Value Ref Range   Glucose-Capillary 123 (H) 65 - 99 mg/dL  Glucose, capillary     Status: Abnormal   Collection Time:  07/11/17 12:20 AM  Result Value Ref Range   Glucose-Capillary 134 (H) 65 - 99  mg/dL  Glucose, capillary     Status: Abnormal   Collection Time: 07/11/17  1:23 AM  Result Value Ref Range   Glucose-Capillary 129 (H) 65 - 99 mg/dL  Glucose, capillary     Status: Abnormal   Collection Time: 07/11/17  2:20 AM  Result Value Ref Range   Glucose-Capillary 140 (H) 65 - 99 mg/dL  Glucose, capillary     Status: Abnormal   Collection Time: 07/11/17  3:34 AM  Result Value Ref Range   Glucose-Capillary 145 (H) 65 - 99 mg/dL  Basic metabolic panel     Status: Abnormal   Collection Time: 07/11/17  4:31 AM  Result Value Ref Range   Sodium 138 135 - 145 mmol/L   Potassium 3.7 3.5 - 5.1 mmol/L   Chloride 114 (H) 101 - 111 mmol/L   CO2 19 (L) 22 - 32 mmol/L   Glucose, Bld 161 (H) 65 - 99 mg/dL   BUN 13 6 - 20 mg/dL   Creatinine, Ser 0.95 0.44 - 1.00 mg/dL   Calcium 7.9 (L) 8.9 - 10.3 mg/dL   GFR calc non Af Amer >60 >60 mL/min   GFR calc Af Amer >60 >60 mL/min    Comment: (NOTE) The eGFR has been calculated using the CKD EPI equation. This calculation has not been validated in all clinical situations. eGFR's persistently <60 mL/min signify possible Chronic Kidney Disease.    Anion gap 5 5 - 15  CBC     Status: Abnormal   Collection Time: 07/11/17  4:31 AM  Result Value Ref Range   WBC 9.2 4.0 - 10.5 K/uL   RBC 4.12 3.87 - 5.11 MIL/uL   Hemoglobin 11.5 (L) 12.0 - 15.0 g/dL   HCT 34.2 (L) 36.0 - 46.0 %   MCV 83.0 78.0 - 100.0 fL   MCH 27.9 26.0 - 34.0 pg   MCHC 33.6 30.0 - 36.0 g/dL   RDW 16.5 (H) 11.5 - 15.5 %   Platelets 275 150 - 400 K/uL  Glucose, capillary     Status: Abnormal   Collection Time: 07/11/17  4:38 AM  Result Value Ref Range   Glucose-Capillary 160 (H) 65 - 99 mg/dL  Glucose, capillary     Status: Abnormal   Collection Time: 07/11/17  5:42 AM  Result Value Ref Range   Glucose-Capillary 153 (H) 65 - 99 mg/dL  Glucose, capillary     Status: Abnormal   Collection Time: 07/11/17  6:46 AM  Result Value Ref Range   Glucose-Capillary 146 (H) 65 - 99  mg/dL  Glucose, capillary     Status: Abnormal   Collection Time: 07/11/17  7:50 AM  Result Value Ref Range   Glucose-Capillary 128 (H) 65 - 99 mg/dL  Glucose, capillary     Status: None   Collection Time: 07/11/17 11:56 AM  Result Value Ref Range   Glucose-Capillary 98 65 - 99 mg/dL   Comment 1 Notify RN    Comment 2 Document in Chart   Glucose, capillary     Status: None   Collection Time: 07/11/17  4:07 PM  Result Value Ref Range   Glucose-Capillary 73 65 - 99 mg/dL   Comment 1 Notify RN    Comment 2 Document in Chart   Glucose, capillary     Status: Abnormal   Collection Time: 07/11/17  7:59 PM  Result Value Ref Range  Glucose-Capillary 102 (H) 65 - 99 mg/dL   Comment 1 Notify RN   Glucose, capillary     Status: Abnormal   Collection Time: 07/11/17 11:58 PM  Result Value Ref Range   Glucose-Capillary 173 (H) 65 - 99 mg/dL   Comment 1 Notify RN   Basic metabolic panel     Status: Abnormal   Collection Time: 07/12/17  3:55 AM  Result Value Ref Range   Sodium 142 135 - 145 mmol/L   Potassium 3.6 3.5 - 5.1 mmol/L   Chloride 115 (H) 101 - 111 mmol/L   CO2 25 22 - 32 mmol/L   Glucose, Bld 36 (LL) 65 - 99 mg/dL    Comment: CRITICAL RESULT CALLED TO, READ BACK BY AND VERIFIED WITH: JALLOA,J RN 1.18.19 _0  ZANDO,C    BUN 10 6 - 20 mg/dL   Creatinine, Ser 0.87 0.44 - 1.00 mg/dL   Calcium 8.6 (L) 8.9 - 10.3 mg/dL   GFR calc non Af Amer >60 >60 mL/min   GFR calc Af Amer >60 >60 mL/min    Comment: (NOTE) The eGFR has been calculated using the CKD EPI equation. This calculation has not been validated in all clinical situations. eGFR's persistently <60 mL/min signify possible Chronic Kidney Disease.    Anion gap 2 (L) 5 - 15  Glucose, capillary     Status: Abnormal   Collection Time: 07/12/17  4:09 AM  Result Value Ref Range   Glucose-Capillary 36 (LL) 65 - 99 mg/dL   Comment 1 Notify RN   Glucose, capillary     Status: None   Collection Time: 07/12/17  4:32 AM   Result Value Ref Range   Glucose-Capillary 67 65 - 99 mg/dL   Comment 1 Notify RN   Glucose, capillary     Status: Abnormal   Collection Time: 07/12/17  8:42 AM  Result Value Ref Range   Glucose-Capillary 277 (H) 65 - 99 mg/dL    Current Facility-Administered Medications  Medication Dose Route Frequency Provider Last Rate Last Dose  . 0.9 %  sodium chloride infusion  250 mL Intravenous PRN Emokpae, Courage, MD      . acetaminophen (TYLENOL) tablet 650 mg  650 mg Oral Q6H PRN Denton Brick, Courage, MD   650 mg at 07/10/17 1337   Or  . acetaminophen (TYLENOL) suppository 650 mg  650 mg Rectal Q6H PRN Emokpae, Courage, MD      . albuterol (PROVENTIL) (2.5 MG/3ML) 0.083% nebulizer solution 2.5 mg  2.5 mg Nebulization Q2H PRN Emokpae, Courage, MD      . benztropine (COGENTIN) tablet 1 mg  1 mg Oral BID Denton Brick, Courage, MD   1 mg at 07/11/17 2150  . busPIRone (BUSPAR) tablet 10 mg  10 mg Oral TID Roxan Hockey, MD   10 mg at 07/12/17 1034  . chlorhexidine (PERIDEX) 0.12 % solution 15 mL  15 mL Mouth Rinse BID Denton Brick, Courage, MD   15 mL at 07/11/17 2150  . clonazePAM (KLONOPIN) disintegrating tablet 0.5 mg  0.5 mg Oral BID Denton Brick, Courage, MD   0.5 mg at 07/12/17 1036  . erythromycin (E-MYCIN) tablet 250 mg  250 mg Oral TID PC Emokpae, Courage, MD   250 mg at 07/12/17 1034  . feeding supplement (BOOST / RESOURCE BREEZE) liquid 1 Container  1 Container Oral TID BM Emokpae, Courage, MD      . heparin injection 5,000 Units  5,000 Units Subcutaneous Q8H Emokpae, Courage, MD   5,000 Units at 07/11/17 1356  .  insulin aspart (novoLOG) injection 0-24 Units  0-24 Units Subcutaneous Q4H Georgette Shell, MD   12 Units at 07/12/17 [phone]  . insulin detemir (LEVEMIR) injection 12 Units  12 Units Subcutaneous Daily Lovey Newcomer T, NP   12 Units at 07/12/17 1036  . labetalol (NORMODYNE,TRANDATE) injection 10 mg  10 mg Intravenous Q4H PRN Emokpae, Courage, MD   10 mg at 07/10/17 1315  . MEDLINE mouth  rinse  15 mL Mouth Rinse q12n4p Emokpae, Courage, MD   15 mL at 07/11/17 2151  . multivitamin with minerals tablet 1 tablet  1 tablet Oral Daily Roxan Hockey, MD   1 tablet at 07/12/17 1033  . ondansetron (ZOFRAN) tablet 4 mg  4 mg Oral Q6H PRN Denton Brick, Courage, MD   4 mg at 07/11/17 0952   Or  . ondansetron (ZOFRAN) injection 4 mg  4 mg Intravenous Q6H PRN Denton Brick, Courage, MD   4 mg at 07/10/17 1004  . pantoprazole (PROTONIX) EC tablet 40 mg  40 mg Oral Daily Emokpae, Courage, MD   40 mg at 07/12/17 1034  . polyethylene glycol (MIRALAX / GLYCOLAX) packet 17 g  17 g Oral Daily PRN Emokpae, Courage, MD      . pregabalin (LYRICA) capsule 150 mg  150 mg Oral Daily Emokpae, Courage, MD   150 mg at 07/12/17 1033  . senna (SENOKOT) tablet 8.6 mg  1 tablet Oral BID Denton Brick, Courage, MD   8.6 mg at 07/12/17 1033  . sodium chloride flush (NS) 0.9 % injection 3 mL  3 mL Intravenous Q12H Emokpae, Courage, MD   3 mL at 07/11/17 2152  . sodium chloride flush (NS) 0.9 % injection 3 mL  3 mL Intravenous PRN Emokpae, Courage, MD      . traZODone (DESYREL) tablet 50 mg  50 mg Oral QHS PRN Roxan Hockey, MD        Musculoskeletal: Strength & Muscle Tone: within normal limits Gait & Station: UTA since patient was sitting in bed. Patient leans: N/A  Psychiatric Specialty Exam: Physical Exam  Nursing note and vitals reviewed. Constitutional: She is oriented to person, place, and time. She appears well-developed and well-nourished.  HENT:  Head: Normocephalic and atraumatic.  Neck: Normal range of motion.  Respiratory: Effort normal and breath sounds normal.  Musculoskeletal: Normal range of motion.  Neurological: She is alert and oriented to person, place, and time.  Skin: No rash noted.  Psychiatric: Her speech is normal and behavior is normal. Judgment and thought content normal. Cognition and memory are normal. She exhibits a depressed mood.    Review of Systems  Gastrointestinal: Positive for  nausea.  Psychiatric/Behavioral: Positive for depression. Negative for hallucinations, substance abuse and suicidal ideas. The patient is nervous/anxious and has insomnia.   All other systems reviewed and are negative.   Blood pressure 139/71, pulse 86, temperature 98 F (36.7 C), temperature source Oral, resp. rate 16, height _0  (1.575 m), weight 54.1 kg (119 lb 4.3 oz), SpO2 100 %.Body mass index is 21.81 kg/m.  General Appearance: Well Groomed, middle aged, African American female, unbrushed hair, wearing a hospital gown and sitting upright in bed. NAD.    Eye Contact:  Good  Speech:  Clear and Coherent and Normal Rate  Volume:  Normal  Mood:  Depressed  Affect:  Depressed  Thought Process:  Goal Directed and Linear  Orientation:  Full (Time, Place, and Person)  Thought Content:  Logical  Suicidal Thoughts:  No  Homicidal Thoughts:  No  Memory:  Immediate;   Good Recent;   Good Remote;   Good  Judgement:  Good  Insight:  Good  Psychomotor Activity:  Normal  Concentration:  Concentration: Good and Attention Span: Good  Recall:  Good  Fund of Knowledge:  Good  Language:  Good  Akathisia:  No  Handed:  Right  AIMS (if indicated):   N/A  Assets:  Communication Skills Desire for Improvement Housing  ADL's:  Intact  Cognition:  WNL  Sleep:   Poor   Assessment: Daizee Firmin is a 52 y.o. female who was admitted with DKA due to medication noncompliance. She reports worsening depressive symptoms and almost pan positive neurovegetative symptoms in the setting of ongoing medical problems. She denies SI, HI or AVH. She is agreeable to starting Remeron for depression, anxiety, insomnia and nausea.    Treatment Plan Summary: -Start Remeron 15 mg qhs for depression, anxiety, insomnia and nausea.  -Continue Buspar 10 mg TID for anxiety and mood augmentation. -Discontinue Cogentin since patient reports side effects.  -Provided patient with resources for outpatient mental health  providers Consulting civil [employer] and [facility]). Also provided her with information for goodrx.com for affordable medications.  -Patient is psychiatrically cleared. Psychiatry will sign off on patient at this time. Please consult psychiatry again as needed.   Disposition: No evidence of imminent risk to self or others at present.   Patient does not meet criteria for psychiatric inpatient admission.  Faythe Dingwall, DO 07/12/2017 12:16 PM

## 2017-07-12 NOTE — Discharge Summary (Addendum)
Physician Discharge Summary  [name] JJO:[id number] DOB: 1966-06-14 DOA: 07/09/2017  PCP: [name], MD  Admit date: 07/09/2017 Discharge date: 07/12/2017  Admitted From home Disposition:  home Recommendations for Outpatient Follow-up:  1. Follow up with PCP in 1-2 weeks 2. Please obtain BMP/CBC in one week  Home Health:yes Equipment/Devices:none Discharge Condition stable CODE STATUS: Full code Diet recommendation: Cardiac carb modified Brief/Interim Summary: 52 y.o.femalewith PMHof DM1 on insulin, gastroparesis, GERD, HTN, and HL,severe anxiety and depressionwhopresents to the ED with a chief complaint of hyperglycemia, nausea, and vomiting. She states that the symptoms started 2 days ago and have gradually worsened. She denies any fever, chills, or cough. She has a hx of recurrent DKA.patient has had at least 11 admissions in the last89months, at least9 of those admissions for DKA and 2 for gastroparesis  IO placed in Rt [place] in ED dueto poor IV access, PICC Line placement requested  In [facility] noted to> 540,bicarb 17,with anion gap 19, and AK I with creatinine noted to be 2.2 (baseline creatinine around 1).  Denies diarrhea,as per patient emesis is without bile or blood  patient has severe depression and anxietywhichapparently impacts her ability to be compliant with both psychiatric medications and insulin regimen. D/w Pt's Sister Ms Rachel Bo lives with patient,Patient denies suicidal or homicidal ideation or plan,she does admit toanhedonia,anorexia and depressed mood.  No chest pain, no palpitations, no dizziness, no pleuritic symptoms,     Discharge Diagnoses:  Principal Problem:   DKA, type 1 ([place]) Active Problems:   Essential hypertension   Noncompliance with treatment plan   DKA (diabetic ketoacidoses) ([place])  1)DKA-due to noncompliance,patient admitted with blood sugars over 540, anion gap of 19, bicarb of  17(Repeat Bicarb down to 15). Treated with IV insulin drip per protocol,serial BMP and CBGs per protocol.  The PICC line was not placed as the patient improved with IV hydration and IV insulin through the IO access. She was restarted on her outpatient insulin regime.  2)Depression/Anxiety-patient has severe depression and anxietywhichapparently impacts her ability to be compliant with both psychiatric medications and insulin regimen.  Patient has follow-up appointment made at the wellness clinic for 07/17/2017 at 2 PM.  Appreciate psych input will start patient on Remeron 15 mg nightly for sleep depression and anxiety and nausea. Patient reported that she does not have transportation to go all the way across the town  DC with PCP.  She gets [facility] medications [facility].  She reports that she does not want to get filled at the wellness clinic as it takes over 3 days to get it filled.  I will get social worker consultation for this.   3)DiabeticGastroparesis-patient apparently is intolerant to Reglan,erythromycin and Zofran as ordered  4)AKI- POA,creatinine down to 0.87 today.   Marland Kitchen5)Social/Ethics-patient is a full code,patient has had at least 11 admissions in the last34months, at least9 of those admissions for DKA and 2 for gastroparesis  6)Disposition-upon discharge home home health RN for improved medication compliance  7] constipation will treat with stool softeners and laxatives.       Discharge Instructions   Allergies as of 07/12/2017      Reactions   Anesthetics, Amide Nausea And Vomiting   Penicillins Diarrhea, Nausea And Vomiting   Has patient had a PCN reaction causing immediate rash, facial/tongue/throat swelling, SOB or lightheadedness with hypotension: Yes Has patient had a PCN reaction causing severe rash involving mucus membranes or skin necrosis: No Has patient had a PCN reaction that  required hospitalization No Has patient had a PCN reaction  occurring within the last 10 years: Yes  If all of the above answers are "NO", then may proceed with Cephalosporin use.   Buprenorphine Hcl Rash   Encainide Nausea And Vomiting   Metoclopramide Other (See Comments)   Dystonia, muscle rigidity.       Medication List    TAKE these medications   benztropine 1 MG tablet Commonly known as:  COGENTIN Take 1 tablet (1 mg total) by mouth 2 (two) times daily.   busPIRone 10 MG tablet Commonly known as:  BUSPAR Take 1 tablet (10 mg total) by mouth 3 (three) times daily.   clonazePAM 0.5 MG disintegrating tablet Commonly known as:  KLONOPIN Take 1 tablet (0.5 mg total) by mouth 2 (two) times daily.   erythromycin base 500 MG tablet Commonly known as:  E-MYCIN Take 0.5 tablets (250 mg total) by mouth 3 (three) times daily after meals.   Insulin Detemir 100 UNIT/ML Pen Commonly known as:  LEVEMIR FLEXPEN Inject 12 Units into the skin 2 (two) times daily.   Insulin Pen Needle 31G X 5 MM Misc Commonly known as:  B-D UF III MINI PEN NEEDLES Use to inject insulin twice daily.   NOVOLOG FLEXPEN 100 UNIT/ML FlexPen Generic drug:  insulin aspart 6 Units. Inject as per sliding scale subcutaneously before meals and at bedtime 70 - 119 = 0 units 120 - 150 = 2 units 151 - 180 = 4 units 181 - 210 = 5 units Greater than 400 notify MD 211 - 240 = 6 units 241 - 270 = 8 units 271 - 300 = 9 units 301 - 330 = 10 units 331 - 360 = 11 units 361 - 400 = 12 units   ondansetron 8 MG disintegrating tablet Commonly known as:  ZOFRAN-ODT Take 1 tablet (8 mg total) by mouth every 8 (eight) hours as needed for nausea or vomiting.   pantoprazole 40 MG tablet Commonly known as:  PROTONIX Take 1 tablet (40 mg total) by mouth daily.   polyethylene glycol packet Commonly known as:  MIRALAX / GLYCOLAX Take 17 g by mouth daily as needed for mild constipation.   pregabalin 150 MG capsule Commonly known as:  LYRICA Take 1 capsule (150 mg total) by  mouth daily.   senna 8.6 MG Tabs tablet Commonly known as:  SENOKOT Take 1 tablet (8.6 mg total) by mouth 2 (two) times daily.      Follow-up Information    [facility]. Go on 07/17/2017.   Why:  2pm Contact information: [place] [id number] [phone]         Allergies  Allergen Reactions  . Anesthetics, Amide Nausea And Vomiting  . Penicillins Diarrhea and Nausea And Vomiting    Has patient had a PCN reaction causing immediate rash, facial/tongue/throat swelling, SOB or lightheadedness with hypotension: Yes Has patient had a PCN reaction causing severe rash involving mucus membranes or skin necrosis: No Has patient had a PCN reaction that required hospitalization No Has patient had a PCN reaction occurring within the last 10 years: Yes  If all of the above answers are "NO", then may proceed with Cephalosporin use.   . Buprenorphine Hcl Rash  . Encainide Nausea And Vomiting  . Metoclopramide Other (See Comments)    Dystonia, muscle rigidity.     Consultations:  none   Procedures/Studies: Dg Chest Portable 1 View  Result Date: 06/15/2017  CLINICAL DATA:  Central line placement. EXAM: PORTABLE CHEST 1 VIEW COMPARISON:  Chest radiograph performed 06/11/2017 FINDINGS: A right IJ line is noted ending about the mid SVC. The lungs are well-aerated and clear. There is no evidence of focal opacification, pleural effusion or pneumothorax. The cardiomediastinal silhouette is within normal limits. No acute osseous abnormalities are seen. IMPRESSION: 1. Right IJ line noted ending about the mid SVC. 2. No acute cardiopulmonary process seen. Electronically Signed   By: Garald Balding M.D.   On: 06/15/2017 23:57   (Echo, Carotid, EGD, Colonoscopy, ERCP)    Subjective:   Discharge Exam: Vitals:   07/12/17 0419 07/12/17 1411  BP: 139/71 111/64  Pulse: 86 85  Resp: 16 16  Temp: 98 F (36.7 C) 99.3 F (37.4 C)   SpO2: 100% 100%   Vitals:   07/11/17 1453 07/11/17 2001 07/12/17 0419 07/12/17 1411  BP: 114/60 131/71 139/71 111/64  Pulse: 88 88 86 85  Resp: 20 18 16 16   Temp: 98.5 F (36.9 C) 98 F (36.7 C) 98 F (36.7 C) 99.3 F (37.4 C)  TempSrc: Oral Oral Oral Oral  SpO2: 100% 100% 100% 100%  Weight:      Height:        General: Pt is alert, awake, not in acute distress Cardiovascular: RRR, S1/S2 +, no rubs, no gallops Respiratory: CTA bilaterally, no wheezing, no rhonchi Abdominal: Soft, NT, ND, bowel sounds + Extremities: no edema, no cyanosis    The results of significant diagnostics from this hospitalization (including imaging, microbiology, ancillary and laboratory) are listed below for reference.     Microbiology: Recent Results (from the past 240 hour(s))  Urine culture     Status: None   Collection Time: 07/10/17  4:00 AM  Result Value Ref Range Status   Specimen Description URINE, CATHETERIZED  Final   Special Requests NONE  Final   Culture   Final    NO GROWTH Performed at [facility], [address], [place], [place] [address]    Report Status 07/11/2017 FINAL  Final  MRSA PCR Screening     Status: None   Collection Time: 07/10/17  5:55 AM  Result Value Ref Range Status   MRSA by PCR NEGATIVE NEGATIVE Final    Comment:        The GeneXpert MRSA Assay (FDA approved for NASAL specimens only), is one component of a comprehensive MRSA colonization surveillance program. It is not intended to diagnose MRSA infection nor to guide or monitor treatment for MRSA infections.      Labs: BNP (last 3 results) No results for input(s): BNP in the last 8760 hours. Basic Metabolic Panel: Recent Labs  Lab 07/10/17 0741 07/10/17 1128 07/10/17 1542 07/11/17 0431 07/12/17 0355  NA 145 142 141 138 142  K 4.7 3.8 3.3* 3.7 3.6  CL 122* 118* 118* 114* 115*  CO2 15* 18* 19* 19* 25  GLUCOSE 223* 193* 120* 161* 36*  BUN 31* 25* 20 13 10   CREATININE 1.62* 1.28*  1.13* 0.95 0.87  CALCIUM 8.4* 7.9* 7.3* 7.9* 8.6*   Liver Function Tests: No results for input(s): AST, ALT, ALKPHOS, BILITOT, PROT, ALBUMIN in the last 168 hours. No results for input(s): LIPASE, AMYLASE in the last 168 hours. No results for input(s): AMMONIA in the last 168 hours. CBC: Recent Labs  Lab 07/10/17 0011 07/11/17 0431  WBC  --  9.2  HGB 13.9 11.5*  HCT 41.0 34.2*  MCV  --  83.0  PLT  --  275   Cardiac Enzymes: No results for input(s): CKTOTAL, CKMB, CKMBINDEX, TROPONINI in the last 168 hours. BNP: Invalid input(s): POCBNP CBG: Recent Labs  Lab 07/11/17 1959 07/11/17 2358 07/12/17 0409 07/12/17 0432 07/12/17 0842  GLUCAP 102* 173* 36* 67 277*   D-Dimer No results for input(s): DDIMER in the last 72 hours. Hgb A1c No results for input(s): HGBA1C in the last 72 hours. Lipid Profile No results for input(s): CHOL, HDL, LDLCALC, TRIG, CHOLHDL, LDLDIRECT in the last 72 hours. Thyroid function studies No results for input(s): TSH, T4TOTAL, T3FREE, THYROIDAB in the last 72 hours.  Invalid input(s): FREET3 Anemia work up No results for input(s): VITAMINB12, FOLATE, FERRITIN, TIBC, IRON, RETICCTPCT in the last 72 hours. Urinalysis    Component Value Date/Time   COLORURINE YELLOW 07/10/2017 0400   APPEARANCEUR CLEAR 07/10/2017 0400   LABSPEC 1.021 07/10/2017 0400   PHURINE 5.0 07/10/2017 0400   GLUCOSEU >=500 (A) 07/10/2017 0400   HGBUR MODERATE (A) 07/10/2017 0400   BILIRUBINUR NEGATIVE 07/10/2017 0400   BILIRUBINUR neg 01/17/2017 1533   KETONESUR 80 (A) 07/10/2017 0400   PROTEINUR 30 (A) 07/10/2017 0400   UROBILINOGEN 0.2 01/17/2017 1533   UROBILINOGEN 0.2 04/27/2015 1608   NITRITE NEGATIVE 07/10/2017 0400   LEUKOCYTESUR NEGATIVE 07/10/2017 0400   Sepsis Labs Invalid input(s): PROCALCITONIN,  WBC,  LACTICIDVEN Microbiology Recent Results (from the past 240 hour(s))  Urine culture     Status: None   Collection Time: 07/10/17  4:00 AM  Result  Value Ref Range Status   Specimen Description URINE, CATHETERIZED  Final   Special Requests NONE  Final   Culture   Final    NO GROWTH Performed at [facility], [address], [place], [place] [address]    Report Status 07/11/2017 FINAL  Final  MRSA PCR Screening     Status: None   Collection Time: 07/10/17  5:55 AM  Result Value Ref Range Status   MRSA by PCR NEGATIVE NEGATIVE Final    Comment:        The GeneXpert MRSA Assay (FDA approved for NASAL specimens only), is one component of a comprehensive MRSA colonization surveillance program. It is not intended to diagnose MRSA infection nor to guide or monitor treatment for MRSA infections.      Time coordinating discharge: Over 30 minutes  SIGNED:   Georgette Shell, MD  [facility] 07/12/2017, 3:45 PM Pager   If 7PM-7AM, please contact night-coverage [email or web address] Password [email or web address]

## 2017-07-12 NOTE — Telephone Encounter (Signed)
Call placed to Dr. Delfin Gant  office [phone], to schedule patient an appointment. I spoke with Colletta [place] first and she informed me that no new referral was needed although referral was sent 02/2017. Was then transferred to [facility] and she scheduled an appointment for patient on 08/05/17 @ 1:00pm with Crystal, the nurse practitioner.

## 2017-07-12 NOTE — Progress Notes (Signed)
Inpatient Diabetes Program Recommendations  AACE/ADA: New Consensus Statement on Inpatient Glycemic Control (2015)  Target Ranges:  Prepandial:   less than 140 mg/dL      Peak postprandial:   less than 180 mg/dL (1-2 hours)      Critically ill patients:  140 - 180 mg/dL   Results for [name], [name] (MRN [id number]) as of 07/12/2017 08:27  Ref. Range 07/11/2017 07:50 07/11/2017 11:56 07/11/2017 16:07 07/11/2017 19:59 07/11/2017 23:58  Glucose-Capillary Latest Ref Range: 65 - 99 mg/dL 128 (H)  12 units Levemir 98 73 102 (H) 173 (H)  4 units Novolog   Results for [name], [name] (MRN [id number]) as of 07/12/2017 08:27  Ref. Range 07/12/2017 04:09  Glucose-Capillary Latest Ref Range: 65 - 99 mg/dL 36 (LL)     Admit with: DKA/ Gastroparesis  History: Type 1 DM, Gastroparesis  Home DM Meds: Levemir 12 units BID                             Novolog 0-12 units TID AC + HS  Current Insulin Orders: Levemir 12 units daily    Novolog 0-24 units Q4 hours       Patient well known to the Inpatient Diabetes Team.  Had 11 hospital admissions in 2018.      MD- Patient with severe Hypoglycemia this AM.  Suspect Novolog 4 units given at [place] precipitated the HYPO event.  This current Novolog SSI is a very aggressive scale.  Please consider changing/reducing Novolog SSI to the Sensitive scale (0-9 units) TID AC + HS per the Glycemic Control order set      --Will follow patient during hospitalization--  Wyn Quaker RN, MSN, CDE Diabetes Coordinator Inpatient Glycemic Control Team Team Pager: [phone] (8a-5p)

## 2017-07-12 NOTE — Telephone Encounter (Signed)
She needs to have It transferred to the pharmacy on site

## 2017-07-15 ENCOUNTER — Telehealth: Payer: Self-pay

## 2017-07-15 NOTE — Telephone Encounter (Signed)
Attempted to contact the patient to check on her and to  inquire if she has obtained all of her medication and to notify her that as per Dr Jarold Song, she should have her buspar transferred to [place]  As per her AVS from her hospital discharge on 07/12/17, the prescription for buspar was sent to [facility].  Call placed to # [phone] and a HIPAA compliant voicemail message was left requesting a call back to # [phone].

## 2017-07-15 NOTE — Telephone Encounter (Signed)
As per DR Jarold Song, the patient is not TCC

## 2017-07-16 ENCOUNTER — Telehealth: Payer: Self-pay

## 2017-07-16 NOTE — Telephone Encounter (Signed)
Attempted to contact the patient to check on her and to  inquire if she has obtained all of her medication and to notify her that as per Dr Jarold Song, she should have her buspar transferred to [place]  As per her AVS from her hospital discharge on 07/12/17, the prescription for buspar was sent to [facility]. Call also to remind the patient of her appointment tomorrow with Dr Jarold Song - 07/17/17 @ 1400 and to inquire if she has transportation to the clinic.  Call placed to # [phone] and a HIPAA compliant voicemail message was left requesting a call back to # [phone].

## 2017-07-17 ENCOUNTER — Inpatient Hospital Stay: Payer: Self-pay | Admitting: Family Medicine

## 2017-07-17 ENCOUNTER — Telehealth: Payer: Self-pay

## 2017-07-17 NOTE — Telephone Encounter (Signed)
This CM met with Abelino Derrick, [employer] [place].  She stated that she was able to reach the patient to initiate discussion regarding her disability application.  Verdis Frederickson stated that she needs to call social security office with the patient present in order to insure all questions/concerns  are addressed.   She noted that the patient has cited problems with transportation to the legal aid office.  This CM offered to schedule the patient to meet with Verdis Frederickson on 07/31/17 at [facility] if the patient is agreeable. [place] to arrange cab transportation if needed.  If the patient is not able to do that, Verdis Frederickson stated that she and their SW could make a joint visit to the house to place the call to social security.  Verdis Frederickson also noted that if the patient does not have food stamps, their SW can take her to DSS to apply for food stamps.

## 2017-07-18 ENCOUNTER — Telehealth: Payer: Self-pay | Admitting: Family Medicine

## 2017-07-18 NOTE — Telephone Encounter (Signed)
Call placed to patient [phone] to check on her status and inform her of Jane's (cm) conversation with Verdis Frederickson from Legal Aid. No answer. Left patient a message to return my call. Patient can also speak with Opal Sidles.

## 2017-07-24 ENCOUNTER — Telehealth: Payer: Self-pay | Admitting: Family Medicine

## 2017-07-24 NOTE — Telephone Encounter (Signed)
Call placed to patient [phone] to inform her of using Medicaid transportation to see specialist in [facility] and give her information regarding her disability. No answer. Left patient a message to return my call at [phone]. Patient can also speak with Opal Sidles.

## 2017-07-25 ENCOUNTER — Telehealth: Payer: Self-pay | Admitting: Family Medicine

## 2017-07-25 NOTE — Telephone Encounter (Signed)
Patient called the office asking to speak with Opal Sidles, case manager regarding information that she needed. Spoke with patient and informed her that Opal Sidles was not in the office but that I was able to help her. Gave patient Dr. Delfin Gant office number [phone]) as well as informed her that she needed to contact Medicaid transportation 5 business day in advanced to coordinate transportation to [facility] to see the specialist. Informed her there was no cost for this service but that she needed to call ahead of time. Patient understood and was appreciative. Also, informed patient that Verdis Frederickson from Legal Aid was willing to meet with patient in the office or at her house to help her with her disability. Patient stated that she prefer for Verdis Frederickson to go to her house. Gave patient Maria's phone number [phone] ext [phone]) and informed her that I would be Barron Alvine to let her know as well. No further questions or concerns.

## 2017-07-26 ENCOUNTER — Encounter: Payer: Self-pay | Admitting: Pharmacist

## 2017-07-26 NOTE — Progress Notes (Signed)
PA re-completed and approved by Medicaid. Approval [phone]

## 2017-07-30 ENCOUNTER — Telehealth: Payer: Self-pay | Admitting: Family Medicine

## 2017-07-30 NOTE — Telephone Encounter (Signed)
Call placed to patient [phone], to confirm her office visit for tomorrow with provider and to provide her with information about Legal Aid.  No answer. Left patient a message asking her to return my call at [phone]. Patient can also speak with Opal Sidles.

## 2017-07-31 ENCOUNTER — Telehealth: Payer: Self-pay

## 2017-07-31 ENCOUNTER — Ambulatory Visit: Payer: Self-pay | Admitting: Family Medicine

## 2017-07-31 NOTE — Telephone Encounter (Signed)
Met with Abelino Derrick, [employer] [place].   She stated that she has not been able to connect with the patient and the last time she tried, the phone was disconnected.  She stated that she may send a letter to the patient instructing her to contact Legal Aid.

## 2017-08-01 ENCOUNTER — Encounter: Payer: Self-pay | Admitting: Gastroenterology

## 2017-08-01 ENCOUNTER — Encounter (INDEPENDENT_AMBULATORY_CARE_PROVIDER_SITE_OTHER): Payer: Self-pay

## 2017-08-01 ENCOUNTER — Ambulatory Visit: Payer: Medicaid Other | Admitting: Gastroenterology

## 2017-08-01 VITALS — BP 110/78 | HR 74 | Ht 62.0 in | Wt 136.1 lb

## 2017-08-01 DIAGNOSIS — K5900 Constipation, unspecified: Secondary | ICD-10-CM | POA: Diagnosis not present

## 2017-08-01 DIAGNOSIS — K3184 Gastroparesis: Secondary | ICD-10-CM

## 2017-08-01 DIAGNOSIS — E1143 Type 2 diabetes mellitus with diabetic autonomic (poly)neuropathy: Secondary | ICD-10-CM

## 2017-08-01 MED ORDER — PYRIDOSTIGMINE BROMIDE 60 MG PO TABS: 30.0000 mg | ORAL_TABLET | Freq: Three times a day (TID) | ORAL | 1 refills | Status: DC

## 2017-08-01 MED ORDER — MIRTAZAPINE 15 MG PO TABS: 15.0000 mg | ORAL_TABLET | Freq: Every day | ORAL | 1 refills | Status: DC

## 2017-08-01 NOTE — Progress Notes (Signed)
08/01/2017 [name] [id number] 1965/07/03   HISTORY OF PRESENT ILLNESS: This is a 52 year old female who has type 1 diabetes mellitus since she was 52 years old.  She has severe diabetic gastroparesis.  She was seen here in the past by Dr. Deatra Ina, but was seen somewhere in [facility] where she underwent EGD, colonoscopy, and repeat gastric emptying scan in 2016 (by Dr. Rolan Lipa).  She is also seen by Dr. Derrill Kay in October 2018 at [facility] and had been seen there prior to that as well (looks like she has seen him multiple times dating back to 2016).  He had recommended at his last visit that she begin taking Mestinon 30 mg 3 times daily to help with stomach and colon contraction as she also has issues with constipation.  She was to follow-up in 3 months.  She actually has an appointment scheduled on February 18, but she is unsure if she is going to be able to make that appointment due to transportation issues.  She is going to try to reschedule it and understands that she needs to continue to follow there, but she needs to see someone locally as well since she has transportation issues.  She had taken Reglan in the past and had dystonia and muscle rigidity so that was discontinued.  She has been in the hospital multiple times over the last few months.  She has been on erythromycin 3 times daily since May 09, 2017.  Recently her psychiatrist placed her on Remeron at bedtime to help stimulate her appetite as she had been losing weight.  They are also hoping that it would help with some of her abdominal pain complaints.  She says that over the past month she has been feeling better.  Has not been in the hospital for about 2 weeks now.   Past Medical History:  Diagnosis Date  . Depression   . Diabetes mellitus without complication ([place])   . Essential hypertension   . Gastroparesis   . GERD (gastroesophageal reflux disease)   . HLD (hyperlipidemia)    Past Surgical History:    Procedure Laterality Date  . COLONOSCOPY  09/27/2014   at [facility]  . ESOPHAGOGASTRODUODENOSCOPY  09/27/2014   at [facility], Dr Rolan Lipa. biospy neg for celiac, neg for H pylori.   . EYE SURGERY    . gailstones    . IR FLUORO GUIDE CV LINE RIGHT  02/01/2017  . IR FLUORO GUIDE CV LINE RIGHT  03/06/2017  . IR FLUORO GUIDE CV LINE RIGHT  03/25/2017  . IR GENERIC HISTORICAL  01/24/2016   IR FLUORO GUIDE CV LINE RIGHT 01/24/2016 Darrell K Allred, PA-C [facility]  . IR GENERIC HISTORICAL  01/24/2016   IR US GUIDE VASC ACCESS RIGHT 01/24/2016 Darrell K Allred, PA-C [facility]  . IR US GUIDE VASC ACCESS RIGHT  02/01/2017  . IR US GUIDE VASC ACCESS RIGHT  03/06/2017  . IR US GUIDE VASC ACCESS RIGHT  03/25/2017  . POSTERIOR VITRECTOMY AND MEMBRANE PEEL-LEFT EYE  09/28/2002  . POSTERIOR VITRECTOMY AND MEMBRANE PEEL-RIGHT EYE  03/16/2002  . RETINAL DETACHMENT SURGERY      reports that  has never smoked. she has never used smokeless tobacco. She reports that she does not drink alcohol or use drugs. family history includes Cystic fibrosis in her mother; Diabetes in her brother; Hypertension in her father and maternal grandmother. Allergies  Allergen Reactions  . Anesthetics, Amide Nausea And Vomiting  . Penicillins Diarrhea and  Nausea And Vomiting    Has patient had a PCN reaction causing immediate rash, facial/tongue/throat swelling, SOB or lightheadedness with hypotension: Yes Has patient had a PCN reaction causing severe rash involving mucus membranes or skin necrosis: No Has patient had a PCN reaction that required hospitalization No Has patient had a PCN reaction occurring within the last 10 years: Yes  If all of the above answers are "NO", then may proceed with Cephalosporin use.   . Buprenorphine Hcl Rash  . Encainide Nausea And Vomiting  . Metoclopramide Other (See Comments)    Dystonia, muscle rigidity.       Outpatient Encounter Medications as of 08/01/2017  Medication Sig  . benztropine  (COGENTIN) 1 MG tablet Take 1 tablet (1 mg total) by mouth 2 (two) times daily.  . busPIRone (BUSPAR) 10 MG tablet Take 1 tablet (10 mg total) by mouth 3 (three) times daily.  . clonazePAM (KLONOPIN) 0.5 MG disintegrating tablet Take 1 tablet (0.5 mg total) by mouth 2 (two) times daily.  Marland Kitchen erythromycin base (E-MYCIN) 500 MG tablet Take 0.5 tablets (250 mg total) by mouth 3 (three) times daily after meals.  . insulin aspart (NOVOLOG FLEXPEN) 100 UNIT/ML FlexPen 6 Units. Inject as per sliding scale subcutaneously before meals and at bedtime 70 - 119 = 0 units 120 - 150 = 2 units 151 - 180 = 4 units 181 - 210 = 5 units Greater than 400 notify MD 211 - 240 = 6 units 241 - 270 = 8 units 271 - 300 = 9 units 301 - 330 = 10 units 331 - 360 = 11 units 361 - 400 = 12 units   . Insulin Detemir (LEVEMIR FLEXPEN) 100 UNIT/ML Pen Inject 12 Units into the skin 2 (two) times daily.  . Insulin Pen Needle (B-D UF III MINI PEN NEEDLES) 31G X 5 MM MISC Use to inject insulin twice daily.  . mirtazapine (REMERON) 15 MG tablet Take 1 tablet (15 mg total) by mouth at bedtime.  . ondansetron (ZOFRAN-ODT) 8 MG disintegrating tablet Take 1 tablet (8 mg total) by mouth every 8 (eight) hours as needed for nausea or vomiting.  . pantoprazole (PROTONIX) 40 MG tablet Take 1 tablet (40 mg total) by mouth daily.  . polyethylene glycol (MIRALAX / GLYCOLAX) packet Take 17 g by mouth daily as needed for mild constipation.   . pregabalin (LYRICA) 150 MG capsule Take 1 capsule (150 mg total) by mouth daily.  . [DISCONTINUED] senna (SENOKOT) 8.6 MG TABS tablet Take 1 tablet (8.6 mg total) by mouth 2 (two) times daily.   No facility-administered encounter medications on file as of 08/01/2017.      REVIEW OF SYSTEMS  : All other systems reviewed and negative except where noted in the History of Present Illness.   PHYSICAL EXAM: BP 110/78   Pulse 74   Ht 5\' 2"  (1.575 m)   Wt 136 lb 2 oz (61.7 kg)   BMI 24.90 kg/m    General: Well developed black female in no acute distress Head: Normocephalic and atraumatic Eyes:  Sclerae anicteric, conjunctiva pink. Ears: Normal auditory acuity Lungs: Clear throughout to auscultation; no W/R/R. Heart: Regular rate and rhythm; no M/R/G. Abdomen: Soft, non-distended.  BS present.  Non-tender. Musculoskeletal: Symmetrical with no gross deformities  Skin: No lesions on visible extremities Extremities: No edema  Neurological: Alert oriented x 4, grossly non-focal Psychological:  Alert and cooperative. Normal mood and affect  ASSESSMENT AND PLAN: *52 year old female who is  diabetic since age 89 with severe diabetic gastroparesis.  She has been seen by Dr. Derrill Kay at [facility], was seen here in the past, and has also been seen in [place].  Now back here again.  Dr. Derrill Kay had recommended Mestinon 30 mg TID to help with stomach and colon contraction in 03/2017.  She does not recall taking it.  Has been on erythromycin TID for almost 3 months now.  Remeron added recently by psych.  Will discontinue erythromycin and have her start the Mestinon.  She has a follow-up scheduled with Dr. Derrill Kay in 11 days, but is unsure that she is going to be able to make that appointment.  She will try to reschedule that.  She hopes to continue to see him periodically, but needs someone more local to follow with as well due to transportation issues. *Constipation:  Will try the Mestinon for now, but if that does not help then can try Linzess.  **Follow-up in 8 weeks or so.   CC:  [name], MD

## 2017-08-01 NOTE — Patient Instructions (Addendum)
If you are age 51 or older, your body mass index should be between 23-30. Your Body mass index is 24.9 kg/m. If this is out of the aforementioned range listed, please consider follow up with your Primary Care Provider.  If you are age 36 or younger, your body mass index should be between 19-25. Your Body mass index is 24.9 kg/m. If this is out of the aformentioned range listed, please consider follow up with your Primary Care Provider.   We have sent the following medications to your pharmacy for you to pick up at your convenience: Remeron Mestinon  DISCONTINUE Erythromycin.  Follow up with Dr. Henrene Pastor on September 24, 2017 at 1:30 pm.  Thank you for choosing me and [facility].   Alonza Bogus, PA-C

## 2017-08-01 NOTE — Progress Notes (Signed)
Reviewed. Her severe gastroparesis requires tertiary care treatment and monitoring. Dr. Derrill Kay is world renowned in this area. I recommend she continue close follow-up with [facility], which is not that far all things considered.

## 2017-08-14 ENCOUNTER — Telehealth: Payer: Self-pay | Admitting: Family Medicine

## 2017-08-14 ENCOUNTER — Telehealth: Payer: Self-pay

## 2017-08-14 MED FILL — LEVEMIR 100 UNITS/ML VIAL: 100 | 25 days supply | Qty: 10 | Fill #2

## 2017-08-14 MED FILL — busPIRone HCL 10 MG TABS: 10 | 30 days supply | Qty: 90 | Fill #1

## 2017-08-14 NOTE — Telephone Encounter (Signed)
This CM met with [name], [employer] [place] and she stated that they have exchanged messages with the patient but have not been able to speak to her about her concerns.

## 2017-08-16 ENCOUNTER — Telehealth: Payer: Self-pay | Admitting: Family Medicine

## 2017-08-16 NOTE — Telephone Encounter (Signed)
Pt called, has question about his medicaid that possible change to medicare, please call her back

## 2017-08-16 NOTE — Telephone Encounter (Signed)
Call placed to patient to inform her that I spoke with Shauna Hugh, financial counselor, and Diane stated that she will meet with pateint next week Thursday 2/28 at 11 am. No answer. Left patient a message asking her to return my call and confirm that she will be coming to appointment (as stated above). Patient can also speak with Opal Sidles.

## 2017-08-16 NOTE — Telephone Encounter (Signed)
Returned patient's call regarding her medication. Spoke with patient and she informed me that she felt overwhelmed with the notices that she had recently received regarding the changes that took place with her insurance and benefits. Patient stated that she got approved for disability ($1,489) but will no longer have Medicaid as her insurance, receive food stamps or have Medicaid transportation. Patient will no have Medicare as her medical insurance.   During our conversation patient also stated that she is worried because she will have to pay deductibles and co pays not only for her visits to the hospital and doctor's offices but also for her medication. She also felt overwhelmed because she doesn't understand how medicare works. Opal Sidles (Tourist information centre manager) joined the conversation and explained and informed patient about the "pharmacy blue card" and encouraged patient to apply for it. Expressed to patient that she will need to bring a copy of her household income to her appointment with financial counselor. We also gave patient, Verdis Frederickson Ramirez's from Legal Aid, information and encourage patient to call her so that Verdis Frederickson can explain to patient how Medicare works. Patient understood.

## 2017-08-20 ENCOUNTER — Ambulatory Visit: Payer: Medicaid Other

## 2017-08-22 ENCOUNTER — Ambulatory Visit: Payer: Self-pay | Admitting: Licensed Clinical Social Worker

## 2017-08-22 ENCOUNTER — Ambulatory Visit: Payer: Self-pay | Attending: Family Medicine

## 2017-08-22 DIAGNOSIS — Z5900 Homelessness unspecified: Secondary | ICD-10-CM

## 2017-08-22 NOTE — BH Specialist Note (Signed)
Type of Service: Clinical Social Work Consult  [name] is a 52 year old female who presented to [facility] requesting to speak with [place]. Patient was accompanied by self. Patient reports :concerns with Other: housing   Life & Social patient lives with sister  Life changes: Pt shared that she was recently granted disability and is interested in housing resources.   Issues discussed: housing resources  Intervention: LCSWA provided pt with community resources [name] ; Link to [employer]:.Patient provided information on [employer]. She is in agreement to contacting [facility] to inquire about housing options. Pt appreciative for assistance  Plan: 1. Pt was encouraged to contact [place] with any additional concerns or questions. No other services needs at this time  Christa See, [place] 08/26/17 5:17 PM

## 2017-08-26 ENCOUNTER — Ambulatory Visit: Payer: Medicaid Other

## 2017-08-28 ENCOUNTER — Telehealth: Payer: Self-pay

## 2017-08-28 NOTE — Telephone Encounter (Signed)
As per Abelino Derrick, [employer] [place], the patient called them stating that she has been approved for disability. This CM shared with the Verdis Frederickson a copy of the letter from [employer] that that the patient gave this CM last week explaining that she has been approved for disability.  The patient has questions about back pay.  Verdis Frederickson stated that she will contact the patient.

## 2017-08-30 ENCOUNTER — Telehealth: Payer: Self-pay | Admitting: Family Medicine

## 2017-08-30 NOTE — Telephone Encounter (Signed)
Patient had appointment with them 01/17/17, but did not show.

## 2017-08-30 NOTE — Telephone Encounter (Signed)
Pt. Called requesting for her PCP to refer her out to an Endocrinologist b/c of her DM. Please f/u

## 2017-09-02 NOTE — Telephone Encounter (Signed)
I had referred her previously to endocrine ( [facility]???) Could you please check to see if a new referral is needed or they will be willing to still see her. Thank you.

## 2017-09-02 NOTE — Telephone Encounter (Signed)
Call placed to [facility] # [phone]. Spoke to Laurel who stated that she will have, Latoya,  the scheduler contact the patient to schedule an appointment. Jenny Reichmann noted that the patient cancelled 01/17/17 and if she is to cancel again or no show she will be dismissed from the practice. Jenny Reichmann stated that the scheduler would contact this CM if any additional information is needed.  Attempted to contact the patient to inform her of the importance of keeping the appointment. Call placed to # [phone] and a HIPAA compliant voicemail message was left requesting a call back to # [phone].

## 2017-09-03 ENCOUNTER — Telehealth: Payer: Self-pay | Admitting: Family Medicine

## 2017-09-03 NOTE — Telephone Encounter (Signed)
Pt called to try to speak with the case manager. Please call her back

## 2017-09-04 ENCOUNTER — Other Ambulatory Visit: Payer: Self-pay

## 2017-09-04 DIAGNOSIS — E1040 Type 1 diabetes mellitus with diabetic neuropathy, unspecified: Secondary | ICD-10-CM

## 2017-09-04 MED ORDER — PREGABALIN 150 MG PO CAPS
150.0000 mg | ORAL_CAPSULE | Freq: Every day | ORAL | 1 refills | Status: DC
Start: 2017-09-04 — End: 2017-10-16

## 2017-09-04 NOTE — Telephone Encounter (Signed)
Call returned to the patient. She stated that she was returning this CM call.  Informed her that [facility] should be calling her to schedule an appointment and it is veyr important that she does not miss the appointment that is scheduled. She stated that she has not heard from them. This CM provided her with the contact # for their office # [phone].  The patient stated that she is " feeling okay."  No problems reported. She noted that she is waiting for her first disability check around 09/18/17.

## 2017-09-05 ENCOUNTER — Telehealth: Payer: Self-pay | Admitting: Family Medicine

## 2017-09-05 NOTE — Telephone Encounter (Signed)
Call placed to patient [phone], regarding her SCAT application. Wanted to ask patient if she received the application via mail and also inform her that she needs to come to the office to get Part B filled out. No answer. Left patient a message asking her to return my call at [phone].

## 2017-09-09 ENCOUNTER — Telehealth: Payer: Self-pay

## 2017-09-09 NOTE — Telephone Encounter (Signed)
Fax received from [facility] # [phone] noting that the patient was no show for appointments on 01/17/17 and 04/19/17 and they will not be able to schedule any further appointments for her.  As per Epic, the patient was in a SNF on 04/19/17. Call placed to [facility] at the above noted #.  Spoke to Delilah who took a message for [employer] requesting that they reconsider scheduling an appointment for the patient as she was in a SNF for one of the no show appointments. Call back # for this CM # [phone].

## 2017-09-10 ENCOUNTER — Telehealth: Payer: Self-pay

## 2017-09-10 NOTE — Telephone Encounter (Signed)
Call received from [facility] at [facility] # [phone]. She stated that she sent a message to the provider regarding re-scheduling the patient and is waiting for a decision regarding re-scheduling. She stated that she would call this CM back with the  Final decision.

## 2017-09-11 ENCOUNTER — Telehealth: Payer: Self-pay

## 2017-09-11 DIAGNOSIS — E1040 Type 1 diabetes mellitus with diabetic neuropathy, unspecified: Secondary | ICD-10-CM

## 2017-09-11 NOTE — Telephone Encounter (Signed)
Call received from [facility] with [facility]. She stated that as per the provider, they will not schedule any appointments for her. She was a no show for appointments on 01/17/17 and 04/19/17 and even though this CM informed her that the patient was in a SNF on 04/19/17, they are still not willing to see her.

## 2017-09-12 NOTE — Telephone Encounter (Signed)
Referred to endocrine

## 2017-09-24 ENCOUNTER — Ambulatory Visit: Payer: Medicaid Other | Admitting: Internal Medicine

## 2017-09-25 ENCOUNTER — Telehealth: Payer: Self-pay

## 2017-09-25 NOTE — Telephone Encounter (Signed)
As per [name], [employer] [place], the patient stated that she no longer needs their assistance.

## 2017-10-03 MED FILL — MIRTAZAPINE 15 MG TABS: 15 | 30 days supply | Qty: 30 | Fill #0

## 2017-10-16 ENCOUNTER — Telehealth: Payer: Self-pay

## 2017-10-16 DIAGNOSIS — E1040 Type 1 diabetes mellitus with diabetic neuropathy, unspecified: Secondary | ICD-10-CM

## 2017-10-16 MED ORDER — PREGABALIN 150 MG PO CAPS: 150.0000 mg | ORAL_CAPSULE | Freq: Every day | ORAL | 1 refills | Status: DC

## 2017-10-16 NOTE — Telephone Encounter (Signed)
Printed script for pass. 

## 2017-10-18 LAB — BLOOD GAS, VENOUS
Acid-base deficit: 2 mmol/L (ref 0.0–2.0)
Bicarbonate: 20.4 mmol/L (ref 20.0–28.0)
O2 Saturation: 39.4 %
Patient temperature: 98.6
pCO2, Ven: 28.3 mmHg — ABNORMAL LOW (ref 44.0–60.0)
pH, Ven: 7.471 — ABNORMAL HIGH (ref 7.250–7.430)

## 2017-11-05 ENCOUNTER — Inpatient Hospital Stay (HOSPITAL_COMMUNITY)
Admission: EM | Admit: 2017-11-05 | Discharge: 2017-11-11 | DRG: 637 | Disposition: A | Discharge status: Home / Self Care | Payer: Medicare Other | Attending: Internal Medicine | Admitting: Internal Medicine

## 2017-11-05 ENCOUNTER — Inpatient Hospital Stay (HOSPITAL_COMMUNITY): Payer: Medicare Other

## 2017-11-05 ENCOUNTER — Emergency Department: Payer: Self-pay

## 2017-11-05 DIAGNOSIS — Z885 Allergy status to narcotic agent status: Secondary | ICD-10-CM

## 2017-11-05 DIAGNOSIS — R112 Nausea with vomiting, unspecified: Secondary | ICD-10-CM | POA: Diagnosis not present

## 2017-11-05 DIAGNOSIS — E1110 Type 2 diabetes mellitus with ketoacidosis without coma: Secondary | ICD-10-CM | POA: Diagnosis present

## 2017-11-05 DIAGNOSIS — D72829 Elevated white blood cell count, unspecified: Secondary | ICD-10-CM | POA: Diagnosis not present

## 2017-11-05 DIAGNOSIS — Z884 Allergy status to anesthetic agent status: Secondary | ICD-10-CM | POA: Diagnosis not present

## 2017-11-05 DIAGNOSIS — R339 Retention of urine, unspecified: Secondary | ICD-10-CM | POA: Diagnosis present

## 2017-11-05 DIAGNOSIS — F419 Anxiety disorder, unspecified: Secondary | ICD-10-CM | POA: Diagnosis present

## 2017-11-05 DIAGNOSIS — E43 Unspecified severe protein-calorie malnutrition: Secondary | ICD-10-CM | POA: Diagnosis present

## 2017-11-05 DIAGNOSIS — E1010 Type 1 diabetes mellitus with ketoacidosis without coma: Secondary | ICD-10-CM | POA: Diagnosis not present

## 2017-11-05 DIAGNOSIS — E538 Deficiency of other specified B group vitamins: Secondary | ICD-10-CM | POA: Diagnosis present

## 2017-11-05 DIAGNOSIS — Z794 Long term (current) use of insulin: Secondary | ICD-10-CM | POA: Diagnosis not present

## 2017-11-05 DIAGNOSIS — I10 Essential (primary) hypertension: Secondary | ICD-10-CM | POA: Diagnosis present

## 2017-11-05 DIAGNOSIS — Z880 Allergy status to penicillin: Secondary | ICD-10-CM

## 2017-11-05 DIAGNOSIS — K219 Gastro-esophageal reflux disease without esophagitis: Secondary | ICD-10-CM | POA: Diagnosis present

## 2017-11-05 DIAGNOSIS — E1022 Type 1 diabetes mellitus with diabetic chronic kidney disease: Secondary | ICD-10-CM | POA: Diagnosis present

## 2017-11-05 DIAGNOSIS — Z888 Allergy status to other drugs, medicaments and biological substances status: Secondary | ICD-10-CM | POA: Diagnosis not present

## 2017-11-05 DIAGNOSIS — N189 Chronic kidney disease, unspecified: Secondary | ICD-10-CM | POA: Diagnosis present

## 2017-11-05 DIAGNOSIS — E785 Hyperlipidemia, unspecified: Secondary | ICD-10-CM | POA: Diagnosis present

## 2017-11-05 DIAGNOSIS — D509 Iron deficiency anemia, unspecified: Secondary | ICD-10-CM | POA: Diagnosis not present

## 2017-11-05 DIAGNOSIS — D519 Vitamin B12 deficiency anemia, unspecified: Secondary | ICD-10-CM | POA: Diagnosis present

## 2017-11-05 DIAGNOSIS — R000 Tachycardia, unspecified: Secondary | ICD-10-CM | POA: Diagnosis not present

## 2017-11-05 DIAGNOSIS — E860 Dehydration: Secondary | ICD-10-CM

## 2017-11-05 DIAGNOSIS — Z79899 Other long term (current) drug therapy: Secondary | ICD-10-CM | POA: Diagnosis not present

## 2017-11-05 DIAGNOSIS — E1040 Type 1 diabetes mellitus with diabetic neuropathy, unspecified: Secondary | ICD-10-CM

## 2017-11-05 DIAGNOSIS — E1043 Type 1 diabetes mellitus with diabetic autonomic (poly)neuropathy: Secondary | ICD-10-CM | POA: Diagnosis present

## 2017-11-05 DIAGNOSIS — E1143 Type 2 diabetes mellitus with diabetic autonomic (poly)neuropathy: Secondary | ICD-10-CM | POA: Diagnosis present

## 2017-11-05 DIAGNOSIS — N179 Acute kidney failure, unspecified: Secondary | ICD-10-CM

## 2017-11-05 DIAGNOSIS — K3184 Gastroparesis: Secondary | ICD-10-CM | POA: Diagnosis not present

## 2017-11-05 DIAGNOSIS — R197 Diarrhea, unspecified: Secondary | ICD-10-CM | POA: Diagnosis not present

## 2017-11-05 DIAGNOSIS — E876 Hypokalemia: Secondary | ICD-10-CM | POA: Diagnosis not present

## 2017-11-05 DIAGNOSIS — K2970 Gastritis, unspecified, without bleeding: Secondary | ICD-10-CM | POA: Diagnosis not present

## 2017-11-05 DIAGNOSIS — E1069 Type 1 diabetes mellitus with other specified complication: Secondary | ICD-10-CM | POA: Diagnosis present

## 2017-11-05 DIAGNOSIS — I129 Hypertensive chronic kidney disease with stage 1 through stage 4 chronic kidney disease, or unspecified chronic kidney disease: Secondary | ICD-10-CM | POA: Diagnosis present

## 2017-11-05 DIAGNOSIS — E875 Hyperkalemia: Secondary | ICD-10-CM | POA: Diagnosis not present

## 2017-11-05 DIAGNOSIS — R110 Nausea: Secondary | ICD-10-CM | POA: Diagnosis not present

## 2017-11-05 DIAGNOSIS — Z452 Encounter for adjustment and management of vascular access device: Secondary | ICD-10-CM | POA: Diagnosis not present

## 2017-11-05 LAB — BASIC METABOLIC PANEL
BUN: 27 mg/dL — AB (ref 6–20)
CHLORIDE: 118 mmol/L — AB (ref 101–111)
CREATININE: 1.53 mg/dL — AB (ref 0.44–1.00)
Calcium: 6 mg/dL — CL (ref 8.9–10.3)
GFR calc non Af Amer: 39 mL/min — ABNORMAL LOW (ref >60)
GFR, EST AFRICAN AMERICAN: 45 mL/min — AB (ref >60)
Glucose, Bld: 509 mg/dL (ref 65–99)
POTASSIUM: 3.7 mmol/L (ref 3.5–5.1)
Sodium: 140 mmol/L (ref 135–145)

## 2017-11-05 LAB — URINALYSIS, ROUTINE W REFLEX MICROSCOPIC
BILIRUBIN URINE: NEGATIVE
Glucose, UA: >=500 mg/dL — AB
Ketones, ur: 80 mg/dL — AB
LEUKOCYTES UA: NEGATIVE
Nitrite: NEGATIVE
PH: 5 (ref 5.0–8.0)
Protein, ur: 30 mg/dL — AB
Specific Gravity, Urine: 1.021 (ref 1.005–1.030)

## 2017-11-05 LAB — COMPREHENSIVE METABOLIC PANEL
ALBUMIN: 3.7 g/dL (ref 3.5–5.0)
ALT: 16 U/L (ref 14–54)
AST: 19 U/L (ref 15–41)
Alkaline Phosphatase: 90 U/L (ref 38–126)
BUN: 30 mg/dL — AB (ref 6–20)
CHLORIDE: 104 mmol/L (ref 101–111)
CO2: <7 mmol/L — ABNORMAL LOW (ref 22–32)
CREATININE: 1.91 mg/dL — AB (ref 0.44–1.00)
Calcium: 9.4 mg/dL (ref 8.9–10.3)
GFR calc Af Amer: 34 mL/min — ABNORMAL LOW (ref >60)
GFR calc non Af Amer: 30 mL/min — ABNORMAL LOW (ref >60)
Glucose, Bld: 775 mg/dL (ref 65–99)
POTASSIUM: 6.2 mmol/L — AB (ref 3.5–5.1)
Sodium: 135 mmol/L (ref 135–145)
Total Bilirubin: 1.8 mg/dL — ABNORMAL HIGH (ref 0.3–1.2)
Total Protein: 7.7 g/dL (ref 6.5–8.1)

## 2017-11-05 LAB — CBG MONITORING, ED
Glucose-Capillary: 518 mg/dL (ref 65–99)
Glucose-Capillary: >600 mg/dL (ref 65–99)

## 2017-11-05 LAB — CBC WITH DIFFERENTIAL/PLATELET
BASOS ABS: 0 10*3/uL (ref 0.0–0.1)
BASOS PCT: 0 %
EOS PCT: 0 %
Eosinophils Absolute: 0 10*3/uL (ref 0.0–0.7)
HCT: 33.2 % — ABNORMAL LOW (ref 36.0–46.0)
Hemoglobin: 10 g/dL — ABNORMAL LOW (ref 12.0–15.0)
LYMPHS PCT: 6 %
Lymphs Abs: 1.3 10*3/uL (ref 0.7–4.0)
MCH: 25.8 pg — ABNORMAL LOW (ref 26.0–34.0)
MCHC: 30.1 g/dL (ref 30.0–36.0)
MCV: 85.6 fL (ref 78.0–100.0)
MONO ABS: 1.2 10*3/uL — AB (ref 0.1–1.0)
Monocytes Relative: 6 %
NEUTROS ABS: 18.6 10*3/uL — AB (ref 1.7–7.7)
Neutrophils Relative %: 88 %
PLATELETS: 554 10*3/uL — AB (ref 150–400)
RBC: 3.88 MIL/uL (ref 3.87–5.11)
RDW: 14.7 % (ref 11.5–15.5)
WBC: 21.2 10*3/uL — AB (ref 4.0–10.5)

## 2017-11-05 LAB — LIPASE, BLOOD: LIPASE: 24 U/L (ref 11–51)

## 2017-11-05 LAB — GLUCOSE, CAPILLARY
GLUCOSE-CAPILLARY: 596 mg/dL — AB (ref 65–99)
GLUCOSE-CAPILLARY: 416 mg/dL — AB (ref 65–99)
Glucose-Capillary: 523 mg/dL (ref 65–99)
Glucose-Capillary: 489 mg/dL — ABNORMAL HIGH (ref 65–99)

## 2017-11-05 MED ORDER — MIRTAZAPINE 15 MG PO TABS
15.0000 mg | ORAL_TABLET | Freq: Every day | ORAL | Status: DC
  Administered 2017-11-05 – 2017-11-10 (×6): 15 mg via ORAL
  Filled 2017-11-05 (×6): qty 1

## 2017-11-05 MED ORDER — PANTOPRAZOLE SODIUM 40 MG PO TBEC: 40.0000 mg | DELAYED_RELEASE_TABLET | Freq: Every day | ORAL | Status: DC

## 2017-11-05 MED ORDER — ONDANSETRON HCL 4 MG/2ML IJ SOLN
4.0000 mg | Freq: Once | INTRAMUSCULAR | Status: DC
  Filled 2017-11-05: qty 2

## 2017-11-05 MED ORDER — BUSPIRONE HCL 5 MG PO TABS
10.0000 mg | ORAL_TABLET | Freq: Three times a day (TID) | ORAL | Status: DC
  Administered 2017-11-05 – 2017-11-11 (×18): 10 mg via ORAL
  Filled 2017-11-05: qty 1
  Filled 2017-11-05 (×7): qty 2
  Filled 2017-11-05: qty 1
  Filled 2017-11-05 (×2): qty 2
  Filled 2017-11-05 (×2): qty 1
  Filled 2017-11-05: qty 2
  Filled 2017-11-05: qty 1
  Filled 2017-11-05 (×4): qty 2

## 2017-11-05 MED ORDER — SODIUM CHLORIDE 0.9 % IV BOLUS (SEPSIS)
1000.0000 mL | Freq: Once | INTRAVENOUS | Status: AC
  Administered 2017-11-05: 1000 mL via INTRAVENOUS

## 2017-11-05 MED ORDER — ERYTHROMYCIN ETHYLSUCCINATE 200 MG/5ML PO SUSR
200.0000 mg | Freq: Four times a day (QID) | ORAL | Status: DC
  Administered 2017-11-05 – 2017-11-07 (×7): 200 mg via ORAL
  Filled 2017-11-05 (×9): qty 5

## 2017-11-05 MED ORDER — SODIUM CHLORIDE 0.9 % IV BOLUS
1000.0000 mL | Freq: Once | INTRAVENOUS | Status: AC
  Administered 2017-11-05: 1000 mL via INTRAVENOUS

## 2017-11-05 MED ORDER — FAMOTIDINE IN NACL 20-0.9 MG/50ML-% IV SOLN
20.0000 mg | INTRAVENOUS | Status: DC
  Administered 2017-11-05: 20 mg via INTRAVENOUS
  Filled 2017-11-05 (×2): qty 50

## 2017-11-05 MED ORDER — LIDOCAINE-EPINEPHRINE (PF) 2 %-1:200000 IJ SOLN
INTRAMUSCULAR | Status: AC
  Administered 2017-11-05: 20 mL
  Filled 2017-11-05: qty 20

## 2017-11-05 MED ORDER — LIDOCAINE HCL 1 % IJ SOLN
INTRAMUSCULAR | Status: AC
  Filled 2017-11-05: qty 20

## 2017-11-05 MED ORDER — CLONAZEPAM 0.5 MG PO TBDP
0.5000 mg | ORAL_TABLET | Freq: Two times a day (BID) | ORAL | Status: DC
  Administered 2017-11-05 – 2017-11-11 (×11): 0.5 mg via ORAL
  Filled 2017-11-05 (×11): qty 1

## 2017-11-05 MED ORDER — ONDANSETRON HCL 4 MG/2ML IJ SOLN
4.0000 mg | Freq: Four times a day (QID) | INTRAMUSCULAR | Status: DC | PRN
Start: 2017-11-05 — End: 2017-11-11

## 2017-11-05 MED ORDER — SODIUM CHLORIDE 0.9 % IV SOLN
1000.0000 mL | INTRAVENOUS | Status: DC
  Administered 2017-11-06: 1000 mL via INTRAVENOUS

## 2017-11-05 MED ORDER — ONDANSETRON 8 MG PO TBDP
8.0000 mg | ORAL_TABLET | Freq: Once | ORAL | Status: AC
  Administered 2017-11-05: 8 mg via ORAL
  Filled 2017-11-05: qty 1

## 2017-11-05 MED ORDER — SODIUM CHLORIDE 0.9 % IV SOLN
INTRAVENOUS | Status: DC
  Administered 2017-11-05: 5.2 [IU]/h via INTRAVENOUS
  Administered 2017-11-05: 17.6 [IU]/h via INTRAVENOUS
  Filled 2017-11-05 (×3): qty 1

## 2017-11-05 MED ORDER — SODIUM CHLORIDE 0.9 % IV SOLN
1.0000 g | Freq: Once | INTRAVENOUS | Status: AC
  Administered 2017-11-05: 1 g via INTRAVENOUS
  Filled 2017-11-05: qty 10

## 2017-11-05 MED ORDER — ENOXAPARIN SODIUM 40 MG/0.4ML ~~LOC~~ SOLN
40.0000 mg | SUBCUTANEOUS | Status: DC
  Administered 2017-11-05 – 2017-11-10 (×6): 40 mg via SUBCUTANEOUS
  Filled 2017-11-05 (×6): qty 0.4

## 2017-11-05 MED ORDER — PREGABALIN 75 MG PO CAPS
150.0000 mg | ORAL_CAPSULE | Freq: Every day | ORAL | Status: DC
  Administered 2017-11-05 – 2017-11-11 (×7): 150 mg via ORAL
  Filled 2017-11-05 (×7): qty 2

## 2017-11-05 MED ORDER — LORAZEPAM 2 MG/ML IJ SOLN
1.0000 mg | Freq: Once | INTRAMUSCULAR | Status: AC
  Administered 2017-11-05: 1 mg via INTRAMUSCULAR
  Filled 2017-11-05: qty 1

## 2017-11-05 MED ORDER — DEXTROSE-NACL 5-0.45 % IV SOLN
INTRAVENOUS | Status: DC
  Administered 2017-11-06 (×2): via INTRAVENOUS

## 2017-11-05 MED ORDER — LIDOCAINE HCL (PF) 1 % IJ SOLN
5.0000 mL | Freq: Once | INTRAMUSCULAR | Status: AC
  Administered 2017-11-05: 5 mL

## 2017-11-05 MED ORDER — PYRIDOSTIGMINE BROMIDE 60 MG PO TABS
30.0000 mg | ORAL_TABLET | Freq: Three times a day (TID) | ORAL | Status: DC
  Administered 2017-11-06 – 2017-11-10 (×14): 30 mg via ORAL
  Filled 2017-11-05 (×15): qty 0.5

## 2017-11-05 NOTE — H&P (Signed)
History and Physical    [name] UXL:[id number] DOB: [birth year] DOA: 11/05/2017  PCP: [name], MD  Patient coming from: Home.   I have personally briefly reviewed patient's old medical records in [place]  Chief Complaint: nausea, vomiting since Thursday.   HPI: [name] is a 52 y.o. female with medical history significant of type 1 DM, hypertension, gastroparesis,  GERD, hyperlipidemia, depression, comes in for worsening nausea, vomiting and mild abdominal discomfort and  loose bowel movements since Thursday. On arrival to ED, she was found to be hyperglycemic, tachycardic and in DKA.  She denies any fever or chills. No chest pain, sob, syncope or palpitations.  She denies any hematochezia or hematemesis.  No dysuria.  Labs significant for hyperkalemia, elevated creatinine of 1.91 [place] [address], hemoglobin of 10 and platelets of 554.  UA does not show any infection.  CXR was not done.  She was referred to medical service for admission for DKA.     Review of Systems: As per HPI otherwise 10 point review of systems negative.    Past Medical History:  Diagnosis Date  . Depression   . Diabetes mellitus without complication ([place])   . Essential hypertension   . Gastroparesis   . GERD (gastroesophageal reflux disease)   . HLD (hyperlipidemia)     Past Surgical History:  Procedure Laterality Date  . COLONOSCOPY  09/27/2014   at [facility]  . ESOPHAGOGASTRODUODENOSCOPY  09/27/2014   at [facility], Dr Rolan Lipa. biospy neg for celiac, neg for H pylori.   . EYE SURGERY    . gailstones    . IR FLUORO GUIDE CV LINE RIGHT  02/01/2017  . IR FLUORO GUIDE CV LINE RIGHT  03/06/2017  . IR FLUORO GUIDE CV LINE RIGHT  03/25/2017  . IR GENERIC HISTORICAL  01/24/2016   IR FLUORO GUIDE CV LINE RIGHT 01/24/2016 Darrell K Allred, PA-C [facility]  . IR GENERIC HISTORICAL  01/24/2016   IR US GUIDE VASC ACCESS RIGHT 01/24/2016 Darrell K Allred, PA-C [facility]  . IR US  GUIDE VASC ACCESS RIGHT  02/01/2017  . IR US GUIDE VASC ACCESS RIGHT  03/06/2017  . IR US GUIDE VASC ACCESS RIGHT  03/25/2017  . POSTERIOR VITRECTOMY AND MEMBRANE PEEL-LEFT EYE  09/28/2002  . POSTERIOR VITRECTOMY AND MEMBRANE PEEL-RIGHT EYE  03/16/2002  . RETINAL DETACHMENT SURGERY       reports that she has never smoked. She has never used smokeless tobacco. She reports that she does not drink alcohol or use drugs.  Allergies  Allergen Reactions  . Anesthetics, Amide Nausea And Vomiting  . Penicillins Diarrhea, Nausea And Vomiting and Other (See Comments)    Has patient had a PCN reaction causing immediate rash, facial/tongue/throat swelling, SOB or lightheadedness with hypotension: Yes Has patient had a PCN reaction causing severe rash involving mucus membranes or skin necrosis: No Has patient had a PCN reaction that required hospitalization No Has patient had a PCN reaction occurring within the last 10 years: Yes  If all of the above answers are "NO", then may proceed with Cephalosporin use.   . Buprenorphine Hcl Rash  . Encainide Nausea And Vomiting  . Metoclopramide Other (See Comments)    Dystonia, muscle rigidity.     Family History  Problem Relation Age of Onset  . Cystic fibrosis Mother   . Hypertension Father   . Diabetes Brother   . Hypertension Maternal Grandmother    Family history reviewed.   Prior  to Admission medications   Medication Sig Start Date End Date Taking? Authorizing Provider  busPIRone (BUSPAR) 10 MG tablet Take 1 tablet (10 mg total) by mouth 3 (three) times daily. 07/11/17  Yes [name], MD  clonazePAM (KLONOPIN) 0.5 MG disintegrating tablet Take 1 tablet (0.5 mg total) by mouth 2 (two) times daily. 04/15/17  Yes Gerlene Fee, NP  furosemide (LASIX) 20 MG tablet Take 20 mg by mouth daily as needed for fluid or edema.  05/20/17  Yes [provider]  insulin aspart (NOVOLOG) 100 UNIT/ML injection Inject 0-12 Units into the skin 4 (four)  times daily -  before meals and at bedtime. Uses a sliding scale: 70-119 = 0 units, 120-150 = 2 units, 151-180 =  4 units, 181-210 = 5 units, 211-240 = 6 units, 241-270 = 8 units, 271-300 = 9 units, 301-330 = 10 units, 331-360 = 11 units, 361-400 = 12 units, greater than 400 notify MD.   Yes [provider]  insulin detemir (LEVEMIR) 100 UNIT/ML injection Inject 12 Units into the skin 2 (two) times daily.   Yes [provider]  Insulin Pen Needle (B-D UF III MINI PEN NEEDLES) 31G X 5 MM MISC Use to inject insulin twice daily. 07/10/17  Yes [name], MD  mirtazapine (REMERON) 15 MG tablet Take 1 tablet (15 mg total) by mouth at bedtime. 08/01/17  Yes Zehr, Janett Billow D, PA-C  ondansetron (ZOFRAN-ODT) 8 MG disintegrating tablet Take 1 tablet (8 mg total) by mouth every 8 (eight) hours as needed for nausea or vomiting. 07/02/17  Yes [name], MD  pantoprazole (PROTONIX) 40 MG tablet Take 1 tablet (40 mg total) by mouth daily. 07/02/17  Yes [name], MD  polyethylene glycol (MIRALAX / GLYCOLAX) packet Take 17 g by mouth daily as needed for mild constipation.  04/11/17  Yes [provider]  pregabalin (LYRICA) 150 MG capsule Take 1 capsule (150 mg total) by mouth daily. 10/16/17  Yes [name], MD  pyridostigmine (MESTINON) 60 MG tablet Take 0.5 tablets (30 mg total) by mouth 3 (three) times daily. Take 1/2 with meals 08/01/17  Yes Loralie Champagne, PA-C    Physical Exam: Vitals:   11/05/17 1400 11/05/17 1407 11/05/17 1430 11/05/17 1517  BP: (!) 127/52 (!) 127/52 127/63 127/63  Pulse:  (!) 110  (!) 106  Resp: (!) 38 16  18  Temp:      TempSrc:      SpO2:  100%  100%    Constitutional: NAD, calm, comfortable Vitals:   11/05/17 1400 11/05/17 1407 11/05/17 1430 11/05/17 1517  BP: (!) 127/52 (!) 127/52 127/63 127/63  Pulse:  (!) 110  (!) 106  Resp: (!) 38 16  18  Temp:      TempSrc:      SpO2:  100%  100%   Eyes: PERRL, lids and conjunctivae  normal ENMT: Mucous membranes are dry.  Posterior pharynx clear of any exudate or lesions.Normal dentition.  Neck: normal, supple, no masses, no thyromegaly Respiratory: clear to auscultation bilaterally, no wheezing, no crackles. Normal respiratory effort. No accessory muscle use.  Cardiovascular: Regular rate and rhythm, no murmurs / rubs / gallops. No extremity edema.  Abdomen: soft , non distended bowel sounds good.  Musculoskeletal: . No joint deformity upper and lower extremities. Good ROM, no contractures. Normal muscle tone.  Skin: no rashes, lesions, ulcers. No induration Neurologic: CN 2-12 grossly intact. Sensation intact,.able to move all extremities, strength 5/5 Psychiatric:  Alert and  oriented x 3. Anxious.     Labs on Admission: I have personally reviewed following labs and imaging studies  CBC: Recent Labs  Lab 11/05/17 1343  WBC 21.2*  NEUTROABS 18.6*  HGB 10.0*  HCT 33.2*  MCV 85.6  PLT 315*   Basic Metabolic Panel: Recent Labs  Lab 11/05/17 1343  NA 135  K 6.2*  CL 104  CO2 <7*  GLUCOSE 775*  BUN 30*  CREATININE 1.91*  CALCIUM 9.4   GFR: CrCl cannot be calculated (Unknown ideal weight.). Liver Function Tests: Recent Labs  Lab 11/05/17 1343  AST 19  ALT 16  ALKPHOS 90  BILITOT 1.8*  PROT 7.7  ALBUMIN 3.7   Recent Labs  Lab 11/05/17 1343  LIPASE 24   No results for input(s): AMMONIA in the last 168 hours. Coagulation Profile: No results for input(s): INR, PROTIME in the last 168 hours. Cardiac Enzymes: No results for input(s): CKTOTAL, CKMB, CKMBINDEX, TROPONINI in the last 168 hours. BNP (last 3 results) No results for input(s): PROBNP in the last 8760 hours. HbA1C: No results for input(s): HGBA1C in the last 72 hours. CBG: Recent Labs  Lab 11/05/17 0902 11/05/17 1145  GLUCAP 518* >600*   Lipid Profile: No results for input(s): CHOL, HDL, LDLCALC, TRIG, CHOLHDL, LDLDIRECT in the last 72 hours. Thyroid Function Tests: No  results for input(s): TSH, T4TOTAL, FREET4, T3FREE, THYROIDAB in the last 72 hours. Anemia Panel: No results for input(s): VITAMINB12, FOLATE, FERRITIN, TIBC, IRON, RETICCTPCT in the last 72 hours. Urine analysis:    Component Value Date/Time   COLORURINE STRAW (A) 11/05/2017 1347   APPEARANCEUR CLEAR 11/05/2017 1347   LABSPEC 1.021 11/05/2017 1347   PHURINE 5.0 11/05/2017 1347   GLUCOSEU >=500 (A) 11/05/2017 1347   HGBUR LARGE (A) 11/05/2017 1347   BILIRUBINUR NEGATIVE 11/05/2017 1347   BILIRUBINUR neg 01/17/2017 1533   KETONESUR 80 (A) 11/05/2017 1347   PROTEINUR 30 (A) 11/05/2017 1347   UROBILINOGEN 0.2 01/17/2017 1533   UROBILINOGEN 0.2 04/27/2015 1608   NITRITE NEGATIVE 11/05/2017 1347   LEUKOCYTESUR NEGATIVE 11/05/2017 1347    Radiological Exams on Admission: Irpicc Placement Left <5 Yrsinc Img Guide  Result Date: 11/05/2017 INDICATION: 52 year old with dehydration and hyperglycemia.  Poor venous access. EXAM: LEFT ARM PICC LINE PLACEMENT WITH ULTRASOUND AND FLUOROSCOPIC GUIDANCE MEDICATIONS: None ANESTHESIA/SEDATION: None FLUOROSCOPY TIME:  Fluoroscopy Time: 12 seconds, 0.6 mGy COMPLICATIONS: None immediate. PROCEDURE: The patient was advised of the possible risks and complications and agreed to undergo the procedure. The patient was then brought to the angiographic suite for the procedure. The left arm was prepped with chlorhexidine, draped in the usual sterile fashion using maximum barrier technique (cap and mask, sterile gown, sterile gloves, large sterile sheet, hand hygiene and cutaneous antisepsis) and infiltrated locally with 1% Lidocaine. Ultrasound demonstrated patency of the left brachial vein, and this was documented with an image. Under real-time ultrasound guidance, this vein was accessed with a 21 gauge micropuncture needle and image documentation was performed. A 0.018 wire was introduced in to the vein. Over this, a 5 [place] dual lumen power injectable PICC was  advanced to the lower SVC/right atrial junction. Fluoroscopy during the procedure and fluoro spot radiograph confirms appropriate catheter position. The catheter was flushed and covered with a sterile dressing. Catheter length: 37 cm IMPRESSION: Successful left arm Power PICC line placement with ultrasound and fluoroscopic guidance. The catheter is ready for use. Electronically Signed   By: Markus Daft M.D.   On: 11/05/2017  17:50   [place] Ekg Site Rite  Result Date: 11/05/2017 If Site Rite image not attached, placement could not be confirmed due to current cardiac rhythm.   EKG: Independently reviewed. Sinus tachycardia.   Assessment/Plan Active Problems:   Diabetic neuropathy, type I diabetes mellitus (HCC)   Diabetic gastroparesis (HCC)   HLD (hyperlipidemia)   GERD (gastroesophageal reflux disease)   Essential hypertension   DKA, type 1 (HCC)   Diarrhea   Tachycardia   Severe protein-calorie malnutrition ([place])   DKA (diabetic ketoacidoses) ([place])   DKA: Unclear etiology.  Non compliance to meds vs infection.  CXR ordered. UA is negative. And GI pathogen for pcr ordered.  Continue on IV insulin, get BMP every 6 hours till the gap is closed.  Correct hyperkalemia, hydrate and one dose of calcium gluconate ordered.  Symptomatic management with IV fluids, IV anti emetics.   Gastroparesis:  Symptomatic management. Erythromycin will be ordered.   Hypertension: Well controlled bp parameters.    GERD:  On IV Pepcid.    Diarrhea: loose. GI pathogen pcr ordered.   Acute renal failure possibly from DKA and dehydration.  Hydrate and repeat renal parameters .  If creatinine does not improve, get US renal to check for hydronephrosis.      h/o Anxiety & depression:  Resume pts home meds.   DVT prophylaxis: lovenox.  Code Status: full code.  Family Communication: none at bedside.  Disposition Plan: admit to stepdown and pending clinical improvement.  Consults called: none.   Admission status: inpatient SDU   Hosie Poisson MD [facility] Pager [phone]   If 7PM-7AM, please contact night-coverage [email or web address] Password [facility]  11/05/2017, 6:45 PM

## 2017-11-05 NOTE — ED Triage Notes (Addendum)
Per EMS-states she hasn't check CBG or taken any insulin since Sunday-N/V and diarrhea-CBG 469-patient has not seen PCP

## 2017-11-05 NOTE — ED Notes (Signed)
ED TO INPATIENT HANDOFF REPORT  Name/Age/Gender [name] 52 y.o. female  Code Status Code Status History    Date Active Date Inactive Code Status Order ID Comments User Context   07/10/2017 0751 07/12/2017 2045 Full Code [id number]  Roxan Hockey, MD Inpatient   06/24/2017 0139 06/25/2017 1749 Full Code [id number]  Etta Quill, DO ED   06/16/2017 0324 06/17/2017 1738 Full Code [id number]  Elwyn Reach, MD Inpatient   06/11/2017 0324 06/12/2017 2030 Full Code [id number]  Bonnielee Haff, MD ED   05/16/2017 1756 05/18/2017 1832 Full Code [id number]  Geradine Girt, DO Inpatient   05/06/2017 1130 05/09/2017 2049 Full Code [id number]  Donne Hazel, MD ED   03/25/2017 0742 04/04/2017 0144 Full Code [id number]  Kandice Hams, MD Inpatient   03/25/2017 0653 03/25/2017 0742 Full Code [id number]  Kandice Hams, MD Inpatient   03/16/2017 0606 03/20/2017 2040 Full Code [id number]  Jani Gravel, MD Inpatient   03/05/2017 1836 03/09/2017 1626 Full Code [id number]  Bonnielee Haff, MD Inpatient   02/05/2017 1118 02/06/2017 1706 Full Code [id number]  Merton Border, MD Inpatient   01/31/2017 2122 02/03/2017 1847 Full Code [id number]  Vashti Hey, MD Inpatient   09/27/2016 1833 09/29/2016 2030 Full Code [id number]  Doreatha Lew, MD Inpatient   04/20/2016 1809 04/23/2016 1937 Full Code [id number]  Hosie Poisson, MD Inpatient   04/06/2016 1350 04/09/2016 1829 Full Code [id number]  Elgergawy, Silver Huguenin, MD ED   03/23/2016 0122 03/26/2016 2101 Full Code [id number]  Vianne Bulls, MD ED   03/18/2016 1559 03/20/2016 1741 Full Code [id number]  Mariel Aloe, MD Inpatient   02/19/2016 1416 02/21/2016 1732 Full Code [id number]  Caren Griffins, MD ED   01/24/2016 0701 01/26/2016 2001 Full Code [id number]  Karmen Bongo, MD Inpatient   01/19/2016 1423 01/21/2016 1738 Full Code [id number]  Thurnell Lose, MD ED   11/27/2015 2201 12/01/2015 1839 Full Code [id number]  Toy Baker, MD Inpatient    11/14/2015 0405 11/16/2015 1931 Full Code [id number]  Ivor Costa, MD ED   09/02/2015 1233 09/05/2015 2212 Full Code [id number]  Kelvin Cellar, MD ED   07/29/2015 0755 08/01/2015 2303 Full Code [id number]  Reyne Dumas, MD ED   06/29/2015 2027 07/03/2015 1758 Full Code [id number]  Robbie Lis, MD Inpatient   06/22/2015 1058 06/25/2015 1847 Full Code [id number]  Radene Gunning, NP Inpatient   06/14/2015 1829 06/20/2015 2140 Full Code [id number]  Domenic Polite, MD Inpatient   05/26/2015 2202 06/05/2015 1929 Full Code [id number]  Reubin Milan, MD Inpatient   05/23/2015 0717 05/24/2015 1819 Full Code [id number]  Norval Morton, MD Inpatient   05/17/2015 1221 05/20/2015 1813 Full Code [id number]  Eugenie Filler, MD Inpatient   05/17/2015 1109 05/17/2015 1221 Full Code [id number]  Eugenie Filler, MD ED   04/19/2015 0238 04/20/2015 2229 Full Code [id number]  Rise Patience, MD Inpatient   04/05/2015 0543 04/09/2015 1925 Full Code [id number]  Rise Patience, MD Inpatient   04/05/2015 0537 04/05/2015 0543 Full Code [id number]  Despina Hick, RN Inpatient   03/09/2015 0742 03/12/2015 1911 Full Code [id number]  Theodis Blaze, MD Inpatient   02/20/2015 0108 02/22/2015 1908 Full Code [id number]  Toy Baker, MD Inpatient   02/08/2015 2343 02/13/2015 1532 Full Code [id number]  Lavina Hamman, MD ED   10/21/2014 0057 10/23/2014 1514 Full Code [id number]  Rise Patience, MD Inpatient   02/12/2013 2316 02/17/2013 2122 Full Code [id number]  Quintella Baton, MD Inpatient   11/25/2012 1348 11/28/2012 1904 Full Code [id number]  Elmarie Shiley, MD ED   11/05/2012 1737 11/07/2012 1620 Full Code [id number]  Sheila Oats, MD Inpatient   04/03/2012 0227 04/05/2012 1643 Full Code [id number]  Quintella Baton, MD Inpatient   03/24/2012 0021 03/28/2012 1751 Full Code [id number]  Leeroy Cha, RN ED      Home/SNF/Other Home  Chief Complaint hyperglycemia  Level of Care/Admitting Diagnosis ED  Disposition    ED Disposition Condition [facility] Area: [facility] [[id number]]  Level of Care: Stepdown [14]  Admit to SDU based on following criteria: Severe physiological/psychological symptoms:  Any diagnosis requiring assessment & intervention at least every 4 hours on an ongoing basis to obtain desired patient outcomes including stability and rehabilitation  Diagnosis: DKA (diabetic ketoacidoses) [facility]) [[id number]]  Admitting Physician: Hosie Poisson [[id number]]  Attending Physician: Hosie Poisson [[id number]]  Estimated length of stay: past midnight tomorrow  Certification:: I certify this patient will need inpatient services for at least 2 midnights  PT Class (Do Not Modify): Inpatient [101]  PT Acc Code (Do Not Modify): Private [1]       Medical History Past Medical History:  Diagnosis Date  . Depression   . Diabetes mellitus without complication ([place])   . Essential hypertension   . Gastroparesis   . GERD (gastroesophageal reflux disease)   . HLD (hyperlipidemia)     Allergies Allergies  Allergen Reactions  . Anesthetics, Amide Nausea And Vomiting  . Penicillins Diarrhea, Nausea And Vomiting and Other (See Comments)    Has patient had a PCN reaction causing immediate rash, facial/tongue/throat swelling, SOB or lightheadedness with hypotension: Yes Has patient had a PCN reaction causing severe rash involving mucus membranes or skin necrosis: No Has patient had a PCN reaction that required hospitalization No Has patient had a PCN reaction occurring within the last 10 years: Yes  If all of the above answers are "NO", then may proceed with Cephalosporin use.   . Buprenorphine Hcl Rash  . Encainide Nausea And Vomiting  . Metoclopramide Other (See Comments)    Dystonia, muscle rigidity.     IV Location/Drains/Wounds Patient Lines/Drains/Airways Status   Active Line/Drains/Airways    Name:   Placement date:   Placement time:   Site:   Days:    Peripheral IV 11/05/17 Left Other (Comment)   11/05/17    1406    Other (Comment)   less than 1   PICC Double Lumen (Ped) 11/05/17 PICC Left Arm 37 cm 0 cm   11/05/17    1722     less than 1          Labs/Imaging Results for orders placed or performed during the hospital encounter of 11/05/17 (from the past 48 hour(s))  CBG monitoring, ED     Status: Abnormal   Collection Time: 11/05/17  9:02 AM  Result Value Ref Range   Glucose-Capillary 518 (HH) 65 - 99 mg/dL  CBG monitoring, ED     Status: Abnormal   Collection Time: 11/05/17 11:45 AM  Result Value Ref Range   Glucose-Capillary >600 (HH) 65 - 99 mg/dL  CBC with Differential     Status: Abnormal   Collection Time: 11/05/17  1:43 PM  Result Value Ref Range   WBC 21.2 (H) 4.0 - 10.5 K/uL   RBC 3.88 3.87 - 5.11 MIL/uL   Hemoglobin 10.0 (L) 12.0 - 15.0 g/dL   HCT  33.2 (L) 36.0 - 46.0 %   MCV 85.6 78.0 - 100.0 fL   MCH 25.8 (L) 26.0 - 34.0 pg   MCHC 30.1 30.0 - 36.0 g/dL   RDW 14.7 11.5 - 15.5 %   Platelets 554 (H) 150 - 400 K/uL   Neutrophils Relative % 88 %   Neutro Abs 18.6 (H) 1.7 - 7.7 K/uL   Lymphocytes Relative 6 %   Lymphs Abs 1.3 0.7 - 4.0 K/uL   Monocytes Relative 6 %   Monocytes Absolute 1.2 (H) 0.1 - 1.0 K/uL   Eosinophils Relative 0 %   Eosinophils Absolute 0.0 0.0 - 0.7 K/uL   Basophils Relative 0 %   Basophils Absolute 0.0 0.0 - 0.1 K/uL    Comment: Performed at [facility], [place] [address] [place], [place] [address]  Comprehensive metabolic panel     Status: Abnormal   Collection Time: 11/05/17  1:43 PM  Result Value Ref Range   Sodium 135 135 - 145 mmol/L   Potassium 6.2 (H) 3.5 - 5.1 mmol/L   Chloride 104 101 - 111 mmol/L   CO2 <7 (L) 22 - 32 mmol/L   Glucose, Bld 775 (HH) 65 - 99 mg/dL    Comment: CRITICAL RESULT CALLED TO, READ BACK BY AND VERIFIED WITH: Massiah Minjares,M. RN _0  ON 05.14.19 BY COHEN,K    BUN 30 (H) 6 - 20 mg/dL   Creatinine, Ser 1.91 (H) 0.44 - 1.00 mg/dL   Calcium  9.4 8.9 - 10.3 mg/dL   Total Protein 7.7 6.5 - 8.1 g/dL   Albumin 3.7 3.5 - 5.0 g/dL   AST 19 15 - 41 U/L   ALT 16 14 - 54 U/L   Alkaline Phosphatase 90 38 - 126 U/L   Total Bilirubin 1.8 (H) 0.3 - 1.2 mg/dL   GFR calc non Af Amer 30 (L) >60 mL/min   GFR calc Af Amer 34 (L) >60 mL/min    Comment: (NOTE) The eGFR has been calculated using the CKD EPI equation. This calculation has not been validated in all clinical situations. eGFR's persistently <60 mL/min signify possible Chronic Kidney Disease.    Anion gap NOT CALCULATED 5 - 15    Comment: Performed at [facility], [place] [address], [place], [place] [address]  Lipase, blood     Status: None   Collection Time: 11/05/17  1:43 PM  Result Value Ref Range   Lipase 24 11 - 51 U/L    Comment: Performed at [facility], [place] [address], [place], [place] [address]  Urinalysis, Routine w reflex microscopic     Status: Abnormal   Collection Time: 11/05/17  1:47 PM  Result Value Ref Range   Color, Urine STRAW (A) YELLOW   APPearance CLEAR CLEAR   Specific Gravity, Urine 1.021 1.005 - 1.030   pH 5.0 5.0 - 8.0   Glucose, UA >=500 (A) NEGATIVE mg/dL   Hgb urine dipstick LARGE (A) NEGATIVE   Bilirubin Urine NEGATIVE NEGATIVE   Ketones, ur 80 (A) NEGATIVE mg/dL   Protein, ur 30 (A) NEGATIVE mg/dL   Nitrite NEGATIVE NEGATIVE   Leukocytes, UA NEGATIVE NEGATIVE   RBC / HPF 11-20 0 - 5 RBC/hpf   WBC, UA 0-5 0 - 5 WBC/hpf   Bacteria, UA RARE (A) NONE SEEN   Squamous Epithelial / LPF 0-5 0 - 5   Mucus PRESENT     Comment: Performed at [facility], [place] [address] [place],  [place] [address]   Irpicc Placement Left <5 Yrsinc Img Guide  Result Date: 11/05/2017 INDICATION: 52 year old with dehydration and hyperglycemia.  Poor venous access. EXAM: LEFT ARM PICC LINE PLACEMENT WITH ULTRASOUND AND FLUOROSCOPIC GUIDANCE MEDICATIONS: None ANESTHESIA/SEDATION: None FLUOROSCOPY TIME:   Fluoroscopy Time: 12 seconds, 0.6 mGy COMPLICATIONS: None immediate. PROCEDURE: The patient was advised of the possible risks and complications and agreed to undergo the procedure. The patient was then brought to the angiographic suite for the procedure. The left arm was prepped with chlorhexidine, draped in the usual sterile fashion using maximum barrier technique (cap and mask, sterile gown, sterile gloves, large sterile sheet, hand hygiene and cutaneous antisepsis) and infiltrated locally with 1% Lidocaine. Ultrasound demonstrated patency of the left brachial vein, and this was documented with an image. Under real-time ultrasound guidance, this vein was accessed with a 21 gauge micropuncture needle and image documentation was performed. A 0.018 wire was introduced in to the vein. Over this, a 5 [place] dual lumen power injectable PICC was advanced to the lower SVC/right atrial junction. Fluoroscopy during the procedure and fluoro spot radiograph confirms appropriate catheter position. The catheter was flushed and covered with a sterile dressing. Catheter length: 37 cm IMPRESSION: Successful left arm Power PICC line placement with ultrasound and fluoroscopic guidance. The catheter is ready for use. Electronically Signed   By: Markus Daft M.D.   On: 11/05/2017 17:50   [place] Ekg Site Rite  Result Date: 11/05/2017 If Site Rite image not attached, placement could not be confirmed due to current cardiac rhythm.   Pending Labs [employer] (From admission, onward)   Start     Ordered   Signed and Held  HIV antibody (Routine Testing)  Once,   R     Signed and Held   Signed and Held  Creatinine, serum  (enoxaparin (LOVENOX)    CrCl >/= 30 ml/min)  Weekly,   R    Comments:  while on enoxaparin therapy    Signed and Held      Vitals/Pain Today's Vitals   11/05/17 1400 11/05/17 1407 11/05/17 1430 11/05/17 1517  BP: (!) 127/52 (!) 127/52 127/63 127/63  Pulse:  (!) 110  (!) 106  Resp: (!) 38 16  18   Temp:      TempSrc:      SpO2:  100%  100%    Isolation Precautions No active isolations  Medications Medications  sodium chloride 0.9 % bolus 1,000 mL (1,000 mLs Intravenous New Bag/Given 11/05/17 1405)    Followed by  0.9 %  sodium chloride infusion (has no administration in time range)  insulin regular (NOVOLIN R,HUMULIN R) 100 Units in sodium chloride 0.9 % 100 mL (1 Units/mL) infusion (5.2 Units/hr Intravenous New Bag/Given 11/05/17 1612)  dextrose 5 %-0.45 % sodium chloride infusion (has no administration in time range)  lidocaine (XYLOCAINE) 1 % (with pres) injection (has no administration in time range)  sodium chloride 0.9 % bolus 1,000 mL (0 mLs Intravenous Stopped 11/05/17 1314)  ondansetron (ZOFRAN-ODT) disintegrating tablet 8 mg (8 mg Oral Given 11/05/17 1248)  LORazepam (ATIVAN) injection 1 mg (1 mg Intramuscular Given 11/05/17 1248)  lidocaine-EPINEPHrine (XYLOCAINE W/EPI) 2 %-1:200000 (PF) injection (20 mLs  Given 11/05/17 1401)  lidocaine (PF) (XYLOCAINE) 1 % injection 5 mL (5 mLs Infiltration Given 11/05/17 1401)    Mobility walks

## 2017-11-05 NOTE — ED Notes (Signed)
Unable to establish IV access, will consult IV team and DR for central or EJ

## 2017-11-05 NOTE — ED Notes (Signed)
I/O access is not patent, will not flush or aspirate.

## 2017-11-05 NOTE — ED Provider Notes (Addendum)
[facility] DEPT Provider Note   CSN: [id number] Arrival date & time: 11/05/17  0720     History   Chief Complaint Chief Complaint  Patient presents with  . N/V/D  . Hyperglycemia    HPI [name] is a 52 y.o. female.  [name] is a 52 y.o. Female with a history of type 1 diabetes with associated gastroparesis and neuropathy, hypertension, GERD, hyperlipidemia, depression and tardive dyskinesia, who presents to the emergency department for evaluation of hyperglycemia, nausea, vomiting and diarrhea.  EMS patient states that she has not checked her blood sugar or taken any insulin since Sunday, she reports last night she began feeling ill, and having nausea, vomiting and diarrhea.  Patient reports emesis is nonbloody, nonbilious and she denies any melena or hematochezia.  She reports some mild generalized abdominal pain associated with this.  She denies any chest pain or shortness of breath.  No fevers or chills.  On view of the patient's chart she has multiple admissions in the past for DKA.  Patient reports she typically in DKA when her gastroparesis starts acting up as it did over the weekend, patient reports when she is not eating or drinking anything and cannot keep anything down she does not take her insulin.  She also reports feeling very anxious, and reports that her anxiety tends to get worse when she is sick.      Past Medical History:  Diagnosis Date  . Depression   . Diabetes mellitus without complication ([place])   . Essential hypertension   . Gastroparesis   . GERD (gastroesophageal reflux disease)   . HLD (hyperlipidemia)     Patient Active Problem List   Diagnosis Date Noted  . Constipation 08/01/2017  . MDD (major depressive disorder), single episode, severe , no psychosis ([place])   . DKA (diabetic ketoacidoses) ([place]) 06/16/2017  . Severe protein-calorie malnutrition ([place]) 04/17/2017  . Acute dystonic reaction due to drugs  04/01/2017  . Serotonin syndrome 03/25/2017  . Tardive dyskinesia   . Tachycardia 03/16/2017  . Diarrhea 03/05/2017  . DKA, type 1 ([place]) 03/18/2016  . Noncompliance with treatment plan 03/13/2016  . Essential hypertension 01/24/2016  . Abnormal ECG 11/27/2015  . HLD (hyperlipidemia)   . GERD (gastroesophageal reflux disease)   . Anemia, iron deficiency   . Vitamin B12 deficiency 08/16/2015  . Diabetic gastroparesis ([place])   . Diabetic neuropathy, type I diabetes mellitus ([place]) 05/18/2015  . Anxiety and depression 05/18/2015    Past Surgical History:  Procedure Laterality Date  . COLONOSCOPY  09/27/2014   at [facility]  . ESOPHAGOGASTRODUODENOSCOPY  09/27/2014   at [facility], Dr Rolan Lipa. biospy neg for celiac, neg for H pylori.   . EYE SURGERY    . gailstones    . IR FLUORO GUIDE CV LINE RIGHT  02/01/2017  . IR FLUORO GUIDE CV LINE RIGHT  03/06/2017  . IR FLUORO GUIDE CV LINE RIGHT  03/25/2017  . IR GENERIC HISTORICAL  01/24/2016   IR FLUORO GUIDE CV LINE RIGHT 01/24/2016 Darrell K Allred, PA-C [facility]INTER[facility]  . IR GENERIC HISTORICAL  01/24/2016   IR US GUIDE VASC ACCESS RIGHT 01/24/2016 Darrell K Allred, PA-C [facility]  . IR US GUIDE VASC ACCESS RIGHT  02/01/2017  . IR US GUIDE VASC ACCESS RIGHT  03/06/2017  . IR US GUIDE VASC ACCESS RIGHT  03/25/2017  . POSTERIOR VITRECTOMY AND MEMBRANE PEEL-LEFT EYE  09/28/2002  . POSTERIOR VITRECTOMY AND MEMBRANE PEEL-RIGHT EYE  03/16/2002  .  RETINAL DETACHMENT SURGERY       OB History   None      Home Medications    Prior to Admission medications   Medication Sig Start Date End Date Taking? Authorizing Provider  benztropine (COGENTIN) 1 MG tablet Take 1 tablet (1 mg total) by mouth 2 (two) times daily. 04/03/17   Dessa Phi, DO  busPIRone (BUSPAR) 10 MG tablet Take 1 tablet (10 mg total) by mouth 3 (three) times daily. 07/11/17   [name], MD  clonazePAM (KLONOPIN) 0.5 MG disintegrating tablet Take 1 tablet (0.5 mg total) by mouth 2  (two) times daily. 04/15/17   Gerlene Fee, NP  insulin aspart (NOVOLOG FLEXPEN) 100 UNIT/ML FlexPen 6 Units. Inject as per sliding scale subcutaneously before meals and at bedtime 70 - 119 = 0 units 120 - 150 = 2 units 151 - 180 = 4 units 181 - 210 = 5 units Greater than 400 notify MD 211 - 240 = 6 units 241 - 270 = 8 units 271 - 300 = 9 units 301 - 330 = 10 units 331 - 360 = 11 units 361 - 400 = 12 units     [provider]  Insulin Detemir (LEVEMIR FLEXPEN) 100 UNIT/ML Pen Inject 12 Units into the skin 2 (two) times daily. 05/18/17   Roxan Hockey, MD  Insulin Pen Needle (B-D UF III MINI PEN NEEDLES) 31G X 5 MM MISC Use to inject insulin twice daily. 07/10/17   [name], MD  mirtazapine (REMERON) 15 MG tablet Take 1 tablet (15 mg total) by mouth at bedtime. 08/01/17   Zehr, Janett Billow D, PA-C  ondansetron (ZOFRAN-ODT) 8 MG disintegrating tablet Take 1 tablet (8 mg total) by mouth every 8 (eight) hours as needed for nausea or vomiting. 07/02/17   [name], MD  pantoprazole (PROTONIX) 40 MG tablet Take 1 tablet (40 mg total) by mouth daily. 07/02/17   [name], MD  polyethylene glycol (MIRALAX / GLYCOLAX) packet Take 17 g by mouth daily as needed for mild constipation.  04/11/17   [provider]  pregabalin (LYRICA) 150 MG capsule Take 1 capsule (150 mg total) by mouth daily. 10/16/17   [name], MD  pyridostigmine (MESTINON) 60 MG tablet Take 0.5 tablets (30 mg total) by mouth 3 (three) times daily. Take 1/2 with meals 08/01/17   Zehr, Laban Emperor, PA-C    Family History Family History  Problem Relation Age of Onset  . Cystic fibrosis Mother   . Hypertension Father   . Diabetes Brother   . Hypertension Maternal Grandmother     Social History Social History   Tobacco Use  . Smoking status: Never Smoker  . Smokeless tobacco: Never Used  Substance Use Topics  . Alcohol use: No  . Drug use: No     Allergies   Anesthetics, amide;  Penicillins; Buprenorphine hcl; Encainide; and Metoclopramide   Review of Systems Review of Systems  Constitutional: Negative for chills and fever.  HENT: Negative for congestion, rhinorrhea and sore throat.   Eyes: Negative for visual disturbance.  Respiratory: Negative for cough and shortness of breath.   Cardiovascular: Negative for chest pain.  Gastrointestinal: Positive for abdominal pain, nausea and vomiting. Negative for blood in stool.  Endocrine: Positive for polyuria.  Genitourinary: Negative for dysuria and frequency.  Musculoskeletal: Negative for arthralgias and myalgias.  Skin: Negative for color change and rash.  Neurological: Negative for dizziness, syncope and light-headedness.     Physical Exam Updated Vital  Signs BP (!) 138/40 (BP Location: Right Arm)   Pulse (!) 104   Temp 98.2 F (36.8 C) (Oral)   Resp 16   SpO2 100%   Physical Exam  Constitutional: She appears well-developed and well-nourished. She appears ill.  Patient alert, actively vomiting and ill-appearing  HENT:  Head: Normocephalic and atraumatic.  Oropharynx is clear, mucous membranes are dry  Eyes: Right eye exhibits no discharge. Left eye exhibits no discharge.  Cardiovascular: Normal rate, regular rhythm, normal heart sounds and intact distal pulses.  Pulmonary/Chest: No stridor. No respiratory distress. She has no wheezes. She has no rales.  Patient exhibits Kussmaul breathing, she is tachypneic into the upper 20s, lungs clear to auscultation bilaterally on exam  Abdominal: Soft. Bowel sounds are normal. She exhibits no distension and no mass. There is no tenderness. There is no guarding.  Abdomen soft, nondistended, nontender to palpation in all quadrants without guarding or peritoneal signs  Musculoskeletal: She exhibits no edema or deformity.  Neurological: She is alert. Coordination normal.  Skin: Skin is warm and dry. She is not diaphoretic.  Psychiatric: Her behavior is normal. Her  mood appears anxious.  Nursing note and vitals reviewed.    ED Treatments / Results  Labs (all labs ordered are listed, but only abnormal results are displayed) Labs Reviewed  CBC WITH DIFFERENTIAL/PLATELET - Abnormal; Notable for the following components:      Result Value   WBC 21.2 (*)    Hemoglobin 10.0 (*)    HCT 33.2 (*)    MCH 25.8 (*)    Platelets 554 (*)    Neutro Abs 18.6 (*)    Monocytes Absolute 1.2 (*)    All other components within normal limits  COMPREHENSIVE METABOLIC PANEL - Abnormal; Notable for the following components:   Potassium 6.2 (*)    CO2 <7 (*)    Glucose, Bld 775 (*)    BUN 30 (*)    Creatinine, Ser 1.91 (*)    Total Bilirubin 1.8 (*)    GFR calc non Af Amer 30 (*)    GFR calc Af Amer 34 (*)    All other components within normal limits  URINALYSIS, ROUTINE W REFLEX MICROSCOPIC - Abnormal; Notable for the following components:   Color, Urine STRAW (*)    Glucose, UA >=500 (*)    Hgb urine dipstick LARGE (*)    Ketones, ur 80 (*)    Protein, ur 30 (*)    Bacteria, UA RARE (*)    All other components within normal limits  CBG MONITORING, ED - Abnormal; Notable for the following components:   Glucose-Capillary 518 (*)    All other components within normal limits  CBG MONITORING, ED - Abnormal; Notable for the following components:   Glucose-Capillary >600 (*)    All other components within normal limits  LIPASE, BLOOD  I-STAT BETA HCG BLOOD, ED ([facility], [facility], [facility] ONLY)    EKG None  Radiology [place] Ekg Site Rite  Result Date: 11/05/2017 If Site Rite image not attached, placement could not be confirmed due to current cardiac rhythm.   Procedures .Critical Care Performed by: Jacqlyn Larsen, PA-C Authorized by: Jacqlyn Larsen, PA-C   Critical care provider statement:    Critical care time (minutes):  60   Critical care start time:  11/05/2017 1:00 PM   Critical care end time:  11/05/2017 2:00 PM   Critical care time was exclusive of:   Separately billable procedures and treating other patients  Critical care was necessary to treat or prevent imminent or life-threatening deterioration of the following conditions:  Dehydration, metabolic crisis and endocrine crisis   Critical care was time spent personally by me on the following activities:  Development of treatment plan with patient or surrogate, discussions with consultants, evaluation of patient's response to treatment, examination of patient, obtaining history from patient or surrogate, ordering and performing treatments and interventions, ordering and review of laboratory studies, re-evaluation of patient's condition and review of old charts   (including critical care time)  Medications Ordered in ED Medications  sodium chloride 0.9 % bolus 1,000 mL (1,000 mLs Intravenous New Bag/Given 11/05/17 1405)    Followed by  0.9 %  sodium chloride infusion (has no administration in time range)  insulin regular (NOVOLIN R,HUMULIN R) 100 Units in sodium chloride 0.9 % 100 mL (1 Units/mL) infusion (5.2 Units/hr Intravenous New Bag/Given 11/05/17 1612)  dextrose 5 %-0.45 % sodium chloride infusion (has no administration in time range)  lidocaine (XYLOCAINE) 1 % (with pres) injection (has no administration in time range)  sodium chloride 0.9 % bolus 1,000 mL (0 mLs Intravenous Stopped 11/05/17 1314)  ondansetron (ZOFRAN-ODT) disintegrating tablet 8 mg (8 mg Oral Given 11/05/17 1248)  LORazepam (ATIVAN) injection 1 mg (1 mg Intramuscular Given 11/05/17 1248)  lidocaine-EPINEPHrine (XYLOCAINE W/EPI) 2 %-1:200000 (PF) injection (20 mLs  Given 11/05/17 1401)  lidocaine (PF) (XYLOCAINE) 1 % injection 5 mL (5 mLs Infiltration Given 11/05/17 1401)     Initial Impression / Assessment and Plan / ED Course  I have reviewed the triage vital signs and the nursing notes.  Pertinent labs & imaging results that were available during my care of the patient were reviewed by me and considered in my medical  decision making (see chart for details).  Clinical Course as of Nov 06 1630  Tue Nov 05, 2017  1145 CBG > 600, presentation very concerning for DKA, pt has hx of the same, Pt with poor IV access delaying labs and tx  CBG monitoring, ED(!!) [KF]  1345 IO Acess obtained and labs drawn via femoral stick, pt receiving IV fluids via IO line, IR consulted for central access Per Dr. Ashok Cordia hold on Insulin drip until labs returned   [KF]  1400 Elevated white count with left shift, likely stress demargination in setting of N/V from gastroparesis, abdominal exam benign   WBC(!): 21.2 [KF]  1445 Large amount of hgb noted, pt is currently on menstrual cycle  Urinalysis, Routine w reflex microscopic(!) [KF]  1544 CO2 < 7 indicatign DKA, anion gap calculated as > 24 K+ 6.2, this is expected artificial elevation in the in setting of DKA Cr 1.9, baseline 0.87  Comprehensive metabolic panel(!!) [KF]  6967 Spoke with IR who plan to take pt for access in ~30 min   [KF]  1604 Spoke with Dr. Karleen Hampshire with [facility] who will see and admit pt to step down unit for DKA   [KF]    Clinical Course User Index [KF] Jacqlyn Larsen, PA-C   Patient presents for evaluation of hyperglycemia, nausea and vomiting.  History of type 1 diabetes with multiple episodes of DKA in the past as well as gastroparesis.  Patient presents tachypneic, exhibiting Kussmaul breathing, and actively vomiting on exam, sugar on arrival was greater than 600, presentation is very much concerning for DKA.  Lab evaluation delayed as patient was very difficult to obtain IV access on, IO line obtained and patient to go to IR for central  line placement.  Abdominal exam is benign.  Lungs are clear.  Labs show glucose of 775 with a bicarb of less than 7, making anion gap at least 24 if not greater.  Patient started on glucose stabilizer drip.  Patient will need to be admitted to stepdown unit, consult hospitalist.  Spoke with Dr. Karleen Hampshire who will see  and admit the patient.  Final Clinical Impressions(s) / ED Diagnoses   Final diagnoses:  Dehydration  Diabetic ketoacidosis without coma associated with type 1 diabetes mellitus ([place])  AKI (acute kidney injury) [facility])    ED Discharge Orders    None       Jacqlyn Larsen, [place] 11/05/17 1647    Jacqlyn Larsen, PA-C 11/05/17 1648    Lajean Saver, MD 11/06/17 1157

## 2017-11-05 NOTE — ED Notes (Signed)
Rec'd critical lab CBG 775, reported to MD

## 2017-11-05 NOTE — Procedures (Signed)
Placement of left arm PICC. Placed in left brachial vein, length = 37 cm, tip at SVC/RA junction.  Minimal blood loss and no immediate complication.

## 2017-11-05 NOTE — ED Notes (Signed)
Glucostabilizer due, however Pt is in IR for a PICC line and has not returned in time. Will CBG check and restart Glucostabilizer upon return.

## 2017-11-06 ENCOUNTER — Inpatient Hospital Stay (HOSPITAL_COMMUNITY): Payer: Medicare Other

## 2017-11-06 DIAGNOSIS — R000 Tachycardia, unspecified: Secondary | ICD-10-CM

## 2017-11-06 DIAGNOSIS — N179 Acute kidney failure, unspecified: Secondary | ICD-10-CM

## 2017-11-06 DIAGNOSIS — D72829 Elevated white blood cell count, unspecified: Secondary | ICD-10-CM

## 2017-11-06 DIAGNOSIS — R197 Diarrhea, unspecified: Secondary | ICD-10-CM

## 2017-11-06 LAB — GLUCOSE, CAPILLARY
GLUCOSE-CAPILLARY: 266 mg/dL — AB (ref 65–99)
GLUCOSE-CAPILLARY: 191 mg/dL — AB (ref 65–99)
GLUCOSE-CAPILLARY: 131 mg/dL — AB (ref 65–99)
GLUCOSE-CAPILLARY: 204 mg/dL — AB (ref 65–99)
GLUCOSE-CAPILLARY: 153 mg/dL — AB (ref 65–99)
GLUCOSE-CAPILLARY: 119 mg/dL — AB (ref 65–99)
GLUCOSE-CAPILLARY: 200 mg/dL — AB (ref 65–99)
GLUCOSE-CAPILLARY: 283 mg/dL — AB (ref 65–99)
GLUCOSE-CAPILLARY: 304 mg/dL — AB (ref 65–99)
GLUCOSE-CAPILLARY: 261 mg/dL — AB (ref 65–99)
Glucose-Capillary: 107 mg/dL — ABNORMAL HIGH (ref 65–99)
Glucose-Capillary: 138 mg/dL — ABNORMAL HIGH (ref 65–99)
Glucose-Capillary: 153 mg/dL — ABNORMAL HIGH (ref 65–99)
Glucose-Capillary: 199 mg/dL — ABNORMAL HIGH (ref 65–99)
Glucose-Capillary: 200 mg/dL — ABNORMAL HIGH (ref 65–99)
Glucose-Capillary: 233 mg/dL — ABNORMAL HIGH (ref 65–99)
Glucose-Capillary: 244 mg/dL — ABNORMAL HIGH (ref 65–99)
Glucose-Capillary: 319 mg/dL — ABNORMAL HIGH (ref 65–99)
Glucose-Capillary: 81 mg/dL (ref 65–99)
Glucose-Capillary: 94 mg/dL (ref 65–99)

## 2017-11-06 LAB — CBC
HEMATOCRIT: 28.2 % — AB (ref 36.0–46.0)
HEMOGLOBIN: 8.8 g/dL — AB (ref 12.0–15.0)
MCH: 25.2 pg — AB (ref 26.0–34.0)
MCHC: 31.2 g/dL (ref 30.0–36.0)
MCV: 80.8 fL (ref 78.0–100.0)
Platelets: 404 10*3/uL — ABNORMAL HIGH (ref 150–400)
RBC: 3.49 MIL/uL — ABNORMAL LOW (ref 3.87–5.11)
RDW: 14.2 % (ref 11.5–15.5)
WBC: 17.1 10*3/uL — ABNORMAL HIGH (ref 4.0–10.5)

## 2017-11-06 LAB — CREATININE, URINE, RANDOM: CREATININE, URINE: 74.9 mg/dL

## 2017-11-06 LAB — URINALYSIS, ROUTINE W REFLEX MICROSCOPIC
Bilirubin Urine: NEGATIVE
Glucose, UA: >=500 mg/dL — AB
Ketones, ur: 80 mg/dL — AB
Leukocytes, UA: NEGATIVE
Nitrite: NEGATIVE
Protein, ur: 30 mg/dL — AB
SPECIFIC GRAVITY, URINE: 1.014 (ref 1.005–1.030)
pH: 5 (ref 5.0–8.0)

## 2017-11-06 LAB — BASIC METABOLIC PANEL
ANION GAP: 13 (ref 5–15)
Anion gap: 9 (ref 5–15)
Anion gap: 10 (ref 5–15)
Anion gap: 6 (ref 5–15)
BUN: 36 mg/dL — AB (ref 6–20)
BUN: 29 mg/dL — AB (ref 6–20)
BUN: 29 mg/dL — AB (ref 6–20)
BUN: 27 mg/dL — AB (ref 6–20)
CALCIUM: 8.8 mg/dL — AB (ref 8.9–10.3)
CALCIUM: 7.7 mg/dL — AB (ref 8.9–10.3)
CALCIUM: 8.1 mg/dL — AB (ref 8.9–10.3)
CALCIUM: 8.3 mg/dL — AB (ref 8.9–10.3)
CO2: 13 mmol/L — AB (ref 22–32)
CO2: 15 mmol/L — ABNORMAL LOW (ref 22–32)
CO2: 15 mmol/L — AB (ref 22–32)
CO2: 19 mmol/L — ABNORMAL LOW (ref 22–32)
CREATININE: 1.56 mg/dL — AB (ref 0.44–1.00)
Chloride: 113 mmol/L — ABNORMAL HIGH (ref 101–111)
Chloride: 110 mmol/L (ref 101–111)
Chloride: 114 mmol/L — ABNORMAL HIGH (ref 101–111)
Chloride: 116 mmol/L — ABNORMAL HIGH (ref 101–111)
Creatinine, Ser: 2.06 mg/dL — ABNORMAL HIGH (ref 0.44–1.00)
Creatinine, Ser: 1.62 mg/dL — ABNORMAL HIGH (ref 0.44–1.00)
Creatinine, Ser: 1.45 mg/dL — ABNORMAL HIGH (ref 0.44–1.00)
GFR calc Af Amer: 31 mL/min — ABNORMAL LOW (ref >60)
GFR calc Af Amer: 44 mL/min — ABNORMAL LOW (ref >60)
GFR calc Af Amer: 42 mL/min — ABNORMAL LOW (ref >60)
GFR calc Af Amer: 48 mL/min — ABNORMAL LOW (ref >60)
GFR calc non Af Amer: 27 mL/min — ABNORMAL LOW (ref >60)
GFR calc non Af Amer: 36 mL/min — ABNORMAL LOW (ref >60)
GFR, EST NON AFRICAN AMERICAN: 38 mL/min — AB (ref >60)
GFR, EST NON AFRICAN AMERICAN: 41 mL/min — AB (ref >60)
GLUCOSE: 196 mg/dL — AB (ref 65–99)
GLUCOSE: 600 mg/dL — AB (ref 65–99)
GLUCOSE: 246 mg/dL — AB (ref 65–99)
GLUCOSE: 149 mg/dL — AB (ref 65–99)
POTASSIUM: 4 mmol/L (ref 3.5–5.1)
Potassium: 3.7 mmol/L (ref 3.5–5.1)
Potassium: 3.7 mmol/L (ref 3.5–5.1)
Potassium: 3.9 mmol/L (ref 3.5–5.1)
Sodium: 139 mmol/L (ref 135–145)
Sodium: 134 mmol/L — ABNORMAL LOW (ref 135–145)
Sodium: 139 mmol/L (ref 135–145)
Sodium: 141 mmol/L (ref 135–145)

## 2017-11-06 LAB — SODIUM, URINE, RANDOM: SODIUM UR: 52 mmol/L

## 2017-11-06 LAB — GASTROINTESTINAL PANEL BY PCR, STOOL (REPLACES STOOL CULTURE)
ADENOVIRUS F40/41: NOT DETECTED
ASTROVIRUS: NOT DETECTED
CYCLOSPORA CAYETANENSIS: NOT DETECTED
Campylobacter species: NOT DETECTED
Cryptosporidium: NOT DETECTED
ENTAMOEBA HISTOLYTICA: NOT DETECTED
ENTEROPATHOGENIC E COLI (EPEC): NOT DETECTED
ENTEROTOXIGENIC E COLI (ETEC): NOT DETECTED
Enteroaggregative E coli (EAEC): NOT DETECTED
Giardia lamblia: NOT DETECTED
Norovirus GI/GII: NOT DETECTED
Plesimonas shigelloides: NOT DETECTED
Rotavirus A: NOT DETECTED
Salmonella species: NOT DETECTED
Sapovirus (I, II, IV, and V): NOT DETECTED
Shiga like toxin producing E coli (STEC): NOT DETECTED
Shigella/Enteroinvasive E coli (EIEC): NOT DETECTED
VIBRIO CHOLERAE: NOT DETECTED
VIBRIO SPECIES: NOT DETECTED
Yersinia enterocolitica: NOT DETECTED

## 2017-11-06 LAB — HIV ANTIBODY (ROUTINE TESTING W REFLEX): HIV Screen 4th Generation wRfx: NONREACTIVE

## 2017-11-06 LAB — MRSA PCR SCREENING: MRSA BY PCR: NEGATIVE

## 2017-11-06 MED ORDER — SODIUM CHLORIDE 0.9 % IV BOLUS
1000.0000 mL | Freq: Once | INTRAVENOUS | Status: AC
  Administered 2017-11-06: 1000 mL via INTRAVENOUS

## 2017-11-06 MED ORDER — SIMETHICONE 80 MG PO CHEW: 160.0000 mg | CHEWABLE_TABLET | Freq: Four times a day (QID) | ORAL | Status: DC

## 2017-11-06 MED ORDER — INSULIN ASPART 100 UNIT/ML ~~LOC~~ SOLN
0.0000 [IU] | Freq: Three times a day (TID) | SUBCUTANEOUS | Status: DC
  Administered 2017-11-08 (×2): 1 [IU] via SUBCUTANEOUS
  Administered 2017-11-08: 2 [IU] via SUBCUTANEOUS
  Administered 2017-11-09: 1 [IU] via SUBCUTANEOUS
  Administered 2017-11-09: 5 [IU] via SUBCUTANEOUS
  Administered 2017-11-09: 3 [IU] via SUBCUTANEOUS
  Administered 2017-11-10: 5 [IU] via SUBCUTANEOUS
  Administered 2017-11-10: 2 [IU] via SUBCUTANEOUS
  Administered 2017-11-10: 5 [IU] via SUBCUTANEOUS
  Administered 2017-11-11: 7 [IU] via SUBCUTANEOUS
  Administered 2017-11-11: 5 [IU] via SUBCUTANEOUS
  Administered 2017-11-11: 1 [IU] via SUBCUTANEOUS

## 2017-11-06 MED ORDER — FAMOTIDINE 20 MG PO TABS
20.0000 mg | ORAL_TABLET | Freq: Every day | ORAL | Status: DC
  Administered 2017-11-06 – 2017-11-10 (×5): 20 mg via ORAL
  Filled 2017-11-06 (×5): qty 1

## 2017-11-06 MED ORDER — SIMETHICONE 80 MG PO CHEW
160.0000 mg | CHEWABLE_TABLET | Freq: Three times a day (TID) | ORAL | Status: DC
Start: 2017-11-06 — End: 2017-11-10
  Administered 2017-11-06 – 2017-11-10 (×13): 160 mg via ORAL
  Filled 2017-11-06 (×13): qty 2

## 2017-11-06 MED ORDER — INSULIN DETEMIR 100 UNIT/ML ~~LOC~~ SOLN
12.0000 [IU] | Freq: Two times a day (BID) | SUBCUTANEOUS | Status: DC
  Administered 2017-11-06 – 2017-11-07 (×2): 12 [IU] via SUBCUTANEOUS
  Filled 2017-11-06 (×4): qty 0.12

## 2017-11-06 MED ORDER — TAMSULOSIN HCL 0.4 MG PO CAPS
0.4000 mg | ORAL_CAPSULE | Freq: Every day | ORAL | Status: DC
  Administered 2017-11-06 – 2017-11-11 (×6): 0.4 mg via ORAL
  Filled 2017-11-06 (×6): qty 1

## 2017-11-06 MED ORDER — INSULIN ASPART 100 UNIT/ML ~~LOC~~ SOLN
0.0000 [IU] | Freq: Every day | SUBCUTANEOUS | Status: DC
Start: 2017-11-06 — End: 2017-11-11
  Administered 2017-11-09: 2 [IU] via SUBCUTANEOUS

## 2017-11-06 NOTE — Progress Notes (Signed)
PROGRESS NOTE    [name]  ZOX:[id number] DOB: 02/09/1966 DOA: 11/05/2017 PCP: [name], MD    Brief Narrative:  Patient is a 52 year old female history of type 1 diabetes, hypertension, gastroparesis, gastroesophageal reflux disease, hyperlipidemia, depression presented with nausea vomiting mild abdominal discomfort and loose bowel movements x5 days.  On presentation to the ED patient noted to be hyperglycemic, tachycardic and in DKA.  Patient also noted to be hyperkalemic and in acute renal failure with a white count of 21,000 and a hemoglobin of 10.  Urinalysis unremarkable.  Chest x-ray active disease.  Recent admit to stepdown unit and placed on the glucose stabilizer.   Assessment & Plan:   Principal Problem:   DKA (diabetic ketoacidoses) ([place]) Active Problems:   Diabetic neuropathy, type I diabetes mellitus ([place])   Diabetic gastroparesis ([place])   HLD (hyperlipidemia)   GERD (gastroesophageal reflux disease)   Essential hypertension   Leukocytosis   DKA, type 1 ([facility])   Diarrhea   Tachycardia   Severe protein-calorie malnutrition ([facility])   Hyperkalemia   ARF (acute renal failure) ([place])  #1 DKA/type 1 diabetes Questionable etiology.  Patient states was compliant with her medications up until 1 to 2 days prior to admission when she was not feeling well enough to take him.  On admission blood glucose noted to be as high as 775 with a bicarb of less than 7.  Anion gap of > 25.  she is also noted to have a leukocytosis of 21.2.  Urinalysis with greater than 500 glucose, 80 ketones, nitrite negative leukocytes negative.  Chest x-ray negative for any acute infiltrate.  Urinalysis unremarkable.  Patient afebrile.  Patient with some clinical improvement.  Anion gap now at 13.  Bicarb however at 13 with glucose levels at 131.  Continue IV fluids, glucose stabilizer.  Once acidosis has resolved will transition to subcutaneous insulin as well as sliding scale insulin.  Hemoglobin A1c  was 9.1 on June 17, 2017.  Repeat a hemoglobin A1c. IV fluids.  Follow.  2.  Gastroparesis Patient started on erythromycin.  Will continue for now.  3.  Hypertension Systolic blood pressure in the 90s.  Continue IV fluids.  The.  4.  Gastroesophageal reflux disease Pepcid.  Patient complaining of feeling bloated will place on scheduled simethicone.  5.  Anxiety Stable.  Continue BuSpar.  Continue Klonopin.  6.  Diarrhea/loose stools GI pathogen panel pending.  Follow.  7.  Acute renal failure Patient with worsening renal function since admission.  On admission creatinine was 1.91 currently at 2.06.  Renal ultrasound negative for hydronephrosis however with small echogenic kidneys bilaterally compatible with chronic medical renal disease.  Check a urine sodium and urine creatinine.  Continue IV fluids.  Follow.  8.  Hyperkalemia Likely secondary to DKA and acute renal failure.  Improved with hydration.  Resolved.  Follow.  9.  Tachycardia Secondary to problem #1.  Improved.  Follow.  10.  Leukocytosis  Likely a reactive leukocytosis.  Urinalysis negative for UTI.  Chest x-ray negative for any acute infiltrate.  Patient afebrile.  WBC slowly trending down.  No need for antibiotics at this time.  Will follow for now.   DVT prophylaxis: Lovenox Code Status: Full Family Communication: Updated patient.  No family at bedside. Disposition Plan: Remain in stepdown unit.   Consultants:   None  Procedures:   PICC line per IR, Dr. Anselm Pancoast 11/05/2017  Chest x-ray 11/05/2017  Renal ultrasound 11/06/2017  Antimicrobials:   None  Subjective: Patient in bed complaining of feeling gassy.  Denies any chest pain no shortness of breath.  No further nausea or emesis.  Tolerating clear liquids.  Denies any abdominal pain.  States may be having a bowel movement.  Objective: Vitals:   11/06/17 0000 11/06/17 0032 11/06/17 0400 11/06/17 0806  BP: (!) 96/49  (!) 104/51   Pulse: 100  98    Resp: (!) 30  17   Temp:  97.8 F (36.6 C) 97.9 F (36.6 C) 98 F (36.7 C)  TempSrc:  Oral Oral   SpO2: 100%  100%     Intake/Output Summary (Last 24 hours) at 11/06/2017 1031 Last data filed at 11/06/2017 0500 Gross per 24 hour  Intake 165.51 ml  Output 400 ml  Net -234.49 ml   There were no vitals filed for this visit.  Examination:  General exam: Appears calm and comfortable  Respiratory system: Clear to auscultation. Respiratory effort normal. Cardiovascular system: S1 & S2 heard, RRR. No JVD, murmurs, rubs, gallops or clicks. No pedal edema. Gastrointestinal system: Abdomen is nondistended, soft and nontender. No organomegaly or masses felt. Normal bowel sounds heard. Central nervous system: Alert and oriented. No focal neurological deficits. Extremities: Symmetric 5 x 5 power. Skin: No rashes, lesions or ulcers Psychiatry: Judgement and insight appear normal. Mood & affect appropriate.     Data Reviewed: I have personally reviewed following labs and imaging studies  CBC: Recent Labs  Lab 11/05/17 1343 11/06/17 0240  WBC 21.2* 17.1*  NEUTROABS 18.6*  --   HGB 10.0* 8.8*  HCT 33.2* 28.2*  MCV 85.6 80.8  PLT 554* 270*   Basic Metabolic Panel: Recent Labs  Lab 11/05/17 1343 11/05/17 1858 11/06/17 0240 11/06/17 0931  NA 135 140 139 134*  K 6.2* 3.7 3.7 3.7  CL 104 118* 113* 110  CO2 <7* <7* 13* 15*  GLUCOSE 775* 509* 196* 600*  BUN 30* 27* 36* 29*  CREATININE 1.91* 1.53* 2.06* 1.56*  CALCIUM 9.4 6.0* 8.8* 7.7*   GFR: CrCl cannot be calculated (Unknown ideal weight.). Liver Function Tests: Recent Labs  Lab 11/05/17 1343  AST 19  ALT 16  ALKPHOS 90  BILITOT 1.8*  PROT 7.7  ALBUMIN 3.7   Recent Labs  Lab 11/05/17 1343  LIPASE 24   No results for input(s): AMMONIA in the last 168 hours. Coagulation Profile: No results for input(s): INR, PROTIME in the last 168 hours. Cardiac Enzymes: No results for input(s): CKTOTAL, CKMB, CKMBINDEX,  TROPONINI in the last 168 hours. BNP (last 3 results) No results for input(s): PROBNP in the last 8760 hours. HbA1C: No results for input(s): HGBA1C in the last 72 hours. CBG: Recent Labs  Lab 11/06/17 0344 11/06/17 0502 11/06/17 0657 11/06/17 0825 11/06/17 0909  GLUCAP 153* 107* 81 94 131*   Lipid Profile: No results for input(s): CHOL, HDL, LDLCALC, TRIG, CHOLHDL, LDLDIRECT in the last 72 hours. Thyroid Function Tests: No results for input(s): TSH, T4TOTAL, FREET4, T3FREE, THYROIDAB in the last 72 hours. Anemia Panel: No results for input(s): VITAMINB12, FOLATE, FERRITIN, TIBC, IRON, RETICCTPCT in the last 72 hours. Sepsis Labs: No results for input(s): PROCALCITON, LATICACIDVEN in the last 168 hours.  Recent Results (from the past 240 hour(s))  MRSA PCR Screening     Status: None   Collection Time: 11/05/17  6:58 PM  Result Value Ref Range Status   MRSA by PCR NEGATIVE NEGATIVE Final    Comment:        The GeneXpert  MRSA Assay (FDA approved for NASAL specimens only), is one component of a comprehensive MRSA colonization surveillance program. It is not intended to diagnose MRSA infection nor to guide or monitor treatment for MRSA infections. Performed at [facility], [place] [address] [place], [place] [address]          Radiology Studies: US Renal  Result Date: 11/06/2017 CLINICAL DATA:  Acute renal failure EXAM: RENAL / URINARY TRACT ULTRASOUND COMPLETE COMPARISON:  None. FINDINGS: Right Kidney: Length: 9.1 cm.  Increased echotexture.  No mass or hydronephrosis. Left Kidney: Length: 9.9 cm.  Increased echotexture.  No mass or hydronephrosis. Bladder: Appears normal for degree of bladder distention. IMPRESSION: Small, echogenic kidneys bilaterally compatible with chronic medical renal disease. No hydronephrosis. Electronically Signed   By: Rolm Baptise M.D.   On: 11/06/2017 10:09   Dg Chest Port 1 View  Result Date: 11/05/2017 CLINICAL DATA:   Nausea and vomiting EXAM: PORTABLE CHEST 1 VIEW COMPARISON:  06/15/2017 FINDINGS: Left upper extremity catheter tip overlies the distal SVC. No focal opacity or pleural effusion. Normal heart size. No pneumothorax. IMPRESSION: No active disease. Left upper extremity catheter tip overlies the SVC Electronically Signed   By: Donavan Foil M.D.   On: 11/05/2017 20:03   Irpicc Placement Left <5 Yrsinc Img Guide  Result Date: 11/05/2017 INDICATION: 52 year old with dehydration and hyperglycemia.  Poor venous access. EXAM: LEFT ARM PICC LINE PLACEMENT WITH ULTRASOUND AND FLUOROSCOPIC GUIDANCE MEDICATIONS: None ANESTHESIA/SEDATION: None FLUOROSCOPY TIME:  Fluoroscopy Time: 12 seconds, 0.6 mGy COMPLICATIONS: None immediate. PROCEDURE: The patient was advised of the possible risks and complications and agreed to undergo the procedure. The patient was then brought to the angiographic suite for the procedure. The left arm was prepped with chlorhexidine, draped in the usual sterile fashion using maximum barrier technique (cap and mask, sterile gown, sterile gloves, large sterile sheet, hand hygiene and cutaneous antisepsis) and infiltrated locally with 1% Lidocaine. Ultrasound demonstrated patency of the left brachial vein, and this was documented with an image. Under real-time ultrasound guidance, this vein was accessed with a 21 gauge micropuncture needle and image documentation was performed. A 0.018 wire was introduced in to the vein. Over this, a 5 [place] dual lumen power injectable PICC was advanced to the lower SVC/right atrial junction. Fluoroscopy during the procedure and fluoro spot radiograph confirms appropriate catheter position. The catheter was flushed and covered with a sterile dressing. Catheter length: 37 cm IMPRESSION: Successful left arm Power PICC line placement with ultrasound and fluoroscopic guidance. The catheter is ready for use. Electronically Signed   By: Markus Daft M.D.   On: 11/05/2017 17:50     [place] Ekg Site Rite  Result Date: 11/05/2017 If Site Rite image not attached, placement could not be confirmed due to current cardiac rhythm.       Scheduled Meds: . busPIRone  10 mg Oral TID  . clonazePAM  0.5 mg Oral BID  . enoxaparin (LOVENOX) injection  40 mg Subcutaneous Q24H  . erythromycin ethylsuccinate  200 mg Oral Q6H  . famotidine  20 mg Oral QHS  . mirtazapine  15 mg Oral QHS  . pregabalin  150 mg Oral Daily  . pyridostigmine  30 mg Oral TID  . simethicone  160 mg Oral TID   Continuous Infusions: . sodium chloride    . dextrose 5 % and 0.45% NaCl 125 mL/hr at 11/06/17 0845  . insulin (NOVOLIN-R) infusion 0.4 Units/hr (11/06/17 0808)     LOS: 1  day    Time spent: 40 minutes    Irine Seal, MD [facility] Pager [phone]  If 7PM-7AM, please contact night-coverage [email or web address] Password [facility] 11/06/2017, 10:31 AM

## 2017-11-06 NOTE — Progress Notes (Signed)
Inpatient Diabetes Program Recommendations  AACE/ADA: New Consensus Statement on Inpatient Glycemic Control (2015)  Target Ranges:  Prepandial:   less than 140 mg/dL      Peak postprandial:   less than 180 mg/dL (1-2 hours)      Critically ill patients:  140 - 180 mg/dL   Lab Results  Component Value Date   GLUCAP 204 (H) 11/06/2017   HGBA1C 9.1 (H) 06/17/2017    Review of Glycemic Control  Admit with: DKA/ Gastroparesis  History: Type 1 DM, Gastroparesis  Home DM Meds: Levemir 12 units BID                             Novolog 0-12 units TID AC + HS  Current Insulin Orders: IV Insulin drip  Inpatient Diabetes Program Recommendations:    Well-know to Inpatient Diabetes Team - 7 admissions in past 6 months  MD- When patient ready to transition to SQ insulin (CO2 closer to 20), please make sure patient receives Levemir 1-2 hours prior to d/c of IV insulin drip. (Levemir 8 units Q24H)  Will also need Novolog Sensitive SSI Q4H x 12H, then tidwc and hs.  Will follow closely.  Thank you. Lorenda Peck, RD, LDN, CDE Inpatient Diabetes Coordinator [phone]

## 2017-11-07 ENCOUNTER — Other Ambulatory Visit: Payer: Self-pay

## 2017-11-07 ENCOUNTER — Encounter (HOSPITAL_COMMUNITY): Payer: Self-pay | Admitting: *Deleted

## 2017-11-07 LAB — BASIC METABOLIC PANEL
ANION GAP: 8 (ref 5–15)
Anion gap: 9 (ref 5–15)
BUN: 22 mg/dL — ABNORMAL HIGH (ref 6–20)
BUN: 18 mg/dL (ref 6–20)
CALCIUM: 8.2 mg/dL — AB (ref 8.9–10.3)
CHLORIDE: 115 mmol/L — AB (ref 101–111)
CO2: 16 mmol/L — ABNORMAL LOW (ref 22–32)
CO2: 16 mmol/L — ABNORMAL LOW (ref 22–32)
CREATININE: 1.25 mg/dL — AB (ref 0.44–1.00)
CREATININE: 1.31 mg/dL — AB (ref 0.44–1.00)
Calcium: 8.4 mg/dL — ABNORMAL LOW (ref 8.9–10.3)
Chloride: 116 mmol/L — ABNORMAL HIGH (ref 101–111)
GFR calc Af Amer: 57 mL/min — ABNORMAL LOW (ref >60)
GFR calc non Af Amer: 47 mL/min — ABNORMAL LOW (ref >60)
GFR, EST AFRICAN AMERICAN: 54 mL/min — AB (ref >60)
GFR, EST NON AFRICAN AMERICAN: 49 mL/min — AB (ref >60)
GLUCOSE: 100 mg/dL — AB (ref 65–99)
Glucose, Bld: 159 mg/dL — ABNORMAL HIGH (ref 65–99)
POTASSIUM: 3.2 mmol/L — AB (ref 3.5–5.1)
Potassium: 3.8 mmol/L (ref 3.5–5.1)
SODIUM: 140 mmol/L (ref 135–145)
Sodium: 140 mmol/L (ref 135–145)

## 2017-11-07 LAB — CBC WITH DIFFERENTIAL/PLATELET
BASOS ABS: 0 10*3/uL (ref 0.0–0.1)
BASOS PCT: 0 %
Eosinophils Absolute: 0 10*3/uL (ref 0.0–0.7)
Eosinophils Relative: 0 %
HEMATOCRIT: 25.3 % — AB (ref 36.0–46.0)
Hemoglobin: 8.2 g/dL — ABNORMAL LOW (ref 12.0–15.0)
Lymphocytes Relative: 11 %
Lymphs Abs: 1.2 10*3/uL (ref 0.7–4.0)
MCH: 25.6 pg — ABNORMAL LOW (ref 26.0–34.0)
MCHC: 32.4 g/dL (ref 30.0–36.0)
MCV: 79.1 fL (ref 78.0–100.0)
MONO ABS: 0.6 10*3/uL (ref 0.1–1.0)
Monocytes Relative: 6 %
NEUTROS ABS: 9.1 10*3/uL — AB (ref 1.7–7.7)
Neutrophils Relative %: 83 %
Platelets: 276 10*3/uL (ref 150–400)
RBC: 3.2 MIL/uL — ABNORMAL LOW (ref 3.87–5.11)
RDW: 14.8 % (ref 11.5–15.5)
WBC: 11 10*3/uL — ABNORMAL HIGH (ref 4.0–10.5)

## 2017-11-07 LAB — GLUCOSE, CAPILLARY
GLUCOSE-CAPILLARY: 98 mg/dL (ref 65–99)
GLUCOSE-CAPILLARY: 48 mg/dL — AB (ref 65–99)
GLUCOSE-CAPILLARY: 125 mg/dL — AB (ref 65–99)
Glucose-Capillary: 92 mg/dL (ref 65–99)
Glucose-Capillary: 97 mg/dL (ref 65–99)
Glucose-Capillary: 95 mg/dL (ref 65–99)

## 2017-11-07 LAB — HEMOGLOBIN A1C
HEMOGLOBIN A1C: 10.6 % — AB (ref 4.8–5.6)
MEAN PLASMA GLUCOSE: 257.52 mg/dL

## 2017-11-07 MED ORDER — SODIUM CHLORIDE 0.9 % IV SOLN
1000.0000 mL | INTRAVENOUS | Status: DC
  Administered 2017-11-07 – 2017-11-08 (×2): 1000 mL via INTRAVENOUS

## 2017-11-07 MED ORDER — SODIUM BICARBONATE 650 MG PO TABS
650.0000 mg | ORAL_TABLET | Freq: Two times a day (BID) | ORAL | Status: AC
  Administered 2017-11-07 – 2017-11-08 (×3): 650 mg via ORAL
  Filled 2017-11-07 (×3): qty 1

## 2017-11-07 MED ORDER — LOPERAMIDE HCL 2 MG PO CAPS
2.0000 mg | ORAL_CAPSULE | ORAL | Status: DC | PRN
Start: 2017-11-07 — End: 2017-11-11
  Administered 2017-11-07 (×2): 2 mg via ORAL
  Filled 2017-11-07 (×2): qty 1

## 2017-11-07 MED ORDER — INSULIN DETEMIR 100 UNIT/ML ~~LOC~~ SOLN
10.0000 [IU] | Freq: Two times a day (BID) | SUBCUTANEOUS | Status: DC
  Administered 2017-11-07: 10 [IU] via SUBCUTANEOUS
  Filled 2017-11-07 (×2): qty 0.1

## 2017-11-07 MED ORDER — SODIUM CHLORIDE 0.9 % IV SOLN
1000.0000 mL | INTRAVENOUS | Status: DC
  Administered 2017-11-07: 1000 mL via INTRAVENOUS

## 2017-11-07 MED ORDER — LOPERAMIDE HCL 2 MG PO CAPS
4.0000 mg | ORAL_CAPSULE | Freq: Once | ORAL | Status: AC
  Administered 2017-11-07: 4 mg via ORAL
  Filled 2017-11-07: qty 2

## 2017-11-07 MED ORDER — SODIUM CHLORIDE 0.9 % IV SOLN: 1000.0000 mL | INTRAVENOUS | Status: DC

## 2017-11-07 MED ORDER — ACETAMINOPHEN 325 MG PO TABS
650.0000 mg | ORAL_TABLET | ORAL | Status: DC | PRN
  Administered 2017-11-07: 650 mg via ORAL
  Filled 2017-11-07: qty 2

## 2017-11-07 MED ORDER — ERYTHROMYCIN BASE 250 MG PO TABS
250.0000 mg | ORAL_TABLET | Freq: Three times a day (TID) | ORAL | Status: DC
  Administered 2017-11-07: 250 mg via ORAL
  Filled 2017-11-07 (×2): qty 1

## 2017-11-07 MED ORDER — SODIUM CHLORIDE 0.9 % IV BOLUS
1000.0000 mL | Freq: Once | INTRAVENOUS | Status: AC
  Administered 2017-11-07: 1000 mL via INTRAVENOUS

## 2017-11-07 MED ORDER — DEXTROSE 50 % IV SOLN
INTRAVENOUS | Status: AC
  Filled 2017-11-07: qty 50

## 2017-11-07 NOTE — Progress Notes (Signed)
PROGRESS NOTE    [name]  QBH:[id number] DOB: [birth year] DOA: 11/05/2017 PCP: [name], MD    Brief Narrative:  Patient is a 52 year old female history of type 1 diabetes, hypertension, gastroparesis, gastroesophageal reflux disease, hyperlipidemia, depression presented with nausea vomiting mild abdominal discomfort and loose bowel movements x5 days.  On presentation to the ED patient noted to be hyperglycemic, tachycardic and in DKA.  Patient also noted to be hyperkalemic and in acute renal failure with a white count of 21,000 and a hemoglobin of 10.  Urinalysis unremarkable.  Chest x-ray active disease.  Recent admit to stepdown unit and placed on the glucose stabilizer.   Assessment & Plan:   Principal Problem:   DKA (diabetic ketoacidoses) ([place]) Active Problems:   Diabetic neuropathy, type I diabetes mellitus ([place])   Diabetic gastroparesis ([place])   HLD (hyperlipidemia)   GERD (gastroesophageal reflux disease)   Essential hypertension   Leukocytosis   DKA, type 1 ([facility])   Diarrhea   Tachycardia   Severe protein-calorie malnutrition ([facility])   Hyperkalemia   ARF (acute renal failure) ([place])  #1 DKA/type 1 diabetes Questionable etiology.  Patient states was compliant with her medications up until 1 to 2 days prior to admission when she was not feeling well enough to take him.  On admission blood glucose noted to be as high as 775 with a bicarb of less than 7.  Anion gap of > 25.  she is also noted to have a leukocytosis of 21.2.  Urinalysis with greater than 500 glucose, 80 ketones, nitrite negative leukocytes negative.  Chest x-ray negative for any acute infiltrate.  Urinalysis unremarkable.  Patient afebrile.  Patient improved clinically.  Anion gap closed.  Patient was transitioned to long-acting subcutaneous insulin and sliding scale insulin.  CBGs in the 90s.  Will decrease patient's Levemir to 10 units twice daily.  Continue sliding scale insulin.  Hemoglobin A1c is 10.6.   Diabetes education.  Follow.  2.  Gastroparesis Patient started on erythromycin.  Patient now with multiple loose stools.  Will discontinue erythromycin.  Patient unable to tolerate Reglan.  Follow.   3.  Hypertension Borderline blood pressure improving with fluids.  Follow.   4.  Gastroesophageal reflux disease Pepcid.  Continue simethicone.   5.  Anxiety Continue BuSpar and Klonopin.    6.  Diarrhea/loose stools GI pathogen panel negative.  Patient complained of multiple loose stools.  Patient was placed on erythromycin for gastroparesis which we will discontinue.  Imodium 4 mg p.o. x1.  Imodium 2 mg as needed.  Give a bolus of IV fluids.  Continue IV fluids.  Follow.   7.  Acute renal failure Patient with worsening renal function since admission.  On admission creatinine was 1.91 moved up to 2.06.  Likely secondary to a prerenal azotemia.  Renal function trending back down and improved with hydration.  Creatinine currently at 1.25. Renal ultrasound negative for hydronephrosis however with small echogenic kidneys bilaterally compatible with chronic medical renal disease.  Continue hydration with IV fluids and follow.   8.  Hyperkalemia Likely secondary to DKA and acute renal failure.  Resolved with correction of diabetic ketoacidosis.  Follow.    9.  Tachycardia Secondary to problem #1.  Improving with hydration.  Follow.  10.  Leukocytosis  Likely a reactive leukocytosis.  Urinalysis negative for UTI.  Chest x-ray negative for any acute infiltrate.  Patient afebrile.  GI panel negative.  WBC trending down.  No need for antibiotics.  Follow.  DVT prophylaxis: Lovenox Code Status: Full Family Communication: Updated patient.  No family at bedside. Disposition Plan: Transfer to [facility].    Consultants:   None  Procedures:   PICC line per IR, Dr. Anselm Pancoast 11/05/2017  Chest x-ray 11/05/2017  Renal ultrasound 11/06/2017  Antimicrobials:   Erythromycin 11/05/2017>>>>>  11/07/2017   Subjective: Patient complaining of multiple loose stools.  Feels everything she eats comes right out.  Tolerating current diet.  No chest pain.  No shortness of breath.   Objective: Vitals:   11/07/17 0354 11/07/17 0400 11/07/17 0654 11/07/17 0706  BP:  (!) 130/38 (!) 145/86   Pulse:  96 94   Resp:  13 18   Temp: 97.6 F (36.4 C)  97.7 F (36.5 C)   TempSrc: Oral  Oral   SpO2:  100% 100%   Weight:    68.3 kg (150 lb 9.6 oz)    Intake/Output Summary (Last 24 hours) at 11/07/2017 1139 Last data filed at 11/07/2017 0530 Gross per 24 hour  Intake 2085.3 ml  Output 725 ml  Net 1360.3 ml   Filed Weights   11/07/17 0706  Weight: 68.3 kg (150 lb 9.6 oz)    Examination:  General exam: Appears calm and comfortable  Respiratory system: Lungs clear to auscultation bilaterally.  No wheezes, no crackles, no rhonchi.  Respiratory effort normal. Cardiovascular system: Regular rate and rhythm no murmurs rubs or gallops.  No JVD.  No lower extremity edema. Gastrointestinal system: Abdomen is soft, nontender, nondistended, positive bowel sounds.  No rebound.  No guarding.  Central nervous system: Alert and oriented. No focal neurological deficits. Extremities: Symmetric 5 x 5 power. Skin: No rashes, lesions or ulcers Psychiatry: Judgement and insight appear normal. Mood & affect appropriate.     Data Reviewed: I have personally reviewed following labs and imaging studies  CBC: Recent Labs  Lab 11/05/17 1343 11/06/17 0240 11/07/17 0530  WBC 21.2* 17.1* 11.0*  NEUTROABS 18.6*  --  9.1*  HGB 10.0* 8.8* 8.2*  HCT 33.2* 28.2* 25.3*  MCV 85.6 80.8 79.1  PLT 554* 404* 176   Basic Metabolic Panel: Recent Labs  Lab 11/06/17 0931 11/06/17 1052 11/06/17 1550 11/07/17 0036 11/07/17 0530  NA 134* 139 141 140 140  K 3.7 4.0 3.9 3.2* 3.8  CL 110 114* 116* 115* 116*  CO2 15* 15* 19* 16* 16*  GLUCOSE 600* 246* 149* 159* 100*  BUN 29* 29* 27* 22* 18  CREATININE 1.56*  1.62* 1.45* 1.31* 1.25*  CALCIUM 7.7* 8.1* 8.3* 8.4* 8.2*   GFR: Estimated Creatinine Clearance: 48.8 mL/min (A) (by C-G formula based on SCr of 1.25 mg/dL (H)). Liver Function Tests: Recent Labs  Lab 11/05/17 1343  AST 19  ALT 16  ALKPHOS 90  BILITOT 1.8*  PROT 7.7  ALBUMIN 3.7   Recent Labs  Lab 11/05/17 1343  LIPASE 24   No results for input(s): AMMONIA in the last 168 hours. Coagulation Profile: No results for input(s): INR, PROTIME in the last 168 hours. Cardiac Enzymes: No results for input(s): CKTOTAL, CKMB, CKMBINDEX, TROPONINI in the last 168 hours. BNP (last 3 results) No results for input(s): PROBNP in the last 8760 hours. HbA1C: Recent Labs    11/07/17 0530  HGBA1C 10.6*   CBG: Recent Labs  Lab 11/06/17 2129 11/06/17 2228 11/06/17 2329 11/07/17 0324 11/07/17 0750  GLUCAP 304* 261* 200* 98 92   Lipid Profile: No results for input(s): CHOL, HDL, LDLCALC, TRIG, CHOLHDL, LDLDIRECT in the last 72 hours.  Thyroid Function Tests: No results for input(s): TSH, T4TOTAL, FREET4, T3FREE, THYROIDAB in the last 72 hours. Anemia Panel: No results for input(s): VITAMINB12, FOLATE, FERRITIN, TIBC, IRON, RETICCTPCT in the last 72 hours. Sepsis Labs: No results for input(s): PROCALCITON, LATICACIDVEN in the last 168 hours.  Recent Results (from the past 240 hour(s))  MRSA PCR Screening     Status: None   Collection Time: 11/05/17  6:58 PM  Result Value Ref Range Status   MRSA by PCR NEGATIVE NEGATIVE Final    Comment:        The GeneXpert MRSA Assay (FDA approved for NASAL specimens only), is one component of a comprehensive MRSA colonization surveillance program. It is not intended to diagnose MRSA infection nor to guide or monitor treatment for MRSA infections. Performed at [facility], [place] [address] [place], [place] [address]   Gastrointestinal Panel by PCR , Stool     Status: None   Collection Time: 11/06/17  9:31 AM  Result  Value Ref Range Status   Campylobacter species NOT DETECTED NOT DETECTED Final   Plesimonas shigelloides NOT DETECTED NOT DETECTED Final   Salmonella species NOT DETECTED NOT DETECTED Final   Yersinia enterocolitica NOT DETECTED NOT DETECTED Final   Vibrio species NOT DETECTED NOT DETECTED Final   Vibrio cholerae NOT DETECTED NOT DETECTED Final   Enteroaggregative E coli (EAEC) NOT DETECTED NOT DETECTED Final   Enteropathogenic E coli (EPEC) NOT DETECTED NOT DETECTED Final   Enterotoxigenic E coli (ETEC) NOT DETECTED NOT DETECTED Final   Shiga like toxin producing E coli (STEC) NOT DETECTED NOT DETECTED Final   Shigella/Enteroinvasive E coli (EIEC) NOT DETECTED NOT DETECTED Final   Cryptosporidium NOT DETECTED NOT DETECTED Final   Cyclospora cayetanensis NOT DETECTED NOT DETECTED Final   Entamoeba histolytica NOT DETECTED NOT DETECTED Final   Giardia lamblia NOT DETECTED NOT DETECTED Final   Adenovirus F40/41 NOT DETECTED NOT DETECTED Final   Astrovirus NOT DETECTED NOT DETECTED Final   Norovirus GI/GII NOT DETECTED NOT DETECTED Final   Rotavirus A NOT DETECTED NOT DETECTED Final   Sapovirus (I, II, IV, and V) NOT DETECTED NOT DETECTED Final    Comment: Performed at [facility], [address] [place], [place] [address]         Radiology Studies: US Renal  Result Date: 11/06/2017 CLINICAL DATA:  Acute renal failure EXAM: RENAL / URINARY TRACT ULTRASOUND COMPLETE COMPARISON:  None. FINDINGS: Right Kidney: Length: 9.1 cm.  Increased echotexture.  No mass or hydronephrosis. Left Kidney: Length: 9.9 cm.  Increased echotexture.  No mass or hydronephrosis. Bladder: Appears normal for degree of bladder distention. IMPRESSION: Small, echogenic kidneys bilaterally compatible with chronic medical renal disease. No hydronephrosis. Electronically Signed   By: Rolm Baptise M.D.   On: 11/06/2017 10:09   Dg Chest Port 1 View  Result Date: 11/05/2017 CLINICAL DATA:  Nausea and vomiting  EXAM: PORTABLE CHEST 1 VIEW COMPARISON:  06/15/2017 FINDINGS: Left upper extremity catheter tip overlies the distal SVC. No focal opacity or pleural effusion. Normal heart size. No pneumothorax. IMPRESSION: No active disease. Left upper extremity catheter tip overlies the SVC Electronically Signed   By: Donavan Foil M.D.   On: 11/05/2017 20:03   Irpicc Placement Left <5 Yrsinc Img Guide  Result Date: 11/05/2017 INDICATION: 52 year old with dehydration and hyperglycemia.  Poor venous access. EXAM: LEFT ARM PICC LINE PLACEMENT WITH ULTRASOUND AND FLUOROSCOPIC GUIDANCE MEDICATIONS: None ANESTHESIA/SEDATION: None FLUOROSCOPY TIME:  Fluoroscopy Time: 12 seconds,  0.6 mGy COMPLICATIONS: None immediate. PROCEDURE: The patient was advised of the possible risks and complications and agreed to undergo the procedure. The patient was then brought to the angiographic suite for the procedure. The left arm was prepped with chlorhexidine, draped in the usual sterile fashion using maximum barrier technique (cap and mask, sterile gown, sterile gloves, large sterile sheet, hand hygiene and cutaneous antisepsis) and infiltrated locally with 1% Lidocaine. Ultrasound demonstrated patency of the left brachial vein, and this was documented with an image. Under real-time ultrasound guidance, this vein was accessed with a 21 gauge micropuncture needle and image documentation was performed. A 0.018 wire was introduced in to the vein. Over this, a 5 [place] dual lumen power injectable PICC was advanced to the lower SVC/right atrial junction. Fluoroscopy during the procedure and fluoro spot radiograph confirms appropriate catheter position. The catheter was flushed and covered with a sterile dressing. Catheter length: 37 cm IMPRESSION: Successful left arm Power PICC line placement with ultrasound and fluoroscopic guidance. The catheter is ready for use. Electronically Signed   By: Markus Daft M.D.   On: 11/05/2017 17:50   [place] Ekg Site  Rite  Result Date: 11/05/2017 If Site Rite image not attached, placement could not be confirmed due to current cardiac rhythm.       Scheduled Meds: . busPIRone  10 mg Oral TID  . clonazePAM  0.5 mg Oral BID  . enoxaparin (LOVENOX) injection  40 mg Subcutaneous Q24H  . erythromycin  250 mg Oral TID AC  . famotidine  20 mg Oral QHS  . insulin aspart  0-5 Units Subcutaneous QHS  . insulin aspart  0-9 Units Subcutaneous TID WC  . insulin detemir  12 Units Subcutaneous BID  . loperamide  4 mg Oral Once  . mirtazapine  15 mg Oral QHS  . pregabalin  150 mg Oral Daily  . pyridostigmine  30 mg Oral TID  . simethicone  160 mg Oral TID  . sodium bicarbonate  650 mg Oral BID  . tamsulosin  0.4 mg Oral QPC supper   Continuous Infusions: . sodium chloride 1,000 mL (11/07/17 0814)     LOS: 2 days    Time spent: 40 minutes    Irine Seal, MD [facility] Pager [phone]  If 7PM-7AM, please contact night-coverage [email or web address] Password [facility] 11/07/2017, 11:39 AM

## 2017-11-07 NOTE — Progress Notes (Signed)
CRITICAL VALUE ALERT  Critical Value:  Glucose 48  Date & Time Notied:  11/07/17 1700  Provider Notified: Grandville Silos  Orders Received/Actions taken: pt treated per hypoglycemic protocol. Initial report from NT pt reported feeling dizzy. Pt request to to be given orange juice and graham crackers, will repeat glucose check. CBG repeated 95

## 2017-11-08 LAB — GLUCOSE, CAPILLARY
GLUCOSE-CAPILLARY: 140 mg/dL — AB (ref 65–99)
GLUCOSE-CAPILLARY: 142 mg/dL — AB (ref 65–99)
GLUCOSE-CAPILLARY: 190 mg/dL — AB (ref 65–99)
GLUCOSE-CAPILLARY: 146 mg/dL — AB (ref 65–99)
Glucose-Capillary: 95 mg/dL (ref 65–99)
Glucose-Capillary: 34 mg/dL — CL (ref 65–99)
Glucose-Capillary: 39 mg/dL — CL (ref 65–99)
Glucose-Capillary: 167 mg/dL — ABNORMAL HIGH (ref 65–99)

## 2017-11-08 LAB — BASIC METABOLIC PANEL
Anion gap: 8 (ref 5–15)
BUN: 12 mg/dL (ref 6–20)
CO2: 16 mmol/L — AB (ref 22–32)
Calcium: 7.4 mg/dL — ABNORMAL LOW (ref 8.9–10.3)
Chloride: 115 mmol/L — ABNORMAL HIGH (ref 101–111)
Creatinine, Ser: 1.15 mg/dL — ABNORMAL HIGH (ref 0.44–1.00)
GFR calc Af Amer: >60 mL/min (ref >60)
GFR calc non Af Amer: 55 mL/min — ABNORMAL LOW (ref >60)
GLUCOSE: 171 mg/dL — AB (ref 65–99)
POTASSIUM: 3.7 mmol/L (ref 3.5–5.1)
Sodium: 139 mmol/L (ref 135–145)

## 2017-11-08 LAB — RETICULOCYTES
RBC.: 3.19 MIL/uL — ABNORMAL LOW (ref 3.87–5.11)
RETIC COUNT ABSOLUTE: 35.1 10*3/uL (ref 19.0–186.0)
RETIC CT PCT: 1.1 % (ref 0.4–3.1)

## 2017-11-08 LAB — CBC WITH DIFFERENTIAL/PLATELET
BASOS ABS: 0 10*3/uL (ref 0.0–0.1)
Basophils Relative: 0 %
Eosinophils Absolute: 0.1 10*3/uL (ref 0.0–0.7)
Eosinophils Relative: 2 %
HEMATOCRIT: 23.7 % — AB (ref 36.0–46.0)
Hemoglobin: 7.5 g/dL — ABNORMAL LOW (ref 12.0–15.0)
LYMPHS PCT: 22 %
Lymphs Abs: 1.1 10*3/uL (ref 0.7–4.0)
MCH: 25.8 pg — ABNORMAL LOW (ref 26.0–34.0)
MCHC: 31.6 g/dL (ref 30.0–36.0)
MCV: 81.4 fL (ref 78.0–100.0)
MONO ABS: 0.5 10*3/uL (ref 0.1–1.0)
Monocytes Relative: 10 %
NEUTROS ABS: 3.2 10*3/uL (ref 1.7–7.7)
Neutrophils Relative %: 66 %
Platelets: 245 10*3/uL (ref 150–400)
RBC: 2.91 MIL/uL — AB (ref 3.87–5.11)
RDW: 15.3 % (ref 11.5–15.5)
WBC: 4.8 10*3/uL (ref 4.0–10.5)

## 2017-11-08 LAB — IRON AND TIBC
IRON: 16 ug/dL — AB (ref 28–170)
Saturation Ratios: 5 % — ABNORMAL LOW (ref 10.4–31.8)
TIBC: 336 ug/dL (ref 250–450)
UIBC: 320 ug/dL

## 2017-11-08 LAB — URINE CULTURE: Culture: NO GROWTH

## 2017-11-08 LAB — FERRITIN: Ferritin: 7 ng/mL — ABNORMAL LOW (ref 11–307)

## 2017-11-08 LAB — FOLATE: Folate: 13.6 ng/mL (ref >5.9)

## 2017-11-08 LAB — MAGNESIUM: MAGNESIUM: 1.5 mg/dL — AB (ref 1.7–2.4)

## 2017-11-08 LAB — VITAMIN B12: VITAMIN B 12: 160 pg/mL — AB (ref 180–914)

## 2017-11-08 MED ORDER — SODIUM CHLORIDE 0.9% FLUSH
10.0000 mL | INTRAVENOUS | Status: DC | PRN
  Administered 2017-11-08 – 2017-11-11 (×2): 10 mL
  Filled 2017-11-08 (×2): qty 40

## 2017-11-08 MED ORDER — DEXTROSE 50 % IV SOLN
INTRAVENOUS | Status: AC
  Filled 2017-11-08: qty 50

## 2017-11-08 MED ORDER — FUROSEMIDE 10 MG/ML IJ SOLN
20.0000 mg | Freq: Once | INTRAMUSCULAR | Status: AC
Start: 2017-11-08 — End: 2017-11-08
  Administered 2017-11-08: 20 mg via INTRAVENOUS
  Filled 2017-11-08: qty 2

## 2017-11-08 MED ORDER — INSULIN DETEMIR 100 UNIT/ML ~~LOC~~ SOLN
5.0000 [IU] | Freq: Two times a day (BID) | SUBCUTANEOUS | Status: DC
  Administered 2017-11-08 – 2017-11-09 (×3): 5 [IU] via SUBCUTANEOUS
  Filled 2017-11-08 (×3): qty 0.05

## 2017-11-08 MED ORDER — DEXTROSE 50 % IV SOLN
12.5000 g | Freq: Once | INTRAVENOUS | Status: AC
  Administered 2017-11-08: 12.5 g via INTRAVENOUS

## 2017-11-08 MED ORDER — SODIUM CHLORIDE 0.9% FLUSH
10.0000 mL | Freq: Two times a day (BID) | INTRAVENOUS | Status: DC
  Administered 2017-11-10 – 2017-11-11 (×2): 10 mL

## 2017-11-08 MED ORDER — SODIUM CHLORIDE 0.9 % IV SOLN
1000.0000 mL | INTRAVENOUS | Status: DC
  Administered 2017-11-08: 1000 mL via INTRAVENOUS

## 2017-11-08 MED ORDER — SODIUM CHLORIDE 0.9 % IV BOLUS
1000.0000 mL | Freq: Once | INTRAVENOUS | Status: AC
  Administered 2017-11-08: 1000 mL via INTRAVENOUS

## 2017-11-08 MED ORDER — SODIUM CHLORIDE 0.9 % IV SOLN
510.0000 mg | Freq: Once | INTRAVENOUS | Status: AC
  Administered 2017-11-08: 510 mg via INTRAVENOUS
  Filled 2017-11-08: qty 17

## 2017-11-08 MED ORDER — CYANOCOBALAMIN 1000 MCG/ML IJ SOLN
1000.0000 ug | Freq: Every day | INTRAMUSCULAR | Status: DC
  Filled 2017-11-08: qty 1

## 2017-11-08 MED ORDER — LORATADINE 10 MG PO TABS
10.0000 mg | ORAL_TABLET | Freq: Every day | ORAL | Status: DC
  Administered 2017-11-08 – 2017-11-11 (×4): 10 mg via ORAL
  Filled 2017-11-08 (×5): qty 1

## 2017-11-08 MED ORDER — CYANOCOBALAMIN 1000 MCG/ML IJ SOLN
1000.0000 ug | Freq: Every day | INTRAMUSCULAR | Status: DC
  Administered 2017-11-08 – 2017-11-11 (×4): 1000 ug via INTRAMUSCULAR
  Filled 2017-11-08 (×4): qty 1

## 2017-11-08 MED ORDER — FLUTICASONE PROPIONATE 50 MCG/ACT NA SUSP
2.0000 | Freq: Every day | NASAL | Status: DC
  Administered 2017-11-08 – 2017-11-11 (×2): 2 via NASAL
  Filled 2017-11-08: qty 16

## 2017-11-08 NOTE — Progress Notes (Signed)
Hypoglycemic Event  CBG: 34  Treatment: 15g carbohydrates (orange juice)  Symptoms: None  Follow-up CBG: Time 0505 CBG Result:39  Possible Reasons for Event: Change in medications   Comments/MD notified:NP on call notified.     [name]

## 2017-11-08 NOTE — Progress Notes (Signed)
Hypoglycemic Event  CBG: 39  Treatment: 12.5g dextrose   Symptoms: None  Follow-up CBG: Time:0530 CBG Result:140  Possible Reasons for Event: Change in medication   Comments/MD notified:NP on call notified     [name] S Ritha Sampedro

## 2017-11-08 NOTE — Progress Notes (Addendum)
Pt urine output 33ml over the last hour with fluids running at 139ml/h. Current BP 92/49. NP on call notified. Awaiting orders. Will continue to monitor.    ---Orders placed for 1L fluid bolus.

## 2017-11-08 NOTE — Progress Notes (Signed)
Inpatient Diabetes Program Recommendations  AACE/ADA: New Consensus Statement on Inpatient Glycemic Control (2015)  Target Ranges:  Prepandial:   less than 140 mg/dL      Peak postprandial:   less than 180 mg/dL (1-2 hours)      Critically ill patients:  140 - 180 mg/dL   Lab Results  Component Value Date   GLUCAP 142 (H) 11/08/2017   HGBA1C 10.6 (H) 11/07/2017    Review of Glycemic Control  Continues with hypoglycemia.  Basal insulin reduced. Home dose is Levemir 12 units bid. Was hypoglycemic with home dose. Pt will probably need more than 6 units bid.  Inpatient Diabetes Program Recommendations:    Start with Levemir 8 units bid. If po intake > 50%, add Novolog 2 units tidwc.   Continue to follow.   Thank you. Lorenda Peck, RD, LDN, CDE Inpatient Diabetes Coordinator [phone]

## 2017-11-08 NOTE — Progress Notes (Signed)
PROGRESS NOTE    [name]  NLG:[id number] DOB: 05-Jun-1966 DOA: 11/05/2017 PCP: [name], MD    Brief Narrative:  Patient is a 52 year old female history of type 1 diabetes, hypertension, gastroparesis, gastroesophageal reflux disease, hyperlipidemia, depression presented with nausea vomiting mild abdominal discomfort and loose bowel movements x5 days.  On presentation to the ED patient noted to be hyperglycemic, tachycardic and in DKA.  Patient also noted to be hyperkalemic and in acute renal failure with a white count of 21,000 and a hemoglobin of 10.  Urinalysis unremarkable.  Chest x-ray active disease.  Recent admit to stepdown unit and placed on the glucose stabilizer.   Assessment & Plan:   Principal Problem:   DKA (diabetic ketoacidoses) ([place]) Active Problems:   Diabetic neuropathy, type I diabetes mellitus ([place])   Diabetic gastroparesis ([place])   HLD (hyperlipidemia)   GERD (gastroesophageal reflux disease)   Essential hypertension   Leukocytosis   DKA, type 1 ([facility])   Diarrhea   Tachycardia   Severe protein-calorie malnutrition ([facility])   Hyperkalemia   ARF (acute renal failure) ([place])  #1 DKA/type 1 diabetes Questionable etiology.  Patient states was compliant with her medications up until 1 to 2 days prior to admission when she was not feeling well enough to take him.  On admission blood glucose noted to be as high as 775 with a bicarb of less than 7.  Anion gap of > 25.  she is also noted to have a leukocytosis of 21.2.  Urinalysis with greater than 500 glucose, 80 ketones, nitrite negative leukocytes negative.  Chest x-ray negative for any acute infiltrate.  Urinalysis unremarkable.  Patient afebrile.  Patient improved clinically.  Anion gap closed.  Patient was transitioned to long-acting subcutaneous insulin and sliding scale insulin. Hemoglobin A1c 10.6 on 11/07/2017.  CBGs noted to be in the 30s this morning as low as 34.  Decrease Levemir to 5 units twice daily.   Continue sliding scale insulin.  Continue diabetes education.   2.  Gastroparesis Patient started on erythromycin.  Patient with multiple loose stools while on erythromycin.  Erythromycin has been discontinued.  Patient unable to tolerate Reglan in the past.  Follow.   3.  Hypertension Blood pressure was borderline and has subsequently improved.  Saline lock IV fluids.  Follow.   4.  Gastroesophageal reflux disease Pepcid.  Continue simethicone.   5.  Anxiety Stable.  Continue BuSpar and Klonopin.    6.  Diarrhea/loose stools GI pathogen panel negative.  Patient complained of multiple loose stools.  Patient was placed on erythromycin for gastroparesis which has since been discontinued as patient was having multiple loose stools.  Continue Imodium as needed.  Saline lock IV fluids.   7.  Acute renal failure Patient with worsening renal function since admission.  On admission creatinine was 1.91 moved up to 2.06.  Likely secondary to a prerenal azotemia.  Renal function trending trended back down and improved.  Creatinine currently at 1.15. Renal ultrasound negative for hydronephrosis however with small echogenic kidneys bilaterally compatible with chronic medical renal disease.  Saline lock IV fluids.   8.  Hyperkalemia Likely secondary to DKA and acute renal failure.  Resolved with correction of diabetic ketoacidosis.  Follow.    9.  Tachycardia Secondary to problem #1.  Improved with hydration.  Saline lock IV fluids.   10.  Leukocytosis  Likely a reactive leukocytosis.  Urinalysis negative for UTI.  Chest x-ray negative for any acute infiltrate.  Patient afebrile.  GI panel negative.  Leukocytosis resolved.  No need for antibiotics.   11.  Anemia Patient with no overt bleeding.  Check an anemia panel.  Follow.   DVT prophylaxis: Lovenox Code Status: Full Family Communication: Updated patient.  No family at bedside. Disposition Plan: Home when clinically improved, blood glucose  levels improved, diarrhea resolved and tolerating oral intake.   Consultants:   None  Procedures:   PICC line per IR, Dr. Anselm Pancoast 11/05/2017  Chest x-ray 11/05/2017  Renal ultrasound 11/06/2017  Antimicrobials:   Erythromycin 11/05/2017>>>>> 11/07/2017   Subjective: Patient sitting on the side of the bed.  States had multiple loose stools yesterday afternoon and yesterday evening.  No loose stools this morning.  Patient noted to have CBGs in the 30s early this morning.  Patient denies any shortness of breath or chest pain.   Objective: Vitals:   11/08/17 0202 11/08/17 0322 11/08/17 0451 11/08/17 0452  BP: (!) 92/46 (!) 100/52 (!) 121/52   Pulse: 86 86 95   Resp:   18   Temp: 98.4 F (36.9 C)  97.6 F (36.4 C)   TempSrc: Oral  Oral   SpO2:   95%   Weight:    70.3 kg (154 lb 14.4 oz)  Height:        Intake/Output Summary (Last 24 hours) at 11/08/2017 0936 Last data filed at 11/08/2017 0915 Gross per 24 hour  Intake 2415 ml  Output 676 ml  Net 1739 ml   Filed Weights   11/07/17 0706 11/08/17 0452  Weight: 68.3 kg (150 lb 9.6 oz) 70.3 kg (154 lb 14.4 oz)    Examination:  General exam: NAD Respiratory system: CTAB.  No wheezes, no crackles, no rhonchi.  Normal respiratory effort.   Cardiovascular system: RRR no murmurs rubs or gallops.  No JVD.  No pitting edema.  Gastrointestinal system: Abdomen is nontender, nondistended, soft, positive bowel sounds.  No rebound.  No guarding.   Central nervous system: Alert and oriented. No focal neurological deficits. Extremities: Symmetric 5 x 5 power. Skin: No rashes, lesions or ulcers Psychiatry: Judgement and insight appear normal. Mood & affect appropriate.     Data Reviewed: I have personally reviewed following labs and imaging studies  CBC: Recent Labs  Lab 11/05/17 1343 11/06/17 0240 11/07/17 0530 11/08/17 0606  WBC 21.2* 17.1* 11.0* 4.8  NEUTROABS 18.6*  --  9.1* 3.2  HGB 10.0* 8.8* 8.2* 7.5*  HCT 33.2* 28.2*  25.3* 23.7*  MCV 85.6 80.8 79.1 81.4  PLT 554* 404* 276 616   Basic Metabolic Panel: Recent Labs  Lab 11/06/17 1052 11/06/17 1550 11/07/17 0036 11/07/17 0530 11/08/17 0606  NA 139 141 140 140 139  K 4.0 3.9 3.2* 3.8 3.7  CL 114* 116* 115* 116* 115*  CO2 15* 19* 16* 16* 16*  GLUCOSE 246* 149* 159* 100* 171*  BUN 29* 27* 22* 18 12  CREATININE 1.62* 1.45* 1.31* 1.25* 1.15*  CALCIUM 8.1* 8.3* 8.4* 8.2* 7.4*  MG  --   --   --   --  1.5*   GFR: Estimated Creatinine Clearance: 53.8 mL/min (A) (by C-G formula based on SCr of 1.15 mg/dL (H)). Liver Function Tests: Recent Labs  Lab 11/05/17 1343  AST 19  ALT 16  ALKPHOS 90  BILITOT 1.8*  PROT 7.7  ALBUMIN 3.7   Recent Labs  Lab 11/05/17 1343  LIPASE 24   No results for input(s): AMMONIA in the last 168 hours. Coagulation Profile: No results for input(s):  INR, PROTIME in the last 168 hours. Cardiac Enzymes: No results for input(s): CKTOTAL, CKMB, CKMBINDEX, TROPONINI in the last 168 hours. BNP (last 3 results) No results for input(s): PROBNP in the last 8760 hours. HbA1C: Recent Labs    11/07/17 0530  HGBA1C 10.6*   CBG: Recent Labs  Lab 11/08/17 0100 11/08/17 0444 11/08/17 0506 11/08/17 0530 11/08/17 0738  GLUCAP 95 34* 39* 140* 142*   Lipid Profile: No results for input(s): CHOL, HDL, LDLCALC, TRIG, CHOLHDL, LDLDIRECT in the last 72 hours. Thyroid Function Tests: No results for input(s): TSH, T4TOTAL, FREET4, T3FREE, THYROIDAB in the last 72 hours. Anemia Panel: Recent Labs    11/08/17 0918  RETICCTPCT 1.1   Sepsis Labs: No results for input(s): PROCALCITON, LATICACIDVEN in the last 168 hours.  Recent Results (from the past 240 hour(s))  MRSA PCR Screening     Status: None   Collection Time: 11/05/17  6:58 PM  Result Value Ref Range Status   MRSA by PCR NEGATIVE NEGATIVE Final    Comment:        The GeneXpert MRSA Assay (FDA approved for NASAL specimens only), is one component of  a comprehensive MRSA colonization surveillance program. It is not intended to diagnose MRSA infection nor to guide or monitor treatment for MRSA infections. Performed at [facility], [place] [address], [place], [place] [address]   Culture, blood (Routine X 2) w Reflex to ID Panel     Status: None (Preliminary result)   Collection Time: 11/06/17  8:41 AM  Result Value Ref Range Status   Specimen Description   Final    BLOOD LEFT HAND Performed at [facility] [address] [place], [place] [address]    Special Requests   Final    BOTTLES DRAWN AEROBIC ONLY Blood Culture results may not be optimal due to an inadequate volume of blood received in culture bottles Performed at [facility] [address] [place], [place] [address]    Culture   Final    NO GROWTH 1 DAY Performed at [facility], [place] [address] [place], [place] [address]    Report Status PENDING  Incomplete  Gastrointestinal Panel by PCR , Stool     Status: None   Collection Time: 11/06/17  9:31 AM  Result Value Ref Range Status   Campylobacter species NOT DETECTED NOT DETECTED Final   Plesimonas shigelloides NOT DETECTED NOT DETECTED Final   Salmonella species NOT DETECTED NOT DETECTED Final   Yersinia enterocolitica NOT DETECTED NOT DETECTED Final   Vibrio species NOT DETECTED NOT DETECTED Final   Vibrio cholerae NOT DETECTED NOT DETECTED Final   Enteroaggregative E coli (EAEC) NOT DETECTED NOT DETECTED Final   Enteropathogenic E coli (EPEC) NOT DETECTED NOT DETECTED Final   Enterotoxigenic E coli (ETEC) NOT DETECTED NOT DETECTED Final   Shiga like toxin producing E coli (STEC) NOT DETECTED NOT DETECTED Final   Shigella/Enteroinvasive E coli (EIEC) NOT DETECTED NOT DETECTED Final   Cryptosporidium NOT DETECTED NOT DETECTED Final   Cyclospora cayetanensis NOT DETECTED NOT DETECTED Final   Entamoeba histolytica NOT DETECTED NOT DETECTED Final    Giardia lamblia NOT DETECTED NOT DETECTED Final   Adenovirus F40/41 NOT DETECTED NOT DETECTED Final   Astrovirus NOT DETECTED NOT DETECTED Final   Norovirus GI/GII NOT DETECTED NOT DETECTED Final   Rotavirus A NOT DETECTED NOT DETECTED Final   Sapovirus (I, II, IV, and V) NOT DETECTED NOT DETECTED Final  Comment: Performed at [facility], [place] [place], [place] [address]  Culture, Urine     Status: None   Collection Time: 11/06/17  6:35 PM  Result Value Ref Range Status   Specimen Description   Final    URINE, RANDOM Performed at [facility] [address] [place], [place] [address]    Special Requests   Final    NONE Performed at [facility], [place] [address], [place], [place] [address]    Culture   Final    NO GROWTH Performed at [facility], [place] [address], [place],  [address]    Report Status 11/08/2017 FINAL  Final         Radiology Studies: No results found.      Scheduled Meds: . dextrose      . busPIRone  10 mg Oral TID  . clonazePAM  0.5 mg Oral BID  . enoxaparin (LOVENOX) injection  40 mg Subcutaneous Q24H  . famotidine  20 mg Oral QHS  . insulin aspart  0-5 Units Subcutaneous QHS  . insulin aspart  0-9 Units Subcutaneous TID WC  . insulin detemir  5 Units Subcutaneous BID  . mirtazapine  15 mg Oral QHS  . pregabalin  150 mg Oral Daily  . pyridostigmine  30 mg Oral TID  . simethicone  160 mg Oral TID  . sodium bicarbonate  650 mg Oral BID  . sodium chloride flush  10-40 mL Intracatheter Q12H  . tamsulosin  0.4 mg Oral QPC supper   Continuous Infusions: . sodium chloride       LOS: 3 days    Time spent: 35 minutes    Irine Seal, MD [facility] Pager [phone]  If 7PM-7AM, please contact night-coverage [email or web address] Password [facility] 11/08/2017, 9:36 AM

## 2017-11-08 NOTE — Care Management Important Message (Signed)
Important Message  Patient Details  Name: [name] MRN: [id number] Date of Birth: 04-02-1966   Medicare Important Message Given:  Yes    Kerin Salen 11/08/2017, 12:36 [place] Message  Patient Details  Name: [name] MRN: [id number] Date of Birth: 07/16/1965   Medicare Important Message Given:  Yes    Kerin Salen 11/08/2017, 12:33 [place] Message  Patient Details  Name: [name] MRN: [id number] Date of Birth: [birth year]   Medicare Important Message Given:  Yes    Kerin Salen 11/08/2017, 12:33 PM

## 2017-11-09 DIAGNOSIS — E538 Deficiency of other specified B group vitamins: Secondary | ICD-10-CM

## 2017-11-09 DIAGNOSIS — D509 Iron deficiency anemia, unspecified: Secondary | ICD-10-CM

## 2017-11-09 DIAGNOSIS — E8770 Fluid overload, unspecified: Secondary | ICD-10-CM

## 2017-11-09 LAB — BASIC METABOLIC PANEL
Anion gap: 8 (ref 5–15)
BUN: 8 mg/dL (ref 6–20)
CHLORIDE: 111 mmol/L (ref 101–111)
CO2: 22 mmol/L (ref 22–32)
Calcium: 8.1 mg/dL — ABNORMAL LOW (ref 8.9–10.3)
Creatinine, Ser: 1.17 mg/dL — ABNORMAL HIGH (ref 0.44–1.00)
GFR calc Af Amer: >60 mL/min (ref >60)
GFR, EST NON AFRICAN AMERICAN: 53 mL/min — AB (ref >60)
GLUCOSE: 143 mg/dL — AB (ref 65–99)
POTASSIUM: 3.2 mmol/L — AB (ref 3.5–5.1)
Sodium: 141 mmol/L (ref 135–145)

## 2017-11-09 LAB — CBC WITH DIFFERENTIAL/PLATELET
Basophils Absolute: 0 10*3/uL (ref 0.0–0.1)
Basophils Relative: 0 %
EOS PCT: 3 %
Eosinophils Absolute: 0.1 10*3/uL (ref 0.0–0.7)
HEMATOCRIT: 25.2 % — AB (ref 36.0–46.0)
HEMOGLOBIN: 8 g/dL — AB (ref 12.0–15.0)
LYMPHS ABS: 1.6 10*3/uL (ref 0.7–4.0)
Lymphocytes Relative: 37 %
MCH: 25.5 pg — AB (ref 26.0–34.0)
MCHC: 31.7 g/dL (ref 30.0–36.0)
MCV: 80.3 fL (ref 78.0–100.0)
Monocytes Absolute: 0.5 10*3/uL (ref 0.1–1.0)
Monocytes Relative: 12 %
NEUTROS PCT: 48 %
Neutro Abs: 2 10*3/uL (ref 1.7–7.7)
Platelets: 244 10*3/uL (ref 150–400)
RBC: 3.14 MIL/uL — AB (ref 3.87–5.11)
RDW: 15.1 % (ref 11.5–15.5)
WBC: 4.2 10*3/uL (ref 4.0–10.5)

## 2017-11-09 LAB — GLUCOSE, CAPILLARY
GLUCOSE-CAPILLARY: 139 mg/dL — AB (ref 65–99)
GLUCOSE-CAPILLARY: 237 mg/dL — AB (ref 65–99)
GLUCOSE-CAPILLARY: 250 mg/dL — AB (ref 65–99)
Glucose-Capillary: 281 mg/dL — ABNORMAL HIGH (ref 65–99)

## 2017-11-09 MED ORDER — POTASSIUM CHLORIDE CRYS ER 20 MEQ PO TBCR
40.0000 meq | EXTENDED_RELEASE_TABLET | ORAL | Status: AC
  Administered 2017-11-09 (×2): 40 meq via ORAL
  Filled 2017-11-09 (×2): qty 2

## 2017-11-09 MED ORDER — FUROSEMIDE 10 MG/ML IJ SOLN
20.0000 mg | Freq: Once | INTRAMUSCULAR | Status: AC
  Administered 2017-11-09: 20 mg via INTRAVENOUS
  Filled 2017-11-09: qty 2

## 2017-11-09 MED ORDER — INSULIN DETEMIR 100 UNIT/ML ~~LOC~~ SOLN
8.0000 [IU] | Freq: Two times a day (BID) | SUBCUTANEOUS | Status: DC
  Administered 2017-11-09: 8 [IU] via SUBCUTANEOUS
  Filled 2017-11-09 (×2): qty 0.08

## 2017-11-09 NOTE — Progress Notes (Signed)
PROGRESS NOTE    [name]  MVH:[id number] DOB: Nov 19, 1965 DOA: 11/05/2017 PCP: [name], MD    Brief Narrative:  Patient is a 52 year old female history of type 1 diabetes, hypertension, gastroparesis, gastroesophageal reflux disease, hyperlipidemia, depression presented with nausea vomiting mild abdominal discomfort and loose bowel movements x5 days.  On presentation to the ED patient noted to be hyperglycemic, tachycardic and in DKA.  Patient also noted to be hyperkalemic and in acute renal failure with a white count of 21,000 and a hemoglobin of 10.  Urinalysis unremarkable.  Chest x-ray active disease.  Recent admit to stepdown unit and placed on the glucose stabilizer.   Assessment & Plan:   Principal Problem:   DKA (diabetic ketoacidoses) ([place]) Active Problems:   Diabetic neuropathy, type I diabetes mellitus ([place])   Diabetic gastroparesis ([facility])   Vitamin B12 deficiency   Anemia, iron deficiency   HLD (hyperlipidemia)   GERD (gastroesophageal reflux disease)   Essential hypertension   Leukocytosis   DKA, type 1 ([facility])   Diarrhea   Tachycardia   Severe protein-calorie malnutrition (HCC)   Hyperkalemia   ARF (acute renal failure) ([facility])  #1 DKA/type 1 diabetes Questionable etiology.  Patient states was compliant with her medications up until 1 to 2 days prior to admission when she was not feeling well enough to take him.  On admission blood glucose noted to be as high as 775 with a bicarb of less than 7.  Anion gap of > 25.  she is also noted to have a leukocytosis of 21.2.  Urinalysis with greater than 500 glucose, 80 ketones, nitrite negative leukocytes negative.  Chest x-ray negative for any acute infiltrate.  Urinalysis unremarkable.  Patient afebrile.  Patient improved clinically.  Anion gap closed.  Patient was transitioned to long-acting subcutaneous insulin and sliding scale insulin. Hemoglobin A1c 10.6 on 11/07/2017.  CBGs noted to be in the 30s the morning of  11/08/2017 and as such Levemir dose decreased to 5 units twice daily.  Blood glucose levels improved and currently CBGs ranging from 139 -281.  Increase Levemir to 8 units twice daily.  Continue sliding scale insulin.  Diabetes education.  2.  Gastroparesis Patient started on erythromycin.  Patient with multiple loose stools while on erythromycin.  Erythromycin has been discontinued.  Patient unable to tolerate Reglan in the past.  Needs outpatient follow-up with PCP.  Follow.   3.  Hypertension Blood pressure was borderline and has subsequently improved.  Saline lock IV fluids.  Follow.   4.  Gastroesophageal reflux disease Pepcid.  Continue simethicone.   5.  Anxiety Stable.  Continue BuSpar and Klonopin.    6.  Diarrhea/loose stools GI pathogen panel negative.  Patient complained of multiple loose stools.  Patient was placed on erythromycin for gastroparesis which has since been discontinued as patient was having multiple loose stools.  Diarrhea improved.  Imodium as needed.   7.  Acute renal failure Patient with initially worsening renal function since admission.  On admission creatinine was 1.91 moved up to 2.06.  Likely secondary to a prerenal azotemia.  Renal function trending trended back down and improved.  Creatinine currently at 1.17. Renal ultrasound negative for hydronephrosis however with small echogenic kidneys bilaterally compatible with chronic medical renal disease.  Saline lock IV fluids.   8.  Hyperkalemia Likely secondary to DKA and acute renal failure.  Resolved with correction of diabetic ketoacidosis.  Now with hypokalemia secondary to dose of IV Lasix.  Replete potassium.  Follow.  9.  Tachycardia Secondary to problem #1.  Improved with hydration.  Saline lock IV fluids.   10.  Leukocytosis  Likely a reactive leukocytosis.  Urinalysis negative for UTI.  Chest x-ray negative for any acute infiltrate.  Patient afebrile.  GI panel negative.  Leukocytosis resolved.   No need for antibiotics.   11.  Iron deficiency anemia/vitamin B12 deficiency anemia Patient denies any overt bleeding.  Anemia panel with iron level of 16 and a ferritin of 7.  Vitamin B12 levels of 160.  Status post IV Feraheme x1 11/08/2017.  Continue vitamin B12 IM 1000 MCG's daily x6 more days, and then weekly, and then monthly.  Will need outpatient follow-up.   12 urinary retention Voiding trial.  DC Foley catheter.  13 mild volume overload Secondary to aggressive IV fluid resuscitation when patient had initially presented with DKA.  Patient given a dose of IV Lasix 20 mg x 1 on 11/08/2017 with a urine output of 5.3 L.  We will give another dose of Lasix 20 mg IV x1 and follow.   DVT prophylaxis: Lovenox Code Status: Full Family Communication: Updated patient.  No family at bedside. Disposition Plan: Home when clinically improved, blood glucose levels improved, diarrhea resolved and tolerating oral intake.   Consultants:   None  Procedures:   PICC line per IR, Dr. Anselm Pancoast 11/05/2017  Chest x-ray 11/05/2017  Renal ultrasound 11/06/2017  Antimicrobials:   Erythromycin 11/05/2017>>>>> 11/07/2017   Subjective: Patient sitting up on the side of the bed about to eat her lunch.  Patient complained of tightness in her lower extremities.  Patient denies any nausea or emesis.  Patient denies any further loose stools.  CBGs improved.  Patient asking for Foley catheter to be removed.  Objective: Vitals:   11/08/17 1406 11/08/17 1412 11/08/17 2100 11/09/17 0600  BP:   131/71 136/70  Pulse:   87 79  Resp: 18 (!) 24 17 16   Temp:   98.9 F (37.2 C) 98.2 F (36.8 C)  TempSrc:   Oral Oral  SpO2:   99% 93%  Weight:      Height:        Intake/Output Summary (Last 24 hours) at 11/09/2017 1221 Last data filed at 11/09/2017 1000 Gross per 24 hour  Intake 885.75 ml  Output 5300 ml  Net -4414.25 ml   Filed Weights   11/07/17 0706 11/08/17 0452  Weight: 68.3 kg (150 lb 9.6 oz) 70.3 kg  (154 lb 14.4 oz)    Examination:  General exam: NAD Respiratory system: Lungs clear to auscultation bilaterally.  Decreased breath sounds in the bases.  No wheezes, no crackles, no rhonchi.  Speaking in full sentences.  Cardiovascular system: Regular rate and rhythm no murmurs rubs or gallops.  No JVD.  Nonpitting edema.    Gastrointestinal system: Abdomen is soft, nontender, nondistended, positive bowel sounds.  No rebound.  No guarding.   Central nervous system: Alert and oriented. No focal neurological deficits. Extremities: Symmetric 5 x 5 power. Skin: No rashes, lesions or ulcers Psychiatry: Judgement and insight appear normal. Mood & affect appropriate.     Data Reviewed: I have personally reviewed following labs and imaging studies  CBC: Recent Labs  Lab 11/05/17 1343 11/06/17 0240 11/07/17 0530 11/08/17 0606 11/09/17 0348  WBC 21.2* 17.1* 11.0* 4.8 4.2  NEUTROABS 18.6*  --  9.1* 3.2 2.0  HGB 10.0* 8.8* 8.2* 7.5* 8.0*  HCT 33.2* 28.2* 25.3* 23.7* 25.2*  MCV 85.6 80.8 79.1 81.4 80.3  PLT  554* 404* 276 245 338   Basic Metabolic Panel: Recent Labs  Lab 11/06/17 1550 11/07/17 0036 11/07/17 0530 11/08/17 0606 11/09/17 0348  NA 141 140 140 139 141  K 3.9 3.2* 3.8 3.7 3.2*  CL 116* 115* 116* 115* 111  CO2 19* 16* 16* 16* 22  GLUCOSE 149* 159* 100* 171* 143*  BUN 27* 22* 18 12 8   CREATININE 1.45* 1.31* 1.25* 1.15* 1.17*  CALCIUM 8.3* 8.4* 8.2* 7.4* 8.1*  MG  --   --   --  1.5*  --    GFR: Estimated Creatinine Clearance: 52.9 mL/min (A) (by C-G formula based on SCr of 1.17 mg/dL (H)). Liver Function Tests: Recent Labs  Lab 11/05/17 1343  AST 19  ALT 16  ALKPHOS 90  BILITOT 1.8*  PROT 7.7  ALBUMIN 3.7   Recent Labs  Lab 11/05/17 1343  LIPASE 24   No results for input(s): AMMONIA in the last 168 hours. Coagulation Profile: No results for input(s): INR, PROTIME in the last 168 hours. Cardiac Enzymes: No results for input(s): CKTOTAL, CKMB,  CKMBINDEX, TROPONINI in the last 168 hours. BNP (last 3 results) No results for input(s): PROBNP in the last 8760 hours. HbA1C: Recent Labs    11/07/17 0530  HGBA1C 10.6*   CBG: Recent Labs  Lab 11/08/17 1143 11/08/17 1645 11/08/17 2100 11/09/17 0725 11/09/17 1133  GLUCAP 190* 146* 167* 139* 237*   Lipid Profile: No results for input(s): CHOL, HDL, LDLCALC, TRIG, CHOLHDL, LDLDIRECT in the last 72 hours. Thyroid Function Tests: No results for input(s): TSH, T4TOTAL, FREET4, T3FREE, THYROIDAB in the last 72 hours. Anemia Panel: Recent Labs    11/08/17 0918  VITAMINB12 160*  FOLATE 13.6  FERRITIN 7*  TIBC 336  IRON 16*  RETICCTPCT 1.1   Sepsis Labs: No results for input(s): PROCALCITON, LATICACIDVEN in the last 168 hours.  Recent Results (from the past 240 hour(s))  MRSA PCR Screening     Status: None   Collection Time: 11/05/17  6:58 PM  Result Value Ref Range Status   MRSA by PCR NEGATIVE NEGATIVE Final    Comment:        The GeneXpert MRSA Assay (FDA approved for NASAL specimens only), is one component of a comprehensive MRSA colonization surveillance program. It is not intended to diagnose MRSA infection nor to guide or monitor treatment for MRSA infections. Performed at [facility], [place] [address] [place], [place] [address]   Culture, blood (Routine X 2) w Reflex to ID Panel     Status: None (Preliminary result)   Collection Time: 11/06/17  8:41 AM  Result Value Ref Range Status   Specimen Description   Final    BLOOD LEFT HAND Performed at [facility] [address] [place], [place] [address]    Special Requests   Final    BOTTLES DRAWN AEROBIC ONLY Blood Culture results may not be optimal due to an inadequate volume of blood received in culture bottles Performed at [facility] [address] [place], [place] [address]    Culture   Final    NO GROWTH 3 DAYS Performed at [facility], [place] [address], [place], [place] [address]    Report Status PENDING  Incomplete  Gastrointestinal Panel by PCR , Stool     Status: None   Collection Time: 11/06/17  9:31 AM  Result Value Ref Range Status   Campylobacter species NOT DETECTED NOT DETECTED Final  Plesimonas shigelloides NOT DETECTED NOT DETECTED Final   Salmonella species NOT DETECTED NOT DETECTED Final   Yersinia enterocolitica NOT DETECTED NOT DETECTED Final   Vibrio species NOT DETECTED NOT DETECTED Final   Vibrio cholerae NOT DETECTED NOT DETECTED Final   Enteroaggregative E coli (EAEC) NOT DETECTED NOT DETECTED Final   Enteropathogenic E coli (EPEC) NOT DETECTED NOT DETECTED Final   Enterotoxigenic E coli (ETEC) NOT DETECTED NOT DETECTED Final   Shiga like toxin producing E coli (STEC) NOT DETECTED NOT DETECTED Final   Shigella/Enteroinvasive E coli (EIEC) NOT DETECTED NOT DETECTED Final   Cryptosporidium NOT DETECTED NOT DETECTED Final   Cyclospora cayetanensis NOT DETECTED NOT DETECTED Final   Entamoeba histolytica NOT DETECTED NOT DETECTED Final   Giardia lamblia NOT DETECTED NOT DETECTED Final   Adenovirus F40/41 NOT DETECTED NOT DETECTED Final   Astrovirus NOT DETECTED NOT DETECTED Final   Norovirus GI/GII NOT DETECTED NOT DETECTED Final   Rotavirus A NOT DETECTED NOT DETECTED Final   Sapovirus (I, II, IV, and V) NOT DETECTED NOT DETECTED Final    Comment: Performed at [facility], [place] [place], [place] [address]  Culture, Urine     Status: None   Collection Time: 11/06/17  6:35 PM  Result Value Ref Range Status   Specimen Description   Final    URINE, RANDOM Performed at [facility], [place] [address] [place], [place] [address]    Special Requests   Final    NONE Performed at [facility], [place] [address], [place], [place] [address]    Culture   Final    NO GROWTH Performed at [facility], [place] [address] [place], [place]  [address]    Report Status 11/08/2017 FINAL  Final         Radiology Studies: No results found.      Scheduled Meds: . busPIRone  10 mg Oral TID  . clonazePAM  0.5 mg Oral BID  . cyanocobalamin  1,000 mcg Intramuscular Daily  . enoxaparin (LOVENOX) injection  40 mg Subcutaneous Q24H  . famotidine  20 mg Oral QHS  . fluticasone  2 spray Each Nare Daily  . insulin aspart  0-5 Units Subcutaneous QHS  . insulin aspart  0-9 Units Subcutaneous TID WC  . insulin detemir  8 Units Subcutaneous BID  . loratadine  10 mg Oral Daily  . mirtazapine  15 mg Oral QHS  . pregabalin  150 mg Oral Daily  . pyridostigmine  30 mg Oral TID  . simethicone  160 mg Oral TID  . sodium chloride flush  10-40 mL Intracatheter Q12H  . tamsulosin  0.4 mg Oral QPC supper   Continuous Infusions:    LOS: 4 days    Time spent: 35 minutes    Irine Seal, MD [facility] Pager [phone]  If 7PM-7AM, please contact night-coverage [email or web address] Password [facility] 11/09/2017, 12:21 PM

## 2017-11-10 LAB — BASIC METABOLIC PANEL
ANION GAP: 8 (ref 5–15)
BUN: 14 mg/dL (ref 6–20)
CALCIUM: 8.9 mg/dL (ref 8.9–10.3)
CO2: 24 mmol/L (ref 22–32)
Chloride: 108 mmol/L (ref 101–111)
Creatinine, Ser: 1.05 mg/dL — ABNORMAL HIGH (ref 0.44–1.00)
GFR calc non Af Amer: >60 mL/min (ref >60)
Glucose, Bld: 198 mg/dL — ABNORMAL HIGH (ref 65–99)
Potassium: 3.8 mmol/L (ref 3.5–5.1)
SODIUM: 140 mmol/L (ref 135–145)

## 2017-11-10 LAB — CBC WITH DIFFERENTIAL/PLATELET
BASOS ABS: 0 10*3/uL (ref 0.0–0.1)
BASOS PCT: 0 %
EOS ABS: 0.2 10*3/uL (ref 0.0–0.7)
Eosinophils Relative: 7 %
HCT: 26.2 % — ABNORMAL LOW (ref 36.0–46.0)
HEMOGLOBIN: 8.4 g/dL — AB (ref 12.0–15.0)
Lymphocytes Relative: 39 %
Lymphs Abs: 1.2 10*3/uL (ref 0.7–4.0)
MCH: 25.6 pg — ABNORMAL LOW (ref 26.0–34.0)
MCHC: 32.1 g/dL (ref 30.0–36.0)
MCV: 79.9 fL (ref 78.0–100.0)
MONO ABS: 0.3 10*3/uL (ref 0.1–1.0)
MONOS PCT: 11 %
NEUTROS ABS: 1.4 10*3/uL — AB (ref 1.7–7.7)
Neutrophils Relative %: 43 %
Platelets: 223 10*3/uL (ref 150–400)
RBC: 3.28 MIL/uL — ABNORMAL LOW (ref 3.87–5.11)
RDW: 14.6 % (ref 11.5–15.5)
WBC: 3.2 10*3/uL — ABNORMAL LOW (ref 4.0–10.5)

## 2017-11-10 LAB — GLUCOSE, CAPILLARY
GLUCOSE-CAPILLARY: 275 mg/dL — AB (ref 65–99)
GLUCOSE-CAPILLARY: 278 mg/dL — AB (ref 65–99)
Glucose-Capillary: 190 mg/dL — ABNORMAL HIGH (ref 65–99)
Glucose-Capillary: 473 mg/dL — ABNORMAL HIGH (ref 65–99)
Glucose-Capillary: 389 mg/dL — ABNORMAL HIGH (ref 65–99)

## 2017-11-10 LAB — MAGNESIUM: MAGNESIUM: 1.6 mg/dL — AB (ref 1.7–2.4)

## 2017-11-10 MED ORDER — INSULIN DETEMIR 100 UNIT/ML ~~LOC~~ SOLN
10.0000 [IU] | Freq: Two times a day (BID) | SUBCUTANEOUS | Status: DC
  Administered 2017-11-10 (×2): 10 [IU] via SUBCUTANEOUS
  Filled 2017-11-10 (×3): qty 0.1

## 2017-11-10 MED ORDER — FUROSEMIDE 10 MG/ML IJ SOLN
20.0000 mg | Freq: Once | INTRAMUSCULAR | Status: AC
  Administered 2017-11-10: 20 mg via INTRAVENOUS
  Filled 2017-11-10: qty 2

## 2017-11-10 MED ORDER — MAGNESIUM SULFATE 4 GM/100ML IV SOLN
4.0000 g | Freq: Once | INTRAVENOUS | Status: AC
  Administered 2017-11-10: 4 g via INTRAVENOUS
  Filled 2017-11-10: qty 100

## 2017-11-10 MED ORDER — INSULIN ASPART 100 UNIT/ML ~~LOC~~ SOLN
10.0000 [IU] | Freq: Once | SUBCUTANEOUS | Status: AC
  Administered 2017-11-10: 10 [IU] via SUBCUTANEOUS

## 2017-11-10 MED ORDER — PYRIDOSTIGMINE BROMIDE 60 MG PO TABS
30.0000 mg | ORAL_TABLET | Freq: Three times a day (TID) | ORAL | Status: DC
  Administered 2017-11-10 – 2017-11-11 (×5): 30 mg via ORAL
  Filled 2017-11-10 (×5): qty 0.5

## 2017-11-10 MED ORDER — SIMETHICONE 80 MG PO CHEW: 160.0000 mg | CHEWABLE_TABLET | Freq: Four times a day (QID) | ORAL | Status: DC | PRN

## 2017-11-10 NOTE — Progress Notes (Signed)
CRITICAL VALUE ALERT  Critical Value: Blood Glucose 473  Date & Time Notied:  11/10/17;2144  Provider Notified:Yes  Orders Received/Actions taken: Awaiting new orders.

## 2017-11-10 NOTE — Progress Notes (Signed)
PROGRESS NOTE    [name]  ZOX:[id number] DOB: 1965/10/25 DOA: 11/05/2017 PCP: [name], MD    Brief Narrative:  Patient is a 52 year old female history of type 1 diabetes, hypertension, gastroparesis, gastroesophageal reflux disease, hyperlipidemia, depression presented with nausea vomiting mild abdominal discomfort and loose bowel movements x5 days.  On presentation to the ED patient noted to be hyperglycemic, tachycardic and in DKA.  Patient also noted to be hyperkalemic and in acute renal failure with a white count of 21,000 and a hemoglobin of 10.  Urinalysis unremarkable.  Chest x-ray active disease.  Recent admit to stepdown unit and placed on the glucose stabilizer.   Assessment & Plan:   Principal Problem:   DKA (diabetic ketoacidoses) ([place]) Active Problems:   Diabetic neuropathy, type I diabetes mellitus ([place])   Diabetic gastroparesis ([facility])   Vitamin B12 deficiency   Anemia, iron deficiency   HLD (hyperlipidemia)   GERD (gastroesophageal reflux disease)   Essential hypertension   Leukocytosis   DKA, type 1 ([facility])   Diarrhea   Tachycardia   Severe protein-calorie malnutrition ([facility])   Hyperkalemia   ARF (acute renal failure) ([facility])  #1 DKA/type 1 diabetes Questionable etiology.  Patient states was compliant with her medications up until 1 to 2 days prior to admission when she was not feeling well enough to take him.  On admission blood glucose noted to be as high as 775 with a bicarb of less than 7.  Anion gap of > 25.  she is also noted to have a leukocytosis of 21.2.  Urinalysis with greater than 500 glucose, 80 ketones, nitrite negative leukocytes negative.  Chest x-ray negative for any acute infiltrate.  Urinalysis unremarkable.  Patient afebrile.  Patient improved clinically.  Anion gap closed.  Patient was transitioned to long-acting subcutaneous insulin and sliding scale insulin. Hemoglobin A1c 10.6 on 11/07/2017.  CBGs noted to be in the 30s the morning of  11/08/2017 and as such Levemir dose decreased to 5 units twice daily.  Blood glucose levels have since improved likely due to improved oral intake.  CBGs ranging from 250-278.  Increase Levemir to 10 units twice daily.  Continue sliding scale insulin.  Diabetes education.   2.  Gastroparesis Patient started on erythromycin on admission which have subsequently been discontinued as patient was having multiple loose stools.  Patient unable to tolerate Reglan in the past.  Patient stated was started on Mestinon for her gastroparesis.  Review of patient's office visit from gastroenterologist office notes that patient was seen by a specialist at [facility] and started on Mestinon before meals for her gastroparesis and constipation.  Will resume patient back on Mestinon before her meals.  Follow.  3.  Hypertension Blood pressure was borderline however improved.  IV fluids saline lock.  Follow.    4.  Gastroesophageal reflux disease Pepcid.  Discontinue simethicone.    5.  Anxiety Stable.  Continue home regimen of Klonopin and BuSpar.   6.  Diarrhea/loose stools GI pathogen panel negative.  Patient complained of multiple loose stools.  Patient was placed on erythromycin for gastroparesis which has since been discontinued as patient was having multiple loose stools.  Diarrhea improved.  Imodium as needed.   7.  Acute renal failure Patient with initially worsening renal function since admission.  On admission creatinine was 1.91 moved up to 2.06.  Likely secondary to a prerenal azotemia.  Renal function trending trended back down and improved.  Creatinine currently at 1.05.  Renal ultrasound negative for hydronephrosis however with small echogenic kidneys bilaterally compatible with chronic medical renal disease.  Saline lock IV fluids.   8.  Hyperkalemia/hypokalemia/hypomagnesemia Likely secondary to DKA and acute renal failure.  Resolved with correction of diabetic  ketoacidosis.  Now with hypokalemia secondary to dose of IV Lasix.  Replete potassium.  Keep magnesium greater than 2.  Follow.    9.  Tachycardia Secondary to problem #1.  Improved.  IV fluids have been saline locked.   10.  Leukocytosis  Likely a reactive leukocytosis.  Urinalysis negative for UTI.  Chest x-ray negative for any acute infiltrate.  Patient afebrile.  GI panel negative.  Leukocytosis resolved.  No need for antibiotics.   11.  Iron deficiency anemia/vitamin B12 deficiency anemia Patient denies any overt bleeding.  Hemoglobin currently stable at 8.4.  Anemia panel with iron level of 16 and a ferritin of 7.  Vitamin B12 levels of 160.  Status post IV Feraheme x1 11/08/2017.  Continue vitamin B12 IM 1000 MCG's daily x 5 more days, and then weekly, and then monthly.  Will need outpatient follow-up.   12 urinary retention Patient with urine output of 3.6 L over the past 24 hours after Foley catheter has been removed.  Follow.    13 mild volume overload Secondary to aggressive IV fluid resuscitation when patient had initially presented with DKA.  Patient given a dose of IV Lasix 20 mg x 1 on 11/08/2017 with a urine output of 5.3 L.  Patient received a dose of Lasix 20 mg IV x1 11/09/2017 with a urine output of 3.6 L.  We will give another dose of Lasix 20 mg IV x1 and follow.    DVT prophylaxis: Lovenox Code Status: Full Family Communication: Updated patient.  No family at bedside. Disposition Plan: Home when clinically improved, blood glucose levels improved, diarrhea resolved and tolerating oral intake.   Consultants:   None  Procedures:   PICC line per IR, Dr. Anselm Pancoast 11/05/2017  Chest x-ray 11/05/2017  Renal ultrasound 11/06/2017  Antimicrobials:   Erythromycin 11/05/2017>>>>> 11/07/2017   Subjective: Patient sitting on the side of bed.  States feeling better.  No further loose stools.  Improvement with tightness in her lower extremities however not at baseline.  Oral  intake improving.  Good urine output after Foley catheter was removed.    Objective: Vitals:   11/09/17 0600 11/09/17 1329 11/09/17 2053 11/10/17 0549  BP: 136/70 133/66 118/60 (!) 146/71  Pulse: 79 82 81 82  Resp: 16 16 16 16   Temp: 98.2 F (36.8 C) 98.4 F (36.9 C) (!) 97.5 F (36.4 C) 98 F (36.7 C)  TempSrc: Oral Oral Oral Oral  SpO2: 93% 100% 95% 94%  Weight:      Height:        Intake/Output Summary (Last 24 hours) at 11/10/2017 1209 Last data filed at 11/10/2017 0948 Gross per 24 hour  Intake 320 ml  Output 5100 ml  Net -4780 ml   Filed Weights   11/07/17 0706 11/08/17 0452  Weight: 68.3 kg (150 lb 9.6 oz) 70.3 kg (154 lb 14.4 oz)    Examination:  General exam: NAD Respiratory system: CTAB.  Decreased breath sounds in the bases.  No wheezes, no crackles, no rhonchi.  Cardiovascular system: RRR no murmurs rubs or gallops.  No JVD.  Nonpitting edema.   Gastrointestinal system: Abdomen is nontender, nondistended, soft, positive bowel sounds.  No rebound.  No guarding.  Central nervous system: Alert and oriented.  No focal neurological deficits. Extremities: Symmetric 5 x 5 power. Skin: No rashes, lesions or ulcers Psychiatry: Judgement and insight appear normal. Mood & affect appropriate.     Data Reviewed: I have personally reviewed following labs and imaging studies  CBC: Recent Labs  Lab 11/05/17 1343 11/06/17 0240 11/07/17 0530 11/08/17 0606 11/09/17 0348 11/10/17 0339  WBC 21.2* 17.1* 11.0* 4.8 4.2 3.2*  NEUTROABS 18.6*  --  9.1* 3.2 2.0 1.4*  HGB 10.0* 8.8* 8.2* 7.5* 8.0* 8.4*  HCT 33.2* 28.2* 25.3* 23.7* 25.2* 26.2*  MCV 85.6 80.8 79.1 81.4 80.3 79.9  PLT 554* 404* 276 245 244 606   Basic Metabolic Panel: Recent Labs  Lab 11/07/17 0036 11/07/17 0530 11/08/17 0606 11/09/17 0348 11/10/17 0339  NA 140 140 139 141 140  K 3.2* 3.8 3.7 3.2* 3.8  CL 115* 116* 115* 111 108  CO2 16* 16* 16* 22 24  GLUCOSE 159* 100* 171* 143* 198*  BUN 22* 18  12 8 14   CREATININE 1.31* 1.25* 1.15* 1.17* 1.05*  CALCIUM 8.4* 8.2* 7.4* 8.1* 8.9  MG  --   --  1.5*  --  1.6*   GFR: Estimated Creatinine Clearance: 58.9 mL/min (A) (by C-G formula based on SCr of 1.05 mg/dL (H)). Liver Function Tests: Recent Labs  Lab 11/05/17 1343  AST 19  ALT 16  ALKPHOS 90  BILITOT 1.8*  PROT 7.7  ALBUMIN 3.7   Recent Labs  Lab 11/05/17 1343  LIPASE 24   No results for input(s): AMMONIA in the last 168 hours. Coagulation Profile: No results for input(s): INR, PROTIME in the last 168 hours. Cardiac Enzymes: No results for input(s): CKTOTAL, CKMB, CKMBINDEX, TROPONINI in the last 168 hours. BNP (last 3 results) No results for input(s): PROBNP in the last 8760 hours. HbA1C: No results for input(s): HGBA1C in the last 72 hours. CBG: Recent Labs  Lab 11/09/17 1133 11/09/17 1609 11/09/17 2055 11/10/17 0756 11/10/17 1133  GLUCAP 237* 281* 250* 275* 278*   Lipid Profile: No results for input(s): CHOL, HDL, LDLCALC, TRIG, CHOLHDL, LDLDIRECT in the last 72 hours. Thyroid Function Tests: No results for input(s): TSH, T4TOTAL, FREET4, T3FREE, THYROIDAB in the last 72 hours. Anemia Panel: Recent Labs    11/08/17 0918  VITAMINB12 160*  FOLATE 13.6  FERRITIN 7*  TIBC 336  IRON 16*  RETICCTPCT 1.1   Sepsis Labs: No results for input(s): PROCALCITON, LATICACIDVEN in the last 168 hours.  Recent Results (from the past 240 hour(s))  MRSA PCR Screening     Status: None   Collection Time: 11/05/17  6:58 PM  Result Value Ref Range Status   MRSA by PCR NEGATIVE NEGATIVE Final    Comment:        The GeneXpert MRSA Assay (FDA approved for NASAL specimens only), is one component of a comprehensive MRSA colonization surveillance program. It is not intended to diagnose MRSA infection nor to guide or monitor treatment for MRSA infections. Performed at [facility], [place] [address], [place], [place] [address]   Culture, blood  (Routine X 2) w Reflex to ID Panel     Status: None (Preliminary result)   Collection Time: 11/06/17  8:41 AM  Result Value Ref Range Status   Specimen Description   Final    BLOOD LEFT HAND Performed at [place] [address], [place],  [address]    Special Requests   Final    BOTTLES DRAWN AEROBIC ONLY Blood Culture results  may not be optimal due to an inadequate volume of blood received in culture bottles Performed at [place] [address], [place], [place] [address]    Culture   Final    NO GROWTH 4 DAYS Performed at [facility], [place] [address] [place], [place] [address]    Report Status PENDING  Incomplete  Gastrointestinal Panel by PCR , Stool     Status: None   Collection Time: 11/06/17  9:31 AM  Result Value Ref Range Status   Campylobacter species NOT DETECTED NOT DETECTED Final   Plesimonas shigelloides NOT DETECTED NOT DETECTED Final   Salmonella species NOT DETECTED NOT DETECTED Final   Yersinia enterocolitica NOT DETECTED NOT DETECTED Final   Vibrio species NOT DETECTED NOT DETECTED Final   Vibrio cholerae NOT DETECTED NOT DETECTED Final   Enteroaggregative E coli (EAEC) NOT DETECTED NOT DETECTED Final   Enteropathogenic E coli (EPEC) NOT DETECTED NOT DETECTED Final   Enterotoxigenic E coli (ETEC) NOT DETECTED NOT DETECTED Final   Shiga like toxin producing E coli (STEC) NOT DETECTED NOT DETECTED Final   Shigella/Enteroinvasive E coli (EIEC) NOT DETECTED NOT DETECTED Final   Cryptosporidium NOT DETECTED NOT DETECTED Final   Cyclospora cayetanensis NOT DETECTED NOT DETECTED Final   Entamoeba histolytica NOT DETECTED NOT DETECTED Final   Giardia lamblia NOT DETECTED NOT DETECTED Final   Adenovirus F40/41 NOT DETECTED NOT DETECTED Final   Astrovirus NOT DETECTED NOT DETECTED Final   Norovirus GI/GII NOT DETECTED NOT DETECTED Final   Rotavirus A NOT DETECTED NOT DETECTED Final   Sapovirus (I, II, IV, and  V) NOT DETECTED NOT DETECTED Final    Comment: Performed at [facility], [place] [place], [place] [address]  Culture, Urine     Status: None   Collection Time: 11/06/17  6:35 PM  Result Value Ref Range Status   Specimen Description   Final    URINE, RANDOM Performed at [facility], [place] [address] [place], [place] [address]    Special Requests   Final    NONE Performed at [facility], [place] [address], [place], [place] [address]    Culture   Final    NO GROWTH Performed at [facility], [place] [address], [place], [place] [address]    Report Status 11/08/2017 FINAL  Final         Radiology Studies: No results found.      Scheduled Meds: . busPIRone  10 mg Oral TID  . clonazePAM  0.5 mg Oral BID  . cyanocobalamin  1,000 mcg Intramuscular Daily  . enoxaparin (LOVENOX) injection  40 mg Subcutaneous Q24H  . famotidine  20 mg Oral QHS  . fluticasone  2 spray Each Nare Daily  . insulin aspart  0-5 Units Subcutaneous QHS  . insulin aspart  0-9 Units Subcutaneous TID WC  . insulin detemir  10 Units Subcutaneous BID  . loratadine  10 mg Oral Daily  . mirtazapine  15 mg Oral QHS  . pregabalin  150 mg Oral Daily  . pyridostigmine  30 mg Oral TID  . simethicone  160 mg Oral TID  . sodium chloride flush  10-40 mL Intracatheter Q12H  . tamsulosin  0.4 mg Oral QPC supper   Continuous Infusions:    LOS: 5 days    Time spent: 35 minutes    Irine Seal, MD [facility] Pager [phone]  If 7PM-7AM, please contact night-coverage  [email or web address] Password [facility] 11/10/2017, 12:09 PM

## 2017-11-11 DIAGNOSIS — E860 Dehydration: Secondary | ICD-10-CM

## 2017-11-11 LAB — CBC
HCT: 25.1 % — ABNORMAL LOW (ref 36.0–46.0)
HEMOGLOBIN: 8.3 g/dL — AB (ref 12.0–15.0)
MCH: 26.2 pg (ref 26.0–34.0)
MCHC: 33.1 g/dL (ref 30.0–36.0)
MCV: 79.2 fL (ref 78.0–100.0)
Platelets: 256 10*3/uL (ref 150–400)
RBC: 3.17 MIL/uL — ABNORMAL LOW (ref 3.87–5.11)
RDW: 14.7 % (ref 11.5–15.5)
WBC: 3.6 10*3/uL — ABNORMAL LOW (ref 4.0–10.5)

## 2017-11-11 LAB — GLUCOSE, CAPILLARY
GLUCOSE-CAPILLARY: 328 mg/dL — AB (ref 65–99)
Glucose-Capillary: 128 mg/dL — ABNORMAL HIGH (ref 65–99)
Glucose-Capillary: 289 mg/dL — ABNORMAL HIGH (ref 65–99)

## 2017-11-11 LAB — BASIC METABOLIC PANEL
ANION GAP: 8 (ref 5–15)
BUN: 19 mg/dL (ref 6–20)
CALCIUM: 8.4 mg/dL — AB (ref 8.9–10.3)
CO2: 27 mmol/L (ref 22–32)
CREATININE: 0.96 mg/dL (ref 0.44–1.00)
Chloride: 103 mmol/L (ref 101–111)
GFR calc Af Amer: >60 mL/min (ref >60)
GFR calc non Af Amer: >60 mL/min (ref >60)
GLUCOSE: 121 mg/dL — AB (ref 65–99)
Potassium: 3.2 mmol/L — ABNORMAL LOW (ref 3.5–5.1)
Sodium: 138 mmol/L (ref 135–145)

## 2017-11-11 LAB — CULTURE, BLOOD (ROUTINE X 2): Culture: NO GROWTH

## 2017-11-11 LAB — MAGNESIUM: Magnesium: 2 mg/dL (ref 1.7–2.4)

## 2017-11-11 MED ORDER — LORATADINE 10 MG PO TABS: 10.0000 mg | ORAL_TABLET | Freq: Every day | ORAL | 0 refills | Status: DC

## 2017-11-11 MED ORDER — CYANOCOBALAMIN 1000 MCG/ML IJ SOLN: 1000.0000 ug | Freq: Every day | INTRAMUSCULAR | 0 refills | Status: DC

## 2017-11-11 MED ORDER — INSULIN DETEMIR 100 UNIT/ML ~~LOC~~ SOLN
12.0000 [IU] | Freq: Two times a day (BID) | SUBCUTANEOUS | Status: DC
  Administered 2017-11-11: 12 [IU] via SUBCUTANEOUS
  Filled 2017-11-11 (×2): qty 0.12

## 2017-11-11 MED ORDER — TAMSULOSIN HCL 0.4 MG PO CAPS: 0.4000 mg | ORAL_CAPSULE | Freq: Every day | ORAL | 0 refills | Status: DC

## 2017-11-11 MED ORDER — "SYRINGE 22G X 1-1/2"" 3 ML MISC": 1000.0000 ug | Freq: Every day | 0 refills | Status: DC

## 2017-11-11 MED ORDER — PYRIDOSTIGMINE BROMIDE 60 MG PO TABS: 30.0000 mg | ORAL_TABLET | Freq: Three times a day (TID) | ORAL | 0 refills | Status: DC

## 2017-11-11 MED ORDER — FLUTICASONE PROPIONATE 50 MCG/ACT NA SUSP: 2.0000 | Freq: Every day | NASAL | 0 refills | Status: DC

## 2017-11-11 MED ORDER — POTASSIUM CHLORIDE CRYS ER 20 MEQ PO TBCR
40.0000 meq | EXTENDED_RELEASE_TABLET | ORAL | Status: AC
  Administered 2017-11-11 (×2): 40 meq via ORAL
  Filled 2017-11-11: qty 2

## 2017-11-11 NOTE — Discharge Summary (Signed)
Physician Discharge Summary  [name] HEN:[id number] DOB: 01/27/1966 DOA: 11/05/2017  PCP: [name], MD  Admit date: 11/05/2017 Discharge date: 11/11/2017  Time spent: 60 minutes  Recommendations for Outpatient Follow-up:  1. Follow-up at [facility] on 11/28/2017.  On follow-up patient will need a basic metabolic profile done to follow-up on electrolytes and renal function.  Patient will need a magnesium level checked.  Patient also need a CBC checked to follow-up on H&H.  Patient is vitamin B12 deficiency will also need to be followed up upon. 2. Follow-up with Dr. Henrene Pastor, gastroenterology in 2 weeks for follow-up on gastroparesis.   Discharge Diagnoses:  Principal Problem:   DKA (diabetic ketoacidoses) ([place]) Active Problems:   Diabetic neuropathy, type I diabetes mellitus (HCC)   Diabetic gastroparesis (HCC)   Vitamin B12 deficiency   AKI (acute kidney injury) (HCC)   Anemia, iron deficiency   HLD (hyperlipidemia)   GERD (gastroesophageal reflux disease)   Essential hypertension   Leukocytosis   DKA, type 1 (HCC)   Diarrhea   Tachycardia   Severe protein-calorie malnutrition (HCC)   Hyperkalemia   ARF (acute renal failure) ([place])   Dehydration   Discharge Condition: Stable and improved  Diet recommendation: Carb modified  Filed Weights   11/07/17 0706 11/08/17 0452 11/11/17 0820  Weight: 68.3 kg (150 lb 9.6 oz) 70.3 kg (154 lb 14.4 oz) 71.3 kg (157 lb 3 oz)    History of present illness:  Per Dr Lilian Coma is a 52 y.o. female with medical history significant of type 1 DM, hypertension, gastroparesis,  GERD, hyperlipidemia, depression, comes in for worsening nausea, vomiting and mild abdominal discomfort and  loose bowel movements since Thursday. On arrival to ED, she was found to be hyperglycemic, tachycardic and in DKA.  She denied any fever or chills. No chest pain, sob, syncope or palpitations.  She denied any hematochezia or  hematemesis.  No dysuria.  Labs significant for hyperkalemia, elevated creatinine of 1.91 [place] [address], hemoglobin of 10 and platelets of 554.  UA does not show any infection.  CXR was not done.  She was referred to medical service for admission for DKA.       Hospital Course:  1 DKA/type 1 diabetes Questionable etiology.  Patient stated was compliant with her medications up until 1 to 2 days prior to admission when she was not feeling well enough to take him.  On admission blood glucose noted to be as high as 775 with a bicarb of less than 7.  Anion gap of > 25.  she is also noted to have a leukocytosis of 21.2.  Urinalysis with greater than 500 glucose, 80 ketones, nitrite negative leukocytes negative.  Chest x-ray negative for any acute infiltrate.  Urinalysis unremarkable.  Patient afebrile.  Patient was admitted placed in the stepdown unit and placed on the glucose stabilizer.  Patient was aggressively hydrated with IV fluids.  Anion gap closed.  Patient improved clinically.  Acidosis also resolved.  Patient was transitioned to long-acting subcutaneous insulin and sliding scale insulin. Hemoglobin A1c 10.6 on 11/07/2017.  CBGs noted to be in the 30s the morning of 11/08/2017 and as such Levemir dose decreased to 5 units twice daily.  Blood glucose levels have since improved likely due to improved oral intake.  Since Levemir dose was subsequently adjusted back to home regimen of Levemir 12 units twice daily.  Patient maintained on sliding scale insulin.  Outpatient follow-up with PCP.  2.  Gastroparesis Patient started on erythromycin on admission which was subsequently been discontinued as patient was having multiple loose stools.  Patient unable to tolerate Reglan in the past.  Patient noted to have been started on Mestinon for her gastroparesis by her gastroenterologist prior to admission.  Review of patient's office visit from gastroenterologist office notes that patient was seen  by a specialist at [facility] and started on Mestinon before meals for her gastroparesis and constipation. Patient was placed back on home regimen of Mestinon before meals.  Patient be discharged in stable and improved condition is to follow-up with GI in the outpatient setting.   3.  Hypertension Blood pressure was borderline sugar during the hospitalization however improved with fluid resuscitation.  Blood pressure stable by day of discharge.   4.  Gastroesophageal reflux disease Patient maintained on Pepcid.  Patient will discharge back home on home regimen of Protonix.    5.  Anxiety Stable. Maintained on home regimen of BuSpar and Klonopin.   6.  Diarrhea/loose stools GI pathogen panel negative.  Patient complained of multiple loose stools.  Patient was placed on erythromycin for gastroparesis which was subsequently discontinued as patient was having multiple loose stools.  Diarrhea improved.  Outpatient follow-up.  7.  Acute renal failure Patient with initially worsening renal function since admission.  On admission creatinine was 1.91 moved up to 2.06.  Likely secondary to a prerenal azotemia.  Renal function trending trended back down and improved.  Renal ultrasound negative for hydronephrosis however with small echogenic kidneys bilaterally compatible with chronic medical renal disease.  No function had stabilized and creatinine was down to 0.96 by day of discharge.  Magnesium level was 2.0 by day of discharge.  Outpatient follow-up.   8.  Hyperkalemia/hypokalemia/hypomagnesemia Likely secondary to DKA and acute renal failure.  Resolved with correction of diabetic ketoacidosis.  Patient noted to have some bouts of hypokalemia and hypomagnesemia felt secondary to GI losses and diuresis.  Potassium and magnesium levels were repleted by day of discharge.  9.  Tachycardia Secondary to problem #1.  Improved with resolution of DKA and  hydration.  10.  Leukocytosis  Likely a reactive leukocytosis.  Urinalysis negative for UTI.  Chest x-ray negative for any acute infiltrate.  Patient remained afebrile.  GI pathogen panel which was done was negative.  Leukocytosis trended down and had resolved by day of discharge.    11.  Iron deficiency anemia/vitamin B12 deficiency anemia Patient denie any overt bleeding.  Hemoglobin trended down initially during the hospitalization secondary to aggressive fluid resuscitation.  Hemoglobin stabilized at 8.3 by day of discharge.  Anemia panel was obtained.  Anemia panel with iron level of 16 and a ferritin of 7.  Vitamin B12 levels of 160.    Given a dose of IV Feraheme x1 on 11/08/2017.  Patient subsequently placed on vitamin B12 IM injections.  Patient will be discharged on vitamin B12 IM injections daily x3-4 more days, and then weekly x1 month and then monthly.  Outpatient follow-up with PCP.    12 urinary retention Patient in the hospitalization was noted to have some urinary retention.  Foley catheter was placed and patient started on Flomax.  Patient underwent a voiding trial and was able to have good urine output.  Outpatient follow-up with PCP.     13 mild volume overload Secondary to aggressive IV fluid resuscitation when patient had initially presented with DKA.  Patient was given few doses of IV  Lasix with good urine output and significant clinical improvement.  Patient was euvolemic by day of discharge.  Outpatient follow-up.     Procedures:  PICC line per IR, Dr. Anselm Pancoast 11/05/2017  Chest x-ray 11/05/2017  Renal ultrasound 11/06/2017      Consultations:  None  Discharge Exam: Vitals:   11/11/17 0603 11/11/17 1320  BP: 115/65 (!) 152/86  Pulse: 80 91  Resp: 18 18  Temp: 98.4 F (36.9 C) 98.3 F (36.8 C)  SpO2: 94% 100%    General: NAD Cardiovascular: RRR Respiratory: CTAB  Discharge Instructions   Discharge Instructions    Diet Carb Modified   Complete  by:  As directed    Increase activity slowly   Complete by:  As directed      Allergies as of 11/11/2017      Reactions   Anesthetics, Amide Nausea And Vomiting   Penicillins Diarrhea, Nausea And Vomiting, Other (See Comments)   Has patient had a PCN reaction causing immediate rash, facial/tongue/throat swelling, SOB or lightheadedness with hypotension: Yes Has patient had a PCN reaction causing severe rash involving mucus membranes or skin necrosis: No Has patient had a PCN reaction that required hospitalization No Has patient had a PCN reaction occurring within the last 10 years: Yes  If all of the above answers are "NO", then may proceed with Cephalosporin use.   Buprenorphine Hcl Rash   Encainide Nausea And Vomiting   Metoclopramide Other (See Comments)   Dystonia, muscle rigidity.       Medication List    TAKE these medications   busPIRone 10 MG tablet Commonly known as:  BUSPAR Take 1 tablet (10 mg total) by mouth 3 (three) times daily.   clonazePAM 0.5 MG disintegrating tablet Commonly known as:  KLONOPIN Take 1 tablet (0.5 mg total) by mouth 2 (two) times daily.   cyanocobalamin 1000 MCG/ML injection Commonly known as:  (VITAMIN B-12) Inject 1 mL (1,000 mcg total) into the muscle daily. 1000 MCG's IM daily x3 days, then 1000 MCG's IM weekly x1 month, then 1000 MCG's IM monthly. Start taking on:  11/12/2017   fluticasone 50 MCG/ACT nasal spray Commonly known as:  FLONASE Place 2 sprays into both nostrils daily. Start taking on:  11/12/2017   furosemide 20 MG tablet Commonly known as:  LASIX Take 20 mg by mouth daily as needed for fluid or edema.   insulin aspart 100 UNIT/ML injection Commonly known as:  novoLOG Inject 0-12 Units into the skin 4 (four) times daily -  before meals and at bedtime. Uses a sliding scale: 70-119 = 0 units, 120-150 = 2 units, 151-180 =  4 units, 181-210 = 5 units, 211-240 = 6 units, 241-270 = 8 units, 271-300 = 9 units, 301-330 = 10  units, 331-360 = 11 units, 361-400 = 12 units, greater than 400 notify MD.   insulin detemir 100 UNIT/ML injection Commonly known as:  LEVEMIR Inject 12 Units into the skin 2 (two) times daily.   Insulin Pen Needle 31G X 5 MM Misc Commonly known as:  B-D UF III MINI PEN NEEDLES Use to inject insulin twice daily.   loratadine 10 MG tablet Commonly known as:  CLARITIN Take 1 tablet (10 mg total) by mouth daily. Start taking on:  11/12/2017   mirtazapine 15 MG tablet Commonly known as:  REMERON Take 1 tablet (15 mg total) by mouth at bedtime.   ondansetron 8 MG disintegrating tablet Commonly known as:  ZOFRAN-ODT Take 1 tablet (8 mg  total) by mouth every 8 (eight) hours as needed for nausea or vomiting.   pantoprazole 40 MG tablet Commonly known as:  PROTONIX Take 1 tablet (40 mg total) by mouth daily.   polyethylene glycol packet Commonly known as:  MIRALAX / GLYCOLAX Take 17 g by mouth daily as needed for mild constipation.   pregabalin 150 MG capsule Commonly known as:  LYRICA Take 1 capsule (150 mg total) by mouth daily.   pyridostigmine 60 MG tablet Commonly known as:  MESTINON Take 0.5 tablets (30 mg total) by mouth 3 (three) times daily. Take 1/2 with meals   SYRINGE 3CC/22GX1-1/2" 22G X 1-1/2" 3 ML Misc Inject 1,000 mcg into the muscle daily.   tamsulosin 0.4 MG Caps capsule Commonly known as:  FLOMAX Take 1 capsule (0.4 mg total) by mouth daily after supper.      Allergies  Allergen Reactions  . Anesthetics, Amide Nausea And Vomiting  . Penicillins Diarrhea, Nausea And Vomiting and Other (See Comments)    Has patient had a PCN reaction causing immediate rash, facial/tongue/throat swelling, SOB or lightheadedness with hypotension: Yes Has patient had a PCN reaction causing severe rash involving mucus membranes or skin necrosis: No Has patient had a PCN reaction that required hospitalization No Has patient had a PCN reaction occurring within the last 10  years: Yes  If all of the above answers are "NO", then may proceed with Cephalosporin use.   . Buprenorphine Hcl Rash  . Encainide Nausea And Vomiting  . Metoclopramide Other (See Comments)    Dystonia, muscle rigidity.    Follow-up Information    Irene Shipper, MD. Schedule an appointment as soon as possible for a visit in 2 week(s).   Specialty:  Gastroenterology Contact information: [address] [address] [phone]        [facility] Follow up on 11/28/2017.   Why:  @ 11:30a;bring photo ID, discharge papers Contact information: [address] [place] [address] [phone]           The results of significant diagnostics from this hospitalization (including imaging, microbiology, ancillary and laboratory) are listed below for reference.    Significant Diagnostic Studies: US Renal  Result Date: 11/06/2017 CLINICAL DATA:  Acute renal failure EXAM: RENAL / URINARY TRACT ULTRASOUND COMPLETE COMPARISON:  None. FINDINGS: Right Kidney: Length: 9.1 cm.  Increased echotexture.  No mass or hydronephrosis. Left Kidney: Length: 9.9 cm.  Increased echotexture.  No mass or hydronephrosis. Bladder: Appears normal for degree of bladder distention. IMPRESSION: Small, echogenic kidneys bilaterally compatible with chronic medical renal disease. No hydronephrosis. Electronically Signed   By: Rolm Baptise M.D.   On: 11/06/2017 10:09   Dg Chest Port 1 View  Result Date: 11/05/2017 CLINICAL DATA:  Nausea and vomiting EXAM: PORTABLE CHEST 1 VIEW COMPARISON:  06/15/2017 FINDINGS: Left upper extremity catheter tip overlies the distal SVC. No focal opacity or pleural effusion. Normal heart size. No pneumothorax. IMPRESSION: No active disease. Left upper extremity catheter tip overlies the SVC Electronically Signed   By: Donavan Foil M.D.   On: 11/05/2017 20:03   Irpicc Placement Left <5 Yrsinc Img Guide  Result Date:  11/05/2017 INDICATION: 52 year old with dehydration and hyperglycemia.  Poor venous access. EXAM: LEFT ARM PICC LINE PLACEMENT WITH ULTRASOUND AND FLUOROSCOPIC GUIDANCE MEDICATIONS: None ANESTHESIA/SEDATION: None FLUOROSCOPY TIME:  Fluoroscopy Time: 12 seconds, 0.6 mGy COMPLICATIONS: None immediate. PROCEDURE: The patient was advised of the possible risks and complications  and agreed to undergo the procedure. The patient was then brought to the angiographic suite for the procedure. The left arm was prepped with chlorhexidine, draped in the usual sterile fashion using maximum barrier technique (cap and mask, sterile gown, sterile gloves, large sterile sheet, hand hygiene and cutaneous antisepsis) and infiltrated locally with 1% Lidocaine. Ultrasound demonstrated patency of the left brachial vein, and this was documented with an image. Under real-time ultrasound guidance, this vein was accessed with a 21 gauge micropuncture needle and image documentation was performed. A 0.018 wire was introduced in to the vein. Over this, a 5 [place] dual lumen power injectable PICC was advanced to the lower SVC/right atrial junction. Fluoroscopy during the procedure and fluoro spot radiograph confirms appropriate catheter position. The catheter was flushed and covered with a sterile dressing. Catheter length: 37 cm IMPRESSION: Successful left arm Power PICC line placement with ultrasound and fluoroscopic guidance. The catheter is ready for use. Electronically Signed   By: Markus Daft M.D.   On: 11/05/2017 17:50   [place] Ekg Site Rite  Result Date: 11/05/2017 If Site Rite image not attached, placement could not be confirmed due to current cardiac rhythm.   Microbiology: Recent Results (from the past 240 hour(s))  MRSA PCR Screening     Status: None   Collection Time: 11/05/17  6:58 PM  Result Value Ref Range Status   MRSA by PCR NEGATIVE NEGATIVE Final    Comment:        The GeneXpert MRSA Assay (FDA approved for NASAL  specimens only), is one component of a comprehensive MRSA colonization surveillance program. It is not intended to diagnose MRSA infection nor to guide or monitor treatment for MRSA infections. Performed at [facility], [place] [address] [place], [place] [address]   Culture, blood (Routine X 2) w Reflex to ID Panel     Status: None   Collection Time: 11/06/17  8:41 AM  Result Value Ref Range Status   Specimen Description   Final    BLOOD LEFT HAND Performed at [facility] [address], [place], [place] [address]    Special Requests   Final    BOTTLES DRAWN AEROBIC ONLY Blood Culture results may not be optimal due to an inadequate volume of blood received in culture bottles Performed at [facility] [address], [place], [place] [address]    Culture   Final    NO GROWTH 5 DAYS Performed at [facility], [place] [address] [place], [place] [address]    Report Status 11/11/2017 FINAL  Final  Gastrointestinal Panel by PCR , Stool     Status: None   Collection Time: 11/06/17  9:31 AM  Result Value Ref Range Status   Campylobacter species NOT DETECTED NOT DETECTED Final   Plesimonas shigelloides NOT DETECTED NOT DETECTED Final   Salmonella species NOT DETECTED NOT DETECTED Final   Yersinia enterocolitica NOT DETECTED NOT DETECTED Final   Vibrio species NOT DETECTED NOT DETECTED Final   Vibrio cholerae NOT DETECTED NOT DETECTED Final   Enteroaggregative E coli (EAEC) NOT DETECTED NOT DETECTED Final   Enteropathogenic E coli (EPEC) NOT DETECTED NOT DETECTED Final   Enterotoxigenic E coli (ETEC) NOT DETECTED NOT DETECTED Final   Shiga like toxin producing E coli (STEC) NOT DETECTED NOT DETECTED Final   Shigella/Enteroinvasive E coli (EIEC) NOT DETECTED NOT DETECTED Final   Cryptosporidium NOT DETECTED NOT DETECTED Final   Cyclospora cayetanensis NOT DETECTED NOT  DETECTED Final   Entamoeba histolytica NOT DETECTED NOT  DETECTED Final   Giardia lamblia NOT DETECTED NOT DETECTED Final   Adenovirus F40/41 NOT DETECTED NOT DETECTED Final   Astrovirus NOT DETECTED NOT DETECTED Final   Norovirus GI/GII NOT DETECTED NOT DETECTED Final   Rotavirus A NOT DETECTED NOT DETECTED Final   Sapovirus (I, II, IV, and V) NOT DETECTED NOT DETECTED Final    Comment: Performed at [facility], [place] [place], [place] [address]  Culture, Urine     Status: None   Collection Time: 11/06/17  6:35 PM  Result Value Ref Range Status   Specimen Description   Final    URINE, RANDOM Performed at [facility], [place] [address], [place], [place] [address]    Special Requests   Final    NONE Performed at [facility], [place] [address] [place], [place] [address]    Culture   Final    NO GROWTH Performed at [facility], [place] [address] [place], [place] [address]    Report Status 11/08/2017 FINAL  Final     Labs: Basic Metabolic Panel: Recent Labs  Lab 11/07/17 0530 11/08/17 0606 11/09/17 0348 11/10/17 0339 11/11/17 0557  NA 140 139 141 140 138  K 3.8 3.7 3.2* 3.8 3.2*  CL 116* 115* 111 108 103  CO2 16* 16* 22 24 27   GLUCOSE 100* 171* 143* 198* 121*  BUN 18 12 8 14 19   CREATININE 1.25* 1.15* 1.17* 1.05* 0.96  CALCIUM 8.2* 7.4* 8.1* 8.9 8.4*  MG  --  1.5*  --  1.6* 2.0   Liver Function Tests: Recent Labs  Lab 11/05/17 1343  AST 19  ALT 16  ALKPHOS 90  BILITOT 1.8*  PROT 7.7  ALBUMIN 3.7   Recent Labs  Lab 11/05/17 1343  LIPASE 24   No results for input(s): AMMONIA in the last 168 hours. CBC: Recent Labs  Lab 11/05/17 1343  11/07/17 0530 11/08/17 0606 11/09/17 0348 11/10/17 0339 11/11/17 0557  WBC 21.2*   < > 11.0* 4.8 4.2 3.2* 3.6*  NEUTROABS 18.6*  --  9.1* 3.2 2.0 1.4*  --   HGB 10.0*   < > 8.2* 7.5* 8.0* 8.4* 8.3*  HCT 33.2*   < > 25.3* 23.7* 25.2* 26.2* 25.1*  MCV 85.6   < > 79.1 81.4 80.3 79.9 79.2  PLT 554*   < > 276 245 244  223 256   < > = values in this interval not displayed.   Cardiac Enzymes: No results for input(s): CKTOTAL, CKMB, CKMBINDEX, TROPONINI in the last 168 hours. BNP: BNP (last 3 results) No results for input(s): BNP in the last 8760 hours.  ProBNP (last 3 results) No results for input(s): PROBNP in the last 8760 hours.  CBG: Recent Labs  Lab 11/10/17 2128 11/10/17 2256 11/11/17 0736 11/11/17 1211 11/11/17 1624  GLUCAP 473* 389* 128* 289* 328*       Signed:  Irine Seal MD.  [facility] 11/11/2017, 4:26 PM

## 2017-11-11 NOTE — Care Management Note (Signed)
Case Management Note  Patient Details  Name: Makaylen Thieme MRN: [id number] Date of Birth: 14-Nov-1965  Subjective/Objective: [facility]-spoke to [place] for patient hospital f/u appt-see d/c section of f/u w/Dr. Lianne Bushy to patient that Dr. Margarita Rana is her married name-patient voiced understanding. Patient can continue use their blue card for meds @ [facility]-patient voiced understanding.                    Action/Plan:d/c home.   Expected Discharge Date:  11/11/17               Expected Discharge Plan:  Home/Self Care  In-House Referral:     Discharge planning Services  CM Consult, [facility]  Post Acute Care Choice:    Choice offered to:     DME Arranged:    DME Agency:     HH Arranged:    HH Agency:     Status of Service:  Completed, signed off  If discussed at [employer], dates discussed:    Additional Comments:  Dessa Phi, RN 11/11/2017, 3:47 PM

## 2017-11-11 NOTE — Progress Notes (Signed)
Inpatient Diabetes Program Recommendations  AACE/ADA: New Consensus Statement on Inpatient Glycemic Control (2015)  Target Ranges:  Prepandial:   less than 140 mg/dL      Peak postprandial:   less than 180 mg/dL (1-2 hours)      Critically ill patients:  140 - 180 mg/dL   Results for MARKAYLA, REICHART (MRN [id number]) as of 11/11/2017 08:47  Ref. Range 11/10/2017 07:56 11/10/2017 11:33 11/10/2017 16:31 11/10/2017 21:28 11/10/2017 22:56  Glucose-Capillary Latest Ref Range: 65 - 99 mg/dL 275 (H)  5 units NOVOLOG +  10 units LEVEMIR at 9am  278 (H)  5 units NOVOLOG  190 (H)  2 units NOVOLOG  473 (H)  10 units NOVOLOG +  10 units LEVEMIR at 9pm  389 (H)   Results for ADDALEE, KAVANAGH (MRN [id number]) as of 11/11/2017 08:47  Ref. Range 11/11/2017 07:36  Glucose-Capillary Latest Ref Range: 65 - 99 mg/dL 128 (H)    Home Meds: Levemir 12 units BID            Novolog 0-12 units QID per SSI   Current Orders: Levemir 12 units BID       Novolog Sensitive Correction Scale/ SSI (0-9 units) TID AC + HS       Note Levemir increased to 12 units BID this AM.    MD- If patient PO intake has improved, may consider starting low dose Novolog Meal Coverage to current inpatient insulin regimen:  Novolog 3 units TID with meals (hold if pt eats <50% of meal)  (Use Glycemic Control Order set)    --Will follow patient during hospitalization--  Wyn Quaker RN, MSN, CDE Diabetes Coordinator Inpatient Glycemic Control Team Team Pager: [phone] (8a-5p)

## 2017-11-13 ENCOUNTER — Telehealth: Payer: Self-pay

## 2017-11-13 LAB — GLUCOSE, CAPILLARY
GLUCOSE-CAPILLARY: 102 mg/dL — AB (ref 65–99)
Glucose-Capillary: 81 mg/dL (ref 65–99)

## 2017-11-13 NOTE — Telephone Encounter (Signed)
Call placed to the patient to check on her. She is known to the [facility] in the past. . She has an appointment at [facility] on 11/28/17 @ 1130 which is > 14 days from discharge but  she doesn't want to try to schedule an earlier appointment.  She said that since she now has medicare, she lost her medicaid and does not have medicaid transportation. She will need to schedule a cab ride as she has no one to bring her to the clinic.  Discussed the process of applying for [facility]. She was concerned that she would have to pay for a cab to take her to [facility] for an assessment but this CM explained to her that [facility] provides the transportation to their office for an assessment.  This CM also explained that if interested,she can complete a [facility] application when she comes to the clinic for her appointment.   She said that she has been feeling " good" since discharge but she has been feeling weak, lacking energy, feeling stiff  and is interested in PT. Informed her that Dr Margarita Rana would be notified of her request and she can discuss at her appointment on 11/28/17.   She said that she continues to live with her sister but she is now able to contribute to the expenses as she has started to receive her disability.  She noted that she has contacted social security about back pay and they have informed her that the case is still being reviewed.   She said that she has all of her current medications but will need refills when she comes to her appointment. She has not filled her new prescription for Vitamin B-12 injections, flonase, claritin, flomax.  She noted that she does not use flonase.   She also said that her blood sugars have been ' up and down" this morning her blood sugar was 192.   She is aware that she has an appointment with Dr Dwyane Dee on 12/24/17 and she has to schedule an appointment with Gastroenterology.   No other questions/concerns reported at this time.

## 2017-11-17 IMAGING — DX DG CHEST 1V PORT
1 series · 1 of 1 positions shown · non-contrast
Comparison: 03/22/2016

CLINICAL DATA: DKA.

EXAM:
PORTABLE CHEST 1 VIEW

[chest ap]
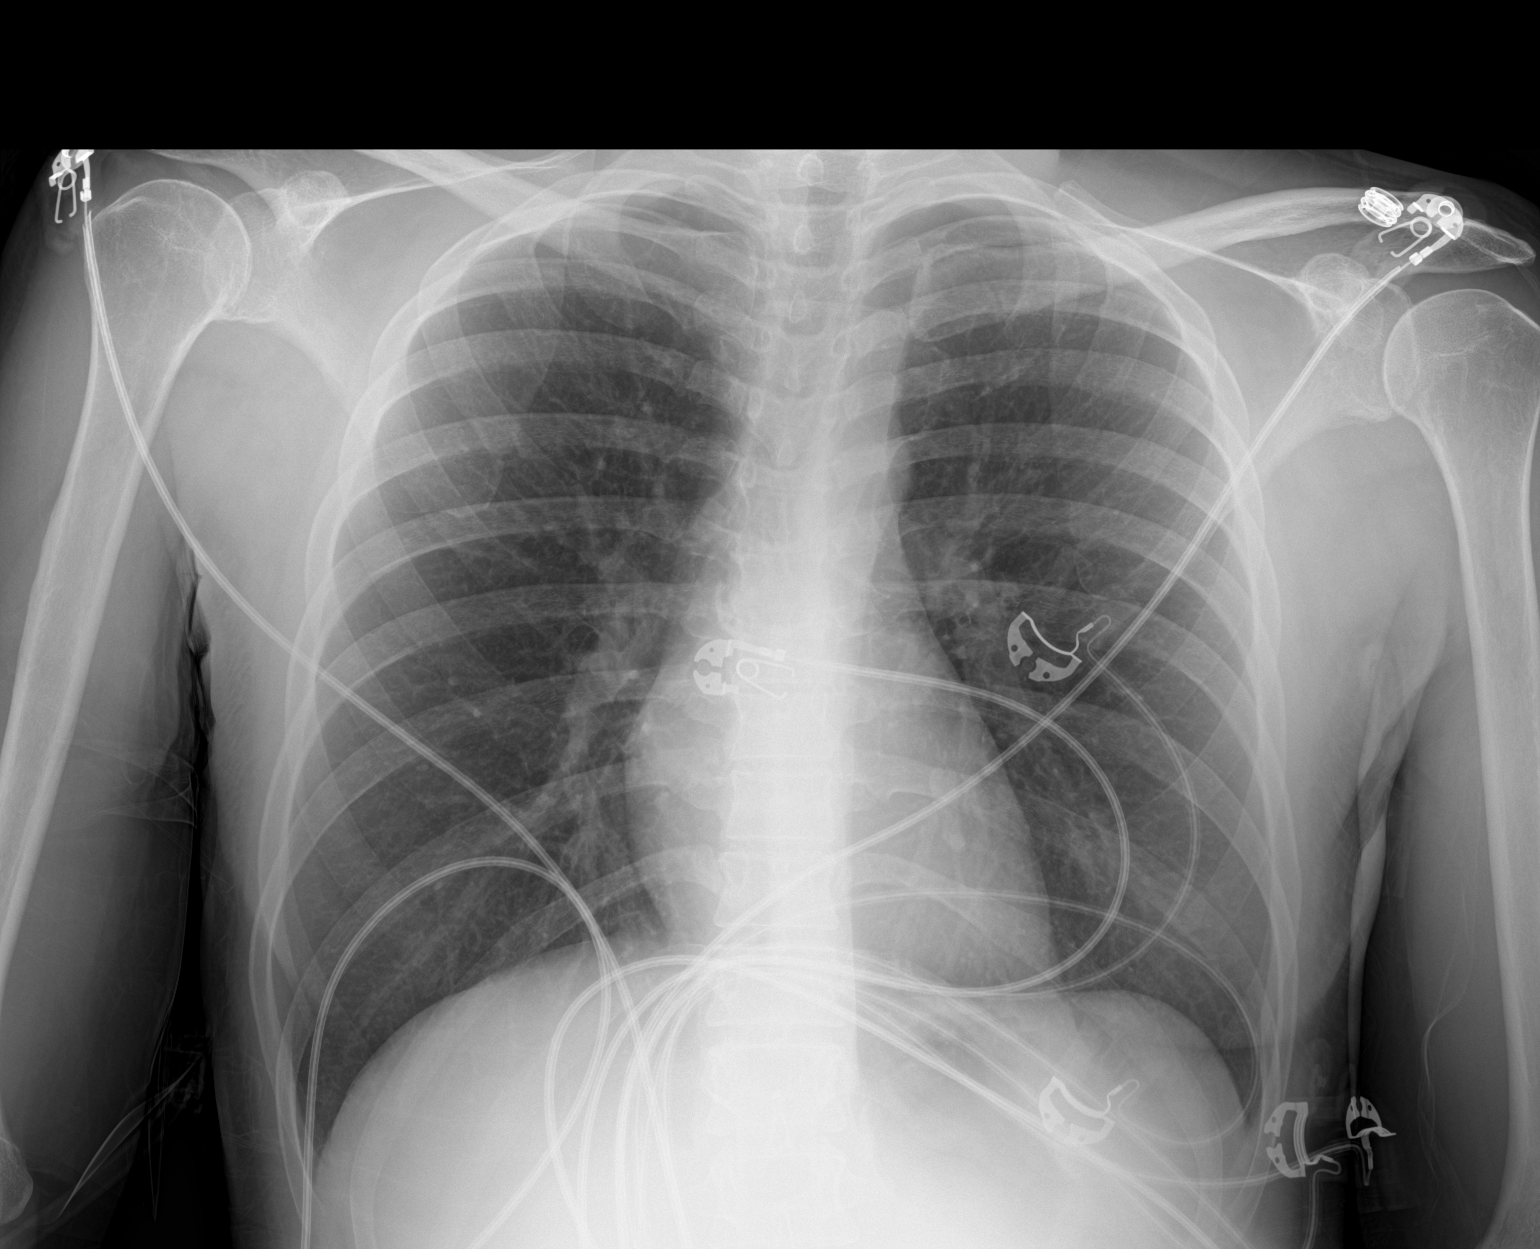

[1 of 1 positions shown; findings below may reference images not displayed]

FINDINGS: The cardiomediastinal silhouette is unremarkable.

There is no evidence of focal airspace disease, pulmonary edema,
suspicious pulmonary nodule/mass, pleural effusion, or pneumothorax.

No acute bony abnormalities are identified.
IMPRESSION: No active disease.

## 2017-11-22 ENCOUNTER — Encounter (HOSPITAL_COMMUNITY): Payer: Self-pay | Admitting: Nurse Practitioner

## 2017-11-22 ENCOUNTER — Inpatient Hospital Stay (HOSPITAL_COMMUNITY)
Admission: EM | Admit: 2017-11-22 | Discharge: 2017-11-25 | DRG: 637 | Disposition: A | Discharge status: Home / Self Care | Payer: Medicare Other | Attending: Internal Medicine | Admitting: Internal Medicine

## 2017-11-22 ENCOUNTER — Inpatient Hospital Stay (HOSPITAL_COMMUNITY): Payer: Medicare Other

## 2017-11-22 ENCOUNTER — Other Ambulatory Visit: Payer: Self-pay

## 2017-11-22 DIAGNOSIS — D509 Iron deficiency anemia, unspecified: Secondary | ICD-10-CM | POA: Diagnosis present

## 2017-11-22 DIAGNOSIS — I160 Hypertensive urgency: Secondary | ICD-10-CM | POA: Diagnosis present

## 2017-11-22 DIAGNOSIS — E1010 Type 1 diabetes mellitus with ketoacidosis without coma: Secondary | ICD-10-CM

## 2017-11-22 DIAGNOSIS — R112 Nausea with vomiting, unspecified: Secondary | ICD-10-CM

## 2017-11-22 DIAGNOSIS — R339 Retention of urine, unspecified: Secondary | ICD-10-CM | POA: Diagnosis present

## 2017-11-22 DIAGNOSIS — Z6826 Body mass index (BMI) 26.0-26.9, adult: Secondary | ICD-10-CM | POA: Diagnosis not present

## 2017-11-22 DIAGNOSIS — I959 Hypotension, unspecified: Secondary | ICD-10-CM | POA: Diagnosis not present

## 2017-11-22 DIAGNOSIS — N179 Acute kidney failure, unspecified: Secondary | ICD-10-CM | POA: Diagnosis present

## 2017-11-22 DIAGNOSIS — Z833 Family history of diabetes mellitus: Secondary | ICD-10-CM

## 2017-11-22 DIAGNOSIS — K219 Gastro-esophageal reflux disease without esophagitis: Secondary | ICD-10-CM | POA: Diagnosis present

## 2017-11-22 DIAGNOSIS — E1043 Type 1 diabetes mellitus with diabetic autonomic (poly)neuropathy: Secondary | ICD-10-CM | POA: Diagnosis present

## 2017-11-22 DIAGNOSIS — G43A0 Cyclical vomiting, not intractable: Secondary | ICD-10-CM | POA: Diagnosis not present

## 2017-11-22 DIAGNOSIS — K3184 Gastroparesis: Secondary | ICD-10-CM | POA: Diagnosis not present

## 2017-11-22 DIAGNOSIS — E1143 Type 2 diabetes mellitus with diabetic autonomic (poly)neuropathy: Secondary | ICD-10-CM | POA: Diagnosis present

## 2017-11-22 DIAGNOSIS — Z884 Allergy status to anesthetic agent status: Secondary | ICD-10-CM | POA: Diagnosis not present

## 2017-11-22 DIAGNOSIS — E1069 Type 1 diabetes mellitus with other specified complication: Secondary | ICD-10-CM | POA: Diagnosis present

## 2017-11-22 DIAGNOSIS — Z79899 Other long term (current) drug therapy: Secondary | ICD-10-CM | POA: Diagnosis not present

## 2017-11-22 DIAGNOSIS — K529 Noninfective gastroenteritis and colitis, unspecified: Secondary | ICD-10-CM | POA: Diagnosis present

## 2017-11-22 DIAGNOSIS — R000 Tachycardia, unspecified: Secondary | ICD-10-CM | POA: Diagnosis not present

## 2017-11-22 DIAGNOSIS — Z880 Allergy status to penicillin: Secondary | ICD-10-CM

## 2017-11-22 DIAGNOSIS — R109 Unspecified abdominal pain: Secondary | ICD-10-CM | POA: Diagnosis not present

## 2017-11-22 DIAGNOSIS — Z888 Allergy status to other drugs, medicaments and biological substances status: Secondary | ICD-10-CM | POA: Diagnosis not present

## 2017-11-22 DIAGNOSIS — E538 Deficiency of other specified B group vitamins: Secondary | ICD-10-CM | POA: Diagnosis present

## 2017-11-22 DIAGNOSIS — I10 Essential (primary) hypertension: Secondary | ICD-10-CM | POA: Diagnosis not present

## 2017-11-22 DIAGNOSIS — E860 Dehydration: Secondary | ICD-10-CM | POA: Diagnosis present

## 2017-11-22 DIAGNOSIS — E43 Unspecified severe protein-calorie malnutrition: Secondary | ICD-10-CM | POA: Diagnosis present

## 2017-11-22 DIAGNOSIS — E785 Hyperlipidemia, unspecified: Secondary | ICD-10-CM | POA: Diagnosis present

## 2017-11-22 DIAGNOSIS — E1110 Type 2 diabetes mellitus with ketoacidosis without coma: Secondary | ICD-10-CM | POA: Diagnosis present

## 2017-11-22 DIAGNOSIS — F329 Major depressive disorder, single episode, unspecified: Secondary | ICD-10-CM | POA: Diagnosis present

## 2017-11-22 DIAGNOSIS — E1165 Type 2 diabetes mellitus with hyperglycemia: Secondary | ICD-10-CM | POA: Diagnosis not present

## 2017-11-22 LAB — HEPATIC FUNCTION PANEL
ALBUMIN: 3.9 g/dL (ref 3.5–5.0)
ALT: 28 U/L (ref 14–54)
AST: 22 U/L (ref 15–41)
Alkaline Phosphatase: 93 U/L (ref 38–126)
Bilirubin, Direct: 0.1 mg/dL (ref 0.1–0.5)
Indirect Bilirubin: 1.4 mg/dL — ABNORMAL HIGH (ref 0.3–0.9)
TOTAL PROTEIN: 7.3 g/dL (ref 6.5–8.1)
Total Bilirubin: 1.5 mg/dL — ABNORMAL HIGH (ref 0.3–1.2)

## 2017-11-22 LAB — CBC
HEMATOCRIT: 36.7 % (ref 36.0–46.0)
HEMOGLOBIN: 11 g/dL — AB (ref 12.0–15.0)
MCH: 26.4 pg (ref 26.0–34.0)
MCHC: 30 g/dL (ref 30.0–36.0)
MCV: 88 fL (ref 78.0–100.0)
Platelets: 332 10*3/uL (ref 150–400)
RBC: 4.17 MIL/uL (ref 3.87–5.11)
RDW: 17.8 % — ABNORMAL HIGH (ref 11.5–15.5)
WBC: 15.5 10*3/uL — AB (ref 4.0–10.5)

## 2017-11-22 LAB — BASIC METABOLIC PANEL
ANION GAP: 22 — AB (ref 5–15)
ANION GAP: 17 — AB (ref 5–15)
BUN: 22 mg/dL — ABNORMAL HIGH (ref 6–20)
BUN: 22 mg/dL — AB (ref 6–20)
CALCIUM: 9.6 mg/dL (ref 8.9–10.3)
CHLORIDE: 107 mmol/L (ref 101–111)
CO2: 13 mmol/L — ABNORMAL LOW (ref 22–32)
CO2: 15 mmol/L — ABNORMAL LOW (ref 22–32)
Calcium: 9.1 mg/dL (ref 8.9–10.3)
Chloride: 101 mmol/L (ref 101–111)
Creatinine, Ser: 1.58 mg/dL — ABNORMAL HIGH (ref 0.44–1.00)
Creatinine, Ser: 1.44 mg/dL — ABNORMAL HIGH (ref 0.44–1.00)
GFR calc non Af Amer: 42 mL/min — ABNORMAL LOW (ref >60)
GFR, EST AFRICAN AMERICAN: 43 mL/min — AB (ref >60)
GFR, EST AFRICAN AMERICAN: 48 mL/min — AB (ref >60)
GFR, EST NON AFRICAN AMERICAN: 37 mL/min — AB (ref >60)
GLUCOSE: 497 mg/dL — AB (ref 65–99)
Glucose, Bld: 412 mg/dL — ABNORMAL HIGH (ref 65–99)
POTASSIUM: 4.8 mmol/L (ref 3.5–5.1)
Potassium: 5.2 mmol/L — ABNORMAL HIGH (ref 3.5–5.1)
SODIUM: 136 mmol/L (ref 135–145)
Sodium: 139 mmol/L (ref 135–145)

## 2017-11-22 LAB — BLOOD GAS, VENOUS
ACID-BASE DEFICIT: 15.4 mmol/L — AB (ref 0.0–2.0)
Bicarbonate: 12.1 mmol/L — ABNORMAL LOW (ref 20.0–28.0)
O2 SAT: 64.8 %
Patient temperature: 98.6
pCO2, Ven: 34.7 mmHg — ABNORMAL LOW (ref 44.0–60.0)
pH, Ven: 7.167 — CL (ref 7.250–7.430)
pO2, Ven: 43.5 mmHg (ref 32.0–45.0)

## 2017-11-22 LAB — I-STAT CHEM 8, ED
BUN: 19 mg/dL (ref 6–20)
CALCIUM ION: 1.16 mmol/L (ref 1.15–1.40)
CREATININE: 1.2 mg/dL — AB (ref 0.44–1.00)
Chloride: 104 mmol/L (ref 101–111)
Glucose, Bld: 482 mg/dL — ABNORMAL HIGH (ref 65–99)
HCT: 40 % (ref 36.0–46.0)
HEMOGLOBIN: 13.6 g/dL (ref 12.0–15.0)
Potassium: 5 mmol/L (ref 3.5–5.1)
SODIUM: 134 mmol/L — AB (ref 135–145)
TCO2: 14 mmol/L — AB (ref 22–32)

## 2017-11-22 LAB — CBC WITH DIFFERENTIAL/PLATELET
BASOS ABS: 0 10*3/uL (ref 0.0–0.1)
BASOS PCT: 0 %
EOS ABS: 0 10*3/uL (ref 0.0–0.7)
EOS PCT: 0 %
HCT: 38.2 % (ref 36.0–46.0)
Hemoglobin: 11.6 g/dL — ABNORMAL LOW (ref 12.0–15.0)
Lymphocytes Relative: 7 %
Lymphs Abs: 0.9 10*3/uL (ref 0.7–4.0)
MCH: 26.5 pg (ref 26.0–34.0)
MCHC: 30.4 g/dL (ref 30.0–36.0)
MCV: 87.4 fL (ref 78.0–100.0)
MONO ABS: 0.3 10*3/uL (ref 0.1–1.0)
Monocytes Relative: 2 %
Neutro Abs: 11.8 10*3/uL — ABNORMAL HIGH (ref 1.7–7.7)
Neutrophils Relative %: 91 %
PLATELETS: 401 10*3/uL — AB (ref 150–400)
RBC: 4.37 MIL/uL (ref 3.87–5.11)
RDW: 17.7 % — AB (ref 11.5–15.5)
WBC: 13 10*3/uL — AB (ref 4.0–10.5)

## 2017-11-22 LAB — I-STAT BETA HCG BLOOD, ED (MC, WL, AP ONLY)

## 2017-11-22 LAB — GLUCOSE, CAPILLARY: GLUCOSE-CAPILLARY: 281 mg/dL — AB (ref 65–99)

## 2017-11-22 LAB — CBG MONITORING, ED
Glucose-Capillary: 500 mg/dL — ABNORMAL HIGH (ref 65–99)
Glucose-Capillary: 459 mg/dL — ABNORMAL HIGH (ref 65–99)
Glucose-Capillary: 454 mg/dL — ABNORMAL HIGH (ref 65–99)
Glucose-Capillary: 380 mg/dL — ABNORMAL HIGH (ref 65–99)

## 2017-11-22 LAB — MAGNESIUM: Magnesium: 2 mg/dL (ref 1.7–2.4)

## 2017-11-22 LAB — TROPONIN I: Troponin I: <0.03 ng/mL (ref <0.03)

## 2017-11-22 IMAGING — DX DG ABDOMEN 1V
1 series · 1 of 1 positions shown · non-contrast
Comparison: 01/19/2016

CLINICAL DATA: Abdominal pain

EXAM:
ABDOMEN - 1 VIEW

[abdomen kub]
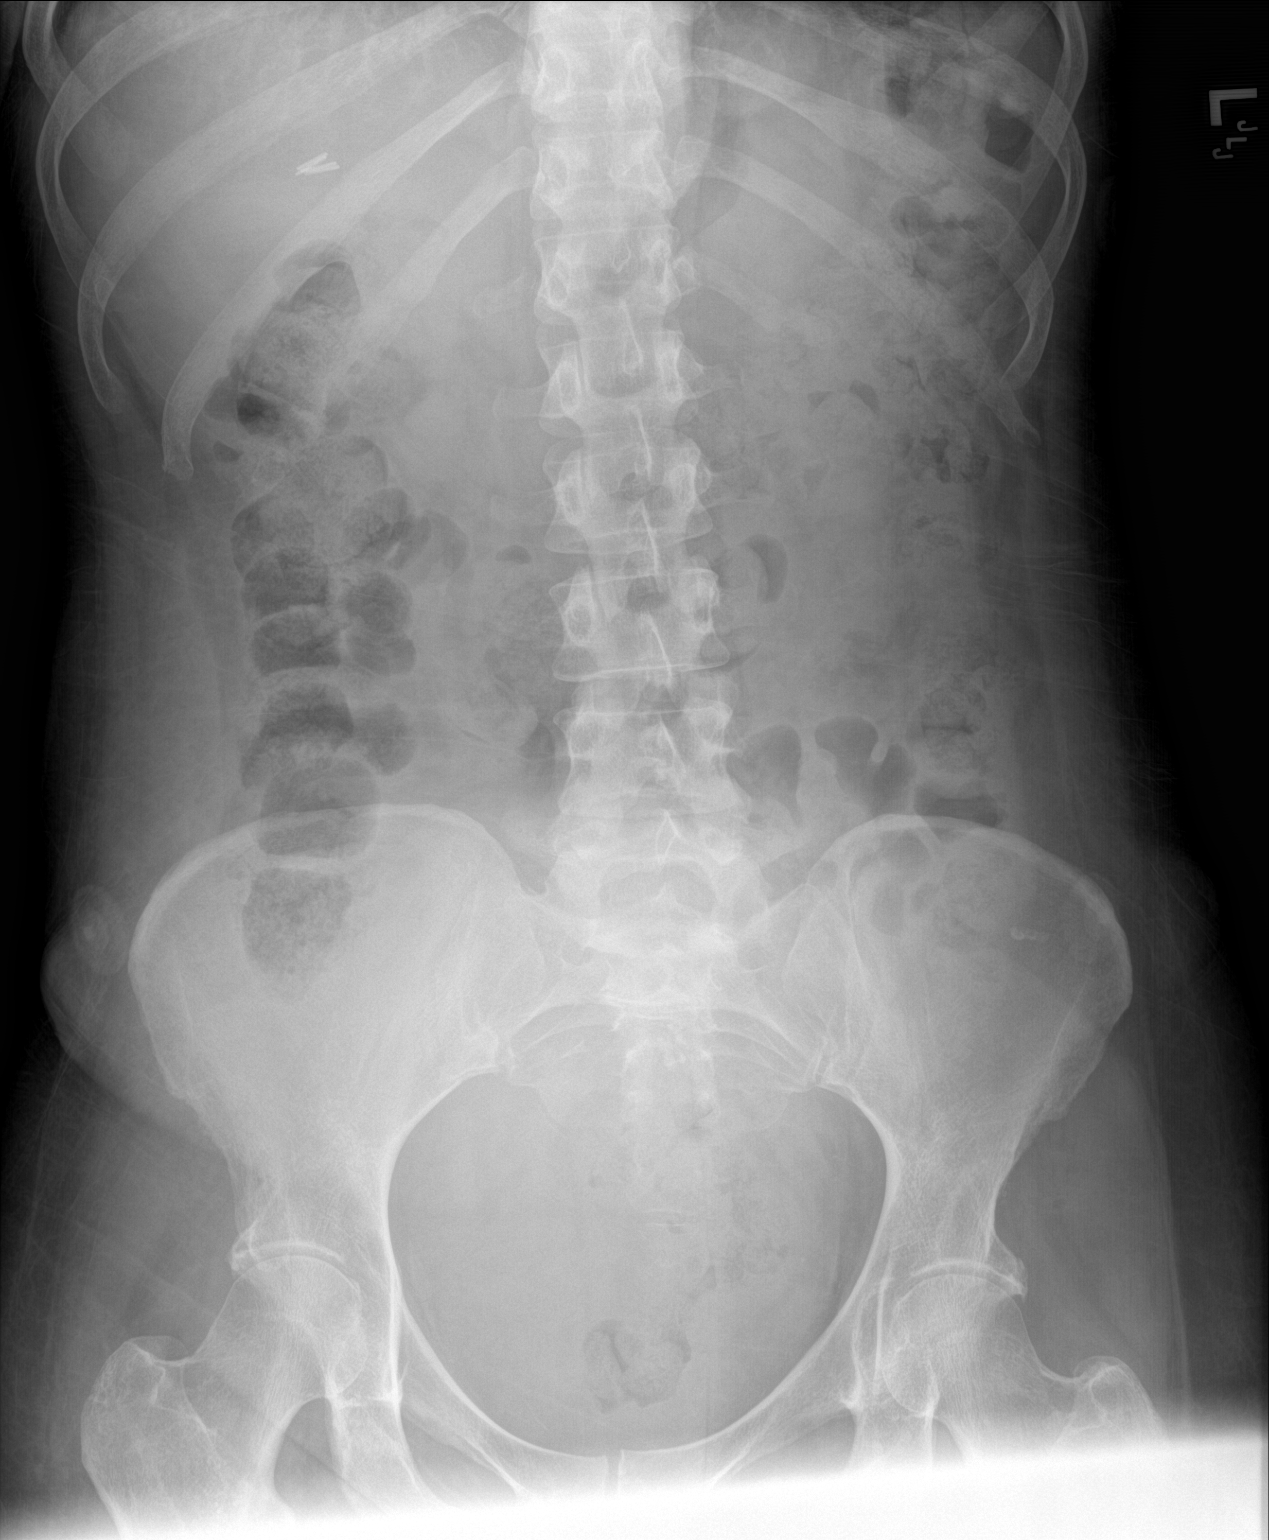

[1 of 1 positions shown; findings below may reference images not displayed]

FINDINGS: Moderate stool burden throughout the colon. Prior cholecystectomy.
There is a non obstructive bowel gas pattern. No supine evidence of
free air. No organomegaly or suspicious calcification. No acute bony
abnormality.
IMPRESSION: Moderate stool burden.  No acute findings.

## 2017-11-22 MED ORDER — MIRTAZAPINE 15 MG PO TABS
15.0000 mg | ORAL_TABLET | Freq: Every day | ORAL | Status: DC
  Administered 2017-11-22 – 2017-11-24 (×3): 15 mg via ORAL
  Filled 2017-11-22 (×2): qty 1
  Filled 2017-11-22: qty 0.5
  Filled 2017-11-22: qty 1

## 2017-11-22 MED ORDER — HYDRALAZINE HCL 20 MG/ML IJ SOLN: 10.0000 mg | INTRAMUSCULAR | Status: DC | PRN

## 2017-11-22 MED ORDER — SODIUM CHLORIDE 0.9 % IV SOLN
INTRAVENOUS | Status: DC
  Administered 2017-11-22: 4.4 [IU]/h via INTRAVENOUS

## 2017-11-22 MED ORDER — LACTATED RINGERS IV BOLUS
2000.0000 mL | Freq: Once | INTRAVENOUS | Status: AC
  Administered 2017-11-22: 2000 mL via INTRAVENOUS

## 2017-11-22 MED ORDER — HALOPERIDOL LACTATE 5 MG/ML IJ SOLN
5.0000 mg | Freq: Once | INTRAMUSCULAR | Status: AC
  Administered 2017-11-22: 5 mg via INTRAVENOUS
  Filled 2017-11-22: qty 1

## 2017-11-22 MED ORDER — ENOXAPARIN SODIUM 40 MG/0.4ML ~~LOC~~ SOLN
40.0000 mg | Freq: Every day | SUBCUTANEOUS | Status: DC
  Administered 2017-11-22 – 2017-11-24 (×3): 40 mg via SUBCUTANEOUS
  Filled 2017-11-22 (×3): qty 0.4

## 2017-11-22 MED ORDER — CLONAZEPAM 0.5 MG PO TBDP
0.5000 mg | ORAL_TABLET | Freq: Two times a day (BID) | ORAL | Status: DC
  Administered 2017-11-22 – 2017-11-25 (×5): 0.5 mg via ORAL
  Filled 2017-11-22 (×2): qty 1
  Filled 2017-11-22 (×2): qty 4
  Filled 2017-11-22 (×2): qty 1

## 2017-11-22 MED ORDER — SODIUM CHLORIDE 0.9 % IV SOLN
INTRAVENOUS | Status: DC
  Filled 2017-11-22: qty 1

## 2017-11-22 MED ORDER — SODIUM CHLORIDE 0.9 % IV SOLN
INTRAVENOUS | Status: DC
  Administered 2017-11-22: 10 mL/h via INTRAVENOUS
  Administered 2017-11-24: 13:00:00 via INTRAVENOUS

## 2017-11-22 MED ORDER — DEXTROSE-NACL 5-0.45 % IV SOLN: INTRAVENOUS | Status: DC

## 2017-11-22 MED ORDER — PYRIDOSTIGMINE BROMIDE 60 MG PO TABS
30.0000 mg | ORAL_TABLET | Freq: Three times a day (TID) | ORAL | Status: DC
  Administered 2017-11-22 – 2017-11-25 (×10): 30 mg via ORAL
  Filled 2017-11-22 (×10): qty 0.5

## 2017-11-22 MED ORDER — TAMSULOSIN HCL 0.4 MG PO CAPS
0.4000 mg | ORAL_CAPSULE | Freq: Every day | ORAL | Status: DC
  Administered 2017-11-22 – 2017-11-25 (×4): 0.4 mg via ORAL
  Filled 2017-11-22 (×4): qty 1

## 2017-11-22 MED ORDER — LACTATED RINGERS IV SOLN
INTRAVENOUS | Status: DC
  Administered 2017-11-22: 18:00:00 via INTRAVENOUS

## 2017-11-22 NOTE — ED Notes (Signed)
ED TO INPATIENT HANDOFF REPORT  Name/Age/Gender [name] 52 y.o. female  Code Status    Code Status Orders  (From admission, onward)        Start     Ordered   11/22/17 1933  Full code  Continuous     11/22/17 1934    Code Status History    Date Active Date Inactive Code Status Order ID Comments User Context   11/05/2017 1835 11/11/2017 2134 Full Code [id number]  Hosie Poisson, MD Inpatient   07/10/2017 0751 07/12/2017 2045 Full Code [id number]  Roxan Hockey, MD Inpatient   06/24/2017 0139 06/25/2017 1749 Full Code [id number]  Etta Quill, DO ED   06/16/2017 0324 06/17/2017 1738 Full Code [id number]  Elwyn Reach, MD Inpatient   06/11/2017 0324 06/12/2017 2030 Full Code [id number]  Bonnielee Haff, MD ED   05/16/2017 1756 05/18/2017 1832 Full Code [id number]  Geradine Girt, DO Inpatient   05/06/2017 1130 05/09/2017 2049 Full Code [id number]  Donne Hazel, MD ED   03/25/2017 0742 04/04/2017 0144 Full Code [id number]  Scatliffe, Rise Paganini, MD Inpatient   03/25/2017 0653 03/25/2017 0742 Full Code [id number]  Kandice Hams, MD Inpatient   03/16/2017 0606 03/20/2017 2040 Full Code [id number]  Jani Gravel, MD Inpatient   03/05/2017 1836 03/09/2017 1626 Full Code [id number]  Bonnielee Haff, MD Inpatient   02/05/2017 1118 02/06/2017 1706 Full Code [id number]  Merton Border, MD Inpatient   01/31/2017 2122 02/03/2017 1847 Full Code [id number]  Vashti Hey, MD Inpatient   09/27/2016 1833 09/29/2016 2030 Full Code [id number]  Doreatha Lew, MD Inpatient   04/20/2016 1809 04/23/2016 1937 Full Code [id number]  Hosie Poisson, MD Inpatient   04/06/2016 1350 04/09/2016 1829 Full Code [id number]  Elgergawy, Silver Huguenin, MD ED   03/23/2016 0122 03/26/2016 2101 Full Code [id number]  Vianne Bulls, MD ED   03/18/2016 1559 03/20/2016 1741 Full Code [id number]  Mariel Aloe, MD Inpatient   02/19/2016 1416 02/21/2016 1732 Full Code [id number]  Caren Griffins, MD ED   01/24/2016 0701 01/26/2016  2001 Full Code [id number]  Karmen Bongo, MD Inpatient   01/19/2016 1423 01/21/2016 1738 Full Code [id number]  Thurnell Lose, MD ED   11/27/2015 2201 12/01/2015 1839 Full Code [id number]  Toy Baker, MD Inpatient   11/14/2015 0405 11/16/2015 1931 Full Code [id number]  Ivor Costa, MD ED   09/02/2015 1233 09/05/2015 2212 Full Code [id number]  Kelvin Cellar, MD ED   07/29/2015 0755 08/01/2015 2303 Full Code [id number]  Reyne Dumas, MD ED   06/29/2015 2027 07/03/2015 1758 Full Code [id number]  Robbie Lis, MD Inpatient   06/22/2015 1058 06/25/2015 1847 Full Code [id number]  Radene Gunning, NP Inpatient   06/14/2015 1829 06/20/2015 2140 Full Code [id number]  Domenic Polite, MD Inpatient   05/26/2015 2202 06/05/2015 1929 Full Code [id number]  Reubin Milan, MD Inpatient   05/23/2015 0717 05/24/2015 1819 Full Code [id number]  Norval Morton, MD Inpatient   05/17/2015 1221 05/20/2015 1813 Full Code [id number]  Eugenie Filler, MD Inpatient   05/17/2015 1109 05/17/2015 1221 Full Code [id number]  Eugenie Filler, MD ED   04/19/2015 0238 04/20/2015 2229 Full Code [id number]  Rise Patience, MD Inpatient   04/05/2015 0543 04/09/2015 1925 Full Code [id number]  Rise Patience, MD Inpatient   04/05/2015 0537 04/05/2015 0543 Full Code [id number]  Despina Hick, RN Inpatient   03/09/2015 0742 03/12/2015 1911 Full Code [id number]  Theodis Blaze, MD  Inpatient   02/20/2015 0108 02/22/2015 1908 Full Code [id number]  Toy Baker, MD Inpatient   02/08/2015 2343 02/13/2015 1532 Full Code [id number]  Lavina Hamman, MD ED   10/21/2014 0057 10/23/2014 1514 Full Code [id number]  Rise Patience, MD Inpatient   02/12/2013 2316 02/17/2013 2122 Full Code [id number]  Quintella Baton, MD Inpatient   11/25/2012 1348 11/28/2012 1904 Full Code [id number]  Elmarie Shiley, MD ED   11/05/2012 1737 11/07/2012 1620 Full Code [id number]  Sheila Oats, MD Inpatient   04/03/2012 0227 04/05/2012 1643 Full Code [id number]   Quintella Baton, MD Inpatient   03/24/2012 0021 03/28/2012 1751 Full Code [id number]  Leeroy Cha, RN ED      Home/SNF/Other Home  Chief Complaint N/V / Hyperglycemia   Level of Care/Admitting Diagnosis ED Disposition    ED Disposition Condition Comment   [facility] [facility]: [facility] [[id number]]  Level of Care: Stepdown [14]  Admit to SDU based on following criteria: Severe physiological/psychological symptoms:  Any diagnosis requiring assessment & intervention at least every 4 hours on an ongoing basis to obtain desired patient outcomes including stability and rehabilitation  Diagnosis: DKA (diabetic ketoacidoses) [facility]) [[id number]]  Admitting Physician: Rise Patience [phone]  Attending Physician: Rise Patience [phone]  Estimated length of stay: past midnight tomorrow  Certification:: I certify this patient will need inpatient services for at least 2 midnights  PT Class (Do Not Modify): Inpatient [101]  PT Acc Code (Do Not Modify): Private [1]       Medical History Past Medical History:  Diagnosis Date  . Depression   . Diabetes mellitus without complication ([place])   . Essential hypertension   . Gastroparesis   . GERD (gastroesophageal reflux disease)   . HLD (hyperlipidemia)     Allergies Allergies  Allergen Reactions  . Anesthetics, Amide Nausea And Vomiting  . Penicillins Diarrhea, Nausea And Vomiting and Other (See Comments)    Has patient had a PCN reaction causing immediate rash, facial/tongue/throat swelling, SOB or lightheadedness with hypotension: Yes Has patient had a PCN reaction causing severe rash involving mucus membranes or skin necrosis: No Has patient had a PCN reaction that required hospitalization No Has patient had a PCN reaction occurring within the last 10 years: Yes  If all of the above answers are "NO", then may proceed with Cephalosporin use.   . Buprenorphine Hcl Rash  . Encainide Nausea And Vomiting   . Metoclopramide Other (See Comments)    Dystonia, muscle rigidity.     IV Location/Drains/Wounds Patient Lines/Drains/Airways Status   Active Line/Drains/Airways    Name:   Placement date:   Placement time:   Site:   Days:   Peripheral IV 11/22/17 Left;Medial Antecubital   11/22/17    1800    Antecubital   less than 1          Labs/Imaging Results for orders placed or performed during the hospital encounter of 11/22/17 (from the past 48 hour(s))  CBC with Differential     Status: Abnormal   Collection Time: 11/22/17  5:51 PM  Result Value Ref Range   WBC 13.0 (H) 4.0 - 10.5 K/uL   RBC 4.37 3.87 - 5.11 MIL/uL   Hemoglobin 11.6 (L) 12.0 - 15.0 g/dL   HCT 38.2 36.0 - 46.0 %   MCV 87.4 78.0 - 100.0 fL   MCH 26.5 26.0 - 34.0 pg   MCHC 30.4 30.0 - 36.0 g/dL   RDW 17.7 (  H) 11.5 - 15.5 %   Platelets 401 (H) 150 - 400 K/uL   Neutrophils Relative % 91 %   Neutro Abs 11.8 (H) 1.7 - 7.7 K/uL   Lymphocytes Relative 7 %   Lymphs Abs 0.9 0.7 - 4.0 K/uL   Monocytes Relative 2 %   Monocytes Absolute 0.3 0.1 - 1.0 K/uL   Eosinophils Relative 0 %   Eosinophils Absolute 0.0 0.0 - 0.7 K/uL   Basophils Relative 0 %   Basophils Absolute 0.0 0.0 - 0.1 K/uL    Comment: Performed at [facility], [place] [address], [place], [place] [address]  Basic metabolic panel     Status: Abnormal   Collection Time: 11/22/17  5:51 PM  Result Value Ref Range   Sodium 136 135 - 145 mmol/L   Potassium 5.2 (H) 3.5 - 5.1 mmol/L   Chloride 101 101 - 111 mmol/L   CO2 13 (L) 22 - 32 mmol/L   Glucose, Bld 497 (H) 65 - 99 mg/dL   BUN 22 (H) 6 - 20 mg/dL   Creatinine, Ser 1.58 (H) 0.44 - 1.00 mg/dL   Calcium 9.6 8.9 - 10.3 mg/dL   GFR calc non Af Amer 37 (L) >60 mL/min   GFR calc Af Amer 43 (L) >60 mL/min    Comment: (NOTE) The eGFR has been calculated using the CKD EPI equation. This calculation has not been validated in all clinical situations. eGFR's persistently <60 mL/min signify  possible Chronic Kidney Disease.    Anion gap 22 (H) 5 - 15    Comment: RESULT CHECKED Performed at [facility], [place] [address] [place], [place] [address]   Blood gas, venous     Status: Abnormal   Collection Time: 11/22/17  5:54 PM  Result Value Ref Range   pH, Ven 7.167 (LL) 7.250 - 7.430    Comment: RBV DR.FLOYD AT 1809 BY T.BURGESS,RRT,RCP ON 11/22/2017    pCO2, Ven 34.7 (L) 44.0 - 60.0 mmHg   pO2, Ven 43.5 32.0 - 45.0 mmHg   Bicarbonate 12.1 (L) 20.0 - 28.0 mmol/L   Acid-base deficit 15.4 (H) 0.0 - 2.0 mmol/L   O2 Saturation 64.8 %   Patient temperature 98.6    Collection site VEIN    Drawn by COLLECTED BY NURSE    Sample type VENOUS     Comment: Performed at [place] [address] [place],  [address]  I-Stat Beta hCG blood, ED ([facility], [facility], [facility] only)     Status: None   Collection Time: 11/22/17  5:59 PM  Result Value Ref Range   I-stat hCG, quantitative <5.0 <5 mIU/mL   Comment 3            Comment:   GEST. AGE      CONC.  (mIU/mL)   <=1 WEEK        5 - 50     2 WEEKS       50 - 500     3 WEEKS       100 - 10,000     4 WEEKS     1,000 - 30,000        FEMALE AND NON-PREGNANT FEMALE:     LESS THAN 5 mIU/mL   I-Stat Chem 8, ED     Status: Abnormal   Collection Time: 11/22/17  6:01 PM  Result Value Ref Range   Sodium 134 (L) 135 - 145 mmol/L   Potassium 5.0 3.5 -  5.1 mmol/L   Chloride 104 101 - 111 mmol/L   BUN 19 6 - 20 mg/dL   Creatinine, Ser 1.20 (H) 0.44 - 1.00 mg/dL   Glucose, Bld 482 (H) 65 - 99 mg/dL   Calcium, Ion 1.16 1.15 - 1.40 mmol/L   TCO2 14 (L) 22 - 32 mmol/L   Hemoglobin 13.6 12.0 - 15.0 g/dL   HCT 40.0 36.0 - 46.0 %  CBG monitoring, ED     Status: Abnormal   Collection Time: 11/22/17  6:53 PM  Result Value Ref Range   Glucose-Capillary 500 (H) 65 - 99 mg/dL  CBG monitoring, ED     Status: Abnormal   Collection Time: 11/22/17  8:10 PM  Result Value Ref Range   Glucose-Capillary 459 (H) 65 - 99  mg/dL  CBG monitoring, ED     Status: Abnormal   Collection Time: 11/22/17  8:56 PM  Result Value Ref Range   Glucose-Capillary 454 (H) 65 - 99 mg/dL  CBG monitoring, ED     Status: Abnormal   Collection Time: 11/22/17  9:35 PM  Result Value Ref Range   Glucose-Capillary 380 (H) 65 - 99 mg/dL  CBC     Status: Abnormal   Collection Time: 11/22/17 10:03 PM  Result Value Ref Range   WBC 15.5 (H) 4.0 - 10.5 K/uL   RBC 4.17 3.87 - 5.11 MIL/uL   Hemoglobin 11.0 (L) 12.0 - 15.0 g/dL   HCT 36.7 36.0 - 46.0 %   MCV 88.0 78.0 - 100.0 fL   MCH 26.4 26.0 - 34.0 pg   MCHC 30.0 30.0 - 36.0 g/dL   RDW 17.8 (H) 11.5 - 15.5 %   Platelets 332 150 - 400 K/uL    Comment: Performed at [facility], [place] [address], [place], [place] [address]   Dg Abd Acute W/chest  Result Date: 11/22/2017 CLINICAL DATA:  Acute abdominal pain with nausea and vomiting. EXAM: DG ABDOMEN ACUTE W/ 1V CHEST COMPARISON:  11/05/2017 chest radiograph and prior studies. FINDINGS: Gas in the nondistended stomach and only a small amount of gas in a few small bowel loops identified. No dilated bowel loops or pneumoperitoneum identified. No suspicious calcifications noted. The cardiomediastinal silhouette is unremarkable. The lungs are clear. No airspace disease, pleural effusion or pneumothorax. No acute bony abnormality identified. IMPRESSION: 1. No evidence of acute abnormality. Electronically Signed   By: Margarette Canada M.D.   On: 11/22/2017 20:50    Pending Labs Unresulted Labs (From admission, onward)   Start     Ordered   11/29/17 0500  Creatinine, serum  (enoxaparin (LOVENOX)    CrCl >/= 30 ml/min)  Weekly,   R    Comments:  while on enoxaparin therapy    11/22/17 1934   11/22/17 1938  Hepatic function panel  Once,   R     11/22/17 1937   11/22/17 1934  Magnesium  Once,   R     11/22/17 1934   11/22/17 1934  Troponin I  Once,   R     11/22/17 1934   11/22/17 4235  Basic metabolic panel  STAT Now then  every 4 hours ,   STAT     11/22/17 1934   11/22/17 1719  Urinalysis, Routine w reflex microscopic  STAT,   STAT     11/22/17 1718      Vitals/Pain Today's Vitals   11/22/17 1708 11/22/17 1733 11/22/17 2117  BP: (!) 180/108  (!) 117/53  Pulse: (!) 117  (!) 115  Resp: 16  16  Temp: 99 F (37.2 C)    TempSrc: Oral    SpO2: 100%  100%  PainSc:  6      Isolation Precautions No active isolations  Medications Medications  lactated ringers infusion ( Intravenous Stopped 11/22/17 1935)  clonazepam (KLONOPIN) disintegrating tablet 0.5 mg (has no administration in time range)  mirtazapine (REMERON) tablet 15 mg (has no administration in time range)  pyridostigmine (MESTINON) tablet 30 mg (has no administration in time range)  tamsulosin (FLOMAX) capsule 0.4 mg (has no administration in time range)  0.9 %  sodium chloride infusion (has no administration in time range)  dextrose 5 %-0.45 % sodium chloride infusion (has no administration in time range)  insulin regular (NOVOLIN R,HUMULIN R) 100 Units in sodium chloride 0.9 % 100 mL (1 Units/mL) infusion (7.9 Units/hr Intravenous Rate/Dose Change 11/22/17 2101)  enoxaparin (LOVENOX) injection 40 mg (has no administration in time range)  hydrALAZINE (APRESOLINE) injection 10 mg (has no administration in time range)  lactated ringers bolus 2,000 mL (0 mLs Intravenous Stopped 11/22/17 1850)  haloperidol lactate (HALDOL) injection 5 mg (5 mg Intravenous Given 11/22/17 1821)    Mobility walks with person assist

## 2017-11-22 NOTE — ED Notes (Signed)
Bed: OH60 Expected date:  Expected time:  Means of arrival:  Comments: EMS-hyperglycemia

## 2017-11-22 NOTE — ED Notes (Signed)
LR paused because it is not recommended to run with insulin.

## 2017-11-22 NOTE — ED Triage Notes (Signed)
Pt is brought in by EMS, states her "gastroparesis is acting up and that her sugar is high." EMS CBG was 476, c/o nausea and vomiting.

## 2017-11-22 NOTE — ED Notes (Signed)
Bed: WA07 Expected date:  Expected time:  Means of arrival:  Comments: triage

## 2017-11-22 NOTE — H&P (Signed)
History and Physical    [name] NID:[id number] DOB: [birth year] DOA: 11/22/2017  PCP: [name], MD  Patient coming from: Home.  Chief Complaint: Nausea vomiting and elevated blood sugar.  HPI: [name] is a 52 y.o. female with history of diabetes mellitus type 1 recently discharged from hospital 10 days ago after being managed for DKA and gastroparesis presents to the ER with complaints of persistent nausea vomiting and diarrhea over the last 24 hours with elevated blood sugar.  Patient states he has been compliant with her insulin except that she did not take it today because of the persistent vomiting.  Also has been having diffuse crampy abdominal discomfort.  Denies any blood in the vomitus or diarrhea.  Denies any chest pain or shortness of breath.  ED Course: In the ER patient's blood sugar was around 497 with anion gap of 22 bicarb of 13 UA is pending.  Acute abdominal series unremarkable.  LFTs normal.  Patient started on IV insulin and fluids for DKA and admitted for further management along with patient having nausea vomiting diarrhea.  Review of Systems: As per HPI, rest all negative.   Past Medical History:  Diagnosis Date  . Depression   . Diabetes mellitus without complication ([place])   . Essential hypertension   . Gastroparesis   . GERD (gastroesophageal reflux disease)   . HLD (hyperlipidemia)     Past Surgical History:  Procedure Laterality Date  . COLONOSCOPY  09/27/2014   at [facility]  . ESOPHAGOGASTRODUODENOSCOPY  09/27/2014   at [facility], Dr Rolan Lipa. biospy neg for celiac, neg for H pylori.   . EYE SURGERY    . gailstones    . IR FLUORO GUIDE CV LINE RIGHT  02/01/2017  . IR FLUORO GUIDE CV LINE RIGHT  03/06/2017  . IR FLUORO GUIDE CV LINE RIGHT  03/25/2017  . IR GENERIC HISTORICAL  01/24/2016   IR FLUORO GUIDE CV LINE RIGHT 01/24/2016 Darrell K Allred, PA-C [facility]  . IR GENERIC HISTORICAL  01/24/2016   IR US GUIDE VASC ACCESS RIGHT 01/24/2016  Darrell K Allred, PA-C [facility]  . IR US GUIDE VASC ACCESS RIGHT  02/01/2017  . IR US GUIDE VASC ACCESS RIGHT  03/06/2017  . IR US GUIDE VASC ACCESS RIGHT  03/25/2017  . POSTERIOR VITRECTOMY AND MEMBRANE PEEL-LEFT EYE  09/28/2002  . POSTERIOR VITRECTOMY AND MEMBRANE PEEL-RIGHT EYE  03/16/2002  . RETINAL DETACHMENT SURGERY       reports that she has never smoked. She has never used smokeless tobacco. She reports that she does not drink alcohol or use drugs.  Allergies  Allergen Reactions  . Anesthetics, Amide Nausea And Vomiting  . Penicillins Diarrhea, Nausea And Vomiting and Other (See Comments)    Has patient had a PCN reaction causing immediate rash, facial/tongue/throat swelling, SOB or lightheadedness with hypotension: Yes Has patient had a PCN reaction causing severe rash involving mucus membranes or skin necrosis: No Has patient had a PCN reaction that required hospitalization No Has patient had a PCN reaction occurring within the last 10 years: Yes  If all of the above answers are "NO", then may proceed with Cephalosporin use.   . Buprenorphine Hcl Rash  . Encainide Nausea And Vomiting  . Metoclopramide Other (See Comments)    Dystonia, muscle rigidity.     Family History  Problem Relation Age of Onset  . Cystic fibrosis Mother   . Hypertension Father   . Diabetes Brother   . Hypertension  Maternal Grandmother     Prior to Admission medications   Medication Sig Start Date End Date Taking? Authorizing Provider  busPIRone (BUSPAR) 10 MG tablet Take 1 tablet (10 mg total) by mouth 3 (three) times daily. 07/11/17  Yes [name], MD  clonazePAM (KLONOPIN) 0.5 MG disintegrating tablet Take 1 tablet (0.5 mg total) by mouth 2 (two) times daily. 04/15/17  Yes Gerlene Fee, NP  cyanocobalamin (,VITAMIN B-12,) 1000 MCG/ML injection Inject 1 mL (1,000 mcg total) into the muscle daily. 1000 MCG's IM daily x3 days, then 1000 MCG's IM weekly x1 month, then 1000 MCG's IM  monthly. 11/12/17  Yes Eugenie Filler, MD  fluticasone [facility]) 50 MCG/ACT nasal spray Place 2 sprays into both nostrils daily. 11/12/17  Yes Eugenie Filler, MD  furosemide (LASIX) 20 MG tablet Take 20 mg by mouth daily as needed for fluid or edema.  05/20/17  Yes [provider]  insulin aspart (NOVOLOG) 100 UNIT/ML injection Inject 0-12 Units into the skin 4 (four) times daily -  before meals and at bedtime. Uses a sliding scale: 70-119 = 0 units, 120-150 = 2 units, 151-180 =  4 units, 181-210 = 5 units, 211-240 = 6 units, 241-270 = 8 units, 271-300 = 9 units, 301-330 = 10 units, 331-360 = 11 units, 361-400 = 12 units, greater than 400 notify MD.   Yes [provider]  insulin detemir (LEVEMIR) 100 UNIT/ML injection Inject 12 Units into the skin 2 (two) times daily.   Yes [provider]  Insulin Pen Needle (B-D UF III MINI PEN NEEDLES) 31G X 5 MM MISC Use to inject insulin twice daily. 07/10/17  Yes [name], MD  loratadine (CLARITIN) 10 MG tablet Take 1 tablet (10 mg total) by mouth daily. 11/12/17  Yes Eugenie Filler, MD  mirtazapine (REMERON) 15 MG tablet Take 1 tablet (15 mg total) by mouth at bedtime. 08/01/17  Yes Zehr, Janett Billow D, PA-C  ondansetron (ZOFRAN-ODT) 8 MG disintegrating tablet Take 1 tablet (8 mg total) by mouth every 8 (eight) hours as needed for nausea or vomiting. 07/02/17  Yes [name], MD  pantoprazole (PROTONIX) 40 MG tablet Take 1 tablet (40 mg total) by mouth daily. 07/02/17  Yes [name], MD  polyethylene glycol (MIRALAX / GLYCOLAX) packet Take 17 g by mouth daily as needed for mild constipation.  04/11/17  Yes [provider]  pregabalin (LYRICA) 150 MG capsule Take 1 capsule (150 mg total) by mouth daily. 10/16/17  Yes [name], MD  pyridostigmine (MESTINON) 60 MG tablet Take 0.5 tablets (30 mg total) by mouth 3 (three) times daily. Take 1/2 with meals 11/11/17  Yes Eugenie Filler, MD  Syringe/Needle,  Disp, (SYRINGE 3CC/22GX1-1/2") 22G X 1-1/2" 3 ML MISC Inject 1,000 mcg into the muscle daily. 11/11/17  Yes Eugenie Filler, MD  tamsulosin (FLOMAX) 0.4 MG CAPS capsule Take 1 capsule (0.4 mg total) by mouth daily after supper. 11/11/17  Yes Eugenie Filler, MD    Physical Exam: Vitals:   11/22/17 1708  BP: (!) 180/108  Pulse: (!) 117  Resp: 16  Temp: 99 F (37.2 C)  TempSrc: Oral  SpO2: 100%      Constitutional: Moderately built and nourished. Vitals:   11/22/17 1708  BP: (!) 180/108  Pulse: (!) 117  Resp: 16  Temp: 99 F (37.2 C)  TempSrc: Oral  SpO2: 100%   Eyes: Anicteric no pallor. ENMT: No discharge from the ears eyes nose or mouth.  Neck: No mass felt.  No neck rigidity. Respiratory: No rhonchi or crepitations. Cardiovascular: S1-S2 heard no murmurs appreciated. Abdomen: Soft nontender bowel sounds present. Musculoskeletal: No edema.  No joint effusion. Skin: No rash.  Skin appears warm. Neurologic: Alert awake oriented to time place and person.  Moves all extremities. Psychiatric: Appears normal.  Normal affect.   Labs on Admission: I have personally reviewed following labs and imaging studies  CBC: Recent Labs  Lab 11/22/17 1751 11/22/17 1801  WBC 13.0*  --   NEUTROABS 11.8*  --   HGB 11.6* 13.6  HCT 38.2 40.0  MCV 87.4  --   PLT 401*  --    Basic Metabolic Panel: Recent Labs  Lab 11/22/17 1751 11/22/17 1801  NA 136 134*  K 5.2* 5.0  CL 101 104  CO2 13*  --   GLUCOSE 497* 482*  BUN 22* 19  CREATININE 1.58* 1.20*  CALCIUM 9.6  --    GFR: Estimated Creatinine Clearance: 51.9 mL/min (A) (by C-G formula based on SCr of 1.2 mg/dL (H)). Liver Function Tests: No results for input(s): AST, ALT, ALKPHOS, BILITOT, PROT, ALBUMIN in the last 168 hours. No results for input(s): LIPASE, AMYLASE in the last 168 hours. No results for input(s): AMMONIA in the last 168 hours. Coagulation Profile: No results for input(s): INR, PROTIME in the last  168 hours. Cardiac Enzymes: No results for input(s): CKTOTAL, CKMB, CKMBINDEX, TROPONINI in the last 168 hours. BNP (last 3 results) No results for input(s): PROBNP in the last 8760 hours. HbA1C: No results for input(s): HGBA1C in the last 72 hours. CBG: Recent Labs  Lab 11/22/17 1853  GLUCAP 500*   Lipid Profile: No results for input(s): CHOL, HDL, LDLCALC, TRIG, CHOLHDL, LDLDIRECT in the last 72 hours. Thyroid Function Tests: No results for input(s): TSH, T4TOTAL, FREET4, T3FREE, THYROIDAB in the last 72 hours. Anemia Panel: No results for input(s): VITAMINB12, FOLATE, FERRITIN, TIBC, IRON, RETICCTPCT in the last 72 hours. Urine analysis:    Component Value Date/Time   COLORURINE YELLOW 11/06/2017 [place] 11/06/2017 1757   LABSPEC 1.014 11/06/2017 1757   PHURINE 5.0 11/06/2017 1757   GLUCOSEU >=500 (A) 11/06/2017 1757   HGBUR SMALL (A) 11/06/2017 1757   BILIRUBINUR NEGATIVE 11/06/2017 1757   BILIRUBINUR neg 01/17/2017 1533   KETONESUR 80 (A) 11/06/2017 1757   PROTEINUR 30 (A) 11/06/2017 1757   UROBILINOGEN 0.2 01/17/2017 1533   UROBILINOGEN 0.2 04/27/2015 1608   NITRITE NEGATIVE 11/06/2017 1757   LEUKOCYTESUR NEGATIVE 11/06/2017 1757   Sepsis Labs: @LABRCNTIP (procalcitonin:4,lacticidven:4) )No results found for this or any previous visit (from the past 240 hour(s)).   Radiological Exams on Admission: No results found.  Assessment/Plan Active Problems:   Diabetic gastroparesis ([facility])   Essential hypertension   DKA, type 1 ([facility])   Severe protein-calorie malnutrition ([place])   DKA (diabetic ketoacidoses) ([place])   ARF (acute renal failure) ([place])    1. Diabetic ketoacidosis -and type 1 diabetes.  Likely precipitated by persistent vomiting and diarrhea.  Patient's last hemoglobin A1c done 2 weeks ago was 10.6.  Not sure of compliance.  Patient on IV insulin infusion and aggressive fluid hydration.  Follow metabolic panel closely until anion gap gets  corrected and changed to subcutaneous insulin once anion gap is corrected.  Closely follow intake output.  Note that patient had required Lasix during last day due to fluid overload. 2. Acute renal failure likely from dehydration from nausea vomiting and diarrhea.  UA is pending.  Continue with hydration follow metabolic panel. 3. Nausea vomiting and diarrhea could be from gastroenteritis or gastroparesis.  GI pathogen panel is pending.  Patient takes Mestinon which patient states she has been compliant with.  Patient follows up with GI at [facility]. 4. Hypertensive urgency -did not see any antihypertensives in patient's medication list.  For now I have kept patient on PRN IV hydralazine.  Closely follow blood pressure trends. 5. Anemia secondary to iron deficiency and B12 deficiency.  Patient was given B12 injections during last day.  Will need B12 injection every month for at least the next few weeks.  Iron supplements to be continued once patient can take orally.  Follow CBC. 6. History of urinary retention.  Has been on Foley last admission which is removed.  During this admission patient again does have urinary retention.  In and out cath was done.  Repeat bladder scan will be ordered in 3 hours and if still has urine retention will need Foley catheter.  Patient is on Flomax. 7. History of fluid overload during last admission 2 weeks ago.  Takes Lasix.  Presently on hold due to dehydration.  Last 2D echo done in 2016 was showing EF of 55 to 60%.  Closely monitor respiratory status.   DVT prophylaxis: Lovenox. Code Status: Full code. Family Communication: Discussed with patient. Disposition Plan: Home. Consults called: None. Admission status: Inpatient.   Rise Patience MD [facility] Pager [phone].  If 7PM-7AM, please contact night-coverage [email or web address] Password [facility]  11/22/2017, 7:35 PM

## 2017-11-22 NOTE — ED Provider Notes (Signed)
[facility] DEPT Provider Note   CSN: [id number] Arrival date & time: 11/22/17  1707     History   Chief Complaint Chief Complaint  Patient presents with  . Hyperglycemia  . N/V    HPI [name] is a 52 y.o. female.  30 yoF with a chief complaint of hyperglycemia vomiting and diarrhea.  Going on for the past couple days.  Patient has a history of type 1 diabetes is poorly controlled with recurrent episodes of diabetic ketoacidosis.  She thinks this feels like that as well as her gastroparesis.  Not able to keep anything down for the past couple days.  Denies fevers or chills.  The history is provided by the patient.  Hyperglycemia  Associated symptoms: abdominal pain, diaphoresis, nausea and vomiting   Associated symptoms: no chest pain, no dizziness, no dysuria, no fever and no shortness of breath   Illness  This is a new problem. The current episode started 2 days ago. The problem occurs constantly. The problem has not changed since onset.Associated symptoms include abdominal pain. Pertinent negatives include no chest pain, no headaches and no shortness of breath. Nothing aggravates the symptoms. Nothing relieves the symptoms. She has tried nothing for the symptoms. The treatment provided no relief.    Past Medical History:  Diagnosis Date  . Depression   . Diabetes mellitus without complication ([place])   . Essential hypertension   . Gastroparesis   . GERD (gastroesophageal reflux disease)   . HLD (hyperlipidemia)     Patient Active Problem List   Diagnosis Date Noted  . Dehydration   . ARF (acute renal failure) ([place])   . Hyperkalemia 11/05/2017  . Constipation 08/01/2017  . MDD (major depressive disorder), single episode, severe , no psychosis ([place])   . DKA (diabetic ketoacidoses) ([place]) 06/16/2017  . Severe protein-calorie malnutrition ([place]) 04/17/2017  . Acute dystonic reaction due to drugs 04/01/2017  . Serotonin syndrome  03/25/2017  . Tardive dyskinesia   . Tachycardia 03/16/2017  . Diarrhea 03/05/2017  . DKA, type 1 ([place]) 03/18/2016  . Noncompliance with treatment plan 03/13/2016  . Essential hypertension 01/24/2016  . Leukocytosis 01/24/2016  . Abnormal ECG 11/27/2015  . HLD (hyperlipidemia)   . GERD (gastroesophageal reflux disease)   . AKI (acute kidney injury) ([place])   . Anemia, iron deficiency   . Vitamin B12 deficiency 08/16/2015  . Diabetic gastroparesis ([place])   . Diabetic neuropathy, type I diabetes mellitus ([place]) 05/18/2015  . Anxiety and depression 05/18/2015    Past Surgical History:  Procedure Laterality Date  . COLONOSCOPY  09/27/2014   at [facility]  . ESOPHAGOGASTRODUODENOSCOPY  09/27/2014   at [facility], Dr Rolan Lipa. biospy neg for celiac, neg for H pylori.   . EYE SURGERY    . gailstones    . IR FLUORO GUIDE CV LINE RIGHT  02/01/2017  . IR FLUORO GUIDE CV LINE RIGHT  03/06/2017  . IR FLUORO GUIDE CV LINE RIGHT  03/25/2017  . IR GENERIC HISTORICAL  01/24/2016   IR FLUORO GUIDE CV LINE RIGHT 01/24/2016 Darrell K Allred, PA-C [facility]-INTERV RAD  . IR GENERIC HISTORICAL  01/24/2016   IR US GUIDE VASC ACCESS RIGHT 01/24/2016 Darrell K Allred, PA-C [facility]INTER[facility]  . IR US GUIDE VASC ACCESS RIGHT  02/01/2017  . IR US GUIDE VASC ACCESS RIGHT  03/06/2017  . IR US GUIDE VASC ACCESS RIGHT  03/25/2017  . POSTERIOR VITRECTOMY AND MEMBRANE PEEL-LEFT EYE  09/28/2002  . POSTERIOR VITRECTOMY AND  MEMBRANE PEEL-RIGHT EYE  03/16/2002  . RETINAL DETACHMENT SURGERY       OB History   None      Home Medications    Prior to Admission medications   Medication Sig Start Date End Date Taking? Authorizing Provider  busPIRone (BUSPAR) 10 MG tablet Take 1 tablet (10 mg total) by mouth 3 (three) times daily. 07/11/17  Yes [name], MD  clonazePAM (KLONOPIN) 0.5 MG disintegrating tablet Take 1 tablet (0.5 mg total) by mouth 2 (two) times daily. 04/15/17  Yes Gerlene Fee, NP  cyanocobalamin (,VITAMIN B-12,) 1000  MCG/ML injection Inject 1 mL (1,000 mcg total) into the muscle daily. 1000 MCG's IM daily x3 days, then 1000 MCG's IM weekly x1 month, then 1000 MCG's IM monthly. 11/12/17  Yes Eugenie Filler, MD  fluticasone [facility]) 50 MCG/ACT nasal spray Place 2 sprays into both nostrils daily. 11/12/17  Yes Eugenie Filler, MD  furosemide (LASIX) 20 MG tablet Take 20 mg by mouth daily as needed for fluid or edema.  05/20/17  Yes [provider]  insulin aspart (NOVOLOG) 100 UNIT/ML injection Inject 0-12 Units into the skin 4 (four) times daily -  before meals and at bedtime. Uses a sliding scale: 70-119 = 0 units, 120-150 = 2 units, 151-180 =  4 units, 181-210 = 5 units, 211-240 = 6 units, 241-270 = 8 units, 271-300 = 9 units, 301-330 = 10 units, 331-360 = 11 units, 361-400 = 12 units, greater than 400 notify MD.   Yes [provider]  insulin detemir (LEVEMIR) 100 UNIT/ML injection Inject 12 Units into the skin 2 (two) times daily.   Yes [provider]  Insulin Pen Needle (B-D UF III MINI PEN NEEDLES) 31G X 5 MM MISC Use to inject insulin twice daily. 07/10/17  Yes [name], MD  loratadine (CLARITIN) 10 MG tablet Take 1 tablet (10 mg total) by mouth daily. 11/12/17  Yes Eugenie Filler, MD  mirtazapine (REMERON) 15 MG tablet Take 1 tablet (15 mg total) by mouth at bedtime. 08/01/17  Yes Zehr, Janett Billow D, PA-C  ondansetron (ZOFRAN-ODT) 8 MG disintegrating tablet Take 1 tablet (8 mg total) by mouth every 8 (eight) hours as needed for nausea or vomiting. 07/02/17  Yes [name], MD  pantoprazole (PROTONIX) 40 MG tablet Take 1 tablet (40 mg total) by mouth daily. 07/02/17  Yes [name], MD  polyethylene glycol (MIRALAX / GLYCOLAX) packet Take 17 g by mouth daily as needed for mild constipation.  04/11/17  Yes [provider]  pregabalin (LYRICA) 150 MG capsule Take 1 capsule (150 mg total) by mouth daily. 10/16/17  Yes [name], MD  pyridostigmine (MESTINON)  60 MG tablet Take 0.5 tablets (30 mg total) by mouth 3 (three) times daily. Take 1/2 with meals 11/11/17  Yes Eugenie Filler, MD  Syringe/Needle, Disp, (SYRINGE 3CC/22GX1-1/2") 22G X 1-1/2" 3 ML MISC Inject 1,000 mcg into the muscle daily. 11/11/17  Yes Eugenie Filler, MD  tamsulosin (FLOMAX) 0.4 MG CAPS capsule Take 1 capsule (0.4 mg total) by mouth daily after supper. 11/11/17  Yes Eugenie Filler, MD    Family History Family History  Problem Relation Age of Onset  . Cystic fibrosis Mother   . Hypertension Father   . Diabetes Brother   . Hypertension Maternal Grandmother     Social History Social History   Tobacco Use  . Smoking status: Never Smoker  . Smokeless tobacco: Never Used  Substance Use Topics  .  Alcohol use: No  . Drug use: No     Allergies   Anesthetics, amide; Penicillins; Buprenorphine hcl; Encainide; and Metoclopramide   Review of Systems Review of Systems  Constitutional: Positive for diaphoresis. Negative for chills and fever.  HENT: Negative for congestion and rhinorrhea.   Eyes: Negative for redness and visual disturbance.  Respiratory: Negative for shortness of breath and wheezing.   Cardiovascular: Negative for chest pain and palpitations.  Gastrointestinal: Positive for abdominal pain, nausea and vomiting.  Genitourinary: Negative for dysuria and urgency.  Musculoskeletal: Negative for arthralgias and myalgias.  Skin: Negative for pallor and wound.  Neurological: Negative for dizziness and headaches.     Physical Exam Updated Vital Signs BP (!) 180/108 (BP Location: Right Arm)   Pulse (!) 117   Temp 99 F (37.2 C) (Oral)   Resp 16   SpO2 100%   Physical Exam  Constitutional: She is oriented to person, place, and time. She appears well-developed and well-nourished. She appears distressed.  HENT:  Head: Normocephalic and atraumatic.  Eyes: Pupils are equal, round, and reactive to light. EOM are normal.  Neck: Normal range of  motion. Neck supple.  Cardiovascular: Regular rhythm. Tachycardia present. Exam reveals no gallop and no friction rub.  No murmur heard. Pulmonary/Chest: Effort normal. Tachypnea noted. She has no wheezes. She has no rales.  Abdominal: Soft. She exhibits no distension. There is no tenderness.  Musculoskeletal: She exhibits no edema or tenderness.  Neurological: She is alert and oriented to person, place, and time.  Skin: Skin is warm. She is diaphoretic.  Psychiatric: She has a normal mood and affect. Her behavior is normal.  Nursing note and vitals reviewed.    ED Treatments / Results  Labs (all labs ordered are listed, but only abnormal results are displayed) Labs Reviewed  BLOOD GAS, VENOUS - Abnormal; Notable for the following components:      Result Value   pH, Ven 7.167 (*)    pCO2, Ven 34.7 (*)    Bicarbonate 12.1 (*)    Acid-base deficit 15.4 (*)    All other components within normal limits  CBC WITH DIFFERENTIAL/PLATELET - Abnormal; Notable for the following components:   WBC 13.0 (*)    Hemoglobin 11.6 (*)    RDW 17.7 (*)    Platelets 401 (*)    Neutro Abs 11.8 (*)    All other components within normal limits  BASIC METABOLIC PANEL - Abnormal; Notable for the following components:   Potassium 5.2 (*)    CO2 13 (*)    Glucose, Bld 497 (*)    BUN 22 (*)    Creatinine, Ser 1.58 (*)    GFR calc non Af Amer 37 (*)    GFR calc Af Amer 43 (*)    Anion gap 22 (*)    All other components within normal limits  I-STAT CHEM 8, ED - Abnormal; Notable for the following components:   Sodium 134 (*)    Creatinine, Ser 1.20 (*)    Glucose, Bld 482 (*)    TCO2 14 (*)    All other components within normal limits  CBG MONITORING, ED - Abnormal; Notable for the following components:   Glucose-Capillary 500 (*)    All other components within normal limits  CBG MONITORING, ED - Abnormal; Notable for the following components:   Glucose-Capillary 459 (*)    All other components  within normal limits  URINALYSIS, ROUTINE W REFLEX MICROSCOPIC  BASIC METABOLIC PANEL  BASIC  METABOLIC PANEL  BASIC METABOLIC PANEL  BASIC METABOLIC PANEL  CBC  MAGNESIUM  TROPONIN I  HEPATIC FUNCTION PANEL  I-STAT BETA HCG BLOOD, ED ([facility], [facility], [facility] ONLY)    EKG None  Radiology No results found.  Procedures Procedures (including critical care time) Procedure note: Ultrasound Guided Peripheral IV Ultrasound guided peripheral 1.88 inch angiocath IV placement performed by me. Indications: Nursing unable to place IV. Details: The antecubital fossa and upper arm were evaluated with a multifrequency linear probe. Patent brachial veins were noted. 1 attempt was made to cannulate a vein under realtime US guidance with successful cannulation of the vein and catheter placement. There is return of non-pulsatile dark red blood. The patient tolerated the procedure well without complications. Images archived electronically.  CPT codes: [phone] and [phone]  Medications Ordered in ED Medications  lactated ringers infusion ( Intravenous Stopped 11/22/17 1935)  clonazepam (KLONOPIN) disintegrating tablet 0.5 mg (has no administration in time range)  mirtazapine (REMERON) tablet 15 mg (has no administration in time range)  pyridostigmine (MESTINON) tablet 30 mg (has no administration in time range)  tamsulosin (FLOMAX) capsule 0.4 mg (has no administration in time range)  0.9 %  sodium chloride infusion (has no administration in time range)  dextrose 5 %-0.45 % sodium chloride infusion (has no administration in time range)  insulin regular (NOVOLIN R,HUMULIN R) 100 Units in sodium chloride 0.9 % 100 mL (1 Units/mL) infusion (4.4 Units/hr Intravenous New Bag/Given 11/22/17 1935)  enoxaparin (LOVENOX) injection 40 mg (has no administration in time range)  hydrALAZINE (APRESOLINE) injection 10 mg (has no administration in time range)  hydrALAZINE (APRESOLINE) injection 10 mg (has no administration in time  range)  lactated ringers bolus 2,000 mL (0 mLs Intravenous Stopped 11/22/17 1850)  haloperidol lactate (HALDOL) injection 5 mg (5 mg Intravenous Given 11/22/17 1821)     Initial Impression / Assessment and Plan / ED Course  I have reviewed the triage vital signs and the nursing notes.  Pertinent labs & imaging results that were available during my care of the patient were reviewed by me and considered in my medical decision making (see chart for details).     52 yo F with a cc of hyperglycemia.  Likely in DKA by hx and PE. Will obtain lab, give fluids.   DKA on labs.  Started on insulin gtt.  AKI as well.  Nat anemic, not pregnant.  Admit.   CRITICAL CARE Performed by: Cecilio Asper   Total critical care time: 80 minutes  Critical care time was exclusive of separately billable procedures and treating other patients.  Critical care was necessary to treat or prevent imminent or life-threatening deterioration.  Critical care was time spent personally by me on the following activities: development of treatment plan with patient and/or surrogate as well as nursing, discussions with consultants, evaluation of patient's response to treatment, examination of patient, obtaining history from patient or surrogate, ordering and performing treatments and interventions, ordering and review of laboratory studies, ordering and review of radiographic studies, pulse oximetry and re-evaluation of patient's condition.  The patients results and plan were reviewed and discussed.   Any x-rays performed were independently reviewed by myself.   Differential diagnosis were considered with the presenting HPI.  Medications  lactated ringers infusion ( Intravenous Stopped 11/22/17 1935)  clonazepam (KLONOPIN) disintegrating tablet 0.5 mg (has no administration in time range)  mirtazapine (REMERON) tablet 15 mg (has no administration in time range)  pyridostigmine (MESTINON) tablet 30 mg (has  no  administration in time range)  tamsulosin (FLOMAX) capsule 0.4 mg (has no administration in time range)  0.9 %  sodium chloride infusion (has no administration in time range)  dextrose 5 %-0.45 % sodium chloride infusion (has no administration in time range)  insulin regular (NOVOLIN R,HUMULIN R) 100 Units in sodium chloride 0.9 % 100 mL (1 Units/mL) infusion (4.4 Units/hr Intravenous New Bag/Given 11/22/17 1935)  enoxaparin (LOVENOX) injection 40 mg (has no administration in time range)  hydrALAZINE (APRESOLINE) injection 10 mg (has no administration in time range)  hydrALAZINE (APRESOLINE) injection 10 mg (has no administration in time range)  lactated ringers bolus 2,000 mL (0 mLs Intravenous Stopped 11/22/17 1850)  haloperidol lactate (HALDOL) injection 5 mg (5 mg Intravenous Given 11/22/17 1821)    Vitals:   11/22/17 1708  BP: (!) 180/108  Pulse: (!) 117  Resp: 16  Temp: 99 F (37.2 C)  TempSrc: Oral  SpO2: 100%    Final diagnoses:  Diabetic ketoacidosis without coma associated with type 1 diabetes mellitus ([place])    Admission/ observation were discussed with the admitting physician, patient and/or family and they are comfortable with the plan.    Final Clinical Impressions(s) / ED Diagnoses   Final diagnoses:  Diabetic ketoacidosis without coma associated with type 1 diabetes mellitus [facility])    ED Discharge Orders    None       Deno Etienne, DO 11/22/17 2011

## 2017-11-23 DIAGNOSIS — N179 Acute kidney failure, unspecified: Secondary | ICD-10-CM

## 2017-11-23 DIAGNOSIS — K3184 Gastroparesis: Secondary | ICD-10-CM

## 2017-11-23 DIAGNOSIS — I10 Essential (primary) hypertension: Secondary | ICD-10-CM

## 2017-11-23 DIAGNOSIS — R112 Nausea with vomiting, unspecified: Secondary | ICD-10-CM

## 2017-11-23 DIAGNOSIS — E1010 Type 1 diabetes mellitus with ketoacidosis without coma: Principal | ICD-10-CM

## 2017-11-23 DIAGNOSIS — E1143 Type 2 diabetes mellitus with diabetic autonomic (poly)neuropathy: Secondary | ICD-10-CM

## 2017-11-23 LAB — BASIC METABOLIC PANEL
ANION GAP: 10 (ref 5–15)
ANION GAP: 8 (ref 5–15)
ANION GAP: 10 (ref 5–15)
BUN: 24 mg/dL — ABNORMAL HIGH (ref 6–20)
BUN: 24 mg/dL — ABNORMAL HIGH (ref 6–20)
BUN: 24 mg/dL — ABNORMAL HIGH (ref 6–20)
CALCIUM: 8.8 mg/dL — AB (ref 8.9–10.3)
CHLORIDE: 110 mmol/L (ref 101–111)
CHLORIDE: 111 mmol/L (ref 101–111)
CHLORIDE: 112 mmol/L — AB (ref 101–111)
CO2: 19 mmol/L — AB (ref 22–32)
CO2: 20 mmol/L — AB (ref 22–32)
CO2: 19 mmol/L — AB (ref 22–32)
Calcium: 9 mg/dL (ref 8.9–10.3)
Calcium: 8.7 mg/dL — ABNORMAL LOW (ref 8.9–10.3)
Creatinine, Ser: 1.57 mg/dL — ABNORMAL HIGH (ref 0.44–1.00)
Creatinine, Ser: 1.63 mg/dL — ABNORMAL HIGH (ref 0.44–1.00)
Creatinine, Ser: 1.51 mg/dL — ABNORMAL HIGH (ref 0.44–1.00)
GFR calc non Af Amer: 37 mL/min — ABNORMAL LOW (ref >60)
GFR calc non Af Amer: 36 mL/min — ABNORMAL LOW (ref >60)
GFR calc non Af Amer: 39 mL/min — ABNORMAL LOW (ref >60)
GFR, EST AFRICAN AMERICAN: 43 mL/min — AB (ref >60)
GFR, EST AFRICAN AMERICAN: 41 mL/min — AB (ref >60)
GFR, EST AFRICAN AMERICAN: 45 mL/min — AB (ref >60)
Glucose, Bld: 206 mg/dL — ABNORMAL HIGH (ref 65–99)
Glucose, Bld: 139 mg/dL — ABNORMAL HIGH (ref 65–99)
Glucose, Bld: 175 mg/dL — ABNORMAL HIGH (ref 65–99)
POTASSIUM: 5 mmol/L (ref 3.5–5.1)
Potassium: 4 mmol/L (ref 3.5–5.1)
Potassium: 4.3 mmol/L (ref 3.5–5.1)
Sodium: 139 mmol/L (ref 135–145)
Sodium: 139 mmol/L (ref 135–145)
Sodium: 141 mmol/L (ref 135–145)

## 2017-11-23 LAB — GASTROINTESTINAL PANEL BY PCR, STOOL (REPLACES STOOL CULTURE)
ADENOVIRUS F40/41: NOT DETECTED
Astrovirus: NOT DETECTED
CRYPTOSPORIDIUM: NOT DETECTED
Campylobacter species: NOT DETECTED
Cyclospora cayetanensis: NOT DETECTED
ENTAMOEBA HISTOLYTICA: NOT DETECTED
ENTEROAGGREGATIVE E COLI (EAEC): NOT DETECTED
ENTEROPATHOGENIC E COLI (EPEC): NOT DETECTED
Enterotoxigenic E coli (ETEC): NOT DETECTED
GIARDIA LAMBLIA: NOT DETECTED
Norovirus GI/GII: NOT DETECTED
Plesimonas shigelloides: NOT DETECTED
Rotavirus A: NOT DETECTED
SALMONELLA SPECIES: NOT DETECTED
SHIGELLA/ENTEROINVASIVE E COLI (EIEC): NOT DETECTED
Sapovirus (I, II, IV, and V): NOT DETECTED
Shiga like toxin producing E coli (STEC): NOT DETECTED
VIBRIO CHOLERAE: NOT DETECTED
Vibrio species: NOT DETECTED
YERSINIA ENTEROCOLITICA: NOT DETECTED

## 2017-11-23 LAB — GLUCOSE, CAPILLARY
GLUCOSE-CAPILLARY: 127 mg/dL — AB (ref 65–99)
GLUCOSE-CAPILLARY: 130 mg/dL — AB (ref 65–99)
GLUCOSE-CAPILLARY: 139 mg/dL — AB (ref 65–99)
GLUCOSE-CAPILLARY: 215 mg/dL — AB (ref 65–99)
GLUCOSE-CAPILLARY: 261 mg/dL — AB (ref 65–99)
Glucose-Capillary: 169 mg/dL — ABNORMAL HIGH (ref 65–99)
Glucose-Capillary: 164 mg/dL — ABNORMAL HIGH (ref 65–99)
Glucose-Capillary: 127 mg/dL — ABNORMAL HIGH (ref 65–99)
Glucose-Capillary: 181 mg/dL — ABNORMAL HIGH (ref 65–99)
Glucose-Capillary: 251 mg/dL — ABNORMAL HIGH (ref 65–99)
Glucose-Capillary: 217 mg/dL — ABNORMAL HIGH (ref 65–99)

## 2017-11-23 LAB — MRSA PCR SCREENING: MRSA BY PCR: NEGATIVE

## 2017-11-23 LAB — LIPASE, BLOOD: Lipase: 25 U/L (ref 11–51)

## 2017-11-23 MED ORDER — LOPERAMIDE HCL 2 MG PO CAPS: 2.0000 mg | ORAL_CAPSULE | ORAL | Status: DC | PRN

## 2017-11-23 MED ORDER — SODIUM CHLORIDE 0.9 % IV SOLN
INTRAVENOUS | Status: DC
  Administered 2017-11-23: 11:00:00 via INTRAVENOUS

## 2017-11-23 MED ORDER — INSULIN DETEMIR 100 UNIT/ML ~~LOC~~ SOLN
12.0000 [IU] | Freq: Two times a day (BID) | SUBCUTANEOUS | Status: DC
  Administered 2017-11-23 (×2): 12 [IU] via SUBCUTANEOUS
  Filled 2017-11-23 (×4): qty 0.12

## 2017-11-23 MED ORDER — INSULIN ASPART 100 UNIT/ML ~~LOC~~ SOLN
0.0000 [IU] | SUBCUTANEOUS | Status: DC
  Administered 2017-11-23: 12 [IU] via SUBCUTANEOUS
  Administered 2017-11-23: 8 [IU] via SUBCUTANEOUS
  Administered 2017-11-23: 4 [IU] via SUBCUTANEOUS
  Administered 2017-11-24: 2 [IU] via SUBCUTANEOUS
  Administered 2017-11-24 (×2): 4 [IU] via SUBCUTANEOUS

## 2017-11-23 MED ORDER — SODIUM CHLORIDE 0.9 % IV SOLN
INTRAVENOUS | Status: AC
  Administered 2017-11-23: 1000 mL via INTRAVENOUS

## 2017-11-23 MED ORDER — LOPERAMIDE HCL 2 MG PO CAPS
4.0000 mg | ORAL_CAPSULE | Freq: Once | ORAL | Status: AC
  Administered 2017-11-23: 4 mg via ORAL
  Filled 2017-11-23: qty 2

## 2017-11-23 NOTE — Progress Notes (Signed)
PROGRESS NOTE    [name]  STM:[id number] DOB: 15-Mar-1966 DOA: 11/22/2017 PCP: [name], MD    Brief Narrative:  [name] is a 52 y.o. female with history of diabetes mellitus type 1 recently discharged from hospital 10 days ago after being managed for DKA and gastroparesis presents to the ER with complaints of persistent nausea vomiting and diarrhea over the last 24 hours with elevated blood sugar.  Patient states he has been compliant with her insulin except that she did not take it today because of the persistent vomiting.  Also has been having diffuse crampy abdominal discomfort.  Denies any blood in the vomitus or diarrhea.  Denies any chest pain or shortness of breath.  ED Course: In the ER patient's blood sugar was around 497 with anion gap of 22 bicarb of 13 UA is pending.  Acute abdominal series unremarkable.  LFTs normal.  Patient started on IV insulin and fluids for DKA and admitted for further management along with patient having nausea vomiting diarrhea.     Assessment & Plan:   Principal Problem:   DKA, type 1 ([place]) Active Problems:   Nausea & vomiting   Diabetic gastroparesis ([place])   Essential hypertension   Severe protein-calorie malnutrition ([place])   DKA (diabetic ketoacidoses) ([place])   ARF (acute renal failure) ([place])  #1 diabetic ketoacidosis/type 1 diabetes Poorly controlled diabetes hemoglobin A1c of 10.6.  Likely precipitated by persistent nausea vomiting and diarrhea.  Patient states has been compliant with her medications except 1 to 2 days prior to admission when she was very nauseous vomiting and had diarrhea.  Chest x-ray done negative for any acute infiltrate.  Urinalysis is nitrite negative, leukocytes negative.  Patient noted to have 80 ketones in the urine.  Greater than 500 glucose in the urine.  Patient noted to have a anion gap of 22 on presentation.  Patient was placed on the glucose stabilizer, IV fluids and subsequently has been  transitioned to home regimen of Levemir 12 units twice daily.  CBGs improved with last CBG at 127.  Continue sliding scale insulin.  2.  Diabetic gastroparesis Being followed at [facility].  Patient on Mestinon for diabetic gastroparesis which we will continue.  Follow.  3.  Acute renal failure Likely secondary to a prerenal azotemia secondary to problem #1.  Patient noted on admission to be hypotensive with systolics in the 89Q.  Continue IV fluids.  Follow.  4.  Hypertension Blood pressure borderline.  IV fluids.  Follow.  5.  Nausea/vomiting/diarrhea Could be secondary to gastroenteritis versus gastroparesis.  GI pathogen panel pending.  Continue home regimen Mestinon.  6.  Iron deficiency anemia/vitamin B12 deficiency Continue vitamin B12 injections monthly for the next few months with outpatient follow-up.  Will need oral iron supplementation to be started when oral intake improves.  7.  History of urinary retention Continue Flomax.  Monitor.  8.  Dehydration IV fluids.   DVT prophylaxis: Lovenox Code Status: Full Family Communication: Updated patient.  No family present. Disposition Plan: Transfer to [place].   Consultants:   None  Procedures:   Chest x-ray 11/22/2017  Antimicrobials:   None   Subjective: Patient states not feeling too well.  Patient with some nausea.  No further emesis.  Patient with multiple loose stools per RN.  Patient only tolerating about 20 to 25% of her breakfast.  Objective: Vitals:   11/23/17 0700 11/23/17 0800 11/23/17 0900 11/23/17 1000  BP: (!) 104/30 (!) 119/28 Marland Kitchen)  141/34 (!) 117/34  Pulse: 89 (!) 102 (!) 104 94  Resp: 16 17 17 17   Temp:      TempSrc:      SpO2: 100% 100% 100% 99%  Weight:      Height:        Intake/Output Summary (Last 24 hours) at 11/23/2017 1109 Last data filed at 11/23/2017 0900 Gross per 24 hour  Intake 3140.05 ml  Output 1125 ml  Net 2015.05 ml   Filed Weights    11/22/17 2322  Weight: 68.8 kg (151 lb 10.8 oz)    Examination:  General exam:NAD Respiratory system: Clear to auscultation. Respiratory effort normal. Cardiovascular system: S1 & S2 heard, RRR. No JVD, murmurs, rubs, gallops or clicks. No pedal edema. Gastrointestinal system: Abdomen is nondistended, soft and nontender. No organomegaly or masses felt. Normal bowel sounds heard. Central nervous system: Alert and oriented. No focal neurological deficits. Extremities: Symmetric 5 x 5 power. Skin: No rashes, lesions or ulcers Psychiatry: Judgement and insight appear normal. Mood & affect appropriate.     Data Reviewed: I have personally reviewed following labs and imaging studies  CBC: Recent Labs  Lab 11/22/17 1751 11/22/17 1801 11/22/17 2203  WBC 13.0*  --  15.5*  NEUTROABS 11.8*  --   --   HGB 11.6* 13.6 11.0*  HCT 38.2 40.0 36.7  MCV 87.4  --  88.0  PLT 401*  --  096   Basic Metabolic Panel: Recent Labs  Lab 11/22/17 1751 11/22/17 1801 11/22/17 2203 11/23/17 0245 11/23/17 0724  NA 136 134* 139 139 139  K 5.2* 5.0 4.8 5.0 4.0  CL 101 104 107 110 111  CO2 13*  --  15* 19* 20*  GLUCOSE 497* 482* 412* 206* 139*  BUN 22* 19 22* 24* 24*  CREATININE 1.58* 1.20* 1.44* 1.57* 1.63*  CALCIUM 9.6  --  9.1 9.0 8.7*  MG  --   --  2.0  --   --    GFR: Estimated Creatinine Clearance: 38.5 mL/min (A) (by C-G formula based on SCr of 1.63 mg/dL (H)). Liver Function Tests: Recent Labs  Lab 11/22/17 2203  AST 22  ALT 28  ALKPHOS 93  BILITOT 1.5*  PROT 7.3  ALBUMIN 3.9   Recent Labs  Lab 11/23/17 0724  LIPASE 25   No results for input(s): AMMONIA in the last 168 hours. Coagulation Profile: No results for input(s): INR, PROTIME in the last 168 hours. Cardiac Enzymes: Recent Labs  Lab 11/22/17 2203  TROPONINI <0.03   BNP (last 3 results) No results for input(s): PROBNP in the last 8760 hours. HbA1C: No results for input(s): HGBA1C in the last 72  hours. CBG: Recent Labs  Lab 11/23/17 0340 11/23/17 0449 11/23/17 0550 11/23/17 0646 11/23/17 0809  GLUCAP 164* 139* 130* 127* 127*   Lipid Profile: No results for input(s): CHOL, HDL, LDLCALC, TRIG, CHOLHDL, LDLDIRECT in the last 72 hours. Thyroid Function Tests: No results for input(s): TSH, T4TOTAL, FREET4, T3FREE, THYROIDAB in the last 72 hours. Anemia Panel: No results for input(s): VITAMINB12, FOLATE, FERRITIN, TIBC, IRON, RETICCTPCT in the last 72 hours. Sepsis Labs: No results for input(s): PROCALCITON, LATICACIDVEN in the last 168 hours.  Recent Results (from the past 240 hour(s))  MRSA PCR Screening     Status: None   Collection Time: 11/22/17 11:43 PM  Result Value Ref Range Status   MRSA by PCR NEGATIVE NEGATIVE Final    Comment:        The GeneXpert  MRSA Assay (FDA approved for NASAL specimens only), is one component of a comprehensive MRSA colonization surveillance program. It is not intended to diagnose MRSA infection nor to guide or monitor treatment for MRSA infections. Performed at [facility], [place] [address] [place], [place] [address]          Radiology Studies: Dg Abd Acute W/chest  Result Date: 11/22/2017 CLINICAL DATA:  Acute abdominal pain with nausea and vomiting. EXAM: DG ABDOMEN ACUTE W/ 1V CHEST COMPARISON:  11/05/2017 chest radiograph and prior studies. FINDINGS: Gas in the nondistended stomach and only a small amount of gas in a few small bowel loops identified. No dilated bowel loops or pneumoperitoneum identified. No suspicious calcifications noted. The cardiomediastinal silhouette is unremarkable. The lungs are clear. No airspace disease, pleural effusion or pneumothorax. No acute bony abnormality identified. IMPRESSION: 1. No evidence of acute abnormality. Electronically Signed   By: Margarette Canada M.D.   On: 11/22/2017 20:50        Scheduled Meds: . clonazePAM  0.5 mg Oral BID  . enoxaparin (LOVENOX) injection   40 mg Subcutaneous QHS  . insulin aspart  0-24 Units Subcutaneous Q4H  . insulin detemir  12 Units Subcutaneous BID  . loperamide  4 mg Oral Once  . mirtazapine  15 mg Oral QHS  . pyridostigmine  30 mg Oral TID  . tamsulosin  0.4 mg Oral QPC supper   Continuous Infusions: . sodium chloride Stopped (11/23/17 0138)  . sodium chloride 75 mL/hr at 11/23/17 1035  . insulin (NOVOLIN-R) infusion 1.3 Units/hr (11/23/17 0647)     LOS: 1 day    Time spent: 40 minutes    Irine Seal, MD [facility] Pager [phone]  If 7PM-7AM, please contact night-coverage [email or web address] Password [email or web address] 11/23/2017, 11:09 AM

## 2017-11-23 NOTE — Progress Notes (Signed)
Pt's B/P checked several times=83/38, 87/39,84/39, HR-86,T-98.7,R-18 Paged oncall NP X.Blount. Waiting for NP to call back.  NP call back gave order to check B/P with manual . Checked b/p with manual cuff 104/58. Pt is not symptomatic. Continue to monitor.

## 2017-11-24 LAB — BASIC METABOLIC PANEL
Anion gap: 7 (ref 5–15)
BUN: 21 mg/dL — AB (ref 6–20)
CHLORIDE: 115 mmol/L — AB (ref 101–111)
CO2: 17 mmol/L — AB (ref 22–32)
CREATININE: 1.45 mg/dL — AB (ref 0.44–1.00)
Calcium: 8.3 mg/dL — ABNORMAL LOW (ref 8.9–10.3)
GFR calc non Af Amer: 41 mL/min — ABNORMAL LOW (ref >60)
GFR, EST AFRICAN AMERICAN: 48 mL/min — AB (ref >60)
Glucose, Bld: 70 mg/dL (ref 65–99)
POTASSIUM: 3.7 mmol/L (ref 3.5–5.1)
SODIUM: 139 mmol/L (ref 135–145)

## 2017-11-24 LAB — CBC
HCT: 29.2 % — ABNORMAL LOW (ref 36.0–46.0)
HEMOGLOBIN: 8.9 g/dL — AB (ref 12.0–15.0)
MCH: 26.3 pg (ref 26.0–34.0)
MCHC: 30.5 g/dL (ref 30.0–36.0)
MCV: 86.1 fL (ref 78.0–100.0)
Platelets: 242 10*3/uL (ref 150–400)
RBC: 3.39 MIL/uL — AB (ref 3.87–5.11)
RDW: 18.8 % — ABNORMAL HIGH (ref 11.5–15.5)
WBC: 7.6 10*3/uL (ref 4.0–10.5)

## 2017-11-24 LAB — URINALYSIS, ROUTINE W REFLEX MICROSCOPIC
Bilirubin Urine: NEGATIVE
Glucose, UA: NEGATIVE mg/dL
Hgb urine dipstick: NEGATIVE
Ketones, ur: 5 mg/dL — AB
LEUKOCYTES UA: NEGATIVE
Nitrite: NEGATIVE
PROTEIN: 30 mg/dL — AB
Specific Gravity, Urine: 1.024 (ref 1.005–1.030)
pH: 5 (ref 5.0–8.0)

## 2017-11-24 LAB — GLUCOSE, CAPILLARY
GLUCOSE-CAPILLARY: 107 mg/dL — AB (ref 65–99)
GLUCOSE-CAPILLARY: 134 mg/dL — AB (ref 65–99)
GLUCOSE-CAPILLARY: 165 mg/dL — AB (ref 65–99)
GLUCOSE-CAPILLARY: 169 mg/dL — AB (ref 65–99)
GLUCOSE-CAPILLARY: 276 mg/dL — AB (ref 65–99)
GLUCOSE-CAPILLARY: 59 mg/dL — AB (ref 65–99)
GLUCOSE-CAPILLARY: 95 mg/dL (ref 65–99)
Glucose-Capillary: 67 mg/dL (ref 65–99)

## 2017-11-24 MED ORDER — INSULIN ASPART 100 UNIT/ML ~~LOC~~ SOLN
0.0000 [IU] | Freq: Three times a day (TID) | SUBCUTANEOUS | Status: DC
  Administered 2017-11-24: 12 [IU] via SUBCUTANEOUS

## 2017-11-24 MED ORDER — INSULIN DETEMIR 100 UNIT/ML ~~LOC~~ SOLN
8.0000 [IU] | Freq: Two times a day (BID) | SUBCUTANEOUS | Status: DC
  Administered 2017-11-24 – 2017-11-25 (×3): 8 [IU] via SUBCUTANEOUS
  Filled 2017-11-24 (×5): qty 0.08

## 2017-11-24 MED ORDER — SODIUM BICARBONATE 650 MG PO TABS
650.0000 mg | ORAL_TABLET | Freq: Two times a day (BID) | ORAL | Status: DC
  Administered 2017-11-24 – 2017-11-25 (×3): 650 mg via ORAL
  Filled 2017-11-24 (×3): qty 1

## 2017-11-24 NOTE — Progress Notes (Signed)
Hypoglycemic Event  CBG: 67  Treatment: 15 GM carbohydrate snack  Symptoms: None  Follow-up CBG: Time:0530 CBG Result:97  Possible Reasons for Event: medication regimen  Comments/MD notified:    Rosie Fate

## 2017-11-24 NOTE — Progress Notes (Signed)
Hypoglycemic Event  CBG: 59  Treatment: 15 GM carbohydrate snack  Symptoms: None  Follow-up CBG: Time:0450 CBG Result: 67  Possible Reasons for Event: mediicine regimen  Comments/MD notified:    Rosie Fate

## 2017-11-24 NOTE — Progress Notes (Signed)
PROGRESS NOTE    Saleemah Mollenhauer  IHK:[id number] DOB: 01/21/1966 DOA: 11/22/2017 PCP: [name], MD    Brief Narrative:  [name] is a 52 y.o. female with history of diabetes mellitus type 1 recently discharged from hospital 10 days ago after being managed for DKA and gastroparesis presents to the ER with complaints of persistent nausea vomiting and diarrhea over the last 24 hours with elevated blood sugar.  Patient states he has been compliant with her insulin except that she did not take it today because of the persistent vomiting.  Also has been having diffuse crampy abdominal discomfort.  Denies any blood in the vomitus or diarrhea.  Denies any chest pain or shortness of breath.  ED Course: In the ER patient's blood sugar was around 497 with anion gap of 22 bicarb of 13 UA is pending.  Acute abdominal series unremarkable.  LFTs normal.  Patient started on IV insulin and fluids for DKA and admitted for further management along with patient having nausea vomiting diarrhea.     Assessment & Plan:   Principal Problem:   DKA, type 1 ([place]) Active Problems:   Nausea & vomiting   Diabetic gastroparesis ([place])   Essential hypertension   Severe protein-calorie malnutrition ([place])   DKA (diabetic ketoacidoses) ([place])   ARF (acute renal failure) ([place])  #1 diabetic ketoacidosis/type 1 diabetes Poorly controlled diabetes hemoglobin A1c of 10.6.  Likely precipitated by persistent nausea vomiting and diarrhea.  Patient states has been compliant with her medications except 1 to 2 days prior to admission when she was very nauseous vomiting and had diarrhea.  Chest x-ray done negative for any acute infiltrate.  Urinalysis is nitrite negative, leukocytes negative.  Patient noted to have 80 ketones in the urine.  Greater than 500 glucose in the urine.  Patient noted to have a anion gap of 22 on presentation.  Patient was placed on the glucose stabilizer, IV fluids and subsequently has been  transitioned to home regimen of Levemir 12 units twice daily.  CBG was 67 this morning.  Decrease Levemir to 8 units twice daily.  Continue sliding scale insulin.   2.  Diabetic gastroparesis Being followed at [facility].  Continue Mestinon for diabetic gastroparesis.  Outpatient follow-up.  3.  Acute renal failure Likely secondary to a prerenal azotemia secondary to problem #1.  Patient noted on admission to be hypotensive with systolics in the 75I.  Renal function improved with hydration.  Follow.  4.  Hypertension Blood pressure borderline.  Saline lock IV fluids.  Follow.    5.  Nausea/vomiting/diarrhea Could be secondary to gastroenteritis versus gastroparesis.  GI pathogen panel negative.  Improved clinically.  Continue home regimen Mestinon.  6.  Iron deficiency anemia/vitamin B12 deficiency Continue vitamin B12 injections monthly for the next few months with outpatient follow-up.  Start oral iron supplementation.    7.  History of urinary retention Continue Flomax.  Monitor.  8.  Dehydration Improved.  Saline lock IV fluids.    DVT prophylaxis: Lovenox Code Status: Full Family Communication: Updated patient.  No family present. Disposition Plan: If continued improvement hopefully home in 24 to 48 hours.     Consultants:   None  Procedures:   Chest x-ray 11/22/2017  Antimicrobials:   None   Subjective: Patient and up in the side of the bed.  Feels better.  No further nausea or emesis.  No further diarrhea.  Tolerating oral intake.   Objective: Vitals:   11/23/17  2151 11/23/17 2224 11/24/17 0529 11/24/17 0944  BP: (!) 112/56 (!) 104/58 (!) 102/48 (!) 108/57  Pulse: 93 86 90 94  Resp:  18 16 15   Temp:   98.3 F (36.8 C) 98.7 F (37.1 C)  TempSrc:   Oral Oral  SpO2:   99% 100%  Weight:      Height:        Intake/Output Summary (Last 24 hours) at 11/24/2017 1410 Last data filed at 11/24/2017 1230 Gross per 24 hour    Intake 1970 ml  Output 900 ml  Net 1070 ml   Filed Weights   11/22/17 2322  Weight: 68.8 kg (151 lb 10.8 oz)    Examination:  General exam:NAD Respiratory system: Lungs clear to auscultation bilaterally.  No wheezes, no crackles, rhonchi.  Normal respiratory effort.   Cardiovascular system: The rate and rhythm no murmurs rubs or gallops.  No JVD.  No lower extremity edema. Gastrointestinal system: Abdomen is, nontender, nondistended, positive bowel sounds.  No rebound.  No guarding.  Central nervous system: Alert and oriented. No focal neurological deficits. Extremities: Symmetric 5 x 5 power. Skin: No rashes, lesions or ulcers Psychiatry: Judgement and insight appear normal. Mood & affect appropriate.     Data Reviewed: I have personally reviewed following labs and imaging studies  CBC: Recent Labs  Lab 11/22/17 1751 11/22/17 1801 11/22/17 2203 11/24/17 0407  WBC 13.0*  --  15.5* 7.6  NEUTROABS 11.8*  --   --   --   HGB 11.6* 13.6 11.0* 8.9*  HCT 38.2 40.0 36.7 29.2*  MCV 87.4  --  88.0 86.1  PLT 401*  --  332 621   Basic Metabolic Panel: Recent Labs  Lab 11/22/17 2203 11/23/17 0245 11/23/17 0724 11/23/17 1118 11/24/17 0407  NA 139 139 139 141 139  K 4.8 5.0 4.0 4.3 3.7  CL 107 110 111 112* 115*  CO2 15* 19* 20* 19* 17*  GLUCOSE 412* 206* 139* 175* 70  BUN 22* 24* 24* 24* 21*  CREATININE 1.44* 1.57* 1.63* 1.51* 1.45*  CALCIUM 9.1 9.0 8.7* 8.8* 8.3*  MG 2.0  --   --   --   --    GFR: Estimated Creatinine Clearance: 43.2 mL/min (A) (by C-G formula based on SCr of 1.45 mg/dL (H)). Liver Function Tests: Recent Labs  Lab 11/22/17 2203  AST 22  ALT 28  ALKPHOS 93  BILITOT 1.5*  PROT 7.3  ALBUMIN 3.9   Recent Labs  Lab 11/23/17 0724  LIPASE 25   No results for input(s): AMMONIA in the last 168 hours. Coagulation Profile: No results for input(s): INR, PROTIME in the last 168 hours. Cardiac Enzymes: Recent Labs  Lab 11/22/17 2203  TROPONINI  <0.03   BNP (last 3 results) No results for input(s): PROBNP in the last 8760 hours. HbA1C: No results for input(s): HGBA1C in the last 72 hours. CBG: Recent Labs  Lab 11/24/17 0427 11/24/17 0452 11/24/17 0527 11/24/17 0820 11/24/17 1227  GLUCAP 59* 67 95 169* 165*   Lipid Profile: No results for input(s): CHOL, HDL, LDLCALC, TRIG, CHOLHDL, LDLDIRECT in the last 72 hours. Thyroid Function Tests: No results for input(s): TSH, T4TOTAL, FREET4, T3FREE, THYROIDAB in the last 72 hours. Anemia Panel: No results for input(s): VITAMINB12, FOLATE, FERRITIN, TIBC, IRON, RETICCTPCT in the last 72 hours. Sepsis Labs: No results for input(s): PROCALCITON, LATICACIDVEN in the last 168 hours.  Recent Results (from the past 240 hour(s))  MRSA PCR Screening  Status: None   Collection Time: 11/22/17 11:43 PM  Result Value Ref Range Status   MRSA by PCR NEGATIVE NEGATIVE Final    Comment:        The GeneXpert MRSA Assay (FDA approved for NASAL specimens only), is one component of a comprehensive MRSA colonization surveillance program. It is not intended to diagnose MRSA infection nor to guide or monitor treatment for MRSA infections. Performed at [facility], [place] [address] [place], [place] [address]   Gastrointestinal Panel by PCR , Stool     Status: None   Collection Time: 11/23/17  6:28 AM  Result Value Ref Range Status   Campylobacter species NOT DETECTED NOT DETECTED Final   Plesimonas shigelloides NOT DETECTED NOT DETECTED Final   Salmonella species NOT DETECTED NOT DETECTED Final   Yersinia enterocolitica NOT DETECTED NOT DETECTED Final   Vibrio species NOT DETECTED NOT DETECTED Final   Vibrio cholerae NOT DETECTED NOT DETECTED Final   Enteroaggregative E coli (EAEC) NOT DETECTED NOT DETECTED Final   Enteropathogenic E coli (EPEC) NOT DETECTED NOT DETECTED Final   Enterotoxigenic E coli (ETEC) NOT DETECTED NOT DETECTED Final   Shiga like toxin  producing E coli (STEC) NOT DETECTED NOT DETECTED Final   Shigella/Enteroinvasive E coli (EIEC) NOT DETECTED NOT DETECTED Final   Cryptosporidium NOT DETECTED NOT DETECTED Final   Cyclospora cayetanensis NOT DETECTED NOT DETECTED Final   Entamoeba histolytica NOT DETECTED NOT DETECTED Final   Giardia lamblia NOT DETECTED NOT DETECTED Final   Adenovirus F40/41 NOT DETECTED NOT DETECTED Final   Astrovirus NOT DETECTED NOT DETECTED Final   Norovirus GI/GII NOT DETECTED NOT DETECTED Final   Rotavirus A NOT DETECTED NOT DETECTED Final   Sapovirus (I, II, IV, and V) NOT DETECTED NOT DETECTED Final    Comment: Performed at [facility], [address] [place],  [address]         Radiology Studies: Dg Abd Acute W/chest  Result Date: 11/22/2017 CLINICAL DATA:  Acute abdominal pain with nausea and vomiting. EXAM: DG ABDOMEN ACUTE W/ 1V CHEST COMPARISON:  11/05/2017 chest radiograph and prior studies. FINDINGS: Gas in the nondistended stomach and only a small amount of gas in a few small bowel loops identified. No dilated bowel loops or pneumoperitoneum identified. No suspicious calcifications noted. The cardiomediastinal silhouette is unremarkable. The lungs are clear. No airspace disease, pleural effusion or pneumothorax. No acute bony abnormality identified. IMPRESSION: 1. No evidence of acute abnormality. Electronically Signed   By: Margarette Canada M.D.   On: 11/22/2017 20:50        Scheduled Meds: . clonazePAM  0.5 mg Oral BID  . enoxaparin (LOVENOX) injection  40 mg Subcutaneous QHS  . insulin aspart  0-24 Units Subcutaneous Q4H  . insulin detemir  8 Units Subcutaneous BID  . mirtazapine  15 mg Oral QHS  . pyridostigmine  30 mg Oral TID  . sodium bicarbonate  650 mg Oral BID  . tamsulosin  0.4 mg Oral QPC supper   Continuous Infusions: . sodium chloride 10 mL/hr at 11/24/17 1246     LOS: 2 days    Time spent: 40 minutes    Irine Seal, MD [facility] Pager [phone]  If 7PM-7AM, please contact night-coverage [email or web address] Password [email or web address] 11/24/2017, 2:10 PM

## 2017-11-25 DIAGNOSIS — G43A0 Cyclical vomiting, not intractable: Secondary | ICD-10-CM

## 2017-11-25 LAB — BASIC METABOLIC PANEL
ANION GAP: 7 (ref 5–15)
BUN: 16 mg/dL (ref 6–20)
CALCIUM: 8.5 mg/dL — AB (ref 8.9–10.3)
CHLORIDE: 112 mmol/L — AB (ref 101–111)
CO2: 21 mmol/L — AB (ref 22–32)
Creatinine, Ser: 0.99 mg/dL (ref 0.44–1.00)
GFR calc non Af Amer: >60 mL/min (ref >60)
Glucose, Bld: 113 mg/dL — ABNORMAL HIGH (ref 65–99)
Potassium: 3.8 mmol/L (ref 3.5–5.1)
Sodium: 140 mmol/L (ref 135–145)

## 2017-11-25 LAB — GLUCOSE, CAPILLARY
GLUCOSE-CAPILLARY: 376 mg/dL — AB (ref 65–99)
GLUCOSE-CAPILLARY: 27 mg/dL — AB (ref 65–99)
GLUCOSE-CAPILLARY: 104 mg/dL — AB (ref 65–99)
GLUCOSE-CAPILLARY: 298 mg/dL — AB (ref 65–99)
GLUCOSE-CAPILLARY: 156 mg/dL — AB (ref 65–99)
Glucose-Capillary: 159 mg/dL — ABNORMAL HIGH (ref 65–99)

## 2017-11-25 LAB — CBC
HEMATOCRIT: 32.1 % — AB (ref 36.0–46.0)
HEMOGLOBIN: 10 g/dL — AB (ref 12.0–15.0)
MCH: 26.6 pg (ref 26.0–34.0)
MCHC: 31.2 g/dL (ref 30.0–36.0)
MCV: 85.4 fL (ref 78.0–100.0)
Platelets: 281 10*3/uL (ref 150–400)
RBC: 3.76 MIL/uL — AB (ref 3.87–5.11)
RDW: 18.6 % — ABNORMAL HIGH (ref 11.5–15.5)
WBC: 4.3 10*3/uL (ref 4.0–10.5)

## 2017-11-25 MED ORDER — DEXTROSE 50 % IV SOLN
INTRAVENOUS | Status: AC
  Administered 2017-11-25: 50 mL
  Filled 2017-11-25: qty 50

## 2017-11-25 MED ORDER — INSULIN ASPART 100 UNIT/ML ~~LOC~~ SOLN
0.0000 [IU] | SUBCUTANEOUS | Status: DC
  Administered 2017-11-25: 2 [IU] via SUBCUTANEOUS
  Administered 2017-11-25: 12 [IU] via SUBCUTANEOUS

## 2017-11-25 MED ORDER — INSULIN DETEMIR 100 UNIT/ML ~~LOC~~ SOLN: 15.0000 [IU] | SUBCUTANEOUS | 0 refills | Status: DC

## 2017-11-25 NOTE — Discharge Summary (Signed)
Physician Discharge Summary  Afia Messenger ELF:[id number] DOB: 1965/12/06 DOA: 11/22/2017  PCP: [name], MD  Admit date: 11/22/2017 Discharge date: 11/25/2017  Time spent: 50 minutes  Recommendations for Outpatient Follow-up:  1. Follow-up with [name], MD as scheduled on 11/28/2017.  Patient's Levemir dose has been changed to 15 units every morning.  Patient is to log his CBGs over the next 3 days which she is supposed to bring to her hospital follow-up for further management of her diabetes.  On follow-up patient will need a basic metabolic profile done to follow-up on electrolytes and renal function.  Patient's vitamin B12 levels will need to be followed up upon.   Discharge Diagnoses:  Principal Problem:   DKA, type 1 ([place]) Active Problems:   Nausea & vomiting   Diabetic gastroparesis ([place])   Essential hypertension   Severe protein-calorie malnutrition ([place])   DKA (diabetic ketoacidoses) ([place])   ARF (acute renal failure) ([place])   Discharge Condition: Stable and improved  Diet recommendation: Carb modified.  Filed Weights   11/22/17 2322  Weight: 68.8 kg (151 lb 10.8 oz)    History of present illness:  Per Dr. Wyatt Portela is a 52 y.o. female with history of diabetes mellitus type 1 recently discharged from hospital 10 days ago after being managed for DKA and gastroparesis presented to the ER with complaints of persistent nausea vomiting and diarrhea over the last 24 hours with elevated blood sugar.  Patient stated she had been compliant with her insulin except that she did not take it today because of the persistent vomiting.  Also had been having diffuse crampy abdominal discomfort.  Denied any blood in the vomitus or diarrhea.  Denied any chest pain or shortness of breath.  ED Course: In the ER patient's blood sugar was around 497 with anion gap of 22 bicarb of 13 UA is pending.  Acute abdominal series unremarkable.  LFTs normal.  Patient started on  IV insulin and fluids for DKA and admitted for further management along with patient having nausea vomiting diarrhea.    Hospital Course:  1 diabetic ketoacidosis/type 1 diabetes Poorly controlled diabetes hemoglobin A1c of 10.6.  Likely precipitated by persistent nausea vomiting and diarrhea.  Patient stated had been compliant with her medications except 1 to 2 days prior to admission when she was very nauseous vomiting and had diarrhea.  Chest x-ray done negative for any acute infiltrate.  Urinalysis was nitrite negative, leukocytes negative.  Patient noted to have 80 ketones in the urine.  Greater than 500 glucose in the urine.  Patient noted to have a anion gap of 22 on presentation.  Patient was placed on the glucose stabilizer, IV fluids and subsequently has been transitioned to home regimen of Levemir 12 units twice daily.  Patient's diet was advanced which he tolerated.  Patient improved clinically.  It was noted that patient had a low blood glucose level of 27 in the early hours of 11/25/2017.  Patient was noted to have some new coverage insulin and was on a strict diabetic diet.  Patient also did state that at home she always ate a snack around bedtime which she has not been able to receive during the hospitalization.  Patient also stated that she was taking her Levemir only once daily and not twice daily as she was previously discharged home.  Patient's blood glucose levels improved and CBGs improved for the rest of the day.  Patient's blood glucose levels went up to 298.  Patient  will be discharged home on Levemir 15 units every morning and is to continue on her sliding scale insulin on discharge.  Patient has been advised to record her blood glucose levels 4 times daily which she is supposed to log and to follow-up results with PCP this Thursday, 11/28/2017.  Patient was also instructed to check her blood glucose levels when she and to drink orange juice and a snack.  Has symptoms of hypoglycemia  patient's blood glucose levels remain stable during the hospitalization and patient be discharged in stable and improved condition.  Outpatient follow-up with PCP as scheduled on 11/28/2017.  2.  Diabetic gastroparesis Being followed at [facility].  Continued on Mestinon for diabetic gastroparesis.  Outpatient follow-up.  3.  Acute renal failure Likely secondary to a prerenal azotemia secondary to problem #1.  Patient noted on admission to be hypotensive with systolics in the 86V.  Patient hydrated with IV fluids and followed.  Renal function improved daily.  IV fluids with saline lock.  Acute renal failure had resolved by day of discharge.  4.  Hypertension Blood pressure was initially borderline during the hospitalization.  Patient hydrated IV fluids with improvement with blood pressure.  Outpatient follow-up.    5.  Nausea/vomiting/diarrhea Could be secondary to gastroenteritis versus gastroparesis.  GI pathogen panel negative.  Patient was initially placed on bowel rest, placed on IV fluids.  Patient improved with resolution of DKA.  Patient had no further nausea vomiting or diarrhea.  Patient was maintained on home regimen of Mestinon.  Outpatient follow-u  6.  Iron deficiency anemia/vitamin B12 deficiency Patient was maintained on vitamin B12 injections during the hospitalization was discharged on vitamin B12 injections monthly.  Outpatient follow-up.   7.  History of urinary retention Patient maintained on home regimen of Flomax.  Outpatient follow-up.   8.  Dehydration Resolved with hydration.       Procedures:  Chest x-ray 11/22/2017   Consultations:  None  Discharge Exam: Vitals:   11/25/17 1005 11/25/17 1357  BP: (!) 145/75 (!) 157/71  Pulse: 83 82  Resp: 16 18  Temp: 98.8 F (37.1 C) 98 F (36.7 C)  SpO2: 99% 100%    General: NAD Cardiovascular: RRR Respiratory: CTAB  Discharge Instructions   Discharge  Instructions    Diet Carb Modified   Complete by:  As directed    Discharge instructions   Complete by:  As directed    Take Levemir 15 units every morning.  Continue home regimen of NovoLog sliding scale 3 times daily with meals. Check blood glucose levels 4 times daily before meals and at bedtime and record in a log and take to PCPs office for further management of your diabetes this week.  If you have any symptoms of hypoglycemia check your blood glucose levels, eat a snack, drink orange juice and repeat your blood glucose levels.   Increase activity slowly   Complete by:  As directed      Allergies as of 11/25/2017      Reactions   Anesthetics, Amide Nausea And Vomiting   Penicillins Diarrhea, Nausea And Vomiting, Other (See Comments)   Has patient had a PCN reaction causing immediate rash, facial/tongue/throat swelling, SOB or lightheadedness with hypotension: Yes Has patient had a PCN reaction causing severe rash involving mucus membranes or skin necrosis: No Has patient had a PCN reaction that required hospitalization No Has patient had a PCN reaction occurring within the last 10 years: Yes  If  all of the above answers are "NO", then may proceed with Cephalosporin use.   Buprenorphine Hcl Rash   Encainide Nausea And Vomiting   Metoclopramide Other (See Comments)   Dystonia, muscle rigidity.       Medication List    TAKE these medications   busPIRone 10 MG tablet Commonly known as:  BUSPAR Take 1 tablet (10 mg total) by mouth 3 (three) times daily.   clonazePAM 0.5 MG disintegrating tablet Commonly known as:  KLONOPIN Take 1 tablet (0.5 mg total) by mouth 2 (two) times daily.   cyanocobalamin 1000 MCG/ML injection Commonly known as:  (VITAMIN B-12) Inject 1 mL (1,000 mcg total) into the muscle daily. 1000 MCG's IM daily x3 days, then 1000 MCG's IM weekly x1 month, then 1000 MCG's IM monthly.   fluticasone 50 MCG/ACT nasal spray Commonly known as:  FLONASE Place 2 sprays  into both nostrils daily.   furosemide 20 MG tablet Commonly known as:  LASIX Take 20 mg by mouth daily as needed for fluid or edema.   insulin aspart 100 UNIT/ML injection Commonly known as:  novoLOG Inject 0-12 Units into the skin 4 (four) times daily -  before meals and at bedtime. Uses a sliding scale: 70-119 = 0 units, 120-150 = 2 units, 151-180 =  4 units, 181-210 = 5 units, 211-240 = 6 units, 241-270 = 8 units, 271-300 = 9 units, 301-330 = 10 units, 331-360 = 11 units, 361-400 = 12 units, greater than 400 notify MD.   insulin detemir 100 UNIT/ML injection Commonly known as:  LEVEMIR Inject 0.15 mLs (15 Units total) into the skin every morning. What changed:    how much to take  when to take this   Insulin Pen Needle 31G X 5 MM Misc Commonly known as:  B-D UF III MINI PEN NEEDLES Use to inject insulin twice daily.   loratadine 10 MG tablet Commonly known as:  CLARITIN Take 1 tablet (10 mg total) by mouth daily.   mirtazapine 15 MG tablet Commonly known as:  REMERON Take 1 tablet (15 mg total) by mouth at bedtime.   ondansetron 8 MG disintegrating tablet Commonly known as:  ZOFRAN-ODT Take 1 tablet (8 mg total) by mouth every 8 (eight) hours as needed for nausea or vomiting.   pantoprazole 40 MG tablet Commonly known as:  PROTONIX Take 1 tablet (40 mg total) by mouth daily.   polyethylene glycol packet Commonly known as:  MIRALAX / GLYCOLAX Take 17 g by mouth daily as needed for mild constipation.   pregabalin 150 MG capsule Commonly known as:  LYRICA Take 1 capsule (150 mg total) by mouth daily.   pyridostigmine 60 MG tablet Commonly known as:  MESTINON Take 0.5 tablets (30 mg total) by mouth 3 (three) times daily. Take 1/2 with meals   SYRINGE 3CC/22GX1-1/2" 22G X 1-1/2" 3 ML Misc Inject 1,000 mcg into the muscle daily.   tamsulosin 0.4 MG Caps capsule Commonly known as:  FLOMAX Take 1 capsule (0.4 mg total) by mouth daily after supper.       Allergies  Allergen Reactions  . Anesthetics, Amide Nausea And Vomiting  . Penicillins Diarrhea, Nausea And Vomiting and Other (See Comments)    Has patient had a PCN reaction causing immediate rash, facial/tongue/throat swelling, SOB or lightheadedness with hypotension: Yes Has patient had a PCN reaction causing severe rash involving mucus membranes or skin necrosis: No Has patient had a PCN reaction that required hospitalization No Has patient had a  PCN reaction occurring within the last 10 years: Yes  If all of the above answers are "NO", then may proceed with Cephalosporin use.   . Buprenorphine Hcl Rash  . Encainide Nausea And Vomiting  . Metoclopramide Other (See Comments)    Dystonia, muscle rigidity.    Follow-up Information    [name], MD Follow up.   Specialty:  Family Medicine Why:  Follow-up as scheduled. Contact information: [place] [address] [phone]            The results of significant diagnostics from this hospitalization (including imaging, microbiology, ancillary and laboratory) are listed below for reference.    Significant Diagnostic Studies: US Renal  Result Date: 11/06/2017 CLINICAL DATA:  Acute renal failure EXAM: RENAL / URINARY TRACT ULTRASOUND COMPLETE COMPARISON:  None. FINDINGS: Right Kidney: Length: 9.1 cm.  Increased echotexture.  No mass or hydronephrosis. Left Kidney: Length: 9.9 cm.  Increased echotexture.  No mass or hydronephrosis. Bladder: Appears normal for degree of bladder distention. IMPRESSION: Small, echogenic kidneys bilaterally compatible with chronic medical renal disease. No hydronephrosis. Electronically Signed   By: Rolm Baptise M.D.   On: 11/06/2017 10:09   Dg Chest Port 1 View  Result Date: 11/05/2017 CLINICAL DATA:  Nausea and vomiting EXAM: PORTABLE CHEST 1 VIEW COMPARISON:  06/15/2017 FINDINGS: Left upper extremity catheter tip overlies the distal SVC. No focal opacity or pleural  effusion. Normal heart size. No pneumothorax. IMPRESSION: No active disease. Left upper extremity catheter tip overlies the SVC Electronically Signed   By: Donavan Foil M.D.   On: 11/05/2017 20:03   Dg Abd Acute W/chest  Result Date: 11/22/2017 CLINICAL DATA:  Acute abdominal pain with nausea and vomiting. EXAM: DG ABDOMEN ACUTE W/ 1V CHEST COMPARISON:  11/05/2017 chest radiograph and prior studies. FINDINGS: Gas in the nondistended stomach and only a small amount of gas in a few small bowel loops identified. No dilated bowel loops or pneumoperitoneum identified. No suspicious calcifications noted. The cardiomediastinal silhouette is unremarkable. The lungs are clear. No airspace disease, pleural effusion or pneumothorax. No acute bony abnormality identified. IMPRESSION: 1. No evidence of acute abnormality. Electronically Signed   By: Margarette Canada M.D.   On: 11/22/2017 20:50   Irpicc Placement Left <5 Yrsinc Img Guide  Result Date: 11/05/2017 INDICATION: 52 year old with dehydration and hyperglycemia.  Poor venous access. EXAM: LEFT ARM PICC LINE PLACEMENT WITH ULTRASOUND AND FLUOROSCOPIC GUIDANCE MEDICATIONS: None ANESTHESIA/SEDATION: None FLUOROSCOPY TIME:  Fluoroscopy Time: 12 seconds, 0.6 mGy COMPLICATIONS: None immediate. PROCEDURE: The patient was advised of the possible risks and complications and agreed to undergo the procedure. The patient was then brought to the angiographic suite for the procedure. The left arm was prepped with chlorhexidine, draped in the usual sterile fashion using maximum barrier technique (cap and mask, sterile gown, sterile gloves, large sterile sheet, hand hygiene and cutaneous antisepsis) and infiltrated locally with 1% Lidocaine. Ultrasound demonstrated patency of the left brachial vein, and this was documented with an image. Under real-time ultrasound guidance, this vein was accessed with a 21 gauge micropuncture needle and image documentation was performed. A 0.018  wire was introduced in to the vein. Over this, a 5 [place] dual lumen power injectable PICC was advanced to the lower SVC/right atrial junction. Fluoroscopy during the procedure and fluoro spot radiograph confirms appropriate catheter position. The catheter was flushed and covered with a sterile dressing. Catheter length: 37 cm IMPRESSION: Successful left arm Power PICC line placement with ultrasound and fluoroscopic  guidance. The catheter is ready for use. Electronically Signed   By: Markus Daft M.D.   On: 11/05/2017 17:50   [place] Ekg Site Rite  Result Date: 11/05/2017 If Site Rite image not attached, placement could not be confirmed due to current cardiac rhythm.   Microbiology: Recent Results (from the past 240 hour(s))  MRSA PCR Screening     Status: None   Collection Time: 11/22/17 11:43 PM  Result Value Ref Range Status   MRSA by PCR NEGATIVE NEGATIVE Final    Comment:        The GeneXpert MRSA Assay (FDA approved for NASAL specimens only), is one component of a comprehensive MRSA colonization surveillance program. It is not intended to diagnose MRSA infection nor to guide or monitor treatment for MRSA infections. Performed at [facility], [place] [address] [place], [place] [address]   Gastrointestinal Panel by PCR , Stool     Status: None   Collection Time: 11/23/17  6:28 AM  Result Value Ref Range Status   Campylobacter species NOT DETECTED NOT DETECTED Final   Plesimonas shigelloides NOT DETECTED NOT DETECTED Final   Salmonella species NOT DETECTED NOT DETECTED Final   Yersinia enterocolitica NOT DETECTED NOT DETECTED Final   Vibrio species NOT DETECTED NOT DETECTED Final   Vibrio cholerae NOT DETECTED NOT DETECTED Final   Enteroaggregative E coli (EAEC) NOT DETECTED NOT DETECTED Final   Enteropathogenic E coli (EPEC) NOT DETECTED NOT DETECTED Final   Enterotoxigenic E coli (ETEC) NOT DETECTED NOT DETECTED Final   Shiga like toxin producing E coli (STEC) NOT  DETECTED NOT DETECTED Final   Shigella/Enteroinvasive E coli (EIEC) NOT DETECTED NOT DETECTED Final   Cryptosporidium NOT DETECTED NOT DETECTED Final   Cyclospora cayetanensis NOT DETECTED NOT DETECTED Final   Entamoeba histolytica NOT DETECTED NOT DETECTED Final   Giardia lamblia NOT DETECTED NOT DETECTED Final   Adenovirus F40/41 NOT DETECTED NOT DETECTED Final   Astrovirus NOT DETECTED NOT DETECTED Final   Norovirus GI/GII NOT DETECTED NOT DETECTED Final   Rotavirus A NOT DETECTED NOT DETECTED Final   Sapovirus (I, II, IV, and V) NOT DETECTED NOT DETECTED Final    Comment: Performed at [facility], [address] [place], [place] [address]  Culture, Urine     Status: Abnormal (Preliminary result)   Collection Time: 11/23/17  5:35 PM  Result Value Ref Range Status   Specimen Description   Final    URINE, RANDOM Performed at [facility], [place] [address] [place], [place] [address]    Special Requests   Final    NONE Performed at [facility], [place] [address], [place], [place] [address]    Culture >=100,000 COLONIES/mL ESCHERICHIA COLI (A)  Final   Report Status PENDING  Incomplete     Labs: Basic Metabolic Panel: Recent Labs  Lab 11/22/17 2203 11/23/17 0245 11/23/17 0724 11/23/17 1118 11/24/17 0407 11/25/17 0424  NA 139 139 139 141 139 140  K 4.8 5.0 4.0 4.3 3.7 3.8  CL 107 110 111 112* 115* 112*  CO2 15* 19* 20* 19* 17* 21*  GLUCOSE 412* 206* 139* 175* 70 113*  BUN 22* 24* 24* 24* 21* 16  CREATININE 1.44* 1.57* 1.63* 1.51* 1.45* 0.99  CALCIUM 9.1 9.0 8.7* 8.8* 8.3* 8.5*  MG 2.0  --   --   --   --   --    Liver Function Tests: Recent Labs  Lab 11/22/17 2203  AST 22  ALT 28  ALKPHOS 93  BILITOT 1.5*  PROT 7.3  ALBUMIN 3.9   Recent Labs  Lab 11/23/17 0724  LIPASE 25   No results for input(s): AMMONIA in the last 168 hours. CBC: Recent Labs  Lab 11/22/17 1751 11/22/17 1801 11/22/17 2203 11/24/17 0407  11/25/17 0424  WBC 13.0*  --  15.5* 7.6 4.3  NEUTROABS 11.8*  --   --   --   --   HGB 11.6* 13.6 11.0* 8.9* 10.0*  HCT 38.2 40.0 36.7 29.2* 32.1*  MCV 87.4  --  88.0 86.1 85.4  PLT 401*  --  332 242 281   Cardiac Enzymes: Recent Labs  Lab 11/22/17 2203  TROPONINI <0.03   BNP: BNP (last 3 results) No results for input(s): BNP in the last 8760 hours.  ProBNP (last 3 results) No results for input(s): PROBNP in the last 8760 hours.  CBG: Recent Labs  Lab 11/25/17 0136 11/25/17 0159 11/25/17 0648 11/25/17 1135 11/25/17 1554  GLUCAP 27* 159* 104* 298* 156*       Signed:  Irine Seal MD.  [facility] 11/25/2017, 5:44 PM

## 2017-11-25 NOTE — Progress Notes (Signed)
Patient C/O light headache, CBG was done with the result of 27 at 0136, patient was alert, oriented X4, no visual signs of acute distress noted, VS WNL, Dextrose 50 was given per protocol, CBG rechecked with result of 159, patient is given crackers and peanut butter. Will continue to monitor.

## 2017-11-25 NOTE — Progress Notes (Signed)
CSW received a call from pt's RN stating pt was requesting a taxi voucher due to the pt being unable to find a ride.  Per RN, pt can ambulate but RN states provider is stating pt should not ride a bus but did not specify why.  CSW relayed policy that pt's who can ambulate cannot be provided a taxi voucher without necessary medical reason such as inability to ambulate.  CSW stated pt's contact sheet has a sister and son with [employer] telephone number and offered to call on pt's behalf should pt desire.  CSW standing by should social work need need or need for assistance arise.  CSW will continue to follow for D/C needs.  Alphonse Guild. Araceli Arango, LCSW, LCAS, CSI Clinical Social Worker Ph: [phone]

## 2017-11-26 ENCOUNTER — Other Ambulatory Visit: Payer: Self-pay | Admitting: Family Medicine

## 2017-11-26 LAB — URINE CULTURE

## 2017-11-26 MED ORDER — NITROFURANTOIN MONOHYD MACRO 100 MG PO CAPS: 100.0000 mg | ORAL_CAPSULE | Freq: Two times a day (BID) | ORAL | 0 refills | Status: DC

## 2017-11-27 ENCOUNTER — Telehealth: Payer: Self-pay

## 2017-11-27 NOTE — Telephone Encounter (Signed)
[facility] Post-discharge Follow-Up Phone Call:  Date of Discharge: 11/25/2017 Principal Discharge Diagnosis(es):  DKA - type 1, gastroparesis Post-discharge Communication: (Clearly document all attempts clearly and date contact made) call received from the patient Call Completed: Yes                    With Whom: patient Interpreter Needed: no     Please check all that apply:  X  Patient is knowledgeable of his/her condition(s) and/or treatment. X  Patient is caring for self at home.  - she is currently living with her sister but her sister works all day.  The patient is independent with her care needs.  She said that she has no experienced any nausea/vomiting since being home. ? Patient is receiving assist at home from family and/or caregiver. Family and/or caregiver is knowledgeable of patient's condition(s) and/or treatment. ? Patient is receiving home health services. If so, name of agency.    Medication Reconciliation:  X  Medication list reviewed with patient. - reviewed briefly. Change with levemir discussed. The patient will reconcile all medications at her clinic appointment tomorrow. She did not report any questions about her medication regime.  X  Patient obtained all discharge medications. If not, why? - she said that she has all medications, except vitamin B12 injections that were ordered at her prior hospital discharge.  She said she is taking medications as ordered. She noted that she needs refills for almost all medications and will plan to get them tomorrow. She is using [facility] pharmacy. She reported that she has the [employer] for the pharmacy. She has a glucometer and has been checking her blood sugars as instructed. Her blood sugar this morning was 168.     Activities of Daily Living:  X  Independent ? Needs assist (describe; ? home DME used) ? [facility] (describe, ? home DME used)   Community resources in place for patient:  X  None  ? Home  Health/Home DME ? Assisted Living ? Support Group                 Questions/Concerns discussed: no questions/concerns reported. She confirmed her appointment for tomorrow at [facility] - 11/28/17 @ 1130. She stated that she needs transportation. Informed her that cab can be arranged. She will complete a SCAT application when in the clinic tomorrow.

## 2017-11-27 NOTE — Telephone Encounter (Signed)
Patient was called and informed to contact office for lab results.   If patient returns phone call please inform patient that urine culture shows E.coli and UTI Dr. Margarita Rana sent in a prescription .

## 2017-11-28 ENCOUNTER — Ambulatory Visit: Payer: Medicare Other | Attending: Family Medicine | Admitting: Family Medicine

## 2017-11-28 ENCOUNTER — Encounter: Payer: Self-pay | Admitting: Family Medicine

## 2017-11-28 ENCOUNTER — Telehealth: Payer: Self-pay

## 2017-11-28 VITALS — BP 122/70 | HR 82 | Temp 97.5°F | Ht 62.0 in | Wt 148.0 lb

## 2017-11-28 DIAGNOSIS — F329 Major depressive disorder, single episode, unspecified: Secondary | ICD-10-CM

## 2017-11-28 DIAGNOSIS — E538 Deficiency of other specified B group vitamins: Secondary | ICD-10-CM | POA: Insufficient documentation

## 2017-11-28 DIAGNOSIS — E10649 Type 1 diabetes mellitus with hypoglycemia without coma: Secondary | ICD-10-CM | POA: Insufficient documentation

## 2017-11-28 DIAGNOSIS — K219 Gastro-esophageal reflux disease without esophagitis: Secondary | ICD-10-CM | POA: Diagnosis not present

## 2017-11-28 DIAGNOSIS — R5381 Other malaise: Secondary | ICD-10-CM | POA: Diagnosis not present

## 2017-11-28 DIAGNOSIS — K3184 Gastroparesis: Secondary | ICD-10-CM | POA: Diagnosis not present

## 2017-11-28 DIAGNOSIS — I10 Essential (primary) hypertension: Secondary | ICD-10-CM | POA: Diagnosis not present

## 2017-11-28 DIAGNOSIS — Z7951 Long term (current) use of inhaled steroids: Secondary | ICD-10-CM | POA: Diagnosis not present

## 2017-11-28 DIAGNOSIS — E785 Hyperlipidemia, unspecified: Secondary | ICD-10-CM | POA: Diagnosis not present

## 2017-11-28 DIAGNOSIS — E1040 Type 1 diabetes mellitus with diabetic neuropathy, unspecified: Secondary | ICD-10-CM | POA: Diagnosis not present

## 2017-11-28 DIAGNOSIS — F419 Anxiety disorder, unspecified: Secondary | ICD-10-CM

## 2017-11-28 DIAGNOSIS — F32A Depression, unspecified: Secondary | ICD-10-CM

## 2017-11-28 DIAGNOSIS — D649 Anemia, unspecified: Secondary | ICD-10-CM | POA: Insufficient documentation

## 2017-11-28 DIAGNOSIS — R600 Localized edema: Secondary | ICD-10-CM | POA: Diagnosis not present

## 2017-11-28 DIAGNOSIS — E1043 Type 1 diabetes mellitus with diabetic autonomic (poly)neuropathy: Secondary | ICD-10-CM | POA: Diagnosis not present

## 2017-11-28 DIAGNOSIS — N3000 Acute cystitis without hematuria: Secondary | ICD-10-CM | POA: Diagnosis not present

## 2017-11-28 DIAGNOSIS — Z794 Long term (current) use of insulin: Secondary | ICD-10-CM | POA: Insufficient documentation

## 2017-11-28 DIAGNOSIS — E1143 Type 2 diabetes mellitus with diabetic autonomic (poly)neuropathy: Secondary | ICD-10-CM

## 2017-11-28 DIAGNOSIS — Z79899 Other long term (current) drug therapy: Secondary | ICD-10-CM | POA: Insufficient documentation

## 2017-11-28 LAB — GLUCOSE, POCT (MANUAL RESULT ENTRY): POC GLUCOSE: 72 mg/dL (ref 70–99)

## 2017-11-28 MED ORDER — FUROSEMIDE 20 MG PO TABS: 20.0000 mg | ORAL_TABLET | Freq: Every day | ORAL | 9 refills | Status: DC | PRN

## 2017-11-28 MED ORDER — NITROFURANTOIN MONOHYD MACRO 100 MG PO CAPS: 100.0000 mg | ORAL_CAPSULE | Freq: Two times a day (BID) | ORAL | 0 refills | Status: DC

## 2017-11-28 MED ORDER — CYANOCOBALAMIN 1000 MCG PO TABS: 1000.0000 ug | ORAL_TABLET | Freq: Every day | ORAL | 1 refills | Status: DC

## 2017-11-28 MED ORDER — BUSPIRONE HCL 10 MG PO TABS: 10.0000 mg | ORAL_TABLET | Freq: Three times a day (TID) | ORAL | 3 refills | Status: DC

## 2017-11-28 MED ORDER — MIRTAZAPINE 15 MG PO TABS: 15.0000 mg | ORAL_TABLET | Freq: Every day | ORAL | 3 refills | Status: DC

## 2017-11-28 MED ORDER — ONDANSETRON 8 MG PO TBDP: 8.0000 mg | ORAL_TABLET | Freq: Three times a day (TID) | ORAL | 0 refills | Status: DC | PRN

## 2017-11-28 MED ORDER — INSULIN DETEMIR 100 UNIT/ML ~~LOC~~ SOLN: 15.0000 [IU] | SUBCUTANEOUS | 1 refills | Status: DC

## 2017-11-28 MED ORDER — PANTOPRAZOLE SODIUM 40 MG PO TBEC: 40.0000 mg | DELAYED_RELEASE_TABLET | Freq: Every day | ORAL | 3 refills | Status: DC

## 2017-11-28 MED FILL — MIRTAZAPINE 15 MG TABS: 15 | 30 days supply | Qty: 30 | Fill #0

## 2017-11-28 MED FILL — ?FUROSEMIDE 20 MG TABLET: 20 | 30 days supply | Qty: 30 | Fill #0

## 2017-11-28 MED FILL — busPIRone HCL 10 MG TABS: 10 | 30 days supply | Qty: 90 | Fill #0

## 2017-11-28 MED FILL — ONDANSETRON ODT 8 MG TABLET: 8 | 10 days supply | Qty: 30 | Fill #0

## 2017-11-28 MED FILL — $LEVEMIR 100U/ML VIAL: 100 | 42 days supply | Qty: 10 | Fill #0

## 2017-11-28 MED FILL — NITROFURANTOIN MONO-MCR 100: 100 | 10 days supply | Qty: 20 | Fill #0

## 2017-11-28 MED FILL — ?PANTOPRAZOLE SO DR 40MG TA: 40 | 30 days supply | Qty: 30 | Fill #0

## 2017-11-28 NOTE — Telephone Encounter (Signed)
Met with the patient when she was in the clinic today and assisted her with completing a SCAT application. The application was then faxed to SCAT eligibility.  She said that she is currently living with her sister but would eventually like to have an apartment of hew own. Informed her of the open registration with [employer] and explained that she needs to register on -line.  Provided her with the web-site information. Also discussed [email or web address] but she is not interested in locating an apartment at this time, stating that she is in no hurry to leave her sister.  She noted that she will need to take a cab to her medical appointments if she is not approved for SCAT in time. She also said that her sister will be able to take her to an appointment in [place].   She left with some of her medication refills from [facility] and said that her sister will pick up the remaining medications this afternoon or tomorrow. She has a [employer] and also understands that she is eligible to put the charges for medication on an account with the pharmacy if she is not able to afford them.

## 2017-11-28 NOTE — Progress Notes (Signed)
[place]  Date of Telephone Encounter: 11/27/2017  Date of 1st service: 11/28/2017   Admit Date: 5/31//2019 Discharge Date: 11/25/2017  PCP: Dr Margarita Rana    Subjective:  Patient ID: [name], female    DOB: 13-Feb-1966  Age: 52 y.o. MRN: [id number]  CC: Diabetes   HPI [name] is a 52 year old female with a history of uncontrolled type 1 diabetes mellitus (A1c 10.6), diabetic neuropathy, diabetic gastroparesis, hypertension, anemia, anxiety and depression, multiple ED visits and hospitalizations for diabetic gastroparesis here for a follow-up at the transitional care clinic after hospitalization for diabetic ketoacidosis at [facility] for DKA. She had presented with persistent vomiting and abdominal discomfort, found to have a blood sugar of 497 with an anion gap of 22, ketonuria and was commenced on IV fluids and IV insulin.  Hospital course was complicated by early morning hypoglycemia of 27 which responded to hypoglycemia treatment. She was subsequently discharged on 15 units of Levemir to continue with her NovoLog sliding scale.  She denies hypoglycemia since discharge but then complains of weakness and difficulty moving her muscles and, pedal edema and some dyspnea on exertion and is requesting physical therapy.  Echo from 04/2015 reveals EF of 55 to 60%. She complains that her gastroparesis symptoms were previously controlled on Mestinon however she has noticed reduction in her bowel movements and thinks she probably needs an increased dose of Mestinon.  She has an upcoming appointment with [facility] in 4 days and with [facility] next month.  She is also scheduled to see endocrine, Dr. Dwyane Dee on 12/24/2017. Blood sugar in the clinic is 35 and she denies hypoglycemia is at home; she usually has a bedtime snack and high sugars are usually before dinner - in the 200s. Review of hospital labs revealed a urine culture positive for E. coli  and Macrobid had been sent to her pharmacy which she never picked up.  Med list reveals she is on Flomax for urinary retention but she informs me she does not have those symptoms any longer.  Also diagnosed of vitamin B deficiency during a previous hospitalization however she has not been on vitamin B12 injections as prescribed.   Past Medical History:  Diagnosis Date  . Depression   . Diabetes mellitus without complication ([place])   . Essential hypertension   . Gastroparesis   . GERD (gastroesophageal reflux disease)   . HLD (hyperlipidemia)     Past Surgical History:  Procedure Laterality Date  . COLONOSCOPY  09/27/2014   at [facility]  . ESOPHAGOGASTRODUODENOSCOPY  09/27/2014   at [facility], Dr Rolan Lipa. biospy neg for celiac, neg for H pylori.   . EYE SURGERY    . gailstones    . IR FLUORO GUIDE CV LINE RIGHT  02/01/2017  . IR FLUORO GUIDE CV LINE RIGHT  03/06/2017  . IR FLUORO GUIDE CV LINE RIGHT  03/25/2017  . IR GENERIC HISTORICAL  01/24/2016   IR FLUORO GUIDE CV LINE RIGHT 01/24/2016 Darrell K Allred, PA-C [facility]  . IR GENERIC HISTORICAL  01/24/2016   IR US GUIDE VASC ACCESS RIGHT 01/24/2016 Darrell K Allred, PA-C [facility]  . IR US GUIDE VASC ACCESS RIGHT  02/01/2017  . IR US GUIDE VASC ACCESS RIGHT  03/06/2017  . IR US GUIDE VASC ACCESS RIGHT  03/25/2017  . POSTERIOR VITRECTOMY AND MEMBRANE PEEL-LEFT EYE  09/28/2002  . POSTERIOR VITRECTOMY AND MEMBRANE PEEL-RIGHT EYE  03/16/2002  . RETINAL DETACHMENT SURGERY  Allergies  Allergen Reactions  . Anesthetics, Amide Nausea And Vomiting  . Penicillins Diarrhea, Nausea And Vomiting and Other (See Comments)    Has patient had a PCN reaction causing immediate rash, facial/tongue/throat swelling, SOB or lightheadedness with hypotension: Yes Has patient had a PCN reaction causing severe rash involving mucus membranes or skin necrosis: No Has patient had a PCN reaction that required hospitalization No Has patient had a PCN reaction  occurring within the last 10 years: Yes  If all of the above answers are "NO", then may proceed with Cephalosporin use.   . Buprenorphine Hcl Rash  . Encainide Nausea And Vomiting  . Metoclopramide Other (See Comments)    Dystonia, muscle rigidity.      Outpatient Medications Prior to Visit  Medication Sig Dispense Refill  . insulin aspart (NOVOLOG) 100 UNIT/ML injection Inject 0-12 Units into the skin 4 (four) times daily -  before meals and at bedtime. Uses a sliding scale: 70-119 = 0 units, 120-150 = 2 units, 151-180 =  4 units, 181-210 = 5 units, 211-240 = 6 units, 241-270 = 8 units, 271-300 = 9 units, 301-330 = 10 units, 331-360 = 11 units, 361-400 = 12 units, greater than 400 notify MD.    . Insulin Pen Needle (B-D UF III MINI PEN NEEDLES) 31G X 5 MM MISC Use to inject insulin twice daily. 100 each 2  . polyethylene glycol (MIRALAX / GLYCOLAX) packet Take 17 g by mouth daily as needed for mild constipation.     . pregabalin (LYRICA) 150 MG capsule Take 1 capsule (150 mg total) by mouth daily. 180 capsule 1  . pyridostigmine (MESTINON) 60 MG tablet Take 0.5 tablets (30 mg total) by mouth 3 (three) times daily. Take 1/2 with meals 60 tablet 0  . Syringe/Needle, Disp, (SYRINGE 3CC/22GX1-1/2") 22G X 1-1/2" 3 ML MISC Inject 1,000 mcg into the muscle daily. 50 each 0  . busPIRone (BUSPAR) 10 MG tablet Take 1 tablet (10 mg total) by mouth 3 (three) times daily. 90 tablet 2  . furosemide (LASIX) 20 MG tablet Take 20 mg by mouth daily as needed for fluid or edema.     . insulin detemir (LEVEMIR) 100 UNIT/ML injection Inject 0.15 mLs (15 Units total) into the skin every morning. 10 mL 0  . mirtazapine (REMERON) 15 MG tablet Take 1 tablet (15 mg total) by mouth at bedtime. 30 tablet 1  . nitrofurantoin, macrocrystal-monohydrate, (MACROBID) 100 MG capsule Take 1 capsule (100 mg total) by mouth 2 (two) times daily. 20 capsule 0  . ondansetron (ZOFRAN-ODT) 8 MG disintegrating tablet Take 1 tablet (8  mg total) by mouth every 8 (eight) hours as needed for nausea or vomiting. 30 tablet 0  . pantoprazole (PROTONIX) 40 MG tablet Take 1 tablet (40 mg total) by mouth daily. 30 tablet 3  . tamsulosin (FLOMAX) 0.4 MG CAPS capsule Take 1 capsule (0.4 mg total) by mouth daily after supper. 30 capsule 0  . clonazePAM (KLONOPIN) 0.5 MG disintegrating tablet Take 1 tablet (0.5 mg total) by mouth 2 (two) times daily. (Patient not taking: Reported on 11/28/2017) 60 tablet 0  . fluticasone (FLONASE) 50 MCG/ACT nasal spray Place 2 sprays into both nostrils daily. (Patient not taking: Reported on 11/28/2017) 16 g 0  . loratadine (CLARITIN) 10 MG tablet Take 1 tablet (10 mg total) by mouth daily. (Patient not taking: Reported on 11/28/2017) 30 tablet 0  . cyanocobalamin (,VITAMIN B-12,) 1000 MCG/ML injection Inject 1 mL (1,000 mcg total) into  the muscle daily. 1000 MCG's IM daily x3 days, then 1000 MCG's IM weekly x1 month, then 1000 MCG's IM monthly. (Patient not taking: Reported on 11/28/2017) 10 mL 0   No facility-administered medications prior to visit.     ROS Review of Systems  Constitutional: Negative for activity change, appetite change and fatigue.  HENT: Negative for congestion, sinus pressure and sore throat.   Eyes: Negative for visual disturbance.  Respiratory: Positive for shortness of breath. Negative for cough, chest tightness and wheezing.   Cardiovascular: Positive for leg swelling. Negative for chest pain and palpitations.  Gastrointestinal: Negative for abdominal distention, abdominal pain and constipation.  Endocrine: Negative for polydipsia.  Genitourinary: Negative for dysuria and frequency.  Musculoskeletal: Negative for arthralgias and back pain.  Skin: Negative for rash.  Neurological: Negative for tremors, light-headedness and numbness.  Hematological: Does not bruise/bleed easily.  Psychiatric/Behavioral: Negative for agitation and behavioral problems.    Objective:  BP 122/70    Pulse 82   Temp (!) 97.5 F (36.4 C) (Oral)   Ht 5\' 2"  (1.575 m)   Wt 148 lb (67.1 kg)   SpO2 100%   BMI 27.07 kg/m   BP/Weight 11/28/2017 11/25/2017 0/27/7412  Systolic BP 878 676 -  Diastolic BP 70 73 -  Wt. (Lbs) 148 - 151.68  BMI 27.07 - 26.87      Physical Exam  Constitutional: She is oriented to person, place, and time. She appears well-developed and well-nourished.  Cardiovascular: Normal rate, normal heart sounds and intact distal pulses.  No murmur heard. Pulmonary/Chest: Effort normal and breath sounds normal. She has no wheezes. She has no rales. She exhibits no tenderness.  Abdominal: Soft. Bowel sounds are normal. She exhibits no distension and no mass. There is no tenderness.  Musculoskeletal: Normal range of motion. She exhibits edema (2+ b/l non pitting pedal edema).  Neurological: She is alert and oriented to person, place, and time.  Skin: Skin is warm and dry.    CMP Latest Ref Rng & Units 11/25/2017 11/24/2017 11/23/2017  Glucose 65 - 99 mg/dL 113(H) 70 175(H)  BUN 6 - 20 mg/dL 16 21(H) 24(H)  Creatinine 0.44 - 1.00 mg/dL 0.99 1.45(H) 1.51(H)  Sodium 135 - 145 mmol/L 140 139 141  Potassium 3.5 - 5.1 mmol/L 3.8 3.7 4.3  Chloride 101 - 111 mmol/L 112(H) 115(H) 112(H)  CO2 22 - 32 mmol/L 21(L) 17(L) 19(L)  Calcium 8.9 - 10.3 mg/dL 8.5(L) 8.3(L) 8.8(L)  Total Protein 6.5 - 8.1 g/dL - - -  Total Bilirubin 0.3 - 1.2 mg/dL - - -  Alkaline Phos 38 - 126 U/L - - -  AST 15 - 41 U/L - - -  ALT 14 - 54 U/L - - -    Lab Results  Component Value Date   HGBA1C 10.6 (H) 11/07/2017     Assessment & Plan:   1. Diabetic neuropathy, type I diabetes mellitus ([place]) Uncontrolled diabetes mellitus with A1c of 10.6 which is above goal of greater than 7 She also continues to have intermittent hypoglycemia which affects her ability to uptitrate her dose Continue NovoLog Keep upcoming appointment with endocrine Counseled on blood pressure goal of less than 130/80, low-sodium,  DASH diet, medication compliance, 150 minutes of moderate intensity exercise per week. Discussed medication compliance, adverse effects. - POCT glucose (manual entry) - insulin detemir (LEVEMIR) 100 UNIT/ML injection; Inject 0.15 mLs (15 Units total) into the skin every morning.  Dispense: 10 mL; Refill: 1  2. Anxiety and  depression Stable Remeron helps with sleep - busPIRone (BUSPAR) 10 MG tablet; Take 1 tablet (10 mg total) by mouth 3 (three) times daily.  Dispense: 90 tablet; Refill: 3 - mirtazapine (REMERON) 15 MG tablet; Take 1 tablet (15 mg total) by mouth at bedtime.  Dispense: 30 tablet; Refill: 3  3. Diabetic gastroparesis ([place]) Continue Mestinon Currently followed by GI at [facility] - pantoprazole (PROTONIX) 40 MG tablet; Take 1 tablet (40 mg total) by mouth daily.  Dispense: 30 tablet; Refill: 3 - ondansetron (ZOFRAN-ODT) 8 MG disintegrating tablet; Take 1 tablet (8 mg total) by mouth every 8 (eight) hours as needed for nausea or vomiting.  Dispense: 30 tablet; Refill: 0  4. Physical deconditioning - Ambulatory referral to Home Health  5. Vitamin B12 deficiency Prescribed B12 injections which she never commenced Compliance with oral B12 might be more effective We will check levels at next visit - vitamin B-12 1000 MCG tablet; Take 1 tablet (1,000 mcg total) by mouth daily.  Dispense: 30 tablet; Refill: 1  6. Acute cystitis without hematuria Urine culture from most recent hospitalization revealed UTI Flomax discontinued as she no longer has urinary retention. - nitrofurantoin, macrocrystal-monohydrate, (MACROBID) 100 MG capsule; Take 1 capsule (100 mg total) by mouth 2 (two) times daily.  Dispense: 20 capsule; Refill: 0  7. Pedal edema Advised to cut back on sodium intake, elevate feet and use compression stockings - furosemide (LASIX) 20 MG tablet; Take 1 tablet (20 mg total) by mouth daily as needed for fluid or edema.  Dispense: 30  tablet; Refill: 9   Meds ordered this encounter  Medications  . busPIRone (BUSPAR) 10 MG tablet    Sig: Take 1 tablet (10 mg total) by mouth 3 (three) times daily.    Dispense:  90 tablet    Refill:  3  . insulin detemir (LEVEMIR) 100 UNIT/ML injection    Sig: Inject 0.15 mLs (15 Units total) into the skin every morning.    Dispense:  10 mL    Refill:  1  . mirtazapine (REMERON) 15 MG tablet    Sig: Take 1 tablet (15 mg total) by mouth at bedtime.    Dispense:  30 tablet    Refill:  3  . pantoprazole (PROTONIX) 40 MG tablet    Sig: Take 1 tablet (40 mg total) by mouth daily.    Dispense:  30 tablet    Refill:  3  . ondansetron (ZOFRAN-ODT) 8 MG disintegrating tablet    Sig: Take 1 tablet (8 mg total) by mouth every 8 (eight) hours as needed for nausea or vomiting.    Dispense:  30 tablet    Refill:  0  . vitamin B-12 1000 MCG tablet    Sig: Take 1 tablet (1,000 mcg total) by mouth daily.    Dispense:  30 tablet    Refill:  1  . nitrofurantoin, macrocrystal-monohydrate, (MACROBID) 100 MG capsule    Sig: Take 1 capsule (100 mg total) by mouth 2 (two) times daily.    Dispense:  20 capsule    Refill:  0  . furosemide (LASIX) 20 MG tablet    Sig: Take 1 tablet (20 mg total) by mouth daily as needed for fluid or edema.    Dispense:  30 tablet    Refill:  9    Follow-up: Follow-up of vitamin B12 deficiency and diabetes mellitus in 1 month.  [name] MD

## 2017-11-28 NOTE — Patient Instructions (Signed)
Vitamin B12 Deficiency Vitamin B12 deficiency means that your body is not getting enough vitamin B12. Your body needs vitamin B12 for important bodily functions. If you do not have enough vitamin B12 in your body, you can have health problems. Follow these instructions at home:  Take supplements only as told by your doctor. Follow the directions carefully.  Get any shots (injections) as told by your doctor. Do not miss your visits to the doctor.  Eat lots of healthy foods that contain vitamin B12. Ask your doctor if you should work with someone who is trained in how food affects health (dietitian). Foods that contain vitamin B12 include: ? Meat. ? Meat from birds (poultry). ? Fish. ? Eggs. ? Cereal and dairy products that are fortified. This means that vitamin B12 has been added to the food. Check the label on the package to see if the food is fortified.  Do not drink too much (do not abuse) alcohol.  Keep all follow-up visits as told by your doctor. This is important. Contact a doctor if:  Your symptoms come back. Get help right away if:  You have trouble breathing.  You have chest pain.  You get dizzy.  You pass out (lose consciousness). This information is not intended to replace advice given to you by your health care provider. Make sure you discuss any questions you have with your health care provider. Document Released: 05/31/2011 Document Revised: 11/17/2015 Document Reviewed: 10/27/2014 Elsevier Interactive Patient Education  2018 Elsevier Inc.  

## 2017-11-29 ENCOUNTER — Telehealth: Payer: Self-pay | Admitting: Family Medicine

## 2017-11-29 NOTE — Telephone Encounter (Signed)
Patient's referral for home health was faxed to [place] [phone].

## 2017-12-02 ENCOUNTER — Telehealth: Payer: Self-pay

## 2017-12-02 NOTE — Telephone Encounter (Signed)
Call received from the patient stating that she has an appointment for a SCAT assessment on 12/04/2017

## 2017-12-02 NOTE — Telephone Encounter (Signed)
Call placed to [facility], spoke to [facility] who confirmed receipt of the referral.

## 2017-12-03 ENCOUNTER — Encounter: Payer: Self-pay | Admitting: Internal Medicine

## 2017-12-03 ENCOUNTER — Ambulatory Visit (INDEPENDENT_AMBULATORY_CARE_PROVIDER_SITE_OTHER): Payer: Medicare Other | Admitting: Internal Medicine

## 2017-12-03 VITALS — BP 124/50 | HR 88 | Ht 60.5 in | Wt 148.1 lb

## 2017-12-03 DIAGNOSIS — E1143 Type 2 diabetes mellitus with diabetic autonomic (poly)neuropathy: Secondary | ICD-10-CM | POA: Diagnosis not present

## 2017-12-03 DIAGNOSIS — G43A0 Cyclical vomiting, not intractable: Secondary | ICD-10-CM

## 2017-12-03 DIAGNOSIS — K5900 Constipation, unspecified: Secondary | ICD-10-CM | POA: Diagnosis not present

## 2017-12-03 DIAGNOSIS — E1165 Type 2 diabetes mellitus with hyperglycemia: Secondary | ICD-10-CM

## 2017-12-03 DIAGNOSIS — R1115 Cyclical vomiting syndrome unrelated to migraine: Secondary | ICD-10-CM

## 2017-12-03 DIAGNOSIS — K3184 Gastroparesis: Secondary | ICD-10-CM

## 2017-12-03 NOTE — Patient Instructions (Signed)
You have been given samples of Linzess 282mcg.  Please follow up with [facility]

## 2017-12-04 ENCOUNTER — Encounter: Payer: Self-pay | Admitting: Internal Medicine

## 2017-12-04 ENCOUNTER — Telehealth: Payer: Self-pay | Admitting: Family Medicine

## 2017-12-04 MED FILL — PYRIDOSTIGMINE BR 60 MG TAB: 60 | 30 days supply | Qty: 45 | Fill #0

## 2017-12-04 NOTE — Progress Notes (Signed)
HISTORY OF PRESENT ILLNESS:  [name] is a 52 y.o. female With long-standing diabetes mellitus which has been poorly controlled, gastroparesis, and chronic constipation. She has been seen by multiple gastroenterologists in multiple locations. She has been in and out of the hospital with recurrent nausea and vomiting. Hospitalized in mid May. Hemoglobin A1c 10.6 at that time. She is status post cholecystectomy. She has been seen by motility experts at [facility]. She had been placed on Mestinon. She has been on various agents for her constipation with disappointing results. She does think that linzess helped previously but cannot remember. She was seen in this office earlier this year for ongoing problems. It was recommended that she continue with the tertiary care specialists due to the complex nature of her disease. Complaints today are ongoing constipation. She requests increased dosage of her Mestinon prescribed elsewhere. She does take pantoprazole for GERD. Occasional Zofran for nausea.  REVIEW OF SYSTEMS:  All non-GI ROS negative was otherwise stated in the history of present illness except for sinus and allergy, anxiety, depression, visual change, ankle swelling, shortness of breath  Past Medical History:  Diagnosis Date  . Depression   . Diabetes mellitus without complication ([place])   . Essential hypertension   . Gastroparesis   . GERD (gastroesophageal reflux disease)   . HLD (hyperlipidemia)     Past Surgical History:  Procedure Laterality Date  . COLONOSCOPY  09/27/2014   at [facility]  . ESOPHAGOGASTRODUODENOSCOPY  09/27/2014   at [facility], Dr Rolan Lipa. biospy neg for celiac, neg for H pylori.   . EYE SURGERY    . gailstones    . IR FLUORO GUIDE CV LINE RIGHT  02/01/2017  . IR FLUORO GUIDE CV LINE RIGHT  03/06/2017  . IR FLUORO GUIDE CV LINE RIGHT  03/25/2017  . IR GENERIC HISTORICAL  01/24/2016   IR FLUORO GUIDE CV LINE RIGHT 01/24/2016 Darrell K Allred, PA-C [facility]   . IR GENERIC HISTORICAL  01/24/2016   IR US GUIDE VASC ACCESS RIGHT 01/24/2016 Darrell K Allred, PA-C [facility]  . IR US GUIDE VASC ACCESS RIGHT  02/01/2017  . IR US GUIDE VASC ACCESS RIGHT  03/06/2017  . IR US GUIDE VASC ACCESS RIGHT  03/25/2017  . POSTERIOR VITRECTOMY AND MEMBRANE PEEL-LEFT EYE  09/28/2002  . POSTERIOR VITRECTOMY AND MEMBRANE PEEL-RIGHT EYE  03/16/2002  . RETINAL DETACHMENT SURGERY      Social History [name]  reports that she has never smoked. She has never used smokeless tobacco. She reports that she does not drink alcohol or use drugs.  family history includes Cystic fibrosis in her mother; Diabetes in her brother; Hypertension in her father and maternal grandmother.  Allergies  Allergen Reactions  . Anesthetics, Amide Nausea And Vomiting  . Penicillins Diarrhea, Nausea And Vomiting and Other (See Comments)    Has patient had a PCN reaction causing immediate rash, facial/tongue/throat swelling, SOB or lightheadedness with hypotension: Yes Has patient had a PCN reaction causing severe rash involving mucus membranes or skin necrosis: No Has patient had a PCN reaction that required hospitalization No Has patient had a PCN reaction occurring within the last 10 years: Yes  If all of the above answers are "NO", then may proceed with Cephalosporin use.   . Buprenorphine Hcl Rash  . Encainide Nausea And Vomiting  . Metoclopramide Other (See Comments)    Dystonia, muscle rigidity.        PHYSICAL EXAMINATION: Vital signs: BP (!) 124/50 (BP Location:  Left Arm, Patient Position: Sitting, Cuff Size: Normal)   Pulse 88   Ht 5' 0.5" (1.537 m) Comment: height measured without shoes  Wt 148 lb 2 oz (67.2 kg)   LMP 11/04/2017   BMI 28.45 kg/m   Constitutional: generally well-appearing, no acute distress Psychiatric: alert and oriented x3, cooperative Eyes: extraocular movements intact, anicteric, conjunctiva pink Mouth: oral pharynx moist, no lesions Neck: supple  no lymphadenopathy Cardiovascular: heart regular rate and rhythm, no murmur Lungs: clear to auscultation bilaterally Abdomen: soft, nontender, nondistended, no obvious ascites, no peritoneal signs, normal bowel sounds, no organomegaly Rectal:omitted Extremities: no clubbing, cyanosis, orlower extremity edema bilaterally Skin: no lesions on visible extremities Neuro: No focal deficits. Cranial nerves intact  ASSESSMENT:  #1. Poorly controlled diabetes mellitus #2. Recurrent problems with nausea and vomiting attributed to gastroparesis #3. Chronic constipation   PLAN  #1. Good glycemic control critical. I stressed this #2. Continue PPI #3. Continue Zofran as needed #4. Linzess 290 g samples provided for constipation #5. Discussed rescue measures for constipation such as magnesium citrate #6. Instructed to return to [facility] with the GI motility experts regarding her issues with chronic gastroparesis and chronic constipation. This in particular given the multiple medications that have been either ineffective or resulted in significant side effects as previously outlined. #7. Resume general medical care with PCP

## 2017-12-04 NOTE — Telephone Encounter (Signed)
Jeannine physical therapist from [place] called to inform that she can not get in touch with patient and has called all the phone numbers in patient chart. She has been trying to get in touch with her since 11/30/17 and is going to do a drive to her house to see if she is okay.   Please Follow Up [phone]

## 2017-12-10 ENCOUNTER — Inpatient Hospital Stay (HOSPITAL_COMMUNITY)
Admission: EM | Admit: 2017-12-10 | Discharge: 2017-12-15 | DRG: 638 | Disposition: A | Discharge status: Home / Self Care | Payer: Medicare Other | Attending: Internal Medicine | Admitting: Internal Medicine

## 2017-12-10 ENCOUNTER — Observation Stay (HOSPITAL_COMMUNITY): Payer: Medicare Other

## 2017-12-10 ENCOUNTER — Encounter (HOSPITAL_COMMUNITY): Payer: Self-pay

## 2017-12-10 ENCOUNTER — Other Ambulatory Visit: Payer: Self-pay

## 2017-12-10 DIAGNOSIS — I10 Essential (primary) hypertension: Secondary | ICD-10-CM | POA: Diagnosis not present

## 2017-12-10 DIAGNOSIS — R402413 Glasgow coma scale score 13-15, at hospital admission: Secondary | ICD-10-CM | POA: Diagnosis present

## 2017-12-10 DIAGNOSIS — E1043 Type 1 diabetes mellitus with diabetic autonomic (poly)neuropathy: Secondary | ICD-10-CM | POA: Diagnosis present

## 2017-12-10 DIAGNOSIS — D72829 Elevated white blood cell count, unspecified: Secondary | ICD-10-CM | POA: Diagnosis present

## 2017-12-10 DIAGNOSIS — Z885 Allergy status to narcotic agent status: Secondary | ICD-10-CM

## 2017-12-10 DIAGNOSIS — K3184 Gastroparesis: Secondary | ICD-10-CM | POA: Diagnosis present

## 2017-12-10 DIAGNOSIS — F419 Anxiety disorder, unspecified: Secondary | ICD-10-CM | POA: Diagnosis present

## 2017-12-10 DIAGNOSIS — R0602 Shortness of breath: Secondary | ICD-10-CM | POA: Diagnosis not present

## 2017-12-10 DIAGNOSIS — E1310 Other specified diabetes mellitus with ketoacidosis without coma: Secondary | ICD-10-CM | POA: Diagnosis not present

## 2017-12-10 DIAGNOSIS — E538 Deficiency of other specified B group vitamins: Secondary | ICD-10-CM | POA: Diagnosis present

## 2017-12-10 DIAGNOSIS — R1084 Generalized abdominal pain: Secondary | ICD-10-CM | POA: Diagnosis not present

## 2017-12-10 DIAGNOSIS — F439 Reaction to severe stress, unspecified: Secondary | ICD-10-CM | POA: Diagnosis present

## 2017-12-10 DIAGNOSIS — N179 Acute kidney failure, unspecified: Secondary | ICD-10-CM | POA: Diagnosis not present

## 2017-12-10 DIAGNOSIS — R110 Nausea: Secondary | ICD-10-CM

## 2017-12-10 DIAGNOSIS — Z9119 Patient's noncompliance with other medical treatment and regimen: Secondary | ICD-10-CM

## 2017-12-10 DIAGNOSIS — Z789 Other specified health status: Secondary | ICD-10-CM

## 2017-12-10 DIAGNOSIS — E1110 Type 2 diabetes mellitus with ketoacidosis without coma: Secondary | ICD-10-CM | POA: Diagnosis present

## 2017-12-10 DIAGNOSIS — E1165 Type 2 diabetes mellitus with hyperglycemia: Secondary | ICD-10-CM | POA: Diagnosis not present

## 2017-12-10 DIAGNOSIS — E1010 Type 1 diabetes mellitus with ketoacidosis without coma: Secondary | ICD-10-CM | POA: Diagnosis not present

## 2017-12-10 DIAGNOSIS — E860 Dehydration: Secondary | ICD-10-CM | POA: Diagnosis not present

## 2017-12-10 DIAGNOSIS — R1111 Vomiting without nausea: Secondary | ICD-10-CM | POA: Diagnosis not present

## 2017-12-10 DIAGNOSIS — Z884 Allergy status to anesthetic agent status: Secondary | ICD-10-CM

## 2017-12-10 DIAGNOSIS — E1143 Type 2 diabetes mellitus with diabetic autonomic (poly)neuropathy: Secondary | ICD-10-CM

## 2017-12-10 DIAGNOSIS — F329 Major depressive disorder, single episode, unspecified: Secondary | ICD-10-CM | POA: Diagnosis present

## 2017-12-10 DIAGNOSIS — R52 Pain, unspecified: Secondary | ICD-10-CM | POA: Diagnosis not present

## 2017-12-10 DIAGNOSIS — D509 Iron deficiency anemia, unspecified: Secondary | ICD-10-CM | POA: Diagnosis not present

## 2017-12-10 DIAGNOSIS — Z79899 Other long term (current) drug therapy: Secondary | ICD-10-CM

## 2017-12-10 DIAGNOSIS — T383X6A Underdosing of insulin and oral hypoglycemic [antidiabetic] drugs, initial encounter: Secondary | ICD-10-CM | POA: Diagnosis present

## 2017-12-10 DIAGNOSIS — Z888 Allergy status to other drugs, medicaments and biological substances status: Secondary | ICD-10-CM

## 2017-12-10 DIAGNOSIS — Z880 Allergy status to penicillin: Secondary | ICD-10-CM

## 2017-12-10 DIAGNOSIS — K219 Gastro-esophageal reflux disease without esophagitis: Secondary | ICD-10-CM | POA: Diagnosis present

## 2017-12-10 DIAGNOSIS — K5909 Other constipation: Secondary | ICD-10-CM | POA: Diagnosis present

## 2017-12-10 LAB — COMPREHENSIVE METABOLIC PANEL
ALBUMIN: 4.4 g/dL (ref 3.5–5.0)
ALT: 23 U/L (ref 14–54)
ANION GAP: 26 — AB (ref 5–15)
AST: 29 U/L (ref 15–41)
Alkaline Phosphatase: 104 U/L (ref 38–126)
BUN: 23 mg/dL — ABNORMAL HIGH (ref 6–20)
CALCIUM: 9.6 mg/dL (ref 8.9–10.3)
CO2: 9 mmol/L — AB (ref 22–32)
CREATININE: 1.66 mg/dL — AB (ref 0.44–1.00)
Chloride: 100 mmol/L — ABNORMAL LOW (ref 101–111)
GFR calc Af Amer: 41 mL/min — ABNORMAL LOW (ref >60)
GFR calc non Af Amer: 35 mL/min — ABNORMAL LOW (ref >60)
GLUCOSE: 758 mg/dL — AB (ref 65–99)
Potassium: 5.7 mmol/L — ABNORMAL HIGH (ref 3.5–5.1)
SODIUM: 135 mmol/L (ref 135–145)
Total Bilirubin: 1.4 mg/dL — ABNORMAL HIGH (ref 0.3–1.2)
Total Protein: 8.2 g/dL — ABNORMAL HIGH (ref 6.5–8.1)

## 2017-12-10 LAB — URINALYSIS, ROUTINE W REFLEX MICROSCOPIC
Bacteria, UA: NONE SEEN
Bilirubin Urine: NEGATIVE
Glucose, UA: >=500 mg/dL — AB
Ketones, ur: 80 mg/dL — AB
Leukocytes, UA: NEGATIVE
Nitrite: NEGATIVE
Protein, ur: 30 mg/dL — AB
RBC / HPF: >50 RBC/hpf — ABNORMAL HIGH (ref 0–5)
SPECIFIC GRAVITY, URINE: 1.02 (ref 1.005–1.030)
pH: 5 (ref 5.0–8.0)

## 2017-12-10 LAB — BLOOD GAS, ARTERIAL
Drawn by: 257881
FIO2: 0.21
O2 Saturation: 97.2 %
PH ART: 7.235 — AB (ref 7.350–7.450)
Patient temperature: 98.6
pO2, Arterial: 121 mmHg — ABNORMAL HIGH (ref 83.0–108.0)

## 2017-12-10 LAB — BASIC METABOLIC PANEL
ANION GAP: 20 — AB (ref 5–15)
Anion gap: 12 (ref 5–15)
BUN: 22 mg/dL — AB (ref 6–20)
BUN: 22 mg/dL — AB (ref 6–20)
CALCIUM: 9.4 mg/dL (ref 8.9–10.3)
CO2: 13 mmol/L — ABNORMAL LOW (ref 22–32)
CO2: 16 mmol/L — ABNORMAL LOW (ref 22–32)
CREATININE: 1.44 mg/dL — AB (ref 0.44–1.00)
Calcium: 9.9 mg/dL (ref 8.9–10.3)
Chloride: 112 mmol/L — ABNORMAL HIGH (ref 101–111)
Chloride: 118 mmol/L — ABNORMAL HIGH (ref 101–111)
Creatinine, Ser: 1.66 mg/dL — ABNORMAL HIGH (ref 0.44–1.00)
GFR calc Af Amer: 41 mL/min — ABNORMAL LOW (ref >60)
GFR calc Af Amer: 48 mL/min — ABNORMAL LOW (ref >60)
GFR, EST NON AFRICAN AMERICAN: 35 mL/min — AB (ref >60)
GFR, EST NON AFRICAN AMERICAN: 42 mL/min — AB (ref >60)
Glucose, Bld: 360 mg/dL — ABNORMAL HIGH (ref 65–99)
Glucose, Bld: 236 mg/dL — ABNORMAL HIGH (ref 65–99)
POTASSIUM: 5.1 mmol/L (ref 3.5–5.1)
POTASSIUM: 4.3 mmol/L (ref 3.5–5.1)
SODIUM: 145 mmol/L (ref 135–145)
SODIUM: 146 mmol/L — AB (ref 135–145)

## 2017-12-10 LAB — CBC
HCT: 39.9 % (ref 36.0–46.0)
HEMOGLOBIN: 12.4 g/dL (ref 12.0–15.0)
MCH: 27.4 pg (ref 26.0–34.0)
MCHC: 31.1 g/dL (ref 30.0–36.0)
MCV: 88.3 fL (ref 78.0–100.0)
Platelets: 345 10*3/uL (ref 150–400)
RBC: 4.52 MIL/uL (ref 3.87–5.11)
RDW: 17 % — ABNORMAL HIGH (ref 11.5–15.5)
WBC: 19.5 10*3/uL — ABNORMAL HIGH (ref 4.0–10.5)

## 2017-12-10 LAB — LIPASE, BLOOD: Lipase: 26 U/L (ref 11–51)

## 2017-12-10 LAB — CBG MONITORING, ED
GLUCOSE-CAPILLARY: 576 mg/dL — AB (ref 65–99)
GLUCOSE-CAPILLARY: 517 mg/dL — AB (ref 65–99)
GLUCOSE-CAPILLARY: 377 mg/dL — AB (ref 65–99)
Glucose-Capillary: >600 mg/dL (ref 65–99)
Glucose-Capillary: >600 mg/dL (ref 65–99)

## 2017-12-10 LAB — GLUCOSE, CAPILLARY
GLUCOSE-CAPILLARY: 253 mg/dL — AB (ref 65–99)
GLUCOSE-CAPILLARY: 209 mg/dL — AB (ref 65–99)

## 2017-12-10 LAB — I-STAT BETA HCG BLOOD, ED (MC, WL, AP ONLY)

## 2017-12-10 MED ORDER — POTASSIUM CHLORIDE 10 MEQ/100ML IV SOLN
10.0000 meq | INTRAVENOUS | Status: DC
  Filled 2017-12-10: qty 100

## 2017-12-10 MED ORDER — FAMOTIDINE IN NACL 20-0.9 MG/50ML-% IV SOLN
20.0000 mg | Freq: Two times a day (BID) | INTRAVENOUS | Status: DC
  Administered 2017-12-11 – 2017-12-12 (×4): 20 mg via INTRAVENOUS
  Filled 2017-12-10 (×5): qty 50

## 2017-12-10 MED ORDER — SODIUM CHLORIDE 0.9 % IV SOLN
INTRAVENOUS | Status: DC
  Administered 2017-12-10: 16:00:00 via INTRAVENOUS

## 2017-12-10 MED ORDER — SODIUM CHLORIDE 0.9 % IV SOLN: INTRAVENOUS | Status: DC

## 2017-12-10 MED ORDER — ENOXAPARIN SODIUM 40 MG/0.4ML ~~LOC~~ SOLN: 40.0000 mg | SUBCUTANEOUS | Status: DC

## 2017-12-10 MED ORDER — PROCHLORPERAZINE EDISYLATE 10 MG/2ML IJ SOLN
5.0000 mg | INTRAMUSCULAR | Status: DC | PRN
  Administered 2017-12-10 – 2017-12-12 (×2): 5 mg via INTRAVENOUS
  Filled 2017-12-10 (×2): qty 2

## 2017-12-10 MED ORDER — ONDANSETRON HCL 4 MG/2ML IJ SOLN: 4.0000 mg | Freq: Three times a day (TID) | INTRAMUSCULAR | Status: DC

## 2017-12-10 MED ORDER — ZOLPIDEM TARTRATE 5 MG PO TABS
5.0000 mg | ORAL_TABLET | Freq: Every evening | ORAL | Status: DC | PRN
  Administered 2017-12-10 – 2017-12-13 (×4): 5 mg via ORAL
  Filled 2017-12-10 (×4): qty 1

## 2017-12-10 MED ORDER — DEXTROSE-NACL 5-0.45 % IV SOLN: INTRAVENOUS | Status: DC

## 2017-12-10 MED ORDER — ONDANSETRON HCL 4 MG/2ML IJ SOLN
4.0000 mg | Freq: Once | INTRAMUSCULAR | Status: AC
  Administered 2017-12-10: 4 mg via INTRAVENOUS
  Filled 2017-12-10: qty 2

## 2017-12-10 MED ORDER — LORAZEPAM 2 MG/ML IJ SOLN
0.5000 mg | Freq: Once | INTRAMUSCULAR | Status: AC
  Administered 2017-12-10: 0.5 mg via INTRAVENOUS
  Filled 2017-12-10: qty 1

## 2017-12-10 MED ORDER — SODIUM CHLORIDE 0.9 % IV SOLN
INTRAVENOUS | Status: DC
  Administered 2017-12-10: 5.4 [IU]/h via INTRAVENOUS
  Filled 2017-12-10 (×3): qty 1

## 2017-12-10 MED ORDER — SODIUM CHLORIDE 0.9 % IV BOLUS
1000.0000 mL | Freq: Once | INTRAVENOUS | Status: AC
  Administered 2017-12-10: 1000 mL via INTRAVENOUS

## 2017-12-10 MED ORDER — DICYCLOMINE HCL 10 MG PO CAPS
10.0000 mg | ORAL_CAPSULE | Freq: Once | ORAL | Status: DC
  Filled 2017-12-10: qty 1

## 2017-12-10 MED ORDER — ONDANSETRON HCL 4 MG/2ML IJ SOLN
4.0000 mg | Freq: Four times a day (QID) | INTRAMUSCULAR | Status: DC | PRN
  Administered 2017-12-10: 4 mg via INTRAVENOUS
  Filled 2017-12-10: qty 2

## 2017-12-10 MED ORDER — DEXTROSE-NACL 5-0.45 % IV SOLN
INTRAVENOUS | Status: DC
  Administered 2017-12-10: 22:00:00 via INTRAVENOUS

## 2017-12-10 MED ORDER — HYDRALAZINE HCL 20 MG/ML IJ SOLN
10.0000 mg | Freq: Four times a day (QID) | INTRAMUSCULAR | Status: DC | PRN
  Administered 2017-12-10: 10 mg via INTRAVENOUS
  Filled 2017-12-10 (×2): qty 1

## 2017-12-10 MED ORDER — SODIUM CHLORIDE 0.9 % IV BOLUS
500.0000 mL | Freq: Once | INTRAVENOUS | Status: AC
  Administered 2017-12-10: 500 mL via INTRAVENOUS

## 2017-12-10 MED ORDER — ENOXAPARIN SODIUM 40 MG/0.4ML ~~LOC~~ SOLN
40.0000 mg | SUBCUTANEOUS | Status: DC
  Administered 2017-12-10 – 2017-12-14 (×5): 40 mg via SUBCUTANEOUS
  Filled 2017-12-10 (×5): qty 0.4

## 2017-12-10 MED ORDER — LORAZEPAM 2 MG/ML IJ SOLN
1.0000 mg | Freq: Once | INTRAMUSCULAR | Status: AC
  Administered 2017-12-10: 1 mg via INTRAMUSCULAR
  Filled 2017-12-10: qty 1

## 2017-12-10 MED ORDER — LORAZEPAM 0.5 MG PO TABS
0.5000 mg | ORAL_TABLET | Freq: Once | ORAL | Status: AC
  Administered 2017-12-10: 0.5 mg via ORAL
  Filled 2017-12-10: qty 1

## 2017-12-10 MED ORDER — SODIUM CHLORIDE 0.9 % IV SOLN
INTRAVENOUS | Status: DC
  Administered 2017-12-10: 21:00:00 via INTRAVENOUS

## 2017-12-10 NOTE — ED Triage Notes (Signed)
Per Ems:  Pt has had c/o of vomiting and abdominal pain for the past day.  Pt hx of gastroparesis. 4mg  of IM zofran given

## 2017-12-10 NOTE — ED Notes (Signed)
ED TO INPATIENT HANDOFF REPORT  Name/Age/Gender [name] 52 y.o. female  Code Status    Code Status Orders  (From admission, onward)        Start     Ordered   12/10/17 1836  Full code  Continuous     12/10/17 1835    Code Status History    Date Active Date Inactive Code Status Order ID Comments User Context   12/10/2017 1819 12/10/2017 1835 Full Code [id number]  Georgette Shell, MD ED   11/22/2017 1934 11/25/2017 2244 Full Code [id number]  Rise Patience, MD ED   11/05/2017 1835 11/11/2017 2134 Full Code [id number]  Hosie Poisson, MD Inpatient   07/10/2017 0751 07/12/2017 2045 Full Code [id number]  Roxan Hockey, MD Inpatient   06/24/2017 0139 06/25/2017 1749 Full Code [id number]  Etta Quill, DO ED   06/16/2017 0324 06/17/2017 1738 Full Code [id number]  Elwyn Reach, MD Inpatient   06/11/2017 0324 06/12/2017 2030 Full Code [id number]  Bonnielee Haff, MD ED   05/16/2017 1756 05/18/2017 1832 Full Code [id number]  Geradine Girt, DO Inpatient   05/06/2017 1130 05/09/2017 2049 Full Code [id number]  Donne Hazel, MD ED   03/25/2017 0742 04/04/2017 0144 Full Code [id number]  Scatliffe, Rise Paganini, MD Inpatient   03/25/2017 0653 03/25/2017 0742 Full Code [id number]  Scatliffe, Rise Paganini, MD Inpatient   03/16/2017 0606 03/20/2017 2040 Full Code [id number]  Jani Gravel, MD Inpatient   03/05/2017 1836 03/09/2017 1626 Full Code [id number]  Bonnielee Haff, MD Inpatient   02/05/2017 1118 02/06/2017 1706 Full Code [id number]  Merton Border, MD Inpatient   01/31/2017 2122 02/03/2017 1847 Full Code [id number]  Vashti Hey, MD Inpatient   09/27/2016 1833 09/29/2016 2030 Full Code [id number]  Doreatha Lew, MD Inpatient   04/20/2016 1809 04/23/2016 1937 Full Code [id number]  Hosie Poisson, MD Inpatient   04/06/2016 1350 04/09/2016 1829 Full Code [id number]  Elgergawy, Silver Huguenin, MD ED   03/23/2016 0122 03/26/2016 2101 Full Code [id number]  Vianne Bulls, MD ED   03/18/2016 1559 03/20/2016  1741 Full Code [id number]  Mariel Aloe, MD Inpatient   02/19/2016 1416 02/21/2016 1732 Full Code [id number]  Caren Griffins, MD ED   01/24/2016 0701 01/26/2016 2001 Full Code [id number]  Karmen Bongo, MD Inpatient   01/19/2016 1423 01/21/2016 1738 Full Code [id number]  Thurnell Lose, MD ED   11/27/2015 2201 12/01/2015 1839 Full Code [id number]  Toy Baker, MD Inpatient   11/14/2015 0405 11/16/2015 1931 Full Code [id number]  Ivor Costa, MD ED   09/02/2015 1233 09/05/2015 2212 Full Code [id number]  Kelvin Cellar, MD ED   07/29/2015 0755 08/01/2015 2303 Full Code [id number]  Reyne Dumas, MD ED   06/29/2015 2027 07/03/2015 1758 Full Code [id number]  Robbie Lis, MD Inpatient   06/22/2015 1058 06/25/2015 1847 Full Code [id number]  Radene Gunning, NP Inpatient   06/14/2015 1829 06/20/2015 2140 Full Code [id number]  Domenic Polite, MD Inpatient   05/26/2015 2202 06/05/2015 1929 Full Code [id number]  Reubin Milan, MD Inpatient   05/23/2015 0717 05/24/2015 1819 Full Code [id number]  Norval Morton, MD Inpatient   05/17/2015 1221 05/20/2015 1813 Full Code [id number]  Eugenie Filler, MD Inpatient   05/17/2015 1109 05/17/2015 1221 Full Code [id number]  Eugenie Filler, MD ED   04/19/2015 0238 04/20/2015 2229 Full Code [id number]  Rise Patience, MD Inpatient   04/05/2015 0543 04/09/2015 1925 Full Code [id number]  Rise Patience, MD  Inpatient   04/05/2015 0537 04/05/2015 0543 Full Code [id number]  Despina Hick, RN Inpatient   03/09/2015 0742 03/12/2015 1911 Full Code [id number]  Theodis Blaze, MD Inpatient   02/20/2015 0108 02/22/2015 1908 Full Code [id number]  Toy Baker, MD Inpatient   02/08/2015 2343 02/13/2015 1532 Full Code [id number]  Lavina Hamman, MD ED   10/21/2014 0057 10/23/2014 1514 Full Code [id number]  Rise Patience, MD Inpatient   02/12/2013 2316 02/17/2013 2122 Full Code [id number]  Quintella Baton, MD Inpatient   11/25/2012 1348 11/28/2012 1904 Full Code [id number]   Elmarie Shiley, MD ED   11/05/2012 1737 11/07/2012 1620 Full Code [id number]  Sheila Oats, MD Inpatient   04/03/2012 0227 04/05/2012 1643 Full Code [id number]  Quintella Baton, MD Inpatient   03/24/2012 0021 03/28/2012 1751 Full Code [id number]  Earline Mayotte II, RN ED      Home/SNF/Other Home  Chief Complaint emesis   Level of Care/Admitting Diagnosis ED Disposition    ED Disposition Condition [facility]: [facility] [[id number]]  Level of Care: Stepdown [14]  Admit to SDU based on following criteria: Hemodynamic compromise or significant risk of instability:  Patient requiring short term acute titration and management of vasoactive drips, and invasive monitoring (i.e., CVP and Arterial line).  Diagnosis: DKA (diabetic ketoacidoses) [facility]) [id number]  Admitting Physician: Georgette Shell [[id number]]  Attending Physician: Georgette Shell [phone]  PT Class (Do Not Modify): Observation [104]  PT Acc Code (Do Not Modify): Observation [10022]       Medical History Past Medical History:  Diagnosis Date  . Depression   . Diabetes mellitus without complication ([place])   . Essential hypertension   . Gastroparesis   . GERD (gastroesophageal reflux disease)   . HLD (hyperlipidemia)     Allergies Allergies  Allergen Reactions  . Anesthetics, Amide Nausea And Vomiting  . Penicillins Diarrhea, Nausea And Vomiting and Other (See Comments)    Has patient had a PCN reaction causing immediate rash, facial/tongue/throat swelling, SOB or lightheadedness with hypotension: Yes Has patient had a PCN reaction causing severe rash involving mucus membranes or skin necrosis: No Has patient had a PCN reaction that required hospitalization No Has patient had a PCN reaction occurring within the last 10 years: Yes  If all of the above answers are "NO", then may proceed with Cephalosporin use.   . Buprenorphine Hcl Rash  . Encainide Nausea And  Vomiting  . Metoclopramide Other (See Comments)    Dystonia, muscle rigidity.     IV Location/Drains/Wounds Patient Lines/Drains/Airways Status   Active Line/Drains/Airways    Name:   Placement date:   Placement time:   Site:   Days:   Peripheral IV 12/10/17 Right Forearm   12/10/17    1444    Forearm   less than 1          Labs/Imaging Results for orders placed or performed during the hospital encounter of 12/10/17 (from the past 48 hour(s))  CBG monitoring, ED     Status: Abnormal   Collection Time: 12/10/17  1:29 PM  Result Value Ref Range   Glucose-Capillary >600 (HH) 65 - 99 mg/dL  Lipase, blood     Status: None   Collection Time: 12/10/17  2:04 PM  Result Value Ref Range   Lipase 26 11 - 51 U/L    Comment: Performed at [facility], [place] [address] [place], [place] [address]  Comprehensive  metabolic panel     Status: Abnormal   Collection Time: 12/10/17  2:04 PM  Result Value Ref Range   Sodium 135 135 - 145 mmol/L    Comment: REPEATED TO VERIFY   Potassium 5.7 (H) 3.5 - 5.1 mmol/L    Comment: REPEATED TO VERIFY   Chloride 100 (L) 101 - 111 mmol/L    Comment: REPEATED TO VERIFY   CO2 9 (L) 22 - 32 mmol/L    Comment: REPEATED TO VERIFY   Glucose, Bld 758 (HH) 65 - 99 mg/dL    Comment: CRITICAL RESULT CALLED TO, READ BACK BY AND VERIFIED WITH: ZULETA,K. RN '@1539'$  ON 06.18.19 BY COHEN,K    BUN 23 (H) 6 - 20 mg/dL   Creatinine, Ser 1.66 (H) 0.44 - 1.00 mg/dL   Calcium 9.6 8.9 - 10.3 mg/dL    Comment: REPEATED TO VERIFY   Total Protein 8.2 (H) 6.5 - 8.1 g/dL   Albumin 4.4 3.5 - 5.0 g/dL   AST 29 15 - 41 U/L   ALT 23 14 - 54 U/L   Alkaline Phosphatase 104 38 - 126 U/L   Total Bilirubin 1.4 (H) 0.3 - 1.2 mg/dL   GFR calc non Af Amer 35 (L) >60 mL/min   GFR calc Af Amer 41 (L) >60 mL/min    Comment: (NOTE) The eGFR has been calculated using the CKD EPI equation. This calculation has not been validated in all clinical situations. eGFR's  persistently <60 mL/min signify possible Chronic Kidney Disease.    Anion gap 26 (H) 5 - 15    Comment: REPEATED TO VERIFY Performed at [facility], [place] [address], [place], [place] [address]   CBC     Status: Abnormal   Collection Time: 12/10/17  2:04 PM  Result Value Ref Range   WBC 19.5 (H) 4.0 - 10.5 K/uL   RBC 4.52 3.87 - 5.11 MIL/uL   Hemoglobin 12.4 12.0 - 15.0 g/dL   HCT 39.9 36.0 - 46.0 %   MCV 88.3 78.0 - 100.0 fL   MCH 27.4 26.0 - 34.0 pg   MCHC 31.1 30.0 - 36.0 g/dL   RDW 17.0 (H) 11.5 - 15.5 %   Platelets 345 150 - 400 K/uL    Comment: Performed at [facility], [place] [address], [place], [place] [address]  I-Stat beta hCG blood, ED     Status: None   Collection Time: 12/10/17  2:38 PM  Result Value Ref Range   I-stat hCG, quantitative <5.0 <5 mIU/mL   Comment 3            Comment:   GEST. AGE      CONC.  (mIU/mL)   <=1 WEEK        5 - 50     2 WEEKS       50 - 500     3 WEEKS       100 - 10,000     4 WEEKS     1,000 - 30,000        FEMALE AND NON-PREGNANT FEMALE:     LESS THAN 5 mIU/mL   CBG monitoring, ED     Status: Abnormal   Collection Time: 12/10/17  2:52 PM  Result Value Ref Range   Glucose-Capillary >600 (HH) 65 - 99 mg/dL  Urinalysis, Routine w reflex microscopic     Status: Abnormal   Collection Time: 12/10/17  3:38 PM  Result Value Ref Range   Color,  Urine STRAW (A) YELLOW   APPearance CLEAR CLEAR   Specific Gravity, Urine 1.020 1.005 - 1.030   pH 5.0 5.0 - 8.0   Glucose, UA >=500 (A) NEGATIVE mg/dL   Hgb urine dipstick LARGE (A) NEGATIVE   Bilirubin Urine NEGATIVE NEGATIVE   Ketones, ur 80 (A) NEGATIVE mg/dL   Protein, ur 30 (A) NEGATIVE mg/dL   Nitrite NEGATIVE NEGATIVE   Leukocytes, UA NEGATIVE NEGATIVE   RBC / HPF >50 (H) 0 - 5 RBC/hpf   WBC, UA 0-5 0 - 5 WBC/hpf   Bacteria, UA NONE SEEN NONE SEEN   Squamous Epithelial / LPF 0-5 0 - 5    Comment: Performed at [facility], [place]  [address] [place], [place] [address]  CBG monitoring, ED     Status: Abnormal   Collection Time: 12/10/17  4:03 PM  Result Value Ref Range   Glucose-Capillary 576 (HH) 65 - 99 mg/dL  Blood gas, arterial (WL & AP ONLY)     Status: Abnormal   Collection Time: 12/10/17  4:19 PM  Result Value Ref Range   FIO2 0.21    pH, Arterial 7.235 (L) 7.350 - 7.450   pCO2 arterial  32.0 - 48.0 mmHg    CRITICAL RESULT CALLED TO, READ BACK BY AND VERIFIED WITH:    Comment: BELOW REPORTABLE RANGE LOGAN VAUGHN RN BY ROBIN POWELL RRT AT 1621 ON 12/10/17   pO2, Arterial 121 (H) 83.0 - 108.0 mmHg   O2 Saturation 97.2 %   Patient temperature 98.6    Collection site RIGHT RADIAL    Drawn by [id number]    Sample type ARTERIAL DRAW    Allens test (pass/fail) PASS PASS    Comment: Performed at [facility], [place] [address] [place], [place] [address]  CBG monitoring, ED     Status: Abnormal   Collection Time: 12/10/17  5:12 PM  Result Value Ref Range   Glucose-Capillary 517 (HH) 65 - 99 mg/dL  CBG monitoring, ED     Status: Abnormal   Collection Time: 12/10/17  6:15 PM  Result Value Ref Range   Glucose-Capillary 377 (H) 65 - 99 mg/dL   Dg Chest 1 View  Result Date: 12/10/2017 CLINICAL DATA:  52 year old female with abdominal pain and vomiting. Diabetic ketoacidosis. Shortness of breath. EXAM: CHEST  1 VIEW COMPARISON:  Chest and abdominal radiographs 11/22/2017 and earlier. FINDINGS: Portable AP semi upright view at 1811 hours. Lung volumes and mediastinal contours remain normal. Allowing for portable technique the lungs are clear. Visualized tracheal air column is within normal limits. Negative visible bowel gas pattern. No osseous abnormality identified. IMPRESSION: Negative.  No acute cardiopulmonary abnormality. Electronically Signed   By: Genevie Ann M.D.   On: 12/10/2017 18:34    Pending Labs Unresulted Labs (From admission, onward)   Start     Ordered   12/11/17 0500  CBC  Tomorrow morning,    R     12/10/17 1815   12/10/17 9024  Basic metabolic panel  STAT Now then every 4 hours ,   STAT     12/10/17 1813   12/10/17 1438  Urine culture  Once,   R     12/10/17 1438      Vitals/Pain Today's Vitals   12/10/17 1730 12/10/17 1745 12/10/17 1800 12/10/17 1830  BP: (!) 159/61  (!) 153/58 (!) 162/73  Pulse: (!) 110 (!) 124 (!) 105 68  Resp: (!) 31 (!) 24 (!) 24 19  Temp:      TempSrc:      SpO2: 100% 100% 100% 100%  Weight:      Height:        Isolation Precautions No active isolations  Medications Medications  insulin regular (NOVOLIN R,HUMULIN R) 100 Units in sodium chloride 0.9 % 100 mL (1 Units/mL) infusion (9.5 Units/hr Intravenous Rate/Dose Change 12/10/17 1817)  0.9 %  sodium chloride infusion ( Intravenous New Bag/Given 12/10/17 1537)  dextrose 5 %-0.45 % sodium chloride infusion (has no administration in time range)  0.9 %  sodium chloride infusion (has no administration in time range)  0.9 %  sodium chloride infusion (has no administration in time range)  dextrose 5 %-0.45 % sodium chloride infusion (has no administration in time range)  potassium chloride 10 mEq in 100 mL IVPB (has no administration in time range)  ondansetron (ZOFRAN) injection 4 mg (has no administration in time range)  famotidine (PEPCID) IVPB 20 mg premix (has no administration in time range)  enoxaparin (LOVENOX) injection 40 mg (has no administration in time range)  LORazepam (ATIVAN) injection 1 mg (1 mg Intramuscular Given 12/10/17 1431)  sodium chloride 0.9 % bolus 1,000 mL (0 mLs Intravenous Stopped 12/10/17 1604)  ondansetron (ZOFRAN) injection 4 mg (4 mg Intravenous Given 12/10/17 1802)    Mobility walks with person assist

## 2017-12-10 NOTE — ED Notes (Signed)
Pt vomited 150cc.  PA made aware.  Pt cleaned, and new linen put on bed

## 2017-12-10 NOTE — ED Notes (Signed)
Pure wick placed on patient. Patient aware we need urine sample.

## 2017-12-10 NOTE — H&P (Signed)
History and Physical    [name] QBH:[id number] DOB: 1966-04-15 DOA: 12/10/2017  PCP: [name], MD Patient coming from: Home  Chief Complaint: Nausea vomiting and high blood sugar  HPI: [name] is a 52 y.o. female with medical history significant of ABDs with multiple admissions for DKA admitted with DKA again today.  Patient has history of gastroparesis and has not been taking her insulin since yesterday.  Her last admission was in 10/1998 with the same.  She has had 6 admissions in the last 6 months.  Patient had persistent nausea and vomiting due to gastroparesis flare along with diffuse crampy abdominal pain she denies any chest pain shortness of breath cough fever or chills.    ED Course: Her blood sugar was 758 upon admission with a gap of 26 BUN of 23 creatinine 1.66 sodium 135 potassium 5.7 lipase of 26 white count of 19.5 hemoglobin 12.4 platelet count 345.  She was started on insulin drip and IV fluids.  Her ABG came back with a pH of 7.23 it was clear negative leukocytes and negative nitrites.  No chest x-ray obtained.  Review of Systems: As per HPI otherwise all other systems reviewed and are negative  Ambulatory Status: Patient is ambulatory at baseline.  Past Medical History:  Diagnosis Date  . Depression   . Diabetes mellitus without complication ([place])   . Essential hypertension   . Gastroparesis   . GERD (gastroesophageal reflux disease)   . HLD (hyperlipidemia)     Past Surgical History:  Procedure Laterality Date  . COLONOSCOPY  09/27/2014   at [facility]  . ESOPHAGOGASTRODUODENOSCOPY  09/27/2014   at [facility], Dr Rolan Lipa. biospy neg for celiac, neg for H pylori.   . EYE SURGERY    . gailstones    . IR FLUORO GUIDE CV LINE RIGHT  02/01/2017  . IR FLUORO GUIDE CV LINE RIGHT  03/06/2017  . IR FLUORO GUIDE CV LINE RIGHT  03/25/2017  . IR GENERIC HISTORICAL  01/24/2016   IR FLUORO GUIDE CV LINE RIGHT 01/24/2016 Darrell K Allred, PA-C [facility]  . IR  GENERIC HISTORICAL  01/24/2016   IR US GUIDE VASC ACCESS RIGHT 01/24/2016 Darrell K Allred, PA-C [facility]  . IR US GUIDE VASC ACCESS RIGHT  02/01/2017  . IR US GUIDE VASC ACCESS RIGHT  03/06/2017  . IR US GUIDE VASC ACCESS RIGHT  03/25/2017  . POSTERIOR VITRECTOMY AND MEMBRANE PEEL-LEFT EYE  09/28/2002  . POSTERIOR VITRECTOMY AND MEMBRANE PEEL-RIGHT EYE  03/16/2002  . RETINAL DETACHMENT SURGERY      Social History   Socioeconomic History  . Marital status: Single    Spouse name: Not on file  . Number of children: 1  . Years of education: Not on file  . Highest education level: Not on file  Occupational History  . Occupation: Unemployed  Social Needs  . Financial resource strain: Not on file  . Food insecurity:    Worry: Not on file    Inability: Not on file  . Transportation needs:    Medical: Not on file    Non-medical: Not on file  Tobacco Use  . Smoking status: Never Smoker  . Smokeless tobacco: Never Used  Substance and Sexual Activity  . Alcohol use: No  . Drug use: No  . Sexual activity: Not Currently  Lifestyle  . Physical activity:    Days per week: Not on file    Minutes per session: Not on file  . Stress: Not  on file  Relationships  . Social connections:    Talks on phone: Not on file    Gets together: Not on file    Attends religious service: Not on file    Active member of club or organization: Not on file    Attends meetings of clubs or organizations: Not on file    Relationship status: Not on file  . Intimate partner violence:    Fear of current or ex partner: Not on file    Emotionally abused: Not on file    Physically abused: Not on file    Forced sexual activity: Not on file  Other Topics Concern  . Not on file  Social History Narrative  . Not on file    Allergies  Allergen Reactions  . Anesthetics, Amide Nausea And Vomiting  . Penicillins Diarrhea, Nausea And Vomiting and Other (See Comments)    Has patient had a PCN reaction causing  immediate rash, facial/tongue/throat swelling, SOB or lightheadedness with hypotension: Yes Has patient had a PCN reaction causing severe rash involving mucus membranes or skin necrosis: No Has patient had a PCN reaction that required hospitalization No Has patient had a PCN reaction occurring within the last 10 years: Yes  If all of the above answers are "NO", then may proceed with Cephalosporin use.   . Buprenorphine Hcl Rash  . Encainide Nausea And Vomiting  . Metoclopramide Other (See Comments)    Dystonia, muscle rigidity.     Family History  Problem Relation Age of Onset  . Cystic fibrosis Mother   . Hypertension Father   . Diabetes Brother   . Hypertension Maternal Grandmother      Prior to Admission medications   Medication Sig Start Date End Date Taking? Authorizing Provider  bisacodyl (DULCOLAX) 5 MG EC tablet Take 5 mg by mouth daily as needed for moderate constipation.   Yes [provider]  busPIRone (BUSPAR) 10 MG tablet Take 1 tablet (10 mg total) by mouth 3 (three) times daily. 11/28/17  Yes [name], MD  clonazePAM (KLONOPIN) 0.5 MG disintegrating tablet Take 1 tablet (0.5 mg total) by mouth 2 (two) times daily. 04/15/17  Yes Gerlene Fee, NP  fluticasone (FLONASE) 50 MCG/ACT nasal spray Place 2 sprays into both nostrils daily. 11/12/17  Yes Eugenie Filler, MD  furosemide (LASIX) 20 MG tablet Take 1 tablet (20 mg total) by mouth daily as needed for fluid or edema. 11/28/17  Yes Newlin, [name], MD  insulin aspart (NOVOLOG) 100 UNIT/ML injection Inject 0-12 Units into the skin 4 (four) times daily -  before meals and at bedtime. Uses a sliding scale: 70-119 = 0 units, 120-150 = 2 units, 151-180 =  4 units, 181-210 = 5 units, 211-240 = 6 units, 241-270 = 8 units, 271-300 = 9 units, 301-330 = 10 units, 331-360 = 11 units, 361-400 = 12 units, greater than 400 notify MD.   Yes [provider]  insulin detemir (LEVEMIR) 100 UNIT/ML injection Inject  0.15 mLs (15 Units total) into the skin every morning. 11/28/17  Yes [name], MD  loratadine (CLARITIN) 10 MG tablet Take 1 tablet (10 mg total) by mouth daily. 11/12/17  Yes Eugenie Filler, MD  mirtazapine (REMERON) 15 MG tablet Take 1 tablet (15 mg total) by mouth at bedtime. 11/28/17  Yes Newlin, [name], MD  ondansetron (ZOFRAN-ODT) 8 MG disintegrating tablet Take 1 tablet (8 mg total) by mouth every 8 (eight) hours as needed for nausea or vomiting. 11/28/17  Yes [name], MD  pantoprazole (PROTONIX) 40 MG tablet Take 1 tablet (40 mg total) by mouth daily. 11/28/17  Yes [name], MD  polyethylene glycol (MIRALAX / GLYCOLAX) packet Take 17 g by mouth daily as needed for mild constipation.  04/11/17  Yes [provider]  pregabalin (LYRICA) 150 MG capsule Take 1 capsule (150 mg total) by mouth daily. 10/16/17  Yes [name], MD  pyridostigmine (MESTINON) 60 MG tablet Take 0.5 tablets (30 mg total) by mouth 3 (three) times daily. Take 1/2 with meals 11/11/17  Yes Eugenie Filler, MD  nitrofurantoin, macrocrystal-monohydrate, (MACROBID) 100 MG capsule Take 1 capsule (100 mg total) by mouth 2 (two) times daily. Patient not taking: Reported on 12/10/2017 11/28/17   [name], MD  Syringe/Needle, Disp, (SYRINGE 3CC/22GX1-1/2") 22G X 1-1/2" 3 ML MISC Inject 1,000 mcg into the muscle daily. 11/11/17   Eugenie Filler, MD  vitamin B-12 1000 MCG tablet Take 1 tablet (1,000 mcg total) by mouth daily. Patient not taking: Reported on 12/10/2017 11/28/17   [name], MD    Physical Exam: Vitals:   12/10/17 1500 12/10/17 1530 12/10/17 1606 12/10/17 1630  BP: (!) 165/56 (!) 184/67 (!) 157/60 (!) 153/58  Pulse: (!) 104 (!) 110 (!) 105 (!) 108  Resp: (!) 30 (!) 32 (!) 31 (!) 23  Temp:      TempSrc:      SpO2: 100% 100% 100% 100%  Weight:      Height:         General: Appears restless Eyes:  PERRL, EOMI, normal lids, iris ENT:  grossly normal hearing, lips &  tongue, mmm Neck:  no LAD, masses or thyromegaly Cardiovascular:  RRR, no m/r/g. No LE edema.  Respiratory:  CTA bilaterally, no w/r/r. Normal respiratory effort. Abdomen: soft, ntnd, NABS Skin:  no rash or induration seen on limited exam Musculoskeletal:  grossly normal tone BUE/BLE, good ROM, no bony abnormality Psychiatric:  grossly normal mood and affect, speech fluent and appropriate, AOx3 Neurologic:  CN 2-12 grossly intact, moves all extremities in coordinated fashion, sensation intact  Labs on Admission: I have personally reviewed following labs and imaging studies  CBC: Recent Labs  Lab 12/10/17 1404  WBC 19.5*  HGB 12.4  HCT 39.9  MCV 88.3  PLT 144   Basic Metabolic Panel: Recent Labs  Lab 12/10/17 1404  NA 135  K 5.7*  CL 100*  CO2 9*  GLUCOSE 758*  BUN 23*  CREATININE 1.66*  CALCIUM 9.6   GFR: Estimated Creatinine Clearance: 35.3 mL/min (A) (by C-G formula based on SCr of 1.66 mg/dL (H)). Liver Function Tests: Recent Labs  Lab 12/10/17 1404  AST 29  ALT 23  ALKPHOS 104  BILITOT 1.4*  PROT 8.2*  ALBUMIN 4.4   Recent Labs  Lab 12/10/17 1404  LIPASE 26   No results for input(s): AMMONIA in the last 168 hours. Coagulation Profile: No results for input(s): INR, PROTIME in the last 168 hours. Cardiac Enzymes: No results for input(s): CKTOTAL, CKMB, CKMBINDEX, TROPONINI in the last 168 hours. BNP (last 3 results) No results for input(s): PROBNP in the last 8760 hours. HbA1C: No results for input(s): HGBA1C in the last 72 hours. CBG: Recent Labs  Lab 12/10/17 1329 12/10/17 1452 12/10/17 1603 12/10/17 1712  GLUCAP >600* >600* 576* 517*   Lipid Profile: No results for input(s): CHOL, HDL, LDLCALC, TRIG, CHOLHDL, LDLDIRECT in the last 72 hours. Thyroid Function Tests: No results for input(s): TSH, T4TOTAL, FREET4,  T3FREE, THYROIDAB in the last 72 hours. Anemia Panel: No results for input(s): VITAMINB12, FOLATE, FERRITIN, TIBC, IRON,  RETICCTPCT in the last 72 hours. Urine analysis:    Component Value Date/Time   COLORURINE STRAW (A) 12/10/2017 1538   APPEARANCEUR CLEAR 12/10/2017 1538   LABSPEC 1.020 12/10/2017 1538   PHURINE 5.0 12/10/2017 1538   GLUCOSEU >=500 (A) 12/10/2017 1538   HGBUR LARGE (A) 12/10/2017 1538   BILIRUBINUR NEGATIVE 12/10/2017 1538   BILIRUBINUR neg 01/17/2017 1533   KETONESUR 80 (A) 12/10/2017 1538   PROTEINUR 30 (A) 12/10/2017 1538   UROBILINOGEN 0.2 01/17/2017 1533   UROBILINOGEN 0.2 04/27/2015 1608   NITRITE NEGATIVE 12/10/2017 1538   LEUKOCYTESUR NEGATIVE 12/10/2017 1538    Creatinine Clearance: Estimated Creatinine Clearance: 35.3 mL/min (A) (by C-G formula based on SCr of 1.66 mg/dL (H)).  Sepsis Labs: @LABRCNTIP (procalcitonin:4,lacticidven:4) )No results found for this or any previous visit (from the past 240 hour(s)).   Radiological Exams on Admission: No results found.   Assessment/Plan Active Problems:   * No active hospital problems. *   1]DKA-patient stopped taking her insulin yesterday due to abdominal pain nausea and vomiting.  Her last hemoglobin A1c was 10.6 from 4 weeks ago.  Started on IV insulin protocol with IV fluids.  2] AKI secondary to dehydration nausea vomiting.  3] leukocytosis probably secondary to dehydration UA is negative will order chest x-ray.  Follow-up after hydration.  4] history of gastroparesis followed at [facility].  Patient takes Mestinon as an outpatient.  5] chronic anemia secondary to iron deficiency and B12 deficiency continue iron supplements and B12 injections.    DVT prophylaxis: Lovenox Code Status: Full code Family Communication: No family available Disposition Plan: TBD Consults called: None Admission status: Observation   Georgette Shell MD [facility]  If 7PM-7AM, please contact night-coverage [email or web address] Password [facility]  12/10/2017, 5:57 PM

## 2017-12-10 NOTE — ED Notes (Signed)
Bed: WA21 Expected date:  Expected time:  Means of arrival:  Comments: 52 yo vomiting, zofran given

## 2017-12-10 NOTE — ED Notes (Signed)
Potassium 5.7.  Will page hospitalist Jacki Cones about potassium IVPB.

## 2017-12-10 NOTE — ED Provider Notes (Signed)
[facility] DEPT Provider Note   CSN: [id number] Arrival date & time: 12/10/17  1310     History   Chief Complaint Chief Complaint  Patient presents with  . Abdominal Pain  . Emesis    HPI [name] is a 52 y.o. female with history of type 1 diabetes, hypertension, gastroparesis, chronic constipation, anxiety is here for elevated blood glucose and gastroparesis.  She endorses nausea, vomiting, diffuse abdominal pain, dry mouth and generalized pain all over her body.  This began this morning.  Last time she used her insulin was  yesterday.  She denies fevers, cough, hematemesis, diarrhea, dysuria.  She cannot tell me what her insulin regimen is at home.  HPI  Past Medical History:  Diagnosis Date  . Depression   . Diabetes mellitus without complication ([place])   . Essential hypertension   . Gastroparesis   . GERD (gastroesophageal reflux disease)   . HLD (hyperlipidemia)     Patient Active Problem List   Diagnosis Date Noted  . Dehydration   . ARF (acute renal failure) ([place])   . Hyperkalemia 11/05/2017  . Constipation 08/01/2017  . MDD (major depressive disorder), single episode, severe , no psychosis ([place])   . DKA (diabetic ketoacidoses) ([place]) 06/16/2017  . Acute dystonic reaction due to drugs 04/01/2017  . Serotonin syndrome 03/25/2017  . Tardive dyskinesia   . Tachycardia 03/16/2017  . Diarrhea 03/05/2017  . DKA, type 1 ([place]) 03/18/2016  . Noncompliance with treatment plan 03/13/2016  . Essential hypertension 01/24/2016  . Leukocytosis 01/24/2016  . Abnormal ECG 11/27/2015  . HLD (hyperlipidemia)   . GERD (gastroesophageal reflux disease)   . AKI (acute kidney injury) ([place])   . Anemia, iron deficiency   . Vitamin B12 deficiency 08/16/2015  . Diabetic gastroparesis ([place])   . Diabetic neuropathy, type I diabetes mellitus ([place]) 05/18/2015  . Anxiety and depression 05/18/2015  . Nausea & vomiting     Past Surgical History:    Procedure Laterality Date  . COLONOSCOPY  09/27/2014   at [facility]  . ESOPHAGOGASTRODUODENOSCOPY  09/27/2014   at [facility], Dr Rolan Lipa. biospy neg for celiac, neg for H pylori.   . EYE SURGERY    . gailstones    . IR FLUORO GUIDE CV LINE RIGHT  02/01/2017  . IR FLUORO GUIDE CV LINE RIGHT  03/06/2017  . IR FLUORO GUIDE CV LINE RIGHT  03/25/2017  . IR GENERIC HISTORICAL  01/24/2016   IR FLUORO GUIDE CV LINE RIGHT 01/24/2016 Darrell K Allred, PA-C [facility]  . IR GENERIC HISTORICAL  01/24/2016   IR US GUIDE VASC ACCESS RIGHT 01/24/2016 Darrell K Allred, PA-C [facility]  . IR US GUIDE VASC ACCESS RIGHT  02/01/2017  . IR US GUIDE VASC ACCESS RIGHT  03/06/2017  . IR US GUIDE VASC ACCESS RIGHT  03/25/2017  . POSTERIOR VITRECTOMY AND MEMBRANE PEEL-LEFT EYE  09/28/2002  . POSTERIOR VITRECTOMY AND MEMBRANE PEEL-RIGHT EYE  03/16/2002  . RETINAL DETACHMENT SURGERY       OB History   None      Home Medications    Prior to Admission medications   Medication Sig Start Date End Date Taking? Authorizing Provider  bisacodyl (DULCOLAX) 5 MG EC tablet Take 5 mg by mouth daily as needed for moderate constipation.    [provider]  busPIRone (BUSPAR) 10 MG tablet Take 1 tablet (10 mg total) by mouth 3 (three) times daily. 11/28/17   [name], MD  clonazePAM (  KLONOPIN) 0.5 MG disintegrating tablet Take 1 tablet (0.5 mg total) by mouth 2 (two) times daily. 04/15/17   Gerlene Fee, NP  fluticasone (FLONASE) 50 MCG/ACT nasal spray Place 2 sprays into both nostrils daily. 11/12/17   Eugenie Filler, MD  furosemide (LASIX) 20 MG tablet Take 1 tablet (20 mg total) by mouth daily as needed for fluid or edema. 11/28/17   [name], MD  insulin aspart (NOVOLOG) 100 UNIT/ML injection Inject 0-12 Units into the skin 4 (four) times daily -  before meals and at bedtime. Uses a sliding scale: 70-119 = 0 units, 120-150 = 2 units, 151-180 =  4 units, 181-210 = 5 units, 211-240 = 6 units, 241-270 = 8  units, 271-300 = 9 units, 301-330 = 10 units, 331-360 = 11 units, 361-400 = 12 units, greater than 400 notify MD.    [provider]  insulin detemir (LEVEMIR) 100 UNIT/ML injection Inject 0.15 mLs (15 Units total) into the skin every morning. 11/28/17   [name], MD  loratadine (CLARITIN) 10 MG tablet Take 1 tablet (10 mg total) by mouth daily. 11/12/17   Eugenie Filler, MD  mirtazapine (REMERON) 15 MG tablet Take 1 tablet (15 mg total) by mouth at bedtime. 11/28/17   [name], MD  nitrofurantoin, macrocrystal-monohydrate, (MACROBID) 100 MG capsule Take 1 capsule (100 mg total) by mouth 2 (two) times daily. 11/28/17   [name], MD  ondansetron (ZOFRAN-ODT) 8 MG disintegrating tablet Take 1 tablet (8 mg total) by mouth every 8 (eight) hours as needed for nausea or vomiting. 11/28/17   [name], MD  pantoprazole (PROTONIX) 40 MG tablet Take 1 tablet (40 mg total) by mouth daily. 11/28/17   [name], MD  polyethylene glycol (MIRALAX / GLYCOLAX) packet Take 17 g by mouth daily as needed for mild constipation.  04/11/17   [provider]  pregabalin (LYRICA) 150 MG capsule Take 1 capsule (150 mg total) by mouth daily. 10/16/17   [name], MD  pyridostigmine (MESTINON) 60 MG tablet Take 0.5 tablets (30 mg total) by mouth 3 (three) times daily. Take 1/2 with meals 11/11/17   Eugenie Filler, MD  Syringe/Needle, Disp, (SYRINGE 3CC/22GX1-1/2") 22G X 1-1/2" 3 ML MISC Inject 1,000 mcg into the muscle daily. 11/11/17   Eugenie Filler, MD  vitamin B-12 1000 MCG tablet Take 1 tablet (1,000 mcg total) by mouth daily. 11/28/17   [name], MD    Family History Family History  Problem Relation Age of Onset  . Cystic fibrosis Mother   . Hypertension Father   . Diabetes Brother   . Hypertension Maternal Grandmother     Social History Social History   Tobacco Use  . Smoking status: Never Smoker  . Smokeless tobacco: Never Used  Substance Use  Topics  . Alcohol use: No  . Drug use: No     Allergies   Anesthetics, amide; Penicillins; Buprenorphine hcl; Encainide; and Metoclopramide   Review of Systems Review of Systems  Gastrointestinal: Positive for abdominal pain, nausea and vomiting.  Allergic/Immunologic: Positive for immunocompromised state.  All other systems reviewed and are negative.    Physical Exam Updated Vital Signs BP (!) 157/60   Pulse (!) 105   Temp (!) 97.3 F (36.3 C) (Oral)   Resp (!) 31   Ht 5' 0.5" (1.537 m)   Wt 68 kg (150 lb)   LMP  (LMP Unknown)   SpO2 100%   BMI 28.81 kg/m   Physical  Exam  Constitutional: She is oriented to person, place, and time. She appears well-developed and well-nourished. She appears distressed.  Mentating well.   HENT:  Head: Normocephalic and atraumatic.  Nose: Nose normal.  Dry lips and mucous membranes  Eyes: Pupils are equal, round, and reactive to light. Conjunctivae and EOM are normal.  Neck: Normal range of motion.  Cardiovascular: Regular rhythm and intact distal pulses.  Tachycardic.  2+ DP and radial pulses bilaterally. No LE edema.   Pulmonary/Chest: Effort normal and breath sounds normal.  Speaking in full sentences.  Kussmaul respirations noted.  Abdominal: Soft. Bowel sounds are normal. There is generalized tenderness.  No suprapubic or CVA tenderness. Negative Murphy's and McBurney's.   Musculoskeletal: Normal range of motion.  Neurological: She is alert and oriented to person, place, and time.  Skin: Skin is warm. Capillary refill takes less than 2 seconds. She is diaphoretic.  Psychiatric: She has a normal mood and affect. Her behavior is normal.  Nursing note and vitals reviewed.    ED Treatments / Results  Labs (all labs ordered are listed, but only abnormal results are displayed) Labs Reviewed  COMPREHENSIVE METABOLIC PANEL - Abnormal; Notable for the following components:      Result Value   Potassium 5.7 (*)    Chloride 100  (*)    CO2 9 (*)    Glucose, Bld 758 (*)    BUN 23 (*)    Creatinine, Ser 1.66 (*)    Total Protein 8.2 (*)    Total Bilirubin 1.4 (*)    GFR calc non Af Amer 35 (*)    GFR calc Af Amer 41 (*)    Anion gap 26 (*)    All other components within normal limits  CBC - Abnormal; Notable for the following components:   WBC 19.5 (*)    RDW 17.0 (*)    All other components within normal limits  CBG MONITORING, ED - Abnormal; Notable for the following components:   Glucose-Capillary >600 (*)    All other components within normal limits  CBG MONITORING, ED - Abnormal; Notable for the following components:   Glucose-Capillary >600 (*)    All other components within normal limits  CBG MONITORING, ED - Abnormal; Notable for the following components:   Glucose-Capillary 576 (*)    All other components within normal limits  URINE CULTURE  LIPASE, BLOOD  URINALYSIS, ROUTINE W REFLEX MICROSCOPIC  BLOOD GAS, ARTERIAL  I-STAT BETA HCG BLOOD, ED ([facility], [facility], [facility] ONLY)    EKG EKG Interpretation  Date/Time:  Tuesday December 10 2017 15:51:28 EDT Ventricular Rate:  110 PR Interval:    QRS Duration: 93 QT Interval:  349 QTC Calculation: 473 R Axis:   176 Text Interpretation:  Right and left arm electrode reversal, interpretation assumes no reversal Sinus tachycardia Right axis deviation Abnormal T, consider ischemia, lateral leads No significant change since last tracing Confirmed by Isla Pence [phone]) on 12/10/2017 3:54:53 PM   Radiology No results found.  Procedures .Critical Care Performed by: Kinnie Feil, PA-C Authorized by: Kinnie Feil, PA-C   Critical care provider statement:    Critical care time (minutes):  35   Critical care was necessary to treat or prevent imminent or life-threatening deterioration of the following conditions:  Endocrine crisis (DKA)   Critical care was time spent personally by me on the following activities:  Blood draw for specimens, evaluation of  patient's response to treatment, examination of patient, obtaining history from patient  or surrogate, review of old charts, re-evaluation of patient's condition and ordering and review of laboratory studies   I assumed direction of critical care for this patient from another provider in my specialty: no     (including critical care time)  Medications Ordered in ED Medications  insulin regular (NOVOLIN R,HUMULIN R) 100 Units in sodium chloride 0.9 % 100 mL (1 Units/mL) infusion (10.3 Units/hr Intravenous Rate/Dose Change 12/10/17 1605)  0.9 %  sodium chloride infusion ( Intravenous New Bag/Given 12/10/17 1537)  dextrose 5 %-0.45 % sodium chloride infusion (has no administration in time range)  LORazepam (ATIVAN) injection 1 mg (1 mg Intramuscular Given 12/10/17 1431)  sodium chloride 0.9 % bolus 1,000 mL (0 mLs Intravenous Stopped 12/10/17 1604)     Initial Impression / Assessment and Plan / ED Course  I have reviewed the triage vital signs and the nursing notes.  Pertinent labs & imaging results that were available during my care of the patient were reviewed by me and considered in my medical decision making (see chart for details).  Clinical Course as of Dec 11 1626  Tue Dec 10, 2017  1541 Creatinine(!): 1.66 [CG]  1541 GFR, Est African American(!): 41 [CG]  1541 WBC(!): 19.5 [CG]  1550 Glucose(!!): 758 [CG]  1551 Potassium(!): 5.7 [CG]  1551 GFR, Est African American(!): 41 [CG]  1551 Anion gap(!): 26 [CG]    Clinical Course User Index [CG] Kinnie Feil, PA-C    52 year old here with hyperglycemia.  Last insulin dose 2 days ago.  On exam she is diaphoretic, tachycardic, Kussmaul breathing noted.  Afebrile.  Mentating well. She has nonfocal abdominal tenderness.  History of gastroparesis.  Sounds like gastroparesis flare precipitated hyperglycemia, she was unable to eat due to nausea and vomiting and has not self-administered her insulin.  Lab work remarkable for  hyperglycemia 758, anion gap 26, potassium 5.7, bicarb 9.  WBC 19.5, she does not have any infectious symptoms other than her chronic nausea, vomiting, abdominal pain from gastroparesis and this is most likely stress response.  Urinalysis is pending.  Her lungs are clear and she denies recent cough, low suspicion for pneumonia.  She was started on ED glucose stabilizer, antiemetics.  I reevaluated patient after insulin was started and she feels only slightly better.  Will request admission for DKA.  Final Clinical Impressions(s) / ED Diagnoses   1627: Discussed with Dr Zigmund Daniel who will admit.  Final diagnoses:  Diabetic ketoacidosis without coma associated with type 1 diabetes mellitus [facility])    ED Discharge Orders    None       Arlean Hopping 12/10/17 1628    Isla Pence, MD 12/11/17 0730

## 2017-12-10 NOTE — ED Notes (Signed)
Difficult IV start, 2 RN's and PA attempted U/S IV.  U/S IV successful by [facility] RN after 4th stick.

## 2017-12-11 ENCOUNTER — Other Ambulatory Visit: Payer: Self-pay

## 2017-12-11 DIAGNOSIS — T383X6A Underdosing of insulin and oral hypoglycemic [antidiabetic] drugs, initial encounter: Secondary | ICD-10-CM | POA: Diagnosis present

## 2017-12-11 DIAGNOSIS — K3184 Gastroparesis: Secondary | ICD-10-CM | POA: Diagnosis present

## 2017-12-11 DIAGNOSIS — N179 Acute kidney failure, unspecified: Secondary | ICD-10-CM | POA: Diagnosis present

## 2017-12-11 DIAGNOSIS — E860 Dehydration: Secondary | ICD-10-CM | POA: Diagnosis present

## 2017-12-11 DIAGNOSIS — D72829 Elevated white blood cell count, unspecified: Secondary | ICD-10-CM | POA: Diagnosis present

## 2017-12-11 DIAGNOSIS — K5909 Other constipation: Secondary | ICD-10-CM | POA: Diagnosis present

## 2017-12-11 DIAGNOSIS — Z885 Allergy status to narcotic agent status: Secondary | ICD-10-CM | POA: Diagnosis not present

## 2017-12-11 DIAGNOSIS — F329 Major depressive disorder, single episode, unspecified: Secondary | ICD-10-CM | POA: Diagnosis present

## 2017-12-11 DIAGNOSIS — E1310 Other specified diabetes mellitus with ketoacidosis without coma: Secondary | ICD-10-CM | POA: Diagnosis not present

## 2017-12-11 DIAGNOSIS — F419 Anxiety disorder, unspecified: Secondary | ICD-10-CM | POA: Diagnosis present

## 2017-12-11 DIAGNOSIS — E1010 Type 1 diabetes mellitus with ketoacidosis without coma: Secondary | ICD-10-CM | POA: Diagnosis not present

## 2017-12-11 DIAGNOSIS — D509 Iron deficiency anemia, unspecified: Secondary | ICD-10-CM | POA: Diagnosis present

## 2017-12-11 DIAGNOSIS — K219 Gastro-esophageal reflux disease without esophagitis: Secondary | ICD-10-CM | POA: Diagnosis not present

## 2017-12-11 DIAGNOSIS — E1043 Type 1 diabetes mellitus with diabetic autonomic (poly)neuropathy: Secondary | ICD-10-CM | POA: Diagnosis present

## 2017-12-11 DIAGNOSIS — Z79899 Other long term (current) drug therapy: Secondary | ICD-10-CM | POA: Diagnosis not present

## 2017-12-11 DIAGNOSIS — F439 Reaction to severe stress, unspecified: Secondary | ICD-10-CM | POA: Diagnosis present

## 2017-12-11 DIAGNOSIS — I10 Essential (primary) hypertension: Secondary | ICD-10-CM | POA: Diagnosis present

## 2017-12-11 DIAGNOSIS — Z452 Encounter for adjustment and management of vascular access device: Secondary | ICD-10-CM | POA: Diagnosis not present

## 2017-12-11 DIAGNOSIS — Z884 Allergy status to anesthetic agent status: Secondary | ICD-10-CM | POA: Diagnosis not present

## 2017-12-11 DIAGNOSIS — Z880 Allergy status to penicillin: Secondary | ICD-10-CM | POA: Diagnosis not present

## 2017-12-11 DIAGNOSIS — E538 Deficiency of other specified B group vitamins: Secondary | ICD-10-CM | POA: Diagnosis present

## 2017-12-11 DIAGNOSIS — Z9119 Patient's noncompliance with other medical treatment and regimen: Secondary | ICD-10-CM | POA: Diagnosis not present

## 2017-12-11 DIAGNOSIS — Z888 Allergy status to other drugs, medicaments and biological substances status: Secondary | ICD-10-CM | POA: Diagnosis not present

## 2017-12-11 DIAGNOSIS — R402413 Glasgow coma scale score 13-15, at hospital admission: Secondary | ICD-10-CM | POA: Diagnosis present

## 2017-12-11 LAB — BASIC METABOLIC PANEL
ANION GAP: 5 (ref 5–15)
ANION GAP: 6 (ref 5–15)
BUN: 20 mg/dL (ref 6–20)
BUN: 21 mg/dL — ABNORMAL HIGH (ref 6–20)
CALCIUM: 8.5 mg/dL — AB (ref 8.9–10.3)
CALCIUM: 8.3 mg/dL — AB (ref 8.9–10.3)
CO2: 18 mmol/L — ABNORMAL LOW (ref 22–32)
CO2: 17 mmol/L — ABNORMAL LOW (ref 22–32)
Chloride: 120 mmol/L — ABNORMAL HIGH (ref 101–111)
Chloride: 120 mmol/L — ABNORMAL HIGH (ref 101–111)
Creatinine, Ser: 1.4 mg/dL — ABNORMAL HIGH (ref 0.44–1.00)
Creatinine, Ser: 1.29 mg/dL — ABNORMAL HIGH (ref 0.44–1.00)
GFR calc Af Amer: 50 mL/min — ABNORMAL LOW (ref >60)
GFR, EST AFRICAN AMERICAN: 55 mL/min — AB (ref >60)
GFR, EST NON AFRICAN AMERICAN: 43 mL/min — AB (ref >60)
GFR, EST NON AFRICAN AMERICAN: 47 mL/min — AB (ref >60)
Glucose, Bld: 188 mg/dL — ABNORMAL HIGH (ref 65–99)
Glucose, Bld: 123 mg/dL — ABNORMAL HIGH (ref 65–99)
Potassium: 3.8 mmol/L (ref 3.5–5.1)
Potassium: 4 mmol/L (ref 3.5–5.1)
Sodium: 143 mmol/L (ref 135–145)
Sodium: 143 mmol/L (ref 135–145)

## 2017-12-11 LAB — MRSA PCR SCREENING: MRSA BY PCR: NEGATIVE

## 2017-12-11 LAB — URINE CULTURE

## 2017-12-11 LAB — CBC
HEMATOCRIT: 34.3 % — AB (ref 36.0–46.0)
HEMOGLOBIN: 11.1 g/dL — AB (ref 12.0–15.0)
MCH: 27.3 pg (ref 26.0–34.0)
MCHC: 32.4 g/dL (ref 30.0–36.0)
MCV: 84.3 fL (ref 78.0–100.0)
Platelets: 274 10*3/uL (ref 150–400)
RBC: 4.07 MIL/uL (ref 3.87–5.11)
RDW: 17.1 % — ABNORMAL HIGH (ref 11.5–15.5)
WBC: 14.5 10*3/uL — AB (ref 4.0–10.5)

## 2017-12-11 LAB — GLUCOSE, CAPILLARY
GLUCOSE-CAPILLARY: 206 mg/dL — AB (ref 65–99)
GLUCOSE-CAPILLARY: 192 mg/dL — AB (ref 65–99)
GLUCOSE-CAPILLARY: 98 mg/dL (ref 65–99)
GLUCOSE-CAPILLARY: 109 mg/dL — AB (ref 65–99)
GLUCOSE-CAPILLARY: 111 mg/dL — AB (ref 65–99)
GLUCOSE-CAPILLARY: 215 mg/dL — AB (ref 65–99)
GLUCOSE-CAPILLARY: 119 mg/dL — AB (ref 65–99)
GLUCOSE-CAPILLARY: 140 mg/dL — AB (ref 65–99)
Glucose-Capillary: 132 mg/dL — ABNORMAL HIGH (ref 65–99)
Glucose-Capillary: 135 mg/dL — ABNORMAL HIGH (ref 65–99)
Glucose-Capillary: 188 mg/dL — ABNORMAL HIGH (ref 65–99)
Glucose-Capillary: 193 mg/dL — ABNORMAL HIGH (ref 65–99)
Glucose-Capillary: 214 mg/dL — ABNORMAL HIGH (ref 65–99)

## 2017-12-11 MED ORDER — INSULIN ASPART 100 UNIT/ML ~~LOC~~ SOLN
3.0000 [IU] | Freq: Three times a day (TID) | SUBCUTANEOUS | Status: DC
  Administered 2017-12-11 – 2017-12-12 (×2): 3 [IU] via SUBCUTANEOUS

## 2017-12-11 MED ORDER — ONDANSETRON HCL 4 MG/2ML IJ SOLN
4.0000 mg | Freq: Four times a day (QID) | INTRAMUSCULAR | Status: DC
  Administered 2017-12-11 – 2017-12-14 (×10): 4 mg via INTRAVENOUS
  Filled 2017-12-11 (×12): qty 2

## 2017-12-11 MED ORDER — INSULIN ASPART 100 UNIT/ML ~~LOC~~ SOLN
0.0000 [IU] | Freq: Three times a day (TID) | SUBCUTANEOUS | Status: DC
  Administered 2017-12-11: 5 [IU] via SUBCUTANEOUS

## 2017-12-11 MED ORDER — INSULIN ASPART 100 UNIT/ML ~~LOC~~ SOLN
3.0000 [IU] | Freq: Three times a day (TID) | SUBCUTANEOUS | Status: DC
  Administered 2017-12-11: 3 [IU] via SUBCUTANEOUS

## 2017-12-11 MED ORDER — INSULIN DETEMIR 100 UNIT/ML ~~LOC~~ SOLN
15.0000 [IU] | SUBCUTANEOUS | Status: DC
  Administered 2017-12-11 – 2017-12-12 (×2): 15 [IU] via SUBCUTANEOUS
  Filled 2017-12-11 (×3): qty 0.15

## 2017-12-11 MED ORDER — INSULIN ASPART 100 UNIT/ML ~~LOC~~ SOLN: 0.0000 [IU] | Freq: Every day | SUBCUTANEOUS | Status: DC

## 2017-12-11 MED ORDER — INSULIN ASPART 100 UNIT/ML ~~LOC~~ SOLN
0.0000 [IU] | Freq: Three times a day (TID) | SUBCUTANEOUS | Status: DC
  Administered 2017-12-12: 7 [IU] via SUBCUTANEOUS
  Administered 2017-12-12 (×2): 2 [IU] via SUBCUTANEOUS

## 2017-12-11 MED ORDER — SODIUM CHLORIDE 0.9 % IV SOLN
INTRAVENOUS | Status: DC
  Administered 2017-12-11: 16:00:00 via INTRAVENOUS
  Administered 2017-12-11: 75 mL/h via INTRAVENOUS
  Administered 2017-12-12 (×2): via INTRAVENOUS

## 2017-12-11 NOTE — Progress Notes (Signed)
Called report to RN on [place], pt will be transferred to med-surg in wheelchair on room air.

## 2017-12-11 NOTE — Progress Notes (Signed)
Inpatient Diabetes Program Recommendations  AACE/ADA: New Consensus Statement on Inpatient Glycemic Control (2015)  Target Ranges:  Prepandial:   less than 140 mg/dL      Peak postprandial:   less than 180 mg/dL (1-2 hours)      Critically ill patients:  140 - 180 mg/dL   Lab Results  Component Value Date   GLUCAP 215 (H) 12/11/2017   HGBA1C 10.6 (H) 11/07/2017    Review of Glycemic Control  Diabetes history: DM 1 Outpatient Diabetes medications: Levemir 15 units QAM, Novolog 0-12 units QID Current orders for Inpatient glycemic control: Levemir 15 units Q24H, Novolog 0-15 units tidwc and hs + 3 units tidwc  7th admission in 6 months. Has Endo appt with Dr. Dwyane Dee on 12/24/2017 HgbA1C - 10.6% - goal - 7%  Inpatient Diabetes Program Recommendations:     Decrease Novolog to 0-9 units tidwc and hs Increase meal coverage to Novolog 4 units tidwc  Spoke with pt about not taking her Levemir, even though she was nauseated and was not taking po's.Stressed importance of taking basal insulin, even when NPO, since pt is Type 1. Pt voiced understanding. Would like to speak with pt's sister if pt gives permission.  Will follow closely.  Thank you. Lorenda Peck, RD, LDN, CDE Inpatient Diabetes Coordinator [phone]

## 2017-12-11 NOTE — Progress Notes (Signed)
[facility] Progress Note  Patient: [name] HFW:[id number]   PCP: [name], MD DOB: 11-29-1965   DOA: 12/10/2017   DOS: 12/11/2017   Date of Service: the patient was seen and examined on 12/11/2017  Subjective: Feeling better, no nausea no vomiting.  No abdominal pain.  No diarrhea.  Brief hospital course: Pt. with PMH of type I DM, gastroparesis; admitted on 12/10/2017, presented with complaint of abdominal pain and nausea as well as noncompliance with insulin, was found to have DKA. Currently further plan is continue current management.  Assessment and Plan: 1.  DKA. Type 1 diabetes mellitus. Noncompliance. Controlled. Associated with diabetic gastroparesis. Recent hemoglobin A1c 10.6. Patient appears to have noncompliance with medical regimen as well as diet regimen. At present anion gap is closed for 3 back-to-back BMP.  Will advance to carb modified diet.  Continue 15 units of Levemir per home regimen. Also advancing to sliding scale insulin. Will use 3 units of AC coverage. Appreciate diabetes collimators consultation. Stressed to the patient the importance of continuous use of insulin despite her not feeling well due to her type I status.  2.  Diabetic gastroparesis. On scheduled Zofran for now. On Mestinon as an outpatient.  3.  Acute kidney injury. Renal function worsened secondary to dehydration on admission. Now with hydration currently getting better and almost close to baseline. Monitor.  4.  Leukocytosis.   likely stress reaction. Monitor.  Diet: Carb modified diet DVT Prophylaxis: subcutaneous Heparin Advance goals of care discussion: full code  Family Communication: no family was present at bedside, at the time of interview.  Disposition:  Discharge to home tomorrow.  Consultants: none Procedures: nonoe  Antibiotics: Anti-infectives (From admission, onward)   None       Objective: Physical Exam: Vitals:   12/11/17 1300  12/11/17 1400 12/11/17 1500 12/11/17 1559  BP: (!) 156/61 (!) 133/44 (!) 144/57 130/61  Pulse: 100 96 95 96  Resp: (!) 33 16 18 20   Temp:    98.3 F (36.8 C)  TempSrc:    Oral  SpO2: 100% 100% 100% 100%  Weight:      Height:        Intake/Output Summary (Last 24 hours) at 12/11/2017 1704 Last data filed at 12/11/2017 1500 Gross per 24 hour  Intake 3672.09 ml  Output 525 ml  Net 3147.09 ml   Filed Weights   12/10/17 1330 12/10/17 2000  Weight: 68 kg (150 lb) 65 kg (143 lb 4.8 oz)   General: Alert, Awake and Oriented to Time, Place and Person. Appear in mild distress, affect appropriate Eyes: PERRL, Conjunctiva normal ENT: Oral Mucosa clear moist. Neck: no JVD, no Abnormal Mass Or lumps Cardiovascular: S1 and S2 Present, no Murmur, Peripheral Pulses Present Respiratory: normal respiratory effort, Bilateral Air entry equal and Decreased, no use of accessory muscle, Clear to Auscultation, no Crackles, no wheezes Abdomen: Bowel Sound p3sent, Soft and p tenderness, no hernia Skin: no redness, no Rash, no induration Extremities: no Pedal edema, no calf tenderness Neurologic: Grossly no focal neuro deficit. Bilaterally Equal motor strength  Data Reviewed: CBC: Recent Labs  Lab 12/10/17 1404 12/11/17 0222  WBC 19.5* 14.5*  HGB 12.4 11.1*  HCT 39.9 34.3*  MCV 88.3 84.3  PLT 345 027   Basic Metabolic Panel: Recent Labs  Lab 12/10/17 1404 12/10/17 1914 12/10/17 2202 12/11/17 0222 12/11/17 0654  NA 135 145 146* 143 143  K 5.7* 5.1 4.3 3.8 4.0  CL 100* 112* 118* 120* 120*  CO2 9* 13* 16* 18* 17*  GLUCOSE 758* 360* 236* 188* 123*  BUN 23* 22* 22* 20 21*  CREATININE 1.66* 1.66* 1.44* 1.40* 1.29*  CALCIUM 9.6 9.9 9.4 8.5* 8.3*    Liver Function Tests: Recent Labs  Lab 12/10/17 1404  AST 29  ALT 23  ALKPHOS 104  BILITOT 1.4*  PROT 8.2*  ALBUMIN 4.4   Recent Labs  Lab 12/10/17 1404  LIPASE 26   No results for input(s): AMMONIA in the last 168  hours. Coagulation Profile: No results for input(s): INR, PROTIME in the last 168 hours. Cardiac Enzymes: No results for input(s): CKTOTAL, CKMB, CKMBINDEX, TROPONINI in the last 168 hours. BNP (last 3 results) No results for input(s): PROBNP in the last 8760 hours. CBG: Recent Labs  Lab 12/11/17 0503 12/11/17 0613 12/11/17 0655 12/11/17 0805 12/11/17 1231  GLUCAP 98 109* 111* 135* 215*   Studies: Dg Chest 1 View  Result Date: 12/10/2017 CLINICAL DATA:  52 year old female with abdominal pain and vomiting. Diabetic ketoacidosis. Shortness of breath. EXAM: CHEST  1 VIEW COMPARISON:  Chest and abdominal radiographs 11/22/2017 and earlier. FINDINGS: Portable AP semi upright view at 1811 hours. Lung volumes and mediastinal contours remain normal. Allowing for portable technique the lungs are clear. Visualized tracheal air column is within normal limits. Negative visible bowel gas pattern. No osseous abnormality identified. IMPRESSION: Negative.  No acute cardiopulmonary abnormality. Electronically Signed   By: Genevie Ann M.D.   On: 12/10/2017 18:34    Scheduled Meds: . enoxaparin (LOVENOX) injection  40 mg Subcutaneous Q24H  . insulin aspart  0-5 Units Subcutaneous QHS  . insulin aspart  0-9 Units Subcutaneous TID WC  . insulin aspart  3 Units Subcutaneous TID WC  . insulin detemir  15 Units Subcutaneous Q24H  . ondansetron (ZOFRAN) IV  4 mg Intravenous Q6H   Continuous Infusions: . sodium chloride 75 mL/hr at 12/11/17 1559  . famotidine (PEPCID) IV Stopped (12/11/17 1018)   PRN Meds: hydrALAZINE, prochlorperazine, zolpidem  Time spent: 35 minutes  Author: Berle Mull, MD [facility] Pager: [phone] 12/11/2017 5:04 PM  If 7PM-7AM, please contact night-coverage at [email or web address], password [facility]

## 2017-12-12 LAB — BASIC METABOLIC PANEL
ANION GAP: 4 — AB (ref 5–15)
BUN: 16 mg/dL (ref 6–20)
CALCIUM: 8.4 mg/dL — AB (ref 8.9–10.3)
CO2: 19 mmol/L — AB (ref 22–32)
Chloride: 115 mmol/L — ABNORMAL HIGH (ref 101–111)
Creatinine, Ser: 1.09 mg/dL — ABNORMAL HIGH (ref 0.44–1.00)
GFR, EST NON AFRICAN AMERICAN: 58 mL/min — AB (ref >60)
GLUCOSE: 295 mg/dL — AB (ref 65–99)
Potassium: 4.1 mmol/L (ref 3.5–5.1)
Sodium: 138 mmol/L (ref 135–145)

## 2017-12-12 LAB — GLUCOSE, CAPILLARY
GLUCOSE-CAPILLARY: 162 mg/dL — AB (ref 65–99)
Glucose-Capillary: 309 mg/dL — ABNORMAL HIGH (ref 65–99)
Glucose-Capillary: 187 mg/dL — ABNORMAL HIGH (ref 65–99)
Glucose-Capillary: 163 mg/dL — ABNORMAL HIGH (ref 65–99)

## 2017-12-12 MED ORDER — BUSPIRONE HCL 10 MG PO TABS
10.0000 mg | ORAL_TABLET | Freq: Three times a day (TID) | ORAL | Status: DC
  Administered 2017-12-12 – 2017-12-15 (×9): 10 mg via ORAL
  Filled 2017-12-12 (×9): qty 1

## 2017-12-12 MED ORDER — PROMETHAZINE HCL 25 MG/ML IJ SOLN
12.5000 mg | Freq: Four times a day (QID) | INTRAMUSCULAR | Status: DC | PRN
  Administered 2017-12-12 (×2): 12.5 mg via INTRAVENOUS
  Filled 2017-12-12 (×2): qty 1

## 2017-12-12 MED ORDER — LORAZEPAM 2 MG/ML IJ SOLN
0.5000 mg | Freq: Once | INTRAMUSCULAR | Status: AC
  Administered 2017-12-12: 0.5 mg via INTRAVENOUS
  Filled 2017-12-12: qty 1

## 2017-12-12 MED ORDER — INSULIN ASPART 100 UNIT/ML ~~LOC~~ SOLN: 5.0000 [IU] | Freq: Three times a day (TID) | SUBCUTANEOUS | 0 refills | Status: DC

## 2017-12-12 MED ORDER — POLYETHYLENE GLYCOL 3350 17 G PO PACK: 17.0000 g | PACK | Freq: Every day | ORAL | Status: DC | PRN

## 2017-12-12 MED ORDER — PREGABALIN 75 MG PO CAPS
150.0000 mg | ORAL_CAPSULE | Freq: Every day | ORAL | Status: DC
  Administered 2017-12-13 – 2017-12-15 (×3): 150 mg via ORAL
  Filled 2017-12-12 (×3): qty 2

## 2017-12-12 MED ORDER — INSULIN ASPART 100 UNIT/ML ~~LOC~~ SOLN: 3.0000 [IU] | Freq: Three times a day (TID) | SUBCUTANEOUS | 0 refills | Status: DC

## 2017-12-12 MED ORDER — PYRIDOSTIGMINE BROMIDE 60 MG PO TABS
30.0000 mg | ORAL_TABLET | Freq: Three times a day (TID) | ORAL | Status: DC
  Administered 2017-12-12 – 2017-12-15 (×10): 30 mg via ORAL
  Filled 2017-12-12 (×11): qty 0.5

## 2017-12-12 MED ORDER — ONDANSETRON 8 MG PO TBDP: 8.0000 mg | ORAL_TABLET | Freq: Three times a day (TID) | ORAL | 0 refills | Status: DC | PRN

## 2017-12-12 MED ORDER — PANTOPRAZOLE SODIUM 40 MG PO TBEC
40.0000 mg | DELAYED_RELEASE_TABLET | Freq: Every day | ORAL | Status: DC
  Administered 2017-12-13 – 2017-12-15 (×3): 40 mg via ORAL
  Filled 2017-12-12 (×3): qty 1

## 2017-12-12 MED ORDER — INSULIN ASPART 100 UNIT/ML ~~LOC~~ SOLN: 5.0000 [IU] | Freq: Three times a day (TID) | SUBCUTANEOUS | Status: DC

## 2017-12-12 MED ORDER — CLONAZEPAM 0.5 MG PO TABS
0.5000 mg | ORAL_TABLET | Freq: Two times a day (BID) | ORAL | Status: DC
  Administered 2017-12-12 – 2017-12-15 (×6): 0.5 mg via ORAL
  Filled 2017-12-12 (×6): qty 1

## 2017-12-12 MED ORDER — BISACODYL 5 MG PO TBEC: 5.0000 mg | DELAYED_RELEASE_TABLET | Freq: Every day | ORAL | Status: DC | PRN

## 2017-12-12 NOTE — Progress Notes (Signed)
[facility] Progress Note  Patient: [name] [id number]:[id number]   PCP: [name], MD DOB: 05/26/1966   DOA: 12/10/2017   DOS: 12/12/2017   Date of Service: the patient was seen and examined on 12/12/2017  Subjective: Feeling better this morning, later in the afternoon complaining of the recurrent nausea without any vomiting.  Persistent dry heaving.  No chest pain no abdominal pain.  No fever no chills.  No vomiting  Brief hospital course: Pt. with PMH of type I DM, gastroparesis; admitted on 12/10/2017, presented with complaint of abdominal pain and nausea as well as noncompliance with insulin, was found to have DKA. Currently further plan is continue current management.  Assessment and Plan: 1.  DKA. Type 1 diabetes mellitus. Noncompliance. Controlled. Associated with diabetic gastroparesis. Recent hemoglobin A1c 10.6. Patient appears to have noncompliance with medical regimen as well as diet regimen. At present anion gap is closed for 3 back-to-back BMP.  Will advance to carb modified diet.  Continue 15 units of Levemir per home regimen. Also advancing to sliding scale insulin. Will use 3 units of AC coverage. Appreciate diabetes collimators consultation. Stressed to the patient the importance of continuous use of insulin despite her not feeling well due to her type I status.  2.  Diabetic gastroparesis. On scheduled Zofran for now. On Mestinon as an outpatient.  3.  Acute kidney injury. Renal function worsened secondary to dehydration on admission. Now with hydration currently getting better and almost close to baseline. Monitor.  4.  Leukocytosis.   likely stress reaction. Monitor.  Diet: Carb modified diet DVT Prophylaxis: subcutaneous Heparin Advance goals of care discussion: full code  Family Communication: no family was present at bedside, at the time of interview.  Disposition:  Discharge to home tomorrow.  Consultants: none Procedures:  nonoe  Antibiotics: Anti-infectives (From admission, onward)   None       Objective: Physical Exam: Vitals:   12/11/17 1559 12/11/17 2110 12/12/17 0516 12/12/17 1411  BP: 130/61 115/60 (!) 156/77 (!) 179/80  Pulse: 96 83 81 91  Resp: 20   20  Temp: 98.3 F (36.8 C)  98.6 F (37 C) 98.3 F (36.8 C)  TempSrc: Oral  Oral Oral  SpO2: 100% 99% 99% 100%  Weight:      Height:        Intake/Output Summary (Last 24 hours) at 12/12/2017 1707 Last data filed at 12/12/2017 1332 Gross per 24 hour  Intake 1988.33 ml  Output -  Net 1988.33 ml   Filed Weights   12/10/17 1330 12/10/17 2000  Weight: 68 kg (150 lb) 65 kg (143 lb 4.8 oz)   General: Alert, Awake and Oriented to Time, Place and Person. Appear in mild distress, affect appropriate Eyes: PERRL, Conjunctiva normal ENT: Oral Mucosa clear moist. Neck: no JVD, no Abnormal Mass Or lumps Cardiovascular: S1 and S2 Present, no Murmur, Peripheral Pulses Present Respiratory: normal respiratory effort, Bilateral Air entry equal and Decreased, no use of accessory muscle, Clear to Auscultation, no Crackles, no wheezes Abdomen: Bowel Sound p3sent, Soft and p tenderness, no hernia Skin: no redness, no Rash, no induration Extremities: no Pedal edema, no calf tenderness Neurologic: Grossly no focal neuro deficit. Bilaterally Equal motor strength  Data Reviewed: CBC: Recent Labs  Lab 12/10/17 1404 12/11/17 0222  WBC 19.5* 14.5*  HGB 12.4 11.1*  HCT 39.9 34.3*  MCV 88.3 84.3  PLT 345 269   Basic Metabolic Panel: Recent Labs  Lab 12/10/17 1914 12/10/17 2202 12/11/17 0222 12/11/17  0654 12/12/17 0549  NA 145 146* 143 143 138  K 5.1 4.3 3.8 4.0 4.1  CL 112* 118* 120* 120* 115*  CO2 13* 16* 18* 17* 19*  GLUCOSE 360* 236* 188* 123* 295*  BUN 22* 22* 20 21* 16  CREATININE 1.66* 1.44* 1.40* 1.29* 1.09*  CALCIUM 9.9 9.4 8.5* 8.3* 8.4*    Liver Function Tests: Recent Labs  Lab 12/10/17 1404  AST 29  ALT 23  ALKPHOS 104   BILITOT 1.4*  PROT 8.2*  ALBUMIN 4.4   Recent Labs  Lab 12/10/17 1404  LIPASE 26   No results for input(s): AMMONIA in the last 168 hours. Coagulation Profile: No results for input(s): INR, PROTIME in the last 168 hours. Cardiac Enzymes: No results for input(s): CKTOTAL, CKMB, CKMBINDEX, TROPONINI in the last 168 hours. BNP (last 3 results) No results for input(s): PROBNP in the last 8760 hours. CBG: Recent Labs  Lab 12/11/17 1706 12/11/17 2106 12/12/17 0809 12/12/17 1152 12/12/17 1602  GLUCAP 119* 140* 309* 162* 187*   Studies: No results found.  Scheduled Meds: . busPIRone  10 mg Oral TID  . clonazePAM  0.5 mg Oral BID  . enoxaparin (LOVENOX) injection  40 mg Subcutaneous Q24H  . insulin aspart  0-5 Units Subcutaneous QHS  . insulin aspart  0-9 Units Subcutaneous TID WC  . insulin aspart  5 Units Subcutaneous TID WC  . insulin detemir  15 Units Subcutaneous Q24H  . ondansetron (ZOFRAN) IV  4 mg Intravenous Q6H  . pantoprazole  40 mg Oral Daily  . pregabalin  150 mg Oral Daily  . pyridostigmine  30 mg Oral TID WC   Continuous Infusions: . sodium chloride 75 mL/hr at 12/12/17 0600  . famotidine (PEPCID) IV Stopped (12/12/17 1252)   PRN Meds: bisacodyl, hydrALAZINE, polyethylene glycol, promethazine, zolpidem  Time spent: 35 minutes  Author: Berle Mull, MD [facility] Pager: [phone] 12/12/2017 5:07 PM  If 7PM-7AM, please contact night-coverage at [email or web address], password [facility]

## 2017-12-12 NOTE — Care Management Note (Signed)
Case Management Note  Patient Details  Name: [name] MRN: [id number] Date of Birth: 29-May-1966  Subjective/Objective:                  Discharged to home  Action/Plan: No barriers to dc Expected Discharge Date:  12/12/17               Expected Discharge Plan:  Home/Self Care  In-House Referral:     Discharge planning Services  CM Consult  Post Acute Care Choice:    Choice offered to:     DME Arranged:    DME Agency:     HH Arranged:    [place] Agency:     Status of Service:  Completed, signed off  If discussed at [employer] of Stay Meetings, dates discussed:    Additional Comments:  Leeroy Cha, RN 12/12/2017, 8:23 AM

## 2017-12-12 NOTE — Progress Notes (Signed)
Patient with persistent nausea and dry heaving throughout the day and MD made aware. No visible vomit seen today, only clear thin saliva that she is coughing up. After phenergan given it seemed to help patient, but when asked she stated " no it didn't help".

## 2017-12-12 NOTE — Discharge Instructions (Signed)
Diabetes Mellitus and Sick Day Management Blood sugar (glucose) can be difficult to control when you are sick. Common illnesses that can cause problems for people with diabetes (diabetes mellitus) include colds, fever, flu (influenza), nausea, vomiting, and diarrhea. These illnesses can cause stress and loss of body fluids (dehydration), and those issues can cause blood glucose levels to increase. Because of this, it is very important to take your insulin and diabetes medicines and eat some form of carbohydrate when you are sick. You should make a plan for days when you are sick (sick day plan) as part of your diabetes management plan. You and your health care provider should make this plan in advance. The following guidelines are intended to help you manage an illness that lasts for about 24 hours or less. Your health care provider may also give you more specific instructions. What do I need to do to manage my blood glucose?  Check your blood glucose every 2-4 hours, or as often as told by your health care provider.  Know your sick day treatment goals. Your target blood glucose levels may be different when you are sick.  If you use insulin, take your usual dose. ? If your blood glucose continues to be too high, you may need to take an additional insulin dose as told by your health care provider.  If you use oral diabetes medicine, you may need to stop taking it if you are not able to eat or drink normally. Ask your health care provider about whether you need to stop taking these medicines while you are sick.  If you use injectable hormone medicines other than insulin to control your diabetes, ask your health care provider about whether you need to stop taking these medicines while you are sick. What else can I do to manage my diabetes when I am sick? Check your ketones  If you have type 1 diabetes, check your urine ketones every 4 hours.  If you have type 2 diabetes, check your urine ketones as  often as told by your health care provider. Drink fluids  Drink enough fluid to keep your urine clear or pale yellow. This is especially important if you have a fever, vomiting, or diarrhea. Those symptoms can lead to dehydration.  Follow any instructions from your health care provider about beverages to avoid. ? Do not drink alcohol, caffeine, or drinks that contain a lot of sugar. Take medicines as directed  Take-over-the-counter and prescription medicines only as told by your health care provider.  Check medicine labels for added sugars. Some medicines may contain sugar or types of sugars that can raise your blood glucose level. What foods can I eat when I am sick? You need to eat some form of carbohydrates when you are sick. You should eat 45-50 grams (45-50 g) of carbohydrates every 3-4 hours until you feel better. All of the food choices below contain about 15 g of carbohydrates. Plan ahead and keep some of these foods around so you have them if you get sick.  4-6 oz (120-177 mL) carbonated beverage that contains sugar, such as regular (not diet) soda. You may be able to drink carbonated beverages more easily if you open the beverage and let it sit at room temperature for a few minutes before drinking.   of a twin frozen ice pop.  4 oz (120 g) regular gelatin.  4 oz (120 mL) fruit juice.  4 oz (120 g) ice cream or frozen yogurt.  2 oz (60  g) sherbet.  8 oz (240 mL) clear broth or soup.  4 oz (120 g) regular custard.  4 oz (120 g) regular pudding.  8 oz (240 g) plain yogurt.  1 slice bread or toast.  6 saltine crackers.  5 vanilla wafers.  Questions to ask your health care provider Consider asking the following questions so you know what to do on days when you are sick:  Should I adjust my diabetes medicines?  How often do I need to check my blood glucose?  What supplies do I need to manage my diabetes at home when I am sick?  What number can I call if I have  questions?  What foods and drinks should I avoid?  Contact a health care provider if:  You develop symptoms of diabetic ketoacidosis, such as: ? Fatigue. ? Weight loss. ? Excessive thirst. ? Light-headedness. ? Fruity or sweet-smelling breath. ? Excessive urination. ? Vision changes. ? Confusion or irritability. ? Nausea. ? Vomiting. ? Rapid breathing. ? Pain in the abdomen. ? Feeling flushed.  You are unable to drink fluids without vomiting.  You have any of the following for more than 6 hours: ? Nausea. ? Vomiting. ? Diarrhea.  Your blood glucose is at or above 240 mg/dL (13.3 mmol/L), even after you take an additional insulin dose.  You have a change in how you think, feel, or act (mental status).  You develop another serious illness.  You have been sick or have had a fever for 2 days or longer and you are not getting better. Get help right away if:  Your blood glucose is lower than 54 mg/dL (3.0 mmol/L).  You have difficulty breathing.  You have moderate or high ketone levels in your urine.  You used emergency glucagon to treat low blood glucose. Summary  Blood sugar (glucose) can be difficult to control when you are sick. Common illnesses that can cause problems for people with diabetes (diabetes mellitus) include colds, fever, flu (influenza), nausea, vomiting, and diarrhea.  Illnesses can cause stress and loss of body fluids (dehydration), and those issues can cause blood glucose levels to increase.  Make a plan for days when you are sick (sick day plan) as part of your diabetes management plan. You and your health care provider should make this plan in advance.  It is very important to take your insulin and diabetes medicines and to eat some form of carbohydrate when you are sick.  Contact your health care provider if have problems managing your blood glucose levels when you are sick, or if you have been sick or had a fever for 2 days or longer and are  not getting better. This information is not intended to replace advice given to you by your health care provider. Make sure you discuss any questions you have with your health care provider. Document Released: 06/14/2003 Document Revised: 03/09/2016 Document Reviewed: 03/09/2016 Elsevier Interactive Patient Education  2018 [place]. Correction Insulin Correction insulin, also called corrective insulin or a supplemental dose, is a small amount of insulin that can be used to lower your blood sugar (glucose) if it is too high. You may be instructed to check your blood glucose at certain times of the day and to use correction insulin as needed to lower your blood glucose to your target range. Correction insulin is primarily used as part of diabetes management. It may also be prescribed for people who do not have diabetes. What is a correction scale? A correction scale,  also called a sliding scale, is prescribed by your health care provider to help you determine when you need correction insulin. Your correction scale is based on your individual treatment goals, and it has two parts:  Ranges of blood glucose levels.  How much correction insulin to give yourself if your blood sugar falls within a certain range.  If your blood glucose is in your desired range, you will not need correction insulin and you should take your normal insulin dose. What type of insulin do I need? Your health care provider may prescribe rapid-acting or short-acting insulin for you to use as correction insulin. Rapid-acting insulin:  Starts working quickly, in as little as 5 minutes.  Can last for 3-6 hours.  Works well when taken right before a meal to quickly lower blood glucose. Short-acting insulin:  Starts working in about 30 minutes.  Can last for 6-8 hours.  Should be taken about 30 minutes before you start eating a meal. Talk with your health care provider or pharmacist about which type of correction  insulin to take and when to take it. If you use insulin to control your diabetes, you should use correction insulin in addition to the longer-acting (basal) insulin that you normally use. How do I manage my blood glucose with correction insulin? Giving a correction dose  Check your blood glucose as directed by your health care provider.  Use your correction scale to find the range that your blood glucose is in.  Identify the units of insulin that match your blood glucose range.  Make sure you have food available that you can eat in the next 15-30 minutes, after your correction dose.  Give yourself the dose of correction insulin that your health care provider has prescribed in your correction scale. Always make sure you are using the right type of insulin. ? If your correction insulin is rapid-acting, start eating a meal within 15 minutes after your correction dose to keep your blood glucose from getting too low. ? If your correction insulin is short-acting, start eating a meal within 30 minutes after your correction dose to keep your blood glucose from getting too low. Keeping a blood glucose log  Write down your blood glucose test results and the amount of insulin that you give yourself. Do this every time you check blood glucose or take insulin. Bring this log with you to your medical visits. This information will help your health care provider to manage your medicines.  Note anything that may affect your blood glucose, such as: ? Changes in normal exercise or activity. ? Changes in your normal schedule, such as changes in your sleep routine, going on vacation, changing your diet, or holidays. ? New over-the-counter or prescription medicines. ? Illness, stress, or anxiety. ? Changes in the time that you took your medicine or insulin. ? Changes in your meals, such as skipping a meal, having a late meal, or dining out. ? Eating things that may affect blood glucose, such as snacks, meal  portions that are larger than normal, drinks that contain sugar, or eating less than usual. What do I need to know about hyperglycemia and hypoglycemia? What is hyperglycemia? Hyperglycemia, also called high blood glucose, occurs when blood glucose is too high. Make sure you know the early signs of hyperglycemia, such as:  Increased thirst.  Hunger.  Feeling very tired.  Needing to urinate more often than usual.  Blurry vision.  What is hypoglycemia? Hypoglycemia is also called low blood glucose. Be  aware of stacking your insulin doses. This happens when you correct a high blood glucose by giving yourself extra insulin too soon after a previous correction dose or mealtime dose. This may cause you to have too much insulin in your body and may put you at risk for hypoglycemia. Hypoglycemia occurs with a blood glucose level at or below 70 mg/dL (3.9 mmol/L). It is important to know the symptoms of hypoglycemia and treat it right away. Always have a 15-gram rapid-acting carbohydrate snack with you to treat low blood glucose. Family members and close friends should also know the symptoms and should understand how to treat hypoglycemia, in case you are not able to treat yourself. What are the symptoms of hypoglycemia? Hypoglycemia symptoms can include:  Hunger.  Anxiety.  Sweating and feeling clammy.  Confusion.  Dizziness or light-headedness.  Sleepiness.  Nausea.  Increased heart rate.  Headache.  Blurry vision.  Jerky movements that you cannot control (seizure).  Nightmares.  Tingling or numbness around the mouth, lips, or tongue.  A change in speech.  Decreased ability to concentrate.  A change in coordination.  Restless sleep.  Tremors or shakes.  Fainting.  Irritability.  How do I treat hypoglycemia? If you are alert and able to swallow safely, follow the 15:15 rule:  Take 15 grams of a rapid-acting carbohydrate. Rapid-acting options include: ? 1  tube of glucose gel. ? 3 glucose pills. ? 6-8 pieces of hard candy. ? 4 oz (120 mL) of fruit juice. ? 4 oz (120 mL) of regular (not diet) soda.  Check your blood glucose 15 minutes after you take the carbohydrate. ? If the repeat blood glucose level is still at or below 70 mg/dL (3.9 mmol/L), take 15 grams of a carbohydrate again. ? If your blood glucose level does not increase above 70 mg/dL (3.9 mmol/L) after 3 tries, seek emergency medical care.  After your blood glucose level returns to normal, eat a meal or a snack within 1 hour.  How do I treat severe hypoglycemia? Severe hypoglycemia is when your blood glucose level is at or below 54 mg/dL (3 mmol/L). Severe hypoglycemia is an emergency. Do not wait to see if the symptoms will go away. Get medical help right away. Call your local emergency services (911 in the [place] Do not drive yourself to the hospital. If you have severe hypoglycemia and you cannot eat or drink, you may need an injection of glucagon. A family member or close friend should learn how to check your blood glucose and how to give you a glucagon injection. Ask your health care provider if you need to have an emergency glucagon injection kit available. Severe hypoglycemia may need to be treated in a hospital. The treatment may include getting glucose through an IV tube. You may also need treatment for the cause of your hypoglycemia. Why do I need correction insulin if I do not have diabetes? If you do not have diabetes, your health care provider may prescribe insulin because:  Keeping your blood glucose in the target range is important for your overall health.  You are taking medicines that cause your blood glucose to be higher than normal.  Contact a health care provider if:  You develop low blood glucose that you are not able to treat yourself.  You have high blood glucose that you are not able to correct with correction insulin.  Your blood glucose is often too  low.  You used emergency glucagon to treat low blood glucose.  Get help right away if:  You become unresponsive. If this happens, someone else should call emergency services (911 in the [place] right away.  Your blood glucose is lower than 54 mg/dL (3.0 mmol/L).  You become confused or you have trouble thinking clearly.  You have difficulty breathing. Summary  Correction insulin is a small amount of insulin that can be used to lower your blood sugar (glucose) if it is too high.  Talk with your health care provider or pharmacist about which type of correction insulin to take and when to take it. If you use insulin to control your diabetes, you should use correction insulin in addition to the longer-acting (basal) insulin that you normally use.  You may be instructed to check your blood glucose at certain times of the day and to use correction insulin as needed to lower your blood glucose to your target range. Always keep a log of your blood glucose values and the amount of insulin that you used.  It is important to know the symptoms of hypoglycemia and treat it right away. Always have a 15-gram rapid-acting carbohydrate snack with you to treat low blood glucose. Family members and close friends should also know the symptoms and should understand how to treat hypoglycemia, in case you are not able to treat yourself. This information is not intended to replace advice given to you by your health care provider. Make sure you discuss any questions you have with your health care provider. Document Released: 11/02/2010 Document Revised: 03/09/2016 Document Reviewed: 03/09/2016 Elsevier Interactive Patient Education  [employer].

## 2017-12-13 ENCOUNTER — Inpatient Hospital Stay (HOSPITAL_COMMUNITY): Payer: Medicare Other

## 2017-12-13 LAB — BASIC METABOLIC PANEL
Anion gap: 17 — ABNORMAL HIGH (ref 5–15)
Anion gap: 11 (ref 5–15)
Anion gap: 8 (ref 5–15)
BUN: 9 mg/dL (ref 6–20)
BUN: 11 mg/dL (ref 6–20)
BUN: 12 mg/dL (ref 6–20)
CALCIUM: 8.8 mg/dL — AB (ref 8.9–10.3)
CHLORIDE: 111 mmol/L (ref 101–111)
CHLORIDE: 108 mmol/L (ref 101–111)
CO2: 14 mmol/L — AB (ref 22–32)
CO2: 19 mmol/L — ABNORMAL LOW (ref 22–32)
CO2: 22 mmol/L (ref 22–32)
CREATININE: 1.04 mg/dL — AB (ref 0.44–1.00)
CREATININE: 1.69 mg/dL — AB (ref 0.44–1.00)
Calcium: 8.9 mg/dL (ref 8.9–10.3)
Calcium: 8.6 mg/dL — ABNORMAL LOW (ref 8.9–10.3)
Chloride: 107 mmol/L (ref 101–111)
Creatinine, Ser: 1.32 mg/dL — ABNORMAL HIGH (ref 0.44–1.00)
GFR calc Af Amer: >60 mL/min (ref >60)
GFR calc Af Amer: 53 mL/min — ABNORMAL LOW (ref >60)
GFR calc non Af Amer: >60 mL/min (ref >60)
GFR calc non Af Amer: 46 mL/min — ABNORMAL LOW (ref >60)
GFR calc non Af Amer: 34 mL/min — ABNORMAL LOW (ref >60)
GFR, EST AFRICAN AMERICAN: 40 mL/min — AB (ref >60)
GLUCOSE: 322 mg/dL — AB (ref 65–99)
Glucose, Bld: 93 mg/dL (ref 65–99)
Glucose, Bld: 299 mg/dL — ABNORMAL HIGH (ref 65–99)
POTASSIUM: 2.9 mmol/L — AB (ref 3.5–5.1)
POTASSIUM: 3.6 mmol/L (ref 3.5–5.1)
Potassium: 3.7 mmol/L (ref 3.5–5.1)
Sodium: 138 mmol/L (ref 135–145)
Sodium: 141 mmol/L (ref 135–145)
Sodium: 138 mmol/L (ref 135–145)

## 2017-12-13 LAB — GLUCOSE, CAPILLARY
Glucose-Capillary: 105 mg/dL — ABNORMAL HIGH (ref 65–99)
Glucose-Capillary: 243 mg/dL — ABNORMAL HIGH (ref 65–99)
Glucose-Capillary: 27 mg/dL — CL (ref 65–99)
Glucose-Capillary: 275 mg/dL — ABNORMAL HIGH (ref 65–99)
Glucose-Capillary: 373 mg/dL — ABNORMAL HIGH (ref 65–99)
Glucose-Capillary: 378 mg/dL — ABNORMAL HIGH (ref 65–99)
Glucose-Capillary: 62 mg/dL — ABNORMAL LOW (ref 65–99)
Glucose-Capillary: 65 mg/dL (ref 65–99)

## 2017-12-13 LAB — LIPASE, BLOOD: LIPASE: 27 U/L (ref 11–51)

## 2017-12-13 LAB — LACTIC ACID, PLASMA: Lactic Acid, Venous: 3 mmol/L (ref 0.5–1.9)

## 2017-12-13 MED ORDER — SODIUM CHLORIDE 0.9 % IV BOLUS: 1000.0000 mL | INTRAVENOUS | Status: DC

## 2017-12-13 MED ORDER — INSULIN DETEMIR 100 UNIT/ML ~~LOC~~ SOLN
15.0000 [IU] | Freq: Every day | SUBCUTANEOUS | Status: DC
  Filled 2017-12-13: qty 0.15

## 2017-12-13 MED ORDER — LIDOCAINE HCL 1 % IJ SOLN
INTRAMUSCULAR | Status: AC | PRN
  Administered 2017-12-13: 5 mL

## 2017-12-13 MED ORDER — INSULIN ASPART 100 UNIT/ML ~~LOC~~ SOLN
10.0000 [IU] | Freq: Once | SUBCUTANEOUS | Status: AC
  Administered 2017-12-13: 10 [IU] via SUBCUTANEOUS

## 2017-12-13 MED ORDER — POTASSIUM CHLORIDE 10 MEQ/100ML IV SOLN: 10.0000 meq | INTRAVENOUS | Status: AC

## 2017-12-13 MED ORDER — INSULIN ASPART 100 UNIT/ML ~~LOC~~ SOLN
7.0000 [IU] | Freq: Once | SUBCUTANEOUS | Status: AC
  Administered 2017-12-13: 7 [IU] via INTRAVENOUS

## 2017-12-13 MED ORDER — LIDOCAINE HCL 1 % IJ SOLN
INTRAMUSCULAR | Status: AC
  Filled 2017-12-13: qty 20

## 2017-12-13 MED ORDER — ONDANSETRON HCL 4 MG/2ML IJ SOLN
4.0000 mg | Freq: Once | INTRAMUSCULAR | Status: AC
  Administered 2017-12-13: 4 mg via INTRAMUSCULAR

## 2017-12-13 MED ORDER — INSULIN ASPART 100 UNIT/ML ~~LOC~~ SOLN: 20.0000 [IU] | Freq: Once | SUBCUTANEOUS | Status: DC

## 2017-12-13 MED ORDER — INSULIN ASPART 100 UNIT/ML ~~LOC~~ SOLN
0.0000 [IU] | SUBCUTANEOUS | Status: DC
  Administered 2017-12-13: 9 [IU] via SUBCUTANEOUS
  Administered 2017-12-13 – 2017-12-14 (×2): 5 [IU] via SUBCUTANEOUS

## 2017-12-13 MED ORDER — SODIUM CHLORIDE 0.9% FLUSH
10.0000 mL | INTRAVENOUS | Status: DC | PRN
  Administered 2017-12-14: 20 mL
  Filled 2017-12-13: qty 40

## 2017-12-13 MED ORDER — POTASSIUM CHLORIDE CRYS ER 20 MEQ PO TBCR
40.0000 meq | EXTENDED_RELEASE_TABLET | Freq: Once | ORAL | Status: AC
  Administered 2017-12-13: 40 meq via ORAL
  Filled 2017-12-13: qty 2

## 2017-12-13 MED ORDER — INSULIN DETEMIR 100 UNIT/ML ~~LOC~~ SOLN
5.0000 [IU] | Freq: Once | SUBCUTANEOUS | Status: AC
  Administered 2017-12-13: 5 [IU] via SUBCUTANEOUS
  Filled 2017-12-13: qty 0.05

## 2017-12-13 MED ORDER — SODIUM CHLORIDE 0.9 % IV BOLUS
1000.0000 mL | Freq: Once | INTRAVENOUS | Status: AC
  Administered 2017-12-13: 1000 mL via INTRAVENOUS

## 2017-12-13 MED ORDER — INSULIN ASPART 100 UNIT/ML ~~LOC~~ SOLN
13.0000 [IU] | Freq: Once | SUBCUTANEOUS | Status: AC
  Administered 2017-12-13: 13 [IU] via SUBCUTANEOUS

## 2017-12-13 NOTE — Progress Notes (Addendum)
Inpatient Diabetes Program Recommendations  AACE/ADA: New Consensus Statement on Inpatient Glycemic Control (2015)  Target Ranges:  Prepandial:   less than 140 mg/dL      Peak postprandial:   less than 180 mg/dL (1-2 hours)      Critically ill patients:  140 - 180 mg/dL   Lab Results  Component Value Date   GLUCAP 27 (LL) 12/13/2017   HGBA1C 10.6 (H) 11/07/2017    Review of Glycemic Control  Had low of 26 mg/dL.  FBS this am 322 mg/dL. Received 30 units of Novolog within 2.5 hours, likely reason for hypoglycemia. Hypoglycemia treated with 30 grams CHO, recheck blood sugars in 15 min, and order lunch tray.   Needs meal coverage insulin since pt is Type 1.  Inpatient Diabetes Program Recommendations:     Change Novolog to 0-9 units tidwc and hs Add meal coverage insulin - Novolog 4 units tidwc.  Continue to follow.   Thank you. Lorenda Peck, RD, LDN, CDE Inpatient Diabetes Coordinator [phone]

## 2017-12-13 NOTE — Procedures (Signed)
  Procedure: R arm DL PICC placed   EBL:   minimal Complications:  none immediate  See full dictation in [facility].  Dillard Cannon MD Main # [phone] Pager  [phone]

## 2017-12-13 NOTE — Progress Notes (Signed)
Inpatient Diabetes Program Recommendations  AACE/ADA: New Consensus Statement on Inpatient Glycemic Control (2015)  Target Ranges:  Prepandial:   less than 140 mg/dL      Peak postprandial:   less than 180 mg/dL (1-2 hours)      Critically ill patients:  140 - 180 mg/dL   Lab Results  Component Value Date   GLUCAP 243 (H) 12/13/2017   HGBA1C 10.6 (H) 11/07/2017    Review of Glycemic Control  Spoke with pt's sister this morning about pt's glycemic control at home. Sister states pt eats a lot of fried foods, sweets, and high-CHO foods. Sister states pt does not check her blood sugars very often. No exercise and "just lays around all day." She said pt is not doing anything to help her gastroparesis. Pt's sister thinks pt is depressed, has strained family relationships with son and herself. States she does not have any lows at home. Requested pt's sister to remind pt to take her Lantus, even when she is nauseated. Has appt with Dr. Dwyane Dee on 12/29/2017. This appt was given to pt's sister.  Will talk with pt regarding importance of checking her blood sugars and again, taking her Lantus, even when she is nauseated.   Continue to follow.  Thank you. Lorenda Peck, RD, LDN, CDE Inpatient Diabetes Coordinator [phone]

## 2017-12-13 NOTE — Progress Notes (Signed)
MEDICATION-RELATED CONSULT NOTE   IR Procedure Consult - Anticoagulant/Antiplatelet PTA/Inpatient Med List Review by Pharmacist    Procedure: R arm DL PICC placed    Completed: 12/13/17 @ 6294  Post-Procedural bleeding risk per IR MD assessment:  LOW  Antithrombotic medications on inpatient or PTA profile prior to procedure:     Lovenox 40mg  sq Q24h for VTE px- last dose administered 6/20 @ 2103  Recommended restart time per IR Post-Procedure Guidelines:   At least 4hr or at next scheduled interval  Other considerations:      Plan:     Continue Lovenox 40mg  sq q24h - next dose scheduled for 10pm tonight  Netta Cedars, PharmD, BCPS 12/13/2017@4 :48 PM

## 2017-12-13 NOTE — Progress Notes (Addendum)
[facility] Progress Note  Patient: [name] FYB:[id number]   PCP: [name], MD DOB: 30-Nov-1965   DOA: 12/10/2017   DOS: 12/13/2017   Date of Service: the patient was seen and examined on 12/13/2017  Subjective: Still nauseous also complains about his diffuse abdominal pain.  No fever no chills.  Had multiple bowel movements yesterday.  No cough no shortness of breath.  Brief hospital course: Pt. with PMH of type I DM, gastroparesis; admitted on 12/10/2017, presented with complaint of abdominal pain and nausea, was found to have DKA secondary to noncompliance with insulin. Currently further plan is continue current management.  Assessment and Plan: 1.  DKA. Type 1 diabetes mellitus. Noncompliance. UnControlled. Associated with diabetic gastroparesis. Recent hemoglobin A1c 10.6. Patient appears to have noncompliance with medical regimen as well as diet regimen. Initially was in the stepdown unit, on IV insulin with glucose stabilizer protocol.  Anion gap closed and was transferred to floor. Continues to have nausea and dry heaving without any vomiting. Anion gap on 12/13/2017 is 17, CO2 14. Maintaining the patient n.p.o. with medication. IVF normal saline bolus. Potassium okay, will give 0.1 unit/kg IV insulin bolus. Recheck BMP in 2 hours if anion gap still remains elevated consider IV insulin with glucose stabilizer protocol, if the anion gap is closed consider Levemir with subcu insulin protocol. Stressed to the patient the importance of continuous use of insulin despite her not feeling well due to her type I status.  Addendum: 0.2 units/kg subcu insulin, recheck in 1 hour may require 0.1 unit/kg repeat dosing of subcu insulin. Unable to get receive IV fluid bolus due to lack of access.  Berle Mull 8:37 AM 12/13/2017    2.  Diabetic gastroparesis. On scheduled Zofran for now. On Mestinon as an outpatient.  3.  Acute kidney injury. Renal function worsened  secondary to dehydration on admission. Now with hydration currently getting better and almost close to baseline. Monitor.  4.  Leukocytosis.   likely stress reaction. Monitor.  5.  Abdominal pain. Check x-ray abdomen, lactic acid and lipase level.  Addendum: Unfortunate situation. Patient has limited IV access and IV team has exhausted all the options. We will be using subcutaneous insulin protocol for now to control patient's insulin although patient will not be able to get IV fluid bolus until she gets an IV line. IR consulted for PICC line insertion although patient is diabetic, uncontrolled and will likely end up with a ESRD situation sooner. Patient so far has required IR guided PICC line placement the patient has returned to the hospital which will limit her options for AV fistula in future. Not an ideal candidate for permanent IV access-Port-A-Cath due to history of medical noncompliance. At present for lack of any other option will continue with current request for IR guided line placement.  Berle Mull 8:34 AM 12/13/2017    Diet: N.p.o. except medications DVT Prophylaxis: subcutaneous Heparin  Advance goals of care discussion: full code  Family Communication: no family was present at bedside, at the time of interview.   Disposition:  Discharge to home.  Consultants: none Procedures: none  Antibiotics: Anti-infectives (From admission, onward)   None       Objective: Physical Exam: Vitals:   12/12/17 0516 12/12/17 1411 12/12/17 2056 12/13/17 0522  BP: (!) 156/77 (!) 179/80 (!) 160/88 (!) 187/82  Pulse: 81 91 90 (!) 108  Resp:  20 (!) 21 19  Temp: 98.6 F (37 C) 98.3 F (36.8 C) 99.1 F (37.3  C) 98.3 F (36.8 C)  TempSrc: Oral Oral Oral Oral  SpO2: 99% 100% 100% 100%  Weight:      Height:        Intake/Output Summary (Last 24 hours) at 12/13/2017 3500 Last data filed at 12/13/2017 0600 Gross per 24 hour  Intake 3385 ml  Output 2 ml  Net 3383 ml    Filed Weights   12/10/17 1330 12/10/17 2000  Weight: 68 kg (150 lb) 65 kg (143 lb 4.8 oz)   General: Alert, Awake and Oriented to Time, Place and Person. Appear in moderate distress, affect flat Eyes: PERRL, Conjunctiva normal ENT: Oral Mucosa clear moist. Neck: difficult to assess  JVD, no Abnormal Mass Or lumps Cardiovascular: S1 and S2 Present, no Murmur, Peripheral Pulses Present Respiratory: normal respiratory effort, Bilateral Air entry equal and Decreased, no use of accessory muscle, Clear to Auscultation, no Crackles, no wheezes Abdomen: Bowel Sound present, Soft and mild tenderness, no hernia Skin: no redness, no Rash, no induration Extremities: no Pedal edema, no calf tenderness Neurologic: Grossly no focal neuro deficit. Bilaterally Equal motor strength  Data Reviewed: CBC: Recent Labs  Lab 12/10/17 1404 12/11/17 0222  WBC 19.5* 14.5*  HGB 12.4 11.1*  HCT 39.9 34.3*  MCV 88.3 84.3  PLT 345 938   Basic Metabolic Panel: Recent Labs  Lab 12/10/17 2202 12/11/17 0222 12/11/17 0654 12/12/17 0549 12/13/17 0619  NA 146* 143 143 138 138  K 4.3 3.8 4.0 4.1 3.7  CL 118* 120* 120* 115* 107  CO2 16* 18* 17* 19* 14*  GLUCOSE 236* 188* 123* 295* 322*  BUN 22* 20 21* 16 9  CREATININE 1.44* 1.40* 1.29* 1.09* 1.04*  CALCIUM 9.4 8.5* 8.3* 8.4* 8.8*    Liver Function Tests: Recent Labs  Lab 12/10/17 1404  AST 29  ALT 23  ALKPHOS 104  BILITOT 1.4*  PROT 8.2*  ALBUMIN 4.4   Recent Labs  Lab 12/10/17 1404  LIPASE 26   No results for input(s): AMMONIA in the last 168 hours. Coagulation Profile: No results for input(s): INR, PROTIME in the last 168 hours. Cardiac Enzymes: No results for input(s): CKTOTAL, CKMB, CKMBINDEX, TROPONINI in the last 168 hours. BNP (last 3 results) No results for input(s): PROBNP in the last 8760 hours. CBG: Recent Labs  Lab 12/12/17 0809 12/12/17 1152 12/12/17 1602 12/12/17 2043 12/13/17 0740  GLUCAP 309* 162* 187* 163*  378*   Studies: No results found.  Scheduled Meds: . busPIRone  10 mg Oral TID  . clonazePAM  0.5 mg Oral BID  . enoxaparin (LOVENOX) injection  40 mg Subcutaneous Q24H  . insulin aspart  7 Units Intravenous Once  . ondansetron (ZOFRAN) IV  4 mg Intravenous Q6H  . pantoprazole  40 mg Oral Daily  . pregabalin  150 mg Oral Daily  . pyridostigmine  30 mg Oral TID WC   Continuous Infusions: . sodium chloride     PRN Meds: bisacodyl, hydrALAZINE, polyethylene glycol, promethazine, zolpidem  Time spent: 35 minutes  Author: Berle Mull, MD [facility] Pager: [phone] 12/13/2017 8:12 AM  If 7PM-7AM, please contact night-coverage at [email or web address], password [facility]

## 2017-12-14 LAB — GLUCOSE, CAPILLARY
GLUCOSE-CAPILLARY: 56 mg/dL — AB (ref 65–99)
GLUCOSE-CAPILLARY: 69 mg/dL (ref 65–99)
GLUCOSE-CAPILLARY: 98 mg/dL (ref 65–99)
Glucose-Capillary: 284 mg/dL — ABNORMAL HIGH (ref 65–99)
Glucose-Capillary: 186 mg/dL — ABNORMAL HIGH (ref 65–99)
Glucose-Capillary: 56 mg/dL — ABNORMAL LOW (ref 65–99)
Glucose-Capillary: 271 mg/dL — ABNORMAL HIGH (ref 65–99)
Glucose-Capillary: 228 mg/dL — ABNORMAL HIGH (ref 65–99)

## 2017-12-14 LAB — BASIC METABOLIC PANEL
Anion gap: 7 (ref 5–15)
BUN: 16 mg/dL (ref 6–20)
CALCIUM: 8.5 mg/dL — AB (ref 8.9–10.3)
CO2: 21 mmol/L — AB (ref 22–32)
CREATININE: 1.72 mg/dL — AB (ref 0.44–1.00)
Chloride: 108 mmol/L (ref 101–111)
GFR calc Af Amer: 39 mL/min — ABNORMAL LOW (ref >60)
GFR, EST NON AFRICAN AMERICAN: 34 mL/min — AB (ref >60)
Glucose, Bld: 312 mg/dL — ABNORMAL HIGH (ref 65–99)
Potassium: 3.2 mmol/L — ABNORMAL LOW (ref 3.5–5.1)
Sodium: 136 mmol/L (ref 135–145)

## 2017-12-14 MED ORDER — SODIUM CHLORIDE 0.9 % IV SOLN
INTRAVENOUS | Status: DC
  Administered 2017-12-14 (×2): via INTRAVENOUS

## 2017-12-14 MED ORDER — INSULIN ASPART 100 UNIT/ML ~~LOC~~ SOLN
0.0000 [IU] | Freq: Three times a day (TID) | SUBCUTANEOUS | Status: DC
  Administered 2017-12-14: 5 [IU] via SUBCUTANEOUS
  Administered 2017-12-14: 2 [IU] via SUBCUTANEOUS
  Administered 2017-12-15: 3 [IU] via SUBCUTANEOUS
  Administered 2017-12-15: 9 [IU] via SUBCUTANEOUS

## 2017-12-14 MED ORDER — PROMETHAZINE HCL 25 MG/ML IJ SOLN: 12.5000 mg | Freq: Four times a day (QID) | INTRAMUSCULAR | Status: DC | PRN

## 2017-12-14 MED ORDER — INSULIN ASPART 100 UNIT/ML ~~LOC~~ SOLN
0.0000 [IU] | Freq: Every day | SUBCUTANEOUS | Status: DC
  Administered 2017-12-14: 2 [IU] via SUBCUTANEOUS

## 2017-12-14 MED ORDER — INSULIN ASPART 100 UNIT/ML ~~LOC~~ SOLN
4.0000 [IU] | Freq: Three times a day (TID) | SUBCUTANEOUS | Status: DC
  Administered 2017-12-14: 4 [IU] via SUBCUTANEOUS

## 2017-12-14 MED ORDER — ONDANSETRON HCL 4 MG/2ML IJ SOLN: 4.0000 mg | Freq: Four times a day (QID) | INTRAMUSCULAR | Status: DC | PRN

## 2017-12-14 MED ORDER — POTASSIUM CHLORIDE CRYS ER 20 MEQ PO TBCR
40.0000 meq | EXTENDED_RELEASE_TABLET | Freq: Once | ORAL | Status: AC
  Administered 2017-12-14: 40 meq via ORAL
  Filled 2017-12-14: qty 2

## 2017-12-14 NOTE — Progress Notes (Addendum)
[facility] Progress Note  Patient: [name] UVO:[id number]   PCP: [name], MD DOB: September 07, 1965   DOA: 12/10/2017   DOS: 12/14/2017   Date of Service: the patient was seen and examined on 12/14/2017  Subjective: no nausea, no vomiting, ordering breakfast. No BM  Brief hospital course: Pt. with PMH of type I DM, gastroparesis; admitted on 12/10/2017, presented with complaint of abdominal pain and nausea, was found to have DKA secondary to noncompliance with insulin. Went back to DKA, treated with subcu insulin due to lack of IV insulin.  Currently further plan is continue current management.  Assessment and Plan: 1. Recurrent DKA. Type 1 diabetes mellitus. Noncompliance. UnControlled. Associated with diabetic gastroparesis. Recent hemoglobin A1c 10.6. Patient appears to have noncompliance with medical regimen as well as diet regimen. Initially was in the stepdown unit, on IV insulin with glucose stabilizer protocol.  Anion gap closed and was transferred to floor. Continues to have nausea and dry heaving without any vomiting. Anion gap on 12/13/2017 is 17, CO2 14. On 06/22 gap is 7 Mild repeat DKA was treated with [place] insulin due to lack of access.  Now back on AC/HS coverage and premeal insulin. Along with 15 units levemir.  Monitor in hospital for now due to recurrent DKA.   2.  Diabetic gastroparesis. On scheduled Zofran, change to PRN now. On Mestinon as well  3.  Acute kidney injury. Renal function worsened secondary to dehydration on admission and again yesterday. Continue IVF  4.  Leukocytosis.   likely stress reaction. Monitor.  5.  Abdominal pain. x-ray abdomen, lactic acid and lipase level all unremarkable. Likely due to gastroparesis and DKA  6. recurrent DKA admission with poor IV access Unfortunate situation. Patient has limited IV access and IV team has exhausted all the options. IR consulted for PICC line insertion. Pt tolerated  procedure. patient is diabetic, uncontrolled. And although her renal function is stale for now, will likely end up with ESRD situation sooner. Not an ideal candidate for permanent IV access-Port-A-Cath due to history of medical noncompliance. At present for lack of any other option will continue with current request for IR guided line placement.  Diet: carb modified diet DVT Prophylaxis: subcutaneous Heparin  Advance goals of care discussion: full code  Family Communication: no family was present at bedside, at the time of interview.   Disposition:  Discharge to home likely tomorrow or Monday depending on stability of gap and ability to remove PICC line.   Consultants: none Procedures: none  Antibiotics: Anti-infectives (From admission, onward)   None       Objective: Physical Exam: Vitals:   12/13/17 0522 12/13/17 1447 12/13/17 2010 12/14/17 0533  BP: (!) 187/82 (!) 107/59 119/63 (!) 94/46  Pulse: (!) 108 96 96 77  Resp: 19 16 18 14   Temp: 98.3 F (36.8 C) 98.5 F (36.9 C) 97.8 F (36.6 C) 98 F (36.7 C)  TempSrc: Oral Oral Oral Oral  SpO2: 100% 99% 100% 97%  Weight:      Height:        Intake/Output Summary (Last 24 hours) at 12/14/2017 0839 Last data filed at 12/13/2017 1800 Gross per 24 hour  Intake 1070.4 ml  Output -  Net 1070.4 ml   Filed Weights   12/10/17 1330 12/10/17 2000  Weight: 68 kg (150 lb) 65 kg (143 lb 4.8 oz)   General: Alert, Awake and Oriented to Time, Place and Person. Appear in mild distress, affect flat Eyes: PERRL, Conjunctiva normal  ENT: Oral Mucosa clear moist. Neck: difficult to assess  JVD, no Abnormal Mass Or lumps Cardiovascular: S1 and S2 Present, no Murmur, Peripheral Pulses Present Respiratory: normal respiratory effort, Bilateral Air entry equal and Decreased, no use of accessory muscle, Clear to Auscultation, no Crackles, no wheezes Abdomen: Bowel Sound present, Soft and no tenderness, no hernia Skin: no redness, no Rash,  no induration Extremities: no Pedal edema, no calf tenderness Neurologic: Grossly no focal neuro deficit. Bilaterally Equal motor strength  Data Reviewed: CBC: Recent Labs  Lab 12/10/17 1404 12/11/17 0222  WBC 19.5* 14.5*  HGB 12.4 11.1*  HCT 39.9 34.3*  MCV 88.3 84.3  PLT 345 779   Basic Metabolic Panel: Recent Labs  Lab 12/12/17 0549 12/13/17 0619 12/13/17 1141 12/13/17 1716 12/14/17 0400  NA 138 138 141 138 136  K 4.1 3.7 2.9* 3.6 3.2*  CL 115* 107 111 108 108  CO2 19* 14* 19* 22 21*  GLUCOSE 295* 322* 93 299* 312*  BUN 16 9 11 12 16   CREATININE 1.09* 1.04* 1.32* 1.69* 1.72*  CALCIUM 8.4* 8.8* 8.9 8.6* 8.5*    Liver Function Tests: Recent Labs  Lab 12/10/17 1404  AST 29  ALT 23  ALKPHOS 104  BILITOT 1.4*  PROT 8.2*  ALBUMIN 4.4   Recent Labs  Lab 12/10/17 1404 12/13/17 1141  LIPASE 26 27   No results for input(s): AMMONIA in the last 168 hours. Coagulation Profile: No results for input(s): INR, PROTIME in the last 168 hours. Cardiac Enzymes: No results for input(s): CKTOTAL, CKMB, CKMBINDEX, TROPONINI in the last 168 hours. BNP (last 3 results) No results for input(s): PROBNP in the last 8760 hours. CBG: Recent Labs  Lab 12/13/17 1648 12/13/17 2052 12/13/17 2336 12/14/17 0418 12/14/17 0752  GLUCAP 275* 373* 105* 284* 186*   Studies: Ir Picc Placement Right >5 Yrs Inc Img Guide  Result Date: 12/13/2017 CLINICAL DATA:  Diabetic ketoacidosis, poor peripheral IV access EXAM: PICC PLACEMENT WITH ULTRASOUND AND FLUOROSCOPY FLUOROSCOPY TIME:  1.3 minutes; 196 uGym2 DAP TECHNIQUE: After written informed consent was obtained, patient was placed in the supine position on angiographic table. Patency of the right brachial vein was confirmed with ultrasound with image documentation. An appropriate skin site was determined. Skin site was marked. Region was prepped using maximum barrier technique including cap and mask, sterile gown, sterile gloves, large  sterile sheet, and Chlorhexidine as cutaneous antisepsis. The region was infiltrated locally with 1% lidocaine. Under real-time ultrasound guidance, the right brachial vein was accessed with a 21 gauge micropuncture needle; the needle tip within the vein was confirmed with ultrasound image documentation. Needle exchanged over a 018 guidewire for a peel-away sheath, through which a 5-French double-lumen power injectable PICC trimmed to 30cm was advanced, positioned with its tip near the cavoatrial junction. There was focal resistance in the central subclavian vein, overcome by using 2 parallel 018 guidewires. Spot chest radiograph confirms appropriate catheter position. Catheter was flushed per protocol and secured externally. The patient tolerated procedure well. COMPLICATIONS: COMPLICATIONS none IMPRESSION: 1. Technically successful five [place] double lumen power injectable PICC placement Electronically Signed   By: Lucrezia Europe M.D.   On: 12/13/2017 16:49    Scheduled Meds: . busPIRone  10 mg Oral TID  . clonazePAM  0.5 mg Oral BID  . enoxaparin (LOVENOX) injection  40 mg Subcutaneous Q24H  . insulin aspart  0-5 Units Subcutaneous QHS  . insulin aspart  0-9 Units Subcutaneous TID WC  . insulin aspart  4 Units Subcutaneous TID WC  . ondansetron (ZOFRAN) IV  4 mg Intravenous Q6H  . pantoprazole  40 mg Oral Daily  . pregabalin  150 mg Oral Daily  . pyridostigmine  30 mg Oral TID WC   Continuous Infusions:  PRN Meds: bisacodyl, hydrALAZINE, polyethylene glycol, promethazine, sodium chloride flush, zolpidem  Time spent: 35 minutes  Author: Berle Mull, MD [facility] Pager: [phone] 12/14/2017 8:39 AM  If 7PM-7AM, please contact night-coverage at [email or web address], password [facility]

## 2017-12-14 NOTE — Progress Notes (Signed)
Hypoglycemic Event  CBG: 56  Treatment: carb snack  Symptoms: none  Follow-up CBG: Time: 12:14 CBG Result: 69  Possible Reasons for Event: unknown  Comments/MD notified: yes via text page    Lezlie Octave

## 2017-12-14 NOTE — Progress Notes (Signed)
Hypoglycemic Event  CBG: 56  Treatment: carb snack  Symptoms: none  Follow-up CBG: Time:11:57 CBG Result:56  Possible Reasons for Event: unknown  Comments/MD notified: yes via text page    Lezlie Octave

## 2017-12-14 NOTE — Progress Notes (Signed)
Hypoglycemic Event  CBG:  69  Treatment: carb snack  Symptoms: none  Follow-up CBG: Time: 12:30 CBG Result: 98  Possible Reasons for Event: unknown  Comments/MD notified: yes via text page    [name]

## 2017-12-15 LAB — BASIC METABOLIC PANEL
Anion gap: 3 — ABNORMAL LOW (ref 5–15)
BUN: 15 mg/dL (ref 6–20)
CALCIUM: 8.4 mg/dL — AB (ref 8.9–10.3)
CO2: 22 mmol/L (ref 22–32)
Chloride: 115 mmol/L — ABNORMAL HIGH (ref 101–111)
Creatinine, Ser: 1.29 mg/dL — ABNORMAL HIGH (ref 0.44–1.00)
GFR calc Af Amer: 55 mL/min — ABNORMAL LOW (ref >60)
GFR, EST NON AFRICAN AMERICAN: 47 mL/min — AB (ref >60)
GLUCOSE: 151 mg/dL — AB (ref 65–99)
Potassium: 3.8 mmol/L (ref 3.5–5.1)
Sodium: 140 mmol/L (ref 135–145)

## 2017-12-15 LAB — GLUCOSE, CAPILLARY
GLUCOSE-CAPILLARY: 378 mg/dL — AB (ref 65–99)
GLUCOSE-CAPILLARY: 216 mg/dL — AB (ref 65–99)

## 2017-12-15 MED ORDER — INSULIN DETEMIR 100 UNIT/ML ~~LOC~~ SOLN
15.0000 [IU] | Freq: Every day | SUBCUTANEOUS | Status: DC
  Administered 2017-12-15: 15 [IU] via SUBCUTANEOUS
  Filled 2017-12-15: qty 0.15

## 2017-12-15 NOTE — Care Management Important Message (Signed)
Important Message  Patient Details  Name: [name] MRN: [id number] Date of Birth: [birth year]   Medicare Important Message Given:  Yes    Erenest Rasher, RN 12/15/2017, 11:53 AM

## 2017-12-15 NOTE — Progress Notes (Signed)
Nurse reviewed discharge instructions with pt. Pt verbalized understanding of discharge instructions and follow up appointments.  Pt called cab to take her home.  IV team d/c'd PICC line prior to discharge.

## 2017-12-15 NOTE — Discharge Summary (Signed)
[facility] Discharge Summary   Patient: [name] YOV:[id number]   PCP: [name], MD DOB: 1965-08-26   Date of admission: 12/10/2017   Date of discharge:  12/15/2017    Discharge Diagnoses:  Active Problems:   DKA (diabetic ketoacidoses) ([place])   Admitted From: home Disposition:  home  Recommendations for Outpatient Follow-up:  1. Please follow-up with PCP in 1 week.  Remain compliant with her medical and diabetic regimen.  Follow-up Information    [name], MD. Schedule an appointment as soon as possible for a visit in 1 week(s).   Specialty:  Family Medicine Contact information: [place] [address] [phone]          Diet recommendation: carb modified diet  Activity: The patient is advised to gradually reintroduce usual activities.  Discharge Condition: good  Code Status: full code  History of present illness: As per the H and P dictated on admission, "[name] is a 52 y.o. female with medical history significant of ABDs with multiple admissions for DKA admitted with DKA again today.  Patient has history of gastroparesis and has not been taking her insulin since yesterday.  Her last admission was in 10/1998 with the same.  She has had 6 admissions in the last 6 months.  Patient had persistent nausea and vomiting due to gastroparesis flare along with diffuse crampy abdominal pain she denies any chest pain shortness of breath cough fever or chills."  Hospital Course:  Summary of her active problems in the hospital is as following. 1. RecurrentDKA. Type 1 diabetes mellitus. Noncompliance. UnControlled. Associated with diabetic gastroparesis. Recent hemoglobin A1c 10.6. Patient appears to have noncompliance with medical regimen as well as diet regimen. Initially was in the stepdown unit, on IV insulin with glucose stabilizer protocol.  Anion gap closed and was transferred to floor. Continues to have nausea and dry  heaving without any vomiting. Anion gap on 12/13/2017 is 17, CO2 14. On 06/22 gap is 7 Mild repeat DKA was treated with [place] insulin due to lack of access.  Anion gap closed, sugars relatively controlled, will discharge home on home regimen of Levemir and sliding scale insulin with close follow-up with PCP. Remove PICC line.  2. Diabetic gastroparesis. On scheduled Zofran, change to PRN now. On Mestinon as well  3. Acute kidney injury. Renal function worsened secondary to dehydration on admission and again yesterday. Now stable, recommend oral hydration.  4. Leukocytosis.  likely stress reaction. Monitor.  5.  Abdominal pain. x-ray abdomen, lactic acid and lipase level all unremarkable. Likely due to gastroparesis and DKA  6. recurrent DKA admission with poor IV access Unfortunate situation. Patient has limited IV access and IV team has exhausted all the options. IR consulted for PICC line insertion. Pt tolerated procedure. patient is diabetic, uncontrolled. And although her renal function is stale for now, will likely end up with ESRD situation sooner. Not an ideal candidate for permanent IV access-Port-A-Cath due to history of medical noncompliance. At present for lack of any other option will continue with current request for IR guided line placement.   All other chronic medical condition were stable during the hospitalization.  Patient was ambulatory without any assistance. On the day of the discharge the patient's vitals were stable , and no other acute medical condition were reported by patient. the patient was felt safe to be discharge at home with family.  Consultants: IR Procedures: IR guided PICC line  DISCHARGE MEDICATION: Allergies as of 12/15/2017  Reactions   Anesthetics, Amide Nausea And Vomiting   Penicillins Diarrhea, Nausea And Vomiting, Other (See Comments)   Has patient had a PCN reaction causing immediate rash, facial/tongue/throat swelling,  SOB or lightheadedness with hypotension: Yes Has patient had a PCN reaction causing severe rash involving mucus membranes or skin necrosis: No Has patient had a PCN reaction that required hospitalization No Has patient had a PCN reaction occurring within the last 10 years: Yes  If all of the above answers are "NO", then may proceed with Cephalosporin use.   Buprenorphine Hcl Rash   Encainide Nausea And Vomiting   Metoclopramide Other (See Comments)   Dystonia, muscle rigidity.       Medication List    STOP taking these medications   nitrofurantoin (macrocrystal-monohydrate) 100 MG capsule Commonly known as:  MACROBID     TAKE these medications   bisacodyl 5 MG EC tablet Commonly known as:  DULCOLAX Take 5 mg by mouth daily as needed for moderate constipation.   busPIRone 10 MG tablet Commonly known as:  BUSPAR Take 1 tablet (10 mg total) by mouth 3 (three) times daily.   clonazePAM 0.5 MG disintegrating tablet Commonly known as:  KLONOPIN Take 1 tablet (0.5 mg total) by mouth 2 (two) times daily.   cyanocobalamin 1000 MCG tablet Take 1 tablet (1,000 mcg total) by mouth daily.   fluticasone 50 MCG/ACT nasal spray Commonly known as:  FLONASE Place 2 sprays into both nostrils daily.   furosemide 20 MG tablet Commonly known as:  LASIX Take 1 tablet (20 mg total) by mouth daily as needed for fluid or edema.   insulin aspart 100 UNIT/ML injection Commonly known as:  novoLOG Inject 0-12 Units into the skin 4 (four) times daily -  before meals and at bedtime. Uses a sliding scale: 70-119 = 0 units, 120-150 = 2 units, 151-180 =  4 units, 181-210 = 5 units, 211-240 = 6 units, 241-270 = 8 units, 271-300 = 9 units, 301-330 = 10 units, 331-360 = 11 units, 361-400 = 12 units, greater than 400 notify MD.   insulin detemir 100 UNIT/ML injection Commonly known as:  LEVEMIR Inject 0.15 mLs (15 Units total) into the skin every morning.   loratadine 10 MG tablet Commonly known as:   CLARITIN Take 1 tablet (10 mg total) by mouth daily.   mirtazapine 15 MG tablet Commonly known as:  REMERON Take 1 tablet (15 mg total) by mouth at bedtime.   ondansetron 8 MG disintegrating tablet Commonly known as:  ZOFRAN-ODT Take 1 tablet (8 mg total) by mouth every 8 (eight) hours as needed for nausea or vomiting.   pantoprazole 40 MG tablet Commonly known as:  PROTONIX Take 1 tablet (40 mg total) by mouth daily.   polyethylene glycol packet Commonly known as:  MIRALAX / GLYCOLAX Take 17 g by mouth daily as needed for mild constipation.   pregabalin 150 MG capsule Commonly known as:  LYRICA Take 1 capsule (150 mg total) by mouth daily.   pyridostigmine 60 MG tablet Commonly known as:  MESTINON Take 0.5 tablets (30 mg total) by mouth 3 (three) times daily. Take 1/2 with meals   SYRINGE 3CC/22GX1-1/2" 22G X 1-1/2" 3 ML Misc Inject 1,000 mcg into the muscle daily.      Allergies  Allergen Reactions  . Anesthetics, Amide Nausea And Vomiting  . Penicillins Diarrhea, Nausea And Vomiting and Other (See Comments)    Has patient had a PCN reaction causing immediate rash, facial/tongue/throat swelling,  SOB or lightheadedness with hypotension: Yes Has patient had a PCN reaction causing severe rash involving mucus membranes or skin necrosis: No Has patient had a PCN reaction that required hospitalization No Has patient had a PCN reaction occurring within the last 10 years: Yes  If all of the above answers are "NO", then may proceed with Cephalosporin use.   . Buprenorphine Hcl Rash  . Encainide Nausea And Vomiting  . Metoclopramide Other (See Comments)    Dystonia, muscle rigidity.    Discharge Instructions    Diet Carb Modified   Complete by:  As directed    Discharge instructions   Complete by:  As directed    It is important that you read following instructions as well as go over your medication list with RN to help you understand your care after this  hospitalization.  Discharge Instructions: Please follow-up with PCP in one week  Please request your primary care physician to go over all Hospital Tests and Procedure/Radiological results at the follow up,  Please get all Hospital records sent to your PCP by signing hospital release before you go home.   Do not take more than prescribed Pain, Sleep and Anxiety Medications. You were cared for by a hospitalist during your hospital stay. If you have any questions about your discharge medications or the care you received while you were in the hospital after you are discharged, you can call the unit and ask to speak with the hospitalist on call if the hospitalist that took care of you is not available.  Once you are discharged, your primary care physician will handle any further medical issues. Please note that NO REFILLS for any discharge medications will be authorized once you are discharged, as it is imperative that you return to your primary care physician (or establish a relationship with a primary care physician if you do not have one) for your aftercare needs so that they can reassess your need for medications and monitor your lab values. You Must read complete instructions/literature along with all the possible adverse reactions/side effects for all the Medicines you take and that have been prescribed to you. Take any new Medicines after you have completely understood and accept all the possible adverse reactions/side effects. Wear Seat belts while driving. If you have smoked or chewed Tobacco in the last 2 yrs please stop smoking and/or stop any Recreational drug use.   Increase activity slowly   Complete by:  As directed      Discharge Exam: Filed Weights   12/10/17 1330 12/10/17 2000  Weight: 68 kg (150 lb) 65 kg (143 lb 4.8 oz)   Vitals:   12/14/17 1420 12/14/17 1900  BP: 137/75 (!) 123/58  Pulse: 90 80  Resp: 18 18  Temp: 98.3 F (36.8 C) 98.2 F (36.8 C)  SpO2: 100% 100%    General: Appear in no distress, no Rash; Oral Mucosa moist. Cardiovascular: S1 and S2 Present, no Murmur, no JVD Respiratory: Bilateral Air entry present and Clear to Auscultation, no Crackles, no wheezes Abdomen: Bowel Sound present, Soft and no tenderness Extremities: no Pedal edema, no calf tenderness Neurology: Grossly no focal neuro deficit.no  The results of significant diagnostics from this hospitalization (including imaging, microbiology, ancillary and laboratory) are listed below for reference.    Significant Diagnostic Studies: Dg Chest 1 View  Result Date: 12/10/2017 CLINICAL DATA:  52 year old female with abdominal pain and vomiting. Diabetic ketoacidosis. Shortness of breath. EXAM: CHEST  1 VIEW COMPARISON:  Chest and  abdominal radiographs 11/22/2017 and earlier. FINDINGS: Portable AP semi upright view at 1811 hours. Lung volumes and mediastinal contours remain normal. Allowing for portable technique the lungs are clear. Visualized tracheal air column is within normal limits. Negative visible bowel gas pattern. No osseous abnormality identified. IMPRESSION: Negative.  No acute cardiopulmonary abnormality. Electronically Signed   By: Genevie Ann M.D.   On: 12/10/2017 18:34   Dg Abd Acute W/chest  Result Date: 11/22/2017 CLINICAL DATA:  Acute abdominal pain with nausea and vomiting. EXAM: DG ABDOMEN ACUTE W/ 1V CHEST COMPARISON:  11/05/2017 chest radiograph and prior studies. FINDINGS: Gas in the nondistended stomach and only a small amount of gas in a few small bowel loops identified. No dilated bowel loops or pneumoperitoneum identified. No suspicious calcifications noted. The cardiomediastinal silhouette is unremarkable. The lungs are clear. No airspace disease, pleural effusion or pneumothorax. No acute bony abnormality identified. IMPRESSION: 1. No evidence of acute abnormality. Electronically Signed   By: Margarette Canada M.D.   On: 11/22/2017 20:50   Dg Abd Portable 1v  Result Date:  12/13/2017 CLINICAL DATA:  Nausea.  History of diabetes.  GERD, hypertension. EXAM: PORTABLE ABDOMEN - 1 VIEW COMPARISON:  11/22/2017 FINDINGS: Bowel gas pattern is nonobstructive. Moderate stool burden. Surgical clips are seen in the RIGHT UPPER QUADRANT. No organomegaly or abnormal calcifications. Visualized osseous structures have a normal appearance. IMPRESSION: Negative. Electronically Signed   By: Nolon Nations M.D.   On: 12/13/2017 08:37   Ir Picc Placement Right >5 Yrs Inc Img Guide  Result Date: 12/13/2017 CLINICAL DATA:  Diabetic ketoacidosis, poor peripheral IV access EXAM: PICC PLACEMENT WITH ULTRASOUND AND FLUOROSCOPY FLUOROSCOPY TIME:  1.3 minutes; 196 uGym2 DAP TECHNIQUE: After written informed consent was obtained, patient was placed in the supine position on angiographic table. Patency of the right brachial vein was confirmed with ultrasound with image documentation. An appropriate skin site was determined. Skin site was marked. Region was prepped using maximum barrier technique including cap and mask, sterile gown, sterile gloves, large sterile sheet, and Chlorhexidine as cutaneous antisepsis. The region was infiltrated locally with 1% lidocaine. Under real-time ultrasound guidance, the right brachial vein was accessed with a 21 gauge micropuncture needle; the needle tip within the vein was confirmed with ultrasound image documentation. Needle exchanged over a 018 guidewire for a peel-away sheath, through which a 5-French double-lumen power injectable PICC trimmed to 30cm was advanced, positioned with its tip near the cavoatrial junction. There was focal resistance in the central subclavian vein, overcome by using 2 parallel 018 guidewires. Spot chest radiograph confirms appropriate catheter position. Catheter was flushed per protocol and secured externally. The patient tolerated procedure well. COMPLICATIONS: COMPLICATIONS none IMPRESSION: 1. Technically successful five [place] double lumen  power injectable PICC placement Electronically Signed   By: Lucrezia Europe M.D.   On: 12/13/2017 16:49    Microbiology: Recent Results (from the past 240 hour(s))  Urine culture     Status: Abnormal   Collection Time: 12/10/17  3:38 PM  Result Value Ref Range Status   Specimen Description   Final    URINE, CLEAN CATCH Performed at [facility] [address] [place], [place] [address]    Special Requests   Final    NONE Performed at [facility], [place] [address] [place], [place] [address]    Culture (A)  Final    <10,000 COLONIES/mL INSIGNIFICANT GROWTH Performed at [facility] [address] [place], [place] [address]  Report Status 12/11/2017 FINAL  Final  MRSA PCR Screening     Status: None   Collection Time: 12/11/17  6:22 AM  Result Value Ref Range Status   MRSA by PCR NEGATIVE NEGATIVE Final    Comment:        The GeneXpert MRSA Assay (FDA approved for NASAL specimens only), is one component of a comprehensive MRSA colonization surveillance program. It is not intended to diagnose MRSA infection nor to guide or monitor treatment for MRSA infections. Performed at [facility], [place] [address] [place], [place] [address]      Labs: CBC: Recent Labs  Lab 12/10/17 1404 12/11/17 0222  WBC 19.5* 14.5*  HGB 12.4 11.1*  HCT 39.9 34.3*  MCV 88.3 84.3  PLT 345 366   Basic Metabolic Panel: Recent Labs  Lab 12/13/17 0619 12/13/17 1141 12/13/17 1716 12/14/17 0400 12/15/17 0342  NA 138 141 138 136 140  K 3.7 2.9* 3.6 3.2* 3.8  CL 107 111 108 108 115*  CO2 14* 19* 22 21* 22  GLUCOSE 322* 93 299* 312* 151*  BUN 9 11 12 16 15   CREATININE 1.04* 1.32* 1.69* 1.72* 1.29*  CALCIUM 8.8* 8.9 8.6* 8.5* 8.4*   Liver Function Tests: Recent Labs  Lab 12/10/17 1404  AST 29  ALT 23  ALKPHOS 104  BILITOT 1.4*  PROT 8.2*  ALBUMIN 4.4   Recent Labs  Lab 12/10/17 1404 12/13/17 1141  LIPASE 26 27    CBG: Recent Labs  Lab 12/14/17 1232 12/14/17 1635 12/14/17 2136 12/15/17 0754 12/15/17 1157  GLUCAP 98 271* 228* 378* 216*   Time spent: 35 minutes  Signed:  Berle Mull  [facility]  12/15/2017  , 1:32 PM

## 2017-12-16 ENCOUNTER — Telehealth: Payer: Self-pay

## 2017-12-16 LAB — GLUCOSE, CAPILLARY: GLUCOSE-CAPILLARY: 30 mg/dL — AB (ref 65–99)

## 2017-12-16 NOTE — Telephone Encounter (Signed)
Transition Care Management Follow-up Telephone Call  This CM spoke to the patient and completed the call. She was discharged 12/15/17 after being treated for DKA, diabetic gastroparesis.     How have you been since you were released from the hospital?  She said that today she is feeling ' okay."  No nausea, vomiting reported since coming home.  She was eating at the time of CM call. She reported that her blood sugar this morning was 176 and she has been checking it as ordered.   Do you understand why you were in the hospital? yes  Do you have a copy of your discharge instructions  - yes but they were not in front of her when this CM called but she said that she did not have any questions.  Do you understand the discharge instrcutions?  Yes   Where were you discharged to?  - home , living with her sister Do you have support at home?  Yes, her sister   Items Reviewed:  Medications obtained yes - she said that she has all medications, nothing new.   Medications reviewed:She said that she did not need to review and did not have any questions. She said that she is aware that she needs to stop the macrobid.  Home Health? She said that the nurse from [facility] called her prior to her hospitalization and was aware that she was hospitalized. She said that the nurse told her to call when she got home. The patient said that she needs to call her,   DME ordered at discharge obtained? Nothing new ordered.  Medical supplies: patient already has a glucometer and understands how to use it    Functional Questionnaire:    Assistance with the following: independent with ADLs   Any transportation issues/concerns?: She said that she was approved for [facility]  Any patient concerns? No concerns at this time.   Confirmed importance and date/time of follow-up visits scheduled with PCP: yes.  She was offered an appointment sooner than 12/30/17 but she did not want to change that appointment.   Confirm appointment  scheduled with specialist - she needs to confirm and arrange transportation.    Confirmed with patient if condition begins to worsen call PCP or If it's emergency go to the ER.   - yes.

## 2017-12-19 ENCOUNTER — Emergency Department (HOSPITAL_COMMUNITY)
Admission: EM | Admit: 2017-12-19 | Discharge: 2017-12-20 | Disposition: A | Discharge status: Home / Self Care | Payer: Medicare Other | Attending: Emergency Medicine | Admitting: Emergency Medicine

## 2017-12-19 DIAGNOSIS — I10 Essential (primary) hypertension: Secondary | ICD-10-CM | POA: Diagnosis not present

## 2017-12-19 DIAGNOSIS — K3184 Gastroparesis: Secondary | ICD-10-CM | POA: Diagnosis not present

## 2017-12-19 DIAGNOSIS — Z79899 Other long term (current) drug therapy: Secondary | ICD-10-CM | POA: Insufficient documentation

## 2017-12-19 DIAGNOSIS — R110 Nausea: Secondary | ICD-10-CM | POA: Diagnosis not present

## 2017-12-19 DIAGNOSIS — E109 Type 1 diabetes mellitus without complications: Secondary | ICD-10-CM | POA: Diagnosis not present

## 2017-12-19 DIAGNOSIS — R1111 Vomiting without nausea: Secondary | ICD-10-CM | POA: Diagnosis not present

## 2017-12-19 DIAGNOSIS — R112 Nausea with vomiting, unspecified: Secondary | ICD-10-CM | POA: Diagnosis not present

## 2017-12-19 NOTE — ED Notes (Signed)
Bed: ZG01 Expected date:  Expected time:  Means of arrival:  Comments: EMS female "sick all day" vomiting

## 2017-12-19 NOTE — ED Triage Notes (Signed)
Pt comes to ed via ems, hx of gastroparesis, diabetes, nausea and vomiting.  V/s 165/82, pluses 110, cbg 172, spo2 98 room air. Comes from home. Pain 9 out 10.

## 2017-12-20 ENCOUNTER — Encounter (HOSPITAL_COMMUNITY): Payer: Self-pay | Admitting: Emergency Medicine

## 2017-12-20 DIAGNOSIS — E1010 Type 1 diabetes mellitus with ketoacidosis without coma: Secondary | ICD-10-CM | POA: Diagnosis not present

## 2017-12-20 DIAGNOSIS — E860 Dehydration: Secondary | ICD-10-CM | POA: Diagnosis not present

## 2017-12-20 DIAGNOSIS — E1043 Type 1 diabetes mellitus with diabetic autonomic (poly)neuropathy: Secondary | ICD-10-CM | POA: Diagnosis not present

## 2017-12-20 DIAGNOSIS — E1310 Other specified diabetes mellitus with ketoacidosis without coma: Secondary | ICD-10-CM | POA: Diagnosis not present

## 2017-12-20 DIAGNOSIS — Z452 Encounter for adjustment and management of vascular access device: Secondary | ICD-10-CM | POA: Diagnosis not present

## 2017-12-20 DIAGNOSIS — I10 Essential (primary) hypertension: Secondary | ICD-10-CM | POA: Diagnosis not present

## 2017-12-20 DIAGNOSIS — R109 Unspecified abdominal pain: Secondary | ICD-10-CM | POA: Diagnosis not present

## 2017-12-20 DIAGNOSIS — N179 Acute kidney failure, unspecified: Secondary | ICD-10-CM | POA: Diagnosis not present

## 2017-12-20 DIAGNOSIS — E873 Alkalosis: Secondary | ICD-10-CM | POA: Diagnosis not present

## 2017-12-20 LAB — I-STAT CHEM 8, ED
BUN: 9 mg/dL (ref 6–20)
CALCIUM ION: 1.08 mmol/L — AB (ref 1.15–1.40)
CREATININE: 0.9 mg/dL (ref 0.44–1.00)
Chloride: 102 mmol/L (ref 98–111)
Glucose, Bld: 297 mg/dL — ABNORMAL HIGH (ref 70–99)
HEMATOCRIT: 36 % (ref 36.0–46.0)
HEMOGLOBIN: 12.2 g/dL (ref 12.0–15.0)
Potassium: 3.9 mmol/L (ref 3.5–5.1)
SODIUM: 139 mmol/L (ref 135–145)
TCO2: 25 mmol/L (ref 22–32)

## 2017-12-20 LAB — CBG MONITORING, ED: Glucose-Capillary: 301 mg/dL — ABNORMAL HIGH (ref 70–99)

## 2017-12-20 LAB — I-STAT BETA HCG BLOOD, ED (MC, WL, AP ONLY)

## 2017-12-20 MED ORDER — ONDANSETRON 4 MG PO TBDP
4.0000 mg | ORAL_TABLET | Freq: Once | ORAL | Status: AC
  Administered 2017-12-20: 4 mg via ORAL
  Filled 2017-12-20: qty 1

## 2017-12-20 MED ORDER — HALOPERIDOL LACTATE 5 MG/ML IJ SOLN
5.0000 mg | Freq: Once | INTRAMUSCULAR | Status: AC
  Administered 2017-12-20: 5 mg via INTRAMUSCULAR
  Filled 2017-12-20: qty 1

## 2017-12-20 MED ORDER — ONDANSETRON 4 MG PO TBDP: 4.0000 mg | ORAL_TABLET | Freq: Three times a day (TID) | ORAL | 0 refills | Status: DC | PRN

## 2017-12-20 MED ORDER — LACTATED RINGERS IV BOLUS: 1000.0000 mL | Freq: Once | INTRAVENOUS | Status: DC

## 2017-12-20 MED ORDER — PROMETHAZINE HCL 25 MG RE SUPP: 25.0000 mg | Freq: Four times a day (QID) | RECTAL | 0 refills | Status: DC | PRN

## 2017-12-20 MED ORDER — ONDANSETRON HCL 4 MG/2ML IJ SOLN: 4.0000 mg | Freq: Once | INTRAMUSCULAR | Status: DC | PRN

## 2017-12-20 NOTE — ED Notes (Signed)
Gi assessment done 3 am

## 2017-12-20 NOTE — Discharge Instructions (Addendum)
We saw in the ER for nausea, vomiting and abdominal pain. It appears that you had gastroparesis flareup, fortunately we were able to get your symptoms and better control.  We recommend clear liquid diet for the next 24 hours. Check your blood sugars more frequently, and take your medications as prescribed.  Please return to the ER if your symptoms worsen; you have increased pain, fevers, chills, inability to keep any medications down, confusion. Otherwise see the outpatient doctor as requested.

## 2017-12-20 NOTE — ED Provider Notes (Signed)
[facility] DEPT Provider Note   CSN: [id number] Arrival date & time: 12/19/17  2353     History   Chief Complaint Chief Complaint  Patient presents with  . Nausea  . Emesis    HPI Khaleesi Matlin is a 52 y.o. female.  HPI  51 year old female comes in with chief complaint of nausea and vomiting.  Patient has history of diabetes and gastroparesis.  Patient states that around 8 PM, she suddenly started having nausea with vomiting and abdominal discomfort.  Patient's symptoms are similar to her gastroparesis with the abdominal pain being epigastric in nature, sharp and throbbing.  Patient has had about 5 episodes of emesis that has been nonbloody and nonbilious.  Patient has persistent nausea.  Past Medical History:  Diagnosis Date  . Depression   . Diabetes mellitus without complication ([place])   . Essential hypertension   . Gastroparesis   . GERD (gastroesophageal reflux disease)   . HLD (hyperlipidemia)     Patient Active Problem List   Diagnosis Date Noted  . Dehydration   . ARF (acute renal failure) ([place])   . Hyperkalemia 11/05/2017  . Constipation 08/01/2017  . MDD (major depressive disorder), single episode, severe , no psychosis ([place])   . DKA (diabetic ketoacidoses) ([place]) 06/16/2017  . Acute dystonic reaction due to drugs 04/01/2017  . Serotonin syndrome 03/25/2017  . Tardive dyskinesia   . Tachycardia 03/16/2017  . Diarrhea 03/05/2017  . DKA, type 1 ([place]) 03/18/2016  . Noncompliance with treatment plan 03/13/2016  . Essential hypertension 01/24/2016  . Leukocytosis 01/24/2016  . Abnormal ECG 11/27/2015  . HLD (hyperlipidemia)   . GERD (gastroesophageal reflux disease)   . AKI (acute kidney injury) ([place])   . Anemia, iron deficiency   . Vitamin B12 deficiency 08/16/2015  . Diabetic gastroparesis ([place])   . Diabetic neuropathy, type I diabetes mellitus ([place]) 05/18/2015  . Anxiety and depression 05/18/2015  . Nausea & vomiting      Past Surgical History:  Procedure Laterality Date  . COLONOSCOPY  09/27/2014   at [facility]  . ESOPHAGOGASTRODUODENOSCOPY  09/27/2014   at [facility], Dr Rolan Lipa. biospy neg for celiac, neg for H pylori.   . EYE SURGERY    . gailstones    . IR FLUORO GUIDE CV LINE RIGHT  02/01/2017  . IR FLUORO GUIDE CV LINE RIGHT  03/06/2017  . IR FLUORO GUIDE CV LINE RIGHT  03/25/2017  . IR GENERIC HISTORICAL  01/24/2016   IR FLUORO GUIDE CV LINE RIGHT 01/24/2016 Darrell K Allred, PA-C [facility]-INTERV RAD  . IR GENERIC HISTORICAL  01/24/2016   IR US GUIDE VASC ACCESS RIGHT 01/24/2016 Darrell K Allred, PA-C [facility]INTER[facility]  . IR US GUIDE VASC ACCESS RIGHT  02/01/2017  . IR US GUIDE VASC ACCESS RIGHT  03/06/2017  . IR US GUIDE VASC ACCESS RIGHT  03/25/2017  . POSTERIOR VITRECTOMY AND MEMBRANE PEEL-LEFT EYE  09/28/2002  . POSTERIOR VITRECTOMY AND MEMBRANE PEEL-RIGHT EYE  03/16/2002  . RETINAL DETACHMENT SURGERY       OB History   None      Home Medications    Prior to Admission medications   Medication Sig Start Date End Date Taking? Authorizing Provider  bisacodyl (DULCOLAX) 5 MG EC tablet Take 5 mg by mouth daily as needed for moderate constipation.    [provider]  busPIRone (BUSPAR) 10 MG tablet Take 1 tablet (10 mg total) by mouth 3 (three) times daily. 11/28/17   Newlin, [name],  MD  clonazePAM (KLONOPIN) 0.5 MG disintegrating tablet Take 1 tablet (0.5 mg total) by mouth 2 (two) times daily. 04/15/17   Gerlene Fee, NP  fluticasone (FLONASE) 50 MCG/ACT nasal spray Place 2 sprays into both nostrils daily. 11/12/17   Eugenie Filler, MD  furosemide (LASIX) 20 MG tablet Take 1 tablet (20 mg total) by mouth daily as needed for fluid or edema. 11/28/17   [name], MD  insulin aspart (NOVOLOG) 100 UNIT/ML injection Inject 0-12 Units into the skin 4 (four) times daily -  before meals and at bedtime. Uses a sliding scale: 70-119 = 0 units, 120-150 = 2 units, 151-180 =  4 units, 181-210 = 5 units,  211-240 = 6 units, 241-270 = 8 units, 271-300 = 9 units, 301-330 = 10 units, 331-360 = 11 units, 361-400 = 12 units, greater than 400 notify MD.    [provider]  insulin detemir (LEVEMIR) 100 UNIT/ML injection Inject 0.15 mLs (15 Units total) into the skin every morning. 11/28/17   [name], MD  loratadine (CLARITIN) 10 MG tablet Take 1 tablet (10 mg total) by mouth daily. 11/12/17   Eugenie Filler, MD  mirtazapine (REMERON) 15 MG tablet Take 1 tablet (15 mg total) by mouth at bedtime. 11/28/17   [name], MD  ondansetron (ZOFRAN-ODT) 8 MG disintegrating tablet Take 1 tablet (8 mg total) by mouth every 8 (eight) hours as needed for nausea or vomiting. 12/12/17   Lavina Hamman, MD  pantoprazole (PROTONIX) 40 MG tablet Take 1 tablet (40 mg total) by mouth daily. 11/28/17   [name], MD  polyethylene glycol (MIRALAX / GLYCOLAX) packet Take 17 g by mouth daily as needed for mild constipation.  04/11/17   [provider]  pregabalin (LYRICA) 150 MG capsule Take 1 capsule (150 mg total) by mouth daily. 10/16/17   [name], MD  pyridostigmine (MESTINON) 60 MG tablet Take 0.5 tablets (30 mg total) by mouth 3 (three) times daily. Take 1/2 with meals 11/11/17   Eugenie Filler, MD  Syringe/Needle, Disp, (SYRINGE 3CC/22GX1-1/2") 22G X 1-1/2" 3 ML MISC Inject 1,000 mcg into the muscle daily. 11/11/17   Eugenie Filler, MD  vitamin B-12 1000 MCG tablet Take 1 tablet (1,000 mcg total) by mouth daily. Patient not taking: Reported on 12/10/2017 11/28/17   [name], MD    Family History Family History  Problem Relation Age of Onset  . Cystic fibrosis Mother   . Hypertension Father   . Diabetes Brother   . Hypertension Maternal Grandmother     Social History Social History   Tobacco Use  . Smoking status: Never Smoker  . Smokeless tobacco: Never Used  Substance Use Topics  . Alcohol use: No  . Drug use: No     Allergies   Anesthetics, amide;  Penicillins; Buprenorphine hcl; Encainide; and Metoclopramide   Review of Systems Review of Systems  Constitutional: Positive for activity change.  Respiratory: Negative for shortness of breath.   Cardiovascular: Negative for chest pain.  Gastrointestinal: Positive for nausea and vomiting.  Genitourinary: Negative for dysuria.  All other systems reviewed and are negative.    Physical Exam Updated Vital Signs BP (!) 159/76 (BP Location: Left Arm)   Pulse 95   Temp 98.7 F (37.1 C) (Oral)   Resp 19   LMP  (LMP Unknown)   SpO2 99%   Physical Exam  Constitutional: She is oriented to person, place, and time. She appears well-developed.  HENT:  Head: Normocephalic and atraumatic.  Eyes: EOM are normal.  Neck: Normal range of motion. Neck supple.  Cardiovascular: Normal rate.  Pulmonary/Chest: Effort normal.  Abdominal: Bowel sounds are normal. There is tenderness. There is no rebound and no guarding.  Epigastric tenderness  Neurological: She is alert and oriented to person, place, and time.  Skin: Skin is warm and dry.  Nursing note and vitals reviewed.    ED Treatments / Results  Labs (all labs ordered are listed, but only abnormal results are displayed) Labs Reviewed  I-STAT CHEM 8, ED - Abnormal; Notable for the following components:      Result Value   Glucose, Bld 297 (*)    Calcium, Ion 1.08 (*)    All other components within normal limits  CBG MONITORING, ED - Abnormal; Notable for the following components:   Glucose-Capillary 301 (*)    All other components within normal limits  LIPASE, BLOOD  COMPREHENSIVE METABOLIC PANEL  CBC  URINALYSIS, ROUTINE W REFLEX MICROSCOPIC  BLOOD GAS, VENOUS  I-STAT BETA HCG BLOOD, ED ([facility], [facility], [facility] ONLY)  I-STAT CG4 LACTIC ACID, ED    EKG EKG Interpretation  Date/Time:  Friday December 20 2017 00:10:16 EDT Ventricular Rate:  94 PR Interval:    QRS Duration: 97 QT Interval:  368 QTC Calculation: 461 R Axis:   -50 Text  Interpretation:  Sinus rhythm Left anterior fascicular block Probable anteroseptal infarct, old No acute changes Nonspecific ST and T wave abnormality Confirmed by Varney Biles [phone]) on 12/20/2017 3:26:12 AM   Radiology No results found.  Procedures Procedures (including critical care time)  Medications Ordered in ED Medications  ondansetron (ZOFRAN) injection 4 mg (has no administration in time range)  lactated ringers bolus 1,000 mL (has no administration in time range)  haloperidol lactate (HALDOL) injection 5 mg (5 mg Intramuscular Given 12/20/17 0113)  ondansetron (ZOFRAN-ODT) disintegrating tablet 4 mg (4 mg Oral Given 12/20/17 0340)     Initial Impression / Assessment and Plan / ED Course  I have reviewed the triage vital signs and the nursing notes.  Pertinent labs & imaging results that were available during my care of the patient were reviewed by me and considered in my medical decision making (see chart for details).  Clinical Course as of Dec 20 352  Fri Dec 20, 2017  0230 Nurses, IV team were unable to place an IV.  Patient has declined an EJ.  I tried bedside ultrasound-guided IV myself and the IV blew.  Patient agreed to allowing [place] to getting an art stick to ensure she does not have significant electrolyte abnormalities or DKA.  Patient feels a lot comfortable right now and she has not had any emesis since she was given the Haldol shot.  Glucose-Capillary(!): 301 [AN]  [id number] Bicarb is 25.  Patient continues to do well.  P.o. challenge has been initiated.  Doubt DKA at this time, and if patient passes oral challenge then we will discharge her.  Glucose(!): 297 [AN]    Clinical Course User Index [AN] Varney Biles, MD    Pt comes in with cc of n/v/abd pain.  DDx includes: Pancreatitis Hepatobiliary pathology including cholecystitis Gastritis/PUD SBO  Pt comes in with cc of abd pain, nausea, emesis. She has hx of gastroparesis and appears to have had as  flair up. Pt doesn't appear dry. Concerns for DKA is low, but she has had DKA in the past.  Patient is a poor IV access.  I have ordered IM  Haldol and basic labs.   Final Clinical Impressions(s) / ED Diagnoses   Final diagnoses:  Gastroparesis    ED Discharge Orders    None       Varney Biles, MD 12/20/17 [phone]

## 2017-12-20 NOTE — ED Notes (Signed)
istat lactic d/c verbal by provider

## 2017-12-20 NOTE — ED Notes (Signed)
Provider notified iv start difficult  Provider to attempt

## 2017-12-20 NOTE — ED Notes (Signed)
Purewick is in place to collect urine sample.

## 2017-12-21 IMAGING — US IR FLUORO GUIDE CV LINE*R*
1 series · 1 of 1 positions shown · IV contrast (agent unspecified)
Comparison: none

INDICATION: DIABETES, DKA, ACCESS FOR IV THERAPY AND HYDRATION

EXAM:
ULTRASOUND AND FLUOROSCOPIC GUIDED PICC LINE INSERTION
MEDICATIONS:
1% PROCAINE LOCALLY
CONTRAST:  None
FLUOROSCOPY TIME:  Six seconds (less than 1 mGy)
COMPLICATIONS:
None immediate.
TECHNIQUE: The procedure, risks, benefits, and alternatives were explained to
the patient and informed written consent was obtained. A timeout was
performed prior to the initiation of the procedure.

[Series 1: ir fluoro/shunt/fist · 1 of 1 slices shown]
[im 1/1]
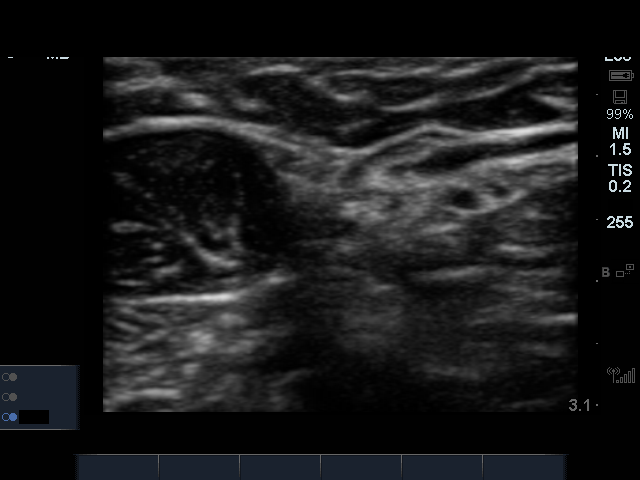

[1 of 1 positions shown; findings below may reference images not displayed]

The right upper extremity was prepped with chlorhexidine in a
sterile fashion, and a sterile drape was applied covering the
operative field. Maximum barrier sterile technique with sterile
gowns and gloves were used for the procedure. A timeout was
performed prior to the initiation of the procedure. Local anesthesia
was provided with 1% lidocaine.

Under direct ultrasound guidance, the right brachial vein was
accessed with a micropuncture kit after the overlying soft tissues
were anesthetized with 1% lidocaine. An ultrasound image was saved
for documentation purposes. A guidewire was advanced to the level of
the superior caval-atrial junction for measurement purposes and the
PICC line was cut to length. A peel-away sheath was placed and a 33
cm, 5 French, dual lumen was inserted to level of the superior
caval-atrial junction. A post procedure spot fluoroscopic was
obtained. The catheter easily aspirated and flushed and was sutured
in place. A dressing was placed. The patient tolerated the procedure
well without immediate post procedural complication.
FINDINGS: After catheter placement, the tip lies within the superior
cavoatrial junction. The catheter aspirates and flushes normally and
is ready for immediate use.
IMPRESSION: Successful ultrasound and fluoroscopic guided placement of a right
brachial vein approach, 33 cm, 5 French, dual lumen PICC with tip at
the superior caval-atrial junction. The PICC line is ready for
immediate use.

## 2017-12-21 IMAGING — XA IR FLUORO GUIDE CV LINE*R*
1 series · 1 of 1 positions shown · IV contrast (agent unspecified)
Comparison: none

INDICATION: DIABETES, DKA, ACCESS FOR IV THERAPY AND HYDRATION

EXAM:
ULTRASOUND AND FLUOROSCOPIC GUIDED PICC LINE INSERTION
MEDICATIONS:
1% PROCAINE LOCALLY
CONTRAST:  None
FLUOROSCOPY TIME:  Six seconds (less than 1 mGy)
COMPLICATIONS:
None immediate.
TECHNIQUE: The procedure, risks, benefits, and alternatives were explained to
the patient and informed written consent was obtained. A timeout was
performed prior to the initiation of the procedure.

[Series 300: line placements · 1 of 1 slices shown]
[im 1/1]
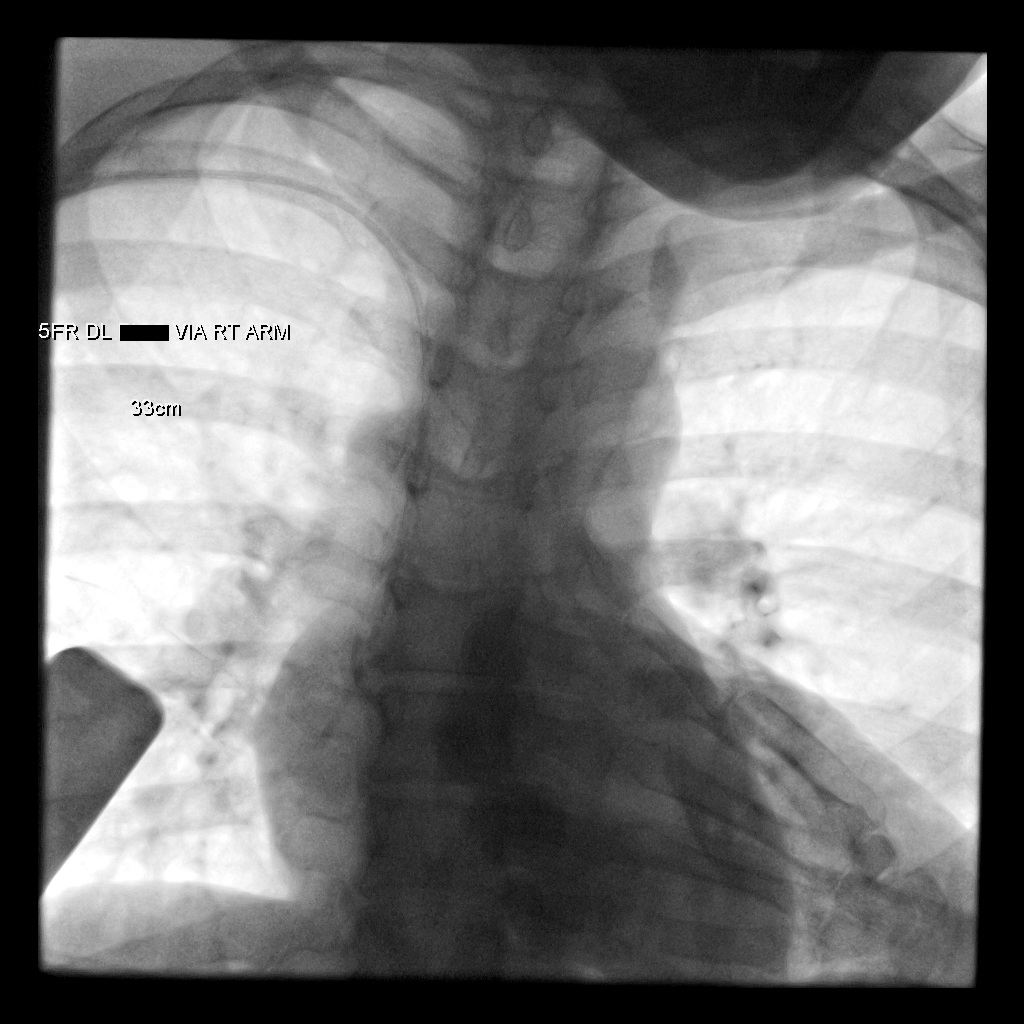

[1 of 1 positions shown; findings below may reference images not displayed]

The right upper extremity was prepped with chlorhexidine in a
sterile fashion, and a sterile drape was applied covering the
operative field. Maximum barrier sterile technique with sterile
gowns and gloves were used for the procedure. A timeout was
performed prior to the initiation of the procedure. Local anesthesia
was provided with 1% lidocaine.

Under direct ultrasound guidance, the right brachial vein was
accessed with a micropuncture kit after the overlying soft tissues
were anesthetized with 1% lidocaine. An ultrasound image was saved
for documentation purposes. A guidewire was advanced to the level of
the superior caval-atrial junction for measurement purposes and the
PICC line was cut to length. A peel-away sheath was placed and a 33
cm, 5 French, dual lumen was inserted to level of the superior
caval-atrial junction. A post procedure spot fluoroscopic was
obtained. The catheter easily aspirated and flushed and was sutured
in place. A dressing was placed. The patient tolerated the procedure
well without immediate post procedural complication.
FINDINGS: After catheter placement, the tip lies within the superior
cavoatrial junction. The catheter aspirates and flushes normally and
is ready for immediate use.
IMPRESSION: Successful ultrasound and fluoroscopic guided placement of a right
brachial vein approach, 33 cm, 5 French, dual lumen PICC with tip at
the superior caval-atrial junction. The PICC line is ready for
immediate use.

## 2017-12-22 IMAGING — DX DG CHEST 1V PORT
1 series · 1 of 1 positions shown · non-contrast
Comparison: 01/31/2017.

CLINICAL DATA: Cough.

EXAM:
PORTABLE CHEST 1 VIEW

[chest ap]
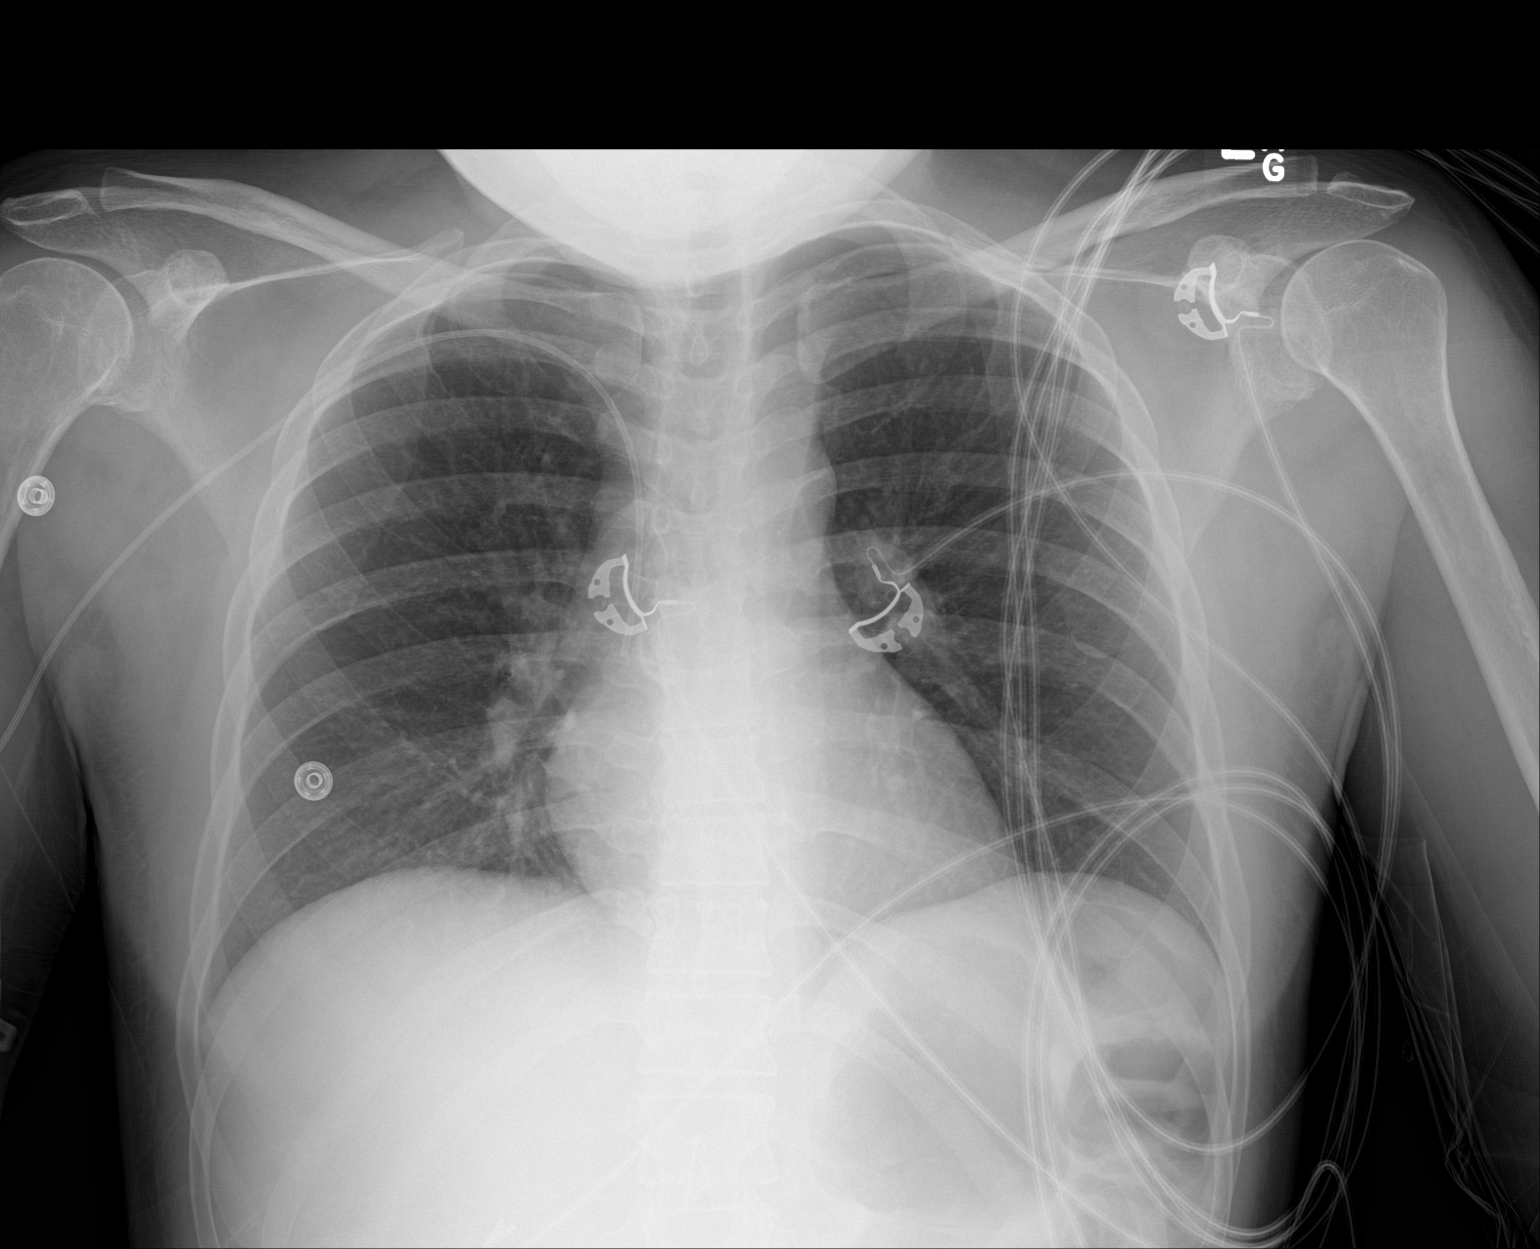

[1 of 1 positions shown; findings below may reference images not displayed]

FINDINGS: Right PICC line in stable position. Heart size normal. Mild right
base subsegmental atelectasis. No focal infiltrate. No pleural
effusion or pneumothorax. Thoracic spine scoliosis.
IMPRESSION: 1. Right PICC line stable position.

2.  Mild right base subsegmental atelectasis.

## 2017-12-23 ENCOUNTER — Encounter (HOSPITAL_COMMUNITY): Payer: Self-pay

## 2017-12-23 ENCOUNTER — Inpatient Hospital Stay (HOSPITAL_COMMUNITY): Payer: Medicare Other

## 2017-12-23 ENCOUNTER — Emergency Department (HOSPITAL_COMMUNITY): Payer: Medicare Other

## 2017-12-23 ENCOUNTER — Other Ambulatory Visit: Payer: Self-pay

## 2017-12-23 ENCOUNTER — Inpatient Hospital Stay (HOSPITAL_COMMUNITY)
Admission: EM | Admit: 2017-12-23 | Discharge: 2017-12-26 | DRG: 638 | Disposition: A | Discharge status: Home Health | Payer: Medicare Other | Attending: Internal Medicine | Admitting: Internal Medicine

## 2017-12-23 DIAGNOSIS — R109 Unspecified abdominal pain: Secondary | ICD-10-CM | POA: Diagnosis not present

## 2017-12-23 DIAGNOSIS — N179 Acute kidney failure, unspecified: Secondary | ICD-10-CM | POA: Diagnosis not present

## 2017-12-23 DIAGNOSIS — R339 Retention of urine, unspecified: Secondary | ICD-10-CM | POA: Diagnosis present

## 2017-12-23 DIAGNOSIS — E1143 Type 2 diabetes mellitus with diabetic autonomic (poly)neuropathy: Secondary | ICD-10-CM | POA: Diagnosis present

## 2017-12-23 DIAGNOSIS — E0910 Drug or chemical induced diabetes mellitus with ketoacidosis without coma: Secondary | ICD-10-CM | POA: Diagnosis not present

## 2017-12-23 DIAGNOSIS — E873 Alkalosis: Secondary | ICD-10-CM | POA: Diagnosis present

## 2017-12-23 DIAGNOSIS — E1043 Type 1 diabetes mellitus with diabetic autonomic (poly)neuropathy: Secondary | ICD-10-CM | POA: Diagnosis present

## 2017-12-23 DIAGNOSIS — E1010 Type 1 diabetes mellitus with ketoacidosis without coma: Secondary | ICD-10-CM | POA: Diagnosis not present

## 2017-12-23 DIAGNOSIS — R112 Nausea with vomiting, unspecified: Secondary | ICD-10-CM | POA: Diagnosis not present

## 2017-12-23 DIAGNOSIS — R52 Pain, unspecified: Secondary | ICD-10-CM | POA: Diagnosis not present

## 2017-12-23 DIAGNOSIS — F419 Anxiety disorder, unspecified: Secondary | ICD-10-CM | POA: Diagnosis present

## 2017-12-23 DIAGNOSIS — F329 Major depressive disorder, single episode, unspecified: Secondary | ICD-10-CM

## 2017-12-23 DIAGNOSIS — Z9114 Patient's other noncompliance with medication regimen: Secondary | ICD-10-CM | POA: Diagnosis not present

## 2017-12-23 DIAGNOSIS — E1310 Other specified diabetes mellitus with ketoacidosis without coma: Secondary | ICD-10-CM | POA: Diagnosis not present

## 2017-12-23 DIAGNOSIS — R1084 Generalized abdominal pain: Secondary | ICD-10-CM | POA: Diagnosis not present

## 2017-12-23 DIAGNOSIS — Z452 Encounter for adjustment and management of vascular access device: Secondary | ICD-10-CM | POA: Diagnosis not present

## 2017-12-23 DIAGNOSIS — R0602 Shortness of breath: Secondary | ICD-10-CM

## 2017-12-23 DIAGNOSIS — K219 Gastro-esophageal reflux disease without esophagitis: Secondary | ICD-10-CM | POA: Diagnosis present

## 2017-12-23 DIAGNOSIS — E1110 Type 2 diabetes mellitus with ketoacidosis without coma: Secondary | ICD-10-CM

## 2017-12-23 DIAGNOSIS — E785 Hyperlipidemia, unspecified: Secondary | ICD-10-CM | POA: Diagnosis present

## 2017-12-23 DIAGNOSIS — Z8639 Personal history of other endocrine, nutritional and metabolic disease: Secondary | ICD-10-CM

## 2017-12-23 DIAGNOSIS — I10 Essential (primary) hypertension: Secondary | ICD-10-CM | POA: Diagnosis not present

## 2017-12-23 DIAGNOSIS — R140 Abdominal distension (gaseous): Secondary | ICD-10-CM | POA: Diagnosis not present

## 2017-12-23 DIAGNOSIS — E1040 Type 1 diabetes mellitus with diabetic neuropathy, unspecified: Secondary | ICD-10-CM | POA: Diagnosis not present

## 2017-12-23 DIAGNOSIS — K3184 Gastroparesis: Secondary | ICD-10-CM | POA: Diagnosis not present

## 2017-12-23 DIAGNOSIS — E860 Dehydration: Secondary | ICD-10-CM | POA: Diagnosis present

## 2017-12-23 DIAGNOSIS — R1111 Vomiting without nausea: Secondary | ICD-10-CM | POA: Diagnosis not present

## 2017-12-23 DIAGNOSIS — F32A Depression, unspecified: Secondary | ICD-10-CM | POA: Diagnosis present

## 2017-12-23 DIAGNOSIS — R000 Tachycardia, unspecified: Secondary | ICD-10-CM | POA: Diagnosis not present

## 2017-12-23 DIAGNOSIS — G43A0 Cyclical vomiting, not intractable: Secondary | ICD-10-CM | POA: Diagnosis not present

## 2017-12-23 DIAGNOSIS — D72829 Elevated white blood cell count, unspecified: Secondary | ICD-10-CM | POA: Diagnosis present

## 2017-12-23 LAB — BASIC METABOLIC PANEL
BUN: 37 mg/dL — ABNORMAL HIGH (ref 6–20)
CALCIUM: 7.9 mg/dL — AB (ref 8.9–10.3)
CREATININE: 2.29 mg/dL — AB (ref 0.44–1.00)
Chloride: 98 mmol/L (ref 98–111)
GFR, EST AFRICAN AMERICAN: 27 mL/min — AB (ref >60)
GFR, EST NON AFRICAN AMERICAN: 24 mL/min — AB (ref >60)
Glucose, Bld: 689 mg/dL (ref 70–99)
Potassium: 4.4 mmol/L (ref 3.5–5.1)
SODIUM: 132 mmol/L — AB (ref 135–145)

## 2017-12-23 LAB — CBC
HCT: 34.5 % — ABNORMAL LOW (ref 36.0–46.0)
Hemoglobin: 10.2 g/dL — ABNORMAL LOW (ref 12.0–15.0)
MCH: 27.5 pg (ref 26.0–34.0)
MCHC: 29.6 g/dL — ABNORMAL LOW (ref 30.0–36.0)
MCV: 93 fL (ref 78.0–100.0)
PLATELETS: 553 10*3/uL — AB (ref 150–400)
RBC: 3.71 MIL/uL — AB (ref 3.87–5.11)
RDW: 16.8 % — ABNORMAL HIGH (ref 11.5–15.5)
WBC: 28.6 10*3/uL — AB (ref 4.0–10.5)

## 2017-12-23 LAB — CBG MONITORING, ED: Glucose-Capillary: >600 mg/dL (ref 70–99)

## 2017-12-23 LAB — BLOOD GAS, VENOUS
ACID-BASE DEFICIT: 24 mmol/L — AB (ref 0.0–2.0)
BICARBONATE: 5.2 mmol/L — AB (ref 20.0–28.0)
O2 SAT: 73.8 %
O2 Saturation: 94.3 %
O2 Saturation: 76.4 %
PATIENT TEMPERATURE: 98.6
PATIENT TEMPERATURE: 98.6
PH VEN: 7.077 — AB (ref 7.250–7.430)
PO2 VEN: 103 mmHg — AB (ref 32.0–45.0)
PO2 VEN: 46.9 mmHg — AB (ref 32.0–45.0)
Patient temperature: 98.6
pCO2, Ven: 19.5 mmHg — CL (ref 44.0–60.0)
pH, Ven: 6.948 — CL (ref 7.250–7.430)
pH, Ven: 7.051 — CL (ref 7.250–7.430)
pO2, Ven: 58.6 mmHg — ABNORMAL HIGH (ref 32.0–45.0)

## 2017-12-23 LAB — I-STAT CHEM 8, ED
BUN: 25 mg/dL — ABNORMAL HIGH (ref 6–20)
CALCIUM ION: 1.15 mmol/L (ref 1.15–1.40)
Chloride: 102 mmol/L (ref 98–111)
Creatinine, Ser: 1.3 mg/dL — ABNORMAL HIGH (ref 0.44–1.00)
HEMATOCRIT: 36 % (ref 36.0–46.0)
Hemoglobin: 12.2 g/dL (ref 12.0–15.0)
POTASSIUM: 5.2 mmol/L — AB (ref 3.5–5.1)
SODIUM: 131 mmol/L — AB (ref 135–145)
TCO2: 7 mmol/L — ABNORMAL LOW (ref 22–32)

## 2017-12-23 LAB — CBC WITH DIFFERENTIAL/PLATELET
BASOS ABS: 0 10*3/uL (ref 0.0–0.1)
Basophils Relative: 0 %
EOS PCT: 0 %
Eosinophils Absolute: 0 10*3/uL (ref 0.0–0.7)
HEMATOCRIT: 36.5 % (ref 36.0–46.0)
Hemoglobin: 11.1 g/dL — ABNORMAL LOW (ref 12.0–15.0)
LYMPHS ABS: 1.4 10*3/uL (ref 0.7–4.0)
LYMPHS PCT: 7 %
MCH: 27.7 pg (ref 26.0–34.0)
MCHC: 30.4 g/dL (ref 30.0–36.0)
MCV: 91 fL (ref 78.0–100.0)
MONOS PCT: 3 %
Monocytes Absolute: 0.6 10*3/uL (ref 0.1–1.0)
NEUTROS ABS: 17.3 10*3/uL — AB (ref 1.7–7.7)
Neutrophils Relative %: 90 %
PLATELETS: 538 10*3/uL — AB (ref 150–400)
RBC: 4.01 MIL/uL (ref 3.87–5.11)
RDW: 16.7 % — AB (ref 11.5–15.5)
WBC: 19.3 10*3/uL — ABNORMAL HIGH (ref 4.0–10.5)

## 2017-12-23 LAB — URINALYSIS, ROUTINE W REFLEX MICROSCOPIC
BILIRUBIN URINE: NEGATIVE
Bacteria, UA: NONE SEEN
Glucose, UA: >=500 mg/dL — AB
KETONES UR: 80 mg/dL — AB
Leukocytes, UA: NEGATIVE
NITRITE: NEGATIVE
PH: 5 (ref 5.0–8.0)
Protein, ur: 30 mg/dL — AB
SPECIFIC GRAVITY, URINE: 1.018 (ref 1.005–1.030)

## 2017-12-23 LAB — COMPREHENSIVE METABOLIC PANEL
ALBUMIN: 3.9 g/dL (ref 3.5–5.0)
ALT: 17 U/L (ref 0–44)
AST: 20 U/L (ref 15–41)
Alkaline Phosphatase: 89 U/L (ref 38–126)
BUN: 31 mg/dL — AB (ref 6–20)
CHLORIDE: 96 mmol/L — AB (ref 98–111)
CREATININE: 1.91 mg/dL — AB (ref 0.44–1.00)
Calcium: 9 mg/dL (ref 8.9–10.3)
GFR calc Af Amer: 34 mL/min — ABNORMAL LOW (ref >60)
GFR calc non Af Amer: 30 mL/min — ABNORMAL LOW (ref >60)
GLUCOSE: 797 mg/dL — AB (ref 70–99)
Potassium: 5.3 mmol/L — ABNORMAL HIGH (ref 3.5–5.1)
SODIUM: 133 mmol/L — AB (ref 135–145)
Total Bilirubin: 2.5 mg/dL — ABNORMAL HIGH (ref 0.3–1.2)
Total Protein: 7.5 g/dL (ref 6.5–8.1)

## 2017-12-23 LAB — GLUCOSE, CAPILLARY
Glucose-Capillary: 377 mg/dL — ABNORMAL HIGH (ref 70–99)
Glucose-Capillary: 489 mg/dL — ABNORMAL HIGH (ref 70–99)
Glucose-Capillary: 537 mg/dL (ref 70–99)
Glucose-Capillary: 587 mg/dL (ref 70–99)
Glucose-Capillary: >600 mg/dL (ref 70–99)

## 2017-12-23 LAB — I-STAT BETA HCG BLOOD, ED (MC, WL, AP ONLY): I-stat hCG, quantitative: <5 m[IU]/mL (ref <5)

## 2017-12-23 LAB — LIPASE, BLOOD: Lipase: 24 U/L (ref 11–51)

## 2017-12-23 LAB — MAGNESIUM: Magnesium: 2.3 mg/dL (ref 1.7–2.4)

## 2017-12-23 MED ORDER — SODIUM CHLORIDE 0.9 % IV BOLUS
500.0000 mL | Freq: Once | INTRAVENOUS | Status: AC
  Administered 2017-12-23: 500 mL via INTRAVENOUS

## 2017-12-23 MED ORDER — LIDOCAINE HCL (PF) 1 % IJ SOLN
INTRAMUSCULAR | Status: AC | PRN
  Administered 2017-12-23: 10 mL

## 2017-12-23 MED ORDER — DEXTROSE-NACL 5-0.45 % IV SOLN
INTRAVENOUS | Status: DC
  Administered 2017-12-24: 02:00:00 via INTRAVENOUS

## 2017-12-23 MED ORDER — SODIUM CHLORIDE 0.9% FLUSH
10.0000 mL | Freq: Two times a day (BID) | INTRAVENOUS | Status: DC
  Administered 2017-12-23 – 2017-12-25 (×4): 10 mL

## 2017-12-23 MED ORDER — CHLORHEXIDINE GLUCONATE CLOTH 2 % EX PADS
6.0000 | MEDICATED_PAD | Freq: Every day | CUTANEOUS | Status: DC
  Administered 2017-12-24 – 2017-12-26 (×3): 6 via TOPICAL

## 2017-12-23 MED ORDER — SODIUM CHLORIDE 0.9 % IV SOLN
INTRAVENOUS | Status: DC
  Administered 2017-12-23 (×2): via INTRAVENOUS

## 2017-12-23 MED ORDER — HEPARIN SODIUM (PORCINE) 5000 UNIT/ML IJ SOLN
5000.0000 [IU] | Freq: Three times a day (TID) | INTRAMUSCULAR | Status: DC
  Administered 2017-12-24 – 2017-12-26 (×5): 5000 [IU] via SUBCUTANEOUS
  Filled 2017-12-23 (×5): qty 1

## 2017-12-23 MED ORDER — SODIUM CHLORIDE 0.9 % IV SOLN
INTRAVENOUS | Status: DC
  Administered 2017-12-23 (×2): via INTRAVENOUS

## 2017-12-23 MED ORDER — ACETAMINOPHEN 325 MG PO TABS: 650.0000 mg | ORAL_TABLET | ORAL | Status: DC | PRN

## 2017-12-23 MED ORDER — KETOROLAC TROMETHAMINE 30 MG/ML IJ SOLN
30.0000 mg | Freq: Once | INTRAMUSCULAR | Status: AC
  Administered 2017-12-23: 30 mg via INTRAVENOUS
  Filled 2017-12-23: qty 1

## 2017-12-23 MED ORDER — DEXTROSE-NACL 5-0.45 % IV SOLN: INTRAVENOUS | Status: DC

## 2017-12-23 MED ORDER — SODIUM CHLORIDE 0.45 % IV SOLN: INTRAVENOUS | Status: DC

## 2017-12-23 MED ORDER — SODIUM CHLORIDE 0.9 % IV SOLN
INTRAVENOUS | Status: DC
  Administered 2017-12-23: 5.4 [IU]/h via INTRAVENOUS
  Filled 2017-12-23: qty 1

## 2017-12-23 MED ORDER — SODIUM CHLORIDE 0.45 % IV SOLN
INTRAVENOUS | Status: DC
  Administered 2017-12-23: 20:00:00 via INTRAVENOUS
  Filled 2017-12-23 (×2): qty 100

## 2017-12-23 MED ORDER — SODIUM CHLORIDE 0.9 % IV BOLUS
1000.0000 mL | Freq: Once | INTRAVENOUS | Status: AC
Start: 2017-12-23 — End: 2017-12-23
  Administered 2017-12-23: 1000 mL via INTRAVENOUS

## 2017-12-23 MED ORDER — LIDOCAINE HCL 1 % IJ SOLN
INTRAMUSCULAR | Status: AC
  Administered 2017-12-23: 18:00:00
  Filled 2017-12-23: qty 20

## 2017-12-23 MED ORDER — LORAZEPAM 2 MG/ML IJ SOLN
1.0000 mg | Freq: Once | INTRAMUSCULAR | Status: AC
  Administered 2017-12-23: 1 mg via INTRAVENOUS
  Filled 2017-12-23: qty 1

## 2017-12-23 MED ORDER — SODIUM CHLORIDE 0.9 % IV SOLN
INTRAVENOUS | Status: DC
  Administered 2017-12-23: 25.7 [IU]/h via INTRAVENOUS
  Administered 2017-12-23: 16.2 [IU]/h via INTRAVENOUS
  Administered 2017-12-24: 11.9 [IU]/h via INTRAVENOUS
  Filled 2017-12-23 (×3): qty 1

## 2017-12-23 MED ORDER — SODIUM CHLORIDE 0.9% FLUSH
10.0000 mL | INTRAVENOUS | Status: DC | PRN
  Administered 2017-12-25: 10 mL
  Filled 2017-12-23: qty 40

## 2017-12-23 MED ORDER — SODIUM CHLORIDE 0.9 % IV SOLN: INTRAVENOUS | Status: AC

## 2017-12-23 NOTE — ED Notes (Signed)
Pt continues to ask for water and ice, asking random staff members. Pt reminded that she cannot have water.

## 2017-12-23 NOTE — ED Notes (Signed)
Pt attempting to climb out of bed, stating she is uncomfortable. Pt repositioned.

## 2017-12-23 NOTE — ED Notes (Signed)
Pt allowed ice chips per Dr Reesa Chew

## 2017-12-23 NOTE — ED Triage Notes (Addendum)
Nausea and emesis since 1100 6/30. C/o generalized abd pain. 104/58 HR 110, RR 20, 100% RA, [id number]. Pt has not had phenergan or zofran rx filled from last time per EMS- pt states she took her sisters. Pt states she did not take insulin this AM because she wasn't feeling well.

## 2017-12-23 NOTE — Progress Notes (Signed)
RT discussed with MD Zenia Resides about Po2 result on VBG (po2= 103)- concern for arterial placement versus vein.

## 2017-12-23 NOTE — ED Notes (Signed)
Pt attempting to get out of bed, pt repositioned. Pt reminded that she stated she was weak, and unable to move self up in the bed.

## 2017-12-23 NOTE — ED Provider Notes (Signed)
Medical screening examination/treatment/procedure(s) were conducted as a shared visit with non-physician practitioner(s) and myself.  I personally evaluated the patient during the encounter.  None 52 year old female here with emesis and abdominal discomfort.  Has evidence of DKA at this time.  Called to bedside also due to concern for possible arterial placement of external jugular catheter by nursing.  Patient had a PO2 from the blood draw from the site that was above 100.  Blood was withdrawn back from the catheter site it was bright red.  Had nursing immediately remove this catheter.  Will consult IR for PICC line placement.   Lacretia Leigh, MD 12/23/17 1218

## 2017-12-23 NOTE — ED Notes (Signed)
Pt transported to IR with transporter

## 2017-12-23 NOTE — ED Notes (Signed)
Pt again climbing out of bed. Pt reminded of importance of staying in bed and waiting for IR.

## 2017-12-23 NOTE — ED Provider Notes (Signed)
[facility] DEPT Provider Note   CSN: [id number] Arrival date & time: 12/23/17  0950     History   Chief Complaint Chief Complaint  Patient presents with  . Emesis  . Abdominal Pain    HPI [name] is a 52 y.o. female with a insulin-dependent type 1 diabetes, gastroparesis, hypertension, hyperlipidemia who presents emergency department today for hyperglycemia, abdominal pain, and N/V.  Patient states that she started having a gastroparesis flare with generalized abdominal pain, nausea, vomiting and dry mouth that began last night.  Patient reports that she took Zofran and Phenergan for her symptoms yesterday without any relief.  No interventions today.  Patient notes that she last used her insulin yesterday morning.  Patient notes that her last episode of DKA was on 6/18 which required admission.  Patient endorses associated polydipsia and polyuria.  She denies any fever, chills, headache, cough, shortness of breath, chest pain, bilious/bloody emesis, diarrhea, dysuria, flank pain or hematuria.  Patient is on Levemir 15 units every morning and sliding scale insulin through the day.  She notes that her last A1c was 10.6 approximately 6 weeks ago.  Patient cannot tell me what her blood sugars usually run between at home.  HPI  Past Medical History:  Diagnosis Date  . Depression   . Diabetes mellitus without complication ([place])   . Essential hypertension   . Gastroparesis   . GERD (gastroesophageal reflux disease)   . HLD (hyperlipidemia)     Patient Active Problem List   Diagnosis Date Noted  . Dehydration   . ARF (acute renal failure) ([place])   . Hyperkalemia 11/05/2017  . Constipation 08/01/2017  . MDD (major depressive disorder), single episode, severe , no psychosis ([place])   . DKA (diabetic ketoacidoses) ([place]) 06/16/2017  . Acute dystonic reaction due to drugs 04/01/2017  . Serotonin syndrome 03/25/2017  . Tardive dyskinesia   . Tachycardia  03/16/2017  . Diarrhea 03/05/2017  . DKA, type 1 ([place]) 03/18/2016  . Noncompliance with treatment plan 03/13/2016  . Essential hypertension 01/24/2016  . Leukocytosis 01/24/2016  . Abnormal ECG 11/27/2015  . HLD (hyperlipidemia)   . GERD (gastroesophageal reflux disease)   . AKI (acute kidney injury) ([place])   . Anemia, iron deficiency   . Vitamin B12 deficiency 08/16/2015  . Diabetic gastroparesis ([place])   . Diabetic neuropathy, type I diabetes mellitus ([place]) 05/18/2015  . Anxiety and depression 05/18/2015  . Nausea & vomiting     Past Surgical History:  Procedure Laterality Date  . COLONOSCOPY  09/27/2014   at [facility]  . ESOPHAGOGASTRODUODENOSCOPY  09/27/2014   at [facility], Dr Rolan Lipa. biospy neg for celiac, neg for H pylori.   . EYE SURGERY    . gailstones    . IR FLUORO GUIDE CV LINE RIGHT  02/01/2017  . IR FLUORO GUIDE CV LINE RIGHT  03/06/2017  . IR FLUORO GUIDE CV LINE RIGHT  03/25/2017  . IR GENERIC HISTORICAL  01/24/2016   IR FLUORO GUIDE CV LINE RIGHT 01/24/2016 Darrell K Allred, PA-C [facility]-INTERV RAD  . IR GENERIC HISTORICAL  01/24/2016   IR US GUIDE VASC ACCESS RIGHT 01/24/2016 Darrell K Allred, PA-C [facility]INTER[facility]  . IR US GUIDE VASC ACCESS RIGHT  02/01/2017  . IR US GUIDE VASC ACCESS RIGHT  03/06/2017  . IR US GUIDE VASC ACCESS RIGHT  03/25/2017  . POSTERIOR VITRECTOMY AND MEMBRANE PEEL-LEFT EYE  09/28/2002  . POSTERIOR VITRECTOMY AND MEMBRANE PEEL-RIGHT EYE  03/16/2002  . RETINAL  DETACHMENT SURGERY       OB History   None      Home Medications    Prior to Admission medications   Medication Sig Start Date End Date Taking? Authorizing Provider  bisacodyl (DULCOLAX) 5 MG EC tablet Take 5 mg by mouth daily as needed for moderate constipation.    [provider]  busPIRone (BUSPAR) 10 MG tablet Take 1 tablet (10 mg total) by mouth 3 (three) times daily. 11/28/17   [name], MD  clonazePAM (KLONOPIN) 0.5 MG disintegrating tablet Take 1 tablet (0.5 mg total) by  mouth 2 (two) times daily. 04/15/17   Gerlene Fee, NP  fluticasone (FLONASE) 50 MCG/ACT nasal spray Place 2 sprays into both nostrils daily. 11/12/17   Eugenie Filler, MD  furosemide (LASIX) 20 MG tablet Take 1 tablet (20 mg total) by mouth daily as needed for fluid or edema. 11/28/17   [name], MD  insulin aspart (NOVOLOG) 100 UNIT/ML injection Inject 0-12 Units into the skin 4 (four) times daily -  before meals and at bedtime. Uses a sliding scale: 70-119 = 0 units, 120-150 = 2 units, 151-180 =  4 units, 181-210 = 5 units, 211-240 = 6 units, 241-270 = 8 units, 271-300 = 9 units, 301-330 = 10 units, 331-360 = 11 units, 361-400 = 12 units, greater than 400 notify MD.    [provider]  insulin detemir (LEVEMIR) 100 UNIT/ML injection Inject 0.15 mLs (15 Units total) into the skin every morning. 11/28/17   [name], MD  loratadine (CLARITIN) 10 MG tablet Take 1 tablet (10 mg total) by mouth daily. 11/12/17   Eugenie Filler, MD  mirtazapine (REMERON) 15 MG tablet Take 1 tablet (15 mg total) by mouth at bedtime. 11/28/17   [name], MD  ondansetron (ZOFRAN ODT) 4 MG disintegrating tablet Take 1 tablet (4 mg total) by mouth every 8 (eight) hours as needed for nausea or vomiting. 12/20/17   Varney Biles, MD  ondansetron (ZOFRAN-ODT) 8 MG disintegrating tablet Take 1 tablet (8 mg total) by mouth every 8 (eight) hours as needed for nausea or vomiting. 12/12/17   Lavina Hamman, MD  pantoprazole (PROTONIX) 40 MG tablet Take 1 tablet (40 mg total) by mouth daily. 11/28/17   [name], MD  polyethylene glycol (MIRALAX / GLYCOLAX) packet Take 17 g by mouth daily as needed for mild constipation.  04/11/17   [provider]  pregabalin (LYRICA) 150 MG capsule Take 1 capsule (150 mg total) by mouth daily. 10/16/17   [name], MD  promethazine (PHENERGAN) 25 MG suppository Place 1 suppository (25 mg total) rectally every 6 (six) hours as needed for nausea.  12/20/17   Varney Biles, MD  pyridostigmine (MESTINON) 60 MG tablet Take 0.5 tablets (30 mg total) by mouth 3 (three) times daily. Take 1/2 with meals 11/11/17   Eugenie Filler, MD  Syringe/Needle, Disp, (SYRINGE 3CC/22GX1-1/2") 22G X 1-1/2" 3 ML MISC Inject 1,000 mcg into the muscle daily. 11/11/17   Eugenie Filler, MD  vitamin B-12 1000 MCG tablet Take 1 tablet (1,000 mcg total) by mouth daily. Patient not taking: Reported on 12/10/2017 11/28/17   [name], MD    Family History Family History  Problem Relation Age of Onset  . Cystic fibrosis Mother   . Hypertension Father   . Diabetes Brother   . Hypertension Maternal Grandmother     Social History Social History   Tobacco Use  . Smoking status: Never  Smoker  . Smokeless tobacco: Never Used  Substance Use Topics  . Alcohol use: No  . Drug use: No     Allergies   Anesthetics, amide; Penicillins; Buprenorphine hcl; Encainide; and Metoclopramide   Review of Systems Review of Systems  All other systems reviewed and are negative.    Physical Exam Updated Vital Signs BP (!) 120/46 (BP Location: Left Arm)   Pulse (!) 111   Temp (!) 97.5 F (36.4 C) (Oral)   Resp (!) 30   Ht 5\' 1"  (1.549 m)   Wt 68 kg (150 lb)   LMP  (LMP Unknown)   SpO2 100%   BMI 28.34 kg/m   Physical Exam  Constitutional: She appears well-developed and well-nourished.  Mentating appropriately  HENT:  Head: Normocephalic and atraumatic.  Right Ear: External ear normal.  Left Ear: External ear normal.  Nose: Nose normal.  Mouth/Throat: Uvula is midline, oropharynx is clear and moist and mucous membranes are normal. No tonsillar exudate.  Eyes: Pupils are equal, round, and reactive to light. Right eye exhibits no discharge. Left eye exhibits no discharge. No scleral icterus.  Neck: Trachea normal. Neck supple. No spinous process tenderness present. No neck rigidity. Normal range of motion present.  Cardiovascular: Normal rate,  regular rhythm and intact distal pulses.  No murmur heard. Pulses:      Radial pulses are 2+ on the right side, and 2+ on the left side.       Dorsalis pedis pulses are 2+ on the right side, and 2+ on the left side.       Posterior tibial pulses are 2+ on the right side, and 2+ on the left side.  No lower extremity swelling or edema. Calves symmetric in size bilaterally.  Pulmonary/Chest: Effort normal and breath sounds normal. Tachypnea noted. She exhibits no tenderness.  Abdominal: Soft. Bowel sounds are normal. There is generalized tenderness. There is no rigidity, no rebound, no guarding and no CVA tenderness.  No suprapubic tenderness.  No CVA tenderness.  Negative Murphy sign or McBurney's point.   Musculoskeletal: She exhibits no edema.  Lymphadenopathy:    She has no cervical adenopathy.  Neurological: She is alert.  Skin: Skin is warm and dry. No rash noted. She is not diaphoretic.  Psychiatric: She has a normal mood and affect.  Nursing note and vitals reviewed.    ED Treatments / Results  Labs (all labs ordered are listed, but only abnormal results are displayed) Labs Reviewed  COMPREHENSIVE METABOLIC PANEL - Abnormal; Notable for the following components:      Result Value   Sodium 133 (*)    Potassium 5.3 (*)    Chloride 96 (*)    CO2 <7 (*)    Glucose, Bld 797 (*)    BUN 31 (*)    Creatinine, Ser 1.91 (*)    Total Bilirubin 2.5 (*)    GFR calc non Af Amer 30 (*)    GFR calc Af Amer 34 (*)    All other components within normal limits  CBC WITH DIFFERENTIAL/PLATELET - Abnormal; Notable for the following components:   WBC 19.3 (*)    Hemoglobin 11.1 (*)    RDW 16.7 (*)    Platelets 538 (*)    Neutro Abs 17.3 (*)    All other components within normal limits  URINALYSIS, ROUTINE W REFLEX MICROSCOPIC - Abnormal; Notable for the following components:   Glucose, UA >=500 (*)    Hgb urine dipstick SMALL (*)  Ketones, ur 80 (*)    Protein, ur 30 (*)    All other  components within normal limits  BLOOD GAS, VENOUS - Abnormal; Notable for the following components:   pH, Ven 7.077 (*)    pO2, Ven 103.0 (*)    All other components within normal limits  CBG MONITORING, ED - Abnormal; Notable for the following components:   Glucose-Capillary >600 (*)    All other components within normal limits  I-STAT CHEM 8, ED - Abnormal; Notable for the following components:   Sodium 131 (*)    Potassium 5.2 (*)    BUN 25 (*)    Creatinine, Ser 1.30 (*)    Glucose, Bld >700 (*)    TCO2 7 (*)    All other components within normal limits  URINE CULTURE  LIPASE, BLOOD  I-STAT BETA HCG BLOOD, ED ([facility], [facility], [facility] ONLY)    EKG None  Radiology No results found.  Procedures Procedures (including critical care time) CRITICAL CARE Performed by: Jillyn Ledger   Total critical care time: 45 minutes - DKA  Critical care time was exclusive of separately billable procedures and treating other patients.  Critical care was necessary to treat or prevent imminent or life-threatening deterioration.  Critical care was time spent personally by me on the following activities: development of treatment plan with patient and/or surrogate as well as nursing, discussions with consultants, evaluation of patient's response to treatment, examination of patient, obtaining history from patient or surrogate, ordering and performing treatments and interventions, ordering and review of laboratory studies, ordering and review of radiographic studies, pulse oximetry and re-evaluation of patient's condition.   Medications Ordered in ED Medications  insulin regular (NOVOLIN R,HUMULIN R) 100 Units in sodium chloride 0.9 % 100 mL (1 Units/mL) infusion (0 Units/hr Intravenous Hold 12/23/17 1115)  sodium chloride 0.9 % bolus 1,000 mL (0 mLs Intravenous Stopped 12/23/17 1217)    And  0.9 %  sodium chloride infusion ( Intravenous Stopped 12/23/17 1217)  dextrose 5 %-0.45 % sodium chloride infusion (  Intravenous Hold 12/23/17 1126)  sodium chloride 0.9 % bolus 500 mL (0 mLs Intravenous Stopped 12/23/17 1217)  LORazepam (ATIVAN) injection 1 mg (1 mg Intravenous Given 12/23/17 1138)     Initial Impression / Assessment and Plan / ED Course  I have reviewed the triage vital signs and the nursing notes.  Pertinent labs & imaging results that were available during my care of the patient were reviewed by me and considered in my medical decision making (see chart for details).     52 year old female with a history of insulin-dependent type 1 diabetes as well as gastroparesis who presents emergency department today for hyperglycemia & what appears to be a flare of her gastroparesis.  No infectious symptoms reported.  Suspect patient will be in DKA.  Awaiting VBG and Chem-8.  Blood work, EKG and IV fluids ordered.  Patient's abdominal exam without any peritoneal signs.  No focal tenderness.  Unsuccessful VBG. Will get ABG.  Patient with difficult access.  Chem-8 not crossing over. Na 131. Chloride 102. Bicarb 7. Anion gap calculated with gap. Patient with CBG >700.  Will start DKA protocol.  Will Potassium 5.2. No additional potassium ordered.    VBG with pH of 7.03.  Patient with noted acidosis that is consistent with DKA.  Patient's EJ was placed and removed.  Patient without able line by nursing staff or myself.  Ultrasound IV attempted by IV team, myself and nursing staff  without success.  Patient seen and evaluated by Dr. Zenia Resides who recommended PICC line.  Patient appears stable at this current time.  1:03 PM Dr. Zenia Resides spoke with interventional radiologist, Aletta Edouard, who will try to have the patients PICC line placed as early as possible. Estimates 3-4 pm.  Creatinine and BUN are elevated and consistent with an acute kidney injury.  Patient's creatinine on the 28th was 0.9 and and today has more than doubled.  Labs reviewed.  This shows patient is spilling glucose as well as ketones into her  urine.  There is no evidence of UTI.  Patient with leukocytosis of 19.3.  Baseline anemia.  3:49 PM Patient has left to get PICC line. Appears stable at this time.  IV fluids, insulin are ordered and pending.    Case signed out to signed out to oncoming PA, [employer].  Plan for patient to receive IV fluid, IV insulin upon return from placement of PICC line and admit to hospitalist service for DKA.  Final Clinical Impressions(s) / ED Diagnoses   Final diagnoses:  DKA (diabetic ketoacidoses) ([place])  History of diabetic gastroparesis  Acute kidney injury [facility])    ED Discharge Orders    None       Lorelle Gibbs 12/23/17 1615    Lacretia Leigh, MD 12/24/17 [phone]

## 2017-12-23 NOTE — Progress Notes (Signed)
VBG results hand delivered by this RT at 1128 on 12/23/17 to Maczis, PA.  PH 7.077 Pco2- below reportable range p02 103 No Hco3 due to below reportable range on Pc02.  RN also aware of results.

## 2017-12-23 NOTE — ED Notes (Signed)
Pt attempting to get out of bed, stating that she does not care that she is NPO. Pt provided water as she states she does not care if it delays her treatment

## 2017-12-23 NOTE — ED Provider Notes (Signed)
16:00: Assumed care at change of shift from [facility]  Please see prior provider note for full H&P.  Briefly patient is a 52 year old female with a hx of T1DM, gastroparesis, and HTN who presented to the ED in what appears to be acute DKA. Patient with hyperglycemia, abdominal pain, and N/V similar to previous gastroparesis flares and DKA. Last used insulin yesterday morning. No reported concerning infectious type sxs.  She did have recent admission for similar 12/10/17. Today patient is acidotic with pH of 7.077, anion gap elevated, glucose 797, Potassium 5.3. Her presentation feels similar to previous DKA/gastroparesis problems per the patient.   There has been difficulty with IV access with multiple attempts. Patient requiring PICC line. Patient's medications have been ordered by prior provider, pending PICC line placement by IR. Plan for admission once IV access, fluids, and insulin have been started.   16:00 Patient with IR for PICC line placement.   17:35: RE-EVAL: Patient has PICC in place, IVF started, insulin being started now. On my exam mild diffuse abdominal tenderness, non focal, no peritoneal signs, no active emesis. She is tachycardic and somewhat tachypneic, SpO2 remaining > 92%.  Alert and oriented. Discussed plan for fluids, insulin, and admission with the patient who confirmed understanding and is in agreement.   17:50: CONSULT: Discussed case with hospitalist Dr. Reesa Chew, requesting bicarb drip be started and that we consult PCCM to make them aware of the patient and obtain any further recommendations, he will admit the patient.   17:55: CONSULT: Dr. Reesa Chew called back, given labs from this AM would prefer repeat labs prior to decision regarding bicarb drip. Requesting stat labs, I have relayed this to RN Megan caring for this patient. Consult placed to PCCM.   18:01: CONSULT: Discussed with intensivist Dr. Pearline Cables, requesting Dr. Reesa Chew call him directly with new lab result information  for his recommendations.   18:30: Dr. Reesa Chew present in the ER evaluating patient, repeat VBG with worsened pH to 6.948, remaining labs pending, he has started bicarb drip, he is aware to contact Dr. Pearline Cables directly for recommendations. Patient has been admitted to hospitalist service for further care.   CRITICAL CARE Performed by: Kennith Maes   Total critical care time: 30 minutes  Critical care time was exclusive of separately billable procedures and treating other patients.  Critical care was necessary to treat or prevent imminent or life-threatening deterioration.  Critical care was time spent personally by me on the following activities: development of treatment plan with patient and/or surrogate as well as nursing, discussions with consultants, evaluation of patient's response to treatment, examination of patient, obtaining history from patient or surrogate, ordering and performing treatments and interventions, ordering and review of laboratory studies, ordering and review of radiographic studies, pulse oximetry and re-evaluation of patient's condition.        [name] 12/24/17 0004    Carmin Muskrat, MD 12/24/17 2356

## 2017-12-23 NOTE — ED Notes (Signed)
Note from [facility]. I STAT not crossing over.  NA 131 MMol/L K 5.2MMol/L CL 102MMol/L I Ca 1.38mmol/L TCO2 85mmol/L  Glu 700mg  BUN 25mg  Crea 1.3mg  Hct 36 %PCV Hb 12.2g/dl AnGap 86mmol

## 2017-12-23 NOTE — ED Notes (Signed)
RN notified of abnormal lab 

## 2017-12-23 NOTE — ED Notes (Signed)
ED TO INPATIENT HANDOFF REPORT  Name/Age/Gender [name] 52 y.o. female  Code Status Code Status History    Date Active Date Inactive Code Status Order ID Comments User Context   12/10/2017 1835 12/15/2017 1708 Full Code [id number]  Georgette Shell, MD ED   12/10/2017 1819 12/10/2017 1835 Full Code [id number]  Georgette Shell, MD ED   11/22/2017 1934 11/25/2017 2244 Full Code [id number]  Rise Patience, MD ED   11/05/2017 1835 11/11/2017 2134 Full Code [id number]  Hosie Poisson, MD Inpatient   07/10/2017 0751 07/12/2017 2045 Full Code [id number]  Roxan Hockey, MD Inpatient   06/24/2017 0139 06/25/2017 1749 Full Code [id number]  Etta Quill, DO ED   06/16/2017 0324 06/17/2017 1738 Full Code [id number]  Elwyn Reach, MD Inpatient   06/11/2017 0324 06/12/2017 2030 Full Code [id number]  Bonnielee Haff, MD ED   05/16/2017 1756 05/18/2017 1832 Full Code [id number]  Geradine Girt, DO Inpatient   05/06/2017 1130 05/09/2017 2049 Full Code [id number]  Donne Hazel, MD ED   03/25/2017 0742 04/04/2017 0144 Full Code [id number]  Scatliffe, Rise Paganini, MD Inpatient   03/25/2017 0653 03/25/2017 0742 Full Code [id number]  Kandice Hams, MD Inpatient   03/16/2017 0606 03/20/2017 2040 Full Code [id number]  Jani Gravel, MD Inpatient   03/05/2017 1836 03/09/2017 1626 Full Code [id number]  Bonnielee Haff, MD Inpatient   02/05/2017 1118 02/06/2017 1706 Full Code [id number]  Merton Border, MD Inpatient   01/31/2017 2122 02/03/2017 1847 Full Code [id number]  Vashti Hey, MD Inpatient   09/27/2016 1833 09/29/2016 2030 Full Code [id number]  Doreatha Lew, MD Inpatient   04/20/2016 1809 04/23/2016 1937 Full Code [id number]  Hosie Poisson, MD Inpatient   04/06/2016 1350 04/09/2016 1829 Full Code [id number]  Elgergawy, Silver Huguenin, MD ED   03/23/2016 0122 03/26/2016 2101 Full Code [id number]  Vianne Bulls, MD ED   03/18/2016 1559 03/20/2016 1741 Full Code [id number]  Mariel Aloe, MD Inpatient   02/19/2016 1416 02/21/2016 1732 Full Code [id number]  Caren Griffins, MD ED   01/24/2016 0701 01/26/2016 2001 Full Code [id number]  Karmen Bongo, MD Inpatient   01/19/2016 1423 01/21/2016 1738 Full Code [id number]  Thurnell Lose, MD ED   11/27/2015 2201 12/01/2015 1839 Full Code [id number]  Toy Baker, MD Inpatient   11/14/2015 0405 11/16/2015 1931 Full Code [id number]  Ivor Costa, MD ED   09/02/2015 1233 09/05/2015 2212 Full Code [id number]  Kelvin Cellar, MD ED   07/29/2015 0755 08/01/2015 2303 Full Code [id number]  Reyne Dumas, MD ED   06/29/2015 2027 07/03/2015 1758 Full Code [id number]  Robbie Lis, MD Inpatient   06/22/2015 1058 06/25/2015 1847 Full Code [id number]  Radene Gunning, NP Inpatient   06/14/2015 1829 06/20/2015 2140 Full Code [id number]  Domenic Polite, MD Inpatient   05/26/2015 2202 06/05/2015 1929 Full Code [id number]  Reubin Milan, MD Inpatient   05/23/2015 0717 05/24/2015 1819 Full Code [id number]  Norval Morton, MD Inpatient   05/17/2015 1221 05/20/2015 1813 Full Code [id number]  Eugenie Filler, MD Inpatient   05/17/2015 1109 05/17/2015 1221 Full Code [id number]  Eugenie Filler, MD ED   04/19/2015 0238 04/20/2015 2229 Full Code [id number]  Rise Patience, MD Inpatient   04/05/2015 0543 04/09/2015 1925 Full Code [id number]  Rise Patience, MD Inpatient   04/05/2015 0537 04/05/2015 0543 Full Code [id number]  Despina Hick, RN Inpatient   03/09/2015 0742 03/12/2015 1911 Full Code [id number]  Doyle Askew,  Angeline Slim, MD Inpatient   02/20/2015 0108 02/22/2015 1908 Full Code [id number]  Toy Baker, MD Inpatient   02/08/2015 2343 02/13/2015 1532 Full Code [id number]  Lavina Hamman, MD ED   10/21/2014 0057 10/23/2014 1514 Full Code [id number]  Rise Patience, MD Inpatient   02/12/2013 2316 02/17/2013 2122 Full Code [id number]  Quintella Baton, MD Inpatient   11/25/2012 1348 11/28/2012 1904 Full Code [id number]  Elmarie Shiley, MD ED   11/05/2012 1737 11/07/2012 1620 Full  Code [id number]  Sheila Oats, MD Inpatient   04/03/2012 0227 04/05/2012 1643 Full Code [id number]  Quintella Baton, MD Inpatient   03/24/2012 0021 03/28/2012 1751 Full Code [id number]  Leeroy Cha, RN ED      Home/SNF/Other Home  Chief Complaint nausea/emesis/hyperglycemia  Level of Care/Admitting Diagnosis ED Disposition    None      Medical History Past Medical History:  Diagnosis Date  . Depression   . Diabetes mellitus without complication ([place])   . Essential hypertension   . Gastroparesis   . GERD (gastroesophageal reflux disease)   . HLD (hyperlipidemia)     Allergies Allergies  Allergen Reactions  . Anesthetics, Amide Nausea And Vomiting  . Penicillins Diarrhea, Nausea And Vomiting and Other (See Comments)    Has patient had a PCN reaction causing immediate rash, facial/tongue/throat swelling, SOB or lightheadedness with hypotension: Yes Has patient had a PCN reaction causing severe rash involving mucus membranes or skin necrosis: No Has patient had a PCN reaction that required hospitalization No Has patient had a PCN reaction occurring within the last 10 years: Yes  If all of the above answers are "NO", then may proceed with Cephalosporin use.   . Buprenorphine Hcl Rash  . Encainide Nausea And Vomiting  . Metoclopramide Other (See Comments)    Dystonia, muscle rigidity.     IV Location/Drains/Wounds Patient Lines/Drains/Airways Status   Active Line/Drains/Airways    Name:   Placement date:   Placement time:   Site:   Days:   PICC Double Lumen 59/93/57 PICC Left Basilic 41 cm   01/77/93    1645     less than 1          Labs/Imaging Results for orders placed or performed during the hospital encounter of 12/23/17 (from the past 48 hour(s))  CBG monitoring, ED     Status: Abnormal   Collection Time: 12/23/17 10:01 AM  Result Value Ref Range   Glucose-Capillary >600 (HH) 70 - 99 mg/dL  Comprehensive metabolic panel     Status: Abnormal    Collection Time: 12/23/17 10:07 AM  Result Value Ref Range   Sodium 133 (L) 135 - 145 mmol/L    Comment: REPEATED TO VERIFY   Potassium 5.3 (H) 3.5 - 5.1 mmol/L    Comment: NO VISIBLE HEMOLYSIS   Chloride 96 (L) 98 - 111 mmol/L    Comment: Please note change in reference range. REPEATED TO VERIFY    CO2 <7 (L) 22 - 32 mmol/L    Comment: REPEATED TO VERIFY   Glucose, Bld 797 (HH) 70 - 99 mg/dL    Comment: Please note change in reference range. CRITICAL RESULT CALLED TO, READ BACK BY AND VERIFIED WITH: WEST,S. RN AT 1142 12/23/17 MULLINS,T    BUN 31 (H) 6 - 20 mg/dL    Comment: Please note change in reference range.   Creatinine, Ser 1.91 (H) 0.44 - 1.00 mg/dL   Calcium 9.0 8.9 - 10.3 mg/dL   Total  Protein 7.5 6.5 - 8.1 g/dL   Albumin 3.9 3.5 - 5.0 g/dL   AST 20 15 - 41 U/L   ALT 17 0 - 44 U/L    Comment: Please note change in reference range.   Alkaline Phosphatase 89 38 - 126 U/L   Total Bilirubin 2.5 (H) 0.3 - 1.2 mg/dL   GFR calc non Af Amer 30 (L) >60 mL/min   GFR calc Af Amer 34 (L) >60 mL/min    Comment: (NOTE) The eGFR has been calculated using the CKD EPI equation. This calculation has not been validated in all clinical situations. eGFR's persistently <60 mL/min signify possible Chronic Kidney Disease.    Anion gap NOT CALCULATED 5 - 15    Comment: REPEATED TO VERIFY Performed at [facility] [address], [place], [place] [address]   Lipase, blood     Status: None   Collection Time: 12/23/17 10:07 AM  Result Value Ref Range   Lipase 24 11 - 51 U/L    Comment: Performed at [facility], [place] [address] [place], [place] [address]  CBC with Differential     Status: Abnormal   Collection Time: 12/23/17 10:07 AM  Result Value Ref Range   WBC 19.3 (H) 4.0 - 10.5 K/uL   RBC 4.01 3.87 - 5.11 MIL/uL   Hemoglobin 11.1 (L) 12.0 - 15.0 g/dL   HCT 36.5 36.0 - 46.0 %   MCV 91.0 78.0 - 100.0 fL   MCH 27.7 26.0 - 34.0 pg   MCHC  30.4 30.0 - 36.0 g/dL   RDW 16.7 (H) 11.5 - 15.5 %   Platelets 538 (H) 150 - 400 K/uL   Neutrophils Relative % 90 %   Lymphocytes Relative 7 %   Monocytes Relative 3 %   Eosinophils Relative 0 %   Basophils Relative 0 %   Neutro Abs 17.3 (H) 1.7 - 7.7 K/uL   Lymphs Abs 1.4 0.7 - 4.0 K/uL   Monocytes Absolute 0.6 0.1 - 1.0 K/uL   Eosinophils Absolute 0.0 0.0 - 0.7 K/uL   Basophils Absolute 0.0 0.0 - 0.1 K/uL   RBC Morphology BURR CELLS     Comment: Performed at [facility], [place] [address] [place], [place] [address]  I-Stat beta hCG blood, ED     Status: None   Collection Time: 12/23/17 10:46 AM  Result Value Ref Range   I-stat hCG, quantitative <5.0 <5 mIU/mL   Comment 3            Comment:   GEST. AGE      CONC.  (mIU/mL)   <=1 WEEK        5 - 50     2 WEEKS       50 - 500     3 WEEKS       100 - 10,000     4 WEEKS     1,000 - 30,000        FEMALE AND NON-PREGNANT FEMALE:     LESS THAN 5 mIU/mL   I-stat Chem 8, ED     Status: Abnormal   Collection Time: 12/23/17 10:48 AM  Result Value Ref Range   Sodium 131 (L) 135 - 145 mmol/L   Potassium 5.2 (H) 3.5 - 5.1 mmol/L   Chloride 102 98 - 111 mmol/L   BUN 25 (H) 6 - 20 mg/dL   Creatinine, Ser 1.30 (H) 0.44 - 1.00 mg/dL   Glucose, Bld >  700 (HH) 70 - 99 mg/dL   Calcium, Ion 1.15 1.15 - 1.40 mmol/L   TCO2 7 (L) 22 - 32 mmol/L   Hemoglobin 12.2 12.0 - 15.0 g/dL   HCT 36.0 36.0 - 46.0 %   Comment NOTIFIED PHYSICIAN   Blood gas, venous     Status: Abnormal   Collection Time: 12/23/17 11:17 AM  Result Value Ref Range   pH, Ven 7.077 (LL) 7.250 - 7.430    Comment: CRITICAL RESULT CALLED TO, READ BACK BY AND VERIFIED WITH: RBV MACZIS, PA AT 1128 BY DEE WALTERS RRT ON 12/23/17    pO2, Ven 103.0 (H) 32.0 - 45.0 mmHg   O2 Saturation 94.3 %   Patient temperature 98.6    Collection site DRAWN BY RN    Drawn by DRAWN BY RN    Sample type VEIN     Comment: Performed at [facility], [place]  [address] [place], [place] [address]  Urinalysis, Routine w reflex microscopic     Status: Abnormal   Collection Time: 12/23/17  2:30 PM  Result Value Ref Range   Color, Urine YELLOW YELLOW   APPearance CLEAR CLEAR   Specific Gravity, Urine 1.018 1.005 - 1.030   pH 5.0 5.0 - 8.0   Glucose, UA >=500 (A) NEGATIVE mg/dL   Hgb urine dipstick SMALL (A) NEGATIVE   Bilirubin Urine NEGATIVE NEGATIVE   Ketones, ur 80 (A) NEGATIVE mg/dL   Protein, ur 30 (A) NEGATIVE mg/dL   Nitrite NEGATIVE NEGATIVE   Leukocytes, UA NEGATIVE NEGATIVE   RBC / HPF 0-5 0 - 5 RBC/hpf   WBC, UA 0-5 0 - 5 WBC/hpf   Bacteria, UA NONE SEEN NONE SEEN    Comment: Performed at [facility], [place] [address], [place], [place] [address]   Irpicc Placement Left >5 Yrs Inc Img Guide  Result Date: 12/23/2017 INDICATION: 52 year old female with a history of DKA EXAM: PICC LINE PLACEMENT WITH ULTRASOUND AND FLUOROSCOPIC GUIDANCE MEDICATIONS: None ANESTHESIA/SEDATION: None FLUOROSCOPY TIME:  Fluoroscopy Time: 0 minutes 42 seconds (1.9 mGy). COMPLICATIONS: None PROCEDURE: Informed written consent was obtained from the patient after a thorough discussion of the procedural risks, benefits and alternatives. All questions were addressed. Maximal Sterile Barrier Technique was utilized including caps, mask, sterile gowns, sterile gloves, sterile drape, hand hygiene and skin antiseptic. A timeout was performed prior to the initiation of the procedure. Patient was position in the supine position on the fluoroscopy table with the right arm abducted 90 degrees. Ultrasound survey of the upper extremity was performed with images stored and sent to PACs. The left brachial vein was selected for access. Once the patient was prepped and draped in the usual sterile fashion, the skin and subcutaneous tissues were generously infiltrated with 1% lidocaine for local anesthesia. A micropuncture access kit was then used to access the targeted  vein. Wire was passed centrally, confirmed to be within the venous system under fluoroscopy. A small stab incision was made with an 11 blade scalpel and the sheath was then placed over the wire. Estimated length of the catheter was then performed with the indwelling wire. Catheter was amputated at 41 cm length and placed with coaxial wire through the peel-away. Double-lumen, power injectable PICC in the left brachial vein. Tip confirmed at the cavoatrial junction, and the catheter is ready for use. Stat lock was placed. Patient tolerated the procedure well and remained hemodynamically stable throughout. No complications were encountered and no significant blood loss was encountered.  IMPRESSION: Status post left upper extremity PICC. Signed, Dulcy Fanny. Dellia Nims, RPVI [facility] Radiology PLAN: Anticipate future PICC placement to be difficult, with limited venous access and small caliber veins. Electronically Signed   By: Corrie Mckusick D.O.   On: 12/23/2017 17:33    Pending Labs Unresulted Labs (From admission, onward)   Start     Ordered   12/23/17 1008  Urine culture  STAT,   STAT     12/23/17 1008      Vitals/Pain Today's Vitals   12/23/17 1430 12/23/17 1500 12/23/17 1531 12/23/17 1730  BP: (!) 111/49 (!) 110/50 (!) 117/49 116/88  Pulse: (!) 101 (!) 103 (!) 102 (!) 102  Resp: (!) 35 (!) 28 (!) 26 (!) 26  Temp:      TempSrc:      SpO2: 100% 100% 100% 100%  Weight:      Height:      PainSc:        Isolation Precautions No active isolations  Medications Medications  insulin regular (NOVOLIN R,HUMULIN R) 100 Units in sodium chloride 0.9 % 100 mL (1 Units/mL) infusion (5.4 Units/hr Intravenous New Bag/Given 12/23/17 1732)  sodium chloride 0.9 % bolus 1,000 mL (0 mLs Intravenous Stopped 12/23/17 1217)    And  0.9 %  sodium chloride infusion ( Intravenous New Bag/Given 12/23/17 1732)  dextrose 5 %-0.45 % sodium chloride infusion ( Intravenous  Hold 12/23/17 1126)  lidocaine (PF) (XYLOCAINE) 1 % injection (10 mLs Infiltration Given 12/23/17 1640)  sodium chloride 0.9 % bolus 500 mL (0 mLs Intravenous Stopped 12/23/17 1217)  LORazepam (ATIVAN) injection 1 mg (1 mg Intravenous Given 12/23/17 1138)  lidocaine (XYLOCAINE) 1 % (with pres) injection (  Given by Other 12/23/17 1731)    Mobility walks with person assist

## 2017-12-23 NOTE — ED Notes (Signed)
Pt demanding water. Pt provided half cup of ice chips, as she will not cooperate otherwise with any other orders

## 2017-12-23 NOTE — ED Notes (Signed)
Pt provided update, hooked up to fluids. Pt reminded to stay in bed to prevent falling.

## 2017-12-23 NOTE — H&P (Signed)
History and Physical    [name] UQJ:[id number] DOB: [birth year] DOA: 12/23/2017  PCP: [name], MD Patient coming from: home   Chief Complaint: Abdominal discomfort  HPI: [name] is a 52 y.o. female with medical history significant of depression, anxiety, peripheral neuropathy, uncontrolled diabetes mellitus-insulin-dependent, medication noncompliance came to the hospital for evaluation of abdominal discomfort and fatigue.  Patient states for the past 2-3 days she thinks her gastroparesis has been acting up and she is not been able to keep any food down due to this she has had mild nonspecific abdominal discomfort without any fevers and chills.  She has not taken any of her diabetic medications since then but over the course of last 24 hours she continued to feel miserable therefore came to the ER for evaluation. She was here about a week ago for diabetic ketoacidosis and eventually discharged on subcutaneous insulin.  Today patient presented to the ER early in the morning and was diagnosed with diabetic ketoacidosis-moderate, leukocytosis and acute kidney injury.  Due to lack of IV access, interventional radiology was consulted to place PICC line.  Treatment was delayed secondary to poor/IV access issues.  Eventually PICC line was placed and she was started on IV fluids and insulin.  Medicine team was called to admit the patient.  I requested the ER staff to get stat ABG/VBG and repeat labs, which showed pH 6.94 with undetectable PCO2 level.  She was started on bicarb drip.   Review of Systems: As per HPI otherwise 10 point review of systems negative.  Review of Systems Otherwise negative except as per HPI, including: General: Denies fever,  night sweats or unintended weight loss. Resp: Denies cough, wheezing, shortness of breath. Cardiac: Denies chest pain, palpitations, orthopnea, paroxysmal nocturnal dyspnea. GI: Denies  diarrhea or constipation GU: Denies dysuria,  frequency, hesitancy or incontinence MS: Denies muscle aches, joint pain or swelling Neuro: Denies headache, neurologic deficits (focal weakness, numbness, tingling), abnormal gait Psych: Denies anxiety, depression, SI/HI/AVH Skin: Denies new rashes or lesions ID: Denies sick contacts, exotic exposures, travel  Past Medical History:  Diagnosis Date  . Depression   . Diabetes mellitus without complication ([place])   . Essential hypertension   . Gastroparesis   . GERD (gastroesophageal reflux disease)   . HLD (hyperlipidemia)     Past Surgical History:  Procedure Laterality Date  . COLONOSCOPY  09/27/2014   at [facility]  . ESOPHAGOGASTRODUODENOSCOPY  09/27/2014   at [facility], Dr Rolan Lipa. biospy neg for celiac, neg for H pylori.   . EYE SURGERY    . gailstones    . IR FLUORO GUIDE CV LINE RIGHT  02/01/2017  . IR FLUORO GUIDE CV LINE RIGHT  03/06/2017  . IR FLUORO GUIDE CV LINE RIGHT  03/25/2017  . IR GENERIC HISTORICAL  01/24/2016   IR FLUORO GUIDE CV LINE RIGHT 01/24/2016 Darrell K Allred, PA-C [facility]  . IR GENERIC HISTORICAL  01/24/2016   IR US GUIDE VASC ACCESS RIGHT 01/24/2016 Darrell K Allred, PA-C [facility]  . IR US GUIDE VASC ACCESS RIGHT  02/01/2017  . IR US GUIDE VASC ACCESS RIGHT  03/06/2017  . IR US GUIDE VASC ACCESS RIGHT  03/25/2017  . POSTERIOR VITRECTOMY AND MEMBRANE PEEL-LEFT EYE  09/28/2002  . POSTERIOR VITRECTOMY AND MEMBRANE PEEL-RIGHT EYE  03/16/2002  . RETINAL DETACHMENT SURGERY      SOCIAL HISTORY:  reports that she has never smoked. She has never used smokeless tobacco. She reports that she does not drink  alcohol or use drugs.  Allergies  Allergen Reactions  . Anesthetics, Amide Nausea And Vomiting  . Penicillins Diarrhea, Nausea And Vomiting and Other (See Comments)    Has patient had a PCN reaction causing immediate rash, facial/tongue/throat swelling, SOB or lightheadedness with hypotension: Yes Has patient had a PCN reaction causing severe rash involving  mucus membranes or skin necrosis: No Has patient had a PCN reaction that required hospitalization No Has patient had a PCN reaction occurring within the last 10 years: Yes  If all of the above answers are "NO", then may proceed with Cephalosporin use.   . Buprenorphine Hcl Rash  . Encainide Nausea And Vomiting  . Metoclopramide Other (See Comments)    Dystonia, muscle rigidity.     FAMILY HISTORY: Family History  Problem Relation Age of Onset  . Cystic fibrosis Mother   . Hypertension Father   . Diabetes Brother   . Hypertension Maternal Grandmother      Prior to Admission medications   Medication Sig Start Date End Date Taking? Authorizing Provider  bisacodyl (DULCOLAX) 5 MG EC tablet Take 5 mg by mouth daily as needed for moderate constipation.   Yes [provider]  busPIRone (BUSPAR) 10 MG tablet Take 1 tablet (10 mg total) by mouth 3 (three) times daily. 11/28/17  Yes Newlin, [name], MD  fluticasone (FLONASE) 50 MCG/ACT nasal spray Place 2 sprays into both nostrils daily. 11/12/17  Yes Eugenie Filler, MD  furosemide (LASIX) 20 MG tablet Take 1 tablet (20 mg total) by mouth daily as needed for fluid or edema. 11/28/17  Yes Newlin, [name], MD  insulin aspart (NOVOLOG) 100 UNIT/ML injection Inject 0-12 Units into the skin 4 (four) times daily -  before meals and at bedtime. Uses a sliding scale: 70-119 = 0 units, 120-150 = 2 units, 151-180 =  4 units, 181-210 = 5 units, 211-240 = 6 units, 241-270 = 8 units, 271-300 = 9 units, 301-330 = 10 units, 331-360 = 11 units, 361-400 = 12 units, greater than 400 notify MD.   Yes [provider]  insulin detemir (LEVEMIR) 100 UNIT/ML injection Inject 0.15 mLs (15 Units total) into the skin every morning. 11/28/17  Yes [name], MD  mirtazapine (REMERON) 15 MG tablet Take 1 tablet (15 mg total) by mouth at bedtime. 11/28/17  Yes [name], MD  ondansetron (ZOFRAN ODT) 4 MG disintegrating tablet Take 1 tablet (4 mg  total) by mouth every 8 (eight) hours as needed for nausea or vomiting. 12/20/17  Yes Nanavati, Ankit, MD  pantoprazole (PROTONIX) 40 MG tablet Take 1 tablet (40 mg total) by mouth daily. 11/28/17  Yes [name], MD  polyethylene glycol (MIRALAX / GLYCOLAX) packet Take 17 g by mouth daily as needed for mild constipation.  04/11/17  Yes [provider]  pregabalin (LYRICA) 150 MG capsule Take 1 capsule (150 mg total) by mouth daily. 10/16/17  Yes [name], MD  pyridostigmine (MESTINON) 60 MG tablet Take 0.5 tablets (30 mg total) by mouth 3 (three) times daily. Take 1/2 with meals 11/11/17  Yes Eugenie Filler, MD  Syringe/Needle, Disp, (SYRINGE 3CC/22GX1-1/2") 22G X 1-1/2" 3 ML MISC Inject 1,000 mcg into the muscle daily. 11/11/17  Yes Eugenie Filler, MD  clonazePAM (KLONOPIN) 0.5 MG disintegrating tablet Take 1 tablet (0.5 mg total) by mouth 2 (two) times daily. Patient not taking: Reported on 12/23/2017 04/15/17   Gerlene Fee, NP  loratadine (CLARITIN) 10 MG tablet Take 1 tablet (10 mg  total) by mouth daily. Patient not taking: Reported on 12/23/2017 11/12/17   Eugenie Filler, MD  ondansetron (ZOFRAN-ODT) 8 MG disintegrating tablet Take 1 tablet (8 mg total) by mouth every 8 (eight) hours as needed for nausea or vomiting. Patient not taking: Reported on 12/23/2017 12/12/17   Lavina Hamman, MD  promethazine (PHENERGAN) 25 MG suppository Place 1 suppository (25 mg total) rectally every 6 (six) hours as needed for nausea. Patient not taking: Reported on 12/23/2017 12/20/17   Varney Biles, MD  vitamin B-12 1000 MCG tablet Take 1 tablet (1,000 mcg total) by mouth daily. Patient not taking: Reported on 12/23/2017 11/28/17   [name], MD    Physical Exam: Vitals:   12/23/17 1531 12/23/17 1730 12/23/17 1738 12/23/17 1800  BP: (!) 117/49 116/88  118/67  Pulse: (!) 102 (!) 102 100 98  Resp: (!) 26 (!) 48 (!) 28 (!) 45  Temp:      TempSrc:      SpO2: 100% 100% 100% 100%    Weight:      Height:          Constitutional: NAD, calm, uncomfortable due to abdominal discomfort Eyes: PERRL, lids and conjunctivae normal ENMT: Mucous membranes are dry. Posterior pharynx clear of any exudate or lesions.Normal dentition.  Neck: normal, supple, no masses, no thyromegaly Respiratory: Slightly tachypneic, clear to auscultation bilaterally, no wheezing, no crackles. Normal respiratory effort. No accessory muscle use.  Cardiovascular: Regular rate and rhythm, no murmurs / rubs / gallops. No extremity edema. 2+ pedal pulses. No carotid bruits.  Abdomen: no tenderness, no masses palpated. No hepatosplenomegaly. Bowel sounds positive.  Musculoskeletal: no clubbing / cyanosis. No joint deformity upper and lower extremities. Good ROM, no contractures. Normal muscle tone.  Skin: no rashes, lesions, ulcers. No induration Neurologic: CN 2-12 grossly intact. Sensation intact, DTR normal. Strength 5/5 in all 4.  Psychiatric: Normal judgment and insight. Alert and oriented x 3. Normal mood.     Labs on Admission: I have personally reviewed following labs and imaging studies  CBC: Recent Labs  Lab 12/20/17 0301 12/23/17 1007 12/23/17 1048  WBC  --  19.3*  --   NEUTROABS  --  17.3*  --   HGB 12.2 11.1* 12.2  HCT 36.0 36.5 36.0  MCV  --  91.0  --   PLT  --  538*  --    Basic Metabolic Panel: Recent Labs  Lab 12/20/17 0301 12/23/17 1007 12/23/17 1048  NA 139 133* 131*  K 3.9 5.3* 5.2*  CL 102 96* 102  CO2  --  <7*  --   GLUCOSE 297* 797* >700*  BUN 9 31* 25*  CREATININE 0.90 1.91* 1.30*  CALCIUM  --  9.0  --    GFR: Estimated Creatinine Clearance: 45.7 mL/min (A) (by C-G formula based on SCr of 1.3 mg/dL (H)). Liver Function Tests: Recent Labs  Lab 12/23/17 1007  AST 20  ALT 17  ALKPHOS 89  BILITOT 2.5*  PROT 7.5  ALBUMIN 3.9   Recent Labs  Lab 12/23/17 1007  LIPASE 24   No results for input(s): AMMONIA in the last 168 hours. Coagulation  Profile: No results for input(s): INR, PROTIME in the last 168 hours. Cardiac Enzymes: No results for input(s): CKTOTAL, CKMB, CKMBINDEX, TROPONINI in the last 168 hours. BNP (last 3 results) No results for input(s): PROBNP in the last 8760 hours. HbA1C: No results for input(s): HGBA1C in the last 72 hours. CBG: Recent Labs  Lab  12/20/17 0226 12/23/17 1001 12/23/17 1726  GLUCAP 301* >600* >600*   Lipid Profile: No results for input(s): CHOL, HDL, LDLCALC, TRIG, CHOLHDL, LDLDIRECT in the last 72 hours. Thyroid Function Tests: No results for input(s): TSH, T4TOTAL, FREET4, T3FREE, THYROIDAB in the last 72 hours. Anemia Panel: No results for input(s): VITAMINB12, FOLATE, FERRITIN, TIBC, IRON, RETICCTPCT in the last 72 hours. Urine analysis:    Component Value Date/Time   COLORURINE YELLOW 12/23/2017 1430   APPEARANCEUR CLEAR 12/23/2017 1430   LABSPEC 1.018 12/23/2017 1430   PHURINE 5.0 12/23/2017 1430   GLUCOSEU >=500 (A) 12/23/2017 1430   HGBUR SMALL (A) 12/23/2017 1430   BILIRUBINUR NEGATIVE 12/23/2017 1430   BILIRUBINUR neg 01/17/2017 1533   KETONESUR 80 (A) 12/23/2017 1430   PROTEINUR 30 (A) 12/23/2017 1430   UROBILINOGEN 0.2 01/17/2017 1533   UROBILINOGEN 0.2 04/27/2015 1608   NITRITE NEGATIVE 12/23/2017 1430   LEUKOCYTESUR NEGATIVE 12/23/2017 1430   Sepsis Labs: !!!!!!!!!!!!!!!!!!!!!!!!!!!!!!!!!!!!!!!!!!!! _0 (procalcitonin:4,lacticidven:4) )No results found for this or any previous visit (from the past 240 hour(s)).   Radiological Exams on Admission: Irpicc Placement Left >5 Betances Guide  Result Date: 12/23/2017 INDICATION: 52 year old female with a history of DKA EXAM: PICC LINE PLACEMENT WITH ULTRASOUND AND FLUOROSCOPIC GUIDANCE MEDICATIONS: None ANESTHESIA/SEDATION: None FLUOROSCOPY TIME:  Fluoroscopy Time: 0 minutes 42 seconds (1.9 mGy). COMPLICATIONS: None PROCEDURE: Informed written consent was obtained from the patient after a thorough discussion  of the procedural risks, benefits and alternatives. All questions were addressed. Maximal Sterile Barrier Technique was utilized including caps, mask, sterile gowns, sterile gloves, sterile drape, hand hygiene and skin antiseptic. A timeout was performed prior to the initiation of the procedure. Patient was position in the supine position on the fluoroscopy table with the right arm abducted 90 degrees. Ultrasound survey of the upper extremity was performed with images stored and sent to PACs. The left brachial vein was selected for access. Once the patient was prepped and draped in the usual sterile fashion, the skin and subcutaneous tissues were generously infiltrated with 1% lidocaine for local anesthesia. A micropuncture access kit was then used to access the targeted vein. Wire was passed centrally, confirmed to be within the venous system under fluoroscopy. A small stab incision was made with an 11 blade scalpel and the sheath was then placed over the wire. Estimated length of the catheter was then performed with the indwelling wire. Catheter was amputated at 41 cm length and placed with coaxial wire through the peel-away. Double-lumen, power injectable PICC in the left brachial vein. Tip confirmed at the cavoatrial junction, and the catheter is ready for use. Stat lock was placed. Patient tolerated the procedure well and remained hemodynamically stable throughout. No complications were encountered and no significant blood loss was encountered. IMPRESSION: Status post left upper extremity PICC. Signed, Dulcy Fanny. Dellia Nims, RPVI [facility] Radiology PLAN: Anticipate future PICC placement to be difficult, with limited venous access and small caliber veins. Electronically Signed   By: Corrie Mckusick D.O.   On: 12/23/2017 17:33     All images have been reviewed by me personally.    Assessment/Plan Active Problems:   Nausea & vomiting   Diabetic neuropathy,  type I diabetes mellitus (HCC)   Anxiety and depression   Diabetic gastroparesis (HCC)   AKI (acute kidney injury) ([place])   HLD (hyperlipidemia)   GERD (gastroesophageal reflux disease)   Essential hypertension   Dehydration   DKA, type 1, not at goal [facility])  Moderate to severe diabetic ketoacidosis with history of insulin-dependent diabetes mellitus Acute metabolic alkalosis Medication noncompliance -Admit the patient for treatment of DKA - We will place the patient on DKA protocol-including insulin drip, fluids and electrolyte protocol -IV fluid for maintenance, will change to D5 once blood sugar falls below 250 - Closely monitor potassium and magnesium -ABG/VBG shows= pH 7.07 from 11am (none repeated since); pH repeated later when I saw the patient was 6.94 therefore  we will start the bicarb drip. -Periodically check BMP and magnesium (stat ordered right now) -Once the anion gap closes and patient is able to tolerate oral, start patient on diabetic diet and transition to long-acting subcutaneous insulin.  Turn off the drip after 1-2 hours of getting subcu insulin. -Closely monitor urine output -Antiemetics as necessary -Check hemoglobin A1c -Education for diabetes has been provided, diabetic coordinator consulted - Will need to work outpatient closely with patient's PCP and/or endocrinology to aggressively control diabetes. -We will keep her n.p.o. for now.  Holding home medications - Advised ER provider to consult intensivist.  Acute kidney injury, prerenal Moderate dehydration -Dehydration is secondary to hyperglycemia.  Will start patient on IV fluids.  Avoid nephrotoxic drugs and monitor renal function closely.  Abdominal discomfort -This is likely secondary to her being in DKA.  Will check abdominal x-ray.    Tachypnea - Trying to compensate for her metabolic Acidosis but will check chest x-ray  Leukocytosis -No obvious signs of infection.  Reactive in nature.  Hold  off on antibiotic and closely monitor.  Diabetic gastroparesis Peripheral neuropathy secondary to uncontrolled diabetes -Antiemetics as needed, resume Lyrica when appropriate.  History of depression and anxiety -Currently home medications are on hold while she is n.p.o.  Will resume when appropriate.  DVT prophylaxis: Hep SQ Code Status: Full  Family Communication: None at Bedside  Disposition Plan:  TBD Consults called: Advised ER to call Critical Care Admission status:  ICU   Critical Care Time Spent: 65 minutes.  >50% of the time was devoted to discussing the patients care, assessment, plan and disposition with other care givers along with counseling the patient about the risks and benefits of treatment.   Please Note: This patient record was dictated using Editor, commissioning. Chart creation errors have been sought, but may not always have been located. Such creation errors do not reflect on the Standard of Medical Care.   Ankit Arsenio Loader MD [facility] Pager [phone]  If 7PM-7AM, please contact night-coverage [email or web address] Password [email or web address]  12/23/2017, 6:11 PM

## 2017-12-23 NOTE — ED Notes (Signed)
Bed: NH91 Expected date:  Expected time:  Means of arrival:  Comments: EMS-DM-room 19

## 2017-12-23 NOTE — ED Notes (Signed)
Patient is requesting water. Spoke with Legrand [place], [place] and he prefers patient to not have any water at this time. She is waiting for IR to place a PICC line.

## 2017-12-23 NOTE — Progress Notes (Signed)
VBG back from Tabitha, RT:  PH 7.051 PCO2 19.5 PO2 46.9 cHCO3- 5.2  Values will be reported to MD.

## 2017-12-24 ENCOUNTER — Ambulatory Visit: Payer: Self-pay | Admitting: Endocrinology

## 2017-12-24 DIAGNOSIS — Z0289 Encounter for other administrative examinations: Secondary | ICD-10-CM

## 2017-12-24 DIAGNOSIS — G43A0 Cyclical vomiting, not intractable: Secondary | ICD-10-CM

## 2017-12-24 LAB — GLUCOSE, CAPILLARY
GLUCOSE-CAPILLARY: 192 mg/dL — AB (ref 70–99)
GLUCOSE-CAPILLARY: 114 mg/dL — AB (ref 70–99)
GLUCOSE-CAPILLARY: 107 mg/dL — AB (ref 70–99)
GLUCOSE-CAPILLARY: 95 mg/dL (ref 70–99)
GLUCOSE-CAPILLARY: 94 mg/dL (ref 70–99)
GLUCOSE-CAPILLARY: 91 mg/dL (ref 70–99)
GLUCOSE-CAPILLARY: 129 mg/dL — AB (ref 70–99)
Glucose-Capillary: 135 mg/dL — ABNORMAL HIGH (ref 70–99)
Glucose-Capillary: 160 mg/dL — ABNORMAL HIGH (ref 70–99)
Glucose-Capillary: 160 mg/dL — ABNORMAL HIGH (ref 70–99)
Glucose-Capillary: 202 mg/dL — ABNORMAL HIGH (ref 70–99)
Glucose-Capillary: 238 mg/dL — ABNORMAL HIGH (ref 70–99)
Glucose-Capillary: 324 mg/dL — ABNORMAL HIGH (ref 70–99)

## 2017-12-24 LAB — BASIC METABOLIC PANEL
ANION GAP: 20 — AB (ref 5–15)
ANION GAP: 14 (ref 5–15)
ANION GAP: 10 (ref 5–15)
ANION GAP: 10 (ref 5–15)
Anion gap: 6 (ref 5–15)
BUN: 36 mg/dL — ABNORMAL HIGH (ref 6–20)
BUN: 33 mg/dL — ABNORMAL HIGH (ref 6–20)
BUN: 31 mg/dL — ABNORMAL HIGH (ref 6–20)
BUN: 30 mg/dL — ABNORMAL HIGH (ref 6–20)
BUN: 22 mg/dL — ABNORMAL HIGH (ref 6–20)
CHLORIDE: 104 mmol/L (ref 98–111)
CHLORIDE: 105 mmol/L (ref 98–111)
CO2: 13 mmol/L — ABNORMAL LOW (ref 22–32)
CO2: 18 mmol/L — ABNORMAL LOW (ref 22–32)
CO2: 19 mmol/L — ABNORMAL LOW (ref 22–32)
CO2: 19 mmol/L — ABNORMAL LOW (ref 22–32)
CO2: 19 mmol/L — ABNORMAL LOW (ref 22–32)
CREATININE: 1.72 mg/dL — AB (ref 0.44–1.00)
CREATININE: 1.58 mg/dL — AB (ref 0.44–1.00)
Calcium: 7.8 mg/dL — ABNORMAL LOW (ref 8.9–10.3)
Calcium: 7.8 mg/dL — ABNORMAL LOW (ref 8.9–10.3)
Calcium: 7.7 mg/dL — ABNORMAL LOW (ref 8.9–10.3)
Calcium: 7.9 mg/dL — ABNORMAL LOW (ref 8.9–10.3)
Calcium: 7 mg/dL — ABNORMAL LOW (ref 8.9–10.3)
Chloride: 107 mmol/L (ref 98–111)
Chloride: 106 mmol/L (ref 98–111)
Chloride: 104 mmol/L (ref 98–111)
Creatinine, Ser: 2.14 mg/dL — ABNORMAL HIGH (ref 0.44–1.00)
Creatinine, Ser: 1.84 mg/dL — ABNORMAL HIGH (ref 0.44–1.00)
Creatinine, Ser: 1.44 mg/dL — ABNORMAL HIGH (ref 0.44–1.00)
GFR calc Af Amer: 30 mL/min — ABNORMAL LOW (ref >60)
GFR calc Af Amer: 36 mL/min — ABNORMAL LOW (ref >60)
GFR calc Af Amer: 48 mL/min — ABNORMAL LOW (ref >60)
GFR calc non Af Amer: 42 mL/min — ABNORMAL LOW (ref >60)
GFR, EST AFRICAN AMERICAN: 39 mL/min — AB (ref >60)
GFR, EST AFRICAN AMERICAN: 43 mL/min — AB (ref >60)
GFR, EST NON AFRICAN AMERICAN: 26 mL/min — AB (ref >60)
GFR, EST NON AFRICAN AMERICAN: 31 mL/min — AB (ref >60)
GFR, EST NON AFRICAN AMERICAN: 34 mL/min — AB (ref >60)
GFR, EST NON AFRICAN AMERICAN: 37 mL/min — AB (ref >60)
GLUCOSE: 123 mg/dL — AB (ref 70–99)
Glucose, Bld: 214 mg/dL — ABNORMAL HIGH (ref 70–99)
Glucose, Bld: 132 mg/dL — ABNORMAL HIGH (ref 70–99)
Glucose, Bld: 107 mg/dL — ABNORMAL HIGH (ref 70–99)
Glucose, Bld: 600 mg/dL (ref 70–99)
POTASSIUM: 3.2 mmol/L — AB (ref 3.5–5.1)
Potassium: 3.5 mmol/L (ref 3.5–5.1)
Potassium: 4 mmol/L (ref 3.5–5.1)
Potassium: 4.2 mmol/L (ref 3.5–5.1)
Potassium: 3.4 mmol/L — ABNORMAL LOW (ref 3.5–5.1)
SODIUM: 137 mmol/L (ref 135–145)
SODIUM: 137 mmol/L (ref 135–145)
SODIUM: 136 mmol/L (ref 135–145)
Sodium: 135 mmol/L (ref 135–145)
Sodium: 129 mmol/L — ABNORMAL LOW (ref 135–145)

## 2017-12-24 LAB — BLOOD GAS, VENOUS
Acid-base deficit: 11.4 mmol/L — ABNORMAL HIGH (ref 0.0–2.0)
Acid-base deficit: 4.6 mmol/L — ABNORMAL HIGH (ref 0.0–2.0)
Bicarbonate: 13.6 mmol/L — ABNORMAL LOW (ref 20.0–28.0)
Bicarbonate: 19.2 mmol/L — ABNORMAL LOW (ref 20.0–28.0)
O2 SAT: 75.3 %
O2 Saturation: 68.5 %
PCO2 VEN: 32.2 mmHg — AB (ref 44.0–60.0)
PH VEN: 7.294 (ref 7.250–7.430)
Patient temperature: 98.6
Patient temperature: 98.6
pCO2, Ven: 28.8 mmHg — ABNORMAL LOW (ref 44.0–60.0)
pH, Ven: 7.392 (ref 7.250–7.430)
pO2, Ven: 35.6 mmHg (ref 32.0–45.0)
pO2, Ven: 39.3 mmHg (ref 32.0–45.0)

## 2017-12-24 LAB — URINE CULTURE: CULTURE: NO GROWTH

## 2017-12-24 LAB — HEMOGLOBIN A1C
HEMOGLOBIN A1C: 8.8 % — AB (ref 4.8–5.6)
MEAN PLASMA GLUCOSE: 205.86 mg/dL

## 2017-12-24 LAB — MAGNESIUM: MAGNESIUM: 1.7 mg/dL (ref 1.7–2.4)

## 2017-12-24 MED ORDER — BUSPIRONE HCL 10 MG PO TABS
10.0000 mg | ORAL_TABLET | Freq: Three times a day (TID) | ORAL | Status: DC
  Administered 2017-12-24 – 2017-12-26 (×7): 10 mg via ORAL
  Filled 2017-12-24 (×7): qty 1

## 2017-12-24 MED ORDER — INSULIN DETEMIR 100 UNIT/ML ~~LOC~~ SOLN
15.0000 [IU] | Freq: Every day | SUBCUTANEOUS | Status: DC
  Administered 2017-12-24 – 2017-12-26 (×3): 15 [IU] via SUBCUTANEOUS
  Filled 2017-12-24 (×3): qty 0.15

## 2017-12-24 MED ORDER — MIRTAZAPINE 15 MG PO TABS
15.0000 mg | ORAL_TABLET | Freq: Every day | ORAL | Status: DC
  Administered 2017-12-24 – 2017-12-25 (×2): 15 mg via ORAL
  Filled 2017-12-24 (×2): qty 1

## 2017-12-24 MED ORDER — LORATADINE 10 MG PO TABS
10.0000 mg | ORAL_TABLET | Freq: Every day | ORAL | Status: DC
  Administered 2017-12-24 – 2017-12-26 (×3): 10 mg via ORAL
  Filled 2017-12-24 (×3): qty 1

## 2017-12-24 MED ORDER — CLONAZEPAM 0.5 MG PO TBDP: 0.5000 mg | ORAL_TABLET | Freq: Two times a day (BID) | ORAL | Status: DC

## 2017-12-24 MED ORDER — ONDANSETRON HCL 4 MG/2ML IJ SOLN
4.0000 mg | Freq: Four times a day (QID) | INTRAMUSCULAR | Status: DC | PRN
  Administered 2017-12-24 – 2017-12-25 (×3): 4 mg via INTRAVENOUS
  Filled 2017-12-24 (×3): qty 2

## 2017-12-24 MED ORDER — MAGNESIUM SULFATE 2 GM/50ML IV SOLN
2.0000 g | Freq: Once | INTRAVENOUS | Status: DC
  Filled 2017-12-24: qty 50

## 2017-12-24 MED ORDER — BISACODYL 5 MG PO TBEC: 5.0000 mg | DELAYED_RELEASE_TABLET | Freq: Every day | ORAL | Status: DC | PRN

## 2017-12-24 MED ORDER — POTASSIUM CHLORIDE 10 MEQ/100ML IV SOLN
10.0000 meq | INTRAVENOUS | Status: AC
  Administered 2017-12-24 (×4): 10 meq via INTRAVENOUS
  Filled 2017-12-24 (×4): qty 100

## 2017-12-24 MED ORDER — MAGNESIUM SULFATE 2 GM/50ML IV SOLN
2.0000 g | Freq: Once | INTRAVENOUS | Status: AC
  Administered 2017-12-24: 2 g via INTRAVENOUS

## 2017-12-24 MED ORDER — POTASSIUM CHLORIDE 10 MEQ/100ML IV SOLN
10.0000 meq | INTRAVENOUS | Status: AC
  Administered 2017-12-24 (×2): 10 meq via INTRAVENOUS
  Filled 2017-12-24 (×2): qty 100

## 2017-12-24 MED ORDER — INSULIN ASPART 100 UNIT/ML ~~LOC~~ SOLN: 0.0000 [IU] | Freq: Every day | SUBCUTANEOUS | Status: DC

## 2017-12-24 MED ORDER — PANTOPRAZOLE SODIUM 40 MG PO TBEC
40.0000 mg | DELAYED_RELEASE_TABLET | Freq: Every day | ORAL | Status: DC
  Administered 2017-12-24 – 2017-12-26 (×3): 40 mg via ORAL
  Filled 2017-12-24 (×3): qty 1

## 2017-12-24 MED ORDER — INSULIN ASPART 100 UNIT/ML ~~LOC~~ SOLN
0.0000 [IU] | Freq: Three times a day (TID) | SUBCUTANEOUS | Status: DC
  Administered 2017-12-24 – 2017-12-25 (×2): 2 [IU] via SUBCUTANEOUS
  Administered 2017-12-25: 8 [IU] via SUBCUTANEOUS
  Administered 2017-12-25: 15 [IU] via SUBCUTANEOUS
  Administered 2017-12-26: 11 [IU] via SUBCUTANEOUS

## 2017-12-24 NOTE — Progress Notes (Signed)
VBG back from Lynd, RT: PH 7.294 pCO2 28.8 PO2 35.6 HCO3 13.6

## 2017-12-24 NOTE — Progress Notes (Signed)
PROGRESS NOTE    [name]  XIP:[id number] DOB: Feb 06, 1966 DOA: 12/23/2017 PCP: [name], MD   Brief Narrative:  52 year old with a history of depression, anxiety, peripheral neuropathy, uncontrolled diabetes mellitus-insulin-dependent, medical noncompliance was admitted to the hospital for moderate to severe DKA.  Initially she was started on IV fluid, bicarb drip and routine insulin protocol with insulin drip.  She did well overnight, her pH improved and her anion gap closed.  She was transitioned to subcutaneous insulin and oral diet was started.   Assessment & Plan:   Active Problems:   Nausea & vomiting   Diabetic neuropathy, type I diabetes mellitus (HCC)   Anxiety and depression   Diabetic gastroparesis (HCC)   AKI (acute kidney injury) ([place])   HLD (hyperlipidemia)   GERD (gastroesophageal reflux disease)   Essential hypertension   Dehydration   DKA, type 1, not at goal [facility])  Diabetic ketoacidosis, resolved Insulin-dependent diabetes mellitus Medical noncompliance -Currently her anion gap is closed, pH is improved and doing well.  We will transition her to home Lantus 15 units and stop the insulin drip.  Start patient on diabetic diet.  Antiemetics as needed -Diabetic educator/coordinator has been consulted -Her hemoglobin A1c is 8.8 -Continue IV fluids, provide supportive care -She would benefit from outpatient follow-up with endocrinology  Acute kidney injury Moderate dehydration - Renal function continues to improve with IV fluid. Trended down from 2.2 to 1.7 Avoid nephrotoxic drugs, continue IV fluid today  Diabetic gastroparesis Peripheral neuropathy secondary to uncontrolled diabetes mellitus - Provide supportive care, resume home Lyrica.  History of anxiety and depression.  Home medications restarted.  Urinary retention -Overnight Foley was placed due to urinary retention.  Will attempt to take this out.  DVT prophylaxis: SQ Hep Code Status:  Full  Family Communication:  None at bedside  Disposition Plan: Maintain inpatient stay for another clinical next 48 hours until her blood pressure improves, p.o. intake improves and her renal function.  Consultants:   None   Procedures:   None   Antimicrobials:   None    Subjective: Doing better, slightly nauseous.no other complaints. Asking for more ice chips   Review of Systems Otherwise negative except as per HPI, including: General: Denies fever, chills, night sweats or unintended weight loss. Resp: Denies cough, wheezing, shortness of breath. Cardiac: Denies chest pain, palpitations, orthopnea, paroxysmal nocturnal dyspnea. GI: Denies abdominal pain,  vomiting, diarrhea or constipation GU: Denies dysuria, frequency, hesitancy or incontinence MS: Denies muscle aches, joint pain or swelling Neuro: Denies headache, neurologic deficits (focal weakness, numbness, tingling), abnormal gait Psych: Denies anxiety, depression, SI/HI/AVH Skin: Denies new rashes or lesions ID: Denies sick contacts, exotic exposures, travel  Objective: Vitals:   12/24/17 0351 12/24/17 0400 12/24/17 0600 12/24/17 0800  BP:  (!) 98/43 (!) 103/51 (!) 97/44  Pulse:  (!) 101 (!) 103 (!) 105  Resp:  _0 Temp: 97.7 F (36.5 C)   98.4 F (36.9 C)  TempSrc: Axillary   Oral  SpO2:  100% 100% 100%  Weight:      Height:        Intake/Output Summary (Last 24 hours) at 12/24/2017 1109 Last data filed at 12/24/2017 0818 Gross per 24 hour  Intake 2819.8 ml  Output 1195 ml  Net 1624.8 ml   Filed Weights   12/23/17 1006  Weight: 68 kg (150 lb)    Examination:  General exam: Appears calm and comfortable ; dry mouth and mucous membrane Respiratory system: Clear  to auscultation. Respiratory effort normal. Cardiovascular system: S1 & S2 heard, RRR. No JVD, murmurs, rubs, gallops or clicks. No pedal edema. Gastrointestinal system: Abdomen is nondistended, soft and nontender. No organomegaly or  masses felt. Normal bowel sounds heard. Central nervous system: Alert and oriented. No focal neurological deficits. Extremities: Symmetric 5 x 5 power. Skin: No rashes, lesions or ulcers Psychiatry: Judgement and insight appear normal. Mood & affect appropriate.  Urinary catheter in place.    Data Reviewed:   CBC: Recent Labs  Lab 12/20/17 0301 12/23/17 1007 12/23/17 1048 12/23/17 1806  WBC  --  19.3*  --  28.6*  NEUTROABS  --  17.3*  --   --   HGB 12.2 11.1* 12.2 10.2*  HCT 36.0 36.5 36.0 34.5*  MCV  --  91.0  --  93.0  PLT  --  538*  --  956*   Basic Metabolic Panel: Recent Labs  Lab 12/23/17 1007 12/23/17 1048 12/23/17 1806 12/23/17 1958 12/24/17 0157 12/24/17 0555 12/24/17 0959  NA 133* 131*  --  132* 137 137 136  K 5.3* 5.2*  --  4.4 3.2* 3.5 4.0  CL 96* 102  --  98 104 105 107  CO2 <7*  --   --  <7* 13* 18* 19*  GLUCOSE 797* >700*  --  689* 214* 132* 107*  BUN 31* 25*  --  37* 36* 33* 31*  CREATININE 1.91* 1.30*  --  2.29* 2.14* 1.84* 1.72*  CALCIUM 9.0  --   --  7.9* 7.8* 7.8* 7.7*  MG  --   --  2.3  --   --  1.7  --    GFR: Estimated Creatinine Clearance: 34.5 mL/min (A) (by C-G formula based on SCr of 1.72 mg/dL (H)). Liver Function Tests: Recent Labs  Lab 12/23/17 1007  AST 20  ALT 17  ALKPHOS 89  BILITOT 2.5*  PROT 7.5  ALBUMIN 3.9   Recent Labs  Lab 12/23/17 1007  LIPASE 24   No results for input(s): AMMONIA in the last 168 hours. Coagulation Profile: No results for input(s): INR, PROTIME in the last 168 hours. Cardiac Enzymes: No results for input(s): CKTOTAL, CKMB, CKMBINDEX, TROPONINI in the last 168 hours. BNP (last 3 results) No results for input(s): PROBNP in the last 8760 hours. HbA1C: Recent Labs    12/24/17 0555  HGBA1C 8.8*   CBG: Recent Labs  Lab 12/24/17 0548 12/24/17 0657 12/24/17 0806 12/24/17 0923 12/24/17 1028  GLUCAP 135* 114* 107* 95 94   Lipid Profile: No results for input(s): CHOL, HDL, LDLCALC,  TRIG, CHOLHDL, LDLDIRECT in the last 72 hours. Thyroid Function Tests: No results for input(s): TSH, T4TOTAL, FREET4, T3FREE, THYROIDAB in the last 72 hours. Anemia Panel: No results for input(s): VITAMINB12, FOLATE, FERRITIN, TIBC, IRON, RETICCTPCT in the last 72 hours. Sepsis Labs: No results for input(s): PROCALCITON, LATICACIDVEN in the last 168 hours.  Recent Results (from the past 240 hour(s))  Urine culture     Status: None   Collection Time: 12/23/17  2:30 PM  Result Value Ref Range Status   Specimen Description   Final    URINE, CLEAN CATCH Performed at [facility] [address], [place], [place] [address]    Special Requests   Final    NONE Performed at [facility], [place] [address] [place], [place] [address]    Culture   Final    NO GROWTH Performed at [facility], [place]  [address] [place], [place] [address]    Report Status 12/24/2017 FINAL  Final         Radiology Studies: Dg Abd Acute W/chest  Result Date: 12/23/2017 CLINICAL DATA:  Abdominal and back pain. Shortness of breath. History of gastroparesis. EXAM: DG ABDOMEN ACUTE W/ 1V CHEST COMPARISON:  Single-view of the chest 12/10/2017. Plain films chest and abdomen 11/22/2017. FINDINGS: Single-view of the chest demonstrates clear lungs and normal heart size. No pneumothorax or pleural effusion. Two views of the abdomen show no free intraperitoneal air. There is gaseous distention of the stomach. Small and large bowel are unremarkable. No unexpected abdominal calcification or focal bony abnormality. IMPRESSION: Gaseous distention of the stomach may be related to the patient's history of diabetic gastroparesis. The examination is otherwise negative. Electronically Signed   By: [name] Rise M.D.   On: 12/23/2017 19:01   Irpicc Placement Left >5 Yrs Inc Img Guide  Result Date: 12/23/2017 INDICATION: 52 year old female with a history of DKA EXAM: PICC LINE PLACEMENT  WITH ULTRASOUND AND FLUOROSCOPIC GUIDANCE MEDICATIONS: None ANESTHESIA/SEDATION: None FLUOROSCOPY TIME:  Fluoroscopy Time: 0 minutes 42 seconds (1.9 mGy). COMPLICATIONS: None PROCEDURE: Informed written consent was obtained from the patient after a thorough discussion of the procedural risks, benefits and alternatives. All questions were addressed. Maximal Sterile Barrier Technique was utilized including caps, mask, sterile gowns, sterile gloves, sterile drape, hand hygiene and skin antiseptic. A timeout was performed prior to the initiation of the procedure. Patient was position in the supine position on the fluoroscopy table with the right arm abducted 90 degrees. Ultrasound survey of the upper extremity was performed with images stored and sent to PACs. The left brachial vein was selected for access. Once the patient was prepped and draped in the usual sterile fashion, the skin and subcutaneous tissues were generously infiltrated with 1% lidocaine for local anesthesia. A micropuncture access kit was then used to access the targeted vein. Wire was passed centrally, confirmed to be within the venous system under fluoroscopy. A small stab incision was made with an 11 blade scalpel and the sheath was then placed over the wire. Estimated length of the catheter was then performed with the indwelling wire. Catheter was amputated at 41 cm length and placed with coaxial wire through the peel-away. Double-lumen, power injectable PICC in the left brachial vein. Tip confirmed at the cavoatrial junction, and the catheter is ready for use. Stat lock was placed. Patient tolerated the procedure well and remained hemodynamically stable throughout. No complications were encountered and no significant blood loss was encountered. IMPRESSION: Status post left upper extremity PICC. Signed, Dulcy Fanny. Dellia Nims, RPVI [facility] Interventional Radiology Specialists [facility] Radiology PLAN: Anticipate future PICC placement to be  difficult, with limited venous access and small caliber veins. Electronically Signed   By: Corrie Mckusick D.O.   On: 12/23/2017 17:33        Scheduled Meds: . busPIRone  10 mg Oral TID  . Chlorhexidine Gluconate Cloth  6 each Topical Daily  . heparin  5,000 Units Subcutaneous Q8H  . insulin aspart  0-15 Units Subcutaneous TID WC  . insulin aspart  0-5 Units Subcutaneous QHS  . insulin detemir  15 Units Subcutaneous Daily  . loratadine  10 mg Oral Daily  . mirtazapine  15 mg Oral QHS  . pantoprazole  40 mg Oral Daily  . sodium chloride flush  10-40 mL Intracatheter Q12H   Continuous Infusions: . sodium chloride Stopped (12/24/17 0156)  . dextrose 5 %  and 0.45% NaCl 125 mL/hr at 12/24/17 0157  . insulin (NOVOLIN-R) infusion 1.3 Units/hr (12/24/17 0924)  . potassium chloride 10 mEq (12/24/17 1102)     LOS: 1 day    I have spent 35 minutes face to face with the patient and on the ward discussing the patients care, assessment, plan and disposition with other care givers. >50% of the time was devoted counseling the patient about the risks and benefits of treatment and coordinating care.     Ankit Arsenio Loader, MD [facility] Pager [phone]   If 7PM-7AM, please contact night-coverage [email or web address] Password [email or web address] 12/24/2017, 11:09 AM

## 2017-12-24 NOTE — Progress Notes (Signed)
Inpatient Diabetes Program Recommendations  AACE/ADA: New Consensus Statement on Inpatient Glycemic Control (2015)  Target Ranges:  Prepandial:   less than 140 mg/dL      Peak postprandial:   less than 180 mg/dL (1-2 hours)      Critically ill patients:  140 - 180 mg/dL     Admit DKA/ Abd Pain  History: T1DM, Gastroparesis  Home DM Meds: Levemir 15 units daily        Novolog 0-12 units QID  Current Orders: IV Insulin Drip      Levemir 15 units daily       This is pt's 5th admission since January.   Well known to the Inpatient Diabetes Program.  Counseled numerous times in the past about the importance of good glucose control at home and also about taking her basal insulin even when she is unable to eat.  BMET from [place] today shows CO2 improved to 18 and Anion Gap improved to 14.  Note that MD has placed orders for Levemir to start transition off the IV Insulin drip.     MD- Please also consider placing orders for Novolog Sensitive Correction Scale/ SSI (0-9 units) Q4 hours      --Will follow patient during hospitalization--  Wyn Quaker RN, MSN, CDE Diabetes Coordinator Inpatient Glycemic Control Team Team Pager: [phone] (8a-5p)

## 2017-12-25 DIAGNOSIS — E0910 Drug or chemical induced diabetes mellitus with ketoacidosis without coma: Secondary | ICD-10-CM

## 2017-12-25 DIAGNOSIS — Z8639 Personal history of other endocrine, nutritional and metabolic disease: Secondary | ICD-10-CM

## 2017-12-25 LAB — CBC
HCT: 30.8 % — ABNORMAL LOW (ref 36.0–46.0)
Hemoglobin: 10 g/dL — ABNORMAL LOW (ref 12.0–15.0)
MCH: 27.2 pg (ref 26.0–34.0)
MCHC: 32.5 g/dL (ref 30.0–36.0)
MCV: 83.9 fL (ref 78.0–100.0)
PLATELETS: 300 10*3/uL (ref 150–400)
RBC: 3.67 MIL/uL — AB (ref 3.87–5.11)
RDW: 17.5 % — ABNORMAL HIGH (ref 11.5–15.5)
WBC: 12.3 10*3/uL — AB (ref 4.0–10.5)

## 2017-12-25 LAB — BASIC METABOLIC PANEL
ANION GAP: 7 (ref 5–15)
ANION GAP: 7 (ref 5–15)
BUN: 19 mg/dL (ref 6–20)
BUN: 23 mg/dL — ABNORMAL HIGH (ref 6–20)
CALCIUM: 7.9 mg/dL — AB (ref 8.9–10.3)
CALCIUM: 7.9 mg/dL — AB (ref 8.9–10.3)
CO2: 21 mmol/L — ABNORMAL LOW (ref 22–32)
CO2: 22 mmol/L (ref 22–32)
CREATININE: 1.34 mg/dL — AB (ref 0.44–1.00)
Chloride: 105 mmol/L (ref 98–111)
Chloride: 108 mmol/L (ref 98–111)
Creatinine, Ser: 1.36 mg/dL — ABNORMAL HIGH (ref 0.44–1.00)
GFR calc Af Amer: 52 mL/min — ABNORMAL LOW (ref >60)
GFR, EST AFRICAN AMERICAN: 53 mL/min — AB (ref >60)
GFR, EST NON AFRICAN AMERICAN: 45 mL/min — AB (ref >60)
GFR, EST NON AFRICAN AMERICAN: 45 mL/min — AB (ref >60)
GLUCOSE: 192 mg/dL — AB (ref 70–99)
Glucose, Bld: 297 mg/dL — ABNORMAL HIGH (ref 70–99)
Potassium: 3.8 mmol/L (ref 3.5–5.1)
Potassium: 4 mmol/L (ref 3.5–5.1)
SODIUM: 136 mmol/L (ref 135–145)
Sodium: 134 mmol/L — ABNORMAL LOW (ref 135–145)

## 2017-12-25 LAB — GLUCOSE, CAPILLARY
GLUCOSE-CAPILLARY: 362 mg/dL — AB (ref 70–99)
GLUCOSE-CAPILLARY: 130 mg/dL — AB (ref 70–99)
Glucose-Capillary: 261 mg/dL — ABNORMAL HIGH (ref 70–99)
Glucose-Capillary: 173 mg/dL — ABNORMAL HIGH (ref 70–99)

## 2017-12-25 LAB — MAGNESIUM: MAGNESIUM: 2 mg/dL (ref 1.7–2.4)

## 2017-12-25 MED ORDER — SODIUM CHLORIDE 0.9 % IV SOLN
INTRAVENOUS | Status: DC
  Administered 2017-12-25: 10:00:00 via INTRAVENOUS

## 2017-12-25 MED ORDER — PREGABALIN 75 MG PO CAPS
150.0000 mg | ORAL_CAPSULE | Freq: Every day | ORAL | Status: DC
  Administered 2017-12-25 – 2017-12-26 (×2): 150 mg via ORAL
  Filled 2017-12-25 (×2): qty 2

## 2017-12-25 NOTE — Progress Notes (Signed)
PROGRESS NOTE    [name]  OQH:[id number] DOB: 1965/08/21 DOA: 12/23/2017 PCP: [name], MD    Brief Narrative: 52 year old with a history of depression, anxiety, peripheral neuropathy, uncontrolled diabetes mellitus-insulin-dependent, medical noncompliance was admitted to the hospital for moderate to severe DKA.  Initially she was started on IV fluid, bicarb drip and routine insulin protocol with insulin drip.  She did well overnight, her pH improved and her anion gap closed.  She was transitioned to subcutaneous insulin and oral diet was started.    Assessment & Plan:   Active Problems:   Nausea & vomiting   Diabetic neuropathy, type I diabetes mellitus (HCC)   Anxiety and depression   Diabetic gastroparesis (HCC)   AKI (acute kidney injury) ([place])   HLD (hyperlipidemia)   GERD (gastroesophageal reflux disease)   Essential hypertension   Dehydration   DKA, type 1, not at goal [facility])   DKA resolved.  Uncontrolled DM with hyperglycemia from non compliance: Gap still closed .  Potassium wnl.  Restarted her on levemir at 15 units daily , her morning cbg is elevated at 362, probably from dextrose fluids.  Will d/c dextrose fluids and start her NS at 75 ml/hr.  Resume SSI.  Watch her for another 24 hours before discharging her.     AKI;  Possibly from DKA. Improved.    GERD stable.    Dehydration:  Improved.    Hypertension:  Slightly hypertensive this am.  Restart her home meds on discharge.    diabetic gastroparesis:  Resume home meds.    H/o anxiety and depression:  Home meds restarted.    Urinary retention:  Foley placed. Voiding trial today.     DVT prophylaxis: lovenox.  Code Status: full code.  Family Communication: none at bedside.  Disposition Plan: transfer to med surg.   Consultants:   DM co ordinator.   Procedures: none/    Antimicrobials: none.    Subjective: Feeling nauseated , no vomiting. Some abdominal  discomfort.   Objective: Vitals:   12/25/17 0400 12/25/17 0500 12/25/17 0600 12/25/17 0800  BP: (!) 158/70  (!) 157/66 (!) 158/76  Pulse: (!) 103 100 (!) 103 (!) 102  Resp: _0 Temp:    98.4 F (36.9 C)  TempSrc:    Oral  SpO2: 100% 100% 100% 98%  Weight:      Height:        Intake/Output Summary (Last 24 hours) at 12/25/2017 0909 Last data filed at 12/25/2017 0600 Gross per 24 hour  Intake 2638.58 ml  Output 1700 ml  Net 938.58 ml   Filed Weights   12/23/17 1006  Weight: 68 kg (150 lb)    Examination:  General exam: Appears calm and comfortable  Respiratory system: Clear to auscultation. Respiratory effort normal. Cardiovascular system: S1 & S2 heard, RRR. No JVD, murmurs,. No pedal edema. Gastrointestinal system: Abdomen is nondistended, soft and nontender. No organomegaly or masses felt. Normal bowel sounds heard. Central nervous system: Alert and oriented. Non focal.  Extremities: Symmetric 5 x 5 power. Skin: No rashes, lesions or ulcers Psychiatry: Mood okay, flat affect.    Data Reviewed: I have personally reviewed following labs and imaging studies  CBC: Recent Labs  Lab 12/20/17 0301 12/23/17 1007 12/23/17 1048 12/23/17 1806 12/25/17 0500  WBC  --  19.3*  --  28.6* 12.3*  NEUTROABS  --  17.3*  --   --   --   HGB 12.2 11.1* 12.2 10.2* 10.0*  HCT 36.0 36.5 36.0 34.5* 30.8*  MCV  --  91.0  --  93.0 83.9  PLT  --  538*  --  553* 341   Basic Metabolic Panel: Recent Labs  Lab 12/23/17 1806  12/24/17 0555 12/24/17 0959 12/24/17 1353 12/24/17 2059 12/24/17 2356 12/25/17 0500  NA  --    < > 137 136 135 129* 136 134*  K  --    < > 3.5 4.0 4.2 3.4* 3.8 4.0  CL  --    < > 105 107 106 104 108 105  CO2  --    < > 18* 19* 19* 19* 21* 22  GLUCOSE  --    < > 132* 107* 123* 600* 192* 297*  BUN  --    < > 33* 31* 30* 22* 23* 19  CREATININE  --    < > 1.84* 1.72* 1.58* 1.44* 1.36* 1.34*  CALCIUM  --    < > 7.8* 7.7* 7.9* 7.0* 7.9* 7.9*  MG 2.3  --   1.7  --   --   --   --  2.0   < > = values in this interval not displayed.   GFR: Estimated Creatinine Clearance: 44.3 mL/min (A) (by C-G formula based on SCr of 1.34 mg/dL (H)). Liver Function Tests: Recent Labs  Lab 12/23/17 1007  AST 20  ALT 17  ALKPHOS 89  BILITOT 2.5*  PROT 7.5  ALBUMIN 3.9   Recent Labs  Lab 12/23/17 1007  LIPASE 24   No results for input(s): AMMONIA in the last 168 hours. Coagulation Profile: No results for input(s): INR, PROTIME in the last 168 hours. Cardiac Enzymes: No results for input(s): CKTOTAL, CKMB, CKMBINDEX, TROPONINI in the last 168 hours. BNP (last 3 results) No results for input(s): PROBNP in the last 8760 hours. HbA1C: Recent Labs    12/24/17 0555  HGBA1C 8.8*   CBG: Recent Labs  Lab 12/24/17 1028 12/24/17 1139 12/24/17 1650 12/24/17 2145 12/25/17 0756  GLUCAP 94 91 129* 160* 362*   Lipid Profile: No results for input(s): CHOL, HDL, LDLCALC, TRIG, CHOLHDL, LDLDIRECT in the last 72 hours. Thyroid Function Tests: No results for input(s): TSH, T4TOTAL, FREET4, T3FREE, THYROIDAB in the last 72 hours. Anemia Panel: No results for input(s): VITAMINB12, FOLATE, FERRITIN, TIBC, IRON, RETICCTPCT in the last 72 hours. Sepsis Labs: No results for input(s): PROCALCITON, LATICACIDVEN in the last 168 hours.  Recent Results (from the past 240 hour(s))  Urine culture     Status: None   Collection Time: 12/23/17  2:30 PM  Result Value Ref Range Status   Specimen Description   Final    URINE, CLEAN CATCH Performed at [facility] [address], [place], [place] [address]    Special Requests   Final    NONE Performed at [facility], [place] [address] [place], [place] [address]    Culture   Final    NO GROWTH Performed at [facility], [place] [address] [place], [place] [address]    Report Status 12/24/2017 FINAL  Final         Radiology Studies: Dg Abd Acute W/chest  Result  Date: 12/23/2017 CLINICAL DATA:  Abdominal and back pain. Shortness of breath. History of gastroparesis. EXAM: DG ABDOMEN ACUTE W/ 1V CHEST COMPARISON:  Single-view of the chest 12/10/2017. Plain films chest and abdomen 11/22/2017. FINDINGS: Single-view of the chest demonstrates clear lungs and normal heart size. No pneumothorax or pleural  effusion. Two views of the abdomen show no free intraperitoneal air. There is gaseous distention of the stomach. Small and large bowel are unremarkable. No unexpected abdominal calcification or focal bony abnormality. IMPRESSION: Gaseous distention of the stomach may be related to the patient's history of diabetic gastroparesis. The examination is otherwise negative. Electronically Signed   By: [name] Rise M.D.   On: 12/23/2017 19:01   Irpicc Placement Left >5 Yrs Inc Img Guide  Result Date: 12/23/2017 INDICATION: 52 year old female with a history of DKA EXAM: PICC LINE PLACEMENT WITH ULTRASOUND AND FLUOROSCOPIC GUIDANCE MEDICATIONS: None ANESTHESIA/SEDATION: None FLUOROSCOPY TIME:  Fluoroscopy Time: 0 minutes 42 seconds (1.9 mGy). COMPLICATIONS: None PROCEDURE: Informed written consent was obtained from the patient after a thorough discussion of the procedural risks, benefits and alternatives. All questions were addressed. Maximal Sterile Barrier Technique was utilized including caps, mask, sterile gowns, sterile gloves, sterile drape, hand hygiene and skin antiseptic. A timeout was performed prior to the initiation of the procedure. Patient was position in the supine position on the fluoroscopy table with the right arm abducted 90 degrees. Ultrasound survey of the upper extremity was performed with images stored and sent to PACs. The left brachial vein was selected for access. Once the patient was prepped and draped in the usual sterile fashion, the skin and subcutaneous tissues were generously infiltrated with 1% lidocaine for local anesthesia. A micropuncture access  kit was then used to access the targeted vein. Wire was passed centrally, confirmed to be within the venous system under fluoroscopy. A small stab incision was made with an 11 blade scalpel and the sheath was then placed over the wire. Estimated length of the catheter was then performed with the indwelling wire. Catheter was amputated at 41 cm length and placed with coaxial wire through the peel-away. Double-lumen, power injectable PICC in the left brachial vein. Tip confirmed at the cavoatrial junction, and the catheter is ready for use. Stat lock was placed. Patient tolerated the procedure well and remained hemodynamically stable throughout. No complications were encountered and no significant blood loss was encountered. IMPRESSION: Status post left upper extremity PICC. Signed, Dulcy Fanny. Dellia Nims, RPVI Vascular and Interventional Radiology Specialists [facility] Radiology PLAN: Anticipate future PICC placement to be difficult, with limited venous access and small caliber veins. Electronically Signed   By: Corrie Mckusick D.O.   On: 12/23/2017 17:33        Scheduled Meds: . busPIRone  10 mg Oral TID  . Chlorhexidine Gluconate Cloth  6 each Topical Daily  . heparin  5,000 Units Subcutaneous Q8H  . insulin aspart  0-15 Units Subcutaneous TID WC  . insulin aspart  0-5 Units Subcutaneous QHS  . insulin detemir  15 Units Subcutaneous Daily  . loratadine  10 mg Oral Daily  . mirtazapine  15 mg Oral QHS  . pantoprazole  40 mg Oral Daily  . sodium chloride flush  10-40 mL Intracatheter Q12H   Continuous Infusions: . sodium chloride       LOS: 2 days    Time spent: 30 minutes.     Hosie Poisson, MD [facility] Pager [phone]   If 7PM-7AM, please contact night-coverage [email or web address] Password [email or web address] 12/25/2017, 9:09 AM

## 2017-12-25 NOTE — Progress Notes (Signed)
Inpatient Diabetes Program Recommendations  AACE/ADA: New Consensus Statement on Inpatient Glycemic Control (2015)  Target Ranges:  Prepandial:   less than 140 mg/dL      Peak postprandial:   less than 180 mg/dL (1-2 hours)      Critically ill patients:  140 - 180 mg/dL   Results for [name], [name] (MRN [id number]) as of 12/25/2017 08:53  Ref. Range 12/24/2017 10:28 12/24/2017 11:39 12/24/2017 16:50 12/24/2017 21:45 12/25/2017 07:56  Glucose-Capillary Latest Ref Range: 70 - 99 mg/dL 94 91 129 (H) 160 (H) 362 (H)    Admit DKA/ Abd Pain  History: T1DM, Gastroparesis  Home DM Meds: Levemir 15 units daily                              Novolog 0-12 units QID  Current Orders: Levemir 15 units daily      Novolog Moderate Correction Scale/ SSI (0-15 units) TID AC + HS     This is pt's 5th admission since January.   Well known to the Inpatient Diabetes Program.  Counseled numerous times in the past about the importance of good glucose control at home and also about taking her basal insulin even when she is unable to eat.  Transitioned off the IV Insulin drip yesterday AM.    MD- CBGs well controlled after coming off the Insulin drip, however, note that AM CBG today elevated to 362 mg/dl.  Perhaps patient requires a bit more basal insulin?  Please consider increasing Levemir slightly to 18 units daily    --Will follow patient during hospitalization--  Wyn Quaker RN, MSN, CDE Diabetes Coordinator Inpatient Glycemic Control Team Team Pager: [phone] (8a-5p)

## 2017-12-25 NOTE — Progress Notes (Signed)
Patient arrived on unit to room 1510 from ICU.  Patient settled.  No complaints at this time.   Virginia Rochester, RN

## 2017-12-26 LAB — BASIC METABOLIC PANEL
ANION GAP: 6 (ref 5–15)
BUN: 12 mg/dL (ref 6–20)
CALCIUM: 8.3 mg/dL — AB (ref 8.9–10.3)
CO2: 24 mmol/L (ref 22–32)
Chloride: 112 mmol/L — ABNORMAL HIGH (ref 98–111)
Creatinine, Ser: 1.07 mg/dL — ABNORMAL HIGH (ref 0.44–1.00)
GFR calc non Af Amer: 59 mL/min — ABNORMAL LOW (ref >60)
GLUCOSE: 216 mg/dL — AB (ref 70–99)
POTASSIUM: 3.6 mmol/L (ref 3.5–5.1)
Sodium: 142 mmol/L (ref 135–145)

## 2017-12-26 LAB — CBC
HEMATOCRIT: 28.3 % — AB (ref 36.0–46.0)
HEMOGLOBIN: 9.3 g/dL — AB (ref 12.0–15.0)
MCH: 27.8 pg (ref 26.0–34.0)
MCHC: 32.9 g/dL (ref 30.0–36.0)
MCV: 84.5 fL (ref 78.0–100.0)
Platelets: 274 10*3/uL (ref 150–400)
RBC: 3.35 MIL/uL — AB (ref 3.87–5.11)
RDW: 18 % — ABNORMAL HIGH (ref 11.5–15.5)
WBC: 5.6 10*3/uL (ref 4.0–10.5)

## 2017-12-26 LAB — MAGNESIUM: MAGNESIUM: 1.9 mg/dL (ref 1.7–2.4)

## 2017-12-26 LAB — GLUCOSE, CAPILLARY: GLUCOSE-CAPILLARY: 88 mg/dL (ref 70–99)

## 2017-12-26 NOTE — Evaluation (Signed)
Physical Therapy Evaluation Patient Details Name: [name] MRN: [id number] DOB: [birth year] Today's Date: 12/26/2017   History of Present Illness  52 yo female admitted with DKA. Hx of peripheral neuropathy, DM, gastroparesis, depression, anxiety, noncompliance  Clinical Impression  Min encouragement for pt to participate. She stated she was hungry and expecting her breakfast. Reassured pt that session would not interfere with her breakfast. She eventually agreed to work with PT. On eval, she was Min guard assist for mobility. She walked ~150 feet without an assistive device. Mildly unsteady but no overt LOB. Verbally reviewed technique for stair negotiation-recommend use of handrail with 1 vs 2 hands. Discussed f/u PT-pt is agreeable to HHPT. Recommend daily ambulation in hallway with nursing assistance/supervision to increase activity. Will follow during hospital stay.     Follow Up Recommendations Home health PT;Supervision - Intermittent    Equipment Recommendations  None recommended by PT    Recommendations for Other Services       Precautions / Restrictions Precautions Precautions: Fall Restrictions Weight Bearing Restrictions: No      Mobility  Bed Mobility Overal bed mobility: Modified Independent                Transfers Overall transfer level: Modified independent                  Ambulation/Gait Ambulation/Gait assistance: Min guard Gait Distance (Feet): 150 Feet Assistive device: None Gait Pattern/deviations: Step-through pattern     General Gait Details: slow gait speed. Mildly unsteady but no LOB. dyspnea 1/4. Pt tolerated distance well but she did c/o some fatigue.  Stairs            Wheelchair Mobility    Modified Rankin (Stroke Patients Only)       Balance Overall balance assessment: Mild deficits observed, not formally tested                                           Pertinent Vitals/Pain Pain  Assessment: No/denies pain    Home Living Family/patient expects to be discharged to:: Private residence Living Arrangements: Other relatives(sister) Available Help at Discharge: Family;Available PRN/intermittently Type of Home: Apartment Home Access: Stairs to enter Entrance Stairs-Rails: Right Entrance Stairs-Number of Steps: 1 flight Home Layout: One level Home Equipment: None      Prior Function Level of Independence: Independent               Hand Dominance        Extremity/Trunk Assessment   Upper Extremity Assessment Upper Extremity Assessment: Overall WFL for tasks assessed    Lower Extremity Assessment Lower Extremity Assessment: Generalized weakness    Cervical / Trunk Assessment Cervical / Trunk Assessment: Normal  Communication   Communication: No difficulties  Cognition Arousal/Alertness: Awake/alert Behavior During Therapy: WFL for tasks assessed/performed Overall Cognitive Status: Within Functional Limits for tasks assessed                                        General Comments      Exercises     Assessment/Plan    PT Assessment Patient needs continued PT services  PT Problem List Decreased mobility;Decreased strength       PT Treatment Interventions Functional mobility training;Therapeutic exercise;Therapeutic activities;Gait training;Balance training;Patient/family education    PT  Goals (Current goals can be found in the Care Plan section)  Acute Rehab PT Goals Patient Stated Goal: to get back to baseline PT Goal Formulation: With patient Time For Goal Achievement: 01/09/18 Potential to Achieve Goals: Good    Frequency Min 3X/week   Barriers to discharge        Co-evaluation               AM-PAC PT "6 Clicks" Daily Activity  Outcome Measure Difficulty turning over in bed (including adjusting bedclothes, sheets and blankets)?: None Difficulty moving from lying on back to sitting on the side of the  bed? : None Difficulty sitting down on and standing up from a chair with arms (e.g., wheelchair, bedside commode, etc,.)?: None Help needed moving to and from a bed to chair (including a wheelchair)?: A Little Help needed walking in hospital room?: A Little Help needed climbing 3-5 steps with a railing? : A Little 6 Click Score: 21    End of Session Equipment Utilized During Treatment: Gait belt Activity Tolerance: Patient tolerated treatment well Patient left: in chair;with call bell/phone within reach   PT Visit Diagnosis: Muscle weakness (generalized) (M62.81);Unsteadiness on feet (R26.81)    Time: 5747-3403 PT Time Calculation (min) (ACUTE ONLY): 13 min   Charges:   PT Evaluation $PT Eval Moderate Complexity: 1 Mod     PT G Codes:          [name], MPT Pager: [phone]

## 2017-12-26 NOTE — Care Management Note (Signed)
Case Management Note  Patient Details  Name: [name] MRN: [id number] Date of Birth: 12-15-1965  Subjective/Objective:                  hhc orders rn and pt  Action/Plan: [facility] has had in the past agent notified for need of services.  Expected Discharge Date:  12/26/17               Expected Discharge Plan:  [place]  In-House Referral:     Discharge planning Services  CM Consult  Post Acute Care Choice:    Choice offered to:     DME Arranged:    DME Agency:     HH Arranged:  RN, PT [place] Agency:  [place]  Status of Service:  Completed, signed off  If discussed at [place] of Stay Meetings, dates discussed:    Additional Comments:  Leeroy Cha, RN 12/26/2017, 11:31 AM

## 2017-12-26 NOTE — Care Management Note (Signed)
Case Management Note  Patient Details  Name: Malayah Demuro MRN: [id number] Date of Birth: 01-08-1966  Subjective/Objective:                  discharge  Action/Plan: Patient has all needed resources for dx from previous admissions.  Expected Discharge Date:  12/26/17               Expected Discharge Plan:  Home/Self Care  In-House Referral:     Discharge planning Services  CM Consult  Post Acute Care Choice:    Choice offered to:     DME Arranged:    DME Agency:     HH Arranged:    HH Agency:     Status of Service:  Completed, signed off  If discussed at [employer] of Stay Meetings, dates discussed:    Additional Comments:  Leeroy Cha, RN 12/26/2017, 8:59 AM

## 2017-12-26 NOTE — Discharge Summary (Signed)
Physician Discharge Summary  [name] VZD:[id number] DOB: 08/07/1965 DOA: 12/23/2017  PCP: [name], MD  Admit date: 12/23/2017 Discharge date: 12/26/2017  Admitted From: Home.  Disposition:  Home.   Recommendations for Outpatient Follow-up:  1. Follow up with PCP in 1-2 weeks Please obtain BMP/CBC in one week  Recommend follow up with endocrinology in one week.   Equipment/Devices: NONE.  hOME HEALTH : YES  Discharge Condition:stable  CODE STATUS: full code.  Diet recommendation: Heart Healthy / Carb Modified   Brief/Interim Summary:  52 year old with a history of depression, anxiety, peripheral neuropathy, uncontrolled diabetes mellitus-insulin-dependent, medical noncompliance was admitted to the hospital for moderate to severe DKA. Initially she was started on IV fluid, bicarb drip and routine insulin protocol with insulin drip. She did well overnight, her pH improved and her anion gap closed. She was transitioned to subcutaneous insulin and oral diet was started   Discharge Diagnoses:  Active Problems:   Nausea & vomiting   Diabetic neuropathy, type I diabetes mellitus (HCC)   Anxiety and depression   Diabetic gastroparesis (HCC)   AKI (acute kidney injury) ([place])   HLD (hyperlipidemia)   GERD (gastroesophageal reflux disease)   Essential hypertension   Dehydration   DKA, type 1, not at goal [facility])  DKA resolved.  Uncontrolled DM with hyperglycemia from non compliance: Gap still closed .  Potassium wnl.  Restarted her on levemir at 15 units daily , CBG (last 3)  Recent Labs    12/25/17 1147 12/25/17 1604 12/25/17 2053  GLUCAP 261* 130* 173*    Discharged her on levemir and SSI.      AKI;  Possibly from DKA. Improved.    GERD stable.    Dehydration:  Improved.    Hypertension:  Slightly hypertensive this am.  Restart her home meds on discharge.    diabetic gastroparesis:  Resume home meds.    H/o anxiety and  depression:  Home meds restarted.    Urinary retention:  RESOLVED     Discharge Instructions  Discharge Instructions    Diet - low sodium heart healthy   Complete by:  As directed    Discharge instructions   Complete by:  As directed    Please follow up with PCP regarding permanent IV access.  Please follow up with PCP in one week .     Allergies as of 12/26/2017      Reactions   Anesthetics, Amide Nausea And Vomiting   Penicillins Diarrhea, Nausea And Vomiting, Other (See Comments)   Has patient had a PCN reaction causing immediate rash, facial/tongue/throat swelling, SOB or lightheadedness with hypotension: Yes Has patient had a PCN reaction causing severe rash involving mucus membranes or skin necrosis: No Has patient had a PCN reaction that required hospitalization No Has patient had a PCN reaction occurring within the last 10 years: Yes  If all of the above answers are "NO", then may proceed with Cephalosporin use.   Buprenorphine Hcl Rash   Encainide Nausea And Vomiting   Metoclopramide Other (See Comments)   Dystonia, muscle rigidity.       Medication List    STOP taking these medications   clonazePAM 0.5 MG disintegrating tablet Commonly known as:  KLONOPIN   cyanocobalamin 1000 MCG tablet   loratadine 10 MG tablet Commonly known as:  CLARITIN   promethazine 25 MG suppository Commonly known as:  PHENERGAN     TAKE these medications   bisacodyl 5 MG EC tablet Commonly known as:  DULCOLAX  Take 5 mg by mouth daily as needed for moderate constipation.   busPIRone 10 MG tablet Commonly known as:  BUSPAR Take 1 tablet (10 mg total) by mouth 3 (three) times daily.   fluticasone 50 MCG/ACT nasal spray Commonly known as:  FLONASE Place 2 sprays into both nostrils daily.   furosemide 20 MG tablet Commonly known as:  LASIX Take 1 tablet (20 mg total) by mouth daily as needed for fluid or edema.   insulin aspart 100 UNIT/ML injection Commonly known  as:  novoLOG Inject 0-12 Units into the skin 4 (four) times daily -  before meals and at bedtime. Uses a sliding scale: 70-119 = 0 units, 120-150 = 2 units, 151-180 =  4 units, 181-210 = 5 units, 211-240 = 6 units, 241-270 = 8 units, 271-300 = 9 units, 301-330 = 10 units, 331-360 = 11 units, 361-400 = 12 units, greater than 400 notify MD.   insulin detemir 100 UNIT/ML injection Commonly known as:  LEVEMIR Inject 0.15 mLs (15 Units total) into the skin every morning.   mirtazapine 15 MG tablet Commonly known as:  REMERON Take 1 tablet (15 mg total) by mouth at bedtime.   ondansetron 4 MG disintegrating tablet Commonly known as:  ZOFRAN ODT Take 1 tablet (4 mg total) by mouth every 8 (eight) hours as needed for nausea or vomiting. What changed:  Another medication with the same name was removed. Continue taking this medication, and follow the directions you see here.   pantoprazole 40 MG tablet Commonly known as:  PROTONIX Take 1 tablet (40 mg total) by mouth daily.   polyethylene glycol packet Commonly known as:  MIRALAX / GLYCOLAX Take 17 g by mouth daily as needed for mild constipation.   pregabalin 150 MG capsule Commonly known as:  LYRICA Take 1 capsule (150 mg total) by mouth daily.   pyridostigmine 60 MG tablet Commonly known as:  MESTINON Take 0.5 tablets (30 mg total) by mouth 3 (three) times daily. Take 1/2 with meals   SYRINGE 3CC/22GX1-1/2" 22G X 1-1/2" 3 ML Misc Inject 1,000 mcg into the muscle daily.      Follow-up Information    [name], MD. Schedule an appointment as soon as possible for a visit in 1 week(s).   Specialty:  Family Medicine Contact information: [address] [place] [address] [phone]          Allergies  Allergen Reactions  . Anesthetics, Amide Nausea And Vomiting  . Penicillins Diarrhea, Nausea And Vomiting and Other (See Comments)    Has patient had a PCN reaction causing immediate rash, facial/tongue/throat  swelling, SOB or lightheadedness with hypotension: Yes Has patient had a PCN reaction causing severe rash involving mucus membranes or skin necrosis: No Has patient had a PCN reaction that required hospitalization No Has patient had a PCN reaction occurring within the last 10 years: Yes  If all of the above answers are "NO", then may proceed with Cephalosporin use.   . Buprenorphine Hcl Rash  . Encainide Nausea And Vomiting  . Metoclopramide Other (See Comments)    Dystonia, muscle rigidity.     Consultations: NONE.   Procedures/Studies: Dg Chest 1 View  Result Date: 12/10/2017 CLINICAL DATA:  52 year old female with abdominal pain and vomiting. Diabetic ketoacidosis. Shortness of breath. EXAM: CHEST  1 VIEW COMPARISON:  Chest and abdominal radiographs 11/22/2017 and earlier. FINDINGS: Portable AP semi upright view at 1811 hours. Lung volumes and mediastinal contours remain normal. Allowing for portable  technique the lungs are clear. Visualized tracheal air column is within normal limits. Negative visible bowel gas pattern. No osseous abnormality identified. IMPRESSION: Negative.  No acute cardiopulmonary abnormality. Electronically Signed   By: Genevie Ann M.D.   On: 12/10/2017 18:34   Dg Abd Acute W/chest  Result Date: 12/23/2017 CLINICAL DATA:  Abdominal and back pain. Shortness of breath. History of gastroparesis. EXAM: DG ABDOMEN ACUTE W/ 1V CHEST COMPARISON:  Single-view of the chest 12/10/2017. Plain films chest and abdomen 11/22/2017. FINDINGS: Single-view of the chest demonstrates clear lungs and normal heart size. No pneumothorax or pleural effusion. Two views of the abdomen show no free intraperitoneal air. There is gaseous distention of the stomach. Small and large bowel are unremarkable. No unexpected abdominal calcification or focal bony abnormality. IMPRESSION: Gaseous distention of the stomach may be related to the patient's history of diabetic gastroparesis. The examination is  otherwise negative. Electronically Signed   By: [name] Rise M.D.   On: 12/23/2017 19:01   Dg Abd Portable 1v  Result Date: 12/13/2017 CLINICAL DATA:  Nausea.  History of diabetes.  GERD, hypertension. EXAM: PORTABLE ABDOMEN - 1 VIEW COMPARISON:  11/22/2017 FINDINGS: Bowel gas pattern is nonobstructive. Moderate stool burden. Surgical clips are seen in the RIGHT UPPER QUADRANT. No organomegaly or abnormal calcifications. Visualized osseous structures have a normal appearance. IMPRESSION: Negative. Electronically Signed   By: Nolon Nations M.D.   On: 12/13/2017 08:37   Irpicc Placement Left >5 Yrs Inc Img Guide  Result Date: 12/23/2017 INDICATION: 52 year old female with a history of DKA EXAM: PICC LINE PLACEMENT WITH ULTRASOUND AND FLUOROSCOPIC GUIDANCE MEDICATIONS: None ANESTHESIA/SEDATION: None FLUOROSCOPY TIME:  Fluoroscopy Time: 0 minutes 42 seconds (1.9 mGy). COMPLICATIONS: None PROCEDURE: Informed written consent was obtained from the patient after a thorough discussion of the procedural risks, benefits and alternatives. All questions were addressed. Maximal Sterile Barrier Technique was utilized including caps, mask, sterile gowns, sterile gloves, sterile drape, hand hygiene and skin antiseptic. A timeout was performed prior to the initiation of the procedure. Patient was position in the supine position on the fluoroscopy table with the right arm abducted 90 degrees. Ultrasound survey of the upper extremity was performed with images stored and sent to PACs. The left brachial vein was selected for access. Once the patient was prepped and draped in the usual sterile fashion, the skin and subcutaneous tissues were generously infiltrated with 1% lidocaine for local anesthesia. A micropuncture access kit was then used to access the targeted vein. Wire was passed centrally, confirmed to be within the venous system under fluoroscopy. A small stab incision was made with an 11 blade scalpel and the  sheath was then placed over the wire. Estimated length of the catheter was then performed with the indwelling wire. Catheter was amputated at 41 cm length and placed with coaxial wire through the peel-away. Double-lumen, power injectable PICC in the left brachial vein. Tip confirmed at the cavoatrial junction, and the catheter is ready for use. Stat lock was placed. Patient tolerated the procedure well and remained hemodynamically stable throughout. No complications were encountered and no significant blood loss was encountered. IMPRESSION: Status post left upper extremity PICC. Signed, Dulcy Fanny. Dellia Nims, RPVI [facility] Radiology PLAN: Anticipate future PICC placement to be difficult, with limited venous access and small caliber veins. Electronically Signed   By: Corrie Mckusick D.O.   On: 12/23/2017 17:33   Ir Picc Placement Right >5 Yrs Inc Img Guide  Result Date:  12/13/2017 CLINICAL DATA:  Diabetic ketoacidosis, poor peripheral IV access EXAM: PICC PLACEMENT WITH ULTRASOUND AND FLUOROSCOPY FLUOROSCOPY TIME:  1.3 minutes; 196 uGym2 DAP TECHNIQUE: After written informed consent was obtained, patient was placed in the supine position on angiographic table. Patency of the right brachial vein was confirmed with ultrasound with image documentation. An appropriate skin site was determined. Skin site was marked. Region was prepped using maximum barrier technique including cap and mask, sterile gown, sterile gloves, large sterile sheet, and Chlorhexidine as cutaneous antisepsis. The region was infiltrated locally with 1% lidocaine. Under real-time ultrasound guidance, the right brachial vein was accessed with a 21 gauge micropuncture needle; the needle tip within the vein was confirmed with ultrasound image documentation. Needle exchanged over a 018 guidewire for a peel-away sheath, through which a 5-French double-lumen power injectable PICC trimmed to 30cm was  advanced, positioned with its tip near the cavoatrial junction. There was focal resistance in the central subclavian vein, overcome by using 2 parallel 018 guidewires. Spot chest radiograph confirms appropriate catheter position. Catheter was flushed per protocol and secured externally. The patient tolerated procedure well. COMPLICATIONS: COMPLICATIONS none IMPRESSION: 1. Technically successful five [place] double lumen power injectable PICC placement Electronically Signed   By: Lucrezia Europe M.D.   On: 12/13/2017 16:49       Subjective: No new complaints.   Discharge Exam: Vitals:   12/25/17 2056 12/26/17 0405  BP: 117/71 127/75  Pulse: 84 81  Resp: 16 17  Temp: 98.1 F (36.7 C) 98.1 F (36.7 C)  SpO2: 100% 99%   Vitals:   12/25/17 1137 12/25/17 1501 12/25/17 2056 12/26/17 0405  BP: 128/68 105/64 117/71 127/75  Pulse: 95 95 84 81  Resp: 20 18 16 17   Temp: 98.9 F (37.2 C) 98.6 F (37 C) 98.1 F (36.7 C) 98.1 F (36.7 C)  TempSrc: Oral Oral Oral Oral  SpO2: 100% 99% 100% 99%  Weight:      Height:        General: Pt is alert, awake, not in acute distress Cardiovascular: RRR, S1/S2 +, no rubs, no gallops Respiratory: CTA bilaterally, no wheezing, no rhonchi Abdominal: Soft, NT, ND, bowel sounds + Extremities: no edema, no cyanosis    The results of significant diagnostics from this hospitalization (including imaging, microbiology, ancillary and laboratory) are listed below for reference.     Microbiology: Recent Results (from the past 240 hour(s))  Urine culture     Status: None   Collection Time: 12/23/17  2:30 PM  Result Value Ref Range Status   Specimen Description   Final    URINE, CLEAN CATCH Performed at [facility], [place] [address] [place], [place] [address]    Special Requests   Final    NONE Performed at [facility], [place] [address], [place], [place] [address]    Culture   Final    NO GROWTH Performed at [facility], [place] [address], [place], [place] [address]    Report Status 12/24/2017 FINAL  Final     Labs: BNP (last 3 results) No results for input(s): BNP in the last 8760 hours. Basic Metabolic Panel: Recent Labs  Lab 12/23/17 1806  12/24/17 0555  12/24/17 1353 12/24/17 2059 12/24/17 2356 12/25/17 0500 12/26/17 0357  NA  --    < > 137   < > 135 129* 136 134* 142  K  --    < > 3.5   < > 4.2 3.4*  3.8 4.0 3.6  CL  --    < > 105   < > 106 104 108 105 112*  CO2  --    < > 18*   < > 19* 19* 21* 22 24  GLUCOSE  --    < > 132*   < > 123* 600* 192* 297* 216*  BUN  --    < > 33*   < > 30* 22* 23* 19 12  CREATININE  --    < > 1.84*   < > 1.58* 1.44* 1.36* 1.34* 1.07*  CALCIUM  --    < > 7.8*   < > 7.9* 7.0* 7.9* 7.9* 8.3*  MG 2.3  --  1.7  --   --   --   --  2.0 1.9   < > = values in this interval not displayed.   Liver Function Tests: Recent Labs  Lab 12/23/17 1007  AST 20  ALT 17  ALKPHOS 89  BILITOT 2.5*  PROT 7.5  ALBUMIN 3.9   Recent Labs  Lab 12/23/17 1007  LIPASE 24   No results for input(s): AMMONIA in the last 168 hours. CBC: Recent Labs  Lab 12/23/17 1007 12/23/17 1048 12/23/17 1806 12/25/17 0500 12/26/17 0357  WBC 19.3*  --  28.6* 12.3* 5.6  NEUTROABS 17.3*  --   --   --   --   HGB 11.1* 12.2 10.2* 10.0* 9.3*  HCT 36.5 36.0 34.5* 30.8* 28.3*  MCV 91.0  --  93.0 83.9 84.5  PLT 538*  --  553* 300 274   Cardiac Enzymes: No results for input(s): CKTOTAL, CKMB, CKMBINDEX, TROPONINI in the last 168 hours. BNP: Invalid input(s): POCBNP CBG: Recent Labs  Lab 12/24/17 2145 12/25/17 0756 12/25/17 1147 12/25/17 1604 12/25/17 2053  GLUCAP 160* 362* 261* 130* 173*   D-Dimer No results for input(s): DDIMER in the last 72 hours. Hgb A1c Recent Labs    12/24/17 0555  HGBA1C 8.8*   Lipid Profile No results for input(s): CHOL, HDL, LDLCALC, TRIG, CHOLHDL, LDLDIRECT in the last 72 hours. Thyroid function studies No results for input(s): TSH,  T4TOTAL, T3FREE, THYROIDAB in the last 72 hours.  Invalid input(s): FREET3 Anemia work up No results for input(s): VITAMINB12, FOLATE, FERRITIN, TIBC, IRON, RETICCTPCT in the last 72 hours. Urinalysis    Component Value Date/Time   COLORURINE YELLOW 12/23/2017 1430   APPEARANCEUR CLEAR 12/23/2017 1430   LABSPEC 1.018 12/23/2017 1430   PHURINE 5.0 12/23/2017 1430   GLUCOSEU >=500 (A) 12/23/2017 1430   HGBUR SMALL (A) 12/23/2017 1430   BILIRUBINUR NEGATIVE 12/23/2017 1430   BILIRUBINUR neg 01/17/2017 1533   KETONESUR 80 (A) 12/23/2017 1430   PROTEINUR 30 (A) 12/23/2017 1430   UROBILINOGEN 0.2 01/17/2017 1533   UROBILINOGEN 0.2 04/27/2015 1608   NITRITE NEGATIVE 12/23/2017 1430   LEUKOCYTESUR NEGATIVE 12/23/2017 1430   Sepsis Labs Invalid input(s): PROCALCITONIN,  WBC,  LACTICIDVEN Microbiology Recent Results (from the past 240 hour(s))  Urine culture     Status: None   Collection Time: 12/23/17  2:30 PM  Result Value Ref Range Status   Specimen Description   Final    URINE, CLEAN CATCH Performed at [facility], [place] [address], [place], [place] [address]    Special Requests   Final    NONE Performed at [facility], [place] [address] [place], [place] [address]    Culture   Final    NO GROWTH Performed at  [facility], [place] [address] [place], [place] [address]    Report Status 12/24/2017 FINAL  Final     Time coordinating discharge: 32 minutes  SIGNED:   Hosie Poisson, MD  [facility] 12/26/2017, 8:19 AM Pager   If 7PM-7AM, please contact night-coverage [email or web address] Password [email or web address]

## 2017-12-27 ENCOUNTER — Telehealth: Payer: Self-pay | Admitting: Family Medicine

## 2017-12-27 LAB — GLUCOSE, CAPILLARY: Glucose-Capillary: 306 mg/dL — ABNORMAL HIGH (ref 70–99)

## 2017-12-27 NOTE — Telephone Encounter (Signed)
Call placed to patient [phone] to check on her status and confirm upcoming appointment with provider. Patient stated that she needed to cancel her appointment with provider. She was feeling "ok, but queasy" and would rather reschedule appointment to a day when she felt better. Patient confirmed that she was taking her medications as instructed. Informed patient that Lowell Guitar., case manager, will contact her during the upcoming week to check on her status. Patient was appreciative and had no further questions.

## 2017-12-30 ENCOUNTER — Ambulatory Visit: Payer: Self-pay | Admitting: Family Medicine

## 2018-01-01 NOTE — Addendum Note (Signed)
Addended byCharlott Rakes on: 01/01/2018 05:13 PM   Modules accepted: Level of Service

## 2018-01-03 ENCOUNTER — Telehealth: Payer: Self-pay

## 2018-01-03 NOTE — Telephone Encounter (Signed)
Attempted to contact the patient to check on her.  Call placed to # [phone] (M) and a HIPAA compliant voicemail message was left requesting a call back to # [phone].

## 2018-01-04 ENCOUNTER — Other Ambulatory Visit: Payer: Self-pay

## 2018-01-04 ENCOUNTER — Inpatient Hospital Stay (HOSPITAL_COMMUNITY): Payer: Medicare Other

## 2018-01-04 ENCOUNTER — Emergency Department (HOSPITAL_COMMUNITY): Payer: Medicare Other

## 2018-01-04 ENCOUNTER — Inpatient Hospital Stay (HOSPITAL_COMMUNITY)
Admission: EM | Admit: 2018-01-04 | Discharge: 2018-01-25 | DRG: 233 | Disposition: A | Discharge status: Skilled Nursing Facility | Payer: Medicare Other | Attending: Cardiothoracic Surgery | Admitting: Cardiothoracic Surgery

## 2018-01-04 DIAGNOSIS — E785 Hyperlipidemia, unspecified: Secondary | ICD-10-CM | POA: Diagnosis present

## 2018-01-04 DIAGNOSIS — J90 Pleural effusion, not elsewhere classified: Secondary | ICD-10-CM

## 2018-01-04 DIAGNOSIS — R279 Unspecified lack of coordination: Secondary | ICD-10-CM | POA: Diagnosis not present

## 2018-01-04 DIAGNOSIS — Z452 Encounter for adjustment and management of vascular access device: Secondary | ICD-10-CM

## 2018-01-04 DIAGNOSIS — F32A Depression, unspecified: Secondary | ICD-10-CM | POA: Diagnosis present

## 2018-01-04 DIAGNOSIS — Z01810 Encounter for preprocedural cardiovascular examination: Secondary | ICD-10-CM

## 2018-01-04 DIAGNOSIS — Z560 Unemployment, unspecified: Secondary | ICD-10-CM

## 2018-01-04 DIAGNOSIS — E1043 Type 1 diabetes mellitus with diabetic autonomic (poly)neuropathy: Secondary | ICD-10-CM | POA: Diagnosis not present

## 2018-01-04 DIAGNOSIS — M6281 Muscle weakness (generalized): Secondary | ICD-10-CM | POA: Diagnosis not present

## 2018-01-04 DIAGNOSIS — Z9111 Patient's noncompliance with dietary regimen: Secondary | ICD-10-CM | POA: Diagnosis not present

## 2018-01-04 DIAGNOSIS — I2510 Atherosclerotic heart disease of native coronary artery without angina pectoris: Secondary | ICD-10-CM | POA: Diagnosis not present

## 2018-01-04 DIAGNOSIS — R2689 Other abnormalities of gait and mobility: Secondary | ICD-10-CM | POA: Diagnosis not present

## 2018-01-04 DIAGNOSIS — Z79899 Other long term (current) drug therapy: Secondary | ICD-10-CM

## 2018-01-04 DIAGNOSIS — R0602 Shortness of breath: Secondary | ICD-10-CM

## 2018-01-04 DIAGNOSIS — E1042 Type 1 diabetes mellitus with diabetic polyneuropathy: Secondary | ICD-10-CM | POA: Diagnosis present

## 2018-01-04 DIAGNOSIS — D62 Acute posthemorrhagic anemia: Secondary | ICD-10-CM | POA: Diagnosis not present

## 2018-01-04 DIAGNOSIS — K219 Gastro-esophageal reflux disease without esophagitis: Secondary | ICD-10-CM | POA: Diagnosis present

## 2018-01-04 DIAGNOSIS — J9811 Atelectasis: Secondary | ICD-10-CM | POA: Diagnosis not present

## 2018-01-04 DIAGNOSIS — R488 Other symbolic dysfunctions: Secondary | ICD-10-CM | POA: Diagnosis not present

## 2018-01-04 DIAGNOSIS — R112 Nausea with vomiting, unspecified: Secondary | ICD-10-CM | POA: Diagnosis not present

## 2018-01-04 DIAGNOSIS — R030 Elevated blood-pressure reading, without diagnosis of hypertension: Secondary | ICD-10-CM | POA: Diagnosis not present

## 2018-01-04 DIAGNOSIS — Z743 Need for continuous supervision: Secondary | ICD-10-CM | POA: Diagnosis not present

## 2018-01-04 DIAGNOSIS — E10649 Type 1 diabetes mellitus with hypoglycemia without coma: Secondary | ICD-10-CM | POA: Diagnosis not present

## 2018-01-04 DIAGNOSIS — R000 Tachycardia, unspecified: Secondary | ICD-10-CM | POA: Diagnosis not present

## 2018-01-04 DIAGNOSIS — G2401 Drug induced subacute dyskinesia: Secondary | ICD-10-CM | POA: Diagnosis present

## 2018-01-04 DIAGNOSIS — Z8249 Family history of ischemic heart disease and other diseases of the circulatory system: Secondary | ICD-10-CM

## 2018-01-04 DIAGNOSIS — F418 Other specified anxiety disorders: Secondary | ICD-10-CM | POA: Diagnosis not present

## 2018-01-04 DIAGNOSIS — R109 Unspecified abdominal pain: Secondary | ICD-10-CM

## 2018-01-04 DIAGNOSIS — N179 Acute kidney failure, unspecified: Secondary | ICD-10-CM | POA: Diagnosis not present

## 2018-01-04 DIAGNOSIS — R6510 Systemic inflammatory response syndrome (SIRS) of non-infectious origin without acute organ dysfunction: Secondary | ICD-10-CM | POA: Diagnosis not present

## 2018-01-04 DIAGNOSIS — D72829 Elevated white blood cell count, unspecified: Secondary | ICD-10-CM | POA: Diagnosis not present

## 2018-01-04 DIAGNOSIS — F329 Major depressive disorder, single episode, unspecified: Secondary | ICD-10-CM | POA: Diagnosis not present

## 2018-01-04 DIAGNOSIS — Z4682 Encounter for fitting and adjustment of non-vascular catheter: Secondary | ICD-10-CM | POA: Diagnosis not present

## 2018-01-04 DIAGNOSIS — J939 Pneumothorax, unspecified: Secondary | ICD-10-CM | POA: Diagnosis not present

## 2018-01-04 DIAGNOSIS — R0989 Other specified symptoms and signs involving the circulatory and respiratory systems: Secondary | ICD-10-CM

## 2018-01-04 DIAGNOSIS — E1165 Type 2 diabetes mellitus with hyperglycemia: Secondary | ICD-10-CM | POA: Diagnosis not present

## 2018-01-04 DIAGNOSIS — E1040 Type 1 diabetes mellitus with diabetic neuropathy, unspecified: Secondary | ICD-10-CM | POA: Diagnosis not present

## 2018-01-04 DIAGNOSIS — Z833 Family history of diabetes mellitus: Secondary | ICD-10-CM

## 2018-01-04 DIAGNOSIS — Z7951 Long term (current) use of inhaled steroids: Secondary | ICD-10-CM

## 2018-01-04 DIAGNOSIS — I129 Hypertensive chronic kidney disease with stage 1 through stage 4 chronic kidney disease, or unspecified chronic kidney disease: Secondary | ICD-10-CM | POA: Diagnosis not present

## 2018-01-04 DIAGNOSIS — Z888 Allergy status to other drugs, medicaments and biological substances status: Secondary | ICD-10-CM

## 2018-01-04 DIAGNOSIS — Z951 Presence of aortocoronary bypass graft: Secondary | ICD-10-CM

## 2018-01-04 DIAGNOSIS — E8770 Fluid overload, unspecified: Secondary | ICD-10-CM | POA: Diagnosis not present

## 2018-01-04 DIAGNOSIS — R110 Nausea: Secondary | ICD-10-CM | POA: Diagnosis not present

## 2018-01-04 DIAGNOSIS — I959 Hypotension, unspecified: Secondary | ICD-10-CM | POA: Diagnosis not present

## 2018-01-04 DIAGNOSIS — Z01818 Encounter for other preprocedural examination: Secondary | ICD-10-CM

## 2018-01-04 DIAGNOSIS — I214 Non-ST elevation (NSTEMI) myocardial infarction: Secondary | ICD-10-CM

## 2018-01-04 DIAGNOSIS — N183 Chronic kidney disease, stage 3 (moderate): Secondary | ICD-10-CM | POA: Diagnosis present

## 2018-01-04 DIAGNOSIS — E1110 Type 2 diabetes mellitus with ketoacidosis without coma: Secondary | ICD-10-CM | POA: Diagnosis present

## 2018-01-04 DIAGNOSIS — G43A0 Cyclical vomiting, not intractable: Secondary | ICD-10-CM

## 2018-01-04 DIAGNOSIS — E876 Hypokalemia: Secondary | ICD-10-CM | POA: Diagnosis not present

## 2018-01-04 DIAGNOSIS — D509 Iron deficiency anemia, unspecified: Secondary | ICD-10-CM | POA: Diagnosis present

## 2018-01-04 DIAGNOSIS — F419 Anxiety disorder, unspecified: Secondary | ICD-10-CM | POA: Diagnosis present

## 2018-01-04 DIAGNOSIS — E0810 Diabetes mellitus due to underlying condition with ketoacidosis without coma: Secondary | ICD-10-CM | POA: Diagnosis not present

## 2018-01-04 DIAGNOSIS — K3184 Gastroparesis: Secondary | ICD-10-CM | POA: Diagnosis present

## 2018-01-04 DIAGNOSIS — E1022 Type 1 diabetes mellitus with diabetic chronic kidney disease: Secondary | ICD-10-CM | POA: Diagnosis present

## 2018-01-04 DIAGNOSIS — I25110 Atherosclerotic heart disease of native coronary artery with unstable angina pectoris: Secondary | ICD-10-CM | POA: Diagnosis not present

## 2018-01-04 DIAGNOSIS — R140 Abdominal distension (gaseous): Secondary | ICD-10-CM | POA: Diagnosis not present

## 2018-01-04 DIAGNOSIS — E1010 Type 1 diabetes mellitus with ketoacidosis without coma: Secondary | ICD-10-CM | POA: Diagnosis not present

## 2018-01-04 DIAGNOSIS — J189 Pneumonia, unspecified organism: Secondary | ICD-10-CM | POA: Diagnosis not present

## 2018-01-04 DIAGNOSIS — R41841 Cognitive communication deficit: Secondary | ICD-10-CM | POA: Diagnosis not present

## 2018-01-04 DIAGNOSIS — Z880 Allergy status to penicillin: Secondary | ICD-10-CM

## 2018-01-04 DIAGNOSIS — I25119 Atherosclerotic heart disease of native coronary artery with unspecified angina pectoris: Secondary | ICD-10-CM | POA: Diagnosis not present

## 2018-01-04 DIAGNOSIS — R197 Diarrhea, unspecified: Secondary | ICD-10-CM | POA: Diagnosis not present

## 2018-01-04 DIAGNOSIS — Z9119 Patient's noncompliance with other medical treatment and regimen: Secondary | ICD-10-CM

## 2018-01-04 DIAGNOSIS — Z9689 Presence of other specified functional implants: Secondary | ICD-10-CM

## 2018-01-04 DIAGNOSIS — E860 Dehydration: Secondary | ICD-10-CM | POA: Diagnosis not present

## 2018-01-04 DIAGNOSIS — I340 Nonrheumatic mitral (valve) insufficiency: Secondary | ICD-10-CM | POA: Diagnosis not present

## 2018-01-04 DIAGNOSIS — I213 ST elevation (STEMI) myocardial infarction of unspecified site: Secondary | ICD-10-CM | POA: Diagnosis not present

## 2018-01-04 DIAGNOSIS — Z794 Long term (current) use of insulin: Secondary | ICD-10-CM

## 2018-01-04 DIAGNOSIS — R0689 Other abnormalities of breathing: Secondary | ICD-10-CM | POA: Diagnosis not present

## 2018-01-04 DIAGNOSIS — J984 Other disorders of lung: Secondary | ICD-10-CM | POA: Diagnosis not present

## 2018-01-04 DIAGNOSIS — I10 Essential (primary) hypertension: Secondary | ICD-10-CM | POA: Diagnosis not present

## 2018-01-04 DIAGNOSIS — Z884 Allergy status to anesthetic agent status: Secondary | ICD-10-CM

## 2018-01-04 DIAGNOSIS — R0682 Tachypnea, not elsewhere classified: Secondary | ICD-10-CM | POA: Diagnosis not present

## 2018-01-04 DIAGNOSIS — R079 Chest pain, unspecified: Secondary | ICD-10-CM | POA: Diagnosis not present

## 2018-01-04 LAB — BASIC METABOLIC PANEL
ANION GAP: 24 — AB (ref 5–15)
Anion gap: 33 — ABNORMAL HIGH (ref 5–15)
Anion gap: 22 — ABNORMAL HIGH (ref 5–15)
BUN: 28 mg/dL — ABNORMAL HIGH (ref 6–20)
BUN: 27 mg/dL — AB (ref 6–20)
BUN: 27 mg/dL — AB (ref 6–20)
CALCIUM: 8.8 mg/dL — AB (ref 8.9–10.3)
CALCIUM: 8.1 mg/dL — AB (ref 8.9–10.3)
CO2: 8 mmol/L — AB (ref 22–32)
CO2: 9 mmol/L — ABNORMAL LOW (ref 22–32)
CO2: 12 mmol/L — ABNORMAL LOW (ref 22–32)
Calcium: 8.5 mg/dL — ABNORMAL LOW (ref 8.9–10.3)
Chloride: 97 mmol/L — ABNORMAL LOW (ref 98–111)
Chloride: 112 mmol/L — ABNORMAL HIGH (ref 98–111)
Chloride: 112 mmol/L — ABNORMAL HIGH (ref 98–111)
Creatinine, Ser: 2.37 mg/dL — ABNORMAL HIGH (ref 0.44–1.00)
Creatinine, Ser: 2.17 mg/dL — ABNORMAL HIGH (ref 0.44–1.00)
Creatinine, Ser: 2.04 mg/dL — ABNORMAL HIGH (ref 0.44–1.00)
GFR calc Af Amer: 29 mL/min — ABNORMAL LOW (ref >60)
GFR calc Af Amer: 32 mL/min — ABNORMAL LOW (ref >60)
GFR calc non Af Amer: 23 mL/min — ABNORMAL LOW (ref >60)
GFR calc non Af Amer: 27 mL/min — ABNORMAL LOW (ref >60)
GFR, EST AFRICAN AMERICAN: 26 mL/min — AB (ref >60)
GFR, EST NON AFRICAN AMERICAN: 25 mL/min — AB (ref >60)
GLUCOSE: 892 mg/dL — AB (ref 70–99)
GLUCOSE: 489 mg/dL — AB (ref 70–99)
GLUCOSE: 391 mg/dL — AB (ref 70–99)
POTASSIUM: 3.7 mmol/L (ref 3.5–5.1)
Potassium: 5.2 mmol/L — ABNORMAL HIGH (ref 3.5–5.1)
Potassium: 3.9 mmol/L (ref 3.5–5.1)
Sodium: 138 mmol/L (ref 135–145)
Sodium: 145 mmol/L (ref 135–145)
Sodium: 146 mmol/L — ABNORMAL HIGH (ref 135–145)

## 2018-01-04 LAB — CBC
HCT: 34.7 % — ABNORMAL LOW (ref 36.0–46.0)
Hemoglobin: 10.2 g/dL — ABNORMAL LOW (ref 12.0–15.0)
MCH: 27.9 pg (ref 26.0–34.0)
MCHC: 29.4 g/dL — AB (ref 30.0–36.0)
MCV: 94.8 fL (ref 78.0–100.0)
PLATELETS: 566 10*3/uL — AB (ref 150–400)
RBC: 3.66 MIL/uL — AB (ref 3.87–5.11)
RDW: 18 % — ABNORMAL HIGH (ref 11.5–15.5)
WBC: 23.2 10*3/uL — ABNORMAL HIGH (ref 4.0–10.5)

## 2018-01-04 LAB — GLUCOSE, CAPILLARY
GLUCOSE-CAPILLARY: 482 mg/dL — AB (ref 70–99)
GLUCOSE-CAPILLARY: 240 mg/dL — AB (ref 70–99)
GLUCOSE-CAPILLARY: 179 mg/dL — AB (ref 70–99)
Glucose-Capillary: 565 mg/dL (ref 70–99)
Glucose-Capillary: 371 mg/dL — ABNORMAL HIGH (ref 70–99)
Glucose-Capillary: 270 mg/dL — ABNORMAL HIGH (ref 70–99)
Glucose-Capillary: 132 mg/dL — ABNORMAL HIGH (ref 70–99)
Glucose-Capillary: 128 mg/dL — ABNORMAL HIGH (ref 70–99)

## 2018-01-04 LAB — I-STAT CHEM 8, ED
BUN: 28 mg/dL — AB (ref 6–20)
CREATININE: 1.7 mg/dL — AB (ref 0.44–1.00)
Calcium, Ion: 1.13 mmol/L — ABNORMAL LOW (ref 1.15–1.40)
Chloride: 103 mmol/L (ref 98–111)
HCT: 34 % — ABNORMAL LOW (ref 36.0–46.0)
Hemoglobin: 11.6 g/dL — ABNORMAL LOW (ref 12.0–15.0)
Potassium: 5 mmol/L (ref 3.5–5.1)
Sodium: 135 mmol/L (ref 135–145)
TCO2: 10 mmol/L — AB (ref 22–32)

## 2018-01-04 LAB — BLOOD GAS, VENOUS
ACID-BASE DEFICIT: 21.2 mmol/L — AB (ref 0.0–2.0)
Bicarbonate: 7.4 mmol/L — ABNORMAL LOW (ref 20.0–28.0)
O2 SAT: 68.8 %
PATIENT TEMPERATURE: 98.6
PCO2 VEN: 26.1 mmHg — AB (ref 44.0–60.0)
pH, Ven: 7.082 — CL (ref 7.250–7.430)
pO2, Ven: 52.5 mmHg — ABNORMAL HIGH (ref 32.0–45.0)

## 2018-01-04 LAB — CBG MONITORING, ED: Glucose-Capillary: >600 mg/dL (ref 70–99)

## 2018-01-04 LAB — TROPONIN I
TROPONIN I: 2.37 ng/mL — AB (ref <0.03)
Troponin I: 10.6 ng/mL (ref <0.03)

## 2018-01-04 LAB — PROTIME-INR
INR: 1.27
Prothrombin Time: 15.8 seconds — ABNORMAL HIGH (ref 11.4–15.2)

## 2018-01-04 LAB — MRSA PCR SCREENING: MRSA BY PCR: NEGATIVE

## 2018-01-04 LAB — APTT: aPTT: 56 seconds — ABNORMAL HIGH (ref 24–36)

## 2018-01-04 LAB — I-STAT CG4 LACTIC ACID, ED: Lactic Acid, Venous: 6.42 mmol/L (ref 0.5–1.9)

## 2018-01-04 LAB — I-STAT TROPONIN, ED: TROPONIN I, POC: 0.15 ng/mL — AB (ref 0.00–0.08)

## 2018-01-04 LAB — I-STAT BETA HCG BLOOD, ED (MC, WL, AP ONLY): I-stat hCG, quantitative: <5 m[IU]/mL (ref <5)

## 2018-01-04 LAB — ETHANOL: Alcohol, Ethyl (B): <10 mg/dL (ref <10)

## 2018-01-04 MED ORDER — ONDANSETRON HCL 4 MG/2ML IJ SOLN
4.0000 mg | Freq: Four times a day (QID) | INTRAMUSCULAR | Status: DC | PRN
  Administered 2018-01-04 – 2018-01-05 (×3): 4 mg via INTRAVENOUS
  Filled 2018-01-04 (×3): qty 2

## 2018-01-04 MED ORDER — DEXTROSE-NACL 5-0.45 % IV SOLN
INTRAVENOUS | Status: DC
  Administered 2018-01-04: 23:00:00 via INTRAVENOUS
  Administered 2018-01-06: 75 mL/h via INTRAVENOUS

## 2018-01-04 MED ORDER — SODIUM CHLORIDE 0.9 % IV SOLN
INTRAVENOUS | Status: AC
  Administered 2018-01-04 (×2): via INTRAVENOUS

## 2018-01-04 MED ORDER — ONDANSETRON HCL 4 MG PO TABS: 4.0000 mg | ORAL_TABLET | Freq: Four times a day (QID) | ORAL | Status: DC | PRN

## 2018-01-04 MED ORDER — BUSPIRONE HCL 10 MG PO TABS
10.0000 mg | ORAL_TABLET | Freq: Three times a day (TID) | ORAL | Status: DC
  Administered 2018-01-04 – 2018-01-25 (×53): 10 mg via ORAL
  Filled 2018-01-04 (×56): qty 1

## 2018-01-04 MED ORDER — SODIUM CHLORIDE 0.9 % IV SOLN
INTRAVENOUS | Status: DC
  Administered 2018-01-04: 15.2 [IU]/h via INTRAVENOUS
  Filled 2018-01-04 (×2): qty 1

## 2018-01-04 MED ORDER — HEPARIN (PORCINE) IN NACL 100-0.45 UNIT/ML-% IJ SOLN
800.0000 [IU]/h | INTRAMUSCULAR | Status: DC
  Administered 2018-01-04: 800 [IU]/h via INTRAVENOUS
  Filled 2018-01-04: qty 250

## 2018-01-04 MED ORDER — SODIUM CHLORIDE 0.9 % IV BOLUS
1000.0000 mL | Freq: Once | INTRAVENOUS | Status: AC
  Administered 2018-01-04: 1000 mL via INTRAVENOUS

## 2018-01-04 MED ORDER — ONDANSETRON HCL 4 MG/2ML IJ SOLN
4.0000 mg | Freq: Once | INTRAMUSCULAR | Status: AC
  Administered 2018-01-04: 4 mg via INTRAVENOUS
  Filled 2018-01-04: qty 2

## 2018-01-04 MED ORDER — ACETAMINOPHEN 650 MG RE SUPP: 650.0000 mg | Freq: Four times a day (QID) | RECTAL | Status: DC | PRN

## 2018-01-04 MED ORDER — INSULIN REGULAR HUMAN 100 UNIT/ML IJ SOLN
INTRAMUSCULAR | Status: DC
  Administered 2018-01-04: 5.4 [IU]/h via INTRAVENOUS
  Filled 2018-01-04: qty 1

## 2018-01-04 MED ORDER — SODIUM CHLORIDE 0.9 % IV SOLN
INTRAVENOUS | Status: DC
  Administered 2018-01-04 – 2018-01-05 (×2): via INTRAVENOUS

## 2018-01-04 MED ORDER — SODIUM CHLORIDE 0.9 % IV SOLN
INTRAVENOUS | Status: DC
  Administered 2018-01-04: 12:00:00 via INTRAVENOUS

## 2018-01-04 MED ORDER — HEPARIN SODIUM (PORCINE) 5000 UNIT/ML IJ SOLN: 5000.0000 [IU] | Freq: Three times a day (TID) | INTRAMUSCULAR | Status: DC

## 2018-01-04 MED ORDER — LORAZEPAM 2 MG/ML IJ SOLN
0.5000 mg | Freq: Once | INTRAMUSCULAR | Status: AC
  Administered 2018-01-04: 0.5 mg via INTRAVENOUS
  Filled 2018-01-04: qty 1

## 2018-01-04 MED ORDER — PYRIDOSTIGMINE BROMIDE 60 MG PO TABS
30.0000 mg | ORAL_TABLET | Freq: Three times a day (TID) | ORAL | Status: DC
  Administered 2018-01-05 (×2): 30 mg via ORAL
  Filled 2018-01-04 (×6): qty 0.5

## 2018-01-04 MED ORDER — DEXTROSE-NACL 5-0.45 % IV SOLN: INTRAVENOUS | Status: DC

## 2018-01-04 MED ORDER — ACETAMINOPHEN 325 MG PO TABS
650.0000 mg | ORAL_TABLET | Freq: Four times a day (QID) | ORAL | Status: DC | PRN
  Administered 2018-01-04: 650 mg via ORAL
  Filled 2018-01-04: qty 2

## 2018-01-04 MED ORDER — FLUTICASONE PROPIONATE 50 MCG/ACT NA SUSP
2.0000 | Freq: Every day | NASAL | Status: DC
  Administered 2018-01-24: 2 via NASAL
  Filled 2018-01-04: qty 16

## 2018-01-04 MED ORDER — HEPARIN BOLUS VIA INFUSION
3500.0000 [IU] | Freq: Once | INTRAVENOUS | Status: AC
  Administered 2018-01-04: 3500 [IU] via INTRAVENOUS
  Filled 2018-01-04: qty 3500

## 2018-01-04 MED ORDER — LORAZEPAM 2 MG/ML IJ SOLN: 0.5000 mg | Freq: Once | INTRAMUSCULAR | Status: DC

## 2018-01-04 NOTE — ED Notes (Signed)
RN asked patient what was preventing patient from adhering to medication regiment, pt stated that she does not stay compliant with her diabetes medication.

## 2018-01-04 NOTE — ED Notes (Signed)
I gave critical I stat Chem 8 result to MD. Ashok Cordia

## 2018-01-04 NOTE — ED Notes (Signed)
I gave critical I Stat troponin result to MD Ashok Cordia

## 2018-01-04 NOTE — ED Notes (Signed)
ED TO INPATIENT HANDOFF REPORT  Name/Age/Gender [name] 52 y.o. female  Code Status Code Status History    Date Active Date Inactive Code Status Order ID Comments User Context   12/23/2017 1753 12/26/2017 1605 Full Code [id number]  Damita Lack, MD ED   12/10/2017 1835 12/15/2017 1708 Full Code [id number]  Georgette Shell, MD ED   12/10/2017 1819 12/10/2017 1835 Full Code [id number]  Georgette Shell, MD ED   11/22/2017 1934 11/25/2017 2244 Full Code [id number]  Rise Patience, MD ED   11/05/2017 1835 11/11/2017 2134 Full Code [id number]  Hosie Poisson, MD Inpatient   07/10/2017 0751 07/12/2017 2045 Full Code [id number]  Roxan Hockey, MD Inpatient   06/24/2017 0139 06/25/2017 1749 Full Code [id number]  Etta Quill, DO ED   06/16/2017 0324 06/17/2017 1738 Full Code [id number]  Elwyn Reach, MD Inpatient   06/11/2017 0324 06/12/2017 2030 Full Code [id number]  Bonnielee Haff, MD ED   05/16/2017 1756 05/18/2017 1832 Full Code [id number]  Geradine Girt, DO Inpatient   05/06/2017 1130 05/09/2017 2049 Full Code [id number]  Donne Hazel, MD ED   03/25/2017 0742 04/04/2017 0144 Full Code [id number]  Kandice Hams, MD Inpatient   03/25/2017 0653 03/25/2017 0742 Full Code [id number]  Kandice Hams, MD Inpatient   03/16/2017 0606 03/20/2017 2040 Full Code [id number]  Jani Gravel, MD Inpatient   03/05/2017 1836 03/09/2017 1626 Full Code [id number]  Bonnielee Haff, MD Inpatient   02/05/2017 1118 02/06/2017 1706 Full Code [id number]  Merton Border, MD Inpatient   01/31/2017 2122 02/03/2017 1847 Full Code [id number]  Vashti Hey, MD Inpatient   09/27/2016 1833 09/29/2016 2030 Full Code [id number]  Doreatha Lew, MD Inpatient   04/20/2016 1809 04/23/2016 1937 Full Code [id number]  Hosie Poisson, MD Inpatient   04/06/2016 1350 04/09/2016 1829 Full Code [id number]  Elgergawy, Silver Huguenin, MD ED   03/23/2016 0122 03/26/2016 2101 Full Code [id number]  Vianne Bulls, MD ED   03/18/2016  1559 03/20/2016 1741 Full Code [id number]  Mariel Aloe, MD Inpatient   02/19/2016 1416 02/21/2016 1732 Full Code [id number]  Caren Griffins, MD ED   01/24/2016 0701 01/26/2016 2001 Full Code [id number]  Karmen Bongo, MD Inpatient   01/19/2016 1423 01/21/2016 1738 Full Code [id number]  Thurnell Lose, MD ED   11/27/2015 2201 12/01/2015 1839 Full Code [id number]  Toy Baker, MD Inpatient   11/14/2015 0405 11/16/2015 1931 Full Code [id number]  Ivor Costa, MD ED   09/02/2015 1233 09/05/2015 2212 Full Code [id number]  Kelvin Cellar, MD ED   07/29/2015 0755 08/01/2015 2303 Full Code [id number]  Reyne Dumas, MD ED   06/29/2015 2027 07/03/2015 1758 Full Code [id number]  Robbie Lis, MD Inpatient   06/22/2015 1058 06/25/2015 1847 Full Code [id number]  Radene Gunning, NP Inpatient   06/14/2015 1829 06/20/2015 2140 Full Code [id number]  Domenic Polite, MD Inpatient   05/26/2015 2202 06/05/2015 1929 Full Code [id number]  Reubin Milan, MD Inpatient   05/23/2015 0717 05/24/2015 1819 Full Code [id number]  Norval Morton, MD Inpatient   05/17/2015 1221 05/20/2015 1813 Full Code [id number]  Eugenie Filler, MD Inpatient   05/17/2015 1109 05/17/2015 1221 Full Code [id number]  Eugenie Filler, MD ED   04/19/2015 0238 04/20/2015 2229 Full Code [id number]  Rise Patience, MD Inpatient   04/05/2015 0543 04/09/2015 1925 Full Code [id number]  Rise Patience, MD Inpatient   04/05/2015 0537 04/05/2015 0543 Full Code [id number]  Despina Hick, RN Inpatient   03/09/2015 0742 03/12/2015 1911 Full Code [id number]  Theodis Blaze, MD Inpatient   02/20/2015 0108 02/22/2015 1908 Full Code [id number]  Toy Baker, MD Inpatient   02/08/2015 2343 02/13/2015 1532 Full Code [id number]  Lavina Hamman, MD ED   10/21/2014 0057 10/23/2014 1514 Full Code [id number]  Rise Patience, MD Inpatient   02/12/2013 2316 02/17/2013 2122 Full Code [id number]  Quintella Baton, MD Inpatient   11/25/2012 1348 11/28/2012 1904 Full Code  [id number]  Elmarie Shiley, MD ED   11/05/2012 1737 11/07/2012 1620 Full Code [id number]  Sheila Oats, MD Inpatient   04/03/2012 0227 04/05/2012 1643 Full Code [id number]  Quintella Baton, MD Inpatient   03/24/2012 0021 03/28/2012 1751 Full Code [id number]  Earline Mayotte II, RN ED      Home/SNF/Other Home  Chief Complaint hyperglycemic  Level of Care/Admitting Diagnosis ED Disposition    ED Disposition Condition [facility] Area: [facility] [100102]  Level of Care: Stepdown [14]  Admit to SDU based on following criteria: Hemodynamic compromise or significant risk of instability:  Patient requiring short term acute titration and management of vasoactive drips, and invasive monitoring (i.e., CVP and Arterial line).  Admit to SDU based on following criteria: Severe physiological/psychological symptoms:  Any diagnosis requiring assessment & intervention at least every 4 hours on an ongoing basis to obtain desired patient outcomes including stability and rehabilitation  Diagnosis: DKA (diabetic ketoacidoses) [facility]) [250037]  Admitting Physician: Cristal Ford [phone]  Attending Physician: Cristal Ford [phone]  Estimated length of stay: past midnight tomorrow  Certification:: I certify this patient will need inpatient services for at least 2 midnights  PT Class (Do Not Modify): Inpatient [101]  PT Acc Code (Do Not Modify): Private [1]       Medical History Past Medical History:  Diagnosis Date  . Depression   . Diabetes mellitus without complication ([place])   . Essential hypertension   . Gastroparesis   . GERD (gastroesophageal reflux disease)   . HLD (hyperlipidemia)     Allergies Allergies  Allergen Reactions  . Anesthetics, Amide Nausea And Vomiting  . Penicillins Diarrhea, Nausea And Vomiting and Other (See Comments)    Has patient had a PCN reaction causing immediate rash, facial/tongue/throat swelling, SOB or  lightheadedness with hypotension: Yes Has patient had a PCN reaction causing severe rash involving mucus membranes or skin necrosis: No Has patient had a PCN reaction that required hospitalization No Has patient had a PCN reaction occurring within the last 10 years: Yes  If all of the above answers are "NO", then may proceed with Cephalosporin use.   . Buprenorphine Hcl Rash  . Encainide Nausea And Vomiting  . Metoclopramide Other (See Comments)    Dystonia, muscle rigidity.     IV Location/Drains/Wounds Patient Lines/Drains/Airways Status   Active Line/Drains/Airways    Name:   Placement date:   Placement time:   Site:   Days:   Peripheral IV 01/04/18 Right Forearm   01/04/18    1151    Forearm   less than 1          Labs/Imaging Results for orders placed or performed during the hospital encounter of 01/04/18 (from the past 48 hour(s))  CBG monitoring, ED     Status: Abnormal   Collection Time: 01/04/18 10:52 AM  Result Value Ref Range   Glucose-Capillary >600 (HH) 70 - 99 mg/dL   Comment 1 Notify  RN    Comment 2 Document in Chart   Basic metabolic panel     Status: Abnormal   Collection Time: 01/04/18 11:19 AM  Result Value Ref Range   Sodium 138 135 - 145 mmol/L   Potassium 5.2 (H) 3.5 - 5.1 mmol/L   Chloride 97 (L) 98 - 111 mmol/L    Comment: Please note change in reference range.   CO2 8 (L) 22 - 32 mmol/L   Glucose, Bld 892 (HH) 70 - 99 mg/dL    Comment: Please note change in reference range. CRITICAL RESULT CALLED TO, READ BACK BY AND VERIFIED WITH: P.DOWD RN 01/04/2018 1213 JR    BUN 28 (H) 6 - 20 mg/dL    Comment: Please note change in reference range.   Creatinine, Ser 2.37 (H) 0.44 - 1.00 mg/dL   Calcium 8.8 (L) 8.9 - 10.3 mg/dL   GFR calc non Af Amer 23 (L) >60 mL/min   GFR calc Af Amer 26 (L) >60 mL/min    Comment: (NOTE) The eGFR has been calculated using the CKD EPI equation. This calculation has not been validated in all clinical situations. eGFR's  persistently <60 mL/min signify possible Chronic Kidney Disease.    Anion gap 33 (H) 5 - 15    Comment: Performed at [facility], [place] [address] [place], [place] [address]  CBC     Status: Abnormal   Collection Time: 01/04/18 11:19 AM  Result Value Ref Range   WBC 23.2 (H) 4.0 - 10.5 K/uL   RBC 3.66 (L) 3.87 - 5.11 MIL/uL   Hemoglobin 10.2 (L) 12.0 - 15.0 g/dL   HCT 34.7 (L) 36.0 - 46.0 %   MCV 94.8 78.0 - 100.0 fL   MCH 27.9 26.0 - 34.0 pg   MCHC 29.4 (L) 30.0 - 36.0 g/dL   RDW 18.0 (H) 11.5 - 15.5 %   Platelets 566 (H) 150 - 400 K/uL    Comment: Performed at [facility], [place] [address] [place], [place] [address]  Blood gas, venous     Status: Abnormal   Collection Time: 01/04/18 11:19 AM  Result Value Ref Range   pH, Ven 7.082 (LL) 7.250 - 7.430    Comment: RBV ABIGAIL HARRIS,PA AT 1136 BY T.BURGESS,RRT,RCP ON 01/04/2018    pCO2, Ven 26.1 (L) 44.0 - 60.0 mmHg   pO2, Ven 52.5 (H) 32.0 - 45.0 mmHg   Bicarbonate 7.4 (L) 20.0 - 28.0 mmol/L   Acid-base deficit 21.2 (H) 0.0 - 2.0 mmol/L   O2 Saturation 68.8 %   Patient temperature 98.6    Collection site VEIN    Drawn by COLLECTED BY NURSE    Sample type VENOUS     Comment: Performed at [facility], [place] [address] [place], [place] [address]  Ethanol     Status: None   Collection Time: 01/04/18 11:19 AM  Result Value Ref Range   Alcohol, Ethyl (B) <10 <10 mg/dL    Comment: (NOTE) Lowest detectable limit for serum alcohol is 10 mg/dL. For medical purposes only. Performed at [facility], [place] [address], [place], [place] [address]   I-Stat beta hCG blood, ED     Status: None   Collection Time: 01/04/18 11:25 AM  Result Value Ref Range   I-stat hCG, quantitative <5.0 <5 mIU/mL   Comment 3            Comment:   GEST. AGE  CONC.  (mIU/mL)   <=1 WEEK        5 - 50     2 WEEKS       50 - 500     3 WEEKS       100 - 10,000     4 WEEKS      1,000 - 30,000        FEMALE AND NON-PREGNANT FEMALE:     LESS THAN 5 mIU/mL   I-Stat CG4 Lactic Acid, ED     Status: Abnormal   Collection Time: 01/04/18 11:27 AM  Result Value Ref Range   Lactic Acid, Venous 6.42 (HH) 0.5 - 1.9 mmol/L   Comment NOTIFIED PHYSICIAN   I-stat troponin, ED     Status: Abnormal   Collection Time: 01/04/18 11:31 AM  Result Value Ref Range   Troponin i, poc 0.15 (HH) 0.00 - 0.08 ng/mL   Comment NOTIFIED PHYSICIAN    Comment 3            Comment: Due to the release kinetics of cTnI, a negative result within the first hours of the onset of symptoms does not rule out myocardial infarction with certainty. If myocardial infarction is still suspected, repeat the test at appropriate intervals.   I-Stat Chem 8, ED     Status: Abnormal   Collection Time: 01/04/18 11:35 AM  Result Value Ref Range   Sodium 135 135 - 145 mmol/L   Potassium 5.0 3.5 - 5.1 mmol/L   Chloride 103 98 - 111 mmol/L   BUN 28 (H) 6 - 20 mg/dL   Creatinine, Ser 1.70 (H) 0.44 - 1.00 mg/dL   Glucose, Bld >700 (HH) 70 - 99 mg/dL   Calcium, Ion 1.13 (L) 1.15 - 1.40 mmol/L   TCO2 10 (L) 22 - 32 mmol/L   Hemoglobin 11.6 (L) 12.0 - 15.0 g/dL   HCT 34.0 (L) 36.0 - 46.0 %   Comment NOTIFIED PHYSICIAN   CBG monitoring, ED     Status: Abnormal   Collection Time: 01/04/18 11:44 AM  Result Value Ref Range   Glucose-Capillary >600 (HH) 70 - 99 mg/dL  CBG monitoring, ED     Status: Abnormal   Collection Time: 01/04/18 12:46 PM  Result Value Ref Range   Glucose-Capillary >600 (HH) 70 - 99 mg/dL  CBG monitoring, ED     Status: Abnormal   Collection Time: 01/04/18  1:05 PM  Result Value Ref Range   Glucose-Capillary >600 (HH) 70 - 99 mg/dL  CBG monitoring, ED     Status: Abnormal   Collection Time: 01/04/18  1:50 PM  Result Value Ref Range   Glucose-Capillary >600 (HH) 70 - 99 mg/dL   Dg Chest Portable 1 View  Result Date: 01/04/2018 CLINICAL DATA:  History of diabetes.  Tachypnea. EXAM:  PORTABLE CHEST 1 VIEW COMPARISON:  December 23, 2017 FINDINGS: The heart size and mediastinal contours are within normal limits. Both lungs are clear. The visualized skeletal structures are unremarkable. IMPRESSION: No active disease. Electronically Signed   By: Dorise Bullion III M.D   On: 01/04/2018 11:39    Pending Labs Unresulted Labs (From admission, onward)   Start     Ordered   01/04/18 3094  Basic metabolic panel  STAT Now then every 4 hours ,   STAT     01/04/18 1308   01/04/18 1108  Rapid urine drug screen (hospital performed)  STAT,   STAT     01/04/18 1109   01/04/18  1056  Urinalysis, Routine w reflex microscopic  Once,   STAT     01/04/18 1057   Signed and Held  CBC  (heparin)  Once,   R    Comments:  Baseline for heparin therapy IF NOT ALREADY DRAWN.  Notify MD if PLT < 100 K.    Signed and Held   Signed and Held  Creatinine, serum  (heparin)  Once,   R    Comments:  Baseline for heparin therapy IF NOT ALREADY DRAWN.    Signed and Held   Signed and Held  Basic metabolic panel  Tomorrow morning,   R     Signed and Held   Signed and Held  CBC  Tomorrow morning,   R     Signed and Held   Signed and Held  Troponin I (q 6hr x 3)  Now then every 6 hours,   R     Signed and Held      Vitals/Pain Today's Vitals   01/04/18 1300 01/04/18 1400 01/04/18 1413 01/04/18 1413  BP: (!) 89/42  (!) 99/47 (!) 99/47  Pulse: (!) 113 (!) 114 (!) 111 (!) 110  Resp: (!) 38 (!) 38 (!) 35 (!) 27  Temp:    98.7 F (37.1 C)  TempSrc:      SpO2: 100% 100% 100% 100%  PainSc:        Isolation Precautions No active isolations  Medications Medications  dextrose 5 %-0.45 % sodium chloride infusion ( Intravenous Hold 01/04/18 1145)  insulin regular (NOVOLIN R,HUMULIN R) 100 Units in sodium chloride 0.9 % 100 mL (1 Units/mL) infusion (16.2 Units/hr Intravenous Rate/Dose Change 01/04/18 1357)  sodium chloride 0.9 % bolus 1,000 mL (0 mLs Intravenous Stopped 01/04/18 1254)    And  sodium chloride  0.9 % bolus 1,000 mL (0 mLs Intravenous Stopped 01/04/18 1254)    And  0.9 %  sodium chloride infusion ( Intravenous Stopped 01/04/18 1150)  0.9 %  sodium chloride infusion ( Intravenous New Bag/Given 01/04/18 1318)  0.9 %  sodium chloride infusion ( Intravenous New Bag/Given 01/04/18 1318)  ondansetron (ZOFRAN) injection 4 mg (4 mg Intravenous Given 01/04/18 1246)  LORazepam (ATIVAN) injection 0.5 mg (0.5 mg Intravenous Given 01/04/18 1240)    Mobility Walks with assistance

## 2018-01-04 NOTE — ED Provider Notes (Signed)
[facility] DEPT Provider Note   CSN: [id number] Arrival date & time: 01/04/18  1039     History   Chief Complaint Chief Complaint  Patient presents with  . Hyperglycemia    HPI [name] is a 52 y.o. female.  With a past medical history of type 1 diabetes, medical noncompliance, gastroparesis and tardive dyskinesia who presents the emergency department with chief complaint of abdominal pain nausea and vomiting.  The patient is very tachypneic and having difficulty expressing herself.  She states that yesterday she began having abdominal pain and has had intractable vomiting consistent with her history of gastroparesis.  She took her Lantus but has not taken any NovoLog.  She has been unable to hold food or fluids.  She denies fever or chills she denies chest pain or shortness of breath.  She denies burning with urination, bloody urine or other abnormal urinary symptoms.  HPI  Past Medical History:  Diagnosis Date  . Depression   . Diabetes mellitus without complication ([place])   . Essential hypertension   . Gastroparesis   . GERD (gastroesophageal reflux disease)   . HLD (hyperlipidemia)     Patient Active Problem List   Diagnosis Date Noted  . DKA, type 1, not at goal [facility]) 12/23/2017  . Dehydration   . ARF (acute renal failure) ([place])   . Hyperkalemia 11/05/2017  . Constipation 08/01/2017  . MDD (major depressive disorder), single episode, severe , no psychosis ([place])   . DKA (diabetic ketoacidoses) ([place]) 06/16/2017  . Acute dystonic reaction due to drugs 04/01/2017  . Serotonin syndrome 03/25/2017  . Tardive dyskinesia   . Tachycardia 03/16/2017  . Diarrhea 03/05/2017  . DKA, type 1 ([place]) 03/18/2016  . Noncompliance with treatment plan 03/13/2016  . Essential hypertension 01/24/2016  . Leukocytosis 01/24/2016  . Abnormal ECG 11/27/2015  . HLD (hyperlipidemia)   . GERD (gastroesophageal reflux disease)   . AKI (acute kidney injury)  ([place])   . Anemia, iron deficiency   . Vitamin B12 deficiency 08/16/2015  . Diabetic gastroparesis ([place])   . Diabetic neuropathy, type I diabetes mellitus ([place]) 05/18/2015  . Anxiety and depression 05/18/2015  . Nausea & vomiting     Past Surgical History:  Procedure Laterality Date  . COLONOSCOPY  09/27/2014   at [facility]  . ESOPHAGOGASTRODUODENOSCOPY  09/27/2014   at [facility], Dr Rolan Lipa. biospy neg for celiac, neg for H pylori.   . EYE SURGERY    . gailstones    . IR FLUORO GUIDE CV LINE RIGHT  02/01/2017  . IR FLUORO GUIDE CV LINE RIGHT  03/06/2017  . IR FLUORO GUIDE CV LINE RIGHT  03/25/2017  . IR GENERIC HISTORICAL  01/24/2016   IR FLUORO GUIDE CV LINE RIGHT 01/24/2016 Darrell K Allred, PA-C [facility]-INTERV RAD  . IR GENERIC HISTORICAL  01/24/2016   IR US GUIDE VASC ACCESS RIGHT 01/24/2016 Darrell K Allred, PA-C [facility]-INTERV RAD  . IR US GUIDE VASC ACCESS RIGHT  02/01/2017  . IR US GUIDE VASC ACCESS RIGHT  03/06/2017  . IR US GUIDE VASC ACCESS RIGHT  03/25/2017  . POSTERIOR VITRECTOMY AND MEMBRANE PEEL-LEFT EYE  09/28/2002  . POSTERIOR VITRECTOMY AND MEMBRANE PEEL-RIGHT EYE  03/16/2002  . RETINAL DETACHMENT SURGERY       OB History   None      Home Medications    Prior to Admission medications   Medication Sig Start Date End Date Taking? Authorizing Provider  bisacodyl (DULCOLAX) 5 MG EC  tablet Take 5 mg by mouth daily as needed for moderate constipation.    [provider]  busPIRone (BUSPAR) 10 MG tablet Take 1 tablet (10 mg total) by mouth 3 (three) times daily. 11/28/17   [name], MD  fluticasone (FLONASE) 50 MCG/ACT nasal spray Place 2 sprays into both nostrils daily. 11/12/17   Eugenie Filler, MD  furosemide (LASIX) 20 MG tablet Take 1 tablet (20 mg total) by mouth daily as needed for fluid or edema. 11/28/17   [name], MD  insulin aspart (NOVOLOG) 100 UNIT/ML injection Inject 0-12 Units into the skin 4 (four) times daily -  before meals and at bedtime. Uses a  sliding scale: 70-119 = 0 units, 120-150 = 2 units, 151-180 =  4 units, 181-210 = 5 units, 211-240 = 6 units, 241-270 = 8 units, 271-300 = 9 units, 301-330 = 10 units, 331-360 = 11 units, 361-400 = 12 units, greater than 400 notify MD.    [provider]  insulin detemir (LEVEMIR) 100 UNIT/ML injection Inject 0.15 mLs (15 Units total) into the skin every morning. 11/28/17   [name], MD  mirtazapine (REMERON) 15 MG tablet Take 1 tablet (15 mg total) by mouth at bedtime. 11/28/17   [name], MD  ondansetron (ZOFRAN ODT) 4 MG disintegrating tablet Take 1 tablet (4 mg total) by mouth every 8 (eight) hours as needed for nausea or vomiting. 12/20/17   Varney Biles, MD  pantoprazole (PROTONIX) 40 MG tablet Take 1 tablet (40 mg total) by mouth daily. 11/28/17   [name], MD  polyethylene glycol (MIRALAX / GLYCOLAX) packet Take 17 g by mouth daily as needed for mild constipation.  04/11/17   [provider]  pregabalin (LYRICA) 150 MG capsule Take 1 capsule (150 mg total) by mouth daily. 10/16/17   [name], MD  pyridostigmine (MESTINON) 60 MG tablet Take 0.5 tablets (30 mg total) by mouth 3 (three) times daily. Take 1/2 with meals 11/11/17   Eugenie Filler, MD  Syringe/Needle, Disp, (SYRINGE 3CC/22GX1-1/2") 22G X 1-1/2" 3 ML MISC Inject 1,000 mcg into the muscle daily. 11/11/17   Eugenie Filler, MD    Family History Family History  Problem Relation Age of Onset  . Cystic fibrosis Mother   . Hypertension Father   . Diabetes Brother   . Hypertension Maternal Grandmother     Social History Social History   Tobacco Use  . Smoking status: Never Smoker  . Smokeless tobacco: Never Used  Substance Use Topics  . Alcohol use: No  . Drug use: No     Allergies   Anesthetics, amide; Penicillins; Buprenorphine hcl; Encainide; and Metoclopramide   Review of Systems Review of Systems Ten systems reviewed and are negative for acute change, except as noted  in the HPI.    Physical Exam Updated Vital Signs BP (!) 110/42 (BP Location: Left Arm)   Pulse (!) 112   Temp 98.1 F (36.7 C) (Oral)   Resp 15   LMP  (LMP Unknown)   SpO2 100%   Physical Exam  Constitutional: She is oriented to person, place, and time. She appears well-developed. She appears ill. No distress.  HENT:  Head: Normocephalic and atraumatic.  Dry mucous membranes  Eyes: Conjunctivae are normal. No scleral icterus.  Neck: Normal range of motion.  Cardiovascular: Normal rate, regular rhythm and normal heart sounds. Exam reveals no gallop and no friction rub.  No murmur heard. Pulmonary/Chest: Effort normal and breath sounds normal. Tachypnea  noted. No respiratory distress.  Abdominal: Soft. Bowel sounds are normal. She exhibits no distension and no mass. There is tenderness. There is no guarding.  Diffuse tenderness  Neurological: She is alert and oriented to person, place, and time.  Skin: Skin is warm and dry. She is not diaphoretic.  Psychiatric: Her behavior is normal.  Nursing note and vitals reviewed.    ED Treatments / Results  Labs (all labs ordered are listed, but only abnormal results are displayed) Labs Reviewed  CBG MONITORING, ED - Abnormal; Notable for the following components:      Result Value   Glucose-Capillary >600 (*)    All other components within normal limits  BASIC METABOLIC PANEL  CBC  URINALYSIS, ROUTINE W REFLEX MICROSCOPIC  BLOOD GAS, VENOUS  RAPID URINE DRUG SCREEN, HOSP PERFORMED  ETHANOL  CBG MONITORING, ED  I-STAT BETA HCG BLOOD, ED ([facility], [facility], [facility] ONLY)  I-STAT CHEM 8, ED  I-STAT CHEM 8, ED  I-STAT CG4 LACTIC ACID, ED  I-STAT TROPONIN, ED    EKG EKG Interpretation  Date/Time:  Saturday January 04 2018 11:01:59 EDT Ventricular Rate:  110 PR Interval:    QRS Duration: 99 QT Interval:  365 QTC Calculation: 494 R Axis:   -34 Text Interpretation:  Sinus tachycardia Multiple ventricular premature complexes Left axis  deviation ST depression in II, III, aVF, V2-6 Confirmed by Quintella Reichert [phone]) on 01/04/2018 11:12:38 AM   Radiology No results found.  Procedures .Critical Care Performed by: Margarita Mail, PA-C Authorized by: Margarita Mail, PA-C   Critical care provider statement:    Critical care time (minutes):  50   Critical care was necessary to treat or prevent imminent or life-threatening deterioration of the following conditions:  Metabolic crisis and endocrine crisis   Critical care was time spent personally by me on the following activities:  Review of old charts, re-evaluation of patient's condition, pulse oximetry, ordering and review of radiographic studies, ordering and review of laboratory studies, ordering and performing treatments and interventions, development of treatment plan with patient or surrogate, discussions with consultants, examination of patient, interpretation of cardiac output measurements and evaluation of patient's response to treatment   (including critical care time)  Medications Ordered in ED Medications  dextrose 5 %-0.45 % sodium chloride infusion (has no administration in time range)  insulin regular (NOVOLIN R,HUMULIN R) 100 Units in sodium chloride 0.9 % 100 mL (1 Units/mL) infusion (has no administration in time range)  sodium chloride 0.9 % bolus 1,000 mL (has no administration in time range)    And  sodium chloride 0.9 % bolus 1,000 mL (has no administration in time range)    And  0.9 %  sodium chloride infusion (has no administration in time range)  ondansetron (ZOFRAN) injection 4 mg (has no administration in time range)     Initial Impression / Assessment and Plan / ED Course  I have reviewed the triage vital signs and the nursing notes.  Pertinent labs & imaging results that were available during my care of the patient were reviewed by me and considered in my medical decision making (see chart for details).  Clinical Course as of Jan 05 1415    Sat Jan 04, 2018  1407 Troponin elevated, ST seg depression diffusely however this was present on EKG in June and this likely represents demand ischemia. I   Troponin i, poc(!!): 0.15 [AH]  1408 Glucose(!!): 892 [AH]  1409 WBC(!): 23.2 [AH]  1409 Patient is markedly acidotic., [place]  breathing. On glucose stabilizer.  pH, Ven(!!): 7.082 [AH]  25 Cxr is negative.   [AH]    Clinical Course User Index [AH] Margarita Mail, PA-C    Patient with DKA.  This is a frequent recurrence for this patient.  She is a history of noncompliance with her medications.  Patient troponin elevated likely secondary to demand ischemia.  I have begun her on significant fluid resuscitation, insulin drip and patient will be placed on the stepdown unit.  Final Clinical Impressions(s) / ED Diagnoses   Final diagnoses:  Diabetic ketoacidosis without coma associated with type 1 diabetes mellitus [facility])    ED Discharge Orders    None       Margarita Mail, PA-C 01/04/18 1418    Lajean Saver, MD 01/05/18 [phone]

## 2018-01-04 NOTE — Progress Notes (Signed)
ANTICOAGULATION CONSULT NOTE - Initial Consult  Pharmacy Consult for IV heparin Indication: chest pain/ACS  Allergies  Allergen Reactions  . Anesthetics, Amide Nausea And Vomiting  . Penicillins Diarrhea, Nausea And Vomiting and Other (See Comments)    Has patient had a PCN reaction causing immediate rash, facial/tongue/throat swelling, SOB or lightheadedness with hypotension: Yes Has patient had a PCN reaction causing severe rash involving mucus membranes or skin necrosis: No Has patient had a PCN reaction that required hospitalization No Has patient had a PCN reaction occurring within the last 10 years: Yes  If all of the above answers are "NO", then may proceed with Cephalosporin use.   . Buprenorphine Hcl Rash  . Encainide Nausea And Vomiting  . Metoclopramide Other (See Comments)    Dystonia, muscle rigidity.     Patient Measurements:   Heparin Dosing Weight: 68 kg   Vital Signs: Temp: 97.3 F (36.3 C) (07/13 1600) Temp Source: Oral (07/13 1600) BP: 108/42 (07/13 1830) Pulse Rate: 106 (07/13 1830)  Labs: Recent Labs    01/04/18 1119 01/04/18 1135 01/04/18 1544 01/04/18 1651 01/04/18 1825  HGB 10.2* 11.6*  --   --   --   HCT 34.7* 34.0*  --   --   --   PLT 566*  --   --   --   --   APTT  --   --   --   --  56*  LABPROT  --   --   --   --  15.8*  INR  --   --   --   --  1.27  CREATININE 2.37* 1.70*  --  2.17*  --   TROPONINI  --   --  2.37*  --   --     Estimated Creatinine Clearance: 27.4 mL/min (A) (by C-G formula based on SCr of 2.17 mg/dL (H)).   Medical History: Past Medical History:  Diagnosis Date  . Depression   . Diabetes mellitus without complication ([place])   . Essential hypertension   . Gastroparesis   . GERD (gastroesophageal reflux disease)   . HLD (hyperlipidemia)     Medications:  Scheduled:  . busPIRone  10 mg Oral TID  . fluticasone  2 spray Each Nare Daily  . pyridostigmine  30 mg Oral TID    Assessment: Pharmacy is consulted  to dose heparin in 52 yo female diagnosed with ACS. No listed anticoagulation on PTA med list.  Baseline Labs:   PT 15.8, INR 1.27   Hgb 11.6, hct 34  Scr 2.17, Crcl 27 ml/min   Troponin 2.37  Goal of Therapy:  Heparin level 0.3-0.7 units/ml Monitor platelets by anticoagulation protocol: Yes   Plan:   Heparin 3500 units bolus followed by heparin 800 units/hr  HL 8 hours after med started   Monitor for signs and symptoms of bleeding   Royetta Asal, PharmD, BCPS Pager [phone] 01/04/2018 8:25 PM

## 2018-01-04 NOTE — Progress Notes (Addendum)
Inpatient Diabetes Program Recommendations  AACE/ADA: New Consensus Statement on Inpatient Glycemic Control (2015)  Target Ranges:  Prepandial:   less than 140 mg/dL      Peak postprandial:   less than 180 mg/dL (1-2 hours)      Critically ill patients:  140 - 180 mg/dL   Lab Results  Component Value Date   GLUCAP >600 (HH) 01/04/2018   HGBA1C 8.8 (H) 12/24/2017    Review of Glycemic Control  Diabetes history:  Outpatient Diabetes medications: Levemir 15 units daily, Novolog custom sliding scale 0-12 units four times per day Current orders for Inpatient glycemic control: IV insulin  Inpatient Diabetes Program Recommendations:   Our team is very familiar with patient and has been seen numerous times with past admissions. Patient was last discharged on December 26, 2017.   When anion gap is closed, recommend transtioning off IV insulin to Levemir 15 units daily and  Novolog 0-9  units every 4 hours if NPO and then TID when eating with Novolog 0-5 units HS scale. May need to titrate dosages as needed. Will continue to monitor blood sugars while in the hospital.   Harvel Ricks RN BSN CDE Diabetes Coordinator Pager: [phone]  8am-5pm

## 2018-01-04 NOTE — ED Notes (Signed)
Pt has pure wick in place.  

## 2018-01-04 NOTE — ED Notes (Signed)
I gave critical I Stat CG4 result to MD Ashok Cordia

## 2018-01-04 NOTE — ED Notes (Signed)
No urine noted in pure wick at this time  

## 2018-01-04 NOTE — ED Notes (Signed)
Bed: YZ70 Expected date: 01/04/18 Expected time: 10:36 AM Means of arrival: Ambulance Comments: Hyperglycemia and Hypotension

## 2018-01-04 NOTE — H&P (Signed)
[facility] History and Physical  [name] LNL:[id number] DOB: 11/17/1965 DOA: 01/04/2018  PCP: [name], MD  Patient coming from: Home  Chief Complaint: Abdominal pain  HPI: [name] is a 52 y.o. female with a medical history of diabetes mellitus, medical noncompliance, depression, who presented to the emergency department with complaints of abdominal discomfort and nausea, back pain.  Patient states this recently started 1 day ago.  She was taking her Levemir but could not take her short acting insulin as she was not able to eat.  The time of examination, patient was providing very limited information as she is continuing to Trail and not answer questions.  She states that her back and abdomen hurt and she would like a pillow.  Cannot obtain information regarding review of systems due to lack of cooperation from patient.  ED Course: Found to have DKA, started on insulin.  [facility] called for admission.  Review of Systems:  Cannot obtain due to patient cooperation  Past Medical History:  Diagnosis Date  . Depression   . Diabetes mellitus without complication ([place])   . Essential hypertension   . Gastroparesis   . GERD (gastroesophageal reflux disease)   . HLD (hyperlipidemia)     Past Surgical History:  Procedure Laterality Date  . COLONOSCOPY  09/27/2014   at [facility]  . ESOPHAGOGASTRODUODENOSCOPY  09/27/2014   at [facility], Dr Rolan Lipa. biospy neg for celiac, neg for H pylori.   . EYE SURGERY    . gailstones    . IR FLUORO GUIDE CV LINE RIGHT  02/01/2017  . IR FLUORO GUIDE CV LINE RIGHT  03/06/2017  . IR FLUORO GUIDE CV LINE RIGHT  03/25/2017  . IR GENERIC HISTORICAL  01/24/2016   IR FLUORO GUIDE CV LINE RIGHT 01/24/2016 Darrell K Allred, PA-C [facility]  . IR GENERIC HISTORICAL  01/24/2016   IR US GUIDE VASC ACCESS RIGHT 01/24/2016 Darrell K Allred, PA-C [facility]  . IR US GUIDE VASC ACCESS RIGHT  02/01/2017  . IR US GUIDE VASC ACCESS RIGHT  03/06/2017   . IR US GUIDE VASC ACCESS RIGHT  03/25/2017  . POSTERIOR VITRECTOMY AND MEMBRANE PEEL-LEFT EYE  09/28/2002  . POSTERIOR VITRECTOMY AND MEMBRANE PEEL-RIGHT EYE  03/16/2002  . RETINAL DETACHMENT SURGERY      Social History:  reports that she has never smoked. She has never used smokeless tobacco. She reports that she does not drink alcohol or use drugs.   Allergies  Allergen Reactions  . Anesthetics, Amide Nausea And Vomiting  . Penicillins Diarrhea, Nausea And Vomiting and Other (See Comments)    Has patient had a PCN reaction causing immediate rash, facial/tongue/throat swelling, SOB or lightheadedness with hypotension: Yes Has patient had a PCN reaction causing severe rash involving mucus membranes or skin necrosis: No Has patient had a PCN reaction that required hospitalization No Has patient had a PCN reaction occurring within the last 10 years: Yes  If all of the above answers are "NO", then may proceed with Cephalosporin use.   . Buprenorphine Hcl Rash  . Encainide Nausea And Vomiting  . Metoclopramide Other (See Comments)    Dystonia, muscle rigidity.     Family History  Problem Relation Age of Onset  . Cystic fibrosis Mother   . Hypertension Father   . Diabetes Brother   . Hypertension Maternal Grandmother      Prior to Admission medications   Medication Sig Start Date End Date Taking? Authorizing Provider  bisacodyl (  DULCOLAX) 5 MG EC tablet Take 5 mg by mouth daily as needed for moderate constipation.    [provider]  busPIRone (BUSPAR) 10 MG tablet Take 1 tablet (10 mg total) by mouth 3 (three) times daily. 11/28/17   [name], MD  fluticasone (FLONASE) 50 MCG/ACT nasal spray Place 2 sprays into both nostrils daily. 11/12/17   Eugenie Filler, MD  furosemide (LASIX) 20 MG tablet Take 1 tablet (20 mg total) by mouth daily as needed for fluid or edema. 11/28/17   [name], MD  insulin aspart (NOVOLOG) 100 UNIT/ML injection Inject 0-12 Units into  the skin 4 (four) times daily -  before meals and at bedtime. Uses a sliding scale: 70-119 = 0 units, 120-150 = 2 units, 151-180 =  4 units, 181-210 = 5 units, 211-240 = 6 units, 241-270 = 8 units, 271-300 = 9 units, 301-330 = 10 units, 331-360 = 11 units, 361-400 = 12 units, greater than 400 notify MD.    [provider]  insulin detemir (LEVEMIR) 100 UNIT/ML injection Inject 0.15 mLs (15 Units total) into the skin every morning. 11/28/17   [name], MD  mirtazapine (REMERON) 15 MG tablet Take 1 tablet (15 mg total) by mouth at bedtime. 11/28/17   [name], MD  ondansetron (ZOFRAN ODT) 4 MG disintegrating tablet Take 1 tablet (4 mg total) by mouth every 8 (eight) hours as needed for nausea or vomiting. 12/20/17   Varney Biles, MD  pantoprazole (PROTONIX) 40 MG tablet Take 1 tablet (40 mg total) by mouth daily. 11/28/17   [name], MD  polyethylene glycol (MIRALAX / GLYCOLAX) packet Take 17 g by mouth daily as needed for mild constipation.  04/11/17   [provider]  pregabalin (LYRICA) 150 MG capsule Take 1 capsule (150 mg total) by mouth daily. 10/16/17   [name], MD  pyridostigmine (MESTINON) 60 MG tablet Take 0.5 tablets (30 mg total) by mouth 3 (three) times daily. Take 1/2 with meals 11/11/17   Eugenie Filler, MD  Syringe/Needle, Disp, (SYRINGE 3CC/22GX1-1/2") 22G X 1-1/2" 3 ML MISC Inject 1,000 mcg into the muscle daily. 11/11/17   Eugenie Filler, MD    Physical Exam: Vitals:   01/04/18 1051 01/04/18 1141  BP: (!) 110/42 (!) 102/44  Pulse: (!) 112 (!) 113  Resp: 15 (!) 39  Temp: 98.1 F (36.7 C)   SpO2: 100% (S) 100%   Patient was not very cooperative with exam.    General: Well developed, chronically ill appearing, mild distress  HEENT: NCAT, PERRLA, EOMI, Anicteic Sclera, mucous membranes dry   Neck: Supple, no JVD, no masses  Cardiovascular: S1 S2 auscultated, tachycardic, no murmur appreciated   Respiratory: Clear to  auscultation bilaterally, tachypneic   Abdomen: Soft, nontender, nondistended, + bowel sounds  Extremities: warm dry without cyanosis clubbing or edema  Neuro: awake and alert, cannot assess orientation given patient's lack of cooperation with exam, she is moving all extremities, cranial nerves intact as tested  Skin: Without rashes exudates or nodules  Psych:Anxious  Labs on Admission: I have personally reviewed following labs and imaging studies CBC: Recent Labs  Lab 01/04/18 1119 01/04/18 1135  WBC 23.2*  --   HGB 10.2* 11.6*  HCT 34.7* 34.0*  MCV 94.8  --   PLT 566*  --    Basic Metabolic Panel: Recent Labs  Lab 01/04/18 1119 01/04/18 1135  NA 138 135  K 5.2* 5.0  CL 97* 103  CO2 8*  --  GLUCOSE 892* >700*  BUN 28* 28*  CREATININE 2.37* 1.70*  CALCIUM 8.8*  --    GFR: Estimated Creatinine Clearance: 34.9 mL/min (A) (by C-G formula based on SCr of 1.7 mg/dL (H)). Liver Function Tests: No results for input(s): AST, ALT, ALKPHOS, BILITOT, PROT, ALBUMIN in the last 168 hours. No results for input(s): LIPASE, AMYLASE in the last 168 hours. No results for input(s): AMMONIA in the last 168 hours. Coagulation Profile: No results for input(s): INR, PROTIME in the last 168 hours. Cardiac Enzymes: No results for input(s): CKTOTAL, CKMB, CKMBINDEX, TROPONINI in the last 168 hours. BNP (last 3 results) No results for input(s): PROBNP in the last 8760 hours. HbA1C: No results for input(s): HGBA1C in the last 72 hours. CBG: Recent Labs  Lab 01/04/18 1052 01/04/18 1144 01/04/18 1246  GLUCAP >600* >600* >600*   Lipid Profile: No results for input(s): CHOL, HDL, LDLCALC, TRIG, CHOLHDL, LDLDIRECT in the last 72 hours. Thyroid Function Tests: No results for input(s): TSH, T4TOTAL, FREET4, T3FREE, THYROIDAB in the last 72 hours. Anemia Panel: No results for input(s): VITAMINB12, FOLATE, FERRITIN, TIBC, IRON, RETICCTPCT in the last 72 hours. Urine analysis:      Component Value Date/Time   COLORURINE YELLOW 12/23/2017 1430   APPEARANCEUR CLEAR 12/23/2017 1430   LABSPEC 1.018 12/23/2017 1430   PHURINE 5.0 12/23/2017 1430   GLUCOSEU >=500 (A) 12/23/2017 1430   HGBUR SMALL (A) 12/23/2017 1430   BILIRUBINUR NEGATIVE 12/23/2017 1430   BILIRUBINUR neg 01/17/2017 1533   KETONESUR 80 (A) 12/23/2017 1430   PROTEINUR 30 (A) 12/23/2017 1430   UROBILINOGEN 0.2 01/17/2017 1533   UROBILINOGEN 0.2 04/27/2015 1608   NITRITE NEGATIVE 12/23/2017 1430   LEUKOCYTESUR NEGATIVE 12/23/2017 1430   Sepsis Labs: @LABRCNTIP (procalcitonin:4,lacticidven:4) )No results found for this or any previous visit (from the past 240 hour(s)).   Radiological Exams on Admission: Dg Chest Portable 1 View  Result Date: 01/04/2018 CLINICAL DATA:  History of diabetes.  Tachypnea. EXAM: PORTABLE CHEST 1 VIEW COMPARISON:  December 23, 2017 FINDINGS: The heart size and mediastinal contours are within normal limits. Both lungs are clear. The visualized skeletal structures are unremarkable. IMPRESSION: No active disease. Electronically Signed   By: Dorise Bullion III M.D   On: 01/04/2018 11:39    EKG: Independently reviewed.  sinus tachycardia, rate 110, ST depressions in multiple leads- when compared to older EKG, no changes noted  Assessment/Plan  DKA with a history of insulin dependent diabetes mellitus, type I and noncompliance -Patient presented with blood sugar of 892, anion gap of 33, CO2 of 8 -VBG showed a pH of 7.082, PCO2 26.1 -Will place on insulin/leuko-stabilizer, obtain BMP every 4 hours, placed on IV fluids -Diabetes educator consulted -Will keep n.p.o. for now -Placed on antiemetics -patient has had multiple admissions and has difficult access placement, may need port?  Acute kidney injury on chronic kidney disease, stage III -Likely secondary to DKA and dehydration -Creatinine on admission was 2.37, baseline creatinine 1.3-1.7 -Will place on IV fluids and continue to  monitor BMP  Metabolic acidosis/lactic acidosis -Suspect secondary to the above, will treat underlying cause  Mildly elevated troponin -Troponin 0.15, point-of-care -Will cycle troponin -Suspect demand ischemia to the above -EKG reviewed, showing sinus tachycardia with some ST depressions in multiple leads however this was noted on previous EKGs  Abdominal discomfort with nausea/ gastroparesis  -Likely secondary to DKA and gastroparesis -Will place on antiemetics -will obtain abdominal xray  SIRS -Patient with leukocytosis, tachycardia and tachypnea -CXR unremarkable  -  UA pending -will not start antibiotics at this time  Peripheral neuropathy likely secondary to diabetes -Resume Lyrica when appropriate  Chronic normocytic anemia -Hemoglobin appears to be stable, continue to monitor CBC  Depression and anxiety -Continue BuSpar  DVT prophylaxis: heparin  Code Status: Full  Family Communication: None at bedside. Admission, patients condition and plan of care including tests being ordered have been discussed with the patient, who indicates understanding and agrees with the plan and Code Status.  Disposition Plan: Home   Consults called: None   Admission status: Admitted to stepdown   Time spent: 70 minutes  Cortlyn Cannell D.O. [facility] Pager [phone]  If 7PM-7AM, please contact night-coverage [email or web address] Password [email or web address] 01/04/2018, 1:02 PM

## 2018-01-04 NOTE — ED Notes (Signed)
Pt reminded of need for urine 

## 2018-01-04 NOTE — Progress Notes (Signed)
CRITICAL VALUE ALERT  Critical Value:  Troponin  2.37  Date & Time Notied:  01-04-2018  16:52  Provider Notified: Dr. Ree Kida  Orders Received/Actions taken: placed order for heparin per pharmacy consult and discontinued heparin injection 5,000 units

## 2018-01-05 ENCOUNTER — Encounter (HOSPITAL_COMMUNITY): Payer: Self-pay | Admitting: General Practice

## 2018-01-05 ENCOUNTER — Inpatient Hospital Stay: Payer: Self-pay

## 2018-01-05 ENCOUNTER — Inpatient Hospital Stay (HOSPITAL_COMMUNITY): Payer: Medicare Other

## 2018-01-05 DIAGNOSIS — I214 Non-ST elevation (NSTEMI) myocardial infarction: Principal | ICD-10-CM

## 2018-01-05 DIAGNOSIS — R112 Nausea with vomiting, unspecified: Secondary | ICD-10-CM

## 2018-01-05 DIAGNOSIS — I340 Nonrheumatic mitral (valve) insufficiency: Secondary | ICD-10-CM

## 2018-01-05 LAB — GLUCOSE, CAPILLARY
GLUCOSE-CAPILLARY: 103 mg/dL — AB (ref 70–99)
GLUCOSE-CAPILLARY: 104 mg/dL — AB (ref 70–99)
GLUCOSE-CAPILLARY: 104 mg/dL — AB (ref 70–99)
GLUCOSE-CAPILLARY: 115 mg/dL — AB (ref 70–99)
GLUCOSE-CAPILLARY: 127 mg/dL — AB (ref 70–99)
GLUCOSE-CAPILLARY: 132 mg/dL — AB (ref 70–99)
GLUCOSE-CAPILLARY: 145 mg/dL — AB (ref 70–99)
GLUCOSE-CAPILLARY: 148 mg/dL — AB (ref 70–99)
GLUCOSE-CAPILLARY: 162 mg/dL — AB (ref 70–99)
GLUCOSE-CAPILLARY: 179 mg/dL — AB (ref 70–99)
GLUCOSE-CAPILLARY: 223 mg/dL — AB (ref 70–99)
GLUCOSE-CAPILLARY: 255 mg/dL — AB (ref 70–99)
Glucose-Capillary: 242 mg/dL — ABNORMAL HIGH (ref 70–99)
Glucose-Capillary: 212 mg/dL — ABNORMAL HIGH (ref 70–99)
Glucose-Capillary: 113 mg/dL — ABNORMAL HIGH (ref 70–99)
Glucose-Capillary: 207 mg/dL — ABNORMAL HIGH (ref 70–99)
Glucose-Capillary: 214 mg/dL — ABNORMAL HIGH (ref 70–99)

## 2018-01-05 LAB — BASIC METABOLIC PANEL
Anion gap: 12 (ref 5–15)
Anion gap: 13 (ref 5–15)
Anion gap: 12 (ref 5–15)
BUN: 24 mg/dL — ABNORMAL HIGH (ref 6–20)
BUN: 22 mg/dL — AB (ref 6–20)
BUN: 19 mg/dL (ref 6–20)
CO2: 18 mmol/L — ABNORMAL LOW (ref 22–32)
CO2: 16 mmol/L — AB (ref 22–32)
CO2: 17 mmol/L — ABNORMAL LOW (ref 22–32)
CREATININE: 1.52 mg/dL — AB (ref 0.44–1.00)
CREATININE: 1.44 mg/dL — AB (ref 0.44–1.00)
Calcium: 8.3 mg/dL — ABNORMAL LOW (ref 8.9–10.3)
Calcium: 8.3 mg/dL — ABNORMAL LOW (ref 8.9–10.3)
Calcium: 8.4 mg/dL — ABNORMAL LOW (ref 8.9–10.3)
Chloride: 118 mmol/L — ABNORMAL HIGH (ref 98–111)
Chloride: 117 mmol/L — ABNORMAL HIGH (ref 98–111)
Chloride: 117 mmol/L — ABNORMAL HIGH (ref 98–111)
Creatinine, Ser: 1.67 mg/dL — ABNORMAL HIGH (ref 0.44–1.00)
GFR calc Af Amer: 45 mL/min — ABNORMAL LOW (ref >60)
GFR calc Af Amer: 48 mL/min — ABNORMAL LOW (ref >60)
GFR calc non Af Amer: 35 mL/min — ABNORMAL LOW (ref >60)
GFR calc non Af Amer: 39 mL/min — ABNORMAL LOW (ref >60)
GFR, EST AFRICAN AMERICAN: 40 mL/min — AB (ref >60)
GFR, EST NON AFRICAN AMERICAN: 42 mL/min — AB (ref >60)
GLUCOSE: 177 mg/dL — AB (ref 70–99)
GLUCOSE: 173 mg/dL — AB (ref 70–99)
Glucose, Bld: 135 mg/dL — ABNORMAL HIGH (ref 70–99)
POTASSIUM: 3.5 mmol/L (ref 3.5–5.1)
Potassium: 3.6 mmol/L (ref 3.5–5.1)
Potassium: 4.4 mmol/L (ref 3.5–5.1)
SODIUM: 148 mmol/L — AB (ref 135–145)
SODIUM: 146 mmol/L — AB (ref 135–145)
Sodium: 146 mmol/L — ABNORMAL HIGH (ref 135–145)

## 2018-01-05 LAB — HEPARIN LEVEL (UNFRACTIONATED)
HEPARIN UNFRACTIONATED: 1.45 [IU]/mL — AB (ref 0.30–0.70)
Heparin Unfractionated: 0.31 IU/mL (ref 0.30–0.70)

## 2018-01-05 LAB — CBC
HCT: 29 % — ABNORMAL LOW (ref 36.0–46.0)
Hemoglobin: 9.4 g/dL — ABNORMAL LOW (ref 12.0–15.0)
MCH: 27.3 pg (ref 26.0–34.0)
MCHC: 32.4 g/dL (ref 30.0–36.0)
MCV: 84.3 fL (ref 78.0–100.0)
Platelets: 486 10*3/uL — ABNORMAL HIGH (ref 150–400)
RBC: 3.44 MIL/uL — AB (ref 3.87–5.11)
RDW: 18 % — ABNORMAL HIGH (ref 11.5–15.5)
WBC: 28.7 10*3/uL — ABNORMAL HIGH (ref 4.0–10.5)

## 2018-01-05 LAB — TROPONIN I
TROPONIN I: 40.47 ng/mL — AB (ref <0.03)
Troponin I: 10.24 ng/mL (ref <0.03)

## 2018-01-05 LAB — LACTIC ACID, PLASMA: Lactic Acid, Venous: 2.5 mmol/L (ref 0.5–1.9)

## 2018-01-05 LAB — ECHOCARDIOGRAM COMPLETE

## 2018-01-05 MED ORDER — INSULIN ASPART 100 UNIT/ML ~~LOC~~ SOLN
0.0000 [IU] | Freq: Three times a day (TID) | SUBCUTANEOUS | Status: DC
  Administered 2018-01-06: 7 [IU] via SUBCUTANEOUS
  Administered 2018-01-07: 3 [IU] via SUBCUTANEOUS

## 2018-01-05 MED ORDER — NITROGLYCERIN 0.4 MG SL SUBL
0.4000 mg | SUBLINGUAL_TABLET | Freq: Once | SUBLINGUAL | Status: AC
  Administered 2018-01-05: 0.4 mg via SUBLINGUAL

## 2018-01-05 MED ORDER — LORAZEPAM 2 MG/ML IJ SOLN
0.5000 mg | Freq: Once | INTRAMUSCULAR | Status: AC
  Administered 2018-01-05: 0.5 mg via INTRAVENOUS
  Filled 2018-01-05: qty 1

## 2018-01-05 MED ORDER — NITROGLYCERIN 0.4 MG SL SUBL
SUBLINGUAL_TABLET | SUBLINGUAL | Status: AC
  Filled 2018-01-05: qty 1

## 2018-01-05 MED ORDER — HEPARIN (PORCINE) IN NACL 100-0.45 UNIT/ML-% IJ SOLN
650.0000 [IU]/h | INTRAMUSCULAR | Status: DC
  Administered 2018-01-05 – 2018-01-06 (×2): 650 [IU]/h via INTRAVENOUS
  Filled 2018-01-05: qty 250

## 2018-01-05 MED ORDER — PROMETHAZINE HCL 25 MG/ML IJ SOLN
12.5000 mg | Freq: Four times a day (QID) | INTRAMUSCULAR | Status: DC | PRN
  Administered 2018-01-05 (×2): 12.5 mg via INTRAVENOUS
  Filled 2018-01-05 (×2): qty 1

## 2018-01-05 MED ORDER — INSULIN DETEMIR 100 UNIT/ML ~~LOC~~ SOLN
15.0000 [IU] | Freq: Every day | SUBCUTANEOUS | Status: DC
  Administered 2018-01-05: 15 [IU] via SUBCUTANEOUS
  Filled 2018-01-05 (×2): qty 0.15

## 2018-01-05 NOTE — Progress Notes (Signed)
ANTICOAGULATION CONSULT NOTE -  Consult  Pharmacy Consult for IV heparin Indication: chest pain/ACS  Allergies  Allergen Reactions  . Anesthetics, Amide Nausea And Vomiting  . Penicillins Diarrhea, Nausea And Vomiting and Other (See Comments)    Has patient had a PCN reaction causing immediate rash, facial/tongue/throat swelling, SOB or lightheadedness with hypotension: Yes Has patient had a PCN reaction causing severe rash involving mucus membranes or skin necrosis: No Has patient had a PCN reaction that required hospitalization No Has patient had a PCN reaction occurring within the last 10 years: Yes  If all of the above answers are "NO", then may proceed with Cephalosporin use.   . Buprenorphine Hcl Rash  . Encainide Nausea And Vomiting  . Metoclopramide Other (See Comments)    Dystonia, muscle rigidity.     Patient Measurements:   Heparin Dosing Weight: 68 kg   Vital Signs: Temp: 98.5 F (36.9 C) (07/14 1121) Temp Source: Oral (07/14 1121) BP: 156/68 (07/14 0900) Pulse Rate: 105 (07/14 0900)  Labs: Recent Labs    01/04/18 1119 01/04/18 1135 01/04/18 1544  01/04/18 1825 01/05/18 0203 01/05/18 0850 01/05/18 1229  HGB 10.2* 11.6*  --   --   --   --  9.4*  --   HCT 34.7* 34.0*  --   --   --   --  29.0*  --   PLT 566*  --   --   --   --   --  486*  --   APTT  --   --   --   --  56*  --   --   --   LABPROT  --   --   --   --  15.8*  --   --   --   INR  --   --   --   --  1.27  --   --   --   HEPARINUNFRC  --   --   --   --   --  >2.20*  --  0.31  CREATININE 2.37* 1.70*  --    < > 2.04* 1.67* 1.52*  --   TROPONINI  --   --  2.37*  --  10.60* 40.47*  --   --    < > = values in this interval not displayed.    Estimated Creatinine Clearance: 39.1 mL/min (A) (by C-G formula based on SCr of 1.52 mg/dL (H)).   Medical History: Past Medical History:  Diagnosis Date  . Depression   . Diabetes mellitus without complication ([place])   . Essential hypertension   .  Gastroparesis   . GERD (gastroesophageal reflux disease)   . HLD (hyperlipidemia)     Medications:  Scheduled:  . busPIRone  10 mg Oral TID  . fluticasone  2 spray Each Nare Daily  . pyridostigmine  30 mg Oral TID    Assessment: Pharmacy is consulted to dose heparin in 52 yo female diagnosed with ACS. No listed anticoagulation on PTA med list. Baseline labs obtained.  Baseline Labs:   PT 15.8, INR 1.27   Hgb 11.6, hct 34  Scr 2.17, Crcl 27 ml/min   Troponin 2.37  Today, 7/14  HL = 0.31 is therapeutic on heparin infusion of 650 units/hr  Trop 10.60>40.47  No issues with infusion per RN  Goal of Therapy:  Heparin level 0.3-0.7 units/ml Monitor platelets by anticoagulation protocol: Yes   Plan:   Continue heparin infusion at 650 units/hr  Confirmatory 6  hour HL ordered  Daily CBC and HL  Monitor for signs/symptoms of bleeding  Lenis Noon, PharmD, BCPS Clinical Pharmacist 01/05/2018 1:11 PM

## 2018-01-05 NOTE — Progress Notes (Addendum)
ANTICOAGULATION CONSULT NOTE -  Consult  Pharmacy Consult for IV heparin Indication: chest pain/ACS  Allergies  Allergen Reactions  . Anesthetics, Amide Nausea And Vomiting  . Penicillins Diarrhea, Nausea And Vomiting and Other (See Comments)    Has patient had a PCN reaction causing immediate rash, facial/tongue/throat swelling, SOB or lightheadedness with hypotension: Yes Has patient had a PCN reaction causing severe rash involving mucus membranes or skin necrosis: No Has patient had a PCN reaction that required hospitalization No Has patient had a PCN reaction occurring within the last 10 years: Yes  If all of the above answers are "NO", then may proceed with Cephalosporin use.   . Buprenorphine Hcl Rash  . Encainide Nausea And Vomiting  . Metoclopramide Other (See Comments)    Dystonia, muscle rigidity.     Patient Measurements: Weight: 145 lb 11.6 oz (66.1 kg) Heparin Dosing Weight: 68 kg   Vital Signs: Temp: 99.2 F (37.3 C) (07/14 2000) Temp Source: Oral (07/14 2000) BP: 158/67 (07/14 1900) Pulse Rate: 102 (07/14 1900)  Labs: Recent Labs    01/04/18 1119 01/04/18 1135  01/04/18 1825 01/05/18 0203 01/05/18 0850 01/05/18 1229 01/05/18 1440 01/05/18 1901  HGB 10.2* 11.6*  --   --   --  9.4*  --   --   --   HCT 34.7* 34.0*  --   --   --  29.0*  --   --   --   PLT 566*  --   --   --   --  486*  --   --   --   APTT  --   --   --  56*  --   --   --   --   --   LABPROT  --   --   --  15.8*  --   --   --   --   --   INR  --   --   --  1.27  --   --   --   --   --   HEPARINUNFRC  --   --   --   --  >2.20*  --  0.31  --  1.45*  CREATININE 2.37* 1.70*   < > 2.04* 1.67* 1.52*  --  1.44*  --   TROPONINI  --   --    < > 10.60* 40.47*  --   --   --  10.24*   < > = values in this interval not displayed.    Estimated Creatinine Clearance: 40.7 mL/min (A) (by C-G formula based on SCr of 1.44 mg/dL (H)).   Medical History: Past Medical History:  Diagnosis Date  .  Depression   . Diabetes mellitus without complication ([place])   . Essential hypertension   . Gastroparesis   . GERD (gastroesophageal reflux disease)   . HLD (hyperlipidemia)     Medications:  Scheduled:  . busPIRone  10 mg Oral TID  . fluticasone  2 spray Each Nare Daily  . insulin aspart  0-9 Units Subcutaneous TID WC  . insulin detemir  15 Units Subcutaneous Daily  . pyridostigmine  30 mg Oral TID    Assessment: Pharmacy is consulted to dose heparin in 51 yo female diagnosed with ACS. No listed anticoagulation on PTA med list. Baseline labs obtained.  Baseline Labs:   PT 15.8, INR 1.27   Hgb 11.6, hct 34  Scr 2.17, Crcl 27 ml/min   Troponin 2.37  Today, 7/14  HL = 0.31 is therapeutic on heparin infusion of 650 units/hr  Trop 10.60>40.47  No issues with infusion per RN  1901 HL = 1.45, Lab was drawn on same side as heparin drip- likley not accurate.  Asked lab to redraw but patient is a very difficult stick so they want to give her a break and redraw labs in am.  I did not change rate at this point since this lab is likely incorrect and her last HL was 0.31 which is low end of therapeutic and patient still with elevated troponin. RN was instructed to call pharmacy if he sees any signs of bleeding/bruising that would indicate that level was indeed high.   Goal of Therapy:  Heparin level 0.3-0.7 units/ml Monitor platelets by anticoagulation protocol: Yes   Plan:   Continue heparin infusion at 650 units/hr  Recheck HL in am  Daily CBC/HL  Monitor for signs/symptoms of bleeding   Addendum:  RN able to draw lab off opposite arm IV.  0030 HL= 0.31 at goal, no bleeding or infusion issues per RN.   [facility], will hold off on next level until post cath.   Dorrene German, PharmD Clinical Pharmacist 01/05/2018 10:26 PM

## 2018-01-05 NOTE — Progress Notes (Signed)
  Echocardiogram 2D Echocardiogram has been performed.  [name] 01/05/2018, 10:07 AM

## 2018-01-05 NOTE — Progress Notes (Signed)
PROGRESS NOTE    [name]  YCX:[id number] DOB: July 13, 1965 DOA: 01/04/2018 PCP: [name], MD   Brief Narrative: [name] is a 52 y.o. female with a medical history of diabetes mellitus, medical noncompliance, depression. She presented with DKA, started on IV insulin and now found to have an NSTEMI. Started on heparin drip.    Assessment & Plan:   Active Problems:   Nausea & vomiting   Diabetic neuropathy, type I diabetes mellitus (HCC)   Anxiety and depression   AKI (acute kidney injury) ([place])   Leukocytosis   Noncompliance with treatment plan   DKA (diabetic ketoacidoses) ([place])   Dehydration   DKA, type 1, not at goal [facility])   DKA Type 1 diabetic mellitus Patient has recurrent admissions for DKA, but likely secondary to NSTEMI in current presentation. Anion gap improved with improvement of acidosis, however, anion gap slightly up this morning with slight increase in acidosis -Repeat BMP -Continue IV insulin for now -IV fluids  NSTEMI No chest pain. Troponin significantly elevated to 40.47 today. -Transthoracic Echocardiogram pending -Continue Heparin -Cardiology recommendations  History of gastroparesis -Antiemetics as needed  Acute kidney injury on CKD stage III Baseline of 1.3-1.7. Improved with IV fluids.  Lactic acidosis Repeat lactic acid is pending  SIRS No source. Secondary to DKA/dehydration  Peripheral neuropathy Secondary to diabetes. On Lyrica as an outpatient.  Depression Anxiety -Continue BuSpar   DVT prophylaxis: Heparin drip Code Status:   Code Status: Full Code Family Communication: None at bedside Disposition Plan: Discharge pending DKA and NSTEMI management   Consultants:   Cardiology  Procedures:   7/13>> : Insulin drip  7/14: Transthoracic Echocardiogram (pending)  Antimicrobials:  None    Subjective: Thirsty.   Objective: Vitals:   01/05/18 0730 01/05/18 0800 01/05/18 0830 01/05/18 0900  BP: (!)  113/47 138/71 (!) 147/58 (!) 156/68  Pulse: 99 (!) 109 (!) 108 (!) 105  Resp: 20 19 (!) 37 (!) 35  Temp:  98.1 F (36.7 C)    TempSrc:  Oral    SpO2: 99% 100% 99% 95%    Intake/Output Summary (Last 24 hours) at 01/05/2018 0955 Last data filed at 01/05/2018 4970 Gross per 24 hour  Intake 4996.77 ml  Output 800 ml  Net 4196.77 ml   There were no vitals filed for this visit.  Examination:  General exam: Appears calm and comfortable Respiratory system: Clear to auscultation. Tachypnea. Cardiovascular system: S1 & S2 heard, Tachycardia. No murmurs, rubs, gallops or clicks. Gastrointestinal system: Abdomen is nondistended, soft and nontender. No organomegaly or masses felt. Normal bowel sounds heard. Central nervous system: Alert. No focal neurological deficits. Extremities: No edema. No calf tenderness Skin: No cyanosis. No rashes Psychiatry: Judgement and insight appear normal. Mood & affect appropriate.     Data Reviewed: I have personally reviewed following labs and imaging studies  CBC: Recent Labs  Lab 01/04/18 1119 01/04/18 1135  WBC 23.2*  --   HGB 10.2* 11.6*  HCT 34.7* 34.0*  MCV 94.8  --   PLT 566*  --    Basic Metabolic Panel: Recent Labs  Lab 01/04/18 1119 01/04/18 1135 01/04/18 1651 01/04/18 1825 01/05/18 0203 01/05/18 0850  NA 138 135 145 146* 148* 146*  K 5.2* 5.0 3.9 3.7 3.5 3.6  CL 97* 103 112* 112* 118* 117*  CO2 8*  --  9* 12* 18* 16*  GLUCOSE 892* >700* 489* 391* 135* 177*  BUN 28* 28* 27* 27* 24* 22*  CREATININE 2.37* 1.70*  2.17* 2.04* 1.67* 1.52*  CALCIUM 8.8*  --  8.1* 8.5* 8.3* 8.3*   GFR: Estimated Creatinine Clearance: 39.1 mL/min (A) (by C-G formula based on SCr of 1.52 mg/dL (H)). Liver Function Tests: No results for input(s): AST, ALT, ALKPHOS, BILITOT, PROT, ALBUMIN in the last 168 hours. No results for input(s): LIPASE, AMYLASE in the last 168 hours. No results for input(s): AMMONIA in the last 168 hours. Coagulation  Profile: Recent Labs  Lab 01/04/18 1825  INR 1.27   Cardiac Enzymes: Recent Labs  Lab 01/04/18 1544 01/04/18 1825 01/05/18 0203  TROPONINI 2.37* 10.60* 40.47*   BNP (last 3 results) No results for input(s): PROBNP in the last 8760 hours. HbA1C: No results for input(s): HGBA1C in the last 72 hours. CBG: Recent Labs  Lab 01/05/18 0254 01/05/18 0454 01/05/18 0603 01/05/18 0702 01/05/18 0806  GLUCAP 148* 223* 255* 242* 212*   Lipid Profile: No results for input(s): CHOL, HDL, LDLCALC, TRIG, CHOLHDL, LDLDIRECT in the last 72 hours. Thyroid Function Tests: No results for input(s): TSH, T4TOTAL, FREET4, T3FREE, THYROIDAB in the last 72 hours. Anemia Panel: No results for input(s): VITAMINB12, FOLATE, FERRITIN, TIBC, IRON, RETICCTPCT in the last 72 hours. Sepsis Labs: Recent Labs  Lab 01/04/18 1127  LATICACIDVEN 6.42*    Recent Results (from the past 240 hour(s))  MRSA PCR Screening     Status: None   Collection Time: 01/04/18  3:41 PM  Result Value Ref Range Status   MRSA by PCR NEGATIVE NEGATIVE Final    Comment:        The GeneXpert MRSA Assay (FDA approved for NASAL specimens only), is one component of a comprehensive MRSA colonization surveillance program. It is not intended to diagnose MRSA infection nor to guide or monitor treatment for MRSA infections. Performed at [facility], [place] [address] [place], [place] [address]          Radiology Studies: Dg Abd 1 View  Result Date: 01/04/2018 CLINICAL DATA:  Onset of abdominal pain. EXAM: ABDOMEN - 1 VIEW COMPARISON:  12/23/2017 FINDINGS: Bowel gas pattern is nonobstructive. Again noted is gaseous distension of the stomach, raising the question of aerophagia. Surgical clips are noted in the RIGHT UPPER QUADRANT the abdomen. There is no evidence for free intraperitoneal air. IMPRESSION: 1. No evidence for acute abnormality. 2. Persistent gaseous distension of the stomach. Question of  aerophagia. Electronically Signed   By: Nolon Nations M.D.   On: 01/04/2018 16:50   Dg Chest Portable 1 View  Result Date: 01/04/2018 CLINICAL DATA:  History of diabetes.  Tachypnea. EXAM: PORTABLE CHEST 1 VIEW COMPARISON:  December 23, 2017 FINDINGS: The heart size and mediastinal contours are within normal limits. Both lungs are clear. The visualized skeletal structures are unremarkable. IMPRESSION: No active disease. Electronically Signed   By: Dorise Bullion III M.D   On: 01/04/2018 11:39   [place] Ekg Site Rite  Result Date: 01/05/2018 If Site Rite image not attached, placement could not be confirmed due to current cardiac rhythm.       Scheduled Meds: . nitroGLYCERIN      . busPIRone  10 mg Oral TID  . fluticasone  2 spray Each Nare Daily  . pyridostigmine  30 mg Oral TID   Continuous Infusions: . sodium chloride Stopped (01/04/18 2240)  . dextrose 5 % and 0.45% NaCl Stopped (01/04/18 1145)  . dextrose 5 % and 0.45% NaCl 75 mL/hr at 01/04/18 2240  . heparin 650 Units/hr (01/05/18 0605)  .  insulin (NOVOLIN-R) infusion 3.1 Units/hr (01/05/18 0907)     LOS: 1 day     Cordelia Poche, MD [facility] 01/05/2018, 9:55 AM Pager: [phone]  If 7PM-7AM, please contact night-coverage [email or web address] 01/05/2018, 9:55 AM

## 2018-01-05 NOTE — Progress Notes (Signed)
Spoke with Ginger re PICC placement to be done by IR.  Recommend CVC if access needed prior to IR placement.

## 2018-01-05 NOTE — Progress Notes (Signed)
ANTICOAGULATION CONSULT NOTE -  Consult  Pharmacy Consult for IV heparin Indication: chest pain/ACS  Allergies  Allergen Reactions  . Anesthetics, Amide Nausea And Vomiting  . Penicillins Diarrhea, Nausea And Vomiting and Other (See Comments)    Has patient had a PCN reaction causing immediate rash, facial/tongue/throat swelling, SOB or lightheadedness with hypotension: Yes Has patient had a PCN reaction causing severe rash involving mucus membranes or skin necrosis: No Has patient had a PCN reaction that required hospitalization No Has patient had a PCN reaction occurring within the last 10 years: Yes  If all of the above answers are "NO", then may proceed with Cephalosporin use.   . Buprenorphine Hcl Rash  . Encainide Nausea And Vomiting  . Metoclopramide Other (See Comments)    Dystonia, muscle rigidity.     Patient Measurements:   Heparin Dosing Weight: 68 kg   Vital Signs: Temp: 98.5 F (36.9 C) (07/14 0400) Temp Source: Oral (07/14 0400) BP: 119/100 (07/14 0000) Pulse Rate: 108 (07/14 0000)  Labs: Recent Labs    01/04/18 1119 01/04/18 1135 01/04/18 1544 01/04/18 1651 01/04/18 1825 01/05/18 0203  HGB 10.2* 11.6*  --   --   --   --   HCT 34.7* 34.0*  --   --   --   --   PLT 566*  --   --   --   --   --   APTT  --   --   --   --  56*  --   LABPROT  --   --   --   --  15.8*  --   INR  --   --   --   --  1.27  --   HEPARINUNFRC  --   --   --   --   --  >2.20*  CREATININE 2.37* 1.70*  --  2.17* 2.04* 1.67*  TROPONINI  --   --  2.37*  --  10.60* 40.47*    Estimated Creatinine Clearance: 35.6 mL/min (A) (by C-G formula based on SCr of 1.67 mg/dL (H)).   Medical History: Past Medical History:  Diagnosis Date  . Depression   . Diabetes mellitus without complication ([place])   . Essential hypertension   . Gastroparesis   . GERD (gastroesophageal reflux disease)   . HLD (hyperlipidemia)     Medications:  Scheduled:  . busPIRone  10 mg Oral TID  .  fluticasone  2 spray Each Nare Daily  . LORazepam  0.5 mg Intravenous Once  . pyridostigmine  30 mg Oral TID    Assessment: Pharmacy is consulted to dose heparin in 52 yo female diagnosed with ACS. No listed anticoagulation on PTA med list.  Baseline Labs:   PT 15.8, INR 1.27   Hgb 11.6, hct 34  Scr 2.17, Crcl 27 ml/min   Troponin 2.37 Today, 7/14  0203 HL=>2.20 ??- RN verified lab drawn from opposite arm as heparin infusion, no bleeding reported.   Trop 10.60>40.47  Goal of Therapy:  Heparin level 0.3-0.7 units/ml Monitor platelets by anticoagulation protocol: Yes   Plan:   Hold heparin drip x 30 mins  Restart heparin drip at 0530 at 650 units/hr  Monitor for signs and symptoms of bleeding   Dorrene German 01/05/2018 4:58 AM

## 2018-01-05 NOTE — Consult Note (Signed)
Cardiology Consultation:   Patient ID: [name]; [id number]; 28-Nov-1965   Admit date: 01/04/2018 Date of Consult: 01/05/2018  Primary Care Provider: [name], MD Primary Cardiologist: new to [place]  Primary Electrophysiologist:     Patient Profile:   [name] is a 52 y.o. female with a hx of diabetes mellitus, diabetic gastroparesis medical noncompliance, depression who is being seen today for the evaluation of abdominal pain/back pain and markedly elevated troponin levels at the request of Dr. Ree Kida. Marland Kitchen  History of Present Illness:   [name] is a 52 year old female with a history of diabetes.  She was admitted with diabetic ketoacidosis.  She is been having abdominal pain and back pain.  The abdominal pain is old and is related to her gastroparesis.  The mid to lower back pain is new.  She denies any exertional angina but it should be noted that she really does not exercise to any degree.  Has a history of hypertension and hyperlipidemia. Initial blood gas revealed a pH of 7.082 and a PCO2 of 26.  She was placed on insulin drip.  Her glucose levels and anion gap have improved. Original creatinine was 2.37.  Creatinine is down to 1.67 this morning.  She is quite uncomfortable this morning.  She is having lots of abdominal pain.  She is had shortness of breath with exertion for the past several weeks.  She was admitted recently with another episode of diabetic ketoacidosis.  Past Medical History:  Diagnosis Date  . Depression   . Diabetes mellitus without complication ([place])   . Essential hypertension   . Gastroparesis   . GERD (gastroesophageal reflux disease)   . HLD (hyperlipidemia)     Past Surgical History:  Procedure Laterality Date  . COLONOSCOPY  09/27/2014   at [facility]  . ESOPHAGOGASTRODUODENOSCOPY  09/27/2014   at [facility], Dr Rolan Lipa. biospy neg for celiac, neg for H pylori.   . EYE SURGERY    . gailstones    . IR FLUORO GUIDE CV LINE RIGHT   02/01/2017  . IR FLUORO GUIDE CV LINE RIGHT  03/06/2017  . IR FLUORO GUIDE CV LINE RIGHT  03/25/2017  . IR GENERIC HISTORICAL  01/24/2016   IR FLUORO GUIDE CV LINE RIGHT 01/24/2016 Darrell K Allred, PA-C [facility]-INTERV RAD  . IR GENERIC HISTORICAL  01/24/2016   IR US GUIDE VASC ACCESS RIGHT 01/24/2016 Darrell K Allred, PA-C [facility]INTER[facility]  . IR US GUIDE VASC ACCESS RIGHT  02/01/2017  . IR US GUIDE VASC ACCESS RIGHT  03/06/2017  . IR US GUIDE VASC ACCESS RIGHT  03/25/2017  . POSTERIOR VITRECTOMY AND MEMBRANE PEEL-LEFT EYE  09/28/2002  . POSTERIOR VITRECTOMY AND MEMBRANE PEEL-RIGHT EYE  03/16/2002  . RETINAL DETACHMENT SURGERY       Home Medications:  Prior to Admission medications   Medication Sig Start Date End Date Taking? Authorizing Provider  bisacodyl (DULCOLAX) 5 MG EC tablet Take 5 mg by mouth daily as needed for moderate constipation.   Yes [provider]  busPIRone (BUSPAR) 10 MG tablet Take 1 tablet (10 mg total) by mouth 3 (three) times daily. 11/28/17  Yes [name], MD  furosemide (LASIX) 20 MG tablet Take 1 tablet (20 mg total) by mouth daily as needed for fluid or edema. 11/28/17  Yes Newlin, [name], MD  insulin aspart (NOVOLOG) 100 UNIT/ML injection Inject 0-12 Units into the skin 4 (four) times daily -  before meals and at bedtime. Uses a sliding scale: 70-119 = 0  units, 120-150 = 2 units, 151-180 =  4 units, 181-210 = 5 units, 211-240 = 6 units, 241-270 = 8 units, 271-300 = 9 units, 301-330 = 10 units, 331-360 = 11 units, 361-400 = 12 units, greater than 400 notify MD.   Yes [provider]  insulin detemir (LEVEMIR) 100 UNIT/ML injection Inject 0.15 mLs (15 Units total) into the skin every morning. 11/28/17  Yes [name], MD  mirtazapine (REMERON) 15 MG tablet Take 1 tablet (15 mg total) by mouth at bedtime. 11/28/17  Yes [name], MD  ondansetron (ZOFRAN ODT) 4 MG disintegrating tablet Take 1 tablet (4 mg total) by mouth every 8 (eight) hours as needed for nausea  or vomiting. 12/20/17  Yes Nanavati, Ankit, MD  pantoprazole (PROTONIX) 40 MG tablet Take 1 tablet (40 mg total) by mouth daily. 11/28/17  Yes [name], MD  polyethylene glycol (MIRALAX / GLYCOLAX) packet Take 17 g by mouth daily as needed for mild constipation.  04/11/17  Yes [provider]  pregabalin (LYRICA) 150 MG capsule Take 1 capsule (150 mg total) by mouth daily. 10/16/17  Yes [name], MD  pyridostigmine (MESTINON) 60 MG tablet Take 0.5 tablets (30 mg total) by mouth 3 (three) times daily. Take 1/2 with meals 11/11/17  Yes Eugenie Filler, MD  Syringe/Needle, Disp, (SYRINGE 3CC/22GX1-1/2") 22G X 1-1/2" 3 ML MISC Inject 1,000 mcg into the muscle daily. 11/11/17  Yes Eugenie Filler, MD  fluticasone [facility]) 50 MCG/ACT nasal spray Place 2 sprays into both nostrils daily. Patient not taking: Reported on 01/04/2018 11/12/17   Eugenie Filler, MD    Inpatient Medications: Scheduled Meds: . nitroGLYCERIN      . busPIRone  10 mg Oral TID  . fluticasone  2 spray Each Nare Daily  . pyridostigmine  30 mg Oral TID   Continuous Infusions: . sodium chloride Stopped (01/04/18 2240)  . dextrose 5 % and 0.45% NaCl Stopped (01/04/18 1145)  . dextrose 5 % and 0.45% NaCl 75 mL/hr at 01/04/18 2240  . heparin 650 Units/hr (01/05/18 0605)  . insulin (NOVOLIN-R) infusion 3.1 Units/hr (01/05/18 0907)   PRN Meds: acetaminophen **OR** acetaminophen, ondansetron **OR** ondansetron (ZOFRAN) IV, promethazine  Allergies:    Allergies  Allergen Reactions  . Anesthetics, Amide Nausea And Vomiting  . Penicillins Diarrhea, Nausea And Vomiting and Other (See Comments)    Has patient had a PCN reaction causing immediate rash, facial/tongue/throat swelling, SOB or lightheadedness with hypotension: Yes Has patient had a PCN reaction causing severe rash involving mucus membranes or skin necrosis: No Has patient had a PCN reaction that required hospitalization No Has patient had a PCN  reaction occurring within the last 10 years: Yes  If all of the above answers are "NO", then may proceed with Cephalosporin use.   . Buprenorphine Hcl Rash  . Encainide Nausea And Vomiting  . Metoclopramide Other (See Comments)    Dystonia, muscle rigidity.     Social History:   Social History   Socioeconomic History  . Marital status: Single    Spouse name: Not on file  . Number of children: 1  . Years of education: Not on file  . Highest education level: Not on file  Occupational History  . Occupation: Unemployed  Social Needs  . Financial resource strain: Not on file  . Food insecurity:    Worry: Not on file    Inability: Not on file  . Transportation needs:    Medical: Not on file  Non-medical: Not on file  Tobacco Use  . Smoking status: Never Smoker  . Smokeless tobacco: Never Used  Substance and Sexual Activity  . Alcohol use: No  . Drug use: No  . Sexual activity: Not Currently  Lifestyle  . Physical activity:    Days per week: Not on file    Minutes per session: Not on file  . Stress: Not on file  Relationships  . Social connections:    Talks on phone: Not on file    Gets together: Not on file    Attends religious service: Not on file    Active member of club or organization: Not on file    Attends meetings of clubs or organizations: Not on file    Relationship status: Not on file  . Intimate partner violence:    Fear of current or ex partner: Not on file    Emotionally abused: Not on file    Physically abused: Not on file    Forced sexual activity: Not on file  Other Topics Concern  . Not on file  Social History Narrative  . Not on file    Family History:    Family History  Problem Relation Age of Onset  . Cystic fibrosis Mother   . Hypertension Father   . Diabetes Brother   . Hypertension Maternal Grandmother      ROS:  Please see the history of present illness.   All other ROS reviewed and negative.     Physical Exam/Data:    Vitals:   01/05/18 0730 01/05/18 0800 01/05/18 0830 01/05/18 0900  BP: (!) 113/47 138/71 (!) 147/58 (!) 156/68  Pulse: 99 (!) 109 (!) 108 (!) 105  Resp: 20 19 (!) 37 (!) 35  Temp:  98.1 F (36.7 C)    TempSrc:  Oral    SpO2: 99% 100% 99% 95%    Intake/Output Summary (Last 24 hours) at 01/05/2018 0919 Last data filed at 01/05/2018 8295 Gross per 24 hour  Intake 4996.77 ml  Output 800 ml  Net 4196.77 ml   There were no vitals filed for this visit. There is no height or weight on file to calculate BMI.  General: Middle-aged black female, she is in moderate distress.  She appears to be uncomfortable. HEENT: normal Lymph: no adenopathy Neck: no JVD Endocrine:  No thryomegaly Vascular: No carotid bruits; FA pulses 2+ bilaterally without bruits  Cardiac: Regular rate S1-S2.  She has a soft systolic murmur. Lungs:  clear to auscultation bilaterally, no wheezing, rhonchi or rales  Abd: Abdomen is moderately tender. Ext: no edema Musculoskeletal:  No deformities, BUE and BLE strength normal and equal Skin: warm and dry  Neuro:  CNs 2-12 intact, no focal abnormalities noted Psych:  Normal affect   EKG:  The EKG was personally reviewed and demonstrates:  NSR , she has mild ST segment depression in the inferior and lateral leads.  These are new from her previous 8 EKG several weeks ago. Telemetry:  Telemetry was personally reviewed and demonstrates:   NSR   Relevant CV Studies:   Laboratory Data:  Chemistry Recent Labs  Lab 01/04/18 1651 01/04/18 1825 01/05/18 0203  NA 145 146* 148*  K 3.9 3.7 3.5  CL 112* 112* 118*  CO2 9* 12* 18*  GLUCOSE 489* 391* 135*  BUN 27* 27* 24*  CREATININE 2.17* 2.04* 1.67*  CALCIUM 8.1* 8.5* 8.3*  GFRNONAA 25* 27* 35*  GFRAA 29* 32* 40*  ANIONGAP 24* 22* 12  No results for input(s): PROT, ALBUMIN, AST, ALT, ALKPHOS, BILITOT in the last 168 hours. Hematology Recent Labs  Lab 01/04/18 1119 01/04/18 1135  WBC 23.2*  --   RBC 3.66*   --   HGB 10.2* 11.6*  HCT 34.7* 34.0*  MCV 94.8  --   MCH 27.9  --   MCHC 29.4*  --   RDW 18.0*  --   PLT 566*  --    Cardiac Enzymes Recent Labs  Lab 01/04/18 1544 01/04/18 1825 01/05/18 0203  TROPONINI 2.37* 10.60* 40.47*    Recent Labs  Lab 01/04/18 1131  TROPIPOC 0.15*    BNPNo results for input(s): BNP, PROBNP in the last 168 hours.  DDimer No results for input(s): DDIMER in the last 168 hours.  Radiology/Studies:  Dg Abd 1 View  Result Date: 01/04/2018 CLINICAL DATA:  Onset of abdominal pain. EXAM: ABDOMEN - 1 VIEW COMPARISON:  12/23/2017 FINDINGS: Bowel gas pattern is nonobstructive. Again noted is gaseous distension of the stomach, raising the question of aerophagia. Surgical clips are noted in the RIGHT UPPER QUADRANT the abdomen. There is no evidence for free intraperitoneal air. IMPRESSION: 1. No evidence for acute abnormality. 2. Persistent gaseous distension of the stomach. Question of aerophagia. Electronically Signed   By: Nolon Nations M.D.   On: 01/04/2018 16:50   Dg Chest Portable 1 View  Result Date: 01/04/2018 CLINICAL DATA:  History of diabetes.  Tachypnea. EXAM: PORTABLE CHEST 1 VIEW COMPARISON:  December 23, 2017 FINDINGS: The heart size and mediastinal contours are within normal limits. Both lungs are clear. The visualized skeletal structures are unremarkable. IMPRESSION: No active disease. Electronically Signed   By: Dorise Bullion III M.D   On: 01/04/2018 11:39   [place] Ekg Site Rite  Result Date: 01/05/2018 If Site Rite image not attached, placement could not be confirmed due to current cardiac rhythm.   Assessment and Plan:   1. 1.  Non-ST segment elevation myocardial infarction: Troponin level is 40.47.  This is in the setting of diabetic ketoacidosis.  She is on heparin.  She is not having any chest discomfort.  She will need to have a heart catheterization but I think that we should stabilize her from a diabetic standpoint.  Her acidosis has  continued to improve.  Has a history of gastroparesis and is not able to take oral medications reliably.  We will need to make sure that she is able to take aspirin and Plavix on a reliable basis before we proceed with heart catheterization with possible stenting.  I have ordered a stat echocardiogram this morning.  2.  Diabetic ketoacidosis: Her glucose levels are improving.  Her anion gap is improving.  Her creatinine is also improving.  Further plans per internal medicine.   For questions or updates, please contact [place] Please consult [email or web address] for contact info under Cardiology/STEMI.   Signed, Mertie Moores, MD  01/05/2018 9:19 AM

## 2018-01-06 ENCOUNTER — Inpatient Hospital Stay (HOSPITAL_COMMUNITY): Payer: Medicare Other

## 2018-01-06 LAB — CBC
HEMATOCRIT: 31.2 % — AB (ref 36.0–46.0)
Hemoglobin: 10 g/dL — ABNORMAL LOW (ref 12.0–15.0)
MCH: 27.1 pg (ref 26.0–34.0)
MCHC: 32.1 g/dL (ref 30.0–36.0)
MCV: 84.6 fL (ref 78.0–100.0)
PLATELETS: 396 10*3/uL (ref 150–400)
RBC: 3.69 MIL/uL — AB (ref 3.87–5.11)
RDW: 18.3 % — AB (ref 11.5–15.5)
WBC: 18.4 10*3/uL — AB (ref 4.0–10.5)

## 2018-01-06 LAB — BASIC METABOLIC PANEL
ANION GAP: 9 (ref 5–15)
BUN: 12 mg/dL (ref 6–20)
CALCIUM: 8.6 mg/dL — AB (ref 8.9–10.3)
CO2: 18 mmol/L — ABNORMAL LOW (ref 22–32)
Chloride: 117 mmol/L — ABNORMAL HIGH (ref 98–111)
Creatinine, Ser: 1.04 mg/dL — ABNORMAL HIGH (ref 0.44–1.00)
Glucose, Bld: 59 mg/dL — ABNORMAL LOW (ref 70–99)
POTASSIUM: 4.2 mmol/L (ref 3.5–5.1)
SODIUM: 144 mmol/L (ref 135–145)

## 2018-01-06 LAB — URINALYSIS, ROUTINE W REFLEX MICROSCOPIC
BILIRUBIN URINE: NEGATIVE
Glucose, UA: >=500 mg/dL — AB
Hgb urine dipstick: NEGATIVE
KETONES UR: 5 mg/dL — AB
LEUKOCYTES UA: NEGATIVE
Nitrite: NEGATIVE
PROTEIN: 30 mg/dL — AB
Specific Gravity, Urine: 1.015 (ref 1.005–1.030)
pH: 6 (ref 5.0–8.0)

## 2018-01-06 LAB — GLUCOSE, CAPILLARY
GLUCOSE-CAPILLARY: 109 mg/dL — AB (ref 70–99)
GLUCOSE-CAPILLARY: 52 mg/dL — AB (ref 70–99)
GLUCOSE-CAPILLARY: 88 mg/dL (ref 70–99)
GLUCOSE-CAPILLARY: 140 mg/dL — AB (ref 70–99)
GLUCOSE-CAPILLARY: 306 mg/dL — AB (ref 70–99)
Glucose-Capillary: <10 mg/dL — CL (ref 70–99)
Glucose-Capillary: 56 mg/dL — ABNORMAL LOW (ref 70–99)

## 2018-01-06 LAB — RAPID URINE DRUG SCREEN, HOSP PERFORMED
AMPHETAMINES: NOT DETECTED
BENZODIAZEPINES: NOT DETECTED
Cocaine: NOT DETECTED
Opiates: NOT DETECTED
TETRAHYDROCANNABINOL: NOT DETECTED

## 2018-01-06 LAB — C DIFFICILE QUICK SCREEN W PCR REFLEX
C Diff antigen: NEGATIVE
C Diff interpretation: NOT DETECTED
C Diff toxin: NEGATIVE

## 2018-01-06 LAB — HEPARIN LEVEL (UNFRACTIONATED): Heparin Unfractionated: 0.31 IU/mL (ref 0.30–0.70)

## 2018-01-06 MED ORDER — METOPROLOL TARTRATE 12.5 MG HALF TABLET
12.5000 mg | ORAL_TABLET | Freq: Two times a day (BID) | ORAL | Status: DC
  Administered 2018-01-06 – 2018-01-07 (×4): 12.5 mg via ORAL
  Filled 2018-01-06 (×5): qty 1

## 2018-01-06 MED ORDER — INSULIN DETEMIR 100 UNIT/ML ~~LOC~~ SOLN
5.0000 [IU] | SUBCUTANEOUS | Status: DC
  Administered 2018-01-06: 5 [IU] via SUBCUTANEOUS
  Filled 2018-01-06: qty 0.05

## 2018-01-06 MED ORDER — DEXTROSE 50 % IV SOLN
25.0000 mL | Freq: Once | INTRAVENOUS | Status: AC
  Administered 2018-01-06: 25 mL via INTRAVENOUS

## 2018-01-06 MED ORDER — PYRIDOSTIGMINE BROMIDE 60 MG PO TABS
30.0000 mg | ORAL_TABLET | Freq: Three times a day (TID) | ORAL | Status: DC
  Administered 2018-01-06: 30 mg via ORAL
  Filled 2018-01-06 (×5): qty 0.5

## 2018-01-06 MED ORDER — DEXTROSE 50 % IV SOLN
INTRAVENOUS | Status: AC
  Filled 2018-01-06: qty 50

## 2018-01-06 MED ORDER — LIDOCAINE HCL 1 % IJ SOLN
INTRAMUSCULAR | Status: AC
  Filled 2018-01-06: qty 20

## 2018-01-06 MED ORDER — ROSUVASTATIN CALCIUM 20 MG PO TABS
20.0000 mg | ORAL_TABLET | Freq: Every day | ORAL | Status: DC
  Administered 2018-01-06: 20 mg via ORAL
  Filled 2018-01-06: qty 1

## 2018-01-06 NOTE — Progress Notes (Signed)
MEDICATION-RELATED CONSULT NOTE   IR Procedure Consult - Anticoagulant/Antiplatelet PTA/Inpatient Med List Review by Pharmacist   Procedure: PICC Placement    Completed: 01/06/18 1633  Post-Procedural bleeding risk per IR MD assessment:  LOW  Antithrombotic medications on inpatient or PTA profile prior to procedure:     Heparin IV infusion at 650 units/hr   Recommended restart time per IR Post-Procedure Guidelines:  N/A  Plan:     Continue heparin IV infusion at 650 units/hr  Clayburn Pert, PharmD, BCPS [phone] 01/06/2018  4:52 PM

## 2018-01-06 NOTE — Progress Notes (Signed)
CBG in the 50s, D5 1/2NS restarted, juice given and rechecked. CBg still low so 1/2 amp dextrose given. Patient asymptomatic during this time. CBG rechecked in the 80's. Will continue to monitor.

## 2018-01-06 NOTE — Procedures (Signed)
  Procedure: L arm PICC 35cm DL   EBL:   minimal Complications:  none immediate  See full dictation in [facility].  Dillard Cannon MD Main # [phone] Pager  [phone]

## 2018-01-06 NOTE — Progress Notes (Signed)
PROGRESS NOTE    [name]  JJO:[id number] DOB: May 28, 1966 DOA: 01/04/2018 PCP: [name], MD   Brief Narrative: [name] is a 52 y.o. female with a medical history of diabetes mellitus, medical noncompliance, depression. She presented with DKA, started on IV insulin and now found to have an NSTEMI. Started on heparin drip.    Assessment & Plan:   Active Problems:   Nausea & vomiting   Diabetic neuropathy, type I diabetes mellitus (HCC)   Anxiety and depression   AKI (acute kidney injury) ([place])   Leukocytosis   Noncompliance with treatment plan   DKA (diabetic ketoacidoses) ([place])   Dehydration   DKA, type 1, not at goal [facility])   DKA Type 1 diabetic mellitus Patient has recurrent admissions for DKA, but possibly secondary to NSTEMI in current presentation. DKA resolved and transitioned to subcutaneous insulin -Levemir 5 units daily -IV fluids  Hypoglycemia Patient did not eat well last night. Undetectable blood sugar overnight -Decrease Levemir to 5 units daily  NSTEMI No chest pain. Troponin significantly elevated to 40.47 yesterday. Downtrended today. Transthoracic Echocardiogram significant for apical inferior hypokinesis -Continue Heparin -Cardiology recommendations: plan for heart cath  History of gastroparesis -Antiemetics as needed  Acute kidney injury on CKD stage III Baseline of 1.3-1.7. Improved with IV fluids. Back at baseline.  Lactic acidosis Improved to 2.5 with fluid resuscitation.  SIRS No source. Secondary to DKA/dehydration.  Peripheral neuropathy Secondary to diabetes. On Lyrica as an outpatient.  Depression Anxiety -Continue BuSpar   DVT prophylaxis: Heparin drip Code Status:   Code Status: Full Code Family Communication: None at bedside Disposition Plan: Discharge pending DKA and NSTEMI management   Consultants:   Cardiology  Procedures:   7/13>> : Insulin drip  7/14: Transthoracic Echocardiogram  (pending)  Antimicrobials:  None    Subjective: Hypoglycemia overnight.   Objective: Vitals:   01/06/18 0800 01/06/18 0900 01/06/18 1000 01/06/18 1201  BP: 140/73 135/74 129/67   Pulse: (!) 103 (!) 101 97   Resp: (!) 32 (!) 27 (!) 26   Temp:    99 F (37.2 C)  TempSrc:    Oral  SpO2: 98% 98% 96%   Weight:        Intake/Output Summary (Last 24 hours) at 01/06/2018 1419 Last data filed at 01/06/2018 0900 Gross per 24 hour  Intake 1413.29 ml  Output -  Net 1413.29 ml   Filed Weights   01/05/18 2106  Weight: 66.1 kg (145 lb 11.6 oz)    Examination:  General exam: Appears calm and comfortable Respiratory system: Rales on auscultation. Respiratory effort normal. Cardiovascular system: S1 & S2 heard, RRR. No murmurs. Gastrointestinal system: Abdomen is nondistended, soft and nontender. Normal bowel sounds heard. Central nervous system: Alert and oriented. No focal neurological deficits. Extremities: No edema. No calf tenderness Skin: No cyanosis. No rashes Psychiatry: Judgement and insight appear normal. Mood & affect appropriate.    Data Reviewed: I have personally reviewed following labs and imaging studies  CBC: Recent Labs  Lab 01/04/18 1119 01/04/18 1135 01/05/18 0850 01/06/18 0359  WBC 23.2*  --  28.7* 18.4*  HGB 10.2* 11.6* 9.4* 10.0*  HCT 34.7* 34.0* 29.0* 31.2*  MCV 94.8  --  84.3 84.6  PLT 566*  --  486* 160   Basic Metabolic Panel: Recent Labs  Lab 01/04/18 1825 01/05/18 0203 01/05/18 0850 01/05/18 1440 01/06/18 0753  NA 146* 148* 146* 146* 144  K 3.7 3.5 3.6 4.4 4.2  CL 112* 118* 117*  117* 117*  CO2 12* 18* 16* 17* 18*  GLUCOSE 391* 135* 177* 173* 59*  BUN 27* 24* 22* 19 12  CREATININE 2.04* 1.67* 1.52* 1.44* 1.04*  CALCIUM 8.5* 8.3* 8.3* 8.4* 8.6*   GFR: Estimated Creatinine Clearance: 56.3 mL/min (A) (by C-G formula based on SCr of 1.04 mg/dL (H)). Liver Function Tests: No results for input(s): AST, ALT, ALKPHOS, BILITOT, PROT,  ALBUMIN in the last 168 hours. No results for input(s): LIPASE, AMYLASE in the last 168 hours. No results for input(s): AMMONIA in the last 168 hours. Coagulation Profile: Recent Labs  Lab 01/04/18 1825  INR 1.27   Cardiac Enzymes: Recent Labs  Lab 01/04/18 1544 01/04/18 1825 01/05/18 0203 01/05/18 1901  TROPONINI 2.37* 10.60* 40.47* 10.24*   BNP (last 3 results) No results for input(s): PROBNP in the last 8760 hours. HbA1C: No results for input(s): HGBA1C in the last 72 hours. CBG: Recent Labs  Lab 01/06/18 0434 01/06/18 0732 01/06/18 0810 01/06/18 0846 01/06/18 1132  GLUCAP 109* 52* 56* 88 140*   Lipid Profile: No results for input(s): CHOL, HDL, LDLCALC, TRIG, CHOLHDL, LDLDIRECT in the last 72 hours. Thyroid Function Tests: No results for input(s): TSH, T4TOTAL, FREET4, T3FREE, THYROIDAB in the last 72 hours. Anemia Panel: No results for input(s): VITAMINB12, FOLATE, FERRITIN, TIBC, IRON, RETICCTPCT in the last 72 hours. Sepsis Labs: Recent Labs  Lab 01/04/18 1127 01/05/18 1229  LATICACIDVEN 6.42* 2.5*    Recent Results (from the past 240 hour(s))  MRSA PCR Screening     Status: None   Collection Time: 01/04/18  3:41 PM  Result Value Ref Range Status   MRSA by PCR NEGATIVE NEGATIVE Final    Comment:        The GeneXpert MRSA Assay (FDA approved for NASAL specimens only), is one component of a comprehensive MRSA colonization surveillance program. It is not intended to diagnose MRSA infection nor to guide or monitor treatment for MRSA infections. Performed at [facility], [place] [address] [place], [place] [address]   C difficile quick scan w PCR reflex     Status: None   Collection Time: 01/06/18  6:37 AM  Result Value Ref Range Status   C Diff antigen NEGATIVE NEGATIVE Final   C Diff toxin NEGATIVE NEGATIVE Final   C Diff interpretation No C. difficile detected.  Final    Comment: Performed at [facility]  [place] [address], [place], [place] [address]         Radiology Studies: Dg Abd 1 View  Result Date: 01/04/2018 CLINICAL DATA:  Onset of abdominal pain. EXAM: ABDOMEN - 1 VIEW COMPARISON:  12/23/2017 FINDINGS: Bowel gas pattern is nonobstructive. Again noted is gaseous distension of the stomach, raising the question of aerophagia. Surgical clips are noted in the RIGHT UPPER QUADRANT the abdomen. There is no evidence for free intraperitoneal air. IMPRESSION: 1. No evidence for acute abnormality. 2. Persistent gaseous distension of the stomach. Question of aerophagia. Electronically Signed   By: Nolon Nations M.D.   On: 01/04/2018 16:50   [place] Ekg Site Rite  Result Date: 01/05/2018 If Site Rite image not attached, placement could not be confirmed due to current cardiac rhythm.       Scheduled Meds: . busPIRone  10 mg Oral TID  . fluticasone  2 spray Each Nare Daily  . insulin aspart  0-9 Units Subcutaneous TID WC  . insulin detemir  5 Units Subcutaneous Q24H  . metoprolol tartrate  12.5 mg  Oral BID  . pyridostigmine  30 mg Oral TID WC  . rosuvastatin  20 mg Oral q1800   Continuous Infusions: . dextrose 5 % and 0.45% NaCl 75 mL/hr (01/06/18 0800)  . heparin 650 Units/hr (01/06/18 0700)     LOS: 2 days     Cordelia Poche, MD [facility] 01/06/2018, 2:19 PM Pager: [phone]  If 7PM-7AM, please contact night-coverage [email or web address] 01/06/2018, 2:19 PM

## 2018-01-06 NOTE — Progress Notes (Addendum)
Progress Note  Patient Name: [name] Date of Encounter: 01/06/2018  Primary Cardiologist: Mertie Moores, MD New  Subjective   No chest pain + SOB,  She is worried about cardiac stent with gastroparesis and vomiting meds. She also complains of SOB.  Inpatient Medications    Scheduled Meds: . busPIRone  10 mg Oral TID  . dextrose      . fluticasone  2 spray Each Nare Daily  . insulin aspart  0-9 Units Subcutaneous TID WC  . insulin detemir  5 Units Subcutaneous Q24H  . pyridostigmine  30 mg Oral TID   Continuous Infusions: . dextrose 5 % and 0.45% NaCl 75 mL/hr (01/06/18 0800)  . heparin 650 Units/hr (01/06/18 0700)   PRN Meds: acetaminophen **OR** acetaminophen, ondansetron **OR** ondansetron (ZOFRAN) IV, promethazine   Vital Signs    Vitals:   01/06/18 0700 01/06/18 0754 01/06/18 0800 01/06/18 0900  BP: 136/73  140/73 135/74  Pulse: (!) 108  (!) 103 (!) 101  Resp: (!) 35  (!) 32 (!) 27  Temp:  99.2 F (37.3 C)    TempSrc:  Oral    SpO2: (!) 86%  98% 98%  Weight:        Intake/Output Summary (Last 24 hours) at 01/06/2018 0951 Last data filed at 01/06/2018 0900 Gross per 24 hour  Intake 1413.29 ml  Output -  Net 1413.29 ml   Filed Weights   01/05/18 2106  Weight: 145 lb 11.6 oz (66.1 kg)    Telemetry    SR-ST 98-105 - Personally Reviewed  ECG    No new - Personally Reviewed  Physical Exam   GEN: No acute distress.  But anxious Neck: No JVD Cardiac: RRR, no murmurs, rubs, or gallops.  Respiratory: Clear to auscultation bilaterally. GI: Soft, non to mild tenderness, non-distended  MS: No edema; No deformity. Neuro:  Nonfocal  Psych: Normal affect   Labs    Chemistry Recent Labs  Lab 01/05/18 0850 01/05/18 1440 01/06/18 0753  NA 146* 146* 144  K 3.6 4.4 4.2  CL 117* 117* 117*  CO2 16* 17* 18*  GLUCOSE 177* 173* 59*  BUN 22* 19 12  CREATININE 1.52* 1.44* 1.04*  CALCIUM 8.3* 8.4* 8.6*  GFRNONAA 39* 42* >60  GFRAA 45* 48* >60    ANIONGAP 13 12 9      Hematology Recent Labs  Lab 01/04/18 1119 01/04/18 1135 01/05/18 0850 01/06/18 0359  WBC 23.2*  --  28.7* 18.4*  RBC 3.66*  --  3.44* 3.69*  HGB 10.2* 11.6* 9.4* 10.0*  HCT 34.7* 34.0* 29.0* 31.2*  MCV 94.8  --  84.3 84.6  MCH 27.9  --  27.3 27.1  MCHC 29.4*  --  32.4 32.1  RDW 18.0*  --  18.0* 18.3*  PLT 566*  --  486* 396    Cardiac Enzymes Recent Labs  Lab 01/04/18 1544 01/04/18 1825 01/05/18 0203 01/05/18 1901  TROPONINI 2.37* 10.60* 40.47* 10.24*    Recent Labs  Lab 01/04/18 1131  TROPIPOC 0.15*     BNPNo results for input(s): BNP, PROBNP in the last 168 hours.   DDimer No results for input(s): DDIMER in the last 168 hours.   Radiology    Dg Abd 1 View  Result Date: 01/04/2018 CLINICAL DATA:  Onset of abdominal pain. EXAM: ABDOMEN - 1 VIEW COMPARISON:  12/23/2017 FINDINGS: Bowel gas pattern is nonobstructive. Again noted is gaseous distension of the stomach, raising the question of aerophagia. Surgical clips are noted  in the RIGHT UPPER QUADRANT the abdomen. There is no evidence for free intraperitoneal air. IMPRESSION: 1. No evidence for acute abnormality. 2. Persistent gaseous distension of the stomach. Question of aerophagia. Electronically Signed   By: Nolon Nations M.D.   On: 01/04/2018 16:50   Dg Chest Portable 1 View  Result Date: 01/04/2018 CLINICAL DATA:  History of diabetes.  Tachypnea. EXAM: PORTABLE CHEST 1 VIEW COMPARISON:  December 23, 2017 FINDINGS: The heart size and mediastinal contours are within normal limits. Both lungs are clear. The visualized skeletal structures are unremarkable. IMPRESSION: No active disease. Electronically Signed   By: Dorise Bullion III M.D   On: 01/04/2018 11:39   [place] Ekg Site Rite  Result Date: 01/05/2018 If Site Rite image not attached, placement could not be confirmed due to current cardiac rhythm.   Cardiac Studies   01/05/18 ECHO Study Conclusions  - Left ventricle: The cavity size  was normal. Wall thickness was   normal. The estimated ejection fraction was 55%. There is   hypokinesis of the apicalinferior myocardium. Left ventricular   diastolic function parameters were normal. - Mitral valve: There was mild regurgitation. - Left atrium: The atrium was mildly dilated. - Right atrium: Central venous pressure (est): 8 mm Hg. - Tricuspid valve: There was trivial regurgitation. - Pulmonary arteries: Systolic pressure could not be accurately   estimated. - Pericardium, extracardiac: There was no pericardial effusion.  New apical inf. Hypokinesis of myocardium new from 2016.      Echo from 04/2015  Study Conclusions  - Left ventricle: The cavity size was normal. Systolic function was   normal. The estimated ejection fraction was in the range of 55%   to 60%. Wall motion was normal; there were no regional wall   motion abnormalities. - Mitral valve: There was mild regurgitation. - Atrial septum: No defect or patent foramen ovale was identified. - Pulmonary arteries: PA peak pressure: 40 mm Hg (S).  Patient Profile     52 y.o. female with a hx of diabetes mellitus, diabetic gastroparesis medical noncompliance, depression and admitted 01/04/18 for abd pain and back pain and DKA started on insulin also AKI with Cr 2.37 from 1.3 to 1.7 her usual.  Troponin pk of 40.47 now decreasing.  EKG with inf lat ST depression but similar to prior EKGs.     Assessment & Plan    NSTEMI with troponin 40.47, in setting of DKA, no chest pain + abd pain.  She does have elevated Cr.  Once she is able to take oral meds then proceed with cardiac cath.  Though her N&V is ongoing process.  Board is full today off IV insulin, ? Cath tomorrow   --new apical inf hypokinesis on echo --on IV heparin  DKA followed by IM  Complex with gastroparesis possible need for pacemaker --off IV insulin   AKI  --Now with Cr at 1.04 down from 1.44 yesterday and 2.37 on admit   Non  compliance --will need to take meds if stent is needed.   HTN  --now improved 854 to 627 systolic  --add BB lopressor 12.5 BID  HLD --check lipids and hepatic begin statin.    For questions or updates, please contact [place] Please consult [email or web address] for contact info under Cardiology/STEMI.      Signed, Cecilie Kicks, NP  01/06/2018, 9:51 AM    Pt seen and exmained   I agree with findings as noted above Pt denies CP but gets SOB easily Peak troponin  40  ON exam, lungs are rel clear   Cardiac:   RRR   S1, S2  No S3  nomurmurs   Ext without signif edema  I agree with L heart cath with glucose control is optimized some . Tentatively plan on tomorrow pm or Wed am  Dorris Carnes

## 2018-01-06 NOTE — Plan of Care (Signed)
  Problem: Safety: Goal: Ability to remain free from injury will improve Outcome: Progressing   

## 2018-01-07 ENCOUNTER — Encounter (HOSPITAL_COMMUNITY)
Admission: EM | Disposition: A | Discharge status: Skilled Nursing Facility | Payer: Self-pay | Source: Home / Self Care | Attending: Cardiothoracic Surgery

## 2018-01-07 ENCOUNTER — Inpatient Hospital Stay (HOSPITAL_COMMUNITY): Payer: Medicare Other

## 2018-01-07 DIAGNOSIS — I2510 Atherosclerotic heart disease of native coronary artery without angina pectoris: Secondary | ICD-10-CM

## 2018-01-07 DIAGNOSIS — I214 Non-ST elevation (NSTEMI) myocardial infarction: Secondary | ICD-10-CM

## 2018-01-07 HISTORY — PX: LEFT HEART CATH AND CORONARY ANGIOGRAPHY: CATH118249

## 2018-01-07 LAB — GLUCOSE, CAPILLARY
GLUCOSE-CAPILLARY: 182 mg/dL — AB (ref 70–99)
GLUCOSE-CAPILLARY: 125 mg/dL — AB (ref 70–99)
Glucose-Capillary: <10 mg/dL — CL (ref 70–99)
Glucose-Capillary: 149 mg/dL — ABNORMAL HIGH (ref 70–99)
Glucose-Capillary: 219 mg/dL — ABNORMAL HIGH (ref 70–99)
Glucose-Capillary: 188 mg/dL — ABNORMAL HIGH (ref 70–99)
Glucose-Capillary: 282 mg/dL — ABNORMAL HIGH (ref 70–99)

## 2018-01-07 LAB — CBC
HEMATOCRIT: 37.3 % (ref 36.0–46.0)
Hemoglobin: 11.9 g/dL — ABNORMAL LOW (ref 12.0–15.0)
MCH: 27.5 pg (ref 26.0–34.0)
MCHC: 31.9 g/dL (ref 30.0–36.0)
MCV: 86.3 fL (ref 78.0–100.0)
PLATELETS: 224 10*3/uL (ref 150–400)
RBC: 4.32 MIL/uL (ref 3.87–5.11)
RDW: 18.2 % — ABNORMAL HIGH (ref 11.5–15.5)
WBC: 4.9 10*3/uL (ref 4.0–10.5)

## 2018-01-07 LAB — LIPID PANEL
CHOL/HDL RATIO: 2.6 ratio
Cholesterol: 121 mg/dL (ref 0–200)
HDL: 47 mg/dL (ref >40)
LDL Cholesterol: 54 mg/dL (ref 0–99)
TRIGLYCERIDES: 101 mg/dL (ref <150)
VLDL: 20 mg/dL (ref 0–40)

## 2018-01-07 LAB — HEPATIC FUNCTION PANEL
ALK PHOS: 99 U/L (ref 38–126)
ALT: 22 U/L (ref 0–44)
AST: 29 U/L (ref 15–41)
Albumin: 2.4 g/dL — ABNORMAL LOW (ref 3.5–5.0)
BILIRUBIN INDIRECT: 0.5 mg/dL (ref 0.3–0.9)
Bilirubin, Direct: 0.1 mg/dL (ref 0.0–0.2)
TOTAL PROTEIN: 5.2 g/dL — AB (ref 6.5–8.1)
Total Bilirubin: 0.6 mg/dL (ref 0.3–1.2)

## 2018-01-07 LAB — BASIC METABOLIC PANEL
ANION GAP: 6 (ref 5–15)
BUN: 13 mg/dL (ref 6–20)
CALCIUM: 7.6 mg/dL — AB (ref 8.9–10.3)
CO2: 24 mmol/L (ref 22–32)
Chloride: 109 mmol/L (ref 98–111)
Creatinine, Ser: 0.95 mg/dL (ref 0.44–1.00)
GLUCOSE: 221 mg/dL — AB (ref 70–99)
POTASSIUM: 3.2 mmol/L — AB (ref 3.5–5.1)
Sodium: 139 mmol/L (ref 135–145)

## 2018-01-07 LAB — HEPARIN LEVEL (UNFRACTIONATED): HEPARIN UNFRACTIONATED: 0.11 [IU]/mL — AB (ref 0.30–0.70)

## 2018-01-07 LAB — POCT ACTIVATED CLOTTING TIME: Activated Clotting Time: 120 seconds

## 2018-01-07 SURGERY — LEFT HEART CATH AND CORONARY ANGIOGRAPHY
Anesthesia: LOCAL

## 2018-01-07 MED ORDER — SODIUM CHLORIDE 0.9% FLUSH: 10.0000 mL | INTRAVENOUS | Status: DC | PRN

## 2018-01-07 MED ORDER — INSULIN ASPART 100 UNIT/ML ~~LOC~~ SOLN
0.0000 [IU] | SUBCUTANEOUS | Status: DC
  Administered 2018-01-07 – 2018-01-08 (×3): 2 [IU] via SUBCUTANEOUS
  Administered 2018-01-08: 5 [IU] via SUBCUTANEOUS

## 2018-01-07 MED ORDER — LIDOCAINE HCL (PF) 1 % IJ SOLN
INTRAMUSCULAR | Status: DC | PRN
  Administered 2018-01-07: 15 mL via INTRADERMAL

## 2018-01-07 MED ORDER — HEPARIN (PORCINE) IN NACL 1000-0.9 UT/500ML-% IV SOLN
INTRAVENOUS | Status: AC
  Filled 2018-01-07: qty 1000

## 2018-01-07 MED ORDER — INSULIN DETEMIR 100 UNIT/ML ~~LOC~~ SOLN
10.0000 [IU] | SUBCUTANEOUS | Status: DC
  Filled 2018-01-07 (×2): qty 0.1

## 2018-01-07 MED ORDER — CHLORHEXIDINE GLUCONATE CLOTH 2 % EX PADS
6.0000 | MEDICATED_PAD | Freq: Every day | CUTANEOUS | Status: DC
  Administered 2018-01-07: 6 via TOPICAL

## 2018-01-07 MED ORDER — SODIUM CHLORIDE 0.9 % IV SOLN
INTRAVENOUS | Status: DC
  Administered 2018-01-07: 22:00:00 via INTRAVENOUS

## 2018-01-07 MED ORDER — SODIUM CHLORIDE 0.9% FLUSH
3.0000 mL | Freq: Two times a day (BID) | INTRAVENOUS | Status: DC
  Administered 2018-01-07 – 2018-01-10 (×3): 3 mL via INTRAVENOUS

## 2018-01-07 MED ORDER — ACETAMINOPHEN 650 MG RE SUPP: 650.0000 mg | Freq: Four times a day (QID) | RECTAL | Status: DC | PRN

## 2018-01-07 MED ORDER — MIDAZOLAM HCL 2 MG/2ML IJ SOLN
INTRAMUSCULAR | Status: AC
Start: 2018-01-07 — End: ?
  Filled 2018-01-07: qty 2

## 2018-01-07 MED ORDER — SODIUM CHLORIDE 0.9 % IV SOLN: 250.0000 mL | INTRAVENOUS | Status: DC | PRN

## 2018-01-07 MED ORDER — ASPIRIN 81 MG PO CHEW
81.0000 mg | CHEWABLE_TABLET | ORAL | Status: AC
  Administered 2018-01-07: 81 mg via ORAL
  Filled 2018-01-07: qty 1

## 2018-01-07 MED ORDER — HEPARIN (PORCINE) IN NACL 100-0.45 UNIT/ML-% IJ SOLN
1050.0000 [IU]/h | INTRAMUSCULAR | Status: DC
  Administered 2018-01-08: 900 [IU]/h via INTRAVENOUS
  Administered 2018-01-08: 1200 [IU]/h via INTRAVENOUS
  Administered 2018-01-10 – 2018-01-13 (×5): 1050 [IU]/h via INTRAVENOUS
  Filled 2018-01-07 (×7): qty 250

## 2018-01-07 MED ORDER — PYRIDOSTIGMINE BROMIDE 60 MG PO TABS
30.0000 mg | ORAL_TABLET | Freq: Three times a day (TID) | ORAL | Status: DC
  Administered 2018-01-08 – 2018-01-25 (×24): 30 mg via ORAL
  Filled 2018-01-07 (×52): qty 0.5

## 2018-01-07 MED ORDER — ASPIRIN 81 MG PO CHEW
81.0000 mg | CHEWABLE_TABLET | Freq: Every day | ORAL | Status: DC
  Administered 2018-01-08 – 2018-01-13 (×6): 81 mg via ORAL
  Filled 2018-01-07 (×6): qty 1

## 2018-01-07 MED ORDER — FENTANYL CITRATE (PF) 100 MCG/2ML IJ SOLN
INTRAMUSCULAR | Status: AC
  Filled 2018-01-07: qty 2

## 2018-01-07 MED ORDER — ACETAMINOPHEN 325 MG PO TABS
650.0000 mg | ORAL_TABLET | ORAL | Status: DC | PRN
  Administered 2018-01-07: 650 mg via ORAL

## 2018-01-07 MED ORDER — DEXTROSE-NACL 5-0.45 % IV SOLN: INTRAVENOUS | Status: DC

## 2018-01-07 MED ORDER — HEPARIN (PORCINE) IN NACL 100-0.45 UNIT/ML-% IJ SOLN
800.0000 [IU]/h | INTRAMUSCULAR | Status: DC
  Administered 2018-01-07: 800 [IU]/h via INTRAVENOUS
  Filled 2018-01-07: qty 250

## 2018-01-07 MED ORDER — HEPARIN BOLUS VIA INFUSION
2000.0000 [IU] | Freq: Once | INTRAVENOUS | Status: AC
  Administered 2018-01-07: 2000 [IU] via INTRAVENOUS
  Filled 2018-01-07: qty 2000

## 2018-01-07 MED ORDER — NITROGLYCERIN 1 MG/10 ML FOR IR/CATH LAB
INTRA_ARTERIAL | Status: AC
  Filled 2018-01-07: qty 10

## 2018-01-07 MED ORDER — ATORVASTATIN CALCIUM 80 MG PO TABS
80.0000 mg | ORAL_TABLET | Freq: Every day | ORAL | Status: DC
  Administered 2018-01-08 – 2018-01-24 (×15): 80 mg via ORAL
  Filled 2018-01-07 (×15): qty 1

## 2018-01-07 MED ORDER — POTASSIUM CHLORIDE 10 MEQ/100ML IV SOLN
10.0000 meq | INTRAVENOUS | Status: AC
  Administered 2018-01-07 (×4): 10 meq via INTRAVENOUS
  Filled 2018-01-07 (×4): qty 100

## 2018-01-07 MED ORDER — HEPARIN (PORCINE) IN NACL 1000-0.9 UT/500ML-% IV SOLN
INTRAVENOUS | Status: DC | PRN
  Administered 2018-01-07 (×2): 500 mL

## 2018-01-07 MED ORDER — FENTANYL CITRATE (PF) 100 MCG/2ML IJ SOLN
INTRAMUSCULAR | Status: DC | PRN
  Administered 2018-01-07: 25 ug via INTRAVENOUS

## 2018-01-07 MED ORDER — IOPAMIDOL (ISOVUE-370) INJECTION 76%
INTRAVENOUS | Status: AC
  Filled 2018-01-07: qty 100

## 2018-01-07 MED ORDER — NITROGLYCERIN 1 MG/10 ML FOR IR/CATH LAB
INTRA_ARTERIAL | Status: DC | PRN
  Administered 2018-01-07: 200 ug via INTRACORONARY

## 2018-01-07 MED ORDER — IOPAMIDOL (ISOVUE-370) INJECTION 76%
INTRAVENOUS | Status: DC | PRN
  Administered 2018-01-07: 100 mL via INTRA_ARTERIAL

## 2018-01-07 MED ORDER — SODIUM CHLORIDE 0.9% FLUSH
10.0000 mL | Freq: Two times a day (BID) | INTRAVENOUS | Status: DC
  Administered 2018-01-07 – 2018-01-08 (×3): 10 mL
  Administered 2018-01-09: 20 mL
  Administered 2018-01-10 – 2018-01-13 (×6): 10 mL

## 2018-01-07 MED ORDER — ACETAMINOPHEN 160 MG/5ML PO SOLN
650.0000 mg | Freq: Four times a day (QID) | ORAL | Status: DC | PRN
  Filled 2018-01-07 (×2): qty 20.3

## 2018-01-07 MED ORDER — LIDOCAINE HCL (PF) 1 % IJ SOLN
INTRAMUSCULAR | Status: AC
  Filled 2018-01-07: qty 30

## 2018-01-07 MED ORDER — SODIUM CHLORIDE 0.9% FLUSH: 3.0000 mL | INTRAVENOUS | Status: DC | PRN

## 2018-01-07 MED ORDER — MIDAZOLAM HCL 2 MG/2ML IJ SOLN
INTRAMUSCULAR | Status: DC | PRN
  Administered 2018-01-07: 2 mg via INTRAVENOUS

## 2018-01-07 MED ORDER — DIAZEPAM 5 MG PO TABS
5.0000 mg | ORAL_TABLET | Freq: Four times a day (QID) | ORAL | Status: DC | PRN
  Administered 2018-01-07 – 2018-01-09 (×4): 5 mg via ORAL
  Filled 2018-01-07 (×4): qty 1

## 2018-01-07 MED ORDER — SODIUM CHLORIDE 0.9 % IV SOLN
INTRAVENOUS | Status: DC
  Administered 2018-01-07: 12:00:00 via INTRAVENOUS

## 2018-01-07 MED ORDER — ONDANSETRON HCL 4 MG/2ML IJ SOLN
4.0000 mg | Freq: Four times a day (QID) | INTRAMUSCULAR | Status: DC | PRN
  Administered 2018-01-08: 4 mg via INTRAVENOUS
  Filled 2018-01-07: qty 2

## 2018-01-07 SURGICAL SUPPLY — 9 items
CATH INFINITI 5 FR JR3.5 (CATHETERS) ×1 IMPLANT
CATH INFINITI 5FR MULTPACK ANG (CATHETERS) ×1 IMPLANT
KIT HEART LEFT (KITS) ×2 IMPLANT
PACK CARDIAC CATHETERIZATION (CUSTOM PROCEDURE TRAY) ×2 IMPLANT
SHEATH PINNACLE 5F 10CM (SHEATH) ×1 IMPLANT
SYR MEDRAD MARK V 150ML (SYRINGE) ×2 IMPLANT
TRANSDUCER W/STOPCOCK (MISCELLANEOUS) ×2 IMPLANT
TUBING CIL FLEX 10 FLL-RA (TUBING) ×2 IMPLANT
WIRE EMERALD 3MM-J .035X150CM (WIRE) ×1 IMPLANT

## 2018-01-07 NOTE — H&P (View-Only) (Signed)
Progress Note  Patient Name: [name] Date of Encounter: 01/07/2018  Primary Cardiologist: Mertie Moores, MD   Subjective   No chest pain, no SOB complains of weakness.  She was able to sit up for exam.   Inpatient Medications    Scheduled Meds: . busPIRone  10 mg Oral TID  . Chlorhexidine Gluconate Cloth  6 each Topical Daily  . fluticasone  2 spray Each Nare Daily  . insulin aspart  0-9 Units Subcutaneous TID WC  . insulin detemir  5 Units Subcutaneous Q24H  . metoprolol tartrate  12.5 mg Oral BID  . pyridostigmine  30 mg Oral TID WC  . rosuvastatin  20 mg Oral q1800  . sodium chloride flush  10-40 mL Intracatheter Q12H   Continuous Infusions: . dextrose 5 % and 0.45% NaCl 75 mL/hr (01/06/18 0800)  . heparin 800 Units/hr (01/07/18 0708)  . potassium chloride     PRN Meds: acetaminophen **OR** acetaminophen, ondansetron **OR** ondansetron (ZOFRAN) IV, promethazine, sodium chloride flush   Vital Signs    Vitals:   01/07/18 0300 01/07/18 0400 01/07/18 0500 01/07/18 0600  BP: 118/67 113/65 126/69 114/60  Pulse: 82 80 81 79  Resp: (!) 22 19 (!) 28 18  Temp:  98.2 F (36.8 C)    TempSrc:  Oral    SpO2: 100% 98% 100% 99%  Weight:        Intake/Output Summary (Last 24 hours) at 01/07/2018 0752 Last data filed at 01/06/2018 2000 Gross per 24 hour  Intake 1218 ml  Output 300 ml  Net 918 ml   Filed Weights   01/05/18 2106  Weight: 145 lb 11.6 oz (66.1 kg)    Telemetry    SR - Personally Reviewed  ECG    No new - Personally Reviewed  Physical Exam   GEN: No acute distress.   Neck: No JVD Cardiac: RRR, no murmurs, rubs, or gallops.  Respiratory: Clear to auscultation bilaterally. GI: Soft, nontender, non-distended  MS: No edema; No deformity. Neuro:  Nonfocal  Psych: flat affect   Labs    Chemistry Recent Labs  Lab 01/05/18 1440 01/06/18 0753 01/07/18 0510  NA 146* 144 139  K 4.4 4.2 3.2*  CL 117* 117* 109  CO2 17* 18* 24  GLUCOSE  173* 59* 221*  BUN 19 12 13   CREATININE 1.44* 1.04* 0.95  CALCIUM 8.4* 8.6* 7.6*  PROT  --   --  5.2*  ALBUMIN  --   --  2.4*  AST  --   --  29  ALT  --   --  22  ALKPHOS  --   --  99  BILITOT  --   --  0.6  GFRNONAA 42* >60 >60  GFRAA 48* >60 >60  ANIONGAP 12 9 6      Hematology Recent Labs  Lab 01/05/18 0850 01/06/18 0359 01/07/18 0510  WBC 28.7* 18.4* 4.9  RBC 3.44* 3.69* 4.32  HGB 9.4* 10.0* 11.9*  HCT 29.0* 31.2* 37.3  MCV 84.3 84.6 86.3  MCH 27.3 27.1 27.5  MCHC 32.4 32.1 31.9  RDW 18.0* 18.3* 18.2*  PLT 486* 396 224    Cardiac Enzymes Recent Labs  Lab 01/04/18 1544 01/04/18 1825 01/05/18 0203 01/05/18 1901  TROPONINI 2.37* 10.60* 40.47* 10.24*    Recent Labs  Lab 01/04/18 1131  TROPIPOC 0.15*     BNPNo results for input(s): BNP, PROBNP in the last 168 hours.   DDimer No results for input(s): DDIMER in the  last 168 hours.   Radiology    Irpicc Placement Left >5 Yrs Inc Img Guide  Result Date: 01/06/2018 CLINICAL DATA:  STEMI, poor venous access EXAM: PICC PLACEMENT WITH ULTRASOUND AND FLUOROSCOPY FLUOROSCOPY TIME:  0.1 minute; 34 uGym2 DAP TECHNIQUE: After written informed consent was obtained, patient was placed in the supine position on angiographic table. Patency of the left basilic vein was confirmed with ultrasound with image documentation. An appropriate skin site was determined. Skin site was marked. Region was prepped using maximum barrier technique including cap and mask, sterile gown, sterile gloves, large sterile sheet, and Chlorhexidine as cutaneous antisepsis. The region was infiltrated locally with 1% lidocaine. Under real-time ultrasound guidance, the left basilic vein was accessed with a 21 gauge micropuncture needle; the needle tip within the vein was confirmed with ultrasound image documentation. Needle exchanged over a 018 guidewire for a peel-away sheath, through which a 5-French double-lumen power injectable PICC trimmed to 35cm was  advanced, positioned with its tip near the cavoatrial junction. Spot chest radiograph confirms appropriate catheter position. Catheter was flushed per protocol and secured externally. The patient tolerated procedure well. COMPLICATIONS: COMPLICATIONS none IMPRESSION: 1. Technically successful five [place] double lumen power injectable PICC placement Electronically Signed   By: Lucrezia Europe M.D.   On: 01/06/2018 16:55   [place] Ekg Site Rite  Result Date: 01/05/2018 If Site Rite image not attached, placement could not be confirmed due to current cardiac rhythm.   Cardiac Studies   01/05/18 ECHO Study Conclusions  - Left ventricle: The cavity size was normal. Wall thickness was normal. The estimated ejection fraction was 55%. There is hypokinesis of the apicalinferior myocardium. Left ventricular diastolic function parameters were normal. - Mitral valve: There was mild regurgitation. - Left atrium: The atrium was mildly dilated. - Right atrium: Central venous pressure (est): 8 mm Hg. - Tricuspid valve: There was trivial regurgitation. - Pulmonary arteries: Systolic pressure could not be accurately estimated. - Pericardium, extracardiac: There was no pericardial effusion.  New apical inf. Hypokinesis of myocardium new from 2016.      Echo from 04/2015  Study Conclusions  - Left ventricle: The cavity size was normal. Systolic function was normal. The estimated ejection fraction was in the range of 55% to 60%. Wall motion was normal; there were no regional wall motion abnormalities. - Mitral valve: There was mild regurgitation. - Atrial septum: No defect or patent foramen ovale was identified. - Pulmonary arteries: PA peak pressure: 40 mm Hg (S).    Patient Profile     52 y.o. female with a hx of diabetes mellitus, diabetic gastroparesis medical noncompliance, depression and admitted 01/04/18 for abd pain and back pain and DKA started on insulin also AKI with Cr 2.37  from 1.3 to 1.7 her usual.  Troponin pk of 40.47 now decreasing.  EKG with inf lat ST depression but similar to prior EKGs.   Assessment & Plan    NSTEMI with troponin 40.47, in setting of DKA, no chest pain + abd pain.  She does have elevated Cr.  Once she is able to take oral meds then proceed with cardiac cath.  Though her N&V is ongoing process.  Board is full today off IV insulin, cath today at 1530   --new apical inf hypokinesis on echo --on IV heparin The patient understands that risks included but are not limited to stroke (1 in 1000), death (1 in 1000), kidney failure [usually temporary] (1 in 500), bleeding (1 in 200), allergic reaction [possibly  serious] (1 in 200).    DKA followed by IM  Complex with gastroparesis possible need for pacemaker --off IV insulin  --glucose from 306 to 219.   AKI  --Now with Cr at 0.95  down from 1.04 yesterday and 2.37 on admit   Non compliance --will need to take meds if stent is needed.   HTN  --now improved 126/69 to 445/14  systolic  --add BB lopressor 12.5 BID  HLD --check lipids and hepatic begin statin.    Hypokalemia rec'ing IV K+ replacement.       For questions or updates, please contact [place] Please consult [email or web address] for contact info under Cardiology/STEMI.      Signed, Cecilie Kicks, NP  01/07/2018, 7:52 AM    Pt seen and examined    I agree with findings of L [place] above     Pt deneis CP   Gets SOB with activity  On exam:   Pt laying flat   Lungs CTA   Cardiac RRR   No S3   Abd benign   Ext without edema   With Hx of DM, SOB  and marked elevattion in troponin (40) recomm L heart cath to define anatomy.   Plan for today    Further f/u based on results.    Dorris Carnes

## 2018-01-07 NOTE — Progress Notes (Signed)
Inpatient Diabetes Program Recommendations  AACE/ADA: New Consensus Statement on Inpatient Glycemic Control (2015)  Target Ranges:  Prepandial:   less than 140 mg/dL      Peak postprandial:   less than 180 mg/dL (1-2 hours)      Critically ill patients:  140 - 180 mg/dL   Lab Results  Component Value Date   GLUCAP 219 (H) 01/07/2018   HGBA1C 8.8 (H) 12/24/2017    Review of Glycemic Control  Diabetes history:  Outpatient Diabetes medications: Levemir 15 units daily, Novolog custom sliding scale 0-12 units four times per day Current orders for Inpatient glycemic control:Levemir 5 units + Novolog sensitive tid  Inpatient Diabetes Program Recommendations:   -Increase Levemir to 10 units daily  Thank you, Nani Gasser. Cypher Paule, RN, MSN, CDE  Diabetes Coordinator Inpatient Glycemic Control Team Team Pager [phone] (8am-5pm) 01/07/2018 10:10 AM

## 2018-01-07 NOTE — Progress Notes (Signed)
ANTICOAGULATION CONSULT NOTE -  Follow Up Consult  Pharmacy Consult for IV heparin Indication: chest pain/ACS  Allergies  Allergen Reactions  . Anesthetics, Amide Nausea And Vomiting  . Penicillins Diarrhea, Nausea And Vomiting and Other (See Comments)    Has patient had a PCN reaction causing immediate rash, facial/tongue/throat swelling, SOB or lightheadedness with hypotension: Yes Has patient had a PCN reaction causing severe rash involving mucus membranes or skin necrosis: No Has patient had a PCN reaction that required hospitalization No Has patient had a PCN reaction occurring within the last 10 years: Yes  If all of the above answers are "NO", then may proceed with Cephalosporin use.   . Buprenorphine Hcl Rash  . Encainide Nausea And Vomiting  . Metoclopramide Other (See Comments)    Dystonia, muscle rigidity.     Patient Measurements: Weight: 145 lb 11.6 oz (66.1 kg) Heparin Dosing Weight: 68 kg   Vital Signs: Temp: 98.2 F (36.8 C) (07/16 0400) Temp Source: Oral (07/16 0400) BP: 114/60 (07/16 0600) Pulse Rate: 79 (07/16 0600)  Labs: Recent Labs    01/04/18 1825 01/05/18 0203 01/05/18 0850  01/05/18 1440 01/05/18 1901 01/06/18 0030 01/06/18 0359 01/06/18 0753 01/07/18 0510  HGB  --   --  9.4*  --   --   --   --  10.0*  --  11.9*  HCT  --   --  29.0*  --   --   --   --  31.2*  --  37.3  PLT  --   --  486*  --   --   --   --  396  --  224  APTT 56*  --   --   --   --   --   --   --   --   --   LABPROT 15.8*  --   --   --   --   --   --   --   --   --   INR 1.27  --   --   --   --   --   --   --   --   --   HEPARINUNFRC  --  >2.20*  --    < >  --  1.45* 0.31  --   --  0.11*  CREATININE 2.04* 1.67* 1.52*  --  1.44*  --   --   --  1.04* 0.95  TROPONINI 10.60* 40.47*  --   --   --  10.24*  --   --   --   --    < > = values in this interval not displayed.    Estimated Creatinine Clearance: 61.6 mL/min (by C-G formula based on SCr of 0.95 mg/dL).   Medical  History: Past Medical History:  Diagnosis Date  . Depression   . Diabetes mellitus without complication ([place])   . Essential hypertension   . Gastroparesis   . GERD (gastroesophageal reflux disease)   . HLD (hyperlipidemia)     Medications:  Scheduled:  . busPIRone  10 mg Oral TID  . fluticasone  2 spray Each Nare Daily  . insulin aspart  0-9 Units Subcutaneous TID WC  . insulin detemir  5 Units Subcutaneous Q24H  . metoprolol tartrate  12.5 mg Oral BID  . pyridostigmine  30 mg Oral TID WC  . rosuvastatin  20 mg Oral q1800    Assessment: Pharmacy is consulted to dose heparin in 52 yo female  diagnosed with ACS. No listed anticoagulation on PTA med list. Baseline labs obtained.  Baseline Labs:   PT 15.8, INR 1.27   Hgb 11.6, hct 34  Scr 2.17, Crcl 27 ml/min   Troponin 2.37  Today, 7/14  Heparin level = 0.11 is SUBtherapeutic on heparin infusion of 650 units/hr  CBC: Hgb improved 11.9, Plts WNL  SCr tending down, 0.95 today  Trop trending down, 10.24 on 7/14  No issues with infusion or bleeding complications documented  Goal of Therapy:  Heparin level 0.3-0.7 units/ml Monitor platelets by anticoagulation protocol: Yes   Plan:   Heparin 2000 units IV bolus  Increase heparin infusion to 800 units/hr  Recheck heparin level in 6hrs  Daily CBC/HL  Follow up plans for cath/anticoagulation   Peggyann Juba, PharmD, BCPS Pager: [phone] 01/07/2018 6:57 AM

## 2018-01-07 NOTE — Progress Notes (Signed)
PROGRESS NOTE    [name]  RFF:[id number] DOB: 31-Aug-1965 DOA: 01/04/2018 PCP: [name], MD   Brief Narrative: [name] is a 52 y.o. female with a medical history of diabetes mellitus, medical noncompliance, depression. She presented with DKA, started on IV insulin and now found to have an NSTEMI. Started on heparin drip. DKA resolved. Plan for cath.   Assessment & Plan:   Active Problems:   Nausea & vomiting   Diabetic neuropathy, type I diabetes mellitus (HCC)   Anxiety and depression   AKI (acute kidney injury) ([place])   Leukocytosis   Noncompliance with treatment plan   DKA (diabetic ketoacidoses) ([place])   Dehydration   DKA, type 1, not at goal [facility])   DKA Type 1 diabetic mellitus Patient has recurrent admissions for DKA, but possibly secondary to NSTEMI in current presentation. DKA resolved and transitioned to subcutaneous insulin. Blood sugar elevated today while NPO on D5 fluids. -Levemir 10 units tonight -SSI q4 hours while NPO -IV fluids  Hypoglycemia Resolved. In setting of poor oral intake and insulin. -management above  NSTEMI No chest pain. Troponin significantly elevated to 40.47 peak, now trended down. Transthoracic Echocardiogram significant for apical inferior hypokinesis -Continue Heparin -Cardiology recommendations: plan for heart cath  History of gastroparesis -Antiemetics as needed  Acute kidney injury on CKD stage III Baseline of 1.3-1.7. Improved with IV fluids. Back at baseline.  Lactic acidosis Improved to 2.5 with fluid resuscitation.  SIRS No source. Secondary to DKA/dehydration.  Peripheral neuropathy Secondary to diabetes. On Lyrica as an outpatient.  Depression Anxiety -Continue BuSpar   DVT prophylaxis: Heparin drip Code Status:   Code Status: Full Code Family Communication: None at bedside Disposition Plan: Discharge pending DKA and NSTEMI management   Consultants:   Cardiology  Procedures:    7/13>> : Insulin drip  7/14: Transthoracic Echocardiogram (pending)  Antimicrobials:  None    Subjective: No hypoglycemia. Feels fine. No chest pain or dyspnea.  Objective: Vitals:   01/07/18 0400 01/07/18 0500 01/07/18 0600 01/07/18 0730  BP: 113/65 126/69 114/60   Pulse: 80 81 79   Resp: 19 (!) 28 18   Temp: 98.2 F (36.8 C)   98.1 F (36.7 C)  TempSrc: Oral   Oral  SpO2: 98% 100% 99%   Weight:        Intake/Output Summary (Last 24 hours) at 01/07/2018 1202 Last data filed at 01/07/2018 0730 Gross per 24 hour  Intake 892 ml  Output 800 ml  Net 92 ml   Filed Weights   01/05/18 2106  Weight: 66.1 kg (145 lb 11.6 oz)    Examination:  General exam: Appears calm and comfortable Respiratory system: Clear to auscultation. Respiratory effort normal. Cardiovascular system: S1 & S2 heard, RRR. No murmurs, rubs, gallops or clicks. Gastrointestinal system: Abdomen is nondistended, soft and nontender. No organomegaly or masses felt. Normal bowel sounds heard. Central nervous system: Alert and oriented. No focal neurological deficits. Extremities: No edema. No calf tenderness Skin: No cyanosis. No rashes Psychiatry: Judgement and insight appear normal. Flat affect   Data Reviewed: I have personally reviewed following labs and imaging studies  CBC: Recent Labs  Lab 01/04/18 1119 01/04/18 1135 01/05/18 0850 01/06/18 0359 01/07/18 0510  WBC 23.2*  --  28.7* 18.4* 4.9  HGB 10.2* 11.6* 9.4* 10.0* 11.9*  HCT 34.7* 34.0* 29.0* 31.2* 37.3  MCV 94.8  --  84.3 84.6 86.3  PLT 566*  --  486* 396 357   Basic Metabolic Panel: Recent  Labs  Lab 01/05/18 0203 01/05/18 0850 01/05/18 1440 01/06/18 0753 01/07/18 0510  NA 148* 146* 146* 144 139  K 3.5 3.6 4.4 4.2 3.2*  CL 118* 117* 117* 117* 109  CO2 18* 16* 17* 18* 24  GLUCOSE 135* 177* 173* 59* 221*  BUN 24* 22* 19 12 13   CREATININE 1.67* 1.52* 1.44* 1.04* 0.95  CALCIUM 8.3* 8.3* 8.4* 8.6* 7.6*   GFR: Estimated  Creatinine Clearance: 61.6 mL/min (by C-G formula based on SCr of 0.95 mg/dL). Liver Function Tests: Recent Labs  Lab 01/07/18 0510  AST 29  ALT 22  ALKPHOS 99  BILITOT 0.6  PROT 5.2*  ALBUMIN 2.4*   No results for input(s): LIPASE, AMYLASE in the last 168 hours. No results for input(s): AMMONIA in the last 168 hours. Coagulation Profile: Recent Labs  Lab 01/04/18 1825  INR 1.27   Cardiac Enzymes: Recent Labs  Lab 01/04/18 1544 01/04/18 1825 01/05/18 0203 01/05/18 1901  TROPONINI 2.37* 10.60* 40.47* 10.24*   BNP (last 3 results) No results for input(s): PROBNP in the last 8760 hours. HbA1C: No results for input(s): HGBA1C in the last 72 hours. CBG: Recent Labs  Lab 01/06/18 1132 01/06/18 1707 01/06/18 2058 01/07/18 0752 01/07/18 1144  GLUCAP 140* 306* 149* 219* 182*   Lipid Profile: Recent Labs    01/07/18 0510  CHOL 121  HDL 47  LDLCALC 54  TRIG 101  CHOLHDL 2.6   Thyroid Function Tests: No results for input(s): TSH, T4TOTAL, FREET4, T3FREE, THYROIDAB in the last 72 hours. Anemia Panel: No results for input(s): VITAMINB12, FOLATE, FERRITIN, TIBC, IRON, RETICCTPCT in the last 72 hours. Sepsis Labs: Recent Labs  Lab 01/04/18 1127 01/05/18 1229  LATICACIDVEN 6.42* 2.5*    Recent Results (from the past 240 hour(s))  MRSA PCR Screening     Status: None   Collection Time: 01/04/18  3:41 PM  Result Value Ref Range Status   MRSA by PCR NEGATIVE NEGATIVE Final    Comment:        The GeneXpert MRSA Assay (FDA approved for NASAL specimens only), is one component of a comprehensive MRSA colonization surveillance program. It is not intended to diagnose MRSA infection nor to guide or monitor treatment for MRSA infections. Performed at [facility], [place] [address], [place], [place] [address]   C difficile quick scan w PCR reflex     Status: None   Collection Time: 01/06/18  6:37 AM  Result Value Ref Range Status   C Diff  antigen NEGATIVE NEGATIVE Final   C Diff toxin NEGATIVE NEGATIVE Final   C Diff interpretation No C. difficile detected.  Final    Comment: Performed at [facility], [place] [address], [place], [place] [address]         Radiology Studies: Dg Chest Port 1 View  Result Date: 01/07/2018 CLINICAL DATA:  PICC placement EXAM: PORTABLE CHEST 1 VIEW COMPARISON:  01/04/2018 FINDINGS: Left PICC line in place with the tip in the SVC. Patchy airspace disease bilaterally, right greater than left. Small right effusion. Heart is normal size. IMPRESSION: Worsening bilateral airspace disease, right greater than left concerning for multifocal pneumonia. Small right effusion. Left PICC line tip in the lower SVC. Electronically Signed   By: Rolm Baptise M.D.   On: 01/07/2018 10:11   Irpicc Placement Left >5 Yrs Inc Img Guide  Result Date: 01/06/2018 CLINICAL DATA:  STEMI, poor venous access EXAM: PICC PLACEMENT WITH ULTRASOUND AND FLUOROSCOPY FLUOROSCOPY TIME:  0.1 minute; 34 uGym2 DAP TECHNIQUE: After written informed consent was obtained, patient was placed in the supine position on angiographic table. Patency of the left basilic vein was confirmed with ultrasound with image documentation. An appropriate skin site was determined. Skin site was marked. Region was prepped using maximum barrier technique including cap and mask, sterile gown, sterile gloves, large sterile sheet, and Chlorhexidine as cutaneous antisepsis. The region was infiltrated locally with 1% lidocaine. Under real-time ultrasound guidance, the left basilic vein was accessed with a 21 gauge micropuncture needle; the needle tip within the vein was confirmed with ultrasound image documentation. Needle exchanged over a 018 guidewire for a peel-away sheath, through which a 5-French double-lumen power injectable PICC trimmed to 35cm was advanced, positioned with its tip near the cavoatrial junction. Spot chest radiograph confirms  appropriate catheter position. Catheter was flushed per protocol and secured externally. The patient tolerated procedure well. COMPLICATIONS: COMPLICATIONS none IMPRESSION: 1. Technically successful five [place] double lumen power injectable PICC placement Electronically Signed   By: Lucrezia Europe M.D.   On: 01/06/2018 16:55        Scheduled Meds: . busPIRone  10 mg Oral TID  . Chlorhexidine Gluconate Cloth  6 each Topical Daily  . fluticasone  2 spray Each Nare Daily  . insulin aspart  0-9 Units Subcutaneous Q4H  . insulin detemir  5 Units Subcutaneous Q24H  . metoprolol tartrate  12.5 mg Oral BID  . pyridostigmine  30 mg Oral TID WC  . rosuvastatin  20 mg Oral q1800  . sodium chloride flush  10-40 mL Intracatheter Q12H   Continuous Infusions: . dextrose 5 % and 0.45% NaCl    . heparin 800 Units/hr (01/07/18 0708)  . potassium chloride Stopped (01/07/18 1159)     LOS: 3 days     Cordelia Poche, MD [facility] 01/07/2018, 12:02 PM Pager: [phone]  If 7PM-7AM, please contact night-coverage [email or web address] 01/07/2018, 12:02 PM

## 2018-01-07 NOTE — Progress Notes (Addendum)
Progress Note  Patient Name: [name] Date of Encounter: 01/07/2018  Primary Cardiologist: Mertie Moores, MD   Subjective   No chest pain, no SOB complains of weakness.  She was able to sit up for exam.   Inpatient Medications    Scheduled Meds: . busPIRone  10 mg Oral TID  . Chlorhexidine Gluconate Cloth  6 each Topical Daily  . fluticasone  2 spray Each Nare Daily  . insulin aspart  0-9 Units Subcutaneous TID WC  . insulin detemir  5 Units Subcutaneous Q24H  . metoprolol tartrate  12.5 mg Oral BID  . pyridostigmine  30 mg Oral TID WC  . rosuvastatin  20 mg Oral q1800  . sodium chloride flush  10-40 mL Intracatheter Q12H   Continuous Infusions: . dextrose 5 % and 0.45% NaCl 75 mL/hr (01/06/18 0800)  . heparin 800 Units/hr (01/07/18 0708)  . potassium chloride     PRN Meds: acetaminophen **OR** acetaminophen, ondansetron **OR** ondansetron (ZOFRAN) IV, promethazine, sodium chloride flush   Vital Signs    Vitals:   01/07/18 0300 01/07/18 0400 01/07/18 0500 01/07/18 0600  BP: 118/67 113/65 126/69 114/60  Pulse: 82 80 81 79  Resp: (!) 22 19 (!) 28 18  Temp:  98.2 F (36.8 C)    TempSrc:  Oral    SpO2: 100% 98% 100% 99%  Weight:        Intake/Output Summary (Last 24 hours) at 01/07/2018 0752 Last data filed at 01/06/2018 2000 Gross per 24 hour  Intake 1218 ml  Output 300 ml  Net 918 ml   Filed Weights   01/05/18 2106  Weight: 145 lb 11.6 oz (66.1 kg)    Telemetry    SR - Personally Reviewed  ECG    No new - Personally Reviewed  Physical Exam   GEN: No acute distress.   Neck: No JVD Cardiac: RRR, no murmurs, rubs, or gallops.  Respiratory: Clear to auscultation bilaterally. GI: Soft, nontender, non-distended  MS: No edema; No deformity. Neuro:  Nonfocal  Psych: flat affect   Labs    Chemistry Recent Labs  Lab 01/05/18 1440 01/06/18 0753 01/07/18 0510  NA 146* 144 139  K 4.4 4.2 3.2*  CL 117* 117* 109  CO2 17* 18* 24  GLUCOSE  173* 59* 221*  BUN 19 12 13   CREATININE 1.44* 1.04* 0.95  CALCIUM 8.4* 8.6* 7.6*  PROT  --   --  5.2*  ALBUMIN  --   --  2.4*  AST  --   --  29  ALT  --   --  22  ALKPHOS  --   --  99  BILITOT  --   --  0.6  GFRNONAA 42* >60 >60  GFRAA 48* >60 >60  ANIONGAP 12 9 6      Hematology Recent Labs  Lab 01/05/18 0850 01/06/18 0359 01/07/18 0510  WBC 28.7* 18.4* 4.9  RBC 3.44* 3.69* 4.32  HGB 9.4* 10.0* 11.9*  HCT 29.0* 31.2* 37.3  MCV 84.3 84.6 86.3  MCH 27.3 27.1 27.5  MCHC 32.4 32.1 31.9  RDW 18.0* 18.3* 18.2*  PLT 486* 396 224    Cardiac Enzymes Recent Labs  Lab 01/04/18 1544 01/04/18 1825 01/05/18 0203 01/05/18 1901  TROPONINI 2.37* 10.60* 40.47* 10.24*    Recent Labs  Lab 01/04/18 1131  TROPIPOC 0.15*     BNPNo results for input(s): BNP, PROBNP in the last 168 hours.   DDimer No results for input(s): DDIMER in the  last 168 hours.   Radiology    Irpicc Placement Left >5 Yrs Inc Img Guide  Result Date: 01/06/2018 CLINICAL DATA:  STEMI, poor venous access EXAM: PICC PLACEMENT WITH ULTRASOUND AND FLUOROSCOPY FLUOROSCOPY TIME:  0.1 minute; 34 uGym2 DAP TECHNIQUE: After written informed consent was obtained, patient was placed in the supine position on angiographic table. Patency of the left basilic vein was confirmed with ultrasound with image documentation. An appropriate skin site was determined. Skin site was marked. Region was prepped using maximum barrier technique including cap and mask, sterile gown, sterile gloves, large sterile sheet, and Chlorhexidine as cutaneous antisepsis. The region was infiltrated locally with 1% lidocaine. Under real-time ultrasound guidance, the left basilic vein was accessed with a 21 gauge micropuncture needle; the needle tip within the vein was confirmed with ultrasound image documentation. Needle exchanged over a 018 guidewire for a peel-away sheath, through which a 5-French double-lumen power injectable PICC trimmed to 35cm was  advanced, positioned with its tip near the cavoatrial junction. Spot chest radiograph confirms appropriate catheter position. Catheter was flushed per protocol and secured externally. The patient tolerated procedure well. COMPLICATIONS: COMPLICATIONS none IMPRESSION: 1. Technically successful five [place] double lumen power injectable PICC placement Electronically Signed   By: Lucrezia Europe M.D.   On: 01/06/2018 16:55   [place] Ekg Site Rite  Result Date: 01/05/2018 If Site Rite image not attached, placement could not be confirmed due to current cardiac rhythm.   Cardiac Studies   01/05/18 ECHO Study Conclusions  - Left ventricle: The cavity size was normal. Wall thickness was normal. The estimated ejection fraction was 55%. There is hypokinesis of the apicalinferior myocardium. Left ventricular diastolic function parameters were normal. - Mitral valve: There was mild regurgitation. - Left atrium: The atrium was mildly dilated. - Right atrium: Central venous pressure (est): 8 mm Hg. - Tricuspid valve: There was trivial regurgitation. - Pulmonary arteries: Systolic pressure could not be accurately estimated. - Pericardium, extracardiac: There was no pericardial effusion.  New apical inf. Hypokinesis of myocardium new from 2016.      Echo from 04/2015  Study Conclusions  - Left ventricle: The cavity size was normal. Systolic function was normal. The estimated ejection fraction was in the range of 55% to 60%. Wall motion was normal; there were no regional wall motion abnormalities. - Mitral valve: There was mild regurgitation. - Atrial septum: No defect or patent foramen ovale was identified. - Pulmonary arteries: PA peak pressure: 40 mm Hg (S).    Patient Profile     52 y.o. female with a hx of diabetes mellitus, diabetic gastroparesis medical noncompliance, depression and admitted 01/04/18 for abd pain and back pain and DKA started on insulin also AKI with Cr 2.37  from 1.3 to 1.7 her usual.  Troponin pk of 40.47 now decreasing.  EKG with inf lat ST depression but similar to prior EKGs.   Assessment & Plan    NSTEMI with troponin 40.47, in setting of DKA, no chest pain + abd pain.  She does have elevated Cr.  Once she is able to take oral meds then proceed with cardiac cath.  Though her N&V is ongoing process.  Board is full today off IV insulin, cath today at 1530   --new apical inf hypokinesis on echo --on IV heparin The patient understands that risks included but are not limited to stroke (1 in 1000), death (1 in 1000), kidney failure [usually temporary] (1 in 500), bleeding (1 in 200), allergic reaction [possibly  serious] (1 in 200).    DKA followed by IM  Complex with gastroparesis possible need for pacemaker --off IV insulin  --glucose from 306 to 219.   AKI  --Now with Cr at 0.95  down from 1.04 yesterday and 2.37 on admit   Non compliance --will need to take meds if stent is needed.   HTN  --now improved 126/69 to 470/76  systolic  --add BB lopressor 12.5 BID  HLD --check lipids and hepatic begin statin.    Hypokalemia rec'ing IV K+ replacement.       For questions or updates, please contact [place] Please consult [email or web address] for contact info under Cardiology/STEMI.      Signed, Cecilie Kicks, NP  01/07/2018, 7:52 AM    Pt seen and examined    I agree with findings of L [place] above     Pt deneis CP   Gets SOB with activity  On exam:   Pt laying flat   Lungs CTA   Cardiac RRR   No S3   Abd benign   Ext without edema   With Hx of DM, SOB  and marked elevattion in troponin (40) recomm L heart cath to define anatomy.   Plan for today    Further f/u based on results.    Dorris Carnes

## 2018-01-07 NOTE — Progress Notes (Signed)
Site area: rt groin fa sheath Site Prior to Removal:  Level 0 Pressure Applied For:  20 minutes  Manual:   yes Patient Status During Pull:  stable Post Pull Site:  Level  0 Post Pull Instructions Given:  yes Post Pull Pulses Present: palpable rt dp Dressing Applied:  Gauze and tegaderm Bedrest begins @ 1750 Comments:

## 2018-01-07 NOTE — Progress Notes (Signed)
ANTICOAGULATION CONSULT NOTE -  Follow Up Consult  Pharmacy Consult for IV heparin Indication: chest pain/ACS  Allergies  Allergen Reactions  . Anesthetics, Amide Nausea And Vomiting  . Penicillins Diarrhea, Nausea And Vomiting and Other (See Comments)    Has patient had a PCN reaction causing immediate rash, facial/tongue/throat swelling, SOB or lightheadedness with hypotension: Yes Has patient had a PCN reaction causing severe rash involving mucus membranes or skin necrosis: No Has patient had a PCN reaction that required hospitalization No Has patient had a PCN reaction occurring within the last 10 years: Yes  If all of the above answers are "NO", then may proceed with Cephalosporin use.   . Buprenorphine Hcl Rash  . Encainide Nausea And Vomiting  . Metoclopramide Other (See Comments)    Dystonia, muscle rigidity.     Patient Measurements: Height: 5\' 1"  (154.9 cm) Weight: 145 lb 11.6 oz (66.1 kg) IBW/kg (Calculated) : 47.8 Heparin Dosing Weight: 68 kg   Vital Signs: Temp: 98.7 F (37.1 C) (07/16 1806) Temp Source: Oral (07/16 1806) BP: 154/87 (07/16 1806) Pulse Rate: 94 (07/16 1806)  Labs: Recent Labs    01/05/18 0203  01/05/18 0850  01/05/18 1440 01/05/18 1901 01/06/18 0030 01/06/18 0359 01/06/18 0753 01/07/18 0510  HGB  --    < > 9.4*  --   --   --   --  10.0*  --  11.9*  HCT  --   --  29.0*  --   --   --   --  31.2*  --  37.3  PLT  --   --  486*  --   --   --   --  396  --  224  HEPARINUNFRC >2.20*  --   --    < >  --  1.45* 0.31  --   --  0.11*  CREATININE 1.67*  --  1.52*  --  1.44*  --   --   --  1.04* 0.95  TROPONINI 40.47*  --   --   --   --  10.24*  --   --   --   --    < > = values in this interval not displayed.    Estimated Creatinine Clearance: 61.6 mL/min (by C-G formula based on SCr of 0.95 mg/dL).   Medical History: Past Medical History:  Diagnosis Date  . Depression   . Diabetes mellitus without complication ([place])   . Essential  hypertension   . Gastroparesis   . GERD (gastroesophageal reflux disease)   . HLD (hyperlipidemia)     Medications:  Scheduled:  . [START ON 01/08/2018] aspirin  81 mg Oral Daily  . [START ON 01/08/2018] atorvastatin  80 mg Oral q1800  . busPIRone  10 mg Oral TID  . Chlorhexidine Gluconate Cloth  6 each Topical Daily  . fluticasone  2 spray Each Nare Daily  . insulin aspart  0-9 Units Subcutaneous Q4H  . insulin detemir  10 Units Subcutaneous Q24H  . metoprolol tartrate  12.5 mg Oral BID  . pyridostigmine  30 mg Oral TID WC  . sodium chloride flush  10-40 mL Intracatheter Q12H  . sodium chloride flush  3 mL Intravenous Q12H    Assessment: Pharmacy is consulted to dose heparin in 52 yo female diagnosed with ACS. No listed anticoagulation on PTA med list. Baseline labs obtained.  Pharmacy asked to resume IV heparin 10 hrs after sheath pull.  Sheath removed at 1750 PM.  CABG consult  pending.  Goal of Therapy:  Heparin level 0.3-0.7 units/ml Monitor platelets by anticoagulation protocol: Yes   Plan:  -Restart IV heparin at rate of 900 units/hr at 0400 tomorrow (7/17) -Check heparin level 6 hrs after gtt starts. -Daily heparin level and CBC. -F/u plans for CABG.  Marguerite Olea, [facility] Clinical Pharmacist Phone [phone]  01/07/2018 6:51 PM

## 2018-01-07 NOTE — Interval H&P Note (Signed)
Cath Lab Visit (complete for each Cath Lab visit)  Clinical Evaluation Leading to the Procedure:   ACS: Yes.    Non-ACS:    Anginal Classification: CCS IV  Anti-ischemic medical therapy: Minimal Therapy (1 class of medications)  Non-Invasive Test Results: No non-invasive testing performed  Prior CABG: No previous CABG      History and Physical Interval Note:  01/07/2018 4:11 PM  [name]  has presented today for surgery, with the diagnosis of ns  The various methods of treatment have been discussed with the patient and family. After consideration of risks, benefits and other options for treatment, the patient has consented to  Procedure(s): LEFT HEART CATH AND CORONARY ANGIOGRAPHY (N/A) as a surgical intervention .  The patient's history has been reviewed, patient examined, no change in status, stable for surgery.  I have reviewed the patient's chart and labs.  Questions were answered to the patient's satisfaction.     Shelva Majestic

## 2018-01-07 NOTE — Progress Notes (Signed)
Patient in holding area pre procedure. Right wrist iv infiltrated. IV catheter removed. Dressing applied heat packs applied with towel between heat pack and skin.

## 2018-01-08 ENCOUNTER — Encounter (HOSPITAL_COMMUNITY): Payer: Self-pay | Admitting: Cardiovascular Disease

## 2018-01-08 DIAGNOSIS — N179 Acute kidney failure, unspecified: Secondary | ICD-10-CM

## 2018-01-08 DIAGNOSIS — F329 Major depressive disorder, single episode, unspecified: Secondary | ICD-10-CM

## 2018-01-08 DIAGNOSIS — E860 Dehydration: Secondary | ICD-10-CM

## 2018-01-08 DIAGNOSIS — F419 Anxiety disorder, unspecified: Secondary | ICD-10-CM

## 2018-01-08 LAB — GLUCOSE, CAPILLARY
GLUCOSE-CAPILLARY: 159 mg/dL — AB (ref 70–99)
GLUCOSE-CAPILLARY: 224 mg/dL — AB (ref 70–99)
GLUCOSE-CAPILLARY: 260 mg/dL — AB (ref 70–99)
GLUCOSE-CAPILLARY: 253 mg/dL — AB (ref 70–99)
Glucose-Capillary: 59 mg/dL — ABNORMAL LOW (ref 70–99)
Glucose-Capillary: 124 mg/dL — ABNORMAL HIGH (ref 70–99)

## 2018-01-08 LAB — BASIC METABOLIC PANEL
ANION GAP: 4 — AB (ref 5–15)
BUN: 8 mg/dL (ref 6–20)
CHLORIDE: 114 mmol/L — AB (ref 98–111)
CO2: 22 mmol/L (ref 22–32)
CREATININE: 0.88 mg/dL (ref 0.44–1.00)
Calcium: 7.3 mg/dL — ABNORMAL LOW (ref 8.9–10.3)
GFR calc non Af Amer: >60 mL/min (ref >60)
Glucose, Bld: 123 mg/dL — ABNORMAL HIGH (ref 70–99)
Potassium: 3.3 mmol/L — ABNORMAL LOW (ref 3.5–5.1)
Sodium: 140 mmol/L (ref 135–145)

## 2018-01-08 LAB — CBC
HEMATOCRIT: 28.5 % — AB (ref 36.0–46.0)
HEMOGLOBIN: 8.5 g/dL — AB (ref 12.0–15.0)
MCH: 26.8 pg (ref 26.0–34.0)
MCHC: 29.8 g/dL — ABNORMAL LOW (ref 30.0–36.0)
MCV: 89.9 fL (ref 78.0–100.0)
Platelets: 227 10*3/uL (ref 150–400)
RBC: 3.17 MIL/uL — AB (ref 3.87–5.11)
RDW: 17.3 % — ABNORMAL HIGH (ref 11.5–15.5)
WBC: 6.3 10*3/uL (ref 4.0–10.5)

## 2018-01-08 LAB — HEPARIN LEVEL (UNFRACTIONATED)
HEPARIN UNFRACTIONATED: 0.21 [IU]/mL — AB (ref 0.30–0.70)
Heparin Unfractionated: <0.1 IU/mL — ABNORMAL LOW (ref 0.30–0.70)

## 2018-01-08 LAB — HEMOGLOBIN A1C
HEMOGLOBIN A1C: 8.6 % — AB (ref 4.8–5.6)
MEAN PLASMA GLUCOSE: 200.12 mg/dL

## 2018-01-08 MED ORDER — DEXTROSE 50 % IV SOLN
25.0000 mL | Freq: Once | INTRAVENOUS | Status: AC
  Administered 2018-01-08: 25 mL via INTRAVENOUS

## 2018-01-08 MED ORDER — INSULIN DETEMIR 100 UNIT/ML ~~LOC~~ SOLN
5.0000 [IU] | Freq: Every day | SUBCUTANEOUS | Status: DC
  Administered 2018-01-08: 5 [IU] via SUBCUTANEOUS
  Filled 2018-01-08: qty 0.05

## 2018-01-08 MED ORDER — ACETAMINOPHEN 650 MG RE SUPP: 650.0000 mg | Freq: Four times a day (QID) | RECTAL | Status: DC | PRN

## 2018-01-08 MED ORDER — INSULIN ASPART 100 UNIT/ML ~~LOC~~ SOLN
0.0000 [IU] | Freq: Every day | SUBCUTANEOUS | Status: DC
Start: 2018-01-08 — End: 2018-01-14
  Administered 2018-01-08 – 2018-01-09 (×2): 3 [IU] via SUBCUTANEOUS
  Administered 2018-01-10: 2 [IU] via SUBCUTANEOUS
  Administered 2018-01-11: 3 [IU] via SUBCUTANEOUS
  Administered 2018-01-13: 2 [IU] via SUBCUTANEOUS

## 2018-01-08 MED ORDER — POTASSIUM CHLORIDE CRYS ER 20 MEQ PO TBCR
60.0000 meq | EXTENDED_RELEASE_TABLET | Freq: Once | ORAL | Status: AC
  Administered 2018-01-08: 60 meq via ORAL
  Filled 2018-01-08: qty 3

## 2018-01-08 MED ORDER — INSULIN ASPART 100 UNIT/ML ~~LOC~~ SOLN
0.0000 [IU] | Freq: Three times a day (TID) | SUBCUTANEOUS | Status: DC
  Administered 2018-01-08: 5 [IU] via SUBCUTANEOUS
  Administered 2018-01-08: 3 [IU] via SUBCUTANEOUS
  Administered 2018-01-09 (×2): 2 [IU] via SUBCUTANEOUS
  Administered 2018-01-09: 3 [IU] via SUBCUTANEOUS
  Administered 2018-01-10: 1 [IU] via SUBCUTANEOUS
  Administered 2018-01-10: 3 [IU] via SUBCUTANEOUS
  Administered 2018-01-10: 2 [IU] via SUBCUTANEOUS
  Administered 2018-01-11: 3 [IU] via SUBCUTANEOUS
  Administered 2018-01-11: 1 [IU] via SUBCUTANEOUS
  Administered 2018-01-11 – 2018-01-12 (×2): 3 [IU] via SUBCUTANEOUS
  Administered 2018-01-12: 2 [IU] via SUBCUTANEOUS
  Administered 2018-01-13: 3 [IU] via SUBCUTANEOUS
  Administered 2018-01-13 (×2): 1 [IU] via SUBCUTANEOUS

## 2018-01-08 MED ORDER — DEXTROSE 50 % IV SOLN
INTRAVENOUS | Status: AC
  Filled 2018-01-08: qty 50

## 2018-01-08 MED ORDER — FUROSEMIDE 10 MG/ML IJ SOLN
80.0000 mg | Freq: Once | INTRAMUSCULAR | Status: AC
  Administered 2018-01-08: 80 mg via INTRAVENOUS

## 2018-01-08 MED ORDER — METOPROLOL TARTRATE 25 MG PO TABS
25.0000 mg | ORAL_TABLET | Freq: Two times a day (BID) | ORAL | Status: DC
  Administered 2018-01-08 – 2018-01-13 (×11): 25 mg via ORAL
  Filled 2018-01-08 (×11): qty 1

## 2018-01-08 MED ORDER — FUROSEMIDE 10 MG/ML IJ SOLN
INTRAMUSCULAR | Status: AC
  Filled 2018-01-08: qty 8

## 2018-01-08 MED ORDER — ACETAMINOPHEN 325 MG PO TABS: 650.0000 mg | ORAL_TABLET | Freq: Four times a day (QID) | ORAL | Status: DC | PRN

## 2018-01-08 MED ORDER — HEPARIN BOLUS VIA INFUSION
1000.0000 [IU] | Freq: Once | INTRAVENOUS | Status: AC
  Administered 2018-01-08: 1000 [IU] via INTRAVENOUS
  Filled 2018-01-08: qty 1000

## 2018-01-08 NOTE — Progress Notes (Signed)
Progress Note  Patient Name: [name] Date of Encounter: 01/08/2018  Primary Cardiologist: Mertie Moores, MD   Subjective   No CP   SOB earlier  Inpatient Medications    Scheduled Meds: . dextrose      . aspirin  81 mg Oral Daily  . atorvastatin  80 mg Oral q1800  . busPIRone  10 mg Oral TID  . Chlorhexidine Gluconate Cloth  6 each Topical Daily  . fluticasone  2 spray Each Nare Daily  . insulin aspart  0-9 Units Subcutaneous Q4H  . insulin detemir  10 Units Subcutaneous Q24H  . metoprolol tartrate  12.5 mg Oral BID  . pyridostigmine  30 mg Oral TID WC  . sodium chloride flush  10-40 mL Intracatheter Q12H  . sodium chloride flush  3 mL Intravenous Q12H   Continuous Infusions: . sodium chloride 125 mL/hr at 01/07/18 2207  . sodium chloride    . dextrose 5 % and 0.45% NaCl    . heparin 900 Units/hr (01/08/18 0532)   PRN Meds: sodium chloride, acetaminophen (TYLENOL) oral liquid 160 mg/5 mL **OR** acetaminophen, diazepam, ondansetron (ZOFRAN) IV, ondansetron **OR** [DISCONTINUED] ondansetron (ZOFRAN) IV, promethazine, sodium chloride flush, sodium chloride flush   Vital Signs    Vitals:   01/08/18 0200 01/08/18 0400 01/08/18 0429 01/08/18 0800  BP: 131/77 132/76  140/75  Pulse: 74 80  92  Resp: (!) 30 20  (!) 21  Temp:  98.5 F (36.9 C) 98.5 F (36.9 C) 98 F (36.7 C)  TempSrc:  Oral Oral Tympanic  SpO2: 100% 100%  97%  Weight:      Height:        Intake/Output Summary (Last 24 hours) at 01/08/2018 0849 Last data filed at 01/08/2018 0100 Gross per 24 hour  Intake 724.68 ml  Output 450 ml  Net 274.68 ml   Filed Weights   01/05/18 2106  Weight: 66.1 kg (145 lb 11.6 oz)    Telemetry     SR  - Personally Reviewed  ECG      Physical Exam   GEN: No acute distress.   Neck:  JVP increased   Cardiac: RRR, no murmurs, rubs, or gallops.  Respiratory: Clear to auscultation bilaterally. GI: Soft, nontender, non-distended  MS: No edema;   R groin  soft   No bleeding   No murmurs  No deformity. Neuro:  Nonfocal  Psych: Normal affect   Labs    Chemistry Recent Labs  Lab 01/06/18 0753 01/07/18 0510 01/08/18 0500  NA 144 139 140  K 4.2 3.2* 3.3*  CL 117* 109 114*  CO2 18* 24 22  GLUCOSE 59* 221* 123*  BUN 12 13 8   CREATININE 1.04* 0.95 0.88  CALCIUM 8.6* 7.6* 7.3*  PROT  --  5.2*  --   ALBUMIN  --  2.4*  --   AST  --  29  --   ALT  --  22  --   ALKPHOS  --  99  --   BILITOT  --  0.6  --   GFRNONAA >60 >60 >60  GFRAA >60 >60 >60  ANIONGAP 9 6 4*     Hematology Recent Labs  Lab 01/06/18 0359 01/07/18 0510 01/08/18 0500  WBC 18.4* 4.9 6.3  RBC 3.69* 4.32 3.17*  HGB 10.0* 11.9* 8.5*  HCT 31.2* 37.3 28.5*  MCV 84.6 86.3 89.9  MCH 27.1 27.5 26.8  MCHC 32.1 31.9 29.8*  RDW 18.3* 18.2* 17.3*  PLT 396  224 227    Cardiac Enzymes Recent Labs  Lab 01/04/18 1544 01/04/18 1825 01/05/18 0203 01/05/18 1901  TROPONINI 2.37* 10.60* 40.47* 10.24*    Recent Labs  Lab 01/04/18 1131  TROPIPOC 0.15*     BNPNo results for input(s): BNP, PROBNP in the last 168 hours.   DDimer No results for input(s): DDIMER in the last 168 hours.   Radiology    Dg Chest Port 1 View  Result Date: 01/07/2018 CLINICAL DATA:  PICC placement EXAM: PORTABLE CHEST 1 VIEW COMPARISON:  01/04/2018 FINDINGS: Left PICC line in place with the tip in the SVC. Patchy airspace disease bilaterally, right greater than left. Small right effusion. Heart is normal size. IMPRESSION: Worsening bilateral airspace disease, right greater than left concerning for multifocal pneumonia. Small right effusion. Left PICC line tip in the lower SVC. Electronically Signed   By: Rolm Baptise M.D.   On: 01/07/2018 10:11   Irpicc Placement Left >5 Yrs Inc Img Guide  Result Date: 01/06/2018 CLINICAL DATA:  STEMI, poor venous access EXAM: PICC PLACEMENT WITH ULTRASOUND AND FLUOROSCOPY FLUOROSCOPY TIME:  0.1 minute; 34 uGym2 DAP TECHNIQUE: After written informed consent  was obtained, patient was placed in the supine position on angiographic table. Patency of the left basilic vein was confirmed with ultrasound with image documentation. An appropriate skin site was determined. Skin site was marked. Region was prepped using maximum barrier technique including cap and mask, sterile gown, sterile gloves, large sterile sheet, and Chlorhexidine as cutaneous antisepsis. The region was infiltrated locally with 1% lidocaine. Under real-time ultrasound guidance, the left basilic vein was accessed with a 21 gauge micropuncture needle; the needle tip within the vein was confirmed with ultrasound image documentation. Needle exchanged over a 018 guidewire for a peel-away sheath, through which a 5-French double-lumen power injectable PICC trimmed to 35cm was advanced, positioned with its tip near the cavoatrial junction. Spot chest radiograph confirms appropriate catheter position. Catheter was flushed per protocol and secured externally. The patient tolerated procedure well. COMPLICATIONS: COMPLICATIONS none IMPRESSION: 1. Technically successful five [place] double lumen power injectable PICC placement Electronically Signed   By: Lucrezia Europe M.D.   On: 01/06/2018 16:55    Cardiac Studies  Cardiac Cath 01/07/18    Ost Ramus to Ramus lesion is 70% stenosed.  Ost LAD lesion is 40% stenosed.  Prox LAD lesion is 50% stenosed.  Prox LAD to Mid LAD lesion is 70% stenosed.  Prox Cx to Mid Cx lesion is 70% stenosed.  Mid Cx to Dist Cx lesion is 60% stenosed.  Mid RCA lesion is 85% stenosed.  Prox RCA lesion is 20% stenosed.  Dist RCA lesion is 50% stenosed.  RPDA lesion is 70% stenosed.   Multi-vessel CAD in this diabetic female with diffuse disease and diabetic appearing vessels.  There is smooth 40% ostial LAD stenosis, diffuse 50% proximal LAD stenosis, significant eccentric calcification in the mid LAD after the diagonal vessel with narrowing of at least 70% and diffuse distal  LAD narrowing; diffusely narrowed proximal to mid ramus intermediate vessel; large left circumflex coronary artery with 70% followed by 60% mid AV groove stenosis after the takeoff of the first marginal vessel; and small caliber RCA with diffuse disease with 20% proximal RCA narrowing,  focal 85% mid stenosis, diffuse 50% narrowing before the PDA takeoff, 70% mid PDA stenosis.  Moderately severe global LV dysfunction with an ejection fraction of 30 to 35%.  LVEDP 23 mm.  RECOMMENDATION: Angiograms will be reviewed with colleagues.  Recommend surgical consultation for consideration of CABG revascularization in this 52 year old diabetic female with a 41-year history of diabetes mellitus.  The patient's tightest lesion is her mid RCA but this would not account for her troponin of 40.47.  Increased medical therapy.  Hydrate post catheterization.  Will reinstitute heparin therapy for anticoagulation 10 hours post sheath removal.   Patient Profile     52 y.o. female   Assessment & Plan    1  CAD   Cath as noted above    Currently on heparin   Surgery service has been called to review anatomy    Echo earlier this admit LVEF 55% with hypokinesis of the inferior and apical walls      Will give IV lasix x 1   Strict I/O   Cut back on IV fluids    2  HTN   BP is elevated at times   WIll increase metoprolol to 25 bid    3  HL  LDL 50   Currently on lipitor 80     For questions or updates, please contact [facility] Please consult [email or web address] for contact info under Cardiology/STEMI.      Signed, Dorris Carnes, MD  01/08/2018, 8:49 AM

## 2018-01-08 NOTE — Progress Notes (Signed)
Pt c/o abdominal cramps when she voids. Pt voiding large amounts, purewick in place due to high dose of lasix and pt weakness/fatigue.  Attempted to give pt tylenol for discomfort but she vomited before taking it. Pt vomited approx 50cc stringy-mucous  phlegm-white/pale yellow color.  Pt given zofran IV, cool wet cloth.   Pt c/o cramps when voiding, urine pale yellow and clear. Abdomen same size and pt states " same size" as her normal when assessed this am and at this time.  R groin level 0, no bruising, no bleeding, no hematoma, same as this morning--pt had generalized edema all over this am. Vitals WNL.

## 2018-01-08 NOTE — Progress Notes (Addendum)
ANTICOAGULATION CONSULT NOTE - Follow Up Consult  Pharmacy Consult for heparin Indication: CAD awaiting TCTS consult   Labs: Recent Labs    01/06/18 0359 01/06/18 0753 01/07/18 0510 01/08/18 0500 01/08/18 1154 01/08/18 2148  HGB 10.0*  --  11.9* 8.5*  --   --   HCT 31.2*  --  37.3 28.5*  --   --   PLT 396  --  224 227  --   --   HEPARINUNFRC  --   --  0.11*  --  0.21* <0.10*  CREATININE  --  1.04* 0.95 0.88  --   --     Assessment: 52yo female now undetectable on heparin after rate increase; RN notes no gtt issues though gtt has to be paused for lab draws due to restricted access which could affect results, though would not expect undetectable level with short pause.  Goal of Therapy:  Heparin level 0.3-0.7 units/ml   Plan:  Will give small heparin bolus of 1000 units and increase heparin gtt by 2-3 units/kg/hr to 1200 units/hr and check level in 6 hours.    Wynona Neat, PharmD, BCPS  01/08/2018,11:47 PM   Addendum: Next level above goal (1.26), likely labs are inconsistent w/ having to pause heparin to draw labs.  Will decrease heparin gtt back to 1050 units/hr and check level in 6hr.  VB 01/09/2018 6:26 AM

## 2018-01-08 NOTE — Progress Notes (Signed)
Hypoglycemic Event  CBG: 59  Treatment: D50 IV 25 mL  Symptoms: Shaky  Follow-up CBG: Time 0909 CBG Result:124  Possible Reasons for Event: Inadequate meal intake  Comments/MD notified:    Will notify.   At 0810  Pt given apple juice by staff member and carbs to eat (graham crackers, pt had few sips of juice and no crackers.  At 0839    Ended up giving 1/2 amp dextrose IVP.  Pt sipped little more juice.  Will recheck glucose after dextrose.   Glucose rechecked at 0909, up to 124.  Encouraged pt to eat. Pt not wanting to eat at this time.   Leron Croak, [place]

## 2018-01-08 NOTE — Progress Notes (Signed)
Results for ARNESHIA, ADE (MRN [id number]) as of 01/08/2018 09:48  Ref. Range 01/07/2018 19:57 01/07/2018 23:20 01/08/2018 04:32 01/08/2018 08:08 01/08/2018 09:09  Glucose-Capillary Latest Ref Range: 70 - 99 mg/dL 188 (H) 282 (H) 159 (H) 59 (L) 124 (H)  Noted that blood sugar this am was 59 mg/dl. Recommend changing Novolog correction scale to TID & HS if patient is eating.  Noted that Levemir was not given yesterday per [facility]. May need to decrease Levemir dose if blood sugars continue to be less than 70 mg/dl.   Harvel Ricks RN BSN CDE Diabetes Coordinator Pager: [phone]  8am-5pm

## 2018-01-08 NOTE — Progress Notes (Signed)
Pt vomited again approx 100cc stringy- phlegm like emesis with food particles. Pt c/o nausea, zofran already on board, offered phenergan but pt declined, states "it gives me the runs".

## 2018-01-08 NOTE — Progress Notes (Signed)
Pt resting with eyes closed, breathing normal with sats 98% with O2 on, RR 22 (pt pulls O2 off at times--as CM Samantha reported to RN that pt had her O2 off and pt was having increased work of breathing, sats in lower 80's, CM replaced pt's O2 back on).  RN assessed pt following this, pt sats as noted above and resp normal, and relaxed with eyes closed.  Pt denies nausea at this time.

## 2018-01-08 NOTE — Progress Notes (Signed)
[facility]                                                                              Patient Demographics  [name], is a 52 y.o. female, DOB - Oct 13, 1965, ZHG:[id number]  Admit date - 01/04/2018   Admitting Physician Cristal Ford, DO  Outpatient Primary MD for the patient is [name], MD  Outpatient specialists:   LOS - 4  days   Medical records reviewed and are as summarized below:    Chief Complaint  Patient presents with  . Hyperglycemia       Brief summary  [name] is a 52 y.o. femalewith a medical history ofdiabetes mellitus, medical noncompliance, depression. She presented with DKA, started on IV insulin and now found to have an NSTEMI. Started on heparin drip. DKA resolved.  Assessment & Plan    Principal Problem:   DKA (diabetic ketoacidoses) ([place]) with underlying history of uncontrolled type 1 diabetes mellitus -Patient has recurrent admissions for DKA, currently resolved, patient has been transitioned to subcutaneous insulin -Hypoglycemia this morning, with blood sugar of 59.  Changed sliding scale insulin to sensitive with meals, decrease Levemir to 5 units at bedtime.  -DC IV fluids   Active Problems:   Non-ST elevation (NSTEMI) myocardial infarction ([place]), troponin peaked at 40.47 -Currently no chest pain.  Underwent cardiac cath on 7/16 which showed multivessel severe coronary disease, recommended surgical consultation for CABG -2D echo showed EF of 55% with hypokinesis of inferior and apical walls -Continue IV heparin     Anxiety and depression Continue BuSpar    AKI (acute kidney injury) ([place]) - admitted with creatinine of 2.1, improved to 0.8 -Creatinine currently normalized, DC IV fluids  Diarrhea - C. difficile panel negative   essential hypertension -Added metoprolol to 25 mg twice a day  Lactic acidosis with sepsis ruled out, - SIRS -No source of infection, likely secondary to DKA and  dehydration   Code Status full code DVT Prophylaxis: Heparin drip Family Communication: Discussed in detail with the patient, all imaging results, lab results explained to the patient     Disposition Plan: Awaiting further recommendations from CT VS  Time Spent in minutes   25 minutes minutes  Procedures:  Cardiac cath on 7/16  Consultants:   Cardiology  Antimicrobials:      Medications  Scheduled Meds: . aspirin  81 mg Oral Daily  . atorvastatin  80 mg Oral q1800  . busPIRone  10 mg Oral TID  . Chlorhexidine Gluconate Cloth  6 each Topical Daily  . fluticasone  2 spray Each Nare Daily  . insulin aspart  0-5 Units Subcutaneous QHS  . insulin aspart  0-9 Units Subcutaneous TID WC  . insulin detemir  5 Units Subcutaneous QHS  . metoprolol tartrate  25 mg Oral BID  . potassium chloride  60 mEq Oral Once  . pyridostigmine  30 mg Oral TID WC  . sodium chloride flush  10-40 mL Intracatheter Q12H  . sodium chloride flush  3 mL Intravenous Q12H   Continuous Infusions: . sodium chloride    . dextrose 5 % and 0.45% NaCl    .  heparin 900 Units/hr (01/08/18 0532)   PRN Meds:.sodium chloride, acetaminophen (TYLENOL) oral liquid 160 mg/5 mL **OR** acetaminophen, diazepam, ondansetron (ZOFRAN) IV, ondansetron **OR** [DISCONTINUED] ondansetron (ZOFRAN) IV, promethazine, sodium chloride flush, sodium chloride flush   Antibiotics   Anti-infectives (From admission, onward)   None        Subjective:   [name] was seen and examined today.  Just not feeling too good today, did not sleep well, blood sugar low this morning. Patient denies chest pain, shortness of breath, abdominal pain, N/V/D/C, new weakness, numbess, tingling. No acute events overnight.    Objective:   Vitals:   01/08/18 0400 01/08/18 0429 01/08/18 0800 01/08/18 0839  BP: 132/76  140/75   Pulse: 80  92 92  Resp: 20  (!) 21 (!) 25  Temp: 98.5 F (36.9 C) 98.5 F (36.9 C) 98 F (36.7 C)   TempSrc:  Oral Oral Tympanic   SpO2: 100%  97% 90%  Weight:      Height:        Intake/Output Summary (Last 24 hours) at 01/08/2018 1105 Last data filed at 01/08/2018 0100 Gross per 24 hour  Intake 724.68 ml  Output 450 ml  Net 274.68 ml     Wt Readings from Last 3 Encounters:  01/05/18 66.1 kg (145 lb 11.6 oz)  12/23/17 68 kg (150 lb)  12/10/17 65 kg (143 lb 4.8 oz)     Exam  General: Alert and oriented x 3, NAD  Eyes:   HEENT:  Atraumatic, normocephalic  Cardiovascular: S1 S2 auscultated, no rubs, murmurs or gallops. Regular rate and rhythm.  Respiratory: Clear to auscultation bilaterally, no wheezing, rales or rhonchi  Gastrointestinal: Soft, nontender, nondistended, + bowel sounds  Ext: no pedal edema bilaterally  Neuro: no new deficits  Musculoskeletal: No digital cyanosis, clubbing  Skin: No rashes  Psych: Normal affect and demeanor, alert and oriented x3    Data Reviewed:  I have personally reviewed following labs and imaging studies  Micro Results Recent Results (from the past 240 hour(s))  MRSA PCR Screening     Status: None   Collection Time: 01/04/18  3:41 PM  Result Value Ref Range Status   MRSA by PCR NEGATIVE NEGATIVE Final    Comment:        The GeneXpert MRSA Assay (FDA approved for NASAL specimens only), is one component of a comprehensive MRSA colonization surveillance program. It is not intended to diagnose MRSA infection nor to guide or monitor treatment for MRSA infections. Performed at [facility], [place] [address], [place], [place] [address]   C difficile quick scan w PCR reflex     Status: None   Collection Time: 01/06/18  6:37 AM  Result Value Ref Range Status   C Diff antigen NEGATIVE NEGATIVE Final   C Diff toxin NEGATIVE NEGATIVE Final   C Diff interpretation No C. difficile detected.  Final    Comment: Performed at [facility], [place] [address], [place], [place] [address]    Radiology  Reports Dg Chest 1 View  Result Date: 12/10/2017 CLINICAL DATA:  52 year old female with abdominal pain and vomiting. Diabetic ketoacidosis. Shortness of breath. EXAM: CHEST  1 VIEW COMPARISON:  Chest and abdominal radiographs 11/22/2017 and earlier. FINDINGS: Portable AP semi upright view at 1811 hours. Lung volumes and mediastinal contours remain normal. Allowing for portable technique the lungs are clear. Visualized tracheal air column is within normal limits. Negative visible bowel gas pattern. No osseous abnormality  identified. IMPRESSION: Negative.  No acute cardiopulmonary abnormality. Electronically Signed   By: Genevie Ann M.D.   On: 12/10/2017 18:34   Dg Abd 1 View  Result Date: 01/04/2018 CLINICAL DATA:  Onset of abdominal pain. EXAM: ABDOMEN - 1 VIEW COMPARISON:  12/23/2017 FINDINGS: Bowel gas pattern is nonobstructive. Again noted is gaseous distension of the stomach, raising the question of aerophagia. Surgical clips are noted in the RIGHT UPPER QUADRANT the abdomen. There is no evidence for free intraperitoneal air. IMPRESSION: 1. No evidence for acute abnormality. 2. Persistent gaseous distension of the stomach. Question of aerophagia. Electronically Signed   By: Nolon Nations M.D.   On: 01/04/2018 16:50   Dg Chest Port 1 View  Result Date: 01/07/2018 CLINICAL DATA:  PICC placement EXAM: PORTABLE CHEST 1 VIEW COMPARISON:  01/04/2018 FINDINGS: Left PICC line in place with the tip in the SVC. Patchy airspace disease bilaterally, right greater than left. Small right effusion. Heart is normal size. IMPRESSION: Worsening bilateral airspace disease, right greater than left concerning for multifocal pneumonia. Small right effusion. Left PICC line tip in the lower SVC. Electronically Signed   By: Rolm Baptise M.D.   On: 01/07/2018 10:11   Dg Chest Portable 1 View  Result Date: 01/04/2018 CLINICAL DATA:  History of diabetes.  Tachypnea. EXAM: PORTABLE CHEST 1 VIEW COMPARISON:  December 23, 2017  FINDINGS: The heart size and mediastinal contours are within normal limits. Both lungs are clear. The visualized skeletal structures are unremarkable. IMPRESSION: No active disease. Electronically Signed   By: Dorise Bullion III M.D   On: 01/04/2018 11:39   Dg Abd Acute W/chest  Result Date: 12/23/2017 CLINICAL DATA:  Abdominal and back pain. Shortness of breath. History of gastroparesis. EXAM: DG ABDOMEN ACUTE W/ 1V CHEST COMPARISON:  Single-view of the chest 12/10/2017. Plain films chest and abdomen 11/22/2017. FINDINGS: Single-view of the chest demonstrates clear lungs and normal heart size. No pneumothorax or pleural effusion. Two views of the abdomen show no free intraperitoneal air. There is gaseous distention of the stomach. Small and large bowel are unremarkable. No unexpected abdominal calcification or focal bony abnormality. IMPRESSION: Gaseous distention of the stomach may be related to the patient's history of diabetic gastroparesis. The examination is otherwise negative. Electronically Signed   By: [name] Rise M.D.   On: 12/23/2017 19:01   Dg Abd Portable 1v  Result Date: 12/13/2017 CLINICAL DATA:  Nausea.  History of diabetes.  GERD, hypertension. EXAM: PORTABLE ABDOMEN - 1 VIEW COMPARISON:  11/22/2017 FINDINGS: Bowel gas pattern is nonobstructive. Moderate stool burden. Surgical clips are seen in the RIGHT UPPER QUADRANT. No organomegaly or abnormal calcifications. Visualized osseous structures have a normal appearance. IMPRESSION: Negative. Electronically Signed   By: Nolon Nations M.D.   On: 12/13/2017 08:37   Irpicc Placement Left >5 Yrs Inc Img Guide  Result Date: 01/06/2018 CLINICAL DATA:  STEMI, poor venous access EXAM: PICC PLACEMENT WITH ULTRASOUND AND FLUOROSCOPY FLUOROSCOPY TIME:  0.1 minute; 34 uGym2 DAP TECHNIQUE: After written informed consent was obtained, patient was placed in the supine position on angiographic table. Patency of the left basilic vein was confirmed  with ultrasound with image documentation. An appropriate skin site was determined. Skin site was marked. Region was prepped using maximum barrier technique including cap and mask, sterile gown, sterile gloves, large sterile sheet, and Chlorhexidine as cutaneous antisepsis. The region was infiltrated locally with 1% lidocaine. Under real-time ultrasound guidance, the left basilic vein was accessed with a 21 gauge micropuncture  needle; the needle tip within the vein was confirmed with ultrasound image documentation. Needle exchanged over a 018 guidewire for a peel-away sheath, through which a 5-French double-lumen power injectable PICC trimmed to 35cm was advanced, positioned with its tip near the cavoatrial junction. Spot chest radiograph confirms appropriate catheter position. Catheter was flushed per protocol and secured externally. The patient tolerated procedure well. COMPLICATIONS: COMPLICATIONS none IMPRESSION: 1. Technically successful five [place] double lumen power injectable PICC placement Electronically Signed   By: Lucrezia Europe M.D.   On: 01/06/2018 16:55   Irpicc Placement Left >5 Yrs Inc Img Guide  Result Date: 12/23/2017 INDICATION: 52 year old female with a history of DKA EXAM: PICC LINE PLACEMENT WITH ULTRASOUND AND FLUOROSCOPIC GUIDANCE MEDICATIONS: None ANESTHESIA/SEDATION: None FLUOROSCOPY TIME:  Fluoroscopy Time: 0 minutes 42 seconds (1.9 mGy). COMPLICATIONS: None PROCEDURE: Informed written consent was obtained from the patient after a thorough discussion of the procedural risks, benefits and alternatives. All questions were addressed. Maximal Sterile Barrier Technique was utilized including caps, mask, sterile gowns, sterile gloves, sterile drape, hand hygiene and skin antiseptic. A timeout was performed prior to the initiation of the procedure. Patient was position in the supine position on the fluoroscopy table with the right arm abducted 90 degrees. Ultrasound survey of the upper extremity  was performed with images stored and sent to PACs. The left brachial vein was selected for access. Once the patient was prepped and draped in the usual sterile fashion, the skin and subcutaneous tissues were generously infiltrated with 1% lidocaine for local anesthesia. A micropuncture access kit was then used to access the targeted vein. Wire was passed centrally, confirmed to be within the venous system under fluoroscopy. A small stab incision was made with an 11 blade scalpel and the sheath was then placed over the wire. Estimated length of the catheter was then performed with the indwelling wire. Catheter was amputated at 41 cm length and placed with coaxial wire through the peel-away. Double-lumen, power injectable PICC in the left brachial vein. Tip confirmed at the cavoatrial junction, and the catheter is ready for use. Stat lock was placed. Patient tolerated the procedure well and remained hemodynamically stable throughout. No complications were encountered and no significant blood loss was encountered. IMPRESSION: Status post left upper extremity PICC. Signed, Dulcy Fanny. Dellia Nims, RPVI [facility] Radiology PLAN: Anticipate future PICC placement to be difficult, with limited venous access and small caliber veins. Electronically Signed   By: Corrie Mckusick D.O.   On: 12/23/2017 17:33   Ir Picc Placement Right >5 Yrs Inc Img Guide  Result Date: 12/13/2017 CLINICAL DATA:  Diabetic ketoacidosis, poor peripheral IV access EXAM: PICC PLACEMENT WITH ULTRASOUND AND FLUOROSCOPY FLUOROSCOPY TIME:  1.3 minutes; 196 uGym2 DAP TECHNIQUE: After written informed consent was obtained, patient was placed in the supine position on angiographic table. Patency of the right brachial vein was confirmed with ultrasound with image documentation. An appropriate skin site was determined. Skin site was marked. Region was prepped using maximum barrier technique including cap and  mask, sterile gown, sterile gloves, large sterile sheet, and Chlorhexidine as cutaneous antisepsis. The region was infiltrated locally with 1% lidocaine. Under real-time ultrasound guidance, the right brachial vein was accessed with a 21 gauge micropuncture needle; the needle tip within the vein was confirmed with ultrasound image documentation. Needle exchanged over a 018 guidewire for a peel-away sheath, through which a 5-French double-lumen power injectable PICC trimmed to 30cm was advanced, positioned with its tip near  the cavoatrial junction. There was focal resistance in the central subclavian vein, overcome by using 2 parallel 018 guidewires. Spot chest radiograph confirms appropriate catheter position. Catheter was flushed per protocol and secured externally. The patient tolerated procedure well. COMPLICATIONS: COMPLICATIONS none IMPRESSION: 1. Technically successful five [place] double lumen power injectable PICC placement Electronically Signed   By: Lucrezia Europe M.D.   On: 12/13/2017 16:49   [place] Ekg Site Rite  Result Date: 01/05/2018 If Site Rite image not attached, placement could not be confirmed due to current cardiac rhythm.   Lab Data:  CBC: Recent Labs  Lab 01/04/18 1119 01/04/18 1135 01/05/18 0850 01/06/18 0359 01/07/18 0510 01/08/18 0500  WBC 23.2*  --  28.7* 18.4* 4.9 6.3  HGB 10.2* 11.6* 9.4* 10.0* 11.9* 8.5*  HCT 34.7* 34.0* 29.0* 31.2* 37.3 28.5*  MCV 94.8  --  84.3 84.6 86.3 89.9  PLT 566*  --  486* 396 224 945   Basic Metabolic Panel: Recent Labs  Lab 01/05/18 0850 01/05/18 1440 01/06/18 0753 01/07/18 0510 01/08/18 0500  NA 146* 146* 144 139 140  K 3.6 4.4 4.2 3.2* 3.3*  CL 117* 117* 117* 109 114*  CO2 16* 17* 18* 24 22  GLUCOSE 177* 173* 59* 221* 123*  BUN 22* 19 12 13 8   CREATININE 1.52* 1.44* 1.04* 0.95 0.88  CALCIUM 8.3* 8.4* 8.6* 7.6* 7.3*   GFR: Estimated Creatinine Clearance: 66.5 mL/min (by C-G formula based on SCr of 0.88 mg/dL). Liver Function  Tests: Recent Labs  Lab 01/07/18 0510  AST 29  ALT 22  ALKPHOS 99  BILITOT 0.6  PROT 5.2*  ALBUMIN 2.4*   No results for input(s): LIPASE, AMYLASE in the last 168 hours. No results for input(s): AMMONIA in the last 168 hours. Coagulation Profile: Recent Labs  Lab 01/04/18 1825  INR 1.27   Cardiac Enzymes: Recent Labs  Lab 01/04/18 1544 01/04/18 1825 01/05/18 0203 01/05/18 1901  TROPONINI 2.37* 10.60* 40.47* 10.24*   BNP (last 3 results) No results for input(s): PROBNP in the last 8760 hours. HbA1C: No results for input(s): HGBA1C in the last 72 hours. CBG: Recent Labs  Lab 01/07/18 1957 01/07/18 2320 01/08/18 0432 01/08/18 0808 01/08/18 0909  GLUCAP 188* 282* 159* 59* 124*   Lipid Profile: Recent Labs    01/07/18 0510  CHOL 121  HDL 47  LDLCALC 54  TRIG 101  CHOLHDL 2.6   Thyroid Function Tests: No results for input(s): TSH, T4TOTAL, FREET4, T3FREE, THYROIDAB in the last 72 hours. Anemia Panel: No results for input(s): VITAMINB12, FOLATE, FERRITIN, TIBC, IRON, RETICCTPCT in the last 72 hours. Urine analysis:    Component Value Date/Time   COLORURINE YELLOW 01/06/2018 2008   APPEARANCEUR CLEAR 01/06/2018 2008   LABSPEC 1.015 01/06/2018 2008   PHURINE 6.0 01/06/2018 2008   GLUCOSEU >=500 (A) 01/06/2018 2008   HGBUR NEGATIVE 01/06/2018 2008   BILIRUBINUR NEGATIVE 01/06/2018 2008   BILIRUBINUR neg 01/17/2017 1533   KETONESUR 5 (A) 01/06/2018 2008   PROTEINUR 30 (A) 01/06/2018 2008   UROBILINOGEN 0.2 01/17/2017 1533   UROBILINOGEN 0.2 04/27/2015 1608   NITRITE NEGATIVE 01/06/2018 2008   LEUKOCYTESUR NEGATIVE 01/06/2018 2008     Ripudeep Rai M.D. [facility] 01/08/2018, 11:05 AM  Pager: [phone] Between 7am to 7pm - call Pager - [phone]  After 7pm go to [email or web address] - password [email or web address]  Call night coverage person covering after 7pm

## 2018-01-08 NOTE — Progress Notes (Signed)
ANTICOAGULATION CONSULT NOTE -  Follow Up Consult  Pharmacy Consult for IV heparin Indication: chest pain/ACS  Allergies  Allergen Reactions  . Anesthetics, Amide Nausea And Vomiting  . Penicillins Diarrhea, Nausea And Vomiting and Other (See Comments)    Has patient had a PCN reaction causing immediate rash, facial/tongue/throat swelling, SOB or lightheadedness with hypotension: Yes Has patient had a PCN reaction causing severe rash involving mucus membranes or skin necrosis: No Has patient had a PCN reaction that required hospitalization No Has patient had a PCN reaction occurring within the last 10 years: Yes  If all of the above answers are "NO", then may proceed with Cephalosporin use.   . Buprenorphine Hcl Rash  . Encainide Nausea And Vomiting  . Metoclopramide Other (See Comments)    Dystonia, muscle rigidity.     Patient Measurements: Height: 5\' 1"  (154.9 cm) Weight: 145 lb 11.6 oz (66.1 kg) IBW/kg (Calculated) : 47.8 Heparin Dosing Weight: 68 kg   Vital Signs: Temp: 98 F (36.7 C) (07/17 0800) Temp Source: Tympanic (07/17 0800) BP: 140/75 (07/17 0800) Pulse Rate: 92 (07/17 0839)  Labs: Recent Labs    01/05/18 1901 01/06/18 0030  01/06/18 0359 01/06/18 0753 01/07/18 0510 01/08/18 0500 01/08/18 1154  HGB  --   --    < > 10.0*  --  11.9* 8.5*  --   HCT  --   --   --  31.2*  --  37.3 28.5*  --   PLT  --   --   --  396  --  224 227  --   HEPARINUNFRC 1.45* 0.31  --   --   --  0.11*  --  0.21*  CREATININE  --   --   --   --  1.04* 0.95 0.88  --   TROPONINI 10.24*  --   --   --   --   --   --   --    < > = values in this interval not displayed.    Estimated Creatinine Clearance: 66.5 mL/min (by C-G formula based on SCr of 0.88 mg/dL).   Medical History: Past Medical History:  Diagnosis Date  . Depression   . Diabetes mellitus without complication ([place])   . Essential hypertension   . Gastroparesis   . GERD (gastroesophageal reflux disease)   . HLD  (hyperlipidemia)     Medications:  Scheduled:  . aspirin  81 mg Oral Daily  . atorvastatin  80 mg Oral q1800  . busPIRone  10 mg Oral TID  . Chlorhexidine Gluconate Cloth  6 each Topical Daily  . fluticasone  2 spray Each Nare Daily  . insulin aspart  0-5 Units Subcutaneous QHS  . insulin aspart  0-9 Units Subcutaneous TID WC  . insulin detemir  5 Units Subcutaneous QHS  . metoprolol tartrate  25 mg Oral BID  . pyridostigmine  30 mg Oral TID WC  . sodium chloride flush  10-40 mL Intracatheter Q12H  . sodium chloride flush  3 mL Intravenous Q12H    Assessment: 71 yoF admitted with ACS found to have severe 3vCAD. Pt to resume IV heparin while awaiting TCTS consult. Heparin level slightly below goal at 0.21 this morning, Hgb down - no bleeding noted per RN.  Goal of Therapy:  Heparin level 0.3-0.7 units/ml Monitor platelets by anticoagulation protocol: Yes   Plan:  -Increase heparin to 1050 units/hr -Check 6-hr heparin level -F/U plans for CABG vs med mgmt  Arrie Senate,  PharmD, BCPS Clinical Pharmacist [phone] Please check AMION for all [place] numbers 01/08/2018

## 2018-01-09 ENCOUNTER — Inpatient Hospital Stay (HOSPITAL_COMMUNITY): Payer: Medicare Other

## 2018-01-09 ENCOUNTER — Other Ambulatory Visit: Payer: Self-pay | Admitting: *Deleted

## 2018-01-09 ENCOUNTER — Encounter (HOSPITAL_COMMUNITY): Payer: Self-pay | Admitting: Certified Registered Nurse Anesthetist

## 2018-01-09 DIAGNOSIS — I2510 Atherosclerotic heart disease of native coronary artery without angina pectoris: Secondary | ICD-10-CM

## 2018-01-09 DIAGNOSIS — I10 Essential (primary) hypertension: Secondary | ICD-10-CM

## 2018-01-09 DIAGNOSIS — Z01810 Encounter for preprocedural cardiovascular examination: Secondary | ICD-10-CM

## 2018-01-09 DIAGNOSIS — I25110 Atherosclerotic heart disease of native coronary artery with unstable angina pectoris: Secondary | ICD-10-CM

## 2018-01-09 DIAGNOSIS — I214 Non-ST elevation (NSTEMI) myocardial infarction: Secondary | ICD-10-CM

## 2018-01-09 LAB — CBC
HCT: 30.5 % — ABNORMAL LOW (ref 36.0–46.0)
Hemoglobin: 9.5 g/dL — ABNORMAL LOW (ref 12.0–15.0)
MCH: 27.1 pg (ref 26.0–34.0)
MCHC: 31.1 g/dL (ref 30.0–36.0)
MCV: 87.1 fL (ref 78.0–100.0)
PLATELETS: 290 10*3/uL (ref 150–400)
RBC: 3.5 MIL/uL — ABNORMAL LOW (ref 3.87–5.11)
RDW: 17.2 % — AB (ref 11.5–15.5)
WBC: 7.2 10*3/uL (ref 4.0–10.5)

## 2018-01-09 LAB — BASIC METABOLIC PANEL
ANION GAP: 8 (ref 5–15)
BUN: 7 mg/dL (ref 6–20)
CALCIUM: 8.1 mg/dL — AB (ref 8.9–10.3)
CO2: 27 mmol/L (ref 22–32)
CREATININE: 1.02 mg/dL — AB (ref 0.44–1.00)
Chloride: 104 mmol/L (ref 98–111)
Glucose, Bld: 172 mg/dL — ABNORMAL HIGH (ref 70–99)
Potassium: 3 mmol/L — ABNORMAL LOW (ref 3.5–5.1)
Sodium: 139 mmol/L (ref 135–145)

## 2018-01-09 LAB — HEPARIN LEVEL (UNFRACTIONATED)
HEPARIN UNFRACTIONATED: 0.43 [IU]/mL (ref 0.30–0.70)
Heparin Unfractionated: 1.26 IU/mL — ABNORMAL HIGH (ref 0.30–0.70)

## 2018-01-09 LAB — GLUCOSE, CAPILLARY
GLUCOSE-CAPILLARY: 167 mg/dL — AB (ref 70–99)
GLUCOSE-CAPILLARY: 154 mg/dL — AB (ref 70–99)
GLUCOSE-CAPILLARY: 224 mg/dL — AB (ref 70–99)
Glucose-Capillary: 156 mg/dL — ABNORMAL HIGH (ref 70–99)
Glucose-Capillary: 156 mg/dL — ABNORMAL HIGH (ref 70–99)
Glucose-Capillary: 285 mg/dL — ABNORMAL HIGH (ref 70–99)
Glucose-Capillary: 286 mg/dL — ABNORMAL HIGH (ref 70–99)

## 2018-01-09 LAB — PULMONARY FUNCTION TEST
DL/VA % pred: 124 %
DL/VA: 5.5 ml/min/mmHg/L
DLCO cor % pred: 72 %
DLCO cor: 14.76 ml/min/mmHg
DLCO unc % pred: 62 %
DLCO unc: 12.63 ml/min/mmHg
FEF 25-75 Pre: 1.61 L/sec
FEF2575-%Pred-Pre: 72 %
FEV1-%Pred-Pre: 68 %
FEV1-Pre: 1.41 L
FEV1FVC-%Pred-Pre: 104 %
FEV6-%Pred-Pre: 67 %
FEV6-Pre: 1.67 L
FEV6FVC-%Pred-Pre: 103 %
FVC-%Pred-Pre: 65 %
FVC-Pre: 1.67 L
Pre FEV1/FVC ratio: 84 %
Pre FEV6/FVC Ratio: 100 %
RV % pred: 144 %
RV: 2.39 L
TLC % pred: 85 %
TLC: 3.91 L

## 2018-01-09 IMAGING — DX DG ABD PORTABLE 1V
1 series · 1 of 1 positions shown · non-contrast
Comparison: 02/05/2017

CLINICAL DATA: NG tube placement.

EXAM:
PORTABLE ABDOMEN - 1 VIEW

[abdomen kub]
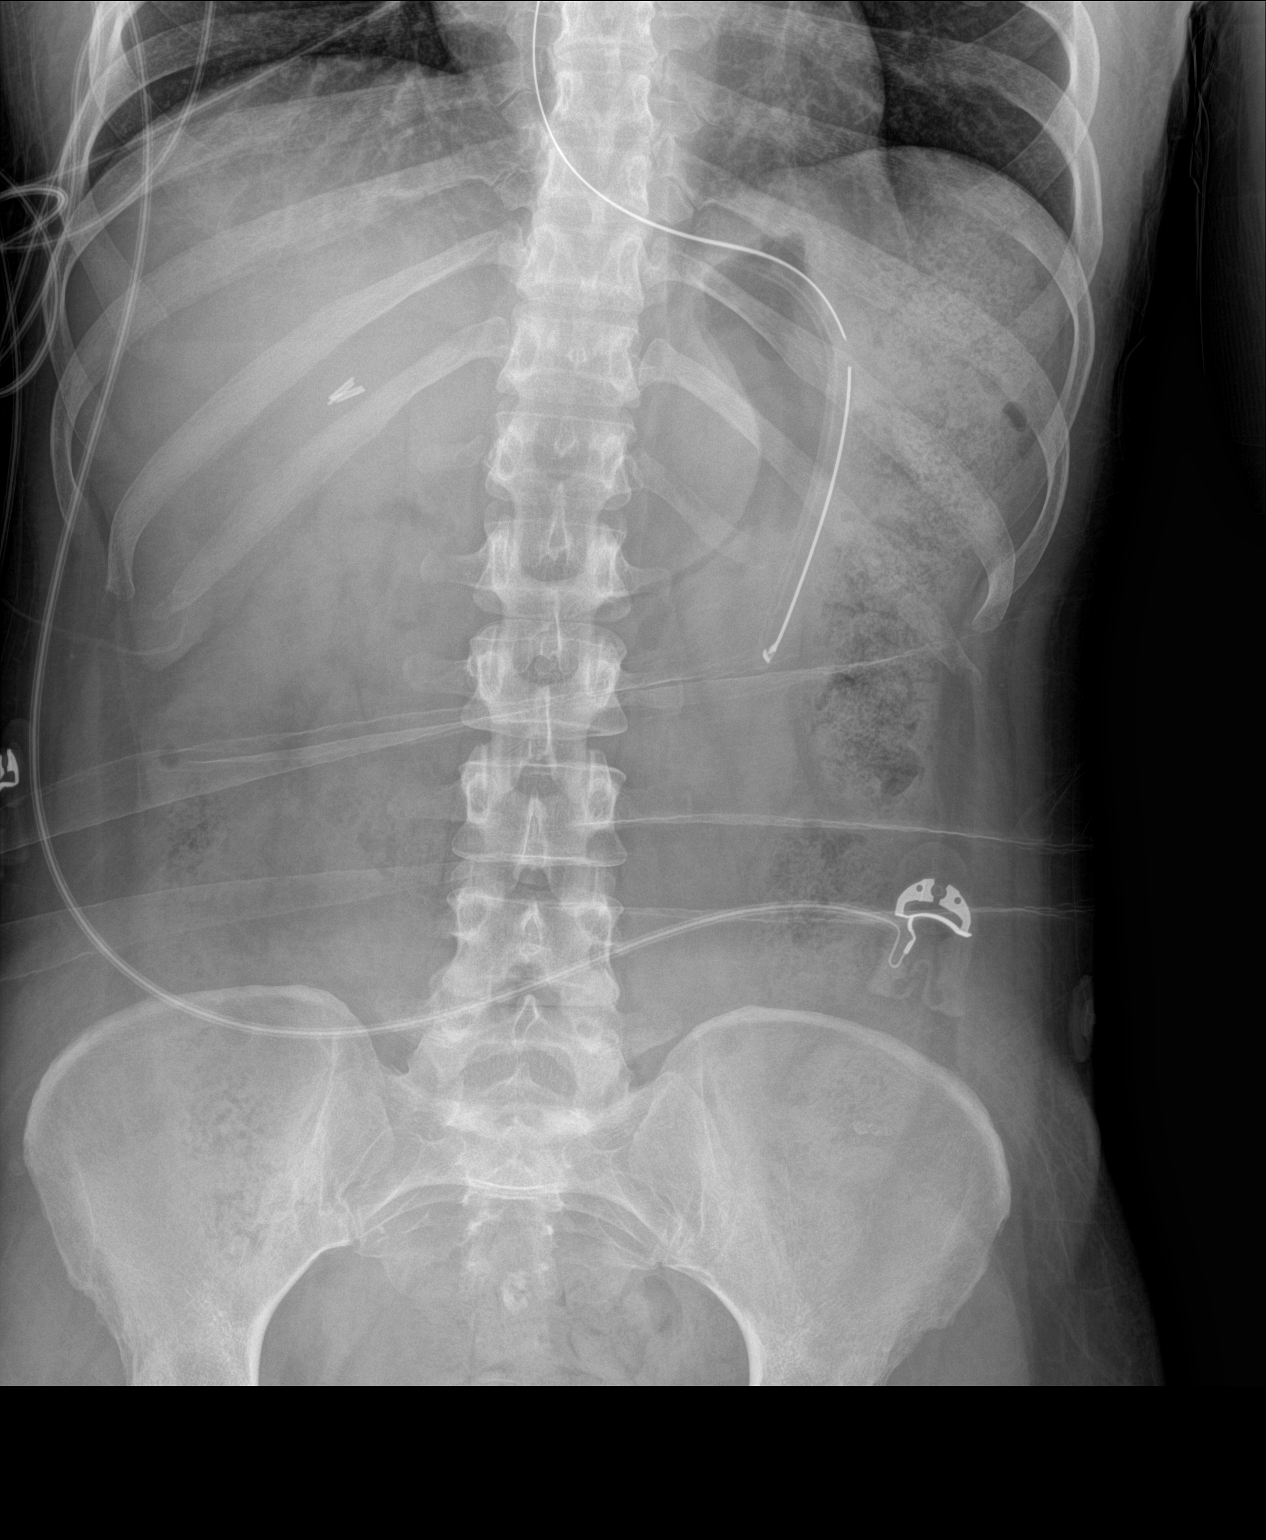

[1 of 1 positions shown; findings below may reference images not displayed]

FINDINGS: An enteric tube overlies the stomach with tip and side hole well
beyond the GE junction. A moderate amount of stool is again seen
throughout the colon. No dilated loops of bowel are seen to suggest
obstruction. Cholecystectomy clips are noted. No acute osseous
abnormality is seen.
IMPRESSION: Enteric tube in the stomach.

## 2018-01-09 IMAGING — DX DG CHEST 1V PORT
1 series · 1 of 1 positions shown · non-contrast
Comparison: Portable chest x-ray March 07, 2017

CLINICAL DATA: Sepsis, diabetes.

EXAM:
PORTABLE CHEST 1 VIEW

[chest ap]
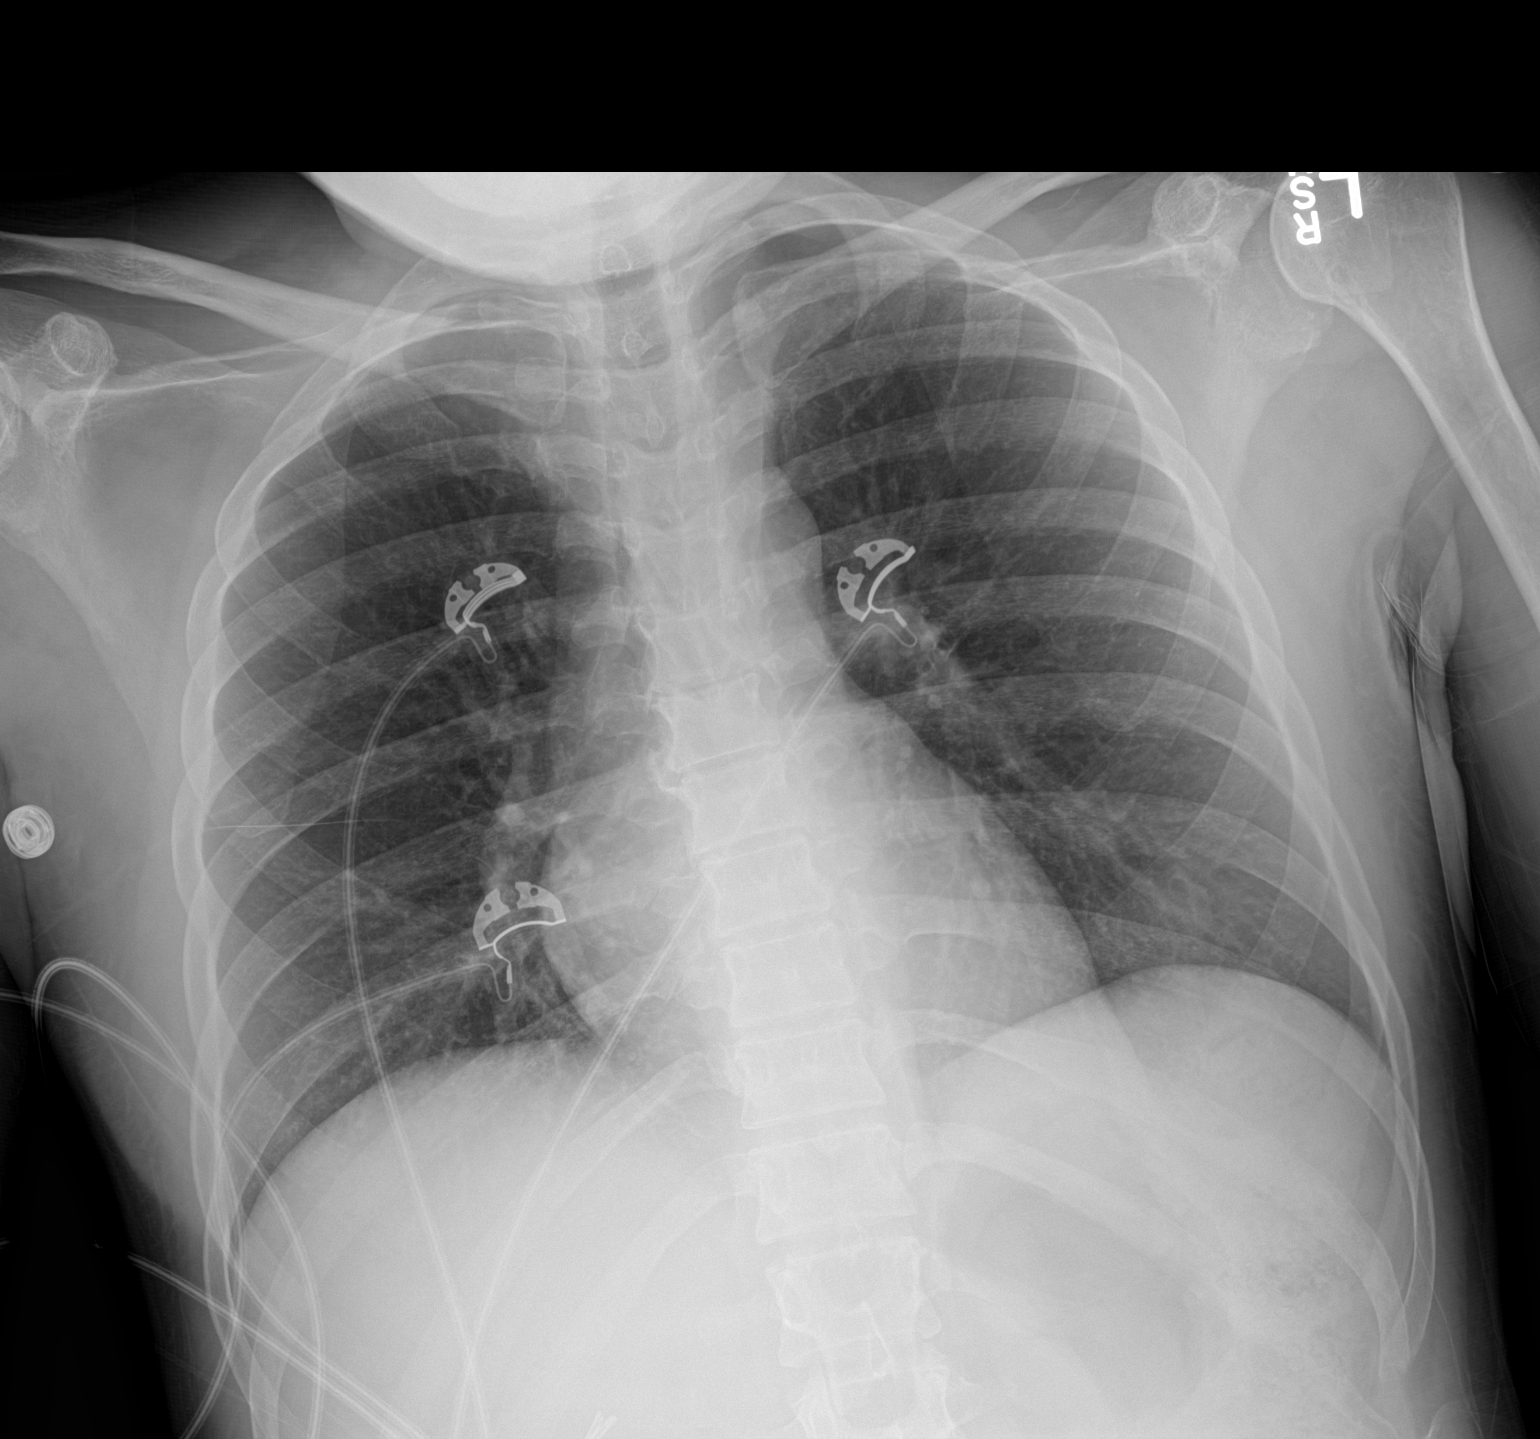

[1 of 1 positions shown; findings below may reference images not displayed]

FINDINGS: The lungs are adequately inflated and clear. The heart and
mediastinal structures are normal. There is no pleural effusion. The
bony thorax is unremarkable. The right-sided PICC line has been
removed.
IMPRESSION: There is no active cardiopulmonary disease.

## 2018-01-09 MED ORDER — DOPAMINE-DEXTROSE 3.2-5 MG/ML-% IV SOLN
0.0000 ug/kg/min | INTRAVENOUS | Status: DC
  Filled 2018-01-09: qty 250

## 2018-01-09 MED ORDER — LOPERAMIDE HCL 2 MG PO CAPS
4.0000 mg | ORAL_CAPSULE | Freq: Once | ORAL | Status: AC
  Administered 2018-01-09: 4 mg via ORAL
  Filled 2018-01-09: qty 2

## 2018-01-09 MED ORDER — POTASSIUM CHLORIDE 2 MEQ/ML IV SOLN
80.0000 meq | INTRAVENOUS | Status: DC
  Filled 2018-01-09: qty 40

## 2018-01-09 MED ORDER — MAGNESIUM SULFATE 50 % IJ SOLN
40.0000 meq | INTRAMUSCULAR | Status: DC
  Filled 2018-01-09: qty 9.85

## 2018-01-09 MED ORDER — DEXMEDETOMIDINE HCL IN NACL 400 MCG/100ML IV SOLN
0.1000 ug/kg/h | INTRAVENOUS | Status: DC
  Filled 2018-01-09: qty 100

## 2018-01-09 MED ORDER — SODIUM CHLORIDE 0.9 % IV SOLN
30.0000 ug/min | INTRAVENOUS | Status: DC
  Filled 2018-01-09: qty 2

## 2018-01-09 MED ORDER — MILRINONE LACTATE IN DEXTROSE 20-5 MG/100ML-% IV SOLN
0.3750 ug/kg/min | INTRAVENOUS | Status: DC
  Filled 2018-01-09: qty 100

## 2018-01-09 MED ORDER — EPINEPHRINE PF 1 MG/ML IJ SOLN
0.0000 ug/min | INTRAVENOUS | Status: DC
  Filled 2018-01-09: qty 4

## 2018-01-09 MED ORDER — NITROGLYCERIN IN D5W 200-5 MCG/ML-% IV SOLN
2.0000 ug/min | INTRAVENOUS | Status: DC
Start: 2018-01-10 — End: 2018-01-09
  Filled 2018-01-09: qty 250

## 2018-01-09 MED ORDER — INSULIN DETEMIR 100 UNIT/ML ~~LOC~~ SOLN
8.0000 [IU] | Freq: Every day | SUBCUTANEOUS | Status: DC
  Administered 2018-01-09: 8 [IU] via SUBCUTANEOUS
  Filled 2018-01-09: qty 0.08

## 2018-01-09 MED ORDER — VANCOMYCIN HCL 10 G IV SOLR
1250.0000 mg | INTRAVENOUS | Status: DC
  Filled 2018-01-09: qty 1250

## 2018-01-09 MED ORDER — PLASMA-LYTE 148 IV SOLN
INTRAVENOUS | Status: DC
  Filled 2018-01-09: qty 2.5

## 2018-01-09 MED ORDER — TRANEXAMIC ACID (OHS) PUMP PRIME SOLUTION
2.0000 mg/kg | INTRAVENOUS | Status: DC
  Filled 2018-01-09: qty 1.32

## 2018-01-09 MED ORDER — SODIUM CHLORIDE 0.9 % IV SOLN
INTRAVENOUS | Status: DC
  Filled 2018-01-09: qty 30

## 2018-01-09 MED ORDER — TRANEXAMIC ACID (OHS) BOLUS VIA INFUSION
15.0000 mg/kg | INTRAVENOUS | Status: DC
  Filled 2018-01-09: qty 992

## 2018-01-09 MED ORDER — TRANEXAMIC ACID 1000 MG/10ML IV SOLN
1.5000 mg/kg/h | INTRAVENOUS | Status: DC
  Filled 2018-01-09: qty 25

## 2018-01-09 MED ORDER — LOPERAMIDE HCL 2 MG PO CAPS
2.0000 mg | ORAL_CAPSULE | Freq: Three times a day (TID) | ORAL | Status: DC | PRN
  Administered 2018-01-09 (×2): 2 mg via ORAL
  Filled 2018-01-09 (×3): qty 1

## 2018-01-09 MED ORDER — POTASSIUM CHLORIDE CRYS ER 20 MEQ PO TBCR
40.0000 meq | EXTENDED_RELEASE_TABLET | Freq: Every day | ORAL | Status: DC
  Administered 2018-01-09 – 2018-01-13 (×5): 40 meq via ORAL
  Filled 2018-01-09 (×5): qty 2

## 2018-01-09 MED ORDER — LEVOFLOXACIN IN D5W 500 MG/100ML IV SOLN
500.0000 mg | INTRAVENOUS | Status: DC
  Filled 2018-01-09: qty 100

## 2018-01-09 MED ORDER — SODIUM CHLORIDE 0.9 % IV SOLN
INTRAVENOUS | Status: AC
  Filled 2018-01-09: qty 1

## 2018-01-09 NOTE — Progress Notes (Signed)
Pre-op Cardiac Surgery  Carotid Findings:  Bilateral - ICA is within normal limits. There vertebral artery flow is antegrade.  Upper Extremity Right Left  Brachial Pressures 139 Triphasic PICC line restricted limb. Triphasic  Radial Waveforms Triphasic Triphasic  Ulnar Waveforms Triphasic Biphasic  Palmar Arch (Allen's Test) Normal Abnormal    Findings:  Palmar arch evaluation -  Doppler waveforms remained normal on the right with both radial and ulnar compression. Left Doppler waveforms obliterated with radial compression and and remained normal with ulnar compression    Lower  Extremity Right Left  Dorsalis Pedis 173 Triphasic 160 Biphasic  Posterior Tibial 167 Triphasic 160 Triphasic  Ankle/Brachial Indices TBI 1.24 / 0.99 1.15 / 0.88   Right Brachial pressure was 139. Unable to obtain left brachial pressure due to restricted limb - PICC line  Findings:  ABIs and Doppler waveforms were normal bilaterally at rest. TBIs were normal bilaterally.  [facility], [place] 01/09/2018, 7:48 PM

## 2018-01-09 NOTE — Progress Notes (Signed)
Progress Note  Patient Name: [name] Date of Encounter: 01/09/2018  Primary Cardiologist: Mertie Moores, MD   Subjective   SOB   No CP    Inpatient Medications    Scheduled Meds: . aspirin  81 mg Oral Daily  . atorvastatin  80 mg Oral q1800  . busPIRone  10 mg Oral TID  . Chlorhexidine Gluconate Cloth  6 each Topical Daily  . fluticasone  2 spray Each Nare Daily  . insulin aspart  0-5 Units Subcutaneous QHS  . insulin aspart  0-9 Units Subcutaneous TID WC  . insulin detemir  5 Units Subcutaneous QHS  . loperamide  4 mg Oral Once  . metoprolol tartrate  25 mg Oral BID  . potassium chloride  40 mEq Oral Daily  . pyridostigmine  30 mg Oral TID WC  . sodium chloride flush  10-40 mL Intracatheter Q12H  . sodium chloride flush  3 mL Intravenous Q12H   Continuous Infusions: . sodium chloride    . heparin 1,050 Units/hr (01/09/18 0706)   PRN Meds: sodium chloride, acetaminophen **OR** acetaminophen, diazepam, loperamide, ondansetron (ZOFRAN) IV, ondansetron **OR** [DISCONTINUED] ondansetron (ZOFRAN) IV, promethazine, sodium chloride flush, sodium chloride flush   Vital Signs    Vitals:   01/08/18 1946 01/08/18 2131 01/08/18 2326 01/09/18 0802  BP:  130/66  126/65  Pulse: 87 85 73 81  Resp: (!) 24  (!) 27 (!) 26  Temp: 98.8 F (37.1 C)  98.1 F (36.7 C) 98.5 F (36.9 C)  TempSrc: Oral  Oral Oral  SpO2:    100%  Weight:      Height:        Intake/Output Summary (Last 24 hours) at 01/09/2018 0906 Last data filed at 01/09/2018 0706 Gross per 24 hour  Intake 373.82 ml  Output 5126 ml  Net -4752.18 ml   Filed Weights   01/05/18 2106  Weight: 66.1 kg (145 lb 11.6 oz)    Telemetry     NSR  - Personally Reviewed  ECG      Physical Exam   GEN: No acute distress.   Neck:  Neck full    Cardiac: RRR, no murmurs, rubs, or gallops.  Respiratory: Rhonchi  Mild rales GI: Soft, nontender, non-distended  MS: No edema;   R groin soft   No bleeding   No  murmurs  No deformity. Neuro:  Nonfocal  Psych: Normal affect   Labs    Chemistry Recent Labs  Lab 01/07/18 0510 01/08/18 0500 01/09/18 0505  NA 139 140 139  K 3.2* 3.3* 3.0*  CL 109 114* 104  CO2 24 22 27   GLUCOSE 221* 123* 172*  BUN 13 8 7   CREATININE 0.95 0.88 1.02*  CALCIUM 7.6* 7.3* 8.1*  PROT 5.2*  --   --   ALBUMIN 2.4*  --   --   AST 29  --   --   ALT 22  --   --   ALKPHOS 99  --   --   BILITOT 0.6  --   --   GFRNONAA >60 >60 >60  GFRAA >60 >60 >60  ANIONGAP 6 4* 8     Hematology Recent Labs  Lab 01/07/18 0510 01/08/18 0500 01/09/18 0505  WBC 4.9 6.3 7.2  RBC 4.32 3.17* 3.50*  HGB 11.9* 8.5* 9.5*  HCT 37.3 28.5* 30.5*  MCV 86.3 89.9 87.1  MCH 27.5 26.8 27.1  MCHC 31.9 29.8* 31.1  RDW 18.2* 17.3* 17.2*  PLT 224 227  290    Cardiac Enzymes Recent Labs  Lab 01/04/18 1544 01/04/18 1825 01/05/18 0203 01/05/18 1901  TROPONINI 2.37* 10.60* 40.47* 10.24*    Recent Labs  Lab 01/04/18 1131  TROPIPOC 0.15*     BNPNo results for input(s): BNP, PROBNP in the last 168 hours.   DDimer No results for input(s): DDIMER in the last 168 hours.   Radiology    Dg Chest Port 1 View  Result Date: 01/07/2018 CLINICAL DATA:  PICC placement EXAM: PORTABLE CHEST 1 VIEW COMPARISON:  01/04/2018 FINDINGS: Left PICC line in place with the tip in the SVC. Patchy airspace disease bilaterally, right greater than left. Small right effusion. Heart is normal size. IMPRESSION: Worsening bilateral airspace disease, right greater than left concerning for multifocal pneumonia. Small right effusion. Left PICC line tip in the lower SVC. Electronically Signed   By: Rolm Baptise M.D.   On: 01/07/2018 10:11    Cardiac Studies  Cardiac Cath 01/07/18    Ost Ramus to Ramus lesion is 70% stenosed.  Ost LAD lesion is 40% stenosed.  Prox LAD lesion is 50% stenosed.  Prox LAD to Mid LAD lesion is 70% stenosed.  Prox Cx to Mid Cx lesion is 70% stenosed.  Mid Cx to Dist Cx  lesion is 60% stenosed.  Mid RCA lesion is 85% stenosed.  Prox RCA lesion is 20% stenosed.  Dist RCA lesion is 50% stenosed.  RPDA lesion is 70% stenosed.   Multi-vessel CAD in this diabetic female with diffuse disease and diabetic appearing vessels.  There is smooth 40% ostial LAD stenosis, diffuse 50% proximal LAD stenosis, significant eccentric calcification in the mid LAD after the diagonal vessel with narrowing of at least 70% and diffuse distal LAD narrowing; diffusely narrowed proximal to mid ramus intermediate vessel; large left circumflex coronary artery with 70% followed by 60% mid AV groove stenosis after the takeoff of the first marginal vessel; and small caliber RCA with diffuse disease with 20% proximal RCA narrowing,  focal 85% mid stenosis, diffuse 50% narrowing before the PDA takeoff, 70% mid PDA stenosis.  Moderately severe global LV dysfunction with an ejection fraction of 30 to 35%.  LVEDP 23 mm.  RECOMMENDATION: Angiograms will be reviewed with colleagues.  Recommend surgical consultation for consideration of CABG revascularization in this 52 year old diabetic female with a 41-year history of diabetes mellitus.  The patient's tightest lesion is her mid RCA but this would not account for her troponin of 40.47.  Increased medical therapy.  Hydrate post catheterization.  Will reinstitute heparin therapy for anticoagulation 10 hours post sheath removal.   Patient Profile     52 y.o. female   Assessment & Plan    1  CAD   Cath as noted above    Currently on heparin   Surgery service has been called to review anatomy    Echo earlier this admit LVEF 55% with hypokinesis of the inferior and apical walls      Pt diuresed with lasix yesteday, says she urinated a lot  Does not want anymore now.    2  HTN  Contineu to follow   Lable    3  HL  LDL 50   Currently on lipitor 80     For questions or updates, please contact [place] Please consult [email or web address] for  contact info under Cardiology/STEMI.      Signed, Dorris Carnes, MD  01/09/2018, 9:06 AM

## 2018-01-09 NOTE — Plan of Care (Signed)
Continue current care plan 

## 2018-01-09 NOTE — Progress Notes (Signed)
1350-1415 Pt just seen by TCTS. Gave pt OHS booklet, care guide and in the tube handout. Discussed how important it will be to walk after surgery. Briefly reviewed sternal precautions as pt seemed overwhelmed. Pt stated she would not have someone with her 24/7 after discharge. Will need to see case manager after surgery. Stated sister will need to work. Wrote down how to view pre op video if she would like to view. Pt eating lunch. Did not give IS at this time. Graylon Good RN BSN 01/09/2018 2:32 PM

## 2018-01-09 NOTE — Care Management Note (Signed)
Case Management Note  Patient Details  Name: Charrisse Masley MRN: [id number] Date of Birth: 02-Nov-1965  Subjective/Objective:    Pt admitted with DKA, also found to have NSTEMI              Action/Plan:  PTA active with [facility] for RN and PT - agency aware of admit.  CM requested resumption orders. CVTS consulted for multi vessel disease.  CM will continue to follow for discharge needs   Expected Discharge Date:  01/07/18               Expected Discharge Plan:  [place]  In-House Referral:     Discharge planning Services  CM Consult  Post Acute Care Choice:    Choice offered to:     DME Arranged:    DME Agency:     HH Arranged:    HH Agency:     Status of Service:  In process, will continue to follow  If discussed at Long Length of Stay Meetings, dates discussed:    Additional Comments:  Maryclare Labrador, RN 01/09/2018, 10:14 AM

## 2018-01-09 NOTE — Progress Notes (Signed)
[facility]                                                                              Patient Demographics  [name], is a 52 y.o. female, DOB - 02-Sep-1965, WNU:[id number]  Admit date - 01/04/2018   Admitting Physician Cristal Ford, DO  Outpatient Primary MD for the patient is [name], MD  Outpatient specialists:   LOS - 5  days   Medical records reviewed and are as summarized below:    Chief Complaint  Patient presents with  . Hyperglycemia       Brief summary  [name] is a 51 y.o. femalewith a medical history ofdiabetes mellitus, medical noncompliance, depression. She presented with DKA, started on IV insulin and now found to have an NSTEMI. Started on heparin drip. DKA resolved.  Assessment & Plan    Principal Problem:   DKA (diabetic ketoacidoses) ([place]) with underlying history of uncontrolled type 1 diabetes mellitus -Patient has recurrent admissions for DKA, currently resolved, patient has been transitioned to subcutaneous insulin -CBGs in 150s to 160s this morning, Lantus was decreased yesterday.  Continue sliding scale insulin sensitive, will increase Levemir to 8 units QHS.  Active Problems:   Non-ST elevation (NSTEMI) myocardial infarction ([place]), troponin peaked at 40.47 -Currently no chest pain.  Underwent cardiac cath on 7/16 which showed multivessel severe coronary disease, recommended surgical consultation for CABG -2D echo showed EF of 55% with hypokinesis of inferior and apical walls -Continue IV heparin  - Called TCTS consult for CABG  evaluation.    Anxiety and depression Continue BuSpar    AKI (acute kidney injury) ([place]) - admitted with creatinine of 2.1, -Creatinine now improved to 1.0 IV fluids has been discontinued.  Diarrhea - C. difficile panel negative.  Patient reports multiple episodes of diarrhea overnight -Will place on loperamide   essential hypertension -Currently stable, continue  metoprolol 25 mg twice daily   Hypokalemia -Placed on potassium supplementation.  Lasix discontinued today.   Lactic acidosis with sepsis ruled out, -No source of infection, likely secondary to DKA and dehydration   Code Status full code DVT Prophylaxis: Heparin drip Family Communication: Discussed in detail with the patient, all imaging results, lab results explained to the patient     Disposition Plan: Awaiting further recommendations from cardiothoracic surgery  Time Spent in minutes   25 minutes   Procedures:  Cardiac cath on 7/16  Consultants:   Cardiology TCTS  Antimicrobials:      Medications  Scheduled Meds: . aspirin  81 mg Oral Daily  . atorvastatin  80 mg Oral q1800  . busPIRone  10 mg Oral TID  . Chlorhexidine Gluconate Cloth  6 each Topical Daily  . fluticasone  2 spray Each Nare Daily  . insulin aspart  0-5 Units Subcutaneous QHS  . insulin aspart  0-9 Units Subcutaneous TID WC  . insulin detemir  5 Units Subcutaneous QHS  . metoprolol tartrate  25 mg Oral BID  . potassium chloride  40 mEq Oral Daily  . pyridostigmine  30 mg Oral TID WC  . sodium chloride flush  10-40 mL Intracatheter Q12H  .  sodium chloride flush  3 mL Intravenous Q12H   Continuous Infusions: . sodium chloride    . heparin 1,050 Units/hr (01/09/18 1200)   PRN Meds:.sodium chloride, acetaminophen **OR** acetaminophen, diazepam, loperamide, ondansetron (ZOFRAN) IV, ondansetron **OR** [DISCONTINUED] ondansetron (ZOFRAN) IV, promethazine, sodium chloride flush, sodium chloride flush   Antibiotics   Anti-infectives (From admission, onward)   None        Subjective:   [name] was seen and examined today.  States not feeling good, had multiple BMs overnight.  No chest pain.  Did not sleep well.  Denies any shortness of breath, abdominal pain, N/V/D/C, new weakness, numbess, tingling.  No fevers.  Objective:   Vitals:   01/08/18 2131 01/08/18 2326 01/09/18 0802  01/09/18 1148  BP: 130/66  126/65 133/67  Pulse: 85 73 81 77  Resp:  (!) 27 (!) 26 (!) 26  Temp:  98.1 F (36.7 C) 98.5 F (36.9 C) 98.7 F (37.1 C)  TempSrc:  Oral Oral Oral  SpO2:   100% 95%  Weight:      Height:        Intake/Output Summary (Last 24 hours) at 01/09/2018 1206 Last data filed at 01/09/2018 1200 Gross per 24 hour  Intake 425.29 ml  Output 3826 ml  Net -3400.71 ml     Wt Readings from Last 3 Encounters:  01/05/18 66.1 kg (145 lb 11.6 oz)  12/23/17 68 kg (150 lb)  12/10/17 65 kg (143 lb 4.8 oz)     Exam    General: Alert and oriented x 3, NAD, ill-appearing  Eyes:   HEENT:    Cardiovascular: S1 S2 auscultated, Regular rate and rhythm. No pedal edema b/l  Respiratory: Decreased breath sound at the bases  Gastrointestinal: Soft, nontender, nondistended, + bowel sounds  Ext: no pedal edema bilaterally  Neuro: no new deficits  Musculoskeletal: No digital cyanosis, clubbing  Skin: No rashes  Psych: flat affect   Data Reviewed:  I have personally reviewed following labs and imaging studies  Micro Results Recent Results (from the past 240 hour(s))  MRSA PCR Screening     Status: None   Collection Time: 01/04/18  3:41 PM  Result Value Ref Range Status   MRSA by PCR NEGATIVE NEGATIVE Final    Comment:        The GeneXpert MRSA Assay (FDA approved for NASAL specimens only), is one component of a comprehensive MRSA colonization surveillance program. It is not intended to diagnose MRSA infection nor to guide or monitor treatment for MRSA infections. Performed at [facility], [place] [address], [place], [place] [address]   C difficile quick scan w PCR reflex     Status: None   Collection Time: 01/06/18  6:37 AM  Result Value Ref Range Status   C Diff antigen NEGATIVE NEGATIVE Final   C Diff toxin NEGATIVE NEGATIVE Final   C Diff interpretation No C. difficile detected.  Final    Comment: Performed at [facility], [place] [address] [place], [place] [address]    Radiology Reports Dg Chest 1 View  Result Date: 12/10/2017 CLINICAL DATA:  52 year old female with abdominal pain and vomiting. Diabetic ketoacidosis. Shortness of breath. EXAM: CHEST  1 VIEW COMPARISON:  Chest and abdominal radiographs 11/22/2017 and earlier. FINDINGS: Portable AP semi upright view at 1811 hours. Lung volumes and mediastinal contours remain normal. Allowing for portable technique the lungs are clear. Visualized tracheal air column is within normal limits. Negative visible bowel gas  pattern. No osseous abnormality identified. IMPRESSION: Negative.  No acute cardiopulmonary abnormality. Electronically Signed   By: Genevie Ann M.D.   On: 12/10/2017 18:34   Dg Abd 1 View  Result Date: 01/04/2018 CLINICAL DATA:  Onset of abdominal pain. EXAM: ABDOMEN - 1 VIEW COMPARISON:  12/23/2017 FINDINGS: Bowel gas pattern is nonobstructive. Again noted is gaseous distension of the stomach, raising the question of aerophagia. Surgical clips are noted in the RIGHT UPPER QUADRANT the abdomen. There is no evidence for free intraperitoneal air. IMPRESSION: 1. No evidence for acute abnormality. 2. Persistent gaseous distension of the stomach. Question of aerophagia. Electronically Signed   By: Nolon Nations M.D.   On: 01/04/2018 16:50   Dg Chest Port 1 View  Result Date: 01/07/2018 CLINICAL DATA:  PICC placement EXAM: PORTABLE CHEST 1 VIEW COMPARISON:  01/04/2018 FINDINGS: Left PICC line in place with the tip in the SVC. Patchy airspace disease bilaterally, right greater than left. Small right effusion. Heart is normal size. IMPRESSION: Worsening bilateral airspace disease, right greater than left concerning for multifocal pneumonia. Small right effusion. Left PICC line tip in the lower SVC. Electronically Signed   By: Rolm Baptise M.D.   On: 01/07/2018 10:11   Dg Chest Portable 1 View  Result Date: 01/04/2018 CLINICAL DATA:  History of  diabetes.  Tachypnea. EXAM: PORTABLE CHEST 1 VIEW COMPARISON:  December 23, 2017 FINDINGS: The heart size and mediastinal contours are within normal limits. Both lungs are clear. The visualized skeletal structures are unremarkable. IMPRESSION: No active disease. Electronically Signed   By: Dorise Bullion III M.D   On: 01/04/2018 11:39   Dg Abd Acute W/chest  Result Date: 12/23/2017 CLINICAL DATA:  Abdominal and back pain. Shortness of breath. History of gastroparesis. EXAM: DG ABDOMEN ACUTE W/ 1V CHEST COMPARISON:  Single-view of the chest 12/10/2017. Plain films chest and abdomen 11/22/2017. FINDINGS: Single-view of the chest demonstrates clear lungs and normal heart size. No pneumothorax or pleural effusion. Two views of the abdomen show no free intraperitoneal air. There is gaseous distention of the stomach. Small and large bowel are unremarkable. No unexpected abdominal calcification or focal bony abnormality. IMPRESSION: Gaseous distention of the stomach may be related to the patient's history of diabetic gastroparesis. The examination is otherwise negative. Electronically Signed   By: [name] Rise M.D.   On: 12/23/2017 19:01   Dg Abd Portable 1v  Result Date: 12/13/2017 CLINICAL DATA:  Nausea.  History of diabetes.  GERD, hypertension. EXAM: PORTABLE ABDOMEN - 1 VIEW COMPARISON:  11/22/2017 FINDINGS: Bowel gas pattern is nonobstructive. Moderate stool burden. Surgical clips are seen in the RIGHT UPPER QUADRANT. No organomegaly or abnormal calcifications. Visualized osseous structures have a normal appearance. IMPRESSION: Negative. Electronically Signed   By: Nolon Nations M.D.   On: 12/13/2017 08:37   Irpicc Placement Left >5 Yrs Inc Img Guide  Result Date: 01/06/2018 CLINICAL DATA:  STEMI, poor venous access EXAM: PICC PLACEMENT WITH ULTRASOUND AND FLUOROSCOPY FLUOROSCOPY TIME:  0.1 minute; 34 uGym2 DAP TECHNIQUE: After written informed consent was obtained, patient was placed in the supine  position on angiographic table. Patency of the left basilic vein was confirmed with ultrasound with image documentation. An appropriate skin site was determined. Skin site was marked. Region was prepped using maximum barrier technique including cap and mask, sterile gown, sterile gloves, large sterile sheet, and Chlorhexidine as cutaneous antisepsis. The region was infiltrated locally with 1% lidocaine. Under real-time ultrasound guidance, the left basilic vein was accessed with  a 21 gauge micropuncture needle; the needle tip within the vein was confirmed with ultrasound image documentation. Needle exchanged over a 018 guidewire for a peel-away sheath, through which a 5-French double-lumen power injectable PICC trimmed to 35cm was advanced, positioned with its tip near the cavoatrial junction. Spot chest radiograph confirms appropriate catheter position. Catheter was flushed per protocol and secured externally. The patient tolerated procedure well. COMPLICATIONS: COMPLICATIONS none IMPRESSION: 1. Technically successful five [place] double lumen power injectable PICC placement Electronically Signed   By: Lucrezia Europe M.D.   On: 01/06/2018 16:55   Irpicc Placement Left >5 Yrs Inc Img Guide  Result Date: 12/23/2017 INDICATION: 52 year old female with a history of DKA EXAM: PICC LINE PLACEMENT WITH ULTRASOUND AND FLUOROSCOPIC GUIDANCE MEDICATIONS: None ANESTHESIA/SEDATION: None FLUOROSCOPY TIME:  Fluoroscopy Time: 0 minutes 42 seconds (1.9 mGy). COMPLICATIONS: None PROCEDURE: Informed written consent was obtained from the patient after a thorough discussion of the procedural risks, benefits and alternatives. All questions were addressed. Maximal Sterile Barrier Technique was utilized including caps, mask, sterile gowns, sterile gloves, sterile drape, hand hygiene and skin antiseptic. A timeout was performed prior to the initiation of the procedure. Patient was position in the supine position on the fluoroscopy table  with the right arm abducted 90 degrees. Ultrasound survey of the upper extremity was performed with images stored and sent to PACs. The left brachial vein was selected for access. Once the patient was prepped and draped in the usual sterile fashion, the skin and subcutaneous tissues were generously infiltrated with 1% lidocaine for local anesthesia. A micropuncture access kit was then used to access the targeted vein. Wire was passed centrally, confirmed to be within the venous system under fluoroscopy. A small stab incision was made with an 11 blade scalpel and the sheath was then placed over the wire. Estimated length of the catheter was then performed with the indwelling wire. Catheter was amputated at 41 cm length and placed with coaxial wire through the peel-away. Double-lumen, power injectable PICC in the left brachial vein. Tip confirmed at the cavoatrial junction, and the catheter is ready for use. Stat lock was placed. Patient tolerated the procedure well and remained hemodynamically stable throughout. No complications were encountered and no significant blood loss was encountered. IMPRESSION: Status post left upper extremity PICC. Signed, Dulcy Fanny. Dellia Nims, RPVI [facility] Radiology PLAN: Anticipate future PICC placement to be difficult, with limited venous access and small caliber veins. Electronically Signed   By: Corrie Mckusick D.O.   On: 12/23/2017 17:33   Ir Picc Placement Right >5 Yrs Inc Img Guide  Result Date: 12/13/2017 CLINICAL DATA:  Diabetic ketoacidosis, poor peripheral IV access EXAM: PICC PLACEMENT WITH ULTRASOUND AND FLUOROSCOPY FLUOROSCOPY TIME:  1.3 minutes; 196 uGym2 DAP TECHNIQUE: After written informed consent was obtained, patient was placed in the supine position on angiographic table. Patency of the right brachial vein was confirmed with ultrasound with image documentation. An appropriate skin site was determined. Skin site was  marked. Region was prepped using maximum barrier technique including cap and mask, sterile gown, sterile gloves, large sterile sheet, and Chlorhexidine as cutaneous antisepsis. The region was infiltrated locally with 1% lidocaine. Under real-time ultrasound guidance, the right brachial vein was accessed with a 21 gauge micropuncture needle; the needle tip within the vein was confirmed with ultrasound image documentation. Needle exchanged over a 018 guidewire for a peel-away sheath, through which a 5-French double-lumen power injectable PICC trimmed to 30cm was advanced, positioned  with its tip near the cavoatrial junction. There was focal resistance in the central subclavian vein, overcome by using 2 parallel 018 guidewires. Spot chest radiograph confirms appropriate catheter position. Catheter was flushed per protocol and secured externally. The patient tolerated procedure well. COMPLICATIONS: COMPLICATIONS none IMPRESSION: 1. Technically successful five [place] double lumen power injectable PICC placement Electronically Signed   By: Lucrezia Europe M.D.   On: 12/13/2017 16:49   [place] Ekg Site Rite  Result Date: 01/05/2018 If Site Rite image not attached, placement could not be confirmed due to current cardiac rhythm.   Lab Data:  CBC: Recent Labs  Lab 01/05/18 0850 01/06/18 0359 01/07/18 0510 01/08/18 0500 01/09/18 0505  WBC 28.7* 18.4* 4.9 6.3 7.2  HGB 9.4* 10.0* 11.9* 8.5* 9.5*  HCT 29.0* 31.2* 37.3 28.5* 30.5*  MCV 84.3 84.6 86.3 89.9 87.1  PLT 486* 396 224 227 224   Basic Metabolic Panel: Recent Labs  Lab 01/05/18 1440 01/06/18 0753 01/07/18 0510 01/08/18 0500 01/09/18 0505  NA 146* 144 139 140 139  K 4.4 4.2 3.2* 3.3* 3.0*  CL 117* 117* 109 114* 104  CO2 17* 18* _0 GLUCOSE 173* 59* 221* 123* 172*  BUN _1 CREATININE 1.44* 1.04* 0.95 0.88 1.02*  CALCIUM 8.4* 8.6* 7.6* 7.3* 8.1*   GFR: Estimated Creatinine Clearance: 57.4 mL/min (A) (by C-G formula based on SCr  of 1.02 mg/dL (H)). Liver Function Tests: Recent Labs  Lab 01/07/18 0510  AST 29  ALT 22  ALKPHOS 99  BILITOT 0.6  PROT 5.2*  ALBUMIN 2.4*   No results for input(s): LIPASE, AMYLASE in the last 168 hours. No results for input(s): AMMONIA in the last 168 hours. Coagulation Profile: Recent Labs  Lab 01/04/18 1825  INR 1.27   Cardiac Enzymes: Recent Labs  Lab 01/04/18 1544 01/04/18 1825 01/05/18 0203 01/05/18 1901  TROPONINI 2.37* 10.60* 40.47* 10.24*   BNP (last 3 results) No results for input(s): PROBNP in the last 8760 hours. HbA1C: Recent Labs    01/08/18 0500  HGBA1C 8.6*   CBG: Recent Labs  Lab 01/08/18 1730 01/08/18 2013 01/09/18 0000 01/09/18 0410 01/09/18 0804  GLUCAP 260* 253* 156* 156* 167*   Lipid Profile: Recent Labs    01/07/18 0510  CHOL 121  HDL 47  LDLCALC 54  TRIG 101  CHOLHDL 2.6   Thyroid Function Tests: No results for input(s): TSH, T4TOTAL, FREET4, T3FREE, THYROIDAB in the last 72 hours. Anemia Panel: No results for input(s): VITAMINB12, FOLATE, FERRITIN, TIBC, IRON, RETICCTPCT in the last 72 hours. Urine analysis:    Component Value Date/Time   COLORURINE YELLOW 01/06/2018 2008   APPEARANCEUR CLEAR 01/06/2018 2008   LABSPEC 1.015 01/06/2018 2008   PHURINE 6.0 01/06/2018 2008   GLUCOSEU >=500 (A) 01/06/2018 2008   HGBUR NEGATIVE 01/06/2018 2008   BILIRUBINUR NEGATIVE 01/06/2018 2008   BILIRUBINUR neg 01/17/2017 1533   KETONESUR 5 (A) 01/06/2018 2008   PROTEINUR 30 (A) 01/06/2018 2008   UROBILINOGEN 0.2 01/17/2017 1533   UROBILINOGEN 0.2 04/27/2015 1608   NITRITE NEGATIVE 01/06/2018 2008   LEUKOCYTESUR NEGATIVE 01/06/2018 2008     Alysia Scism M.D. [facility] 01/09/2018, 12:06 PM  Pager: [phone] Between 7am to 7pm - call Pager - [phone]  After 7pm go to [email or web address] - password [email or web address]  Call night coverage person covering after 7pm

## 2018-01-09 NOTE — Progress Notes (Signed)
Patient called to say she was sweating profusely, pillow wet and gown soaked,  With bedlinen,wanted to have hygienic needs met , Bs -285 skin warm.hygienic needs met and linen changed , assisted back to bed.

## 2018-01-09 NOTE — Consult Note (Addendum)
[address]       [place] [address]             [phone]        [name] [place] Medical Record #[id number] Date of Birth: 02-18-1966  Referring: No ref. provider found Primary Care: [name], MD Primary Cardiologist:Philip Nahser, MD  Chief Complaint:    Chief Complaint  Patient presents with  . Hyperglycemia    History of Present Illness:      [name] is a 52 year old female patient with a past medical history significant for depression, anxiety, peripheral neuropathy, uncontrolled diabetes mellitus with several admissions for DKA, medication noncompliance, and hyperlipidemia who presented to [facility] emergency department with complaints of abdominal discomfort and nausea as well as back pain.  Patient's family notes that last year she had more than 53 visits the emergency room and numerous admissions for poorly controlled diabetes and gastroparesis with inability to eat.  The patient states that she was taking her Levemir but was not taking her sliding scale insulin.  She was found to have a blood glucose level of 892.  While being treated it, they cycled her troponin level.  She was found to have a critical troponin level of 2.37 on 01/04/2018, troponins peaked at 40 the following day.  cardiology was consulted at this time.  Cardiac catheterization was recommended and performed on 01/07/2018.  She was found to have a multivessel coronary artery disease involving the LAD, circumflex, RCA, and ostial ramus.  Ejection fraction was estimated at 30 to 35%.  She was diagnosed with an NSTEMI and started on heparin drip.  Her peak troponin reached 40.47.  We are being consulted for surgical revascularization notified of the consult on July 18. She never had any chest pain and her only complaint during my interview was shortness of breath.   Patient's family note that she is in very inactive at home spending most the time sitting.  She has had  several admissions to rehab centers after being discharged from the hospital over the past 2 years,  She has poor vascular access a PICC line had to be placed on this admission to obtain any IV access Patient's creatinine is been as high as 2.3 on this admission  Current Activity/ Functional Status: Patient was independent with mobility/ambulation, transfers, ADL's, IADL's.   Zubrod Score: At the time of surgery this patient's most appropriate activity status/level should be described as: '[]'$     0    Normal activity, no symptoms '[x]'$     1    Restricted in physical strenuous activity but ambulatory, able to do out light work '[]'$     2    Ambulatory and capable of self care, unable to do work activities, up and about                 more than 50%  Of the time                            '[]'$     3    Only limited self care, in bed greater than 50% of waking hours '[]'$     4    Completely disabled, no self care, confined to bed or chair '[]'$     5    Moribund  Past Medical History:  Diagnosis Date  . Depression   . Diabetes mellitus without complication ([place])   .  Essential hypertension   . Gastroparesis   . GERD (gastroesophageal reflux disease)   . HLD (hyperlipidemia)     Past Surgical History:  Procedure Laterality Date  . COLONOSCOPY  09/27/2014   at [facility]  . ESOPHAGOGASTRODUODENOSCOPY  09/27/2014   at [facility], Dr Rolan Lipa. biospy neg for celiac, neg for H pylori.   . EYE SURGERY    . gailstones    . IR FLUORO GUIDE CV LINE RIGHT  02/01/2017  . IR FLUORO GUIDE CV LINE RIGHT  03/06/2017  . IR FLUORO GUIDE CV LINE RIGHT  03/25/2017  . IR GENERIC HISTORICAL  01/24/2016   IR FLUORO GUIDE CV LINE RIGHT 01/24/2016 Darrell K Allred, PA-C [facility]  . IR GENERIC HISTORICAL  01/24/2016   IR US GUIDE VASC ACCESS RIGHT 01/24/2016 Darrell K Allred, PA-C [facility]  . IR US GUIDE VASC ACCESS RIGHT  02/01/2017  . IR US GUIDE VASC ACCESS RIGHT  03/06/2017  . IR US GUIDE VASC ACCESS RIGHT  03/25/2017  .  LEFT HEART CATH AND CORONARY ANGIOGRAPHY N/A 01/07/2018   Procedure: LEFT HEART CATH AND CORONARY ANGIOGRAPHY;  Surgeon: Troy Sine, MD;  Location: [facility];  Service: Cardiovascular;  Laterality: N/A;  . POSTERIOR VITRECTOMY AND MEMBRANE PEEL-LEFT EYE  09/28/2002  . POSTERIOR VITRECTOMY AND MEMBRANE PEEL-RIGHT EYE  03/16/2002  . RETINAL DETACHMENT SURGERY      Social History   Tobacco Use  Smoking Status Never Smoker  Smokeless Tobacco Never Used    Social History   Substance and Sexual Activity  Alcohol Use No     Allergies  Allergen Reactions  . Anesthetics, Amide Nausea And Vomiting  . Penicillins Diarrhea, Nausea And Vomiting and Other (See Comments)    Has patient had a PCN reaction causing immediate rash, facial/tongue/throat swelling, SOB or lightheadedness with hypotension: Yes Has patient had a PCN reaction causing severe rash involving mucus membranes or skin necrosis: No Has patient had a PCN reaction that required hospitalization No Has patient had a PCN reaction occurring within the last 10 years: Yes  If all of the above answers are "NO", then may proceed with Cephalosporin use.   . Buprenorphine Hcl Rash  . Encainide Nausea And Vomiting  . Metoclopramide Other (See Comments)    Dystonia, muscle rigidity.     Current Facility-Administered Medications  Medication Dose Route Frequency Provider Last Rate Last Dose  . 0.9 %  sodium chloride infusion  250 mL Intravenous PRN Troy Sine, MD      . acetaminophen (TYLENOL) tablet 650 mg  650 mg Oral Q6H PRN Rai, Ripudeep K, MD       Or  . acetaminophen (TYLENOL) suppository 650 mg  650 mg Rectal Q6H PRN Rai, Ripudeep K, MD      . aspirin chewable tablet 81 mg  81 mg Oral Daily Troy Sine, MD   81 mg at 01/09/18 0914  . atorvastatin (LIPITOR) tablet 80 mg  80 mg Oral q1800 Troy Sine, MD   80 mg at 01/08/18 1748  . busPIRone (BUSPAR) tablet 10 mg  10 mg Oral TID [place], DO   10 mg  at 01/09/18 0915  . Chlorhexidine Gluconate Cloth 2 % PADS 6 each  6 each Topical Daily Mariel Aloe, MD   6 each at 01/07/18 [phone]  . diazepam (VALIUM) tablet 5 mg  5 mg Oral Q6H PRN Troy Sine, MD   5 mg at 01/08/18 1748  .  fluticasone (FLONASE) 50 MCG/ACT nasal spray 2 spray  2 spray Each Nare Daily Mikhail, Maryann, DO      . heparin ADULT infusion 100 units/mL (25000 units/279m sodium chloride 0.45%)  1,050 Units/hr Intravenous Continuous BWynona NeatP, RPH 10.5 mL/hr at 01/09/18 1200 1,050 Units/hr at 01/09/18 1200  . insulin aspart (novoLOG) injection 0-5 Units  0-5 Units Subcutaneous QHS Rai, Ripudeep K, MD   3 Units at 01/08/18 2131  . insulin aspart (novoLOG) injection 0-9 Units  0-9 Units Subcutaneous TID WC Rai, Ripudeep K, MD   2 Units at 01/09/18 0924  . insulin detemir (LEVEMIR) injection 8 Units  8 Units Subcutaneous QHS Rai, Ripudeep K, MD      . loperamide (IMODIUM) capsule 2 mg  2 mg Oral Q8H PRN Rai, Ripudeep K, MD      . metoprolol tartrate (LOPRESSOR) tablet 25 mg  25 mg Oral BID RFay Records MD   25 mg at 01/09/18 0914  . ondansetron (ZOFRAN) injection 4 mg  4 mg Intravenous Q6H PRN KTroy Sine MD   4 mg at 01/08/18 1345  . ondansetron (ZOFRAN) tablet 4 mg  4 mg Oral Q6H PRN Mikhail, MVelta Addison DO      . potassium chloride SA (K-DUR,KLOR-CON) CR tablet 40 mEq  40 mEq Oral Daily Rai, Ripudeep K, MD   40 mEq at 01/09/18 0914  . promethazine (PHENERGAN) injection 12.5 mg  12.5 mg Intravenous Q6H PRN Bodenheimer, Charles A, NP   12.5 mg at 01/05/18 1740  . pyridostigmine (MESTINON) tablet 30 mg  30 mg Oral TID WC NMariel Aloe MD   30 mg at 01/08/18 1748  . sodium chloride flush (NS) 0.9 % injection 10-40 mL  10-40 mL Intracatheter Q12H NMariel Aloe MD   20 mL at 01/09/18 0925  . sodium chloride flush (NS) 0.9 % injection 10-40 mL  10-40 mL Intracatheter PRN NMariel Aloe MD      . sodium chloride flush (NS) 0.9 % injection 3 mL  3 mL Intravenous Q12H  KTroy Sine MD   3 mL at 01/08/18 2137  . sodium chloride flush (NS) 0.9 % injection 3 mL  3 mL Intravenous PRN KTroy Sine MD        Medications Prior to Admission  Medication Sig Dispense Refill Last Dose  . bisacodyl (DULCOLAX) 5 MG EC tablet Take 5 mg by mouth daily as needed for moderate constipation.   Past Week at Unknown time  . busPIRone (BUSPAR) 10 MG tablet Take 1 tablet (10 mg total) by mouth 3 (three) times daily. 90 tablet 3 01/03/2018 at Unknown time  . furosemide (LASIX) 20 MG tablet Take 1 tablet (20 mg total) by mouth daily as needed for fluid or edema. 30 tablet 9 Past Week at Unknown time  . insulin aspart (NOVOLOG) 100 UNIT/ML injection Inject 0-12 Units into the skin 4 (four) times daily -  before meals and at bedtime. Uses a sliding scale: 70-119 = 0 units, 120-150 = 2 units, 151-180 =  4 units, 181-210 = 5 units, 211-240 = 6 units, 241-270 = 8 units, 271-300 = 9 units, 301-330 = 10 units, 331-360 = 11 units, 361-400 = 12 units, greater than 400 notify MD.   01/03/2018 at Unknown time  . insulin detemir (LEVEMIR) 100 UNIT/ML injection Inject 0.15 mLs (15 Units total) into the skin every morning. 10 mL 1 01/03/2018 at Unknown time  . mirtazapine (REMERON) 15 MG tablet Take 1  tablet (15 mg total) by mouth at bedtime. 30 tablet 3 01/03/2018 at Unknown time  . ondansetron (ZOFRAN ODT) 4 MG disintegrating tablet Take 1 tablet (4 mg total) by mouth every 8 (eight) hours as needed for nausea or vomiting. 20 tablet 0 Past Week at Unknown time  . pantoprazole (PROTONIX) 40 MG tablet Take 1 tablet (40 mg total) by mouth daily. 30 tablet 3 01/03/2018 at Unknown time  . polyethylene glycol (MIRALAX / GLYCOLAX) packet Take 17 g by mouth daily as needed for mild constipation.    Past Week at Unknown time  . pregabalin (LYRICA) 150 MG capsule Take 1 capsule (150 mg total) by mouth daily. 180 capsule 1 Past Week at Unknown time  . pyridostigmine (MESTINON) 60 MG tablet Take 0.5 tablets  (30 mg total) by mouth 3 (three) times daily. Take 1/2 with meals 60 tablet 0 Past Week at Unknown time  . Syringe/Needle, Disp, (SYRINGE 3CC/22GX1-1/2") 22G X 1-1/2" 3 ML MISC Inject 1,000 mcg into the muscle daily. 50 each 0 12/24/2017  . fluticasone (FLONASE) 50 MCG/ACT nasal spray Place 2 sprays into both nostrils daily. (Patient not taking: Reported on 01/04/2018) 16 g 0 Not Taking at Unknown time    Family History  Problem Relation Age of Onset  . Cystic fibrosis Mother   . Hypertension Father   . Diabetes Brother   . Hypertension Maternal Grandmother      Review of Systems:   Review of Systems  Constitutional: Positive for malaise/fatigue.  Respiratory: Positive for shortness of breath. Negative for cough, sputum production and wheezing.   Cardiovascular: Negative for chest pain, palpitations and leg swelling.  Gastrointestinal: Positive for abdominal pain.  Psychiatric/Behavioral: Positive for depression.   Pertinent items are noted in HPI.        Physical Exam: BP 133/67 (BP Location: Right Arm)   Pulse 77   Temp 98.7 F (37.1 C) (Oral)   Resp (!) 26   Ht 5' 1"  (1.549 m)   Wt 66.1 kg (145 lb 11.6 oz)   LMP  (LMP Unknown)   SpO2 95%   BMI 27.53 kg/m    General appearance: alert, cooperative and no distress Resp: clear to auscultation bilaterally Cardio: regular rate and rhythm, S1, S2 normal, no murmur, click, rub or gallop Extremities: extremities normal, atraumatic, no cyanosis or edema, good distal pulses Neurologic: Grossly normal  Diagnostic Studies & Laboratory data:   Ost Ramus to Ramus lesion is 70% stenosed.  Ost LAD lesion is 40% stenosed.  Prox LAD lesion is 50% stenosed.  Prox LAD to Mid LAD lesion is 70% stenosed.  Prox Cx to Mid Cx lesion is 70% stenosed.  Mid Cx to Dist Cx lesion is 60% stenosed.  Mid RCA lesion is 85% stenosed.  Prox RCA lesion is 20% stenosed.  Dist RCA lesion is 50% stenosed.  RPDA lesion is 70% stenosed.     Multi-vessel CAD in this diabetic female with diffuse disease and diabetic appearing vessels.  There is smooth 40% ostial LAD stenosis, diffuse 50% proximal LAD stenosis, significant eccentric calcification in the mid LAD after the diagonal vessel with narrowing of at least 70% and diffuse distal LAD narrowing; diffusely narrowed proximal to mid ramus intermediate vessel; large left circumflex coronary artery with 70% followed by 60% mid AV groove stenosis after the takeoff of the first marginal vessel; and small caliber RCA with diffuse disease with 20% proximal RCA narrowing,  focal 85% mid stenosis, diffuse 50% narrowing before the PDA  takeoff, 70% mid PDA stenosis.  Moderately severe global LV dysfunction with an ejection fraction of 30 to 35%.  LVEDP 23 mm.  RECOMMENDATION: Angiograms will be reviewed with colleagues.  Recommend surgical consultation for consideration of CABG revascularization in this 52 year old diabetic female with a 41-year history of diabetes mellitus.  The patient's tightest lesion is her mid RCA but this would not account for her troponin of 40.47.  Increased medical therapy.  Hydrate post catheterization.  Will reinstitute heparin therapy for anticoagulation 10 hours post sheath removal.   I have independently reviewed the above  cath films and reviewed the findings with the  patient and family.     Result status: Final result                              *[facility] [employer].                        [place], [place] [address]                            [phone]  ------------------------------------------------------------------- Transthoracic Echocardiography  Patient:    [name], [name] MR #:       [id number] Study Date: 01/05/2018 Gender:     F Age:        54 Height:     154.9 cm Weight:     68 kg BSA:        1.73 m^2 Pt. Status: Room:       [address]     [place] [id number]  PERFORMING   [facility], Inpatient  ATTENDING    Mariel Aloe  ORDERING     Mariel Aloe  REFERRING    Mariel Aloe  SONOGRAPHER  Chelsea Androw  cc:  ------------------------------------------------------------------- LV EF: 55%  ------------------------------------------------------------------- Indications:      (Elevated troponin).  ------------------------------------------------------------------- History:   PMH:  Acute kidney injury  Risk factors:  Acute renal failure. Hypertension. Diabetes mellitus.  ------------------------------------------------------------------- Study Conclusions  - Left ventricle: The cavity size was normal. Wall thickness was   normal. The estimated ejection fraction was 55%. There is   hypokinesis of the apicalinferior myocardium. Left ventricular   diastolic function parameters were normal. - Mitral valve: There was mild regurgitation. - Left atrium: The atrium was mildly dilated. - Right atrium: Central venous pressure (est): 8 mm Hg. - Tricuspid valve: There was trivial regurgitation. - Pulmonary arteries: Systolic pressure could not be accurately   estimated. - Pericardium, extracardiac: There was no pericardial effusion.          Recent Radiology Findings:  Dg Abd 1 View  Result Date: 01/04/2018 CLINICAL DATA:  Onset of abdominal pain. EXAM: ABDOMEN - 1 VIEW COMPARISON:  12/23/2017 FINDINGS: Bowel gas pattern is nonobstructive. Again noted is gaseous distension of the stomach, raising the question of aerophagia. Surgical clips are noted in the RIGHT UPPER QUADRANT the abdomen. There is no evidence for free intraperitoneal air. IMPRESSION: 1. No evidence for acute abnormality. 2. Persistent gaseous distension of the stomach. Question of aerophagia. Electronically Signed  By: Nolon Nations M.D.   On: 01/04/2018 16:50   Dg Chest Port 1 View  Result Date: 01/07/2018 CLINICAL DATA:  PICC placement  EXAM: PORTABLE CHEST 1 VIEW COMPARISON:  01/04/2018 FINDINGS: Left PICC line in place with the tip in the SVC. Patchy airspace disease bilaterally, right greater than left. Small right effusion. Heart is normal size. IMPRESSION: Worsening bilateral airspace disease, right greater than left concerning for multifocal pneumonia. Small right effusion. Left PICC line tip in the lower SVC. Electronically Signed   By: Rolm Baptise M.D.   On: 01/07/2018 10:11   Dg Chest Portable 1 View  Result Date: 01/04/2018 CLINICAL DATA:  History of diabetes.  Tachypnea. EXAM: PORTABLE CHEST 1 VIEW COMPARISON:  December 23, 2017 FINDINGS: The heart size and mediastinal contours are within normal limits. Both lungs are clear. The visualized skeletal structures are unremarkable. IMPRESSION: No active disease. Electronically Signed   By: Dorise Bullion III M.D   On: 01/04/2018 11:39   Dg Abd Acute W/chest  Result Date: 12/23/2017 CLINICAL DATA:  Abdominal and back pain. Shortness of breath. History of gastroparesis. EXAM: DG ABDOMEN ACUTE W/ 1V CHEST COMPARISON:  Single-view of the chest 12/10/2017. Plain films chest and abdomen 11/22/2017. FINDINGS: Single-view of the chest demonstrates clear lungs and normal heart size. No pneumothorax or pleural effusion. Two views of the abdomen show no free intraperitoneal air. There is gaseous distention of the stomach. Small and large bowel are unremarkable. No unexpected abdominal calcification or focal bony abnormality. IMPRESSION: Gaseous distention of the stomach may be related to the patient's history of diabetic gastroparesis. The examination is otherwise negative. Electronically Signed   By: [name] Rise M.D.   On: 12/23/2017 19:01   Dg Abd Portable 1v  Result Date: 12/13/2017 CLINICAL DATA:  Nausea.  History of diabetes.  GERD, hypertension. EXAM: PORTABLE ABDOMEN - 1 VIEW COMPARISON:  11/22/2017 FINDINGS: Bowel gas pattern is nonobstructive. Moderate stool burden. Surgical clips  are seen in the RIGHT UPPER QUADRANT. No organomegaly or abnormal calcifications. Visualized osseous structures have a normal appearance. IMPRESSION: Negative. Electronically Signed   By: Nolon Nations M.D.   On: 12/13/2017 08:37   Irpicc Placement Left >5 Yrs Inc Img Guide  Result Date: 01/06/2018 CLINICAL DATA:  STEMI, poor venous access EXAM: PICC PLACEMENT WITH ULTRASOUND AND FLUOROSCOPY FLUOROSCOPY TIME:  0.1 minute; 34 uGym2 DAP TECHNIQUE: After written informed consent was obtained, patient was placed in the supine position on angiographic table. Patency of the left basilic vein was confirmed with ultrasound with image documentation. An appropriate skin site was determined. Skin site was marked. Region was prepped using maximum barrier technique including cap and mask, sterile gown, sterile gloves, large sterile sheet, and Chlorhexidine as cutaneous antisepsis. The region was infiltrated locally with 1% lidocaine. Under real-time ultrasound guidance, the left basilic vein was accessed with a 21 gauge micropuncture needle; the needle tip within the vein was confirmed with ultrasound image documentation. Needle exchanged over a 018 guidewire for a peel-away sheath, through which a 5-French double-lumen power injectable PICC trimmed to 35cm was advanced, positioned with its tip near the cavoatrial junction. Spot chest radiograph confirms appropriate catheter position. Catheter was flushed per protocol and secured externally. The patient tolerated procedure well. COMPLICATIONS: COMPLICATIONS none IMPRESSION: 1. Technically successful five [place] double lumen power injectable PICC placement Electronically Signed   By: Lucrezia Europe M.D.   On: 01/06/2018 16:55   Irpicc Placement Left >5 Yrs Inc Img Guide  Result Date:  12/23/2017 INDICATION: 52 year old female with a history of DKA EXAM: PICC LINE PLACEMENT WITH ULTRASOUND AND FLUOROSCOPIC GUIDANCE MEDICATIONS: None ANESTHESIA/SEDATION: None FLUOROSCOPY  TIME:  Fluoroscopy Time: 0 minutes 42 seconds (1.9 mGy). COMPLICATIONS: None PROCEDURE: Informed written consent was obtained from the patient after a thorough discussion of the procedural risks, benefits and alternatives. All questions were addressed. Maximal Sterile Barrier Technique was utilized including caps, mask, sterile gowns, sterile gloves, sterile drape, hand hygiene and skin antiseptic. A timeout was performed prior to the initiation of the procedure. Patient was position in the supine position on the fluoroscopy table with the right arm abducted 90 degrees. Ultrasound survey of the upper extremity was performed with images stored and sent to PACs. The left brachial vein was selected for access. Once the patient was prepped and draped in the usual sterile fashion, the skin and subcutaneous tissues were generously infiltrated with 1% lidocaine for local anesthesia. A micropuncture access kit was then used to access the targeted vein. Wire was passed centrally, confirmed to be within the venous system under fluoroscopy. A small stab incision was made with an 11 blade scalpel and the sheath was then placed over the wire. Estimated length of the catheter was then performed with the indwelling wire. Catheter was amputated at 41 cm length and placed with coaxial wire through the peel-away. Double-lumen, power injectable PICC in the left brachial vein. Tip confirmed at the cavoatrial junction, and the catheter is ready for use. Stat lock was placed. Patient tolerated the procedure well and remained hemodynamically stable throughout. No complications were encountered and no significant blood loss was encountered. IMPRESSION: Status post left upper extremity PICC. Signed, Dulcy Fanny. Dellia Nims, RPVI [facility] PLAN: Anticipate future PICC placement to be difficult, with limited venous access and small caliber veins. Electronically Signed   By: Corrie Mckusick D.O.   On: 12/23/2017 17:33   Ir Picc Placement Right >5 Yrs Inc Img Guide  Result Date: 12/13/2017 CLINICAL DATA:  Diabetic ketoacidosis, poor peripheral IV access EXAM: PICC PLACEMENT WITH ULTRASOUND AND FLUOROSCOPY FLUOROSCOPY TIME:  1.3 minutes; 196 uGym2 DAP TECHNIQUE: After written informed consent was obtained, patient was placed in the supine position on angiographic table. Patency of the right brachial vein was confirmed with ultrasound with image documentation. An appropriate skin site was determined. Skin site was marked. Region was prepped using maximum barrier technique including cap and mask, sterile gown, sterile gloves, large sterile sheet, and Chlorhexidine as cutaneous antisepsis. The region was infiltrated locally with 1% lidocaine. Under real-time ultrasound guidance, the right brachial vein was accessed with a 21 gauge micropuncture needle; the needle tip within the vein was confirmed with ultrasound image documentation. Needle exchanged over a 018 guidewire for a peel-away sheath, through which a 5-French double-lumen power injectable PICC trimmed to 30cm was advanced, positioned with its tip near the cavoatrial junction. There was focal resistance in the central subclavian vein, overcome by using 2 parallel 018 guidewires. Spot chest radiograph confirms appropriate catheter position. Catheter was flushed per protocol and secured externally. The patient tolerated procedure well. COMPLICATIONS: COMPLICATIONS none IMPRESSION: 1. Technically successful five [place] double lumen power injectable PICC placement Electronically Signed   By: Lucrezia Europe M.D.   On: 12/13/2017 16:49   [place] Ekg Site Rite  Result Date: 01/05/2018 If Site Rite image not attached, placement could not be confirmed due to current cardiac rhythm.   I have independently reviewed the above radiologic studies and discussed  with the patient   Recent Lab Findings: Lab Results  Component Value Date   WBC 7.2  01/09/2018   HGB 9.5 (L) 01/09/2018   HCT 30.5 (L) 01/09/2018   PLT 290 01/09/2018   GLUCOSE 172 (H) 01/09/2018   CHOL 121 01/07/2018   TRIG 101 01/07/2018   HDL 47 01/07/2018   LDLCALC 54 01/07/2018   ALT 22 01/07/2018   AST 29 01/07/2018   NA 139 01/09/2018   K 3.0 (L) 01/09/2018   CL 104 01/09/2018   CREATININE 1.02 (H) 01/09/2018   BUN 7 01/09/2018   CO2 27 01/09/2018   TSH 0.632 03/25/2017   INR 1.27 01/04/2018   HGBA1C 8.6 (H) 01/08/2018      Assessment / Plan:      1. NSTEMI- peak troponin of 40.47.  She never had any chest pain.  Her only complaint was shortness of breath, nausea, and vomiting.  She underwent cardiac catheterization which showed multivessel coronary artery disease.  We have offered her coronary artery bypass surgery for tomorrow 01/10/2017   She remains on IV heparin. 2.  DKA-treated by primary care team.  Her recent CBGs are 156, 167, 154.  She is currently off her IV insulin.  Continue Levemir and sliding scale insulin. 3. AKI-creatinine is now trending down.  Avoid nephrotoxic medications. 4.  Hypertension-now well controlled on Lopressor 12.49m  twice daily 5.  Hyperlipidemia-continue Lipitor 6.  Depression-continue BuSpar 7.  Nausea/vomiting-continue Phenergan and Zofran as needed  I have discussed with the patient and her family the option of coronary artery bypass graft, with her poor medical condition she is at increased risk of any surgical intervention especially at risk of acute renal failure.  Her distal vessels are of poor quality but on review or probably bypassable.  Angioplasty is not an option with the small size of the vessels and diffuse disease.  I discussed with her family proceeding with coronary artery bypass grafting but it known increased risk.  She will likely require inpatient rehab following surgery.  We offered the patient the option to proceed with surgery tomorrow August 19.  She declined and became anxious about proceeding but  is agreeable to think it over and probably proceed on Tuesday, July 23 she needs to can.  She needs to continue with aggressive medical therapy with tight control of her glucose in the perioperative.     EGrace IsaacMD      [address] [place] [address]Office [phone]  BHull

## 2018-01-09 NOTE — Progress Notes (Signed)
Left Lower Extremity Vein Map    Left Great Saphenous Vein   Segment Diameter Comment  1. Origin 0.45 cm branch  2. High Thigh 0.28 cm   3. Mid Thigh 0.18 cm   4. Low Thigh 0.23 cm   5. At Knee 0.15 cm   6. High Calf 0.20   7. Low Calf 0.11 cm Difficult to see the native GSV due to muliple branches  8. Ankle 0.16

## 2018-01-09 NOTE — Progress Notes (Addendum)
ANTICOAGULATION CONSULT NOTE -  Follow Up Consult  Pharmacy Consult for IV heparin Indication: chest pain/ACS  Allergies  Allergen Reactions  . Anesthetics, Amide Nausea And Vomiting  . Penicillins Diarrhea, Nausea And Vomiting and Other (See Comments)    Has patient had a PCN reaction causing immediate rash, facial/tongue/throat swelling, SOB or lightheadedness with hypotension: Yes Has patient had a PCN reaction causing severe rash involving mucus membranes or skin necrosis: No Has patient had a PCN reaction that required hospitalization No Has patient had a PCN reaction occurring within the last 10 years: Yes  If all of the above answers are "NO", then may proceed with Cephalosporin use.   . Buprenorphine Hcl Rash  . Encainide Nausea And Vomiting  . Metoclopramide Other (See Comments)    Dystonia, muscle rigidity.     Patient Measurements: Height: 5\' 1"  (154.9 cm) Weight: 145 lb 11.6 oz (66.1 kg) IBW/kg (Calculated) : 47.8 Heparin Dosing Weight: 68 kg   Vital Signs: Temp: 98.7 F (37.1 C) (07/18 1148) Temp Source: Oral (07/18 1148) BP: 133/67 (07/18 1148) Pulse Rate: 77 (07/18 1148)  Labs: Recent Labs    01/07/18 0510 01/08/18 0500  01/08/18 2148 01/09/18 0505 01/09/18 0511 01/09/18 1326  HGB 11.9* 8.5*  --   --  9.5*  --   --   HCT 37.3 28.5*  --   --  30.5*  --   --   PLT 224 227  --   --  290  --   --   HEPARINUNFRC 0.11*  --    < > <0.10*  --  1.26* 0.43  CREATININE 0.95 0.88  --   --  1.02*  --   --    < > = values in this interval not displayed.    Estimated Creatinine Clearance: 57.4 mL/min (A) (by C-G formula based on SCr of 1.02 mg/dL (H)).   Medical History: Past Medical History:  Diagnosis Date  . Depression   . Diabetes mellitus without complication ([place])   . Essential hypertension   . Gastroparesis   . GERD (gastroesophageal reflux disease)   . HLD (hyperlipidemia)     Medications:  Scheduled:  . aspirin  81 mg Oral Daily  .  atorvastatin  80 mg Oral q1800  . busPIRone  10 mg Oral TID  . Chlorhexidine Gluconate Cloth  6 each Topical Daily  . fluticasone  2 spray Each Nare Daily  . [START ON 01/10/2018] heparin-papaverine-plasmalyte irrigation   Irrigation To OR  . insulin aspart  0-5 Units Subcutaneous QHS  . insulin aspart  0-9 Units Subcutaneous TID WC  . insulin detemir  8 Units Subcutaneous QHS  . [START ON 01/10/2018] magnesium sulfate  40 mEq Other To OR  . metoprolol tartrate  25 mg Oral BID  . [START ON 01/10/2018] potassium chloride  80 mEq Other To OR  . potassium chloride  40 mEq Oral Daily  . pyridostigmine  30 mg Oral TID WC  . sodium chloride flush  10-40 mL Intracatheter Q12H  . sodium chloride flush  3 mL Intravenous Q12H  . [START ON 01/10/2018] tranexamic acid  15 mg/kg Intravenous To OR  . [START ON 01/10/2018] tranexamic acid  2 mg/kg Intracatheter To OR    Assessment: 66 yoF admitted with ACS found to have severe 3vCAD. Pt to resume IV heparin while awaiting TCTS consult. Heparin level sporadic over the past 24 hours due to inability to get peripheral stick from lab draws. Heparin level drawn  appropriately from central line by RN this afternoon is therapeutic at 0.43. Of note, TCTS is currently evaluating the patient for CABG tomorrow.  Goal of Therapy:  Heparin level 0.3-0.7 units/ml Monitor platelets by anticoagulation protocol: Yes   Plan:  -Continue heparin 1050 units/hr -Will defer further heparin levels for now as infusion will be stopped on-call to OR in the morning  ADDENDUM: Pt still deciding on CABG with family, will reorder heparin level for am.  Arrie Senate, PharmD, BCPS Clinical Pharmacist [phone] Please check AMION for all [place] numbers 01/09/2018

## 2018-01-09 NOTE — Progress Notes (Signed)
Right Lower Extremity Vein Map    Right Great Saphenous Vein   Segment Diameter Comment  1. Origin 0.43 cm branching  2. High Thigh 0.23 cm   3. Mid Thigh 0.27 cm   4. Low Thigh 0.17 cm branching  5. At Knee 0.44 cm   6. High Calf  Not visualized  7. Low Calf 0.19 cm thrombosed  8. Ankle 0.16 cm

## 2018-01-10 LAB — BASIC METABOLIC PANEL
Anion gap: 7 (ref 5–15)
BUN: 7 mg/dL (ref 6–20)
CALCIUM: 8 mg/dL — AB (ref 8.9–10.3)
CO2: 27 mmol/L (ref 22–32)
CREATININE: 1.05 mg/dL — AB (ref 0.44–1.00)
Chloride: 104 mmol/L (ref 98–111)
GFR calc Af Amer: >60 mL/min (ref >60)
GLUCOSE: 208 mg/dL — AB (ref 70–99)
Potassium: 3 mmol/L — ABNORMAL LOW (ref 3.5–5.1)
Sodium: 138 mmol/L (ref 135–145)

## 2018-01-10 LAB — GLUCOSE, CAPILLARY
GLUCOSE-CAPILLARY: 174 mg/dL — AB (ref 70–99)
GLUCOSE-CAPILLARY: 211 mg/dL — AB (ref 70–99)
Glucose-Capillary: 144 mg/dL — ABNORMAL HIGH (ref 70–99)
Glucose-Capillary: 229 mg/dL — ABNORMAL HIGH (ref 70–99)
Glucose-Capillary: 245 mg/dL — ABNORMAL HIGH (ref 70–99)

## 2018-01-10 LAB — CBC
HCT: 26.4 % — ABNORMAL LOW (ref 36.0–46.0)
HCT: 27.3 % — ABNORMAL LOW (ref 36.0–46.0)
HEMOGLOBIN: 8.4 g/dL — AB (ref 12.0–15.0)
Hemoglobin: 8.1 g/dL — ABNORMAL LOW (ref 12.0–15.0)
MCH: 27.1 pg (ref 26.0–34.0)
MCH: 26.9 pg (ref 26.0–34.0)
MCHC: 30.7 g/dL (ref 30.0–36.0)
MCHC: 30.8 g/dL (ref 30.0–36.0)
MCV: 88.3 fL (ref 78.0–100.0)
MCV: 87.5 fL (ref 78.0–100.0)
PLATELETS: 220 10*3/uL (ref 150–400)
Platelets: 247 10*3/uL (ref 150–400)
RBC: 2.99 MIL/uL — AB (ref 3.87–5.11)
RBC: 3.12 MIL/uL — AB (ref 3.87–5.11)
RDW: 17.1 % — AB (ref 11.5–15.5)
RDW: 16.8 % — ABNORMAL HIGH (ref 11.5–15.5)
WBC: 4.3 10*3/uL (ref 4.0–10.5)
WBC: 4.7 10*3/uL (ref 4.0–10.5)

## 2018-01-10 LAB — MAGNESIUM
Magnesium: 1.6 mg/dL — ABNORMAL LOW (ref 1.7–2.4)
Magnesium: 1.7 mg/dL (ref 1.7–2.4)

## 2018-01-10 LAB — HEPARIN LEVEL (UNFRACTIONATED): HEPARIN UNFRACTIONATED: 0.37 [IU]/mL (ref 0.30–0.70)

## 2018-01-10 MED ORDER — PREGABALIN 75 MG PO CAPS
150.0000 mg | ORAL_CAPSULE | Freq: Every day | ORAL | Status: DC
  Administered 2018-01-10 – 2018-01-24 (×12): 150 mg via ORAL
  Filled 2018-01-10 (×14): qty 2

## 2018-01-10 MED ORDER — MAGNESIUM SULFATE 50 % IJ SOLN
3.0000 g | Freq: Once | INTRAVENOUS | Status: AC
  Administered 2018-01-10: 3 g via INTRAVENOUS
  Filled 2018-01-10: qty 6

## 2018-01-10 MED ORDER — INSULIN ASPART 100 UNIT/ML ~~LOC~~ SOLN
2.0000 [IU] | Freq: Three times a day (TID) | SUBCUTANEOUS | Status: DC
Start: 2018-01-10 — End: 2018-01-11
  Administered 2018-01-10 – 2018-01-11 (×3): 2 [IU] via SUBCUTANEOUS

## 2018-01-10 MED ORDER — TRAZODONE HCL 50 MG PO TABS
50.0000 mg | ORAL_TABLET | Freq: Once | ORAL | Status: AC
  Administered 2018-01-10: 50 mg via ORAL
  Filled 2018-01-10: qty 1

## 2018-01-10 MED ORDER — INSULIN DETEMIR 100 UNIT/ML ~~LOC~~ SOLN
11.0000 [IU] | Freq: Every day | SUBCUTANEOUS | Status: DC
  Administered 2018-01-10: 11 [IU] via SUBCUTANEOUS
  Filled 2018-01-10: qty 0.11

## 2018-01-10 MED ORDER — POTASSIUM CHLORIDE CRYS ER 20 MEQ PO TBCR
20.0000 meq | EXTENDED_RELEASE_TABLET | Freq: Once | ORAL | Status: AC
  Filled 2018-01-10: qty 1

## 2018-01-10 MED ORDER — PANTOPRAZOLE SODIUM 40 MG PO TBEC
40.0000 mg | DELAYED_RELEASE_TABLET | Freq: Every day | ORAL | Status: DC
  Administered 2018-01-10 – 2018-01-13 (×4): 40 mg via ORAL
  Filled 2018-01-10 (×4): qty 1

## 2018-01-10 NOTE — Care Management Note (Signed)
Case Management Note  Patient Details  Name: [name] MRN: [id number] Date of Birth: [birth year]  Subjective/Objective:    Pt admitted with DKA, also found to have NSTEMI              Action/Plan:  PTA active with [facility] for RN and PT - agency aware of admit.  CM requested resumption orders. CVTS consulted for multi vessel disease.  CM will continue to follow for discharge needs   Expected Discharge Date:  01/07/18               Expected Discharge Plan:  [place]  In-House Referral:     Discharge planning Services  CM Consult  Post Acute Care Choice:    Choice offered to:     DME Arranged:    DME Agency:     HH Arranged:    HH Agency:     Status of Service:  In process, will continue to follow  If discussed at Long Length of Stay Meetings, dates discussed:    Additional Comments: 01/10/2018 CABG planned for early next week Maryclare Labrador, RN 01/10/2018, 2:11 PM

## 2018-01-10 NOTE — Progress Notes (Signed)
Inpatient Diabetes Program Recommendations  AACE/ADA: New Consensus Statement on Inpatient Glycemic Control (2015)  Target Ranges:  Prepandial:   less than 140 mg/dL      Peak postprandial:   less than 180 mg/dL (1-2 hours)      Critically ill patients:  140 - 180 mg/dL   Results for PORSHEA, JANOWSKI (MRN [id number]) as of 01/10/2018 07:29  Ref. Range 01/09/2018 00:00 01/09/2018 04:10 01/09/2018 08:04 01/09/2018 12:54 01/09/2018 17:36 01/09/2018 20:18 01/09/2018 22:29 01/10/2018 00:06  Glucose-Capillary Latest Ref Range: 70 - 99 mg/dL 156 (H) 156 (H) 167 (H)  2 units NOVOLOG  154 (H)  2 units NOVOLOG  224 (H)  3 units NOVOLOG  286 (H) 285 (H)  3 units NOVOLOG +  8 units LEVEMIR  245 (H)   Results for RUPINDER, LIVINGSTON (MRN [id number]) as of 01/10/2018 07:29  Ref. Range 01/10/2018 05:00  Glucose Latest Ref Range: 70 - 99 mg/dL 208 (H)    Home DM Meds: Levemir 15 units daily       Novolog 0-12 units QID per SSI  Current Insulin Orders: Levemir 8 units QHS      Novolog Sensitive Correction Scale/ SSI (0-9 units) TID AC + HS     MD- Please consider the following in-hospital insulin adjustments:  1. Increase Levemir to 10 units QHS  2. Start low dose Novolog Meal Coverage: Novolog 2 units TID with meals (Please add the following Hold Parameters: Hold if pt eats <50% of meal, Hold if pt NPO)     --Will follow patient during hospitalization--  Wyn Quaker RN, MSN, CDE Diabetes Coordinator Inpatient Glycemic Control Team Team Pager: [phone] (8a-5p)

## 2018-01-10 NOTE — Evaluation (Signed)
Physical Therapy Evaluation Patient Details Name: [name] MRN: [id number] DOB: June 07, 1966 Today's Date: 01/10/2018   History of Present Illness  Pt is a 52 y.o. female with h/o uncontrolled DM type 1, admitted 01/04/18 with DKA; found to have NSTEMI. Cardiac cath 7/16 showed multivessel severe CAD. Planning for CABG. PMH includes HTN, DMI, anxiety, depression.    Clinical Impression  Pt presents with an overall decrease in functional mobility secondary to above. PTA, pt indep and lives with sister who works during the day; pt reports increasing fatigue and difficulty with functional mobility at home due to this. Pt with potential for CABG next week. Educ on sternal precautions ('move in the tube') and began practicing techniques to maintain these with mobility. Pt's main concern is that she will not have 24/7 support available upon return home; also has flight of steps to enter apartment. Pt would benefit from continued acute PT services to maximize functional mobility and independence prior to d/c.     Follow Up Recommendations Home health PT;Supervision - Intermittent    Equipment Recommendations  (TBD post-op)    Recommendations for Other Services       Precautions / Restrictions Precautions Precautions: Fall Restrictions Weight Bearing Restrictions: No      Mobility  Bed Mobility Overal bed mobility: Needs Assistance Bed Mobility: Rolling;Sidelying to Sit Rolling: Supervision Sidelying to sit: Min assist       General bed mobility comments: Significant increased time and effort  Transfers Overall transfer level: Needs assistance Equipment used: None Transfers: Sit to/from Stand Sit to Stand: Supervision         General transfer comment: Performed total of 8x sit<>stands with bilat hands on knees; good eccentric control. Supervision for safety/technique  Ambulation/Gait Ambulation/Gait assistance: Min guard Gait Distance (Feet): 150 Feet Assistive device:  IV Pole Gait Pattern/deviations: Step-through pattern;Decreased stride length;Trunk flexed Gait velocity: Decreased Gait velocity interpretation: 1.31 - 2.62 ft/sec, indicative of limited community ambulator General Gait Details: Slow amb with initially single UE support on IV pole, progressing to BUE support on IV pole due to c/o fatigue (pt declined use of RW). Min guard for balance  Stairs            Wheelchair Mobility    Modified Rankin (Stroke Patients Only)       Balance Overall balance assessment: Needs assistance   Sitting balance-Leahy Scale: Good       Standing balance-Leahy Scale: Fair                               Pertinent Vitals/Pain Pain Assessment: No/denies pain    Home Living Family/patient expects to be discharged to:: Private residence Living Arrangements: Other relatives Available Help at Discharge: Family;Available PRN/intermittently Type of Home: Apartment Home Access: Stairs to enter Entrance Stairs-Rails: Right Entrance Stairs-Number of Steps: 1 flight Home Layout: One level Home Equipment: None      Prior Function Level of Independence: Independent         Comments: Reports easily fatigued, limited ambulation distances     Hand Dominance        Extremity/Trunk Assessment   Upper Extremity Assessment Upper Extremity Assessment: Overall WFL for tasks assessed    Lower Extremity Assessment Lower Extremity Assessment: Generalized weakness    Cervical / Trunk Assessment Cervical / Trunk Assessment: Normal  Communication   Communication: No difficulties  Cognition Arousal/Alertness: Awake/alert Behavior During Therapy: Flat affect Overall Cognitive Status: No family/caregiver  present to determine baseline cognitive functioning Area of Impairment: Attention;Problem solving                   Current Attention Level: Selective         Problem Solving: Slow processing;Requires verbal cues         General Comments      Exercises     Assessment/Plan    PT Assessment Patient needs continued PT services  PT Problem List Decreased strength;Decreased activity tolerance;Decreased balance;Decreased mobility;Decreased knowledge of precautions;Cardiopulmonary status limiting activity       PT Treatment Interventions Functional mobility training;Therapeutic exercise;Therapeutic activities;Gait training;Balance training;Patient/family education;DME instruction;Stair training    PT Goals (Current goals can be found in the Care Plan section)  Acute Rehab PT Goals Patient Stated Goal: "Go for a walk" PT Goal Formulation: With patient Time For Goal Achievement: 01/24/18 Potential to Achieve Goals: Good    Frequency Min 3X/week   Barriers to discharge        Co-evaluation               AM-PAC PT "6 Clicks" Daily Activity  Outcome Measure Difficulty turning over in bed (including adjusting bedclothes, sheets and blankets)?: None Difficulty moving from lying on back to sitting on the side of the bed? : Unable Difficulty sitting down on and standing up from a chair with arms (e.g., wheelchair, bedside commode, etc,.)?: A Little Help needed moving to and from a bed to chair (including a wheelchair)?: A Little Help needed walking in hospital room?: A Little Help needed climbing 3-5 steps with a railing? : A Little 6 Click Score: 17    End of Session Equipment Utilized During Treatment: Gait belt Activity Tolerance: Patient tolerated treatment well Patient left: in chair;with call bell/phone within reach Nurse Communication: Mobility status PT Visit Diagnosis: Muscle weakness (generalized) (M62.81);Other abnormalities of gait and mobility (R26.89)    Time: 0177-9390 PT Time Calculation (min) (ACUTE ONLY): 43 min   Charges:   PT Evaluation $PT Eval Moderate Complexity: 1 Mod PT Treatments $Gait Training: 8-22 mins $Self Care/Home Management: 8-22   PT G Codes:        Mabeline Caras, PT, DPT Acute Rehab Services  Pager: [place] 01/10/2018, 3:12 PM

## 2018-01-10 NOTE — Progress Notes (Signed)
Progress Note  Patient Name: [name] Date of Encounter: 01/10/2018  Primary Cardiologist: Mertie Moores, MD   Subjective   Breathing is fair  No CP  Inpatient Medications    Scheduled Meds: . aspirin  81 mg Oral Daily  . atorvastatin  80 mg Oral q1800  . busPIRone  10 mg Oral TID  . Chlorhexidine Gluconate Cloth  6 each Topical Daily  . fluticasone  2 spray Each Nare Daily  . insulin aspart  0-5 Units Subcutaneous QHS  . insulin aspart  0-9 Units Subcutaneous TID WC  . insulin aspart  2 Units Subcutaneous TID WC  . insulin detemir  11 Units Subcutaneous QHS  . metoprolol tartrate  25 mg Oral BID  . potassium chloride  20 mEq Oral Once  . potassium chloride  40 mEq Oral Daily  . pregabalin  150 mg Oral Daily  . pyridostigmine  30 mg Oral TID WC  . sodium chloride flush  10-40 mL Intracatheter Q12H  . sodium chloride flush  3 mL Intravenous Q12H   Continuous Infusions: . sodium chloride    . heparin 1,050 Units/hr (01/10/18 0319)  . insulin (NOVOLIN-R) infusion     PRN Meds: sodium chloride, acetaminophen **OR** acetaminophen, diazepam, loperamide, ondansetron (ZOFRAN) IV, ondansetron **OR** [DISCONTINUED] ondansetron (ZOFRAN) IV, promethazine, sodium chloride flush, sodium chloride flush   Vital Signs    Vitals:   01/09/18 2253 01/10/18 0333 01/10/18 0755 01/10/18 0900  BP: (!) 111/51 128/66 137/77 134/60  Pulse: 86 78 89 84  Resp: (!) 24 20 19    Temp: (!) 97.4 F (36.3 C) 98.2 F (36.8 C) 99.2 F (37.3 C)   TempSrc: Oral Oral Oral   SpO2: 96% 93% 95%   Weight:      Height:        Intake/Output Summary (Last 24 hours) at 01/10/2018 0954 Last data filed at 01/10/2018 0646 Gross per 24 hour  Intake 374.35 ml  Output -  Net 374.35 ml   Filed Weights   01/05/18 2106  Weight: 145 lb 11.6 oz (66.1 kg)    Telemetry     NSR  - Personally Reviewed  ECG      Physical Exam   GEN: No acute distress.   Neck:  Neck full    Cardiac: RRR, no  murmurs, rubs, or gallops.  Respiratory: Rhonchi  Mild rales GI: Soft, nontender, non-distended  MS: No edema;   R groin soft   No bleeding   No murmurs  No deformity. Neuro:  Nonfocal  Psych: Normal affect   Labs    Chemistry Recent Labs  Lab 01/07/18 0510 01/08/18 0500 01/09/18 0505 01/10/18 0500  NA 139 140 139 138  K 3.2* 3.3* 3.0* 3.0*  CL 109 114* 104 104  CO2 24 22 27 27   GLUCOSE 221* 123* 172* 208*  BUN 13 8 7 7   CREATININE 0.95 0.88 1.02* 1.05*  CALCIUM 7.6* 7.3* 8.1* 8.0*  PROT 5.2*  --   --   --   ALBUMIN 2.4*  --   --   --   AST 29  --   --   --   ALT 22  --   --   --   ALKPHOS 99  --   --   --   BILITOT 0.6  --   --   --   GFRNONAA >60 >60 >60 >60  GFRAA >60 >60 >60 >60  ANIONGAP 6 4* 8 7  Hematology Recent Labs  Lab 01/08/18 0500 01/09/18 0505 01/10/18 0500  WBC 6.3 7.2 4.3  RBC 3.17* 3.50* 2.99*  HGB 8.5* 9.5* 8.1*  HCT 28.5* 30.5* 26.4*  MCV 89.9 87.1 88.3  MCH 26.8 27.1 27.1  MCHC 29.8* 31.1 30.7  RDW 17.3* 17.2* 17.1*  PLT 227 290 220    Cardiac Enzymes Recent Labs  Lab 01/04/18 1544 01/04/18 1825 01/05/18 0203 01/05/18 1901  TROPONINI 2.37* 10.60* 40.47* 10.24*    Recent Labs  Lab 01/04/18 1131  TROPIPOC 0.15*     BNPNo results for input(s): BNP, PROBNP in the last 168 hours.   DDimer No results for input(s): DDIMER in the last 168 hours.   Radiology    No results found.  Cardiac Studies  Cardiac Cath 01/07/18    Ost Ramus to Ramus lesion is 70% stenosed.  Ost LAD lesion is 40% stenosed.  Prox LAD lesion is 50% stenosed.  Prox LAD to Mid LAD lesion is 70% stenosed.  Prox Cx to Mid Cx lesion is 70% stenosed.  Mid Cx to Dist Cx lesion is 60% stenosed.  Mid RCA lesion is 85% stenosed.  Prox RCA lesion is 20% stenosed.  Dist RCA lesion is 50% stenosed.  RPDA lesion is 70% stenosed.   Multi-vessel CAD in this diabetic female with diffuse disease and diabetic appearing vessels.  There is smooth 40%  ostial LAD stenosis, diffuse 50% proximal LAD stenosis, significant eccentric calcification in the mid LAD after the diagonal vessel with narrowing of at least 70% and diffuse distal LAD narrowing; diffusely narrowed proximal to mid ramus intermediate vessel; large left circumflex coronary artery with 70% followed by 60% mid AV groove stenosis after the takeoff of the first marginal vessel; and small caliber RCA with diffuse disease with 20% proximal RCA narrowing,  focal 85% mid stenosis, diffuse 50% narrowing before the PDA takeoff, 70% mid PDA stenosis.  Moderately severe global LV dysfunction with an ejection fraction of 30 to 35%.  LVEDP 23 mm.  RECOMMENDATION: Angiograms will be reviewed with colleagues.  Recommend surgical consultation for consideration of CABG revascularization in this 52 year old diabetic female with a 41-year history of diabetes mellitus.  The patient's tightest lesion is her mid RCA but this would not account for her troponin of 40.47.  Increased medical therapy.  Hydrate post catheterization.  Will reinstitute heparin therapy for anticoagulation 10 hours post sheath removal.   Patient Profile     52 y.o. female   Assessment & Plan    1  CAD   Pt with signif 3 V CAD    CVTS has seen pt   Recomm surgery   Plan for Tuesday    ON heparin    2  HTN  BP is OK     3  Anemia   Pt Hgb 8.1   Was in 9s  (One value 11  ? Reliable)    Will add protonix   Follow  Closely     CBC later today   3  HL  LDL 50   On lipitor  5.  Hypokalemia   On daily K    Still low  Will check MG     For questions or updates, please contact [place] Please consult [email or web address] for contact info under Cardiology/STEMI.      Signed, Dorris Carnes, MD  01/10/2018, 9:54 AM

## 2018-01-10 NOTE — Progress Notes (Signed)
[facility]                                                                              Patient Demographics  [name], is a 52 y.o. female, DOB - 1965/09/20, QAS:[id number]  Admit date - 01/04/2018   Admitting Physician Cristal Ford, DO  Outpatient Primary MD for the patient is [name], MD  Outpatient specialists:   LOS - 6  days   Medical records reviewed and are as summarized below:    Chief Complaint  Patient presents with  . Hyperglycemia       Brief summary  [name] is a 52 y.o. femalewith a medical history ofdiabetes mellitus, medical noncompliance, depression. She presented with DKA, started on IV insulin and now found to have an NSTEMI. Started on heparin drip. DKA resolved.  Assessment & Plan    Principal Problem:   DKA (diabetic ketoacidoses) ([place]) with underlying history of uncontrolled type 1 diabetes mellitus -Patient has recurrent admissions for DKA, currently resolved, patient has been transitioned to subcutaneous insulin -CBGs now running elevated, fasting CBG 208, increased Levemir to 11 units at bedtime, added NovoLog 2 units 3 times daily AC, continue sliding scale insulin -Hemoglobin A1c 8.6  Active Problems:   Non-ST elevation (NSTEMI) myocardial infarction ([place]), troponin peaked at 40.47 -Currently no chest pain.  Underwent cardiac cath on 7/16 which showed multivessel severe coronary disease, recommended surgical consultation for CABG -2D echo showed EF of 55% with hypokinesis of inferior and apical walls -Continue IV heparin  -Appreciate CT VS consultation, patient declined CABG today however agreeable to think it over the weekend    Anxiety and depression Continue BuSpar    AKI (acute kidney injury) ([place]) - admitted with creatinine of 2.1, -Creatinine improved, IV fluids DC'd, monitor closely  Diarrhea -Improving, C. difficile negative  -Continue Lomotil as needed     essential hypertension -BP  stable, continue metoprolol 25 mg twice daily   Hypokalemia with hypomagnesemia -Placed on potassium supplementation, magnesium IV  Lactic acidosis with sepsis ruled out, -No source of infection, likely secondary to DKA and dehydration   Code Status full code DVT Prophylaxis: Heparin drip Family Communication: Discussed in detail with the patient, all imaging results, lab results explained to the patient     Disposition Plan: CABG next week  Time Spent in minutes   25 minutes   Procedures:  Cardiac cath on 7/16  Consultants:   Cardiology TCTS  Antimicrobials:      Medications  Scheduled Meds: . aspirin  81 mg Oral Daily  . atorvastatin  80 mg Oral q1800  . busPIRone  10 mg Oral TID  . Chlorhexidine Gluconate Cloth  6 each Topical Daily  . fluticasone  2 spray Each Nare Daily  . insulin aspart  0-5 Units Subcutaneous QHS  . insulin aspart  0-9 Units Subcutaneous TID WC  . insulin aspart  2 Units Subcutaneous TID WC  . insulin detemir  11 Units Subcutaneous QHS  . metoprolol tartrate  25 mg Oral BID  . pantoprazole  40 mg Oral Q1200  . potassium chloride  20 mEq Oral Once  .  potassium chloride  40 mEq Oral Daily  . pregabalin  150 mg Oral Daily  . pyridostigmine  30 mg Oral TID WC  . sodium chloride flush  10-40 mL Intracatheter Q12H  . sodium chloride flush  3 mL Intravenous Q12H   Continuous Infusions: . sodium chloride    . heparin 1,050 Units/hr (01/10/18 0319)  . insulin (NOVOLIN-R) infusion    . magnesium sulfate LVP 250-500 ml     PRN Meds:.sodium chloride, acetaminophen **OR** acetaminophen, diazepam, loperamide, ondansetron (ZOFRAN) IV, ondansetron **OR** [DISCONTINUED] ondansetron (ZOFRAN) IV, promethazine, sodium chloride flush, sodium chloride flush   Antibiotics   Anti-infectives (From admission, onward)   Start     Dose/Rate Route Frequency Ordered Stop   01/10/18 0400  vancomycin (VANCOCIN) 1,250 mg in sodium chloride 0.9 % 250 mL IVPB   Status:  Discontinued     1,250 mg 166.7 mL/hr over 90 Minutes Intravenous To Surgery 01/09/18 1311 01/09/18 1929   01/10/18 0400  levofloxacin (LEVAQUIN) IVPB 500 mg  Status:  Discontinued     500 mg 100 mL/hr over 60 Minutes Intravenous To Surgery 01/09/18 1311 01/09/18 1929        Subjective:   [name] was seen and examined today.  Feeling somewhat better today, tolerating diet without any difficulty.  States diarrhea is improving.  Overall in better spirits today however states that she felt overwhelmed with having surgery today and will think over the weekend.  Currently no chest pain, denied shortness of breath, abdominal pain, N/V/D/C, new weakness, numbess, tingling.  No fevers.  Objective:   Vitals:   01/10/18 0333 01/10/18 0755 01/10/18 0900 01/10/18 1133  BP: 128/66 137/77 134/60 126/65  Pulse: 78 89 84 72  Resp: _0 Temp: 98.2 F (36.8 C) 99.2 F (37.3 C)  98.2 F (36.8 C)  TempSrc: Oral Oral  Oral  SpO2: 93% 95%  100%  Weight:      Height:        Intake/Output Summary (Last 24 hours) at 01/10/2018 1311 Last data filed at 01/10/2018 1135 Gross per 24 hour  Intake 642.86 ml  Output 400 ml  Net 242.86 ml     Wt Readings from Last 3 Encounters:  01/05/18 66.1 kg (145 lb 11.6 oz)  12/23/17 68 kg (150 lb)  12/10/17 65 kg (143 lb 4.8 oz)     Exam   General: Alert and oriented x 3, NAD  Eyes:   HEENT:  Atraumatic, normocephalic  Cardiovascular: S1 S2 auscultated,  Regular rate and rhythm. No pedal edema b/l  Respiratory: Clear to auscultation bilaterally, no wheezing, rales or rhonchi  Gastrointestinal: Soft, nontender, nondistended, + bowel sounds  Ext: no pedal edema bilaterally  Neuro: no new deficits  Musculoskeletal: No digital cyanosis, clubbing  Skin: No rashes  Psych: Normal affect and demeanor, alert and oriented x3    Data Reviewed:  I have personally reviewed following labs and imaging studies  Micro  Results Recent Results (from the past 240 hour(s))  MRSA PCR Screening     Status: None   Collection Time: 01/04/18  3:41 PM  Result Value Ref Range Status   MRSA by PCR NEGATIVE NEGATIVE Final    Comment:        The GeneXpert MRSA Assay (FDA approved for NASAL specimens only), is one component of a comprehensive MRSA colonization surveillance program. It is not intended to diagnose MRSA infection nor to guide or monitor treatment for MRSA infections. Performed at [facility]  [place] [address], [place], [place] [address]   C difficile quick scan w PCR reflex     Status: None   Collection Time: 01/06/18  6:37 AM  Result Value Ref Range Status   C Diff antigen NEGATIVE NEGATIVE Final   C Diff toxin NEGATIVE NEGATIVE Final   C Diff interpretation No C. difficile detected.  Final    Comment: Performed at [facility], [place] [address], [place], [place] [address]    Radiology Reports Dg Abd 1 View  Result Date: 01/04/2018 CLINICAL DATA:  Onset of abdominal pain. EXAM: ABDOMEN - 1 VIEW COMPARISON:  12/23/2017 FINDINGS: Bowel gas pattern is nonobstructive. Again noted is gaseous distension of the stomach, raising the question of aerophagia. Surgical clips are noted in the RIGHT UPPER QUADRANT the abdomen. There is no evidence for free intraperitoneal air. IMPRESSION: 1. No evidence for acute abnormality. 2. Persistent gaseous distension of the stomach. Question of aerophagia. Electronically Signed   By: Nolon Nations M.D.   On: 01/04/2018 16:50   Dg Chest Port 1 View  Result Date: 01/07/2018 CLINICAL DATA:  PICC placement EXAM: PORTABLE CHEST 1 VIEW COMPARISON:  01/04/2018 FINDINGS: Left PICC line in place with the tip in the SVC. Patchy airspace disease bilaterally, right greater than left. Small right effusion. Heart is normal size. IMPRESSION: Worsening bilateral airspace disease, right greater than left concerning for multifocal pneumonia. Small  right effusion. Left PICC line tip in the lower SVC. Electronically Signed   By: Rolm Baptise M.D.   On: 01/07/2018 10:11   Dg Chest Portable 1 View  Result Date: 01/04/2018 CLINICAL DATA:  History of diabetes.  Tachypnea. EXAM: PORTABLE CHEST 1 VIEW COMPARISON:  December 23, 2017 FINDINGS: The heart size and mediastinal contours are within normal limits. Both lungs are clear. The visualized skeletal structures are unremarkable. IMPRESSION: No active disease. Electronically Signed   By: Dorise Bullion III M.D   On: 01/04/2018 11:39   Dg Abd Acute W/chest  Result Date: 12/23/2017 CLINICAL DATA:  Abdominal and back pain. Shortness of breath. History of gastroparesis. EXAM: DG ABDOMEN ACUTE W/ 1V CHEST COMPARISON:  Single-view of the chest 12/10/2017. Plain films chest and abdomen 11/22/2017. FINDINGS: Single-view of the chest demonstrates clear lungs and normal heart size. No pneumothorax or pleural effusion. Two views of the abdomen show no free intraperitoneal air. There is gaseous distention of the stomach. Small and large bowel are unremarkable. No unexpected abdominal calcification or focal bony abnormality. IMPRESSION: Gaseous distention of the stomach may be related to the patient's history of diabetic gastroparesis. The examination is otherwise negative. Electronically Signed   By: [name] Rise M.D.   On: 12/23/2017 19:01   Dg Abd Portable 1v  Result Date: 12/13/2017 CLINICAL DATA:  Nausea.  History of diabetes.  GERD, hypertension. EXAM: PORTABLE ABDOMEN - 1 VIEW COMPARISON:  11/22/2017 FINDINGS: Bowel gas pattern is nonobstructive. Moderate stool burden. Surgical clips are seen in the RIGHT UPPER QUADRANT. No organomegaly or abnormal calcifications. Visualized osseous structures have a normal appearance. IMPRESSION: Negative. Electronically Signed   By: Nolon Nations M.D.   On: 12/13/2017 08:37   Irpicc Placement Left >5 Yrs Inc Img Guide  Result Date: 01/06/2018 CLINICAL DATA:  STEMI,  poor venous access EXAM: PICC PLACEMENT WITH ULTRASOUND AND FLUOROSCOPY FLUOROSCOPY TIME:  0.1 minute; 34 uGym2 DAP TECHNIQUE: After written informed consent was obtained, patient was placed in the supine position on angiographic table. Patency of the left basilic  vein was confirmed with ultrasound with image documentation. An appropriate skin site was determined. Skin site was marked. Region was prepped using maximum barrier technique including cap and mask, sterile gown, sterile gloves, large sterile sheet, and Chlorhexidine as cutaneous antisepsis. The region was infiltrated locally with 1% lidocaine. Under real-time ultrasound guidance, the left basilic vein was accessed with a 21 gauge micropuncture needle; the needle tip within the vein was confirmed with ultrasound image documentation. Needle exchanged over a 018 guidewire for a peel-away sheath, through which a 5-French double-lumen power injectable PICC trimmed to 35cm was advanced, positioned with its tip near the cavoatrial junction. Spot chest radiograph confirms appropriate catheter position. Catheter was flushed per protocol and secured externally. The patient tolerated procedure well. COMPLICATIONS: COMPLICATIONS none IMPRESSION: 1. Technically successful five [place] double lumen power injectable PICC placement Electronically Signed   By: Lucrezia Europe M.D.   On: 01/06/2018 16:55   Irpicc Placement Left >5 Yrs Inc Img Guide  Result Date: 12/23/2017 INDICATION: 52 year old female with a history of DKA EXAM: PICC LINE PLACEMENT WITH ULTRASOUND AND FLUOROSCOPIC GUIDANCE MEDICATIONS: None ANESTHESIA/SEDATION: None FLUOROSCOPY TIME:  Fluoroscopy Time: 0 minutes 42 seconds (1.9 mGy). COMPLICATIONS: None PROCEDURE: Informed written consent was obtained from the patient after a thorough discussion of the procedural risks, benefits and alternatives. All questions were addressed. Maximal Sterile Barrier Technique was utilized including caps, mask, sterile  gowns, sterile gloves, sterile drape, hand hygiene and skin antiseptic. A timeout was performed prior to the initiation of the procedure. Patient was position in the supine position on the fluoroscopy table with the right arm abducted 90 degrees. Ultrasound survey of the upper extremity was performed with images stored and sent to PACs. The left brachial vein was selected for access. Once the patient was prepped and draped in the usual sterile fashion, the skin and subcutaneous tissues were generously infiltrated with 1% lidocaine for local anesthesia. A micropuncture access kit was then used to access the targeted vein. Wire was passed centrally, confirmed to be within the venous system under fluoroscopy. A small stab incision was made with an 11 blade scalpel and the sheath was then placed over the wire. Estimated length of the catheter was then performed with the indwelling wire. Catheter was amputated at 41 cm length and placed with coaxial wire through the peel-away. Double-lumen, power injectable PICC in the left brachial vein. Tip confirmed at the cavoatrial junction, and the catheter is ready for use. Stat lock was placed. Patient tolerated the procedure well and remained hemodynamically stable throughout. No complications were encountered and no significant blood loss was encountered. IMPRESSION: Status post left upper extremity PICC. Signed, Dulcy Fanny. Dellia Nims, RPVI [facility] Radiology PLAN: Anticipate future PICC placement to be difficult, with limited venous access and small caliber veins. Electronically Signed   By: Corrie Mckusick D.O.   On: 12/23/2017 17:33   Ir Picc Placement Right >5 Yrs Inc Img Guide  Result Date: 12/13/2017 CLINICAL DATA:  Diabetic ketoacidosis, poor peripheral IV access EXAM: PICC PLACEMENT WITH ULTRASOUND AND FLUOROSCOPY FLUOROSCOPY TIME:  1.3 minutes; 196 uGym2 DAP TECHNIQUE: After written informed consent was obtained,  patient was placed in the supine position on angiographic table. Patency of the right brachial vein was confirmed with ultrasound with image documentation. An appropriate skin site was determined. Skin site was marked. Region was prepped using maximum barrier technique including cap and mask, sterile gown, sterile gloves, large sterile sheet, and Chlorhexidine as cutaneous antisepsis.  The region was infiltrated locally with 1% lidocaine. Under real-time ultrasound guidance, the right brachial vein was accessed with a 21 gauge micropuncture needle; the needle tip within the vein was confirmed with ultrasound image documentation. Needle exchanged over a 018 guidewire for a peel-away sheath, through which a 5-French double-lumen power injectable PICC trimmed to 30cm was advanced, positioned with its tip near the cavoatrial junction. There was focal resistance in the central subclavian vein, overcome by using 2 parallel 018 guidewires. Spot chest radiograph confirms appropriate catheter position. Catheter was flushed per protocol and secured externally. The patient tolerated procedure well. COMPLICATIONS: COMPLICATIONS none IMPRESSION: 1. Technically successful five [place] double lumen power injectable PICC placement Electronically Signed   By: Lucrezia Europe M.D.   On: 12/13/2017 16:49   [place] Ekg Site Rite  Result Date: 01/05/2018 If Site Rite image not attached, placement could not be confirmed due to current cardiac rhythm.   Lab Data:  CBC: Recent Labs  Lab 01/06/18 0359 01/07/18 0510 01/08/18 0500 01/09/18 0505 01/10/18 0500  WBC 18.4* 4.9 6.3 7.2 4.3  HGB 10.0* 11.9* 8.5* 9.5* 8.1*  HCT 31.2* 37.3 28.5* 30.5* 26.4*  MCV 84.6 86.3 89.9 87.1 88.3  PLT 396 224 227 290 161   Basic Metabolic Panel: Recent Labs  Lab 01/06/18 0753 01/07/18 0510 01/08/18 0500 01/09/18 0505 01/10/18 0500  NA 144 139 140 139 138  K 4.2 3.2* 3.3* 3.0* 3.0*  CL 117* 109 114* 104 104  CO2 18* _0 GLUCOSE  59* 221* 123* 172* 208*  BUN _1 CREATININE 1.04* 0.95 0.88 1.02* 1.05*  CALCIUM 8.6* 7.6* 7.3* 8.1* 8.0*  MG  --   --   --   --  1.6*   GFR: Estimated Creatinine Clearance: 55.8 mL/min (A) (by C-G formula based on SCr of 1.05 mg/dL (H)). Liver Function Tests: Recent Labs  Lab 01/07/18 0510  AST 29  ALT 22  ALKPHOS 99  BILITOT 0.6  PROT 5.2*  ALBUMIN 2.4*   No results for input(s): LIPASE, AMYLASE in the last 168 hours. No results for input(s): AMMONIA in the last 168 hours. Coagulation Profile: Recent Labs  Lab 01/04/18 1825  INR 1.27   Cardiac Enzymes: Recent Labs  Lab 01/04/18 1544 01/04/18 1825 01/05/18 0203 01/05/18 1901  TROPONINI 2.37* 10.60* 40.47* 10.24*   BNP (last 3 results) No results for input(s): PROBNP in the last 8760 hours. HbA1C: Recent Labs    01/08/18 0500  HGBA1C 8.6*   CBG: Recent Labs  Lab 01/09/18 2018 01/09/18 2229 01/10/18 0006 01/10/18 0755 01/10/18 1257  GLUCAP 286* 285* 245* 174* 144*   Lipid Profile: No results for input(s): CHOL, HDL, LDLCALC, TRIG, CHOLHDL, LDLDIRECT in the last 72 hours. Thyroid Function Tests: No results for input(s): TSH, T4TOTAL, FREET4, T3FREE, THYROIDAB in the last 72 hours. Anemia Panel: No results for input(s): VITAMINB12, FOLATE, FERRITIN, TIBC, IRON, RETICCTPCT in the last 72 hours. Urine analysis:    Component Value Date/Time   COLORURINE YELLOW 01/06/2018 2008   APPEARANCEUR CLEAR 01/06/2018 2008   LABSPEC 1.015 01/06/2018 2008   PHURINE 6.0 01/06/2018 2008   GLUCOSEU >=500 (A) 01/06/2018 2008   HGBUR NEGATIVE 01/06/2018 2008   BILIRUBINUR NEGATIVE 01/06/2018 2008   BILIRUBINUR neg 01/17/2017 1533   KETONESUR 5 (A) 01/06/2018 2008   PROTEINUR 30 (A) 01/06/2018 2008   UROBILINOGEN 0.2 01/17/2017 1533   UROBILINOGEN 0.2 04/27/2015 1608   NITRITE NEGATIVE 01/06/2018 2008  LEUKOCYTESUR NEGATIVE 01/06/2018 2008     Ripudeep Rai M.D. [facility] 01/10/2018, 1:11  PM  Pager: [phone] Between 7am to 7pm - call Pager - [phone]  After 7pm go to [email or web address] - password [email or web address]  Call night coverage person covering after 7pm

## 2018-01-10 NOTE — Progress Notes (Addendum)
ANTICOAGULATION CONSULT NOTE -  Follow Up Consult  Pharmacy Consult for IV heparin Indication: chest pain/ACS  Allergies  Allergen Reactions  . Anesthetics, Amide Nausea And Vomiting  . Penicillins Diarrhea, Nausea And Vomiting and Other (See Comments)    Has patient had a PCN reaction causing immediate rash, facial/tongue/throat swelling, SOB or lightheadedness with hypotension: Yes Has patient had a PCN reaction causing severe rash involving mucus membranes or skin necrosis: No Has patient had a PCN reaction that required hospitalization No Has patient had a PCN reaction occurring within the last 10 years: Yes  If all of the above answers are "NO", then may proceed with Cephalosporin use.   . Buprenorphine Hcl Rash  . Encainide Nausea And Vomiting  . Metoclopramide Other (See Comments)    Dystonia, muscle rigidity.     Patient Measurements: Height: 5\' 1"  (154.9 cm) Weight: 145 lb 11.6 oz (66.1 kg) IBW/kg (Calculated) : 47.8 Heparin Dosing Weight: 68 kg   Vital Signs: Temp: 98.2 F (36.8 C) (07/19 0333) Temp Source: Oral (07/19 0333) BP: 128/66 (07/19 0333) Pulse Rate: 78 (07/19 0333)  Labs: Recent Labs    01/08/18 0500  01/09/18 0505 01/09/18 0511 01/09/18 1326 01/10/18 0454 01/10/18 0500  HGB 8.5*  --  9.5*  --   --   --  8.1*  HCT 28.5*  --  30.5*  --   --   --  26.4*  PLT 227  --  290  --   --   --  220  HEPARINUNFRC  --    < >  --  1.26* 0.43 0.37  --   CREATININE 0.88  --  1.02*  --   --   --  1.05*   < > = values in this interval not displayed.    Estimated Creatinine Clearance: 55.8 mL/min (A) (by C-G formula based on SCr of 1.05 mg/dL (H)).   Medical History: Past Medical History:  Diagnosis Date  . Depression   . Diabetes mellitus without complication ([place])   . Essential hypertension   . Gastroparesis   . GERD (gastroesophageal reflux disease)   . HLD (hyperlipidemia)     Medications:  Scheduled:  . aspirin  81 mg Oral Daily  .  atorvastatin  80 mg Oral q1800  . busPIRone  10 mg Oral TID  . Chlorhexidine Gluconate Cloth  6 each Topical Daily  . fluticasone  2 spray Each Nare Daily  . insulin aspart  0-5 Units Subcutaneous QHS  . insulin aspart  0-9 Units Subcutaneous TID WC  . insulin detemir  8 Units Subcutaneous QHS  . metoprolol tartrate  25 mg Oral BID  . potassium chloride  40 mEq Oral Daily  . pyridostigmine  30 mg Oral TID WC  . sodium chloride flush  10-40 mL Intracatheter Q12H  . sodium chloride flush  3 mL Intravenous Q12H    Assessment: 25 yoF admitted with ACS found to have severe 3vCAD. Pt to resume IV heparin while awaiting TCTS consult. Heparin level sporadic over the past 24 hours due to inability to get peripheral stick from lab draws.   Heparin level is therapeutic 0.37, on 1050 units/hr. Hgb 8.1, plt 220. No s/sx of bleeding. Of note, TCTS is planning for CABG on Tuesday on 7/23. No infusion issues.  Goal of Therapy:  Heparin level 0.3-0.7 units/ml Monitor platelets by anticoagulation protocol: Yes   Plan:  -Continue heparin 1050 units/hr -Monitor daily heparin levels, CBC, and for s/sx of bleeding -  F/u plans for CABG  Doylene Canard, PharmD Clinical Pharmacist  Pager: [phone] Phone: [phone] 01/10/2018

## 2018-01-11 DIAGNOSIS — I2510 Atherosclerotic heart disease of native coronary artery without angina pectoris: Secondary | ICD-10-CM

## 2018-01-11 HISTORY — DX: Atherosclerotic heart disease of native coronary artery without angina pectoris: I25.10

## 2018-01-11 LAB — CBC
HEMATOCRIT: 25 % — AB (ref 36.0–46.0)
HEMOGLOBIN: 7.6 g/dL — AB (ref 12.0–15.0)
MCH: 27.2 pg (ref 26.0–34.0)
MCHC: 30.4 g/dL (ref 30.0–36.0)
MCV: 89.6 fL (ref 78.0–100.0)
Platelets: 231 10*3/uL (ref 150–400)
RBC: 2.79 MIL/uL — AB (ref 3.87–5.11)
RDW: 16.6 % — ABNORMAL HIGH (ref 11.5–15.5)
WBC: 4.2 10*3/uL (ref 4.0–10.5)

## 2018-01-11 LAB — BASIC METABOLIC PANEL
Anion gap: 5 (ref 5–15)
BUN: 8 mg/dL (ref 6–20)
CHLORIDE: 107 mmol/L (ref 98–111)
CO2: 25 mmol/L (ref 22–32)
Calcium: 8.1 mg/dL — ABNORMAL LOW (ref 8.9–10.3)
Creatinine, Ser: 1.04 mg/dL — ABNORMAL HIGH (ref 0.44–1.00)
GFR calc Af Amer: >60 mL/min (ref >60)
GFR calc non Af Amer: >60 mL/min (ref >60)
Glucose, Bld: 262 mg/dL — ABNORMAL HIGH (ref 70–99)
POTASSIUM: 3.5 mmol/L (ref 3.5–5.1)
SODIUM: 137 mmol/L (ref 135–145)

## 2018-01-11 LAB — GLUCOSE, CAPILLARY
GLUCOSE-CAPILLARY: 246 mg/dL — AB (ref 70–99)
GLUCOSE-CAPILLARY: 140 mg/dL — AB (ref 70–99)
Glucose-Capillary: 211 mg/dL — ABNORMAL HIGH (ref 70–99)
Glucose-Capillary: 263 mg/dL — ABNORMAL HIGH (ref 70–99)

## 2018-01-11 LAB — HEPARIN LEVEL (UNFRACTIONATED): HEPARIN UNFRACTIONATED: 0.44 [IU]/mL (ref 0.30–0.70)

## 2018-01-11 MED ORDER — INSULIN DETEMIR 100 UNIT/ML ~~LOC~~ SOLN
15.0000 [IU] | Freq: Every day | SUBCUTANEOUS | Status: DC
  Administered 2018-01-11: 15 [IU] via SUBCUTANEOUS
  Filled 2018-01-11: qty 0.15

## 2018-01-11 MED ORDER — INSULIN ASPART 100 UNIT/ML ~~LOC~~ SOLN
4.0000 [IU] | Freq: Three times a day (TID) | SUBCUTANEOUS | Status: DC
  Administered 2018-01-11 – 2018-01-12 (×2): 4 [IU] via SUBCUTANEOUS

## 2018-01-11 NOTE — Progress Notes (Signed)
ANTICOAGULATION CONSULT NOTE -  Follow Up Consult  Pharmacy Consult for IV heparin Indication: chest pain/ACS  Allergies  Allergen Reactions  . Anesthetics, Amide Nausea And Vomiting  . Penicillins Diarrhea, Nausea And Vomiting and Other (See Comments)    Has patient had a PCN reaction causing immediate rash, facial/tongue/throat swelling, SOB or lightheadedness with hypotension: Yes Has patient had a PCN reaction causing severe rash involving mucus membranes or skin necrosis: No Has patient had a PCN reaction that required hospitalization No Has patient had a PCN reaction occurring within the last 10 years: Yes  If all of the above answers are "NO", then may proceed with Cephalosporin use.   . Buprenorphine Hcl Rash  . Encainide Nausea And Vomiting  . Metoclopramide Other (See Comments)    Dystonia, muscle rigidity.     Patient Measurements: Height: 5\' 1"  (154.9 cm) Weight: 145 lb 11.6 oz (66.1 kg) IBW/kg (Calculated) : 47.8 Heparin Dosing Weight: 68 kg   Vital Signs: Temp: 98.7 F (37.1 C) (07/20 0733) Temp Source: Oral (07/20 0733) BP: 128/65 (07/20 0733) Pulse Rate: 79 (07/20 0733)  Labs: Recent Labs    01/09/18 0505  01/09/18 1326 01/10/18 0454 01/10/18 0500 01/10/18 1417 01/11/18 0413  HGB 9.5*  --   --   --  8.1* 8.4* 7.6*  HCT 30.5*  --   --   --  26.4* 27.3* 25.0*  PLT 290  --   --   --  220 247 231  HEPARINUNFRC  --    < > 0.43 0.37  --   --  0.44  CREATININE 1.02*  --   --   --  1.05*  --  1.04*   < > = values in this interval not displayed.   Estimated Creatinine Clearance: 56.3 mL/min (A) (by C-G formula based on SCr of 1.04 mg/dL (H)).  Medical History: Past Medical History:  Diagnosis Date  . Depression   . Diabetes mellitus without complication ([place])   . Essential hypertension   . Gastroparesis   . GERD (gastroesophageal reflux disease)   . HLD (hyperlipidemia)    Medications:  Scheduled:  . aspirin  81 mg Oral Daily  . atorvastatin  80  mg Oral q1800  . busPIRone  10 mg Oral TID  . Chlorhexidine Gluconate Cloth  6 each Topical Daily  . fluticasone  2 spray Each Nare Daily  . insulin aspart  0-5 Units Subcutaneous QHS  . insulin aspart  0-9 Units Subcutaneous TID WC  . insulin aspart  2 Units Subcutaneous TID WC  . insulin detemir  11 Units Subcutaneous QHS  . metoprolol tartrate  25 mg Oral BID  . pantoprazole  40 mg Oral Q1200  . potassium chloride  40 mEq Oral Daily  . pregabalin  150 mg Oral Daily  . pyridostigmine  30 mg Oral TID WC  . sodium chloride flush  10-40 mL Intracatheter Q12H  . sodium chloride flush  3 mL Intravenous Q12H   Assessment: 47 yoF admitted with ACS found to have severe 3vCAD. Pt to resume IV heparin while awaiting TCTS surgery. Heparin level is therapeutic today at 0.44 and no noted acute bleeding.  She does have some anemia though (7.6/25.0) down from yesterday and down from 11.9 on 7/16.    Noted iron stores low back in May of this year and no iron supplementation on her medication list.  Goal of Therapy:  Heparin level 0.3-0.7 units/ml Monitor platelets by anticoagulation protocol: Yes  Plan:  -Continue heparin 1050 units/hr - will strive for lower end given her anemia. -Monitor daily heparin levels, CBC, and for s/sx of bleeding -F/u plans for CABG - Consider iron supplementation if you feel appropriate.  Rober Minion, PharmD., MS Clinical Pharmacist Pager:  [phone] Thank you for allowing pharmacy to be part of this patients care team. 01/11/2018

## 2018-01-11 NOTE — Progress Notes (Signed)
[facility]                                                                              Patient Demographics  [name], is a 52 y.o. female, DOB - 1966/04/29, HGD:[id number]  Admit date - 01/04/2018   Admitting Physician Cristal Ford, DO  Outpatient Primary MD for the patient is [name], MD  Outpatient specialists:   LOS - 7  days   Medical records reviewed and are as summarized below:    Chief Complaint  Patient presents with  . Hyperglycemia       Brief summary  [name] is a 52 y.o. femalewith a medical history ofdiabetes mellitus, medical noncompliance, depression. She presented with DKA, started on IV insulin and now found to have an NSTEMI. Started on heparin drip. DKA resolved.  Assessment & Plan    Principal Problem:   DKA (diabetic ketoacidoses) ([place]) with underlying history of uncontrolled type 1 diabetes mellitus -Patient has recurrent admissions for DKA, currently resolved, patient has been transitioned to subcutaneous insulin -CBG still elevated, fasting CBG 262, increase Levemir to 15 units nightly, increased NovoLog meal coverage to 4 units 3 times daily AC, continue sliding scale insulin  -Hemoglobin A1c 8.6  Active Problems:   Non-ST elevation (NSTEMI) myocardial infarction ([place]), troponin peaked at 40.47 -Currently no chest pain.  Underwent cardiac cath on 7/16 which showed multivessel severe coronary disease, recommended surgical consultation for CABG -2D echo showed EF of 55% with hypokinesis of inferior and apical walls -Continue IV heparin  -Appreciate CT VS consultation, plan for CABG early next week    Anxiety and depression Continue BuSpar    AKI (acute kidney injury) ([place]) - admitted with creatinine of 2.1, -Creatinine at baseline 1.0  Diarrhea -Resolved per patient, C. difficile negative  -Continue Lomotil as needed     essential hypertension -BP stable, continue metoprolol 25 mg twice daily    Hypokalemia with hypomagnesemia -Potassium improved with supplementation  Lactic acidosis with sepsis ruled out, -No source of infection, likely secondary to DKA and dehydration   Code Status full code DVT Prophylaxis: Heparin drip Family Communication: Discussed in detail with the patient, all imaging results, lab results explained to the patient     Disposition Plan: CABG next week  Time Spent in minutes   25 minutes   Procedures:  Cardiac cath on 7/16  Consultants:   Cardiology TCTS  Antimicrobials:      Medications  Scheduled Meds: . aspirin  81 mg Oral Daily  . atorvastatin  80 mg Oral q1800  . busPIRone  10 mg Oral TID  . Chlorhexidine Gluconate Cloth  6 each Topical Daily  . fluticasone  2 spray Each Nare Daily  . insulin aspart  0-5 Units Subcutaneous QHS  . insulin aspart  0-9 Units Subcutaneous TID WC  . insulin aspart  2 Units Subcutaneous TID WC  . insulin detemir  11 Units Subcutaneous QHS  . metoprolol tartrate  25 mg Oral BID  . pantoprazole  40 mg Oral Q1200  . potassium chloride  40 mEq Oral Daily  . pregabalin  150 mg Oral Daily  .  pyridostigmine  30 mg Oral TID WC  . sodium chloride flush  10-40 mL Intracatheter Q12H  . sodium chloride flush  3 mL Intravenous Q12H   Continuous Infusions: . sodium chloride    . heparin 1,050 Units/hr (01/10/18 2156)   PRN Meds:.sodium chloride, acetaminophen **OR** acetaminophen, diazepam, loperamide, ondansetron (ZOFRAN) IV, ondansetron **OR** [DISCONTINUED] ondansetron (ZOFRAN) IV, promethazine, sodium chloride flush, sodium chloride flush   Antibiotics   Anti-infectives (From admission, onward)   Start     Dose/Rate Route Frequency Ordered Stop   01/10/18 0400  vancomycin (VANCOCIN) 1,250 mg in sodium chloride 0.9 % 250 mL IVPB  Status:  Discontinued     1,250 mg 166.7 mL/hr over 90 Minutes Intravenous To Surgery 01/09/18 1311 01/09/18 1929   01/10/18 0400  levofloxacin (LEVAQUIN) IVPB 500 mg   Status:  Discontinued     500 mg 100 mL/hr over 60 Minutes Intravenous To Surgery 01/09/18 1311 01/09/18 1929        Subjective:   [name] was seen and examined today.  Feeling better, no new complaints.  Eating breakfast. Currently no chest pain, denied shortness of breath, abdominal pain, N/V/D/C, new weakness, numbess, tingling.  No fevers.  Objective:   Vitals:   01/11/18 0733 01/11/18 1037 01/11/18 1050 01/11/18 1217  BP: 128/65 (!) 104/48 (!) 119/56 (!) 115/54  Pulse: 79  75 75  Resp: 19 19 (!) 21 (!) 29  Temp: 98.7 F (37.1 C)  97.9 F (36.6 C) 97.9 F (36.6 C)  TempSrc: Oral  Oral Oral  SpO2: 93%  98% 98%  Weight:      Height:        Intake/Output Summary (Last 24 hours) at 01/11/2018 1233 Last data filed at 01/11/2018 1200 Gross per 24 hour  Intake 1399 ml  Output 450 ml  Net 949 ml     Wt Readings from Last 3 Encounters:  01/05/18 66.1 kg (145 lb 11.6 oz)  12/23/17 68 kg (150 lb)  12/10/17 65 kg (143 lb 4.8 oz)     Exam    General: Alert and oriented x 3, NAD  Eyes:   HEENT:  Atraumatic, normocephalic  Cardiovascular: S1 S2 auscultated, Regular rate and rhythm. No pedal edema b/l  Respiratory: CTA B  Gastrointestinal: Soft, nontender, nondistended, + bowel sounds  Ext: no pedal edema bilaterally  Neuro: no new deficits  Musculoskeletal: No digital cyanosis, clubbing  Skin: No rashes  Psych: Normal affect and demeanor, alert and oriented x3   Data Reviewed:  I have personally reviewed following labs and imaging studies  Micro Results Recent Results (from the past 240 hour(s))  MRSA PCR Screening     Status: None   Collection Time: 01/04/18  3:41 PM  Result Value Ref Range Status   MRSA by PCR NEGATIVE NEGATIVE Final    Comment:        The GeneXpert MRSA Assay (FDA approved for NASAL specimens only), is one component of a comprehensive MRSA colonization surveillance program. It is not intended to diagnose MRSA infection  nor to guide or monitor treatment for MRSA infections. Performed at [facility], [place] [address], [place] [address], [place] [address]   C difficile quick scan w PCR reflex     Status: None   Collection Time: 01/06/18  6:37 AM  Result Value Ref Range Status   C Diff antigen NEGATIVE NEGATIVE Final   C Diff toxin NEGATIVE NEGATIVE Final   C Diff interpretation No C. difficile detected.  Final    Comment: Performed at [facility], [place] [address], [place], [place] [address]    Radiology Reports Dg Abd 1 View  Result Date: 01/04/2018 CLINICAL DATA:  Onset of abdominal pain. EXAM: ABDOMEN - 1 VIEW COMPARISON:  12/23/2017 FINDINGS: Bowel gas pattern is nonobstructive. Again noted is gaseous distension of the stomach, raising the question of aerophagia. Surgical clips are noted in the RIGHT UPPER QUADRANT the abdomen. There is no evidence for free intraperitoneal air. IMPRESSION: 1. No evidence for acute abnormality. 2. Persistent gaseous distension of the stomach. Question of aerophagia. Electronically Signed   By: Nolon Nations M.D.   On: 01/04/2018 16:50   Dg Chest Port 1 View  Result Date: 01/07/2018 CLINICAL DATA:  PICC placement EXAM: PORTABLE CHEST 1 VIEW COMPARISON:  01/04/2018 FINDINGS: Left PICC line in place with the tip in the SVC. Patchy airspace disease bilaterally, right greater than left. Small right effusion. Heart is normal size. IMPRESSION: Worsening bilateral airspace disease, right greater than left concerning for multifocal pneumonia. Small right effusion. Left PICC line tip in the lower SVC. Electronically Signed   By: Rolm Baptise M.D.   On: 01/07/2018 10:11   Dg Chest Portable 1 View  Result Date: 01/04/2018 CLINICAL DATA:  History of diabetes.  Tachypnea. EXAM: PORTABLE CHEST 1 VIEW COMPARISON:  December 23, 2017 FINDINGS: The heart size and mediastinal contours are within normal limits. Both lungs are clear. The visualized skeletal structures  are unremarkable. IMPRESSION: No active disease. Electronically Signed   By: Dorise Bullion III M.D   On: 01/04/2018 11:39   Dg Abd Acute W/chest  Result Date: 12/23/2017 CLINICAL DATA:  Abdominal and back pain. Shortness of breath. History of gastroparesis. EXAM: DG ABDOMEN ACUTE W/ 1V CHEST COMPARISON:  Single-view of the chest 12/10/2017. Plain films chest and abdomen 11/22/2017. FINDINGS: Single-view of the chest demonstrates clear lungs and normal heart size. No pneumothorax or pleural effusion. Two views of the abdomen show no free intraperitoneal air. There is gaseous distention of the stomach. Small and large bowel are unremarkable. No unexpected abdominal calcification or focal bony abnormality. IMPRESSION: Gaseous distention of the stomach may be related to the patient's history of diabetic gastroparesis. The examination is otherwise negative. Electronically Signed   By: [name] Rise M.D.   On: 12/23/2017 19:01   Dg Abd Portable 1v  Result Date: 12/13/2017 CLINICAL DATA:  Nausea.  History of diabetes.  GERD, hypertension. EXAM: PORTABLE ABDOMEN - 1 VIEW COMPARISON:  11/22/2017 FINDINGS: Bowel gas pattern is nonobstructive. Moderate stool burden. Surgical clips are seen in the RIGHT UPPER QUADRANT. No organomegaly or abnormal calcifications. Visualized osseous structures have a normal appearance. IMPRESSION: Negative. Electronically Signed   By: Nolon Nations M.D.   On: 12/13/2017 08:37   Irpicc Placement Left >5 Yrs Inc Img Guide  Result Date: 01/06/2018 CLINICAL DATA:  STEMI, poor venous access EXAM: PICC PLACEMENT WITH ULTRASOUND AND FLUOROSCOPY FLUOROSCOPY TIME:  0.1 minute; 34 uGym2 DAP TECHNIQUE: After written informed consent was obtained, patient was placed in the supine position on angiographic table. Patency of the left basilic vein was confirmed with ultrasound with image documentation. An appropriate skin site was determined. Skin site was marked. Region was prepped using  maximum barrier technique including cap and mask, sterile gown, sterile gloves, large sterile sheet, and Chlorhexidine as cutaneous antisepsis. The region was infiltrated locally with 1% lidocaine. Under real-time ultrasound guidance, the left basilic vein was accessed with a 21 gauge micropuncture needle;  the needle tip within the vein was confirmed with ultrasound image documentation. Needle exchanged over a 018 guidewire for a peel-away sheath, through which a 5-French double-lumen power injectable PICC trimmed to 35cm was advanced, positioned with its tip near the cavoatrial junction. Spot chest radiograph confirms appropriate catheter position. Catheter was flushed per protocol and secured externally. The patient tolerated procedure well. COMPLICATIONS: COMPLICATIONS none IMPRESSION: 1. Technically successful five [place] double lumen power injectable PICC placement Electronically Signed   By: Lucrezia Europe M.D.   On: 01/06/2018 16:55   Irpicc Placement Left >5 Yrs Inc Img Guide  Result Date: 12/23/2017 INDICATION: 52 year old female with a history of DKA EXAM: PICC LINE PLACEMENT WITH ULTRASOUND AND FLUOROSCOPIC GUIDANCE MEDICATIONS: None ANESTHESIA/SEDATION: None FLUOROSCOPY TIME:  Fluoroscopy Time: 0 minutes 42 seconds (1.9 mGy). COMPLICATIONS: None PROCEDURE: Informed written consent was obtained from the patient after a thorough discussion of the procedural risks, benefits and alternatives. All questions were addressed. Maximal Sterile Barrier Technique was utilized including caps, mask, sterile gowns, sterile gloves, sterile drape, hand hygiene and skin antiseptic. A timeout was performed prior to the initiation of the procedure. Patient was position in the supine position on the fluoroscopy table with the right arm abducted 90 degrees. Ultrasound survey of the upper extremity was performed with images stored and sent to PACs. The left brachial vein was selected for access. Once the patient was prepped  and draped in the usual sterile fashion, the skin and subcutaneous tissues were generously infiltrated with 1% lidocaine for local anesthesia. A micropuncture access kit was then used to access the targeted vein. Wire was passed centrally, confirmed to be within the venous system under fluoroscopy. A small stab incision was made with an 11 blade scalpel and the sheath was then placed over the wire. Estimated length of the catheter was then performed with the indwelling wire. Catheter was amputated at 41 cm length and placed with coaxial wire through the peel-away. Double-lumen, power injectable PICC in the left brachial vein. Tip confirmed at the cavoatrial junction, and the catheter is ready for use. Stat lock was placed. Patient tolerated the procedure well and remained hemodynamically stable throughout. No complications were encountered and no significant blood loss was encountered. IMPRESSION: Status post left upper extremity PICC. Signed, Dulcy Fanny. Dellia Nims, RPVI [facility] PLAN: Anticipate future PICC placement to be difficult, with limited venous access and small caliber veins. Electronically Signed   By: Corrie Mckusick D.O.   On: 12/23/2017 17:33   Ir Picc Placement Right >5 Yrs Inc Img Guide  Result Date: 12/13/2017 CLINICAL DATA:  Diabetic ketoacidosis, poor peripheral IV access EXAM: PICC PLACEMENT WITH ULTRASOUND AND FLUOROSCOPY FLUOROSCOPY TIME:  1.3 minutes; 196 uGym2 DAP TECHNIQUE: After written informed consent was obtained, patient was placed in the supine position on angiographic table. Patency of the right brachial vein was confirmed with ultrasound with image documentation. An appropriate skin site was determined. Skin site was marked. Region was prepped using maximum barrier technique including cap and mask, sterile gown, sterile gloves, large sterile sheet, and Chlorhexidine as cutaneous antisepsis. The region was infiltrated  locally with 1% lidocaine. Under real-time ultrasound guidance, the right brachial vein was accessed with a 21 gauge micropuncture needle; the needle tip within the vein was confirmed with ultrasound image documentation. Needle exchanged over a 018 guidewire for a peel-away sheath, through which a 5-French double-lumen power injectable PICC trimmed to 30cm was advanced, positioned with its tip near the  cavoatrial junction. There was focal resistance in the central subclavian vein, overcome by using 2 parallel 018 guidewires. Spot chest radiograph confirms appropriate catheter position. Catheter was flushed per protocol and secured externally. The patient tolerated procedure well. COMPLICATIONS: COMPLICATIONS none IMPRESSION: 1. Technically successful five [place] double lumen power injectable PICC placement Electronically Signed   By: Lucrezia Europe M.D.   On: 12/13/2017 16:49   [place] Ekg Site Rite  Result Date: 01/05/2018 If Site Rite image not attached, placement could not be confirmed due to current cardiac rhythm.   Lab Data:  CBC: Recent Labs  Lab 01/08/18 0500 01/09/18 0505 01/10/18 0500 01/10/18 1417 01/11/18 0413  WBC 6.3 7.2 4.3 4.7 4.2  HGB 8.5* 9.5* 8.1* 8.4* 7.6*  HCT 28.5* 30.5* 26.4* 27.3* 25.0*  MCV 89.9 87.1 88.3 87.5 89.6  PLT 227 290 220 247 147   Basic Metabolic Panel: Recent Labs  Lab 01/07/18 0510 01/08/18 0500 01/09/18 0505 01/10/18 0500 01/10/18 1417 01/11/18 0413  NA 139 140 139 138  --  137  K 3.2* 3.3* 3.0* 3.0*  --  3.5  CL 109 114* 104 104  --  107  CO2 '24 22 27 27  '$ --  25  GLUCOSE 221* 123* 172* 208*  --  262*  BUN '13 8 7 7  '$ --  8  CREATININE 0.95 0.88 1.02* 1.05*  --  1.04*  CALCIUM 7.6* 7.3* 8.1* 8.0*  --  8.1*  MG  --   --   --  1.6* 1.7  --    GFR: Estimated Creatinine Clearance: 56.3 mL/min (A) (by C-G formula based on SCr of 1.04 mg/dL (H)). Liver Function Tests: Recent Labs  Lab 01/07/18 0510  AST 29  ALT 22  ALKPHOS 99  BILITOT 0.6    PROT 5.2*  ALBUMIN 2.4*   No results for input(s): LIPASE, AMYLASE in the last 168 hours. No results for input(s): AMMONIA in the last 168 hours. Coagulation Profile: Recent Labs  Lab 01/04/18 1825  INR 1.27   Cardiac Enzymes: Recent Labs  Lab 01/04/18 1544 01/04/18 1825 01/05/18 0203 01/05/18 1901  TROPONINI 2.37* 10.60* 40.47* 10.24*   BNP (last 3 results) No results for input(s): PROBNP in the last 8760 hours. HbA1C: No results for input(s): HGBA1C in the last 72 hours. CBG: Recent Labs  Lab 01/10/18 1257 01/10/18 1708 01/10/18 2128 01/11/18 0806 01/11/18 1229  GLUCAP 144* 229* 211* 246* 140*   Lipid Profile: No results for input(s): CHOL, HDL, LDLCALC, TRIG, CHOLHDL, LDLDIRECT in the last 72 hours. Thyroid Function Tests: No results for input(s): TSH, T4TOTAL, FREET4, T3FREE, THYROIDAB in the last 72 hours. Anemia Panel: No results for input(s): VITAMINB12, FOLATE, FERRITIN, TIBC, IRON, RETICCTPCT in the last 72 hours. Urine analysis:    Component Value Date/Time   COLORURINE YELLOW 01/06/2018 2008   APPEARANCEUR CLEAR 01/06/2018 2008   LABSPEC 1.015 01/06/2018 2008   PHURINE 6.0 01/06/2018 2008   GLUCOSEU >=500 (A) 01/06/2018 2008   HGBUR NEGATIVE 01/06/2018 2008   BILIRUBINUR NEGATIVE 01/06/2018 2008   BILIRUBINUR neg 01/17/2017 1533   KETONESUR 5 (A) 01/06/2018 2008   PROTEINUR 30 (A) 01/06/2018 2008   UROBILINOGEN 0.2 01/17/2017 1533   UROBILINOGEN 0.2 04/27/2015 1608   NITRITE NEGATIVE 01/06/2018 2008   LEUKOCYTESUR NEGATIVE 01/06/2018 2008     Bernerd Terhune M.D. [facility] 01/11/2018, 12:33 PM  Pager: [phone] Between 7am to 7pm - call Pager - [phone]  After 7pm go to [email or web address] - password [facility]  Call night coverage person covering after 7pm

## 2018-01-11 NOTE — Plan of Care (Signed)
Continue current care plan 

## 2018-01-11 NOTE — Progress Notes (Signed)
CARDIAC REHAB PHASE I   PRE:  Rate/Rhythm: 76 SR  BP:  Supine: 104/48  Sitting:   Standing:    SaO2: 98%RA  MODE:  Ambulation: 180 ft   POST:  Rate/Rhythm: 88 SR  BP:  Supine:   Sitting: 119/56  Standing:    SaO2: 96%RA 1030-1059 Pt walked 180 ft on RA holding to IV pole with slow steady gait. Tolerated well. Gave pt IS and she demonstrated 750 ml correctly. Encouraged her to practice. Reinforced sternal precautions and staying in the tube. To recliner after walk. Stressed importance of mobility and IS after surgery.   Graylon Good, RN BSN  01/11/2018 10:54 AM

## 2018-01-11 NOTE — Progress Notes (Signed)
Subjective:  No complaints of shortness of breath or chest pain.  A lot of questions concerning surgery  Objective:  Vital Signs in the last 24 hours: BP 128/65 (BP Location: Right Arm)   Pulse 79   Temp 98.7 F (37.1 C) (Oral)   Resp 19   Ht 5\' 1"  (1.549 m)   Wt 66.1 kg (145 lb 11.6 oz)   LMP  (LMP Unknown)   SpO2 93%   BMI 27.53 kg/m   Physical Exam: Soft-spoken black female currently in no acute distress Lungs:  Clear Cardiac:  Regular rhythm, normal S1 and S2, no S3 Abdomen:  Soft, nontender, no masses Extremities:  No edema present  Intake/Output from previous day: 07/19 0701 - 07/20 0700 In: 1406.5 [P.O.:1270; I.V.:136.5] Out: 400 [Urine:400]  Weight Filed Weights   01/05/18 2106  Weight: 66.1 kg (145 lb 11.6 oz)    Lab Results: Basic Metabolic Panel: Recent Labs    01/10/18 0500 01/11/18 0413  NA 138 137  K 3.0* 3.5  CL 104 107  CO2 27 25  GLUCOSE 208* 262*  BUN 7 8  CREATININE 1.05* 1.04*   CBC: Recent Labs    01/10/18 1417 01/11/18 0413  WBC 4.7 4.2  HGB 8.4* 7.6*  HCT 27.3* 25.0*  MCV 87.5 89.6  PLT 247 231    Telemetry: Sinus rhythm  Assessment/Plan:  1.  Significant three-vessel coronary artery disease awaiting surgery on Tuesday 2.  Recent non-STEMI 3.  Hypertension 4.  Diabetes mellitus with gastroparesis and previous DKA 5.  Significant anemia  Previous hemoglobin was 12 earlier this year.  Recommendations:  She had some burr cells noted on some of her blood counts and may need to have hematology consult.  Will need anemia workup if it has not already been done.  We'll need to watch anemia on heparin. Numerous questions answered regarding surgery.     Kerry Hough  MD [facility]  01/11/2018, 8:12 AM

## 2018-01-12 DIAGNOSIS — E1010 Type 1 diabetes mellitus with ketoacidosis without coma: Secondary | ICD-10-CM

## 2018-01-12 LAB — BASIC METABOLIC PANEL
Anion gap: 6 (ref 5–15)
CALCIUM: 8.2 mg/dL — AB (ref 8.9–10.3)
CO2: 24 mmol/L (ref 22–32)
CREATININE: 0.96 mg/dL (ref 0.44–1.00)
Chloride: 108 mmol/L (ref 98–111)
GFR calc non Af Amer: >60 mL/min (ref >60)
Glucose, Bld: 237 mg/dL — ABNORMAL HIGH (ref 70–99)
Potassium: 3.7 mmol/L (ref 3.5–5.1)
SODIUM: 138 mmol/L (ref 135–145)

## 2018-01-12 LAB — IRON AND TIBC
Iron: 14 ug/dL — ABNORMAL LOW (ref 28–170)
SATURATION RATIOS: 5 % — AB (ref 10.4–31.8)
TIBC: 283 ug/dL (ref 250–450)
UIBC: 269 ug/dL

## 2018-01-12 LAB — RETICULOCYTES
RBC.: 2.7 MIL/uL — AB (ref 3.87–5.11)
Retic Count, Absolute: 78.3 10*3/uL (ref 19.0–186.0)
Retic Ct Pct: 2.9 % (ref 0.4–3.1)

## 2018-01-12 LAB — GLUCOSE, CAPILLARY
Glucose-Capillary: 201 mg/dL — ABNORMAL HIGH (ref 70–99)
Glucose-Capillary: 106 mg/dL — ABNORMAL HIGH (ref 70–99)
Glucose-Capillary: 200 mg/dL — ABNORMAL HIGH (ref 70–99)
Glucose-Capillary: 176 mg/dL — ABNORMAL HIGH (ref 70–99)

## 2018-01-12 LAB — PREPARE RBC (CROSSMATCH)

## 2018-01-12 LAB — HEPARIN LEVEL (UNFRACTIONATED): HEPARIN UNFRACTIONATED: 0.4 [IU]/mL (ref 0.30–0.70)

## 2018-01-12 LAB — FERRITIN: FERRITIN: 20 ng/mL (ref 11–307)

## 2018-01-12 LAB — CBC
HCT: 23.9 % — ABNORMAL LOW (ref 36.0–46.0)
Hemoglobin: 7.4 g/dL — ABNORMAL LOW (ref 12.0–15.0)
MCH: 27.4 pg (ref 26.0–34.0)
MCHC: 31 g/dL (ref 30.0–36.0)
MCV: 88.5 fL (ref 78.0–100.0)
PLATELETS: 223 10*3/uL (ref 150–400)
RBC: 2.7 MIL/uL — ABNORMAL LOW (ref 3.87–5.11)
RDW: 16.6 % — ABNORMAL HIGH (ref 11.5–15.5)
WBC: 4.3 10*3/uL (ref 4.0–10.5)

## 2018-01-12 LAB — FOLATE: Folate: 11.2 ng/mL (ref >5.9)

## 2018-01-12 LAB — VITAMIN B12: VITAMIN B 12: 266 pg/mL (ref 180–914)

## 2018-01-12 MED ORDER — INSULIN ASPART 100 UNIT/ML ~~LOC~~ SOLN
5.0000 [IU] | Freq: Three times a day (TID) | SUBCUTANEOUS | Status: DC
  Administered 2018-01-12 – 2018-01-13 (×5): 5 [IU] via SUBCUTANEOUS

## 2018-01-12 MED ORDER — FERUMOXYTOL INJECTION 510 MG/17 ML
510.0000 mg | Freq: Once | INTRAVENOUS | Status: AC
  Administered 2018-01-12: 510 mg via INTRAVENOUS
  Filled 2018-01-12: qty 17

## 2018-01-12 MED ORDER — INSULIN DETEMIR 100 UNIT/ML ~~LOC~~ SOLN
20.0000 [IU] | Freq: Every day | SUBCUTANEOUS | Status: DC
  Administered 2018-01-12 – 2018-01-13 (×2): 20 [IU] via SUBCUTANEOUS
  Filled 2018-01-12 (×2): qty 0.2

## 2018-01-12 MED ORDER — SODIUM CHLORIDE 0.9% IV SOLUTION
Freq: Once | INTRAVENOUS | Status: AC
  Administered 2018-01-12: 16:00:00 via INTRAVENOUS

## 2018-01-12 NOTE — Progress Notes (Signed)
ANTICOAGULATION CONSULT NOTE -  Follow Up Consult  Pharmacy Consult for IV heparin Indication: chest pain/ACS  Allergies  Allergen Reactions  . Anesthetics, Amide Nausea And Vomiting  . Penicillins Diarrhea, Nausea And Vomiting and Other (See Comments)    Has patient had a PCN reaction causing immediate rash, facial/tongue/throat swelling, SOB or lightheadedness with hypotension: Yes Has patient had a PCN reaction causing severe rash involving mucus membranes or skin necrosis: No Has patient had a PCN reaction that required hospitalization No Has patient had a PCN reaction occurring within the last 10 years: Yes  If all of the above answers are "NO", then may proceed with Cephalosporin use.   . Buprenorphine Hcl Rash  . Encainide Nausea And Vomiting  . Metoclopramide Other (See Comments)    Dystonia, muscle rigidity.    Patient Measurements: Height: 5\' 1"  (154.9 cm) Weight: 145 lb 11.6 oz (66.1 kg) IBW/kg (Calculated) : 47.8 Heparin Dosing Weight: 68 kg   Vital Signs: Temp: 98.1 F (36.7 C) (07/21 0822) Temp Source: Oral (07/21 0822) BP: 135/67 (07/21 0822) Pulse Rate: 82 (07/21 0842)  Labs: Recent Labs    01/10/18 0454  01/10/18 0500 01/10/18 1417 01/11/18 0413 01/12/18 0400 01/12/18 0625  HGB  --    < > 8.1* 8.4* 7.6*  --  7.4*  HCT  --    < > 26.4* 27.3* 25.0*  --  23.9*  PLT  --    < > 220 247 231  --  223  HEPARINUNFRC 0.37  --   --   --  0.44 0.40  --   CREATININE  --   --  1.05*  --  1.04*  --  0.96   < > = values in this interval not displayed.   Estimated Creatinine Clearance: 61 mL/min (by C-G formula based on SCr of 0.96 mg/dL).  Medical History: Past Medical History:  Diagnosis Date  . Depression   . Diabetes mellitus without complication ([place])   . Essential hypertension   . Gastroparesis   . GERD (gastroesophageal reflux disease)   . HLD (hyperlipidemia)    Medications:  Scheduled:  . aspirin  81 mg Oral Daily  . atorvastatin  80 mg Oral  q1800  . busPIRone  10 mg Oral TID  . Chlorhexidine Gluconate Cloth  6 each Topical Daily  . fluticasone  2 spray Each Nare Daily  . insulin aspart  0-5 Units Subcutaneous QHS  . insulin aspart  0-9 Units Subcutaneous TID WC  . insulin aspart  4 Units Subcutaneous TID WC  . insulin detemir  15 Units Subcutaneous QHS  . metoprolol tartrate  25 mg Oral BID  . pantoprazole  40 mg Oral Q1200  . potassium chloride  40 mEq Oral Daily  . pregabalin  150 mg Oral Daily  . pyridostigmine  30 mg Oral TID WC  . sodium chloride flush  10-40 mL Intracatheter Q12H  . sodium chloride flush  3 mL Intravenous Q12H   Assessment: 69 yoF admitted with ACS found to have severe 3vCAD. Pt to resume IV heparin while awaiting TCTS surgery. Heparin level is therapeutic today at 0.40 and no noted acute bleeding.  She does have some anemia though (7.4/23.9).  Iron panel completed and reveals low stores as well.    Goal of Therapy:  Heparin level 0.3-0.7 units/ml Monitor platelets by anticoagulation protocol: Yes   Plan:  -Continue heparin 1050 units/hr - will strive for lower end given her anemia. -Monitor daily heparin levels,  CBC, and for s/sx of bleeding -F/u plans for CABG - Consider iron supplementation before/after surgery if you feel appropriate.  Rober Minion, PharmD., MS Clinical Pharmacist Pager:  [phone] Thank you for allowing pharmacy to be part of this patients care team. 01/12/2018

## 2018-01-12 NOTE — Progress Notes (Signed)
Subjective:  No complaints of shortness of breath or chest pain.  Anemia still appreciated and has a previous history of iron deficiency anemia.  Iron levels and ferritin were drawn.  It appears from reviewing the chart that she has had intravenous iron in the past.  Objective:  Vital Signs in the last 24 hours: BP 124/60 (BP Location: Right Arm)   Pulse 78   Temp 98.4 F (36.9 C) (Oral)   Resp 19   Ht 5\' 1"  (1.549 m)   Wt 66.1 kg (145 lb 11.6 oz)   LMP  (LMP Unknown)   SpO2 97%   BMI 27.53 kg/m   Physical Exam: Soft-spoken black female currently in no acute distress Lungs:  Clear Cardiac:  Regular rhythm, normal S1 and S2, no S3 Abdomen:  Soft, nontender, no masses Extremities:  No edema present  Intake/Output from previous day: 07/20 0701 - 07/21 0700 In: 418.5 [P.O.:240; I.V.:178.5] Out: 850 [Urine:850]  Weight Filed Weights   01/05/18 2106  Weight: 66.1 kg (145 lb 11.6 oz)    Lab Results: Basic Metabolic Panel: Recent Labs    01/11/18 0413 01/12/18 0625  NA 137 138  K 3.5 3.7  CL 107 108  CO2 25 24  GLUCOSE 262* 237*  BUN 8 <5*  CREATININE 1.04* 0.96   CBC: Recent Labs    01/11/18 0413 01/12/18 0625  WBC 4.2 4.3  HGB 7.6* 7.4*  HCT 25.0* 23.9*  MCV 89.6 88.5  PLT 231 223    Telemetry: Sinus rhythm  Assessment/Plan:  1.  Significant three-vessel coronary artery disease awaiting surgery on Tuesday 2.  Recent non-STEMI 3.  Hypertension 4.  Diabetes mellitus with gastroparesis and previous DKA 5.  Significant anemia   in light of anticipated blood loss from surgery may be good to go ahead and start replacing iron ahead of time.  Consider some iron infusions prior to surgery and also may or may not need transfusion depending on desire of surgeon or whether to give that at the time of surgery.  Recommendations:  Consider IV iron to help replace iron stores prior to surgery although likely will anticipate significant additional blood loss with  surgery and a pump run.   Kerry Hough  MD [facility]  01/12/2018, 8:22 AM

## 2018-01-12 NOTE — Progress Notes (Signed)
[facility]                                                                              Patient Demographics  [name], is a 52 y.o. female, DOB - 1965-11-02, UVJ:[id number]  Admit date - 01/04/2018   Admitting Physician Cristal Ford, DO  Outpatient Primary MD for the patient is [name], MD  Outpatient specialists:   LOS - 8  days   Medical records reviewed and are as summarized below:    Chief Complaint  Patient presents with  . Hyperglycemia       Brief summary  [name] is a 52 y.o. femalewith a medical history ofdiabetes mellitus, medical noncompliance, depression. She presented with DKA, started on IV insulin and now found to have an NSTEMI. Started on heparin drip. DKA resolved.  Assessment & Plan    Principal Problem:   DKA (diabetic ketoacidoses) ([place]) with underlying history of uncontrolled type 1 diabetes mellitus -Patient has recurrent admissions for DKA, currently resolved, patient has been transitioned to subcutaneous insulin -CBG still uncontrolled, fasting 237 this morning, increased Levemir to 20 units qhs, increase NovoLog meal coverage to 5units TID AC -Continue sliding scale insulin -Hemoglobin A1c 8.6  Active Problems:   Non-ST elevation (NSTEMI) myocardial infarction ([place]), troponin peaked at 40.47 -Currently no chest pain.  Underwent cardiac cath on 7/16 which showed multivessel severe coronary disease, recommended surgical consultation for CABG -2D echo showed EF of 55% with hypokinesis of inferior and apical walls -Continue IV heparin  -Appreciate CT VS consultation, plan for CABG early next week  Acute on chronic iron deficiency anemia -Anemia panel obtained, iron 14, ferritin 20, saturation ratios 5, previous history of iron deficiency anemia and has received IV iron in the past -Will transfuse 2 units packed RBCs with Feraheme x1 today  -Follow FOBT     Anxiety and depression Continue BuSpar   AKI (acute kidney injury) ([place]) - admitted with creatinine of 2.1, -Creatinine now normalized  Diarrhea -Resolved per patient, C. difficile negative  -Continue Lomotil as needed     essential hypertension -BP stable, continue metoprolol  Hypokalemia with hypomagnesemia -Potassium improved with supplementation  Lactic acidosis with sepsis ruled out, -No source of infection, likely secondary to DKA and dehydration   Code Status full code DVT Prophylaxis: Heparin drip Family Communication: Discussed in detail with the patient, all imaging results, lab results explained to the patient     Disposition Plan: CABG next week  Time Spent in minutes   25 minutes   Procedures:  Cardiac cath on 7/16  Consultants:   Cardiology TCTS  Antimicrobials:      Medications  Scheduled Meds: . sodium chloride   Intravenous Once  . aspirin  81 mg Oral Daily  . atorvastatin  80 mg Oral q1800  . busPIRone  10 mg Oral TID  . Chlorhexidine Gluconate Cloth  6 each Topical Daily  . fluticasone  2 spray Each Nare Daily  . insulin aspart  0-5 Units Subcutaneous QHS  . insulin aspart  0-9 Units Subcutaneous TID WC  . insulin aspart  4 Units Subcutaneous TID WC  .  insulin detemir  15 Units Subcutaneous QHS  . metoprolol tartrate  25 mg Oral BID  . pantoprazole  40 mg Oral Q1200  . potassium chloride  40 mEq Oral Daily  . pregabalin  150 mg Oral Daily  . pyridostigmine  30 mg Oral TID WC  . sodium chloride flush  10-40 mL Intracatheter Q12H  . sodium chloride flush  3 mL Intravenous Q12H   Continuous Infusions: . sodium chloride    . ferumoxytol    . heparin 1,050 Units/hr (01/11/18 1742)   PRN Meds:.sodium chloride, acetaminophen **OR** acetaminophen, diazepam, loperamide, ondansetron (ZOFRAN) IV, ondansetron **OR** [DISCONTINUED] ondansetron (ZOFRAN) IV, promethazine, sodium chloride flush, sodium chloride flush   Antibiotics   Anti-infectives (From admission, onward)   Start      Dose/Rate Route Frequency Ordered Stop   01/10/18 0400  vancomycin (VANCOCIN) 1,250 mg in sodium chloride 0.9 % 250 mL IVPB  Status:  Discontinued     1,250 mg 166.7 mL/hr over 90 Minutes Intravenous To Surgery 01/09/18 1311 01/09/18 1929   01/10/18 0400  levofloxacin (LEVAQUIN) IVPB 500 mg  Status:  Discontinued     500 mg 100 mL/hr over 60 Minutes Intravenous To Surgery 01/09/18 1311 01/09/18 1929        Subjective:   [name] was seen and examined today.  No complaints, eating breakfast, feels a lot better, diarrhea has completely resolved.  No chest pain, shortness of breath, abdominal pain, nausea vomiting.    Objective:   Vitals:   01/12/18 0012 01/12/18 0356 01/12/18 0822 01/12/18 0842  BP: (!) 106/51 124/60 135/67   Pulse: 79 78 86 82  Resp: 16 19 (!) 28   Temp: 97.7 F (36.5 C) 98.4 F (36.9 C) 98.1 F (36.7 C)   TempSrc: Oral Oral Oral   SpO2: 96% 97% 95%   Weight:      Height:        Intake/Output Summary (Last 24 hours) at 01/12/2018 1136 Last data filed at 01/12/2018 0532 Gross per 24 hour  Intake 147 ml  Output 400 ml  Net -253 ml     Wt Readings from Last 3 Encounters:  01/05/18 66.1 kg (145 lb 11.6 oz)  12/23/17 68 kg (150 lb)  12/10/17 65 kg (143 lb 4.8 oz)     Exam   General: Alert and oriented x 3, NAD  Eyes:   HEENT:  Atraumatic, normocephalic  Cardiovascular: S1 S2 auscultated, RRR no pedal edema b/l  Respiratory: Clear to auscultation bilaterally, no wheezing, rales or rhonchi  Gastrointestinal: Soft, nontender, nondistended, + bowel sounds  Ext: no pedal edema bilaterally  Neuro: no new deficits  Musculoskeletal: No digital cyanosis, clubbing  Skin: No rashes  Psych: Normal affect and demeanor, alert and oriented x3    Data Reviewed:  I have personally reviewed following labs and imaging studies  Micro Results Recent Results (from the past 240 hour(s))  MRSA PCR Screening     Status: None   Collection Time:  01/04/18  3:41 PM  Result Value Ref Range Status   MRSA by PCR NEGATIVE NEGATIVE Final    Comment:        The GeneXpert MRSA Assay (FDA approved for NASAL specimens only), is one component of a comprehensive MRSA colonization surveillance program. It is not intended to diagnose MRSA infection nor to guide or monitor treatment for MRSA infections. Performed at [facility], [place] [address], [place], [place] [address]   C difficile quick scan w  PCR reflex     Status: None   Collection Time: 01/06/18  6:37 AM  Result Value Ref Range Status   C Diff antigen NEGATIVE NEGATIVE Final   C Diff toxin NEGATIVE NEGATIVE Final   C Diff interpretation No C. difficile detected.  Final    Comment: Performed at [facility], [place] [address], [place], [place] [address]    Radiology Reports Dg Abd 1 View  Result Date: 01/04/2018 CLINICAL DATA:  Onset of abdominal pain. EXAM: ABDOMEN - 1 VIEW COMPARISON:  12/23/2017 FINDINGS: Bowel gas pattern is nonobstructive. Again noted is gaseous distension of the stomach, raising the question of aerophagia. Surgical clips are noted in the RIGHT UPPER QUADRANT the abdomen. There is no evidence for free intraperitoneal air. IMPRESSION: 1. No evidence for acute abnormality. 2. Persistent gaseous distension of the stomach. Question of aerophagia. Electronically Signed   By: Nolon Nations M.D.   On: 01/04/2018 16:50   Dg Chest Port 1 View  Result Date: 01/07/2018 CLINICAL DATA:  PICC placement EXAM: PORTABLE CHEST 1 VIEW COMPARISON:  01/04/2018 FINDINGS: Left PICC line in place with the tip in the SVC. Patchy airspace disease bilaterally, right greater than left. Small right effusion. Heart is normal size. IMPRESSION: Worsening bilateral airspace disease, right greater than left concerning for multifocal pneumonia. Small right effusion. Left PICC line tip in the lower SVC. Electronically Signed   By: Rolm Baptise M.D.   On:  01/07/2018 10:11   Dg Chest Portable 1 View  Result Date: 01/04/2018 CLINICAL DATA:  History of diabetes.  Tachypnea. EXAM: PORTABLE CHEST 1 VIEW COMPARISON:  December 23, 2017 FINDINGS: The heart size and mediastinal contours are within normal limits. Both lungs are clear. The visualized skeletal structures are unremarkable. IMPRESSION: No active disease. Electronically Signed   By: Dorise Bullion III M.D   On: 01/04/2018 11:39   Dg Abd Acute W/chest  Result Date: 12/23/2017 CLINICAL DATA:  Abdominal and back pain. Shortness of breath. History of gastroparesis. EXAM: DG ABDOMEN ACUTE W/ 1V CHEST COMPARISON:  Single-view of the chest 12/10/2017. Plain films chest and abdomen 11/22/2017. FINDINGS: Single-view of the chest demonstrates clear lungs and normal heart size. No pneumothorax or pleural effusion. Two views of the abdomen show no free intraperitoneal air. There is gaseous distention of the stomach. Small and large bowel are unremarkable. No unexpected abdominal calcification or focal bony abnormality. IMPRESSION: Gaseous distention of the stomach may be related to the patient's history of diabetic gastroparesis. The examination is otherwise negative. Electronically Signed   By: [name] Rise M.D.   On: 12/23/2017 19:01   Irpicc Placement Left >5 Yrs Inc Img Guide  Result Date: 01/06/2018 CLINICAL DATA:  STEMI, poor venous access EXAM: PICC PLACEMENT WITH ULTRASOUND AND FLUOROSCOPY FLUOROSCOPY TIME:  0.1 minute; 34 uGym2 DAP TECHNIQUE: After written informed consent was obtained, patient was placed in the supine position on angiographic table. Patency of the left basilic vein was confirmed with ultrasound with image documentation. An appropriate skin site was determined. Skin site was marked. Region was prepped using maximum barrier technique including cap and mask, sterile gown, sterile gloves, large sterile sheet, and Chlorhexidine as cutaneous antisepsis. The region was infiltrated locally with  1% lidocaine. Under real-time ultrasound guidance, the left basilic vein was accessed with a 21 gauge micropuncture needle; the needle tip within the vein was confirmed with ultrasound image documentation. Needle exchanged over a 018 guidewire for a peel-away sheath, through which a 5-French double-lumen power  injectable PICC trimmed to 35cm was advanced, positioned with its tip near the cavoatrial junction. Spot chest radiograph confirms appropriate catheter position. Catheter was flushed per protocol and secured externally. The patient tolerated procedure well. COMPLICATIONS: COMPLICATIONS none IMPRESSION: 1. Technically successful five [place] double lumen power injectable PICC placement Electronically Signed   By: Lucrezia Europe M.D.   On: 01/06/2018 16:55   Irpicc Placement Left >5 Yrs Inc Img Guide  Result Date: 12/23/2017 INDICATION: 52 year old female with a history of DKA EXAM: PICC LINE PLACEMENT WITH ULTRASOUND AND FLUOROSCOPIC GUIDANCE MEDICATIONS: None ANESTHESIA/SEDATION: None FLUOROSCOPY TIME:  Fluoroscopy Time: 0 minutes 42 seconds (1.9 mGy). COMPLICATIONS: None PROCEDURE: Informed written consent was obtained from the patient after a thorough discussion of the procedural risks, benefits and alternatives. All questions were addressed. Maximal Sterile Barrier Technique was utilized including caps, mask, sterile gowns, sterile gloves, sterile drape, hand hygiene and skin antiseptic. A timeout was performed prior to the initiation of the procedure. Patient was position in the supine position on the fluoroscopy table with the right arm abducted 90 degrees. Ultrasound survey of the upper extremity was performed with images stored and sent to PACs. The left brachial vein was selected for access. Once the patient was prepped and draped in the usual sterile fashion, the skin and subcutaneous tissues were generously infiltrated with 1% lidocaine for local anesthesia. A micropuncture access kit was then used to  access the targeted vein. Wire was passed centrally, confirmed to be within the venous system under fluoroscopy. A small stab incision was made with an 11 blade scalpel and the sheath was then placed over the wire. Estimated length of the catheter was then performed with the indwelling wire. Catheter was amputated at 41 cm length and placed with coaxial wire through the peel-away. Double-lumen, power injectable PICC in the left brachial vein. Tip confirmed at the cavoatrial junction, and the catheter is ready for use. Stat lock was placed. Patient tolerated the procedure well and remained hemodynamically stable throughout. No complications were encountered and no significant blood loss was encountered. IMPRESSION: Status post left upper extremity PICC. Signed, Dulcy Fanny. Dellia Nims, RPVI [facility] Radiology PLAN: Anticipate future PICC placement to be difficult, with limited venous access and small caliber veins. Electronically Signed   By: Corrie Mckusick D.O.   On: 12/23/2017 17:33   Ir Picc Placement Right >5 Yrs Inc Img Guide  Result Date: 12/13/2017 CLINICAL DATA:  Diabetic ketoacidosis, poor peripheral IV access EXAM: PICC PLACEMENT WITH ULTRASOUND AND FLUOROSCOPY FLUOROSCOPY TIME:  1.3 minutes; 196 uGym2 DAP TECHNIQUE: After written informed consent was obtained, patient was placed in the supine position on angiographic table. Patency of the right brachial vein was confirmed with ultrasound with image documentation. An appropriate skin site was determined. Skin site was marked. Region was prepped using maximum barrier technique including cap and mask, sterile gown, sterile gloves, large sterile sheet, and Chlorhexidine as cutaneous antisepsis. The region was infiltrated locally with 1% lidocaine. Under real-time ultrasound guidance, the right brachial vein was accessed with a 21 gauge micropuncture needle; the needle tip within the vein was confirmed with  ultrasound image documentation. Needle exchanged over a 018 guidewire for a peel-away sheath, through which a 5-French double-lumen power injectable PICC trimmed to 30cm was advanced, positioned with its tip near the cavoatrial junction. There was focal resistance in the central subclavian vein, overcome by using 2 parallel 018 guidewires. Spot chest radiograph confirms appropriate catheter position. Catheter was flushed  per protocol and secured externally. The patient tolerated procedure well. COMPLICATIONS: COMPLICATIONS none IMPRESSION: 1. Technically successful five [place] double lumen power injectable PICC placement Electronically Signed   By: Lucrezia Europe M.D.   On: 12/13/2017 16:49   [place] Ekg Site Rite  Result Date: 01/05/2018 If Site Rite image not attached, placement could not be confirmed due to current cardiac rhythm.   Lab Data:  CBC: Recent Labs  Lab 01/09/18 0505 01/10/18 0500 01/10/18 1417 01/11/18 0413 01/12/18 0625  WBC 7.2 4.3 4.7 4.2 4.3  HGB 9.5* 8.1* 8.4* 7.6* 7.4*  HCT 30.5* 26.4* 27.3* 25.0* 23.9*  MCV 87.1 88.3 87.5 89.6 88.5  PLT 290 220 247 231 735   Basic Metabolic Panel: Recent Labs  Lab 01/08/18 0500 01/09/18 0505 01/10/18 0500 01/10/18 1417 01/11/18 0413 01/12/18 0625  NA 140 139 138  --  137 138  K 3.3* 3.0* 3.0*  --  3.5 3.7  CL 114* 104 104  --  107 108  CO2 _0 --  25 24  GLUCOSE 123* 172* 208*  --  262* 237*  BUN _1 --  8 <5*  CREATININE 0.88 1.02* 1.05*  --  1.04* 0.96  CALCIUM 7.3* 8.1* 8.0*  --  8.1* 8.2*  MG  --   --  1.6* 1.7  --   --    GFR: Estimated Creatinine Clearance: 61 mL/min (by C-G formula based on SCr of 0.96 mg/dL). Liver Function Tests: Recent Labs  Lab 01/07/18 0510  AST 29  ALT 22  ALKPHOS 99  BILITOT 0.6  PROT 5.2*  ALBUMIN 2.4*   No results for input(s): LIPASE, AMYLASE in the last 168 hours. No results for input(s): AMMONIA in the last 168 hours. Coagulation Profile: No results for input(s): INR,  PROTIME in the last 168 hours. Cardiac Enzymes: Recent Labs  Lab 01/05/18 1901  TROPONINI 10.24*   BNP (last 3 results) No results for input(s): PROBNP in the last 8760 hours. HbA1C: No results for input(s): HGBA1C in the last 72 hours. CBG: Recent Labs  Lab 01/11/18 0806 01/11/18 1229 01/11/18 1726 01/11/18 2143 01/12/18 0819  GLUCAP 246* 140* 211* 263* 201*   Lipid Profile: No results for input(s): CHOL, HDL, LDLCALC, TRIG, CHOLHDL, LDLDIRECT in the last 72 hours. Thyroid Function Tests: No results for input(s): TSH, T4TOTAL, FREET4, T3FREE, THYROIDAB in the last 72 hours. Anemia Panel: Recent Labs    01/12/18 0625 01/12/18 0640  VITAMINB12 266  --   FOLATE  --  11.2  FERRITIN 20  --   TIBC 283  --   IRON 14*  --   RETICCTPCT 2.9  --    Urine analysis:    Component Value Date/Time   COLORURINE YELLOW 01/06/2018 2008   APPEARANCEUR CLEAR 01/06/2018 2008   LABSPEC 1.015 01/06/2018 2008   PHURINE 6.0 01/06/2018 2008   GLUCOSEU >=500 (A) 01/06/2018 2008   HGBUR NEGATIVE 01/06/2018 2008   BILIRUBINUR NEGATIVE 01/06/2018 2008   BILIRUBINUR neg 01/17/2017 1533   KETONESUR 5 (A) 01/06/2018 2008   PROTEINUR 30 (A) 01/06/2018 2008   UROBILINOGEN 0.2 01/17/2017 1533   UROBILINOGEN 0.2 04/27/2015 1608   NITRITE NEGATIVE 01/06/2018 2008   LEUKOCYTESUR NEGATIVE 01/06/2018 2008     Ripudeep Rai M.D. [facility] 01/12/2018, 11:36 AM  Pager: [phone] Between 7am to 7pm - call Pager - [phone]  After 7pm go to [email or web address] - password [email or web address]  Call night coverage person covering after 7pm

## 2018-01-13 ENCOUNTER — Inpatient Hospital Stay (HOSPITAL_COMMUNITY): Payer: Medicare Other

## 2018-01-13 DIAGNOSIS — R030 Elevated blood-pressure reading, without diagnosis of hypertension: Secondary | ICD-10-CM

## 2018-01-13 DIAGNOSIS — I25119 Atherosclerotic heart disease of native coronary artery with unspecified angina pectoris: Secondary | ICD-10-CM

## 2018-01-13 LAB — BASIC METABOLIC PANEL
Anion gap: 7 (ref 5–15)
BUN: 7 mg/dL (ref 6–20)
CHLORIDE: 107 mmol/L (ref 98–111)
CO2: 25 mmol/L (ref 22–32)
Calcium: 8.6 mg/dL — ABNORMAL LOW (ref 8.9–10.3)
Creatinine, Ser: 0.94 mg/dL (ref 0.44–1.00)
Glucose, Bld: 126 mg/dL — ABNORMAL HIGH (ref 70–99)
POTASSIUM: 3.8 mmol/L (ref 3.5–5.1)
SODIUM: 139 mmol/L (ref 135–145)

## 2018-01-13 LAB — GLUCOSE, CAPILLARY
GLUCOSE-CAPILLARY: 131 mg/dL — AB (ref 70–99)
GLUCOSE-CAPILLARY: 144 mg/dL — AB (ref 70–99)
Glucose-Capillary: 231 mg/dL — ABNORMAL HIGH (ref 70–99)
Glucose-Capillary: 224 mg/dL — ABNORMAL HIGH (ref 70–99)

## 2018-01-13 LAB — URINALYSIS, ROUTINE W REFLEX MICROSCOPIC
Bacteria, UA: NONE SEEN
Bilirubin Urine: NEGATIVE
Glucose, UA: 50 mg/dL — AB
Ketones, ur: NEGATIVE mg/dL
Leukocytes, UA: NEGATIVE
Nitrite: NEGATIVE
Protein, ur: NEGATIVE mg/dL
Specific Gravity, Urine: 1.005 (ref 1.005–1.030)
pH: 6 (ref 5.0–8.0)

## 2018-01-13 LAB — PREPARE RBC (CROSSMATCH)

## 2018-01-13 LAB — BLOOD GAS, ARTERIAL
Acid-Base Excess: 1.7 mmol/L (ref 0.0–2.0)
Bicarbonate: 25.5 mmol/L (ref 20.0–28.0)
Drawn by: 347621
FIO2: 21
O2 Saturation: 91.8 %
Patient temperature: 98
pCO2 arterial: 37 mmHg (ref 32.0–48.0)
pH, Arterial: 7.45 (ref 7.350–7.450)
pO2, Arterial: 61.5 mmHg — ABNORMAL LOW (ref 83.0–108.0)

## 2018-01-13 LAB — CBC
HEMATOCRIT: 31.6 % — AB (ref 36.0–46.0)
HEMOGLOBIN: 10.2 g/dL — AB (ref 12.0–15.0)
MCH: 27.9 pg (ref 26.0–34.0)
MCHC: 32.3 g/dL (ref 30.0–36.0)
MCV: 86.6 fL (ref 78.0–100.0)
Platelets: 251 10*3/uL (ref 150–400)
RBC: 3.65 MIL/uL — ABNORMAL LOW (ref 3.87–5.11)
RDW: 15.9 % — ABNORMAL HIGH (ref 11.5–15.5)
WBC: 4.4 10*3/uL (ref 4.0–10.5)

## 2018-01-13 LAB — PROTIME-INR
INR: 0.98
Prothrombin Time: 12.9 seconds (ref 11.4–15.2)

## 2018-01-13 LAB — HEPARIN LEVEL (UNFRACTIONATED): Heparin Unfractionated: 0.38 IU/mL (ref 0.30–0.70)

## 2018-01-13 LAB — HEMOGLOBIN AND HEMATOCRIT, BLOOD
HEMATOCRIT: 31.1 % — AB (ref 36.0–46.0)
Hemoglobin: 10.1 g/dL — ABNORMAL LOW (ref 12.0–15.0)

## 2018-01-13 LAB — APTT: aPTT: 122 seconds — ABNORMAL HIGH (ref 24–36)

## 2018-01-13 MED ORDER — CHLORHEXIDINE GLUCONATE CLOTH 2 % EX PADS
6.0000 | MEDICATED_PAD | Freq: Once | CUTANEOUS | Status: AC
  Administered 2018-01-14: 6 via TOPICAL

## 2018-01-13 MED ORDER — TRANEXAMIC ACID (OHS) PUMP PRIME SOLUTION
2.0000 mg/kg | INTRAVENOUS | Status: DC
  Filled 2018-01-13: qty 1.32

## 2018-01-13 MED ORDER — POTASSIUM CHLORIDE 2 MEQ/ML IV SOLN
80.0000 meq | INTRAVENOUS | Status: DC
  Filled 2018-01-13: qty 40

## 2018-01-13 MED ORDER — CHLORHEXIDINE GLUCONATE 0.12 % MT SOLN
15.0000 mL | Freq: Once | OROMUCOSAL | Status: AC
  Administered 2018-01-14: 15 mL via OROMUCOSAL
  Filled 2018-01-13: qty 15

## 2018-01-13 MED ORDER — DEXMEDETOMIDINE HCL IN NACL 400 MCG/100ML IV SOLN
0.1000 ug/kg/h | INTRAVENOUS | Status: AC
  Administered 2018-01-14: .2 ug/kg/h via INTRAVENOUS
  Filled 2018-01-13: qty 100

## 2018-01-13 MED ORDER — METOPROLOL TARTRATE 50 MG PO TABS
50.0000 mg | ORAL_TABLET | Freq: Two times a day (BID) | ORAL | Status: DC
  Administered 2018-01-13: 50 mg via ORAL
  Filled 2018-01-13: qty 1

## 2018-01-13 MED ORDER — BISACODYL 5 MG PO TBEC: 5.0000 mg | DELAYED_RELEASE_TABLET | Freq: Once | ORAL | Status: DC

## 2018-01-13 MED ORDER — TRANEXAMIC ACID 1000 MG/10ML IV SOLN
1.5000 mg/kg/h | INTRAVENOUS | Status: AC
  Administered 2018-01-14: 1.5 mg/kg/h via INTRAVENOUS
  Filled 2018-01-13: qty 25

## 2018-01-13 MED ORDER — TRANEXAMIC ACID (OHS) BOLUS VIA INFUSION
15.0000 mg/kg | INTRAVENOUS | Status: AC
  Administered 2018-01-14: 991.5 mg via INTRAVENOUS
  Filled 2018-01-13: qty 992

## 2018-01-13 MED ORDER — LEVOFLOXACIN IN D5W 500 MG/100ML IV SOLN
500.0000 mg | INTRAVENOUS | Status: AC
  Administered 2018-01-14: 500 mg via INTRAVENOUS
  Filled 2018-01-13: qty 100

## 2018-01-13 MED ORDER — VANCOMYCIN HCL 10 G IV SOLR
1250.0000 mg | INTRAVENOUS | Status: AC
  Administered 2018-01-14: 1250 mg via INTRAVENOUS
  Filled 2018-01-13: qty 1250

## 2018-01-13 MED ORDER — DOPAMINE-DEXTROSE 3.2-5 MG/ML-% IV SOLN
0.0000 ug/kg/min | INTRAVENOUS | Status: DC
  Filled 2018-01-13: qty 250

## 2018-01-13 MED ORDER — EPINEPHRINE PF 1 MG/ML IJ SOLN
0.0000 ug/min | INTRAVENOUS | Status: DC
  Filled 2018-01-13: qty 4

## 2018-01-13 MED ORDER — NITROGLYCERIN IN D5W 200-5 MCG/ML-% IV SOLN
2.0000 ug/min | INTRAVENOUS | Status: AC
  Administered 2018-01-14: 100 ug/min via INTRAVENOUS
  Administered 2018-01-14: 16.6 ug/min via INTRAVENOUS
  Filled 2018-01-13: qty 250

## 2018-01-13 MED ORDER — TEMAZEPAM 15 MG PO CAPS: 15.0000 mg | ORAL_CAPSULE | Freq: Once | ORAL | Status: DC | PRN

## 2018-01-13 MED ORDER — CHLORHEXIDINE GLUCONATE CLOTH 2 % EX PADS
6.0000 | MEDICATED_PAD | Freq: Once | CUTANEOUS | Status: AC
  Administered 2018-01-13: 6 via TOPICAL

## 2018-01-13 MED ORDER — SODIUM CHLORIDE 0.9 % IV SOLN
INTRAVENOUS | Status: AC
  Administered 2018-01-14: 1 [IU]/h via INTRAVENOUS
  Administered 2018-01-14: 2.2 [IU]/h via INTRAVENOUS
  Filled 2018-01-13: qty 1

## 2018-01-13 MED ORDER — PLASMA-LYTE 148 IV SOLN
INTRAVENOUS | Status: DC
  Filled 2018-01-13: qty 2.5

## 2018-01-13 MED ORDER — SODIUM CHLORIDE 0.9 % IV SOLN
30.0000 ug/min | INTRAVENOUS | Status: AC
  Administered 2018-01-14: 5 ug/min via INTRAVENOUS
  Filled 2018-01-13: qty 2

## 2018-01-13 MED ORDER — MILRINONE LACTATE IN DEXTROSE 20-5 MG/100ML-% IV SOLN
0.1250 ug/kg/min | INTRAVENOUS | Status: AC
  Administered 2018-01-14: .3 ug/kg/min via INTRAVENOUS
  Filled 2018-01-13: qty 100

## 2018-01-13 MED ORDER — METOPROLOL TARTRATE 12.5 MG HALF TABLET
12.5000 mg | ORAL_TABLET | Freq: Once | ORAL | Status: AC
  Administered 2018-01-14: 12.5 mg via ORAL
  Filled 2018-01-13: qty 1

## 2018-01-13 MED ORDER — METOPROLOL TARTRATE 25 MG PO TABS
25.0000 mg | ORAL_TABLET | Freq: Once | ORAL | Status: AC
  Administered 2018-01-13: 25 mg via ORAL
  Filled 2018-01-13: qty 1

## 2018-01-13 MED ORDER — MAGNESIUM SULFATE 50 % IJ SOLN
40.0000 meq | INTRAMUSCULAR | Status: DC
  Filled 2018-01-13 (×2): qty 9.85

## 2018-01-13 MED ORDER — SODIUM CHLORIDE 0.9 % IV SOLN
INTRAVENOUS | Status: DC
  Filled 2018-01-13: qty 30

## 2018-01-13 NOTE — Progress Notes (Addendum)
Progress Note  Patient Name: [name] Date of Encounter: 01/13/2018  Primary Cardiologist: Mertie Moores, MD   Subjective   Denies any CP.  Has chronic DOE.  Up working with rehab currently  Inpatient Medications    Scheduled Meds: . aspirin  81 mg Oral Daily  . atorvastatin  80 mg Oral q1800  . busPIRone  10 mg Oral TID  . Chlorhexidine Gluconate Cloth  6 each Topical Daily  . fluticasone  2 spray Each Nare Daily  . insulin aspart  0-5 Units Subcutaneous QHS  . insulin aspart  0-9 Units Subcutaneous TID WC  . insulin aspart  5 Units Subcutaneous TID WC  . insulin detemir  20 Units Subcutaneous QHS  . metoprolol tartrate  25 mg Oral BID  . pantoprazole  40 mg Oral Q1200  . potassium chloride  40 mEq Oral Daily  . pregabalin  150 mg Oral Daily  . pyridostigmine  30 mg Oral TID WC  . sodium chloride flush  10-40 mL Intracatheter Q12H  . sodium chloride flush  3 mL Intravenous Q12H   Continuous Infusions: . sodium chloride    . heparin 1,050 Units/hr (01/12/18 1900)   PRN Meds: sodium chloride, acetaminophen **OR** acetaminophen, diazepam, loperamide, ondansetron (ZOFRAN) IV, ondansetron **OR** [DISCONTINUED] ondansetron (ZOFRAN) IV, promethazine, sodium chloride flush, sodium chloride flush   Vital Signs    Vitals:   01/12/18 2301 01/13/18 0514 01/13/18 0728 01/13/18 0801  BP: (!) 147/69  (!) 161/85 (!) 158/78  Pulse: 82  88 81  Resp: (!) 24  (!) 28 (!) 26  Temp: 98.2 F (36.8 C) 98 F (36.7 C) 98.4 F (36.9 C) 98.5 F (36.9 C)  TempSrc: Oral Oral Oral Oral  SpO2: 100%  98% 94%  Weight:      Height:        Intake/Output Summary (Last 24 hours) at 01/13/2018 0906 Last data filed at 01/13/2018 0805 Gross per 24 hour  Intake 1225 ml  Output 725 ml  Net 500 ml   Filed Weights   01/05/18 2106  Weight: 145 lb 11.6 oz (66.1 kg)    Telemetry    NSR - Personally Reviewed  ECG    No new EKG to review - Personally Reviewed  Physical Exam   GEN:  No acute distress.   Neck: No JVD Cardiac: RRR, no murmurs, rubs, or gallops.  Respiratory: Clear to auscultation bilaterally. GI: Soft, nontender, non-distended  MS: No edema; No deformity. Neuro:  Nonfocal  Psych: Normal affect   Labs    Chemistry Recent Labs  Lab 01/07/18 0510  01/11/18 0413 01/12/18 0625 01/13/18 0500  NA 139   < > 137 138 139  K 3.2*   < > 3.5 3.7 3.8  CL 109   < > 107 108 107  CO2 24   < > 25 24 25   GLUCOSE 221*   < > 262* 237* 126*  BUN 13   < > 8 <5* 7  CREATININE 0.95   < > 1.04* 0.96 0.94  CALCIUM 7.6*   < > 8.1* 8.2* 8.6*  PROT 5.2*  --   --   --   --   ALBUMIN 2.4*  --   --   --   --   AST 29  --   --   --   --   ALT 22  --   --   --   --   ALKPHOS 99  --   --   --   --  BILITOT 0.6  --   --   --   --   GFRNONAA >60   < > >60 >60 >60  GFRAA >60   < > >60 >60 >60  ANIONGAP 6   < > 5 6 7    < > = values in this interval not displayed.     Hematology Recent Labs  Lab 01/11/18 0413 01/12/18 0625 01/13/18 0026 01/13/18 0500  WBC 4.2 4.3  --  4.4  RBC 2.79* 2.70*  2.70*  --  3.65*  HGB 7.6* 7.4* 10.1* 10.2*  HCT 25.0* 23.9* 31.1* 31.6*  MCV 89.6 88.5  --  86.6  MCH 27.2 27.4  --  27.9  MCHC 30.4 31.0  --  32.3  RDW 16.6* 16.6*  --  15.9*  PLT 231 223  --  251    Cardiac EnzymesNo results for input(s): TROPONINI in the last 168 hours. No results for input(s): TROPIPOC in the last 168 hours.   BNPNo results for input(s): BNP, PROBNP in the last 168 hours.   DDimer No results for input(s): DDIMER in the last 168 hours.   Radiology    No results found.  Cardiac Studies   01/07/2018 Cardiac Cath  Ost Ramus to Ramus lesion is 70% stenosed.  Ost LAD lesion is 40% stenosed.  Prox LAD lesion is 50% stenosed.  Prox LAD to Mid LAD lesion is 70% stenosed.  Prox Cx to Mid Cx lesion is 70% stenosed.  Mid Cx to Dist Cx lesion is 60% stenosed.  Mid RCA lesion is 85% stenosed.  Prox RCA lesion is 20% stenosed.  Dist RCA  lesion is 50% stenosed.  RPDA lesion is 70% stenosed.   Multi-vessel CAD in this diabetic female with diffuse disease and diabetic appearing vessels.  There is smooth 40% ostial LAD stenosis, diffuse 50% proximal LAD stenosis, significant eccentric calcification in the mid LAD after the diagonal vessel with narrowing of at least 70% and diffuse distal LAD narrowing; diffusely narrowed proximal to mid ramus intermediate vessel; large left circumflex coronary artery with 70% followed by 60% mid AV groove stenosis after the takeoff of the first marginal vessel; and small caliber RCA with diffuse disease with 20% proximal RCA narrowing,  focal 85% mid stenosis, diffuse 50% narrowing before the PDA takeoff, 70% mid PDA stenosis.  Moderately severe global LV dysfunction with an ejection fraction of 30 to 35%.  LVEDP 23 mm.  RECOMMENDATION: Angiograms will be reviewed with colleagues.  Recommend surgical consultation for consideration of CABG revascularization in this 52 year old diabetic female with a 41-year history of diabetes mellitus.  The patient's tightest lesion is her mid RCA but this would not account for her troponin of 40.47.  Increased medical therapy.  Hydrate post catheterization.  Will reinstitute heparin therapy for anticoagulation 10 hours post sheath removal.  2D echo 01/05/2018 Study Conclusions  - Left ventricle: The cavity size was normal. Wall thickness was   normal. The estimated ejection fraction was 55%. There is   hypokinesis of the apicalinferior myocardium. Left ventricular   diastolic function parameters were normal. - Mitral valve: There was mild regurgitation. - Left atrium: The atrium was mildly dilated. - Right atrium: Central venous pressure (est): 8 mm Hg. - Tricuspid valve: There was trivial regurgitation. - Pulmonary arteries: Systolic pressure could not be accurately   estimated. - Pericardium, extracardiac: There was no pericardial effusion.   Patient  Profile     52 y.o. female with a hx of diabetes mellitus, diabetic  gastroparesis medical noncompliance, depressionand admitted 01/04/18 for abd pain and back pain and DKA started on insulin also AKI with Cr 2.37 from 1.3 to 1.7 her usual. Troponin pk of 40.47 now decreasing. EKG with inf lat ST depression but similar to prior EKGs.   Assessment & Plan    1.  Severe multivessel ASCAD -plan for CABG tomorrow -Continue aspirin 81 mg daily, IV heparin drip, Lopressor 25 mg twice daily, statin.  2.  NSTEMI -Denies any further chest pain -Troponin peaked at 40.47 is trending downward -Continue aspirin, IV heparin, beta-blocker and statin. -Cath with severe three-vessel coronary disease for CABG tomorrow  3.  Hyperlipidemia -LDL goal less than 70 -LDL was 54 on 01/07/2018 -To high-dose statin with Lipitor 80 mg daily  4.  Insulin-dependent diabetes mellitus -HbA1C 8.6 -Blood sugar running 126-262 Continue insulin coverage per TRH  5.  Elevated BP -BP running 161/85 mmHg -Increase Lopressor to 50 mg twice daily -She has already received her a.m. dose of 25 mg Lopressor so I will give her another 25 mg now      For questions or updates, please contact [place] Please consult [email or web address] for contact info under Cardiology/STEMI.      Signed, Fransico Him, MD  01/13/2018, 9:06 AM

## 2018-01-13 NOTE — Progress Notes (Addendum)
ANTICOAGULATION CONSULT NOTE   Pharmacy Consult for IV heparin Indication: chest pain/ACS   Patient Measurements: Height: 5\' 1"  (154.9 cm) Weight: 145 lb 11.6 oz (66.1 kg) IBW/kg (Calculated) : 47.8 Heparin Dosing Weight: 68 kg   Vital Signs: Temp: 98.5 F (36.9 C) (07/22 0801) Temp Source: Oral (07/22 0801) BP: 158/78 (07/22 0801) Pulse Rate: 81 (07/22 0801)  Labs: Recent Labs    01/11/18 0413 01/12/18 0400 01/12/18 0625 01/13/18 0026 01/13/18 0500  HGB 7.6*  --  7.4* 10.1* 10.2*  HCT 25.0*  --  23.9* 31.1* 31.6*  PLT 231  --  223  --  251  HEPARINUNFRC 0.44 0.40  --   --  0.38  CREATININE 1.04*  --  0.96  --  0.94    Medical History: Past Medical History:  Diagnosis Date  . Depression   . Diabetes mellitus without complication ([place])   . Essential hypertension   . Gastroparesis   . GERD (gastroesophageal reflux disease)   . HLD (hyperlipidemia)     Assessment: 52 year old female admitted with ACS found to have severe 3 vessel CAD. Pt continues on heparin while awaiting decision on CABG. Heparin level is therapeutic today. Hemoglobin is better today after receiving 2 units pRBC yesterday. Iron stores are low, patient received Feraheme x1 dose yesterday.   Goal of Therapy:  Heparin level 0.3-0.7 units/ml Monitor platelets by anticoagulation protocol: Yes    Plan:  -Continue heparin 1050 units/hr -Monitor daily heparin levels and CBC -F/u plans for CABG    Harvel Quale 01/13/2018 8:51 AM

## 2018-01-13 NOTE — Progress Notes (Signed)
Pt watching video on call #[phone]  for CABG tomorrow

## 2018-01-13 NOTE — Plan of Care (Signed)
Continue current care plan 

## 2018-01-13 NOTE — Anesthesia Preprocedure Evaluation (Addendum)
Anesthesia Evaluation  Patient identified by MRN, date of birth, ID band Patient awake    Reviewed: Allergy & Precautions, NPO status , Patient's Chart, lab work & pertinent test results, reviewed documented beta blocker date and time   Airway Mallampati: III  TM Distance: >3 FB Neck ROM: Full    Dental no notable dental hx. (+) Teeth Intact   Pulmonary neg pulmonary ROS,    Pulmonary exam normal breath sounds clear to auscultation       Cardiovascular hypertension, Pt. on home beta blockers + CAD  Normal cardiovascular exam Rhythm:Regular Rate:Normal  ECG: NSR, rate 87   CATH Ost Ramus to Ramus lesion is 70% stenosed. Ost LAD lesion is 40% stenosed. Prox LAD lesion is 50% stenosed. Prox LAD to Mid LAD lesion is 70% stenosed. Prox Cx to Mid Cx lesion is 70% stenosed. Mid Cx to Dist Cx lesion is 60% stenosed. Mid RCA lesion is 85% stenosed. Prox RCA lesion is 20% stenosed. Dist RCA lesion is 50% stenosed. RPDA lesion is 70% stenosed.  Multi-vessel CAD in this diabetic female with diffuse disease and diabetic  appearing vessels. There is smooth 40% ostial LAD stenosis, diffuse 50%  proximal LAD stenosis, significant eccentric calcification in the mid LAD after the diagonal vessel with narrowing of at least 70% and diffuse distal LAD narrowing; diffusely narrowed proximal to mid ramus intermediate vessel; large left circumflex coronary artery with 70%  followed by 60% mid AV groove stenosis after the takeoff of the first marginal vessel; and small caliber RCA with diffuse disease with 20%  proximal RCA narrowing, focal 85% mid stenosis, diffuse 50% narrowing  before the PDA takeoff, 70% mid PDA stenosis. Moderately severe global LV dysfunction with an ejection fraction of 30 to 35%. LVEDP 23 mm.  ECHO: Left ventricle: The cavity size was normal. Wall thickness was normal. The estimated ejection fraction was 55%. Regional wall  motion abnormalities: There is hypokinesis of the apicalinferior myocardium. Left ventricular diastolic function parameters were normal.     Neuro/Psych PSYCHIATRIC DISORDERS Anxiety Depression negative neurological ROS     GI/Hepatic Neg liver ROS, GERD  Medicated,  Endo/Other  diabetes  Renal/GU negative Renal ROS     Musculoskeletal negative musculoskeletal ROS (+)   Abdominal   Peds  Hematology  (+) anemia , HLD   Anesthesia Other Findings   Reproductive/Obstetrics                           Anesthesia Physical Anesthesia Plan  ASA: IV  Anesthesia Plan: General   Post-op Pain Management:    Induction: Intravenous  PONV Risk Score and Plan: 3 and Ondansetron, Dexamethasone, Treatment may vary due to age or medical condition and Midazolam  Airway Management Planned: Oral ETT  Additional Equipment: Arterial line, CVP, PA Cath, TEE and Ultrasound Guidance Line Placement  Intra-op Plan:   Post-operative Plan: Post-operative intubation/ventilation  Informed Consent: I have reviewed the patients History and Physical, chart, labs and discussed the procedure including the risks, benefits and alternatives for the proposed anesthesia with the patient or authorized representative who has indicated his/her understanding and acceptance.   Dental advisory given  Plan Discussed with: CRNA  Anesthesia Plan Comments:         Anesthesia Quick Evaluation

## 2018-01-13 NOTE — Progress Notes (Signed)
Physical Therapy Treatment Patient Details Name: [name] MRN: [id number] DOB: 1966-03-19 Today's Date: 01/13/2018    History of Present Illness Pt is a 52 y.o. female with h/o uncontrolled DM type 1, admitted 01/04/18 with DKA; found to have NSTEMI. Cardiac cath 7/16 showed multivessel severe CAD. Planning for CABG 7/23. PMH includes HTN, DM, anxiety, depression.    PT Comments    Pt with increased gait tolerance today with encouragement but continues to state decreased balance with need for UE support with gait. Pt uanable to recall precautions educated for last session and again educated for all sternal precautions with pt able to maintain with activity with cues. Encouraged increased mobility and HEP to maximize strength and function post op as well.     Follow Up Recommendations  Home health PT;Supervision - Intermittent     Equipment Recommendations  Rolling walker with 5" wheels    Recommendations for Other Services       Precautions / Restrictions Precautions Precautions: Fall    Mobility  Bed Mobility Overal bed mobility: Modified Independent Bed Mobility: Supine to Sit           General bed mobility comments: increased time  Transfers Overall transfer level: Needs assistance   Transfers: Sit to/from Stand Sit to Stand: Supervision         General transfer comment: increased time with cues for hands on thighs, able to scoot forward and back reciprocally with cues  Ambulation/Gait Ambulation/Gait assistance: Min guard Gait Distance (Feet): 280 Feet Assistive device: IV Pole Gait Pattern/deviations: Step-through pattern;Decreased stride length;Trunk flexed   Gait velocity interpretation: 1.31 - 2.62 ft/sec, indicative of limited community ambulator General Gait Details: pt holding IV pole with bil UE with pt declining RW use this session but states she needs IV pole due to feeling unsteady. Pt educated for need to transition to RW with all future  gait trials for increased functional mobility and safety.    Stairs             Wheelchair Mobility    Modified Rankin (Stroke Patients Only)       Balance Overall balance assessment: Needs assistance   Sitting balance-Leahy Scale: Good       Standing balance-Leahy Scale: Fair                              Cognition Arousal/Alertness: Awake/alert Behavior During Therapy: Flat affect Overall Cognitive Status: No family/caregiver present to determine baseline cognitive functioning                     Current Attention Level: Alternating         Problem Solving: Slow processing        Exercises General Exercises - Lower Extremity Long Arc Quad: AROM;10 reps;Seated;Both Hip Flexion/Marching: AROM;10 reps;Seated;Both    General Comments        Pertinent Vitals/Pain Pain Assessment: No/denies pain    Home Living                      Prior Function            PT Goals (current goals can now be found in the care plan section) Progress towards PT goals: Progressing toward goals    Frequency           PT Plan Current plan remains appropriate    Co-evaluation  AM-PAC PT "6 Clicks" Daily Activity  Outcome Measure  Difficulty turning over in bed (including adjusting bedclothes, sheets and blankets)?: None Difficulty moving from lying on back to sitting on the side of the bed? : A Little Difficulty sitting down on and standing up from a chair with arms (e.g., wheelchair, bedside commode, etc,.)?: A Little Help needed moving to and from a bed to chair (including a wheelchair)?: A Little Help needed walking in hospital room?: A Little Help needed climbing 3-5 steps with a railing? : A Little 6 Click Score: 19    End of Session Equipment Utilized During Treatment: Gait belt Activity Tolerance: Patient tolerated treatment well Patient left: in chair;with call bell/phone within reach Nurse  Communication: Mobility status PT Visit Diagnosis: Muscle weakness (generalized) (M62.81);Other abnormalities of gait and mobility (R26.89)     Time: 7915-0569 PT Time Calculation (min) (ACUTE ONLY): 18 min  Charges:  $Gait Training: 8-22 mins                    G Codes:       Elwyn Reach, PT [phone]    [place] 01/13/2018, 11:36 AM

## 2018-01-13 NOTE — Progress Notes (Signed)
CARDIAC REHAB PHASE I   PRE:  Rate/Rhythm: 77 SR    BP: sitting 149/71    SaO2: 97 RA  MODE:  Ambulation: 340 ft   POST:  Rate/Rhythm: 93 SR    BP: sitting 157/73     SaO2: 97 RA  Pt with swollen arms. Small steps with close stance. Had her use RW although she prefers the IV pole. Slow, steady gait. Sts she is always SOB "my whole life". Return to recliner. Pt is quite slow moving. Asked appropriate questions regarding surgery. She is nervous. Practiced IS with consistently 700 mL.  [id number] [place], ACSM 01/13/2018 9:57 AM

## 2018-01-13 NOTE — Progress Notes (Signed)
[facility]                                                                              Patient Demographics  [name], is a 52 y.o. female, DOB - 08/08/1965, YJE:[id number]  Admit date - 01/04/2018   Admitting Physician Cristal Ford, DO  Outpatient Primary MD for the patient is [name], MD  Outpatient specialists:   LOS - 9  days   Medical records reviewed and are as summarized below:    Chief Complaint  Patient presents with  . Hyperglycemia       Brief summary  [name] is a 52 y.o. femalewith a medical history ofdiabetes mellitus, medical noncompliance, depression. She presented with DKA, started on IV insulin and now found to have an NSTEMI. Started on heparin drip. DKA resolved.  Assessment & Plan    Principal Problem:   DKA (diabetic ketoacidoses) ([place]) with underlying history of uncontrolled type 1 diabetes mellitus -Patient has recurrent admissions for DKA, currently resolved, patient has been transitioned to subcutaneous insulin -CBGs, fasting 136.  Continue current insulin regimen.  Levemir 20 units at bedtime, NovoLog meal coverage 5 units 3 times daily AC, sliding scale insulin.   -Hemoglobin A1c 8.6  Active Problems:   Non-ST elevation (NSTEMI) myocardial infarction ([place]), troponin peaked at 40.47 -Currently no chest pain.  Underwent cardiac cath on 7/16 which showed multivessel severe coronary disease, recommended surgical consultation for CABG -2D echo showed EF of 55% with hypokinesis of inferior and apical walls -Continue IV heparin  -CABG planned in a.m. 7/23  Acute on chronic iron deficiency anemia -Anemia panel obtained, iron 14, ferritin 20, saturation ratios 5, previous history of iron deficiency anemia and has received IV iron in the past -Hemoglobin 7.4 on 7/21, transfuse 2 units packed RBCs, also received Feraheme x 1 dose on 7/21 -H&H currently stable    Anxiety and depression Continue BuSpar   AKI (acute kidney injury) ([place]) - admitted with creatinine of 2.1, -Creatinine stable 0.9  Diarrhea -Resolved per patient, C. difficile negative  -Continue Lomotil as needed     essential hypertension -BP BP somewhat elevated, increased metoprolol  Hypokalemia with hypomagnesemia -Resolved  Lactic acidosis with sepsis ruled out, -No source of infection, likely secondary to DKA and dehydration   Code Status full code DVT Prophylaxis: Heparin drip Family Communication: Discussed in detail with the patient, all imaging results, lab results explained to the patient     Disposition Plan: CABG in a.m. 7/23  Time Spent in minutes   25 minutes   Procedures:  Cardiac cath on 7/16  Consultants:   Cardiology TCTS  Antimicrobials:      Medications  Scheduled Meds: . aspirin  81 mg Oral Daily  . atorvastatin  80 mg Oral q1800  . busPIRone  10 mg Oral TID  . Chlorhexidine Gluconate Cloth  6 each Topical Daily  . fluticasone  2 spray Each Nare Daily  . insulin aspart  0-5 Units Subcutaneous QHS  . insulin aspart  0-9 Units Subcutaneous TID WC  . insulin aspart  5 Units Subcutaneous TID WC  . insulin detemir  20  Units Subcutaneous QHS  . metoprolol tartrate  50 mg Oral BID  . pantoprazole  40 mg Oral Q1200  . potassium chloride  40 mEq Oral Daily  . pregabalin  150 mg Oral Daily  . pyridostigmine  30 mg Oral TID WC  . sodium chloride flush  10-40 mL Intracatheter Q12H  . sodium chloride flush  3 mL Intravenous Q12H   Continuous Infusions: . sodium chloride    . heparin 1,050 Units/hr (01/12/18 1900)   PRN Meds:.sodium chloride, acetaminophen **OR** acetaminophen, diazepam, loperamide, ondansetron (ZOFRAN) IV, ondansetron **OR** [DISCONTINUED] ondansetron (ZOFRAN) IV, promethazine, sodium chloride flush, sodium chloride flush   Antibiotics   Anti-infectives (From admission, onward)   Start     Dose/Rate Route Frequency Ordered Stop   01/10/18 0400  vancomycin  (VANCOCIN) 1,250 mg in sodium chloride 0.9 % 250 mL IVPB  Status:  Discontinued     1,250 mg 166.7 mL/hr over 90 Minutes Intravenous To Surgery 01/09/18 1311 01/09/18 1929   01/10/18 0400  levofloxacin (LEVAQUIN) IVPB 500 mg  Status:  Discontinued     500 mg 100 mL/hr over 60 Minutes Intravenous To Surgery 01/09/18 1311 01/09/18 1929        Subjective:   [name] was seen and examined today.  No complaints, sitting in the chair, anxious for the CABG on 7/23.  Otherwise no chest pain, shortness of breath, abdominal pain, nausea vomiting or diarrhea.   Objective:   Vitals:   01/12/18 2301 01/13/18 0514 01/13/18 0728 01/13/18 0801  BP: (!) 147/69  (!) 161/85 (!) 158/78  Pulse: 82  88 81  Resp: (!) 24  (!) 28 (!) 26  Temp: 98.2 F (36.8 C) 98 F (36.7 C) 98.4 F (36.9 C) 98.5 F (36.9 C)  TempSrc: Oral Oral Oral Oral  SpO2: 100%  98% 94%  Weight:      Height:        Intake/Output Summary (Last 24 hours) at 01/13/2018 1113 Last data filed at 01/13/2018 0805 Gross per 24 hour  Intake 1225 ml  Output 725 ml  Net 500 ml     Wt Readings from Last 3 Encounters:  01/05/18 66.1 kg (145 lb 11.6 oz)  12/23/17 68 kg (150 lb)  12/10/17 65 kg (143 lb 4.8 oz)     Exam   General: Alert and oriented x 3, NAD  Eyes:   HEENT:  Atraumatic, normocephalic  Cardiovascular: S1 S2 clear, RRR No pedal edema b/l  Respiratory: CTAB  Gastrointestinal: Soft, nontender, nondistended, + bowel sounds  Ext: no pedal edema bilaterally  Neuro: no new deficits  Musculoskeletal: No digital cyanosis, clubbing  Skin: No rashes  Psych: Normal affect and demeanor, alert and oriented x3    Data Reviewed:  I have personally reviewed following labs and imaging studies  Micro Results Recent Results (from the past 240 hour(s))  MRSA PCR Screening     Status: None   Collection Time: 01/04/18  3:41 PM  Result Value Ref Range Status   MRSA by PCR NEGATIVE NEGATIVE Final    Comment:         The GeneXpert MRSA Assay (FDA approved for NASAL specimens only), is one component of a comprehensive MRSA colonization surveillance program. It is not intended to diagnose MRSA infection nor to guide or monitor treatment for MRSA infections. Performed at [facility], [place] [address], [place], [place] [address]   C difficile quick scan w PCR reflex     Status: None  Collection Time: 01/06/18  6:37 AM  Result Value Ref Range Status   C Diff antigen NEGATIVE NEGATIVE Final   C Diff toxin NEGATIVE NEGATIVE Final   C Diff interpretation No C. difficile detected.  Final    Comment: Performed at [facility], [place] [address] [place], [place] [address]    Radiology Reports Dg Abd 1 View  Result Date: 01/04/2018 CLINICAL DATA:  Onset of abdominal pain. EXAM: ABDOMEN - 1 VIEW COMPARISON:  12/23/2017 FINDINGS: Bowel gas pattern is nonobstructive. Again noted is gaseous distension of the stomach, raising the question of aerophagia. Surgical clips are noted in the RIGHT UPPER QUADRANT the abdomen. There is no evidence for free intraperitoneal air. IMPRESSION: 1. No evidence for acute abnormality. 2. Persistent gaseous distension of the stomach. Question of aerophagia. Electronically Signed   By: Nolon Nations M.D.   On: 01/04/2018 16:50   Dg Chest Port 1 View  Result Date: 01/07/2018 CLINICAL DATA:  PICC placement EXAM: PORTABLE CHEST 1 VIEW COMPARISON:  01/04/2018 FINDINGS: Left PICC line in place with the tip in the SVC. Patchy airspace disease bilaterally, right greater than left. Small right effusion. Heart is normal size. IMPRESSION: Worsening bilateral airspace disease, right greater than left concerning for multifocal pneumonia. Small right effusion. Left PICC line tip in the lower SVC. Electronically Signed   By: Rolm Baptise M.D.   On: 01/07/2018 10:11   Dg Chest Portable 1 View  Result Date: 01/04/2018 CLINICAL DATA:  History of diabetes.   Tachypnea. EXAM: PORTABLE CHEST 1 VIEW COMPARISON:  December 23, 2017 FINDINGS: The heart size and mediastinal contours are within normal limits. Both lungs are clear. The visualized skeletal structures are unremarkable. IMPRESSION: No active disease. Electronically Signed   By: Dorise Bullion III M.D   On: 01/04/2018 11:39   Dg Abd Acute W/chest  Result Date: 12/23/2017 CLINICAL DATA:  Abdominal and back pain. Shortness of breath. History of gastroparesis. EXAM: DG ABDOMEN ACUTE W/ 1V CHEST COMPARISON:  Single-view of the chest 12/10/2017. Plain films chest and abdomen 11/22/2017. FINDINGS: Single-view of the chest demonstrates clear lungs and normal heart size. No pneumothorax or pleural effusion. Two views of the abdomen show no free intraperitoneal air. There is gaseous distention of the stomach. Small and large bowel are unremarkable. No unexpected abdominal calcification or focal bony abnormality. IMPRESSION: Gaseous distention of the stomach may be related to the patient's history of diabetic gastroparesis. The examination is otherwise negative. Electronically Signed   By: [name] Rise M.D.   On: 12/23/2017 19:01   Irpicc Placement Left >5 Yrs Inc Img Guide  Result Date: 01/06/2018 CLINICAL DATA:  STEMI, poor venous access EXAM: PICC PLACEMENT WITH ULTRASOUND AND FLUOROSCOPY FLUOROSCOPY TIME:  0.1 minute; 34 uGym2 DAP TECHNIQUE: After written informed consent was obtained, patient was placed in the supine position on angiographic table. Patency of the left basilic vein was confirmed with ultrasound with image documentation. An appropriate skin site was determined. Skin site was marked. Region was prepped using maximum barrier technique including cap and mask, sterile gown, sterile gloves, large sterile sheet, and Chlorhexidine as cutaneous antisepsis. The region was infiltrated locally with 1% lidocaine. Under real-time ultrasound guidance, the left basilic vein was accessed with a 21 gauge  micropuncture needle; the needle tip within the vein was confirmed with ultrasound image documentation. Needle exchanged over a 018 guidewire for a peel-away sheath, through which a 5-French double-lumen power injectable PICC trimmed to 35cm was advanced, positioned with its  tip near the cavoatrial junction. Spot chest radiograph confirms appropriate catheter position. Catheter was flushed per protocol and secured externally. The patient tolerated procedure well. COMPLICATIONS: COMPLICATIONS none IMPRESSION: 1. Technically successful five [place] double lumen power injectable PICC placement Electronically Signed   By: Lucrezia Europe M.D.   On: 01/06/2018 16:55   Irpicc Placement Left >5 Yrs Inc Img Guide  Result Date: 12/23/2017 INDICATION: 52 year old female with a history of DKA EXAM: PICC LINE PLACEMENT WITH ULTRASOUND AND FLUOROSCOPIC GUIDANCE MEDICATIONS: None ANESTHESIA/SEDATION: None FLUOROSCOPY TIME:  Fluoroscopy Time: 0 minutes 42 seconds (1.9 mGy). COMPLICATIONS: None PROCEDURE: Informed written consent was obtained from the patient after a thorough discussion of the procedural risks, benefits and alternatives. All questions were addressed. Maximal Sterile Barrier Technique was utilized including caps, mask, sterile gowns, sterile gloves, sterile drape, hand hygiene and skin antiseptic. A timeout was performed prior to the initiation of the procedure. Patient was position in the supine position on the fluoroscopy table with the right arm abducted 90 degrees. Ultrasound survey of the upper extremity was performed with images stored and sent to PACs. The left brachial vein was selected for access. Once the patient was prepped and draped in the usual sterile fashion, the skin and subcutaneous tissues were generously infiltrated with 1% lidocaine for local anesthesia. A micropuncture access kit was then used to access the targeted vein. Wire was passed centrally, confirmed to be within the venous system under  fluoroscopy. A small stab incision was made with an 11 blade scalpel and the sheath was then placed over the wire. Estimated length of the catheter was then performed with the indwelling wire. Catheter was amputated at 41 cm length and placed with coaxial wire through the peel-away. Double-lumen, power injectable PICC in the left brachial vein. Tip confirmed at the cavoatrial junction, and the catheter is ready for use. Stat lock was placed. Patient tolerated the procedure well and remained hemodynamically stable throughout. No complications were encountered and no significant blood loss was encountered. IMPRESSION: Status post left upper extremity PICC. Signed, Dulcy Fanny. Dellia Nims, RPVI [facility] Radiology PLAN: Anticipate future PICC placement to be difficult, with limited venous access and small caliber veins. Electronically Signed   By: Corrie Mckusick D.O.   On: 12/23/2017 17:33   [place] Ekg Site Rite  Result Date: 01/05/2018 If Site Rite image not attached, placement could not be confirmed due to current cardiac rhythm.   Lab Data:  CBC: Recent Labs  Lab 01/10/18 0500 01/10/18 1417 01/11/18 0413 01/12/18 0625 01/13/18 0026 01/13/18 0500  WBC 4.3 4.7 4.2 4.3  --  4.4  HGB 8.1* 8.4* 7.6* 7.4* 10.1* 10.2*  HCT 26.4* 27.3* 25.0* 23.9* 31.1* 31.6*  MCV 88.3 87.5 89.6 88.5  --  86.6  PLT 220 247 231 223  --  144   Basic Metabolic Panel: Recent Labs  Lab 01/09/18 0505 01/10/18 0500 01/10/18 1417 01/11/18 0413 01/12/18 0625 01/13/18 0500  NA 139 138  --  137 138 139  K 3.0* 3.0*  --  3.5 3.7 3.8  CL 104 104  --  107 108 107  CO2 27 27  --  _0 GLUCOSE 172* 208*  --  262* 237* 126*  BUN 7 7  --  8 <5* 7  CREATININE 1.02* 1.05*  --  1.04* 0.96 0.94  CALCIUM 8.1* 8.0*  --  8.1* 8.2* 8.6*  MG  --  1.6* 1.7  --   --   --  GFR: Estimated Creatinine Clearance: 62.3 mL/min (by C-G formula based on SCr of 0.94 mg/dL). Liver  Function Tests: Recent Labs  Lab 01/07/18 0510  AST 29  ALT 22  ALKPHOS 99  BILITOT 0.6  PROT 5.2*  ALBUMIN 2.4*   No results for input(s): LIPASE, AMYLASE in the last 168 hours. No results for input(s): AMMONIA in the last 168 hours. Coagulation Profile: No results for input(s): INR, PROTIME in the last 168 hours. Cardiac Enzymes: No results for input(s): CKTOTAL, CKMB, CKMBINDEX, TROPONINI in the last 168 hours. BNP (last 3 results) No results for input(s): PROBNP in the last 8760 hours. HbA1C: No results for input(s): HGBA1C in the last 72 hours. CBG: Recent Labs  Lab 01/12/18 0819 01/12/18 1214 01/12/18 1743 01/12/18 2131 01/13/18 0758  GLUCAP 201* 106* 200* 176* 131*   Lipid Profile: No results for input(s): CHOL, HDL, LDLCALC, TRIG, CHOLHDL, LDLDIRECT in the last 72 hours. Thyroid Function Tests: No results for input(s): TSH, T4TOTAL, FREET4, T3FREE, THYROIDAB in the last 72 hours. Anemia Panel: Recent Labs    01/12/18 0625 01/12/18 0640  VITAMINB12 266  --   FOLATE  --  11.2  FERRITIN 20  --   TIBC 283  --   IRON 14*  --   RETICCTPCT 2.9  --    Urine analysis:    Component Value Date/Time   COLORURINE YELLOW 01/06/2018 2008   APPEARANCEUR CLEAR 01/06/2018 2008   LABSPEC 1.015 01/06/2018 2008   PHURINE 6.0 01/06/2018 2008   GLUCOSEU >=500 (A) 01/06/2018 2008   HGBUR NEGATIVE 01/06/2018 2008   BILIRUBINUR NEGATIVE 01/06/2018 2008   BILIRUBINUR neg 01/17/2017 1533   KETONESUR 5 (A) 01/06/2018 2008   PROTEINUR 30 (A) 01/06/2018 2008   UROBILINOGEN 0.2 01/17/2017 1533   UROBILINOGEN 0.2 04/27/2015 1608   NITRITE NEGATIVE 01/06/2018 2008   LEUKOCYTESUR NEGATIVE 01/06/2018 2008     Ripudeep Rai M.D. [facility] 01/13/2018, 11:13 AM  Pager: [phone] Between 7am to 7pm - call Pager - [phone]  After 7pm go to [email or web address] - password [email or web address]  Call night coverage person covering after 7pm

## 2018-01-13 NOTE — Progress Notes (Signed)
Patient ID: [name], female   DOB: [birth year], 52 y.o.   MRN: [id number]      [address]       [place] [address]             [phone]                 6 Days Post-Op Procedure(s) (LRB): LEFT HEART CATH AND CORONARY ANGIOGRAPHY (N/A)  LOS: 9 days   Subjective: Overall the patient feels better than last week, she is now agreeable to proceed with coronary artery bypass grafting  Objective: Vital signs in last 24 hours: Patient Vitals for the past 24 hrs:  BP Temp Temp src Pulse Resp SpO2  01/13/18 1537 (!) 147/71 98.7 F (37.1 C) Oral 72 (!) 23 97 %  01/13/18 1123 (!) 153/76 98.1 F (36.7 C) Oral 74 16 97 %  01/13/18 1113 - - - - (!) 21 -  01/13/18 0801 (!) 158/78 98.5 F (36.9 C) Oral 81 (!) 26 94 %  01/13/18 0728 (!) 161/85 98.4 F (36.9 C) Oral 88 (!) 28 98 %  01/13/18 0514 - 98 F (36.7 C) Oral - - -  01/12/18 2301 (!) 147/69 98.2 F (36.8 C) Oral 82 (!) 24 100 %  01/12/18 2021 - 98.4 F (36.9 C) Oral 88 - 100 %  01/12/18 1951 (!) 166/77 98.7 F (37.1 C) Oral 88 19 100 %  01/12/18 1815 (!) 158/81 97.7 F (36.5 C) Oral - - -    Filed Weights   01/05/18 2106  Weight: 145 lb 11.6 oz (66.1 kg)    Hemodynamic parameters for last 24 hours:    Intake/Output from previous day: 07/21 0701 - 07/22 0700 In: 1225 [I.V.:565; Blood:660] Out: 500 [Urine:500] Intake/Output this shift: Total I/O In: 720 [P.O.:720] Out: 975 [Urine:975]  Scheduled Meds: . aspirin  81 mg Oral Daily  . atorvastatin  80 mg Oral q1800  . busPIRone  10 mg Oral TID  . Chlorhexidine Gluconate Cloth  6 each Topical Daily  . fluticasone  2 spray Each Nare Daily  . [START ON 01/14/2018] heparin-papaverine-plasmalyte irrigation   Irrigation To OR  . insulin aspart  0-5 Units Subcutaneous QHS  . insulin aspart  0-9 Units Subcutaneous TID WC  . insulin aspart  5 Units Subcutaneous TID WC  . insulin detemir  20 Units Subcutaneous QHS  . [START ON 01/14/2018] magnesium  sulfate  40 mEq Other To OR  . metoprolol tartrate  50 mg Oral BID  . pantoprazole  40 mg Oral Q1200  . [START ON 01/14/2018] potassium chloride  80 mEq Other To OR  . potassium chloride  40 mEq Oral Daily  . pregabalin  150 mg Oral Daily  . pyridostigmine  30 mg Oral TID WC  . sodium chloride flush  10-40 mL Intracatheter Q12H  . sodium chloride flush  3 mL Intravenous Q12H  . [START ON 01/14/2018] tranexamic acid  15 mg/kg Intravenous To OR  . [START ON 01/14/2018] tranexamic acid  2 mg/kg Intracatheter To OR   Continuous Infusions: . sodium chloride    . [START ON 01/14/2018] dexmedetomidine    . [START ON 01/14/2018] DOPamine    . [START ON 01/14/2018] epinephrine    . [START ON 01/14/2018] heparin 30,000 units/NS 1000 mL solution for CELLSAVER    . heparin 1,050 Units/hr (01/12/18 1900)  . [START ON 01/14/2018] insulin (NOVOLIN-R) infusion    . [START ON 01/14/2018] levofloxacin (LEVAQUIN) IV    . [  START ON 01/14/2018] nitroGLYCERIN    . [START ON 01/14/2018] phenylephrine 20mg /237mL NS (0.08mg /ml) infusion    . [START ON 01/14/2018] tranexamic acid (CYKLOKAPRON) infusion ([facility])    . [START ON 01/14/2018] vancomycin     PRN Meds:.sodium chloride, acetaminophen **OR** acetaminophen, diazepam, loperamide, ondansetron (ZOFRAN) IV, ondansetron **OR** [DISCONTINUED] ondansetron (ZOFRAN) IV, promethazine, sodium chloride flush, sodium chloride flush  General appearance: alert, cooperative and no distress Neurologic: intact Heart: regular rate and rhythm, S1, S2 normal, no murmur, click, rub or gallop Lungs: clear to auscultation bilaterally Abdomen: soft, non-tender; bowel sounds normal; no masses,  no organomegaly Extremities: extremities normal, atraumatic, no cyanosis or edema and Homans sign is negative, no sign of DVT PICC line in left arm  Lab Results: CBC: Recent Labs    01/12/18 0625 01/13/18 0026 01/13/18 0500  WBC 4.3  --  4.4  HGB 7.4* 10.1* 10.2*  HCT 23.9* 31.1* 31.6*  PLT  223  --  251   BMET:  Recent Labs    01/12/18 0625 01/13/18 0500  NA 138 139  K 3.7 3.8  CL 108 107  CO2 24 25  GLUCOSE 237* 126*  BUN <5* 7  CREATININE 0.96 0.94  CALCIUM 8.2* 8.6*    PT/INR: No results for input(s): LABPROT, INR in the last 72 hours.   Radiology No results found.   Assessment/Plan: S/P Procedure(s) (LRB): LEFT HEART CATH AND CORONARY ANGIOGRAPHY (N/A) Plan to proceed with coronary artery bypass grafting tomorrow The goals risks and alternatives of the planned surgical procedure coronary artery bypass grafting have been discussed with the patient in detail. The risks of the procedure including death, infection, stroke, myocardial infarction, bleeding, blood transfusion have all been discussed specifically.  I have quoted [name] a 5% of perioperative mortality and a complication rate as high as 50 %. The patient's questions have been answered.[name] is willing  to proceed with the planned procedure.   Grace Isaac MD 01/13/2018 5:26 PM

## 2018-01-14 ENCOUNTER — Inpatient Hospital Stay (HOSPITAL_COMMUNITY)
Admission: EM | Disposition: A | Discharge status: Skilled Nursing Facility | Payer: Self-pay | Source: Home / Self Care | Attending: Cardiothoracic Surgery

## 2018-01-14 ENCOUNTER — Encounter (HOSPITAL_COMMUNITY): Payer: Self-pay | Admitting: Anesthesiology

## 2018-01-14 ENCOUNTER — Inpatient Hospital Stay (HOSPITAL_COMMUNITY): Payer: Medicare Other

## 2018-01-14 ENCOUNTER — Inpatient Hospital Stay (HOSPITAL_COMMUNITY): Payer: Medicare Other | Admitting: Certified Registered Nurse Anesthetist

## 2018-01-14 DIAGNOSIS — I25110 Atherosclerotic heart disease of native coronary artery with unstable angina pectoris: Secondary | ICD-10-CM

## 2018-01-14 DIAGNOSIS — Z951 Presence of aortocoronary bypass graft: Secondary | ICD-10-CM

## 2018-01-14 HISTORY — PX: TEE WITHOUT CARDIOVERSION: SHX5443

## 2018-01-14 HISTORY — PX: CORONARY ARTERY BYPASS GRAFT: SHX141

## 2018-01-14 LAB — CREATININE, SERUM
Creatinine, Ser: 0.98 mg/dL (ref 0.44–1.00)
GFR calc Af Amer: >60 mL/min (ref >60)
GFR calc non Af Amer: >60 mL/min (ref >60)

## 2018-01-14 LAB — MAGNESIUM: Magnesium: 3 mg/dL — ABNORMAL HIGH (ref 1.7–2.4)

## 2018-01-14 LAB — POCT I-STAT, CHEM 8
BUN: 8 mg/dL (ref 6–20)
BUN: 7 mg/dL (ref 6–20)
BUN: 6 mg/dL (ref 6–20)
BUN: 7 mg/dL (ref 6–20)
BUN: 6 mg/dL (ref 6–20)
BUN: 6 mg/dL (ref 6–20)
BUN: 7 mg/dL (ref 6–20)
CALCIUM ION: 1.26 mmol/L (ref 1.15–1.40)
CALCIUM ION: 1.24 mmol/L (ref 1.15–1.40)
CALCIUM ION: 0.99 mmol/L — AB (ref 1.15–1.40)
CALCIUM ION: 1.25 mmol/L (ref 1.15–1.40)
CALCIUM ION: 1.21 mmol/L (ref 1.15–1.40)
CHLORIDE: 103 mmol/L (ref 98–111)
CHLORIDE: 101 mmol/L (ref 98–111)
CHLORIDE: 102 mmol/L (ref 98–111)
CHLORIDE: 105 mmol/L (ref 98–111)
CREATININE: 0.7 mg/dL (ref 0.44–1.00)
CREATININE: 0.8 mg/dL (ref 0.44–1.00)
Calcium, Ion: 1.12 mmol/L — ABNORMAL LOW (ref 1.15–1.40)
Calcium, Ion: 1.17 mmol/L (ref 1.15–1.40)
Chloride: 100 mmol/L (ref 98–111)
Chloride: 104 mmol/L (ref 98–111)
Chloride: 102 mmol/L (ref 98–111)
Creatinine, Ser: 0.8 mg/dL (ref 0.44–1.00)
Creatinine, Ser: 0.8 mg/dL (ref 0.44–1.00)
Creatinine, Ser: 0.6 mg/dL (ref 0.44–1.00)
Creatinine, Ser: 0.8 mg/dL (ref 0.44–1.00)
Creatinine, Ser: 0.8 mg/dL (ref 0.44–1.00)
GLUCOSE: 168 mg/dL — AB (ref 70–99)
GLUCOSE: 116 mg/dL — AB (ref 70–99)
GLUCOSE: 103 mg/dL — AB (ref 70–99)
Glucose, Bld: 109 mg/dL — ABNORMAL HIGH (ref 70–99)
Glucose, Bld: 98 mg/dL (ref 70–99)
Glucose, Bld: 101 mg/dL — ABNORMAL HIGH (ref 70–99)
Glucose, Bld: 170 mg/dL — ABNORMAL HIGH (ref 70–99)
HCT: 27 % — ABNORMAL LOW (ref 36.0–46.0)
HCT: 25 % — ABNORMAL LOW (ref 36.0–46.0)
HCT: 24 % — ABNORMAL LOW (ref 36.0–46.0)
HCT: 38 % (ref 36.0–46.0)
HEMATOCRIT: 25 % — AB (ref 36.0–46.0)
HEMATOCRIT: 26 % — AB (ref 36.0–46.0)
HEMATOCRIT: 26 % — AB (ref 36.0–46.0)
HEMOGLOBIN: 9.2 g/dL — AB (ref 12.0–15.0)
HEMOGLOBIN: 8.5 g/dL — AB (ref 12.0–15.0)
HEMOGLOBIN: 8.2 g/dL — AB (ref 12.0–15.0)
HEMOGLOBIN: 8.8 g/dL — AB (ref 12.0–15.0)
Hemoglobin: 8.5 g/dL — ABNORMAL LOW (ref 12.0–15.0)
Hemoglobin: 8.8 g/dL — ABNORMAL LOW (ref 12.0–15.0)
Hemoglobin: 12.9 g/dL (ref 12.0–15.0)
POTASSIUM: 4.1 mmol/L (ref 3.5–5.1)
POTASSIUM: 4.3 mmol/L (ref 3.5–5.1)
POTASSIUM: 4.1 mmol/L (ref 3.5–5.1)
Potassium: 3.9 mmol/L (ref 3.5–5.1)
Potassium: 3.8 mmol/L (ref 3.5–5.1)
Potassium: 4.3 mmol/L (ref 3.5–5.1)
Potassium: 5.4 mmol/L — ABNORMAL HIGH (ref 3.5–5.1)
SODIUM: 139 mmol/L (ref 135–145)
SODIUM: 140 mmol/L (ref 135–145)
SODIUM: 141 mmol/L (ref 135–145)
SODIUM: 140 mmol/L (ref 135–145)
Sodium: 136 mmol/L (ref 135–145)
Sodium: 141 mmol/L (ref 135–145)
Sodium: 139 mmol/L (ref 135–145)
TCO2: 29 mmol/L (ref 22–32)
TCO2: 28 mmol/L (ref 22–32)
TCO2: 27 mmol/L (ref 22–32)
TCO2: 28 mmol/L (ref 22–32)
TCO2: 28 mmol/L (ref 22–32)
TCO2: 27 mmol/L (ref 22–32)
TCO2: 21 mmol/L — ABNORMAL LOW (ref 22–32)

## 2018-01-14 LAB — BASIC METABOLIC PANEL
ANION GAP: 7 (ref 5–15)
BUN: 7 mg/dL (ref 6–20)
CO2: 28 mmol/L (ref 22–32)
Calcium: 9.1 mg/dL (ref 8.9–10.3)
Chloride: 103 mmol/L (ref 98–111)
Creatinine, Ser: 0.92 mg/dL (ref 0.44–1.00)
GLUCOSE: 194 mg/dL — AB (ref 70–99)
POTASSIUM: 4 mmol/L (ref 3.5–5.1)
Sodium: 138 mmol/L (ref 135–145)

## 2018-01-14 LAB — POCT I-STAT 4, (NA,K, GLUC, HGB,HCT)
GLUCOSE: 80 mg/dL (ref 70–99)
GLUCOSE: 166 mg/dL — AB (ref 70–99)
HCT: 36 % (ref 36.0–46.0)
HEMATOCRIT: 36 % (ref 36.0–46.0)
Hemoglobin: 12.2 g/dL (ref 12.0–15.0)
Hemoglobin: 12.2 g/dL (ref 12.0–15.0)
POTASSIUM: 3.6 mmol/L (ref 3.5–5.1)
Potassium: 5 mmol/L (ref 3.5–5.1)
Sodium: 142 mmol/L (ref 135–145)
Sodium: 139 mmol/L (ref 135–145)

## 2018-01-14 LAB — POCT I-STAT 3, ART BLOOD GAS (G3+)
Acid-Base Excess: 2 mmol/L (ref 0.0–2.0)
Acid-base deficit: 5 mmol/L — ABNORMAL HIGH (ref 0.0–2.0)
Acid-base deficit: 5 mmol/L — ABNORMAL HIGH (ref 0.0–2.0)
BICARBONATE: 20 mmol/L (ref 20.0–28.0)
BICARBONATE: 20.2 mmol/L (ref 20.0–28.0)
Bicarbonate: 26.5 mmol/L (ref 20.0–28.0)
Bicarbonate: 25.4 mmol/L (ref 20.0–28.0)
O2 SAT: 95 %
O2 Saturation: 100 %
O2 Saturation: 96 %
O2 Saturation: 92 %
PCO2 ART: 36.4 mmHg (ref 32.0–48.0)
PCO2 ART: 36.7 mmHg (ref 32.0–48.0)
PH ART: 7.431 (ref 7.350–7.450)
PH ART: 7.369 (ref 7.350–7.450)
PH ART: 7.348 — AB (ref 7.350–7.450)
PO2 ART: 391 mmHg — AB (ref 83.0–108.0)
Patient temperature: 37.1
TCO2: 28 mmol/L (ref 22–32)
TCO2: 27 mmol/L (ref 22–32)
TCO2: 21 mmol/L — AB (ref 22–32)
TCO2: 21 mmol/L — ABNORMAL LOW (ref 22–32)
pCO2 arterial: 39.9 mmHg (ref 32.0–48.0)
pCO2 arterial: 44 mmHg (ref 32.0–48.0)
pH, Arterial: 7.348 — ABNORMAL LOW (ref 7.350–7.450)
pO2, Arterial: 87 mmHg (ref 83.0–108.0)
pO2, Arterial: 67 mmHg — ABNORMAL LOW (ref 83.0–108.0)
pO2, Arterial: 82 mmHg — ABNORMAL LOW (ref 83.0–108.0)

## 2018-01-14 LAB — GLUCOSE, CAPILLARY
GLUCOSE-CAPILLARY: 85 mg/dL (ref 70–99)
GLUCOSE-CAPILLARY: 170 mg/dL — AB (ref 70–99)
Glucose-Capillary: 105 mg/dL — ABNORMAL HIGH (ref 70–99)
Glucose-Capillary: 121 mg/dL — ABNORMAL HIGH (ref 70–99)
Glucose-Capillary: 137 mg/dL — ABNORMAL HIGH (ref 70–99)
Glucose-Capillary: 165 mg/dL — ABNORMAL HIGH (ref 70–99)
Glucose-Capillary: 134 mg/dL — ABNORMAL HIGH (ref 70–99)
Glucose-Capillary: 105 mg/dL — ABNORMAL HIGH (ref 70–99)

## 2018-01-14 LAB — CBC
HCT: 39.2 % (ref 36.0–46.0)
HEMATOCRIT: 32.2 % — AB (ref 36.0–46.0)
HEMATOCRIT: 38.5 % (ref 36.0–46.0)
HEMOGLOBIN: 10.1 g/dL — AB (ref 12.0–15.0)
Hemoglobin: 12.7 g/dL (ref 12.0–15.0)
Hemoglobin: 12.7 g/dL (ref 12.0–15.0)
MCH: 27.3 pg (ref 26.0–34.0)
MCH: 28.5 pg (ref 26.0–34.0)
MCH: 28.6 pg (ref 26.0–34.0)
MCHC: 31.4 g/dL (ref 30.0–36.0)
MCHC: 33 g/dL (ref 30.0–36.0)
MCHC: 32.4 g/dL (ref 30.0–36.0)
MCV: 87 fL (ref 78.0–100.0)
MCV: 86.5 fL (ref 78.0–100.0)
MCV: 88.3 fL (ref 78.0–100.0)
Platelets: 282 10*3/uL (ref 150–400)
Platelets: 156 10*3/uL (ref 150–400)
Platelets: 178 10*3/uL (ref 150–400)
RBC: 3.7 MIL/uL — AB (ref 3.87–5.11)
RBC: 4.45 MIL/uL (ref 3.87–5.11)
RBC: 4.44 MIL/uL (ref 3.87–5.11)
RDW: 15.8 % — ABNORMAL HIGH (ref 11.5–15.5)
RDW: 16.6 % — ABNORMAL HIGH (ref 11.5–15.5)
RDW: 16.7 % — ABNORMAL HIGH (ref 11.5–15.5)
WBC: 4.6 10*3/uL (ref 4.0–10.5)
WBC: 10.7 10*3/uL — ABNORMAL HIGH (ref 4.0–10.5)
WBC: 11.4 10*3/uL — ABNORMAL HIGH (ref 4.0–10.5)

## 2018-01-14 LAB — PLATELET COUNT: Platelets: 169 10*3/uL (ref 150–400)

## 2018-01-14 LAB — PREPARE RBC (CROSSMATCH)

## 2018-01-14 LAB — HEPARIN LEVEL (UNFRACTIONATED): HEPARIN UNFRACTIONATED: 0.44 [IU]/mL (ref 0.30–0.70)

## 2018-01-14 LAB — HEMOGLOBIN AND HEMATOCRIT, BLOOD
HCT: 29 % — ABNORMAL LOW (ref 36.0–46.0)
Hemoglobin: 9.6 g/dL — ABNORMAL LOW (ref 12.0–15.0)

## 2018-01-14 LAB — APTT: aPTT: 35 seconds (ref 24–36)

## 2018-01-14 LAB — PROTIME-INR
INR: 1.33
Prothrombin Time: 16.3 seconds — ABNORMAL HIGH (ref 11.4–15.2)

## 2018-01-14 SURGERY — CORONARY ARTERY BYPASS GRAFTING (CABG)
Anesthesia: General | Site: Chest

## 2018-01-14 MED ORDER — ACETAMINOPHEN 500 MG PO TABS
1000.0000 mg | ORAL_TABLET | Freq: Four times a day (QID) | ORAL | Status: AC
  Administered 2018-01-15 – 2018-01-19 (×12): 1000 mg via ORAL
  Filled 2018-01-14 (×12): qty 2

## 2018-01-14 MED ORDER — ACETAMINOPHEN 650 MG RE SUPP
650.0000 mg | Freq: Once | RECTAL | Status: AC
  Administered 2018-01-14: 650 mg via RECTAL

## 2018-01-14 MED ORDER — MILRINONE LACTATE IN DEXTROSE 20-5 MG/100ML-% IV SOLN
0.1250 ug/kg/min | INTRAVENOUS | Status: AC
  Administered 2018-01-14 – 2018-01-15 (×2): 0.3 ug/kg/min via INTRAVENOUS
  Administered 2018-01-16: 0.125 ug/kg/min via INTRAVENOUS
  Filled 2018-01-14 (×2): qty 100

## 2018-01-14 MED ORDER — TRAMADOL HCL 50 MG PO TABS
50.0000 mg | ORAL_TABLET | ORAL | Status: DC | PRN
  Administered 2018-01-18: 50 mg via ORAL
  Administered 2018-01-18 – 2018-01-20 (×3): 100 mg via ORAL
  Filled 2018-01-14: qty 1
  Filled 2018-01-14 (×3): qty 2

## 2018-01-14 MED ORDER — LEVOFLOXACIN IN D5W 750 MG/150ML IV SOLN
750.0000 mg | INTRAVENOUS | Status: AC
  Administered 2018-01-15: 750 mg via INTRAVENOUS
  Filled 2018-01-14: qty 150

## 2018-01-14 MED ORDER — CHLORHEXIDINE GLUCONATE 0.12 % MT SOLN
15.0000 mL | OROMUCOSAL | Status: AC
  Administered 2018-01-14: 15 mL via OROMUCOSAL

## 2018-01-14 MED ORDER — POTASSIUM CHLORIDE 10 MEQ/50ML IV SOLN
10.0000 meq | INTRAVENOUS | Status: AC
  Administered 2018-01-14 (×3): 10 meq via INTRAVENOUS

## 2018-01-14 MED ORDER — SODIUM CHLORIDE 0.9% FLUSH
3.0000 mL | Freq: Two times a day (BID) | INTRAVENOUS | Status: DC
  Administered 2018-01-15: 3 mL via INTRAVENOUS

## 2018-01-14 MED ORDER — VANCOMYCIN HCL IN DEXTROSE 1-5 GM/200ML-% IV SOLN
1000.0000 mg | Freq: Once | INTRAVENOUS | Status: AC
  Administered 2018-01-14: 1000 mg via INTRAVENOUS
  Filled 2018-01-14: qty 200

## 2018-01-14 MED ORDER — 0.9 % SODIUM CHLORIDE (POUR BTL) OPTIME
TOPICAL | Status: DC | PRN
  Administered 2018-01-14: 4000 mL

## 2018-01-14 MED ORDER — HEPARIN SODIUM (PORCINE) 1000 UNIT/ML IJ SOLN
INTRAMUSCULAR | Status: AC
  Filled 2018-01-14: qty 1

## 2018-01-14 MED ORDER — SODIUM CHLORIDE 0.9 % IV SOLN
INTRAVENOUS | Status: DC | PRN
  Administered 2018-01-14: 13:00:00 via INTRAVENOUS

## 2018-01-14 MED ORDER — HEPARIN SODIUM (PORCINE) 1000 UNIT/ML IJ SOLN
INTRAMUSCULAR | Status: DC | PRN
  Administered 2018-01-14: 22000 [IU] via INTRAVENOUS

## 2018-01-14 MED ORDER — MIDAZOLAM HCL 2 MG/2ML IJ SOLN
2.0000 mg | INTRAMUSCULAR | Status: DC | PRN
  Filled 2018-01-14: qty 2

## 2018-01-14 MED ORDER — ONDANSETRON HCL 4 MG/2ML IJ SOLN
4.0000 mg | Freq: Four times a day (QID) | INTRAMUSCULAR | Status: DC | PRN
  Administered 2018-01-15 – 2018-01-19 (×9): 4 mg via INTRAVENOUS
  Filled 2018-01-14 (×9): qty 2

## 2018-01-14 MED ORDER — PROPOFOL 10 MG/ML IV BOLUS
INTRAVENOUS | Status: AC
  Filled 2018-01-14: qty 20

## 2018-01-14 MED ORDER — SODIUM CHLORIDE 0.9% FLUSH
10.0000 mL | Freq: Two times a day (BID) | INTRAVENOUS | Status: DC
  Administered 2018-01-14 – 2018-01-24 (×18): 10 mL

## 2018-01-14 MED ORDER — FAMOTIDINE IN NACL 20-0.9 MG/50ML-% IV SOLN
20.0000 mg | Freq: Two times a day (BID) | INTRAVENOUS | Status: AC
  Administered 2018-01-14 (×2): 20 mg via INTRAVENOUS
  Filled 2018-01-14: qty 50

## 2018-01-14 MED ORDER — MORPHINE SULFATE (PF) 2 MG/ML IV SOLN
2.0000 mg | INTRAVENOUS | Status: DC | PRN
  Administered 2018-01-14 – 2018-01-15 (×3): 2 mg via INTRAVENOUS
  Administered 2018-01-15: 4 mg via INTRAVENOUS
  Administered 2018-01-15 – 2018-01-16 (×3): 2 mg via INTRAVENOUS
  Administered 2018-01-16: 4 mg via INTRAVENOUS
  Administered 2018-01-16: 2 mg via INTRAVENOUS
  Filled 2018-01-14: qty 1
  Filled 2018-01-14: qty 2
  Filled 2018-01-14 (×2): qty 1
  Filled 2018-01-14: qty 2
  Filled 2018-01-14 (×3): qty 1

## 2018-01-14 MED ORDER — FENTANYL CITRATE (PF) 250 MCG/5ML IJ SOLN
INTRAMUSCULAR | Status: DC | PRN
  Administered 2018-01-14 (×2): 100 ug via INTRAVENOUS
  Administered 2018-01-14: 250 ug via INTRAVENOUS
  Administered 2018-01-14: 50 ug via INTRAVENOUS
  Administered 2018-01-14: 150 ug via INTRAVENOUS
  Administered 2018-01-14: 400 ug via INTRAVENOUS
  Administered 2018-01-14: 150 ug via INTRAVENOUS
  Administered 2018-01-14: 50 ug via INTRAVENOUS

## 2018-01-14 MED ORDER — PANTOPRAZOLE SODIUM 40 MG PO TBEC
40.0000 mg | DELAYED_RELEASE_TABLET | Freq: Every day | ORAL | Status: DC
  Administered 2018-01-17 – 2018-01-19 (×3): 40 mg via ORAL
  Filled 2018-01-14 (×3): qty 1

## 2018-01-14 MED ORDER — ALBUMIN HUMAN 5 % IV SOLN
250.0000 mL | INTRAVENOUS | Status: AC | PRN
  Administered 2018-01-14 – 2018-01-15 (×4): 250 mL via INTRAVENOUS
  Filled 2018-01-14 (×2): qty 250

## 2018-01-14 MED ORDER — SODIUM CHLORIDE 0.9% FLUSH: 3.0000 mL | INTRAVENOUS | Status: DC | PRN

## 2018-01-14 MED ORDER — SODIUM CHLORIDE 0.9% FLUSH: 10.0000 mL | INTRAVENOUS | Status: DC | PRN

## 2018-01-14 MED ORDER — SODIUM CHLORIDE 0.9 % IV SOLN
INTRAVENOUS | Status: DC | PRN
  Administered 2018-01-14: 25 ug/min via INTRAVENOUS
  Administered 2018-01-14: 20 ug/min via INTRAVENOUS

## 2018-01-14 MED ORDER — PROPOFOL 10 MG/ML IV BOLUS
INTRAVENOUS | Status: DC | PRN
  Administered 2018-01-14: 30 mg via INTRAVENOUS
  Administered 2018-01-14: 100 mg via INTRAVENOUS

## 2018-01-14 MED ORDER — PLASMA-LYTE 148 IV SOLN
INTRAVENOUS | Status: DC | PRN
  Administered 2018-01-14: 500 mL via INTRAVASCULAR

## 2018-01-14 MED ORDER — CHLORHEXIDINE GLUCONATE CLOTH 2 % EX PADS
6.0000 | MEDICATED_PAD | Freq: Every day | CUTANEOUS | Status: DC
  Administered 2018-01-14 – 2018-01-25 (×10): 6 via TOPICAL

## 2018-01-14 MED ORDER — ASPIRIN 81 MG PO CHEW: 324.0000 mg | CHEWABLE_TABLET | Freq: Every day | ORAL | Status: DC

## 2018-01-14 MED ORDER — ASPIRIN EC 325 MG PO TBEC
325.0000 mg | DELAYED_RELEASE_TABLET | Freq: Every day | ORAL | Status: DC
  Administered 2018-01-17 – 2018-01-19 (×3): 325 mg via ORAL
  Filled 2018-01-14 (×4): qty 1

## 2018-01-14 MED ORDER — INSULIN REGULAR BOLUS VIA INFUSION
0.0000 [IU] | Freq: Three times a day (TID) | INTRAVENOUS | Status: DC
  Filled 2018-01-14: qty 10

## 2018-01-14 MED ORDER — ACETAMINOPHEN 160 MG/5ML PO SOLN: 1000.0000 mg | Freq: Four times a day (QID) | ORAL | Status: AC

## 2018-01-14 MED ORDER — HEMOSTATIC AGENTS (NO CHARGE) OPTIME
TOPICAL | Status: DC | PRN
  Administered 2018-01-14 (×3): 1 via TOPICAL

## 2018-01-14 MED ORDER — SODIUM CHLORIDE 0.9 % IV SOLN: 30.0000 ug/min | INTRAVENOUS | Status: DC

## 2018-01-14 MED ORDER — ACETAMINOPHEN 160 MG/5ML PO SOLN: 650.0000 mg | Freq: Once | ORAL | Status: AC

## 2018-01-14 MED ORDER — OXYCODONE HCL 5 MG PO TABS: 5.0000 mg | ORAL_TABLET | ORAL | Status: DC | PRN

## 2018-01-14 MED ORDER — ROCURONIUM BROMIDE 10 MG/ML (PF) SYRINGE
PREFILLED_SYRINGE | INTRAVENOUS | Status: DC | PRN
  Administered 2018-01-14 (×2): 50 mg via INTRAVENOUS
  Administered 2018-01-14: 100 mg via INTRAVENOUS
  Administered 2018-01-14: 50 mg via INTRAVENOUS

## 2018-01-14 MED ORDER — ROCURONIUM BROMIDE 10 MG/ML (PF) SYRINGE
PREFILLED_SYRINGE | INTRAVENOUS | Status: AC
  Filled 2018-01-14: qty 10

## 2018-01-14 MED ORDER — ORAL CARE MOUTH RINSE
15.0000 mL | Freq: Two times a day (BID) | OROMUCOSAL | Status: DC
  Administered 2018-01-14 – 2018-01-23 (×8): 15 mL via OROMUCOSAL

## 2018-01-14 MED ORDER — PHENYLEPHRINE HCL-NACL 20-0.9 MG/250ML-% IV SOLN
0.0000 ug/min | INTRAVENOUS | Status: DC
  Administered 2018-01-15: 30 ug/min via INTRAVENOUS
  Filled 2018-01-14: qty 250

## 2018-01-14 MED ORDER — PROTAMINE SULFATE 10 MG/ML IV SOLN
INTRAVENOUS | Status: DC | PRN
  Administered 2018-01-14: 220 mg via INTRAVENOUS

## 2018-01-14 MED ORDER — LACTATED RINGERS IV SOLN
INTRAVENOUS | Status: DC
  Administered 2018-01-14: 17:00:00 via INTRAVENOUS

## 2018-01-14 MED ORDER — SODIUM CHLORIDE 0.9 % IV SOLN: 250.0000 mL | INTRAVENOUS | Status: DC

## 2018-01-14 MED ORDER — SODIUM CHLORIDE 0.9 % IJ SOLN
INTRAMUSCULAR | Status: AC
  Filled 2018-01-14: qty 10

## 2018-01-14 MED ORDER — BISACODYL 10 MG RE SUPP
10.0000 mg | Freq: Every day | RECTAL | Status: DC
  Filled 2018-01-14: qty 1

## 2018-01-14 MED ORDER — BISACODYL 5 MG PO TBEC
10.0000 mg | DELAYED_RELEASE_TABLET | Freq: Every day | ORAL | Status: DC
  Administered 2018-01-17: 10 mg via ORAL
  Filled 2018-01-14: qty 2

## 2018-01-14 MED ORDER — DOCUSATE SODIUM 100 MG PO CAPS
200.0000 mg | ORAL_CAPSULE | Freq: Every day | ORAL | Status: DC
  Administered 2018-01-17: 200 mg via ORAL
  Filled 2018-01-14: qty 2

## 2018-01-14 MED ORDER — LACTATED RINGERS IV SOLN
INTRAVENOUS | Status: DC | PRN
  Administered 2018-01-14: 07:00:00 via INTRAVENOUS

## 2018-01-14 MED ORDER — NITROGLYCERIN IN D5W 200-5 MCG/ML-% IV SOLN: 0.0000 ug/min | INTRAVENOUS | Status: DC

## 2018-01-14 MED ORDER — METOPROLOL TARTRATE 25 MG/10 ML ORAL SUSPENSION: 12.5000 mg | Freq: Two times a day (BID) | ORAL | Status: DC

## 2018-01-14 MED ORDER — MAGNESIUM SULFATE 4 GM/100ML IV SOLN
4.0000 g | Freq: Once | INTRAVENOUS | Status: AC
  Administered 2018-01-14: 4 g via INTRAVENOUS
  Filled 2018-01-14: qty 100

## 2018-01-14 MED ORDER — FENTANYL CITRATE (PF) 250 MCG/5ML IJ SOLN
INTRAMUSCULAR | Status: AC
  Filled 2018-01-14: qty 25

## 2018-01-14 MED ORDER — CHLORHEXIDINE GLUCONATE 0.12% ORAL RINSE (MEDLINE KIT)
15.0000 mL | Freq: Two times a day (BID) | OROMUCOSAL | Status: DC
  Administered 2018-01-14: 15 mL via OROMUCOSAL

## 2018-01-14 MED ORDER — ORAL CARE MOUTH RINSE
15.0000 mL | OROMUCOSAL | Status: DC
  Administered 2018-01-14 (×2): 15 mL via OROMUCOSAL

## 2018-01-14 MED ORDER — MIDAZOLAM HCL 5 MG/5ML IJ SOLN
INTRAMUSCULAR | Status: DC | PRN
  Administered 2018-01-14: 2 mg via INTRAVENOUS
  Administered 2018-01-14: 3 mg via INTRAVENOUS
  Administered 2018-01-14: 2 mg via INTRAVENOUS
  Administered 2018-01-14: 3 mg via INTRAVENOUS

## 2018-01-14 MED ORDER — LACTATED RINGERS IV SOLN
INTRAVENOUS | Status: DC
  Administered 2018-01-14: 15:00:00 via INTRAVENOUS

## 2018-01-14 MED ORDER — MIDAZOLAM HCL 10 MG/2ML IJ SOLN
INTRAMUSCULAR | Status: AC
  Filled 2018-01-14: qty 2

## 2018-01-14 MED ORDER — MORPHINE SULFATE (PF) 2 MG/ML IV SOLN
1.0000 mg | INTRAVENOUS | Status: AC | PRN
  Administered 2018-01-14 (×3): 2 mg via INTRAVENOUS
  Filled 2018-01-14 (×4): qty 1

## 2018-01-14 MED ORDER — METOPROLOL TARTRATE 5 MG/5ML IV SOLN
2.5000 mg | INTRAVENOUS | Status: DC | PRN
  Administered 2018-01-16: 5 mg via INTRAVENOUS
  Filled 2018-01-14: qty 5

## 2018-01-14 MED ORDER — SODIUM CHLORIDE 0.9 % IV SOLN
INTRAVENOUS | Status: DC
  Filled 2018-01-14: qty 1

## 2018-01-14 MED ORDER — LACTATED RINGERS IV SOLN: 500.0000 mL | Freq: Once | INTRAVENOUS | Status: DC | PRN

## 2018-01-14 MED ORDER — PHENYLEPHRINE HCL-NACL 20-0.9 MG/250ML-% IV SOLN
0.0000 ug/min | INTRAVENOUS | Status: DC
  Filled 2018-01-14: qty 250

## 2018-01-14 MED ORDER — SODIUM CHLORIDE 0.9 % IV SOLN
INTRAVENOUS | Status: DC
  Administered 2018-01-14: 15:00:00 via INTRAVENOUS
  Administered 2018-01-18: 20 mL/h via INTRAVENOUS

## 2018-01-14 MED ORDER — DEXMEDETOMIDINE HCL IN NACL 200 MCG/50ML IV SOLN
0.0000 ug/kg/h | INTRAVENOUS | Status: DC
  Administered 2018-01-14: 0.4 ug/kg/h via INTRAVENOUS
  Filled 2018-01-14: qty 50

## 2018-01-14 MED ORDER — ALBUMIN HUMAN 5 % IV SOLN
INTRAVENOUS | Status: DC | PRN
  Administered 2018-01-14: 13:00:00 via INTRAVENOUS

## 2018-01-14 MED ORDER — SODIUM CHLORIDE 0.45 % IV SOLN: INTRAVENOUS | Status: DC | PRN

## 2018-01-14 MED ORDER — METOPROLOL TARTRATE 12.5 MG HALF TABLET
12.5000 mg | ORAL_TABLET | Freq: Two times a day (BID) | ORAL | Status: DC
  Administered 2018-01-16 – 2018-01-19 (×8): 12.5 mg via ORAL
  Filled 2018-01-14 (×9): qty 1

## 2018-01-14 SURGICAL SUPPLY — 76 items
ADH SKN CLS APL DERMABOND .7 (GAUZE/BANDAGES/DRESSINGS) ×4
APL SWBSTK 6 STRL LF DISP (MISCELLANEOUS) ×2
APPLICATOR COTTON TIP 6 STRL (MISCELLANEOUS) IMPLANT
APPLICATOR COTTON TIP 6IN STRL (MISCELLANEOUS) ×3
BAG DECANTER FOR FLEXI CONT (MISCELLANEOUS) ×3 IMPLANT
BANDAGE ACE 4X5 VEL STRL LF (GAUZE/BANDAGES/DRESSINGS) ×4 IMPLANT
BANDAGE ACE 6X5 VEL STRL LF (GAUZE/BANDAGES/DRESSINGS) ×4 IMPLANT
BLADE STERNUM SYSTEM 6 (BLADE) ×3 IMPLANT
BLADE SURG 11 STRL SS (BLADE) ×1 IMPLANT
BNDG GAUZE ELAST 4 BULKY (GAUZE/BANDAGES/DRESSINGS) ×4 IMPLANT
CANISTER SUCT 3000ML PPV (MISCELLANEOUS) ×3 IMPLANT
CANNULA VEN 2 STAGE (MISCELLANEOUS) ×1 IMPLANT
CATH CPB KIT GERHARDT (MISCELLANEOUS) ×3 IMPLANT
CATH THORACIC 28FR (CATHETERS) ×3 IMPLANT
CRADLE DONUT ADULT HEAD (MISCELLANEOUS) ×3 IMPLANT
DERMABOND ADVANCED (GAUZE/BANDAGES/DRESSINGS) ×2
DERMABOND ADVANCED .7 DNX12 (GAUZE/BANDAGES/DRESSINGS) IMPLANT
DRAIN CHANNEL 28F RND 3/8 FF (WOUND CARE) ×3 IMPLANT
DRAPE CARDIOVASCULAR INCISE (DRAPES) ×3
DRAPE SLUSH/WARMER DISC (DRAPES) ×3 IMPLANT
DRAPE SRG 135X102X78XABS (DRAPES) ×2 IMPLANT
DRSG AQUACEL AG ADV 3.5X14 (GAUZE/BANDAGES/DRESSINGS) ×3 IMPLANT
ELECT BLADE 4.0 EZ CLEAN MEGAD (MISCELLANEOUS) ×3
ELECT REM PT RETURN 9FT ADLT (ELECTROSURGICAL) ×6
ELECTRODE BLDE 4.0 EZ CLN MEGD (MISCELLANEOUS) ×2 IMPLANT
ELECTRODE REM PT RTRN 9FT ADLT (ELECTROSURGICAL) ×4 IMPLANT
FELT TEFLON 1X6 (MISCELLANEOUS) ×6 IMPLANT
FLOSEAL 5ML (HEMOSTASIS) ×1 IMPLANT
GAUZE SPONGE 4X4 12PLY STRL (GAUZE/BANDAGES/DRESSINGS) ×5 IMPLANT
GLOVE BIO SURGEON STRL SZ 6 (GLOVE) ×1 IMPLANT
GLOVE BIO SURGEON STRL SZ 6.5 (GLOVE) ×9 IMPLANT
GOWN STRL REUS W/ TWL LRG LVL3 (GOWN DISPOSABLE) ×8 IMPLANT
GOWN STRL REUS W/TWL LRG LVL3 (GOWN DISPOSABLE) ×15
HEMOSTAT POWDER SURGIFOAM 1G (HEMOSTASIS) ×9 IMPLANT
HEMOSTAT SURGICEL 2X14 (HEMOSTASIS) ×4 IMPLANT
KIT BASIN OR (CUSTOM PROCEDURE TRAY) ×3 IMPLANT
KIT CATH SUCT 8FR (CATHETERS) ×4 IMPLANT
KIT SUCTION CATH 14FR (SUCTIONS) ×6 IMPLANT
KIT TURNOVER KIT B (KITS) ×3 IMPLANT
KIT VASOVIEW HEMOPRO VH 3000 (KITS) ×3 IMPLANT
LEAD PACING MYOCARDI (MISCELLANEOUS) ×3 IMPLANT
MARKER GRAFT CORONARY BYPASS (MISCELLANEOUS) ×9 IMPLANT
NS IRRIG 1000ML POUR BTL (IV SOLUTION) ×15 IMPLANT
PACK E OPEN HEART (SUTURE) ×3 IMPLANT
PACK OPEN HEART (CUSTOM PROCEDURE TRAY) ×3 IMPLANT
PAD ARMBOARD 7.5X6 YLW CONV (MISCELLANEOUS) ×6 IMPLANT
PAD ELECT DEFIB RADIOL ZOLL (MISCELLANEOUS) ×3 IMPLANT
PENCIL BUTTON HOLSTER BLD 10FT (ELECTRODE) ×3 IMPLANT
POWDER SURGICEL 3.0 GRAM (HEMOSTASIS) ×1 IMPLANT
PUNCH AORTIC ROT 4.0MM RCL 40 (MISCELLANEOUS) ×1 IMPLANT
SET CARDIOPLEGIA MPS 5001102 (MISCELLANEOUS) ×1 IMPLANT
SOLUTION ANTI FOG 6CC (MISCELLANEOUS) ×1 IMPLANT
SPONGE LAP 18X18 X RAY DECT (DISPOSABLE) ×2 IMPLANT
SPONGE LAP 4X18 RFD (DISPOSABLE) ×1 IMPLANT
SUT BONE WAX W31G (SUTURE) ×3 IMPLANT
SUT ETHILON 3 0 PS 1 (SUTURE) ×1 IMPLANT
SUT MNCRL AB 4-0 PS2 18 (SUTURE) ×2 IMPLANT
SUT PROLENE 3 0 SH1 36 (SUTURE) ×3 IMPLANT
SUT PROLENE 4 0 TF (SUTURE) ×6 IMPLANT
SUT PROLENE 6 0 C 1 30 (SUTURE) ×2 IMPLANT
SUT PROLENE 6 0 CC (SUTURE) ×7 IMPLANT
SUT PROLENE 7 0 BV 1 (SUTURE) ×5 IMPLANT
SUT PROLENE 7 0 BV1 MDA (SUTURE) ×4 IMPLANT
SUT PROLENE 8 0 BV175 6 (SUTURE) ×4 IMPLANT
SUT STEEL 6MS V (SUTURE) ×4 IMPLANT
SUT STEEL SZ 6 DBL 3X14 BALL (SUTURE) ×3 IMPLANT
SUT VIC AB 1 CTX 18 (SUTURE) ×6 IMPLANT
SUT VIC AB 2-0 CT1 27 (SUTURE) ×6
SUT VIC AB 2-0 CT1 TAPERPNT 27 (SUTURE) IMPLANT
SYSTEM SAHARA CHEST DRAIN ATS (WOUND CARE) ×3 IMPLANT
TOWEL GREEN STERILE (TOWEL DISPOSABLE) ×3 IMPLANT
TOWEL GREEN STERILE FF (TOWEL DISPOSABLE) ×3 IMPLANT
TRAY FOLEY SLVR 16FR TEMP STAT (SET/KITS/TRAYS/PACK) ×3 IMPLANT
TUBING INSUFFLATION (TUBING) ×3 IMPLANT
UNDERPAD 30X30 (UNDERPADS AND DIAPERS) ×3 IMPLANT
WATER STERILE IRR 1000ML POUR (IV SOLUTION) ×6 IMPLANT

## 2018-01-14 NOTE — Brief Op Note (Signed)
      [address]       [place], [address]             [phone]       01/14/2018  3:16 PM  PATIENT:  [name]  52 y.o. female  PRE-OPERATIVE DIAGNOSIS:  CAD  POST-OPERATIVE DIAGNOSIS:  CAD  PROCEDURE:  Procedure(s):  CORONARY ARTERY BYPASS GRAFTING x 3 -LIMA to LAD -SVG to cx -SVG to PDA  ENDOSCOPIC HARVEST GREATER SAPHENOUS VEIN -Right Leg and Left Thigh  TRANSESOPHAGEAL ECHOCARDIOGRAM (TEE) (N/A)  SURGEON:  Surgeon(s) and Role:    * Grace Isaac, MD - Primary  PHYSICIAN ASSISTANT: Ellwood Handler PA-C  ANESTHESIA:   general  EBL:  800 mL   BLOOD ADMINISTERED: CELLSAVER, 2U PRBC  DRAINS: Left Pleural Chest Tubes, Mediastinal Chest Drains   LOCAL MEDICATIONS USED:  NONE  SPECIMEN:  No Specimen  DISPOSITION OF SPECIMEN:  N/A  COUNTS:  YES   DICTATION: .Dragon Dictation  PLAN OF CARE: Admit to inpatient   PATIENT DISPOSITION:  ICU - intubated and hemodynamically stable.   Delay start of Pharmacological VTE agent (>24hrs) due to surgical blood loss or risk of bleeding: yes

## 2018-01-14 NOTE — Progress Notes (Signed)
  Echocardiogram Echocardiogram Transesophageal has been performed.  Matilde Bash 01/14/2018, 8:21 AM

## 2018-01-14 NOTE — Anesthesia Procedure Notes (Signed)
Procedure Name: Intubation Date/Time: 01/14/2018 7:47 AM Performed by: Kyung Rudd, CRNA Pre-anesthesia Checklist: Patient identified, Emergency Drugs available, Suction available and Patient being monitored Patient Re-evaluated:Patient Re-evaluated prior to induction Oxygen Delivery Method: Circle system utilized Preoxygenation: Pre-oxygenation with 100% oxygen Induction Type: IV induction Ventilation: Mask ventilation without difficulty and Oral airway inserted - appropriate to patient size Laryngoscope Size: Mac and 3 Grade View: Grade II Tube type: Oral Tube size: 7.5 mm Number of attempts: 1 Airway Equipment and Method: Stylet Placement Confirmation: ETT inserted through vocal cords under direct vision,  positive ETCO2 and breath sounds checked- equal and bilateral Secured at: 21 cm Tube secured with: Tape Dental Injury: Teeth and Oropharynx as per pre-operative assessment

## 2018-01-14 NOTE — Plan of Care (Signed)
Patient has been undergoing CABG this morning. CTVS service will assume care after CABG. Please call if needed, I will sign off.   Estill Cotta M.D. [facility] 01/14/2018, 1:25 PM  Pager: [phone]

## 2018-01-14 NOTE — Progress Notes (Signed)
Pt extubated at 21:02 to 6L . Verbalized name and coughed on command. Performed 500 on IS.  Will continue to monitor.

## 2018-01-14 NOTE — Progress Notes (Signed)
Patient ID: Mette Southgate, female   DOB: 1965-12-06, 52 y.o.   MRN: [id number] EVENING ROUNDS NOTE :     [place].[address]       ,[place] [address]             [phone]                 Day of Surgery Procedure(s) (LRB): CORONARY ARTERY BYPASS GRAFTING (CABG)X3, RIGHT AND LEFT SAPHENOUS VEIN HARVEST, MAMMARY TAKE DOWN. MAMMARY TO LAD, SVG TO PD, SVG TO DISTAL CIRC. (N/A) TRANSESOPHAGEAL ECHOCARDIOGRAM (TEE) (N/A)  Total Length of Stay:  LOS: 10 days  BP (!) 96/58   Pulse 74   Temp 98.2 F (36.8 C)   Resp 20   Ht 5\' 1"  (1.549 m)   Wt 148 lb 8 oz (67.4 kg)   LMP  (LMP Unknown)   SpO2 98%   BMI 28.06 kg/m   .Intake/Output      07/22 0701 - 07/23 0700 07/23 0701 - 07/24 0700   P.O. 960    I.V. (mL/kg) 264.9 (3.9) 2300.8 (34.1)   Blood  780   IV Piggyback  712.1   Total Intake(mL/kg) 1224.9 (18.2) 3792.9 (56.3)   Urine (mL/kg/hr) 1175 (0.7) 2185 (2.8)   Emesis/NG output  100   Stool     Blood  800   Chest Tube  124   Total Output 1175 3209   Net +49.9 +583.9        Urine Occurrence 2 x    Stool Occurrence 1 x      . sodium chloride 20 mL/hr at 01/14/18 1500  . [START ON 01/15/2018] sodium chloride    . sodium chloride 10 mL/hr at 01/14/18 1453  . albumin human 250 mL (01/14/18 1721)  . dexmedetomidine (PRECEDEX) IV infusion Stopped (01/14/18 1804)  . famotidine (PEPCID) IV 20 mg (01/14/18 1452)  . insulin (NOVOLIN-R) infusion Stopped (01/14/18 1600)  . lactated ringers    . lactated ringers 10 mL/hr at 01/14/18 1456  . lactated ringers 10 mL/hr at 01/14/18 1800  . [START ON 01/15/2018] levofloxacin (LEVAQUIN) IV    . magnesium sulfate 20 mL/hr at 01/14/18 1800  . milrinone 0.3 mcg/kg/min (01/14/18 1800)  . nitroGLYCERIN Stopped (01/14/18 1448)  . phenylephrine (NEO-SYNEPHRINE) Adult infusion 6 mcg/min (01/14/18 1800)  . potassium chloride 10 mEq (01/14/18 1800)  . vancomycin       Lab Results  Component Value Date   WBC 10.7 (H) 01/14/2018   HGB 12.2 01/14/2018   HCT 36.0 01/14/2018   PLT 156 01/14/2018   GLUCOSE 80 01/14/2018   CHOL 121 01/07/2018   TRIG 101 01/07/2018   HDL 47 01/07/2018   LDLCALC 54 01/07/2018   ALT 22 01/07/2018   AST 29 01/07/2018   NA 142 01/14/2018   K 3.6 01/14/2018   CL 102 01/14/2018   CREATININE 0.80 01/14/2018   BUN 6 01/14/2018   CO2 28 01/14/2018   TSH 0.632 03/25/2017   INR 1.33 01/14/2018   HGBA1C 8.6 (H) 01/08/2018   MICROALBUR 24.6 09/04/2016   Stable postop  ci 2.2 Not bleeding Still on vent sleepy   Grace Isaac MD  Beeper [phone] Office [phone] 01/14/2018 6:32 PM

## 2018-01-14 NOTE — Progress Notes (Signed)
Patient arrived to OR alert and oriented. Patient able to confirm name, DOB, procedure, allergies, no metal in body and no pain. Patient able to move over to OR table with minimal assistance.   Leatha Gilding, RN

## 2018-01-14 NOTE — Anesthesia Procedure Notes (Signed)
Arterial Line Insertion Start/End7/23/2019 6:55 AM, 01/14/2018 7:05 AM Performed by: Kyung Rudd, CRNA, CRNA  Patient location: Pre-op. Preanesthetic checklist: patient identified, IV checked, site marked, risks and benefits discussed, surgical consent, monitors and equipment checked, pre-op evaluation and timeout performed Patient sedated Right, radial was placed Catheter size: 20 G Hand hygiene performed , maximum sterile barriers used  and Seldinger technique used Allen's test indicative of satisfactory collateral circulation Attempts: 1 Procedure performed without using ultrasound guided technique. Following insertion, Biopatch and dressing applied. Post procedure assessment: normal  Patient tolerated the procedure well with no immediate complications.

## 2018-01-14 NOTE — Progress Notes (Signed)
      [address]       [place] [address]             [phone]     Pre Procedure note for inpatients:   Damira Kem has been scheduled for Procedure(s): CORONARY ARTERY BYPASS GRAFTING (CABG) (N/A) TRANSESOPHAGEAL ECHOCARDIOGRAM (TEE) (N/A) today. The various methods of treatment have been discussed with the patient. After consideration of the risks, benefits and treatment options the patient has consented to the planned procedure.   The patient has been seen and labs reviewed. There are no changes in the patient's condition to prevent proceeding with the planned procedure today.  Recent labs:  Lab Results  Component Value Date   WBC 4.6 01/14/2018   HGB 10.1 (L) 01/14/2018   HCT 32.2 (L) 01/14/2018   PLT 282 01/14/2018   GLUCOSE 194 (H) 01/14/2018   CHOL 121 01/07/2018   TRIG 101 01/07/2018   HDL 47 01/07/2018   LDLCALC 54 01/07/2018   ALT 22 01/07/2018   AST 29 01/07/2018   NA 138 01/14/2018   K 4.0 01/14/2018   CL 103 01/14/2018   CREATININE 0.92 01/14/2018   BUN 7 01/14/2018   CO2 28 01/14/2018   TSH 0.632 03/25/2017   INR 0.98 01/13/2018   HGBA1C 8.6 (H) 01/08/2018   MICROALBUR 24.6 09/04/2016    Grace Isaac, MD 01/14/2018 7:11 AM

## 2018-01-14 NOTE — Transfer of Care (Signed)
Immediate Anesthesia Transfer of Care Note  Patient: [name]  Procedure(s) Performed: CORONARY ARTERY BYPASS GRAFTING (CABG)X3, RIGHT AND LEFT SAPHENOUS VEIN HARVEST, MAMMARY TAKE DOWN. MAMMARY TO LAD, SVG TO PD, SVG TO DISTAL CIRC. (N/A Chest) TRANSESOPHAGEAL ECHOCARDIOGRAM (TEE) (N/A )  Patient Location: SICU  Anesthesia Type:General  Level of Consciousness: sedated and Patient remains intubated per anesthesia plan  Airway & Oxygen Therapy: Patient remains intubated per anesthesia plan and Patient placed on Ventilator (see vital sign flow sheet for setting)  Post-op Assessment: Report given to RN and Post -op Vital signs reviewed and stable  Post vital signs: Reviewed and stable  Last Vitals:  Vitals Value Taken Time  BP 130/76 01/14/2018  2:32 PM  Temp 37.1 C 01/14/2018  2:37 PM  Pulse 82 01/14/2018  2:37 PM  Resp 15 01/14/2018  2:37 PM  SpO2 96 % 01/14/2018  2:37 PM  Vitals shown include unvalidated device data.  Last Pain:  Vitals:   01/14/18 0500  TempSrc: Oral  PainSc:       Patients Stated Pain Goal: 3 (09/32/67 1245)  Complications: No apparent anesthesia complications

## 2018-01-14 NOTE — Procedures (Signed)
Extubation Procedure Note  Patient Details:   Name: [name] DOB: 03-12-1966 MRN: [id number]   Airway Documentation:   Vent end date: 01/14/2018  Vent end time: 2102   Evaluation  O2 sats: stable throughout  Complications: No apparent complications Patient did tolerate procedure well. Bilateral breath sounds: Clear, diminished    Yes    NIF -20, VC 1.6L, positive cuff leak noted. Patient placed on 6L with humidity. No stridor noted.   Karl Pock 01/14/2018, 9:13 PM

## 2018-01-14 NOTE — Anesthesia Procedure Notes (Signed)
Central Venous Catheter Insertion Performed by: Roberts Gaudy, MD, anesthesiologist Start/End7/23/2019 6:30 AM, 01/14/2018 6:40 AM Patient location: Pre-op. Preanesthetic checklist: patient identified, IV checked, site marked, risks and benefits discussed, surgical consent, monitors and equipment checked, pre-op evaluation, timeout performed and anesthesia consent Lidocaine 1% used for infiltration and patient sedated Hand hygiene performed  and maximum sterile barriers used  Catheter size: 8.5 Fr Sheath introducer Procedure performed using ultrasound guided technique. Ultrasound Notes:anatomy identified, needle tip was noted to be adjacent to the nerve/plexus identified, no ultrasound evidence of intravascular and/or intraneural injection and image(s) printed for medical record Attempts: 1 Following insertion, line sutured and dressing applied. Post procedure assessment: blood return through all ports, free fluid flow and no air  Patient tolerated the procedure well with no immediate complications.

## 2018-01-14 NOTE — Anesthesia Procedure Notes (Signed)
Central Venous Catheter Insertion Performed by: Roberts Gaudy, MD, anesthesiologist Start/End7/23/2019 6:30 AM, 01/14/2018 6:40 AM Patient location: Pre-op. Preanesthetic checklist: patient identified, IV checked, site marked, risks and benefits discussed, surgical consent, monitors and equipment checked, pre-op evaluation, timeout performed and anesthesia consent Hand hygiene performed  and maximum sterile barriers used  PA cath was placed.Swan type:thermodilution Procedure performed without using ultrasound guided technique. Attempts: 1 Patient tolerated the procedure well with no immediate complications.

## 2018-01-14 NOTE — Progress Notes (Signed)
RT Note: Cardiac Wean initiated.

## 2018-01-15 ENCOUNTER — Inpatient Hospital Stay (HOSPITAL_COMMUNITY): Payer: Medicare Other

## 2018-01-15 ENCOUNTER — Encounter (HOSPITAL_COMMUNITY): Payer: Self-pay | Admitting: Cardiothoracic Surgery

## 2018-01-15 LAB — GLUCOSE, CAPILLARY
GLUCOSE-CAPILLARY: 105 mg/dL — AB (ref 70–99)
GLUCOSE-CAPILLARY: 110 mg/dL — AB (ref 70–99)
GLUCOSE-CAPILLARY: 104 mg/dL — AB (ref 70–99)
GLUCOSE-CAPILLARY: 118 mg/dL — AB (ref 70–99)
GLUCOSE-CAPILLARY: 106 mg/dL — AB (ref 70–99)
GLUCOSE-CAPILLARY: 110 mg/dL — AB (ref 70–99)
Glucose-Capillary: 106 mg/dL — ABNORMAL HIGH (ref 70–99)
Glucose-Capillary: 110 mg/dL — ABNORMAL HIGH (ref 70–99)
Glucose-Capillary: 115 mg/dL — ABNORMAL HIGH (ref 70–99)
Glucose-Capillary: 116 mg/dL — ABNORMAL HIGH (ref 70–99)
Glucose-Capillary: 119 mg/dL — ABNORMAL HIGH (ref 70–99)
Glucose-Capillary: 122 mg/dL — ABNORMAL HIGH (ref 70–99)
Glucose-Capillary: 127 mg/dL — ABNORMAL HIGH (ref 70–99)
Glucose-Capillary: 150 mg/dL — ABNORMAL HIGH (ref 70–99)
Glucose-Capillary: 175 mg/dL — ABNORMAL HIGH (ref 70–99)
Glucose-Capillary: 192 mg/dL — ABNORMAL HIGH (ref 70–99)

## 2018-01-15 LAB — CREATININE, SERUM
CREATININE: 1.24 mg/dL — AB (ref 0.44–1.00)
GFR calc Af Amer: 58 mL/min — ABNORMAL LOW (ref >60)
GFR, EST NON AFRICAN AMERICAN: 50 mL/min — AB (ref >60)

## 2018-01-15 LAB — BASIC METABOLIC PANEL
Anion gap: 7 (ref 5–15)
BUN: 10 mg/dL (ref 6–20)
CHLORIDE: 109 mmol/L (ref 98–111)
CO2: 23 mmol/L (ref 22–32)
Calcium: 8.4 mg/dL — ABNORMAL LOW (ref 8.9–10.3)
Creatinine, Ser: 1.1 mg/dL — ABNORMAL HIGH (ref 0.44–1.00)
GFR calc Af Amer: >60 mL/min (ref >60)
GFR, EST NON AFRICAN AMERICAN: 58 mL/min — AB (ref >60)
GLUCOSE: 124 mg/dL — AB (ref 70–99)
POTASSIUM: 4.4 mmol/L (ref 3.5–5.1)
Sodium: 139 mmol/L (ref 135–145)

## 2018-01-15 LAB — CBC
HEMATOCRIT: 36.4 % (ref 36.0–46.0)
HEMATOCRIT: 36.7 % (ref 36.0–46.0)
HEMOGLOBIN: 11.6 g/dL — AB (ref 12.0–15.0)
Hemoglobin: 11.8 g/dL — ABNORMAL LOW (ref 12.0–15.0)
MCH: 28.6 pg (ref 26.0–34.0)
MCH: 28.5 pg (ref 26.0–34.0)
MCHC: 32.4 g/dL (ref 30.0–36.0)
MCHC: 31.6 g/dL (ref 30.0–36.0)
MCV: 88.3 fL (ref 78.0–100.0)
MCV: 90.2 fL (ref 78.0–100.0)
PLATELETS: 185 10*3/uL (ref 150–400)
Platelets: 186 10*3/uL (ref 150–400)
RBC: 4.12 MIL/uL (ref 3.87–5.11)
RBC: 4.07 MIL/uL (ref 3.87–5.11)
RDW: 16.8 % — ABNORMAL HIGH (ref 11.5–15.5)
RDW: 17.4 % — ABNORMAL HIGH (ref 11.5–15.5)
WBC: 13.8 10*3/uL — AB (ref 4.0–10.5)
WBC: 13.4 10*3/uL — ABNORMAL HIGH (ref 4.0–10.5)

## 2018-01-15 LAB — MAGNESIUM
Magnesium: 2.4 mg/dL (ref 1.7–2.4)
Magnesium: 2.2 mg/dL (ref 1.7–2.4)

## 2018-01-15 LAB — POCT I-STAT, CHEM 8
BUN: 13 mg/dL (ref 6–20)
CREATININE: 1.1 mg/dL — AB (ref 0.44–1.00)
Calcium, Ion: 1.24 mmol/L (ref 1.15–1.40)
Chloride: 104 mmol/L (ref 98–111)
GLUCOSE: 138 mg/dL — AB (ref 70–99)
HEMATOCRIT: 34 % — AB (ref 36.0–46.0)
Hemoglobin: 11.6 g/dL — ABNORMAL LOW (ref 12.0–15.0)
Potassium: 4.5 mmol/L (ref 3.5–5.1)
Sodium: 139 mmol/L (ref 135–145)
TCO2: 22 mmol/L (ref 22–32)

## 2018-01-15 MED ORDER — INSULIN DETEMIR 100 UNIT/ML ~~LOC~~ SOLN
15.0000 [IU] | Freq: Once | SUBCUTANEOUS | Status: AC
  Administered 2018-01-15: 15 [IU] via SUBCUTANEOUS
  Filled 2018-01-15: qty 0.15

## 2018-01-15 MED ORDER — INSULIN ASPART 100 UNIT/ML ~~LOC~~ SOLN
0.0000 [IU] | SUBCUTANEOUS | Status: DC
  Administered 2018-01-15 (×2): 4 [IU] via SUBCUTANEOUS
  Administered 2018-01-15 – 2018-01-16 (×3): 2 [IU] via SUBCUTANEOUS
  Administered 2018-01-16: 4 [IU] via SUBCUTANEOUS
  Administered 2018-01-17 (×3): 2 [IU] via SUBCUTANEOUS
  Administered 2018-01-17: 4 [IU] via SUBCUTANEOUS
  Administered 2018-01-17 (×2): 2 [IU] via SUBCUTANEOUS
  Administered 2018-01-18: 4 [IU] via SUBCUTANEOUS
  Administered 2018-01-18 (×2): 8 [IU] via SUBCUTANEOUS
  Administered 2018-01-18 – 2018-01-19 (×2): 2 [IU] via SUBCUTANEOUS

## 2018-01-15 MED ORDER — PROMETHAZINE HCL 25 MG/ML IJ SOLN
6.2500 mg | Freq: Four times a day (QID) | INTRAMUSCULAR | Status: DC | PRN
  Administered 2018-01-15 – 2018-01-20 (×6): 6.25 mg via INTRAVENOUS
  Filled 2018-01-15 (×6): qty 1

## 2018-01-15 MED ORDER — FUROSEMIDE 10 MG/ML IJ SOLN
20.0000 mg | Freq: Two times a day (BID) | INTRAMUSCULAR | Status: AC
  Administered 2018-01-15 (×2): 20 mg via INTRAVENOUS
  Filled 2018-01-15 (×2): qty 2

## 2018-01-15 MED ORDER — ENOXAPARIN SODIUM 30 MG/0.3ML ~~LOC~~ SOLN
30.0000 mg | Freq: Every day | SUBCUTANEOUS | Status: DC
  Administered 2018-01-15 – 2018-01-24 (×10): 30 mg via SUBCUTANEOUS
  Filled 2018-01-15 (×10): qty 0.3

## 2018-01-15 MED ORDER — INSULIN DETEMIR 100 UNIT/ML ~~LOC~~ SOLN
15.0000 [IU] | Freq: Every day | SUBCUTANEOUS | Status: DC
Start: 2018-01-16 — End: 2018-01-25
  Administered 2018-01-17 – 2018-01-25 (×9): 15 [IU] via SUBCUTANEOUS
  Filled 2018-01-15 (×10): qty 0.15

## 2018-01-15 NOTE — Progress Notes (Signed)
[facility] BRIEF SICU PROGRESS NOTE  1 Day Post-Op  S/P Procedure(s) (LRB): CORONARY ARTERY BYPASS GRAFTING (CABG)X3, RIGHT AND LEFT SAPHENOUS VEIN HARVEST, MAMMARY TAKE DOWN. MAMMARY TO LAD, SVG TO PD, SVG TO DISTAL CIRC. (N/A) TRANSESOPHAGEAL ECHOCARDIOGRAM (TEE) (N/A)   Stable day.  Nausea persists NSR w/ stable BP Breathing comfortably on room air UOP adequate  Plan: Continue current plan  Rexene Alberts, MD 01/15/2018 5:07 PM

## 2018-01-15 NOTE — Progress Notes (Signed)
Hemodynamically stable and no arrhythmias.  Will continue to follow along.

## 2018-01-15 NOTE — Discharge Summary (Signed)
Physician Discharge Summary  Patient ID: [name] MRN: [id number] DOB/AGE: 03-20-1966 52 y.o.  Admit date: 01/04/2018 Discharge date: 01/25/2018  Admission Diagnoses:  Patient Active Problem List   Diagnosis Date Noted  . CAD (coronary artery disease), native coronary artery 01/11/2018  . Non-ST elevation (NSTEMI) myocardial infarction ([place])   . MDD (major depressive disorder), single episode, severe , no psychosis ([place])   . DKA (diabetic ketoacidoses) ([place]) 06/16/2017  . Tardive dyskinesia   . DKA, type 1 ([place]) 03/18/2016  . Noncompliance with treatment plan 03/13/2016  . Essential hypertension 01/24/2016  . HLD (hyperlipidemia)   . GERD (gastroesophageal reflux disease)   . AKI (acute kidney injury) ([place])   . Anemia, iron deficiency   . Vitamin B12 deficiency 08/16/2015  . Diabetic gastroparesis ([place])   . Diabetic neuropathy, type I diabetes mellitus ([place]) 05/18/2015  . Anxiety and depression 05/18/2015   Discharge Diagnoses:   Patient Active Problem List   Diagnosis Date Noted  . S/P CABG x 3 01/14/2018  . CAD (coronary artery disease), native coronary artery 01/11/2018  . Non-ST elevation (NSTEMI) myocardial infarction ([place])   . MDD (major depressive disorder), single episode, severe , no psychosis ([place])   . DKA (diabetic ketoacidoses) ([place]) 06/16/2017  . Tardive dyskinesia   . DKA, type 1 ([place]) 03/18/2016  . Noncompliance with treatment plan 03/13/2016  . Essential hypertension 01/24/2016  . HLD (hyperlipidemia)   . GERD (gastroesophageal reflux disease)   . AKI (acute kidney injury) ([place])   . Anemia, iron deficiency   . Vitamin B12 deficiency 08/16/2015  . Diabetic gastroparesis ([place])   . Diabetic neuropathy, type I diabetes mellitus ([place]) 05/18/2015  . Anxiety and depression 05/18/2015   Discharged Condition: good  History of Present Illness:  [name] is a 52 yo AA female with PMH of depression, DM with medicine non compliance and neuropathy and  Hyperlipidemia.  She presented to the ED at [facility] with complaints of abdominal discomfort, nausea, and back pain.  She has been to the ED over 50 times in the past year for DKA and issues with her gastroparesis.  Workup in the ED revealed the patient's blood glucose level to be 892.  Troponin level was obtained and was elevated a 2.37.  EKG did not show significant changes.  She was ruled in for acute MI and DKA.    Hospital Course:   She continued to deny chest pain.  She underwent cardiac catheterization and was found to have multivessel CAD and a reduced EF of 30-35%.  It was felt bypass surgery would be indicated.  She was placed on a heparin drip and Cardiothoracic surgery consult was requested.  She was evaluated by Dr. Servando Snare at which time her blood sugars were much better controlled.  He was in agreement she would require surgical intervention.  However, the patient initially refused surgery due to anxiousness and wish to think about it.  She ultimately decided to proceed with surgery.  The risks and benefits were explained to the patient and she was agreeable to proceed.  She was taken to the operating room on 01/14/2018.  She underwent CABG x 3 utilizing LIMA to LAD, SVG to PDA, and SVG to OM.  She also underwent endoscopic harvest of greater saphenous vein from her right and left thighs.  She tolerated the procedure without difficulty and was taken to the SICU in stable condition.  She was extubated the evening of surgery. She has a history gastroparesis and  had this post op. She was given low dose Reglan which helped the nausea and emesis. Her diet was advanced as tolerated.  She was weaned off Milrinone and Neo Synephrine drips. Lopressor was started and titrated accordingly. She was volume over loaded and diuresed. She had ABL anemia. She did not require a post op transfusion. Last H and H was 11.0/35.6. She was weaned off the insulin drip.   The patient's glucose remained well  controlled.The patient's HGA1C pre op was 8.6. The patient was felt surgically stable for transfer from the ICU to PCTU for further convalescence on 07/**/2019. She continues to progress with cardiac rehab. She was ambulating on room air. She has been tolerating a diet and has had a bowel movement. Epicardial pacing wires were removed on 01/21/2018. Chest tube sutures will be removed the day of discharge. The patient is felt surgically stable for discharge today.    Significant Diagnostic Studies:  EXAM: PORTABLE CHEST 1 VIEW  COMPARISON: 01/16/2018  FINDINGS: Left-sided PICC line is again seen and stable. Postoperative changes are again noted. Right jugular sheath and left thoracostomy catheter have been removed in the interval. Persistent bibasilar atelectatic changes are noted. Small effusions are noted as well. No pneumothorax is seen.  IMPRESSION: Mild bibasilar atelectasis and small effusions. No recurrent pneumothorax following chest tube removal.   Electronically Signed By: Inez Catalina M.D. On: 01/17/2018 07:29  Treatments:  Troy Sine, MD (Primary)    Procedures   LEFT HEART CATH AND CORONARY ANGIOGRAPHY  Conclusion     Ost Ramus to Ramus lesion is 70% stenosed.  Ost LAD lesion is 40% stenosed.  Prox LAD lesion is 50% stenosed.  Prox LAD to Mid LAD lesion is 70% stenosed.  Prox Cx to Mid Cx lesion is 70% stenosed.  Mid Cx to Dist Cx lesion is 60% stenosed.  Mid RCA lesion is 85% stenosed.  Prox RCA lesion is 20% stenosed.  Dist RCA lesion is 50% stenosed.  RPDA lesion is 70% stenosed.   Multi-vessel CAD in this diabetic female with diffuse disease and diabetic appearing vessels.  There is smooth 40% ostial LAD stenosis, diffuse 50% proximal LAD stenosis, significant eccentric calcification in the mid LAD after the diagonal vessel with narrowing of at least 70% and diffuse distal LAD narrowing; diffusely narrowed proximal to mid ramus  intermediate vessel; large left circumflex coronary artery with 70% followed by 60% mid AV groove stenosis after the takeoff of the first marginal vessel; and small caliber RCA with diffuse disease with 20% proximal RCA narrowing,  focal 85% mid stenosis, diffuse 50% narrowing before the PDA takeoff, 70% mid PDA stenosis.  Moderately severe global LV dysfunction with an ejection fraction of 30 to 35%.  LVEDP 23 mm.   Coronary artery bypass grafting x3 with the left internal mammary to the left anterior descending coronary artery, reverse saphenous vein graft to the posterior descending coronary artery, reverse saphenous vein graft to the  circumflex coronary artery with bilateral thigh greater saphenous endovein harvesting by Dr. Servando Snare on 01/14/2018.   Discharge Exam: Blood pressure 137/74, pulse 91, temperature 98.4 F (36.9 C), temperature source Oral, resp. rate (!) 24, height 5\' 1"  (1.549 m), weight 65 kg (143 lb 4.8 oz), SpO2 94 %.   General appearance: alert, cooperative and no distress Heart: regular rate and rhythm, S1, S2 normal, no murmur, click, rub or gallop Lungs: clear to auscultation bilaterally Abdomen: soft, non-tender; bowel sounds normal; no masses,  no organomegaly Extremities: extremities normal,  atraumatic, no cyanosis or edema Wound: clean and dry    Disposition: Discharge disposition: 03-Skilled Nursing Facility     Stable and discharged to home.   Allergies as of 01/25/2018      Reactions   Anesthetics, Amide Nausea And Vomiting   Penicillins Diarrhea, Nausea And Vomiting, Other (See Comments)   Has patient had a PCN reaction causing immediate rash, facial/tongue/throat swelling, SOB or lightheadedness with hypotension: Yes Has patient had a PCN reaction causing severe rash involving mucus membranes or skin necrosis: No Has patient had a PCN reaction that required hospitalization No Has patient had a PCN reaction occurring within the last 10 years: Yes   If all of the above answers are "NO", then may proceed with Cephalosporin use.   Buprenorphine Hcl Rash   Encainide Nausea And Vomiting   Metoclopramide Other (See Comments)   Dystonia, muscle rigidity.       Medication List    STOP taking these medications   mirtazapine 15 MG tablet Commonly known as:  REMERON   pregabalin 150 MG capsule Commonly known as:  LYRICA     TAKE these medications   acetaminophen 325 MG tablet Commonly known as:  TYLENOL Take 2 tablets (650 mg total) by mouth every 6 (six) hours as needed for mild pain.   aspirin 325 MG EC tablet Take 1 tablet (325 mg total) by mouth daily.   atorvastatin 80 MG tablet Commonly known as:  LIPITOR Take 1 tablet (80 mg total) by mouth daily at 6 PM.   bisacodyl 5 MG EC tablet Commonly known as:  DULCOLAX Take 5 mg by mouth daily as needed for moderate constipation.   busPIRone 10 MG tablet Commonly known as:  BUSPAR Take 1 tablet (10 mg total) by mouth 3 (three) times daily.   fluticasone 50 MCG/ACT nasal spray Commonly known as:  FLONASE Place 2 sprays into both nostrils daily.   furosemide 20 MG tablet Commonly known as:  LASIX Take 1 tablet (20 mg total) by mouth daily. What changed:    when to take this  reasons to take this   gabapentin 300 MG capsule Commonly known as:  NEURONTIN Take 1 capsule (300 mg total) by mouth 2 (two) times daily.   guaiFENesin 600 MG 12 hr tablet Commonly known as:  MUCINEX Take 2 tablets (1,200 mg total) by mouth 2 (two) times daily for 7 days.   insulin aspart 100 UNIT/ML injection Commonly known as:  novoLOG Inject 0-12 Units into the skin 4 (four) times daily -  before meals and at bedtime. Uses a sliding scale: 70-119 = 0 units, 120-150 = 2 units, 151-180 =  4 units, 181-210 = 5 units, 211-240 = 6 units, 241-270 = 8 units, 271-300 = 9 units, 301-330 = 10 units, 331-360 = 11 units, 361-400 = 12 units, greater than 400 notify MD.   insulin detemir 100 UNIT/ML  injection Commonly known as:  LEVEMIR Inject 0.15 mLs (15 Units total) into the skin every morning.   metoprolol tartrate 25 MG tablet Commonly known as:  LOPRESSOR Take 0.5 tablets (12.5 mg total) by mouth 2 (two) times daily.   ondansetron 4 MG disintegrating tablet Commonly known as:  ZOFRAN ODT Take 1 tablet (4 mg total) by mouth every 8 (eight) hours as needed for nausea or vomiting.   pantoprazole 40 MG tablet Commonly known as:  PROTONIX Take 1 tablet (40 mg total) by mouth daily.   polyethylene glycol packet Commonly known as:  MIRALAX / GLYCOLAX Take 17 g by mouth daily as needed for mild constipation.   potassium chloride 10 MEQ tablet Commonly known as:  K-DUR,KLOR-CON Take 1 tablet (10 mEq total) by mouth daily.   promethazine 12.5 MG tablet Commonly known as:  PHENERGAN Take 1 tablet (12.5 mg total) by mouth every 6 (six) hours as needed for nausea or vomiting.   pyridostigmine 60 MG tablet Commonly known as:  MESTINON Take 0.5 tablets (30 mg total) by mouth 3 (three) times daily. Take 1/2 with meals   SYRINGE 3CC/22GX1-1/2" 22G X 1-1/2" 3 ML Misc Inject 1,000 mcg into the muscle daily.     The patient has been discharged on:   1.Beta Blocker:  Yes [ x  ]                              No   [   ]                              If No, reason:  2.Ace Inhibitor/ARB: Yes [   ]                                     No  [ x   ]                                     If No, reason: titration of BB  3.Statin:   Yes [  x ]                  No  [   ]                  If No, reason:  4.Ecasa:  Yes  [ x  ]                  No   [   ]                  If No, reason:   Follow-up Information    Burtis Junes, NP Follow up.   Specialties:  Nurse Practitioner, Interventional Cardiology, Cardiology, Radiology Why:  [facility] office -  02/03/18 at 11am as below. Cecille Rubin is one of the nurse practitioners that works with Dr. Acie Fredrickson and the cardiology team.  Please arrive 15 minutes prior to appointment to check in. Contact information: [place]. [address] [place] [address] [phone]        Grace Isaac, MD. Go on 02/20/2018.   Specialty:  Cardiothoracic Surgery Why:  PA/LAT CXR to be taken (at [facility] which is in the same building as Dr. Gretchen Short office on 02/20/2018 at 8:30 am;Appointment time is at 9:00 am Contact information: [place] [address] [phone]        [name], MD. Call.   Specialty:  Family Medicine Why:  for a follow up appointment regarding further diabetes management and surveillance of HGA1C pre op 8.6 Contact information: [place] [address] [phone]          1. Please obtain vital signs at least one time daily 2.Please weigh the  patient daily. If he or she continues to gain weight or develops lower extremity edema, contact the office at [phone]. 3. Ambulate patient at least three times daily and please use sternal precautions.   Signed: Elgie Collard PA-C 01/25/2018, 7:52 AM

## 2018-01-15 NOTE — Care Management Note (Signed)
Case Management Note Marvetta Gibbons RN,BSN [facility]22 Case Manager  [phone]  Patient Details  Name: [name] MRN: [id number] Date of Birth: November 26, 1965  Subjective/Objective:  Pt admitted with DKA, NSTEMI, MVD found on work-up- pt now s/p CABG x3 on 01/14/18                  Action/Plan: PTA pt lived at home, was active with [facility] for HHRN/PT- will need resumption orders at time of discharge for [facility] services- CM to follow for transition of care needs.   Expected Discharge Date:                 Expected Discharge Plan:  [place]  In-House Referral:     Discharge planning Services  CM Consult  Post Acute Care Choice:  Home Health, Resumption of Svcs/PTA Provider Choice offered to:     DME Arranged:    DME Agency:     HH Arranged:    [place] Agency:  [facility]  Status of Service:  In process, will continue to follow  If discussed at Long Length of Stay Meetings, dates discussed:    Discharge Disposition:   Additional Comments:  Dawayne Patricia, RN 01/15/2018, 11:34 AM

## 2018-01-15 NOTE — Anesthesia Postprocedure Evaluation (Signed)
Anesthesia Post Note  Patient: [name]  Procedure(s) Performed: CORONARY ARTERY BYPASS GRAFTING (CABG)X3, RIGHT AND LEFT SAPHENOUS VEIN HARVEST, MAMMARY TAKE DOWN. MAMMARY TO LAD, SVG TO PD, SVG TO DISTAL CIRC. (N/A Chest) TRANSESOPHAGEAL ECHOCARDIOGRAM (TEE) (N/A )     Patient location during evaluation: SICU Anesthesia Type: General Level of consciousness: awake Pain management: pain level controlled Vital Signs Assessment: post-procedure vital signs reviewed and stable Respiratory status: spontaneous breathing, nonlabored ventilation and respiratory function stable Cardiovascular status: stable Postop Assessment: no apparent nausea or vomiting Anesthetic complications: no    Last Vitals:  Vitals:   01/15/18 1530 01/15/18 1649  BP: (!) 126/59   Pulse: (!) 101   Resp: (!) 0   Temp:  36.8 C  SpO2: 100%     Last Pain:  Vitals:   01/15/18 1649  TempSrc: Oral  PainSc:                  Efe Fazzino P Sladen Plancarte

## 2018-01-15 NOTE — Progress Notes (Signed)
Patient ID: [name], female   DOB: 04/10/1966, 52 y.o.   MRN: [id number] TCTS DAILY ICU PROGRESS NOTE                   [address]            [place] [address]          [phone]   1 Day Post-Op Procedure(s) (LRB): CORONARY ARTERY BYPASS GRAFTING (CABG)X3, RIGHT AND LEFT SAPHENOUS VEIN HARVEST, MAMMARY TAKE DOWN. MAMMARY TO LAD, SVG TO PD, SVG TO DISTAL CIRC. (N/A) TRANSESOPHAGEAL ECHOCARDIOGRAM (TEE) (N/A)  Total Length of Stay:  LOS: 11 days   Subjective: Patient complains of nausea, she has allergies to Reglan, has a long history of gastroparesis, she is awake and neurologically intact.  Objective: Vital signs in last 24 hours: Temp:  [98.2 F (36.8 C)-99.3 F (37.4 C)] 98.4 F (36.9 C) (07/24 0700) Pulse Rate:  [74-108] 82 (07/24 0700) Cardiac Rhythm: Normal sinus rhythm (07/24 0400) Resp:  [0-36] 17 (07/24 0700) BP: (89-129)/(51-67) 119/56 (07/24 0700) SpO2:  [95 %-100 %] 100 % (07/24 0700) Arterial Line BP: (104-189)/(36-79) 142/55 (07/24 0700) FiO2 (%):  [40 %-50 %] 40 % (07/23 2000) Weight:  [156 lb 15.5 oz (71.2 kg)] 156 lb 15.5 oz (71.2 kg) (07/24 0500)  Filed Weights   01/05/18 2106 01/14/18 0500 01/15/18 0500  Weight: 145 lb 11.6 oz (66.1 kg) 148 lb 8 oz (67.4 kg) 156 lb 15.5 oz (71.2 kg)    Weight change: 8 lb 7.5 oz (3.841 kg)   Hemodynamic parameters for last 24 hours: PAP: (21-36)/(11-19) 28/15 CO:  [2.9 L/min-4.3 L/min] 4.3 L/min CI:  [1.7 L/min/m2-2.6 L/min/m2] 2.6 L/min/m2  Intake/Output from previous day: 07/23 0701 - 07/24 0700 In: 5700.5 [I.V.:3285.4; Blood:780; IV Piggyback:1635.1] Out: 9480 [XKPVV:7482; Emesis/NG output:100; Blood:800; Chest Tube:758]  Intake/Output this shift: No intake/output data recorded.  Current Meds: Scheduled Meds: . acetaminophen  1,000 mg Oral Q6H   Or  . acetaminophen (TYLENOL) oral liquid 160 mg/5 mL  1,000 mg Per Tube Q6H  . aspirin EC  325 mg Oral Daily   Or  . aspirin  324 mg  Per Tube Daily  . atorvastatin  80 mg Oral q1800  . bisacodyl  10 mg Oral Daily   Or  . bisacodyl  10 mg Rectal Daily  . busPIRone  10 mg Oral TID  . Chlorhexidine Gluconate Cloth  6 each Topical Daily  . docusate sodium  200 mg Oral Daily  . fluticasone  2 spray Each Nare Daily  . insulin regular  0-10 Units Intravenous TID WC  . mouth rinse  15 mL Mouth Rinse BID  . metoprolol tartrate  12.5 mg Oral BID   Or  . metoprolol tartrate  12.5 mg Per Tube BID  . [START ON 01/16/2018] pantoprazole  40 mg Oral Daily  . pregabalin  150 mg Oral Daily  . pyridostigmine  30 mg Oral TID WC  . sodium chloride flush  10 mL Intracatheter Q12H  . sodium chloride flush  3 mL Intravenous Q12H   Continuous Infusions: . sodium chloride 20 mL/hr at 01/15/18 0600  . sodium chloride    . sodium chloride 10 mL/hr at 01/15/18 0600  . dexmedetomidine (PRECEDEX) IV infusion Stopped (01/14/18 1804)  . insulin (NOVOLIN-R) infusion 1.2 mL/hr at 01/15/18 0700  . lactated ringers    . lactated ringers Stopped (01/15/18 0000)  . lactated ringers 10 mL/hr at 01/15/18 0700  . levofloxacin (  LEVAQUIN) IV    . milrinone 0.3 mcg/kg/min (01/15/18 0700)  . nitroGLYCERIN Stopped (01/15/18 0631)  . phenylephrine (NEO-SYNEPHRINE) Adult infusion 40 mcg/min (01/15/18 0700)   PRN Meds:.sodium chloride, lactated ringers, metoprolol tartrate, midazolam, morphine injection, ondansetron (ZOFRAN) IV, oxyCODONE, sodium chloride flush, sodium chloride flush, traMADol  General appearance: alert, cooperative and no distress Neurologic: intact Heart: regular rate and rhythm, S1, S2 normal, no murmur, click, rub or gallop Lungs: diminished breath sounds bibasilar Abdomen: soft, non-tender; bowel sounds normal; no masses,  no organomegaly Extremities: extremities normal, atraumatic, no cyanosis or edema and Homans sign is negative, no sign of DVT Wound: Sternum intact  Lab Results: CBC: Recent Labs    01/14/18 2007  01/14/18 2021 01/15/18 0304  WBC 11.4*  --  13.8*  HGB 12.7 12.2 11.8*  HCT 39.2 36.0 36.4  PLT 178  --  185   BMET:  Recent Labs    01/14/18 0415  01/14/18 2005 01/14/18 2007 01/14/18 2021 01/15/18 0304  NA 138   < > 139  --  139 139  K 4.0   < > 5.4*  --  5.0 4.4  CL 103   < > 105  --   --  109  CO2 28  --   --   --   --  23  GLUCOSE 194*   < > 170*  --  166* 124*  BUN 7   < > 7  --   --  10  CREATININE 0.92   < > 0.80 0.98  --  1.10*  CALCIUM 9.1  --   --   --   --  8.4*   < > = values in this interval not displayed.    CMET: Lab Results  Component Value Date   WBC 13.8 (H) 01/15/2018   HGB 11.8 (L) 01/15/2018   HCT 36.4 01/15/2018   PLT 185 01/15/2018   GLUCOSE 124 (H) 01/15/2018   CHOL 121 01/07/2018   TRIG 101 01/07/2018   HDL 47 01/07/2018   LDLCALC 54 01/07/2018   ALT 22 01/07/2018   AST 29 01/07/2018   NA 139 01/15/2018   K 4.4 01/15/2018   CL 109 01/15/2018   CREATININE 1.10 (H) 01/15/2018   BUN 10 01/15/2018   CO2 23 01/15/2018   TSH 0.632 03/25/2017   INR 1.33 01/14/2018   HGBA1C 8.6 (H) 01/08/2018   MICROALBUR 24.6 09/04/2016      PT/INR:  Recent Labs    01/14/18 1433  LABPROT 16.3*  INR 1.33   Radiology: Dg Chest Port 1 View  Result Date: 01/14/2018 CLINICAL DATA:  Chest pain.  CABG. EXAM: PORTABLE CHEST 1 VIEW COMPARISON:  01/13/2018. FINDINGS: Endotracheal tube noted with tip 1.9 cm above the carina. NG tube noted with tip below left hemidiaphragm. Swan-Ganz catheter with tip in the pulmonary outflow tract. Left PICC line noted with tip at cavoatrial junction. Left chest tube noted with tip over the left upper chest. No pneumothorax. Prior CABG. Heart size normal. Right lower lung infiltrate with right-sided pleural effusion. IMPRESSION: 1. Prior CABG. Heart size normal. Endotracheal tube noted with tip 1.9 cm above the carina. NG tube noted with tip below left hemidiaphragm. Swan-Ganz catheter noted with tip in the pulmonary outflow  tract. Left PICC line noted with tip at cavoatrial junction. Left chest tube noted with tip over the left upper chest. No pneumothorax. 2.  Right lower lung infiltrate with right-sided pleural effusion. Electronically Signed   By: Marcello Moores  Register   On: 01/14/2018 14:59     Assessment/Plan: S/P Procedure(s) (LRB): CORONARY ARTERY BYPASS GRAFTING (CABG)X3, RIGHT AND LEFT SAPHENOUS VEIN HARVEST, MAMMARY TAKE DOWN. MAMMARY TO LAD, SVG TO PD, SVG TO DISTAL CIRC. (N/A) TRANSESOPHAGEAL ECHOCARDIOGRAM (TEE) (N/A) Mobilize Diuresis Diabetes control See progression orders GI function nausea will be ongoing problem Expected Acute  Blood - loss Anemia- continue to monitor  Continue insulin drip for blood glucose control Renal function stable  Grace Isaac 01/15/2018 7:13 AM

## 2018-01-16 ENCOUNTER — Telehealth: Payer: Self-pay | Admitting: Nurse Practitioner

## 2018-01-16 ENCOUNTER — Inpatient Hospital Stay (HOSPITAL_COMMUNITY): Payer: Medicare Other

## 2018-01-16 LAB — TYPE AND SCREEN
ABO/RH(D): O POS
Antibody Screen: NEGATIVE
UNIT DIVISION: 0
UNIT DIVISION: 0
Unit division: 0
Unit division: 0
Unit division: 0
Unit division: 0

## 2018-01-16 LAB — BPAM RBC
BLOOD PRODUCT EXPIRATION DATE: 201908152359
Blood Product Expiration Date: 201907232359
Blood Product Expiration Date: 201908142359
Blood Product Expiration Date: 201908142359
Blood Product Expiration Date: 201908152359
Blood Product Expiration Date: 201908152359
ISSUE DATE / TIME: 201907211538
ISSUE DATE / TIME: 201907211958
ISSUE DATE / TIME: 201907230817
ISSUE DATE / TIME: 201907230817
ISSUE DATE / TIME: 201907231051
ISSUE DATE / TIME: 201907231051
UNIT TYPE AND RH: 5100
UNIT TYPE AND RH: 5100
UNIT TYPE AND RH: 5100
Unit Type and Rh: 5100
Unit Type and Rh: 5100
Unit Type and Rh: 9500

## 2018-01-16 LAB — GLUCOSE, CAPILLARY
GLUCOSE-CAPILLARY: 107 mg/dL — AB (ref 70–99)
GLUCOSE-CAPILLARY: 134 mg/dL — AB (ref 70–99)
GLUCOSE-CAPILLARY: 107 mg/dL — AB (ref 70–99)
GLUCOSE-CAPILLARY: 150 mg/dL — AB (ref 70–99)
Glucose-Capillary: 167 mg/dL — ABNORMAL HIGH (ref 70–99)

## 2018-01-16 LAB — COOXEMETRY PANEL
Carboxyhemoglobin: 1.1 % (ref 0.5–1.5)
Methemoglobin: 1.5 % (ref 0.0–1.5)
O2 Saturation: 74 %
Total hemoglobin: 11.5 g/dL — ABNORMAL LOW (ref 12.0–16.0)

## 2018-01-16 LAB — CBC
HCT: 36.2 % (ref 36.0–46.0)
Hemoglobin: 11.5 g/dL — ABNORMAL LOW (ref 12.0–15.0)
MCH: 29 pg (ref 26.0–34.0)
MCHC: 31.8 g/dL (ref 30.0–36.0)
MCV: 91.4 fL (ref 78.0–100.0)
Platelets: 200 10*3/uL (ref 150–400)
RBC: 3.96 MIL/uL (ref 3.87–5.11)
RDW: 17.3 % — ABNORMAL HIGH (ref 11.5–15.5)
WBC: 15.1 10*3/uL — ABNORMAL HIGH (ref 4.0–10.5)

## 2018-01-16 LAB — BASIC METABOLIC PANEL
Anion gap: 9 (ref 5–15)
BUN: 13 mg/dL (ref 6–20)
CO2: 26 mmol/L (ref 22–32)
Calcium: 9 mg/dL (ref 8.9–10.3)
Chloride: 110 mmol/L (ref 98–111)
Creatinine, Ser: 1.35 mg/dL — ABNORMAL HIGH (ref 0.44–1.00)
GFR calc Af Amer: 52 mL/min — ABNORMAL LOW (ref >60)
GFR calc non Af Amer: 45 mL/min — ABNORMAL LOW (ref >60)
Glucose, Bld: 148 mg/dL — ABNORMAL HIGH (ref 70–99)
Potassium: 4.1 mmol/L (ref 3.5–5.1)
Sodium: 145 mmol/L (ref 135–145)

## 2018-01-16 MED ORDER — POTASSIUM CHLORIDE CRYS ER 20 MEQ PO TBCR: 20.0000 meq | EXTENDED_RELEASE_TABLET | Freq: Once | ORAL | Status: DC

## 2018-01-16 MED ORDER — FUROSEMIDE 10 MG/ML IJ SOLN
20.0000 mg | Freq: Two times a day (BID) | INTRAMUSCULAR | Status: AC
  Administered 2018-01-16 (×2): 20 mg via INTRAVENOUS
  Filled 2018-01-16 (×2): qty 2

## 2018-01-16 MED ORDER — POTASSIUM CHLORIDE 10 MEQ/50ML IV SOLN
10.0000 meq | INTRAVENOUS | Status: AC
  Administered 2018-01-16 (×2): 10 meq via INTRAVENOUS
  Filled 2018-01-16 (×2): qty 50

## 2018-01-16 MED ORDER — METOCLOPRAMIDE HCL 5 MG/ML IJ SOLN
5.0000 mg | Freq: Three times a day (TID) | INTRAMUSCULAR | Status: AC
  Administered 2018-01-16 (×2): 5 mg via INTRAVENOUS
  Filled 2018-01-16 (×2): qty 2

## 2018-01-16 MED ORDER — METOCLOPRAMIDE HCL 5 MG/ML IJ SOLN
5.0000 mg | Freq: Once | INTRAMUSCULAR | Status: AC
  Administered 2018-01-16: 5 mg via INTRAVENOUS
  Filled 2018-01-16: qty 2

## 2018-01-16 MED FILL — Potassium Chloride Inj 2 mEq/ML: INTRAVENOUS | Qty: 40 | Status: AC

## 2018-01-16 MED FILL — Electrolyte-R (PH 7.4) Solution: INTRAVENOUS | Qty: 3000 | Status: AC

## 2018-01-16 MED FILL — Heparin Sodium (Porcine) Inj 1000 Unit/ML: INTRAMUSCULAR | Qty: 10 | Status: AC

## 2018-01-16 MED FILL — Sodium Chloride IV Soln 0.9%: INTRAVENOUS | Qty: 2000 | Status: AC

## 2018-01-16 MED FILL — Mannitol IV Soln 20%: INTRAVENOUS | Qty: 500 | Status: AC

## 2018-01-16 MED FILL — Lidocaine HCl(Cardiac) IV PF Soln Pref Syr 100 MG/5ML (2%): INTRAVENOUS | Qty: 5 | Status: AC

## 2018-01-16 MED FILL — Magnesium Sulfate Inj 50%: INTRAMUSCULAR | Qty: 10 | Status: AC

## 2018-01-16 MED FILL — Heparin Sodium (Porcine) Inj 1000 Unit/ML: INTRAMUSCULAR | Qty: 30 | Status: AC

## 2018-01-16 MED FILL — Sodium Bicarbonate IV Soln 8.4%: INTRAVENOUS | Qty: 50 | Status: AC

## 2018-01-16 NOTE — Progress Notes (Signed)
CTS  OOB to chair Nausea better nsr 97% room air Good urine output

## 2018-01-16 NOTE — Progress Notes (Signed)
Flutter valve taken to room per MD's order.  Pt states she is unable to do at this time due to nausea and vomiting

## 2018-01-16 NOTE — Progress Notes (Addendum)
TCTS DAILY ICU PROGRESS NOTE                   [name]            [place] [address]          [phone]   2 Days Post-Op Procedure(s) (LRB): CORONARY ARTERY BYPASS GRAFTING (CABG)X3, RIGHT AND LEFT SAPHENOUS VEIN HARVEST, MAMMARY TAKE DOWN. MAMMARY TO LAD, SVG TO PD, SVG TO DISTAL CIRC. (N/A) TRANSESOPHAGEAL ECHOCARDIOGRAM (TEE) (N/A)  Total Length of Stay:  LOS: 12 days   Subjective: Patient not talkative this am. She nods her had when asked if still with nausea.  Objective: Vital signs in last 24 hours: Temp:  [98.1 F (36.7 C)-99.3 F (37.4 C)] 98.6 F (37 C) (07/25 0443) Pulse Rate:  [68-119] 103 (07/25 0700) Cardiac Rhythm: Normal sinus rhythm (07/24 2000) Resp:  [0-49] 11 (07/25 0700) BP: (104-165)/(50-77) 107/59 (07/25 0700) SpO2:  [91 %-100 %] 94 % (07/25 0700) Arterial Line BP: (117-212)/(44-74) 174/51 (07/24 1530) Weight:  [150 lb 5.7 oz (68.2 kg)] 150 lb 5.7 oz (68.2 kg) (07/25 0427)  Filed Weights   01/14/18 0500 01/15/18 0500 01/16/18 0427  Weight: 148 lb 8 oz (67.4 kg) 156 lb 15.5 oz (71.2 kg) 150 lb 5.7 oz (68.2 kg)    Weight change: -6 lb 9.8 oz (-3 kg)   Hemodynamic parameters for last 24 hours: PAP: (24-33)/(11-16) 25/11  Intake/Output from previous day: 07/24 0701 - 07/25 0700 In: 541.4 [I.V.:391.4; IV Piggyback:149.9] Out: 2400 [Urine:1540; Emesis/NG output:750; Chest Tube:110]  Intake/Output this shift: No intake/output data recorded.  Current Meds: Scheduled Meds: . acetaminophen  1,000 mg Oral Q6H   Or  . acetaminophen (TYLENOL) oral liquid 160 mg/5 mL  1,000 mg Per Tube Q6H  . aspirin EC  325 mg Oral Daily   Or  . aspirin  324 mg Per Tube Daily  . atorvastatin  80 mg Oral q1800  . bisacodyl  10 mg Oral Daily   Or  . bisacodyl  10 mg Rectal Daily  . busPIRone  10 mg Oral TID  . Chlorhexidine Gluconate Cloth  6 each Topical Daily  . docusate sodium  200 mg Oral Daily  . enoxaparin (LOVENOX) injection  30 mg  Subcutaneous QHS  . fluticasone  2 spray Each Nare Daily  . insulin aspart  0-24 Units Subcutaneous Q4H  . insulin detemir  15 Units Subcutaneous Daily  . mouth rinse  15 mL Mouth Rinse BID  . metoprolol tartrate  12.5 mg Oral BID   Or  . metoprolol tartrate  12.5 mg Per Tube BID  . pantoprazole  40 mg Oral Daily  . pregabalin  150 mg Oral Daily  . pyridostigmine  30 mg Oral TID WC  . sodium chloride flush  10 mL Intracatheter Q12H  . sodium chloride flush  3 mL Intravenous Q12H   Continuous Infusions: . sodium chloride 20 mL/hr at 01/15/18 0600  . sodium chloride    . sodium chloride 10 mL/hr at 01/15/18 0600  . lactated ringers    . lactated ringers Stopped (01/15/18 0000)  . lactated ringers 10 mL/hr at 01/16/18 0700  . milrinone 0.125 mcg/kg/min (01/16/18 0700)  . nitroGLYCERIN Stopped (01/15/18 0631)  . phenylephrine (NEO-SYNEPHRINE) Adult infusion Stopped (01/15/18 1237)   PRN Meds:.sodium chloride, lactated ringers, metoprolol tartrate, midazolam, morphine injection, ondansetron (ZOFRAN) IV, oxyCODONE, promethazine, sodium chloride flush, sodium chloride flush, traMADol  General appearance: alert and no distress Neurologic:  intact Heart: Tachycardic Lungs: Slightly diminished at bases Abdomen: Soft, non tender, sporadic bowel sounds Extremities: Mild LE edema Wound: Aquaecl intact;bilateral LE dressings clean and dry  Lab Results: CBC: Recent Labs    01/15/18 1705 01/16/18 0216  WBC 13.4* 15.1*  HGB 11.6* 11.5*  HCT 36.7 36.2  PLT 186 200   BMET:  Recent Labs    01/15/18 0304 01/15/18 1703 01/15/18 1705 01/16/18 0639  NA 139 139  --  145  K 4.4 4.5  --  4.1  CL 109 104  --  110  CO2 23  --   --  26  GLUCOSE 124* 138*  --  148*  BUN 10 13  --  13  CREATININE 1.10* 1.10* 1.24* 1.35*  CALCIUM 8.4*  --   --  9.0    CMET: Lab Results  Component Value Date   WBC 15.1 (H) 01/16/2018   HGB 11.5 (L) 01/16/2018   HCT 36.2 01/16/2018   PLT 200  01/16/2018   GLUCOSE 148 (H) 01/16/2018   CHOL 121 01/07/2018   TRIG 101 01/07/2018   HDL 47 01/07/2018   LDLCALC 54 01/07/2018   ALT 22 01/07/2018   AST 29 01/07/2018   NA 145 01/16/2018   K 4.1 01/16/2018   CL 110 01/16/2018   CREATININE 1.35 (H) 01/16/2018   BUN 13 01/16/2018   CO2 26 01/16/2018   TSH 0.632 03/25/2017   INR 1.33 01/14/2018   HGBA1C 8.6 (H) 01/08/2018   MICROALBUR 24.6 09/04/2016      PT/INR:  Recent Labs    01/14/18 1433  LABPROT 16.3*  INR 1.33   Radiology: Dg Chest Port 1 View  Result Date: 01/16/2018 CLINICAL DATA:  Chest tube. EXAM: PORTABLE CHEST 1 VIEW COMPARISON:  01/15/2018.  01/14/2018. FINDINGS: Patient is rotated to the left. Interim removal Swan-Ganz catheter. Left chest tube, right IJ sheath, left PICC line stable position. Prior CABG with stable mediastinal prominence and heart size. Persistent bibasilar atelectasis/infiltrates and right-sided pleural effusion. No pneumothorax. IMPRESSION: 1. Prior CABG. Interim removal of Swan-Ganz catheter. Left chest tube, right IJ sheath, left PICC line stable position. No pneumothorax. 2. Persistent bibasilar atelectasis/infiltrates and small right pleural effusion. Electronically Signed   By: Marcello Moores  Register   On: 01/16/2018 07:02     Assessment/Plan: S/P Procedure(s) (LRB): CORONARY ARTERY BYPASS GRAFTING (CABG)X3, RIGHT AND LEFT SAPHENOUS VEIN HARVEST, MAMMARY TAKE DOWN. MAMMARY TO LAD, SVG TO PD, SVG TO DISTAL CIRC. (N/A) TRANSESOPHAGEAL ECHOCARDIOGRAM (TEE) (N/A)  1. CV- ST at times. Co ox 74 this am. On Milrinone drip and Lopressor 12.5 mg bid. Wean off Milrinone.Would like to  increase Lopressor-monitor BP this am. 2. Pulmonary-On room air. Chest tube output 110 cc last 24 hours. CXR this am shows patient is rotated to the left, no pneumothorax, persistent bibasilar atelectasis and small right pleural effusion. Will remove chest tube. Encourage incentive spirometer and flutter valve. 3.  Creatinine slightly increased from 1.24 to 1.35. Re check in am. 4. GI-history of gastroparesis. Per Dr. Servando Snare, give dose of IV Reglan. 5. DM-CBGs 175/1921/167. On Insulin. Pre op HGA1C 8.6. He will need close medical follow up after discharge. 6. ABL anemia-H and H 11.5 and 36.2 7. Volume overload-as discussed with Dr. Servando Snare, give Lasix 20 mg IV bid today   Donielle Liston Alba PA-C 01/16/2018 7:58 AM   Still still bothered by nausea, her allergies Reglan is a little unclear, since no other medication is working we we will try low-dose Long-standing  history of nausea and gastroparesis Mild elevation of creatinine with adequate urine output Wean off milrinone, COOX's are over 60 DC chest tube I have seen and examined [name] and agree with the above assessment  and plan.  Grace Isaac MD Beeper [phone] Office [phone] 01/16/2018 9:23 AM

## 2018-01-16 NOTE — Telephone Encounter (Signed)
Follow up    Correct routing

## 2018-01-16 NOTE — Telephone Encounter (Signed)
New Message    Patient has a TOC appointment with Truitt Merle 8/12 at 11:00am.

## 2018-01-17 ENCOUNTER — Inpatient Hospital Stay (HOSPITAL_COMMUNITY): Payer: Medicare Other

## 2018-01-17 LAB — COOXEMETRY PANEL
Carboxyhemoglobin: 1.4 % (ref 0.5–1.5)
Methemoglobin: 1.7 % — ABNORMAL HIGH (ref 0.0–1.5)
O2 SAT: 65.6 %
TOTAL HEMOGLOBIN: 10.3 g/dL — AB (ref 12.0–16.0)

## 2018-01-17 LAB — BASIC METABOLIC PANEL
Anion gap: 13 (ref 5–15)
BUN: 15 mg/dL (ref 6–20)
CO2: 19 mmol/L — ABNORMAL LOW (ref 22–32)
Calcium: 7.7 mg/dL — ABNORMAL LOW (ref 8.9–10.3)
Chloride: 106 mmol/L (ref 98–111)
Creatinine, Ser: 0.88 mg/dL (ref 0.44–1.00)
GFR calc Af Amer: >60 mL/min (ref >60)
GFR calc non Af Amer: >60 mL/min (ref >60)
Glucose, Bld: 94 mg/dL (ref 70–99)
Potassium: 4 mmol/L (ref 3.5–5.1)
Sodium: 138 mmol/L (ref 135–145)

## 2018-01-17 LAB — CBC
HCT: 34.2 % — ABNORMAL LOW (ref 36.0–46.0)
Hemoglobin: 10.4 g/dL — ABNORMAL LOW (ref 12.0–15.0)
MCH: 28.5 pg (ref 26.0–34.0)
MCHC: 30.4 g/dL (ref 30.0–36.0)
MCV: 93.7 fL (ref 78.0–100.0)
PLATELETS: 226 10*3/uL (ref 150–400)
RBC: 3.65 MIL/uL — AB (ref 3.87–5.11)
RDW: 17.9 % — AB (ref 11.5–15.5)
WBC: 11 10*3/uL — AB (ref 4.0–10.5)

## 2018-01-17 LAB — GLUCOSE, CAPILLARY
GLUCOSE-CAPILLARY: 141 mg/dL — AB (ref 70–99)
GLUCOSE-CAPILLARY: 186 mg/dL — AB (ref 70–99)
GLUCOSE-CAPILLARY: 133 mg/dL — AB (ref 70–99)
Glucose-Capillary: 143 mg/dL — ABNORMAL HIGH (ref 70–99)
Glucose-Capillary: 144 mg/dL — ABNORMAL HIGH (ref 70–99)
Glucose-Capillary: 145 mg/dL — ABNORMAL HIGH (ref 70–99)
Glucose-Capillary: 85 mg/dL (ref 70–99)

## 2018-01-17 MED ORDER — POTASSIUM CHLORIDE CRYS ER 20 MEQ PO TBCR
20.0000 meq | EXTENDED_RELEASE_TABLET | Freq: Once | ORAL | Status: AC
  Administered 2018-01-17: 20 meq via ORAL
  Filled 2018-01-17: qty 1

## 2018-01-17 MED ORDER — LOPERAMIDE HCL 1 MG/7.5ML PO SUSP
2.0000 mg | Freq: Once | ORAL | Status: AC
  Administered 2018-01-17: 2 mg via ORAL
  Filled 2018-01-17: qty 15

## 2018-01-17 MED ORDER — FUROSEMIDE 10 MG/ML IJ SOLN
40.0000 mg | Freq: Once | INTRAMUSCULAR | Status: AC
  Administered 2018-01-17: 40 mg via INTRAVENOUS
  Filled 2018-01-17: qty 4

## 2018-01-17 NOTE — Progress Notes (Addendum)
TCTS DAILY ICU PROGRESS NOTE                   [address]            [place] [address]          [phone]   3 Days Post-Op Procedure(s) (LRB): CORONARY ARTERY BYPASS GRAFTING (CABG)X3, RIGHT AND LEFT SAPHENOUS VEIN HARVEST, MAMMARY TAKE DOWN. MAMMARY TO LAD, SVG TO PD, SVG TO DISTAL CIRC. (N/A) TRANSESOPHAGEAL ECHOCARDIOGRAM (TEE) (N/A)  Total Length of Stay:  LOS: 13 days   Subjective: Patient sitting in chair and about to walk with PT. She states a little less nausea this am.  Objective: Vital signs in last 24 hours: Temp:  [97.8 F (36.6 C)-99.8 F (37.7 C)] 98.1 F (36.7 C) (07/26 0400) Pulse Rate:  [86-117] 98 (07/26 0700) Cardiac Rhythm: Normal sinus rhythm (07/26 0400) Resp:  [0-32] 32 (07/26 0700) BP: (92-139)/(46-78) 121/63 (07/26 0700) SpO2:  [89 %-100 %] 97 % (07/26 0700) Weight:  [145 lb 8.1 oz (66 kg)] 145 lb 8.1 oz (66 kg) (07/26 0600)  Filed Weights   01/15/18 0500 01/16/18 0427 01/17/18 0600  Weight: 156 lb 15.5 oz (71.2 kg) 150 lb 5.7 oz (68.2 kg) 145 lb 8.1 oz (66 kg)    Weight change: -4 lb 13.6 oz (-2.2 kg)      Intake/Output from previous day: 07/25 0701 - 07/26 0700 In: 281.8 [I.V.:231.8; IV Piggyback:50] Out: 5009 [Urine:1615; Chest Tube:10]  Intake/Output this shift: No intake/output data recorded.  Current Meds: Scheduled Meds: . acetaminophen  1,000 mg Oral Q6H   Or  . acetaminophen (TYLENOL) oral liquid 160 mg/5 mL  1,000 mg Per Tube Q6H  . aspirin EC  325 mg Oral Daily   Or  . aspirin  324 mg Per Tube Daily  . atorvastatin  80 mg Oral q1800  . bisacodyl  10 mg Oral Daily   Or  . bisacodyl  10 mg Rectal Daily  . busPIRone  10 mg Oral TID  . Chlorhexidine Gluconate Cloth  6 each Topical Daily  . docusate sodium  200 mg Oral Daily  . enoxaparin (LOVENOX) injection  30 mg Subcutaneous QHS  . fluticasone  2 spray Each Nare Daily  . insulin aspart  0-24 Units Subcutaneous Q4H  . insulin detemir  15 Units  Subcutaneous Daily  . mouth rinse  15 mL Mouth Rinse BID  . metoprolol tartrate  12.5 mg Oral BID   Or  . metoprolol tartrate  12.5 mg Per Tube BID  . pantoprazole  40 mg Oral Daily  . pregabalin  150 mg Oral Daily  . pyridostigmine  30 mg Oral TID WC  . sodium chloride flush  10 mL Intracatheter Q12H  . sodium chloride flush  3 mL Intravenous Q12H   Continuous Infusions: . sodium chloride 20 mL/hr at 01/15/18 0600  . sodium chloride    . sodium chloride 10 mL/hr at 01/15/18 0600  . lactated ringers    . lactated ringers Stopped (01/15/18 0000)  . lactated ringers 10 mL/hr at 01/17/18 0600  . nitroGLYCERIN Stopped (01/15/18 0631)  . phenylephrine (NEO-SYNEPHRINE) Adult infusion Stopped (01/15/18 1237)   PRN Meds:.sodium chloride, lactated ringers, metoprolol tartrate, midazolam, morphine injection, ondansetron (ZOFRAN) IV, oxyCODONE, promethazine, sodium chloride flush, sodium chloride flush, traMADol  General appearance: alert and no distress Neurologic: intact Heart: Slightly tachycardic Lungs: Slightly diminished at bases Abdomen: Soft, non tender, sporadic bowel sounds Extremities: Mild LE edema  Wound: Sternal and bilateral LE wounds clean and dry,no signs of infection.  Lab Results: CBC: Recent Labs    01/16/18 0216 01/17/18 0500  WBC 15.1* 11.0*  HGB 11.5* 10.4*  HCT 36.2 34.2*  PLT 200 226   BMET:  Recent Labs    01/16/18 0639 01/17/18 0500  NA 145 138  K 4.1 4.0  CL 110 106  CO2 26 19*  GLUCOSE 148* 94  BUN 13 15  CREATININE 1.35* 0.88  CALCIUM 9.0 7.7*    CMET: Lab Results  Component Value Date   WBC 11.0 (H) 01/17/2018   HGB 10.4 (L) 01/17/2018   HCT 34.2 (L) 01/17/2018   PLT 226 01/17/2018   GLUCOSE 94 01/17/2018   CHOL 121 01/07/2018   TRIG 101 01/07/2018   HDL 47 01/07/2018   LDLCALC 54 01/07/2018   ALT 22 01/07/2018   AST 29 01/07/2018   NA 138 01/17/2018   K 4.0 01/17/2018   CL 106 01/17/2018   CREATININE 0.88 01/17/2018   BUN  15 01/17/2018   CO2 19 (L) 01/17/2018   TSH 0.632 03/25/2017   INR 1.33 01/14/2018   HGBA1C 8.6 (H) 01/08/2018   MICROALBUR 24.6 09/04/2016      PT/INR:  Recent Labs    01/14/18 1433  LABPROT 16.3*  INR 1.33   Radiology: Dg Chest Port 1 View  Result Date: 01/17/2018 CLINICAL DATA:  Follow-up pneumothorax EXAM: PORTABLE CHEST 1 VIEW COMPARISON:  01/16/2018 FINDINGS: Left-sided PICC line is again seen and stable. Postoperative changes are again noted. Right jugular sheath and left thoracostomy catheter have been removed in the interval. Persistent bibasilar atelectatic changes are noted. Small effusions are noted as well. No pneumothorax is seen. IMPRESSION: Mild bibasilar atelectasis and small effusions. No recurrent pneumothorax following chest tube removal. Electronically Signed   By: Inez Catalina M.D.   On: 01/17/2018 07:29     Assessment/Plan: S/P Procedure(s) (LRB): CORONARY ARTERY BYPASS GRAFTING (CABG)X3, RIGHT AND LEFT SAPHENOUS VEIN HARVEST, MAMMARY TAKE DOWN. MAMMARY TO LAD, SVG TO PD, SVG TO DISTAL CIRC. (N/A) TRANSESOPHAGEAL ECHOCARDIOGRAM (TEE) (N/A)  1. CV-  On Lopressor 12.5 mg bid.  2. Pulmonary-On room air. CXR this am shows no pneumothorax, persistent bibasilar atelectasis and small pleural effusions.  Encourage incentive spirometer and flutter valve. 3. Creatinine now within normal limits at 0.88 4. GI-history of gastroparesis. She was given a low dose of Reglan yesterday.  5. DM-CBGs 107/150/143. On Insulin. Pre op HGA1C 8.6. She will need close medical follow up after discharge. 6. ABL anemia-H and H 10.4 and 34.2 7. As discussed with Dr. Servando Snare, Lasix 40 mg IV this am  Donielle Liston Alba PA-C 01/17/2018 7:46 AM   Gi symptoms have improved Walked around unit with pt help today Will need close follow up with dm after d/c, high risk of readmission I have seen and examined [name] and agree with the above assessment  and plan.  Grace Isaac  MD Beeper [phone] Office [phone] 01/17/2018 8:21 AM

## 2018-01-17 NOTE — Progress Notes (Signed)
Physical Therapy Treatment Patient Details Name: [name] MRN: [id number] DOB: [birth year] Today's Date: 01/17/2018    History of Present Illness Pt is a 52 y.o. female with h/o uncontrolled DM type 1, admitted 01/04/18 with DKA; found to have NSTEMI. Cardiac cath 7/16 showed multivessel severe CAD. CABGx3 on 7/23. PMH includes HTN, DM, anxiety, depression.    PT Comments    Pt in chair on arrival with pt educated for all sternal precautions, transfers, gait and function.  Pt reports no 24hr assist at home and flight of stairs to enter apartment. Pt initially hesitant for mobility as she is nervous about falling but with encouragement and reassurance she was able to transfer and walk in hall today. Pt with updated goals and D/C plans post CABG. Will continue to follow acutely to maximize strength, function and adherence to precautions.  HR 98-114, SpOT 97-99% on RA BP 121/63 pre gait and 115/57 post gait   Follow Up Recommendations  Supervision/Assistance - 24 hour;SNF     Equipment Recommendations  Rolling walker with 5" wheels    Recommendations for Other Services       Precautions / Restrictions Precautions Precautions: Fall;Sternal Precaution Booklet Issued: Yes (comment) Restrictions   Mobility  Bed Mobility               General bed mobility comments: Pt up in recliner upon my arrival  Transfers Overall transfer level: Needs assistance Equipment used: None Transfers: Sit to/from Stand Sit to Stand: Min assist         General transfer comment: cues for hands on thighs to rise from chair with min first trial and minguard on 2nd trial  Ambulation/Gait Ambulation/Gait assistance: Min guard Gait Distance (Feet): 150 Feet Assistive device: Rolling walker (2 wheeled) Gait Pattern/deviations: Step-through pattern;Decreased stride length   Gait velocity interpretation: 1.31 - 2.62 ft/sec, indicative of limited community ambulator General Gait Details: cues  for posture, position in RW with chair follow. Pt walked 150' with seated rest then additional 150' back to room. Pt with encouragement to progress and maximize gait.    Stairs             Wheelchair Mobility    Modified Rankin (Stroke Patients Only)       Balance Overall balance assessment: Needs assistance Sitting-balance support: Feet supported;No upper extremity supported Sitting balance-Leahy Scale: Good     Standing balance support: Bilateral upper extremity supported Standing balance-Leahy Scale: Fair                              Cognition Arousal/Alertness: Awake/alert Behavior During Therapy: WFL for tasks assessed/performed Overall Cognitive Status: Within Functional Limits for tasks assessed                                        Exercises General Exercises - Lower Extremity Long Arc Quad: AROM;Seated;Both;15 reps Hip Flexion/Marching: AROM;10 reps;Seated;Both    General Comments        Pertinent Vitals/Pain Pain Assessment: 0-10 Pain Score: 3  Pain Location: chest Pain Descriptors / Indicators: Sore Pain Intervention(s): Limited activity within patient's tolerance;Repositioned    Home Living Family/patient expects to be discharged to:: Skilled nursing facility Living Arrangements: Other relatives Available Help at Discharge: Family;Available PRN/intermittently Type of Home: Apartment Home Access: Stairs to enter Entrance Stairs-Rails: Right Home Layout: One level Home Equipment:  None      Prior Function Level of Independence: Independent      Comments: Reports easily fatigued, limited ambulation distances   PT Goals (current goals can now be found in the care plan section) Acute Rehab PT Goals Patient Stated Goal: to go to rehab then home Time For Goal Achievement: 01/31/18 Potential to Achieve Goals: Good Progress towards PT goals: Goals downgraded-see care plan    Frequency           PT Plan  Discharge plan needs to be updated    Co-evaluation              AM-PAC PT "6 Clicks" Daily Activity  Outcome Measure  Difficulty turning over in bed (including adjusting bedclothes, sheets and blankets)?: Unable Difficulty moving from lying on back to sitting on the side of the bed? : Unable Difficulty sitting down on and standing up from a chair with arms (e.g., wheelchair, bedside commode, etc,.)?: Unable Help needed moving to and from a bed to chair (including a wheelchair)?: A Little Help needed walking in hospital room?: A Little Help needed climbing 3-5 steps with a railing? : A Lot 6 Click Score: 11    End of Session Equipment Utilized During Treatment: Gait belt Activity Tolerance: Patient tolerated treatment well Patient left: in chair;with call bell/phone within reach Nurse Communication: Mobility status PT Visit Diagnosis: Muscle weakness (generalized) (M62.81);Other abnormalities of gait and mobility (R26.89)     Time: 6701-4103 PT Time Calculation (min) (ACUTE ONLY): 29 min  Charges:  $Gait Training: 8-22 mins                     Elwyn Reach, [place]    [place] 01/17/2018, 11:26 AM

## 2018-01-17 NOTE — Evaluation (Signed)
Occupational Therapy Evaluation Patient Details Name: [name] MRN: [id number] DOB: 1965-10-16 Today's Date: 01/17/2018    History of Present Illness Pt is a 52 y.o. female with h/o uncontrolled DM type 1, admitted 01/04/18 with DKA; found to have NSTEMI. Cardiac cath 7/16 showed multivessel severe CAD. CABGx3 on 7/23. PMH includes HTN, DM, anxiety, depression.   Clinical Impression   This 52 yo female admitted and underwent above presents to acute OT with decreased balance, sternal precautions, and pain all affecting her PLOF of being totally independent with basic and IADLs. She will benefit from acute OT with follow up OT at SNF to get back to PLOF.    Follow Up Recommendations  SNF;Supervision/Assistance - 24 hour    Equipment Recommendations  None recommended by OT       Precautions / Restrictions Precautions Precautions: Fall;Sternal Restrictions Weight Bearing Restrictions: Yes RUE Weight Bearing: Non weight bearing LUE Weight Bearing: Non weight bearing      Mobility Bed Mobility               General bed mobility comments: Pt up in recliner upon my arrival  Transfers Overall transfer level: Needs assistance Equipment used: None Transfers: Sit to/from Stand Sit to Stand: Min assist         General transfer comment: increased time with cues for hands holding onto pillow, able to scoot forward reciprocally with cues, need min A to scoot backward reciprocally    Balance Overall balance assessment: Needs assistance Sitting-balance support: Feet supported;No upper extremity supported Sitting balance-Leahy Scale: Good     Standing balance support: No upper extremity supported Standing balance-Leahy Scale: Fair                             ADL either performed or assessed with clinical judgement   ADL Overall ADL's : Needs assistance/impaired Eating/Feeding: Independent;Sitting   Grooming: Set up;Sitting   Upper Body Bathing: Minimal  assistance;Sitting   Lower Body Bathing: Moderate assistance Lower Body Bathing Details (indicate cue type and reason): min A sit<>stand Upper Body Dressing : Moderate assistance;Sitting   Lower Body Dressing: Maximal assistance Lower Body Dressing Details (indicate cue type and reason): min A sit<>stand Toilet Transfer: Minimal assistance;Stand-pivot;BSC   Toileting- Clothing Manipulation and Hygiene: Total assistance Toileting - Clothing Manipulation Details (indicate cue type and reason): min A sit<>stand             Vision Patient Visual Report: No change from baseline              Pertinent Vitals/Pain Pain Assessment: 0-10 Pain Score: 4  Pain Location: chest Pain Descriptors / Indicators: Sore Pain Intervention(s): Limited activity within patient's tolerance;Monitored during session     Hand Dominance Right   Extremity/Trunk Assessment Upper Extremity Assessment Upper Extremity Assessment: Generalized weakness           Communication Communication Communication: No difficulties   Cognition Arousal/Alertness: Awake/alert Behavior During Therapy: WFL for tasks assessed/performed Overall Cognitive Status: Within Functional Limits for tasks assessed                                                Home Living Family/patient expects to be discharged to:: Skilled nursing facility Living Arrangements: Other relatives Available Help at Discharge: Family;Available PRN/intermittently Type of Home: Apartment Home Access:  Stairs to enter [employer] of Steps: 1 flight Entrance Stairs-Rails: Right Home Layout: One level     Bathroom Shower/Tub: Corporate investment banker: Standard     Home Equipment: None          Prior Functioning/Environment Level of Independence: Independent        Comments: Reports easily fatigued, limited ambulation distances        OT Problem List: Decreased strength;Decreased  range of motion;Impaired balance (sitting and/or standing);Pain;Decreased knowledge of use of DME or AE      OT Treatment/Interventions: Self-care/ADL training;Balance training;DME and/or AE instruction;Patient/family education    OT Goals(Current goals can be found in the care plan section) Acute Rehab OT Goals Patient Stated Goal: to go to rehab then home OT Goal Formulation: With patient Time For Goal Achievement: 01/31/18 Potential to Achieve Goals: Good  OT Frequency: Min 2X/week   Barriers to D/C: Decreased caregiver support             AM-PAC PT "6 Clicks" Daily Activity     Outcome Measure Help from another person eating meals?: None Help from another person taking care of personal grooming?: A Little Help from another person toileting, which includes using toliet, bedpan, or urinal?: A Lot Help from another person bathing (including washing, rinsing, drying)?: A Lot Help from another person to put on and taking off regular upper body clothing?: A Lot Help from another person to put on and taking off regular lower body clothing?: A Lot 6 Click Score: 15   End of Session Nurse Communication: (pt had small bowel movement in [facility])  Activity Tolerance: Patient tolerated treatment well Patient left: in chair;with call bell/phone within reach  OT Visit Diagnosis: Unsteadiness on feet (R26.81);Other abnormalities of gait and mobility (R26.89);Muscle weakness (generalized) (M62.81);Pain Pain - part of body: (chest--sore)                Time: 6629-4765 OT Time Calculation (min): 38 min Charges:  OT General Charges $OT Visit: 1 Visit OT Evaluation $OT Eval Moderate Complexity: 1 Mod OT Treatments $Self Care/Home Management : 23-37 mins Golden Circle, OTR/L [phone] 01/17/2018

## 2018-01-17 NOTE — Op Note (Signed)
[name], [name] MEDICAL RECORD [id number] ACCOUNT [id number] DATE OF BIRTH:1966-02-10 FACILITY: [facility] LOCATION: [facility] PHYSICIAN:Renella Steig Maryruth Bun, MD  OPERATIVE REPORT  DATE OF PROCEDURE:  01/14/2018  PREOPERATIVE DIAGNOSIS:  Recent non-STEMI myocardial infarction with severe 3-vessel coronary artery disease.  POSTOPERATIVE DIAGNOSIS:  Recent non-STEMI myocardial infarction with severe 3-vessel coronary artery disease.  SURGICAL PROCEDURE:  Coronary artery bypass grafting x3 with the left internal mammary to the left anterior descending coronary artery, reverse saphenous vein graft to the posterior descending coronary artery, reverse saphenous vein graft to the  circumflex coronary artery with bilateral thigh greater saphenous endovein harvesting.  SURGEON:   Lanelle Bal, MD  FIRST ASSISTANT:  Ellwood Handler, PA  BRIEF HISTORY:  The patient is a long-term diabetic patient , more than 40 years, who presents with nausea, vomiting, shortness of breath.  Troponins were noted to be elevated to 40.  Cardiology consult was requested and the patient underwent cardiac  catheterization by Dr. Claiborne Billings, which demonstrated significant 3-vessel coronary artery disease with significant distal disease, but with the degree of proximal disease in the LAD and circumflex and right coronary arteries of over 70%.  Coronary artery  bypass grafting was recommended to the patient as the best treatment with her in the setting of this significant 3-vessel coronary artery disease and diabetes.    The patient had numerous admissions for gastroparesis and poorly controlled diabetes over the past year.  Her son had counted 48 ER visits.  Risks and options were discussed with the patient in detail and she was agreeable and signed informed consent.  DESCRIPTION OF PROCEDURE:  With Swan-Ganz and arterial line monitors in place, the patient underwent general endotracheal anesthesia without incident.  The skin of  the chest and legs was prepped with Betadine and draped in the usual sterile manner.   Appropriate timeout was performed and we proceeded first with endoscopic vein harvesting because of her preoperative vein mapping.  We initially started harvesting the right greater saphenous vein from the right thigh endoscopically.  A small incision  was made at the knee and a segment of vein was harvested and was adequate for use.  The lower leg vein was small.  We then moved to the left thigh to harvest the left greater saphenous vein from the thigh.  This vein was also suitable for bypass.  Median  sternotomy was performed.  The left internal mammary artery was dissected down as a pedicle graft.  The distal artery was divided and had good free flow.  The pericardium was opened.  Overall, ventricular function was preserved.  The patient was  systemically heparinized.  The ascending aorta was cannulated.  A small dual stage right venous cannula was placed in the right atrium.  The patient was placed on cardiopulmonary bypass 2.4 liters per minute per meter square.  Sites and anastomoses were  selected and dissected at the epicardium.  As noted the patient had significant 3-vessel coronary artery disease and would not be a suitable candidate for redo bypass surgery because of the small caliber and distal disease.  The body temperature was  cooled to 32 degrees.  Aortic crossclamp was applied and 500 mL of cold blood potassium test cardioplegia was administered antegrade.  Myocardial septal temperature was monitored throughout the crossclamp.    Attention was turned first to the posterior descending coronary artery.  The distal right coronary artery was severely diseased.  The proximal posterior descending coronary artery was small but was able to be opened and  a 1 mm probe passed distally.   Using a running 8-0 Prolene a distal anastomosis was performed with a segment of reverse saphenous vein graft.  The heart was then  elevated and circumflex coronary artery was opened.  This vessel was also a relatively small, but did admit a 1 mm probe  distally.  Using a running 8-0 Prolene, distal anastomosis was performed.  Additional cold blood cardioplegia was administered down the vein grafts.  Attention was then turned to the left anterior descending coronary artery between the mid and distal  third.  The vessel was opened and admitted a 1.5 mm probe proximally and a 1 mm probe distally.  Using a running 8-0 Prolene.  The left internal mammary artery was anastomosed to the left anterior descending coronary artery.  With release of the bulldog  on the mammary artery, there was rise in myocardial septal temperature.  The bulldog was placed back on the mammary artery.  Additional cold blood cardioplegia was administered.  With cross clamp still in place, 2 punch aortotomies were performed and  each of the 2 vein grafts were anastomosed to the ascending aorta.  Prior to completion of the anastomoses, the heart was allowed to passively fill and deair.  The bulldog was then removed from the mammary artery and the aortic crossclamp was removed  with total crossclamp time of 72 minutes.  The patient spontaneously converted to a sinus rhythm.  Sites of anastomosis were inspected and free of bleeding.  The patient was then rewarmed to 37 degrees.  She was then ventilated and weaned from  cardiopulmonary bypass without difficulty.  She remained hemodynamically stable.  She was decannulated in the usual fashion.  Total pump time was 121 minutes.  Protamine sulfate was administered with operative field hemostatic graft markers.  Ventricular  and atrial pacing wires were applied.  A left pleural tube and a Blake mediastinal drain were left in place.  The pericardium was loosely reapproximated.  The sternum was closed with #6 stainless steel wire.  Fascia closed with interrupted 0 Vicryl,  running 3-0 Vicryl in subcutaneous tissue and a 4-0  subcuticular stitch on the skin edges.  Dry dressings were applied.  Sponge and needle count was reported as correct at completion of the procedure.    The patient did require packed red blood cells because of preoperative anemia and low hemoglobin while on bypass.  As noted, the patient's diffuse and small coronary vessels would make her not a candidate for redo surgery.    The surgical PA was used as a first assistant to assist in harvesting of the conduit, cannulation, decannulation and construction of anastomoses.  AN/NUANCE  D:01/17/2018 T:01/17/2018 JOB:[id number]

## 2018-01-17 NOTE — Telephone Encounter (Signed)
**Note De-identified  Obfuscation** I left a message on the pts VM asking him to call me back. 

## 2018-01-17 NOTE — Discharge Instructions (Signed)
Coronary Artery Bypass Grafting, Care After °These instructions give you information on caring for yourself after your procedure. Your doctor may also give you more specific instructions. Call your doctor if you have any problems or questions after your procedure. °Follow these instructions at home: °· Only take medicine as told by your doctor. Take medicines exactly as told. Do not stop taking medicines or start any new medicines without talking to your doctor first. °· Take your pulse as told by your doctor. °· Do deep breathing as told by your doctor. Use your breathing device (incentive spirometer), if given, to practice deep breathing several times a day. Support your chest with a pillow or your arms when you take deep breaths or cough. °· Keep the area clean, dry, and protected where the surgery cuts (incisions) were made. Remove bandages (dressings) only as told by your doctor. If strips were applied to surgical area, do not take them off. They fall off on their own. °· Check the surgery area daily for puffiness (swelling), redness, or leaking fluid. °· If surgery cuts were made in your legs: °? Avoid crossing your legs. °? Avoid sitting for long periods of time. Change positions every 30 minutes. °? Raise your legs when you are sitting. Place them on pillows. °· Wear stockings that help keep blood clots from forming in your legs (compression stockings). °· Only take sponge baths until your doctor says it is okay to take showers. Pat the surgery area dry. Do not rub the surgery area with a washcloth or towel. Do not bathe, swim, or use a hot tub until your doctor says it is okay. °· Eat foods that are high in fiber. These include raw fruits and vegetables, whole grains, beans, and nuts. Choose lean meats. Avoid canned, processed, and fried foods. °· Drink enough fluids to keep your pee (urine) clear or pale yellow. °· Weigh yourself every day. °· Rest and limit activity as told by your doctor. You may be told  to: °? Stop any activity if you have chest pain, shortness of breath, changes in heartbeat, or dizziness. Get help right away if this happens. °? Move around often for short amounts of time or take short walks as told by your doctor. Gradually become more active. You may need help to strengthen your muscles and build endurance. °? Avoid lifting, pushing, or pulling anything heavier than 10 pounds (4.5 kg) for at least 6 weeks after surgery. °· Do not drive until your doctor says it is okay. °· Ask your doctor when you can go back to work. °· Ask your doctor when you can begin sexual activity again. °· Follow up with your doctor as told. °Contact a doctor if: °· You have puffiness, redness, more pain, or fluid draining from the incision site. °· You have a fever. °· You have puffiness in your ankles or legs. °· You have pain in your legs. °· You gain 2 or more pounds (0.9 kg) a day. °· You feel sick to your stomach (nauseous) or throw up (vomit). °· You have watery poop (diarrhea). °Get help right away if: °· You have chest pain that goes to your jaw or arms. °· You have shortness of breath. °· You have a fast or irregular heartbeat. °· You notice a "clicking" in your breastbone when you move. °· You have numbness or weakness in your arms or legs. °· You feel dizzy or light-headed. °This information is not intended to replace advice given to you by   your health care provider. Make sure you discuss any questions you have with your health care provider. °Document Released: 06/16/2013 Document Revised: 11/17/2015 Document Reviewed: 11/18/2012 °Elsevier Interactive Patient Education © 2017 [employer] ° °

## 2018-01-17 NOTE — Progress Notes (Signed)
Received diabetes coordinator consult. Spoke with patient about her diabetes. She was diagnosed at age 53 and has been on insulin ever since then. States that she is followed at [facility] clinic and can get her insulin there. She is NOT on an insulin pump at home. She has been taking Levemir and Novolog at home per vial and syringe. Her DKA happens when  her gastroparesis causes her to not be able to eat, therefore she does not take her insulin.  Talked with her about sick day rules and management and was given information on how to handle. Stressed that she must take her insulin and check blood sugars frequently, at least every 4 hours. If blood sugars continue to be elevated (>240 mg/dl), she needs to let her doctor know. Need to stay hydrated per list of suggested foods. This patient has been followed by our inpatient diabetes team many times. Did remind her that her diabetes and high blood sugar does effect her heart and other areas of the body. Patient sitting up in chair and seemed comfortable and not in pain at this time.   Harvel Ricks RN BSN CDE Diabetes Coordinator Pager: [phone]  8am-5pm

## 2018-01-17 NOTE — Progress Notes (Signed)
      [address]       [place] [address]             [phone]      POD # 3 CABG  Up in chair  BP 114/61   Pulse 82   Temp 97.9 F (36.6 C) (Oral)   Resp 17   Ht 5\' 1"  (1.549 m)   Wt 145 lb 8.1 oz (66 kg)   SpO2 97%   BMI 27.49 kg/m   Intake/Output Summary (Last 24 hours) at 01/17/2018 1707 Last data filed at 01/17/2018 1500 Gross per 24 hour  Intake 187.62 ml  Output 2275 ml  Net -2087.38 ml   CBG 141-186  Continue current Rx  Halyn Flaugher C. Roxan Hockey, MD [facility] [phone]

## 2018-01-18 ENCOUNTER — Inpatient Hospital Stay (HOSPITAL_COMMUNITY): Payer: Medicare Other

## 2018-01-18 LAB — GLUCOSE, CAPILLARY
GLUCOSE-CAPILLARY: 116 mg/dL — AB (ref 70–99)
GLUCOSE-CAPILLARY: 183 mg/dL — AB (ref 70–99)
GLUCOSE-CAPILLARY: 209 mg/dL — AB (ref 70–99)
GLUCOSE-CAPILLARY: 134 mg/dL — AB (ref 70–99)
Glucose-Capillary: 230 mg/dL — ABNORMAL HIGH (ref 70–99)
Glucose-Capillary: 77 mg/dL (ref 70–99)

## 2018-01-18 LAB — CBC
HCT: 34.2 % — ABNORMAL LOW (ref 36.0–46.0)
HEMOGLOBIN: 10.5 g/dL — AB (ref 12.0–15.0)
MCH: 28.6 pg (ref 26.0–34.0)
MCHC: 30.7 g/dL (ref 30.0–36.0)
MCV: 93.2 fL (ref 78.0–100.0)
PLATELETS: 196 10*3/uL (ref 150–400)
RBC: 3.67 MIL/uL — AB (ref 3.87–5.11)
RDW: 17.5 % — ABNORMAL HIGH (ref 11.5–15.5)
WBC: 7.9 10*3/uL (ref 4.0–10.5)

## 2018-01-18 LAB — BASIC METABOLIC PANEL
Anion gap: 9 (ref 5–15)
BUN: 16 mg/dL (ref 6–20)
CO2: 28 mmol/L (ref 22–32)
Calcium: 8.7 mg/dL — ABNORMAL LOW (ref 8.9–10.3)
Chloride: 103 mmol/L (ref 98–111)
Creatinine, Ser: 1.35 mg/dL — ABNORMAL HIGH (ref 0.44–1.00)
GFR calc Af Amer: 52 mL/min — ABNORMAL LOW (ref >60)
GFR calc non Af Amer: 45 mL/min — ABNORMAL LOW (ref >60)
Glucose, Bld: 45 mg/dL — ABNORMAL LOW (ref 70–99)
Potassium: 3.4 mmol/L — ABNORMAL LOW (ref 3.5–5.1)
Sodium: 140 mmol/L (ref 135–145)

## 2018-01-18 MED ORDER — DEXTROSE 50 % IV SOLN
INTRAVENOUS | Status: AC
  Administered 2018-01-18: 25 mL
  Filled 2018-01-18: qty 50

## 2018-01-18 MED ORDER — POTASSIUM CHLORIDE 10 MEQ/50ML IV SOLN
10.0000 meq | INTRAVENOUS | Status: AC | PRN
  Administered 2018-01-18 (×3): 10 meq via INTRAVENOUS
  Filled 2018-01-18 (×3): qty 50

## 2018-01-18 NOTE — Progress Notes (Signed)
      [address]       [place], [address]             [phone]      up in chair  BP 105/61   Pulse 80   Temp 98.6 F (37 C) (Oral)   Resp 16   Ht 5\' 1"  (1.549 m)   Wt 146 lb 2.6 oz (66.3 kg)   SpO2 96%   BMI 27.62 kg/m   Intake/Output Summary (Last 24 hours) at 01/18/2018 1637 Last data filed at 01/18/2018 1600 Gross per 24 hour  Intake 2100.31 ml  Output 550 ml  Net 1550.31 ml   Stable day  Remo Lipps C. Roxan Hockey, MD [facility] [phone]

## 2018-01-18 NOTE — Progress Notes (Signed)
4 Days Post-Op Procedure(s) (LRB): CORONARY ARTERY BYPASS GRAFTING (CABG)X3, RIGHT AND LEFT SAPHENOUS VEIN HARVEST, MAMMARY TAKE DOWN. MAMMARY TO LAD, SVG TO PD, SVG TO DISTAL CIRC. (N/A) TRANSESOPHAGEAL ECHOCARDIOGRAM (TEE) (N/A) Subjective: Feels a little better this AM  Objective: Vital signs in last 24 hours: Temp:  [97.6 F (36.4 C)-98.2 F (36.8 C)] 97.6 F (36.4 C) (07/27 0818) Pulse Rate:  [77-99] 91 (07/27 0700) Cardiac Rhythm: Normal sinus rhythm (07/27 0600) Resp:  [0-28] 18 (07/27 0700) BP: (80-128)/(47-69) 118/64 (07/27 0700) SpO2:  [92 %-99 %] 99 % (07/27 0700) Weight:  [146 lb 2.6 oz (66.3 kg)] 146 lb 2.6 oz (66.3 kg) (07/27 0600)  Hemodynamic parameters for last 24 hours:    Intake/Output from previous day: 07/26 0701 - 07/27 0700 In: 1802.9 [P.O.:720; I.V.:1029.8; IV Piggyback:53] Out: 1610 [Urine:1675] Intake/Output this shift: Total I/O In: 280 [P.O.:280] Out: -   General appearance: alert, cooperative and no distress Neurologic: intact Heart: regular rate and rhythm Lungs: diminished breath sounds bibasilar Abdomen: normal findings: soft, non-tender  Lab Results: Recent Labs    01/17/18 0500 01/18/18 0312  WBC 11.0* 7.9  HGB 10.4* 10.5*  HCT 34.2* 34.2*  PLT 226 196   BMET:  Recent Labs    01/17/18 0500 01/18/18 0312  NA 138 140  K 4.0 3.4*  CL 106 103  CO2 19* 28  GLUCOSE 94 45*  BUN 15 16  CREATININE 0.88 1.35*  CALCIUM 7.7* 8.7*    PT/INR: No results for input(s): LABPROT, INR in the last 72 hours. ABG    Component Value Date/Time   PHART 7.348 (L) 01/14/2018 2211   HCO3 20.2 01/14/2018 2211   TCO2 22 01/15/2018 1703   ACIDBASEDEF 5.0 (H) 01/14/2018 2211   O2SAT 65.6 01/17/2018 0835   CBG (last 3)  Recent Labs    01/17/18 2343 01/18/18 0452 01/18/18 0816  GLUCAP 85 116* 183*    Assessment/Plan: S/P Procedure(s) (LRB): CORONARY ARTERY BYPASS GRAFTING (CABG)X3, RIGHT AND LEFT SAPHENOUS VEIN HARVEST, MAMMARY TAKE  DOWN. MAMMARY TO LAD, SVG TO PD, SVG TO DISTAL CIRC. (N/A) TRANSESOPHAGEAL ECHOCARDIOGRAM (TEE) (N/A) -  CV- stable in SR  RESP- continue IS  RENAL- creatinine up slightly from yesterday, same as 2 days ago  Hypokalemia- supplement  ENDO- CBG 85-183 continue insulin  Gi- diabetic gastroparesis- better, able to eat without nausea or bloating  Continue cardiac rehab  LOS: 14 days    Melrose Nakayama 01/18/2018

## 2018-01-19 LAB — GLUCOSE, CAPILLARY
GLUCOSE-CAPILLARY: 159 mg/dL — AB (ref 70–99)
GLUCOSE-CAPILLARY: 172 mg/dL — AB (ref 70–99)
Glucose-Capillary: 196 mg/dL — ABNORMAL HIGH (ref 70–99)
Glucose-Capillary: 184 mg/dL — ABNORMAL HIGH (ref 70–99)

## 2018-01-19 LAB — BASIC METABOLIC PANEL
Anion gap: 6 (ref 5–15)
BUN: 19 mg/dL (ref 6–20)
CO2: 28 mmol/L (ref 22–32)
Calcium: 8.6 mg/dL — ABNORMAL LOW (ref 8.9–10.3)
Chloride: 102 mmol/L (ref 98–111)
Creatinine, Ser: 1.28 mg/dL — ABNORMAL HIGH (ref 0.44–1.00)
GFR calc Af Amer: 56 mL/min — ABNORMAL LOW (ref >60)
GFR calc non Af Amer: 48 mL/min — ABNORMAL LOW (ref >60)
Glucose, Bld: 109 mg/dL — ABNORMAL HIGH (ref 70–99)
Potassium: 4.1 mmol/L (ref 3.5–5.1)
Sodium: 136 mmol/L (ref 135–145)

## 2018-01-19 MED ORDER — INSULIN ASPART 100 UNIT/ML ~~LOC~~ SOLN
0.0000 [IU] | Freq: Three times a day (TID) | SUBCUTANEOUS | Status: DC
  Administered 2018-01-19 (×2): 3 [IU] via SUBCUTANEOUS

## 2018-01-19 MED ORDER — METOCLOPRAMIDE HCL 5 MG/ML IJ SOLN
5.0000 mg | Freq: Four times a day (QID) | INTRAMUSCULAR | Status: DC | PRN
Start: 2018-01-19 — End: 2018-01-25
  Administered 2018-01-19 – 2018-01-24 (×4): 5 mg via INTRAVENOUS
  Filled 2018-01-19 (×5): qty 2

## 2018-01-19 NOTE — Progress Notes (Signed)
5 Days Post-Op Procedure(s) (LRB): CORONARY ARTERY BYPASS GRAFTING (CABG)X3, RIGHT AND LEFT SAPHENOUS VEIN HARVEST, MAMMARY TAKE DOWN. MAMMARY TO LAD, SVG TO PD, SVG TO DISTAL CIRC. (N/A) TRANSESOPHAGEAL ECHOCARDIOGRAM (TEE) (N/A) Subjective: doesn't feel well this AM. C/o nausea, early satiety  Objective: Vital signs in last 24 hours: Temp:  [98 F (36.7 C)-98.6 F (37 C)] 98.2 F (36.8 C) (07/28 0815) Pulse Rate:  [76-91] 86 (07/28 0900) Cardiac Rhythm: Normal sinus rhythm (07/28 0900) Resp:  [9-31] 13 (07/28 0900) BP: (83-120)/(54-77) 112/61 (07/28 0900) SpO2:  [89 %-99 %] 93 % (07/28 0900) Weight:  [149 lb 4 oz (67.7 kg)] 149 lb 4 oz (67.7 kg) (07/28 0600)  Hemodynamic parameters for last 24 hours:    Intake/Output from previous day: 07/27 0701 - 07/28 0700 In: 976.2 [P.O.:890; I.V.:86.2] Out: 501 [Urine:500; Stool:1] Intake/Output this shift: Total I/O In: 120 [P.O.:120] Out: -   General appearance: cooperative, fatigued and no distress Neurologic: intact Heart: regular rate and rhythm Lungs: diminished breath sounds bibasilar Abdomen: normal findings: soft, non-tender  Lab Results: Recent Labs    01/17/18 0500 01/18/18 0312  WBC 11.0* 7.9  HGB 10.4* 10.5*  HCT 34.2* 34.2*  PLT 226 196   BMET:  Recent Labs    01/18/18 0312 01/19/18 0330  NA 140 136  K 3.4* 4.1  CL 103 102  CO2 28 28  GLUCOSE 45* 109*  BUN 16 19  CREATININE 1.35* 1.28*  CALCIUM 8.7* 8.6*    PT/INR: No results for input(s): LABPROT, INR in the last 72 hours. ABG    Component Value Date/Time   PHART 7.348 (L) 01/14/2018 2211   HCO3 20.2 01/14/2018 2211   TCO2 22 01/15/2018 1703   ACIDBASEDEF 5.0 (H) 01/14/2018 2211   O2SAT 65.6 01/17/2018 0835   CBG (last 3)  Recent Labs    01/18/18 2001 01/18/18 2337 01/19/18 0813  GLUCAP 134* 77 159*    Assessment/Plan: S/P Procedure(s) (LRB): CORONARY ARTERY BYPASS GRAFTING (CABG)X3, RIGHT AND LEFT SAPHENOUS VEIN HARVEST, MAMMARY  TAKE DOWN. MAMMARY TO LAD, SVG TO PD, SVG TO DISTAL CIRC. (N/A) TRANSESOPHAGEAL ECHOCARDIOGRAM (TEE) (N/A) -CV- stable, in SR  RESP- continue IS  RENAL- creatinine down slightly , lytes OK  GI- primary issue due to diabetic gastroparesis-   On pyridostigmine, responded well to reglan previously- will try again this Am  + BM last night  ENDO- CBG better- change to [facility] and HS   LOS: 15 days    Melrose Nakayama 01/19/2018

## 2018-01-19 NOTE — NC FL2 (Signed)
[place] LEVEL OF CARE SCREENING TOOL     IDENTIFICATION  Patient Name: [name] Birthdate: 02/10/1966 Sex: female Admission Date (Current Location): 01/04/2018  [facility] and [place] Number:  Herbalist and Address:  [facility] [place] [address] [place], [place] [address]      Provider Number: [id number]  Attending Physician Name and Address:  Grace Isaac, MD  Relative Name and Phone Number:       Current Level of Care: Hospital Recommended Level of Care: [place] Prior Approval Number:    Date Approved/Denied:   PASRR Number: [id number]  Discharge Plan: SNF    Current Diagnoses: Patient Active Problem List   Diagnosis Date Noted  . S/P CABG x 3 01/14/2018  . CAD (coronary artery disease), native coronary artery 01/11/2018  . Non-ST elevation (NSTEMI) myocardial infarction ([place])   . MDD (major depressive disorder), single episode, severe , no psychosis ([place])   . DKA (diabetic ketoacidoses) ([place]) 06/16/2017  . Tardive dyskinesia   . DKA, type 1 ([place]) 03/18/2016  . Noncompliance with treatment plan 03/13/2016  . Essential hypertension 01/24/2016  . HLD (hyperlipidemia)   . GERD (gastroesophageal reflux disease)   . AKI (acute kidney injury) ([place])   . Anemia, iron deficiency   . Vitamin B12 deficiency 08/16/2015  . Diabetic gastroparesis ([place])   . Diabetic neuropathy, type I diabetes mellitus ([place]) 05/18/2015  . Anxiety and depression 05/18/2015    Orientation RESPIRATION BLADDER Height & Weight     Time, Situation, Place, Self  Normal Continent Weight: 149 lb 4 oz (67.7 kg) Height:  5\' 1"  (154.9 cm)  BEHAVIORAL SYMPTOMS/MOOD NEUROLOGICAL BOWEL NUTRITION STATUS      Continent Diet(Heart healthy/carb modified, thin liquids)  AMBULATORY STATUS COMMUNICATION OF NEEDS Skin   Limited Assist Verbally Surgical wounds(Closed incision, mid chest, right leg, no dressing. Closed incision, left  leg, no dressing, left open to air.)                       Personal Care Assistance Level of Assistance  Bathing, Feeding, Dressing Bathing Assistance: Limited assistance Feeding assistance: Independent Dressing Assistance: Limited assistance     Functional Limitations Info  Sight, Hearing, Speech Sight Info: Adequate Hearing Info: Adequate Speech Info: Adequate    SPECIAL CARE FACTORS FREQUENCY  PT (By licensed PT), OT (By licensed OT)     PT Frequency: 5x OT Frequency: 5x            Contractures Contractures Info: Not present    Additional Factors Info  Code Status, Allergies Code Status Info: Full CO[place] Allergies Info: Anesthetics, Amide, Penicillins, Buprenorphine Hcl, Encainide, Metoclopramide           Current Medications (01/19/2018):  This is the current hospital active medication list Current Facility-Administered Medications  Medication Dose Route Frequency Provider Last Rate Last Dose  . 0.45 % sodium chloride infusion   Intravenous Continuous PRN Barrett, Erin R, PA-C 20 mL/hr at 01/15/18 0600    . 0.9 %  sodium chloride infusion  250 mL Intravenous Continuous Barrett, Erin R, PA-C      . 0.9 %  sodium chloride infusion   Intravenous Continuous Barrett, Erin R, PA-C   Stopped at 01/18/18 1115  . acetaminophen (TYLENOL) tablet 1,000 mg  1,000 mg Oral Q6H Barrett, Erin R, PA-C   1,000 mg at 01/19/18 1239   Or  . acetaminophen (TYLENOL) solution 1,000 mg  1,000 mg Per Tube Q6H Barrett, Erin R, PA-C      . aspirin EC tablet 325 mg  325 mg Oral Daily Barrett, Erin R, PA-C   325 mg at 01/19/18 1004   Or  . aspirin chewable tablet 324 mg  324 mg Per Tube Daily Barrett, Erin R, PA-C      . atorvastatin (LIPITOR) tablet 80 mg  80 mg Oral q1800 Barrett, Erin R, PA-C   80 mg at 01/18/18 1801  . bisacodyl (DULCOLAX) suppository 10 mg  10 mg Rectal Daily Barrett, Erin R, PA-C      . busPIRone (BUSPAR) tablet 10 mg  10 mg Oral TID Barrett, Erin R, PA-C   10 mg at  01/19/18 1003  . Chlorhexidine Gluconate Cloth 2 % PADS 6 each  6 each Topical Daily Grace Isaac, MD   6 each at 01/18/18 1100  . enoxaparin (LOVENOX) injection 30 mg  30 mg Subcutaneous QHS Grace Isaac, MD   30 mg at 01/18/18 2203  . fluticasone (FLONASE) 50 MCG/ACT nasal spray 2 spray  2 spray Each Nare Daily Barrett, Erin R, PA-C      . insulin aspart (novoLOG) injection 0-15 Units  0-15 Units Subcutaneous TID WC Mel[place] Nakayama, MD   3 Units at 01/19/18 1240  . insulin detemir (LEVEMIR) injection 15 Units  15 Units Subcutaneous Daily Grace Isaac, MD   15 Units at 01/19/18 1004  . lactated ringers infusion 500 mL  500 mL Intravenous Once PRN Barrett, Erin R, PA-C      . lactated ringers infusion   Intravenous Continuous Barrett, Erin R, PA-C   Stopped at 01/15/18 0000  . lactated ringers infusion   Intravenous Continuous Barrett, Erin R, PA-C 20 mL/hr at 01/17/18 1900 20 mL/hr at 01/17/18 1900  . MEDLINE mouth rinse  15 mL Mouth Rinse BID Grace Isaac, MD   15 mL at 01/17/18 1252  . metoCLOPramide (REGLAN) injection 5 mg  5 mg Intravenous Q6H PRN [place] Nakayama, MD   5 mg at 01/19/18 1001  . metoprolol tartrate (LOPRESSOR) tablet 12.5 mg  12.5 mg Oral BID Barrett, Erin R, PA-C   12.5 mg at 01/19/18 1002   Or  . metoprolol tartrate (LOPRESSOR) 25 mg/10 mL oral suspension 12.5 mg  12.5 mg Per Tube BID Barrett, Erin R, PA-C      . metoprolol tartrate (LOPRESSOR) injection 2.5-5 mg  2.5-5 mg Intravenous Q2H PRN Barrett, Erin R, PA-C   5 mg at 01/16/18 0005  . midazolam (VERSED) injection 2 mg  2 mg Intravenous Q1H PRN Barrett, Erin R, PA-C      . morphine 2 MG/ML injection 2-5 mg  2-5 mg Intravenous Q1H PRN Barrett, Erin R, PA-C   2 mg at 01/16/18 2036  . nitroGLYCERIN 50 mg in dextrose 5 % 250 mL (0.2 mg/mL) infusion  0-100 mcg/min Intravenous Titrated Barrett, Erin R, PA-C   Stopped at 01/15/18 0631  . ondansetron (ZOFRAN) injection 4 mg  4 mg Intravenous  Q6H PRN Barrett, Erin R, PA-C   4 mg at 01/19/18 0733  . oxyCODONE (Oxy IR/ROXICODONE) immediate release tablet 5-10 mg  5-10 mg Oral Q3H PRN Barrett, Erin R, PA-C      . pantoprazole (PROTONIX) EC tablet 40 mg  40 mg Oral Daily Barrett, Erin R, PA-C   40 mg at 01/19/18 1002  . phenylephrine (NEOSYNEPHRINE) 20-0.9 MG/250ML-% infusion  0-100 mcg/min Intravenous Titrated Hammons, Theone Murdoch, RPH  Stopped at 01/15/18 1237  . pregabalin (LYRICA) capsule 150 mg  150 mg Oral Daily Barrett, Erin R, PA-C   150 mg at 01/19/18 1002  . promethazine (PHENERGAN) injection 6.25 mg  6.25 mg Intravenous Q6H PRN Grace Isaac, MD   6.25 mg at 01/16/18 1725  . pyridostigmine (MESTINON) tablet 30 mg  30 mg Oral TID WC Barrett, Erin R, PA-C   30 mg at 01/18/18 1630  . sodium chloride flush (NS) 0.9 % injection 10 mL  10 mL Intracatheter Q12H Grace Isaac, MD   10 mL at 01/19/18 1012  . sodium chloride flush (NS) 0.9 % injection 10 mL  10 mL Intracatheter PRN Grace Isaac, MD      . traMADol Veatrice Bourbon) tablet 50-100 mg  50-100 mg Oral Q4H PRN Barrett, Erin R, PA-C   100 mg at 01/18/18 2203     Discharge Medications: Please see discharge summary for a list of discharge medications.  Relevant Imaging Results:  Relevant Lab Results:   Additional Information SS#: [id number]  Eileen Stanford, LCSW

## 2018-01-19 NOTE — Clinical Social Work Note (Signed)
Clinical Social Work Assessment  Patient Details  Name: [name] MRN: [id number] Date of Birth: 01/15/1966  Date of referral:  01/19/18               Reason for consult:  Facility Placement                Permission sought to share information with:  Family Supports Permission granted to share information::  Yes, Verbal Permission Granted  Name::     International aid/development worker::     Relationship::  Sister  Contact Information:  [phone]  Housing/Transportation Living arrangements for the past 2 months:  Single Family Home Source of Information:  Patient Patient Interpreter Needed:  None Criminal Activity/Legal Involvement Pertinent to Current Situation/Hospitalization:  No - Comment as needed Significant Relationships:  Siblings Lives with:  Siblings Do you feel safe going back to the place where you live?  No Need for family participation in patient care:  No (Coment)  Care giving concerns:  Pt is alert and oriented. Pt lives with sister.   Social Worker assessment / plan: CSW spoke with pt at bedside--pt's sister present. Pt is agreeable to SNF and said she would need to go because her sister works all day. Pt did not have a specific facility in mind however was agreeable to general [facility] fax out. CSW provided pt with a list to look at in the meantime. CSW will follow up tomorrow with bed offers.  Employment status:    Insurance information:  Medicare PT Recommendations:  [place] / Referral to community resources:  [place]  Patient/Family's Response to care:  Pt verbalized understanding of CSW role and expressed appreciation for support. Pt denies any concern regarding pt care at this time.   Patient/Family's Understanding of and Emotional Response to Diagnosis, Current Treatment, and Prognosis:  Pt understanding and realistic regarding physical limitations. Pt understands the need for SNF placement at d/c. Pt agreeable to SNF  placement at d/c, at this time. Pt's responses emotionally appropriate during conversation with CSW. Pt denies any concern regarding treatment plan at this time. CSW will continue to provide support and facilitate d/c needs.   Emotional Assessment Appearance:  Appears stated age Attitude/Demeanor/Rapport:  (Patient was appropriate) Affect (typically observed):  Accepting, Appropriate, Calm Orientation:  Oriented to Self, Oriented to Place, Oriented to  Time, Oriented to Situation Alcohol / Substance use:  Not Applicable Psych involvement (Current and /or in the community):  No (Comment)  Discharge Needs  Concerns to be addressed:  Care Coordination, Basic Needs Readmission within the last 30 days:  Yes Current discharge risk:  Dependent with Mobility Barriers to Discharge:  Continued Medical Work up   [employer], LCSW 01/19/2018, 3:52 PM

## 2018-01-19 NOTE — Progress Notes (Signed)
      [address]       [place] [address]             [phone]      Feels better this PM Nausea improved BP (!) 108/57   Pulse 85   Temp 97.9 F (36.6 C) (Oral)   Resp (!) 33   Ht 5\' 1"  (1.549 m)   Wt 149 lb 4 oz (67.7 kg)   SpO2 100%   BMI 28.20 kg/m   Intake/Output Summary (Last 24 hours) at 01/19/2018 1813 Last data filed at 01/19/2018 1800 Gross per 24 hour  Intake 778.75 ml  Output 576 ml  Net 202.75 ml   Stable day  Remo Lipps C. Roxan Hockey, MD [facility] [phone]

## 2018-01-20 DIAGNOSIS — E0810 Diabetes mellitus due to underlying condition with ketoacidosis without coma: Secondary | ICD-10-CM

## 2018-01-20 LAB — CBC
HCT: 33.3 % — ABNORMAL LOW (ref 36.0–46.0)
Hemoglobin: 10.2 g/dL — ABNORMAL LOW (ref 12.0–15.0)
MCH: 28.7 pg (ref 26.0–34.0)
MCHC: 30.6 g/dL (ref 30.0–36.0)
MCV: 93.8 fL (ref 78.0–100.0)
PLATELETS: 249 10*3/uL (ref 150–400)
RBC: 3.55 MIL/uL — ABNORMAL LOW (ref 3.87–5.11)
RDW: 16.8 % — AB (ref 11.5–15.5)
WBC: 6.6 10*3/uL (ref 4.0–10.5)

## 2018-01-20 LAB — GLUCOSE, CAPILLARY
GLUCOSE-CAPILLARY: 176 mg/dL — AB (ref 70–99)
Glucose-Capillary: 174 mg/dL — ABNORMAL HIGH (ref 70–99)
Glucose-Capillary: 79 mg/dL (ref 70–99)
Glucose-Capillary: 111 mg/dL — ABNORMAL HIGH (ref 70–99)

## 2018-01-20 LAB — COMPREHENSIVE METABOLIC PANEL
ALBUMIN: 2.4 g/dL — AB (ref 3.5–5.0)
ALK PHOS: 428 U/L — AB (ref 38–126)
ALT: 67 U/L — ABNORMAL HIGH (ref 0–44)
ANION GAP: 6 (ref 5–15)
AST: 97 U/L — ABNORMAL HIGH (ref 15–41)
BUN: 20 mg/dL (ref 6–20)
CALCIUM: 8.5 mg/dL — AB (ref 8.9–10.3)
CHLORIDE: 101 mmol/L (ref 98–111)
CO2: 28 mmol/L (ref 22–32)
Creatinine, Ser: 1.24 mg/dL — ABNORMAL HIGH (ref 0.44–1.00)
GFR calc non Af Amer: 50 mL/min — ABNORMAL LOW (ref >60)
GFR, EST AFRICAN AMERICAN: 58 mL/min — AB (ref >60)
GLUCOSE: 104 mg/dL — AB (ref 70–99)
POTASSIUM: 4.1 mmol/L (ref 3.5–5.1)
SODIUM: 135 mmol/L (ref 135–145)
TOTAL PROTEIN: 5.7 g/dL — AB (ref 6.5–8.1)
Total Bilirubin: 1.2 mg/dL (ref 0.3–1.2)

## 2018-01-20 MED ORDER — FUROSEMIDE 20 MG PO TABS
20.0000 mg | ORAL_TABLET | Freq: Every day | ORAL | Status: DC
  Administered 2018-01-20 – 2018-01-25 (×6): 20 mg via ORAL
  Filled 2018-01-20 (×6): qty 1

## 2018-01-20 MED ORDER — INSULIN ASPART 100 UNIT/ML ~~LOC~~ SOLN
0.0000 [IU] | Freq: Three times a day (TID) | SUBCUTANEOUS | Status: DC
  Administered 2018-01-20: 4 [IU] via SUBCUTANEOUS
  Administered 2018-01-21: 8 [IU] via SUBCUTANEOUS
  Administered 2018-01-21: 2 [IU] via SUBCUTANEOUS
  Administered 2018-01-21 (×2): 8 [IU] via SUBCUTANEOUS
  Administered 2018-01-22: 16 [IU] via SUBCUTANEOUS
  Administered 2018-01-22 (×2): 4 [IU] via SUBCUTANEOUS
  Administered 2018-01-23: 16 [IU] via SUBCUTANEOUS
  Administered 2018-01-23: 12 [IU] via SUBCUTANEOUS
  Administered 2018-01-23: 4 [IU] via SUBCUTANEOUS
  Administered 2018-01-23: 12 [IU] via SUBCUTANEOUS
  Administered 2018-01-24: 2 [IU] via SUBCUTANEOUS
  Administered 2018-01-24: 12 [IU] via SUBCUTANEOUS
  Administered 2018-01-24: 2 [IU] via SUBCUTANEOUS
  Administered 2018-01-24: 4 [IU] via SUBCUTANEOUS
  Administered 2018-01-25: 2 [IU] via SUBCUTANEOUS

## 2018-01-20 MED ORDER — ASPIRIN EC 325 MG PO TBEC
325.0000 mg | DELAYED_RELEASE_TABLET | Freq: Every day | ORAL | Status: DC
  Administered 2018-01-20 – 2018-01-25 (×6): 325 mg via ORAL
  Filled 2018-01-20 (×6): qty 1

## 2018-01-20 MED ORDER — ONDANSETRON 4 MG PO TBDP
4.0000 mg | ORAL_TABLET | Freq: Three times a day (TID) | ORAL | Status: DC | PRN
  Administered 2018-01-20 – 2018-01-24 (×3): 4 mg via ORAL
  Filled 2018-01-20 (×3): qty 1

## 2018-01-20 MED ORDER — GUAIFENESIN ER 600 MG PO TB12: 600.0000 mg | ORAL_TABLET | Freq: Two times a day (BID) | ORAL | Status: DC | PRN

## 2018-01-20 MED ORDER — BISACODYL 5 MG PO TBEC
10.0000 mg | DELAYED_RELEASE_TABLET | Freq: Every day | ORAL | Status: DC | PRN
  Administered 2018-01-22: 10 mg via ORAL
  Filled 2018-01-20: qty 2

## 2018-01-20 MED ORDER — SODIUM CHLORIDE 0.9% FLUSH: 3.0000 mL | INTRAVENOUS | Status: DC | PRN

## 2018-01-20 MED ORDER — POLYETHYLENE GLYCOL 3350 17 G PO PACK
17.0000 g | PACK | Freq: Every day | ORAL | Status: DC | PRN
  Administered 2018-01-22: 17 g via ORAL
  Filled 2018-01-20: qty 1

## 2018-01-20 MED ORDER — POTASSIUM CHLORIDE CRYS ER 10 MEQ PO TBCR
10.0000 meq | EXTENDED_RELEASE_TABLET | Freq: Two times a day (BID) | ORAL | Status: DC
  Administered 2018-01-20 – 2018-01-25 (×11): 10 meq via ORAL
  Filled 2018-01-20 (×11): qty 1

## 2018-01-20 MED ORDER — PANTOPRAZOLE SODIUM 40 MG PO TBEC
40.0000 mg | DELAYED_RELEASE_TABLET | Freq: Every day | ORAL | Status: DC
  Administered 2018-01-20 – 2018-01-25 (×6): 40 mg via ORAL
  Filled 2018-01-20 (×6): qty 1

## 2018-01-20 MED ORDER — ACETAMINOPHEN 325 MG PO TABS
650.0000 mg | ORAL_TABLET | Freq: Four times a day (QID) | ORAL | Status: DC | PRN
  Administered 2018-01-20 – 2018-01-25 (×13): 650 mg via ORAL
  Filled 2018-01-20 (×14): qty 2

## 2018-01-20 MED ORDER — SODIUM CHLORIDE 0.9 % IV SOLN: 250.0000 mL | INTRAVENOUS | Status: DC | PRN

## 2018-01-20 MED ORDER — SODIUM CHLORIDE 0.9% FLUSH
3.0000 mL | Freq: Two times a day (BID) | INTRAVENOUS | Status: DC
  Administered 2018-01-20 – 2018-01-24 (×6): 3 mL via INTRAVENOUS

## 2018-01-20 MED ORDER — MOVING RIGHT ALONG BOOK
Freq: Once | Status: AC
  Administered 2018-01-20: 09:00:00
  Filled 2018-01-20: qty 1

## 2018-01-20 MED ORDER — ALUM & MAG HYDROXIDE-SIMETH 200-200-20 MG/5ML PO SUSP
15.0000 mL | ORAL | Status: DC | PRN
  Filled 2018-01-20: qty 30

## 2018-01-20 MED ORDER — DOCUSATE SODIUM 100 MG PO CAPS
200.0000 mg | ORAL_CAPSULE | Freq: Every day | ORAL | Status: DC
  Administered 2018-01-22 – 2018-01-25 (×3): 200 mg via ORAL
  Filled 2018-01-20 (×4): qty 2

## 2018-01-20 MED ORDER — BISACODYL 10 MG RE SUPP: 10.0000 mg | Freq: Every day | RECTAL | Status: DC | PRN

## 2018-01-20 MED ORDER — METOPROLOL TARTRATE 12.5 MG HALF TABLET
12.5000 mg | ORAL_TABLET | Freq: Two times a day (BID) | ORAL | Status: DC
  Administered 2018-01-20 – 2018-01-25 (×11): 12.5 mg via ORAL
  Filled 2018-01-20 (×11): qty 1

## 2018-01-20 NOTE — Progress Notes (Signed)
EVENING ROUNDS NOTE :     [address]       [place] [address]             [phone]                 6 Days Post-Op Procedure(s) (LRB): CORONARY ARTERY BYPASS GRAFTING (CABG)X3, RIGHT AND LEFT SAPHENOUS VEIN HARVEST, MAMMARY TAKE DOWN. MAMMARY TO LAD, SVG TO PD, SVG TO DISTAL CIRC. (N/A) TRANSESOPHAGEAL ECHOCARDIOGRAM (TEE) (N/A)  Total Length of Stay:  LOS: 16 days  BP 109/63   Pulse 98   Temp 97.9 F (36.6 C) (Oral)   Resp 12   Ht 5\' 1"  (1.549 m)   Wt 65.4 kg (144 lb 2.9 oz)   SpO2 94%   BMI 27.24 kg/m   .Intake/Output      07/28 0701 - 07/29 0700 07/29 0701 - 07/30 0700   P.O. 600 120   I.V. (mL/kg) 0 (0)    Total Intake(mL/kg) 600 (9.2) 120 (1.8)   Urine (mL/kg/hr) 425 (0.3) 195 (0.3)   Emesis/NG output  15   Stool     Total Output 425 210   Net +175 -90          . sodium chloride       Lab Results  Component Value Date   WBC 6.6 01/20/2018   HGB 10.2 (L) 01/20/2018   HCT 33.3 (L) 01/20/2018   PLT 249 01/20/2018   GLUCOSE 104 (H) 01/20/2018   CHOL 121 01/07/2018   TRIG 101 01/07/2018   HDL 47 01/07/2018   LDLCALC 54 01/07/2018   ALT 67 (H) 01/20/2018   AST 97 (H) 01/20/2018   NA 135 01/20/2018   K 4.1 01/20/2018   CL 101 01/20/2018   CREATININE 1.24 (H) 01/20/2018   BUN 20 01/20/2018   CO2 28 01/20/2018   TSH 0.632 03/25/2017   INR 1.33 01/14/2018   HGBA1C 8.6 (H) 01/08/2018   MICROALBUR 24.6 09/04/2016   Transferring to 4e   Grace Isaac MD  Beeper [phone] Office [phone] 01/20/2018 6:50 PM

## 2018-01-20 NOTE — Progress Notes (Signed)
Patient ID: [name], female   DOB: Nov 11, 1965, 52 y.o.   MRN: [id number] TCTS DAILY ICU PROGRESS NOTE                   [place]            [place] [address]          [phone]   6 Days Post-Op Procedure(s) (LRB): CORONARY ARTERY BYPASS GRAFTING (CABG)X3, RIGHT AND LEFT SAPHENOUS VEIN HARVEST, MAMMARY TAKE DOWN. MAMMARY TO LAD, SVG TO PD, SVG TO DISTAL CIRC. (N/A) TRANSESOPHAGEAL ECHOCARDIOGRAM (TEE) (N/A)  Total Length of Stay:  LOS: 16 days   Subjective: Patient feels better this morning up in the chair eating breakfast without nausea.  Discussing where she could go for rehab  Objective: Vital signs in last 24 hours: Temp:  [97.9 F (36.6 C)-98.4 F (36.9 C)] 98.4 F (36.9 C) (07/29 0400) Pulse Rate:  [80-88] 86 (07/29 0700) Cardiac Rhythm: Normal sinus rhythm (07/28 1916) Resp:  [12-40] 17 (07/29 0700) BP: (102-125)/(53-76) 116/65 (07/29 0700) SpO2:  [93 %-100 %] 96 % (07/29 0700) Weight:  [144 lb 2.9 oz (65.4 kg)] 144 lb 2.9 oz (65.4 kg) (07/29 0400)  Filed Weights   01/18/18 0600 01/19/18 0600 01/20/18 0400  Weight: 146 lb 2.6 oz (66.3 kg) 149 lb 4 oz (67.7 kg) 144 lb 2.9 oz (65.4 kg)    Weight change: -5 lb 1.1 oz (-2.3 kg)   Hemodynamic parameters for last 24 hours:    Intake/Output from previous day: 07/28 0701 - 07/29 0700 In: 600 [P.O.:600] Out: 425 [Urine:425]  Intake/Output this shift: No intake/output data recorded.  Current Meds: Scheduled Meds: . aspirin EC  325 mg Oral Daily   Or  . aspirin  324 mg Per Tube Daily  . atorvastatin  80 mg Oral q1800  . bisacodyl  10 mg Rectal Daily  . busPIRone  10 mg Oral TID  . Chlorhexidine Gluconate Cloth  6 each Topical Daily  . enoxaparin (LOVENOX) injection  30 mg Subcutaneous QHS  . fluticasone  2 spray Each Nare Daily  . insulin aspart  0-15 Units Subcutaneous TID WC  . insulin detemir  15 Units Subcutaneous Daily  . mouth rinse  15 mL Mouth Rinse BID  . metoprolol  tartrate  12.5 mg Oral BID   Or  . metoprolol tartrate  12.5 mg Per Tube BID  . pantoprazole  40 mg Oral Daily  . pregabalin  150 mg Oral Daily  . pyridostigmine  30 mg Oral TID WC  . sodium chloride flush  10 mL Intracatheter Q12H   Continuous Infusions: . sodium chloride 20 mL/hr at 01/15/18 0600  . sodium chloride    . sodium chloride Stopped (01/18/18 1115)  . lactated ringers    . lactated ringers Stopped (01/15/18 0000)  . lactated ringers 20 mL/hr (01/17/18 1900)  . nitroGLYCERIN Stopped (01/15/18 0631)  . phenylephrine (NEO-SYNEPHRINE) Adult infusion Stopped (01/15/18 1237)   PRN Meds:.sodium chloride, lactated ringers, metoCLOPramide (REGLAN) injection, metoprolol tartrate, midazolam, morphine injection, ondansetron (ZOFRAN) IV, oxyCODONE, promethazine, sodium chloride flush, traMADol  General appearance: alert, cooperative and no distress Neurologic: intact Heart: regular rate and rhythm, S1, S2 normal, no murmur, click, rub or gallop Lungs: clear to auscultation bilaterally Abdomen: soft, non-tender; bowel sounds normal; no masses,  no organomegaly Extremities: extremities normal, atraumatic, no cyanosis or edema and Homans sign is negative, no sign of DVT Wound: Incisions intact  Lab Results: CBC: Recent Labs  01/18/18 0312 01/20/18 0410  WBC 7.9 6.6  HGB 10.5* 10.2*  HCT 34.2* 33.3*  PLT 196 249   BMET:  Recent Labs    01/19/18 0330 01/20/18 0410  NA 136 135  K 4.1 4.1  CL 102 101  CO2 28 28  GLUCOSE 109* 104*  BUN 19 20  CREATININE 1.28* 1.24*  CALCIUM 8.6* 8.5*    CMET: Lab Results  Component Value Date   WBC 6.6 01/20/2018   HGB 10.2 (L) 01/20/2018   HCT 33.3 (L) 01/20/2018   PLT 249 01/20/2018   GLUCOSE 104 (H) 01/20/2018   CHOL 121 01/07/2018   TRIG 101 01/07/2018   HDL 47 01/07/2018   LDLCALC 54 01/07/2018   ALT 67 (H) 01/20/2018   AST 97 (H) 01/20/2018   NA 135 01/20/2018   K 4.1 01/20/2018   CL 101 01/20/2018   CREATININE  1.24 (H) 01/20/2018   BUN 20 01/20/2018   CO2 28 01/20/2018   TSH 0.632 03/25/2017   INR 1.33 01/14/2018   HGBA1C 8.6 (H) 01/08/2018   MICROALBUR 24.6 09/04/2016      PT/INR: No results for input(s): LABPROT, INR in the last 72 hours. Radiology: No results found.   Assessment/Plan: S/P Procedure(s) (LRB): CORONARY ARTERY BYPASS GRAFTING (CABG)X3, RIGHT AND LEFT SAPHENOUS VEIN HARVEST, MAMMARY TAKE DOWN. MAMMARY TO LAD, SVG TO PD, SVG TO DISTAL CIRC. (N/A) TRANSESOPHAGEAL ECHOCARDIOGRAM (TEE) (N/A) Mobilize Diuresis Plan for transfer to step-down: see transfer orders Renal function remains stable Plan transfer to rehab in the next 2 days  Grace Isaac 01/20/2018 7:23 AM

## 2018-01-20 NOTE — Progress Notes (Signed)
Physical Therapy Treatment Patient Details Name: [name] MRN: [id number] DOB: 1965-10-15 Today's Date: 01/20/2018    History of Present Illness Pt is a 52 y.o. female with h/o uncontrolled DM type 1, admitted 01/04/18 with DKA; found to have NSTEMI. Cardiac cath 7/16 showed multivessel severe CAD. CABGx3 on 7/23. PMH includes HTN, DM, anxiety, depression.    PT Comments    Pt very pleasant, smiling in chair on arrival. Pt with greatly improved gait and function with increased awareness and adherence to precautions. Pt educated for all transfers, gait and HEp and would benefit from attempting stairs next session. Pt reports she still plans on SNF as she does not have 24hr care at home.   HR 90-104 SpO2 94% on RA Pre bp 125/71, post 133/67    Follow Up Recommendations  SNF;Supervision for mobility/OOB     Equipment Recommendations  Rolling walker with 5" wheels    Recommendations for Other Services       Precautions / Restrictions Precautions Precautions: Fall;Sternal Restrictions Weight Bearing Restrictions: Yes(sternal precautions)    Mobility  Bed Mobility               General bed mobility comments: Pt up in recliner upon my arrival  Transfers Overall transfer level: Needs assistance   Transfers: Sit to/from Stand Sit to Stand: Supervision         General transfer comment: cues for hand placement from chair and toilet  Ambulation/Gait Ambulation/Gait assistance: Min guard Gait Distance (Feet): 425 Feet Assistive device: 1 person hand held assist Gait Pattern/deviations: Step-through pattern;Decreased stride length   Gait velocity interpretation: >2.62 ft/sec, indicative of community ambulatory General Gait Details: pt with very light HHA as reassurance for pt but no support placed through hand. Pt able to walk unit with one standing rest and then to bathroom prior to return to chair. Pt nervous about walking without RW but performed well and  requires reassurance and encouragement to maximize function   Stairs             Wheelchair Mobility    Modified Rankin (Stroke Patients Only)       Balance Overall balance assessment: Needs assistance   Sitting balance-Leahy Scale: Good       Standing balance-Leahy Scale: Fair                              Cognition Arousal/Alertness: Awake/alert Behavior During Therapy: WFL for tasks assessed/performed Overall Cognitive Status: Within Functional Limits for tasks assessed                                 General Comments: pt able to recall all sternal precautions except lifting and educated for all      Exercises General Exercises - Lower Extremity Long Arc Quad: AROM;Seated;Both;15 reps Hip Flexion/Marching: AROM;Seated;Both;15 reps    General Comments        Pertinent Vitals/Pain Pain Assessment: No/denies pain    Home Living                      Prior Function            PT Goals (current goals can now be found in the care plan section) Progress towards PT goals: Progressing toward goals    Frequency           PT Plan Current plan  remains appropriate    Co-evaluation              AM-PAC PT "6 Clicks" Daily Activity  Outcome Measure  Difficulty turning over in bed (including adjusting bedclothes, sheets and blankets)?: A Little Difficulty moving from lying on back to sitting on the side of the bed? : A Lot Difficulty sitting down on and standing up from a chair with arms (e.g., wheelchair, bedside commode, etc,.)?: A Little Help needed moving to and from a bed to chair (including a wheelchair)?: A Little Help needed walking in hospital room?: A Little Help needed climbing 3-5 steps with a railing? : A Little 6 Click Score: 17    End of Session Equipment Utilized During Treatment: Gait belt Activity Tolerance: Patient tolerated treatment well Patient left: in chair;with call bell/phone within  reach Nurse Communication: Mobility status PT Visit Diagnosis: Muscle weakness (generalized) (M62.81);Other abnormalities of gait and mobility (R26.89)     Time: 0051-1021 PT Time Calculation (min) (ACUTE ONLY): 19 min  Charges:  $Gait Training: 8-22 mins                     Elwyn Reach, [place]    [place] 01/20/2018, 10:52 AM

## 2018-01-20 NOTE — Progress Notes (Signed)
CSW provided pt with list of current SNF options to review- potentially interested in [facility] but not decided at this time  Jorge Ny, [place] Social Worker [phone]

## 2018-01-21 ENCOUNTER — Inpatient Hospital Stay (HOSPITAL_COMMUNITY): Payer: Medicare Other

## 2018-01-21 LAB — GLUCOSE, CAPILLARY
GLUCOSE-CAPILLARY: 159 mg/dL — AB (ref 70–99)
GLUCOSE-CAPILLARY: 160 mg/dL — AB (ref 70–99)
GLUCOSE-CAPILLARY: 215 mg/dL — AB (ref 70–99)
Glucose-Capillary: 104 mg/dL — ABNORMAL HIGH (ref 70–99)
Glucose-Capillary: 224 mg/dL — ABNORMAL HIGH (ref 70–99)
Glucose-Capillary: 218 mg/dL — ABNORMAL HIGH (ref 70–99)

## 2018-01-21 LAB — CBC
HCT: 33.3 % — ABNORMAL LOW (ref 36.0–46.0)
Hemoglobin: 10.3 g/dL — ABNORMAL LOW (ref 12.0–15.0)
MCH: 29.1 pg (ref 26.0–34.0)
MCHC: 30.9 g/dL (ref 30.0–36.0)
MCV: 94.1 fL (ref 78.0–100.0)
Platelets: 304 10*3/uL (ref 150–400)
RBC: 3.54 MIL/uL — ABNORMAL LOW (ref 3.87–5.11)
RDW: 16.8 % — ABNORMAL HIGH (ref 11.5–15.5)
WBC: 7.9 10*3/uL (ref 4.0–10.5)

## 2018-01-21 LAB — BASIC METABOLIC PANEL
Anion gap: 8 (ref 5–15)
BUN: 18 mg/dL (ref 6–20)
CO2: 26 mmol/L (ref 22–32)
Calcium: 8.6 mg/dL — ABNORMAL LOW (ref 8.9–10.3)
Chloride: 101 mmol/L (ref 98–111)
Creatinine, Ser: 1.19 mg/dL — ABNORMAL HIGH (ref 0.44–1.00)
GFR calc Af Amer: >60 mL/min (ref >60)
GFR calc non Af Amer: 52 mL/min — ABNORMAL LOW (ref >60)
Glucose, Bld: 143 mg/dL — ABNORMAL HIGH (ref 70–99)
Potassium: 4.1 mmol/L (ref 3.5–5.1)
Sodium: 135 mmol/L (ref 135–145)

## 2018-01-21 NOTE — Progress Notes (Signed)
Occupational Therapy Treatment Patient Details Name: [name] MRN: [id number] DOB: April 30, 1966 Today's Date: 01/21/2018    History of present illness Pt is a 52 y.o. female with h/o uncontrolled DM type 1, admitted 01/04/18 with DKA; found to have NSTEMI. Cardiac cath 7/16 showed multivessel severe CAD. CABGx3 on 7/23. PMH includes HTN, DM, anxiety, depression.   OT comments  Pt progressing towards acute OT goals. Focus of session was toilet transfer, pericare, and grooming task standing at sink. Continued education on strategies to maintain sternal precautions during ADLs. D/c plan remains appropriate.    Follow Up Recommendations  SNF;Supervision/Assistance - 24 hour    Equipment Recommendations  None recommended by OT    Recommendations for Other Services      Precautions / Restrictions Precautions Precautions: Fall;Sternal Restrictions Weight Bearing Restrictions: Yes RUE Weight Bearing: (sternal precautions) LUE Weight Bearing: (sternal precautions)       Mobility Bed Mobility               General bed mobility comments: Pt up in recliner upon my arrival  Transfers Overall transfer level: Needs assistance Equipment used: None Transfers: Sit to/from Stand Sit to Stand: Min guard;Min assist         General transfer comment: min guard to light min A to steady. to/from recliner and 3n1 over toilet    Balance Overall balance assessment: Needs assistance Sitting-balance support: Feet supported;No upper extremity supported Sitting balance-Leahy Scale: Good     Standing balance support: No upper extremity supported Standing balance-Leahy Scale: Fair                             ADL either performed or assessed with clinical judgement   ADL Overall ADL's : Needs assistance/impaired     Grooming: Wash/dry hands;Min guard;Standing                   Toilet Transfer: Min guard;Minimal assistance;Ambulation(3n1 over toilet) Toilet  Transfer Details (indicate cue type and reason): steadying assist Toileting- Clothing Manipulation and Hygiene: Minimal assistance;Sit to/from stand Toileting - Clothing Manipulation Details (indicate cue type and reason): min A sit<>stand. steadying/clothing management assist duringanterior pericare.      Functional mobility during ADLs: Min guard General ADL Comments: Pt completed toilet transfer (walked to/from bathroom), pericare and grooming task in standing position. Cues for precautions at times (reaching over for paper towels, educated on walking to in front of dispenser).     Vision       Perception     Praxis      Cognition Arousal/Alertness: Awake/alert Behavior During Therapy: WFL for tasks assessed/performed Overall Cognitive Status: Within Functional Limits for tasks assessed                                          Exercises     Shoulder Instructions       General Comments      Pertinent Vitals/ Pain       Pain Assessment: Faces Faces Pain Scale: Hurts little more Pain Location: chest Pain Descriptors / Indicators: Sore Pain Intervention(s): Limited activity within patient's tolerance;Monitored during session;Repositioned;RN gave pain meds during session  Home Living  Prior Functioning/Environment              Frequency  Min 2X/week        Progress Toward Goals  OT Goals(current goals can now be found in the care plan section)  Progress towards OT goals: Progressing toward goals  Acute Rehab OT Goals Patient Stated Goal: to go to rehab then home OT Goal Formulation: With patient Time For Goal Achievement: 01/31/18 Potential to Achieve Goals: Good ADL Goals Pt Will Perform Grooming: with supervision;with set-up;standing Pt Will Perform Upper Body Bathing: with set-up;with supervision;sitting Pt Will Perform Lower Body Bathing: with min assist Pt Will Perform  Upper Body Dressing: with supervision;with set-up;sitting Pt Will Perform Lower Body Dressing: with min assist Pt Will Transfer to Toilet: with supervision;ambulating;bedside commode Pt Will Perform Toileting - Clothing Manipulation and hygiene: with supervision;sit to/from stand Additional ADL Goal #1: Pt will be S in and OOB for basic ADLs following sternal precautions  Plan Discharge plan remains appropriate    Co-evaluation                 AM-PAC PT "6 Clicks" Daily Activity     Outcome Measure   Help from another person eating meals?: None Help from another person taking care of personal grooming?: A Little Help from another person toileting, which includes using toliet, bedpan, or urinal?: A Lot Help from another person bathing (including washing, rinsing, drying)?: A Lot Help from another person to put on and taking off regular upper body clothing?: A Lot Help from another person to put on and taking off regular lower body clothing?: A Lot 6 Click Score: 15    End of Session    OT Visit Diagnosis: Unsteadiness on feet (R26.81);Other abnormalities of gait and mobility (R26.89);Muscle weakness (generalized) (M62.81);Pain   Activity Tolerance Patient tolerated treatment well   Patient Left in chair;with call bell/phone within reach   Nurse Communication          Time: 7035-0093 OT Time Calculation (min): 21 min  Charges: OT General Charges $OT Visit: 1 Visit OT Treatments $Self Care/Home Management : 8-22 mins    Hortencia Pilar 01/21/2018, 12:02 PM

## 2018-01-21 NOTE — Progress Notes (Signed)
CARDIAC REHAB PHASE I   PRE:  Rate/Rhythm: 85 SR  BP:  Supine: 117/65  Sitting:   Standing:    SaO2: 93%RA  MODE:  Ambulation: 430 ft   POST:  Rate/Rhythm: 96 SR  BP:  Supine:   Sitting: 109/60  Standing:    SaO2: 96%RA 1000-1035 Pt walked 430 ft on RA with rolling walker and minimal asst. Stopped once to rest. Tolerated well. To recliner after walk. IS and flutter near pt. Encouraged two more walks today.   Graylon Good, RN BSN  01/21/2018 10:30 AM

## 2018-01-21 NOTE — Care Management Important Message (Signed)
Important Message  Patient Details  Name: [name] MRN: [id number] Date of Birth: 1965-09-10   Medicare Important Message Given:  Yes    Loann Quill 01/21/2018, 11:33 AM

## 2018-01-21 NOTE — Telephone Encounter (Signed)
Patient still currently admitted.  

## 2018-01-21 NOTE — Progress Notes (Addendum)
[address]            [place] [address]          [phone]   7 Days Post-Op Procedure(s) (LRB): CORONARY ARTERY BYPASS GRAFTING (CABG)X3, RIGHT AND LEFT SAPHENOUS VEIN HARVEST, MAMMARY TAKE DOWN. MAMMARY TO LAD, SVG TO PD, SVG TO DISTAL CIRC. (N/A) TRANSESOPHAGEAL ECHOCARDIOGRAM (TEE) (N/A)  Total Length of Stay:  LOS: 17 days   Subjective: Patient states her nausea is less. She is slowly feeling better.  Objective: Vital signs in last 24 hours: Temp:  [97.7 F (36.5 C)-99.6 F (37.6 C)] 98.6 F (37 C) (07/30 0402) Pulse Rate:  [84-100] 94 (07/30 0402) Cardiac Rhythm: Normal sinus rhythm (07/30 0700) Resp:  [12-28] 18 (07/30 0642) BP: (109-133)/(61-74) 113/61 (07/30 0402) SpO2:  [90 %-96 %] 90 % (07/30 0402) Weight:  [148 lb 9.6 oz (67.4 kg)] 148 lb 9.6 oz (67.4 kg) (07/30 0642)  Filed Weights   01/19/18 0600 01/20/18 0400 01/21/18 0642  Weight: 149 lb 4 oz (67.7 kg) 144 lb 2.9 oz (65.4 kg) 148 lb 9.6 oz (67.4 kg)    Weight change: 4 lb 6.7 oz (2.005 kg)      Intake/Output from previous day: 07/29 0701 - 07/30 0700 In: 240 [P.O.:240] Out: 560 [Urine:545; Emesis/NG output:15]  Intake/Output this shift: No intake/output data recorded.  Current Meds: Scheduled Meds: . aspirin EC  325 mg Oral Daily  . atorvastatin  80 mg Oral q1800  . busPIRone  10 mg Oral TID  . Chlorhexidine Gluconate Cloth  6 each Topical Daily  . docusate sodium  200 mg Oral Daily  . enoxaparin (LOVENOX) injection  30 mg Subcutaneous QHS  . fluticasone  2 spray Each Nare Daily  . furosemide  20 mg Oral Daily  . insulin aspart  0-24 Units Subcutaneous TID AC & HS  . insulin detemir  15 Units Subcutaneous Daily  . mouth rinse  15 mL Mouth Rinse BID  . metoprolol tartrate  12.5 mg Oral BID  . pantoprazole  40 mg Oral QAC breakfast  . potassium chloride  10 mEq Oral BID  . pregabalin  150 mg Oral Daily  . pyridostigmine  30 mg Oral TID WC  . sodium  chloride flush  10 mL Intracatheter Q12H  . sodium chloride flush  3 mL Intravenous Q12H   Continuous Infusions: . sodium chloride     PRN Meds:.sodium chloride, acetaminophen, alum & mag hydroxide-simeth, bisacodyl **OR** bisacodyl, guaiFENesin, metoCLOPramide (REGLAN) injection, ondansetron, polyethylene glycol, promethazine, sodium chloride flush, sodium chloride flush  Heart: RRR Lungs: Mostly clear Abdomen: Soft, non tender, sporadic bowel sounds Extremities: Mild LE edema Wound: Sternal and bilateral LE wounds clean and dry,no signs of infection.  Lab Results: CBC: Recent Labs    01/20/18 0410 01/21/18 0442  WBC 6.6 7.9  HGB 10.2* 10.3*  HCT 33.3* 33.3*  PLT 249 304   BMET:  Recent Labs    01/20/18 0410 01/21/18 0442  NA 135 135  K 4.1 4.1  CL 101 101  CO2 28 26  GLUCOSE 104* 143*  BUN 20 18  CREATININE 1.24* 1.19*  CALCIUM 8.5* 8.6*    CMET: Lab Results  Component Value Date   WBC 7.9 01/21/2018   HGB 10.3 (L) 01/21/2018   HCT 33.3 (L) 01/21/2018   PLT 304 01/21/2018   GLUCOSE 143 (H) 01/21/2018  CHOL 121 01/07/2018   TRIG 101 01/07/2018   HDL 47 01/07/2018   LDLCALC 54 01/07/2018   ALT 67 (H) 01/20/2018   AST 97 (H) 01/20/2018   NA 135 01/21/2018   K 4.1 01/21/2018   CL 101 01/21/2018   CREATININE 1.19 (H) 01/21/2018   BUN 18 01/21/2018   CO2 26 01/21/2018   TSH 0.632 03/25/2017   INR 1.33 01/14/2018   HGBA1C 8.6 (H) 01/08/2018   MICROALBUR 24.6 09/04/2016      PT/INR:  No results for input(s): LABPROT, INR in the last 72 hours. Radiology: No results found.   Assessment/Plan: S/P Procedure(s) (LRB): CORONARY ARTERY BYPASS GRAFTING (CABG)X3, RIGHT AND LEFT SAPHENOUS VEIN HARVEST, MAMMARY TAKE DOWN. MAMMARY TO LAD, SVG TO PD, SVG TO DISTAL CIRC. (N/A) TRANSESOPHAGEAL ECHOCARDIOGRAM (TEE) (N/A)  1. CV-  On Lopressor 12.5 mg bid.  2. Pulmonary-On room air. Will obtain PA/LAT CXR this am. Encourage incentive spirometer and flutter  valve. 3. Volume overload-Lasix 20 mg daily 4. GI-history of gastroparesis. She has received Reglan on occasion. 5. DM-CBGs 176/159/160. On Insulin. Pre op HGA1C 8.6. She will need close medical follow up after discharge. 6. ABL anemia-H and H 10.3 and 33.2 7. Remove EPW 8. Hopefully, home later in week  Donielle Liston [place] PA-C 01/21/2018 7:35 [place] [facility] [place] rehab in 1-2 days  I have seen and examined [name] and agree with the above assessment  and plan.  Grace Isaac MD [facility] [phone] Office [phone] 01/21/2018 6:09 PM

## 2018-01-21 NOTE — Progress Notes (Signed)
Clinical Social Worker following patient for support and discharge needs. Patient at this time has a bed at [facility] once medically cleared by MD.   Rhea Pink, MSW,  [place]

## 2018-01-22 LAB — GLUCOSE, CAPILLARY
GLUCOSE-CAPILLARY: 130 mg/dL — AB (ref 70–99)
GLUCOSE-CAPILLARY: 145 mg/dL — AB (ref 70–99)
GLUCOSE-CAPILLARY: 165 mg/dL — AB (ref 70–99)
Glucose-Capillary: 190 mg/dL — ABNORMAL HIGH (ref 70–99)
Glucose-Capillary: 337 mg/dL — ABNORMAL HIGH (ref 70–99)

## 2018-01-22 MED ORDER — PROMETHAZINE HCL 25 MG/ML IJ SOLN
12.5000 mg | Freq: Four times a day (QID) | INTRAMUSCULAR | Status: DC | PRN
Start: 2018-01-22 — End: 2018-01-25
  Administered 2018-01-22: 12.5 mg via INTRAVENOUS
  Filled 2018-01-22 (×2): qty 1

## 2018-01-22 NOTE — Progress Notes (Signed)
Physical Therapy Treatment Patient Details Name: [name] MRN: [id number] DOB: 07-10-1965 Today's Date: 01/22/2018    History of Present Illness Pt is a 52 y.o. female with h/o uncontrolled DM type 1, admitted 01/04/18 with DKA; found to have NSTEMI. Cardiac cath 7/16 showed multivessel severe CAD. CABGx3 on 7/23. PMH includes HTN, DM, anxiety, depression.    PT Comments    Pt with nausea this AM so ambulation distance more limited. Continue to recommend SNF.   Follow Up Recommendations  SNF;Supervision for mobility/OOB     Equipment Recommendations  Rolling walker with 5" wheels    Recommendations for Other Services       Precautions / Restrictions Precautions Precautions: Fall;Sternal Precaution Booklet Issued: Yes (comment)    Mobility  Bed Mobility Overal bed mobility: Needs Assistance Bed Mobility: Sidelying to Sit;Sit to Supine Rolling: Supervision Sidelying to sit: Supervision   Sit to supine: Min assist   General bed mobility comments: Assist to bring feet back up into bed  Transfers Overall transfer level: Needs assistance Equipment used: Rolling walker (2 wheeled) Transfers: Sit to/from Stand Sit to Stand: Min guard         General transfer comment: min guard for balance and safety. Verbal cues for hands to knees  Ambulation/Gait Ambulation/Gait assistance: Min guard Gait Distance (Feet): 250 Feet Assistive device: Rolling walker (2 wheeled) Gait Pattern/deviations: Step-through pattern;Decreased stride length;Trunk flexed Gait velocity: Decreased Gait velocity interpretation: 1.31 - 2.62 ft/sec, indicative of limited community ambulator General Gait Details: Assist for safety. Verbal cues to stand more erect   Stairs             Wheelchair Mobility    Modified Rankin (Stroke Patients Only)       Balance Overall balance assessment: Needs assistance Sitting-balance support: Feet supported;No upper extremity supported Sitting  balance-Leahy Scale: Good     Standing balance support: No upper extremity supported Standing balance-Leahy Scale: Fair                              Cognition Arousal/Alertness: Awake/alert Behavior During Therapy: WFL for tasks assessed/performed Overall Cognitive Status: Within Functional Limits for tasks assessed                                        Exercises      General Comments        Pertinent Vitals/Pain Pain Assessment: Faces Faces Pain Scale: Hurts little more Pain Location: chest Pain Descriptors / Indicators: Sore Pain Intervention(s): Limited activity within patient's tolerance;Monitored during session    Home Living                      Prior Function            PT Goals (current goals can now be found in the care plan section) Progress towards PT goals: Not progressing toward goals - comment(nausea)    Frequency    Min 3X/week      PT Plan Current plan remains appropriate    Co-evaluation              AM-PAC PT "6 Clicks" Daily Activity  Outcome Measure  Difficulty turning over in bed (including adjusting bedclothes, sheets and blankets)?: A Little Difficulty moving from lying on back to sitting on the side of the bed? : A Lot  Difficulty sitting down on and standing up from a chair with arms (e.g., wheelchair, bedside commode, etc,.)?: Unable Help needed moving to and from a bed to chair (including a wheelchair)?: A Little Help needed walking in hospital room?: A Little Help needed climbing 3-5 steps with a railing? : A Little 6 Click Score: 15    End of Session Equipment Utilized During Treatment: Gait belt Activity Tolerance: Other (comment)(Limited by nausea) Patient left: with call bell/phone within reach;in bed Nurse Communication: Mobility status PT Visit Diagnosis: Muscle weakness (generalized) (M62.81);Other abnormalities of gait and mobility (R26.89)     Time: 2263-3354 PT Time  Calculation (min) (ACUTE ONLY): 30 min  Charges:  $Gait Training: 23-37 mins                     [place] 01/22/2018, 12:48 PM

## 2018-01-22 NOTE — Telephone Encounter (Addendum)
**Note De-Identified  Obfuscation** According to the pts chart she is still in the hospital. Will call once the pt has been discharged.

## 2018-01-22 NOTE — Progress Notes (Addendum)
      [address]       [place] [address]             [phone]      8 Days Post-Op Procedure(s) (LRB): CORONARY ARTERY BYPASS GRAFTING (CABG)X3, RIGHT AND LEFT SAPHENOUS VEIN HARVEST, MAMMARY TAKE DOWN. MAMMARY TO LAD, SVG TO PD, SVG TO DISTAL CIRC. (N/A) TRANSESOPHAGEAL ECHOCARDIOGRAM (TEE) (N/A) Subjective: Nausea this morning but no vomiting. Is going to try to eat her breakfast  Objective: Vital signs in last 24 hours: Temp:  [98.2 F (36.8 C)-99.6 F (37.6 C)] 99.1 F (37.3 C) (07/31 0428) Pulse Rate:  [80-97] 94 (07/31 0428) Cardiac Rhythm: Normal sinus rhythm (07/31 0700) Resp:  [17-36] 36 (07/31 0428) BP: (106-135)/(57-109) 127/65 (07/31 0428) SpO2:  [92 %-100 %] 97 % (07/31 0428) Weight:  [67.4 kg (148 lb 8 oz)] 67.4 kg (148 lb 8 oz) (07/31 0428)     Intake/Output from previous day: 07/30 0701 - 07/31 0700 In: 420 [P.O.:420] Out: -  Intake/Output this shift: No intake/output data recorded.  General appearance: alert, cooperative and no distress Heart: regular rate and rhythm, S1, S2 normal, no murmur, click, rub or gallop Lungs: clear to auscultation bilaterally Abdomen: soft, non-tender; bowel sounds normal; no masses,  no organomegaly Extremities: extremities normal, atraumatic, no cyanosis or edema Wound: clean and dry  Lab Results: Recent Labs    01/20/18 0410 01/21/18 0442  WBC 6.6 7.9  HGB 10.2* 10.3*  HCT 33.3* 33.3*  PLT 249 304   BMET:  Recent Labs    01/20/18 0410 01/21/18 0442  NA 135 135  K 4.1 4.1  CL 101 101  CO2 28 26  GLUCOSE 104* 143*  BUN 20 18  CREATININE 1.24* 1.19*  CALCIUM 8.5* 8.6*    PT/INR: No results for input(s): LABPROT, INR in the last 72 hours. ABG    Component Value Date/Time   PHART 7.348 (L) 01/14/2018 2211   HCO3 20.2 01/14/2018 2211   TCO2 22 01/15/2018 1703   ACIDBASEDEF 5.0 (H) 01/14/2018 2211   O2SAT 65.6 01/17/2018 0835   CBG (last 3)  Recent Labs    01/21/18 2033  01/22/18 0446 01/22/18 0613  GLUCAP 218* 130* 145*    Assessment/Plan: S/P Procedure(s) (LRB): CORONARY ARTERY BYPASS GRAFTING (CABG)X3, RIGHT AND LEFT SAPHENOUS VEIN HARVEST, MAMMARY TAKE DOWN. MAMMARY TO LAD, SVG TO PD, SVG TO DISTAL CIRC. (N/A) TRANSESOPHAGEAL ECHOCARDIOGRAM (TEE) (N/A)  1. CV-BP is stable, NSR in the 80s. Continue ASA, statin, and lopressor.  2. Pulm-tolerating 2L [place]. Continue to wean as tolerated. CXR yesterday showed Bibasilar atelectatic changes are noted right greater than left with associated small effusions. No new focal infiltrate is seen. No bony abnormality is noted. 3. Renal-creatinine 1.19, electrolytes okay, continue lasix 20mg  daily 4. H and H 10.3/33.3, platelets 304k 5. Endo-blood glucose well controlled 6. GI-history of gastroparesis, on Reglan. ++nausea but no vomiting  Plan: Added phenergan for better relief of nausea. Wean oxygen as tolerated. Continue diuretics for fluid overload. Continue to increase oral intake as tolerated.    LOS: 18 days    Elgie Collard 01/22/2018  Some nausea today Did eat breakfast Hope to rehab Thursday or Friday  I have seen and examined [name] and agree with the above assessment  and plan.  Grace Isaac MD [facility] [phone] Office [phone] 01/22/2018 11:16 AM

## 2018-01-22 NOTE — Progress Notes (Signed)
1420-1450 Pt eating lunch slowly. Feeling a little better but wants to rest at this time. 88 NSR and 98%RA. Wrote down how to view discharge video. Discussed sternal precautions and staying in the tube, IS and flutter valve, ex ed and carb counting and heart healthy food choices. Referring to [place] CRP 2. Encouraged walk later with staff. Graylon Good RN BSN 01/22/2018 2:47 PM

## 2018-01-23 ENCOUNTER — Telehealth: Payer: Self-pay

## 2018-01-23 LAB — URINALYSIS, ROUTINE W REFLEX MICROSCOPIC
BILIRUBIN URINE: NEGATIVE
Glucose, UA: NEGATIVE mg/dL
Ketones, ur: NEGATIVE mg/dL
Nitrite: NEGATIVE
PH: 5 (ref 5.0–8.0)
Protein, ur: 30 mg/dL — AB
SPECIFIC GRAVITY, URINE: 1.018 (ref 1.005–1.030)

## 2018-01-23 LAB — GLUCOSE, CAPILLARY
GLUCOSE-CAPILLARY: 284 mg/dL — AB (ref 70–99)
GLUCOSE-CAPILLARY: 265 mg/dL — AB (ref 70–99)
Glucose-Capillary: 161 mg/dL — ABNORMAL HIGH (ref 70–99)
Glucose-Capillary: 304 mg/dL — ABNORMAL HIGH (ref 70–99)

## 2018-01-23 MED ORDER — GUAIFENESIN ER 600 MG PO TB12
1200.0000 mg | ORAL_TABLET | Freq: Two times a day (BID) | ORAL | Status: DC
  Administered 2018-01-23 – 2018-01-25 (×5): 1200 mg via ORAL
  Filled 2018-01-23 (×5): qty 2

## 2018-01-23 NOTE — Progress Notes (Signed)
Patient walked with a walker for about 41ft. Tolerated it well.

## 2018-01-23 NOTE — Progress Notes (Addendum)
      [address]       [place] [address]             [phone]      9 Days Post-Op Procedure(s) (LRB): CORONARY ARTERY BYPASS GRAFTING (CABG)X3, RIGHT AND LEFT SAPHENOUS VEIN HARVEST, MAMMARY TAKE DOWN. MAMMARY TO LAD, SVG TO PD, SVG TO DISTAL CIRC. (N/A) TRANSESOPHAGEAL ECHOCARDIOGRAM (TEE) (N/A) Subjective: Feels okay this morning. Having a lot of pain mid sternum.   Objective: Vital signs in last 24 hours: Temp:  [98.4 F (36.9 C)-99 F (37.2 C)] 99 F (37.2 C) (08/01 0630) Pulse Rate:  [85-97] 88 (08/01 0630) Cardiac Rhythm: Normal sinus rhythm (08/01 0630) Resp:  [17-37] 37 (08/01 0630) BP: (116-144)/(61-69) 134/69 (08/01 0630) SpO2:  [92 %-94 %] 94 % (08/01 0630) Weight:  [65.8 kg (145 lb 1 oz)-65.9 kg (145 lb 3.2 oz)] 65.8 kg (145 lb 1 oz) (08/01 0630)     Intake/Output from previous day: 07/31 0701 - 08/01 0700 In: 10 [I.V.:10] Out: 900 [Urine:900] Intake/Output this shift: No intake/output data recorded.  General appearance: alert, cooperative and no distress Heart: regular rate and rhythm, S1, S2 normal, no murmur, click, rub or gallop Lungs: clear to auscultation bilaterally Abdomen: soft, non-tender; bowel sounds normal; no masses,  no organomegaly Extremities: extremities normal, atraumatic, no cyanosis or edema Wound: clean and dry  Lab Results: Recent Labs    01/21/18 0442  WBC 7.9  HGB 10.3*  HCT 33.3*  PLT 304   BMET:  Recent Labs    01/21/18 0442  NA 135  K 4.1  CL 101  CO2 26  GLUCOSE 143*  BUN 18  CREATININE 1.19*  CALCIUM 8.6*    PT/INR: No results for input(s): LABPROT, INR in the last 72 hours. ABG    Component Value Date/Time   PHART 7.348 (L) 01/14/2018 2211   HCO3 20.2 01/14/2018 2211   TCO2 22 01/15/2018 1703   ACIDBASEDEF 5.0 (H) 01/14/2018 2211   O2SAT 65.6 01/17/2018 0835   CBG (last 3)  Recent Labs    01/22/18 1715 01/22/18 2118 01/23/18 0625  GLUCAP 190* 337* 161*     Assessment/Plan: S/P Procedure(s) (LRB): CORONARY ARTERY BYPASS GRAFTING (CABG)X3, RIGHT AND LEFT SAPHENOUS VEIN HARVEST, MAMMARY TAKE DOWN. MAMMARY TO LAD, SVG TO PD, SVG TO DISTAL CIRC. (N/A) TRANSESOPHAGEAL ECHOCARDIOGRAM (TEE) (N/A)  1. CV-BP is stable, NSR in the 80s. Continue ASA, statin, and lopressor.  2. Pulm-tolerating room air. Continue to wean as tolerated. CXR yesterday showed Bibasilar atelectatic changes are noted right greater than left with associated small effusions. No new focal infiltrate is seen. No bony abnormality is noted. 3. Renal-creatinine 1.19, electrolytes okay, continue lasix 20mg  daily 4. H and H 10.3/33.3, platelets 304k-no new labs 5. Endo-blood glucose well controlled 6. GI-history of gastroparesis, on Reglan. ++nausea but no vomiting. Positive bowel movement yesterday  Plan: Better nausea control with phenergan.  Continue diuretics for fluid overload. Continue to increase oral intake as tolerated. Continue to work on pain control with just tylenol.      LOS: 19 days    Elgie Collard 01/23/2018  Plan to [facility] tomorrow  I have seen and examined [name] and agree with the above assessment  and plan.  Grace Isaac MD Beeper [phone] Office [phone] 01/23/2018 8:23 AM

## 2018-01-23 NOTE — Progress Notes (Signed)
Inpatient Diabetes Program Recommendations  AACE/ADA: New Consensus Statement on Inpatient Glycemic Control (2015)  Target Ranges:  Prepandial:   less than 140 mg/dL      Peak postprandial:   less than 180 mg/dL (1-2 hours)      Critically ill patients:  140 - 180 mg/dL   Lab Results  Component Value Date   GLUCAP 284 (H) 01/23/2018   HGBA1C 8.6 (H) 01/08/2018    Review of Glycemic ControlResults for ALMADELIA, LOOMAN (MRN [id number]) as of 01/23/2018 14:03  Ref. Range 01/22/2018 10:56 01/22/2018 17:15 01/22/2018 21:18 01/23/2018 06:25 01/23/2018 11:22  Glucose-Capillary Latest Ref Range: 70 - 99 mg/dL 165 (H) 190 (H) 337 (H) 161 (H) 284 (H)    Diabetes history: Type 2 DM  Diabetes history: Outpatient Diabetes medications:Levemir 15 units daily, Novolog custom sliding scale 0-12 units four times per day Current orders for Inpatient glycemic control:  TCTS tid with meals and HS, Levemir 15 units daily Inpatient Diabetes Program Recommendations:    May consider:   -Reducing Novolog correction to moderate tid with meals.    - Adding Novolog meal coverage 3 units tid with meals.     -Increase Levemir to 18 units daily.  Thanks,  Adah Perl, RN, BC-ADM Inpatient Diabetes Coordinator Pager [phone] (8a-5p)

## 2018-01-23 NOTE — Telephone Encounter (Signed)
Met with the patient this morning. She stated that she is feeling much better and is anxious to go to [facility]. She has 11 steps to her apartment and said that she needs to be strong enough to negotiate the stairs   She explained to this CM that [facility] will not be providing her lyrica and she will need to bring her own from home. She said that she called [facility] to order more as she has been receiving it through the [facility] program and it is delivered to her house.  This CM spoke to [facility], [place] regarding the status of the lyrica order. He explained that just last week, lyrica became a generic and has been cut from the PASS program.  It is still a controlled substance and [facility] does not carry it. It would need to be ordered through a local retail pharmacy and he is not sure of the cost.  The patient does not have medication coverage with her insurance.  Call placed to Olga Coaster, RN CM to inform her of the status of lyrica. She is covering for Marvetta Gibbons, RN CM  Call placed to the patient and informed her of the above information regarding lyrica. She is very concerned about getting this medication.  She inquired if gabapentin could be offered as an alternative and she said that [place], SW has been working with her to get her to [employer].   Call placed to [facility], SW and informed her of the status of the lyrica and the patient's concerns.  Can the hospital provider order the lyrica at a local pharmacy?  Is gabapentin an option?  Would Lyrica now be covered at the facility since it has changed status? She noted that she would follow up for the patient.

## 2018-01-23 NOTE — Progress Notes (Signed)
CARDIAC REHAB PHASE I   PRE:  Rate/Rhythm: 87 SR  BP:  Supine:   Sitting: 125/65  Standing:    SaO2: 97%RA  MODE:  Ambulation: 470 ft   POST:  Rate/Rhythm: 94 SR  BP:  Supine:   Sitting: 134/69  Standing:    SaO2: 100%RA 1036-1105 Pt walked 470 ft on RA with rolling walker and minimal asst. Stopped once to rest. Tolerated well. To recliner after walk.    Graylon Good, RN BSN  01/23/2018 11:02 AM

## 2018-01-24 ENCOUNTER — Inpatient Hospital Stay (HOSPITAL_COMMUNITY): Payer: Medicare Other

## 2018-01-24 LAB — CBC
HCT: 35.6 % — ABNORMAL LOW (ref 36.0–46.0)
Hemoglobin: 11 g/dL — ABNORMAL LOW (ref 12.0–15.0)
MCH: 28.4 pg (ref 26.0–34.0)
MCHC: 30.9 g/dL (ref 30.0–36.0)
MCV: 91.8 fL (ref 78.0–100.0)
Platelets: 427 10*3/uL — ABNORMAL HIGH (ref 150–400)
RBC: 3.88 MIL/uL (ref 3.87–5.11)
RDW: 16.7 % — ABNORMAL HIGH (ref 11.5–15.5)
WBC: 8.9 10*3/uL (ref 4.0–10.5)

## 2018-01-24 LAB — GLUCOSE, CAPILLARY
GLUCOSE-CAPILLARY: 156 mg/dL — AB (ref 70–99)
Glucose-Capillary: 135 mg/dL — ABNORMAL HIGH (ref 70–99)
Glucose-Capillary: 258 mg/dL — ABNORMAL HIGH (ref 70–99)
Glucose-Capillary: 188 mg/dL — ABNORMAL HIGH (ref 70–99)

## 2018-01-24 LAB — BASIC METABOLIC PANEL
Anion gap: 10 (ref 5–15)
BUN: 14 mg/dL (ref 6–20)
CO2: 25 mmol/L (ref 22–32)
Calcium: 8.9 mg/dL (ref 8.9–10.3)
Chloride: 103 mmol/L (ref 98–111)
Creatinine, Ser: 1.21 mg/dL — ABNORMAL HIGH (ref 0.44–1.00)
GFR calc Af Amer: 59 mL/min — ABNORMAL LOW (ref >60)
GFR calc non Af Amer: 51 mL/min — ABNORMAL LOW (ref >60)
Glucose, Bld: 56 mg/dL — ABNORMAL LOW (ref 70–99)
Potassium: 4.2 mmol/L (ref 3.5–5.1)
Sodium: 138 mmol/L (ref 135–145)

## 2018-01-24 MED ORDER — ALTEPLASE 2 MG IJ SOLR: 2.0000 mg | Freq: Once | INTRAMUSCULAR | Status: DC

## 2018-01-24 MED ORDER — ALTEPLASE 2 MG IJ SOLR
2.0000 mg | Freq: Once | INTRAMUSCULAR | Status: AC
  Administered 2018-01-24: 2 mg
  Filled 2018-01-24: qty 2

## 2018-01-24 NOTE — Progress Notes (Signed)
[facility] will sign off.   Medication Recommendations:  None.  She is on appropriate therapy for secondary prevention.  Other recommendations (labs, testing, etc):  Repeat fasting lipids/CMP in 6-8 weeks. Follow up as an outpatient:  Scheduled with Truitt Merle 8/12.  Litisha Guagliardo C. Oval Linsey, MD, [facility] 01/24/2018 9:17 AM

## 2018-01-24 NOTE — Care Management Important Message (Signed)
Important Message  Patient Details  Name: [name] MRN: [id number] Date of Birth: [birth year]   Medicare Important Message Given:  Yes    Emaly Boschert P Ellanora Rayborn 01/24/2018, 3:05 PM

## 2018-01-24 NOTE — Progress Notes (Signed)
Patient needs motivation and encouragement. Does not want to do ADL's. Wants staff to do everything including lifting a cup from the bedside table, moving her legs, or washing face with a wash cloth. Patient will need a lot of reinforcement.

## 2018-01-24 NOTE — Progress Notes (Signed)
Physical Therapy Treatment Patient Details Name: [name] MRN: [id number] DOB: 14-Jan-1966 Today's Date: 01/24/2018    History of Present Illness Pt is a 52 y.o. female with h/o uncontrolled DM type 1, admitted 01/04/18 with DKA; found to have NSTEMI. Cardiac cath 7/16 showed multivessel severe CAD. CABGx3 on 7/23. PMH includes HTN, DM, anxiety, depression.    PT Comments    Pt progressing slowly. Continues to have flat affect with little initiation. Continue to recommend SNF.   Follow Up Recommendations  SNF;Supervision for mobility/OOB     Equipment Recommendations  Rolling walker with 5" wheels    Recommendations for Other Services       Precautions / Restrictions Precautions Precautions: Fall;Sternal Precaution Booklet Issued: Yes (comment)    Mobility  Bed Mobility Overal bed mobility: Needs Assistance Bed Mobility: Sidelying to Sit;Sit to Supine Rolling: Supervision Sidelying to sit: Min assist   Sit to supine: Min assist   General bed mobility comments: Assist to elevate trunk into sitting. Assist to bring feet back up into bed  Transfers Overall transfer level: Needs assistance Equipment used: Rolling walker (2 wheeled) Transfers: Sit to/from Stand Sit to Stand: Min guard;Min assist         General transfer comment: min assist to bring hips up from low toilet. min guard for balance and safety. Verbal cues for hands to knees  Ambulation/Gait Ambulation/Gait assistance: Min guard Gait Distance (Feet): 325 Feet Assistive device: Rolling walker (2 wheeled);None Gait Pattern/deviations: Step-through pattern;Decreased stride length;Trunk flexed Gait velocity: Decreased Gait velocity interpretation: <1.31 ft/sec, indicative of household ambulator General Gait Details: Assist for safety. Verbal cues to stand more erect. Amb in room without assistive device   Stairs             Wheelchair Mobility    Modified Rankin (Stroke Patients Only)        Balance Overall balance assessment: Needs assistance Sitting-balance support: Feet supported;No upper extremity supported Sitting balance-Leahy Scale: Good     Standing balance support: No upper extremity supported Standing balance-Leahy Scale: Fair                              Cognition Arousal/Alertness: Awake/alert Behavior During Therapy: Flat affect Overall Cognitive Status: Within Functional Limits for tasks assessed                                        Exercises      General Comments        Pertinent Vitals/Pain Pain Assessment: Faces Faces Pain Scale: Hurts a little bit Pain Location: chest Pain Descriptors / Indicators: Sore Pain Intervention(s): Monitored during session;Limited activity within patient's tolerance    Home Living                      Prior Function            PT Goals (current goals can now be found in the care plan section) Progress towards PT goals: Progressing toward goals    Frequency    Min 2X/week      PT Plan Current plan remains appropriate;Frequency needs to be updated    Co-evaluation              AM-PAC PT "6 Clicks" Daily Activity  Outcome Measure  Difficulty turning over in bed (including adjusting bedclothes, sheets  and blankets)?: A Little Difficulty moving from lying on back to sitting on the side of the bed? : Unable Difficulty sitting down on and standing up from a chair with arms (e.g., wheelchair, bedside commode, etc,.)?: Unable Help needed moving to and from a bed to chair (including a wheelchair)?: A Little Help needed walking in hospital room?: A Little Help needed climbing 3-5 steps with a railing? : A Little 6 Click Score: 14    End of Session Equipment Utilized During Treatment: Gait belt Activity Tolerance: Patient tolerated treatment well Patient left: with call bell/phone within reach;in bed Nurse Communication: Mobility status PT Visit  Diagnosis: Muscle weakness (generalized) (M62.81);Other abnormalities of gait and mobility (R26.89)     Time: 4239-5320 PT Time Calculation (min) (ACUTE ONLY): 22 min  Charges:  $Gait Training: 8-22 mins                     [facility] PT [place] 01/24/2018, 4:15 PM

## 2018-01-24 NOTE — Plan of Care (Signed)
Patient states that her diarrhea is getting better, however patient still complains of nausea.  Patient out of bed to ambulate in halls with walker

## 2018-01-24 NOTE — Progress Notes (Addendum)
      [address]       [place] [address]             [phone]      10 Days Post-Op Procedure(s) (LRB): CORONARY ARTERY BYPASS GRAFTING (CABG)X3, RIGHT AND LEFT SAPHENOUS VEIN HARVEST, MAMMARY TAKE DOWN. MAMMARY TO LAD, SVG TO PD, SVG TO DISTAL CIRC. (N/A) TRANSESOPHAGEAL ECHOCARDIOGRAM (TEE) (N/A) Subjective: She is having diarrhea this morning  Objective: Vital signs in last 24 hours: Temp:  [98 F (36.7 C)-98.7 F (37.1 C)] 98.6 F (37 C) (08/02 0739) Pulse Rate:  [77-87] 87 (08/02 0344) Cardiac Rhythm: Normal sinus rhythm (08/02 0700) Resp:  [16-33] 16 (08/02 0344) BP: (99-150)/(59-80) 110/59 (08/02 0739) SpO2:  [93 %-99 %] 96 % (08/02 0739) Weight:  [66.7 kg (147 lb 0.8 oz)] 66.7 kg (147 lb 0.8 oz) (08/02 0344)     Intake/Output from previous day: 08/01 0701 - 08/02 0700 In: 600 [P.O.:600] Out: 650 [Urine:650] Intake/Output this shift: No intake/output data recorded.  General appearance: alert, cooperative and no distress Heart: regular rate and rhythm, S1, S2 normal, no murmur, click, rub or gallop Lungs: clear to auscultation bilaterally Abdomen: soft, non-tender; bowel sounds normal; no masses,  no organomegaly Extremities: extremities normal, atraumatic, no cyanosis or edema Wound: clean and dry  Lab Results: Recent Labs    01/24/18 0327  WBC 8.9  HGB 11.0*  HCT 35.6*  PLT 427*   BMET:  Recent Labs    01/24/18 0327  NA 138  K 4.2  CL 103  CO2 25  GLUCOSE 56*  BUN 14  CREATININE 1.21*  CALCIUM 8.9    PT/INR: No results for input(s): LABPROT, INR in the last 72 hours. ABG    Component Value Date/Time   PHART 7.348 (L) 01/14/2018 2211   HCO3 20.2 01/14/2018 2211   TCO2 22 01/15/2018 1703   ACIDBASEDEF 5.0 (H) 01/14/2018 2211   O2SAT 65.6 01/17/2018 0835   CBG (last 3)  Recent Labs    01/23/18 1615 01/23/18 2106 01/24/18 0608  GLUCAP 265* 304* 135*    Assessment/Plan: S/P Procedure(s) (LRB): CORONARY  ARTERY BYPASS GRAFTING (CABG)X3, RIGHT AND LEFT SAPHENOUS VEIN HARVEST, MAMMARY TAKE DOWN. MAMMARY TO LAD, SVG TO PD, SVG TO DISTAL CIRC. (N/A) TRANSESOPHAGEAL ECHOCARDIOGRAM (TEE) (N/A)  1. CV-BP is stable, NSR in the 80s. Continue ASA, statin, and lopressor.  2. Pulm-tolerating room air. Continue to wean as tolerated.CXR yesterday showedBibasilar atelectatic changes are noted right greater than left with associated small effusions. No new focal infiltrate is seen. No bony abnormality is noted. 3. Renal-creatinine 1.19, electrolytes okay, continue lasix 20mg  daily 4. H and H 10.3/33.3, platelets 304k-no new labs 5. Endo-blood glucose well controlled 6. GI-history of gastroparesis, on Reglan. ++nausea but no vomiting. Positive bowel movement yesterday  Plan: to [facility] tomorrow once diarrhea resolves.    LOS: 20 days    Elgie Collard 01/24/2018  I have seen and examined [name] and agree with the above assessment  and plan.  Grace Isaac MD Beeper [phone] Office [phone] 01/24/2018 1:18 PM

## 2018-01-24 NOTE — Progress Notes (Signed)
CARDIAC REHAB PHASE I   PRE:  Rate/Rhythm: 88 SR  BP:  Supine: 121/70  Sitting:   Standing:    SaO2: 95%RA  MODE:  Ambulation: 300 ft   POST:  Rate/Rhythm: 96 SR  BP:  Supine: 137/65  Sitting:   Standing:    SaO2: 98%RA 0930-1003 Pt not feeling well today. Rough night from diarrhea. Pt walked 300 ft on RA with short rolling walker and minimal asst. Pt back to bed after walk. Coughed up productively. Put IS and flutter valve close to pt. PT to see this pm. Not very talkative this morning, very quiet.   Graylon Good, RN BSN  01/24/2018 10:00 AM

## 2018-01-24 NOTE — Progress Notes (Signed)
Inpatient Diabetes Program Recommendations  AACE/ADA: New Consensus Statement on Inpatient Glycemic Control (2015)  Target Ranges:  Prepandial:   less than 140 mg/dL      Peak postprandial:   less than 180 mg/dL (1-2 hours)      Critically ill patients:  140 - 180 mg/dL   Lab Results  Component Value Date   GLUCAP 135 (H) 01/24/2018   HGBA1C 8.6 (H) 01/08/2018    Review of Glycemic Control Results for [name], [name] (MRN [id number]) as of 01/24/2018 11:14  Ref. Range 01/23/2018 11:22 01/23/2018 16:15 01/23/2018 21:06 01/24/2018 06:08  Glucose-Capillary Latest Ref Range: 70 - 99 mg/dL 284 (H) 265 (H) 304 (H) 135 (H)    Diabetes history: Type 2 DM  Diabetes history: Outpatient Diabetes medications:Levemir 15 units daily, Novolog custom sliding scale 0-12 units four times per day Current orders for Inpatient glycemic control:  TCTS tid with meals and HS, Levemir 15 units daily  Inpatient Diabetes Program Recommendations:    Noted AM serum was 56 mg/dL, of note patient received 44 units of short acting insulin within 24 hour period.    If to remain inpatient may consider:   -Reducing Novolog correction to moderate tid with meals.    - Adding Novolog meal coverage 3 units tid with meals.     -Increase Levemir to 18 units daily  Thanks, Bronson Curb, MSN, RNC-OB Diabetes Coordinator [phone] (8a-5p)

## 2018-01-25 DIAGNOSIS — N390 Urinary tract infection, site not specified: Secondary | ICD-10-CM | POA: Diagnosis not present

## 2018-01-25 DIAGNOSIS — Z951 Presence of aortocoronary bypass graft: Secondary | ICD-10-CM | POA: Diagnosis not present

## 2018-01-25 DIAGNOSIS — E109 Type 1 diabetes mellitus without complications: Secondary | ICD-10-CM | POA: Diagnosis not present

## 2018-01-25 DIAGNOSIS — Z7982 Long term (current) use of aspirin: Secondary | ICD-10-CM | POA: Diagnosis not present

## 2018-01-25 DIAGNOSIS — J90 Pleural effusion, not elsewhere classified: Secondary | ICD-10-CM | POA: Diagnosis not present

## 2018-01-25 DIAGNOSIS — Z794 Long term (current) use of insulin: Secondary | ICD-10-CM | POA: Diagnosis not present

## 2018-01-25 DIAGNOSIS — R2689 Other abnormalities of gait and mobility: Secondary | ICD-10-CM | POA: Diagnosis not present

## 2018-01-25 DIAGNOSIS — R0902 Hypoxemia: Secondary | ICD-10-CM | POA: Diagnosis not present

## 2018-01-25 DIAGNOSIS — I10 Essential (primary) hypertension: Secondary | ICD-10-CM | POA: Diagnosis not present

## 2018-01-25 DIAGNOSIS — G249 Dystonia, unspecified: Secondary | ICD-10-CM | POA: Diagnosis not present

## 2018-01-25 DIAGNOSIS — J309 Allergic rhinitis, unspecified: Secondary | ICD-10-CM | POA: Diagnosis not present

## 2018-01-25 DIAGNOSIS — E119 Type 2 diabetes mellitus without complications: Secondary | ICD-10-CM | POA: Diagnosis not present

## 2018-01-25 DIAGNOSIS — M2550 Pain in unspecified joint: Secondary | ICD-10-CM | POA: Diagnosis not present

## 2018-01-25 DIAGNOSIS — M6281 Muscle weakness (generalized): Secondary | ICD-10-CM | POA: Diagnosis not present

## 2018-01-25 DIAGNOSIS — R279 Unspecified lack of coordination: Secondary | ICD-10-CM | POA: Diagnosis not present

## 2018-01-25 DIAGNOSIS — R112 Nausea with vomiting, unspecified: Secondary | ICD-10-CM | POA: Diagnosis not present

## 2018-01-25 DIAGNOSIS — R300 Dysuria: Secondary | ICD-10-CM | POA: Diagnosis not present

## 2018-01-25 DIAGNOSIS — R41841 Cognitive communication deficit: Secondary | ICD-10-CM | POA: Diagnosis not present

## 2018-01-25 DIAGNOSIS — R1111 Vomiting without nausea: Secondary | ICD-10-CM | POA: Diagnosis not present

## 2018-01-25 DIAGNOSIS — R5381 Other malaise: Secondary | ICD-10-CM | POA: Diagnosis not present

## 2018-01-25 DIAGNOSIS — Z7401 Bed confinement status: Secondary | ICD-10-CM | POA: Diagnosis not present

## 2018-01-25 DIAGNOSIS — R61 Generalized hyperhidrosis: Secondary | ICD-10-CM | POA: Diagnosis not present

## 2018-01-25 DIAGNOSIS — Z79899 Other long term (current) drug therapy: Secondary | ICD-10-CM | POA: Diagnosis not present

## 2018-01-25 DIAGNOSIS — R110 Nausea: Secondary | ICD-10-CM | POA: Diagnosis not present

## 2018-01-25 DIAGNOSIS — K5900 Constipation, unspecified: Secondary | ICD-10-CM | POA: Diagnosis not present

## 2018-01-25 DIAGNOSIS — Z743 Need for continuous supervision: Secondary | ICD-10-CM | POA: Diagnosis not present

## 2018-01-25 DIAGNOSIS — E1165 Type 2 diabetes mellitus with hyperglycemia: Secondary | ICD-10-CM | POA: Diagnosis not present

## 2018-01-25 DIAGNOSIS — R0602 Shortness of breath: Secondary | ICD-10-CM | POA: Diagnosis not present

## 2018-01-25 DIAGNOSIS — I959 Hypotension, unspecified: Secondary | ICD-10-CM | POA: Diagnosis not present

## 2018-01-25 DIAGNOSIS — E1010 Type 1 diabetes mellitus with ketoacidosis without coma: Secondary | ICD-10-CM | POA: Diagnosis not present

## 2018-01-25 DIAGNOSIS — I2510 Atherosclerotic heart disease of native coronary artery without angina pectoris: Secondary | ICD-10-CM | POA: Diagnosis not present

## 2018-01-25 DIAGNOSIS — F419 Anxiety disorder, unspecified: Secondary | ICD-10-CM | POA: Diagnosis not present

## 2018-01-25 DIAGNOSIS — I499 Cardiac arrhythmia, unspecified: Secondary | ICD-10-CM | POA: Diagnosis not present

## 2018-01-25 DIAGNOSIS — I214 Non-ST elevation (NSTEMI) myocardial infarction: Secondary | ICD-10-CM | POA: Diagnosis not present

## 2018-01-25 DIAGNOSIS — K3184 Gastroparesis: Secondary | ICD-10-CM | POA: Diagnosis not present

## 2018-01-25 DIAGNOSIS — R488 Other symbolic dysfunctions: Secondary | ICD-10-CM | POA: Diagnosis not present

## 2018-01-25 DIAGNOSIS — G629 Polyneuropathy, unspecified: Secondary | ICD-10-CM | POA: Diagnosis not present

## 2018-01-25 LAB — GLUCOSE, CAPILLARY: GLUCOSE-CAPILLARY: 148 mg/dL — AB (ref 70–99)

## 2018-01-25 MED ORDER — ACETAMINOPHEN 325 MG PO TABS: 650.0000 mg | ORAL_TABLET | Freq: Four times a day (QID) | ORAL | Status: DC | PRN

## 2018-01-25 MED ORDER — GUAIFENESIN ER 600 MG PO TB12: 1200.0000 mg | ORAL_TABLET | Freq: Two times a day (BID) | ORAL | 0 refills | Status: AC

## 2018-01-25 MED ORDER — PROMETHAZINE HCL 12.5 MG PO TABS: 12.5000 mg | ORAL_TABLET | Freq: Four times a day (QID) | ORAL | 0 refills | Status: DC | PRN

## 2018-01-25 MED ORDER — FUROSEMIDE 20 MG PO TABS: 20.0000 mg | ORAL_TABLET | Freq: Every day | ORAL | 1 refills | Status: DC

## 2018-01-25 MED ORDER — POTASSIUM CHLORIDE CRYS ER 10 MEQ PO TBCR: 10.0000 meq | EXTENDED_RELEASE_TABLET | Freq: Every day | ORAL | 1 refills | Status: DC

## 2018-01-25 MED ORDER — ATORVASTATIN CALCIUM 80 MG PO TABS: 80.0000 mg | ORAL_TABLET | Freq: Every day | ORAL | 1 refills | Status: DC

## 2018-01-25 MED ORDER — GABAPENTIN 300 MG PO CAPS: 300.0000 mg | ORAL_CAPSULE | Freq: Two times a day (BID) | ORAL | 1 refills | Status: DC

## 2018-01-25 MED ORDER — METOPROLOL TARTRATE 25 MG PO TABS: 12.5000 mg | ORAL_TABLET | Freq: Two times a day (BID) | ORAL | 1 refills | Status: DC

## 2018-01-25 MED ORDER — ASPIRIN 325 MG PO TBEC: 325.0000 mg | DELAYED_RELEASE_TABLET | Freq: Every day | ORAL | 0 refills | Status: DC

## 2018-01-25 NOTE — Progress Notes (Signed)
Report called to [facility], received by nurse Darra, VSS , CT sutures removed.

## 2018-01-25 NOTE — Progress Notes (Signed)
      [address]       [place] [address]             [phone]      11 Days Post-Op Procedure(s) (LRB): CORONARY ARTERY BYPASS GRAFTING (CABG)X3, RIGHT AND LEFT SAPHENOUS VEIN HARVEST, MAMMARY TAKE DOWN. MAMMARY TO LAD, SVG TO PD, SVG TO DISTAL CIRC. (N/A) TRANSESOPHAGEAL ECHOCARDIOGRAM (TEE) (N/A) Subjective: She is having some nausea this morning but this is usual for her after a bowel movement. Her diarrhea has resolved.   Objective: Vital signs in last 24 hours: Temp:  [98 F (36.7 C)-98.4 F (36.9 C)] 98.4 F (36.9 C) (08/03 0408) Pulse Rate:  [76-91] 91 (08/03 0408) Cardiac Rhythm: Normal sinus rhythm (08/03 0700) Resp:  [20-27] 24 (08/03 0408) BP: (109-137)/(56-74) 137/74 (08/03 0408) SpO2:  [93 %-99 %] 94 % (08/03 0408) Weight:  [65 kg (143 lb 4.8 oz)] 65 kg (143 lb 4.8 oz) (08/03 0408)     Intake/Output from previous day: 08/02 0701 - 08/03 0700 In: 420 [P.O.:420] Out: -  Intake/Output this shift: No intake/output data recorded.  General appearance: alert, cooperative and no distress Heart: regular rate and rhythm, S1, S2 normal, no murmur, click, rub or gallop Lungs: clear to auscultation bilaterally Abdomen: soft, non-tender; bowel sounds normal; no masses,  no organomegaly Extremities: extremities normal, atraumatic, no cyanosis or edema Wound: clean and dry  Lab Results: Recent Labs    01/24/18 0327  WBC 8.9  HGB 11.0*  HCT 35.6*  PLT 427*   BMET:  Recent Labs    01/24/18 0327  NA 138  K 4.2  CL 103  CO2 25  GLUCOSE 56*  BUN 14  CREATININE 1.21*  CALCIUM 8.9    PT/INR: No results for input(s): LABPROT, INR in the last 72 hours. ABG    Component Value Date/Time   PHART 7.348 (L) 01/14/2018 2211   HCO3 20.2 01/14/2018 2211   TCO2 22 01/15/2018 1703   ACIDBASEDEF 5.0 (H) 01/14/2018 2211   O2SAT 65.6 01/17/2018 0835   CBG (last 3)  Recent Labs    01/24/18 1645 01/24/18 2124 01/25/18 0642  GLUCAP 156* 188*  148*    Assessment/Plan: S/P Procedure(s) (LRB): CORONARY ARTERY BYPASS GRAFTING (CABG)X3, RIGHT AND LEFT SAPHENOUS VEIN HARVEST, MAMMARY TAKE DOWN. MAMMARY TO LAD, SVG TO PD, SVG TO DISTAL CIRC. (N/A) TRANSESOPHAGEAL ECHOCARDIOGRAM (TEE) (N/A)  1. CV-BP is stable, NSR in the 80s. Continue ASA, statin, and lopressor.  2. Pulm-toleratingroom air. Continue incentive spirometer 3. Renal-creatinine 1.21, electrolytes okay, continue lasix 20mg  daily 4. H and H 11.0/35.6, platelets 427k-no new labs 5. Endo-blood glucose well controlled 6. GI-history of gastroparesis, on Reglan. ++nausea but no vomiting. Diarrhea resolved.  Plan: To [facility] place today. Will write for gabapentin 300mg  BID in place of her Lyrica which now she cannot afford.    LOS: 21 days    Elgie Collard 01/25/2018

## 2018-01-25 NOTE — Progress Notes (Signed)
Clinical Social Worker facilitated patient discharge including contacting patient family and facility to confirm patient discharge plans.  Clinical information faxed to facility and family agreeable with plan.  CSW arranged ambulance transport via [facility] to [facility] .  RN to call [phone] ask for [facility] (pt in room 908) for report prior to discharge.  Clinical Social Worker will sign off for now as social work intervention is no longer needed. Please consult [place] again if new need arises.  Rhea Pink, MSW, [place]

## 2018-01-25 NOTE — Progress Notes (Signed)
Received called from [facility] about discharge summary from nurse requesting clarification about AVS.  Listed on the AVS under medication list was "syringe 3cc 22 ga".  I called [name] who indicated that this was not linked to a medication to be administered and should be disregarded.

## 2018-01-26 DIAGNOSIS — K3184 Gastroparesis: Secondary | ICD-10-CM | POA: Diagnosis not present

## 2018-01-26 DIAGNOSIS — G629 Polyneuropathy, unspecified: Secondary | ICD-10-CM | POA: Diagnosis not present

## 2018-01-26 DIAGNOSIS — E1010 Type 1 diabetes mellitus with ketoacidosis without coma: Secondary | ICD-10-CM | POA: Diagnosis not present

## 2018-01-26 DIAGNOSIS — Z951 Presence of aortocoronary bypass graft: Secondary | ICD-10-CM | POA: Diagnosis not present

## 2018-01-27 ENCOUNTER — Other Ambulatory Visit (HOSPITAL_COMMUNITY): Payer: Self-pay | Admitting: *Deleted

## 2018-01-27 DIAGNOSIS — Z951 Presence of aortocoronary bypass graft: Secondary | ICD-10-CM

## 2018-01-27 DIAGNOSIS — I214 Non-ST elevation (NSTEMI) myocardial infarction: Secondary | ICD-10-CM

## 2018-01-27 NOTE — Telephone Encounter (Signed)
Per progress note on 01/25/18:  Clinical Social Worker facilitated patient discharge including contacting patient family and facility to confirm patient discharge plans.  Clinical information faxed to facility and family agreeable with plan.  CSW arranged ambulance transport via [facility] to [facility] .   TCM call not needed as the pt was discharged to another facility.

## 2018-01-28 ENCOUNTER — Emergency Department (HOSPITAL_COMMUNITY): Payer: Medicare Other

## 2018-01-28 ENCOUNTER — Telehealth (HOSPITAL_COMMUNITY): Payer: Self-pay

## 2018-01-28 ENCOUNTER — Emergency Department (HOSPITAL_COMMUNITY)
Admission: EM | Admit: 2018-01-28 | Discharge: 2018-01-29 | Disposition: A | Discharge status: Home / Self Care | Payer: Medicare Other | Attending: Emergency Medicine | Admitting: Emergency Medicine

## 2018-01-28 ENCOUNTER — Encounter (HOSPITAL_COMMUNITY): Payer: Self-pay | Admitting: Emergency Medicine

## 2018-01-28 DIAGNOSIS — I2510 Atherosclerotic heart disease of native coronary artery without angina pectoris: Secondary | ICD-10-CM | POA: Diagnosis not present

## 2018-01-28 DIAGNOSIS — J90 Pleural effusion, not elsewhere classified: Secondary | ICD-10-CM | POA: Diagnosis not present

## 2018-01-28 DIAGNOSIS — Z7982 Long term (current) use of aspirin: Secondary | ICD-10-CM | POA: Insufficient documentation

## 2018-01-28 DIAGNOSIS — E119 Type 2 diabetes mellitus without complications: Secondary | ICD-10-CM | POA: Diagnosis not present

## 2018-01-28 DIAGNOSIS — Z794 Long term (current) use of insulin: Secondary | ICD-10-CM | POA: Insufficient documentation

## 2018-01-28 DIAGNOSIS — Z79899 Other long term (current) drug therapy: Secondary | ICD-10-CM | POA: Insufficient documentation

## 2018-01-28 DIAGNOSIS — I10 Essential (primary) hypertension: Secondary | ICD-10-CM | POA: Diagnosis not present

## 2018-01-28 DIAGNOSIS — R61 Generalized hyperhidrosis: Secondary | ICD-10-CM | POA: Diagnosis not present

## 2018-01-28 DIAGNOSIS — K3184 Gastroparesis: Secondary | ICD-10-CM | POA: Insufficient documentation

## 2018-01-28 DIAGNOSIS — R110 Nausea: Secondary | ICD-10-CM

## 2018-01-28 DIAGNOSIS — R112 Nausea with vomiting, unspecified: Secondary | ICD-10-CM | POA: Diagnosis not present

## 2018-01-28 DIAGNOSIS — Z951 Presence of aortocoronary bypass graft: Secondary | ICD-10-CM | POA: Diagnosis not present

## 2018-01-28 LAB — I-STAT TROPONIN, ED: Troponin i, poc: 0 ng/mL (ref 0.00–0.08)

## 2018-01-28 LAB — I-STAT CHEM 8, ED
BUN: 11 mg/dL (ref 6–20)
CHLORIDE: 108 mmol/L (ref 98–111)
Calcium, Ion: 0.84 mmol/L — CL (ref 1.15–1.40)
Creatinine, Ser: 1 mg/dL (ref 0.44–1.00)
GLUCOSE: 88 mg/dL (ref 70–99)
HCT: 34 % — ABNORMAL LOW (ref 36.0–46.0)
Hemoglobin: 11.6 g/dL — ABNORMAL LOW (ref 12.0–15.0)
POTASSIUM: 3.9 mmol/L (ref 3.5–5.1)
SODIUM: 135 mmol/L (ref 135–145)
TCO2: 25 mmol/L (ref 22–32)

## 2018-01-28 LAB — URINALYSIS, ROUTINE W REFLEX MICROSCOPIC
BILIRUBIN URINE: NEGATIVE
GLUCOSE, UA: NEGATIVE mg/dL
Hgb urine dipstick: NEGATIVE
KETONES UR: NEGATIVE mg/dL
LEUKOCYTES UA: NEGATIVE
Nitrite: NEGATIVE
PH: 6 (ref 5.0–8.0)
Protein, ur: NEGATIVE mg/dL
Specific Gravity, Urine: 1.006 (ref 1.005–1.030)

## 2018-01-28 LAB — CBG MONITORING, ED: GLUCOSE-CAPILLARY: 142 mg/dL — AB (ref 70–99)

## 2018-01-28 MED ORDER — METOCLOPRAMIDE HCL 5 MG/ML IJ SOLN
10.0000 mg | Freq: Once | INTRAMUSCULAR | Status: DC
Start: 2018-01-28 — End: 2018-01-28
  Filled 2018-01-28: qty 2

## 2018-01-28 MED ORDER — SODIUM CHLORIDE 0.9 % IV SOLN: Freq: Once | INTRAVENOUS | Status: DC

## 2018-01-28 MED ORDER — DIPHENHYDRAMINE HCL 25 MG PO CAPS
25.0000 mg | ORAL_CAPSULE | Freq: Once | ORAL | Status: AC
  Administered 2018-01-28: 25 mg via ORAL
  Filled 2018-01-28: qty 1

## 2018-01-28 MED ORDER — METOCLOPRAMIDE HCL 5 MG/ML IJ SOLN
10.0000 mg | Freq: Once | INTRAMUSCULAR | Status: AC
  Administered 2018-01-28: 10 mg via INTRAMUSCULAR
  Filled 2018-01-28: qty 2

## 2018-01-28 MED ORDER — DIPHENHYDRAMINE HCL 50 MG/ML IJ SOLN
25.0000 mg | Freq: Once | INTRAMUSCULAR | Status: DC
  Filled 2018-01-28: qty 1

## 2018-01-28 MED ORDER — METOCLOPRAMIDE HCL 10 MG PO TABS: 10.0000 mg | ORAL_TABLET | Freq: Four times a day (QID) | ORAL | 0 refills | Status: DC | PRN

## 2018-01-28 NOTE — ED Triage Notes (Signed)
Per [facility] pt coming from [place] for nausea, vomiting and diaphoretic. Patient had triple bypass approximately two weeks ago. Patient was given Zofran by staff and has not had any episodes since. Denies any chest pain.

## 2018-01-28 NOTE — ED Provider Notes (Signed)
[facility] EMERGENCY DEPARTMENT Provider Note   CSN: [id number] Arrival date & time:        History   Chief Complaint Chief Complaint  Patient presents with  . Nausea    HPI [name] is a 52 y.o. female.   [name] is a 52 y.o. Female with a history of diabetes, hypertension, hyperlipidemia, gastroparesis, frequent DKA, CAD S/PE recent CABG on 7/23, [facility] via EMS from [facility] for evaluation of nausea and one episode of vomiting, as well as diaphoresis.  Patient reports the symptoms seem typical of her gastroparesis which she has a long history of, she was given Zofran at the facility with some improvement, she reports Reglan often helps as well.  But patient and facility staff were concerned because patient also had similar prodrome of symptoms before her recent MI that led to her triple bypass 2 weeks ago, patient wanted to be checked out.  She denies chest pain or shortness of breath.  She reports she has had an intermittent cough since surgery which has not been worsening and she has not had any fevers at the facility.  She reports some mild epigastric discomfort this morning that has completely resolved, she has not had any episodes of emesis since arrival here.  No diarrhea, melena or hematochezia.  Patient denies urinary symptoms.  Her sugar has been under good control while staying at rehab facility.  Patient denies any headache, numbness, weakness or dizziness.  No vision changes.     Past Medical History:  Diagnosis Date  . Depression   . Diabetes mellitus without complication ([place])   . Essential hypertension   . Gastroparesis   . GERD (gastroesophageal reflux disease)   . HLD (hyperlipidemia)     Patient Active Problem List   Diagnosis Date Noted  . S/P CABG x 3 01/14/2018  . CAD (coronary artery disease), native coronary artery 01/11/2018  . Non-ST elevation (NSTEMI) myocardial infarction ([place])   . MDD (major depressive disorder),  single episode, severe , no psychosis ([place])   . DKA (diabetic ketoacidoses) ([place]) 06/16/2017  . Tardive dyskinesia   . DKA, type 1 ([place]) 03/18/2016  . Noncompliance with treatment plan 03/13/2016  . Essential hypertension 01/24/2016  . HLD (hyperlipidemia)   . GERD (gastroesophageal reflux disease)   . AKI (acute kidney injury) ([place])   . Anemia, iron deficiency   . Vitamin B12 deficiency 08/16/2015  . Diabetic gastroparesis ([place])   . Diabetic neuropathy, type I diabetes mellitus ([place]) 05/18/2015  . Anxiety and depression 05/18/2015    Past Surgical History:  Procedure Laterality Date  . COLONOSCOPY  09/27/2014   at [facility]  . CORONARY ARTERY BYPASS GRAFT N/A 01/14/2018   Procedure: CORONARY ARTERY BYPASS GRAFTING (CABG)X3, RIGHT AND LEFT SAPHENOUS VEIN HARVEST, MAMMARY TAKE DOWN. MAMMARY TO LAD, SVG TO PD, SVG TO DISTAL CIRC.;  Surgeon: Grace Isaac, MD;  Location: [facility];  Service: Open Heart Surgery;  Laterality: N/A;  . ESOPHAGOGASTRODUODENOSCOPY  09/27/2014   at [facility], Dr Rolan Lipa. biospy neg for celiac, neg for H pylori.   . EYE SURGERY    . gailstones    . IR FLUORO GUIDE CV LINE RIGHT  02/01/2017  . IR FLUORO GUIDE CV LINE RIGHT  03/06/2017  . IR FLUORO GUIDE CV LINE RIGHT  03/25/2017  . IR GENERIC HISTORICAL  01/24/2016   IR FLUORO GUIDE CV LINE RIGHT 01/24/2016 Darrell K Allred, PA-C [facility]  . IR GENERIC HISTORICAL  01/24/2016   IR US GUIDE VASC ACCESS RIGHT 01/24/2016 Darrell K Allred, PA-C [facility]  . IR US GUIDE VASC ACCESS RIGHT  02/01/2017  . IR US GUIDE VASC ACCESS RIGHT  03/06/2017  . IR US GUIDE VASC ACCESS RIGHT  03/25/2017  . LEFT HEART CATH AND CORONARY ANGIOGRAPHY N/A 01/07/2018   Procedure: LEFT HEART CATH AND CORONARY ANGIOGRAPHY;  Surgeon: Troy Sine, MD;  Location: [facility];  Service: Cardiovascular;  Laterality: N/A;  . POSTERIOR VITRECTOMY AND MEMBRANE PEEL-LEFT EYE  09/28/2002  . POSTERIOR VITRECTOMY AND MEMBRANE PEEL-RIGHT EYE   03/16/2002  . RETINAL DETACHMENT SURGERY    . TEE WITHOUT CARDIOVERSION N/A 01/14/2018   Procedure: TRANSESOPHAGEAL ECHOCARDIOGRAM (TEE);  Surgeon: Grace Isaac, MD;  Location: [facility];  Service: Open Heart Surgery;  Laterality: N/A;     OB History   None      Home Medications    Prior to Admission medications   Medication Sig Start Date End Date Taking? Authorizing Provider  acetaminophen (TYLENOL) 325 MG tablet Take 2 tablets (650 mg total) by mouth every 6 (six) hours as needed for mild pain. 01/25/18   Elgie Collard, PA-C  aspirin EC 325 MG EC tablet Take 1 tablet (325 mg total) by mouth daily. 01/25/18   Elgie Collard, PA-C  atorvastatin (LIPITOR) 80 MG tablet Take 1 tablet (80 mg total) by mouth daily at 6 PM. 01/25/18   Elgie Collard, PA-C  bisacodyl (DULCOLAX) 5 MG EC tablet Take 5 mg by mouth daily as needed for moderate constipation.    [provider]  busPIRone (BUSPAR) 10 MG tablet Take 1 tablet (10 mg total) by mouth 3 (three) times daily. 11/28/17   [name], MD  fluticasone (FLONASE) 50 MCG/ACT nasal spray Place 2 sprays into both nostrils daily. Patient not taking: Reported on 01/04/2018 11/12/17   Eugenie Filler, MD  furosemide (LASIX) 20 MG tablet Take 1 tablet (20 mg total) by mouth daily. 01/25/18   Elgie Collard, PA-C  gabapentin (NEURONTIN) 300 MG capsule Take 1 capsule (300 mg total) by mouth 2 (two) times daily. 01/25/18   Elgie Collard, PA-C  guaiFENesin (MUCINEX) 600 MG 12 hr tablet Take 2 tablets (1,200 mg total) by mouth 2 (two) times daily for 7 days. 01/25/18 02/01/18  Elgie Collard, PA-C  insulin aspart (NOVOLOG) 100 UNIT/ML injection Inject 0-12 Units into the skin 4 (four) times daily -  before meals and at bedtime. Uses a sliding scale: 70-119 = 0 units, 120-150 = 2 units, 151-180 =  4 units, 181-210 = 5 units, 211-240 = 6 units, 241-270 = 8 units, 271-300 = 9 units, 301-330 = 10 units, 331-360 = 11 units, 361-400 = 12 units, greater than 400  notify MD.    [provider]  insulin detemir (LEVEMIR) 100 UNIT/ML injection Inject 0.15 mLs (15 Units total) into the skin every morning. 11/28/17   [name], MD  metoprolol tartrate (LOPRESSOR) 25 MG tablet Take 0.5 tablets (12.5 mg total) by mouth 2 (two) times daily. 01/25/18   Elgie Collard, PA-C  ondansetron (ZOFRAN ODT) 4 MG disintegrating tablet Take 1 tablet (4 mg total) by mouth every 8 (eight) hours as needed for nausea or vomiting. 12/20/17   Varney Biles, MD  pantoprazole (PROTONIX) 40 MG tablet Take 1 tablet (40 mg total) by mouth daily. 11/28/17   [name], MD  polyethylene glycol (MIRALAX / GLYCOLAX) packet Take 17 g  by mouth daily as needed for mild constipation.  04/11/17   [provider]  potassium chloride (K-DUR,KLOR-CON) 10 MEQ tablet Take 1 tablet (10 mEq total) by mouth daily. 01/25/18   Elgie Collard, PA-C  promethazine (PHENERGAN) 12.5 MG tablet Take 1 tablet (12.5 mg total) by mouth every 6 (six) hours as needed for nausea or vomiting. 01/25/18   Elgie Collard, PA-C  pyridostigmine (MESTINON) 60 MG tablet Take 0.5 tablets (30 mg total) by mouth 3 (three) times daily. Take 1/2 with meals 11/11/17   Eugenie Filler, MD  Syringe/Needle, Disp, (SYRINGE 3CC/22GX1-1/2") 22G X 1-1/2" 3 ML MISC Inject 1,000 mcg into the muscle daily. 11/11/17   Eugenie Filler, MD    Family History Family History  Problem Relation Age of Onset  . Cystic fibrosis Mother   . Hypertension Father   . Diabetes Brother   . Hypertension Maternal Grandmother     Social History Social History   Tobacco Use  . Smoking status: Never Smoker  . Smokeless tobacco: Never Used  Substance Use Topics  . Alcohol use: No  . Drug use: No     Allergies   Anesthetics, amide; Penicillins; Buprenorphine hcl; Encainide; and Metoclopramide   Review of Systems Review of Systems  Constitutional: Negative for chills and fever.  HENT: Negative.   Eyes: Negative for  visual disturbance.  Respiratory: Negative for cough and shortness of breath.   Cardiovascular: Negative for chest pain.  Gastrointestinal: Positive for nausea and vomiting. Negative for abdominal pain, blood in stool, constipation and diarrhea.  Genitourinary: Negative for dysuria and frequency.  Musculoskeletal: Negative for arthralgias and myalgias.  Skin: Negative for color change and rash.  Neurological: Negative for dizziness, syncope, weakness and light-headedness.     Physical Exam Updated Vital Signs BP 126/68   Pulse 77   Temp 98.6 F (37 C) (Oral)   Resp 10   SpO2 100%   Physical Exam  Constitutional: She is oriented to person, place, and time. She appears well-developed and well-nourished. She does not appear ill. No distress.  HENT:  Head: Normocephalic and atraumatic.  Mouth/Throat: Oropharynx is clear and moist.  Eyes: Pupils are equal, round, and reactive to light. EOM are normal. Right eye exhibits no discharge. Left eye exhibits no discharge.  Neck: Neck supple.  Cardiovascular: Normal rate, regular rhythm, normal heart sounds and intact distal pulses. Exam reveals no gallop and no friction rub.  No murmur heard. Pulmonary/Chest: Effort normal and breath sounds normal. No respiratory distress.  Respirations equal and unlabored, patient able to speak in full sentences, lungs clear to auscultation bilaterally, bandage over sternotomy is present and appears clean, no surrounding erythema  Abdominal: Soft. Bowel sounds are normal. She exhibits no distension and no mass. There is no tenderness. There is no guarding.  Abdomen soft, nondistended, nontender to palpation in all quadrants without guarding or peritoneal signs, no CVA tenderness bilaterally.  Musculoskeletal: She exhibits no edema or deformity.  Neurological: She is alert and oriented to person, place, and time. Coordination normal.  Skin: Skin is warm and dry. Capillary refill takes less than 2 seconds. She  is not diaphoretic.  Psychiatric: She has a normal mood and affect. Her behavior is normal.  Nursing note and vitals reviewed.    ED Treatments / Results  Labs (all labs ordered are listed, but only abnormal results are displayed) Labs Reviewed  CBG MONITORING, ED - Abnormal; Notable for the following components:  Result Value   Glucose-Capillary 142 (*)    All other components within normal limits  I-STAT CHEM 8, ED - Abnormal; Notable for the following components:   Calcium, Ion 0.84 (*)    Hemoglobin 11.6 (*)    HCT 34.0 (*)    All other components within normal limits  URINALYSIS, ROUTINE W REFLEX MICROSCOPIC  I-STAT TROPONIN, ED    EKG EKG Interpretation  Date/Time:  Tuesday January 28 2018 14:28:10 EDT Ventricular Rate:  79 PR Interval:    QRS Duration: 96 QT Interval:  405 QTC Calculation: 465 R Axis:   -2 Text Interpretation:  Sinus rhythm Low voltage, precordial leads Nonspecific T abnrm, anterolateral leads no significant change since Jan 24 2018 Confirmed by Sherwood Gambler [id number]) on 01/28/2018 2:57:34 PM   Radiology Dg Chest 2 View  Result Date: 01/28/2018 CLINICAL DATA:  Nausea, vomiting and diaphoresis. CABG approximately 2 weeks ago. EXAM: CHEST - 2 VIEW COMPARISON:  01/24/2018 FINDINGS: The patient is status post CABG with mild cardiomegaly, stable in appearance. Hazy opacities are noted at the lung bases left greater than right compatible with atelectasis and/or pneumonia. Posterior small pleural effusions are identified. Improved aeration at the right lung base since prior. No acute osseous abnormality. PICC line has been removed in the interim. IMPRESSION: Persistent left basilar opacity suspicious for pneumonia and/or atelectasis. Posterior small pleural effusions are present as before. Improved aeration of the right lung base since the interim. Electronically Signed   By: Ashley Royalty M.D.   On: 01/28/2018 18:48    Procedures Procedures (including  critical care time)  Medications Ordered in ED Medications  metoCLOPramide (REGLAN) injection 10 mg (10 mg Intramuscular Given 01/28/18 2042)  diphenhydrAMINE (BENADRYL) capsule 25 mg (25 mg Oral Given 01/28/18 2042)     Initial Impression / Assessment and Plan / ED Course  I have reviewed the triage vital signs and the nursing notes.  Pertinent labs & imaging results that were available during my care of the patient were reviewed by me and considered in my medical decision making (see chart for details).  She presents from rehab facility for evaluation of nausea, vomiting and diaphoresis.  Symptoms typical of her usual gastroparesis, and did improve some with Zofran at facility, but patient reports similar prodrome with her recent MI that resulted in triple-vessel bypass.  She has not had any chest pain or shortness of breath, no lightheadedness or syncope.  Patient reports she has been covering well from her surgery.  On arrival she has normal vitals and appears to be in no acute distress, no active vomiting since arriving here in the ED.  Heart with regular rate and rhythm, sternotomy site intact and clean, lungs clear to auscultation.  Abdomen is benign.  Will plan for light maintenance fluids, Reglan and labs to check patient's blood sugar as she has history of frequent DKA, abdominal labs and troponin, will also get chest x-ray.  Patient's EKG shows no concerning ischemic changes.  Labs and IV delayed as patient is a very difficult access, multiple nurses, IV team and Dr. Regenia Skeeter attempted venous access without success.  Respiratory therapy consulted for arterial stick to obtain Chem-8 and i-STAT troponin.  Patient has remained asymptomatic with normal vitals here in the ED I suspect this is more likely her gastroparesis.  Will give IM Reglan.  Negative troponin.  Negative troponin, i-STAT Chem-8 shows no evidence of acute electrolyte derangements, hemoglobin is stable.  Normal renal function.  CXR with some right  basilar opacity concerning for atelectasis versus pneumonia. Pt has not had productive cough or fever, denies shortness of breath and has not been hypoxic here.  On review of previous chest x-rays patient had bibasilar opacities, and this actually appears to be improved and I favor this being atelectasis in the postsurgical setting. Discussed with pt and will have her monitor closely and return for any fever, productive cough, shortness of breath.   At This time I feel patient is stable for discharge back to her rehab facility with continued treatment of her gastroparesis with Zofran and Reglan as needed.  I have discussed her strict return precautions with the patient and she expresses understanding and is in agreement with this plan.  Case discussed with Dr. Regenia Skeeter, who is in agreement with plan.  Final Clinical Impressions(s) / ED Diagnoses   Final diagnoses:  Nausea  Gastroparesis    ED Discharge Orders        Ordered    metoCLOPramide (REGLAN) 10 MG tablet  Every 6 hours PRN     01/28/18 2039      Jacqlyn Larsen, PA-C 01/29/18 Massie Kluver  Sherwood Gambler, MD 01/30/18 2358

## 2018-01-28 NOTE — Discharge Instructions (Signed)
Your EKG and labs do not suggest this is another problem with your heart, I think your symptoms are likely due to your chronic gastroparesis, continue with Zofran and/or Reglan as needed.  Return to the emergency department for any chest pain, shortness of breath, fevers, difficulty breathing, if you feel that you are going to pass out or any other new or concerning symptoms.

## 2018-01-28 NOTE — ED Notes (Signed)
[facility] called for transport.  

## 2018-01-28 NOTE — Telephone Encounter (Signed)
Patients insurance is active and benefits verified through Medicare A/B - No co-pay, deductible amount of $185.00/$185.00 has been met, no out of pocket, 20% co-insurance, and no pre-authorization is required. Passport/reference [phone]  Will contact patient to see if she is interested in the Cardiac Rehab Program. If interested, patient will need to complete follow up appt. Once completed, patient will be contacted for scheduling.

## 2018-01-28 NOTE — ED Notes (Signed)
Running I-Stat Trop now.

## 2018-01-28 NOTE — ED Notes (Signed)
IV team unsuccessful at IV access or lab draw. PA notified.

## 2018-01-28 NOTE — Telephone Encounter (Signed)
Attempted to make initial call to patient in regards to Cardiac Rehab - lm on vm °

## 2018-01-28 NOTE — ED Notes (Signed)
Unable to obtain lab work or IV access. IV team consult placed.

## 2018-01-29 DIAGNOSIS — G249 Dystonia, unspecified: Secondary | ICD-10-CM | POA: Diagnosis not present

## 2018-01-29 DIAGNOSIS — K3184 Gastroparesis: Secondary | ICD-10-CM | POA: Diagnosis not present

## 2018-01-30 ENCOUNTER — Other Ambulatory Visit: Payer: Self-pay | Admitting: Cardiovascular Disease

## 2018-01-31 ENCOUNTER — Other Ambulatory Visit: Payer: Self-pay | Admitting: Nurse Practitioner

## 2018-01-31 DIAGNOSIS — J309 Allergic rhinitis, unspecified: Secondary | ICD-10-CM | POA: Diagnosis not present

## 2018-01-31 NOTE — Progress Notes (Signed)
amb  

## 2018-02-03 ENCOUNTER — Encounter: Payer: Self-pay | Admitting: Nurse Practitioner

## 2018-02-03 ENCOUNTER — Ambulatory Visit (INDEPENDENT_AMBULATORY_CARE_PROVIDER_SITE_OTHER): Payer: Medicare Other | Admitting: Nurse Practitioner

## 2018-02-03 VITALS — BP 118/60 | HR 72 | Ht 61.0 in | Wt 137.4 lb

## 2018-02-03 DIAGNOSIS — E1165 Type 2 diabetes mellitus with hyperglycemia: Secondary | ICD-10-CM | POA: Diagnosis not present

## 2018-02-03 DIAGNOSIS — I2510 Atherosclerotic heart disease of native coronary artery without angina pectoris: Secondary | ICD-10-CM | POA: Diagnosis not present

## 2018-02-03 DIAGNOSIS — Z951 Presence of aortocoronary bypass graft: Secondary | ICD-10-CM | POA: Diagnosis not present

## 2018-02-03 MED ORDER — DOCUSATE SODIUM 100 MG PO CAPS: 100.0000 mg | ORAL_CAPSULE | Freq: Every day | ORAL | 0 refills | Status: DC | PRN

## 2018-02-03 NOTE — Patient Instructions (Addendum)
We will be checking the following labs today - BMET and CBC   Medication Instructions:    Continue with your current medicines.   I am adding Colace to use as needed    Testing/Procedures To Be Arranged:  N/A  Follow-Up:   See Dr. Acie Fredrickson in 4 weeks  Referral to Dr. Dwyane Dee    Other Special Instructions:   Ok to shower - wash incisions with soap and water and pat dry.    If you need a refill on your cardiac medications before your next appointment, please call your pharmacy.   Call the [place] office at [phone] if you have any questions, problems or concerns.

## 2018-02-03 NOTE — Progress Notes (Signed)
CARDIOLOGY OFFICE NOTE  Date:  02/03/2018    [name] Date of Birth: 1965/09/22 Medical Record #[id number]  PCP:  [name], MD  Cardiologist:  Nahser (NEW)  Chief Complaint  Patient presents with  . Coronary Artery Disease    Post hospital visit - seen for Dr. Servando Snare    History of Present Illness: [name] is a 52 y.o. female who presents today for a post hospital visit. Seen for Dr. Acie Fredrickson.   She has a history of depression, DM with medicine non compliance, neuropathy and HLD. Previously without known CAD.   She presented to the ED at [facility] last month with complaints of abdominal discomfort, nausea, and back pain.  She has been to the ED over 50 times in the past year for DKA and issues with her gastroparesis.  Workup in the ED revealed the patient's blood glucose level to be 892.  Troponin level was obtained and was elevated a 2.37.  EKG did not show significant changes.  She was ruled in for acute MI and DKA.    She underwent cardiac catheterization and was found to have multivessel CAD and a reduced EF of 30-35%.  It was felt bypass surgery would be indicated.  She was evaluated by Dr. Servando Snare at which time her blood sugars were much better controlled.  He was in agreement she would require surgical intervention.  However, the patient initially refused surgery due to anxiousness and wish to think about it.  She ultimately decided to proceed with surgery.    She was taken to the operating room on 01/14/2018.  She underwent CABG x 3 utilizing LIMA to LAD, SVG to PDA, and SVG to OM.  She also underwent endoscopic harvest of greater saphenous vein from her right and left thighs.  She tolerated the procedure without difficulty and was taken to the SICU in stable condition.  She had volume overload and anemia. She was discharged on August 3rd to SNF for rehab.   Back in the ER 3 days later with nausea.   Comes in today. Here with her sister [name]. She is at [place]. Will be there til the 22nd. Will return back to her home with her sister - they live together. Getting some PT - needs to be doing steps - has 11 steps to get into their apartment. Slow progress. Some chest soreness - she is only using Tylenol. Pain meds aggravate her gastroparesis. Not short of breath. Has some swelling in her feet - sits with her legs down - not elevating. On low dose Lasix. Appetite ok. Bowels a little sluggish - would like a stool softener. Blood sugars very labile - apparently was to see Dr. Dwyane Dee but then ended up missing the visit due to the hospitalization. Has not been able to get a shower - just doing bed baths. No fever or chills. She does not drive - typically uses [facility].   Past Medical History:  Diagnosis Date  . Depression   . Diabetes mellitus without complication ([place])   . Essential hypertension   . Gastroparesis   . GERD (gastroesophageal reflux disease)   . HLD (hyperlipidemia)     Past Surgical History:  Procedure Laterality Date  . COLONOSCOPY  09/27/2014   at [facility]  . CORONARY ARTERY BYPASS GRAFT N/A 01/14/2018   Procedure: CORONARY ARTERY BYPASS GRAFTING (CABG)X3, RIGHT AND LEFT SAPHENOUS VEIN HARVEST, MAMMARY TAKE DOWN. MAMMARY TO LAD, SVG TO PD, SVG TO DISTAL  CIRC.;  Surgeon: Grace Isaac, MD;  Location: [facility];  Service: Open Heart Surgery;  Laterality: N/A;  . ESOPHAGOGASTRODUODENOSCOPY  09/27/2014   at [facility], Dr Rolan Lipa. biospy neg for celiac, neg for H pylori.   . EYE SURGERY    . gailstones    . IR FLUORO GUIDE CV LINE RIGHT  02/01/2017  . IR FLUORO GUIDE CV LINE RIGHT  03/06/2017  . IR FLUORO GUIDE CV LINE RIGHT  03/25/2017  . IR GENERIC HISTORICAL  01/24/2016   IR FLUORO GUIDE CV LINE RIGHT 01/24/2016 Darrell K Allred, PA-C [facility]  . IR GENERIC HISTORICAL  01/24/2016   IR US GUIDE VASC ACCESS RIGHT 01/24/2016 Darrell K Allred, PA-C [facility]  . IR US GUIDE VASC ACCESS RIGHT  02/01/2017  . IR US GUIDE VASC  ACCESS RIGHT  03/06/2017  . IR US GUIDE VASC ACCESS RIGHT  03/25/2017  . LEFT HEART CATH AND CORONARY ANGIOGRAPHY N/A 01/07/2018   Procedure: LEFT HEART CATH AND CORONARY ANGIOGRAPHY;  Surgeon: Troy Sine, MD;  Location: [facility];  Service: Cardiovascular;  Laterality: N/A;  . POSTERIOR VITRECTOMY AND MEMBRANE PEEL-LEFT EYE  09/28/2002  . POSTERIOR VITRECTOMY AND MEMBRANE PEEL-RIGHT EYE  03/16/2002  . RETINAL DETACHMENT SURGERY    . TEE WITHOUT CARDIOVERSION N/A 01/14/2018   Procedure: TRANSESOPHAGEAL ECHOCARDIOGRAM (TEE);  Surgeon: Grace Isaac, MD;  Location: [facility];  Service: Open Heart Surgery;  Laterality: N/A;     Medications: Current Meds  Medication Sig  . acetaminophen (TYLENOL) 325 MG tablet Take 2 tablets (650 mg total) by mouth every 6 (six) hours as needed for mild pain.  Marland Kitchen aspirin EC 325 MG EC tablet Take 1 tablet (325 mg total) by mouth daily.  Marland Kitchen atorvastatin (LIPITOR) 80 MG tablet Take 1 tablet (80 mg total) by mouth daily at 6 PM.  . bisacodyl (DULCOLAX) 5 MG EC tablet Take 5 mg by mouth daily as needed for moderate constipation.  . busPIRone (BUSPAR) 10 MG tablet Take 1 tablet (10 mg total) by mouth 3 (three) times daily.  . diphenhydrAMINE (BENADRYL) 25 mg capsule Take 25 mg by mouth every 6 (six) hours as needed for allergies.  . fluticasone (FLONASE) 50 MCG/ACT nasal spray Place 2 sprays into both nostrils as needed for rhinitis.  . furosemide (LASIX) 20 MG tablet Take 1 tablet (20 mg total) by mouth daily.  Marland Kitchen gabapentin (NEURONTIN) 600 MG tablet Take 600 mg by mouth 2 (two) times daily.  Marland Kitchen guaiFENesin (MUCINEX) 600 MG 12 hr tablet Take 600 mg by mouth 2 (two) times daily.  . insulin aspart (NOVOLOG) 100 UNIT/ML injection Inject 0-12 Units into the skin 4 (four) times daily -  before meals and at bedtime. Uses a sliding scale: 70-119 = 0 units, 120-150 = 2 units, 151-180 =  4 units, 181-210 = 5 units, 211-240 = 6 units, 241-270 = 8 units, 271-300 = 9 units,  301-330 = 10 units, 331-360 = 11 units, 361-400 = 12 units, greater than 400 notify MD.  . insulin detemir (LEVEMIR) 100 UNIT/ML injection Inject 0.15 mLs (15 Units total) into the skin every morning.  . metoCLOPramide (REGLAN) 10 MG tablet Take 1 tablet (10 mg total) by mouth every 6 (six) hours as needed for nausea (nausea/headache).  . metoprolol tartrate (LOPRESSOR) 25 MG tablet Take 0.5 tablets (12.5 mg total) by mouth 2 (two) times daily.  . ondansetron (ZOFRAN ODT) 4 MG disintegrating tablet Take 1 tablet (4 mg  total) by mouth every 8 (eight) hours as needed for nausea or vomiting.  . pantoprazole (PROTONIX) 40 MG tablet Take 1 tablet (40 mg total) by mouth daily.  . polyethylene glycol (MIRALAX / GLYCOLAX) packet Take 17 g by mouth daily as needed for mild constipation.   . potassium chloride (K-DUR,KLOR-CON) 10 MEQ tablet Take 1 tablet (10 mEq total) by mouth daily.  . promethazine (PHENERGAN) 12.5 MG tablet Take 1 tablet (12.5 mg total) by mouth every 6 (six) hours as needed for nausea or vomiting.  . pyridostigmine (MESTINON) 60 MG tablet Take 0.5 tablets (30 mg total) by mouth 3 (three) times daily. Take 1/2 with meals     Allergies: Allergies  Allergen Reactions  . Anesthetics, Amide Nausea And Vomiting  . Penicillins Diarrhea, Nausea And Vomiting and Other (See Comments)    Has patient had a PCN reaction causing immediate rash, facial/tongue/throat swelling, SOB or lightheadedness with hypotension: Yes Has patient had a PCN reaction causing severe rash involving mucus membranes or skin necrosis: No Has patient had a PCN reaction that required hospitalization No Has patient had a PCN reaction occurring within the last 10 years: Yes  If all of the above answers are "NO", then may proceed with Cephalosporin use.   . Buprenorphine Hcl Rash  . Encainide Nausea And Vomiting  . Metoclopramide Other (See Comments)    Dystonia, muscle rigidity.     Social History: The patient   reports that she has never smoked. She has never used smokeless tobacco. She reports that she does not drink alcohol or use drugs.   Family History: The patient's family history includes Cystic fibrosis in her mother; Diabetes in her brother; Hypertension in her father and maternal grandmother.   Review of Systems: Please see the history of present illness.   Otherwise, the review of systems is positive for none.   All other systems are reviewed and negative.   Physical Exam: VS:  BP 118/60 (BP Location: Left Arm, Patient Position: Sitting, Cuff Size: Normal)   Pulse 72   Ht 5\' 1"  (1.549 m)   Wt 137 lb 6.4 oz (62.3 kg)   BMI 25.96 kg/m  .  BMI Body mass index is 25.96 kg/m.  Wt Readings from Last 3 Encounters:  02/03/18 137 lb 6.4 oz (62.3 kg)  01/25/18 143 lb 4.8 oz (65 kg)  12/23/17 150 lb (68 kg)    General: She looks much older than her stated age. She is alert and in no acute distress.   HEENT: Normal.  Neck: Supple, no JVD, carotid bruits, or masses noted.  Cardiac: Regular rate and rhythm. No murmurs, rubs, or gallops. Sternum looks ok - some delayed healing but no signs of infection. Delayed healing of the chest tube site. Trace pedal edema.  Respiratory:  Lungs are clear to auscultation bilaterally with normal work of breathing.  GI: Soft and nontender.  MS: No deformity or atrophy. Gait not tested - she is in a wheelchair today.  Skin: Warm and dry. Color is normal.  Neuro:  Strength and sensation are intact and no gross focal deficits noted.  Psych: Alert, appropriate and with normal affect.   LABORATORY DATA:  EKG:  EKG is ordered today. This demonstrates NSR with diffuse nonspecific ST/T wave changes.  Lab Results  Component Value Date   WBC 8.9 01/24/2018   HGB 11.6 (L) 01/28/2018   HCT 34.0 (L) 01/28/2018   PLT 427 (H) 01/24/2018   GLUCOSE 88 01/28/2018  CHOL 121 01/07/2018   TRIG 101 01/07/2018   HDL 47 01/07/2018   LDLCALC 54 01/07/2018   ALT 67 (H)  01/20/2018   AST 97 (H) 01/20/2018   NA 135 01/28/2018   K 3.9 01/28/2018   CL 108 01/28/2018   CREATININE 1.00 01/28/2018   BUN 11 01/28/2018   CO2 25 01/24/2018   TSH 0.632 03/25/2017   INR 1.33 01/14/2018   HGBA1C 8.6 (H) 01/08/2018   MICROALBUR 24.6 09/04/2016     BNP (last 3 results) No results for input(s): BNP in the last 8760 hours.  ProBNP (last 3 results) No results for input(s): PROBNP in the last 8760 hours.   Other Studies Reviewed Today:  Coronary artery bypass grafting x3 with the left internal mammary to the left anterior descending coronary artery, reverse saphenous vein graft to the posterior descending coronary artery, reverse saphenous vein graft to the  circumflex coronary artery with bilateral thigh greater saphenous endovein harvesting by Dr. Servando Snare on 01/14/2018.    LEFT HEART CATH AND CORONARY ANGIOGRAPHY 12/2017  Conclusion     Ost Ramus to Ramus lesion is 70% stenosed.  Ost LAD lesion is 40% stenosed.  Prox LAD lesion is 50% stenosed.  Prox LAD to Mid LAD lesion is 70% stenosed.  Prox Cx to Mid Cx lesion is 70% stenosed.  Mid Cx to Dist Cx lesion is 60% stenosed.  Mid RCA lesion is 85% stenosed.  Prox RCA lesion is 20% stenosed.  Dist RCA lesion is 50% stenosed.  RPDA lesion is 70% stenosed.   Multi-vessel CAD in this diabetic female with diffuse disease and diabetic appearing vessels.  There is smooth 40% ostial LAD stenosis, diffuse 50% proximal LAD stenosis, significant eccentric calcification in the mid LAD after the diagonal vessel with narrowing of at least 70% and diffuse distal LAD narrowing; diffusely narrowed proximal to mid ramus intermediate vessel; large left circumflex coronary artery with 70% followed by 60% mid AV groove stenosis after the takeoff of the first marginal vessel; and small caliber RCA with diffuse disease with 20% proximal RCA narrowing,  focal 85% mid stenosis, diffuse 50% narrowing before the PDA  takeoff, 70% mid PDA stenosis.  Moderately severe global LV dysfunction with an ejection fraction of 30 to 35%.  LVEDP 23 mm.  RECOMMENDATION: Angiograms will be reviewed with colleagues.  Recommend surgical consultation for consideration of CABG revascularization in this 52 year old diabetic female with a 41-year history of diabetes mellitus.  The patient's tightest lesion is her mid RCA but this would not account for her troponin of 40.47.  Increased medical therapy.  Hydrate post catheterization.  Will reinstitute heparin therapy for anticoagulation 10 hours post sheath removal.      Echo Study Conclusions 12/2017  - Left ventricle: The cavity size was normal. Wall thickness was   normal. The estimated ejection fraction was 55%. There is   hypokinesis of the apicalinferior myocardium. Left ventricular   diastolic function parameters were normal. - Mitral valve: There was mild regurgitation. - Left atrium: The atrium was mildly dilated. - Right atrium: Central venous pressure (est): 8 mm Hg. - Tricuspid valve: There was trivial regurgitation. - Pulmonary arteries: Systolic pressure could not be accurately   estimated. - Pericardium, extracardiac: There was no pericardial effusion.  Assessment/Plan: 1. NSTEMI with severe CAD - now s/p CABG - slow progress. Currently in rehab at [facility] Lab today. Adding prn Colace. Ok to shower with assistance. Hope to send to cardiac rehab on return. Her overall  prognosis looks tenuous to me - hopefully compliance will not be an issue as it has been in the past. Seeing [facility]CTS later this month.   2. Uncontrolled DM - will refer to Dr. Dwyane Dee  3. HLD - on statin - will need lab on return  4. Gastroparesis  Current medicines are reviewed with the patient today.  The patient does not have concerns regarding medicines other than what has been noted above.  The following changes have been made:  See above.  Labs/ tests ordered today include:     Orders Placed This Encounter  Procedures  . Basic metabolic panel  . CBC  . Ambulatory referral to Endocrinology  . EKG 12-Lead     Disposition:   FU with Dr. Acie Fredrickson in 4 weeks with fasting labs.    Patient is agreeable to this plan and will call if any problems develop in the interim.   SignedTruitt Merle, NP  02/03/2018 12:00 PM  [facility] [address] [place], [place]  [address] Phone: [phone] Fax: [phone]

## 2018-02-04 ENCOUNTER — Telehealth: Payer: Self-pay | Admitting: *Deleted

## 2018-02-04 DIAGNOSIS — E109 Type 1 diabetes mellitus without complications: Secondary | ICD-10-CM | POA: Diagnosis not present

## 2018-02-04 DIAGNOSIS — R300 Dysuria: Secondary | ICD-10-CM | POA: Diagnosis not present

## 2018-02-04 DIAGNOSIS — Z951 Presence of aortocoronary bypass graft: Secondary | ICD-10-CM

## 2018-02-04 NOTE — Telephone Encounter (Signed)
S/w pt is aware to come in on Aug 29 for repeat bmet/cbc due to lab not being able to draw when pt was here. Pt  Will hydrate before coming in.  Will hold lasix the day before lab and day of.  appt made, orders in and linked.

## 2018-02-06 DIAGNOSIS — N390 Urinary tract infection, site not specified: Secondary | ICD-10-CM | POA: Diagnosis not present

## 2018-02-06 DIAGNOSIS — K5900 Constipation, unspecified: Secondary | ICD-10-CM | POA: Diagnosis not present

## 2018-02-11 DIAGNOSIS — Z951 Presence of aortocoronary bypass graft: Secondary | ICD-10-CM | POA: Diagnosis not present

## 2018-02-11 DIAGNOSIS — N390 Urinary tract infection, site not specified: Secondary | ICD-10-CM | POA: Diagnosis not present

## 2018-02-12 DIAGNOSIS — I2510 Atherosclerotic heart disease of native coronary artery without angina pectoris: Secondary | ICD-10-CM | POA: Diagnosis not present

## 2018-02-12 DIAGNOSIS — Z951 Presence of aortocoronary bypass graft: Secondary | ICD-10-CM | POA: Diagnosis not present

## 2018-02-12 DIAGNOSIS — G249 Dystonia, unspecified: Secondary | ICD-10-CM | POA: Diagnosis not present

## 2018-02-12 DIAGNOSIS — F419 Anxiety disorder, unspecified: Secondary | ICD-10-CM | POA: Diagnosis not present

## 2018-02-13 ENCOUNTER — Other Ambulatory Visit: Payer: Self-pay | Admitting: Cardiothoracic Surgery

## 2018-02-13 DIAGNOSIS — Z951 Presence of aortocoronary bypass graft: Secondary | ICD-10-CM

## 2018-02-14 ENCOUNTER — Encounter (HOSPITAL_COMMUNITY): Payer: Self-pay

## 2018-02-14 ENCOUNTER — Other Ambulatory Visit: Payer: Self-pay

## 2018-02-14 ENCOUNTER — Inpatient Hospital Stay (HOSPITAL_COMMUNITY)
Admission: EM | Admit: 2018-02-14 | Discharge: 2018-02-16 | DRG: 638 | Disposition: A | Discharge status: Home / Self Care | Payer: Medicare Other | Attending: Internal Medicine | Admitting: Internal Medicine

## 2018-02-14 ENCOUNTER — Emergency Department (HOSPITAL_COMMUNITY): Payer: Medicare Other

## 2018-02-14 DIAGNOSIS — Z9119 Patient's noncompliance with other medical treatment and regimen: Secondary | ICD-10-CM | POA: Diagnosis not present

## 2018-02-14 DIAGNOSIS — E1069 Type 1 diabetes mellitus with other specified complication: Secondary | ICD-10-CM | POA: Diagnosis present

## 2018-02-14 DIAGNOSIS — Z880 Allergy status to penicillin: Secondary | ICD-10-CM

## 2018-02-14 DIAGNOSIS — I10 Essential (primary) hypertension: Secondary | ICD-10-CM | POA: Diagnosis not present

## 2018-02-14 DIAGNOSIS — R1013 Epigastric pain: Secondary | ICD-10-CM | POA: Diagnosis not present

## 2018-02-14 DIAGNOSIS — D509 Iron deficiency anemia, unspecified: Secondary | ICD-10-CM | POA: Diagnosis not present

## 2018-02-14 DIAGNOSIS — I2510 Atherosclerotic heart disease of native coronary artery without angina pectoris: Secondary | ICD-10-CM | POA: Diagnosis present

## 2018-02-14 DIAGNOSIS — E1022 Type 1 diabetes mellitus with diabetic chronic kidney disease: Secondary | ICD-10-CM | POA: Diagnosis present

## 2018-02-14 DIAGNOSIS — R000 Tachycardia, unspecified: Secondary | ICD-10-CM | POA: Diagnosis not present

## 2018-02-14 DIAGNOSIS — N179 Acute kidney failure, unspecified: Secondary | ICD-10-CM | POA: Diagnosis present

## 2018-02-14 DIAGNOSIS — R52 Pain, unspecified: Secondary | ICD-10-CM | POA: Diagnosis not present

## 2018-02-14 DIAGNOSIS — Z9111 Patient's noncompliance with dietary regimen: Secondary | ICD-10-CM | POA: Diagnosis not present

## 2018-02-14 DIAGNOSIS — Z951 Presence of aortocoronary bypass graft: Secondary | ICD-10-CM

## 2018-02-14 DIAGNOSIS — E1040 Type 1 diabetes mellitus with diabetic neuropathy, unspecified: Secondary | ICD-10-CM | POA: Diagnosis present

## 2018-02-14 DIAGNOSIS — E1010 Type 1 diabetes mellitus with ketoacidosis without coma: Secondary | ICD-10-CM | POA: Diagnosis not present

## 2018-02-14 DIAGNOSIS — Z9114 Patient's other noncompliance with medication regimen: Secondary | ICD-10-CM | POA: Diagnosis not present

## 2018-02-14 DIAGNOSIS — Z884 Allergy status to anesthetic agent status: Secondary | ICD-10-CM

## 2018-02-14 DIAGNOSIS — Z79899 Other long term (current) drug therapy: Secondary | ICD-10-CM

## 2018-02-14 DIAGNOSIS — K219 Gastro-esophageal reflux disease without esophagitis: Secondary | ICD-10-CM | POA: Diagnosis present

## 2018-02-14 DIAGNOSIS — Z888 Allergy status to other drugs, medicaments and biological substances status: Secondary | ICD-10-CM | POA: Diagnosis not present

## 2018-02-14 DIAGNOSIS — F32A Depression, unspecified: Secondary | ICD-10-CM | POA: Diagnosis present

## 2018-02-14 DIAGNOSIS — E1165 Type 2 diabetes mellitus with hyperglycemia: Secondary | ICD-10-CM | POA: Diagnosis not present

## 2018-02-14 DIAGNOSIS — F419 Anxiety disorder, unspecified: Secondary | ICD-10-CM | POA: Diagnosis present

## 2018-02-14 DIAGNOSIS — I252 Old myocardial infarction: Secondary | ICD-10-CM | POA: Diagnosis not present

## 2018-02-14 DIAGNOSIS — Z794 Long term (current) use of insulin: Secondary | ICD-10-CM

## 2018-02-14 DIAGNOSIS — E538 Deficiency of other specified B group vitamins: Secondary | ICD-10-CM | POA: Diagnosis present

## 2018-02-14 DIAGNOSIS — R1084 Generalized abdominal pain: Secondary | ICD-10-CM | POA: Diagnosis not present

## 2018-02-14 DIAGNOSIS — N182 Chronic kidney disease, stage 2 (mild): Secondary | ICD-10-CM | POA: Diagnosis present

## 2018-02-14 DIAGNOSIS — E1043 Type 1 diabetes mellitus with diabetic autonomic (poly)neuropathy: Secondary | ICD-10-CM | POA: Diagnosis present

## 2018-02-14 DIAGNOSIS — R079 Chest pain, unspecified: Secondary | ICD-10-CM | POA: Diagnosis not present

## 2018-02-14 DIAGNOSIS — Z91199 Patient's noncompliance with other medical treatment and regimen due to unspecified reason: Secondary | ICD-10-CM

## 2018-02-14 DIAGNOSIS — E1143 Type 2 diabetes mellitus with diabetic autonomic (poly)neuropathy: Secondary | ICD-10-CM

## 2018-02-14 DIAGNOSIS — E860 Dehydration: Secondary | ICD-10-CM | POA: Diagnosis present

## 2018-02-14 DIAGNOSIS — E785 Hyperlipidemia, unspecified: Secondary | ICD-10-CM

## 2018-02-14 DIAGNOSIS — I129 Hypertensive chronic kidney disease with stage 1 through stage 4 chronic kidney disease, or unspecified chronic kidney disease: Secondary | ICD-10-CM | POA: Diagnosis present

## 2018-02-14 DIAGNOSIS — R1110 Vomiting, unspecified: Secondary | ICD-10-CM | POA: Diagnosis not present

## 2018-02-14 DIAGNOSIS — K3184 Gastroparesis: Secondary | ICD-10-CM | POA: Diagnosis present

## 2018-02-14 DIAGNOSIS — I214 Non-ST elevation (NSTEMI) myocardial infarction: Secondary | ICD-10-CM | POA: Diagnosis present

## 2018-02-14 DIAGNOSIS — F329 Major depressive disorder, single episode, unspecified: Secondary | ICD-10-CM | POA: Diagnosis present

## 2018-02-14 DIAGNOSIS — Z7982 Long term (current) use of aspirin: Secondary | ICD-10-CM | POA: Diagnosis not present

## 2018-02-14 DIAGNOSIS — R112 Nausea with vomiting, unspecified: Secondary | ICD-10-CM | POA: Diagnosis not present

## 2018-02-14 LAB — GLUCOSE, CAPILLARY
GLUCOSE-CAPILLARY: 217 mg/dL — AB (ref 70–99)
GLUCOSE-CAPILLARY: 125 mg/dL — AB (ref 70–99)
GLUCOSE-CAPILLARY: 128 mg/dL — AB (ref 70–99)
GLUCOSE-CAPILLARY: 136 mg/dL — AB (ref 70–99)
Glucose-Capillary: 162 mg/dL — ABNORMAL HIGH (ref 70–99)
Glucose-Capillary: 130 mg/dL — ABNORMAL HIGH (ref 70–99)
Glucose-Capillary: 172 mg/dL — ABNORMAL HIGH (ref 70–99)
Glucose-Capillary: 215 mg/dL — ABNORMAL HIGH (ref 70–99)

## 2018-02-14 LAB — COMPREHENSIVE METABOLIC PANEL
ALBUMIN: 3.9 g/dL (ref 3.5–5.0)
ALT: 47 U/L — ABNORMAL HIGH (ref 0–44)
ANION GAP: 17 — AB (ref 5–15)
AST: 38 U/L (ref 15–41)
Alkaline Phosphatase: 264 U/L — ABNORMAL HIGH (ref 38–126)
BUN: 17 mg/dL (ref 6–20)
CHLORIDE: 96 mmol/L — AB (ref 98–111)
CO2: 22 mmol/L (ref 22–32)
Calcium: 10.3 mg/dL (ref 8.9–10.3)
Creatinine, Ser: 1.48 mg/dL — ABNORMAL HIGH (ref 0.44–1.00)
GFR calc Af Amer: 47 mL/min — ABNORMAL LOW (ref >60)
GFR calc non Af Amer: 40 mL/min — ABNORMAL LOW (ref >60)
GLUCOSE: 474 mg/dL — AB (ref 70–99)
POTASSIUM: 4.5 mmol/L (ref 3.5–5.1)
Sodium: 135 mmol/L (ref 135–145)
Total Bilirubin: 1.7 mg/dL — ABNORMAL HIGH (ref 0.3–1.2)
Total Protein: 9 g/dL — ABNORMAL HIGH (ref 6.5–8.1)

## 2018-02-14 LAB — CBC WITH DIFFERENTIAL/PLATELET
Abs Immature Granulocytes: 0 10*3/uL (ref 0.0–0.1)
BASOS PCT: 1 %
Basophils Absolute: 0.1 10*3/uL (ref 0.0–0.1)
EOS ABS: 0.1 10*3/uL (ref 0.0–0.7)
Eosinophils Relative: 1 %
HCT: 42.9 % (ref 36.0–46.0)
Hemoglobin: 13.6 g/dL (ref 12.0–15.0)
IMMATURE GRANULOCYTES: 0 %
Lymphocytes Relative: 13 %
Lymphs Abs: 1.1 10*3/uL (ref 0.7–4.0)
MCH: 28.1 pg (ref 26.0–34.0)
MCHC: 31.7 g/dL (ref 30.0–36.0)
MCV: 88.6 fL (ref 78.0–100.0)
Monocytes Absolute: 0.5 10*3/uL (ref 0.1–1.0)
Monocytes Relative: 5 %
NEUTROS PCT: 80 %
Neutro Abs: 7.2 10*3/uL (ref 1.7–7.7)
PLATELETS: 321 10*3/uL (ref 150–400)
RBC: 4.84 MIL/uL (ref 3.87–5.11)
RDW: 14.2 % (ref 11.5–15.5)
WBC: 9 10*3/uL (ref 4.0–10.5)

## 2018-02-14 LAB — I-STAT VENOUS BLOOD GAS, ED
Acid-base deficit: 2 mmol/L (ref 0.0–2.0)
Bicarbonate: 23.7 mmol/L (ref 20.0–28.0)
O2 SAT: 81 %
PO2 VEN: 47 mmHg — AB (ref 32.0–45.0)
TCO2: 25 mmol/L (ref 22–32)
pCO2, Ven: 41.3 mmHg — ABNORMAL LOW (ref 44.0–60.0)
pH, Ven: 7.368 (ref 7.250–7.430)

## 2018-02-14 LAB — HCG, QUANTITATIVE, PREGNANCY: HCG, BETA CHAIN, QUANT, S: 4 m[IU]/mL (ref <5)

## 2018-02-14 LAB — I-STAT BETA HCG BLOOD, ED (MC, WL, AP ONLY): HCG, QUANTITATIVE: 11.2 m[IU]/mL — AB (ref <5)

## 2018-02-14 LAB — CBG MONITORING, ED
Glucose-Capillary: 469 mg/dL — ABNORMAL HIGH (ref 70–99)
Glucose-Capillary: 451 mg/dL — ABNORMAL HIGH (ref 70–99)
Glucose-Capillary: 392 mg/dL — ABNORMAL HIGH (ref 70–99)
Glucose-Capillary: 299 mg/dL — ABNORMAL HIGH (ref 70–99)

## 2018-02-14 LAB — LIPASE, BLOOD: Lipase: 44 U/L (ref 11–51)

## 2018-02-14 LAB — I-STAT TROPONIN, ED: Troponin i, poc: 0 ng/mL (ref 0.00–0.08)

## 2018-02-14 LAB — I-STAT CHEM 8, ED
BUN: 20 mg/dL (ref 6–20)
CALCIUM ION: 1.19 mmol/L (ref 1.15–1.40)
CHLORIDE: 98 mmol/L (ref 98–111)
Creatinine, Ser: 1.2 mg/dL — ABNORMAL HIGH (ref 0.44–1.00)
Glucose, Bld: 486 mg/dL — ABNORMAL HIGH (ref 70–99)
HCT: 46 % (ref 36.0–46.0)
Hemoglobin: 15.6 g/dL — ABNORMAL HIGH (ref 12.0–15.0)
POTASSIUM: 4.6 mmol/L (ref 3.5–5.1)
SODIUM: 133 mmol/L — AB (ref 135–145)
TCO2: 24 mmol/L (ref 22–32)

## 2018-02-14 MED ORDER — ONDANSETRON HCL 4 MG/2ML IJ SOLN
4.0000 mg | INTRAMUSCULAR | Status: AC
  Administered 2018-02-14: 4 mg via INTRAVENOUS

## 2018-02-14 MED ORDER — SODIUM CHLORIDE 0.9 % IV SOLN
INTRAVENOUS | Status: DC
  Administered 2018-02-14: 6.6 [IU]/h via INTRAVENOUS
  Filled 2018-02-14: qty 1

## 2018-02-14 MED ORDER — ONDANSETRON HCL 4 MG PO TABS: 4.0000 mg | ORAL_TABLET | Freq: Four times a day (QID) | ORAL | Status: DC | PRN

## 2018-02-14 MED ORDER — ACETAMINOPHEN 650 MG RE SUPP: 650.0000 mg | Freq: Four times a day (QID) | RECTAL | Status: DC | PRN

## 2018-02-14 MED ORDER — ENOXAPARIN SODIUM 40 MG/0.4ML ~~LOC~~ SOLN
40.0000 mg | SUBCUTANEOUS | Status: DC
  Administered 2018-02-14 – 2018-02-15 (×2): 40 mg via SUBCUTANEOUS
  Filled 2018-02-14 (×2): qty 0.4

## 2018-02-14 MED ORDER — SODIUM CHLORIDE 0.9 % IV SOLN
INTRAVENOUS | Status: AC
  Administered 2018-02-14: 15:00:00 via INTRAVENOUS

## 2018-02-14 MED ORDER — SODIUM CHLORIDE 0.9 % IV BOLUS
1000.0000 mL | Freq: Once | INTRAVENOUS | Status: AC
  Administered 2018-02-14: 1000 mL via INTRAVENOUS

## 2018-02-14 MED ORDER — DEXTROSE-NACL 5-0.45 % IV SOLN
INTRAVENOUS | Status: DC
  Administered 2018-02-14: 18:00:00 via INTRAVENOUS

## 2018-02-14 MED ORDER — BISACODYL 10 MG RE SUPP: 10.0000 mg | Freq: Every day | RECTAL | Status: DC | PRN

## 2018-02-14 MED ORDER — ONDANSETRON HCL 4 MG/2ML IJ SOLN
4.0000 mg | Freq: Four times a day (QID) | INTRAMUSCULAR | Status: DC | PRN
  Administered 2018-02-14 – 2018-02-15 (×2): 4 mg via INTRAVENOUS
  Filled 2018-02-14 (×3): qty 2

## 2018-02-14 MED ORDER — SODIUM CHLORIDE 0.9 % IV SOLN
INTRAVENOUS | Status: DC
  Administered 2018-02-14: 3.9 [IU]/h via INTRAVENOUS
  Filled 2018-02-14: qty 1

## 2018-02-14 MED ORDER — HYDROMORPHONE HCL 1 MG/ML IJ SOLN
1.0000 mg | Freq: Once | INTRAMUSCULAR | Status: AC
  Administered 2018-02-14: 1 mg via INTRAVENOUS
  Filled 2018-02-14: qty 1

## 2018-02-14 MED ORDER — SODIUM CHLORIDE 0.9 % IV SOLN
INTRAVENOUS | Status: DC
  Administered 2018-02-14: 15:00:00 via INTRAVENOUS

## 2018-02-14 MED ORDER — POTASSIUM CHLORIDE 10 MEQ/100ML IV SOLN
10.0000 meq | INTRAVENOUS | Status: AC
  Filled 2018-02-14: qty 100

## 2018-02-14 MED ORDER — SENNOSIDES-DOCUSATE SODIUM 8.6-50 MG PO TABS: 1.0000 | ORAL_TABLET | Freq: Every evening | ORAL | Status: DC | PRN

## 2018-02-14 MED ORDER — HYDRALAZINE HCL 20 MG/ML IJ SOLN: 10.0000 mg | Freq: Once | INTRAMUSCULAR | Status: DC

## 2018-02-14 MED ORDER — PROMETHAZINE HCL 25 MG/ML IJ SOLN
25.0000 mg | Freq: Once | INTRAMUSCULAR | Status: AC
  Administered 2018-02-14: 25 mg via INTRAVENOUS
  Filled 2018-02-14: qty 1

## 2018-02-14 MED ORDER — POTASSIUM CHLORIDE 10 MEQ/100ML IV SOLN: 10.0000 meq | INTRAVENOUS | Status: AC

## 2018-02-14 MED ORDER — METOCLOPRAMIDE HCL 5 MG/ML IJ SOLN
10.0000 mg | Freq: Once | INTRAMUSCULAR | Status: AC
  Administered 2018-02-14: 10 mg via INTRAVENOUS
  Filled 2018-02-14: qty 2

## 2018-02-14 MED ORDER — DEXTROSE-NACL 5-0.45 % IV SOLN: INTRAVENOUS | Status: DC

## 2018-02-14 MED ORDER — ACETAMINOPHEN 325 MG PO TABS
650.0000 mg | ORAL_TABLET | Freq: Four times a day (QID) | ORAL | Status: DC | PRN
  Administered 2018-02-15 – 2018-02-16 (×2): 650 mg via ORAL
  Filled 2018-02-14 (×2): qty 2

## 2018-02-14 NOTE — ED Provider Notes (Signed)
[facility] Provider Note   CSN: [id number] Arrival date & time: 02/14/18  1146     History   Chief Complaint Chief Complaint  Patient presents with  . Abdominal Pain  . Nausea    HPI Masiel Bok is a 52 y.o. female history of recurrent DKA from uncontrolled diabetes, CAD status post CABG, gastroparesis here presenting with vomiting, epigastric pain, chest pain.  Patient just had CABG about a month ago.  Patient started having epigastric pain since yesterday.  Had persistent vomiting that is nonbilious and nonbloody.  EMS was called this morning due to diaphoresis and vomiting.  No CBG or IV access obtained prior to arrival.  No meds prior to arrival.   The history is provided by the patient and the EMS personnel.    Past Medical History:  Diagnosis Date  . AKI (acute kidney injury) ([place])   . Anemia, iron deficiency   . Anxiety and depression 05/18/2015  . CAD (coronary artery disease), native coronary artery 01/11/2018   Non-STEMI with severe three-vessel disease noted July 2019  . Depression   . Diabetes mellitus without complication ([place])   . Diabetic gastroparesis ([place])   . Diabetic neuropathy, type I diabetes mellitus ([place]) 05/18/2015  . DKA (diabetic ketoacidoses) ([place]) 06/16/2017  . DKA, type 1 ([place]) 03/18/2016  . Essential hypertension   . Gastroparesis   . GERD (gastroesophageal reflux disease)   . HLD (hyperlipidemia)   . MDD (major depressive disorder), single episode, severe , no psychosis ([place])   . Non-ST elevation (NSTEMI) myocardial infarction ([place])   . S/P CABG x 3 01/14/2018  . Tardive dyskinesia   . Vitamin B12 deficiency 08/16/2015    Patient Active Problem List   Diagnosis Date Noted  . S/P CABG x 3 01/14/2018  . CAD (coronary artery disease), native coronary artery 01/11/2018  . Non-ST elevation (NSTEMI) myocardial infarction ([place])   . MDD (major depressive disorder), single episode, severe , no psychosis  ([place])   . DKA (diabetic ketoacidoses) ([place]) 06/16/2017  . Tardive dyskinesia   . DKA, type 1 ([place]) 03/18/2016  . Noncompliance with treatment plan 03/13/2016  . Essential hypertension 01/24/2016  . HLD (hyperlipidemia)   . GERD (gastroesophageal reflux disease)   . AKI (acute kidney injury) ([place])   . Anemia, iron deficiency   . Vitamin B12 deficiency 08/16/2015  . Diabetic gastroparesis ([place])   . Diabetic neuropathy, type I diabetes mellitus ([place]) 05/18/2015  . Anxiety and depression 05/18/2015    Past Surgical History:  Procedure Laterality Date  . COLONOSCOPY  09/27/2014   at [facility]  . CORONARY ARTERY BYPASS GRAFT N/A 01/14/2018   Procedure: CORONARY ARTERY BYPASS GRAFTING (CABG)X3, RIGHT AND LEFT SAPHENOUS VEIN HARVEST, MAMMARY TAKE DOWN. MAMMARY TO LAD, SVG TO PD, SVG TO DISTAL CIRC.;  Surgeon: Grace Isaac, MD;  Location: [facility];  Service: Open Heart Surgery;  Laterality: N/A;  . ESOPHAGOGASTRODUODENOSCOPY  09/27/2014   at [facility], Dr Rolan Lipa. biospy neg for celiac, neg for H pylori.   . EYE SURGERY    . gailstones    . IR FLUORO GUIDE CV LINE RIGHT  02/01/2017  . IR FLUORO GUIDE CV LINE RIGHT  03/06/2017  . IR FLUORO GUIDE CV LINE RIGHT  03/25/2017  . IR GENERIC HISTORICAL  01/24/2016   IR FLUORO GUIDE CV LINE RIGHT 01/24/2016 Darrell K Allred, PA-C [facility]  . IR GENERIC HISTORICAL  01/24/2016   IR US GUIDE VASC ACCESS RIGHT  01/24/2016 Darrell K Allred, PA-C [facility]  . IR US GUIDE VASC ACCESS RIGHT  02/01/2017  . IR US GUIDE VASC ACCESS RIGHT  03/06/2017  . IR US GUIDE VASC ACCESS RIGHT  03/25/2017  . LEFT HEART CATH AND CORONARY ANGIOGRAPHY N/A 01/07/2018   Procedure: LEFT HEART CATH AND CORONARY ANGIOGRAPHY;  Surgeon: Troy Sine, MD;  Location: [facility];  Service: Cardiovascular;  Laterality: N/A;  . POSTERIOR VITRECTOMY AND MEMBRANE PEEL-LEFT EYE  09/28/2002  . POSTERIOR VITRECTOMY AND MEMBRANE PEEL-RIGHT EYE  03/16/2002  . RETINAL DETACHMENT SURGERY      . TEE WITHOUT CARDIOVERSION N/A 01/14/2018   Procedure: TRANSESOPHAGEAL ECHOCARDIOGRAM (TEE);  Surgeon: Grace Isaac, MD;  Location: [facility];  Service: Open Heart Surgery;  Laterality: N/A;     OB History   None      Home Medications    Prior to Admission medications   Medication Sig Start Date End Date Taking? Authorizing Provider  acetaminophen (TYLENOL) 325 MG tablet Take 2 tablets (650 mg total) by mouth every 6 (six) hours as needed for mild pain. 01/25/18   Elgie Collard, PA-C  aspirin EC 325 MG EC tablet Take 1 tablet (325 mg total) by mouth daily. 01/25/18   Elgie Collard, PA-C  atorvastatin (LIPITOR) 80 MG tablet Take 1 tablet (80 mg total) by mouth daily at 6 PM. 01/25/18   Elgie Collard, PA-C  bisacodyl (DULCOLAX) 5 MG EC tablet Take 5 mg by mouth daily as needed for moderate constipation.    [provider]  busPIRone (BUSPAR) 10 MG tablet Take 1 tablet (10 mg total) by mouth 3 (three) times daily. 11/28/17   [name], MD  diphenhydrAMINE (BENADRYL) 25 mg capsule Take 25 mg by mouth every 6 (six) hours as needed for allergies.    [provider]  docusate sodium (COLACE) 100 MG capsule Take 1 capsule (100 mg total) by mouth daily as needed for mild constipation. 02/03/18   Burtis Junes, NP  fluticasone (FLONASE) 50 MCG/ACT nasal spray Place 2 sprays into both nostrils as needed for rhinitis.    [provider]  furosemide (LASIX) 20 MG tablet Take 1 tablet (20 mg total) by mouth daily. 01/25/18   Elgie Collard, PA-C  gabapentin (NEURONTIN) 600 MG tablet Take 600 mg by mouth 2 (two) times daily.    [provider]  guaiFENesin (MUCINEX) 600 MG 12 hr tablet Take 600 mg by mouth 2 (two) times daily.    [provider]  insulin aspart (NOVOLOG) 100 UNIT/ML injection Inject 0-12 Units into the skin 4 (four) times daily -  before meals and at bedtime. Uses a sliding scale: 70-119 = 0 units, 120-150 = 2 units, 151-180 =  4 units,  181-210 = 5 units, 211-240 = 6 units, 241-270 = 8 units, 271-300 = 9 units, 301-330 = 10 units, 331-360 = 11 units, 361-400 = 12 units, greater than 400 notify MD.    [provider]  insulin detemir (LEVEMIR) 100 UNIT/ML injection Inject 0.15 mLs (15 Units total) into the skin every morning. 11/28/17   [name], MD  metoCLOPramide (REGLAN) 10 MG tablet Take 1 tablet (10 mg total) by mouth every 6 (six) hours as needed for nausea (nausea/headache). 01/28/18   Jacqlyn Larsen, PA-C  metoprolol tartrate (LOPRESSOR) 25 MG tablet Take 0.5 tablets (12.5 mg total) by mouth 2 (two) times daily. 01/25/18   Elgie Collard, PA-C  ondansetron [facility]  ODT) 4 MG disintegrating tablet Take 1 tablet (4 mg total) by mouth every 8 (eight) hours as needed for nausea or vomiting. 12/20/17   Varney Biles, MD  pantoprazole (PROTONIX) 40 MG tablet Take 1 tablet (40 mg total) by mouth daily. 11/28/17   [name], MD  polyethylene glycol (MIRALAX / GLYCOLAX) packet Take 17 g by mouth daily as needed for mild constipation.  04/11/17   [provider]  potassium chloride (K-DUR,KLOR-CON) 10 MEQ tablet Take 1 tablet (10 mEq total) by mouth daily. 01/25/18   Elgie Collard, PA-C  promethazine (PHENERGAN) 12.5 MG tablet Take 1 tablet (12.5 mg total) by mouth every 6 (six) hours as needed for nausea or vomiting. 01/25/18   Elgie Collard, PA-C  pyridostigmine (MESTINON) 60 MG tablet Take 0.5 tablets (30 mg total) by mouth 3 (three) times daily. Take 1/2 with meals 11/11/17   Eugenie Filler, MD    Family History Family History  Problem Relation Age of Onset  . Cystic fibrosis Mother   . Hypertension Father   . Diabetes Brother   . Hypertension Maternal Grandmother     Social History Social History   Tobacco Use  . Smoking status: Never Smoker  . Smokeless tobacco: Never Used  Substance Use Topics  . Alcohol use: No  . Drug use: No     Allergies   Anesthetics, amide; Penicillins;  Buprenorphine hcl; Encainide; and Metoclopramide   Review of Systems Review of Systems  Gastrointestinal: Positive for abdominal pain and vomiting.  All other systems reviewed and are negative.    Physical Exam Updated Vital Signs BP 138/61   Pulse 97   Temp 99.2 F (37.3 C) (Oral)   Resp (!) 24   SpO2 95%   Physical Exam  Constitutional: She is oriented to person, place, and time.  Uncomfortable, chronically ill, dehydrated   HENT:  Head: Normocephalic.  MM dry   Eyes: Pupils are equal, round, and reactive to light. EOM are normal.  Cardiovascular:  Slightly tachycardic   Pulmonary/Chest: Effort normal.  Abdominal: Normal appearance and bowel sounds are normal.  Mild epigastric tenderness, no CVAT   Neurological: She is alert and oriented to person, place, and time.  Skin: Skin is warm. Capillary refill takes less than 2 seconds.  Psychiatric: She has a normal mood and affect.  Nursing note and vitals reviewed.    ED Treatments / Results  Labs (all labs ordered are listed, but only abnormal results are displayed) Labs Reviewed  COMPREHENSIVE METABOLIC PANEL - Abnormal; Notable for the following components:      Result Value   Chloride 96 (*)    Glucose, Bld 474 (*)    Creatinine, Ser 1.48 (*)    Total Protein 9.0 (*)    ALT 47 (*)    Alkaline Phosphatase 264 (*)    Total Bilirubin 1.7 (*)    GFR calc non Af Amer 40 (*)    GFR calc Af Amer 47 (*)    Anion gap 17 (*)    All other components within normal limits  I-STAT CHEM 8, ED - Abnormal; Notable for the following components:   Sodium 133 (*)    Creatinine, Ser 1.20 (*)    Glucose, Bld 486 (*)    Hemoglobin 15.6 (*)    All other components within normal limits  CBG MONITORING, ED - Abnormal; Notable for the following components:   Glucose-Capillary 469 (*)    All other components within normal limits  I-STAT BETA HCG BLOOD, ED ([facility], [facility], [facility] ONLY) - Abnormal; Notable for the following components:    I-stat hCG, quantitative 11.2 (*)    All other components within normal limits  I-STAT VENOUS BLOOD GAS, ED - Abnormal; Notable for the following components:   pCO2, Ven 41.3 (*)    pO2, Ven 47.0 (*)    All other components within normal limits  CBG MONITORING, ED - Abnormal; Notable for the following components:   Glucose-Capillary 451 (*)    All other components within normal limits  CBC WITH DIFFERENTIAL/PLATELET  LIPASE, BLOOD  BLOOD GAS, VENOUS  URINALYSIS, ROUTINE W REFLEX MICROSCOPIC  HCG, QUANTITATIVE, PREGNANCY  I-STAT TROPONIN, ED    EKG EKG Interpretation  Date/Time:  Friday February 14 2018 11:50:32 EDT Ventricular Rate:  94 PR Interval:    QRS Duration: 117 QT Interval:  355 QTC Calculation: 444 R Axis:   80 Text Interpretation:  Sinus rhythm Borderline short PR interval Nonspecific intraventricular conduction delay Probable anteroseptal infarct, old Borderline ST depression, anterolateral leads Baseline wander in lead(s) II No significant change since last tracing Confirmed by Wandra Arthurs ([id number]) on 02/14/2018 11:52:28 AM   Radiology Dg Chest Port 1 View  Result Date: 02/14/2018 CLINICAL DATA:  Chest pain and vomiting. EXAM: PORTABLE CHEST 1 VIEW COMPARISON:  01/28/2018 FINDINGS: There is no focal parenchymal opacity. There is no pleural effusion or pneumothorax. The heart and mediastinal contours are unremarkable. There is evidence of prior CABG. The osseous structures are unremarkable. IMPRESSION: No active disease. Electronically Signed   By: Kathreen Devoid   On: 02/14/2018 12:12    Procedures Procedures (including critical care time)  CRITICAL CARE Performed by: Wandra Arthurs   Total critical care time: 30 minutes  Critical care time was exclusive of separately billable procedures and treating other patients.  Critical care was necessary to treat or prevent imminent or life-threatening deterioration.  Critical care was time spent personally by me on the  following activities: development of treatment plan with patient and/or surrogate as well as nursing, discussions with consultants, evaluation of patient's response to treatment, examination of patient, obtaining history from patient or surrogate, ordering and performing treatments and interventions, ordering and review of laboratory studies, ordering and review of radiographic studies, pulse oximetry and re-evaluation of patient's condition.  Angiocath insertion Performed by: Wandra Arthurs  Consent: Verbal consent obtained. Risks and benefits: risks, benefits and alternatives were discussed Time out: Immediately prior to procedure a "time out" was called to verify the correct patient, procedure, equipment, support staff and site/side marked as required.  Preparation: Patient was prepped and draped in the usual sterile fashion.  Vein Location: R brachial  Ultrasound Guided  Gauge: 20 long   Normal blood return and flush without difficulty Patient tolerance: Patient tolerated the procedure well with no immediate complications.     Medications Ordered in ED Medications  dextrose 5 %-0.45 % sodium chloride infusion ( Intravenous Hold 02/14/18 1334)  insulin regular (NOVOLIN R,HUMULIN R) 100 Units in sodium chloride 0.9 % 100 mL (1 Units/mL) infusion (3.9 Units/hr Intravenous New Bag/Given 02/14/18 1333)  promethazine (PHENERGAN) injection 25 mg (25 mg Intravenous Given 02/14/18 1221)  sodium chloride 0.9 % bolus 1,000 mL (1,000 mLs Intravenous New Bag/Given 02/14/18 1237)  HYDROmorphone (DILAUDID) injection 1 mg (1 mg Intravenous Given 02/14/18 1238)     Initial Impression / Assessment and Plan / ED Course  I have reviewed the triage vital signs and the nursing notes.  Pertinent labs & imaging results that were available during my care of the patient were reviewed by me and considered in my medical decision making (see chart for details).    Lataysha Vohra is a 52 y.o. female here with  vomiting. Has hx of gastroparesis from type 1 diabetes. She has multiple episodes of DKA as well. Also had recent CABG. She is a difficult access so I was able to put [place] IV. Will get labs,  VBG, CXR, trop. Will start glucostabilizer and give antiemetic.   1:59 PM VBG showed pH 7.35. However, her glucose is 470 and AG is 17. I wonder if she has severe dehydration or mild DKA. Given IVF, started on glucostabilizer. Given phenergan and still nauseated. BP down to 130s after vomiting improved. Will admit for dehydration, AKI, early DKA. Trop neg x 1 currently.     Final Clinical Impressions(s) / ED Diagnoses   Final diagnoses:  Type 1 diabetes mellitus with ketoacidosis without coma [facility])    ED Discharge Orders    None       Drenda Freeze, MD 02/14/18 1359

## 2018-02-14 NOTE — ED Triage Notes (Signed)
Pt BIBA for c/o Abd pain n/v that began last night ;pt has a hx of gastroparesis; pt states she recently got home yesterday from having a bypass x 1 month ago ; denies any CP ; pt diaphoretic and vomiting

## 2018-02-14 NOTE — Progress Notes (Signed)
IV TEAM- Discussed with [name] that several [facility] RN's have attempted/assessed for an IV site and midline, unable to find an access. Notes have been placed by the [facility] nurses regarding their attempts/assessments. IR is recommend by past PICC nurses to place this patient's PICC due to scaring. [name] stated she will notify the on-call physician about this information.  [name]

## 2018-02-14 NOTE — H&P (Addendum)
History and Physical    [name] IFO:[id number] DOB: Dec 31, 1965 DOA: 02/14/2018   PCP: [name], MD   Patient coming from:  Home    Chief Complaint: Abdominal pain   HPI: [name] is a 52 y.o. female with medical history significant for diabetes mellitus type 1, medicine noncompliance, depression, and a history of CAD status post CABG in July 2019, spending 2 weeks in a nursing home discharged yesterday to home, Presenting today, with abdominal pain, nausea and vomiting, fatigued.  Patient reports that "the stomach has been acting up "and has not been able to keep any food down.  She denies any fever or chills.  Patient admits not having taking her insulin yesterday.  She has been admitted several times for diabetic ketoacidosis, 5 times in the last 6 months.  She had 2 visits to the emergency department within the last 6 months as well.  She has some issues with IV access, in the past, she had a PICC line placed for comfort.,  The patient denies any chest pain or palpitations, no orthopnea.  She denies any dysuria, frequency hesitancy or incontinence.  She denies any myalgia, joint pain or swelling.  She denies any headaches, focal weakness, numbness or tingling.  No abnormal gait.  No confusion is reported.  She denies any new rashes or lesions.  Her sternotomy site is healing well.  She denies any sick contacts, or recent travel.  ED Course:  BP 138/61   Pulse 97   Temp 99.2 F (37.3 C) (Oral)   Resp (!) 24   SpO2 95%   the ED, she was able to obtain good access, at which time IV fluids and insulin was started as per DKA protocol.  Her pH today is 7.36.  Her PCO2 level is 41.3, bicarbonate 23.7.  Bicarb is 25. Potassium 4.6, sodium 133, glucose on presentation was 474, and now 486. BUN 20, creatinine 1.2, improved from presentation, which time it was 1.48 and of parenthesis. Anion gap is 17. White count is normal at 9, hemoglobin 15.6 in the  setting of dehydration due  to vomiting, and platelets 321. hCG is 11.2, quantitative is pending. She also received Dilaudid and Phenergan for comfort.  She reports feeling better since presentation. Last 2D echo in 01/05/2018 shows EF 55%,, with new apical inferior hypokinesis of the myocardium.   Review of Systems:  As per HPI otherwise all other systems reviewed and are negative  Past Medical History:  Diagnosis Date  . AKI (acute kidney injury) ([place])   . Anemia, iron deficiency   . Anxiety and depression 05/18/2015  . CAD (coronary artery disease), native coronary artery 01/11/2018   Non-STEMI with severe three-vessel disease noted July 2019  . Depression   . Diabetes mellitus without complication ([place])   . Diabetic gastroparesis ([place])   . Diabetic neuropathy, type I diabetes mellitus ([place]) 05/18/2015  . DKA (diabetic ketoacidoses) ([place]) 06/16/2017  . DKA, type 1 ([place]) 03/18/2016  . Essential hypertension   . Gastroparesis   . GERD (gastroesophageal reflux disease)   . HLD (hyperlipidemia)   . MDD (major depressive disorder), single episode, severe , no psychosis ([place])   . Non-ST elevation (NSTEMI) myocardial infarction ([place])   . S/P CABG x 3 01/14/2018  . Tardive dyskinesia   . Vitamin B12 deficiency 08/16/2015    Past Surgical History:  Procedure Laterality Date  . COLONOSCOPY  09/27/2014   at [facility]  . CORONARY ARTERY BYPASS GRAFT N/A 01/14/2018  Procedure: CORONARY ARTERY BYPASS GRAFTING (CABG)X3, RIGHT AND LEFT SAPHENOUS VEIN HARVEST, MAMMARY TAKE DOWN. MAMMARY TO LAD, SVG TO PD, SVG TO DISTAL CIRC.;  Surgeon: Grace Isaac, MD;  Location: [place];  Service: Open Heart Surgery;  Laterality: N/A;  . ESOPHAGOGASTRODUODENOSCOPY  09/27/2014   at [facility], Dr Rolan Lipa. biospy neg for celiac, neg for H pylori.   . EYE SURGERY    . gailstones    . IR FLUORO GUIDE CV LINE RIGHT  02/01/2017  . IR FLUORO GUIDE CV LINE RIGHT  03/06/2017  . IR FLUORO GUIDE CV LINE RIGHT  03/25/2017  . IR GENERIC HISTORICAL   01/24/2016   IR FLUORO GUIDE CV LINE RIGHT 01/24/2016 Darrell K Allred, PA-C [facility]  . IR GENERIC HISTORICAL  01/24/2016   IR US GUIDE VASC ACCESS RIGHT 01/24/2016 Darrell K Allred, PA-C [facility]  . IR US GUIDE VASC ACCESS RIGHT  02/01/2017  . IR US GUIDE VASC ACCESS RIGHT  03/06/2017  . IR US GUIDE VASC ACCESS RIGHT  03/25/2017  . LEFT HEART CATH AND CORONARY ANGIOGRAPHY N/A 01/07/2018   Procedure: LEFT HEART CATH AND CORONARY ANGIOGRAPHY;  Surgeon: Troy Sine, MD;  Location: [facility];  Service: Cardiovascular;  Laterality: N/A;  . POSTERIOR VITRECTOMY AND MEMBRANE PEEL-LEFT EYE  09/28/2002  . POSTERIOR VITRECTOMY AND MEMBRANE PEEL-RIGHT EYE  03/16/2002  . RETINAL DETACHMENT SURGERY    . TEE WITHOUT CARDIOVERSION N/A 01/14/2018   Procedure: TRANSESOPHAGEAL ECHOCARDIOGRAM (TEE);  Surgeon: Grace Isaac, MD;  Location: [facility];  Service: Open Heart Surgery;  Laterality: N/A;    Social History Social History   Socioeconomic History  . Marital status: Single    Spouse name: Not on file  . Number of children: 1  . Years of education: Not on file  . Highest education level: Not on file  Occupational History  . Occupation: Unemployed  Social Needs  . Financial resource strain: Not on file  . Food insecurity:    Worry: Not on file    Inability: Not on file  . Transportation needs:    Medical: Not on file    Non-medical: Not on file  Tobacco Use  . Smoking status: Never Smoker  . Smokeless tobacco: Never Used  Substance and Sexual Activity  . Alcohol use: No  . Drug use: No  . Sexual activity: Not Currently  Lifestyle  . Physical activity:    Days per week: Not on file    Minutes per session: Not on file  . Stress: Not on file  Relationships  . Social connections:    Talks on phone: Not on file    Gets together: Not on file    Attends religious service: Not on file    Active member of club or organization: Not on file    Attends meetings of clubs or  organizations: Not on file    Relationship status: Not on file  . Intimate partner violence:    Fear of current or ex partner: Not on file    Emotionally abused: Not on file    Physically abused: Not on file    Forced sexual activity: Not on file  Other Topics Concern  . Not on file  Social History Narrative  . Not on file     Allergies  Allergen Reactions  . Anesthetics, Amide Nausea And Vomiting  . Penicillins Diarrhea, Nausea And Vomiting and Other (See Comments)    Has patient had a PCN  reaction causing immediate rash, facial/tongue/throat swelling, SOB or lightheadedness with hypotension: Yes Has patient had a PCN reaction causing severe rash involving mucus membranes or skin necrosis: No Has patient had a PCN reaction that required hospitalization No Has patient had a PCN reaction occurring within the last 10 years: Yes  If all of the above answers are "NO", then may proceed with Cephalosporin use.   . Buprenorphine Hcl Rash  . Encainide Nausea And Vomiting  . Metoclopramide Other (See Comments)    Dystonia, muscle rigidity.     Family History  Problem Relation Age of Onset  . Cystic fibrosis Mother   . Hypertension Father   . Diabetes Brother   . Hypertension Maternal Grandmother        Prior to Admission medications   Medication Sig Start Date End Date Taking? Authorizing Provider  acetaminophen (TYLENOL) 325 MG tablet Take 2 tablets (650 mg total) by mouth every 6 (six) hours as needed for mild pain. 01/25/18   Elgie Collard, PA-C  aspirin EC 325 MG EC tablet Take 1 tablet (325 mg total) by mouth daily. 01/25/18   Elgie Collard, PA-C  atorvastatin (LIPITOR) 80 MG tablet Take 1 tablet (80 mg total) by mouth daily at 6 PM. 01/25/18   Elgie Collard, PA-C  bisacodyl (DULCOLAX) 5 MG EC tablet Take 5 mg by mouth daily as needed for moderate constipation.    [provider]  busPIRone (BUSPAR) 10 MG tablet Take 1 tablet (10 mg total) by mouth 3 (three) times  daily. 11/28/17   [name], MD  diphenhydrAMINE (BENADRYL) 25 mg capsule Take 25 mg by mouth every 6 (six) hours as needed for allergies.    [provider]  docusate sodium (COLACE) 100 MG capsule Take 1 capsule (100 mg total) by mouth daily as needed for mild constipation. 02/03/18   Burtis Junes, NP  fluticasone (FLONASE) 50 MCG/ACT nasal spray Place 2 sprays into both nostrils as needed for rhinitis.    [provider]  furosemide (LASIX) 20 MG tablet Take 1 tablet (20 mg total) by mouth daily. 01/25/18   Elgie Collard, PA-C  gabapentin (NEURONTIN) 600 MG tablet Take 600 mg by mouth 2 (two) times daily.    [provider]  guaiFENesin (MUCINEX) 600 MG 12 hr tablet Take 600 mg by mouth 2 (two) times daily.    [provider]  insulin aspart (NOVOLOG) 100 UNIT/ML injection Inject 0-12 Units into the skin 4 (four) times daily -  before meals and at bedtime. Uses a sliding scale: 70-119 = 0 units, 120-150 = 2 units, 151-180 =  4 units, 181-210 = 5 units, 211-240 = 6 units, 241-270 = 8 units, 271-300 = 9 units, 301-330 = 10 units, 331-360 = 11 units, 361-400 = 12 units, greater than 400 notify MD.    [provider]  insulin detemir (LEVEMIR) 100 UNIT/ML injection Inject 0.15 mLs (15 Units total) into the skin every morning. 11/28/17   [name], MD  metoCLOPramide (REGLAN) 10 MG tablet Take 1 tablet (10 mg total) by mouth every 6 (six) hours as needed for nausea (nausea/headache). 01/28/18   Jacqlyn Larsen, PA-C  metoprolol tartrate (LOPRESSOR) 25 MG tablet Take 0.5 tablets (12.5 mg total) by mouth 2 (two) times daily. 01/25/18   Elgie Collard, PA-C  ondansetron (ZOFRAN ODT) 4 MG disintegrating tablet Take 1 tablet (4 mg total) by mouth every 8 (eight) hours as needed for nausea or vomiting.  12/20/17   Varney Biles, MD  pantoprazole (PROTONIX) 40 MG tablet Take 1 tablet (40 mg total) by mouth daily. 11/28/17   [name], MD  polyethylene glycol  (MIRALAX / GLYCOLAX) packet Take 17 g by mouth daily as needed for mild constipation.  04/11/17   [provider]  potassium chloride (K-DUR,KLOR-CON) 10 MEQ tablet Take 1 tablet (10 mEq total) by mouth daily. 01/25/18   Elgie Collard, PA-C  promethazine (PHENERGAN) 12.5 MG tablet Take 1 tablet (12.5 mg total) by mouth every 6 (six) hours as needed for nausea or vomiting. 01/25/18   Elgie Collard, PA-C  pyridostigmine (MESTINON) 60 MG tablet Take 0.5 tablets (30 mg total) by mouth 3 (three) times daily. Take 1/2 with meals 11/11/17   Eugenie Filler, MD     Physical Exam:  Vitals:   02/14/18 1230 02/14/18 1245 02/14/18 1300 02/14/18 1315  BP: (!) 167/95 (!) 137/56 (!) 134/56 138/61  Pulse: (!) 112 97 97 97  Resp: (!) 24   (!) 24  Temp:      TempSrc:      SpO2: 98% 90% 96% 95%   Constitutional: NAD, appears very lethargic, and ill appearing.   Eyes: PERRL, lids and conjunctivae normal ENMT: Mucous membranes are moist, without exudate or lesions  Neck: normal, supple, no masses, no thyromegaly Respiratory: clear to auscultation bilaterally, no wheezing, no crackles. Normal respiratory effort  Cardiovascular: Regular rate and rhythm, 1 out of 6 murmur, rubs or gallops.  Well-healed sternotomy scar.  No extremity edema. 2+ pedal pulses. No carotid bruits.  Abdomen: Soft, non tender, No hepatosplenomegaly. Bowel sounds positive.  Musculoskeletal: no clubbing / cyanosis. Moves all extremities Skin: no jaundice, No lesions.  Neurologic: Sensation intact  Strength equal in all extremities Psychiatric:   Alert and oriented x 3.  Lethargic mood.  Somewhat somnolent.    Labs on Admission: I have personally reviewed following labs and imaging studies  CBC: Recent Labs  Lab 02/14/18 1217 02/14/18 1232  WBC 9.0  --   NEUTROABS 7.2  --   HGB 13.6 15.6*  HCT 42.9 46.0  MCV 88.6  --   PLT 321  --     Basic Metabolic Panel: Recent Labs  Lab 02/14/18 1217 02/14/18 1232  NA  135 133*  K 4.5 4.6  CL 96* 98  CO2 22  --   GLUCOSE 474* 486*  BUN 17 20  CREATININE 1.48* 1.20*  CALCIUM 10.3  --     GFR: Estimated Creatinine Clearance: 47.5 mL/min (A) (by C-G formula based on SCr of 1.2 mg/dL (H)).  Liver Function Tests: Recent Labs  Lab 02/14/18 1217  AST 38  ALT 47*  ALKPHOS 264*  BILITOT 1.7*  PROT 9.0*  ALBUMIN 3.9   Recent Labs  Lab 02/14/18 1217  LIPASE 44   No results for input(s): AMMONIA in the last 168 hours.  Coagulation Profile: No results for input(s): INR, PROTIME in the last 168 hours.  Cardiac Enzymes: No results for input(s): CKTOTAL, CKMB, CKMBINDEX, TROPONINI in the last 168 hours.  BNP (last 3 results) No results for input(s): PROBNP in the last 8760 hours.  HbA1C: No results for input(s): HGBA1C in the last 72 hours.  CBG: Recent Labs  Lab 02/14/18 1212 02/14/18 1327  GLUCAP 469* 451*    Lipid Profile: No results for input(s): CHOL, HDL, LDLCALC, TRIG, CHOLHDL, LDLDIRECT in the last 72 hours.  Thyroid Function Tests: No results for input(s): TSH, T4TOTAL, FREET4,  T3FREE, THYROIDAB in the last 72 hours.  Anemia Panel: No results for input(s): VITAMINB12, FOLATE, FERRITIN, TIBC, IRON, RETICCTPCT in the last 72 hours.  Urine analysis:    Component Value Date/Time   COLORURINE YELLOW 01/28/2018 1628   APPEARANCEUR CLEAR 01/28/2018 1628   LABSPEC 1.006 01/28/2018 1628   PHURINE 6.0 01/28/2018 1628   GLUCOSEU NEGATIVE 01/28/2018 1628   HGBUR NEGATIVE 01/28/2018 1628   BILIRUBINUR NEGATIVE 01/28/2018 1628   BILIRUBINUR neg 01/17/2017 1533   KETONESUR NEGATIVE 01/28/2018 1628   PROTEINUR NEGATIVE 01/28/2018 1628   UROBILINOGEN 0.2 01/17/2017 1533   UROBILINOGEN 0.2 04/27/2015 1608   NITRITE NEGATIVE 01/28/2018 1628   LEUKOCYTESUR NEGATIVE 01/28/2018 1628    Sepsis Labs: @LABRCNTIP (procalcitonin:4,lacticidven:4) )No results found for this or any previous visit (from the past 240 hour(s)).    Radiological Exams on Admission: Dg Chest Port 1 View  Result Date: 02/14/2018 CLINICAL DATA:  Chest pain and vomiting. EXAM: PORTABLE CHEST 1 VIEW COMPARISON:  01/28/2018 FINDINGS: There is no focal parenchymal opacity. There is no pleural effusion or pneumothorax. The heart and mediastinal contours are unremarkable. There is evidence of prior CABG. The osseous structures are unremarkable. IMPRESSION: No active disease. Electronically Signed   By: Kathreen Devoid   On: 02/14/2018 12:12    EKG: Independently reviewed.  Assessment/Plan Principal Problem:   DKA, type 1 (HCC) Active Problems:   Diabetic neuropathy, type I diabetes mellitus (HCC)   Anxiety and depression   Diabetic gastroparesis (HCC)   Vitamin B12 deficiency   AKI (acute kidney injury) (HCC)   Anemia, iron deficiency   HLD (hyperlipidemia)   GERD (gastroesophageal reflux disease)   Essential hypertension   Noncompliance with treatment plan   Non-ST elevation (NSTEMI) myocardial infarction ([place])   CAD (coronary artery disease), native coronary artery   S/P CABG x 3   the ED, she was able to obtain good access, at which time IV fluids andBUN 20, creatinine 1.2, improved from presentation, which time it was 1.48 and of parenthesis. Anion gap is 17. White count is normal at 9, hemoglobin 15.6 in the  setting of dehydration due to vomiting, and platelets 321. hCG is 11.2, quantitative is pending.   Diabetic Ketoacidosis likely precipitated by medicine noncompliance.  She has not been taking her medication since discharge from the skilled nursing facility.  The patient has similar admissions for same, last on 01/04/2018.   insulin was started as per DKA protocol.  Her pH today is 7.36.  Her PCO2 level is 41.3, bicarbonate 23.7.  Bicarb is 25. Potassium 4.6, sodium 133, glucose on presentation was 474, and now 486. BILI 1.7 likely due to dehydration  Admit for SDU DKA order set  Urinalysis Serial lactic acid  NPO for  now Education for diabetes, will request diabetic coordinator.  Patient will need to work outpatient closely with her PCP and or endocrinology to aggressively control her diabetes. Will hold her Levemir and home insulin for now, resume when off of DKA  Continue antiemetics as needed, resume Lyrica for peripheral neuropathy when appropriate. Patient may need home health nursing to help with her medications, the patient may need social work consultation regarding this issue, to avoid further hospitalizations.  Diabetic gastroparesis . Last BM yesterday . Abd pain better controlled Continue Reglan and Mestinon  when able to take po Continue antiemetics and laxatives  prn  Pain meds prn   Acute Kidney Injury likely due to dehydration current creatinine is 1.2. Lab Results  Component Value  Date   CREATININE 1.20 (H) 02/14/2018   CREATININE 1.48 (H) 02/14/2018   CREATININE 1.00 01/28/2018  IVF CMET in am Urine culture  Depression and anxiety We will resume her home medications when she is off of n.p.o.   CAD status post non-STEMI, status post CABG, troponin negative. This can be followed as an outpatient by the CTS and cardiology when discharged    Hypertension BP 129/64   Pulse 97    Continue home anti-hypertensive medications when able to tolerate  Hydralazine IV prn for BP >180   Hyperlipidemia Continue home statins when able to tolerate po   BHG elevation, unknown etiology, at 11.2, will order quant HCG.   GERD, no acute symptoms Continue PPI   DVT prophylaxis:  Lovenox Code Status:    Full  Family Communication:  Discussed with patient Disposition Plan: Expect patient to be discharged to home after condition improves Consults called:    None  Admission status: IP SDU    Sharene Butters, PA-C [facility]   Amion text  [phone]   02/14/2018, 1:46 PM

## 2018-02-14 NOTE — ED Notes (Signed)
Dr. Darl Householder aware of HCG quantl result;  Pt states she hasn't been sexually active x 1 year ; MD states " its probably a false positive "

## 2018-02-14 NOTE — Progress Notes (Signed)
Phlebotomy and IV unsuccessfully attempted to obtain blood draw and start second IV for K+. Notified physician on call.

## 2018-02-14 NOTE — Progress Notes (Signed)
This RN with a fellow RN went to assessed patient's vasculature for PIV placement. Assessed with and without ultrasound, unable to locate good veins for PIV access, noted for poor vasculature. Primary RN made aware.

## 2018-02-15 DIAGNOSIS — E1010 Type 1 diabetes mellitus with ketoacidosis without coma: Principal | ICD-10-CM

## 2018-02-15 LAB — BASIC METABOLIC PANEL
Anion gap: 13 (ref 5–15)
BUN: 13 mg/dL (ref 6–20)
CHLORIDE: 103 mmol/L (ref 98–111)
CO2: 22 mmol/L (ref 22–32)
CREATININE: 1.18 mg/dL — AB (ref 0.44–1.00)
Calcium: 9.4 mg/dL (ref 8.9–10.3)
GFR, EST NON AFRICAN AMERICAN: 53 mL/min — AB (ref >60)
Glucose, Bld: 181 mg/dL — ABNORMAL HIGH (ref 70–99)
Potassium: 3.5 mmol/L (ref 3.5–5.1)
SODIUM: 138 mmol/L (ref 135–145)

## 2018-02-15 LAB — CBC
HCT: 37.1 % (ref 36.0–46.0)
HEMOGLOBIN: 12 g/dL (ref 12.0–15.0)
MCH: 28.6 pg (ref 26.0–34.0)
MCHC: 32.3 g/dL (ref 30.0–36.0)
MCV: 88.3 fL (ref 78.0–100.0)
Platelets: 291 10*3/uL (ref 150–400)
RBC: 4.2 MIL/uL (ref 3.87–5.11)
RDW: 14.3 % (ref 11.5–15.5)
WBC: 6.4 10*3/uL (ref 4.0–10.5)

## 2018-02-15 LAB — GLUCOSE, CAPILLARY
GLUCOSE-CAPILLARY: 237 mg/dL — AB (ref 70–99)
GLUCOSE-CAPILLARY: 129 mg/dL — AB (ref 70–99)
Glucose-Capillary: 151 mg/dL — ABNORMAL HIGH (ref 70–99)
Glucose-Capillary: 206 mg/dL — ABNORMAL HIGH (ref 70–99)
Glucose-Capillary: 128 mg/dL — ABNORMAL HIGH (ref 70–99)
Glucose-Capillary: 135 mg/dL — ABNORMAL HIGH (ref 70–99)
Glucose-Capillary: 147 mg/dL — ABNORMAL HIGH (ref 70–99)
Glucose-Capillary: 191 mg/dL — ABNORMAL HIGH (ref 70–99)
Glucose-Capillary: 199 mg/dL — ABNORMAL HIGH (ref 70–99)
Glucose-Capillary: 147 mg/dL — ABNORMAL HIGH (ref 70–99)
Glucose-Capillary: 347 mg/dL — ABNORMAL HIGH (ref 70–99)
Glucose-Capillary: 281 mg/dL — ABNORMAL HIGH (ref 70–99)

## 2018-02-15 MED ORDER — PYRIDOSTIGMINE BROMIDE 60 MG PO TABS
30.0000 mg | ORAL_TABLET | Freq: Three times a day (TID) | ORAL | Status: DC
  Administered 2018-02-15 – 2018-02-16 (×4): 30 mg via ORAL
  Filled 2018-02-15 (×5): qty 0.5

## 2018-02-15 MED ORDER — GABAPENTIN 600 MG PO TABS
600.0000 mg | ORAL_TABLET | Freq: Two times a day (BID) | ORAL | Status: DC
  Administered 2018-02-15 – 2018-02-16 (×3): 600 mg via ORAL
  Filled 2018-02-15 (×3): qty 1

## 2018-02-15 MED ORDER — FLUTICASONE PROPIONATE 50 MCG/ACT NA SUSP: 2.0000 | NASAL | Status: DC | PRN

## 2018-02-15 MED ORDER — BUSPIRONE HCL 10 MG PO TABS
10.0000 mg | ORAL_TABLET | Freq: Three times a day (TID) | ORAL | Status: DC
  Administered 2018-02-15 – 2018-02-16 (×4): 10 mg via ORAL
  Filled 2018-02-15 (×5): qty 1

## 2018-02-15 MED ORDER — ASPIRIN EC 325 MG PO TBEC
325.0000 mg | DELAYED_RELEASE_TABLET | Freq: Every day | ORAL | Status: DC
  Administered 2018-02-15 – 2018-02-16 (×2): 325 mg via ORAL
  Filled 2018-02-15 (×2): qty 1

## 2018-02-15 MED ORDER — INSULIN ASPART 100 UNIT/ML ~~LOC~~ SOLN
0.0000 [IU] | Freq: Three times a day (TID) | SUBCUTANEOUS | Status: DC
  Administered 2018-02-15: 1 [IU] via SUBCUTANEOUS
  Administered 2018-02-15: 7 [IU] via SUBCUTANEOUS
  Administered 2018-02-16: 9 [IU] via SUBCUTANEOUS
  Administered 2018-02-16: 3 [IU] via SUBCUTANEOUS

## 2018-02-15 MED ORDER — INSULIN DETEMIR 100 UNIT/ML ~~LOC~~ SOLN
15.0000 [IU] | SUBCUTANEOUS | Status: DC
  Administered 2018-02-15 – 2018-02-16 (×2): 15 [IU] via SUBCUTANEOUS
  Filled 2018-02-15 (×2): qty 0.15

## 2018-02-15 MED ORDER — PANTOPRAZOLE SODIUM 40 MG PO TBEC
40.0000 mg | DELAYED_RELEASE_TABLET | Freq: Every day | ORAL | Status: DC
  Administered 2018-02-15 – 2018-02-16 (×2): 40 mg via ORAL
  Filled 2018-02-15 (×2): qty 1

## 2018-02-15 MED ORDER — METOPROLOL TARTRATE 12.5 MG HALF TABLET
12.5000 mg | ORAL_TABLET | Freq: Two times a day (BID) | ORAL | Status: DC
  Administered 2018-02-15 – 2018-02-16 (×3): 12.5 mg via ORAL
  Filled 2018-02-15 (×3): qty 1

## 2018-02-15 MED ORDER — PROMETHAZINE HCL 25 MG PO TABS: 12.5000 mg | ORAL_TABLET | Freq: Four times a day (QID) | ORAL | Status: DC | PRN

## 2018-02-15 MED ORDER — ATORVASTATIN CALCIUM 80 MG PO TABS
80.0000 mg | ORAL_TABLET | Freq: Every day | ORAL | Status: DC
  Administered 2018-02-15: 80 mg via ORAL
  Filled 2018-02-15: qty 1

## 2018-02-15 MED ORDER — DOCUSATE SODIUM 100 MG PO CAPS: 100.0000 mg | ORAL_CAPSULE | Freq: Every day | ORAL | Status: DC | PRN

## 2018-02-15 MED ORDER — DIPHENHYDRAMINE HCL 25 MG PO CAPS: 25.0000 mg | ORAL_CAPSULE | Freq: Four times a day (QID) | ORAL | Status: DC | PRN

## 2018-02-15 MED ORDER — INSULIN ASPART 100 UNIT/ML ~~LOC~~ SOLN
0.0000 [IU] | Freq: Every day | SUBCUTANEOUS | Status: DC
  Administered 2018-02-15: 3 [IU] via SUBCUTANEOUS

## 2018-02-15 NOTE — Progress Notes (Signed)
Notified physician on call that pt has had four CBG"s within target range. Instructed to leave pt on glucose stabilizer until the results of BMP is in.

## 2018-02-15 NOTE — Progress Notes (Signed)
PROGRESS NOTE    [name]  ZOX:[id number] DOB: 1966-03-12 DOA: 02/14/2018 PCP: [name], MD    Brief Narrative:  52 year old with past medical history relevant for coronary artery disease status post three-vessel CABG on 01/14/2018, hypertension, hyperlipidemia, type 1 diabetes with poor compliance, gated by diabetic neuropathy, diabetic gastroparesis, stage II CKD who presents with flare of diabetic gastroparesis and DKA.   Assessment & Plan:   Principal Problem:   DKA, type 1 ([place]) Active Problems:   Diabetic neuropathy, type I diabetes mellitus ([place])   Anxiety and depression   Diabetic gastroparesis (HCC)   Vitamin B12 deficiency   AKI (acute kidney injury) (HCC)   Anemia, iron deficiency   HLD (hyperlipidemia)   GERD (gastroesophageal reflux disease)   Essential hypertension   Noncompliance with treatment plan   Non-ST elevation (NSTEMI) myocardial infarction ([place])   CAD (coronary artery disease), native coronary artery   S/P CABG x 3   #) Type 1 diabetes, gated by DKA: Most likely secondary to flare of gastroparesis versus hyperglycemia causing flareup of gastroparesis.  Her gap is closed on IV insulin.  She is improving. -Restart home Levemir 15 units every morning -Sliding scale insulin before meals at bedtime -Discontinue drip with overlap - Carb restricted diet  #) Diabetic gastroparesis: Patient reports flare of her diabetic gastroparesis.  Unfortunately she has complications with metoclopramide as she does have lipsmacking behavior. -Gastroparesis education provided - Continue home pyridostigmine 30 mg with meals -Continue PRN promethazine -We will consider erythromycin if patient continues to have symptoms  #) Diabetic neuropathy: -Continue gabapentin 600 mg twice daily  #) Acute on chronic stage II kidney disease: AK has resolved with IV fluids - Avoid nephrotoxins  #) Coronary artery disease status post  CABG/hypertension/hyperlipidemia: -Hold home furosemide 20 mg daily -Continue aspirin 325 daily -Continue atorvastatin 80 mg daily -Continue metoprolol tartrate 12.5 mg twice daily  #) Psych/pain: -Continue buspirone 10 mg 3 times daily -Gabapentin per above  Fluids: Gentle IV fluids Electrolytes: Monitor and supplement Nutrition: Carb/heart healthy diet restricted diet  Prophylaxis: Enoxaparin  Disposition: Pending able to tolerate p.o. and resolution of hypoglycemia  Full code   Consultants:   None  Procedures:   None  Antimicrobials:   None   Subjective: Patient reports she is feeling somewhat better.  She denies any current nausea, abdominal pain, vomiting, chest pain, cough, congestion, rhinorrhea.  She is eager to try meal.  Objective: Vitals:   02/14/18 1817 02/14/18 2130 02/15/18 0340 02/15/18 0729  BP:  (!) 131/51  139/63  Pulse:    88  Resp:  10  17  Temp:  98.8 F (37.1 C)  98.3 F (36.8 C)  TempSrc:  Oral  Oral  SpO2:  98%  (!) 89%  Weight: 62.9 kg  64.3 kg   Height: 5' 0.98" (1.549 m)       Intake/Output Summary (Last 24 hours) at 02/15/2018 0941 Last data filed at 02/14/2018 1435 Gross per 24 hour  Intake 1003.99 ml  Output -  Net 1003.99 ml   Filed Weights   02/14/18 1817 02/15/18 0340  Weight: 62.9 kg 64.3 kg    Examination:  General exam: Appears calm and comfortable  Respiratory system: Clear to auscultation. Respiratory effort normal. Cardiovascular system: Regular rate and rhythm, no murmurs Gastrointestinal system: Soft, nontender, no rebound or guarding, plus bowel sounds. Central nervous system: Alert and oriented. No focal neurological deficits.  Lipsmacking behavior noted Extremities: No lower extremity edema. Skin: Well-healed sternotomy incision  Psychiatry: Judgement and insight appear normal. Mood & affect appropriate.     Data Reviewed: I have personally reviewed following labs and imaging studies  CBC: Recent  Labs  Lab 02/14/18 1217 02/14/18 1232 02/15/18 0542  WBC 9.0  --  6.4  NEUTROABS 7.2  --   --   HGB 13.6 15.6* 12.0  HCT 42.9 46.0 37.1  MCV 88.6  --  88.3  PLT 321  --  458   Basic Metabolic Panel: Recent Labs  Lab 02/14/18 1217 02/14/18 1232 02/15/18 0542  NA 135 133* 138  K 4.5 4.6 3.5  CL 96* 98 103  CO2 22  --  22  GLUCOSE 474* 486* 181*  BUN 17 20 13   CREATININE 1.48* 1.20* 1.18*  CALCIUM 10.3  --  9.4   GFR: Estimated Creatinine Clearance: 49 mL/min (A) (by C-G formula based on SCr of 1.18 mg/dL (H)). Liver Function Tests: Recent Labs  Lab 02/14/18 1217  AST 38  ALT 47*  ALKPHOS 264*  BILITOT 1.7*  PROT 9.0*  ALBUMIN 3.9   Recent Labs  Lab 02/14/18 1217  LIPASE 44   No results for input(s): AMMONIA in the last 168 hours. Coagulation Profile: No results for input(s): INR, PROTIME in the last 168 hours. Cardiac Enzymes: No results for input(s): CKTOTAL, CKMB, CKMBINDEX, TROPONINI in the last 168 hours. BNP (last 3 results) No results for input(s): PROBNP in the last 8760 hours. HbA1C: No results for input(s): HGBA1C in the last 72 hours. CBG: Recent Labs  Lab 02/15/18 0325 02/15/18 0458 02/15/18 0613 02/15/18 0731 02/15/18 0850  GLUCAP 128* 147* 191* 237* 199*   Lipid Profile: No results for input(s): CHOL, HDL, LDLCALC, TRIG, CHOLHDL, LDLDIRECT in the last 72 hours. Thyroid Function Tests: No results for input(s): TSH, T4TOTAL, FREET4, T3FREE, THYROIDAB in the last 72 hours. Anemia Panel: No results for input(s): VITAMINB12, FOLATE, FERRITIN, TIBC, IRON, RETICCTPCT in the last 72 hours. Sepsis Labs: No results for input(s): PROCALCITON, LATICACIDVEN in the last 168 hours.  No results found for this or any previous visit (from the past 240 hour(s)).       Radiology Studies: Dg Chest Port 1 View  Result Date: 02/14/2018 CLINICAL DATA:  Chest pain and vomiting. EXAM: PORTABLE CHEST 1 VIEW COMPARISON:  01/28/2018 FINDINGS: There is  no focal parenchymal opacity. There is no pleural effusion or pneumothorax. The heart and mediastinal contours are unremarkable. There is evidence of prior CABG. The osseous structures are unremarkable. IMPRESSION: No active disease. Electronically Signed   By: Kathreen Devoid   On: 02/14/2018 12:12        Scheduled Meds: . aspirin  325 mg Oral Daily  . atorvastatin  80 mg Oral q1800  . busPIRone  10 mg Oral TID  . enoxaparin (LOVENOX) injection  40 mg Subcutaneous Q24H  . gabapentin  600 mg Oral BID  . insulin aspart  0-5 Units Subcutaneous QHS  . insulin aspart  0-9 Units Subcutaneous TID WC  . insulin detemir  15 Units Subcutaneous BH-[id number]  . metoprolol tartrate  12.5 mg Oral BID  . pantoprazole  40 mg Oral Daily  . pyridostigmine  30 mg Oral TID   Continuous Infusions: . dextrose 5 % and 0.45% NaCl 75 mL/hr at 02/14/18 1814  . insulin (NOVOLIN-R) infusion 1.3 Units/hr (02/15/18 0616)     LOS: 1 day    Time spent: [place], MD [facility]  If 7PM-7AM, please contact night-coverage  [email or web address] Password [facility] 02/15/2018, 9:41 AM

## 2018-02-16 LAB — CBC
HCT: 36.7 % (ref 36.0–46.0)
Hemoglobin: 11.6 g/dL — ABNORMAL LOW (ref 12.0–15.0)
MCH: 28.5 pg (ref 26.0–34.0)
MCHC: 31.6 g/dL (ref 30.0–36.0)
MCV: 90.2 fL (ref 78.0–100.0)
Platelets: 265 K/uL (ref 150–400)
RBC: 4.07 MIL/uL (ref 3.87–5.11)
RDW: 14.5 % (ref 11.5–15.5)
WBC: 6.8 10*3/uL (ref 4.0–10.5)

## 2018-02-16 LAB — BASIC METABOLIC PANEL
Anion gap: 8 (ref 5–15)
CO2: 23 mmol/L (ref 22–32)
Calcium: 8.9 mg/dL (ref 8.9–10.3)
Chloride: 103 mmol/L (ref 98–111)
GFR calc non Af Amer: 52 mL/min — ABNORMAL LOW (ref >60)
Glucose, Bld: 404 mg/dL — ABNORMAL HIGH (ref 70–99)
Sodium: 134 mmol/L — ABNORMAL LOW (ref 135–145)

## 2018-02-16 LAB — BASIC METABOLIC PANEL WITH GFR
BUN: 18 mg/dL (ref 6–20)
Creatinine, Ser: 1.2 mg/dL — ABNORMAL HIGH (ref 0.44–1.00)
GFR calc Af Amer: >60 mL/min (ref >60)
Potassium: 4 mmol/L (ref 3.5–5.1)

## 2018-02-16 LAB — GLUCOSE, CAPILLARY
Glucose-Capillary: 382 mg/dL — ABNORMAL HIGH (ref 70–99)
Glucose-Capillary: 250 mg/dL — ABNORMAL HIGH (ref 70–99)

## 2018-02-16 LAB — MAGNESIUM: Magnesium: 1.9 mg/dL (ref 1.7–2.4)

## 2018-02-16 NOTE — Progress Notes (Signed)
Patient received d/c instructions, and will call sister to pick her up.  She states understanding of medication review and follow up appointments.  She agrees she needs to make her follow up appointments.

## 2018-02-16 NOTE — Discharge Instructions (Signed)

## 2018-02-16 NOTE — Discharge Summary (Signed)
Physician Discharge Summary  [name] SFK:[id number] DOB: 12-21-1965 DOA: 02/14/2018  PCP: [name], MD  Admit date: 02/14/2018 Discharge date: 02/16/2018  Admitted From: Home Disposition: Home  Recommendations for Outpatient Follow-up:  1. Follow up with PCP in 1-2 weeks 2. Please obtain BMP/CBC in one week   Home Health: No Equipment/Devices: No  Discharge Condition: Stable CODE STATUS: Full Diet recommendation: Heart Healthy / Carb Modified  Brief/Interim Summary:  #) Type 1 diabetes complicated by DKA: Patient was admitted with DKA in the setting of a flareup of her gastroparesis.  She was given IV insulin and IV fluids until her gap closed and was then transition to her home insulin dose.  Her blood sugars were well controlled on this dose.  She was given additional teaching.  #) Diabetic gastroparesis: Patient apparently had another flare of her diabetic gastroparesis.  She was not on home oral metoclopramide due to acute dystonic reactions however to tolerate IV medical provide.  She was continued on her home on pyridostigmine and PRN promethazine.  She will have outpatient follow-up at [facility] for her gastroparesis.  Additional education was provided.  #) Diabetic neuropathy: Patient was continued on home gabapentin 6 mg twice daily.  #) Acute on chronic stage II chronic kidney disease: resolved with IV fluids.  #)coronary artery disease status post CABG: She did recently had CABG on 01/11/2018.  She did have some chest wall pain from her incision site.  This was treated conservatively.  #) Hypertension/hyperlipidemia: Patient's home furosemide was held in the setting of AKI and can be restarted on discharge.  She was continued on aspirin 325 and atorvastatin 80 mg.  She was continued on metoprolol tartrate twice a day.  #) Pain/psych: Patient was continued on home buspirone.  Discharge Diagnoses:  Principal Problem:   DKA, type 1 ([place]) Active  Problems:   Diabetic neuropathy, type I diabetes mellitus (HCC)   Anxiety and depression   Diabetic gastroparesis (HCC)   Vitamin B12 deficiency   AKI (acute kidney injury) (HCC)   Anemia, iron deficiency   HLD (hyperlipidemia)   GERD (gastroesophageal reflux disease)   Essential hypertension   Noncompliance with treatment plan   Non-ST elevation (NSTEMI) myocardial infarction ([place])   CAD (coronary artery disease), native coronary artery   S/P CABG x 3    Discharge Instructions  Discharge Instructions    Call MD for:  difficulty breathing, headache or visual disturbances   Complete by:  As directed    Call MD for:  extreme fatigue   Complete by:  As directed    Call MD for:  hives   Complete by:  As directed    Call MD for:  persistant dizziness or light-headedness   Complete by:  As directed    Call MD for:  persistant nausea and vomiting   Complete by:  As directed    Call MD for:  redness, tenderness, or signs of infection (pain, swelling, redness, odor or green/yellow discharge around incision site)   Complete by:  As directed    Call MD for:  severe uncontrolled pain   Complete by:  As directed    Call MD for:  temperature >100.4   Complete by:  As directed    Diet - low sodium heart healthy   Complete by:  As directed    Discharge instructions   Complete by:  As directed    Please follow-up with your primary care doctor in 1 to 2 weeks.  Please check your blood sugars 4 times a day and take your sliding scale insulin as prescribed.   Increase activity slowly   Complete by:  As directed      Allergies as of 02/16/2018      Reactions   Anesthetics, Amide Nausea And Vomiting   Penicillins Diarrhea, Nausea And Vomiting, Other (See Comments)   Has patient had a PCN reaction causing immediate rash, facial/tongue/throat swelling, SOB or lightheadedness with hypotension: Yes Has patient had a PCN reaction causing severe rash involving mucus membranes or skin necrosis:  No Has patient had a PCN reaction that required hospitalization No Has patient had a PCN reaction occurring within the last 10 years: Yes  If all of the above answers are "NO", then may proceed with Cephalosporin use.   Buprenorphine Hcl Rash   Encainide Nausea And Vomiting   Metoclopramide Other (See Comments)   Dystonia, muscle rigidity.  Patient states she is only allergic to the "pill form, not the IV"      Medication List    STOP taking these medications   nitrofurantoin (macrocrystal-monohydrate) 100 MG capsule Commonly known as:  MACROBID     TAKE these medications   acetaminophen 325 MG tablet Commonly known as:  TYLENOL Take 2 tablets (650 mg total) by mouth every 6 (six) hours as needed for mild pain.   aspirin 325 MG EC tablet Take 1 tablet (325 mg total) by mouth daily.   atorvastatin 80 MG tablet Commonly known as:  LIPITOR Take 1 tablet (80 mg total) by mouth daily at 6 PM.   busPIRone 10 MG tablet Commonly known as:  BUSPAR Take 1 tablet (10 mg total) by mouth 3 (three) times daily.   diphenhydrAMINE 25 mg capsule Commonly known as:  BENADRYL Take 25 mg by mouth every 6 (six) hours as needed for allergies.   docusate sodium 100 MG capsule Commonly known as:  COLACE Take 1 capsule (100 mg total) by mouth daily as needed for mild constipation.   fluticasone 50 MCG/ACT nasal spray Commonly known as:  FLONASE Place 2 sprays into both nostrils as needed for rhinitis.   furosemide 20 MG tablet Commonly known as:  LASIX Take 1 tablet (20 mg total) by mouth daily.   gabapentin 600 MG tablet Commonly known as:  NEURONTIN Take 600 mg by mouth 2 (two) times daily.   guaiFENesin 600 MG 12 hr tablet Commonly known as:  MUCINEX Take 600 mg by mouth 2 (two) times daily.   insulin aspart 100 UNIT/ML injection Commonly known as:  novoLOG Inject 0-12 Units into the skin 4 (four) times daily -  before meals and at bedtime. Uses a sliding scale: 70-119 = 0  units, 120-150 = 2 units, 151-180 =  4 units, 181-210 = 5 units, 211-240 = 6 units, 241-270 = 8 units, 271-300 = 9 units, 301-330 = 10 units, 331-360 = 11 units, 361-400 = 12 units, greater than 400 notify MD.   insulin detemir 100 UNIT/ML injection Commonly known as:  LEVEMIR Inject 0.15 mLs (15 Units total) into the skin every morning.   metoCLOPramide 10 MG tablet Commonly known as:  REGLAN Take 1 tablet (10 mg total) by mouth every 6 (six) hours as needed for nausea (nausea/headache).   metoprolol tartrate 25 MG tablet Commonly known as:  LOPRESSOR Take 0.5 tablets (12.5 mg total) by mouth 2 (two) times daily.   pantoprazole 40 MG tablet Commonly known as:  PROTONIX Take 1 tablet (40 mg total)  by mouth daily.   potassium chloride 10 MEQ tablet Commonly known as:  K-DUR,KLOR-CON Take 1 tablet (10 mEq total) by mouth daily.   promethazine 12.5 MG tablet Commonly known as:  PHENERGAN Take 1 tablet (12.5 mg total) by mouth every 6 (six) hours as needed for nausea or vomiting.   pyridostigmine 60 MG tablet Commonly known as:  MESTINON Take 0.5 tablets (30 mg total) by mouth 3 (three) times daily. Take 1/2 with meals       Allergies  Allergen Reactions  . Anesthetics, Amide Nausea And Vomiting  . Penicillins Diarrhea, Nausea And Vomiting and Other (See Comments)    Has patient had a PCN reaction causing immediate rash, facial/tongue/throat swelling, SOB or lightheadedness with hypotension: Yes Has patient had a PCN reaction causing severe rash involving mucus membranes or skin necrosis: No Has patient had a PCN reaction that required hospitalization No Has patient had a PCN reaction occurring within the last 10 years: Yes  If all of the above answers are "NO", then may proceed with Cephalosporin use.   . Buprenorphine Hcl Rash  . Encainide Nausea And Vomiting  . Metoclopramide Other (See Comments)    Dystonia, muscle rigidity.  Patient states she is only allergic to the  "pill form, not the IV"    Consultations:  None   Procedures/Studies: Dg Chest 2 View  Result Date: 01/28/2018 CLINICAL DATA:  Nausea, vomiting and diaphoresis. CABG approximately 2 weeks ago. EXAM: CHEST - 2 VIEW COMPARISON:  01/24/2018 FINDINGS: The patient is status post CABG with mild cardiomegaly, stable in appearance. Hazy opacities are noted at the lung bases left greater than right compatible with atelectasis and/or pneumonia. Posterior small pleural effusions are identified. Improved aeration at the right lung base since prior. No acute osseous abnormality. PICC line has been removed in the interim. IMPRESSION: Persistent left basilar opacity suspicious for pneumonia and/or atelectasis. Posterior small pleural effusions are present as before. Improved aeration of the right lung base since the interim. Electronically Signed   By: Ashley Royalty M.D.   On: 01/28/2018 18:48   Dg Chest 2 View  Result Date: 01/21/2018 CLINICAL DATA:  Follow-up pleural effusion EXAM: CHEST - 2 VIEW COMPARISON:  01/18/2018 FINDINGS: Cardiac shadow is stable. Postsurgical changes are again seen. Left-sided PICC line is noted and stable. Bibasilar atelectatic changes are noted right greater than left with associated small effusions. No new focal infiltrate is seen. No bony abnormality is noted. IMPRESSION: Bibasilar changes as described. Electronically Signed   By: Inez Catalina M.D.   On: 01/21/2018 11:17   Dg Chest Port 1 View  Result Date: 02/14/2018 CLINICAL DATA:  Chest pain and vomiting. EXAM: PORTABLE CHEST 1 VIEW COMPARISON:  01/28/2018 FINDINGS: There is no focal parenchymal opacity. There is no pleural effusion or pneumothorax. The heart and mediastinal contours are unremarkable. There is evidence of prior CABG. The osseous structures are unremarkable. IMPRESSION: No active disease. Electronically Signed   By: Kathreen Devoid   On: 02/14/2018 12:12   Dg Chest Port 1 View  Result Date: 01/24/2018 CLINICAL  DATA:  Shortness of breath. EXAM: PORTABLE CHEST 1 VIEW COMPARISON:  01/21/2018.  01/18/2018. FINDINGS: Left PICC line stable position. Prior CABG. Stable cardiomegaly. Bilateral basilar pulmonary infiltrates/edema and bilateral pleural effusions. No pneumothorax. IMPRESSION: 1.  Left PICC line stable position. 2. Prior CABG. Cardiomegaly with bibasilar infiltrates/edema bilateral pleural effusions. Findings suggest CHF. These findings have progressed from prior exam. Electronically Signed   By: Marcello Moores  Register  On: 01/24/2018 08:30   Dg Chest Port 1 View  Result Date: 01/18/2018 CLINICAL DATA:  Follow-up pleural effusion EXAM: PORTABLE CHEST 1 VIEW COMPARISON:  01/17/2018 FINDINGS: Cardiac shadow is stable. Postoperative changes are again seen. Left-sided PICC line is stable in appearance. Bilateral pleural effusions right greater than left are noted overall stable from the prior exam. No pneumothorax is noted. IMPRESSION: Stable appearance of the chest when compare with the previous day. Electronically Signed   By: Inez Catalina M.D.   On: 01/18/2018 07:46    Subjective:   Discharge Exam: Vitals:   02/16/18 0300 02/16/18 0839  BP: (!) 128/58 (!) 119/51  Pulse: 73 80  Resp: 20 13  Temp: 98.4 F (36.9 C) 98 F (36.7 C)  SpO2: 98% 98%   Vitals:   02/16/18 0041 02/16/18 0300 02/16/18 0459 02/16/18 0839  BP: (!) 99/57 (!) 128/58  (!) 119/51  Pulse: 68 73  80  Resp: 20 20  13   Temp: 97.8 F (36.6 C) 98.4 F (36.9 C)  98 F (36.7 C)  TempSrc: Oral Oral  Oral  SpO2: 98% 98%  98%  Weight:   62.6 kg   Height:         General exam: Appears calm and comfortable  Respiratory system: Clear to auscultation. Respiratory effort normal. Cardiovascular system: Regular rate and rhythm, no murmurs Gastrointestinal system: Soft, nontender, no rebound or guarding, plus bowel sounds. Central nervous system: Alert and oriented. No focal neurological deficits.   Extremities: No lower extremity  edema. Skin: Well-healed sternotomy incision Psychiatry: Judgement and insight appear normal. Mood & affect appropriate.     The results of significant diagnostics from this hospitalization (including imaging, microbiology, ancillary and laboratory) are listed below for reference.     Microbiology: No results found for this or any previous visit (from the past 240 hour(s)).   Labs: BNP (last 3 results) No results for input(s): BNP in the last 8760 hours. Basic Metabolic Panel: Recent Labs  Lab 02/14/18 1217 02/14/18 1232 02/15/18 0542 02/16/18 0716  NA 135 133* 138 134*  K 4.5 4.6 3.5 4.0  CL 96* 98 103 103  CO2 22  --  22 23  GLUCOSE 474* 486* 181* 404*  BUN 17 20 13 18   CREATININE 1.48* 1.20* 1.18* 1.20*  CALCIUM 10.3  --  9.4 8.9  MG  --   --   --  1.9   Liver Function Tests: Recent Labs  Lab 02/14/18 1217  AST 38  ALT 47*  ALKPHOS 264*  BILITOT 1.7*  PROT 9.0*  ALBUMIN 3.9   Recent Labs  Lab 02/14/18 1217  LIPASE 44   No results for input(s): AMMONIA in the last 168 hours. CBC: Recent Labs  Lab 02/14/18 1217 02/14/18 1232 02/15/18 0542 02/16/18 0716  WBC 9.0  --  6.4 6.8  NEUTROABS 7.2  --   --   --   HGB 13.6 15.6* 12.0 11.6*  HCT 42.9 46.0 37.1 36.7  MCV 88.6  --  88.3 90.2  PLT 321  --  291 265   Cardiac Enzymes: No results for input(s): CKTOTAL, CKMB, CKMBINDEX, TROPONINI in the last 168 hours. BNP: Invalid input(s): POCBNP CBG: Recent Labs  Lab 02/15/18 1004 02/15/18 1134 02/15/18 1702 02/15/18 2102 02/16/18 0749  GLUCAP 129* 147* 347* 281* 382*   D-Dimer No results for input(s): DDIMER in the last 72 hours. Hgb A1c No results for input(s): HGBA1C in the last 72 hours. Lipid Profile No results  for input(s): CHOL, HDL, LDLCALC, TRIG, CHOLHDL, LDLDIRECT in the last 72 hours. Thyroid function studies No results for input(s): TSH, T4TOTAL, T3FREE, THYROIDAB in the last 72 hours.  Invalid input(s): FREET3 Anemia work up No  results for input(s): VITAMINB12, FOLATE, FERRITIN, TIBC, IRON, RETICCTPCT in the last 72 hours. Urinalysis    Component Value Date/Time   COLORURINE YELLOW 01/28/2018 1628   APPEARANCEUR CLEAR 01/28/2018 1628   LABSPEC 1.006 01/28/2018 1628   PHURINE 6.0 01/28/2018 1628   GLUCOSEU NEGATIVE 01/28/2018 1628   HGBUR NEGATIVE 01/28/2018 1628   BILIRUBINUR NEGATIVE 01/28/2018 1628   BILIRUBINUR neg 01/17/2017 1533   KETONESUR NEGATIVE 01/28/2018 1628   PROTEINUR NEGATIVE 01/28/2018 1628   UROBILINOGEN 0.2 01/17/2017 1533   UROBILINOGEN 0.2 04/27/2015 1608   NITRITE NEGATIVE 01/28/2018 1628   LEUKOCYTESUR NEGATIVE 01/28/2018 1628   Sepsis Labs Invalid input(s): PROCALCITONIN,  WBC,  LACTICIDVEN Microbiology No results found for this or any previous visit (from the past 240 hour(s)).   Time coordinating discharge: 35  SIGNED:   Cristy Folks, MD  [facility] 02/16/2018, 9:59 AM  If 7PM-7AM, please contact night-coverage [email or web address] Password [email or web address]

## 2018-02-17 ENCOUNTER — Ambulatory Visit
Admission: RE | Admit: 2018-02-17 | Discharge: 2018-02-17 | Disposition: A | Discharge status: Home / Self Care | Payer: Medicare Other | Source: Ambulatory Visit | Attending: Cardiothoracic Surgery | Admitting: Cardiothoracic Surgery

## 2018-02-17 ENCOUNTER — Encounter: Payer: Self-pay | Admitting: Physician Assistant

## 2018-02-17 ENCOUNTER — Ambulatory Visit (INDEPENDENT_AMBULATORY_CARE_PROVIDER_SITE_OTHER): Payer: Self-pay | Admitting: Physician Assistant

## 2018-02-17 VITALS — BP 175/90 | HR 97 | Resp 20 | Ht 61.0 in | Wt 139.0 lb

## 2018-02-17 DIAGNOSIS — Z951 Presence of aortocoronary bypass graft: Secondary | ICD-10-CM

## 2018-02-17 DIAGNOSIS — R0609 Other forms of dyspnea: Secondary | ICD-10-CM | POA: Diagnosis not present

## 2018-02-17 DIAGNOSIS — I2510 Atherosclerotic heart disease of native coronary artery without angina pectoris: Secondary | ICD-10-CM

## 2018-02-17 NOTE — Patient Instructions (Signed)
Keep to sternal precautions for another few weeks.  Cardiac rehab when you are feeling better. Give our office a call.   Make every effort to stay physically active, get some type of exercise on a regular basis, and stick to a "heart healthy diet".  The long term benefits for regular exercise and a healthy diet are critically important to your overall health and wellbeing.  Make every effort to keep your diabetes under very tight control.  Follow up closely with your primary care physician or endocrinologist and strive to keep their hemoglobin A1c levels as low as possible, preferably near or below 6.0.  The long term benefits of strict control of diabetes are far reaching and critically important for your overall health and survival.

## 2018-02-17 NOTE — Progress Notes (Signed)
[name] is a 52 y.o. female patient who underwent coronary bypass grafting x3 with Dr. Servando Snare on 01/14/2018.  Her postoperative course was complicated by severe gastroparesis, nausea, and diarrhea.  She had several previous episodes of DKA and her blood glucose level was difficult to manage.    1. S/P CABG x 3   2. Coronary artery disease involving native heart without angina pectoris, unspecified vessel or lesion type    Past Medical History:  Diagnosis Date  . AKI (acute kidney injury) ([place])   . Anemia, iron deficiency   . Anxiety and depression 05/18/2015  . CAD (coronary artery disease), native coronary artery 01/11/2018   Non-STEMI with severe three-vessel disease noted July 2019  . Depression   . Diabetes mellitus without complication ([place])   . Diabetic gastroparesis ([place])   . Diabetic neuropathy, type I diabetes mellitus ([place]) 05/18/2015  . DKA (diabetic ketoacidoses) ([place]) 06/16/2017  . DKA, type 1 ([place]) 03/18/2016  . Essential hypertension   . Gastroparesis   . GERD (gastroesophageal reflux disease)   . HLD (hyperlipidemia)   . MDD (major depressive disorder), single episode, severe , no psychosis ([place])   . Non-ST elevation (NSTEMI) myocardial infarction ([place])   . S/P CABG x 3 01/14/2018  . Tardive dyskinesia   . Vitamin B12 deficiency 08/16/2015   No past surgical history pertinent negatives on file.   Scheduled Meds: Current Outpatient Medications on File Prior to Visit  Medication Sig Dispense Refill  . acetaminophen (TYLENOL) 325 MG tablet Take 2 tablets (650 mg total) by mouth every 6 (six) hours as needed for mild pain.    Marland Kitchen aspirin EC 325 MG EC tablet Take 1 tablet (325 mg total) by mouth daily. 30 tablet 0  . atorvastatin (LIPITOR) 80 MG tablet Take 1 tablet (80 mg total) by mouth daily at 6 PM. 30 tablet 1  . busPIRone (BUSPAR) 10 MG tablet Take 1 tablet (10 mg total) by mouth 3 (three) times daily. 90 tablet 3  . diphenhydrAMINE (BENADRYL) 25 mg capsule  Take 25 mg by mouth every 6 (six) hours as needed for allergies.    Marland Kitchen docusate sodium (COLACE) 100 MG capsule Take 1 capsule (100 mg total) by mouth daily as needed for mild constipation. 10 capsule 0  . fluticasone (FLONASE) 50 MCG/ACT nasal spray Place 2 sprays into both nostrils as needed for rhinitis.    . furosemide (LASIX) 20 MG tablet Take 1 tablet (20 mg total) by mouth daily. 30 tablet 1  . gabapentin (NEURONTIN) 600 MG tablet Take 600 mg by mouth 2 (two) times daily.    Marland Kitchen guaiFENesin (MUCINEX) 600 MG 12 hr tablet Take 600 mg by mouth 2 (two) times daily.    . insulin aspart (NOVOLOG) 100 UNIT/ML injection Inject 0-12 Units into the skin 4 (four) times daily -  before meals and at bedtime. Uses a sliding scale: 70-119 = 0 units, 120-150 = 2 units, 151-180 =  4 units, 181-210 = 5 units, 211-240 = 6 units, 241-270 = 8 units, 271-300 = 9 units, 301-330 = 10 units, 331-360 = 11 units, 361-400 = 12 units, greater than 400 notify MD.    . insulin detemir (LEVEMIR) 100 UNIT/ML injection Inject 0.15 mLs (15 Units total) into the skin every morning. 10 mL 1  . metoCLOPramide (REGLAN) 10 MG tablet Take 1 tablet (10 mg total) by mouth every 6 (six) hours as needed for nausea (nausea/headache). 10 tablet 0  . metoprolol tartrate (LOPRESSOR) 25  MG tablet Take 0.5 tablets (12.5 mg total) by mouth 2 (two) times daily. 60 tablet 1  . pantoprazole (PROTONIX) 40 MG tablet Take 1 tablet (40 mg total) by mouth daily. 30 tablet 3  . potassium chloride (K-DUR,KLOR-CON) 10 MEQ tablet Take 1 tablet (10 mEq total) by mouth daily. 30 tablet 1  . promethazine (PHENERGAN) 12.5 MG tablet Take 1 tablet (12.5 mg total) by mouth every 6 (six) hours as needed for nausea or vomiting. 30 tablet 0  . pyridostigmine (MESTINON) 60 MG tablet Take 0.5 tablets (30 mg total) by mouth 3 (three) times daily. Take 1/2 with meals 60 tablet 0   No current facility-administered medications on file prior to visit.     Allergies   Allergen Reactions  . Anesthetics, Amide Nausea And Vomiting  . Penicillins Diarrhea, Nausea And Vomiting and Other (See Comments)    Has patient had a PCN reaction causing immediate rash, facial/tongue/throat swelling, SOB or lightheadedness with hypotension: Yes Has patient had a PCN reaction causing severe rash involving mucus membranes or skin necrosis: No Has patient had a PCN reaction that required hospitalization No Has patient had a PCN reaction occurring within the last 10 years: Yes  If all of the above answers are "NO", then may proceed with Cephalosporin use.   . Buprenorphine Hcl Rash  . Encainide Nausea And Vomiting  . Metoclopramide Other (See Comments)    Dystonia, muscle rigidity.  Patient states she is only allergic to the "pill form, not the IV"    Height 5\' 1"  (1.549 m).  Subjective   [name] presents today for her routine 4-week postoperative follow-up visit.  From a surgical standpoint she is doing very well.  She has had ongoing issues with gastroparesis, nausea, and vomiting for the last several years and was recently hospitalized for DKA a few days ago.  She shares that her sugars have been mostly controlled at home and if it is on the high and she will take more insulin to compromise.  She is followed outpatient for her diabetes.  Objective    Cor: RRR, no murmur Pulm: CTA bilaterally and in all fields Abd: + bowel sounds Wound: clean and dry sternal incision and EVH sites on bilateral legs Ext: small amount of bilateral edema  CLINICAL DATA:  Follow-up CABG.  Shortness of breath with exertion.  EXAM: CHEST - 2 VIEW  COMPARISON:  02/14/2018  FINDINGS: Previous median sternotomy and CABG. Heart size is normal. Mediastinal shadows are otherwise normal. The lungs are clear. The vascularity is normal. No pneumothorax or hemothorax. No significant bone finding.  IMPRESSION: Previous CABG.  No active disease.   Electronically Signed   By:  Nelson Chimes M.D.   On: 02/17/2018 14:59   Assessment & Plan  [name] is overall doing well from a surgical standpoint.  She does however have quite a few medical problems that are being sorted out.  She shares that her blood glucose level has been mostly controlled but tends to be a little higher in the morning, but she has been increasing her insulin to compromise.  She did recently have an episode of DKA and was hospitalized overnight.  She still feels quite fatigued from this incident.  She has chronic gastroparesis and did vomit a few times while in the hospital.  Her chest feels a little more sore than usual today.  On exam her incision is healing well and her sternum is stable to palpation.  I reviewed the  chest x-ray with the patient which was stable.  I suggested continuing sternal precautions for another few weeks due to her incisional soreness.  She is currently only taking Tylenol due to intolerance to other pain medicines.  I talked to her about avoiding NSAID medication.  She is not driving due to other reasons.  She wants to participate in cardiac rehab but only when she feels better.  She will call our office and let us know if she needs cardiac rehab referral.  I discussed our [facility] program.  She is encouraged to keep her blood glucose level under tight control to optimize wound healing.  Her diet is still sparse due to her gastroparesis.  She is to follow-up with her primary care doctor and tried to vary her diet as tolerated.  She has not had any recent episodes of vomiting.  She may follow-up with our office on an as-needed basis.  From surgical standpoint she is healing well.  She plans to go back to her GI doctor in [place] to try and troubleshoot her gastroparesis further.  She may call our office with questions if needed.  Elgie Collard 02/17/2018

## 2018-02-18 ENCOUNTER — Encounter (HOSPITAL_COMMUNITY): Payer: Self-pay | Admitting: Emergency Medicine

## 2018-02-18 ENCOUNTER — Emergency Department (HOSPITAL_COMMUNITY): Payer: Medicare Other

## 2018-02-18 ENCOUNTER — Inpatient Hospital Stay (HOSPITAL_COMMUNITY)
Admission: EM | Admit: 2018-02-18 | Discharge: 2018-02-21 | DRG: 638 | Disposition: A | Discharge status: Home / Self Care | Payer: Medicare Other | Attending: Internal Medicine | Admitting: Internal Medicine

## 2018-02-18 DIAGNOSIS — R0789 Other chest pain: Secondary | ICD-10-CM | POA: Diagnosis not present

## 2018-02-18 DIAGNOSIS — R404 Transient alteration of awareness: Secondary | ICD-10-CM | POA: Diagnosis not present

## 2018-02-18 DIAGNOSIS — Z951 Presence of aortocoronary bypass graft: Secondary | ICD-10-CM

## 2018-02-18 DIAGNOSIS — F329 Major depressive disorder, single episode, unspecified: Secondary | ICD-10-CM | POA: Diagnosis present

## 2018-02-18 DIAGNOSIS — I2510 Atherosclerotic heart disease of native coronary artery without angina pectoris: Secondary | ICD-10-CM | POA: Diagnosis not present

## 2018-02-18 DIAGNOSIS — E1043 Type 1 diabetes mellitus with diabetic autonomic (poly)neuropathy: Secondary | ICD-10-CM | POA: Diagnosis present

## 2018-02-18 DIAGNOSIS — D72829 Elevated white blood cell count, unspecified: Secondary | ICD-10-CM | POA: Diagnosis present

## 2018-02-18 DIAGNOSIS — R1110 Vomiting, unspecified: Secondary | ICD-10-CM | POA: Diagnosis not present

## 2018-02-18 DIAGNOSIS — I252 Old myocardial infarction: Secondary | ICD-10-CM

## 2018-02-18 DIAGNOSIS — N179 Acute kidney failure, unspecified: Secondary | ICD-10-CM | POA: Diagnosis present

## 2018-02-18 DIAGNOSIS — Z885 Allergy status to narcotic agent status: Secondary | ICD-10-CM

## 2018-02-18 DIAGNOSIS — F32A Depression, unspecified: Secondary | ICD-10-CM | POA: Diagnosis present

## 2018-02-18 DIAGNOSIS — R000 Tachycardia, unspecified: Secondary | ICD-10-CM | POA: Diagnosis not present

## 2018-02-18 DIAGNOSIS — E1010 Type 1 diabetes mellitus with ketoacidosis without coma: Secondary | ICD-10-CM | POA: Diagnosis not present

## 2018-02-18 DIAGNOSIS — K219 Gastro-esophageal reflux disease without esophagitis: Secondary | ICD-10-CM | POA: Diagnosis present

## 2018-02-18 DIAGNOSIS — Z79899 Other long term (current) drug therapy: Secondary | ICD-10-CM

## 2018-02-18 DIAGNOSIS — Z880 Allergy status to penicillin: Secondary | ICD-10-CM

## 2018-02-18 DIAGNOSIS — Z884 Allergy status to anesthetic agent status: Secondary | ICD-10-CM

## 2018-02-18 DIAGNOSIS — R402140 Coma scale, eyes open, spontaneous, unspecified time: Secondary | ICD-10-CM | POA: Diagnosis present

## 2018-02-18 DIAGNOSIS — E1110 Type 2 diabetes mellitus with ketoacidosis without coma: Secondary | ICD-10-CM | POA: Diagnosis present

## 2018-02-18 DIAGNOSIS — F419 Anxiety disorder, unspecified: Secondary | ICD-10-CM | POA: Diagnosis present

## 2018-02-18 DIAGNOSIS — E10649 Type 1 diabetes mellitus with hypoglycemia without coma: Secondary | ICD-10-CM | POA: Diagnosis present

## 2018-02-18 DIAGNOSIS — E785 Hyperlipidemia, unspecified: Secondary | ICD-10-CM | POA: Diagnosis present

## 2018-02-18 DIAGNOSIS — E860 Dehydration: Secondary | ICD-10-CM | POA: Diagnosis present

## 2018-02-18 DIAGNOSIS — Z888 Allergy status to other drugs, medicaments and biological substances status: Secondary | ICD-10-CM

## 2018-02-18 DIAGNOSIS — K3184 Gastroparesis: Secondary | ICD-10-CM | POA: Diagnosis present

## 2018-02-18 DIAGNOSIS — E1143 Type 2 diabetes mellitus with diabetic autonomic (poly)neuropathy: Secondary | ICD-10-CM | POA: Diagnosis present

## 2018-02-18 DIAGNOSIS — Z7982 Long term (current) use of aspirin: Secondary | ICD-10-CM

## 2018-02-18 DIAGNOSIS — R479 Unspecified speech disturbances: Secondary | ICD-10-CM | POA: Diagnosis not present

## 2018-02-18 DIAGNOSIS — R4182 Altered mental status, unspecified: Secondary | ICD-10-CM | POA: Diagnosis not present

## 2018-02-18 DIAGNOSIS — E1165 Type 2 diabetes mellitus with hyperglycemia: Secondary | ICD-10-CM | POA: Diagnosis not present

## 2018-02-18 DIAGNOSIS — R1084 Generalized abdominal pain: Secondary | ICD-10-CM | POA: Diagnosis not present

## 2018-02-18 DIAGNOSIS — R079 Chest pain, unspecified: Secondary | ICD-10-CM | POA: Diagnosis not present

## 2018-02-18 DIAGNOSIS — R1013 Epigastric pain: Secondary | ICD-10-CM | POA: Diagnosis not present

## 2018-02-18 DIAGNOSIS — R402250 Coma scale, best verbal response, oriented, unspecified time: Secondary | ICD-10-CM | POA: Diagnosis present

## 2018-02-18 DIAGNOSIS — R402360 Coma scale, best motor response, obeys commands, unspecified time: Secondary | ICD-10-CM | POA: Diagnosis present

## 2018-02-18 DIAGNOSIS — Z9119 Patient's noncompliance with other medical treatment and regimen: Secondary | ICD-10-CM

## 2018-02-18 DIAGNOSIS — Z9111 Patient's noncompliance with dietary regimen: Secondary | ICD-10-CM

## 2018-02-18 DIAGNOSIS — I10 Essential (primary) hypertension: Secondary | ICD-10-CM | POA: Diagnosis present

## 2018-02-18 DIAGNOSIS — Z91199 Patient's noncompliance with other medical treatment and regimen due to unspecified reason: Secondary | ICD-10-CM

## 2018-02-18 LAB — COMPREHENSIVE METABOLIC PANEL
ALT: 28 U/L (ref 0–44)
ANION GAP: 27 — AB (ref 5–15)
AST: 21 U/L (ref 15–41)
Albumin: 3.8 g/dL (ref 3.5–5.0)
Alkaline Phosphatase: 209 U/L — ABNORMAL HIGH (ref 38–126)
BUN: 19 mg/dL (ref 6–20)
CHLORIDE: 98 mmol/L (ref 98–111)
CO2: 13 mmol/L — ABNORMAL LOW (ref 22–32)
CREATININE: 1.63 mg/dL — AB (ref 0.44–1.00)
Calcium: 10 mg/dL (ref 8.9–10.3)
GFR, EST AFRICAN AMERICAN: 41 mL/min — AB (ref >60)
GFR, EST NON AFRICAN AMERICAN: 36 mL/min — AB (ref >60)
Glucose, Bld: 522 mg/dL (ref 70–99)
Potassium: 4.1 mmol/L (ref 3.5–5.1)
Sodium: 138 mmol/L (ref 135–145)
Total Bilirubin: 2.4 mg/dL — ABNORMAL HIGH (ref 0.3–1.2)
Total Protein: 8.4 g/dL — ABNORMAL HIGH (ref 6.5–8.1)

## 2018-02-18 LAB — CBG MONITORING, ED: Glucose-Capillary: 477 mg/dL — ABNORMAL HIGH (ref 70–99)

## 2018-02-18 LAB — CBC WITH DIFFERENTIAL/PLATELET
Abs Immature Granulocytes: 0 10*3/uL (ref 0.0–0.1)
Basophils Absolute: 0.1 10*3/uL (ref 0.0–0.1)
Basophils Relative: 1 %
EOS ABS: 0 10*3/uL (ref 0.0–0.7)
EOS PCT: 0 %
HCT: 40.3 % (ref 36.0–46.0)
Hemoglobin: 12.4 g/dL (ref 12.0–15.0)
Immature Granulocytes: 0 %
Lymphocytes Relative: 10 %
Lymphs Abs: 1.2 10*3/uL (ref 0.7–4.0)
MCH: 28.4 pg (ref 26.0–34.0)
MCHC: 30.8 g/dL (ref 30.0–36.0)
MCV: 92.4 fL (ref 78.0–100.0)
MONO ABS: 0.5 10*3/uL (ref 0.1–1.0)
MONOS PCT: 4 %
Neutro Abs: 10.2 10*3/uL — ABNORMAL HIGH (ref 1.7–7.7)
Neutrophils Relative %: 85 %
PLATELETS: 291 10*3/uL (ref 150–400)
RBC: 4.36 MIL/uL (ref 3.87–5.11)
RDW: 13.7 % (ref 11.5–15.5)
WBC: 12 10*3/uL — ABNORMAL HIGH (ref 4.0–10.5)

## 2018-02-18 LAB — I-STAT CHEM 8, ED
BUN: 20 mg/dL (ref 6–20)
CALCIUM ION: 1.17 mmol/L (ref 1.15–1.40)
Chloride: 105 mmol/L (ref 98–111)
Creatinine, Ser: 1 mg/dL (ref 0.44–1.00)
GLUCOSE: 535 mg/dL — AB (ref 70–99)
HCT: 40 % (ref 36.0–46.0)
HEMOGLOBIN: 13.6 g/dL (ref 12.0–15.0)
Potassium: 4 mmol/L (ref 3.5–5.1)
SODIUM: 136 mmol/L (ref 135–145)
TCO2: 16 mmol/L — ABNORMAL LOW (ref 22–32)

## 2018-02-18 LAB — I-STAT VENOUS BLOOD GAS, ED
ACID-BASE DEFICIT: 11 mmol/L — AB (ref 0.0–2.0)
Bicarbonate: 16 mmol/L — ABNORMAL LOW (ref 20.0–28.0)
O2 SAT: 78 %
PO2 VEN: 51 mmHg — AB (ref 32.0–45.0)
TCO2: 17 mmol/L — ABNORMAL LOW (ref 22–32)
pCO2, Ven: 40 mmHg — ABNORMAL LOW (ref 44.0–60.0)
pH, Ven: 7.209 — ABNORMAL LOW (ref 7.250–7.430)

## 2018-02-18 LAB — I-STAT TROPONIN, ED: Troponin i, poc: 0.02 ng/mL (ref 0.00–0.08)

## 2018-02-18 LAB — LIPASE, BLOOD: LIPASE: 31 U/L (ref 11–51)

## 2018-02-18 MED ORDER — DEXTROSE-NACL 5-0.45 % IV SOLN: INTRAVENOUS | Status: DC

## 2018-02-18 MED ORDER — METOCLOPRAMIDE HCL 5 MG/ML IJ SOLN
10.0000 mg | Freq: Once | INTRAMUSCULAR | Status: AC
  Administered 2018-02-18: 10 mg via INTRAVENOUS
  Filled 2018-02-18: qty 2

## 2018-02-18 MED ORDER — PROMETHAZINE HCL 25 MG/ML IJ SOLN
25.0000 mg | Freq: Once | INTRAMUSCULAR | Status: AC
  Administered 2018-02-18: 25 mg via INTRAVENOUS
  Filled 2018-02-18: qty 1

## 2018-02-18 MED ORDER — DIPHENHYDRAMINE HCL 50 MG/ML IJ SOLN
25.0000 mg | Freq: Once | INTRAMUSCULAR | Status: AC
  Administered 2018-02-18: 25 mg via INTRAVENOUS
  Filled 2018-02-18: qty 1

## 2018-02-18 MED ORDER — LACTATED RINGERS IV BOLUS
1000.0000 mL | Freq: Once | INTRAVENOUS | Status: AC
  Administered 2018-02-18: 1000 mL via INTRAVENOUS

## 2018-02-18 MED ORDER — ONDANSETRON HCL 4 MG/2ML IJ SOLN
4.0000 mg | Freq: Once | INTRAMUSCULAR | Status: AC
  Administered 2018-02-18: 4 mg via INTRAVENOUS

## 2018-02-18 MED ORDER — SODIUM CHLORIDE 0.9 % IV SOLN
INTRAVENOUS | Status: DC
  Administered 2018-02-19: 4.2 [IU]/h via INTRAVENOUS
  Filled 2018-02-18: qty 1

## 2018-02-18 MED ORDER — SODIUM CHLORIDE 0.9 % IV BOLUS
1000.0000 mL | Freq: Once | INTRAVENOUS | Status: AC
  Administered 2018-02-18: 1000 mL via INTRAVENOUS

## 2018-02-18 NOTE — ED Triage Notes (Addendum)
Pt presents from home with [facility] where family called 911 because patient wasn't acting right, suspected high CBG, but not sure if patient took any medication for it; pt vomiting and curently non-verbal which is unusual for patient, normally able to carry conversation; pt given 4mg  zofran by ems IM EMS reports patient recently discharge from hospital for same and had hx of gastroparesis

## 2018-02-18 NOTE — ED Provider Notes (Signed)
[facility] EMERGENCY DEPARTMENT Provider Note   CSN: [id number] Arrival date & time: 02/18/18  2141     History   Chief Complaint Chief Complaint  Patient presents with  . Emesis  . Altered Mental Status  . Hyperglycemia    HPI [name] is a 52 y.o. female.  The history is provided by the patient. The history is limited by the condition of the patient.  Emesis   This is a new problem. Episode onset: patient unable to specify. Progression since onset: worsening. The emesis has an appearance of stomach contents. There has been no fever. Associated symptoms include abdominal pain. Pertinent negatives include no chills and no fever.    Past Medical History:  Diagnosis Date  . AKI (acute kidney injury) ([place])   . Anemia, iron deficiency   . Anxiety and depression 05/18/2015  . CAD (coronary artery disease), native coronary artery 01/11/2018   Non-STEMI with severe three-vessel disease noted July 2019  . Depression   . Diabetes mellitus without complication ([place])   . Diabetic gastroparesis ([place])   . Diabetic neuropathy, type I diabetes mellitus ([place]) 05/18/2015  . DKA (diabetic ketoacidoses) ([place]) 06/16/2017  . DKA, type 1 ([place]) 03/18/2016  . Essential hypertension   . Gastroparesis   . GERD (gastroesophageal reflux disease)   . HLD (hyperlipidemia)   . MDD (major depressive disorder), single episode, severe , no psychosis ([place])   . Non-ST elevation (NSTEMI) myocardial infarction ([place])   . S/P CABG x 3 01/14/2018  . Tardive dyskinesia   . Vitamin B12 deficiency 08/16/2015    Patient Active Problem List   Diagnosis Date Noted  . S/P CABG x 3 01/14/2018  . CAD (coronary artery disease), native coronary artery 01/11/2018  . Non-ST elevation (NSTEMI) myocardial infarction ([place])   . MDD (major depressive disorder), single episode, severe , no psychosis ([place])   . DKA (diabetic ketoacidoses) ([place]) 06/16/2017  . Tardive dyskinesia   . DKA, type 1 ([place])  03/18/2016  . Noncompliance with treatment plan 03/13/2016  . Essential hypertension 01/24/2016  . HLD (hyperlipidemia)   . GERD (gastroesophageal reflux disease)   . AKI (acute kidney injury) ([place])   . Anemia, iron deficiency   . Vitamin B12 deficiency 08/16/2015  . Diabetic gastroparesis ([place])   . Diabetic neuropathy, type I diabetes mellitus ([place]) 05/18/2015  . Anxiety and depression 05/18/2015    Past Surgical History:  Procedure Laterality Date  . COLONOSCOPY  09/27/2014   at [facility]  . CORONARY ARTERY BYPASS GRAFT N/A 01/14/2018   Procedure: CORONARY ARTERY BYPASS GRAFTING (CABG)X3, RIGHT AND LEFT SAPHENOUS VEIN HARVEST, MAMMARY TAKE DOWN. MAMMARY TO LAD, SVG TO PD, SVG TO DISTAL CIRC.;  Surgeon: Grace Isaac, MD;  Location: [facility];  Service: Open Heart Surgery;  Laterality: N/A;  . ESOPHAGOGASTRODUODENOSCOPY  09/27/2014   at [facility], Dr Rolan Lipa. biospy neg for celiac, neg for H pylori.   . EYE SURGERY    . gailstones    . IR FLUORO GUIDE CV LINE RIGHT  02/01/2017  . IR FLUORO GUIDE CV LINE RIGHT  03/06/2017  . IR FLUORO GUIDE CV LINE RIGHT  03/25/2017  . IR GENERIC HISTORICAL  01/24/2016   IR FLUORO GUIDE CV LINE RIGHT 01/24/2016 Darrell K Allred, PA-C [facility]  . IR GENERIC HISTORICAL  01/24/2016   IR US GUIDE VASC ACCESS RIGHT 01/24/2016 Darrell K Allred, PA-C [facility]  . IR US GUIDE VASC ACCESS RIGHT  02/01/2017  .  IR US GUIDE VASC ACCESS RIGHT  03/06/2017  . IR US GUIDE VASC ACCESS RIGHT  03/25/2017  . LEFT HEART CATH AND CORONARY ANGIOGRAPHY N/A 01/07/2018   Procedure: LEFT HEART CATH AND CORONARY ANGIOGRAPHY;  Surgeon: Troy Sine, MD;  Location: [facility] CV [facility];  Service: Cardiovascular;  Laterality: N/A;  . POSTERIOR VITRECTOMY AND MEMBRANE PEEL-LEFT EYE  09/28/2002  . POSTERIOR VITRECTOMY AND MEMBRANE PEEL-RIGHT EYE  03/16/2002  . RETINAL DETACHMENT SURGERY    . TEE WITHOUT CARDIOVERSION N/A 01/14/2018   Procedure: TRANSESOPHAGEAL ECHOCARDIOGRAM (TEE);   Surgeon: Grace Isaac, MD;  Location: [facility];  Service: Open Heart Surgery;  Laterality: N/A;     OB History   None      Home Medications    Prior to Admission medications   Medication Sig Start Date End Date Taking? Authorizing Provider  acetaminophen (TYLENOL) 325 MG tablet Take 2 tablets (650 mg total) by mouth every 6 (six) hours as needed for mild pain. 01/25/18   Elgie Collard, PA-C  aspirin EC 325 MG EC tablet Take 1 tablet (325 mg total) by mouth daily. 01/25/18   Elgie Collard, PA-C  atorvastatin (LIPITOR) 80 MG tablet Take 1 tablet (80 mg total) by mouth daily at 6 PM. 01/25/18   Elgie Collard, PA-C  busPIRone (BUSPAR) 10 MG tablet Take 1 tablet (10 mg total) by mouth 3 (three) times daily. 11/28/17   [name], MD  diphenhydrAMINE (BENADRYL) 25 mg capsule Take 25 mg by mouth every 6 (six) hours as needed for allergies.    [provider]  docusate sodium (COLACE) 100 MG capsule Take 1 capsule (100 mg total) by mouth daily as needed for mild constipation. 02/03/18   Burtis Junes, NP  fluticasone (FLONASE) 50 MCG/ACT nasal spray Place 2 sprays into both nostrils as needed for rhinitis.    [provider]  furosemide (LASIX) 20 MG tablet Take 1 tablet (20 mg total) by mouth daily. 01/25/18   Elgie Collard, PA-C  gabapentin (NEURONTIN) 600 MG tablet Take 600 mg by mouth 2 (two) times daily.    [provider]  guaiFENesin (MUCINEX) 600 MG 12 hr tablet Take 600 mg by mouth 2 (two) times daily.    [provider]  insulin aspart (NOVOLOG) 100 UNIT/ML injection Inject 0-12 Units into the skin 4 (four) times daily -  before meals and at bedtime. Uses a sliding scale: 70-119 = 0 units, 120-150 = 2 units, 151-180 =  4 units, 181-210 = 5 units, 211-240 = 6 units, 241-270 = 8 units, 271-300 = 9 units, 301-330 = 10 units, 331-360 = 11 units, 361-400 = 12 units, greater than 400 notify MD.    [provider]  insulin detemir (LEVEMIR) 100  UNIT/ML injection Inject 0.15 mLs (15 Units total) into the skin every morning. 11/28/17   [name], MD  metoCLOPramide (REGLAN) 10 MG tablet Take 1 tablet (10 mg total) by mouth every 6 (six) hours as needed for nausea (nausea/headache). 01/28/18   Jacqlyn Larsen, PA-C  metoprolol tartrate (LOPRESSOR) 25 MG tablet Take 0.5 tablets (12.5 mg total) by mouth 2 (two) times daily. 01/25/18   Elgie Collard, PA-C  pantoprazole (PROTONIX) 40 MG tablet Take 1 tablet (40 mg total) by mouth daily. 11/28/17   [name], MD  potassium chloride (K-DUR,KLOR-CON) 10 MEQ tablet Take 1 tablet (10 mEq total) by mouth daily. 01/25/18   Elgie Collard, PA-C  promethazine (PHENERGAN)  12.5 MG tablet Take 1 tablet (12.5 mg total) by mouth every 6 (six) hours as needed for nausea or vomiting. 01/25/18   Elgie Collard, PA-C  pyridostigmine (MESTINON) 60 MG tablet Take 0.5 tablets (30 mg total) by mouth 3 (three) times daily. Take 1/2 with meals 11/11/17   Eugenie Filler, MD    Family History Family History  Problem Relation Age of Onset  . Cystic fibrosis Mother   . Hypertension Father   . Diabetes Brother   . Hypertension Maternal Grandmother     Social History Social History   Tobacco Use  . Smoking status: Never Smoker  . Smokeless tobacco: Never Used  Substance Use Topics  . Alcohol use: No  . Drug use: No     Allergies   Anesthetics, amide; Penicillins; Buprenorphine hcl; Encainide; and Metoclopramide   Review of Systems Review of Systems  Unable to perform ROS: Acuity of condition  Constitutional: Negative for chills and fever.  HENT: Negative for congestion and rhinorrhea.   Respiratory: Negative for shortness of breath.   Cardiovascular: Negative for chest pain.  Gastrointestinal: Positive for abdominal pain and vomiting.  Genitourinary: Negative for dysuria.  Skin: Positive for wound (right medial leg and chest (s/p vein graft from recent CABG)).     Physical Exam Updated Vital  Signs BP (!) 155/73   Pulse (!) 115   Temp 98.2 F (36.8 C) (Oral)   Resp (!) 23   SpO2 100%   Physical Exam  Constitutional: She appears well-developed and well-nourished. She appears listless. She appears distressed.  HENT:  Head: Normocephalic and atraumatic.  Eyes: Conjunctivae are normal.  Neck: Neck supple.  Cardiovascular: Regular rhythm. Tachycardia present.  No murmur heard. Pulses:      Radial pulses are 2+ on the right side, and 2+ on the left side.  Pulmonary/Chest: Breath sounds normal. Accessory muscle usage present. Tachypnea noted. She has no wheezes.  Abdominal: Soft. She exhibits no distension. There is tenderness (epigastric).  Musculoskeletal: She exhibits no edema.  Neurological: She appears listless. GCS eye subscore is 3. GCS verbal subscore is 4. GCS motor subscore is 6.  Skin: Skin is warm and dry.  Psychiatric: She is inattentive.  Nursing note and vitals reviewed.    ED Treatments / Results  Labs (all labs ordered are listed, but only abnormal results are displayed) Labs Reviewed  CBC WITH DIFFERENTIAL/PLATELET - Abnormal; Notable for the following components:      Result Value   WBC 12.0 (*)    Neutro Abs 10.2 (*)    All other components within normal limits  COMPREHENSIVE METABOLIC PANEL - Abnormal; Notable for the following components:   CO2 13 (*)    Glucose, Bld 522 (*)    Creatinine, Ser 1.63 (*)    Total Protein 8.4 (*)    Alkaline Phosphatase 209 (*)    Total Bilirubin 2.4 (*)    GFR calc non Af Amer 36 (*)    GFR calc Af Amer 41 (*)    Anion gap 27 (*)    All other components within normal limits  URINALYSIS, COMPLETE (UACMP) WITH MICROSCOPIC - Abnormal; Notable for the following components:   APPearance HAZY (*)    Glucose, UA >=500 (*)    Hgb urine dipstick SMALL (*)    Ketones, ur 80 (*)    Protein, ur 100 (*)    Bacteria, UA RARE (*)    All other components within normal limits  I-STAT CHEM 8,  ED - Abnormal; Notable for  the following components:   Glucose, Bld 535 (*)    TCO2 16 (*)    All other components within normal limits  CBG MONITORING, ED - Abnormal; Notable for the following components:   Glucose-Capillary 477 (*)    All other components within normal limits  I-STAT VENOUS BLOOD GAS, ED - Abnormal; Notable for the following components:   pH, Ven 7.209 (*)    pCO2, Ven 40.0 (*)    pO2, Ven 51.0 (*)    Bicarbonate 16.0 (*)    TCO2 17 (*)    Acid-base deficit 11.0 (*)    All other components within normal limits  CBG MONITORING, ED - Abnormal; Notable for the following components:   Glucose-Capillary 437 (*)    All other components within normal limits  LIPASE, BLOOD  BLOOD GAS, VENOUS  I-STAT TROPONIN, ED    EKG None  Radiology Dg Chest 2 View  Result Date: 02/17/2018 CLINICAL DATA:  Follow-up CABG.  Shortness of breath with exertion. EXAM: CHEST - 2 VIEW COMPARISON:  02/14/2018 FINDINGS: Previous median sternotomy and CABG. Heart size is normal. Mediastinal shadows are otherwise normal. The lungs are clear. The vascularity is normal. No pneumothorax or hemothorax. No significant bone finding. IMPRESSION: Previous CABG.  No active disease. Electronically Signed   By: Nelson Chimes M.D.   On: 02/17/2018 14:59   Dg Chest Port 1 View  Result Date: 02/18/2018 CLINICAL DATA:  Vomiting and difficulty speaking EXAM: PORTABLE CHEST 1 VIEW COMPARISON:  Chest radiograph 02/17/2018 FINDINGS: The heart size and mediastinal contours are within normal limits. Both lungs are clear. The visualized skeletal structures are unremarkable. CABG sequelae. IMPRESSION: Clear lungs. Electronically Signed   By: Ulyses Jarred M.D.   On: 02/18/2018 22:51    Procedures Procedures (including critical care time)  Medications Ordered in ED Medications  dextrose 5 %-0.45 % sodium chloride infusion ( Intravenous Hold 02/18/18 2323)  insulin regular (NOVOLIN R,HUMULIN R) 100 Units in sodium chloride 0.9 % 100 mL (1  Units/mL) infusion (7.5 Units/hr Intravenous Rate/Dose Change 02/19/18 0100)  sodium chloride 0.9 % bolus 1,000 mL (0 mLs Intravenous Stopped 02/18/18 2255)  metoCLOPramide (REGLAN) injection 10 mg (10 mg Intravenous Given 02/18/18 2204)  diphenhydrAMINE (BENADRYL) injection 25 mg (25 mg Intravenous Given 02/18/18 2204)  promethazine (PHENERGAN) injection 25 mg (25 mg Intravenous Given 02/18/18 2204)  lactated ringers bolus 1,000 mL (0 mLs Intravenous Stopped 02/19/18 0053)  ondansetron (ZOFRAN) injection 4 mg (4 mg Intravenous Given 02/18/18 2353)     Initial Impression / Assessment and Plan / ED Course  I have reviewed the triage vital signs and the nursing notes.  Pertinent labs & imaging results that were available during my care of the patient were reviewed by me and considered in my medical decision making (see chart for details).     The patient is a 46yoF with PMH DM2 who presents from home with hyperglycemia. The patient was discharged from the hospitalist service 2 days ago after an admission for DKA. The patient denies fevers, chills, infectious symptoms. She is unable to answer if she has been compliant with her insulin. The patient endorses only abdominal pain, nausea, and vomiting. She is confused and not able to answer questions about her insulin regimen. VBG shows metabolic acidosis. CBC reveals mild leukocytosis. CMP reveals hyperglycemia, low CO2, and anion gap consistent with DKA. Patient given 2L IV fluids in ED and placed on insulin gtt and maintenance fluids. Patient  given phenergan for nausea and had some redness around her IV site afterwards. Patient had no shortness of breath, facial swelling, or change in symptoms after administration of the phenergan. Patient reports improvement in nausea. Due the patient's confusion, ordered CT head and this is pending. Discussed admission with hospitalist service as she was discharged from their service 2 days ago and likely patient will be  admitted to their service pending CT head.  Patient care supervised by Dr. Darl Householder.  Irven [place], MD  Final Clinical Impressions(s) / ED Diagnoses   Final diagnoses:  Diabetic ketoacidosis without coma associated with type 1 diabetes mellitus [facility])    ED Discharge Orders    None       Irven [place], MD 02/19/18 0136    Drenda Freeze, MD 02/19/18 [phone]

## 2018-02-19 ENCOUNTER — Other Ambulatory Visit: Payer: Self-pay

## 2018-02-19 ENCOUNTER — Emergency Department (HOSPITAL_COMMUNITY): Payer: Medicare Other

## 2018-02-19 ENCOUNTER — Encounter (HOSPITAL_COMMUNITY): Payer: Self-pay | Admitting: Family Medicine

## 2018-02-19 DIAGNOSIS — E1043 Type 1 diabetes mellitus with diabetic autonomic (poly)neuropathy: Secondary | ICD-10-CM | POA: Diagnosis present

## 2018-02-19 DIAGNOSIS — Z951 Presence of aortocoronary bypass graft: Secondary | ICD-10-CM | POA: Diagnosis not present

## 2018-02-19 DIAGNOSIS — F329 Major depressive disorder, single episode, unspecified: Secondary | ICD-10-CM | POA: Diagnosis not present

## 2018-02-19 DIAGNOSIS — E860 Dehydration: Secondary | ICD-10-CM | POA: Diagnosis present

## 2018-02-19 DIAGNOSIS — N179 Acute kidney failure, unspecified: Secondary | ICD-10-CM | POA: Diagnosis not present

## 2018-02-19 DIAGNOSIS — Z888 Allergy status to other drugs, medicaments and biological substances status: Secondary | ICD-10-CM | POA: Diagnosis not present

## 2018-02-19 DIAGNOSIS — K219 Gastro-esophageal reflux disease without esophagitis: Secondary | ICD-10-CM | POA: Diagnosis present

## 2018-02-19 DIAGNOSIS — Z884 Allergy status to anesthetic agent status: Secondary | ICD-10-CM | POA: Diagnosis not present

## 2018-02-19 DIAGNOSIS — R402140 Coma scale, eyes open, spontaneous, unspecified time: Secondary | ICD-10-CM | POA: Diagnosis present

## 2018-02-19 DIAGNOSIS — Z9119 Patient's noncompliance with other medical treatment and regimen: Secondary | ICD-10-CM | POA: Diagnosis not present

## 2018-02-19 DIAGNOSIS — E785 Hyperlipidemia, unspecified: Secondary | ICD-10-CM | POA: Diagnosis present

## 2018-02-19 DIAGNOSIS — E10649 Type 1 diabetes mellitus with hypoglycemia without coma: Secondary | ICD-10-CM | POA: Diagnosis present

## 2018-02-19 DIAGNOSIS — K3184 Gastroparesis: Secondary | ICD-10-CM

## 2018-02-19 DIAGNOSIS — E1010 Type 1 diabetes mellitus with ketoacidosis without coma: Secondary | ICD-10-CM | POA: Diagnosis present

## 2018-02-19 DIAGNOSIS — I10 Essential (primary) hypertension: Secondary | ICD-10-CM | POA: Diagnosis present

## 2018-02-19 DIAGNOSIS — I2510 Atherosclerotic heart disease of native coronary artery without angina pectoris: Secondary | ICD-10-CM

## 2018-02-19 DIAGNOSIS — Z880 Allergy status to penicillin: Secondary | ICD-10-CM | POA: Diagnosis not present

## 2018-02-19 DIAGNOSIS — Z885 Allergy status to narcotic agent status: Secondary | ICD-10-CM | POA: Diagnosis not present

## 2018-02-19 DIAGNOSIS — R4182 Altered mental status, unspecified: Secondary | ICD-10-CM | POA: Diagnosis not present

## 2018-02-19 DIAGNOSIS — I252 Old myocardial infarction: Secondary | ICD-10-CM | POA: Diagnosis not present

## 2018-02-19 DIAGNOSIS — D72829 Elevated white blood cell count, unspecified: Secondary | ICD-10-CM | POA: Diagnosis present

## 2018-02-19 DIAGNOSIS — F419 Anxiety disorder, unspecified: Secondary | ICD-10-CM | POA: Diagnosis not present

## 2018-02-19 DIAGNOSIS — R402250 Coma scale, best verbal response, oriented, unspecified time: Secondary | ICD-10-CM | POA: Diagnosis present

## 2018-02-19 DIAGNOSIS — E0810 Diabetes mellitus due to underlying condition with ketoacidosis without coma: Secondary | ICD-10-CM | POA: Diagnosis not present

## 2018-02-19 DIAGNOSIS — R402360 Coma scale, best motor response, obeys commands, unspecified time: Secondary | ICD-10-CM | POA: Diagnosis present

## 2018-02-19 DIAGNOSIS — E1143 Type 2 diabetes mellitus with diabetic autonomic (poly)neuropathy: Secondary | ICD-10-CM

## 2018-02-19 DIAGNOSIS — R000 Tachycardia, unspecified: Secondary | ICD-10-CM | POA: Diagnosis present

## 2018-02-19 LAB — GLUCOSE, CAPILLARY
GLUCOSE-CAPILLARY: 461 mg/dL — AB (ref 70–99)
GLUCOSE-CAPILLARY: 115 mg/dL — AB (ref 70–99)
GLUCOSE-CAPILLARY: 112 mg/dL — AB (ref 70–99)
GLUCOSE-CAPILLARY: 166 mg/dL — AB (ref 70–99)
GLUCOSE-CAPILLARY: 215 mg/dL — AB (ref 70–99)
Glucose-Capillary: 151 mg/dL — ABNORMAL HIGH (ref 70–99)
Glucose-Capillary: 115 mg/dL — ABNORMAL HIGH (ref 70–99)
Glucose-Capillary: 126 mg/dL — ABNORMAL HIGH (ref 70–99)
Glucose-Capillary: 165 mg/dL — ABNORMAL HIGH (ref 70–99)
Glucose-Capillary: 160 mg/dL — ABNORMAL HIGH (ref 70–99)
Glucose-Capillary: 155 mg/dL — ABNORMAL HIGH (ref 70–99)
Glucose-Capillary: 162 mg/dL — ABNORMAL HIGH (ref 70–99)
Glucose-Capillary: 193 mg/dL — ABNORMAL HIGH (ref 70–99)
Glucose-Capillary: 178 mg/dL — ABNORMAL HIGH (ref 70–99)

## 2018-02-19 LAB — BASIC METABOLIC PANEL
ANION GAP: 13 (ref 5–15)
Anion gap: 18 — ABNORMAL HIGH (ref 5–15)
Anion gap: 14 (ref 5–15)
Anion gap: 14 (ref 5–15)
Anion gap: 15 (ref 5–15)
BUN: 18 mg/dL (ref 6–20)
BUN: 16 mg/dL (ref 6–20)
BUN: 19 mg/dL (ref 6–20)
BUN: 18 mg/dL (ref 6–20)
BUN: 16 mg/dL (ref 6–20)
CHLORIDE: 107 mmol/L (ref 98–111)
CHLORIDE: 111 mmol/L (ref 98–111)
CHLORIDE: 112 mmol/L — AB (ref 98–111)
CO2: 13 mmol/L — ABNORMAL LOW (ref 22–32)
CO2: 15 mmol/L — ABNORMAL LOW (ref 22–32)
CO2: 19 mmol/L — ABNORMAL LOW (ref 22–32)
CO2: 16 mmol/L — ABNORMAL LOW (ref 22–32)
CO2: 21 mmol/L — ABNORMAL LOW (ref 22–32)
CREATININE: 1.55 mg/dL — AB (ref 0.44–1.00)
CREATININE: 1.4 mg/dL — AB (ref 0.44–1.00)
CREATININE: 1.47 mg/dL — AB (ref 0.44–1.00)
CREATININE: 1.25 mg/dL — AB (ref 0.44–1.00)
Calcium: 9.5 mg/dL (ref 8.9–10.3)
Calcium: 8.5 mg/dL — ABNORMAL LOW (ref 8.9–10.3)
Calcium: 10 mg/dL (ref 8.9–10.3)
Calcium: 9.7 mg/dL (ref 8.9–10.3)
Calcium: 10.2 mg/dL (ref 8.9–10.3)
Chloride: 110 mmol/L (ref 98–111)
Chloride: 109 mmol/L (ref 98–111)
Creatinine, Ser: 1.25 mg/dL — ABNORMAL HIGH (ref 0.44–1.00)
GFR calc Af Amer: 44 mL/min — ABNORMAL LOW (ref >60)
GFR calc Af Amer: 50 mL/min — ABNORMAL LOW (ref >60)
GFR calc Af Amer: 47 mL/min — ABNORMAL LOW (ref >60)
GFR calc Af Amer: 57 mL/min — ABNORMAL LOW (ref >60)
GFR calc Af Amer: 57 mL/min — ABNORMAL LOW (ref >60)
GFR calc non Af Amer: 41 mL/min — ABNORMAL LOW (ref >60)
GFR calc non Af Amer: 49 mL/min — ABNORMAL LOW (ref >60)
GFR calc non Af Amer: 49 mL/min — ABNORMAL LOW (ref >60)
GFR, EST NON AFRICAN AMERICAN: 38 mL/min — AB (ref >60)
GFR, EST NON AFRICAN AMERICAN: 43 mL/min — AB (ref >60)
GLUCOSE: 269 mg/dL — AB (ref 70–99)
GLUCOSE: 566 mg/dL — AB (ref 70–99)
GLUCOSE: 162 mg/dL — AB (ref 70–99)
GLUCOSE: 127 mg/dL — AB (ref 70–99)
Glucose, Bld: 163 mg/dL — ABNORMAL HIGH (ref 70–99)
POTASSIUM: 3.2 mmol/L — AB (ref 3.5–5.1)
POTASSIUM: 4 mmol/L (ref 3.5–5.1)
POTASSIUM: 4.2 mmol/L (ref 3.5–5.1)
POTASSIUM: 3.5 mmol/L (ref 3.5–5.1)
Potassium: 4.1 mmol/L (ref 3.5–5.1)
SODIUM: 141 mmol/L (ref 135–145)
SODIUM: 136 mmol/L (ref 135–145)
Sodium: 144 mmol/L (ref 135–145)
Sodium: 143 mmol/L (ref 135–145)
Sodium: 143 mmol/L (ref 135–145)

## 2018-02-19 LAB — URINALYSIS, COMPLETE (UACMP) WITH MICROSCOPIC
BILIRUBIN URINE: NEGATIVE
Ketones, ur: 80 mg/dL — AB
LEUKOCYTES UA: NEGATIVE
NITRITE: NEGATIVE
PH: 6 (ref 5.0–8.0)
Protein, ur: 100 mg/dL — AB
Specific Gravity, Urine: 1.023 (ref 1.005–1.030)

## 2018-02-19 LAB — HEMOGLOBIN A1C
Hgb A1c MFr Bld: 8.2 % — ABNORMAL HIGH (ref 4.8–5.6)
Mean Plasma Glucose: 188.64 mg/dL

## 2018-02-19 LAB — CBG MONITORING, ED
GLUCOSE-CAPILLARY: 392 mg/dL — AB (ref 70–99)
GLUCOSE-CAPILLARY: 241 mg/dL — AB (ref 70–99)
GLUCOSE-CAPILLARY: 224 mg/dL — AB (ref 70–99)
GLUCOSE-CAPILLARY: 206 mg/dL — AB (ref 70–99)
GLUCOSE-CAPILLARY: 166 mg/dL — AB (ref 70–99)
Glucose-Capillary: 257 mg/dL — ABNORMAL HIGH (ref 70–99)
Glucose-Capillary: 324 mg/dL — ABNORMAL HIGH (ref 70–99)
Glucose-Capillary: 437 mg/dL — ABNORMAL HIGH (ref 70–99)

## 2018-02-19 LAB — MAGNESIUM: Magnesium: 1.8 mg/dL (ref 1.7–2.4)

## 2018-02-19 LAB — MRSA PCR SCREENING: MRSA BY PCR: NEGATIVE

## 2018-02-19 MED ORDER — SODIUM CHLORIDE 0.9 % IV SOLN
INTRAVENOUS | Status: DC
  Administered 2018-02-19 – 2018-02-20 (×3): via INTRAVENOUS

## 2018-02-19 MED ORDER — ATORVASTATIN CALCIUM 80 MG PO TABS
80.0000 mg | ORAL_TABLET | Freq: Every day | ORAL | Status: DC
  Administered 2018-02-19 – 2018-02-20 (×2): 80 mg via ORAL
  Filled 2018-02-19 (×2): qty 1

## 2018-02-19 MED ORDER — ONDANSETRON HCL 4 MG/2ML IJ SOLN
4.0000 mg | Freq: Once | INTRAMUSCULAR | Status: AC
  Administered 2018-02-19: 4 mg via INTRAVENOUS
  Filled 2018-02-19: qty 2

## 2018-02-19 MED ORDER — INSULIN DETEMIR 100 UNIT/ML ~~LOC~~ SOLN
13.0000 [IU] | Freq: Every day | SUBCUTANEOUS | Status: DC
  Administered 2018-02-19 – 2018-02-21 (×2): 13 [IU] via SUBCUTANEOUS
  Filled 2018-02-19 (×3): qty 0.13

## 2018-02-19 MED ORDER — GABAPENTIN 600 MG PO TABS
600.0000 mg | ORAL_TABLET | Freq: Two times a day (BID) | ORAL | Status: DC
  Administered 2018-02-19 – 2018-02-21 (×4): 600 mg via ORAL
  Filled 2018-02-19 (×5): qty 1

## 2018-02-19 MED ORDER — ASPIRIN 325 MG PO TABS
325.0000 mg | ORAL_TABLET | Freq: Every day | ORAL | Status: DC
  Administered 2018-02-19 – 2018-02-21 (×3): 325 mg via ORAL
  Filled 2018-02-19 (×3): qty 1

## 2018-02-19 MED ORDER — KETOROLAC TROMETHAMINE 15 MG/ML IJ SOLN
15.0000 mg | Freq: Four times a day (QID) | INTRAMUSCULAR | Status: DC | PRN
  Administered 2018-02-19: 15 mg via INTRAVENOUS

## 2018-02-19 MED ORDER — METOCLOPRAMIDE HCL 5 MG/ML IJ SOLN
10.0000 mg | Freq: Four times a day (QID) | INTRAMUSCULAR | Status: DC
  Administered 2018-02-19 – 2018-02-20 (×4): 10 mg via INTRAVENOUS
  Filled 2018-02-19 (×4): qty 2

## 2018-02-19 MED ORDER — KETOROLAC TROMETHAMINE 15 MG/ML IJ SOLN: 15.0000 mg | Freq: Four times a day (QID) | INTRAMUSCULAR | Status: AC | PRN

## 2018-02-19 MED ORDER — INSULIN ASPART 100 UNIT/ML ~~LOC~~ SOLN
0.0000 [IU] | Freq: Every day | SUBCUTANEOUS | Status: DC
  Administered 2018-02-19: 2 [IU] via SUBCUTANEOUS
  Administered 2018-02-21: 3 [IU] via SUBCUTANEOUS

## 2018-02-19 MED ORDER — ALPRAZOLAM 0.5 MG PO TABS
0.5000 mg | ORAL_TABLET | Freq: Three times a day (TID) | ORAL | Status: DC | PRN
  Administered 2018-02-19 – 2018-02-20 (×3): 0.5 mg via ORAL
  Filled 2018-02-19 (×3): qty 1

## 2018-02-19 MED ORDER — INSULIN ASPART 100 UNIT/ML ~~LOC~~ SOLN
0.0000 [IU] | Freq: Three times a day (TID) | SUBCUTANEOUS | Status: DC
  Administered 2018-02-20: 3 [IU] via SUBCUTANEOUS
  Administered 2018-02-20: 9 [IU] via SUBCUTANEOUS
  Administered 2018-02-21: 2 [IU] via SUBCUTANEOUS

## 2018-02-19 MED ORDER — BUSPIRONE HCL 5 MG PO TABS
10.0000 mg | ORAL_TABLET | Freq: Three times a day (TID) | ORAL | Status: DC
  Administered 2018-02-19 – 2018-02-21 (×6): 10 mg via ORAL
  Filled 2018-02-19 (×2): qty 1
  Filled 2018-02-19 (×5): qty 2

## 2018-02-19 MED ORDER — HEPARIN SODIUM (PORCINE) 5000 UNIT/ML IJ SOLN
5000.0000 [IU] | Freq: Three times a day (TID) | INTRAMUSCULAR | Status: DC
  Administered 2018-02-19 – 2018-02-21 (×7): 5000 [IU] via SUBCUTANEOUS
  Filled 2018-02-19 (×7): qty 1

## 2018-02-19 MED ORDER — POTASSIUM CHLORIDE 10 MEQ/100ML IV SOLN
10.0000 meq | INTRAVENOUS | Status: AC
  Administered 2018-02-19 (×2): 10 meq via INTRAVENOUS
  Filled 2018-02-19 (×2): qty 100

## 2018-02-19 MED ORDER — ACETAMINOPHEN 325 MG PO TABS
650.0000 mg | ORAL_TABLET | Freq: Four times a day (QID) | ORAL | Status: DC | PRN
  Administered 2018-02-19 – 2018-02-21 (×2): 650 mg via ORAL
  Filled 2018-02-19 (×2): qty 2

## 2018-02-19 MED ORDER — TRAMADOL HCL 50 MG PO TABS
50.0000 mg | ORAL_TABLET | Freq: Four times a day (QID) | ORAL | Status: DC | PRN
  Administered 2018-02-19: 50 mg via ORAL
  Filled 2018-02-19: qty 1

## 2018-02-19 MED ORDER — METOPROLOL TARTRATE 12.5 MG HALF TABLET
12.5000 mg | ORAL_TABLET | Freq: Two times a day (BID) | ORAL | Status: DC
  Administered 2018-02-19 – 2018-02-21 (×4): 12.5 mg via ORAL
  Filled 2018-02-19 (×4): qty 1

## 2018-02-19 MED ORDER — HYDRALAZINE HCL 20 MG/ML IJ SOLN
10.0000 mg | INTRAMUSCULAR | Status: DC | PRN
  Administered 2018-02-19: 10 mg via INTRAVENOUS
  Filled 2018-02-19: qty 1

## 2018-02-19 MED ORDER — INSULIN ASPART 100 UNIT/ML ~~LOC~~ SOLN
3.0000 [IU] | Freq: Three times a day (TID) | SUBCUTANEOUS | Status: DC
  Administered 2018-02-20: 3 [IU] via SUBCUTANEOUS

## 2018-02-19 MED ORDER — ONDANSETRON HCL 4 MG/2ML IJ SOLN
4.0000 mg | Freq: Four times a day (QID) | INTRAMUSCULAR | Status: DC | PRN
  Administered 2018-02-19 – 2018-02-20 (×2): 4 mg via INTRAVENOUS
  Filled 2018-02-19 (×2): qty 2

## 2018-02-19 MED ORDER — METOCLOPRAMIDE HCL 5 MG/ML IJ SOLN
10.0000 mg | Freq: Three times a day (TID) | INTRAMUSCULAR | Status: DC
  Administered 2018-02-19: 10 mg via INTRAVENOUS
  Filled 2018-02-19: qty 2

## 2018-02-19 MED ORDER — PROMETHAZINE HCL 25 MG/ML IJ SOLN
12.5000 mg | Freq: Four times a day (QID) | INTRAMUSCULAR | Status: DC | PRN
  Administered 2018-02-19: 12.5 mg via INTRAVENOUS
  Filled 2018-02-19: qty 1

## 2018-02-19 MED ORDER — DEXTROSE-NACL 5-0.45 % IV SOLN
INTRAVENOUS | Status: DC
  Administered 2018-02-19: 04:00:00 via INTRAVENOUS

## 2018-02-19 MED ORDER — FENTANYL CITRATE (PF) 100 MCG/2ML IJ SOLN
25.0000 ug | Freq: Once | INTRAMUSCULAR | Status: AC
  Administered 2018-02-19: 25 ug via INTRAVENOUS
  Filled 2018-02-19: qty 2

## 2018-02-19 MED ORDER — SODIUM CHLORIDE 0.9 % IV SOLN
INTRAVENOUS | Status: DC
  Administered 2018-02-19: 7.9 [IU]/h via INTRAVENOUS
  Filled 2018-02-19: qty 1

## 2018-02-19 NOTE — ED Notes (Signed)
Pt continues to c/o pain, MD made aware of pt complaints of pain. VSS, pt has not vomited since second admin of zofran. Pt repositioned again, sheets dry, purewick remains in place. Pt is extremely restless, comfort measures offered. Pt denies blankets, states she is hot, cool cloth applied to forehead.

## 2018-02-19 NOTE — Progress Notes (Addendum)
Inpatient Diabetes Program Recommendations  AACE/ADA: New Consensus Statement on Inpatient Glycemic Control (2019)  Target Ranges:  Prepandial:   less than 140 mg/dL      Peak postprandial:   less than 180 mg/dL (1-2 hours)      Critically ill patients:  140 - 180 mg/dL  Results for [name], [name] (MRN [id number]) as of 02/19/2018 09:47  Ref. Range 02/19/2018 00:56 02/19/2018 02:03 02/19/2018 02:56 02/19/2018 03:54 02/19/2018 03:56 02/19/2018 05:00 02/19/2018 06:01 02/19/2018 06:57 02/19/2018 08:24  Glucose-Capillary Latest Ref Range: 70 - 99 mg/dL 437 (H) 392 (H) 324 (H) 257 (H) 241 (H) 224 (H) 206 (H) 166 (H) 151 (H)  Results for [name], [name] (MRN [id number]) as of 02/19/2018 09:47  Ref. Range 02/19/2018 06:30  Glucose Latest Ref Range: 70 - 99 mg/dL [facility])   Results for [name], [name] (MRN [id number]) as of 02/19/2018 09:47  Ref. Range 02/19/2018 08:00  Hemoglobin A1C Latest Ref Range: 4.8 - 5.6 % 8.2 (H)   Review of Glycemic Control  Diabetes history: DM1 Outpatient Diabetes medications: Levemir 15 units QAM, Novolog 0-12 units QID Current orders for Inpatient glycemic control: IV insulin drip  Inpatient Diabetes Program Recommendations:  Insulin - Basal: Once acidosis is cleared and MD is ready to transition from IV to SQ insulin, please consider ordering Levemir 17 units Q24H. Correction (SSI): Once acidosis is cleared and MD is ready to transition from IV to SQ insulin, please consider ordering CBGs with Novolog 0-9 units TID with meals and Novolog 0-5 units QHS. Insulin - Meal Coverage: Once acidosis is cleared and MD is ready to transition from IV to SQ insulin, once diet is ordered please consider ordering Novolog 3 units TID with meals for meal coverage if patient is eating at least 50% of meals.  NOTE: Noted consult for Diabetes Coordinator. Patient is well known to Inpatient Diabetes team as she has frequent hospital admissions (7 admission in last 6 months). Ursula Alert, RN, Inpatient  Diabetes Coordinator spoke with patient at length on 01/17/18 regarding importance of taking insulin even when she is not able to eat (patient notes that gastroparesis causes her not to be able to eat and she does not take insulin). Sick day rules reiterated by Diabetes Coordinator on 01/17/18.   Addendum 02/19/18@13 :30-Spoke with patient regarding DM control and frequent hospitalization with DKA. Patient states that she is feeling too sick to talk but confirms that she is taking Levemir and Novolog as noted in chart. Explained need to take insulin even during sick days when she experiences issues due to gastroparesis. Patient states that she is taking Levemir even when she is not able to eat. Encouraged patient to check glucose at least every 4 hours and take Novolog insulin if needed for correction even on sick days. Explained how stress response can lead to hyperglycemia and importance of taking insulin to prevent DKA reoccurrence.  Patient again stated she did not feel like talking because she was too sick. Provided emotional support and encouraged patient to consistently take insulin as prescribed. Patient verbalized understanding of information discussed and she states that she has no further questions at this time related to DM.  Thanks, Barnie Alderman, RN, MSN, CDE Diabetes Coordinator Inpatient Diabetes Program [phone] (Team Pager from 8am to 5pm)

## 2018-02-19 NOTE — H&P (Signed)
History and Physical    Canyon Willow JJK:[id number] DOB: 05/04/1966 DOA: 02/18/2018  PCP: [name], MD   Patient coming from: Home   Chief Complaint: Confusion, hyperglycemia, N/V   HPI: [name] is a 52 y.o. female with medical history significant for insulin-dependent diabetes mellitus with gastroparesis and frequent DKA, coronary artery disease status post CABG last month, anxiety, and poor adherence with her treatment plan, now presenting to the emergency department for evaluation of confusion, hyperglycemia, and nausea with vomiting.  Patient was just discharged from the hospital 2 days earlier after an admission for DKA and gastroparesis.  She had been doing fairly well back at home initially, but tonight, family noted her to be confused and CBG was elevated.  She has been complaining of nausea and is spitting up.  Patient was not speaking initially on arrival to the ED and unable to provide a history, but has since began to talk and denies any chest pain or shortness of breath but reports pain in the mid and upper abdomen with severe nausea.  ED Course: Upon arrival to the ED, patient is found to be afebrile, saturating well on room air, tachycardic in the 110s, and mildly hypertensive.  Chest x-ray is negative for acute cardiopulmonary disease and noncontrast head CT is negative for acute intracranial abnormality.  Chemistry panel is notable for bicarbonate of 13, anion gap 27, and creatinine 1.63, up from 1.20 two days earlier.  CBC features a leukocytosis of 12,000.  Troponin is negative and urinalysis features ketones and glucosuria.  Patient was given 2 L of IV fluids, Reglan, Phenergan, Benadryl, and was started on insulin infusion in the ED.  She will be admitted for ongoing evaluation and management of recurrent DKA.  Review of Systems:  All other systems reviewed and apart from HPI, are negative.  Past Medical History:  Diagnosis Date  . AKI (acute kidney injury)  ([place])   . Anemia, iron deficiency   . Anxiety and depression 05/18/2015  . CAD (coronary artery disease), native coronary artery 01/11/2018   Non-STEMI with severe three-vessel disease noted July 2019  . Depression   . Diabetes mellitus without complication ([place])   . Diabetic gastroparesis ([place])   . Diabetic neuropathy, type I diabetes mellitus ([place]) 05/18/2015  . DKA (diabetic ketoacidoses) ([place]) 06/16/2017  . DKA, type 1 ([place]) 03/18/2016  . Essential hypertension   . Gastroparesis   . GERD (gastroesophageal reflux disease)   . HLD (hyperlipidemia)   . MDD (major depressive disorder), single episode, severe , no psychosis ([place])   . Non-ST elevation (NSTEMI) myocardial infarction ([place])   . S/P CABG x 3 01/14/2018  . Tardive dyskinesia   . Vitamin B12 deficiency 08/16/2015    Past Surgical History:  Procedure Laterality Date  . COLONOSCOPY  09/27/2014   at [facility]  . CORONARY ARTERY BYPASS GRAFT N/A 01/14/2018   Procedure: CORONARY ARTERY BYPASS GRAFTING (CABG)X3, RIGHT AND LEFT SAPHENOUS VEIN HARVEST, MAMMARY TAKE DOWN. MAMMARY TO LAD, SVG TO PD, SVG TO DISTAL CIRC.;  Surgeon: Grace Isaac, MD;  Location: [facility];  Service: Open Heart Surgery;  Laterality: N/A;  . ESOPHAGOGASTRODUODENOSCOPY  09/27/2014   at [facility], Dr Rolan Lipa. biospy neg for celiac, neg for H pylori.   . EYE SURGERY    . gailstones    . IR FLUORO GUIDE CV LINE RIGHT  02/01/2017  . IR FLUORO GUIDE CV LINE RIGHT  03/06/2017  . IR FLUORO GUIDE CV LINE RIGHT  03/25/2017  .  IR GENERIC HISTORICAL  01/24/2016   IR FLUORO GUIDE CV LINE RIGHT 01/24/2016 Darrell K Allred, PA-C [facility]  . IR GENERIC HISTORICAL  01/24/2016   IR US GUIDE VASC ACCESS RIGHT 01/24/2016 Darrell K Allred, PA-C [facility]  . IR US GUIDE VASC ACCESS RIGHT  02/01/2017  . IR US GUIDE VASC ACCESS RIGHT  03/06/2017  . IR US GUIDE VASC ACCESS RIGHT  03/25/2017  . LEFT HEART CATH AND CORONARY ANGIOGRAPHY N/A 01/07/2018   Procedure: LEFT HEART CATH AND  CORONARY ANGIOGRAPHY;  Surgeon: Troy Sine, MD;  Location: [facility];  Service: Cardiovascular;  Laterality: N/A;  . POSTERIOR VITRECTOMY AND MEMBRANE PEEL-LEFT EYE  09/28/2002  . POSTERIOR VITRECTOMY AND MEMBRANE PEEL-RIGHT EYE  03/16/2002  . RETINAL DETACHMENT SURGERY    . TEE WITHOUT CARDIOVERSION N/A 01/14/2018   Procedure: TRANSESOPHAGEAL ECHOCARDIOGRAM (TEE);  Surgeon: Grace Isaac, MD;  Location: [facility];  Service: Open Heart Surgery;  Laterality: N/A;     reports that she has never smoked. She has never used smokeless tobacco. She reports that she does not drink alcohol or use drugs.  Allergies  Allergen Reactions  . Anesthetics, Amide Nausea And Vomiting  . Penicillins Diarrhea, Nausea And Vomiting and Other (See Comments)    Has patient had a PCN reaction causing immediate rash, facial/tongue/throat swelling, SOB or lightheadedness with hypotension: Yes Has patient had a PCN reaction causing severe rash involving mucus membranes or skin necrosis: No Has patient had a PCN reaction that required hospitalization No Has patient had a PCN reaction occurring within the last 10 years: Yes  If all of the above answers are "NO", then may proceed with Cephalosporin use.   . Buprenorphine Hcl Rash  . Encainide Nausea And Vomiting  . Metoclopramide Other (See Comments)    Dystonia, muscle rigidity.  Patient states she is only allergic to the "pill form, not the IV"    Family History  Problem Relation Age of Onset  . Cystic fibrosis Mother   . Hypertension Father   . Diabetes Brother   . Hypertension Maternal Grandmother      Prior to Admission medications   Medication Sig Start Date End Date Taking? Authorizing Provider  acetaminophen (TYLENOL) 325 MG tablet Take 2 tablets (650 mg total) by mouth every 6 (six) hours as needed for mild pain. 01/25/18   Elgie Collard, PA-C  aspirin EC 325 MG EC tablet Take 1 tablet (325 mg total) by mouth daily. 01/25/18   Elgie Collard, PA-C  atorvastatin (LIPITOR) 80 MG tablet Take 1 tablet (80 mg total) by mouth daily at 6 PM. 01/25/18   Elgie Collard, PA-C  busPIRone (BUSPAR) 10 MG tablet Take 1 tablet (10 mg total) by mouth 3 (three) times daily. 11/28/17   [name], MD  diphenhydrAMINE (BENADRYL) 25 mg capsule Take 25 mg by mouth every 6 (six) hours as needed for allergies.    [provider]  docusate sodium (COLACE) 100 MG capsule Take 1 capsule (100 mg total) by mouth daily as needed for mild constipation. 02/03/18   Burtis Junes, NP  fluticasone (FLONASE) 50 MCG/ACT nasal spray Place 2 sprays into both nostrils as needed for rhinitis.    [provider]  furosemide (LASIX) 20 MG tablet Take 1 tablet (20 mg total) by mouth daily. 01/25/18   Elgie Collard, PA-C  gabapentin (NEURONTIN) 600 MG tablet Take 600 mg by mouth 2 (two) times daily.  [provider]  guaiFENesin (MUCINEX) 600 MG 12 hr tablet Take 600 mg by mouth 2 (two) times daily.    [provider]  insulin aspart (NOVOLOG) 100 UNIT/ML injection Inject 0-12 Units into the skin 4 (four) times daily -  before meals and at bedtime. Uses a sliding scale: 70-119 = 0 units, 120-150 = 2 units, 151-180 =  4 units, 181-210 = 5 units, 211-240 = 6 units, 241-270 = 8 units, 271-300 = 9 units, 301-330 = 10 units, 331-360 = 11 units, 361-400 = 12 units, greater than 400 notify MD.    [provider]  insulin detemir (LEVEMIR) 100 UNIT/ML injection Inject 0.15 mLs (15 Units total) into the skin every morning. 11/28/17   [name], MD  metoCLOPramide (REGLAN) 10 MG tablet Take 1 tablet (10 mg total) by mouth every 6 (six) hours as needed for nausea (nausea/headache). 01/28/18   Jacqlyn Larsen, PA-C  metoprolol tartrate (LOPRESSOR) 25 MG tablet Take 0.5 tablets (12.5 mg total) by mouth 2 (two) times daily. 01/25/18   Elgie Collard, PA-C  pantoprazole (PROTONIX) 40 MG tablet Take 1 tablet (40 mg total) by mouth daily. 11/28/17    [name], MD  potassium chloride (K-DUR,KLOR-CON) 10 MEQ tablet Take 1 tablet (10 mEq total) by mouth daily. 01/25/18   Elgie Collard, PA-C  promethazine (PHENERGAN) 12.5 MG tablet Take 1 tablet (12.5 mg total) by mouth every 6 (six) hours as needed for nausea or vomiting. 01/25/18   Elgie Collard, PA-C  pyridostigmine (MESTINON) 60 MG tablet Take 0.5 tablets (30 mg total) by mouth 3 (three) times daily. Take 1/2 with meals 11/11/17   Eugenie Filler, MD    Physical Exam: Vitals:   02/19/18 0015 02/19/18 0030 02/19/18 0115 02/19/18 0145  BP: (!) 143/50 (!) 153/53 (!) 155/73 (!) 165/74  Pulse: (!) 112 (!) 113 (!) 115 (!) 115  Resp: (!) 26 (!) 27 (!) 23 (!) 33  Temp:      TempSrc:      SpO2: 100% 100% 100% 100%      Constitutional: NAD, appears uncomfortable, clutching emesis bag Eyes: PERTLA, lids and conjunctivae normal ENMT: Mucous membranes are moist. Posterior pharynx clear of any exudate or lesions.   Neck: normal, supple, no masses, no thyromegaly Respiratory: clear to auscultation bilaterally, no wheezing, no crackles. Normal respiratory effort.   Cardiovascular: S1 & S2 heard, regular rate and rhythm. No extremity edema.   Abdomen: No distension, soft, generalized tenderness without rebound pain or guarding. Bowel sounds appreciated.  Musculoskeletal: no clubbing / cyanosis. No joint deformity upper and lower extremities.   Skin: no significant rashes, lesions, ulcers. Warm, dry, well-perfused. Neurologic: No facial asymmetry. Dyskinesia involving tongue. Patellar DTRs normal. Strength 5/5 in all 4 limbs.  Psychiatric: Alert. Oriented to person, place, and situation.     Labs on Admission: I have personally reviewed following labs and imaging studies  CBC: Recent Labs  Lab 02/14/18 1217 02/14/18 1232 02/15/18 0542 02/16/18 0716 02/18/18 2225 02/18/18 2233  WBC 9.0  --  6.4 6.8 12.0*  --   NEUTROABS 7.2  --   --   --  10.2*  --   HGB 13.6 15.6* 12.0 11.6*  12.4 13.6  HCT 42.9 46.0 37.1 36.7 40.3 40.0  MCV 88.6  --  88.3 90.2 92.4  --   PLT 321  --  291 265 291  --    Basic Metabolic Panel: Recent Labs  Lab 02/14/18 1217  02/14/18 1232 02/15/18 0542 02/16/18 0716 02/18/18 2225 02/18/18 2233  NA 135 133* 138 134* 138 136  K 4.5 4.6 3.5 4.0 4.1 4.0  CL 96* 98 103 103 98 105  CO2 22  --  22 23 13*  --   GLUCOSE 474* 486* 181* 404* 522* 535*  BUN 17 20 13 18 19 20   CREATININE 1.48* 1.20* 1.18* 1.20* 1.63* 1.00  CALCIUM 10.3  --  9.4 8.9 10.0  --   MG  --   --   --  1.9  --   --    GFR: Estimated Creatinine Clearance: 57.3 mL/min (by C-G formula based on SCr of 1 mg/dL). Liver Function Tests: Recent Labs  Lab 02/14/18 1217 02/18/18 2225  AST 38 21  ALT 47* 28  ALKPHOS 264* 209*  BILITOT 1.7* 2.4*  PROT 9.0* 8.4*  ALBUMIN 3.9 3.8   Recent Labs  Lab 02/14/18 1217 02/18/18 2225  LIPASE 44 31   No results for input(s): AMMONIA in the last 168 hours. Coagulation Profile: No results for input(s): INR, PROTIME in the last 168 hours. Cardiac Enzymes: No results for input(s): CKTOTAL, CKMB, CKMBINDEX, TROPONINI in the last 168 hours. BNP (last 3 results) No results for input(s): PROBNP in the last 8760 hours. HbA1C: No results for input(s): HGBA1C in the last 72 hours. CBG: Recent Labs  Lab 02/16/18 0749 02/16/18 1230 02/18/18 2343 02/19/18 0056 02/19/18 0203  GLUCAP 382* 250* 477* 437* 392*   Lipid Profile: No results for input(s): CHOL, HDL, LDLCALC, TRIG, CHOLHDL, LDLDIRECT in the last 72 hours. Thyroid Function Tests: No results for input(s): TSH, T4TOTAL, FREET4, T3FREE, THYROIDAB in the last 72 hours. Anemia Panel: No results for input(s): VITAMINB12, FOLATE, FERRITIN, TIBC, IRON, RETICCTPCT in the last 72 hours. Urine analysis:    Component Value Date/Time   COLORURINE YELLOW 02/19/2018 0004   APPEARANCEUR HAZY (A) 02/19/2018 0004   LABSPEC 1.023 02/19/2018 0004   PHURINE 6.0 02/19/2018 0004   GLUCOSEU  >=500 (A) 02/19/2018 0004   HGBUR SMALL (A) 02/19/2018 0004   BILIRUBINUR NEGATIVE 02/19/2018 0004   BILIRUBINUR neg 01/17/2017 1533   KETONESUR 80 (A) 02/19/2018 0004   PROTEINUR 100 (A) 02/19/2018 0004   UROBILINOGEN 0.2 01/17/2017 1533   UROBILINOGEN 0.2 04/27/2015 1608   NITRITE NEGATIVE 02/19/2018 0004   LEUKOCYTESUR NEGATIVE 02/19/2018 0004   Sepsis Labs: @LABRCNTIP (procalcitonin:4,lacticidven:4) )No results found for this or any previous visit (from the past 240 hour(s)).   Radiological Exams on Admission: Dg Chest 2 View  Result Date: 02/17/2018 CLINICAL DATA:  Follow-up CABG.  Shortness of breath with exertion. EXAM: CHEST - 2 VIEW COMPARISON:  02/14/2018 FINDINGS: Previous median sternotomy and CABG. Heart size is normal. Mediastinal shadows are otherwise normal. The lungs are clear. The vascularity is normal. No pneumothorax or hemothorax. No significant bone finding. IMPRESSION: Previous CABG.  No active disease. Electronically Signed   By: Nelson Chimes M.D.   On: 02/17/2018 14:59   Ct Head Wo Contrast  Result Date: 02/19/2018 CLINICAL DATA:  52 year old female with altered mental status. EXAM: CT HEAD WITHOUT CONTRAST TECHNIQUE: Contiguous axial images were obtained from the base of the skull through the vertex without intravenous contrast. COMPARISON:  None. FINDINGS: Brain: The ventricles and sulci appropriate size for patient's age. The gray-white matter discrimination is preserved. No acute intracranial hemorrhage. No mass effect or midline shift. No extra-axial fluid collection. Vascular: No hyperdense vessel or unexpected calcification. Skull: Normal. Negative for fracture or focal lesion. Sinuses/Orbits:  No acute finding.  Right scleral banding. Other: None IMPRESSION: No acute intracranial pathology. Electronically Signed   By: Anner Crete M.D.   On: 02/19/2018 02:00   Dg Chest Port 1 View  Result Date: 02/18/2018 CLINICAL DATA:  Vomiting and difficulty speaking  EXAM: PORTABLE CHEST 1 VIEW COMPARISON:  Chest radiograph 02/17/2018 FINDINGS: The heart size and mediastinal contours are within normal limits. Both lungs are clear. The visualized skeletal structures are unremarkable. CABG sequelae. IMPRESSION: Clear lungs. Electronically Signed   By: Ulyses Jarred M.D.   On: 02/18/2018 22:51    EKG: Ordered and pending, in sinus rhythm on monitor.   Assessment/Plan   1. DKA; insulin-dependent DM  - Presents with confusion and hyperglycemia, found to be in recurrent DKA with normal head CT and she has since returned to baseline mental status  - She was fluid-resuscitated in ED with 2 liters IVF and started on insulin infusion  - Continue IVF hydration, continue insulin infusion with frequent CBG's and serial chem panels    2. Gastroparesis  - Hx of severe gastroparesis evaluated by GI at [place] as above, continue antiemetics, avoid narcotic analgesics    3. CAD s/p CABG July 2019  - No anginal complaints  - Underwent CABG last month, incision looks good  - Continue ASA, Lipitor, and Lopressor as tolerated    4. Anxiety  - Continue Buspar    5. Acute kidney injury  - SCr is 1.63 on admission, up from 1.20 on 02/16/18  - Likely prerenal and secondary to DKA with osmotic diuresis as well as gastroparesis with N/V and poor oral intake  - She was fluid-resuscitated in ED with 2 liters NS  - Renally-dose medications, avoid nephrotoxins, following serial chem panels as above     DVT prophylaxis: sq heparin  Code Status: Full  Family Communication: Discussed with patient  Consults called: none Admission status: Inpatient     Vianne Bulls, MD [facility] Pager [phone]  If 7PM-7AM, please contact night-coverage [email or web address] Password [facility]  02/19/2018, 2:32 AM

## 2018-02-19 NOTE — ED Notes (Signed)
Pt c/o abd pain, complaint communicated to MD Opyd

## 2018-02-19 NOTE — Progress Notes (Signed)
Patient is a 52 year old female with past medical history of insulin-dependent diabetes mellitus with gastroparesis, frequent DKA, coronary disease status post CABG, anxiety,medical noncompliance who presented to the emergency department for evaluation of confusion, hyperglycemia, abdominal pain, nausea and vomiting.  Patient was recently discharged from here after being treated for DKA gastroparesis.  Patient admitted for the management of recurrent DKA and gastroparesis. Patient seen and examined at the bedside in the emergency department.  She was mildly tachycardic but hemodynamically stable.  Complains of abdominal pain and has been having nausea.  We will continue Reglan along with other antiemetics.  We will continue IV fluids and insulin drip for DKA.  Continue to monitor her CBC.  Diabetic coordinator will be requested for consult. Patient seen by Dr. Myna Hidalgo this morning.

## 2018-02-19 NOTE — Progress Notes (Signed)
[facility] notified that patient had recent labs drawn. I asked what the plan is in regards to insulin gtt.

## 2018-02-19 NOTE — ED Notes (Signed)
Unable to stick nurse ANNIE RN IS Nolon Bussing.

## 2018-02-20 ENCOUNTER — Ambulatory Visit: Payer: Self-pay | Admitting: Cardiothoracic Surgery

## 2018-02-20 ENCOUNTER — Other Ambulatory Visit: Payer: Self-pay

## 2018-02-20 DIAGNOSIS — E0810 Diabetes mellitus due to underlying condition with ketoacidosis without coma: Secondary | ICD-10-CM

## 2018-02-20 LAB — GLUCOSE, CAPILLARY
GLUCOSE-CAPILLARY: 41 mg/dL — AB (ref 70–99)
GLUCOSE-CAPILLARY: 65 mg/dL — AB (ref 70–99)
GLUCOSE-CAPILLARY: 287 mg/dL — AB (ref 70–99)
GLUCOSE-CAPILLARY: 351 mg/dL — AB (ref 70–99)
Glucose-Capillary: 216 mg/dL — ABNORMAL HIGH (ref 70–99)
Glucose-Capillary: 287 mg/dL — ABNORMAL HIGH (ref 70–99)

## 2018-02-20 LAB — BASIC METABOLIC PANEL
Anion gap: 9 (ref 5–15)
BUN: 19 mg/dL (ref 6–20)
CHLORIDE: 111 mmol/L (ref 98–111)
CO2: 20 mmol/L — ABNORMAL LOW (ref 22–32)
CREATININE: 1.3 mg/dL — AB (ref 0.44–1.00)
Calcium: 9.4 mg/dL (ref 8.9–10.3)
GFR calc Af Amer: 54 mL/min — ABNORMAL LOW (ref >60)
GFR calc non Af Amer: 47 mL/min — ABNORMAL LOW (ref >60)
Glucose, Bld: 227 mg/dL — ABNORMAL HIGH (ref 70–99)
Potassium: 3.5 mmol/L (ref 3.5–5.1)
SODIUM: 140 mmol/L (ref 135–145)

## 2018-02-20 MED ORDER — LIP MEDEX EX OINT
TOPICAL_OINTMENT | CUTANEOUS | Status: DC | PRN
  Filled 2018-02-20: qty 7

## 2018-02-20 MED ORDER — LOPERAMIDE HCL 2 MG PO CAPS
2.0000 mg | ORAL_CAPSULE | ORAL | Status: DC | PRN
  Administered 2018-02-20: 2 mg via ORAL
  Filled 2018-02-20: qty 1

## 2018-02-20 MED FILL — ONDANSETRON ODT 8 MG TABLET: 8 | 10 days supply | Qty: 30 | Fill #0

## 2018-02-20 MED FILL — ?FUROSEMIDE 20 MG TABLET: 20 | 30 days supply | Qty: 30 | Fill #0

## 2018-02-20 MED FILL — ATORVASTATIN 80 MG TABLET: 80 | 30 days supply | Qty: 30 | Fill #0

## 2018-02-20 MED FILL — ?PANTOPRAZOLE SO DR 40MG TA: 40 | 30 days supply | Qty: 30 | Fill #1

## 2018-02-20 MED FILL — $LEVEMIR 100U/ML VIAL: 100 | 42 days supply | Qty: 10 | Fill #1

## 2018-02-20 MED FILL — busPIRone HCL 10 MG TABS: 10 | 30 days supply | Qty: 90 | Fill #1

## 2018-02-20 MED FILL — !NOVOLOG 100UNITS/ML VIAL: 100/ML | 28 days supply | Qty: 10 | Fill #0

## 2018-02-20 MED FILL — MIRTAZAPINE 15 MG TABS: 15 | 30 days supply | Qty: 30 | Fill #1

## 2018-02-20 MED FILL — PROMETHAZINE 12.5 MG TABLET: 12.5 | 8 days supply | Qty: 30 | Fill #0

## 2018-02-20 MED FILL — METOPROLOL TARTRATE 25 MG T: 25 | 30 days supply | Qty: 30 | Fill #0

## 2018-02-20 MED FILL — PYRIDOSTIGMINE BR 60 MG TAB: 60 | 30 days supply | Qty: 45 | Fill #1

## 2018-02-20 MED FILL — GABAPENTIN 300 MG CAPSULE: 300 | 30 days supply | Qty: 60 | Fill #0

## 2018-02-20 NOTE — Progress Notes (Signed)
CRITICAL VALUE ALERT  Critical Value:  CBG 41  Date & Time Notied:  02/20/2018 1237  Provider Notified: Tawanna Solo  Orders Received/Actions taken: Provided OJ, crackers and peanut butter. Will recheck CGB. Awaiting response from MD.

## 2018-02-20 NOTE — Progress Notes (Signed)
Pt's sister, Donato Schultz, requested a call from MD to provide an up date on plan of care. Phone # [phone]. Advised MD.

## 2018-02-20 NOTE — Progress Notes (Signed)
Paged MD at this time regarding Pt's request for Imodium. Awaiting new orders. Will continue to monitor Pt.

## 2018-02-20 NOTE — Progress Notes (Addendum)
PROGRESS NOTE    [name]  JGG:[id number] DOB: 26-May-1966 DOA: 02/18/2018 PCP: [name], MD   Brief Narrative: Patient is a 52 year old female with past medical history significant for insulin-dependent diabetic mellitus with gastroparesis, frequent DKA, coronary disease status post CABG recently, anxiety, medical noncompliance who presented to the emergency department for the evaluation of confusion, hypoglycemia and nausea/vomiting.  Patient was just discharged from here after being managed for DKA and gastroparesis.  Patient was found to have DKA on this presentation.  She was managed for DKA and gastroparesis.  Assessment & Plan:   Principal Problem:   DKA (diabetic ketoacidoses) ([place]) Active Problems:   Anxiety and depression   Diabetic gastroparesis ([facility])   AKI (acute kidney injury) ([place])   Noncompliance with treatment plan   CAD (coronary artery disease), native coronary artery  DKA: Started on insulin drip on admission.  Gap closed, so insulin drip discontinued and she was started on long-acting,short acting  insulin.  Diabetic coordinator was following.  She needs to continue her home regimen on discharge.  She has a follow-up with endocrinologist Dr. Dwyane Dee in November.  She needs to continue monitoring her blood sugars at home and should be adherent to her diabetic diet, insulin.Her hemoglobin A1c is 8.2.  Confusion: Resolved.  Currently alert and oriented x4.  Gastroparesis: Abdominal pain, nausea and vomiting much improved with Reglan.  We will advise her to eat small volume, frequent and low-fat diet.  She follows with [facility].  She needs  to have a good glycemic control for the management of her gastroparesis symptoms.  History of coronary artery disease: Status post CABG in July 2019.  Denies any chest pain.  Continue aspirin, Lipitor and Lopressor.  Anxiety: Continue BuSpar  Acute kidney injury: Presented with acute kidney injury most likely  secondary to dehydration.  Much improved with IV fluids.  Follow-up BMP in a week after discharge.     DVT prophylaxis: [place] Heparin Code Status: Full Family Communication: We will give a call to sister later today Disposition Plan: Home tomorrow   Consultants: None  Procedures: None  Antimicrobials: None  Subjective: Patient seen and examined the bedside this morning.  Feels much better.  Denies any nausea, vomiting or abdominal pain today.  Objective: Vitals:   02/19/18 2320 02/20/18 0432 02/20/18 0510 02/20/18 0800  BP: (!) 151/73 (!) 106/59 121/64 (!) 104/57  Pulse: 95 87 88 81  Resp: (!) 33 19 14 11   Temp: 98 F (36.7 C) 98.2 F (36.8 C)  98.5 F (36.9 C)  TempSrc: Oral Oral  Oral  SpO2: 100% 97% 97% 98%  Weight:      Height:        Intake/Output Summary (Last 24 hours) at 02/20/2018 1235 Last data filed at 02/20/2018 1100 Gross per 24 hour  Intake 1387.84 ml  Output 200 ml  Net 1187.84 ml   Filed Weights   02/19/18 0835  Weight: 59.3 kg    Examination:  General exam: Appears calm and comfortable ,Not in distress,average built HEENT:PERRL,Oral mucosa moist, Ear/Nose normal on gross exam Respiratory system: Bilateral equal air entry, normal vesicular breath sounds, no wheezes or crackles  Cardiovascular system: S1 & S2 heard, RRR. No JVD, murmurs, rubs, gallops or clicks. No pedal edema. Gastrointestinal system: Abdomen is nondistended, soft and nontender. No organomegaly or masses felt. Normal bowel sounds heard. Central nervous system: Alert and oriented. No focal neurological deficits. Extremities: No edema, no clubbing ,no cyanosis, distal peripheral pulses  palpable. Skin: No rashes, lesions or ulcers,no icterus ,no pallor MSK: Normal muscle bulk,tone ,power Psychiatry: Judgement and insight appear normal. Mood & affect appropriate.     Data Reviewed: I have personally reviewed following labs and imaging studies  CBC: Recent Labs  Lab  02/14/18 1217 02/14/18 1232 02/15/18 0542 02/16/18 0716 02/18/18 2225 02/18/18 2233  WBC 9.0  --  6.4 6.8 12.0*  --   NEUTROABS 7.2  --   --   --  10.2*  --   HGB 13.6 15.6* 12.0 11.6* 12.4 13.6  HCT 42.9 46.0 37.1 36.7 40.3 40.0  MCV 88.6  --  88.3 90.2 92.4  --   PLT 321  --  291 265 291  --    Basic Metabolic Panel: Recent Labs  Lab 02/16/18 0716  02/19/18 0351 02/19/18 0630 02/19/18 0830 02/19/18 1317 02/19/18 1926 02/20/18 0608  NA 134*   < > 141 136 144 143 143 140  K 4.0   < > 4.1 3.2* 4.0 4.2 3.5 3.5  CL 103   < > 110 107 111 112* 109 111  CO2 23   < > 13* 15* 19* 16* 21* 20*  GLUCOSE 404*   < > 269* 566* 162* 127* 163* 227*  BUN 18   < > 18 16 19 18 16 19   CREATININE 1.20*   < > 1.55* 1.40* 1.47* 1.25* 1.25* 1.30*  CALCIUM 8.9   < > 9.5 8.5* 10.0 9.7 10.2 9.4  MG 1.9  --  1.8  --   --   --   --   --    < > = values in this interval not displayed.   GFR: Estimated Creatinine Clearance: 42.8 mL/min (A) (by C-G formula based on SCr of 1.3 mg/dL (H)). Liver Function Tests: Recent Labs  Lab 02/14/18 1217 02/18/18 2225  AST 38 21  ALT 47* 28  ALKPHOS 264* 209*  BILITOT 1.7* 2.4*  PROT 9.0* 8.4*  ALBUMIN 3.9 3.8   Recent Labs  Lab 02/14/18 1217 02/18/18 2225  LIPASE 44 31   No results for input(s): AMMONIA in the last 168 hours. Coagulation Profile: No results for input(s): INR, PROTIME in the last 168 hours. Cardiac Enzymes: No results for input(s): CKTOTAL, CKMB, CKMBINDEX, TROPONINI in the last 168 hours. BNP (last 3 results) No results for input(s): PROBNP in the last 8760 hours. HbA1C: Recent Labs    02/19/18 0800  HGBA1C 8.2*   CBG: Recent Labs  Lab 02/19/18 2101 02/19/18 2139 02/19/18 2216 02/20/18 0749 02/20/18 1213  GLUCAP 193* 178* 215* 216* 41*   Lipid Profile: No results for input(s): CHOL, HDL, LDLCALC, TRIG, CHOLHDL, LDLDIRECT in the last 72 hours. Thyroid Function Tests: No results for input(s): TSH, T4TOTAL, FREET4,  T3FREE, THYROIDAB in the last 72 hours. Anemia Panel: No results for input(s): VITAMINB12, FOLATE, FERRITIN, TIBC, IRON, RETICCTPCT in the last 72 hours. Sepsis Labs: No results for input(s): PROCALCITON, LATICACIDVEN in the last 168 hours.  Recent Results (from the past 240 hour(s))  MRSA PCR Screening     Status: None   Collection Time: 02/19/18  8:07 AM  Result Value Ref Range Status   MRSA by PCR NEGATIVE NEGATIVE Final    Comment:        The GeneXpert MRSA Assay (FDA approved for NASAL specimens only), is one component of a comprehensive MRSA colonization surveillance program. It is not intended to diagnose MRSA infection nor to guide or monitor treatment for MRSA infections.  Performed at [facility], [place] [address], [place], [place] [address]          Radiology Studies: Ct Head Wo Contrast  Result Date: 02/19/2018 CLINICAL DATA:  52 year old female with altered mental status. EXAM: CT HEAD WITHOUT CONTRAST TECHNIQUE: Contiguous axial images were obtained from the base of the skull through the vertex without intravenous contrast. COMPARISON:  None. FINDINGS: Brain: The ventricles and sulci appropriate size for patient's age. The gray-white matter discrimination is preserved. No acute intracranial hemorrhage. No mass effect or midline shift. No extra-axial fluid collection. Vascular: No hyperdense vessel or unexpected calcification. Skull: Normal. Negative for fracture or focal lesion. Sinuses/Orbits: No acute finding.  Right scleral banding. Other: None IMPRESSION: No acute intracranial pathology. Electronically Signed   By: Anner Crete M.D.   On: 02/19/2018 02:00   Dg Chest Port 1 View  Result Date: 02/18/2018 CLINICAL DATA:  Vomiting and difficulty speaking EXAM: PORTABLE CHEST 1 VIEW COMPARISON:  Chest radiograph 02/17/2018 FINDINGS: The heart size and mediastinal contours are within normal limits. Both lungs are clear. The visualized skeletal structures are  unremarkable. CABG sequelae. IMPRESSION: Clear lungs. Electronically Signed   By: Ulyses Jarred M.D.   On: 02/18/2018 22:51        Scheduled Meds: . aspirin  325 mg Oral Daily  . atorvastatin  80 mg Oral q1800  . busPIRone  10 mg Oral TID  . gabapentin  600 mg Oral BID  . heparin  5,000 Units Subcutaneous Q8H  . insulin aspart  0-5 Units Subcutaneous QHS  . insulin aspart  0-9 Units Subcutaneous TID WC  . insulin aspart  3 Units Subcutaneous TID WC  . insulin detemir  13 Units Subcutaneous QHS  . metoCLOPramide (REGLAN) injection  10 mg Intravenous Q6H  . metoprolol tartrate  12.5 mg Oral BID   Continuous Infusions: . sodium chloride 50 mL/hr at 02/19/18 2150     LOS: 1 day    Time spent: 25 mins.More than 50% of that time was spent in counseling and/or coordination of care.      Shelly Coss, MD [facility] Pager [phone]  If 7PM-7AM, please contact night-coverage [email or web address] Password [facility] 02/20/2018, 12:35 PM

## 2018-02-21 DIAGNOSIS — F329 Major depressive disorder, single episode, unspecified: Secondary | ICD-10-CM

## 2018-02-21 DIAGNOSIS — F419 Anxiety disorder, unspecified: Secondary | ICD-10-CM

## 2018-02-21 DIAGNOSIS — N179 Acute kidney failure, unspecified: Secondary | ICD-10-CM

## 2018-02-21 LAB — GLUCOSE, CAPILLARY
GLUCOSE-CAPILLARY: 186 mg/dL — AB (ref 70–99)
GLUCOSE-CAPILLARY: 110 mg/dL — AB (ref 70–99)
Glucose-Capillary: 194 mg/dL — ABNORMAL HIGH (ref 70–99)

## 2018-02-21 MED ORDER — BUSPIRONE HCL 10 MG PO TABS: 10.0000 mg | ORAL_TABLET | Freq: Three times a day (TID) | ORAL | 0 refills | Status: DC

## 2018-02-21 MED ORDER — SERTRALINE HCL 25 MG PO TABS
25.0000 mg | ORAL_TABLET | Freq: Every day | ORAL | Status: DC
  Administered 2018-02-21: 25 mg via ORAL

## 2018-02-21 MED ORDER — SERTRALINE HCL 25 MG PO TABS: 25.0000 mg | ORAL_TABLET | Freq: Every day | ORAL | 0 refills | Status: DC

## 2018-02-21 NOTE — Care Management Note (Signed)
Case Management Note  Patient Details  Name: [name] MRN: [id number] Date of Birth: [birth year]  Subjective/Objective:                 Multiple readmissions for DKA 2/2 gastroparesis   Action/Plan:  Spoke w patient extensively. Identified that her readmission pattern may be attributed to her poor self management of her nausea from the gastroparesis. Discussed home health and she states that she had had [facility] in the past, confirmed that she had PT OT. Discussed how a [facility] RN could assist her in the early stages of nausea management with her blood sugars and medications to avoid falling into DKA and needing hospital management. She is in agreement for [facility] RN and understands that RN is always available by phone and should be called as soon she feels she needs to start taking her zofran at home. Referral accepted by Dian Situ, clinical liaison [facility].  She states that her sister is picking up 12 medications from [place] today for her prior to them closing for the weekend.  Attempted to reach transitions of care coordinator Eden Lathe at [facility], unable to reach her or leave a voicemail so message was sent via Epic to assist with extending [place] and for any assistance they could offer to assist w preventing readmission and helping patient manage successfully at home.   No further CM needs at this time  Expected Discharge Date:  02/21/18               Expected Discharge Plan:  [place]  In-House Referral:     Discharge planning Services  CM Consult  Post Acute Care Choice:  Home Health Choice offered to:  Patient  DME Arranged:    DME Agency:     HH Arranged:  RN [place] Agency:  [place]  Status of Service:  Completed, signed off  If discussed at [place] of Stay Meetings, dates discussed:    Additional Comments:  Carles Collet, RN 02/21/2018, 11:22 AM

## 2018-02-21 NOTE — Discharge Summary (Signed)
Physician Discharge Summary  [name] IRJ:[id number] DOB: 1966/02/18 DOA: 02/18/2018  PCP: [name], MD  Admit date: 02/18/2018 Discharge date: 02/21/2018  Admitted From: Home Disposition:  Home  Discharge Condition:Stable CODE STATUS:FULL Diet recommendation: Heart Healthy  Brief/Interim Summary:  Patient is a 52 year old female with past medical history significant for insulin-dependent diabetic mellitus with gastroparesis, frequent DKA, coronary disease status post CABG recently, anxiety, medical noncompliance who presented to the emergency department for the evaluation of confusion, hypoglycemia and nausea/vomiting.  Patient was just discharged from here after being managed for DKA and gastroparesis.  Patient was found to have DKA on this presentation.  She was managed for DKA and gastroparesis.  Her gap has closed she is back to her insulin regimen from home.  Patient is stable for discharge to home today.  She will follow-up with endocrinology as an outpatient soon.  Following problems were addressed during her hospitalization:  DKA: Started on insulin drip on admission.  Gap closed, so insulin drip discontinued and she was started on long-acting,short acting  insulin.  Diabetic coordinator was following.  She needs to continue her home regimen on discharge.  She has a follow-up with endocrinologist Dr. Dwyane Dee in November.  She needs to continue monitoring her blood sugars at home and should be adherent to her diabetic diet, insulin.Her hemoglobin A1c is 8.2.  Confusion: Resolved.  Currently alert and oriented x4.  Gastroparesis: Abdominal pain, nausea and vomiting much improved with Reglan.  We will advise her to eat small volume, frequent and low-fat diet.  She follows with [facility].  She needs  to have a good glycemic control for the management of her gastroparesis symptoms.  Continue Reglan as needed at home.  History of coronary artery disease: Status post CABG  in July 2019.  Denies any chest pain.  Continue aspirin, Lipitor and Lopressor.  Follow-up with cardiology as an outpatient.  Anxiety/depression: Continue BuSpar.  Started on Zoloft.  Follow-up with psychiatry as an outpatient.  Acute kidney injury: Presented with acute kidney injury most likely secondary to dehydration.  Much improved with IV fluids.  Follow-up BMP in a week after discharge.    Discharge Diagnoses:  Principal Problem:   DKA (diabetic ketoacidoses) ([place]) Active Problems:   Anxiety and depression   Diabetic gastroparesis ([facility])   AKI (acute kidney injury) ([place])   Noncompliance with treatment plan   CAD (coronary artery disease), native coronary artery    Discharge Instructions  Discharge Instructions    Ambulatory referral to Psychiatry   Complete by:  As directed    Diet - low sodium heart healthy   Complete by:  As directed    Discharge instructions   Complete by:  As directed    1) Please follow up with your PCP in a week.  Do CBC, BMP test during the follow-up. 2) Follow up with psychiatry as an outpatient   Name and number the provider has been attached.  Please call for appointment. 3)Follow up with cardiology as an outpatient. 4)Take prescribed medications as instructed. 5)Please continue to monitor your blood sugars at home.  Continue taking her insulin. Follow-up with endocrinologist as an outpatient in your upcoming appointment.   Increase activity slowly   Complete by:  As directed      Allergies as of 02/21/2018      Reactions   Anesthetics, Amide Nausea And Vomiting   Penicillins Diarrhea, Nausea And Vomiting, Other (See Comments)   Has patient had a PCN reaction  causing immediate rash, facial/tongue/throat swelling, SOB or lightheadedness with hypotension: Yes Has patient had a PCN reaction causing severe rash involving mucus membranes or skin necrosis: No Has patient had a PCN reaction that required hospitalization No Has patient had a PCN  reaction occurring within the last 10 years: Yes  If all of the above answers are "NO", then may proceed with Cephalosporin use.   Buprenorphine Hcl Rash   Encainide Nausea And Vomiting   Metoclopramide Other (See Comments)   Dystonia, muscle rigidity.  Patient states she is only allergic to the "pill form, not the IV"      Medication List    TAKE these medications   acetaminophen 325 MG tablet Commonly known as:  TYLENOL Take 2 tablets (650 mg total) by mouth every 6 (six) hours as needed for mild pain.   aspirin 325 MG EC tablet Take 1 tablet (325 mg total) by mouth daily.   atorvastatin 80 MG tablet Commonly known as:  LIPITOR Take 1 tablet (80 mg total) by mouth daily at 6 PM.   busPIRone 10 MG tablet Commonly known as:  BUSPAR Take 1 tablet (10 mg total) by mouth 3 (three) times daily.   diphenhydrAMINE 25 mg capsule Commonly known as:  BENADRYL Take 25 mg by mouth every 6 (six) hours as needed for allergies.   docusate sodium 100 MG capsule Commonly known as:  COLACE Take 1 capsule (100 mg total) by mouth daily as needed for mild constipation.   fluticasone 50 MCG/ACT nasal spray Commonly known as:  FLONASE Place 2 sprays into both nostrils as needed for rhinitis.   furosemide 20 MG tablet Commonly known as:  LASIX Take 1 tablet (20 mg total) by mouth daily.   gabapentin 600 MG tablet Commonly known as:  NEURONTIN Take 600 mg by mouth 2 (two) times daily.   guaiFENesin 600 MG 12 hr tablet Commonly known as:  MUCINEX Take 600 mg by mouth 2 (two) times daily.   insulin aspart 100 UNIT/ML injection Commonly known as:  novoLOG Inject 0-12 Units into the skin 4 (four) times daily -  before meals and at bedtime. Uses a sliding scale: 70-119 = 0 units, 120-150 = 2 units, 151-180 =  4 units, 181-210 = 5 units, 211-240 = 6 units, 241-270 = 8 units, 271-300 = 9 units, 301-330 = 10 units, 331-360 = 11 units, 361-400 = 12 units, greater than 400 notify MD.   insulin  detemir 100 UNIT/ML injection Commonly known as:  LEVEMIR Inject 0.15 mLs (15 Units total) into the skin every morning.   metoCLOPramide 10 MG tablet Commonly known as:  REGLAN Take 1 tablet (10 mg total) by mouth every 6 (six) hours as needed for nausea (nausea/headache).   metoprolol tartrate 25 MG tablet Commonly known as:  LOPRESSOR Take 0.5 tablets (12.5 mg total) by mouth 2 (two) times daily.   pantoprazole 40 MG tablet Commonly known as:  PROTONIX Take 1 tablet (40 mg total) by mouth daily.   potassium chloride 10 MEQ tablet Commonly known as:  K-DUR,KLOR-CON Take 1 tablet (10 mEq total) by mouth daily.   promethazine 12.5 MG tablet Commonly known as:  PHENERGAN Take 1 tablet (12.5 mg total) by mouth every 6 (six) hours as needed for nausea or vomiting.   pyridostigmine 60 MG tablet Commonly known as:  MESTINON Take 0.5 tablets (30 mg total) by mouth 3 (three) times daily. Take 1/2 with meals   sertraline 25 MG tablet Commonly known as:  ZOLOFT Take 1 tablet (25 mg total) by mouth daily.      Follow-up Information    Eksir, Richard Miu, MD. Schedule an appointment as soon as possible for a visit in 1 week(s).   Specialty:  Psychiatry Contact information: [place] [address] [phone]        [name], MD. Schedule an appointment as soon as possible for a visit in 1 week(s).   Specialty:  Family Medicine Contact information: [place] [address] [phone]        Nahser, Wonda Cheng, MD .   Specialty:  Cardiology Contact information: [place] [address] [place] [address] [phone]          Allergies  Allergen Reactions  . Anesthetics, Amide Nausea And Vomiting  . Penicillins Diarrhea, Nausea And Vomiting and Other (See Comments)    Has patient had a PCN reaction causing immediate rash, facial/tongue/throat swelling, SOB or lightheadedness with hypotension: Yes Has patient  had a PCN reaction causing severe rash involving mucus membranes or skin necrosis: No Has patient had a PCN reaction that required hospitalization No Has patient had a PCN reaction occurring within the last 10 years: Yes  If all of the above answers are "NO", then may proceed with Cephalosporin use.   . Buprenorphine Hcl Rash  . Encainide Nausea And Vomiting  . Metoclopramide Other (See Comments)    Dystonia, muscle rigidity.  Patient states she is only allergic to the "pill form, not the IV"    Consultations:  None   Procedures/Studies: Dg Chest 2 View  Result Date: 02/17/2018 CLINICAL DATA:  Follow-up CABG.  Shortness of breath with exertion. EXAM: CHEST - 2 VIEW COMPARISON:  02/14/2018 FINDINGS: Previous median sternotomy and CABG. Heart size is normal. Mediastinal shadows are otherwise normal. The lungs are clear. The vascularity is normal. No pneumothorax or hemothorax. No significant bone finding. IMPRESSION: Previous CABG.  No active disease. Electronically Signed   By: Nelson Chimes M.D.   On: 02/17/2018 14:59   Dg Chest 2 View  Result Date: 01/28/2018 CLINICAL DATA:  Nausea, vomiting and diaphoresis. CABG approximately 2 weeks ago. EXAM: CHEST - 2 VIEW COMPARISON:  01/24/2018 FINDINGS: The patient is status post CABG with mild cardiomegaly, stable in appearance. Hazy opacities are noted at the lung bases left greater than right compatible with atelectasis and/or pneumonia. Posterior small pleural effusions are identified. Improved aeration at the right lung base since prior. No acute osseous abnormality. PICC line has been removed in the interim. IMPRESSION: Persistent left basilar opacity suspicious for pneumonia and/or atelectasis. Posterior small pleural effusions are present as before. Improved aeration of the right lung base since the interim. Electronically Signed   By: Ashley Royalty M.D.   On: 01/28/2018 18:48   Ct Head Wo Contrast  Result Date: 02/19/2018 CLINICAL DATA:   52 year old female with altered mental status. EXAM: CT HEAD WITHOUT CONTRAST TECHNIQUE: Contiguous axial images were obtained from the base of the skull through the vertex without intravenous contrast. COMPARISON:  None. FINDINGS: Brain: The ventricles and sulci appropriate size for patient's age. The gray-white matter discrimination is preserved. No acute intracranial hemorrhage. No mass effect or midline shift. No extra-axial fluid collection. Vascular: No hyperdense vessel or unexpected calcification. Skull: Normal. Negative for fracture or focal lesion. Sinuses/Orbits: No acute finding.  Right scleral banding. Other: None IMPRESSION: No acute intracranial pathology. Electronically Signed   By: Anner Crete M.D.   On:  02/19/2018 02:00   Dg Chest Port 1 View  Result Date: 02/18/2018 CLINICAL DATA:  Vomiting and difficulty speaking EXAM: PORTABLE CHEST 1 VIEW COMPARISON:  Chest radiograph 02/17/2018 FINDINGS: The heart size and mediastinal contours are within normal limits. Both lungs are clear. The visualized skeletal structures are unremarkable. CABG sequelae. IMPRESSION: Clear lungs. Electronically Signed   By: Ulyses Jarred M.D.   On: 02/18/2018 22:51   Dg Chest Port 1 View  Result Date: 02/14/2018 CLINICAL DATA:  Chest pain and vomiting. EXAM: PORTABLE CHEST 1 VIEW COMPARISON:  01/28/2018 FINDINGS: There is no focal parenchymal opacity. There is no pleural effusion or pneumothorax. The heart and mediastinal contours are unremarkable. There is evidence of prior CABG. The osseous structures are unremarkable. IMPRESSION: No active disease. Electronically Signed   By: Kathreen Devoid   On: 02/14/2018 12:12   Dg Chest Port 1 View  Result Date: 01/24/2018 CLINICAL DATA:  Shortness of breath. EXAM: PORTABLE CHEST 1 VIEW COMPARISON:  01/21/2018.  01/18/2018. FINDINGS: Left PICC line stable position. Prior CABG. Stable cardiomegaly. Bilateral basilar pulmonary infiltrates/edema and bilateral pleural  effusions. No pneumothorax. IMPRESSION: 1.  Left PICC line stable position. 2. Prior CABG. Cardiomegaly with bibasilar infiltrates/edema bilateral pleural effusions. Findings suggest CHF. These findings have progressed from prior exam. Electronically Signed   By: [facility]   On: 01/24/2018 08:30       Subjective: Patient seen and examined the bedside this morning.  Remains comfortable.  Stable for discharge to home today.  Discharge Exam: Vitals:   02/21/18 0428 02/21/18 0710  BP:  140/73  Pulse:  80  Resp:  17  Temp: 97.7 F (36.5 C) 98.1 F (36.7 C)  SpO2:  100%   Vitals:   02/21/18 0037 02/21/18 0310 02/21/18 0428 02/21/18 0710  BP: (!) 100/52 107/63  140/73  Pulse: 70 70  80  Resp: 15 17  17   Temp: (!) 97.5 F (36.4 C) 98 F (36.7 C) 97.7 F (36.5 C) 98.1 F (36.7 C)  TempSrc: Oral Oral  Oral  SpO2: 100% 100%  100%  Weight:      Height:        General: Pt is alert, awake, not in acute distress Cardiovascular: RRR, S1/S2 +, no rubs, no gallops Respiratory: CTA bilaterally, no wheezing, no rhonchi Abdominal: Soft, NT, ND, bowel sounds + Extremities: no edema, no cyanosis    The results of significant diagnostics from this hospitalization (including imaging, microbiology, ancillary and laboratory) are listed below for reference.     Microbiology: Recent Results (from the past 240 hour(s))  MRSA PCR Screening     Status: None   Collection Time: 02/19/18  8:07 AM  Result Value Ref Range Status   MRSA by PCR NEGATIVE NEGATIVE Final    Comment:        The GeneXpert MRSA Assay (FDA approved for NASAL specimens only), is one component of a comprehensive MRSA colonization surveillance program. It is not intended to diagnose MRSA infection nor to guide or monitor treatment for MRSA infections. Performed at [facility], [place] [address] [place], [place] [address]      Labs: BNP (last 3 results) No results for input(s): BNP in the last 8760  hours. Basic Metabolic Panel: Recent Labs  Lab 02/16/18 0716  02/19/18 0351 02/19/18 0630 02/19/18 0830 02/19/18 1317 02/19/18 1926 02/20/18 0608  NA 134*   < > 141 136 144 143 143 140  K 4.0   < > 4.1  3.2* 4.0 4.2 3.5 3.5  CL 103   < > 110 107 111 112* 109 111  CO2 23   < > 13* 15* 19* 16* 21* 20*  GLUCOSE 404*   < > 269* 566* 162* 127* 163* 227*  BUN 18   < > 18 16 19 18 16 19   CREATININE 1.20*   < > 1.55* 1.40* 1.47* 1.25* 1.25* 1.30*  CALCIUM 8.9   < > 9.5 8.5* 10.0 9.7 10.2 9.4  MG 1.9  --  1.8  --   --   --   --   --    < > = values in this interval not displayed.   Liver Function Tests: Recent Labs  Lab 02/14/18 1217 02/18/18 2225  AST 38 21  ALT 47* 28  ALKPHOS 264* 209*  BILITOT 1.7* 2.4*  PROT 9.0* 8.4*  ALBUMIN 3.9 3.8   Recent Labs  Lab 02/14/18 1217 02/18/18 2225  LIPASE 44 31   No results for input(s): AMMONIA in the last 168 hours. CBC: Recent Labs  Lab 02/14/18 1217 02/14/18 1232 02/15/18 0542 02/16/18 0716 02/18/18 2225 02/18/18 2233  WBC 9.0  --  6.4 6.8 12.0*  --   NEUTROABS 7.2  --   --   --  10.2*  --   HGB 13.6 15.6* 12.0 11.6* 12.4 13.6  HCT 42.9 46.0 37.1 36.7 40.3 40.0  MCV 88.6  --  88.3 90.2 92.4  --   PLT 321  --  291 265 291  --    Cardiac Enzymes: No results for input(s): CKTOTAL, CKMB, CKMBINDEX, TROPONINI in the last 168 hours. BNP: Invalid input(s): POCBNP CBG: Recent Labs  Lab 02/20/18 1618 02/20/18 1652 02/20/18 2313 02/21/18 0541 02/21/18 0802  GLUCAP 287* 351* 287* 194* 186*   D-Dimer No results for input(s): DDIMER in the last 72 hours. Hgb A1c Recent Labs    02/19/18 0800  HGBA1C 8.2*   Lipid Profile No results for input(s): CHOL, HDL, LDLCALC, TRIG, CHOLHDL, LDLDIRECT in the last 72 hours. Thyroid function studies No results for input(s): TSH, T4TOTAL, T3FREE, THYROIDAB in the last 72 hours.  Invalid input(s): FREET3 Anemia work up No results for input(s): VITAMINB12, FOLATE, FERRITIN, TIBC,  IRON, RETICCTPCT in the last 72 hours. Urinalysis    Component Value Date/Time   COLORURINE YELLOW 02/19/2018 0004   APPEARANCEUR HAZY (A) 02/19/2018 0004   LABSPEC 1.023 02/19/2018 0004   PHURINE 6.0 02/19/2018 0004   GLUCOSEU >=500 (A) 02/19/2018 0004   HGBUR SMALL (A) 02/19/2018 0004   BILIRUBINUR NEGATIVE 02/19/2018 0004   BILIRUBINUR neg 01/17/2017 1533   KETONESUR 80 (A) 02/19/2018 0004   PROTEINUR 100 (A) 02/19/2018 0004   UROBILINOGEN 0.2 01/17/2017 1533   UROBILINOGEN 0.2 04/27/2015 1608   NITRITE NEGATIVE 02/19/2018 0004   LEUKOCYTESUR NEGATIVE 02/19/2018 0004   Sepsis Labs Invalid input(s): PROCALCITONIN,  WBC,  LACTICIDVEN Microbiology Recent Results (from the past 240 hour(s))  MRSA PCR Screening     Status: None   Collection Time: 02/19/18  8:07 AM  Result Value Ref Range Status   MRSA by PCR NEGATIVE NEGATIVE Final    Comment:        The GeneXpert MRSA Assay (FDA approved for NASAL specimens only), is one component of a comprehensive MRSA colonization surveillance program. It is not intended to diagnose MRSA infection nor to guide or monitor treatment for MRSA infections. Performed at [facility], [place] [address], [place], [place] [address]  Please note: You were cared for by a hospitalist during your hospital stay. Once you are discharged, your primary care physician will handle any further medical issues. Please note that NO REFILLS for any discharge medications will be authorized once you are discharged, as it is imperative that you return to your primary care physician (or establish a relationship with a primary care physician if you do not have one) for your post hospital discharge needs so that they can reassess your need for medications and monitor your lab values.    Time coordinating discharge: 40 minutes  SIGNED:   Shelly Coss, MD  [facility] 02/21/2018, 11:07 AM Pager [phone]  If 7PM-7AM, please contact  night-coverage [email or web address] Password [email or web address]

## 2018-02-23 ENCOUNTER — Encounter (HOSPITAL_COMMUNITY): Payer: Self-pay | Admitting: Emergency Medicine

## 2018-02-23 ENCOUNTER — Other Ambulatory Visit: Payer: Self-pay

## 2018-02-23 ENCOUNTER — Emergency Department (HOSPITAL_COMMUNITY)
Admission: EM | Admit: 2018-02-23 | Discharge: 2018-02-23 | Disposition: A | Discharge status: Home / Self Care | Payer: Medicare Other | Attending: Emergency Medicine | Admitting: Emergency Medicine

## 2018-02-23 DIAGNOSIS — F419 Anxiety disorder, unspecified: Secondary | ICD-10-CM | POA: Diagnosis not present

## 2018-02-23 DIAGNOSIS — Z7982 Long term (current) use of aspirin: Secondary | ICD-10-CM | POA: Insufficient documentation

## 2018-02-23 DIAGNOSIS — Z794 Long term (current) use of insulin: Secondary | ICD-10-CM | POA: Insufficient documentation

## 2018-02-23 DIAGNOSIS — I252 Old myocardial infarction: Secondary | ICD-10-CM | POA: Diagnosis not present

## 2018-02-23 DIAGNOSIS — Z951 Presence of aortocoronary bypass graft: Secondary | ICD-10-CM | POA: Diagnosis not present

## 2018-02-23 DIAGNOSIS — E119 Type 2 diabetes mellitus without complications: Secondary | ICD-10-CM | POA: Diagnosis not present

## 2018-02-23 DIAGNOSIS — I2510 Atherosclerotic heart disease of native coronary artery without angina pectoris: Secondary | ICD-10-CM | POA: Diagnosis not present

## 2018-02-23 DIAGNOSIS — R109 Unspecified abdominal pain: Secondary | ICD-10-CM | POA: Diagnosis present

## 2018-02-23 DIAGNOSIS — Z79899 Other long term (current) drug therapy: Secondary | ICD-10-CM | POA: Diagnosis not present

## 2018-02-23 DIAGNOSIS — F329 Major depressive disorder, single episode, unspecified: Secondary | ICD-10-CM | POA: Insufficient documentation

## 2018-02-23 DIAGNOSIS — I10 Essential (primary) hypertension: Secondary | ICD-10-CM | POA: Insufficient documentation

## 2018-02-23 DIAGNOSIS — R112 Nausea with vomiting, unspecified: Secondary | ICD-10-CM

## 2018-02-23 LAB — CBC
HCT: 36 % (ref 36.0–46.0)
Hemoglobin: 12.2 g/dL (ref 12.0–15.0)
MCH: 29 pg (ref 26.0–34.0)
MCHC: 33.9 g/dL (ref 30.0–36.0)
MCV: 85.5 fL (ref 78.0–100.0)
PLATELETS: 308 10*3/uL (ref 150–400)
RBC: 4.21 MIL/uL (ref 3.87–5.11)
RDW: 14.2 % (ref 11.5–15.5)
WBC: 5.9 10*3/uL (ref 4.0–10.5)

## 2018-02-23 LAB — BLOOD GAS, VENOUS
ACID-BASE EXCESS: 0.5 mmol/L (ref 0.0–2.0)
BICARBONATE: 24.9 mmol/L (ref 20.0–28.0)
O2 SAT: 76.6 %
Patient temperature: 98.6
pCO2, Ven: 41.7 mmHg — ABNORMAL LOW (ref 44.0–60.0)
pH, Ven: 7.394 (ref 7.250–7.430)
pO2, Ven: 44.1 mmHg (ref 32.0–45.0)

## 2018-02-23 LAB — COMPREHENSIVE METABOLIC PANEL
ALBUMIN: 3.5 g/dL (ref 3.5–5.0)
ALK PHOS: 152 U/L — AB (ref 38–126)
ALT: 19 U/L (ref 0–44)
AST: 17 U/L (ref 15–41)
Anion gap: 11 (ref 5–15)
BUN: 12 mg/dL (ref 6–20)
CALCIUM: 9.3 mg/dL (ref 8.9–10.3)
CO2: 26 mmol/L (ref 22–32)
CREATININE: 0.88 mg/dL (ref 0.44–1.00)
Chloride: 105 mmol/L (ref 98–111)
GFR calc Af Amer: >60 mL/min (ref >60)
GFR calc non Af Amer: >60 mL/min (ref >60)
GLUCOSE: 244 mg/dL — AB (ref 70–99)
Potassium: 3.9 mmol/L (ref 3.5–5.1)
SODIUM: 142 mmol/L (ref 135–145)
Total Bilirubin: 1.7 mg/dL — ABNORMAL HIGH (ref 0.3–1.2)
Total Protein: 7.5 g/dL (ref 6.5–8.1)

## 2018-02-23 LAB — I-STAT BETA HCG BLOOD, ED (MC, WL, AP ONLY): I-stat hCG, quantitative: <5 m[IU]/mL (ref <5)

## 2018-02-23 LAB — URINALYSIS, ROUTINE W REFLEX MICROSCOPIC
BILIRUBIN URINE: NEGATIVE
Bacteria, UA: NONE SEEN
Glucose, UA: >=500 mg/dL — AB
Hgb urine dipstick: NEGATIVE
Ketones, ur: 20 mg/dL — AB
Leukocytes, UA: NEGATIVE
Nitrite: NEGATIVE
PH: 6 (ref 5.0–8.0)
Protein, ur: 100 mg/dL — AB
SPECIFIC GRAVITY, URINE: 1.012 (ref 1.005–1.030)

## 2018-02-23 LAB — CBG MONITORING, ED: Glucose-Capillary: 209 mg/dL — ABNORMAL HIGH (ref 70–99)

## 2018-02-23 LAB — LIPASE, BLOOD: Lipase: 26 U/L (ref 11–51)

## 2018-02-23 MED ORDER — PROMETHAZINE HCL 25 MG/ML IJ SOLN
25.0000 mg | Freq: Once | INTRAMUSCULAR | Status: AC
  Administered 2018-02-23: 25 mg via INTRAMUSCULAR
  Filled 2018-02-23: qty 1

## 2018-02-23 MED ORDER — ONDANSETRON HCL 4 MG PO TABS: 4.0000 mg | ORAL_TABLET | Freq: Four times a day (QID) | ORAL | 0 refills | Status: AC

## 2018-02-23 NOTE — ED Triage Notes (Signed)
Pt c/o abdominal pain and emesis. Pt recently admitted for DKA and d/c 02/21/2018 and feeling well that day but started with vomiting yesterday. Pt states bs in the 200's at approximately 10am.

## 2018-02-23 NOTE — Progress Notes (Signed)
IV Team Note;  Called to place iv for pt;  Pt very well known to IV Team;  Pt with extremely poor peripheral access;  Both arms assessed thoroughly w ultrasound;  Veins are very close to arteries, or do not compress;  RN aware;  Raynelle Fanning  RN

## 2018-02-23 NOTE — ED Provider Notes (Signed)
Medical screening examination/treatment/procedure(s) were conducted as a shared visit with non-physician practitioner(s) and myself.  I personally evaluated the patient during the encounter.  Pt presents with recurrent vomiting and abdominal pain.  Pt has a history of recurrent issues with DKA and gastroparesis.  IV team unable to get access.  I attempted a left IJ unsuccessfully.  Blood drawn from right femoral with US guidance successfully.  Will treat symptomatically, IM meds, check labs for DKA.   Dorie Rank, MD 02/23/18 [phone]

## 2018-02-23 NOTE — ED Notes (Signed)
ED Provider at bedside. 

## 2018-02-23 NOTE — ED Notes (Signed)
RN attempted to d/c pt. Pt states she is trying to get in contact with her niece to take her home

## 2018-02-23 NOTE — ED Provider Notes (Signed)
[facility] DEPT Provider Note  CSN: [id number] Arrival date & time: 02/23/18  1204  History   Chief Complaint Chief Complaint  Patient presents with  . Emesis  . Abdominal Pain   HPI [name] is a 52 y.o. female with a medical history of NSTEMI s/p CABG x3, Type 1 DM, HTN, gastroparesis and GERD who presented to the ED for abdominal pain and nausea/vomiting x1 day. She reports vomiting which began yesterday and states it feels like her gastroparesis is acting up. Patient states that she was not having any vomiting when she was discharged from the hospital 2 days ago. Patient has tried Zofran prior to coming to the ED, but states she has not been able to keep it down. Denies fever, chills, changes in bowel habits, blood in stool, hematuria, dysuriam urgency, frequency or vaginal complaints.  Additional history obtained by medical chart. Patient discharged on 02/21/18 for DKA. During ED visit on 02/18/18 before admission, patient presented with glucose > 500.    Past Medical History:  Diagnosis Date  . AKI (acute kidney injury) ([place])   . Anemia, iron deficiency   . Anxiety and depression 05/18/2015  . CAD (coronary artery disease), native coronary artery 01/11/2018   Non-STEMI with severe three-vessel disease noted July 2019  . Depression   . Diabetes mellitus without complication ([place])   . Diabetic gastroparesis ([place])   . Diabetic neuropathy, type I diabetes mellitus ([place]) 05/18/2015  . DKA (diabetic ketoacidoses) ([place]) 06/16/2017  . DKA, type 1 ([place]) 03/18/2016  . Essential hypertension   . Gastroparesis   . GERD (gastroesophageal reflux disease)   . HLD (hyperlipidemia)   . MDD (major depressive disorder), single episode, severe , no psychosis ([place])   . Non-ST elevation (NSTEMI) myocardial infarction ([place])   . S/P CABG x 3 01/14/2018  . Tardive dyskinesia   . Vitamin B12 deficiency 08/16/2015    Patient Active Problem List   Diagnosis Date Noted   . S/P CABG x 3 01/14/2018  . CAD (coronary artery disease), native coronary artery 01/11/2018  . MDD (major depressive disorder), single episode, severe , no psychosis ([place])   . DKA (diabetic ketoacidoses) ([place]) 06/16/2017  . Tardive dyskinesia   . DKA, type 1 ([place]) 03/18/2016  . Noncompliance with treatment plan 03/13/2016  . Essential hypertension 01/24/2016  . HLD (hyperlipidemia)   . GERD (gastroesophageal reflux disease)   . AKI (acute kidney injury) ([place])   . Anemia, iron deficiency   . Vitamin B12 deficiency 08/16/2015  . Diabetic gastroparesis ([place])   . Diabetic neuropathy, type I diabetes mellitus ([place]) 05/18/2015  . Anxiety and depression 05/18/2015    Past Surgical History:  Procedure Laterality Date  . COLONOSCOPY  09/27/2014   at [facility]  . CORONARY ARTERY BYPASS GRAFT N/A 01/14/2018   Procedure: CORONARY ARTERY BYPASS GRAFTING (CABG)X3, RIGHT AND LEFT SAPHENOUS VEIN HARVEST, MAMMARY TAKE DOWN. MAMMARY TO LAD, SVG TO PD, SVG TO DISTAL CIRC.;  Surgeon: Grace Isaac, MD;  Location: [facility];  Service: Open Heart Surgery;  Laterality: N/A;  . ESOPHAGOGASTRODUODENOSCOPY  09/27/2014   at [facility], Dr Rolan Lipa. biospy neg for celiac, neg for H pylori.   . EYE SURGERY    . gailstones    . IR FLUORO GUIDE CV LINE RIGHT  02/01/2017  . IR FLUORO GUIDE CV LINE RIGHT  03/06/2017  . IR FLUORO GUIDE CV LINE RIGHT  03/25/2017  . IR GENERIC HISTORICAL  01/24/2016   IR FLUORO  GUIDE CV LINE RIGHT 01/24/2016 Darrell K Allred, PA-C [facility]-INTERV RAD  . IR GENERIC HISTORICAL  01/24/2016   IR US GUIDE VASC ACCESS RIGHT 01/24/2016 Darrell K Allred, PA-C [facility]  . IR US GUIDE VASC ACCESS RIGHT  02/01/2017  . IR US GUIDE VASC ACCESS RIGHT  03/06/2017  . IR US GUIDE VASC ACCESS RIGHT  03/25/2017  . LEFT HEART CATH AND CORONARY ANGIOGRAPHY N/A 01/07/2018   Procedure: LEFT HEART CATH AND CORONARY ANGIOGRAPHY;  Surgeon: Troy Sine, MD;  Location: [facility];  Service: Cardiovascular;   Laterality: N/A;  . POSTERIOR VITRECTOMY AND MEMBRANE PEEL-LEFT EYE  09/28/2002  . POSTERIOR VITRECTOMY AND MEMBRANE PEEL-RIGHT EYE  03/16/2002  . RETINAL DETACHMENT SURGERY    . TEE WITHOUT CARDIOVERSION N/A 01/14/2018   Procedure: TRANSESOPHAGEAL ECHOCARDIOGRAM (TEE);  Surgeon: Grace Isaac, MD;  Location: [facility];  Service: Open Heart Surgery;  Laterality: N/A;     OB History   None      Home Medications    Prior to Admission medications   Medication Sig Start Date End Date Taking? Authorizing Provider  acetaminophen (TYLENOL) 325 MG tablet Take 2 tablets (650 mg total) by mouth every 6 (six) hours as needed for mild pain. 01/25/18   Elgie Collard, PA-C  aspirin EC 325 MG EC tablet Take 1 tablet (325 mg total) by mouth daily. 01/25/18   Elgie Collard, PA-C  atorvastatin (LIPITOR) 80 MG tablet Take 1 tablet (80 mg total) by mouth daily at 6 PM. 01/25/18   Elgie Collard, PA-C  busPIRone (BUSPAR) 10 MG tablet Take 1 tablet (10 mg total) by mouth 3 (three) times daily. 02/21/18   Shelly Coss, MD  diphenhydrAMINE (BENADRYL) 25 mg capsule Take 25 mg by mouth every 6 (six) hours as needed for allergies.    [provider]  docusate sodium (COLACE) 100 MG capsule Take 1 capsule (100 mg total) by mouth daily as needed for mild constipation. 02/03/18   Burtis Junes, NP  fluticasone (FLONASE) 50 MCG/ACT nasal spray Place 2 sprays into both nostrils as needed for rhinitis.    [provider]  furosemide (LASIX) 20 MG tablet Take 1 tablet (20 mg total) by mouth daily. 01/25/18   Elgie Collard, PA-C  gabapentin (NEURONTIN) 600 MG tablet Take 600 mg by mouth 2 (two) times daily.    [provider]  guaiFENesin (MUCINEX) 600 MG 12 hr tablet Take 600 mg by mouth 2 (two) times daily.    [provider]  insulin aspart (NOVOLOG) 100 UNIT/ML injection Inject 0-12 Units into the skin 4 (four) times daily -  before meals and at bedtime. Uses a sliding scale: 70-119 = 0  units, 120-150 = 2 units, 151-180 =  4 units, 181-210 = 5 units, 211-240 = 6 units, 241-270 = 8 units, 271-300 = 9 units, 301-330 = 10 units, 331-360 = 11 units, 361-400 = 12 units, greater than 400 notify MD.    [provider]  insulin detemir (LEVEMIR) 100 UNIT/ML injection Inject 0.15 mLs (15 Units total) into the skin every morning. 11/28/17   [name], MD  metoCLOPramide (REGLAN) 10 MG tablet Take 1 tablet (10 mg total) by mouth every 6 (six) hours as needed for nausea (nausea/headache). 01/28/18   Jacqlyn Larsen, PA-C  metoprolol tartrate (LOPRESSOR) 25 MG tablet Take 0.5 tablets (12.5 mg total) by mouth 2 (two) times daily. 01/25/18   Elgie Collard, PA-C  ondansetron (  ZOFRAN) 4 MG tablet Take 1 tablet (4 mg total) by mouth every 6 (six) hours for 7 days. 02/23/18 03/02/18  Mortis, Alvie [place] I, PA-C  pantoprazole (PROTONIX) 40 MG tablet Take 1 tablet (40 mg total) by mouth daily. 11/28/17   [name], MD  potassium chloride (K-DUR,KLOR-CON) 10 MEQ tablet Take 1 tablet (10 mEq total) by mouth daily. 01/25/18   Elgie Collard, PA-C  promethazine (PHENERGAN) 12.5 MG tablet Take 1 tablet (12.5 mg total) by mouth every 6 (six) hours as needed for nausea or vomiting. 01/25/18   Elgie Collard, PA-C  pyridostigmine (MESTINON) 60 MG tablet Take 0.5 tablets (30 mg total) by mouth 3 (three) times daily. Take 1/2 with meals 11/11/17   Eugenie Filler, MD  sertraline (ZOLOFT) 25 MG tablet Take 1 tablet (25 mg total) by mouth daily. 02/21/18   Shelly Coss, MD    Family History Family History  Problem Relation Age of Onset  . Cystic fibrosis Mother   . Hypertension Father   . Diabetes Brother   . Hypertension Maternal Grandmother     Social History Social History   Tobacco Use  . Smoking status: Never Smoker  . Smokeless tobacco: Never Used  Substance Use Topics  . Alcohol use: No  . Drug use: No     Allergies   Anesthetics, amide; Penicillins; Buprenorphine hcl; Encainide; and  Metoclopramide  Review of Systems Review of Systems  Constitutional: Negative for chills, fatigue and fever.  Respiratory: Negative.   Cardiovascular: Negative.   Gastrointestinal: Positive for abdominal pain, nausea and vomiting.  Genitourinary: Negative.   Musculoskeletal: Negative.   Skin: Negative.   Neurological: Negative.   Hematological: Negative.   Psychiatric/Behavioral: Negative.    Physical Exam Updated Vital Signs BP (!) 165/72 (BP Location: Left Arm)   Pulse 94   Temp 98.6 F (37 C) (Oral)   Resp 20   LMP 01/09/2018 (Approximate)   SpO2 99%   Physical Exam  Constitutional: She appears well-developed and well-nourished. She is cooperative.  Lying in bed uncomfortable with emesis bag.  HENT:  Mouth/Throat: Oropharynx is clear and moist.  Eyes: Pupils are equal, round, and reactive to light. EOM are normal.  Cardiovascular: Normal rate, regular rhythm and normal heart sounds.  Pulmonary/Chest: Effort normal and breath sounds normal.  Abdominal: Soft. Normal appearance and bowel sounds are normal. She exhibits no distension. There is generalized tenderness.  Neurological: She is alert.  Skin: Skin is warm. Capillary refill takes less than 2 seconds. She is not diaphoretic. No pallor.  Nursing note and vitals reviewed.  ED Treatments / Results  Labs (all labs ordered are listed, but only abnormal results are displayed) Labs Reviewed  COMPREHENSIVE METABOLIC PANEL - Abnormal; Notable for the following components:      Result Value   Glucose, Bld 244 (*)    Alkaline Phosphatase 152 (*)    Total Bilirubin 1.7 (*)    All other components within normal limits  URINALYSIS, ROUTINE W REFLEX MICROSCOPIC - Abnormal; Notable for the following components:   Glucose, UA >=500 (*)    Ketones, ur 20 (*)    Protein, ur 100 (*)    All other components within normal limits  BLOOD GAS, VENOUS - Abnormal; Notable for the following components:   pCO2, Ven 41.7 (*)    All  other components within normal limits  CBG MONITORING, ED - Abnormal; Notable for the following components:   Glucose-Capillary 209 (*)    All other  components within normal limits  LIPASE, BLOOD  CBC  I-STAT BETA HCG BLOOD, ED ([facility], [facility], [facility] ONLY)    EKG None  Radiology No results found.  Procedures Procedures (including critical care time)  Medications Ordered in ED Medications  promethazine (PHENERGAN) injection 25 mg (25 mg Intramuscular Given 02/23/18 1722)     Initial Impression / Assessment and Plan / ED Course  Triage vital signs and the nursing notes have been reviewed.  Pertinent labs & imaging results that were available during care of the patient were reviewed and considered in medical decision making (see chart for details).  Prior to interview, patient observed talking to pharmacy tech. She was sitting comfortably and talking normally. However, when patient was speaking to this provider she began taking shallow breaths and was endorsing increased nausea and vomiting, but no vomiting visualized in emesis bag or trash can. Her physical presentation is out of proportion to physical exam findings.   Patient concerned about gastroparesis and has complaints of generalized abdominal pain, nausea and vomiting. Only significant finding is generalized abdominal tenderness. No rigidity, guarding or abnormal bowel sounds to suggest a more acute GI issue such as perforation, appendicitis or pancreatitis. No indication for abdominal imaging. Patient's history is not consistent with an acute infectious etiology that could be causing these complaints. Given pt's recent history of DKA (2 admissions this month) and reports of excessive vomiting, will evaluate for DKA and proceed from there.However,  Clinical Course as of Feb 23 1800  Sun Feb 23, 2018  1538 Dr. Tomi Bamberger attempted to establish IV in left IJ and EJ without success. Blood eventually able to drawn from femoral vein.   [GM]  9767  Labs are reassuring. No anion gap or signs of acidosis. No electrolyte abnormalities that warrant emergent replenishment.   [GM]  1757 IM Phenergan given for nausea which patient reports relief with. Will PO challenge prior to discharge. Patient states she has hospital follow-up appointment scheduled for 02/28/18 which is reassuring. Labs reviewed with patient. She requests Zofran refill for nausea.   [GM]    Clinical Course User Index [GM] Mortis, Jonelle Sports, PA-C   Final Clinical Impressions(s) / ED Diagnoses  1. Nausea/Vomiting. Rx for Zofran. Patient has follow-up appointment with PCP on 02/28/18. Education provided on s/s that warrant return to the PCP vs ED.  Dispo: Home. After thorough clinical evaluation, this patient is determined to be medically stable and can be safely discharged with the previously mentioned treatment and/or outpatient follow-up/referral(s). At this time, there are no other apparent medical conditions that require further screening, evaluation or treatment.   Final diagnoses:  Non-intractable vomiting with nausea, unspecified vomiting type    ED Discharge Orders         Ordered    ondansetron (ZOFRAN) 4 MG tablet  Every 6 hours     02/23/18 1800            Mortis, [place] I, PA-C 02/23/18 1801    Dorie Rank, MD 02/24/18 2125

## 2018-02-23 NOTE — Discharge Instructions (Addendum)
Your labs look fine today. There are no signs of DKA.  Please try to continue to stay hydrated at home with lots of fluids. Advance your diet slowly given your gastroparesis. I recommend you eating smaller meals with liquids and soft foods that are easier for your body to digest.  It is important that you keep your follow-up appointment with your doctor on Sept. 6th.  I have written you a new prescription for Zofran. This medication works best when taken in advance and scheduled. I recommend that you take it four times daily even if you do not feel nauseous.

## 2018-02-27 ENCOUNTER — Telehealth: Payer: Self-pay

## 2018-02-27 NOTE — Telephone Encounter (Signed)
Attempted to contact the patient to remind her of her appointment tomorrow  - 02/28/18 and to inform her what documentation she needs to bring to renew her [place].  Wynonia Hazard, [facility] Financial Counselor can also meet with her tomorrow to assist with the [employer] process.  Her voicemail was full, but an SMS message with the [facility] phone # was sent. If she calls back, please have her speak to [place].

## 2018-02-28 ENCOUNTER — Encounter

## 2018-02-28 ENCOUNTER — Inpatient Hospital Stay: Payer: Self-pay | Admitting: Family Medicine

## 2018-02-28 ENCOUNTER — Ambulatory Visit: Payer: Self-pay

## 2018-03-04 ENCOUNTER — Encounter: Payer: Self-pay | Admitting: Cardiovascular Disease

## 2018-03-04 ENCOUNTER — Ambulatory Visit (INDEPENDENT_AMBULATORY_CARE_PROVIDER_SITE_OTHER): Payer: Medicare Other | Admitting: Cardiovascular Disease

## 2018-03-04 VITALS — BP 94/52 | HR 80 | Ht 61.0 in | Wt 138.0 lb

## 2018-03-04 DIAGNOSIS — I2510 Atherosclerotic heart disease of native coronary artery without angina pectoris: Secondary | ICD-10-CM | POA: Diagnosis not present

## 2018-03-04 MED ORDER — FUROSEMIDE 40 MG PO TABS: 40.0000 mg | ORAL_TABLET | Freq: Every day | ORAL | 3 refills | Status: DC

## 2018-03-04 MED ORDER — POTASSIUM CHLORIDE ER 10 MEQ PO TBCR: 10.0000 meq | EXTENDED_RELEASE_TABLET | Freq: Every day | ORAL | 3 refills | Status: DC

## 2018-03-04 NOTE — Patient Instructions (Signed)
Medication Instructions:  Your physician has recommended you make the following change in your medication:   INCREASE Lasix (Furosemdie) to 40 mg daily START Kdur (Potassium supplement) 10 mEq once daily   Labwork: None Ordered   Testing/Procedures: None Ordered   Follow-Up: Your physician recommends that you schedule a follow-up appointment in: 3 months with Dr. Acie Fredrickson   If you need a refill on your cardiac medications before your next appointment, please call your pharmacy.   Thank you for choosing [facility]! Christen Bame, RN [phone]

## 2018-03-04 NOTE — Progress Notes (Signed)
Cardiology Office Note:    Date:  03/04/2018   ID:  [name], DOB September 10, 1965, MRN [id number]  PCP:  [name], MD  Cardiologist:  Mertie Moores, MD   Referring MD: [name], MD   Problem list 1.  Coronary artery disease-status post coronary artery bypass grafting 2.  Poorly controlled diabetes mellitus-complicated by noncompliance and neuropathy 3.  Hyperlipidemia  Chief Complaint  Patient presents with  . Coronary Artery Disease     Sept. 10, 2019   [name] is a 52 y.o. female with a hx of poorly controlled diabetes mellitus and coronary artery disease.  She is status post coronary artery bypass grafting on January 14, 2018: She underwent CABG x 3 utilizing LIMA to LAD, SVG to PDA, and SVG to OM.  She is been to the emergency room over 50 times in the past year for diabetic ketoacidosis and gastroparesis. She was in the emergency room on September 1 with intractable nausea and vomiting.  She was not in DKA at that time.  Seen in the office for the first time today . Diabetes is under fairly good control currently   Has lots of issues with gastroparesis   Past Medical History:  Diagnosis Date  . AKI (acute kidney injury) ([place])   . Anemia, iron deficiency   . Anxiety and depression 05/18/2015  . CAD (coronary artery disease), native coronary artery 01/11/2018   Non-STEMI with severe three-vessel disease noted July 2019  . Depression   . Diabetes mellitus without complication ([place])   . Diabetic gastroparesis ([place])   . Diabetic neuropathy, type I diabetes mellitus ([place]) 05/18/2015  . DKA (diabetic ketoacidoses) ([place]) 06/16/2017  . DKA, type 1 ([place]) 03/18/2016  . Essential hypertension   . Gastroparesis   . GERD (gastroesophageal reflux disease)   . HLD (hyperlipidemia)   . MDD (major depressive disorder), single episode, severe , no psychosis ([place])   . Non-ST elevation (NSTEMI) myocardial infarction ([place])   . S/P CABG x 3 01/14/2018  . Tardive  dyskinesia   . Vitamin B12 deficiency 08/16/2015    Past Surgical History:  Procedure Laterality Date  . COLONOSCOPY  09/27/2014   [facility]  . CORONARY ARTERY BYPASS GRAFT N/A 01/14/2018   Procedure: CORONARY ARTERY BYPASS GRAFTING (CABG)X3, RIGHT AND LEFT SAPHENOUS VEIN HARVEST, MAMMARY TAKE DOWN. MAMMARY TO LAD, SVG TO PD, SVG TO DISTAL CIRC.;  Surgeon: Grace Isaac, MD;  Location: [facility];  Service: Open Heart Surgery;  Laterality: N/A;  . ESOPHAGOGASTRODUODENOSCOPY  09/27/2014   at [facility], Dr Rolan Lipa. biospy neg for celiac, neg for H pylori.   . EYE SURGERY    . gailstones    . IR FLUORO GUIDE CV LINE RIGHT  02/01/2017  . IR FLUORO GUIDE CV LINE RIGHT  03/06/2017  . IR FLUORO GUIDE CV LINE RIGHT  03/25/2017  . IR GENERIC HISTORICAL  01/24/2016   IR FLUORO GUIDE CV LINE RIGHT 01/24/2016 Darrell K Allred, PA-C [facility]  . IR GENERIC HISTORICAL  01/24/2016   IR US GUIDE VASC ACCESS RIGHT 01/24/2016 Darrell K Allred, PA-C [facility]  . IR US GUIDE VASC ACCESS RIGHT  02/01/2017  . IR US GUIDE VASC ACCESS RIGHT  03/06/2017  . IR US GUIDE VASC ACCESS RIGHT  03/25/2017  . LEFT HEART CATH AND CORONARY ANGIOGRAPHY N/A 01/07/2018   Procedure: LEFT HEART CATH AND CORONARY ANGIOGRAPHY;  Surgeon: Troy Sine, MD;  Location: [facility];  Service: Cardiovascular;  Laterality:  N/A;  . POSTERIOR VITRECTOMY AND MEMBRANE PEEL-LEFT EYE  09/28/2002  . POSTERIOR VITRECTOMY AND MEMBRANE PEEL-RIGHT EYE  03/16/2002  . RETINAL DETACHMENT SURGERY    . TEE WITHOUT CARDIOVERSION N/A 01/14/2018   Procedure: TRANSESOPHAGEAL ECHOCARDIOGRAM (TEE);  Surgeon: Grace Isaac, MD;  Location: [facility];  Service: Open Heart Surgery;  Laterality: N/A;    Current Medications: Current Meds  Medication Sig  . acetaminophen (TYLENOL) 325 MG tablet Take 2 tablets (650 mg total) by mouth every 6 (six) hours as needed for mild pain.  Marland Kitchen aspirin EC 325 MG EC tablet Take 1 tablet (325 mg total) by mouth daily.  Marland Kitchen  atorvastatin (LIPITOR) 80 MG tablet Take 1 tablet (80 mg total) by mouth daily at 6 PM.  . busPIRone (BUSPAR) 10 MG tablet Take 1 tablet (10 mg total) by mouth 3 (three) times daily.  . diphenhydrAMINE (BENADRYL) 25 mg capsule Take 25 mg by mouth every 6 (six) hours as needed for allergies.  Marland Kitchen docusate sodium (COLACE) 100 MG capsule Take 1 capsule (100 mg total) by mouth daily as needed for mild constipation.  . fluticasone (FLONASE) 50 MCG/ACT nasal spray Place 2 sprays into both nostrils as needed for rhinitis.  Marland Kitchen gabapentin (NEURONTIN) 600 MG tablet Take 600 mg by mouth 2 (two) times daily.  Marland Kitchen guaiFENesin (MUCINEX) 600 MG 12 hr tablet Take 600 mg by mouth 2 (two) times daily.  . insulin aspart (NOVOLOG) 100 UNIT/ML injection Inject 0-12 Units into the skin 4 (four) times daily -  before meals and at bedtime. Uses a sliding scale: 70-119 = 0 units, 120-150 = 2 units, 151-180 =  4 units, 181-210 = 5 units, 211-240 = 6 units, 241-270 = 8 units, 271-300 = 9 units, 301-330 = 10 units, 331-360 = 11 units, 361-400 = 12 units, greater than 400 notify MD.  . insulin detemir (LEVEMIR) 100 UNIT/ML injection Inject 0.15 mLs (15 Units total) into the skin every morning.  . metoprolol tartrate (LOPRESSOR) 25 MG tablet Take 0.5 tablets (12.5 mg total) by mouth 2 (two) times daily.  . pantoprazole (PROTONIX) 40 MG tablet Take 1 tablet (40 mg total) by mouth daily.  . promethazine (PHENERGAN) 12.5 MG tablet Take 1 tablet (12.5 mg total) by mouth every 6 (six) hours as needed for nausea or vomiting.  . pyridostigmine (MESTINON) 60 MG tablet Take 60 mg by mouth 3 (three) times daily.  . [DISCONTINUED] furosemide (LASIX) 20 MG tablet Take 1 tablet (20 mg total) by mouth daily.     Allergies:   Anesthetics, amide; Penicillins; Buprenorphine hcl; Encainide; and Metoclopramide   Social History   Socioeconomic History  . Marital status: Single    Spouse name: Not on file  . Number of children: 1  . Years of  education: Not on file  . Highest education level: Not on file  Occupational History  . Occupation: Unemployed  Social Needs  . Financial resource strain: Patient refused  . Food insecurity:    Worry: Not on file    Inability: Not on file  . Transportation needs:    Medical: Patient refused    Non-medical: Patient refused  Tobacco Use  . Smoking status: Never Smoker  . Smokeless tobacco: Never Used  Substance and Sexual Activity  . Alcohol use: No  . Drug use: No  . Sexual activity: Not Currently  Lifestyle  . Physical activity:    Days per week: Patient refused    Minutes per session: Not on file  .  Stress: Patient refused  Relationships  . Social connections:    Talks on phone: Not on file    Gets together: Not on file    Attends religious service: Not on file    Active member of club or organization: Not on file    Attends meetings of clubs or organizations: Not on file    Relationship status: Not on file  Other Topics Concern  . Not on file  Social History Narrative  . Not on file     Family History: The patient's family history includes Cystic fibrosis in her mother; Diabetes in her brother; Hypertension in her father and maternal grandmother.  ROS:   Please see the history of present illness.     All other systems reviewed and are negative.  EKGs/Labs/Other Studies Reviewed:    The following studies were reviewed today:   EKG:     Recent Labs: 03/25/2017: TSH 0.632 02/19/2018: Magnesium 1.8 02/23/2018: ALT 19; BUN 12; Creatinine, Ser 0.88; Hemoglobin 12.2; Platelets 308; Potassium 3.9; Sodium 142  Recent Lipid Panel    Component Value Date/Time   CHOL 121 01/07/2018 0510   TRIG 101 01/07/2018 0510   HDL 47 01/07/2018 0510   CHOLHDL 2.6 01/07/2018 0510   VLDL 20 01/07/2018 0510   LDLCALC 54 01/07/2018 0510    Physical Exam:    VS:  BP (!) 94/52   Pulse 80   Ht 5\' 1"  (1.549 m)   Wt 138 lb (62.6 kg)   SpO2 98%   BMI 26.07 kg/m     Wt  Readings from Last 3 Encounters:  03/04/18 138 lb (62.6 kg)  02/19/18 130 lb 11.7 oz (59.3 kg)  02/17/18 139 lb (63 kg)     GEN:   Middle age female HEENT: Normal NECK: No JVD; No carotid bruits LYMPHATICS: No lymphadenopathy CARDIAC: RRR, , healing sternotomy,   RESPIRATORY:  Clear to auscultation without rales, wheezing or rhonchi  ABDOMEN: Soft, non-tender, non-distended MUSCULOSKELETAL:  1-2+_ pitting edema  SKIN: Warm and dry NEUROLOGIC:  Alert and oriented x 3 PSYCHIATRIC:  Normal affect   ASSESSMENT:    No diagnosis found. PLAN:    In order of problems listed above:  1. 1.  Coronary artery disease: Patient presents for follow-up of her coronary artery bypass grafting in July.  She is having the usual amount of chest soreness.  She is not having any episodes of angina. To new aspirin 325 for another month or so. Crease her dose to 81 mg.  2.  Leg edema :   LV systolic function is normal.  She likely has diastolic dysfunction.  The Lasix 20 mg is not resulting in any diuresis.  We will increase the Lasix to 40 mg a day.  We have added potassium chloride 10 mill equivalent today.  She will only take the Lasix as needed but I suspect she will need to 3 days at least.  I will see her in 3 months for follow-up visit.    Medication Adjustments/Labs and Tests Ordered: Current medicines are reviewed at length with the patient today.  Concerns regarding medicines are outlined above.  No orders of the defined types were placed in this encounter.  Meds ordered this encounter  Medications  . potassium chloride (K-DUR) 10 MEQ tablet    Sig: Take 1 tablet (10 mEq total) by mouth daily.    Dispense:  90 tablet    Refill:  3  . furosemide (LASIX) 40 MG tablet  Sig: Take 1 tablet (40 mg total) by mouth daily.    Dispense:  90 tablet    Refill:  3    Patient Instructions  Medication Instructions:  Your physician has recommended you make the following change in your  medication:   INCREASE Lasix (Furosemdie) to 40 mg daily START Kdur (Potassium supplement) 10 mEq once daily   Labwork: None Ordered   Testing/Procedures: None Ordered   Follow-Up: Your physician recommends that you schedule a follow-up appointment in: 3 months with Dr. Acie Fredrickson   If you need a refill on your cardiac medications before your next appointment, please call your pharmacy.   Thank you for choosing [facility]! Christen Bame, RN [phone]       Signed, Mertie Moores, MD  03/04/2018 12:37 PM    [place]

## 2018-03-05 ENCOUNTER — Telehealth (HOSPITAL_COMMUNITY): Payer: Self-pay

## 2018-03-05 NOTE — Telephone Encounter (Signed)
Attempted to call patient in regards to Cardiac Rehab - LM on VM 

## 2018-03-12 ENCOUNTER — Telehealth (HOSPITAL_COMMUNITY): Payer: Self-pay

## 2018-03-12 NOTE — Telephone Encounter (Signed)
Attempted to call pt a 2nd time- LM ON VM ° °Mailed letter out °

## 2018-03-13 ENCOUNTER — Encounter (HOSPITAL_COMMUNITY): Payer: Self-pay

## 2018-03-19 ENCOUNTER — Telehealth (HOSPITAL_COMMUNITY): Payer: Self-pay

## 2018-03-19 NOTE — Telephone Encounter (Signed)
3rd attempt to contact patient in regards to Cardiac Rehab - lm on vm °

## 2018-03-20 ENCOUNTER — Ambulatory Visit: Payer: Medicare Other | Admitting: Licensed Clinical Social Worker

## 2018-03-20 ENCOUNTER — Telehealth: Payer: Self-pay

## 2018-03-20 ENCOUNTER — Encounter: Payer: Self-pay | Admitting: Family Medicine

## 2018-03-20 ENCOUNTER — Ambulatory Visit: Payer: Medicare Other | Attending: Family Medicine | Admitting: Family Medicine

## 2018-03-20 ENCOUNTER — Telehealth: Payer: Self-pay | Admitting: Family Medicine

## 2018-03-20 ENCOUNTER — Other Ambulatory Visit: Payer: Self-pay

## 2018-03-20 VITALS — BP 126/73 | HR 77 | Temp 97.5°F | Ht 61.0 in | Wt 135.8 lb

## 2018-03-20 DIAGNOSIS — E785 Hyperlipidemia, unspecified: Secondary | ICD-10-CM | POA: Insufficient documentation

## 2018-03-20 DIAGNOSIS — Z951 Presence of aortocoronary bypass graft: Secondary | ICD-10-CM

## 2018-03-20 DIAGNOSIS — R600 Localized edema: Secondary | ICD-10-CM | POA: Diagnosis not present

## 2018-03-20 DIAGNOSIS — I2510 Atherosclerotic heart disease of native coronary artery without angina pectoris: Secondary | ICD-10-CM | POA: Insufficient documentation

## 2018-03-20 DIAGNOSIS — Z79899 Other long term (current) drug therapy: Secondary | ICD-10-CM | POA: Diagnosis not present

## 2018-03-20 DIAGNOSIS — Z880 Allergy status to penicillin: Secondary | ICD-10-CM | POA: Diagnosis not present

## 2018-03-20 DIAGNOSIS — F329 Major depressive disorder, single episode, unspecified: Secondary | ICD-10-CM | POA: Diagnosis not present

## 2018-03-20 DIAGNOSIS — K3184 Gastroparesis: Secondary | ICD-10-CM | POA: Diagnosis not present

## 2018-03-20 DIAGNOSIS — I252 Old myocardial infarction: Secondary | ICD-10-CM | POA: Insufficient documentation

## 2018-03-20 DIAGNOSIS — F419 Anxiety disorder, unspecified: Secondary | ICD-10-CM

## 2018-03-20 DIAGNOSIS — K219 Gastro-esophageal reflux disease without esophagitis: Secondary | ICD-10-CM | POA: Insufficient documentation

## 2018-03-20 DIAGNOSIS — I10 Essential (primary) hypertension: Secondary | ICD-10-CM | POA: Insufficient documentation

## 2018-03-20 DIAGNOSIS — Z794 Long term (current) use of insulin: Secondary | ICD-10-CM | POA: Insufficient documentation

## 2018-03-20 DIAGNOSIS — D509 Iron deficiency anemia, unspecified: Secondary | ICD-10-CM | POA: Diagnosis not present

## 2018-03-20 DIAGNOSIS — Z748 Other problems related to care provider dependency: Secondary | ICD-10-CM

## 2018-03-20 DIAGNOSIS — Z7982 Long term (current) use of aspirin: Secondary | ICD-10-CM | POA: Diagnosis not present

## 2018-03-20 DIAGNOSIS — E1143 Type 2 diabetes mellitus with diabetic autonomic (poly)neuropathy: Secondary | ICD-10-CM | POA: Diagnosis not present

## 2018-03-20 DIAGNOSIS — Z884 Allergy status to anesthetic agent status: Secondary | ICD-10-CM | POA: Insufficient documentation

## 2018-03-20 DIAGNOSIS — E1043 Type 1 diabetes mellitus with diabetic autonomic (poly)neuropathy: Secondary | ICD-10-CM | POA: Insufficient documentation

## 2018-03-20 DIAGNOSIS — E1065 Type 1 diabetes mellitus with hyperglycemia: Secondary | ICD-10-CM | POA: Diagnosis not present

## 2018-03-20 DIAGNOSIS — F32A Depression, unspecified: Secondary | ICD-10-CM

## 2018-03-20 DIAGNOSIS — E1040 Type 1 diabetes mellitus with diabetic neuropathy, unspecified: Secondary | ICD-10-CM | POA: Diagnosis not present

## 2018-03-20 LAB — GLUCOSE, POCT (MANUAL RESULT ENTRY): POC GLUCOSE: 112 mg/dL — AB (ref 70–99)

## 2018-03-20 MED ORDER — PANTOPRAZOLE SODIUM 40 MG PO TBEC: 40.0000 mg | DELAYED_RELEASE_TABLET | Freq: Every day | ORAL | 3 refills | Status: DC

## 2018-03-20 MED ORDER — METOPROLOL TARTRATE 25 MG PO TABS: 12.5000 mg | ORAL_TABLET | Freq: Two times a day (BID) | ORAL | 3 refills | Status: DC

## 2018-03-20 MED ORDER — POTASSIUM CHLORIDE ER 10 MEQ PO TBCR: 10.0000 meq | EXTENDED_RELEASE_TABLET | Freq: Every day | ORAL | 3 refills | Status: DC

## 2018-03-20 MED ORDER — "INSULIN SYRINGE-NEEDLE U-100 31G X 15/64"" 0.5 ML MISC": 1.0000 | Freq: Four times a day (QID) | 5 refills | Status: DC

## 2018-03-20 MED ORDER — GLUCOSE BLOOD VI STRP: ORAL_STRIP | 12 refills | Status: DC

## 2018-03-20 MED ORDER — PROMETHAZINE HCL 12.5 MG PO TABS: 12.5000 mg | ORAL_TABLET | Freq: Four times a day (QID) | ORAL | 1 refills | Status: DC | PRN

## 2018-03-20 MED ORDER — ATORVASTATIN CALCIUM 80 MG PO TABS: 80.0000 mg | ORAL_TABLET | Freq: Every day | ORAL | 3 refills | Status: DC

## 2018-03-20 MED ORDER — BUSPIRONE HCL 10 MG PO TABS: 10.0000 mg | ORAL_TABLET | Freq: Three times a day (TID) | ORAL | 3 refills | Status: DC

## 2018-03-20 MED ORDER — INSULIN DETEMIR 100 UNIT/ML ~~LOC~~ SOLN: 15.0000 [IU] | SUBCUTANEOUS | 1 refills | Status: DC

## 2018-03-20 MED ORDER — ONETOUCH VERIO W/DEVICE KIT: PACK | 0 refills | Status: DC

## 2018-03-20 MED ORDER — FUROSEMIDE 40 MG PO TABS: 40.0000 mg | ORAL_TABLET | Freq: Every day | ORAL | 3 refills | Status: DC

## 2018-03-20 MED ORDER — ONETOUCH DELICA PLUS LANCET33G MISC: 15.0000 [IU] | 11 refills | Status: DC

## 2018-03-20 MED FILL — !LEVEMIR 100 UNITS/ML VIAL: 100/ML | 42 days supply | Qty: 10 | Fill #0

## 2018-03-20 MED FILL — PROMETHAZINE 12.5 MG TABLET: 12.5 | 7 days supply | Qty: 30 | Fill #0

## 2018-03-20 MED FILL — ATORVASTATIN 80 MG TABLET: 80 | 30 days supply | Qty: 30 | Fill #0

## 2018-03-20 MED FILL — busPIRone HCL 10 MG TABS: 10 | 30 days supply | Qty: 90 | Fill #0

## 2018-03-20 MED FILL — PANTOPRAZOLE SOD DR 40 MG T: 40 | 30 days supply | Qty: 30 | Fill #0

## 2018-03-20 MED FILL — FUROSEMIDE 40 MG TAB: 40 | 30 days supply | Qty: 30 | Fill #0

## 2018-03-20 MED FILL — METOPROLOL TARTRATE 25 MG T: 25 | 30 days supply | Qty: 30 | Fill #0

## 2018-03-20 MED FILL — POTASSIUM CHLORIDE ER 10 ME: 10 | 30 days supply | Qty: 30 | Fill #0

## 2018-03-20 NOTE — Telephone Encounter (Signed)
Meter and supplies have been sent to onsite pharmacy

## 2018-03-20 NOTE — Telephone Encounter (Addendum)
Met with the patient when she was in the clinic today.  She explained that she is still living with her sister but her sister and her sister's husband are planning to move to [place] next weekend - 03/29/18. The patient plans to move with them but would eventually like to have her own apartment.  Provided her with the contact information for [employer] as well as [employer]. Explained to her how [email or web address] can help locate available apartment and provided her with the current listing in [place] within her price range ( $400/month rent limit).   Call placed [employer] and confirmed that the waiting list is still open and patient will need to go online and register.  The wait for public housing is about 18 months and for section 8 is about 2 years.  This information was shared with the patient and she confirmed that she has an email address to apply for housing.    Her Blue card is expiring and she requested information about re-applying.  Provided her with the list of information needed to re-apply and instructed her to call [facility] to schedule an appointment with the financial counselor when she has collected all of the information.    She is also interested in part D as she has no medication coverage. Provided her with the phone # for [employer] [facility] as well as the [facility] # [phone] to enroll over the phone.  She was not interested in speaking to legal [place] at this time.  As per Mr Luciana Axe at [employer], open enrollment for [employer] C/D is 04/08/18-05/31/18.  She currently uses [employer] but when she moves to [place], she will not be able to access [employer].  She completed an application for [place] to provide transportation to her medical appointments after she moves next week. Call placed to [facility] transportation, spoke to [place] who confirmed  their fax # [phone] and the application was then faxed to that number.   She is also requesting home health  - SN/PT.  Informed her that Dr Margarita Rana would be notified of the request. She said that if approved she would like to have the services provided by [facility].

## 2018-03-20 NOTE — Patient Instructions (Signed)
Edema Edema is when you have too much fluid in your body or under your skin. Edema may make your legs, feet, and ankles swell up. Swelling is also common in looser tissues, like around your eyes. This is a common condition. It gets more common as you get older. There are many possible causes of edema. Eating too much salt (sodium) and being on your feet or sitting for a long time can cause edema in your legs, feet, and ankles. Hot weather may make edema worse. Edema is usually painless. Your skin may look swollen or shiny. Follow these instructions at home:  Keep the swollen body part raised (elevated) above the level of your heart when you are sitting or lying down.  Do not sit still or stand for a long time.  Do not wear tight clothes. Do not wear garters on your upper legs.  Exercise your legs. This can help the swelling go down.  Wear elastic bandages or support stockings as told by your doctor.  Eat a low-salt (low-sodium) diet to reduce fluid as told by your doctor.  Depending on the cause of your swelling, you may need to limit how much fluid you drink (fluid restriction).  Take over-the-counter and prescription medicines only as told by your doctor. Contact a doctor if:  Treatment is not working.  You have heart, liver, or kidney disease and have symptoms of edema.  You have sudden and unexplained weight gain. Get help right away if:  You have shortness of breath or chest pain.  You cannot breathe when you lie down.  You have pain, redness, or warmth in the swollen areas.  You have heart, liver, or kidney disease and get edema all of a sudden.  You have a fever and your symptoms get worse all of a sudden. Summary  Edema is when you have too much fluid in your body or under your skin.  Edema may make your legs, feet, and ankles swell up. Swelling is also common in looser tissues, like around your eyes.  Raise (elevate) the swollen body part above the level of your  heart when you are sitting or lying down.  Follow your doctor's instructions about diet and how much fluid you can drink (fluid restriction). This information is not intended to replace advice given to you by your health care provider. Make sure you discuss any questions you have with your health care provider. Document Released: 11/28/2007 Document Revised: 06/29/2016 Document Reviewed: 06/29/2016 Elsevier Interactive Patient Education  2017 Elsevier Inc.  

## 2018-03-20 NOTE — Telephone Encounter (Signed)
Patient called to check on the glucometer and strips. She would like to know if we have it for her. Please follow up with patient.

## 2018-03-20 NOTE — Telephone Encounter (Signed)
Transportation application faxed to [facility] transportation has failed three times.  Call received from Ms Gloriann Loan with [employer], stating that she has only received the fax cover sheet 13 times.... She requested that the 2 pages of the application be faxed separately. Confirmed fax # [phone] The pages were faxed as requested and each still failed,

## 2018-03-20 NOTE — Progress Notes (Signed)
Patient states that legs and ankles are swelling.

## 2018-03-20 NOTE — Progress Notes (Signed)
Subjective:  Patient ID: [name], female    DOB: 04-Jul-1965  Age: 52 y.o. MRN: [id number]  CC: Diabetes  HPI [name] is a 52 year old female with a history of uncontrolled type 1 diabetes mellitus (A1c 8.2), diabetic neuropathy, diabetic gastroparesis, hypertension, anemia, anxiety and depression, multiple ED visits and hospitalizations for diabetic gastroparesis, CAD s/p CABG x3 (LIMA to LAD, SVG to PDA, SVG to OM) in 12/2017 here for a follow up visit. She saw her Cardiologist, Dr Acie Fredrickson 2 weeks ago at which time her Lasix had been increased from 20 mg to 40 mg due to pedal edema however she has still been taking the 20 mg and complains of pedal edema.She is concerned about the visible thoracotomy scar on her chestwall but denies chest pain.She has also not been taking her Potassium pills.  With regards to her Diabetes her appointment with Endocrine is not until 04/2018 as she missed her previous appointment during her hospitalization but she endorses compliance with her medications and is needing a refill of her Levemir. Her anxiety is stable and she has had no recent flare of her gastroparesis. She is also requesting a referral for home health.  Past Medical History:  Diagnosis Date  . AKI (acute kidney injury) ([place])   . Anemia, iron deficiency   . Anxiety and depression 05/18/2015  . CAD (coronary artery disease), native coronary artery 01/11/2018   Non-STEMI with severe three-vessel disease noted July 2019  . Depression   . Diabetes mellitus without complication ([place])   . Diabetic gastroparesis ([place])   . Diabetic neuropathy, type I diabetes mellitus ([place]) 05/18/2015  . DKA (diabetic ketoacidoses) ([place]) 06/16/2017  . DKA, type 1 ([place]) 03/18/2016  . Essential hypertension   . Gastroparesis   . GERD (gastroesophageal reflux disease)   . HLD (hyperlipidemia)   . MDD (major depressive disorder), single episode, severe , no psychosis ([place])   . Non-ST elevation (NSTEMI)  myocardial infarction ([place])   . S/P CABG x 3 01/14/2018  . Tardive dyskinesia   . Vitamin B12 deficiency 08/16/2015    Past Surgical History:  Procedure Laterality Date  . COLONOSCOPY  09/27/2014   at [facility]  . CORONARY ARTERY BYPASS GRAFT N/A 01/14/2018   Procedure: CORONARY ARTERY BYPASS GRAFTING (CABG)X3, RIGHT AND LEFT SAPHENOUS VEIN HARVEST, MAMMARY TAKE DOWN. MAMMARY TO LAD, SVG TO PD, SVG TO DISTAL CIRC.;  Surgeon: Grace Isaac, MD;  Location: [place];  Service: Open Heart Surgery;  Laterality: N/A;  . ESOPHAGOGASTRODUODENOSCOPY  09/27/2014   at [facility], Dr Rolan Lipa. biospy neg for celiac, neg for H pylori.   . EYE SURGERY    . gailstones    . IR FLUORO GUIDE CV LINE RIGHT  02/01/2017  . IR FLUORO GUIDE CV LINE RIGHT  03/06/2017  . IR FLUORO GUIDE CV LINE RIGHT  03/25/2017  . IR GENERIC HISTORICAL  01/24/2016   IR FLUORO GUIDE CV LINE RIGHT 01/24/2016 Darrell K Allred, PA-C [facility]  . IR GENERIC HISTORICAL  01/24/2016   IR US GUIDE VASC ACCESS RIGHT 01/24/2016 Darrell K Allred, PA-C [facility]  . IR US GUIDE VASC ACCESS RIGHT  02/01/2017  . IR US GUIDE VASC ACCESS RIGHT  03/06/2017  . IR US GUIDE VASC ACCESS RIGHT  03/25/2017  . LEFT HEART CATH AND CORONARY ANGIOGRAPHY N/A 01/07/2018   Procedure: LEFT HEART CATH AND CORONARY ANGIOGRAPHY;  Surgeon: Troy Sine, MD;  Location: [facility];  Service: Cardiovascular;  Laterality: N/A;  .  POSTERIOR VITRECTOMY AND MEMBRANE PEEL-LEFT EYE  09/28/2002  . POSTERIOR VITRECTOMY AND MEMBRANE PEEL-RIGHT EYE  03/16/2002  . RETINAL DETACHMENT SURGERY    . TEE WITHOUT CARDIOVERSION N/A 01/14/2018   Procedure: TRANSESOPHAGEAL ECHOCARDIOGRAM (TEE);  Surgeon: Grace Isaac, MD;  Location: [place];  Service: Open Heart Surgery;  Laterality: N/A;    Allergies  Allergen Reactions  . Anesthetics, Amide Nausea And Vomiting  . Penicillins Diarrhea, Nausea And Vomiting and Other (See Comments)    Has patient had a PCN reaction causing  immediate rash, facial/tongue/throat swelling, SOB or lightheadedness with hypotension: Yes Has patient had a PCN reaction causing severe rash involving mucus membranes or skin necrosis: No Has patient had a PCN reaction that required hospitalization No Has patient had a PCN reaction occurring within the last 10 years: Yes  If all of the above answers are "NO", then may proceed with Cephalosporin use.   . Buprenorphine Hcl Rash  . Encainide Nausea And Vomiting  . Metoclopramide Other (See Comments)    Dystonia, muscle rigidity.  Patient states she is only allergic to the "pill form, not the IV"     Outpatient Medications Prior to Visit  Medication Sig Dispense Refill  . acetaminophen (TYLENOL) 325 MG tablet Take 2 tablets (650 mg total) by mouth every 6 (six) hours as needed for mild pain.    Marland Kitchen aspirin EC 325 MG EC tablet Take 1 tablet (325 mg total) by mouth daily. 30 tablet 0  . diphenhydrAMINE (BENADRYL) 25 mg capsule Take 25 mg by mouth every 6 (six) hours as needed for allergies.    Marland Kitchen docusate sodium (COLACE) 100 MG capsule Take 1 capsule (100 mg total) by mouth daily as needed for mild constipation. 10 capsule 0  . fluticasone (FLONASE) 50 MCG/ACT nasal spray Place 2 sprays into both nostrils as needed for rhinitis.    Marland Kitchen gabapentin (NEURONTIN) 600 MG tablet Take 600 mg by mouth 2 (two) times daily.    Marland Kitchen guaiFENesin (MUCINEX) 600 MG 12 hr tablet Take 600 mg by mouth 2 (two) times daily.    . insulin aspart (NOVOLOG) 100 UNIT/ML injection Inject 0-12 Units into the skin 4 (four) times daily -  before meals and at bedtime. Uses a sliding scale: 70-119 = 0 units, 120-150 = 2 units, 151-180 =  4 units, 181-210 = 5 units, 211-240 = 6 units, 241-270 = 8 units, 271-300 = 9 units, 301-330 = 10 units, 331-360 = 11 units, 361-400 = 12 units, greater than 400 notify MD.    . pyridostigmine (MESTINON) 60 MG tablet Take 60 mg by mouth 3 (three) times daily.    Marland Kitchen atorvastatin (LIPITOR) 80 MG tablet  Take 1 tablet (80 mg total) by mouth daily at 6 PM. 30 tablet 1  . busPIRone (BUSPAR) 10 MG tablet Take 1 tablet (10 mg total) by mouth 3 (three) times daily. 90 tablet 0  . furosemide (LASIX) 40 MG tablet Take 1 tablet (40 mg total) by mouth daily. 90 tablet 3  . insulin detemir (LEVEMIR) 100 UNIT/ML injection Inject 0.15 mLs (15 Units total) into the skin every morning. 10 mL 1  . metoprolol tartrate (LOPRESSOR) 25 MG tablet Take 0.5 tablets (12.5 mg total) by mouth 2 (two) times daily. 60 tablet 1  . pantoprazole (PROTONIX) 40 MG tablet Take 1 tablet (40 mg total) by mouth daily. 30 tablet 3  . potassium chloride (K-DUR) 10 MEQ tablet Take 1 tablet (10 mEq total) by mouth daily. [place]  tablet 3  . promethazine (PHENERGAN) 12.5 MG tablet Take 1 tablet (12.5 mg total) by mouth every 6 (six) hours as needed for nausea or vomiting. 30 tablet 0   No facility-administered medications prior to visit.     ROS Review of Systems  Constitutional: Negative for activity change, appetite change and fatigue.  HENT: Negative for congestion, sinus pressure and sore throat.   Eyes: Negative for visual disturbance.  Respiratory: Negative for cough, chest tightness, shortness of breath and wheezing.   Cardiovascular: Positive for leg swelling. Negative for chest pain and palpitations.  Gastrointestinal: Negative for abdominal distention, abdominal pain and constipation.  Endocrine: Negative for polydipsia.  Genitourinary: Negative for dysuria and frequency.  Musculoskeletal: Negative for arthralgias and back pain.  Skin: Negative for rash.  Neurological: Negative for tremors, light-headedness and numbness.  Hematological: Does not bruise/bleed easily.  Psychiatric/Behavioral: Negative for agitation and behavioral problems.    Objective:  BP 126/73   Pulse 77   Temp (!) 97.5 F (36.4 C) (Oral)   Ht 5\' 1"  (1.549 m)   Wt 135 lb 12.8 oz (61.6 kg)   SpO2 100%   BMI 25.66 kg/m   BP/Weight 03/20/2018  3/71/0626 02/26/8545  Systolic BP 270 94 350  Diastolic BP 73 52 82  Wt. (Lbs) 135.8 138 -  BMI 25.66 26.07 -      Physical Exam  Constitutional: She is oriented to person, place, and time. She appears well-developed and well-nourished.  HENT:  Right Ear: External ear normal.  Left Ear: External ear normal.  Mouth/Throat: Oropharynx is clear and moist.  Neck: No JVD present.  Cardiovascular: Normal rate, normal heart sounds and intact distal pulses.  No murmur heard. Pulmonary/Chest: Effort normal and breath sounds normal. She has no wheezes. She has no rales. She exhibits no tenderness.  Abdominal: Soft. Bowel sounds are normal. She exhibits no distension and no mass. There is no tenderness.  Musculoskeletal: Normal range of motion. She exhibits edema (1+ pitting right ankle edema).  Neurological: She is alert and oriented to person, place, and time.  Skin: Skin is warm and dry.  Psychiatric: She has a normal mood and affect.    Lab Results  Component Value Date   HGBA1C 8.2 (H) 02/19/2018    Assessment & Plan:   1. Diabetic neuropathy, type I diabetes mellitus ([place]) Uncontrolled with A1c of 8.2 which has improved from previous Counseled on Diabetic diet, my plate method, 093 minutes of moderate intensity exercise/week Keep appointment with Endocrine. Keep blood sugar logs with fasting goals of 80-120 mg/dl, random of less than 180 and in the event of sugars less than 60 mg/dl or greater than 400 mg/dl please notify the clinic ASAP. It is recommended that you undergo annual eye exams and annual foot exams. Pneumonia vaccine is recommended. - POCT glucose (manual entry) - insulin detemir (LEVEMIR) 100 UNIT/ML injection; Inject 0.15 mLs (15 Units total) into the skin every morning.  Dispense: 10 mL; Refill: 1 - Insulin Syringe-Needle U-100 (BD INSULIN SYRINGE ULTRAFINE) 31G X 15/64" 0.5 ML MISC; 1 each by Does not apply route 4 (four) times daily.  Dispense: 120 each; Refill:  5  2. Diabetic gastroparesis ([place]) Stable; no exacaerbation at this time - promethazine (PHENERGAN) 12.5 MG tablet; Take 1 tablet (12.5 mg total) by mouth every 6 (six) hours as needed for nausea or vomiting.  Dispense: 30 tablet; Refill: 1 - pantoprazole (PROTONIX) 40 MG tablet; Take 1 tablet (40 mg total) by mouth daily.  Dispense: 30 tablet; Refill: 3 - Basic Metabolic Panel  3. Anxiety and depression Controlled - busPIRone (BUSPAR) 10 MG tablet; Take 1 tablet (10 mg total) by mouth 3 (three) times daily.  Dispense: 90 tablet; Refill: 3  4. Essential hypertension Controlled Counseled on blood pressure goal of less than 130/80, low-sodium, DASH diet, medication compliance, 150 minutes of moderate intensity exercise per week. Discussed medication compliance, adverse effects. - metoprolol tartrate (LOPRESSOR) 25 MG tablet; Take 0.5 tablets (12.5 mg total) by mouth 2 (two) times daily.  Dispense: 60 tablet; Refill: 3  5. S/P CABG x 3 Risk factor modifications Recently seen by cardiology  6. Pedal edema She has been taking 20 mg rather than 40mg  prescribed by Cardiology and has been advised to increase dose Low sodium diet, elevate feet - furosemide (LASIX) 40 MG tablet; Take 1 tablet (40 mg total) by mouth daily.  Dispense: 30 tablet; Refill: 3 - potassium chloride (K-DUR) 10 MEQ tablet; Take 1 tablet (10 mEq total) by mouth daily.  Dispense: 30 tablet; Refill: 3   Meds ordered this encounter  Medications  . insulin detemir (LEVEMIR) 100 UNIT/ML injection    Sig: Inject 0.15 mLs (15 Units total) into the skin every morning.    Dispense:  10 mL    Refill:  1  . Insulin Syringe-Needle U-100 (BD INSULIN SYRINGE ULTRAFINE) 31G X 15/64" 0.5 ML MISC    Sig: 1 each by Does not apply route 4 (four) times daily.    Dispense:  120 each    Refill:  5  . promethazine (PHENERGAN) 12.5 MG tablet    Sig: Take 1 tablet (12.5 mg total) by mouth every 6 (six) hours as needed for nausea or  vomiting.    Dispense:  30 tablet    Refill:  1  . pantoprazole (PROTONIX) 40 MG tablet    Sig: Take 1 tablet (40 mg total) by mouth daily.    Dispense:  30 tablet    Refill:  3  . metoprolol tartrate (LOPRESSOR) 25 MG tablet    Sig: Take 0.5 tablets (12.5 mg total) by mouth 2 (two) times daily.    Dispense:  60 tablet    Refill:  3  . busPIRone (BUSPAR) 10 MG tablet    Sig: Take 1 tablet (10 mg total) by mouth 3 (three) times daily.    Dispense:  90 tablet    Refill:  3  . atorvastatin (LIPITOR) 80 MG tablet    Sig: Take 1 tablet (80 mg total) by mouth daily at 6 PM.    Dispense:  30 tablet    Refill:  3  . furosemide (LASIX) 40 MG tablet    Sig: Take 1 tablet (40 mg total) by mouth daily.    Dispense:  30 tablet    Refill:  3  . potassium chloride (K-DUR) 10 MEQ tablet    Sig: Take 1 tablet (10 mEq total) by mouth daily.    Dispense:  30 tablet    Refill:  3    Follow-up: Return in about 3 months (around 06/19/2018) for Follow-up of chronic medical conditions.   [name] MD

## 2018-03-21 ENCOUNTER — Other Ambulatory Visit: Payer: Self-pay

## 2018-03-21 ENCOUNTER — Telehealth: Payer: Self-pay

## 2018-03-21 ENCOUNTER — Encounter: Payer: Self-pay | Admitting: Family Medicine

## 2018-03-21 LAB — BASIC METABOLIC PANEL
BUN/Creatinine Ratio: 13 (ref 9–23)
BUN: 13 mg/dL (ref 6–24)
CALCIUM: 9.7 mg/dL (ref 8.7–10.2)
CHLORIDE: 100 mmol/L (ref 96–106)
CO2: 23 mmol/L (ref 20–29)
Creatinine, Ser: 0.99 mg/dL (ref 0.57–1.00)
GFR calc non Af Amer: 67 mL/min/{1.73_m2} (ref >59)
GFR, EST AFRICAN AMERICAN: 77 mL/min/{1.73_m2} (ref >59)
GLUCOSE: 117 mg/dL — AB (ref 65–99)
Potassium: 4.3 mmol/L (ref 3.5–5.2)
Sodium: 138 mmol/L (ref 134–144)

## 2018-03-21 MED ORDER — ONETOUCH DELICA PLUS LANCET33G MISC: 1.0000 [IU] | 11 refills | Status: DC

## 2018-03-21 MED ORDER — GLUCOSE BLOOD VI STRP: ORAL_STRIP | 12 refills | Status: DC

## 2018-03-21 MED ORDER — ONETOUCH VERIO W/DEVICE KIT: PACK | 0 refills | Status: DC

## 2018-03-21 NOTE — BH Specialist Note (Signed)
Pt accompanied case manager into exam room. MSW Intern provided introduction to pt on behavioral health services if pt ever needed them. Pt stated that she needed information on transportation. Pt stated that she currently receives [employer] but she will no longer be residing inn [place], but still in [place]. MSW intern provided pt with non-medicaid recipient transportation application. Case manager assisted pt with housing resources. Pt will still be moving with friend but desires to live alone.   No IBH assessment was needed.  Dennison Mascot, MSW Intern 03/21/18, 11:47 AM

## 2018-03-21 NOTE — Telephone Encounter (Signed)
Patient was called and informed of lab results. 

## 2018-03-21 NOTE — Telephone Encounter (Signed)
Patients diabetic meter strips and lancets must be sent to an outside pharmacy so she can utilize her medicare part b benefits, our pharmacy confirmed that patient would like them sent to [facility] [address]

## 2018-03-21 NOTE — Telephone Encounter (Signed)
-----   Message from [name], MD sent at 03/21/2018  9:55 AM EDT ----- Labs are stable

## 2018-03-24 ENCOUNTER — Telehealth: Payer: Self-pay

## 2018-03-24 NOTE — Telephone Encounter (Signed)
Transportation application faxed to [employer].

## 2018-03-25 ENCOUNTER — Telehealth: Payer: Self-pay

## 2018-03-25 NOTE — Telephone Encounter (Signed)
Call received from [facility] with [facility]. She confirmed that she received the entire transportation referral and will follow up with the patient.

## 2018-03-26 ENCOUNTER — Telehealth: Payer: Self-pay

## 2018-03-26 NOTE — Telephone Encounter (Signed)
Call received from patient. She wanted the phone # for [facility] to follow up on her application. Provided her with that number.  She said that she is feeling ok, just tired from trying to pack and get ready to move this weekend.

## 2018-03-27 ENCOUNTER — Telehealth: Payer: Self-pay

## 2018-03-27 NOTE — Telephone Encounter (Signed)
Call received from patient. She said that she called DSS transportation and they told her that they do not have a record of her transportation application.  She said that she spoke to [place].   This CM called [facility] # [phone], spoke to [place] who this CM spoke to on 03/25/18.  Olivia Mackie said that if she confirmed receipt of the referral it has been sent to the supervisor for review and there would not be a record of the application in the system yet.  Olivia Mackie said that she would email her supervisor to confirm receipt of the referral again. Provided her with CM call back # [phone] if she has any questions. Olivia Mackie also said that the number that patient called probably went to the call center , not transportation.    Call placed to the patient and informed her of conversation with Linus Orn also provided her with the above phone # for [facility] transportation if she has any questions.

## 2018-03-28 IMAGING — DX DG CHEST 1V PORT
1 series · 1 of 1 positions shown · non-contrast
Comparison: Radiograph 03/25/2017

CLINICAL DATA: Nausea and vomiting for 2 days.

EXAM:
PORTABLE CHEST 1 VIEW

[chest ap]
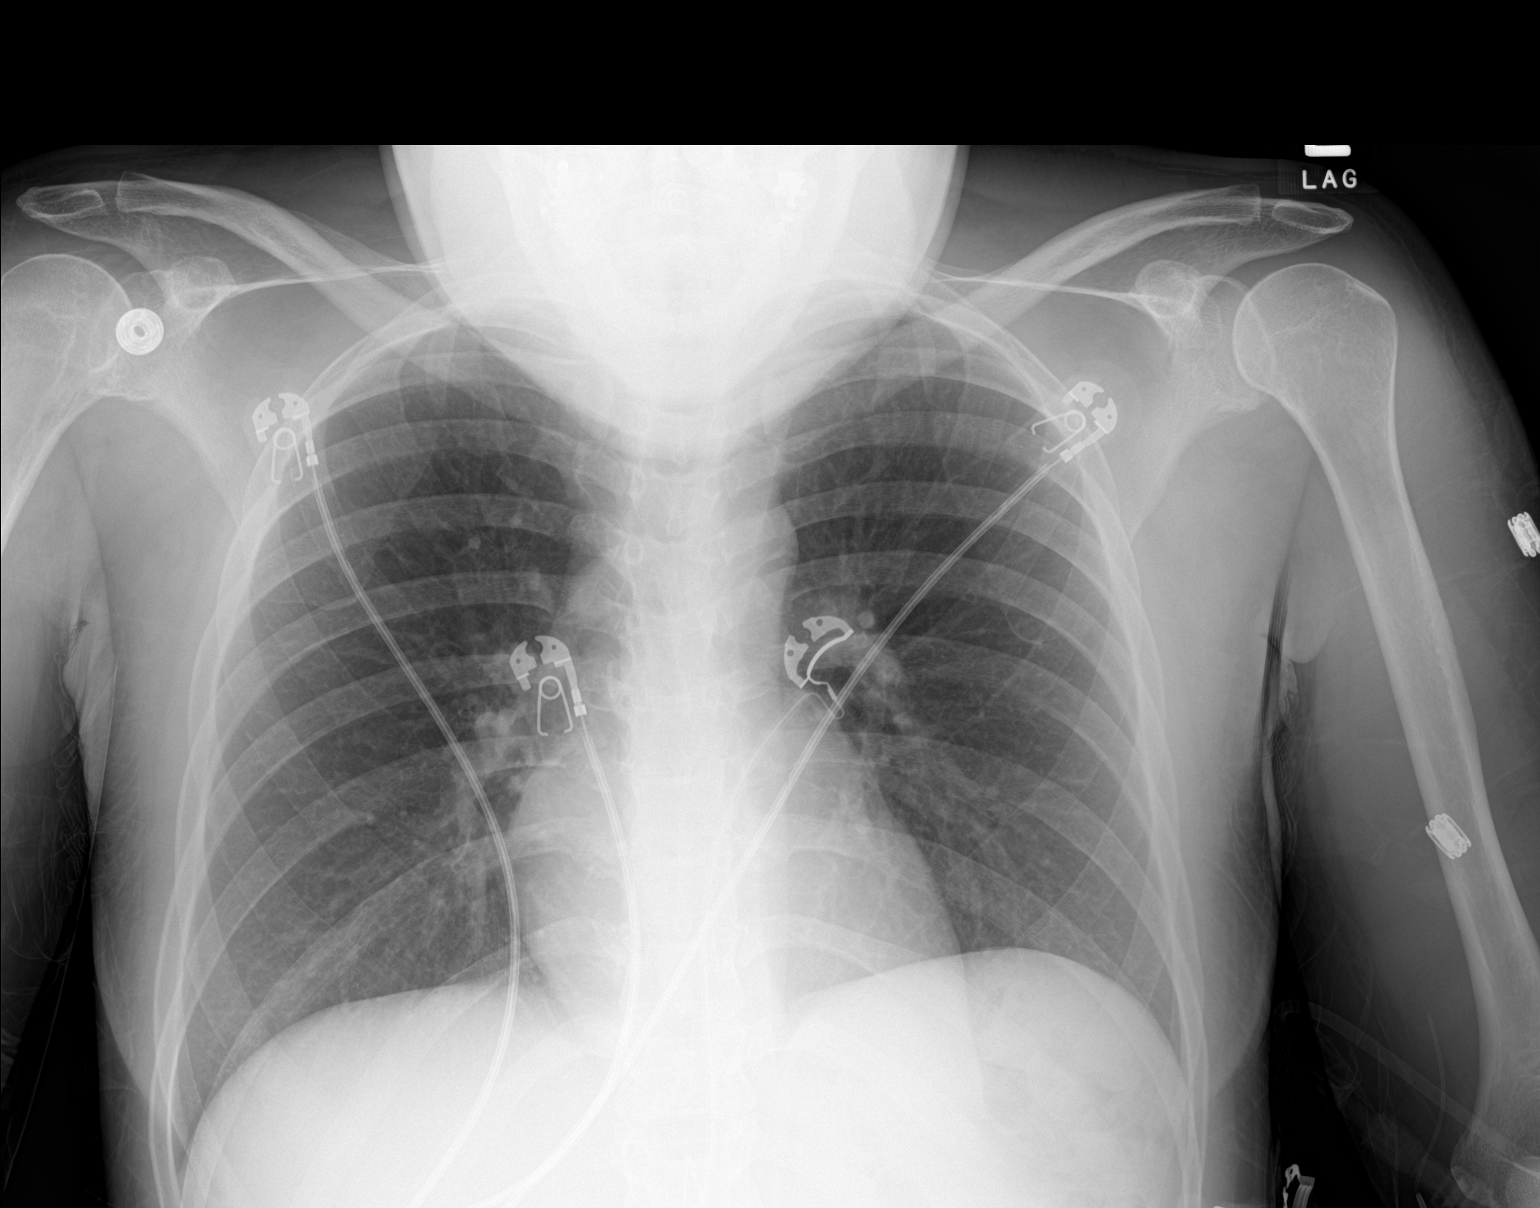

[1 of 1 positions shown; findings below may reference images not displayed]

FINDINGS: Patient's chain obscures both apices. The cardiomediastinal contours
are normal. The lungs are clear. Pulmonary vasculature is normal. No
consolidation, pleural effusion, or pneumothorax. No acute osseous
abnormalities are seen.
IMPRESSION: No acute abnormality.

## 2018-03-28 IMAGING — DX DG ABD PORTABLE 2V
2 series · 2 of 2 positions shown · non-contrast
Comparison: Radiographs 03/30/2017

CLINICAL DATA: Nausea and vomiting for 2 days.

EXAM:
PORTABLE ABDOMEN - 2 VIEW

[abdomen kub (1 of 2)]
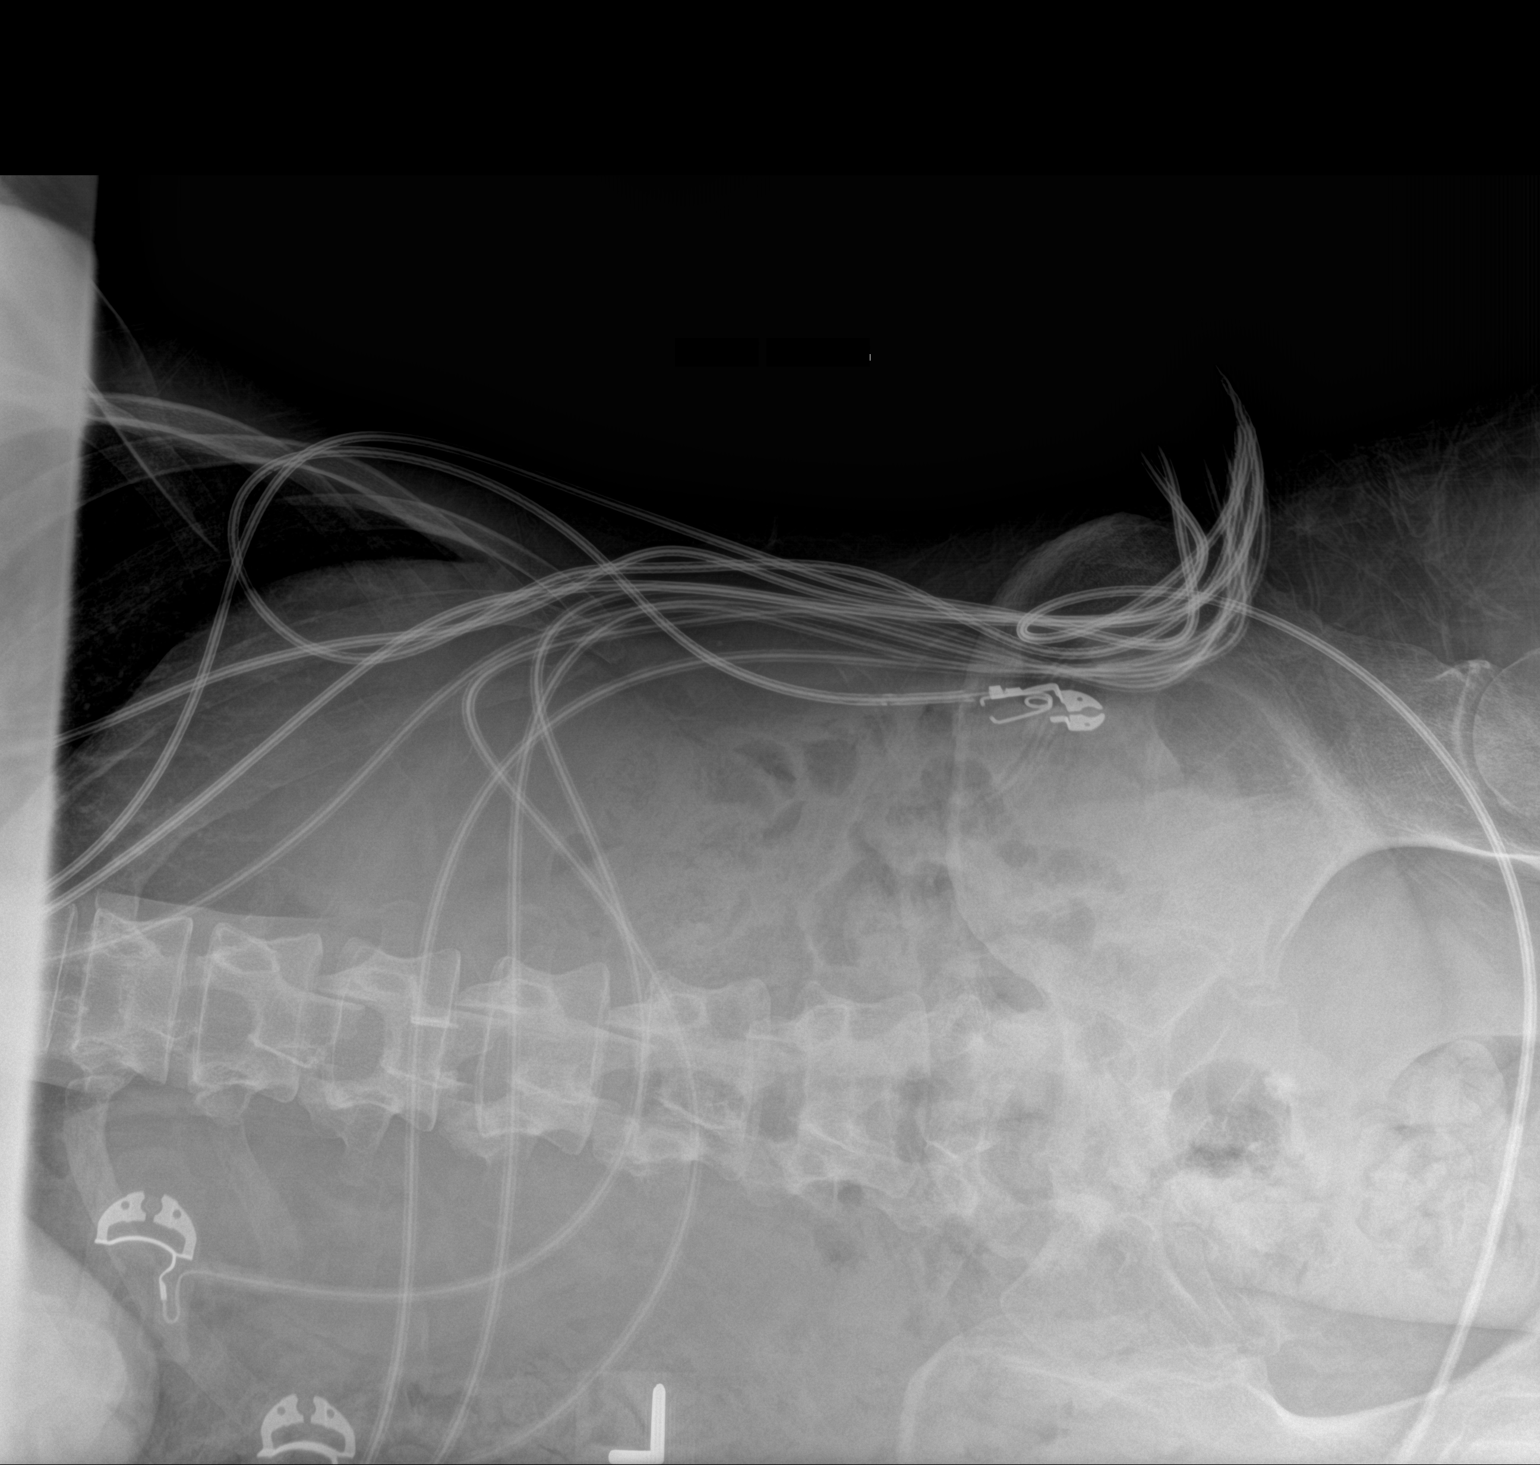

[abdomen kub (2 of 2)]
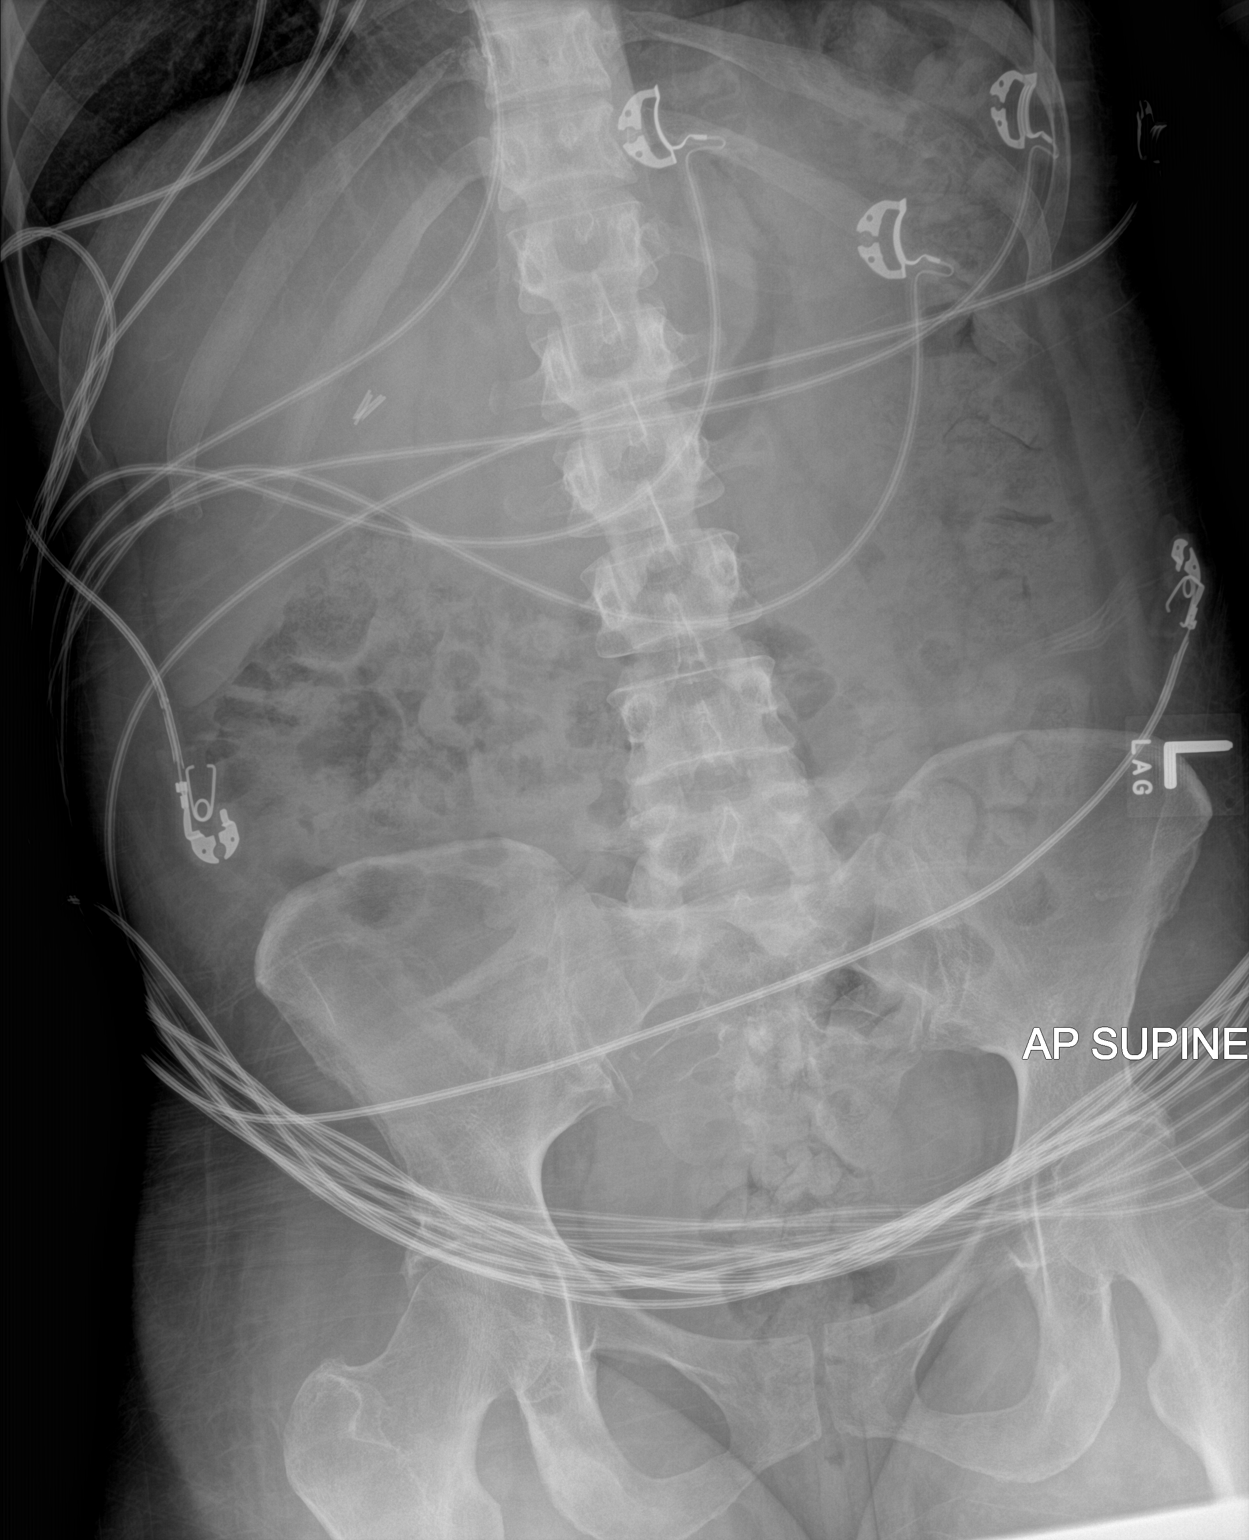

[2 of 2 positions shown; findings below may reference images not displayed]

FINDINGS: No bowel dilatation to suggest obstruction. No evidence of free air,
overlying monitoring devices partially limit evaluation. Moderate to
large colonic stool burden. No abnormal rectal distention.
Cholecystectomy clips in the right upper quadrant. No abnormal soft
tissue calcifications.
IMPRESSION: Moderate to large colonic stool burden suggesting constipation. No
bowel obstruction or evidence of free air.

## 2018-03-31 ENCOUNTER — Observation Stay (HOSPITAL_COMMUNITY)
Admission: EM | Admit: 2018-03-31 | Discharge: 2018-04-01 | Disposition: A | Discharge status: Home / Self Care | Payer: Medicare Other | Attending: Internal Medicine | Admitting: Internal Medicine

## 2018-03-31 ENCOUNTER — Encounter (HOSPITAL_COMMUNITY): Payer: Self-pay | Admitting: *Deleted

## 2018-03-31 ENCOUNTER — Other Ambulatory Visit: Payer: Self-pay

## 2018-03-31 DIAGNOSIS — R1084 Generalized abdominal pain: Secondary | ICD-10-CM | POA: Diagnosis not present

## 2018-03-31 DIAGNOSIS — Z951 Presence of aortocoronary bypass graft: Secondary | ICD-10-CM | POA: Diagnosis not present

## 2018-03-31 DIAGNOSIS — I252 Old myocardial infarction: Secondary | ICD-10-CM | POA: Insufficient documentation

## 2018-03-31 DIAGNOSIS — Z9114 Patient's other noncompliance with medication regimen: Secondary | ICD-10-CM | POA: Diagnosis not present

## 2018-03-31 DIAGNOSIS — Z7982 Long term (current) use of aspirin: Secondary | ICD-10-CM | POA: Diagnosis not present

## 2018-03-31 DIAGNOSIS — F419 Anxiety disorder, unspecified: Secondary | ICD-10-CM | POA: Insufficient documentation

## 2018-03-31 DIAGNOSIS — E0810 Diabetes mellitus due to underlying condition with ketoacidosis without coma: Secondary | ICD-10-CM | POA: Diagnosis not present

## 2018-03-31 DIAGNOSIS — E785 Hyperlipidemia, unspecified: Secondary | ICD-10-CM | POA: Insufficient documentation

## 2018-03-31 DIAGNOSIS — R000 Tachycardia, unspecified: Secondary | ICD-10-CM | POA: Diagnosis not present

## 2018-03-31 DIAGNOSIS — K3184 Gastroparesis: Secondary | ICD-10-CM | POA: Insufficient documentation

## 2018-03-31 DIAGNOSIS — E1110 Type 2 diabetes mellitus with ketoacidosis without coma: Principal | ICD-10-CM | POA: Insufficient documentation

## 2018-03-31 DIAGNOSIS — F32A Depression, unspecified: Secondary | ICD-10-CM | POA: Diagnosis present

## 2018-03-31 DIAGNOSIS — N179 Acute kidney failure, unspecified: Secondary | ICD-10-CM | POA: Insufficient documentation

## 2018-03-31 DIAGNOSIS — E1143 Type 2 diabetes mellitus with diabetic autonomic (poly)neuropathy: Secondary | ICD-10-CM | POA: Diagnosis present

## 2018-03-31 DIAGNOSIS — R0902 Hypoxemia: Secondary | ICD-10-CM | POA: Diagnosis not present

## 2018-03-31 DIAGNOSIS — E1140 Type 2 diabetes mellitus with diabetic neuropathy, unspecified: Secondary | ICD-10-CM | POA: Diagnosis not present

## 2018-03-31 DIAGNOSIS — I2510 Atherosclerotic heart disease of native coronary artery without angina pectoris: Secondary | ICD-10-CM | POA: Insufficient documentation

## 2018-03-31 DIAGNOSIS — T383X6A Underdosing of insulin and oral hypoglycemic [antidiabetic] drugs, initial encounter: Secondary | ICD-10-CM | POA: Diagnosis not present

## 2018-03-31 DIAGNOSIS — E1310 Other specified diabetes mellitus with ketoacidosis without coma: Secondary | ICD-10-CM | POA: Diagnosis not present

## 2018-03-31 DIAGNOSIS — E860 Dehydration: Secondary | ICD-10-CM | POA: Diagnosis not present

## 2018-03-31 DIAGNOSIS — Z79899 Other long term (current) drug therapy: Secondary | ICD-10-CM | POA: Diagnosis not present

## 2018-03-31 DIAGNOSIS — K219 Gastro-esophageal reflux disease without esophagitis: Secondary | ICD-10-CM | POA: Insufficient documentation

## 2018-03-31 DIAGNOSIS — I10 Essential (primary) hypertension: Secondary | ICD-10-CM | POA: Diagnosis not present

## 2018-03-31 DIAGNOSIS — Z794 Long term (current) use of insulin: Secondary | ICD-10-CM | POA: Diagnosis not present

## 2018-03-31 DIAGNOSIS — R0689 Other abnormalities of breathing: Secondary | ICD-10-CM | POA: Diagnosis not present

## 2018-03-31 DIAGNOSIS — F329 Major depressive disorder, single episode, unspecified: Secondary | ICD-10-CM | POA: Diagnosis not present

## 2018-03-31 DIAGNOSIS — D509 Iron deficiency anemia, unspecified: Secondary | ICD-10-CM | POA: Insufficient documentation

## 2018-03-31 DIAGNOSIS — R52 Pain, unspecified: Secondary | ICD-10-CM | POA: Diagnosis not present

## 2018-03-31 DIAGNOSIS — E11319 Type 2 diabetes mellitus with unspecified diabetic retinopathy without macular edema: Secondary | ICD-10-CM | POA: Diagnosis not present

## 2018-03-31 DIAGNOSIS — E1165 Type 2 diabetes mellitus with hyperglycemia: Secondary | ICD-10-CM | POA: Diagnosis not present

## 2018-03-31 LAB — BASIC METABOLIC PANEL
ANION GAP: 11 (ref 5–15)
ANION GAP: 9 (ref 5–15)
BUN: 25 mg/dL — ABNORMAL HIGH (ref 6–20)
BUN: 27 mg/dL — ABNORMAL HIGH (ref 6–20)
CALCIUM: 8.1 mg/dL — AB (ref 8.9–10.3)
CHLORIDE: 114 mmol/L — AB (ref 98–111)
CO2: 15 mmol/L — AB (ref 22–32)
CO2: 19 mmol/L — AB (ref 22–32)
CREATININE: 1.7 mg/dL — AB (ref 0.44–1.00)
Calcium: 9 mg/dL (ref 8.9–10.3)
Chloride: 113 mmol/L — ABNORMAL HIGH (ref 98–111)
Creatinine, Ser: 1.5 mg/dL — ABNORMAL HIGH (ref 0.44–1.00)
GFR calc Af Amer: 46 mL/min — ABNORMAL LOW (ref >60)
GFR calc non Af Amer: 40 mL/min — ABNORMAL LOW (ref >60)
GFR calc non Af Amer: 34 mL/min — ABNORMAL LOW (ref >60)
GFR, EST AFRICAN AMERICAN: 39 mL/min — AB (ref >60)
GLUCOSE: 273 mg/dL — AB (ref 70–99)
Glucose, Bld: 334 mg/dL — ABNORMAL HIGH (ref 70–99)
POTASSIUM: 3.8 mmol/L (ref 3.5–5.1)
Potassium: 3.9 mmol/L (ref 3.5–5.1)
SODIUM: 140 mmol/L (ref 135–145)
Sodium: 141 mmol/L (ref 135–145)

## 2018-03-31 LAB — CBC WITH DIFFERENTIAL/PLATELET
Abs Immature Granulocytes: 0.1 10*3/uL (ref 0.0–0.1)
BASOS ABS: 0.1 10*3/uL (ref 0.0–0.1)
Basophils Relative: 0 %
EOS ABS: 0.1 10*3/uL (ref 0.0–0.7)
Eosinophils Relative: 1 %
HEMATOCRIT: 47 % — AB (ref 36.0–46.0)
HEMOGLOBIN: 14.2 g/dL (ref 12.0–15.0)
Immature Granulocytes: 1 %
LYMPHS PCT: 5 %
Lymphs Abs: 0.9 10*3/uL (ref 0.7–4.0)
MCH: 28.8 pg (ref 26.0–34.0)
MCHC: 30.2 g/dL (ref 30.0–36.0)
MCV: 95.3 fL (ref 78.0–100.0)
MONO ABS: 0.9 10*3/uL (ref 0.1–1.0)
Monocytes Relative: 5 %
Neutro Abs: 15.5 10*3/uL — ABNORMAL HIGH (ref 1.7–7.7)
Neutrophils Relative %: 88 %
Platelets: 289 10*3/uL (ref 150–400)
RBC: 4.93 MIL/uL (ref 3.87–5.11)
RDW: 13.8 % (ref 11.5–15.5)
WBC: 17.6 10*3/uL — ABNORMAL HIGH (ref 4.0–10.5)

## 2018-03-31 LAB — I-STAT ARTERIAL BLOOD GAS, ED
Acid-base deficit: 10 mmol/L — ABNORMAL HIGH (ref 0.0–2.0)
BICARBONATE: 14.3 mmol/L — AB (ref 20.0–28.0)
O2 Saturation: 98 %
PO2 ART: 107 mmHg (ref 83.0–108.0)
Patient temperature: 97.9
TCO2: 15 mmol/L — ABNORMAL LOW (ref 22–32)
pCO2 arterial: 26 mmHg — ABNORMAL LOW (ref 32.0–48.0)
pH, Arterial: 7.346 — ABNORMAL LOW (ref 7.350–7.450)

## 2018-03-31 LAB — I-STAT VENOUS BLOOD GAS, ED
ACID-BASE DEFICIT: 9 mmol/L — AB (ref 0.0–2.0)
BICARBONATE: 16.2 mmol/L — AB (ref 20.0–28.0)
O2 Saturation: 90 %
PO2 VEN: 64 mmHg — AB (ref 32.0–45.0)
TCO2: 17 mmol/L — ABNORMAL LOW (ref 22–32)
pCO2, Ven: 34.2 mmHg — ABNORMAL LOW (ref 44.0–60.0)
pH, Ven: 7.284 (ref 7.250–7.430)

## 2018-03-31 LAB — CBG MONITORING, ED
GLUCOSE-CAPILLARY: 490 mg/dL — AB (ref 70–99)
GLUCOSE-CAPILLARY: 510 mg/dL — AB (ref 70–99)
Glucose-Capillary: 446 mg/dL — ABNORMAL HIGH (ref 70–99)
Glucose-Capillary: 417 mg/dL — ABNORMAL HIGH (ref 70–99)
Glucose-Capillary: 330 mg/dL — ABNORMAL HIGH (ref 70–99)
Glucose-Capillary: 313 mg/dL — ABNORMAL HIGH (ref 70–99)

## 2018-03-31 LAB — GLUCOSE, CAPILLARY
GLUCOSE-CAPILLARY: 314 mg/dL — AB (ref 70–99)
GLUCOSE-CAPILLARY: 249 mg/dL — AB (ref 70–99)
Glucose-Capillary: 152 mg/dL — ABNORMAL HIGH (ref 70–99)
Glucose-Capillary: 180 mg/dL — ABNORMAL HIGH (ref 70–99)
Glucose-Capillary: 220 mg/dL — ABNORMAL HIGH (ref 70–99)

## 2018-03-31 LAB — I-STAT CG4 LACTIC ACID, ED
LACTIC ACID, VENOUS: 6.51 mmol/L — AB (ref 0.5–1.9)
LACTIC ACID, VENOUS: 3.04 mmol/L — AB (ref 0.5–1.9)

## 2018-03-31 LAB — COMPREHENSIVE METABOLIC PANEL
ALBUMIN: 4.3 g/dL (ref 3.5–5.0)
ALK PHOS: 108 U/L (ref 38–126)
ALT: 34 U/L (ref 0–44)
AST: 33 U/L (ref 15–41)
Anion gap: 22 — ABNORMAL HIGH (ref 5–15)
BILIRUBIN TOTAL: 2.8 mg/dL — AB (ref 0.3–1.2)
BUN: 24 mg/dL — ABNORMAL HIGH (ref 6–20)
CO2: 15 mmol/L — ABNORMAL LOW (ref 22–32)
Calcium: 9.9 mg/dL (ref 8.9–10.3)
Chloride: 101 mmol/L (ref 98–111)
Creatinine, Ser: 1.58 mg/dL — ABNORMAL HIGH (ref 0.44–1.00)
GFR calc Af Amer: 43 mL/min — ABNORMAL LOW (ref >60)
GFR calc non Af Amer: 37 mL/min — ABNORMAL LOW (ref >60)
GLUCOSE: 486 mg/dL — AB (ref 70–99)
POTASSIUM: 4.5 mmol/L (ref 3.5–5.1)
Sodium: 138 mmol/L (ref 135–145)
TOTAL PROTEIN: 8.1 g/dL (ref 6.5–8.1)

## 2018-03-31 LAB — I-STAT TROPONIN, ED: Troponin i, poc: 0 ng/mL (ref 0.00–0.08)

## 2018-03-31 LAB — I-STAT BETA HCG BLOOD, ED (MC, WL, AP ONLY): I-stat hCG, quantitative: <5 m[IU]/mL (ref <5)

## 2018-03-31 LAB — LACTIC ACID, PLASMA: LACTIC ACID, VENOUS: 1.9 mmol/L (ref 0.5–1.9)

## 2018-03-31 MED ORDER — POTASSIUM CHLORIDE 10 MEQ/100ML IV SOLN
10.0000 meq | INTRAVENOUS | Status: AC
  Administered 2018-03-31 (×2): 10 meq via INTRAVENOUS
  Filled 2018-03-31 (×2): qty 100

## 2018-03-31 MED ORDER — SODIUM CHLORIDE 0.9 % IV SOLN: INTRAVENOUS | Status: DC

## 2018-03-31 MED ORDER — ASPIRIN EC 325 MG PO TBEC
325.0000 mg | DELAYED_RELEASE_TABLET | Freq: Every day | ORAL | Status: DC
  Administered 2018-04-01: 325 mg via ORAL
  Filled 2018-03-31: qty 1

## 2018-03-31 MED ORDER — PYRIDOSTIGMINE BROMIDE 60 MG PO TABS
60.0000 mg | ORAL_TABLET | Freq: Three times a day (TID) | ORAL | Status: DC
  Administered 2018-04-01 (×2): 60 mg via ORAL
  Filled 2018-03-31 (×2): qty 1

## 2018-03-31 MED ORDER — ATORVASTATIN CALCIUM 20 MG PO TABS
80.0000 mg | ORAL_TABLET | Freq: Every day | ORAL | Status: DC
  Administered 2018-04-01: 80 mg via ORAL
  Filled 2018-03-31: qty 4

## 2018-03-31 MED ORDER — SODIUM CHLORIDE 0.9 % IV SOLN
INTRAVENOUS | Status: DC
  Administered 2018-03-31: 7.6 [IU]/h via INTRAVENOUS
  Filled 2018-03-31: qty 1

## 2018-03-31 MED ORDER — PROCHLORPERAZINE EDISYLATE 10 MG/2ML IJ SOLN
10.0000 mg | INTRAMUSCULAR | Status: DC | PRN
  Administered 2018-03-31: 10 mg via INTRAVENOUS
  Filled 2018-03-31: qty 2

## 2018-03-31 MED ORDER — SODIUM CHLORIDE 0.9 % IV SOLN
INTRAVENOUS | Status: DC
  Administered 2018-03-31 – 2018-04-01 (×2): via INTRAVENOUS

## 2018-03-31 MED ORDER — SODIUM CHLORIDE 0.9 % IV SOLN
INTRAVENOUS | Status: DC
  Administered 2018-03-31: 4.5 [IU]/h via INTRAVENOUS
  Filled 2018-03-31: qty 1

## 2018-03-31 MED ORDER — PROMETHAZINE HCL 25 MG/ML IJ SOLN: 12.5000 mg | INTRAMUSCULAR | Status: DC | PRN

## 2018-03-31 MED ORDER — SODIUM CHLORIDE 0.9 % IV SOLN
INTRAVENOUS | Status: AC
  Administered 2018-03-31: 21:00:00 via INTRAVENOUS

## 2018-03-31 MED ORDER — ENOXAPARIN SODIUM 40 MG/0.4ML ~~LOC~~ SOLN
40.0000 mg | SUBCUTANEOUS | Status: DC
  Administered 2018-03-31: 40 mg via SUBCUTANEOUS
  Filled 2018-03-31: qty 0.4

## 2018-03-31 MED ORDER — BUSPIRONE HCL 10 MG PO TABS
10.0000 mg | ORAL_TABLET | Freq: Three times a day (TID) | ORAL | Status: DC
  Administered 2018-04-01 (×2): 10 mg via ORAL
  Filled 2018-03-31 (×2): qty 2
  Filled 2018-03-31 (×4): qty 1

## 2018-03-31 MED ORDER — GABAPENTIN 600 MG PO TABS
600.0000 mg | ORAL_TABLET | Freq: Two times a day (BID) | ORAL | Status: DC
  Administered 2018-04-01: 600 mg via ORAL
  Filled 2018-03-31: qty 1

## 2018-03-31 MED ORDER — PROMETHAZINE HCL 25 MG/ML IJ SOLN
25.0000 mg | Freq: Once | INTRAMUSCULAR | Status: AC
  Administered 2018-03-31: 25 mg via INTRAMUSCULAR
  Filled 2018-03-31: qty 1

## 2018-03-31 MED ORDER — DEXTROSE-NACL 5-0.45 % IV SOLN: INTRAVENOUS | Status: DC

## 2018-03-31 MED ORDER — GUAIFENESIN ER 600 MG PO TB12
600.0000 mg | ORAL_TABLET | Freq: Two times a day (BID) | ORAL | Status: DC
  Administered 2018-04-01: 600 mg via ORAL
  Filled 2018-03-31: qty 1

## 2018-03-31 MED ORDER — METOPROLOL TARTRATE 25 MG PO TABS
12.5000 mg | ORAL_TABLET | Freq: Two times a day (BID) | ORAL | Status: DC
  Administered 2018-04-01: 12.5 mg via ORAL
  Filled 2018-03-31: qty 1

## 2018-03-31 MED ORDER — PANTOPRAZOLE SODIUM 40 MG PO TBEC
40.0000 mg | DELAYED_RELEASE_TABLET | Freq: Every day | ORAL | Status: DC
  Administered 2018-04-01: 40 mg via ORAL
  Filled 2018-03-31: qty 1

## 2018-03-31 MED ORDER — PROCHLORPERAZINE EDISYLATE 10 MG/2ML IJ SOLN
10.0000 mg | Freq: Once | INTRAMUSCULAR | Status: AC
  Administered 2018-03-31: 10 mg via INTRAVENOUS
  Filled 2018-03-31: qty 2

## 2018-03-31 MED ORDER — DEXTROSE-NACL 5-0.45 % IV SOLN
INTRAVENOUS | Status: DC
  Administered 2018-03-31 – 2018-04-01 (×2): via INTRAVENOUS

## 2018-03-31 MED ORDER — SODIUM CHLORIDE 0.9% FLUSH
10.0000 mL | INTRAVENOUS | Status: DC | PRN
  Administered 2018-04-01: 10 mL
  Filled 2018-03-31: qty 40

## 2018-03-31 MED ORDER — SODIUM CHLORIDE 0.9 % IV BOLUS
3000.0000 mL | Freq: Once | INTRAVENOUS | Status: AC
  Administered 2018-03-31: 1000 mL via INTRAVENOUS

## 2018-03-31 MED ORDER — HALOPERIDOL LACTATE 5 MG/ML IJ SOLN
5.0000 mg | Freq: Once | INTRAMUSCULAR | Status: AC
  Administered 2018-03-31: 5 mg via INTRAMUSCULAR
  Filled 2018-03-31: qty 1

## 2018-03-31 MED ORDER — INSULIN DETEMIR 100 UNIT/ML ~~LOC~~ SOLN
15.0000 [IU] | Freq: Once | SUBCUTANEOUS | Status: AC
  Administered 2018-03-31: 15 [IU] via SUBCUTANEOUS
  Filled 2018-03-31: qty 0.15

## 2018-03-31 NOTE — H&P (Signed)
History and Physical    [name] FYB:[id number] DOB: 12-Aug-1965 DOA: 03/31/2018  PCP: [name], MD  Patient coming from:Home - lives with sister  Chief Complaint: Hyperglycemia  HPI: [name] is a 52 y.o. female with medical history significant of CAD s/p CABG 7/19; HTN; HLD; MDD with tardive dyskinesia; and poorly controlled DM with multiple prior admissions for DKA with diabetic complications including neuropathy, gastroparesis, and retinopathy presenting with hyperglycemia. She reports that symptoms started last night.  She is too somnolent to be able to answer additional questions at this time.  HPI per Dr. Rex Kras:   52yo F w/ PMH including IDDM, diabetic gastroparesis, diabetic neuropathy, DKA, CAD s/p CABG who presents with vomiting and abdominal pain. Vision states she began having nausea and vomiting associated with hyperglycemia last night. This morning she began having abdominal pain similar to previous episodes due to gastroparesis. She reports feeling anxious. She denies any fevers or sore throat. She took her medications last night but has not had insulin this morning.   ED Course:  Frequently here for DKA, gastroparesis.  Presented with the same - N/V since last night, abdominal pain this AM.  Diaphoretic, retching on presentation.  Poor IV access - has a femoral line now.  Likely needs a port.  Gap 22.  Lactate 6.5.  Glucose 500.  PCCM consulted, ok with SDU admission if lactate downtrending and repeat was 3.  On DKA protocol, insulin drip, on K+ repletion.  Review of Systems: Unable to perform  PMH, PSH, FH, and SH were reviewed in Epic   Past Medical History:  Diagnosis Date  . AKI (acute kidney injury) ([place])   . Anemia, iron deficiency   . Anxiety and depression 05/18/2015  . CAD (coronary artery disease), native coronary artery 01/11/2018   Non-STEMI with severe three-vessel disease noted July 2019  . Depression   . Diabetes mellitus without  complication ([place])   . Diabetic gastroparesis ([place])   . Diabetic neuropathy, type I diabetes mellitus ([place]) 05/18/2015  . DKA (diabetic ketoacidoses) ([place]) 06/16/2017  . DKA, type 1 ([place]) 03/18/2016  . Essential hypertension   . Gastroparesis   . GERD (gastroesophageal reflux disease)   . HLD (hyperlipidemia)   . MDD (major depressive disorder), single episode, severe , no psychosis ([place])   . Non-ST elevation (NSTEMI) myocardial infarction ([place])   . S/P CABG x 3 01/14/2018  . Tardive dyskinesia   . Vitamin B12 deficiency 08/16/2015    Past Surgical History:  Procedure Laterality Date  . CARDIAC SURGERY    . COLONOSCOPY  09/27/2014   at [facility]  . CORONARY ARTERY BYPASS GRAFT N/A 01/14/2018   Procedure: CORONARY ARTERY BYPASS GRAFTING (CABG)X3, RIGHT AND LEFT SAPHENOUS VEIN HARVEST, MAMMARY TAKE DOWN. MAMMARY TO LAD, SVG TO PD, SVG TO DISTAL CIRC.;  Surgeon: Grace Isaac, MD;  Location: [facility];  Service: Open Heart Surgery;  Laterality: N/A;  . ESOPHAGOGASTRODUODENOSCOPY  09/27/2014   at [facility], Dr Rolan Lipa. biospy neg for celiac, neg for H pylori.   . EYE SURGERY    . gailstones    . IR FLUORO GUIDE CV LINE RIGHT  02/01/2017  . IR FLUORO GUIDE CV LINE RIGHT  03/06/2017  . IR FLUORO GUIDE CV LINE RIGHT  03/25/2017  . IR GENERIC HISTORICAL  01/24/2016   IR FLUORO GUIDE CV LINE RIGHT 01/24/2016 Darrell K Allred, PA-C [facility]  . IR GENERIC HISTORICAL  01/24/2016   IR US GUIDE VASC ACCESS RIGHT 01/24/2016  Darrell K Allred, PA-C [facility]  . IR US GUIDE VASC ACCESS RIGHT  02/01/2017  . IR US GUIDE VASC ACCESS RIGHT  03/06/2017  . IR US GUIDE VASC ACCESS RIGHT  03/25/2017  . LEFT HEART CATH AND CORONARY ANGIOGRAPHY N/A 01/07/2018   Procedure: LEFT HEART CATH AND CORONARY ANGIOGRAPHY;  Surgeon: Troy Sine, MD;  Location: [facility];  Service: Cardiovascular;  Laterality: N/A;  . POSTERIOR VITRECTOMY AND MEMBRANE PEEL-LEFT EYE  09/28/2002  . POSTERIOR VITRECTOMY AND MEMBRANE  PEEL-RIGHT EYE  03/16/2002  . RETINAL DETACHMENT SURGERY    . TEE WITHOUT CARDIOVERSION N/A 01/14/2018   Procedure: TRANSESOPHAGEAL ECHOCARDIOGRAM (TEE);  Surgeon: Grace Isaac, MD;  Location: [facility];  Service: Open Heart Surgery;  Laterality: N/A;    Social History   Socioeconomic History  . Marital status: Single    Spouse name: Not on file  . Number of children: 1  . Years of education: Not on file  . Highest education level: Not on file  Occupational History  . Occupation: Unemployed  Social Needs  . Financial resource strain: Patient refused  . Food insecurity:    Worry: Not on file    Inability: Not on file  . Transportation needs:    Medical: Patient refused    Non-medical: Patient refused  Tobacco Use  . Smoking status: Never Smoker  . Smokeless tobacco: Never Used  Substance and Sexual Activity  . Alcohol use: No  . Drug use: No  . Sexual activity: Not Currently  Lifestyle  . Physical activity:    Days per week: Patient refused    Minutes per session: Not on file  . Stress: Patient refused  Relationships  . Social connections:    Talks on phone: Not on file    Gets together: Not on file    Attends religious service: Not on file    Active member of club or organization: Not on file    Attends meetings of clubs or organizations: Not on file    Relationship status: Not on file  . Intimate partner violence:    Fear of current or ex partner: Not on file    Emotionally abused: Not on file    Physically abused: Not on file    Forced sexual activity: Not on file  Other Topics Concern  . Not on file  Social History Narrative  . Not on file    Allergies  Allergen Reactions  . Anesthetics, Amide Nausea And Vomiting  . Penicillins Diarrhea, Nausea And Vomiting and Other (See Comments)    Has patient had a PCN reaction causing immediate rash, facial/tongue/throat swelling, SOB or lightheadedness with hypotension: Yes Has patient had a PCN reaction causing  severe rash involving mucus membranes or skin necrosis: No Has patient had a PCN reaction that required hospitalization No Has patient had a PCN reaction occurring within the last 10 years: Yes  If all of the above answers are "NO", then may proceed with Cephalosporin use.   . Buprenorphine Hcl Rash  . Encainide Nausea And Vomiting  . Metoclopramide Other (See Comments)    Dystonia, muscle rigidity.  Patient states she is only allergic to the "pill form, not the IV"    Family History  Problem Relation Age of Onset  . Cystic fibrosis Mother   . Hypertension Father   . Diabetes Brother   . Hypertension Maternal Grandmother     Prior to Admission medications   Medication Sig Start Date End  Date Taking? Authorizing Provider  acetaminophen (TYLENOL) 325 MG tablet Take 2 tablets (650 mg total) by mouth every 6 (six) hours as needed for mild pain. 01/25/18  Yes Elgie Collard, PA-C  aspirin EC 325 MG EC tablet Take 1 tablet (325 mg total) by mouth daily. 01/25/18  Yes Harriet Pho, Tessa N, PA-C  atorvastatin (LIPITOR) 80 MG tablet Take 1 tablet (80 mg total) by mouth daily at 6 PM. 03/20/18  Yes Newlin, Enobong, MD  busPIRone (BUSPAR) 10 MG tablet Take 1 tablet (10 mg total) by mouth 3 (three) times daily. 03/20/18  Yes [name], MD  diphenhydrAMINE (BENADRYL) 25 mg capsule Take 25 mg by mouth every 6 (six) hours as needed for allergies.   Yes [provider]  docusate sodium (COLACE) 100 MG capsule Take 1 capsule (100 mg total) by mouth daily as needed for mild constipation. 02/03/18  Yes Burtis Junes, NP  furosemide (LASIX) 40 MG tablet Take 1 tablet (40 mg total) by mouth daily. 03/20/18 03/15/19 Yes [name], MD  gabapentin (NEURONTIN) 600 MG tablet Take 600 mg by mouth 2 (two) times daily.   Yes [provider]  guaiFENesin (MUCINEX) 600 MG 12 hr tablet Take 600 mg by mouth 2 (two) times daily.   Yes [provider]  insulin aspart (NOVOLOG) 100 UNIT/ML  injection Inject 0-12 Units into the skin 4 (four) times daily -  before meals and at bedtime. Uses a sliding scale: 70-119 = 0 units, 120-150 = 2 units, 151-180 =  4 units, 181-210 = 5 units, 211-240 = 6 units, 241-270 = 8 units, 271-300 = 9 units, 301-330 = 10 units, 331-360 = 11 units, 361-400 = 12 units, greater than 400 notify MD.   Yes [provider]  insulin detemir (LEVEMIR) 100 UNIT/ML injection Inject 0.15 mLs (15 Units total) into the skin every morning. 03/20/18  Yes Newlin, Enobong, MD  metoprolol tartrate (LOPRESSOR) 25 MG tablet Take 0.5 tablets (12.5 mg total) by mouth 2 (two) times daily. 03/20/18  Yes [name], MD  pantoprazole (PROTONIX) 40 MG tablet Take 1 tablet (40 mg total) by mouth daily. 03/20/18  Yes [name], MD  potassium chloride (K-DUR) 10 MEQ tablet Take 1 tablet (10 mEq total) by mouth daily. 03/20/18  Yes [name], MD  promethazine (PHENERGAN) 12.5 MG tablet Take 1 tablet (12.5 mg total) by mouth every 6 (six) hours as needed for nausea or vomiting. 03/20/18  Yes Newlin, Enobong, MD  pyridostigmine (MESTINON) 60 MG tablet Take 60 mg by mouth 3 (three) times daily.   Yes [provider]  Blood Glucose Monitoring Suppl (ONETOUCH VERIO) w/Device KIT Use as directed 03/21/18   [name], MD  glucose blood test strip Use as instructed 03/21/18   [name], MD  Insulin Syringe-Needle U-100 (BD INSULIN SYRINGE ULTRAFINE) 31G X 15/64" 0.5 ML MISC 1 each by Does not apply route 4 (four) times daily. 03/20/18   [name], MD  Lancets (ONETOUCH DELICA PLUS JJHERD40C) MISC 1 Units by Does not apply route as directed. 03/21/18   [name], MD    Physical Exam: Vitals:   03/31/18 1545 03/31/18 1600 03/31/18 1631 03/31/18 1700  BP: (!) 119/47 (!) 125/48 (!) 118/55 (!) 109/45  Pulse: (!) 109 (!) 111 (!) 107 (!) 105  Resp: _0 Temp:      TempSrc:      SpO2: 100% 100% 100% 99%  Weight:      Height:  General:  Appears calm and comfortable and is NAD; she is quite somnolent (presumably from Haldol, Compazine, and Phenergan) Eyes:  PERRL, EOMI, normal lids, iris ENT:  grossly normal hearing, lips & tongue, dry mm Neck:  no LAD, masses or thyromegaly; no carotid bruits Cardiovascular:  Tachycardia, no m/r/g. No LE edema.  Respiratory:   CTA bilaterally with no wheezes/rales/rhonchi.  Normal respiratory effort. Abdomen:  soft, mildly diffusely TTP,  ND, NABS Skin:  no rash or induration seen on limited exam; she has a keloid along her sternal incision but it is well healed Musculoskeletal:  grossly normal tone BUE/BLE, good ROM, no bony abnormality Psychiatric: somnolent, opens eyes to voice and touch and then drifts back to sleep.  Speech is slowed but appropriate. Neurologic:  CN 2-12 grossly intact, moves all extremities in coordinated fashion    Radiological Exams on Admission: No results found.  EKG: Independently reviewed.  Sinus tachycardia with rate 118; nonspecific ST changes with no evidence of acute ischemia   Labs on Admission: I have personally reviewed the available labs and imaging studies at the time of the admission.  Pertinent labs:   ABG: 7.346/26/14.3 CO2 15 Glucose 486, 490, 510, 417 Anion gap 22 BUN 24/Creatinine 1.58/GFR 37 Bili 2.8 WBC 17.6 Lactate 6.51, 3.04 Troponin 0.00  Assessment/Plan Principal Problem:   DKA (diabetic ketoacidosis) (HCC) Active Problems:   Anxiety and depression   Diabetic gastroparesis (HCC)   AKI (acute kidney injury) ([place])   Essential hypertension   S/P CABG x 3   DKA -Patient with poorly controlled DM due at least in part to non-CPL but also with difficulty keeping medications and fluids down in the setting of gastroparesis -She was just discharged with the same diagnosis on 8/30 and has already had 1 interim ER visit prior to tonight's presentation -Moderate DKA based on pH 7.346, bicarb 14, gap 22 -Patient  with IVF bolus and initiation of insulin drip in ER -Will observe in SDU with insulin drip order set for DKA -A1c during last admission was 8.2, indicating suboptimal control at baseline -No indication of illness as source -Would recommend continuing insulin drip at least until morning regardless of rapidity of closure of gap and normalization of labs -K+4.5 at time of admission and so potassium supplementation added -IVF at 150 cc/hr, NS until glucose <250 and then decrease rate to 125 and change to D51/2NS -Poor vascular access, consider long-term port placement; she currently has a femoral line, which should be sufficient throughout this hospitalization  Gastroparesis -Patient reports that this is part of the reason for repeat admission -She is unable to tolerate PO and so stops taking insulin -Will use Compazine and Zofran for now for n/v with Phenergan for breakthrough  Anxiety/depression -Patient with reported h/o anxiety/depression, taking BuSpar at home  HTN -Labile control thus far in ER -Continue home Lopressor for now and follow; she may not be able to tolerate this medication given currently low BP  AKI -Mild -Baseline creatinine appears to be about 1.1, currently 1.58 -Will rehydrate as per DKA protocol and follow with q4h BMP  CAD -s/p 3v CABG on 7/23 -Needs ongoing beta blocker if able to tolerate -May benefit from addition of ACE once renal function is improved, if BP will tolerate -Continue statin and ASA  DVT prophylaxis: Lovenox Code Status:  Full  Family Communication: None present Disposition Plan:  Home once clinically improved Consults called: None  Admission status: Admit to SDU   Karmen Bongo MD [facility]  If note is complete, please contact covering daytime or nighttime physician. [email or web address] Password [email or web address]  03/31/2018, 5:14 PM

## 2018-03-31 NOTE — ED Triage Notes (Signed)
Patient presents to ED via [facility] states she started with n/v last pm c/o abd.pain this am. Diaphoretic, c/o chest pain when vomiting. . States she took her insulin last pm no insulin today.

## 2018-03-31 NOTE — Telephone Encounter (Signed)
error 

## 2018-03-31 NOTE — ED Notes (Addendum)
Attempted report, call back number left with Vickie.

## 2018-03-31 NOTE — ED Notes (Signed)
Verified insulin rate change with Roselyn Reef, Therapist, sports.

## 2018-03-31 NOTE — Progress Notes (Signed)
Patient arrived to 5W24 on insulin drip. Patient lethargic but arousable and answers questions appropriately. Pt placed on stepdown monitor, blood sugar checked, insulin drip/glucostabilizers adjusted accordingly. Bed low, locked, call light within reach, bed alarm on.  - Filimon Miranda,Rn

## 2018-03-31 NOTE — ED Provider Notes (Signed)
[facility] DEPARTMENT Provider Note   CSN: [id number] Arrival date & time: 03/31/18  1016     History   Chief Complaint Chief Complaint  Patient presents with  . Emesis  . Hyperglycemia    HPI [name] is a 52 y.o. female.  52yo F w/ PMH including IDDM, diabetic gastroparesis, diabetic neuropathy, DKA, CAD s/p CABG who presents with vomiting and abdominal pain.  Vision states she began having nausea and vomiting associated with hyperglycemia last night.  This morning she began having abdominal pain similar to previous episodes due to gastroparesis.  She reports feeling anxious.  She denies any fevers or sore throat.  She took her medications last night but has not had insulin this morning.   LEVEL 5 CAVEAT DUE TO PATIENT DISTRESS  The history is provided by the patient.  Emesis    Hyperglycemia  Associated symptoms: vomiting     Past Medical History:  Diagnosis Date  . AKI (acute kidney injury) ([place])   . Anemia, iron deficiency   . Anxiety and depression 05/18/2015  . CAD (coronary artery disease), native coronary artery 01/11/2018   Non-STEMI with severe three-vessel disease noted July 2019  . Depression   . Diabetes mellitus without complication ([place])   . Diabetic gastroparesis ([place])   . Diabetic neuropathy, type I diabetes mellitus ([place]) 05/18/2015  . DKA (diabetic ketoacidoses) ([place]) 06/16/2017  . DKA, type 1 ([place]) 03/18/2016  . Essential hypertension   . Gastroparesis   . GERD (gastroesophageal reflux disease)   . HLD (hyperlipidemia)   . MDD (major depressive disorder), single episode, severe , no psychosis ([place])   . Non-ST elevation (NSTEMI) myocardial infarction ([place])   . S/P CABG x 3 01/14/2018  . Tardive dyskinesia   . Vitamin B12 deficiency 08/16/2015    Patient Active Problem List   Diagnosis Date Noted  . S/P CABG x 3 01/14/2018  . CAD (coronary artery disease), native coronary artery 01/11/2018  . MDD (major  depressive disorder), single episode, severe , no psychosis ([place])   . DKA (diabetic ketoacidoses) ([place]) 06/16/2017  . Tardive dyskinesia   . DKA, type 1 ([place]) 03/18/2016  . Noncompliance with treatment plan 03/13/2016  . Essential hypertension 01/24/2016  . HLD (hyperlipidemia)   . GERD (gastroesophageal reflux disease)   . AKI (acute kidney injury) ([place])   . Anemia, iron deficiency   . Vitamin B12 deficiency 08/16/2015  . Diabetic gastroparesis ([place])   . Diabetic neuropathy, type I diabetes mellitus ([place]) 05/18/2015  . Anxiety and depression 05/18/2015    Past Surgical History:  Procedure Laterality Date  . CARDIAC SURGERY    . COLONOSCOPY  09/27/2014   at [facility]  . CORONARY ARTERY BYPASS GRAFT N/A 01/14/2018   Procedure: CORONARY ARTERY BYPASS GRAFTING (CABG)X3, RIGHT AND LEFT SAPHENOUS VEIN HARVEST, MAMMARY TAKE DOWN. MAMMARY TO LAD, SVG TO PD, SVG TO DISTAL CIRC.;  Surgeon: Grace Isaac, MD;  Location: [employer];  Service: Open Heart Surgery;  Laterality: N/A;  . ESOPHAGOGASTRODUODENOSCOPY  09/27/2014   at [facility], Dr Rolan Lipa. biospy neg for celiac, neg for H pylori.   . EYE SURGERY    . gailstones    . IR FLUORO GUIDE CV LINE RIGHT  02/01/2017  . IR FLUORO GUIDE CV LINE RIGHT  03/06/2017  . IR FLUORO GUIDE CV LINE RIGHT  03/25/2017  . IR GENERIC HISTORICAL  01/24/2016   IR FLUORO GUIDE CV LINE RIGHT 01/24/2016 Darrell Alphonzo Grieve, PA-C [facility]  . IR GENERIC HISTORICAL  01/24/2016   IR US GUIDE VASC ACCESS RIGHT 01/24/2016 Darrell K Allred, PA-C [facility]  . IR US GUIDE VASC ACCESS RIGHT  02/01/2017  . IR US GUIDE VASC ACCESS RIGHT  03/06/2017  . IR US GUIDE VASC ACCESS RIGHT  03/25/2017  . LEFT HEART CATH AND CORONARY ANGIOGRAPHY N/A 01/07/2018   Procedure: LEFT HEART CATH AND CORONARY ANGIOGRAPHY;  Surgeon: Troy Sine, MD;  Location: [facility];  Service: Cardiovascular;  Laterality: N/A;  . POSTERIOR VITRECTOMY AND MEMBRANE PEEL-LEFT EYE  09/28/2002  . POSTERIOR  VITRECTOMY AND MEMBRANE PEEL-RIGHT EYE  03/16/2002  . RETINAL DETACHMENT SURGERY    . TEE WITHOUT CARDIOVERSION N/A 01/14/2018   Procedure: TRANSESOPHAGEAL ECHOCARDIOGRAM (TEE);  Surgeon: Grace Isaac, MD;  Location: [facility];  Service: Open Heart Surgery;  Laterality: N/A;     OB History   None      Home Medications    Prior to Admission medications   Medication Sig Start Date End Date Taking? Authorizing Provider  acetaminophen (TYLENOL) 325 MG tablet Take 2 tablets (650 mg total) by mouth every 6 (six) hours as needed for mild pain. 01/25/18  Yes Elgie Collard, PA-C  aspirin EC 325 MG EC tablet Take 1 tablet (325 mg total) by mouth daily. 01/25/18  Yes Harriet Pho, Tessa N, PA-C  atorvastatin (LIPITOR) 80 MG tablet Take 1 tablet (80 mg total) by mouth daily at 6 PM. 03/20/18  Yes Newlin, Enobong, MD  busPIRone (BUSPAR) 10 MG tablet Take 1 tablet (10 mg total) by mouth 3 (three) times daily. 03/20/18  Yes [name], MD  diphenhydrAMINE (BENADRYL) 25 mg capsule Take 25 mg by mouth every 6 (six) hours as needed for allergies.   Yes [provider]  docusate sodium (COLACE) 100 MG capsule Take 1 capsule (100 mg total) by mouth daily as needed for mild constipation. 02/03/18  Yes Burtis Junes, NP  furosemide (LASIX) 40 MG tablet Take 1 tablet (40 mg total) by mouth daily. 03/20/18 03/15/19 Yes [name], MD  gabapentin (NEURONTIN) 600 MG tablet Take 600 mg by mouth 2 (two) times daily.   Yes [provider]  guaiFENesin (MUCINEX) 600 MG 12 hr tablet Take 600 mg by mouth 2 (two) times daily.   Yes [provider]  insulin aspart (NOVOLOG) 100 UNIT/ML injection Inject 0-12 Units into the skin 4 (four) times daily -  before meals and at bedtime. Uses a sliding scale: 70-119 = 0 units, 120-150 = 2 units, 151-180 =  4 units, 181-210 = 5 units, 211-240 = 6 units, 241-270 = 8 units, 271-300 = 9 units, 301-330 = 10 units, 331-360 = 11 units, 361-400 = 12 units, greater  than 400 notify MD.   Yes [provider]  insulin detemir (LEVEMIR) 100 UNIT/ML injection Inject 0.15 mLs (15 Units total) into the skin every morning. 03/20/18  Yes Newlin, Enobong, MD  metoprolol tartrate (LOPRESSOR) 25 MG tablet Take 0.5 tablets (12.5 mg total) by mouth 2 (two) times daily. 03/20/18  Yes [name], MD  pantoprazole (PROTONIX) 40 MG tablet Take 1 tablet (40 mg total) by mouth daily. 03/20/18  Yes [name], MD  potassium chloride (K-DUR) 10 MEQ tablet Take 1 tablet (10 mEq total) by mouth daily. 03/20/18  Yes [name], MD  promethazine (PHENERGAN) 12.5 MG tablet Take 1 tablet (12.5 mg total) by mouth every 6 (six) hours as needed for nausea or vomiting. 03/20/18  Yes [name], MD  pyridostigmine (MESTINON) 60 MG tablet Take 60 mg by mouth 3 (three) times daily.   Yes [provider]  Blood Glucose Monitoring Suppl (ONETOUCH VERIO) w/Device KIT Use as directed 03/21/18   [name], MD  glucose blood test strip Use as instructed 03/21/18   [name], MD  Insulin Syringe-Needle U-100 (BD INSULIN SYRINGE ULTRAFINE) 31G X 15/64" 0.5 ML MISC 1 each by Does not apply route 4 (four) times daily. 03/20/18   [name], MD  Lancets (ONETOUCH DELICA PLUS UXYBFX83A) MISC 1 Units by Does not apply route as directed. 03/21/18   [name], MD    Family History Family History  Problem Relation Age of Onset  . Cystic fibrosis Mother   . Hypertension Father   . Diabetes Brother   . Hypertension Maternal Grandmother     Social History Social History   Tobacco Use  . Smoking status: Never Smoker  . Smokeless tobacco: Never Used  Substance Use Topics  . Alcohol use: No  . Drug use: No     Allergies   Anesthetics, amide; Penicillins; Buprenorphine hcl; Encainide; and Metoclopramide   Review of Systems Review of Systems  Unable to perform ROS: Acuity of condition  Gastrointestinal: Positive for vomiting.     Physical  Exam Updated Vital Signs BP (!) 118/55   Pulse (!) 107   Temp 97.9 F (36.6 C) (Axillary)   Resp 19   Ht _0  (1.549 m)   Wt 61 kg   SpO2 100%   BMI 25.41 kg/m   Physical Exam  Constitutional: She is oriented to person, place, and time. She appears well-developed and well-nourished. She appears distressed.  HENT:  Head: Normocephalic and atraumatic.  Moist mucous membranes  Eyes: Conjunctivae are normal.  Neck: Neck supple.  Cardiovascular: Regular rhythm and normal heart sounds. Tachycardia present.  No murmur heard. Pulmonary/Chest: Breath sounds normal. Tachypnea noted. No respiratory distress.  Abdominal: Soft. Bowel sounds are normal. She exhibits no distension. There is tenderness (generalized). There is guarding (voluntary). There is no rebound.  Musculoskeletal: She exhibits no edema.  Neurological: She is alert and oriented to person, place, and time. No sensory deficit ( ).  Fluent speech  Skin: Skin is warm. She is diaphoretic.  Psychiatric: Her mood appears anxious.  Whispering 1-2 word answers, rocking back and forth  Nursing note and vitals reviewed.    ED Treatments / Results  Labs (all labs ordered are listed, but only abnormal results are displayed) Labs Reviewed  COMPREHENSIVE METABOLIC PANEL - Abnormal; Notable for the following components:      Result Value   CO2 15 (*)    Glucose, Bld 486 (*)    BUN 24 (*)    Creatinine, Ser 1.58 (*)    Total Bilirubin 2.8 (*)    GFR calc non Af Amer 37 (*)    GFR calc Af Amer 43 (*)    Anion gap 22 (*)    All other components within normal limits  CBC WITH DIFFERENTIAL/PLATELET - Abnormal; Notable for the following components:   WBC 17.6 (*)    HCT 47.0 (*)    Neutro Abs 15.5 (*)    All other components within normal limits  I-STAT CG4 LACTIC ACID, ED - Abnormal; Notable for the following components:   Lactic Acid, Venous 6.51 (*)    All other components within normal limits  I-STAT VENOUS BLOOD GAS, ED  - Abnormal; Notable for the following components:  pCO2, Ven 34.2 (*)    pO2, Ven 64.0 (*)    Bicarbonate 16.2 (*)    TCO2 17 (*)    Acid-base deficit 9.0 (*)    All other components within normal limits  CBG MONITORING, ED - Abnormal; Notable for the following components:   Glucose-Capillary 446 (*)    All other components within normal limits  I-STAT ARTERIAL BLOOD GAS, ED - Abnormal; Notable for the following components:   pH, Arterial 7.346 (*)    pCO2 arterial 26.0 (*)    Bicarbonate 14.3 (*)    TCO2 15 (*)    Acid-base deficit 10.0 (*)    All other components within normal limits  I-STAT CG4 LACTIC ACID, ED - Abnormal; Notable for the following components:   Lactic Acid, Venous 3.04 (*)    All other components within normal limits  CBG MONITORING, ED - Abnormal; Notable for the following components:   Glucose-Capillary 490 (*)    All other components within normal limits  CBG MONITORING, ED - Abnormal; Notable for the following components:   Glucose-Capillary 510 (*)    All other components within normal limits  CBG MONITORING, ED - Abnormal; Notable for the following components:   Glucose-Capillary 417 (*)    All other components within normal limits  URINALYSIS, ROUTINE W REFLEX MICROSCOPIC  I-STAT BETA HCG BLOOD, ED ([facility], [facility], [facility] ONLY)  I-STAT TROPONIN, ED  I-STAT CG4 LACTIC ACID, ED    EKG EKG Interpretation  Date/Time:  Monday March 31 2018 10:23:44 EDT Ventricular Rate:  118 PR Interval:    QRS Duration: 87 QT Interval:  342 QTC Calculation: 480 R Axis:   -38 Text Interpretation:  Sinus tachycardia Left axis deviation Anteroseptal infarct, age indeterminate Baseline wander in lead(s) II similar to previous Confirmed by Theotis Burrow [phone]) on 03/31/2018 10:32:37 AM   Radiology No results found.  Procedures .Central Line Date/Time: 03/31/2018 3:18 PM Performed by: Sharlett Iles, MD Authorized by: Sharlett Iles, MD   Consent:     Consent obtained:  Verbal   Consent given by:  Patient   Risks discussed:  Arterial puncture, bleeding, infection, nerve damage and incorrect placement   Alternatives discussed:  No treatment Universal protocol:    Procedure explained and questions answered to patient or proxy's satisfaction: yes     Immediately prior to procedure, a time out was called: yes     Patient identity confirmed:  Verbally with patient and arm band Pre-procedure details:    Hand hygiene: Hand hygiene performed prior to insertion     Skin preparation:  ChloraPrep   Skin preparation agent: Skin preparation agent completely dried prior to procedure   Anesthesia (see MAR for exact dosages):    Anesthesia method:  Local infiltration   Local anesthetic:  Lidocaine 1% w/o epi Procedure details:    Location:  L femoral   Site selection rationale:  Active vomiting, concern for contamination of sterile field   Patient position:  Reverse Trendelenburg   Procedural supplies:  Triple lumen   Catheter size:  7 Fr   Landmarks identified: yes     Ultrasound guidance: yes     Sterile ultrasound techniques: Sterile gel and sterile probe covers were used     Number of attempts:  1   Successful placement: yes   Post-procedure details:    Post-procedure:  Dressing applied and line sutured   Assessment:  Blood return through all ports and free fluid flow   Patient tolerance  of procedure:  Tolerated well, no immediate complications .Critical Care Performed by: Sharlett Iles, MD Authorized by: Sharlett Iles, MD   Critical care provider statement:    Critical care time (minutes):  60   Critical care time was exclusive of:  Separately billable procedures and treating other patients   Critical care was necessary to treat or prevent imminent or life-threatening deterioration of the following conditions:  Endocrine crisis and dehydration   Critical care was time spent personally by me on the following activities:   Development of treatment plan with patient or surrogate, discussions with consultants, evaluation of patient's response to treatment, examination of patient, obtaining history from patient or surrogate, ordering and performing treatments and interventions, ordering and review of laboratory studies, re-evaluation of patient's condition and review of old charts   (including critical care time)  Medications Ordered in ED Medications  insulin regular (NOVOLIN R,HUMULIN R) 100 Units in sodium chloride 0.9 % 100 mL (1 Units/mL) infusion ( Intravenous Rate/Dose Verify 03/31/18 1613)  0.9 %  sodium chloride infusion (has no administration in time range)  dextrose 5 %-0.45 % sodium chloride infusion (has no administration in time range)  promethazine (PHENERGAN) injection 25 mg (25 mg Intramuscular Given 03/31/18 1043)  insulin detemir (LEVEMIR) injection 15 Units (15 Units Subcutaneous Given 03/31/18 1156)  haloperidol lactate (HALDOL) injection 5 mg (5 mg Intramuscular Given 03/31/18 1151)  sodium chloride 0.9 % bolus 3,000 mL (0 mLs Intravenous Stopped 03/31/18 1459)  potassium chloride 10 mEq in 100 mL IVPB (0 mEq Intravenous Stopped 03/31/18 1608)  prochlorperazine (COMPAZINE) injection 10 mg (10 mg Intravenous Given 03/31/18 1527)     Initial Impression / Assessment and Plan / ED Course  I have reviewed the triage vital signs and the nursing notes.  Pertinent labs & imaging results that were available during my care of the patient were reviewed by me and considered in my medical decision making (see chart for details).     Patient was diaphoretic, tachycardic, apneic, and in distress on exam although mentating appropriately.  Generalized abdominal tenderness.  Blood pressure 180/71, afebrile.  Blood glucose elevated in the 400s.  Gave home dose of Levemir while awaiting labs.  Gave Phenergan IM, later Haldol for ongoing symptoms.  Work showed an initial lactate of 6.5 which I feel is due to  dehydration and DKA given the patient's symptoms and multiple episodes of vomiting.  No fevers or other infectious symptoms to suggest lactate is due to sepsis.  Creatinine 1.58, potassium 4.5, glucose 486, anion gap 22, WBC 17.6.  UA is still pending.  Initiated DKA protocol with insulin drip, multiple IV fluid boluses.  Discussed with Dr. Phillip Heal, critical care, who feels comfortable allowing patient to go to floor if lactate is downtrending.  Repeat lactate after 2 L of fluids is 3.  Discussed admission with [facility] and pt admitted for further care. Final Clinical Impressions(s) / ED Diagnoses   Final diagnoses:  Diabetic ketoacidosis without coma associated with other specified diabetes mellitus [facility])  Dehydration    ED Discharge Orders    None       Geraldine Sandberg, Wenda Overland, MD 03/31/18 1642

## 2018-03-31 NOTE — ED Notes (Signed)
CBG 490 at 12:42. Notified RN/MD.

## 2018-04-01 DIAGNOSIS — K3184 Gastroparesis: Secondary | ICD-10-CM | POA: Diagnosis not present

## 2018-04-01 DIAGNOSIS — I10 Essential (primary) hypertension: Secondary | ICD-10-CM

## 2018-04-01 DIAGNOSIS — F419 Anxiety disorder, unspecified: Secondary | ICD-10-CM | POA: Diagnosis not present

## 2018-04-01 DIAGNOSIS — E1143 Type 2 diabetes mellitus with diabetic autonomic (poly)neuropathy: Secondary | ICD-10-CM

## 2018-04-01 DIAGNOSIS — Z951 Presence of aortocoronary bypass graft: Secondary | ICD-10-CM

## 2018-04-01 DIAGNOSIS — E1310 Other specified diabetes mellitus with ketoacidosis without coma: Secondary | ICD-10-CM

## 2018-04-01 DIAGNOSIS — F329 Major depressive disorder, single episode, unspecified: Secondary | ICD-10-CM | POA: Diagnosis not present

## 2018-04-01 DIAGNOSIS — E860 Dehydration: Secondary | ICD-10-CM

## 2018-04-01 LAB — URINALYSIS, ROUTINE W REFLEX MICROSCOPIC
BACTERIA UA: NONE SEEN
BILIRUBIN URINE: NEGATIVE
Glucose, UA: >=500 mg/dL — AB
Hgb urine dipstick: NEGATIVE
KETONES UR: 80 mg/dL — AB
LEUKOCYTES UA: NEGATIVE
NITRITE: NEGATIVE
Protein, ur: 30 mg/dL — AB
Specific Gravity, Urine: 1.02 (ref 1.005–1.030)
pH: 5 (ref 5.0–8.0)

## 2018-04-01 LAB — MAGNESIUM: Magnesium: 1.8 mg/dL (ref 1.7–2.4)

## 2018-04-01 LAB — BASIC METABOLIC PANEL
ANION GAP: 3 — AB (ref 5–15)
ANION GAP: 5 (ref 5–15)
ANION GAP: 6 (ref 5–15)
BUN: 26 mg/dL — ABNORMAL HIGH (ref 6–20)
BUN: 23 mg/dL — ABNORMAL HIGH (ref 6–20)
BUN: 22 mg/dL — ABNORMAL HIGH (ref 6–20)
CALCIUM: 8.5 mg/dL — AB (ref 8.9–10.3)
CALCIUM: 8.4 mg/dL — AB (ref 8.9–10.3)
CHLORIDE: 117 mmol/L — AB (ref 98–111)
CO2: 19 mmol/L — ABNORMAL LOW (ref 22–32)
CO2: 20 mmol/L — ABNORMAL LOW (ref 22–32)
CO2: 18 mmol/L — AB (ref 22–32)
CREATININE: 1.23 mg/dL — AB (ref 0.44–1.00)
Calcium: 8.3 mg/dL — ABNORMAL LOW (ref 8.9–10.3)
Chloride: 118 mmol/L — ABNORMAL HIGH (ref 98–111)
Chloride: 117 mmol/L — ABNORMAL HIGH (ref 98–111)
Creatinine, Ser: 1.42 mg/dL — ABNORMAL HIGH (ref 0.44–1.00)
Creatinine, Ser: 1.29 mg/dL — ABNORMAL HIGH (ref 0.44–1.00)
GFR calc Af Amer: 49 mL/min — ABNORMAL LOW (ref >60)
GFR calc Af Amer: 55 mL/min — ABNORMAL LOW (ref >60)
GFR calc non Af Amer: 50 mL/min — ABNORMAL LOW (ref >60)
GFR, EST AFRICAN AMERICAN: 58 mL/min — AB (ref >60)
GFR, EST NON AFRICAN AMERICAN: 42 mL/min — AB (ref >60)
GFR, EST NON AFRICAN AMERICAN: 47 mL/min — AB (ref >60)
Glucose, Bld: 118 mg/dL — ABNORMAL HIGH (ref 70–99)
Glucose, Bld: 98 mg/dL (ref 70–99)
Glucose, Bld: 101 mg/dL — ABNORMAL HIGH (ref 70–99)
POTASSIUM: 3.7 mmol/L (ref 3.5–5.1)
Potassium: 3.8 mmol/L (ref 3.5–5.1)
Potassium: 3.9 mmol/L (ref 3.5–5.1)
SODIUM: 140 mmol/L (ref 135–145)
Sodium: 142 mmol/L (ref 135–145)
Sodium: 141 mmol/L (ref 135–145)

## 2018-04-01 LAB — GLUCOSE, CAPILLARY
GLUCOSE-CAPILLARY: 99 mg/dL (ref 70–99)
GLUCOSE-CAPILLARY: 87 mg/dL (ref 70–99)
GLUCOSE-CAPILLARY: 84 mg/dL (ref 70–99)
GLUCOSE-CAPILLARY: 118 mg/dL — AB (ref 70–99)
GLUCOSE-CAPILLARY: 125 mg/dL — AB (ref 70–99)
GLUCOSE-CAPILLARY: 188 mg/dL — AB (ref 70–99)
Glucose-Capillary: 103 mg/dL — ABNORMAL HIGH (ref 70–99)
Glucose-Capillary: 121 mg/dL — ABNORMAL HIGH (ref 70–99)
Glucose-Capillary: 93 mg/dL (ref 70–99)

## 2018-04-01 LAB — CBC
HCT: 32.7 % — ABNORMAL LOW (ref 36.0–46.0)
Hemoglobin: 10.3 g/dL — ABNORMAL LOW (ref 12.0–15.0)
MCH: 29.2 pg (ref 26.0–34.0)
MCHC: 31.5 g/dL (ref 30.0–36.0)
MCV: 92.6 fL (ref 80.0–100.0)
PLATELETS: 244 10*3/uL (ref 150–400)
RBC: 3.53 MIL/uL — ABNORMAL LOW (ref 3.87–5.11)
RDW: 14.5 % (ref 11.5–15.5)
WBC: 14.9 10*3/uL — ABNORMAL HIGH (ref 4.0–10.5)

## 2018-04-01 LAB — RAPID URINE DRUG SCREEN, HOSP PERFORMED
Amphetamines: NOT DETECTED
Barbiturates: NOT DETECTED
Benzodiazepines: NOT DETECTED
Cocaine: NOT DETECTED
Opiates: NOT DETECTED
TETRAHYDROCANNABINOL: NOT DETECTED

## 2018-04-01 MED ORDER — PROMETHAZINE HCL 25 MG/ML IJ SOLN: 6.2500 mg | INTRAMUSCULAR | Status: DC | PRN

## 2018-04-01 MED ORDER — SODIUM CHLORIDE 0.9 % IV SOLN
INTRAVENOUS | Status: DC
  Administered 2018-04-01: 07:00:00 via INTRAVENOUS

## 2018-04-01 MED ORDER — INSULIN DETEMIR 100 UNIT/ML ~~LOC~~ SOLN
10.0000 [IU] | Freq: Every day | SUBCUTANEOUS | Status: DC
  Administered 2018-04-01: 10 [IU] via SUBCUTANEOUS
  Filled 2018-04-01: qty 0.1

## 2018-04-01 MED ORDER — INSULIN DETEMIR 100 UNIT/ML ~~LOC~~ SOLN: 10.0000 [IU] | Freq: Every day | SUBCUTANEOUS | Status: DC

## 2018-04-01 MED ORDER — INSULIN ASPART 100 UNIT/ML ~~LOC~~ SOLN
0.0000 [IU] | Freq: Three times a day (TID) | SUBCUTANEOUS | Status: DC
  Administered 2018-04-01: 3 [IU] via SUBCUTANEOUS
  Administered 2018-04-01: 2 [IU] via SUBCUTANEOUS

## 2018-04-01 NOTE — Discharge Instructions (Signed)
Follow with Primary MD [name], MD in 7 days   Get CBC, CMP, 2 view Chest X ray checked  by Primary MD in 5-7 days   Activity: As tolerated with Full fall precautions use walker/cane & assistance as needed  Disposition Home   Diet: Heart Healthy low carbohydrate  Accuchecks 4 times/day, Once in AM empty stomach and then before each meal. Log in all results and show them to your Prim.MD in 3 days. If any glucose reading is under 80 or above 300 call your Prim MD immidiately. Follow Low glucose instructions for glucose under 80 as instructed.   For Heart failure patients - Check your Weight same time everyday, if you gain over 2 pounds, or you develop in leg swelling, experience more shortness of breath or chest pain, call your Primary MD immediately. Follow Cardiac Low Salt Diet and 1.5 lit/day fluid restriction.  Special Instructions: If you have smoked or chewed Tobacco  in the last 2 yrs please stop smoking, stop any regular Alcohol  and or any Recreational drug use.  On your next visit with your primary care physician please Get Medicines reviewed and adjusted.  Please request your Prim.MD to go over all Hospital Tests and Procedure/Radiological results at the follow up, please get all Hospital records sent to your Prim MD by signing hospital release before you go home.  If you experience worsening of your admission symptoms, develop shortness of breath, life threatening emergency, suicidal or homicidal thoughts you must seek medical attention immediately by calling 911 or calling your MD immediately  if symptoms less severe.  You Must read complete instructions/literature along with all the possible adverse reactions/side effects for all the Medicines you take and that have been prescribed to you. Take any new Medicines after you have completely understood and accpet all the possible adverse reactions/side effects.

## 2018-04-01 NOTE — Progress Notes (Signed)
[name] to be D/C'd Home per MD order.  Discussed with the patient and all questions fully answered.  VSS, Skin clean, dry and intact without evidence of skin break down, no evidence of skin tears noted. IV catheter discontinued intact. Site without signs and symptoms of complications. Dressing and pressure applied.  An After Visit Summary was printed and given to the patient. Patient received prescription.  D/c education completed with patient/family including follow up instructions, medication list, d/c activities limitations if indicated, with other d/c instructions as indicated by MD - patient able to verbalize understanding, all questions fully answered.   Patient instructed to return to ED, call 911, or call MD for any changes in condition.   Patient escorted via [place], and D/C home via private auto.  Holley Raring 04/01/2018 6:57 PM

## 2018-04-01 NOTE — Progress Notes (Signed)
Patient alert and drowsy with assess. No complaints of pain noted throughout shift.  Blood glucose trending down with glucose stabilizer, back within normal ranges by end of shift.  Orders received to discontinue the drip at shift change, patient educated on use of insulin drip, discontinuation and symptoms to report to nursing staff.  Stable condition at end of shift, will continue to monitor.

## 2018-04-01 NOTE — Discharge Summary (Signed)
[name] XNA:[id number] DOB: Jan 23, 1966 DOA: 03/31/2018  PCP: [name], MD  Admit date: 03/31/2018  Discharge date: 04/01/2018  Admitted From: Home   Disposition:  Home   Recommendations for Outpatient Follow-up:   Follow up with PCP in 1-2 weeks  PCP Please obtain BMP/CBC, 2 view CXR in 1week,  (see Discharge instructions)   PCP Please follow up on the following pending results:    Home Health: PT, RN   Equipment/Devices: None  Consultations: None Discharge Condition: Stable   CODE STATUS: Full   Diet Recommendation: Heart Healthy Low Carb   Chief Complaint  Patient presents with  . Emesis  . Hyperglycemia     Brief history of present illness from the day of admission and additional interim summary    [name] is a 52 y.o. female with medical history significant of CAD s/p CABG 7/19; HTN; HLD; MDD with tardive dyskinesia; and poorly controlled DM with multiple prior admissions for DKA with diabetic complications including neuropathy, gastroparesis, and retinopathy presenting with hyperglycemia, she apparently missed a few doses of insulin the day before for no good reason, in the ER was diagnosed with DKA along with metabolic encephalopathy and admitted.                                                                 Hospital Course    DKA in a patient with DM type II.  Poor glycemic control and compliance.  Missed insulin at least 2-3 doses according to the patient, no clear reason given, she does have supplies.  Treated with DKA protocol, DKA has resolved she has been transitioned to long-acting insulin along with sliding scale, tolerating diet, will ambulate increase activity, PT eval and then discharge home if stable.  Counseled on compliance, will follow with PCP within a week of  discharge.  Home PT and RN also ordered.  Diabetic gastroparesis.  Able tolerating diet, supportive care, home regimen to be continued upon discharge.  History of anxiety and depression.  Continue home regimen no acute issues, not suicidal or homicidal.  Essential hypertension.  Stable continue home medications.  CAD status post CABG.  No acute issues, you home dose aspirin, statin and beta-blocker for secondary prevention.  ARF - due to DKA much improved, PCP to monitor post discharge.   Discharge diagnosis     Principal Problem:   DKA (diabetic ketoacidosis) ([place]) Active Problems:   Anxiety and depression   Diabetic gastroparesis ([facility])   AKI (acute kidney injury) ([place])   Essential hypertension   Dehydration   S/P CABG x 3    Discharge instructions    Discharge Instructions    Diet - low sodium heart healthy   Complete by:  As directed    Increase activity slowly   Complete by:  As directed       Discharge Medications   Allergies as of 04/01/2018      Reactions   Anesthetics, Amide Nausea And Vomiting   Penicillins Diarrhea, Nausea And Vomiting, Other (See Comments)   Has patient had a PCN reaction causing immediate rash, facial/tongue/throat swelling, SOB or lightheadedness with hypotension: Yes Has patient had a PCN reaction causing severe rash involving mucus membranes or skin necrosis: No Has patient had a PCN reaction that required hospitalization No Has patient had a PCN reaction occurring within the last 10 years: Yes  If all of the above answers are "NO", then may proceed with Cephalosporin use.   Buprenorphine Hcl Rash   Encainide Nausea And Vomiting   Metoclopramide Other (See Comments)   Dystonia, muscle rigidity.  Patient states she is only allergic to the "pill form, not the IV"      Medication List    TAKE these medications   acetaminophen 325 MG tablet Commonly known as:  TYLENOL Take 2 tablets (650 mg total) by mouth every 6 (six) hours as  needed for mild pain.   aspirin 325 MG EC tablet Take 1 tablet (325 mg total) by mouth daily.   atorvastatin 80 MG tablet Commonly known as:  LIPITOR Take 1 tablet (80 mg total) by mouth daily at 6 PM.   busPIRone 10 MG tablet Commonly known as:  BUSPAR Take 1 tablet (10 mg total) by mouth 3 (three) times daily.   diphenhydrAMINE 25 mg capsule Commonly known as:  BENADRYL Take 25 mg by mouth every 6 (six) hours as needed for allergies.   docusate sodium 100 MG capsule Commonly known as:  COLACE Take 1 capsule (100 mg total) by mouth daily as needed for mild constipation.   furosemide 40 MG tablet Commonly known as:  LASIX Take 1 tablet (40 mg total) by mouth daily.   gabapentin 600 MG tablet Commonly known as:  NEURONTIN Take 600 mg by mouth 2 (two) times daily.   glucose blood test strip Use as instructed   guaiFENesin 600 MG 12 hr tablet Commonly known as:  MUCINEX Take 600 mg by mouth 2 (two) times daily.   insulin aspart 100 UNIT/ML injection Commonly known as:  novoLOG Inject 0-12 Units into the skin 4 (four) times daily -  before meals and at bedtime. Uses a sliding scale: 70-119 = 0 units, 120-150 = 2 units, 151-180 =  4 units, 181-210 = 5 units, 211-240 = 6 units, 241-270 = 8 units, 271-300 = 9 units, 301-330 = 10 units, 331-360 = 11 units, 361-400 = 12 units, greater than 400 notify MD.   insulin detemir 100 UNIT/ML injection Commonly known as:  LEVEMIR Inject 0.15 mLs (15 Units total) into the skin every morning.   Insulin Syringe-Needle U-100 31G X 15/64" 0.5 ML Misc 1 each by Does not apply route 4 (four) times daily.   metoprolol tartrate 25 MG tablet Commonly known as:  LOPRESSOR Take 0.5 tablets (12.5 mg total) by mouth 2 (two) times daily.   ONETOUCH DELICA PLUS [id number] Misc 1 Units by Does not apply route as directed.   ONETOUCH VERIO w/Device Kit Use as directed   pantoprazole 40 MG tablet Commonly known as:  PROTONIX Take 1 tablet (40 mg  total) by mouth daily.   potassium chloride 10 MEQ tablet Commonly known as:  K-DUR Take 1 tablet (10 mEq total) by mouth daily.   promethazine 12.5 MG tablet Commonly known as:  PHENERGAN Take  1 tablet (12.5 mg total) by mouth every 6 (six) hours as needed for nausea or vomiting.   pyridostigmine 60 MG tablet Commonly known as:  MESTINON Take 60 mg by mouth 3 (three) times daily.       Follow-up Information    [name], MD. Schedule an appointment as soon as possible for a visit in 1 week(s).   Specialty:  Family Medicine Contact information: [place] [address] [phone]        Nahser, Wonda Cheng, MD .   Specialty:  Cardiology Contact information: [place] [address] [place] [address] [phone]           Major procedures and Radiology Reports - PLEASE review detailed and final reports thoroughly  -       No results found.  Micro Results     No results found for this or any previous visit (from the past 240 hour(s)).  Today   Subjective    Marabelle Jeffcoat today has no headache,no chest abdominal pain,no new weakness tingling or numbness, feels much better wants to go home today.    Objective   Blood pressure 139/65, pulse 82, temperature 98.5 F (36.9 C), temperature source Oral, resp. rate (!) 8, height 5' 1" (1.549 m), weight 61 kg, SpO2 100 %.   Intake/Output Summary (Last 24 hours) at 04/01/2018 1025 Last data filed at 04/01/2018 0710 Gross per 24 hour  Intake 2859 ml  Output 600 ml  Net 2259 ml    Exam  Awake Alert, Oriented x 3, No new F.N deficits, Normal affect [place].AT,PERRAL Supple Neck,No JVD, No cervical lymphadenopathy appriciated.  Symmetrical Chest wall movement, Good air movement bilaterally, CTAB RRR,No Gallops,Rubs or new Murmurs, No Parasternal Heave +ve B.Sounds, Abd Soft, Non tender, No organomegaly appriciated, No rebound -guarding or rigidity. No Cyanosis, Clubbing or edema,  No new Rash or bruise   Data Review   CBC w Diff:  Lab Results  Component Value Date   WBC 14.9 (H) 04/01/2018   HGB 10.3 (L) 04/01/2018   HCT 32.7 (L) 04/01/2018   HCT REJ5 03/23/2016   PLT 244 04/01/2018   LYMPHOPCT 5 03/31/2018   MONOPCT 5 03/31/2018   EOSPCT 1 03/31/2018   BASOPCT 0 03/31/2018    CMP:  Lab Results  Component Value Date   NA 141 04/01/2018   NA 138 03/20/2018   K 3.7 04/01/2018   CL 117 (H) 04/01/2018   CO2 18 (L) 04/01/2018   BUN 22 (H) 04/01/2018   BUN 13 03/20/2018   CREATININE 1.23 (H) 04/01/2018   CREATININE 1.11 (H) 09/04/2016   GLU 230 04/15/2017   PROT 8.1 03/31/2018   ALBUMIN 4.3 03/31/2018   BILITOT 2.8 (H) 03/31/2018   ALKPHOS 108 03/31/2018   AST 33 03/31/2018   ALT 34 03/31/2018  . Lab Results  Component Value Date   HGBA1C 8.2 (H) 02/19/2018   CBG (last 3)  Recent Labs    04/01/18 0511 04/01/18 0624 04/01/18 0825  GLUCAP 93 118* 103*     Total Time in preparing paper work, data evaluation and todays exam - 68 minutes  Lala Lund M.D on 04/01/2018 at 10:25 AM  [facility]   Office  [phone]

## 2018-04-02 ENCOUNTER — Telehealth: Payer: Self-pay

## 2018-04-02 NOTE — Telephone Encounter (Signed)
Call placed to [facility], # [phone], spoke to [place] who confirmed that she has received the application and it is in their system.

## 2018-04-03 DIAGNOSIS — E1065 Type 1 diabetes mellitus with hyperglycemia: Secondary | ICD-10-CM | POA: Diagnosis not present

## 2018-04-03 DIAGNOSIS — E860 Dehydration: Secondary | ICD-10-CM | POA: Diagnosis not present

## 2018-04-08 DIAGNOSIS — E1065 Type 1 diabetes mellitus with hyperglycemia: Secondary | ICD-10-CM | POA: Diagnosis not present

## 2018-04-08 DIAGNOSIS — E860 Dehydration: Secondary | ICD-10-CM | POA: Diagnosis not present

## 2018-04-10 DIAGNOSIS — E860 Dehydration: Secondary | ICD-10-CM | POA: Diagnosis not present

## 2018-04-10 DIAGNOSIS — E1065 Type 1 diabetes mellitus with hyperglycemia: Secondary | ICD-10-CM | POA: Diagnosis not present

## 2018-04-13 ENCOUNTER — Inpatient Hospital Stay (HOSPITAL_COMMUNITY)
Admission: EM | Admit: 2018-04-13 | Discharge: 2018-04-15 | DRG: 074 | Disposition: A | Discharge status: Home / Self Care | Payer: Medicare Other | Attending: Internal Medicine | Admitting: Internal Medicine

## 2018-04-13 ENCOUNTER — Encounter (HOSPITAL_COMMUNITY): Payer: Self-pay | Admitting: Emergency Medicine

## 2018-04-13 DIAGNOSIS — R000 Tachycardia, unspecified: Secondary | ICD-10-CM | POA: Diagnosis present

## 2018-04-13 DIAGNOSIS — I10 Essential (primary) hypertension: Secondary | ICD-10-CM | POA: Diagnosis not present

## 2018-04-13 DIAGNOSIS — G2401 Drug induced subacute dyskinesia: Secondary | ICD-10-CM | POA: Diagnosis not present

## 2018-04-13 DIAGNOSIS — I2510 Atherosclerotic heart disease of native coronary artery without angina pectoris: Secondary | ICD-10-CM | POA: Diagnosis present

## 2018-04-13 DIAGNOSIS — F329 Major depressive disorder, single episode, unspecified: Secondary | ICD-10-CM | POA: Diagnosis not present

## 2018-04-13 DIAGNOSIS — R600 Localized edema: Secondary | ICD-10-CM | POA: Diagnosis present

## 2018-04-13 DIAGNOSIS — Z23 Encounter for immunization: Secondary | ICD-10-CM | POA: Diagnosis not present

## 2018-04-13 DIAGNOSIS — K5909 Other constipation: Secondary | ICD-10-CM | POA: Diagnosis present

## 2018-04-13 DIAGNOSIS — Z884 Allergy status to anesthetic agent status: Secondary | ICD-10-CM

## 2018-04-13 DIAGNOSIS — E1065 Type 1 diabetes mellitus with hyperglycemia: Secondary | ICD-10-CM | POA: Diagnosis present

## 2018-04-13 DIAGNOSIS — E1143 Type 2 diabetes mellitus with diabetic autonomic (poly)neuropathy: Secondary | ICD-10-CM | POA: Diagnosis not present

## 2018-04-13 DIAGNOSIS — Z8249 Family history of ischemic heart disease and other diseases of the circulatory system: Secondary | ICD-10-CM | POA: Diagnosis not present

## 2018-04-13 DIAGNOSIS — E1165 Type 2 diabetes mellitus with hyperglycemia: Secondary | ICD-10-CM | POA: Diagnosis not present

## 2018-04-13 DIAGNOSIS — E876 Hypokalemia: Secondary | ICD-10-CM | POA: Diagnosis present

## 2018-04-13 DIAGNOSIS — E1069 Type 1 diabetes mellitus with other specified complication: Secondary | ICD-10-CM | POA: Diagnosis present

## 2018-04-13 DIAGNOSIS — R52 Pain, unspecified: Secondary | ICD-10-CM | POA: Diagnosis not present

## 2018-04-13 DIAGNOSIS — E785 Hyperlipidemia, unspecified: Secondary | ICD-10-CM | POA: Diagnosis present

## 2018-04-13 DIAGNOSIS — K3184 Gastroparesis: Secondary | ICD-10-CM | POA: Diagnosis present

## 2018-04-13 DIAGNOSIS — F418 Other specified anxiety disorders: Secondary | ICD-10-CM | POA: Diagnosis present

## 2018-04-13 DIAGNOSIS — R112 Nausea with vomiting, unspecified: Secondary | ICD-10-CM | POA: Diagnosis not present

## 2018-04-13 DIAGNOSIS — E1010 Type 1 diabetes mellitus with ketoacidosis without coma: Secondary | ICD-10-CM | POA: Diagnosis present

## 2018-04-13 DIAGNOSIS — I252 Old myocardial infarction: Secondary | ICD-10-CM

## 2018-04-13 DIAGNOSIS — F419 Anxiety disorder, unspecified: Secondary | ICD-10-CM

## 2018-04-13 DIAGNOSIS — K219 Gastro-esophageal reflux disease without esophagitis: Secondary | ICD-10-CM | POA: Diagnosis present

## 2018-04-13 DIAGNOSIS — Z79899 Other long term (current) drug therapy: Secondary | ICD-10-CM | POA: Diagnosis not present

## 2018-04-13 DIAGNOSIS — R109 Unspecified abdominal pain: Secondary | ICD-10-CM | POA: Diagnosis not present

## 2018-04-13 DIAGNOSIS — E10649 Type 1 diabetes mellitus with hypoglycemia without coma: Secondary | ICD-10-CM | POA: Diagnosis not present

## 2018-04-13 DIAGNOSIS — Z7982 Long term (current) use of aspirin: Secondary | ICD-10-CM | POA: Diagnosis not present

## 2018-04-13 DIAGNOSIS — E1043 Type 1 diabetes mellitus with diabetic autonomic (poly)neuropathy: Principal | ICD-10-CM | POA: Diagnosis present

## 2018-04-13 DIAGNOSIS — Z951 Presence of aortocoronary bypass graft: Secondary | ICD-10-CM

## 2018-04-13 DIAGNOSIS — Z794 Long term (current) use of insulin: Secondary | ICD-10-CM

## 2018-04-13 DIAGNOSIS — Z833 Family history of diabetes mellitus: Secondary | ICD-10-CM

## 2018-04-13 DIAGNOSIS — Z880 Allergy status to penicillin: Secondary | ICD-10-CM

## 2018-04-13 DIAGNOSIS — Z8349 Family history of other endocrine, nutritional and metabolic diseases: Secondary | ICD-10-CM

## 2018-04-13 DIAGNOSIS — E860 Dehydration: Secondary | ICD-10-CM | POA: Diagnosis not present

## 2018-04-13 DIAGNOSIS — Z888 Allergy status to other drugs, medicaments and biological substances status: Secondary | ICD-10-CM

## 2018-04-13 DIAGNOSIS — R1084 Generalized abdominal pain: Secondary | ICD-10-CM | POA: Diagnosis not present

## 2018-04-13 DIAGNOSIS — F32A Depression, unspecified: Secondary | ICD-10-CM | POA: Diagnosis present

## 2018-04-13 LAB — I-STAT TROPONIN, ED: TROPONIN I, POC: 0.01 ng/mL (ref 0.00–0.08)

## 2018-04-13 LAB — COMPREHENSIVE METABOLIC PANEL
ALK PHOS: 97 U/L (ref 38–126)
ALT: 29 U/L (ref 0–44)
AST: 21 U/L (ref 15–41)
Albumin: 3.8 g/dL (ref 3.5–5.0)
Anion gap: 13 (ref 5–15)
BUN: 16 mg/dL (ref 6–20)
CALCIUM: 9.1 mg/dL (ref 8.9–10.3)
CO2: 22 mmol/L (ref 22–32)
Chloride: 104 mmol/L (ref 98–111)
Creatinine, Ser: 1.13 mg/dL — ABNORMAL HIGH (ref 0.44–1.00)
GFR calc Af Amer: >60 mL/min (ref >60)
GFR calc non Af Amer: 56 mL/min — ABNORMAL LOW (ref >60)
GLUCOSE: 322 mg/dL — AB (ref 70–99)
Potassium: 4.2 mmol/L (ref 3.5–5.1)
SODIUM: 139 mmol/L (ref 135–145)
Total Bilirubin: 1.4 mg/dL — ABNORMAL HIGH (ref 0.3–1.2)
Total Protein: 7.6 g/dL (ref 6.5–8.1)

## 2018-04-13 LAB — CBC WITH DIFFERENTIAL/PLATELET
ABS IMMATURE GRANULOCYTES: 0.05 10*3/uL (ref 0.00–0.07)
BASOS ABS: 0 10*3/uL (ref 0.0–0.1)
Basophils Relative: 0 %
Eosinophils Absolute: 0 10*3/uL (ref 0.0–0.5)
Eosinophils Relative: 0 %
HCT: 40.2 % (ref 36.0–46.0)
HEMOGLOBIN: 12.8 g/dL (ref 12.0–15.0)
IMMATURE GRANULOCYTES: 0 %
LYMPHS ABS: 0.6 10*3/uL — AB (ref 0.7–4.0)
LYMPHS PCT: 6 %
MCH: 28.8 pg (ref 26.0–34.0)
MCHC: 31.8 g/dL (ref 30.0–36.0)
MCV: 90.3 fL (ref 80.0–100.0)
Monocytes Absolute: 0.4 10*3/uL (ref 0.1–1.0)
Monocytes Relative: 4 %
NEUTROS ABS: 10.5 10*3/uL — AB (ref 1.7–7.7)
NEUTROS PCT: 90 %
NRBC: 0 % (ref 0.0–0.2)
Platelets: 379 10*3/uL (ref 150–400)
RBC: 4.45 MIL/uL (ref 3.87–5.11)
RDW: 14.4 % (ref 11.5–15.5)
WBC: 11.7 10*3/uL — ABNORMAL HIGH (ref 4.0–10.5)

## 2018-04-13 LAB — BLOOD GAS, VENOUS
Acid-base deficit: 0.4 mmol/L (ref 0.0–2.0)
BICARBONATE: 22.9 mmol/L (ref 20.0–28.0)
Drawn by: 270211
O2 Saturation: 85.7 %
PATIENT TEMPERATURE: 98.6
PO2 VEN: 50.6 mmHg — AB (ref 32.0–45.0)
pCO2, Ven: 35.4 mmHg — ABNORMAL LOW (ref 44.0–60.0)
pH, Ven: 7.428 (ref 7.250–7.430)

## 2018-04-13 LAB — LACTIC ACID, PLASMA: LACTIC ACID, VENOUS: 1.3 mmol/L (ref 0.5–1.9)

## 2018-04-13 LAB — GLUCOSE, CAPILLARY: Glucose-Capillary: 227 mg/dL — ABNORMAL HIGH (ref 70–99)

## 2018-04-13 LAB — MAGNESIUM: Magnesium: 2.1 mg/dL (ref 1.7–2.4)

## 2018-04-13 LAB — PHOSPHORUS: Phosphorus: 2.6 mg/dL (ref 2.5–4.6)

## 2018-04-13 LAB — LIPASE, BLOOD: Lipase: 25 U/L (ref 11–51)

## 2018-04-13 MED ORDER — ACETAMINOPHEN 650 MG RE SUPP: 650.0000 mg | Freq: Four times a day (QID) | RECTAL | Status: DC | PRN

## 2018-04-13 MED ORDER — BUSPIRONE HCL 10 MG PO TABS
10.0000 mg | ORAL_TABLET | Freq: Three times a day (TID) | ORAL | Status: DC
  Administered 2018-04-13 – 2018-04-15 (×5): 10 mg via ORAL
  Filled 2018-04-13 (×5): qty 1

## 2018-04-13 MED ORDER — METOPROLOL TARTRATE 12.5 MG HALF TABLET
12.5000 mg | ORAL_TABLET | Freq: Two times a day (BID) | ORAL | Status: DC
  Administered 2018-04-13 – 2018-04-15 (×4): 12.5 mg via ORAL
  Filled 2018-04-13 (×4): qty 1

## 2018-04-13 MED ORDER — PYRIDOSTIGMINE BROMIDE 60 MG PO TABS
60.0000 mg | ORAL_TABLET | Freq: Three times a day (TID) | ORAL | Status: DC
  Administered 2018-04-13 – 2018-04-15 (×5): 60 mg via ORAL
  Filled 2018-04-13 (×6): qty 1

## 2018-04-13 MED ORDER — IPRATROPIUM BROMIDE 0.02 % IN SOLN: 0.5000 mg | Freq: Four times a day (QID) | RESPIRATORY_TRACT | Status: DC | PRN

## 2018-04-13 MED ORDER — SODIUM CHLORIDE 0.9 % IV BOLUS
1000.0000 mL | Freq: Once | INTRAVENOUS | Status: AC
  Administered 2018-04-13: 1000 mL via INTRAVENOUS

## 2018-04-13 MED ORDER — ENOXAPARIN SODIUM 40 MG/0.4ML ~~LOC~~ SOLN
40.0000 mg | SUBCUTANEOUS | Status: DC
  Administered 2018-04-13 – 2018-04-14 (×2): 40 mg via SUBCUTANEOUS
  Filled 2018-04-13 (×2): qty 0.4

## 2018-04-13 MED ORDER — FUROSEMIDE 40 MG PO TABS: 40.0000 mg | ORAL_TABLET | Freq: Every day | ORAL | Status: DC | PRN

## 2018-04-13 MED ORDER — METOCLOPRAMIDE HCL 5 MG/ML IJ SOLN
10.0000 mg | Freq: Once | INTRAMUSCULAR | Status: AC
  Administered 2018-04-13: 10 mg via INTRAVENOUS
  Filled 2018-04-13: qty 2

## 2018-04-13 MED ORDER — ONDANSETRON HCL 4 MG PO TABS: 4.0000 mg | ORAL_TABLET | Freq: Four times a day (QID) | ORAL | Status: DC | PRN

## 2018-04-13 MED ORDER — INSULIN ASPART 100 UNIT/ML ~~LOC~~ SOLN
0.0000 [IU] | SUBCUTANEOUS | Status: DC
  Administered 2018-04-13: 5 [IU] via SUBCUTANEOUS
  Administered 2018-04-14: 3 [IU] via SUBCUTANEOUS
  Administered 2018-04-14: 2 [IU] via SUBCUTANEOUS

## 2018-04-13 MED ORDER — ONDANSETRON HCL 4 MG/2ML IJ SOLN: 4.0000 mg | Freq: Four times a day (QID) | INTRAMUSCULAR | Status: DC | PRN

## 2018-04-13 MED ORDER — SODIUM CHLORIDE 0.9 % IV SOLN
INTRAVENOUS | Status: DC
  Administered 2018-04-13 – 2018-04-14 (×2): via INTRAVENOUS

## 2018-04-13 MED ORDER — ALBUTEROL SULFATE (2.5 MG/3ML) 0.083% IN NEBU: 2.5000 mg | INHALATION_SOLUTION | Freq: Four times a day (QID) | RESPIRATORY_TRACT | Status: DC | PRN

## 2018-04-13 MED ORDER — ATORVASTATIN CALCIUM 40 MG PO TABS
80.0000 mg | ORAL_TABLET | Freq: Every day | ORAL | Status: DC
  Administered 2018-04-14: 80 mg via ORAL
  Filled 2018-04-13: qty 2

## 2018-04-13 MED ORDER — ONDANSETRON HCL 4 MG/2ML IJ SOLN
4.0000 mg | Freq: Once | INTRAMUSCULAR | Status: AC
  Administered 2018-04-13: 4 mg via INTRAVENOUS
  Filled 2018-04-13: qty 2

## 2018-04-13 MED ORDER — ASPIRIN EC 325 MG PO TBEC
325.0000 mg | DELAYED_RELEASE_TABLET | Freq: Every day | ORAL | Status: DC
  Administered 2018-04-13 – 2018-04-15 (×3): 325 mg via ORAL
  Filled 2018-04-13 (×3): qty 1

## 2018-04-13 MED ORDER — ACETAMINOPHEN 325 MG PO TABS: 650.0000 mg | ORAL_TABLET | Freq: Four times a day (QID) | ORAL | Status: DC | PRN

## 2018-04-13 MED ORDER — METOCLOPRAMIDE HCL 5 MG/ML IJ SOLN: 5.0000 mg | Freq: Three times a day (TID) | INTRAMUSCULAR | Status: DC | PRN

## 2018-04-13 MED ORDER — GABAPENTIN 300 MG PO CAPS
600.0000 mg | ORAL_CAPSULE | Freq: Two times a day (BID) | ORAL | Status: DC
  Administered 2018-04-13 – 2018-04-15 (×4): 600 mg via ORAL
  Filled 2018-04-13 (×4): qty 2

## 2018-04-13 MED ORDER — DIPHENHYDRAMINE HCL 50 MG/ML IJ SOLN
25.0000 mg | Freq: Once | INTRAMUSCULAR | Status: AC
  Administered 2018-04-13: 25 mg via INTRAVENOUS
  Filled 2018-04-13: qty 1

## 2018-04-13 MED ORDER — FAMOTIDINE IN NACL 20-0.9 MG/50ML-% IV SOLN
20.0000 mg | Freq: Two times a day (BID) | INTRAVENOUS | Status: DC
  Administered 2018-04-13 – 2018-04-14 (×2): 20 mg via INTRAVENOUS
  Filled 2018-04-13 (×2): qty 50

## 2018-04-13 NOTE — ED Notes (Signed)
Bed: HD89 Expected date:  Expected time:  Means of arrival:  Comments: Ems N/V

## 2018-04-13 NOTE — ED Notes (Signed)
ED TO INPATIENT HANDOFF REPORT  Name/Age/Gender [name] 52 y.o. female  Code Status Code Status History    Date Active Date Inactive Code Status Order ID Comments User Context   03/31/2018 1707 04/01/2018 2244 Full Code [id number]  Karmen Bongo, MD ED   02/19/2018 0232 02/21/2018 1720 Full Code [id number]  Vianne Bulls, MD ED   02/14/2018 1413 02/16/2018 1659 Full Code [id number]  Rondel Jumbo, PA-C ED   01/04/2018 1515 01/25/2018 1402 Full Code [id number]  Cristal Ford, DO Inpatient   12/23/2017 1753 12/26/2017 1605 Full Code [id number]  Damita Lack, MD ED   12/10/2017 1835 12/15/2017 1708 Full Code [id number]  Georgette Shell, MD ED   12/10/2017 1819 12/10/2017 1835 Full Code [id number]  Georgette Shell, MD ED   11/22/2017 1934 11/25/2017 2244 Full Code [id number]  Rise Patience, MD ED   11/05/2017 1835 11/11/2017 2134 Full Code [id number]  Hosie Poisson, MD Inpatient   07/10/2017 0751 07/12/2017 2045 Full Code [id number]  Roxan Hockey, MD Inpatient   06/24/2017 0139 06/25/2017 1749 Full Code [id number]  Etta Quill, DO ED   06/16/2017 0324 06/17/2017 1738 Full Code [id number]  Elwyn Reach, MD Inpatient   06/11/2017 0324 06/12/2017 2030 Full Code [id number]  Bonnielee Haff, MD ED   05/16/2017 1756 05/18/2017 1832 Full Code [id number]  Geradine Girt, DO Inpatient   05/06/2017 1130 05/09/2017 2049 Full Code [id number]  Donne Hazel, MD ED   03/25/2017 0742 04/04/2017 0144 Full Code [id number]  Kandice Hams, MD Inpatient   03/25/2017 0653 03/25/2017 0742 Full Code [id number]  Kandice Hams, MD Inpatient   03/16/2017 0606 03/20/2017 2040 Full Code [id number]  Jani Gravel, MD Inpatient   03/05/2017 1836 03/09/2017 1626 Full Code [id number]  Bonnielee Haff, MD Inpatient   02/05/2017 1118 02/06/2017 1706 Full Code [id number]  Merton Border, MD Inpatient   01/31/2017 2122 02/03/2017 1847 Full Code [id number]  Vashti Hey, MD Inpatient   09/27/2016 1833 09/29/2016  2030 Full Code [id number]  Doreatha Lew, MD Inpatient   04/20/2016 1809 04/23/2016 1937 Full Code [id number]  Hosie Poisson, MD Inpatient   04/06/2016 1350 04/09/2016 1829 Full Code [id number]  Elgergawy, Silver Huguenin, MD ED   03/23/2016 0122 03/26/2016 2101 Full Code [id number]  Vianne Bulls, MD ED   03/18/2016 1559 03/20/2016 1741 Full Code [id number]  Mariel Aloe, MD Inpatient   02/19/2016 1416 02/21/2016 1732 Full Code [id number]  Caren Griffins, MD ED   01/24/2016 0701 01/26/2016 2001 Full Code [id number]  Karmen Bongo, MD Inpatient   01/19/2016 1423 01/21/2016 1738 Full Code [id number]  Thurnell Lose, MD ED   11/27/2015 2201 12/01/2015 1839 Full Code [id number]  Toy Baker, MD Inpatient   11/14/2015 0405 11/16/2015 1931 Full Code [id number]  Ivor Costa, MD ED   09/02/2015 1233 09/05/2015 2212 Full Code [id number]  Kelvin Cellar, MD ED   07/29/2015 0755 08/01/2015 2303 Full Code [id number]  Reyne Dumas, MD ED   06/29/2015 2027 07/03/2015 1758 Full Code [id number]  Robbie Lis, MD Inpatient   06/22/2015 1058 06/25/2015 1847 Full Code [id number]  Radene Gunning, NP Inpatient   06/14/2015 1829 06/20/2015 2140 Full Code [id number]  Domenic Polite, MD Inpatient   05/26/2015 2202 06/05/2015 1929 Full Code [id number]  Reubin Milan, MD Inpatient   05/23/2015 0717 05/24/2015 1819 Full Code [id number]  Norval Morton, MD Inpatient   05/17/2015 1221 05/20/2015 1813 Full Code [id number]  Irine Seal  V, MD Inpatient   05/17/2015 1109 05/17/2015 1221 Full Code [id number]  Eugenie Filler, MD ED   04/19/2015 0238 04/20/2015 2229 Full Code [id number]  Rise Patience, MD Inpatient   04/05/2015 0543 04/09/2015 1925 Full Code [id number]  Rise Patience, MD Inpatient   04/05/2015 0537 04/05/2015 0543 Full Code [id number]  Despina Hick, RN Inpatient   03/09/2015 0742 03/12/2015 1911 Full Code [id number]  Theodis Blaze, MD Inpatient   02/20/2015 0108 02/22/2015 1908 Full Code [id number]  Toy Baker, MD Inpatient   02/08/2015 2343 02/13/2015 1532 Full Code [id number]  Lavina Hamman, MD ED   10/21/2014 0057 10/23/2014 1514 Full Code [id number]  Rise Patience, MD Inpatient   02/12/2013 2316 02/17/2013 2122 Full Code [id number]  Quintella Baton, MD Inpatient   11/25/2012 1348 11/28/2012 1904 Full Code [id number]  Elmarie Shiley, MD ED   11/05/2012 1737 11/07/2012 1620 Full Code [id number]  Sheila Oats, MD Inpatient   04/03/2012 0227 04/05/2012 1643 Full Code [id number]  Quintella Baton, MD Inpatient   03/24/2012 0021 03/28/2012 1751 Full Code [id number]  Leeroy Cha, RN ED      Home/SNF/Other Home  Chief Complaint Nausea / Vomitting   Level of Care/Admitting Diagnosis ED Disposition    ED Disposition Condition [facility]: [facility] [[id number]]  Level of Care: Med-Surg [16]  Diagnosis: Gastroparesis [536.3.ICD-9-CM]  Admitting Physician: Harvie Bridge [[id number]]  Attending Physician: Harvie Bridge [[id number]]  Estimated length of stay: past midnight tomorrow  Certification:: I certify this patient will need inpatient services for at least 2 midnights  PT Class (Do Not Modify): Inpatient [101]  PT Acc Code (Do Not Modify): Private [1]       Medical History Past Medical History:  Diagnosis Date  . AKI (acute kidney injury) ([place])   . Anemia, iron deficiency   . Anxiety and depression 05/18/2015  . CAD (coronary artery disease), native coronary artery 01/11/2018   Non-STEMI with severe three-vessel disease noted July 2019  . Depression   . Diabetes mellitus without complication ([place])   . Diabetic gastroparesis ([place])   . Diabetic neuropathy, type I diabetes mellitus ([place]) 05/18/2015  . DKA (diabetic ketoacidoses) ([place]) 06/16/2017  . DKA, type 1 ([place]) 03/18/2016  . Essential hypertension   . Gastroparesis   . GERD (gastroesophageal reflux disease)   . HLD (hyperlipidemia)   . MDD (major depressive disorder),  single episode, severe , no psychosis ([place])   . Non-ST elevation (NSTEMI) myocardial infarction ([place])   . S/P CABG x 3 01/14/2018  . Tardive dyskinesia   . Vitamin B12 deficiency 08/16/2015    Allergies Allergies  Allergen Reactions  . Anesthetics, Amide Nausea And Vomiting  . Penicillins Diarrhea, Nausea And Vomiting and Other (See Comments)    Has patient had a PCN reaction causing immediate rash, facial/tongue/throat swelling, SOB or lightheadedness with hypotension: Yes Has patient had a PCN reaction causing severe rash involving mucus membranes or skin necrosis: No Has patient had a PCN reaction that required hospitalization No Has patient had a PCN reaction occurring within the last 10 years: Yes  If all of the above answers are "NO", then may proceed with Cephalosporin use.   . Buprenorphine Hcl Rash  . Encainide Nausea And Vomiting  . Metoclopramide Other (See Comments)    Dystonia, muscle rigidity.  Patient states she is only allergic to the "pill form, not the IV"    IV Location/Drains/Wounds  Patient Lines/Drains/Airways Status   Active Line/Drains/Airways    Name:   Placement date:   Placement time:   Site:   Days:   CVC Triple Lumen 04/13/18 Left Femoral 20 cm 0 cm   04/13/18    1729     less than 1   Incision (Closed) 01/14/18 Chest Mid   01/14/18    0855     89   Incision (Closed) 01/14/18 Leg Right   01/14/18    0855     89   Incision (Closed) 01/14/18 Leg Left   01/14/18    1014     89          Labs/Imaging Results for orders placed or performed during the hospital encounter of 04/13/18 (from the past 48 hour(s))  CBC with Differential     Status: Abnormal   Collection Time: 04/13/18  5:25 PM  Result Value Ref Range   WBC 11.7 (H) 4.0 - 10.5 K/uL   RBC 4.45 3.87 - 5.11 MIL/uL   Hemoglobin 12.8 12.0 - 15.0 g/dL   HCT 40.2 36.0 - 46.0 %   MCV 90.3 80.0 - 100.0 fL   MCH 28.8 26.0 - 34.0 pg   MCHC 31.8 30.0 - 36.0 g/dL   RDW 14.4 11.5 - 15.5 %   Platelets  379 150 - 400 K/uL   nRBC 0.0 0.0 - 0.2 %   Neutrophils Relative % 90 %   Neutro Abs 10.5 (H) 1.7 - 7.7 K/uL   Lymphocytes Relative 6 %   Lymphs Abs 0.6 (L) 0.7 - 4.0 K/uL   Monocytes Relative 4 %   Monocytes Absolute 0.4 0.1 - 1.0 K/uL   Eosinophils Relative 0 %   Eosinophils Absolute 0.0 0.0 - 0.5 K/uL   Basophils Relative 0 %   Basophils Absolute 0.0 0.0 - 0.1 K/uL   RBC Morphology CRENATED RBCs    Immature Granulocytes 0 %   Abs Immature Granulocytes 0.05 0.00 - 0.07 K/uL    Comment: Performed at [facility], [place] [address], [place], [place] [address]  Comprehensive metabolic panel     Status: Abnormal   Collection Time: 04/13/18  5:25 PM  Result Value Ref Range   Sodium 139 135 - 145 mmol/L   Potassium 4.2 3.5 - 5.1 mmol/L   Chloride 104 98 - 111 mmol/L   CO2 22 22 - 32 mmol/L   Glucose, Bld 322 (H) 70 - 99 mg/dL   BUN 16 6 - 20 mg/dL   Creatinine, Ser 1.13 (H) 0.44 - 1.00 mg/dL   Calcium 9.1 8.9 - 10.3 mg/dL   Total Protein 7.6 6.5 - 8.1 g/dL   Albumin 3.8 3.5 - 5.0 g/dL   AST 21 15 - 41 U/L   ALT 29 0 - 44 U/L   Alkaline Phosphatase 97 38 - 126 U/L   Total Bilirubin 1.4 (H) 0.3 - 1.2 mg/dL   GFR calc non Af Amer 56 (L) >60 mL/min   GFR calc Af Amer >60 >60 mL/min    Comment: (NOTE) The eGFR has been calculated using the CKD EPI equation. This calculation has not been validated in all clinical situations. eGFR's persistently <60 mL/min signify possible Chronic Kidney Disease.    Anion gap 13 5 - 15    Comment: Performed at [facility], [place] [address] [place], [place] [address]  Lipase, blood     Status: None   Collection Time: 04/13/18  5:25 PM  Result  Value Ref Range   Lipase 25 11 - 51 U/L    Comment: Performed at [facility], [place] [address] [place], [place] [address]  Blood gas, venous     Status: Abnormal   Collection Time: 04/13/18  5:30 PM  Result Value Ref Range   pH, Ven 7.428 7.250 - 7.430    pCO2, Ven 35.4 (L) 44.0 - 60.0 mmHg   pO2, Ven 50.6 (H) 32.0 - 45.0 mmHg   Bicarbonate 22.9 20.0 - 28.0 mmol/L   Acid-base deficit 0.4 0.0 - 2.0 mmol/L   O2 Saturation 85.7 %   Patient temperature 98.6    Collection site VEIN    Drawn by [id number]    Sample type VENOUS     Comment: Performed at [facility], [place] [address] [place], [place] [address]  Lactic acid, plasma     Status: None   Collection Time: 04/13/18  6:00 PM  Result Value Ref Range   Lactic Acid, Venous 1.3 0.5 - 1.9 mmol/L    Comment: Performed at [facility], [place] [address], [place], [place] [address]  I-Stat Troponin, ED (not at [facility])     Status: None   Collection Time: 04/13/18  8:30 PM  Result Value Ref Range   Troponin i, poc 0.01 0.00 - 0.08 ng/mL   Comment 3            Comment: Due to the release kinetics of cTnI, a negative result within the first hours of the onset of symptoms does not rule out myocardial infarction with certainty. If myocardial infarction is still suspected, repeat the test at appropriate intervals.    No results found.  Pending Labs Unresulted Labs (From admission, onward)    Start     Ordered   04/13/18 1547  Urinalysis, Routine w reflex microscopic  (ED Abdominal Pain)  STAT,   STAT     04/13/18 1546   Signed and Held  CBC  (enoxaparin (LOVENOX)    CrCl >/= 30 ml/min)  Once,   R    Comments:  Baseline for enoxaparin therapy IF NOT ALREADY DRAWN.  Notify MD if PLT < 100 K.    Signed and Held   Signed and Held  Creatinine, serum  (enoxaparin (LOVENOX)    CrCl >/= 30 ml/min)  Once,   R    Comments:  Baseline for enoxaparin therapy IF NOT ALREADY DRAWN.    Signed and Held   Signed and Held  Creatinine, serum  (enoxaparin (LOVENOX)    CrCl >/= 30 ml/min)  Weekly,   R    Comments:  while on enoxaparin therapy    Signed and Held   Signed and Held  Magnesium  Add-on,   R     Signed and Held   Signed and Held  Phosphorus  Add-on,   R      Signed and Held   Signed and Held  CBC  Tomorrow morning,   R     Signed and Held   Signed and Held  Comprehensive metabolic panel  Tomorrow morning,   R     Signed and Held          Vitals/Pain Today's Vitals   04/13/18 1930 04/13/18 2000 04/13/18 2030 04/13/18 2100  BP: (!) 115/58 119/65 138/74 135/62  Pulse:    (!) 103  Resp:    20  Temp:      TempSrc:      SpO2:  97%  Weight:      Height:      PainSc:        Isolation Precautions No active isolations  Medications Medications  sodium chloride 0.9 % bolus 1,000 mL (1,000 mLs Intravenous New Bag/Given 04/13/18 2022)  ondansetron (ZOFRAN) injection 4 mg (4 mg Intravenous Given 04/13/18 1740)  diphenhydrAMINE (BENADRYL) injection 25 mg (25 mg Intravenous Given 04/13/18 1740)  sodium chloride 0.9 % bolus 1,000 mL (0 mLs Intravenous Stopped 04/13/18 2022)  metoCLOPramide (REGLAN) injection 10 mg (10 mg Intravenous Given 04/13/18 1816)  ondansetron (ZOFRAN) injection 4 mg (4 mg Intravenous Given 04/13/18 2014)    Mobility walks with person assist

## 2018-04-13 NOTE — ED Notes (Signed)
Bed: WA18 Expected date:  Expected time:  Means of arrival:  Comments: 

## 2018-04-13 NOTE — ED Provider Notes (Signed)
[facility] DEPT Provider Note  CSN: [id number] Arrival date & time: 04/13/18  1522  History   Chief Complaint Chief Complaint  Patient presents with  . Nausea  . Emesis   HPI [name] is a 52 y.o. female with a medical history of NSTEMI s/p CABG x3, Type 1 DM, HTN, gastroparesis and GERD who presented to the ED for abdominal pain and nausea/vomiting x1 day. Vomiting described as bilious and continuous. Patient states that she has been compliant with her insulin regimen. Denies recent illness. She states that this is occurring because of her gastroparesis. Patient has not tried any intervention for symptoms prior to coming to the ED. Denies fever, chills, change in bowel habits, GU complaints or myalgias.  Additional history obtained by medical chart. She has had 8 hospitalizations this year for DKA. The most recent was from 03/31/18-04/01/18. Patient has established GI care with [facility], but states she is unable to follow-up appropriately because of transportation issues.  HPI  Past Medical History:  Diagnosis Date  . AKI (acute kidney injury) ([place])   . Anemia, iron deficiency   . Anxiety and depression 05/18/2015  . CAD (coronary artery disease), native coronary artery 01/11/2018   Non-STEMI with severe three-vessel disease noted July 2019  . Depression   . Diabetes mellitus without complication ([place])   . Diabetic gastroparesis ([place])   . Diabetic neuropathy, type I diabetes mellitus ([place]) 05/18/2015  . DKA (diabetic ketoacidoses) ([place]) 06/16/2017  . DKA, type 1 ([place]) 03/18/2016  . Essential hypertension   . Gastroparesis   . GERD (gastroesophageal reflux disease)   . HLD (hyperlipidemia)   . MDD (major depressive disorder), single episode, severe , no psychosis ([place])   . Non-ST elevation (NSTEMI) myocardial infarction ([place])   . S/P CABG x 3 01/14/2018  . Tardive dyskinesia   . Vitamin B12 deficiency 08/16/2015    Patient Active Problem List   Diagnosis Date Noted  . DKA (diabetic ketoacidosis) ([place]) 03/31/2018  . S/P CABG x 3 01/14/2018  . CAD (coronary artery disease), native coronary artery 01/11/2018  . Dehydration   . MDD (major depressive disorder), single episode, severe , no psychosis ([place])   . DKA (diabetic ketoacidoses) ([place]) 06/16/2017  . Tardive dyskinesia   . DKA, type 1 ([place]) 03/18/2016  . Noncompliance with treatment plan 03/13/2016  . Essential hypertension 01/24/2016  . HLD (hyperlipidemia)   . GERD (gastroesophageal reflux disease)   . AKI (acute kidney injury) ([place])   . Anemia, iron deficiency   . Vitamin B12 deficiency 08/16/2015  . Diabetic gastroparesis ([place])   . Diabetic neuropathy, type I diabetes mellitus ([place]) 05/18/2015  . Anxiety and depression 05/18/2015    Past Surgical History:  Procedure Laterality Date  . CARDIAC SURGERY    . COLONOSCOPY  09/27/2014   at [facility]  . CORONARY ARTERY BYPASS GRAFT N/A 01/14/2018   Procedure: CORONARY ARTERY BYPASS GRAFTING (CABG)X3, RIGHT AND LEFT SAPHENOUS VEIN HARVEST, MAMMARY TAKE DOWN. MAMMARY TO LAD, SVG TO PD, SVG TO DISTAL CIRC.;  Surgeon: Grace Isaac, MD;  Location: [facility];  Service: Open Heart Surgery;  Laterality: N/A;  . ESOPHAGOGASTRODUODENOSCOPY  09/27/2014   at [facility], Dr Rolan Lipa. biospy neg for celiac, neg for H pylori.   . EYE SURGERY    . gailstones    . IR FLUORO GUIDE CV LINE RIGHT  02/01/2017  . IR FLUORO GUIDE CV LINE RIGHT  03/06/2017  . IR FLUORO GUIDE CV LINE RIGHT  03/25/2017  . IR GENERIC HISTORICAL  01/24/2016   IR FLUORO GUIDE CV LINE RIGHT 01/24/2016 Darrell K Allred, PA-C [facility]  . IR GENERIC HISTORICAL  01/24/2016   IR US GUIDE VASC ACCESS RIGHT 01/24/2016 Darrell K Allred, PA-C [facility]  . IR US GUIDE VASC ACCESS RIGHT  02/01/2017  . IR US GUIDE VASC ACCESS RIGHT  03/06/2017  . IR US GUIDE VASC ACCESS RIGHT  03/25/2017  . LEFT HEART CATH AND CORONARY ANGIOGRAPHY N/A 01/07/2018   Procedure: LEFT HEART CATH AND  CORONARY ANGIOGRAPHY;  Surgeon: Troy Sine, MD;  Location: [facility];  Service: Cardiovascular;  Laterality: N/A;  . POSTERIOR VITRECTOMY AND MEMBRANE PEEL-LEFT EYE  09/28/2002  . POSTERIOR VITRECTOMY AND MEMBRANE PEEL-RIGHT EYE  03/16/2002  . RETINAL DETACHMENT SURGERY    . TEE WITHOUT CARDIOVERSION N/A 01/14/2018   Procedure: TRANSESOPHAGEAL ECHOCARDIOGRAM (TEE);  Surgeon: Grace Isaac, MD;  Location: [place] [facility];  Service: Open Heart Surgery;  Laterality: N/A;     OB History   None      Home Medications    Prior to Admission medications   Medication Sig Start Date End Date Taking? Authorizing Provider  acetaminophen (TYLENOL) 325 MG tablet Take 2 tablets (650 mg total) by mouth every 6 (six) hours as needed for mild pain. 01/25/18   Elgie Collard, PA-C  aspirin EC 325 MG EC tablet Take 1 tablet (325 mg total) by mouth daily. 01/25/18   Elgie Collard, PA-C  atorvastatin (LIPITOR) 80 MG tablet Take 1 tablet (80 mg total) by mouth daily at 6 PM. 03/20/18   [name], MD  Blood Glucose Monitoring Suppl (ONETOUCH VERIO) w/Device KIT Use as directed 03/21/18   [name], MD  busPIRone (BUSPAR) 10 MG tablet Take 1 tablet (10 mg total) by mouth 3 (three) times daily. 03/20/18   [name], MD  diphenhydrAMINE (BENADRYL) 25 mg capsule Take 25 mg by mouth every 6 (six) hours as needed for allergies.    [provider]  docusate sodium (COLACE) 100 MG capsule Take 1 capsule (100 mg total) by mouth daily as needed for mild constipation. 02/03/18   Burtis Junes, NP  furosemide (LASIX) 40 MG tablet Take 1 tablet (40 mg total) by mouth daily. 03/20/18 03/15/19  [name], MD  gabapentin (NEURONTIN) 600 MG tablet Take 600 mg by mouth 2 (two) times daily.    [provider]  glucose blood test strip Use as instructed 03/21/18   [name], MD  guaiFENesin (MUCINEX) 600 MG 12 hr tablet Take 600 mg by mouth 2 (two) times daily.    [provider]  insulin aspart (NOVOLOG) 100 UNIT/ML injection Inject 0-12 Units into the skin 4 (four) times daily -  before meals and at bedtime. Uses a sliding scale: 70-119 = 0 units, 120-150 = 2 units, 151-180 =  4 units, 181-210 = 5 units, 211-240 = 6 units, 241-270 = 8 units, 271-300 = 9 units, 301-330 = 10 units, 331-360 = 11 units, 361-400 = 12 units, greater than 400 notify MD.    [provider]  insulin detemir (LEVEMIR) 100 UNIT/ML injection Inject 0.15 mLs (15 Units total) into the skin every morning. 03/20/18   [name], MD  Insulin Syringe-Needle U-100 (BD INSULIN SYRINGE ULTRAFINE) 31G X 15/64" 0.5 ML MISC 1 each by Does not apply route 4 (four) times daily. 03/20/18   [name], MD  Lancets (ONETOUCH DELICA PLUS [id number]) MISC 1  Units by Does not apply route as directed. 03/21/18   [name], MD  metoprolol tartrate (LOPRESSOR) 25 MG tablet Take 0.5 tablets (12.5 mg total) by mouth 2 (two) times daily. 03/20/18   [name], MD  pantoprazole (PROTONIX) 40 MG tablet Take 1 tablet (40 mg total) by mouth daily. 03/20/18   [name], MD  potassium chloride (K-DUR) 10 MEQ tablet Take 1 tablet (10 mEq total) by mouth daily. 03/20/18   [name], MD  promethazine (PHENERGAN) 12.5 MG tablet Take 1 tablet (12.5 mg total) by mouth every 6 (six) hours as needed for nausea or vomiting. 03/20/18   [name], MD  pyridostigmine (MESTINON) 60 MG tablet Take 60 mg by mouth 3 (three) times daily.    [provider]    Family History Family History  Problem Relation Age of Onset  . Cystic fibrosis Mother   . Hypertension Father   . Diabetes Brother   . Hypertension Maternal Grandmother     Social History Social History   Tobacco Use  . Smoking status: Never Smoker  . Smokeless tobacco: Never Used  Substance Use Topics  . Alcohol use: No  . Drug use: No     Allergies   Anesthetics, amide; Penicillins; Buprenorphine hcl; Encainide; and  Metoclopramide   Review of Systems Review of Systems  Constitutional: Negative for chills and fever.  Respiratory: Negative.   Cardiovascular: Negative.   Gastrointestinal: Positive for abdominal pain, nausea and vomiting. Negative for blood in stool, constipation and diarrhea.  Genitourinary: Negative.   Musculoskeletal: Negative.   Skin: Negative.   All other systems reviewed and are negative.   Physical Exam Updated Vital Signs BP (!) 147/71 (BP Location: Right Arm)   Pulse 86   Temp 99.1 F (37.3 C) (Oral)   Resp 20   SpO2 94%   Physical Exam  Constitutional: She is cooperative. She appears ill.  Neck: Normal range of motion and full passive range of motion without pain. Neck supple.  Cardiovascular: Normal rate, regular rhythm, normal heart sounds and intact distal pulses.  Pulmonary/Chest: Breath sounds normal. Tachypnea noted. No respiratory distress.  Speaks softly. Increased work of breath and deep respirations.  Abdominal: Soft. Normal appearance and bowel sounds are normal. She exhibits no distension. There is generalized tenderness.  Musculoskeletal: Normal range of motion.  Neurological: She is alert.  Skin: Skin is warm. Capillary refill takes less than 2 seconds. No rash noted.  Nursing note and vitals reviewed.  ED Treatments / Results  Labs (all labs ordered are listed, but only abnormal results are displayed) Labs Reviewed  CBC WITH DIFFERENTIAL/PLATELET  COMPREHENSIVE METABOLIC PANEL  LIPASE, BLOOD  URINALYSIS, ROUTINE W REFLEX MICROSCOPIC    EKG None  Radiology No results found.  Procedures Procedures (including critical care time)  Medications Ordered in ED Medications - No data to display   Initial Impression / Assessment and Plan / ED Course  Triage vital signs and the nursing notes have been reviewed.  Pertinent labs & imaging results that were available during care of the patient were reviewed and considered in medical decision  making (see chart for details).  Patient presents to the ED with complaints of gradual worsening of abdominal pain and N/V. She is a Type 1 diabetic who has several admissions this calendar year for DKA. On exam, she has increased work of breathing and appears in discomfort and has generalized abdominal tenderness. Patient likely in DKA given history and current clinical presentation. Will order  necessary labs to evaluate for this. Patient has well-documented history of difficult venous access and required central line placement when she was last in the ED on 03/31/18.   Clinical Course as of Apr 13 1952  Nancy Fetter Apr 13, 2018  1642 RN alerted this provider that multiple IV sites were attempted via ultrasound without success. Dr. Tamera Punt attempted to place EJ also without success. Patient agreed to have central line placed in the groin for access.   [GM]  53 Dr. Tamera Punt was able to successfully place a central line in the right groin.   [GM]  [place] resulted and did not show DKA. However, despite multiple anti-emetics and IV fluids. Patient continues to have severe nausea and active vomiting. Respirations increased to 30. Patient unable to tolerate PO items. Will consult [facility] for admission for N/V. Will also discuss the necessity of a port placement given pt's poor venous access and frequent hospitalizations that have required central lines.   [GM]  1938 Case discussed with [facility], Dr. Ara Kussmaul, who will admit the patient. Discussed about port placement during this admission for the patient.   [GM]    Clinical Course User Index [GM] Vianne Grieshop, Jonelle Sports, PA-C   Final Clinical Impressions(s) / ED Diagnoses  1. Nausea/Vomiting. Given multiple IV antiemetics in the ED with minimal relief. Likely associated with gastroparesis which patient has poor follow-up for. Case discussed with [facility] who will admit patient.  Dispo: Admit. Case discussed with [facility].  Final diagnoses:  Intractable vomiting with nausea, unspecified vomiting type    ED Discharge Orders    None         Junita Push 04/13/18 1955    Malvin Johns, MD 04/13/18 2324

## 2018-04-13 NOTE — H&P (Signed)
History and Physical   [facility] [place] [facility] [place] Admission History and Physical [employer], D.O.    Patient Name: [name] MR#: [id number] Date of Birth: [birth year] Date of Admission: 04/13/2018  Referring MD/NP/PA: Maury Dus   Primary Care Physician: [name], MD  Chief Complaint:  Chief Complaint  Patient presents with  . Nausea  . Emesis    HPI: [name] is a 52 y.o. female with a known history of coronary artery disease status post non-STEMI and CABG 5 months ago, insulin-dependent diabetes, hypertension, GERD, gastroparesis, anxiety and depression, tardive dyskinesia presents to the emergency department for evaluation of nausea and vomiting.  Patient was in a usual state of health until possibly 1 day prior to arrival she describes the onset of intractable nausea associated with bilious vomiting.  She also reports constipation for which she usually takes stool softeners.  She reports muscle cramps, decreased p.o. intake and decreased appetite for the past day  Patient also complains of right knee pain which is different for her.   Of note patient has had multiple admissions to this hospital for DKA but states that she is compliant with her medication regimen.  Patient denies fevers/chills, weakness, dizziness, chest pain, shortness of breath, N/V/C/D, abdominal pain, dysuria/frequency, changes in mental status.    Otherwise there has been no change in status. Patient has been taking medication as prescribed and there has been no recent change in medication or diet.  No recent antibiotics.  There has been no recent illness, hospitalizations, travel or sick contacts.    EMS/ED Course: Patient received Zofran and Reglan with mild improvement. Medical admission has been requested for further management of diabetic gastroparesis.  Review of Systems:  CONSTITUTIONAL: No fever/chills, fatigue, weakness, weight gain/loss,  headache. EYES: No blurry or double vision. ENT: No tinnitus, postnasal drip, redness or soreness of the oropharynx. RESPIRATORY: No cough, dyspnea, wheeze.  No hemoptysis.  CARDIOVASCULAR: No chest pain, palpitations, syncope, orthopnea. No lower extremity edema.  GASTROINTESTINAL: Positive nausea, vomiting, constipation.  Negative abdominal pain, diarrhea,   No hematemesis, melena or hematochezia. GENITOURINARY: No dysuria, frequency, hematuria. ENDOCRINE: No polyuria or nocturia. No heat or cold intolerance. HEMATOLOGY: No anemia, bruising, bleeding. INTEGUMENTARY: No rashes, ulcers, lesions. MUSCULOSKELETAL: No arthritis, gout, dyspnea. NEUROLOGIC: No numbness, tingling, ataxia, seizure-type activity, weakness. PSYCHIATRIC: No anxiety, depression, insomnia.   Past Medical History:  Diagnosis Date  . AKI (acute kidney injury) ([place])   . Anemia, iron deficiency   . Anxiety and depression 05/18/2015  . CAD (coronary artery disease), native coronary artery 01/11/2018   Non-STEMI with severe three-vessel disease noted July 2019  . Depression   . Diabetes mellitus without complication ([place])   . Diabetic gastroparesis ([place])   . Diabetic neuropathy, type I diabetes mellitus ([place]) 05/18/2015  . DKA (diabetic ketoacidoses) ([place]) 06/16/2017  . DKA, type 1 ([place]) 03/18/2016  . Essential hypertension   . Gastroparesis   . GERD (gastroesophageal reflux disease)   . HLD (hyperlipidemia)   . MDD (major depressive disorder), single episode, severe , no psychosis ([place])   . Non-ST elevation (NSTEMI) myocardial infarction ([place])   . S/P CABG x 3 01/14/2018  . Tardive dyskinesia   . Vitamin B12 deficiency 08/16/2015    Past Surgical History:  Procedure Laterality Date  . CARDIAC SURGERY    . COLONOSCOPY  09/27/2014   at [facility]  . CORONARY ARTERY BYPASS GRAFT N/A 01/14/2018   Procedure: CORONARY ARTERY BYPASS GRAFTING (CABG)X3, RIGHT AND  LEFT SAPHENOUS VEIN HARVEST, MAMMARY TAKE DOWN. MAMMARY TO LAD,  SVG TO PD, SVG TO DISTAL CIRC.;  Surgeon: Grace Isaac, MD;  Location: [place];  Service: Open Heart Surgery;  Laterality: N/A;  . ESOPHAGOGASTRODUODENOSCOPY  09/27/2014   at [facility], Dr Rolan Lipa. biospy neg for celiac, neg for H pylori.   . EYE SURGERY    . gailstones    . IR FLUORO GUIDE CV LINE RIGHT  02/01/2017  . IR FLUORO GUIDE CV LINE RIGHT  03/06/2017  . IR FLUORO GUIDE CV LINE RIGHT  03/25/2017  . IR GENERIC HISTORICAL  01/24/2016   IR FLUORO GUIDE CV LINE RIGHT 01/24/2016 Darrell K Allred, PA-C [facility]  . IR GENERIC HISTORICAL  01/24/2016   IR US GUIDE VASC ACCESS RIGHT 01/24/2016 Darrell K Allred, PA-C [facility]  . IR US GUIDE VASC ACCESS RIGHT  02/01/2017  . IR US GUIDE VASC ACCESS RIGHT  03/06/2017  . IR US GUIDE VASC ACCESS RIGHT  03/25/2017  . LEFT HEART CATH AND CORONARY ANGIOGRAPHY N/A 01/07/2018   Procedure: LEFT HEART CATH AND CORONARY ANGIOGRAPHY;  Surgeon: Troy Sine, MD;  Location: [place] CV LAB;  Service: Cardiovascular;  Laterality: N/A;  . POSTERIOR VITRECTOMY AND MEMBRANE PEEL-LEFT EYE  09/28/2002  . POSTERIOR VITRECTOMY AND MEMBRANE PEEL-RIGHT EYE  03/16/2002  . RETINAL DETACHMENT SURGERY    . TEE WITHOUT CARDIOVERSION N/A 01/14/2018   Procedure: TRANSESOPHAGEAL ECHOCARDIOGRAM (TEE);  Surgeon: Grace Isaac, MD;  Location: [place];  Service: Open Heart Surgery;  Laterality: N/A;     reports that she has never smoked. She has never used smokeless tobacco. She reports that she does not drink alcohol or use drugs.  Allergies  Allergen Reactions  . Anesthetics, Amide Nausea And Vomiting  . Penicillins Diarrhea, Nausea And Vomiting and Other (See Comments)    Has patient had a PCN reaction causing immediate rash, facial/tongue/throat swelling, SOB or lightheadedness with hypotension: Yes Has patient had a PCN reaction causing severe rash involving mucus membranes or skin necrosis: No Has patient had a PCN reaction that required hospitalization  No Has patient had a PCN reaction occurring within the last 10 years: Yes  If all of the above answers are "NO", then may proceed with Cephalosporin use.   . Buprenorphine Hcl Rash  . Encainide Nausea And Vomiting  . Metoclopramide Other (See Comments)    Dystonia, muscle rigidity.  Patient states she is only allergic to the "pill form, not the IV"    Family History  Problem Relation Age of Onset  . Cystic fibrosis Mother   . Hypertension Father   . Diabetes Brother   . Hypertension Maternal Grandmother     Prior to Admission medications   Medication Sig Start Date End Date Taking? Authorizing Provider  acetaminophen (TYLENOL) 325 MG tablet Take 2 tablets (650 mg total) by mouth every 6 (six) hours as needed for mild pain. 01/25/18   Elgie Collard, PA-C  aspirin EC 325 MG EC tablet Take 1 tablet (325 mg total) by mouth daily. 01/25/18   Elgie Collard, PA-C  atorvastatin (LIPITOR) 80 MG tablet Take 1 tablet (80 mg total) by mouth daily at 6 PM. 03/20/18   [name], MD  Blood Glucose Monitoring Suppl (ONETOUCH VERIO) w/Device KIT Use as directed 03/21/18   [name], MD  busPIRone (BUSPAR) 10 MG tablet Take 1 tablet (10 mg total) by mouth 3 (three) times daily. 03/20/18   [name], MD  diphenhydrAMINE (BENADRYL) 25 mg capsule Take 25 mg by mouth every 6 (six) hours as needed for allergies.    [provider]  docusate sodium (COLACE) 100 MG capsule Take 1 capsule (100 mg total) by mouth daily as needed for mild constipation. 02/03/18   Burtis Junes, NP  furosemide (LASIX) 40 MG tablet Take 1 tablet (40 mg total) by mouth daily. 03/20/18 03/15/19  [name], MD  gabapentin (NEURONTIN) 600 MG tablet Take 600 mg by mouth 2 (two) times daily.    [provider]  glucose blood test strip Use as instructed 03/21/18   [name], MD  guaiFENesin (MUCINEX) 600 MG 12 hr tablet Take 600 mg by mouth 2 (two) times daily.    [provider]   insulin aspart (NOVOLOG) 100 UNIT/ML injection Inject 0-12 Units into the skin 4 (four) times daily -  before meals and at bedtime. Uses a sliding scale: 70-119 = 0 units, 120-150 = 2 units, 151-180 =  4 units, 181-210 = 5 units, 211-240 = 6 units, 241-270 = 8 units, 271-300 = 9 units, 301-330 = 10 units, 331-360 = 11 units, 361-400 = 12 units, greater than 400 notify MD.    [provider]  insulin detemir (LEVEMIR) 100 UNIT/ML injection Inject 0.15 mLs (15 Units total) into the skin every morning. 03/20/18   [name], MD  Insulin Syringe-Needle U-100 (BD INSULIN SYRINGE ULTRAFINE) 31G X 15/64" 0.5 ML MISC 1 each by Does not apply route 4 (four) times daily. 03/20/18   [name], MD  Lancets (ONETOUCH DELICA PLUS UGQBVQ94H) MISC 1 Units by Does not apply route as directed. 03/21/18   [name], MD  metoprolol tartrate (LOPRESSOR) 25 MG tablet Take 0.5 tablets (12.5 mg total) by mouth 2 (two) times daily. 03/20/18   [name], MD  pantoprazole (PROTONIX) 40 MG tablet Take 1 tablet (40 mg total) by mouth daily. 03/20/18   [name], MD  potassium chloride (K-DUR) 10 MEQ tablet Take 1 tablet (10 mEq total) by mouth daily. 03/20/18   [name], MD  promethazine (PHENERGAN) 12.5 MG tablet Take 1 tablet (12.5 mg total) by mouth every 6 (six) hours as needed for nausea or vomiting. 03/20/18   [name], MD  pyridostigmine (MESTINON) 60 MG tablet Take 60 mg by mouth 3 (three) times daily.    [provider]    Physical Exam: Vitals:   04/13/18 1542 04/13/18 1703 04/13/18 1745 04/13/18 1847  BP:  (!) 147/71  140/64  Pulse:  86  (!) 102  Resp:  20  (!) 30  Temp:  99.1 F (37.3 C)    TempSrc:  Oral    SpO2: 100% 94%  96%  Weight:   61 kg   Height:   5' 1"  (1.549 m)     GENERAL: 52 y.o.-year-old female patient, chronically ill-appearing, lying in the bed in no acute distress.  Pleasant and cooperative.   HEENT: Head atraumatic, normocephalic.  Pupils equal. Mucus membranes moist. NECK: Supple. No JVD. CHEST: Normal breath sounds bilaterally. No wheezing, rales, rhonchi or crackles. No use of accessory muscles of respiration.  No reproducible chest wall tenderness.  CARDIOVASCULAR: S1, S2 normal. No murmurs, rubs, or gallops. Cap refill <2 seconds. Pulses intact distally.  ABDOMEN: Soft, nondistended, nontender. No rebound, guarding, rigidity. Normoactive bowel sounds present in all four quadrants.  EXTREMITIES: Right knee tenderness to palpation, decreased range of motion secondary to pain.  No pedal edema, cyanosis, or clubbing. No  calf tenderness or Homan's sign.  NEUROLOGIC: The patient is alert and oriented x 3. Cranial nerves II through XII are grossly intact with no focal sensorimotor deficit. PSYCHIATRIC:  Normal affect, mood, thought content. SKIN: Warm, dry, and intact without obvious rash, lesion, or ulcer.    Labs on Admission:  CBC: Recent Labs  Lab 04/13/18 1725  WBC 11.7*  NEUTROABS 10.5*  HGB 12.8  HCT 40.2  MCV 90.3  PLT 675   Basic Metabolic Panel: Recent Labs  Lab 04/13/18 1725  NA 139  K 4.2  CL 104  CO2 22  GLUCOSE 322*  BUN 16  CREATININE 1.13*  CALCIUM 9.1   GFR: Estimated Creatinine Clearance: 49.9 mL/min (A) (by C-G formula based on SCr of 1.13 mg/dL (H)). Liver Function Tests: Recent Labs  Lab 04/13/18 1725  AST 21  ALT 29  ALKPHOS 97  BILITOT 1.4*  PROT 7.6  ALBUMIN 3.8   Recent Labs  Lab 04/13/18 1725  LIPASE 25   No results for input(s): AMMONIA in the last 168 hours. Coagulation Profile: No results for input(s): INR, PROTIME in the last 168 hours. Cardiac Enzymes: No results for input(s): CKTOTAL, CKMB, CKMBINDEX, TROPONINI in the last 168 hours. BNP (last 3 results) No results for input(s): PROBNP in the last 8760 hours. HbA1C: No results for input(s): HGBA1C in the last 72 hours. CBG: No results for input(s): GLUCAP in the last 168 hours. Lipid Profile: No  results for input(s): CHOL, HDL, LDLCALC, TRIG, CHOLHDL, LDLDIRECT in the last 72 hours. Thyroid Function Tests: No results for input(s): TSH, T4TOTAL, FREET4, T3FREE, THYROIDAB in the last 72 hours. Anemia Panel: No results for input(s): VITAMINB12, FOLATE, FERRITIN, TIBC, IRON, RETICCTPCT in the last 72 hours. Urine analysis:    Component Value Date/Time   COLORURINE YELLOW 04/01/2018 0546   APPEARANCEUR CLEAR 04/01/2018 0546   LABSPEC 1.020 04/01/2018 0546   PHURINE 5.0 04/01/2018 0546   GLUCOSEU >=500 (A) 04/01/2018 0546   HGBUR NEGATIVE 04/01/2018 0546   BILIRUBINUR NEGATIVE 04/01/2018 0546   BILIRUBINUR neg 01/17/2017 1533   KETONESUR 80 (A) 04/01/2018 0546   PROTEINUR 30 (A) 04/01/2018 0546   UROBILINOGEN 0.2 01/17/2017 1533   UROBILINOGEN 0.2 04/27/2015 1608   NITRITE NEGATIVE 04/01/2018 0546   LEUKOCYTESUR NEGATIVE 04/01/2018 0546   Sepsis Labs: '@LABRCNTIP'$ (procalcitonin:4,lacticidven:4) )No results found for this or any previous visit (from the past 240 hour(s)).   Radiological Exams on Admission: No results found.  EKG: Sinus tachycardia at 100 bpm with left axis deviation Q waves in V1 and 2.  And nonspecific ST-T wave changes.   Assessment/Plan  This is a 52 y.o. female with a history of coronary artery disease status post non-STEMI and CABG 5 months ago, insulin-dependent diabetes, hypertension, GERD, gastroparesis, anxiety and depression, tardive dyskinesia now being admitted with:  #.  Diabetic gastroparesis Admit inpatient N.p.o. IV fluid hydration Encourage ambulation Antiemetics  #.  Hyperglycemia without DKA Accu-Cheks with regular insulin sliding scale coverage  #.  History of coronary artery disease Continue aspirin, atorvastatin, metoprolol  #. History of anxiety and depression - Continue BuSpar  #. History of GERD - Continue Protonix  Admission status: Inpatient IV Fluids: Normal saline Diet/Nutrition: N.p.o. Consults called: None DVT  Px: Lovenox, SCDs and early ambulation. Code Status: Full Code  Disposition Plan: To home in 1-2 days  All the records are reviewed and case discussed with ED provider. Management plans discussed with the patient and/or family who express understanding and agree with plan  of care.  Kekai Geter D.O. on 04/13/2018 at 7:38 PM CC: Primary care physician; [name], MD   04/13/2018, 7:38 PM

## 2018-04-13 NOTE — ED Notes (Signed)
Attempted three times to obtain venous access via [place] w/o success. Dr Tamera Punt attempted EJ w/o success. Pt agreed to having femoral line placed for treatment

## 2018-04-13 NOTE — ED Notes (Addendum)
EKG given to Hospitalist,Hugelmeyer,MD., for review.

## 2018-04-13 NOTE — ED Notes (Signed)
Vital signs taken at 1545 charted for 1703

## 2018-04-13 NOTE — ED Notes (Signed)
Notified PA that previous nausea medications did not work

## 2018-04-13 NOTE — ED Provider Notes (Signed)
.  Central Line Date/Time: 04/13/2018 11:25 PM Performed by: Malvin Johns, MD Authorized by: Malvin Johns, MD   Consent:    Consent obtained:  Written   Consent given by:  Patient   Risks discussed:  Arterial puncture, bleeding, infection, incorrect placement and nerve damage   Alternatives discussed:  No treatment Pre-procedure details:    Hand hygiene: Hand hygiene performed prior to insertion     Sterile barrier technique: All elements of maximal sterile technique followed     Skin preparation:  ChloraPrep   Skin preparation agent: Skin preparation agent completely dried prior to procedure   Anesthesia (see MAR for exact dosages):    Anesthesia method:  Local infiltration   Local anesthetic:  Lidocaine 1% w/o epi Procedure details:    Location:  L femoral   Patient position:  Flat   Procedural supplies:  Triple lumen   Landmarks identified: yes     Ultrasound guidance: yes     Sterile ultrasound techniques: Sterile gel and sterile probe covers were used     Number of attempts:  1   Successful placement: yes   Post-procedure details:    Post-procedure:  Dressing applied and line sutured   Assessment:  Blood return through all ports and free fluid flow   Patient tolerance of procedure:  Tolerated well, no immediate complications   Patient with a history of diabetes, gastroparesis and prior DKA presents with vomiting.  She appears dehydrated and is tachycardic.  IV access was unobtainable.  She has diffuse scarring of her extremities and scarring of the sides of her neck.  An EEG was attempted but was unsuccessful.  Multiple peripheral lines were attempted.  Therefore a central line was placed.  She will be admitted for further treatment.  There is no suggestions of DKA on this admission.  It is strongly encouraged that the patient has permanent access placed while the patient is in the hospital.  She had to have a central line placed on her last admission as well.    Malvin Johns, MD 04/13/18 2328

## 2018-04-13 NOTE — ED Triage Notes (Signed)
Pt from home via EMS c/o abd pain, nausea and emesis. Pt is A&O and in NAD. Pt CBG is 402

## 2018-04-14 ENCOUNTER — Telehealth: Payer: Self-pay

## 2018-04-14 ENCOUNTER — Other Ambulatory Visit: Payer: Self-pay

## 2018-04-14 DIAGNOSIS — I2510 Atherosclerotic heart disease of native coronary artery without angina pectoris: Secondary | ICD-10-CM

## 2018-04-14 DIAGNOSIS — G2401 Drug induced subacute dyskinesia: Secondary | ICD-10-CM

## 2018-04-14 DIAGNOSIS — E1143 Type 2 diabetes mellitus with diabetic autonomic (poly)neuropathy: Secondary | ICD-10-CM

## 2018-04-14 DIAGNOSIS — F419 Anxiety disorder, unspecified: Secondary | ICD-10-CM

## 2018-04-14 DIAGNOSIS — Z951 Presence of aortocoronary bypass graft: Secondary | ICD-10-CM

## 2018-04-14 DIAGNOSIS — F329 Major depressive disorder, single episode, unspecified: Secondary | ICD-10-CM

## 2018-04-14 LAB — URINALYSIS, ROUTINE W REFLEX MICROSCOPIC
BILIRUBIN URINE: NEGATIVE
Glucose, UA: >=500 mg/dL — AB
Ketones, ur: 20 mg/dL — AB
Leukocytes, UA: NEGATIVE
Nitrite: NEGATIVE
PH: 5 (ref 5.0–8.0)
Protein, ur: 100 mg/dL — AB
Specific Gravity, Urine: 1.024 (ref 1.005–1.030)

## 2018-04-14 LAB — COMPREHENSIVE METABOLIC PANEL
ALBUMIN: 3.1 g/dL — AB (ref 3.5–5.0)
ALT: 24 U/L (ref 0–44)
ANION GAP: 5 (ref 5–15)
AST: 18 U/L (ref 15–41)
Alkaline Phosphatase: 76 U/L (ref 38–126)
BUN: 16 mg/dL (ref 6–20)
CALCIUM: 8.4 mg/dL — AB (ref 8.9–10.3)
CO2: 24 mmol/L (ref 22–32)
CREATININE: 1.06 mg/dL — AB (ref 0.44–1.00)
Chloride: 112 mmol/L — ABNORMAL HIGH (ref 98–111)
GFR calc Af Amer: >60 mL/min (ref >60)
GFR calc non Af Amer: >60 mL/min (ref >60)
GLUCOSE: 94 mg/dL (ref 70–99)
Potassium: 3.3 mmol/L — ABNORMAL LOW (ref 3.5–5.1)
Sodium: 141 mmol/L (ref 135–145)
TOTAL PROTEIN: 6 g/dL — AB (ref 6.5–8.1)
Total Bilirubin: 0.7 mg/dL (ref 0.3–1.2)

## 2018-04-14 LAB — GLUCOSE, CAPILLARY
GLUCOSE-CAPILLARY: 62 mg/dL — AB (ref 70–99)
GLUCOSE-CAPILLARY: 173 mg/dL — AB (ref 70–99)
GLUCOSE-CAPILLARY: 234 mg/dL — AB (ref 70–99)
Glucose-Capillary: 124 mg/dL — ABNORMAL HIGH (ref 70–99)
Glucose-Capillary: 111 mg/dL — ABNORMAL HIGH (ref 70–99)
Glucose-Capillary: 51 mg/dL — ABNORMAL LOW (ref 70–99)
Glucose-Capillary: 123 mg/dL — ABNORMAL HIGH (ref 70–99)
Glucose-Capillary: 191 mg/dL — ABNORMAL HIGH (ref 70–99)

## 2018-04-14 LAB — C DIFFICILE QUICK SCREEN W PCR REFLEX
C Diff antigen: NEGATIVE
C Diff interpretation: NOT DETECTED
C Diff toxin: NEGATIVE

## 2018-04-14 LAB — CBC
HEMATOCRIT: 34.7 % — AB (ref 36.0–46.0)
Hemoglobin: 10.8 g/dL — ABNORMAL LOW (ref 12.0–15.0)
MCH: 28.6 pg (ref 26.0–34.0)
MCHC: 31.1 g/dL (ref 30.0–36.0)
MCV: 91.8 fL (ref 80.0–100.0)
Platelets: 277 10*3/uL (ref 150–400)
RBC: 3.78 MIL/uL — ABNORMAL LOW (ref 3.87–5.11)
RDW: 14.7 % (ref 11.5–15.5)
WBC: 8.2 10*3/uL (ref 4.0–10.5)
nRBC: 0 % (ref 0.0–0.2)

## 2018-04-14 MED ORDER — DOCUSATE SODIUM 100 MG PO CAPS: 100.0000 mg | ORAL_CAPSULE | Freq: Every day | ORAL | Status: DC | PRN

## 2018-04-14 MED ORDER — PROMETHAZINE HCL 25 MG PO TABS: 12.5000 mg | ORAL_TABLET | Freq: Four times a day (QID) | ORAL | Status: DC | PRN

## 2018-04-14 MED ORDER — LOPERAMIDE HCL 2 MG PO CAPS
2.0000 mg | ORAL_CAPSULE | ORAL | Status: DC | PRN
  Administered 2018-04-14 – 2018-04-15 (×3): 2 mg via ORAL
  Filled 2018-04-14 (×3): qty 1

## 2018-04-14 MED ORDER — DEXTROSE 50 % IV SOLN
INTRAVENOUS | Status: AC
  Administered 2018-04-14: 25 mL
  Filled 2018-04-14: qty 50

## 2018-04-14 MED ORDER — INSULIN ASPART 100 UNIT/ML ~~LOC~~ SOLN
0.0000 [IU] | Freq: Three times a day (TID) | SUBCUTANEOUS | Status: DC
  Administered 2018-04-15: 3 [IU] via SUBCUTANEOUS

## 2018-04-14 MED ORDER — INSULIN DETEMIR 100 UNIT/ML ~~LOC~~ SOLN
15.0000 [IU] | Freq: Every day | SUBCUTANEOUS | Status: DC
  Administered 2018-04-14 – 2018-04-15 (×2): 15 [IU] via SUBCUTANEOUS
  Filled 2018-04-14 (×3): qty 0.15

## 2018-04-14 MED ORDER — IPRATROPIUM-ALBUTEROL 0.5-2.5 (3) MG/3ML IN SOLN: 3.0000 mL | Freq: Four times a day (QID) | RESPIRATORY_TRACT | Status: DC | PRN

## 2018-04-14 MED ORDER — INSULIN DETEMIR 100 UNIT/ML ~~LOC~~ SOLN: 15.0000 [IU] | SUBCUTANEOUS | Status: DC

## 2018-04-14 MED ORDER — DEXTROSE 50 % IV SOLN
INTRAVENOUS | Status: AC
  Administered 2018-04-14: 50 mL
  Filled 2018-04-14: qty 50

## 2018-04-14 MED ORDER — INSULIN ASPART 100 UNIT/ML ~~LOC~~ SOLN
0.0000 [IU] | Freq: Three times a day (TID) | SUBCUTANEOUS | Status: DC
  Administered 2018-04-14: 6 [IU] via SUBCUTANEOUS
  Administered 2018-04-14: 5 [IU] via SUBCUTANEOUS

## 2018-04-14 MED ORDER — SODIUM CHLORIDE 0.9% FLUSH: 10.0000 mL | INTRAVENOUS | Status: DC | PRN

## 2018-04-14 MED ORDER — PANTOPRAZOLE SODIUM 40 MG PO TBEC
40.0000 mg | DELAYED_RELEASE_TABLET | Freq: Every day | ORAL | Status: DC
  Administered 2018-04-14 – 2018-04-15 (×2): 40 mg via ORAL
  Filled 2018-04-14 (×2): qty 1

## 2018-04-14 NOTE — Evaluation (Signed)
Physical Therapy Evaluation Patient Details Name: [name] MRN: [id number] DOB: 03-Nov-1965 Today's Date: 04/14/2018   History of Present Illness  [name] is a 52 y.o. female with a known history of coronary artery disease status post non-STEMI and CABG 5 months ago, insulin-dependent diabetes, hypertension, GERD, gastroparesis, anxiety and depression, tardive dyskinesia presents to the emergency department for evaluation of nausea and vomiting.  Admitted for gastroparesis.  Clinical Impression  Patient presents with decreased independence with mobility due to weakness and decreased balance.  She was receiving HHPT prior to admission and will benefit from continued PT upon d/c.  Will follow for skilled PT during acute stay.    Follow Up Recommendations Home health PT;Supervision - Intermittent(was getting services prior to admission through [facility])    Equipment Recommendations  None recommended by PT    Recommendations for Other Services       Precautions / Restrictions Precautions Precautions: Fall Restrictions Weight Bearing Restrictions: No      Mobility  Bed Mobility Overal bed mobility: Modified Independent                Transfers Overall transfer level: Needs assistance Equipment used: None Transfers: Sit to/from Stand Sit to Stand: Supervision;Min guard         General transfer comment: assist for safety from EOB, stood from toilet in bathroom unaided  Ambulation/Gait Ambulation/Gait assistance: Supervision;Min guard Gait Distance (Feet): 12 Feet(x 2) Assistive device: None Gait Pattern/deviations: Step-through pattern;Step-to pattern;Decreased stride length;Trunk flexed     General Gait Details: able to ambulate in room no device (limited to in room only due to frequent stools per pt), but flexed posture, lowered center of gravity and occasionally touching surfaces for balance  Stairs            Wheelchair Mobility    Modified  [place] (Stroke Patients Only)       Balance Overall balance assessment: Needs assistance Sitting-balance support: No upper extremity supported Sitting balance-Leahy Scale: Good     Standing balance support: During functional activity;Single extremity supported;No upper extremity supported Standing balance-Leahy Scale: Fair Standing balance comment: standing at sink to wash hands, face, and perineal area with S occasional UE support on sink                             Pertinent Vitals/Pain Pain Assessment: No/denies pain    Home Living Family/patient expects to be discharged to:: Private residence Living Arrangements: Other relatives(sister) Available Help at Discharge: Family;Available PRN/intermittently Type of Home: Apartment Home Access: Stairs to enter Entrance Stairs-Rails: Right Entrance Stairs-Number of Steps: 1 flight Home Layout: One level Home Equipment: Grab bars - tub/shower Additional Comments: sister works 3-11pm    Prior Function Level of Independence: Independent               Journalist, newspaper        Extremity/Trunk Assessment   Upper Extremity Assessment Upper Extremity Assessment: Generalized weakness    Lower Extremity Assessment Lower Extremity Assessment: Generalized weakness       Communication   Communication: No difficulties  Cognition Arousal/Alertness: Awake/alert Behavior During Therapy: WFL for tasks assessed/performed Overall Cognitive Status: Within Functional Limits for tasks assessed                                        General Comments  Exercises     Assessment/Plan    PT Assessment Patient needs continued PT services  PT Problem List Decreased strength;Decreased mobility;Decreased activity tolerance;Decreased knowledge of use of DME;Decreased balance       PT Treatment Interventions DME instruction;Therapeutic activities;Therapeutic exercise;Gait training;Patient/family  education;Balance training;Stair training;Functional mobility training    PT Goals (Current goals can be found in the Care Plan section)  Acute Rehab PT Goals Patient Stated Goal: to go home PT Goal Formulation: With patient Time For Goal Achievement: 04/21/18 Potential to Achieve Goals: Good    Frequency Min 3X/week   Barriers to discharge        Co-evaluation               AM-PAC PT "6 Clicks" Daily Activity  Outcome Measure Difficulty turning over in bed (including adjusting bedclothes, sheets and blankets)?: A Little Difficulty moving from lying on back to sitting on the side of the bed? : A Little Difficulty sitting down on and standing up from a chair with arms (e.g., wheelchair, bedside commode, etc,.)?: A Little Help needed moving to and from a bed to chair (including a wheelchair)?: A Little Help needed walking in hospital room?: A Little Help needed climbing 3-5 steps with a railing? : A Lot 6 Click Score: 17    End of Session Equipment Utilized During Treatment: Gait belt Activity Tolerance: Patient tolerated treatment well Patient left: with call bell/phone within reach;in bed(sitting EOB)   PT Visit Diagnosis: Other abnormalities of gait and mobility (R26.89);Muscle weakness (generalized) (M62.81)    Time: 8841-6606 PT Time Calculation (min) (ACUTE ONLY): 23 min   Charges:   PT Evaluation $PT Eval Low Complexity: 1 Low PT Treatments $Therapeutic Activity: 8-22 mins        Magda Kiel, PT Acute Rehabilitation Services [phone] 04/14/2018   Reginia Naas 04/14/2018, 11:00 AM

## 2018-04-14 NOTE — Progress Notes (Signed)
Hypoglycemic Event  CBG: 62  Treatment: D50 IV 25 mL  Symptoms: None  Follow-up CBG: [id number] CBG Result:123  Possible Reasons for Event: Inadequate meal intake and Medication regimen:    Comments/MD notified:Netty     [name] L

## 2018-04-14 NOTE — Progress Notes (Signed)
Pt is NPO and CBG dropped to 51.D5% 25 ml was given and blood sugar elevated to 111.This RN Corporate treasurer  via  Amion for an order  to change fluid. Continue  Monitor and waiting to the end of the shift for an order.

## 2018-04-14 NOTE — Progress Notes (Addendum)
PROGRESS NOTE  [name] OEU:[id number] DOB: 1965-09-10 DOA: 04/13/2018 PCP: [name], MD  HPI/Brief Narrative  [name] is a 52 y.o. year old female with medical history significant for CAD s/p CABG ( 12/2017), t1DM, chronic ankle edema, HTN, gastroparesis, anxiety and depression who presented on 04/13/2018 with one day of nausea and vomiting and was found to have acute flare of gastroparesis.  Subjective No nausea or vomiting this morning.   Assessment/Plan:  #CAD status post CABG (12/2017), stable.  No current chest pain.  Continue home aspirin, atorvastatin, metoprolol tartrate  #Intractable nausea/vomiting secondary to gastroparesis, acute on chronic, improving.  Has a long standing history of gastroparesis with recurrent flares.  Currently resolution of nausea and vomiting with IVF and antiemetics for supportive care and ready to try diet.  Will start gastroparesis diet and monitor, continue IV fluids for now.  Continue PRN home Phenergan  #Hypokalemia, Still low nat 3.3 9( mag wnl), related to above problem. Supplement oral and repeat BMP in am.   #GERD, stable. Will resume home protonix now that eating and discontinue IV pepcid  #Anxiety/depression, stable.  Continue home BuSpar  #Type 1 diabetes with neuropathy, controlled.  A1c 8.2% (August 2019). Resume home insulin regimen, correction coverage, monitor CBG, continue home gabapentin.  #Increased stool.  C. difficile negative.  Has not had any laxatives here.  Imodium as needed and monitor.  #Chronic constipation. Not currently having loose stool. Takes PRN laxatives, none here given loose stool  #Chronic ankle edema   Code Status: FULL   Family Communication: no family as bedside   Disposition Plan: monitor while doing diet today, she wants to consider long term port eventually. Has femoral cath in due to difficulty getting IV access, will only continue for today if needed but anticipate discontinuing  and discharging in next 24 hours.     Consultants:  none     Procedures:  none   Antimicrobials: Anti-infectives (From admission, onward)   None         Cultures:  none  Telemetry:no  DVT prophylaxis:  Lovenox   Objective: Vitals:   04/13/18 2100 04/13/18 2153 04/14/18 0445 04/14/18 1157  BP: 135/62 135/63 (!) 105/55 133/67  Pulse: (!) 103 100 77 82  Resp: 20 (!) 24 16   Temp:  98.5 F (36.9 C) 98.2 F (36.8 C)   TempSrc:  Oral Oral   SpO2: 97% 100% 98%   Weight:      Height:        Intake/Output Summary (Last 24 hours) at 04/14/2018 1355 Last data filed at 04/14/2018 1540 Gross per 24 hour  Intake 1069.07 ml  Output -  Net 1069.07 ml   Filed Weights   04/13/18 1745  Weight: 61 kg    Exam: General-normal appearing female, no distress Eyes-EOMI, anicteric, normal conjunctivae ENT- moist oral mucosa, normal dentition Cardiovascular-regular rate and rhythm, no appreciable murmurs rubs or gallops, scant edema near ankle Respiratory-normal respiratory effort on room air, clear breath sounds Abdomen-soft, nondistended, nontender Skin, no rash no ulcers or lesions Neurologic- grossly no focal neuro deficits Psych appropriate affect and mood, memory, alert and oriented x4   Data Reviewed: CBC: Recent Labs  Lab 04/13/18 1725 04/14/18 0500  WBC 11.7* 8.2  NEUTROABS 10.5*  --   HGB 12.8 10.8*  HCT 40.2 34.7*  MCV 90.3 91.8  PLT 379 086   Basic Metabolic Panel: Recent Labs  Lab 04/13/18 1725 04/13/18 2150 04/14/18 0500  NA 139  --  141  K 4.2  --  3.3*  CL 104  --  112*  CO2 22  --  24  GLUCOSE 322*  --  94  BUN 16  --  16  CREATININE 1.13*  --  1.06*  CALCIUM 9.1  --  8.4*  MG  --  2.1  --   PHOS  --  2.6  --    GFR: Estimated Creatinine Clearance: 53.2 mL/min (A) (by C-G formula based on SCr of 1.06 mg/dL (H)). Liver Function Tests: Recent Labs  Lab 04/13/18 1725 04/14/18 0500  AST 21 18  ALT 29 24  ALKPHOS 97 76    BILITOT 1.4* 0.7  PROT 7.6 6.0*  ALBUMIN 3.8 3.1*   Recent Labs  Lab 04/13/18 1725  LIPASE 25   No results for input(s): AMMONIA in the last 168 hours. Coagulation Profile: No results for input(s): INR, PROTIME in the last 168 hours. Cardiac Enzymes: No results for input(s): CKTOTAL, CKMB, CKMBINDEX, TROPONINI in the last 168 hours. BNP (last 3 results) No results for input(s): PROBNP in the last 8760 hours. HbA1C: No results for input(s): HGBA1C in the last 72 hours. CBG: Recent Labs  Lab 04/14/18 0442 04/14/18 0516 04/14/18 0753 04/14/18 0840 04/14/18 1214  GLUCAP 51* 111* 62* 123* 173*   Lipid Profile: No results for input(s): CHOL, HDL, LDLCALC, TRIG, CHOLHDL, LDLDIRECT in the last 72 hours. Thyroid Function Tests: No results for input(s): TSH, T4TOTAL, FREET4, T3FREE, THYROIDAB in the last 72 hours. Anemia Panel: No results for input(s): VITAMINB12, FOLATE, FERRITIN, TIBC, IRON, RETICCTPCT in the last 72 hours. Urine analysis:    Component Value Date/Time   COLORURINE YELLOW 04/14/2018 0008   APPEARANCEUR HAZY (A) 04/14/2018 0008   LABSPEC 1.024 04/14/2018 0008   PHURINE 5.0 04/14/2018 0008   GLUCOSEU >=500 (A) 04/14/2018 0008   HGBUR MODERATE (A) 04/14/2018 0008   BILIRUBINUR NEGATIVE 04/14/2018 0008   BILIRUBINUR neg 01/17/2017 1533   KETONESUR 20 (A) 04/14/2018 0008   PROTEINUR 100 (A) 04/14/2018 0008   UROBILINOGEN 0.2 01/17/2017 1533   UROBILINOGEN 0.2 04/27/2015 1608   NITRITE NEGATIVE 04/14/2018 0008   LEUKOCYTESUR NEGATIVE 04/14/2018 0008   Sepsis Labs: @LABRCNTIP (procalcitonin:4,lacticidven:4)  ) Recent Results (from the past 240 hour(s))  C difficile quick scan w PCR reflex     Status: None   Collection Time: 04/14/18  1:51 AM  Result Value Ref Range Status   C Diff antigen NEGATIVE NEGATIVE Final   C Diff toxin NEGATIVE NEGATIVE Final   C Diff interpretation No C. difficile detected.  Final    Comment: Performed at [facility], [place] [address] [place], [place] [address]      Studies: No results found.  Scheduled Meds: . aspirin  325 mg Oral Daily  . atorvastatin  80 mg Oral q1800  . busPIRone  10 mg Oral TID  . enoxaparin (LOVENOX) injection  40 mg Subcutaneous Q24H  . gabapentin  600 mg Oral BID  . insulin aspart  0-15 Units Subcutaneous Q4H  . metoprolol tartrate  12.5 mg Oral BID  . pyridostigmine  60 mg Oral TID    Continuous Infusions: . sodium chloride 75 mL/hr at 04/14/18 1158  . famotidine (PEPCID) IV 20 mg (04/14/18 1152)     LOS: 1 day     Desiree Hane, MD [facility] Pager [phone]  If 7PM-7AM, please contact night-coverage [email or web address] Password [email or web address] 04/14/2018, 1:55 PM

## 2018-04-14 NOTE — Telephone Encounter (Signed)
Met with the patient briefly today. She is well known to the [facility]. She said that she didn't move with her sister and ended up staying at her current apartment and another sister moved in with her.  She said that this sister is not a " caregiver" like her other sister was.   The patient receives disability and has medicare A/B but does not have drug coverage/ She needs to renew her [employer] for [employer]. She has also been given the contact # for [employer] [facility] [place] to request assistance from [facility] for information about medicare advantage and drug plans.   She has [employer] for transportation.  An appointment has been scheduled for her at [facility] on 04/24/18 @ 0950 and the information was placed on her AVS.

## 2018-04-14 NOTE — Progress Notes (Signed)
Hypoglycemic Event  CBG: 51  Treatment:dextrose 50% 6ml  Symptoms: asymptomatic  Follow-up CBG:0516 CBG Result:111  Possible Reasons for Event: Pt is NPO  Comments/MD notified Dr. Silas [place] contacted via Anion.Waiting for an order ,will continue to monitor.    Zalia Hautala Lorrine Kin

## 2018-04-15 LAB — GLUCOSE, CAPILLARY
GLUCOSE-CAPILLARY: 103 mg/dL — AB (ref 70–99)
GLUCOSE-CAPILLARY: 75 mg/dL (ref 70–99)
Glucose-Capillary: 172 mg/dL — ABNORMAL HIGH (ref 70–99)

## 2018-04-15 MED ORDER — LOPERAMIDE HCL 2 MG PO CAPS: 2.0000 mg | ORAL_CAPSULE | ORAL | 0 refills | Status: DC | PRN

## 2018-04-15 MED ORDER — INFLUENZA VAC SPLIT QUAD 0.5 ML IM SUSY
0.5000 mL | PREFILLED_SYRINGE | INTRAMUSCULAR | Status: AC
  Administered 2018-04-15: 0.5 mL via INTRAMUSCULAR
  Filled 2018-04-15: qty 0.5

## 2018-04-15 NOTE — Care Management Note (Signed)
Case Management Note  Patient Details  Name: Lashai Grosch MRN: [id number] Date of Birth: 1966/03/26  Subjective/Objective:                  Discharge planning need resumption of RN and pt for hhc  Action/Plan: Text message sent to md for resumption of care orders.  Expected Discharge Date:  04/15/18               Expected Discharge Plan:  Home/Self Care  In-House Referral:     Discharge planning Services  CM Consult  Post Acute Care Choice:    Choice offered to:     DME Arranged:    DME Agency:     HH Arranged:    HH Agency:     Status of Service:  Completed, signed off  If discussed at [employer] of Stay Meetings, dates discussed:    Additional Comments:  Leeroy Cha, RN 04/15/2018, 1:55 PM

## 2018-04-15 NOTE — Discharge Summary (Signed)
Discharge Summary  [name] VOJ:[id number] DOB: 09/21/1965  PCP: [name], MD  Admit date: 04/13/2018 Discharge date: 04/15/2018   Time spent: < 25 minutes  Admitted From: home Disposition:  home  Recommendations for Outpatient Follow-up:  1. Follow up with PCP in 1 week  follow up with repeat K.  2. Discuss risks/benefits of port placement for IV access . Discussed my concerns for infections versus potential benefit of easier IV access     Discharge Diagnoses:  Active Hospital Problems   Diagnosis Date Noted  . Gastroparesis 04/13/2018  . S/P CABG x 3 01/14/2018  . CAD (coronary artery disease), native coronary artery 01/11/2018  . Tardive dyskinesia   . DKA, type 1 ([place]) 03/18/2016  . Diabetic gastroparesis ([place])   . Anxiety and depression 05/18/2015    Resolved Hospital Problems  No resolved problems to display.    Discharge Condition: Stable   CODE STATUS:FUll code  History of present illness:  [name] is a 52 y.o. year old female with medical history significant for CAD s/p CABG ( 12/2017), t1DM, chronic ankle edema, HTN, gastroparesis, anxiety and depression who presented on 04/13/2018 with one day of nausea and vomiting and was found to have acute flare of gastroparesis.. Remaining hospital course addressed in problem based format below:   Hospital Course:   #Intractable nausea/vomiting secondary to gastroparesis, acute on chronic,resolved. Has history of recurrent flares, resolved with IVF and reglan PRN.  Other etiologies for N/v ruled out with wnl UA, normal lipase and no Biliary abnormalities on CMP.Tolerated oral diet. Wants to discuss long tem port for IVF with PCP and cardiologist given difficulty getting IV access in hospital. Discussed my concerns about risks ( infection) vs benefits ( IV access)  Encouraged following gastroparesis diet at home  #Hypokalemia. Gave oral repletion here. Continue home supplementation as well. Needs PCP  follow up with repeat K.   #GERD, stable. While initially NPO was on IV pepcid but transitioned back to home protonix  #Anxiety/depression, stable.  Continue home BuSpar  #CAD status post CABG (12/2017), stable.  No current chest pain.  Continue home aspirin, atorvastatin, metoprolol tartrate  #Type 1 diabetes with neuropathy, controlled.  A1c 8.2% (August 2019). While NPO did have one episode of hypoglycemia to 50s requirng D50( on 10/21) but was able to resume home insulin regimen once able to tolerate PO  and will continue same regimen on discharge.  #Increased stool.  C. difficile negative.  Has not had any laxatives here. Stool frequency decreased and became more solid with imodium.  Imodium as needed on discharge.  #Chronic constipation. Not currently having loose stool. Takes PRN laxatives  #Chronic ankle edema . Continue home PRN lasix   Consultations:  none  Procedures/Studies: none  Discharge Exam: BP 133/63   Pulse 81   Temp 99 F (37.2 C) (Oral)   Resp (!) 21   Ht '5\' 1"'$  (1.549 m)   Wt 61 kg   LMP 01/11/2018 (Approximate)   SpO2 99%   BMI 25.41 kg/m   General: Lying in bed, no apparent distress Eyes: EOMI, anicteric ENT: Oral Mucosa clear and moist Cardiovascular: regular rate and rhythm, no murmurs, rubs or gallops, no edema, Respiratory: Normal respiratory effort on room air, lungs clear to auscultation bilaterally Abdomen: soft, non-distended, non-tender, normal bowel sounds Skin: No Rash Neurologic: Grossly no focal neuro deficit.Mental status AAOx3, speech normal, Psychiatric:Appropriate affect, and mood   Discharge Instructions You were cared for by a hospitalist during your hospital  stay. If you have any questions about your discharge medications or the care you received while you were in the hospital after you are discharged, you can call the unit and asked to speak with the hospitalist on call if the hospitalist that took care of you is not  available. Once you are discharged, your primary care physician will handle any further medical issues. Please note that NO REFILLS for any discharge medications will be authorized once you are discharged, as it is imperative that you return to your primary care physician (or establish a relationship with a primary care physician if you do not have one) for your aftercare needs so that they can reassess your need for medications and monitor your lab values.  Discharge Instructions    Diet - low sodium heart healthy   Complete by:  As directed    Increase activity slowly   Complete by:  As directed      Allergies as of 04/15/2018      Reactions   Anesthetics, Amide Nausea And Vomiting   Penicillins Diarrhea, Nausea And Vomiting, Other (See Comments)   Has patient had a PCN reaction causing immediate rash, facial/tongue/throat swelling, SOB or lightheadedness with hypotension: Yes Has patient had a PCN reaction causing severe rash involving mucus membranes or skin necrosis: No Has patient had a PCN reaction that required hospitalization No Has patient had a PCN reaction occurring within the last 10 years: Yes  If all of the above answers are "NO", then may proceed with Cephalosporin use.   Buprenorphine Hcl Rash   Encainide Nausea And Vomiting   Metoclopramide Other (See Comments)   Dystonia, muscle rigidity.  Patient states she is only allergic to the "pill form, not the IV"      Medication List    TAKE these medications   acetaminophen 325 MG tablet Commonly known as:  TYLENOL Take 2 tablets (650 mg total) by mouth every 6 (six) hours as needed for mild pain.   aspirin 325 MG EC tablet Take 1 tablet (325 mg total) by mouth daily.   atorvastatin 80 MG tablet Commonly known as:  LIPITOR Take 1 tablet (80 mg total) by mouth daily at 6 PM.   busPIRone 10 MG tablet Commonly known as:  BUSPAR Take 1 tablet (10 mg total) by mouth 3 (three) times daily.   diphenhydrAMINE 25 mg  capsule Commonly known as:  BENADRYL Take 25 mg by mouth every 6 (six) hours as needed for allergies.   docusate sodium 100 MG capsule Commonly known as:  COLACE Take 1 capsule (100 mg total) by mouth daily as needed for mild constipation.   furosemide 40 MG tablet Commonly known as:  LASIX Take 1 tablet (40 mg total) by mouth daily. What changed:    when to take this  reasons to take this   gabapentin 600 MG tablet Commonly known as:  NEURONTIN Take 600 mg by mouth 2 (two) times daily.   glucose blood test strip Use as instructed   insulin aspart 100 UNIT/ML injection Commonly known as:  novoLOG Inject 0-12 Units into the skin 4 (four) times daily -  before meals and at bedtime. Uses a sliding scale: 70-119 = 0 units, 120-150 = 2 units, 151-180 =  4 units, 181-210 = 5 units, 211-240 = 6 units, 241-270 = 8 units, 271-300 = 9 units, 301-330 = 10 units, 331-360 = 11 units, 361-400 = 12 units, greater than 400 notify MD.   insulin detemir 100  UNIT/ML injection Commonly known as:  LEVEMIR Inject 0.15 mLs (15 Units total) into the skin every morning.   Insulin Syringe-Needle U-100 31G X 15/64" 0.5 ML Misc 1 each by Does not apply route 4 (four) times daily.   loperamide 2 MG capsule Commonly known as:  IMODIUM Take 1 capsule (2 mg total) by mouth as needed for diarrhea or loose stools.   metoprolol tartrate 25 MG tablet Commonly known as:  LOPRESSOR Take 0.5 tablets (12.5 mg total) by mouth 2 (two) times daily.   ONETOUCH DELICA PLUS [id number] Misc 1 Units by Does not apply route as directed.   ONETOUCH VERIO w/Device Kit Use as directed   pantoprazole 40 MG tablet Commonly known as:  PROTONIX Take 1 tablet (40 mg total) by mouth daily.   potassium chloride 10 MEQ tablet Commonly known as:  K-DUR Take 1 tablet (10 mEq total) by mouth daily. What changed:    when to take this  reasons to take this   promethazine 12.5 MG tablet Commonly known as:   PHENERGAN Take 1 tablet (12.5 mg total) by mouth every 6 (six) hours as needed for nausea or vomiting.   pyridostigmine 60 MG tablet Commonly known as:  MESTINON Take 60 mg by mouth 3 (three) times daily.      Allergies  Allergen Reactions  . Anesthetics, Amide Nausea And Vomiting  . Penicillins Diarrhea, Nausea And Vomiting and Other (See Comments)    Has patient had a PCN reaction causing immediate rash, facial/tongue/throat swelling, SOB or lightheadedness with hypotension: Yes Has patient had a PCN reaction causing severe rash involving mucus membranes or skin necrosis: No Has patient had a PCN reaction that required hospitalization No Has patient had a PCN reaction occurring within the last 10 years: Yes  If all of the above answers are "NO", then may proceed with Cephalosporin use.   . Buprenorphine Hcl Rash  . Encainide Nausea And Vomiting  . Metoclopramide Other (See Comments)    Dystonia, muscle rigidity.  Patient states she is only allergic to the "pill form, not the IV"   Follow-up [place]. Go on 04/24/2018.   Why:  at 9:50am for an appointment with Dr Denita Lung information: [place] [phone]           The results of significant diagnostics from this hospitalization (including imaging, microbiology, ancillary and laboratory) are listed below for reference.    Significant Diagnostic Studies: No results found.  Microbiology: Recent Results (from the past 240 hour(s))  C difficile quick scan w PCR reflex     Status: None   Collection Time: 04/14/18  1:51 AM  Result Value Ref Range Status   C Diff antigen NEGATIVE NEGATIVE Final   C Diff toxin NEGATIVE NEGATIVE Final   C Diff interpretation No C. difficile detected.  Final    Comment: Performed at [facility], [place] [address], [place], [place] [address]     Labs: Basic Metabolic  Panel: Recent Labs  Lab 04/13/18 1725 04/13/18 2150 04/14/18 0500  NA 139  --  141  K 4.2  --  3.3*  CL 104  --  112*  CO2 22  --  24  GLUCOSE 322*  --  94  BUN 16  --  16  CREATININE 1.13*  --  1.06*  CALCIUM 9.1  --  8.4*  MG  --  2.1  --  PHOS  --  2.6  --    Liver Function Tests: Recent Labs  Lab 04/13/18 1725 04/14/18 0500  AST 21 18  ALT 29 24  ALKPHOS 97 76  BILITOT 1.4* 0.7  PROT 7.6 6.0*  ALBUMIN 3.8 3.1*   Recent Labs  Lab 04/13/18 1725  LIPASE 25   No results for input(s): AMMONIA in the last 168 hours. CBC: Recent Labs  Lab 04/13/18 1725 04/14/18 0500  WBC 11.7* 8.2  NEUTROABS 10.5*  --   HGB 12.8 10.8*  HCT 40.2 34.7*  MCV 90.3 91.8  PLT 379 277   Cardiac Enzymes: No results for input(s): CKTOTAL, CKMB, CKMBINDEX, TROPONINI in the last 168 hours. BNP: BNP (last 3 results) No results for input(s): BNP in the last 8760 hours.  ProBNP (last 3 results) No results for input(s): PROBNP in the last 8760 hours.  CBG: Recent Labs  Lab 04/14/18 1214 04/14/18 1607 04/14/18 2031 04/15/18 0047 04/15/18 0757  GLUCAP 173* 234* 191* 75 103*       Signed:  Desiree Hane, MD [facility] 04/15/2018, 10:59 AM

## 2018-04-16 ENCOUNTER — Telehealth: Payer: Self-pay

## 2018-04-16 ENCOUNTER — Telehealth (HOSPITAL_COMMUNITY): Payer: Self-pay

## 2018-04-16 NOTE — Telephone Encounter (Signed)
Transition Care Management Follow-up Telephone Call Date of discharge and from where: 04/15/2018 [facility] [place]   Attempted to contact patient. Call placed to [phone] and the voicemail was full, unable to leave a message.

## 2018-04-16 NOTE — Telephone Encounter (Signed)
Pt called in regards to Cardiac Rehab - Scheduled orientation on 06/12/2018 at 8:15am. Pt will attend the 1:15pm exc class. Went over insurance with pt and she verbalized understanding and will only participate on Monday & Wednesdays due to the 20% co-insurance. Mailed packet.

## 2018-04-17 ENCOUNTER — Telehealth: Payer: Self-pay

## 2018-04-17 ENCOUNTER — Telehealth: Payer: Self-pay | Admitting: Family Medicine

## 2018-04-17 NOTE — Telephone Encounter (Signed)
Nurse [place] from [facility] called to inform PCP that patient does not answer her calls nor the door when she is scheduled to be seen, please address this at her next visit if possible  to discontinue visits if that's what the patient wants

## 2018-04-17 NOTE — Telephone Encounter (Signed)
Transition Care Management Follow-up Telephone Call - attempt #2   Date of discharge and from where: 04/15/2018 -    Attempted to contact patient. Call placed to [phone] and the voicemail was full, unable to leave a message.

## 2018-04-18 DIAGNOSIS — E860 Dehydration: Secondary | ICD-10-CM | POA: Diagnosis not present

## 2018-04-18 DIAGNOSIS — E1065 Type 1 diabetes mellitus with hyperglycemia: Secondary | ICD-10-CM | POA: Diagnosis not present

## 2018-04-21 DIAGNOSIS — E1065 Type 1 diabetes mellitus with hyperglycemia: Secondary | ICD-10-CM | POA: Diagnosis not present

## 2018-04-21 DIAGNOSIS — E860 Dehydration: Secondary | ICD-10-CM | POA: Diagnosis not present

## 2018-04-21 NOTE — Telephone Encounter (Signed)
[name] from [facility] called to let patient PCP know that patient has been doing good with her nurse visits and patient should be done in 2 weeks. Patient will continue with her PT until further notice.

## 2018-04-22 NOTE — Telephone Encounter (Signed)
Will route to PCP for review. 

## 2018-04-23 ENCOUNTER — Telehealth: Payer: Self-pay

## 2018-04-23 NOTE — Telephone Encounter (Signed)
Call placed to the patient # [phone]  to check on her and remind her of her appointment at [facility] tomorrow - 04/24/18 @ 0950.  Voicemail full, unable to leave a message.

## 2018-04-24 ENCOUNTER — Ambulatory Visit: Payer: Self-pay | Admitting: Family Medicine

## 2018-04-24 DIAGNOSIS — E860 Dehydration: Secondary | ICD-10-CM | POA: Diagnosis not present

## 2018-04-24 DIAGNOSIS — E1065 Type 1 diabetes mellitus with hyperglycemia: Secondary | ICD-10-CM | POA: Diagnosis not present

## 2018-04-24 NOTE — Telephone Encounter (Signed)
Noted  

## 2018-04-27 NOTE — Progress Notes (Signed)
Patient ID: [name], female   DOB: January 07, 1966, 51 y.o.   MRN: [id number]           Reason for Appointment : Consultation for Type 1 Diabetes  History of Present Illness   Referring HCP: Dr. Margarita Rana          Diagnosis: Type 1 diabetes mellitus, date of diagnosis: 1977         Previous history:  She has mostly been followed by primary care physicians for her diabetes with consistently poor control She has had periodic episodes of ketoacidosis  Recent history:   INSULIN regimen: BASAL insulin: Levemir 15 units in a.m.  MEALTIME insulin: Novolog based on blood sugar level only  Current management, blood sugar patterns and problems identified:    She is using syringes for her insulin as she cannot afford insulin pens  Usually taking NovoLog based on her blood sugar and will not take any if the blood sugar is below 150   However the last 2 to 3 days since she has not had a functioning glucose meter she has been taking 4 units NovoLog with every meal but trying to eat a full meal  Although she thinks she is checking her blood sugar 3 times a day she has not been able to use her meter the last 3 days, apparently needs a new battery  She thinks her blood sugars are relatively normal in the morning but mostly high later in the day with highest readings around lunchtime  She does not know how to count carbohydrates       Glucose monitoring:  is being done 3 times a day         Glucometer: Accu-Chek      Blood Glucose readings from recall:   PRE-MEAL Fasting Lunch Dinner Bedtime Overall  Glucose range: 60-130 200  160-200+  160-200   Mean/median:        POST-MEAL PC Breakfast PC Lunch PC Dinner  Glucose range: ? ?   Mean/median:       Hypoglycemia:  occurs rarely, occasionally at 9 PM Symptoms of hypoglycemia: Weakness, sometimes none Treatment of hypoglycemia: Snacks         Self-care: The diet that the patient has been following is: None  Mealtimes are: Breakfast  egg, grits, toast weekly;   Lunch: Dinner: baked chricken, rice, potatoes         Exercise:          Dietician consultation: Most recent: years ago.         CDE consultation:  Wt Readings from Last 3 Encounters:  04/28/18 134 lb (60.8 kg)  04/13/18 134 lb 7.7 oz (61 kg)  03/31/18 134 lb 7.7 oz (61 kg)   Diabetes labs:  Lab Results  Component Value Date   HGBA1C 8.1 (A) 04/28/2018   HGBA1C 8.2 (H) 02/19/2018   HGBA1C 8.6 (H) 01/08/2018   Lab Results  Component Value Date   MICROALBUR 14.3 (H) 04/28/2018   LDLCALC 54 01/07/2018   CREATININE 1.06 (H) 04/14/2018    Lab Results  Component Value Date   MICRALBCREAT 17.1 04/28/2018   MICRALBCREAT 254 (H) 09/04/2016     Allergies as of 04/28/2018      Reactions   Anesthetics, Amide Nausea And Vomiting   Penicillins Diarrhea, Nausea And Vomiting, Other (See Comments)   Has patient had a PCN reaction causing immediate rash, facial/tongue/throat swelling, SOB or lightheadedness with hypotension: Yes Has patient had a PCN reaction causing severe  rash involving mucus membranes or skin necrosis: No Has patient had a PCN reaction that required hospitalization No Has patient had a PCN reaction occurring within the last 10 years: Yes  If all of the above answers are "NO", then may proceed with Cephalosporin use.   Buprenorphine Hcl Rash   Encainide Nausea And Vomiting   Metoclopramide Other (See Comments)   Dystonia, muscle rigidity.  Patient states she is only allergic to the "pill form, not the IV"      Medication List        Accurate as of 04/28/18 11:59 PM. Always use your most recent med list.          acetaminophen 325 MG tablet Commonly known as:  TYLENOL Take 2 tablets (650 mg total) by mouth every 6 (six) hours as needed for mild pain.   aspirin 325 MG EC tablet Take 1 tablet (325 mg total) by mouth daily.   atorvastatin 80 MG tablet Commonly known as:  LIPITOR Take 1 tablet (80 mg total) by mouth daily at 6  PM.   busPIRone 10 MG tablet Commonly known as:  BUSPAR Take 1 tablet (10 mg total) by mouth 3 (three) times daily.   diphenhydrAMINE 25 mg capsule Commonly known as:  BENADRYL Take 25 mg by mouth every 6 (six) hours as needed for allergies.   docusate sodium 100 MG capsule Commonly known as:  COLACE Take 1 capsule (100 mg total) by mouth daily as needed for mild constipation.   furosemide 40 MG tablet Commonly known as:  LASIX Take 1 tablet (40 mg total) by mouth daily.   gabapentin 600 MG tablet Commonly known as:  NEURONTIN Take 600 mg by mouth 2 (two) times daily.   glucose blood test strip Use as instructed   glucose blood test strip Check sugar 4 times daily.   insulin aspart 100 UNIT/ML injection Commonly known as:  novoLOG Inject 0-12 Units into the skin 4 (four) times daily -  before meals and at bedtime. Uses a sliding scale: 70-119 = 0 units, 120-150 = 2 units, 151-180 =  4 units, 181-210 = 5 units, 211-240 = 6 units, 241-270 = 8 units, 271-300 = 9 units, 301-330 = 10 units, 331-360 = 11 units, 361-400 = 12 units, greater than 400 notify MD.   insulin detemir 100 UNIT/ML injection Commonly known as:  LEVEMIR Inject 0.15 mLs (15 Units total) into the skin every morning.   Insulin Syringe-Needle U-100 31G X 5/16" 0.5 ML Misc 1 each by Does not apply route 4 (four) times daily.   loperamide 2 MG capsule Commonly known as:  IMODIUM Take 1 capsule (2 mg total) by mouth as needed for diarrhea or loose stools.   metoprolol tartrate 25 MG tablet Commonly known as:  LOPRESSOR Take 0.5 tablets (12.5 mg total) by mouth 2 (two) times daily.   ONETOUCH DELICA PLUS [id number] Misc 1 Units by Does not apply route as directed.   ONETOUCH VERIO w/Device Kit Use as directed   pantoprazole 40 MG tablet Commonly known as:  PROTONIX Take 1 tablet (40 mg total) by mouth daily.   potassium chloride 10 MEQ tablet Commonly known as:  K-DUR Take 1 tablet (10 mEq total) by  mouth daily.   promethazine 12.5 MG tablet Commonly known as:  PHENERGAN Take 1 tablet (12.5 mg total) by mouth every 6 (six) hours as needed for nausea or vomiting.   pyridostigmine 60 MG tablet Commonly known as:  MESTINON Take 60  mg by mouth 3 (three) times daily.       Allergies:  Allergies  Allergen Reactions  . Anesthetics, Amide Nausea And Vomiting  . Penicillins Diarrhea, Nausea And Vomiting and Other (See Comments)    Has patient had a PCN reaction causing immediate rash, facial/tongue/throat swelling, SOB or lightheadedness with hypotension: Yes Has patient had a PCN reaction causing severe rash involving mucus membranes or skin necrosis: No Has patient had a PCN reaction that required hospitalization No Has patient had a PCN reaction occurring within the last 10 years: Yes  If all of the above answers are "NO", then may proceed with Cephalosporin use.   . Buprenorphine Hcl Rash  . Encainide Nausea And Vomiting  . Metoclopramide Other (See Comments)    Dystonia, muscle rigidity.  Patient states she is only allergic to the "pill form, not the IV"    Past Medical History:  Diagnosis Date  . AKI (acute kidney injury) ([place])   . Anemia, iron deficiency   . Anxiety and depression 05/18/2015  . CAD (coronary artery disease), native coronary artery 01/11/2018   Non-STEMI with severe three-vessel disease noted July 2019  . Depression   . Diabetes mellitus without complication ([place])   . Diabetic gastroparesis ([place])   . Diabetic neuropathy, type I diabetes mellitus ([place]) 05/18/2015  . DKA (diabetic ketoacidoses) ([place]) 06/16/2017  . DKA, type 1 ([place]) 03/18/2016  . Essential hypertension   . Gastroparesis   . GERD (gastroesophageal reflux disease)   . HLD (hyperlipidemia)   . MDD (major depressive disorder), single episode, severe , no psychosis ([place])   . Non-ST elevation (NSTEMI) myocardial infarction ([place])   . S/P CABG x 3 01/14/2018  . Tardive dyskinesia   . Vitamin  B12 deficiency 08/16/2015    Past Surgical History:  Procedure Laterality Date  . CARDIAC SURGERY    . COLONOSCOPY  09/27/2014   at [facility]  . CORONARY ARTERY BYPASS GRAFT N/A 01/14/2018   Procedure: CORONARY ARTERY BYPASS GRAFTING (CABG)X3, RIGHT AND LEFT SAPHENOUS VEIN HARVEST, MAMMARY TAKE DOWN. MAMMARY TO LAD, SVG TO PD, SVG TO DISTAL CIRC.;  Surgeon: Grace Isaac, MD;  Location: [facility];  Service: Open Heart Surgery;  Laterality: N/A;  . ESOPHAGOGASTRODUODENOSCOPY  09/27/2014   at [facility], Dr Rolan Lipa. biospy neg for celiac, neg for H pylori.   . EYE SURGERY    . gailstones    . IR FLUORO GUIDE CV LINE RIGHT  02/01/2017  . IR FLUORO GUIDE CV LINE RIGHT  03/06/2017  . IR FLUORO GUIDE CV LINE RIGHT  03/25/2017  . IR GENERIC HISTORICAL  01/24/2016   IR FLUORO GUIDE CV LINE RIGHT 01/24/2016 Darrell K Allred, PA-C [facility]  . IR GENERIC HISTORICAL  01/24/2016   IR US GUIDE VASC ACCESS RIGHT 01/24/2016 Darrell K Allred, PA-C [facility]  . IR US GUIDE VASC ACCESS RIGHT  02/01/2017  . IR US GUIDE VASC ACCESS RIGHT  03/06/2017  . IR US GUIDE VASC ACCESS RIGHT  03/25/2017  . LEFT HEART CATH AND CORONARY ANGIOGRAPHY N/A 01/07/2018   Procedure: LEFT HEART CATH AND CORONARY ANGIOGRAPHY;  Surgeon: Troy Sine, MD;  Location: [facility];  Service: Cardiovascular;  Laterality: N/A;  . POSTERIOR VITRECTOMY AND MEMBRANE PEEL-LEFT EYE  09/28/2002  . POSTERIOR VITRECTOMY AND MEMBRANE PEEL-RIGHT EYE  03/16/2002  . RETINAL DETACHMENT SURGERY    . TEE WITHOUT CARDIOVERSION N/A 01/14/2018   Procedure: TRANSESOPHAGEAL ECHOCARDIOGRAM (TEE);  Surgeon: Grace Isaac, MD;  Location: [facility];  Service: Open Heart Surgery;  Laterality: N/A;    Family History  Problem Relation Age of Onset  . Cystic fibrosis Mother   . Hypertension Father   . Diabetes Brother   . Hypertension Maternal Grandmother     Social History:  reports that she has never smoked. She has never used smokeless tobacco. She  reports that she does not drink alcohol or use drugs.      Review of Systems  Constitutional: Negative for weight loss.  HENT: Negative for headaches.   Eyes: Positive for blurred vision.       Has history of retinal detachment, does not know if she has retinopathy  Respiratory: Negative for shortness of breath.   Cardiovascular: Negative for chest pain and leg swelling.  Gastrointestinal: Positive for nausea and constipation.       She has regular symptoms of nausea and periodic vomiting followed by gastroenterologist at [facility].  Treated with Mestinon with some improvement in symptoms.  Also has constipation, occasionally diarrhea also  Endocrine: Negative for polydipsia.  Musculoskeletal: Negative for joint pain.  Skin: Negative for rash.  Neurological: Positive for numbness.       She has pains in her legs especially lower legs and feet, only partially relieved by gabapentin.  Previously benefited from using Lyrica which was not affordable        Lipids: Taking atorvastatin for history of CAD  Lab Results  Component Value Date   CHOL 121 01/07/2018   HDL 47 01/07/2018   LDLCALC 54 01/07/2018   TRIG 101 01/07/2018   CHOLHDL 2.6 01/07/2018    Last dilated eye exam was in 7/10  DIABETES COMPLICATIONS: Gastroparesis  LABS:  Office Visit on 04/28/2018  Component Date Value Ref Range Status  . Hemoglobin A1C 04/28/2018 8.1* 4.0 - 5.6 % Final  . Microalb, Ur 04/28/2018 14.3* 0.0 - 1.9 mg/dL Final  . Creatinine,U 04/28/2018 83.3  mg/dL Final  . Microalb Creat Ratio 04/28/2018 17.1  0.0 - 30.0 mg/g Final    Physical Examination:  BP 128/76   Pulse 76   Ht 5' 1.5" (1.562 m)   Wt 134 lb (60.8 kg)   LMP 04/24/2018   SpO2 99%   BMI 24.91 kg/m   GENERAL:  HEENT:         Eye exam shows normal external appearance. Fundus exam shows no retinopathy. Oral exam shows normal mucosa .  NECK:         there is no lymphadenopathy.   Thyroid is not enlarged and no nodules  felt.   LUNGS:         Chest is symmetrical. Lungs are clear to auscultation.Marland Kitchen   HEART:         Heart sounds:  S1 and S2 are normal. No murmurs or clicks heard., no S3 or S4.   ABDOMEN:  no distention present. Liver and spleen are not palpable. No other mass or tenderness present.  EXTREMITIES:     There is no edema. No skin lesions present.Marland Kitchen  NEUROLOGICAL:        Vibration sense is moderately reduced in toes. Ankle jerks are absent bilaterally.       Diabetic Foot Exam - Simple   Simple Foot Form Diabetic Foot exam was performed with the following findings:  Yes   Visual Inspection No deformities, no ulcerations, no other skin breakdown bilaterally:  Yes Sensation Testing Intact to touch and monofilament testing bilaterally:  Yes Pulse Check Posterior  Tibialis and Dorsalis pulse intact bilaterally:  Yes Comments         MUSCULOSKELETAL:       There is no enlargement or deformity of the joints.  SKIN:       No rash, lesions or abnormal pigmentation       ASSESSMENT:  Diabetes type 1, long-standing with multiple complications  Problems identified:  Persistently high A1c over 8%  She is not taking any NovoLog for covering her carbohydrate intake or meals and this is causing postprandial hyperglycemia  Unable to download her meter which is not functioning currently and not clear what her blood sugar patterns are today  Variable food intake because of her gastroparesis  Also may have variable postprandial hyperglycemia because of longstanding gastroparesis and inconsistent digestion times  She has some degree of hypoglycemia unawareness  Complications: Gastroparesis, neuropathy  Other medical problems including history of coronary artery disease, history of major depression, anemia and B12 deficiency  PLAN:   1. Glucose monitoring: . Patient advised to check readings 4 times a day including before meals, fasting and some 2 hours after meals.  If she is able to document  testing 4 times a day she will be eligible for the freestyle libre CGM.  New prescription for test strips has been sent and she will replace the battery in her Accu-Chek meter to start testing  2.  Diabetes education: . Patient will need to be seen by diabetes educator for comprehensive education.  Discussed the need to adjust her insulin doses based on blood sugar patterns and adjusting the dose based on her carbohydrate intake which will be reviewed by nurse educator and likely needs to see the dietitian also  3.  Lifestyle changes: . Dietary changes: Needs to have balanced meals with some protein at each meal and also have 1 serving of carbohydrate each meal when taking NovoLog   4.  Insulin changes prescribed:  She will add mealtime insulin at each meal and empirically start with small doses of 2 units at breakfast and at least 2 to 3 units at other meals based on carbohydrate intake  She can continue Levemir unchanged but consider reducing the dose if fasting readings start to come down further  May consider having her take her NovoLog right after eating if she is unsure about how much she can eat at a given meal  Additional NovoLog for high sugars will be with an approximate correction factor I: 40 and given specific doses on her instructions  When eligible she can use an insulin pen for her injections instead of syringes which she is not preferring  Continue to rotate injection sites  5.  Preventive care needed:  . Follow-up eye exam.  Need to have her schedule appointment with her retinal surgeon . Check urine microalbumin today  6.  Follow-up: 4 weeks  7.  Advised that she should follow-up regularly with her gastroenterologist for gastroparesis  8.  May try to get Lyrica since it is now available in generic form and this may be better covered, will need to give her 150 mg twice daily  Patient Instructions  Check blood sugars on waking up 7 days a week  Also check blood  sugars about 2 hours after meals and do this after different meals by rotation  Recommended blood sugar levels on waking up are 90-130 and about 2 hours after meal is 130-160  Please bring your blood sugar monitor to each visit, thank you  NOVOLOG doses:  You will be taking coverage for carbohydrates along with extra insulin for high sugars at the same time  At breakfast time take at least 2 units consistently of NovoLog regardless of blood sugar for the food When eating a starch like toast or grits take a total of 4 units for breakfast  Take 2-3 units for lunch and dinner to cover the food depending on how much you eating, may only need 1 unit for just a snack at lunchtime  SLIDING scale:  Add the following amount for high sugars Blood sugar range 150-180 is 1 unit 180-220 =2 units 220-250 units =  3 units Over 250 =4 units      Counseling time on subjects discussed in assessment and plan sections is over 50% of today's 60 minute visit   Elayne Snare 04/29/2018, 11:37 AM   Copy of consultation sent to referring physician   Note: This note was prepared with Dragon voice recognition system technology. Any transcriptional errors that result from this process are unintentional.

## 2018-04-28 ENCOUNTER — Encounter: Payer: Self-pay | Admitting: Endocrinology

## 2018-04-28 ENCOUNTER — Ambulatory Visit (INDEPENDENT_AMBULATORY_CARE_PROVIDER_SITE_OTHER): Payer: Medicare Other | Admitting: Endocrinology

## 2018-04-28 VITALS — BP 128/76 | HR 76 | Ht 61.5 in | Wt 134.0 lb

## 2018-04-28 DIAGNOSIS — I2510 Atherosclerotic heart disease of native coronary artery without angina pectoris: Secondary | ICD-10-CM | POA: Diagnosis not present

## 2018-04-28 DIAGNOSIS — E1143 Type 2 diabetes mellitus with diabetic autonomic (poly)neuropathy: Secondary | ICD-10-CM

## 2018-04-28 DIAGNOSIS — E1043 Type 1 diabetes mellitus with diabetic autonomic (poly)neuropathy: Secondary | ICD-10-CM

## 2018-04-28 DIAGNOSIS — K3184 Gastroparesis: Secondary | ICD-10-CM

## 2018-04-28 DIAGNOSIS — E1065 Type 1 diabetes mellitus with hyperglycemia: Secondary | ICD-10-CM

## 2018-04-28 LAB — MICROALBUMIN / CREATININE URINE RATIO
CREATININE, U: 83.3 mg/dL
MICROALB UR: 14.3 mg/dL — AB (ref 0.0–1.9)
MICROALB/CREAT RATIO: 17.1 mg/g (ref 0.0–30.0)

## 2018-04-28 LAB — POCT GLYCOSYLATED HEMOGLOBIN (HGB A1C): Hemoglobin A1C: 8.1 % — AB (ref 4.0–5.6)

## 2018-04-28 MED ORDER — "INSULIN SYRINGE-NEEDLE U-100 31G X 5/16"" 0.5 ML MISC": 5 refills | Status: DC

## 2018-04-28 MED ORDER — GLUCOSE BLOOD VI STRP: ORAL_STRIP | 12 refills | Status: DC

## 2018-04-28 NOTE — Patient Instructions (Addendum)
Check blood sugars on waking up 7 days a week  Also check blood sugars about 2 hours after meals and do this after different meals by rotation  Recommended blood sugar levels on waking up are 90-130 and about 2 hours after meal is 130-160  Please bring your blood sugar monitor to each visit, thank you  NOVOLOG doses:  You will be taking coverage for carbohydrates along with extra insulin for high sugars at the same time  At breakfast time take at least 2 units consistently of NovoLog regardless of blood sugar for the food When eating a starch like toast or grits take a total of 4 units for breakfast  Take 2-3 units for lunch and dinner to cover the food depending on how much you eating, may only need 1 unit for just a snack at lunchtime  SLIDING scale:  Add the following amount for high sugars Blood sugar range 150-180 is 1 unit 180-220 =2 units 220-250 units =  3 units Over 250 =4 units

## 2018-04-30 ENCOUNTER — Other Ambulatory Visit: Payer: Self-pay

## 2018-04-30 ENCOUNTER — Inpatient Hospital Stay (HOSPITAL_COMMUNITY)
Admission: EM | Admit: 2018-04-30 | Discharge: 2018-05-02 | DRG: 638 | Disposition: A | Discharge status: Home Health | Payer: Medicare Other | Attending: Internal Medicine | Admitting: Internal Medicine

## 2018-04-30 ENCOUNTER — Other Ambulatory Visit: Payer: Self-pay | Admitting: Endocrinology

## 2018-04-30 ENCOUNTER — Encounter (HOSPITAL_COMMUNITY): Payer: Self-pay

## 2018-04-30 ENCOUNTER — Inpatient Hospital Stay: Payer: Self-pay

## 2018-04-30 DIAGNOSIS — I5032 Chronic diastolic (congestive) heart failure: Secondary | ICD-10-CM | POA: Diagnosis not present

## 2018-04-30 DIAGNOSIS — E1143 Type 2 diabetes mellitus with diabetic autonomic (poly)neuropathy: Secondary | ICD-10-CM | POA: Diagnosis present

## 2018-04-30 DIAGNOSIS — I509 Heart failure, unspecified: Secondary | ICD-10-CM | POA: Diagnosis present

## 2018-04-30 DIAGNOSIS — Z8249 Family history of ischemic heart disease and other diseases of the circulatory system: Secondary | ICD-10-CM

## 2018-04-30 DIAGNOSIS — Z794 Long term (current) use of insulin: Secondary | ICD-10-CM

## 2018-04-30 DIAGNOSIS — N179 Acute kidney failure, unspecified: Secondary | ICD-10-CM | POA: Diagnosis present

## 2018-04-30 DIAGNOSIS — Z951 Presence of aortocoronary bypass graft: Secondary | ICD-10-CM

## 2018-04-30 DIAGNOSIS — Z880 Allergy status to penicillin: Secondary | ICD-10-CM | POA: Diagnosis not present

## 2018-04-30 DIAGNOSIS — I959 Hypotension, unspecified: Secondary | ICD-10-CM | POA: Diagnosis not present

## 2018-04-30 DIAGNOSIS — E538 Deficiency of other specified B group vitamins: Secondary | ICD-10-CM | POA: Diagnosis present

## 2018-04-30 DIAGNOSIS — D509 Iron deficiency anemia, unspecified: Secondary | ICD-10-CM | POA: Diagnosis present

## 2018-04-30 DIAGNOSIS — Z79899 Other long term (current) drug therapy: Secondary | ICD-10-CM

## 2018-04-30 DIAGNOSIS — I252 Old myocardial infarction: Secondary | ICD-10-CM | POA: Diagnosis not present

## 2018-04-30 DIAGNOSIS — K3184 Gastroparesis: Secondary | ICD-10-CM | POA: Diagnosis present

## 2018-04-30 DIAGNOSIS — F329 Major depressive disorder, single episode, unspecified: Secondary | ICD-10-CM | POA: Diagnosis present

## 2018-04-30 DIAGNOSIS — Z888 Allergy status to other drugs, medicaments and biological substances status: Secondary | ICD-10-CM

## 2018-04-30 DIAGNOSIS — I2510 Atherosclerotic heart disease of native coronary artery without angina pectoris: Secondary | ICD-10-CM | POA: Diagnosis present

## 2018-04-30 DIAGNOSIS — Z884 Allergy status to anesthetic agent status: Secondary | ICD-10-CM

## 2018-04-30 DIAGNOSIS — Z452 Encounter for adjustment and management of vascular access device: Secondary | ICD-10-CM | POA: Diagnosis not present

## 2018-04-30 DIAGNOSIS — Z8349 Family history of other endocrine, nutritional and metabolic diseases: Secondary | ICD-10-CM | POA: Diagnosis not present

## 2018-04-30 DIAGNOSIS — K219 Gastro-esophageal reflux disease without esophagitis: Secondary | ICD-10-CM | POA: Diagnosis present

## 2018-04-30 DIAGNOSIS — E785 Hyperlipidemia, unspecified: Secondary | ICD-10-CM | POA: Diagnosis present

## 2018-04-30 DIAGNOSIS — E1165 Type 2 diabetes mellitus with hyperglycemia: Secondary | ICD-10-CM | POA: Diagnosis not present

## 2018-04-30 DIAGNOSIS — Z833 Family history of diabetes mellitus: Secondary | ICD-10-CM | POA: Diagnosis not present

## 2018-04-30 DIAGNOSIS — R112 Nausea with vomiting, unspecified: Secondary | ICD-10-CM | POA: Diagnosis not present

## 2018-04-30 DIAGNOSIS — I110 Hypertensive heart disease with heart failure: Secondary | ICD-10-CM | POA: Diagnosis present

## 2018-04-30 DIAGNOSIS — Z7982 Long term (current) use of aspirin: Secondary | ICD-10-CM | POA: Diagnosis not present

## 2018-04-30 DIAGNOSIS — F419 Anxiety disorder, unspecified: Secondary | ICD-10-CM | POA: Diagnosis present

## 2018-04-30 DIAGNOSIS — I444 Left anterior fascicular block: Secondary | ICD-10-CM | POA: Diagnosis present

## 2018-04-30 DIAGNOSIS — R0689 Other abnormalities of breathing: Secondary | ICD-10-CM | POA: Diagnosis not present

## 2018-04-30 DIAGNOSIS — E1110 Type 2 diabetes mellitus with ketoacidosis without coma: Secondary | ICD-10-CM | POA: Diagnosis not present

## 2018-04-30 DIAGNOSIS — I10 Essential (primary) hypertension: Secondary | ICD-10-CM | POA: Diagnosis not present

## 2018-04-30 DIAGNOSIS — R0902 Hypoxemia: Secondary | ICD-10-CM | POA: Diagnosis not present

## 2018-04-30 DIAGNOSIS — E1010 Type 1 diabetes mellitus with ketoacidosis without coma: Secondary | ICD-10-CM | POA: Diagnosis not present

## 2018-04-30 DIAGNOSIS — R000 Tachycardia, unspecified: Secondary | ICD-10-CM | POA: Diagnosis not present

## 2018-04-30 LAB — CBC WITH DIFFERENTIAL/PLATELET
Basophils Absolute: 0 10*3/uL (ref 0.0–0.1)
Basophils Relative: 0 %
EOS PCT: 0 %
Eosinophils Absolute: 0 10*3/uL (ref 0.0–0.5)
HEMATOCRIT: 42.4 % (ref 36.0–46.0)
HEMOGLOBIN: 12.7 g/dL (ref 12.0–15.0)
LYMPHS PCT: 3 %
Lymphs Abs: 0.5 10*3/uL — ABNORMAL LOW (ref 0.7–4.0)
MCH: 28.1 pg (ref 26.0–34.0)
MCHC: 30 g/dL (ref 30.0–36.0)
MCV: 93.8 fL (ref 80.0–100.0)
MONOS PCT: 3 %
Monocytes Absolute: 0.5 10*3/uL (ref 0.1–1.0)
NEUTROS ABS: 17.1 10*3/uL — AB (ref 1.7–7.7)
Neutrophils Relative %: 94 %
Platelets: 387 10*3/uL (ref 150–400)
RBC: 4.52 MIL/uL (ref 3.87–5.11)
RDW: 13.8 % (ref 11.5–15.5)
WBC: 18.2 10*3/uL — AB (ref 4.0–10.5)
nRBC: 0 % (ref 0.0–0.2)
nRBC: 0 /100 WBC

## 2018-04-30 LAB — CBG MONITORING, ED
Glucose-Capillary: 549 mg/dL (ref 70–99)
Glucose-Capillary: 339 mg/dL — ABNORMAL HIGH (ref 70–99)
Glucose-Capillary: >600 mg/dL (ref 70–99)
Glucose-Capillary: >600 mg/dL (ref 70–99)
Glucose-Capillary: >600 mg/dL (ref 70–99)

## 2018-04-30 LAB — I-STAT VENOUS BLOOD GAS, ED
ACID-BASE DEFICIT: 15 mmol/L — AB (ref 0.0–2.0)
BICARBONATE: 12.8 mmol/L — AB (ref 20.0–28.0)
O2 SAT: 82 %
PCO2 VEN: 34.4 mmHg — AB (ref 44.0–60.0)
PO2 VEN: 58 mmHg — AB (ref 32.0–45.0)
TCO2: 14 mmol/L — AB (ref 22–32)
pH, Ven: 7.178 — CL (ref 7.250–7.430)

## 2018-04-30 LAB — COMPREHENSIVE METABOLIC PANEL
ALT: 27 U/L (ref 0–44)
AST: 28 U/L (ref 15–41)
Albumin: 4 g/dL (ref 3.5–5.0)
Alkaline Phosphatase: 104 U/L (ref 38–126)
Anion gap: 27 — ABNORMAL HIGH (ref 5–15)
BILIRUBIN TOTAL: 2.7 mg/dL — AB (ref 0.3–1.2)
BUN: 36 mg/dL — AB (ref 6–20)
CALCIUM: 9.4 mg/dL (ref 8.9–10.3)
CHLORIDE: 101 mmol/L (ref 98–111)
CO2: 8 mmol/L — AB (ref 22–32)
CREATININE: 2.22 mg/dL — AB (ref 0.44–1.00)
GFR, EST AFRICAN AMERICAN: 29 mL/min — AB (ref >60)
GFR, EST NON AFRICAN AMERICAN: 25 mL/min — AB (ref >60)
Glucose, Bld: 737 mg/dL (ref 70–99)
Potassium: 5.7 mmol/L — ABNORMAL HIGH (ref 3.5–5.1)
Sodium: 136 mmol/L (ref 135–145)
Total Protein: 7.5 g/dL (ref 6.5–8.1)

## 2018-04-30 LAB — BASIC METABOLIC PANEL
ANION GAP: 16 — AB (ref 5–15)
BUN: 44 mg/dL — ABNORMAL HIGH (ref 6–20)
CO2: 14 mmol/L — AB (ref 22–32)
Calcium: 8.2 mg/dL — ABNORMAL LOW (ref 8.9–10.3)
Chloride: 109 mmol/L (ref 98–111)
Creatinine, Ser: 2.57 mg/dL — ABNORMAL HIGH (ref 0.44–1.00)
GFR calc Af Amer: 24 mL/min — ABNORMAL LOW (ref >60)
GFR calc non Af Amer: 21 mL/min — ABNORMAL LOW (ref >60)
GLUCOSE: 454 mg/dL — AB (ref 70–99)
POTASSIUM: 3.6 mmol/L (ref 3.5–5.1)
Sodium: 139 mmol/L (ref 135–145)

## 2018-04-30 LAB — I-STAT CHEM 8, ED
BUN: 32 mg/dL — AB (ref 6–20)
CHLORIDE: 106 mmol/L (ref 98–111)
CREATININE: 1.6 mg/dL — AB (ref 0.44–1.00)
Calcium, Ion: 1.09 mmol/L — ABNORMAL LOW (ref 1.15–1.40)
GLUCOSE: 648 mg/dL — AB (ref 70–99)
HCT: 46 % (ref 36.0–46.0)
HEMOGLOBIN: 15.6 g/dL — AB (ref 12.0–15.0)
Potassium: 5.1 mmol/L (ref 3.5–5.1)
Sodium: 136 mmol/L (ref 135–145)
TCO2: 15 mmol/L — ABNORMAL LOW (ref 22–32)

## 2018-04-30 LAB — GLUCOSE, CAPILLARY
GLUCOSE-CAPILLARY: 523 mg/dL — AB (ref 70–99)
GLUCOSE-CAPILLARY: 432 mg/dL — AB (ref 70–99)
Glucose-Capillary: 371 mg/dL — ABNORMAL HIGH (ref 70–99)

## 2018-04-30 LAB — LIPASE, BLOOD: Lipase: 21 U/L (ref 11–51)

## 2018-04-30 LAB — I-STAT BETA HCG BLOOD, ED (MC, WL, AP ONLY)

## 2018-04-30 MED ORDER — ONDANSETRON HCL 4 MG/2ML IJ SOLN
4.0000 mg | Freq: Once | INTRAMUSCULAR | Status: AC
  Administered 2018-04-30: 4 mg via INTRAVENOUS
  Filled 2018-04-30: qty 2

## 2018-04-30 MED ORDER — SODIUM CHLORIDE 0.9 % IV SOLN
INTRAVENOUS | Status: DC
  Administered 2018-05-01: via INTRAVENOUS

## 2018-04-30 MED ORDER — DEXTROSE-NACL 5-0.45 % IV SOLN: INTRAVENOUS | Status: DC

## 2018-04-30 MED ORDER — DEXTROSE-NACL 5-0.45 % IV SOLN
INTRAVENOUS | Status: DC
  Administered 2018-05-01: 02:00:00 via INTRAVENOUS

## 2018-04-30 MED ORDER — LORAZEPAM 1 MG PO TABS
1.0000 mg | ORAL_TABLET | Freq: Once | ORAL | Status: AC
  Administered 2018-04-30: 1 mg via ORAL
  Filled 2018-04-30: qty 1

## 2018-04-30 MED ORDER — DIPHENHYDRAMINE HCL 25 MG PO CAPS: 25.0000 mg | ORAL_CAPSULE | Freq: Four times a day (QID) | ORAL | Status: DC | PRN

## 2018-04-30 MED ORDER — ENOXAPARIN SODIUM 40 MG/0.4ML ~~LOC~~ SOLN
40.0000 mg | SUBCUTANEOUS | Status: DC
  Administered 2018-04-30: 40 mg via SUBCUTANEOUS
  Filled 2018-04-30: qty 0.4

## 2018-04-30 MED ORDER — PREGABALIN 150 MG PO CAPS: 150.0000 mg | ORAL_CAPSULE | Freq: Two times a day (BID) | ORAL | 1 refills | Status: DC

## 2018-04-30 MED ORDER — INSULIN REGULAR(HUMAN) IN NACL 100-0.9 UT/100ML-% IV SOLN
INTRAVENOUS | Status: DC
  Administered 2018-04-30: 12.4 [IU]/h via INTRAVENOUS
  Filled 2018-04-30: qty 100

## 2018-04-30 MED ORDER — SODIUM CHLORIDE 0.9 % IV BOLUS
1000.0000 mL | Freq: Once | INTRAVENOUS | Status: AC
  Administered 2018-04-30: 1000 mL via INTRAVENOUS

## 2018-04-30 MED ORDER — BUSPIRONE HCL 5 MG PO TABS
10.0000 mg | ORAL_TABLET | Freq: Three times a day (TID) | ORAL | Status: DC
  Administered 2018-04-30 – 2018-05-02 (×5): 10 mg via ORAL
  Filled 2018-04-30 (×5): qty 2

## 2018-04-30 MED ORDER — PANTOPRAZOLE SODIUM 40 MG PO TBEC
40.0000 mg | DELAYED_RELEASE_TABLET | Freq: Every day | ORAL | Status: DC
  Administered 2018-04-30 – 2018-05-02 (×3): 40 mg via ORAL
  Filled 2018-04-30 (×3): qty 1

## 2018-04-30 MED ORDER — ATORVASTATIN CALCIUM 80 MG PO TABS
80.0000 mg | ORAL_TABLET | Freq: Every day | ORAL | Status: DC
  Administered 2018-05-01: 80 mg via ORAL
  Filled 2018-04-30: qty 1

## 2018-04-30 MED ORDER — SODIUM CHLORIDE 0.9 % IV SOLN
Freq: Once | INTRAVENOUS | Status: AC
  Administered 2018-04-30: 100 mL/h via INTRAVENOUS

## 2018-04-30 MED ORDER — SODIUM CHLORIDE 0.9 % IV SOLN
INTRAVENOUS | Status: AC
  Administered 2018-04-30 (×2): via INTRAVENOUS

## 2018-04-30 MED ORDER — ASPIRIN EC 325 MG PO TBEC
325.0000 mg | DELAYED_RELEASE_TABLET | Freq: Every day | ORAL | Status: DC
  Administered 2018-04-30 – 2018-05-02 (×3): 325 mg via ORAL
  Filled 2018-04-30 (×3): qty 1

## 2018-04-30 MED ORDER — DIPHENHYDRAMINE HCL 50 MG/ML IJ SOLN
25.0000 mg | Freq: Once | INTRAMUSCULAR | Status: AC
  Administered 2018-04-30: 25 mg via INTRAVENOUS
  Filled 2018-04-30: qty 1

## 2018-04-30 MED ORDER — PROMETHAZINE HCL 25 MG/ML IJ SOLN
12.5000 mg | Freq: Once | INTRAMUSCULAR | Status: AC
  Administered 2018-04-30: 12.5 mg via INTRAVENOUS
  Filled 2018-04-30 (×2): qty 1

## 2018-04-30 MED ORDER — INSULIN REGULAR(HUMAN) IN NACL 100-0.9 UT/100ML-% IV SOLN
INTRAVENOUS | Status: DC
  Administered 2018-04-30: 5.4 [IU]/h via INTRAVENOUS
  Filled 2018-04-30: qty 100

## 2018-04-30 MED ORDER — GABAPENTIN 600 MG PO TABS: 600.0000 mg | ORAL_TABLET | Freq: Two times a day (BID) | ORAL | Status: DC

## 2018-04-30 MED ORDER — ACETAMINOPHEN 325 MG PO TABS: 650.0000 mg | ORAL_TABLET | Freq: Four times a day (QID) | ORAL | Status: DC | PRN

## 2018-04-30 MED ORDER — METOPROLOL TARTRATE 12.5 MG HALF TABLET
12.5000 mg | ORAL_TABLET | Freq: Two times a day (BID) | ORAL | Status: DC
  Administered 2018-04-30: 12.5 mg via ORAL
  Filled 2018-04-30 (×2): qty 1

## 2018-04-30 MED ORDER — PROMETHAZINE HCL 25 MG PO TABS: 12.5000 mg | ORAL_TABLET | Freq: Four times a day (QID) | ORAL | Status: DC | PRN

## 2018-04-30 MED ORDER — PREGABALIN 75 MG PO CAPS
150.0000 mg | ORAL_CAPSULE | Freq: Two times a day (BID) | ORAL | Status: DC
Start: 2018-04-30 — End: 2018-05-02
  Administered 2018-04-30 – 2018-05-02 (×4): 150 mg via ORAL
  Filled 2018-04-30 (×4): qty 2

## 2018-04-30 NOTE — H&P (Signed)
[facility]                                                                                    Patient Demographics  [name], is a 52 y.o. female  CSN: [id number]  MRN: [id number]  DOB - 30-May-1966  Admit Date - 04/30/2018  Outpatient Primary MD for the patient is [name], MD   With History of -  Past Medical History:  Diagnosis Date  . AKI (acute kidney injury) ([place])   . Anemia, iron deficiency   . Anxiety and depression 05/18/2015  . CAD (coronary artery disease), native coronary artery 01/11/2018   Non-STEMI with severe three-vessel disease noted July 2019  . Depression   . Diabetes mellitus without complication ([place])   . Diabetic gastroparesis ([place])   . Diabetic neuropathy, type I diabetes mellitus ([place]) 05/18/2015  . DKA (diabetic ketoacidoses) ([place]) 06/16/2017  . DKA, type 1 ([place]) 03/18/2016  . Essential hypertension   . Gastroparesis   . GERD (gastroesophageal reflux disease)   . HLD (hyperlipidemia)   . MDD (major depressive disorder), single episode, severe , no psychosis ([employer])   . Non-ST elevation (NSTEMI) myocardial infarction ([place])   . S/P CABG x 3 01/14/2018  . Tardive dyskinesia   . Vitamin B12 deficiency 08/16/2015      Past Surgical History:  Procedure Laterality Date  . CARDIAC SURGERY    . COLONOSCOPY  09/27/2014   at [facility]  . CORONARY ARTERY BYPASS GRAFT N/A 01/14/2018   Procedure: CORONARY ARTERY BYPASS GRAFTING (CABG)X3, RIGHT AND LEFT SAPHENOUS VEIN HARVEST, MAMMARY TAKE DOWN. MAMMARY TO LAD, SVG TO PD, SVG TO DISTAL CIRC.;  Surgeon: Grace Isaac, MD;  Location: [facility];  Service: Open Heart Surgery;  Laterality: N/A;  . ESOPHAGOGASTRODUODENOSCOPY  09/27/2014   at [facility], Dr Rolan Lipa. biospy neg for celiac, neg for H pylori.   . EYE SURGERY    . gailstones    . IR FLUORO GUIDE CV LINE RIGHT  02/01/2017  . IR FLUORO GUIDE CV LINE RIGHT  03/06/2017  . IR FLUORO GUIDE CV LINE RIGHT  03/25/2017  . IR GENERIC  HISTORICAL  01/24/2016   IR FLUORO GUIDE CV LINE RIGHT 01/24/2016 Darrell K Allred, PA-C [facility]  . IR GENERIC HISTORICAL  01/24/2016   IR US GUIDE VASC ACCESS RIGHT 01/24/2016 Darrell K Allred, PA-C [facility]  . IR US GUIDE VASC ACCESS RIGHT  02/01/2017  . IR US GUIDE VASC ACCESS RIGHT  03/06/2017  . IR US GUIDE VASC ACCESS RIGHT  03/25/2017  . LEFT HEART CATH AND CORONARY ANGIOGRAPHY N/A 01/07/2018   Procedure: LEFT HEART CATH AND CORONARY ANGIOGRAPHY;  Surgeon: Troy Sine, MD;  Location: [facility];  Service: Cardiovascular;  Laterality: N/A;  . POSTERIOR VITRECTOMY AND MEMBRANE PEEL-LEFT EYE  09/28/2002  . POSTERIOR VITRECTOMY AND MEMBRANE PEEL-RIGHT EYE  03/16/2002  . RETINAL DETACHMENT SURGERY    . TEE WITHOUT CARDIOVERSION N/A 01/14/2018   Procedure: TRANSESOPHAGEAL ECHOCARDIOGRAM (TEE);  Surgeon: Grace Isaac, MD;  Location: [place];  Service: Open Heart Surgery;  Laterality: N/A;    in for   Chief Complaint  Patient presents with  . Hyperglycemia  . Emesis     HPI  [name]  is a 52 y.o. female, with past medical history significant for insulin-dependent diabetes mellitus coronary artery disease status post CABG x3, hypertension and gastroparesis presenting with 3 days history of abdominal pain, nausea and vomiting.  The patient has a history of being admitted for DKA.  The vomiting is nonbloody and nonbilious.  Patient denies any history of fever chills cough or shortness of breath.  No urinary symptoms.  In the emergency room the patient was found to be in DKA with elevated anion gap of 27 and blood sugar of 6-700 and a potassium of 5.7.    Review of Systems    Unable to obtain due to patient's condition  Social History Social History   Tobacco Use  . Smoking status: Never Smoker  . Smokeless tobacco: Never Used  Substance Use Topics  . Alcohol use: No     Family History Family History  Problem Relation Age of Onset  . Cystic fibrosis Mother    . Hypertension Father   . Diabetes Brother   . Hypertension Maternal Grandmother      Prior to Admission medications   Medication Sig Start Date End Date Taking? Authorizing Provider  acetaminophen (TYLENOL) 325 MG tablet Take 2 tablets (650 mg total) by mouth every 6 (six) hours as needed for mild pain. 01/25/18  Yes Elgie Collard, PA-C  aspirin EC 325 MG EC tablet Take 1 tablet (325 mg total) by mouth daily. 01/25/18  Yes Conte, Tessa N, PA-C  busPIRone (BUSPAR) 10 MG tablet Take 1 tablet (10 mg total) by mouth 3 (three) times daily. 03/20/18  Yes [name], MD  diphenhydrAMINE (BENADRYL) 25 mg capsule Take 25 mg by mouth every 6 (six) hours as needed for allergies.   Yes [provider]  docusate sodium (COLACE) 100 MG capsule Take 1 capsule (100 mg total) by mouth daily as needed for mild constipation. 02/03/18  Yes Burtis Junes, NP  furosemide (LASIX) 40 MG tablet Take 1 tablet (40 mg total) by mouth daily. Patient taking differently: Take 40 mg by mouth daily as needed for fluid or edema.  03/20/18 03/15/19 Yes Newlin, [name], MD  insulin aspart (NOVOLOG) 100 UNIT/ML injection Inject 0-12 Units into the skin 4 (four) times daily -  before meals and at bedtime. Uses a sliding scale: 70-119 = 0 units, 120-150 = 2 units, 151-180 =  4 units, 181-210 = 5 units, 211-240 = 6 units, 241-270 = 8 units, 271-300 = 9 units, 301-330 = 10 units, 331-360 = 11 units, 361-400 = 12 units, greater than 400 notify MD.   Yes [provider]  insulin detemir (LEVEMIR) 100 UNIT/ML injection Inject 0.15 mLs (15 Units total) into the skin every morning. 03/20/18  Yes Newlin, Enobong, MD  loperamide (IMODIUM) 2 MG capsule Take 1 capsule (2 mg total) by mouth as needed for diarrhea or loose stools. 04/15/18  Yes Oretha Milch D, MD  metoprolol tartrate (LOPRESSOR) 25 MG tablet Take 0.5 tablets (12.5 mg total) by mouth 2 (two) times daily. 03/20/18  Yes [name], MD  pantoprazole  (PROTONIX) 40 MG tablet Take 1 tablet (40 mg total) by mouth daily. 03/20/18  Yes [name], MD  potassium chloride (K-DUR) 10 MEQ tablet Take 1 tablet (10 mEq total) by mouth daily. Patient taking differently: Take 10 mEq by mouth daily as needed (low potassium).  03/20/18  Yes Newlin, Enobong,  MD  pregabalin (LYRICA) 150 MG capsule Take 1 capsule (150 mg total) by mouth 2 (two) times daily. 04/30/18  Yes Elayne Snare, MD  promethazine (PHENERGAN) 12.5 MG tablet Take 1 tablet (12.5 mg total) by mouth every 6 (six) hours as needed for nausea or vomiting. 03/20/18  Yes [name], MD  pyridostigmine (MESTINON) 60 MG tablet Take 60 mg by mouth 3 (three) times daily.   Yes [provider]  atorvastatin (LIPITOR) 80 MG tablet Take 1 tablet (80 mg total) by mouth daily at 6 PM. Patient not taking: Reported on 04/30/2018 03/20/18   [name], MD  Blood Glucose Monitoring Suppl (ONETOUCH VERIO) w/Device KIT Use as directed 03/21/18   [name], MD  glucose blood test strip Use as instructed 03/21/18   [name], MD  glucose blood test strip Check sugar 4 times daily. 04/28/18   Elayne Snare, MD  Insulin Syringe-Needle U-100 (BD INSULIN SYRINGE ULTRAFINE) 31G X 5/16" 0.5 ML MISC 1 each by Does not apply route 4 (four) times daily. 04/28/18   Elayne Snare, MD  Lancets [facility] DELICA PLUS [id number]) [place] 1 Units by Does not apply route as directed. 03/21/18   [name], MD    Allergies  Allergen Reactions  . Anesthetics, Amide Nausea And Vomiting  . Penicillins Diarrhea, Nausea And Vomiting and Other (See Comments)    Has patient had a PCN reaction causing immediate rash, facial/tongue/throat swelling, SOB or lightheadedness with hypotension: Yes Has patient had a PCN reaction causing severe rash involving mucus membranes or skin necrosis: No Has patient had a PCN reaction that required hospitalization No Has patient had a PCN reaction occurring within the last 10 years:  Yes  If all of the above answers are "NO", then may proceed with Cephalosporin use.   . Buprenorphine Hcl Rash  . Encainide Nausea And Vomiting  . Metoclopramide Other (See Comments)    Dystonia, muscle rigidity.  Patient states she is only allergic to the "pill form, not the IV"    Physical Exam  Vitals  Blood pressure 114/82, pulse (!) 118, temperature 97.9 F (36.6 C), temperature source Oral, resp. rate (!) 21, last menstrual period 04/24/2018, SpO2 94 %.   1. General well-developed, well-nourished  2.  Drowsy, confused.  3. No F.N deficits, grossly, patient moving all extremities.  4. Ears and Eyes appear Normal, Conjunctivae clear, PERRLA.  Dry oral Mucosa.  5. Supple Neck, No JVD, No cervical lymphadenopathy appriciated, No Carotid Bruits.  6. Symmetrical Chest wall movement, Good air movement bilaterally, CTAB.  Scar of CABG noted and well-healed  7.  Sinus tach ,RRR, No Gallops, Rubs or Murmurs, No Parasternal Heave.  8. Positive Bowel Sounds, Abdomen Soft, Non tender, No organomegaly appriciated,No rebound -guarding or rigidity.  9.  No Cyanosis, Normal Skin Turgor, No Skin Rash or Bruise.  10. Good muscle tone,  joints appear normal , no effusions, Normal ROM.    Data Review  CBC Recent Labs  Lab 04/30/18 1017 04/30/18 1039  WBC 18.2*  --   HGB 12.7 15.6*  HCT 42.4 46.0  PLT 387  --   MCV 93.8  --   MCH 28.1  --   MCHC 30.0  --   RDW 13.8  --   LYMPHSABS 0.5*  --   MONOABS 0.5  --   EOSABS 0.0  --   BASOSABS 0.0  --    ------------------------------------------------------------------------------------------------------------------  Chemistries  Recent Labs  Lab 04/30/18 1017 04/30/18 1039  NA  136 136  K 5.7* 5.1  CL 101 106  CO2 8*  --   GLUCOSE 737* 648*  BUN 36* 32*  CREATININE 2.22* 1.60*  CALCIUM 9.4  --   AST 28  --   ALT 27  --   ALKPHOS 104  --   BILITOT 2.7*  --     ------------------------------------------------------------------------------------------------------------------ estimated creatinine clearance is 35.7 mL/min (A) (by C-G formula based on SCr of 1.6 mg/dL (H)). ------------------------------------------------------------------------------------------------------------------ No results for input(s): TSH, T4TOTAL, T3FREE, THYROIDAB in the last 72 hours.  Invalid input(s): FREET3   Coagulation profile No results for input(s): INR, PROTIME in the last 168 hours. ------------------------------------------------------------------------------------------------------------------- No results for input(s): DDIMER in the last 72 hours. -------------------------------------------------------------------------------------------------------------------  Cardiac Enzymes No results for input(s): CKMB, TROPONINI, MYOGLOBIN in the last 168 hours.  Invalid input(s): CK ------------------------------------------------------------------------------------------------------------------ Invalid input(s): POCBNP   ---------------------------------------------------------------------------------------------------------------  Urinalysis    Component Value Date/Time   COLORURINE YELLOW 04/14/2018 0008   APPEARANCEUR HAZY (A) 04/14/2018 0008   LABSPEC 1.024 04/14/2018 0008   PHURINE 5.0 04/14/2018 0008   GLUCOSEU >=500 (A) 04/14/2018 0008   HGBUR MODERATE (A) 04/14/2018 0008   BILIRUBINUR NEGATIVE 04/14/2018 0008   BILIRUBINUR neg 01/17/2017 1533   KETONESUR 20 (A) 04/14/2018 0008   PROTEINUR 100 (A) 04/14/2018 0008   UROBILINOGEN 0.2 01/17/2017 1533   UROBILINOGEN 0.2 04/27/2015 1608   NITRITE NEGATIVE 04/14/2018 0008   LEUKOCYTESUR NEGATIVE 04/14/2018 0008    ----------------------------------------------------------------------------------------------------------------   Imaging results:   No results found.  My personal review of EKG:  Sinus tach at 110 bpm with left axis deviation and left anterior fascicular block and old anterior MI    Assessment & Plan   Diabetic ketoacidosis Continue with IV fluids and insulin drip Patient has a history of noncompliance  Hypertension continue with Lopressor  Coronary artery disease status post CABG in July 2019  Congestive heart failure, hold Lasix due to DKA  Anxiety/depression continue with BuSpar  History of gastroparesis    Code Status full  Disposition Plan: Home  Time spent in minutes : 46 minutes  Condition GUARDED   @SIGNATURE @

## 2018-04-30 NOTE — ED Triage Notes (Signed)
[facility]- pt coming from home with complaint of hyperglycemia and vomiting. Vitals table with EMS. CBG 437. Pt received 4mg  of zofran PTA.

## 2018-04-30 NOTE — ED Notes (Signed)
Long, MD at bedside attempting groin central line placement

## 2018-04-30 NOTE — ED Notes (Signed)
Second Phlebotomist at bedside attempting lab collection, IV Team RN at bedside attempting IV access

## 2018-04-30 NOTE — ED Provider Notes (Signed)
[facility] EMERGENCY DEPARTMENT Provider Note   CSN: [id number] Arrival date & time: 04/30/18  1009     History   Chief Complaint Chief Complaint  Patient presents with  . Hyperglycemia  . Emesis    HPI [name] is a 52 y.o. female with a past medical history significant for insulin-dependent type 1 diabetes, gastroparesis with prior intractable vomiting, and STEMI s/p CABG x 3, HTN, tardive dyskinesia who presents emergency department today by EMS from home for abdominal pain, hyperglycemia, nausea and vomiting.  History is provided by patient as well as EMS.  History is mildly limited as patient is a poor historian.  Patient reports she started developing generalized abdominal pain with nausea and nonbilious, nonbloody emesis yesterday.  Her abdominal pain and vomiting feels similar to her history of gastroparesis in the past.  She notes she has been compliant with her insulin regimen.  Patient denies any recent illnesses.  Patient denies any associated fever, chills, chest pain, shortness of breath, cough, urinary frequency/urgency, dysuria or hematuria.  She notes normal bowel habits (last bowel movement was upon arrival). Patient does have a significant history of DKA with 8 hospitalizations for this in the past 1 year.  Patient was recently admitted on 10/20 for intractable vomiting. Patient is followed by [facility] but has not followed up in some time.   HPI  Past Medical History:  Diagnosis Date  . AKI (acute kidney injury) ([place])   . Anemia, iron deficiency   . Anxiety and depression 05/18/2015  . CAD (coronary artery disease), native coronary artery 01/11/2018   Non-STEMI with severe three-vessel disease noted July 2019  . Depression   . Diabetes mellitus without complication ([place])   . Diabetic gastroparesis ([place])   . Diabetic neuropathy, type I diabetes mellitus ([place]) 05/18/2015  . DKA (diabetic ketoacidoses) ([place]) 06/16/2017  . DKA, type 1  ([place]) 03/18/2016  . Essential hypertension   . Gastroparesis   . GERD (gastroesophageal reflux disease)   . HLD (hyperlipidemia)   . MDD (major depressive disorder), single episode, severe , no psychosis ([place])   . Non-ST elevation (NSTEMI) myocardial infarction ([place])   . S/P CABG x 3 01/14/2018  . Tardive dyskinesia   . Vitamin B12 deficiency 08/16/2015    Patient Active Problem List   Diagnosis Date Noted  . Gastroparesis 04/13/2018  . DKA (diabetic ketoacidosis) ([place]) 03/31/2018  . S/P CABG x 3 01/14/2018  . CAD (coronary artery disease), native coronary artery 01/11/2018  . Dehydration   . MDD (major depressive disorder), single episode, severe , no psychosis ([place])   . DKA (diabetic ketoacidoses) ([place]) 06/16/2017  . Tardive dyskinesia   . DKA, type 1 ([place]) 03/18/2016  . Noncompliance with treatment plan 03/13/2016  . Essential hypertension 01/24/2016  . HLD (hyperlipidemia)   . GERD (gastroesophageal reflux disease)   . AKI (acute kidney injury) ([employer])   . Anemia, iron deficiency   . Vitamin B12 deficiency 08/16/2015  . Diabetic gastroparesis ([place])   . Diabetic neuropathy, type I diabetes mellitus ([place]) 05/18/2015  . Anxiety and depression 05/18/2015    Past Surgical History:  Procedure Laterality Date  . CARDIAC SURGERY    . COLONOSCOPY  09/27/2014   at [facility]  . CORONARY ARTERY BYPASS GRAFT N/A 01/14/2018   Procedure: CORONARY ARTERY BYPASS GRAFTING (CABG)X3, RIGHT AND LEFT SAPHENOUS VEIN HARVEST, MAMMARY TAKE DOWN. MAMMARY TO LAD, SVG TO PD, SVG TO DISTAL CIRC.;  Surgeon: Grace Isaac, MD;  Location: [facility] OR;  Service: Open Heart Surgery;  Laterality: N/A;  . ESOPHAGOGASTRODUODENOSCOPY  09/27/2014   at [facility], Dr Rolan Lipa. biospy neg for celiac, neg for H pylori.   . EYE SURGERY    . gailstones    . IR FLUORO GUIDE CV LINE RIGHT  02/01/2017  . IR FLUORO GUIDE CV LINE RIGHT  03/06/2017  . IR FLUORO GUIDE CV LINE RIGHT  03/25/2017  . IR GENERIC HISTORICAL  01/24/2016     IR FLUORO GUIDE CV LINE RIGHT 01/24/2016 Darrell K Allred, PA-C [facility]-INTERV RAD  . IR GENERIC HISTORICAL  01/24/2016   IR US GUIDE VASC ACCESS RIGHT 01/24/2016 Darrell K Allred, PA-C [facility]  . IR US GUIDE VASC ACCESS RIGHT  02/01/2017  . IR US GUIDE VASC ACCESS RIGHT  03/06/2017  . IR US GUIDE VASC ACCESS RIGHT  03/25/2017  . LEFT HEART CATH AND CORONARY ANGIOGRAPHY N/A 01/07/2018   Procedure: LEFT HEART CATH AND CORONARY ANGIOGRAPHY;  Surgeon: Troy Sine, MD;  Location: [facility] CV LAB;  Service: Cardiovascular;  Laterality: N/A;  . POSTERIOR VITRECTOMY AND MEMBRANE PEEL-LEFT EYE  09/28/2002  . POSTERIOR VITRECTOMY AND MEMBRANE PEEL-RIGHT EYE  03/16/2002  . RETINAL DETACHMENT SURGERY    . TEE WITHOUT CARDIOVERSION N/A 01/14/2018   Procedure: TRANSESOPHAGEAL ECHOCARDIOGRAM (TEE);  Surgeon: Grace Isaac, MD;  Location: [facility];  Service: Open Heart Surgery;  Laterality: N/A;     OB History   None      Home Medications    Prior to Admission medications   Medication Sig Start Date End Date Taking? Authorizing Provider  acetaminophen (TYLENOL) 325 MG tablet Take 2 tablets (650 mg total) by mouth every 6 (six) hours as needed for mild pain. 01/25/18   Elgie Collard, PA-C  aspirin EC 325 MG EC tablet Take 1 tablet (325 mg total) by mouth daily. 01/25/18   Elgie Collard, PA-C  atorvastatin (LIPITOR) 80 MG tablet Take 1 tablet (80 mg total) by mouth daily at 6 PM. 03/20/18   [name], MD  Blood Glucose Monitoring Suppl (ONETOUCH VERIO) w/Device KIT Use as directed 03/21/18   [name], MD  busPIRone (BUSPAR) 10 MG tablet Take 1 tablet (10 mg total) by mouth 3 (three) times daily. 03/20/18   [name], MD  diphenhydrAMINE (BENADRYL) 25 mg capsule Take 25 mg by mouth every 6 (six) hours as needed for allergies.    [provider]  docusate sodium (COLACE) 100 MG capsule Take 1 capsule (100 mg total) by mouth daily as needed for mild constipation. 02/03/18   Burtis Junes, NP  furosemide (LASIX) 40 MG tablet Take 1 tablet (40 mg total) by mouth daily. Patient taking differently: Take 40 mg by mouth daily as needed for fluid or edema.  03/20/18 03/15/19  [name], MD  gabapentin (NEURONTIN) 600 MG tablet Take 600 mg by mouth 2 (two) times daily.    [provider]  glucose blood test strip Use as instructed 03/21/18   [name], MD  glucose blood test strip Check sugar 4 times daily. 04/28/18   Elayne Snare, MD  insulin aspart (NOVOLOG) 100 UNIT/ML injection Inject 0-12 Units into the skin 4 (four) times daily -  before meals and at bedtime. Uses a sliding scale: 70-119 = 0 units, 120-150 = 2 units, 151-180 =  4 units, 181-210 = 5 units, 211-240 = 6 units, 241-270 = 8 units, 271-300 = 9 units, 301-330 = 10 units,  331-360 = 11 units, 361-400 = 12 units, greater than 400 notify MD.    [provider]  insulin detemir (LEVEMIR) 100 UNIT/ML injection Inject 0.15 mLs (15 Units total) into the skin every morning. 03/20/18   [name], MD  Insulin Syringe-Needle U-100 (BD INSULIN SYRINGE ULTRAFINE) 31G X 5/16" 0.5 ML MISC 1 each by Does not apply route 4 (four) times daily. 04/28/18   Elayne Snare, MD  Lancets [facility] DELICA PLUS DTOIZT24P) [place] 1 Units by Does not apply route as directed. 03/21/18   [name], MD  loperamide (IMODIUM) 2 MG capsule Take 1 capsule (2 mg total) by mouth as needed for diarrhea or loose stools. 04/15/18   Oretha Milch D, MD  metoprolol tartrate (LOPRESSOR) 25 MG tablet Take 0.5 tablets (12.5 mg total) by mouth 2 (two) times daily. 03/20/18   [name], MD  pantoprazole (PROTONIX) 40 MG tablet Take 1 tablet (40 mg total) by mouth daily. 03/20/18   [name], MD  potassium chloride (K-DUR) 10 MEQ tablet Take 1 tablet (10 mEq total) by mouth daily. Patient taking differently: Take 10 mEq by mouth daily as needed (low potassium).  03/20/18   [name], MD  promethazine (PHENERGAN) 12.5 MG  tablet Take 1 tablet (12.5 mg total) by mouth every 6 (six) hours as needed for nausea or vomiting. 03/20/18   [name], MD  pyridostigmine (MESTINON) 60 MG tablet Take 60 mg by mouth 3 (three) times daily.    [provider]    Family History Family History  Problem Relation Age of Onset  . Cystic fibrosis Mother   . Hypertension Father   . Diabetes Brother   . Hypertension Maternal Grandmother     Social History Social History   Tobacco Use  . Smoking status: Never Smoker  . Smokeless tobacco: Never Used  Substance Use Topics  . Alcohol use: No  . Drug use: No     Allergies   Anesthetics, amide; Penicillins; Buprenorphine hcl; Encainide; and Metoclopramide   Review of Systems Review of Systems  All other systems reviewed and are negative.    Physical Exam Updated Vital Signs BP (!) 136/50   Pulse (!) 107   Temp 97.9 F (36.6 C) (Oral)   Resp (!) 26   LMP 04/24/2018   SpO2 100%   Physical Exam  Constitutional: She appears well-developed.  HENT:  Head: Normocephalic and atraumatic.  Right Ear: External ear normal.  Left Ear: External ear normal.  Nose: Nose normal.  Mouth/Throat: Uvula is midline and oropharynx is clear and moist. Mucous membranes are dry. No tonsillar exudate.  Dry mucous membranes  Eyes: Pupils are equal, round, and reactive to light. Right eye exhibits no discharge. Left eye exhibits no discharge. No scleral icterus.  Neck: Trachea normal. Neck supple. No spinous process tenderness present. No neck rigidity. Normal range of motion present.  Cardiovascular: Normal rate, regular rhythm and intact distal pulses.  No murmur heard. Pulses:      Radial pulses are 2+ on the right side, and 2+ on the left side.       Dorsalis pedis pulses are 2+ on the right side, and 2+ on the left side.       Posterior tibial pulses are 2+ on the right side, and 2+ on the left side.  No lower extremity swelling or edema. Calves symmetric in  size bilaterally.  Pulmonary/Chest: Effort normal and breath sounds normal. She exhibits no tenderness.  Abdominal: Soft. Bowel sounds  are normal. She exhibits no distension. There is no rigidity, no rebound and no guarding.  Musculoskeletal: She exhibits no edema.  Lymphadenopathy:    She has no cervical adenopathy.  Neurological: She is alert.  Moves all extremities without difficulty.   Skin: Skin is warm and dry. No rash noted. She is not diaphoretic.  Psychiatric: She has a normal mood and affect.  Nursing note and vitals reviewed.    ED Treatments / Results  Labs (all labs ordered are listed, but only abnormal results are displayed) Labs Reviewed  I-STAT CHEM 8, ED - Abnormal; Notable for the following components:      Result Value   BUN 32 (*)    Creatinine, Ser 1.60 (*)    Glucose, Bld 648 (*)    Calcium, Ion 1.09 (*)    TCO2 15 (*)    Hemoglobin 15.6 (*)    All other components within normal limits  I-STAT VENOUS BLOOD GAS, ED - Abnormal; Notable for the following components:   pH, Ven 7.178 (*)    pCO2, Ven 34.4 (*)    pO2, Ven 58.0 (*)    Bicarbonate 12.8 (*)    TCO2 14 (*)    Acid-base deficit 15.0 (*)    All other components within normal limits  CBG MONITORING, ED - Abnormal; Notable for the following components:   Glucose-Capillary 549 (*)    All other components within normal limits  URINE CULTURE  COMPREHENSIVE METABOLIC PANEL  LIPASE, BLOOD  CBC WITH DIFFERENTIAL/PLATELET  URINALYSIS, ROUTINE W REFLEX MICROSCOPIC  I-STAT BETA HCG BLOOD, ED ([facility], [facility], [facility] ONLY)    EKG EKG Interpretation  Date/Time:  Wednesday April 30 2018 10:28:03 EST Ventricular Rate:  110 PR Interval:    QRS Duration: 101 QT Interval:  355 QTC Calculation: 481 R Axis:   -50 Text Interpretation:  Sinus tachycardia LAD, consider left anterior fascicular block Anterior infarct, old No STEMI.  Confirmed by Nanda Quinton [phone]) on 04/30/2018 10:32:22 AM   Radiology No results  found.  Procedures Procedures (including critical care time) CRITICAL CARE Performed by: Jillyn Ledger   Total critical care time: 60 minutes - DKA  Critical care time was exclusive of separately billable procedures and treating other patients.  Critical care was necessary to treat or prevent imminent or life-threatening deterioration.  Critical care was time spent personally by me on the following activities: development of treatment plan with patient and/or surrogate as well as nursing, discussions with consultants, evaluation of patient's response to treatment, examination of patient, obtaining history from patient or surrogate, ordering and performing treatments and interventions, ordering and review of laboratory studies, ordering and review of radiographic studies, pulse oximetry and re-evaluation of patient's condition.   Medications Ordered in ED Medications  0.9 %  sodium chloride infusion (has no administration in time range)  insulin regular, human (MYXREDLIN) 100 units/ 100 mL infusion (has no administration in time range)  dextrose 5 %-0.45 % sodium chloride infusion (has no administration in time range)  promethazine (PHENERGAN) injection 12.5 mg (has no administration in time range)  ondansetron (ZOFRAN) injection 4 mg (4 mg Intravenous Given 04/30/18 1050)  sodium chloride 0.9 % bolus 1,000 mL (1,000 mLs Intravenous New Bag/Given 04/30/18 1051)  diphenhydrAMINE (BENADRYL) injection 25 mg (25 mg Intravenous Given 04/30/18 1213)     Initial Impression / Assessment and Plan / ED Course  I have reviewed the triage vital signs and the nursing notes.  Pertinent labs & imaging results that  were available during my care of the patient were reviewed by me and considered in my medical decision making (see chart for details).     52 y.o. female with a past medical history significant for insulin-dependent type 1 diabetes, gastroparesis presenting for 1 day of generalized  abdominal pain, hyperglycemia, nausea and vomiting.  Patient has been compliant with home medication. No infectious symptoms reported. Exam as above. No peritoneal signs on abdominal exam. Vital signs with mild tachycardia. No hypotension or fever. CBG >500. Labs ordered. No indication for imaging at this time.   10:40 AM Patient started on 1L fluids + maintenance fluids and given zofran for N/V while awaiting lab work.  10:53 AM I-stat chem 8 with K of 5.1, glucose 648. VBG with Ph of 7.178. Suspect patient's symptoms 2/2 DKA. DKA order set used. Patient started on glucose stabilizer. No potassium supplementation at this time warranted.   12:11 PM IV team unable to get 2nd iv. 2nd IV team to consult.   1:15 PM EJ in place by Dr. Laverta Baltimore. Fluids and Insulin running. Lab work has return and remains consistent with DKA. No indication for K replacement. Will admit. N/V currently controlled.   1:42 PM  [facility] hospitalist has agreed to admit the patient. I appreciate this. Purwick is place, UA is not yet obtained. Patient is stable for admission.   Patient case discussed with Dr. Laverta Baltimore who is in agreement with plan.  Final Clinical Impressions(s) / ED Diagnoses   Final diagnoses:  Diabetic ketoacidosis without coma associated with type 1 diabetes mellitus [facility])    ED Discharge Orders    None       Jillyn Ledger, Hershal Coria 04/30/18 1344    Margette Fast, MD 04/30/18 [phone]

## 2018-04-30 NOTE — ED Notes (Signed)
Hijazi, MD verbal order to not give IV insulin at this time d/t pt not receiving fluids, once pt has received 1 Liter NS IV insulin can be started

## 2018-04-30 NOTE — ED Notes (Signed)
Phlebotomist at bedside unable to obtain labs, second Phlebotomists to attempt lab draw

## 2018-04-30 NOTE — Progress Notes (Signed)
Spoke with patient RN re: PICC placement.  This PICC RN notified patient RN that IV team does not place this patient's PICC line due to heavy scarring of her vessels.  Informed RN that patient usually gets her PICC lines placed from IR.  Patient RN notified PICC RN that a CVC triple lumen was just placed in patient.

## 2018-04-30 NOTE — ED Notes (Signed)
NS infusion paused at this time d/t redness at site, PA at bedside d/t pt having continued nausea and anxiety, meds ordered and will be given when 2nd IV access established

## 2018-04-30 NOTE — ED Notes (Signed)
Pt has redness to L hand, IV fluids paused and PA notified

## 2018-04-30 NOTE — ED Notes (Signed)
5W room assigned at this time, attempted report

## 2018-04-30 NOTE — ED Provider Notes (Addendum)
Emergency Ultrasound Study:   Angiocath insertion Performed by: Margette Fast  Consent: Verbal consent obtained. Risks and benefits: risks, benefits and alternatives were discussed Immediately prior to procedure the correct patient, procedure, equipment, support staff and site/side marked as needed.  Indication: difficult IV access Preparation: Patient was prepped and draped in the usual sterile fashion. Vein Location: Right EJ vein was visualized during assessment for potential access sites and was found to be patent/ easily compressed with linear ultrasound.  The needle was visualized with real-time ultrasound and guided into the vein. Gauge: 20  Normal blood return.  Patient tolerance: Patient tolerated the procedure well with no immediate complications.   3:45 PM EJ line infiltrated.  This line was removed.  The swelling is soft and not affecting the patient's airway.  Mental status is unchanged.  I evaluated the patient's candidacy for IJ central line.  She has significant scar tissue in these areas and anatomy that would make IJ placement very difficult.  I was able to visualize the femoral vein easily on the left.  Left femoral central line was placed without complication and treatment continued.   [name] Line Date/Time: 04/30/2018 3:47 PM Performed by: Margette Fast, MD Authorized by: Margette Fast, MD   Consent:    Consent obtained:  Written   Consent given by:  Patient   Risks discussed:  Arterial puncture, bleeding, infection, incorrect placement and nerve damage Pre-procedure details:    Hand hygiene: Hand hygiene performed prior to insertion     Sterile barrier technique: All elements of maximal sterile technique followed     Skin preparation:  ChloraPrep   Skin preparation agent: Skin preparation agent completely dried prior to procedure   Sedation:    Sedation type:  Anxiolysis Anesthesia (see MAR for exact dosages):    Anesthesia method:  Local infiltration  Local anesthetic:  Lidocaine 1% w/o epi Procedure details:    Location:  L femoral   Site selection rationale:  No other acceptable site.    Patient position:  Flat   Procedural supplies:  Triple lumen   Landmarks identified: yes     Ultrasound guidance: yes     Sterile ultrasound techniques: Sterile gel and sterile probe covers were used     Number of attempts:  2   Successful placement: yes   Post-procedure details:    Post-procedure:  Dressing applied and line sutured   Assessment:  Blood return through all ports and free fluid flow   Patient tolerance of procedure:  Tolerated well, no immediate complications .Critical Care Performed by: Margette Fast, MD Authorized by: Margette Fast, MD   Critical care provider statement:    Critical care time (minutes):  75   Critical care time was exclusive of:  Separately billable procedures and treating other patients and teaching time   Critical care was necessary to treat or prevent imminent or life-threatening deterioration of the following conditions:  Endocrine crisis and dehydration   Critical care was time spent personally by me on the following activities:  Blood draw for specimens, development of treatment plan with patient or surrogate, evaluation of patient's response to treatment, examination of patient, obtaining history from patient or surrogate, ordering and performing treatments and interventions, ordering and review of laboratory studies, ordering and review of radiographic studies, pulse oximetry, re-evaluation of patient's condition, review of old charts and vascular access procedures   I assumed direction of critical care for this patient from another provider in my specialty:  no       Margette Fast, MD 04/30/18 1332    Margette Fast, MD 04/30/18 [phone]

## 2018-04-30 NOTE — ED Notes (Signed)
vp x2 attempts for labs rn wendy c notified

## 2018-04-30 NOTE — ED Notes (Signed)
IV team unable to gain IV access 

## 2018-04-30 NOTE — ED Notes (Signed)
MD unable to continue with central line placement earlier, resuming attempt at this time for placement

## 2018-04-30 NOTE — ED Notes (Signed)
Second IV team RN not present to assist, this RN spoke with Long, MD who agrees to attempt EJ placement, Long, MD attempted EJ access on R EJ unsuccessful, attempting EJ placement again at this time

## 2018-04-30 NOTE — ED Notes (Signed)
R EJ has swelling around site with concern for infiltration, fluids stopped and MD notified

## 2018-04-30 NOTE — ED Notes (Signed)
Hijazi, MD aware of no IV access on pt, order placed for PICC line insertion, Long, MD notified and in agreement to attempt central line placement

## 2018-04-30 NOTE — ED Notes (Signed)
Dr. Laverta Baltimore gave verbal permission to use central line

## 2018-04-30 NOTE — ED Notes (Signed)
This RN discussed plan to push IV Benadryl while waiting for second IV team RN to gain second access, # 24 L hand flushed without difficulty with redness to L hand after IV push Benadryl, this RN called pharmacist and inquired if there was any special instructions if the med was outside of the vein in the hand, per pharmacist there is no special instructions other than cold compress, cold compress applied, pt denies pain, will continue to monitor, second RN assessing site without difficulty flushing, plan to remove #24 once second IV established, PA aware that IV insulin has not been started d/t IV access difficulty

## 2018-05-01 ENCOUNTER — Inpatient Hospital Stay: Payer: Self-pay

## 2018-05-01 DIAGNOSIS — I10 Essential (primary) hypertension: Secondary | ICD-10-CM

## 2018-05-01 DIAGNOSIS — N179 Acute kidney failure, unspecified: Secondary | ICD-10-CM

## 2018-05-01 DIAGNOSIS — K3184 Gastroparesis: Secondary | ICD-10-CM

## 2018-05-01 DIAGNOSIS — E1143 Type 2 diabetes mellitus with diabetic autonomic (poly)neuropathy: Secondary | ICD-10-CM

## 2018-05-01 DIAGNOSIS — E1010 Type 1 diabetes mellitus with ketoacidosis without coma: Secondary | ICD-10-CM

## 2018-05-01 LAB — GLUCOSE, CAPILLARY
GLUCOSE-CAPILLARY: 133 mg/dL — AB (ref 70–99)
GLUCOSE-CAPILLARY: 215 mg/dL — AB (ref 70–99)
GLUCOSE-CAPILLARY: 217 mg/dL — AB (ref 70–99)
GLUCOSE-CAPILLARY: 275 mg/dL — AB (ref 70–99)
GLUCOSE-CAPILLARY: 72 mg/dL (ref 70–99)
GLUCOSE-CAPILLARY: 81 mg/dL (ref 70–99)
GLUCOSE-CAPILLARY: 86 mg/dL (ref 70–99)
Glucose-Capillary: 103 mg/dL — ABNORMAL HIGH (ref 70–99)
Glucose-Capillary: 162 mg/dL — ABNORMAL HIGH (ref 70–99)
Glucose-Capillary: 198 mg/dL — ABNORMAL HIGH (ref 70–99)
Glucose-Capillary: 297 mg/dL — ABNORMAL HIGH (ref 70–99)
Glucose-Capillary: 442 mg/dL — ABNORMAL HIGH (ref 70–99)
Glucose-Capillary: 75 mg/dL (ref 70–99)

## 2018-05-01 LAB — BASIC METABOLIC PANEL
ANION GAP: 7 (ref 5–15)
ANION GAP: 7 (ref 5–15)
ANION GAP: 9 (ref 5–15)
BUN: 43 mg/dL — ABNORMAL HIGH (ref 6–20)
BUN: 39 mg/dL — ABNORMAL HIGH (ref 6–20)
BUN: 40 mg/dL — ABNORMAL HIGH (ref 6–20)
CALCIUM: 7.8 mg/dL — AB (ref 8.9–10.3)
CALCIUM: 8.1 mg/dL — AB (ref 8.9–10.3)
CALCIUM: 8.1 mg/dL — AB (ref 8.9–10.3)
CO2: 18 mmol/L — AB (ref 22–32)
CO2: 18 mmol/L — AB (ref 22–32)
CO2: 19 mmol/L — AB (ref 22–32)
Chloride: 114 mmol/L — ABNORMAL HIGH (ref 98–111)
Chloride: 118 mmol/L — ABNORMAL HIGH (ref 98–111)
Chloride: 117 mmol/L — ABNORMAL HIGH (ref 98–111)
Creatinine, Ser: 2.31 mg/dL — ABNORMAL HIGH (ref 0.44–1.00)
Creatinine, Ser: 2.06 mg/dL — ABNORMAL HIGH (ref 0.44–1.00)
Creatinine, Ser: 1.63 mg/dL — ABNORMAL HIGH (ref 0.44–1.00)
GFR calc non Af Amer: 23 mL/min — ABNORMAL LOW (ref >60)
GFR, EST AFRICAN AMERICAN: 27 mL/min — AB (ref >60)
GFR, EST AFRICAN AMERICAN: 31 mL/min — AB (ref >60)
GFR, EST AFRICAN AMERICAN: 41 mL/min — AB (ref >60)
GFR, EST NON AFRICAN AMERICAN: 27 mL/min — AB (ref >60)
GFR, EST NON AFRICAN AMERICAN: 36 mL/min — AB (ref >60)
Glucose, Bld: 292 mg/dL — ABNORMAL HIGH (ref 70–99)
Glucose, Bld: 104 mg/dL — ABNORMAL HIGH (ref 70–99)
Glucose, Bld: 106 mg/dL — ABNORMAL HIGH (ref 70–99)
POTASSIUM: 3.4 mmol/L — AB (ref 3.5–5.1)
Potassium: 3.3 mmol/L — ABNORMAL LOW (ref 3.5–5.1)
Potassium: 3.9 mmol/L (ref 3.5–5.1)
Sodium: 141 mmol/L (ref 135–145)
Sodium: 144 mmol/L (ref 135–145)
Sodium: 142 mmol/L (ref 135–145)

## 2018-05-01 LAB — COMPREHENSIVE METABOLIC PANEL
ALT: 18 U/L (ref 0–44)
ANION GAP: 8 (ref 5–15)
AST: 20 U/L (ref 15–41)
Albumin: 3 g/dL — ABNORMAL LOW (ref 3.5–5.0)
Alkaline Phosphatase: 70 U/L (ref 38–126)
BILIRUBIN TOTAL: 0.7 mg/dL (ref 0.3–1.2)
BUN: 37 mg/dL — AB (ref 6–20)
CHLORIDE: 114 mmol/L — AB (ref 98–111)
CO2: 16 mmol/L — ABNORMAL LOW (ref 22–32)
Calcium: 8 mg/dL — ABNORMAL LOW (ref 8.9–10.3)
Creatinine, Ser: 2 mg/dL — ABNORMAL HIGH (ref 0.44–1.00)
GFR, EST AFRICAN AMERICAN: 32 mL/min — AB (ref >60)
GFR, EST NON AFRICAN AMERICAN: 28 mL/min — AB (ref >60)
Glucose, Bld: 259 mg/dL — ABNORMAL HIGH (ref 70–99)
POTASSIUM: 4.2 mmol/L (ref 3.5–5.1)
Sodium: 138 mmol/L (ref 135–145)
TOTAL PROTEIN: 5.9 g/dL — AB (ref 6.5–8.1)

## 2018-05-01 LAB — MRSA PCR SCREENING: MRSA by PCR: NEGATIVE

## 2018-05-01 MED ORDER — INSULIN ASPART 100 UNIT/ML ~~LOC~~ SOLN
0.0000 [IU] | Freq: Three times a day (TID) | SUBCUTANEOUS | Status: DC
  Administered 2018-05-01: 3 [IU] via SUBCUTANEOUS

## 2018-05-01 MED ORDER — SODIUM CHLORIDE 0.9 % IV SOLN
INTRAVENOUS | Status: DC
  Administered 2018-05-01 (×2): via INTRAVENOUS

## 2018-05-01 MED ORDER — INSULIN ASPART 100 UNIT/ML ~~LOC~~ SOLN: 0.0000 [IU] | Freq: Three times a day (TID) | SUBCUTANEOUS | Status: DC

## 2018-05-01 MED ORDER — INSULIN ASPART 100 UNIT/ML ~~LOC~~ SOLN: 0.0000 [IU] | Freq: Every day | SUBCUTANEOUS | Status: DC

## 2018-05-01 MED ORDER — PROMETHAZINE HCL 25 MG PO TABS: 12.5000 mg | ORAL_TABLET | Freq: Four times a day (QID) | ORAL | Status: DC | PRN

## 2018-05-01 MED ORDER — PROMETHAZINE HCL 6.25 MG/5ML PO SYRP
6.2500 mg | ORAL_SOLUTION | Freq: Four times a day (QID) | ORAL | Status: DC | PRN
  Filled 2018-05-01: qty 5

## 2018-05-01 MED ORDER — INSULIN DETEMIR 100 UNIT/ML ~~LOC~~ SOLN
10.0000 [IU] | Freq: Every day | SUBCUTANEOUS | Status: DC
  Administered 2018-05-01 – 2018-05-02 (×2): 10 [IU] via SUBCUTANEOUS
  Filled 2018-05-01 (×2): qty 0.1

## 2018-05-01 MED ORDER — SODIUM CHLORIDE 0.9% FLUSH
10.0000 mL | INTRAVENOUS | Status: DC | PRN
  Administered 2018-05-01: 20 mL
  Filled 2018-05-01: qty 40

## 2018-05-01 MED ORDER — MIDODRINE HCL 5 MG PO TABS
5.0000 mg | ORAL_TABLET | Freq: Two times a day (BID) | ORAL | Status: DC
  Administered 2018-05-01 – 2018-05-02 (×2): 5 mg via ORAL
  Filled 2018-05-01 (×2): qty 1

## 2018-05-01 MED ORDER — POTASSIUM CHLORIDE CRYS ER 20 MEQ PO TBCR
40.0000 meq | EXTENDED_RELEASE_TABLET | Freq: Once | ORAL | Status: AC
  Administered 2018-05-01: 40 meq via ORAL
  Filled 2018-05-01: qty 2

## 2018-05-01 MED ORDER — INSULIN ASPART 100 UNIT/ML ~~LOC~~ SOLN
0.0000 [IU] | Freq: Three times a day (TID) | SUBCUTANEOUS | Status: DC
  Administered 2018-05-01: 15 [IU] via SUBCUTANEOUS
  Administered 2018-05-02: 5 [IU] via SUBCUTANEOUS
  Administered 2018-05-02: 11 [IU] via SUBCUTANEOUS

## 2018-05-01 MED ORDER — ONDANSETRON HCL 4 MG/2ML IJ SOLN
4.0000 mg | Freq: Three times a day (TID) | INTRAMUSCULAR | Status: DC
  Administered 2018-05-01 – 2018-05-02 (×3): 4 mg via INTRAVENOUS
  Filled 2018-05-01 (×3): qty 2

## 2018-05-01 MED ORDER — SODIUM CHLORIDE 0.9 % IV BOLUS
500.0000 mL | Freq: Once | INTRAVENOUS | Status: AC
  Administered 2018-05-01: 500 mL via INTRAVENOUS

## 2018-05-01 MED ORDER — ENOXAPARIN SODIUM 30 MG/0.3ML ~~LOC~~ SOLN
30.0000 mg | SUBCUTANEOUS | Status: DC
  Administered 2018-05-01: 30 mg via SUBCUTANEOUS
  Filled 2018-05-01: qty 0.3

## 2018-05-01 MED ORDER — NALOXONE HCL 0.4 MG/ML IJ SOLN
0.4000 mg | INTRAMUSCULAR | Status: DC | PRN
  Administered 2018-05-01: 0.4 mg via INTRAVENOUS
  Filled 2018-05-01: qty 1

## 2018-05-01 MED ORDER — PROMETHAZINE HCL 12.5 MG PO TABS
6.2500 mg | ORAL_TABLET | Freq: Four times a day (QID) | ORAL | Status: DC | PRN
  Filled 2018-05-01: qty 1

## 2018-05-01 NOTE — Progress Notes (Signed)
   Patient Status: [facility] - In-pt  Assessment and Plan: Patient in need of venous access.    Peripherally inserted central catheter placement ______________________________________________________________________   History of Present Illness: [name] is a 52 y.o. female   Gastroparesis To ED yesterday --- ++ N/ ++ Vomiting Temporary emergent left femoral vein catheter was placed in ED yesterday (intact) Failed attempts at IJ per notes Difficult access Need to replace with PICC per MD access and medications Plan for DC in next few days  Plan for removal of TL Lt femoral venous access after PICC placed  Allergies and medications reviewed.   Review of Systems: A 12 point ROS discussed and pertinent positives are indicated in the HPI above.  All other systems are negative.   Vital Signs: BP (!) 89/51   Pulse 90   Temp 98.1 F (36.7 C) (Oral)   Resp 19   Ht 5\' 1"  (1.549 m)   Wt 127 lb 6.8 oz (57.8 kg)   LMP 04/24/2018   SpO2 100%   BMI 24.08 kg/m   Physical Exam  Musculoskeletal: Normal range of motion.  Neurological: She is alert.  Skin: Skin is warm and dry.  Left femoral vein IV access  Psychiatric: She has a normal mood and affect. Her behavior is normal. Judgment and thought content normal.  Vitals reviewed.    Imaging reviewed.   Labs:  COAGS: Recent Labs    01/04/18 1825 01/13/18 2304 01/14/18 1433  INR 1.27 0.98 1.33  APTT 56* 122* 35    BMP: Recent Labs    05/01/18 0032 05/01/18 0449 05/01/18 0742 05/01/18 1040  NA 141 144 142 138  K 3.3* 3.4* 3.9 4.2  CL 114* 118* 117* 114*  CO2 18* 19* 18* 16*  GLUCOSE 292* 104* 106* 259*  BUN 43* 39* 40* 37*  CALCIUM 7.8* 8.1* 8.1* 8.0*  CREATININE 2.31* 2.06* 1.63* 2.00*  GFRNONAA 23* 27* 36* 28*  GFRAA 27* 31* 41* 32*    Scheduled for Peripherally inserted central catheter placement in IR Pt is aware of procedure benefits and risks including but not limited to Infection; bleeding;  vessel damage Agreeable to proceed Consent signed and in chart   Electronically Signed: Ayelet Gruenewald A, PA-C 05/01/2018, 1:22 PM   I spent a total of 15 minutes in face to face in clinical consultation, greater than 50% of which was counseling/coordinating care for venous access.Patient ID: [name], female   DOB: Nov 18, 1965, 52 y.o.   MRN: [id number]

## 2018-05-01 NOTE — Progress Notes (Signed)
Patient sleeping, very drowsy, responds to loud stimuli. BP 88/51 after multiple checks. BG 275. Ronalee Belts, RN aware. Schorr, NP notified via text page.

## 2018-05-01 NOTE — Progress Notes (Signed)
PROGRESS NOTE        PATIENT DETAILS Name: [name] Age: 52 y.o. Sex: female Date of Birth: 1965-10-02 Admit Date: 04/30/2018 Admitting Physician Merton Border, MD DTO:[email or web address], [name], MD  Brief Narrative: Patient is a 52 y.o. female insulin-dependent type 2 diabetes, gastroparesis, anxiety/depression, hypertension, CAD status post CABG presented to the hospital with intractable vomiting found to have diabetic ketoacidosis-started on IV fluids, insulin infusion and admitted to the hospitalist service.  See below for further details  Subjective: Looks very frail and weak-nauseous but no vomiting.  Off insulin infusion since early this morning.  Assessment/Plan: DKA: Resolved-transitioned off insulin infusion to subcutaneous insulin this morning.  DM-2 with hyperglycemia: CBGs are currently stable but just came off the insulin drip.  A1c 8.1.  Continue Lantus 10 units daily and SSI.  Will follow and adjust accordingly.   Gastroparesis flare: Nauseous morning-was vomiting earlier this week.  Suspect gastroparesis triggered DKA as patient decreased her insulin dosage.  Is unfortunately allergic to Reglan-start scheduled IV Zofran and as needed IV Phenergan.  Counseled regarding importance of small portion meals.  Acute kidney injury: Hemodynamically mediated-likely secondary to DKA/vomiting.  Improving with supportive care.  Avoid nephrotoxic agents.  Hypertension: BP soft-continue to hold all antihypertensives for now.  CAD status post CABG July 2019: No anginal symptoms.  Continue aspirin.  Dyslipidemia: Continue statin  GERD: Continue PPI  Generalized weakness/deconditioning: Suspect secondary to acute illness-may have some amount of debility at baseline-PT eval ordered.  Other issues-very hard stick-has left femoral triple-lumen catheter-plans are to discontinue femoral catheter after PICC line placed  DVT Prophylaxis: Prophylactic Lovenox   Code  Status: Full code   Family Communication: None at bedside  Disposition Plan: Remain inpatient-home in a few days if clinical improvement continues  Antimicrobial agents: Anti-infectives (From admission, onward)   None      Procedures: None  CONSULTS:  None  Time spent: 25- minutes-Greater than 50% of this time was spent in counseling, explanation of diagnosis, planning of further management, and coordination of care.  MEDICATIONS: Scheduled Meds: . aspirin  325 mg Oral Daily  . atorvastatin  80 mg Oral q1800  . busPIRone  10 mg Oral TID  . enoxaparin (LOVENOX) injection  30 mg Subcutaneous Q24H  . insulin aspart  0-5 Units Subcutaneous QHS  . insulin aspart  0-9 Units Subcutaneous TID WC  . insulin detemir  10 Units Subcutaneous Daily  . metoprolol tartrate  12.5 mg Oral BID  . ondansetron (ZOFRAN) IV  4 mg Intravenous TID AC  . pantoprazole  40 mg Oral Daily  . pregabalin  150 mg Oral BID   Continuous Infusions: PRN Meds:.acetaminophen, diphenhydrAMINE, naLOXone (NARCAN)  injection, promethazine, sodium chloride flush   PHYSICAL EXAM: Vital signs: Vitals:   05/01/18 0620 05/01/18 0650 05/01/18 0800 05/01/18 1003  BP: (!) 91/52 109/61 (!) 98/58   Pulse: 85 90 89   Resp: 13 14 13    Temp:   97.9 F (36.6 C) 98.9 F (37.2 C)  TempSrc:   Oral Oral  SpO2: 100% 100% 100%   Weight:      Height:       Filed Weights   04/30/18 2100  Weight: 57.8 kg   Body mass index is 24.08 kg/m.   General appearance :Awake, alert, not in any distress. Speech Clear.  HEENT: Atraumatic and Normocephalic Neck:  supple Resp:Good air entry bilaterally, no added sounds  CVS: S1 S2 regular, no murmurs.  GI: Bowel sounds present, Non tender and not distended with no gaurding, rigidity or rebound. Extremities: B/L Lower Ext shows no edema, both legs are warm to touch Neurology:  speech clear,Non focal, sensation is grossly intact. Musculoskeletal:No digital cyanosis Skin:No  Rash, warm and dry Wounds:N/A  I have personally reviewed following labs and imaging studies  LABORATORY DATA: CBC: Recent Labs  Lab 04/30/18 1017 04/30/18 1039  WBC 18.2*  --   NEUTROABS 17.1*  --   HGB 12.7 15.6*  HCT 42.4 46.0  MCV 93.8  --   PLT 387  --     Basic Metabolic Panel: Recent Labs  Lab 04/30/18 1017 04/30/18 1039 04/30/18 2146 05/01/18 0032 05/01/18 0449 05/01/18 0742  NA 136 136 139 141 144 142  K 5.7* 5.1 3.6 3.3* 3.4* 3.9  CL 101 106 109 114* 118* 117*  CO2 8*  --  14* 18* 19* 18*  GLUCOSE 737* 648* 454* 292* 104* 106*  BUN 36* 32* 44* 43* 39* 40*  CREATININE 2.22* 1.60* 2.57* 2.31* 2.06* 1.63*  CALCIUM 9.4  --  8.2* 7.8* 8.1* 8.1*    GFR: Estimated Creatinine Clearance: 33.8 mL/min (A) (by C-G formula based on SCr of 1.63 mg/dL (H)).  Liver Function Tests: Recent Labs  Lab 04/30/18 1017  AST 28  ALT 27  ALKPHOS 104  BILITOT 2.7*  PROT 7.5  ALBUMIN 4.0   Recent Labs  Lab 04/30/18 1017  LIPASE 21   No results for input(s): AMMONIA in the last 168 hours.  Coagulation Profile: No results for input(s): INR, PROTIME in the last 168 hours.  Cardiac Enzymes: No results for input(s): CKTOTAL, CKMB, CKMBINDEX, TROPONINI in the last 168 hours.  BNP (last 3 results) No results for input(s): PROBNP in the last 8760 hours.  HbA1C: Recent Labs    04/28/18 1415  HGBA1C 8.1*    CBG: Recent Labs  Lab 05/01/18 0503 05/01/18 0619 05/01/18 0658 05/01/18 0800 05/01/18 0855  GLUCAP 103* 81 72 86 75    Lipid Profile: No results for input(s): CHOL, HDL, LDLCALC, TRIG, CHOLHDL, LDLDIRECT in the last 72 hours.  Thyroid Function Tests: No results for input(s): TSH, T4TOTAL, FREET4, T3FREE, THYROIDAB in the last 72 hours.  Anemia Panel: No results for input(s): VITAMINB12, FOLATE, FERRITIN, TIBC, IRON, RETICCTPCT in the last 72 hours.  Urine analysis:    Component Value Date/Time   COLORURINE YELLOW 04/14/2018 0008    APPEARANCEUR HAZY (A) 04/14/2018 0008   LABSPEC 1.024 04/14/2018 0008   PHURINE 5.0 04/14/2018 0008   GLUCOSEU >=500 (A) 04/14/2018 0008   HGBUR MODERATE (A) 04/14/2018 0008   BILIRUBINUR NEGATIVE 04/14/2018 0008   BILIRUBINUR neg 01/17/2017 1533   KETONESUR 20 (A) 04/14/2018 0008   PROTEINUR 100 (A) 04/14/2018 0008   UROBILINOGEN 0.2 01/17/2017 1533   UROBILINOGEN 0.2 04/27/2015 1608   NITRITE NEGATIVE 04/14/2018 0008   LEUKOCYTESUR NEGATIVE 04/14/2018 0008    Sepsis Labs: Lactic Acid, Venous    Component Value Date/Time   LATICACIDVEN 1.3 04/13/2018 1800    MICROBIOLOGY: Recent Results (from the past 240 hour(s))  MRSA PCR Screening     Status: None   Collection Time: 04/30/18  9:11 PM  Result Value Ref Range Status   MRSA by PCR NEGATIVE NEGATIVE Final    Comment:        The GeneXpert MRSA Assay (FDA approved for NASAL specimens only), is  one component of a comprehensive MRSA colonization surveillance program. It is not intended to diagnose MRSA infection nor to guide or monitor treatment for MRSA infections. Performed at [facility], [place] [address], [name], [place] [address]     RADIOLOGY STUDIES/RESULTS: [place] Ekg Site Rite  Result Date: 05/01/2018 If Site Rite image not attached, placement could not be confirmed due to current cardiac rhythm.  [place] Ekg Site Rite  Result Date: 04/30/2018 If Site Rite image not attached, placement could not be confirmed due to current cardiac rhythm.    LOS: 1 day   Oren Binet, MD  [facility]  If 7PM-7AM, please contact night-coverage  Please page via [email or web address]-Password [email or web address]-click on MD name and type text message  05/01/2018, 11:52 AM

## 2018-05-01 NOTE — Progress Notes (Signed)
At [place], patient was able to keep eyes open slightly longer. Patient stated her name, stated she was light-headed, and asked for ice chips. Patient fell back asleep before getting ice chips. Patient has been sleeping. Patient back to being more somnolent at this time, only opening eyes for a second of two. Will continue to monitor.

## 2018-05-01 NOTE — Progress Notes (Signed)
PT Cancellation Note  Patient Details Name: [name] MRN: [id number] DOB: 08/20/1965   Cancelled Treatment:    Reason Eval/Treat Not Completed: Other (comment) Pt with L femoral central line. Per RN, unsure if pt will be getting PICC line placed. Will wait for MD clearance for mobility with femoral line or will wait until femoral line removed until initiation of mobility.   Leighton Ruff, PT, DPT  Acute Rehabilitation Services  Pager: [phone] Office: [phone]  Rudean Hitt 05/01/2018, 3:29 PM

## 2018-05-01 NOTE — Progress Notes (Signed)
Patient responds easily at this time, able to state name and birth date, and denie pain. Patient quickly falls back to sleep. Will continue to monitor. Ronalee Belts, RN paging Schorr, NP regarding Insulin drip with recent labs and BG of 81.

## 2018-05-01 NOTE — Progress Notes (Signed)
Initial Nutrition Assessment  DOCUMENTATION CODES:   Not applicable  INTERVENTION:  -Glucerna Shake po BID between meals, each supplement provides 220 kcal and 10 grams of protein  -MVI daily  -Provided education regarding carbohydrate counting for Type 1 diabetes  -Provided education on eating with gastroparesis    NUTRITION DIAGNOSIS:   Inadequate oral intake related to altered GI function(gastroparesis) as evidenced by per patient/family report.  GOAL:   Patient will meet greater than or equal to 90% of their needs  MONITOR:   PO intake, Supplement acceptance, Labs  REASON FOR ASSESSMENT:   Malnutrition Screening Tool    ASSESSMENT:  Ms. Pacifico is a 52 yo female with PMH of type 1 DM, diabetic gastroparesis, CAD s/p CABG 12/2017, CHF, HTN, anxdiety and depression admitted for diabetic ketoacidosis (DKA).   Visited pt in room. Pt says she doesn't count carbohydrates because she doesn't know how. Provided verbal education on carbohydrate counting for type 1 diabetes; to follow-up prior to discharge with handouts.   Pt regularly sees PCP for DM; recommended pt follow up with outpatient RD or diabetes coordinator regarding carb counting and insulin dosing, considering repeated admissions for DKA. Pt has received inpatient educations during previous admissions but unfortunately has hx of noncompliance.   Diet recall:  Breakfast: eggs, [place] sausage, toast, coffee with cream, 1/2 banana  Lunch: ritz or graham crackers with peanut butter or turkey/cheese sandwich with [employer]: varies; chicken, fish or shrimp with rice and green beans Beverages: drinks water, unsweet tea, coffee   Pt says she avoids red meat and fried food due to CAD post CABG and gastroparesis. She says that she has good appetite but gastroparesis limits the amount of food she can consume at one time. Provided brief education on eating with gastroparesis (limiting fats, consuming smaller frequent  meals).  Pt says usual body wt is 135 lbs.  Per chart pt with 7% wt loss since 01/2018. Not significant for the time frame however pt at risk for malnutrition in the future if chronic illness remains uncontrolled.  Pt ate 85% of breakfast this morning, no other meal completion available. Pt reports she couldn't eat it all due to gastroparesis. Will provide Glucerna BID between meals to encourage frequency of intake during hospitalization.   Labs: CBGs 72-133 (>600 on admission) Meds: insulin drip, Protonix, levemir   NUTRITION - FOCUSED PHYSICAL EXAM:    Most Recent Value  Orbital Region  No depletion  Upper Arm Region  No depletion  Thoracic and Lumbar Region  No depletion  Buccal Region  No depletion  Temple Region  No depletion  Clavicle Bone Region  No depletion  Clavicle and Acromion Bone Region  No depletion  Scapular Bone Region  No depletion  Dorsal Hand  No depletion  Patellar Region  No depletion  Anterior Thigh Region  No depletion  Posterior Calf Region  No depletion  Edema (RD Assessment)  None  Hair  Reviewed  Eyes  Reviewed  Mouth  Reviewed  Skin  Reviewed  Nails  Reviewed       Diet Order:   Diet Order            Diet Carb Modified Fluid consistency: Thin; Room service appropriate? Yes  Diet effective now              EDUCATION NEEDS:   Education needs have been addressed  Skin:  Skin Assessment: Reviewed RN Assessment  Last BM:  11/6 type 2  Height:  Ht Readings from Last 1 Encounters:  04/30/18 5\' 1"  (1.549 m)    Weight:   Wt Readings from Last 1 Encounters:  04/30/18 57.8 kg    Ideal Body Weight:  47.7 kg  BMI:  Body mass index is 24.08 kg/m.  Estimated Nutritional Needs:   Kcal:  1500-1800  Protein:  75-90 gm  Fluid:  >/=1.5 L    Morissa Obeirne, Dietetic Intern [phone]

## 2018-05-01 NOTE — Progress Notes (Signed)
Inpatient Diabetes Program Recommendations  AACE/ADA: New Consensus Statement on Inpatient Glycemic Control (2015)  Target Ranges:  Prepandial:   less than 140 mg/dL      Peak postprandial:   less than 180 mg/dL (1-2 hours)      Critically ill patients:  140 - 180 mg/dL   Lab Results  Component Value Date   GLUCAP 215 (H) 05/01/2018   HGBA1C 8.1 (A) 04/28/2018    Review of Glycemic Control Results for [name], [name] (MRN [id number]) as of 05/01/2018 15:25  Ref. Range 05/01/2018 06:58 05/01/2018 08:00 05/01/2018 08:55 05/01/2018 12:35  Glucose-Capillary Latest Ref Range: 70 - 99 mg/dL 72 86 75 215 (H)   Diabetes history: Type 1 DM Outpatient Diabetes medications: Levemir 15 units QAM, Novolog 0-12 units QID Current orders for Inpatient glycemic control: Levemir 10 units BID, Novolog 0-9 units TID, Novolog 0-5 units QHS  Inpatient Diabetes Program Recommendations:    Recommend adding meal coverage since type 1. Consider Novolog 3 units TID (assuming patient is consuming >50% of meal).  Spoke with patient regarding diabetes. Patient familiar to team, this is patient's 10th admission in last 6 months. Patient states, "Once I began vomiting and having nausea I split my dose to avoid lows." Patient verified home medications and said that on sick days she would decrease values by 50%. Last saw Dr Dwyane Dee on 04/28/18 and has plans to follow up with outpatient education and for adding meal coverage in 2-4 weeks.  Reviewed patient's current A1c of 8.1%, which is down from 10.6% (May). Explained what a A1c is and what it measures. Also reviewed goal A1c with patient, importance of good glucose control @ home, and blood sugar goals. Education provided to patient regarding sick day rules. Strongly encouraged to continue taking insulin, especially basal as prescribed doses, even on sick days and encouraged to reach out to Dr Dwyane Dee for additional recommendations. Also, encouraged the use of ketone strips to help  guide her during those times. Patient has needed supplies and checks blood sugars 3-4 times per day. Patient denies further questions. Per Dr Ronnie Derby last note, patient does not know how to count carbohydrates. When asked, patient states, "I really get confused and need additional help." Will place consult for dietitian.  Thanks, Bronson Curb, MSN, RNC-OB Diabetes Coordinator [phone] (8a-5p)

## 2018-05-01 NOTE — Progress Notes (Signed)
Patient given Narcan IV x1 per order. No change. BP slightly increased to 92/49 left arm, 92/57 right arm, HR 100. Patient will open eyes for short amount of time. Patient stated first name. Fell back asleep before stating last name. Schorr, NP notified of this and last lab results. Per Schorr, NP continue to monitor frequently. Schorr, NP states that she believes this is result of home medications and patient sleeping. Schorr, NP states to notify her if VS become worse and/or patient unresponsive. Will continue to monitor.

## 2018-05-02 LAB — BASIC METABOLIC PANEL
Anion gap: 5 (ref 5–15)
BUN: 25 mg/dL — ABNORMAL HIGH (ref 6–20)
CHLORIDE: 115 mmol/L — AB (ref 98–111)
CO2: 17 mmol/L — ABNORMAL LOW (ref 22–32)
Calcium: 8 mg/dL — ABNORMAL LOW (ref 8.9–10.3)
Creatinine, Ser: 1.55 mg/dL — ABNORMAL HIGH (ref 0.44–1.00)
GFR calc non Af Amer: 38 mL/min — ABNORMAL LOW (ref >60)
GFR, EST AFRICAN AMERICAN: 44 mL/min — AB (ref >60)
Glucose, Bld: 372 mg/dL — ABNORMAL HIGH (ref 70–99)
POTASSIUM: 4.4 mmol/L (ref 3.5–5.1)
SODIUM: 137 mmol/L (ref 135–145)

## 2018-05-02 LAB — CBC
HEMATOCRIT: 31.7 % — AB (ref 36.0–46.0)
HEMOGLOBIN: 9.7 g/dL — AB (ref 12.0–15.0)
MCH: 28.4 pg (ref 26.0–34.0)
MCHC: 30.6 g/dL (ref 30.0–36.0)
MCV: 93 fL (ref 80.0–100.0)
NRBC: 0 % (ref 0.0–0.2)
Platelets: 266 10*3/uL (ref 150–400)
RBC: 3.41 MIL/uL — ABNORMAL LOW (ref 3.87–5.11)
RDW: 14.6 % (ref 11.5–15.5)
WBC: 9.9 10*3/uL (ref 4.0–10.5)

## 2018-05-02 LAB — GLUCOSE, CAPILLARY
GLUCOSE-CAPILLARY: 332 mg/dL — AB (ref 70–99)
Glucose-Capillary: 290 mg/dL — ABNORMAL HIGH (ref 70–99)
Glucose-Capillary: 204 mg/dL — ABNORMAL HIGH (ref 70–99)

## 2018-05-02 MED ORDER — INSULIN DETEMIR 100 UNIT/ML ~~LOC~~ SOLN: 15.0000 [IU] | Freq: Every day | SUBCUTANEOUS | Status: DC

## 2018-05-02 MED ORDER — ENOXAPARIN SODIUM 40 MG/0.4ML ~~LOC~~ SOLN: 40.0000 mg | SUBCUTANEOUS | Status: DC

## 2018-05-02 MED ORDER — PROMETHAZINE HCL 12.5 MG PO TABS: 12.5000 mg | ORAL_TABLET | Freq: Four times a day (QID) | ORAL | 0 refills | Status: DC | PRN

## 2018-05-02 NOTE — Care Management Note (Deleted)
Case Management Note  Patient Details  Name: [name] MRN: [id number] Date of Birth: 1965-10-27  Subjective/Objective:                    Action/Plan:   Expected Discharge Date:  05/02/18               Expected Discharge Plan:  Home/Self Care  In-House Referral:     Discharge planning Services  CM Consult  Post Acute Care Choice:  Resumption of Svcs/PTA Provider, Home Health([facility]) Choice offered to:  Patient  DME Arranged:  N/A DME Agency:     HH Arranged:  RN, PT [place] Agency:  [facility]  Status of Service:  Completed, signed off  If discussed at [place] of Stay Meetings, dates discussed:    Additional Comments:  [name], RN 05/02/2018, 1:02 PM

## 2018-05-02 NOTE — Discharge Summary (Signed)
PATIENT DETAILS Name: [name] Age: 52 y.o. Sex: female Date of Birth: 1965/07/31 MRN: [id number]. Admitting Physician: Yvette Border, MD [id number], Yvette Ferretti, MD  Admit Date: 04/30/2018 Discharge date: 05/02/2018  Recommendations for Outpatient Follow-up:  1. Follow up with PCP in 1-2 weeks 2. Please obtain BMP/CBC in one week  Admitted From:  Home  Disposition: [place]: No  Equipment/Devices: None  Discharge Condition: Stable  CODE STATUS: FULL CODE  Diet recommendation:  Carb Modified  Brief Summary: See H&P, Labs, Consult and Test reports for all details in brief, Patient is a 52 y.o. female insulin-dependent type 2 diabetes, gastroparesis, anxiety/depression, hypertension, CAD status post CABG presented to the hospital with intractable vomiting found to have diabetic ketoacidosis-started on IV fluids, insulin infusion and admitted to the hospitalist service.  See below for further details  Brief Hospital Course: DKA: Resolved-transitioned off insulin infusion to subcutaneous insulin this morning.  DM-2 with hyperglycemia: CBGs fluctuating-increase Lantus back to home dosing of 15 units daily and SSI on discharge.  Recent A1c is 8.1.  Follow with primary endocrinologist for further optimization.  .   Gastroparesis flare: Nauseous morning-was vomiting earlier this week.  Suspect gastroparesis triggered DKA as patient decreased her insulin dosage.  Is unfortunately allergic to Reglan-managed with scheduled Zofran and as needed Phenergan.  Counseled regarding importance of small portion meals.  She is no longer vomiting-abdominal exam is benign-and is tolerating diet well.   Acute kidney injury: Hemodynamically mediated-likely secondary to DKA/vomiting.  Improving with supportive care.  Avoid nephrotoxic agents.  Hypertension: Blood pressure controlled-resume usual antihypertensives on discharge.  CAD status post CABG July 2019: No anginal  symptoms.  Continue aspirin.  Dyslipidemia: Continue statin  GERD: Continue PPI  Procedures/Studies: None  Discharge Diagnoses:  Active Problems:   Diabetic ketoacidosis [facility])   Discharge Instructions:  Activity:  As tolerated  Discharge Instructions    Call MD for:  persistant nausea and vomiting   Complete by:  As directed    Diet - low sodium heart healthy   Complete by:  As directed    Discharge instructions   Complete by:  As directed    Follow with Primary MD  Yvette Rakes, MD in 1 week  Please get a complete blood count and chemistry panel checked by your Primary MD at your next visit, and again as instructed by your Primary MD.  Get Medicines reviewed and adjusted: Please take all your medications with you for your next visit with your Primary MD  Laboratory/radiological data: Please request your Primary MD to go over all hospital tests and procedure/radiological results at the follow up, please ask your Primary MD to get all Hospital records sent to his/her office.  In some cases, they will be blood work, cultures and biopsy results pending at the time of your discharge. Please request that your primary care M.D. follows up on these results.  Also Note the following: If you experience worsening of your admission symptoms, develop shortness of breath, life threatening emergency, suicidal or homicidal thoughts you must seek medical attention immediately by calling 911 or calling your MD immediately  if symptoms less severe.  You must read complete instructions/literature along with all the possible adverse reactions/side effects for all the Medicines you take and that have been prescribed to you. Take any new Medicines after you have completely understood and accpet all the possible adverse reactions/side effects.   Do not drive when taking Pain medications or sleeping medications (Benzodaizepines)  Do not take more than prescribed Pain, Sleep and Anxiety  Medications. It is not advisable to combine anxiety,sleep and pain medications without talking with your primary care practitioner  Special Instructions: If you have smoked or chewed Tobacco  in the last 2 yrs please stop smoking, stop any regular Alcohol  and or any Recreational drug use.  Wear Seat belts while driving.  Please note: You were cared for by a hospitalist during your hospital stay. Once you are discharged, your primary care physician will handle any further medical issues. Please note that NO REFILLS for any discharge medications will be authorized once you are discharged, as it is imperative that you return to your primary care physician (or establish a relationship with a primary care physician if you do not have one) for your post hospital discharge needs so that they can reassess your need for medications and monitor your lab values.   Increase activity slowly   Complete by:  As directed      Allergies as of 05/02/2018      Reactions   Anesthetics, Amide Nausea And Vomiting   Penicillins Diarrhea, Nausea And Vomiting, Other (See Comments)   Has patient had a PCN reaction causing immediate rash, facial/tongue/throat swelling, SOB or lightheadedness with hypotension: Yes Has patient had a PCN reaction causing severe rash involving mucus membranes or skin necrosis: No Has patient had a PCN reaction that required hospitalization No Has patient had a PCN reaction occurring within the last 10 years: Yes  If all of the above answers are "NO", then may proceed with Cephalosporin use.   Buprenorphine Hcl Rash   Encainide Nausea And Vomiting   Metoclopramide Other (See Comments)   Dystonia, muscle rigidity.  Patient states she is only allergic to the "pill form, not the IV"      Medication List    STOP taking these medications   atorvastatin 80 MG tablet Commonly known as:  LIPITOR     TAKE these medications   acetaminophen 325 MG tablet Commonly known as:  TYLENOL Take  2 tablets (650 mg total) by mouth every 6 (six) hours as needed for mild pain.   aspirin 325 MG EC tablet Take 1 tablet (325 mg total) by mouth daily.   busPIRone 10 MG tablet Commonly known as:  BUSPAR Take 1 tablet (10 mg total) by mouth 3 (three) times daily.   diphenhydrAMINE 25 mg capsule Commonly known as:  BENADRYL Take 25 mg by mouth every 6 (six) hours as needed for allergies.   docusate sodium 100 MG capsule Commonly known as:  COLACE Take 1 capsule (100 mg total) by mouth daily as needed for mild constipation.   furosemide 40 MG tablet Commonly known as:  LASIX Take 1 tablet (40 mg total) by mouth daily. What changed:    when to take this  reasons to take this   glucose blood test strip Use as instructed   glucose blood test strip Check sugar 4 times daily.   insulin aspart 100 UNIT/ML injection Commonly known as:  novoLOG Inject 0-12 Units into the skin 4 (four) times daily -  before meals and at bedtime. Uses a sliding scale: 70-119 = 0 units, 120-150 = 2 units, 151-180 =  4 units, 181-210 = 5 units, 211-240 = 6 units, 241-270 = 8 units, 271-300 = 9 units, 301-330 = 10 units, 331-360 = 11 units, 361-400 = 12 units, greater than 400 notify MD.   insulin detemir 100 UNIT/ML injection Commonly  known as:  LEVEMIR Inject 0.15 mLs (15 Units total) into the skin every morning.   Insulin Syringe-Needle U-100 31G X 5/16" 0.5 ML Misc 1 each by Does not apply route 4 (four) times daily.   loperamide 2 MG capsule Commonly known as:  IMODIUM Take 1 capsule (2 mg total) by mouth as needed for diarrhea or loose stools.   metoprolol tartrate 25 MG tablet Commonly known as:  LOPRESSOR Take 0.5 tablets (12.5 mg total) by mouth 2 (two) times daily.   ONETOUCH DELICA PLUS [id number] Misc 1 Units by Does not apply route as directed.   ONETOUCH VERIO w/Device Kit Use as directed   pantoprazole 40 MG tablet Commonly known as:  PROTONIX Take 1 tablet (40 mg total) by  mouth daily.   potassium chloride 10 MEQ tablet Commonly known as:  K-DUR Take 1 tablet (10 mEq total) by mouth daily. What changed:    when to take this  reasons to take this   pregabalin 150 MG capsule Commonly known as:  LYRICA Take 1 capsule (150 mg total) by mouth 2 (two) times daily.   promethazine 12.5 MG tablet Commonly known as:  PHENERGAN Take 1 tablet (12.5 mg total) by mouth every 6 (six) hours as needed for nausea or vomiting.   pyridostigmine 60 MG tablet Commonly known as:  MESTINON Take 60 mg by mouth 3 (three) times daily.      Follow-up Information    [facility], [facility] Follow up.   Specialty:  Home Health Services Why:  home health services arranged Contact information: [address] [place] [address] [phone]          Allergies  Allergen Reactions  . Anesthetics, Amide Nausea And Vomiting  . Penicillins Diarrhea, Nausea And Vomiting and Other (See Comments)    Has patient had a PCN reaction causing immediate rash, facial/tongue/throat swelling, SOB or lightheadedness with hypotension: Yes Has patient had a PCN reaction causing severe rash involving mucus membranes or skin necrosis: No Has patient had a PCN reaction that required hospitalization No Has patient had a PCN reaction occurring within the last 10 years: Yes  If all of the above answers are "NO", then may proceed with Cephalosporin use.   . Buprenorphine Hcl Rash  . Encainide Nausea And Vomiting  . Metoclopramide Other (See Comments)    Dystonia, muscle rigidity.  Patient states she is only allergic to the "pill form, not the IV"    Consultations:   None   Other Procedures/Studies: [place] Ekg Site Rite  Result Date: 05/01/2018 If Site Rite image not attached, placement could not be confirmed due to current cardiac rhythm.  [place] Ekg Site Rite  Result Date: 04/30/2018 If Site Rite image not attached, placement could not be confirmed due to current  cardiac rhythm.    TODAY-DAY OF DISCHARGE:  Subjective:   [name] today has no headache,no chest abdominal pain,no new weakness tingling or numbness, feels much better wants to go home today.   Objective:   Blood pressure (!) 113/54, pulse 77, temperature 98.3 F (36.8 C), temperature source Oral, resp. rate 17, height _0  (1.549 m), weight 57.8 kg, last menstrual period 04/24/2018, SpO2 100 %.  Intake/Output Summary (Last 24 hours) at 05/02/2018 1029 Last data filed at 05/02/2018 0633 Gross per 24 hour  Intake 3125.24 ml  Output -  Net 3125.24 ml   Filed Weights   04/30/18 2100  Weight: 57.8 kg    Exam: Awake  Alert, Oriented *3, No new F.N deficits, Normal affect [place].AT,PERRAL Supple Neck,No JVD, No cervical lymphadenopathy appriciated.  Symmetrical Chest wall movement, Good air movement bilaterally, CTAB RRR,No Gallops,Rubs or new Murmurs, No Parasternal Heave +ve B.Sounds, Abd Soft, Non tender, No organomegaly appriciated, No rebound -guarding or rigidity. No Cyanosis, Clubbing or edema, No new Rash or bruise   PERTINENT RADIOLOGIC STUDIES: [place] Ekg Site Rite  Result Date: 05/01/2018 If Site Rite image not attached, placement could not be confirmed due to current cardiac rhythm.  [place] Ekg Site Rite  Result Date: 04/30/2018 If Site Rite image not attached, placement could not be confirmed due to current cardiac rhythm.    PERTINENT LAB RESULTS: CBC: Recent Labs    04/30/18 1017 04/30/18 1039 05/02/18 0434  WBC 18.2*  --  9.9  HGB 12.7 15.6* 9.7*  HCT 42.4 46.0 31.7*  PLT 387  --  266   CMET CMP     Component Value Date/Time   NA 137 05/02/2018 0816   NA 138 03/20/2018 1204   K 4.4 05/02/2018 0816   CL 115 (H) 05/02/2018 0816   CO2 17 (L) 05/02/2018 0816   GLUCOSE 372 (H) 05/02/2018 0816   BUN 25 (H) 05/02/2018 0816   BUN 13 03/20/2018 1204   CREATININE 1.55 (H) 05/02/2018 0816   CREATININE 1.11 (H) 09/04/2016 1459   CALCIUM 8.0 (L)  05/02/2018 0816   PROT 5.9 (L) 05/01/2018 1040   ALBUMIN 3.0 (L) 05/01/2018 1040   AST 20 05/01/2018 1040   ALT 18 05/01/2018 1040   ALKPHOS 70 05/01/2018 1040   BILITOT 0.7 05/01/2018 1040   GFRNONAA 38 (L) 05/02/2018 0816   GFRNONAA 58 (L) 09/04/2016 1459   GFRAA 44 (L) 05/02/2018 0816   GFRAA 67 09/04/2016 1459    GFR Estimated Creatinine Clearance: 35.5 mL/min (A) (by C-G formula based on SCr of 1.55 mg/dL (H)). Recent Labs    04/30/18 1017  LIPASE 21   No results for input(s): CKTOTAL, CKMB, CKMBINDEX, TROPONINI in the last 72 hours. Invalid input(s): POCBNP No results for input(s): DDIMER in the last 72 hours. No results for input(s): HGBA1C in the last 72 hours. No results for input(s): CHOL, HDL, LDLCALC, TRIG, CHOLHDL, LDLDIRECT in the last 72 hours. No results for input(s): TSH, T4TOTAL, T3FREE, THYROIDAB in the last 72 hours.  Invalid input(s): FREET3 No results for input(s): VITAMINB12, FOLATE, FERRITIN, TIBC, IRON, RETICCTPCT in the last 72 hours. Coags: No results for input(s): INR in the last 72 hours.  Invalid input(s): PT Microbiology: Recent Results (from the past 240 hour(s))  MRSA PCR Screening     Status: None   Collection Time: 04/30/18  9:11 PM  Result Value Ref Range Status   MRSA by PCR NEGATIVE NEGATIVE Final    Comment:        The GeneXpert MRSA Assay (FDA approved for NASAL specimens only), is one component of a comprehensive MRSA colonization surveillance program. It is not intended to diagnose MRSA infection nor to guide or monitor treatment for MRSA infections. Performed at [facility], [place] [address], [place], [place] [address]     FURTHER DISCHARGE INSTRUCTIONS:  Get Medicines reviewed and adjusted: Please take all your medications with you for your next visit with your Primary MD  Laboratory/radiological data: Please request your Primary MD to go over all hospital tests and procedure/radiological results at the follow  up, please ask your Primary MD to get all Hospital records sent to his/her office.  In some cases, they will be blood work, cultures and biopsy results pending at the time of your discharge. Please request that your primary care M.D. goes through all the records of your hospital data and follows up on these results.  Also Note the following: If you experience worsening of your admission symptoms, develop shortness of breath, life threatening emergency, suicidal or homicidal thoughts you must seek medical attention immediately by calling 911 or calling your MD immediately  if symptoms less severe.  You must read complete instructions/literature along with all the possible adverse reactions/side effects for all the Medicines you take and that have been prescribed to you. Take any new Medicines after you have completely understood and accpet all the possible adverse reactions/side effects.   Do not drive when taking Pain medications or sleeping medications (Benzodaizepines)  Do not take more than prescribed Pain, Sleep and Anxiety Medications. It is not advisable to combine anxiety,sleep and pain medications without talking with your primary care practitioner  Special Instructions: If you have smoked or chewed Tobacco  in the last 2 yrs please stop smoking, stop any regular Alcohol  and or any Recreational drug use.  Wear Seat belts while driving.  Please note: You were cared for by a hospitalist during your hospital stay. Once you are discharged, your primary care physician will handle any further medical issues. Please note that NO REFILLS for any discharge medications will be authorized once you are discharged, as it is imperative that you return to your primary care physician (or establish a relationship with a primary care physician if you do not have one) for your post hospital discharge needs so that they can reassess your need for medications and monitor your lab values.  Total Time spent  coordinating discharge including counseling, education and face to face time equals 35  minutes.  SignedOren Binet 05/02/2018 10:29 AM

## 2018-05-02 NOTE — Care Management Note (Signed)
Case Management Note  Patient Details  Name: [name] MRN: [id number] Date of Birth: [birth year]  Subjective/Objective:      DKA              Action/Plan: Transition to home with home health services to follow.  Pt with transportation to home.  Expected Discharge Date:  05/02/18               Expected Discharge Plan:  Home/Self Care  In-House Referral:     Discharge planning Services  CM Consult  Post Acute Care Choice:  Resumption of Svcs/PTA Provider, Home Health([facility]) Choice offered to:  Patient  DME Arranged:  N/A DME Agency:    N/A HH Arranged:  RN, PT [place] Agency:  [place]  Status of Service:  Completed, signed off  If discussed at [place] of Stay Meetings, dates discussed:    Additional Comments:  [name], RN 05/02/2018, 1:00 PM

## 2018-05-02 NOTE — Progress Notes (Signed)
PT Cancellation Note  Patient Details Name: [name] MRN: [id number] DOB: Apr 18, 1966   Cancelled Treatment:    Reason Eval/Treat Not Completed: Other (comment).  Pt has her L femoral cath in place so will hold therapy until she is able to move.   Ramond Dial 05/02/2018, 10:03 AM   Mee Hives, PT MS Acute Rehab Dept. Number: [place]

## 2018-05-02 NOTE — Progress Notes (Signed)
Inpatient Diabetes Program Recommendations  AACE/ADA: New Consensus Statement on Inpatient Glycemic Control (2015)  Target Ranges:  Prepandial:   less than 140 mg/dL      Peak postprandial:   less than 180 mg/dL (1-2 hours)      Critically ill patients:  140 - 180 mg/dL   Lab Results  Component Value Date   GLUCAP 332 (H) 05/02/2018   HGBA1C 8.1 (A) 04/28/2018    Review of Glycemic Control Results for [name], [name] (MRN [id number]) as of 05/02/2018 10:10  Ref. Range 05/01/2018 17:20 05/01/2018 21:29 05/02/2018 05:32 05/02/2018 08:28  Glucose-Capillary Latest Ref Range: 70 - 99 mg/dL 442 (H) 198 (H) 290 (H) 332 (H)   Diabetes history: Type 1 DM (requires basal and meal coverage) Outpatient Diabetes medications: Levemir 15 units QAM, Novolog 0-12 units QID Current orders for Inpatient glycemic control: Levemir 10 units BID, Novolog 0-9 units TID, Novolog 0-5 units QHS  Inpatient Diabetes Program Recommendations:    Recommend adding meal coverage since type 1; post prandials exceeded 400's on 11/7. Consider Novolog 4 units TID (assuming patient is consuming >50% of meal) and increasing Levemir to 15 units QD (patient's home dose).  Thanks, Bronson Curb, MSN, RNC-OB Diabetes Coordinator [phone] (8a-5p)

## 2018-05-02 NOTE — Care Management Important Message (Signed)
Important Message  Patient Details  Name: [name] MRN: [id number] Date of Birth: 05-16-1966   Medicare Important Message Given:  Yes  The IM on my table was signed incorrectly.  Patient was given the paper copy to sign.    Radonna Bracher 05/02/2018, 4:21 PM

## 2018-05-02 NOTE — Progress Notes (Signed)
Patient diaphoretic and c/o being cold. BG checked, 290. Patient slept, did not urinate all night. NT asked patient to use BSC. Patient agreed and did urinate. Will continue to monitor.

## 2018-05-02 NOTE — Progress Notes (Signed)
[name] to be D/C'd Home per MD order.  Discussed with the patient and all questions fully answered.  VSS, Skin clean, dry and intact without evidence of skin break down, no evidence of skin tears noted. IV catheter discontinued intact. Site without signs and symptoms of complications. Dressing and pressure applied.  An After Visit Summary was printed and given to the patient. Patient received prescription.  D/c education completed with patient/family including follow up instructions, medication list, d/c activities limitations if indicated, with other d/c instructions as indicated by MD - patient able to verbalize understanding, all questions fully answered.   Patient instructed to return to ED, call 911, or call MD for any changes in condition.   Patient escorted via [place], and D/C home via private auto.  Luci Bank 05/02/2018 1:23 PM

## 2018-05-03 DIAGNOSIS — E1065 Type 1 diabetes mellitus with hyperglycemia: Secondary | ICD-10-CM | POA: Diagnosis not present

## 2018-05-03 DIAGNOSIS — E860 Dehydration: Secondary | ICD-10-CM | POA: Diagnosis not present

## 2018-05-05 ENCOUNTER — Telehealth: Payer: Self-pay

## 2018-05-05 DIAGNOSIS — E1065 Type 1 diabetes mellitus with hyperglycemia: Secondary | ICD-10-CM | POA: Diagnosis not present

## 2018-05-05 DIAGNOSIS — E860 Dehydration: Secondary | ICD-10-CM | POA: Diagnosis not present

## 2018-05-05 NOTE — Telephone Encounter (Signed)
Transition Care Management Follow-up Telephone Call Date of discharge and from where: 05/02/2018, [facility]  Call placed to patient # [phone], unable to leave message, voicemail full.   Call placed to sister, Donato Schultz, sister, # [phone], message states unable to complete the call at this time. Call placed to # [phone], message left requesting a call back from [place] to this CM # [phone]

## 2018-05-06 ENCOUNTER — Telehealth: Payer: Self-pay

## 2018-05-06 DIAGNOSIS — E860 Dehydration: Secondary | ICD-10-CM | POA: Diagnosis not present

## 2018-05-06 DIAGNOSIS — E1065 Type 1 diabetes mellitus with hyperglycemia: Secondary | ICD-10-CM | POA: Diagnosis not present

## 2018-05-06 NOTE — Telephone Encounter (Signed)
Transition Care Management Follow-up Telephone Call Date of discharge and from where: 05/02/2018, [facility]   Call placed to # [phone] and message was left requesting call back to this CM # [phone].

## 2018-05-09 DIAGNOSIS — E1065 Type 1 diabetes mellitus with hyperglycemia: Secondary | ICD-10-CM | POA: Diagnosis not present

## 2018-05-09 DIAGNOSIS — E860 Dehydration: Secondary | ICD-10-CM | POA: Diagnosis not present

## 2018-05-12 ENCOUNTER — Other Ambulatory Visit: Payer: Self-pay

## 2018-05-12 ENCOUNTER — Encounter: Payer: Medicare Other | Attending: Endocrinology | Admitting: Nutrition

## 2018-05-12 DIAGNOSIS — E1040 Type 1 diabetes mellitus with diabetic neuropathy, unspecified: Secondary | ICD-10-CM

## 2018-05-12 DIAGNOSIS — Z713 Dietary counseling and surveillance: Secondary | ICD-10-CM | POA: Insufficient documentation

## 2018-05-12 DIAGNOSIS — E1065 Type 1 diabetes mellitus with hyperglycemia: Secondary | ICD-10-CM | POA: Insufficient documentation

## 2018-05-12 MED ORDER — GLUCOSE BLOOD VI STRP: ORAL_STRIP | 12 refills | Status: DC

## 2018-05-13 DIAGNOSIS — E1065 Type 1 diabetes mellitus with hyperglycemia: Secondary | ICD-10-CM | POA: Diagnosis not present

## 2018-05-13 DIAGNOSIS — E860 Dehydration: Secondary | ICD-10-CM | POA: Diagnosis not present

## 2018-05-14 NOTE — Patient Instructions (Addendum)
Take Levemir-15u every morning Take Novolog 2u before meals and if blood sugar is over 150, take additional insulin per written instructions.   Call if questions, or if blood sugars remain high, or drop low

## 2018-05-14 NOTE — Progress Notes (Signed)
Patient reports that she hasn't been tested her blood sugars because she ran out of test strips and non are at the pharmacy.   She reports taking 2u of Novolog ac meals,, but usually no extra, even when high.  We reviewed the need to take extra insulin and how to do this.Written instructions given to her for this She admits that she is afraid of taking the Novolog before eating, in case she can not keep the food down.  We discussed what to do when that happens.  She will sip on Regular gingerale to prevent low blood sugar.   .She reports taking 15u of Levemir qAM Also discussed the idea that the Novolog dose is dependent on how much carbohydrate she eats.  We reviewed what those were, and but she admits that she does not eat that much at meals--rarely more than 15-20 grams.   We also reviewed the need for site rotation, when giving insulin, and she reported good understanding of this. Reviewed the idea of having a balanced, meal, even if it is just 1-2 bites of protein, and to try to limit the fat, which will keep the food in her stomach longer.  We reviewed what proteins and fats were, and she was given a list of these.  She had no final questions.

## 2018-05-15 DIAGNOSIS — E1065 Type 1 diabetes mellitus with hyperglycemia: Secondary | ICD-10-CM | POA: Diagnosis not present

## 2018-05-15 DIAGNOSIS — E860 Dehydration: Secondary | ICD-10-CM | POA: Diagnosis not present

## 2018-05-30 ENCOUNTER — Telehealth: Payer: Self-pay | Admitting: Family Medicine

## 2018-05-30 DIAGNOSIS — E1040 Type 1 diabetes mellitus with diabetic neuropathy, unspecified: Secondary | ICD-10-CM

## 2018-05-30 NOTE — Telephone Encounter (Signed)
Physical therapist Dwyane Dee called to inform PCP that patient has denied care several times, and is not compliant to their therapy plan. However she does need a walking cane order sent to [facility]. Please follow up  [phone]

## 2018-06-02 NOTE — Telephone Encounter (Signed)
Will route to PCP for review. 

## 2018-06-03 ENCOUNTER — Encounter: Payer: Self-pay | Admitting: Cardiovascular Disease

## 2018-06-03 ENCOUNTER — Ambulatory Visit (INDEPENDENT_AMBULATORY_CARE_PROVIDER_SITE_OTHER): Payer: Medicare Other | Admitting: Cardiovascular Disease

## 2018-06-03 VITALS — BP 118/60 | HR 83 | Ht 61.0 in | Wt 135.0 lb

## 2018-06-03 DIAGNOSIS — I2510 Atherosclerotic heart disease of native coronary artery without angina pectoris: Secondary | ICD-10-CM

## 2018-06-03 DIAGNOSIS — I5022 Chronic systolic (congestive) heart failure: Secondary | ICD-10-CM | POA: Diagnosis not present

## 2018-06-03 DIAGNOSIS — Z951 Presence of aortocoronary bypass graft: Secondary | ICD-10-CM

## 2018-06-03 MED ORDER — ASPIRIN EC 81 MG PO TBEC: 81.0000 mg | DELAYED_RELEASE_TABLET | Freq: Every day | ORAL | Status: AC

## 2018-06-03 MED ORDER — ATORVASTATIN CALCIUM 80 MG PO TABS: 80.0000 mg | ORAL_TABLET | Freq: Every day | ORAL | 3 refills | Status: DC

## 2018-06-03 MED ORDER — LOSARTAN POTASSIUM 25 MG PO TABS: 25.0000 mg | ORAL_TABLET | Freq: Every day | ORAL | 11 refills | Status: DC

## 2018-06-03 NOTE — Patient Instructions (Signed)
Medication Instructions:  Your physician has recommended you make the following change in your medication:   DECREASE Aspirin to 81 mg once daily START Losartan 25 mg once daily  If you need a refill on your cardiac medications before your next appointment, please call your pharmacy.   Lab work: Your physician recommends that you return for lab work in: 3 weeks for basic metabolic panel  If you have labs (blood work) drawn today and your tests are completely normal, you will receive your results only by: Marland Kitchen MyChart Message (if you have MyChart) OR . A paper copy in the mail If you have any lab test that is abnormal or we need to change your treatment, we will call you to review the results.  Testing/Procedures: None Ordered   Follow-Up: At [facility], you and your health needs are our priority.  As part of our continuing mission to provide you with exceptional heart care, we have created designated Provider Care Teams.  These Care Teams include your primary Cardiologist (physician) and Advanced Practice Providers (APPs -  Physician Assistants and Nurse Practitioners) who all work together to provide you with the care you need, when you need it. You will need a follow up appointment in:  3 months.  You may see Mertie Moores, MD or one of the following Advanced Practice Providers on your designated Care Team: Richardson Dopp, PA-C [place], [place] . Daune Perch, NP

## 2018-06-03 NOTE — Telephone Encounter (Signed)
Prescription has been done.

## 2018-06-03 NOTE — Progress Notes (Signed)
Cardiology Office Note:    Date:  06/03/2018   ID:  [name], DOB 09/20/1965, MRN [id number]  PCP:  Newlin, Enobong, MD  Cardiologist:  Philip Nahser, MD   Referring MD: Newlin, Enobong, MD   Problem list 1.  Coronary artery disease-status post coronary artery bypass grafting 2.  Poorly controlled diabetes mellitus-complicated by noncompliance and neuropathy 3.  Hyperlipidemia  Chief Complaint  Patient presents with  . Coronary Artery Disease     Sept. 10, 2019   [name] is a 52 y.o. female with a hx of poorly controlled diabetes mellitus and coronary artery disease.  She is status post coronary artery bypass grafting on January 14, 2018: She underwent CABG x 3 utilizing LIMA to LAD, SVG to PDA, and SVG to OM.  She is been to the emergency room over 50 times in the past year for diabetic ketoacidosis and gastroparesis. She was in the emergency room on September 1 with intractable nausea and vomiting.  She was not in DKA at that time.  Seen in the office for the first time today . Diabetes is under fairly good control currently   Has lots of issues with gastroparesis   Dec. 10, 2019:  [name] is seen back today for follow-up of her coronary artery disease-status post coronary artery bypass grafting.  She also has chronic systolic congestive heart failure and a history of diabetes mellitus. Glucose control is better.  She still on aspirin 325 mg a day.  We will reduce the dose to 81 mg a day.   Past Medical History:  Diagnosis Date  . AKI (acute kidney injury) (HCC)   . Anemia, iron deficiency   . Anxiety and depression 05/18/2015  . CAD (coronary artery disease), native coronary artery 01/11/2018   Non-STEMI with severe three-vessel disease noted July 2019  . Depression   . Diabetes mellitus without complication (HCC)   . Diabetic gastroparesis (HCC)   . Diabetic neuropathy, type I diabetes mellitus (HCC) 05/18/2015  . DKA (diabetic ketoacidoses) (HCC)  06/16/2017  . DKA, type 1 (HCC) 03/18/2016  . Essential hypertension   . Gastroparesis   . GERD (gastroesophageal reflux disease)   . HLD (hyperlipidemia)   . MDD (major depressive disorder), single episode, severe , no psychosis (HCC)   . Non-ST elevation (NSTEMI) myocardial infarction (HCC)   . S/P CABG x 3 01/14/2018  . Tardive dyskinesia   . Vitamin B12 deficiency 08/16/2015    Past Surgical History:  Procedure Laterality Date  . CARDIAC SURGERY    . COLONOSCOPY  09/27/2014   at [facility]  . CORONARY ARTERY BYPASS GRAFT N/A 01/14/2018   Procedure: CORONARY ARTERY BYPASS GRAFTING (CABG)X3, RIGHT AND LEFT SAPHENOUS VEIN HARVEST, MAMMARY TAKE DOWN. MAMMARY TO LAD, SVG TO PD, SVG TO DISTAL CIRC.;  Surgeon: Gerhardt, Edward B, MD;  Location: [facility] OR;  Service: Open Heart Surgery;  Laterality: N/A;  . ESOPHAGOGASTRODUODENOSCOPY  09/27/2014   at [facility], Dr Lester Hurrelbrink. biospy neg for celiac, neg for H pylori.   . EYE SURGERY    . gailstones    . IR FLUORO GUIDE CV LINE RIGHT  02/01/2017  . IR FLUORO GUIDE CV LINE RIGHT  03/06/2017  . IR FLUORO GUIDE CV LINE RIGHT  03/25/2017  . IR GENERIC HISTORICAL  01/24/2016   IR FLUORO GUIDE CV LINE RIGHT 01/24/2016 Darrell K Allred, PA-C [facility]  . IR GENERIC HISTORICAL  01/24/2016   IR US GUIDE VASC ACCESS RIGHT 01/24/2016 Darrell K Allred, PA-C   [facility]  . IR US GUIDE VASC ACCESS RIGHT  02/01/2017  . IR US GUIDE VASC ACCESS RIGHT  03/06/2017  . IR US GUIDE VASC ACCESS RIGHT  03/25/2017  . LEFT HEART CATH AND CORONARY ANGIOGRAPHY N/A 01/07/2018   Procedure: LEFT HEART CATH AND CORONARY ANGIOGRAPHY;  Surgeon: Kelly, Craghead A, MD;  Location: [facility] INVASIVE CV LAB;  Service: Cardiovascular;  Laterality: N/A;  . POSTERIOR VITRECTOMY AND MEMBRANE PEEL-LEFT EYE  09/28/2002  . POSTERIOR VITRECTOMY AND MEMBRANE PEEL-RIGHT EYE  03/16/2002  . RETINAL DETACHMENT SURGERY    . TEE WITHOUT CARDIOVERSION N/A 01/14/2018   Procedure: TRANSESOPHAGEAL ECHOCARDIOGRAM (TEE);   Surgeon: Gerhardt, Edward B, MD;  Location: [facility] OR;  Service: Open Heart Surgery;  Laterality: N/A;    Current Medications: Current Meds  Medication Sig  . acetaminophen (TYLENOL) 325 MG tablet Take 2 tablets (650 mg total) by mouth every 6 (six) hours as needed for mild pain.  . atorvastatin (LIPITOR) 80 MG tablet Take 1 tablet (80 mg total) by mouth daily.  . Blood Glucose Monitoring Suppl (ONETOUCH VERIO) w/Device KIT Use as directed  . busPIRone (BUSPAR) 10 MG tablet Take 1 tablet (10 mg total) by mouth 3 (three) times daily.  . diphenhydrAMINE (BENADRYL) 25 mg capsule Take 25 mg by mouth every 6 (six) hours as needed for allergies.  . docusate sodium (COLACE) 100 MG capsule Take 1 capsule (100 mg total) by mouth daily as needed for mild constipation.  . furosemide (LASIX) 40 MG tablet Take 1 tablet (40 mg total) by mouth daily. (Patient taking differently: Take 40 mg by mouth daily as needed for fluid or edema. )  . glucose blood (ACCU-CHEK AVIVA PLUS) test strip Use as instructed to test blood sugar 5 times daily Dx E10.65  . insulin aspart (NOVOLOG) 100 UNIT/ML injection Inject 0-12 Units into the skin 4 (four) times daily -  before meals and at bedtime. Uses a sliding scale: 70-119 = 0 units, 120-150 = 2 units, 151-180 =  4 units, 181-210 = 5 units, 211-240 = 6 units, 241-270 = 8 units, 271-300 = 9 units, 301-330 = 10 units, 331-360 = 11 units, 361-400 = 12 units, greater than 400 notify MD.  . insulin detemir (LEVEMIR) 100 UNIT/ML injection Inject 0.15 mLs (15 Units total) into the skin every morning.  . Insulin Syringe-Needle U-100 (BD INSULIN SYRINGE ULTRAFINE) 31G X 5/16" 0.5 ML MISC 1 each by Does not apply route 4 (four) times daily.  . Lancets (ONETOUCH DELICA PLUS LANCET33G) MISC 1 Units by Does not apply route as directed.  . loperamide (IMODIUM) 2 MG capsule Take 1 capsule (2 mg total) by mouth as needed for diarrhea or loose stools.  . metoprolol tartrate (LOPRESSOR) 25 MG tablet  Take 0.5 tablets (12.5 mg total) by mouth 2 (two) times daily.  . pantoprazole (PROTONIX) 40 MG tablet Take 1 tablet (40 mg total) by mouth daily.  . potassium chloride (K-DUR) 10 MEQ tablet Take 1 tablet (10 mEq total) by mouth daily.  . pregabalin (LYRICA) 150 MG capsule Take 1 capsule (150 mg total) by mouth 2 (two) times daily.  . promethazine (PHENERGAN) 12.5 MG tablet Take 1 tablet (12.5 mg total) by mouth every 6 (six) hours as needed for nausea or vomiting.  . pyridostigmine (MESTINON) 60 MG tablet Take 60 mg by mouth 3 (three) times daily.  . [DISCONTINUED] aspirin EC 325 MG EC tablet Take 1 tablet (325 mg total) by mouth daily.  . [DISCONTINUED] atorvastatin (  LIPITOR) 80 MG tablet Take 80 mg by mouth daily.     Allergies:   Anesthetics, amide; Penicillins; Buprenorphine hcl; Encainide; and Metoclopramide   Social History   Socioeconomic History  . Marital status: Single    Spouse name: Not on file  . Number of children: 1  . Years of education: Not on file  . Highest education level: Not on file  Occupational History  . Occupation: Unemployed  Social Needs  . Financial resource strain: Patient refused  . Food insecurity:    Worry: Not on file    Inability: Not on file  . Transportation needs:    Medical: Patient refused    Non-medical: Patient refused  Tobacco Use  . Smoking status: Never Smoker  . Smokeless tobacco: Never Used  Substance and Sexual Activity  . Alcohol use: No  . Drug use: No  . Sexual activity: Not Currently  Lifestyle  . Physical activity:    Days per week: Patient refused    Minutes per session: Not on file  . Stress: Patient refused  Relationships  . Social connections:    Talks on phone: Not on file    Gets together: Not on file    Attends religious service: Not on file    Active member of club or organization: Not on file    Attends meetings of clubs or organizations: Not on file    Relationship status: Not on file  Other Topics  Concern  . Not on file  Social History Narrative  . Not on file     Family History: The patient's family history includes Cystic fibrosis in her mother; Diabetes in her brother; Hypertension in her father and maternal grandmother.  ROS:   Please see the history of present illness.     All other systems reviewed and are negative.  EKGs/Labs/Other Studies Reviewed:    The following studies were reviewed today:   EKG:     Recent Labs: 04/13/2018: Magnesium 2.1 05/01/2018: ALT 18 05/02/2018: BUN 25; Creatinine, Ser 1.55; Hemoglobin 9.7; Platelets 266; Potassium 4.4; Sodium 137  Recent Lipid Panel    Component Value Date/Time   CHOL 121 01/07/2018 0510   TRIG 101 01/07/2018 0510   HDL 47 01/07/2018 0510   CHOLHDL 2.6 01/07/2018 0510   VLDL 20 01/07/2018 0510   LDLCALC 54 01/07/2018 0510    Physical Exam:     Physical Exam: Blood pressure 118/60, pulse 83, height 5' 1" (1.549 m), weight 135 lb (61.2 kg), SpO2 99 %.  GEN:  Well nourished, well developed in no acute distress HEENT: Normal NECK: No JVD; No carotid bruits LYMPHATICS: No lymphadenopathy CARDIAC: RRR  ,  Moderate amount of keloid formation  RESPIRATORY:  Clear to auscultation without rales, wheezing or rhonchi  ABDOMEN: Soft, non-tender, non-distended MUSCULOSKELETAL:  No edema; No deformity  SKIN: Warm and dry NEUROLOGIC:  Alert and oriented x 3    ASSESSMENT:    1. Coronary artery disease involving native coronary artery of native heart without angina pectoris   2. S/P CABG (coronary artery bypass graft)   3. Chronic systolic heart failure (HCC)    PLAN:    In order of problems listed above:  1.  Coronary artery disease: .  Seems to be doing well following her coronary artery bypass grafting.  We will reduce her aspirin to 81 mg a day.  Check fasting lipids, liver enzymes, basic metabolic profile when she seen again in 3 months.  2.  Chronic systolic  congestive heart failure: Her ejection fraction  is 30 to 35%.  She has long standing diabetes mellitus and has an ischemic cardiomyopathy.  She is on metoprolol and Lasix.  We will add losartan 25 mg a day. We will check a basic metabolic profile in 3 weeks.     Medication Adjustments/Labs and Tests Ordered: Current medicines are reviewed at length with the patient today.  Concerns regarding medicines are outlined above.  Orders Placed This Encounter  Procedures  . Basic Metabolic Panel (BMET)   Meds ordered this encounter  Medications  . atorvastatin (LIPITOR) 80 MG tablet    Sig: Take 1 tablet (80 mg total) by mouth daily.    Dispense:  90 tablet    Refill:  3  . aspirin EC 81 MG tablet    Sig: Take 1 tablet (81 mg total) by mouth daily.  Marland Kitchen losartan (COZAAR) 25 MG tablet    Sig: Take 1 tablet (25 mg total) by mouth daily.    Dispense:  30 tablet    Refill:  11     Patient Instructions  Medication Instructions:  Your physician has recommended you make the following change in your medication:   DECREASE Aspirin to 81 mg once daily START Losartan 25 mg once daily  If you need a refill on your cardiac medications before your next appointment, please call your pharmacy.   Lab work: Your physician recommends that you return for lab work in: 3 weeks for basic metabolic panel  If you have labs (blood work) drawn today and your tests are completely normal, you will receive your results only by: Marland Kitchen MyChart Message (if you have MyChart) OR . A paper copy in the mail If you have any lab test that is abnormal or we need to change your treatment, we will call you to review the results.  Testing/Procedures: None Ordered   Follow-Up: At [facility], you and your health needs are our priority.  As part of our continuing mission to provide you with exceptional heart care, we have created designated Provider Care Teams.  These Care Teams include your primary Cardiologist (physician) and Advanced Practice Providers (APPs -   Physician Assistants and Nurse Practitioners) who all work together to provide you with the care you need, when you need it. You will need a follow up appointment in:  3 months.  You may see Mertie Moores, MD or one of the following Advanced Practice Providers on your designated Care Team: Richardson Dopp, PA-C [place], [place] . Daune Perch, NP      Signed, Mertie Moores, MD  06/03/2018 3:22 PM    [facility]

## 2018-06-04 ENCOUNTER — Ambulatory Visit: Payer: Self-pay | Admitting: Endocrinology

## 2018-06-04 NOTE — Telephone Encounter (Signed)
Order has been faxed over to [facility]. 

## 2018-06-06 NOTE — Progress Notes (Signed)
Shacora Zynda 52 y.o. female DOB 1966/02/11 MRN [id number]       Nutrition  No diagnosis found. Past Medical History:  Diagnosis Date  . AKI (acute kidney injury) ([place])   . Anemia, iron deficiency   . Anxiety and depression 05/18/2015  . CAD (coronary artery disease), native coronary artery 01/11/2018   Non-STEMI with severe three-vessel disease noted July 2019  . Depression   . Diabetes mellitus without complication ([place])   . Diabetic gastroparesis ([place])   . Diabetic neuropathy, type I diabetes mellitus ([place]) 05/18/2015  . DKA (diabetic ketoacidoses) ([place]) 06/16/2017  . DKA, type 1 ([place]) 03/18/2016  . Essential hypertension   . Gastroparesis   . GERD (gastroesophageal reflux disease)   . HLD (hyperlipidemia)   . MDD (major depressive disorder), single episode, severe , no psychosis ([place])   . Non-ST elevation (NSTEMI) myocardial infarction ([place])   . S/P CABG x 3 01/14/2018  . Tardive dyskinesia   . Vitamin B12 deficiency 08/16/2015   Meds reviewed.    Current Outpatient Medications (Endocrine & Metabolic):  .  insulin aspart (NOVOLOG) 100 UNIT/ML injection, Inject 0-12 Units into the skin 4 (four) times daily -  before meals and at bedtime. Uses a sliding scale: 70-119 = 0 units, 120-150 = 2 units, 151-180 =  4 units, 181-210 = 5 units, 211-240 = 6 units, 241-270 = 8 units, 271-300 = 9 units, 301-330 = 10 units, 331-360 = 11 units, 361-400 = 12 units, greater than 400 notify MD. .  insulin detemir (LEVEMIR) 100 UNIT/ML injection, Inject 0.15 mLs (15 Units total) into the skin every morning.  Current Outpatient Medications (Cardiovascular):  .  atorvastatin (LIPITOR) 80 MG tablet, Take 1 tablet (80 mg total) by mouth daily. .  furosemide (LASIX) 40 MG tablet, Take 1 tablet (40 mg total) by mouth daily. (Patient taking differently: Take 40 mg by mouth daily as needed for fluid or edema. ) .  losartan (COZAAR) 25 MG tablet, Take 1 tablet (25 mg total) by mouth daily. .  metoprolol  tartrate (LOPRESSOR) 25 MG tablet, Take 0.5 tablets (12.5 mg total) by mouth 2 (two) times daily.  Current Outpatient Medications (Respiratory):  .  diphenhydrAMINE (BENADRYL) 25 mg capsule, Take 25 mg by mouth every 6 (six) hours as needed for allergies. .  promethazine (PHENERGAN) 12.5 MG tablet, Take 1 tablet (12.5 mg total) by mouth every 6 (six) hours as needed for nausea or vomiting.  Current Outpatient Medications (Analgesics):  .  acetaminophen (TYLENOL) 325 MG tablet, Take 2 tablets (650 mg total) by mouth every 6 (six) hours as needed for mild pain. Marland Kitchen  aspirin EC 81 MG tablet, Take 1 tablet (81 mg total) by mouth daily.   Current Outpatient Medications (Other):  .  Blood Glucose Monitoring Suppl (ONETOUCH VERIO) w/Device KIT, Use as directed .  busPIRone (BUSPAR) 10 MG tablet, Take 1 tablet (10 mg total) by mouth 3 (three) times daily. Marland Kitchen  docusate sodium (COLACE) 100 MG capsule, Take 1 capsule (100 mg total) by mouth daily as needed for mild constipation. Marland Kitchen  glucose blood (ACCU-CHEK AVIVA PLUS) test strip, Use as instructed to test blood sugar 5 times daily Dx E10.65 .  Insulin Syringe-Needle U-100 (BD INSULIN SYRINGE ULTRAFINE) 31G X 5/16" 0.5 ML MISC, 1 each by Does not apply route 4 (four) times daily. .  Lancets (ONETOUCH DELICA PLUS [id number]) MISC, 1 Units by Does not apply route as directed. .  loperamide (IMODIUM) 2 MG capsule, Take 1  capsule (2 mg total) by mouth as needed for diarrhea or loose stools. .  pantoprazole (PROTONIX) 40 MG tablet, Take 1 tablet (40 mg total) by mouth daily. .  potassium chloride (K-DUR) 10 MEQ tablet, Take 1 tablet (10 mEq total) by mouth daily. .  pregabalin (LYRICA) 150 MG capsule, Take 1 capsule (150 mg total) by mouth 2 (two) times daily. Marland Kitchen  pyridostigmine (MESTINON) 60 MG tablet, Take 60 mg by mouth 3 (three) times daily.   HT: Ht Readings from Last 1 Encounters:  06/03/18 5' 1"  (1.549 m)    WT: Wt Readings from Last 5 Encounters:   06/03/18 135 lb (61.2 kg)  04/30/18 127 lb 6.8 oz (57.8 kg)  04/28/18 134 lb (60.8 kg)  04/13/18 134 lb 7.7 oz (61 kg)  03/31/18 134 lb 7.7 oz (61 kg)     BMI = 25.52     06/03/18  Current tobacco use? No       Labs:  Lipid Panel     Component Value Date/Time   CHOL 121 01/07/2018 0510   TRIG 101 01/07/2018 0510   HDL 47 01/07/2018 0510   CHOLHDL 2.6 01/07/2018 0510   VLDL 20 01/07/2018 0510   LDLCALC 54 01/07/2018 0510    Lab Results  Component Value Date   HGBA1C 8.1 (A) 04/28/2018   CBG (last 3)  No results for input(s): GLUCAP in the last 72 hours.  Nutrition Diagnosis ? Food-and nutrition-related knowledge deficit related to lack of exposure to information as related to diagnosis of: ? CVD ? Type 1 Diabetes ? Overweight  related to excessive energy intake as evidenced by a BMI 25.52  Nutrition Goal(s):  ? To be determined  Plan:  Pt to attend nutrition classes ? Nutrition I ? Nutrition II ? Portion Distortion  ? Diabetes Blitz ? Diabetes Q & A Will provide client-centered nutrition education as part of interdisciplinary care.   Monitor and evaluate progress toward nutrition goal with team.  Laurina Bustle, MS, RD, LDN 06/06/2018 2:15 PM

## 2018-06-10 ENCOUNTER — Telehealth (HOSPITAL_COMMUNITY): Payer: Self-pay | Admitting: Pharmacist

## 2018-06-10 ENCOUNTER — Telehealth: Payer: Self-pay | Admitting: Cardiovascular Disease

## 2018-06-10 NOTE — Telephone Encounter (Signed)
New Message          *STAT* If patient is at the pharmacy, call can be transferred to refill team.   1. Which medications need to be refilled? (please list name of each medication and dose if known) Losartan 25 mg  2. Which pharmacy/location (including street and city if local pharmacy) is medication to be sent to?[facility] [address]  3. Do they need a 30 day or 90 day supply? 30   Patient wants generic if the medication is expensive.

## 2018-06-10 NOTE — Telephone Encounter (Signed)
Cardiac Rehab Medication Review by a Pharmacist  Does the patient  feel that his/her medications are working for him/her?  yes  Has the patient been experiencing any side effects to the medications prescribed?  no  Does the patient measure his/her own blood pressure or blood glucose at home?  no   Does the patient have any problems obtaining medications due to transportation or finances?   yes  Understanding of regimen: fair Understanding of indications: fair Potential of compliance: fair    Pharmacist comments: Patient was in a hurry to answer questions, also was agitated about cardiac rehab call because unsure of transportation and costs for medications.    Thank you for allowing pharmacy to be a part of this patient's care.  Tamela Gammon, PharmD 06/10/2018 2:22 PM PGY-1 Pharmacy Resident Direct Phone: [phone] Please check [email or web address] for unit-specific pharmacist phone numbers

## 2018-06-10 NOTE — Telephone Encounter (Signed)
Called pt to inform her that her medication was sent to her pharmacy. I called her pharmacy and they stated that they have this medication and will be getting it ready for pt to pick up.

## 2018-06-10 NOTE — Telephone Encounter (Signed)
pc to pt for pre Cardiac Rehab orientation nursing interview. Pt unsure of ability to participate in program due to transportation. Pt is moving to [facility] 07/04/2018 [place]. Pt is checking with [employer] for transportation opportunities.  Nursing interview completed. Pt will call [facility] to cancel if transportation concerns are not resolved.  Pt scheduled to begin [facility] on 06/23/2018 due to patient conflicts the week of 32/20/2542.  Pt verbalized understanding.  Andi Hence, RN, BSN Cardiac Pulmonary Rehab 06/10/18 11:41 AM

## 2018-06-12 ENCOUNTER — Ambulatory Visit: Payer: Medicare Other | Attending: Family Medicine | Admitting: Family Medicine

## 2018-06-12 ENCOUNTER — Encounter: Payer: Self-pay | Admitting: Family Medicine

## 2018-06-12 ENCOUNTER — Ambulatory Visit (HOSPITAL_BASED_OUTPATIENT_CLINIC_OR_DEPARTMENT_OTHER): Payer: Medicare Other | Admitting: Licensed Clinical Social Worker

## 2018-06-12 ENCOUNTER — Inpatient Hospital Stay (HOSPITAL_COMMUNITY)
Admission: RE | Admit: 2018-06-12 | Discharge: 2018-06-12 | Disposition: A | Discharge status: Home / Self Care | Payer: Self-pay | Source: Ambulatory Visit

## 2018-06-12 VITALS — BP 110/66 | HR 75 | Temp 97.5°F | Ht 61.0 in | Wt 141.0 lb

## 2018-06-12 DIAGNOSIS — K219 Gastro-esophageal reflux disease without esophagitis: Secondary | ICD-10-CM | POA: Diagnosis not present

## 2018-06-12 DIAGNOSIS — E1043 Type 1 diabetes mellitus with diabetic autonomic (poly)neuropathy: Secondary | ICD-10-CM | POA: Insufficient documentation

## 2018-06-12 DIAGNOSIS — Z748 Other problems related to care provider dependency: Secondary | ICD-10-CM

## 2018-06-12 DIAGNOSIS — Z794 Long term (current) use of insulin: Secondary | ICD-10-CM | POA: Diagnosis not present

## 2018-06-12 DIAGNOSIS — Z951 Presence of aortocoronary bypass graft: Secondary | ICD-10-CM | POA: Diagnosis not present

## 2018-06-12 DIAGNOSIS — I2510 Atherosclerotic heart disease of native coronary artery without angina pectoris: Secondary | ICD-10-CM

## 2018-06-12 DIAGNOSIS — K3184 Gastroparesis: Secondary | ICD-10-CM | POA: Diagnosis not present

## 2018-06-12 DIAGNOSIS — F419 Anxiety disorder, unspecified: Secondary | ICD-10-CM | POA: Diagnosis not present

## 2018-06-12 DIAGNOSIS — E1040 Type 1 diabetes mellitus with diabetic neuropathy, unspecified: Secondary | ICD-10-CM

## 2018-06-12 DIAGNOSIS — E1065 Type 1 diabetes mellitus with hyperglycemia: Secondary | ICD-10-CM | POA: Insufficient documentation

## 2018-06-12 DIAGNOSIS — X58XXXA Exposure to other specified factors, initial encounter: Secondary | ICD-10-CM | POA: Diagnosis not present

## 2018-06-12 DIAGNOSIS — I10 Essential (primary) hypertension: Secondary | ICD-10-CM | POA: Diagnosis not present

## 2018-06-12 DIAGNOSIS — F329 Major depressive disorder, single episode, unspecified: Secondary | ICD-10-CM | POA: Diagnosis not present

## 2018-06-12 DIAGNOSIS — I252 Old myocardial infarction: Secondary | ICD-10-CM | POA: Insufficient documentation

## 2018-06-12 DIAGNOSIS — F32A Depression, unspecified: Secondary | ICD-10-CM

## 2018-06-12 DIAGNOSIS — Z884 Allergy status to anesthetic agent status: Secondary | ICD-10-CM | POA: Diagnosis not present

## 2018-06-12 DIAGNOSIS — E785 Hyperlipidemia, unspecified: Secondary | ICD-10-CM | POA: Insufficient documentation

## 2018-06-12 DIAGNOSIS — S9032XA Contusion of left foot, initial encounter: Secondary | ICD-10-CM | POA: Diagnosis not present

## 2018-06-12 DIAGNOSIS — Z880 Allergy status to penicillin: Secondary | ICD-10-CM | POA: Insufficient documentation

## 2018-06-12 DIAGNOSIS — E1143 Type 2 diabetes mellitus with diabetic autonomic (poly)neuropathy: Secondary | ICD-10-CM

## 2018-06-12 DIAGNOSIS — Z79899 Other long term (current) drug therapy: Secondary | ICD-10-CM | POA: Diagnosis not present

## 2018-06-12 DIAGNOSIS — Z7982 Long term (current) use of aspirin: Secondary | ICD-10-CM | POA: Diagnosis not present

## 2018-06-12 LAB — GLUCOSE, POCT (MANUAL RESULT ENTRY): POC Glucose: 150 mg/dl — AB (ref 70–99)

## 2018-06-12 MED ORDER — METOPROLOL TARTRATE 25 MG PO TABS: 12.5000 mg | ORAL_TABLET | Freq: Two times a day (BID) | ORAL | 3 refills | Status: DC

## 2018-06-12 MED ORDER — PANTOPRAZOLE SODIUM 40 MG PO TBEC: 40.0000 mg | DELAYED_RELEASE_TABLET | Freq: Every day | ORAL | 3 refills | Status: DC

## 2018-06-12 MED ORDER — POTASSIUM CHLORIDE ER 10 MEQ PO TBCR: 10.0000 meq | EXTENDED_RELEASE_TABLET | Freq: Every day | ORAL | 3 refills | Status: DC

## 2018-06-12 MED ORDER — BUSPIRONE HCL 10 MG PO TABS: 10.0000 mg | ORAL_TABLET | Freq: Three times a day (TID) | ORAL | 3 refills | Status: DC

## 2018-06-12 MED ORDER — FUROSEMIDE 40 MG PO TABS: 40.0000 mg | ORAL_TABLET | Freq: Every day | ORAL | 3 refills | Status: DC | PRN

## 2018-06-12 NOTE — Progress Notes (Signed)
Patient states that heel on left foot is turning colors.

## 2018-06-12 NOTE — Patient Instructions (Signed)
Diabetes Mellitus and Nutrition, Adult  When you have diabetes (diabetes mellitus), it is very important to have healthy eating habits because your blood sugar (glucose) levels are greatly affected by what you eat and drink. Eating healthy foods in the appropriate amounts, at about the same times every day, can help you:  · Control your blood glucose.  · Lower your risk of heart disease.  · Improve your blood pressure.  · Reach or maintain a healthy weight.  Every person with diabetes is different, and each person has different needs for a meal plan. Your health care provider may recommend that you work with a diet and nutrition specialist (dietitian) to make a meal plan that is best for you. Your meal plan may vary depending on factors such as:  · The calories you need.  · The medicines you take.  · Your weight.  · Your blood glucose, blood pressure, and cholesterol levels.  · Your activity level.  · Other health conditions you have, such as heart or kidney disease.  How do carbohydrates affect me?  Carbohydrates, also called carbs, affect your blood glucose level more than any other type of food. Eating carbs naturally raises the amount of glucose in your blood. Carb counting is a method for keeping track of how many carbs you eat. Counting carbs is important to keep your blood glucose at a healthy level, especially if you use insulin or take certain oral diabetes medicines.  It is important to know how many carbs you can safely have in each meal. This is different for every person. Your dietitian can help you calculate how many carbs you should have at each meal and for each snack.  Foods that contain carbs include:  · Bread, cereal, rice, pasta, and crackers.  · Potatoes and corn.  · Peas, beans, and lentils.  · Milk and yogurt.  · Fruit and juice.  · Desserts, such as cakes, cookies, ice cream, and candy.  How does alcohol affect me?  Alcohol can cause a sudden decrease in blood glucose (hypoglycemia),  especially if you use insulin or take certain oral diabetes medicines. Hypoglycemia can be a life-threatening condition. Symptoms of hypoglycemia (sleepiness, dizziness, and confusion) are similar to symptoms of having too much alcohol.  If your health care provider says that alcohol is safe for you, follow these guidelines:  · Limit alcohol intake to no more than 1 drink per day for nonpregnant women and 2 drinks per day for men. One drink equals 12 oz of beer, 5 oz of wine, or 1½ oz of hard liquor.  · Do not drink on an empty stomach.  · Keep yourself hydrated with water, diet soda, or unsweetened iced tea.  · Keep in mind that regular soda, juice, and other mixers may contain a lot of sugar and must be counted as carbs.  What are tips for following this plan?    Reading food labels  · Start by checking the serving size on the "Nutrition Facts" label of packaged foods and drinks. The amount of calories, carbs, fats, and other nutrients listed on the label is based on one serving of the item. Many items contain more than one serving per package.  · Check the total grams (g) of carbs in one serving. You can calculate the number of servings of carbs in one serving by dividing the total carbs by 15. For example, if a food has 30 g of total carbs, it would be equal to 2   servings of carbs.  · Check the number of grams (g) of saturated and trans fats in one serving. Choose foods that have low or no amount of these fats.  · Check the number of milligrams (mg) of salt (sodium) in one serving. Most people should limit total sodium intake to less than 2,300 mg per day.  · Always check the nutrition information of foods labeled as "low-fat" or "nonfat". These foods may be higher in added sugar or refined carbs and should be avoided.  · Talk to your dietitian to identify your daily goals for nutrients listed on the label.  Shopping  · Avoid buying canned, premade, or processed foods. These foods tend to be high in fat, sodium,  and added sugar.  · Shop around the outside edge of the grocery store. This includes fresh fruits and vegetables, bulk grains, fresh meats, and fresh dairy.  Cooking  · Use low-heat cooking methods, such as baking, instead of high-heat cooking methods like deep frying.  · Cook using healthy oils, such as olive, canola, or sunflower oil.  · Avoid cooking with butter, cream, or high-fat meats.  Meal planning  · Eat meals and snacks regularly, preferably at the same times every day. Avoid going long periods of time without eating.  · Eat foods high in fiber, such as fresh fruits, vegetables, beans, and whole grains. Talk to your dietitian about how many servings of carbs you can eat at each meal.  · Eat 4-6 ounces (oz) of lean protein each day, such as lean meat, chicken, fish, eggs, or tofu. One oz of lean protein is equal to:  ? 1 oz of meat, chicken, or fish.  ? 1 egg.  ? ¼ cup of tofu.  · Eat some foods each day that contain healthy fats, such as avocado, nuts, seeds, and fish.  Lifestyle  · Check your blood glucose regularly.  · Exercise regularly as told by your health care provider. This may include:  ? 150 minutes of moderate-intensity or vigorous-intensity exercise each week. This could be brisk walking, biking, or water aerobics.  ? Stretching and doing strength exercises, such as yoga or weightlifting, at least 2 times a week.  · Take medicines as told by your health care provider.  · Do not use any products that contain nicotine or tobacco, such as cigarettes and e-cigarettes. If you need help quitting, ask your health care provider.  · Work with a counselor or diabetes educator to identify strategies to manage stress and any emotional and social challenges.  Questions to ask a health care provider  · Do I need to meet with a diabetes educator?  · Do I need to meet with a dietitian?  · What number can I call if I have questions?  · When are the best times to check my blood glucose?  Where to find more  information:  · American Diabetes Association: diabetes.org  · Academy of Nutrition and Dietetics: www.eatright.org  · National Institute of Diabetes and Digestive and Kidney Diseases (NIH): www.niddk.nih.gov  Summary  · A healthy meal plan will help you control your blood glucose and maintain a healthy lifestyle.  · Working with a diet and nutrition specialist (dietitian) can help you make a meal plan that is best for you.  · Keep in mind that carbohydrates (carbs) and alcohol have immediate effects on your blood glucose levels. It is important to count carbs and to use alcohol carefully.  This information is not intended to   replace advice given to you by your health care provider. Make sure you discuss any questions you have with your health care provider.  Document Released: 03/08/2005 Document Revised: 01/09/2017 Document Reviewed: 07/16/2016  Elsevier Interactive Patient Education © 2019 Elsevier Inc.

## 2018-06-12 NOTE — Progress Notes (Signed)
Subjective:  Patient ID: [name], female    DOB: [birth year]  Age: 52 y.o. MRN: [id number]  CC: Diabetes   HPI [name]  is a 52 year old female with a history of uncontrolled type 1 diabetes mellitus (A1c 8.2), diabetic neuropathy, diabetic gastroparesis, hypertension, anemia, anxiety and depression, multiple ED visits and hospitalizations for diabetic gastroparesis, CAD s/p CABG x3 (LIMA to LAD, SVG to PDA, SVG to OM) in 12/2017 here for a follow up visit. She denies shortness of breath, chest pain, pedal edema; was recently seen by her cardiologist 1 week ago and referred for cardiac rehab however she is concerned she will be moving to [place] and transportation will be a challenge given she will be unable to use [facility]. At her visit with cardiology, losartan was added to her regimen. Her diabetes mellitus is managed by endocrine, Dr. Dwyane Dee with her last visit in 04/28/2018 but after that she had a hospitalization at [facility] from 11/6 through 04/26/2018 for DKA trigerred by her gastroparesis.  She is doing well today from a diabetes standpoint denies hypoglycemia.  She has been unable to obtain Lyrica for management of her neuropathy due to the cost as she has no prescription drug coverage with her insurance.  We will speak with our pharmacy for options including patient assistance program. She noticed a dark discoloration on her left heel and is unsure of the duration; denies pain at the site or ulceration.  Past Medical History:  Diagnosis Date  . AKI (acute kidney injury) ([place])   . Anemia, iron deficiency   . Anxiety and depression 05/18/2015  . CAD (coronary artery disease), native coronary artery 01/11/2018   Non-STEMI with severe three-vessel disease noted July 2019  . Depression   . Diabetes mellitus without complication ([place])   . Diabetic gastroparesis ([place])   . Diabetic neuropathy, type I diabetes mellitus ([place]) 05/18/2015  . DKA (diabetic ketoacidoses)  ([place]) 06/16/2017  . DKA, type 1 ([place]) 03/18/2016  . Essential hypertension   . Gastroparesis   . GERD (gastroesophageal reflux disease)   . HLD (hyperlipidemia)   . MDD (major depressive disorder), single episode, severe , no psychosis ([place])   . Non-ST elevation (NSTEMI) myocardial infarction ([place])   . S/P CABG x 3 01/14/2018  . Tardive dyskinesia   . Vitamin B12 deficiency 08/16/2015    Past Surgical History:  Procedure Laterality Date  . CARDIAC SURGERY    . COLONOSCOPY  09/27/2014   at [facility]  . CORONARY ARTERY BYPASS GRAFT N/A 01/14/2018   Procedure: CORONARY ARTERY BYPASS GRAFTING (CABG)X3, RIGHT AND LEFT SAPHENOUS VEIN HARVEST, MAMMARY TAKE DOWN. MAMMARY TO LAD, SVG TO PD, SVG TO DISTAL CIRC.;  Surgeon: Grace Isaac, MD;  Location: [facility];  Service: Open Heart Surgery;  Laterality: N/A;  . ESOPHAGOGASTRODUODENOSCOPY  09/27/2014   at [facility], Dr Rolan Lipa. biospy neg for celiac, neg for H pylori.   . EYE SURGERY    . gailstones    . IR FLUORO GUIDE CV LINE RIGHT  02/01/2017  . IR FLUORO GUIDE CV LINE RIGHT  03/06/2017  . IR FLUORO GUIDE CV LINE RIGHT  03/25/2017  . IR GENERIC HISTORICAL  01/24/2016   IR FLUORO GUIDE CV LINE RIGHT 01/24/2016 Darrell K Allred, PA-C [facility]  . IR GENERIC HISTORICAL  01/24/2016   IR US GUIDE VASC ACCESS RIGHT 01/24/2016 Darrell K Allred, PA-C [facility]  . IR US GUIDE VASC ACCESS RIGHT  02/01/2017  . IR US GUIDE  VASC ACCESS RIGHT  03/06/2017  . IR US GUIDE VASC ACCESS RIGHT  03/25/2017  . LEFT HEART CATH AND CORONARY ANGIOGRAPHY N/A 01/07/2018   Procedure: LEFT HEART CATH AND CORONARY ANGIOGRAPHY;  Surgeon: Troy Sine, MD;  Location: [facility];  Service: Cardiovascular;  Laterality: N/A;  . POSTERIOR VITRECTOMY AND MEMBRANE PEEL-LEFT EYE  09/28/2002  . POSTERIOR VITRECTOMY AND MEMBRANE PEEL-RIGHT EYE  03/16/2002  . RETINAL DETACHMENT SURGERY    . TEE WITHOUT CARDIOVERSION N/A 01/14/2018   Procedure: TRANSESOPHAGEAL ECHOCARDIOGRAM (TEE);   Surgeon: Grace Isaac, MD;  Location: [facility];  Service: Open Heart Surgery;  Laterality: N/A;    Allergies  Allergen Reactions  . Anesthetics, Amide Nausea And Vomiting  . Penicillins Diarrhea, Nausea And Vomiting and Other (See Comments)    Has patient had a PCN reaction causing immediate rash, facial/tongue/throat swelling, SOB or lightheadedness with hypotension: Yes Has patient had a PCN reaction causing severe rash involving mucus membranes or skin necrosis: No Has patient had a PCN reaction that required hospitalization No Has patient had a PCN reaction occurring within the last 10 years: Yes  If all of the above answers are "NO", then may proceed with Cephalosporin use.   . Buprenorphine Hcl Rash  . Encainide Nausea And Vomiting  . Metoclopramide Other (See Comments)    Dystonia, muscle rigidity.  Patient states she is only allergic to the "pill form, not the IV"     Outpatient Medications Prior to Visit  Medication Sig Dispense Refill  . aspirin EC 81 MG tablet Take 1 tablet (81 mg total) by mouth daily.    Marland Kitchen atorvastatin (LIPITOR) 80 MG tablet Take 1 tablet (80 mg total) by mouth daily. 90 tablet 3  . Blood Glucose Monitoring Suppl (ONETOUCH VERIO) w/Device KIT Use as directed 1 kit 0  . diphenhydrAMINE (BENADRYL) 25 mg capsule Take 25 mg by mouth every 6 (six) hours as needed for allergies.    Marland Kitchen docusate sodium (COLACE) 100 MG capsule Take 1 capsule (100 mg total) by mouth daily as needed for mild constipation. 10 capsule 0  . glucose blood (ACCU-CHEK AVIVA PLUS) test strip Use as instructed to test blood sugar 5 times daily Dx E10.65 100 each 12  . insulin aspart (NOVOLOG) 100 UNIT/ML injection Inject 0-12 Units into the skin 4 (four) times daily -  before meals and at bedtime. Uses a sliding scale: 70-119 = 0 units, 120-150 = 2 units, 151-180 =  4 units, 181-210 = 5 units, 211-240 = 6 units, 241-270 = 8 units, 271-300 = 9 units, 301-330 = 10 units, 331-360 = 11 units,  361-400 = 12 units, greater than 400 notify MD.    . insulin detemir (LEVEMIR) 100 UNIT/ML injection Inject 0.15 mLs (15 Units total) into the skin every morning. 10 mL 1  . Insulin Syringe-Needle U-100 (BD INSULIN SYRINGE ULTRAFINE) 31G X 5/16" 0.5 ML MISC 1 each by Does not apply route 4 (four) times daily. 120 each 5  . Lancets (ONETOUCH DELICA PLUS EPPIRJ18A) MISC 1 Units by Does not apply route as directed. 100 each 11  . loperamide (IMODIUM) 2 MG capsule Take 1 capsule (2 mg total) by mouth as needed for diarrhea or loose stools. 30 capsule 0  . losartan (COZAAR) 25 MG tablet Take 1 tablet (25 mg total) by mouth daily. 30 tablet 11  . pregabalin (LYRICA) 150 MG capsule Take 1 capsule (150 mg total) by mouth 2 (two) times daily. 60 capsule  1  . promethazine (PHENERGAN) 12.5 MG tablet Take 1 tablet (12.5 mg total) by mouth every 6 (six) hours as needed for nausea or vomiting. 30 tablet 0  . pyridostigmine (MESTINON) 60 MG tablet Take 60 mg by mouth 3 (three) times daily.    . busPIRone (BUSPAR) 10 MG tablet Take 1 tablet (10 mg total) by mouth 3 (three) times daily. 90 tablet 3  . furosemide (LASIX) 40 MG tablet Take 1 tablet (40 mg total) by mouth daily. (Patient taking differently: Take 40 mg by mouth daily as needed for fluid or edema. ) 30 tablet 3  . metoprolol tartrate (LOPRESSOR) 25 MG tablet Take 0.5 tablets (12.5 mg total) by mouth 2 (two) times daily. 60 tablet 3  . pantoprazole (PROTONIX) 40 MG tablet Take 1 tablet (40 mg total) by mouth daily. 30 tablet 3  . potassium chloride (K-DUR) 10 MEQ tablet Take 1 tablet (10 mEq total) by mouth daily. 30 tablet 3  . acetaminophen (TYLENOL) 325 MG tablet Take 2 tablets (650 mg total) by mouth every 6 (six) hours as needed for mild pain. (Patient not taking: Reported on 06/10/2018)     No facility-administered medications prior to visit.     ROS Review of Systems  Constitutional: Negative for activity change, appetite change and fatigue.    HENT: Negative for congestion, sinus pressure and sore throat.   Eyes: Negative for visual disturbance.  Respiratory: Negative for cough, chest tightness, shortness of breath and wheezing.   Cardiovascular: Negative for chest pain and palpitations.  Gastrointestinal: Negative for abdominal distention, abdominal pain and constipation.  Endocrine: Negative for polydipsia.  Genitourinary: Negative for dysuria and frequency.  Musculoskeletal: Negative for arthralgias and back pain.  Skin: Negative for rash.       See hpi  Neurological: Negative for tremors, light-headedness and numbness.  Hematological: Does not bruise/bleed easily.  Psychiatric/Behavioral: Negative for agitation and behavioral problems.    Objective:  BP 110/66   Pulse 75   Temp (!) 97.5 F (36.4 C) (Oral)   Ht 5' 1"  (1.549 m)   Wt 141 lb (64 kg)   SpO2 99%   BMI 26.64 kg/m   BP/Weight 06/12/2018 06/03/2018 28/07/599  Systolic BP 561 537 943  Diastolic BP 66 60 54  Wt. (Lbs) 141 135 -  BMI 26.64 25.51 -      Physical Exam Constitutional:      Appearance: She is well-developed.  Cardiovascular:     Rate and Rhythm: Normal rate.     Heart sounds: Normal heart sounds. No murmur.  Pulmonary:     Effort: Pulmonary effort is normal.     Breath sounds: Normal breath sounds. No wheezing or rales.  Chest:     Chest wall: No tenderness.  Abdominal:     General: Bowel sounds are normal. There is no distension.     Palpations: Abdomen is soft. There is no mass.     Tenderness: There is no abdominal tenderness.  Musculoskeletal: Normal range of motion.  Skin:    Comments: Bruise on left posterior heel measuring 4 cm in diameter  Neurological:     Mental Status: She is alert and oriented to person, place, and time.  Psychiatric:        Mood and Affect: Mood normal.        Behavior: Behavior normal.     Lab Results  Component Value Date   HGBA1C 8.1 (A) 04/28/2018    Assessment & Plan:   1.  Diabetic neuropathy, type I diabetes mellitus ([place]) Has diabetic gastroparesis as well Uncontrolled with A1c of 8.1 NovoLog was recently added to her regimen by her endocrinologist Management as per endocrine - POCT glucose (manual entry)  2. Anxiety and depression Controlled - busPIRone (BUSPAR) 10 MG tablet; Take 1 tablet (10 mg total) by mouth 3 (three) times daily.  Dispense: 90 tablet; Refill: 3  3. CAD ([place]) Status post CABG x3 EF 55% from 12/2017 Euvolemic - furosemide (LASIX) 40 MG tablet; Take 1 tablet (40 mg total) by mouth daily as needed for fluid or edema.  Dispense: 30 tablet; Refill: 3 - potassium chloride (K-DUR) 10 MEQ tablet; Take 1 tablet (10 mEq total) by mouth daily.  Dispense: 30 tablet; Refill: 3  4. Essential hypertension Controlled Low-sodium, DASH diet - metoprolol tartrate (LOPRESSOR) 25 MG tablet; Take 0.5 tablets (12.5 mg total) by mouth 2 (two) times daily.  Dispense: 60 tablet; Refill: 3  5. Diabetic gastroparesis ([facility]) Stable - pantoprazole (PROTONIX) 40 MG tablet; Take 1 tablet (40 mg total) by mouth daily.  Dispense: 30 tablet; Refill: 3  6. S/P CABG x 3 See #3 above  7. Contusion of left heel, initial encounter No breakdown of skin Advised to avoid bearing pressure on her left heel to prevent development of pressure ulcer Diabetic foot care discussed - Ambulatory referral to Podiatry  LCSW called in to discuss options for transportation to cardiac rehab Meds ordered this encounter  Medications  . busPIRone (BUSPAR) 10 MG tablet    Sig: Take 1 tablet (10 mg total) by mouth 3 (three) times daily.    Dispense:  90 tablet    Refill:  3  . furosemide (LASIX) 40 MG tablet    Sig: Take 1 tablet (40 mg total) by mouth daily as needed for fluid or edema.    Dispense:  30 tablet    Refill:  3  . metoprolol tartrate (LOPRESSOR) 25 MG tablet    Sig: Take 0.5 tablets (12.5 mg total) by mouth 2 (two) times daily.    Dispense:  60 tablet    Refill:   3  . pantoprazole (PROTONIX) 40 MG tablet    Sig: Take 1 tablet (40 mg total) by mouth daily.    Dispense:  30 tablet    Refill:  3  . potassium chloride (K-DUR) 10 MEQ tablet    Sig: Take 1 tablet (10 mEq total) by mouth daily.    Dispense:  30 tablet    Refill:  3    Follow-up: Return in about 1 month (around 07/13/2018) for complete physical.   [name] MD

## 2018-06-13 ENCOUNTER — Other Ambulatory Visit: Payer: Self-pay | Admitting: Family Medicine

## 2018-06-13 MED ORDER — PREGABALIN 150 MG PO CAPS: 150.0000 mg | ORAL_CAPSULE | Freq: Two times a day (BID) | ORAL | 1 refills | Status: DC

## 2018-06-16 ENCOUNTER — Ambulatory Visit (HOSPITAL_COMMUNITY): Payer: Self-pay

## 2018-06-16 NOTE — BH Specialist Note (Signed)
Integrated Behavioral Health Follow Up Visit  MRN: [id number] Name: [name]  Number of [facility] Clinician visits: 2/6 Session Start time: 12:00 PM  Session End time: 12:10 PM Total time: 10 minutes  Type of Service: [place] Interpretor:No. Interpretor Name and Language: N/A  SUBJECTIVE: [name] is a 52 y.o. female accompanied by self Patient was referred by Dr. Margarita Rana for community resources. Patient reports the following symptoms/concerns: Pt is in need of transportation resources Duration of problem: 1 week; Severity of problem: na  OBJECTIVE: Mood: Pleasant and Affect: Appropriate Risk of harm to self or others: No plan to harm self or others  LIFE CONTEXT: Family and Social: Pt receives strong family support School/Work: Pt receives income Self-Care: Pt enjoys spending time with family and shopping Life Changes: Pt plans to move in with sister. [facility] will be unable to pick pt up at new residence; therefore, transportation resources are needed  GOALS ADDRESSED: Patient will: 1.  Reduce symptoms of: anxiety  2.  Increase knowledge and/or ability of: community resources  3.  Demonstrate ability to: Increase motivation to adhere to plan of care  INTERVENTIONS: Interventions utilized:  Solution-Focused Strategies Standardized Assessments completed: GAD-7 and PHQ 2&9  ASSESSMENT: Patient currently experiencing stress triggered by upcoming move to sister's residence. Pt utilizes [facility]; however, future residence is not on route. LCSWA discussed various transportation resources.   PLAN: 1. Follow up with behavioral health clinician on : Pt was encouraged to contact LCSWA if symptoms worsen or fail to improve to schedule behavioral appointments at [facility]. 2. Behavioral recommendations: LCSWA recommends that pt utilize provided resources.  3. Referral(s): Community Resources:  Transportation 4. "From scale of  1-10, how likely are you to follow plan?":   Rebekah Chesterfield, LCSW 06/16/18 5:04 PM

## 2018-06-20 ENCOUNTER — Ambulatory Visit (HOSPITAL_COMMUNITY): Payer: Self-pay

## 2018-06-23 ENCOUNTER — Ambulatory Visit (HOSPITAL_COMMUNITY): Payer: Self-pay

## 2018-06-24 ENCOUNTER — Other Ambulatory Visit: Payer: Self-pay

## 2018-06-24 ENCOUNTER — Telehealth (HOSPITAL_COMMUNITY): Payer: Self-pay

## 2018-06-24 ENCOUNTER — Encounter (HOSPITAL_COMMUNITY): Payer: Self-pay

## 2018-06-24 NOTE — Telephone Encounter (Signed)
Attempted to call patient in regards to Cardiac Rehab - unable to leave voicemail, mailbox full.  Mailed letter

## 2018-06-25 DIAGNOSIS — Z951 Presence of aortocoronary bypass graft: Secondary | ICD-10-CM | POA: Insufficient documentation

## 2018-06-27 ENCOUNTER — Ambulatory Visit (HOSPITAL_COMMUNITY): Payer: Self-pay

## 2018-06-30 ENCOUNTER — Ambulatory Visit (HOSPITAL_COMMUNITY): Payer: Self-pay

## 2018-06-30 ENCOUNTER — Ambulatory Visit: Payer: Medicare Other | Admitting: Podiatry

## 2018-07-02 ENCOUNTER — Ambulatory Visit (HOSPITAL_COMMUNITY): Payer: Self-pay

## 2018-07-04 ENCOUNTER — Ambulatory Visit (HOSPITAL_COMMUNITY): Payer: Self-pay

## 2018-07-07 ENCOUNTER — Ambulatory Visit (HOSPITAL_COMMUNITY): Payer: Self-pay

## 2018-07-08 ENCOUNTER — Ambulatory Visit: Payer: Medicare Other | Admitting: Podiatry

## 2018-07-08 ENCOUNTER — Encounter: Payer: Self-pay | Admitting: Podiatry

## 2018-07-08 ENCOUNTER — Ambulatory Visit (INDEPENDENT_AMBULATORY_CARE_PROVIDER_SITE_OTHER): Payer: Medicare HMO

## 2018-07-08 VITALS — BP 116/61 | HR 73 | Resp 16

## 2018-07-08 DIAGNOSIS — E1042 Type 1 diabetes mellitus with diabetic polyneuropathy: Secondary | ICD-10-CM | POA: Diagnosis not present

## 2018-07-08 DIAGNOSIS — M79675 Pain in left toe(s): Secondary | ICD-10-CM

## 2018-07-08 DIAGNOSIS — B351 Tinea unguium: Secondary | ICD-10-CM

## 2018-07-08 DIAGNOSIS — S9032XA Contusion of left foot, initial encounter: Secondary | ICD-10-CM | POA: Diagnosis not present

## 2018-07-08 DIAGNOSIS — Q828 Other specified congenital malformations of skin: Secondary | ICD-10-CM | POA: Diagnosis not present

## 2018-07-08 DIAGNOSIS — T148XXA Other injury of unspecified body region, initial encounter: Secondary | ICD-10-CM | POA: Diagnosis not present

## 2018-07-08 DIAGNOSIS — M79674 Pain in right toe(s): Secondary | ICD-10-CM | POA: Diagnosis not present

## 2018-07-08 MED ORDER — AMMONIUM LACTATE 12 % EX CREA: TOPICAL_CREAM | CUTANEOUS | 0 refills | Status: DC | PRN

## 2018-07-08 NOTE — Progress Notes (Signed)
Subjective:    Patient ID: [name], female    DOB: 1966-02-25, 53 y.o.   MRN: [id number]  HPI 53 year old female presents the office today for concerns of possible bruising to the back of the left heel is been ongoing last 2 to 3 weeks.  She does not remember injuring her foot overall she states that the color is getting much better.  She denies any swelling or redness.  She also states her nails are thickened elongated and causing pain she was have the nails trimmed.  Denies any redness or drainage or any swelling to the toenail sites.  She is diabetic her last A1c was 8.1.  She has no other concerns.   Review of Systems  All other systems reviewed and are negative.  Past Medical History:  Diagnosis Date  . AKI (acute kidney injury) ([place])   . Anemia, iron deficiency   . Anxiety and depression 05/18/2015  . CAD (coronary artery disease), native coronary artery 01/11/2018   Non-STEMI with severe three-vessel disease noted July 2019  . Depression   . Diabetes mellitus without complication ([place])   . Diabetic gastroparesis ([place])   . Diabetic neuropathy, type I diabetes mellitus ([place]) 05/18/2015  . DKA (diabetic ketoacidoses) ([place]) 06/16/2017  . DKA, type 1 ([place]) 03/18/2016  . Essential hypertension   . Gastroparesis   . GERD (gastroesophageal reflux disease)   . HLD (hyperlipidemia)   . MDD (major depressive disorder), single episode, severe , no psychosis ([place])   . Non-ST elevation (NSTEMI) myocardial infarction ([place])   . S/P CABG x 3 01/14/2018  . Tardive dyskinesia   . Vitamin B12 deficiency 08/16/2015    Past Surgical History:  Procedure Laterality Date  . CARDIAC SURGERY    . COLONOSCOPY  09/27/2014   at [facility]  . CORONARY ARTERY BYPASS GRAFT N/A 01/14/2018   Procedure: CORONARY ARTERY BYPASS GRAFTING (CABG)X3, RIGHT AND LEFT SAPHENOUS VEIN HARVEST, MAMMARY TAKE DOWN. MAMMARY TO LAD, SVG TO PD, SVG TO DISTAL CIRC.;  Surgeon: Grace Isaac, MD;  Location: [facility];   Service: Open Heart Surgery;  Laterality: N/A;  . ESOPHAGOGASTRODUODENOSCOPY  09/27/2014   at [facility], Dr Rolan Lipa. biospy neg for celiac, neg for H pylori.   . EYE SURGERY    . gailstones    . IR FLUORO GUIDE CV LINE RIGHT  02/01/2017  . IR FLUORO GUIDE CV LINE RIGHT  03/06/2017  . IR FLUORO GUIDE CV LINE RIGHT  03/25/2017  . IR GENERIC HISTORICAL  01/24/2016   IR FLUORO GUIDE CV LINE RIGHT 01/24/2016 Darrell K Allred, PA-C [facility]  . IR GENERIC HISTORICAL  01/24/2016   IR US GUIDE VASC ACCESS RIGHT 01/24/2016 Darrell K Allred, PA-C [facility]  . IR US GUIDE VASC ACCESS RIGHT  02/01/2017  . IR US GUIDE VASC ACCESS RIGHT  03/06/2017  . IR US GUIDE VASC ACCESS RIGHT  03/25/2017  . LEFT HEART CATH AND CORONARY ANGIOGRAPHY N/A 01/07/2018   Procedure: LEFT HEART CATH AND CORONARY ANGIOGRAPHY;  Surgeon: Troy Sine, MD;  Location: [facility];  Service: Cardiovascular;  Laterality: N/A;  . POSTERIOR VITRECTOMY AND MEMBRANE PEEL-LEFT EYE  09/28/2002  . POSTERIOR VITRECTOMY AND MEMBRANE PEEL-RIGHT EYE  03/16/2002  . RETINAL DETACHMENT SURGERY    . TEE WITHOUT CARDIOVERSION N/A 01/14/2018   Procedure: TRANSESOPHAGEAL ECHOCARDIOGRAM (TEE);  Surgeon: Grace Isaac, MD;  Location: [facility];  Service: Open Heart Surgery;  Laterality: N/A;     Current Outpatient  Medications:  .  acetaminophen (TYLENOL) 325 MG tablet, Take 2 tablets (650 mg total) by mouth every 6 (six) hours as needed for mild pain., Disp: , Rfl:  .  aspirin EC 81 MG tablet, Take 1 tablet (81 mg total) by mouth daily., Disp: , Rfl:  .  atorvastatin (LIPITOR) 80 MG tablet, Take 1 tablet (80 mg total) by mouth daily., Disp: 90 tablet, Rfl: 3 .  Blood Glucose Monitoring Suppl (ONETOUCH VERIO) w/Device KIT, Use as directed, Disp: 1 kit, Rfl: 0 .  busPIRone (BUSPAR) 10 MG tablet, Take 1 tablet (10 mg total) by mouth 3 (three) times daily., Disp: 90 tablet, Rfl: 3 .  diphenhydrAMINE (BENADRYL) 25 mg capsule, Take 25 mg by mouth  every 6 (six) hours as needed for allergies., Disp: , Rfl:  .  docusate sodium (COLACE) 100 MG capsule, Take 1 capsule (100 mg total) by mouth daily as needed for mild constipation., Disp: 10 capsule, Rfl: 0 .  furosemide (LASIX) 40 MG tablet, Take 1 tablet (40 mg total) by mouth daily as needed for fluid or edema., Disp: 30 tablet, Rfl: 3 .  glucose blood (ACCU-CHEK AVIVA PLUS) test strip, Use as instructed to test blood sugar 5 times daily Dx E10.65, Disp: 100 each, Rfl: 12 .  insulin aspart (NOVOLOG) 100 UNIT/ML injection, Inject 0-12 Units into the skin 4 (four) times daily -  before meals and at bedtime. Uses a sliding scale: 70-119 = 0 units, 120-150 = 2 units, 151-180 =  4 units, 181-210 = 5 units, 211-240 = 6 units, 241-270 = 8 units, 271-300 = 9 units, 301-330 = 10 units, 331-360 = 11 units, 361-400 = 12 units, greater than 400 notify MD., Disp: , Rfl:  .  insulin detemir (LEVEMIR) 100 UNIT/ML injection, Inject 0.15 mLs (15 Units total) into the skin every morning., Disp: 10 mL, Rfl: 1 .  Insulin Syringe-Needle U-100 (BD INSULIN SYRINGE ULTRAFINE) 31G X 5/16" 0.5 ML MISC, 1 each by Does not apply route 4 (four) times daily., Disp: 120 each, Rfl: 5 .  Lancets (ONETOUCH DELICA PLUS [id number]) MISC, 1 Units by Does not apply route as directed., Disp: 100 each, Rfl: 11 .  loperamide (IMODIUM) 2 MG capsule, Take 1 capsule (2 mg total) by mouth as needed for diarrhea or loose stools., Disp: 30 capsule, Rfl: 0 .  losartan (COZAAR) 25 MG tablet, Take 1 tablet (25 mg total) by mouth daily., Disp: 30 tablet, Rfl: 11 .  metoprolol tartrate (LOPRESSOR) 25 MG tablet, Take 0.5 tablets (12.5 mg total) by mouth 2 (two) times daily., Disp: 60 tablet, Rfl: 3 .  pantoprazole (PROTONIX) 40 MG tablet, Take 1 tablet (40 mg total) by mouth daily., Disp: 30 tablet, Rfl: 3 .  potassium chloride (K-DUR) 10 MEQ tablet, Take 1 tablet (10 mEq total) by mouth daily., Disp: 30 tablet, Rfl: 3 .  pregabalin (LYRICA) 150 MG  capsule, Take 1 capsule (150 mg total) by mouth 2 (two) times daily., Disp: 180 capsule, Rfl: 1 .  promethazine (PHENERGAN) 12.5 MG tablet, Take 1 tablet (12.5 mg total) by mouth every 6 (six) hours as needed for nausea or vomiting., Disp: 30 tablet, Rfl: 0 .  pyridostigmine (MESTINON) 60 MG tablet, Take 60 mg by mouth 3 (three) times daily., Disp: , Rfl:  .  ammonium lactate (AMLACTIN) 12 % cream, Apply topically as needed for dry skin., Disp: 385 g, Rfl: 0  Allergies  Allergen Reactions  . Anesthetics, Amide Nausea And Vomiting  . Penicillins  Diarrhea, Nausea And Vomiting and Other (See Comments)    Has patient had a PCN reaction causing immediate rash, facial/tongue/throat swelling, SOB or lightheadedness with hypotension: Yes Has patient had a PCN reaction causing severe rash involving mucus membranes or skin necrosis: No Has patient had a PCN reaction that required hospitalization No Has patient had a PCN reaction occurring within the last 10 years: Yes  If all of the above answers are "NO", then may proceed with Cephalosporin use.   . Buprenorphine Hcl Rash  . Encainide Nausea And Vomiting  . Metoclopramide Other (See Comments)    Dystonia, muscle rigidity.  Patient states she is only allergic to the "pill form, not the IV"        Objective:   Physical Exam  General: AAO x3, NAD  Dermatological: Nails are hypertrophic, dystrophic, brittle, discolored, elongated 10. No surrounding redness or drainage. Tenderness nails 1-5 bilaterally.  The posterior aspect of the left heel is a small hyperkeratotic lesion.  Upon debridement there is no underlying ulceration drainage or any signs of infection.  This appears to be the area of where she had "bruising".  There is no skin breakdown today.  No open lesions or pre-ulcerative lesions are identified today.   Vascular: Dorsalis Pedis artery and Posterior Tibial artery pedal pulses are 2/4 bilateral with immedate capillary fill time. There  is no pain with calf compression, swelling, warmth, erythema.   Neruologic: Grossly intact via light touch bilateral. . Protective threshold with Semmes Wienstein monofilament intact to all pedal sites bilateral.   Musculoskeletal: No gross boney pedal deformities bilateral. No pain, crepitus, or limitation noted with foot and ankle range of motion bilateral. Muscular strength 5/5 in all groups tested bilateral.      Assessment & Plan:  53 year old female with symptomatic onychomycosis, pre-ulcerative callus left heel -Treatment options discussed including all alternatives, risks, and complications -X-rays were obtained reviewed.  No evidence of acute fracture or stress fracture. -Etiology of symptoms were discussed -Nails debrided 10 without complications or bleeding. -Debrided the hyperkeratotic lesion left posterior heel and complications or bleeding.  She likely had bruising from pressure but this appears to be resolved.  We will continue to monitor.Recommend moisturizer daily. -Daily foot inspection -Follow-up in 3 months or sooner if any problems arise. In the meantime, encouraged to call the office with any questions, concerns, change in symptoms.   Celesta Gentile, DPM

## 2018-07-09 ENCOUNTER — Ambulatory Visit (HOSPITAL_COMMUNITY): Payer: Self-pay

## 2018-07-11 ENCOUNTER — Ambulatory Visit (HOSPITAL_COMMUNITY): Payer: Self-pay

## 2018-07-14 ENCOUNTER — Telehealth (HOSPITAL_COMMUNITY): Payer: Self-pay

## 2018-07-14 ENCOUNTER — Ambulatory Visit (HOSPITAL_COMMUNITY): Payer: Self-pay

## 2018-07-14 NOTE — Telephone Encounter (Signed)
Attempted to call pt a 2nd time- LM ON VM

## 2018-07-16 ENCOUNTER — Telehealth: Payer: Self-pay

## 2018-07-16 ENCOUNTER — Ambulatory Visit (HOSPITAL_COMMUNITY): Payer: Self-pay

## 2018-07-16 ENCOUNTER — Ambulatory Visit: Payer: Self-pay | Attending: Family Medicine | Admitting: Family Medicine

## 2018-07-16 ENCOUNTER — Encounter: Payer: Self-pay | Admitting: Family Medicine

## 2018-07-16 VITALS — BP 128/59 | HR 87 | Temp 97.7°F | Ht 61.0 in | Wt 147.2 lb

## 2018-07-16 DIAGNOSIS — K219 Gastro-esophageal reflux disease without esophagitis: Secondary | ICD-10-CM | POA: Insufficient documentation

## 2018-07-16 DIAGNOSIS — Z8249 Family history of ischemic heart disease and other diseases of the circulatory system: Secondary | ICD-10-CM | POA: Insufficient documentation

## 2018-07-16 DIAGNOSIS — F329 Major depressive disorder, single episode, unspecified: Secondary | ICD-10-CM | POA: Insufficient documentation

## 2018-07-16 DIAGNOSIS — E785 Hyperlipidemia, unspecified: Secondary | ICD-10-CM | POA: Insufficient documentation

## 2018-07-16 DIAGNOSIS — I10 Essential (primary) hypertension: Secondary | ICD-10-CM | POA: Insufficient documentation

## 2018-07-16 DIAGNOSIS — Z794 Long term (current) use of insulin: Secondary | ICD-10-CM | POA: Insufficient documentation

## 2018-07-16 DIAGNOSIS — Z79899 Other long term (current) drug therapy: Secondary | ICD-10-CM | POA: Insufficient documentation

## 2018-07-16 DIAGNOSIS — Z951 Presence of aortocoronary bypass graft: Secondary | ICD-10-CM | POA: Insufficient documentation

## 2018-07-16 DIAGNOSIS — I252 Old myocardial infarction: Secondary | ICD-10-CM | POA: Insufficient documentation

## 2018-07-16 DIAGNOSIS — E1010 Type 1 diabetes mellitus with ketoacidosis without coma: Secondary | ICD-10-CM | POA: Insufficient documentation

## 2018-07-16 DIAGNOSIS — E1143 Type 2 diabetes mellitus with diabetic autonomic (poly)neuropathy: Secondary | ICD-10-CM

## 2018-07-16 DIAGNOSIS — Z7982 Long term (current) use of aspirin: Secondary | ICD-10-CM | POA: Insufficient documentation

## 2018-07-16 DIAGNOSIS — Z880 Allergy status to penicillin: Secondary | ICD-10-CM | POA: Insufficient documentation

## 2018-07-16 DIAGNOSIS — E1065 Type 1 diabetes mellitus with hyperglycemia: Secondary | ICD-10-CM

## 2018-07-16 DIAGNOSIS — Z833 Family history of diabetes mellitus: Secondary | ICD-10-CM | POA: Insufficient documentation

## 2018-07-16 DIAGNOSIS — K3184 Gastroparesis: Secondary | ICD-10-CM

## 2018-07-16 DIAGNOSIS — I2510 Atherosclerotic heart disease of native coronary artery without angina pectoris: Secondary | ICD-10-CM | POA: Insufficient documentation

## 2018-07-16 DIAGNOSIS — Z885 Allergy status to narcotic agent status: Secondary | ICD-10-CM | POA: Insufficient documentation

## 2018-07-16 DIAGNOSIS — Z884 Allergy status to anesthetic agent status: Secondary | ICD-10-CM | POA: Insufficient documentation

## 2018-07-16 DIAGNOSIS — F419 Anxiety disorder, unspecified: Secondary | ICD-10-CM

## 2018-07-16 DIAGNOSIS — E1040 Type 1 diabetes mellitus with diabetic neuropathy, unspecified: Secondary | ICD-10-CM

## 2018-07-16 DIAGNOSIS — F32A Depression, unspecified: Secondary | ICD-10-CM

## 2018-07-16 DIAGNOSIS — E1043 Type 1 diabetes mellitus with diabetic autonomic (poly)neuropathy: Secondary | ICD-10-CM | POA: Insufficient documentation

## 2018-07-16 LAB — GLUCOSE, POCT (MANUAL RESULT ENTRY): POC Glucose: 181 mg/dl — AB (ref 70–99)

## 2018-07-16 MED ORDER — INSULIN DETEMIR 100 UNIT/ML ~~LOC~~ SOLN: 15.0000 [IU] | SUBCUTANEOUS | 1 refills | Status: DC

## 2018-07-16 MED ORDER — INSULIN ASPART 100 UNIT/ML ~~LOC~~ SOLN: 0.0000 [IU] | Freq: Three times a day (TID) | SUBCUTANEOUS | 0 refills | Status: DC

## 2018-07-16 NOTE — Progress Notes (Signed)
Established Patient Office Visit  Subjective:  Patient ID: [name], female    DOB: 1965/10/02  Age: 53 y.o. MRN: [id number]  CC:  Chief Complaint  Patient presents with  . Annual Exam    HPI [name] is a 53 year old female with a past medical history of DM Type 1, Diabetic Neuropathy, Diabetic Gastroparesis, Hypertension, Anemia, Anxiety and Depression. She has had multiple ED hospitalizations for diabetic gastroparesis, CAD s/p CABG x3 in 2019. She presents to clinic today for annual physical exam with pap smear; she is currently on her menstrual cycle.  . DM Type 1 - Dm is managed by endocrinologist Dr.Kumar. Pt says gastroproesis has improved tremendously and she now able to tolerate eating. Denies signs of hypoglycemia and takes a snack at night to prevent hypoglycemic episodes. Her last eye exam was two years ago. She last saw the podiatrist 2 years ago. Compliant with insulin regimen. Not compliant with diabetic diet. Not physically active. Fasting glucose 246. Requesting refills for her insulins. . Diabetic neuropathy- Takes Lyrica, she has been compliant with taking. Says the lyricia takes the pain away. . Diabetic gastroparesis - Recently hospitalized at [facility] on 04/26/2018. Gastroparesis has resolved. Marland Kitchen HTN -  Recently seen by cardiologist in Dec 2019 and referred for cardiac rehab and losartan was added to the medication regimen, will be moving to [place]. But she not been able to go to due to transportation issues. Denies slurred speech, facial dropping, paralysis, SOB, chest pain and pedal edema. Says she is complaint with her medication regimen. . Anxiety/ Depression - has not been to the psychiatric since initial visit due to recent hospitalization and transportation difficulty. She plans on setting up another appointment soon. Denies suicidual or homicidal thoughts.    Past Medical History:  Diagnosis Date  . AKI (acute kidney injury) ([place])   . Anemia,  iron deficiency   . Anxiety and depression 05/18/2015  . CAD (coronary artery disease), native coronary artery 01/11/2018   Non-STEMI with severe three-vessel disease noted July 2019  . Depression   . Diabetes mellitus without complication ([place])   . Diabetic gastroparesis ([place])   . Diabetic neuropathy, type I diabetes mellitus ([place]) 05/18/2015  . DKA (diabetic ketoacidoses) ([place]) 06/16/2017  . DKA, type 1 ([place]) 03/18/2016  . Essential hypertension   . Gastroparesis   . GERD (gastroesophageal reflux disease)   . HLD (hyperlipidemia)   . MDD (major depressive disorder), single episode, severe , no psychosis ([place])   . Non-ST elevation (NSTEMI) myocardial infarction ([place])   . S/P CABG x 3 01/14/2018  . Tardive dyskinesia   . Vitamin B12 deficiency 08/16/2015    Past Surgical History:  Procedure Laterality Date  . CARDIAC SURGERY    . COLONOSCOPY  09/27/2014   at [facility]  . CORONARY ARTERY BYPASS GRAFT N/A 01/14/2018   Procedure: CORONARY ARTERY BYPASS GRAFTING (CABG)X3, RIGHT AND LEFT SAPHENOUS VEIN HARVEST, MAMMARY TAKE DOWN. MAMMARY TO LAD, SVG TO PD, SVG TO DISTAL CIRC.;  Surgeon: Grace Isaac, MD;  Location: [place];  Service: Open Heart Surgery;  Laterality: N/A;  . ESOPHAGOGASTRODUODENOSCOPY  09/27/2014   at [facility], Dr Rolan Lipa. biospy neg for celiac, neg for H pylori.   . EYE SURGERY    . gailstones    . IR FLUORO GUIDE CV LINE RIGHT  02/01/2017  . IR FLUORO GUIDE CV LINE RIGHT  03/06/2017  . IR FLUORO GUIDE CV LINE RIGHT  03/25/2017  . IR GENERIC  HISTORICAL  01/24/2016   IR FLUORO GUIDE CV LINE RIGHT 01/24/2016 Darrell K Allred, PA-C [facility]  . IR GENERIC HISTORICAL  01/24/2016   IR US GUIDE VASC ACCESS RIGHT 01/24/2016 Darrell K Allred, PA-C [facility]  . IR US GUIDE VASC ACCESS RIGHT  02/01/2017  . IR US GUIDE VASC ACCESS RIGHT  03/06/2017  . IR US GUIDE VASC ACCESS RIGHT  03/25/2017  . LEFT HEART CATH AND CORONARY ANGIOGRAPHY N/A 01/07/2018   Procedure: LEFT HEART CATH AND  CORONARY ANGIOGRAPHY;  Surgeon: Troy Sine, MD;  Location: [place] [facility];  Service: Cardiovascular;  Laterality: N/A;  . POSTERIOR VITRECTOMY AND MEMBRANE PEEL-LEFT EYE  09/28/2002  . POSTERIOR VITRECTOMY AND MEMBRANE PEEL-RIGHT EYE  03/16/2002  . RETINAL DETACHMENT SURGERY    . TEE WITHOUT CARDIOVERSION N/A 01/14/2018   Procedure: TRANSESOPHAGEAL ECHOCARDIOGRAM (TEE);  Surgeon: Grace Isaac, MD;  Location: [place];  Service: Open Heart Surgery;  Laterality: N/A;    Family History  Problem Relation Age of Onset  . Cystic fibrosis Mother   . Hypertension Father   . Diabetes Brother   . Hypertension Maternal Grandmother     Social History   Socioeconomic History  . Marital status: Single    Spouse name: Not on file  . Number of children: 1  . Years of education: Not on file  . Highest education level: Not on file  Occupational History  . Occupation: Unemployed  Social Needs  . Financial resource strain: Patient refused  . Food insecurity:    Worry: Not on file    Inability: Not on file  . Transportation needs:    Medical: Patient refused    Non-medical: Patient refused  Tobacco Use  . Smoking status: Never Smoker  . Smokeless tobacco: Never Used  Substance and Sexual Activity  . Alcohol use: No  . Drug use: No  . Sexual activity: Not Currently  Lifestyle  . Physical activity:    Days per week: Patient refused    Minutes per session: Not on file  . Stress: Patient refused  Relationships  . Social connections:    Talks on phone: Not on file    Gets together: Not on file    Attends religious service: Not on file    Active member of club or organization: Not on file    Attends meetings of clubs or organizations: Not on file    Relationship status: Not on file  . Intimate partner violence:    Fear of current or ex partner: Not on file    Emotionally abused: Not on file    Physically abused: Not on file    Forced sexual activity: Not on file  Other Topics  Concern  . Not on file  Social History Narrative  . Not on file    Outpatient Medications Prior to Visit  Medication Sig Dispense Refill  . acetaminophen (TYLENOL) 325 MG tablet Take 2 tablets (650 mg total) by mouth every 6 (six) hours as needed for mild pain.    Marland Kitchen ammonium lactate (AMLACTIN) 12 % cream Apply topically as needed for dry skin. 385 g 0  . aspirin EC 81 MG tablet Take 1 tablet (81 mg total) by mouth daily.    Marland Kitchen atorvastatin (LIPITOR) 80 MG tablet Take 1 tablet (80 mg total) by mouth daily. 90 tablet 3  . Blood Glucose Monitoring Suppl (ONETOUCH VERIO) w/Device KIT Use as directed 1 kit 0  . busPIRone (BUSPAR) 10 MG tablet  Take 1 tablet (10 mg total) by mouth 3 (three) times daily. 90 tablet 3  . diphenhydrAMINE (BENADRYL) 25 mg capsule Take 25 mg by mouth every 6 (six) hours as needed for allergies.    Marland Kitchen docusate sodium (COLACE) 100 MG capsule Take 1 capsule (100 mg total) by mouth daily as needed for mild constipation. 10 capsule 0  . furosemide (LASIX) 40 MG tablet Take 1 tablet (40 mg total) by mouth daily as needed for fluid or edema. 30 tablet 3  . glucose blood (ACCU-CHEK AVIVA PLUS) test strip Use as instructed to test blood sugar 5 times daily Dx E10.65 100 each 12  . Insulin Syringe-Needle U-100 (BD INSULIN SYRINGE ULTRAFINE) 31G X 5/16" 0.5 ML MISC 1 each by Does not apply route 4 (four) times daily. 120 each 5  . Lancets (ONETOUCH DELICA PLUS [id number]) MISC 1 Units by Does not apply route as directed. 100 each 11  . loperamide (IMODIUM) 2 MG capsule Take 1 capsule (2 mg total) by mouth as needed for diarrhea or loose stools. 30 capsule 0  . losartan (COZAAR) 25 MG tablet Take 1 tablet (25 mg total) by mouth daily. 30 tablet 11  . metoprolol tartrate (LOPRESSOR) 25 MG tablet Take 0.5 tablets (12.5 mg total) by mouth 2 (two) times daily. 60 tablet 3  . pantoprazole (PROTONIX) 40 MG tablet Take 1 tablet (40 mg total) by mouth daily. 30 tablet 3  . potassium chloride  (K-DUR) 10 MEQ tablet Take 1 tablet (10 mEq total) by mouth daily. 30 tablet 3  . pregabalin (LYRICA) 150 MG capsule Take 1 capsule (150 mg total) by mouth 2 (two) times daily. 180 capsule 1  . promethazine (PHENERGAN) 12.5 MG tablet Take 1 tablet (12.5 mg total) by mouth every 6 (six) hours as needed for nausea or vomiting. 30 tablet 0  . pyridostigmine (MESTINON) 60 MG tablet Take 60 mg by mouth 3 (three) times daily.    . insulin aspart (NOVOLOG) 100 UNIT/ML injection Inject 0-12 Units into the skin 4 (four) times daily -  before meals and at bedtime. Uses a sliding scale: 70-119 = 0 units, 120-150 = 2 units, 151-180 =  4 units, 181-210 = 5 units, 211-240 = 6 units, 241-270 = 8 units, 271-300 = 9 units, 301-330 = 10 units, 331-360 = 11 units, 361-400 = 12 units, greater than 400 notify MD.    . insulin detemir (LEVEMIR) 100 UNIT/ML injection Inject 0.15 mLs (15 Units total) into the skin every morning. 10 mL 1   No facility-administered medications prior to visit.     Allergies  Allergen Reactions  . Anesthetics, Amide Nausea And Vomiting  . Penicillins Diarrhea, Nausea And Vomiting and Other (See Comments)    Has patient had a PCN reaction causing immediate rash, facial/tongue/throat swelling, SOB or lightheadedness with hypotension: Yes Has patient had a PCN reaction causing severe rash involving mucus membranes or skin necrosis: No Has patient had a PCN reaction that required hospitalization No Has patient had a PCN reaction occurring within the last 10 years: Yes  If all of the above answers are "NO", then may proceed with Cephalosporin use.   . Buprenorphine Hcl Rash  . Encainide Nausea And Vomiting  . Metoclopramide Other (See Comments)    Dystonia, muscle rigidity.  Patient states she is only allergic to the "pill form, not the IV"    ROS Review of Systems  Constitutional: Negative for activity change, appetite change, chills, fatigue and unexpected weight  change.  Eyes:  Negative for visual disturbance.  Respiratory: Negative for cough and chest tightness.   Cardiovascular: Positive for leg swelling. Negative for chest pain and palpitations.  Gastrointestinal: Negative for abdominal distention, abdominal pain, constipation, diarrhea, nausea and vomiting.  Endocrine: Negative for polydipsia, polyphagia and polyuria.  Genitourinary: Positive for vaginal bleeding. Negative for frequency and menstrual problem.       Currently on menstrual cycle  Musculoskeletal: Negative for arthralgias and myalgias.  Skin: Negative for color change, pallor, rash and wound.  Neurological: Negative for facial asymmetry, speech difficulty and numbness.  Psychiatric/Behavioral: Negative for agitation, dysphoric mood, self-injury and suicidal ideas. The patient is not nervous/anxious.       Objective:    Physical Exam  Constitutional: She is oriented to person, place, and time. She appears well-developed and well-nourished. No distress.  Cardiovascular: Normal rate, regular rhythm and normal heart sounds.  Pulmonary/Chest: Effort normal and breath sounds normal. No respiratory distress. She has no wheezes.  Abdominal: Soft. Bowel sounds are normal. She exhibits no distension and no mass. There is no abdominal tenderness. There is no rebound and no guarding.  Musculoskeletal:        General: Edema present.     Right shoulder: She exhibits swelling.  Neurological: She is alert and oriented to person, place, and time.  Vitals reviewed.   BP (!) 128/59   Pulse 87   Temp 97.7 F (36.5 C) (Oral)   Ht _0  (1.549 m)   Wt 66.8 kg   SpO2 100%   BMI 27.81 kg/m  Wt Readings from Last 3 Encounters:  07/16/18 66.8 kg  06/12/18 64 kg  06/03/18 61.2 kg     Health Maintenance Due  Topic Date Due  . PNEUMOCOCCAL POLYSACCHARIDE VACCINE AGE 77-64 HIGH RISK  05/17/1969  . TETANUS/TDAP  05/17/1986  . PAP SMEAR-Modifier  05/17/1988  . MAMMOGRAM  05/17/2017  . COLONOSCOPY   05/17/2017  . OPHTHALMOLOGY EXAM  01/07/2018    There are no preventive care reminders to display for this patient.  Lab Results  Component Value Date   TSH 0.632 03/25/2017   Lab Results  Component Value Date   WBC 9.9 05/02/2018   HGB 9.7 (L) 05/02/2018   HCT 31.7 (L) 05/02/2018   MCV 93.0 05/02/2018   PLT 266 05/02/2018   Lab Results  Component Value Date   NA 137 05/02/2018   K 4.4 05/02/2018   CO2 17 (L) 05/02/2018   GLUCOSE 372 (H) 05/02/2018   BUN 25 (H) 05/02/2018   CREATININE 1.55 (H) 05/02/2018   BILITOT 0.7 05/01/2018   ALKPHOS 70 05/01/2018   AST 20 05/01/2018   ALT 18 05/01/2018   PROT 5.9 (L) 05/01/2018   ALBUMIN 3.0 (L) 05/01/2018   CALCIUM 8.0 (L) 05/02/2018   ANIONGAP 5 05/02/2018   Lab Results  Component Value Date   CHOL 121 01/07/2018   Lab Results  Component Value Date   HDL 47 01/07/2018   Lab Results  Component Value Date   LDLCALC 54 01/07/2018   Lab Results  Component Value Date   TRIG 101 01/07/2018   Lab Results  Component Value Date   CHOLHDL 2.6 01/07/2018   Lab Results  Component Value Date   HGBA1C 8.1 (A) 04/28/2018      Assessment & Plan:   Problem List Items Addressed This Visit      Cardiovascular and Mediastinum   Essential hypertension (Chronic)     Digestive   Diabetic  gastroparesis (HCC) (Chronic)   Relevant Medications   insulin detemir (LEVEMIR) 100 UNIT/ML injection   insulin aspart (NOVOLOG) 100 UNIT/ML injection     Endocrine   Diabetic neuropathy, type I diabetes mellitus (HCC) - Primary   Relevant Medications   insulin detemir (LEVEMIR) 100 UNIT/ML injection   insulin aspart (NOVOLOG) 100 UNIT/ML injection   Other Relevant Orders   POCT glucose (manual entry) (Completed)     Other   Anxiety and depression   S/P CABG x 3      Meds ordered this encounter  Medications  . insulin detemir (LEVEMIR) 100 UNIT/ML injection    Sig: Inject 0.15 mLs (15 Units total) into the skin every  morning.    Dispense:  10 mL    Refill:  1  . insulin aspart (NOVOLOG) 100 UNIT/ML injection    Sig: Inject 0-12 Units into the skin 3 (three) times daily before meals. As per sliding scale    Dispense:  30 mL    Refill:  0   Lab Results  Component Value Date   HGBA1C 8.1 (A) 04/28/2018    1. Annual Wellness Exam  - Annual wellness exam rescheduled due to patient being on her menstrual cycle.  - Reschedule comprehensive physical in 1 month.  2. Diabetes Mellitis Type I Uncontrolled Last Hgb A1C 8.1 - Refill insulin aspart and levemir. Patient has an appointment scheduled with the endocrinologist soon.    3.Essential HTN Controlled - [place] Diet/Lifestyle Modifications Discussed - Continue current medication regimen.  4.Diabetic Gastroparesis Resolved -  Diabetes management and diet discussed. - Continue current regimen of pyridostigmine.   Evaluation and management procedures were performed by me with DNP Student in attendance, note written by DNP student under my supervision and collaboration. I have reviewed the note and I agree with the management and plan.    Follow-up: Return in about 1 month (around 08/16/2018) for complete physical exam.    Neta Mends, RN    Evaluation and management procedures were performed by me with DNP Student in attendance, note written by DNP student under my supervision and collaboration. I have reviewed the note and I agree with the management and plan.    In summary 53 year old female with a history of uncontrolled type 1 diabetes mellitus (A1c 8.2), diabetic neuropathy, diabetic gastroparesis, hypertension, anemia, anxiety and depression, multiple ED visits and hospitalizations for diabetic gastroparesis, CAD s/p CABG x3 (LIMA to LAD, SVG to PDA, SVG to OM) in 12/2017  Who had an office visit for follow-up of chronic medical conditions with me last month and is currently doing well after her CABG.  Scheduled for a complete physical  exam today but is currently on her monthly cycle and medical conditions have been addressed. I have refilled Levemir but her NovoLog sliding scale is ambiguous and what she administers at home contradicts what is recommended on the chart.  I have advised her to strongly follow-up with her endocrinologist so this can be adjusted appropriately. Provided information for neuropsychiatric associates where she had previously been referred for anxiety and depression.    [name], MD, MHA, CPE, FACP, [place] and [facility] [place], [place]   07/17/2018, 8:27 AM

## 2018-07-16 NOTE — Telephone Encounter (Signed)
Met with the patient when she was in the clinic today for her appointment.  She was in very good spirits and explained that she has been feeling very good lately. She said that she never moved and is still living the same apartment with her sister. She continues to use [employer] for transportation

## 2018-07-17 ENCOUNTER — Encounter: Payer: Self-pay | Admitting: Family Medicine

## 2018-07-18 ENCOUNTER — Ambulatory Visit (HOSPITAL_COMMUNITY): Payer: Self-pay

## 2018-07-21 ENCOUNTER — Ambulatory Visit (HOSPITAL_COMMUNITY): Payer: Self-pay

## 2018-07-23 ENCOUNTER — Ambulatory Visit (HOSPITAL_COMMUNITY): Payer: Self-pay

## 2018-07-25 ENCOUNTER — Ambulatory Visit (HOSPITAL_COMMUNITY): Payer: Self-pay

## 2018-07-28 ENCOUNTER — Ambulatory Visit (HOSPITAL_COMMUNITY): Payer: Self-pay

## 2018-07-30 ENCOUNTER — Ambulatory Visit (HOSPITAL_COMMUNITY): Payer: Self-pay

## 2018-07-30 ENCOUNTER — Telehealth (HOSPITAL_COMMUNITY): Payer: Self-pay

## 2018-07-30 NOTE — Telephone Encounter (Signed)
3rd Attempted to call patient in regards to Cardiac Rehab - LM on VM °

## 2018-08-01 ENCOUNTER — Ambulatory Visit (HOSPITAL_COMMUNITY): Payer: Self-pay

## 2018-08-04 ENCOUNTER — Ambulatory Visit (HOSPITAL_COMMUNITY): Payer: Self-pay

## 2018-08-06 ENCOUNTER — Ambulatory Visit (HOSPITAL_COMMUNITY): Payer: Self-pay

## 2018-08-08 ENCOUNTER — Ambulatory Visit (HOSPITAL_COMMUNITY): Payer: Self-pay

## 2018-08-11 ENCOUNTER — Ambulatory Visit (HOSPITAL_COMMUNITY): Payer: Self-pay

## 2018-08-12 NOTE — Telephone Encounter (Signed)
No response from pt closed referral [name]. Support Rep II

## 2018-08-13 ENCOUNTER — Ambulatory Visit (HOSPITAL_COMMUNITY): Payer: Self-pay

## 2018-08-14 ENCOUNTER — Ambulatory Visit: Payer: Self-pay

## 2018-08-15 ENCOUNTER — Ambulatory Visit (HOSPITAL_COMMUNITY): Payer: Self-pay

## 2018-08-18 ENCOUNTER — Ambulatory Visit (HOSPITAL_COMMUNITY): Payer: Self-pay

## 2018-08-18 NOTE — Telephone Encounter (Signed)
Pt called and she was interested in participating in the Cardiac Rehab Program. Patient stated yes. Patient will come in for orientation on 09/16/2018 @ 1:30pm and will attend the 1:15pm exercise class.  Mailed homework package [place] Support Rep II

## 2018-08-18 NOTE — Telephone Encounter (Signed)
Pt called and she was interested in participating in the Cardiac Rehab Program. Patient stated yes. Patient will come in for orientation on 09/16/2018 @ 1:30pm and will attend the 1:15pm exercise class.  Mailed homework package [place] Support Rep II Pt only wants to come on [employer]

## 2018-08-20 ENCOUNTER — Ambulatory Visit (HOSPITAL_COMMUNITY): Payer: Self-pay

## 2018-08-22 ENCOUNTER — Ambulatory Visit (HOSPITAL_COMMUNITY): Payer: Self-pay

## 2018-08-22 IMAGING — DX DG CHEST 1V PORT
1 series · 1 of 1 positions shown · non-contrast
Comparison: 06/15/2017

CLINICAL DATA: Nausea and vomiting

EXAM:
PORTABLE CHEST 1 VIEW

[chest ap]
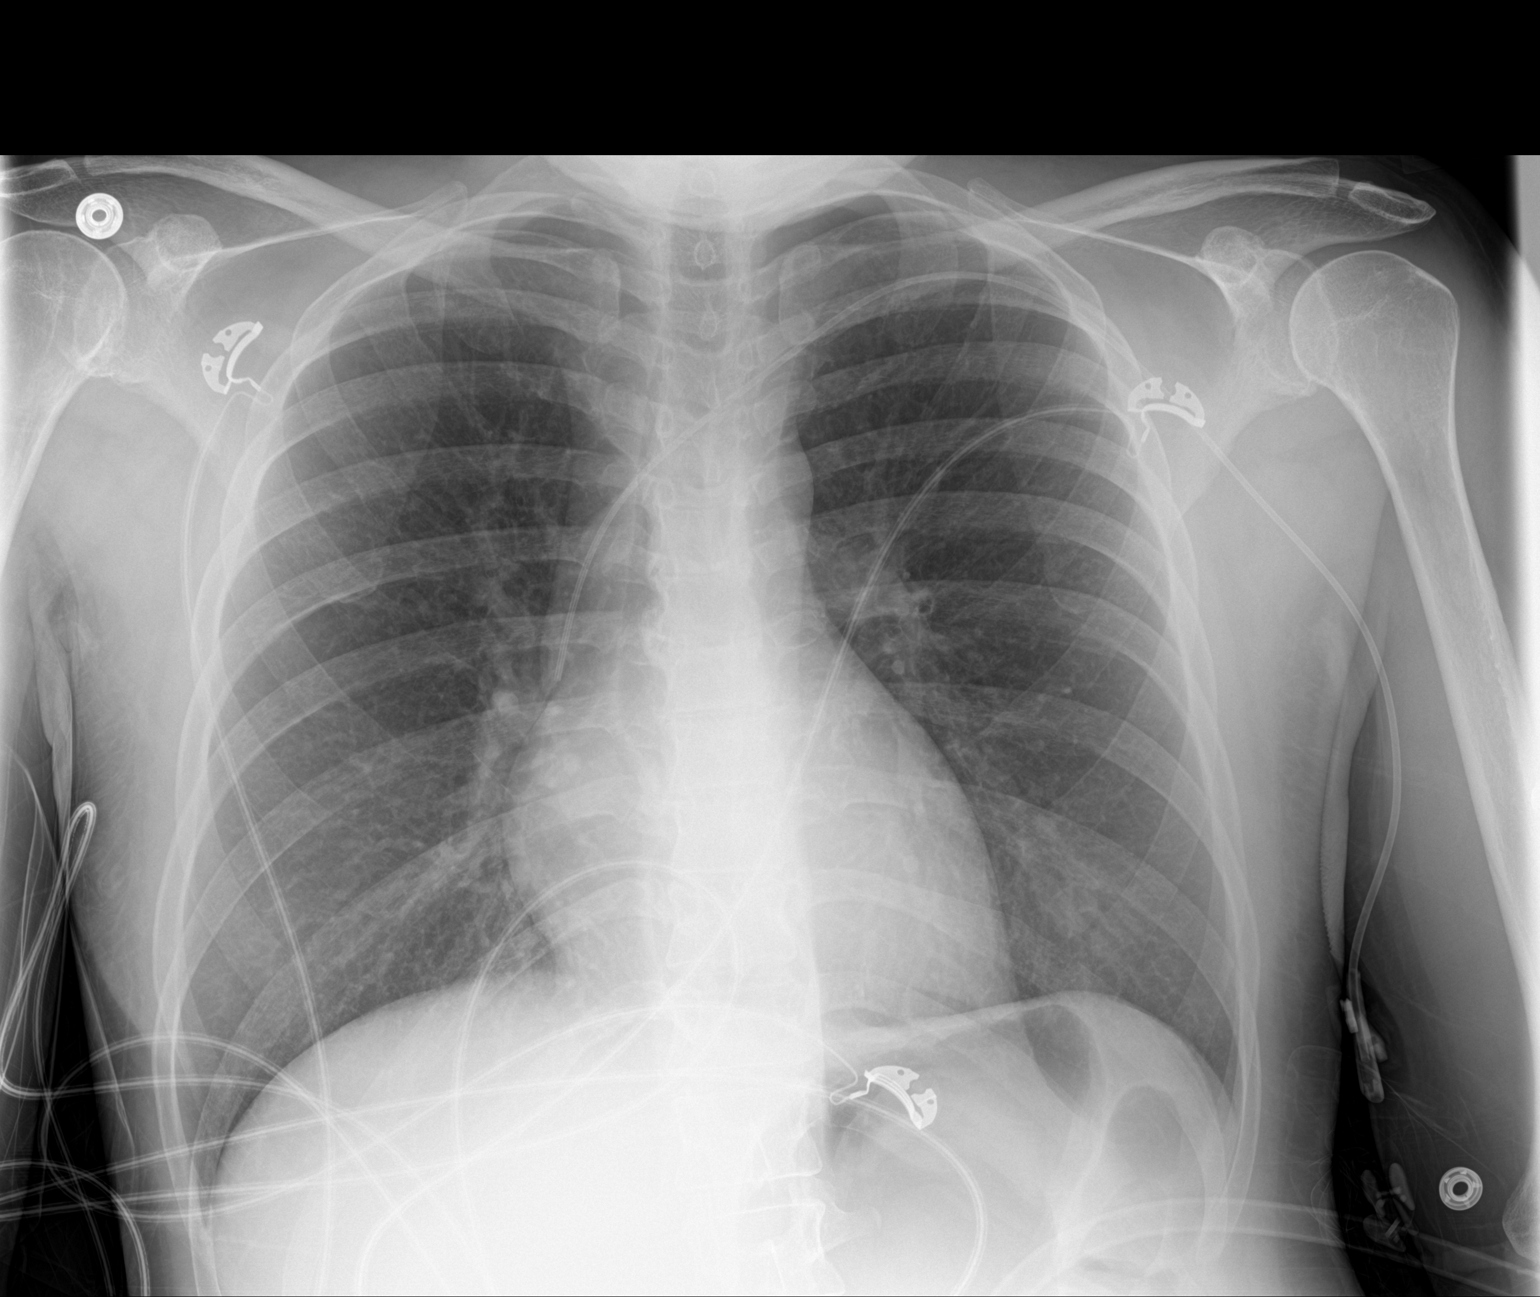

[1 of 1 positions shown; findings below may reference images not displayed]

FINDINGS: Left upper extremity catheter tip overlies the distal SVC. No focal
opacity or pleural effusion. Normal heart size. No pneumothorax.
IMPRESSION: No active disease. Left upper extremity catheter tip overlies the
SVC

## 2018-08-23 IMAGING — US US RENAL
1 series · 14 of 25 positions shown · non-contrast
Comparison: None.

CLINICAL DATA: Acute renal failure

EXAM:
RENAL / URINARY TRACT ULTRASOUND COMPLETE

[Series 1: us renal · 14 of 61 slices shown]
[im 1/61]
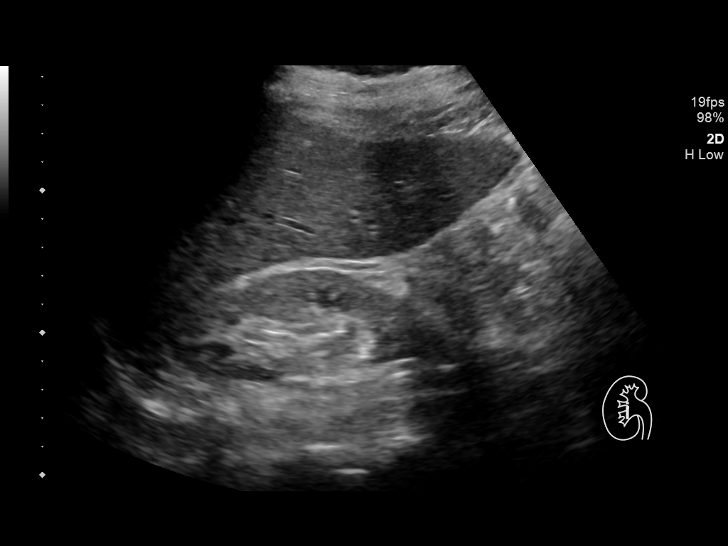
[im 6/61]
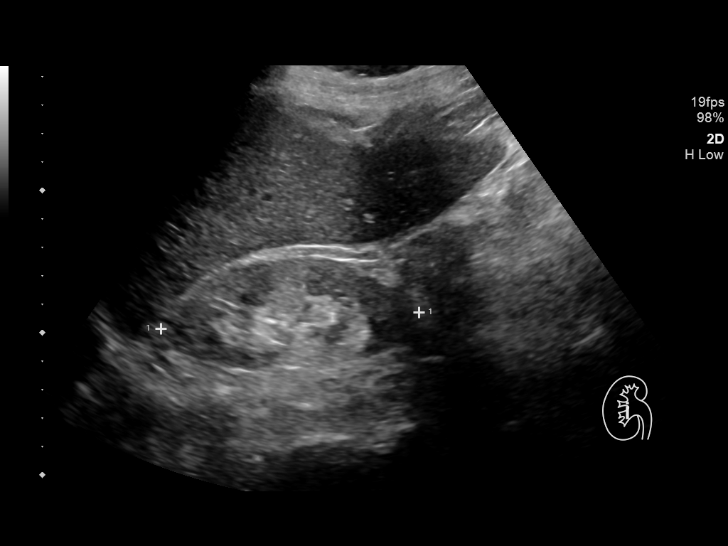
[im 11/61]
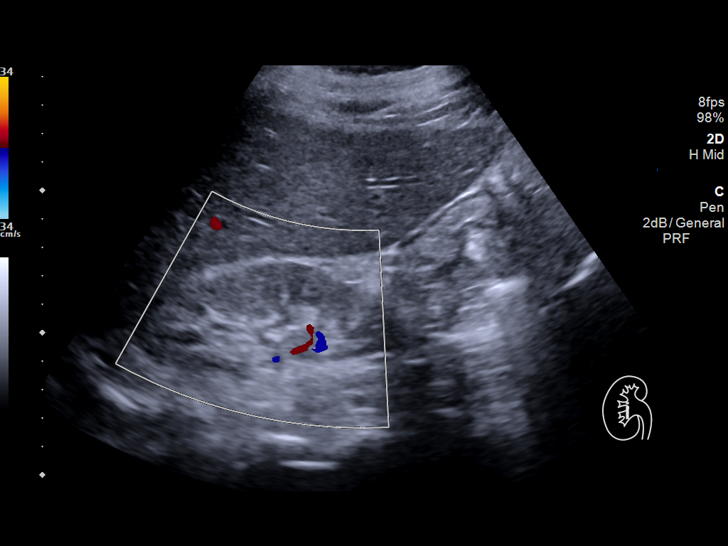
[im 16/61]
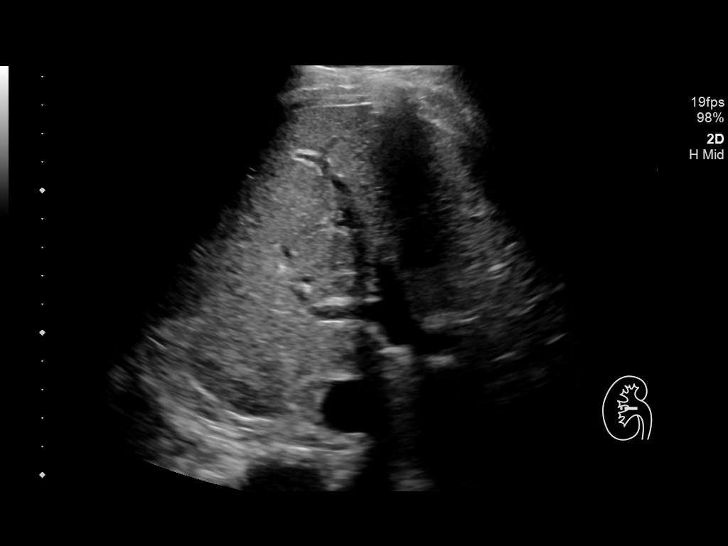
[im 21/61]
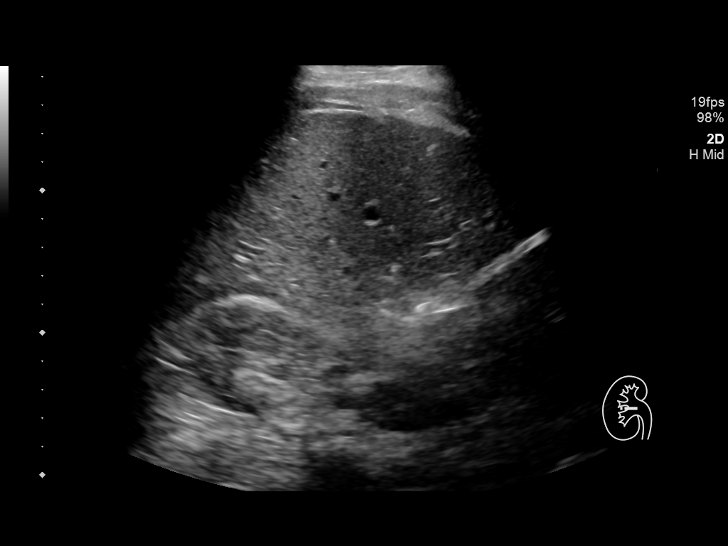
[im 23/61]
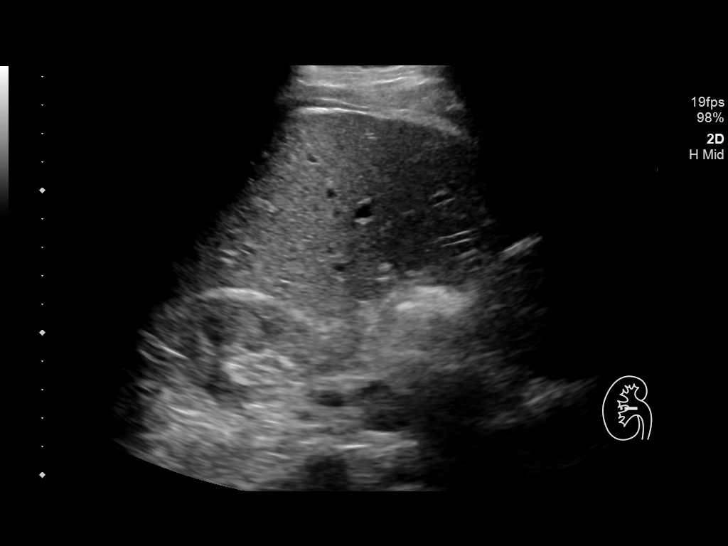
[im 28/61]
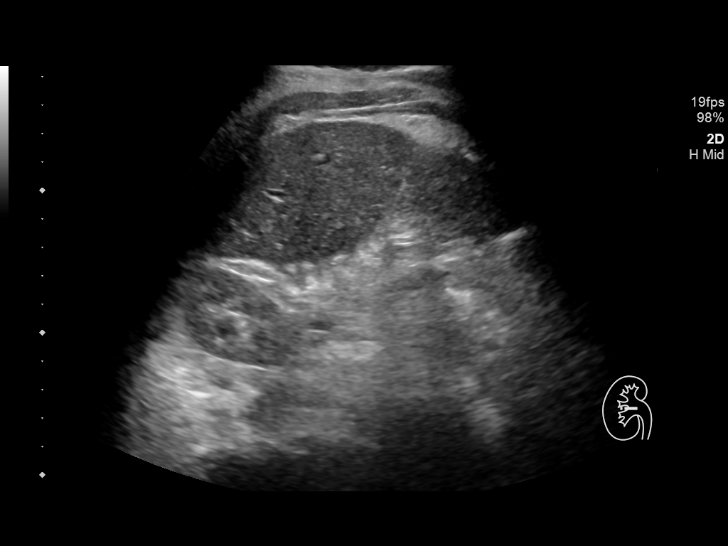
[im 33/61]
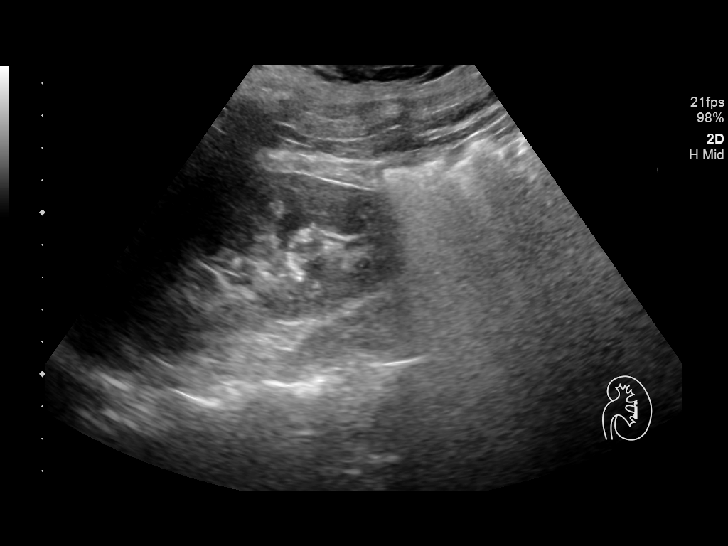
[im 38/61]
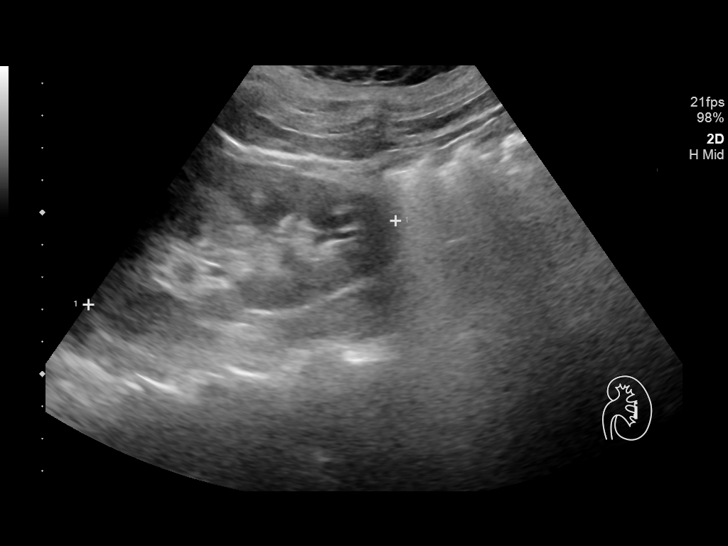
[im 41/61]
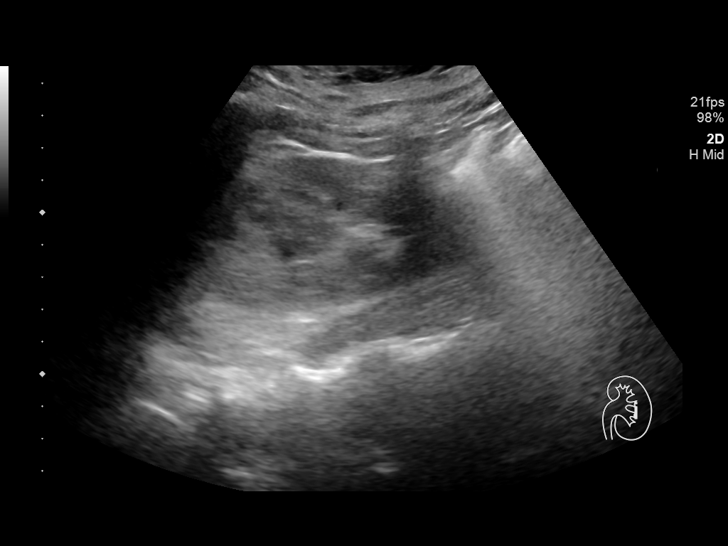
[im 46/61]
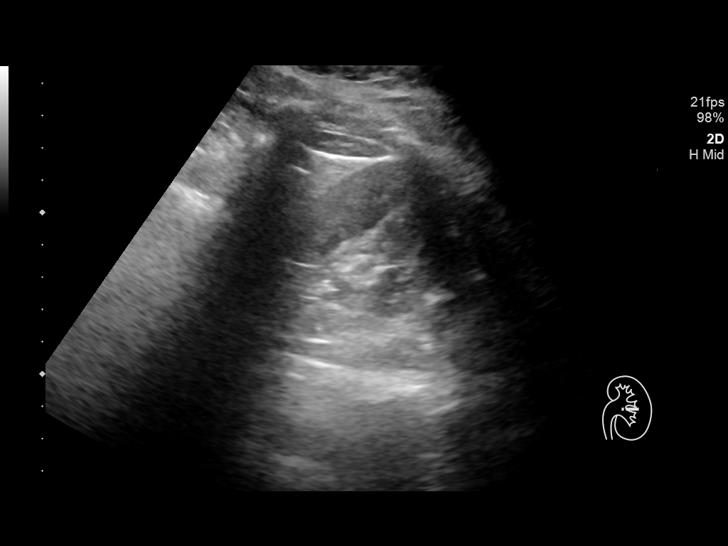
[im 51/61]
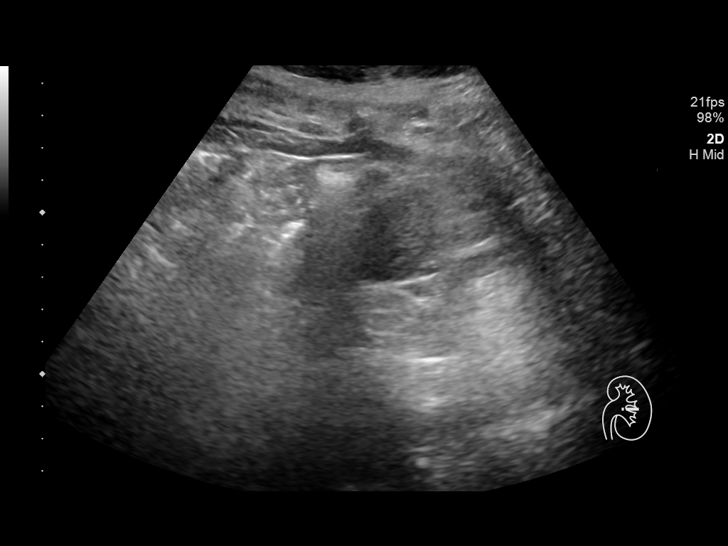
[im 56/61]
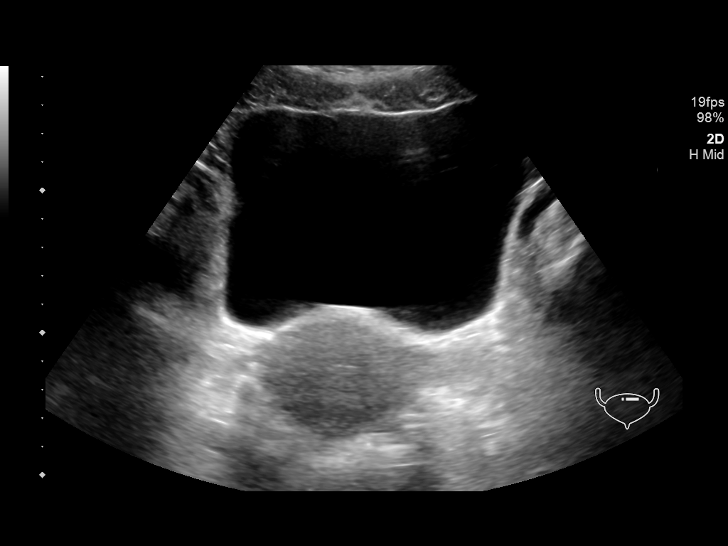
[im 61/61]
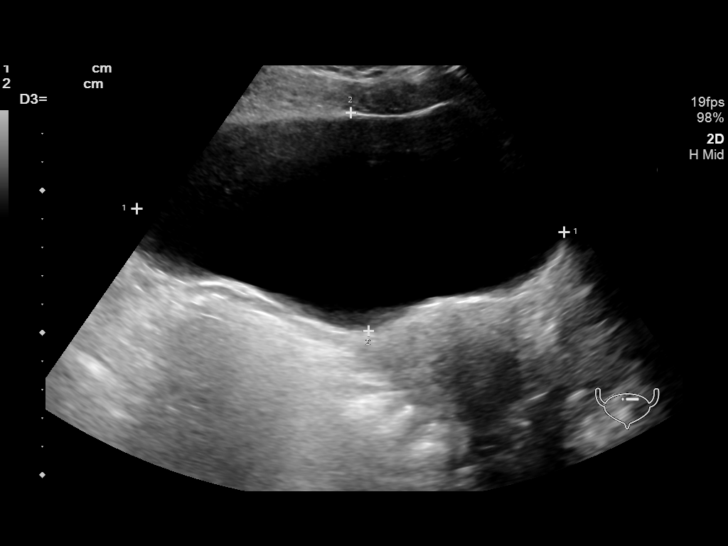

[14 of 25 positions shown; findings below may reference images not displayed]

FINDINGS: Right Kidney:

Length: 9.1 cm.  Increased echotexture.  No mass or hydronephrosis.

Left Kidney:

Length: 9.9 cm.  Increased echotexture.  No mass or hydronephrosis.

Bladder:

Appears normal for degree of bladder distention.
IMPRESSION: Small, echogenic kidneys bilaterally compatible with chronic medical
renal disease. No hydronephrosis.

## 2018-08-25 ENCOUNTER — Ambulatory Visit (HOSPITAL_COMMUNITY): Payer: Self-pay

## 2018-08-27 ENCOUNTER — Ambulatory Visit (HOSPITAL_COMMUNITY): Payer: Self-pay

## 2018-08-29 ENCOUNTER — Ambulatory Visit (HOSPITAL_COMMUNITY): Payer: Self-pay

## 2018-09-01 ENCOUNTER — Ambulatory Visit (HOSPITAL_COMMUNITY): Payer: Self-pay

## 2018-09-03 ENCOUNTER — Ambulatory Visit (HOSPITAL_COMMUNITY): Payer: Self-pay

## 2018-09-05 ENCOUNTER — Ambulatory Visit (HOSPITAL_COMMUNITY): Payer: Self-pay

## 2018-09-08 ENCOUNTER — Ambulatory Visit (HOSPITAL_COMMUNITY): Payer: Self-pay

## 2018-09-08 ENCOUNTER — Telehealth (HOSPITAL_COMMUNITY): Payer: Self-pay

## 2018-09-08 ENCOUNTER — Telehealth: Payer: Self-pay | Admitting: Cardiovascular Disease

## 2018-09-08 IMAGING — CR DG ABDOMEN ACUTE W/ 1V CHEST
3 series · 3 of 3 positions shown · non-contrast
Comparison: 11/05/2017 chest radiograph and prior studies.

CLINICAL DATA: Acute abdominal pain with nausea and vomiting.

EXAM:
DG ABDOMEN ACUTE W/ 1V CHEST

[w abdomen decub]
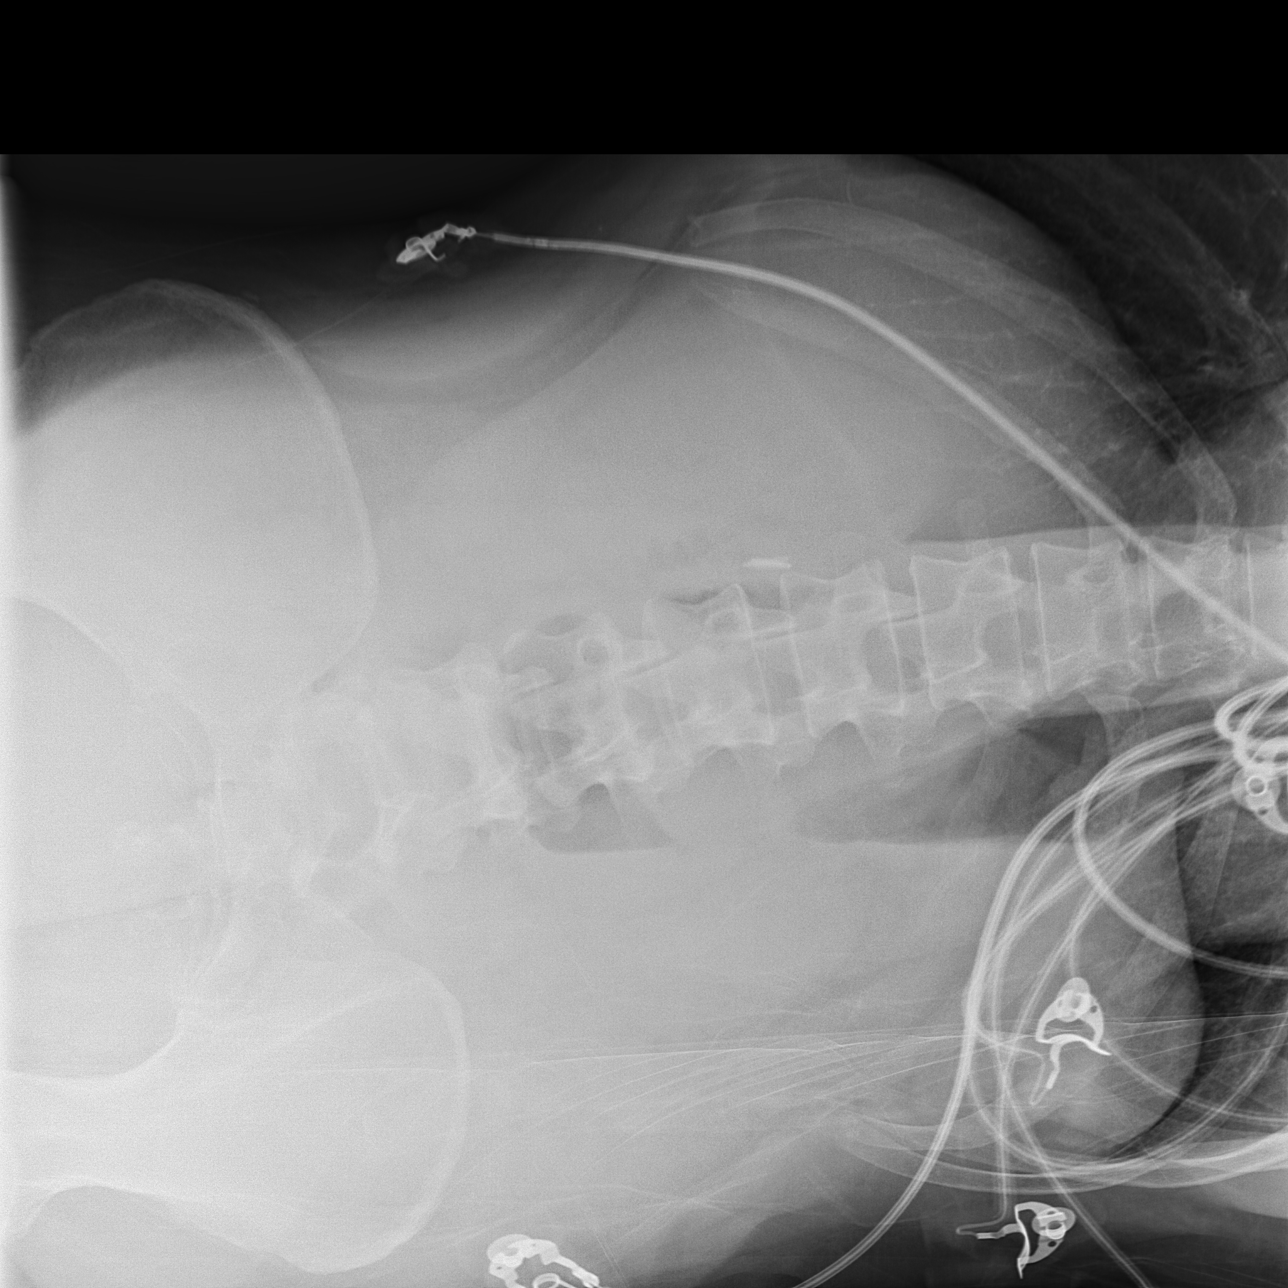

[x abdomen supine]
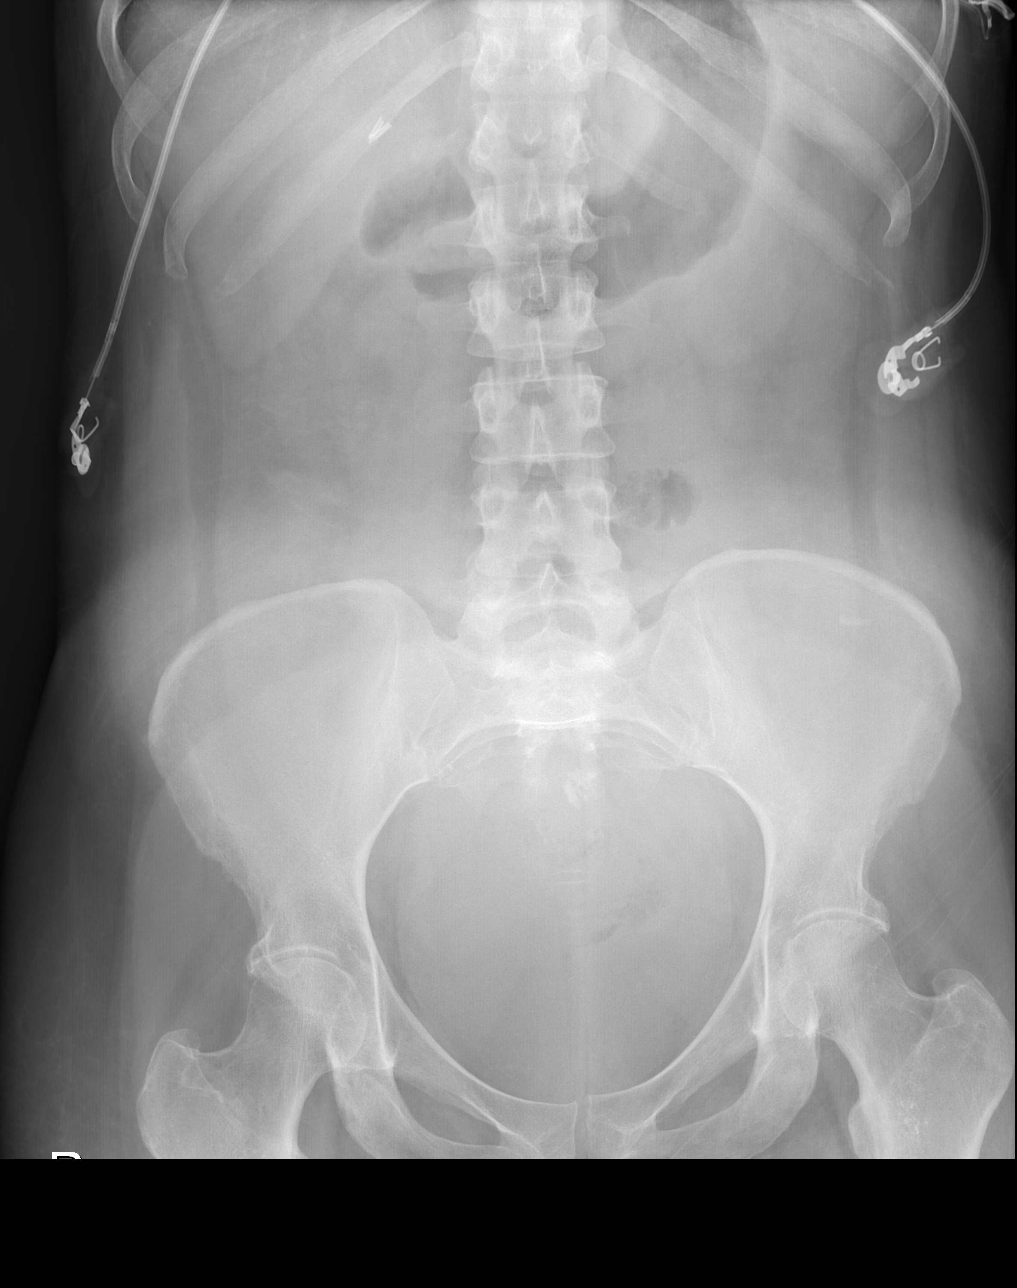

[x chest ap]
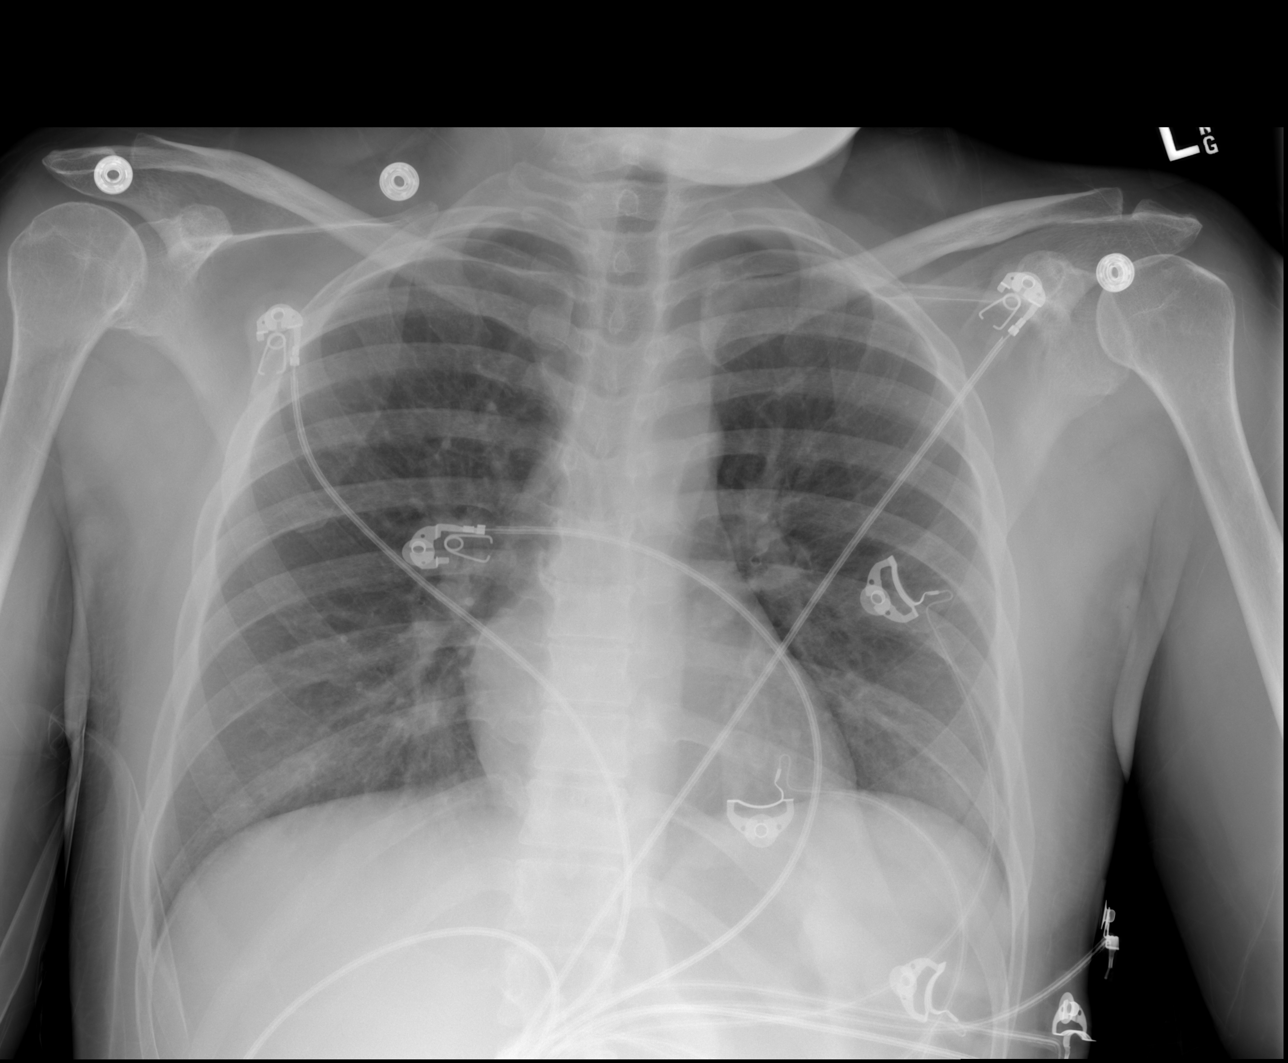

[3 of 3 positions shown; findings below may reference images not displayed]

FINDINGS: Gas in the nondistended stomach and only a small amount of gas in a
few small bowel loops identified.

No dilated bowel loops or pneumoperitoneum identified.

No suspicious calcifications noted.

The cardiomediastinal silhouette is unremarkable.

The lungs are clear.

No airspace disease, pleural effusion or pneumothorax.

No acute bony abnormality identified.
IMPRESSION: 1. No evidence of acute abnormality.

## 2018-09-08 NOTE — Telephone Encounter (Signed)
Called pt and let her know that the department is closing the next 2 weeks.

## 2018-09-08 NOTE — Telephone Encounter (Signed)
Spoke with pt and she denies any cardiac issues at this time and feels comfortable postponing appt.  Advised the office will contact her to reschedule.

## 2018-09-10 ENCOUNTER — Ambulatory Visit (HOSPITAL_COMMUNITY): Payer: Self-pay

## 2018-09-11 ENCOUNTER — Ambulatory Visit: Payer: Self-pay | Admitting: Cardiovascular Disease

## 2018-09-12 ENCOUNTER — Ambulatory Visit (HOSPITAL_COMMUNITY): Payer: Self-pay

## 2018-09-15 ENCOUNTER — Ambulatory Visit (HOSPITAL_COMMUNITY): Payer: Self-pay

## 2018-09-16 ENCOUNTER — Ambulatory Visit (HOSPITAL_COMMUNITY): Payer: Self-pay

## 2018-09-17 ENCOUNTER — Ambulatory Visit (HOSPITAL_COMMUNITY): Payer: Self-pay

## 2018-09-18 ENCOUNTER — Telehealth: Payer: Self-pay | Admitting: Endocrinology

## 2018-09-18 NOTE — Telephone Encounter (Signed)
error 

## 2018-09-21 NOTE — Progress Notes (Signed)
This encounter was created in error - please disregard.

## 2018-09-22 ENCOUNTER — Other Ambulatory Visit: Payer: Self-pay

## 2018-09-22 ENCOUNTER — Ambulatory Visit (HOSPITAL_COMMUNITY): Payer: Self-pay

## 2018-09-22 ENCOUNTER — Encounter: Payer: Self-pay | Admitting: Endocrinology

## 2018-09-22 MED ORDER — GLUCOSE BLOOD VI STRP: ORAL_STRIP | 0 refills | Status: DC

## 2018-09-24 ENCOUNTER — Ambulatory Visit (HOSPITAL_COMMUNITY): Payer: Self-pay

## 2018-09-25 ENCOUNTER — Other Ambulatory Visit: Payer: Self-pay

## 2018-09-25 ENCOUNTER — Ambulatory Visit: Payer: Self-pay | Attending: Family Medicine | Admitting: Family Medicine

## 2018-09-26 ENCOUNTER — Ambulatory Visit: Payer: Medicare HMO | Attending: Family Medicine | Admitting: Family Medicine

## 2018-09-26 ENCOUNTER — Ambulatory Visit (HOSPITAL_COMMUNITY): Payer: Self-pay

## 2018-09-26 ENCOUNTER — Encounter: Payer: Self-pay | Admitting: Family Medicine

## 2018-09-26 DIAGNOSIS — N925 Other specified irregular menstruation: Secondary | ICD-10-CM

## 2018-09-26 DIAGNOSIS — D508 Other iron deficiency anemias: Secondary | ICD-10-CM | POA: Diagnosis not present

## 2018-09-26 DIAGNOSIS — D509 Iron deficiency anemia, unspecified: Secondary | ICD-10-CM

## 2018-09-26 DIAGNOSIS — N938 Other specified abnormal uterine and vaginal bleeding: Secondary | ICD-10-CM | POA: Diagnosis not present

## 2018-09-26 DIAGNOSIS — N926 Irregular menstruation, unspecified: Secondary | ICD-10-CM

## 2018-09-26 MED ORDER — MEGESTROL ACETATE 20 MG PO TABS: 20.0000 mg | ORAL_TABLET | Freq: Every day | ORAL | 0 refills | Status: AC

## 2018-09-26 NOTE — Progress Notes (Signed)
Patient verified DOB Patient has taken medication today. Patient has eaten today. Patient complains of cramping scaled at a 4, cramping began 11 days ago when March cycle presented and is currently bleeding. Patient last sugar level was 165 after eating.

## 2018-09-26 NOTE — Progress Notes (Signed)
Virtual Visit via Telephone Note  I connected with [name] on 09/26/18 at  3:30 PM EDT by telephone and verified that I am speaking with the correct person using two identifiers.  Due to current restrictions/limitations of-office visits due to the COVID-19 pandemic, this scheduled clinical appointment was converted to a telehealth visit with patient's consent   I discussed the limitations, risks, security and privacy concerns of performing an evaluation and management service by telephone and the availability of in person appointments. I also discussed with the patient that there may be a patient responsible charge related to this service. The patient expressed understanding and agreed to proceed.   History of Present Illness:      53 year old female with complaint of abnormal, prolonged menstrual bleeding.  Patient states that her menses usually occur once per month anywhere from 5 to 7 days that she had onset of her menses on 09/15/2018 and continues to have her menses.  Patient states that some days are heavy like a second day of her.  And some days are lighter.  Patient denies passage of large blood clots.  Patient denies any vaginal discharge prior to onset of her bleeding.  Patient states that she is having to change pads about every 3-4 hours and patient has to set an alarm at night so that she can get up and change her pad otherwise she will bleed through the pad.  Patient occasionally has large gushes of blood. She has never had abnormal bleeding like this in the past. Patient has some mild, crampy lower abdominal pain that is about a 3-4 on a 0-to-10 scale of 0 being no pain and 10 being the worst pain imaginable      Patient denies any urinary frequency or urgency.  No increased mid to lower back pain.  No pelvic/vaginal pain.  No fever or chills.  Patient does have fatigue.  Patient reports that she has history of anemia and she is worried that she may have become more anemic due to her  current prolonged menses.  Patient believes that her Pap smear is likely overdue. Patient has not had a colonoscopy. Patient denies any personal or close family history of blood clots-no DVT's/PE's.   Patient Active Problem List   Diagnosis Date Noted  . Diabetic ketoacidosis ([place]) 04/30/2018  . Gastroparesis 04/13/2018  . DKA (diabetic ketoacidosis) ([place]) 03/31/2018  . S/P CABG x 3 01/14/2018  . CAD (coronary artery disease), native coronary artery 01/11/2018  . Dehydration   . MDD (major depressive disorder), single episode, severe , no psychosis ([place])   . DKA (diabetic ketoacidoses) ([place]) 06/16/2017  . Tardive dyskinesia   . DKA, type 1 ([place]) 03/18/2016  . Noncompliance with treatment plan 03/13/2016  . Essential hypertension 01/24/2016  . HLD (hyperlipidemia)   . GERD (gastroesophageal reflux disease)   . AKI (acute kidney injury) ([place])   . Anemia, iron deficiency   . Vitamin B12 deficiency 08/16/2015  . Diabetic gastroparesis ([place])   . Diabetic neuropathy, type I diabetes mellitus ([place]) 05/18/2015  . Anxiety and depression 05/18/2015     Current Outpatient Medications on File Prior to Visit  Medication Sig Dispense Refill  . acetaminophen (TYLENOL) 325 MG tablet Take 2 tablets (650 mg total) by mouth every 6 (six) hours as needed for mild pain.    Marland Kitchen ammonium lactate (AMLACTIN) 12 % cream Apply topically as needed for dry skin. 385 g 0  . aspirin EC 81 MG tablet Take 1 tablet (81  mg total) by mouth daily.    . Blood Glucose Monitoring Suppl (ONETOUCH VERIO) w/Device KIT Use as directed 1 kit 0  . busPIRone (BUSPAR) 10 MG tablet Take 1 tablet (10 mg total) by mouth 3 (three) times daily. 90 tablet 3  . diphenhydrAMINE (BENADRYL) 25 mg capsule Take 25 mg by mouth every 6 (six) hours as needed for allergies.    Marland Kitchen docusate sodium (COLACE) 100 MG capsule Take 1 capsule (100 mg total) by mouth daily as needed for mild constipation. 10 capsule 0  . furosemide (LASIX) 40 MG tablet Take  1 tablet (40 mg total) by mouth daily as needed for fluid or edema. 30 tablet 3  . glucose blood (ACCU-CHEK AVIVA PLUS) test strip Use as instructed to test blood sugar 5 times daily Dx E10.65 150 each 0  . insulin aspart (NOVOLOG) 100 UNIT/ML injection Inject 0-12 Units into the skin 3 (three) times daily before meals. As per sliding scale (Patient taking differently: Inject 0-14 Units into the skin 3 (three) times daily before meals. As per sliding scale) 30 mL 0  . Insulin Syringe-Needle U-100 (BD INSULIN SYRINGE ULTRAFINE) 31G X 5/16" 0.5 ML MISC 1 each by Does not apply route 4 (four) times daily. 120 each 5  . Lancets (ONETOUCH DELICA PLUS [id number]) MISC 1 Units by Does not apply route as directed. 100 each 11  . loperamide (IMODIUM) 2 MG capsule Take 1 capsule (2 mg total) by mouth as needed for diarrhea or loose stools. 30 capsule 0  . losartan (COZAAR) 25 MG tablet Take 1 tablet (25 mg total) by mouth daily. 30 tablet 11  . metoprolol tartrate (LOPRESSOR) 25 MG tablet Take 0.5 tablets (12.5 mg total) by mouth 2 (two) times daily. 60 tablet 3  . pantoprazole (PROTONIX) 40 MG tablet Take 1 tablet (40 mg total) by mouth daily. 30 tablet 3  . potassium chloride (K-DUR) 10 MEQ tablet Take 1 tablet (10 mEq total) by mouth daily. (Patient taking differently: Take 10 mEq by mouth daily as needed. Take 1 tablet by mouth when taking Lasix.) 30 tablet 3  . pregabalin (LYRICA) 150 MG capsule Take 1 capsule (150 mg total) by mouth 2 (two) times daily. 180 capsule 1  . promethazine (PHENERGAN) 12.5 MG tablet Take 1 tablet (12.5 mg total) by mouth every 6 (six) hours as needed for nausea or vomiting. 30 tablet 0  . pyridostigmine (MESTINON) 60 MG tablet Take 60 mg by mouth 3 (three) times daily.     No current facility-administered medications on file prior to visit.     Allergies  Allergen Reactions  . Anesthetics, Amide Nausea And Vomiting  . Penicillins Diarrhea, Nausea And Vomiting and Other (See  Comments)    Has patient had a PCN reaction causing immediate rash, facial/tongue/throat swelling, SOB or lightheadedness with hypotension: Yes Has patient had a PCN reaction causing severe rash involving mucus membranes or skin necrosis: No Has patient had a PCN reaction that required hospitalization No Has patient had a PCN reaction occurring within the last 10 years: Yes  If all of the above answers are "NO", then may proceed with Cephalosporin use.   . Buprenorphine Hcl Rash  . Encainide Nausea And Vomiting  . Metoclopramide Other (See Comments)    Dystonia, muscle rigidity.  Patient states she is only allergic to the "pill form, not the IV"    Social History   Tobacco Use  . Smoking status: Never Smoker  . Smokeless tobacco: Never  Used  Substance Use Topics  . Alcohol use: No  . Drug use: No     Family History  Problem Relation Age of Onset  . Cystic fibrosis Mother   . Hypertension Father   . Diabetes Brother   . Hypertension Maternal Grandmother     Past Surgical History:  Procedure Laterality Date  . CARDIAC SURGERY    . COLONOSCOPY  09/27/2014   at [facility]  . CORONARY ARTERY BYPASS GRAFT N/A 01/14/2018   Procedure: CORONARY ARTERY BYPASS GRAFTING (CABG)X3, RIGHT AND LEFT SAPHENOUS VEIN HARVEST, MAMMARY TAKE DOWN. MAMMARY TO LAD, SVG TO PD, SVG TO DISTAL CIRC.;  Surgeon: Grace Isaac, MD;  Location: [place];  Service: Open Heart Surgery;  Laterality: N/A;  . ESOPHAGOGASTRODUODENOSCOPY  09/27/2014   at [facility], Dr Rolan Lipa. biospy neg for celiac, neg for H pylori.   . EYE SURGERY    . gailstones    . IR FLUORO GUIDE CV LINE RIGHT  02/01/2017  . IR FLUORO GUIDE CV LINE RIGHT  03/06/2017  . IR FLUORO GUIDE CV LINE RIGHT  03/25/2017  . IR GENERIC HISTORICAL  01/24/2016   IR FLUORO GUIDE CV LINE RIGHT 01/24/2016 Darrell K Allred, PA-C [facility]  . IR GENERIC HISTORICAL  01/24/2016   IR US GUIDE VASC ACCESS RIGHT 01/24/2016 Darrell K Allred, PA-C [facility]  . IR US  GUIDE VASC ACCESS RIGHT  02/01/2017  . IR US GUIDE VASC ACCESS RIGHT  03/06/2017  . IR US GUIDE VASC ACCESS RIGHT  03/25/2017  . LEFT HEART CATH AND CORONARY ANGIOGRAPHY N/A 01/07/2018   Procedure: LEFT HEART CATH AND CORONARY ANGIOGRAPHY;  Surgeon: Troy Sine, MD;  Location: [place] [facility];  Service: Cardiovascular;  Laterality: N/A;  . POSTERIOR VITRECTOMY AND MEMBRANE PEEL-LEFT EYE  09/28/2002  . POSTERIOR VITRECTOMY AND MEMBRANE PEEL-RIGHT EYE  03/16/2002  . RETINAL DETACHMENT SURGERY    . TEE WITHOUT CARDIOVERSION N/A 01/14/2018   Procedure: TRANSESOPHAGEAL ECHOCARDIOGRAM (TEE);  Surgeon: Grace Isaac, MD;  Location: [place];  Service: Open Heart Surgery;  Laterality: N/A;    ROS: Review of Systems  Constitutional: Positive for malaise/fatigue. Negative for chills and fever.  Respiratory: Negative for cough and shortness of breath.   Cardiovascular: Negative for chest pain and palpitations.  Gastrointestinal: Positive for abdominal pain. Negative for blood in stool, constipation, diarrhea, heartburn, melena and nausea.  Genitourinary: Negative for dysuria and frequency.  Musculoskeletal: Negative for back pain and myalgias.  Neurological: Negative for dizziness, weakness and headaches.  Endo/Heme/Allergies: Negative for polydipsia. Does not bruise/bleed easily.     PHYSICAL EXAM: LMP 09/15/2018    No vitals signs or physical exam done as visit was conducted by telephone Future Appointments  Date Time Provider [place]  10/07/2018  2:45 PM Marzetta Board, DPM [facility]  10/13/2018  1:15 PM [facility] PHASE II EXC [facility] None  10/15/2018  1:15 PM [facility] PHASE II EXC [facility] None  10/17/2018  1:15 PM [facility] PHASE II EXC [facility] None  10/20/2018  1:15 PM [facility] PHASE II EXC [facility] None  10/22/2018  1:15 PM [facility] PHASE II EXC [facility] None  10/24/2018  1:15 PM [facility] PHASE II EXC [facility] None  10/27/2018  1:15 PM [facility] PHASE II EXC [facility]  None  10/29/2018  1:15 PM [facility] PHASE II EXC [facility] None  10/31/2018  1:15 PM [facility] PHASE II EXC [facility] None  11/03/2018  1:15 PM [facility] PHASE II EXC [facility] None  11/05/2018  1:15 PM [facility] PHASE II EXC [facility] None  11/07/2018  1:15 PM [facility] PHASE II EXC [facility] None  11/10/2018  1:15 PM [facility] PHASE II EXC [facility] None  11/12/2018  1:15 PM [facility] PHASE II EXC [facility] None  11/14/2018  1:15 PM [facility] PHASE II EXC [facility] None  11/19/2018  1:15 PM [facility] PHASE II EXC [facility] None  11/21/2018  1:15 PM [facility] PHASE II EXC [facility] None  11/24/2018  1:15 PM [facility] PHASE II EXC [facility] None  11/26/2018  1:15 PM [facility] PHASE II EXC [facility] None  11/28/2018  1:15 PM [facility] PHASE II EXC [facility] None  12/01/2018  1:15 PM [facility] PHASE II EXC [facility] None  12/03/2018  1:15 PM [facility] PHASE II EXC [facility] None  12/05/2018  1:15 PM [facility] PHASE II EXC [facility] None  12/08/2018  1:15 PM [facility] PHASE II EXC [facility] None  12/10/2018  1:15 PM [facility] PHASE II EXC [facility] None  12/12/2018  1:15 PM [facility] PHASE II EXC [facility] None  12/15/2018  1:15 PM [facility] PHASE II EXC [facility] None  12/17/2018  1:15 PM [facility] PHASE II EXC [facility] None  12/19/2018  1:15 PM [facility] PHASE II EXC [facility] None  12/22/2018  1:15 PM [facility] PHASE II EXC [facility] None  12/24/2018  1:15 PM [facility] PHASE II EXC [facility] None        Observations/Objective: No vital signs or physical examination done as this visit was conducted via telephone encounter   Assessment and Plan: 1. Abnormal menses Patient reports that she had onset of menses on 09/15/2018 and continues to have daily bleeding.  Patient states that previously her menses were regular.  Patient upon questioning does recall that when she was on 31 she was told she had some small fibroids and I discussed with the patient that this could be because of her abnormal bleeding but that she also needs GYN follow-up to make sure there are  no other abnormalities are contributing to her bleeding.  Patient will come to clinic next week to have CBC in follow-up of her prolonged bleeding as well as a history of anemia and patient has been referred to gynecology.  Prescription has also been sent to patient's pharmacy for Megace 20 mg once daily x5 days to help reduce or stop current menses. - CBC with Differential; Future - Ambulatory referral to Gynecology  2. Iron deficiency anemia, unspecified iron deficiency anemia type Patient will come in for a CBC next week as she is known to have anemia and has now had prolonged menses which could further worsen her anemia and patient will be advised if she needs to increase iron therapy. Referral also placed to GYN - CBC with Differential; Future - Ambulatory referral to Gynecology  Follow Up Instructions: Return if symptoms worsen or fail to improve, for lab visit next week; GYN referral placed.    I discussed the assessment and treatment plan with the patient. The patient was provided an opportunity to ask questions and all were answered. The patient agreed with the plan and demonstrated an understanding of the instructions.   The patient was advised to call back or seek an in-person evaluation if the symptoms worsen or if the condition fails to improve as anticipated.  I provided 12  minutes of non-face-to-face time during this encounter.   Antony Blackbird, MD

## 2018-09-29 ENCOUNTER — Ambulatory Visit (HOSPITAL_COMMUNITY): Payer: Self-pay

## 2018-09-29 IMAGING — DX DG ABD PORTABLE 1V
1 series · 1 of 1 positions shown · non-contrast
Comparison: 11/22/2017

CLINICAL DATA: Nausea.  History of diabetes.  GERD, hypertension.

EXAM:
PORTABLE ABDOMEN - 1 VIEW

[abdomen kub]
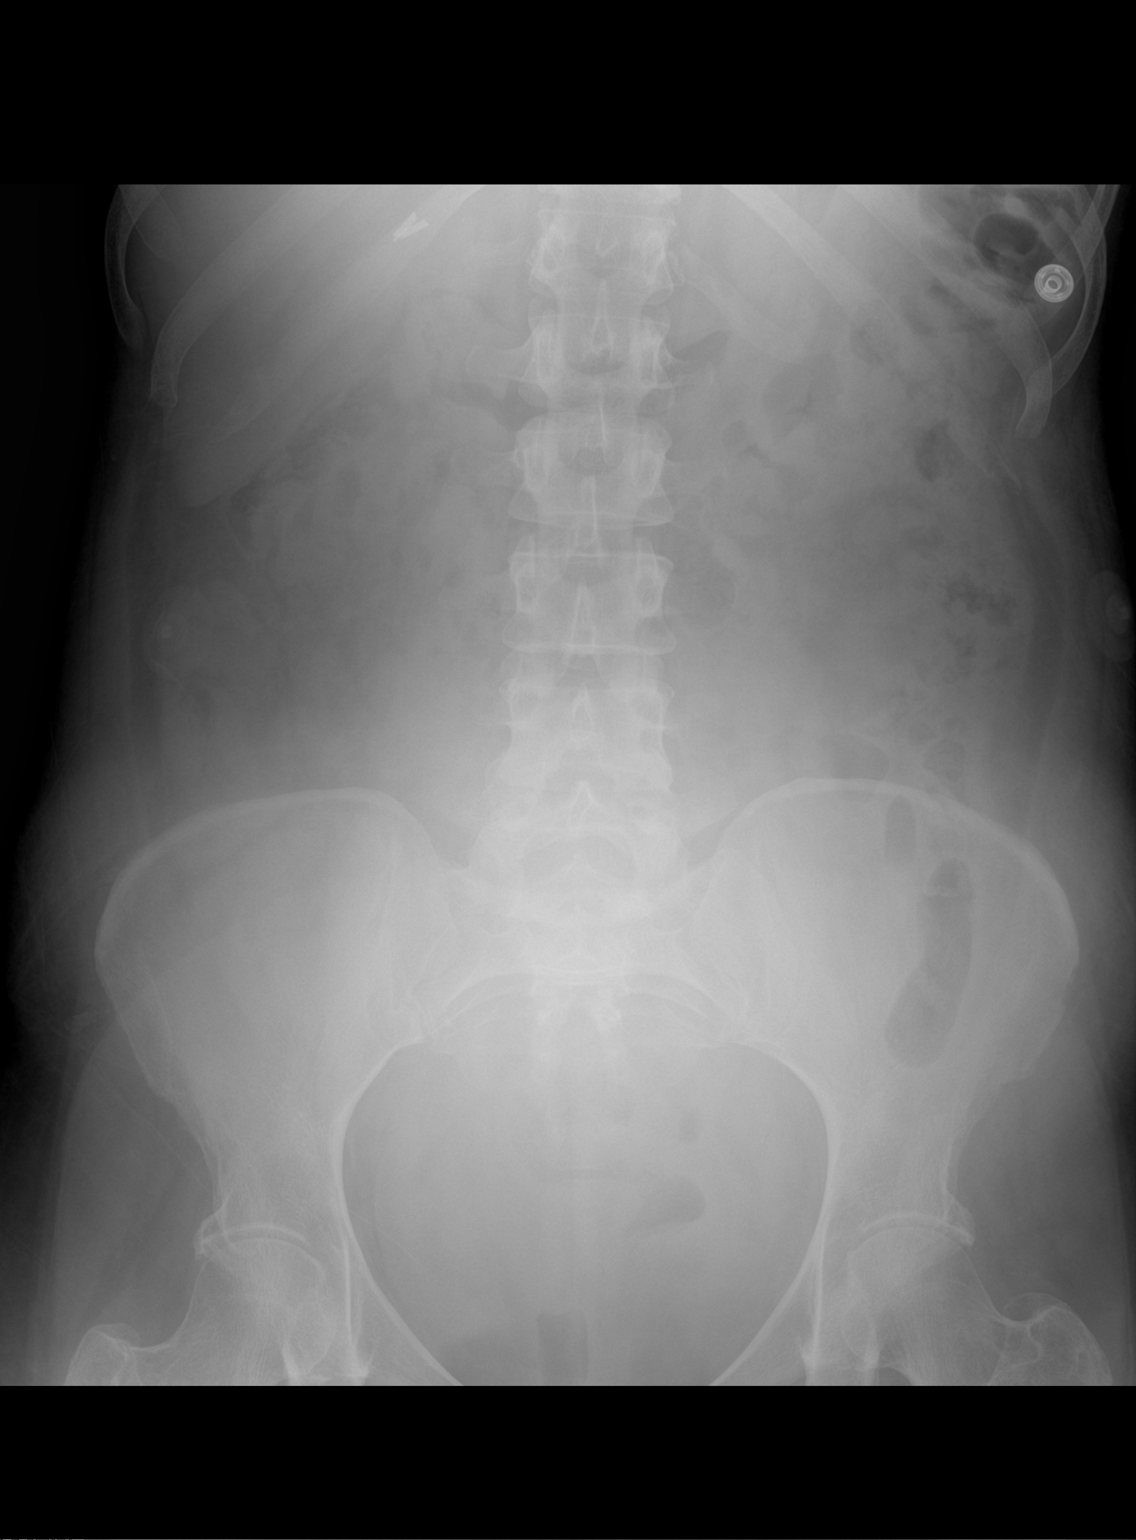

[1 of 1 positions shown; findings below may reference images not displayed]

FINDINGS: Bowel gas pattern is nonobstructive. Moderate stool burden. Surgical
clips are seen in the RIGHT UPPER QUADRANT. No organomegaly or
abnormal calcifications. Visualized osseous structures have a normal
appearance.
IMPRESSION: Negative.

## 2018-09-29 IMAGING — US IR PICC >5YO
1 series · 1 of 1 positions shown · non-contrast
Comparison: none

CLINICAL DATA: Diabetic ketoacidosis, poor peripheral IV access

EXAM:
PICC PLACEMENT WITH ULTRASOUND AND FLUOROSCOPY
FLUOROSCOPY TIME:  1.3 minutes; 196 uNym6 DAP
TECHNIQUE: After written informed consent was obtained, patient was placed in
the supine position on angiographic table. Patency of the right
brachial vein was confirmed with ultrasound with image
documentation. An appropriate skin site was determined. Skin site
was marked. Region was prepped using maximum barrier technique
including cap and mask, sterile gown, sterile gloves, large sterile
sheet, and Chlorhexidine as cutaneous antisepsis. The region was
infiltrated locally with 1% lidocaine. Under real-time ultrasound
guidance, the right brachial vein was accessed with a 21 gauge
micropuncture needle; the needle tip within the vein was confirmed
with ultrasound image documentation. Needle exchanged over a 018
guidewire for a peel-away sheath, through which a 5-French
double-lumen power injectable PICC trimmed to 30cm was advanced,
positioned with its tip near the cavoatrial junction. There was
focal resistance in the central subclavian vein, overcome by using 2
parallel 018 guidewires. Spot chest radiograph confirms appropriate
catheter position. Catheter was flushed per protocol and secured
externally. The patient tolerated procedure well.
COMPLICATIONS:
COMPLICATIONS
none

[Series 1: ir picc >5yo · 0.06mm/px · 1 of 1 slices shown]
[im 1/1]
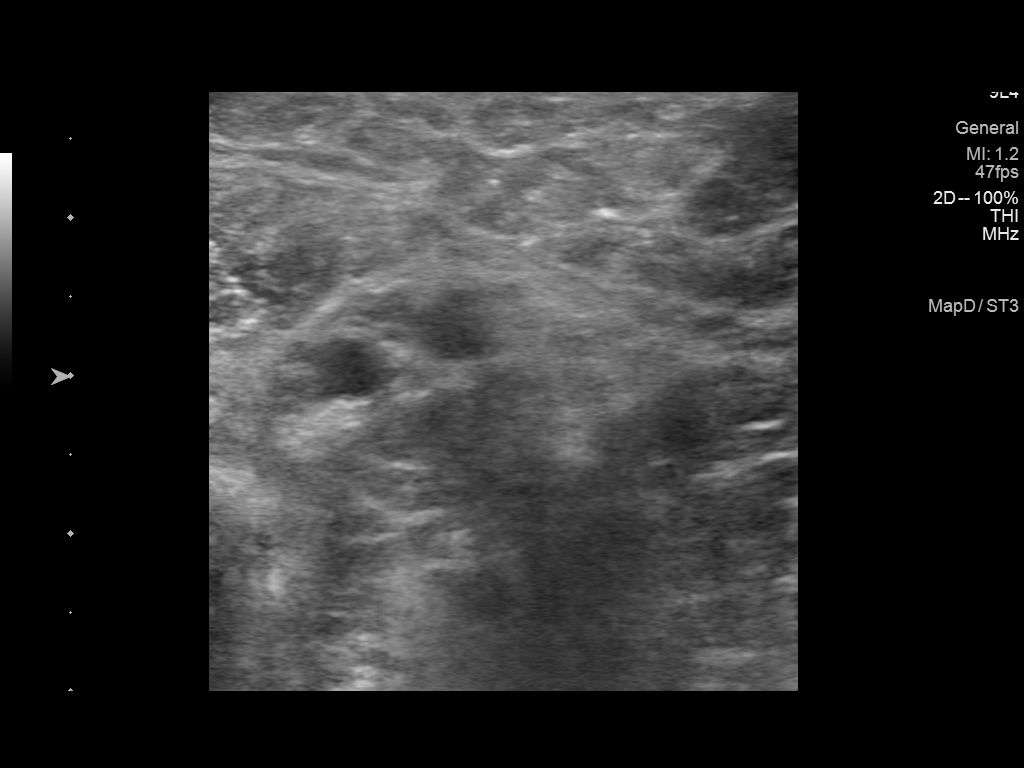

[1 of 1 positions shown; findings below may reference images not displayed]

IMPRESSION: 1. Technically successful five French double lumen power injectable
PICC placement

## 2018-10-01 ENCOUNTER — Ambulatory Visit (HOSPITAL_COMMUNITY): Payer: Self-pay

## 2018-10-01 ENCOUNTER — Telehealth: Payer: Self-pay | Admitting: Physician Assistant

## 2018-10-01 NOTE — Telephone Encounter (Signed)
Called patient regarding recent cancellation of appointment due  COVID 19.  Left voice mail to call back to set up virtual visit with Dr. Acie Fredrickson or APP if interested.

## 2018-10-03 ENCOUNTER — Ambulatory Visit (HOSPITAL_COMMUNITY): Payer: Self-pay

## 2018-10-06 ENCOUNTER — Ambulatory Visit (HOSPITAL_COMMUNITY): Payer: Self-pay

## 2018-10-07 ENCOUNTER — Telehealth: Payer: Self-pay | Admitting: Cardiovascular Disease

## 2018-10-07 ENCOUNTER — Ambulatory Visit: Payer: Medicare Other | Admitting: Podiatry

## 2018-10-07 ENCOUNTER — Telehealth: Payer: Self-pay

## 2018-10-07 NOTE — Telephone Encounter (Signed)
Call received from patient.  She said that she has not heard from GYN about scheduling an appointment.  Provided her with the phone number for [facility].  She also inquired how she can have prescriptions changed from one pharmacy to another.  She wanted to know how to transfer prescription from [place] [facility]. [facility] and explained to her that [facility].  She then stated that she needs refills for novolog and levemir. However, her insurance will not cover  levimir - $300. She said that the insurance will cover lantus. She is requesting the prescriptions be sent to [place]. She understands that she needs to have an A1c as per Dr Dwyane Dee but she has not been able to have that done yet.

## 2018-10-07 NOTE — Telephone Encounter (Signed)
Spoke with patient who confirmed all demographics.  Sent patient My Chart link. Has smart phone for video visit. Gave verbal consent for virtual visit.

## 2018-10-07 NOTE — Telephone Encounter (Signed)
Virtual Visit Pre-Appointment Phone Call  Steps For Call:  Confirm consent - "In the setting of the current Covid19 crisis, you are scheduled for a (phone or video) visit with your provider on (date) at (time).  Just as we do with many in-office visits, in order for you to participate in this visit, we must obtain consent.  If you'd like, I can send this to your mychart (if signed up) or email for you to review.  Otherwise, I can obtain your verbal consent now.  All virtual visits are billed to your insurance company just like a normal visit would be.  By agreeing to a virtual visit, we'd like you to understand that the technology does not allow for your provider to perform an examination, and thus may limit your provider's ability to fully assess your condition.  Finally, though the technology is pretty good, we cannot assure that it will always work on either your or our end, and in the setting of a video visit, we may have to convert it to a phone-only visit.  In either situation, we cannot ensure that we have a secure connection.  Are you willing to proceed?" STAFF: Did the patient verbally acknowledge consent to telehealth visit? YES   1. Confirm the BEST phone number to call the day of the visit: [phone]  2. Give patient instructions for WebEx/MyChart download to smartphone as below or Doximity/Doxy.me if video visit (depending on what platform provider is using)  3. Advise patient to be prepared with any vital sign or heart rhythm information, their current medicines, and a piece of paper and pen handy for any instructions they may receive the day of their visit  4. Inform patient they will receive a phone call 15 minutes prior to their appointment time (may be from unknown caller ID) so they should be prepared to answer  5. Confirm that appointment type is correct in Epic appointment notes (video vs telephone)     TELEPHONE CALL NOTE  [name] has been deemed a  candidate for a follow-up tele-health visit to limit community exposure during the Covid-19 pandemic. I spoke with the patient via phone to ensure availability of phone/video source, confirm preferred email & phone number, and discuss instructions and expectations.  I reminded [name] to be prepared with any vital sign and/or heart rhythm information that could potentially be obtained via home monitoring, at the time of her visit. I reminded [name] to expect a phone call at the time of her visit if her visit.  [name], [facility] 10/07/2018 12:38 PM   DOWNLOADING THE WEBEX APP TO SMARTPHONE  - If Apple, ask patient to go to App Store and type in WebEx in the search bar. [place] [employer], the blue/green circle. If Android, go to [employer] and type in [employer] in the search bar. The app is free but as with any other app downloads, their phone may require them to verify saved payment information or Apple/Android password.  - The patient does NOT have to create an account. - On the day of the visit, the assist will walk the patient through joining the meeting with the meeting number/password.  DOWNLOADING THE MYCHART APP TO SMARTPHONE  - If Apple, go to [employer] and type in MyChart in the search bar and download the app. If Android, ask patient to go to [employer] and type in [place] in the search bar and download the app.  The app is free but as with any other app downloads, their phone may require them to verify saved payment information or Apple/Android password.  - The patient will need to then log into the app with their MyChart username and password, and select [facility] as their healthcare provider to link the account. When it is time for your visit, go to the MyChart app, find appointments, and click Begin Video Visit. Be sure to Select Allow for your device to access the Microphone and Camera for your visit. You will then be connected, and your provider  will be with you shortly.  **If they have any issues connecting, or need assistance please contact [place] [phone])**  **If using a computer, in order to ensure the best quality for your visit they will need to use either of the following Internet Browsers: Microsoft [place], or Google Chrome**  [place]   I hereby voluntarily request, consent and authorize [facility] and its employed or contracted physicians, physician assistants, nurse practitioners or other licensed health care professionals (the Practitioner), to provide me with telemedicine health care services (the Services") as deemed necessary by the treating Practitioner. I acknowledge and consent to receive the Services by the Practitioner via telemedicine. I understand that the telemedicine visit will involve communicating with the Practitioner through live audiovisual communication technology and the disclosure of certain medical information by electronic transmission. I acknowledge that I have been given the opportunity to request an in-person assessment or other available alternative prior to the telemedicine visit and am voluntarily participating in the telemedicine visit.  I understand that I have the right to withhold or withdraw my consent to the use of telemedicine in the course of my care at any time, without affecting my right to future care or treatment, and that the Practitioner or I may terminate the telemedicine visit at any time. I understand that I have the right to inspect all information obtained and/or recorded in the course of the telemedicine visit and may receive copies of available information for a reasonable fee.  I understand that some of the potential risks of receiving the Services via telemedicine include:   Delay or interruption in medical evaluation due to technological equipment failure or disruption;  Information transmitted may not be  sufficient (e.g. poor resolution of images) to allow for appropriate medical decision making by the Practitioner; and/or   In rare instances, security protocols could fail, causing a breach of personal health information.  Furthermore, I acknowledge that it is my responsibility to provide information about my medical history, conditions and care that is complete and accurate to the best of my ability. I acknowledge that Practitioner's advice, recommendations, and/or decision may be based on factors not within their control, such as incomplete or inaccurate data provided by me or distortions of diagnostic images or specimens that may result from electronic transmissions. I understand that the practice of medicine is not an exact science and that Practitioner makes no warranties or guarantees regarding treatment outcomes. I acknowledge that I will receive a copy of this consent concurrently upon execution via email to the email address I last provided but may also request a printed copy by calling the office of [facility].    I understand that my insurance will be billed for this visit.   I have read or had this consent read to me.  I understand the contents of this consent, which adequately explains the benefits and risks  of the Services being provided via telemedicine.   I have been provided ample opportunity to ask questions regarding this consent and the Services and have had my questions answered to my satisfaction.  I give my informed consent for the services to be provided through the use of telemedicine in my medical care  By participating in this telemedicine visit I agree to the above.

## 2018-10-08 ENCOUNTER — Telehealth (INDEPENDENT_AMBULATORY_CARE_PROVIDER_SITE_OTHER): Payer: Medicare HMO | Admitting: Cardiovascular Disease

## 2018-10-08 ENCOUNTER — Ambulatory Visit (HOSPITAL_COMMUNITY): Payer: Self-pay

## 2018-10-08 ENCOUNTER — Encounter: Payer: Self-pay | Admitting: Cardiovascular Disease

## 2018-10-08 ENCOUNTER — Other Ambulatory Visit: Payer: Self-pay

## 2018-10-08 DIAGNOSIS — Z7189 Other specified counseling: Secondary | ICD-10-CM

## 2018-10-08 DIAGNOSIS — I2510 Atherosclerotic heart disease of native coronary artery without angina pectoris: Secondary | ICD-10-CM

## 2018-10-08 DIAGNOSIS — I10 Essential (primary) hypertension: Secondary | ICD-10-CM

## 2018-10-08 MED ORDER — METOPROLOL TARTRATE 25 MG PO TABS: 12.5000 mg | ORAL_TABLET | Freq: Two times a day (BID) | ORAL | 3 refills | Status: DC

## 2018-10-08 MED ORDER — LOSARTAN POTASSIUM 25 MG PO TABS: 25.0000 mg | ORAL_TABLET | Freq: Every day | ORAL | 3 refills | Status: DC

## 2018-10-08 NOTE — Patient Instructions (Addendum)
Medication Instructions:  Your physician recommends that you continue on your current medications as directed. Please refer to the Current Medication list given to you today. [facility]    Lab work: You need to have lab work on Friday April 17. You may come in anytime between 7:30 am and 4:30 pm and you do not have to fast.     Testing/Procedures: None Ordered     Follow-Up: Your physician recommends that you return for a follow-up appointment on Friday August 21 at 9:40 am with Dr. Acie Fredrickson. Please call our office at [phone] to reschedule if needed

## 2018-10-08 NOTE — Progress Notes (Signed)
Virtual Visit via Video Note   This visit type was conducted due to national recommendations for restrictions regarding the COVID-19 Pandemic (e.g. social distancing) in an effort to limit this patient's exposure and mitigate transmission in our community.  Due to her co-morbid illnesses, this patient is at least at moderate risk for complications without adequate follow up.  This format is felt to be most appropriate for this patient at this time.  All issues noted in this document were discussed and addressed.  A limited physical exam was performed with this format.  Please refer to the patient's chart for her consent to telehealth for [facility].  2 Evaluation Performed:  Follow-up visit  Date:  10/08/2018   ID:  [name], DOB 19-Jan-1966, MRN [id number]  Patient Location: Home Provider Location: Home  PCP:  [name], MD  Cardiologist:  Mertie Moores, MD  Electrophysiologist:  None   Chief Complaint:  Follow up CAD   Problem list 1.  Coronary artery disease-status post coronary artery bypass grafting 2.  Poorly controlled diabetes mellitus-complicated by noncompliance and neuropathy 3.  Hyperlipidemia  History of Present Illness:    [name] is a 53 y.o. female with history of coronary artery disease-status post coronary artery bypass grafting.  She has a history of poorly controlled diabetes mellitus.  She has a history of hyperlipidemia.  She has a history of chronic systolic congestive heart failure.  Ejection fraction at the time of heart catheterization was 30 to 35%.  Pt has done well. No CP or dyspnea Her cardiac rehab was cancelled. ( covid 19 concerns )  Still has not done any OP cardiac rehab. - had 1 month of SNF rehab. Gets fatigue with walking but no cp or dyspnea  Ran out of her metoprolol several months ago  [facility] [address] [facility]   No fever, no cold symptoms,  No cough Has been staying at home   The patient does not have  symptoms concerning for COVID-19 infection (fever, chills, cough, or new shortness of breath).    Past Medical History:  Diagnosis Date  . AKI (acute kidney injury) ([place])   . Anemia, iron deficiency   . Anxiety and depression 05/18/2015  . CAD (coronary artery disease), native coronary artery 01/11/2018   Non-STEMI with severe three-vessel disease noted July 2019  . Depression   . Diabetes mellitus without complication ([place])   . Diabetic gastroparesis ([place])   . Diabetic neuropathy, type I diabetes mellitus ([place]) 05/18/2015  . DKA (diabetic ketoacidoses) ([place]) 06/16/2017  . DKA, type 1 ([place]) 03/18/2016  . Essential hypertension   . Gastroparesis   . GERD (gastroesophageal reflux disease)   . HLD (hyperlipidemia)   . MDD (major depressive disorder), single episode, severe , no psychosis ([place])   . Non-ST elevation (NSTEMI) myocardial infarction ([place])   . S/P CABG x 3 01/14/2018  . Tardive dyskinesia   . Vitamin B12 deficiency 08/16/2015   Past Surgical History:  Procedure Laterality Date  . CARDIAC SURGERY    . COLONOSCOPY  09/27/2014   at [facility]  . CORONARY ARTERY BYPASS GRAFT N/A 01/14/2018   Procedure: CORONARY ARTERY BYPASS GRAFTING (CABG)X3, RIGHT AND LEFT SAPHENOUS VEIN HARVEST, MAMMARY TAKE DOWN. MAMMARY TO LAD, SVG TO PD, SVG TO DISTAL CIRC.;  Surgeon: Grace Isaac, MD;  Location: [facility];  Service: Open Heart Surgery;  Laterality: N/A;  . ESOPHAGOGASTRODUODENOSCOPY  09/27/2014   at [facility], Dr Rolan Lipa. biospy neg for  celiac, neg for H pylori.   . EYE SURGERY    . gailstones    . IR FLUORO GUIDE CV LINE RIGHT  02/01/2017  . IR FLUORO GUIDE CV LINE RIGHT  03/06/2017  . IR FLUORO GUIDE CV LINE RIGHT  03/25/2017  . IR GENERIC HISTORICAL  01/24/2016   IR FLUORO GUIDE CV LINE RIGHT 01/24/2016 Darrell K Allred, PA-C [facility]  . IR GENERIC HISTORICAL  01/24/2016   IR US GUIDE VASC ACCESS RIGHT 01/24/2016 Darrell K Allred, PA-C [facility]  . IR US GUIDE VASC ACCESS RIGHT   02/01/2017  . IR US GUIDE VASC ACCESS RIGHT  03/06/2017  . IR US GUIDE VASC ACCESS RIGHT  03/25/2017  . LEFT HEART CATH AND CORONARY ANGIOGRAPHY N/A 01/07/2018   Procedure: LEFT HEART CATH AND CORONARY ANGIOGRAPHY;  Surgeon: Troy Sine, MD;  Location: [facility] CV LAB;  Service: Cardiovascular;  Laterality: N/A;  . POSTERIOR VITRECTOMY AND MEMBRANE PEEL-LEFT EYE  09/28/2002  . POSTERIOR VITRECTOMY AND MEMBRANE PEEL-RIGHT EYE  03/16/2002  . RETINAL DETACHMENT SURGERY    . TEE WITHOUT CARDIOVERSION N/A 01/14/2018   Procedure: TRANSESOPHAGEAL ECHOCARDIOGRAM (TEE);  Surgeon: Grace Isaac, MD;  Location: [facility];  Service: Open Heart Surgery;  Laterality: N/A;     Current Meds  Medication Sig  . ammonium lactate (AMLACTIN) 12 % cream Apply topically as needed for dry skin.  Marland Kitchen aspirin EC 81 MG tablet Take 1 tablet (81 mg total) by mouth daily.  . Blood Glucose Monitoring Suppl (ONETOUCH VERIO) w/Device KIT Use as directed  . busPIRone (BUSPAR) 10 MG tablet Take 1 tablet (10 mg total) by mouth 3 (three) times daily.  . diphenhydrAMINE (BENADRYL) 25 mg capsule Take 25 mg by mouth every 6 (six) hours as needed for allergies.  Marland Kitchen docusate sodium (COLACE) 100 MG capsule Take 1 capsule (100 mg total) by mouth daily as needed for mild constipation.  . furosemide (LASIX) 40 MG tablet Take 1 tablet (40 mg total) by mouth daily as needed for fluid or edema.  Marland Kitchen glucose blood (ACCU-CHEK AVIVA PLUS) test strip Use as instructed to test blood sugar 5 times daily Dx E10.65  . insulin aspart (NOVOLOG) 100 UNIT/ML injection Inject 0-12 Units into the skin 3 (three) times daily before meals. As per sliding scale     Allergies:   Anesthetics, amide; Penicillins; Buprenorphine hcl; Encainide; and Metoclopramide   Social History   Tobacco Use  . Smoking status: Never Smoker  . Smokeless tobacco: Never Used  Substance Use Topics  . Alcohol use: No  . Drug use: No     Family Hx: The patient's family  history includes Cystic fibrosis in her mother; Diabetes in her brother; Hypertension in her father and maternal grandmother.  ROS:   Please see the history of present illness.     All other systems reviewed and are negative.   Prior CV studies:   The following studies were reviewed today:    Labs/Other Tests and Data Reviewed:    EKG:  No ECG reviewed.  Recent Labs: 04/13/2018: Magnesium 2.1 05/01/2018: ALT 18 05/02/2018: BUN 25; Creatinine, Ser 1.55; Hemoglobin 9.7; Platelets 266; Potassium 4.4; Sodium 137   Recent Lipid Panel Lab Results  Component Value Date/Time   CHOL 121 01/07/2018 05:10 AM   TRIG 101 01/07/2018 05:10 AM   HDL 47 01/07/2018 05:10 AM   CHOLHDL 2.6 01/07/2018 05:10 AM   LDLCALC 54 01/07/2018 05:10 AM    Wt Readings  from Last 3 Encounters:  07/16/18 147 lb 3.2 oz (66.8 kg)  06/12/18 141 lb (64 kg)  06/03/18 135 lb (61.2 kg)     Objective:    Vital Signs:  Ht 5' 2"  (1.575 m)   LMP 09/15/2018   BMI 26.92 kg/m    General:   Middle age , obese female ,   NAD HEENT:   No obvious JVD or lymphadenopathy Resp:   Normal work of breathing,   resp rate is normal  CV :   BP and HR are normal ,  No edema Abd:   No abdomina swelling , Ext:   No clubbing, cyanosis, or edema  Neuro:   Alert and oriented x 3.   No obvious motor deficits Skin : no obvious rashes   ASSESSMENT & PLAN:    1.  CAD:   No angina .   Continue current meds.  Has some keloid formation on her sternotomy scar. I've recommended that she put Vit E oil on the scar    2.  Chronic systolic CHF:   On metoprolol and losartan.  She did not do cardiac rehab.  Ive encouraged her to exercise  She has been on Losartan - never had her lab work. Will have her come get a BMP this Friday  Refill her metoprolol     COVID-19 Education: The signs and symptoms of COVID-19 were discussed with the patient and how to seek care for testing (follow up with PCP or arrange E-visit).  The importance  of social distancing was discussed today.  Time:   Today, I have spent  26  minutes with the patient with telehealth technology discussing the above problems.     Medication Adjustments/Labs and Tests Ordered: Current medicines are reviewed at length with the patient today.  Concerns regarding medicines are outlined above.   Tests Ordered: No orders of the defined types were placed in this encounter.   Medication Changes: No orders of the defined types were placed in this encounter.   Disposition:  Follow up in 4 month(s)  Signed, Mertie Moores, MD  10/08/2018 12:55 PM    [place]

## 2018-10-08 NOTE — Telephone Encounter (Signed)
Please advise her to contact her endocrinologist who now manages her diabetes mellitus to prevent confusion with dosing.  I also do not see Levemir on her med list.

## 2018-10-09 IMAGING — CR DG ABDOMEN ACUTE W/ 1V CHEST
4 series · 4 of 4 positions shown · non-contrast
Comparison: Single-view of the chest 12/10/2017. Plain films chest
and abdomen 11/22/2017.

CLINICAL DATA: Abdominal and back pain. Shortness of breath.
History of gastroparesis.

EXAM:
DG ABDOMEN ACUTE W/ 1V CHEST

[x abdomen supine (1 of 2)]
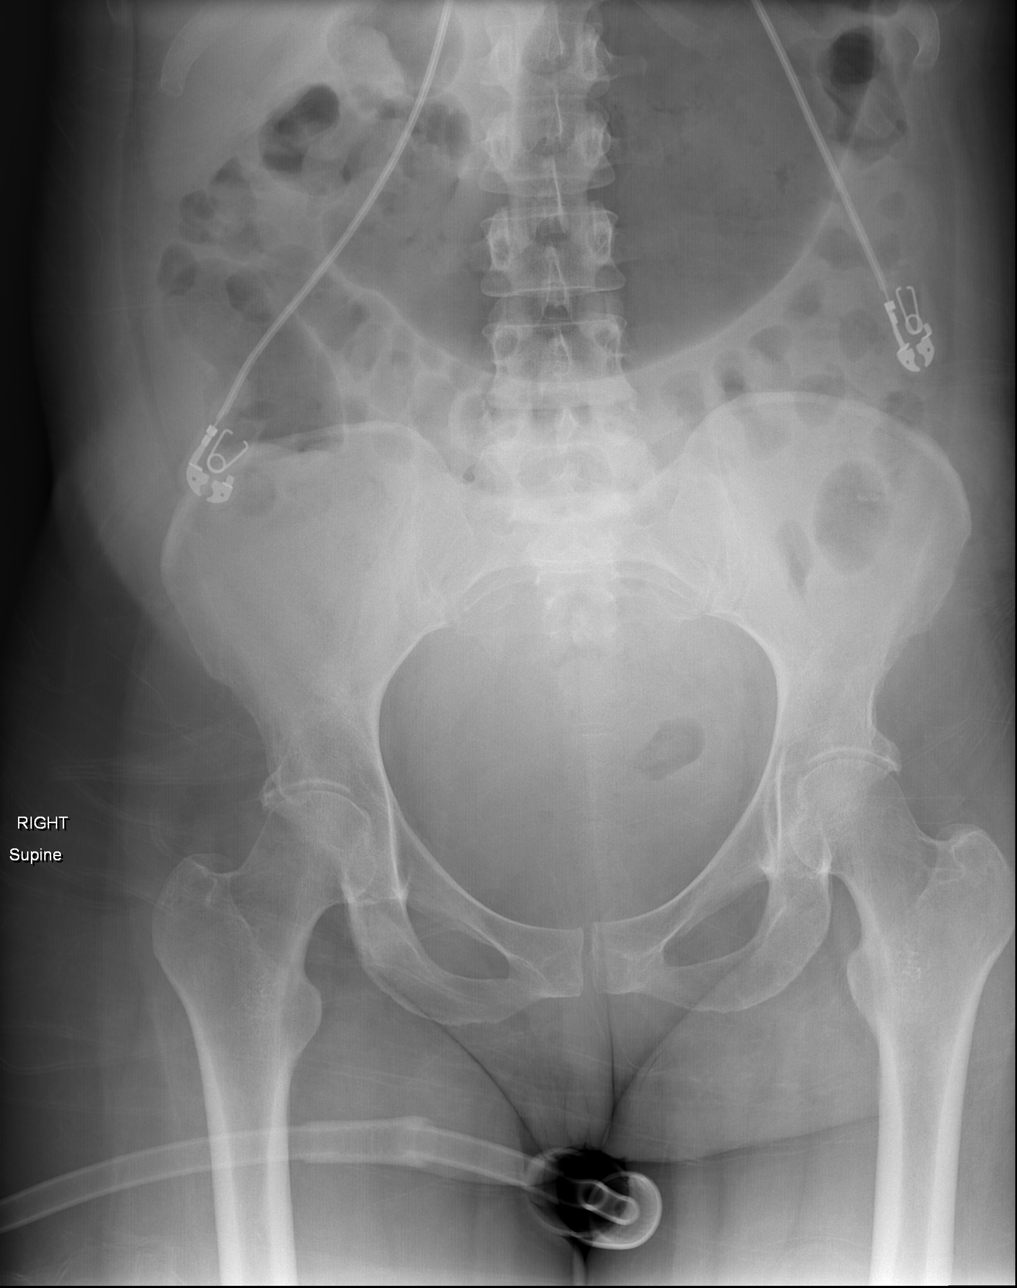

[x abdomen supine (2 of 2)]
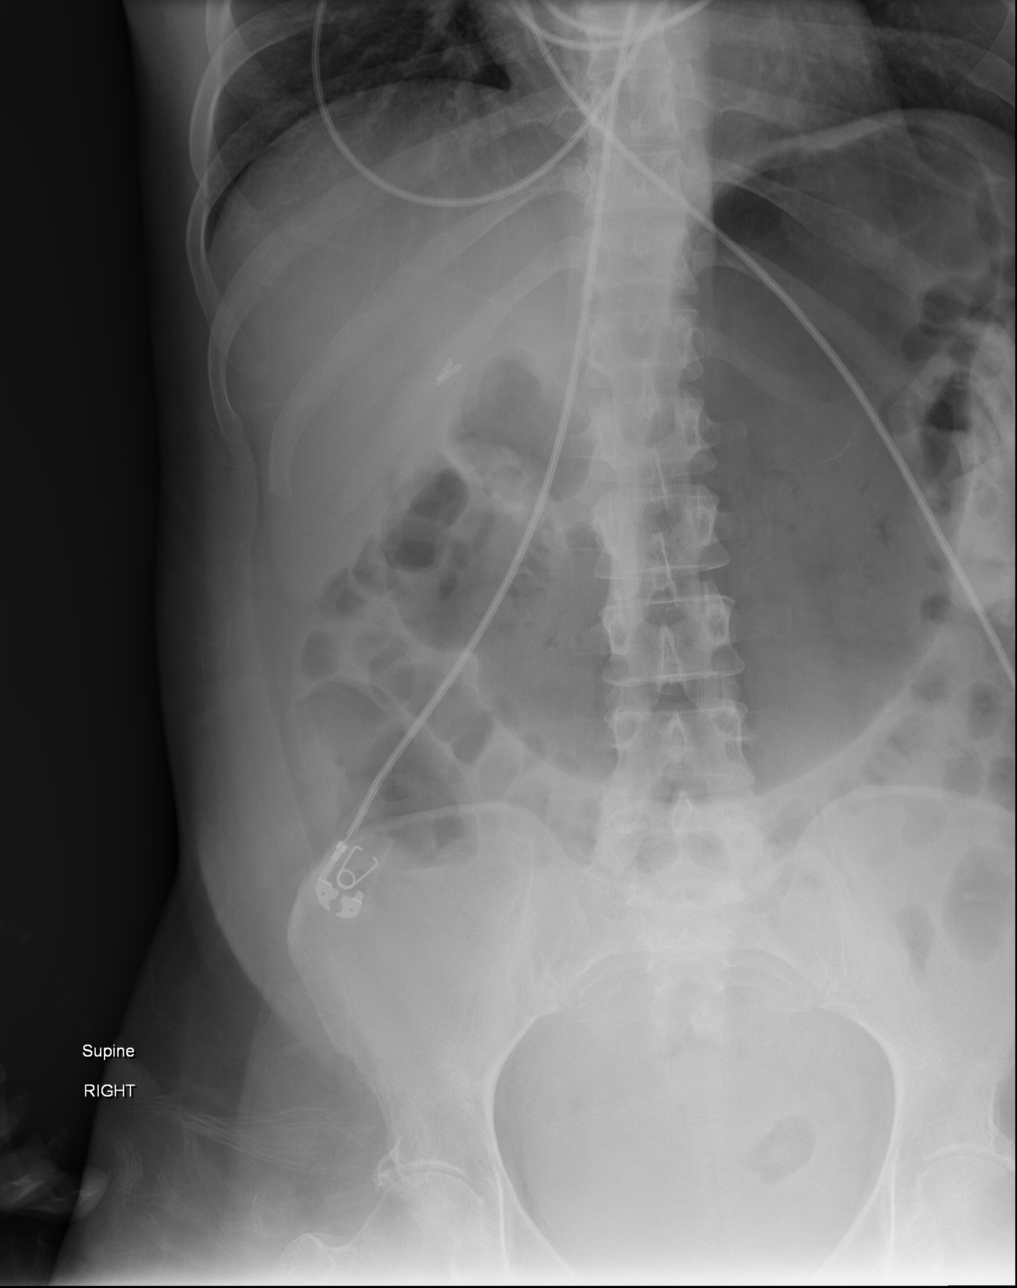

[x chest ap]
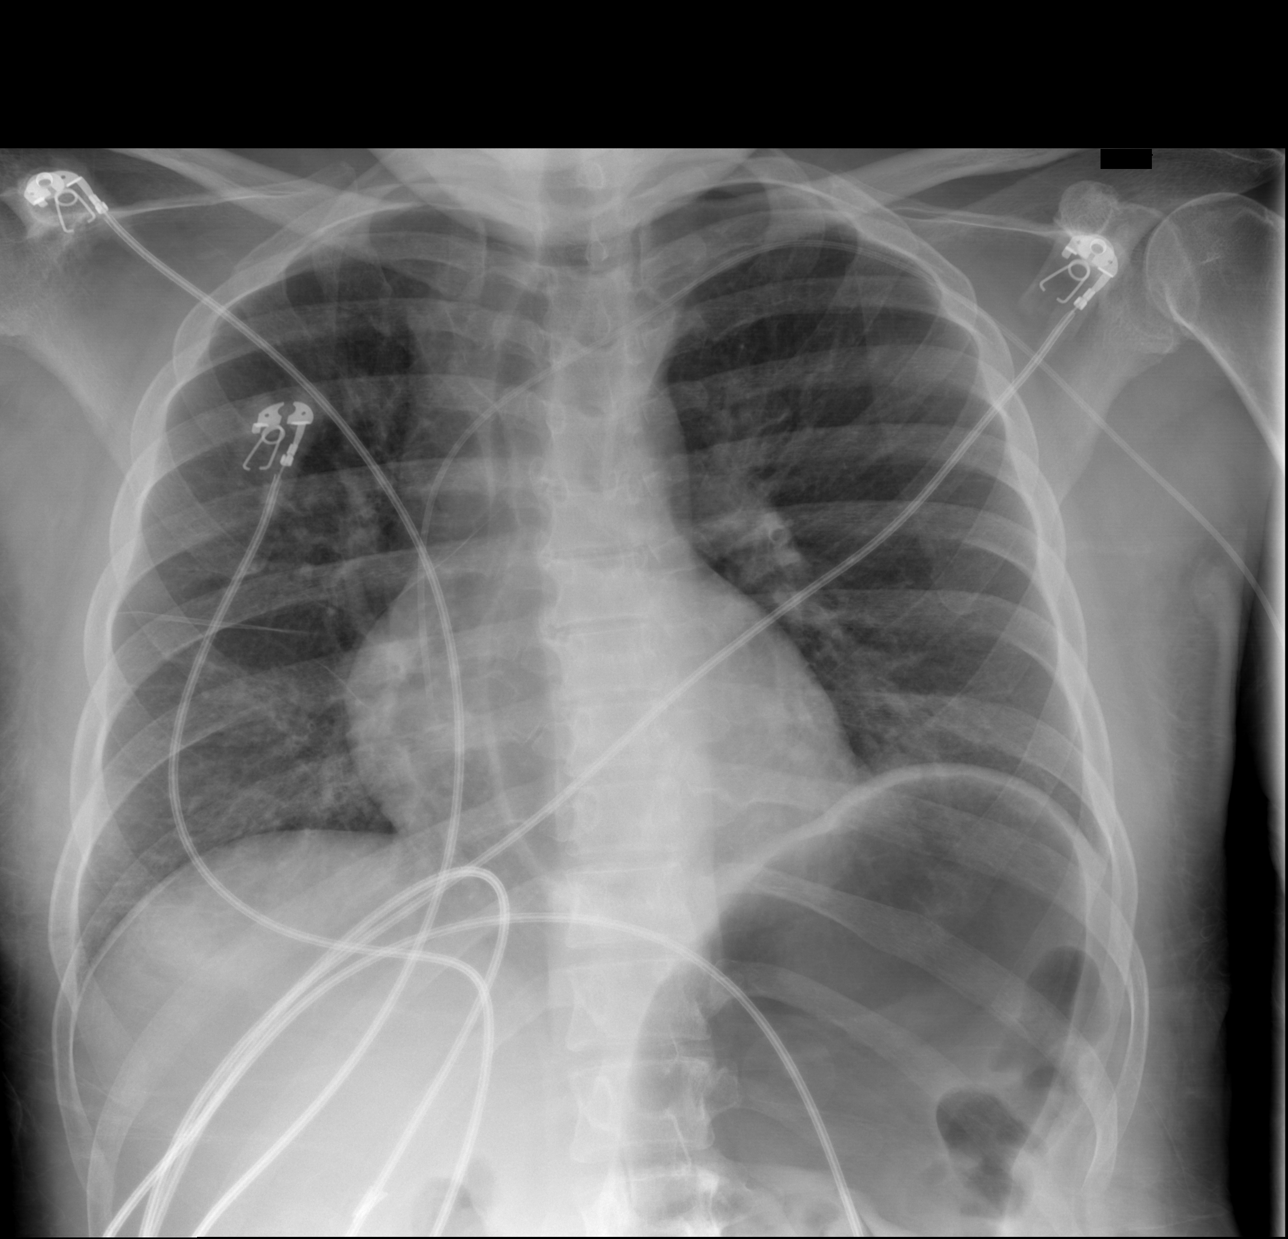

[x abdomen erect]
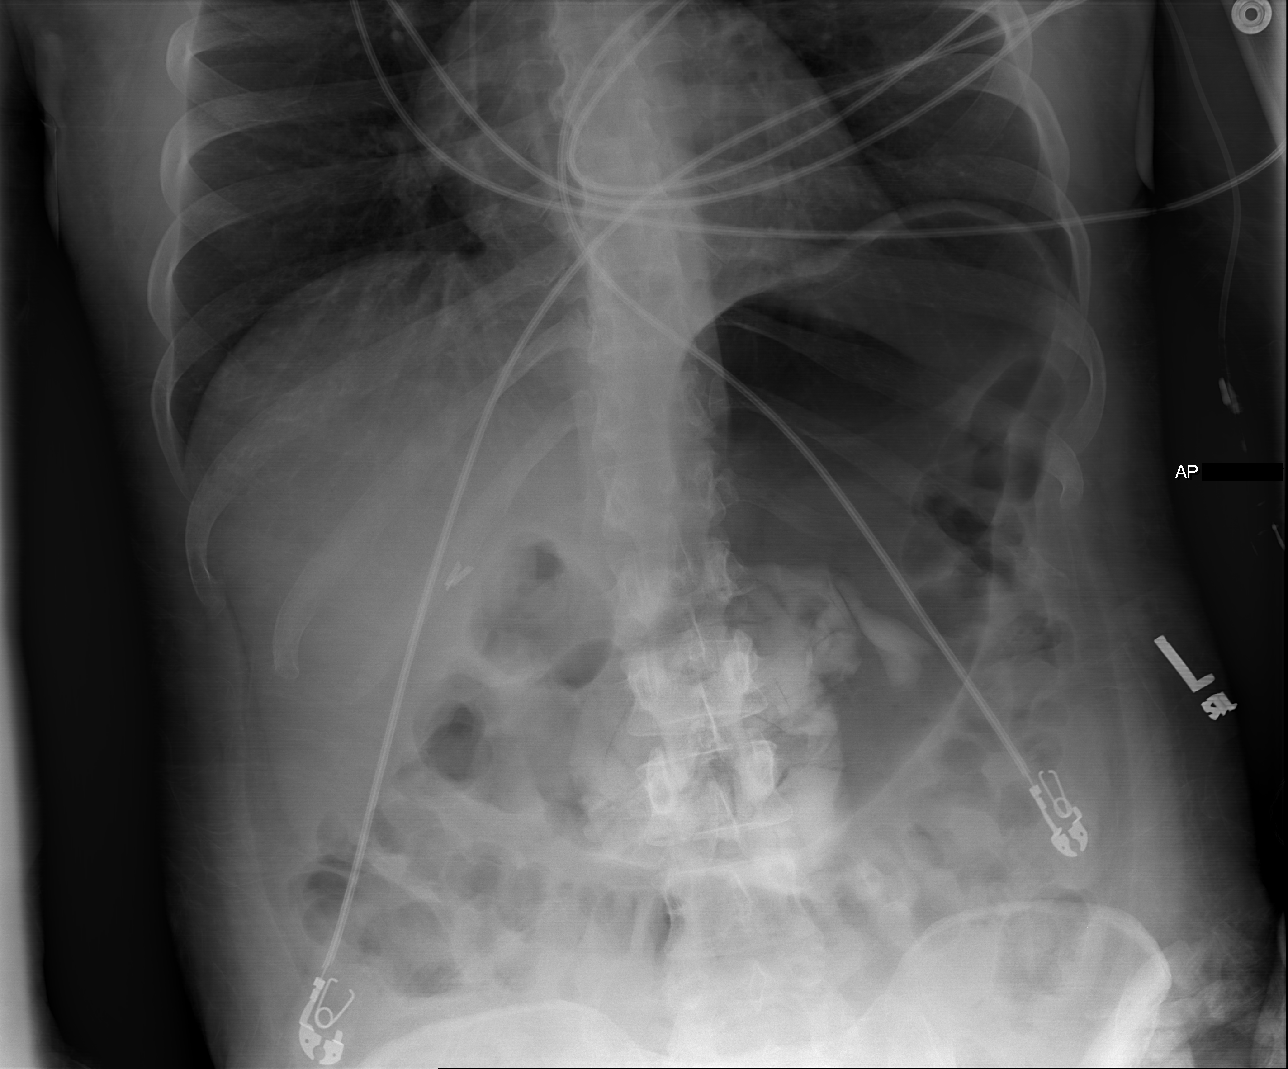

[4 of 4 positions shown; findings below may reference images not displayed]

FINDINGS: Single-view of the chest demonstrates clear lungs and normal heart
size. No pneumothorax or pleural effusion.

Two views of the abdomen show no free intraperitoneal air. There is
gaseous distention of the stomach. Small and large bowel are
unremarkable. No unexpected abdominal calcification or focal bony
abnormality.
IMPRESSION: Gaseous distention of the stomach may be related to the patient's
history of diabetic gastroparesis. The examination is otherwise
negative.

## 2018-10-09 IMAGING — XA IR PICC >5YO
1 series · 1 of 1 positions shown · non-contrast
Comparison: none

INDICATION: 51-year-old female with a history of DKA

[Series 300: line placements · 1 of 1 slices shown]
[im 1/1]
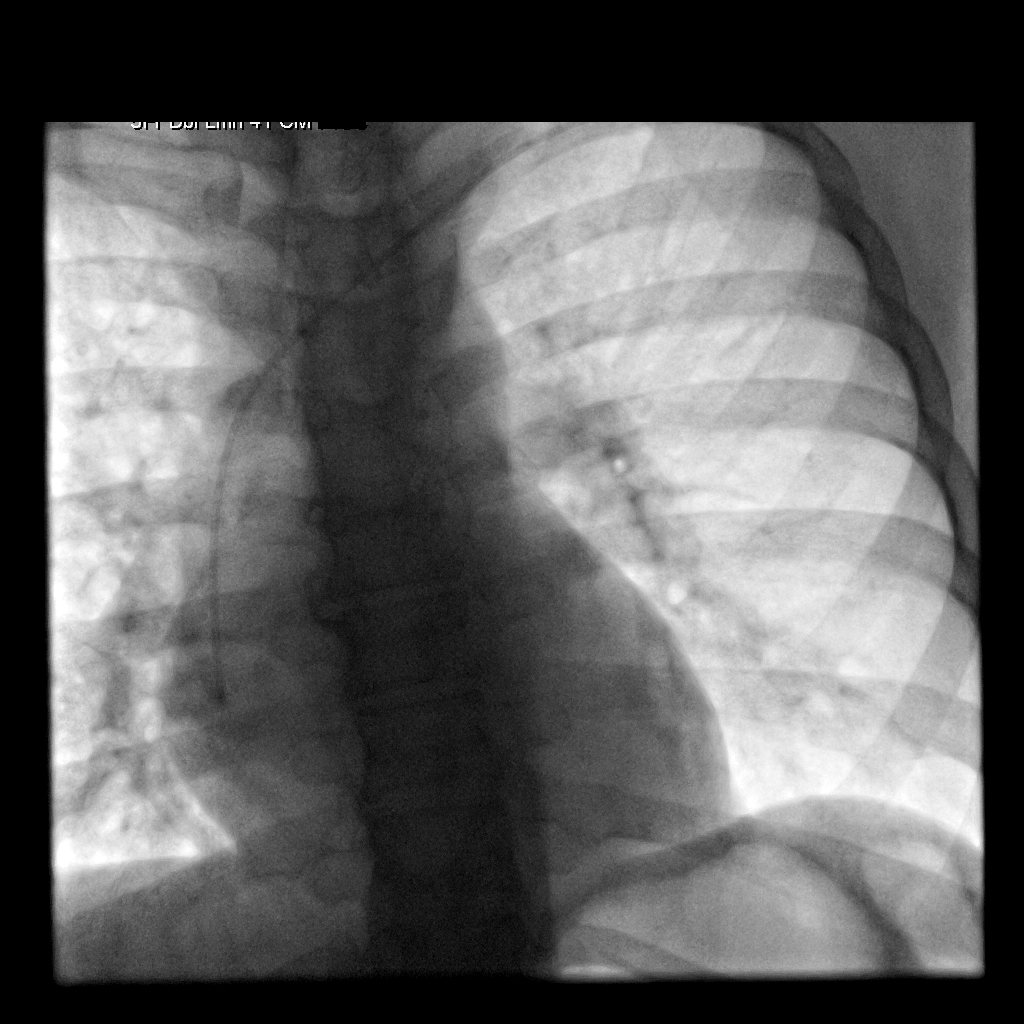

[1 of 1 positions shown; findings below may reference images not displayed]

EXAM:
PICC LINE PLACEMENT WITH ULTRASOUND AND FLUOROSCOPIC GUIDANCE

MEDICATIONS:
None

ANESTHESIA/SEDATION:
None

FLUOROSCOPY TIME:  Fluoroscopy Time: 0 minutes 42 seconds (1.9 mGy).

COMPLICATIONS:
None

PROCEDURE:
Informed written consent was obtained from the patient after a
thorough discussion of the procedural risks, benefits and
alternatives. All questions were addressed. Maximal Sterile Barrier
Technique was utilized including caps, mask, sterile gowns, sterile
gloves, sterile drape, hand hygiene and skin antiseptic. A timeout
was performed prior to the initiation of the procedure.

Patient was position in the supine position on the fluoroscopy table
with the right arm abducted 90 degrees. Ultrasound survey of the
upper extremity was performed with images stored and sent to PACs.

The left brachial vein was selected for access.

Once the patient was prepped and draped in the usual sterile
fashion, the skin and subcutaneous tissues were generously
infiltrated with 1% lidocaine for local anesthesia.

A micropuncture access kit was then used to access the targeted
vein. Wire was passed centrally, confirmed to be within the venous
system under fluoroscopy. A small stab incision was made with an 11
blade scalpel and the sheath was then placed over the wire.
Estimated length of the catheter was then performed with the
indwelling wire.

Catheter was amputated at 41 cm length and placed with coaxial wire
through the peel-away.

Double-lumen, power injectable PICC in the left brachial vein. Tip
confirmed at the cavoatrial junction, and the catheter is ready for
use.

Stat lock was placed.

Patient tolerated the procedure well and remained hemodynamically
stable throughout.

No complications were encountered and no significant blood loss was
encountered.
IMPRESSION: Status post left upper extremity PICC.

PLAN:
Anticipate future PICC placement to be difficult, with limited
venous access and small caliber veins.

## 2018-10-09 NOTE — Telephone Encounter (Signed)
Attempted to contact patient # [phone] to inform her that as per Dr Margarita Rana, she needs to contact her endocrinologist who now manages her diabetes mellitus to prevent confusion with dosing.

## 2018-10-09 NOTE — Telephone Encounter (Signed)
Call received from patient and advised her as per Dr Margarita Rana to contact her endocrinologist who manages her diabetes regarding her insulins and dosing. She said that she understood

## 2018-10-10 ENCOUNTER — Ambulatory Visit (HOSPITAL_COMMUNITY): Payer: Self-pay

## 2018-10-10 ENCOUNTER — Other Ambulatory Visit: Payer: Self-pay

## 2018-10-10 ENCOUNTER — Other Ambulatory Visit: Payer: Medicare HMO | Admitting: *Deleted

## 2018-10-10 DIAGNOSIS — I10 Essential (primary) hypertension: Secondary | ICD-10-CM

## 2018-10-11 LAB — BASIC METABOLIC PANEL
BUN/Creatinine Ratio: 10 (ref 9–23)
BUN: 12 mg/dL (ref 6–24)
CO2: 16 mmol/L — ABNORMAL LOW (ref 20–29)
Calcium: 9.2 mg/dL (ref 8.7–10.2)
Chloride: 102 mmol/L (ref 96–106)
Creatinine, Ser: 1.25 mg/dL — ABNORMAL HIGH (ref 0.57–1.00)
GFR calc Af Amer: 58 mL/min/{1.73_m2} — ABNORMAL LOW (ref >59)
GFR calc non Af Amer: 50 mL/min/{1.73_m2} — ABNORMAL LOW (ref >59)
Glucose: 173 mg/dL — ABNORMAL HIGH (ref 65–99)
Potassium: 4.7 mmol/L (ref 3.5–5.2)
Sodium: 136 mmol/L (ref 134–144)

## 2018-10-13 ENCOUNTER — Ambulatory Visit (HOSPITAL_COMMUNITY): Payer: Self-pay

## 2018-10-13 ENCOUNTER — Telehealth: Payer: Self-pay | Admitting: Endocrinology

## 2018-10-13 NOTE — Telephone Encounter (Signed)
MEDICATION:   insulin aspart (NOVOLOG) 100 UNIT/ML injection   Lantus - is covered by insurance  PHARMACY:  [id number] [place], [place] - [address]  IS THIS A 90 DAY SUPPLY :   IS PATIENT OUT OF MEDICATION:   IF NOT; HOW MUCH IS LEFT: approx. 4 days  LAST APPOINTMENT DATE: @3 /30/2020  NEXT APPOINTMENT DATE:@4 /28/2020  DO WE HAVE YOUR PERMISSION TO LEAVE A DETAILED MESSAGE:  OTHER COMMENTS:    **Let patient know to contact pharmacy at the end of the day to make sure medication is ready. **  ** Please notify patient to allow 48-72 hours to process**  **Encourage patient to contact the pharmacy for refills or they can request refills through [facility]**

## 2018-10-14 ENCOUNTER — Other Ambulatory Visit: Payer: Self-pay

## 2018-10-14 MED ORDER — INSULIN ASPART 100 UNIT/ML ~~LOC~~ SOLN: 0.0000 [IU] | Freq: Three times a day (TID) | SUBCUTANEOUS | 0 refills | Status: DC

## 2018-10-14 MED ORDER — INSULIN GLARGINE 100 UNIT/ML SOLOSTAR PEN: PEN_INJECTOR | SUBCUTANEOUS | 3 refills | Status: DC

## 2018-10-14 NOTE — Telephone Encounter (Signed)
Rx for Novolog sent. Pt stated that Levemir was no longer covered under insurance plan but Lantus was. lantus sent in place of Levemir.

## 2018-10-15 ENCOUNTER — Ambulatory Visit (HOSPITAL_COMMUNITY): Payer: Self-pay

## 2018-10-15 ENCOUNTER — Telehealth (HOSPITAL_COMMUNITY): Payer: Self-pay | Admitting: *Deleted

## 2018-10-15 NOTE — Telephone Encounter (Signed)
Called and spoke to pt regarding Cardiac Rehab continued closure due to adherence of national recommendation for Covid-19 in group setting.  Pt verbalized understanding and remains interested.  Pt is not doing any exercise mostly due to fear of going outside due to covid and not understanding limitations and stop sign for exercise.  Will send pt educational material on exercise.  Pt feels she has a good understanding of heart healthy eating with diabetes modifications.  Pt states her HgA1C had improved.  Pt aware she must begin CR prior to July since her CABG was July 2019.  Cherre Huger, BSN Cardiac and Training and development officer

## 2018-10-17 ENCOUNTER — Ambulatory Visit (HOSPITAL_COMMUNITY): Payer: Self-pay

## 2018-10-20 ENCOUNTER — Ambulatory Visit (HOSPITAL_COMMUNITY): Payer: Self-pay

## 2018-10-21 ENCOUNTER — Ambulatory Visit (INDEPENDENT_AMBULATORY_CARE_PROVIDER_SITE_OTHER): Payer: Medicare HMO | Admitting: Endocrinology

## 2018-10-21 ENCOUNTER — Encounter: Payer: Self-pay | Admitting: Endocrinology

## 2018-10-21 ENCOUNTER — Telehealth: Payer: Self-pay | Admitting: Obstetrics and Gynecology

## 2018-10-21 ENCOUNTER — Other Ambulatory Visit: Payer: Self-pay

## 2018-10-21 DIAGNOSIS — E1065 Type 1 diabetes mellitus with hyperglycemia: Secondary | ICD-10-CM

## 2018-10-21 IMAGING — DX DG CHEST 1V PORT
1 series · 1 of 1 positions shown · non-contrast
Comparison: December 23, 2017

CLINICAL DATA: History of diabetes.  Tachypnea.

EXAM:
PORTABLE CHEST 1 VIEW

[chest ap]
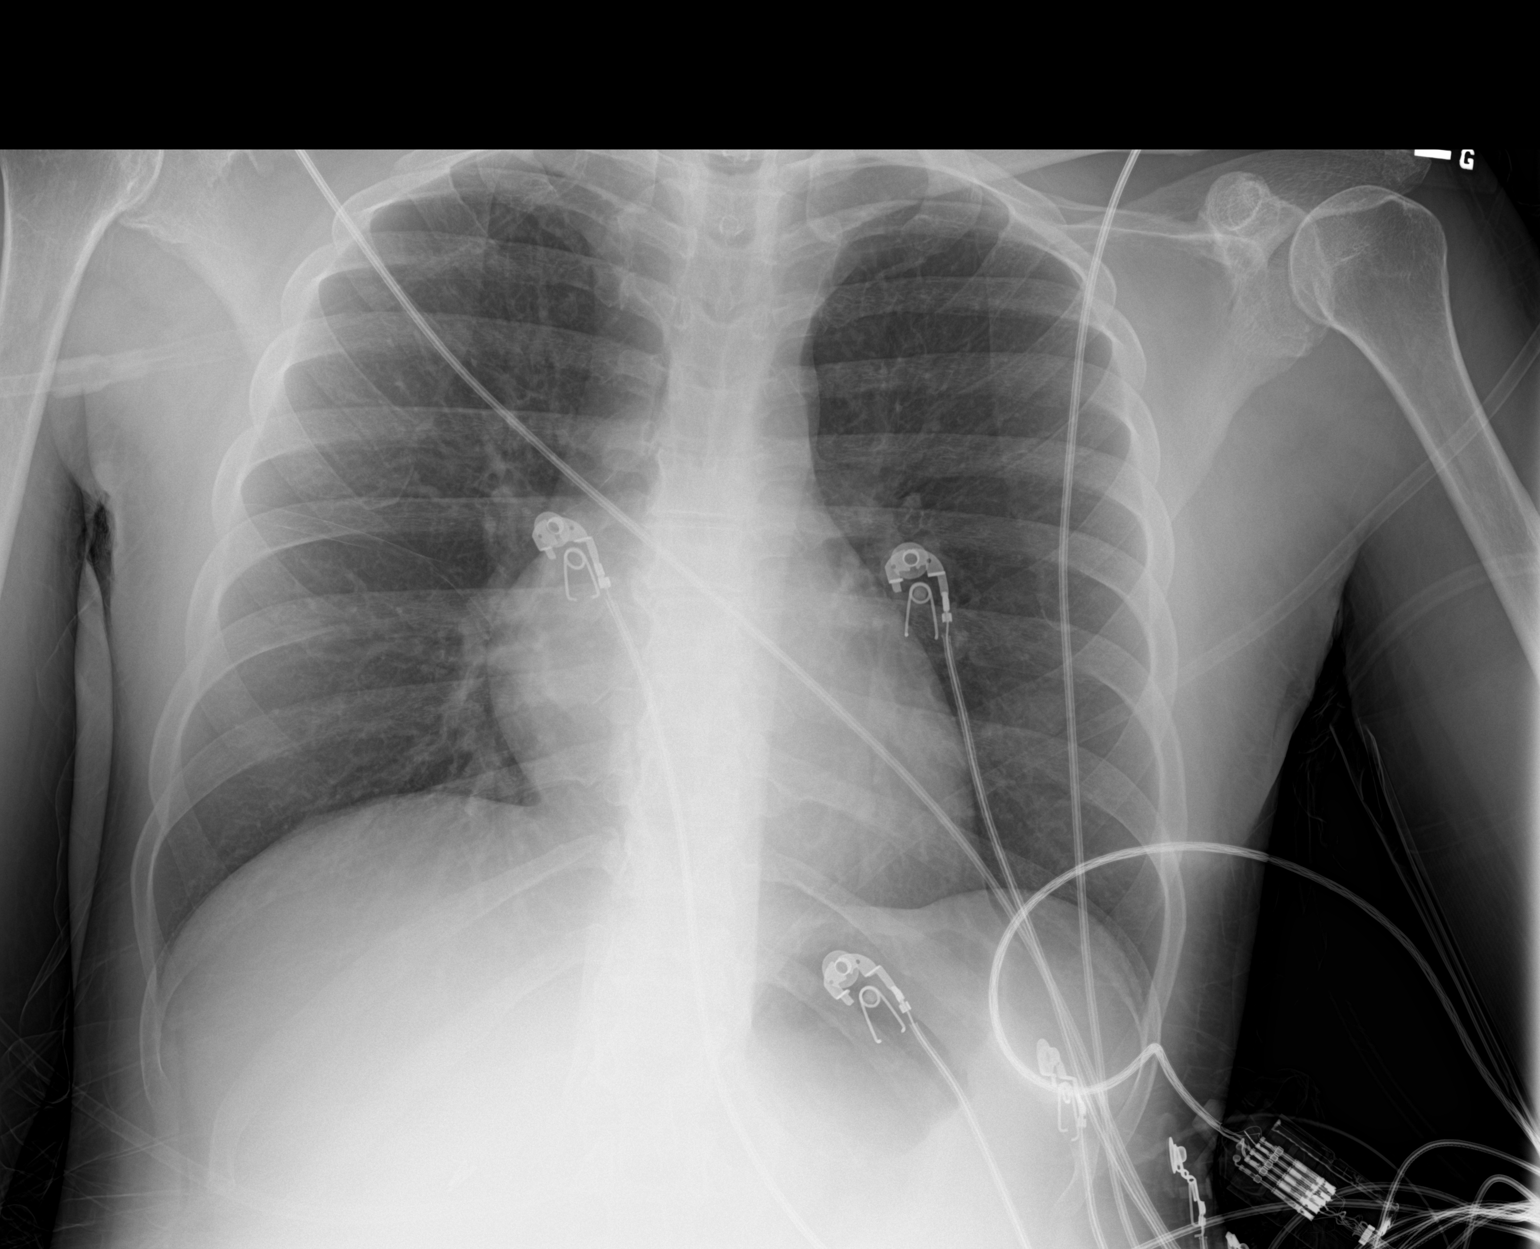

[1 of 1 positions shown; findings below may reference images not displayed]

FINDINGS: The heart size and mediastinal contours are within normal limits.
Both lungs are clear. The visualized skeletal structures are
unremarkable.
IMPRESSION: No active disease.

## 2018-10-21 NOTE — Telephone Encounter (Signed)
Called and left a message for patient to call back to schedule a new patient doctor referral appointment with our office to see ANY PROVIDER re: abnormal menses and iron deficiency.

## 2018-10-21 NOTE — Progress Notes (Signed)
Patient ID: [name], female   DOB: 02/28/1966, 53 y.o.   MRN: [id number]           Reason for Appointment : Follow-up for Type 1 Diabetes  History of Present Illness   Referring HCP: Dr. Margarita Rana   Today's office visit was provided via telemedicine using video technique Explained to the patient and the the limitations of evaluation and management by telemedicine and the availability of in person appointments.  The patient understood the limitations and agreed to proceed. Patient also understood that the telehealth visit is billable. . Location of the patient: Home . Location of the provider: Office Only the patient and myself were participating in the encounter            Diagnosis: Type 1 diabetes mellitus, date of diagnosis: 1977         Previous history:  She has mostly been followed by primary care physicians for her diabetes with consistently poor control She has had periodic episodes of ketoacidosis  Recent history:   INSULIN regimen: BASAL insulin: LANTUS 15 units in a.m.  MEALTIME insulin: Novolog variable doses: Usually 6-8 at breakfast, 2-3 at lunch and dinner if any  Her last A1c was 8.1 and she has not had a recent level  She had not come back to the office for follow-up since 11/19 after her initial consultation  Current management, blood sugar patterns and problems identified:    She is still using syringes for her insulin as she cannot afford insulin pens  She was seen by the diabetes educator in 11/19 but she still does not understand covering her meals with NovoLog  She is taking random doses of NovoLog and mostly at breakfast and is inconsistent about how much she is actually taking  She is  She is complaining about doing frequent injections and likely does not cover her meals consistently  With switching from Levemir to Lantus her morning sugars are about the same although appear to be recently variable  She is still not comfortable  counting carbohydrates  Blood sugars at lunch and dinnertime are mostly high with only occasional good readings and mostly close to 200 at lunchtime  She will not take any NovoLog if her blood sugar is in the normal range  Yesterday she had some donuts in between breakfast and lunch but did not take any coverage causing her sugar to be 400+     Glucose monitoring:  is being done 3 times a day         Glucometer: Accu-Chek      Blood Glucose readings from patient review of her monitor:   PRE-MEAL Fasting Lunch Dinner Bedtime Overall  Glucose range:  67-197  79-402  117-222 ?   Mean/median:     ?    Hypoglycemia:  None recently Symptoms of hypoglycemia: Weakness, sometimes none Treatment of hypoglycemia: Snacks         Self-care: The diet that the patient has been following is: None  Mealtimes are: Breakfast egg, cheese, sausage, occasionally grits, toast;   Lunch: Sandwich or only snacks, dinner: baked chricken, rice, potatoes                Dietician consultation: Most recent: years ago.         CDE consultation: 04/2018  Wt Readings from Last 3 Encounters:  07/16/18 147 lb 3.2 oz (66.8 kg)  06/12/18 141 lb (64 kg)  06/03/18 135 lb (61.2 kg)   Diabetes labs:  Lab  Results  Component Value Date   HGBA1C 8.1 (A) 04/28/2018   HGBA1C 8.2 (H) 02/19/2018   HGBA1C 8.6 (H) 01/08/2018   Lab Results  Component Value Date   MICROALBUR 14.3 (H) 04/28/2018   LDLCALC 54 01/07/2018   CREATININE 1.25 (H) 10/10/2018    Lab Results  Component Value Date   MICRALBCREAT 17.1 04/28/2018   MICRALBCREAT 254 (H) 09/04/2016     Allergies as of 10/21/2018      Reactions   Anesthetics, Amide Nausea And Vomiting   Penicillins Diarrhea, Nausea And Vomiting, Other (See Comments)   Has patient had a PCN reaction causing immediate rash, facial/tongue/throat swelling, SOB or lightheadedness with hypotension: Yes Has patient had a PCN reaction causing severe rash involving mucus membranes or  skin necrosis: No Has patient had a PCN reaction that required hospitalization No Has patient had a PCN reaction occurring within the last 10 years: Yes  If all of the above answers are "NO", then may proceed with Cephalosporin use.   Buprenorphine Hcl Rash   Encainide Nausea And Vomiting   Metoclopramide Other (See Comments)   Dystonia, muscle rigidity.  Patient states she is only allergic to the "pill form, not the IV"      Medication List       Accurate as of October 21, 2018  9:37 AM. Always use your most recent med list.        ammonium lactate 12 % cream Commonly known as:  AMLACTIN Apply topically as needed for dry skin.   aspirin EC 81 MG tablet Take 1 tablet (81 mg total) by mouth daily.   busPIRone 10 MG tablet Commonly known as:  BUSPAR Take 1 tablet (10 mg total) by mouth 3 (three) times daily.   diphenhydrAMINE 25 mg capsule Commonly known as:  BENADRYL Take 25 mg by mouth every 6 (six) hours as needed for allergies.   docusate sodium 100 MG capsule Commonly known as:  Colace Take 1 capsule (100 mg total) by mouth daily as needed for mild constipation.   furosemide 40 MG tablet Commonly known as:  LASIX Take 1 tablet (40 mg total) by mouth daily as needed for fluid or edema.   glucose blood test strip Commonly known as:  Accu-Chek Aviva Plus Use as instructed to test blood sugar 5 times daily Dx E10.65   insulin aspart 100 UNIT/ML injection Commonly known as:  novoLOG Inject 0-12 Units into the skin 3 (three) times daily before meals. As per sliding scale   Insulin Glargine 100 UNIT/ML Solostar Pen Commonly known as:  Lantus SoloStar Inject 15 units under the skin once daily in the morning.   Insulin Syringe-Needle U-100 31G X 5/16" 0.5 ML Misc Commonly known as:  BD Insulin Syringe Ultrafine 1 each by Does not apply route 4 (four) times daily.   loperamide 2 MG capsule Commonly known as:  IMODIUM Take 1 capsule (2 mg total) by mouth as needed for  diarrhea or loose stools.   losartan 25 MG tablet Commonly known as:  COZAAR Take 1 tablet (25 mg total) by mouth daily.   metoprolol tartrate 25 MG tablet Commonly known as:  LOPRESSOR Take 0.5 tablets (12.5 mg total) by mouth 2 (two) times daily.   OneTouch Delica Plus [id number] Misc 1 Units by Does not apply route as directed.   OneTouch Verio w/Device Kit Use as directed   pantoprazole 40 MG tablet Commonly known as:  PROTONIX Take 1 tablet (40 mg total) by mouth  daily.   potassium chloride 10 MEQ tablet Commonly known as:  K-DUR Take 1 tablet (10 mEq total) by mouth daily.   pregabalin 150 MG capsule Commonly known as:  Lyrica Take 1 capsule (150 mg total) by mouth 2 (two) times daily.   promethazine 12.5 MG tablet Commonly known as:  PHENERGAN Take 1 tablet (12.5 mg total) by mouth every 6 (six) hours as needed for nausea or vomiting.   pyridostigmine 60 MG tablet Commonly known as:  MESTINON Take 60 mg by mouth 3 (three) times daily.       Allergies:  Allergies  Allergen Reactions  . Anesthetics, Amide Nausea And Vomiting  . Penicillins Diarrhea, Nausea And Vomiting and Other (See Comments)    Has patient had a PCN reaction causing immediate rash, facial/tongue/throat swelling, SOB or lightheadedness with hypotension: Yes Has patient had a PCN reaction causing severe rash involving mucus membranes or skin necrosis: No Has patient had a PCN reaction that required hospitalization No Has patient had a PCN reaction occurring within the last 10 years: Yes  If all of the above answers are "NO", then may proceed with Cephalosporin use.   . Buprenorphine Hcl Rash  . Encainide Nausea And Vomiting  . Metoclopramide Other (See Comments)    Dystonia, muscle rigidity.  Patient states she is only allergic to the "pill form, not the IV"    Past Medical History:  Diagnosis Date  . AKI (acute kidney injury) ([place])   . Anemia, iron deficiency   . Anxiety and  depression 05/18/2015  . CAD (coronary artery disease), native coronary artery 01/11/2018   Non-STEMI with severe three-vessel disease noted July 2019  . Depression   . Diabetes mellitus without complication ([place])   . Diabetic gastroparesis ([place])   . Diabetic neuropathy, type I diabetes mellitus ([place]) 05/18/2015  . DKA (diabetic ketoacidoses) ([place]) 06/16/2017  . DKA, type 1 ([place]) 03/18/2016  . Essential hypertension   . Gastroparesis   . GERD (gastroesophageal reflux disease)   . HLD (hyperlipidemia)   . MDD (major depressive disorder), single episode, severe , no psychosis ([place])   . Non-ST elevation (NSTEMI) myocardial infarction ([place])   . S/P CABG x 3 01/14/2018  . Tardive dyskinesia   . Vitamin B12 deficiency 08/16/2015    Past Surgical History:  Procedure Laterality Date  . CARDIAC SURGERY    . COLONOSCOPY  09/27/2014   at [facility]  . CORONARY ARTERY BYPASS GRAFT N/A 01/14/2018   Procedure: CORONARY ARTERY BYPASS GRAFTING (CABG)X3, RIGHT AND LEFT SAPHENOUS VEIN HARVEST, MAMMARY TAKE DOWN. MAMMARY TO LAD, SVG TO PD, SVG TO DISTAL CIRC.;  Surgeon: Grace Isaac, MD;  Location: [facility];  Service: Open Heart Surgery;  Laterality: N/A;  . ESOPHAGOGASTRODUODENOSCOPY  09/27/2014   at [facility], Dr Rolan Lipa. biospy neg for celiac, neg for H pylori.   . EYE SURGERY    . gailstones    . IR FLUORO GUIDE CV LINE RIGHT  02/01/2017  . IR FLUORO GUIDE CV LINE RIGHT  03/06/2017  . IR FLUORO GUIDE CV LINE RIGHT  03/25/2017  . IR GENERIC HISTORICAL  01/24/2016   IR FLUORO GUIDE CV LINE RIGHT 01/24/2016 Darrell K Allred, PA-C [facility]  . IR GENERIC HISTORICAL  01/24/2016   IR US GUIDE VASC ACCESS RIGHT 01/24/2016 Darrell K Allred, PA-C [facility]  . IR US GUIDE VASC ACCESS RIGHT  02/01/2017  . IR US GUIDE VASC ACCESS RIGHT  03/06/2017  . IR US GUIDE VASC  ACCESS RIGHT  03/25/2017  . LEFT HEART CATH AND CORONARY ANGIOGRAPHY N/A 01/07/2018   Procedure: LEFT HEART CATH AND CORONARY ANGIOGRAPHY;  Surgeon:  Troy Sine, MD;  Location: [place] [facility];  Service: Cardiovascular;  Laterality: N/A;  . POSTERIOR VITRECTOMY AND MEMBRANE PEEL-LEFT EYE  09/28/2002  . POSTERIOR VITRECTOMY AND MEMBRANE PEEL-RIGHT EYE  03/16/2002  . RETINAL DETACHMENT SURGERY    . TEE WITHOUT CARDIOVERSION N/A 01/14/2018   Procedure: TRANSESOPHAGEAL ECHOCARDIOGRAM (TEE);  Surgeon: Grace Isaac, MD;  Location: [facility];  Service: Open Heart Surgery;  Laterality: N/A;    Family History  Problem Relation Age of Onset  . Cystic fibrosis Mother   . Hypertension Father   . Diabetes Brother   . Hypertension Maternal Grandmother     Social History:  reports that she has never smoked. She has never used smokeless tobacco. She reports that she does not drink alcohol or use drugs.      Review of Systems        Lipids: Taking atorvastatin for history of CAD  Lab Results  Component Value Date   CHOL 121 01/07/2018   HDL 47 01/07/2018   LDLCALC 54 01/07/2018   TRIG 101 01/07/2018   CHOLHDL 2.6 01/07/2018    Last dilated eye exam was in 5/88  DIABETES COMPLICATIONS: Gastroparesis  LABS:  No visits with results within 1 Week(s) from this visit.  Latest known visit with results is:  Lab on 10/10/2018  Component Date Value Ref Range Status  . Glucose 10/10/2018 173* 65 - 99 mg/dL Final  . BUN 10/10/2018 12  6 - 24 mg/dL Final  . Creatinine, Ser 10/10/2018 1.25* 0.57 - 1.00 mg/dL Final  . GFR calc non Af Amer 10/10/2018 50* >59 mL/min/1.73 Final  . GFR calc Af Amer 10/10/2018 58* >59 mL/min/1.73 Final  . BUN/Creatinine Ratio 10/10/2018 10  9 - 23 Final  . Sodium 10/10/2018 136  134 - 144 mmol/L Final  . Potassium 10/10/2018 4.7  3.5 - 5.2 mmol/L Final  . Chloride 10/10/2018 102  96 - 106 mmol/L Final  . CO2 10/10/2018 16* 20 - 29 mmol/L Final  . Calcium 10/10/2018 9.2  8.7 - 10.2 mg/dL Final    Physical Examination:  There were no vitals taken for this visit.      ASSESSMENT:  Diabetes type 1,  long-standing with multiple complications  See history of present illness for detailed discussion of current diabetes management, blood sugar patterns and problems identified  Current problems identified  She is not checking her sugars after meals only before eating  She does not understand the need to take NovoLog for covering her meals and only takes it based on her pre-meal blood sugar has a correction dose  Although she thinks her blood sugars go up with all foods including long carbohydrate items she does not cover her meals  She is reluctant to take multiple injections and still is not using an insulin pen for her mealtime doses  Fasting readings are variable possibly depending on her sugars the night before but also because of using Lantus  However morning sugars may be as low as 67 even with taking only 15 units of Lantus in the morning once a day    PLAN:   1. Glucose monitoring: . She was advised to check readings more often after evening meal and sometimes 2 hours after any of her meals . Currently not eligible to get freestyle libre sensor  She does  need to come in and have her A1c rechecked  2.  Diabetes education: . Patient will need carbohydrate counting information as she is currently not aware of this  3.  Lifestyle changes: . Regular walking for exercise   4.  Insulin changes prescribed:  She will start taking NovoLog based on what she is eating in addition to her blood sugar  Most likely she will need to take at least 1 unit for each carbohydrate portion of 15 g and likely 1: 10 carbohydrate ratio but difficult to assess at this time  She will keep a record of her blood sugars, insulin and food for a week prior to her next visit  Need to set up consultation with nutritionist  She will need to switch to a NovoLog pen once she finishes her vials  She will not skip her NovoLog at any meal  She will reduce her Lantus to 14 but if fasting readings are  still low normal reduced to 13  She will switch to [place] when she is finishing her Lantus  Follow-up eye exam is needed  There are no Patient Instructions on file for this visit.  Counseling time on subjects discussed in assessment and plan sections is over 50% of today's 25 minute visit  Elayne Snare 10/21/2018, 9:37 AM     Note: This note was prepared with Dragon voice recognition system technology. Any transcriptional errors that result from this process are unintentional.

## 2018-10-22 ENCOUNTER — Ambulatory Visit (HOSPITAL_COMMUNITY): Payer: Self-pay

## 2018-10-23 IMAGING — DX DG CHEST 1V PORT
1 series · 1 of 1 positions shown · non-contrast
Comparison: 01/04/2018

CLINICAL DATA: PICC placement

EXAM:
PORTABLE CHEST 1 VIEW

[chest ap]
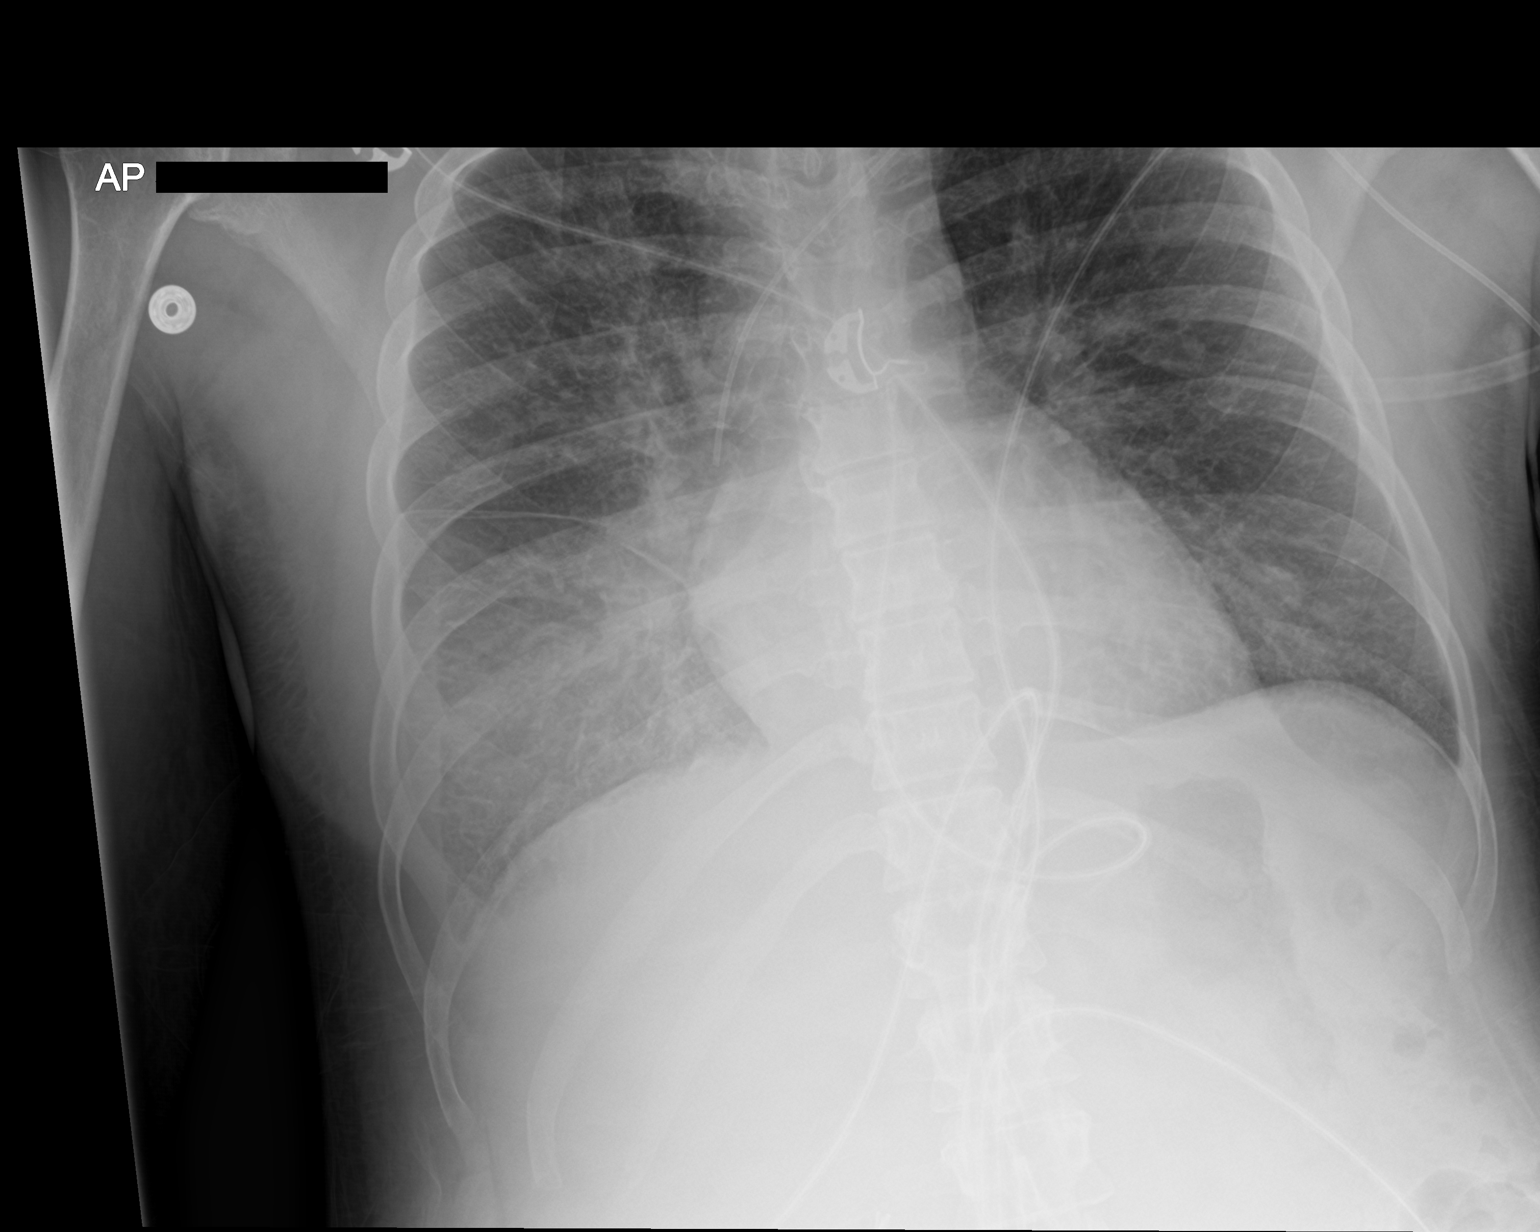

[1 of 1 positions shown; findings below may reference images not displayed]

FINDINGS: Left PICC line in place with the tip in the SVC. Patchy airspace
disease bilaterally, right greater than left. Small right effusion.
Heart is normal size.
IMPRESSION: Worsening bilateral airspace disease, right greater than left
concerning for multifocal pneumonia. Small right effusion.

Left PICC line tip in the lower SVC.

## 2018-10-24 ENCOUNTER — Ambulatory Visit (HOSPITAL_COMMUNITY): Payer: Self-pay

## 2018-10-27 ENCOUNTER — Ambulatory Visit (HOSPITAL_COMMUNITY): Payer: Self-pay

## 2018-10-29 ENCOUNTER — Ambulatory Visit (HOSPITAL_COMMUNITY): Payer: Self-pay

## 2018-10-30 IMAGING — DX DG CHEST 1V PORT
1 series · 1 of 1 positions shown · non-contrast
Comparison: 01/06/2018, 01/04/2018

CLINICAL DATA: Preop, history of diabetes and hypertension

EXAM:
PORTABLE CHEST 1 VIEW

[chest ap]
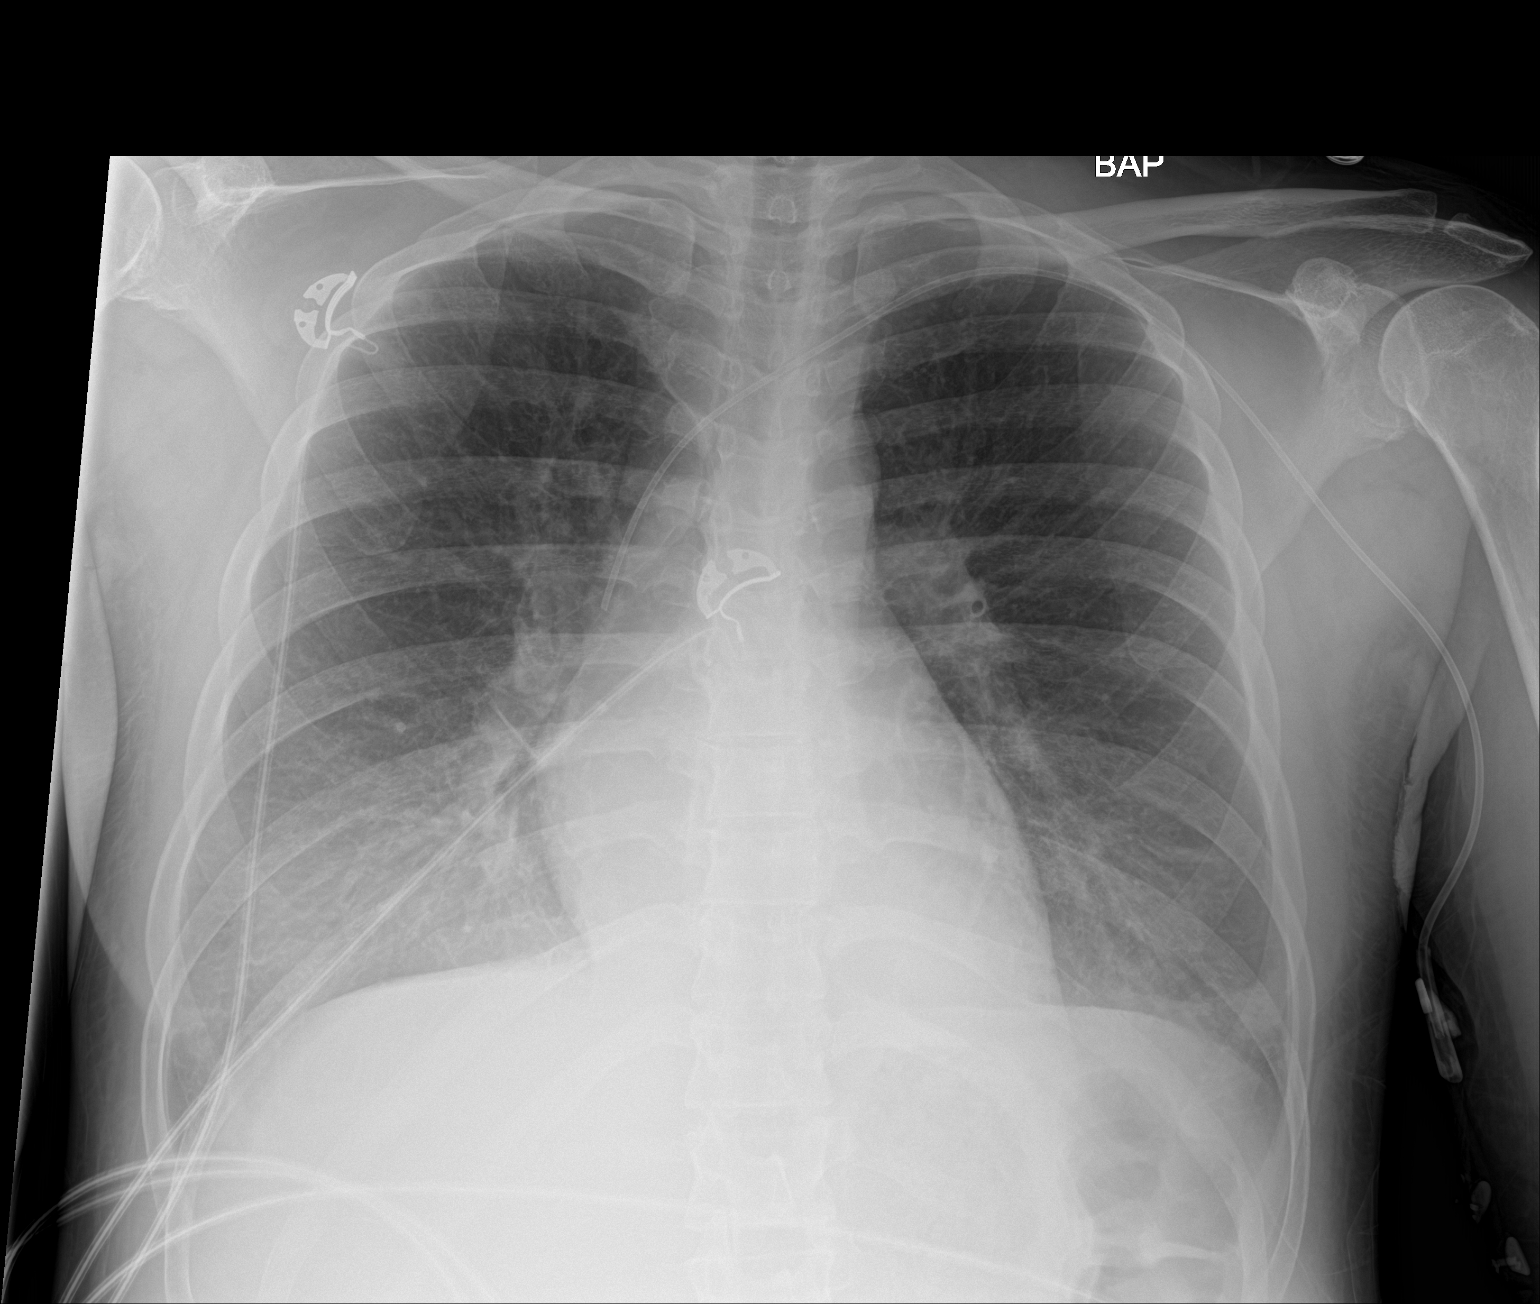

[1 of 1 positions shown; findings below may reference images not displayed]

FINDINGS: Left upper extremity catheter tip overlies the SVC. Partial but
incomplete clearing of hazy bibasilar infiltrates or edema. No large
effusion. Normal heart size. No pneumothorax.
IMPRESSION: Partial but incomplete clearing of hazy bibasilar edema or
infiltrates.

## 2018-10-31 ENCOUNTER — Ambulatory Visit (HOSPITAL_COMMUNITY): Payer: Self-pay

## 2018-10-31 IMAGING — DX DG CHEST 1V PORT
1 series · 1 of 1 positions shown · non-contrast
Comparison: 01/13/2018.

CLINICAL DATA: Chest pain.  CABG.

EXAM:
PORTABLE CHEST 1 VIEW

[chest ap]
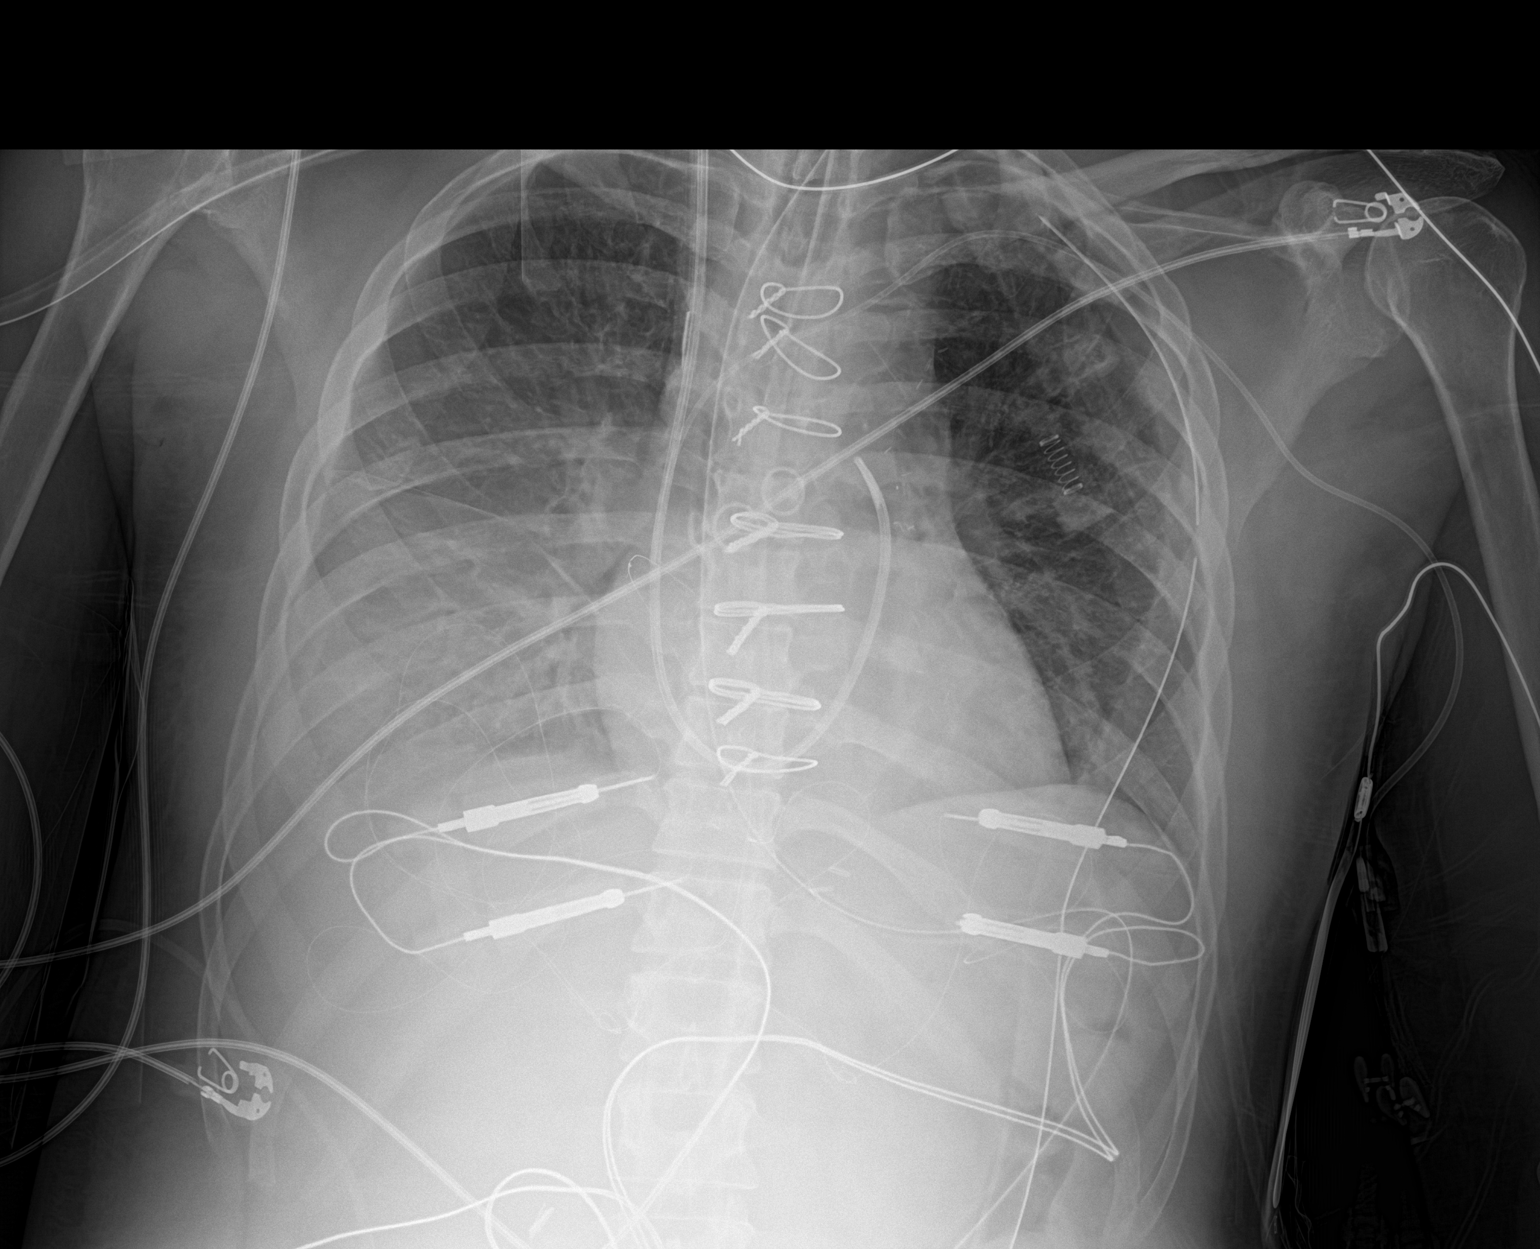

[1 of 1 positions shown; findings below may reference images not displayed]

FINDINGS: Endotracheal tube noted with tip 1.9 cm above the carina. NG tube
noted with tip below left hemidiaphragm. Swan-Ganz catheter with tip
in the pulmonary outflow tract. Left PICC line noted with tip at
cavoatrial junction. Left chest tube noted with tip over the left
upper chest. No pneumothorax. Prior CABG. Heart size normal. Right
lower lung infiltrate with right-sided pleural effusion.
IMPRESSION: 1. Prior CABG. Heart size normal. Endotracheal tube noted with tip
1.9 cm above the carina. NG tube noted with tip below left
hemidiaphragm. Swan-Ganz catheter noted with tip in the pulmonary
outflow tract. Left PICC line noted with tip at cavoatrial junction.
Left chest tube noted with tip over the left upper chest. No
pneumothorax.

2.  Right lower lung infiltrate with right-sided pleural effusion.

## 2018-11-01 IMAGING — DX DG CHEST 1V PORT
1 series · 1 of 1 positions shown · non-contrast
Comparison: 01/14/2018

CLINICAL DATA: Chest tube

EXAM:
PORTABLE CHEST 1 VIEW

[chest ap]
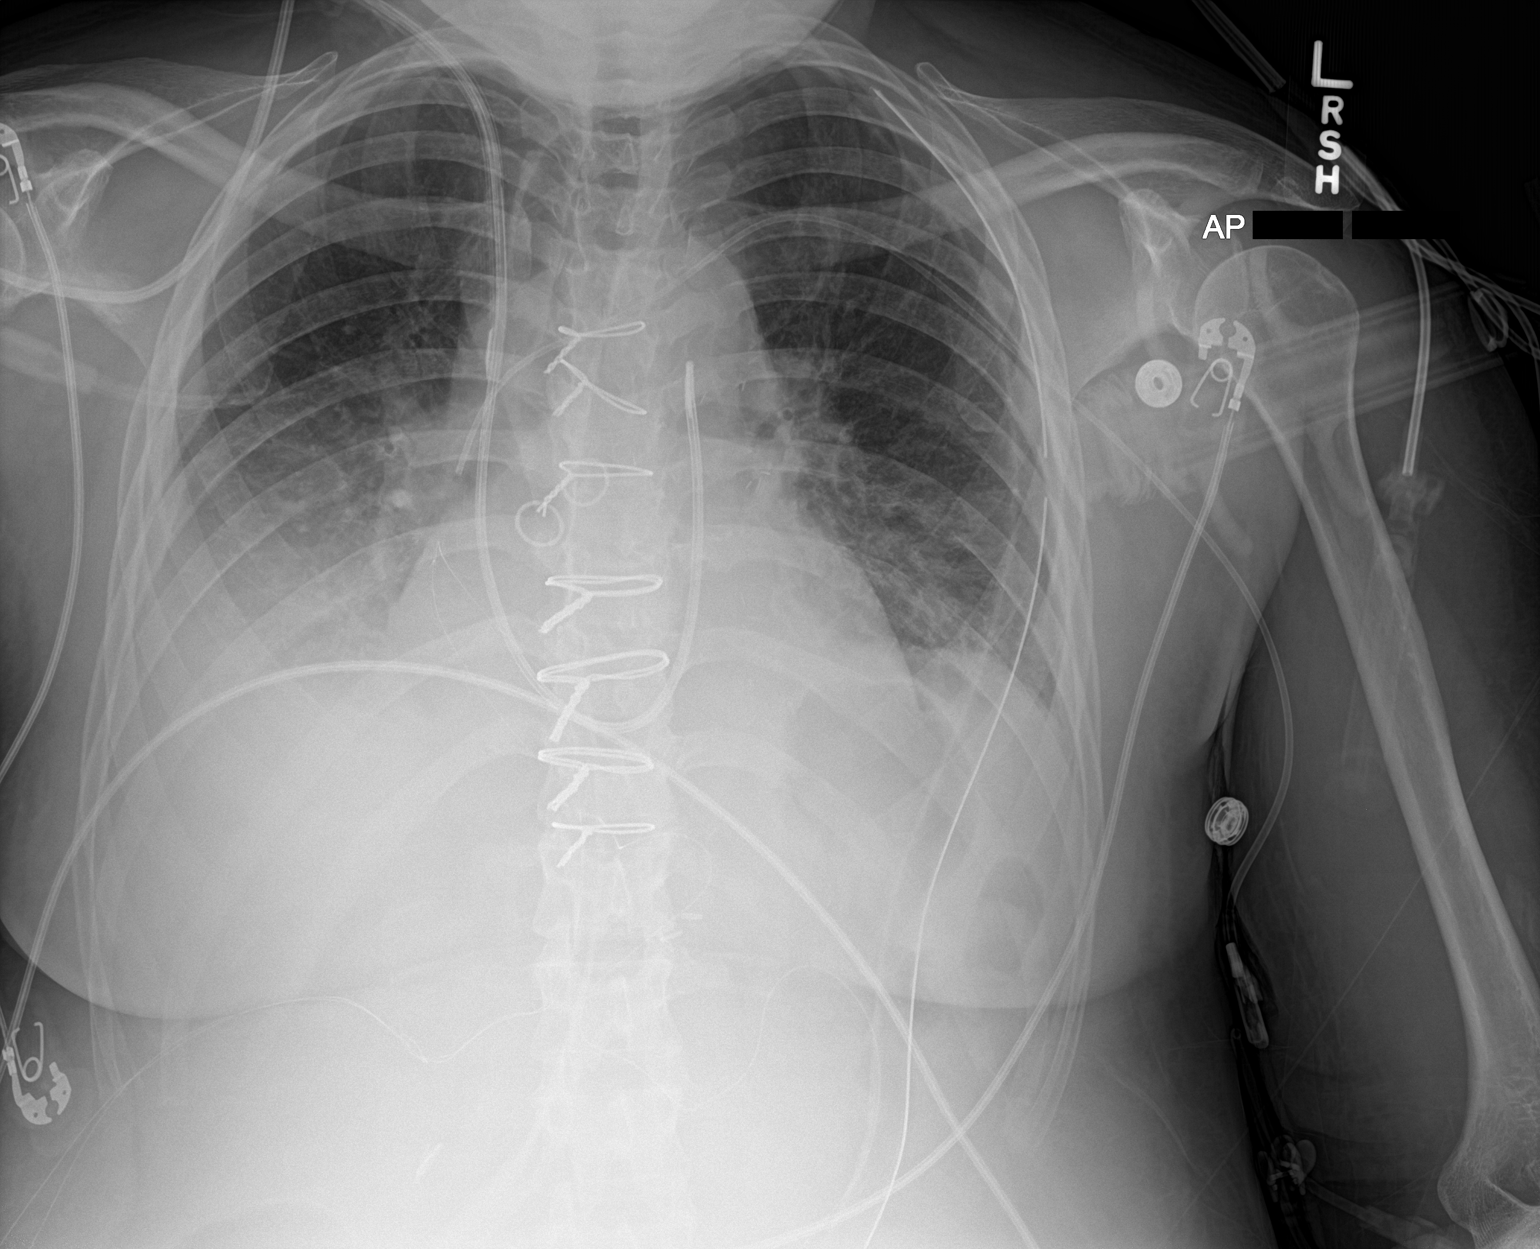

[1 of 1 positions shown; findings below may reference images not displayed]

FINDINGS: Interval removal of endotracheal tube and NG tube. Left PICC line,
Swan-Ganz catheter, left chest tube remain in place, unchanged. No
pneumothorax. Cardiomegaly with vascular congestion, bibasilar
atelectasis and bilateral effusions, right larger than left.
Bibasilar atelectasis has worsened slightly since prior study.
IMPRESSION: Interval extubation.  Increasing bibasilar atelectasis.

Cardiomegaly with vascular congestion. Bilateral effusions, right
greater than left.

## 2018-11-03 ENCOUNTER — Ambulatory Visit (HOSPITAL_COMMUNITY): Payer: Self-pay

## 2018-11-03 ENCOUNTER — Ambulatory Visit: Payer: Medicare HMO | Admitting: Obstetrics and Gynecology

## 2018-11-03 IMAGING — DX DG CHEST 1V PORT
1 series · 1 of 1 positions shown · non-contrast
Comparison: 01/16/2018

CLINICAL DATA: Follow-up pneumothorax

EXAM:
PORTABLE CHEST 1 VIEW

[chest]
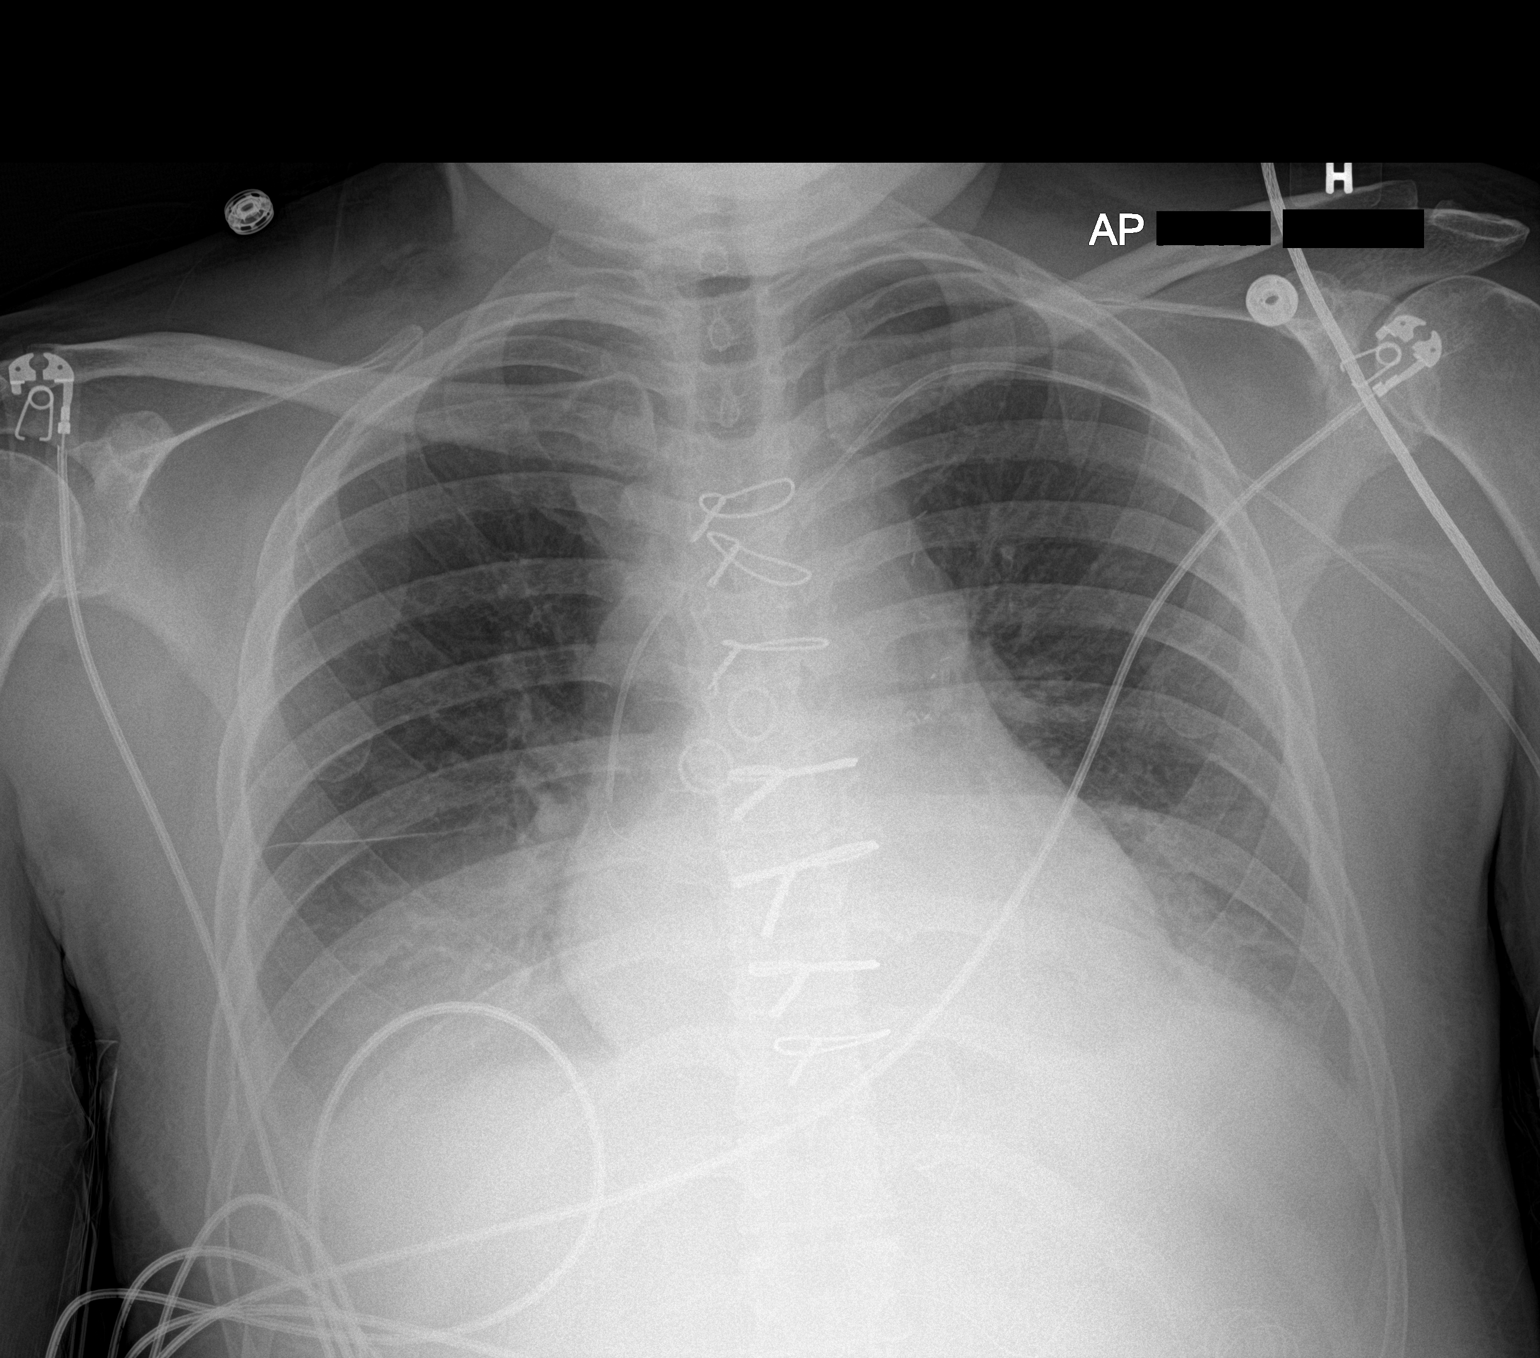

[1 of 1 positions shown; findings below may reference images not displayed]

FINDINGS: Left-sided PICC line is again seen and stable. Postoperative changes
are again noted. Right jugular sheath and left thoracostomy catheter
have been removed in the interval. Persistent bibasilar atelectatic
changes are noted. Small effusions are noted as well. No
pneumothorax is seen.
IMPRESSION: Mild bibasilar atelectasis and small effusions. No recurrent
pneumothorax following chest tube removal.

## 2018-11-04 IMAGING — DX DG CHEST 1V PORT
1 series · 1 of 1 positions shown · non-contrast
Comparison: 01/17/2018

CLINICAL DATA: Follow-up pleural effusion

EXAM:
PORTABLE CHEST 1 VIEW

[chest ap]
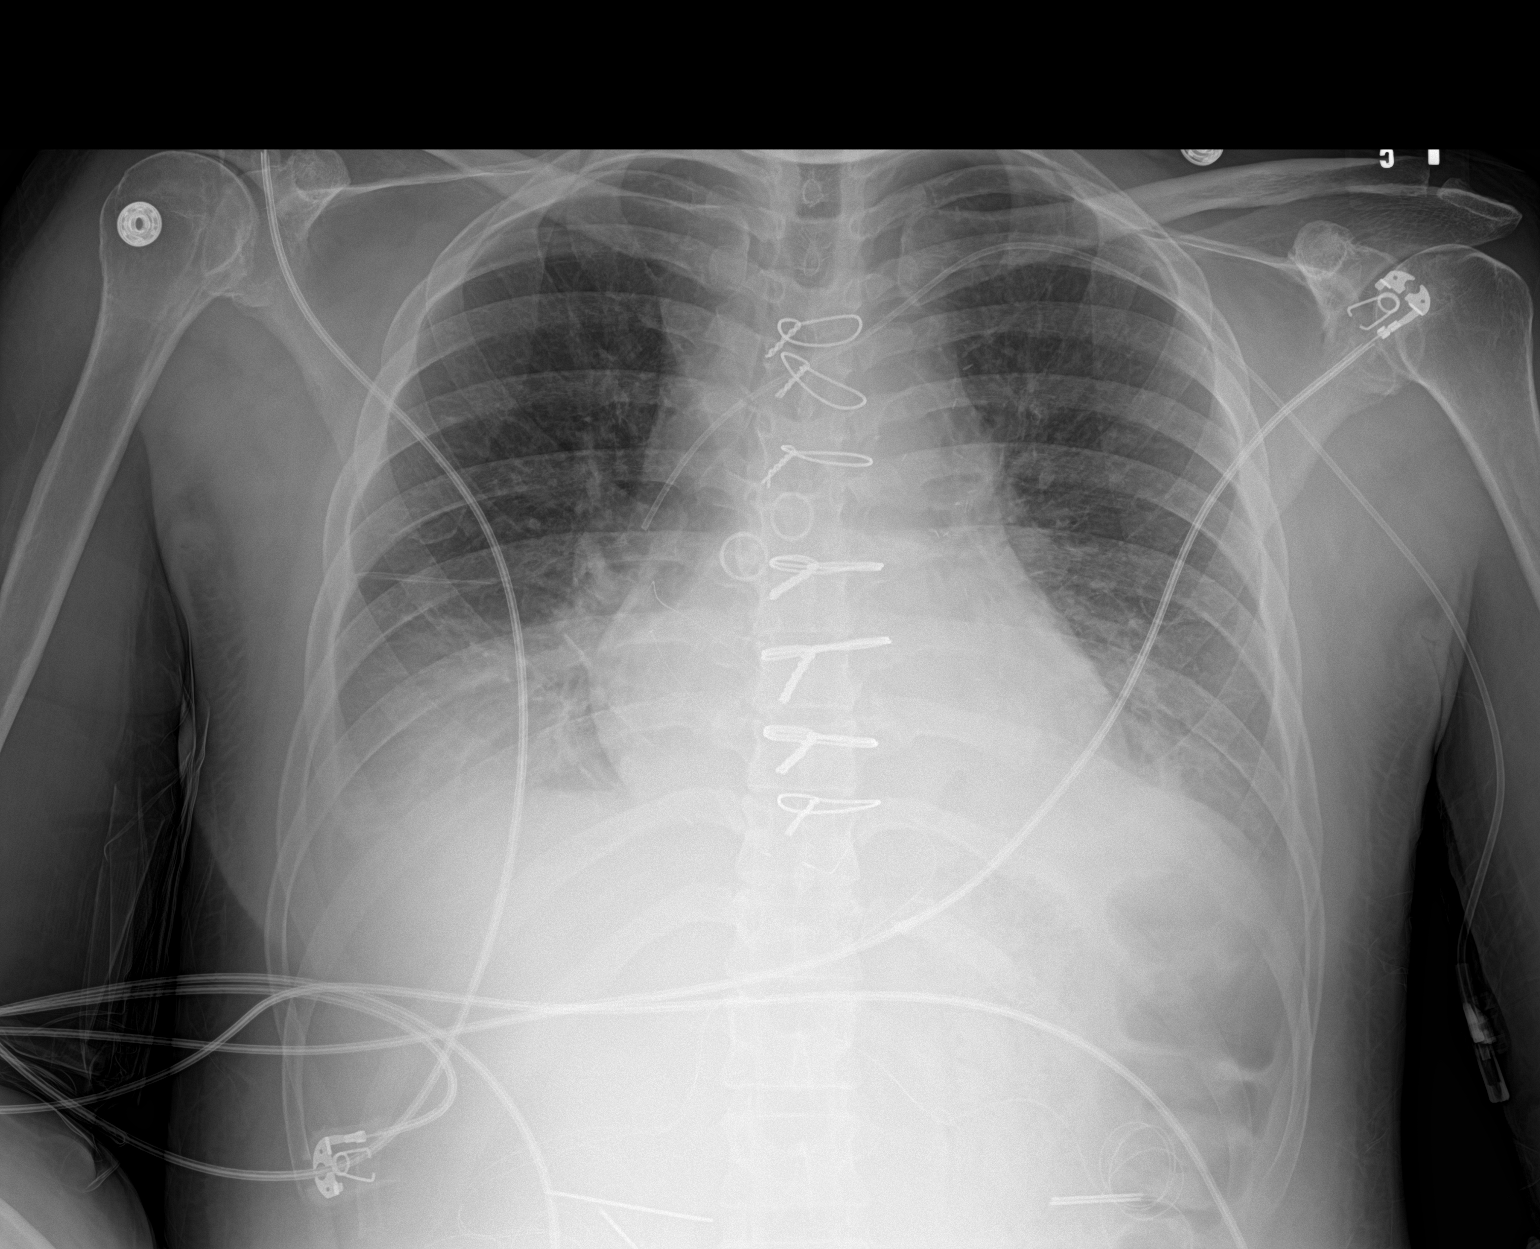

[1 of 1 positions shown; findings below may reference images not displayed]

FINDINGS: Cardiac shadow is stable. Postoperative changes are again seen.
Left-sided PICC line is stable in appearance. Bilateral pleural
effusions right greater than left are noted overall stable from the
prior exam. No pneumothorax is noted.
IMPRESSION: Stable appearance of the chest when compare with the previous day.

## 2018-11-05 ENCOUNTER — Encounter: Payer: Self-pay | Admitting: Obstetrics and Gynecology

## 2018-11-05 ENCOUNTER — Other Ambulatory Visit (HOSPITAL_COMMUNITY)
Admission: RE | Admit: 2018-11-05 | Discharge: 2018-11-05 | Disposition: A | Discharge status: Home / Self Care | Payer: Medicare HMO | Source: Ambulatory Visit | Attending: Obstetrics and Gynecology | Admitting: Obstetrics and Gynecology

## 2018-11-05 ENCOUNTER — Ambulatory Visit (HOSPITAL_COMMUNITY): Payer: Self-pay

## 2018-11-05 ENCOUNTER — Other Ambulatory Visit: Payer: Self-pay

## 2018-11-05 ENCOUNTER — Ambulatory Visit (INDEPENDENT_AMBULATORY_CARE_PROVIDER_SITE_OTHER): Payer: Medicare HMO | Admitting: Obstetrics and Gynecology

## 2018-11-05 VITALS — BP 120/70 | HR 64 | Temp 97.6°F | Resp 16 | Ht 60.75 in | Wt 154.0 lb

## 2018-11-05 DIAGNOSIS — Z862 Personal history of diseases of the blood and blood-forming organs and certain disorders involving the immune mechanism: Secondary | ICD-10-CM

## 2018-11-05 DIAGNOSIS — Z113 Encounter for screening for infections with a predominantly sexual mode of transmission: Secondary | ICD-10-CM | POA: Insufficient documentation

## 2018-11-05 DIAGNOSIS — Z1151 Encounter for screening for human papillomavirus (HPV): Secondary | ICD-10-CM | POA: Diagnosis not present

## 2018-11-05 DIAGNOSIS — Z124 Encounter for screening for malignant neoplasm of cervix: Secondary | ICD-10-CM | POA: Insufficient documentation

## 2018-11-05 DIAGNOSIS — R87810 Cervical high risk human papillomavirus (HPV) DNA test positive: Secondary | ICD-10-CM | POA: Insufficient documentation

## 2018-11-05 DIAGNOSIS — N939 Abnormal uterine and vaginal bleeding, unspecified: Secondary | ICD-10-CM

## 2018-11-05 LAB — POCT URINE PREGNANCY: Preg Test, Ur: NEGATIVE

## 2018-11-05 MED ORDER — MEDROXYPROGESTERONE ACETATE 10 MG PO TABS: ORAL_TABLET | ORAL | 0 refills | Status: DC

## 2018-11-05 NOTE — Patient Instructions (Addendum)
Mammogram Facilities  Yearly screening mammograms are recommended for women beginning at age 51. For a routine screening mammogram, you may schedule the appointment and have it done at the location of your choice.  Please ask the facility to send the results to our office. (fax [phone]) Location options include:  [facility], [place], [place] [address] [phone]  [facility] [address], [place], [place] [address] [phone]  Endometrial Biopsy Post-procedure Instructions . Cramping is common.  You may take Ibuprofen, Aleve, or Tylenol for the cramping.  This should resolve within 24 hours.   . You may have a small amount of spotting.  You should wear a mini pad for the next few days. . You may have intercourse in 24 hours. . You need to call the office if you have any pelvic pain, fever, heavy bleeding, or foul smelling vaginal discharge. . Shower or bathe as normal . You will be notified within one week of your biopsy results or we will discuss your results at your follow-up appointment if needed.

## 2018-11-05 NOTE — Progress Notes (Signed)
53 y.o. No obstetric history on file. Single Black or African American Not Hispanic or Latino female here for consultation from Dr Antony Blackbird for abnormal uterine bleeding. She spoke with Dr Chapman Fitch on 4/3 (phone visit) at at that point reported daily bleeding starting on 3/23 and continuing until the time of the visit. She was started on Megace 20 mg x 5 days. Bleeding stopped with the Megace. Period started again a few weeks later and lasted for ~2 weeks. Started bleeding again last weekend.  Can saturate a pad in up to 3 hours. She is tired, not light headed or dizzy.   The patient has multiple medical problems, including: poorly controlled Type I DM, diabetic neuropathy,diabetic gastroparesis, HTN, H/O CHF, H/O MI, H/O CABG, elevated lipids, anxiety/depression and a known history of anemia. On review of her records her last CBC was in 11/19, Hgb was 9.7. H/O blood transfusion for anemia in the past. Thinks last time was in 2019.    Period Pattern: (!) Irregular Menstrual Flow: Heavy Menstrual Control: Maxi pad Dysmenorrhea: (!) Moderate Dysmenorrhea Symptoms: Cramping  Prior to the last year her cycles were monthly. She thinks she has skipped 2 cycles in the last year. Mostly q month. Typically her cycles last for 4-5 days. Only very long for the last few cycles. Saturates a pad in 3 hours.  [place] she was told she had fibroids.  Occasional hot flashes or night sweats.  Sexually active, new partner. Not using condoms. No contraception. Some pain in her legs with intercourse, can't open her legs well, too stiff.  She does have constipation, has to stay on Colace. She has dry skin with her DM, some hair loss.    Patient's last menstrual period was 11/01/2018 (exact date).          Sexually active: Yes.    The current method of family planning is none.    Exercising: No.  exercise Smoker:  no  Health Maintenance: Pap:  Unsure, long time ago per patient History of abnormal Pap:   no MMG:  never BMD:   none Colonoscopy: none TDaP:  unsure Gardasil: not done [facility]-neg   reports that she has never smoked. She has never used smokeless tobacco. She reports current drug use. Drug: Marijuana. She reports that she does not drink alcohol.  Past Medical History:  Diagnosis Date  . AKI (acute kidney injury) ([place])   . Anemia, iron deficiency   . Anxiety and depression 05/18/2015  . CAD (coronary artery disease), native coronary artery 01/11/2018   Non-STEMI with severe three-vessel disease noted July 2019  . Depression   . Diabetes mellitus without complication ([place])   . Diabetic gastroparesis ([place])   . Diabetic neuropathy, type I diabetes mellitus ([place]) 05/18/2015  . DKA (diabetic ketoacidoses) ([place]) 06/16/2017  . DKA, type 1 ([place]) 03/18/2016  . Essential hypertension   . Gastroparesis   . GERD (gastroesophageal reflux disease)   . HLD (hyperlipidemia)   . MDD (major depressive disorder), single episode, severe , no psychosis ([place])   . Non-ST elevation (NSTEMI) myocardial infarction ([place])   . S/P CABG x 3 01/14/2018  . Tardive dyskinesia   . Vitamin B12 deficiency 08/16/2015    Past Surgical History:  Procedure Laterality Date  . CARDIAC SURGERY    . COLONOSCOPY  09/27/2014   at [facility]  . CORONARY ARTERY BYPASS GRAFT N/A 01/14/2018   Procedure: CORONARY ARTERY BYPASS GRAFTING (CABG)X3, RIGHT AND LEFT SAPHENOUS VEIN HARVEST, MAMMARY TAKE  DOWN. MAMMARY TO LAD, SVG TO PD, SVG TO DISTAL CIRC.;  Surgeon: Grace Isaac, MD;  Location: [facility];  Service: Open Heart Surgery;  Laterality: N/A;  . ESOPHAGOGASTRODUODENOSCOPY  09/27/2014   at [facility], Dr Rolan Lipa. biospy neg for celiac, neg for H pylori.   . EYE SURGERY    . gailstones    . IR FLUORO GUIDE CV LINE RIGHT  02/01/2017  . IR FLUORO GUIDE CV LINE RIGHT  03/06/2017  . IR FLUORO GUIDE CV LINE RIGHT  03/25/2017  . IR GENERIC HISTORICAL  01/24/2016   IR FLUORO GUIDE CV LINE RIGHT 01/24/2016 Darrell K Allred, PA-C  [facility]INTER[facility]  . IR GENERIC HISTORICAL  01/24/2016   IR US GUIDE VASC ACCESS RIGHT 01/24/2016 Darrell K Allred, PA-C [facility]  . IR US GUIDE VASC ACCESS RIGHT  02/01/2017  . IR US GUIDE VASC ACCESS RIGHT  03/06/2017  . IR US GUIDE VASC ACCESS RIGHT  03/25/2017  . LEFT HEART CATH AND CORONARY ANGIOGRAPHY N/A 01/07/2018   Procedure: LEFT HEART CATH AND CORONARY ANGIOGRAPHY;  Surgeon: Troy Sine, MD;  Location: [place] [facility];  Service: Cardiovascular;  Laterality: N/A;  . POSTERIOR VITRECTOMY AND MEMBRANE PEEL-LEFT EYE  09/28/2002  . POSTERIOR VITRECTOMY AND MEMBRANE PEEL-RIGHT EYE  03/16/2002  . RETINAL DETACHMENT SURGERY    . TEE WITHOUT CARDIOVERSION N/A 01/14/2018   Procedure: TRANSESOPHAGEAL ECHOCARDIOGRAM (TEE);  Surgeon: Grace Isaac, MD;  Location: [facility];  Service: Open Heart Surgery;  Laterality: N/A;    Current Outpatient Medications  Medication Sig Dispense Refill  . ammonium lactate (AMLACTIN) 12 % cream Apply topically as needed for dry skin. 385 g 0  . aspirin EC 81 MG tablet Take 1 tablet (81 mg total) by mouth daily.    . Blood Glucose Monitoring Suppl (ONETOUCH VERIO) w/Device KIT Use as directed 1 kit 0  . busPIRone (BUSPAR) 10 MG tablet Take 1 tablet (10 mg total) by mouth 3 (three) times daily. 90 tablet 3  . diphenhydrAMINE (BENADRYL) 25 mg capsule Take 25 mg by mouth every 6 (six) hours as needed for allergies.    Marland Kitchen docusate sodium (COLACE) 100 MG capsule Take 1 capsule (100 mg total) by mouth daily as needed for mild constipation. 10 capsule 0  . furosemide (LASIX) 40 MG tablet Take 1 tablet (40 mg total) by mouth daily as needed for fluid or edema. 30 tablet 3  . glucose blood (ACCU-CHEK AVIVA PLUS) test strip Use as instructed to test blood sugar 5 times daily Dx E10.65 150 each 0  . insulin aspart (NOVOLOG) 100 UNIT/ML injection Inject 0-12 Units into the skin 3 (three) times daily before meals. As per sliding scale 30 mL 0  . Insulin Glargine (LANTUS  SOLOSTAR) 100 UNIT/ML Solostar Pen Inject 15 units under the skin once daily in the morning. 5 pen 3  . Insulin Syringe-Needle U-100 (BD INSULIN SYRINGE ULTRAFINE) 31G X 5/16" 0.5 ML MISC 1 each by Does not apply route 4 (four) times daily. 120 each 5  . Lancets (ONETOUCH DELICA PLUS HYWVPX10G) MISC 1 Units by Does not apply route as directed. 100 each 11  . loperamide (IMODIUM) 2 MG capsule Take 1 capsule (2 mg total) by mouth as needed for diarrhea or loose stools. 30 capsule 0  . losartan (COZAAR) 25 MG tablet Take 1 tablet (25 mg total) by mouth daily. 90 tablet 3  . metoprolol tartrate (LOPRESSOR) 25 MG tablet Take 0.5 tablets (12.5 mg  total) by mouth 2 (two) times daily. 90 tablet 3  . pantoprazole (PROTONIX) 40 MG tablet Take 1 tablet (40 mg total) by mouth daily. 30 tablet 3  . potassium chloride (K-DUR) 10 MEQ tablet Take 1 tablet (10 mEq total) by mouth daily. (Patient taking differently: Take 10 mEq by mouth as needed. ) 30 tablet 3  . pregabalin (LYRICA) 150 MG capsule Take 1 capsule (150 mg total) by mouth 2 (two) times daily. 180 capsule 1  . promethazine (PHENERGAN) 12.5 MG tablet Take 1 tablet (12.5 mg total) by mouth every 6 (six) hours as needed for nausea or vomiting. 30 tablet 0  . pyridostigmine (MESTINON) 60 MG tablet Take 60 mg by mouth 3 (three) times daily.     No current facility-administered medications for this visit.     Family History  Problem Relation Age of Onset  . Cystic fibrosis Mother   . Hypertension Father   . Diabetes Brother   . Hypertension Maternal Grandmother   Niece with breast cancer age 51 Cousin and 33 with breast cancer, Dad's side of the family.   Review of Systems  Constitutional: Positive for fatigue.  HENT: Negative.   Eyes: Negative.   Respiratory: Negative.   Cardiovascular: Negative.   Gastrointestinal: Negative.   Endocrine: Negative.   Genitourinary:       Heavy irregular cycle  Musculoskeletal: Negative.   Skin: Negative.    Allergic/Immunologic: Negative.   Neurological: Negative.   Psychiatric/Behavioral: Negative.     Exam:   BP 120/70   Pulse 64   Temp 97.6 F (36.4 C) (Oral)   Resp 16   Ht 5' 0.75" (1.543 m)   Wt 154 lb (69.9 kg)   LMP 11/01/2018 (Exact Date) Comment: april 26th cycle before may bleeding  BMI 29.34 kg/m   Weight change: @WEIGHTCHANGE @ Height:   Height: 5' 0.75" (154.3 cm)  Ht Readings from Last 3 Encounters:  11/05/18 5' 0.75" (1.543 m)  10/08/18 5' 2"  (1.575 m)  09/22/18 5' 1"  (1.549 m)    General appearance: alert, cooperative and appears stated age Head: Normocephalic, without obvious abnormality, atraumatic Neck: no adenopathy, supple, symmetrical, trachea midline and thyroid normal to inspection and palpation Lungs: clear to auscultation bilaterally Cardiovascular: regular rate and rhythm Breasts: normal appearance, no masses or tenderness Abdomen: soft, non-tender; non distended,  no masses,  no organomegaly Extremities: extremities normal, atraumatic, no cyanosis or edema Skin: Skin color, texture, turgor normal. No rashes or lesions Lymph nodes: Cervical, supraclavicular, and axillary nodes normal. No abnormal inguinal nodes palpated Neurologic: Grossly normal   Pelvic: External genitalia:  no lesions              Urethra:  normal appearing urethra with no masses, tenderness or lesions              Bartholins and Skenes: normal                 Vagina: normal appearing vagina with normal color and discharge, no lesions              Cervix: no lesions and actively bleeding, moderate flow               Bimanual Exam:  Uterus:  normal size, contour, position, consistency, mobility, non-tender              Adnexa: no mass, fullness, tenderness               Rectovaginal: Confirms  Anus:  normal sphincter tone, no lesions  The risks of endometrial biopsy were reviewed and a consent was obtained.  A speculum was placed in the vagina and the cervix was  cleansed with betadine. A tenaculum was placed on the cervix and the uterine evacuator was placed into the endometrial cavity. The uterus sounded to 7 cm. The endometrial biopsy was performed, minimal tissue was obtained, the cavity had the characteristically gritty texture. The tenaculum and speculum were removed. There were no complications.    Chaperone was present for exam.  A:  Abnormal uterine bleeding, suspect perimenopausal bleeding from hormonal changes  H/O fibroids, not obvious on exam  H/O anemia, her baseline cycles don't sound heavy enough to cause anemia. Her bleeding for the last 6 weeks has been excessive.   Multiple medical issues  H/O new sexual partner, not using contraception  P:   UPT negative  Pap with hpv  Endometrial biopsy  CBC (and office hgb), Ferritin, TSH   STD testing  Return for GYN ultrasound  Provera 10 mg BID until bleeding stops then 1 tablet a day (#30, no refills)  Encouraged patient to have a mammogram, #'s given  Overdue for Colonoscopy (will defer to primary)  Further plans after results return   CC: Dr Chapman Fitch, Dr Margarita Rana Note sent   Addendum: hgb was 11.3

## 2018-11-07 ENCOUNTER — Ambulatory Visit (HOSPITAL_COMMUNITY): Payer: Self-pay

## 2018-11-07 LAB — RPR: RPR Ser Ql: NONREACTIVE

## 2018-11-07 LAB — TSH: TSH: 1.11 u[IU]/mL (ref 0.450–4.500)

## 2018-11-07 LAB — CBC
Hematocrit: 33.5 % — ABNORMAL LOW (ref 34.0–46.6)
Hemoglobin: 11.4 g/dL (ref 11.1–15.9)
MCH: 30.3 pg (ref 26.6–33.0)
MCHC: 34 g/dL (ref 31.5–35.7)
MCV: 89 fL (ref 79–97)
Platelets: 292 10*3/uL (ref 150–450)
RBC: 3.76 x10E6/uL — ABNORMAL LOW (ref 3.77–5.28)
RDW: 12.7 % (ref 11.7–15.4)
WBC: 5.4 10*3/uL (ref 3.4–10.8)

## 2018-11-07 LAB — HIV ANTIBODY (ROUTINE TESTING W REFLEX): HIV Screen 4th Generation wRfx: NONREACTIVE

## 2018-11-07 LAB — FERRITIN: Ferritin: 15 ng/mL (ref 15–150)

## 2018-11-07 IMAGING — DX DG CHEST 2V
2 series · 2 of 2 positions shown · non-contrast
Comparison: 01/18/2018

CLINICAL DATA: Follow-up pleural effusion

EXAM:
CHEST - 2 VIEW

[chest pa]
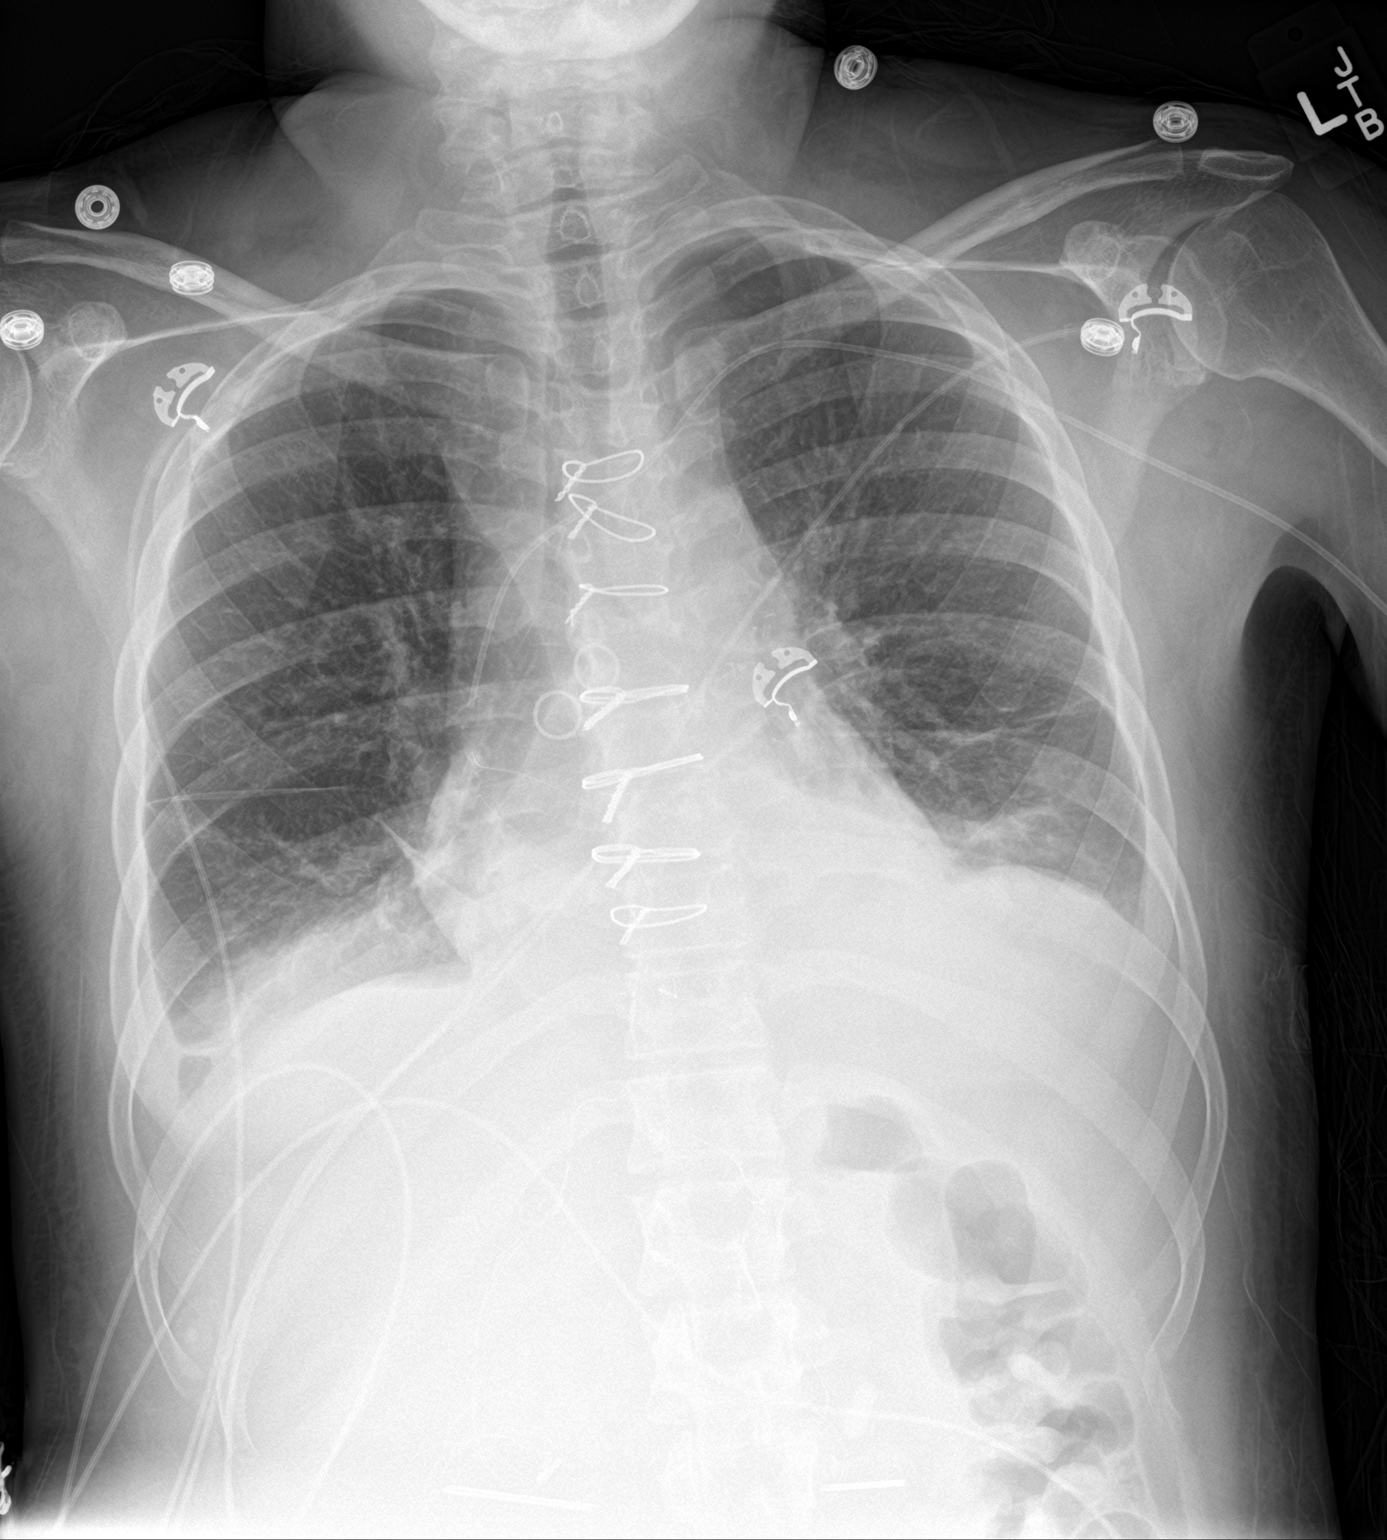

[chest lat]
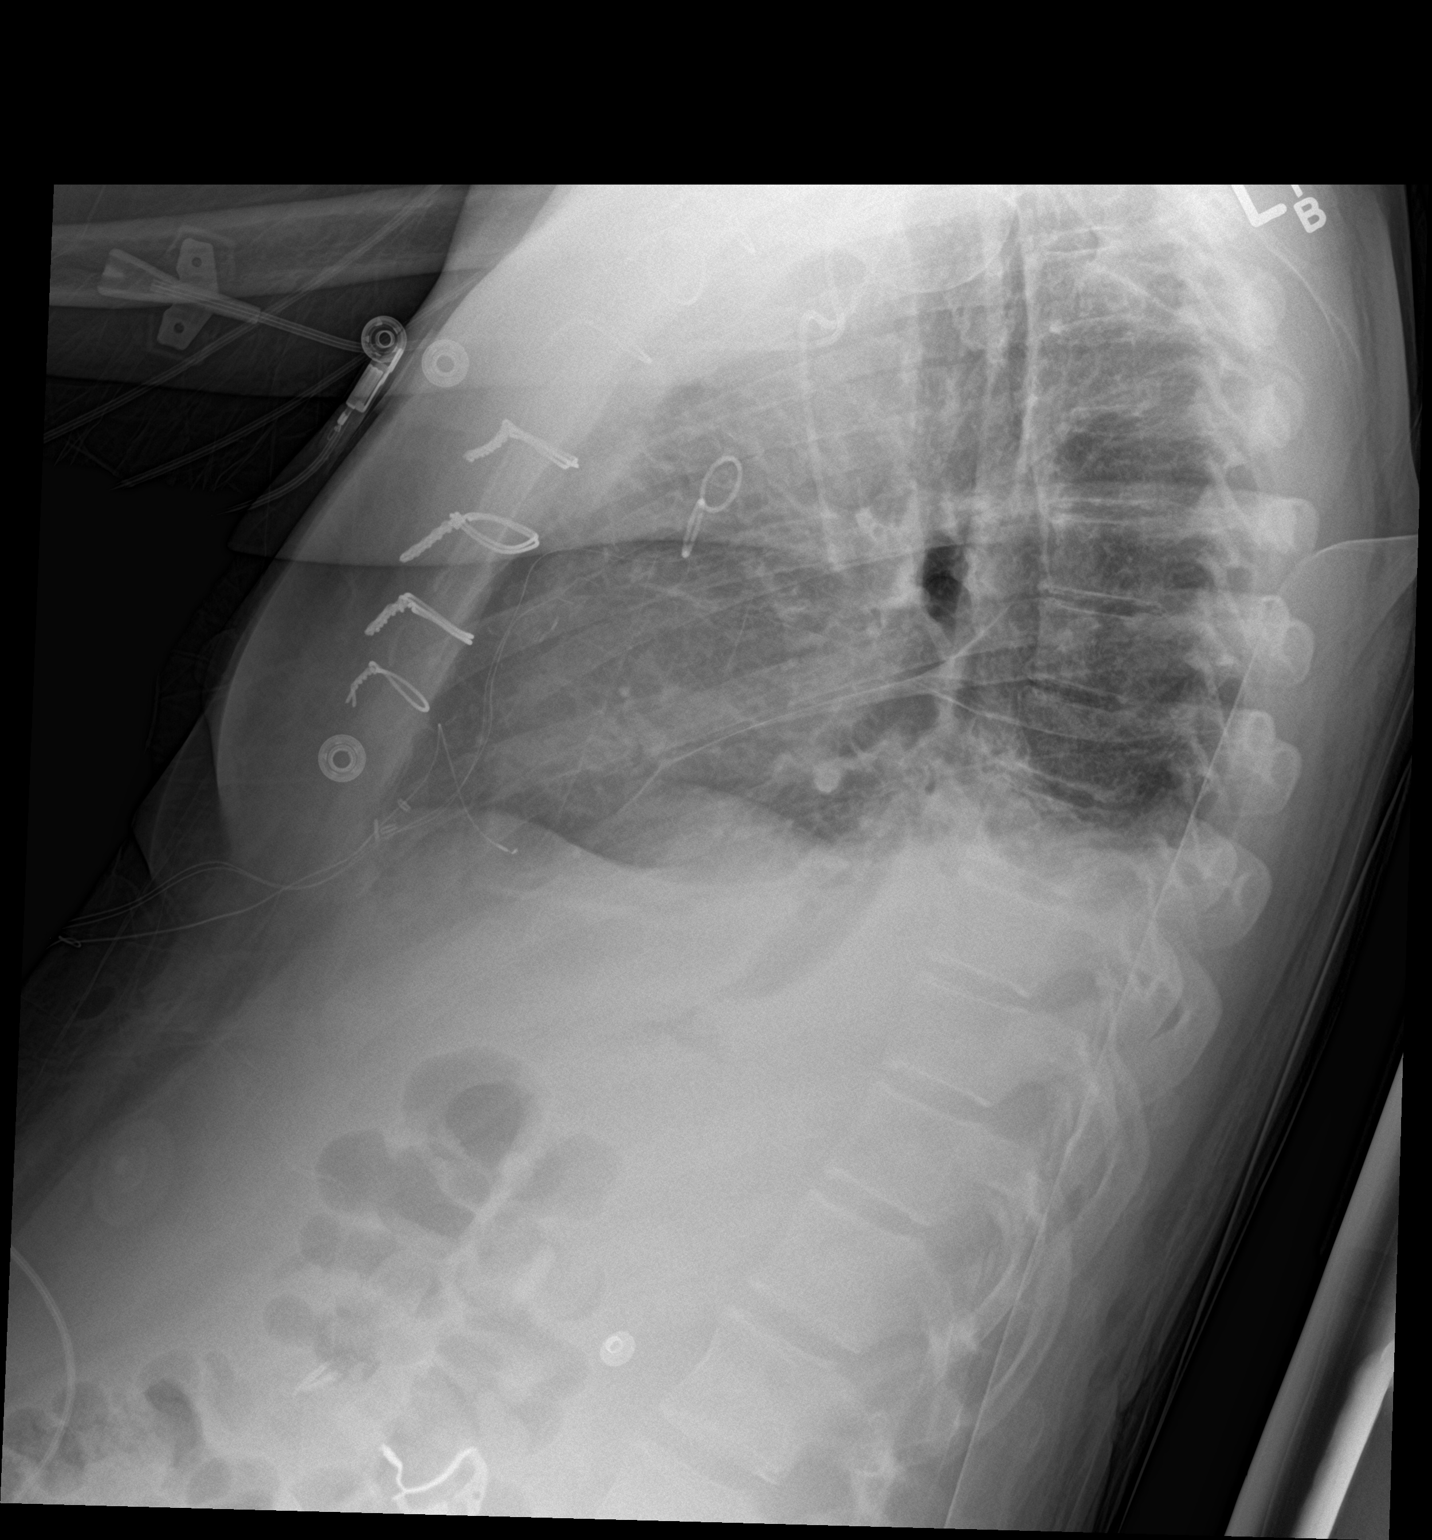

[2 of 2 positions shown; findings below may reference images not displayed]

FINDINGS: Cardiac shadow is stable. Postsurgical changes are again seen.
Left-sided PICC line is noted and stable. Bibasilar atelectatic
changes are noted right greater than left with associated small
effusions. No new focal infiltrate is seen. No bony abnormality is
noted.
IMPRESSION: Bibasilar changes as described.

## 2018-11-10 ENCOUNTER — Ambulatory Visit (HOSPITAL_COMMUNITY): Payer: Self-pay

## 2018-11-10 IMAGING — DX DG CHEST 1V PORT
1 series · 1 of 1 positions shown · non-contrast
Comparison: 01/21/2018.  01/18/2018.

CLINICAL DATA: Shortness of breath.

EXAM:
PORTABLE CHEST 1 VIEW

[chest]
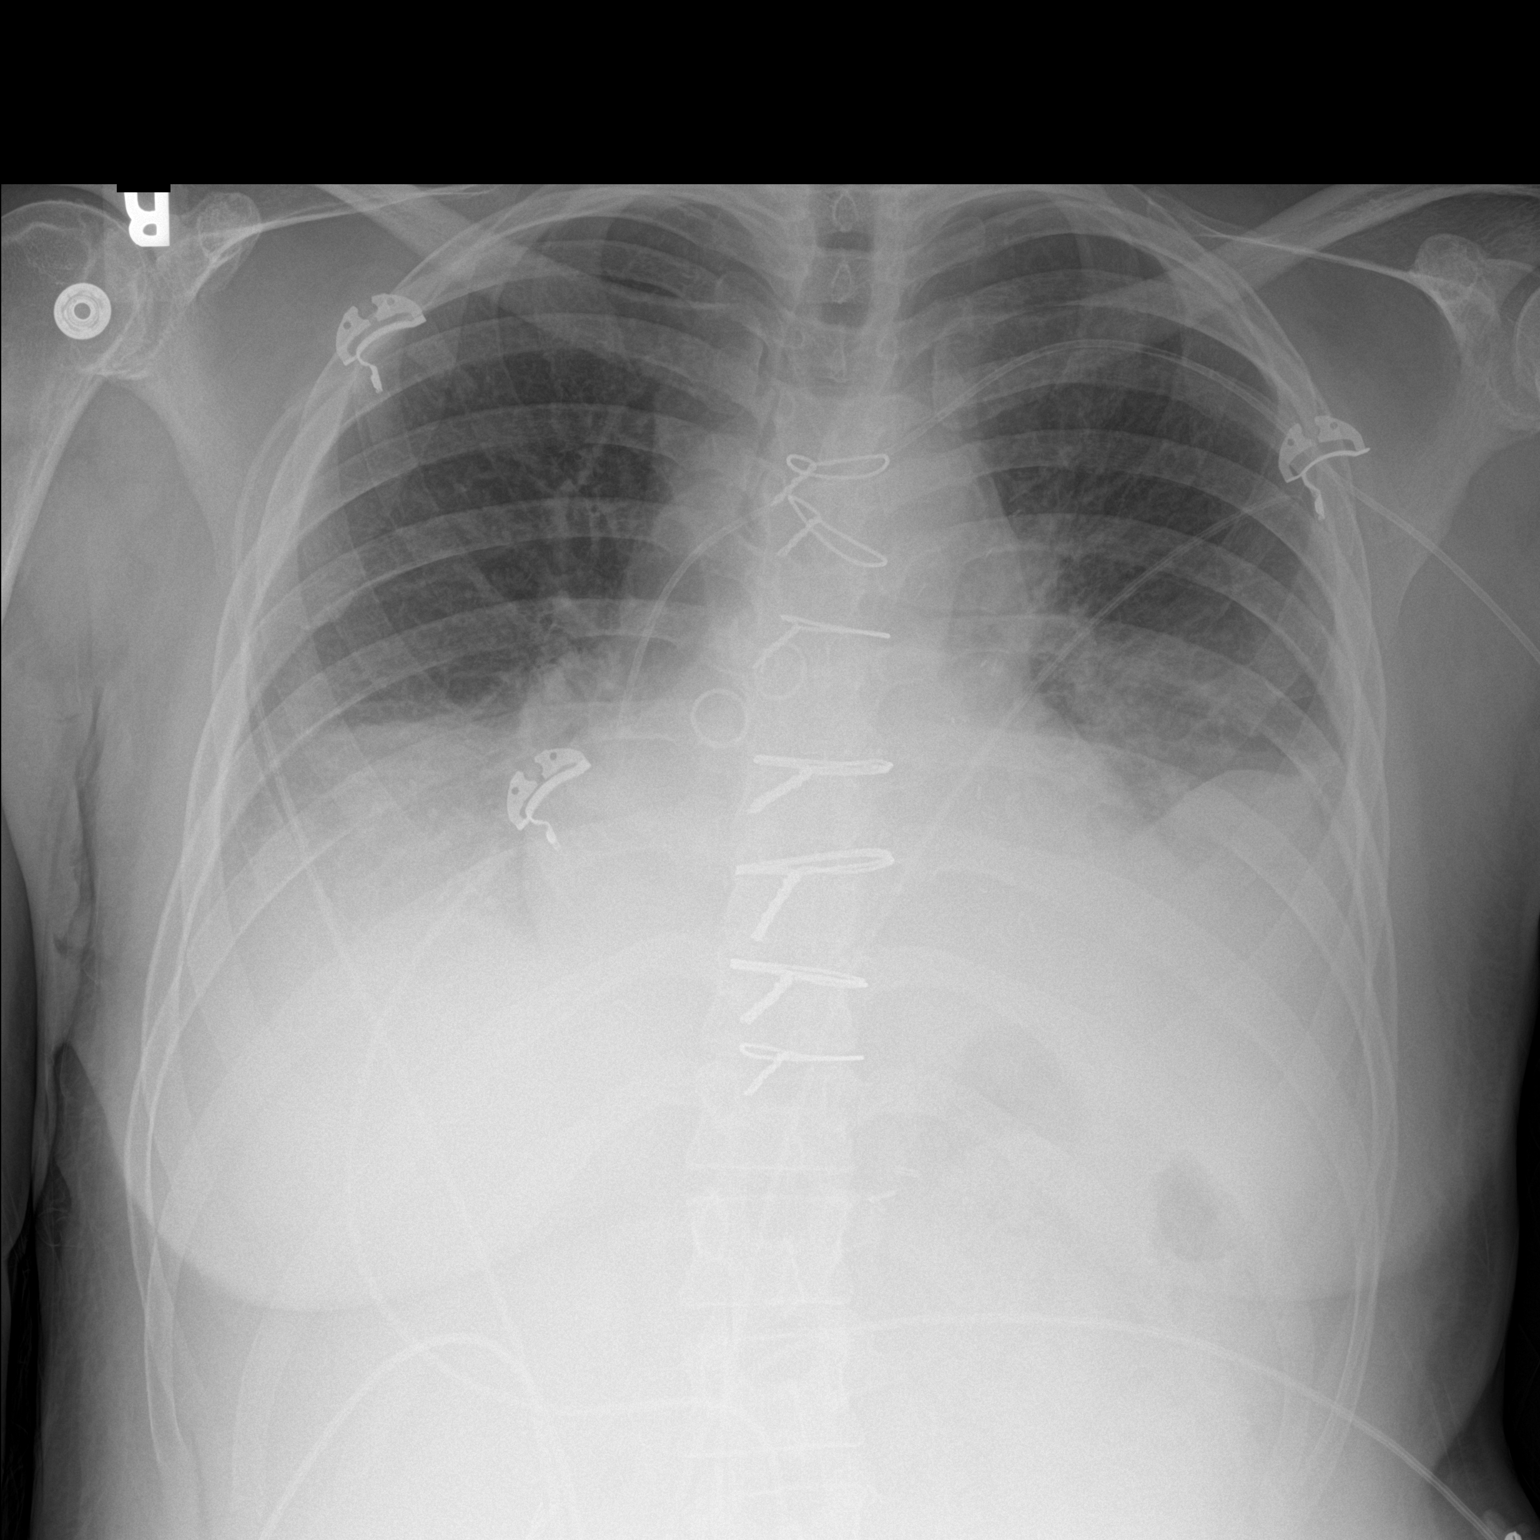

[1 of 1 positions shown; findings below may reference images not displayed]

FINDINGS: Left PICC line stable position. Prior CABG. Stable cardiomegaly.
Bilateral basilar pulmonary infiltrates/edema and bilateral pleural
effusions. No pneumothorax.
IMPRESSION: 1.  Left PICC line stable position.

2. Prior CABG. Cardiomegaly with bibasilar infiltrates/edema
bilateral pleural effusions. Findings suggest CHF. These findings
have progressed from prior exam.

## 2018-11-11 ENCOUNTER — Telehealth: Payer: Self-pay | Admitting: Family Medicine

## 2018-11-11 ENCOUNTER — Telehealth: Payer: Self-pay | Admitting: Obstetrics and Gynecology

## 2018-11-11 LAB — CYTOLOGY - PAP
Chlamydia: NEGATIVE
Diagnosis: NEGATIVE
HPV 16/18/45 genotyping: POSITIVE — AB
HPV: DETECTED — AB
Neisseria Gonorrhea: NEGATIVE

## 2018-11-11 NOTE — Telephone Encounter (Signed)
1) Medication(s) Requested (by name): promethazine (PHENERGAN) 12.5 MG   2) Pharmacy of Choice: [id number] - [place], [place] - [place]

## 2018-11-11 NOTE — Telephone Encounter (Signed)
-----   Message from Salvadore Dom, MD sent at 11/11/2018 11:20 AM EDT ----- Please add hpv 16/18/45 to the patients pap. Pap was otherwise normal except for endometrial cells. Further plans depending on HPV suptyping. Endometrial biopsy negative The patient has mild iron def anemia. Please have her start on one iron tablet a day, ie ferrex 150 mg.  Her STD testing was negative and TSH was normal. Please advise of above results. Ultrasound is being scheduled.

## 2018-11-11 NOTE — Telephone Encounter (Signed)
Spoke with patient.   1. Advised of results as seen below per Dr. Talbert Nan.   2. Patient reports urgency, voiding regular amounts, cloudy urine with odor and nausea that started on 5/17. Denies vomiting, fever/chills, dysuria. Reports lower back pain, this is not new for her, no change.  Started provera 10mg  daily on 5/13, reports spotting only when wiping. Advised OV needed for further evaluation of urinary symptoms. Patient agreeable to OV, requires [facility] for transportation, this requires 24 hrs notice for scheduling. Advised I will review schedule with provider and return call, patient agreeable.   HPV 16/18/45 Add on request faxed and received by [facility], confirmed with [facility].

## 2018-11-11 NOTE — Telephone Encounter (Signed)
Patient is calling for test results and she is having uti symptoms.

## 2018-11-11 NOTE — Telephone Encounter (Signed)
Schedule reviewed with nursing supervisor. Call returned to patient. PUS scheduled for 5/21 at 10am, consult and evaluation to follow at 11am with Dr. Talbert Nan. Advised if symptoms worsen or new symptoms develop, return call to office for earlier visit or urgent care for further evaluation after hours. Patient verbalizes understanding and is agreeable  Routing to Dr. Talbert Nan. Encounter closed.  Cc: Lerry Liner

## 2018-11-12 ENCOUNTER — Ambulatory Visit (HOSPITAL_COMMUNITY): Payer: Self-pay

## 2018-11-13 ENCOUNTER — Encounter: Payer: Self-pay | Admitting: Obstetrics and Gynecology

## 2018-11-13 ENCOUNTER — Other Ambulatory Visit: Payer: Self-pay

## 2018-11-13 ENCOUNTER — Ambulatory Visit (INDEPENDENT_AMBULATORY_CARE_PROVIDER_SITE_OTHER): Payer: Medicare HMO | Admitting: Obstetrics and Gynecology

## 2018-11-13 ENCOUNTER — Ambulatory Visit (INDEPENDENT_AMBULATORY_CARE_PROVIDER_SITE_OTHER): Payer: Medicare HMO

## 2018-11-13 VITALS — BP 112/70 | HR 66 | Temp 97.9°F | Wt 154.2 lb

## 2018-11-13 DIAGNOSIS — N3090 Cystitis, unspecified without hematuria: Secondary | ICD-10-CM

## 2018-11-13 DIAGNOSIS — R3915 Urgency of urination: Secondary | ICD-10-CM

## 2018-11-13 DIAGNOSIS — N939 Abnormal uterine and vaginal bleeding, unspecified: Secondary | ICD-10-CM

## 2018-11-13 DIAGNOSIS — R8290 Unspecified abnormal findings in urine: Secondary | ICD-10-CM

## 2018-11-13 DIAGNOSIS — Z3009 Encounter for other general counseling and advice on contraception: Secondary | ICD-10-CM

## 2018-11-13 LAB — POCT URINALYSIS DIPSTICK
Bilirubin, UA: NEGATIVE
Blood, UA: NEGATIVE
Glucose, UA: NEGATIVE
Ketones, UA: NEGATIVE
Nitrite, UA: POSITIVE
Odor: POSITIVE
Protein, UA: NEGATIVE
Spec Grav, UA: 1.01 (ref 1.010–1.025)
Urobilinogen, UA: 0.2 E.U./dL
pH, UA: 5 (ref 5.0–8.0)

## 2018-11-13 MED ORDER — SULFAMETHOXAZOLE-TRIMETHOPRIM 800-160 MG PO TABS: 1.0000 | ORAL_TABLET | Freq: Two times a day (BID) | ORAL | 0 refills | Status: DC

## 2018-11-13 NOTE — Progress Notes (Signed)
GYNECOLOGY  VISIT   HPI: 53 y.o.   Single Black or African American Not Hispanic or Latino  female   [phone] with Patient's last menstrual period was 11/01/2018 (exact date).   here for consult following PUS for abnormal uterine bleeding.  She was having normal cycles until the last year. Skipped a few cycles last year, then likely anovulatory bleeding this spring.  UPT negative, CBC with mild anemia (11.4/33.5), ferritin 15, normal TSH, negative pap, +HPV (subtyping pending), endometrial biopsy with inactive endometrium with breakdown. She was started on Provera on 5/13, no longer bleeding.  She c/o a 5 day h/o urinary odor, cloudy urine, decreased appetite, slight urgency. No frequency or dysuria. No fever or flank pain.     The patient has multiple medical problems, including: poorly controlled Type I DM, diabetic neuropathy,diabetic gastroparesis, HTN, H/O CHF, H/O MI, H/O CABG, elevated lipids, anxiety/depression  GYNECOLOGIC HISTORY: Patient's last menstrual period was 11/01/2018 (exact date). Contraception:None Menopausal hormone therapy: None        OB History    Gravida  2   Para      Term      Preterm      AB  1   Living  1     SAB      TAB  1   Ectopic      Multiple      Live Births                 Patient Active Problem List   Diagnosis Date Noted  . Diabetic ketoacidosis ([place]) 04/30/2018  . Gastroparesis 04/13/2018  . DKA (diabetic ketoacidosis) ([place]) 03/31/2018  . S/P CABG x 3 01/14/2018  . CAD (coronary artery disease), native coronary artery 01/11/2018  . Dehydration   . MDD (major depressive disorder), single episode, severe , no psychosis ([place])   . DKA (diabetic ketoacidoses) ([place]) 06/16/2017  . Tardive dyskinesia   . DKA, type 1 ([place]) 03/18/2016  . Noncompliance with treatment plan 03/13/2016  . Essential hypertension 01/24/2016  . HLD (hyperlipidemia)   . GERD (gastroesophageal reflux disease)   . AKI (acute kidney injury) ([place])    . Anemia, iron deficiency   . Vitamin B12 deficiency 08/16/2015  . Diabetic gastroparesis ([place])   . Diabetic neuropathy, type I diabetes mellitus ([place]) 05/18/2015  . Anxiety and depression 05/18/2015    Past Medical History:  Diagnosis Date  . AKI (acute kidney injury) ([place])   . Anemia, iron deficiency   . Anxiety and depression 05/18/2015  . CAD (coronary artery disease), native coronary artery 01/11/2018   Non-STEMI with severe three-vessel disease noted July 2019  . Depression   . Diabetes mellitus without complication ([place])   . Diabetic gastroparesis ([place])   . Diabetic neuropathy, type I diabetes mellitus ([place]) 05/18/2015  . DKA (diabetic ketoacidoses) ([place]) 06/16/2017  . DKA, type 1 ([place]) 03/18/2016  . Essential hypertension   . Gastroparesis   . GERD (gastroesophageal reflux disease)   . HLD (hyperlipidemia)   . MDD (major depressive disorder), single episode, severe , no psychosis ([place])   . Non-ST elevation (NSTEMI) myocardial infarction ([place])   . S/P CABG x 3 01/14/2018  . Tardive dyskinesia   . Vitamin B12 deficiency 08/16/2015    Past Surgical History:  Procedure Laterality Date  . CARDIAC SURGERY    . COLONOSCOPY  09/27/2014   at [facility]  . CORONARY ARTERY BYPASS GRAFT N/A 01/14/2018   Procedure: CORONARY ARTERY BYPASS GRAFTING (CABG)X3, RIGHT AND  LEFT SAPHENOUS VEIN HARVEST, MAMMARY TAKE DOWN. MAMMARY TO LAD, SVG TO PD, SVG TO DISTAL CIRC.;  Surgeon: Grace Isaac, MD;  Location: [facility];  Service: Open Heart Surgery;  Laterality: N/A;  . ESOPHAGOGASTRODUODENOSCOPY  09/27/2014   at [facility], Dr Rolan Lipa. biospy neg for celiac, neg for H pylori.   . EYE SURGERY    . gailstones    . IR FLUORO GUIDE CV LINE RIGHT  02/01/2017  . IR FLUORO GUIDE CV LINE RIGHT  03/06/2017  . IR FLUORO GUIDE CV LINE RIGHT  03/25/2017  . IR GENERIC HISTORICAL  01/24/2016   IR FLUORO GUIDE CV LINE RIGHT 01/24/2016 Darrell K Allred, PA-C [facility]  . IR GENERIC HISTORICAL  01/24/2016   IR  US GUIDE VASC ACCESS RIGHT 01/24/2016 Darrell K Allred, PA-C [facility]  . IR US GUIDE VASC ACCESS RIGHT  02/01/2017  . IR US GUIDE VASC ACCESS RIGHT  03/06/2017  . IR US GUIDE VASC ACCESS RIGHT  03/25/2017  . LEFT HEART CATH AND CORONARY ANGIOGRAPHY N/A 01/07/2018   Procedure: LEFT HEART CATH AND CORONARY ANGIOGRAPHY;  Surgeon: Troy Sine, MD;  Location: [facility];  Service: Cardiovascular;  Laterality: N/A;  . POSTERIOR VITRECTOMY AND MEMBRANE PEEL-LEFT EYE  09/28/2002  . POSTERIOR VITRECTOMY AND MEMBRANE PEEL-RIGHT EYE  03/16/2002  . RETINAL DETACHMENT SURGERY    . TEE WITHOUT CARDIOVERSION N/A 01/14/2018   Procedure: TRANSESOPHAGEAL ECHOCARDIOGRAM (TEE);  Surgeon: Grace Isaac, MD;  Location: [facility];  Service: Open Heart Surgery;  Laterality: N/A;    Current Outpatient Medications  Medication Sig Dispense Refill  . ammonium lactate (AMLACTIN) 12 % cream Apply topically as needed for dry skin. 385 g 0  . aspirin EC 81 MG tablet Take 1 tablet (81 mg total) by mouth daily.    . Blood Glucose Monitoring Suppl (ONETOUCH VERIO) w/Device KIT Use as directed 1 kit 0  . busPIRone (BUSPAR) 10 MG tablet Take 1 tablet (10 mg total) by mouth 3 (three) times daily. 90 tablet 3  . diphenhydrAMINE (BENADRYL) 25 mg capsule Take 25 mg by mouth every 6 (six) hours as needed for allergies.    Marland Kitchen docusate sodium (COLACE) 100 MG capsule Take 1 capsule (100 mg total) by mouth daily as needed for mild constipation. 10 capsule 0  . furosemide (LASIX) 40 MG tablet Take 1 tablet (40 mg total) by mouth daily as needed for fluid or edema. 30 tablet 3  . glucose blood (ACCU-CHEK AVIVA PLUS) test strip Use as instructed to test blood sugar 5 times daily Dx E10.65 150 each 0  . insulin aspart (NOVOLOG) 100 UNIT/ML injection Inject 0-12 Units into the skin 3 (three) times daily before meals. As per sliding scale 30 mL 0  . Insulin Glargine (LANTUS SOLOSTAR) 100 UNIT/ML Solostar Pen Inject 15 units under the  skin once daily in the morning. 5 pen 3  . Insulin Syringe-Needle U-100 (BD INSULIN SYRINGE ULTRAFINE) 31G X 5/16" 0.5 ML MISC 1 each by Does not apply route 4 (four) times daily. 120 each 5  . Lancets (ONETOUCH DELICA PLUS [id number]) MISC 1 Units by Does not apply route as directed. 100 each 11  . loperamide (IMODIUM) 2 MG capsule Take 1 capsule (2 mg total) by mouth as needed for diarrhea or loose stools. 30 capsule 0  . losartan (COZAAR) 25 MG tablet Take 1 tablet (25 mg total) by mouth daily. 90 tablet 3  . medroxyPROGESTERone (PROVERA) 10 MG tablet  1 tablet po BID until bleeding stops, then go down to one tablet a day 30 tablet 0  . metoprolol tartrate (LOPRESSOR) 25 MG tablet Take 0.5 tablets (12.5 mg total) by mouth 2 (two) times daily. 90 tablet 3  . pantoprazole (PROTONIX) 40 MG tablet Take 1 tablet (40 mg total) by mouth daily. 30 tablet 3  . potassium chloride (K-DUR) 10 MEQ tablet Take 1 tablet (10 mEq total) by mouth daily. (Patient taking differently: Take 10 mEq by mouth as needed. ) 30 tablet 3  . pregabalin (LYRICA) 150 MG capsule Take 1 capsule (150 mg total) by mouth 2 (two) times daily. 180 capsule 1  . promethazine (PHENERGAN) 12.5 MG tablet Take 1 tablet (12.5 mg total) by mouth every 6 (six) hours as needed for nausea or vomiting. 30 tablet 0  . pyridostigmine (MESTINON) 60 MG tablet Take 60 mg by mouth 3 (three) times daily.     No current facility-administered medications for this visit.      ALLERGIES: Anesthetics, amide; Penicillins; Buprenorphine hcl; Encainide; and Metoclopramide  Family History  Problem Relation Age of Onset  . Cystic fibrosis Mother   . Hypertension Father   . Diabetes Brother   . Hypertension Maternal Grandmother     Social History   Socioeconomic History  . Marital status: Single    Spouse name: Not on file  . Number of children: 1  . Years of education: Not on file  . Highest education level: Not on file  Occupational History  .  Occupation: Unemployed  Social Needs  . Financial resource strain: Not on file  . Food insecurity:    Worry: Not on file    Inability: Not on file  . Transportation needs:    Medical: Not on file    Non-medical: Not on file  Tobacco Use  . Smoking status: Never Smoker  . Smokeless tobacco: Never Used  Substance and Sexual Activity  . Alcohol use: No  . Drug use: Yes    Types: Marijuana    Comment: occ  . Sexual activity: Yes    Partners: Male    Birth control/protection: None  Lifestyle  . Physical activity:    Days per week: Not on file    Minutes per session: Not on file  . Stress: Not on file  Relationships  . Social connections:    Talks on phone: Not on file    Gets together: Not on file    Attends religious service: Not on file    Active member of club or organization: Not on file    Attends meetings of clubs or organizations: Not on file    Relationship status: Not on file  . Intimate partner violence:    Fear of current or ex partner: Not on file    Emotionally abused: Not on file    Physically abused: Not on file    Forced sexual activity: Not on file  Other Topics Concern  . Not on file  Social History Narrative  . Not on file    Review of Systems  Constitutional: Negative.   HENT: Negative.   Eyes: Negative.   Respiratory: Negative.   Cardiovascular: Negative.   Gastrointestinal: Negative.   Musculoskeletal: Negative.   Skin: Negative.   Neurological: Negative.   Endo/Heme/Allergies: Negative.     PHYSICAL EXAMINATION:    BP 112/70 (BP Location: Right Arm, Patient Position: Sitting, Cuff Size: Normal)   Pulse 66   Temp 97.9 F (36.6 C) (Skin)  Wt 154 lb 3.2 oz (69.9 kg)   LMP 11/01/2018 (Exact Date) Comment: april 26th cycle before may bleeding  BMI 29.38 kg/m     General appearance: alert, cooperative and appears stated age Abdomen: soft, non-tender; non distended, no masses,  no organomegaly CVA: not tender    Urine dip: +nitrate,  2+leuk, +odor  Ultrasound images reviewed with the patient.   ASSESSMENT AUB, perimenopausal. Normal biopsy (atrophic), on provera bleeding has stopped. Ultrasound with multiple small myomas, stripe 4.8 mm.  Contraception, discussed the importance of contraception with all of her medical issues Cystitis    PLAN Continue to provera for another 10 days Discussed the option of the mirena IUD, or condoms (with cyclic provera) She would like to have the mirena Treat cystitis with bactrim (Creatinine clearance over 30) Discussed iron for her mild anemia, information given   An After Visit Summary was printed and given to the patient.  ~25 minutes face to face time of which over 50% was spent in counseling.   Cc: Dr Chapman Fitch, Dr Margarita Rana

## 2018-11-13 NOTE — Patient Instructions (Addendum)
[facility], [address] [place], [place] [address] [phone]  Take the provera for another 10 days  For iron you can try: Ferrex 150 mg Or slow Fe Or ferrous gluconate  You can take magnesium 500 mg a day to help prevent constipation   Urinary Tract Infection, Adult A urinary tract infection (UTI) is an infection of any part of the urinary tract. The urinary tract includes:  The kidneys.  The ureters.  The bladder.  The urethra. These organs make, store, and get rid of pee (urine) in the body. What are the causes? This is caused by germs (bacteria) in your genital area. These germs grow and cause swelling (inflammation) of your urinary tract. What increases the risk? You are more likely to develop this condition if:  You have a small, thin tube (catheter) to drain pee.  You cannot control when you pee or poop (incontinence).  You are female, and: ? You use these methods to prevent pregnancy: ? A medicine that kills sperm (spermicide). ? A device that blocks sperm (diaphragm). ? You have low levels of a female hormone (estrogen). ? You are pregnant.  You have genes that add to your risk.  You are sexually active.  You take antibiotic medicines.  You have trouble peeing because of: ? A prostate that is bigger than normal, if you are female. ? A blockage in the part of your body that drains pee from the bladder (urethra). ? A kidney stone. ? A nerve condition that affects your bladder (neurogenic bladder). ? Not getting enough to drink. ? Not peeing often enough.  You have other conditions, such as: ? Diabetes. ? A weak disease-fighting system (immune system). ? Sickle cell disease. ? Gout. ? Injury of the spine. What are the signs or symptoms? Symptoms of this condition include:  Needing to pee right away (urgently).  Peeing often.  Peeing small amounts often.  Pain or burning when  peeing.  Blood in the pee.  Pee that smells bad or not like normal.  Trouble peeing.  Pee that is cloudy.  Fluid coming from the vagina, if you are female.  Pain in the belly or lower back. Other symptoms include:  Throwing up (vomiting).  No urge to eat.  Feeling mixed up (confused).  Being tired and grouchy (irritable).  A fever.  Watery poop (diarrhea). How is this treated? This condition may be treated with:  Antibiotic medicine.  Other medicines.  Drinking enough water. Follow these instructions at home:  Medicines  Take over-the-counter and prescription medicines only as told by your doctor.  If you were prescribed an antibiotic medicine, take it as told by your doctor. Do not stop taking it even if you start to feel better. General instructions  Make sure you: ? Pee until your bladder is empty. ? Do not hold pee for a long time. ? Empty your bladder after sex. ? Wipe from front to back after pooping if you are a female. Use each tissue one time when you wipe.  Drink enough fluid to keep your pee pale yellow.  Keep all follow-up visits as told by your doctor. This is important. Contact a doctor if:  You do not get better after 1-2 days.  Your symptoms go away and then come back. Get help right away if:  You have very bad back pain.  You have very bad pain in your lower belly.  You have  a fever.  You are sick to your stomach (nauseous).  You are throwing up. Summary  A urinary tract infection (UTI) is an infection of any part of the urinary tract.  This condition is caused by germs in your genital area.  There are many risk factors for a UTI. These include having a small, thin tube to drain pee and not being able to control when you pee or poop.  Treatment includes antibiotic medicines for germs.  Drink enough fluid to keep your pee pale yellow. This information is not intended to replace advice given to you by your health care  provider. Make sure you discuss any questions you have with your health care provider. Document Released: 11/28/2007 Document Revised: 12/19/2017 Document Reviewed: 12/19/2017 Elsevier Interactive Patient Education  2019 [employer].

## 2018-11-14 ENCOUNTER — Ambulatory Visit (HOSPITAL_COMMUNITY): Payer: Self-pay

## 2018-11-14 LAB — URINALYSIS, MICROSCOPIC ONLY
Casts: NONE SEEN /lpf
WBC, UA: >30 /hpf — AB (ref 0–5)

## 2018-11-14 IMAGING — CR DG CHEST 2V
2 series · 2 of 2 positions shown · non-contrast
Comparison: 01/24/2018

CLINICAL DATA: Nausea, vomiting and diaphoresis. CABG approximately
2 weeks ago.

EXAM:
CHEST - 2 VIEW

[chest lat]
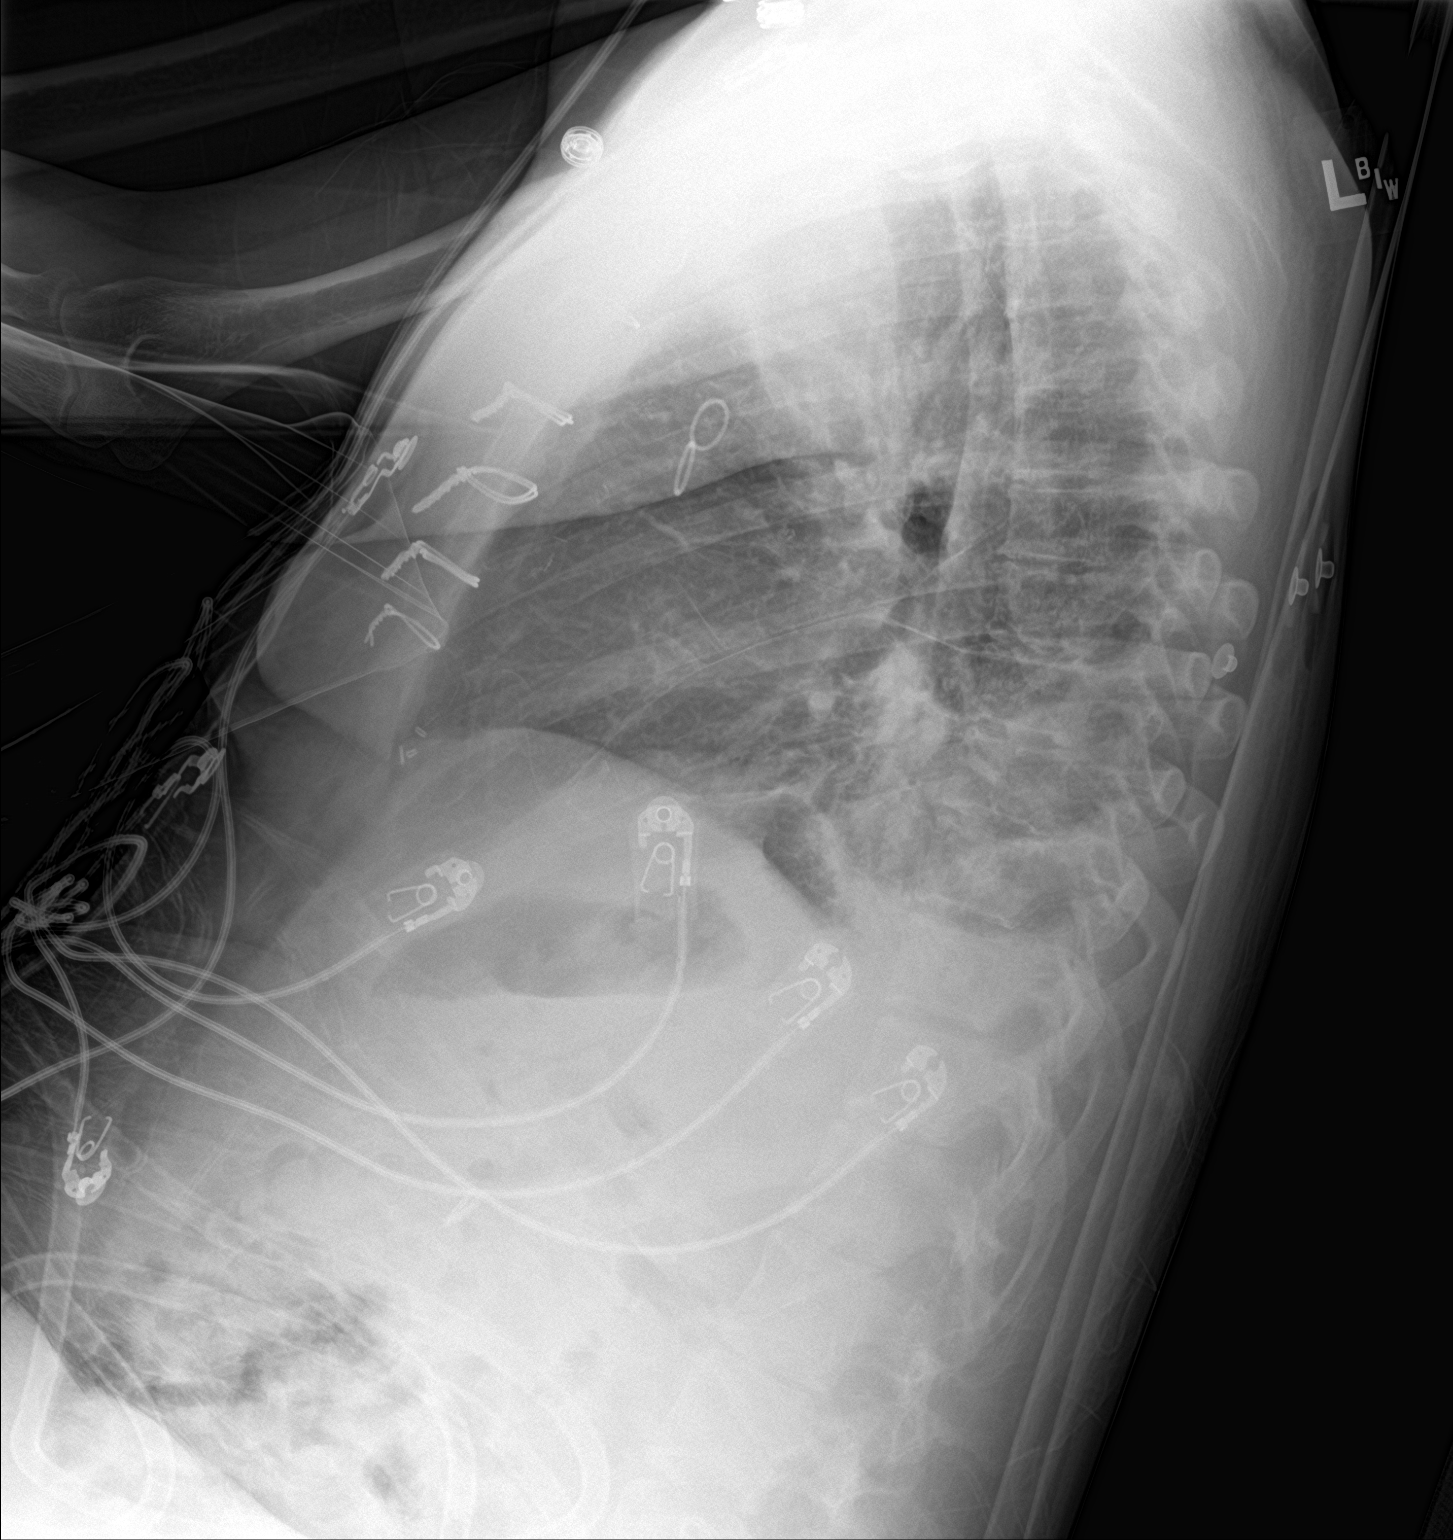

[chest ap]
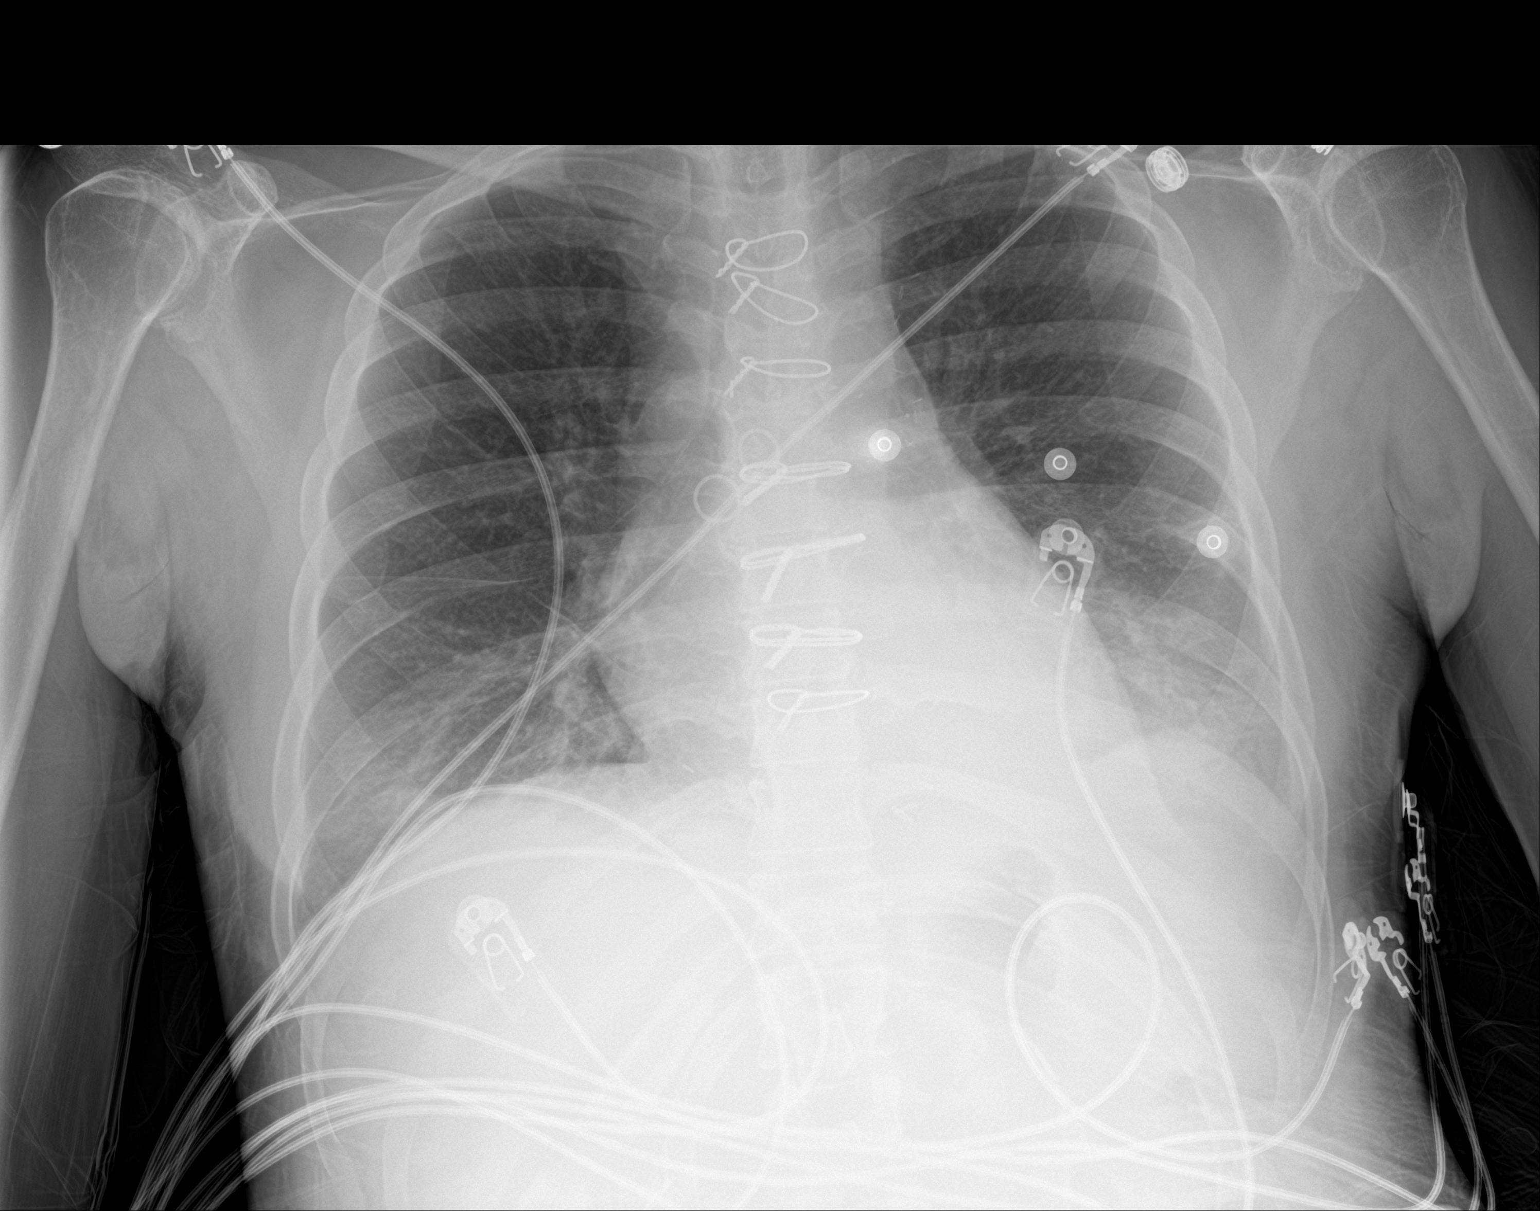

[2 of 2 positions shown; findings below may reference images not displayed]

FINDINGS: The patient is status post CABG with mild cardiomegaly, stable in
appearance. Hazy opacities are noted at the lung bases left greater
than right compatible with atelectasis and/or pneumonia. Posterior
small pleural effusions are identified. Improved aeration at the
right lung base since prior. No acute osseous abnormality. PICC line
has been removed in the interim.
IMPRESSION: Persistent left basilar opacity suspicious for pneumonia and/or
atelectasis. Posterior small pleural effusions are present as
before. Improved aeration of the right lung base since the interim.

## 2018-11-15 LAB — URINE CULTURE

## 2018-11-18 ENCOUNTER — Telehealth: Payer: Self-pay

## 2018-11-18 NOTE — Telephone Encounter (Signed)
Spoke with patient. Advised of results as seen below. Patient verbalizes understanding. Patient is sexually active. No form of contraception. LMP 11/01/2018. Patient is aware she will need to contact the office on the first day of her menses to schedule colposcopy. Will hold in results.  Routing to provider and will close encounter.

## 2018-11-18 NOTE — Telephone Encounter (Signed)
Spoke with patient. Results given. Patient verbalizes understanding. States she is feeling well and is no longer having any symptoms. Encounter closed.  Notes recorded by Salvadore Dom, MD on 11/17/2018 at 5:19 PM EDT Please let the patient know that she did have a UTI and was treated with the correct antibiotic. Please check that she is feeling better. ------  Notes recorded by Salvadore Dom, MD on 11/14/2018 at 11:04 AM EDT Patient is on Bactrim DS, will await culture and sensitivities.

## 2018-11-18 NOTE — Telephone Encounter (Signed)
-----   Message from Salvadore Dom, MD sent at 11/14/2018 10:32 AM EDT ----- +hpv 16 subtyping. She needs a colposcopy, please schedule

## 2018-11-19 ENCOUNTER — Ambulatory Visit (HOSPITAL_COMMUNITY): Payer: Self-pay

## 2018-11-21 ENCOUNTER — Ambulatory Visit (HOSPITAL_COMMUNITY): Payer: Self-pay

## 2018-11-24 ENCOUNTER — Ambulatory Visit (HOSPITAL_COMMUNITY): Payer: Self-pay

## 2018-11-26 ENCOUNTER — Ambulatory Visit (HOSPITAL_COMMUNITY): Payer: Self-pay

## 2018-11-26 ENCOUNTER — Other Ambulatory Visit: Payer: Self-pay

## 2018-11-26 ENCOUNTER — Telehealth (HOSPITAL_COMMUNITY): Payer: Self-pay | Admitting: *Deleted

## 2018-11-26 ENCOUNTER — Encounter (HOSPITAL_COMMUNITY): Payer: Self-pay | Admitting: Emergency Medicine

## 2018-11-26 ENCOUNTER — Emergency Department (HOSPITAL_COMMUNITY): Payer: Medicare HMO

## 2018-11-26 ENCOUNTER — Emergency Department (HOSPITAL_COMMUNITY)
Admission: EM | Admit: 2018-11-26 | Discharge: 2018-11-26 | Disposition: A | Discharge status: Home / Self Care | Payer: Medicare HMO | Attending: Emergency Medicine | Admitting: Emergency Medicine

## 2018-11-26 DIAGNOSIS — E1165 Type 2 diabetes mellitus with hyperglycemia: Secondary | ICD-10-CM | POA: Diagnosis not present

## 2018-11-26 DIAGNOSIS — Z79899 Other long term (current) drug therapy: Secondary | ICD-10-CM | POA: Insufficient documentation

## 2018-11-26 DIAGNOSIS — I10 Essential (primary) hypertension: Secondary | ICD-10-CM | POA: Insufficient documentation

## 2018-11-26 DIAGNOSIS — R1084 Generalized abdominal pain: Secondary | ICD-10-CM | POA: Insufficient documentation

## 2018-11-26 DIAGNOSIS — R000 Tachycardia, unspecified: Secondary | ICD-10-CM | POA: Diagnosis not present

## 2018-11-26 DIAGNOSIS — F29 Unspecified psychosis not due to a substance or known physiological condition: Secondary | ICD-10-CM | POA: Diagnosis not present

## 2018-11-26 DIAGNOSIS — Z951 Presence of aortocoronary bypass graft: Secondary | ICD-10-CM | POA: Diagnosis not present

## 2018-11-26 DIAGNOSIS — Z794 Long term (current) use of insulin: Secondary | ICD-10-CM | POA: Insufficient documentation

## 2018-11-26 DIAGNOSIS — I2510 Atherosclerotic heart disease of native coronary artery without angina pectoris: Secondary | ICD-10-CM | POA: Diagnosis not present

## 2018-11-26 DIAGNOSIS — I252 Old myocardial infarction: Secondary | ICD-10-CM | POA: Diagnosis not present

## 2018-11-26 DIAGNOSIS — E109 Type 1 diabetes mellitus without complications: Secondary | ICD-10-CM | POA: Insufficient documentation

## 2018-11-26 DIAGNOSIS — R52 Pain, unspecified: Secondary | ICD-10-CM | POA: Diagnosis not present

## 2018-11-26 DIAGNOSIS — R4182 Altered mental status, unspecified: Secondary | ICD-10-CM | POA: Diagnosis present

## 2018-11-26 LAB — AMMONIA: Ammonia: 15 umol/L (ref 9–35)

## 2018-11-26 LAB — URINALYSIS, ROUTINE W REFLEX MICROSCOPIC
Bilirubin Urine: NEGATIVE
Glucose, UA: >=500 mg/dL — AB
Ketones, ur: 80 mg/dL — AB
Nitrite: NEGATIVE
Protein, ur: 100 mg/dL — AB
Specific Gravity, Urine: 1.022 (ref 1.005–1.030)
pH: 6 (ref 5.0–8.0)

## 2018-11-26 LAB — BLOOD GAS, VENOUS
Acid-base deficit: 7.3 mmol/L — ABNORMAL HIGH (ref 0.0–2.0)
Bicarbonate: 15.9 mmol/L — ABNORMAL LOW (ref 20.0–28.0)
O2 Saturation: 70.6 %
Patient temperature: 98.6
pCO2, Ven: 26.6 mmHg — ABNORMAL LOW (ref 44.0–60.0)
pH, Ven: 7.393 (ref 7.250–7.430)
pO2, Ven: 37.5 mmHg (ref 32.0–45.0)

## 2018-11-26 LAB — CBC WITH DIFFERENTIAL/PLATELET
Abs Immature Granulocytes: 0.04 10*3/uL (ref 0.00–0.07)
Basophils Absolute: 0 10*3/uL (ref 0.0–0.1)
Basophils Relative: 0 %
Eosinophils Absolute: 0.1 10*3/uL (ref 0.0–0.5)
Eosinophils Relative: 0 %
HCT: 36.8 % (ref 36.0–46.0)
Hemoglobin: 11.9 g/dL — ABNORMAL LOW (ref 12.0–15.0)
Immature Granulocytes: 0 %
Lymphocytes Relative: 4 %
Lymphs Abs: 0.5 10*3/uL — ABNORMAL LOW (ref 0.7–4.0)
MCH: 28.9 pg (ref 26.0–34.0)
MCHC: 32.3 g/dL (ref 30.0–36.0)
MCV: 89.3 fL (ref 80.0–100.0)
Monocytes Absolute: 0.4 10*3/uL (ref 0.1–1.0)
Monocytes Relative: 3 %
Neutro Abs: 13.2 10*3/uL — ABNORMAL HIGH (ref 1.7–7.7)
Neutrophils Relative %: 93 %
Platelets: 339 10*3/uL (ref 150–400)
RBC: 4.12 MIL/uL (ref 3.87–5.11)
RDW: 12.1 % (ref 11.5–15.5)
WBC: 14.3 10*3/uL — ABNORMAL HIGH (ref 4.0–10.5)
nRBC: 0 % (ref 0.0–0.2)

## 2018-11-26 LAB — COMPREHENSIVE METABOLIC PANEL
ALT: 15 U/L (ref 0–44)
AST: 22 U/L (ref 15–41)
Albumin: 4.4 g/dL (ref 3.5–5.0)
Alkaline Phosphatase: 89 U/L (ref 38–126)
Anion gap: 15 (ref 5–15)
BUN: 21 mg/dL — ABNORMAL HIGH (ref 6–20)
CO2: 16 mmol/L — ABNORMAL LOW (ref 22–32)
Calcium: 9.4 mg/dL (ref 8.9–10.3)
Chloride: 107 mmol/L (ref 98–111)
Creatinine, Ser: 1.2 mg/dL — ABNORMAL HIGH (ref 0.44–1.00)
GFR calc Af Amer: >60 mL/min (ref >60)
GFR calc non Af Amer: 52 mL/min — ABNORMAL LOW (ref >60)
Glucose, Bld: 374 mg/dL — ABNORMAL HIGH (ref 70–99)
Potassium: 3.4 mmol/L — ABNORMAL LOW (ref 3.5–5.1)
Sodium: 138 mmol/L (ref 135–145)
Total Bilirubin: 1.4 mg/dL — ABNORMAL HIGH (ref 0.3–1.2)
Total Protein: 8.4 g/dL — ABNORMAL HIGH (ref 6.5–8.1)

## 2018-11-26 LAB — RAPID URINE DRUG SCREEN, HOSP PERFORMED
Amphetamines: NOT DETECTED
Barbiturates: NOT DETECTED
Benzodiazepines: NOT DETECTED
Cocaine: NOT DETECTED
Opiates: NOT DETECTED
Tetrahydrocannabinol: NOT DETECTED

## 2018-11-26 LAB — ETHANOL: Alcohol, Ethyl (B): <10 mg/dL (ref <10)

## 2018-11-26 LAB — CBG MONITORING, ED
Glucose-Capillary: 302 mg/dL — ABNORMAL HIGH (ref 70–99)
Glucose-Capillary: 364 mg/dL — ABNORMAL HIGH (ref 70–99)
Glucose-Capillary: 345 mg/dL — ABNORMAL HIGH (ref 70–99)

## 2018-11-26 MED ORDER — SODIUM CHLORIDE 0.9 % IV BOLUS
3000.0000 mL | Freq: Once | INTRAVENOUS | Status: AC
  Administered 2018-11-26: 3000 mL via INTRAVENOUS

## 2018-11-26 MED ORDER — OXYCODONE-ACETAMINOPHEN 5-325 MG PO TABS
1.0000 | ORAL_TABLET | Freq: Once | ORAL | Status: AC
  Administered 2018-11-26: 22:00:00 1 via ORAL
  Filled 2018-11-26: qty 1

## 2018-11-26 MED ORDER — FENTANYL CITRATE (PF) 100 MCG/2ML IJ SOLN
100.0000 ug | INTRAMUSCULAR | Status: DC | PRN
  Administered 2018-11-26: 100 ug via INTRAVENOUS
  Filled 2018-11-26: qty 2

## 2018-11-26 MED ORDER — ONDANSETRON HCL 4 MG/2ML IJ SOLN
4.0000 mg | Freq: Once | INTRAMUSCULAR | Status: AC
  Administered 2018-11-26: 4 mg via INTRAVENOUS
  Filled 2018-11-26: qty 2

## 2018-11-26 MED ORDER — HALOPERIDOL LACTATE 5 MG/ML IJ SOLN
2.0000 mg | Freq: Once | INTRAMUSCULAR | Status: AC
  Administered 2018-11-26: 17:00:00 2 mg via INTRAVENOUS
  Filled 2018-11-26: qty 1

## 2018-11-26 NOTE — Discharge Instructions (Signed)
As discussed, your evaluation today has been largely reassuring.  But, it is important that you monitor your condition carefully, and do not hesitate to return to the ED if you develop new, or concerning changes in your condition. ? ?Otherwise, please follow-up with your physician for appropriate ongoing care. ? ?

## 2018-11-26 NOTE — ED Notes (Signed)
Bed: RN16 Expected date:  Expected time:  Means of arrival:  Comments: EMS: Altered Mental Status

## 2018-11-26 NOTE — Telephone Encounter (Addendum)
PC to pt inquire about participating in virtual cardiac rehab while department is closed d/t COVID-19.  No answer. LMTCB.

## 2018-11-26 NOTE — ED Notes (Signed)
Pt is alert and oriented x 4  with eye opening response. Pt is cooperative and follows command. Pt is slow to respond, and does report generalized abdominal pain, and appears to have some spasms in her abdomen, with associated nausea.

## 2018-11-26 NOTE — ED Notes (Signed)
IV team still in room.  Pt had water BM

## 2018-11-26 NOTE — ED Notes (Addendum)
Patient states yall do know I have Gastroparesis.

## 2018-11-26 NOTE — ED Triage Notes (Signed)
Pt arrived via EMS from home. Pt dtr called because of sugar, per EMS pt has altered mental status and is not at her baseline and is making a moaning sound. Pt will respond to name, pt dtr reports that she has had similar symptoms in the past and is usually related to elevated blood sugars. Pt had episode of vomiting. Given mg Zofran IM en route.   EMS BP 156/93, CBG 327, HR 108, RR 22, O2 sat 100 % RA,

## 2018-11-26 NOTE — ED Provider Notes (Signed)
[facility] DEPT Provider Note   CSN: [id number] Arrival date & time: 11/26/18  1334    History   Chief Complaint Chief Complaint  Patient presents with  . Altered Mental Status    HPI [name] is a 53 y.o. female.     HPI   She presents for evaluation of elevated blood sugar, and altered mental status.  She was transferred by EMS.  During transfer, she was found to have elevated blood sugar and vomited at least once.  On arrival patient is slow to respond but complains of stomach pain.  She is unable to give any additional history.  Level 5 caveat-altered mental status  Past Medical History:  Diagnosis Date  . AKI (acute kidney injury) ([place])   . Anemia, iron deficiency   . Anxiety and depression 05/18/2015  . CAD (coronary artery disease), native coronary artery 01/11/2018   Non-STEMI with severe three-vessel disease noted July 2019  . Depression   . Diabetes mellitus without complication ([place])   . Diabetic gastroparesis ([place])   . Diabetic neuropathy, type I diabetes mellitus ([place]) 05/18/2015  . DKA (diabetic ketoacidoses) ([place]) 06/16/2017  . DKA, type 1 ([place]) 03/18/2016  . Essential hypertension   . Gastroparesis   . GERD (gastroesophageal reflux disease)   . HLD (hyperlipidemia)   . MDD (major depressive disorder), single episode, severe , no psychosis ([place])   . Non-ST elevation (NSTEMI) myocardial infarction ([place])   . S/P CABG x 3 01/14/2018  . Tardive dyskinesia   . Vitamin B12 deficiency 08/16/2015    Patient Active Problem List   Diagnosis Date Noted  . Diabetic ketoacidosis ([place]) 04/30/2018  . Gastroparesis 04/13/2018  . DKA (diabetic ketoacidosis) ([place]) 03/31/2018  . S/P CABG x 3 01/14/2018  . CAD (coronary artery disease), native coronary artery 01/11/2018  . Dehydration   . MDD (major depressive disorder), single episode, severe , no psychosis ([place])   . DKA (diabetic ketoacidoses) ([place]) 06/16/2017  . Tardive  dyskinesia   . DKA, type 1 ([place]) 03/18/2016  . Noncompliance with treatment plan 03/13/2016  . Essential hypertension 01/24/2016  . HLD (hyperlipidemia)   . GERD (gastroesophageal reflux disease)   . AKI (acute kidney injury) ([place])   . Anemia, iron deficiency   . Vitamin B12 deficiency 08/16/2015  . Diabetic gastroparesis ([place])   . Diabetic neuropathy, type I diabetes mellitus ([place]) 05/18/2015  . Anxiety and depression 05/18/2015    Past Surgical History:  Procedure Laterality Date  . CARDIAC SURGERY    . COLONOSCOPY  09/27/2014   at [facility]  . CORONARY ARTERY BYPASS GRAFT N/A 01/14/2018   Procedure: CORONARY ARTERY BYPASS GRAFTING (CABG)X3, RIGHT AND LEFT SAPHENOUS VEIN HARVEST, MAMMARY TAKE DOWN. MAMMARY TO LAD, SVG TO PD, SVG TO DISTAL CIRC.;  Surgeon: Grace Isaac, MD;  Location: [facility];  Service: Open Heart Surgery;  Laterality: N/A;  . ESOPHAGOGASTRODUODENOSCOPY  09/27/2014   at [facility], Dr Rolan Lipa. biospy neg for celiac, neg for H pylori.   . EYE SURGERY    . gailstones    . IR FLUORO GUIDE CV LINE RIGHT  02/01/2017  . IR FLUORO GUIDE CV LINE RIGHT  03/06/2017  . IR FLUORO GUIDE CV LINE RIGHT  03/25/2017  . IR GENERIC HISTORICAL  01/24/2016   IR FLUORO GUIDE CV LINE RIGHT 01/24/2016 Darrell K Allred, PA-C [facility]  . IR GENERIC HISTORICAL  01/24/2016   IR US GUIDE VASC ACCESS RIGHT 01/24/2016 Darrell K Allred, PA-C  [facility]  . IR US GUIDE VASC ACCESS RIGHT  02/01/2017  . IR US GUIDE VASC ACCESS RIGHT  03/06/2017  . IR US GUIDE VASC ACCESS RIGHT  03/25/2017  . LEFT HEART CATH AND CORONARY ANGIOGRAPHY N/A 01/07/2018   Procedure: LEFT HEART CATH AND CORONARY ANGIOGRAPHY;  Surgeon: Troy Sine, MD;  Location: [facility] CV LAB;  Service: Cardiovascular;  Laterality: N/A;  . POSTERIOR VITRECTOMY AND MEMBRANE PEEL-LEFT EYE  09/28/2002  . POSTERIOR VITRECTOMY AND MEMBRANE PEEL-RIGHT EYE  03/16/2002  . RETINAL DETACHMENT SURGERY    . TEE WITHOUT CARDIOVERSION N/A 01/14/2018    Procedure: TRANSESOPHAGEAL ECHOCARDIOGRAM (TEE);  Surgeon: Grace Isaac, MD;  Location: [facility];  Service: Open Heart Surgery;  Laterality: N/A;     OB History    Gravida  2   Para      Term      Preterm      AB  1   Living  1     SAB      TAB  1   Ectopic      Multiple      Live Births               Home Medications    Prior to Admission medications   Medication Sig Start Date End Date Taking? Authorizing Provider  ammonium lactate (AMLACTIN) 12 % cream Apply topically as needed for dry skin. 07/08/18   Trula Slade, DPM  aspirin EC 81 MG tablet Take 1 tablet (81 mg total) by mouth daily. 06/03/18   Nahser, Wonda Cheng, MD  Blood Glucose Monitoring Suppl (ONETOUCH VERIO) w/Device KIT Use as directed 03/21/18   [name], MD  busPIRone (BUSPAR) 10 MG tablet Take 1 tablet (10 mg total) by mouth 3 (three) times daily. 06/12/18   [name], MD  diphenhydrAMINE (BENADRYL) 25 mg capsule Take 25 mg by mouth every 6 (six) hours as needed for allergies.    [provider]  docusate sodium (COLACE) 100 MG capsule Take 1 capsule (100 mg total) by mouth daily as needed for mild constipation. 02/03/18   Burtis Junes, NP  furosemide (LASIX) 40 MG tablet Take 1 tablet (40 mg total) by mouth daily as needed for fluid or edema. 06/12/18 06/07/19  [name], MD  glucose blood (ACCU-CHEK AVIVA PLUS) test strip Use as instructed to test blood sugar 5 times daily Dx E10.65 09/22/18   Elayne Snare, MD  insulin aspart (NOVOLOG) 100 UNIT/ML injection Inject 0-12 Units into the skin 3 (three) times daily before meals. As per sliding scale 10/14/18   Elayne Snare, MD  Insulin Glargine (LANTUS SOLOSTAR) 100 UNIT/ML Solostar Pen Inject 15 units under the skin once daily in the morning. 10/14/18   Elayne Snare, MD  Insulin Syringe-Needle U-100 (BD INSULIN SYRINGE ULTRAFINE) 31G X 5/16" 0.5 ML MISC 1 each by Does not apply route 4 (four) times daily. 04/28/18   Elayne Snare, MD  Lancets [facility] DELICA PLUS [id number]) [place] 1 Units by Does not apply route as directed. 03/21/18   [name], MD  loperamide (IMODIUM) 2 MG capsule Take 1 capsule (2 mg total) by mouth as needed for diarrhea or loose stools. 04/15/18   Desiree Hane, MD  losartan (COZAAR) 25 MG tablet Take 1 tablet (25 mg total) by mouth daily. 10/08/18   Nahser, Wonda Cheng, MD  medroxyPROGESTERone (PROVERA) 10 MG tablet 1 tablet po BID until bleeding stops, then go down to one tablet  a day 11/05/18   Salvadore Dom, MD  metoprolol tartrate (LOPRESSOR) 25 MG tablet Take 0.5 tablets (12.5 mg total) by mouth 2 (two) times daily. 10/08/18   Nahser, Wonda Cheng, MD  pantoprazole (PROTONIX) 40 MG tablet Take 1 tablet (40 mg total) by mouth daily. 06/12/18   [name], MD  potassium chloride (K-DUR) 10 MEQ tablet Take 1 tablet (10 mEq total) by mouth daily. Patient taking differently: Take 10 mEq by mouth as needed.  06/12/18   [name], MD  pregabalin (LYRICA) 150 MG capsule Take 1 capsule (150 mg total) by mouth 2 (two) times daily. 06/13/18   [name], MD  promethazine (PHENERGAN) 12.5 MG tablet Take 1 tablet (12.5 mg total) by mouth every 6 (six) hours as needed for nausea or vomiting. 05/02/18   Ghimire, Henreitta Leber, MD  pyridostigmine (MESTINON) 60 MG tablet Take 60 mg by mouth 3 (three) times daily.    [provider]  sulfamethoxazole-trimethoprim (BACTRIM DS) 800-160 MG tablet Take 1 tablet by mouth 2 (two) times daily. One PO BID x 3 days 11/13/18   Salvadore Dom, MD    Family History Family History  Problem Relation Age of Onset  . Cystic fibrosis Mother   . Hypertension Father   . Diabetes Brother   . Hypertension Maternal Grandmother     Social History Social History   Tobacco Use  . Smoking status: Never Smoker  . Smokeless tobacco: Never Used  Substance Use Topics  . Alcohol use: No  . Drug use: Yes    Types: Marijuana    Comment: occ      Allergies   Anesthetics, amide; Penicillins; Buprenorphine hcl; Encainide; and Metoclopramide   Review of Systems Review of Systems  Unable to perform ROS: Mental status change     Physical Exam Updated Vital Signs BP (!) 116/91   Pulse (!) 107   Temp 100.1 F (37.8 C) (Oral) Comment: RN,Jenene made aware of pt. elevated temperature.  Resp (!) 24   Ht 5' 1"  (1.549 m)   Wt 68.9 kg   LMP 11/01/2018 (Exact Date) Comment: april 26th cycle before may bleeding  SpO2 100%   BMI 28.72 kg/m   Physical Exam Vitals signs and nursing note reviewed.  Constitutional:      General: She is in acute distress.     Appearance: She is well-developed. She is ill-appearing and toxic-appearing. She is not diaphoretic.  HENT:     Head: Normocephalic and atraumatic.     Right Ear: External ear normal.     Left Ear: External ear normal.     Mouth/Throat:     Pharynx: No oropharyngeal exudate or posterior oropharyngeal erythema.  Eyes:     Conjunctiva/sclera: Conjunctivae normal.     Pupils: Pupils are equal, round, and reactive to light.  Neck:     Musculoskeletal: Normal range of motion and neck supple.     Trachea: Phonation normal.  Cardiovascular:     Rate and Rhythm: Normal rate and regular rhythm.     Heart sounds: Normal heart sounds.  Pulmonary:     Effort: Pulmonary effort is normal.     Breath sounds: Normal breath sounds.  Abdominal:     General: There is no distension.     Palpations: Abdomen is soft. There is no mass.     Tenderness: There is abdominal tenderness (Diffuse, moderate). There is no guarding or rebound.     Hernia: No hernia is present.  Musculoskeletal:  Normal range of motion.        General: No swelling or tenderness.  Skin:    General: Skin is warm and dry.  Neurological:     Mental Status: She is alert and oriented to person, place, and time.     Cranial Nerves: No cranial nerve deficit.     Sensory: No sensory deficit.     Motor: No abnormal muscle  tone.     Coordination: Coordination normal.  Psychiatric:        Attention and Perception: She is inattentive.        Mood and Affect: Mood is depressed.        Speech: Speech is delayed.        Behavior: Behavior is agitated.        Cognition and Memory: Cognition is impaired.      ED Treatments / Results  Labs (all labs ordered are listed, but only abnormal results are displayed) Labs Reviewed  COMPREHENSIVE METABOLIC PANEL - Abnormal; Notable for the following components:      Result Value   Potassium 3.4 (*)    CO2 16 (*)    Glucose, Bld 374 (*)    BUN 21 (*)    Creatinine, Ser 1.20 (*)    Total Protein 8.4 (*)    Total Bilirubin 1.4 (*)    GFR calc non Af Amer 52 (*)    All other components within normal limits  CBC WITH DIFFERENTIAL/PLATELET - Abnormal; Notable for the following components:   WBC 14.3 (*)    Hemoglobin 11.9 (*)    Neutro Abs 13.2 (*)    Lymphs Abs 0.5 (*)    All other components within normal limits  URINALYSIS, ROUTINE W REFLEX MICROSCOPIC - Abnormal; Notable for the following components:   APPearance HAZY (*)    Glucose, UA >=500 (*)    Hgb urine dipstick MODERATE (*)    Ketones, ur 80 (*)    Protein, ur 100 (*)    Leukocytes,Ua SMALL (*)    Bacteria, UA MANY (*)    All other components within normal limits  BLOOD GAS, VENOUS - Abnormal; Notable for the following components:   pCO2, Ven 26.6 (*)    Bicarbonate 15.9 (*)    Acid-base deficit 7.3 (*)    All other components within normal limits  CBG MONITORING, ED - Abnormal; Notable for the following components:   Glucose-Capillary 302 (*)    All other components within normal limits  CBG MONITORING, ED - Abnormal; Notable for the following components:   Glucose-Capillary 364 (*)    All other components within normal limits  ETHANOL  RAPID URINE DRUG SCREEN, HOSP PERFORMED  AMMONIA  CBG MONITORING, ED    EKG None  Radiology Ct Abdomen Pelvis Wo Contrast  Result Date: 11/26/2018  CLINICAL DATA:  Generalized abdominal pain EXAM: CT ABDOMEN AND PELVIS WITHOUT CONTRAST TECHNIQUE: Multidetector CT imaging of the abdomen and pelvis was performed following the standard protocol without IV contrast. COMPARISON:  CT abdomen pelvis, 05/31/2015, pelvic ultrasound, 11/13/2018 FINDINGS: Lower chest: No acute abnormality.  Fluid in the lower esophagus. Hepatobiliary: No focal liver abnormality is seen. Status post cholecystectomy. No biliary dilatation. Pancreas: Unremarkable. No pancreatic ductal dilatation or surrounding inflammatory changes. Spleen: Normal in size without focal abnormality. Adrenals/Urinary Tract: Adrenal glands are unremarkable. Kidneys are normal, without renal calculi, focal lesion, or hydronephrosis. Bladder is unremarkable. Stomach/Bowel: Stomach is within normal limits. Appendix appears normal. No evidence of bowel wall thickening,  distention, or inflammatory changes. Vascular/Lymphatic: Calcific atherosclerosis. No enlarged abdominal or pelvic lymph nodes. Reproductive: Calcified fibroid of the uterine fundus. 3.1 cm fluid attenuation lesion of the left ovary, in keeping with simple follicles seen on recent prior ultrasound. Other: No abdominal wall hernia or abnormality. No abdominopelvic ascites. Musculoskeletal: No acute or significant osseous findings. IMPRESSION: 1. No acute noncontrast CT findings of the abdomen or pelvis to explain pain. 2.  Fluid in the lower esophagus, which may be seen with reflux. 3.  Other chronic and incidental findings as detailed above. Electronically Signed   By: Eddie Candle M.D.   On: 11/26/2018 19:23    Procedures Procedures (including critical care time)  Medications Ordered in ED Medications  fentaNYL (SUBLIMAZE) injection 100 mcg (100 mcg Intravenous Given 11/26/18 2003)  oxyCODONE-acetaminophen (PERCOCET/ROXICET) 5-325 MG per tablet 1 tablet (has no administration in time range)  sodium chloride 0.9 % bolus 3,000 mL (3,000 mLs  Intravenous New Bag/Given 11/26/18 1623)  haloperidol lactate (HALDOL) injection 2 mg (2 mg Intravenous Given 11/26/18 1630)  ondansetron (ZOFRAN) injection 4 mg (4 mg Intravenous Given 11/26/18 2003)     Initial Impression / Assessment and Plan / ED Course  I have reviewed the triage vital signs and the nursing notes.  Pertinent labs & imaging results that were available during my care of the patient were reviewed by me and considered in my medical decision making (see chart for details).  Clinical Course as of Nov 26 2119  Wed Nov 26, 2018  1730 Normal except PCO2 low, bicarb low, acid-base high  Blood gas, venous(!) [EW]  1730 Abnormal-presence of glucose, hemoglobin, ketones, protein, leukocytes, white cells, bacteria, and increased squamous epithelials  Urinalysis, Routine w reflex microscopic(!) [EW]  1731 Normal  Ammonia [EW]  1731 Normal  Ethanol [EW]  1731 Normal except potassium low, CO2 low, glucose high, BUN high, creatinine high, total bilirubin high, GFR low  Comprehensive metabolic panel(!) [EW]  4680 Normal except white count high, hemoglobin low  CBC with Differential(!) [EW]  1918 At this point the patient is improved.  She is no longer confused.  She now is communicative and cooperative. She Continues to complain of abdominal pain.   [EW]  2116 Normal  Urine rapid drug screen (hosp performed) [EW]    Clinical Course User Index [EW] Daleen Bo, MD        Patient Vitals for the past 24 hrs:  BP Temp Temp src Pulse Resp SpO2 Height Weight  11/26/18 2100 (!) 116/91 - - (!) 107 (!) 24 100 % - -  11/26/18 2030 130/61 - - (!) 110 17 100 % - -  11/26/18 2007 - - - - - - 5' 1"  (1.549 m) 68.9 kg  11/26/18 2004 (!) 155/65 - - (!) 112 20 100 % - -  11/26/18 2000 (!) 146/136 - - (!) 115 (!) 33 100 % - -  11/26/18 1930 (!) 152/70 - - (!) 110 18 100 % - -  11/26/18 1912 (!) 152/69 100.1 F (37.8 C) Oral (!) 110 20 100 % - -  11/26/18 1900 - - - (!) 109 17 100 % - -   11/26/18 1540 (!) 166/74 - - (!) 108 (!) 29 100 % - -  11/26/18 1530 - - - (!) 105 (!) 39 100 % - -  11/26/18 1400 (!) 163/95 98.5 F (36.9 C) Oral (!) 104 17 100 % - -    9:0 PM Reevaluation with update and discussion. After  initial assessment and treatment, an updated evaluation reveals she is still uncomfortable and states she needs more pain medicine.Daleen Bo   Medical Decision Making: Nonspecific abdominal pain with reassuring evaluation.  Hyperglycemia without evidence for DKA.  Doubt serious bacterial infection, metabolic instability or impending vascular collapse.  No evidence for acute intra-abdominal abnormalities.  CRITICAL CARE-no Performed by: Daleen Bo  Nursing Notes Reviewed/ Care Coordinated Applicable Imaging Reviewed Interpretation of Laboratory Data incorporated into ED treatment  Plan-pain control in the ED and discharge by oncoming provider team, if felt appropriate.  Final Clinical Impressions(s) / ED Diagnoses   Final diagnoses:  Generalized abdominal pain    ED Discharge Orders    None       Daleen Bo, MD 11/26/18 2121

## 2018-11-26 NOTE — ED Notes (Signed)
IV team in the room at this time.

## 2018-11-27 ENCOUNTER — Other Ambulatory Visit: Payer: Self-pay | Admitting: Obstetrics and Gynecology

## 2018-11-27 ENCOUNTER — Other Ambulatory Visit: Payer: Medicare HMO

## 2018-11-27 NOTE — Telephone Encounter (Signed)
Refill of provera is not needed. She was planning on getting a mirena IUD, it was ordered. Can you please work on getting it scheduled. Thanks

## 2018-11-27 NOTE — Telephone Encounter (Signed)
Left message to call Briceyda Abdullah at [phone]. 

## 2018-11-27 NOTE — Telephone Encounter (Signed)
Medication refill request: Provera Last OV:  11/13/18 JJ Next AEX: none Last MMG (if hormonal medication request): never Refill authorized: Please advise if appropriate

## 2018-11-28 ENCOUNTER — Telehealth: Payer: Self-pay | Admitting: Obstetrics and Gynecology

## 2018-11-28 ENCOUNTER — Ambulatory Visit (HOSPITAL_COMMUNITY): Payer: Self-pay

## 2018-11-28 NOTE — Telephone Encounter (Signed)
Call placed to convey benefits for Mirena.

## 2018-12-01 ENCOUNTER — Ambulatory Visit (HOSPITAL_COMMUNITY): Payer: Self-pay

## 2018-12-01 IMAGING — DX DG CHEST 1V PORT
1 series · 1 of 1 positions shown · non-contrast
Comparison: 01/28/2018

CLINICAL DATA: Chest pain and vomiting.

EXAM:
PORTABLE CHEST 1 VIEW

[chest]
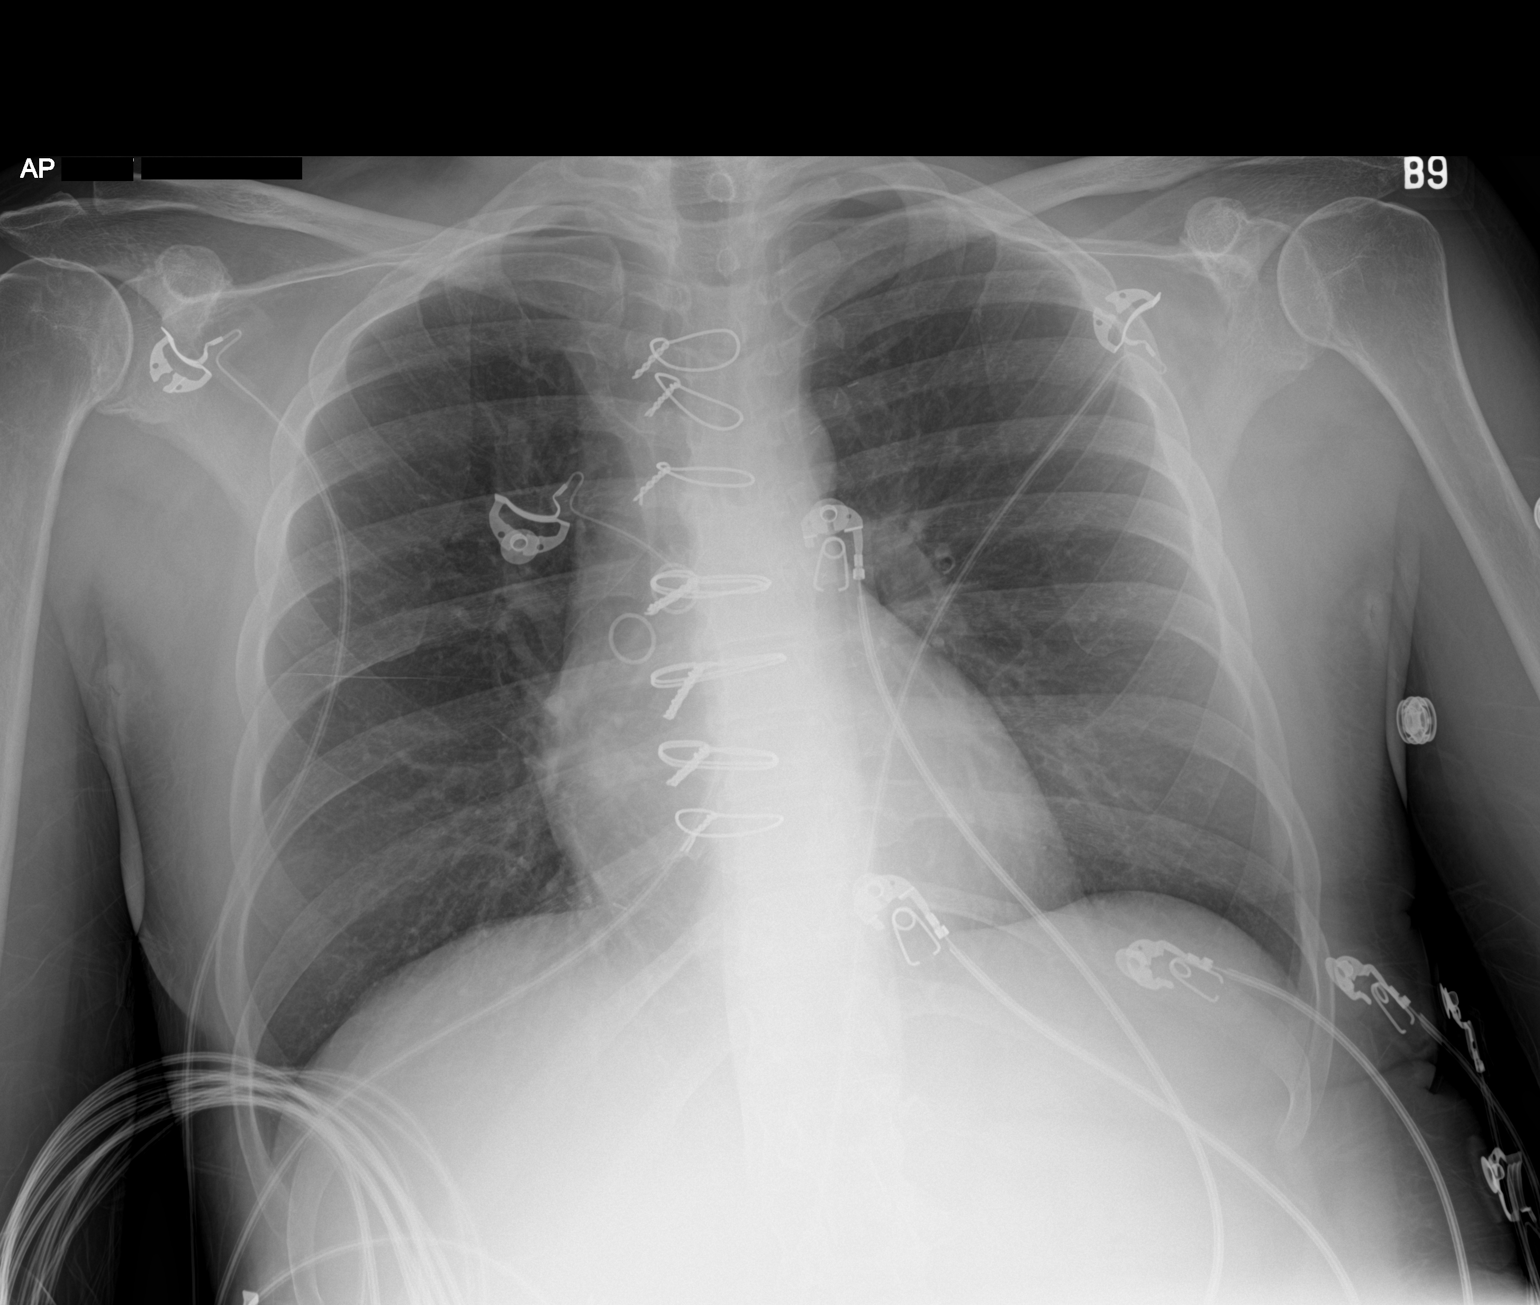

[1 of 1 positions shown; findings below may reference images not displayed]

FINDINGS: There is no focal parenchymal opacity. There is no pleural effusion
or pneumothorax. The heart and mediastinal contours are
unremarkable. There is evidence of prior CABG.

The osseous structures are unremarkable.
IMPRESSION: No active disease.

## 2018-12-02 ENCOUNTER — Telehealth (HOSPITAL_COMMUNITY): Payer: Self-pay | Admitting: *Deleted

## 2018-12-03 ENCOUNTER — Ambulatory Visit: Payer: Medicare HMO | Admitting: Podiatry

## 2018-12-03 ENCOUNTER — Ambulatory Visit (HOSPITAL_COMMUNITY): Payer: Self-pay

## 2018-12-04 ENCOUNTER — Ambulatory Visit: Payer: Medicare HMO | Admitting: Dietician

## 2018-12-04 IMAGING — DX DG CHEST 2V
2 series · 2 of 2 positions shown · non-contrast
Comparison: 02/14/2018

CLINICAL DATA: Follow-up CABG.  Shortness of breath with exertion.

EXAM:
CHEST - 2 VIEW

[dg chest 2 view (1 of 2)]
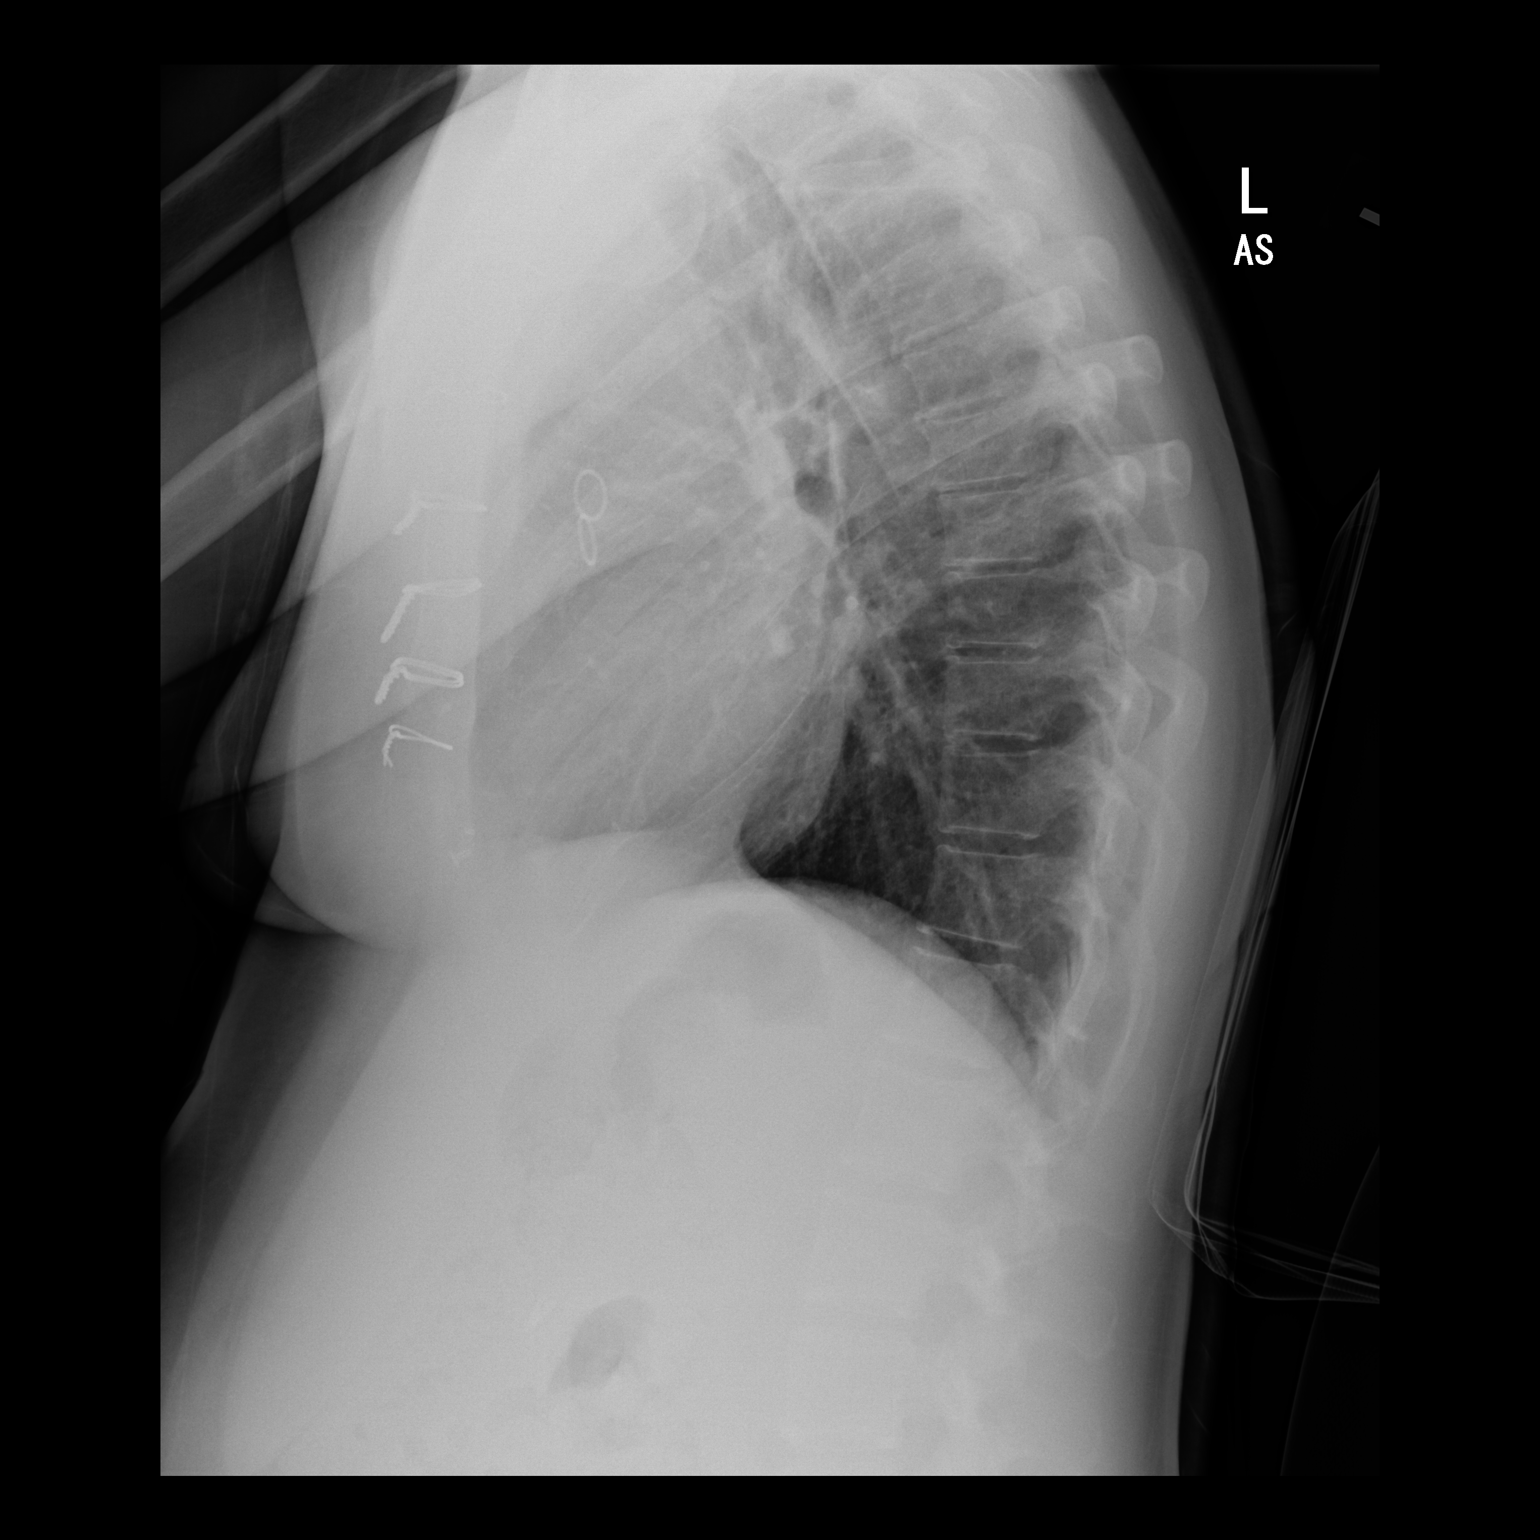

[dg chest 2 view (2 of 2)]
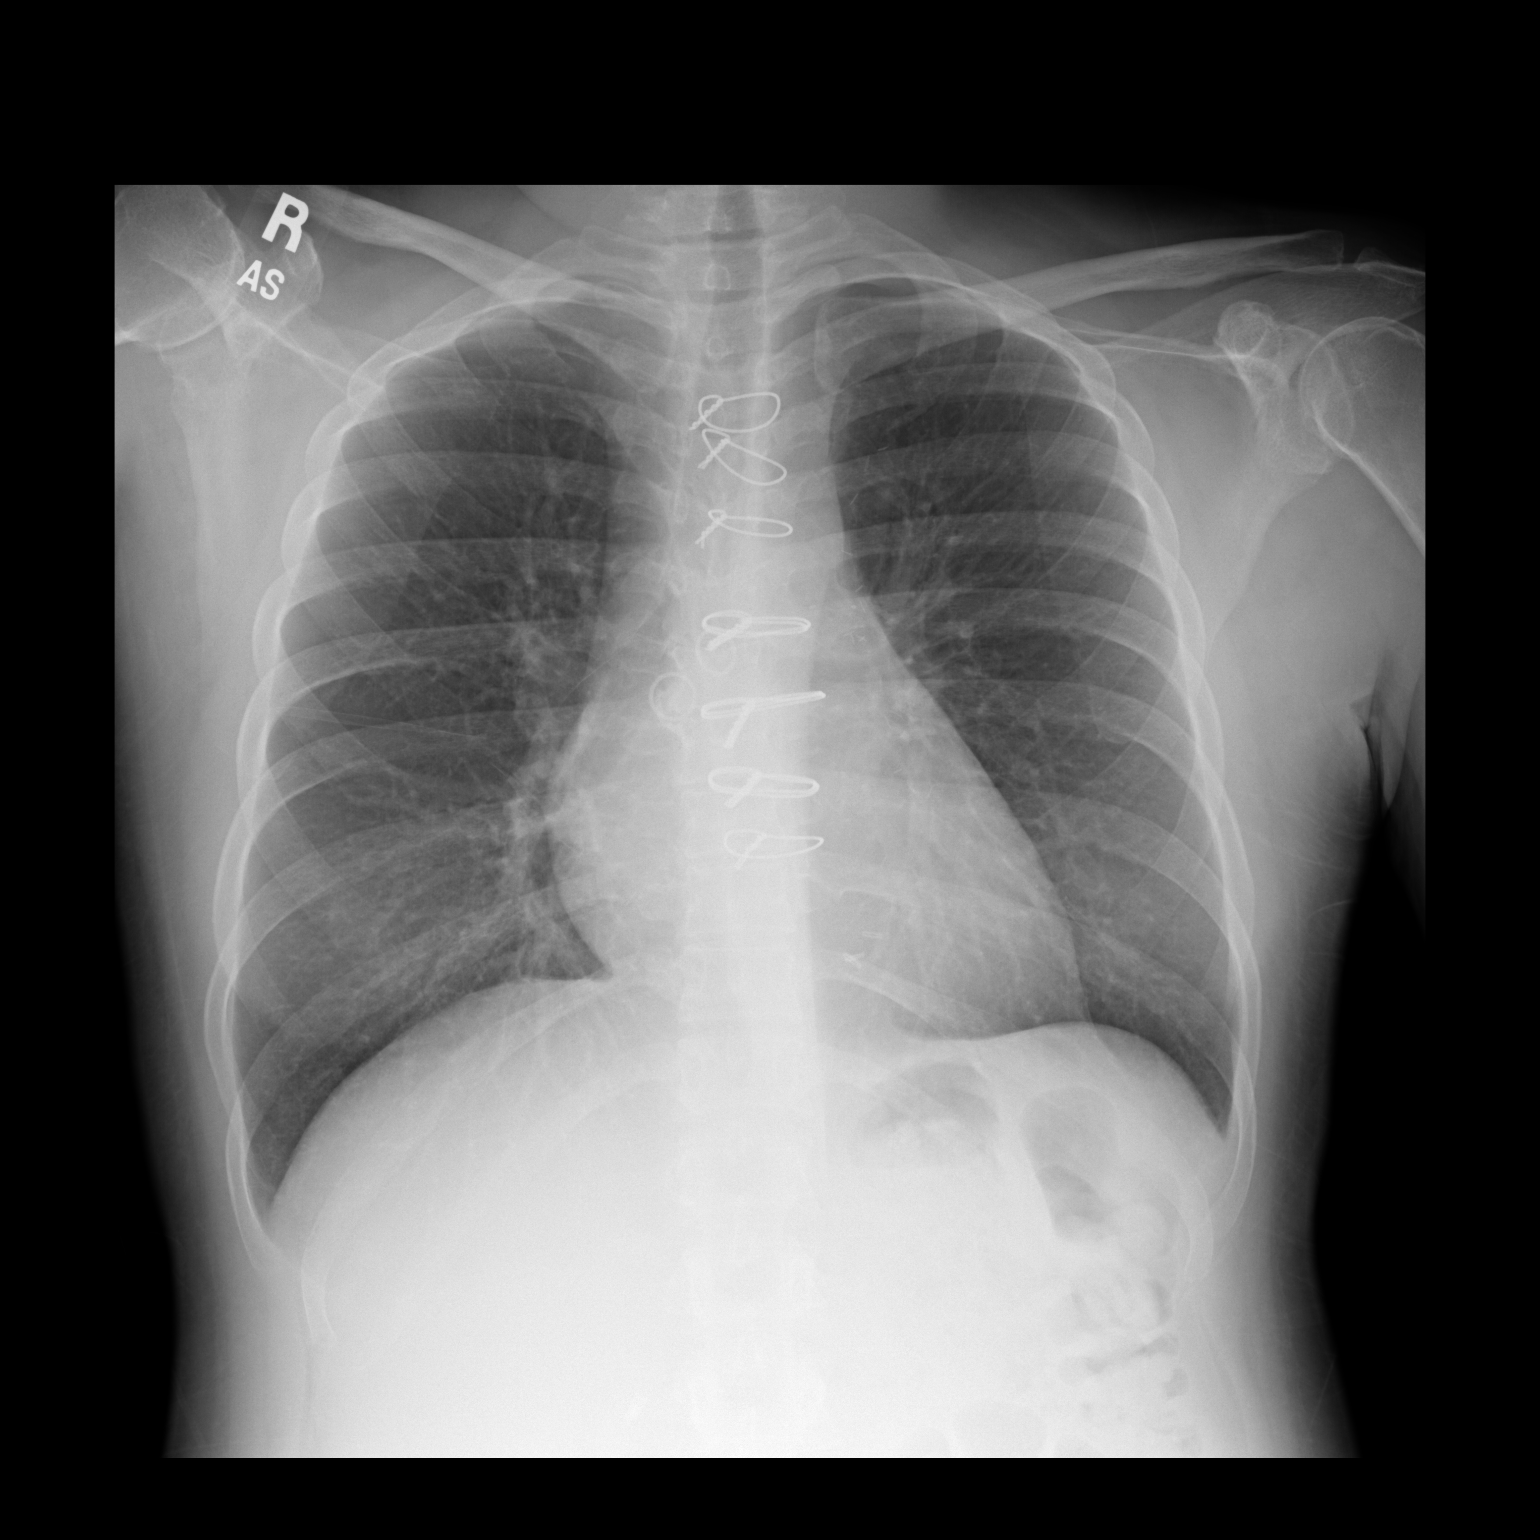

[2 of 2 positions shown; findings below may reference images not displayed]

FINDINGS: Previous median sternotomy and CABG. Heart size is normal.
Mediastinal shadows are otherwise normal. The lungs are clear. The
vascularity is normal. No pneumothorax or hemothorax. No significant
bone finding.
IMPRESSION: Previous CABG.  No active disease.

## 2018-12-05 ENCOUNTER — Ambulatory Visit (HOSPITAL_COMMUNITY): Payer: Self-pay

## 2018-12-05 IMAGING — DX DG CHEST 1V PORT
1 series · 1 of 1 positions shown · non-contrast
Comparison: Chest radiograph 02/17/2018

CLINICAL DATA: Vomiting and difficulty speaking

EXAM:
PORTABLE CHEST 1 VIEW

[chest]
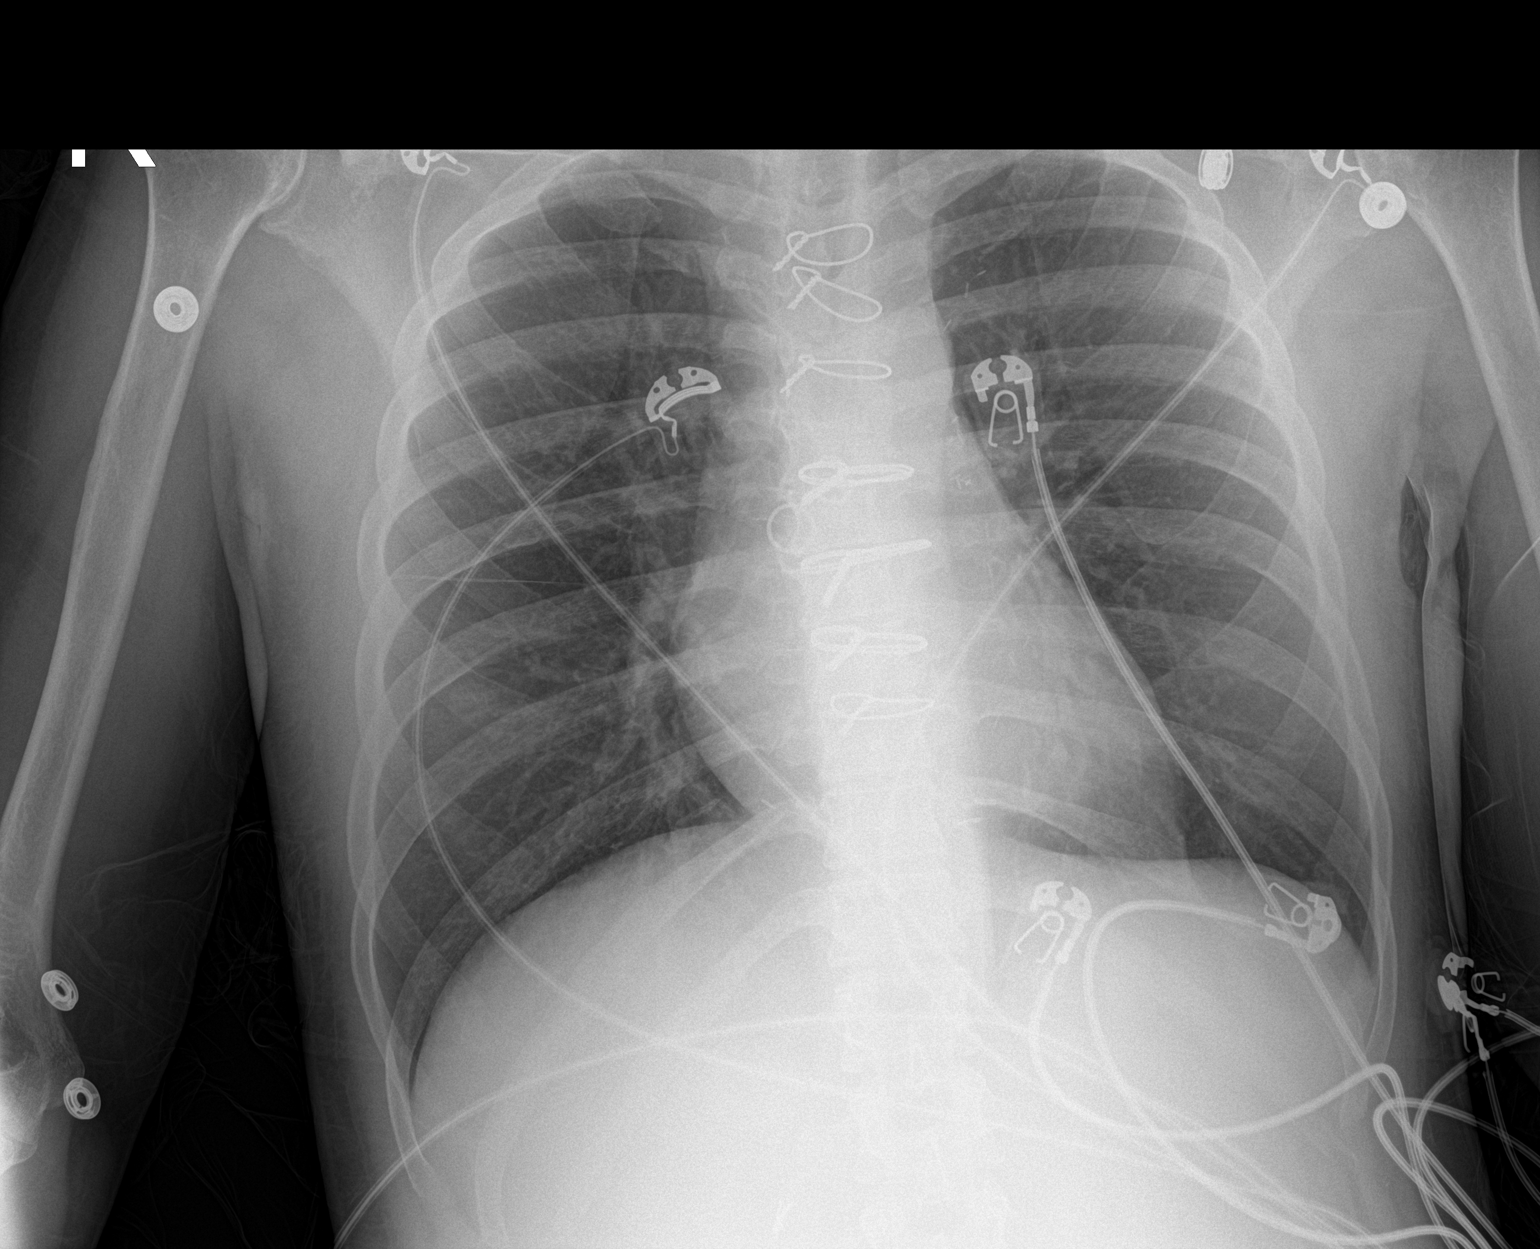

[1 of 1 positions shown; findings below may reference images not displayed]

FINDINGS: The heart size and mediastinal contours are within normal limits.
Both lungs are clear. The visualized skeletal structures are
unremarkable. CABG sequelae.
IMPRESSION: Clear lungs.

## 2018-12-06 IMAGING — CT CT HEAD W/O CM
3 series · 16 of 47 positions shown, 19 images · non-contrast
Comparison: None.

CLINICAL DATA: 51-year-old female with altered mental status.

EXAM:
CT HEAD WITHOUT CONTRAST
TECHNIQUE: Contiguous axial images were obtained from the base of the skull
through the vertex without intravenous contrast.

[Series 3: head 5.0 h30s · axial · 0.45mm/px · z∈[-159,-24]mm · 10 of 33 slices shown, 13 images]
[im 3/33  brain]
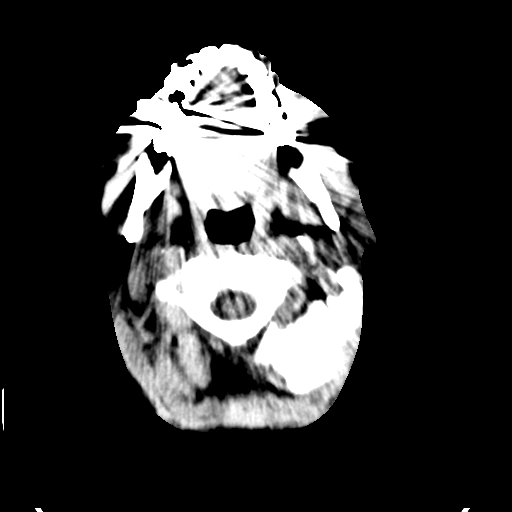
[im 3/33  bone]
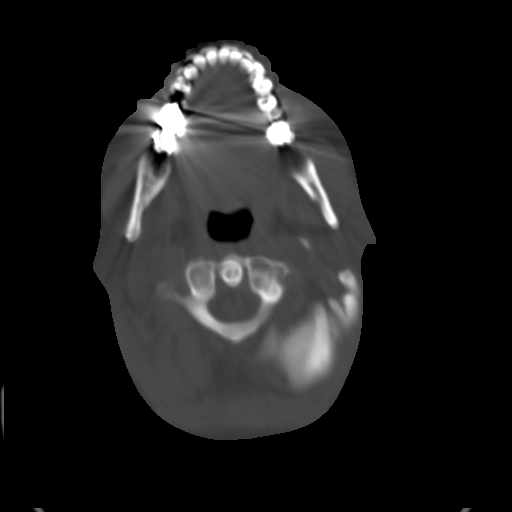
[im 6/33  brain]
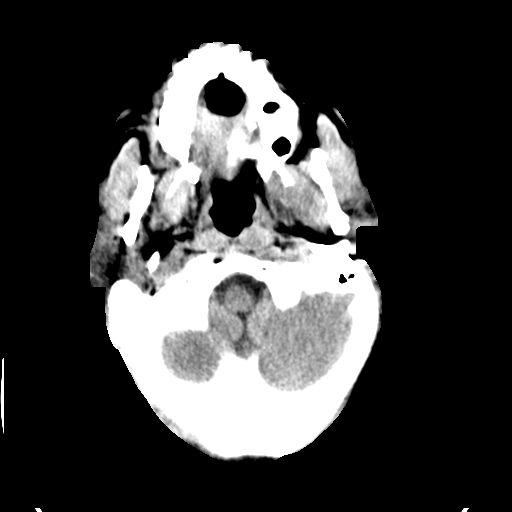
[im 9/33  brain]
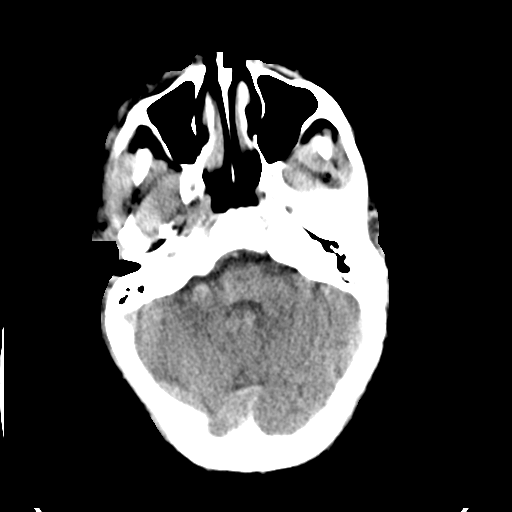
[im 12/33  brain]
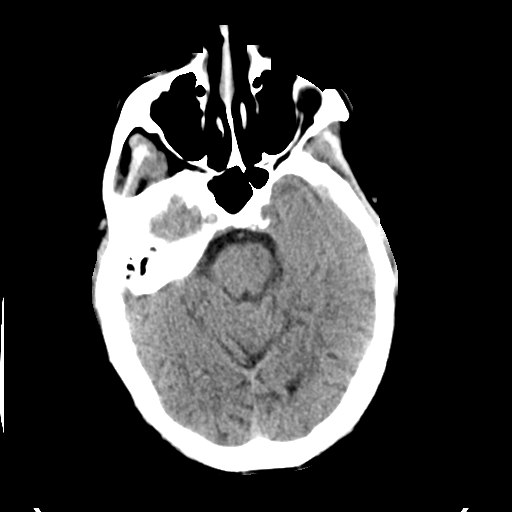
[im 15/33  brain]
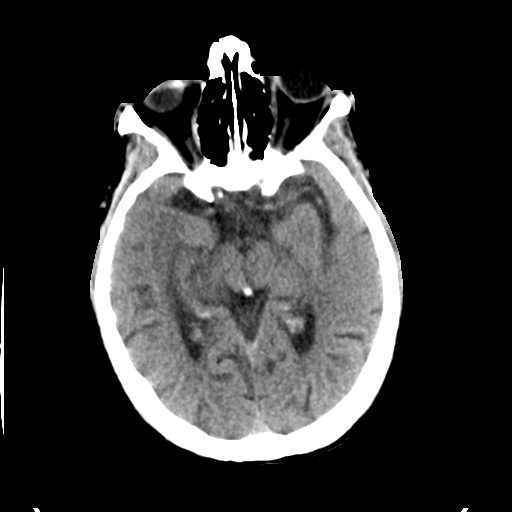
[im 15/33  bone]
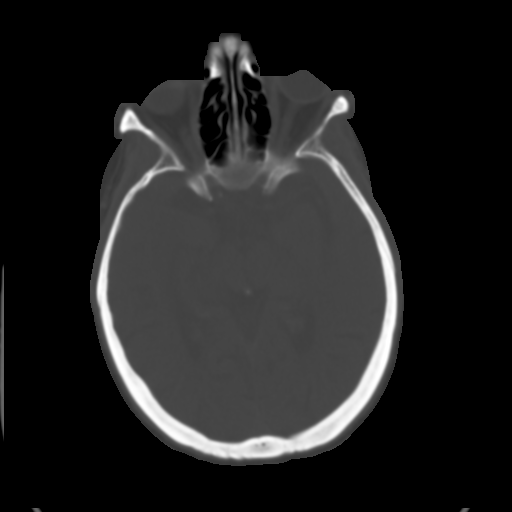
[im 18/33  brain]
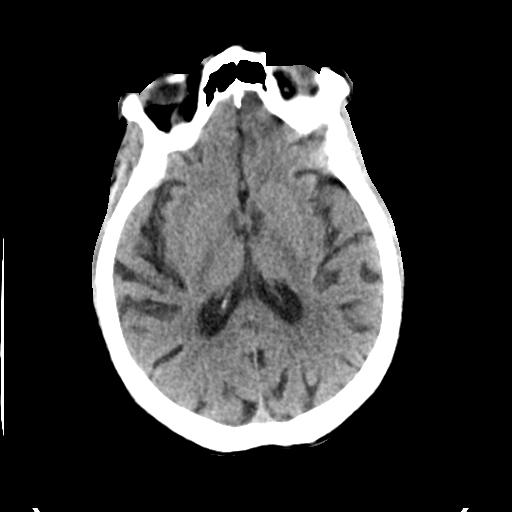
[im 21/33  brain]
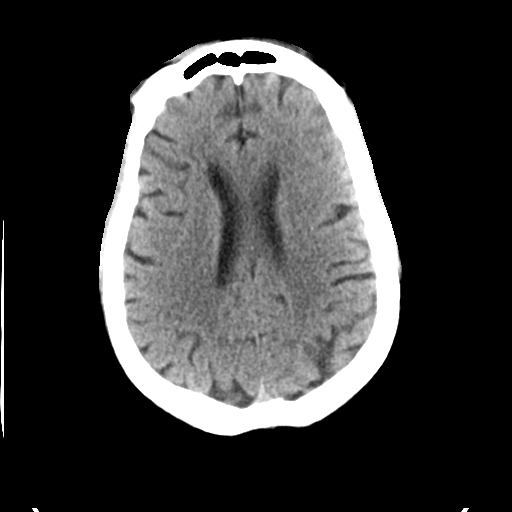
[im 25/33  brain]
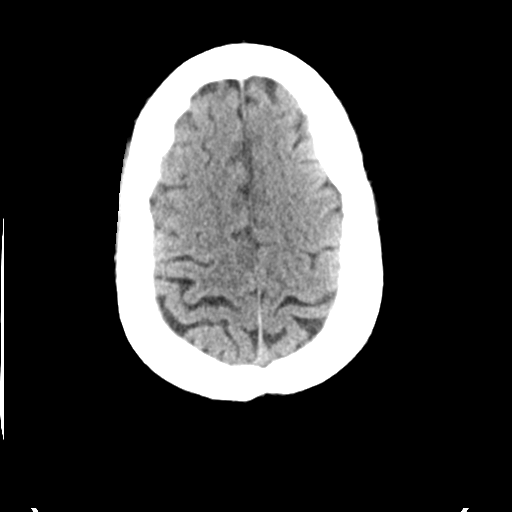
[im 27/33  brain]
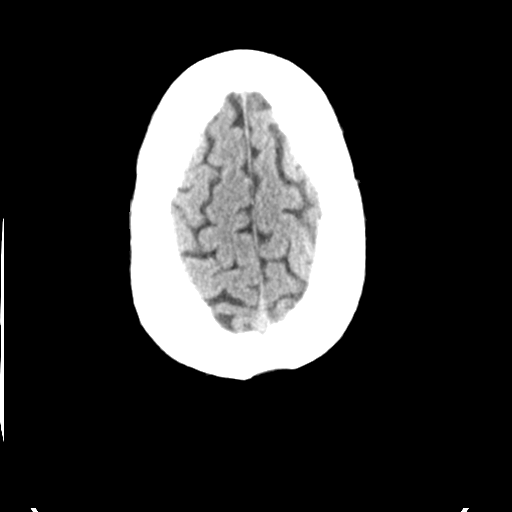
[im 27/33  bone]
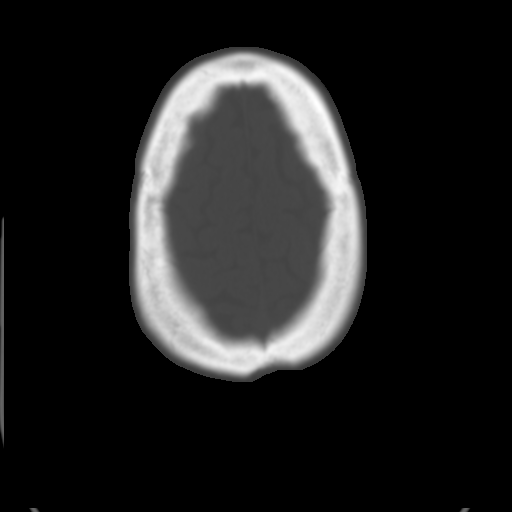
[im 30/33  brain]
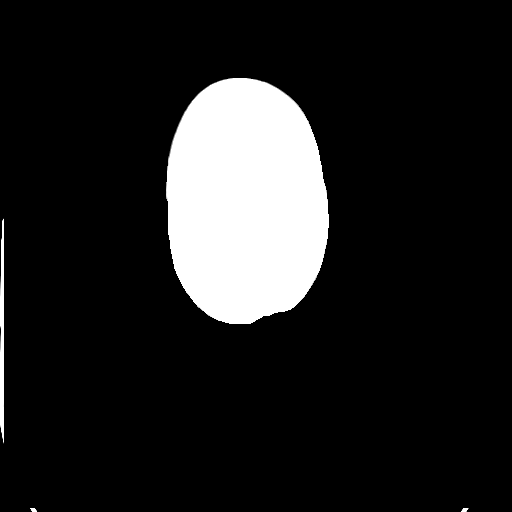

[Series 5: head 3.0 mpr cor · coronal · 0.32mm/px · 3 of 70 slices shown]
[im 24/70  brain]
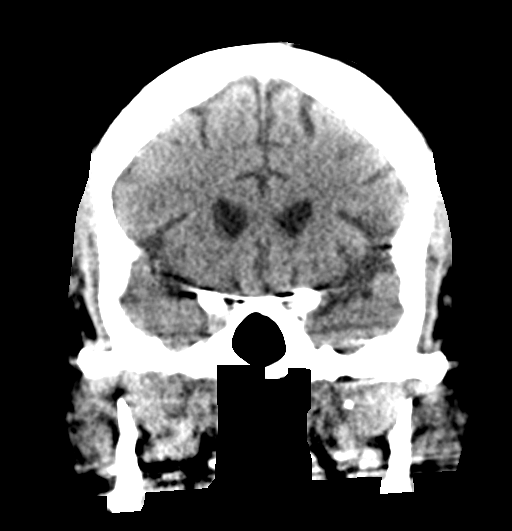
[im 31/70  brain]
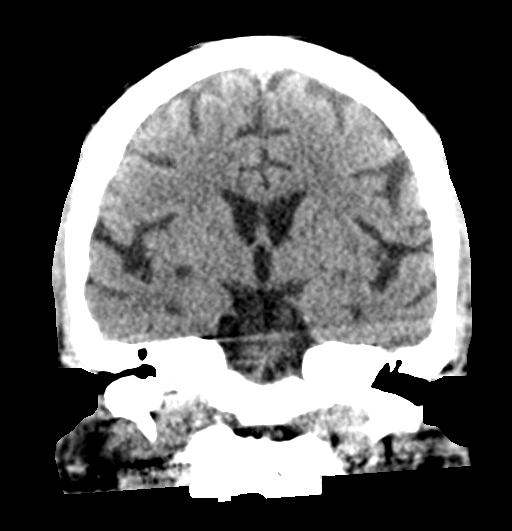
[im 39/70  brain]
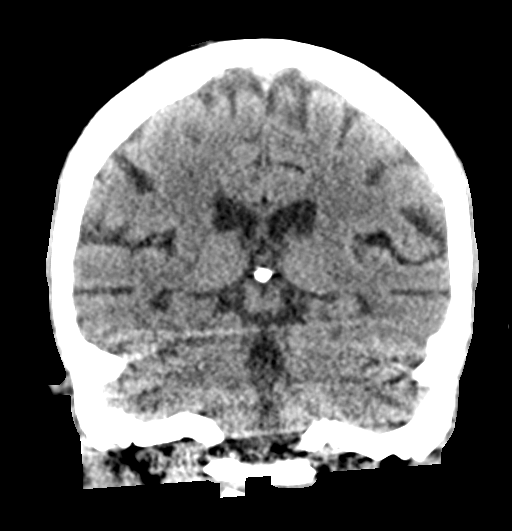

[Series 6: head 3.0 mpr sag · sagittal · 0.34mm/px · 3 of 58 slices shown]
[im 20/58  brain]
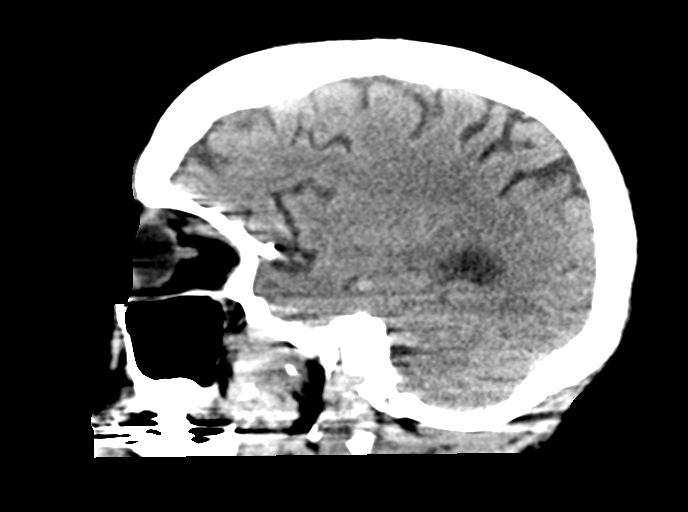
[im 29/58  brain]
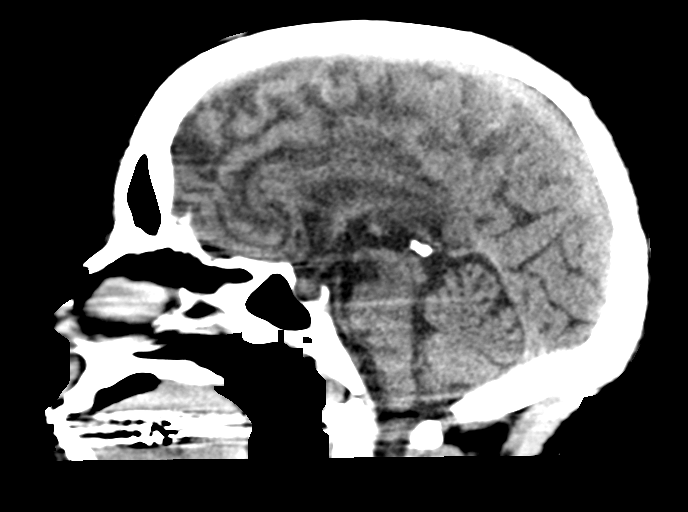
[im 39/58  brain]
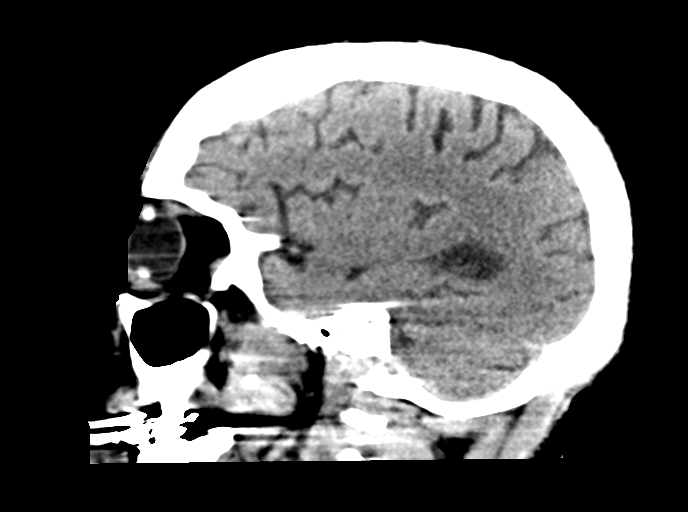

[16 of 47 positions shown; findings below may reference images not displayed]

FINDINGS: Brain: The ventricles and sulci appropriate size for patient's age.
The gray-white matter discrimination is preserved. No acute
intracranial hemorrhage. No mass effect or midline shift. No
extra-axial fluid collection.

Vascular: No hyperdense vessel or unexpected calcification.

Skull: Normal. Negative for fracture or focal lesion.

Sinuses/Orbits: No acute finding.  Right scleral banding.

Other: None
IMPRESSION: No acute intracranial pathology.

## 2018-12-08 ENCOUNTER — Ambulatory Visit (HOSPITAL_COMMUNITY): Payer: Self-pay

## 2018-12-08 ENCOUNTER — Telehealth (HOSPITAL_COMMUNITY): Payer: Self-pay | Admitting: *Deleted

## 2018-12-08 ENCOUNTER — Ambulatory Visit: Payer: Medicare HMO | Admitting: Endocrinology

## 2018-12-08 NOTE — Telephone Encounter (Signed)
No response from messge left last week.  Please contact letter sent to pt.  Pt has until 12/22/18 to make contact with Cardiac Rehab.  If no response, will close this referral. Maurice Small RN, BSN Cardiac and Pulmonary Rehab Nurse Navigator

## 2018-12-10 ENCOUNTER — Telehealth (HOSPITAL_COMMUNITY): Payer: Self-pay

## 2018-12-10 ENCOUNTER — Ambulatory Visit (HOSPITAL_COMMUNITY): Payer: Self-pay

## 2018-12-10 NOTE — Telephone Encounter (Signed)
Confirm Consent - In the setting of the current Covid19 crisis, you are scheduled for a phone visit with your Cardiac or Pulmonary team member.  Just as we do with many in-gym visits, in order for you to participate in this visit, we must obtain consent.  If you'd like, I can send this to your mychart (if signed up) or email for you to review.  Otherwise, I can obtain your verbal consent now.  By agreeing to a telephone visit, we'd like you to understand that the technology does not allow for your Cardiac or Pulmonary Rehab team member to perform a physical assessment, and thus may limit their ability to fully assess your ability to perform exercise programs. If your provider identifies any concerns that need to be evaluated in person, we will make arrangements to do so.  Finally, though the technology is pretty good, we cannot assure that it will always work on either your or our end and we cannot ensure that we have a secure connection.  Cardiac and Pulmonary Rehab Telehealth visits and At Home cardiac and pulmonary rehab are provided at no cost to you.        Are you willing to proceed?"        STAFF: Did the patient verbally acknowledge consent to telehealth visit? Document YES/NO here: Yes     Jessica C.  Cardiac and Pulmonary Rehab Staff        Date 12/10/2018    @ Time 4:20PM

## 2018-12-11 ENCOUNTER — Ambulatory Visit: Payer: Medicare HMO | Admitting: Endocrinology

## 2018-12-12 ENCOUNTER — Ambulatory Visit (HOSPITAL_COMMUNITY): Payer: Self-pay

## 2018-12-15 ENCOUNTER — Inpatient Hospital Stay (HOSPITAL_COMMUNITY)
Admission: EM | Admit: 2018-12-15 | Discharge: 2018-12-17 | DRG: 638 | Disposition: A | Discharge status: Home / Self Care | Payer: Medicare HMO | Attending: Internal Medicine | Admitting: Internal Medicine

## 2018-12-15 ENCOUNTER — Inpatient Hospital Stay: Payer: Self-pay

## 2018-12-15 ENCOUNTER — Other Ambulatory Visit: Payer: Self-pay

## 2018-12-15 ENCOUNTER — Encounter (HOSPITAL_COMMUNITY): Payer: Self-pay

## 2018-12-15 ENCOUNTER — Ambulatory Visit (HOSPITAL_COMMUNITY): Payer: Self-pay

## 2018-12-15 ENCOUNTER — Inpatient Hospital Stay (HOSPITAL_COMMUNITY): Payer: Medicare HMO

## 2018-12-15 ENCOUNTER — Encounter (HOSPITAL_COMMUNITY): Payer: Self-pay | Admitting: Emergency Medicine

## 2018-12-15 DIAGNOSIS — E1043 Type 1 diabetes mellitus with diabetic autonomic (poly)neuropathy: Secondary | ICD-10-CM | POA: Diagnosis present

## 2018-12-15 DIAGNOSIS — D509 Iron deficiency anemia, unspecified: Secondary | ICD-10-CM | POA: Diagnosis present

## 2018-12-15 DIAGNOSIS — I2510 Atherosclerotic heart disease of native coronary artery without angina pectoris: Secondary | ICD-10-CM | POA: Diagnosis present

## 2018-12-15 DIAGNOSIS — G2401 Drug induced subacute dyskinesia: Secondary | ICD-10-CM | POA: Diagnosis present

## 2018-12-15 DIAGNOSIS — F329 Major depressive disorder, single episode, unspecified: Secondary | ICD-10-CM | POA: Diagnosis present

## 2018-12-15 DIAGNOSIS — I10 Essential (primary) hypertension: Secondary | ICD-10-CM | POA: Diagnosis present

## 2018-12-15 DIAGNOSIS — E1110 Type 2 diabetes mellitus with ketoacidosis without coma: Secondary | ICD-10-CM | POA: Diagnosis present

## 2018-12-15 DIAGNOSIS — K219 Gastro-esophageal reflux disease without esophagitis: Secondary | ICD-10-CM | POA: Diagnosis present

## 2018-12-15 DIAGNOSIS — F419 Anxiety disorder, unspecified: Secondary | ICD-10-CM | POA: Diagnosis present

## 2018-12-15 DIAGNOSIS — R197 Diarrhea, unspecified: Secondary | ICD-10-CM | POA: Diagnosis not present

## 2018-12-15 DIAGNOSIS — R110 Nausea: Secondary | ICD-10-CM | POA: Diagnosis not present

## 2018-12-15 DIAGNOSIS — E785 Hyperlipidemia, unspecified: Secondary | ICD-10-CM | POA: Diagnosis present

## 2018-12-15 DIAGNOSIS — Z833 Family history of diabetes mellitus: Secondary | ICD-10-CM

## 2018-12-15 DIAGNOSIS — F32A Depression, unspecified: Secondary | ICD-10-CM | POA: Diagnosis present

## 2018-12-15 DIAGNOSIS — K5900 Constipation, unspecified: Secondary | ICD-10-CM | POA: Diagnosis present

## 2018-12-15 DIAGNOSIS — I252 Old myocardial infarction: Secondary | ICD-10-CM | POA: Diagnosis not present

## 2018-12-15 DIAGNOSIS — E0810 Diabetes mellitus due to underlying condition with ketoacidosis without coma: Secondary | ICD-10-CM | POA: Diagnosis not present

## 2018-12-15 DIAGNOSIS — Z20828 Contact with and (suspected) exposure to other viral communicable diseases: Secondary | ICD-10-CM | POA: Diagnosis present

## 2018-12-15 DIAGNOSIS — K3184 Gastroparesis: Secondary | ICD-10-CM | POA: Diagnosis present

## 2018-12-15 DIAGNOSIS — Z79899 Other long term (current) drug therapy: Secondary | ICD-10-CM

## 2018-12-15 DIAGNOSIS — E1143 Type 2 diabetes mellitus with diabetic autonomic (poly)neuropathy: Secondary | ICD-10-CM | POA: Diagnosis not present

## 2018-12-15 DIAGNOSIS — E1040 Type 1 diabetes mellitus with diabetic neuropathy, unspecified: Secondary | ICD-10-CM | POA: Diagnosis present

## 2018-12-15 DIAGNOSIS — E1010 Type 1 diabetes mellitus with ketoacidosis without coma: Secondary | ICD-10-CM | POA: Diagnosis not present

## 2018-12-15 DIAGNOSIS — E1069 Type 1 diabetes mellitus with other specified complication: Secondary | ICD-10-CM | POA: Diagnosis present

## 2018-12-15 DIAGNOSIS — Z794 Long term (current) use of insulin: Secondary | ICD-10-CM

## 2018-12-15 DIAGNOSIS — Z452 Encounter for adjustment and management of vascular access device: Secondary | ICD-10-CM

## 2018-12-15 DIAGNOSIS — Z951 Presence of aortocoronary bypass graft: Secondary | ICD-10-CM

## 2018-12-15 DIAGNOSIS — N179 Acute kidney failure, unspecified: Secondary | ICD-10-CM | POA: Diagnosis present

## 2018-12-15 DIAGNOSIS — R140 Abdominal distension (gaseous): Secondary | ICD-10-CM

## 2018-12-15 DIAGNOSIS — T450X5A Adverse effect of antiallergic and antiemetic drugs, initial encounter: Secondary | ICD-10-CM | POA: Diagnosis present

## 2018-12-15 LAB — GLUCOSE, CAPILLARY
Glucose-Capillary: 261 mg/dL — ABNORMAL HIGH (ref 70–99)
Glucose-Capillary: 232 mg/dL — ABNORMAL HIGH (ref 70–99)
Glucose-Capillary: 168 mg/dL — ABNORMAL HIGH (ref 70–99)
Glucose-Capillary: 143 mg/dL — ABNORMAL HIGH (ref 70–99)
Glucose-Capillary: 132 mg/dL — ABNORMAL HIGH (ref 70–99)

## 2018-12-15 LAB — HEMOGLOBIN A1C
Hgb A1c MFr Bld: 9.8 % — ABNORMAL HIGH (ref 4.8–5.6)
Mean Plasma Glucose: 234.56 mg/dL

## 2018-12-15 LAB — BASIC METABOLIC PANEL
Anion gap: 19 — ABNORMAL HIGH (ref 5–15)
Anion gap: 14 (ref 5–15)
BUN: 29 mg/dL — ABNORMAL HIGH (ref 6–20)
BUN: 30 mg/dL — ABNORMAL HIGH (ref 6–20)
CO2: 16 mmol/L — ABNORMAL LOW (ref 22–32)
CO2: 18 mmol/L — ABNORMAL LOW (ref 22–32)
Calcium: 10 mg/dL (ref 8.9–10.3)
Calcium: 9.3 mg/dL (ref 8.9–10.3)
Chloride: 108 mmol/L (ref 98–111)
Chloride: 113 mmol/L — ABNORMAL HIGH (ref 98–111)
Creatinine, Ser: 1.71 mg/dL — ABNORMAL HIGH (ref 0.44–1.00)
Creatinine, Ser: 1.64 mg/dL — ABNORMAL HIGH (ref 0.44–1.00)
GFR calc Af Amer: 39 mL/min — ABNORMAL LOW (ref >60)
GFR calc Af Amer: 42 mL/min — ABNORMAL LOW (ref >60)
GFR calc non Af Amer: 34 mL/min — ABNORMAL LOW (ref >60)
GFR calc non Af Amer: 36 mL/min — ABNORMAL LOW (ref >60)
Glucose, Bld: 258 mg/dL — ABNORMAL HIGH (ref 70–99)
Glucose, Bld: 147 mg/dL — ABNORMAL HIGH (ref 70–99)
Potassium: 3.9 mmol/L (ref 3.5–5.1)
Potassium: 3.6 mmol/L (ref 3.5–5.1)
Sodium: 143 mmol/L (ref 135–145)
Sodium: 145 mmol/L (ref 135–145)

## 2018-12-15 LAB — COMPREHENSIVE METABOLIC PANEL
ALT: 16 U/L (ref 0–44)
AST: 18 U/L (ref 15–41)
Albumin: 3.9 g/dL (ref 3.5–5.0)
Alkaline Phosphatase: 77 U/L (ref 38–126)
Anion gap: 24 — ABNORMAL HIGH (ref 5–15)
BUN: 24 mg/dL — ABNORMAL HIGH (ref 6–20)
CO2: 12 mmol/L — ABNORMAL LOW (ref 22–32)
Calcium: 9.4 mg/dL (ref 8.9–10.3)
Chloride: 103 mmol/L (ref 98–111)
Creatinine, Ser: 1.41 mg/dL — ABNORMAL HIGH (ref 0.44–1.00)
GFR calc Af Amer: 50 mL/min — ABNORMAL LOW (ref >60)
GFR calc non Af Amer: 43 mL/min — ABNORMAL LOW (ref >60)
Glucose, Bld: 501 mg/dL (ref 70–99)
Potassium: 4.5 mmol/L (ref 3.5–5.1)
Sodium: 139 mmol/L (ref 135–145)
Total Bilirubin: 1.3 mg/dL — ABNORMAL HIGH (ref 0.3–1.2)
Total Protein: 7.5 g/dL (ref 6.5–8.1)

## 2018-12-15 LAB — CBC
HCT: 36.4 % (ref 36.0–46.0)
Hemoglobin: 11.2 g/dL — ABNORMAL LOW (ref 12.0–15.0)
MCH: 28.5 pg (ref 26.0–34.0)
MCHC: 30.8 g/dL (ref 30.0–36.0)
MCV: 92.6 fL (ref 80.0–100.0)
Platelets: 203 10*3/uL (ref 150–400)
RBC: 3.93 MIL/uL (ref 3.87–5.11)
RDW: 12.8 % (ref 11.5–15.5)
WBC: 14 10*3/uL — ABNORMAL HIGH (ref 4.0–10.5)
nRBC: 0 % (ref 0.0–0.2)

## 2018-12-15 LAB — LIPASE, BLOOD: Lipase: 25 U/L (ref 11–51)

## 2018-12-15 LAB — SARS CORONAVIRUS 2 BY RT PCR (HOSPITAL ORDER, PERFORMED IN ~~LOC~~ HOSPITAL LAB): SARS Coronavirus 2: NEGATIVE

## 2018-12-15 LAB — TROPONIN I: Troponin I: <0.03 ng/mL

## 2018-12-15 LAB — MRSA PCR SCREENING: MRSA by PCR: NEGATIVE

## 2018-12-15 LAB — I-STAT BETA HCG BLOOD, ED (MC, WL, AP ONLY): I-stat hCG, quantitative: <5 m[IU]/mL (ref <5)

## 2018-12-15 LAB — CBG MONITORING, ED
Glucose-Capillary: 469 mg/dL — ABNORMAL HIGH (ref 70–99)
Glucose-Capillary: 396 mg/dL — ABNORMAL HIGH (ref 70–99)
Glucose-Capillary: 342 mg/dL — ABNORMAL HIGH (ref 70–99)

## 2018-12-15 MED ORDER — CHLORHEXIDINE GLUCONATE CLOTH 2 % EX PADS
6.0000 | MEDICATED_PAD | Freq: Every day | CUTANEOUS | Status: DC
  Administered 2018-12-16 – 2018-12-17 (×2): 6 via TOPICAL

## 2018-12-15 MED ORDER — SODIUM CHLORIDE 0.9 % IV SOLN
INTRAVENOUS | Status: DC
  Administered 2018-12-15: 17:00:00 via INTRAVENOUS

## 2018-12-15 MED ORDER — SODIUM CHLORIDE 0.9 % IV BOLUS: 30.0000 mL/kg | Freq: Once | INTRAVENOUS | Status: DC

## 2018-12-15 MED ORDER — BUSPIRONE HCL 10 MG PO TABS
10.0000 mg | ORAL_TABLET | Freq: Three times a day (TID) | ORAL | Status: DC
  Administered 2018-12-15 – 2018-12-17 (×6): 10 mg via ORAL
  Filled 2018-12-15 (×2): qty 1
  Filled 2018-12-15: qty 2
  Filled 2018-12-15 (×3): qty 1

## 2018-12-15 MED ORDER — HEPARIN SODIUM (PORCINE) 5000 UNIT/ML IJ SOLN
5000.0000 [IU] | Freq: Three times a day (TID) | INTRAMUSCULAR | Status: DC
  Administered 2018-12-15 – 2018-12-17 (×5): 5000 [IU] via SUBCUTANEOUS
  Filled 2018-12-15 (×5): qty 1

## 2018-12-15 MED ORDER — INSULIN REGULAR(HUMAN) IN NACL 100-0.9 UT/100ML-% IV SOLN
INTRAVENOUS | Status: DC
  Administered 2018-12-15: 4.4 [IU]/h via INTRAVENOUS
  Filled 2018-12-15 (×2): qty 100

## 2018-12-15 MED ORDER — MORPHINE SULFATE (PF) 2 MG/ML IV SOLN
2.0000 mg | INTRAVENOUS | Status: DC | PRN
  Administered 2018-12-15: 2 mg via INTRAVENOUS
  Filled 2018-12-15: qty 1

## 2018-12-15 MED ORDER — METOPROLOL TARTRATE 25 MG PO TABS
12.5000 mg | ORAL_TABLET | Freq: Two times a day (BID) | ORAL | Status: DC
  Administered 2018-12-15 – 2018-12-16 (×2): 12.5 mg via ORAL
  Filled 2018-12-15 (×3): qty 1

## 2018-12-15 MED ORDER — FENTANYL CITRATE (PF) 100 MCG/2ML IJ SOLN
50.0000 ug | Freq: Once | INTRAMUSCULAR | Status: AC
  Administered 2018-12-15: 50 ug via INTRAMUSCULAR

## 2018-12-15 MED ORDER — DIPHENHYDRAMINE HCL 50 MG/ML IJ SOLN
25.0000 mg | Freq: Once | INTRAMUSCULAR | Status: AC
  Administered 2018-12-15: 11:00:00 25 mg via INTRAVENOUS
  Filled 2018-12-15: qty 1

## 2018-12-15 MED ORDER — METOCLOPRAMIDE HCL 5 MG/ML IJ SOLN
10.0000 mg | Freq: Once | INTRAMUSCULAR | Status: AC
  Administered 2018-12-15: 10 mg via INTRAVENOUS
  Filled 2018-12-15: qty 2

## 2018-12-15 MED ORDER — DEXTROSE-NACL 5-0.45 % IV SOLN
INTRAVENOUS | Status: DC
  Administered 2018-12-15: 19:00:00 via INTRAVENOUS

## 2018-12-15 MED ORDER — PYRIDOSTIGMINE BROMIDE 60 MG PO TABS
60.0000 mg | ORAL_TABLET | Freq: Three times a day (TID) | ORAL | Status: DC
  Administered 2018-12-15 – 2018-12-17 (×6): 60 mg via ORAL
  Filled 2018-12-15 (×7): qty 1

## 2018-12-15 MED ORDER — ASPIRIN EC 81 MG PO TBEC
81.0000 mg | DELAYED_RELEASE_TABLET | Freq: Every day | ORAL | Status: DC
  Administered 2018-12-15 – 2018-12-17 (×3): 81 mg via ORAL
  Filled 2018-12-15 (×3): qty 1

## 2018-12-15 MED ORDER — FENTANYL CITRATE (PF) 100 MCG/2ML IJ SOLN
50.0000 ug | Freq: Once | INTRAMUSCULAR | Status: DC
  Filled 2018-12-15: qty 2

## 2018-12-15 MED ORDER — PANTOPRAZOLE SODIUM 40 MG PO TBEC
40.0000 mg | DELAYED_RELEASE_TABLET | Freq: Every day | ORAL | Status: DC
  Administered 2018-12-15 – 2018-12-17 (×3): 40 mg via ORAL
  Filled 2018-12-15 (×3): qty 1

## 2018-12-15 MED ORDER — FENTANYL CITRATE (PF) 100 MCG/2ML IJ SOLN: 50.0000 ug | Freq: Once | INTRAMUSCULAR | Status: DC

## 2018-12-15 MED ORDER — PREGABALIN 50 MG PO CAPS
150.0000 mg | ORAL_CAPSULE | Freq: Two times a day (BID) | ORAL | Status: DC
  Administered 2018-12-15 – 2018-12-17 (×4): 150 mg via ORAL
  Filled 2018-12-15 (×4): qty 3

## 2018-12-15 MED ORDER — LORAZEPAM 2 MG/ML IJ SOLN
2.0000 mg | Freq: Once | INTRAMUSCULAR | Status: AC
  Administered 2018-12-15: 2 mg via INTRAMUSCULAR

## 2018-12-15 MED ORDER — ONDANSETRON HCL 4 MG/2ML IJ SOLN
4.0000 mg | Freq: Four times a day (QID) | INTRAMUSCULAR | Status: DC | PRN
  Administered 2018-12-15: 17:00:00 4 mg via INTRAVENOUS
  Filled 2018-12-15: qty 2

## 2018-12-15 MED ORDER — ORAL CARE MOUTH RINSE
15.0000 mL | Freq: Two times a day (BID) | OROMUCOSAL | Status: DC
  Administered 2018-12-15 – 2018-12-16 (×3): 15 mL via OROMUCOSAL

## 2018-12-15 MED ORDER — DOCUSATE SODIUM 100 MG PO CAPS: 100.0000 mg | ORAL_CAPSULE | Freq: Every day | ORAL | Status: DC | PRN

## 2018-12-15 NOTE — Progress Notes (Signed)
[facility] Progress Note Patient Name: [name] DOB: [birth year] MRN: [id number]   Date of Service  12/15/2018  HPI/Events of Note  Notified by hospitalist that patient is in need of IV access.  IV team evaluated and could not get IV access.  Pt has needed IR to place PICCs in the past.   PT is admitted for DKA.  She has no IV access at the moment.  eICU Interventions  Ground team made aware and will insert a central line.      Intervention Category Intermediate Interventions: Other:  Elsie Lincoln 12/15/2018, 9:13 PM

## 2018-12-15 NOTE — Plan of Care (Signed)
  Problem: Education: Goal: Ability to describe self-care measures that may prevent or decrease complications (Diabetes Survival Skills Education) will improve Outcome: Progressing   Problem: Cardiac: Goal: Ability to maintain an adequate cardiac output will improve Outcome: Progressing   Problem: Health Behavior/Discharge Planning: Goal: Ability to manage health-related needs will improve Outcome: Progressing   Problem: Fluid Volume: Goal: Ability to achieve a balanced intake and output will improve Outcome: Progressing   Problem: Metabolic: Goal: Ability to maintain appropriate glucose levels will improve Outcome: Progressing   Problem: Nutritional: Goal: Maintenance of adequate nutrition will improve Outcome: Progressing   Problem: Respiratory: Goal: Will regain and/or maintain adequate ventilation Outcome: Progressing

## 2018-12-15 NOTE — Progress Notes (Addendum)
Order for IV team consult to start IV. Ultrasound used to scan both arms, upper and lower aspects. PIV prior to LUE infiltrated. LUE swollen. Nothing viewed in the LLE that could safely hold a catheter. Not best practice to use that arm with an infiltrate upstream. No viable vessels viewed in RLE and RUE. Previous attempts noted to cephalic vein region. Patient unable to fully raise and adduct R arm. Visible scarring to R of neck, indicating central lines in the past. Information passed to nurse. Upon further review, the patient has a FYI indicating that PICCs placed by IR.

## 2018-12-15 NOTE — ED Notes (Signed)
Logan RN attempted IV start using ultrasound w/o success. Abigail PA-C at bedside attempting IV start using ultrasound.

## 2018-12-15 NOTE — ED Notes (Signed)
Reglan was administered IM per PA Abigail request (verbal order) due to unsuccessful IV attempts by staff.

## 2018-12-15 NOTE — ED Notes (Signed)
Date and time results received: 12/15/18 9:32 AM   Test: glucose Critical Value: 501  Name of Provider Notified: Thurnell Garbe MD  Orders Received? Or Actions Taken?: aware; orders already placed

## 2018-12-15 NOTE — ED Triage Notes (Signed)
Per EMS pt from home with hyperglycemia with CBG of 499; emesis x1 day; complaint of 9/10 abdominal pain. Pt non compliant with medications. Pt noted to have +4 pitting edema to both lower extremities.

## 2018-12-15 NOTE — ED Notes (Signed)
Bed: WA09 Expected date:  Expected time:  Means of arrival:  Comments: EMS 

## 2018-12-15 NOTE — ED Notes (Addendum)
Unable to establish IV access. This Probation officer will get other staff member to attempt. This Probation officer will put in IV team consult.

## 2018-12-15 NOTE — H&P (Addendum)
History and Physical    [name] OMV:[id number] DOB: [birth year] DOA: 12/15/2018  PCP: [name], MD   Patient coming from: Home    Chief Complaint: Nausea, vomiting, abdominal pain  HPI: [name] is a 53 y.o. female with medical history significant of diabetes mellitus type 1, anxiety, depression, coronary disease, gastroparesis, tardive dyskinesia, hyperlipidemia who presents from home today with complaints of persistent nausea, vomiting and abdominal pain.  All the symptoms started yesterday.  When she checked her blood sugars it was very high.  She was also peeing a lot and was feeling very thirsty.  She has history of chronic gastroparesis since last 6 years.  She follows with Dr. Dwyane Dee, endocrinology. Patient seen and examined the bedside in the emergency department.  Found to be tachycardic.  Was in mild to moderate distress due to persistent  nausea and vomiting and generalized weakness. She states she lives with his sister.  ED Course: Blood work done in the emergency department showed glucose of 501, anion gap of 4, creatinine of 1.41, mild leukocytosis.  CO2 of 12.  Review of Systems: As per HPI otherwise 10 point review of systems negative.    Past Medical History:  Diagnosis Date   AKI (acute kidney injury) ([place])    Anemia, iron deficiency    Anxiety and depression 05/18/2015   CAD (coronary artery disease), native coronary artery 01/11/2018   Non-STEMI with severe three-vessel disease noted July 2019   Depression    Diabetes mellitus without complication ([place])    Diabetic gastroparesis ([place])    Diabetic neuropathy, type I diabetes mellitus ([place]) 05/18/2015   DKA (diabetic ketoacidoses) ([place]) 06/16/2017   DKA, type 1 ([place]) 03/18/2016   Essential hypertension    Gastroparesis    GERD (gastroesophageal reflux disease)    HLD (hyperlipidemia)    MDD (major depressive disorder), single episode, severe , no psychosis ([place])    Non-ST  elevation (NSTEMI) myocardial infarction ([place])    S/P CABG x 3 01/14/2018   Tardive dyskinesia    Vitamin B12 deficiency 08/16/2015    Past Surgical History:  Procedure Laterality Date   CARDIAC SURGERY     COLONOSCOPY  09/27/2014   at [place] N/A 01/14/2018   Procedure: CORONARY ARTERY BYPASS GRAFTING (CABG)X3, RIGHT AND LEFT SAPHENOUS VEIN HARVEST, MAMMARY TAKE DOWN. MAMMARY TO LAD, SVG TO PD, SVG TO DISTAL CIRC.;  Surgeon: Grace Isaac, MD;  Location: [place];  Service: Open Heart Surgery;  Laterality: N/A;   ESOPHAGOGASTRODUODENOSCOPY  09/27/2014   at [facility], Dr Rolan Lipa. biospy neg for celiac, neg for H pylori.    EYE SURGERY     gailstones     IR FLUORO GUIDE CV LINE RIGHT  02/01/2017   IR FLUORO GUIDE CV LINE RIGHT  03/06/2017   IR FLUORO GUIDE CV LINE RIGHT  03/25/2017   IR GENERIC HISTORICAL  01/24/2016   IR FLUORO GUIDE CV LINE RIGHT 01/24/2016 Darrell K Allred, PA-C [facility]   IR GENERIC HISTORICAL  01/24/2016   IR US GUIDE VASC ACCESS RIGHT 01/24/2016 Darrell K Allred, PA-C [facility]   IR US GUIDE VASC ACCESS RIGHT  02/01/2017   IR US GUIDE VASC ACCESS RIGHT  03/06/2017   IR US GUIDE VASC ACCESS RIGHT  03/25/2017   LEFT HEART CATH AND CORONARY ANGIOGRAPHY N/A 01/07/2018   Procedure: LEFT HEART CATH AND CORONARY ANGIOGRAPHY;  Surgeon: Troy Sine, MD;  Location: [place] [facility];  Service: Cardiovascular;  Laterality: N/A;   POSTERIOR VITRECTOMY AND MEMBRANE PEEL-LEFT EYE  09/28/2002   POSTERIOR VITRECTOMY AND MEMBRANE PEEL-RIGHT EYE  03/16/2002   RETINAL DETACHMENT SURGERY     TEE WITHOUT CARDIOVERSION N/A 01/14/2018   Procedure: TRANSESOPHAGEAL ECHOCARDIOGRAM (TEE);  Surgeon: Grace Isaac, MD;  Location: [place];  Service: Open Heart Surgery;  Laterality: N/A;     reports that she has never smoked. She has never used smokeless tobacco. She reports current drug use. Drug: Marijuana. She reports that she does not  drink alcohol.  Allergies  Allergen Reactions   Anesthetics, Amide Nausea And Vomiting   Penicillins Diarrhea, Nausea And Vomiting and Other (See Comments)    Has patient had a PCN reaction causing immediate rash, facial/tongue/throat swelling, SOB or lightheadedness with hypotension: Yes Has patient had a PCN reaction causing severe rash involving mucus membranes or skin necrosis: No Has patient had a PCN reaction that required hospitalization No Has patient had a PCN reaction occurring within the last 10 years: Yes  If all of the above answers are "NO", then may proceed with Cephalosporin use.    Buprenorphine Hcl Rash   Encainide Nausea And Vomiting   Metoclopramide Other (See Comments)    Dystonia, muscle rigidity.  Patient states she is only allergic to the "pill form, not the IV"    Family History  Problem Relation Age of Onset   Cystic fibrosis Mother    Hypertension Father    Diabetes Brother    Hypertension Maternal Grandmother      Prior to Admission medications   Medication Sig Start Date End Date Taking? Authorizing Provider  ammonium lactate (AMLACTIN) 12 % cream Apply topically as needed for dry skin. 07/08/18  Yes Trula Slade, DPM  aspirin EC 81 MG tablet Take 1 tablet (81 mg total) by mouth daily. 06/03/18  Yes Nahser, Wonda Cheng, MD  Blood Glucose Monitoring Suppl (ONETOUCH VERIO) w/Device KIT Use as directed 03/21/18  Yes Newlin, [name], MD  busPIRone (BUSPAR) 10 MG tablet Take 1 tablet (10 mg total) by mouth 3 (three) times daily. 06/12/18  Yes [name], MD  diphenhydrAMINE (BENADRYL) 25 mg capsule Take 25 mg by mouth every 6 (six) hours as needed for allergies.   Yes [provider]  docusate sodium (COLACE) 100 MG capsule Take 1 capsule (100 mg total) by mouth daily as needed for mild constipation. 02/03/18  Yes Burtis Junes, NP  furosemide (LASIX) 40 MG tablet Take 1 tablet (40 mg total) by mouth daily as needed for fluid or  edema. 06/12/18 06/07/19 Yes [name], MD  glucose blood (ACCU-CHEK AVIVA PLUS) test strip Use as instructed to test blood sugar 5 times daily Dx E10.65 09/22/18  Yes Elayne Snare, MD  insulin aspart (NOVOLOG) 100 UNIT/ML injection Inject 0-12 Units into the skin 3 (three) times daily before meals. As per sliding scale 10/14/18  Yes Elayne Snare, MD  Insulin Glargine (LANTUS SOLOSTAR) 100 UNIT/ML Solostar Pen Inject 15 units under the skin once daily in the morning. 10/14/18  Yes Elayne Snare, MD  Insulin Syringe-Needle U-100 (BD INSULIN SYRINGE ULTRAFINE) 31G X 5/16" 0.5 ML MISC 1 each by Does not apply route 4 (four) times daily. 04/28/18  Yes Elayne Snare, MD  Lancets [facility] DELICA PLUS [id number]) [place] 1 Units by Does not apply route as directed. 03/21/18  Yes [name], MD  loperamide (IMODIUM) 2 MG capsule Take 1 capsule (2 mg total) by mouth as needed for diarrhea  or loose stools. 04/15/18  Yes Oretha Milch D, MD  losartan (COZAAR) 25 MG tablet Take 1 tablet (25 mg total) by mouth daily. 10/08/18  Yes Nahser, Wonda Cheng, MD  metoprolol tartrate (LOPRESSOR) 25 MG tablet Take 0.5 tablets (12.5 mg total) by mouth 2 (two) times daily. 10/08/18  Yes Nahser, Wonda Cheng, MD  pantoprazole (PROTONIX) 40 MG tablet Take 1 tablet (40 mg total) by mouth daily. 06/12/18  Yes [name], MD  potassium chloride (K-DUR) 10 MEQ tablet Take 1 tablet (10 mEq total) by mouth daily. 06/12/18  Yes [name], MD  pregabalin (LYRICA) 150 MG capsule Take 1 capsule (150 mg total) by mouth 2 (two) times daily. 06/13/18  Yes [name], MD  promethazine (PHENERGAN) 12.5 MG tablet Take 1 tablet (12.5 mg total) by mouth every 6 (six) hours as needed for nausea or vomiting. 05/02/18  Yes Ghimire, Henreitta Leber, MD  pyridostigmine (MESTINON) 60 MG tablet Take 60 mg by mouth 3 (three) times daily.   Yes [provider]  medroxyPROGESTERone (PROVERA) 10 MG tablet 1 tablet po BID until bleeding stops, then go  down to one tablet a day Patient not taking: Reported on 12/15/2018 11/05/18   Salvadore Dom, MD    Physical Exam: Vitals:   12/15/18 0835 12/15/18 0900 12/15/18 1015 12/15/18 1200  BP:  (!) 142/62 100/62 129/63  Pulse:  (!) 104 (!) 106 (!) 112  Resp:  (!) 42 (!) 23 (!) 22  Temp:      TempSrc:      SpO2:  100% 100% 100%  Weight: 68.9 kg     Height: 5' 1"  (1.549 m)       Constitutional: Generalized weakness, in mild to moderate distress due to nausea and vomiting, abdominal pain Vitals:   12/15/18 0835 12/15/18 0900 12/15/18 1015 12/15/18 1200  BP:  (!) 142/62 100/62 129/63  Pulse:  (!) 104 (!) 106 (!) 112  Resp:  (!) 42 (!) 23 (!) 22  Temp:      TempSrc:      SpO2:  100% 100% 100%  Weight: 68.9 kg     Height: 5' 1"  (1.549 m)      Eyes: PERRL, lids and conjunctivae normal ENMT: Mucous membranes are dry.  Neck: normal, supple, no masses, no thyromegaly Respiratory: Sinus tachycardia,clear to auscultation bilaterally, no wheezing, no crackles. Normal respiratory effort. No accessory muscle use.  Cardiovascular: Regular rate and rhythm, no murmurs / rubs / gallops. No extremity edema. 2+ pedal pulses. No carotid bruits.  Abdomen: no tenderness, no masses palpated. No hepatosplenomegaly. Bowel sounds positive.  Musculoskeletal: no clubbing / cyanosis. No joint deformity upper and lower extremities. Good ROM, no contractures. Normal muscle tone.  Skin: no rashes, lesions, ulcers. No induration Neurologic: CN 2-12 grossly intact. Sensation intact, DTR normal. Strength 5/5 in all 4.  Psychiatric: Normal judgment and insight. Alert and oriented x 3. Normal mood.   Foley Catheter:None  Labs on Admission: I have personally reviewed following labs and imaging studies  CBC: Recent Labs  Lab 12/15/18 0744  WBC 14.0*  HGB 11.2*  HCT 36.4  MCV 92.6  PLT 768   Basic Metabolic Panel: Recent Labs  Lab 12/15/18 0822  NA 139  K 4.5  CL 103  CO2 12*  GLUCOSE 501*  BUN  24*  CREATININE 1.41*  CALCIUM 9.4   GFR: Estimated Creatinine Clearance: 41.9 mL/min (A) (by C-G formula based on SCr of 1.41 mg/dL (H)). Liver Function Tests: Recent  Labs  Lab 12/15/18 0822  AST 18  ALT 16  ALKPHOS 77  BILITOT 1.3*  PROT 7.5  ALBUMIN 3.9   Recent Labs  Lab 12/15/18 0822  LIPASE 25   No results for input(s): AMMONIA in the last 168 hours. Coagulation Profile: No results for input(s): INR, PROTIME in the last 168 hours. Cardiac Enzymes: Recent Labs  Lab 12/15/18 0822  TROPONINI <0.03   BNP (last 3 results) No results for input(s): PROBNP in the last 8760 hours. HbA1C: No results for input(s): HGBA1C in the last 72 hours. CBG: Recent Labs  Lab 12/15/18 0740  GLUCAP 469*   Lipid Profile: No results for input(s): CHOL, HDL, LDLCALC, TRIG, CHOLHDL, LDLDIRECT in the last 72 hours. Thyroid Function Tests: No results for input(s): TSH, T4TOTAL, FREET4, T3FREE, THYROIDAB in the last 72 hours. Anemia Panel: No results for input(s): VITAMINB12, FOLATE, FERRITIN, TIBC, IRON, RETICCTPCT in the last 72 hours. Urine analysis:    Component Value Date/Time   COLORURINE YELLOW 11/26/2018 1606   APPEARANCEUR HAZY (A) 11/26/2018 1606   LABSPEC 1.022 11/26/2018 1606   PHURINE 6.0 11/26/2018 1606   GLUCOSEU >=500 (A) 11/26/2018 1606   HGBUR MODERATE (A) 11/26/2018 1606   BILIRUBINUR NEGATIVE 11/26/2018 1606   BILIRUBINUR negative 11/13/2018 1102   KETONESUR 80 (A) 11/26/2018 1606   PROTEINUR 100 (A) 11/26/2018 1606   UROBILINOGEN 0.2 11/13/2018 1102   UROBILINOGEN 0.2 04/27/2015 1608   NITRITE NEGATIVE 11/26/2018 1606   LEUKOCYTESUR SMALL (A) 11/26/2018 1606    Radiological Exams on Admission: [place] Ekg Site Rite  Result Date: 12/15/2018 If Site Rite image not attached, placement could not be confirmed due to current cardiac rhythm.    Assessment/Plan Principal Problem:   DKA (diabetic ketoacidoses) (HCC) Active Problems:   Diabetic neuropathy,  type I diabetes mellitus ([place])   Anxiety and depression   Diabetic gastroparesis (HCC)   AKI (acute kidney injury) ([place])   HLD (hyperlipidemia)   Essential hypertension   DKA, type 1 (HCC)   Tardive dyskinesia   CAD (coronary artery disease), native coronary artery   S/P CABG x 3    Diabetic ketoacidosis: Has history of diabetes type 1.  Presented with vomiting, abdominal pain, nausea.  Started on DKA protocol. Monitor BMP every 4 hours.  Monitor CBGs. Anion gap of 24, CO2 of 12 on presentation.  Uncontrolled diabetes mellitus type I: Follow-up hemoglobin A1c.  Diabetic coordinator consulted.  She follows with endocrinologist Dr. Dwyane Dee .On insulin at home.  Patient states she is compliant with her insulin intake.  Diabetic gastroparesis: Chronic diabetic gastroparesis.  We will try to avoid Reglan as much as possible.  Continue morphine for severe abdominal pain for now.  She is on Mestinon for diabetic gastroparesis.  Tardive dyskinesia: She has developed tardive dyskinesia due to Reglan use in the past.  Try to avoid Reglan as much as possible.  History of coronary artery disease: Status post CABG on last year July.  Currently stable.  Denies any abdominal pain.  On aspirin at home.  Hypertension: On metoprolol and losartan at home.  Losartan held due to AKI.  Continue to monitor blood pressure.  Acute kidney injury: Continue gentle IV fluids.  Continue to monitor BMP.  Anxiety/depression: Continue buspirone.  History of diabetic neuropathy:On  Lyrica  History of GERD: Continue PPI.  Severity of Illness: The appropriate patient status for this patient is INPATIENT.  DVT prophylaxis: heparin [place] Code Status: Full Family Communication: None present the bedside Consults called:  None     Shelly Coss MD [facility] Pager [phone]  If 7PM-7AM, please contact night-coverage [email or web address] Password [email or web address]  12/15/2018, 1:14 PM

## 2018-12-15 NOTE — Procedures (Signed)
Central Venous Catheter Insertion Procedure Note [name] [id number] 18-Aug-1965  Procedure: Insertion of Central Venous Catheter Indications: Assessment of intravascular volume, Drug and/or fluid administration and Frequent blood sampling, DKA Date or service : 12/15/18  Procedure Details Consent: Risks of procedure as well as the alternatives and risks of each were explained to the (patient/caregiver).  Consent for procedure obtained. Time Out: Verified patient identification, verified procedure, site/side was marked, verified correct patient position, special equipment/implants available, medications/allergies/relevent history reviewed, required imaging and test results available.  Performed  Maximum sterile technique was used including antiseptics, cap, gloves, gown, hand hygiene, mask and sheet. Skin prep: Chlorhexidine; local anesthetic administered A antimicrobial bonded/coated triple lumen catheter was placed in the left internal jugular vein using the Seldinger technique.  Evaluation Blood flow good Complications: No apparent complications Patient did tolerate procedure well. Chest X-ray ordered to verify placement.  CXR: normal. Tip of cvc in the svc  Procedure performed under direct ultrasound guidance for real time vessel cannulation.      [name] MD CCM 12/15/2018, 11:19 PM

## 2018-12-15 NOTE — ED Notes (Signed)
EKG given to [facility] MD

## 2018-12-15 NOTE — ED Notes (Signed)
Unsuccessful IV to left AC; ultrasound used. Provider aware. IV consult placed.

## 2018-12-15 NOTE — ED Notes (Signed)
Logan RN in room attempting to establish ultrasound IV access.

## 2018-12-15 NOTE — ED Provider Notes (Signed)
[facility] DEPT Provider Note   CSN: [id number] Arrival date & time: 12/15/18  0730     History   Chief Complaint Chief Complaint  Patient presents with   Hyperglycemia   Emesis    HPI [name] is a 53 y.o. female the past medical history of type 1 diabetes mellitus with insulin dependence.  Patient presents to the emergency department with abdominal pain nausea and vomiting.  EMS reports that her CBG was extremely high.  She complains of diffuse abdominal pain, intractable vomiting since yesterday.  This is the same as her previous episodes of gastroparesis.  She denies chest pain, shortness of breath.  She has a history of previous an STEMI and states that this does not feel the same.  She denies recent fevers or chills.  She denies recent cough.  The history is limited due to acuity of condition.     HPI  Past Medical History:  Diagnosis Date   AKI (acute kidney injury) ([place])    Anemia, iron deficiency    Anxiety and depression 05/18/2015   CAD (coronary artery disease), native coronary artery 01/11/2018   Non-STEMI with severe three-vessel disease noted July 2019   Depression    Diabetes mellitus without complication ([place])    Diabetic gastroparesis ([place])    Diabetic neuropathy, type I diabetes mellitus ([place]) 05/18/2015   DKA (diabetic ketoacidoses) ([place]) 06/16/2017   DKA, type 1 ([place]) 03/18/2016   Essential hypertension    Gastroparesis    GERD (gastroesophageal reflux disease)    HLD (hyperlipidemia)    MDD (major depressive disorder), single episode, severe , no psychosis ([place])    Non-ST elevation (NSTEMI) myocardial infarction ([place])    S/P CABG x 3 01/14/2018   Tardive dyskinesia    Vitamin B12 deficiency 08/16/2015    Patient Active Problem List   Diagnosis Date Noted   Diabetic ketoacidosis ([place]) 04/30/2018   Gastroparesis 04/13/2018   DKA (diabetic ketoacidosis) ([place]) 03/31/2018   S/P CABG x 3  01/14/2018   CAD (coronary artery disease), native coronary artery 01/11/2018   Dehydration    MDD (major depressive disorder), single episode, severe , no psychosis ([place])    DKA (diabetic ketoacidoses) ([place]) 06/16/2017   Tardive dyskinesia    DKA, type 1 ([place]) 03/18/2016   Noncompliance with treatment plan 03/13/2016   Essential hypertension 01/24/2016   HLD (hyperlipidemia)    GERD (gastroesophageal reflux disease)    AKI (acute kidney injury) ([place])    Anemia, iron deficiency    Vitamin B12 deficiency 08/16/2015   Diabetic gastroparesis ([place])    Diabetic neuropathy, type I diabetes mellitus ([place]) 05/18/2015   Anxiety and depression 05/18/2015    Past Surgical History:  Procedure Laterality Date   CARDIAC SURGERY     COLONOSCOPY  09/27/2014   at [place] N/A 01/14/2018   Procedure: CORONARY ARTERY BYPASS GRAFTING (CABG)X3, RIGHT AND LEFT SAPHENOUS VEIN HARVEST, MAMMARY TAKE DOWN. MAMMARY TO LAD, SVG TO PD, SVG TO DISTAL CIRC.;  Surgeon: Grace Isaac, MD;  Location: [facility];  Service: Open Heart Surgery;  Laterality: N/A;   ESOPHAGOGASTRODUODENOSCOPY  09/27/2014   at [facility], Dr Rolan Lipa. biospy neg for celiac, neg for H pylori.    EYE SURGERY     gailstones     IR FLUORO GUIDE CV LINE RIGHT  02/01/2017   IR FLUORO GUIDE CV LINE RIGHT  03/06/2017   IR FLUORO GUIDE CV LINE RIGHT  03/25/2017  IR GENERIC HISTORICAL  01/24/2016   IR FLUORO GUIDE CV LINE RIGHT 01/24/2016 Darrell K Allred, PA-C [facility]   IR GENERIC HISTORICAL  01/24/2016   IR US GUIDE VASC ACCESS RIGHT 01/24/2016 Darrell K Allred, PA-C [facility]   IR US GUIDE VASC ACCESS RIGHT  02/01/2017   IR US GUIDE VASC ACCESS RIGHT  03/06/2017   IR US GUIDE VASC ACCESS RIGHT  03/25/2017   LEFT HEART CATH AND CORONARY ANGIOGRAPHY N/A 01/07/2018   Procedure: LEFT HEART CATH AND CORONARY ANGIOGRAPHY;  Surgeon: Troy Sine, MD;  Location: [facility];  Service:  Cardiovascular;  Laterality: N/A;   POSTERIOR VITRECTOMY AND MEMBRANE PEEL-LEFT EYE  09/28/2002   POSTERIOR VITRECTOMY AND MEMBRANE PEEL-RIGHT EYE  03/16/2002   RETINAL DETACHMENT SURGERY     TEE WITHOUT CARDIOVERSION N/A 01/14/2018   Procedure: TRANSESOPHAGEAL ECHOCARDIOGRAM (TEE);  Surgeon: Grace Isaac, MD;  Location: [facility];  Service: Open Heart Surgery;  Laterality: N/A;     OB History    Gravida  2   Para      Term      Preterm      AB  1   Living  1     SAB      TAB  1   Ectopic      Multiple      Live Births               Home Medications    Prior to Admission medications   Medication Sig Start Date End Date Taking? Authorizing Provider  ammonium lactate (AMLACTIN) 12 % cream Apply topically as needed for dry skin. 07/08/18   Trula Slade, DPM  aspirin EC 81 MG tablet Take 1 tablet (81 mg total) by mouth daily. 06/03/18   Nahser, Wonda Cheng, MD  Blood Glucose Monitoring Suppl (ONETOUCH VERIO) w/Device KIT Use as directed 03/21/18   [name], MD  busPIRone (BUSPAR) 10 MG tablet Take 1 tablet (10 mg total) by mouth 3 (three) times daily. 06/12/18   [name], MD  diphenhydrAMINE (BENADRYL) 25 mg capsule Take 25 mg by mouth every 6 (six) hours as needed for allergies.    [provider]  docusate sodium (COLACE) 100 MG capsule Take 1 capsule (100 mg total) by mouth daily as needed for mild constipation. 02/03/18   Burtis Junes, NP  furosemide (LASIX) 40 MG tablet Take 1 tablet (40 mg total) by mouth daily as needed for fluid or edema. 06/12/18 06/07/19  [name], MD  glucose blood (ACCU-CHEK AVIVA PLUS) test strip Use as instructed to test blood sugar 5 times daily Dx E10.65 09/22/18   Elayne Snare, MD  insulin aspart (NOVOLOG) 100 UNIT/ML injection Inject 0-12 Units into the skin 3 (three) times daily before meals. As per sliding scale 10/14/18   Elayne Snare, MD  Insulin Glargine (LANTUS SOLOSTAR) 100 UNIT/ML Solostar Pen  Inject 15 units under the skin once daily in the morning. 10/14/18   Elayne Snare, MD  Insulin Syringe-Needle U-100 (BD INSULIN SYRINGE ULTRAFINE) 31G X 5/16" 0.5 ML MISC 1 each by Does not apply route 4 (four) times daily. 04/28/18   Elayne Snare, MD  Lancets [facility] DELICA PLUS [id number]) [place] 1 Units by Does not apply route as directed. 03/21/18   [name], MD  loperamide (IMODIUM) 2 MG capsule Take 1 capsule (2 mg total) by mouth as needed for diarrhea or loose stools. 04/15/18   Desiree Hane, MD  losartan (COZAAR) 25 MG tablet Take 1 tablet (25 mg total) by mouth daily. 10/08/18   Nahser, Wonda Cheng, MD  medroxyPROGESTERone (PROVERA) 10 MG tablet 1 tablet po BID until bleeding stops, then go down to one tablet a day 11/05/18   Salvadore Dom, MD  metoprolol tartrate (LOPRESSOR) 25 MG tablet Take 0.5 tablets (12.5 mg total) by mouth 2 (two) times daily. 10/08/18   Nahser, Wonda Cheng, MD  pantoprazole (PROTONIX) 40 MG tablet Take 1 tablet (40 mg total) by mouth daily. 06/12/18   [name], MD  potassium chloride (K-DUR) 10 MEQ tablet Take 1 tablet (10 mEq total) by mouth daily. Patient taking differently: Take 10 mEq by mouth as needed.  06/12/18   [name], MD  pregabalin (LYRICA) 150 MG capsule Take 1 capsule (150 mg total) by mouth 2 (two) times daily. 06/13/18   [name], MD  promethazine (PHENERGAN) 12.5 MG tablet Take 1 tablet (12.5 mg total) by mouth every 6 (six) hours as needed for nausea or vomiting. 05/02/18   Ghimire, Henreitta Leber, MD  pyridostigmine (MESTINON) 60 MG tablet Take 60 mg by mouth 3 (three) times daily.    [provider]    Family History Family History  Problem Relation Age of Onset   Cystic fibrosis Mother    Hypertension Father    Diabetes Brother    Hypertension Maternal Grandmother     Social History Social History   Tobacco Use   Smoking status: Never Smoker   Smokeless tobacco: Never Used  Substance Use Topics    Alcohol use: No   Drug use: Yes    Types: Marijuana    Comment: occ     Allergies   Anesthetics, amide; Penicillins; Buprenorphine hcl; Encainide; and Metoclopramide   Review of Systems Review of Systems  Unable to review systems secondary to acuity of condition Physical Exam Updated Vital Signs BP (!) 142/62    Pulse (!) 104    Temp 98.7 F (37.1 C) (Axillary)    Resp (!) 42    Ht '5\' 1"'$  (1.549 m)    Wt 68.9 kg    SpO2 100%    BMI 28.72 kg/m   Physical Exam Vitals signs and nursing note reviewed. Exam conducted with a chaperone present.  Constitutional:      Appearance: She is ill-appearing and diaphoretic.     Comments: Patient appears ill, involved in her internal processes  HENT:     Head: Normocephalic and atraumatic.     Mouth/Throat:     Mouth: Mucous membranes are dry.  Eyes:     Extraocular Movements: Extraocular movements intact.     Pupils: Pupils are equal, round, and reactive to light.  Neck:     Musculoskeletal: Neck supple.  Cardiovascular:     Rate and Rhythm: Tachycardia present.  Pulmonary:     Breath sounds: Normal breath sounds.     Comments: Deep [place] small respirations with tachypnea Abdominal:     General: There is no distension.     Palpations: There is no mass.     Tenderness: There is abdominal tenderness. There is no rebound.     Hernia: No hernia is present.  Musculoskeletal:        General: No swelling.  Skin:    General: Skin is warm.  Neurological:     General: No focal deficit present.     Mental Status: She is alert.      ED Treatments / Results  Labs (  all labs ordered are listed, but only abnormal results are displayed) Labs Reviewed  CBC - Abnormal; Notable for the following components:      Result Value   WBC 14.0 (*)    Hemoglobin 11.2 (*)    All other components within normal limits  COMPREHENSIVE METABOLIC PANEL - Abnormal; Notable for the following components:   CO2 12 (*)    Glucose, Bld 501 (*)    BUN 24  (*)    Creatinine, Ser 1.41 (*)    Total Bilirubin 1.3 (*)    GFR calc non Af Amer 43 (*)    GFR calc Af Amer 50 (*)    Anion gap 24 (*)    All other components within normal limits  CBG MONITORING, ED - Abnormal; Notable for the following components:   Glucose-Capillary 469 (*)    All other components within normal limits  SARS CORONAVIRUS 2 (HOSPITAL ORDER, [facility])  LIPASE, BLOOD  TROPONIN I  URINALYSIS, ROUTINE W REFLEX MICROSCOPIC  LACTIC ACID, PLASMA  CBG MONITORING, ED  I-STAT BETA HCG BLOOD, ED ([facility], [facility], [facility] ONLY)    EKG EKG Interpretation  Date/Time:  Monday December 15 2018 08:37:45 EDT Ventricular Rate:  105 PR Interval:    QRS Duration: 91 QT Interval:  344 QTC Calculation: 455 R Axis:   -32 Text Interpretation:  Sinus tachycardia Left axis deviation Borderline repolarization abnormality When compared with ECG of 04/30/2018 No significant change was found Confirmed by Francine Graven [id number]) on 12/15/2018 9:04:15 AM   Radiology No results found.  Procedures .Critical Care Performed by: Margarita Mail, PA-C Authorized by: Margarita Mail, PA-C   Critical care provider statement:    Critical care time (minutes):  50   Critical care time was exclusive of:  Separately billable procedures and treating other patients   Critical care was necessary to treat or prevent imminent or life-threatening deterioration of the following conditions:  Metabolic crisis   Critical care was time spent personally by me on the following activities:  Discussions with consultants, evaluation of patient's response to treatment, examination of patient, ordering and performing treatments and interventions, ordering and review of laboratory studies, ordering and review of radiographic studies, pulse oximetry, re-evaluation of patient's condition, obtaining history from patient or surrogate and review of old charts   (including critical care time)  EMERGENCY  DEPARTMENT  US GUIDANCE EXAM Emergency Ultrasound:  US Guidance for Needle Guidance  INDICATIONS: Difficult vascular access Linear probe used in real-time to visualize location of needle entry through skin.   PERFORMED BY: Myself IMAGES ARCHIVED?: No LIMITATIONS: None VIEWS USED: Transverse INTERPRETATION: Needle visualized within vein and Left EJ  Medications Ordered in ED Medications  dextrose 5 %-0.45 % sodium chloride infusion (has no administration in time range)  insulin regular, human (MYXREDLIN) 100 units/ 100 mL infusion (has no administration in time range)  diphenhydrAMINE (BENADRYL) injection 25 mg (has no administration in time range)  sodium chloride 0.9 % bolus 2,067 mL (has no administration in time range)  metoCLOPramide (REGLAN) injection 10 mg (10 mg Intravenous Given 12/15/18 0912)  fentaNYL (SUBLIMAZE) injection 50 mcg (50 mcg Intramuscular Given 12/15/18 0913)     Initial Impression / Assessment and Plan / ED Course  I have reviewed the triage vital signs and the nursing notes.  Pertinent labs & imaging results that were available during my care of the patient were reviewed by me and considered in my medical decision making (see chart for  details).  Clinical Course as of Dec 14 1105  Mon Dec 15, 2018  0958 Glucose(!!): 501 [AH]  0958 CO2(!): 12 [AH]  0958 BUN(!): 24 [AH]  0958 Creatinine(!): 1.41 [AH]  0958 Anion gap(!): 24 [AH]  0958 WBC(!): 14.0 [AH]  1105 We had significant difficulty establishing IV access.  I personally tried ultrasound-guided IV in bilateral extremities along with nurse Bard Herbert.  IV team was consulted and also unable to establish access.  I was able to obtain a left external jugular ultrasound-guided IV with good flow.   [AH]    Clinical Course User Index [AH] Margarita Mail, PA-C                 CC: Vomiting and hyperglycemia VS: Tachypneic, hypertensive FA:[id number] is gathered by patient and review of  EMR. DDX:The emergent differential diagnosis for vomiting includes, but is not limited to ACS/MI, Boerhaave's, DKA, Intracranial Hemorrhage, Ischemic bowel, Meningitis, Sepsis, Acute radiation syndrome, Acute gastric dilation, Acetaminophen toxicity, Adrenal insufficiency, Appendicitis, Aspirin toxicity, Bowel obstruction/ileus, Carbon monoxide poisoning, Cholecystitis, CNS tumor. Digoxin toxicity, Electrolyte abnormalities, Elevated ICP, Gastric outlet obstruction, Hyperemesis gravidarum, Pancreatitis, Peritonitis, Ruptured viscus, Testicular torsion/ovarian torsion, Theophyline toxicity, Biliary colic, Cannabinoid hyperemesis syndrome, Chemotherapy, Disulfiram effect, Erythromycin, ETOH, Gastritis, Gastroenteritis, Gastroparesis, Hepatitis, Ibuprofen, Ipecac toxicity, Labyrinthitis, Migraine, Motion sickness, Narcotic withdrawal, Thyroid, Pregnancy, Peptic ulcer disease, Renal colic, and UTI Labs: I reviewed the labs which show marked hyperglycemia, anion gap metabolic acidosis, creatinine is elevated but the patient does appear to have chronic renal insufficiency and appears to be to be at baseline.  Her lipase is normal, troponin negative.  CBC shows elevated white blood cell count and normocytic anemia.  Her i-STAT hCG is negative along with her SARS coronavirus. Imaging: EKG:  EKG Interpretation  Date/Time:  Monday December 15 2018 08:37:45 EDT Ventricular Rate:  105 PR Interval:    QRS Duration: 91 QT Interval:  344 QTC Calculation: 455 R Axis:   -32 Text Interpretation:  Sinus tachycardia Left axis deviation Borderline repolarization abnormality When compared with ECG of 04/30/2018 No significant change was found Confirmed by Francine Graven [phone]) on 12/15/2018 9:04:15 AM      MDM: 53 year old female with DKA, recurrent episodes of gastroparesis, history of poor medical compliance with her insulin.  I do not feel that the patient meets sepsis criteria but have been unable to run a lactic  acid on her although I do feel that this would be elevated in the setting of her severe dehydration and vomiting.  We had extreme difficulty accessing the patient and I was finally able to get an ultrasound-guided EJ placement on the patient however although this would accept fluids were unable to pull blood off of this line.  Patient will get an IR guided PICC line for placement.  I spoke with Dr. Tawanna Solo who will admit the patient to the stepdown unit Patient disposition: Admit Patient condition: Critical. The patient appears reasonably stabilized for admission considering the current resources, flow, and capabilities available in the ED at this time, and I doubt any other [facility] requiring further screening and/or treatment in the ED prior to admission.          Clayton Jarmon was evaluated in Emergency Department on 12/15/2018 for the symptoms described in the history of present illness. She was evaluated in the context of the global COVID-19 pandemic, which necessitated consideration that the patient might be at risk for infection with the SARS-CoV-2 virus that causes COVID-19. Institutional protocols and algorithms that  pertain to the evaluation of patients at risk for COVID-19 are in a state of rapid change based on information released by regulatory bodies including the CDC and federal and state organizations. These policies and algorithms were followed during the patient's care in the ED.   Final Clinical Impressions(s) / ED Diagnoses   Final diagnoses:  None    ED Discharge Orders    None       Margarita Mail, PA-C 12/15/18 [place], [place], DO 12/18/18 1121

## 2018-12-15 NOTE — Progress Notes (Signed)
Consulted for PIV. This nurse did assess patient with [place] BUE, but as stated before. Patient is known to have very poor vasculature and FYI that IR only places her PICC lines. Unable to located vein to place PIV access. Notified nurse. Lorrin Goodell, RN VAST

## 2018-12-15 NOTE — ED Notes (Signed)
ED TO INPATIENT HANDOFF REPORT  ED Nurse Name and Phone #: [phone] [place] Name/Age/Gender [name] 53 y.o. female Room/Bed: WA09/WA09  Code Status   Code Status: Full Code  Home/SNF/Other Home Patient oriented to: self, place, time and situation Is this baseline? Yes   Triage Complete: Triage complete  Chief Complaint hyperglycemia  Triage Note Per EMS pt from home with hyperglycemia with CBG of 499; emesis x1 day; complaint of 9/10 abdominal pain. Pt non compliant with medications. Pt noted to have +4 pitting edema to both lower extremities.    Allergies Allergies  Allergen Reactions  . Anesthetics, Amide Nausea And Vomiting  . Penicillins Diarrhea, Nausea And Vomiting and Other (See Comments)    Has patient had a PCN reaction causing immediate rash, facial/tongue/throat swelling, SOB or lightheadedness with hypotension: Yes Has patient had a PCN reaction causing severe rash involving mucus membranes or skin necrosis: No Has patient had a PCN reaction that required hospitalization No Has patient had a PCN reaction occurring within the last 10 years: Yes  If all of the above answers are "NO", then may proceed with Cephalosporin use.   . Buprenorphine Hcl Rash  . Encainide Nausea And Vomiting  . Metoclopramide Other (See Comments)    Dystonia, muscle rigidity.  Patient states she is only allergic to the "pill form, not the IV"    Level of Care/Admitting Diagnosis ED Disposition    ED Disposition Condition [place]: [place] [[id number]]  Level of Care: Stepdown [14]  Admit to SDU based on following criteria: Hemodynamic compromise or significant risk of instability:  Patient requiring short term acute titration and management of vasoactive drips, and invasive monitoring (i.e., CVP and Arterial line).  Covid Evaluation: Confirmed COVID Negative  Diagnosis: DKA (diabetic ketoacidoses) [facility]) [id number]  Admitting  Physician: Shelly Coss [[id number]]  Attending Physician: Shelly Coss [[id number]]  Estimated length of stay: past midnight tomorrow  Certification:: I certify this patient will need inpatient services for at least 2 midnights  PT Class (Do Not Modify): Inpatient [101]  PT Acc Code (Do Not Modify): Private [1]       B Medical/Surgery History Past Medical History:  Diagnosis Date  . AKI (acute kidney injury) ([place])   . Anemia, iron deficiency   . Anxiety and depression 05/18/2015  . CAD (coronary artery disease), native coronary artery 01/11/2018   Non-STEMI with severe three-vessel disease noted July 2019  . Depression   . Diabetes mellitus without complication ([place])   . Diabetic gastroparesis ([place])   . Diabetic neuropathy, type I diabetes mellitus ([place]) 05/18/2015  . DKA (diabetic ketoacidoses) ([place]) 06/16/2017  . DKA, type 1 ([place]) 03/18/2016  . Essential hypertension   . Gastroparesis   . GERD (gastroesophageal reflux disease)   . HLD (hyperlipidemia)   . MDD (major depressive disorder), single episode, severe , no psychosis ([place])   . Non-ST elevation (NSTEMI) myocardial infarction ([place])   . S/P CABG x 3 01/14/2018  . Tardive dyskinesia   . Vitamin B12 deficiency 08/16/2015   Past Surgical History:  Procedure Laterality Date  . CARDIAC SURGERY    . COLONOSCOPY  09/27/2014   at [facility]  . CORONARY ARTERY BYPASS GRAFT N/A 01/14/2018   Procedure: CORONARY ARTERY BYPASS GRAFTING (CABG)X3, RIGHT AND LEFT SAPHENOUS VEIN HARVEST, MAMMARY TAKE DOWN. MAMMARY TO LAD, SVG TO PD, SVG TO DISTAL CIRC.;  Surgeon: Grace Isaac, MD;  Location: [place];  Service: Open Heart  Surgery;  Laterality: N/A;  . ESOPHAGOGASTRODUODENOSCOPY  09/27/2014   at [facility], Dr Rolan Lipa. biospy neg for celiac, neg for H pylori.   . EYE SURGERY    . gailstones    . IR FLUORO GUIDE CV LINE RIGHT  02/01/2017  . IR FLUORO GUIDE CV LINE RIGHT  03/06/2017  . IR FLUORO GUIDE CV LINE RIGHT  03/25/2017  . IR  GENERIC HISTORICAL  01/24/2016   IR FLUORO GUIDE CV LINE RIGHT 01/24/2016 Darrell K Allred, PA-C [facility]-INTERV RAD  . IR GENERIC HISTORICAL  01/24/2016   IR US GUIDE VASC ACCESS RIGHT 01/24/2016 Darrell K Allred, PA-C [facility]INTER[facility]  . IR US GUIDE VASC ACCESS RIGHT  02/01/2017  . IR US GUIDE VASC ACCESS RIGHT  03/06/2017  . IR US GUIDE VASC ACCESS RIGHT  03/25/2017  . LEFT HEART CATH AND CORONARY ANGIOGRAPHY N/A 01/07/2018   Procedure: LEFT HEART CATH AND CORONARY ANGIOGRAPHY;  Surgeon: Troy Sine, MD;  Location: [place] CV LAB;  Service: Cardiovascular;  Laterality: N/A;  . POSTERIOR VITRECTOMY AND MEMBRANE PEEL-LEFT EYE  09/28/2002  . POSTERIOR VITRECTOMY AND MEMBRANE PEEL-RIGHT EYE  03/16/2002  . RETINAL DETACHMENT SURGERY    . TEE WITHOUT CARDIOVERSION N/A 01/14/2018   Procedure: TRANSESOPHAGEAL ECHOCARDIOGRAM (TEE);  Surgeon: Grace Isaac, MD;  Location: [place];  Service: Open Heart Surgery;  Laterality: N/A;     A IV Location/Drains/Wounds Patient Lines/Drains/Airways Status   Active Line/Drains/Airways    Name:   Placement date:   Placement time:   Site:   Days:   Peripheral IV 12/15/18   12/15/18    1100    -   less than 1   Incision (Closed) 01/14/18 Chest Mid   01/14/18    0855     335   Incision (Closed) 01/14/18 Leg Right   01/14/18    0855     335   Incision (Closed) 01/14/18 Leg Left   01/14/18    1014     335          Intake/Output Last 24 hours  Intake/Output Summary (Last 24 hours) at 12/15/2018 1605 Last data filed at 12/15/2018 1518 Gross per 24 hour  Intake 14.22 ml  Output -  Net 14.22 ml    Labs/Imaging Results for orders placed or performed during the hospital encounter of 12/15/18 (from the past 48 hour(s))  CBG monitoring, ED     Status: Abnormal   Collection Time: 12/15/18  7:40 AM  Result Value Ref Range   Glucose-Capillary 469 (H) 70 - 99 mg/dL  CBC     Status: Abnormal   Collection Time: 12/15/18  7:44 AM  Result Value Ref Range   WBC 14.0 (H) 4.0  - 10.5 K/uL   RBC 3.93 3.87 - 5.11 MIL/uL   Hemoglobin 11.2 (L) 12.0 - 15.0 g/dL   HCT 36.4 36.0 - 46.0 %   MCV 92.6 80.0 - 100.0 fL   MCH 28.5 26.0 - 34.0 pg   MCHC 30.8 30.0 - 36.0 g/dL   RDW 12.8 11.5 - 15.5 %   Platelets 203 150 - 400 K/uL   nRBC 0.0 0.0 - 0.2 %    Comment: Performed at [facility], [place] [address] [place], [place] [address]  Comprehensive metabolic panel     Status: Abnormal   Collection Time: 12/15/18  8:22 AM  Result Value Ref Range   Sodium 139 135 - 145 mmol/L    Comment: REPEATED TO  VERIFY   Potassium 4.5 3.5 - 5.1 mmol/L   Chloride 103 98 - 111 mmol/L    Comment: REPEATED TO VERIFY   CO2 12 (L) 22 - 32 mmol/L    Comment: REPEATED TO VERIFY   Glucose, Bld 501 (HH) 70 - 99 mg/dL    Comment: REPEATED TO VERIFY CRITICAL RESULT CALLED TO, READ BACK BY AND VERIFIED WITH: ROSSER,M. RN AT 0938 12/15/18 MULLINS,T    BUN 24 (H) 6 - 20 mg/dL   Creatinine, Ser 1.41 (H) 0.44 - 1.00 mg/dL   Calcium 9.4 8.9 - 10.3 mg/dL   Total Protein 7.5 6.5 - 8.1 g/dL   Albumin 3.9 3.5 - 5.0 g/dL   AST 18 15 - 41 U/L   ALT 16 0 - 44 U/L   Alkaline Phosphatase 77 38 - 126 U/L   Total Bilirubin 1.3 (H) 0.3 - 1.2 mg/dL   GFR calc non Af Amer 43 (L) >60 mL/min   GFR calc Af Amer 50 (L) >60 mL/min   Anion gap 24 (H) 5 - 15    Comment: REPEATED TO VERIFY Performed at [place] [address], [place], [place] [address]   Lipase, blood     Status: None   Collection Time: 12/15/18  8:22 AM  Result Value Ref Range   Lipase 25 11 - 51 U/L    Comment: Performed at [facility], [place] [address], [place], [place] [address]  Troponin I - ONCE - STAT     Status: None   Collection Time: 12/15/18  8:22 AM  Result Value Ref Range   Troponin I <0.03 <0.03 ng/mL    Comment: Performed at [facility], [place] [address] [place], [place] [address]  SARS Coronavirus 2 (CEPHEID- Performed in [place] hospital  lab), Hosp Order     Status: None   Collection Time: 12/15/18  8:23 AM   Specimen: Nasopharyngeal Swab  Result Value Ref Range   SARS Coronavirus 2 NEGATIVE NEGATIVE    Comment: (NOTE) If result is NEGATIVE SARS-CoV-2 target nucleic acids are NOT DETECTED. The SARS-CoV-2 RNA is generally detectable in upper and lower  respiratory specimens during the acute phase of infection. The lowest  concentration of SARS-CoV-2 viral copies this assay can detect is 250  copies / mL. A negative result does not preclude SARS-CoV-2 infection  and should not be used as the sole basis for treatment or other  patient management decisions.  A negative result may occur with  improper specimen collection / handling, submission of specimen other  than nasopharyngeal swab, presence of viral mutation(s) within the  areas targeted by this assay, and inadequate number of viral copies  (<250 copies / mL). A negative result must be combined with clinical  observations, patient history, and epidemiological information. If result is POSITIVE SARS-CoV-2 target nucleic acids are DETECTED. The SARS-CoV-2 RNA is generally detectable in upper and lower  respiratory specimens dur ing the acute phase of infection.  Positive  results are indicative of active infection with SARS-CoV-2.  Clinical  correlation with patient history and other diagnostic information is  necessary to determine patient infection status.  Positive results do  not rule out bacterial infection or co-infection with other viruses. If result is PRESUMPTIVE POSTIVE SARS-CoV-2 nucleic acids MAY BE PRESENT.   A presumptive positive result was obtained on the submitted specimen  and confirmed on repeat testing.  While 2019 novel coronavirus  (SARS-CoV-2) nucleic acids may be present  in the submitted sample  additional confirmatory testing may be necessary for epidemiological  and / or clinical management purposes  to differentiate between  SARS-CoV-2 and  other Sarbecovirus currently known to infect humans.  If clinically indicated additional testing with an alternate test  methodology [phone]) is advised. The SARS-CoV-2 RNA is generally  detectable in upper and lower respiratory sp ecimens during the acute  phase of infection. The expected result is Negative. Fact Sheet for Patients:  StrictlyIdeas.no Fact Sheet for Healthcare Providers: BankingDealers.co.za This test is not yet approved or cleared by the [place] FDA and has been authorized for detection and/or diagnosis of SARS-CoV-2 by FDA under an Emergency Use Authorization (EUA).  This EUA will remain in effect (meaning this test can be used) for the duration of the COVID-19 declaration under Section 564(b)(1) of the Act, 21 U.S.C. section 360bbb-3(b)(1), unless the authorization is terminated or revoked sooner. Performed at [facility], [place] [address] [place], [place] [address]   I-Stat beta hCG blood, ED     Status: None   Collection Time: 12/15/18  8:32 AM  Result Value Ref Range   I-stat hCG, quantitative <5.0 <5 mIU/mL   Comment 3            Comment:   GEST. AGE      CONC.  (mIU/mL)   <=1 WEEK        5 - 50     2 WEEKS       50 - 500     3 WEEKS       100 - 10,000     4 WEEKS     1,000 - 30,000        FEMALE AND NON-PREGNANT FEMALE:     LESS THAN 5 mIU/mL   CBG monitoring, ED     Status: Abnormal   Collection Time: 12/15/18  1:13 PM  Result Value Ref Range   Glucose-Capillary 396 (H) 70 - 99 mg/dL  CBG monitoring, ED     Status: Abnormal   Collection Time: 12/15/18  3:14 PM  Result Value Ref Range   Glucose-Capillary 342 (H) 70 - 99 mg/dL   [place] Ekg Site Rite  Result Date: 12/15/2018 If Site Rite image not attached, placement could not be confirmed due to current cardiac rhythm.   Pending Labs Unresulted Labs (From admission, onward)    Start     Ordered   12/16/18 5631  Basic metabolic  panel  Tomorrow morning,   R     12/15/18 1307   12/16/18 0500  CBC  Tomorrow morning,   R     12/15/18 1307   12/15/18 4970  Basic metabolic panel  Now then every 4 hours,   R (with STAT occurrences)     12/15/18 1319   12/15/18 1313  Hemoglobin A1c  Add-on,   AD     12/15/18 1313   12/15/18 1307  Creatinine, serum  (heparin)  Once,   STAT    Comments: Baseline for heparin therapy IF NOT ALREADY DRAWN.    12/15/18 1307   12/15/18 0744  Urinalysis, Routine w reflex microscopic  Once,   STAT     12/15/18 0743          Vitals/Pain Today's Vitals   12/15/18 1245 12/15/18 1345 12/15/18 1400 12/15/18 1545  BP: (!) 121/51 (!) 114/50 (!) 114/50 (!) 115/51  Pulse: (!) 109 (!) 105 (!) 107 (!) 103  Resp: 15 16 14  13  Temp:      TempSrc:      SpO2: 100% 100% 100% 100%  Weight:      Height:      PainSc:        Isolation Precautions Droplet and Contact precautions  Medications Medications  dextrose 5 %-0.45 % sodium chloride infusion ( Intravenous Hold 12/15/18 1124)  insulin regular, human (MYXREDLIN) 100 units/ 100 mL infusion ( Intravenous Rate/Dose Verify 12/15/18 1518)  sodium chloride 0.9 % bolus 2,067 mL (0 mLs Intravenous Hold 12/15/18 1325)  aspirin EC tablet 81 mg (has no administration in time range)  metoprolol tartrate (LOPRESSOR) tablet 12.5 mg (has no administration in time range)  busPIRone (BUSPAR) tablet 10 mg (has no administration in time range)  docusate sodium (COLACE) capsule 100 mg (has no administration in time range)  pantoprazole (PROTONIX) EC tablet 40 mg (has no administration in time range)  pregabalin (LYRICA) capsule 150 mg (has no administration in time range)  pyridostigmine (MESTINON) tablet 60 mg (has no administration in time range)  heparin injection 5,000 Units (has no administration in time range)  0.9 %  sodium chloride infusion (has no administration in time range)  morphine 2 MG/ML injection 2 mg (has no administration in time range)   ondansetron (ZOFRAN) injection 4 mg (has no administration in time range)  diphenhydrAMINE (BENADRYL) injection 25 mg (25 mg Intravenous Given 12/15/18 1122)  metoCLOPramide (REGLAN) injection 10 mg (10 mg Intravenous Given 12/15/18 0912)  fentaNYL (SUBLIMAZE) injection 50 mcg (50 mcg Intramuscular Given 12/15/18 0913)    Mobility walks with person assist Low fall risk   Focused Assessments Neuro Assessment Handoff:  Swallow screen pass? Yes  Cardiac Rhythm: Sinus tachycardia       Neuro Assessment:   Neuro Checks:      Last Documented NIHSS Modified Score:   Has TPA been given? No If patient is a Neuro Trauma and patient is going to OR before floor call report to [place] nurse: [phone] or [phone]     R Recommendations: See Admitting Provider Note  Report given to:   Additional Notes:

## 2018-12-16 ENCOUNTER — Inpatient Hospital Stay (HOSPITAL_COMMUNITY): Payer: Medicare HMO

## 2018-12-16 DIAGNOSIS — F419 Anxiety disorder, unspecified: Secondary | ICD-10-CM

## 2018-12-16 DIAGNOSIS — E785 Hyperlipidemia, unspecified: Secondary | ICD-10-CM

## 2018-12-16 DIAGNOSIS — I2510 Atherosclerotic heart disease of native coronary artery without angina pectoris: Secondary | ICD-10-CM

## 2018-12-16 DIAGNOSIS — R110 Nausea: Secondary | ICD-10-CM

## 2018-12-16 DIAGNOSIS — E1143 Type 2 diabetes mellitus with diabetic autonomic (poly)neuropathy: Secondary | ICD-10-CM

## 2018-12-16 DIAGNOSIS — R197 Diarrhea, unspecified: Secondary | ICD-10-CM

## 2018-12-16 DIAGNOSIS — F329 Major depressive disorder, single episode, unspecified: Secondary | ICD-10-CM

## 2018-12-16 DIAGNOSIS — K3184 Gastroparesis: Secondary | ICD-10-CM

## 2018-12-16 DIAGNOSIS — Z951 Presence of aortocoronary bypass graft: Secondary | ICD-10-CM

## 2018-12-16 DIAGNOSIS — G2401 Drug induced subacute dyskinesia: Secondary | ICD-10-CM

## 2018-12-16 DIAGNOSIS — E0810 Diabetes mellitus due to underlying condition with ketoacidosis without coma: Secondary | ICD-10-CM

## 2018-12-16 LAB — CBC
HCT: 33.1 % — ABNORMAL LOW (ref 36.0–46.0)
Hemoglobin: 10.4 g/dL — ABNORMAL LOW (ref 12.0–15.0)
MCH: 28.7 pg (ref 26.0–34.0)
MCHC: 31.4 g/dL (ref 30.0–36.0)
MCV: 91.4 fL (ref 80.0–100.0)
Platelets: 323 10*3/uL (ref 150–400)
RBC: 3.62 MIL/uL — ABNORMAL LOW (ref 3.87–5.11)
RDW: 13.2 % (ref 11.5–15.5)
WBC: 14.2 10*3/uL — ABNORMAL HIGH (ref 4.0–10.5)
nRBC: 0 % (ref 0.0–0.2)

## 2018-12-16 LAB — URINALYSIS, ROUTINE W REFLEX MICROSCOPIC
Bilirubin Urine: NEGATIVE
Glucose, UA: >=500 mg/dL — AB
Ketones, ur: NEGATIVE mg/dL
Nitrite: NEGATIVE
Protein, ur: 30 mg/dL — AB
Specific Gravity, Urine: 1.024 (ref 1.005–1.030)
WBC, UA: >50 WBC/hpf — ABNORMAL HIGH (ref 0–5)
pH: 6 (ref 5.0–8.0)

## 2018-12-16 LAB — GLUCOSE, CAPILLARY
Glucose-Capillary: 110 mg/dL — ABNORMAL HIGH (ref 70–99)
Glucose-Capillary: 152 mg/dL — ABNORMAL HIGH (ref 70–99)
Glucose-Capillary: 194 mg/dL — ABNORMAL HIGH (ref 70–99)
Glucose-Capillary: 205 mg/dL — ABNORMAL HIGH (ref 70–99)
Glucose-Capillary: 236 mg/dL — ABNORMAL HIGH (ref 70–99)
Glucose-Capillary: 286 mg/dL — ABNORMAL HIGH (ref 70–99)
Glucose-Capillary: 296 mg/dL — ABNORMAL HIGH (ref 70–99)
Glucose-Capillary: 352 mg/dL — ABNORMAL HIGH (ref 70–99)
Glucose-Capillary: 69 mg/dL — ABNORMAL LOW (ref 70–99)

## 2018-12-16 LAB — BASIC METABOLIC PANEL
Anion gap: 7 (ref 5–15)
Anion gap: 11 (ref 5–15)
BUN: 32 mg/dL — ABNORMAL HIGH (ref 6–20)
BUN: 31 mg/dL — ABNORMAL HIGH (ref 6–20)
CO2: 24 mmol/L (ref 22–32)
CO2: 21 mmol/L — ABNORMAL LOW (ref 22–32)
Calcium: 9.1 mg/dL (ref 8.9–10.3)
Calcium: 8.8 mg/dL — ABNORMAL LOW (ref 8.9–10.3)
Chloride: 113 mmol/L — ABNORMAL HIGH (ref 98–111)
Chloride: 110 mmol/L (ref 98–111)
Creatinine, Ser: 1.75 mg/dL — ABNORMAL HIGH (ref 0.44–1.00)
Creatinine, Ser: 1.64 mg/dL — ABNORMAL HIGH (ref 0.44–1.00)
GFR calc Af Amer: 38 mL/min — ABNORMAL LOW (ref >60)
GFR calc Af Amer: 42 mL/min — ABNORMAL LOW (ref >60)
GFR calc non Af Amer: 33 mL/min — ABNORMAL LOW (ref >60)
GFR calc non Af Amer: 36 mL/min — ABNORMAL LOW (ref >60)
Glucose, Bld: 138 mg/dL — ABNORMAL HIGH (ref 70–99)
Glucose, Bld: 75 mg/dL (ref 70–99)
Potassium: 3.2 mmol/L — ABNORMAL LOW (ref 3.5–5.1)
Potassium: 3.5 mmol/L (ref 3.5–5.1)
Sodium: 144 mmol/L (ref 135–145)
Sodium: 142 mmol/L (ref 135–145)

## 2018-12-16 MED ORDER — INSULIN GLARGINE 100 UNIT/ML ~~LOC~~ SOLN
15.0000 [IU] | Freq: Every day | SUBCUTANEOUS | Status: DC
  Administered 2018-12-16: 15 [IU] via SUBCUTANEOUS
  Filled 2018-12-16 (×2): qty 0.15

## 2018-12-16 MED ORDER — INSULIN ASPART 100 UNIT/ML ~~LOC~~ SOLN
0.0000 [IU] | Freq: Every day | SUBCUTANEOUS | Status: DC
  Administered 2018-12-16: 3 [IU] via SUBCUTANEOUS

## 2018-12-16 MED ORDER — INSULIN ASPART 100 UNIT/ML ~~LOC~~ SOLN
0.0000 [IU] | Freq: Three times a day (TID) | SUBCUTANEOUS | Status: DC
  Administered 2018-12-16: 5 [IU] via SUBCUTANEOUS
  Administered 2018-12-16: 9 [IU] via SUBCUTANEOUS

## 2018-12-16 MED ORDER — DEXTROSE-NACL 5-0.45 % IV SOLN
INTRAVENOUS | Status: DC
  Administered 2018-12-16 – 2018-12-17 (×2): via INTRAVENOUS

## 2018-12-16 MED ORDER — INSULIN GLARGINE 100 UNIT/ML ~~LOC~~ SOLN
10.0000 [IU] | Freq: Once | SUBCUTANEOUS | Status: AC
  Administered 2018-12-16: 10 [IU] via SUBCUTANEOUS
  Filled 2018-12-16: qty 0.1

## 2018-12-16 MED ORDER — SODIUM CHLORIDE 0.9% FLUSH
10.0000 mL | Freq: Two times a day (BID) | INTRAVENOUS | Status: DC
  Administered 2018-12-16 – 2018-12-17 (×2): 10 mL

## 2018-12-16 MED ORDER — SODIUM CHLORIDE 0.9% FLUSH: 10.0000 mL | INTRAVENOUS | Status: DC | PRN

## 2018-12-16 NOTE — Progress Notes (Signed)
Inpatient Diabetes Program Recommendations  AACE/ADA: New Consensus Statement on Inpatient Glycemic Control (2015)  Target Ranges:  Prepandial:   less than 140 mg/dL      Peak postprandial:   less than 180 mg/dL (1-2 hours)      Critically ill patients:  140 - 180 mg/dL   Lab Results  Component Value Date   GLUCAP 296 (H) 12/16/2018   HGBA1C 9.8 (H) 12/15/2018    Review of Glycemic Control  Diabetes history: DM1 Outpatient Diabetes medications: Lantus 15 units QD, Novolog 0-12 units tid  Current orders for Inpatient glycemic control: Lantus 10 units (1x @ 0315), Novolog 0-9 units tidwc and hs   HgbA1C - 9.8% - uncontrolled. HgbA1C up from 8.1%.  Inpatient Diabetes Program Recommendations:     Lantus 15 units QD Novolog 3 units tidwc for meal coverage insulin if pt eats > 50% meal.  Spoke with pt regarding importance of taking her insulin even when she's unable to eat. Reviewed sick day rules. Encouraged pt to check blood sugars more frequently and take her insulin even when she is unable to eat due to gastroparesis. Pt stated she sometimes eats 1 food item for meal - such as a potato, or a banana. Encouraged her to a variety of foods for well-balanced diet.  Pt voices understanding.  Has appt with Dr. Dwyane Dee on 6/25 and states she will likely cancel this. Encouraged to reschedule appt as soon as possible.   Discussed above with RN.  Thank you. Lorenda Peck, RD, LDN, CDE Inpatient Diabetes Coordinator [phone]

## 2018-12-16 NOTE — TOC Initial Note (Signed)
Transition of Care [facility]) - Initial/Assessment Note    Patient Details  Name: [name] MRN: [id number] Date of Birth: 01/02/1966  [facility] CM/SW Contact:    Lynnell Catalan, RN Phone Number: 12/16/2018, 10:41 AM   PCP:  [name], MD Pharmacy:   [id number] [address], [place] [address] Phone: [phone] Fax: [phone]  [id number] [place], [address] [place] [address] Phone: [phone] Fax: [phone]     Readmission Risk Interventions Readmission Risk Prevention Plan 12/16/2018  Transportation Screening Complete  Medication Review (RN Care Manager) Complete  PCP or Specialist appointment within 3-5 days of discharge Not Complete  PCP/Specialist Appt Not Complete comments Not ready for [place] or [place] Not Complete  HRI or Home Care Consult Pt Refusal Comments NA  SW Recovery Care/Counseling Consult Not Complete  SW Consult Not Complete Comments NA  Palliative Care Screening Not Complete  Comments NA  [place] Not Complete  SNF Comments NA  Some recent data might be hidden   Marney Doctor RN,BSN [phone]

## 2018-12-16 NOTE — Progress Notes (Signed)
Patient with history of difficult venous access requiring peripheral and central venous catheter placements in the past.  IR consulted for PICC placement, however this is not preferred for short term access needs.  Dr. Laurence Ferrari recommended central venous catheter placement for acute hospitalization.  This was placed overnight by CCM.  Order for PICC has been canceled.   [name], MS RD PA-C

## 2018-12-16 NOTE — Progress Notes (Signed)
PROGRESS NOTE    Chontel Warning  XAJ:[id number] DOB: [birth year] DOA: 12/15/2018 PCP: [name], MD   Brief Narrative:   53 year old with history of diabetic gastroparesis, diabetes mellitus type 1 insulin-dependent, depression, coronary artery disease, history of tardive dyskinesia on Reglan, hyperlipidemia came to the hospital complains of nausea vomiting and abdominal pain.  According to the patient she had not been feeling well over past few days therefore she is only been taking half the dose of her insulin.  Upon admission she was found to be in diabetic ketoacidosis.  Assessment & Plan:   Principal Problem:   DKA (diabetic ketoacidoses) ([place]) Active Problems:   Diabetic neuropathy, type I diabetes mellitus ([place])   Anxiety and depression   Diabetic gastroparesis ([facility])   AKI (acute kidney injury) ([place])   HLD (hyperlipidemia)   Essential hypertension   DKA, type 1 ([facility])   Tardive dyskinesia   CAD (coronary artery disease), native coronary artery   S/P CABG x 3  Diabetic ketoacidosis Diabetes mellitus type 1, insulin-dependent, uncontrolled Nausea and vomiting secondary to diabetic gastroparesis - At this time anion gap/DKA has resolved.  Transition to subcutaneous insulin.  P.o. intake is still inconsistent therefore will continue D5 half-normal saline.  Blood glucose this morning is 69. -Check hemoglobin A1c.  Consult diabetic coordinator. -Provide supportive care. -Antiemetics PRN.  Intermittent watery diarrhea with history of constipation - No obvious signs of infection.  WBC elevated but appears to be reactive.  If diarrhea becomes persistent, will check stool studies including C. difficile.  Order KUB.  Acute kidney injury -Baseline creatinine appears to be 1.0.  Improving and trending down with IV fluids.  We will continue to monitor.  Avoid nephrotoxic drugs.  History of Tardive kinesia -Avoid use of Reglan.  Coronary artery disease status post CABG -On  aspirin at home.  Currently stable without chest pain.  History of anxiety and depression -Continue home meds.    DVT prophylaxis: Subcutaneous heparin Code Status: Full code Family Communication: None at bedside Disposition Plan: Maintain hospital stay for at least next 24 hours until patient is able to tolerate oral diet.  Currently very nauseous and inconsistent oral intake.  She is also hyperglycemic with blood glucose of 69.  Having intermittent diarrhea which needs to be evaluated.  Unsafe for discharge today.  Consultants:   None  Procedures:   None  Antimicrobials:   None   Subjective: Inconsistent oral intake.  Having watery diarrhea, 2 episodes this morning.  Tells me she has history of constipation.  Also tells me she has not been taking home insulin due to feeling of nausea and vomiting and overall not feeling well.  Off-and-on takes half a dose of her routine insulin.  Review of Systems Otherwise negative except as per HPI, including: General: Denies fever, chills, night sweats or unintended weight loss. Resp: Denies cough, wheezing, shortness of breath. Cardiac: Denies chest pain, palpitations, orthopnea, paroxysmal nocturnal dyspnea. GI: Denies  vomiting, diarrhea or constipation GU: Denies dysuria, frequency, hesitancy or incontinence MS: Denies muscle aches, joint pain or swelling Neuro: Denies headache, neurologic deficits (focal weakness, numbness, tingling), abnormal gait Psych: Denies anxiety, depression, SI/HI/AVH Skin: Denies new rashes or lesions ID: Denies sick contacts, exotic exposures, travel  Objective: Vitals:   12/16/18 0100 12/16/18 0200 12/16/18 0300 12/16/18 0400  BP: (!) 90/41 (!) 102/47 (!) 101/49 133/64  Pulse: 75 75 75 78  Resp: 20 13 15  (!) 23  Temp:    98 F (36.7 C)  TempSrc:    Oral  SpO2: 100% 100% 100% 100%  Weight:      Height:        Intake/Output Summary (Last 24 hours) at 12/16/2018 0905 Last data filed at  12/16/2018 0414 Gross per 24 hour  Intake 1137.76 ml  Output -  Net 1137.76 ml   Filed Weights   12/15/18 0835  Weight: 68.9 kg    Examination:  General exam: Slight distress due to overall abdominal discomfort.  Dry mouth. Respiratory system: Clear to auscultation. Respiratory effort normal. Cardiovascular system: S1 & S2 heard, RRR. No JVD, murmurs, rubs, gallops or clicks. No pedal edema. Gastrointestinal system: Abdomen is nondistended, soft and nontender. No organomegaly or masses felt. Normal bowel sounds heard. Central nervous system: Alert and oriented. No focal neurological deficits. Extremities: Symmetric 5 x 5 power. Skin: No rashes, lesions or ulcers Psychiatry: Judgement and insight appear normal. Mood & affect appropriate.     Data Reviewed:   CBC: Recent Labs  Lab 12/15/18 0744 12/16/18 0615  WBC 14.0* 14.2*  HGB 11.2* 10.4*  HCT 36.4 33.1*  MCV 92.6 91.4  PLT 203 315   Basic Metabolic Panel: Recent Labs  Lab 12/15/18 0822 12/15/18 1738 12/15/18 2056 12/16/18 0153 12/16/18 0615  NA 139 143 145 144 142  K 4.5 3.9 3.6 3.2* 3.5  CL 103 108 113* 113* 110  CO2 12* 16* 18* 24 21*  GLUCOSE 501* 258* 147* 138* 75  BUN 24* 29* 30* 32* 31*  CREATININE 1.41* 1.71* 1.64* 1.75* 1.64*  CALCIUM 9.4 10.0 9.3 9.1 8.8*   GFR: Estimated Creatinine Clearance: 36 mL/min (A) (by C-G formula based on SCr of 1.64 mg/dL (H)). Liver Function Tests: Recent Labs  Lab 12/15/18 0822  AST 18  ALT 16  ALKPHOS 77  BILITOT 1.3*  PROT 7.5  ALBUMIN 3.9   Recent Labs  Lab 12/15/18 0822  LIPASE 25   No results for input(s): AMMONIA in the last 168 hours. Coagulation Profile: No results for input(s): INR, PROTIME in the last 168 hours. Cardiac Enzymes: Recent Labs  Lab 12/15/18 0822  TROPONINI <0.03   BNP (last 3 results) No results for input(s): PROBNP in the last 8760 hours. HbA1C: Recent Labs    12/15/18 1313  HGBA1C 9.8*   CBG: Recent Labs  Lab  12/15/18 2332 12/16/18 0010 12/16/18 0143 12/16/18 0300 12/16/18 0742  GLUCAP 194* 205* 152* 110* 69*   Lipid Profile: No results for input(s): CHOL, HDL, LDLCALC, TRIG, CHOLHDL, LDLDIRECT in the last 72 hours. Thyroid Function Tests: No results for input(s): TSH, T4TOTAL, FREET4, T3FREE, THYROIDAB in the last 72 hours. Anemia Panel: No results for input(s): VITAMINB12, FOLATE, FERRITIN, TIBC, IRON, RETICCTPCT in the last 72 hours. Sepsis Labs: No results for input(s): PROCALCITON, LATICACIDVEN in the last 168 hours.  Recent Results (from the past 240 hour(s))  SARS Coronavirus 2 (CEPHEID- Performed in [facility] hospital lab), Hosp Order     Status: None   Collection Time: 12/15/18  8:23 AM   Specimen: Nasopharyngeal Swab  Result Value Ref Range Status   SARS Coronavirus 2 NEGATIVE NEGATIVE Final    Comment: (NOTE) If result is NEGATIVE SARS-CoV-2 target nucleic acids are NOT DETECTED. The SARS-CoV-2 RNA is generally detectable in upper and lower  respiratory specimens during the acute phase of infection. The lowest  concentration of SARS-CoV-2 viral copies this assay can detect is 250  copies / mL. A negative result does not preclude SARS-CoV-2 infection  and should  not be used as the sole basis for treatment or other  patient management decisions.  A negative result may occur with  improper specimen collection / handling, submission of specimen other  than nasopharyngeal swab, presence of viral mutation(s) within the  areas targeted by this assay, and inadequate number of viral copies  (<250 copies / mL). A negative result must be combined with clinical  observations, patient history, and epidemiological information. If result is POSITIVE SARS-CoV-2 target nucleic acids are DETECTED. The SARS-CoV-2 RNA is generally detectable in upper and lower  respiratory specimens dur ing the acute phase of infection.  Positive  results are indicative of active infection with  SARS-CoV-2.  Clinical  correlation with patient history and other diagnostic information is  necessary to determine patient infection status.  Positive results do  not rule out bacterial infection or co-infection with other viruses. If result is PRESUMPTIVE POSTIVE SARS-CoV-2 nucleic acids MAY BE PRESENT.   A presumptive positive result was obtained on the submitted specimen  and confirmed on repeat testing.  While 2019 novel coronavirus  (SARS-CoV-2) nucleic acids may be present in the submitted sample  additional confirmatory testing may be necessary for epidemiological  and / or clinical management purposes  to differentiate between  SARS-CoV-2 and other Sarbecovirus currently known to infect humans.  If clinically indicated additional testing with an alternate test  methodology [phone]) is advised. The SARS-CoV-2 RNA is generally  detectable in upper and lower respiratory sp ecimens during the acute  phase of infection. The expected result is Negative. Fact Sheet for Patients:  StrictlyIdeas.no Fact Sheet for Healthcare Providers: BankingDealers.co.za This test is not yet approved or cleared by the [place] FDA and has been authorized for detection and/or diagnosis of SARS-CoV-2 by FDA under an Emergency Use Authorization (EUA).  This EUA will remain in effect (meaning this test can be used) for the duration of the COVID-19 declaration under Section 564(b)(1) of the Act, 21 U.S.C. section 360bbb-3(b)(1), unless the authorization is terminated or revoked sooner. Performed at [facility], [place] [address] [place], [place] [address]   MRSA PCR Screening     Status: None   Collection Time: 12/15/18  4:40 PM   Specimen: Nasopharyngeal  Result Value Ref Range Status   MRSA by PCR NEGATIVE NEGATIVE Final    Comment:        The GeneXpert MRSA Assay (FDA approved for NASAL specimens only), is one component of a  comprehensive MRSA colonization surveillance program. It is not intended to diagnose MRSA infection nor to guide or monitor treatment for MRSA infections. Performed at [facility], [place] [address] [place], [place] [address]          Radiology Studies: Dg Chest Port 1 View  Result Date: 12/15/2018 CLINICAL DATA:  53 year old female status post central line placement. EXAM: PORTABLE CHEST 1 VIEW COMPARISON:  Chest radiograph dated 02/18/2018 FINDINGS: Left IJ central venous line with tip at the cavoatrial junction. No pneumothorax. The lungs are clear. There is no pleural effusion. The cardiac silhouette is within normal limits. Median sternotomy wires and CABG vascular clips. No acute osseous pathology. IMPRESSION: Left IJ central venous line with tip at the cavoatrial junction. No pneumothorax. Electronically Signed   By: Anner Crete M.D.   On: 12/15/2018 23:34   [place] Ekg Site Rite  Result Date: 12/15/2018 If Site Rite image not attached, placement could not be confirmed due to current cardiac rhythm.  Scheduled Meds: . aspirin EC  81 mg Oral Daily  . busPIRone  10 mg Oral TID  . Chlorhexidine Gluconate Cloth  6 each Topical Q0600  . heparin  5,000 Units Subcutaneous Q8H  . insulin aspart  0-5 Units Subcutaneous QHS  . insulin aspart  0-9 Units Subcutaneous TID WC  . mouth rinse  15 mL Mouth Rinse BID  . metoprolol tartrate  12.5 mg Oral BID  . pantoprazole  40 mg Oral Daily  . pregabalin  150 mg Oral BID  . pyridostigmine  60 mg Oral TID  . sodium chloride flush  10-40 mL Intracatheter Q12H   Continuous Infusions: . dextrose 5 % and 0.45% NaCl    . sodium chloride Stopped (12/15/18 1325)     LOS: 1 day   Time spent= 35 mins    Kairon Shock Arsenio Loader, MD [facility]  If 7PM-7AM, please contact night-coverage [email or web address] 12/16/2018, 9:05 AM

## 2018-12-17 ENCOUNTER — Ambulatory Visit (HOSPITAL_COMMUNITY): Payer: Self-pay

## 2018-12-17 DIAGNOSIS — E1040 Type 1 diabetes mellitus with diabetic neuropathy, unspecified: Secondary | ICD-10-CM

## 2018-12-17 DIAGNOSIS — I10 Essential (primary) hypertension: Secondary | ICD-10-CM

## 2018-12-17 DIAGNOSIS — N179 Acute kidney failure, unspecified: Secondary | ICD-10-CM

## 2018-12-17 LAB — MAGNESIUM: Magnesium: 2 mg/dL (ref 1.7–2.4)

## 2018-12-17 LAB — CBC
HCT: 29 % — ABNORMAL LOW (ref 36.0–46.0)
Hemoglobin: 9 g/dL — ABNORMAL LOW (ref 12.0–15.0)
MCH: 28.3 pg (ref 26.0–34.0)
MCHC: 31 g/dL (ref 30.0–36.0)
MCV: 91.2 fL (ref 80.0–100.0)
Platelets: 287 10*3/uL (ref 150–400)
RBC: 3.18 MIL/uL — ABNORMAL LOW (ref 3.87–5.11)
RDW: 13.1 % (ref 11.5–15.5)
WBC: 5.5 10*3/uL (ref 4.0–10.5)
nRBC: 0 % (ref 0.0–0.2)

## 2018-12-17 LAB — BASIC METABOLIC PANEL
Anion gap: 7 (ref 5–15)
BUN: 20 mg/dL (ref 6–20)
CO2: 22 mmol/L (ref 22–32)
Calcium: 8 mg/dL — ABNORMAL LOW (ref 8.9–10.3)
Chloride: 107 mmol/L (ref 98–111)
Creatinine, Ser: 1.22 mg/dL — ABNORMAL HIGH (ref 0.44–1.00)
GFR calc Af Amer: 59 mL/min — ABNORMAL LOW (ref >60)
GFR calc non Af Amer: 51 mL/min — ABNORMAL LOW (ref >60)
Glucose, Bld: 94 mg/dL (ref 70–99)
Potassium: 3.2 mmol/L — ABNORMAL LOW (ref 3.5–5.1)
Sodium: 136 mmol/L (ref 135–145)

## 2018-12-17 LAB — GLUCOSE, CAPILLARY
Glucose-Capillary: 95 mg/dL (ref 70–99)
Glucose-Capillary: 79 mg/dL (ref 70–99)

## 2018-12-17 MED ORDER — SODIUM CHLORIDE 0.9 % IV BOLUS
1000.0000 mL | Freq: Once | INTRAVENOUS | Status: AC
  Administered 2018-12-17: 1000 mL via INTRAVENOUS

## 2018-12-17 MED ORDER — PROMETHAZINE HCL 12.5 MG PO TABS: 12.5000 mg | ORAL_TABLET | Freq: Three times a day (TID) | ORAL | 0 refills | Status: DC | PRN

## 2018-12-17 MED ORDER — POTASSIUM CHLORIDE CRYS ER 20 MEQ PO TBCR
40.0000 meq | EXTENDED_RELEASE_TABLET | Freq: Once | ORAL | Status: AC
  Administered 2018-12-17: 40 meq via ORAL
  Filled 2018-12-17: qty 2

## 2018-12-17 MED ORDER — POTASSIUM CHLORIDE 10 MEQ/50ML IV SOLN
10.0000 meq | INTRAVENOUS | Status: AC
  Administered 2018-12-17 (×2): 10 meq via INTRAVENOUS
  Filled 2018-12-17 (×2): qty 50

## 2018-12-17 NOTE — Progress Notes (Signed)
Patient given discharge instructions and all questions answered. Discharged via [employer] to home.

## 2018-12-17 NOTE — Discharge Summary (Signed)
Physician Discharge Summary  Amariyana Heacox JHE:[id number] DOB: 06-07-1966 DOA: 12/15/2018  PCP: [name], MD  Admit date: 12/15/2018 Discharge date: 12/17/2018  Admitted From:Home Disposition: Home  Recommendations for Outpatient Follow-up:  1. Follow up with PCP in 1 weeks 2. Please obtain BMP/CBC in one week your next doctors visit.  3. Patient is out of promethazine therefore prescription provided 4. Advised to continue taking her home regimen of Lantus and check her blood glucose 3 times a day before meals and before bedtime.  Discharge Condition: Stable CODE STATUS: Full Diet recommendation: Diabetic  Brief/Interim Summary: 53 year old with history of diabetic gastroparesis, diabetes mellitus type 1 insulin-dependent, depression, coronary artery disease, history of tardive dyskinesia on Reglan, hyperlipidemia came to the hospital complains of nausea vomiting and abdominal pain.  According to the patient she had not been feeling well over past few days therefore she is only been taking half the dose of her insulin.  Upon admission she was found to be in diabetic ketoacidosis.  Over several hours her DKA resolved and was transitioned to subcutaneous insulin.  First day she was also having some episode of nausea likely secondary to her gastroparesis and 3-4 episodes of watery diarrhea.  This morning patient feels much better and advised her to remain compliant with her medications.  Her hemoglobin A1c is elevated at 9.8.  She is requesting refill for her promethazine which I have prescribed. Stable for discharge today.   Discharge Diagnoses:  Principal Problem:   DKA (diabetic ketoacidoses) ([place]) Active Problems:   Diabetic neuropathy, type I diabetes mellitus ([place])   Anxiety and depression   Diabetic gastroparesis (HCC)   AKI (acute kidney injury) ([place])   HLD (hyperlipidemia)   Essential hypertension   DKA, type 1 (HCC)   Tardive dyskinesia   CAD (coronary artery disease),  native coronary artery   S/P CABG x 3  Diabetic ketoacidosis, resolved Diabetes mellitus type 1, insulin-dependent, uncontrolled Nausea and vomiting secondary to diabetic gastroparesis, improved - DKA has resolved.  Advised patient to resume her home regimen of Lantus and rest of subcu insulin.  Her hemoglobin A1c is elevated at 9.8, will need aggressive control as outpatient.  Tolerating oral diet today.  Denies any nausea and vomiting.  Intermittent watery diarrhea with history of constipation -  Resolved, KUB is negative  Acute kidney injury, resolved with IV fluids -Baseline creatinine appears to be 1.0.  Trending down to 1.2 with IV fluids  History of Tardive kinesia -Avoid use of Reglan.  Coronary artery disease status post CABG -On aspirin at home.  Currently stable without chest pain.  History of anxiety and depression -Continue home meds.   Consultations:  Diabetic coordinator  Subjective: No acute events overnight, tolerating oral diet.  Discharge Exam: Vitals:   12/17/18 0900 12/17/18 1000  BP: 122/61 (!) 96/49  Pulse: 69 71  Resp: 14 (!) 2  Temp:    SpO2: 99% 99%   Vitals:   12/17/18 0600 12/17/18 0800 12/17/18 0900 12/17/18 1000  BP: (!) 90/43 (!) 115/53 122/61 (!) 96/49  Pulse: 67 68 69 71  Resp: '14 16 14 '$ (!) 2  Temp:  98.5 F (36.9 C)    TempSrc:  Oral    SpO2: 98% 97% 99% 99%  Weight:      Height:        General: Pt is alert, awake, not in acute distress Cardiovascular: RRR, S1/S2 +, no rubs, no gallops Respiratory: CTA bilaterally, no wheezing, no rhonchi Abdominal: Soft, NT, ND,  bowel sounds + Extremities: no edema, no cyanosis  Discharge Instructions  Discharge Instructions    Call MD for:  persistant dizziness or light-headedness   Complete by: As directed    Diet - low sodium heart healthy   Complete by: As directed    Increase activity slowly   Complete by: As directed      Allergies as of 12/17/2018      Reactions    Anesthetics, Amide Nausea And Vomiting   Penicillins Diarrhea, Nausea And Vomiting, Other (See Comments)   Has patient had a PCN reaction causing immediate rash, facial/tongue/throat swelling, SOB or lightheadedness with hypotension: Yes Has patient had a PCN reaction causing severe rash involving mucus membranes or skin necrosis: No Has patient had a PCN reaction that required hospitalization No Has patient had a PCN reaction occurring within the last 10 years: Yes  If all of the above answers are "NO", then may proceed with Cephalosporin use.   Buprenorphine Hcl Rash   Encainide Nausea And Vomiting   Metoclopramide Other (See Comments)   Dystonia, muscle rigidity.  Patient states she is only allergic to the "pill form, not the IV"      Medication List    TAKE these medications   ammonium lactate 12 % cream Commonly known as: AMLACTIN Apply topically as needed for dry skin.   aspirin EC 81 MG tablet Take 1 tablet (81 mg total) by mouth daily.   busPIRone 10 MG tablet Commonly known as: BUSPAR Take 1 tablet (10 mg total) by mouth 3 (three) times daily.   diphenhydrAMINE 25 mg capsule Commonly known as: BENADRYL Take 25 mg by mouth every 6 (six) hours as needed for allergies.   docusate sodium 100 MG capsule Commonly known as: Colace Take 1 capsule (100 mg total) by mouth daily as needed for mild constipation.   furosemide 40 MG tablet Commonly known as: LASIX Take 1 tablet (40 mg total) by mouth daily as needed for fluid or edema.   glucose blood test strip Commonly known as: Accu-Chek Aviva Plus Use as instructed to test blood sugar 5 times daily Dx E10.65   insulin aspart 100 UNIT/ML injection Commonly known as: novoLOG Inject 0-12 Units into the skin 3 (three) times daily before meals. As per sliding scale   Insulin Glargine 100 UNIT/ML Solostar Pen Commonly known as: Lantus SoloStar Inject 15 units under the skin once daily in the morning.   Insulin  Syringe-Needle U-100 31G X 5/16" 0.5 ML Misc Commonly known as: BD Insulin Syringe Ultrafine 1 each by Does not apply route 4 (four) times daily.   loperamide 2 MG capsule Commonly known as: IMODIUM Take 1 capsule (2 mg total) by mouth as needed for diarrhea or loose stools.   losartan 25 MG tablet Commonly known as: COZAAR Take 1 tablet (25 mg total) by mouth daily.   medroxyPROGESTERone 10 MG tablet Commonly known as: PROVERA 1 tablet po BID until bleeding stops, then go down to one tablet a day   metoprolol tartrate 25 MG tablet Commonly known as: LOPRESSOR Take 0.5 tablets (12.5 mg total) by mouth 2 (two) times daily.   OneTouch Delica Plus [id number] Misc 1 Units by Does not apply route as directed.   OneTouch Verio w/Device Kit Use as directed   pantoprazole 40 MG tablet Commonly known as: PROTONIX Take 1 tablet (40 mg total) by mouth daily.   potassium chloride 10 MEQ tablet Commonly known as: K-DUR Take 1 tablet (10 mEq total)  by mouth daily.   pregabalin 150 MG capsule Commonly known as: Lyrica Take 1 capsule (150 mg total) by mouth 2 (two) times daily.   promethazine 12.5 MG tablet Commonly known as: PHENERGAN Take 1 tablet (12.5 mg total) by mouth every 8 (eight) hours as needed for up to 30 doses for nausea or vomiting. What changed: when to take this   pyridostigmine 60 MG tablet Commonly known as: MESTINON Take 60 mg by mouth 3 (three) times daily.      Follow-up Information    [name], MD. Schedule an appointment as soon as possible for a visit in 1 week(s).   Specialty: Family Medicine Contact information: [place] [address] [phone]        Nahser, Wonda Cheng, MD .   Specialty: Cardiology Contact information: [address] [place] [address] [phone]          Allergies  Allergen Reactions  . Anesthetics, Amide Nausea And Vomiting  . Penicillins Diarrhea, Nausea And Vomiting  and Other (See Comments)    Has patient had a PCN reaction causing immediate rash, facial/tongue/throat swelling, SOB or lightheadedness with hypotension: Yes Has patient had a PCN reaction causing severe rash involving mucus membranes or skin necrosis: No Has patient had a PCN reaction that required hospitalization No Has patient had a PCN reaction occurring within the last 10 years: Yes  If all of the above answers are "NO", then may proceed with Cephalosporin use.   . Buprenorphine Hcl Rash  . Encainide Nausea And Vomiting  . Metoclopramide Other (See Comments)    Dystonia, muscle rigidity.  Patient states she is only allergic to the "pill form, not the IV"    You were cared for by a hospitalist during your hospital stay. If you have any questions about your discharge medications or the care you received while you were in the hospital after you are discharged, you can call the unit and asked to speak with the hospitalist on call if the hospitalist that took care of you is not available. Once you are discharged, your primary care physician will handle any further medical issues. Please note that no refills for any discharge medications will be authorized once you are discharged, as it is imperative that you return to your primary care physician (or establish a relationship with a primary care physician if you do not have one) for your aftercare needs so that they can reassess your need for medications and monitor your lab values.   Procedures/Studies: Ct Abdomen Pelvis Wo Contrast  Result Date: 11/26/2018 CLINICAL DATA:  Generalized abdominal pain EXAM: CT ABDOMEN AND PELVIS WITHOUT CONTRAST TECHNIQUE: Multidetector CT imaging of the abdomen and pelvis was performed following the standard protocol without IV contrast. COMPARISON:  CT abdomen pelvis, 05/31/2015, pelvic ultrasound, 11/13/2018 FINDINGS: Lower chest: No acute abnormality.  Fluid in the lower esophagus. Hepatobiliary: No focal liver  abnormality is seen. Status post cholecystectomy. No biliary dilatation. Pancreas: Unremarkable. No pancreatic ductal dilatation or surrounding inflammatory changes. Spleen: Normal in size without focal abnormality. Adrenals/Urinary Tract: Adrenal glands are unremarkable. Kidneys are normal, without renal calculi, focal lesion, or hydronephrosis. Bladder is unremarkable. Stomach/Bowel: Stomach is within normal limits. Appendix appears normal. No evidence of bowel wall thickening, distention, or inflammatory changes. Vascular/Lymphatic: Calcific atherosclerosis. No enlarged abdominal or pelvic lymph nodes. Reproductive: Calcified fibroid of the uterine fundus. 3.1 cm fluid attenuation lesion of the left ovary, in keeping with simple follicles seen on  recent prior ultrasound. Other: No abdominal wall hernia or abnormality. No abdominopelvic ascites. Musculoskeletal: No acute or significant osseous findings. IMPRESSION: 1. No acute noncontrast CT findings of the abdomen or pelvis to explain pain. 2.  Fluid in the lower esophagus, which may be seen with reflux. 3.  Other chronic and incidental findings as detailed above. Electronically Signed   By: Eddie Candle M.D.   On: 11/26/2018 19:23   Dg Abd 1 View  Result Date: 12/16/2018 CLINICAL DATA:  history of diabetic gastroparesis, diabetes mellitus type 1 insulin-dependent, depression, coronary artery disease, history of tardive dyskinesia on Reglan, hyperlipidemia came to the hospital complains of nausea vomiting and abdominal pain. According to the patient she had not been feeling well over past few days therefore she is only been taking half the dose of her insulin. Upon admission she was found to be in diabetic ketoacidosis. Severe constipation for several days. EXAM: ABDOMEN - 1 VIEW COMPARISON:  CT 11/26/2018 and previous FINDINGS: The bowel gas pattern is normal. Cholecystectomy clips. No radio-opaque calculi or other significant radiographic abnormality are  seen. IMPRESSION: Negative. Electronically Signed   By: Lucrezia Europe M.D.   On: 12/16/2018 15:07   Dg Chest Port 1 View  Result Date: 12/15/2018 CLINICAL DATA:  53 year old female status post central line placement. EXAM: PORTABLE CHEST 1 VIEW COMPARISON:  Chest radiograph dated 02/18/2018 FINDINGS: Left IJ central venous line with tip at the cavoatrial junction. No pneumothorax. The lungs are clear. There is no pleural effusion. The cardiac silhouette is within normal limits. Median sternotomy wires and CABG vascular clips. No acute osseous pathology. IMPRESSION: Left IJ central venous line with tip at the cavoatrial junction. No pneumothorax. Electronically Signed   By: Anner Crete M.D.   On: 12/15/2018 23:34   [place] Ekg Site Rite  Result Date: 12/15/2018 If Site Rite image not attached, placement could not be confirmed due to current cardiac rhythm.     The results of significant diagnostics from this hospitalization (including imaging, microbiology, ancillary and laboratory) are listed below for reference.     Microbiology: Recent Results (from the past 240 hour(s))  SARS Coronavirus 2 (CEPHEID- Performed in [place] hospital lab), Hosp Order     Status: None   Collection Time: 12/15/18  8:23 AM   Specimen: Nasopharyngeal Swab  Result Value Ref Range Status   SARS Coronavirus 2 NEGATIVE NEGATIVE Final    Comment: (NOTE) If result is NEGATIVE SARS-CoV-2 target nucleic acids are NOT DETECTED. The SARS-CoV-2 RNA is generally detectable in upper and lower  respiratory specimens during the acute phase of infection. The lowest  concentration of SARS-CoV-2 viral copies this assay can detect is 250  copies / mL. A negative result does not preclude SARS-CoV-2 infection  and should not be used as the sole basis for treatment or other  patient management decisions.  A negative result may occur with  improper specimen collection / handling, submission of specimen other  than  nasopharyngeal swab, presence of viral mutation(s) within the  areas targeted by this assay, and inadequate number of viral copies  (<250 copies / mL). A negative result must be combined with clinical  observations, patient history, and epidemiological information. If result is POSITIVE SARS-CoV-2 target nucleic acids are DETECTED. The SARS-CoV-2 RNA is generally detectable in upper and lower  respiratory specimens dur ing the acute phase of infection.  Positive  results are indicative of active infection with SARS-CoV-2.  Clinical  correlation with patient history and  other diagnostic information is  necessary to determine patient infection status.  Positive results do  not rule out bacterial infection or co-infection with other viruses. If result is PRESUMPTIVE POSTIVE SARS-CoV-2 nucleic acids MAY BE PRESENT.   A presumptive positive result was obtained on the submitted specimen  and confirmed on repeat testing.  While 2019 novel coronavirus  (SARS-CoV-2) nucleic acids may be present in the submitted sample  additional confirmatory testing may be necessary for epidemiological  and / or clinical management purposes  to differentiate between  SARS-CoV-2 and other Sarbecovirus currently known to infect humans.  If clinically indicated additional testing with an alternate test  methodology [phone]) is advised. The SARS-CoV-2 RNA is generally  detectable in upper and lower respiratory sp ecimens during the acute  phase of infection. The expected result is Negative. Fact Sheet for Patients:  StrictlyIdeas.no Fact Sheet for Healthcare Providers: BankingDealers.co.za This test is not yet approved or cleared by the [place] FDA and has been authorized for detection and/or diagnosis of SARS-CoV-2 by FDA under an Emergency Use Authorization (EUA).  This EUA will remain in effect (meaning this test can be used) for the duration of  the COVID-19 declaration under Section 564(b)(1) of the Act, 21 U.S.C. section 360bbb-3(b)(1), unless the authorization is terminated or revoked sooner. Performed at [facility], [place] [address] [place], [place] [address]   MRSA PCR Screening     Status: None   Collection Time: 12/15/18  4:40 PM   Specimen: Nasopharyngeal  Result Value Ref Range Status   MRSA by PCR NEGATIVE NEGATIVE Final    Comment:        The GeneXpert MRSA Assay (FDA approved for NASAL specimens only), is one component of a comprehensive MRSA colonization surveillance program. It is not intended to diagnose MRSA infection nor to guide or monitor treatment for MRSA infections. Performed at [facility], [place] [address] [place], [place] [address]      Labs: BNP (last 3 results) No results for input(s): BNP in the last 8760 hours. Basic Metabolic Panel: Recent Labs  Lab 12/15/18 1738 12/15/18 2056 12/16/18 0153 12/16/18 0615 12/17/18 0730  NA 143 145 144 142 136  K 3.9 3.6 3.2* 3.5 3.2*  CL 108 113* 113* 110 107  CO2 16* 18* 24 21* 22  GLUCOSE 258* 147* 138* 75 94  BUN 29* 30* 32* 31* 20  CREATININE 1.71* 1.64* 1.75* 1.64* 1.22*  CALCIUM 10.0 9.3 9.1 8.8* 8.0*  MG  --   --   --   --  2.0   Liver Function Tests: Recent Labs  Lab 12/15/18 0822  AST 18  ALT 16  ALKPHOS 77  BILITOT 1.3*  PROT 7.5  ALBUMIN 3.9   Recent Labs  Lab 12/15/18 0822  LIPASE 25   No results for input(s): AMMONIA in the last 168 hours. CBC: Recent Labs  Lab 12/15/18 0744 12/16/18 0615 12/17/18 0730  WBC 14.0* 14.2* 5.5  HGB 11.2* 10.4* 9.0*  HCT 36.4 33.1* 29.0*  MCV 92.6 91.4 91.2  PLT 203 323 287   Cardiac Enzymes: Recent Labs  Lab 12/15/18 0822  TROPONINI <0.03   BNP: Invalid input(s): POCBNP CBG: Recent Labs  Lab 12/16/18 1030 12/16/18 1146 12/16/18 1625 12/16/18 2128 12/17/18 0736  GLUCAP 236* 296* 352* 286* 95   D-Dimer No results for  input(s): DDIMER in the last 72 hours. Hgb A1c Recent Labs    12/15/18 1313  HGBA1C 9.8*  Lipid Profile No results for input(s): CHOL, HDL, LDLCALC, TRIG, CHOLHDL, LDLDIRECT in the last 72 hours. Thyroid function studies No results for input(s): TSH, T4TOTAL, T3FREE, THYROIDAB in the last 72 hours.  Invalid input(s): FREET3 Anemia work up No results for input(s): VITAMINB12, FOLATE, FERRITIN, TIBC, IRON, RETICCTPCT in the last 72 hours. Urinalysis    Component Value Date/Time   COLORURINE YELLOW 12/15/2018 0744   APPEARANCEUR HAZY (A) 12/15/2018 0744   LABSPEC 1.024 12/15/2018 0744   PHURINE 6.0 12/15/2018 0744   GLUCOSEU >=500 (A) 12/15/2018 0744   HGBUR MODERATE (A) 12/15/2018 0744   BILIRUBINUR NEGATIVE 12/15/2018 0744   BILIRUBINUR negative 11/13/2018 1102   KETONESUR NEGATIVE 12/15/2018 0744   PROTEINUR 30 (A) 12/15/2018 0744   UROBILINOGEN 0.2 11/13/2018 1102   UROBILINOGEN 0.2 04/27/2015 1608   NITRITE NEGATIVE 12/15/2018 0744   LEUKOCYTESUR LARGE (A) 12/15/2018 0744   Sepsis Labs Invalid input(s): PROCALCITONIN,  WBC,  LACTICIDVEN Microbiology Recent Results (from the past 240 hour(s))  SARS Coronavirus 2 (CEPHEID- Performed in [place] hospital lab), Hosp Order     Status: None   Collection Time: 12/15/18  8:23 AM   Specimen: Nasopharyngeal Swab  Result Value Ref Range Status   SARS Coronavirus 2 NEGATIVE NEGATIVE Final    Comment: (NOTE) If result is NEGATIVE SARS-CoV-2 target nucleic acids are NOT DETECTED. The SARS-CoV-2 RNA is generally detectable in upper and lower  respiratory specimens during the acute phase of infection. The lowest  concentration of SARS-CoV-2 viral copies this assay can detect is 250  copies / mL. A negative result does not preclude SARS-CoV-2 infection  and should not be used as the sole basis for treatment or other  patient management decisions.  A negative result may occur with  improper specimen collection / handling,  submission of specimen other  than nasopharyngeal swab, presence of viral mutation(s) within the  areas targeted by this assay, and inadequate number of viral copies  (<250 copies / mL). A negative result must be combined with clinical  observations, patient history, and epidemiological information. If result is POSITIVE SARS-CoV-2 target nucleic acids are DETECTED. The SARS-CoV-2 RNA is generally detectable in upper and lower  respiratory specimens dur ing the acute phase of infection.  Positive  results are indicative of active infection with SARS-CoV-2.  Clinical  correlation with patient history and other diagnostic information is  necessary to determine patient infection status.  Positive results do  not rule out bacterial infection or co-infection with other viruses. If result is PRESUMPTIVE POSTIVE SARS-CoV-2 nucleic acids MAY BE PRESENT.   A presumptive positive result was obtained on the submitted specimen  and confirmed on repeat testing.  While 2019 novel coronavirus  (SARS-CoV-2) nucleic acids may be present in the submitted sample  additional confirmatory testing may be necessary for epidemiological  and / or clinical management purposes  to differentiate between  SARS-CoV-2 and other Sarbecovirus currently known to infect humans.  If clinically indicated additional testing with an alternate test  methodology [phone]) is advised. The SARS-CoV-2 RNA is generally  detectable in upper and lower respiratory sp ecimens during the acute  phase of infection. The expected result is Negative. Fact Sheet for Patients:  StrictlyIdeas.no Fact Sheet for Healthcare Providers: BankingDealers.co.za This test is not yet approved or cleared by the [place] FDA and has been authorized for detection and/or diagnosis of SARS-CoV-2 by FDA under an Emergency Use Authorization (EUA).  This EUA will remain in effect (meaning this test can  be  used) for the duration of the COVID-19 declaration under Section 564(b)(1) of the Act, 21 U.S.C. section 360bbb-3(b)(1), unless the authorization is terminated or revoked sooner. Performed at [facility], [place] [address] [place], [place] [address]   MRSA PCR Screening     Status: None   Collection Time: 12/15/18  4:40 PM   Specimen: Nasopharyngeal  Result Value Ref Range Status   MRSA by PCR NEGATIVE NEGATIVE Final    Comment:        The GeneXpert MRSA Assay (FDA approved for NASAL specimens only), is one component of a comprehensive MRSA colonization surveillance program. It is not intended to diagnose MRSA infection nor to guide or monitor treatment for MRSA infections. Performed at [facility], [place] [address], [place], [place] [address]      Time coordinating discharge:  I have spent 35 minutes face to face with the patient and on the ward discussing the patients care, assessment, plan and disposition with other care givers. >50% of the time was devoted counseling the patient about the risks and benefits of treatment/Discharge disposition and coordinating care.   SIGNED:   Damita Lack, MD  [facility] 12/17/2018, 10:55 AM   If 7PM-7AM, please contact night-coverage [email or web address]

## 2018-12-18 ENCOUNTER — Encounter: Payer: Self-pay | Admitting: Endocrinology

## 2018-12-18 ENCOUNTER — Other Ambulatory Visit: Payer: Self-pay

## 2018-12-18 ENCOUNTER — Telehealth: Payer: Self-pay

## 2018-12-18 ENCOUNTER — Ambulatory Visit (INDEPENDENT_AMBULATORY_CARE_PROVIDER_SITE_OTHER): Payer: Medicare HMO | Admitting: Endocrinology

## 2018-12-18 VITALS — BP 134/66 | HR 86 | Temp 98.2°F | Ht 61.0 in | Wt 159.2 lb

## 2018-12-18 DIAGNOSIS — E1143 Type 2 diabetes mellitus with diabetic autonomic (poly)neuropathy: Secondary | ICD-10-CM | POA: Diagnosis not present

## 2018-12-18 DIAGNOSIS — E1065 Type 1 diabetes mellitus with hyperglycemia: Secondary | ICD-10-CM | POA: Diagnosis not present

## 2018-12-18 DIAGNOSIS — K3184 Gastroparesis: Secondary | ICD-10-CM

## 2018-12-18 MED ORDER — PREGABALIN 150 MG PO CAPS: 150.0000 mg | ORAL_CAPSULE | Freq: Two times a day (BID) | ORAL | 1 refills | Status: DC

## 2018-12-18 MED ORDER — TRESIBA FLEXTOUCH 100 UNIT/ML ~~LOC~~ SOPN: 15.0000 [IU] | PEN_INJECTOR | Freq: Every day | SUBCUTANEOUS | 1 refills | Status: DC

## 2018-12-18 MED ORDER — ACCU-CHEK AVIVA PLUS VI STRP: ORAL_STRIP | 0 refills | Status: DC

## 2018-12-18 NOTE — Patient Instructions (Addendum)
Change Lantus to supper  Check blood sugars on waking up 7 days a week and before meals  Also check blood sugars about 2 hours after meals and do this after different meals by rotation  Recommended blood sugar levels on waking up are 90-130 and about 2 hours after meal is 130-160  Please bring your blood sugar monitor to each visit, thank you  Diet Consult

## 2018-12-18 NOTE — Progress Notes (Signed)
Patient ID: [name], female   DOB: 14-Nov-1965, 53 y.o.   MRN: [id number]           Reason for Appointment : Follow-up for Type 1 Diabetes  History of Present Illness   Referring HCP: Dr. Margarita Rana          Diagnosis: Type 1 diabetes mellitus, date of diagnosis: 1977         Previous history:  She has mostly been followed by primary care physicians for her diabetes with consistently poor control She has had periodic episodes of ketoacidosis  Recent history:   INSULIN regimen: BASAL insulin: LANTUS 15 units in a.m.  MEALTIME insulin: Novolog variable doses: Usually 6-8 at breakfast, 2-3 at lunch and dinner  A1c is now higher at 9.8  Current management, blood sugar patterns and problems identified:    She did not bring her monitor for download  She was in the hospital for 2 days with ketoacidosis resulting from her cutting back on her insulin 50% when she was having increased symptoms of gastroparesis  Although she has been instructed to adjust her insulin based on her carbohydrate intake she still takes about the same dose of NovoLog but relatively more in the morning  She states that her sugars are mostly higher fasting ranging from 170-220 although 145 today  Does not remember her other readings  Today despite eating only half a slice of toast and an egg her blood sugar is still over 200 postprandially in the office, was 145 this morning  Currently because of cost she is still taking NovoLog with insulin syringe  She is complaining of weight gain despite decreased intake overall   She was supposed to see the dietitian but she did not make the appointment after her last visit She has not been started on CGM since she has not had a history of checking her blood sugar 4 times a day  Glucose monitoring:  is being done 3 times a day         Glucometer: Accu-Chek       Blood Glucose readings from previous download:   PRE-MEAL Fasting Lunch Dinner Bedtime Overall   Glucose range:  67-197  79-402  117-222 ?   Mean/median:     ?    Hypoglycemia:  Occasional Symptoms of hypoglycemia: Weakness, sometimes none Treatment of hypoglycemia: Snacks         Self-care: The diet that the patient has been following is: None  Mealtimes are: Breakfast egg, cheese, sausage, occasionally grits, toast;   Lunch: Sandwich or only snacks, dinner: baked chricken, rice, potatoes                Dietician consultation: Most recent: years ago.         CDE consultation: 04/2018  Wt Readings from Last 3 Encounters:  12/18/18 159 lb 3.2 oz (72.2 kg)  12/15/18 152 lb (68.9 kg)  11/26/18 152 lb (68.9 kg)   Diabetes labs:  Lab Results  Component Value Date   HGBA1C 9.8 (H) 12/15/2018   HGBA1C 8.1 (A) 04/28/2018   HGBA1C 8.2 (H) 02/19/2018   Lab Results  Component Value Date   MICROALBUR 14.3 (H) 04/28/2018   LDLCALC 54 01/07/2018   CREATININE 1.22 (H) 12/17/2018    Lab Results  Component Value Date   MICRALBCREAT 17.1 04/28/2018   MICRALBCREAT 254 (H) 09/04/2016     Allergies as of 12/18/2018      Reactions   Anesthetics, Amide Nausea And Vomiting  Penicillins Diarrhea, Nausea And Vomiting, Other (See Comments)   Has patient had a PCN reaction causing immediate rash, facial/tongue/throat swelling, SOB or lightheadedness with hypotension: Yes Has patient had a PCN reaction causing severe rash involving mucus membranes or skin necrosis: No Has patient had a PCN reaction that required hospitalization No Has patient had a PCN reaction occurring within the last 10 years: Yes  If all of the above answers are "NO", then may proceed with Cephalosporin use.   Buprenorphine Hcl Rash   Encainide Nausea And Vomiting   Metoclopramide Other (See Comments)   Dystonia, muscle rigidity.  Patient states she is only allergic to the "pill form, not the IV"      Medication List       Accurate as of December 18, 2018  2:38 PM. If you have any questions, ask your nurse or  doctor.        ammonium lactate 12 % cream Commonly known as: AMLACTIN Apply topically as needed for dry skin.   aspirin EC 81 MG tablet Take 1 tablet (81 mg total) by mouth daily.   busPIRone 10 MG tablet Commonly known as: BUSPAR Take 1 tablet (10 mg total) by mouth 3 (three) times daily.   diphenhydrAMINE 25 mg capsule Commonly known as: BENADRYL Take 25 mg by mouth every 6 (six) hours as needed for allergies.   docusate sodium 100 MG capsule Commonly known as: Colace Take 1 capsule (100 mg total) by mouth daily as needed for mild constipation.   furosemide 40 MG tablet Commonly known as: LASIX Take 1 tablet (40 mg total) by mouth daily as needed for fluid or edema.   glucose blood test strip Commonly known as: Accu-Chek Aviva Plus Use as instructed to test blood sugar 5 times daily Dx E10.65   insulin aspart 100 UNIT/ML injection Commonly known as: novoLOG Inject 0-12 Units into the skin 3 (three) times daily before meals. As per sliding scale   Insulin Glargine 100 UNIT/ML Solostar Pen Commonly known as: Lantus SoloStar Inject 15 units under the skin once daily in the morning.   Insulin Syringe-Needle U-100 31G X 5/16" 0.5 ML Misc Commonly known as: BD Insulin Syringe Ultrafine 1 each by Does not apply route 4 (four) times daily.   loperamide 2 MG capsule Commonly known as: IMODIUM Take 1 capsule (2 mg total) by mouth as needed for diarrhea or loose stools.   losartan 25 MG tablet Commonly known as: COZAAR Take 1 tablet (25 mg total) by mouth daily.   medroxyPROGESTERone 10 MG tablet Commonly known as: PROVERA 1 tablet po BID until bleeding stops, then go down to one tablet a day   metoprolol tartrate 25 MG tablet Commonly known as: LOPRESSOR Take 0.5 tablets (12.5 mg total) by mouth 2 (two) times daily.   OneTouch Delica Plus [id number] Misc 1 Units by Does not apply route as directed.   OneTouch Verio w/Device Kit Use as directed   pantoprazole  40 MG tablet Commonly known as: PROTONIX Take 1 tablet (40 mg total) by mouth daily.   potassium chloride 10 MEQ tablet Commonly known as: K-DUR Take 1 tablet (10 mEq total) by mouth daily.   pregabalin 150 MG capsule Commonly known as: Lyrica Take 1 capsule (150 mg total) by mouth 2 (two) times daily.   promethazine 12.5 MG tablet Commonly known as: PHENERGAN Take 1 tablet (12.5 mg total) by mouth every 8 (eight) hours as needed for up to 30 doses for nausea or vomiting.  pyridostigmine 60 MG tablet Commonly known as: MESTINON Take 60 mg by mouth 3 (three) times daily.       Allergies:  Allergies  Allergen Reactions  . Anesthetics, Amide Nausea And Vomiting  . Penicillins Diarrhea, Nausea And Vomiting and Other (See Comments)    Has patient had a PCN reaction causing immediate rash, facial/tongue/throat swelling, SOB or lightheadedness with hypotension: Yes Has patient had a PCN reaction causing severe rash involving mucus membranes or skin necrosis: No Has patient had a PCN reaction that required hospitalization No Has patient had a PCN reaction occurring within the last 10 years: Yes  If all of the above answers are "NO", then may proceed with Cephalosporin use.   . Buprenorphine Hcl Rash  . Encainide Nausea And Vomiting  . Metoclopramide Other (See Comments)    Dystonia, muscle rigidity.  Patient states she is only allergic to the "pill form, not the IV"    Past Medical History:  Diagnosis Date  . AKI (acute kidney injury) ([place])   . Anemia, iron deficiency   . Anxiety and depression 05/18/2015  . CAD (coronary artery disease), native coronary artery 01/11/2018   Non-STEMI with severe three-vessel disease noted July 2019  . Depression   . Diabetes mellitus without complication ([place])   . Diabetic gastroparesis ([place])   . Diabetic neuropathy, type I diabetes mellitus ([place]) 05/18/2015  . DKA (diabetic ketoacidoses) ([place]) 06/16/2017  . DKA, type 1 ([place]) 03/18/2016   . Essential hypertension   . Gastroparesis   . GERD (gastroesophageal reflux disease)   . HLD (hyperlipidemia)   . MDD (major depressive disorder), single episode, severe , no psychosis ([place])   . Non-ST elevation (NSTEMI) myocardial infarction ([place])   . S/P CABG x 3 01/14/2018  . Tardive dyskinesia   . Vitamin B12 deficiency 08/16/2015    Past Surgical History:  Procedure Laterality Date  . CARDIAC SURGERY    . COLONOSCOPY  09/27/2014   at [facility]  . CORONARY ARTERY BYPASS GRAFT N/A 01/14/2018   Procedure: CORONARY ARTERY BYPASS GRAFTING (CABG)X3, RIGHT AND LEFT SAPHENOUS VEIN HARVEST, MAMMARY TAKE DOWN. MAMMARY TO LAD, SVG TO PD, SVG TO DISTAL CIRC.;  Surgeon: Grace Isaac, MD;  Location: [place];  Service: Open Heart Surgery;  Laterality: N/A;  . ESOPHAGOGASTRODUODENOSCOPY  09/27/2014   at [facility], Dr Rolan Lipa. biospy neg for celiac, neg for H pylori.   . EYE SURGERY    . gailstones    . IR FLUORO GUIDE CV LINE RIGHT  02/01/2017  . IR FLUORO GUIDE CV LINE RIGHT  03/06/2017  . IR FLUORO GUIDE CV LINE RIGHT  03/25/2017  . IR GENERIC HISTORICAL  01/24/2016   IR FLUORO GUIDE CV LINE RIGHT 01/24/2016 Darrell K Allred, PA-C [facility]  . IR GENERIC HISTORICAL  01/24/2016   IR US GUIDE VASC ACCESS RIGHT 01/24/2016 Darrell K Allred, PA-C [facility]  . IR US GUIDE VASC ACCESS RIGHT  02/01/2017  . IR US GUIDE VASC ACCESS RIGHT  03/06/2017  . IR US GUIDE VASC ACCESS RIGHT  03/25/2017  . LEFT HEART CATH AND CORONARY ANGIOGRAPHY N/A 01/07/2018   Procedure: LEFT HEART CATH AND CORONARY ANGIOGRAPHY;  Surgeon: Troy Sine, MD;  Location: [place] CV [facility];  Service: Cardiovascular;  Laterality: N/A;  . POSTERIOR VITRECTOMY AND MEMBRANE PEEL-LEFT EYE  09/28/2002  . POSTERIOR VITRECTOMY AND MEMBRANE PEEL-RIGHT EYE  03/16/2002  . RETINAL DETACHMENT SURGERY    . TEE WITHOUT CARDIOVERSION N/A 01/14/2018  Procedure: TRANSESOPHAGEAL ECHOCARDIOGRAM (TEE);  Surgeon: Grace Isaac, MD;  Location: [place];  Service: Open Heart Surgery;  Laterality: N/A;    Family History  Problem Relation Age of Onset  . Cystic fibrosis Mother   . Hypertension Father   . Diabetes Brother   . Hypertension Maternal Grandmother     Social History:  reports that she has never smoked. She has never used smokeless tobacco. She reports current drug use. Drug: Marijuana. She reports that she does not drink alcohol.      Review of Systems      Lipids: Taking atorvastatin for history of CAD  Lab Results  Component Value Date   CHOL 121 01/07/2018   HDL 47 01/07/2018   LDLCALC 54 01/07/2018   TRIG 101 01/07/2018   CHOLHDL 2.6 01/07/2018    Last dilated eye exam was in 0/17  DIABETES COMPLICATIONS: Gastroparesis treated with Phenergan as needed She was told to try Mestinon by a gastroenterologist in [place] but she does not think this was useful and is currently not able to follow-up because of the distance She said that local gastroenterologist has nothing to offer   On Lyrica for peripheral neuropathy and is asking for refill    Physical Examination:  BP 134/66 (BP Location: Left Arm, Patient Position: Sitting, Cuff Size: Normal)   Pulse 86   Temp 98.2 F (36.8 C) (Oral)   Ht 5' 1"  (1.549 m)   Wt 159 lb 3.2 oz (72.2 kg)   LMP 11/19/2018 (Within Days)   SpO2 99%   BMI 30.08 kg/m       ASSESSMENT:  Diabetes type 1, long-standing with multiple complications  See history of present illness for detailed discussion of current diabetes management, blood sugar patterns and problems identified  Recent problems identified  She has worsening of her A1c and is now 9.8  Recent episode of gastroparesis from her cutting back excessively on insulin, do not understand that with stress she is still needs significant amount of insulin and not cut back excessively when she is eating less  May not be getting 24 action of Lantus with higher fasting readings  Blood sugar variability is worse  because of her gastroparesis  Likely has inadequate postprandial control and unclear why her sugar is high today even with minimal carbohydrate intake and taking 6 units of NovoLog  Needs more diabetes education especially meal planning  Is a good candidate for insulin pump but first needs considerable education and consistent compliance and follow-up    PLAN:   1. Glucose monitoring: . She was advised to check readings before each meal consistently and also 2 hours after 1 of her meals . Again discussed use of the freestyle libre sensor if she is able to check 4 times a day    2.  Diabetes education: . Patient will need carbohydrate counting advice and also meal planning advice especially to help with a gastroparesis diet  3.  Lifestyle changes: . Encouraged walking for exercise   4.  Insulin changes prescribed:  She will take Lantus in the evening for now  However will try to see if Tyler Aas is covered by her insurance and she should have better 24-hour control as well as less variability and hypoglycemia with switching  She can take NovoLog right after finishing her meal and continue 6 to 8 units in the morning and adjust lunch and dinner doses based on amount of carbohydrate  Likely needs a unit for every 10  g of carbohydrate  Need short-term follow-up  Need to set up consultation with nutritionist, referral made  She will need to switch to a NovoLog pen once she finishes her vials and she will need to call  She will not skip her NovoLog at any meal  Continue 15 units of Lantus but consider splitting the dose to twice a day if she is not able to get [place]  Follow-up eye exam to be scheduled  Recommended at least following up with her local gastroenterologist for gastroparesis Consider trial of erythromycin or less well-known treatment for gastroparesis  Prescription for Lyrica sent  There are no Patient Instructions on file for this visit.  Counseling time  on subjects discussed in assessment and plan sections is over 50% of today's 25 minute visit  Elayne Snare 12/18/2018, 2:38 PM     Note: This note was prepared with Dragon voice recognition system technology. Any transcriptional errors that result from this process are unintentional.

## 2018-12-18 NOTE — Telephone Encounter (Signed)
Transition Care Management Follow-up Telephone Call Date of discharge and from where: 12/17/2018, [facility]  Call placed to patient # [phone], message left requesting a call back to this CM # [phone]

## 2018-12-19 ENCOUNTER — Ambulatory Visit (HOSPITAL_COMMUNITY): Payer: Self-pay

## 2018-12-19 ENCOUNTER — Telehealth: Payer: Self-pay

## 2018-12-19 NOTE — Telephone Encounter (Signed)
Transition Care Management Follow-up Telephone Call #2  Date of discharge and from where: 12/17/2018, [facility] .  Call placed to # [phone], message left with call back requested to this CM # [phone].

## 2018-12-22 ENCOUNTER — Other Ambulatory Visit: Payer: Self-pay

## 2018-12-22 ENCOUNTER — Encounter (HOSPITAL_COMMUNITY): Payer: Self-pay | Admitting: Emergency Medicine

## 2018-12-22 ENCOUNTER — Inpatient Hospital Stay (HOSPITAL_COMMUNITY)
Admission: EM | Admit: 2018-12-22 | Discharge: 2018-12-25 | DRG: 637 | Disposition: A | Discharge status: Home / Self Care | Payer: Medicare HMO | Attending: Internal Medicine | Admitting: Internal Medicine

## 2018-12-22 ENCOUNTER — Ambulatory Visit (HOSPITAL_COMMUNITY): Payer: Self-pay

## 2018-12-22 ENCOUNTER — Emergency Department (HOSPITAL_COMMUNITY): Payer: Medicare HMO

## 2018-12-22 DIAGNOSIS — Z1159 Encounter for screening for other viral diseases: Secondary | ICD-10-CM | POA: Diagnosis not present

## 2018-12-22 DIAGNOSIS — I10 Essential (primary) hypertension: Secondary | ICD-10-CM | POA: Diagnosis present

## 2018-12-22 DIAGNOSIS — E785 Hyperlipidemia, unspecified: Secondary | ICD-10-CM | POA: Diagnosis present

## 2018-12-22 DIAGNOSIS — F418 Other specified anxiety disorders: Secondary | ICD-10-CM

## 2018-12-22 DIAGNOSIS — Z951 Presence of aortocoronary bypass graft: Secondary | ICD-10-CM

## 2018-12-22 DIAGNOSIS — I2510 Atherosclerotic heart disease of native coronary artery without angina pectoris: Secondary | ICD-10-CM | POA: Diagnosis present

## 2018-12-22 DIAGNOSIS — Z79899 Other long term (current) drug therapy: Secondary | ICD-10-CM | POA: Diagnosis not present

## 2018-12-22 DIAGNOSIS — G2401 Drug induced subacute dyskinesia: Secondary | ICD-10-CM

## 2018-12-22 DIAGNOSIS — I252 Old myocardial infarction: Secondary | ICD-10-CM

## 2018-12-22 DIAGNOSIS — N390 Urinary tract infection, site not specified: Secondary | ICD-10-CM

## 2018-12-22 DIAGNOSIS — Z8349 Family history of other endocrine, nutritional and metabolic diseases: Secondary | ICD-10-CM | POA: Diagnosis not present

## 2018-12-22 DIAGNOSIS — R112 Nausea with vomiting, unspecified: Secondary | ICD-10-CM

## 2018-12-22 DIAGNOSIS — E872 Acidosis: Secondary | ICD-10-CM

## 2018-12-22 DIAGNOSIS — R6510 Systemic inflammatory response syndrome (SIRS) of non-infectious origin without acute organ dysfunction: Secondary | ICD-10-CM | POA: Diagnosis not present

## 2018-12-22 DIAGNOSIS — K219 Gastro-esophageal reflux disease without esophagitis: Secondary | ICD-10-CM | POA: Diagnosis present

## 2018-12-22 DIAGNOSIS — R000 Tachycardia, unspecified: Secondary | ICD-10-CM | POA: Diagnosis present

## 2018-12-22 DIAGNOSIS — E1010 Type 1 diabetes mellitus with ketoacidosis without coma: Secondary | ICD-10-CM | POA: Diagnosis present

## 2018-12-22 DIAGNOSIS — Z8249 Family history of ischemic heart disease and other diseases of the circulatory system: Secondary | ICD-10-CM | POA: Diagnosis not present

## 2018-12-22 DIAGNOSIS — E1043 Type 1 diabetes mellitus with diabetic autonomic (poly)neuropathy: Secondary | ICD-10-CM | POA: Diagnosis present

## 2018-12-22 DIAGNOSIS — E1143 Type 2 diabetes mellitus with diabetic autonomic (poly)neuropathy: Secondary | ICD-10-CM

## 2018-12-22 DIAGNOSIS — G9340 Encephalopathy, unspecified: Secondary | ICD-10-CM | POA: Diagnosis not present

## 2018-12-22 DIAGNOSIS — Z794 Long term (current) use of insulin: Secondary | ICD-10-CM | POA: Diagnosis not present

## 2018-12-22 DIAGNOSIS — E1069 Type 1 diabetes mellitus with other specified complication: Secondary | ICD-10-CM

## 2018-12-22 DIAGNOSIS — R197 Diarrhea, unspecified: Secondary | ICD-10-CM

## 2018-12-22 DIAGNOSIS — K3184 Gastroparesis: Secondary | ICD-10-CM

## 2018-12-22 DIAGNOSIS — N179 Acute kidney failure, unspecified: Secondary | ICD-10-CM | POA: Diagnosis present

## 2018-12-22 DIAGNOSIS — G9341 Metabolic encephalopathy: Secondary | ICD-10-CM | POA: Diagnosis present

## 2018-12-22 DIAGNOSIS — A419 Sepsis, unspecified organism: Secondary | ICD-10-CM

## 2018-12-22 DIAGNOSIS — T450X5A Adverse effect of antiallergic and antiemetic drugs, initial encounter: Secondary | ICD-10-CM | POA: Diagnosis present

## 2018-12-22 DIAGNOSIS — E1110 Type 2 diabetes mellitus with ketoacidosis without coma: Secondary | ICD-10-CM | POA: Diagnosis present

## 2018-12-22 LAB — CBC WITH DIFFERENTIAL/PLATELET
Abs Immature Granulocytes: 0.04 10*3/uL (ref 0.00–0.07)
Basophils Absolute: 0 10*3/uL (ref 0.0–0.1)
Basophils Relative: 0 %
Eosinophils Absolute: 0 10*3/uL (ref 0.0–0.5)
Eosinophils Relative: 0 %
HCT: 34.4 % — ABNORMAL LOW (ref 36.0–46.0)
Hemoglobin: 10.5 g/dL — ABNORMAL LOW (ref 12.0–15.0)
Immature Granulocytes: 0 %
Lymphocytes Relative: 6 %
Lymphs Abs: 0.7 10*3/uL (ref 0.7–4.0)
MCH: 27.9 pg (ref 26.0–34.0)
MCHC: 30.5 g/dL (ref 30.0–36.0)
MCV: 91.5 fL (ref 80.0–100.0)
Monocytes Absolute: 0.5 10*3/uL (ref 0.1–1.0)
Monocytes Relative: 4 %
Neutro Abs: 9.4 10*3/uL — ABNORMAL HIGH (ref 1.7–7.7)
Neutrophils Relative %: 90 %
Platelets: 321 10*3/uL (ref 150–400)
RBC: 3.76 MIL/uL — ABNORMAL LOW (ref 3.87–5.11)
RDW: 13.4 % (ref 11.5–15.5)
WBC: 10.6 10*3/uL — ABNORMAL HIGH (ref 4.0–10.5)
nRBC: 0 % (ref 0.0–0.2)

## 2018-12-22 LAB — POCT I-STAT EG7
Acid-base deficit: 11 mmol/L — ABNORMAL HIGH (ref 0.0–2.0)
Acid-base deficit: 12 mmol/L — ABNORMAL HIGH (ref 0.0–2.0)
Bicarbonate: 12.8 mmol/L — ABNORMAL LOW (ref 20.0–28.0)
Bicarbonate: 13.5 mmol/L — ABNORMAL LOW (ref 20.0–28.0)
Calcium, Ion: 1.14 mmol/L — ABNORMAL LOW (ref 1.15–1.40)
Calcium, Ion: 1.23 mmol/L (ref 1.15–1.40)
HCT: 31 % — ABNORMAL LOW (ref 36.0–46.0)
HCT: 32 % — ABNORMAL LOW (ref 36.0–46.0)
Hemoglobin: 10.5 g/dL — ABNORMAL LOW (ref 12.0–15.0)
Hemoglobin: 10.9 g/dL — ABNORMAL LOW (ref 12.0–15.0)
O2 Saturation: 90 %
O2 Saturation: 99 %
Potassium: 5 mmol/L (ref 3.5–5.1)
Potassium: 5.1 mmol/L (ref 3.5–5.1)
Sodium: 136 mmol/L (ref 135–145)
Sodium: 137 mmol/L (ref 135–145)
TCO2: 13 mmol/L — ABNORMAL LOW (ref 22–32)
TCO2: 14 mmol/L — ABNORMAL LOW (ref 22–32)
pCO2, Ven: 24.2 mmHg — ABNORMAL LOW (ref 44.0–60.0)
pCO2, Ven: 25.5 mmHg — ABNORMAL LOW (ref 44.0–60.0)
pH, Ven: 7.33 (ref 7.250–7.430)
pH, Ven: 7.331 (ref 7.250–7.430)
pO2, Ven: 125 mmHg — ABNORMAL HIGH (ref 32.0–45.0)
pO2, Ven: 61 mmHg — ABNORMAL HIGH (ref 32.0–45.0)

## 2018-12-22 LAB — GLUCOSE, CAPILLARY
Glucose-Capillary: 289 mg/dL — ABNORMAL HIGH (ref 70–99)
Glucose-Capillary: 219 mg/dL — ABNORMAL HIGH (ref 70–99)
Glucose-Capillary: 262 mg/dL — ABNORMAL HIGH (ref 70–99)
Glucose-Capillary: 214 mg/dL — ABNORMAL HIGH (ref 70–99)
Glucose-Capillary: 177 mg/dL — ABNORMAL HIGH (ref 70–99)
Glucose-Capillary: 152 mg/dL — ABNORMAL HIGH (ref 70–99)
Glucose-Capillary: 129 mg/dL — ABNORMAL HIGH (ref 70–99)
Glucose-Capillary: 114 mg/dL — ABNORMAL HIGH (ref 70–99)
Glucose-Capillary: 115 mg/dL — ABNORMAL HIGH (ref 70–99)
Glucose-Capillary: 166 mg/dL — ABNORMAL HIGH (ref 70–99)
Glucose-Capillary: 180 mg/dL — ABNORMAL HIGH (ref 70–99)
Glucose-Capillary: 201 mg/dL — ABNORMAL HIGH (ref 70–99)
Glucose-Capillary: 145 mg/dL — ABNORMAL HIGH (ref 70–99)
Glucose-Capillary: 124 mg/dL — ABNORMAL HIGH (ref 70–99)

## 2018-12-22 LAB — BASIC METABOLIC PANEL
Anion gap: 9 (ref 5–15)
Anion gap: 9 (ref 5–15)
BUN: 11 mg/dL (ref 6–20)
BUN: 9 mg/dL (ref 6–20)
CO2: 21 mmol/L — ABNORMAL LOW (ref 22–32)
CO2: 21 mmol/L — ABNORMAL LOW (ref 22–32)
Calcium: 9 mg/dL (ref 8.9–10.3)
Calcium: 8.6 mg/dL — ABNORMAL LOW (ref 8.9–10.3)
Chloride: 111 mmol/L (ref 98–111)
Chloride: 107 mmol/L (ref 98–111)
Creatinine, Ser: 1.19 mg/dL — ABNORMAL HIGH (ref 0.44–1.00)
Creatinine, Ser: 1.13 mg/dL — ABNORMAL HIGH (ref 0.44–1.00)
GFR calc Af Amer: >60 mL/min (ref >60)
GFR calc Af Amer: >60 mL/min (ref >60)
GFR calc non Af Amer: 53 mL/min — ABNORMAL LOW (ref >60)
GFR calc non Af Amer: 56 mL/min — ABNORMAL LOW (ref >60)
Glucose, Bld: 171 mg/dL — ABNORMAL HIGH (ref 70–99)
Glucose, Bld: 146 mg/dL — ABNORMAL HIGH (ref 70–99)
Potassium: 3.8 mmol/L (ref 3.5–5.1)
Potassium: 3.2 mmol/L — ABNORMAL LOW (ref 3.5–5.1)
Sodium: 141 mmol/L (ref 135–145)
Sodium: 137 mmol/L (ref 135–145)

## 2018-12-22 LAB — CBG MONITORING, ED
Glucose-Capillary: 396 mg/dL — ABNORMAL HIGH (ref 70–99)
Glucose-Capillary: 409 mg/dL — ABNORMAL HIGH (ref 70–99)

## 2018-12-22 LAB — URINALYSIS, ROUTINE W REFLEX MICROSCOPIC
Bilirubin Urine: NEGATIVE
Glucose, UA: >=500 mg/dL — AB
Ketones, ur: 20 mg/dL — AB
Leukocytes,Ua: NEGATIVE
Nitrite: NEGATIVE
Protein, ur: 30 mg/dL — AB
Specific Gravity, Urine: 1.012 (ref 1.005–1.030)
pH: 5 (ref 5.0–8.0)

## 2018-12-22 LAB — TROPONIN I (HIGH SENSITIVITY): Troponin I (High Sensitivity): 7 ng/L (ref <18)

## 2018-12-22 LAB — LACTIC ACID, PLASMA
Lactic Acid, Venous: 2.9 mmol/L (ref 0.5–1.9)
Lactic Acid, Venous: 3.3 mmol/L (ref 0.5–1.9)
Lactic Acid, Venous: 1.2 mmol/L (ref 0.5–1.9)
Lactic Acid, Venous: 1.6 mmol/L (ref 0.5–1.9)

## 2018-12-22 LAB — LIPASE, BLOOD: Lipase: 21 U/L (ref 11–51)

## 2018-12-22 LAB — COMPREHENSIVE METABOLIC PANEL
ALT: 18 U/L (ref 0–44)
AST: 17 U/L (ref 15–41)
Albumin: 3.6 g/dL (ref 3.5–5.0)
Alkaline Phosphatase: 81 U/L (ref 38–126)
Anion gap: 19 — ABNORMAL HIGH (ref 5–15)
BUN: 16 mg/dL (ref 6–20)
CO2: 11 mmol/L — ABNORMAL LOW (ref 22–32)
Calcium: 9.1 mg/dL (ref 8.9–10.3)
Chloride: 106 mmol/L (ref 98–111)
Creatinine, Ser: 1.58 mg/dL — ABNORMAL HIGH (ref 0.44–1.00)
GFR calc Af Amer: 43 mL/min — ABNORMAL LOW (ref >60)
GFR calc non Af Amer: 37 mL/min — ABNORMAL LOW (ref >60)
Glucose, Bld: 457 mg/dL — ABNORMAL HIGH (ref 70–99)
Potassium: 5.1 mmol/L (ref 3.5–5.1)
Sodium: 136 mmol/L (ref 135–145)
Total Bilirubin: 1.7 mg/dL — ABNORMAL HIGH (ref 0.3–1.2)
Total Protein: 6.9 g/dL (ref 6.5–8.1)

## 2018-12-22 LAB — MAGNESIUM
Magnesium: 2 mg/dL (ref 1.7–2.4)
Magnesium: 1.8 mg/dL (ref 1.7–2.4)

## 2018-12-22 MED ORDER — SODIUM CHLORIDE 0.9 % IV SOLN
1.0000 g | INTRAVENOUS | Status: DC
  Administered 2018-12-22 – 2018-12-23 (×2): 1 g via INTRAVENOUS
  Filled 2018-12-22 (×2): qty 10

## 2018-12-22 MED ORDER — SODIUM CHLORIDE 0.9 % IV SOLN: INTRAVENOUS | Status: DC

## 2018-12-22 MED ORDER — DEXTROSE-NACL 5-0.45 % IV SOLN
INTRAVENOUS | Status: DC
  Administered 2018-12-22: 05:00:00 via INTRAVENOUS

## 2018-12-22 MED ORDER — INSULIN ASPART 100 UNIT/ML ~~LOC~~ SOLN
0.0000 [IU] | Freq: Every day | SUBCUTANEOUS | Status: DC
  Administered 2018-12-24: 2 [IU] via SUBCUTANEOUS

## 2018-12-22 MED ORDER — LACTATED RINGERS BOLUS PEDS: 2000.0000 mL | Freq: Once | INTRAVENOUS | Status: DC

## 2018-12-22 MED ORDER — POTASSIUM CHLORIDE IN NACL 20-0.9 MEQ/L-% IV SOLN
INTRAVENOUS | Status: DC
  Administered 2018-12-22 – 2018-12-25 (×7): via INTRAVENOUS
  Filled 2018-12-22 (×9): qty 1000

## 2018-12-22 MED ORDER — INSULIN REGULAR(HUMAN) IN NACL 100-0.9 UT/100ML-% IV SOLN
INTRAVENOUS | Status: DC
  Administered 2018-12-22: 4.6 [IU]/h via INTRAVENOUS

## 2018-12-22 MED ORDER — BUSPIRONE HCL 5 MG PO TABS
10.0000 mg | ORAL_TABLET | Freq: Three times a day (TID) | ORAL | Status: DC
  Administered 2018-12-22 – 2018-12-25 (×9): 10 mg via ORAL
  Filled 2018-12-22 (×9): qty 2

## 2018-12-22 MED ORDER — ONDANSETRON HCL 4 MG/2ML IJ SOLN
INTRAMUSCULAR | Status: AC
  Filled 2018-12-22: qty 2

## 2018-12-22 MED ORDER — PROMETHAZINE HCL 25 MG/ML IJ SOLN
25.0000 mg | Freq: Three times a day (TID) | INTRAMUSCULAR | Status: DC | PRN
  Administered 2018-12-22: 25 mg via INTRAVENOUS
  Filled 2018-12-22: qty 1

## 2018-12-22 MED ORDER — INSULIN GLARGINE 100 UNIT/ML ~~LOC~~ SOLN
15.0000 [IU] | Freq: Once | SUBCUTANEOUS | Status: DC
  Filled 2018-12-22: qty 0.15

## 2018-12-22 MED ORDER — LACTATED RINGERS IV BOLUS: 500.0000 mL | Freq: Once | INTRAVENOUS | Status: DC

## 2018-12-22 MED ORDER — SODIUM CHLORIDE 0.9 % IV SOLN
8.0000 mg | Freq: Three times a day (TID) | INTRAVENOUS | Status: DC | PRN
  Administered 2018-12-22: 8 mg via INTRAVENOUS
  Filled 2018-12-22: qty 4

## 2018-12-22 MED ORDER — PANTOPRAZOLE SODIUM 40 MG PO TBEC
40.0000 mg | DELAYED_RELEASE_TABLET | Freq: Every day | ORAL | Status: DC
  Administered 2018-12-22 – 2018-12-25 (×4): 40 mg via ORAL
  Filled 2018-12-22 (×4): qty 1

## 2018-12-22 MED ORDER — ENOXAPARIN SODIUM 40 MG/0.4ML ~~LOC~~ SOLN
40.0000 mg | SUBCUTANEOUS | Status: DC
  Administered 2018-12-22 – 2018-12-24 (×3): 40 mg via SUBCUTANEOUS
  Filled 2018-12-22 (×4): qty 0.4

## 2018-12-22 MED ORDER — PROMETHAZINE HCL 25 MG/ML IJ SOLN
25.0000 mg | Freq: Once | INTRAMUSCULAR | Status: AC
  Administered 2018-12-22: 25 mg via INTRAVENOUS
  Filled 2018-12-22: qty 1

## 2018-12-22 MED ORDER — FENTANYL CITRATE (PF) 100 MCG/2ML IJ SOLN
50.0000 ug | Freq: Once | INTRAMUSCULAR | Status: AC
  Administered 2018-12-22: 50 ug via INTRAVENOUS

## 2018-12-22 MED ORDER — INSULIN REGULAR BOLUS VIA INFUSION
0.0000 [IU] | Freq: Three times a day (TID) | INTRAVENOUS | Status: DC
  Filled 2018-12-22: qty 10

## 2018-12-22 MED ORDER — FENTANYL CITRATE (PF) 100 MCG/2ML IJ SOLN
INTRAMUSCULAR | Status: AC
  Filled 2018-12-22: qty 2

## 2018-12-22 MED ORDER — PYRIDOSTIGMINE BROMIDE 60 MG PO TABS
60.0000 mg | ORAL_TABLET | Freq: Three times a day (TID) | ORAL | Status: DC
  Administered 2018-12-22 – 2018-12-24 (×6): 60 mg via ORAL
  Filled 2018-12-22 (×13): qty 1

## 2018-12-22 MED ORDER — LACTATED RINGERS IV BOLUS
1000.0000 mL | Freq: Once | INTRAVENOUS | Status: AC
  Administered 2018-12-22: 1000 mL via INTRAVENOUS

## 2018-12-22 MED ORDER — DEXTROSE 50 % IV SOLN: 25.0000 mL | INTRAVENOUS | Status: DC | PRN

## 2018-12-22 MED ORDER — POTASSIUM CHLORIDE 10 MEQ/100ML IV SOLN
10.0000 meq | INTRAVENOUS | Status: AC
  Administered 2018-12-22 – 2018-12-23 (×4): 10 meq via INTRAVENOUS
  Filled 2018-12-22 (×4): qty 100

## 2018-12-22 MED ORDER — INSULIN ASPART 100 UNIT/ML ~~LOC~~ SOLN
0.0000 [IU] | Freq: Three times a day (TID) | SUBCUTANEOUS | Status: DC
  Administered 2018-12-22: 4 [IU] via SUBCUTANEOUS
  Administered 2018-12-23: 11 [IU] via SUBCUTANEOUS
  Administered 2018-12-23: 3 [IU] via SUBCUTANEOUS
  Administered 2018-12-24: 7 [IU] via SUBCUTANEOUS
  Administered 2018-12-24: 3 [IU] via SUBCUTANEOUS

## 2018-12-22 MED ORDER — PREGABALIN 75 MG PO CAPS
150.0000 mg | ORAL_CAPSULE | Freq: Two times a day (BID) | ORAL | Status: DC
  Administered 2018-12-22 – 2018-12-25 (×6): 150 mg via ORAL
  Filled 2018-12-22 (×6): qty 2

## 2018-12-22 MED ORDER — DEXTROSE-NACL 5-0.45 % IV SOLN: INTRAVENOUS | Status: DC

## 2018-12-22 MED ORDER — LACTATED RINGERS IV BOLUS
2000.0000 mL | Freq: Once | INTRAVENOUS | Status: AC
  Administered 2018-12-22: 2000 mL via INTRAVENOUS

## 2018-12-22 MED ORDER — INSULIN REGULAR(HUMAN) IN NACL 100-0.9 UT/100ML-% IV SOLN
INTRAVENOUS | Status: DC
  Administered 2018-12-22: 3.5 [IU]/h via INTRAVENOUS
  Filled 2018-12-22: qty 100

## 2018-12-22 MED ORDER — METOPROLOL TARTRATE 25 MG PO TABS
25.0000 mg | ORAL_TABLET | Freq: Two times a day (BID) | ORAL | Status: DC
  Administered 2018-12-22 – 2018-12-25 (×5): 25 mg via ORAL
  Filled 2018-12-22 (×5): qty 1

## 2018-12-22 MED ORDER — ONDANSETRON HCL 4 MG/2ML IJ SOLN
4.0000 mg | Freq: Once | INTRAMUSCULAR | Status: AC
  Administered 2018-12-22: 4 mg via INTRAVENOUS

## 2018-12-22 MED ORDER — METOPROLOL TARTRATE 12.5 MG HALF TABLET
12.5000 mg | ORAL_TABLET | Freq: Two times a day (BID) | ORAL | Status: DC
  Administered 2018-12-22: 12.5 mg via ORAL
  Filled 2018-12-22: qty 1

## 2018-12-22 MED ORDER — SODIUM CHLORIDE 0.9 % IV BOLUS: 1000.0000 mL | Freq: Once | INTRAVENOUS | Status: DC

## 2018-12-22 MED ORDER — ONDANSETRON HCL 4 MG/2ML IJ SOLN
4.0000 mg | Freq: Four times a day (QID) | INTRAMUSCULAR | Status: DC | PRN
  Administered 2018-12-22: 4 mg via INTRAVENOUS
  Filled 2018-12-22: qty 2

## 2018-12-22 MED ORDER — ASPIRIN EC 81 MG PO TBEC
81.0000 mg | DELAYED_RELEASE_TABLET | Freq: Every day | ORAL | Status: DC
  Administered 2018-12-22 – 2018-12-25 (×4): 81 mg via ORAL
  Filled 2018-12-22 (×4): qty 1

## 2018-12-22 NOTE — Progress Notes (Signed)
CRITICAL VALUE ALERT  Critical Value:  LACTIC 3.3  Date & Time Notied:  12/22/18 @ 0600  Provider Notified: Silas [place]  Orders Received/Actions taken: AWAITING RESPONSE

## 2018-12-22 NOTE — H&P (Signed)
History and Physical    [name] KPT:[id number] DOB: Dec 28, 1965 DOA: 12/22/2018  PCP: [name], MD  Patient coming from: Home.  Chief Complaint: Nausea vomiting diarrhea.  HPI: [name] is a 53 y.o. female with history of diabetes mellitus type 1 with tardive dyskinesia secondary Reglan as per the chart, CAD, depression and severe gastroparesis on M[place]on was brought to the ER after patient has been persistent nausea vomiting with diarrhea last 24 hours but has been also having some abdominal discomfort.  Patient had stated oncoming that she may not have been taking her medication as she should have been.  ED Course: In the ER patient was found to be persistently nauseous and vomiting.  Lab work show bicarb of 11 anion gap 19 creatinine 1.5 hemoglobin 10.5.  Platelets 321.  UA is pending.  Lactate was 2.9.  Since patient had difficult IV access central line was placed.  Chest x-ray does not show any pneumothorax or anything acute except for mild congestion.  EKG shows normal sinus rhythm.  Patient is being admitted for diabetic ketoacidosis with persistent nausea vomiting secondary to gastroparesis.  On exam patient abdomen appears benign.  Patient has tremors of the upper extremities and involuntary movements of the facial muscles and tongue from tardive dyskinesia.  Review of Systems: As per HPI, rest all negative.   Past Medical History:  Diagnosis Date  . AKI (acute kidney injury) ([place])   . Anemia, iron deficiency   . Anxiety and depression 05/18/2015  . CAD (coronary artery disease), native coronary artery 01/11/2018   Non-STEMI with severe three-vessel disease noted July 2019  . Depression   . Diabetes mellitus without complication ([place])   . Diabetic gastroparesis ([place])   . Diabetic neuropathy, type I diabetes mellitus ([place]) 05/18/2015  . DKA (diabetic ketoacidoses) ([place]) 06/16/2017  . DKA, type 1 ([place]) 03/18/2016  . Essential hypertension   . Gastroparesis   .  GERD (gastroesophageal reflux disease)   . HLD (hyperlipidemia)   . MDD (major depressive disorder), single episode, severe , no psychosis ([place])   . Non-ST elevation (NSTEMI) myocardial infarction ([place])   . S/P CABG x 3 01/14/2018  . Tardive dyskinesia   . Vitamin B12 deficiency 08/16/2015    Past Surgical History:  Procedure Laterality Date  . CARDIAC SURGERY    . COLONOSCOPY  09/27/2014   at [facility]  . CORONARY ARTERY BYPASS GRAFT N/A 01/14/2018   Procedure: CORONARY ARTERY BYPASS GRAFTING (CABG)X3, RIGHT AND LEFT SAPHENOUS VEIN HARVEST, MAMMARY TAKE DOWN. MAMMARY TO LAD, SVG TO PD, SVG TO DISTAL CIRC.;  Surgeon: Grace Isaac, MD;  Location: [place];  Service: Open Heart Surgery;  Laterality: N/A;  . ESOPHAGOGASTRODUODENOSCOPY  09/27/2014   at [facility], Dr Rolan Lipa. biospy neg for celiac, neg for H pylori.   . EYE SURGERY    . gailstones    . IR FLUORO GUIDE CV LINE RIGHT  02/01/2017  . IR FLUORO GUIDE CV LINE RIGHT  03/06/2017  . IR FLUORO GUIDE CV LINE RIGHT  03/25/2017  . IR GENERIC HISTORICAL  01/24/2016   IR FLUORO GUIDE CV LINE RIGHT 01/24/2016 Darrell K Allred, PA-C [facility]  . IR GENERIC HISTORICAL  01/24/2016   IR US GUIDE VASC ACCESS RIGHT 01/24/2016 Darrell K Allred, PA-C [facility]  . IR US GUIDE VASC ACCESS RIGHT  02/01/2017  . IR US GUIDE VASC ACCESS RIGHT  03/06/2017  . IR US GUIDE VASC ACCESS RIGHT  03/25/2017  . LEFT  HEART CATH AND CORONARY ANGIOGRAPHY N/A 01/07/2018   Procedure: LEFT HEART CATH AND CORONARY ANGIOGRAPHY;  Surgeon: Troy Sine, MD;  Location: [place] CV [facility];  Service: Cardiovascular;  Laterality: N/A;  . POSTERIOR VITRECTOMY AND MEMBRANE PEEL-LEFT EYE  09/28/2002  . POSTERIOR VITRECTOMY AND MEMBRANE PEEL-RIGHT EYE  03/16/2002  . RETINAL DETACHMENT SURGERY    . TEE WITHOUT CARDIOVERSION N/A 01/14/2018   Procedure: TRANSESOPHAGEAL ECHOCARDIOGRAM (TEE);  Surgeon: Grace Isaac, MD;  Location: [place];  Service: Open Heart Surgery;  Laterality: N/A;      reports that she has never smoked. She has never used smokeless tobacco. She reports current drug use. Drug: Marijuana. She reports that she does not drink alcohol.  Allergies  Allergen Reactions  . Anesthetics, Amide Nausea And Vomiting  . Penicillins Diarrhea, Nausea And Vomiting and Other (See Comments)    Has patient had a PCN reaction causing immediate rash, facial/tongue/throat swelling, SOB or lightheadedness with hypotension: Yes Has patient had a PCN reaction causing severe rash involving mucus membranes or skin necrosis: No Has patient had a PCN reaction that required hospitalization No Has patient had a PCN reaction occurring within the last 10 years: Yes  If all of the above answers are "NO", then may proceed with Cephalosporin use.   . Buprenorphine Hcl Rash  . Encainide Nausea And Vomiting  . Metoclopramide Other (See Comments)    Dystonia, muscle rigidity.  Patient states she is only allergic to the "pill form, not the IV"    Family History  Problem Relation Age of Onset  . Cystic fibrosis Mother   . Hypertension Father   . Diabetes Brother   . Hypertension Maternal Grandmother     Prior to Admission medications   Medication Sig Start Date End Date Taking? Authorizing Provider  ammonium lactate (AMLACTIN) 12 % cream Apply topically as needed for dry skin. 07/08/18   Trula Slade, DPM  aspirin EC 81 MG tablet Take 1 tablet (81 mg total) by mouth daily. 06/03/18   Nahser, Wonda Cheng, MD  Blood Glucose Monitoring Suppl (ONETOUCH VERIO) w/Device KIT Use as directed 03/21/18   [name], MD  busPIRone (BUSPAR) 10 MG tablet Take 1 tablet (10 mg total) by mouth 3 (three) times daily. 06/12/18   [name], MD  diphenhydrAMINE (BENADRYL) 25 mg capsule Take 25 mg by mouth every 6 (six) hours as needed for allergies.    [provider]  docusate sodium (COLACE) 100 MG capsule Take 1 capsule (100 mg total) by mouth daily as needed for mild  constipation. 02/03/18   Burtis Junes, NP  furosemide (LASIX) 40 MG tablet Take 1 tablet (40 mg total) by mouth daily as needed for fluid or edema. 06/12/18 06/07/19  [name], MD  glucose blood (ACCU-CHEK AVIVA PLUS) test strip Use as instructed to test blood sugar 4 times daily Dx E10.65 12/18/18   Elayne Snare, MD  insulin aspart (NOVOLOG) 100 UNIT/ML injection Inject 0-12 Units into the skin 3 (three) times daily before meals. As per sliding scale 10/14/18   Elayne Snare, MD  insulin degludec (TRESIBA FLEXTOUCH) 100 UNIT/ML SOPN FlexTouch Pen Inject 0.15 mLs (15 Units total) into the skin daily. 12/18/18   Elayne Snare, MD  Insulin Glargine (LANTUS SOLOSTAR) 100 UNIT/ML Solostar Pen Inject 15 units under the skin once daily in the morning. 10/14/18   Elayne Snare, MD  Insulin Syringe-Needle U-100 (BD INSULIN SYRINGE ULTRAFINE) 31G X 5/16" 0.5 ML MISC 1 each by  Does not apply route 4 (four) times daily. 04/28/18   Elayne Snare, MD  Lancets [facility] DELICA PLUS MPNTIR44R) [place] 1 Units by Does not apply route as directed. 03/21/18   [name], MD  loperamide (IMODIUM) 2 MG capsule Take 1 capsule (2 mg total) by mouth as needed for diarrhea or loose stools. 04/15/18   Desiree Hane, MD  losartan (COZAAR) 25 MG tablet Take 1 tablet (25 mg total) by mouth daily. 10/08/18   Nahser, Wonda Cheng, MD  medroxyPROGESTERone (PROVERA) 10 MG tablet 1 tablet po BID until bleeding stops, then go down to one tablet a day Patient not taking: Reported on 12/15/2018 11/05/18   Salvadore Dom, MD  metoprolol tartrate (LOPRESSOR) 25 MG tablet Take 0.5 tablets (12.5 mg total) by mouth 2 (two) times daily. 10/08/18   Nahser, Wonda Cheng, MD  pantoprazole (PROTONIX) 40 MG tablet Take 1 tablet (40 mg total) by mouth daily. 06/12/18   [name], MD  potassium chloride (K-DUR) 10 MEQ tablet Take 1 tablet (10 mEq total) by mouth daily. 06/12/18   [name], MD  pregabalin (LYRICA) 150 MG capsule Take 1 capsule  (150 mg total) by mouth 2 (two) times daily. 12/18/18   Elayne Snare, MD  promethazine (PHENERGAN) 12.5 MG tablet Take 1 tablet (12.5 mg total) by mouth every 8 (eight) hours as needed for up to 30 doses for nausea or vomiting. 12/17/18   Amin, Jeanella Flattery, MD  pyridostigmine (MESTINON) 60 MG tablet Take 60 mg by mouth 3 (three) times daily.    [provider]    Physical Exam: Vitals:   12/22/18 0245 12/22/18 0247 12/22/18 0300 12/22/18 0319  BP: 139/61  (!) 156/86   Pulse: (!) 110  81 (!) 110  Resp: (!) 28  (!) 26 (!) 31  Temp: 98.1 F (36.7 C)     TempSrc: Rectal     SpO2: 100%  100% 100%  Weight:  72 kg    Height:  '5\' 2"'$  (1.575 m)        Constitutional: Moderately built and nourished. Vitals:   12/22/18 0245 12/22/18 0247 12/22/18 0300 12/22/18 0319  BP: 139/61  (!) 156/86   Pulse: (!) 110  81 (!) 110  Resp: (!) 28  (!) 26 (!) 31  Temp: 98.1 F (36.7 C)     TempSrc: Rectal     SpO2: 100%  100% 100%  Weight:  72 kg    Height:  '5\' 2"'$  (1.575 m)     Eyes: Anicteric no pallor. ENMT: No discharge from the ears eyes nose or mouth. Neck: No mass or.  No neck rigidity. Respiratory: No rhonchi or crepitations. Cardiovascular: S1-S2 heard. Abdomen: Soft nontender bowel sounds are seen. Musculoskeletal: No edema. Skin: No rash. Neurologic: Alert awake oriented to place and person.  Moves all extremities has tremors abnormal movements of the facial areas. Psychiatric: Oriented to name and place.   Labs on Admission: I have personally reviewed following labs and imaging studies  CBC: Recent Labs  Lab 12/15/18 0744 12/16/18 0615 12/17/18 0730 12/22/18 0137 12/22/18 0157 12/22/18 0202  WBC 14.0* 14.2* 5.5  --  10.6*  --   NEUTROABS  --   --   --   --  9.4*  --   HGB 11.2* 10.4* 9.0* 10.9* 10.5* 10.5*  HCT 36.4 33.1* 29.0* 32.0* 34.4* 31.0*  MCV 92.6 91.4 91.2  --  91.5  --   PLT 203 323 287  --  321  --  Basic Metabolic Panel: Recent Labs  Lab 12/15/18  2056 12/16/18 0153 12/16/18 0615 12/17/18 0730 12/22/18 0137 12/22/18 0157 12/22/18 0202  NA 145 144 142 136 137 136 136  K 3.6 3.2* 3.5 3.2* 5.1 5.1 5.0  CL 113* 113* 110 107  --  106  --   CO2 18* 24 21* 22  --  11*  --   GLUCOSE 147* 138* 75 94  --  457*  --   BUN 30* 32* 31* 20  --  16  --   CREATININE 1.64* 1.75* 1.64* 1.22*  --  1.58*  --   CALCIUM 9.3 9.1 8.8* 8.0*  --  9.1  --   MG  --   --   --  2.0  --   --   --    GFR: Estimated Creatinine Clearance: 39.2 mL/min (A) (by C-G formula based on SCr of 1.58 mg/dL (H)). Liver Function Tests: Recent Labs  Lab 12/15/18 0822 12/22/18 0157  AST 18 17  ALT 16 18  ALKPHOS 77 81  BILITOT 1.3* 1.7*  PROT 7.5 6.9  ALBUMIN 3.9 3.6   Recent Labs  Lab 12/15/18 0822 12/22/18 0157  LIPASE 25 21   No results for input(s): AMMONIA in the last 168 hours. Coagulation Profile: No results for input(s): INR, PROTIME in the last 168 hours. Cardiac Enzymes: Recent Labs  Lab 12/15/18 0822  TROPONINI <0.03   BNP (last 3 results) No results for input(s): PROBNP in the last 8760 hours. HbA1C: No results for input(s): HGBA1C in the last 72 hours. CBG: Recent Labs  Lab 12/16/18 2128 12/17/18 0736 12/17/18 1228 12/22/18 0112 12/22/18 0237  GLUCAP 286* 95 79 396* 409*   Lipid Profile: No results for input(s): CHOL, HDL, LDLCALC, TRIG, CHOLHDL, LDLDIRECT in the last 72 hours. Thyroid Function Tests: No results for input(s): TSH, T4TOTAL, FREET4, T3FREE, THYROIDAB in the last 72 hours. Anemia Panel: No results for input(s): VITAMINB12, FOLATE, FERRITIN, TIBC, IRON, RETICCTPCT in the last 72 hours. Urine analysis:    Component Value Date/Time   COLORURINE YELLOW 12/15/2018 0744   APPEARANCEUR HAZY (A) 12/15/2018 0744   LABSPEC 1.024 12/15/2018 0744   PHURINE 6.0 12/15/2018 0744   GLUCOSEU >=500 (A) 12/15/2018 0744   HGBUR MODERATE (A) 12/15/2018 0744   BILIRUBINUR NEGATIVE 12/15/2018 0744   BILIRUBINUR negative  11/13/2018 1102   KETONESUR NEGATIVE 12/15/2018 0744   PROTEINUR 30 (A) 12/15/2018 0744   UROBILINOGEN 0.2 11/13/2018 1102   UROBILINOGEN 0.2 04/27/2015 1608   NITRITE NEGATIVE 12/15/2018 0744   LEUKOCYTESUR LARGE (A) 12/15/2018 0744   Sepsis Labs: '@LABRCNTIP'$ (procalcitonin:4,lacticidven:4) ) Recent Results (from the past 240 hour(s))  SARS Coronavirus 2 (CEPHEID- Performed in [place] hospital lab), Hosp Order     Status: None   Collection Time: 12/15/18  8:23 AM   Specimen: Nasopharyngeal Swab  Result Value Ref Range Status   SARS Coronavirus 2 NEGATIVE NEGATIVE Final    Comment: (NOTE) If result is NEGATIVE SARS-CoV-2 target nucleic acids are NOT DETECTED. The SARS-CoV-2 RNA is generally detectable in upper and lower  respiratory specimens during the acute phase of infection. The lowest  concentration of SARS-CoV-2 viral copies this assay can detect is 250  copies / mL. A negative result does not preclude SARS-CoV-2 infection  and should not be used as the sole basis for treatment or other  patient management decisions.  A negative result may occur with  improper specimen collection / handling, submission of specimen other  than  nasopharyngeal swab, presence of viral mutation(s) within the  areas targeted by this assay, and inadequate number of viral copies  (<250 copies / mL). A negative result must be combined with clinical  observations, patient history, and epidemiological information. If result is POSITIVE SARS-CoV-2 target nucleic acids are DETECTED. The SARS-CoV-2 RNA is generally detectable in upper and lower  respiratory specimens dur ing the acute phase of infection.  Positive  results are indicative of active infection with SARS-CoV-2.  Clinical  correlation with patient history and other diagnostic information is  necessary to determine patient infection status.  Positive results do  not rule out bacterial infection or co-infection with other viruses. If  result is PRESUMPTIVE POSTIVE SARS-CoV-2 nucleic acids MAY BE PRESENT.   A presumptive positive result was obtained on the submitted specimen  and confirmed on repeat testing.  While 2019 novel coronavirus  (SARS-CoV-2) nucleic acids may be present in the submitted sample  additional confirmatory testing may be necessary for epidemiological  and / or clinical management purposes  to differentiate between  SARS-CoV-2 and other Sarbecovirus currently known to infect humans.  If clinically indicated additional testing with an alternate test  methodology [id number]) is advised. The SARS-CoV-2 RNA is generally  detectable in upper and lower respiratory sp ecimens during the acute  phase of infection. The expected result is Negative. Fact Sheet for Patients:  StrictlyIdeas.no Fact Sheet for Healthcare Providers: BankingDealers.co.za This test is not yet approved or cleared by the [place] FDA and has been authorized for detection and/or diagnosis of SARS-CoV-2 by FDA under an Emergency Use Authorization (EUA).  This EUA will remain in effect (meaning this test can be used) for the duration of the COVID-19 declaration under Section 564(b)(1) of the Act, 21 U.S.C. section 360bbb-3(b)(1), unless the authorization is terminated or revoked sooner. Performed at [facility], [place] [address] [place], [place] [address]   MRSA PCR Screening     Status: None   Collection Time: 12/15/18  4:40 PM   Specimen: Nasopharyngeal  Result Value Ref Range Status   MRSA by PCR NEGATIVE NEGATIVE Final    Comment:        The GeneXpert MRSA Assay (FDA approved for NASAL specimens only), is one component of a comprehensive MRSA colonization surveillance program. It is not intended to diagnose MRSA infection nor to guide or monitor treatment for MRSA infections. Performed at [facility], [place] [address], [place],  [place] [address]      Radiological Exams on Admission: Dg Chest Portable 1 View  Result Date: 12/22/2018 CLINICAL DATA:  Central line placement EXAM: PORTABLE CHEST 1 VIEW COMPARISON:  December 15, 2018 FINDINGS: The left-sided central venous catheter tip terminates over the SVC. Again noted are postsurgical changes related to prior median sternotomy. There is no evidence of a left-sided pneumothorax. There is mild volume overload. There may be a trace right-sided pleural effusion. There is no acute osseous abnormality. IMPRESSION: 1. Left-sided central venous catheter tip terminates over the proximal SVC. There is no evidence for left-sided pneumothorax. 2. Mild volume overload. 3. Status post median sternotomy. Electronically Signed   By: Constance Holster M.D.   On: 12/22/2018 02:36    EKG: Independently reviewed.  Normal sinus rhythm.  Assessment/Plan Principal Problem:   DKA (diabetic ketoacidoses) (HCC) Active Problems:   AKI (acute kidney injury) ([place])   S/P CABG x 3   Acute encephalopathy    1. Diabetic ketoacidosis type I -patient on arrival had  said that she may not have been compliant with the medication.  Likely contributing to her worsening gastroparesis and DKA.  Has recently followed up with her endocrinologist.  We will keep patient on IV fluids IV insulin infusion and will anion gap gets corrected and change to long-acting insulin.  Follow metabolic panel closely. 2. Intractable nausea vomiting and also was stating diarrhea.  Likely from gastroparesis.  Since patient has tardive dyskinesia will avoid Reglan.  Zofran as needed.  IV fluids.  Controlled diabetes. 3. Tardive dyskinesia avoid Reglan. 4. CAD status post CABG will check cardiac markers.  Metoprolol aspirin. 5. Acute kidney injury creatinine is increased to 1.5 at discharge it was around 1.2.  Continue to monitor metabolic panel and continue IV fluids.  UA is pending. 6. Hypertension on metoprolol Cozaar.  Cozaar will be on  hold until hydrated.  PRN IV hydralazine. 7. Appears to be chronic follow CBC.  During this admission patient has central line placed on the internal jugular.  Patient probably may need Port-A-Cath due to repeated admission and difficult IV access.   DVT prophylaxis: Lovenox. Code Status: Full code. Family Communication: Try to reach patient's sister with whom she lives were unable to contact. Disposition Plan: Home. Consults called: Case Freight forwarder. Admission status: Inpatient.   Rise Patience MD [facility] Pager [phone].  If 7PM-7AM, please contact night-coverage [email or web address] Password [facility]  12/22/2018, 3:26 AM

## 2018-12-22 NOTE — ED Notes (Signed)
ED TO INPATIENT HANDOFF REPORT  ED Nurse Name and Phone #: [phone]  S Name/Age/Gender [name] 53 y.o. female Room/Bed: 020C/020C  Code Status   Code Status: Prior  Home/SNF/Other Home Patient oriented to: self, place, time and situation Is this baseline? Yes   Triage Complete: Triage complete  Chief Complaint dka  Triage Note Pt arrivced via EMS from home with c/o n/v/d x 1 day, pt d/c on 6/24 from [facility] for DKA.  Pt reports she may have not been taking Insulin as she should have been, pt reports she lives with sister but has no help with meds.     Allergies Allergies  Allergen Reactions  . Anesthetics, Amide Nausea And Vomiting  . Penicillins Diarrhea, Nausea And Vomiting and Other (See Comments)    Has patient had a PCN reaction causing immediate rash, facial/tongue/throat swelling, SOB or lightheadedness with hypotension: Yes Has patient had a PCN reaction causing severe rash involving mucus membranes or skin necrosis: No Has patient had a PCN reaction that required hospitalization No Has patient had a PCN reaction occurring within the last 10 years: Yes  If all of the above answers are "NO", then may proceed with Cephalosporin use.   . Buprenorphine Hcl Rash  . Encainide Nausea And Vomiting  . Metoclopramide Other (See Comments)    Dystonia, muscle rigidity.  Patient states she is only allergic to the "pill form, not the IV"    Level of Care/Admitting Diagnosis ED Disposition    ED Disposition Condition [place]: [place] [100100]  Level of Care: Progressive [102]  Covid Evaluation: Screening Protocol (No Symptoms)  Diagnosis: DKA (diabetic ketoacidoses) [facility]) [326712]  Admitting Physician: Rise Patience [phone]  Attending Physician: Rise Patience [phone]  Estimated length of stay: past midnight tomorrow  Certification:: I certify this patient will need inpatient services for at least 2 midnights  PT  Class (Do Not Modify): Inpatient [101]  PT Acc Code (Do Not Modify): Private [1]       B Medical/Surgery History Past Medical History:  Diagnosis Date  . AKI (acute kidney injury) ([place])   . Anemia, iron deficiency   . Anxiety and depression 05/18/2015  . CAD (coronary artery disease), native coronary artery 01/11/2018   Non-STEMI with severe three-vessel disease noted July 2019  . Depression   . Diabetes mellitus without complication ([place])   . Diabetic gastroparesis ([place])   . Diabetic neuropathy, type I diabetes mellitus ([place]) 05/18/2015  . DKA (diabetic ketoacidoses) ([place]) 06/16/2017  . DKA, type 1 ([place]) 03/18/2016  . Essential hypertension   . Gastroparesis   . GERD (gastroesophageal reflux disease)   . HLD (hyperlipidemia)   . MDD (major depressive disorder), single episode, severe , no psychosis ([place])   . Non-ST elevation (NSTEMI) myocardial infarction ([place])   . S/P CABG x 3 01/14/2018  . Tardive dyskinesia   . Vitamin B12 deficiency 08/16/2015   Past Surgical History:  Procedure Laterality Date  . CARDIAC SURGERY    . COLONOSCOPY  09/27/2014   at [facility]  . CORONARY ARTERY BYPASS GRAFT N/A 01/14/2018   Procedure: CORONARY ARTERY BYPASS GRAFTING (CABG)X3, RIGHT AND LEFT SAPHENOUS VEIN HARVEST, MAMMARY TAKE DOWN. MAMMARY TO LAD, SVG TO PD, SVG TO DISTAL CIRC.;  Surgeon: Grace Isaac, MD;  Location: [place];  Service: Open Heart Surgery;  Laterality: N/A;  . ESOPHAGOGASTRODUODENOSCOPY  09/27/2014   at [facility], Dr Rolan Lipa. biospy neg for celiac, neg  for H pylori.   . EYE SURGERY    . gailstones    . IR FLUORO GUIDE CV LINE RIGHT  02/01/2017  . IR FLUORO GUIDE CV LINE RIGHT  03/06/2017  . IR FLUORO GUIDE CV LINE RIGHT  03/25/2017  . IR GENERIC HISTORICAL  01/24/2016   IR FLUORO GUIDE CV LINE RIGHT 01/24/2016 Darrell K Allred, PA-C [facility]-INTERV RAD  . IR GENERIC HISTORICAL  01/24/2016   IR US GUIDE VASC ACCESS RIGHT 01/24/2016 Darrell K Allred, PA-C [facility]  . IR US GUIDE VASC  ACCESS RIGHT  02/01/2017  . IR US GUIDE VASC ACCESS RIGHT  03/06/2017  . IR US GUIDE VASC ACCESS RIGHT  03/25/2017  . LEFT HEART CATH AND CORONARY ANGIOGRAPHY N/A 01/07/2018   Procedure: LEFT HEART CATH AND CORONARY ANGIOGRAPHY;  Surgeon: Troy Sine, MD;  Location: [place] CV LAB;  Service: Cardiovascular;  Laterality: N/A;  . POSTERIOR VITRECTOMY AND MEMBRANE PEEL-LEFT EYE  09/28/2002  . POSTERIOR VITRECTOMY AND MEMBRANE PEEL-RIGHT EYE  03/16/2002  . RETINAL DETACHMENT SURGERY    . TEE WITHOUT CARDIOVERSION N/A 01/14/2018   Procedure: TRANSESOPHAGEAL ECHOCARDIOGRAM (TEE);  Surgeon: Grace Isaac, MD;  Location: [place];  Service: Open Heart Surgery;  Laterality: N/A;     A IV Location/Drains/Wounds Patient Lines/Drains/Airways Status   Active Line/Drains/Airways    Name:   Placement date:   Placement time:   Site:   Days:   Peripheral IV 12/22/18 Right Other (Comment)   12/22/18    0145    Other (Comment)   less than 1   CVC Triple Lumen 12/22/18 Left External jugular   12/22/18    0225     less than 1   Incision (Closed) 01/14/18 Chest Mid   01/14/18    0855     342   Incision (Closed) 01/14/18 Leg Right   01/14/18    0855     342   Incision (Closed) 01/14/18 Leg Left   01/14/18    1014     342          Intake/Output Last 24 hours No intake or output data in the 24 hours ending 12/22/18 0316  Labs/Imaging Results for orders placed or performed during the hospital encounter of 12/22/18 (from the past 48 hour(s))  CBG monitoring, ED     Status: Abnormal   Collection Time: 12/22/18  1:12 AM  Result Value Ref Range   Glucose-Capillary 396 (H) 70 - 99 mg/dL  POCT I-Stat EG7     Status: Abnormal   Collection Time: 12/22/18  1:37 AM  Result Value Ref Range   pH, Ven 7.330 7.250 - 7.430   pCO2, Ven 25.5 (L) 44.0 - 60.0 mmHg   pO2, Ven 125.0 (H) 32.0 - 45.0 mmHg   Bicarbonate 13.5 (L) 20.0 - 28.0 mmol/L   TCO2 14 (L) 22 - 32 mmol/L   O2 Saturation 99.0 %   Acid-base deficit  11.0 (H) 0.0 - 2.0 mmol/L   Sodium 137 135 - 145 mmol/L   Potassium 5.1 3.5 - 5.1 mmol/L   Calcium, Ion 1.23 1.15 - 1.40 mmol/L   HCT 32.0 (L) 36.0 - 46.0 %   Hemoglobin 10.9 (L) 12.0 - 15.0 g/dL   Patient temperature HIDE    Sample type VENOUS   Comprehensive metabolic panel     Status: Abnormal   Collection Time: 12/22/18  1:57 AM  Result Value Ref Range   Sodium 136 135 - 145 mmol/L  Potassium 5.1 3.5 - 5.1 mmol/L   Chloride 106 98 - 111 mmol/L   CO2 11 (L) 22 - 32 mmol/L   Glucose, Bld 457 (H) 70 - 99 mg/dL   BUN 16 6 - 20 mg/dL   Creatinine, Ser 1.58 (H) 0.44 - 1.00 mg/dL   Calcium 9.1 8.9 - 10.3 mg/dL   Total Protein 6.9 6.5 - 8.1 g/dL   Albumin 3.6 3.5 - 5.0 g/dL   AST 17 15 - 41 U/L   ALT 18 0 - 44 U/L   Alkaline Phosphatase 81 38 - 126 U/L   Total Bilirubin 1.7 (H) 0.3 - 1.2 mg/dL   GFR calc non Af Amer 37 (L) >60 mL/min   GFR calc Af Amer 43 (L) >60 mL/min   Anion gap 19 (H) 5 - 15    Comment: Performed at [facility] [address], [place], [place] [address]  Lipase, blood     Status: None   Collection Time: 12/22/18  1:57 AM  Result Value Ref Range   Lipase 21 11 - 51 U/L    Comment: Performed at [facility] [address] [place], [place] [address]  CBC with Differential     Status: Abnormal   Collection Time: 12/22/18  1:57 AM  Result Value Ref Range   WBC 10.6 (H) 4.0 - 10.5 K/uL   RBC 3.76 (L) 3.87 - 5.11 MIL/uL   Hemoglobin 10.5 (L) 12.0 - 15.0 g/dL   HCT 34.4 (L) 36.0 - 46.0 %   MCV 91.5 80.0 - 100.0 fL   MCH 27.9 26.0 - 34.0 pg   MCHC 30.5 30.0 - 36.0 g/dL   RDW 13.4 11.5 - 15.5 %   Platelets 321 150 - 400 K/uL   nRBC 0.0 0.0 - 0.2 %   Neutrophils Relative % 90 %   Neutro Abs 9.4 (H) 1.7 - 7.7 K/uL   Lymphocytes Relative 6 %   Lymphs Abs 0.7 0.7 - 4.0 K/uL   Monocytes Relative 4 %   Monocytes Absolute 0.5 0.1 - 1.0 K/uL   Eosinophils Relative 0 %   Eosinophils Absolute 0.0 0.0 - 0.5 K/uL   Basophils Relative 0 %   Basophils  Absolute 0.0 0.0 - 0.1 K/uL   Immature Granulocytes 0 %   Abs Immature Granulocytes 0.04 0.00 - 0.07 K/uL    Comment: Performed at [facility], [place] [address] [place], [place] [address]  Lactic acid, plasma     Status: Abnormal   Collection Time: 12/22/18  2:00 AM  Result Value Ref Range   Lactic Acid, Venous 2.9 (HH) 0.5 - 1.9 mmol/L    Comment: CRITICAL RESULT CALLED TO, READ BACK BY AND VERIFIED WITH: FLORES,M RN 12/22/2018 0235 JORDANS Performed at [facility], [place] [address], [place],  [address]   POCT I-Stat EG7     Status: Abnormal   Collection Time: 12/22/18  2:02 AM  Result Value Ref Range   pH, Ven 7.331 7.250 - 7.430   pCO2, Ven 24.2 (L) 44.0 - 60.0 mmHg   pO2, Ven 61.0 (H) 32.0 - 45.0 mmHg   Bicarbonate 12.8 (L) 20.0 - 28.0 mmol/L   TCO2 13 (L) 22 - 32 mmol/L   O2 Saturation 90.0 %   Acid-base deficit 12.0 (H) 0.0 - 2.0 mmol/L   Sodium 136 135 - 145 mmol/L   Potassium 5.0 3.5 - 5.1 mmol/L   Calcium, Ion 1.14 (L) 1.15 - 1.40  mmol/L   HCT 31.0 (L) 36.0 - 46.0 %   Hemoglobin 10.5 (L) 12.0 - 15.0 g/dL   Patient temperature HIDE    Sample type VENOUS   CBG monitoring, ED     Status: Abnormal   Collection Time: 12/22/18  2:37 AM  Result Value Ref Range   Glucose-Capillary 409 (H) 70 - 99 mg/dL   Dg Chest Portable 1 View  Result Date: 12/22/2018 CLINICAL DATA:  Central line placement EXAM: PORTABLE CHEST 1 VIEW COMPARISON:  December 15, 2018 FINDINGS: The left-sided central venous catheter tip terminates over the SVC. Again noted are postsurgical changes related to prior median sternotomy. There is no evidence of a left-sided pneumothorax. There is mild volume overload. There may be a trace right-sided pleural effusion. There is no acute osseous abnormality. IMPRESSION: 1. Left-sided central venous catheter tip terminates over the proximal SVC. There is no evidence for left-sided pneumothorax. 2. Mild volume overload. 3. Status post median sternotomy.  Electronically Signed   By: Constance Holster M.D.   On: 12/22/2018 02:36    Pending Labs Unresulted Labs (From admission, onward)    Start     Ordered   12/22/18 0250  Novel Coronavirus,NAA,(SEND-OUT TO REF LAB - TAT 24-48 hrs); Hosp Order  (Asymptomatic Patients Labs)  ONCE - STAT,   STAT    Question:  Rule Out  Answer:  Yes   12/22/18 0250   12/22/18 0231  Troponin I (High Sensitivity)  ONCE - STAT,   STAT    Question:  Indication  Answer:  Suspect ACS   12/22/18 0231   12/22/18 0122  Urinalysis, Routine w reflex microscopic  Once,   STAT     12/22/18 0122   12/22/18 0122  Lactic acid, plasma  Now then every 2 hours,   STAT     12/22/18 0122          Vitals/Pain Today's Vitals   12/22/18 0245 12/22/18 0246 12/22/18 0247 12/22/18 0300  BP: 139/61   (!) 156/86  Pulse: (!) 110   81  Resp: (!) 28   (!) 26  Temp: 98.1 F (36.7 C)     TempSrc: Rectal     SpO2: 100%   100%  Weight:   72 kg   Height:   5\' 2"  (1.575 m)   PainSc:  10-Worst pain ever      Isolation Precautions No active isolations  Medications Medications  dextrose 5 %-0.45 % sodium chloride infusion (has no administration in time range)  insulin regular bolus via infusion 0-10 Units (has no administration in time range)  insulin regular, human (MYXREDLIN) 100 units/ 100 mL infusion (3.5 Units/hr Intravenous New Bag/Given 12/22/18 0241)  dextrose 50 % solution 25 mL (has no administration in time range)  0.9 %  sodium chloride infusion (has no administration in time range)  fentaNYL (SUBLIMAZE) injection 50 mcg (50 mcg Intravenous Given 12/22/18 0159)  ondansetron (ZOFRAN) injection 4 mg (4 mg Intravenous Given 12/22/18 0158)  lactated ringers bolus 2,000 mL (2,000 mLs Intravenous New Bag/Given 12/22/18 0157)  lactated ringers bolus 1,000 mL (1,000 mLs Intravenous New Bag/Given 12/22/18 0301)  promethazine (PHENERGAN) injection 25 mg (25 mg Intravenous Given 12/22/18 0301)    Mobility walks     Focused  Assessments Neuro Assessment Handoff:  Swallow screen pass?n/a          Neuro Assessment:   Neuro Checks:      Last Documented NIHSS Modified Score:   Has TPA been given?  No If patient is a Neuro Trauma and patient is going to OR before floor call report to [place] nurse: [phone] or [phone]     R Recommendations: See Admitting Provider Note  Report given to:   Additional Notes: DKA order set, CVC in place

## 2018-12-22 NOTE — Progress Notes (Signed)
Pt not feeling well on TOC visit and asked that TOC see her at a later time.

## 2018-12-22 NOTE — Progress Notes (Signed)
Inpatient Diabetes Program Recommendations  AACE/ADA: New Consensus Statement on Inpatient Glycemic Control (2015)  Target Ranges:  Prepandial:   less than 140 mg/dL      Peak postprandial:   less than 180 mg/dL (1-2 hours)      Critically ill patients:  140 - 180 mg/dL   Lab Results  Component Value Date   GLUCAP 129 (H) 12/22/2018   HGBA1C 9.8 (H) 12/15/2018    Review of Glycemic Control  Diabetes history: DM 1 Outpatient Diabetes medications:  Lantus 15 units, Novolog 0-12 units SSI and to cover carbohydrate intake  Current orders for Inpatient glycemic control:  IV insulin gtt   Patient sees Dr. Dwyane Dee, Endocrinologist, for DM management. Last visit was on 6/25. Patient last seen by DM Coordinator on 6/23 and discussed sick day guidelines with patient and taking her insulin.    Patient was referred to outpatient nutrition education for gastroparesis.  BMETs currently not ordered. Sent Secure chat message to Dr. Cyndia Skeeters.  A1c 9.8% on 6/22, was addressed by Endocrinologist.  Will follow trends. Based on Last BMET patient may require IV insulin until CO2 normalizes in addition to corrected anion gap and glucose.  Thanks,  Tama Headings RN, MSN, BC-ADM Inpatient Diabetes Coordinator Team Pager [phone] (8a-5p)

## 2018-12-22 NOTE — Progress Notes (Signed)
Both lumens flushed with out resistance and GBR. Cap changed. [place]

## 2018-12-22 NOTE — ED Notes (Signed)
Emesis x 2 with significant heaving while on bed pan

## 2018-12-22 NOTE — Progress Notes (Signed)
PROGRESS NOTE  [name] NWG:[id number] DOB: [birth year] DOA: 12/22/2018 PCP: [name], MD   LOS: 0 days   Patient is from: Home  Brief Narrative / Interim history: 53 year old female with history of DM-1, tardive dyskinesia secondary to Reglan, CAD status post CABG, depression, severe gastroparesis on Mestinon admitted with DKA and GI symptoms.  Patient was hospitalized 6/22-6/24 for DKA and discharged on Lantus 15 units and SSI 0 to 12 units.  Reports good compliance with her medication.  Endorses UTI symptoms.  Started on insulin drip and IV fluids.   Subjective: Patient continues to endorse nausea and vomiting.  She had heaving.  She says she has been on Phenergan at home.  She also reports urinary symptoms.  She is not quite sure about fever.  But feels hot and cold.  Notable tardive dyskinesia.  Objective: Vitals:   12/22/18 0319 12/22/18 0330 12/22/18 0440 12/22/18 0635  BP:  (!) 161/64    Pulse: (!) 110 (!) 112 (!) 113   Resp: (!) 31 (!) 40 (!) 28   Temp:    97.8 F (36.6 C)  TempSrc:    Oral  SpO2: 100% 100% 100%   Weight:      Height:       No intake or output data in the 24 hours ending 12/22/18 1620 Filed Weights   12/22/18 0247  Weight: 72 kg    Examination:  GENERAL: Appears to be nauseous and heaving.  Notable tardive dyskinesia. HEENT: MMM.  Vision and hearing grossly intact.  NECK: Supple.  No JVD.  LUNGS:  No IWOB. Good air movement bilaterally. HEART:  RRR. Heart sounds normal.  ABD: Bowel sounds present. Soft.  Mild diffuse tenderness. MSK/EXT:  Moves all extremities. No apparent deformity. No edema bilaterally.  SKIN: no apparent skin lesion or wound NEURO: Awake, alert and oriented appropriately.  No gross deficit.  Tardive dyskinesia  I have personally reviewed the following labs and images:  Radiology Studies: Dg Chest Portable 1 View  Result Date: 12/22/2018 CLINICAL DATA:  Central line placement EXAM: PORTABLE CHEST 1 VIEW  COMPARISON:  December 15, 2018 FINDINGS: The left-sided central venous catheter tip terminates over the SVC. Again noted are postsurgical changes related to prior median sternotomy. There is no evidence of a left-sided pneumothorax. There is mild volume overload. There may be a trace right-sided pleural effusion. There is no acute osseous abnormality. IMPRESSION: 1. Left-sided central venous catheter tip terminates over the proximal SVC. There is no evidence for left-sided pneumothorax. 2. Mild volume overload. 3. Status post median sternotomy. Electronically Signed   By: Constance Holster M.D.   On: 12/22/2018 02:36    Microbiology: Recent Results (from the past 240 hour(s))  SARS Coronavirus 2 (CEPHEID- Performed in [facility] hospital lab), Hosp Order     Status: None   Collection Time: 12/15/18  8:23 AM   Specimen: Nasopharyngeal Swab  Result Value Ref Range Status   SARS Coronavirus 2 NEGATIVE NEGATIVE Final    Comment: (NOTE) If result is NEGATIVE SARS-CoV-2 target nucleic acids are NOT DETECTED. The SARS-CoV-2 RNA is generally detectable in upper and lower  respiratory specimens during the acute phase of infection. The lowest  concentration of SARS-CoV-2 viral copies this assay can detect is 250  copies / mL. A negative result does not preclude SARS-CoV-2 infection  and should not be used as the sole basis for treatment or other  patient management decisions.  A negative result may occur with  improper specimen  collection / handling, submission of specimen other  than nasopharyngeal swab, presence of viral mutation(s) within the  areas targeted by this assay, and inadequate number of viral copies  (<250 copies / mL). A negative result must be combined with clinical  observations, patient history, and epidemiological information. If result is POSITIVE SARS-CoV-2 target nucleic acids are DETECTED. The SARS-CoV-2 RNA is generally detectable in upper and lower  respiratory specimens  dur ing the acute phase of infection.  Positive  results are indicative of active infection with SARS-CoV-2.  Clinical  correlation with patient history and other diagnostic information is  necessary to determine patient infection status.  Positive results do  not rule out bacterial infection or co-infection with other viruses. If result is PRESUMPTIVE POSTIVE SARS-CoV-2 nucleic acids MAY BE PRESENT.   A presumptive positive result was obtained on the submitted specimen  and confirmed on repeat testing.  While 2019 novel coronavirus  (SARS-CoV-2) nucleic acids may be present in the submitted sample  additional confirmatory testing may be necessary for epidemiological  and / or clinical management purposes  to differentiate between  SARS-CoV-2 and other Sarbecovirus currently known to infect humans.  If clinically indicated additional testing with an alternate test  methodology [id number]) is advised. The SARS-CoV-2 RNA is generally  detectable in upper and lower respiratory sp ecimens during the acute  phase of infection. The expected result is Negative. Fact Sheet for Patients:  StrictlyIdeas.no Fact Sheet for Healthcare Providers: BankingDealers.co.za This test is not yet approved or cleared by the [place] FDA and has been authorized for detection and/or diagnosis of SARS-CoV-2 by FDA under an Emergency Use Authorization (EUA).  This EUA will remain in effect (meaning this test can be used) for the duration of the COVID-19 declaration under Section 564(b)(1) of the Act, 21 U.S.C. section 360bbb-3(b)(1), unless the authorization is terminated or revoked sooner. Performed at [facility], [place] [address], [place], [place] [address]   MRSA PCR Screening     Status: None   Collection Time: 12/15/18  4:40 PM   Specimen: Nasopharyngeal  Result Value Ref Range Status   MRSA by PCR NEGATIVE NEGATIVE Final    Comment:         The GeneXpert MRSA Assay (FDA approved for NASAL specimens only), is one component of a comprehensive MRSA colonization surveillance program. It is not intended to diagnose MRSA infection nor to guide or monitor treatment for MRSA infections. Performed at [facility], [place] [address], [place], [place] [address]     Sepsis Labs: Invalid input(s): PROCALCITONIN, LACTICIDVEN  Urine analysis:    Component Value Date/Time   COLORURINE YELLOW 12/15/2018 0744   APPEARANCEUR HAZY (A) 12/15/2018 0744   LABSPEC 1.024 12/15/2018 0744   PHURINE 6.0 12/15/2018 0744   GLUCOSEU >=500 (A) 12/15/2018 0744   HGBUR MODERATE (A) 12/15/2018 0744   BILIRUBINUR NEGATIVE 12/15/2018 0744   BILIRUBINUR negative 11/13/2018 1102   KETONESUR NEGATIVE 12/15/2018 0744   PROTEINUR 30 (A) 12/15/2018 0744   UROBILINOGEN 0.2 11/13/2018 1102   UROBILINOGEN 0.2 04/27/2015 1608   NITRITE NEGATIVE 12/15/2018 0744   LEUKOCYTESUR LARGE (A) 12/15/2018 0744    Anemia Panel: No results for input(s): VITAMINB12, FOLATE, FERRITIN, TIBC, IRON, RETICCTPCT in the last 72 hours.  Thyroid Function Tests: No results for input(s): TSH, T4TOTAL, FREET4, T3FREE, THYROIDAB in the last 72 hours.  Lipid Profile: No results for input(s): CHOL, HDL, LDLCALC, TRIG, CHOLHDL, LDLDIRECT in the last 72 hours.  CBG:  Recent Labs  Lab 12/22/18 1117 12/22/18 1223 12/22/18 1321 12/22/18 1458 12/22/18 1606  GLUCAP 129* 114* 115* 166* 180*    HbA1C: No results for input(s): HGBA1C in the last 72 hours.  BNP (last 3 results): No results for input(s): PROBNP in the last 8760 hours.  Cardiac Enzymes: No results for input(s): CKTOTAL, CKMB, CKMBINDEX, TROPONINI in the last 168 hours.  Coagulation Profile: No results for input(s): INR, PROTIME in the last 168 hours.  Liver Function Tests: Recent Labs  Lab 12/22/18 0157  AST 17  ALT 18  ALKPHOS 81  BILITOT 1.7*  PROT 6.9  ALBUMIN 3.6    Recent Labs  Lab 12/22/18 0157  LIPASE 21   No results for input(s): AMMONIA in the last 168 hours.  Basic Metabolic Panel: Recent Labs  Lab 12/16/18 0153 12/16/18 0615 12/17/18 0730 12/22/18 0137 12/22/18 0157 12/22/18 0202 12/22/18 1406  NA 144 142 136 137 136 136 141  K 3.2* 3.5 3.2* 5.1 5.1 5.0 3.8  CL 113* 110 107  --  106  --  111  CO2 24 21* 22  --  11*  --  21*  GLUCOSE 138* 75 94  --  457*  --  171*  BUN 32* 31* 20  --  16  --  11  CREATININE 1.75* 1.64* 1.22*  --  1.58*  --  1.19*  CALCIUM 9.1 8.8* 8.0*  --  9.1  --  9.0  MG  --   --  2.0  --   --   --  2.0   GFR: Estimated Creatinine Clearance: 52 mL/min (A) (by C-G formula based on SCr of 1.19 mg/dL (H)).  CBC: Recent Labs  Lab 12/16/18 0615 12/17/18 0730 12/22/18 0137 12/22/18 0157 12/22/18 0202  WBC 14.2* 5.5  --  10.6*  --   NEUTROABS  --   --   --  9.4*  --   HGB 10.4* 9.0* 10.9* 10.5* 10.5*  HCT 33.1* 29.0* 32.0* 34.4* 31.0*  MCV 91.4 91.2  --  91.5  --   PLT 323 287  --  321  --     Procedures:  None  Microbiology: 6/29-urine cultures pending 6/29-COVID-19 pending  Assessment & Plan: DKA/DM-1: Unclear why she went into DKA.  Differentials include nonadherence, insufficient insulin, UTI or dehydration from nausea/vomiting.  -Anion gap closed. -Feed, transition to subcu insulin and continue IV hydration. -CBG 1 hour x 2 after transition and then ACHS -Check urinalysis and urine culture -Resume home Lyrica.  Intractable nausea/vomiting: Likely from gastroparesis or DKA.  Lipase and LFT within normal range. -IV Zofran and Phenergan -History of third-degree with Reglan -IV fluid as above -We will consider CT abdomen if no improvement.  Sepsis due to UTI?:  Endorse UTI symptoms.  Also reports feeling hot and cold.  Have sirs criteria with tachycardia, tachypnea and leukocytosis.  Also with lactic acidosis.  Source of the lactic acidosis could also be due to DKA. -Urinalysis, urine  cultures, blood cultures -Start IV ceftriaxone -IV hydration -Continue trending lactic acid.  Lactic acidosis: Due to the above -Continue trending  History of CAD status post CABG, no cardiopulmonary symptoms. -Continue meds  AKI: Likely prerenal due to GI loss and DKA. -IV fluid as above  Hypertension/tachycardia: SBP elevated to 160s. -Increase home metoprolol to 25 mg twice daily -Continue holding losartan and Lasix  Tardive dyskinesia due to Reglan -Continue home Mestinon -Avoid Reglan or Compazine.  DVT prophylaxis: Subcu Lovenox Code Status: Full  code Family Communication: Patient's sister and son did not answer the phone. Disposition Plan: Remains inpatient for DKA, nausea, vomiting and potential sepsis from UTI. Consultants: None  Antimicrobials: Anti-infectives (From admission, onward)   Start     Dose/Rate Route Frequency Ordered Stop   12/22/18 1615  cefTRIAXone (ROCEPHIN) 1 g in sodium chloride 0.9 % 100 mL IVPB     1 g 200 mL/hr over 30 Minutes Intravenous Every 24 hours 12/22/18 1601        Sch Meds:  Scheduled Meds:  aspirin EC  81 mg Oral Daily   busPIRone  10 mg Oral TID   enoxaparin (LOVENOX) injection  40 mg Subcutaneous Q24H   insulin aspart  0-20 Units Subcutaneous TID WC   insulin aspart  0-5 Units Subcutaneous QHS   insulin glargine  15 Units Subcutaneous Once   metoprolol tartrate  25 mg Oral BID   pantoprazole  40 mg Oral Daily   pregabalin  150 mg Oral BID   pyridostigmine  60 mg Oral TID   Continuous Infusions:  sodium chloride Stopped (12/22/18 0733)   cefTRIAXone (ROCEPHIN)  IV     dextrose 5 % and 0.45% NaCl     insulin 0.5 Units/hr (12/22/18 1525)   lactated ringers     ondansetron (ZOFRAN) IV 8 mg (12/22/18 1149)   PRN Meds:.ondansetron (ZOFRAN) IV, promethazine  Gabrial Poppell T. [place]  If 7PM-7AM, please contact night-coverage [email or web address] Password [email or web address] 12/22/2018, 4:20 PM

## 2018-12-22 NOTE — Progress Notes (Signed)
Attempted to call pt's sister for update, but no answer. Pt stated that she would speak to her later.

## 2018-12-22 NOTE — ED Provider Notes (Signed)
TIME SEEN: 2:32 AM  CHIEF COMPLAINT: Nausea, vomiting, diarrhea  HPI: Patient is a 53 year old female with history of CAD status post CABG, diabetes, gastroparesis, previous episodes of DKA, hypertension hyperlipidemia who presents to the emergency department with nausea, vomiting and diarrhea that started today.  Blood sugar in the 400s with EMS.  She had Kusmaul respirations per EMS.  They were unable to obtain IV access.  Patient unable to provide much history.  She complains of chest and abdominal pain.  Complains of bilateral leg pain from neuropathy.  Denies known fever.  She was given 4 mg of IM Zofran by EMS.  ROS: Level 5 caveat for altered mental status  PAST MEDICAL HISTORY/PAST SURGICAL HISTORY:  Past Medical History:  Diagnosis Date  . AKI (acute kidney injury) ([place])   . Anemia, iron deficiency   . Anxiety and depression 05/18/2015  . CAD (coronary artery disease), native coronary artery 01/11/2018   Non-STEMI with severe three-vessel disease noted July 2019  . Depression   . Diabetes mellitus without complication ([place])   . Diabetic gastroparesis ([place])   . Diabetic neuropathy, type I diabetes mellitus ([place]) 05/18/2015  . DKA (diabetic ketoacidoses) ([place]) 06/16/2017  . DKA, type 1 ([place]) 03/18/2016  . Essential hypertension   . Gastroparesis   . GERD (gastroesophageal reflux disease)   . HLD (hyperlipidemia)   . MDD (major depressive disorder), single episode, severe , no psychosis ([place])   . Non-ST elevation (NSTEMI) myocardial infarction ([place])   . S/P CABG x 3 01/14/2018  . Tardive dyskinesia   . Vitamin B12 deficiency 08/16/2015    MEDICATIONS:  Prior to Admission medications   Medication Sig Start Date End Date Taking? Authorizing Provider  ammonium lactate (AMLACTIN) 12 % cream Apply topically as needed for dry skin. 07/08/18   Trula Slade, DPM  aspirin EC 81 MG tablet Take 1 tablet (81 mg total) by mouth daily. 06/03/18   Nahser, Wonda Cheng, MD  Blood Glucose  Monitoring Suppl (ONETOUCH VERIO) w/Device KIT Use as directed 03/21/18   [name], MD  busPIRone (BUSPAR) 10 MG tablet Take 1 tablet (10 mg total) by mouth 3 (three) times daily. 06/12/18   [name], MD  diphenhydrAMINE (BENADRYL) 25 mg capsule Take 25 mg by mouth every 6 (six) hours as needed for allergies.    [provider]  docusate sodium (COLACE) 100 MG capsule Take 1 capsule (100 mg total) by mouth daily as needed for mild constipation. 02/03/18   Burtis Junes, NP  furosemide (LASIX) 40 MG tablet Take 1 tablet (40 mg total) by mouth daily as needed for fluid or edema. 06/12/18 06/07/19  [name], MD  glucose blood (ACCU-CHEK AVIVA PLUS) test strip Use as instructed to test blood sugar 4 times daily Dx E10.65 12/18/18   Elayne Snare, MD  insulin aspart (NOVOLOG) 100 UNIT/ML injection Inject 0-12 Units into the skin 3 (three) times daily before meals. As per sliding scale 10/14/18   Elayne Snare, MD  insulin degludec (TRESIBA FLEXTOUCH) 100 UNIT/ML SOPN FlexTouch Pen Inject 0.15 mLs (15 Units total) into the skin daily. 12/18/18   Elayne Snare, MD  Insulin Glargine (LANTUS SOLOSTAR) 100 UNIT/ML Solostar Pen Inject 15 units under the skin once daily in the morning. 10/14/18   Elayne Snare, MD  Insulin Syringe-Needle U-100 (BD INSULIN SYRINGE ULTRAFINE) 31G X 5/16" 0.5 ML MISC 1 each by Does not apply route 4 (four) times daily. 04/28/18   Elayne Snare, MD  Lancets [facility]  DELICA PLUS [id number]) MISC 1 Units by Does not apply route as directed. 03/21/18   [name], MD  loperamide (IMODIUM) 2 MG capsule Take 1 capsule (2 mg total) by mouth as needed for diarrhea or loose stools. 04/15/18   Desiree Hane, MD  losartan (COZAAR) 25 MG tablet Take 1 tablet (25 mg total) by mouth daily. 10/08/18   Nahser, Wonda Cheng, MD  medroxyPROGESTERone (PROVERA) 10 MG tablet 1 tablet po BID until bleeding stops, then go down to one tablet a day Patient not taking: Reported on 12/15/2018  11/05/18   Salvadore Dom, MD  metoprolol tartrate (LOPRESSOR) 25 MG tablet Take 0.5 tablets (12.5 mg total) by mouth 2 (two) times daily. 10/08/18   Nahser, Wonda Cheng, MD  pantoprazole (PROTONIX) 40 MG tablet Take 1 tablet (40 mg total) by mouth daily. 06/12/18   [name], MD  potassium chloride (K-DUR) 10 MEQ tablet Take 1 tablet (10 mEq total) by mouth daily. 06/12/18   [name], MD  pregabalin (LYRICA) 150 MG capsule Take 1 capsule (150 mg total) by mouth 2 (two) times daily. 12/18/18   Elayne Snare, MD  promethazine (PHENERGAN) 12.5 MG tablet Take 1 tablet (12.5 mg total) by mouth every 8 (eight) hours as needed for up to 30 doses for nausea or vomiting. 12/17/18   Amin, Jeanella Flattery, MD  pyridostigmine (MESTINON) 60 MG tablet Take 60 mg by mouth 3 (three) times daily.    [provider]    ALLERGIES:  Allergies  Allergen Reactions  . Anesthetics, Amide Nausea And Vomiting  . Penicillins Diarrhea, Nausea And Vomiting and Other (See Comments)    Has patient had a PCN reaction causing immediate rash, facial/tongue/throat swelling, SOB or lightheadedness with hypotension: Yes Has patient had a PCN reaction causing severe rash involving mucus membranes or skin necrosis: No Has patient had a PCN reaction that required hospitalization No Has patient had a PCN reaction occurring within the last 10 years: Yes  If all of the above answers are "NO", then may proceed with Cephalosporin use.   . Buprenorphine Hcl Rash  . Encainide Nausea And Vomiting  . Metoclopramide Other (See Comments)    Dystonia, muscle rigidity.  Patient states she is only allergic to the "pill form, not the IV"    SOCIAL HISTORY:  Social History   Tobacco Use  . Smoking status: Never Smoker  . Smokeless tobacco: Never Used  Substance Use Topics  . Alcohol use: No    FAMILY HISTORY: Family History  Problem Relation Age of Onset  . Cystic fibrosis Mother   . Hypertension Father   .  Diabetes Brother   . Hypertension Maternal Grandmother     EXAM: BP 139/61 (BP Location: Right Arm)   Pulse (!) 110   Temp 98.1 F (36.7 C) (Rectal)   Resp (!) 28   Ht 5' 2"  (1.575 m)   Wt 72 kg   SpO2 100%   BMI 29.03 kg/m  CONSTITUTIONAL: Alert and oriented to person only.  Does not answer questions appropriately.  Seems very lethargic.  Appears acutely ill.  She is afebrile.  Rectal temp of 98.1. HEAD: Normocephalic EYES: Conjunctivae clear, pupils appear equal, EOMI ENT: normal nose; moist mucous membranes NECK: Supple, no meningismus, no nuchal rigidity, no LAD  CARD: Regular and tachycardic; S1 and S2 appreciated; no murmurs, no clicks, no rubs, no gallops RESP: Patient is tachypneic.  No hypoxia.  Lungs clear to auscultation with equal breath sounds bilaterally.  No rhonchi wheezing or rales. ABD/GI: Normal bowel sounds; non-distended; soft, diffusely tender throughout the abdomen without guarding or rebound BACK:  The back appears normal and is non-tender to palpation, there is no CVA tenderness EXT: Diffusely tender throughout bilateral lower extremities without edema.  2+ DP and radial pulses bilaterally.  No deformity noted to her extremities.  No ecchymosis.  No redness or warmth.  Compartments are soft. SKIN: Normal color for age and race; warm; no rash NEURO: Moves all extremities equally PSYCH: Patient is very drowsy, lethargic.  MEDICAL DECISION MAKING: Patient here with likely DKA.  She is tachypneic, tachycardic and has altered mental status.  We are having a difficult time placing a peripheral IV so a right IJ central line was placed emergently.  Patient getting IV fluids and insulin drip.  She will need admission.  ED PROGRESS: Labs show pH of 7.3.  Bicarb 13.  Anion gap 19.  Potassium 5.1.  Lactate 2.9.  Doubt sepsis.  I think she is dehydrated.  She is receiving 3 L of lactated Ringer's and IV insulin.  Vitals improving.  Patient given Zofran and then Phenergan  for nausea.  Given fentanyl for pain.  Her mental status seems to be improving.  Will discuss with medicine for admission.  3:07 AM Discussed patient's case with hospitalist, Dr. Hal Hope.  I have recommended admission and patient (and family if present) agree with this plan. Admitting physician will place admission orders.   I reviewed all nursing notes, vitals, pertinent previous records, EKGs, lab and urine results, imaging (as available).  It was very difficult to obtain peripheral access on patient as well as central line.  It appears she is here frequently for DKA.  I have discussed this with the hospitalist that she may need permanent access placed such as a port.  CRITICAL CARE Performed by: Pryor Curia   Total critical care time: 75 minutes  Critical care time was exclusive of separately billable procedures and treating other patients.  Critical care was necessary to treat or prevent imminent or life-threatening deterioration.  Critical care was time spent personally by me on the following activities: development of treatment plan with patient and/or surrogate as well as nursing, discussions with consultants, evaluation of patient's response to treatment, examination of patient, obtaining history from patient or surrogate, ordering and performing treatments and interventions, ordering and review of laboratory studies, ordering and review of radiographic studies, pulse oximetry and re-evaluation of patient's condition.    EKG Interpretation  Date/Time:  Monday December 22 2018 01:44:03 EDT Ventricular Rate:  110 PR Interval:    QRS Duration: 91 QT Interval:  342 QTC Calculation: 463 R Axis:   -50 Text Interpretation:  Sinus or ectopic atrial tachycardia Atrial premature complex Left anterior fascicular block Probable lateral infarct, age indeterminate Anterior infarct, old No significant change since last tracing Confirmed by Ward, Cyril Mourning [phone]) on 12/22/2018 2:30:02 AM         .Linus Salmons Line  Date/Time: 12/22/2018 2:33 AM Performed by: Ward, Delice Bison, DO Authorized by: Ward, Delice Bison, DO   Consent:    Consent obtained:  Emergent situation   Consent given by:  Patient   Risks discussed:  Arterial puncture, incorrect placement, nerve damage, pneumothorax, infection and bleeding   Alternatives discussed:  Alternative treatment (IO) Pre-procedure details:    Hand hygiene: Hand hygiene performed prior to insertion     Sterile barrier technique: All elements of maximal sterile technique followed     Skin preparation:  2% chlorhexidine   Skin preparation agent: Skin preparation agent completely dried prior to procedure   Anesthesia (see MAR for exact dosages):    Anesthesia method:  Local infiltration   Local anesthetic:  Lidocaine 1% w/o epi Procedure details:    Location:  L internal jugular   Patient position:  Trendelenburg   Procedural supplies:  Triple lumen   Catheter size:  7.5 Fr   Landmarks identified: yes     Ultrasound guidance: yes     Sterile ultrasound techniques: Sterile gel and sterile probe covers were used     Number of attempts:  1   Successful placement: yes   Post-procedure details:    Post-procedure:  Dressing applied and line sutured   Assessment:  Blood return through all ports, free fluid flow, no pneumothorax on x-ray and placement verified by x-ray   Patient tolerance of procedure:  Tolerated well, no immediate complications      Ward, Delice Bison, DO 12/22/18 0308

## 2018-12-22 NOTE — Progress Notes (Signed)
Pt was admitted from ED per stretcher accompanied with nurse tech on arrival to the floor pt was alert and oriented but was in a state of having tardive dyskinesia on call admission doctor Rozann Lesches was notified came to review pt with no new order given, lactic acid high on call physician notified, pt complaining of nausia  already given iv phenergan and zofran, at ED which seams not helping with her nausea another dose of iv zofran given, pt on glucose stabilizer for DKA, skin assessment done has old healed scar at the chest and rt thigh from previous surgery, prescribed orders carried out will continue to monitor pt

## 2018-12-22 NOTE — ED Triage Notes (Signed)
Pt arrivced via EMS from home with c/o n/v/d x 1 day, pt d/c on 6/24 from [facility] for DKA.  Pt reports she may have not been taking Insulin as she should have been, pt reports she lives with sister but has no help with meds.

## 2018-12-23 ENCOUNTER — Other Ambulatory Visit: Payer: Self-pay

## 2018-12-23 DIAGNOSIS — R6510 Systemic inflammatory response syndrome (SIRS) of non-infectious origin without acute organ dysfunction: Secondary | ICD-10-CM

## 2018-12-23 LAB — GLUCOSE, CAPILLARY
Glucose-Capillary: 268 mg/dL — ABNORMAL HIGH (ref 70–99)
Glucose-Capillary: 136 mg/dL — ABNORMAL HIGH (ref 70–99)
Glucose-Capillary: 45 mg/dL — ABNORMAL LOW (ref 70–99)
Glucose-Capillary: 47 mg/dL — ABNORMAL LOW (ref 70–99)
Glucose-Capillary: 44 mg/dL — CL (ref 70–99)
Glucose-Capillary: 63 mg/dL — ABNORMAL LOW (ref 70–99)
Glucose-Capillary: 228 mg/dL — ABNORMAL HIGH (ref 70–99)

## 2018-12-23 LAB — CBC
HCT: 29.8 % — ABNORMAL LOW (ref 36.0–46.0)
Hemoglobin: 9.4 g/dL — ABNORMAL LOW (ref 12.0–15.0)
MCH: 28.1 pg (ref 26.0–34.0)
MCHC: 31.5 g/dL (ref 30.0–36.0)
MCV: 89.2 fL (ref 80.0–100.0)
Platelets: 336 10*3/uL (ref 150–400)
RBC: 3.34 MIL/uL — ABNORMAL LOW (ref 3.87–5.11)
RDW: 13.9 % (ref 11.5–15.5)
WBC: 7.9 10*3/uL (ref 4.0–10.5)
nRBC: 0 % (ref 0.0–0.2)

## 2018-12-23 LAB — URINE CULTURE: Culture: NO GROWTH

## 2018-12-23 LAB — NOVEL CORONAVIRUS, NAA (HOSP ORDER, SEND-OUT TO REF LAB; TAT 18-24 HRS): SARS-CoV-2, NAA: NOT DETECTED

## 2018-12-23 LAB — GASTROINTESTINAL PANEL BY PCR, STOOL (REPLACES STOOL CULTURE)

## 2018-12-23 LAB — BASIC METABOLIC PANEL
Anion gap: 7 (ref 5–15)
BUN: 7 mg/dL (ref 6–20)
CO2: 19 mmol/L — ABNORMAL LOW (ref 22–32)
Calcium: 8.1 mg/dL — ABNORMAL LOW (ref 8.9–10.3)
Chloride: 110 mmol/L (ref 98–111)
Creatinine, Ser: 1.12 mg/dL — ABNORMAL HIGH (ref 0.44–1.00)
GFR calc Af Amer: >60 mL/min (ref >60)
GFR calc non Af Amer: 57 mL/min — ABNORMAL LOW (ref >60)
Glucose, Bld: 294 mg/dL — ABNORMAL HIGH (ref 70–99)
Potassium: 4.1 mmol/L (ref 3.5–5.1)
Sodium: 136 mmol/L (ref 135–145)

## 2018-12-23 LAB — C DIFFICILE QUICK SCREEN W PCR REFLEX
C Diff antigen: NEGATIVE
C Diff interpretation: NOT DETECTED
C Diff toxin: NEGATIVE

## 2018-12-23 LAB — MAGNESIUM: Magnesium: 1.7 mg/dL (ref 1.7–2.4)

## 2018-12-23 MED ORDER — LOPERAMIDE HCL 2 MG PO CAPS
2.0000 mg | ORAL_CAPSULE | ORAL | Status: DC | PRN
  Administered 2018-12-23 – 2018-12-24 (×5): 2 mg via ORAL
  Filled 2018-12-23 (×5): qty 1

## 2018-12-23 MED ORDER — INSULIN GLARGINE 100 UNIT/ML ~~LOC~~ SOLN
15.0000 [IU] | Freq: Every day | SUBCUTANEOUS | Status: DC
  Administered 2018-12-23 – 2018-12-24 (×2): 15 [IU] via SUBCUTANEOUS
  Filled 2018-12-23 (×3): qty 0.15

## 2018-12-23 MED ORDER — MAGNESIUM SULFATE IN D5W 1-5 GM/100ML-% IV SOLN
1.0000 g | Freq: Once | INTRAVENOUS | Status: AC
  Administered 2018-12-23: 1 g via INTRAVENOUS
  Filled 2018-12-23: qty 100

## 2018-12-23 MED ORDER — INSULIN GLARGINE 100 UNIT/ML ~~LOC~~ SOLN
15.0000 [IU] | Freq: Every day | SUBCUTANEOUS | Status: DC
  Filled 2018-12-23: qty 0.15

## 2018-12-23 NOTE — Progress Notes (Signed)
Patient does not wish for me to call and update any family members.

## 2018-12-23 NOTE — Plan of Care (Signed)

## 2018-12-23 NOTE — Progress Notes (Signed)
PROGRESS NOTE  [name] XWR:[id number] DOB: April 29, 1966 DOA: 12/22/2018 PCP: [name], MD   LOS: 1 day   Patient is from: Home  Brief Narrative / Interim history: 53 year old female with history of DM-1, tardive dyskinesia secondary to Reglan, CAD status post CABG, depression, severe gastroparesis on Mestinon admitted with DKA and GI symptoms.  Patient was hospitalized 6/22-6/24 for DKA and discharged on Lantus 15 units and SSI 0 to 12 units.  Reports good compliance with her medication.  Endorses UTI symptoms.  Started on insulin drip and IV fluids and transitioned to subcu insulin.  Subjective: Patient had multiple episodes of watery diarrhea overnight.  Continues to endorse nausea but no emesis.  Denies abdominal pain.  Dysuria improved.  Denies chest pain or dyspnea.  Objective: Vitals:   12/22/18 0635 12/22/18 2216 12/23/18 0112 12/23/18 0505  BP:  135/67 (!) 146/61 (!) 145/69  Pulse:  92 72 74  Resp:   17 17  Temp: 97.8 F (36.6 C) 98.2 F (36.8 C) 98.9 F (37.2 C) 98.4 F (36.9 C)  TempSrc: Oral  Oral Oral  SpO2:  100% 100% 100%  Weight:      Height:        Intake/Output Summary (Last 24 hours) at 12/23/2018 1442 Last data filed at 12/23/2018 1423 Gross per 24 hour  Intake 4266.05 ml  Output -  Net 4266.05 ml   Filed Weights   12/22/18 0247  Weight: 72 kg    Examination:  GENERAL: No acute distress.  Appears well.  HEENT: MMM.  Vision and hearing grossly intact.  NECK: Supple.  No apparent JVD. LUNGS:  No IWOB. Good air movement bilaterally. HEART:  RRR. Heart sounds normal.  ABD: Bowel sounds present. Soft. Non tender.  MSK/EXT:  Moves all extremities. No apparent deformity. No edema bilaterally.  SKIN: no apparent skin lesion or wound NEURO: Awake, alert and oriented appropriately.  No gross deficit.  PSYCH: Calm. Normal affect.  I have personally reviewed the following labs and images:  Radiology Studies: No results found.   Microbiology: Recent Results (from the past 240 hour(s))  SARS Coronavirus 2 (CEPHEID- Performed in [place] hospital lab), Hosp Order     Status: None   Collection Time: 12/15/18  8:23 AM   Specimen: Nasopharyngeal Swab  Result Value Ref Range Status   SARS Coronavirus 2 NEGATIVE NEGATIVE Final    Comment: (NOTE) If result is NEGATIVE SARS-CoV-2 target nucleic acids are NOT DETECTED. The SARS-CoV-2 RNA is generally detectable in upper and lower  respiratory specimens during the acute phase of infection. The lowest  concentration of SARS-CoV-2 viral copies this assay can detect is 250  copies / mL. A negative result does not preclude SARS-CoV-2 infection  and should not be used as the sole basis for treatment or other  patient management decisions.  A negative result may occur with  improper specimen collection / handling, submission of specimen other  than nasopharyngeal swab, presence of viral mutation(s) within the  areas targeted by this assay, and inadequate number of viral copies  (<250 copies / mL). A negative result must be combined with clinical  observations, patient history, and epidemiological information. If result is POSITIVE SARS-CoV-2 target nucleic acids are DETECTED. The SARS-CoV-2 RNA is generally detectable in upper and lower  respiratory specimens dur ing the acute phase of infection.  Positive  results are indicative of active infection with SARS-CoV-2.  Clinical  correlation with patient history and other diagnostic information is  necessary  to determine patient infection status.  Positive results do  not rule out bacterial infection or co-infection with other viruses. If result is PRESUMPTIVE POSTIVE SARS-CoV-2 nucleic acids MAY BE PRESENT.   A presumptive positive result was obtained on the submitted specimen  and confirmed on repeat testing.  While 2019 novel coronavirus  (SARS-CoV-2) nucleic acids may be present in the submitted sample  additional  confirmatory testing may be necessary for epidemiological  and / or clinical management purposes  to differentiate between  SARS-CoV-2 and other Sarbecovirus currently known to infect humans.  If clinically indicated additional testing with an alternate test  methodology [phone]) is advised. The SARS-CoV-2 RNA is generally  detectable in upper and lower respiratory sp ecimens during the acute  phase of infection. The expected result is Negative. Fact Sheet for Patients:  StrictlyIdeas.no Fact Sheet for Healthcare Providers: BankingDealers.co.za This test is not yet approved or cleared by the [place] FDA and has been authorized for detection and/or diagnosis of SARS-CoV-2 by FDA under an Emergency Use Authorization (EUA).  This EUA will remain in effect (meaning this test can be used) for the duration of the COVID-19 declaration under Section 564(b)(1) of the Act, 21 U.S.C. section 360bbb-3(b)(1), unless the authorization is terminated or revoked sooner. Performed at [facility], [place] [address] [place], [place] [address]   MRSA PCR Screening     Status: None   Collection Time: 12/15/18  4:40 PM   Specimen: Nasopharyngeal  Result Value Ref Range Status   MRSA by PCR NEGATIVE NEGATIVE Final    Comment:        The GeneXpert MRSA Assay (FDA approved for NASAL specimens only), is one component of a comprehensive MRSA colonization surveillance program. It is not intended to diagnose MRSA infection nor to guide or monitor treatment for MRSA infections. Performed at [facility], [place] [address], [place], [place] [address]   Novel Coronavirus,NAA,(SEND-OUT TO REF LAB - TAT 24-48 hrs); Hosp Order     Status: None   Collection Time: 12/22/18  3:09 AM   Specimen: Nasopharyngeal Swab; Respiratory  Result Value Ref Range Status   SARS-CoV-2, NAA NOT DETECTED NOT DETECTED Final    Comment: (NOTE)  This test was developed and its performance characteristics determined by [employer]. This test has not been FDA cleared or approved. This test has been authorized by FDA under an Emergency Use Authorization (EUA). This test is only authorized for the duration of time the declaration that circumstances exist justifying the authorization of the emergency use of in vitro diagnostic tests for detection of SARS-CoV-2 virus and/or diagnosis of COVID-19 infection under section 564(b)(1) of the Act, 21 U.S.C. 505LZJ-6(B)(3), unless the authorization is terminated or revoked sooner. When diagnostic testing is negative, the possibility of a false negative result should be considered in the context of a patient's recent exposures and the presence of clinical signs and symptoms consistent with COVID-19. An individual without symptoms of COVID-19 and who is not shedding SARS-CoV-2 virus would expect to have a negative (not detected) result in this assay. Performed  At: [facility] [address] [place], [place] [id number] Rush Farmer MD OX:[id number]    Ong  Final    Comment: Performed at [facility], [place] [address] [place], [place] [address]  Culture, Urine     Status: None   Collection Time: 12/22/18  6:10 PM   Specimen: Urine, Random  Result Value Ref Range Status  Specimen Description URINE, RANDOM  Final   Special Requests NONE  Final   Culture   Final    NO GROWTH Performed at [facility], [place] [address], [place], [place] [address]    Report Status 12/23/2018 FINAL  Final  Culture, blood (routine x 2)     Status: None (Preliminary result)   Collection Time: 12/22/18  8:11 PM   Specimen: BLOOD  Result Value Ref Range Status   Specimen Description BLOOD SITE NOT SPECIFIED  Final   Special Requests   Final    BOTTLES DRAWN AEROBIC ONLY Blood Culture results may not be optimal due to an inadequate volume of blood  received in culture bottles   Culture   Final    NO GROWTH < 24 HOURS Performed at [facility], [place] [address] [place], [place] [address]    Report Status PENDING  Incomplete  Culture, blood (routine x 2)     Status: None (Preliminary result)   Collection Time: 12/22/18  9:43 PM   Specimen: BLOOD LEFT HAND  Result Value Ref Range Status   Specimen Description BLOOD LEFT HAND  Final   Special Requests   Final    BOTTLES DRAWN AEROBIC ONLY Blood Culture results may not be optimal due to an inadequate volume of blood received in culture bottles   Culture   Final    NO GROWTH < 24 HOURS Performed at [facility], [place] [address] [place], [place] [address]    Report Status PENDING  Incomplete  C difficile quick scan w PCR reflex     Status: None   Collection Time: 12/23/18 12:14 AM   Specimen: STOOL  Result Value Ref Range Status   C Diff antigen NEGATIVE NEGATIVE Final   C Diff toxin NEGATIVE NEGATIVE Final   C Diff interpretation No C. difficile detected.  Final    Comment: Performed at [facility], [place] [address] [place], [place] [address]  Gastrointestinal Panel by PCR , Stool     Status: None   Collection Time: 12/23/18 12:14 AM   Specimen: Stool  Result Value Ref Range Status   Campylobacter species NOT DETECTED NOT DETECTED Final   Plesimonas shigelloides NOT DETECTED NOT DETECTED Final   Salmonella species NOT DETECTED NOT DETECTED Final   Yersinia enterocolitica NOT DETECTED NOT DETECTED Final   Vibrio species NOT DETECTED NOT DETECTED Final   Vibrio cholerae NOT DETECTED NOT DETECTED Final   Enteroaggregative E coli (EAEC) NOT DETECTED NOT DETECTED Final   Enteropathogenic E coli (EPEC) NOT DETECTED NOT DETECTED Final   Enterotoxigenic E coli (ETEC) NOT DETECTED NOT DETECTED Final   Shiga like toxin producing E coli (STEC) NOT DETECTED NOT DETECTED Final   Shigella/Enteroinvasive E coli (EIEC) NOT DETECTED NOT DETECTED Final   Cryptosporidium NOT  DETECTED NOT DETECTED Final   Cyclospora cayetanensis NOT DETECTED NOT DETECTED Final   Entamoeba histolytica NOT DETECTED NOT DETECTED Final   Giardia lamblia NOT DETECTED NOT DETECTED Final   Adenovirus F40/41 NOT DETECTED NOT DETECTED Final   Astrovirus NOT DETECTED NOT DETECTED Final   Norovirus GI/GII NOT DETECTED NOT DETECTED Final   Rotavirus A NOT DETECTED NOT DETECTED Final   Sapovirus (I, II, IV, and V) NOT DETECTED NOT DETECTED Final    Comment: Performed at [facility], [address], [place], [place] [address]    Sepsis Labs: Invalid input(s): PROCALCITONIN, LACTICIDVEN  Urine analysis:    Component Value  Date/Time   COLORURINE STRAW (A) 12/22/2018 [place] 12/22/2018 1820   LABSPEC 1.012 12/22/2018 1820   PHURINE 5.0 12/22/2018 1820   GLUCOSEU >=500 (A) 12/22/2018 1820   HGBUR SMALL (A) 12/22/2018 1820   BILIRUBINUR NEGATIVE 12/22/2018 1820   BILIRUBINUR negative 11/13/2018 1102   KETONESUR 20 (A) 12/22/2018 1820   PROTEINUR 30 (A) 12/22/2018 1820   UROBILINOGEN 0.2 11/13/2018 1102   UROBILINOGEN 0.2 04/27/2015 1608   NITRITE NEGATIVE 12/22/2018 1820   LEUKOCYTESUR NEGATIVE 12/22/2018 1820    Anemia Panel: No results for input(s): VITAMINB12, FOLATE, FERRITIN, TIBC, IRON, RETICCTPCT in the last 72 hours.  Thyroid Function Tests: No results for input(s): TSH, T4TOTAL, FREET4, T3FREE, THYROIDAB in the last 72 hours.  Lipid Profile: No results for input(s): CHOL, HDL, LDLCALC, TRIG, CHOLHDL, LDLDIRECT in the last 72 hours.  CBG: Recent Labs  Lab 12/22/18 1715 12/22/18 1830 12/22/18 1959 12/23/18 0806 12/23/18 1206  GLUCAP 201* 145* 124* 268* 136*    HbA1C: No results for input(s): HGBA1C in the last 72 hours.  BNP (last 3 results): No results for input(s): PROBNP in the last 8760 hours.  Cardiac Enzymes: No results for input(s): CKTOTAL, CKMB, CKMBINDEX, TROPONINI in the last 168 hours.  Coagulation Profile: No  results for input(s): INR, PROTIME in the last 168 hours.  Liver Function Tests: Recent Labs  Lab 12/22/18 0157  AST 17  ALT 18  ALKPHOS 81  BILITOT 1.7*  PROT 6.9  ALBUMIN 3.6   Recent Labs  Lab 12/22/18 0157  LIPASE 21   No results for input(s): AMMONIA in the last 168 hours.  Basic Metabolic Panel: Recent Labs  Lab 12/17/18 0730  12/22/18 0157 12/22/18 0202 12/22/18 1406 12/22/18 1845 12/23/18 0427  NA 136   < > 136 136 141 137 136  K 3.2*   < > 5.1 5.0 3.8 3.2* 4.1  CL 107  --  106  --  111 107 110  CO2 22  --  11*  --  21* 21* 19*  GLUCOSE 94  --  457*  --  171* 146* 294*  BUN 20  --  16  --  11 9 7   CREATININE 1.22*  --  1.58*  --  1.19* 1.13* 1.12*  CALCIUM 8.0*  --  9.1  --  9.0 8.6* 8.1*  MG 2.0  --   --   --  2.0 1.8 1.7   < > = values in this interval not displayed.   GFR: Estimated Creatinine Clearance: 55.3 mL/min (A) (by C-G formula based on SCr of 1.12 mg/dL (H)).  CBC: Recent Labs  Lab 12/17/18 0730 12/22/18 0137 12/22/18 0157 12/22/18 0202 12/23/18 0427  WBC 5.5  --  10.6*  --  7.9  NEUTROABS  --   --  9.4*  --   --   HGB 9.0* 10.9* 10.5* 10.5* 9.4*  HCT 29.0* 32.0* 34.4* 31.0* 29.8*  MCV 91.2  --  91.5  --  89.2  PLT 287  --  321  --  336    Procedures:  None  Microbiology: 6/29-urine cultures pending 6/29-COVID-19 pending  Assessment & Plan: DKA/DM-1: Unclear why she went into DKA.  Differentials include nonadherence (most likely), insulin underdose or dehydration from nausea/vomiting/diarrhea.  -Continue subcu insulin-Lantus, SSI -Continue IV fluids-poor p.o. intake due to nausea -Continue home Lyrica  Intractable nausea/vomiting/diarrhea: Gastroparesis?.  Lipase and LFT within normal range.  C. Difficile, GI panel, urine culture and blood culture negative.  Abdominal exam benign. -IV Zofran and Phenergan -History of third-degree with Reglan -Continue IV fluids -Start Imodium-unlikely infectious at this time.  SIRS:  Resolved.  Sepsis physiology resolved.  Likely due to DKA.  Cultures negative as above. -Discontinue antibiotics -IV hydration as above  Lactic acidosis: Due to the above.  Resolved.  History of CAD status post CABG, no cardiopulmonary symptoms. -Continue meds -Closely monitor respiratory status while on IV fluid  AKI: Likely prerenal due to GI loss and DKA. -IV fluid as above  Hypertension/tachycardia: BP in fair range.  Tachycardia resolved. -Increase metoprolol to 25 mg twice daily on 6/29 -Continue holding losartan and Lasix  Tardive dyskinesia due to Reglan: No symptoms today.  Suspect noncompliance. -Continue home Mestinon -Avoid Reglan or Compazine.  DVT prophylaxis: Subcu Lovenox Code Status: Full code Family Communication: Attempted to update patient's sister and son over the phone but no answer. Disposition Plan: Remains inpatient on IV fluids due to poor p.o. intake/nausea and frequent diarrhea. Consultants: None  Antimicrobials: Anti-infectives (From admission, onward)   Start     Dose/Rate Route Frequency Ordered Stop   12/22/18 1615  cefTRIAXone (ROCEPHIN) 1 g in sodium chloride 0.9 % 100 mL IVPB     1 g 200 mL/hr over 30 Minutes Intravenous Every 24 hours 12/22/18 1601        Sch Meds:  Scheduled Meds: . aspirin EC  81 mg Oral Daily  . busPIRone  10 mg Oral TID  . enoxaparin (LOVENOX) injection  40 mg Subcutaneous Q24H  . insulin aspart  0-20 Units Subcutaneous TID WC  . insulin aspart  0-5 Units Subcutaneous QHS  . insulin glargine  15 Units Subcutaneous Daily  . metoprolol tartrate  25 mg Oral BID  . pantoprazole  40 mg Oral Daily  . pregabalin  150 mg Oral BID  . pyridostigmine  60 mg Oral TID   Continuous Infusions: . 0.9 % NaCl with KCl 20 mEq / L 125 mL/hr at 12/23/18 1411  . cefTRIAXone (ROCEPHIN)  IV 1 g (12/22/18 1712)  . ondansetron (ZOFRAN) IV 8 mg (12/22/18 1149)   PRN Meds:.loperamide, ondansetron (ZOFRAN) IV, promethazine  Taye T.  [place]  If 7PM-7AM, please contact night-coverage [email or web address] Password [email or web address] 12/23/2018, 2:42 PM

## 2018-12-24 ENCOUNTER — Ambulatory Visit (HOSPITAL_COMMUNITY): Payer: Self-pay

## 2018-12-24 LAB — BASIC METABOLIC PANEL
Anion gap: 7 (ref 5–15)
BUN: 7 mg/dL (ref 6–20)
CO2: 17 mmol/L — ABNORMAL LOW (ref 22–32)
Calcium: 7.7 mg/dL — ABNORMAL LOW (ref 8.9–10.3)
Chloride: 113 mmol/L — ABNORMAL HIGH (ref 98–111)
Creatinine, Ser: 1.27 mg/dL — ABNORMAL HIGH (ref 0.44–1.00)
GFR calc Af Amer: 57 mL/min — ABNORMAL LOW (ref >60)
GFR calc non Af Amer: 49 mL/min — ABNORMAL LOW (ref >60)
Glucose, Bld: 127 mg/dL — ABNORMAL HIGH (ref 70–99)
Potassium: 4.1 mmol/L (ref 3.5–5.1)
Sodium: 137 mmol/L (ref 135–145)

## 2018-12-24 LAB — GLUCOSE, CAPILLARY
Glucose-Capillary: 136 mg/dL — ABNORMAL HIGH (ref 70–99)
Glucose-Capillary: 212 mg/dL — ABNORMAL HIGH (ref 70–99)
Glucose-Capillary: 82 mg/dL (ref 70–99)
Glucose-Capillary: 193 mg/dL — ABNORMAL HIGH (ref 70–99)

## 2018-12-24 LAB — MAGNESIUM: Magnesium: 1.9 mg/dL (ref 1.7–2.4)

## 2018-12-24 NOTE — Progress Notes (Signed)
Patient ID: [name], female   DOB: August 30, 1965, 53 y.o.   MRN: [id number]  PROGRESS NOTE    [name]  WUJ:[id number] DOB: 1966-02-13 DOA: 12/22/2018 PCP: [name], MD   Brief Narrative:  53 year old female with history of diabetes mellitus type 1, tardive dyskinesia secondary to Reglan, CAD status post CABG, depression, severe gastroparesis on Mestinon, recent hospitalization from 12/15/2018-12/17/2018 for DKA presented with nausea, vomiting and diarrhea on 12/22/2018.  She was found to be in DKA and started on insulin drip which was subsequently transitioned to long-acting insulin after anion gap closed.  Assessment & Plan:   Principal Problem:   DKA (diabetic ketoacidoses) ([place]) Active Problems:   AKI (acute kidney injury) ([place])   S/P CABG x 3   Acute encephalopathy  DKA in a patient with diabetes mellitus type 1 -Treated with insulin drip and subsequently transitioned to long-acting subcutaneous Lantus -Had episodes of hypoglycemia yesterday.  Monitor CBGs with SSI. -Patient still does not feel well with very poor appetite and some nausea with diarrhea. -Continue IV fluids but decrease to normal saline with IV potassium at 100 cc an hour. -Repeat a.m. labs  Intractable nausea/vomiting/diarrhea: Patient probably has gastroparesis as well -Improving.  Still having significant diarrhea but improving.  Stool for C. difficile/GI panel negative.  Use Imodium for diarrhea. -Continue Zofran and Phenergan.  Patient cannot tolerate Reglan and has history of tardive dyskinesia with Reglan -Advance diet as tolerated.  IV fluids As above  Acute metabolic encephalopathy -Probably from above.  Improved  SIRS: -From above.  Resolved.  No signs of sepsis.  Cultures negative.  Off antibiotics.  History of CAD status post CABG -Stable.  Outpatient follow-up with cardiology.  Continue metoprolol, aspirin  Hypertension -Blood pressure on the lower side.  Continue metoprolol.   Losartan and Lasix on hold  Acute kidney injury with metabolic acidosis -Improving.  Repeat a.m. labs.  Continue IV fluids  Tardive dyskinesia due to Reglan -Continue home Mestinon.  Avoid Reglan or Compazine.  Outpatient follow-up  Generalized deconditioning -PT eval   DVT prophylaxis: Lovenox Code Status: Full Family Communication: None at bedside Disposition Plan: Home tomorrow if clinically improves  Consultants: None  Procedures: None Antimicrobials:  Anti-infectives (From admission, onward)   Start     Dose/Rate Route Frequency Ordered Stop   12/22/18 1615  cefTRIAXone (ROCEPHIN) 1 g in sodium chloride 0.9 % 100 mL IVPB  Status:  Discontinued     1 g 200 mL/hr over 30 Minutes Intravenous Every 24 hours 12/22/18 1601 12/24/18 0800        Subjective: Patient seen and examined at bedside.  She still feels very weak and has some nausea.  Appetite still poor.  Still having significant diarrhea but slightly better after Imodium.  No overnight fever or vomiting.  Objective: Vitals:   12/23/18 0112 12/23/18 0505 12/23/18 2148 12/24/18 0559  BP: (!) 146/61 (!) 145/69 (!) 98/40 (!) 91/47  Pulse: 72 74 77 66  Resp: 17 17 16 16   Temp: 98.9 F (37.2 C) 98.4 F (36.9 C) 97.8 F (36.6 C) 98.3 F (36.8 C)  TempSrc: Oral Oral Oral Oral  SpO2: 100% 100% 100% 99%  Weight:      Height:        Intake/Output Summary (Last 24 hours) at 12/24/2018 1111 Last data filed at 12/24/2018 0900 Gross per 24 hour  Intake 2808.3 ml  Output -  Net 2808.3 ml   Filed Weights   12/22/18 0247  Weight: 72 kg  Examination:  General exam: Appears calm and comfortable.  Looks older than stated age. Respiratory system: Bilateral decreased breath sounds at bases Cardiovascular system: S1 & S2 heard, Rate controlled Gastrointestinal system: Abdomen is nondistended, soft and nontender. Normal bowel sounds heard. Extremities: No cyanosis, clubbing, edema     Data Reviewed: I have  personally reviewed following labs and imaging studies  CBC: Recent Labs  Lab 12/22/18 0137 12/22/18 0157 12/22/18 0202 12/23/18 0427  WBC  --  10.6*  --  7.9  NEUTROABS  --  9.4*  --   --   HGB 10.9* 10.5* 10.5* 9.4*  HCT 32.0* 34.4* 31.0* 29.8*  MCV  --  91.5  --  89.2  PLT  --  321  --  160   Basic Metabolic Panel: Recent Labs  Lab 12/22/18 0157 12/22/18 0202 12/22/18 1406 12/22/18 1845 12/23/18 0427 12/24/18 0431  NA 136 136 141 137 136 137  K 5.1 5.0 3.8 3.2* 4.1 4.1  CL 106  --  111 107 110 113*  CO2 11*  --  21* 21* 19* 17*  GLUCOSE 457*  --  171* 146* 294* 127*  BUN 16  --  11 9 7 7   CREATININE 1.58*  --  1.19* 1.13* 1.12* 1.27*  CALCIUM 9.1  --  9.0 8.6* 8.1* 7.7*  MG  --   --  2.0 1.8 1.7 1.9   GFR: Estimated Creatinine Clearance: 48.7 mL/min (A) (by C-G formula based on SCr of 1.27 mg/dL (H)). Liver Function Tests: Recent Labs  Lab 12/22/18 0157  AST 17  ALT 18  ALKPHOS 81  BILITOT 1.7*  PROT 6.9  ALBUMIN 3.6   Recent Labs  Lab 12/22/18 0157  LIPASE 21   No results for input(s): AMMONIA in the last 168 hours. Coagulation Profile: No results for input(s): INR, PROTIME in the last 168 hours. Cardiac Enzymes: No results for input(s): CKTOTAL, CKMB, CKMBINDEX, TROPONINI in the last 168 hours. BNP (last 3 results) No results for input(s): PROBNP in the last 8760 hours. HbA1C: No results for input(s): HGBA1C in the last 72 hours. CBG: Recent Labs  Lab 12/23/18 1712 12/23/18 1744 12/23/18 1813 12/23/18 2150 12/24/18 0834  GLUCAP 47* 44* 63* 228* 136*   Lipid Profile: No results for input(s): CHOL, HDL, LDLCALC, TRIG, CHOLHDL, LDLDIRECT in the last 72 hours. Thyroid Function Tests: No results for input(s): TSH, T4TOTAL, FREET4, T3FREE, THYROIDAB in the last 72 hours. Anemia Panel: No results for input(s): VITAMINB12, FOLATE, FERRITIN, TIBC, IRON, RETICCTPCT in the last 72 hours. Sepsis Labs: Recent Labs  Lab 12/22/18 0200 12/22/18  0459 12/22/18 1530 12/22/18 1845  LATICACIDVEN 2.9* 3.3* 1.2 1.6    Recent Results (from the past 240 hour(s))  SARS Coronavirus 2 (CEPHEID- Performed in [facility] hospital lab), Hosp Order     Status: None   Collection Time: 12/15/18  8:23 AM   Specimen: Nasopharyngeal Swab  Result Value Ref Range Status   SARS Coronavirus 2 NEGATIVE NEGATIVE Final    Comment: (NOTE) If result is NEGATIVE SARS-CoV-2 target nucleic acids are NOT DETECTED. The SARS-CoV-2 RNA is generally detectable in upper and lower  respiratory specimens during the acute phase of infection. The lowest  concentration of SARS-CoV-2 viral copies this assay can detect is 250  copies / mL. A negative result does not preclude SARS-CoV-2 infection  and should not be used as the sole basis for treatment or other  patient management decisions.  A negative result may occur with  improper specimen collection / handling, submission of specimen other  than nasopharyngeal swab, presence of viral mutation(s) within the  areas targeted by this assay, and inadequate number of viral copies  (<250 copies / mL). A negative result must be combined with clinical  observations, patient history, and epidemiological information. If result is POSITIVE SARS-CoV-2 target nucleic acids are DETECTED. The SARS-CoV-2 RNA is generally detectable in upper and lower  respiratory specimens dur ing the acute phase of infection.  Positive  results are indicative of active infection with SARS-CoV-2.  Clinical  correlation with patient history and other diagnostic information is  necessary to determine patient infection status.  Positive results do  not rule out bacterial infection or co-infection with other viruses. If result is PRESUMPTIVE POSTIVE SARS-CoV-2 nucleic acids MAY BE PRESENT.   A presumptive positive result was obtained on the submitted specimen  and confirmed on repeat testing.  While 2019 novel coronavirus  (SARS-CoV-2) nucleic  acids may be present in the submitted sample  additional confirmatory testing may be necessary for epidemiological  and / or clinical management purposes  to differentiate between  SARS-CoV-2 and other Sarbecovirus currently known to infect humans.  If clinically indicated additional testing with an alternate test  methodology [phone]) is advised. The SARS-CoV-2 RNA is generally  detectable in upper and lower respiratory sp ecimens during the acute  phase of infection. The expected result is Negative. Fact Sheet for Patients:  StrictlyIdeas.no Fact Sheet for Healthcare Providers: BankingDealers.co.za This test is not yet approved or cleared by the [place] FDA and has been authorized for detection and/or diagnosis of SARS-CoV-2 by FDA under an Emergency Use Authorization (EUA).  This EUA will remain in effect (meaning this test can be used) for the duration of the COVID-19 declaration under Section 564(b)(1) of the Act, 21 U.S.C. section 360bbb-3(b)(1), unless the authorization is terminated or revoked sooner. Performed at [facility], [place] [address] [place], [place] [address]   MRSA PCR Screening     Status: None   Collection Time: 12/15/18  4:40 PM   Specimen: Nasopharyngeal  Result Value Ref Range Status   MRSA by PCR NEGATIVE NEGATIVE Final    Comment:        The GeneXpert MRSA Assay (FDA approved for NASAL specimens only), is one component of a comprehensive MRSA colonization surveillance program. It is not intended to diagnose MRSA infection nor to guide or monitor treatment for MRSA infections. Performed at [facility], [place] [address], [place], [place] [address]   Novel Coronavirus,NAA,(SEND-OUT TO REF LAB - TAT 24-48 hrs); Hosp Order     Status: None   Collection Time: 12/22/18  3:09 AM   Specimen: Nasopharyngeal Swab; Respiratory  Result Value Ref Range Status    SARS-CoV-2, NAA NOT DETECTED NOT DETECTED Final    Comment: (NOTE) This test was developed and its performance characteristics determined by [employer]. This test has not been FDA cleared or approved. This test has been authorized by FDA under an Emergency Use Authorization (EUA). This test is only authorized for the duration of time the declaration that circumstances exist justifying the authorization of the emergency use of in vitro diagnostic tests for detection of SARS-CoV-2 virus and/or diagnosis of COVID-19 infection under section 564(b)(1) of the Act, 21 U.S.C. 308MVH-8(I)(6), unless the authorization is terminated or revoked sooner. When diagnostic testing is negative, the possibility of a false negative result should be considered in the context of a patient's recent exposures  and the presence of clinical signs and symptoms consistent with COVID-19. An individual without symptoms of COVID-19 and who is not shedding SARS-CoV-2 virus would expect to have a negative (not detected) result in this assay. Performed  At: [facility] [address] [place], [place] [id number] Rush Farmer MD DX:[id number]    [place]  Final    Comment: Performed at [facility], [place] [address], [place], [place] [address]  Culture, Urine     Status: None   Collection Time: 12/22/18  6:10 PM   Specimen: Urine, Random  Result Value Ref Range Status   Specimen Description URINE, RANDOM  Final   Special Requests NONE  Final   Culture   Final    NO GROWTH Performed at [facility], [place] [address], [place], [place] [address]    Report Status 12/23/2018 FINAL  Final  Culture, blood (routine x 2)     Status: None (Preliminary result)   Collection Time: 12/22/18  8:11 PM   Specimen: BLOOD  Result Value Ref Range Status   Specimen Description BLOOD SITE NOT SPECIFIED  Final   Special Requests   Final    BOTTLES DRAWN AEROBIC ONLY Blood Culture  results may not be optimal due to an inadequate volume of blood received in culture bottles   Culture   Final    NO GROWTH 2 DAYS Performed at [facility], [place] [address], [place], [place] [address]    Report Status PENDING  Incomplete  Culture, blood (routine x 2)     Status: None (Preliminary result)   Collection Time: 12/22/18  9:43 PM   Specimen: BLOOD LEFT HAND  Result Value Ref Range Status   Specimen Description BLOOD LEFT HAND  Final   Special Requests   Final    BOTTLES DRAWN AEROBIC ONLY Blood Culture results may not be optimal due to an inadequate volume of blood received in culture bottles   Culture   Final    NO GROWTH 2 DAYS Performed at [facility], [place] [address] [place], [place] [address]    Report Status PENDING  Incomplete  C difficile quick scan w PCR reflex     Status: None   Collection Time: 12/23/18 12:14 AM   Specimen: STOOL  Result Value Ref Range Status   C Diff antigen NEGATIVE NEGATIVE Final   C Diff toxin NEGATIVE NEGATIVE Final   C Diff interpretation No C. difficile detected.  Final    Comment: Performed at [facility], [place] [address], [place], [place] [address]  Gastrointestinal Panel by PCR , Stool     Status: None   Collection Time: 12/23/18 12:14 AM   Specimen: Stool  Result Value Ref Range Status   Campylobacter species NOT DETECTED NOT DETECTED Final   Plesimonas shigelloides NOT DETECTED NOT DETECTED Final   Salmonella species NOT DETECTED NOT DETECTED Final   Yersinia enterocolitica NOT DETECTED NOT DETECTED Final   Vibrio species NOT DETECTED NOT DETECTED Final   Vibrio cholerae NOT DETECTED NOT DETECTED Final   Enteroaggregative E coli (EAEC) NOT DETECTED NOT DETECTED Final   Enteropathogenic E coli (EPEC) NOT DETECTED NOT DETECTED Final   Enterotoxigenic E coli (ETEC) NOT DETECTED NOT DETECTED Final   Shiga like toxin producing E coli (STEC) NOT DETECTED NOT DETECTED Final   Shigella/Enteroinvasive E coli (EIEC)  NOT DETECTED NOT DETECTED Final   Cryptosporidium NOT DETECTED NOT DETECTED Final   Cyclospora  cayetanensis NOT DETECTED NOT DETECTED Final   Entamoeba histolytica NOT DETECTED NOT DETECTED Final   Giardia lamblia NOT DETECTED NOT DETECTED Final   Adenovirus F40/41 NOT DETECTED NOT DETECTED Final   Astrovirus NOT DETECTED NOT DETECTED Final   Norovirus GI/GII NOT DETECTED NOT DETECTED Final   Rotavirus A NOT DETECTED NOT DETECTED Final   Sapovirus (I, II, IV, and V) NOT DETECTED NOT DETECTED Final    Comment: Performed at [facility], [address], [place], [place] [address]         Radiology Studies: No results found.      Scheduled Meds: . aspirin EC  81 mg Oral Daily  . busPIRone  10 mg Oral TID  . enoxaparin (LOVENOX) injection  40 mg Subcutaneous Q24H  . insulin aspart  0-20 Units Subcutaneous TID WC  . insulin aspart  0-5 Units Subcutaneous QHS  . insulin glargine  15 Units Subcutaneous Daily  . metoprolol tartrate  25 mg Oral BID  . pantoprazole  40 mg Oral Daily  . pregabalin  150 mg Oral BID  . pyridostigmine  60 mg Oral TID   Continuous Infusions: . 0.9 % NaCl with KCl 20 mEq / L 125 mL/hr at 12/24/18 0746  . ondansetron (ZOFRAN) IV 8 mg (12/22/18 1149)     LOS: 2 days        Aline August, MD [facility] 12/24/2018, 11:11 AM

## 2018-12-24 NOTE — Evaluation (Signed)
Occupational Therapy Evaluation Patient Details Name: [name] MRN: [id number] DOB: 07-Mar-1966 Today's Date: 12/24/2018    History of Present Illness 53 year old female with history of diabetes mellitus type 1, tardive dyskinesia secondary to Reglan, CAD status post CABG, depression, severe gastroparesis on Mestinon, recent hospitalization from 12/15/2018-12/17/2018 for DKA presented with nausea, vomiting and diarrhea on 12/22/2018.  She was found to be in DKA and started on insulin drip which was subsequently transitioned to long-acting insulin after anion gap closed.   Clinical Impression   This 53 y/o female presents with the above. PTA pt reports she is independent with ADL and functional mobility, but does report during her "episodes of gastroparesis" she becomes significantly weak and has increased difficulty performing ADL/mobility tasks. Pt performing functional mobility this session using RW with minA; she currently requires minA for LB and toileting ADL, setup/supervision for seated UB ADL and minguard assist for standing grooming ADL. Pt mostly limited due to weakness, decreased activity tolerance at this time. Discussed with pt option of ST rehab services after discharge and pt with preference to return home (vs SNF) due to concerns with COVID in nursing facilities. She will benefit from continued acute OT services and recommend follow up [facility] services after dishcarge to maximize her safety and independence with ADL and mobility. Will follow.     Follow Up Recommendations  Home health OT;Other (comment)(may benefit from [facility] aide)    Equipment Recommendations  3 in 1 bedside commode           Precautions / Restrictions Precautions Precautions: Fall Restrictions Weight Bearing Restrictions: No      Mobility Bed Mobility Overal bed mobility: Needs Assistance Bed Mobility: Supine to Sit     Supine to sit: Min guard;HOB elevated     General bed mobility comments: for  lines, safety; no physical assist required  Transfers Overall transfer level: Needs assistance Equipment used: Rolling walker (2 wheeled) Transfers: Sit to/from Stand Sit to Stand: Min assist         General transfer comment: assist to rise and steady, VCs hand placement    Balance                                           ADL either performed or assessed with clinical judgement   ADL Overall ADL's : Needs assistance/impaired Eating/Feeding: Modified independent;Sitting   Grooming: Wash/dry hands;Min guard;Minimal assistance;Standing   Upper Body Bathing: Set up;Min guard;Sitting   Lower Body Bathing: Minimal assistance;Sit to/from stand Lower Body Bathing Details (indicate cue type and reason): pt standing at sink to wash peri-area during this session Upper Body Dressing : Set up;Sitting   Lower Body Dressing: Minimal assistance;Sit to/from stand Lower Body Dressing Details (indicate cue type and reason): pt required light minA to thread mesh underwear over LEs, able to maintain standing balance to advance over hips without difficulty Toilet Transfer: Minimal assistance;Ambulation;Comfort height toilet;Grab bars;RW   Toileting- Clothing Manipulation and Hygiene: Minimal assistance;Sit to/from stand Toileting - Clothing Manipulation Details (indicate cue type and reason): steadying assist in standing while pt performs pericare and clothing management     Functional mobility during ADLs: Minimal assistance;Rolling walker General ADL Comments: pt mostly limited due to weakness at this time                         Pertinent Vitals/Pain Pain  Assessment: Faces Faces Pain Scale: Hurts little more Pain Location: back with prolonged standing activity Pain Descriptors / Indicators: Discomfort;Sore Pain Intervention(s): Limited activity within patient's tolerance;Monitored during session     Hand Dominance Right   Extremity/Trunk Assessment Upper  Extremity Assessment Upper Extremity Assessment: Generalized weakness   Lower Extremity Assessment Lower Extremity Assessment: Defer to PT evaluation       Communication Communication Communication: No difficulties   Cognition Arousal/Alertness: Awake/alert Behavior During Therapy: WFL for tasks assessed/performed Overall Cognitive Status: Within Functional Limits for tasks assessed                                     General Comments       Exercises     Shoulder Instructions      Home Living Family/patient expects to be discharged to:: Private residence Living Arrangements: Other relatives(sister) Available Help at Discharge: Family;Available PRN/intermittently Type of Home: Apartment Home Access: Stairs to enter Entrance Stairs-Number of Steps: 1 flight, approx 12 Entrance Stairs-Rails: Right Home Layout: One level     Bathroom Shower/Tub: Corporate investment banker: Standard     Home Equipment: Grab bars - tub/shower          Prior Functioning/Environment Level of Independence: Independent        Comments: pt reports when she has her episodes of gastroparesis she has increased difficulty with mobility/ADL tasks; pt reports sister isn't much of the "caregiver type"         OT Problem List: Decreased strength;Decreased range of motion;Decreased activity tolerance;Impaired balance (sitting and/or standing);Decreased knowledge of use of DME or AE      OT Treatment/Interventions: Self-care/ADL training;Therapeutic exercise;Neuromuscular education;DME and/or AE instruction;Patient/family education;Balance training;Therapeutic activities    OT Goals(Current goals can be found in the care plan section) Acute Rehab OT Goals Patient Stated Goal: remain safe and independent at home OT Goal Formulation: With patient Time For Goal Achievement: 01/07/19 Potential to Achieve Goals: Good  OT Frequency: Min 2X/week   Barriers to D/C:             Co-evaluation              AM-PAC OT "6 Clicks" Daily Activity     Outcome Measure Help from another person eating meals?: None Help from another person taking care of personal grooming?: None Help from another person toileting, which includes using toliet, bedpan, or urinal?: A Little Help from another person bathing (including washing, rinsing, drying)?: A Little Help from another person to put on and taking off regular upper body clothing?: None Help from another person to put on and taking off regular lower body clothing?: A Little 6 Click Score: 21   End of Session Equipment Utilized During Treatment: Gait belt;Rolling walker Nurse Communication: Mobility status  Activity Tolerance: Patient tolerated treatment well Patient left: in chair;with call bell/phone within reach;with chair alarm set  OT Visit Diagnosis: Unsteadiness on feet (R26.81);Muscle weakness (generalized) (M62.81)                Time: 1126-1208 OT Time Calculation (min): 42 min Charges:  OT General Charges $OT Visit: 1 Visit OT Evaluation $OT Eval Moderate Complexity: 1 Mod OT Treatments $Self Care/Home Management : 23-37 mins   Lou Cal, OT Supplemental Rehabilitation Services Pager [phone] Office [phone]   Raymondo Band 12/24/2018, 12:53 PM

## 2018-12-24 NOTE — Progress Notes (Signed)
12/24/18 1450  PT Visit Information  Last PT Received On 12/24/18  Assistance Needed +1  History of Present Illness 53 year old female with history of diabetes mellitus type 1, tardive dyskinesia secondary to Reglan, CAD status post CABG, depression, severe gastroparesis on Mestinon, recent hospitalization from 12/15/2018-12/17/2018 for DKA presented with nausea, vomiting and diarrhea on 12/22/2018.  She was found to be in DKA and started on insulin drip which was subsequently transitioned to long-acting insulin after anion gap closed.  Precautions  Precautions Fall  Restrictions  Weight Bearing Restrictions No  Home Living  Family/patient expects to be discharged to: Private residence  Living Arrangements Other relatives (sister)  Available Help at Discharge Family;Available PRN/intermittently  Type of Home Apartment  Home Access Stairs to enter  Entrance Stairs-Number of Steps 1 flight, approx 12  Entrance Stairs-Rails Right;Left  Home Layout One level  Bathroom Shower/Tub Tub/shower unit;Curtain  Automotive engineer Grab bars - tub/shower  Prior Function  Level of Independence Independent  Comments pt reports when she has her episodes of gastroparesis she has increased difficulty with mobility/ADL tasks; pt reports sister isn't much of the "caregiver type"   Communication  Communication No difficulties  Pain Assessment  Pain Assessment Faces  Faces Pain Scale 0  Cognition  Arousal/Alertness Awake/alert  Behavior During Therapy WFL for tasks assessed/performed  Overall Cognitive Status Within Functional Limits for tasks assessed  Upper Extremity Assessment  Upper Extremity Assessment Defer to OT evaluation  Lower Extremity Assessment  Lower Extremity Assessment Generalized weakness  Cervical / Trunk Assessment  Cervical / Trunk Assessment Normal  Bed Mobility  General bed mobility comments In chair upon entry.   Transfers  Overall transfer level Needs  assistance  Equipment used None  Transfers Sit to/from Stand  Sit to Stand Min guard  General transfer comment Min guard for safety.   Ambulation/Gait  Ambulation/Gait assistance Min guard  Gait Distance (Feet) 120 Feet  Assistive device IV Pole  Gait Pattern/deviations Step-through pattern;Decreased stride length  General Gait Details Slow, cautious gait, however, overall steady. Pt asking to hold onto IV pole for increased comfort. No overt LOB noted.   Gait velocity Decreased   Balance  Overall balance assessment Needs assistance  Sitting-balance support No upper extremity supported;Feet supported  Sitting balance-Leahy Scale Good  Standing balance support No upper extremity supported;During functional activity;Single extremity supported  Standing balance-Leahy Scale Fair  Standing balance comment Able to maintain static standing without UE support   General Comments  General comments (skin integrity, edema, etc.) Pt reports difficulty with managing her insulin at home, especially when she is sick.   PT - End of Session  Equipment Utilized During Treatment Gait belt  Activity Tolerance Patient tolerated treatment well  Patient left in chair;with call bell/phone within reach;with chair alarm set  Nurse Communication Mobility status  PT Assessment  PT Recommendation/Assessment Patient needs continued PT services  PT Visit Diagnosis Other abnormalities of gait and mobility (R26.89)  PT Problem List Decreased strength;Decreased mobility;Decreased knowledge of use of DME;Decreased balance  Barriers to Discharge Decreased caregiver support  PT Plan  PT Frequency (ACUTE ONLY) Min 3X/week  PT Treatment/Interventions (ACUTE ONLY) Gait training;DME instruction;Stair training;Functional mobility training;Therapeutic activities;Therapeutic exercise;Balance training;Patient/family education  AM-PAC PT "6 Clicks" Mobility Outcome Measure (Version 2)  Help needed turning from your back to your  side while in a flat bed without using bedrails? 4  Help needed moving from lying on your back to sitting on the side  of a flat bed without using bedrails? 3  Help needed moving to and from a bed to a chair (including a wheelchair)? 3  Help needed standing up from a chair using your arms (e.g., wheelchair or bedside chair)? 3  Help needed to walk in hospital room? 3  Help needed climbing 3-5 steps with a railing?  3  6 Click Score 19  Consider Recommendation of Discharge To: Home with [facility]  PT Recommendation  Follow Up Recommendations Home health PT [facility])  PT equipment Cane  Individuals Consulted  Consulted and Agree with Results and Recommendations Patient  Acute Rehab PT Goals  Patient Stated Goal remain safe and independent at home  PT Goal Formulation With patient  Time For Goal Achievement 01/07/19  Potential to Achieve Goals Good  PT Time Calculation  PT Start Time (ACUTE ONLY) 1339  PT Stop Time (ACUTE ONLY) 1355  PT Time Calculation (min) (ACUTE ONLY) 16 min  PT General Charges  $$ ACUTE PT VISIT 1 Visit  PT Evaluation  $PT Eval Low Complexity 1 Low  Written Expression  Dominant Hand Right   Pt admitted with problem above and deficits above. Pt requiring min guard A for gait with use of IV pole. Pt reports feeling more comfortable having something to hold onto during gait, so recommend cane for home. Pt may also benefit from [facility] as she reports some difficulty managing insulin at home at times. Will continue to follow acutely to maximize functional mobility independence and safety.   Leighton Ruff, PT, DPT  Acute Rehabilitation Services  Pager: [phone] Office: [phone]

## 2018-12-24 NOTE — Progress Notes (Signed)
Inpatient Diabetes Program Recommendations  AACE/ADA: New Consensus Statement on Inpatient Glycemic Control (2015)  Target Ranges:  Prepandial:   less than 140 mg/dL      Peak postprandial:   less than 180 mg/dL (1-2 hours)      Critically ill patients:  140 - 180 mg/dL   Results for BIRGIT, NOWLING (MRN [id number]) as of 12/24/2018 11:53  Ref. Range 12/23/2018 08:06 12/23/2018 12:06 12/23/2018 17:10 12/23/2018 17:12 12/23/2018 17:44 12/23/2018 18:13 12/23/2018 21:50 12/24/2018 08:34  Glucose-Capillary Latest Ref Range: 70 - 99 mg/dL 268 (H) 136 (H) 45 (L) 47 (L) 44 (LL) 63 (L) 228 (H) 136 (H)   Review of Glycemic Control  Diabetes history: DM 1 Outpatient Diabetes medications: Lantus 15 units, Novolog 0-12 units SSI and to cover carbohydrate intake Current orders for Inpatient glycemic control: Lantus 15 units, Novolog 0-20 units tid, Novolog 0-5 units qhs  Inpatient Diabetes Program Recommendations:     Hypoglycemia related to insulin stacking and usually patient's with DM type 1 are more sensitive to insulin doses. renal function went up slightly.   Consider decreasing Novolog correction to Novolog 0-9 units tid. Will watch glucose trends.  Thanks,  Tama Headings RN, MSN, BC-ADM Inpatient Diabetes Coordinator Team Pager [phone] (8a-5p)

## 2018-12-25 DIAGNOSIS — G9340 Encephalopathy, unspecified: Secondary | ICD-10-CM

## 2018-12-25 LAB — BLOOD CULTURE ID PANEL (REFLEXED)

## 2018-12-25 LAB — BASIC METABOLIC PANEL
Anion gap: 4 — ABNORMAL LOW (ref 5–15)
BUN: <5 mg/dL — ABNORMAL LOW (ref 6–20)
CO2: 18 mmol/L — ABNORMAL LOW (ref 22–32)
Calcium: 7.5 mg/dL — ABNORMAL LOW (ref 8.9–10.3)
Chloride: 115 mmol/L — ABNORMAL HIGH (ref 98–111)
Creatinine, Ser: 1.14 mg/dL — ABNORMAL HIGH (ref 0.44–1.00)
GFR calc Af Amer: >60 mL/min (ref >60)
GFR calc non Af Amer: 56 mL/min — ABNORMAL LOW (ref >60)
Glucose, Bld: 117 mg/dL — ABNORMAL HIGH (ref 70–99)
Potassium: 4.3 mmol/L (ref 3.5–5.1)
Sodium: 137 mmol/L (ref 135–145)

## 2018-12-25 LAB — GLUCOSE, CAPILLARY
Glucose-Capillary: 47 mg/dL — ABNORMAL LOW (ref 70–99)
Glucose-Capillary: 84 mg/dL (ref 70–99)
Glucose-Capillary: 76 mg/dL (ref 70–99)

## 2018-12-25 LAB — MAGNESIUM: Magnesium: 1.8 mg/dL (ref 1.7–2.4)

## 2018-12-25 MED ORDER — LOPERAMIDE HCL 2 MG PO CAPS: 2.0000 mg | ORAL_CAPSULE | Freq: Three times a day (TID) | ORAL | 0 refills | Status: DC | PRN

## 2018-12-25 MED ORDER — ONDANSETRON HCL 4 MG PO TABS: 4.0000 mg | ORAL_TABLET | Freq: Three times a day (TID) | ORAL | 0 refills | Status: DC | PRN

## 2018-12-25 MED ORDER — INSULIN GLARGINE 100 UNIT/ML ~~LOC~~ SOLN
7.0000 [IU] | Freq: Every day | SUBCUTANEOUS | Status: DC
  Filled 2018-12-25: qty 0.07

## 2018-12-25 NOTE — Care Management Important Message (Signed)
Important Message  Patient Details  Name: [name] MRN: [id number] Date of Birth: 07/08/1965   Medicare Important Message Given:  Yes     Cylinda Santoli 12/25/2018, 1:37 PM

## 2018-12-25 NOTE — Plan of Care (Signed)
Care plan's needs met for pt. She is adequate for discharge

## 2018-12-25 NOTE — Progress Notes (Signed)
Occupational Therapy Treatment Patient Details Name: [name] MRN: [id number] DOB: 01-07-1966 Today's Date: 12/25/2018    History of present illness 53 year old female with history of diabetes mellitus type 1, tardive dyskinesia secondary to Reglan, CAD status post CABG, depression, severe gastroparesis on Mestinon, recent hospitalization from 12/15/2018-12/17/2018 for DKA presented with nausea, vomiting and diarrhea on 12/22/2018.  She was found to be in DKA and started on insulin drip which was subsequently transitioned to long-acting insulin after anion gap closed.   OT comments  Pt progressing towards acute OT goals. Focus of session was grooming tasks standing at sink and education on energy conservation for managing ADLs at home. D/c plan remains appropriate.   Follow Up Recommendations  Home health OT;Other (comment)    Equipment Recommendations  3 in 1 bedside commode    Recommendations for Other Services      Precautions / Restrictions Precautions Precautions: Fall Restrictions Weight Bearing Restrictions: No       Mobility Bed Mobility Overal bed mobility: Needs Assistance Bed Mobility: Supine to Sit     Supine to sit: Min guard;HOB elevated        Transfers Overall transfer level: Needs assistance Equipment used: None Transfers: Sit to/from Stand Sit to Stand: Min guard         General transfer comment: Min guard for safety.     Balance Overall balance assessment: Needs assistance Sitting-balance support: No upper extremity supported;Feet supported Sitting balance-Leahy Scale: Good     Standing balance support: No upper extremity supported;During functional activity;Single extremity supported Standing balance-Leahy Scale: Fair Standing balance comment: Able to maintain static standing without UE support                            ADL either performed or assessed with clinical judgement   ADL Overall ADL's : Needs  assistance/impaired     Grooming: Oral care;Wash/dry hands;Min guard;Standing Grooming Details (indicate cue type and reason): Pt reporting fatigue. Leaning onto sink.                             Functional mobility during ADLs: Min guard General ADL Comments: Pt reporting generalized weakness/fatigue. Educated on energy conservation for managing ADLs.      Vision       Perception     Praxis      Cognition Arousal/Alertness: Awake/alert Behavior During Therapy: WFL for tasks assessed/performed Overall Cognitive Status: Within Functional Limits for tasks assessed                                          Exercises     Shoulder Instructions       General Comments Pt reports baseline R shoulder pain with shoulder flexion ~ >90*. "It hurts when I put a shirt on."     Pertinent Vitals/ Pain       Pain Assessment: Faces Faces Pain Scale: Hurts a little bit Pain Location: back with prolonged standing activity Pain Descriptors / Indicators: Discomfort;Sore Pain Intervention(s): Monitored during session  Home Living                                          Prior Functioning/Environment  Frequency  Min 2X/week        Progress Toward Goals  OT Goals(current goals can now be found in the care plan section)  Progress towards OT goals: Progressing toward goals  Acute Rehab OT Goals Patient Stated Goal: remain safe and independent at home OT Goal Formulation: With patient Time For Goal Achievement: 01/07/19 Potential to Achieve Goals: Good ADL Goals Pt Will Perform Grooming: with modified independence;standing Pt Will Perform Lower Body Bathing: with modified independence;sit to/from stand Pt Will Perform Lower Body Dressing: with modified independence;sit to/from stand Pt Will Transfer to Toilet: with modified independence;ambulating Pt Will Perform Toileting - Clothing Manipulation and hygiene: with  modified independence;sit to/from stand Pt/caregiver will Perform Home Exercise Program: Increased strength;Both right and left upper extremity;Independently;With written HEP provided Additional ADL Goal #1: Pt will independently demonstrate/verbalize at least 3 ways to conserve energy during functional tasks.  Plan Discharge plan remains appropriate    Co-evaluation                 AM-PAC OT "6 Clicks" Daily Activity     Outcome Measure   Help from another person eating meals?: None Help from another person taking care of personal grooming?: None Help from another person toileting, which includes using toliet, bedpan, or urinal?: A Little Help from another person bathing (including washing, rinsing, drying)?: A Little Help from another person to put on and taking off regular upper body clothing?: None Help from another person to put on and taking off regular lower body clothing?: A Little 6 Click Score: 21    End of Session    OT Visit Diagnosis: Unsteadiness on feet (R26.81);Muscle weakness (generalized) (M62.81)   Activity Tolerance Patient tolerated treatment well;Patient limited by fatigue   Patient Left in chair;with call bell/phone within reach   Nurse Communication          Time: 0938-1000 OT Time Calculation (min): 22 min  Charges: OT General Charges $OT Visit: 1 Visit OT Treatments $Self Care/Home Management : 8-22 mins  [name], OT Acute Rehabilitation Services Pager: [phone] Office: [phone]    [name] 12/25/2018, 12:45 PM

## 2018-12-25 NOTE — Progress Notes (Signed)
Patient was stable at discharge. IV team removed pt's IJ. We reviewed the discharge education. Patient verbalized understanding and had no further questions. Patient left with belongings in hand.

## 2018-12-25 NOTE — Discharge Summary (Signed)
Physician Discharge Summary  [name] WRU:[id number] DOB: [birth year] DOA: 12/22/2018  PCP: [name], MD  Admit date: 12/22/2018 Discharge date: 12/25/2018  Admitted From: Home Disposition: Home  Recommendations for Outpatient Follow-up:  1. Follow up with PCP in 1 week with repeat CBC/BMP 2. Follow up in ED if symptoms worsen or new appear   Home Health: Home health PT/RN Equipment/Devices: None  Discharge Condition: Stable CODE STATUS: Full Diet recommendation: Heart healthy/carb modified  Brief/Interim Summary: 53 year old female with history of diabetes mellitus type 1, tardive dyskinesia secondary to Reglan, CAD status post CABG, depression, severe gastroparesis on Mestinon, recent hospitalization from 12/15/2018-12/17/2018 for DKA presented with nausea, vomiting and diarrhea on 12/22/2018.  She was found to be in DKA and started on insulin drip which was subsequently transitioned to long-acting insulin after anion gap closed.  Stool for C. difficile and GI PCR were negative.  Her appetite is slightly improving.  Her diarrhea is improving.  She will be discharged home with outpatient follow-up with PCP.   Discharge Diagnoses:  Principal Problem:   DKA (diabetic ketoacidoses) ([place]) Active Problems:   AKI (acute kidney injury) ([place])   S/P CABG x 3   Acute encephalopathy  DKA in a patient with diabetes mellitus type 1 -Treated with insulin drip and subsequently transitioned to long-acting subcutaneous Lantus -Had episodes of hypoglycemia yesterday and this morning as well.    Will give Lantus 7 units today. -Currently still on IV fluids -Patient feels better and is tolerating diet better.  Her diarrhea is present but improving.  She feels better enough to go home.  Will discharge home and patient will follow up with her PCP as an outpatient.  Continue home regimen of insulin.  Intractable nausea/vomiting/diarrhea: Patient probably has gastroparesis as well -Improved.  Still having some diarrhea but improving.  Stool for C. difficile/GI panel negative.  Use Imodium for diarrhea as noted. -Continue Zofran and Phenergan as needed.  Patient cannot tolerate Reglan and has history of tardive dyskinesia with Reglan -Tolerating diet.  Outpatient follow-up  Acute metabolic encephalopathy -Probably from above.  Improved  SIRS: -From above.  Resolved.  No signs of sepsis.  Cultures negative.  Off antibiotics.  History of CAD status post CABG -Stable.  Outpatient follow-up with cardiology.  Continue metoprolol, aspirin  Hypertension -Blood pressure on the lower side.  Continue metoprolol.  Losartan will remain on hold on discharge.  Acute kidney injury with metabolic acidosis  -Improving.  Treated with IV fluids.  Outpatient follow-up.  Keep losartan and Lasix on hold.  Tardive dyskinesia due to Reglan -Continue home Mestinon.  Avoid Reglan or Compazine.  Outpatient follow-up  Generalized deconditioning -PT eval recommended home health PT.  Discharge Instructions  Discharge Instructions    Diet - low sodium heart healthy   Complete by: As directed    Diet Carb Modified   Complete by: As directed    Increase activity slowly   Complete by: As directed      Allergies as of 12/25/2018      Reactions   Anesthetics, Amide Nausea And Vomiting   Penicillins Diarrhea, Nausea And Vomiting, Other (See Comments)   Has patient had a PCN reaction causing immediate rash, facial/tongue/throat swelling, SOB or lightheadedness with hypotension: Yes Has patient had a PCN reaction causing severe rash involving mucus membranes or skin necrosis: No Has patient had a PCN reaction that required hospitalization No Has patient had a PCN reaction occurring within the last 10 years: Yes  If all  of the above answers are "NO", then may proceed with Cephalosporin use.   Buprenorphine Hcl Rash   Encainide Nausea And Vomiting   Metoclopramide Other (See Comments)    Dystonia, muscle rigidity.  Patient states she is only allergic to the "pill form, not the IV"      Medication List    STOP taking these medications   losartan 25 MG tablet Commonly known as: COZAAR   medroxyPROGESTERone 10 MG tablet Commonly known as: PROVERA   potassium chloride 10 MEQ tablet Commonly known as: K-DUR   Tyler Aas FlexTouch 100 UNIT/ML Sopn FlexTouch Pen Generic drug: insulin degludec     TAKE these medications   Accu-Chek Aviva Plus test strip Generic drug: glucose blood Use as instructed to test blood sugar 4 times daily Dx E10.65   ammonium lactate 12 % cream Commonly known as: AMLACTIN Apply topically as needed for dry skin.   aspirin EC 81 MG tablet Take 1 tablet (81 mg total) by mouth daily.   busPIRone 10 MG tablet Commonly known as: BUSPAR Take 1 tablet (10 mg total) by mouth 3 (three) times daily.   diphenhydrAMINE 25 mg capsule Commonly known as: BENADRYL Take 25 mg by mouth every 6 (six) hours as needed for allergies.   docusate sodium 100 MG capsule Commonly known as: Colace Take 1 capsule (100 mg total) by mouth daily as needed for mild constipation.   furosemide 40 MG tablet Commonly known as: LASIX Take 1 tablet (40 mg total) by mouth daily as needed for fluid or edema.   insulin aspart 100 UNIT/ML injection Commonly known as: novoLOG Inject 0-12 Units into the skin 3 (three) times daily before meals. As per sliding scale   Insulin Glargine 100 UNIT/ML Solostar Pen Commonly known as: Lantus SoloStar Inject 15 units under the skin once daily in the morning.   Insulin Syringe-Needle U-100 31G X 5/16" 0.5 ML Misc Commonly known as: BD Insulin Syringe Ultrafine 1 each by Does not apply route 4 (four) times daily.   loperamide 2 MG capsule Commonly known as: IMODIUM Take 1 capsule (2 mg total) by mouth 3 (three) times daily as needed for diarrhea or loose stools. What changed: when to take this   metoprolol tartrate 25 MG  tablet Commonly known as: LOPRESSOR Take 0.5 tablets (12.5 mg total) by mouth 2 (two) times daily.   ondansetron 4 MG tablet Commonly known as: Zofran Take 1 tablet (4 mg total) by mouth every 8 (eight) hours as needed for nausea or vomiting.   OneTouch Delica Plus [id number] Misc 1 Units by Does not apply route as directed.   OneTouch Verio w/Device Kit Use as directed   pantoprazole 40 MG tablet Commonly known as: PROTONIX Take 1 tablet (40 mg total) by mouth daily.   pregabalin 150 MG capsule Commonly known as: Lyrica Take 1 capsule (150 mg total) by mouth 2 (two) times daily.   promethazine 12.5 MG tablet Commonly known as: PHENERGAN Take 1 tablet (12.5 mg total) by mouth every 8 (eight) hours as needed for up to 30 doses for nausea or vomiting.   pyridostigmine 60 MG tablet Commonly known as: MESTINON Take 60 mg by mouth 3 (three) times daily.      Follow-up Information    [name], MD. Schedule an appointment as soon as possible for a visit in 1 week(s).   Specialty: Family Medicine Why: with repeat cbc/bmp Contact information: [place] [address] [phone]  Nahser, Wonda Cheng, MD .   Specialty: Cardiology Contact information: [place] [address] [place] [address] [phone]          Allergies  Allergen Reactions  . Anesthetics, Amide Nausea And Vomiting  . Penicillins Diarrhea, Nausea And Vomiting and Other (See Comments)    Has patient had a PCN reaction causing immediate rash, facial/tongue/throat swelling, SOB or lightheadedness with hypotension: Yes Has patient had a PCN reaction causing severe rash involving mucus membranes or skin necrosis: No Has patient had a PCN reaction that required hospitalization No Has patient had a PCN reaction occurring within the last 10 years: Yes  If all of the above answers are "NO", then may proceed with Cephalosporin use.   . Buprenorphine Hcl Rash  .  Encainide Nausea And Vomiting  . Metoclopramide Other (See Comments)    Dystonia, muscle rigidity.  Patient states she is only allergic to the "pill form, not the IV"    Consultations:  None   Procedures/Studies: Ct Abdomen Pelvis Wo Contrast  Result Date: 11/26/2018 CLINICAL DATA:  Generalized abdominal pain EXAM: CT ABDOMEN AND PELVIS WITHOUT CONTRAST TECHNIQUE: Multidetector CT imaging of the abdomen and pelvis was performed following the standard protocol without IV contrast. COMPARISON:  CT abdomen pelvis, 05/31/2015, pelvic ultrasound, 11/13/2018 FINDINGS: Lower chest: No acute abnormality.  Fluid in the lower esophagus. Hepatobiliary: No focal liver abnormality is seen. Status post cholecystectomy. No biliary dilatation. Pancreas: Unremarkable. No pancreatic ductal dilatation or surrounding inflammatory changes. Spleen: Normal in size without focal abnormality. Adrenals/Urinary Tract: Adrenal glands are unremarkable. Kidneys are normal, without renal calculi, focal lesion, or hydronephrosis. Bladder is unremarkable. Stomach/Bowel: Stomach is within normal limits. Appendix appears normal. No evidence of bowel wall thickening, distention, or inflammatory changes. Vascular/Lymphatic: Calcific atherosclerosis. No enlarged abdominal or pelvic lymph nodes. Reproductive: Calcified fibroid of the uterine fundus. 3.1 cm fluid attenuation lesion of the left ovary, in keeping with simple follicles seen on recent prior ultrasound. Other: No abdominal wall hernia or abnormality. No abdominopelvic ascites. Musculoskeletal: No acute or significant osseous findings. IMPRESSION: 1. No acute noncontrast CT findings of the abdomen or pelvis to explain pain. 2.  Fluid in the lower esophagus, which may be seen with reflux. 3.  Other chronic and incidental findings as detailed above. Electronically Signed   By: Eddie Candle M.D.   On: 11/26/2018 19:23   Dg Abd 1 View  Result Date: 12/16/2018 CLINICAL DATA:   history of diabetic gastroparesis, diabetes mellitus type 1 insulin-dependent, depression, coronary artery disease, history of tardive dyskinesia on Reglan, hyperlipidemia came to the hospital complains of nausea vomiting and abdominal pain. According to the patient she had not been feeling well over past few days therefore she is only been taking half the dose of her insulin. Upon admission she was found to be in diabetic ketoacidosis. Severe constipation for several days. EXAM: ABDOMEN - 1 VIEW COMPARISON:  CT 11/26/2018 and previous FINDINGS: The bowel gas pattern is normal. Cholecystectomy clips. No radio-opaque calculi or other significant radiographic abnormality are seen. IMPRESSION: Negative. Electronically Signed   By: Lucrezia Europe M.D.   On: 12/16/2018 15:07   Dg Chest Portable 1 View  Result Date: 12/22/2018 CLINICAL DATA:  Central line placement EXAM: PORTABLE CHEST 1 VIEW COMPARISON:  December 15, 2018 FINDINGS: The left-sided central venous catheter tip terminates over the SVC. Again noted are postsurgical changes related to prior median sternotomy. There is no evidence of a left-sided pneumothorax. There is mild volume  overload. There may be a trace right-sided pleural effusion. There is no acute osseous abnormality. IMPRESSION: 1. Left-sided central venous catheter tip terminates over the proximal SVC. There is no evidence for left-sided pneumothorax. 2. Mild volume overload. 3. Status post median sternotomy. Electronically Signed   By: Constance Holster M.D.   On: 12/22/2018 02:36   Dg Chest Port 1 View  Result Date: 12/15/2018 CLINICAL DATA:  53 year old female status post central line placement. EXAM: PORTABLE CHEST 1 VIEW COMPARISON:  Chest radiograph dated 02/18/2018 FINDINGS: Left IJ central venous line with tip at the cavoatrial junction. No pneumothorax. The lungs are clear. There is no pleural effusion. The cardiac silhouette is within normal limits. Median sternotomy wires and CABG  vascular clips. No acute osseous pathology. IMPRESSION: Left IJ central venous line with tip at the cavoatrial junction. No pneumothorax. Electronically Signed   By: Anner Crete M.D.   On: 12/15/2018 23:34   [place] Ekg Site Rite  Result Date: 12/15/2018 If Site Rite image not attached, placement could not be confirmed due to current cardiac rhythm.      Subjective: Patient seen and examined at bedside.  She feels that her diarrhea is improving.  She is tolerating diet better.  She feels that she can go home today.  No overnight fever or vomiting.  Discharge Exam: Vitals:   12/24/18 2216 12/25/18 0552  BP: 136/66 (!) 124/58  Pulse: 69 70  Resp:    Temp: 98.2 F (36.8 C) 98.6 F (37 C)  SpO2: 98% 93%    General: Pt is alert, awake, not in acute distress Cardiovascular: rate controlled, S1/S2 + Respiratory: bilateral decreased breath sounds at bases Abdominal: Soft, NT, ND, bowel sounds + Extremities: no edema, no cyanosis    The results of significant diagnostics from this hospitalization (including imaging, microbiology, ancillary and laboratory) are listed below for reference.     Microbiology: Recent Results (from the past 240 hour(s))  MRSA PCR Screening     Status: None   Collection Time: 12/15/18  4:40 PM   Specimen: Nasopharyngeal  Result Value Ref Range Status   MRSA by PCR NEGATIVE NEGATIVE Final    Comment:        The GeneXpert MRSA Assay (FDA approved for NASAL specimens only), is one component of a comprehensive MRSA colonization surveillance program. It is not intended to diagnose MRSA infection nor to guide or monitor treatment for MRSA infections. Performed at [facility], [place] [address] [place], [place] [address]   Novel Coronavirus,NAA,(SEND-OUT TO REF LAB - TAT 24-48 hrs); Hosp Order     Status: None   Collection Time: 12/22/18  3:09 AM   Specimen: Nasopharyngeal Swab; Respiratory  Result Value Ref Range Status    SARS-CoV-2, NAA NOT DETECTED NOT DETECTED Final    Comment: (NOTE) This test was developed and its performance characteristics determined by [employer]. This test has not been FDA cleared or approved. This test has been authorized by FDA under an Emergency Use Authorization (EUA). This test is only authorized for the duration of time the declaration that circumstances exist justifying the authorization of the emergency use of in vitro diagnostic tests for detection of SARS-CoV-2 virus and/or diagnosis of COVID-19 infection under section 564(b)(1) of the Act, 21 U.S.C. 540JWJ-1(B)(1), unless the authorization is terminated or revoked sooner. When diagnostic testing is negative, the possibility of a false negative result should be considered in the context of a patient's recent exposures and the presence of clinical  signs and symptoms consistent with COVID-19. An individual without symptoms of COVID-19 and who is not shedding SARS-CoV-2 virus would expect to have a negative (not detected) result in this assay. Performed  At: [facility] [address] [place], [place] [id number] Rush Farmer MD DJ:[id number]    [place]  Final    Comment: Performed at [facility], [place] [address], [place], [place] [address]  Culture, Urine     Status: None   Collection Time: 12/22/18  6:10 PM   Specimen: Urine, Random  Result Value Ref Range Status   Specimen Description URINE, RANDOM  Final   Special Requests NONE  Final   Culture   Final    NO GROWTH Performed at [facility], [place] [address], [place], [place] [address]    Report Status 12/23/2018 FINAL  Final  Culture, blood (routine x 2)     Status: None (Preliminary result)   Collection Time: 12/22/18  8:11 PM   Specimen: BLOOD  Result Value Ref Range Status   Specimen Description BLOOD SITE NOT SPECIFIED  Final   Special Requests   Final    BOTTLES DRAWN AEROBIC ONLY Blood Culture  results may not be optimal due to an inadequate volume of blood received in culture bottles   Culture  Setup Time   Final    GRAM POSITIVE COCCI AEROBIC BOTTLE ONLY CRITICAL RESULT CALLED TO, READ BACK BY AND VERIFIED WITHEzekiel Slocumb PHARMD, AT [place] 12/25/18 BY Rush Landmark Performed at [facility], [place] [address], [place], [place] [address]    Culture GRAM POSITIVE COCCI  Final   Report Status PENDING  Incomplete  Blood Culture ID Panel (Reflexed)     Status: Abnormal   Collection Time: 12/22/18  8:11 PM  Result Value Ref Range Status   Enterococcus species NOT DETECTED NOT DETECTED Final   Listeria monocytogenes NOT DETECTED NOT DETECTED Final   Staphylococcus species DETECTED (A) NOT DETECTED Final    Comment: Methicillin (oxacillin) resistant coagulase negative staphylococcus. Possible blood culture contaminant (unless isolated from more than one blood culture draw or clinical case suggests pathogenicity). No antibiotic treatment is indicated for blood  culture contaminants. CRITICAL RESULT CALLED TO, READ BACK BY AND VERIFIED WITH: E. SINCLAIR PHARMD, AT 0844 12/25/18 BY D. VANHOOK    Staphylococcus aureus (BCID) NOT DETECTED NOT DETECTED Final   Methicillin resistance DETECTED (A) NOT DETECTED Final    Comment: CRITICAL RESULT CALLED TO, READ BACK BY AND VERIFIED WITH: E. SINCLAIR PHARMD, AT 4081 12/25/18 BY D. VANHOOK    Streptococcus species NOT DETECTED NOT DETECTED Final   Streptococcus agalactiae NOT DETECTED NOT DETECTED Final   Streptococcus pneumoniae NOT DETECTED NOT DETECTED Final   Streptococcus pyogenes NOT DETECTED NOT DETECTED Final   Acinetobacter baumannii NOT DETECTED NOT DETECTED Final   Enterobacteriaceae species NOT DETECTED NOT DETECTED Final   Enterobacter cloacae complex NOT DETECTED NOT DETECTED Final   Escherichia coli NOT DETECTED NOT DETECTED Final   Klebsiella oxytoca NOT DETECTED NOT DETECTED Final   Klebsiella pneumoniae NOT DETECTED NOT DETECTED  Final   Proteus species NOT DETECTED NOT DETECTED Final   Serratia marcescens NOT DETECTED NOT DETECTED Final   Haemophilus influenzae NOT DETECTED NOT DETECTED Final   Neisseria meningitidis NOT DETECTED NOT DETECTED Final   Pseudomonas aeruginosa NOT DETECTED NOT DETECTED Final   Candida albicans NOT DETECTED NOT DETECTED Final   Candida glabrata NOT DETECTED NOT DETECTED Final  Candida krusei NOT DETECTED NOT DETECTED Final   Candida parapsilosis NOT DETECTED NOT DETECTED Final   Candida tropicalis NOT DETECTED NOT DETECTED Final    Comment: Performed at [facility], [place] [address], [place], [place] [address]  Culture, blood (routine x 2)     Status: None (Preliminary result)   Collection Time: 12/22/18  9:43 PM   Specimen: BLOOD LEFT HAND  Result Value Ref Range Status   Specimen Description BLOOD LEFT HAND  Final   Special Requests   Final    BOTTLES DRAWN AEROBIC ONLY Blood Culture results may not be optimal due to an inadequate volume of blood received in culture bottles   Culture   Final    NO GROWTH 3 DAYS Performed at [facility], [place] [address], [place], [place] [address]    Report Status PENDING  Incomplete  C difficile quick scan w PCR reflex     Status: None   Collection Time: 12/23/18 12:14 AM   Specimen: STOOL  Result Value Ref Range Status   C Diff antigen NEGATIVE NEGATIVE Final   C Diff toxin NEGATIVE NEGATIVE Final   C Diff interpretation No C. difficile detected.  Final    Comment: Performed at [facility], [place] [address], [place], [place] [address]  Gastrointestinal Panel by PCR , Stool     Status: None   Collection Time: 12/23/18 12:14 AM   Specimen: Stool  Result Value Ref Range Status   Campylobacter species NOT DETECTED NOT DETECTED Final   Plesimonas shigelloides NOT DETECTED NOT DETECTED Final   Salmonella species NOT DETECTED NOT DETECTED Final   Yersinia enterocolitica NOT DETECTED NOT DETECTED Final   Vibrio species NOT  DETECTED NOT DETECTED Final   Vibrio cholerae NOT DETECTED NOT DETECTED Final   Enteroaggregative E coli (EAEC) NOT DETECTED NOT DETECTED Final   Enteropathogenic E coli (EPEC) NOT DETECTED NOT DETECTED Final   Enterotoxigenic E coli (ETEC) NOT DETECTED NOT DETECTED Final   Shiga like toxin producing E coli (STEC) NOT DETECTED NOT DETECTED Final   Shigella/Enteroinvasive E coli (EIEC) NOT DETECTED NOT DETECTED Final   Cryptosporidium NOT DETECTED NOT DETECTED Final   Cyclospora cayetanensis NOT DETECTED NOT DETECTED Final   Entamoeba histolytica NOT DETECTED NOT DETECTED Final   Giardia lamblia NOT DETECTED NOT DETECTED Final   Adenovirus F40/41 NOT DETECTED NOT DETECTED Final   Astrovirus NOT DETECTED NOT DETECTED Final   Norovirus GI/GII NOT DETECTED NOT DETECTED Final   Rotavirus A NOT DETECTED NOT DETECTED Final   Sapovirus (I, II, IV, and V) NOT DETECTED NOT DETECTED Final    Comment: Performed at [facility], [place] [place], [place] [address]     Labs: BNP (last 3 results) No results for input(s): BNP in the last 8760 hours. Basic Metabolic Panel: Recent Labs  Lab 12/22/18 1406 12/22/18 1845 12/23/18 0427 12/24/18 0431 12/25/18 0317  NA 141 137 136 137 137  K 3.8 3.2* 4.1 4.1 4.3  CL 111 107 110 113* 115*  CO2 21* 21* 19* 17* 18*  GLUCOSE 171* 146* 294* 127* 117*  BUN 11 9 7 7  <5*  CREATININE 1.19* 1.13* 1.12* 1.27* 1.14*  CALCIUM 9.0 8.6* 8.1* 7.7* 7.5*  MG 2.0 1.8 1.7 1.9 1.8   Liver Function Tests: Recent Labs  Lab 12/22/18 0157  AST 17  ALT 18  ALKPHOS 81  BILITOT 1.7*  PROT 6.9  ALBUMIN 3.6   Recent Labs  Lab 12/22/18 0157  LIPASE 21   No results for input(s): AMMONIA in the last 168 hours. CBC: Recent Labs  Lab 12/22/18 0137 12/22/18 0157 12/22/18 0202 12/23/18 0427  WBC  --  10.6*  --  7.9  NEUTROABS  --  9.4*  --   --   HGB 10.9* 10.5* 10.5* 9.4*  HCT 32.0* 34.4* 31.0* 29.8*  MCV  --  91.5  --  89.2  PLT  --  321   --  336   Cardiac Enzymes: No results for input(s): CKTOTAL, CKMB, CKMBINDEX, TROPONINI in the last 168 hours. BNP: Invalid input(s): POCBNP CBG: Recent Labs  Lab 12/24/18 1214 12/24/18 1606 12/24/18 2218 12/25/18 0824 12/25/18 0941  GLUCAP 212* 82 193* 47* 84   D-Dimer No results for input(s): DDIMER in the last 72 hours. Hgb A1c No results for input(s): HGBA1C in the last 72 hours. Lipid Profile No results for input(s): CHOL, HDL, LDLCALC, TRIG, CHOLHDL, LDLDIRECT in the last 72 hours. Thyroid function studies No results for input(s): TSH, T4TOTAL, T3FREE, THYROIDAB in the last 72 hours.  Invalid input(s): FREET3 Anemia work up No results for input(s): VITAMINB12, FOLATE, FERRITIN, TIBC, IRON, RETICCTPCT in the last 72 hours. Urinalysis    Component Value Date/Time   COLORURINE STRAW (A) 12/22/2018 1820   APPEARANCEUR CLEAR 12/22/2018 1820   LABSPEC 1.012 12/22/2018 1820   PHURINE 5.0 12/22/2018 1820   GLUCOSEU >=500 (A) 12/22/2018 1820   HGBUR SMALL (A) 12/22/2018 1820   BILIRUBINUR NEGATIVE 12/22/2018 1820   BILIRUBINUR negative 11/13/2018 1102   KETONESUR 20 (A) 12/22/2018 1820   PROTEINUR 30 (A) 12/22/2018 1820   UROBILINOGEN 0.2 11/13/2018 1102   UROBILINOGEN 0.2 04/27/2015 1608   NITRITE NEGATIVE 12/22/2018 1820   LEUKOCYTESUR NEGATIVE 12/22/2018 1820   Sepsis Labs Invalid input(s): PROCALCITONIN,  WBC,  LACTICIDVEN Microbiology Recent Results (from the past 240 hour(s))  MRSA PCR Screening     Status: None   Collection Time: 12/15/18  4:40 PM   Specimen: Nasopharyngeal  Result Value Ref Range Status   MRSA by PCR NEGATIVE NEGATIVE Final    Comment:        The GeneXpert MRSA Assay (FDA approved for NASAL specimens only), is one component of a comprehensive MRSA colonization surveillance program. It is not intended to diagnose MRSA infection nor to guide or monitor treatment for MRSA infections. Performed at [facility], [place] [address] [place], [place] [address]   Novel Coronavirus,NAA,(SEND-OUT TO REF LAB - TAT 24-48 hrs); Hosp Order     Status: None   Collection Time: 12/22/18  3:09 AM   Specimen: Nasopharyngeal Swab; Respiratory  Result Value Ref Range Status   SARS-CoV-2, NAA NOT DETECTED NOT DETECTED Final    Comment: (NOTE) This test was developed and its performance characteristics determined by [employer]. This test has not been FDA cleared or approved. This test has been authorized by FDA under an Emergency Use Authorization (EUA). This test is only authorized for the duration of time the declaration that circumstances exist justifying the authorization of the emergency use of in vitro diagnostic tests for detection of SARS-CoV-2 virus and/or diagnosis of COVID-19 infection under section 564(b)(1) of the Act, 21 U.S.C. 370WUG-8(B)(1), unless the authorization is terminated or revoked sooner. When diagnostic testing is negative, the possibility of a false negative result should be considered in the context of a patient's recent exposures and the presence of clinical signs and symptoms consistent with COVID-19. An individual without  symptoms of COVID-19 and who is not shedding SARS-CoV-2 virus would expect to have a negative (not detected) result in this assay. Performed  At: [facility] [address] [place], [place] [id number] Rush Farmer MD XN:[id number]    [place]  Final    Comment: Performed at [facility], [place] [address] [place], [place] [address]  Culture, Urine     Status: None   Collection Time: 12/22/18  6:10 PM   Specimen: Urine, Random  Result Value Ref Range Status   Specimen Description URINE, RANDOM  Final   Special Requests NONE  Final   Culture   Final    NO GROWTH Performed at [facility], [place] [address] [place] [address], [place], [place] [address]    Report Status 12/23/2018 FINAL  Final  Culture, blood (routine x 2)      Status: None (Preliminary result)   Collection Time: 12/22/18  8:11 PM   Specimen: BLOOD  Result Value Ref Range Status   Specimen Description BLOOD SITE NOT SPECIFIED  Final   Special Requests   Final    BOTTLES DRAWN AEROBIC ONLY Blood Culture results may not be optimal due to an inadequate volume of blood received in culture bottles   Culture  Setup Time   Final    GRAM POSITIVE COCCI AEROBIC BOTTLE ONLY CRITICAL RESULT CALLED TO, READ BACK BY AND VERIFIED WITHEzekiel Slocumb PHARMD, AT [place] 12/25/18 BY Rush Landmark Performed at [facility], [place] [address], [place], [place] [address]    Culture GRAM POSITIVE COCCI  Final   Report Status PENDING  Incomplete  Blood Culture ID Panel (Reflexed)     Status: Abnormal   Collection Time: 12/22/18  8:11 PM  Result Value Ref Range Status   Enterococcus species NOT DETECTED NOT DETECTED Final   Listeria monocytogenes NOT DETECTED NOT DETECTED Final   Staphylococcus species DETECTED (A) NOT DETECTED Final    Comment: Methicillin (oxacillin) resistant coagulase negative staphylococcus. Possible blood culture contaminant (unless isolated from more than one blood culture draw or clinical case suggests pathogenicity). No antibiotic treatment is indicated for blood  culture contaminants. CRITICAL RESULT CALLED TO, READ BACK BY AND VERIFIED WITH: E. SINCLAIR PHARMD, AT 0844 12/25/18 BY D. VANHOOK    Staphylococcus aureus (BCID) NOT DETECTED NOT DETECTED Final   Methicillin resistance DETECTED (A) NOT DETECTED Final    Comment: CRITICAL RESULT CALLED TO, READ BACK BY AND VERIFIED WITH: E. SINCLAIR PHARMD, AT 1761 12/25/18 BY D. VANHOOK    Streptococcus species NOT DETECTED NOT DETECTED Final   Streptococcus agalactiae NOT DETECTED NOT DETECTED Final   Streptococcus pneumoniae NOT DETECTED NOT DETECTED Final   Streptococcus pyogenes NOT DETECTED NOT DETECTED Final   Acinetobacter baumannii NOT DETECTED NOT DETECTED Final   Enterobacteriaceae species  NOT DETECTED NOT DETECTED Final   Enterobacter cloacae complex NOT DETECTED NOT DETECTED Final   Escherichia coli NOT DETECTED NOT DETECTED Final   Klebsiella oxytoca NOT DETECTED NOT DETECTED Final   Klebsiella pneumoniae NOT DETECTED NOT DETECTED Final   Proteus species NOT DETECTED NOT DETECTED Final   Serratia marcescens NOT DETECTED NOT DETECTED Final   Haemophilus influenzae NOT DETECTED NOT DETECTED Final   Neisseria meningitidis NOT DETECTED NOT DETECTED Final   Pseudomonas aeruginosa NOT DETECTED NOT DETECTED Final   Candida albicans NOT DETECTED NOT DETECTED Final   Candida glabrata NOT DETECTED NOT DETECTED Final   Candida krusei NOT DETECTED NOT DETECTED Final  Candida parapsilosis NOT DETECTED NOT DETECTED Final   Candida tropicalis NOT DETECTED NOT DETECTED Final    Comment: Performed at [facility], [place] [address], [place], [place] [address]  Culture, blood (routine x 2)     Status: None (Preliminary result)   Collection Time: 12/22/18  9:43 PM   Specimen: BLOOD LEFT HAND  Result Value Ref Range Status   Specimen Description BLOOD LEFT HAND  Final   Special Requests   Final    BOTTLES DRAWN AEROBIC ONLY Blood Culture results may not be optimal due to an inadequate volume of blood received in culture bottles   Culture   Final    NO GROWTH 3 DAYS Performed at [facility], [place] [address], [place], [place] [address]    Report Status PENDING  Incomplete  C difficile quick scan w PCR reflex     Status: None   Collection Time: 12/23/18 12:14 AM   Specimen: STOOL  Result Value Ref Range Status   C Diff antigen NEGATIVE NEGATIVE Final   C Diff toxin NEGATIVE NEGATIVE Final   C Diff interpretation No C. difficile detected.  Final    Comment: Performed at [facility], [place] [address], [place], [place] [address]  Gastrointestinal Panel by PCR , Stool     Status: None   Collection Time: 12/23/18 12:14 AM   Specimen: Stool  Result Value Ref Range Status    Campylobacter species NOT DETECTED NOT DETECTED Final   Plesimonas shigelloides NOT DETECTED NOT DETECTED Final   Salmonella species NOT DETECTED NOT DETECTED Final   Yersinia enterocolitica NOT DETECTED NOT DETECTED Final   Vibrio species NOT DETECTED NOT DETECTED Final   Vibrio cholerae NOT DETECTED NOT DETECTED Final   Enteroaggregative E coli (EAEC) NOT DETECTED NOT DETECTED Final   Enteropathogenic E coli (EPEC) NOT DETECTED NOT DETECTED Final   Enterotoxigenic E coli (ETEC) NOT DETECTED NOT DETECTED Final   Shiga like toxin producing E coli (STEC) NOT DETECTED NOT DETECTED Final   Shigella/Enteroinvasive E coli (EIEC) NOT DETECTED NOT DETECTED Final   Cryptosporidium NOT DETECTED NOT DETECTED Final   Cyclospora cayetanensis NOT DETECTED NOT DETECTED Final   Entamoeba histolytica NOT DETECTED NOT DETECTED Final   Giardia lamblia NOT DETECTED NOT DETECTED Final   Adenovirus F40/41 NOT DETECTED NOT DETECTED Final   Astrovirus NOT DETECTED NOT DETECTED Final   Norovirus GI/GII NOT DETECTED NOT DETECTED Final   Rotavirus A NOT DETECTED NOT DETECTED Final   Sapovirus (I, II, IV, and V) NOT DETECTED NOT DETECTED Final    Comment: Performed at [facility], [address], [place], [place] [address]     Time coordinating discharge: 35 minutes  SIGNED:   Aline August, MD  [facility] 12/25/2018, 10:50 AM

## 2018-12-25 NOTE — Progress Notes (Signed)
PHARMACY - PHYSICIAN COMMUNICATION CRITICAL VALUE ALERT - BLOOD CULTURE IDENTIFICATION (BCID)  [name] is an 53 y.o. female who presented to [facility] on 12/22/2018 with a chief complaint of nausea vomiting and diarrhea.   Assessment:  53 year old female admitted with DKA. Now with coag negative staph in 1/4 blood cultures. Likely a contaminant. Patient is afebrile with WBC WNL off of antibiotics.   Name of physician (or Provider) ContactedStarla Link  Current antibiotics: none  Changes to prescribed antibiotics recommended:  None   Results for orders placed or performed during the hospital encounter of 12/22/18  Blood Culture ID Panel (Reflexed) (Collected: 12/22/2018  8:11 PM)  Result Value Ref Range   Enterococcus species NOT DETECTED NOT DETECTED   Listeria monocytogenes NOT DETECTED NOT DETECTED   Staphylococcus species DETECTED (A) NOT DETECTED   Staphylococcus aureus (BCID) NOT DETECTED NOT DETECTED   Methicillin resistance DETECTED (A) NOT DETECTED   Streptococcus species NOT DETECTED NOT DETECTED   Streptococcus agalactiae NOT DETECTED NOT DETECTED   Streptococcus pneumoniae NOT DETECTED NOT DETECTED   Streptococcus pyogenes NOT DETECTED NOT DETECTED   Acinetobacter baumannii NOT DETECTED NOT DETECTED   Enterobacteriaceae species NOT DETECTED NOT DETECTED   Enterobacter cloacae complex NOT DETECTED NOT DETECTED   Escherichia coli NOT DETECTED NOT DETECTED   Klebsiella oxytoca NOT DETECTED NOT DETECTED   Klebsiella pneumoniae NOT DETECTED NOT DETECTED   Proteus species NOT DETECTED NOT DETECTED   Serratia marcescens NOT DETECTED NOT DETECTED   Haemophilus influenzae NOT DETECTED NOT DETECTED   Neisseria meningitidis NOT DETECTED NOT DETECTED   Pseudomonas aeruginosa NOT DETECTED NOT DETECTED   Candida albicans NOT DETECTED NOT DETECTED   Candida glabrata NOT DETECTED NOT DETECTED   Candida krusei NOT DETECTED NOT DETECTED   Candida parapsilosis NOT DETECTED NOT  DETECTED   Candida tropicalis NOT DETECTED NOT DETECTED    Jimmy Footman, PharmD, BCPS, BCIDP Infectious Diseases Clinical Pharmacist Phone: [phone] 12/25/2018  9:20 AM

## 2018-12-25 NOTE — TOC Transition Note (Signed)
Transition of Care [facility]) - CM/SW Discharge Note   Patient Details  Name: [name] MRN: [id number] Date of Birth: 1965/09/01  [facility] CM/SW Contact:  [name], RN Phone Number: 12/25/2018, 2:15 PM   Clinical Narrative:    Readmitted with DKA.  Previous hospitalization, DKA  12/15/2018-12/17/2018  Transition to home today with sister. States doesn't need BSC. [facility] [place] to provide home health services ( RN,PT).  Son to provide transportation to home.   Final next level of care: [place] Barriers to Discharge: No Barriers Identified   Patient Goals and CMS Choice Patient states their goals for this hospitalization and ongoing recovery are:: ton go home CMS Medicare.gov Compare Post Acute Care list provided to:: Patient Choice offered to / list presented to : Patient  Discharge Placement                       Discharge Plan and Services In-house Referral: NA Discharge Planning Services: CM Consult            DME Arranged: N/A DME Agency: NA       HH Arranged: RN, PT HH Agency: [facility] Date [place] Agency Contacted: 12/25/18 Time HH Agency Contacted: 1120 Representative spoke with at [place]: [place] ([place]) Interventions     Readmission Risk Interventions Readmission Risk Prevention Plan 12/16/2018  Transportation Screening Complete  Medication Review Press photographer) Complete  PCP or Specialist appointment within 3-5 days of discharge Not Complete  PCP/Specialist Appt Not Complete comments Not ready for DC  [place] or [place] Not Complete  HRI or Home Care Consult Pt Refusal Comments NA  SW Recovery Care/Counseling Consult Not Complete  SW Consult Not Complete Comments NA  Palliative Care Screening Not Complete  Comments NA  [place] Not Complete  SNF Comments NA  Some recent data might be hidden

## 2018-12-26 LAB — CULTURE, BLOOD (ROUTINE X 2)

## 2018-12-27 LAB — CULTURE, BLOOD (ROUTINE X 2): Culture: NO GROWTH

## 2018-12-29 ENCOUNTER — Telehealth: Payer: Self-pay

## 2018-12-29 NOTE — Telephone Encounter (Signed)
Transition Care Management Follow-up Telephone Call  Attempt # 1 Date of discharge and from where: 12/25/2018.[facility]  Call placed to # [phone], voicemail full, unable to leave a message.

## 2018-12-30 ENCOUNTER — Telehealth: Payer: Self-pay

## 2018-12-30 NOTE — Telephone Encounter (Signed)
Transition Care Management Follow-up Telephone Call Date of discharge and from where: 12/25/2018, [facility].  Call placed to # [phone], voicemail full, unable to leave a message.  Call placed to [facility] # [phone]  in attempt to determine if they have been able to reach the patient.  Voicemail message left on home health voicemail requesting a call back to this CM # [phone]

## 2018-12-31 ENCOUNTER — Encounter

## 2018-12-31 ENCOUNTER — Ambulatory Visit: Payer: Medicare HMO | Admitting: Podiatry

## 2019-01-01 ENCOUNTER — Telehealth: Payer: Self-pay | Admitting: Endocrinology

## 2019-01-01 NOTE — Telephone Encounter (Signed)
Please refill if appropriate

## 2019-01-01 NOTE — Telephone Encounter (Signed)
MEDICATION: pregabalin (LYRICA) 150 MG capsule  PHARMACY:   [id number] [place] (Phone) [phone] (Fax)    IS THIS A 90 DAY SUPPLY :  If possible  IS PATIENT OUT OF MEDICATION:  No  IF NOT; HOW MUCH IS LEFT: 3 days  LAST APPOINTMENT DATE: @6 /25/2020  NEXT APPOINTMENT DATE:@7 /23/2020  DO WE HAVE YOUR PERMISSION TO LEAVE A DETAILED MESSAGE: yes  OTHER COMMENTS:    **Let patient know to contact pharmacy at the end of the day to make sure medication is ready. **  ** Please notify patient to allow 48-72 hours to process**  **Encourage patient to contact the pharmacy for refills or they can request refills through [facility]**

## 2019-01-02 ENCOUNTER — Telehealth: Payer: Self-pay | Admitting: Dietician

## 2019-01-02 ENCOUNTER — Other Ambulatory Visit: Payer: Self-pay | Admitting: Endocrinology

## 2019-01-02 MED ORDER — PREGABALIN 150 MG PO CAPS: 150.0000 mg | ORAL_CAPSULE | Freq: Two times a day (BID) | ORAL | 1 refills | Status: DC

## 2019-01-02 NOTE — Telephone Encounter (Signed)
Patient called for an appointment as she was told by Dr. Dwyane Dee to see the dietitian prior to his visit 7/23. I was able to fit her in 7/21.  Will review carbohydrate counting. She has had 2 instances of DKA recently. Discussed that she should bring her meter so this can be downloaded at Dr. Ronnie Derby office.  Antonieta [place], RD, LDN, CDE

## 2019-01-05 ENCOUNTER — Telehealth: Payer: Self-pay

## 2019-01-05 NOTE — Telephone Encounter (Signed)
Call received from patient stating that she needs re-certification for [facility]. Informed her that this CM can complete part B, if she can drop off part A this CM will fax to [employer].  Explained that she does not have to come into the clinic, the documents can be dropped off at the front door.   She also inquired about scheduling an appointment with PCP. She would prefer webex as she does not want to risk coming into the clinic. Informed her that Dr Margarita Rana would be notified of her request

## 2019-01-06 NOTE — Telephone Encounter (Signed)
Call placed to patient and informed her that Dr Margarita Rana stated that a virtual visit can be arranged.  The patient said that she would like to check her schedule and call back to make an appointment.    She also said that she may need to wait until next week to drop off the paperwork for [facility]

## 2019-01-06 NOTE — Telephone Encounter (Signed)
Okay to schedule a virtual visit for her.  Thank you

## 2019-01-13 ENCOUNTER — Ambulatory Visit: Payer: Medicare HMO | Admitting: Dietician

## 2019-01-13 NOTE — Telephone Encounter (Signed)
Left message to call Kaitlyn at [phone]. 

## 2019-01-14 NOTE — Telephone Encounter (Signed)
Spoke with patient. Patient has not had a cycle since May. Has not been sexually active in over 2 weeks. Will return call to schedule once she has days for transportation. Aware not to be sexually active until after procedure. Will need UPT in office. This will need to be completed after colposcopy.

## 2019-01-15 ENCOUNTER — Encounter: Payer: Medicare HMO | Admitting: Endocrinology

## 2019-01-15 ENCOUNTER — Other Ambulatory Visit: Payer: Self-pay

## 2019-01-15 ENCOUNTER — Encounter: Payer: Self-pay | Admitting: Endocrinology

## 2019-01-15 NOTE — Progress Notes (Signed)
This encounter was created in error - please disregard.

## 2019-01-20 ENCOUNTER — Inpatient Hospital Stay (HOSPITAL_COMMUNITY)
Admission: EM | Admit: 2019-01-20 | Discharge: 2019-01-23 | DRG: 638 | Disposition: A | Discharge status: Home / Self Care | Payer: Medicare HMO | Attending: Internal Medicine | Admitting: Internal Medicine

## 2019-01-20 ENCOUNTER — Inpatient Hospital Stay: Payer: Self-pay

## 2019-01-20 ENCOUNTER — Inpatient Hospital Stay (HOSPITAL_COMMUNITY): Payer: Medicare HMO

## 2019-01-20 ENCOUNTER — Other Ambulatory Visit: Payer: Self-pay

## 2019-01-20 ENCOUNTER — Encounter (HOSPITAL_COMMUNITY): Payer: Self-pay | Admitting: Emergency Medicine

## 2019-01-20 ENCOUNTER — Ambulatory Visit: Payer: Medicare HMO | Admitting: Endocrinology

## 2019-01-20 DIAGNOSIS — Z888 Allergy status to other drugs, medicaments and biological substances status: Secondary | ICD-10-CM

## 2019-01-20 DIAGNOSIS — F329 Major depressive disorder, single episode, unspecified: Secondary | ICD-10-CM | POA: Diagnosis present

## 2019-01-20 DIAGNOSIS — Z951 Presence of aortocoronary bypass graft: Secondary | ICD-10-CM

## 2019-01-20 DIAGNOSIS — N390 Urinary tract infection, site not specified: Secondary | ICD-10-CM | POA: Diagnosis present

## 2019-01-20 DIAGNOSIS — E1110 Type 2 diabetes mellitus with ketoacidosis without coma: Secondary | ICD-10-CM | POA: Diagnosis not present

## 2019-01-20 DIAGNOSIS — E1010 Type 1 diabetes mellitus with ketoacidosis without coma: Principal | ICD-10-CM | POA: Diagnosis present

## 2019-01-20 DIAGNOSIS — B9689 Other specified bacterial agents as the cause of diseases classified elsewhere: Secondary | ICD-10-CM | POA: Diagnosis present

## 2019-01-20 DIAGNOSIS — I2510 Atherosclerotic heart disease of native coronary artery without angina pectoris: Secondary | ICD-10-CM | POA: Diagnosis present

## 2019-01-20 DIAGNOSIS — Z884 Allergy status to anesthetic agent status: Secondary | ICD-10-CM

## 2019-01-20 DIAGNOSIS — K219 Gastro-esophageal reflux disease without esophagitis: Secondary | ICD-10-CM | POA: Diagnosis present

## 2019-01-20 DIAGNOSIS — I10 Essential (primary) hypertension: Secondary | ICD-10-CM | POA: Diagnosis present

## 2019-01-20 DIAGNOSIS — I878 Other specified disorders of veins: Secondary | ICD-10-CM

## 2019-01-20 DIAGNOSIS — Z794 Long term (current) use of insulin: Secondary | ICD-10-CM

## 2019-01-20 DIAGNOSIS — F419 Anxiety disorder, unspecified: Secondary | ICD-10-CM | POA: Diagnosis present

## 2019-01-20 DIAGNOSIS — E785 Hyperlipidemia, unspecified: Secondary | ICD-10-CM | POA: Diagnosis present

## 2019-01-20 DIAGNOSIS — Z7982 Long term (current) use of aspirin: Secondary | ICD-10-CM | POA: Diagnosis not present

## 2019-01-20 DIAGNOSIS — Z20828 Contact with and (suspected) exposure to other viral communicable diseases: Secondary | ICD-10-CM | POA: Diagnosis present

## 2019-01-20 DIAGNOSIS — Z79899 Other long term (current) drug therapy: Secondary | ICD-10-CM | POA: Diagnosis not present

## 2019-01-20 DIAGNOSIS — Z880 Allergy status to penicillin: Secondary | ICD-10-CM | POA: Diagnosis not present

## 2019-01-20 DIAGNOSIS — E1040 Type 1 diabetes mellitus with diabetic neuropathy, unspecified: Secondary | ICD-10-CM | POA: Diagnosis present

## 2019-01-20 DIAGNOSIS — Z0289 Encounter for other administrative examinations: Secondary | ICD-10-CM

## 2019-01-20 DIAGNOSIS — E1043 Type 1 diabetes mellitus with diabetic autonomic (poly)neuropathy: Secondary | ICD-10-CM | POA: Diagnosis present

## 2019-01-20 DIAGNOSIS — K3184 Gastroparesis: Secondary | ICD-10-CM | POA: Diagnosis present

## 2019-01-20 DIAGNOSIS — Z833 Family history of diabetes mellitus: Secondary | ICD-10-CM

## 2019-01-20 DIAGNOSIS — E1143 Type 2 diabetes mellitus with diabetic autonomic (poly)neuropathy: Secondary | ICD-10-CM | POA: Diagnosis not present

## 2019-01-20 DIAGNOSIS — E876 Hypokalemia: Secondary | ICD-10-CM | POA: Diagnosis not present

## 2019-01-20 DIAGNOSIS — I252 Old myocardial infarction: Secondary | ICD-10-CM | POA: Diagnosis not present

## 2019-01-20 DIAGNOSIS — N179 Acute kidney failure, unspecified: Secondary | ICD-10-CM | POA: Diagnosis present

## 2019-01-20 DIAGNOSIS — G2401 Drug induced subacute dyskinesia: Secondary | ICD-10-CM | POA: Diagnosis present

## 2019-01-20 DIAGNOSIS — F32A Depression, unspecified: Secondary | ICD-10-CM | POA: Diagnosis present

## 2019-01-20 DIAGNOSIS — Z8249 Family history of ischemic heart disease and other diseases of the circulatory system: Secondary | ICD-10-CM

## 2019-01-20 HISTORY — PX: IR US GUIDE VASC ACCESS RIGHT: IMG2390

## 2019-01-20 HISTORY — PX: IR VENIPUNCTURE 3YRS/OLDER BY MD: IMG2287

## 2019-01-20 LAB — BASIC METABOLIC PANEL
Anion gap: 16 — ABNORMAL HIGH (ref 5–15)
Anion gap: 12 (ref 5–15)
Anion gap: 8 (ref 5–15)
BUN: 15 mg/dL (ref 6–20)
BUN: 14 mg/dL (ref 6–20)
BUN: 14 mg/dL (ref 6–20)
CO2: 11 mmol/L — ABNORMAL LOW (ref 22–32)
CO2: 16 mmol/L — ABNORMAL LOW (ref 22–32)
CO2: 18 mmol/L — ABNORMAL LOW (ref 22–32)
Calcium: 8.5 mg/dL — ABNORMAL LOW (ref 8.9–10.3)
Calcium: 8.5 mg/dL — ABNORMAL LOW (ref 8.9–10.3)
Calcium: 8.1 mg/dL — ABNORMAL LOW (ref 8.9–10.3)
Chloride: 119 mmol/L — ABNORMAL HIGH (ref 98–111)
Chloride: 116 mmol/L — ABNORMAL HIGH (ref 98–111)
Chloride: 119 mmol/L — ABNORMAL HIGH (ref 98–111)
Creatinine, Ser: 1.03 mg/dL — ABNORMAL HIGH (ref 0.44–1.00)
Creatinine, Ser: 1.04 mg/dL — ABNORMAL HIGH (ref 0.44–1.00)
Creatinine, Ser: 1.09 mg/dL — ABNORMAL HIGH (ref 0.44–1.00)
GFR calc Af Amer: >60 mL/min (ref >60)
GFR calc Af Amer: >60 mL/min (ref >60)
GFR calc Af Amer: >60 mL/min (ref >60)
GFR calc non Af Amer: >60 mL/min (ref >60)
GFR calc non Af Amer: >60 mL/min (ref >60)
GFR calc non Af Amer: 59 mL/min — ABNORMAL LOW (ref >60)
Glucose, Bld: 228 mg/dL — ABNORMAL HIGH (ref 70–99)
Glucose, Bld: 147 mg/dL — ABNORMAL HIGH (ref 70–99)
Glucose, Bld: 199 mg/dL — ABNORMAL HIGH (ref 70–99)
Potassium: 4.6 mmol/L (ref 3.5–5.1)
Potassium: 3.3 mmol/L — ABNORMAL LOW (ref 3.5–5.1)
Potassium: 3.6 mmol/L (ref 3.5–5.1)
Sodium: 146 mmol/L — ABNORMAL HIGH (ref 135–145)
Sodium: 144 mmol/L (ref 135–145)
Sodium: 145 mmol/L (ref 135–145)

## 2019-01-20 LAB — I-STAT CHEM 8, ED
BUN: 18 mg/dL (ref 6–20)
Calcium, Ion: 1.24 mmol/L (ref 1.15–1.40)
Chloride: 116 mmol/L — ABNORMAL HIGH (ref 98–111)
Creatinine, Ser: 0.9 mg/dL (ref 0.44–1.00)
Glucose, Bld: 366 mg/dL — ABNORMAL HIGH (ref 70–99)
HCT: 33 % — ABNORMAL LOW (ref 36.0–46.0)
Hemoglobin: 11.2 g/dL — ABNORMAL LOW (ref 12.0–15.0)
Potassium: 3.8 mmol/L (ref 3.5–5.1)
Sodium: 142 mmol/L (ref 135–145)
TCO2: 16 mmol/L — ABNORMAL LOW (ref 22–32)

## 2019-01-20 LAB — MRSA PCR SCREENING: MRSA by PCR: NEGATIVE

## 2019-01-20 LAB — GLUCOSE, CAPILLARY
Glucose-Capillary: 115 mg/dL — ABNORMAL HIGH (ref 70–99)
Glucose-Capillary: 118 mg/dL — ABNORMAL HIGH (ref 70–99)
Glucose-Capillary: 132 mg/dL — ABNORMAL HIGH (ref 70–99)
Glucose-Capillary: 135 mg/dL — ABNORMAL HIGH (ref 70–99)
Glucose-Capillary: 136 mg/dL — ABNORMAL HIGH (ref 70–99)
Glucose-Capillary: 154 mg/dL — ABNORMAL HIGH (ref 70–99)
Glucose-Capillary: 162 mg/dL — ABNORMAL HIGH (ref 70–99)
Glucose-Capillary: 167 mg/dL — ABNORMAL HIGH (ref 70–99)
Glucose-Capillary: 173 mg/dL — ABNORMAL HIGH (ref 70–99)
Glucose-Capillary: 181 mg/dL — ABNORMAL HIGH (ref 70–99)
Glucose-Capillary: 183 mg/dL — ABNORMAL HIGH (ref 70–99)
Glucose-Capillary: 191 mg/dL — ABNORMAL HIGH (ref 70–99)
Glucose-Capillary: 219 mg/dL — ABNORMAL HIGH (ref 70–99)

## 2019-01-20 LAB — COMPREHENSIVE METABOLIC PANEL
ALT: 13 U/L (ref 0–44)
AST: 18 U/L (ref 15–41)
Albumin: 4 g/dL (ref 3.5–5.0)
Alkaline Phosphatase: 87 U/L (ref 38–126)
Anion gap: 17 — ABNORMAL HIGH (ref 5–15)
BUN: 20 mg/dL (ref 6–20)
CO2: 14 mmol/L — ABNORMAL LOW (ref 22–32)
Calcium: 8.9 mg/dL (ref 8.9–10.3)
Chloride: 111 mmol/L (ref 98–111)
Creatinine, Ser: 1.24 mg/dL — ABNORMAL HIGH (ref 0.44–1.00)
GFR calc Af Amer: 58 mL/min — ABNORMAL LOW (ref >60)
GFR calc non Af Amer: 50 mL/min — ABNORMAL LOW (ref >60)
Glucose, Bld: 366 mg/dL — ABNORMAL HIGH (ref 70–99)
Potassium: 3.8 mmol/L (ref 3.5–5.1)
Sodium: 142 mmol/L (ref 135–145)
Total Bilirubin: 1.1 mg/dL (ref 0.3–1.2)
Total Protein: 8.1 g/dL (ref 6.5–8.1)

## 2019-01-20 LAB — CBC WITH DIFFERENTIAL/PLATELET
Abs Immature Granulocytes: 0.03 10*3/uL (ref 0.00–0.07)
Basophils Absolute: 0 10*3/uL (ref 0.0–0.1)
Basophils Relative: 0 %
Eosinophils Absolute: 0 10*3/uL (ref 0.0–0.5)
Eosinophils Relative: 0 %
HCT: 33.5 % — ABNORMAL LOW (ref 36.0–46.0)
Hemoglobin: 10.1 g/dL — ABNORMAL LOW (ref 12.0–15.0)
Immature Granulocytes: 0 %
Lymphocytes Relative: 6 %
Lymphs Abs: 0.6 10*3/uL — ABNORMAL LOW (ref 0.7–4.0)
MCH: 26.4 pg (ref 26.0–34.0)
MCHC: 30.1 g/dL (ref 30.0–36.0)
MCV: 87.5 fL (ref 80.0–100.0)
Monocytes Absolute: 0.2 10*3/uL (ref 0.1–1.0)
Monocytes Relative: 2 %
Neutro Abs: 9.1 10*3/uL — ABNORMAL HIGH (ref 1.7–7.7)
Neutrophils Relative %: 92 %
Platelets: 399 10*3/uL (ref 150–400)
RBC: 3.83 MIL/uL — ABNORMAL LOW (ref 3.87–5.11)
RDW: 14.6 % (ref 11.5–15.5)
WBC: 9.9 10*3/uL (ref 4.0–10.5)
nRBC: 0 % (ref 0.0–0.2)

## 2019-01-20 LAB — URINALYSIS, ROUTINE W REFLEX MICROSCOPIC
Bilirubin Urine: NEGATIVE
Glucose, UA: >=500 mg/dL — AB
Ketones, ur: 80 mg/dL — AB
Nitrite: POSITIVE — AB
Protein, ur: 100 mg/dL — AB
Specific Gravity, Urine: 1.021 (ref 1.005–1.030)
pH: 6 (ref 5.0–8.0)

## 2019-01-20 LAB — BLOOD GAS, ARTERIAL
Acid-base deficit: 7.5 mmol/L — ABNORMAL HIGH (ref 0.0–2.0)
Bicarbonate: 15.6 mmol/L — ABNORMAL LOW (ref 20.0–28.0)
Drawn by: 257701
FIO2: 21
O2 Saturation: 97.8 %
Patient temperature: 98.6
pCO2 arterial: 25.2 mmHg — ABNORMAL LOW (ref 32.0–48.0)
pH, Arterial: 7.407 (ref 7.350–7.450)
pO2, Arterial: 103 mmHg (ref 83.0–108.0)

## 2019-01-20 LAB — CBG MONITORING, ED: Glucose-Capillary: 317 mg/dL — ABNORMAL HIGH (ref 70–99)

## 2019-01-20 LAB — SARS CORONAVIRUS 2 BY RT PCR (HOSPITAL ORDER, PERFORMED IN ~~LOC~~ HOSPITAL LAB): SARS Coronavirus 2: NEGATIVE

## 2019-01-20 LAB — LACTIC ACID, PLASMA: Lactic Acid, Venous: 3 mmol/L (ref 0.5–1.9)

## 2019-01-20 LAB — LIPASE, BLOOD: Lipase: 24 U/L (ref 11–51)

## 2019-01-20 LAB — PHOSPHORUS
Phosphorus: 2 mg/dL — ABNORMAL LOW (ref 2.5–4.6)
Phosphorus: 1.9 mg/dL — ABNORMAL LOW (ref 2.5–4.6)

## 2019-01-20 LAB — MAGNESIUM
Magnesium: 1.9 mg/dL (ref 1.7–2.4)
Magnesium: 2 mg/dL (ref 1.7–2.4)

## 2019-01-20 MED ORDER — CHLORHEXIDINE GLUCONATE CLOTH 2 % EX PADS
6.0000 | MEDICATED_PAD | Freq: Every day | CUTANEOUS | Status: DC
  Administered 2019-01-20 – 2019-01-21 (×2): 6 via TOPICAL

## 2019-01-20 MED ORDER — SODIUM CHLORIDE 0.9 % IV SOLN: INTRAVENOUS | Status: DC

## 2019-01-20 MED ORDER — INSULIN REGULAR(HUMAN) IN NACL 100-0.9 UT/100ML-% IV SOLN
INTRAVENOUS | Status: DC
  Administered 2019-01-20: 1.6 [IU]/h via INTRAVENOUS
  Filled 2019-01-20 (×2): qty 100

## 2019-01-20 MED ORDER — PREGABALIN 75 MG PO CAPS
150.0000 mg | ORAL_CAPSULE | Freq: Two times a day (BID) | ORAL | Status: DC
  Administered 2019-01-20 – 2019-01-23 (×7): 150 mg via ORAL
  Filled 2019-01-20 (×7): qty 2

## 2019-01-20 MED ORDER — ONDANSETRON HCL 4 MG/2ML IJ SOLN
4.0000 mg | Freq: Four times a day (QID) | INTRAMUSCULAR | Status: DC | PRN
  Administered 2019-01-20 (×2): 4 mg via INTRAVENOUS
  Filled 2019-01-20 (×2): qty 2

## 2019-01-20 MED ORDER — METOPROLOL TARTRATE 12.5 MG HALF TABLET
12.5000 mg | ORAL_TABLET | Freq: Two times a day (BID) | ORAL | Status: DC
  Administered 2019-01-20 – 2019-01-23 (×6): 12.5 mg via ORAL
  Filled 2019-01-20 (×7): qty 1

## 2019-01-20 MED ORDER — SODIUM CHLORIDE 0.9 % IV SOLN
1.0000 g | INTRAVENOUS | Status: DC
  Administered 2019-01-21 – 2019-01-22 (×2): 1 g via INTRAVENOUS
  Filled 2019-01-20: qty 1
  Filled 2019-01-20: qty 10
  Filled 2019-01-20: qty 1
  Filled 2019-01-20: qty 10

## 2019-01-20 MED ORDER — SODIUM CHLORIDE 0.9 % IV BOLUS
1000.0000 mL | Freq: Once | INTRAVENOUS | Status: AC
  Administered 2019-01-20: 1000 mL via INTRAVENOUS

## 2019-01-20 MED ORDER — FENTANYL CITRATE (PF) 100 MCG/2ML IJ SOLN
75.0000 ug | Freq: Once | INTRAMUSCULAR | Status: AC
  Administered 2019-01-20: 75 ug via INTRAVENOUS
  Filled 2019-01-20: qty 2

## 2019-01-20 MED ORDER — PROMETHAZINE HCL 25 MG/ML IJ SOLN
25.0000 mg | Freq: Once | INTRAMUSCULAR | Status: AC
  Administered 2019-01-20: 25 mg via INTRAMUSCULAR

## 2019-01-20 MED ORDER — ONDANSETRON HCL 4 MG/2ML IJ SOLN
4.0000 mg | Freq: Once | INTRAMUSCULAR | Status: AC
  Administered 2019-01-20: 4 mg via INTRAVENOUS
  Filled 2019-01-20: qty 2

## 2019-01-20 MED ORDER — SODIUM CHLORIDE 0.9 % IV SOLN
1.0000 g | Freq: Once | INTRAVENOUS | Status: AC
  Administered 2019-01-20: 1 g via INTRAVENOUS
  Filled 2019-01-20: qty 10

## 2019-01-20 MED ORDER — PANTOPRAZOLE SODIUM 40 MG PO TBEC
40.0000 mg | DELAYED_RELEASE_TABLET | Freq: Every day | ORAL | Status: DC
  Administered 2019-01-20 – 2019-01-23 (×4): 40 mg via ORAL
  Filled 2019-01-20 (×4): qty 1

## 2019-01-20 MED ORDER — ENOXAPARIN SODIUM 40 MG/0.4ML ~~LOC~~ SOLN
40.0000 mg | SUBCUTANEOUS | Status: DC
  Administered 2019-01-20 – 2019-01-23 (×4): 40 mg via SUBCUTANEOUS
  Filled 2019-01-20 (×4): qty 0.4

## 2019-01-20 MED ORDER — POTASSIUM CHLORIDE 10 MEQ/100ML IV SOLN
10.0000 meq | INTRAVENOUS | Status: AC
  Filled 2019-01-20 (×2): qty 100

## 2019-01-20 MED ORDER — LIDOCAINE HCL 1 % IJ SOLN
INTRAMUSCULAR | Status: DC | PRN
  Administered 2019-01-20: 5 mL

## 2019-01-20 MED ORDER — ONDANSETRON HCL 4 MG/2ML IJ SOLN: 4.0000 mg | Freq: Once | INTRAMUSCULAR | Status: DC

## 2019-01-20 MED ORDER — PROMETHAZINE HCL 25 MG/ML IJ SOLN
12.5000 mg | Freq: Four times a day (QID) | INTRAMUSCULAR | Status: DC | PRN
  Administered 2019-01-20 – 2019-01-21 (×4): 12.5 mg via INTRAVENOUS
  Filled 2019-01-20 (×4): qty 1

## 2019-01-20 MED ORDER — PROMETHAZINE HCL 25 MG/ML IJ SOLN
25.0000 mg | Freq: Once | INTRAMUSCULAR | Status: DC
  Filled 2019-01-20: qty 1

## 2019-01-20 MED ORDER — LIDOCAINE HCL 1 % IJ SOLN
INTRAMUSCULAR | Status: AC
  Filled 2019-01-20: qty 20

## 2019-01-20 MED ORDER — BUSPIRONE HCL 5 MG PO TABS
10.0000 mg | ORAL_TABLET | Freq: Three times a day (TID) | ORAL | Status: DC
  Administered 2019-01-20 – 2019-01-23 (×10): 10 mg via ORAL
  Filled 2019-01-20 (×4): qty 2
  Filled 2019-01-20: qty 1
  Filled 2019-01-20: qty 2
  Filled 2019-01-20 (×4): qty 1

## 2019-01-20 MED ORDER — POTASSIUM CHLORIDE 10 MEQ/50ML IV SOLN
10.0000 meq | INTRAVENOUS | Status: AC
  Administered 2019-01-20 (×2): 10 meq via INTRAVENOUS
  Filled 2019-01-20 (×2): qty 50

## 2019-01-20 MED ORDER — ACETAMINOPHEN 325 MG PO TABS
650.0000 mg | ORAL_TABLET | Freq: Four times a day (QID) | ORAL | Status: DC | PRN
  Administered 2019-01-21 (×2): 650 mg via ORAL
  Filled 2019-01-20 (×2): qty 2

## 2019-01-20 MED ORDER — DEXTROSE-NACL 5-0.45 % IV SOLN
INTRAVENOUS | Status: DC
  Administered 2019-01-20: 11:00:00 via INTRAVENOUS

## 2019-01-20 MED ORDER — INSULIN ASPART 100 UNIT/ML ~~LOC~~ SOLN
10.0000 [IU] | Freq: Once | SUBCUTANEOUS | Status: AC
  Administered 2019-01-20: 10 [IU] via SUBCUTANEOUS
  Filled 2019-01-20: qty 0.1

## 2019-01-20 MED ORDER — PYRIDOSTIGMINE BROMIDE 60 MG PO TABS
60.0000 mg | ORAL_TABLET | Freq: Three times a day (TID) | ORAL | Status: DC
  Administered 2019-01-20 – 2019-01-23 (×5): 60 mg via ORAL
  Filled 2019-01-20 (×12): qty 1

## 2019-01-20 NOTE — Procedures (Signed)
DKA  S/p RUE TRIPLE POWER PICC  Tip svcra No comp Stable ebl min Ready for use

## 2019-01-20 NOTE — Procedures (Signed)
Interventional Radiology Procedure Note  Procedure: Bedside US guided IV start.   Vascular Access: 48F microsheath into RUE brachial vein.   Complications: None  Estimated Blood Loss: Neglibible  Recommendations: - Secure IV well   Signed,  Criselda Peaches, MD

## 2019-01-20 NOTE — H&P (Signed)
History and Physical    [name] JME:[id number] DOB: 07/23/1965 DOA: 01/20/2019  PCP: Yvette Rakes, MD  Patient coming from: home   I have personally briefly reviewed patient's old medical records available.   Chief Complaint: nausea, vomiting, belly  Pain   HPI: [name] is a 53 y.o. female with medical history significant of type 1 diabetes mellitus on insulin, tardive dyskinesia secondary to Reglan, coronary artery disease status post CABG, depression and severe gastroparesis on Mestinon was brought to the emergency room with 1 day of severe persistent abdominal pain and nausea.  Patient is poor historian.  She says that she was doing okay since last discharge from the hospital about a month ago, she started having nausea since yesterday morning that would continue to get worse and despite her taking home medications, she continued to take her insulin at home yesterday, by evening, the symptoms were severe enough so she came to the ER. Patient is not very forthcoming with her history.  She states that she has been compliant with medications but her gastroparesis acts up.  Her abdominal pain was severe in intensity last night, she stated was 10 out of 10, and got some better with Phenergan. Patient denies any fever or chills.  Denies any cough cold symptoms.  She thinks her urine may be dark but denies any dysuria or frequency. ED Course: In the emergency room patient was persistently nauseous vomiting.  She was lethargic.  Bicarbonate was 14, creatinine was 1.24, blood sugars were 366, anion gap was 17 and lactic acid was 3.  She was aggressively hydrated after multiple attempts to get IV lines, 10 units of insulin bolus was given, multiple nausea medications were given and started on insulin drip because of persistent symptoms and unable to take p.o. Urinalysis shows grossly abnormal urine and was given a dose of Rocephin with cultures.  Review of Systems: all systems are reviewed  and pertinent positive as per HPI otherwise rest are negative.    Past Medical History:  Diagnosis Date  . AKI (acute kidney injury) ([place])   . Anemia, iron deficiency   . Anxiety and depression 05/18/2015  . CAD (coronary artery disease), native coronary artery 01/11/2018   Non-STEMI with severe three-vessel disease noted July 2019  . Depression   . Diabetes mellitus without complication ([place])   . Diabetic gastroparesis ([place])   . Diabetic neuropathy, type I diabetes mellitus ([place]) 05/18/2015  . DKA (diabetic ketoacidoses) ([place]) 06/16/2017  . DKA, type 1 ([place]) 03/18/2016  . Essential hypertension   . Gastroparesis   . GERD (gastroesophageal reflux disease)   . HLD (hyperlipidemia)   . MDD (major depressive disorder), single episode, severe , no psychosis ([place])   . Non-ST elevation (NSTEMI) myocardial infarction ([place])   . S/P CABG x 3 01/14/2018  . Tardive dyskinesia   . Vitamin B12 deficiency 08/16/2015    Past Surgical History:  Procedure Laterality Date  . CARDIAC SURGERY    . COLONOSCOPY  09/27/2014   at [facility]  . CORONARY ARTERY BYPASS GRAFT N/A 01/14/2018   Procedure: CORONARY ARTERY BYPASS GRAFTING (CABG)X3, RIGHT AND LEFT SAPHENOUS VEIN HARVEST, MAMMARY TAKE DOWN. MAMMARY TO LAD, SVG TO PD, SVG TO DISTAL CIRC.;  Surgeon: Yvette Isaac, MD;  Location: [place];  Service: Open Heart Surgery;  Laterality: N/A;  . ESOPHAGOGASTRODUODENOSCOPY  09/27/2014   at [facility], Dr [name]. biospy neg for celiac, neg for H pylori.   . EYE SURGERY    .  gailstones    . IR FLUORO GUIDE CV LINE RIGHT  02/01/2017  . IR FLUORO GUIDE CV LINE RIGHT  03/06/2017  . IR FLUORO GUIDE CV LINE RIGHT  03/25/2017  . IR GENERIC HISTORICAL  01/24/2016   IR FLUORO GUIDE CV LINE RIGHT 01/24/2016 Yvette K Allred, PA-C [facility]  . IR GENERIC HISTORICAL  01/24/2016   IR US GUIDE VASC ACCESS RIGHT 01/24/2016 Yvette K Allred, PA-C [facility]  . IR US GUIDE VASC ACCESS RIGHT  02/01/2017  . IR US GUIDE VASC  ACCESS RIGHT  03/06/2017  . IR US GUIDE VASC ACCESS RIGHT  03/25/2017  . LEFT HEART CATH AND CORONARY ANGIOGRAPHY N/A 01/07/2018   Procedure: LEFT HEART CATH AND CORONARY ANGIOGRAPHY;  Surgeon: Yvette Sine, MD;  Location: [place];  Service: Cardiovascular;  Laterality: N/A;  . POSTERIOR VITRECTOMY AND MEMBRANE PEEL-LEFT EYE  09/28/2002  . POSTERIOR VITRECTOMY AND MEMBRANE PEEL-RIGHT EYE  03/16/2002  . RETINAL DETACHMENT SURGERY    . TEE WITHOUT CARDIOVERSION N/A 01/14/2018   Procedure: TRANSESOPHAGEAL ECHOCARDIOGRAM (TEE);  Surgeon: Yvette Isaac, MD;  Location: [facility];  Service: Open Heart Surgery;  Laterality: N/A;     reports that she has never smoked. She has never used smokeless tobacco. She reports current drug use. Drug: Marijuana. She reports that she does not drink alcohol.  Allergies  Allergen Reactions  . Anesthetics, Amide Nausea And Vomiting  . Penicillins Diarrhea, Nausea And Vomiting and Other (See Comments)    Has patient had a PCN reaction causing immediate rash, facial/tongue/throat swelling, SOB or lightheadedness with hypotension: Yes Has patient had a PCN reaction causing severe rash involving mucus membranes or skin necrosis: No Has patient had a PCN reaction that required hospitalization No Has patient had a PCN reaction occurring within the last 10 years: Yes  If all of the above answers are "NO", then may proceed with Cephalosporin use.   . Buprenorphine Hcl Rash  . Encainide Nausea And Vomiting  . Metoclopramide Other (See Comments)    Dystonia, muscle rigidity.  Patient states she is only allergic to the "pill form, not the IV"    Family History  Problem Relation Age of Onset  . Cystic fibrosis Mother   . Hypertension Father   . Diabetes Brother   . Hypertension Maternal Grandmother      Prior to Admission medications   Medication Sig Start Date End Date Taking? Authorizing Provider  ammonium lactate (AMLACTIN) 12 % cream Apply topically  as needed for dry skin. 07/08/18  Yes [name], DPM  aspirin EC 81 MG tablet Take 1 tablet (81 mg total) by mouth daily. 06/03/18  Yes [name], Yvette Cheng, MD  Blood Glucose Monitoring Suppl (ONETOUCH VERIO) w/Device KIT Use as directed 03/21/18  Yes Newlin, [name], MD  busPIRone (BUSPAR) 10 MG tablet Take 1 tablet (10 mg total) by mouth 3 (three) times daily. 06/12/18  Yes Yvette Rakes, MD  diphenhydrAMINE (BENADRYL) 25 mg capsule Take 25 mg by mouth every 6 (six) hours as needed for allergies.   Yes [provider]  docusate sodium (COLACE) 100 MG capsule Take 1 capsule (100 mg total) by mouth daily as needed for mild constipation. 02/03/18  Yes Burtis Junes, NP  glucose blood (ACCU-CHEK AVIVA PLUS) test strip Use as instructed to test blood sugar 4 times daily Dx E10.65 12/18/18  Yes Elayne Snare, MD  insulin aspart (NOVOLOG) 100 UNIT/ML injection Inject 0-12 Units into the skin 3 (  three) times daily before meals. As per sliding scale 10/14/18  Yes Elayne Snare, MD  Insulin Glargine (LANTUS SOLOSTAR) 100 UNIT/ML Solostar Pen Inject 15 units under the skin once daily in the morning. 10/14/18  Yes Elayne Snare, MD  Insulin Syringe-Needle U-100 (BD INSULIN SYRINGE ULTRAFINE) 31G X 5/16" 0.5 ML MISC 1 each by Does not apply route 4 (four) times daily. 04/28/18  Yes Elayne Snare, MD  Lancets [facility] DELICA PLUS [id number]) [place] 1 Units by Does not apply route as directed. 03/21/18  Yes Yvette Rakes, MD  loperamide (IMODIUM) 2 MG capsule Take 1 capsule (2 mg total) by mouth 3 (three) times daily as needed for diarrhea or loose stools. 12/25/18  Yes Aline August, MD  metoprolol tartrate (LOPRESSOR) 25 MG tablet Take 0.5 tablets (12.5 mg total) by mouth 2 (two) times daily. 10/08/18  Yes [name], Yvette Cheng, MD  ondansetron (ZOFRAN) 4 MG tablet Take 1 tablet (4 mg total) by mouth every 8 (eight) hours as needed for nausea or vomiting. 12/25/18 12/25/19 Yes Aline August, MD  pantoprazole (PROTONIX)  40 MG tablet Take 1 tablet (40 mg total) by mouth daily. 06/12/18  Yes Yvette Rakes, MD  pregabalin (LYRICA) 150 MG capsule Take 1 capsule (150 mg total) by mouth 2 (two) times daily. 01/02/19  Yes Elayne Snare, MD  promethazine (PHENERGAN) 12.5 MG tablet Take 1 tablet (12.5 mg total) by mouth every 8 (eight) hours as needed for up to 30 doses for nausea or vomiting. 12/17/18  Yes Amin, Ankit Chirag, MD  pyridostigmine (MESTINON) 60 MG tablet Take 60 mg by mouth 3 (three) times daily.   Yes [provider]    Physical Exam: Vitals:   01/20/19 0612 01/20/19 0630 01/20/19 0700 01/20/19 0830  BP: (!) 174/80 106/86 140/74 (!) 149/68  Pulse: 94 (!) 106 98 (!) 102  Resp: (!) 29  17 (!) 21  Temp:      TempSrc:      SpO2: 99% 100% 99% 98%    Constitutional: NAD, calm, comfortable Vitals:   01/20/19 0612 01/20/19 0630 01/20/19 0700 01/20/19 0830  BP: (!) 174/80 106/86 140/74 (!) 149/68  Pulse: 94 (!) 106 98 (!) 102  Resp: (!) 29  17 (!) 21  Temp:      TempSrc:      SpO2: 99% 100% 99% 98%   Eyes: PERRL, lids and conjunctivae normal.  Chronically sick looking.  Patient has some dyskinetic movements.  She is not in any distress. ENMT: Mucous membranes are dry. Posterior pharynx clear of any exudate or lesions.Normal dentition.  Neck: normal, supple, no masses, no thyromegaly Respiratory: clear to auscultation bilaterally, no wheezing, no crackles. Normal respiratory effort. No accessory muscle use.  Cardiovascular: Regular rate and rhythm, tachycardic.  No murmurs / rubs / gallops. No extremity edema. 2+ pedal pulses. No carotid bruits.  Abdomen: no tenderness, no masses palpated. No hepatosplenomegaly. Bowel sounds positive.  Musculoskeletal: no clubbing / cyanosis. No joint deformity upper and lower extremities. Good ROM, no contractures. Normal muscle tone.  Skin: no rashes, lesions, ulcers. No induration Neurologic: CN 2-12 grossly intact. Sensation intact, DTR normal. Strength  5/5 in all 4.  Psychiatric: Normal judgment and insight. Alert and oriented x 3.  Anxious.    Labs on Admission: I have personally reviewed following labs and imaging studies  CBC: Recent Labs  Lab 01/20/19 0731 01/20/19 0738  WBC 9.9  --   NEUTROABS 9.1*  --   HGB 10.1* 11.2*  HCT 33.5*  33.0*  MCV 87.5  --   PLT 399  --    Basic Metabolic Panel: Recent Labs  Lab 01/20/19 0731 01/20/19 0738  NA 142 142  K 3.8 3.8  CL 111 116*  CO2 14*  --   GLUCOSE 366* 366*  BUN 20 18  CREATININE 1.24* 0.90  CALCIUM 8.9  --    GFR: CrCl cannot be calculated (Unknown ideal weight.). Liver Function Tests: Recent Labs  Lab 01/20/19 0731  AST 18  ALT 13  ALKPHOS 87  BILITOT 1.1  PROT 8.1  ALBUMIN 4.0   Recent Labs  Lab 01/20/19 0731  LIPASE 24   No results for input(s): AMMONIA in the last 168 hours. Coagulation Profile: No results for input(s): INR, PROTIME in the last 168 hours. Cardiac Enzymes: No results for input(s): CKTOTAL, CKMB, CKMBINDEX, TROPONINI in the last 168 hours. BNP (last 3 results) No results for input(s): PROBNP in the last 8760 hours. HbA1C: No results for input(s): HGBA1C in the last 72 hours. CBG: Recent Labs  Lab 01/20/19 0533  GLUCAP 317*   Lipid Profile: No results for input(s): CHOL, HDL, LDLCALC, TRIG, CHOLHDL, LDLDIRECT in the last 72 hours. Thyroid Function Tests: No results for input(s): TSH, T4TOTAL, FREET4, T3FREE, THYROIDAB in the last 72 hours. Anemia Panel: No results for input(s): VITAMINB12, FOLATE, FERRITIN, TIBC, IRON, RETICCTPCT in the last 72 hours. Urine analysis:    Component Value Date/Time   COLORURINE YELLOW 01/20/2019 0607   APPEARANCEUR HAZY (A) 01/20/2019 0607   LABSPEC 1.021 01/20/2019 0607   PHURINE 6.0 01/20/2019 0607   GLUCOSEU >=500 (A) 01/20/2019 0607   HGBUR SMALL (A) 01/20/2019 0607   BILIRUBINUR NEGATIVE 01/20/2019 0607   BILIRUBINUR negative 11/13/2018 1102   KETONESUR 80 (A) 01/20/2019 0607    PROTEINUR 100 (A) 01/20/2019 0607   UROBILINOGEN 0.2 11/13/2018 1102   UROBILINOGEN 0.2 04/27/2015 1608   NITRITE POSITIVE (A) 01/20/2019 0607   LEUKOCYTESUR TRACE (A) 01/20/2019 0607    Radiological Exams on Admission: No results found.  EKG: Independently reviewed.  Sinus tachycardia.  QTC is 511.  Assessment/Plan Principal Problem:   DKA (diabetic ketoacidoses) (H[facility]) Active Problems:   Diabetic neuropathy, type I diabetes mellitus ([place])   Anxiety and depression   Diabetic gastroparesis ([facility])   AKI (acute kidney injury) ([place])   HLD (hyperlipidemia)   GERD (gastroesophageal reflux disease)   Essential hypertension   Tardive dyskinesia     1.  Diabetic ketoacidosis: Admit to stepdown monitored unit given severity of symptoms. Vital signs every 4 hours. N.p.o. until anion gap is closed.  Will allow some non-carb fluid and ice chips. Blood sugars every hour until on IV insulin. BMP every 4 hours until on IV insulin.  Keep potassium 3.5-5, supplement as per protocol. Check magnesium and phosphorus every 8 hours. Bolus IV fluids given in the ER. Keep on isotonic saline until blood sugars more than 250. When blood sugars less than 250, changed to 5% dextrose and continue insulin regimen. Will transition to subcu insulin once patient is able to take by mouth as well anion gap is closed. Hemoglobin A1c, known 9.8,40-monthago.  2.  Suspected UTI: We will continue Rocephin until final cultures.  3.  Tardive dyskinesia with prolonged QTC: QTC is 511.  She will use Phenergan but not Zofran.  4.  Diabetic gastroparesis: Persistent symptoms.  On IV fluids.  On Phenergan.  She will continue pyridostigmine from home.  5.  History of coronary artery disease: No evidence  of acute coronary syndrome.  No chest pain.  EKG nonischemic.  She will take aspirin once he starts taking p.o.  We will continue beta-blockers.  Patient has severe significant disease with the risk of decompensation  and need for IV insulin, every 1 hour blood check and adjustment of medications.  She will need inpatient hospitalization and treatment to avoid further decompensation and deterioration of the disease.  DVT prophylaxis: Lovenox subcu Code Status: Full code Family Communication: None Disposition Plan: Home after hospitalization Consults called: None Admission status: Inpatient to stepdown   Barb Merino MD [facility] Pager [phone]  If 7PM-7AM, please contact night-coverage [email or web address] Password [email or web address]  01/20/2019, 9:01 AM

## 2019-01-20 NOTE — ED Triage Notes (Signed)
Arrives via EMS from home. Endorses N/V/D all day, hx of gastroparesis and DKA, CBG 337. Patient teary and sticking fingers down throat in hallway.

## 2019-01-20 NOTE — ED Provider Notes (Signed)
[facility] DEPT Provider Note   CSN: [id number] Arrival date & time: 01/20/19  0221     History   Chief Complaint Chief Complaint  Patient presents with  . Hyperglycemia  . Emesis    HPI [name] is a 53 y.o. female with history of tardive dyskinesia, DM Type 1, previous episodes of DKA, AKI, CAD s/p CABG, HTN, and gastroparesis who presents to the emergency department by EMS with a chief complaint of nausea, vomiting, and diarrhea that began today.  CBG was 337 with EMS.  Patient was tearful on arrival and intermittently sticking her fingers down her throat attempting to make her self vomit.  She reports associated abdominal and chest pain.  She has some pain with movement of her right shoulder and states that she has a "bad right shoulder." The patient is unable to provide much history.  She denies fever or chills.  Level 5 caveat secondary to altered mental status.     The history is provided by the patient. No language interpreter was used.    Past Medical History:  Diagnosis Date  . AKI (acute kidney injury) ([place])   . Anemia, iron deficiency   . Anxiety and depression 05/18/2015  . CAD (coronary artery disease), native coronary artery 01/11/2018   Non-STEMI with severe three-vessel disease noted July 2019  . Depression   . Diabetes mellitus without complication ([place])   . Diabetic gastroparesis ([place])   . Diabetic neuropathy, type I diabetes mellitus ([place]) 05/18/2015  . DKA (diabetic ketoacidoses) ([place]) 06/16/2017  . DKA, type 1 ([place]) 03/18/2016  . Essential hypertension   . Gastroparesis   . GERD (gastroesophageal reflux disease)   . HLD (hyperlipidemia)   . MDD (major depressive disorder), single episode, severe , no psychosis ([place])   . Non-ST elevation (NSTEMI) myocardial infarction ([place])   . S/P CABG x 3 01/14/2018  . Tardive dyskinesia   . Vitamin B12 deficiency 08/16/2015    Patient Active Problem List   Diagnosis Date Noted   . Acute encephalopathy 12/22/2018  . Diabetic ketoacidosis ([place]) 04/30/2018  . Gastroparesis 04/13/2018  . DKA (diabetic ketoacidosis) ([place]) 03/31/2018  . S/P CABG x 3 01/14/2018  . CAD (coronary artery disease), native coronary artery 01/11/2018  . Dehydration   . MDD (major depressive disorder), single episode, severe , no psychosis ([place])   . DKA (diabetic ketoacidoses) ([place]) 06/16/2017  . Tardive dyskinesia   . DKA, type 1 ([place]) 03/18/2016  . Noncompliance with treatment plan 03/13/2016  . Essential hypertension 01/24/2016  . HLD (hyperlipidemia)   . GERD (gastroesophageal reflux disease)   . AKI (acute kidney injury) ([place])   . Anemia, iron deficiency   . Vitamin B12 deficiency 08/16/2015  . Diabetic gastroparesis ([place])   . Diabetic neuropathy, type I diabetes mellitus ([place]) 05/18/2015  . Anxiety and depression 05/18/2015    Past Surgical History:  Procedure Laterality Date  . CARDIAC SURGERY    . COLONOSCOPY  09/27/2014   at [facility]  . CORONARY ARTERY BYPASS GRAFT N/A 01/14/2018   Procedure: CORONARY ARTERY BYPASS GRAFTING (CABG)X3, RIGHT AND LEFT SAPHENOUS VEIN HARVEST, MAMMARY TAKE DOWN. MAMMARY TO LAD, SVG TO PD, SVG TO DISTAL CIRC.;  Surgeon: Grace Isaac, MD;  Location: [facility];  Service: Open Heart Surgery;  Laterality: N/A;  . ESOPHAGOGASTRODUODENOSCOPY  09/27/2014   at [facility], Dr Rolan Lipa. biospy neg for celiac, neg for H pylori.   . EYE SURGERY    . gailstones    .  IR FLUORO GUIDE CV LINE RIGHT  02/01/2017  . IR FLUORO GUIDE CV LINE RIGHT  03/06/2017  . IR FLUORO GUIDE CV LINE RIGHT  03/25/2017  . IR GENERIC HISTORICAL  01/24/2016   IR FLUORO GUIDE CV LINE RIGHT 01/24/2016 Darrell K Allred, PA-C [facility]  . IR GENERIC HISTORICAL  01/24/2016   IR US GUIDE VASC ACCESS RIGHT 01/24/2016 Darrell K Allred, PA-C [facility]  . IR US GUIDE VASC ACCESS RIGHT  02/01/2017  . IR US GUIDE VASC ACCESS RIGHT  03/06/2017  . IR US GUIDE VASC ACCESS RIGHT  03/25/2017  . LEFT  HEART CATH AND CORONARY ANGIOGRAPHY N/A 01/07/2018   Procedure: LEFT HEART CATH AND CORONARY ANGIOGRAPHY;  Surgeon: Troy Sine, MD;  Location: [facility] CV [facility];  Service: Cardiovascular;  Laterality: N/A;  . POSTERIOR VITRECTOMY AND MEMBRANE PEEL-LEFT EYE  09/28/2002  . POSTERIOR VITRECTOMY AND MEMBRANE PEEL-RIGHT EYE  03/16/2002  . RETINAL DETACHMENT SURGERY    . TEE WITHOUT CARDIOVERSION N/A 01/14/2018   Procedure: TRANSESOPHAGEAL ECHOCARDIOGRAM (TEE);  Surgeon: Grace Isaac, MD;  Location: [facility];  Service: Open Heart Surgery;  Laterality: N/A;     OB History    Gravida  2   Para      Term      Preterm      AB  1   Living  1     SAB      TAB  1   Ectopic      Multiple      Live Births               Home Medications    Prior to Admission medications   Medication Sig Start Date End Date Taking? Authorizing Provider  ammonium lactate (AMLACTIN) 12 % cream Apply topically as needed for dry skin. 07/08/18  Yes Trula Slade, DPM  aspirin EC 81 MG tablet Take 1 tablet (81 mg total) by mouth daily. 06/03/18  Yes Nahser, Wonda Cheng, MD  Blood Glucose Monitoring Suppl (ONETOUCH VERIO) w/Device KIT Use as directed 03/21/18  Yes Newlin, [name], MD  busPIRone (BUSPAR) 10 MG tablet Take 1 tablet (10 mg total) by mouth 3 (three) times daily. 06/12/18  Yes [name], MD  diphenhydrAMINE (BENADRYL) 25 mg capsule Take 25 mg by mouth every 6 (six) hours as needed for allergies.   Yes [provider]  docusate sodium (COLACE) 100 MG capsule Take 1 capsule (100 mg total) by mouth daily as needed for mild constipation. 02/03/18  Yes Burtis Junes, NP  glucose blood (ACCU-CHEK AVIVA PLUS) test strip Use as instructed to test blood sugar 4 times daily Dx E10.65 12/18/18  Yes Elayne Snare, MD  insulin aspart (NOVOLOG) 100 UNIT/ML injection Inject 0-12 Units into the skin 3 (three) times daily before meals. As per sliding scale 10/14/18  Yes Elayne Snare, MD   Insulin Glargine (LANTUS SOLOSTAR) 100 UNIT/ML Solostar Pen Inject 15 units under the skin once daily in the morning. 10/14/18  Yes Elayne Snare, MD  Insulin Syringe-Needle U-100 (BD INSULIN SYRINGE ULTRAFINE) 31G X 5/16" 0.5 ML MISC 1 each by Does not apply route 4 (four) times daily. 04/28/18  Yes Elayne Snare, MD  Lancets [facility] DELICA PLUS [id number]) [place] 1 Units by Does not apply route as directed. 03/21/18  Yes [name], MD  loperamide (IMODIUM) 2 MG capsule Take 1 capsule (2 mg total) by mouth 3 (three) times daily as needed for diarrhea or loose stools. 12/25/18  Yes Aline August, MD  metoprolol tartrate (LOPRESSOR) 25 MG tablet Take 0.5 tablets (12.5 mg total) by mouth 2 (two) times daily. 10/08/18  Yes Nahser, Wonda Cheng, MD  ondansetron (ZOFRAN) 4 MG tablet Take 1 tablet (4 mg total) by mouth every 8 (eight) hours as needed for nausea or vomiting. 12/25/18 12/25/19 Yes Aline August, MD  pantoprazole (PROTONIX) 40 MG tablet Take 1 tablet (40 mg total) by mouth daily. 06/12/18  Yes [name], MD  pregabalin (LYRICA) 150 MG capsule Take 1 capsule (150 mg total) by mouth 2 (two) times daily. 01/02/19  Yes Elayne Snare, MD  promethazine (PHENERGAN) 12.5 MG tablet Take 1 tablet (12.5 mg total) by mouth every 8 (eight) hours as needed for up to 30 doses for nausea or vomiting. 12/17/18  Yes Amin, Ankit Chirag, MD  pyridostigmine (MESTINON) 60 MG tablet Take 60 mg by mouth 3 (three) times daily.   Yes [provider]    Family History Family History  Problem Relation Age of Onset  . Cystic fibrosis Mother   . Hypertension Father   . Diabetes Brother   . Hypertension Maternal Grandmother     Social History Social History   Tobacco Use  . Smoking status: Never Smoker  . Smokeless tobacco: Never Used  Substance Use Topics  . Alcohol use: No  . Drug use: Yes    Types: Marijuana    Comment: occ     Allergies   Anesthetics, amide; Penicillins; Buprenorphine hcl;  Encainide; and Metoclopramide   Review of Systems Review of Systems  Unable to perform ROS: Mental status change  Constitutional: Negative for chills and fever.  Gastrointestinal: Positive for diarrhea, nausea and vomiting.     Physical Exam Updated Vital Signs BP (!) 180/80   Pulse (!) 105   Temp 98.3 F (36.8 C) (Rectal)   Resp (!) 32   SpO2 100%   Physical Exam Vitals signs and nursing note reviewed.  Constitutional:      General: She is not in acute distress.    Appearance: She is ill-appearing and diaphoretic.  HENT:     Head: Normocephalic.     Comments: Rhythmic smacking of the lips Eyes:     Conjunctiva/sclera: Conjunctivae normal.  Neck:     Musculoskeletal: Normal range of motion and neck supple.  Cardiovascular:     Rate and Rhythm: Regular rhythm. Tachycardia present.     Heart sounds: No murmur. No friction rub. No gallop.   Pulmonary:     Effort: No respiratory distress.     Breath sounds: No stridor. No wheezing, rhonchi or rales.     Comments: Tachypneic Chest:     Chest wall: No tenderness.  Abdominal:     General: There is no distension.     Palpations: Abdomen is soft. There is no mass.     Tenderness: There is abdominal tenderness. There is no left CVA tenderness or rebound.     Hernia: No hernia is present.     Comments: Diffusely tender to palpation throughout the abdomen without rebound or guarding.  Musculoskeletal:     Comments: Bilateral upper extremities are contracted  Skin:    General: Skin is warm.     Findings: No rash.     Comments: Scars are noted to the skin overlying bilateral external jugular veins and to the bilateral AC areas.   Neurological:     Mental Status: She is alert.  Psychiatric:        Behavior:  Behavior normal.      ED Treatments / Results  Labs (all labs ordered are listed, but only abnormal results are displayed) Labs Reviewed  CBG MONITORING, ED - Abnormal; Notable for the following components:       Result Value   Glucose-Capillary 317 (*)    All other components within normal limits  CBC WITH DIFFERENTIAL/PLATELET  COMPREHENSIVE METABOLIC PANEL  LIPASE, BLOOD  URINALYSIS, ROUTINE W REFLEX MICROSCOPIC  LACTIC ACID, PLASMA  LACTIC ACID, PLASMA    EKG None  Radiology No results found.  Procedures .Critical Care Performed by: Joanne Gavel, PA-C Authorized by: Joanne Gavel, PA-C   Critical care provider statement:    Critical care time (minutes):  75   Critical care time was exclusive of:  Separately billable procedures and treating other patients and teaching time   Critical care was necessary to treat or prevent imminent or life-threatening deterioration of the following conditions:  Metabolic crisis   Critical care was time spent personally by me on the following activities:  Discussions with consultants, evaluation of patient's response to treatment, examination of patient, ordering and performing treatments and interventions, ordering and review of laboratory studies, pulse oximetry, re-evaluation of patient's condition, obtaining history from patient or surrogate, review of old charts, vascular access procedures and blood draw for specimens   I assumed direction of critical care for this patient from another provider in my specialty: no    IO LINE INSERTION  Date/Time: 01/20/2019 7:55 AM Performed by: Joanne Gavel, PA-C Authorized by: Joanne Gavel, PA-C   Consent:    Consent obtained:  Emergent situation Pre-procedure details:    Site preparation:  Alcohol Anesthesia (see MAR for exact dosages):    Anesthesia method:  None (allergy to local anesthetics) Procedure details:    Insertion site:  L proximal tibia   Insertion device:  Drill device   Insertion: Needle was inserted through the bony cortex     Number of attempts:  1   Insertion confirmation:  Aspiration of blood/marrow, easy infusion of fluids and stability of the needle Post-procedure details:     Secured with:  Transparent dressing, leg board, protective shield and tape   Patient tolerance of procedure:  Tolerated well, no immediate complications   (including critical care time)  Medications Ordered in ED Medications  sodium chloride 0.9 % bolus 1,000 mL (has no administration in time range)  promethazine (PHENERGAN) injection 25 mg (has no administration in time range)  ondansetron (ZOFRAN) injection 4 mg (has no administration in time range)     Initial Impression / Assessment and Plan / ED Course  I have reviewed the triage vital signs and the nursing notes.  Pertinent labs & imaging results that were available during my care of the patient were reviewed by me and considered in my medical decision making (see chart for details).        53 year old female with history of tardive dyskinesia, DM Type 1, previous episodes of DKA, AKI, CAD s/p CABG, HTN, and gastroparesis who presents to the emergency department with nausea, vomiting, diarrhea over the last 24 hours.  She is likely here with DKA.    She has continued to actively vomit while in the ER, but at times has been noted to attempt to put her fingers down her throat in an attempt to make herself retch.  Over the course of several hours, many attempts were made to obtain IV access:   - Patient's RN was  unable to obtain peripheral IV. - IV team was consulted who attempted to obtain peripheral access on the patient with [place] for over an hour.  - Second RN had multiple attempts at obtaining an [place] IV peripherally without success.  - Following IV team consult, nursing staff attempted a right EJ without success while I was at bedside.  - I attempted left EJ without success. I spoke with Dr. Rolland Porter, attending physician. Physician does not perform central line placement. I attempted an IO of the left proximal tibia with good blood return and initiated IV fluids. At this time, critical care was contacted as the patient was  minimally tachycardic on arrival, but became increasingly diaphoretic, tachypneic, and tachycardic. - Spoke with Dr. Lucile Shutters who stated that I would receive a callback from critical care physician at [facility]. Physician reports that on her chart review that patient required IV placement on multiple prior visits and recommended I consult IR for emergent access. Critical care physican also reports that critical care will not admit the patient as she is typically minimally in DKA and corrects shortly after treatments are initiated. Critical care consult appreciated. - Consulted IR and spoke with Dr. Laurence Ferrari who evaluated the patient and obtained access with 89F microsheath into the RUE brachial vein. IR consult very much appreciated.   IV fluids were initially initiated via IO.  Patient was given 10 units of subcutaneous insulin and IM Phenergan.  Labs are pending.  On re-evaluation after IV fluids were initiated, patient's mentation was slowly starting to improve. HR in the upper 90s.  She was no longer diaphoretic.  Patient care transferred to PA Petrucelli at the end of my shift pending labs and likely admission. Patient presentation, ED course, and plan of care discussed with review of all pertinent labs and imaging. Please see his/her note for further details regarding further ED course and disposition.  Final Clinical Impressions(s) / ED Diagnoses   Final diagnoses:  None    ED Discharge Orders    None       Avett Reineck A, PA-C 01/20/19 0818    Rolland Porter, MD 01/24/19 [phone]

## 2019-01-20 NOTE — ED Notes (Signed)
Blood from IO already hemolyzed before able to place in tubes, PA Mia made aware

## 2019-01-20 NOTE — Progress Notes (Signed)
RT attempted to complete ordered ABG. PT just left for IR (PICC placement). RN aware.

## 2019-01-20 NOTE — ED Provider Notes (Addendum)
07:00: Assumed care from the McDonald PA-C at change of shift pending labs and likely admission.  Please see prior provider note for full H&P.  Briefly patient is a type I diabetic with a history of gastroparesis, CAD, anxiety, depression &, hypertension who presented to the ED with complaints of N/V/D & hyperglycemia.   CBG with EMS 337. Difficult historian.  Difficulty with access, IR consulted, currently has IO in place receiving IV fluids.  Physical Exam  BP 140/74   Pulse 98   Temp 98.3 F (36.8 C) (Rectal)   Resp 17   SpO2 99%   Physical Exam Vitals signs and nursing note reviewed.  Constitutional:      General: She is not in acute distress.    Appearance: She is well-developed.  HENT:     Head: Normocephalic and atraumatic.  Eyes:     Conjunctiva/sclera: Conjunctivae normal.  Cardiovascular:     Rate and Rhythm: Normal rate.  Pulmonary:     Effort: Pulmonary effort is normal.  Neurological:     Mental Status: She is alert.    ED Course/Procedures     Results for orders placed or performed during the hospital encounter of 01/20/19  CBC with Differential  Result Value Ref Range   WBC 9.9 4.0 - 10.5 K/uL   RBC 3.83 (L) 3.87 - 5.11 MIL/uL   Hemoglobin 10.1 (L) 12.0 - 15.0 g/dL   HCT 33.5 (L) 36.0 - 46.0 %   MCV 87.5 80.0 - 100.0 fL   MCH 26.4 26.0 - 34.0 pg   MCHC 30.1 30.0 - 36.0 g/dL   RDW 14.6 11.5 - 15.5 %   Platelets 399 150 - 400 K/uL   nRBC 0.0 0.0 - 0.2 %   Neutrophils Relative % 92 %   Neutro Abs 9.1 (H) 1.7 - 7.7 K/uL   Lymphocytes Relative 6 %   Lymphs Abs 0.6 (L) 0.7 - 4.0 K/uL   Monocytes Relative 2 %   Monocytes Absolute 0.2 0.1 - 1.0 K/uL   Eosinophils Relative 0 %   Eosinophils Absolute 0.0 0.0 - 0.5 K/uL   Basophils Relative 0 %   Basophils Absolute 0.0 0.0 - 0.1 K/uL   Immature Granulocytes 0 %   Abs Immature Granulocytes 0.03 0.00 - 0.07 K/uL  Comprehensive metabolic panel  Result Value Ref Range   Sodium 142 135 - 145 mmol/L   Potassium 3.8 3.5 - 5.1 mmol/L   Chloride 111 98 - 111 mmol/L   CO2 14 (L) 22 - 32 mmol/L   Glucose, Bld 366 (H) 70 - 99 mg/dL   BUN 20 6 - 20 mg/dL   Creatinine, Ser 1.24 (H) 0.44 - 1.00 mg/dL   Calcium 8.9 8.9 - 10.3 mg/dL   Total Protein 8.1 6.5 - 8.1 g/dL   Albumin 4.0 3.5 - 5.0 g/dL   AST 18 15 - 41 U/L   ALT 13 0 - 44 U/L   Alkaline Phosphatase 87 38 - 126 U/L   Total Bilirubin 1.1 0.3 - 1.2 mg/dL   GFR calc non Af Amer 50 (L) >60 mL/min   GFR calc Af Amer 58 (L) >60 mL/min   Anion gap 17 (H) 5 - 15  Lipase, blood  Result Value Ref Range   Lipase 24 11 - 51 U/L  Urinalysis, Routine w reflex microscopic  Result Value Ref Range   Color, Urine YELLOW YELLOW   APPearance HAZY (A) CLEAR   Specific Gravity, Urine 1.021 1.005 - 1.030  pH 6.0 5.0 - 8.0   Glucose, UA >=500 (A) NEGATIVE mg/dL   Hgb urine dipstick SMALL (A) NEGATIVE   Bilirubin Urine NEGATIVE NEGATIVE   Ketones, ur 80 (A) NEGATIVE mg/dL   Protein, ur 100 (A) NEGATIVE mg/dL   Nitrite POSITIVE (A) NEGATIVE   Leukocytes,Ua TRACE (A) NEGATIVE   RBC / HPF 0-5 0 - 5 RBC/hpf   WBC, UA 6-10 0 - 5 WBC/hpf   Bacteria, UA MANY (A) NONE SEEN   Squamous Epithelial / LPF 0-5 0 - 5  Lactic acid, plasma  Result Value Ref Range   Lactic Acid, Venous 3.0 (HH) 0.5 - 1.9 mmol/L  CBG monitoring, ED  Result Value Ref Range   Glucose-Capillary 317 (H) 70 - 99 mg/dL   Comment 1 Notify RN   I-stat chem 8, ED (not at [facility] or [name])  Result Value Ref Range   Sodium 142 135 - 145 mmol/L   Potassium 3.8 3.5 - 5.1 mmol/L   Chloride 116 (H) 98 - 111 mmol/L   BUN 18 6 - 20 mg/dL   Creatinine, Ser 0.90 0.44 - 1.00 mg/dL   Glucose, Bld 366 (H) 70 - 99 mg/dL   Calcium, Ion 1.24 1.15 - 1.40 mmol/L   TCO2 16 (L) 22 - 32 mmol/L   Hemoglobin 11.2 (L) 12.0 - 15.0 g/dL   HCT 33.0 (L) 36.0 - 46.0 %   Dg Chest Portable 1 View  Result Date: 12/22/2018 CLINICAL DATA:  Central line placement EXAM: PORTABLE CHEST 1 VIEW COMPARISON:  December 15, 2018 FINDINGS: The left-sided central venous catheter tip terminates over the SVC. Again noted are postsurgical changes related to prior median sternotomy. There is no evidence of a left-sided pneumothorax. There is mild volume overload. There may be a trace right-sided pleural effusion. There is no acute osseous abnormality. IMPRESSION: 1. Left-sided central venous catheter tip terminates over the proximal SVC. There is no evidence for left-sided pneumothorax. 2. Mild volume overload. 3. Status post median sternotomy. Electronically Signed   By: Constance Holster M.D.   On: 12/22/2018 02:36     .Critical Care Performed by: Amaryllis Dyke, PA-C Authorized by: Amaryllis Dyke, PA-C    -- No additional critical care performed. Please see prior provider note w/ critical care documentation.   MDM   Work-up reviewed: CBG: 317 CBC: Anemia similar to prior.  No leukocytosis. CMP & UA: Consistent with DKA given her elevated anion gap, acidosis, hyperglycemia, & ketonuria.  No significant electrolyte derangement.  Renal function fairly similar to prior.  There is concern for UTI with positive nitrites and many bacteria, will culture, will start Rocephin. Lactic acidosis present.   08:10: RE-EVAL: Appears to be resting more comfortably.   Patient appears to be in DKA w/ UTI. Insulin drip & Rocephin to be started in the ED. She is receiving fluids currently. Will consult hospitalist service for admission.   08:32: CONSULT: Discussed w/ hospitalist Dr. Sloan Leiter who accepts admission.        Amaryllis Dyke, PA-C 01/20/19 0841    Amaryllis Dyke, PA-C 01/20/19 0844    Jola Schmidt, MD 01/20/19 [phone]

## 2019-01-20 NOTE — ED Notes (Signed)
IV team stating they were unsuccessful getting an IV place, this nurse and PA Mia at bedside

## 2019-01-20 NOTE — ED Provider Notes (Signed)
Medical screening examination/treatment/procedure(s) were conducted as a shared visit with non-physician practitioner(s) and myself.  I personally evaluated the patient during the encounter.     I was present in the room to assist a PA in inserting the IO.  There was good blood return.  Patient has poorly controlled diabetes and frequent admissions for DKA.  She is presenting today with similar symptoms.  Rolland Porter, MD, Barbette Or, MD 01/20/19 [phone]

## 2019-01-20 NOTE — Progress Notes (Signed)
Writer @ bedside for PIV placement per consult. Writer unsuccessful with [place] x2. Attempted once without US guidance(Unsuccessful). PA @ bedside aware.

## 2019-01-20 NOTE — ED Notes (Signed)
IV team at bedside 

## 2019-01-20 NOTE — ED Notes (Signed)
Pt placed on bedpan and pants changed.

## 2019-01-20 NOTE — ED Notes (Signed)
ED TO INPATIENT HANDOFF REPORT  ED Nurse Name and Phone #: [phone]  S Name/Age/Gender [name] 53 y.o. female Room/Bed: WA16/WA16  Code Status   Code Status: Prior  Home/SNF/Other Home Patient oriented to: self, place, time and situation Is this baseline? Yes   Triage Complete: Triage complete  Chief Complaint Nausea; Emesis  Triage Note Arrives via EMS from home. Endorses N/V/D all day, hx of gastroparesis and DKA, CBG 337. Patient teary and sticking fingers down throat in hallway.    Allergies Allergies  Allergen Reactions  . Anesthetics, Amide Nausea And Vomiting  . Penicillins Diarrhea, Nausea And Vomiting and Other (See Comments)    Has patient had a PCN reaction causing immediate rash, facial/tongue/throat swelling, SOB or lightheadedness with hypotension: Yes Has patient had a PCN reaction causing severe rash involving mucus membranes or skin necrosis: No Has patient had a PCN reaction that required hospitalization No Has patient had a PCN reaction occurring within the last 10 years: Yes  If all of the above answers are "NO", then may proceed with Cephalosporin use.   . Buprenorphine Hcl Rash  . Encainide Nausea And Vomiting  . Metoclopramide Other (See Comments)    Dystonia, muscle rigidity.  Patient states she is only allergic to the "pill form, not the IV"    Level of Care/Admitting Diagnosis ED Disposition    ED Disposition Condition [place]: [place] [[id number]]  Level of Care: Stepdown [14]  Admit to SDU based on following criteria: Severe physiological/psychological symptoms:  Any diagnosis requiring assessment & intervention at least every 4 hours on an ongoing basis to obtain desired patient outcomes including stability and rehabilitation  Covid Evaluation: Asymptomatic Screening Protocol (No Symptoms)  Diagnosis: DKA (diabetic ketoacidoses) [facility]) [[id number]]  Admitting Physician: Barb Merino  [[id number]]  Attending Physician: Barb Merino [[id number]]  Estimated length of stay: past midnight tomorrow  Certification:: I certify this patient will need inpatient services for at least 2 midnights  PT Class (Do Not Modify): Inpatient [101]  PT Acc Code (Do Not Modify): Private [1]       B Medical/Surgery History Past Medical History:  Diagnosis Date  . AKI (acute kidney injury) ([place])   . Anemia, iron deficiency   . Anxiety and depression 05/18/2015  . CAD (coronary artery disease), native coronary artery 01/11/2018   Non-STEMI with severe three-vessel disease noted July 2019  . Depression   . Diabetes mellitus without complication ([place])   . Diabetic gastroparesis ([place])   . Diabetic neuropathy, type I diabetes mellitus ([place]) 05/18/2015  . DKA (diabetic ketoacidoses) ([place]) 06/16/2017  . DKA, type 1 ([place]) 03/18/2016  . Essential hypertension   . Gastroparesis   . GERD (gastroesophageal reflux disease)   . HLD (hyperlipidemia)   . MDD (major depressive disorder), single episode, severe , no psychosis ([place])   . Non-ST elevation (NSTEMI) myocardial infarction ([place])   . S/P CABG x 3 01/14/2018  . Tardive dyskinesia   . Vitamin B12 deficiency 08/16/2015   Past Surgical History:  Procedure Laterality Date  . CARDIAC SURGERY    . COLONOSCOPY  09/27/2014   at [facility]  . CORONARY ARTERY BYPASS GRAFT N/A 01/14/2018   Procedure: CORONARY ARTERY BYPASS GRAFTING (CABG)X3, RIGHT AND LEFT SAPHENOUS VEIN HARVEST, MAMMARY TAKE DOWN. MAMMARY TO LAD, SVG TO PD, SVG TO DISTAL CIRC.;  Surgeon: Grace Isaac, MD;  Location: [place];  Service: Open Heart Surgery;  Laterality: N/A;  . ESOPHAGOGASTRODUODENOSCOPY  09/27/2014   at [facility], Dr Rolan Lipa. biospy neg for celiac, neg for H pylori.   . EYE SURGERY    . gailstones    . IR FLUORO GUIDE CV LINE RIGHT  02/01/2017  . IR FLUORO GUIDE CV LINE RIGHT  03/06/2017  . IR FLUORO GUIDE CV LINE RIGHT  03/25/2017  . IR GENERIC HISTORICAL  01/24/2016   IR  FLUORO GUIDE CV LINE RIGHT 01/24/2016 Darrell K Allred, PA-C [facility]  . IR GENERIC HISTORICAL  01/24/2016   IR US GUIDE VASC ACCESS RIGHT 01/24/2016 Darrell K Allred, PA-C [facility]  . IR US GUIDE VASC ACCESS RIGHT  02/01/2017  . IR US GUIDE VASC ACCESS RIGHT  03/06/2017  . IR US GUIDE VASC ACCESS RIGHT  03/25/2017  . LEFT HEART CATH AND CORONARY ANGIOGRAPHY N/A 01/07/2018   Procedure: LEFT HEART CATH AND CORONARY ANGIOGRAPHY;  Surgeon: Troy Sine, MD;  Location: [place] CV LAB;  Service: Cardiovascular;  Laterality: N/A;  . POSTERIOR VITRECTOMY AND MEMBRANE PEEL-LEFT EYE  09/28/2002  . POSTERIOR VITRECTOMY AND MEMBRANE PEEL-RIGHT EYE  03/16/2002  . RETINAL DETACHMENT SURGERY    . TEE WITHOUT CARDIOVERSION N/A 01/14/2018   Procedure: TRANSESOPHAGEAL ECHOCARDIOGRAM (TEE);  Surgeon: Grace Isaac, MD;  Location: [place];  Service: Open Heart Surgery;  Laterality: N/A;     A IV Location/Drains/Wounds Patient Lines/Drains/Airways Status   Active Line/Drains/Airways    Name:   Placement date:   Placement time:   Site:   Days:   Peripheral IV 01/20/19 Right;Upper Arm   01/20/19    0741    Arm   less than 1   External Urinary Catheter   01/20/19    0305    -   less than 1   Incision (Closed) 01/14/18 Chest Mid   01/14/18    0855     371   Incision (Closed) 01/14/18 Leg Right   01/14/18    0855     371   Incision (Closed) 01/14/18 Leg Left   01/14/18    1014     371   Intraosseous Line 01/20/19 Tibia   01/20/19    0620    Left   less than 1          Intake/Output Last 24 hours  Intake/Output Summary (Last 24 hours) at 01/20/2019 1016 Last data filed at 01/20/2019 0303 Gross per 24 hour  Intake -  Output 1 ml  Net -1 ml    Labs/Imaging Results for orders placed or performed during the hospital encounter of 01/20/19 (from the past 48 hour(s))  CBG monitoring, ED     Status: Abnormal   Collection Time: 01/20/19  5:33 AM  Result Value Ref Range   Glucose-Capillary 317 (H) 70 -  99 mg/dL   Comment 1 Notify RN   Urinalysis, Routine w reflex microscopic     Status: Abnormal   Collection Time: 01/20/19  6:07 AM  Result Value Ref Range   Color, Urine YELLOW YELLOW   APPearance HAZY (A) CLEAR   Specific Gravity, Urine 1.021 1.005 - 1.030   pH 6.0 5.0 - 8.0   Glucose, UA >=500 (A) NEGATIVE mg/dL   Hgb urine dipstick SMALL (A) NEGATIVE   Bilirubin Urine NEGATIVE NEGATIVE   Ketones, ur 80 (A) NEGATIVE mg/dL   Protein, ur 100 (A) NEGATIVE mg/dL   Nitrite POSITIVE (A) NEGATIVE   Leukocytes,Ua TRACE (A) NEGATIVE   RBC / HPF 0-5 0 - 5 RBC/hpf  WBC, UA 6-10 0 - 5 WBC/hpf   Bacteria, UA MANY (A) NONE SEEN   Squamous Epithelial / LPF 0-5 0 - 5    Comment: Performed at [facility], [place] [address] [place], [place] [address]  CBC with Differential     Status: Abnormal   Collection Time: 01/20/19  7:31 AM  Result Value Ref Range   WBC 9.9 4.0 - 10.5 K/uL   RBC 3.83 (L) 3.87 - 5.11 MIL/uL   Hemoglobin 10.1 (L) 12.0 - 15.0 g/dL   HCT 33.5 (L) 36.0 - 46.0 %   MCV 87.5 80.0 - 100.0 fL   MCH 26.4 26.0 - 34.0 pg   MCHC 30.1 30.0 - 36.0 g/dL   RDW 14.6 11.5 - 15.5 %   Platelets 399 150 - 400 K/uL   nRBC 0.0 0.0 - 0.2 %   Neutrophils Relative % 92 %   Neutro Abs 9.1 (H) 1.7 - 7.7 K/uL   Lymphocytes Relative 6 %   Lymphs Abs 0.6 (L) 0.7 - 4.0 K/uL   Monocytes Relative 2 %   Monocytes Absolute 0.2 0.1 - 1.0 K/uL   Eosinophils Relative 0 %   Eosinophils Absolute 0.0 0.0 - 0.5 K/uL   Basophils Relative 0 %   Basophils Absolute 0.0 0.0 - 0.1 K/uL   Immature Granulocytes 0 %   Abs Immature Granulocytes 0.03 0.00 - 0.07 K/uL    Comment: Performed at [facility], [place] [address], [place], [place] [address]  Comprehensive metabolic panel     Status: Abnormal   Collection Time: 01/20/19  7:31 AM  Result Value Ref Range   Sodium 142 135 - 145 mmol/L   Potassium 3.8 3.5 - 5.1 mmol/L   Chloride 111 98 - 111 mmol/L   CO2 14 (L) 22 - 32  mmol/L   Glucose, Bld 366 (H) 70 - 99 mg/dL   BUN 20 6 - 20 mg/dL   Creatinine, Ser 1.24 (H) 0.44 - 1.00 mg/dL   Calcium 8.9 8.9 - 10.3 mg/dL   Total Protein 8.1 6.5 - 8.1 g/dL   Albumin 4.0 3.5 - 5.0 g/dL   AST 18 15 - 41 U/L   ALT 13 0 - 44 U/L   Alkaline Phosphatase 87 38 - 126 U/L   Total Bilirubin 1.1 0.3 - 1.2 mg/dL   GFR calc non Af Amer 50 (L) >60 mL/min   GFR calc Af Amer 58 (L) >60 mL/min   Anion gap 17 (H) 5 - 15    Comment: Performed at [facility], [place] [address] [place], [place] [address]  Lipase, blood     Status: None   Collection Time: 01/20/19  7:31 AM  Result Value Ref Range   Lipase 24 11 - 51 U/L    Comment: Performed at [facility], [place] [address], [place], [place] [address]  Lactic acid, plasma     Status: Abnormal   Collection Time: 01/20/19  7:31 AM  Result Value Ref Range   Lactic Acid, Venous 3.0 (HH) 0.5 - 1.9 mmol/L    Comment: CRITICAL RESULT CALLED TO, READ BACK BY AND VERIFIED WITH: WEST,S @ 0804 ON [id number] BY POTEAT,S Performed at [place] [address], [place], [place] [address]   I-stat chem 8, ED (not at [facility] or [facility])     Status: Abnormal   Collection Time: 01/20/19  7:38 AM  Result Value Ref Range   Sodium 142 135 -  145 mmol/L   Potassium 3.8 3.5 - 5.1 mmol/L   Chloride 116 (H) 98 - 111 mmol/L   BUN 18 6 - 20 mg/dL   Creatinine, Ser 0.90 0.44 - 1.00 mg/dL   Glucose, Bld 366 (H) 70 - 99 mg/dL   Calcium, Ion 1.24 1.15 - 1.40 mmol/L   TCO2 16 (L) 22 - 32 mmol/L   Hemoglobin 11.2 (L) 12.0 - 15.0 g/dL   HCT 33.0 (L) 36.0 - 46.0 %  SARS Coronavirus 2 (CEPHEID - Performed in [place] hospital lab), Hosp Order     Status: None   Collection Time: 01/20/19  8:50 AM   Specimen: Nasopharyngeal Swab  Result Value Ref Range   SARS Coronavirus 2 NEGATIVE NEGATIVE    Comment: (NOTE) If result is NEGATIVE SARS-CoV-2 target nucleic acids are NOT DETECTED. The SARS-CoV-2 RNA is  generally detectable in upper and lower  respiratory specimens during the acute phase of infection. The lowest  concentration of SARS-CoV-2 viral copies this assay can detect is 250  copies / mL. A negative result does not preclude SARS-CoV-2 infection  and should not be used as the sole basis for treatment or other  patient management decisions.  A negative result may occur with  improper specimen collection / handling, submission of specimen other  than nasopharyngeal swab, presence of viral mutation(s) within the  areas targeted by this assay, and inadequate number of viral copies  (<250 copies / mL). A negative result must be combined with clinical  observations, patient history, and epidemiological information. If result is POSITIVE SARS-CoV-2 target nucleic acids are DETECTED. The SARS-CoV-2 RNA is generally detectable in upper and lower  respiratory specimens dur ing the acute phase of infection.  Positive  results are indicative of active infection with SARS-CoV-2.  Clinical  correlation with patient history and other diagnostic information is  necessary to determine patient infection status.  Positive results do  not rule out bacterial infection or co-infection with other viruses. If result is PRESUMPTIVE POSTIVE SARS-CoV-2 nucleic acids MAY BE PRESENT.   A presumptive positive result was obtained on the submitted specimen  and confirmed on repeat testing.  While 2019 novel coronavirus  (SARS-CoV-2) nucleic acids may be present in the submitted sample  additional confirmatory testing may be necessary for epidemiological  and / or clinical management purposes  to differentiate between  SARS-CoV-2 and other Sarbecovirus currently known to infect humans.  If clinically indicated additional testing with an alternate test  methodology [phone]) is advised. The SARS-CoV-2 RNA is generally  detectable in upper and lower respiratory sp ecimens during the acute  phase of  infection. The expected result is Negative. Fact Sheet for Patients:  StrictlyIdeas.no Fact Sheet for Healthcare Providers: BankingDealers.co.za This test is not yet approved or cleared by the [place] FDA and has been authorized for detection and/or diagnosis of SARS-CoV-2 by FDA under an Emergency Use Authorization (EUA).  This EUA will remain in effect (meaning this test can be used) for the duration of the COVID-19 declaration under Section 564(b)(1) of the Act, 21 U.S.C. section 360bbb-3(b)(1), unless the authorization is terminated or revoked sooner. Performed at [facility], [place] [address] [place], [place] [address]    No results found.  Pending Labs [employer] (From admission, onward)    Start     Ordered   01/20/19 1017  Urine culture  Add-on,   AD     01/20/19 0721   01/20/19 0318  Lactic acid,  plasma  Now then every 2 hours,   STAT     01/20/19 0317   Signed and Held  Basic metabolic panel  Now then every 4 hours,   R     Signed and Held   Signed and Held  Magnesium  Now then every 8 hours,   R     Signed and Held   Signed and Held  Phosphorus  Now then every 8 hours,   R     Signed and Held          Vitals/Pain Today's Vitals   01/20/19 0830 01/20/19 0900 01/20/19 0930 01/20/19 1000  BP: (!) 149/68 (!) 164/80 (!) 175/78 (!) 173/82  Pulse: (!) 102 96 (!) 102 99  Resp: (!) 21 (!) 21 (!) 33 (!) 37  Temp:      TempSrc:      SpO2: 98% 100% 99% 100%  PainSc:        Isolation Precautions No active isolations  Medications Medications  insulin regular, human (MYXREDLIN) 100 units/ 100 mL infusion (has no administration in time range)  potassium chloride 10 mEq in 100 mL IVPB (has no administration in time range)  dextrose 5 %-0.45 % sodium chloride infusion (has no administration in time range)  cefTRIAXone (ROCEPHIN) 1 g in sodium chloride 0.9 % 100 mL IVPB (has no administration  in time range)  sodium chloride 0.9 % bolus 1,000 mL (0 mLs Intravenous Stopped 01/20/19 0849)  sodium chloride 0.9 % bolus 1,000 mL (0 mLs Intravenous Stopped 01/20/19 0849)  insulin aspart (novoLOG) injection 10 Units (10 Units Subcutaneous Given 01/20/19 0636)  promethazine (PHENERGAN) injection 25 mg (25 mg Intramuscular Given 01/20/19 0645)  fentaNYL (SUBLIMAZE) injection 75 mcg (75 mcg Intravenous Given 01/20/19 0742)  ondansetron (ZOFRAN) injection 4 mg (4 mg Intravenous Given 01/20/19 0742)  sodium chloride 0.9 % bolus 1,000 mL (1,000 mLs Intravenous New Bag/Given 01/20/19 0849)  cefTRIAXone (ROCEPHIN) 1 g in sodium chloride 0.9 % 100 mL IVPB (1 g Intravenous New Bag/Given 01/20/19 0849)    Mobility non-ambulatory Low fall risk   Focused Assessments Neuro Assessment Handoff:  Swallow screen pass? Yes          Neuro Assessment:   Neuro Checks:      Last Documented NIHSS Modified Score:   Has TPA been given? No If patient is a Neuro Trauma and patient is going to OR before floor call report to [place] nurse: [phone] or [phone]     R Recommendations: See Admitting Provider Note  Report given to:   Additional Notes:

## 2019-01-21 ENCOUNTER — Other Ambulatory Visit: Payer: Self-pay

## 2019-01-21 DIAGNOSIS — K3184 Gastroparesis: Secondary | ICD-10-CM

## 2019-01-21 DIAGNOSIS — E1143 Type 2 diabetes mellitus with diabetic autonomic (poly)neuropathy: Secondary | ICD-10-CM

## 2019-01-21 DIAGNOSIS — N179 Acute kidney failure, unspecified: Secondary | ICD-10-CM

## 2019-01-21 DIAGNOSIS — K219 Gastro-esophageal reflux disease without esophagitis: Secondary | ICD-10-CM

## 2019-01-21 LAB — BASIC METABOLIC PANEL
Anion gap: 4 — ABNORMAL LOW (ref 5–15)
Anion gap: 8 (ref 5–15)
Anion gap: 5 (ref 5–15)
BUN: 14 mg/dL (ref 6–20)
BUN: 13 mg/dL (ref 6–20)
BUN: 10 mg/dL (ref 6–20)
CO2: 20 mmol/L — ABNORMAL LOW (ref 22–32)
CO2: 18 mmol/L — ABNORMAL LOW (ref 22–32)
CO2: 20 mmol/L — ABNORMAL LOW (ref 22–32)
Calcium: 7.9 mg/dL — ABNORMAL LOW (ref 8.9–10.3)
Calcium: 7.5 mg/dL — ABNORMAL LOW (ref 8.9–10.3)
Calcium: 7.6 mg/dL — ABNORMAL LOW (ref 8.9–10.3)
Chloride: 119 mmol/L — ABNORMAL HIGH (ref 98–111)
Chloride: 118 mmol/L — ABNORMAL HIGH (ref 98–111)
Chloride: 115 mmol/L — ABNORMAL HIGH (ref 98–111)
Creatinine, Ser: 1.11 mg/dL — ABNORMAL HIGH (ref 0.44–1.00)
Creatinine, Ser: 1.09 mg/dL — ABNORMAL HIGH (ref 0.44–1.00)
Creatinine, Ser: 1.02 mg/dL — ABNORMAL HIGH (ref 0.44–1.00)
GFR calc Af Amer: >60 mL/min (ref >60)
GFR calc Af Amer: >60 mL/min (ref >60)
GFR calc Af Amer: >60 mL/min (ref >60)
GFR calc non Af Amer: 57 mL/min — ABNORMAL LOW (ref >60)
GFR calc non Af Amer: 59 mL/min — ABNORMAL LOW (ref >60)
GFR calc non Af Amer: >60 mL/min (ref >60)
Glucose, Bld: 296 mg/dL — ABNORMAL HIGH (ref 70–99)
Glucose, Bld: 108 mg/dL — ABNORMAL HIGH (ref 70–99)
Glucose, Bld: 93 mg/dL (ref 70–99)
Potassium: 3.1 mmol/L — ABNORMAL LOW (ref 3.5–5.1)
Potassium: 2.9 mmol/L — ABNORMAL LOW (ref 3.5–5.1)
Potassium: 3.2 mmol/L — ABNORMAL LOW (ref 3.5–5.1)
Sodium: 143 mmol/L (ref 135–145)
Sodium: 144 mmol/L (ref 135–145)
Sodium: 140 mmol/L (ref 135–145)

## 2019-01-21 LAB — CBC WITH DIFFERENTIAL/PLATELET
Abs Immature Granulocytes: 0.03 10*3/uL (ref 0.00–0.07)
Basophils Absolute: 0.1 10*3/uL (ref 0.0–0.1)
Basophils Relative: 1 %
Eosinophils Absolute: 0 10*3/uL (ref 0.0–0.5)
Eosinophils Relative: 0 %
HCT: 29.1 % — ABNORMAL LOW (ref 36.0–46.0)
Hemoglobin: 8.8 g/dL — ABNORMAL LOW (ref 12.0–15.0)
Immature Granulocytes: 0 %
Lymphocytes Relative: 15 %
Lymphs Abs: 1.4 10*3/uL (ref 0.7–4.0)
MCH: 26.7 pg (ref 26.0–34.0)
MCHC: 30.2 g/dL (ref 30.0–36.0)
MCV: 88.2 fL (ref 80.0–100.0)
Monocytes Absolute: 0.8 10*3/uL (ref 0.1–1.0)
Monocytes Relative: 9 %
Neutro Abs: 6.8 10*3/uL (ref 1.7–7.7)
Neutrophils Relative %: 75 %
Platelets: 367 10*3/uL (ref 150–400)
RBC: 3.3 MIL/uL — ABNORMAL LOW (ref 3.87–5.11)
RDW: 15.1 % (ref 11.5–15.5)
WBC: 9.1 10*3/uL (ref 4.0–10.5)
nRBC: 0 % (ref 0.0–0.2)

## 2019-01-21 LAB — GLUCOSE, CAPILLARY
Glucose-Capillary: 247 mg/dL — ABNORMAL HIGH (ref 70–99)
Glucose-Capillary: 111 mg/dL — ABNORMAL HIGH (ref 70–99)
Glucose-Capillary: 82 mg/dL (ref 70–99)
Glucose-Capillary: 112 mg/dL — ABNORMAL HIGH (ref 70–99)
Glucose-Capillary: 129 mg/dL — ABNORMAL HIGH (ref 70–99)
Glucose-Capillary: 128 mg/dL — ABNORMAL HIGH (ref 70–99)
Glucose-Capillary: 66 mg/dL — ABNORMAL LOW (ref 70–99)
Glucose-Capillary: 132 mg/dL — ABNORMAL HIGH (ref 70–99)
Glucose-Capillary: 56 mg/dL — ABNORMAL LOW (ref 70–99)
Glucose-Capillary: 181 mg/dL — ABNORMAL HIGH (ref 70–99)
Glucose-Capillary: 89 mg/dL (ref 70–99)

## 2019-01-21 LAB — PHOSPHORUS: Phosphorus: 1.9 mg/dL — ABNORMAL LOW (ref 2.5–4.6)

## 2019-01-21 LAB — MAGNESIUM: Magnesium: 2.1 mg/dL (ref 1.7–2.4)

## 2019-01-21 MED ORDER — POTASSIUM CHLORIDE 10 MEQ/100ML IV SOLN: 10.0000 meq | INTRAVENOUS | Status: DC

## 2019-01-21 MED ORDER — DEXTROSE 50 % IV SOLN
INTRAVENOUS | Status: AC
  Administered 2019-01-21: 25 mL
  Filled 2019-01-21: qty 50

## 2019-01-21 MED ORDER — SODIUM CHLORIDE 0.9 % IV SOLN
INTRAVENOUS | Status: DC
  Administered 2019-01-21 – 2019-01-22 (×3): via INTRAVENOUS

## 2019-01-21 MED ORDER — INSULIN ASPART 100 UNIT/ML ~~LOC~~ SOLN
0.0000 [IU] | Freq: Three times a day (TID) | SUBCUTANEOUS | Status: DC
  Administered 2019-01-21: 2 [IU] via SUBCUTANEOUS
  Administered 2019-01-21 – 2019-01-22 (×3): 3 [IU] via SUBCUTANEOUS
  Administered 2019-01-23: 5 [IU] via SUBCUTANEOUS
  Administered 2019-01-23: 8 [IU] via SUBCUTANEOUS

## 2019-01-21 MED ORDER — INSULIN ASPART 100 UNIT/ML ~~LOC~~ SOLN: 0.0000 [IU] | Freq: Every day | SUBCUTANEOUS | Status: DC

## 2019-01-21 MED ORDER — INSULIN GLARGINE 100 UNIT/ML ~~LOC~~ SOLN
10.0000 [IU] | Freq: Every day | SUBCUTANEOUS | Status: DC
  Administered 2019-01-21 (×2): 10 [IU] via SUBCUTANEOUS
  Filled 2019-01-21 (×3): qty 0.1

## 2019-01-21 MED ORDER — POTASSIUM PHOSPHATES 15 MMOLE/5ML IV SOLN
30.0000 mmol | Freq: Once | INTRAVENOUS | Status: AC
  Administered 2019-01-21: 30 mmol via INTRAVENOUS
  Filled 2019-01-21: qty 10

## 2019-01-21 MED ORDER — MORPHINE SULFATE (PF) 2 MG/ML IV SOLN: 1.0000 mg | INTRAVENOUS | Status: DC | PRN

## 2019-01-21 NOTE — Progress Notes (Signed)
Hosie Poisson, MD was paged regarding the pt's active tele orders. MD advised to d/c tele orders.

## 2019-01-21 NOTE — Progress Notes (Signed)
Patient CBG 66 at 1120. Patient given a cup of apple juice. Will recheck in 30 minutes

## 2019-01-21 NOTE — Progress Notes (Signed)
PROGRESS NOTE    [name]  RSW:[id number] DOB: 11/19/1965 DOA: 01/20/2019 PCP: [name], MD    Brief Narrative:   [name] is a 53 y.o. female with medical history significant of type 1 diabetes mellitus on insulin, tardive dyskinesia secondary to Reglan, coronary artery disease status post CABG, depression and severe gastroparesis on Mestinon was brought to the emergency room with 1 day of severe persistent abdominal pain and nausea. In the emergency room patient was persistently nauseous vomiting.  She was lethargic.  Bicarbonate was 14, creatinine was 1.24, blood sugars were 366, anion gap was 17 and lactic acid was 3.  She was aggressively hydrated after multiple attempts to get IV lines, 10 units of insulin bolus was given, multiple nausea medications were given and started on insulin drip because of persistent symptoms and unable to take p.o. Urinalysis shows grossly abnormal urine and was given a dose of Rocephin with cultures.  Assessment & Plan:   Principal Problem:   DKA (diabetic ketoacidoses) ([place]) Active Problems:   Diabetic neuropathy, type I diabetes mellitus ([place])   Anxiety and depression   Diabetic gastroparesis ([facility])   AKI (acute kidney injury) ([place])   HLD (hyperlipidemia)   GERD (gastroesophageal reflux disease)   Essential hypertension   Tardive dyskinesia   DKA: Improved. Her CBG'S are better, AG is closed.  But she continues to have nausea, dry heaving and abdomin al pain possibly from gastroparesis.  Recommend to continue with phenergan, and plan to add erythromycin for gastroparesis if her symptoms doesn't improve in the next 24 hours.  Continue with IV fluids, and transition to liquid diet.  CBG (last 3)  Recent Labs    01/21/19 0518 01/21/19 0741 01/21/19 1113  GLUCAP 129* 128* 66*     Gram negative rod UTI;  Continue with rocephin for now along with gentle hydration.    Tardive dyskinesia:  Hold the zofran for now.  Get 12  lead EKG to check Qtc.   Hypokalemia:  Replaced.   GERD;  Continue with protonix.    AKI:  Probably from pre renal azotemia, resume IV hydration and repeat renal parameters in am.     Hypophosphatemia:  Replace as appropriate.     DVT prophylaxis: scd's Code Status: full code.  Family Communication:none at bedside.  Disposition Plan: pending clinical improvement.   Consultants:   NONE.   Procedures:NONE.   Antimicrobials: rocephin for UTI.    Subjective: Nauseated, no appetite, loose stools.   Objective: Vitals:   01/21/19 0500 01/21/19 0700 01/21/19 0735 01/21/19 0800  BP: (!) 157/67 (!) 125/48  (!) 168/61  Pulse: 82 80  77  Resp: (!) 0 15  20  Temp:   98.3 F (36.8 C)   TempSrc:   Oral   SpO2: 100% 99%  100%  Weight:      Height:        Intake/Output Summary (Last 24 hours) at 01/21/2019 0851 Last data filed at 01/21/2019 0827 Gross per 24 hour  Intake 1704.96 ml  Output 125 ml  Net 1579.96 ml   Filed Weights   01/20/19 1100  Weight: 68 kg    Examination:  General exam: in mild distress from nausea, vomiting.  Respiratory system: Clear to auscultation. Respiratory effort normal. Cardiovascular system: S1 & S2 heard, RRR. No JVD, No pedal edema. Gastrointestinal system: Abdomen is soft, mild gen tenderness.  Central nervous system: Alert and oriented. No focal neurological deficits. Extremities: Symmetric 5 x 5 power. Skin: No  rashes, lesions or ulcers Psychiatry:  Mood & affect appropriate.     Data Reviewed: I have personally reviewed following labs and imaging studies  CBC: Recent Labs  Lab 01/20/19 0731 01/20/19 0738  WBC 9.9  --   NEUTROABS 9.1*  --   HGB 10.1* 11.2*  HCT 33.5* 33.0*  MCV 87.5  --   PLT 399  --    Basic Metabolic Panel: Recent Labs  Lab 01/20/19 0731 01/20/19 0738 01/20/19 1106 01/20/19 1726 01/20/19 2122 01/21/19 0113  NA 142 142 146* 144 145 143  K 3.8 3.8 4.6 3.3* 3.6 3.1*  CL 111 116* 119*  116* 119* 119*  CO2 14*  --  11* 16* 18* 20*  GLUCOSE 366* 366* 228* 147* 199* 296*  BUN 20 18 15 14 14 14   CREATININE 1.24* 0.90 1.03* 1.04* 1.09* 1.11*  CALCIUM 8.9  --  8.5* 8.5* 8.1* 7.9*  MG  --   --  1.9 2.0  --  2.1  PHOS  --   --  2.0* 1.9*  --  1.9*   GFR: Estimated Creatinine Clearance: 54.2 mL/min (A) (by C-G formula based on SCr of 1.11 mg/dL (H)). Liver Function Tests: Recent Labs  Lab 01/20/19 0731  AST 18  ALT 13  ALKPHOS 87  BILITOT 1.1  PROT 8.1  ALBUMIN 4.0   Recent Labs  Lab 01/20/19 0731  LIPASE 24   No results for input(s): AMMONIA in the last 168 hours. Coagulation Profile: No results for input(s): INR, PROTIME in the last 168 hours. Cardiac Enzymes: No results for input(s): CKTOTAL, CKMB, CKMBINDEX, TROPONINI in the last 168 hours. BNP (last 3 results) No results for input(s): PROBNP in the last 8760 hours. HbA1C: No results for input(s): HGBA1C in the last 72 hours. CBG: Recent Labs  Lab 01/21/19 0208 01/21/19 0313 01/21/19 0416 01/21/19 0518 01/21/19 0741  GLUCAP 111* 82 112* 129* 128*   Lipid Profile: No results for input(s): CHOL, HDL, LDLCALC, TRIG, CHOLHDL, LDLDIRECT in the last 72 hours. Thyroid Function Tests: No results for input(s): TSH, T4TOTAL, FREET4, T3FREE, THYROIDAB in the last 72 hours. Anemia Panel: No results for input(s): VITAMINB12, FOLATE, FERRITIN, TIBC, IRON, RETICCTPCT in the last 72 hours. Sepsis Labs: Recent Labs  Lab 01/20/19 0731  LATICACIDVEN 3.0*    Recent Results (from the past 240 hour(s))  Urine culture     Status: None (Preliminary result)   Collection Time: 01/20/19  7:31 AM   Specimen: Urine, Random  Result Value Ref Range Status   Specimen Description   Final    URINE, RANDOM Performed at [facility], [place] [address], [place], [place] [address]    Special Requests   Final    NONE Performed at [facility], [place] [address] [place], [place] [address]    Culture  PENDING  Incomplete   Report Status PENDING  Incomplete  SARS Coronavirus 2 (CEPHEID - Performed in [facility] hospital lab), Hosp Order     Status: None   Collection Time: 01/20/19  8:50 AM   Specimen: Nasopharyngeal Swab  Result Value Ref Range Status   SARS Coronavirus 2 NEGATIVE NEGATIVE Final    Comment: (NOTE) If result is NEGATIVE SARS-CoV-2 target nucleic acids are NOT DETECTED. The SARS-CoV-2 RNA is generally detectable in upper and lower  respiratory specimens during the acute phase of infection. The lowest  concentration of SARS-CoV-2 viral copies this assay can detect is 250  copies / mL. A negative  result does not preclude SARS-CoV-2 infection  and should not be used as the sole basis for treatment or other  patient management decisions.  A negative result may occur with  improper specimen collection / handling, submission of specimen other  than nasopharyngeal swab, presence of viral mutation(s) within the  areas targeted by this assay, and inadequate number of viral copies  (<250 copies / mL). A negative result must be combined with clinical  observations, patient history, and epidemiological information. If result is POSITIVE SARS-CoV-2 target nucleic acids are DETECTED. The SARS-CoV-2 RNA is generally detectable in upper and lower  respiratory specimens dur ing the acute phase of infection.  Positive  results are indicative of active infection with SARS-CoV-2.  Clinical  correlation with patient history and other diagnostic information is  necessary to determine patient infection status.  Positive results do  not rule out bacterial infection or co-infection with other viruses. If result is PRESUMPTIVE POSTIVE SARS-CoV-2 nucleic acids MAY BE PRESENT.   A presumptive positive result was obtained on the submitted specimen  and confirmed on repeat testing.  While 2019 novel coronavirus  (SARS-CoV-2) nucleic acids may be present in the submitted sample  additional  confirmatory testing may be necessary for epidemiological  and / or clinical management purposes  to differentiate between  SARS-CoV-2 and other Sarbecovirus currently known to infect humans.  If clinically indicated additional testing with an alternate test  methodology [id number]) is advised. The SARS-CoV-2 RNA is generally  detectable in upper and lower respiratory sp ecimens during the acute  phase of infection. The expected result is Negative. Fact Sheet for Patients:  StrictlyIdeas.no Fact Sheet for Healthcare Providers: BankingDealers.co.za This test is not yet approved or cleared by the [place] FDA and has been authorized for detection and/or diagnosis of SARS-CoV-2 by FDA under an Emergency Use Authorization (EUA).  This EUA will remain in effect (meaning this test can be used) for the duration of the COVID-19 declaration under Section 564(b)(1) of the Act, 21 U.S.C. section 360bbb-3(b)(1), unless the authorization is terminated or revoked sooner. Performed at [facility], [place] [address], [place], [place] [address]   MRSA PCR Screening     Status: None   Collection Time: 01/20/19 10:44 AM   Specimen: Nasal Mucosa; Nasopharyngeal  Result Value Ref Range Status   MRSA by PCR NEGATIVE NEGATIVE Final    Comment:        The GeneXpert MRSA Assay (FDA approved for NASAL specimens only), is one component of a comprehensive MRSA colonization surveillance program. It is not intended to diagnose MRSA infection nor to guide or monitor treatment for MRSA infections. Performed at [facility] [place] [address] [place],  [address]          Radiology Studies: Ir Venipuncture 34yrs/older By Md  Result Date: 01/20/2019 INDICATION: 53 year old female type I diabetic with extensive medical problems currently in the emergency room with severe abdominal pain and nausea. She has a history  of extremely poor venous access. Over-the 3 hours she was in the emergency room, the only venous access the could be established was a left lower extremity intraosseous access. IV team was unsuccessful at obtaining peripheral or ultrasound-guided IV access. Current emergency room staff are unable to place a central line. Critical care medicine was also consulted and has had trouble with establishing central venous access on this challenging patient in the past. Therefore, interventional radiology was consulted for emergent bedside venous access. EXAM: VENIPUNCTURE BY  MD; IR ULTRASOUND GUIDANCE VASC ACCESS RIGHT MEDICATIONS: None ANESTHESIA/SEDATION: None FLUOROSCOPY TIME:  None COMPLICATIONS: None immediate. PROCEDURE: The right brachial vein was interrogated with ultrasound and found to be widely patent. An image was obtained and stored for the medical record. The right upper extremity was sterilely prepped with chlorhexidine. Under real-time sonographic guidance, the vessel was punctured with a 21 gauge micropuncture needle. Using standard technique, the initial micro needle was exchanged over a 0.018 micro wire for a transitional 4 [place] micro sheath. A valved cap was attached to the sheath which was then aspirated and flushed. The venous access works well. The access was secured to the arm with a sterile dressing. IMPRESSION: Ultrasound-guided venous access of the right upper extremity brachial vein. Electronically Signed   By: Jacqulynn Cadet M.D.   On: 01/20/2019 10:58   Ir US Guide Vasc Access Right  Result Date: 01/20/2019 INDICATION: 54 year old female type I diabetic with extensive medical problems currently in the emergency room with severe abdominal pain and nausea. She has a history of extremely poor venous access. Over-the 3 hours she was in the emergency room, the only venous access the could be established was a left lower extremity intraosseous access. IV team was unsuccessful at obtaining  peripheral or ultrasound-guided IV access. Current emergency room staff are unable to place a central line. Critical care medicine was also consulted and has had trouble with establishing central venous access on this challenging patient in the past. Therefore, interventional radiology was consulted for emergent bedside venous access. EXAM: VENIPUNCTURE BY MD; IR ULTRASOUND GUIDANCE VASC ACCESS RIGHT MEDICATIONS: None ANESTHESIA/SEDATION: None FLUOROSCOPY TIME:  None COMPLICATIONS: None immediate. PROCEDURE: The right brachial vein was interrogated with ultrasound and found to be widely patent. An image was obtained and stored for the medical record. The right upper extremity was sterilely prepped with chlorhexidine. Under real-time sonographic guidance, the vessel was punctured with a 21 gauge micropuncture needle. Using standard technique, the initial micro needle was exchanged over a 0.018 micro wire for a transitional 4 [place] micro sheath. A valved cap was attached to the sheath which was then aspirated and flushed. The venous access works well. The access was secured to the arm with a sterile dressing. IMPRESSION: Ultrasound-guided venous access of the right upper extremity brachial vein. Electronically Signed   By: Jacqulynn Cadet M.D.   On: 01/20/2019 10:58   Ir Picc Placement Right >5 Yrs Inc Img Guide  Result Date: 01/20/2019 INDICATION: DIABETIC KETOACIDOSIS, LIMITED PERIPHERAL ACCESS EXAM: FLUOROSCOPIC GUIDED PICC LINE INSERTION MEDICATIONS: 1% LIDOCAINE LOCAL CONTRAST:  None FLUOROSCOPY TIME:  18.0 seconds (1.9 mGy) COMPLICATIONS: None immediate. TECHNIQUE: The procedure, risks, benefits, and alternatives were explained to the patient and informed written consent was obtained. A timeout was performed prior to the initiation of the procedure. Under sterile conditions and local anesthesia, the existing right upper arm brachial midline access was removed over a 018 guidewire. Guidewire position  centrally at the SVC RA junction. Measurements obtained for the appropriate length. A peel-away sheath was placed and a 31 cm, 6 [place], triple lumen was inserted to level of the superior caval-atrial junction. A post procedure spot fluoroscopic was obtained. The catheter easily aspirated and flushed and was secured in place. A dressing was placed. The patient tolerated the procedure well without immediate post procedural complication. FINDINGS: After catheter placement, the tip lies within the superior cavoatrial junction. The catheter aspirates and flushes normally and is ready for immediate use. IMPRESSION: Successful fluoroscopic guided placement  of a right brachial vein approach, 31 cm, 6 French, triple lumen PICC with tip at the superior caval-atrial junction. The PICC line is ready for immediate use. Electronically Signed   By: Jerilynn Mages.  Shick M.D.   On: 01/20/2019 16:30   [place] Ekg Site Rite  Result Date: 01/20/2019 If Site Rite image not attached, placement could not be confirmed due to current cardiac rhythm.       Scheduled Meds:  busPIRone  10 mg Oral TID   Chlorhexidine Gluconate Cloth  6 each Topical Daily   enoxaparin (LOVENOX) injection  40 mg Subcutaneous Q24H   insulin aspart  0-15 Units Subcutaneous TID WC   insulin aspart  0-5 Units Subcutaneous QHS   insulin glargine  10 Units Subcutaneous QHS   metoprolol tartrate  12.5 mg Oral BID   pantoprazole  40 mg Oral Daily   pregabalin  150 mg Oral BID   pyridostigmine  60 mg Oral TID   Continuous Infusions:  sodium chloride 100 mL/hr at 01/21/19 0827   cefTRIAXone (ROCEPHIN)  IV Stopped (01/21/19 0820)     LOS: 1 day    Time spent: 38 minutes.     Hosie Poisson, MD [facility] Pager [phone]   If 7PM-7AM, please contact night-coverage [email or web address] Password [facility] 01/21/2019, 8:51 AM

## 2019-01-21 NOTE — Progress Notes (Signed)
After rechecking cbg after juice, glucose 56. 1/2 amp d50 given. CBG 132. Charted 1/2 amp d50 in MAR.

## 2019-01-22 DIAGNOSIS — E1040 Type 1 diabetes mellitus with diabetic neuropathy, unspecified: Secondary | ICD-10-CM

## 2019-01-22 DIAGNOSIS — F419 Anxiety disorder, unspecified: Secondary | ICD-10-CM

## 2019-01-22 DIAGNOSIS — F329 Major depressive disorder, single episode, unspecified: Secondary | ICD-10-CM

## 2019-01-22 LAB — BASIC METABOLIC PANEL
Anion gap: 5 (ref 5–15)
BUN: 9 mg/dL (ref 6–20)
CO2: 20 mmol/L — ABNORMAL LOW (ref 22–32)
Calcium: 7.3 mg/dL — ABNORMAL LOW (ref 8.9–10.3)
Chloride: 116 mmol/L — ABNORMAL HIGH (ref 98–111)
Creatinine, Ser: 1.05 mg/dL — ABNORMAL HIGH (ref 0.44–1.00)
GFR calc Af Amer: >60 mL/min (ref >60)
GFR calc non Af Amer: >60 mL/min (ref >60)
Glucose, Bld: 64 mg/dL — ABNORMAL LOW (ref 70–99)
Potassium: 3 mmol/L — ABNORMAL LOW (ref 3.5–5.1)
Sodium: 141 mmol/L (ref 135–145)

## 2019-01-22 LAB — PHOSPHORUS: Phosphorus: 2.9 mg/dL (ref 2.5–4.6)

## 2019-01-22 LAB — GLUCOSE, CAPILLARY
Glucose-Capillary: 25 mg/dL — CL (ref 70–99)
Glucose-Capillary: 88 mg/dL (ref 70–99)
Glucose-Capillary: 155 mg/dL — ABNORMAL HIGH (ref 70–99)
Glucose-Capillary: 166 mg/dL — ABNORMAL HIGH (ref 70–99)
Glucose-Capillary: 89 mg/dL (ref 70–99)

## 2019-01-22 LAB — CBC
HCT: 29.3 % — ABNORMAL LOW (ref 36.0–46.0)
Hemoglobin: 8.9 g/dL — ABNORMAL LOW (ref 12.0–15.0)
MCH: 26.5 pg (ref 26.0–34.0)
MCHC: 30.4 g/dL (ref 30.0–36.0)
MCV: 87.2 fL (ref 80.0–100.0)
Platelets: 333 10*3/uL (ref 150–400)
RBC: 3.36 MIL/uL — ABNORMAL LOW (ref 3.87–5.11)
RDW: 15 % (ref 11.5–15.5)
WBC: 5.1 10*3/uL (ref 4.0–10.5)
nRBC: 0 % (ref 0.0–0.2)

## 2019-01-22 LAB — HEMOGLOBIN AND HEMATOCRIT, BLOOD
HCT: 33.4 % — ABNORMAL LOW (ref 36.0–46.0)
Hemoglobin: 9.9 g/dL — ABNORMAL LOW (ref 12.0–15.0)

## 2019-01-22 LAB — MAGNESIUM: Magnesium: 1.7 mg/dL (ref 1.7–2.4)

## 2019-01-22 MED ORDER — CIPROFLOXACIN HCL 500 MG PO TABS: 500.0000 mg | ORAL_TABLET | Freq: Two times a day (BID) | ORAL | 0 refills | Status: DC

## 2019-01-22 MED ORDER — POTASSIUM CHLORIDE CRYS ER 20 MEQ PO TBCR: 40.0000 meq | EXTENDED_RELEASE_TABLET | Freq: Once | ORAL | 0 refills | Status: DC

## 2019-01-22 MED ORDER — DEXTROSE 50 % IV SOLN
INTRAVENOUS | Status: AC
  Administered 2019-01-22: 25 mL
  Filled 2019-01-22: qty 50

## 2019-01-22 MED ORDER — INSULIN GLARGINE 100 UNIT/ML ~~LOC~~ SOLN
15.0000 [IU] | SUBCUTANEOUS | Status: DC
  Administered 2019-01-23: 15 [IU] via SUBCUTANEOUS
  Filled 2019-01-22 (×2): qty 0.15

## 2019-01-22 MED ORDER — POTASSIUM CHLORIDE CRYS ER 20 MEQ PO TBCR
40.0000 meq | EXTENDED_RELEASE_TABLET | Freq: Two times a day (BID) | ORAL | Status: AC
  Administered 2019-01-22 (×2): 40 meq via ORAL
  Filled 2019-01-22 (×2): qty 2

## 2019-01-22 MED ORDER — FLUCONAZOLE 100 MG PO TABS: 200.0000 mg | ORAL_TABLET | Freq: Every day | ORAL | 0 refills | Status: AC

## 2019-01-22 MED ORDER — PROMETHAZINE HCL 12.5 MG PO TABS: 12.5000 mg | ORAL_TABLET | Freq: Three times a day (TID) | ORAL | 0 refills | Status: DC | PRN

## 2019-01-22 MED ORDER — INSULIN GLARGINE 100 UNIT/ML ~~LOC~~ SOLN: 10.0000 [IU] | Freq: Every day | SUBCUTANEOUS | Status: DC

## 2019-01-22 MED ORDER — SODIUM CHLORIDE 0.9% FLUSH
10.0000 mL | INTRAVENOUS | Status: DC | PRN
  Administered 2019-01-22: 10 mL
  Filled 2019-01-22: qty 40

## 2019-01-22 NOTE — Evaluation (Signed)
Physical Therapy Evaluation Patient Details Name: [name] MRN: [id number] DOB: 1966/03/11 Today's Date: 01/22/2019   History of Present Illness  53 year old female with history of diabetes mellitus type 1, tardive dyskinesia secondary to Reglan, CAD status post CABG, depression, severe gastroparesis was recent hospitalizations for DKA presented with the same.     Clinical Impression  [name] is a 53 y.o. female with above diagnosis. She reports at baseline she is independent with all mobility in her home and community. 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 She currently requires min guard assist for transfers and gait and is unsteady with no UE during ambulation. Pt's balance is improved with IV pole for support and she had decreased sway/stagger during gait. Educated patient on benefits of using SPC for balance while ambulating when she returns home and on benefits of mobilizing throughout her stay here. Encouraged to mobilize 3x/day with RN staff to progress activity tolerance. Acute PT will follow to progress mobility as able and reduce reliance on external support for mobility.    Follow Up Recommendations No PT follow up    Equipment Recommendations  None recommended by PT    Recommendations for Other Services       Precautions / Restrictions Precautions Precautions: Fall Restrictions Weight Bearing Restrictions: No      Mobility  Bed Mobility         Transfers Overall transfer level: Needs assistance Equipment used: None Transfers: Sit to/from Stand Sit to Stand: Min guard Stand pivot transfers: Min guard       General transfer comment: min guard for safety, pt unsteady at first but able to recover balance independently  Ambulation/Gait Ambulation/Gait assistance: Min guard Gait Distance (Feet): 100 Feet Assistive device: IV Pole Gait Pattern/deviations: Step-through pattern;Decreased stride length Gait velocity: slow and  labored   General Gait Details: unsatedy with no device, pt's balance improved with use of IV pole  Stairs            Wheelchair Mobility    Modified Rankin (Stroke Patients Only)       Balance Overall balance assessment: Mild deficits observed, not formally tested   Sitting balance-Leahy Scale: Good       Standing balance-Leahy Scale: Good              Pertinent Vitals/Pain Pain Assessment: No/denies pain    Home Living Family/patient expects to be discharged to:: Private residence Living Arrangements: Other relatives(pt's younger sister they keep to themselves) Available Help at Discharge: Family;Available PRN/intermittently Type of Home: Apartment Home Access: Stairs to enter Entrance Stairs-Rails: Right;Left Entrance Stairs-Number of Steps: 1 flight, approx 12 Home Layout: One level Home Equipment: Grab bars - tub/shower;Cane - single point Additional Comments: pt lives with her sister but states they each keep to themselves and her sister isn't very helpful to her    Prior Function Level of Independence: Independent               Hand Dominance   Dominant Hand: Right    Extremity/Trunk Assessment   Upper Extremity Assessment Upper Extremity Assessment: Defer to OT evaluation    Lower Extremity Assessment Lower Extremity Assessment: Overall WFL for tasks assessed    Cervical / Trunk Assessment Cervical / Trunk Assessment: Normal  Communication   Communication: No difficulties  Cognition Arousal/Alertness: Awake/alert Behavior During Therapy: WFL for tasks assessed/performed Overall Cognitive Status: Within Functional Limits for tasks assessed  Assessment/Plan    PT Assessment Patient needs continued PT services  PT Problem List Decreased mobility;Decreased activity tolerance;Decreased balance       PT Treatment Interventions Gait training    PT Goals (Current goals can be found in the Care Plan  section)  Acute Rehab PT Goals Patient Stated Goal: none specified by patient, she wants to get back home PT Goal Formulation: With patient Time For Goal Achievement: 01/29/19 Potential to Achieve Goals: Good    Frequency Min 3X/week    AM-PAC PT "6 Clicks" Mobility  Outcome Measure Help needed turning from your back to your side while in a flat bed without using bedrails?: A Little Help needed moving from lying on your back to sitting on the side of a flat bed without using bedrails?: A Little Help needed moving to and from a bed to a chair (including a wheelchair)?: A Little Help needed standing up from a chair using your arms (e.g., wheelchair or bedside chair)?: A Little Help needed to walk in hospital room?: A Little Help needed climbing 3-5 steps with a railing? : A Little 6 Click Score: 18    End of Session Equipment Utilized During Treatment: Gait belt Activity Tolerance: Patient tolerated treatment well Patient left: in chair;with call bell/phone within reach Nurse Communication: Mobility status PT Visit Diagnosis: Other abnormalities of gait and mobility (R26.89)    Time: 1320-1350 PT Time Calculation (min) (ACUTE ONLY): 30 min   Charges:   PT Evaluation $PT Eval Low Complexity: 1 Low PT Treatments $Gait Training: 8-22 mins        Kipp Brood, PT, DPT, [facility] Physical Therapist with [facility]  01/22/2019 2:19 PM

## 2019-01-22 NOTE — Plan of Care (Signed)
  Problem: Acute Rehab PT Goals(only PT should resolve) Goal: Patient Will Transfer Sit To/From Stand Outcome: Progressing Flowsheets (Taken 01/22/2019 1419) Patient will transfer sit to/from stand: with modified independence Goal: Pt Will Transfer Bed To Chair/Chair To Bed Outcome: Progressing Flowsheets (Taken 01/22/2019 1419) Pt will Transfer Bed to Chair/Chair to Bed: with modified independence Goal: Pt Will Ambulate Outcome: Progressing Flowsheets (Taken 01/22/2019 1419) Pt will Ambulate:  > 125 feet  with supervision  with least restrictive assistive device Goal: Pt Will Go Up/Down Stairs Outcome: Progressing Flowsheets (Taken 01/22/2019 1419) Pt will Go Up / Down Stairs:  Flight  with supervision  with rail(s)

## 2019-01-22 NOTE — Discharge Summary (Signed)
Physician Discharge Summary  [name] WLS:[id number] DOB: 1965-12-06 DOA: 01/20/2019  PCP: [name], MD  Admit date: 01/20/2019 Discharge date: 01/22/2019  Admitted From: Home.  Disposition: Home  Recommendations for Outpatient Follow-up:  1. Follow up with PCP in 1-2 weeks 2. Please obtain BMP/CBC in one week    Discharge Condition stable.  CODE STATUS:full code.  Diet recommendation: Heart Healthy / Carb Modified   Brief/Interim Summary: [name] a 53 y.o.femalewith medical history significant oftype 1 diabetes mellitus on insulin, tardive dyskinesia secondary to Reglan, coronary artery disease status post CABG, depression and severe gastroparesis on Mestinon was brought to the emergency room with 1 day of severe persistent abdominal pain and nausea. In the emergency room patient was persistently nauseous vomiting. She was lethargic. Bicarbonate was 14, creatinine was 1.24, blood sugars were 366, anion gap was 17 and lactic acid was 3. She was aggressively hydrated after multiple attempts to get IV lines, 10 units of insulin bolus was given, multiple nausea medications were given and started on insulin drip because of persistent symptoms and unable to take p.o. Urinalysis shows grossly abnormal urine and was given a dose of Rocephin with cultures.   Discharge Diagnoses:  Principal Problem:   DKA (diabetic ketoacidoses) ([place]) Active Problems:   Diabetic neuropathy, type I diabetes mellitus ([place])   Anxiety and depression   Diabetic gastroparesis (HCC)   AKI (acute kidney injury) ([place])   HLD (hyperlipidemia)   GERD (gastroesophageal reflux disease)   Essential hypertension   Tardive dyskinesia  DKA: Resolved. Marland Kitchen Her CBG'S are better, AG is closed.    CBG (last 3)  Recent Labs (last 2 labs)        Recent Labs    01/22/19 0802 01/22/19 1134 01/22/19 1600  GLUCAP 88 155* 166*    Resume home insulin on discharge.    Gram negative rod UTI;   Continue with rocephin for now along with gentle hydration.  Cultures show enterobacter and citrobacter. Discharged her on oral ciprofloxacin to complete the course.    Tardive dyskinesia:  Hold the zofran for now.   Hypokalemia:  Replaced. Repeat BMP in am.   GERD;  Continue with protonix.    AKI:  Probably from pre renal azotemia, resume IV hydration and repeat renal parameters in am show improvement.     Hypophosphatemia:  Replaced    Bleeding after taking the picc line out.  H&H ordered and its stable around 9.  Continue to monitor the site for swelling.      Discharge Instructions  Discharge Instructions    Diet - low sodium heart healthy   Complete by: As directed    Discharge instructions   Complete by: As directed    Please follow up with PCP in one week.     Allergies as of 01/22/2019      Reactions   Anesthetics, Amide Nausea And Vomiting   Penicillins Diarrhea, Nausea And Vomiting, Other (See Comments)   Has patient had a PCN reaction causing immediate rash, facial/tongue/throat swelling, SOB or lightheadedness with hypotension: Yes Has patient had a PCN reaction causing severe rash involving mucus membranes or skin necrosis: No Has patient had a PCN reaction that required hospitalization No Has patient had a PCN reaction occurring within the last 10 years: Yes  If all of the above answers are "NO", then may proceed with Cephalosporin use.   Buprenorphine Hcl Rash   Encainide Nausea And Vomiting   Metoclopramide Other (See Comments)   Dystonia,  muscle rigidity.  Patient states she is only allergic to the "pill form, not the IV"      Medication List    STOP taking these medications   diphenhydrAMINE 25 mg capsule Commonly known as: BENADRYL   ondansetron 4 MG tablet Commonly known as: Zofran     TAKE these medications   Accu-Chek Aviva Plus test strip Generic drug: glucose blood Use as instructed to test blood sugar 4 times  daily Dx E10.65   ammonium lactate 12 % cream Commonly known as: AMLACTIN Apply topically as needed for dry skin.   aspirin EC 81 MG tablet Take 1 tablet (81 mg total) by mouth daily.   busPIRone 10 MG tablet Commonly known as: BUSPAR Take 1 tablet (10 mg total) by mouth 3 (three) times daily.   ciprofloxacin 500 MG tablet Commonly known as: Cipro Take 1 tablet (500 mg total) by mouth 2 (two) times daily. Start taking on: January 23, 2019   docusate sodium 100 MG capsule Commonly known as: Colace Take 1 capsule (100 mg total) by mouth daily as needed for mild constipation.   fluconazole 100 MG tablet Commonly known as: Diflucan Take 2 tablets (200 mg total) by mouth daily for 1 day.   insulin aspart 100 UNIT/ML injection Commonly known as: novoLOG Inject 0-12 Units into the skin 3 (three) times daily before meals. As per sliding scale   Insulin Glargine 100 UNIT/ML Solostar Pen Commonly known as: Lantus SoloStar Inject 15 units under the skin once daily in the morning.   Insulin Syringe-Needle U-100 31G X 5/16" 0.5 ML Misc Commonly known as: BD Insulin Syringe Ultrafine 1 each by Does not apply route 4 (four) times daily.   loperamide 2 MG capsule Commonly known as: IMODIUM Take 1 capsule (2 mg total) by mouth 3 (three) times daily as needed for diarrhea or loose stools.   metoprolol tartrate 25 MG tablet Commonly known as: LOPRESSOR Take 0.5 tablets (12.5 mg total) by mouth 2 (two) times daily.   OneTouch Delica Plus [id number] Misc 1 Units by Does not apply route as directed.   OneTouch Verio w/Device Kit Use as directed   pantoprazole 40 MG tablet Commonly known as: PROTONIX Take 1 tablet (40 mg total) by mouth daily.   potassium chloride SA 20 MEQ tablet Commonly known as: K-DUR Take 2 tablets (40 mEq total) by mouth once for 1 dose.   pregabalin 150 MG capsule Commonly known as: Lyrica Take 1 capsule (150 mg total) by mouth 2 (two) times daily.    promethazine 12.5 MG tablet Commonly known as: PHENERGAN Take 1 tablet (12.5 mg total) by mouth every 8 (eight) hours as needed for up to 20 doses for nausea or vomiting.   pyridostigmine 60 MG tablet Commonly known as: MESTINON Take 60 mg by mouth 3 (three) times daily.       Allergies  Allergen Reactions  . Anesthetics, Amide Nausea And Vomiting  . Penicillins Diarrhea, Nausea And Vomiting and Other (See Comments)    Has patient had a PCN reaction causing immediate rash, facial/tongue/throat swelling, SOB or lightheadedness with hypotension: Yes Has patient had a PCN reaction causing severe rash involving mucus membranes or skin necrosis: No Has patient had a PCN reaction that required hospitalization No Has patient had a PCN reaction occurring within the last 10 years: Yes  If all of the above answers are "NO", then may proceed with Cephalosporin use.   . Buprenorphine Hcl Rash  . Encainide Nausea And  Vomiting  . Metoclopramide Other (See Comments)    Dystonia, muscle rigidity.  Patient states she is only allergic to the "pill form, not the IV"    Consultations:  None.    Procedures/Studies: Ir Venipuncture 78yr/older By Md  Result Date: 01/20/2019 INDICATION: 53year old female type I diabetic with extensive medical problems currently in the emergency room with severe abdominal pain and nausea. She has a history of extremely poor venous access. Over-the 3 hours she was in the emergency room, the only venous access the could be established was a left lower extremity intraosseous access. IV team was unsuccessful at obtaining peripheral or ultrasound-guided IV access. Current emergency room staff are unable to place a central line. Critical care medicine was also consulted and has had trouble with establishing central venous access on this challenging patient in the past. Therefore, interventional radiology was consulted for emergent bedside venous access. EXAM: VENIPUNCTURE BY  MD; IR ULTRASOUND GUIDANCE VASC ACCESS RIGHT MEDICATIONS: None ANESTHESIA/SEDATION: None FLUOROSCOPY TIME:  None COMPLICATIONS: None immediate. PROCEDURE: The right brachial vein was interrogated with ultrasound and found to be widely patent. An image was obtained and stored for the medical record. The right upper extremity was sterilely prepped with chlorhexidine. Under real-time sonographic guidance, the vessel was punctured with a 21 gauge micropuncture needle. Using standard technique, the initial micro needle was exchanged over a 0.018 micro wire for a transitional 4 [place] sheath. A valved cap was attached to the sheath which was then aspirated and flushed. The venous access works well. The access was secured to the arm with a sterile dressing. IMPRESSION: Ultrasound-guided venous access of the right upper extremity brachial vein. Electronically Signed   By: HJacqulynn CadetM.D.   On: 01/20/2019 10:58   Ir [place] Vasc Access Right  Result Date: 01/20/2019 INDICATION: 53year old female type I diabetic with extensive medical problems currently in the emergency room with severe abdominal pain and nausea. She has a history of extremely poor venous access. Over-the 3 hours she was in the emergency room, the only venous access the could be established was a left lower extremity intraosseous access. IV team was unsuccessful at obtaining peripheral or ultrasound-guided IV access. Current emergency room staff are unable to place a central line. Critical care medicine was also consulted and has had trouble with establishing central venous access on this challenging patient in the past. Therefore, interventional radiology was consulted for emergent bedside venous access. EXAM: VENIPUNCTURE BY MD; IR ULTRASOUND GUIDANCE VASC ACCESS RIGHT MEDICATIONS: None ANESTHESIA/SEDATION: None FLUOROSCOPY TIME:  None COMPLICATIONS: None immediate. PROCEDURE: The right brachial vein was interrogated with ultrasound and  found to be widely patent. An image was obtained and stored for the medical record. The right upper extremity was sterilely prepped with chlorhexidine. Under real-time sonographic guidance, the vessel was punctured with a 21 gauge micropuncture needle. Using standard technique, the initial micro needle was exchanged over a 0.018 micro wire for a transitional 4 [place] sheath. A valved cap was attached to the sheath which was then aspirated and flushed. The venous access works well. The access was secured to the arm with a sterile dressing. IMPRESSION: Ultrasound-guided venous access of the right upper extremity brachial vein. Electronically Signed   By: HJacqulynn CadetM.D.   On: 01/20/2019 10:58   Ir Picc Placement Right >5 Yrs Inc Img Guide  Result Date: 01/20/2019 INDICATION: DIABETIC KETOACIDOSIS, LIMITED PERIPHERAL ACCESS EXAM: FLUOROSCOPIC GUIDED PICC LINE INSERTION MEDICATIONS: 1% LIDOCAINE LOCAL CONTRAST:  None  FLUOROSCOPY TIME:  18.0 seconds (1.9 mGy) COMPLICATIONS: None immediate. TECHNIQUE: The procedure, risks, benefits, and alternatives were explained to the patient and informed written consent was obtained. A timeout was performed prior to the initiation of the procedure. Under sterile conditions and local anesthesia, the existing right upper arm brachial midline access was removed over a 018 guidewire. Guidewire position centrally at the SVC RA junction. Measurements obtained for the appropriate length. A peel-away sheath was placed and a 31 cm, 6 [place], triple lumen was inserted to level of the superior caval-atrial junction. A post procedure spot fluoroscopic was obtained. The catheter easily aspirated and flushed and was secured in place. A dressing was placed. The patient tolerated the procedure well without immediate post procedural complication. FINDINGS: After catheter placement, the tip lies within the superior cavoatrial junction. The catheter aspirates and flushes normally and is  ready for immediate use. IMPRESSION: Successful fluoroscopic guided placement of a right brachial vein approach, 31 cm, 6 French, triple lumen PICC with tip at the superior caval-atrial junction. The PICC line is ready for immediate use. Electronically Signed   By: Jerilynn Mages.  Shick M.D.   On: 01/20/2019 16:30   [place] Ekg Site Rite  Result Date: 01/20/2019 If Site Rite image not attached, placement could not be confirmed due to current cardiac rhythm.    Subjective: No new complaints.   Discharge Exam: Vitals:   01/22/19 0523 01/22/19 1325  BP: (!) 108/53 122/61  Pulse: 68 65  Resp: 19 15  Temp: (!) 97.5 F (36.4 C) 98.5 F (36.9 C)  SpO2: 99% 100%   Vitals:   01/21/19 1743 01/21/19 2012 01/22/19 0523 01/22/19 1325  BP: 137/64 (!) 109/52 (!) 108/53 122/61  Pulse: 74 70 68 65  Resp: _0 Temp: 98.7 F (37.1 C) 98 F (36.7 C) (!) 97.5 F (36.4 C) 98.5 F (36.9 C)  TempSrc: Oral Oral Oral Oral  SpO2: 100% 98% 99% 100%  Weight:      Height:        General: Pt is alert, awake, not in acute distress Cardiovascular: RRR, S1/S2 +, no rubs, no gallops Respiratory: CTA bilaterally, no wheezing, no rhonchi Abdominal: Soft, NT, ND, bowel sounds + Extremities: no edema, no cyanosis    The results of significant diagnostics from this hospitalization (including imaging, microbiology, ancillary and laboratory) are listed below for reference.     Microbiology: Recent Results (from the past 240 hour(s))  Urine culture     Status: Abnormal (Preliminary result)   Collection Time: 01/20/19  7:31 AM   Specimen: Urine, Random  Result Value Ref Range Status   Specimen Description   Final    URINE, RANDOM Performed at [facility], [place] [address], [place], [place] [address]    Special Requests   Final    NONE Performed at [facility], [place] [address] [place], [place] [address]    Culture (A)  Final    >=100,000 COLONIES/mL GRAM NEGATIVE RODS 80,000  COLONIES/mL ENTEROBACTER AEROGENES IDENTIFICATION AND SUSCEPTIBILITIES TO FOLLOW Performed at [facility], [place] [address] [place],  [address]    Report Status PENDING  Incomplete  SARS Coronavirus 2 (CEPHEID - Performed in [place] hospital lab), Hosp Order     Status: None   Collection Time: 01/20/19  8:50 AM   Specimen: Nasopharyngeal Swab  Result Value Ref Range Status   SARS Coronavirus 2 NEGATIVE NEGATIVE Final    Comment: (NOTE) If  result is NEGATIVE SARS-CoV-2 target nucleic acids are NOT DETECTED. The SARS-CoV-2 RNA is generally detectable in upper and lower  respiratory specimens during the acute phase of infection. The lowest  concentration of SARS-CoV-2 viral copies this assay can detect is 250  copies / mL. A negative result does not preclude SARS-CoV-2 infection  and should not be used as the sole basis for treatment or other  patient management decisions.  A negative result may occur with  improper specimen collection / handling, submission of specimen other  than nasopharyngeal swab, presence of viral mutation(s) within the  areas targeted by this assay, and inadequate number of viral copies  (<250 copies / mL). A negative result must be combined with clinical  observations, patient history, and epidemiological information. If result is POSITIVE SARS-CoV-2 target nucleic acids are DETECTED. The SARS-CoV-2 RNA is generally detectable in upper and lower  respiratory specimens dur ing the acute phase of infection.  Positive  results are indicative of active infection with SARS-CoV-2.  Clinical  correlation with patient history and other diagnostic information is  necessary to determine patient infection status.  Positive results do  not rule out bacterial infection or co-infection with other viruses. If result is PRESUMPTIVE POSTIVE SARS-CoV-2 nucleic acids MAY BE PRESENT.   A presumptive positive result was obtained on the submitted specimen  and  confirmed on repeat testing.  While 2019 novel coronavirus  (SARS-CoV-2) nucleic acids may be present in the submitted sample  additional confirmatory testing may be necessary for epidemiological  and / or clinical management purposes  to differentiate between  SARS-CoV-2 and other Sarbecovirus currently known to infect humans.  If clinically indicated additional testing with an alternate test  methodology [phone]) is advised. The SARS-CoV-2 RNA is generally  detectable in upper and lower respiratory sp ecimens during the acute  phase of infection. The expected result is Negative. Fact Sheet for Patients:  StrictlyIdeas.no Fact Sheet for Healthcare Providers: BankingDealers.co.za This test is not yet approved or cleared by the [place] FDA and has been authorized for detection and/or diagnosis of SARS-CoV-2 by FDA under an Emergency Use Authorization (EUA).  This EUA will remain in effect (meaning this test can be used) for the duration of the COVID-19 declaration under Section 564(b)(1) of the Act, 21 U.S.C. section 360bbb-3(b)(1), unless the authorization is terminated or revoked sooner. Performed at [facility], [place] [address], [place], [place] [address]   MRSA PCR Screening     Status: None   Collection Time: 01/20/19 10:44 AM   Specimen: Nasal Mucosa; Nasopharyngeal  Result Value Ref Range Status   MRSA by PCR NEGATIVE NEGATIVE Final    Comment:        The GeneXpert MRSA Assay (FDA approved for NASAL specimens only), is one component of a comprehensive MRSA colonization surveillance program. It is not intended to diagnose MRSA infection nor to guide or monitor treatment for MRSA infections. Performed at [facility], [place] [address], [place], [place] [address]      Labs: BNP (last 3 results) No results for input(s): BNP in the last 8760 hours. Basic Metabolic Panel: Recent  Labs  Lab 01/20/19 1106 01/20/19 1726 01/20/19 2122 01/21/19 0113 01/21/19 0900 01/21/19 2055 01/22/19 0530  NA 146* 144 145 143 144 140 141  K 4.6 3.3* 3.6 3.1* 2.9* 3.2* 3.0*  CL 119* 116* 119* 119* 118* 115* 116*  CO2 11* 16* 18* 20* 18* 20* 20*  GLUCOSE 228* 147* 199*  296* 108* 93 64*  BUN _0 CREATININE 1.03* 1.04* 1.09* 1.11* 1.09* 1.02* 1.05*  CALCIUM 8.5* 8.5* 8.1* 7.9* 7.5* 7.6* 7.3*  MG 1.9 2.0  --  2.1  --   --  1.7  PHOS 2.0* 1.9*  --  1.9*  --   --  2.9   Liver Function Tests: Recent Labs  Lab 01/20/19 0731  AST 18  ALT 13  ALKPHOS 87  BILITOT 1.1  PROT 8.1  ALBUMIN 4.0   Recent Labs  Lab 01/20/19 0731  LIPASE 24   No results for input(s): AMMONIA in the last 168 hours. CBC: Recent Labs  Lab 01/20/19 0731 01/20/19 0738 01/21/19 0900 01/22/19 0530  WBC 9.9  --  9.1 5.1  NEUTROABS 9.1*  --  6.8  --   HGB 10.1* 11.2* 8.8* 8.9*  HCT 33.5* 33.0* 29.1* 29.3*  MCV 87.5  --  88.2 87.2  PLT 399  --  367 333   Cardiac Enzymes: No results for input(s): CKTOTAL, CKMB, CKMBINDEX, TROPONINI in the last 168 hours. BNP: Invalid input(s): POCBNP CBG: Recent Labs  Lab 01/21/19 1629 01/21/19 2014 01/22/19 0731 01/22/19 0802 01/22/19 1134  GLUCAP 181* 89 25* 88 155*   D-Dimer No results for input(s): DDIMER in the last 72 hours. Hgb A1c No results for input(s): HGBA1C in the last 72 hours. Lipid Profile No results for input(s): CHOL, HDL, LDLCALC, TRIG, CHOLHDL, LDLDIRECT in the last 72 hours. Thyroid function studies No results for input(s): TSH, T4TOTAL, T3FREE, THYROIDAB in the last 72 hours.  Invalid input(s): FREET3 Anemia work up No results for input(s): VITAMINB12, FOLATE, FERRITIN, TIBC, IRON, RETICCTPCT in the last 72 hours. Urinalysis    Component Value Date/Time   COLORURINE YELLOW 01/20/2019 0607   APPEARANCEUR HAZY (A) 01/20/2019 0607   LABSPEC 1.021 01/20/2019 0607   PHURINE 6.0 01/20/2019 0607   GLUCOSEU  >=500 (A) 01/20/2019 0607   HGBUR SMALL (A) 01/20/2019 0607   BILIRUBINUR NEGATIVE 01/20/2019 0607   BILIRUBINUR negative 11/13/2018 1102   KETONESUR 80 (A) 01/20/2019 0607   PROTEINUR 100 (A) 01/20/2019 0607   UROBILINOGEN 0.2 11/13/2018 1102   UROBILINOGEN 0.2 04/27/2015 1608   NITRITE POSITIVE (A) 01/20/2019 0607   LEUKOCYTESUR TRACE (A) 01/20/2019 0607   Sepsis Labs Invalid input(s): PROCALCITONIN,  WBC,  LACTICIDVEN Microbiology Recent Results (from the past 240 hour(s))  Urine culture     Status: Abnormal (Preliminary result)   Collection Time: 01/20/19  7:31 AM   Specimen: Urine, Random  Result Value Ref Range Status   Specimen Description   Final    URINE, RANDOM Performed at [facility], [place] [address] [place], [place] [address]    Special Requests   Final    NONE Performed at [facility], [place] [address] [place], [place] [address]    Culture (A)  Final    >=100,000 COLONIES/mL GRAM NEGATIVE RODS 80,000 COLONIES/mL ENTEROBACTER AEROGENES IDENTIFICATION AND SUSCEPTIBILITIES TO FOLLOW Performed at [facility], [place] [address], [place], [place] [address]    Report Status PENDING  Incomplete  SARS Coronavirus 2 (CEPHEID - Performed in [place] hospital lab), Hosp Order     Status: None   Collection Time: 01/20/19  8:50 AM   Specimen: Nasopharyngeal Swab  Result Value Ref Range Status   SARS Coronavirus 2 NEGATIVE NEGATIVE Final    Comment: (NOTE) If result is NEGATIVE SARS-CoV-2 target nucleic acids are NOT  DETECTED. The SARS-CoV-2 RNA is generally detectable in upper and lower  respiratory specimens during the acute phase of infection. The lowest  concentration of SARS-CoV-2 viral copies this assay can detect is 250  copies / mL. A negative result does not preclude SARS-CoV-2 infection  and should not be used as the sole basis for treatment or other  patient management decisions.  A negative result may occur with  improper  specimen collection / handling, submission of specimen other  than nasopharyngeal swab, presence of viral mutation(s) within the  areas targeted by this assay, and inadequate number of viral copies  (<250 copies / mL). A negative result must be combined with clinical  observations, patient history, and epidemiological information. If result is POSITIVE SARS-CoV-2 target nucleic acids are DETECTED. The SARS-CoV-2 RNA is generally detectable in upper and lower  respiratory specimens dur ing the acute phase of infection.  Positive  results are indicative of active infection with SARS-CoV-2.  Clinical  correlation with patient history and other diagnostic information is  necessary to determine patient infection status.  Positive results do  not rule out bacterial infection or co-infection with other viruses. If result is PRESUMPTIVE POSTIVE SARS-CoV-2 nucleic acids MAY BE PRESENT.   A presumptive positive result was obtained on the submitted specimen  and confirmed on repeat testing.  While 2019 novel coronavirus  (SARS-CoV-2) nucleic acids may be present in the submitted sample  additional confirmatory testing may be necessary for epidemiological  and / or clinical management purposes  to differentiate between  SARS-CoV-2 and other Sarbecovirus currently known to infect humans.  If clinically indicated additional testing with an alternate test  methodology [phone]) is advised. The SARS-CoV-2 RNA is generally  detectable in upper and lower respiratory sp ecimens during the acute  phase of infection. The expected result is Negative. Fact Sheet for Patients:  StrictlyIdeas.no Fact Sheet for Healthcare Providers: BankingDealers.co.za This test is not yet approved or cleared by the [place] FDA and has been authorized for detection and/or diagnosis of SARS-CoV-2 by FDA under an Emergency Use Authorization (EUA).  This EUA will remain in  effect (meaning this test can be used) for the duration of the COVID-19 declaration under Section 564(b)(1) of the Act, 21 U.S.C. section 360bbb-3(b)(1), unless the authorization is terminated or revoked sooner. Performed at [facility], [place] [address] [place], [place] [address]   MRSA PCR Screening     Status: None   Collection Time: 01/20/19 10:44 AM   Specimen: Nasal Mucosa; Nasopharyngeal  Result Value Ref Range Status   MRSA by PCR NEGATIVE NEGATIVE Final    Comment:        The GeneXpert MRSA Assay (FDA approved for NASAL specimens only), is one component of a comprehensive MRSA colonization surveillance program. It is not intended to diagnose MRSA infection nor to guide or monitor treatment for MRSA infections. Performed at [facility], [place] [address] [place], [place] [address]      Time coordinating discharge: 32  minutes  SIGNED:   Hosie Poisson, MD  [facility] 01/22/2019, 3:19 PM Pager   If 7PM-7AM, please contact night-coverage [email or web address] Password TRH1

## 2019-01-22 NOTE — Progress Notes (Signed)
Hypoglycemic Event  CBG: 25 (0731)  Treatment: D50 25 mL (12.5 gm)  Symptoms: Sweaty  Follow-up CBG: Time:0802 CBG Result:88  Possible Reasons for Event: Unknown  Comments/MD notified:Pt asleep during bedside report.  Pt easily aroused when I entered the room after the NT informed me of the cbg of 25.  Pt alert and oriented x4. 54ml D50 given IV. Pt complaining of not getting enough food.  Does not want the full liquid tray. Wants eggs for breakfast.     Audrea Muscat

## 2019-01-22 NOTE — Progress Notes (Signed)
IV nurse removed PICC from the right arm. Following the procedure the pt was sitting in the chair eating dinner. Pt called the nurses desk and said she needed help. I went right in and saw a large amount of blood in the bed on the right side. I applied pressure thru the gown with linens that were in the chair and called for help. Sarah assisted to re-dress the site and clean up the patient.  The iv site was covered in several 4x4s and wrapped with an elastic dressing (ace). Vitals were charted.

## 2019-01-22 NOTE — Evaluation (Signed)
Occupational Therapy Evaluation Patient Details Name: [name] MRN: [id number] DOB: 12/06/1965 Today's Date: 01/22/2019    History of Present Illness 53 year old female with history of diabetes mellitus type 1, tardive dyskinesia secondary to Reglan, CAD status post CABG, depression, severe gastroparesis was recent hospitalizations for DKA presented with the same.    Clinical Impression   Pt was admitted for DKA; she has had a couple of recent admissions for the same condition. She reports that she is normally independent with adls. She currently needs min guard to min A.  Pt was guarded using RUE, in which she has a PICC line.  This was her first time OOB since this admission. Will follow in acute setting with mod I level goals    Follow Up Recommendations  (?HHOT depending on progress)    Equipment Recommendations  (likely none)    Recommendations for Other Services       Precautions / Restrictions Precautions Precautions: Fall Restrictions Weight Bearing Restrictions: No      Mobility Bed Mobility Overal bed mobility: Modified Independent             General bed mobility comments: extra time  Transfers Overall transfer level: Needs assistance Equipment used: None Transfers: Sit to/from [employer] Transfers Sit to Stand: Min guard Stand pivot transfers: Min assist       General transfer comment: for safety; steadying assistance walking with SPT and taking a few steps to chair    Balance Overall balance assessment: Mild deficits observed, not formally tested                                         ADL either performed or assessed with clinical judgement   ADL Overall ADL's : Needs assistance/impaired Eating/Feeding: Independent   Grooming: Oral care;Supervision/safety;Standing   Upper Body Bathing: Set up   Lower Body Bathing: Min guard   Upper Body Dressing : Set up   Lower Body Dressing: Min guard   Toilet  Transfer: Minimal assistance;Stand-pivot;BSC   Toileting- Clothing Manipulation and Hygiene: Min guard         General ADL Comments: Pt got up to use commode and performed grooming at sink. This was pt's first time OOB and she felt weak:  min guard to min A for safety/balance. No AD used     Vision         Perception     Praxis      Pertinent Vitals/Pain Pain Assessment: No/denies pain     Hand Dominance Right   Extremity/Trunk Assessment Upper Extremity Assessment Upper Extremity Assessment: (guards LUE, has PICC)           Communication Communication Communication: No difficulties   Cognition Arousal/Alertness: Awake/alert Behavior During Therapy: WFL for tasks assessed/performed Overall Cognitive Status: Within Functional Limits for tasks assessed                                     General Comments       Exercises     Shoulder Instructions      Home Living Family/patient expects to be discharged to:: Private residence Living Arrangements: Other relatives Available Help at Discharge: Family;Available PRN/intermittently Type of Home: Apartment             Bathroom Shower/Tub: Tub/shower unit;Curtain   Bathroom Toilet:  Standard     Home Equipment: Grab bars - tub/shower          Prior Functioning/Environment Level of Independence: Independent                 OT Problem List: Decreased strength;Decreased activity tolerance;Impaired balance (sitting and/or standing)      OT Treatment/Interventions: Self-care/ADL training;DME and/or AE instruction;Patient/family education;Balance training;Therapeutic activities    OT Goals(Current goals can be found in the care plan section) Acute Rehab OT Goals Patient Stated Goal: none stated; wanted to get OOB OT Goal Formulation: With patient Time For Goal Achievement: 02/05/19 Potential to Achieve Goals: Good ADL Goals Pt Will Transfer to Toilet: with modified  independence;regular height toilet;ambulating Additional ADL Goal #1: pt will gather clothes and perform adl at mod I level  OT Frequency: Min 2X/week   Barriers to D/C:            Co-evaluation              AM-PAC OT "6 Clicks" Daily Activity     Outcome Measure Help from another person eating meals?: None Help from another person taking care of personal grooming?: A Little Help from another person toileting, which includes using toliet, bedpan, or urinal?: A Little Help from another person bathing (including washing, rinsing, drying)?: A Little Help from another person to put on and taking off regular upper body clothing?: A Little Help from another person to put on and taking off regular lower body clothing?: A Little 6 Click Score: 19   End of Session Nurse Communication: (chair alarm pad under pt:  no green alarm box)  Activity Tolerance: Patient tolerated treatment well Patient left: in chair;with call bell/phone within reach  OT Visit Diagnosis: Muscle weakness (generalized) (M62.81)                Time: 1761-6073 OT Time Calculation (min): 25 min Charges:  OT General Charges $OT Visit: 1 Visit OT Evaluation $OT Eval Low Complexity: 1 Low OT Treatments $Self Care/Home Management : 8-22 mins  Lesle Chris, OTR/L Acute Rehabilitation Services [phone] [facility] pager [phone] office 01/22/2019  [place] 01/22/2019, 11:03 AM

## 2019-01-22 NOTE — Progress Notes (Signed)
PROGRESS NOTE    [name]  WLN:[id number] DOB: [birth year] DOA: 01/20/2019 PCP: [name], MD    Brief Narrative:   [name] is a 53 y.o. female with medical history significant of type 1 diabetes mellitus on insulin, tardive dyskinesia secondary to Reglan, coronary artery disease status post CABG, depression and severe gastroparesis on Mestinon was brought to the emergency room with 1 day of severe persistent abdominal pain and nausea. In the emergency room patient was persistently nauseous vomiting.  She was lethargic.  Bicarbonate was 14, creatinine was 1.24, blood sugars were 366, anion gap was 17 and lactic acid was 3.  She was aggressively hydrated after multiple attempts to get IV lines, 10 units of insulin bolus was given, multiple nausea medications were given and started on insulin drip because of persistent symptoms and unable to take p.o. Urinalysis shows grossly abnormal urine and was given a dose of Rocephin with cultures.  Assessment & Plan:   Principal Problem:   DKA (diabetic ketoacidoses) ([place]) Active Problems:   Diabetic neuropathy, type I diabetes mellitus ([place])   Anxiety and depression   Diabetic gastroparesis ([facility])   AKI (acute kidney injury) ([place])   HLD (hyperlipidemia)   GERD (gastroesophageal reflux disease)   Essential hypertension   Tardive dyskinesia   DKA: Improved. Her CBG'S are better, AG is closed.  But bicarb is still 20.   CBG (last 3)  Recent Labs    01/22/19 0802 01/22/19 1134 01/22/19 1600  GLUCAP 88 155* 166*  recommend to hold lantus tonight as she was hypoglycemic earlier this am.  We will start her lantus tomorrow morning at 10 am.  Her A1c is 9.8 last month.    Gram negative rod UTI;  Continue with rocephin for now along with gentle hydration.  Cultures are still pending.    Tardive dyskinesia:  Hold the zofran for now.   Hypokalemia:  Replaced. Repeat BMP in am.   GERD;  Continue with protonix.     AKI:  Probably from pre renal azotemia, resume IV hydration and repeat renal parameters in am show improvement.     Hypophosphatemia:  Replaced    Bleeding after taking the picc line out.  H&H ordered and its stable around 9.  Continue to monitor the site for swelling.     DVT prophylaxis: scd's Code Status: full code.  Family Communication:none at bedside.  Disposition Plan: pending clinical improvement.   Consultants:   NONE.   Procedures:NONE.   Antimicrobials: rocephin for UTI.    Subjective: Reports feeling weak and tired. Feels her cbg IS LOW.  Encouraged the patient to eat a snack.   Objective: Vitals:   01/21/19 2012 01/22/19 0523 01/22/19 1325 01/22/19 1642  BP: (!) 109/52 (!) 108/53 122/61 130/63  Pulse: 70 68 65 72  Resp: 18 19 15    Temp: 98 F (36.7 C) (!) 97.5 F (36.4 C) 98.5 F (36.9 C)   TempSrc: Oral Oral Oral   SpO2: 98% 99% 100% 100%  Weight:      Height:        Intake/Output Summary (Last 24 hours) at 01/22/2019 1843 Last data filed at 01/22/2019 1524 Gross per 24 hour  Intake 1560.23 ml  Output 1100 ml  Net 460.23 ml   Filed Weights   01/20/19 1100  Weight: 68 kg    Examination:  General exam: alert and comfortable.  Respiratory system: Clear to auscultation. Respiratory effort normal. No wheezing or rhonchi.  Cardiovascular system: S1 &  S2 heard, RRR. No JVD, No pedal edema. Gastrointestinal system: Abdomen is soft, no tenderness.  Central nervous system: Alert and oriented. Non focal.  Extremities: Symmetric 5 x 5 power. Skin: No rashes, lesions or ulcers Psychiatry:  Mood & affect appropriate.     Data Reviewed: I have personally reviewed following labs and imaging studies  CBC: Recent Labs  Lab 01/20/19 0731 01/20/19 0738 01/21/19 0900 01/22/19 0530 01/22/19 1739  WBC 9.9  --  9.1 5.1  --   NEUTROABS 9.1*  --  6.8  --   --   HGB 10.1* 11.2* 8.8* 8.9* 9.9*  HCT 33.5* 33.0* 29.1* 29.3* 33.4*  MCV 87.5  --   88.2 87.2  --   PLT 399  --  367 333  --    Basic Metabolic Panel: Recent Labs  Lab 01/20/19 1106 01/20/19 1726 01/20/19 2122 01/21/19 0113 01/21/19 0900 01/21/19 2055 01/22/19 0530  NA 146* 144 145 143 144 140 141  K 4.6 3.3* 3.6 3.1* 2.9* 3.2* 3.0*  CL 119* 116* 119* 119* 118* 115* 116*  CO2 11* 16* 18* 20* 18* 20* 20*  GLUCOSE 228* 147* 199* 296* 108* 93 64*  BUN 15 14 14 14 13 10 9   CREATININE 1.03* 1.04* 1.09* 1.11* 1.09* 1.02* 1.05*  CALCIUM 8.5* 8.5* 8.1* 7.9* 7.5* 7.6* 7.3*  MG 1.9 2.0  --  2.1  --   --  1.7  PHOS 2.0* 1.9*  --  1.9*  --   --  2.9   GFR: Estimated Creatinine Clearance: 57.3 mL/min (A) (by C-G formula based on SCr of 1.05 mg/dL (H)). Liver Function Tests: Recent Labs  Lab 01/20/19 0731  AST 18  ALT 13  ALKPHOS 87  BILITOT 1.1  PROT 8.1  ALBUMIN 4.0   Recent Labs  Lab 01/20/19 0731  LIPASE 24   No results for input(s): AMMONIA in the last 168 hours. Coagulation Profile: No results for input(s): INR, PROTIME in the last 168 hours. Cardiac Enzymes: No results for input(s): CKTOTAL, CKMB, CKMBINDEX, TROPONINI in the last 168 hours. BNP (last 3 results) No results for input(s): PROBNP in the last 8760 hours. HbA1C: No results for input(s): HGBA1C in the last 72 hours. CBG: Recent Labs  Lab 01/21/19 2014 01/22/19 0731 01/22/19 0802 01/22/19 1134 01/22/19 1600  GLUCAP 89 25* 88 155* 166*   Lipid Profile: No results for input(s): CHOL, HDL, LDLCALC, TRIG, CHOLHDL, LDLDIRECT in the last 72 hours. Thyroid Function Tests: No results for input(s): TSH, T4TOTAL, FREET4, T3FREE, THYROIDAB in the last 72 hours. Anemia Panel: No results for input(s): VITAMINB12, FOLATE, FERRITIN, TIBC, IRON, RETICCTPCT in the last 72 hours. Sepsis Labs: Recent Labs  Lab 01/20/19 0731  LATICACIDVEN 3.0*    Recent Results (from the past 240 hour(s))  Urine culture     Status: Abnormal (Preliminary result)   Collection Time: 01/20/19  7:31 AM    Specimen: Urine, Random  Result Value Ref Range Status   Specimen Description   Final    URINE, RANDOM Performed at [facility], [place] [address], [place], [place] [address]    Special Requests   Final    NONE Performed at [facility], [place] [address] [place], [place] [address]    Culture (A)  Final    >=100,000 COLONIES/mL GRAM NEGATIVE RODS 80,000 COLONIES/mL ENTEROBACTER AEROGENES IDENTIFICATION AND SUSCEPTIBILITIES TO FOLLOW Performed at [facility], [place] [address] [place], [place] [address]    Report  Status PENDING  Incomplete  SARS Coronavirus 2 (CEPHEID - Performed in [facility] hospital lab), Hosp Order     Status: None   Collection Time: 01/20/19  8:50 AM   Specimen: Nasopharyngeal Swab  Result Value Ref Range Status   SARS Coronavirus 2 NEGATIVE NEGATIVE Final    Comment: (NOTE) If result is NEGATIVE SARS-CoV-2 target nucleic acids are NOT DETECTED. The SARS-CoV-2 RNA is generally detectable in upper and lower  respiratory specimens during the acute phase of infection. The lowest  concentration of SARS-CoV-2 viral copies this assay can detect is 250  copies / mL. A negative result does not preclude SARS-CoV-2 infection  and should not be used as the sole basis for treatment or other  patient management decisions.  A negative result may occur with  improper specimen collection / handling, submission of specimen other  than nasopharyngeal swab, presence of viral mutation(s) within the  areas targeted by this assay, and inadequate number of viral copies  (<250 copies / mL). A negative result must be combined with clinical  observations, patient history, and epidemiological information. If result is POSITIVE SARS-CoV-2 target nucleic acids are DETECTED. The SARS-CoV-2 RNA is generally detectable in upper and lower  respiratory specimens dur ing the acute phase of infection.  Positive  results are indicative of active infection with  SARS-CoV-2.  Clinical  correlation with patient history and other diagnostic information is  necessary to determine patient infection status.  Positive results do  not rule out bacterial infection or co-infection with other viruses. If result is PRESUMPTIVE POSTIVE SARS-CoV-2 nucleic acids MAY BE PRESENT.   A presumptive positive result was obtained on the submitted specimen  and confirmed on repeat testing.  While 2019 novel coronavirus  (SARS-CoV-2) nucleic acids may be present in the submitted sample  additional confirmatory testing may be necessary for epidemiological  and / or clinical management purposes  to differentiate between  SARS-CoV-2 and other Sarbecovirus currently known to infect humans.  If clinically indicated additional testing with an alternate test  methodology [phone]) is advised. The SARS-CoV-2 RNA is generally  detectable in upper and lower respiratory sp ecimens during the acute  phase of infection. The expected result is Negative. Fact Sheet for Patients:  StrictlyIdeas.no Fact Sheet for Healthcare Providers: BankingDealers.co.za This test is not yet approved or cleared by the [place] FDA and has been authorized for detection and/or diagnosis of SARS-CoV-2 by FDA under an Emergency Use Authorization (EUA).  This EUA will remain in effect (meaning this test can be used) for the duration of the COVID-19 declaration under Section 564(b)(1) of the Act, 21 U.S.C. section 360bbb-3(b)(1), unless the authorization is terminated or revoked sooner. Performed at [facility], [place] [address] [place], [place] [address]   MRSA PCR Screening     Status: None   Collection Time: 01/20/19 10:44 AM   Specimen: Nasal Mucosa; Nasopharyngeal  Result Value Ref Range Status   MRSA by PCR NEGATIVE NEGATIVE Final    Comment:        The GeneXpert MRSA Assay (FDA approved for NASAL specimens only), is one  component of a comprehensive MRSA colonization surveillance program. It is not intended to diagnose MRSA infection nor to guide or monitor treatment for MRSA infections. Performed at [facility], [place] [address] [place], [place] [address]          Radiology Studies: No results found.      Scheduled Meds: . busPIRone  10  mg Oral TID  . Chlorhexidine Gluconate Cloth  6 each Topical Daily  . enoxaparin (LOVENOX) injection  40 mg Subcutaneous Q24H  . insulin aspart  0-15 Units Subcutaneous TID WC  . insulin aspart  0-5 Units Subcutaneous QHS  . [START ON 01/23/2019] insulin glargine  15 Units Subcutaneous BH-q7a  . metoprolol tartrate  12.5 mg Oral BID  . pantoprazole  40 mg Oral Daily  . potassium chloride  40 mEq Oral BID  . pregabalin  150 mg Oral BID  . pyridostigmine  60 mg Oral TID   Continuous Infusions: . sodium chloride 100 mL/hr at 01/22/19 1421  . cefTRIAXone (ROCEPHIN)  IV 1 g (01/22/19 0935)     LOS: 2 days    Time spent: 36 minutes.     Hosie Poisson, MD [facility] Pager [phone]   If 7PM-7AM, please contact night-coverage [email or web address] Password [email or web address] 01/22/2019, 6:43 PM

## 2019-01-23 LAB — POTASSIUM: Potassium: 4 mmol/L (ref 3.5–5.1)

## 2019-01-23 LAB — BASIC METABOLIC PANEL
Anion gap: 6 (ref 5–15)
BUN: 11 mg/dL (ref 6–20)
CO2: 17 mmol/L — ABNORMAL LOW (ref 22–32)
Calcium: 7.9 mg/dL — ABNORMAL LOW (ref 8.9–10.3)
Chloride: 111 mmol/L (ref 98–111)
Creatinine, Ser: 1.13 mg/dL — ABNORMAL HIGH (ref 0.44–1.00)
GFR calc Af Amer: >60 mL/min (ref >60)
GFR calc non Af Amer: 56 mL/min — ABNORMAL LOW (ref >60)
Glucose, Bld: 287 mg/dL — ABNORMAL HIGH (ref 70–99)
Potassium: 5.4 mmol/L — ABNORMAL HIGH (ref 3.5–5.1)
Sodium: 134 mmol/L — ABNORMAL LOW (ref 135–145)

## 2019-01-23 LAB — URINE CULTURE: Culture: >=100000 — AB

## 2019-01-23 LAB — GLUCOSE, CAPILLARY
Glucose-Capillary: 238 mg/dL — ABNORMAL HIGH (ref 70–99)
Glucose-Capillary: 257 mg/dL — ABNORMAL HIGH (ref 70–99)
Glucose-Capillary: 218 mg/dL — ABNORMAL HIGH (ref 70–99)
Glucose-Capillary: 76 mg/dL (ref 70–99)

## 2019-01-23 LAB — PREGNANCY, URINE: Preg Test, Ur: NEGATIVE

## 2019-01-23 MED ORDER — SODIUM POLYSTYRENE SULFONATE 15 GM/60ML PO SUSP
15.0000 g | Freq: Once | ORAL | Status: AC
  Administered 2019-01-23: 15 g via ORAL
  Filled 2019-01-23: qty 60

## 2019-01-23 NOTE — Progress Notes (Signed)
Inpatient Diabetes Program Recommendations  AACE/ADA: New Consensus Statement on Inpatient Glycemic Control (2015)  Target Ranges:  Prepandial:   less than 140 mg/dL      Peak postprandial:   less than 180 mg/dL (1-2 hours)      Critically ill patients:  140 - 180 mg/dL   Lab Results  Component Value Date   GLUCAP 76 01/23/2019   HGBA1C 9.8 (H) 12/15/2018    Review of Glycemic Control  Diabetes history: DM2 Outpatient Diabetes medications: Lantus 15 units QD, Novolog 4-14 units tidwc Current orders for Inpatient glycemic control: Lantus 15 units QD, Novolog 0-15 units tidwc and 0-5 units QHS  HgbA1C - 9.8% - uncontrolled Hx gastroparesis  Inpatient Diabetes Program Recommendations:     Add Novolog 3 units tidwc for meal coverage insulin  Spoke with pt regarding her HgbA1C and diabetes control at home. Pt states even when her blood sugars are in better control, she still has problems with gastroparesis. States she monitors blood sugars at least 3x/day. Discussed not skipping Lantus even when not able to eat. Answered questions. Very familiar with pt from previous admissions.  Will continue to follow.  Thank you. Lorenda Peck, RD, LDN, CDE Inpatient Diabetes Coordinator [phone]

## 2019-01-23 NOTE — Progress Notes (Signed)
Occupational Therapy Treatment Patient Details Name: [name] MRN: [id number] DOB: 1966-06-02 Today's Date: 01/23/2019    History of present illness 53 year old female with history of diabetes mellitus type 1, tardive dyskinesia secondary to Reglan, CAD status post CABG, depression, severe gastroparesis was recent hospitalizations for DKA presented with the same.    OT comments  Pt steadier with RW.  Used cane and RW during OT session. Pt has a cane at home. She doesn't want a RW.  No LOB despite being a little unsteady with cane. Educated on standing rest breaks if chair is not close by for seated rest.  Pt reports feet are more numb today.    Follow Up Recommendations  No OT follow up;Supervision - Intermittent    Equipment Recommendations  None recommended by OT(pt does not want RW)    Recommendations for Other Services      Precautions / Restrictions Precautions Precautions: Fall Restrictions Weight Bearing Restrictions: No       Mobility Bed Mobility               General bed mobility comments: oob  Transfers   Equipment used: (SPC and RW)   Sit to Stand: Supervision         General transfer comment: supervision/min guard when ambulating with cane--a little unsteady but no LOB, mod I with RW. Pt does not want this for home    Balance                                           ADL either performed or assessed with clinical judgement   ADL                                         General ADL Comments: ambulated to bathroom for toileting.  Pt a little unsteady even with SPC but no LOB.  used RW after to walk further in the hall at mod I/supervision level. Pt reports her feet are more numb today. She does not want a RW for home.  She has things in room for adl and reports she will do this in awhile once EVS finishes cleaning.  Based on clinical judgment, with RW, pt is mod I for adls, without, supervision     Vision        Perception     Praxis      Cognition Arousal/Alertness: Awake/alert Behavior During Therapy: WFL for tasks assessed/performed Overall Cognitive Status: Within Functional Limits for tasks assessed                                          Exercises     Shoulder Instructions       General Comments      Pertinent Vitals/ Pain       Pain Assessment: No/denies pain  Home Living                                          Prior Functioning/Environment              Frequency  Progress Toward Goals  OT Goals(current goals can now be found in the care plan section)  Progress towards OT goals: Goals met/education completed, patient discharged from [place] "6 Clicks" Daily Activity     Outcome Measure   Help from another person eating meals?: None Help from another person taking care of personal grooming?: None Help from another person toileting, which includes using toliet, bedpan, or urinal?: None Help from another person bathing (including washing, rinsing, drying)?: None Help from another person to put on and taking off regular upper body clothing?: None Help from another person to put on and taking off regular lower body clothing?: None 6 Click Score: 24    End of Session        Activity Tolerance Patient tolerated treatment well   Patient Left in chair;with call bell/phone within reach   Nurse Communication          Time: 9968-9570 OT Time Calculation (min): 23 min  Charges: OT General Charges $OT Visit: 1 Visit OT Treatments $Self Care/Home Management : 8-22 mins  [name], OTR/L Acute Rehabilitation Services [phone] [facility] pager [phone] office 01/23/2019   [place] 01/23/2019, 11:28 AM

## 2019-01-23 NOTE — Care Management Important Message (Signed)
Important Message  Patient Details IM Letter given to Kathrin Greathouse SW to present to the Patient Name: [name] MRN: [id number] Date of Birth: 08-Nov-1965   Medicare Important Message Given:  Yes     Kerin Salen 01/23/2019, 12:03 PM

## 2019-01-26 ENCOUNTER — Telehealth: Payer: Self-pay

## 2019-01-26 NOTE — Telephone Encounter (Signed)
  From the discharge call.  She has an appointment with Dr Margarita Rana 02/03/2019.    She was complaining of constipation.  She said that she took dulcolax and that didn't work and today she took 3 colace and still has not moved her bowels.  She stated that she has been staying well hydrated. Informed her that the provider would be notified.     Besides the constipation she was very vocal about her poor venous access when she is in the hospital.  She said that she really wants to have a port placed and she wants to discuss her request with Dr Margarita Rana.  She explained that she feels like she is being " tortured" when they attempt to place an IV as she experiences excruciating pain . She believes that she should be eligible for port placement even though she is not a cancer patient. She said that she understands the risk of infection when having such a port placed and is willing to take that risk.

## 2019-01-26 NOTE — Telephone Encounter (Signed)
Transition Care Management Follow-up Telephone Call  Date of discharge and from where: 01/23/2019, [facility]  How have you been since you were released from the hospital? She was complaining of constipation.  She said that she took dulcolax and that didn't work and today she took 3 colace and still has not moved her bowels.  She stated that she has been staying well hydrated. Informed her that the provider would be notified.   Any questions or concerns? Besides the constipation she was very vocal about her poor venous access when she is in the hospital.  She said that she really wants to have a port placed and she wants to discuss her request with Dr Margarita Rana.  She explained that she feels like she is being " tortured" when they attempt to place an IV as she experiences excruciating pain . She believes that she should be eligible for port placement even though she is not a cancer patient. She said that she understands the risk of infection when having such a port placed and is willing to take that risk.    Items Reviewed:  Did the pt receive and understand the discharge instructions provided? yes  Medications obtained and verified? Yes she has the instructions and did not have any questions. She said that she has all medications including the antibiotics. No questions about her medication regime.   Any new allergies since your discharge? None reported   Do you have support at home? Lives with sister. She said that her family is getting tired of her being sick and having multiple health problems.   Other (ie: DME, Home Health, etc) no home health at this time.   Has a glucometer. Blood sugar this morning : 135, this afternoon : 207.   Functional Questionnaire: (I = Independent and D = Dependent) ADL's: independent.  Has cane to use if needed.    Follow up appointments reviewed:    PCP Hospital f/u appt confirmed? Appointment scheduled with Dr Margarita Rana 02/03/2019 @ 1050. Informed her  that she would be notified if the appointment is in person or televisit.   [facility] f/u appt confirmed? Cardiology appointment  - 02/13/2019.  Are transportation arrangements needed? She has used [facility] in the past.   If their condition worsens, is the pt aware to call  their PCP or go to the ED?yes   Was the patient provided with contact information for the PCP's office or ED? She has the clinic phone number  Was the pt encouraged to call back with questions or concerns? yes

## 2019-01-27 NOTE — Telephone Encounter (Signed)
Letter mailed to patients home address on file.

## 2019-01-28 MED ORDER — SENNOSIDES-DOCUSATE SODIUM 8.6-50 MG PO TABS: 1.0000 | ORAL_TABLET | Freq: Every day | ORAL | 0 refills | Status: DC | PRN

## 2019-01-28 NOTE — Telephone Encounter (Signed)
Patient was called and informed to get OTC miralax, Patient states that the medications does not work for her.

## 2019-01-29 NOTE — Telephone Encounter (Signed)
Patient was called and informed of medication being sent to pharmacy. 

## 2019-02-03 ENCOUNTER — Ambulatory Visit: Payer: Medicare HMO | Admitting: Family Medicine

## 2019-02-13 ENCOUNTER — Emergency Department (HOSPITAL_COMMUNITY): Payer: Medicare HMO

## 2019-02-13 ENCOUNTER — Observation Stay (HOSPITAL_COMMUNITY)
Admission: EM | Admit: 2019-02-13 | Discharge: 2019-02-15 | Disposition: A | Discharge status: Home / Self Care | Payer: Medicare HMO | Attending: Internal Medicine | Admitting: Internal Medicine

## 2019-02-13 ENCOUNTER — Observation Stay (HOSPITAL_COMMUNITY): Payer: Medicare HMO

## 2019-02-13 ENCOUNTER — Ambulatory Visit: Payer: Medicare HMO | Admitting: Cardiovascular Disease

## 2019-02-13 ENCOUNTER — Other Ambulatory Visit: Payer: Self-pay

## 2019-02-13 DIAGNOSIS — F419 Anxiety disorder, unspecified: Secondary | ICD-10-CM | POA: Diagnosis not present

## 2019-02-13 DIAGNOSIS — E1010 Type 1 diabetes mellitus with ketoacidosis without coma: Principal | ICD-10-CM | POA: Insufficient documentation

## 2019-02-13 DIAGNOSIS — Z79899 Other long term (current) drug therapy: Secondary | ICD-10-CM | POA: Insufficient documentation

## 2019-02-13 DIAGNOSIS — Z8249 Family history of ischemic heart disease and other diseases of the circulatory system: Secondary | ICD-10-CM | POA: Insufficient documentation

## 2019-02-13 DIAGNOSIS — K219 Gastro-esophageal reflux disease without esophagitis: Secondary | ICD-10-CM | POA: Insufficient documentation

## 2019-02-13 DIAGNOSIS — R109 Unspecified abdominal pain: Secondary | ICD-10-CM | POA: Diagnosis not present

## 2019-02-13 DIAGNOSIS — I2510 Atherosclerotic heart disease of native coronary artery without angina pectoris: Secondary | ICD-10-CM | POA: Diagnosis not present

## 2019-02-13 DIAGNOSIS — Z7982 Long term (current) use of aspirin: Secondary | ICD-10-CM | POA: Diagnosis not present

## 2019-02-13 DIAGNOSIS — E785 Hyperlipidemia, unspecified: Secondary | ICD-10-CM | POA: Insufficient documentation

## 2019-02-13 DIAGNOSIS — I252 Old myocardial infarction: Secondary | ICD-10-CM | POA: Diagnosis not present

## 2019-02-13 DIAGNOSIS — F32A Depression, unspecified: Secondary | ICD-10-CM | POA: Diagnosis present

## 2019-02-13 DIAGNOSIS — Z20828 Contact with and (suspected) exposure to other viral communicable diseases: Secondary | ICD-10-CM | POA: Diagnosis not present

## 2019-02-13 DIAGNOSIS — I10 Essential (primary) hypertension: Secondary | ICD-10-CM | POA: Diagnosis not present

## 2019-02-13 DIAGNOSIS — Z794 Long term (current) use of insulin: Secondary | ICD-10-CM | POA: Diagnosis not present

## 2019-02-13 DIAGNOSIS — E0810 Diabetes mellitus due to underlying condition with ketoacidosis without coma: Secondary | ICD-10-CM

## 2019-02-13 DIAGNOSIS — E860 Dehydration: Secondary | ICD-10-CM | POA: Diagnosis not present

## 2019-02-13 DIAGNOSIS — G2401 Drug induced subacute dyskinesia: Secondary | ICD-10-CM | POA: Diagnosis not present

## 2019-02-13 DIAGNOSIS — K3184 Gastroparesis: Secondary | ICD-10-CM | POA: Insufficient documentation

## 2019-02-13 DIAGNOSIS — F329 Major depressive disorder, single episode, unspecified: Secondary | ICD-10-CM | POA: Insufficient documentation

## 2019-02-13 DIAGNOSIS — E1110 Type 2 diabetes mellitus with ketoacidosis without coma: Secondary | ICD-10-CM | POA: Diagnosis present

## 2019-02-13 DIAGNOSIS — N179 Acute kidney failure, unspecified: Secondary | ICD-10-CM | POA: Diagnosis not present

## 2019-02-13 DIAGNOSIS — D509 Iron deficiency anemia, unspecified: Secondary | ICD-10-CM | POA: Diagnosis not present

## 2019-02-13 DIAGNOSIS — E1043 Type 1 diabetes mellitus with diabetic autonomic (poly)neuropathy: Secondary | ICD-10-CM | POA: Insufficient documentation

## 2019-02-13 DIAGNOSIS — Z951 Presence of aortocoronary bypass graft: Secondary | ICD-10-CM | POA: Insufficient documentation

## 2019-02-13 LAB — COMPREHENSIVE METABOLIC PANEL
ALT: 15 U/L (ref 0–44)
AST: 20 U/L (ref 15–41)
Albumin: 4.3 g/dL (ref 3.5–5.0)
Alkaline Phosphatase: 95 U/L (ref 38–126)
Anion gap: 19 — ABNORMAL HIGH (ref 5–15)
BUN: 21 mg/dL — ABNORMAL HIGH (ref 6–20)
CO2: 14 mmol/L — ABNORMAL LOW (ref 22–32)
Calcium: 9.8 mg/dL (ref 8.9–10.3)
Chloride: 109 mmol/L (ref 98–111)
Creatinine, Ser: 1.55 mg/dL — ABNORMAL HIGH (ref 0.44–1.00)
GFR calc Af Amer: 44 mL/min — ABNORMAL LOW (ref >60)
GFR calc non Af Amer: 38 mL/min — ABNORMAL LOW (ref >60)
Glucose, Bld: 493 mg/dL — ABNORMAL HIGH (ref 70–99)
Potassium: 3.9 mmol/L (ref 3.5–5.1)
Sodium: 142 mmol/L (ref 135–145)
Total Bilirubin: 0.9 mg/dL (ref 0.3–1.2)
Total Protein: 8.4 g/dL — ABNORMAL HIGH (ref 6.5–8.1)

## 2019-02-13 LAB — POCT I-STAT EG7
Acid-base deficit: 8 mmol/L — ABNORMAL HIGH (ref 0.0–2.0)
Bicarbonate: 17.2 mmol/L — ABNORMAL LOW (ref 20.0–28.0)
Calcium, Ion: 1.2 mmol/L (ref 1.15–1.40)
HCT: 37 % (ref 36.0–46.0)
Hemoglobin: 12.6 g/dL (ref 12.0–15.0)
O2 Saturation: 93 %
Potassium: 3.9 mmol/L (ref 3.5–5.1)
Sodium: 142 mmol/L (ref 135–145)
TCO2: 18 mmol/L — ABNORMAL LOW (ref 22–32)
pCO2, Ven: 34.9 mmHg — ABNORMAL LOW (ref 44.0–60.0)
pH, Ven: 7.3 (ref 7.250–7.430)
pO2, Ven: 73 mmHg — ABNORMAL HIGH (ref 32.0–45.0)

## 2019-02-13 LAB — BASIC METABOLIC PANEL
Anion gap: 13 (ref 5–15)
BUN: 29 mg/dL — ABNORMAL HIGH (ref 6–20)
CO2: 16 mmol/L — ABNORMAL LOW (ref 22–32)
Calcium: 9.6 mg/dL (ref 8.9–10.3)
Chloride: 118 mmol/L — ABNORMAL HIGH (ref 98–111)
Creatinine, Ser: 1.67 mg/dL — ABNORMAL HIGH (ref 0.44–1.00)
GFR calc Af Amer: 41 mL/min — ABNORMAL LOW (ref >60)
GFR calc non Af Amer: 35 mL/min — ABNORMAL LOW (ref >60)
Glucose, Bld: 268 mg/dL — ABNORMAL HIGH (ref 70–99)
Potassium: 4.5 mmol/L (ref 3.5–5.1)
Sodium: 147 mmol/L — ABNORMAL HIGH (ref 135–145)

## 2019-02-13 LAB — LACTIC ACID, PLASMA
Lactic Acid, Venous: 3.8 mmol/L (ref 0.5–1.9)
Lactic Acid, Venous: 3.1 mmol/L (ref 0.5–1.9)

## 2019-02-13 LAB — CBC WITH DIFFERENTIAL/PLATELET
Abs Immature Granulocytes: 0.03 10*3/uL (ref 0.00–0.07)
Basophils Absolute: 0 10*3/uL (ref 0.0–0.1)
Basophils Relative: 0 %
Eosinophils Absolute: 0 10*3/uL (ref 0.0–0.5)
Eosinophils Relative: 0 %
HCT: 38.2 % (ref 36.0–46.0)
Hemoglobin: 11 g/dL — ABNORMAL LOW (ref 12.0–15.0)
Immature Granulocytes: 0 %
Lymphocytes Relative: 6 %
Lymphs Abs: 0.7 10*3/uL (ref 0.7–4.0)
MCH: 24.8 pg — ABNORMAL LOW (ref 26.0–34.0)
MCHC: 28.8 g/dL — ABNORMAL LOW (ref 30.0–36.0)
MCV: 86 fL (ref 80.0–100.0)
Monocytes Absolute: 0.2 10*3/uL (ref 0.1–1.0)
Monocytes Relative: 2 %
Neutro Abs: 9.4 10*3/uL — ABNORMAL HIGH (ref 1.7–7.7)
Neutrophils Relative %: 92 %
Platelets: 463 10*3/uL — ABNORMAL HIGH (ref 150–400)
RBC: 4.44 MIL/uL (ref 3.87–5.11)
RDW: 14.6 % (ref 11.5–15.5)
WBC: 10.4 10*3/uL (ref 4.0–10.5)
nRBC: 0 % (ref 0.0–0.2)

## 2019-02-13 LAB — URINALYSIS, ROUTINE W REFLEX MICROSCOPIC
Bacteria, UA: NONE SEEN
Bilirubin Urine: NEGATIVE
Glucose, UA: >=500 mg/dL — AB
Ketones, ur: 80 mg/dL — AB
Leukocytes,Ua: NEGATIVE
Nitrite: NEGATIVE
Protein, ur: 100 mg/dL — AB
Specific Gravity, Urine: 1.026 (ref 1.005–1.030)
pH: 5 (ref 5.0–8.0)

## 2019-02-13 LAB — GLUCOSE, CAPILLARY
Glucose-Capillary: 256 mg/dL — ABNORMAL HIGH (ref 70–99)
Glucose-Capillary: 206 mg/dL — ABNORMAL HIGH (ref 70–99)
Glucose-Capillary: 147 mg/dL — ABNORMAL HIGH (ref 70–99)

## 2019-02-13 LAB — CBG MONITORING, ED
Glucose-Capillary: 421 mg/dL — ABNORMAL HIGH (ref 70–99)
Glucose-Capillary: 387 mg/dL — ABNORMAL HIGH (ref 70–99)
Glucose-Capillary: 430 mg/dL — ABNORMAL HIGH (ref 70–99)

## 2019-02-13 LAB — SARS CORONAVIRUS 2 BY RT PCR (HOSPITAL ORDER, PERFORMED IN ~~LOC~~ HOSPITAL LAB): SARS Coronavirus 2: NEGATIVE

## 2019-02-13 LAB — HCG, QUANTITATIVE, PREGNANCY: hCG, Beta Chain, Quant, S: 2 m[IU]/mL (ref <5)

## 2019-02-13 MED ORDER — INSULIN REGULAR(HUMAN) IN NACL 100-0.9 UT/100ML-% IV SOLN
INTRAVENOUS | Status: DC
  Administered 2019-02-13: 3.3 [IU]/h via INTRAVENOUS
  Filled 2019-02-13: qty 100

## 2019-02-13 MED ORDER — METOPROLOL TARTRATE 12.5 MG HALF TABLET
12.5000 mg | ORAL_TABLET | Freq: Two times a day (BID) | ORAL | Status: DC
  Administered 2019-02-14 – 2019-02-15 (×3): 12.5 mg via ORAL
  Filled 2019-02-13 (×3): qty 1

## 2019-02-13 MED ORDER — DEXTROSE-NACL 5-0.45 % IV SOLN
INTRAVENOUS | Status: DC
  Administered 2019-02-13: 23:00:00 via INTRAVENOUS

## 2019-02-13 MED ORDER — ONDANSETRON HCL 4 MG/2ML IJ SOLN
4.0000 mg | Freq: Once | INTRAMUSCULAR | Status: AC
  Administered 2019-02-13: 4 mg via INTRAVENOUS
  Filled 2019-02-13: qty 2

## 2019-02-13 MED ORDER — SODIUM CHLORIDE 0.9 % IV BOLUS
1000.0000 mL | Freq: Once | INTRAVENOUS | Status: AC
  Administered 2019-02-13: 17:00:00 1000 mL via INTRAVENOUS

## 2019-02-13 MED ORDER — INSULIN GLARGINE 100 UNIT/ML ~~LOC~~ SOLN
15.0000 [IU] | Freq: Once | SUBCUTANEOUS | Status: AC
  Administered 2019-02-13: 15 [IU] via SUBCUTANEOUS
  Filled 2019-02-13: qty 0.15

## 2019-02-13 MED ORDER — SODIUM CHLORIDE 0.9 % IV SOLN: INTRAVENOUS | Status: AC

## 2019-02-13 MED ORDER — POTASSIUM CHLORIDE 10 MEQ/100ML IV SOLN
10.0000 meq | INTRAVENOUS | Status: AC
  Administered 2019-02-13: 23:00:00 10 meq via INTRAVENOUS
  Filled 2019-02-13: qty 100

## 2019-02-13 MED ORDER — PREGABALIN 75 MG PO CAPS
150.0000 mg | ORAL_CAPSULE | Freq: Two times a day (BID) | ORAL | Status: DC
  Administered 2019-02-14 – 2019-02-15 (×3): 150 mg via ORAL
  Filled 2019-02-13 (×3): qty 2

## 2019-02-13 MED ORDER — PROCHLORPERAZINE EDISYLATE 10 MG/2ML IJ SOLN
10.0000 mg | Freq: Once | INTRAMUSCULAR | Status: AC
  Administered 2019-02-13: 10 mg via INTRAMUSCULAR
  Filled 2019-02-13: qty 2

## 2019-02-13 MED ORDER — BUSPIRONE HCL 5 MG PO TABS
10.0000 mg | ORAL_TABLET | Freq: Three times a day (TID) | ORAL | Status: DC
  Administered 2019-02-14 – 2019-02-15 (×5): 10 mg via ORAL
  Filled 2019-02-13 (×5): qty 2

## 2019-02-13 MED ORDER — PANTOPRAZOLE SODIUM 40 MG PO TBEC
40.0000 mg | DELAYED_RELEASE_TABLET | Freq: Every day | ORAL | Status: DC
  Administered 2019-02-14 – 2019-02-15 (×2): 40 mg via ORAL
  Filled 2019-02-13 (×2): qty 1

## 2019-02-13 MED ORDER — PYRIDOSTIGMINE BROMIDE 60 MG PO TABS
60.0000 mg | ORAL_TABLET | Freq: Three times a day (TID) | ORAL | Status: DC
  Administered 2019-02-14 (×3): 60 mg via ORAL
  Filled 2019-02-13 (×7): qty 1

## 2019-02-13 MED ORDER — INSULIN REGULAR(HUMAN) IN NACL 100-0.9 UT/100ML-% IV SOLN
INTRAVENOUS | Status: DC
  Administered 2019-02-13: 2.6 [IU]/h via INTRAVENOUS

## 2019-02-13 MED ORDER — ASPIRIN EC 81 MG PO TBEC
81.0000 mg | DELAYED_RELEASE_TABLET | Freq: Every day | ORAL | Status: DC
  Administered 2019-02-14 – 2019-02-15 (×2): 81 mg via ORAL
  Filled 2019-02-13 (×2): qty 1

## 2019-02-13 MED ORDER — HEPARIN SODIUM (PORCINE) 5000 UNIT/ML IJ SOLN
5000.0000 [IU] | Freq: Three times a day (TID) | INTRAMUSCULAR | Status: DC
  Administered 2019-02-13 – 2019-02-15 (×5): 5000 [IU] via SUBCUTANEOUS
  Filled 2019-02-13 (×6): qty 1

## 2019-02-13 MED ORDER — POTASSIUM CHLORIDE 10 MEQ/50ML IV SOLN
10.0000 meq | INTRAVENOUS | Status: AC
  Administered 2019-02-13: 19:00:00 10 meq via INTRAVENOUS
  Filled 2019-02-13: qty 50

## 2019-02-13 MED ORDER — HALOPERIDOL LACTATE 5 MG/ML IJ SOLN
5.0000 mg | Freq: Once | INTRAMUSCULAR | Status: AC
  Administered 2019-02-13: 5 mg via INTRAMUSCULAR
  Filled 2019-02-13: qty 1

## 2019-02-13 MED ORDER — SODIUM CHLORIDE 0.9 % IV SOLN
INTRAVENOUS | Status: DC
  Administered 2019-02-14 – 2019-02-15 (×2): via INTRAVENOUS

## 2019-02-13 MED ORDER — POTASSIUM CHLORIDE 10 MEQ/100ML IV SOLN
10.0000 meq | INTRAVENOUS | Status: AC
  Administered 2019-02-13: 17:00:00 10 meq via INTRAVENOUS
  Filled 2019-02-13 (×2): qty 100

## 2019-02-13 NOTE — ED Triage Notes (Signed)
Pt was brought to ED with c/o n/v since yesterday (per pt), Hx of gastroparesis. EMS reports CBG of 505 on scene as well.

## 2019-02-13 NOTE — ED Notes (Signed)
attempted to call report

## 2019-02-13 NOTE — ED Notes (Signed)
MD Bero at bedside inserting central line at this time.

## 2019-02-13 NOTE — ED Provider Notes (Signed)
[facility] EMERGENCY DEPARTMENT Provider Note   CSN: [id number] Arrival date & time: 02/13/19  1235     History   Chief Complaint No chief complaint on file.   HPI [name] is a 53 y.o. female.     Patient with history of tardive dyskinesia, diabetes with frequent episodes of DKA, poor vascular access, diabetic gastroparesis, coronary artery disease --presents to the emergency department today with 2 days of vomiting.  Patient brought to the emergency department by EMS from her sister's house.  Blood sugar of 505 on scene.  Patient with persistent vomiting with associated abdominal pain.  She is tachypneic.  Level 5 caveat due to altered mental status.  Patient with significant tardive dyskinesia and heavy rapid breathing at the current time.  Patient is a poor historian making history taking difficult.  Reviewed previous ED visit and admission.     Past Medical History:  Diagnosis Date  . AKI (acute kidney injury) ([place])   . Anemia, iron deficiency   . Anxiety and depression 05/18/2015  . CAD (coronary artery disease), native coronary artery 01/11/2018   Non-STEMI with severe three-vessel disease noted July 2019  . Depression   . Diabetes mellitus without complication ([place])   . Diabetic gastroparesis ([place])   . Diabetic neuropathy, type I diabetes mellitus ([place]) 05/18/2015  . DKA (diabetic ketoacidoses) ([place]) 06/16/2017  . DKA, type 1 ([place]) 03/18/2016  . Essential hypertension   . Gastroparesis   . GERD (gastroesophageal reflux disease)   . HLD (hyperlipidemia)   . MDD (major depressive disorder), single episode, severe , no psychosis ([place])   . Non-ST elevation (NSTEMI) myocardial infarction ([place])   . S/P CABG x 3 01/14/2018  . Tardive dyskinesia   . Vitamin B12 deficiency 08/16/2015    Patient Active Problem List   Diagnosis Date Noted  . Acute encephalopathy 12/22/2018  . Diabetic ketoacidosis ([place]) 04/30/2018  . Gastroparesis 04/13/2018  . DKA  (diabetic ketoacidosis) ([place]) 03/31/2018  . S/P CABG x 3 01/14/2018  . CAD (coronary artery disease), native coronary artery 01/11/2018  . Dehydration   . MDD (major depressive disorder), single episode, severe , no psychosis ([place])   . DKA (diabetic ketoacidoses) ([place]) 06/16/2017  . Tardive dyskinesia   . DKA, type 1 ([place]) 03/18/2016  . Noncompliance with treatment plan 03/13/2016  . Essential hypertension 01/24/2016  . HLD (hyperlipidemia)   . GERD (gastroesophageal reflux disease)   . AKI (acute kidney injury) ([place])   . Anemia, iron deficiency   . Vitamin B12 deficiency 08/16/2015  . Diabetic gastroparesis ([place])   . Diabetic neuropathy, type I diabetes mellitus ([place]) 05/18/2015  . Anxiety and depression 05/18/2015    Past Surgical History:  Procedure Laterality Date  . CARDIAC SURGERY    . COLONOSCOPY  09/27/2014   at [facility]  . CORONARY ARTERY BYPASS GRAFT N/A 01/14/2018   Procedure: CORONARY ARTERY BYPASS GRAFTING (CABG)X3, RIGHT AND LEFT SAPHENOUS VEIN HARVEST, MAMMARY TAKE DOWN. MAMMARY TO LAD, SVG TO PD, SVG TO DISTAL CIRC.;  Surgeon: Grace Isaac, MD;  Location: [facility];  Service: Open Heart Surgery;  Laterality: N/A;  . ESOPHAGOGASTRODUODENOSCOPY  09/27/2014   at [facility], Dr Rolan Lipa. biospy neg for celiac, neg for H pylori.   . EYE SURGERY    . gailstones    . IR FLUORO GUIDE CV LINE RIGHT  02/01/2017  . IR FLUORO GUIDE CV LINE RIGHT  03/06/2017  . IR FLUORO GUIDE CV LINE RIGHT  03/25/2017  .  IR GENERIC HISTORICAL  01/24/2016   IR FLUORO GUIDE CV LINE RIGHT 01/24/2016 Darrell K Allred, PA-C [facility]  . IR GENERIC HISTORICAL  01/24/2016   IR US GUIDE VASC ACCESS RIGHT 01/24/2016 Darrell K Allred, PA-C [facility]  . IR US GUIDE VASC ACCESS RIGHT  02/01/2017  . IR US GUIDE VASC ACCESS RIGHT  03/06/2017  . IR US GUIDE VASC ACCESS RIGHT  03/25/2017  . IR US GUIDE VASC ACCESS RIGHT  01/20/2019  . IR VENIPUNCTURE 58YRS/OLDER BY MD  01/20/2019  . LEFT HEART CATH AND CORONARY  ANGIOGRAPHY N/A 01/07/2018   Procedure: LEFT HEART CATH AND CORONARY ANGIOGRAPHY;  Surgeon: Troy Sine, MD;  Location: [facility];  Service: Cardiovascular;  Laterality: N/A;  . POSTERIOR VITRECTOMY AND MEMBRANE PEEL-LEFT EYE  09/28/2002  . POSTERIOR VITRECTOMY AND MEMBRANE PEEL-RIGHT EYE  03/16/2002  . RETINAL DETACHMENT SURGERY    . TEE WITHOUT CARDIOVERSION N/A 01/14/2018   Procedure: TRANSESOPHAGEAL ECHOCARDIOGRAM (TEE);  Surgeon: Grace Isaac, MD;  Location: [facility];  Service: Open Heart Surgery;  Laterality: N/A;     OB History    Gravida  2   Para      Term      Preterm      AB  1   Living  1     SAB      TAB  1   Ectopic      Multiple      Live Births               Home Medications    Prior to Admission medications   Medication Sig Start Date End Date Taking? Authorizing Provider  ammonium lactate (AMLACTIN) 12 % cream Apply topically as needed for dry skin. 07/08/18   Trula Slade, DPM  aspirin EC 81 MG tablet Take 1 tablet (81 mg total) by mouth daily. 06/03/18   Nahser, Wonda Cheng, MD  Blood Glucose Monitoring Suppl (ONETOUCH VERIO) w/Device KIT Use as directed 03/21/18   [name], MD  busPIRone (BUSPAR) 10 MG tablet Take 1 tablet (10 mg total) by mouth 3 (three) times daily. 06/12/18   [name], MD  ciprofloxacin (CIPRO) 500 MG tablet Take 1 tablet (500 mg total) by mouth 2 (two) times daily. 01/23/19   Hosie Poisson, MD  docusate sodium (COLACE) 100 MG capsule Take 1 capsule (100 mg total) by mouth daily as needed for mild constipation. 02/03/18   Burtis Junes, NP  glucose blood (ACCU-CHEK AVIVA PLUS) test strip Use as instructed to test blood sugar 4 times daily Dx E10.65 12/18/18   Elayne Snare, MD  insulin aspart (NOVOLOG) 100 UNIT/ML injection Inject 0-12 Units into the skin 3 (three) times daily before meals. As per sliding scale 10/14/18   Elayne Snare, MD  Insulin Glargine (LANTUS SOLOSTAR) 100 UNIT/ML Solostar Pen  Inject 15 units under the skin once daily in the morning. 10/14/18   Elayne Snare, MD  Insulin Syringe-Needle U-100 (BD INSULIN SYRINGE ULTRAFINE) 31G X 5/16" 0.5 ML MISC 1 each by Does not apply route 4 (four) times daily. 04/28/18   Elayne Snare, MD  Lancets [facility] DELICA PLUS [id number]) [place] 1 Units by Does not apply route as directed. 03/21/18   [name], MD  loperamide (IMODIUM) 2 MG capsule Take 1 capsule (2 mg total) by mouth 3 (three) times daily as needed for diarrhea or loose stools. 12/25/18   Aline August, MD  metoprolol tartrate (LOPRESSOR) 25 MG tablet  Take 0.5 tablets (12.5 mg total) by mouth 2 (two) times daily. 10/08/18   Nahser, Wonda Cheng, MD  pantoprazole (PROTONIX) 40 MG tablet Take 1 tablet (40 mg total) by mouth daily. 06/12/18   [name], MD  pregabalin (LYRICA) 150 MG capsule Take 1 capsule (150 mg total) by mouth 2 (two) times daily. 01/02/19   Elayne Snare, MD  promethazine (PHENERGAN) 12.5 MG tablet Take 1 tablet (12.5 mg total) by mouth every 8 (eight) hours as needed for up to 20 doses for nausea or vomiting. 01/22/19   Hosie Poisson, MD  pyridostigmine (MESTINON) 60 MG tablet Take 60 mg by mouth 3 (three) times daily.    [provider]  senna-docusate (SENOKOT-S) 8.6-50 MG tablet Take 1 tablet by mouth daily as needed for mild constipation. 01/28/19   Ladell Pier, MD    Family History Family History  Problem Relation Age of Onset  . Cystic fibrosis Mother   . Hypertension Father   . Diabetes Brother   . Hypertension Maternal Grandmother     Social History Social History   Tobacco Use  . Smoking status: Never Smoker  . Smokeless tobacco: Never Used  Substance Use Topics  . Alcohol use: No  . Drug use: Yes    Types: Marijuana    Comment: occ     Allergies   Anesthetics, amide; Penicillins; Buprenorphine hcl; Encainide; and Metoclopramide   Review of Systems Review of Systems  Unable to perform ROS: Acuity of condition      Physical Exam Updated Vital Signs BP (!) 182/80   Pulse (!) 102   Temp 98.2 F (36.8 C) (Oral)   Resp (!) 28   SpO2 100%   Physical Exam Vitals signs and nursing note reviewed.  Constitutional:      Appearance: She is well-developed.  HENT:     Head: Normocephalic and atraumatic.     Mouth/Throat:     Mouth: Mucous membranes are dry.     Comments: Patient with rhythmic lipsmacking consistent with history of tardive dyskinesia. Eyes:     General:        Right eye: No discharge.        Left eye: No discharge.     Conjunctiva/sclera: Conjunctivae normal.  Neck:     Musculoskeletal: Normal range of motion and neck supple.  Cardiovascular:     Rate and Rhythm: Regular rhythm. Tachycardia present.     Heart sounds: Normal heart sounds.  Pulmonary:     Effort: Pulmonary effort is normal. Tachypnea present.     Breath sounds: Normal breath sounds. No wheezing or rales.     Comments: Patient with K[place]ul type respirations, rapid and deep.  No distress. Abdominal:     Palpations: Abdomen is soft.     Tenderness: There is abdominal tenderness. There is no guarding or rebound.     Comments: Patient with generalized abdominal tenderness, no apparent localization.  Skin:    General: Skin is warm and dry.  Neurological:     Mental Status: She is alert.      ED Treatments / Results  Labs (all labs ordered are listed, but only abnormal results are displayed) Labs Reviewed  CBC WITH DIFFERENTIAL/PLATELET - Abnormal; Notable for the following components:      Result Value   Hemoglobin 11.0 (*)    MCH 24.8 (*)    MCHC 28.8 (*)    Platelets 463 (*)    Neutro Abs 9.4 (*)    All  other components within normal limits  COMPREHENSIVE METABOLIC PANEL - Abnormal; Notable for the following components:   CO2 14 (*)    Glucose, Bld 493 (*)    BUN 21 (*)    Creatinine, Ser 1.55 (*)    Total Protein 8.4 (*)    GFR calc non Af Amer 38 (*)    GFR calc Af Amer 44 (*)    Anion gap 19 (*)     All other components within normal limits  LACTIC ACID, PLASMA - Abnormal; Notable for the following components:   Lactic Acid, Venous 3.8 (*)    All other components within normal limits  CBG MONITORING, ED - Abnormal; Notable for the following components:   Glucose-Capillary 421 (*)    All other components within normal limits  POCT I-STAT EG7 - Abnormal; Notable for the following components:   pCO2, Ven 34.9 (*)    pO2, Ven 73.0 (*)    Bicarbonate 17.2 (*)    TCO2 18 (*)    Acid-base deficit 8.0 (*)    All other components within normal limits  URINE CULTURE  SARS CORONAVIRUS 2 (HOSPITAL ORDER, [facility])  URINALYSIS, ROUTINE W REFLEX MICROSCOPIC  I-STAT VENOUS BLOOD GAS, ED    EKG EKG Interpretation  Date/Time:  Friday February 13 2019 12:39:45 EDT Ventricular Rate:  101 PR Interval:    QRS Duration: 81 QT Interval:  359 QTC Calculation: 466 R Axis:   -24 Text Interpretation:  Sinus tachycardia Borderline left axis deviation Nonspecific repol abnormality, diffuse leads Confirmed by Gerlene Fee [phone]) on 02/13/2019 2:20:04 PM   Radiology Dg Chest Port 1 View  Result Date: 02/13/2019 CLINICAL DATA:  Shortness of breath EXAM: PORTABLE CHEST 1 VIEW COMPARISON:  12/22/2018 FINDINGS: Cardiac shadows within normal limits. Postsurgical changes are noted. Lungs are clear. No bony abnormality is noted. IMPRESSION: No acute abnormality seen Electronically Signed   By: Inez Catalina M.D.   On: 02/13/2019 13:24    Procedures Procedures (including critical care time)  Medications Ordered in ED Medications  sodium chloride 0.9 % bolus 1,000 mL (has no administration in time range)  insulin regular, human (MYXREDLIN) 100 units/ 100 mL infusion (has no administration in time range)  potassium chloride 10 mEq in 100 mL IVPB (has no administration in time range)  ondansetron (ZOFRAN) injection 4 mg (has no administration in time range)  prochlorperazine  (COMPAZINE) injection 10 mg (10 mg Intramuscular Given 02/13/19 1448)  haloperidol lactate (HALDOL) injection 5 mg (5 mg Intramuscular Given 02/13/19 1532)     Initial Impression / Assessment and Plan / ED Course  I have reviewed the triage vital signs and the nursing notes.  Pertinent labs & imaging results that were available during my care of the patient were reviewed by me and considered in my medical decision making (see chart for details).        Patient seen and examined. Work-up initiated.   Vital signs reviewed and are as follows: BP (!) 182/80   Pulse (!) 102   Temp 98.2 F (36.8 C) (Oral)   Resp (!) 28   SpO2 100%   Delay in obtaining IV due to the patient's well-documented poor vascular access.  Blood was obtained.  Patient does have signs of DKA today with a bicarbonate of 14 and an anion gap of 19.  Patient discussed with and seen by Dr. Sedonia Small.  Central line will be placed for administration of insulin and hydration.  Patient  will need admission to the hospital.  Patient with active dry heaving earlier treated with IM Compazine with improvement in symptoms.  DKA order set placed including potassium supplementation.   CRITICAL CARE Performed by: Carlisle Cater PA-C  Total critical care time: 45 minutes Critical care time was exclusive of separately billable procedures and treating other patients. Critical care was necessary to treat or prevent imminent or life-threatening deterioration. Critical care was time spent personally by me on the following activities: development of treatment plan with patient and/or surrogate as well as nursing, discussions with consultants, evaluation of patient's response to treatment, examination of patient, obtaining history from patient or surrogate, ordering and performing treatments and interventions, ordering and review of laboratory studies, ordering and review of radiographic studies, pulse oximetry and re-evaluation of patient's  condition.  4:11 PM Triple lumen placed by Dr. Sedonia Small. See note for details.   Discussed patient with Dr. Waldron Labs who will see patient.    Final Clinical Impressions(s) / ED Diagnoses   Final diagnoses:  Diabetic ketoacidosis without coma associated with type 1 diabetes mellitus ([place])   Admit.   ED Discharge Orders    None       Carlisle Cater, Hershal Coria 02/13/19 1708    Maudie Flakes, MD 02/16/19 1059

## 2019-02-13 NOTE — ED Notes (Addendum)
EDP at bedside preparing to insert central line .

## 2019-02-13 NOTE — H&P (Signed)
TRH H&P   Patient Demographics:    [name], is a 53 y.o. female  MRN: [id number]   DOB - May 29, 1966  Admit Date - 02/13/2019  Outpatient Primary MD for the patient is [name], MD  Referring MD/NP/PA: PA Gieple  Patient coming from: Home  No chief complaint on file.     HPI:    [name]  is a 53 y.o. female, with past medical history significant for type 1 diabetes mellitus, on insulin, tardive dyskinesia secondary to Reglan, coronary artery disease status post CABG, depression, severe gastroparesis, on Mestinon , with multiple admissions in the past secondary to frequent episodes of DKA, patient presents to ED from her sister house secondary to nausea, vomiting, not able to relate any oral intake for last 2 days, her blood sugar was noted to be 505 on the scene, presentation patient was tachypneic, but afebrile, patient is compliant with her insulin regimen, as well with her diet, but previously met, no coffee-ground emesis, denies any chest pain, shortness of breath, fever or chills. -In ED patient had right IJ inserted given she was a difficult access, found to be in DKA with anion gap of 19, elevated creatinine 1.55, low bicarb of 14, she was started on insulin drip per DKA protocol and I was requested to admit.    Review of systems:    In addition to the HPI above,  No Fever-chills, No Headache, No changes with Vision or hearing, No problems swallowing food or Liquids, No Chest pain, Cough or Shortness of Breath, Does report abdominal pain, nausea and vomiting, no constipation or diarrhea No Blood in stool or Urine, No dysuria, No new skin rashes or bruises, No new joints pains-aches,  No new weakness, tingling, numbness in any extremity, No recent weight gain or loss, No polyuria, polydypsia or polyphagia, No significant Mental Stressors.  A full  10 point Review of Systems was done, except as stated above, all other Review of Systems were negative.   With Past History of the following :    Past Medical History:  Diagnosis Date  . AKI (acute kidney injury) ([place])   . Anemia, iron deficiency   . Anxiety and depression 05/18/2015  . CAD (coronary artery disease), native coronary artery 01/11/2018   Non-STEMI with severe three-vessel disease noted July 2019  . Depression   . Diabetes mellitus without complication ([place])   . Diabetic gastroparesis ([place])   . Diabetic neuropathy, type I diabetes mellitus ([place]) 05/18/2015  . DKA (diabetic ketoacidoses) ([place]) 06/16/2017  . DKA, type 1 ([place]) 03/18/2016  . Essential hypertension   . Gastroparesis   . GERD (gastroesophageal reflux disease)   . HLD (hyperlipidemia)   . MDD (major depressive disorder), single episode, severe , no psychosis ([place])   . Non-ST elevation (NSTEMI) myocardial infarction ([place])   . S/P CABG x 3 01/14/2018  . Tardive dyskinesia   .  Vitamin B12 deficiency 08/16/2015      Past Surgical History:  Procedure Laterality Date  . CARDIAC SURGERY    . COLONOSCOPY  09/27/2014   at [facility]  . CORONARY ARTERY BYPASS GRAFT N/A 01/14/2018   Procedure: CORONARY ARTERY BYPASS GRAFTING (CABG)X3, RIGHT AND LEFT SAPHENOUS VEIN HARVEST, MAMMARY TAKE DOWN. MAMMARY TO LAD, SVG TO PD, SVG TO DISTAL CIRC.;  Surgeon: Grace Isaac, MD;  Location: [place];  Service: Open Heart Surgery;  Laterality: N/A;  . ESOPHAGOGASTRODUODENOSCOPY  09/27/2014   at [facility], Dr Rolan Lipa. biospy neg for celiac, neg for H pylori.   . EYE SURGERY    . gailstones    . IR FLUORO GUIDE CV LINE RIGHT  02/01/2017  . IR FLUORO GUIDE CV LINE RIGHT  03/06/2017  . IR FLUORO GUIDE CV LINE RIGHT  03/25/2017  . IR GENERIC HISTORICAL  01/24/2016   IR FLUORO GUIDE CV LINE RIGHT 01/24/2016 Darrell K Allred, PA-C [facility]-INTER[facility] RAD  . IR GENERIC HISTORICAL  01/24/2016   IR US GUIDE VASC ACCESS RIGHT 01/24/2016 Darrell K Allred, PA-C  [facility]  . IR US GUIDE VASC ACCESS RIGHT  02/01/2017  . IR US GUIDE VASC ACCESS RIGHT  03/06/2017  . IR US GUIDE VASC ACCESS RIGHT  03/25/2017  . IR US GUIDE VASC ACCESS RIGHT  01/20/2019  . IR VENIPUNCTURE 3YRS/OLDER BY MD  01/20/2019  . LEFT HEART CATH AND CORONARY ANGIOGRAPHY N/A 01/07/2018   Procedure: LEFT HEART CATH AND CORONARY ANGIOGRAPHY;  Surgeon: Troy Sine, MD;  Location: [place] [facility];  Service: Cardiovascular;  Laterality: N/A;  . POSTERIOR VITRECTOMY AND MEMBRANE PEEL-LEFT EYE  09/28/2002  . POSTERIOR VITRECTOMY AND MEMBRANE PEEL-RIGHT EYE  03/16/2002  . RETINAL DETACHMENT SURGERY    . TEE WITHOUT CARDIOVERSION N/A 01/14/2018   Procedure: TRANSESOPHAGEAL ECHOCARDIOGRAM (TEE);  Surgeon: Grace Isaac, MD;  Location: [place];  Service: Open Heart Surgery;  Laterality: N/A;      Social History:     Social History   Tobacco Use  . Smoking status: Never Smoker  . Smokeless tobacco: Never Used  Substance Use Topics  . Alcohol use: No     Family History :     Family History  Problem Relation Age of Onset  . Cystic fibrosis Mother   . Hypertension Father   . Diabetes Brother   . Hypertension Maternal Grandmother      Home Medications:   Prior to Admission medications   Medication Sig Start Date End Date Taking? Authorizing Provider  ammonium lactate (AMLACTIN) 12 % cream Apply topically as needed for dry skin. 07/08/18  Yes Trula Slade, DPM  aspirin EC 81 MG tablet Take 1 tablet (81 mg total) by mouth daily. 06/03/18  Yes Nahser, Wonda Cheng, MD  Blood Glucose Monitoring Suppl (ONETOUCH VERIO) w/Device KIT Use as directed 03/21/18  Yes Newlin, [name], MD  busPIRone (BUSPAR) 10 MG tablet Take 1 tablet (10 mg total) by mouth 3 (three) times daily. 06/12/18  Yes [name], MD  docusate sodium (COLACE) 100 MG capsule Take 1 capsule (100 mg total) by mouth daily as needed for mild constipation. 02/03/18  Yes Burtis Junes, NP  glucose blood  (ACCU-CHEK AVIVA PLUS) test strip Use as instructed to test blood sugar 4 times daily Dx E10.65 12/18/18  Yes Elayne Snare, MD  insulin aspart (NOVOLOG) 100 UNIT/ML injection Inject 0-12 Units into the skin 3 (three) times daily before meals. As per sliding scale  10/14/18  Yes Elayne Snare, MD  Insulin Glargine (LANTUS SOLOSTAR) 100 UNIT/ML Solostar Pen Inject 15 units under the skin once daily in the morning. 10/14/18  Yes Elayne Snare, MD  Insulin Syringe-Needle U-100 (BD INSULIN SYRINGE ULTRAFINE) 31G X 5/16" 0.5 ML MISC 1 each by Does not apply route 4 (four) times daily. 04/28/18  Yes Elayne Snare, MD  Lancets [facility] DELICA PLUS [id number]) [place] 1 Units by Does not apply route as directed. 03/21/18  Yes [name], MD  loperamide (IMODIUM) 2 MG capsule Take 1 capsule (2 mg total) by mouth 3 (three) times daily as needed for diarrhea or loose stools. 12/25/18  Yes Aline August, MD  metoprolol tartrate (LOPRESSOR) 25 MG tablet Take 0.5 tablets (12.5 mg total) by mouth 2 (two) times daily. 10/08/18  Yes Nahser, Wonda Cheng, MD  pantoprazole (PROTONIX) 40 MG tablet Take 1 tablet (40 mg total) by mouth daily. 06/12/18  Yes [name], MD  pregabalin (LYRICA) 150 MG capsule Take 1 capsule (150 mg total) by mouth 2 (two) times daily. 01/02/19  Yes Elayne Snare, MD  promethazine (PHENERGAN) 12.5 MG tablet Take 1 tablet (12.5 mg total) by mouth every 8 (eight) hours as needed for up to 20 doses for nausea or vomiting. 01/22/19  Yes Hosie Poisson, MD  pyridostigmine (MESTINON) 60 MG tablet Take 60 mg by mouth 3 (three) times daily.   Yes [provider]  senna-docusate (SENOKOT-S) 8.6-50 MG tablet Take 1 tablet by mouth daily as needed for mild constipation. 01/28/19  Yes Ladell Pier, MD  ciprofloxacin (CIPRO) 500 MG tablet Take 1 tablet (500 mg total) by mouth 2 (two) times daily. Patient not taking: Reported on 02/13/2019 01/23/19   Hosie Poisson, MD     Allergies:     Allergies  Allergen  Reactions  . Anesthetics, Amide Nausea And Vomiting  . Penicillins Diarrhea, Nausea And Vomiting and Other (See Comments)    Has patient had a PCN reaction causing immediate rash, facial/tongue/throat swelling, SOB or lightheadedness with hypotension: Yes Has patient had a PCN reaction causing severe rash involving mucus membranes or skin necrosis: No Has patient had a PCN reaction that required hospitalization No Has patient had a PCN reaction occurring within the last 10 years: Yes  If all of the above answers are "NO", then may proceed with Cephalosporin use.   . Buprenorphine Hcl Rash  . Encainide Nausea And Vomiting  . Metoclopramide Other (See Comments)    Dystonia, muscle rigidity.  Patient states she is only allergic to the "pill form, not the IV"     Physical Exam:   Vitals  Blood pressure 132/60, pulse (!) 114, temperature 98.2 F (36.8 C), temperature source Oral, resp. rate 19, SpO2 99 %.   1. General developed female, laying in bed in no apparent distress  2. Normal affect and insight, Not Suicidal or Homicidal, Awake Alert, Oriented X 3.  3. No F.N deficits, ALL C.Nerves Intact, Strength 5/5 all 4 extremities, Sensation intact all 4 extremities, Plantars down going.  4. Ears and Eyes appear Normal, Conjunctivae clear, PERRLA. Moist Oral Mucosa.  5. Supple Neck, right IJ TLC, no JVD, No cervical lymphadenopathy appriciated, No Carotid Bruits.  6. Symmetrical Chest wall movement, Good air movement bilaterally, CTAB.  7. RRR, No Gallops, Rubs or Murmurs, No Parasternal Heave.  8. Positive Bowel Sounds, Abdomen Soft, No tenderness, No organomegaly appriciated,No rebound -guarding or rigidity.  9.  No Cyanosis, Normal Skin Turgor, No Skin Rash or Bruise.  10. Good muscle tone,  joints appear normal , no effusions, Normal ROM.  11. No Palpable Lymph Nodes in Neck or Axillae    Data Review:    CBC Recent Labs  Lab 02/13/19 1256 02/13/19 1425  WBC 10.4  --    HGB 11.0* 12.6  HCT 38.2 37.0  PLT 463*  --   MCV 86.0  --   MCH 24.8*  --   MCHC 28.8*  --   RDW 14.6  --   LYMPHSABS 0.7  --   MONOABS 0.2  --   EOSABS 0.0  --   BASOSABS 0.0  --    ------------------------------------------------------------------------------------------------------------------  Chemistries  Recent Labs  Lab 02/13/19 1256 02/13/19 1425  NA 142 142  K 3.9 3.9  CL 109  --   CO2 14*  --   GLUCOSE 493*  --   BUN 21*  --   CREATININE 1.55*  --   CALCIUM 9.8  --   AST 20  --   ALT 15  --   ALKPHOS 95  --   BILITOT 0.9  --    ------------------------------------------------------------------------------------------------------------------ CrCl cannot be calculated (Unknown ideal weight.). ------------------------------------------------------------------------------------------------------------------ No results for input(s): TSH, T4TOTAL, T3FREE, THYROIDAB in the last 72 hours.  Invalid input(s): FREET3  Coagulation profile No results for input(s): INR, PROTIME in the last 168 hours. ------------------------------------------------------------------------------------------------------------------- No results for input(s): DDIMER in the last 72 hours. -------------------------------------------------------------------------------------------------------------------  Cardiac Enzymes No results for input(s): CKMB, TROPONINI, MYOGLOBIN in the last 168 hours.  Invalid input(s): CK ------------------------------------------------------------------------------------------------------------------ No results found for: BNP   ---------------------------------------------------------------------------------------------------------------  Urinalysis    Component Value Date/Time   COLORURINE YELLOW 01/20/2019 0607   APPEARANCEUR HAZY (A) 01/20/2019 0607   LABSPEC 1.021 01/20/2019 0607   PHURINE 6.0 01/20/2019 0607   GLUCOSEU >=500 (A) 01/20/2019 0607    HGBUR SMALL (A) 01/20/2019 0607   BILIRUBINUR NEGATIVE 01/20/2019 0607   BILIRUBINUR negative 11/13/2018 1102   KETONESUR 80 (A) 01/20/2019 0607   PROTEINUR 100 (A) 01/20/2019 0607   UROBILINOGEN 0.2 11/13/2018 1102   UROBILINOGEN 0.2 04/27/2015 1608   NITRITE POSITIVE (A) 01/20/2019 0607   LEUKOCYTESUR TRACE (A) 01/20/2019 0607    ----------------------------------------------------------------------------------------------------------------   Imaging Results:    Dg Chest Portable 1 View  Result Date: 02/13/2019 CLINICAL DATA:  53 year old female status post right IJ central line placement. EXAM: PORTABLE CHEST 1 VIEW COMPARISON:  Prior chest x-ray 02/13/2019 FINDINGS: Interval placement of a right IJ approach central venous catheter. The catheter tip is in good position overlying the cavoatrial junction. Patient is status post median sternotomy with evidence of prior multivessel CABG. The lungs are clear. No evidence of new pleural effusion or pneumothorax. No acute osseous abnormality. IMPRESSION: New right IJ central venous catheter appears to be in good position with the tip overlying the cavoatrial junction. No evidence of pneumothorax or pleural effusion. Electronically Signed   By: Jacqulynn Cadet M.D.   On: 02/13/2019 16:56   Dg Chest Port 1 View  Result Date: 02/13/2019 CLINICAL DATA:  Shortness of breath EXAM: PORTABLE CHEST 1 VIEW COMPARISON:  12/22/2018 FINDINGS: Cardiac shadows within normal limits. Postsurgical changes are noted. Lungs are clear. No bony abnormality is noted. IMPRESSION: No acute abnormality seen Electronically Signed   By: Inez Catalina M.D.   On: 02/13/2019 13:24    My personal review of EKG: Rhythm NSR, Rate  466 /min, QTc 101 , no Acute ST changes   Assessment & Plan:    Active Problems:  Anxiety and depression   Anemia, iron deficiency   HLD (hyperlipidemia)   Essential hypertension   DKA (diabetic ketoacidoses) (HCC)   Dehydration    Diabetic ketoacidosis ([facility])   Diabetic ketoacidosis -With anion gap of 19, pH of 7.3, and low bicarb of 14 -She will be admitted to stepdown, on DKA protocol, continue with insulin drip, will give Lantus 15 units once as well.  Keep on IV fluids per DKA protocol, n.p.o. for now, once her anion gap will close will transition to Lantus 15 units and insulin sliding scale, if she can tolerate carb modified diet. -Continue with IV fluids  Tardive dyskinesia -Continue with home meds  History of diabetic gastroparesis -Continue with PRN nausea medication  State of CAD -Resume aspirin and beta-blockers  AKI  -Due to dehydration, creatinine 1.55, baseline around 1, continue with IV fluids  DVT Prophylaxis  Lovenox - SCDs   AM Labs Ordered, also please review Full Orders  Family Communication: Admission, patients condition and plan of care including tests being ordered have been discussed with the patient who indicate understanding and agree with the plan and Code Status.  Code Status Full  Likely DC to  Home  Condition GUARDED    Consults called:   Admission status:   Time spent in minutes :    Phillips Climes M.D on 02/13/2019 at 5:40 PM  Between 7am to 7pm - Pager - [phone]. After 7pm go to [email or web address] - password [facility]  [facility] - Office  [phone]

## 2019-02-13 NOTE — ED Notes (Signed)
ED TO INPATIENT HANDOFF REPORT  ED Nurse Name and Phone #: Anderson Malta RN [phone]  S Name/Age/Gender [name] 53 y.o. female Room/Bed: 017C/017C  Code Status   Code Status: Prior  Home/SNF/Other Home Patient oriented to: self, place, time and situation Is this baseline? Yes   Triage Complete: Triage complete  Chief Complaint Gastrpoaresis  Triage Note Pt was brought to ED with c/o n/v since yesterday (per pt), Hx of gastroparesis. EMS reports CBG of 505 on scene as well.   Allergies Allergies  Allergen Reactions  . Anesthetics, Amide Nausea And Vomiting  . Penicillins Diarrhea, Nausea And Vomiting and Other (See Comments)    Has patient had a PCN reaction causing immediate rash, facial/tongue/throat swelling, SOB or lightheadedness with hypotension: Yes Has patient had a PCN reaction causing severe rash involving mucus membranes or skin necrosis: No Has patient had a PCN reaction that required hospitalization No Has patient had a PCN reaction occurring within the last 10 years: Yes  If all of the above answers are "NO", then may proceed with Cephalosporin use.   . Buprenorphine Hcl Rash  . Encainide Nausea And Vomiting  . Metoclopramide Other (See Comments)    Dystonia, muscle rigidity.  Patient states she is only allergic to the "pill form, not the IV"    Level of Care/Admitting Diagnosis ED Disposition    ED Disposition Condition [place]: [place] [100100]  Level of Care: Progressive [102]  I expect the patient will be discharged within 24 hours: Yes  LOW acuity---Tx typically complete <24 hrs---ACUTE conditions typically can be evaluated <24 hours---LABS likely to return to acceptable levels <24 hours---IS near functional baseline---EXPECTED to return to current living arrangement---NOT newly hypoxic: Meets criteria for 5C-Observation unit  Covid Evaluation: Asymptomatic Screening Protocol (No Symptoms)  Diagnosis: DKA  (diabetic ketoacidoses) [id number]  Admitting Physician: Manfred Shirts  Attending Physician: Waldron Labs, DAWOOD S [[id number]  PT Class (Do Not Modify): Observation [104]  PT Acc Code (Do Not Modify): Observation [10022]       B Medical/Surgery History Past Medical History:  Diagnosis Date  . AKI (acute kidney injury) ([place])   . Anemia, iron deficiency   . Anxiety and depression 05/18/2015  . CAD (coronary artery disease), native coronary artery 01/11/2018   Non-STEMI with severe three-vessel disease noted July 2019  . Depression   . Diabetes mellitus without complication ([place])   . Diabetic gastroparesis ([place])   . Diabetic neuropathy, type I diabetes mellitus ([place]) 05/18/2015  . DKA (diabetic ketoacidoses) ([place]) 06/16/2017  . DKA, type 1 ([place]) 03/18/2016  . Essential hypertension   . Gastroparesis   . GERD (gastroesophageal reflux disease)   . HLD (hyperlipidemia)   . MDD (major depressive disorder), single episode, severe , no psychosis ([place])   . Non-ST elevation (NSTEMI) myocardial infarction ([place])   . S/P CABG x 3 01/14/2018  . Tardive dyskinesia   . Vitamin B12 deficiency 08/16/2015   Past Surgical History:  Procedure Laterality Date  . CARDIAC SURGERY    . COLONOSCOPY  09/27/2014   at [facility]  . CORONARY ARTERY BYPASS GRAFT N/A 01/14/2018   Procedure: CORONARY ARTERY BYPASS GRAFTING (CABG)X3, RIGHT AND LEFT SAPHENOUS VEIN HARVEST, MAMMARY TAKE DOWN. MAMMARY TO LAD, SVG TO PD, SVG TO DISTAL CIRC.;  Surgeon: Grace Isaac, MD;  Location: [place];  Service: Open Heart Surgery;  Laterality: N/A;  . ESOPHAGOGASTRODUODENOSCOPY  09/27/2014   at [facility], Dr Rolan Lipa.  biospy neg for celiac, neg for H pylori.   . EYE SURGERY    . gailstones    . IR FLUORO GUIDE CV LINE RIGHT  02/01/2017  . IR FLUORO GUIDE CV LINE RIGHT  03/06/2017  . IR FLUORO GUIDE CV LINE RIGHT  03/25/2017  . IR GENERIC HISTORICAL  01/24/2016   IR FLUORO GUIDE CV LINE RIGHT 01/24/2016 Darrell K Allred,  PA-C [facility]-INTERV RAD  . IR GENERIC HISTORICAL  01/24/2016   IR US GUIDE VASC ACCESS RIGHT 01/24/2016 Darrell K Allred, PA-C [facility]-INTER[facility]  . IR US GUIDE VASC ACCESS RIGHT  02/01/2017  . IR US GUIDE VASC ACCESS RIGHT  03/06/2017  . IR US GUIDE VASC ACCESS RIGHT  03/25/2017  . IR US GUIDE VASC ACCESS RIGHT  01/20/2019  . IR VENIPUNCTURE 54YRS/OLDER BY MD  01/20/2019  . LEFT HEART CATH AND CORONARY ANGIOGRAPHY N/A 01/07/2018   Procedure: LEFT HEART CATH AND CORONARY ANGIOGRAPHY;  Surgeon: Troy Sine, MD;  Location: [place] CV LAB;  Service: Cardiovascular;  Laterality: N/A;  . POSTERIOR VITRECTOMY AND MEMBRANE PEEL-LEFT EYE  09/28/2002  . POSTERIOR VITRECTOMY AND MEMBRANE PEEL-RIGHT EYE  03/16/2002  . RETINAL DETACHMENT SURGERY    . TEE WITHOUT CARDIOVERSION N/A 01/14/2018   Procedure: TRANSESOPHAGEAL ECHOCARDIOGRAM (TEE);  Surgeon: Grace Isaac, MD;  Location: [place];  Service: Open Heart Surgery;  Laterality: N/A;     A IV Location/Drains/Wounds Patient Lines/Drains/Airways Status   Active Line/Drains/Airways    Name:   Placement date:   Placement time:   Site:   Days:   CVC Triple Lumen 02/13/19 Right External jugular   02/13/19    1628     less than 1          Intake/Output Last 24 hours  Intake/Output Summary (Last 24 hours) at 02/13/2019 1902 Last data filed at 02/13/2019 1839 Gross per 24 hour  Intake 100 ml  Output -  Net 100 ml    Labs/Imaging Results for orders placed or performed during the hospital encounter of 02/13/19 (from the past 48 hour(s))  CBG monitoring, ED     Status: Abnormal   Collection Time: 02/13/19 12:43 PM  Result Value Ref Range   Glucose-Capillary 421 (H) 70 - 99 mg/dL   Comment 1 Notify RN    Comment 2 Document in Chart   CBC with Differential     Status: Abnormal   Collection Time: 02/13/19 12:56 PM  Result Value Ref Range   WBC 10.4 4.0 - 10.5 K/uL   RBC 4.44 3.87 - 5.11 MIL/uL   Hemoglobin 11.0 (L) 12.0 - 15.0 g/dL   HCT 38.2 36.0 - 46.0  %   MCV 86.0 80.0 - 100.0 fL   MCH 24.8 (L) 26.0 - 34.0 pg   MCHC 28.8 (L) 30.0 - 36.0 g/dL   RDW 14.6 11.5 - 15.5 %   Platelets 463 (H) 150 - 400 K/uL   nRBC 0.0 0.0 - 0.2 %   Neutrophils Relative % 92 %   Neutro Abs 9.4 (H) 1.7 - 7.7 K/uL   Lymphocytes Relative 6 %   Lymphs Abs 0.7 0.7 - 4.0 K/uL   Monocytes Relative 2 %   Monocytes Absolute 0.2 0.1 - 1.0 K/uL   Eosinophils Relative 0 %   Eosinophils Absolute 0.0 0.0 - 0.5 K/uL   Basophils Relative 0 %   Basophils Absolute 0.0 0.0 - 0.1 K/uL   Immature Granulocytes 0 %   Abs Immature Granulocytes 0.03  0.00 - 0.07 K/uL    Comment: Performed at [facility], [place] [address], [place], [place] [address]  Comprehensive metabolic panel     Status: Abnormal   Collection Time: 02/13/19 12:56 PM  Result Value Ref Range   Sodium 142 135 - 145 mmol/L   Potassium 3.9 3.5 - 5.1 mmol/L   Chloride 109 98 - 111 mmol/L   CO2 14 (L) 22 - 32 mmol/L   Glucose, Bld 493 (H) 70 - 99 mg/dL   BUN 21 (H) 6 - 20 mg/dL   Creatinine, Ser 1.55 (H) 0.44 - 1.00 mg/dL   Calcium 9.8 8.9 - 10.3 mg/dL   Total Protein 8.4 (H) 6.5 - 8.1 g/dL   Albumin 4.3 3.5 - 5.0 g/dL   AST 20 15 - 41 U/L   ALT 15 0 - 44 U/L   Alkaline Phosphatase 95 38 - 126 U/L   Total Bilirubin 0.9 0.3 - 1.2 mg/dL   GFR calc non Af Amer 38 (L) >60 mL/min   GFR calc Af Amer 44 (L) >60 mL/min   Anion gap 19 (H) 5 - 15    Comment: Performed at [facility] [address], [place], [place] [address]  Lactic acid, plasma     Status: Abnormal   Collection Time: 02/13/19  2:00 PM  Result Value Ref Range   Lactic Acid, Venous 3.8 (HH) 0.5 - 1.9 mmol/L    Comment: CRITICAL RESULT CALLED TO, READ BACK BY AND VERIFIED WITH: P.Treven Holtman,RN 1458 02/13/2019 CLARK,S Performed at [facility], [place] [address] [place], [place] [address]   POCT I-Stat EG7     Status: Abnormal   Collection Time: 02/13/19  2:25 PM  Result Value Ref Range   pH, Ven 7.300 7.250 - 7.430   pCO2, Ven  34.9 (L) 44.0 - 60.0 mmHg   pO2, Ven 73.0 (H) 32.0 - 45.0 mmHg   Bicarbonate 17.2 (L) 20.0 - 28.0 mmol/L   TCO2 18 (L) 22 - 32 mmol/L   O2 Saturation 93.0 %   Acid-base deficit 8.0 (H) 0.0 - 2.0 mmol/L   Sodium 142 135 - 145 mmol/L   Potassium 3.9 3.5 - 5.1 mmol/L   Calcium, Ion 1.20 1.15 - 1.40 mmol/L   HCT 37.0 36.0 - 46.0 %   Hemoglobin 12.6 12.0 - 15.0 g/dL   Patient temperature HIDE    Sample type VENOUS   CBG monitoring, ED     Status: Abnormal   Collection Time: 02/13/19  5:09 PM  Result Value Ref Range   Glucose-Capillary 387 (H) 70 - 99 mg/dL  SARS Coronavirus 2 [facility] order, Performed in [facility] hospital lab) Nasopharyngeal Nasopharyngeal Swab     Status: None   Collection Time: 02/13/19  5:41 PM   Specimen: Nasopharyngeal Swab  Result Value Ref Range   SARS Coronavirus 2 NEGATIVE NEGATIVE    Comment: (NOTE) If result is NEGATIVE SARS-CoV-2 target nucleic acids are NOT DETECTED. The SARS-CoV-2 RNA is generally detectable in upper and lower  respiratory specimens during the acute phase of infection. The lowest  concentration of SARS-CoV-2 viral copies this assay can detect is 250  copies / mL. A negative result does not preclude SARS-CoV-2 infection  and should not be used as the sole basis for treatment or other  patient management decisions.  A negative result may occur with  improper specimen collection / handling, submission of specimen other  than nasopharyngeal swab, presence of viral mutation(s) within  the  areas targeted by this assay, and inadequate number of viral copies  (<250 copies / mL). A negative result must be combined with clinical  observations, patient history, and epidemiological information. If result is POSITIVE SARS-CoV-2 target nucleic acids are DETECTED. The SARS-CoV-2 RNA is generally detectable in upper and lower  respiratory specimens dur ing the acute phase of infection.  Positive  results are indicative of active infection with  SARS-CoV-2.  Clinical  correlation with patient history and other diagnostic information is  necessary to determine patient infection status.  Positive results do  not rule out bacterial infection or co-infection with other viruses. If result is PRESUMPTIVE POSTIVE SARS-CoV-2 nucleic acids MAY BE PRESENT.   A presumptive positive result was obtained on the submitted specimen  and confirmed on repeat testing.  While 2019 novel coronavirus  (SARS-CoV-2) nucleic acids may be present in the submitted sample  additional confirmatory testing may be necessary for epidemiological  and / or clinical management purposes  to differentiate between  SARS-CoV-2 and other Sarbecovirus currently known to infect humans.  If clinically indicated additional testing with an alternate test  methodology [phone]) is advised. The SARS-CoV-2 RNA is generally  detectable in upper and lower respiratory sp ecimens during the acute  phase of infection. The expected result is Negative. Fact Sheet for Patients:  StrictlyIdeas.no Fact Sheet for Healthcare Providers: BankingDealers.co.za This test is not yet approved or cleared by the [place] FDA and has been authorized for detection and/or diagnosis of SARS-CoV-2 by FDA under an Emergency Use Authorization (EUA).  This EUA will remain in effect (meaning this test can be used) for the duration of the COVID-19 declaration under Section 564(b)(1) of the Act, 21 U.S.C. section 360bbb-3(b)(1), unless the authorization is terminated or revoked sooner. Performed at [facility], [place] [address], [place], [place] [address]   CBG monitoring, ED     Status: Abnormal   Collection Time: 02/13/19  6:31 PM  Result Value Ref Range   Glucose-Capillary 430 (H) 70 - 99 mg/dL   Comment 1 Notify RN    Comment 2 Document in Chart    Dg Chest Portable 1 View  Result Date: 02/13/2019 CLINICAL DATA:  53 year old female  status post right IJ central line placement. EXAM: PORTABLE CHEST 1 VIEW COMPARISON:  Prior chest x-ray 02/13/2019 FINDINGS: Interval placement of a right IJ approach central venous catheter. The catheter tip is in good position overlying the cavoatrial junction. Patient is status post median sternotomy with evidence of prior multivessel CABG. The lungs are clear. No evidence of new pleural effusion or pneumothorax. No acute osseous abnormality. IMPRESSION: New right IJ central venous catheter appears to be in good position with the tip overlying the cavoatrial junction. No evidence of pneumothorax or pleural effusion. Electronically Signed   By: Jacqulynn Cadet M.D.   On: 02/13/2019 16:56   Dg Chest Port 1 View  Result Date: 02/13/2019 CLINICAL DATA:  Shortness of breath EXAM: PORTABLE CHEST 1 VIEW COMPARISON:  12/22/2018 FINDINGS: Cardiac shadows within normal limits. Postsurgical changes are noted. Lungs are clear. No bony abnormality is noted. IMPRESSION: No acute abnormality seen Electronically Signed   By: Inez Catalina M.D.   On: 02/13/2019 13:24    Pending Labs Unresulted Labs (From admission, onward)    Start     Ordered   02/13/19 1709  Lactic acid, plasma  ONCE - STAT,   STAT     02/13/19 1709   02/13/19 1625  hCG, quantitative, pregnancy  Once,   STAT     02/13/19 1624   02/13/19 1256  Urinalysis, Routine w reflex microscopic  ONCE - STAT,   STAT     02/13/19 1256   02/13/19 1256  Urine culture  ONCE - STAT,   STAT     02/13/19 1256   Signed and Held  Basic metabolic panel  STAT Now then every 4 hours ,   STAT     Signed and Held          Vitals/Pain Today's Vitals   02/13/19 1430 02/13/19 1700 02/13/19 1707 02/13/19 1715  BP:  (!) 150/71 132/60   Pulse: 99 (!) 105 (!) 113 (!) 114  Resp:   13 19  Temp:      TempSrc:      SpO2: 99% 97% 99% 99%  PainSc:        Isolation Precautions No active isolations  Medications Medications  insulin regular, human (MYXREDLIN)  100 units/ 100 mL infusion (8.7 Units/hr Intravenous Rate/Dose Change 02/13/19 1839)  potassium chloride 10 mEq in 100 mL IVPB (10 mEq Intravenous Not Given 02/13/19 1840)  potassium chloride 10 mEq in 50 mL *CENTRAL LINE* IVPB (10 mEq Intravenous Bolus from Bag 02/13/19 1846)  sodium chloride 0.9 % bolus 1,000 mL (1,000 mLs Intravenous Bolus from Bag 02/13/19 1711)  prochlorperazine (COMPAZINE) injection 10 mg (10 mg Intramuscular Given 02/13/19 1448)  ondansetron (ZOFRAN) injection 4 mg (4 mg Intravenous Given 02/13/19 1631)  haloperidol lactate (HALDOL) injection 5 mg (5 mg Intramuscular Given 02/13/19 1532)  insulin glargine (LANTUS) injection 15 Units (15 Units Subcutaneous Given 02/13/19 1732)    Mobility walks with person assist Low fall risk   Focused Assessments Cardiac Assessment Handoff:  Cardiac Rhythm: Sinus tachycardia Lab Results  Component Value Date   CKTOTAL 82 03/24/2017   CKMB 2.5 03/31/2010   TROPONINI <0.03 12/15/2018   Lab Results  Component Value Date   DDIMER 0.64 (H) 04/03/2012   Does the Patient currently have chest pain? No     R Recommendations: See Admitting Provider Note  Report given to: 5W 18  Additional Notes: pt has tremors consistely since she came to ED, once IM Halodol given she has had no shakes noticed, A/Ox4, verbl- able to make needs known.

## 2019-02-13 NOTE — ED Notes (Addendum)
MD Bero looking for access via [place] at bedside.

## 2019-02-13 NOTE — ED Notes (Signed)
No IV access, IV team consult ordered.

## 2019-02-13 NOTE — ED Notes (Signed)
So far pt has only heaved with large amounts of saliva when she has had emesis, she does c/o stomach pain from the heaving.

## 2019-02-13 NOTE — ED Notes (Signed)
Pt sleeping, opened eyes to smile at RN, no other response.

## 2019-02-14 ENCOUNTER — Encounter (HOSPITAL_COMMUNITY): Payer: Self-pay

## 2019-02-14 DIAGNOSIS — I10 Essential (primary) hypertension: Secondary | ICD-10-CM

## 2019-02-14 DIAGNOSIS — E860 Dehydration: Secondary | ICD-10-CM | POA: Diagnosis not present

## 2019-02-14 DIAGNOSIS — E785 Hyperlipidemia, unspecified: Secondary | ICD-10-CM

## 2019-02-14 DIAGNOSIS — F329 Major depressive disorder, single episode, unspecified: Secondary | ICD-10-CM

## 2019-02-14 DIAGNOSIS — F419 Anxiety disorder, unspecified: Secondary | ICD-10-CM

## 2019-02-14 LAB — GLUCOSE, CAPILLARY
Glucose-Capillary: 105 mg/dL — ABNORMAL HIGH (ref 70–99)
Glucose-Capillary: 107 mg/dL — ABNORMAL HIGH (ref 70–99)
Glucose-Capillary: 115 mg/dL — ABNORMAL HIGH (ref 70–99)
Glucose-Capillary: 117 mg/dL — ABNORMAL HIGH (ref 70–99)
Glucose-Capillary: 120 mg/dL — ABNORMAL HIGH (ref 70–99)
Glucose-Capillary: 145 mg/dL — ABNORMAL HIGH (ref 70–99)
Glucose-Capillary: 166 mg/dL — ABNORMAL HIGH (ref 70–99)
Glucose-Capillary: 220 mg/dL — ABNORMAL HIGH (ref 70–99)
Glucose-Capillary: 88 mg/dL (ref 70–99)

## 2019-02-14 LAB — BASIC METABOLIC PANEL
Anion gap: 9 (ref 5–15)
Anion gap: 11 (ref 5–15)
Anion gap: 10 (ref 5–15)
BUN: 27 mg/dL — ABNORMAL HIGH (ref 6–20)
BUN: 27 mg/dL — ABNORMAL HIGH (ref 6–20)
BUN: 24 mg/dL — ABNORMAL HIGH (ref 6–20)
CO2: 20 mmol/L — ABNORMAL LOW (ref 22–32)
CO2: 18 mmol/L — ABNORMAL LOW (ref 22–32)
CO2: 21 mmol/L — ABNORMAL LOW (ref 22–32)
Calcium: 9.2 mg/dL (ref 8.9–10.3)
Calcium: 9.2 mg/dL (ref 8.9–10.3)
Calcium: 9 mg/dL (ref 8.9–10.3)
Chloride: 117 mmol/L — ABNORMAL HIGH (ref 98–111)
Chloride: 119 mmol/L — ABNORMAL HIGH (ref 98–111)
Chloride: 115 mmol/L — ABNORMAL HIGH (ref 98–111)
Creatinine, Ser: 1.53 mg/dL — ABNORMAL HIGH (ref 0.44–1.00)
Creatinine, Ser: 1.52 mg/dL — ABNORMAL HIGH (ref 0.44–1.00)
Creatinine, Ser: 1.39 mg/dL — ABNORMAL HIGH (ref 0.44–1.00)
GFR calc Af Amer: 45 mL/min — ABNORMAL LOW (ref >60)
GFR calc Af Amer: 46 mL/min — ABNORMAL LOW (ref >60)
GFR calc Af Amer: 51 mL/min — ABNORMAL LOW (ref >60)
GFR calc non Af Amer: 39 mL/min — ABNORMAL LOW (ref >60)
GFR calc non Af Amer: 39 mL/min — ABNORMAL LOW (ref >60)
GFR calc non Af Amer: 44 mL/min — ABNORMAL LOW (ref >60)
Glucose, Bld: 148 mg/dL — ABNORMAL HIGH (ref 70–99)
Glucose, Bld: 114 mg/dL — ABNORMAL HIGH (ref 70–99)
Glucose, Bld: 123 mg/dL — ABNORMAL HIGH (ref 70–99)
Potassium: 4.5 mmol/L (ref 3.5–5.1)
Potassium: 5.2 mmol/L — ABNORMAL HIGH (ref 3.5–5.1)
Potassium: 4 mmol/L (ref 3.5–5.1)
Sodium: 146 mmol/L — ABNORMAL HIGH (ref 135–145)
Sodium: 148 mmol/L — ABNORMAL HIGH (ref 135–145)
Sodium: 146 mmol/L — ABNORMAL HIGH (ref 135–145)

## 2019-02-14 MED ORDER — INSULIN ASPART 100 UNIT/ML ~~LOC~~ SOLN
0.0000 [IU] | SUBCUTANEOUS | Status: DC
  Administered 2019-02-14: 3 [IU] via SUBCUTANEOUS
  Administered 2019-02-14: 17:00:00 5 [IU] via SUBCUTANEOUS
  Administered 2019-02-15: 11 [IU] via SUBCUTANEOUS
  Administered 2019-02-15: 5 [IU] via SUBCUTANEOUS

## 2019-02-14 MED ORDER — INSULIN GLARGINE 100 UNIT/ML ~~LOC~~ SOLN
15.0000 [IU] | Freq: Once | SUBCUTANEOUS | Status: AC
  Administered 2019-02-14: 15 [IU] via SUBCUTANEOUS
  Filled 2019-02-14: qty 0.15

## 2019-02-14 MED ORDER — INSULIN GLARGINE 100 UNIT/ML ~~LOC~~ SOLN
15.0000 [IU] | Freq: Every day | SUBCUTANEOUS | Status: DC
  Filled 2019-02-14: qty 0.15

## 2019-02-14 NOTE — Progress Notes (Signed)
Pt present to the floor alert and oriented times 4. Pt was educated about the floor and the call bell system. Pt stated that she understood she needed to call before attempting to get out of bed or if she has any other needs. Pt has a surgical scar on her chest no other skin issues to report. Skin check was completed with the required 2 RN's

## 2019-02-14 NOTE — Care Management Obs Status (Signed)
[place] NOTIFICATION   Patient Details  Name: Lazette Lemley MRN: [id number] Date of Birth: [birth year]   Medicare Observation Status Notification Given:  Yes    Carles Collet, RN 02/14/2019, 3:32 PM

## 2019-02-14 NOTE — Progress Notes (Signed)
PROGRESS NOTE    [name]  BSJ:[id number] DOB: 1965/09/09 DOA: 02/13/2019 PCP: [name], MD   Brief Narrative: As per HPI 53 y.o. female, with past medical history significant for type 1 diabetes mellitus, on insulin, tardive dyskinesia secondary to Reglan, coronary artery disease status post CABG, depression, severe gastroparesis, on Mestinon , with multiple admissions in the past secondary to frequent episodes of DKA, patient presents to ED from her sister house secondary to nausea, vomiting, not able to relate any oral intake for last 2 days, her blood sugar was noted to be 505 on the scene, presentation patient was tachypneic, but afebrile, patient is compliant with her insulin regimen, as well with her diet, but previously met, no coffee-ground emesis, denies any chest pain, shortness of breath, fever or chills. -In ED patient had right IJ inserted given she was a difficult access, found to be in DKA with anion gap of 19, elevated creatinine 1.55, low bicarb of 14, she was started on insulin drip per DKA protocol.  Overnight Anion gap closed patient insulin discontinued and switched to subcu insulin. Nausea persistent.   Subjective: Still c/o nausea, feels little better. No abdominal pain, chest pain or shortness of breath. Passing gas. Has not eaten yet and not feeling like eating. She feels very elected to go home today due due to ongoing symptoms.  Assessment & Plan:  DKA: Resolved.Now on subcu insulin and lantus.Encourage oral intake-start on clear liquid liquid diet advance as tolerated. monitor glucose closely.  She reports she has had recurrent DKA.  Dehydration: In the setting of 1 continue on aggressive IV fluid hydration.  Patient at risk of further worsening of renal failure given poor oral intake sent continues to need IV fluid hydration.  Gastroparesis on mestinon: Still having persistent nausea symptoms.  Keep on aggressive symptomatic management, IV fluids.   Patient reports [name] has had recurrent issues with gastroparesis.  Anxiety and depression: stable  History of tardive dyskinesia: Monitor.  History of CAD no chest pain continue aspirin and beta-blocker.  Anemia, iron deficiency: hb stable. monitor  HLD/Essential hypertension: Pressure is stable.  Daily metoprolol.  Body mass index is 26.98 kg/m.   DVT prophylaxis: Heparin Code Status: FULL Family Communication: plan of care discussed with patient in detail.  Disposition Plan: Patient still poor oral intake, with AKI and at risk for further worsening AKI due to poor intake/nauseas, at risk for hypoglycemia due to poor intake. Continues to need iv fluid hydration. She will need at least 1 more midnight in hospital. Remains inpatient pending clinical improvement.  Consultants:  None  Procedures: None  Microbiology: none Antimicrobials: Anti-infectives (From admission, onward)   None     Objective: Vitals:   02/13/19 2349 02/14/19 0100 02/14/19 0349 02/14/19 0825  BP: (!) 146/56 (!) 113/39 (!) 124/48   Pulse: (!) 114 93 90   Resp: (!) 21 14 11    Temp: 99.4 F (37.4 C)  98.1 F (36.7 C) 98.2 F (36.8 C)  TempSrc: Oral  Oral Oral  SpO2: 100% 100% 100%   Weight:      Height:        Intake/Output Summary (Last 24 hours) at 02/14/2019 1017 Last data filed at 02/14/2019 0300 Gross per 24 hour  Intake 492 ml  Output -  Net 492 ml   Filed Weights   02/13/19 2005  Weight: 66.9 kg   Weight change:   Body mass index is 26.98 kg/m.  Intake/Output from previous day: 08/21 0701 -  08/22 0700 In: 492 [I.V.:392; IV Piggyback:100] Out: -  Intake/Output this shift: No intake/output data recorded.  Examination:  General exam: Appears calm and nauseated HEENT:PERRL,Oral mucosa moist, Ear/Nose normal on gross exam Respiratory system: Bilateral equal air entry, normal vesicular breath sounds, no wheezes or crackles  Cardiovascular system: S1 & S2 heard,No JVD, murmurs.  Gastrointestinal system: Abdomen is  Soft. Bloated, mildly tender epigastrium, BS +  Nervous System:Alert and oriented. No focal neurological deficits/moving extremities, sensation intact. Extremities: No edema, no clubbing, distal peripheral pulses palpable. Skin: No rashes, lesions, no icterus MSK: Normal muscle bulk,tone ,power  Medications:  Scheduled Meds: . aspirin EC  81 mg Oral Daily  . busPIRone  10 mg Oral TID  . heparin  5,000 Units Subcutaneous Q8H  . insulin aspart  0-15 Units Subcutaneous Q4H  . [START ON 02/15/2019] insulin glargine  15 Units Subcutaneous Daily  . metoprolol tartrate  12.5 mg Oral BID  . pantoprazole  40 mg Oral Daily  . pregabalin  150 mg Oral BID  . pyridostigmine  60 mg Oral TID   Continuous Infusions: . sodium chloride Stopped (02/13/19 2211)    Data Reviewed: I have personally reviewed following labs and imaging studies  CBC: Recent Labs  Lab 02/13/19 1256 02/13/19 1425  WBC 10.4  --   NEUTROABS 9.4*  --   HGB 11.0* 12.6  HCT 38.2 37.0  MCV 86.0  --   PLT 463*  --    Basic Metabolic Panel: Recent Labs  Lab 02/13/19 1256 02/13/19 1425 02/13/19 2042 02/14/19 0047 02/14/19 0355 02/14/19 0802  NA 142 142 147* 146* 148* 146*  K 3.9 3.9 4.5 4.5 5.2* 4.0  CL 109  --  118* 117* 119* 115*  CO2 14*  --  16* 20* 18* 21*  GLUCOSE 493*  --  268* 148* 114* 123*  BUN 21*  --  29* 27* 27* 24*  CREATININE 1.55*  --  1.67* 1.53* 1.52* 1.39*  CALCIUM 9.8  --  9.6 9.2 9.2 9.0   GFR: Estimated Creatinine Clearance: 42.9 mL/min (A) (by C-G formula based on SCr of 1.39 mg/dL (H)). Liver Function Tests: Recent Labs  Lab 02/13/19 1256  AST 20  ALT 15  ALKPHOS 95  BILITOT 0.9  PROT 8.4*  ALBUMIN 4.3   No results for input(s): LIPASE, AMYLASE in the last 168 hours. No results for input(s): AMMONIA in the last 168 hours. Coagulation Profile: No results for input(s): INR, PROTIME in the last 168 hours. Cardiac Enzymes: No results for  input(s): CKTOTAL, CKMB, CKMBINDEX, TROPONINI in the last 168 hours. BNP (last 3 results) No results for input(s): PROBNP in the last 8760 hours. HbA1C: No results for input(s): HGBA1C in the last 72 hours. CBG: Recent Labs  Lab 02/14/19 0159 02/14/19 0331 02/14/19 0428 02/14/19 0544 02/14/19 0758  GLUCAP 120* 115* 107* 117* 105*   Lipid Profile: No results for input(s): CHOL, HDL, LDLCALC, TRIG, CHOLHDL, LDLDIRECT in the last 72 hours. Thyroid Function Tests: No results for input(s): TSH, T4TOTAL, FREET4, T3FREE, THYROIDAB in the last 72 hours. Anemia Panel: No results for input(s): VITAMINB12, FOLATE, FERRITIN, TIBC, IRON, RETICCTPCT in the last 72 hours. Sepsis Labs: Recent Labs  Lab 02/13/19 1400 02/13/19 1709  LATICACIDVEN 3.8* 3.1*    Recent Results (from the past 240 hour(s))  SARS Coronavirus 2 [facility] order, Performed in [facility] hospital lab) Nasopharyngeal Nasopharyngeal Swab     Status: None   Collection Time: 02/13/19  5:41 PM  Specimen: Nasopharyngeal Swab  Result Value Ref Range Status   SARS Coronavirus 2 NEGATIVE NEGATIVE Final    Comment: (NOTE) If result is NEGATIVE SARS-CoV-2 target nucleic acids are NOT DETECTED. The SARS-CoV-2 RNA is generally detectable in upper and lower  respiratory specimens during the acute phase of infection. The lowest  concentration of SARS-CoV-2 viral copies this assay can detect is 250  copies / mL. A negative result does not preclude SARS-CoV-2 infection  and should not be used as the sole basis for treatment or other  patient management decisions.  A negative result may occur with  improper specimen collection / handling, submission of specimen other  than nasopharyngeal swab, presence of viral mutation(s) within the  areas targeted by this assay, and inadequate number of viral copies  (<250 copies / mL). A negative result must be combined with clinical  observations, patient history, and epidemiological  information. If result is POSITIVE SARS-CoV-2 target nucleic acids are DETECTED. The SARS-CoV-2 RNA is generally detectable in upper and lower  respiratory specimens dur ing the acute phase of infection.  Positive  results are indicative of active infection with SARS-CoV-2.  Clinical  correlation with patient history and other diagnostic information is  necessary to determine patient infection status.  Positive results do  not rule out bacterial infection or co-infection with other viruses. If result is PRESUMPTIVE POSTIVE SARS-CoV-2 nucleic acids MAY BE PRESENT.   A presumptive positive result was obtained on the submitted specimen  and confirmed on repeat testing.  While 2019 novel coronavirus  (SARS-CoV-2) nucleic acids may be present in the submitted sample  additional confirmatory testing may be necessary for epidemiological  and / or clinical management purposes  to differentiate between  SARS-CoV-2 and other Sarbecovirus currently known to infect humans.  If clinically indicated additional testing with an alternate test  methodology [phone]) is advised. The SARS-CoV-2 RNA is generally  detectable in upper and lower respiratory sp ecimens during the acute  phase of infection. The expected result is Negative. Fact Sheet for Patients:  StrictlyIdeas.no Fact Sheet for Healthcare Providers: BankingDealers.co.za This test is not yet approved or cleared by the [place] FDA and has been authorized for detection and/or diagnosis of SARS-CoV-2 by FDA under an Emergency Use Authorization (EUA).  This EUA will remain in effect (meaning this test can be used) for the duration of the COVID-19 declaration under Section 564(b)(1) of the Act, 21 U.S.C. section 360bbb-3(b)(1), unless the authorization is terminated or revoked sooner. Performed at [facility], [place] [address] [place], [place] [address]       Radiology Studies: Dg  Chest Portable 1 View  Result Date: 02/13/2019 CLINICAL DATA:  53 year old female status post right IJ central line placement. EXAM: PORTABLE CHEST 1 VIEW COMPARISON:  Prior chest x-ray 02/13/2019 FINDINGS: Interval placement of a right IJ approach central venous catheter. The catheter tip is in good position overlying the cavoatrial junction. Patient is status post median sternotomy with evidence of prior multivessel CABG. The lungs are clear. No evidence of new pleural effusion or pneumothorax. No acute osseous abnormality. IMPRESSION: New right IJ central venous catheter appears to be in good position with the tip overlying the cavoatrial junction. No evidence of pneumothorax or pleural effusion. Electronically Signed   By: Jacqulynn Cadet M.D.   On: 02/13/2019 16:56   Dg Chest Port 1 View  Result Date: 02/13/2019 CLINICAL DATA:  Shortness of breath EXAM: PORTABLE CHEST 1 VIEW COMPARISON:  12/22/2018 FINDINGS: Cardiac  shadows within normal limits. Postsurgical changes are noted. Lungs are clear. No bony abnormality is noted. IMPRESSION: No acute abnormality seen Electronically Signed   By: Inez Catalina M.D.   On: 02/13/2019 13:24      LOS: 0 days   Time spent: More than 50% of that time was spent in counseling and/or coordination of care.  Antonieta Pert, MD [facility]  02/14/2019, 10:17 AM

## 2019-02-15 ENCOUNTER — Encounter (HOSPITAL_COMMUNITY): Payer: Self-pay | Admitting: *Deleted

## 2019-02-15 DIAGNOSIS — E860 Dehydration: Secondary | ICD-10-CM | POA: Diagnosis not present

## 2019-02-15 LAB — GLUCOSE, CAPILLARY
Glucose-Capillary: 33 mg/dL — CL (ref 70–99)
Glucose-Capillary: 69 mg/dL — ABNORMAL LOW (ref 70–99)
Glucose-Capillary: 148 mg/dL — ABNORMAL HIGH (ref 70–99)
Glucose-Capillary: 221 mg/dL — ABNORMAL HIGH (ref 70–99)
Glucose-Capillary: 116 mg/dL — ABNORMAL HIGH (ref 70–99)
Glucose-Capillary: 97 mg/dL (ref 70–99)
Glucose-Capillary: 303 mg/dL — ABNORMAL HIGH (ref 70–99)
Glucose-Capillary: 278 mg/dL — ABNORMAL HIGH (ref 70–99)

## 2019-02-15 LAB — BASIC METABOLIC PANEL
Anion gap: 5 (ref 5–15)
BUN: 16 mg/dL (ref 6–20)
CO2: 20 mmol/L — ABNORMAL LOW (ref 22–32)
Calcium: 8.2 mg/dL — ABNORMAL LOW (ref 8.9–10.3)
Chloride: 114 mmol/L — ABNORMAL HIGH (ref 98–111)
Creatinine, Ser: 1.19 mg/dL — ABNORMAL HIGH (ref 0.44–1.00)
GFR calc Af Amer: >60 mL/min (ref >60)
GFR calc non Af Amer: 53 mL/min — ABNORMAL LOW (ref >60)
Glucose, Bld: 253 mg/dL — ABNORMAL HIGH (ref 70–99)
Potassium: 3.6 mmol/L (ref 3.5–5.1)
Sodium: 139 mmol/L (ref 135–145)

## 2019-02-15 LAB — URINE CULTURE

## 2019-02-15 MED ORDER — INSULIN GLARGINE 100 UNIT/ML ~~LOC~~ SOLN
5.0000 [IU] | Freq: Every day | SUBCUTANEOUS | Status: DC
  Administered 2019-02-15: 5 [IU] via SUBCUTANEOUS
  Filled 2019-02-15: qty 0.05

## 2019-02-15 MED ORDER — INSULIN GLARGINE 100 UNIT/ML ~~LOC~~ SOLN: 5.0000 [IU] | Freq: Every day | SUBCUTANEOUS | Status: DC

## 2019-02-15 MED ORDER — SODIUM CHLORIDE 0.9% FLUSH: 10.0000 mL | INTRAVENOUS | Status: DC | PRN

## 2019-02-15 MED ORDER — ONDANSETRON HCL 4 MG/2ML IJ SOLN
4.0000 mg | Freq: Four times a day (QID) | INTRAMUSCULAR | Status: DC | PRN
  Administered 2019-02-15: 4 mg via INTRAVENOUS
  Filled 2019-02-15: qty 2

## 2019-02-15 MED ORDER — LOPERAMIDE HCL 2 MG PO CAPS
2.0000 mg | ORAL_CAPSULE | ORAL | Status: DC | PRN
  Administered 2019-02-15 (×2): 2 mg via ORAL
  Filled 2019-02-15 (×2): qty 1

## 2019-02-15 NOTE — Progress Notes (Signed)
Nsg Discharge Note  Admit Date:  02/13/2019 Discharge date: 02/15/2019   [name] to be D/C'd home per MD order.  Patient/caregiver able to verbalize understanding.  Discharge Medication: Allergies as of 02/15/2019      Reactions   Anesthetics, Amide Nausea And Vomiting   Penicillins Diarrhea, Nausea And Vomiting, Other (See Comments)   Has patient had a PCN reaction causing immediate rash, facial/tongue/throat swelling, SOB or lightheadedness with hypotension: Yes Has patient had a PCN reaction causing severe rash involving mucus membranes or skin necrosis: No Has patient had a PCN reaction that required hospitalization No Has patient had a PCN reaction occurring within the last 10 years: Yes  If all of the above answers are "NO", then may proceed with Cephalosporin use.   Buprenorphine Hcl Rash   Encainide Nausea And Vomiting   Metoclopramide Other (See Comments)   Dystonia, muscle rigidity.  Patient states she is only allergic to the "pill form, not the IV"      Medication List    STOP taking these medications   ciprofloxacin 500 MG tablet Commonly known as: Cipro     TAKE these medications   Accu-Chek Aviva Plus test strip Generic drug: glucose blood Use as instructed to test blood sugar 4 times daily Dx E10.65 Notes to patient: Due 8/23   ammonium lactate 12 % cream Commonly known as: AMLACTIN Apply topically as needed for dry skin.   aspirin EC 81 MG tablet Take 1 tablet (81 mg total) by mouth daily. Notes to patient: Due 8/24   busPIRone 10 MG tablet Commonly known as: BUSPAR Take 1 tablet (10 mg total) by mouth 3 (three) times daily. Notes to patient: Due 8/23   docusate sodium 100 MG capsule Commonly known as: Colace Take 1 capsule (100 mg total) by mouth daily as needed for mild constipation.   insulin aspart 100 UNIT/ML injection Commonly known as: novoLOG Inject 0-12 Units into the skin 3 (three) times daily before meals. As per sliding  scale Notes to patient: Due 8/23   Insulin Glargine 100 UNIT/ML Solostar Pen Commonly known as: Lantus SoloStar Inject 15 units under the skin once daily in the morning. Notes to patient: Due 8/24   Insulin Syringe-Needle U-100 31G X 5/16" 0.5 ML Misc Commonly known as: BD Insulin Syringe Ultrafine 1 each by Does not apply route 4 (four) times daily.   loperamide 2 MG capsule Commonly known as: IMODIUM Take 1 capsule (2 mg total) by mouth 3 (three) times daily as needed for diarrhea or loose stools.   metoprolol tartrate 25 MG tablet Commonly known as: LOPRESSOR Take 0.5 tablets (12.5 mg total) by mouth 2 (two) times daily. Notes to patient: Due 3/24   OneTouch Delica Plus [id number] Misc 1 Units by Does not apply route as directed.   OneTouch Verio w/Device Kit Use as directed   pantoprazole 40 MG tablet Commonly known as: PROTONIX Take 1 tablet (40 mg total) by mouth daily. Notes to patient: Due 8/24   pregabalin 150 MG capsule Commonly known as: Lyrica Take 1 capsule (150 mg total) by mouth 2 (two) times daily. Notes to patient: Due 8/23   promethazine 12.5 MG tablet Commonly known as: PHENERGAN Take 1 tablet (12.5 mg total) by mouth every 8 (eight) hours as needed for up to 20 doses for nausea or vomiting.   pyridostigmine 60 MG tablet Commonly known as: MESTINON Take 60 mg by mouth 3 (three) times daily. Notes to patient: Due 8/23  senna-docusate 8.6-50 MG tablet Commonly known as: Senokot-S Take 1 tablet by mouth daily as needed for mild constipation.       Discharge Assessment: Vitals:   02/14/19 2120 02/15/19 0551  BP: 140/64 (!) 113/56  Pulse: 82 68  Resp: 16 19  Temp: 97.7 F (36.5 C) 97.8 F (36.6 C)  SpO2: 100% 100%   Skin clean, dry and intact without evidence of skin break down, no evidence of skin tears noted. IV catheter discontinued intact. Site without signs and symptoms of complications - no redness or edema noted at insertion site,  patient denies c/o pain - only slight tenderness at site.  Dressing with slight pressure applied.  D/c Instructions-Education: Discharge instructions given to patient/family with verbalized understanding. D/c education completed with patient/family including follow up instructions, medication list, d/c activities limitations if indicated, with other d/c instructions as indicated by MD - patient able to verbalize understanding, all questions fully answered. Patient instructed to return to ED, call 911, or call MD for any changes in condition.  Patient escorted via [facility], and D/C home via privately paid taxi  Markela Wee, Jolene Schimke, RN 02/15/2019 7:29 PM

## 2019-02-15 NOTE — Discharge Instructions (Signed)
Diabetic Ketoacidosis °Diabetic ketoacidosis is a serious complication of diabetes. This condition develops when there is not enough insulin in the body. Insulin is an hormone that regulates blood sugar levels in the body. Normally, insulin allows glucose to enter the cells in the body. The cells break down glucose for energy. Without enough insulin, the body cannot break down glucose, so it breaks down fats instead. This leads to high blood glucose levels in the body and the production of acids that are called ketones. Ketones are poisonous at high levels. °If diabetic ketoacidosis is not treated, it can cause severe dehydration and can lead to a coma or death. °What are the causes? °This condition develops when a lack of insulin causes the body to break down fats instead of glucose. This may be triggered by: °· Stress on the body. This stress is brought on by an illness. °· Infection. °· Medicines that raise blood glucose levels. °· Not taking diabetes medicine. °· New onset of type 1 diabetes mellitus. °What are the signs or symptoms? °Symptoms of this condition include: °· Fatigue. °· Weight loss. °· Excessive thirst. °· Light-headedness. °· Fruity or sweet-smelling breath. °· Excessive urination. °· Vision changes. °· Confusion or irritability. °· Nausea. °· Vomiting. °· Rapid breathing. °· Abdominal pain. °· Feeling flushed. °How is this diagnosed? °This condition is diagnosed based on your medical history, a physical exam, and blood tests. You may also have a urine test to check for ketones. °How is this treated? °This condition may be treated with: °· Fluid replacement. This may be done to correct dehydration. °· Insulin injections. These may be given through the skin or through an IV tube. °· Electrolyte replacement. Electrolytes are minerals in your blood. Electrolytes such as potassium and sodium may be given in pill form or through an IV tube. °· Antibiotic medicines. These may be prescribed if your  condition was caused by an infection. °Diabetic ketoacidosis is a serious medical condition. You may need emergency treatment in the hospital to monitor your condition. °Follow these instructions at home: °Eating and drinking °· Drink enough fluids to keep your urine clear or pale yellow. °· If you are not able to eat, drink clear fluids in small amounts as you are able. Clear fluids include water, ice chips, fruit juice with water added (diluted), and low-calorie sports drinks. You may also have sugar-free jello or popsicles. °· If you are able to eat, follow your usual diet and drink sugar-free liquids, such as water. °Medicines °· Take over-the-counter and prescription medicines only as told by your health care provider. °· Continue to take insulin and other diabetes medicines as told by your health care provider. °· If you were prescribed an antibiotic, take it as told by your health care provider. Do not stop taking the antibiotic even if you start to feel better. °General instructions ° °· Check your urine for ketones when you are ill and as told by your health care provider. °? If your blood glucose is 240 mg/dL (13.3 mmol/L) or higher, check your urine ketones every 4-6 hours. °· Check your blood glucose every day, as often as told by your health care provider. °? If your blood glucose is high, drink plenty of fluids. This helps to flush out ketones. °? If your blood glucose is above your target for 2 tests in a row, contact your health care provider. °· Carry a medical alert card or wear medical alert jewelry that says that you have diabetes. °· Rest   and exercise only as told by your health care provider. Do not exercise when your blood glucose is high and you have ketones in your urine. °· If you get sick, call your health care provider and begin treatment quickly. Your body often needs extra insulin to fight an illness. Check your blood glucose every 4-6 hours when you are sick. °· Keep all follow-up  visits as told by your health care provider. This is important. °Contact a health care provider if: °· Your blood glucose level is higher than 240 mg/dL (13.3 mmol/L) for 2 days in a row. °· You have moderate or large ketones in your urine. °· You have a fever. °· You cannot eat or drink without vomiting. °· You have been vomiting for more than 2 hours. °· You continue to have symptoms of diabetic ketoacidosis. °· You develop new symptoms. °Get help right away if: °· Your blood glucose monitor reads “high” even when you are taking insulin. °· You faint. °· You have chest pain. °· You have trouble breathing. °· You have sudden trouble speaking or swallowing. °· You have vomiting or diarrhea that gets worse after 3 hours. °· You are unable to stay awake. °· You have trouble thinking. °· You are severely dehydrated. Symptoms of severe dehydration include: °? Extreme thirst. °? Dry mouth. °? Rapid breathing. °These symptoms may represent a serious problem that is an emergency. Do not wait to see if the symptoms will go away. Get medical help right away. Call your local emergency services (911 in the [place] Do not drive yourself to the hospital. °Summary °· Diabetic ketoacidosis is a serious complication of diabetes. This condition develops when there is not enough insulin in the body. °· This condition is diagnosed based on your medical history, a physical exam, and blood tests. You may also have a urine test to check for ketones. °· Diabetic ketoacidosis is a serious medical condition. You may need emergency treatment in the hospital to monitor your condition. °· Contact your health care provider if your blood glucose is higher than 240 mg/dl for 2 days in a row or if you have moderate or large ketones in your urine. °This information is not intended to replace advice given to you by your health care provider. Make sure you discuss any questions you have with your health care provider. °Document Released: 06/08/2000  Document Revised: 07/27/2016 Document Reviewed: 07/16/2016 °Elsevier Patient Education © 2020 [employer] ° °

## 2019-02-15 NOTE — Discharge Summary (Signed)
Physician Discharge Summary  [name] MRN:[id number] DOB: 01/12/1966 DOA: 02/13/2019  PCP: Newlin, Enobong, MD  Admit date: 02/13/2019 Discharge date: 02/15/2019  Admitted From: Home Disposition: Home  Recommendations for Outpatient Follow-up:  1. Follow up with PCP in 1-2 weeks 2. Please obtain accuchecjk 4 times a day at home and PRN for symptoms of hypoglycemia.   3. BMP/CBC in one week 4. Please follow up on the following pending results:  Home Health: no Equipment/Devices:none  Discharge Condition: Stable CODE STATUS: FULL Diet recommendation:Diabetic  Brief/Interim Summary:  53 y.o. female, with past medical history significant for type 1 diabetes mellitus, on insulin, tardive dyskinesia secondary to Reglan, coronary artery disease status post CABG, depression, severe gastroparesis, on Mestinon , with multiple admissions in the past secondary to frequent episodes of DKA, patient presents to ED from her sister house secondary to nausea, vomiting, not able to relate any oral intake for last 2 days, her blood sugar was noted to be 505 on the scene, presentation patient was tachypneic, but afebrile, patient is compliant with her insulin regimen, as well with her diet, but previously met, no coffee-ground emesis, denies any chest pain, shortness of breath, fever or chills. -In ED patient had right IJ inserted given she was a difficult access, found to be in DKA with anion gap of 19, elevated creatinine 1.55, low bicarb of 14, she was started on insulin drip per DKA protocol. 8/22: Anion gap closed patient insulin discontinued and switched to subcu insulin. Nausea persistent and continues to be hospitalized. 8/22 overnight hypoglycemic in 30s due to poor po intake-since now having good oral intake and tolerating.  Finished her full breakfast and for lunch blood sugar has stabilized and well controlled.. Patient is now tolerating diet nausea has improved.  Blood sugar  stabilized. Patient feels well and ready for home today.    Discharge diagnoses:  DKA/diabetes mellitus on long-term insulin with complication: DKA Resolved.She is takes NovoLog sliding scale and Lantus at home.  She advised to continue on lower insulin based on her sugar which she is very aware.  Got lantus 5 units today. She will be monitored till this evening and if sugar remains stable and no more hypoglycemia  And if no more uncontrolled hyperglycemia she will be discharged home.  She has finished her full breakfast and for lunch and blood sugar has remained stabilized she is going to check her sugar 4 times a day at home and PRN basis. We discussed abt hypoglycemia nad hyperglycemia and how to manage and to check sugar at home.  Dehydration: In the setting of 1 improved with IV .hydration.  Now tolerating p.o.    Gastroparesis on mestinon: Overall improved.  Having diarrhea after eating diet.but resolved  Anxiety and depression: stable  History of tardive dyskinesia: Monitor.  History of CAD no chest pain continue aspirin and beta-blocker.  Anemia, iron deficiency: hb stable. monitor  HLD/Essential hypertension: Pressure is stable.  cont metoprolol.  Body mass index is 26.98 kg/m.   DVT prophylaxis: Heparin Code Status: FULL Family Communication: plan of care discussed with patient in detail.  She would like to go home today. Disposition Plan:Home today,  Consultants:  None  Procedures: None  Microbiology: none   Discharge Diagnoses:  Active Problems:   Anxiety and depression   Anemia, iron deficiency   HLD (hyperlipidemia)   Essential hypertension   DKA (diabetic ketoacidoses) (HCC)   Dehydration   Diabetic ketoacidosis (HCC)    Discharge Instructions  Discharge Instructions      Diet Carb Modified   Complete by: As directed    Discharge instructions   Complete by: As directed    Check blood sugar 3 times a day and bedtime at home. If blood sugar  running above 200 less than 70 please call your MD to adjust insulin. If blood sugars running less 100 do not use insulin and call MD. If you noticed signs and symptoms of hypoglycemia or low blood sugar like jitteriness, confusion, thirst, tremor, sweating- Check blood sugar, drink sugary drink/biscuits/sweets to increase sugar level and call MD or return to ER.   Increase activity slowly   Complete by: As directed      Allergies as of 02/15/2019      Reactions   Anesthetics, Amide Nausea And Vomiting   Penicillins Diarrhea, Nausea And Vomiting, Other (See Comments)   Has patient had a PCN reaction causing immediate rash, facial/tongue/throat swelling, SOB or lightheadedness with hypotension: Yes Has patient had a PCN reaction causing severe rash involving mucus membranes or skin necrosis: No Has patient had a PCN reaction that required hospitalization No Has patient had a PCN reaction occurring within the last 10 years: Yes  If all of the above answers are "NO", then may proceed with Cephalosporin use.   Buprenorphine Hcl Rash   Encainide Nausea And Vomiting   Metoclopramide Other (See Comments)   Dystonia, muscle rigidity.  Patient states she is only allergic to the "pill form, not the IV"      Medication List    STOP taking these medications   ciprofloxacin 500 MG tablet Commonly known as: Cipro     TAKE these medications   Accu-Chek Aviva Plus test strip Generic drug: glucose blood Use as instructed to test blood sugar 4 times daily Dx E10.65   ammonium lactate 12 % cream Commonly known as: AMLACTIN Apply topically as needed for dry skin.   aspirin EC 81 MG tablet Take 1 tablet (81 mg total) by mouth daily.   busPIRone 10 MG tablet Commonly known as: BUSPAR Take 1 tablet (10 mg total) by mouth 3 (three) times daily.   docusate sodium 100 MG capsule Commonly known as: Colace Take 1 capsule (100 mg total) by mouth daily as needed for mild constipation.   insulin  aspart 100 UNIT/ML injection Commonly known as: novoLOG Inject 0-12 Units into the skin 3 (three) times daily before meals. As per sliding scale   Insulin Glargine 100 UNIT/ML Solostar Pen Commonly known as: Lantus SoloStar Inject 15 units under the skin once daily in the morning.   Insulin Syringe-Needle U-100 31G X 5/16" 0.5 ML Misc Commonly known as: BD Insulin Syringe Ultrafine 1 each by Does not apply route 4 (four) times daily.   loperamide 2 MG capsule Commonly known as: IMODIUM Take 1 capsule (2 mg total) by mouth 3 (three) times daily as needed for diarrhea or loose stools.   metoprolol tartrate 25 MG tablet Commonly known as: LOPRESSOR Take 0.5 tablets (12.5 mg total) by mouth 2 (two) times daily.   OneTouch Delica Plus Lancet33G Misc 1 Units by Does not apply route as directed.   OneTouch Verio w/Device Kit Use as directed   pantoprazole 40 MG tablet Commonly known as: PROTONIX Take 1 tablet (40 mg total) by mouth daily.   pregabalin 150 MG capsule Commonly known as: Lyrica Take 1 capsule (150 mg total) by mouth 2 (two) times daily.   promethazine 12.5 MG tablet Commonly known as: PHENERGAN Take 1 tablet (12.5 mg   total) by mouth every 8 (eight) hours as needed for up to 20 doses for nausea or vomiting.   pyridostigmine 60 MG tablet Commonly known as: MESTINON Take 60 mg by mouth 3 (three) times daily.   senna-docusate 8.6-50 MG tablet Commonly known as: Senokot-S Take 1 tablet by mouth daily as needed for mild constipation.       Allergies  Allergen Reactions  . Anesthetics, Amide Nausea And Vomiting  . Penicillins Diarrhea, Nausea And Vomiting and Other (See Comments)    Has patient had a PCN reaction causing immediate rash, facial/tongue/throat swelling, SOB or lightheadedness with hypotension: Yes Has patient had a PCN reaction causing severe rash involving mucus membranes or skin necrosis: No Has patient had a PCN reaction that required  hospitalization No Has patient had a PCN reaction occurring within the last 10 years: Yes  If all of the above answers are "NO", then may proceed with Cephalosporin use.   . Buprenorphine Hcl Rash  . Encainide Nausea And Vomiting  . Metoclopramide Other (See Comments)    Dystonia, muscle rigidity.  Patient states she is only allergic to the "pill form, not the IV"    Consultations:  NONE  Procedures/Studies: Ir Venipuncture 50yr/older By Md  Result Date: 01/20/2019 INDICATION: 53year old female type I diabetic with extensive medical problems currently in the emergency room with severe abdominal pain and nausea. She has a history of extremely poor venous access. Over-the 3 hours she was in the emergency room, the only venous access the could be established was a left lower extremity intraosseous access. IV team was unsuccessful at obtaining peripheral or ultrasound-guided IV access. Current emergency room staff are unable to place a central line. Critical care medicine was also consulted and has had trouble with establishing central venous access on this challenging patient in the past. Therefore, interventional radiology was consulted for emergent bedside venous access. EXAM: VENIPUNCTURE BY MD; IR ULTRASOUND GUIDANCE VASC ACCESS RIGHT MEDICATIONS: None ANESTHESIA/SEDATION: None FLUOROSCOPY TIME:  None COMPLICATIONS: None immediate. PROCEDURE: The right brachial vein was interrogated with ultrasound and found to be widely patent. An image was obtained and stored for the medical record. The right upper extremity was sterilely prepped with chlorhexidine. Under real-time sonographic guidance, the vessel was punctured with a 21 gauge micropuncture needle. Using standard technique, the initial micro needle was exchanged over a 0.018 micro wire for a transitional 4 [place] sheath. A valved cap was attached to the sheath which was then aspirated and flushed. The venous access works well. The access  was secured to the arm with a sterile dressing. IMPRESSION: Ultrasound-guided venous access of the right upper extremity brachial vein. Electronically Signed   By: HJacqulynn CadetM.D.   On: 01/20/2019 10:58   Ir [place] Vasc Access Right  Result Date: 01/20/2019 INDICATION: 53year old female type I diabetic with extensive medical problems currently in the emergency room with severe abdominal pain and nausea. She has a history of extremely poor venous access. Over-the 3 hours she was in the emergency room, the only venous access the could be established was a left lower extremity intraosseous access. IV team was unsuccessful at obtaining peripheral or ultrasound-guided IV access. Current emergency room staff are unable to place a central line. Critical care medicine was also consulted and has had trouble with establishing central venous access on this challenging patient in the past. Therefore, interventional radiology was consulted for emergent bedside venous access. EXAM: VENIPUNCTURE BY MD; IR ULTRASOUND GUIDANCE VASC ACCESS RIGHT  MEDICATIONS: None ANESTHESIA/SEDATION: None FLUOROSCOPY TIME:  None COMPLICATIONS: None immediate. PROCEDURE: The right brachial vein was interrogated with ultrasound and found to be widely patent. An image was obtained and stored for the medical record. The right upper extremity was sterilely prepped with chlorhexidine. Under real-time sonographic guidance, the vessel was punctured with a 21 gauge micropuncture needle. Using standard technique, the initial micro needle was exchanged over a 0.018 micro wire for a transitional 4 French micro sheath. A valved cap was attached to the sheath which was then aspirated and flushed. The venous access works well. The access was secured to the arm with a sterile dressing. IMPRESSION: Ultrasound-guided venous access of the right upper extremity brachial vein. Electronically Signed   By: Heath  McCullough M.D.   On: 01/20/2019 10:58   Dg  Chest Portable 1 View  Result Date: 02/13/2019 CLINICAL DATA:  52-year-old female status post right IJ central line placement. EXAM: PORTABLE CHEST 1 VIEW COMPARISON:  Prior chest x-ray 02/13/2019 FINDINGS: Interval placement of a right IJ approach central venous catheter. The catheter tip is in good position overlying the cavoatrial junction. Patient is status post median sternotomy with evidence of prior multivessel CABG. The lungs are clear. No evidence of new pleural effusion or pneumothorax. No acute osseous abnormality. IMPRESSION: New right IJ central venous catheter appears to be in good position with the tip overlying the cavoatrial junction. No evidence of pneumothorax or pleural effusion. Electronically Signed   By: Heath  McCullough M.D.   On: 02/13/2019 16:56   Dg Chest Port 1 View  Result Date: 02/13/2019 CLINICAL DATA:  Shortness of breath EXAM: PORTABLE CHEST 1 VIEW COMPARISON:  12/22/2018 FINDINGS: Cardiac shadows within normal limits. Postsurgical changes are noted. Lungs are clear. No bony abnormality is noted. IMPRESSION: No acute abnormality seen Electronically Signed   By: Mark  Lukens M.D.   On: 02/13/2019 13:24   Ir Picc Placement Right >5 Yrs Inc Img Guide  Result Date: 01/20/2019 INDICATION: DIABETIC KETOACIDOSIS, LIMITED PERIPHERAL ACCESS EXAM: FLUOROSCOPIC GUIDED PICC LINE INSERTION MEDICATIONS: 1% LIDOCAINE LOCAL CONTRAST:  None FLUOROSCOPY TIME:  18.0 seconds (1.9 mGy) COMPLICATIONS: None immediate. TECHNIQUE: The procedure, risks, benefits, and alternatives were explained to the patient and informed written consent was obtained. A timeout was performed prior to the initiation of the procedure. Under sterile conditions and local anesthesia, the existing right upper arm brachial midline access was removed over a 018 guidewire. Guidewire position centrally at the SVC RA junction. Measurements obtained for the appropriate length. A peel-away sheath was placed and a 31 cm, 6  French, triple lumen was inserted to level of the superior caval-atrial junction. A post procedure spot fluoroscopic was obtained. The catheter easily aspirated and flushed and was secured in place. A dressing was placed. The patient tolerated the procedure well without immediate post procedural complication. FINDINGS: After catheter placement, the tip lies within the superior cavoatrial junction. The catheter aspirates and flushes normally and is ready for immediate use. IMPRESSION: Successful fluoroscopic guided placement of a right brachial vein approach, 31 cm, 6 French, triple lumen PICC with tip at the superior caval-atrial junction. The PICC line is ready for immediate use. Electronically Signed   By: M.  Shick M.D.   On: 01/20/2019 16:30   Us Ekg Site Rite  Result Date: 01/20/2019 If Site Rite image not attached, placement could not be confirmed due to current cardiac rhythm.  Subjective: This am nausea better. But reports she was having ongoing diarrhea yesterday.Has had diarrhea with   DKA previously.  She gets diarrhea right after eating meal and has gastroparesis. No abdominal pain. Received imodium overnight.  She tolerated breakfast well, and finished her full lunch.  No recurrence of diarrhea nausea vomiting.  She feels well blood sugar did not drop any more insulin more than 12 hours.  She wants to go home today.   Discharge Exam: Vitals:   02/14/19 2120 02/15/19 0551  BP: 140/64 (!) 113/56  Pulse: 82 68  Resp: 16 19  Temp: 97.7 F (36.5 C) 97.8 F (36.6 C)  SpO2: 100% 100%   Vitals:   02/14/19 1226 02/14/19 1227 02/14/19 2120 02/15/19 0551  BP:  139/62 140/64 (!) 113/56  Pulse:  80 82 68  Resp:  _0 Temp: 98.4 F (36.9 C)  97.7 F (36.5 C) 97.8 F (36.6 C)  TempSrc: Oral   Oral  SpO2:  100% 100% 100%  Weight:      Height:        General: Pt is alert, awake, not in acute distress Cardiovascular: RRR, S1/S2 +, no rubs, no gallops Respiratory: CTA bilaterally,  no wheezing, no rhonchi Abdominal: Soft, NT, ND, bowel sounds + Extremities: no edema, no cyanosis   The results of significant diagnostics from this hospitalization (including imaging, microbiology, ancillary and laboratory) are listed below for reference.     Microbiology: Recent Results (from the past 240 hour(s))  SARS Coronavirus 2 [facility] order, Performed in [facility] hospital lab) Nasopharyngeal Nasopharyngeal Swab     Status: None   Collection Time: 02/13/19  5:41 PM   Specimen: Nasopharyngeal Swab  Result Value Ref Range Status   SARS Coronavirus 2 NEGATIVE NEGATIVE Final    Comment: (NOTE) If result is NEGATIVE SARS-CoV-2 target nucleic acids are NOT DETECTED. The SARS-CoV-2 RNA is generally detectable in upper and lower  respiratory specimens during the acute phase of infection. The lowest  concentration of SARS-CoV-2 viral copies this assay can detect is 250  copies / mL. A negative result does not preclude SARS-CoV-2 infection  and should not be used as the sole basis for treatment or other  patient management decisions.  A negative result may occur with  improper specimen collection / handling, submission of specimen other  than nasopharyngeal swab, presence of viral mutation(s) within the  areas targeted by this assay, and inadequate number of viral copies  (<250 copies / mL). A negative result must be combined with clinical  observations, patient history, and epidemiological information. If result is POSITIVE SARS-CoV-2 target nucleic acids are DETECTED. The SARS-CoV-2 RNA is generally detectable in upper and lower  respiratory specimens dur ing the acute phase of infection.  Positive  results are indicative of active infection with SARS-CoV-2.  Clinical  correlation with patient history and other diagnostic information is  necessary to determine patient infection status.  Positive results do  not rule out bacterial infection or co-infection with other  viruses. If result is PRESUMPTIVE POSTIVE SARS-CoV-2 nucleic acids MAY BE PRESENT.   A presumptive positive result was obtained on the submitted specimen  and confirmed on repeat testing.  While 2019 novel coronavirus  (SARS-CoV-2) nucleic acids may be present in the submitted sample  additional confirmatory testing may be necessary for epidemiological  and / or clinical management purposes  to differentiate between  SARS-CoV-2 and other Sarbecovirus currently known to infect humans.  If clinically indicated additional testing with an alternate test  methodology [id number]) is advised. The SARS-CoV-2 RNA is generally  detectable  in upper and lower respiratory sp ecimens during the acute  phase of infection. The expected result is Negative. Fact Sheet for Patients:  StrictlyIdeas.no Fact Sheet for Healthcare Providers: BankingDealers.co.za This test is not yet approved or cleared by the [place] FDA and has been authorized for detection and/or diagnosis of SARS-CoV-2 by FDA under an Emergency Use Authorization (EUA).  This EUA will remain in effect (meaning this test can be used) for the duration of the COVID-19 declaration under Section 564(b)(1) of the Act, 21 U.S.C. section 360bbb-3(b)(1), unless the authorization is terminated or revoked sooner. Performed at [facility], [place] [address], [place], [place] [address]   Urine culture     Status: None   Collection Time: 02/13/19  6:37 PM   Specimen: Urine, Clean Catch  Result Value Ref Range Status   Specimen Description URINE, CLEAN CATCH  Final   Special Requests   Final    NONE Performed at [facility], [place] [address], [place], [place] [address]    Culture   Final    Multiple bacterial morphotypes present, none predominant. Suggest appropriate recollection if clinically indicated.   Report Status 02/15/2019 FINAL  Final     Labs: BNP (last 3 results) No results  for input(s): BNP in the last 8760 hours. Basic Metabolic Panel: Recent Labs  Lab 02/13/19 2042 02/14/19 0047 02/14/19 0355 02/14/19 0802 02/15/19 0453  NA 147* 146* 148* 146* 139  K 4.5 4.5 5.2* 4.0 3.6  CL 118* 117* 119* 115* 114*  CO2 16* 20* 18* 21* 20*  GLUCOSE 268* 148* 114* 123* 253*  BUN 29* 27* 27* 24* 16  CREATININE 1.67* 1.53* 1.52* 1.39* 1.19*  CALCIUM 9.6 9.2 9.2 9.0 8.2*   Liver Function Tests: Recent Labs  Lab 02/13/19 1256  AST 20  ALT 15  ALKPHOS 95  BILITOT 0.9  PROT 8.4*  ALBUMIN 4.3   No results for input(s): LIPASE, AMYLASE in the last 168 hours. No results for input(s): AMMONIA in the last 168 hours. CBC: Recent Labs  Lab 02/13/19 1256 02/13/19 1425  WBC 10.4  --   NEUTROABS 9.4*  --   HGB 11.0* 12.6  HCT 38.2 37.0  MCV 86.0  --   PLT 463*  --    Cardiac Enzymes: No results for input(s): CKTOTAL, CKMB, CKMBINDEX, TROPONINI in the last 168 hours. BNP: Invalid input(s): POCBNP CBG: Recent Labs  Lab 02/15/19 0111 02/15/19 0133 02/15/19 0418 02/15/19 0803 02/15/19 1204  GLUCAP 69* 148* 221* 116* 97   D-Dimer No results for input(s): DDIMER in the last 72 hours. Hgb A1c No results for input(s): HGBA1C in the last 72 hours. Lipid Profile No results for input(s): CHOL, HDL, LDLCALC, TRIG, CHOLHDL, LDLDIRECT in the last 72 hours. Thyroid function studies No results for input(s): TSH, T4TOTAL, T3FREE, THYROIDAB in the last 72 hours.  Invalid input(s): FREET3 Anemia work up No results for input(s): VITAMINB12, FOLATE, FERRITIN, TIBC, IRON, RETICCTPCT in the last 72 hours. Urinalysis    Component Value Date/Time   COLORURINE YELLOW 02/13/2019 1834   APPEARANCEUR HAZY (A) 02/13/2019 1834   LABSPEC 1.026 02/13/2019 1834   PHURINE 5.0 02/13/2019 1834   GLUCOSEU >=500 (A) 02/13/2019 1834   HGBUR SMALL (A) 02/13/2019 1834   BILIRUBINUR NEGATIVE 02/13/2019 1834   BILIRUBINUR negative 11/13/2018 1102   KETONESUR 80 (A) 02/13/2019  1834   PROTEINUR 100 (A) 02/13/2019 1834   UROBILINOGEN 0.2 11/13/2018 1102   UROBILINOGEN 0.2 04/27/2015 1608  NITRITE NEGATIVE 02/13/2019 1834   LEUKOCYTESUR NEGATIVE 02/13/2019 1834   Sepsis Labs Invalid input(s): PROCALCITONIN,  WBC,  LACTICIDVEN Microbiology Recent Results (from the past 240 hour(s))  SARS Coronavirus 2 (Hospital order, Performed in [facility] hospital lab) Nasopharyngeal Nasopharyngeal Swab     Status: None   Collection Time: 02/13/19  5:41 PM   Specimen: Nasopharyngeal Swab  Result Value Ref Range Status   SARS Coronavirus 2 NEGATIVE NEGATIVE Final    Comment: (NOTE) If result is NEGATIVE SARS-CoV-2 target nucleic acids are NOT DETECTED. The SARS-CoV-2 RNA is generally detectable in upper and lower  respiratory specimens during the acute phase of infection. The lowest  concentration of SARS-CoV-2 viral copies this assay can detect is 250  copies / mL. A negative result does not preclude SARS-CoV-2 infection  and should not be used as the sole basis for treatment or other  patient management decisions.  A negative result may occur with  improper specimen collection / handling, submission of specimen other  than nasopharyngeal swab, presence of viral mutation(s) within the  areas targeted by this assay, and inadequate number of viral copies  (<250 copies / mL). A negative result must be combined with clinical  observations, patient history, and epidemiological information. If result is POSITIVE SARS-CoV-2 target nucleic acids are DETECTED. The SARS-CoV-2 RNA is generally detectable in upper and lower  respiratory specimens dur ing the acute phase of infection.  Positive  results are indicative of active infection with SARS-CoV-2.  Clinical  correlation with patient history and other diagnostic information is  necessary to determine patient infection status.  Positive results do  not rule out bacterial infection or co-infection with other viruses. If  result is PRESUMPTIVE POSTIVE SARS-CoV-2 nucleic acids MAY BE PRESENT.   A presumptive positive result was obtained on the submitted specimen  and confirmed on repeat testing.  While 2019 novel coronavirus  (SARS-CoV-2) nucleic acids may be present in the submitted sample  additional confirmatory testing may be necessary for epidemiological  and / or clinical management purposes  to differentiate between  SARS-CoV-2 and other Sarbecovirus currently known to infect humans.  If clinically indicated additional testing with an alternate test  methodology ([id number]) is advised. The SARS-CoV-2 RNA is generally  detectable in upper and lower respiratory sp ecimens during the acute  phase of infection. The expected result is Negative. Fact Sheet for Patients:  https://www.fda.gov/media/136312/download Fact Sheet for Healthcare Providers: https://www.fda.gov/media/136313/download This test is not yet approved or cleared by the [place] FDA and has been authorized for detection and/or diagnosis of SARS-CoV-2 by FDA under an Emergency Use Authorization (EUA).  This EUA will remain in effect (meaning this test can be used) for the duration of the COVID-19 declaration under Section 564(b)(1) of the Act, 21 U.S.C. section 360bbb-3(b)(1), unless the authorization is terminated or revoked sooner. Performed at [facility], [address] [place], [place] [address]   Urine culture     Status: None   Collection Time: 02/13/19  6:37 PM   Specimen: Urine, Clean Catch  Result Value Ref Range Status   Specimen Description URINE, CLEAN CATCH  Final   Special Requests   Final    NONE Performed at [facility], [address] [place], [place] [address]    Culture   Final    Multiple bacterial morphotypes present, none predominant. Suggest appropriate recollection if clinically indicated.   Report Status 02/15/2019 FINAL  Final    Time coordinating discharge:   25  minutes  SIGNED:   Antonieta Pert, MD  [facility] 02/15/2019, 3:39 PM  If 7PM-7AM, please contact night-coverage [email or web address]

## 2019-02-16 NOTE — ED Provider Notes (Signed)
.  Central Line  Date/Time: 02/16/2019 10:55 AM Performed by: Maudie Flakes, MD Authorized by: Maudie Flakes, MD   Consent:    Consent obtained:  Verbal and emergent situation   Consent given by:  Patient   Risks discussed:  Incorrect placement, arterial puncture and infection Pre-procedure details:    Hand hygiene: Hand hygiene performed prior to insertion     Sterile barrier technique: All elements of maximal sterile technique followed     Skin preparation:  2% chlorhexidine   Skin preparation agent: Skin preparation agent completely dried prior to procedure   Procedure details:    Location:  R internal jugular   Patient position:  Trendelenburg   Procedural supplies:  Triple lumen   Landmarks identified: yes     Ultrasound guidance: yes     Sterile ultrasound techniques: Sterile gel and sterile probe covers were used     Number of attempts:  1   Successful placement: yes   Post-procedure details:    Post-procedure:  Dressing applied and line sutured   Assessment:  Blood return through all ports   Patient tolerance of procedure:  Tolerated well, no immediate complications Comments:     There was some degree of difficulty with advancement of the dilator, thought to be related to scar tissue from prior central lines placed in the same site. Ultrasound ED Peripheral IV (Provider)  Date/Time: 02/16/2019 10:57 AM Performed by: Maudie Flakes, MD Authorized by: Maudie Flakes, MD   Procedure details:    Indications: multiple failed IV attempts     Skin Prep: chlorhexidine gluconate     Location: Right upper arm.   Angiocath:  20 G   Bedside Ultrasound Guided: Yes     Images: not archived     Patient tolerated procedure without complications: Yes   Comments:     Failed attempt at ultrasound-guided peripheral IV, necessitating need for central access      Maudie Flakes, MD 02/16/19 1058

## 2019-02-17 LAB — GLUCOSE, CAPILLARY: Glucose-Capillary: 44 mg/dL — CL (ref 70–99)

## 2019-03-02 ENCOUNTER — Emergency Department (HOSPITAL_COMMUNITY): Payer: Medicare HMO

## 2019-03-02 ENCOUNTER — Emergency Department (HOSPITAL_COMMUNITY)
Admission: EM | Admit: 2019-03-02 | Discharge: 2019-03-02 | Disposition: A | Discharge status: Home / Self Care | Payer: Medicare HMO | Attending: Emergency Medicine | Admitting: Emergency Medicine

## 2019-03-02 ENCOUNTER — Other Ambulatory Visit: Payer: Self-pay

## 2019-03-02 DIAGNOSIS — E109 Type 1 diabetes mellitus without complications: Secondary | ICD-10-CM | POA: Insufficient documentation

## 2019-03-02 DIAGNOSIS — R112 Nausea with vomiting, unspecified: Secondary | ICD-10-CM | POA: Insufficient documentation

## 2019-03-02 DIAGNOSIS — R1084 Generalized abdominal pain: Secondary | ICD-10-CM | POA: Diagnosis not present

## 2019-03-02 DIAGNOSIS — Z79899 Other long term (current) drug therapy: Secondary | ICD-10-CM | POA: Diagnosis not present

## 2019-03-02 DIAGNOSIS — Z7982 Long term (current) use of aspirin: Secondary | ICD-10-CM | POA: Insufficient documentation

## 2019-03-02 DIAGNOSIS — Z794 Long term (current) use of insulin: Secondary | ICD-10-CM | POA: Insufficient documentation

## 2019-03-02 DIAGNOSIS — E1143 Type 2 diabetes mellitus with diabetic autonomic (poly)neuropathy: Secondary | ICD-10-CM

## 2019-03-02 DIAGNOSIS — Z951 Presence of aortocoronary bypass graft: Secondary | ICD-10-CM | POA: Insufficient documentation

## 2019-03-02 DIAGNOSIS — I10 Essential (primary) hypertension: Secondary | ICD-10-CM | POA: Diagnosis not present

## 2019-03-02 DIAGNOSIS — I259 Chronic ischemic heart disease, unspecified: Secondary | ICD-10-CM | POA: Diagnosis not present

## 2019-03-02 DIAGNOSIS — K3184 Gastroparesis: Secondary | ICD-10-CM

## 2019-03-02 LAB — CBG MONITORING, ED
Glucose-Capillary: 307 mg/dL — ABNORMAL HIGH (ref 70–99)
Glucose-Capillary: 289 mg/dL — ABNORMAL HIGH (ref 70–99)

## 2019-03-02 LAB — CBC WITH DIFFERENTIAL/PLATELET
Abs Immature Granulocytes: 0.02 10*3/uL (ref 0.00–0.07)
Basophils Absolute: 0.1 10*3/uL (ref 0.0–0.1)
Basophils Relative: 1 %
Eosinophils Absolute: 0.1 10*3/uL (ref 0.0–0.5)
Eosinophils Relative: 1 %
HCT: 31.1 % — ABNORMAL LOW (ref 36.0–46.0)
Hemoglobin: 9.2 g/dL — ABNORMAL LOW (ref 12.0–15.0)
Immature Granulocytes: 0 %
Lymphocytes Relative: 13 %
Lymphs Abs: 1 10*3/uL (ref 0.7–4.0)
MCH: 24.1 pg — ABNORMAL LOW (ref 26.0–34.0)
MCHC: 29.6 g/dL — ABNORMAL LOW (ref 30.0–36.0)
MCV: 81.6 fL (ref 80.0–100.0)
Monocytes Absolute: 0.5 10*3/uL (ref 0.1–1.0)
Monocytes Relative: 6 %
Neutro Abs: 6.2 10*3/uL (ref 1.7–7.7)
Neutrophils Relative %: 79 %
Platelets: 389 10*3/uL (ref 150–400)
RBC: 3.81 MIL/uL — ABNORMAL LOW (ref 3.87–5.11)
RDW: 15.4 % (ref 11.5–15.5)
WBC: 7.9 10*3/uL (ref 4.0–10.5)
nRBC: 0 % (ref 0.0–0.2)

## 2019-03-02 LAB — BLOOD GAS, VENOUS
Acid-Base Excess: 0.1 mmol/L (ref 0.0–2.0)
Bicarbonate: 25.3 mmol/L (ref 20.0–28.0)
FIO2: 21
O2 Saturation: 73.2 %
Patient temperature: 97.7
pCO2, Ven: 45.6 mmHg (ref 44.0–60.0)
pH, Ven: 7.36 (ref 7.250–7.430)
pO2, Ven: 42.1 mmHg (ref 32.0–45.0)

## 2019-03-02 LAB — COMPREHENSIVE METABOLIC PANEL
ALT: 13 U/L (ref 0–44)
AST: 20 U/L (ref 15–41)
Albumin: 3.7 g/dL (ref 3.5–5.0)
Alkaline Phosphatase: 87 U/L (ref 38–126)
Anion gap: 10 (ref 5–15)
BUN: 9 mg/dL (ref 6–20)
CO2: 23 mmol/L (ref 22–32)
Calcium: 9.1 mg/dL (ref 8.9–10.3)
Chloride: 103 mmol/L (ref 98–111)
Creatinine, Ser: 1.26 mg/dL — ABNORMAL HIGH (ref 0.44–1.00)
GFR calc Af Amer: 57 mL/min — ABNORMAL LOW (ref >60)
GFR calc non Af Amer: 49 mL/min — ABNORMAL LOW (ref >60)
Glucose, Bld: 302 mg/dL — ABNORMAL HIGH (ref 70–99)
Potassium: 4.4 mmol/L (ref 3.5–5.1)
Sodium: 136 mmol/L (ref 135–145)
Total Bilirubin: 0.8 mg/dL (ref 0.3–1.2)
Total Protein: 7.5 g/dL (ref 6.5–8.1)

## 2019-03-02 LAB — URINALYSIS, ROUTINE W REFLEX MICROSCOPIC
Bilirubin Urine: NEGATIVE
Glucose, UA: >=500 mg/dL — AB
Hgb urine dipstick: NEGATIVE
Ketones, ur: 20 mg/dL — AB
Leukocytes,Ua: NEGATIVE
Nitrite: NEGATIVE
Protein, ur: 100 mg/dL — AB
Specific Gravity, Urine: 1.016 (ref 1.005–1.030)
pH: 6 (ref 5.0–8.0)

## 2019-03-02 LAB — RAPID URINE DRUG SCREEN, HOSP PERFORMED
Amphetamines: NOT DETECTED
Barbiturates: NOT DETECTED
Benzodiazepines: NOT DETECTED
Cocaine: NOT DETECTED
Opiates: NOT DETECTED
Tetrahydrocannabinol: NOT DETECTED

## 2019-03-02 LAB — LIPASE, BLOOD: Lipase: 36 U/L (ref 11–51)

## 2019-03-02 LAB — PREGNANCY, URINE: Preg Test, Ur: NEGATIVE

## 2019-03-02 MED ORDER — HALOPERIDOL LACTATE 5 MG/ML IJ SOLN
2.0000 mg | Freq: Once | INTRAMUSCULAR | Status: AC
  Administered 2019-03-02: 2 mg via INTRAVENOUS
  Filled 2019-03-02: qty 1

## 2019-03-02 MED ORDER — INSULIN ASPART 100 UNIT/ML ~~LOC~~ SOLN
5.0000 [IU] | Freq: Once | SUBCUTANEOUS | Status: DC
  Filled 2019-03-02: qty 0.05

## 2019-03-02 MED ORDER — ONDANSETRON 4 MG PO TBDP
4.0000 mg | ORAL_TABLET | Freq: Once | ORAL | Status: AC
  Administered 2019-03-02: 4 mg via ORAL
  Filled 2019-03-02: qty 1

## 2019-03-02 MED ORDER — SODIUM CHLORIDE 0.9 % IV BOLUS (SEPSIS)
1000.0000 mL | Freq: Once | INTRAVENOUS | Status: AC
  Administered 2019-03-02: 03:00:00 1000 mL via INTRAVENOUS

## 2019-03-02 MED ORDER — METOCLOPRAMIDE HCL 5 MG/ML IJ SOLN
10.0000 mg | Freq: Once | INTRAMUSCULAR | Status: AC
  Administered 2019-03-02: 10 mg via INTRAVENOUS
  Filled 2019-03-02: qty 2

## 2019-03-02 MED ORDER — SODIUM CHLORIDE 0.9 % IV SOLN
INTRAVENOUS | Status: DC
  Administered 2019-03-02: 04:00:00 via INTRAVENOUS

## 2019-03-02 MED ORDER — ONDANSETRON 4 MG PO TBDP: 4.0000 mg | ORAL_TABLET | Freq: Four times a day (QID) | ORAL | 0 refills | Status: DC | PRN

## 2019-03-02 MED ORDER — PROMETHAZINE HCL 25 MG PO TABS: 25.0000 mg | ORAL_TABLET | Freq: Four times a day (QID) | ORAL | 0 refills | Status: DC | PRN

## 2019-03-02 NOTE — ED Notes (Signed)
CBG 289 

## 2019-03-02 NOTE — ED Triage Notes (Signed)
Patient felt bad for couple days started have N/v at 1900 last night, has cardiac hx of STEMI, triple bypass, feet and legs are swollen for couple days, hx of gastroparesis, DM cbg 335,

## 2019-03-02 NOTE — ED Notes (Signed)
Patient given diet gingerale for po challenge

## 2019-03-02 NOTE — ED Provider Notes (Signed)
TIME SEEN: 1:31 AM  CHIEF COMPLAINT: Abdominal pain, nausea and vomiting  HPI: Patient is a 53 year old female with history of CAD status post CABG, diabetes with recurrent DKA, hypertension, hyperlipidemia, gastroparesis who presents to the emergency department with 2 days of diffuse abdominal pain, nausea and vomiting.  States her blood sugars have been "up and down".  She reports compliance with her medications.  No fevers, cough, chest pain.  States she does feel short of breath because of the abdominal pain.  No diarrhea.  ROS: See HPI Constitutional: no fever  Eyes: no drainage  ENT: no runny nose   Cardiovascular:  no chest pain  Resp:  SOB  GI:  vomiting GU: no dysuria Integumentary: no rash  Allergy: no hives  Musculoskeletal: no leg swelling  Neurological: no slurred speech ROS otherwise negative  PAST MEDICAL HISTORY/PAST SURGICAL HISTORY:  Past Medical History:  Diagnosis Date  . AKI (acute kidney injury) ([place])   . Anemia, iron deficiency   . Anxiety and depression 05/18/2015  . CAD (coronary artery disease), native coronary artery 01/11/2018   Non-STEMI with severe three-vessel disease noted July 2019  . Depression   . Diabetes mellitus without complication ([place])   . Diabetic gastroparesis ([place])   . Diabetic neuropathy, type I diabetes mellitus ([place]) 05/18/2015  . DKA (diabetic ketoacidoses) ([place]) 06/16/2017  . DKA, type 1 ([place]) 03/18/2016  . Essential hypertension   . Gastroparesis   . GERD (gastroesophageal reflux disease)   . HLD (hyperlipidemia)   . MDD (major depressive disorder), single episode, severe , no psychosis ([place])   . Non-ST elevation (NSTEMI) myocardial infarction ([place])   . S/P CABG x 3 01/14/2018  . Tardive dyskinesia   . Vitamin B12 deficiency 08/16/2015    MEDICATIONS:  Prior to Admission medications   Medication Sig Start Date End Date Taking? Authorizing Provider  ammonium lactate (AMLACTIN) 12 % cream Apply topically as needed for dry  skin. 07/08/18   Trula Slade, DPM  aspirin EC 81 MG tablet Take 1 tablet (81 mg total) by mouth daily. 06/03/18   Nahser, Wonda Cheng, MD  Blood Glucose Monitoring Suppl (ONETOUCH VERIO) w/Device KIT Use as directed 03/21/18   [name], MD  busPIRone (BUSPAR) 10 MG tablet Take 1 tablet (10 mg total) by mouth 3 (three) times daily. 06/12/18   [name], MD  docusate sodium (COLACE) 100 MG capsule Take 1 capsule (100 mg total) by mouth daily as needed for mild constipation. 02/03/18   Burtis Junes, NP  glucose blood (ACCU-CHEK AVIVA PLUS) test strip Use as instructed to test blood sugar 4 times daily Dx E10.65 12/18/18   Elayne Snare, MD  insulin aspart (NOVOLOG) 100 UNIT/ML injection Inject 0-12 Units into the skin 3 (three) times daily before meals. As per sliding scale 10/14/18   Elayne Snare, MD  Insulin Glargine (LANTUS SOLOSTAR) 100 UNIT/ML Solostar Pen Inject 15 units under the skin once daily in the morning. 10/14/18   Elayne Snare, MD  Insulin Syringe-Needle U-100 (BD INSULIN SYRINGE ULTRAFINE) 31G X 5/16" 0.5 ML MISC 1 each by Does not apply route 4 (four) times daily. 04/28/18   Elayne Snare, MD  Lancets [facility] DELICA PLUS [id number]) [place] 1 Units by Does not apply route as directed. 03/21/18   [name], MD  loperamide (IMODIUM) 2 MG capsule Take 1 capsule (2 mg total) by mouth 3 (three) times daily as needed for diarrhea or loose stools. 12/25/18   Aline August, MD  metoprolol  tartrate (LOPRESSOR) 25 MG tablet Take 0.5 tablets (12.5 mg total) by mouth 2 (two) times daily. 10/08/18   Nahser, Wonda Cheng, MD  pantoprazole (PROTONIX) 40 MG tablet Take 1 tablet (40 mg total) by mouth daily. 06/12/18   [name], MD  pregabalin (LYRICA) 150 MG capsule Take 1 capsule (150 mg total) by mouth 2 (two) times daily. 01/02/19   Elayne Snare, MD  promethazine (PHENERGAN) 12.5 MG tablet Take 1 tablet (12.5 mg total) by mouth every 8 (eight) hours as needed for up to 20 doses for nausea or  vomiting. 01/22/19   Hosie Poisson, MD  pyridostigmine (MESTINON) 60 MG tablet Take 60 mg by mouth 3 (three) times daily.    [provider]  senna-docusate (SENOKOT-S) 8.6-50 MG tablet Take 1 tablet by mouth daily as needed for mild constipation. 01/28/19   Ladell Pier, MD    ALLERGIES:  Allergies  Allergen Reactions  . Anesthetics, Amide Nausea And Vomiting  . Penicillins Diarrhea, Nausea And Vomiting and Other (See Comments)    Has patient had a PCN reaction causing immediate rash, facial/tongue/throat swelling, SOB or lightheadedness with hypotension: Yes Has patient had a PCN reaction causing severe rash involving mucus membranes or skin necrosis: No Has patient had a PCN reaction that required hospitalization No Has patient had a PCN reaction occurring within the last 10 years: Yes  If all of the above answers are "NO", then may proceed with Cephalosporin use.   . Buprenorphine Hcl Rash  . Encainide Nausea And Vomiting  . Metoclopramide Other (See Comments)    Dystonia, muscle rigidity.  Patient states she is only allergic to the "pill form, not the IV"    SOCIAL HISTORY:  Social History   Tobacco Use  . Smoking status: Never Smoker  . Smokeless tobacco: Never Used  Substance Use Topics  . Alcohol use: No    FAMILY HISTORY: Family History  Problem Relation Age of Onset  . Cystic fibrosis Mother   . Hypertension Father   . Diabetes Brother   . Hypertension Maternal Grandmother     EXAM: BP (!) 164/68   Pulse 95   Temp 97.7 F (36.5 C) (Oral)   Resp 18   SpO2 100%  CONSTITUTIONAL: Alert and oriented x 4 and responds appropriately to questions.  Chronically ill-appearing, actively vomiting HEAD: Normocephalic EYES: Conjunctivae clear, pupils appear equal, EOMI ENT: normal nose; moist mucous membranes NECK: Supple, no meningismus, no nuchal rigidity, no LAD  CARD: RRR; S1 and S2 appreciated; no murmurs, no clicks, no rubs, no gallops RESP: Normal  chest excursion without splinting, patient is tachypneic; breath sounds clear and equal bilaterally; no wheezes, no rhonchi, no rales, no hypoxia or respiratory distress, speaking full sentences ABD/GI: Normal bowel sounds; non-distended; soft, mildly tender to palpation diffusely throughout the abdomen, no rebound, no guarding, no peritoneal signs, no hepatosplenomegaly BACK:  The back appears normal and is non-tender to palpation, there is no CVA tenderness EXT: Normal ROM in all joints; non-tender to palpation; no edema; normal capillary refill; no cyanosis, no calf tenderness or swelling    SKIN: Normal color for age and race; warm; no rash NEURO: Moves all extremities equally PSYCH: The patient's mood and manner are appropriate. Grooming and personal hygiene are appropriate.  MEDICAL DECISION MAKING: Patient here with abdominal pain, vomiting.  Differential includes DKA, gastroparesis.  Blood sugar here is 307.  Will obtain labs, urine.  Patient has a history of being very difficult to obtain IV  access on.  Clinically at this time I do not feel she needs a central line unless labs suggest that she is in DKA.  We will continue to work on obtaining a peripheral IV.  ED PROGRESS: Nursing staff was unable to obtain ultrasound-guided peripheral IV.  Labs are not suggestive of DKA today.  Suspect gastroparesis as the cause of her symptoms.  I do not feel she has a surgical abdomen and I do not think she needs a CT of her abdomen pelvis at this time.  Will give IV Reglan and IV fluids.  Reports allergy to p.o. Reglan but states she can tolerate IV Reglan.  Given ODT Zofran as well in the ED.    4:30 AM  Pt has not had significant relief with Zofran and Reglan.  She has not had any further vomiting however.  Will give IV Haldol.  Patient is no longer tachypneic.  Blood glucose now 289.  5:45 AM  Pt reports feeling better after Haldol.  She is been able to tolerate a small amount of oral fluids and has  not had any further vomiting after initial antiemetics given.  I feel she is safe to be discharged home.  Recommended bland diet.  Will discharge with prescriptions of Zofran and Phenergan.   At this time, I do not feel there is any life-threatening condition present. I have reviewed and discussed all results (EKG, imaging, lab, urine as appropriate) and exam findings with patient/family. I have reviewed nursing notes and appropriate previous records.  I feel the patient is safe to be discharged home without further emergent workup and can continue workup as an outpatient as needed. Discussed usual and customary return precautions. Patient/family verbalize understanding and are comfortable with this plan.  Outpatient follow-up has been provided as needed. All questions have been answered.    EKG Interpretation  Date/Time:  Monday March 02 2019 01:34:07 EDT Ventricular Rate:  99 PR Interval:    QRS Duration: 98 QT Interval:  361 QTC Calculation: 464 R Axis:   -32 Text Interpretation:  Sinus rhythm Left axis deviation Anterior infarct, old Minimal ST depression, lateral leads No significant change since last tracing Confirmed by Jeorgia Helming, Cyril Mourning [phone]) on 03/02/2019 2:33:18 AM         Kimmora Risenhoover, Delice Bison, DO 03/02/19 2992

## 2019-03-03 ENCOUNTER — Emergency Department (HOSPITAL_COMMUNITY)
Admission: EM | Admit: 2019-03-03 | Discharge: 2019-03-03 | Disposition: A | Discharge status: Home / Self Care | Payer: Medicare HMO | Attending: Emergency Medicine | Admitting: Emergency Medicine

## 2019-03-03 ENCOUNTER — Encounter (HOSPITAL_COMMUNITY): Payer: Self-pay | Admitting: Emergency Medicine

## 2019-03-03 DIAGNOSIS — Z951 Presence of aortocoronary bypass graft: Secondary | ICD-10-CM | POA: Diagnosis not present

## 2019-03-03 DIAGNOSIS — Z7982 Long term (current) use of aspirin: Secondary | ICD-10-CM | POA: Insufficient documentation

## 2019-03-03 DIAGNOSIS — I10 Essential (primary) hypertension: Secondary | ICD-10-CM | POA: Diagnosis not present

## 2019-03-03 DIAGNOSIS — R1013 Epigastric pain: Secondary | ICD-10-CM | POA: Diagnosis present

## 2019-03-03 DIAGNOSIS — K3184 Gastroparesis: Secondary | ICD-10-CM | POA: Diagnosis not present

## 2019-03-03 DIAGNOSIS — E1143 Type 2 diabetes mellitus with diabetic autonomic (poly)neuropathy: Secondary | ICD-10-CM

## 2019-03-03 DIAGNOSIS — E1043 Type 1 diabetes mellitus with diabetic autonomic (poly)neuropathy: Secondary | ICD-10-CM | POA: Insufficient documentation

## 2019-03-03 DIAGNOSIS — I2510 Atherosclerotic heart disease of native coronary artery without angina pectoris: Secondary | ICD-10-CM | POA: Diagnosis not present

## 2019-03-03 DIAGNOSIS — Z79899 Other long term (current) drug therapy: Secondary | ICD-10-CM | POA: Diagnosis not present

## 2019-03-03 DIAGNOSIS — Z794 Long term (current) use of insulin: Secondary | ICD-10-CM | POA: Diagnosis not present

## 2019-03-03 DIAGNOSIS — Z9114 Patient's other noncompliance with medication regimen: Secondary | ICD-10-CM | POA: Insufficient documentation

## 2019-03-03 LAB — URINALYSIS, ROUTINE W REFLEX MICROSCOPIC
Bilirubin Urine: NEGATIVE
Glucose, UA: >=500 mg/dL — AB
Ketones, ur: 80 mg/dL — AB
Leukocytes,Ua: NEGATIVE
Nitrite: NEGATIVE
Protein, ur: 100 mg/dL — AB
Specific Gravity, Urine: 1.016 (ref 1.005–1.030)
pH: 6 (ref 5.0–8.0)

## 2019-03-03 LAB — CBC
HCT: 33.8 % — ABNORMAL LOW (ref 36.0–46.0)
Hemoglobin: 10.2 g/dL — ABNORMAL LOW (ref 12.0–15.0)
MCH: 24.1 pg — ABNORMAL LOW (ref 26.0–34.0)
MCHC: 30.2 g/dL (ref 30.0–36.0)
MCV: 79.7 fL — ABNORMAL LOW (ref 80.0–100.0)
Platelets: 373 10*3/uL (ref 150–400)
RBC: 4.24 MIL/uL (ref 3.87–5.11)
RDW: 15.6 % — ABNORMAL HIGH (ref 11.5–15.5)
WBC: 9 10*3/uL (ref 4.0–10.5)
nRBC: 0 % (ref 0.0–0.2)

## 2019-03-03 LAB — COMPREHENSIVE METABOLIC PANEL
ALT: 13 U/L (ref 0–44)
AST: 20 U/L (ref 15–41)
Albumin: 3.6 g/dL (ref 3.5–5.0)
Alkaline Phosphatase: 86 U/L (ref 38–126)
Anion gap: 15 (ref 5–15)
BUN: 12 mg/dL (ref 6–20)
CO2: 17 mmol/L — ABNORMAL LOW (ref 22–32)
Calcium: 9.2 mg/dL (ref 8.9–10.3)
Chloride: 108 mmol/L (ref 98–111)
Creatinine, Ser: 1.18 mg/dL — ABNORMAL HIGH (ref 0.44–1.00)
GFR calc Af Amer: >60 mL/min (ref >60)
GFR calc non Af Amer: 53 mL/min — ABNORMAL LOW (ref >60)
Glucose, Bld: 310 mg/dL — ABNORMAL HIGH (ref 70–99)
Potassium: 3.8 mmol/L (ref 3.5–5.1)
Sodium: 140 mmol/L (ref 135–145)
Total Bilirubin: 1.3 mg/dL — ABNORMAL HIGH (ref 0.3–1.2)
Total Protein: 7.6 g/dL (ref 6.5–8.1)

## 2019-03-03 LAB — TROPONIN I (HIGH SENSITIVITY): Troponin I (High Sensitivity): 7 ng/L (ref <18)

## 2019-03-03 LAB — LIPASE, BLOOD: Lipase: 23 U/L (ref 11–51)

## 2019-03-03 LAB — CBG MONITORING, ED: Glucose-Capillary: 272 mg/dL — ABNORMAL HIGH (ref 70–99)

## 2019-03-03 MED ORDER — SODIUM CHLORIDE 0.9 % IV BOLUS
1000.0000 mL | Freq: Once | INTRAVENOUS | Status: AC
  Administered 2019-03-03: 1000 mL via INTRAVENOUS

## 2019-03-03 MED ORDER — SODIUM CHLORIDE 0.9% FLUSH
3.0000 mL | Freq: Once | INTRAVENOUS | Status: AC
  Administered 2019-03-03: 3 mL via INTRAVENOUS

## 2019-03-03 MED ORDER — HALOPERIDOL LACTATE 5 MG/ML IJ SOLN
2.0000 mg | Freq: Once | INTRAMUSCULAR | Status: DC
  Filled 2019-03-03: qty 1

## 2019-03-03 MED ORDER — HALOPERIDOL LACTATE 5 MG/ML IJ SOLN
2.0000 mg | Freq: Once | INTRAMUSCULAR | Status: AC
  Administered 2019-03-03: 2 mg via INTRAMUSCULAR

## 2019-03-03 NOTE — ED Provider Notes (Signed)
[facility] EMERGENCY DEPARTMENT Provider Note   CSN: [id number] Arrival date & time: 03/03/19  0825     History   Chief Complaint Chief Complaint  Patient presents with  . Abdominal Pain    HPI [name] is a 53 y.o. female.     53 y.o female with PMH of DKA, GERD, Diabetic gastroparesis, S/P CABG presents to the ED with a chief complaint of vomiting x yesterday. Patient was seen at [facility] yesterday, had a negative work-up, did have the same symptoms, was sent home with a prescription for Zofran, Phenergan which she has not filled.  She reports multiple episodes of vomiting, also endorses pain along the epigastric region, describing this as a dull sensation which is constant.  Patient is a very poor historian throughout her visit.  She has not tried anything for improvement in symptoms while at home.  She denies any fevers, urinary symptoms, diarrhea.  Patient has not been taking her insulin for her diabetes.  The history is provided by the patient and medical records.  Abdominal Pain Pain location:  Epigastric Pain quality: dull   Pain radiates to:  Does not radiate Pain severity:  Severe Onset quality:  Gradual Duration:  1 day Timing:  Constant Progression:  Worsening Chronicity:  Chronic Associated symptoms: nausea and vomiting   Associated symptoms: no chest pain, no chills, no cough, no diarrhea, no dysuria, no fever, no hematuria, no shortness of breath and no sore throat     Past Medical History:  Diagnosis Date  . AKI (acute kidney injury) ([place])   . Anemia, iron deficiency   . Anxiety and depression 05/18/2015  . CAD (coronary artery disease), native coronary artery 01/11/2018   s/p CABG - Non-STEMI with severe three-vessel disease noted July 2019  . Depression   . Diabetic gastroparesis ([place])   . Diabetic neuropathy, type I diabetes mellitus ([place]) 05/18/2015  . DKA, type 1 ([place]) 03/18/2016  . Essential hypertension   .  Gastroparesis   . GERD (gastroesophageal reflux disease)   . HLD (hyperlipidemia)   . MDD (major depressive disorder), single episode, severe , no psychosis ([place])   . Tardive dyskinesia   . Vitamin B12 deficiency 08/16/2015    Patient Active Problem List   Diagnosis Date Noted  . Acute encephalopathy 12/22/2018  . Diabetic ketoacidosis ([place]) 04/30/2018  . Gastroparesis 04/13/2018  . DKA (diabetic ketoacidosis) ([place]) 03/31/2018  . S/P CABG x 3 01/14/2018  . CAD (coronary artery disease), native coronary artery 01/11/2018  . Dehydration   . MDD (major depressive disorder), single episode, severe , no psychosis ([place])   . DKA (diabetic ketoacidoses) ([place]) 06/16/2017  . Tardive dyskinesia   . DKA, type 1 ([place]) 03/18/2016  . Noncompliance with treatment plan 03/13/2016  . Essential hypertension 01/24/2016  . HLD (hyperlipidemia)   . GERD (gastroesophageal reflux disease)   . AKI (acute kidney injury) ([place])   . Anemia, iron deficiency   . Vitamin B12 deficiency 08/16/2015  . Diabetic gastroparesis ([place])   . Diabetic neuropathy, type I diabetes mellitus ([place]) 05/18/2015  . Anxiety and depression 05/18/2015    Past Surgical History:  Procedure Laterality Date  . CARDIAC SURGERY    . COLONOSCOPY  09/27/2014   at [facility]  . CORONARY ARTERY BYPASS GRAFT N/A 01/14/2018   Procedure: CORONARY ARTERY BYPASS GRAFTING (CABG)X3, RIGHT AND LEFT SAPHENOUS VEIN HARVEST, MAMMARY TAKE DOWN. MAMMARY TO LAD, SVG TO PD, SVG TO DISTAL CIRC.;  Surgeon: Servando Snare,  Lilia Argue, MD;  Location: [facility];  Service: Open Heart Surgery;  Laterality: N/A;  . ESOPHAGOGASTRODUODENOSCOPY  09/27/2014   at [facility], Dr Rolan Lipa. biospy neg for celiac, neg for H pylori.   . EYE SURGERY    . gailstones    . IR FLUORO GUIDE CV LINE RIGHT  02/01/2017  . IR FLUORO GUIDE CV LINE RIGHT  03/06/2017  . IR FLUORO GUIDE CV LINE RIGHT  03/25/2017  . IR GENERIC HISTORICAL  01/24/2016   IR FLUORO GUIDE CV LINE RIGHT 01/24/2016 Darrell K  Allred, PA-C [facility]  . IR GENERIC HISTORICAL  01/24/2016   IR US GUIDE VASC ACCESS RIGHT 01/24/2016 Darrell K Allred, PA-C [facility]  . IR US GUIDE VASC ACCESS RIGHT  02/01/2017  . IR US GUIDE VASC ACCESS RIGHT  03/06/2017  . IR US GUIDE VASC ACCESS RIGHT  03/25/2017  . IR US GUIDE VASC ACCESS RIGHT  01/20/2019  . IR VENIPUNCTURE 4YRS/OLDER BY MD  01/20/2019  . LEFT HEART CATH AND CORONARY ANGIOGRAPHY N/A 01/07/2018   Procedure: LEFT HEART CATH AND CORONARY ANGIOGRAPHY;  Surgeon: Troy Sine, MD;  Location: [place] CV LAB;  Service: Cardiovascular;  Laterality: N/A;  . POSTERIOR VITRECTOMY AND MEMBRANE PEEL-LEFT EYE  09/28/2002  . POSTERIOR VITRECTOMY AND MEMBRANE PEEL-RIGHT EYE  03/16/2002  . RETINAL DETACHMENT SURGERY    . TEE WITHOUT CARDIOVERSION N/A 01/14/2018   Procedure: TRANSESOPHAGEAL ECHOCARDIOGRAM (TEE);  Surgeon: Grace Isaac, MD;  Location: [place];  Service: Open Heart Surgery;  Laterality: N/A;     OB History    Gravida  2   Para      Term      Preterm      AB  1   Living  1     SAB      TAB  1   Ectopic      Multiple      Live Births               Home Medications    Prior to Admission medications   Medication Sig Start Date End Date Taking? Authorizing Provider  ammonium lactate (AMLACTIN) 12 % cream Apply topically as needed for dry skin. 07/08/18   Trula Slade, DPM  aspirin EC 81 MG tablet Take 1 tablet (81 mg total) by mouth daily. 06/03/18   Nahser, Wonda Cheng, MD  Blood Glucose Monitoring Suppl (ONETOUCH VERIO) w/Device KIT Use as directed 03/21/18   [name], MD  busPIRone (BUSPAR) 10 MG tablet Take 1 tablet (10 mg total) by mouth 3 (three) times daily. 06/12/18   [name], MD  docusate sodium (COLACE) 100 MG capsule Take 1 capsule (100 mg total) by mouth daily as needed for mild constipation. 02/03/18   Burtis Junes, NP  glucose blood (ACCU-CHEK AVIVA PLUS) test strip Use as instructed to test blood sugar 4  times daily Dx E10.65 12/18/18   Elayne Snare, MD  insulin aspart (NOVOLOG) 100 UNIT/ML injection Inject 0-12 Units into the skin 3 (three) times daily before meals. As per sliding scale Patient taking differently: Inject 4-14 Units into the skin 3 (three) times daily before meals. As per sliding scale 10/14/18   Elayne Snare, MD  Insulin Glargine (LANTUS SOLOSTAR) 100 UNIT/ML Solostar Pen Inject 15 units under the skin once daily in the morning. 10/14/18   Elayne Snare, MD  Insulin Syringe-Needle U-100 (BD INSULIN SYRINGE ULTRAFINE) 31G X 5/16" 0.5 ML MISC 1 each by  Does not apply route 4 (four) times daily. 04/28/18   Elayne Snare, MD  Lancets [facility] DELICA PLUS [id number]) [place] 1 Units by Does not apply route as directed. 03/21/18   [name], MD  loperamide (IMODIUM) 2 MG capsule Take 1 capsule (2 mg total) by mouth 3 (three) times daily as needed for diarrhea or loose stools. 12/25/18   Aline August, MD  metoprolol tartrate (LOPRESSOR) 25 MG tablet Take 0.5 tablets (12.5 mg total) by mouth 2 (two) times daily. 10/08/18   Nahser, Wonda Cheng, MD  ondansetron (ZOFRAN ODT) 4 MG disintegrating tablet Take 1 tablet (4 mg total) by mouth every 6 (six) hours as needed for nausea or vomiting. 03/02/19   Ward, Delice Bison, DO  pantoprazole (PROTONIX) 40 MG tablet Take 1 tablet (40 mg total) by mouth daily. 06/12/18   [name], MD  pregabalin (LYRICA) 150 MG capsule Take 1 capsule (150 mg total) by mouth 2 (two) times daily. 01/02/19   Elayne Snare, MD  promethazine (PHENERGAN) 25 MG tablet Take 1 tablet (25 mg total) by mouth every 6 (six) hours as needed for nausea or vomiting. 03/02/19   Ward, Delice Bison, DO  pyridostigmine (MESTINON) 60 MG tablet Take 60 mg by mouth 3 (three) times daily.    [provider]  senna-docusate (SENOKOT-S) 8.6-50 MG tablet Take 1 tablet by mouth daily as needed for mild constipation. 01/28/19   Ladell Pier, MD    Family History Family History  Problem Relation Age  of Onset  . Cystic fibrosis Mother   . Hypertension Father   . Diabetes Brother   . Hypertension Maternal Grandmother     Social History Social History   Tobacco Use  . Smoking status: Never Smoker  . Smokeless tobacco: Never Used  Substance Use Topics  . Alcohol use: No  . Drug use: Yes    Types: Marijuana    Comment: occ     Allergies   Anesthetics, amide; Penicillins; Buprenorphine hcl; Encainide; and Metoclopramide   Review of Systems Review of Systems  Constitutional: Negative for chills and fever.  HENT: Negative for ear pain and sore throat.   Eyes: Negative for pain and visual disturbance.  Respiratory: Negative for cough and shortness of breath.   Cardiovascular: Negative for chest pain and palpitations.  Gastrointestinal: Positive for abdominal pain, nausea and vomiting. Negative for diarrhea.  Genitourinary: Negative for dysuria, flank pain and hematuria.  Musculoskeletal: Negative for arthralgias and back pain.  Skin: Negative for color change and rash.  Neurological: Negative for seizures and syncope.  All other systems reviewed and are negative.    Physical Exam Updated Vital Signs BP (!) 169/87   Pulse (!) 107   Temp 98.2 F (36.8 C) (Oral)   Resp 16   Ht 5' 2"  (1.575 m)   Wt 73.5 kg   SpO2 100%   BMI 29.63 kg/m   Physical Exam Vitals signs and nursing note reviewed.  Constitutional:      General: She is not in acute distress.    Appearance: She is well-developed. She is ill-appearing and diaphoretic.  HENT:     Head: Normocephalic and atraumatic.     Mouth/Throat:     Pharynx: No oropharyngeal exudate.  Eyes:     Pupils: Pupils are equal, round, and reactive to light.  Neck:     Musculoskeletal: Normal range of motion.  Cardiovascular:     Rate and Rhythm: Regular rhythm.     Heart sounds: Normal  heart sounds.  Pulmonary:     Effort: Pulmonary effort is normal. No respiratory distress.     Breath sounds: Normal breath sounds.   Abdominal:     General: Bowel sounds are increased. There is no distension.     Palpations: Abdomen is soft.     Tenderness: There is abdominal tenderness in the epigastric area. There is no right CVA tenderness, left CVA tenderness, guarding or rebound.     Hernia: No hernia is present.  Musculoskeletal:        General: No tenderness or deformity.     Right lower leg: No edema.     Left lower leg: No edema.  Skin:    General: Skin is warm.  Neurological:     Mental Status: She is alert and oriented to person, place, and time.      ED Treatments / Results  Labs (all labs ordered are listed, but only abnormal results are displayed) Labs Reviewed  CBC - Abnormal; Notable for the following components:      Result Value   Hemoglobin 10.2 (*)    HCT 33.8 (*)    MCV 79.7 (*)    MCH 24.1 (*)    RDW 15.6 (*)    All other components within normal limits  URINALYSIS, ROUTINE W REFLEX MICROSCOPIC - Abnormal; Notable for the following components:   Glucose, UA >=500 (*)    Hgb urine dipstick SMALL (*)    Ketones, ur 80 (*)    Protein, ur 100 (*)    Bacteria, UA RARE (*)    All other components within normal limits  COMPREHENSIVE METABOLIC PANEL - Abnormal; Notable for the following components:   CO2 17 (*)    Glucose, Bld 310 (*)    Creatinine, Ser 1.18 (*)    Total Bilirubin 1.3 (*)    GFR calc non Af Amer 53 (*)    All other components within normal limits  CBG MONITORING, ED - Abnormal; Notable for the following components:   Glucose-Capillary 272 (*)    All other components within normal limits  LIPASE, BLOOD  TROPONIN I (HIGH SENSITIVITY)    EKG None  Radiology Dg Chest Portable 1 View  Result Date: 03/02/2019 CLINICAL DATA:  Shortness of breath EXAM: PORTABLE CHEST 1 VIEW COMPARISON:  02/13/2019 FINDINGS: Prior CABG. Heart and mediastinal contours are within normal limits. No focal opacities or effusions. No acute bony abnormality. IMPRESSION: No active disease.  Electronically Signed   By: Rolm Baptise M.D.   On: 03/02/2019 02:43    Procedures Procedures (including critical care time)  Medications Ordered in ED Medications  sodium chloride flush (NS) 0.9 % injection 3 mL (3 mLs Intravenous Given by Other 03/03/19 1132)  sodium chloride 0.9 % bolus 1,000 mL (0 mLs Intravenous Stopped 03/03/19 1300)  haloperidol lactate (HALDOL) injection 2 mg (2 mg Intramuscular Given 03/03/19 1013)  sodium chloride 0.9 % bolus 1,000 mL (1,000 mLs Intravenous New Bag/Given 03/03/19 1434)     Initial Impression / Assessment and Plan / ED Course  I have reviewed the triage vital signs and the nursing notes.  Pertinent labs & imaging results that were available during my care of the patient were reviewed by me and considered in my medical decision making (see chart for details).       Patient with a past medical history of CABG, diabetes presents to the ED with complaints of nausea, vomiting since yesterday.  Patient was seen in the ED yesterday, reported  she had multiple episodes of vomiting.  Patient was seen at [facility] long ED yesterday, reports she was sent home with a prescription for Zofran along with Phenergan which she has not filled.  Patient reports has not been able to keep anything down, is a very poor historian when asking about her medical history.  Heart rate does appear elevated in the 120s, will provide her with fluids, Haldol, EKG without any QT prolongation's.  CBG was 272, she reports she has not been taking her insulin.  CBC without any leukocytosis, hemoglobin slightly decreased which is consistent with patient's previous visit.  CBC without any electrolyte derangement, does have an elevated glucose at 310.  Creatinine level slightly elevated at 1.18, this is improved from her previous visits.  LFTs are unremarkable.  No anion gap, low suspicion for any DKA.  UA did show some protein, ketones, rare bacteria and no white blood cell count.  Lipase level is  within normal limits.  3:09 PM patient has not had any vomiting episodes while in the ED, has had 3 L of fluid with improvement in her heart rate.  She remains afebrile, will p.o. challenge her prior to reconsulting hospitalist for admission.  3:55 PM patient has completed p.o. challenge successfully, reports improvement of pain in her abdomen, has been tolerating p.o. without any further episodes of vomiting.  Advised to fill prescriptions for Zofran and Phenergan which were given to her during yesterday's visit at [facility] long.  Patient shows no agrees with management.  Ambulatory with a steady gait in the ED.  Return precautions provided at length.   Portions of this note were generated with Lobbyist. Dictation errors may occur despite best attempts at proofreading. Final Clinical Impressions(s) / ED Diagnoses   Final diagnoses:  Diabetic gastroparesis [facility])    ED Discharge Orders    None       Janeece Fitting, PA-C 03/03/19 1556    Daleen Bo, MD 03/04/19 [id number]

## 2019-03-03 NOTE — Progress Notes (Signed)
      [name] [id number] DOB: Apr 25, 1966 DOA: 03/03/2019  PCP: [name], MD Consultants:  Dwyane Dee - endocrinology; Nahser - cardiology   Chief Complaint: Abdominal pain  HPI: [name] is a 53 y.o. female with medical history significant of tardive dyskinesia; CAD s/p CABG; HLD; HTN; DM with h/o DKA, gastroparesis, and neuropathy presenting with abdominal pain.  She was seen last night at [facility] and was not thought to require admission; symptoms were thought to be related to gastroparesis.  She was discharged to home and returned to the [facility] ER.    Patient was previously admitted 8/21-23 with DKA.  ED Course:  H/o gastroparesis, seen overnight at [facility].  Did not fill rx, not taking meds.  Continues to c/o abdominal pain, not vomiting.  Unwilling to go home.   EKG: Independently reviewed.  NSR with rate 97; no evidence of acute ischemia   Labs on Admission: I have personally reviewed the available labs and imaging studies at the time of the admission.  Pertinent labs:   CO2 17; 23 at 0127 Glucose 310 BUN 12/Creatinine 1.18/GFR >60 - improved from 0127 Anion gap 15; 10 at 0127 Bili 1.3 HS troponin 7 WBC 9.0 Hgb 10.2 UA: >500 glucose, small Hgb, 80 ketones, 100 protein, rare bacteria UDS negative    Patient discussed with PA Soto.  Recommend 2L IVF and trial of PO before deciding if patient requires overnight observation - currently she does not appear to meet criteria without trial of therapy in the ER.    Karmen Bongo MD [facility]   How to contact the [facility] Attending or Consulting provider [place] or covering provider during after hours [place], for this patient?  1. Check the care team in [facility] and look for a) attending/consulting [facility] provider listed and b) the [facility] team listed 2. Log into [email or web address] and use [place]'s universal password to access. If you do not have the password, please contact the hospital operator. 3. Locate the [facility] provider  you are looking for under [facility] and page to a number that you can be directly reached. 4. If you still have difficulty reaching the provider, please page the [facility] (Director on Call) for the Hospitalists listed on amion for assistance.   03/03/2019, 2:08 PM

## 2019-03-03 NOTE — ED Notes (Signed)
PO challenge given  

## 2019-03-03 NOTE — ED Notes (Signed)
Patient verbalizes understanding of discharge instructions. Opportunity for questioning and answers were provided. Armband removed by staff, pt discharged from ED.  

## 2019-03-03 NOTE — ED Notes (Signed)
Got patient [name] it was 65 notified RN of blood sugar

## 2019-03-03 NOTE — ED Notes (Signed)
Pt was in lobby and asked to go to bathroom. Pt was brought to bathroom and refused to walk to toilet. Pt was brought to room 45 in yellow zone and needed NO assistance walking to bed. Pt had steady gait when ambulating.

## 2019-03-03 NOTE — ED Triage Notes (Signed)
Pt here from abd pain was just at [facility] and discharge home , pt will talk to RN in triage

## 2019-03-03 NOTE — Discharge Instructions (Signed)
Your labs today were within normal limits.  Please fill your prescription for Zofran and Phenergan which were given to you at [facility] long yesterday.  Please follow-up with your primary care physician for further management of your diabetic gastroparesis.  Please continue compliance with medication to help with your glucose.

## 2019-03-05 NOTE — Progress Notes (Signed)
Cardiology Office Note:    Date:  03/06/2019   ID:  [name], DOB Oct 29, 1965, MRN [id number]  PCP:  [name], MD  Cardiologist:  Mertie Moores, MD   Referring MD: [name], MD   Problem list 1.  Coronary artery disease-status post coronary artery bypass grafting 2.  Poorly controlled diabetes mellitus-complicated by noncompliance and neuropathy 3.  Hyperlipidemia  Chief Complaint  Patient presents with  . Coronary Artery Disease     Sept. 10, 2019   [name] is a 53 y.o. female with a hx of poorly controlled diabetes mellitus and coronary artery disease.  She is status post coronary artery bypass grafting on January 14, 2018: She underwent CABG x 3 utilizing LIMA to LAD, SVG to PDA, and SVG to OM.  She is been to the emergency room over 50 times in the past year for diabetic ketoacidosis and gastroparesis. She was in the emergency room on September 1 with intractable nausea and vomiting.  She was not in DKA at that time.  Seen in the office for the first time today . Diabetes is under fairly good control currently   Has lots of issues with gastroparesis   Sept. 11, 2020  [name] is seen today for follow up visit Having issues with gastroparesis  No CP .    Past Medical History:  Diagnosis Date  . AKI (acute kidney injury) ([place])   . Anemia, iron deficiency   . Anxiety and depression 05/18/2015  . CAD (coronary artery disease), native coronary artery 01/11/2018   s/p CABG - Non-STEMI with severe three-vessel disease noted July 2019  . Depression   . Diabetic gastroparesis ([place])   . Diabetic neuropathy, type I diabetes mellitus ([place]) 05/18/2015  . DKA, type 1 ([place]) 03/18/2016  . Essential hypertension   . Gastroparesis   . GERD (gastroesophageal reflux disease)   . HLD (hyperlipidemia)   . MDD (major depressive disorder), single episode, severe , no psychosis ([place])   . Tardive dyskinesia   . Vitamin B12 deficiency 08/16/2015    Past  Surgical History:  Procedure Laterality Date  . CARDIAC SURGERY    . COLONOSCOPY  09/27/2014   at [facility]  . CORONARY ARTERY BYPASS GRAFT N/A 01/14/2018   Procedure: CORONARY ARTERY BYPASS GRAFTING (CABG)X3, RIGHT AND LEFT SAPHENOUS VEIN HARVEST, MAMMARY TAKE DOWN. MAMMARY TO LAD, SVG TO PD, SVG TO DISTAL CIRC.;  Surgeon: Grace Isaac, MD;  Location: [place];  Service: Open Heart Surgery;  Laterality: N/A;  . ESOPHAGOGASTRODUODENOSCOPY  09/27/2014   at [facility], Dr Rolan Lipa. biospy neg for celiac, neg for H pylori.   . EYE SURGERY    . gailstones    . IR FLUORO GUIDE CV LINE RIGHT  02/01/2017  . IR FLUORO GUIDE CV LINE RIGHT  03/06/2017  . IR FLUORO GUIDE CV LINE RIGHT  03/25/2017  . IR GENERIC HISTORICAL  01/24/2016   IR FLUORO GUIDE CV LINE RIGHT 01/24/2016 Darrell K Allred, PA-C [facility]  . IR GENERIC HISTORICAL  01/24/2016   IR US GUIDE VASC ACCESS RIGHT 01/24/2016 Darrell K Allred, PA-C [facility]  . IR US GUIDE VASC ACCESS RIGHT  02/01/2017  . IR US GUIDE VASC ACCESS RIGHT  03/06/2017  . IR US GUIDE VASC ACCESS RIGHT  03/25/2017  . IR US GUIDE VASC ACCESS RIGHT  01/20/2019  . IR VENIPUNCTURE 21YRS/OLDER BY MD  01/20/2019  . LEFT HEART CATH AND CORONARY ANGIOGRAPHY N/A 01/07/2018   Procedure: LEFT HEART  CATH AND CORONARY ANGIOGRAPHY;  Surgeon: Troy Sine, MD;  Location: [place] [facility];  Service: Cardiovascular;  Laterality: N/A;  . POSTERIOR VITRECTOMY AND MEMBRANE PEEL-LEFT EYE  09/28/2002  . POSTERIOR VITRECTOMY AND MEMBRANE PEEL-RIGHT EYE  03/16/2002  . RETINAL DETACHMENT SURGERY    . TEE WITHOUT CARDIOVERSION N/A 01/14/2018   Procedure: TRANSESOPHAGEAL ECHOCARDIOGRAM (TEE);  Surgeon: Grace Isaac, MD;  Location: [place];  Service: Open Heart Surgery;  Laterality: N/A;    Current Medications: Current Meds  Medication Sig  . ammonium lactate (AMLACTIN) 12 % cream Apply topically as needed for dry skin.  Marland Kitchen aspirin EC 81 MG tablet Take 1 tablet (81 mg total) by mouth daily.   . Blood Glucose Monitoring Suppl (ONETOUCH VERIO) w/Device KIT Use as directed  . busPIRone (BUSPAR) 10 MG tablet Take 1 tablet (10 mg total) by mouth 3 (three) times daily.  Marland Kitchen docusate sodium (COLACE) 100 MG capsule Take 1 capsule (100 mg total) by mouth daily as needed for mild constipation.  Marland Kitchen glucose blood (ACCU-CHEK AVIVA PLUS) test strip Use as instructed to test blood sugar 4 times daily Dx E10.65  . insulin aspart (NOVOLOG) 100 UNIT/ML injection Inject 0-12 Units into the skin 3 (three) times daily before meals. As per sliding scale  . Insulin Glargine (LANTUS SOLOSTAR) 100 UNIT/ML Solostar Pen Inject 15 units under the skin once daily in the morning.  . Insulin Syringe-Needle U-100 (BD INSULIN SYRINGE ULTRAFINE) 31G X 5/16" 0.5 ML MISC 1 each by Does not apply route 4 (four) times daily.  . Lancets (ONETOUCH DELICA PLUS QBVQXI50T) MISC 1 Units by Does not apply route as directed.  . loperamide (IMODIUM) 2 MG capsule Take 1 capsule (2 mg total) by mouth 3 (three) times daily as needed for diarrhea or loose stools.  . metoprolol tartrate (LOPRESSOR) 25 MG tablet Take 0.5 tablets (12.5 mg total) by mouth 2 (two) times daily.  . ondansetron (ZOFRAN ODT) 4 MG disintegrating tablet Take 1 tablet (4 mg total) by mouth every 6 (six) hours as needed for nausea or vomiting.  . pantoprazole (PROTONIX) 40 MG tablet Take 1 tablet (40 mg total) by mouth daily.  . pregabalin (LYRICA) 150 MG capsule Take 1 capsule (150 mg total) by mouth 2 (two) times daily.  . promethazine (PHENERGAN) 25 MG tablet Take 1 tablet (25 mg total) by mouth every 6 (six) hours as needed for nausea or vomiting.  . pyridostigmine (MESTINON) 60 MG tablet Take 60 mg by mouth 3 (three) times daily.     Allergies:   Anesthetics, amide; Penicillins; Buprenorphine hcl; Encainide; and Metoclopramide   Social History   Socioeconomic History  . Marital status: Single    Spouse name: Not on file  . Number of children: 1  . Years of  education: Not on file  . Highest education level: Not on file  Occupational History  . Occupation: Unemployed  Social Needs  . Financial resource strain: Not on file  . Food insecurity    Worry: Not on file    Inability: Not on file  . Transportation needs    Medical: Not on file    Non-medical: Not on file  Tobacco Use  . Smoking status: Never Smoker  . Smokeless tobacco: Never Used  Substance and Sexual Activity  . Alcohol use: No  . Drug use: Yes    Types: Marijuana    Comment: occ  . Sexual activity: Yes    Partners: Male  Birth control/protection: None  Lifestyle  . Physical activity    Days per week: Not on file    Minutes per session: Not on file  . Stress: Not on file  Relationships  . Social Herbalist on phone: Not on file    Gets together: Not on file    Attends religious service: Not on file    Active member of club or organization: Not on file    Attends meetings of clubs or organizations: Not on file    Relationship status: Not on file  Other Topics Concern  . Not on file  Social History Narrative  . Not on file     Family History: The patient's family history includes Cystic fibrosis in her mother; Diabetes in her brother; Hypertension in her father and maternal grandmother.  ROS:   Please see the history of present illness.     All other systems reviewed and are negative.  EKGs/Labs/Other Studies Reviewed:    The following studies were reviewed today:   EKG:     Recent Labs: 11/05/2018: TSH 1.110 01/22/2019: Magnesium 1.7 03/03/2019: ALT 13; BUN 12; Creatinine, Ser 1.18; Hemoglobin 10.2; Platelets 373; Potassium 3.8; Sodium 140  Recent Lipid Panel    Component Value Date/Time   CHOL 121 01/07/2018 0510   TRIG 101 01/07/2018 0510   HDL 47 01/07/2018 0510   CHOLHDL 2.6 01/07/2018 0510   VLDL 20 01/07/2018 0510   LDLCALC 54 01/07/2018 0510     Physical Exam: Blood pressure 122/62, pulse 79, height _0  (1.575 m), weight  161 lb 3.2 oz (73.1 kg), SpO2 99 %.  GEN:  chronically ill appearing female.  HEENT: Normal NECK: No JVD; No carotid bruits LYMPHATICS: No lymphadenopathy CARDIAC: RRR , no murmurs, rubs, gallops RESPIRATORY:  Clear to auscultation without rales, wheezing or rhonchi  ABDOMEN: Soft, non-tender, non-distended MUSCULOSKELETAL:  No edema; No deformity  SKIN: tight , ( diabetic scleraderma )  NEUROLOGIC:  Alert and oriented x 3   ASSESSMENT:    No diagnosis found. PLAN:    In order of problems listed above:  1.  Coronary artery disease: no angina .   Continue meds.    2.  Leg edema : largely resolved.   3.  Diabetes:   Has diabetic sclerederma.   Encouraged her to work on better diabetic control   Medication Adjustments/Labs and Tests Ordered: Current medicines are reviewed at length with the patient today.  Concerns regarding medicines are outlined above.  No orders of the defined types were placed in this encounter.  Meds ordered this encounter  Medications  . losartan (COZAAR) 25 MG tablet    Sig: Take 1 tablet (25 mg total) by mouth daily.    Dispense:  90 tablet    Refill:  3    Patient Instructions  Medication Instructions:  Your physician recommends that you continue on your current medications as directed. Please refer to the Current Medication list given to you today.  If you need a refill on your cardiac medications before your next appointment, please call your pharmacy.   Lab work: None Ordered If you have labs (blood work) drawn today and your tests are completely normal, you will receive your results only by: Marland Kitchen MyChart Message (if you have MyChart) OR . A paper copy in the mail If you have any lab test that is abnormal or we need to change your treatment, we will call you to review the results.  Testing/Procedures:  None Ordered  Follow-Up: At [facility], you and your health needs are our priority.  As part of our continuing mission to provide you  with exceptional heart care, we have created designated Provider Care Teams.  These Care Teams include your primary Cardiologist (physician) and Advanced Practice Providers (APPs -  Physician Assistants and Nurse Practitioners) who all work together to provide you with the care you need, when you need it. You will need a follow up appointment in:  6 months.  Please call our office 2 months in advance to schedule this appointment.  You may see one of the following Advanced Practice Providers on your designated Care Team: Richardson Dopp, PA-C [place], [place] . Daune Perch, NP  Any Other Special Instructions Will Be Listed Below (If Applicable).       Signed, Mertie Moores, MD  03/06/2019 2:16 PM    [facility]

## 2019-03-06 ENCOUNTER — Other Ambulatory Visit: Payer: Self-pay

## 2019-03-06 ENCOUNTER — Encounter: Payer: Self-pay | Admitting: Cardiovascular Disease

## 2019-03-06 ENCOUNTER — Ambulatory Visit (INDEPENDENT_AMBULATORY_CARE_PROVIDER_SITE_OTHER): Payer: Medicare HMO | Admitting: Cardiovascular Disease

## 2019-03-06 VITALS — BP 122/62 | HR 79 | Ht 62.0 in | Wt 161.2 lb

## 2019-03-06 DIAGNOSIS — I10 Essential (primary) hypertension: Secondary | ICD-10-CM

## 2019-03-06 DIAGNOSIS — I2510 Atherosclerotic heart disease of native coronary artery without angina pectoris: Secondary | ICD-10-CM | POA: Diagnosis not present

## 2019-03-06 MED ORDER — LOSARTAN POTASSIUM 25 MG PO TABS: 25.0000 mg | ORAL_TABLET | Freq: Every day | ORAL | 3 refills | Status: DC

## 2019-03-06 NOTE — Patient Instructions (Signed)
Medication Instructions:  Your physician recommends that you continue on your current medications as directed. Please refer to the Current Medication list given to you today.  If you need a refill on your cardiac medications before your next appointment, please call your pharmacy.   Lab work: None Ordered If you have labs (blood work) drawn today and your tests are completely normal, you will receive your results only by: Marland Kitchen MyChart Message (if you have MyChart) OR . A paper copy in the mail If you have any lab test that is abnormal or we need to change your treatment, we will call you to review the results.  Testing/Procedures: None Ordered  Follow-Up: At [facility], you and your health needs are our priority.  As part of our continuing mission to provide you with exceptional heart care, we have created designated Provider Care Teams.  These Care Teams include your primary Cardiologist (physician) and Advanced Practice Providers (APPs -  Physician Assistants and Nurse Practitioners) who all work together to provide you with the care you need, when you need it. You will need a follow up appointment in:  6 months.  Please call our office 2 months in advance to schedule this appointment.  You may see one of the following Advanced Practice Providers on your designated Care Team: Richardson Dopp, PA-C [place], [place] . Daune Perch, NP  Any Other Special Instructions Will Be Listed Below (If Applicable).

## 2019-03-30 ENCOUNTER — Ambulatory Visit: Payer: Medicare HMO | Admitting: Endocrinology

## 2019-04-15 ENCOUNTER — Other Ambulatory Visit: Payer: Self-pay

## 2019-04-15 ENCOUNTER — Encounter: Payer: Self-pay | Admitting: Endocrinology

## 2019-04-15 ENCOUNTER — Ambulatory Visit (INDEPENDENT_AMBULATORY_CARE_PROVIDER_SITE_OTHER): Payer: Medicare HMO | Admitting: Endocrinology

## 2019-04-15 VITALS — BP 110/60 | HR 73 | Ht 62.0 in | Wt 165.2 lb

## 2019-04-15 DIAGNOSIS — R809 Proteinuria, unspecified: Secondary | ICD-10-CM | POA: Diagnosis not present

## 2019-04-15 DIAGNOSIS — E7800 Pure hypercholesterolemia, unspecified: Secondary | ICD-10-CM

## 2019-04-15 DIAGNOSIS — E1065 Type 1 diabetes mellitus with hyperglycemia: Secondary | ICD-10-CM | POA: Diagnosis not present

## 2019-04-15 LAB — POCT GLYCOSYLATED HEMOGLOBIN (HGB A1C): Hemoglobin A1C: 8.8 % — AB (ref 4.0–5.6)

## 2019-04-15 NOTE — Progress Notes (Signed)
Patient ID: [name], female   DOB: 06/04/1966, 53 y.o.   MRN: [id number]           Reason for Appointment : Follow-up for Type 1 Diabetes  History of Present Illness   Referring HCP: Dr. Margarita Rana          Diagnosis: Type 1 diabetes mellitus, date of diagnosis: 1977         Previous history:  She has mostly been followed by primary care physicians for her diabetes with consistently poor control She has had periodic episodes of ketoacidosis  Recent history:   INSULIN regimen: BASAL insulin: LANTUS 15 units in a.m.  MEALTIME insulin: Novolog variable doses: Usually 6-8 at breakfast, 2-3 at lunch and dinner  A1c is now 8.8, previously was at 9.8  Current management, blood sugar patterns and problems identified:    She did not bring her monitor for download on her last visit but now her meter appears to be programmed 12 hours behind in time and difficult to interpret her download  She has had another episode of ketoacidosis in August since her last visit in June  She was told to try [place] but she claims she gained 20 pounds with this and switched back to Lantus  FASTING blood service are quite variable although on an average moderately high  She is not understanding how to adjust her NovoLog insulin or based on what she is eating or doing a correction factor  Despite eating practically low carbohydrate at breakfast she takes 14 units of NovoLog now instead of 6 or 8 units that she was doing previously  Also will not adjust her NovoLog in the morning based on premeal blood sugar  Blood sugars are also being checked in the afternoon and these are quite variable  Also has relatively low readings at suppertime from time to time  She does not at times get any protein in the evening since she thinks that she needs to eat relatively low carbohydrate at dinnertime because of her gastroparesis  Does not do any exercise regularly  With this she is getting HYPOGLYCEMIA  periodically about 4 to 5 hours later including today in the office.  Highest blood sugars on an average are still after dinner but also sometimes in the early afternoon depending on her premeal blood sugar in the morning  Last evening she did not take any insulin to cover her evening meal since blood sugar had been lower earlier that day  Otherwise does not appear to have significantly high readings after dinner which she does not take often She has not been started on CGM since she has not had a documentation of checking her blood sugar 4 times a day.  She did not appear to be aware of availability of CGM  Glucose monitoring:  is being done 3 times a day         Glucometer: Accu-Chek       Blood Glucose readings from download:   PRE-MEAL Fasting Lunch  afternoon Bedtime Overall  Glucose range:  67-296   44-330    Mean/median:  166   191     POST-MEAL PC Breakfast PC Lunch PC Dinner  Glucose range:    122-338  Mean/median:    220   Previous readings:  PRE-MEAL Fasting Lunch Dinner Bedtime Overall  Glucose range:  67-197  79-402  117-222 ?   Mean/median:     ?    Symptoms of hypoglycemia: Weakness, sometimes none Treatment of  hypoglycemia: Snacks         Self-care: The diet that the patient has been following is: None  Mealtimes are: Breakfast egg, cheese, sausage, occasionally grits, toast;   Lunch: Sandwich or only snacks, dinner: baked chricken, rice, potatoes                Dietician consultation: Most recent: years ago.         CDE consultation: 04/2018  Wt Readings from Last 3 Encounters:  04/15/19 165 lb 3.2 oz (74.9 kg)  03/06/19 161 lb 3.2 oz (73.1 kg)  03/03/19 162 lb (73.5 kg)   Diabetes labs:  Lab Results  Component Value Date   HGBA1C 8.8 (A) 04/15/2019   HGBA1C 9.8 (H) 12/15/2018   HGBA1C 8.1 (A) 04/28/2018   Lab Results  Component Value Date   MICROALBUR 14.3 (H) 04/28/2018   LDLCALC 54 01/07/2018   CREATININE 1.18 (H) 03/03/2019    Lab  Results  Component Value Date   MICRALBCREAT 17.1 04/28/2018   MICRALBCREAT 254 (H) 09/04/2016     Allergies as of 04/15/2019      Reactions   Anesthetics, Amide Nausea And Vomiting   Penicillins Diarrhea, Nausea And Vomiting, Other (See Comments)   Has patient had a PCN reaction causing immediate rash, facial/tongue/throat swelling, SOB or lightheadedness with hypotension: Yes Has patient had a PCN reaction causing severe rash involving mucus membranes or skin necrosis: No Has patient had a PCN reaction that required hospitalization No Has patient had a PCN reaction occurring within the last 10 years: Yes  If all of the above answers are "NO", then may proceed with Cephalosporin use.   Buprenorphine Hcl Rash   Encainide Nausea And Vomiting   Metoclopramide Other (See Comments)   Dystonia, muscle rigidity.  Patient states she is only allergic to the "pill form, not the IV"      Medication List       Accurate as of April 15, 2019  8:31 PM. If you have any questions, ask your nurse or doctor.        Accu-Chek Aviva Plus test strip Generic drug: glucose blood Use as instructed to test blood sugar 4 times daily Dx E10.65   ammonium lactate 12 % cream Commonly known as: AMLACTIN Apply topically as needed for dry skin.   aspirin EC 81 MG tablet Take 1 tablet (81 mg total) by mouth daily.   busPIRone 10 MG tablet Commonly known as: BUSPAR Take 1 tablet (10 mg total) by mouth 3 (three) times daily.   docusate sodium 100 MG capsule Commonly known as: Colace Take 1 capsule (100 mg total) by mouth daily as needed for mild constipation.   insulin aspart 100 UNIT/ML injection Commonly known as: novoLOG Inject 0-12 Units into the skin 3 (three) times daily before meals. As per sliding scale   Insulin Glargine 100 UNIT/ML Solostar Pen Commonly known as: Lantus SoloStar Inject 15 units under the skin once daily in the morning.   Insulin Syringe-Needle U-100 31G X 5/16" 0.5  ML Misc Commonly known as: BD Insulin Syringe Ultrafine 1 each by Does not apply route 4 (four) times daily.   loperamide 2 MG capsule Commonly known as: IMODIUM Take 1 capsule (2 mg total) by mouth 3 (three) times daily as needed for diarrhea or loose stools.   losartan 25 MG tablet Commonly known as: COZAAR Take 1 tablet (25 mg total) by mouth daily.   metoprolol tartrate 25 MG tablet Commonly known as: LOPRESSOR  Take 0.5 tablets (12.5 mg total) by mouth 2 (two) times daily.   ondansetron 4 MG disintegrating tablet Commonly known as: Zofran ODT Take 1 tablet (4 mg total) by mouth every 6 (six) hours as needed for nausea or vomiting.   OneTouch Delica Plus [id number] Misc 1 Units by Does not apply route as directed.   OneTouch Verio w/Device Kit Use as directed   pantoprazole 40 MG tablet Commonly known as: PROTONIX Take 1 tablet (40 mg total) by mouth daily.   pregabalin 150 MG capsule Commonly known as: Lyrica Take 1 capsule (150 mg total) by mouth 2 (two) times daily.   promethazine 25 MG tablet Commonly known as: PHENERGAN Take 1 tablet (25 mg total) by mouth every 6 (six) hours as needed for nausea or vomiting.   pyridostigmine 60 MG tablet Commonly known as: MESTINON Take 60 mg by mouth 3 (three) times daily.       Allergies:  Allergies  Allergen Reactions  . Anesthetics, Amide Nausea And Vomiting  . Penicillins Diarrhea, Nausea And Vomiting and Other (See Comments)    Has patient had a PCN reaction causing immediate rash, facial/tongue/throat swelling, SOB or lightheadedness with hypotension: Yes Has patient had a PCN reaction causing severe rash involving mucus membranes or skin necrosis: No Has patient had a PCN reaction that required hospitalization No Has patient had a PCN reaction occurring within the last 10 years: Yes  If all of the above answers are "NO", then may proceed with Cephalosporin use.   . Buprenorphine Hcl Rash  . Encainide Nausea And  Vomiting  . Metoclopramide Other (See Comments)    Dystonia, muscle rigidity.  Patient states she is only allergic to the "pill form, not the IV"    Past Medical History:  Diagnosis Date  . AKI (acute kidney injury) ([place])   . Anemia, iron deficiency   . Anxiety and depression 05/18/2015  . CAD (coronary artery disease), native coronary artery 01/11/2018   s/p CABG - Non-STEMI with severe three-vessel disease noted July 2019  . Depression   . Diabetic gastroparesis ([place])   . Diabetic neuropathy, type I diabetes mellitus ([place]) 05/18/2015  . DKA, type 1 ([place]) 03/18/2016  . Essential hypertension   . Gastroparesis   . GERD (gastroesophageal reflux disease)   . HLD (hyperlipidemia)   . MDD (major depressive disorder), single episode, severe , no psychosis ([place])   . Tardive dyskinesia   . Vitamin B12 deficiency 08/16/2015    Past Surgical History:  Procedure Laterality Date  . CARDIAC SURGERY    . COLONOSCOPY  09/27/2014   at [facility]  . CORONARY ARTERY BYPASS GRAFT N/A 01/14/2018   Procedure: CORONARY ARTERY BYPASS GRAFTING (CABG)X3, RIGHT AND LEFT SAPHENOUS VEIN HARVEST, MAMMARY TAKE DOWN. MAMMARY TO LAD, SVG TO PD, SVG TO DISTAL CIRC.;  Surgeon: Grace Isaac, MD;  Location: [facility];  Service: Open Heart Surgery;  Laterality: N/A;  . ESOPHAGOGASTRODUODENOSCOPY  09/27/2014   at [facility], Dr Rolan Lipa. biospy neg for celiac, neg for H pylori.   . EYE SURGERY    . gailstones    . IR FLUORO GUIDE CV LINE RIGHT  02/01/2017  . IR FLUORO GUIDE CV LINE RIGHT  03/06/2017  . IR FLUORO GUIDE CV LINE RIGHT  03/25/2017  . IR GENERIC HISTORICAL  01/24/2016   IR FLUORO GUIDE CV LINE RIGHT 01/24/2016 Darrell K Allred, PA-C [facility]  . IR GENERIC HISTORICAL  01/24/2016   IR US GUIDE VASC ACCESS RIGHT  01/24/2016 Darrell K Allred, PA-C [facility]  . IR US GUIDE VASC ACCESS RIGHT  02/01/2017  . IR US GUIDE VASC ACCESS RIGHT  03/06/2017  . IR US GUIDE VASC ACCESS RIGHT  03/25/2017  . IR US GUIDE VASC  ACCESS RIGHT  01/20/2019  . IR VENIPUNCTURE 75YRS/OLDER BY MD  01/20/2019  . LEFT HEART CATH AND CORONARY ANGIOGRAPHY N/A 01/07/2018   Procedure: LEFT HEART CATH AND CORONARY ANGIOGRAPHY;  Surgeon: Troy Sine, MD;  Location: [facility];  Service: Cardiovascular;  Laterality: N/A;  . POSTERIOR VITRECTOMY AND MEMBRANE PEEL-LEFT EYE  09/28/2002  . POSTERIOR VITRECTOMY AND MEMBRANE PEEL-RIGHT EYE  03/16/2002  . RETINAL DETACHMENT SURGERY    . TEE WITHOUT CARDIOVERSION N/A 01/14/2018   Procedure: TRANSESOPHAGEAL ECHOCARDIOGRAM (TEE);  Surgeon: Grace Isaac, MD;  Location: [facility];  Service: Open Heart Surgery;  Laterality: N/A;    Family History  Problem Relation Age of Onset  . Cystic fibrosis Mother   . Hypertension Father   . Diabetes Brother   . Hypertension Maternal Grandmother     Social History:  reports that she has never smoked. She has never used smokeless tobacco. She reports current drug use. Drug: Marijuana. She reports that she does not drink alcohol.      Review of Systems      Lipids: Taking atorvastatin for history of CAD prescribed by PCP  Lab Results  Component Value Date   CHOL 121 01/07/2018   HDL 47 01/07/2018   LDLCALC 54 01/07/2018   TRIG 101 01/07/2018   CHOLHDL 2.6 01/07/2018     DIABETES COMPLICATIONS: Gastroparesis treated with Phenergan as needed She was told to try [place] by a gastroenterologist in [place] but she does not think this was effective Still not able to follow-up because of the distance She said that local gastroenterologist has nothing to offer   On Lyrica for peripheral neuropathy    Physical Examination:  BP 110/60 (BP Location: Left Arm, Patient Position: Sitting, Cuff Size: Normal)   Pulse 73   Ht 5' 2"  (1.575 m)   Wt 165 lb 3.2 oz (74.9 kg)   SpO2 95%   BMI 30.22 kg/m       ASSESSMENT:  Diabetes type 1, long-standing with multiple complications  See history of present illness for detailed  discussion of current diabetes management, blood sugar patterns and problems identified  Recent problems identified  She has periodic episodes of ketoacidosis  She has poor knowledge about insulin adjustment and differences between mealtime and basal insulin  Appears to to be having significant variability of her blood sugar and low readings in the mornings reflecting less than 24 action of Lantus  However she gets low readings in the early afternoon from taking excessive amount of NovoLog for low carbohydrate meal  Blood sugars after dinner can be variably high based on her intake or compliance with insulin  Also sometimes not getting protein at dinner  She continues to not make appointments with the diabetes educator and dietitian as previously recommended    PLAN:   1. Glucose monitoring: . She was advised to check readings 4 times a day at least before her next visit . She needs to monitor before each meal consistently and also 2 hours after eating either breakfast or dinner . Again discussed use of the freestyle libre or Dexcom sensor if she is able to check 4 times a day    2.  Diabetes education: . Patient will need  carbohydrate counting advice and also meal planning advice including to help with a gastroparesis diet  3.  Lifestyle changes: . Encouraged regular walking for exercise   4.  Management changes prescribed:  She will take Lantus twice a day starting with 8 units twice daily  She will not exceed 10 units of insulin at breakfast  This will be adjusted further based on two-hour reading and reduce the dose further if she is getting hypoglycemia in the early afternoon  Make sure she has some kind of protein either meat or dairy products for her evening meal  She will keep a record of her blood sugars before and 2 hours after eating and write down what she is eating to take to the diabetes educator in 3 to 4 weeks  She will make a correction dose of 3 units  per 100 mg for high readings before meals  Needs eye exam  Consider trial of erythromycin or other empiric treatment for gastroparesis   Patient Instructions  For hi sugar take 3 units per 100 over the 100 mark  10 Novolog in am and 8-10 at supper     Lantus 8 units am and 8 at betime   Counseling time on subjects discussed in assessment and plan sections is over 50% of today's 25 minute visit  Elayne Snare 04/15/2019, 8:31 PM     Note: This note was prepared with Dragon voice recognition system technology. Any transcriptional errors that result from this process are unintentional.

## 2019-04-15 NOTE — Patient Instructions (Addendum)
For hi sugar take 3 units per 100 over the 100 mark  10 Novolog in am and 8-10 at supper     Lantus 8 units am and 8 at betime

## 2019-04-16 ENCOUNTER — Other Ambulatory Visit: Payer: Self-pay

## 2019-04-16 MED ORDER — INSULIN ASPART 100 UNIT/ML ~~LOC~~ SOLN: SUBCUTANEOUS | 0 refills | Status: DC

## 2019-04-16 MED ORDER — LANTUS SOLOSTAR 100 UNIT/ML ~~LOC~~ SOPN: PEN_INJECTOR | SUBCUTANEOUS | 3 refills | Status: DC

## 2019-04-16 MED ORDER — ACCU-CHEK AVIVA PLUS VI STRP: ORAL_STRIP | 2 refills | Status: DC

## 2019-04-22 ENCOUNTER — Encounter: Payer: Medicare HMO | Attending: Endocrinology | Admitting: Nutrition

## 2019-04-22 ENCOUNTER — Other Ambulatory Visit: Payer: Self-pay

## 2019-04-22 DIAGNOSIS — E1040 Type 1 diabetes mellitus with diabetic neuropathy, unspecified: Secondary | ICD-10-CM | POA: Diagnosis not present

## 2019-04-26 DIAGNOSIS — Z6830 Body mass index (BMI) 30.0-30.9, adult: Secondary | ICD-10-CM | POA: Insufficient documentation

## 2019-04-26 DIAGNOSIS — E669 Obesity, unspecified: Secondary | ICD-10-CM | POA: Insufficient documentation

## 2019-04-26 DIAGNOSIS — I129 Hypertensive chronic kidney disease with stage 1 through stage 4 chronic kidney disease, or unspecified chronic kidney disease: Secondary | ICD-10-CM | POA: Insufficient documentation

## 2019-04-27 NOTE — Patient Instructions (Signed)
Read over brochures and call if questions Consider taking Humalog in the afternoon when snacking so much.

## 2019-04-27 NOTE — Progress Notes (Signed)
Patient takes Lantus 8AM, 8PM and Humalog 8u acB and 5u acS.  She admits to snacking all afternoon with no insulin given and says her blood sugars before supper are "very high"--250-350mg .  She did not bring her meter.   Typical day: 9AM: up.  Takes Lantus 8u, hot tea. 10-11 Smoothie with 1/2 banana and juice and greens.  Takes 8u Humalog for this.  Sometimes eats 2 eggs, with cheese and sausage.  1-2 pieces of bread.  And hot tea. 1-5PM.  "snacks continuously all afternoon on crackers- Ritz with cheese or cookies, or ice cream.  Does not take Humalog for this, because only eats a small amount at a time.  Drinks water and has hot tea during this time.  No sugar added to tea 6PM: supper.  Meat-1-2 ounces, with 1 starchy veg., and usually non starchy veg.  Water to drink 9PM snack of "whatever is in the house".  No insulin for this as well.   Discussed the need for insulin when she eats, but does not like to take the shots.  Discussed use of a V-Go or OmniPOd, but told her that she must test her blood sugars at least 4 times/day.  She liked the V-Go and filled out paperwork to see if her insurance would cover this.  Told her that we must first get Dr. Dwyane Dee to ok ths.  She agreed.   She will not take any more injections (already doing 4per day.  Will wait to see what Dr. Dwyane Dee says and I will let her know.    Brochure given on V-go and OmniPod

## 2019-04-30 ENCOUNTER — Telehealth: Payer: Self-pay | Admitting: Family Medicine

## 2019-05-01 ENCOUNTER — Telehealth: Payer: Self-pay | Admitting: Dietician

## 2019-05-04 NOTE — Telephone Encounter (Signed)
Patient was called and told that she is not on enough insulin to use the V-go, however she can try the OmniPod.  She is willing to try this and gave me permission to sign her name on the form for an insurance investigation for this.

## 2019-05-06 ENCOUNTER — Telehealth: Payer: Self-pay

## 2019-05-06 ENCOUNTER — Telehealth: Payer: Self-pay | Admitting: Family Medicine

## 2019-05-06 NOTE — Telephone Encounter (Signed)
[facility] verbal order PT 1 time a week for 7 weeks OT 1 time a week for 6 weeks. Please call Lennette Bihari back at [phone]

## 2019-05-06 NOTE — Telephone Encounter (Signed)
VO Cont for:  OT 1x for 6 weeks   Address safety in the home

## 2019-05-07 NOTE — Telephone Encounter (Signed)
Lennette Bihari was called and given verbal orders for patient as listed.

## 2019-05-09 ENCOUNTER — Other Ambulatory Visit: Payer: Self-pay

## 2019-05-09 ENCOUNTER — Emergency Department: Payer: Medicare HMO

## 2019-05-09 ENCOUNTER — Inpatient Hospital Stay
Admission: EM | Admit: 2019-05-09 | Discharge: 2019-05-15 | DRG: 480 | Disposition: A | Discharge status: Skilled Nursing Facility | Payer: Medicare HMO | Attending: Internal Medicine | Admitting: Internal Medicine

## 2019-05-09 DIAGNOSIS — I2510 Atherosclerotic heart disease of native coronary artery without angina pectoris: Secondary | ICD-10-CM | POA: Diagnosis present

## 2019-05-09 DIAGNOSIS — I10 Essential (primary) hypertension: Secondary | ICD-10-CM | POA: Diagnosis not present

## 2019-05-09 DIAGNOSIS — I252 Old myocardial infarction: Secondary | ICD-10-CM

## 2019-05-09 DIAGNOSIS — Z8249 Family history of ischemic heart disease and other diseases of the circulatory system: Secondary | ICD-10-CM

## 2019-05-09 DIAGNOSIS — N1831 Chronic kidney disease, stage 3a: Secondary | ICD-10-CM | POA: Diagnosis present

## 2019-05-09 DIAGNOSIS — D509 Iron deficiency anemia, unspecified: Secondary | ICD-10-CM | POA: Diagnosis present

## 2019-05-09 DIAGNOSIS — Z951 Presence of aortocoronary bypass graft: Secondary | ICD-10-CM | POA: Diagnosis not present

## 2019-05-09 DIAGNOSIS — E1069 Type 1 diabetes mellitus with other specified complication: Secondary | ICD-10-CM | POA: Diagnosis present

## 2019-05-09 DIAGNOSIS — E1043 Type 1 diabetes mellitus with diabetic autonomic (poly)neuropathy: Secondary | ICD-10-CM | POA: Diagnosis present

## 2019-05-09 DIAGNOSIS — Z8349 Family history of other endocrine, nutritional and metabolic diseases: Secondary | ICD-10-CM

## 2019-05-09 DIAGNOSIS — S7291XA Unspecified fracture of right femur, initial encounter for closed fracture: Secondary | ICD-10-CM

## 2019-05-09 DIAGNOSIS — Z9119 Patient's noncompliance with other medical treatment and regimen: Secondary | ICD-10-CM

## 2019-05-09 DIAGNOSIS — J811 Chronic pulmonary edema: Secondary | ICD-10-CM | POA: Diagnosis present

## 2019-05-09 DIAGNOSIS — E785 Hyperlipidemia, unspecified: Secondary | ICD-10-CM | POA: Diagnosis present

## 2019-05-09 DIAGNOSIS — I130 Hypertensive heart and chronic kidney disease with heart failure and stage 1 through stage 4 chronic kidney disease, or unspecified chronic kidney disease: Secondary | ICD-10-CM | POA: Diagnosis present

## 2019-05-09 DIAGNOSIS — K3184 Gastroparesis: Secondary | ICD-10-CM | POA: Diagnosis present

## 2019-05-09 DIAGNOSIS — F32A Depression, unspecified: Secondary | ICD-10-CM | POA: Diagnosis present

## 2019-05-09 DIAGNOSIS — Z794 Long term (current) use of insulin: Secondary | ICD-10-CM | POA: Diagnosis not present

## 2019-05-09 DIAGNOSIS — S72301A Unspecified fracture of shaft of right femur, initial encounter for closed fracture: Principal | ICD-10-CM | POA: Diagnosis present

## 2019-05-09 DIAGNOSIS — Z20828 Contact with and (suspected) exposure to other viral communicable diseases: Secondary | ICD-10-CM | POA: Diagnosis present

## 2019-05-09 DIAGNOSIS — Z7982 Long term (current) use of aspirin: Secondary | ICD-10-CM

## 2019-05-09 DIAGNOSIS — E1022 Type 1 diabetes mellitus with diabetic chronic kidney disease: Secondary | ICD-10-CM | POA: Diagnosis present

## 2019-05-09 DIAGNOSIS — I5033 Acute on chronic diastolic (congestive) heart failure: Secondary | ICD-10-CM | POA: Diagnosis present

## 2019-05-09 DIAGNOSIS — W1830XA Fall on same level, unspecified, initial encounter: Secondary | ICD-10-CM | POA: Diagnosis present

## 2019-05-09 DIAGNOSIS — F329 Major depressive disorder, single episode, unspecified: Secondary | ICD-10-CM | POA: Diagnosis not present

## 2019-05-09 DIAGNOSIS — W19XXXA Unspecified fall, initial encounter: Secondary | ICD-10-CM

## 2019-05-09 DIAGNOSIS — Z01810 Encounter for preprocedural cardiovascular examination: Secondary | ICD-10-CM | POA: Diagnosis not present

## 2019-05-09 DIAGNOSIS — F418 Other specified anxiety disorders: Secondary | ICD-10-CM | POA: Diagnosis present

## 2019-05-09 DIAGNOSIS — K219 Gastro-esophageal reflux disease without esophagitis: Secondary | ICD-10-CM | POA: Diagnosis present

## 2019-05-09 DIAGNOSIS — E1065 Type 1 diabetes mellitus with hyperglycemia: Secondary | ICD-10-CM | POA: Diagnosis present

## 2019-05-09 DIAGNOSIS — E669 Obesity, unspecified: Secondary | ICD-10-CM | POA: Diagnosis present

## 2019-05-09 DIAGNOSIS — S72001A Fracture of unspecified part of neck of right femur, initial encounter for closed fracture: Secondary | ICD-10-CM | POA: Diagnosis not present

## 2019-05-09 DIAGNOSIS — Z79899 Other long term (current) drug therapy: Secondary | ICD-10-CM

## 2019-05-09 DIAGNOSIS — I509 Heart failure, unspecified: Secondary | ICD-10-CM | POA: Diagnosis not present

## 2019-05-09 DIAGNOSIS — F419 Anxiety disorder, unspecified: Secondary | ICD-10-CM | POA: Diagnosis not present

## 2019-05-09 LAB — BASIC METABOLIC PANEL
Anion gap: 9 (ref 5–15)
BUN: 18 mg/dL (ref 6–20)
CO2: 25 mmol/L (ref 22–32)
Calcium: 8.8 mg/dL — ABNORMAL LOW (ref 8.9–10.3)
Chloride: 103 mmol/L (ref 98–111)
Creatinine, Ser: 1.18 mg/dL — ABNORMAL HIGH (ref 0.44–1.00)
GFR calc Af Amer: >60 mL/min (ref >60)
GFR calc non Af Amer: 53 mL/min — ABNORMAL LOW (ref >60)
Glucose, Bld: 122 mg/dL — ABNORMAL HIGH (ref 70–99)
Potassium: 4.1 mmol/L (ref 3.5–5.1)
Sodium: 137 mmol/L (ref 135–145)

## 2019-05-09 LAB — CBC WITH DIFFERENTIAL/PLATELET
Abs Immature Granulocytes: 0.12 10*3/uL — ABNORMAL HIGH (ref 0.00–0.07)
Basophils Absolute: 0.1 10*3/uL (ref 0.0–0.1)
Basophils Relative: 0 %
Eosinophils Absolute: 0.1 10*3/uL (ref 0.0–0.5)
Eosinophils Relative: 0 %
HCT: 29.8 % — ABNORMAL LOW (ref 36.0–46.0)
Hemoglobin: 8.6 g/dL — ABNORMAL LOW (ref 12.0–15.0)
Immature Granulocytes: 1 %
Lymphocytes Relative: 7 %
Lymphs Abs: 1 10*3/uL (ref 0.7–4.0)
MCH: 22.5 pg — ABNORMAL LOW (ref 26.0–34.0)
MCHC: 28.9 g/dL — ABNORMAL LOW (ref 30.0–36.0)
MCV: 77.8 fL — ABNORMAL LOW (ref 80.0–100.0)
Monocytes Absolute: 1.1 10*3/uL — ABNORMAL HIGH (ref 0.1–1.0)
Monocytes Relative: 7 %
Neutro Abs: 13.6 10*3/uL — ABNORMAL HIGH (ref 1.7–7.7)
Neutrophils Relative %: 85 %
Platelets: 402 10*3/uL — ABNORMAL HIGH (ref 150–400)
RBC: 3.83 MIL/uL — ABNORMAL LOW (ref 3.87–5.11)
RDW: 23.3 % — ABNORMAL HIGH (ref 11.5–15.5)
WBC: 15.9 10*3/uL — ABNORMAL HIGH (ref 4.0–10.5)
nRBC: 0.1 % (ref 0.0–0.2)

## 2019-05-09 MED ORDER — MORPHINE SULFATE (PF) 2 MG/ML IV SOLN
1.0000 mg | INTRAVENOUS | Status: DC | PRN
  Administered 2019-05-09 – 2019-05-11 (×8): 1 mg via INTRAVENOUS
  Filled 2019-05-09 (×8): qty 1

## 2019-05-09 MED ORDER — SENNOSIDES-DOCUSATE SODIUM 8.6-50 MG PO TABS
1.0000 | ORAL_TABLET | Freq: Every evening | ORAL | Status: DC | PRN
  Administered 2019-05-13: 1 via ORAL
  Filled 2019-05-09: qty 1

## 2019-05-09 MED ORDER — INSULIN ASPART 100 UNIT/ML ~~LOC~~ SOLN
0.0000 [IU] | Freq: Every day | SUBCUTANEOUS | Status: DC
  Administered 2019-05-10: 3 [IU] via SUBCUTANEOUS
  Filled 2019-05-09 (×2): qty 1

## 2019-05-09 MED ORDER — HYDROCODONE-ACETAMINOPHEN 5-325 MG PO TABS
1.0000 | ORAL_TABLET | Freq: Four times a day (QID) | ORAL | Status: DC | PRN
  Administered 2019-05-10 (×2): 1 via ORAL
  Administered 2019-05-11: 2 via ORAL
  Administered 2019-05-11 (×2): 1 via ORAL
  Administered 2019-05-12 – 2019-05-15 (×10): 2 via ORAL
  Filled 2019-05-09 (×2): qty 2
  Filled 2019-05-09 (×3): qty 1
  Filled 2019-05-09 (×9): qty 2
  Filled 2019-05-09: qty 1

## 2019-05-09 MED ORDER — INSULIN GLARGINE 100 UNIT/ML ~~LOC~~ SOLN
8.0000 [IU] | Freq: Every day | SUBCUTANEOUS | Status: DC
  Administered 2019-05-10: 8 [IU] via SUBCUTANEOUS
  Filled 2019-05-09 (×3): qty 0.08

## 2019-05-09 MED ORDER — ONDANSETRON HCL 4 MG/2ML IJ SOLN
4.0000 mg | Freq: Four times a day (QID) | INTRAMUSCULAR | Status: DC | PRN
  Administered 2019-05-10 (×2): 4 mg via INTRAVENOUS
  Filled 2019-05-09: qty 2

## 2019-05-09 MED ORDER — LORAZEPAM 2 MG/ML IJ SOLN: 1.0000 mg | Freq: Once | INTRAMUSCULAR | Status: DC | PRN

## 2019-05-09 MED ORDER — BUSPIRONE HCL 10 MG PO TABS
10.0000 mg | ORAL_TABLET | Freq: Three times a day (TID) | ORAL | Status: DC
  Administered 2019-05-10 – 2019-05-15 (×14): 10 mg via ORAL
  Filled 2019-05-09 (×14): qty 1

## 2019-05-09 MED ORDER — PREGABALIN 75 MG PO CAPS
150.0000 mg | ORAL_CAPSULE | Freq: Two times a day (BID) | ORAL | Status: DC
  Administered 2019-05-10 – 2019-05-15 (×11): 150 mg via ORAL
  Filled 2019-05-09 (×11): qty 2

## 2019-05-09 MED ORDER — MORPHINE SULFATE (PF) 4 MG/ML IV SOLN
INTRAVENOUS | Status: AC
  Administered 2019-05-09: 4 mg via INTRAVENOUS
  Filled 2019-05-09: qty 1

## 2019-05-09 MED ORDER — INSULIN ASPART 100 UNIT/ML ~~LOC~~ SOLN
0.0000 [IU] | Freq: Three times a day (TID) | SUBCUTANEOUS | Status: DC
  Administered 2019-05-10: 2 [IU] via SUBCUTANEOUS
  Administered 2019-05-10: 8 [IU] via SUBCUTANEOUS
  Administered 2019-05-11: 11 [IU] via SUBCUTANEOUS
  Administered 2019-05-11: 3 [IU] via SUBCUTANEOUS
  Filled 2019-05-09 (×4): qty 1

## 2019-05-09 MED ORDER — MORPHINE SULFATE (PF) 4 MG/ML IV SOLN
4.0000 mg | Freq: Once | INTRAVENOUS | Status: AC
  Administered 2019-05-09: 21:00:00 4 mg via INTRAVENOUS

## 2019-05-09 MED ORDER — METOPROLOL TARTRATE 25 MG PO TABS
12.5000 mg | ORAL_TABLET | Freq: Two times a day (BID) | ORAL | Status: DC
  Administered 2019-05-10 – 2019-05-15 (×12): 12.5 mg via ORAL
  Filled 2019-05-09 (×12): qty 1

## 2019-05-09 MED ORDER — MORPHINE SULFATE (PF) 4 MG/ML IV SOLN
4.0000 mg | Freq: Once | INTRAVENOUS | Status: AC
  Administered 2019-05-09: 22:00:00 4 mg via INTRAVENOUS

## 2019-05-09 MED ORDER — LOSARTAN POTASSIUM 25 MG PO TABS: 25.0000 mg | ORAL_TABLET | Freq: Every day | ORAL | Status: DC

## 2019-05-09 MED ORDER — FUROSEMIDE 10 MG/ML IJ SOLN
20.0000 mg | Freq: Once | INTRAMUSCULAR | Status: AC
  Administered 2019-05-10: 20 mg via INTRAVENOUS
  Filled 2019-05-09: qty 4

## 2019-05-09 NOTE — ED Notes (Signed)
MD at bedside. 

## 2019-05-09 NOTE — ED Notes (Addendum)
ED TO INPATIENT HANDOFF REPORT  ED Nurse Name and Phone #:  Tom RN  S Name/Age/Gender [name] 53 y.o. female Room/Bed: ED05HA/ED05HA  Code Status   Code Status: Prior  Home/SNF/Other Home Patient oriented to: self, place, time and situation Is this baseline? Yes   Triage Complete: Triage complete  Chief Complaint fall, side pain  Triage Note Pt arrives to ED from [facility] via [facility] with c/c of witnessed fall and acute right hip/leg pain. Pt was sitting on bench at [employer] and slid over to let someone sit down. She states she fell off the edge of the bench and landed on her right hip. Denies hitting head, LoC. Pt in obvious distress, tachypnea. EMS reports transport vitals of 151/68, p80, SPo2 95%, CBG 76.    Allergies Allergies  Allergen Reactions  . Anesthetics, Amide Nausea And Vomiting  . Penicillins Diarrhea, Nausea And Vomiting and Other (See Comments)    Has patient had a PCN reaction causing immediate rash, facial/tongue/throat swelling, SOB or lightheadedness with hypotension: Yes Has patient had a PCN reaction causing severe rash involving mucus membranes or skin necrosis: No Has patient had a PCN reaction that required hospitalization No Has patient had a PCN reaction occurring within the last 10 years: Yes  If all of the above answers are "NO", then may proceed with Cephalosporin use.   . Buprenorphine Hcl Rash  . Encainide Nausea And Vomiting  . Metoclopramide Other (See Comments)    Dystonia, muscle rigidity.  Patient states she is only allergic to the "pill form, not the IV"    Level of Care/Admitting Diagnosis ED Disposition    ED Disposition Condition [place]: [place] [[id number]]  Level of Care: Med-Surg [16]  Covid Evaluation: Asymptomatic Screening Protocol (No Symptoms)  Diagnosis: Closed fracture of proximal end of femur, right, [id number]  Admitting  Physician: Lenore Cordia [id number]  Attending Physician: Lenore Cordia [id number]  Estimated length of stay: past midnight tomorrow  Certification:: I certify this patient will need inpatient services for at least 2 midnights  PT Class (Do Not Modify): Inpatient [101]  PT Acc Code (Do Not Modify): Private [1]       B Medical/Surgery History Past Medical History:  Diagnosis Date  . AKI (acute kidney injury) ([place])   . Anemia, iron deficiency   . Anxiety and depression 05/18/2015  . CAD (coronary artery disease), native coronary artery 01/11/2018   s/p CABG - Non-STEMI with severe three-vessel disease noted July 2019  . Depression   . Diabetic gastroparesis ([place])   . Diabetic neuropathy, type I diabetes mellitus ([place]) 05/18/2015  . DKA, type 1 ([place]) 03/18/2016  . Essential hypertension   . Gastroparesis   . GERD (gastroesophageal reflux disease)   . HLD (hyperlipidemia)   . MDD (major depressive disorder), single episode, severe , no psychosis ([place])   . Tardive dyskinesia   . Vitamin B12 deficiency 08/16/2015   Past Surgical History:  Procedure Laterality Date  . CARDIAC SURGERY    . COLONOSCOPY  09/27/2014   at [facility]  . CORONARY ARTERY BYPASS GRAFT N/A 01/14/2018   Procedure: CORONARY ARTERY BYPASS GRAFTING (CABG)X3, RIGHT AND LEFT SAPHENOUS VEIN HARVEST, MAMMARY TAKE DOWN. MAMMARY TO LAD, SVG TO PD, SVG TO DISTAL CIRC.;  Surgeon: Grace Isaac, MD;  Location: [facility];  Service: Open Heart Surgery;  Laterality: N/A;  . ESOPHAGOGASTRODUODENOSCOPY  09/27/2014   at [facility],  Dr Rolan Lipa. biospy neg for celiac, neg for H pylori.   . EYE SURGERY    . gailstones    . IR FLUORO GUIDE CV LINE RIGHT  02/01/2017  . IR FLUORO GUIDE CV LINE RIGHT  03/06/2017  . IR FLUORO GUIDE CV LINE RIGHT  03/25/2017  . IR GENERIC HISTORICAL  01/24/2016   IR FLUORO GUIDE CV LINE RIGHT 01/24/2016 Darrell K Allred, PA-C [facility]-INTERV RAD  . IR GENERIC HISTORICAL  01/24/2016   IR US GUIDE VASC ACCESS RIGHT  01/24/2016 Darrell K Allred, PA-C [facility]  . IR US GUIDE VASC ACCESS RIGHT  02/01/2017  . IR US GUIDE VASC ACCESS RIGHT  03/06/2017  . IR US GUIDE VASC ACCESS RIGHT  03/25/2017  . IR US GUIDE VASC ACCESS RIGHT  01/20/2019  . IR VENIPUNCTURE 61YRS/OLDER BY MD  01/20/2019  . LEFT HEART CATH AND CORONARY ANGIOGRAPHY N/A 01/07/2018   Procedure: LEFT HEART CATH AND CORONARY ANGIOGRAPHY;  Surgeon: Troy Sine, MD;  Location: [place] [facility];  Service: Cardiovascular;  Laterality: N/A;  . POSTERIOR VITRECTOMY AND MEMBRANE PEEL-LEFT EYE  09/28/2002  . POSTERIOR VITRECTOMY AND MEMBRANE PEEL-RIGHT EYE  03/16/2002  . RETINAL DETACHMENT SURGERY    . TEE WITHOUT CARDIOVERSION N/A 01/14/2018   Procedure: TRANSESOPHAGEAL ECHOCARDIOGRAM (TEE);  Surgeon: Grace Isaac, MD;  Location: [facility];  Service: Open Heart Surgery;  Laterality: N/A;     A IV Location/Drains/Wounds Patient Lines/Drains/Airways Status   Active Line/Drains/Airways    Name:   Placement date:   Placement time:   Site:   Days:   Peripheral IV   -    -    -      Peripheral IV 05/09/19 Right Antecubital   05/09/19    2047    Antecubital   less than 1          Intake/Output Last 24 hours No intake or output data in the 24 hours ending 05/09/19 2231  Labs/Imaging No results found for this or any previous visit (from the past 49 hour(s)). Dg Chest 1 View  Result Date: 05/09/2019 CLINICAL DATA:  Recent fall with chest pain, initial encounter EXAM: CHEST  1 VIEW COMPARISON:  04/27/2019 FINDINGS: Cardiac shadow is stable. Postoperative changes are seen. Diffuse vascular congestion and interstitial edema is noted consistent with early CHF. No pleural effusion is noted. No focal infiltrate is seen. No bony abnormality is noted. IMPRESSION: Changes consistent with early CHF. Electronically Signed   By: Inez Catalina M.D.   On: 05/09/2019 21:38   Dg Pelvis 1-2 Views  Result Date: 05/09/2019 CLINICAL DATA:  Fall.  Right leg pain EXAM:  PELVIS - 1-2 VIEW COMPARISON:  None. FINDINGS: Fracture through the proximal right femoral shaft just below the intertrochanteric region. Mild varus angulation. No subluxation or dislocation. Hip joints and SI joints are symmetric. IMPRESSION: Fracture through the proximal right femoral shaft with varus angulation. Electronically Signed   By: Rolm Baptise M.D.   On: 05/09/2019 21:35   Dg Femur, Min 2 Views Right  Result Date: 05/09/2019 CLINICAL DATA:  Fall EXAM: RIGHT FEMUR 2 VIEWS COMPARISON:  Pelvis study today FINDINGS: Fracture through the proximal right femoral shaft just below the intertrochanteric region. Mild varus angulation. No subluxation or dislocation. Right hip joint and knee joint unremarkable. IMPRESSION: Fracture through the proximal right femoral shaft with varus angulation. Electronically Signed   By: Rolm Baptise M.D.   On: 05/09/2019 21:36    Pending Labs  Unresulted Labs (From admission, onward)    Start     Ordered   05/09/19 2149  SARS CORONAVIRUS 2 (TAT 6-24 HRS) Nasopharyngeal Nasopharyngeal Swab  (Asymptomatic/Tier 2 Patients Labs)  Once,   STAT    Question Answer Comment  Is this test for diagnosis or screening Screening   Symptomatic for COVID-19 as defined by CDC No   Hospitalized for COVID-19 No   Admitted to ICU for COVID-19 No   Previously tested for COVID-19 Yes   Resident in a congregate (group) care setting No   Employed in healthcare setting No   Pregnant No      05/09/19 2148   05/09/19 [id number]  Basic metabolic panel  Once,   STAT     05/09/19 2145   05/09/19 2146  CBC with Differential  Once,   STAT     05/09/19 2145          Vitals/Pain Today's Vitals   05/09/19 2016 05/09/19 2017 05/09/19 2128  BP: (!) 136/59    Pulse: 78    Resp: (!) 28    Temp: 98.7 F (37.1 C)    TempSrc: Oral    SpO2: 92%    Weight:  73.5 kg   Height:  5\' 1"  (1.549 m)   PainSc:  10-Worst pain ever 10-Worst pain ever    Isolation Precautions No active  isolations  Medications Medications  morphine 4 MG/ML injection 4 mg (4 mg Intravenous Given 05/09/19 2048)  morphine 4 MG/ML injection 4 mg (4 mg Intravenous Given 05/09/19 2139)    Mobility non-ambulatory Low fall risk   Focused Assessments Cardiac Assessment Handoff:    Lab Results  Component Value Date   CKTOTAL 82 03/24/2017   CKMB 2.5 03/31/2010   TROPONINI <0.03 12/15/2018   Lab Results  Component Value Date   DDIMER 0.64 (H) 04/03/2012   Does the Patient currently have chest pain? No     R Recommendations: See Admitting Provider Note  Report given to: Matt RN on 1A  Additional Notes:

## 2019-05-09 NOTE — H&P (Addendum)
History and Physical    [name] XVQ:[id number] DOB: 1965-07-10 DOA: 05/09/2019  PCP: [name], MD  Patient coming from: [facility] via EMS  I have personally briefly reviewed patient's old medical records in [place]  Chief Complaint: Right hip pain after a fall  HPI: [name] is a 53 y.o. female with medical history significant for CAD s/p CABG, type 1 diabetes with neuropathy and gastroparesis, hypertension, and depression with anxiety who presents to the ED for evaluation of right hip pain after a fall.  [place].  She was scooting over to let her friend sit down but did not realize she was on the edge of the bench and fell off hitting the ground with her right hip.  She had immediate pain and was unable to bear weight on her own.  She denies hitting her head or losing consciousness.  She denies any recent chest pain, palpitations, abdominal pain, dysuria.  She reports significant pain at time of admission which is causing her to breathe faster.  She is anxious.  She has a history of CAD and underwent CABG x3 on 01/14/2018.  Transesophageal echocardiogram at that time showed an EF of 50-55% with normal LV diastolic function, no valvular abnormality noted.  She follows with cardiology, Dr. Acie Fredrickson and was last seen on 03/06/2019.  She takes aspirin 81 mg daily and is not on any anticoagulation.  She uses Lasix orally at home as needed for swelling.  ED Course:  Initial vitals showed BP 136/59, pulse 78, RR 28, temp 98.7 Fahrenheit, SPO2 92% on room air.  X-rays of the pelvis and right femur showed a fracture through the proximal right femoral shaft with varus angulation.  1 view chest x-ray showed prior CABG changes with venous congestion.  No focal consolidation or pleural effusion noted.  EDP discussed the case with on-call orthopedics, Dr. Harlow Mares, who will see patient and plan for tentative  right hip fracture 05/10/2019.  The hospitalist service was consulted to admit for further evaluation and management.  Review of Systems: All systems reviewed and are negative except as documented in history of present illness above.   Past Medical History:  Diagnosis Date  . AKI (acute kidney injury) ([place])   . Anemia, iron deficiency   . Anxiety and depression 05/18/2015  . CAD (coronary artery disease), native coronary artery 01/11/2018   s/p CABG - Non-STEMI with severe three-vessel disease noted July 2019  . Depression   . Diabetic gastroparesis ([place])   . Diabetic neuropathy, type I diabetes mellitus ([place]) 05/18/2015  . DKA, type 1 ([place]) 03/18/2016  . Essential hypertension   . Gastroparesis   . GERD (gastroesophageal reflux disease)   . HLD (hyperlipidemia)   . MDD (major depressive disorder), single episode, severe , no psychosis ([place])   . Tardive dyskinesia   . Vitamin B12 deficiency 08/16/2015    Past Surgical History:  Procedure Laterality Date  . CARDIAC SURGERY    . COLONOSCOPY  09/27/2014   at [facility]  . CORONARY ARTERY BYPASS GRAFT N/A 01/14/2018   Procedure: CORONARY ARTERY BYPASS GRAFTING (CABG)X3, RIGHT AND LEFT SAPHENOUS VEIN HARVEST, MAMMARY TAKE DOWN. MAMMARY TO LAD, SVG TO PD, SVG TO DISTAL CIRC.;  Surgeon: Grace Isaac, MD;  Location: [facility];  Service: Open Heart Surgery;  Laterality: N/A;  . ESOPHAGOGASTRODUODENOSCOPY  09/27/2014   at [facility], Dr Rolan Lipa. biospy neg for celiac, neg for  H pylori.   . EYE SURGERY    . gailstones    . IR FLUORO GUIDE CV LINE RIGHT  02/01/2017  . IR FLUORO GUIDE CV LINE RIGHT  03/06/2017  . IR FLUORO GUIDE CV LINE RIGHT  03/25/2017  . IR GENERIC HISTORICAL  01/24/2016   IR FLUORO GUIDE CV LINE RIGHT 01/24/2016 Darrell K Allred, PA-C [facility]  . IR GENERIC HISTORICAL  01/24/2016   IR US GUIDE VASC ACCESS RIGHT 01/24/2016 Darrell K Allred, PA-C [facility]  . IR US GUIDE VASC ACCESS RIGHT  02/01/2017  . IR US GUIDE VASC  ACCESS RIGHT  03/06/2017  . IR US GUIDE VASC ACCESS RIGHT  03/25/2017  . IR US GUIDE VASC ACCESS RIGHT  01/20/2019  . IR VENIPUNCTURE 28YRS/OLDER BY MD  01/20/2019  . LEFT HEART CATH AND CORONARY ANGIOGRAPHY N/A 01/07/2018   Procedure: LEFT HEART CATH AND CORONARY ANGIOGRAPHY;  Surgeon: Troy Sine, MD;  Location: [facility];  Service: Cardiovascular;  Laterality: N/A;  . POSTERIOR VITRECTOMY AND MEMBRANE PEEL-LEFT EYE  09/28/2002  . POSTERIOR VITRECTOMY AND MEMBRANE PEEL-RIGHT EYE  03/16/2002  . RETINAL DETACHMENT SURGERY    . TEE WITHOUT CARDIOVERSION N/A 01/14/2018   Procedure: TRANSESOPHAGEAL ECHOCARDIOGRAM (TEE);  Surgeon: Grace Isaac, MD;  Location: [facility];  Service: Open Heart Surgery;  Laterality: N/A;    Social History:  reports that she has never smoked. She has never used smokeless tobacco. She reports current drug use. Drug: Marijuana. She reports that she does not drink alcohol.  Allergies  Allergen Reactions  . Anesthetics, Amide Nausea And Vomiting  . Penicillins Diarrhea, Nausea And Vomiting and Other (See Comments)    Has patient had a PCN reaction causing immediate rash, facial/tongue/throat swelling, SOB or lightheadedness with hypotension: Yes Has patient had a PCN reaction causing severe rash involving mucus membranes or skin necrosis: No Has patient had a PCN reaction that required hospitalization No Has patient had a PCN reaction occurring within the last 10 years: Yes  If all of the above answers are "NO", then may proceed with Cephalosporin use.   . Buprenorphine Hcl Rash  . Encainide Nausea And Vomiting  . Metoclopramide Other (See Comments)    Dystonia, muscle rigidity.  Patient states she is only allergic to the "pill form, not the IV"    Family History  Problem Relation Age of Onset  . Cystic fibrosis Mother   . Hypertension Father   . Diabetes Brother   . Hypertension Maternal Grandmother      Prior to Admission medications   Medication  Sig Start Date End Date Taking? Authorizing Provider  ammonium lactate (AMLACTIN) 12 % cream Apply topically as needed for dry skin. 07/08/18   Trula Slade, DPM  aspirin EC 81 MG tablet Take 1 tablet (81 mg total) by mouth daily. 06/03/18   Nahser, Wonda Cheng, MD  Blood Glucose Monitoring Suppl (ONETOUCH VERIO) w/Device KIT Use as directed 03/21/18   [name], MD  busPIRone (BUSPAR) 10 MG tablet Take 1 tablet (10 mg total) by mouth 3 (three) times daily. 06/12/18   [name], MD  docusate sodium (COLACE) 100 MG capsule Take 1 capsule (100 mg total) by mouth daily as needed for mild constipation. 02/03/18   Burtis Junes, NP  glucose blood (ACCU-CHEK AVIVA PLUS) test strip Use as instructed to test blood sugar 4 times daily Dx E10.65 04/16/19   Elayne Snare, MD  insulin aspart (NOVOLOG) 100 UNIT/ML injection  Inject 10 units under the skin in the morning, and 8-10 units at supper. 04/16/19   Elayne Snare, MD  Insulin Glargine (LANTUS SOLOSTAR) 100 UNIT/ML Solostar Pen Inject 8 units under the skin in the morning and at night. 04/16/19   Elayne Snare, MD  Insulin Syringe-Needle U-100 (BD INSULIN SYRINGE ULTRAFINE) 31G X 5/16" 0.5 ML MISC 1 each by Does not apply route 4 (four) times daily. 04/28/18   Elayne Snare, MD  Lancets [facility] DELICA PLUS [id number]) [place] 1 Units by Does not apply route as directed. 03/21/18   [name], MD  loperamide (IMODIUM) 2 MG capsule Take 1 capsule (2 mg total) by mouth 3 (three) times daily as needed for diarrhea or loose stools. 12/25/18   Aline August, MD  losartan (COZAAR) 25 MG tablet Take 1 tablet (25 mg total) by mouth daily. 03/06/19   Nahser, Wonda Cheng, MD  metoprolol tartrate (LOPRESSOR) 25 MG tablet Take 0.5 tablets (12.5 mg total) by mouth 2 (two) times daily. 10/08/18   Nahser, Wonda Cheng, MD  ondansetron (ZOFRAN ODT) 4 MG disintegrating tablet Take 1 tablet (4 mg total) by mouth every 6 (six) hours as needed for nausea or vomiting. 03/02/19   Ward,  Delice Bison, DO  pantoprazole (PROTONIX) 40 MG tablet Take 1 tablet (40 mg total) by mouth daily. 06/12/18   [name], MD  pregabalin (LYRICA) 150 MG capsule Take 1 capsule (150 mg total) by mouth 2 (two) times daily. 01/02/19   Elayne Snare, MD  promethazine (PHENERGAN) 25 MG tablet Take 1 tablet (25 mg total) by mouth every 6 (six) hours as needed for nausea or vomiting. 03/02/19   Ward, Delice Bison, DO  pyridostigmine (MESTINON) 60 MG tablet Take 60 mg by mouth 3 (three) times daily.    [provider]    Physical Exam: Vitals:   05/09/19 2016 05/09/19 2017 05/09/19 2301  BP: (!) 136/59  (!) 124/56  Pulse: 78  77  Resp: (!) 28  (!) 26  Temp: 98.7 F (37.1 C)    TempSrc: Oral    SpO2: 92%  99%  Weight:  73.5 kg   Height:  5' 1"  (1.549 m)     Constitutional: Obese woman resting supine in bed, she is anxious Eyes: PERRL, lids and conjunctivae normal ENMT: Mucous membranes are moist. Posterior pharynx clear of any exudate or lesions.Normal dentition.  Neck: normal, supple, no masses. Respiratory: clear to auscultation anteriorly.  Respiratory rate increased in setting of pain. No accessory muscle use.  Cardiovascular: Regular rate and rhythm, no murmurs / rubs / gallops.  Nonpitting edema of both legs. 2+ pedal pulses. Abdomen: no tenderness, no masses palpated. No hepatosplenomegaly. Bowel sounds positive.  Musculoskeletal: no clubbing / cyanosis. No joint deformity upper and lower extremities.  Right foot everted, ROM of right hip limited due to pain/fracture. Skin: no rashes, lesions, ulcers. No induration Neurologic: CN 2-12 grossly intact. Sensation intact, Strength diminished at right hip due to fracture/pain.  Strength is otherwise intact in all other extremities. Psychiatric: Normal judgment and insight. Alert and oriented x 3.  Anxious mood.   Labs on Admission: I have personally reviewed following labs and imaging studies  CBC: Recent Labs  Lab 05/09/19 2317   WBC 15.9*  NEUTROABS 13.6*  HGB 8.6*  HCT 29.8*  MCV 77.8*  PLT 446*   Basic Metabolic Panel: No results for input(s): NA, K, CL, CO2, GLUCOSE, BUN, CREATININE, CALCIUM, MG, PHOS in the last 168 hours. GFR: CrCl cannot  be calculated (Patient's most recent lab result is older than the maximum 21 days allowed.). Liver Function Tests: No results for input(s): AST, ALT, ALKPHOS, BILITOT, PROT, ALBUMIN in the last 168 hours. No results for input(s): LIPASE, AMYLASE in the last 168 hours. No results for input(s): AMMONIA in the last 168 hours. Coagulation Profile: No results for input(s): INR, PROTIME in the last 168 hours. Cardiac Enzymes: No results for input(s): CKTOTAL, CKMB, CKMBINDEX, TROPONINI in the last 168 hours. BNP (last 3 results) No results for input(s): PROBNP in the last 8760 hours. HbA1C: No results for input(s): HGBA1C in the last 72 hours. CBG: No results for input(s): GLUCAP in the last 168 hours. Lipid Profile: No results for input(s): CHOL, HDL, LDLCALC, TRIG, CHOLHDL, LDLDIRECT in the last 72 hours. Thyroid Function Tests: No results for input(s): TSH, T4TOTAL, FREET4, T3FREE, THYROIDAB in the last 72 hours. Anemia Panel: No results for input(s): VITAMINB12, FOLATE, FERRITIN, TIBC, IRON, RETICCTPCT in the last 72 hours. Urine analysis:    Component Value Date/Time   COLORURINE YELLOW 03/03/2019 1311   APPEARANCEUR CLEAR 03/03/2019 1311   LABSPEC 1.016 03/03/2019 1311   PHURINE 6.0 03/03/2019 1311   GLUCOSEU >=500 (A) 03/03/2019 1311   HGBUR SMALL (A) 03/03/2019 1311   BILIRUBINUR NEGATIVE 03/03/2019 1311   BILIRUBINUR negative 11/13/2018 1102   KETONESUR 80 (A) 03/03/2019 1311   PROTEINUR 100 (A) 03/03/2019 1311   UROBILINOGEN 0.2 11/13/2018 1102   UROBILINOGEN 0.2 04/27/2015 1608   NITRITE NEGATIVE 03/03/2019 1311   LEUKOCYTESUR NEGATIVE 03/03/2019 1311    Radiological Exams on Admission: Dg Chest 1 View  Result Date: 05/09/2019 CLINICAL DATA:   Recent fall with chest pain, initial encounter EXAM: CHEST  1 VIEW COMPARISON:  04/27/2019 FINDINGS: Cardiac shadow is stable. Postoperative changes are seen. Diffuse vascular congestion and interstitial edema is noted consistent with early CHF. No pleural effusion is noted. No focal infiltrate is seen. No bony abnormality is noted. IMPRESSION: Changes consistent with early CHF. Electronically Signed   By: Inez Catalina M.D.   On: 05/09/2019 21:38   Dg Pelvis 1-2 Views  Result Date: 05/09/2019 CLINICAL DATA:  Fall.  Right leg pain EXAM: PELVIS - 1-2 VIEW COMPARISON:  None. FINDINGS: Fracture through the proximal right femoral shaft just below the intertrochanteric region. Mild varus angulation. No subluxation or dislocation. Hip joints and SI joints are symmetric. IMPRESSION: Fracture through the proximal right femoral shaft with varus angulation. Electronically Signed   By: Rolm Baptise M.D.   On: 05/09/2019 21:35   Dg Femur, Min 2 Views Right  Result Date: 05/09/2019 CLINICAL DATA:  Fall EXAM: RIGHT FEMUR 2 VIEWS COMPARISON:  Pelvis study today FINDINGS: Fracture through the proximal right femoral shaft just below the intertrochanteric region. Mild varus angulation. No subluxation or dislocation. Right hip joint and knee joint unremarkable. IMPRESSION: Fracture through the proximal right femoral shaft with varus angulation. Electronically Signed   By: Rolm Baptise M.D.   On: 05/09/2019 21:36    EKG: Independently reviewed. Sinus rhythm, slight T wave flattening lead I and aVL, QTC 453, no acute ischemic changes.  Not significantly changed from prior.  Assessment/Plan Principal Problem:   Closed fracture of proximal end of femur, right, initial encounter ([place]) Active Problems:   Anxiety and depression   Essential hypertension   Type 1 diabetes mellitus with hyperlipidemia ([facility])   CAD (coronary artery disease), native coronary artery  Sivan Gallogly is a 53 y.o. female with medical history  significant for CAD  s/p CABG, type 1 diabetes with neuropathy and gastroparesis, hypertension, and depression with anxiety who is admitted with right proximal femur fracture.  Closed fracture right proximal femur: Occurring after a mechanical fall.  Orthopedics consulted for eventual right hip repair. Based on the revised cardiac risk index patient has a 15% preoperative 30-day risk of death, MI, or cardiac arrest given her history of CAD, insulin dependence, and pulmonary edema seen on chest x-ray.  Will order updated echocardiogram, give IV Lasix once now, and request cardiology evaluation to ensure she is optimized prior to surgery. -Continue as needed pain control -Obtain type and screen -Hold pharmacologic VTE prophylaxis pending surgery -Keep n.p.o. after midnight -Cardiology assessment requested via epic -Orthopedics following -Screening SARS-CoV-2 test is pending  CAD s/p CABG: Chronic and appears to be stable from the standpoint.  She denies any chest pain.  EKG is without any acute ischemic changes. -Will hold aspirin pending surgical intervention -Continue Lopressor -Does not appear to be on statin therapy, consider adding prior to discharge  Pulmonary venous congestion: Seen on chest x-ray.  Post CABG transesophageal echocardiogram from 01/14/2018 showed an EF of 50-55% with normal LV diastolic function, no valvular abnormality noted.  Patient uses Lasix as needed on an outpatient basis.  She has some nonpitting edema around the ankles at time of admission. -Will give IV Lasix 20 mg once now -Update echocardiogram -Monitor strict I/O's, daily weights  Type 1 diabetes with history of gastroparesis: A1c 8.8 on 04/15/2019.  Continue home Lantus 8 units twice daily, add sliding scale insulin and adjust as needed.  Iron deficiency anemia: Hemoglobin 8.6 on admission.  Baseline appears to fluctuate from 8.8-11 over the last year.  No obvious bleeding noted.  Will check ferritin,  repeat hemoglobin in a.m.  Transfuse PRBC and iron as needed.  Leukocytosis: Likely reactive from acute hip fracture.  Continue to monitor.  Hypertension: Resume home losartan and Lopressor.  Anxiety/depression: She is anxious on admission.  Will resume home BuSpar and order IV Ativan 1 mg once as needed for anxiety.  DVT prophylaxis: SCDs Code Status: Full code, confirmed with patient Family Communication: Discussed with patient's family friend at bedside Disposition Plan: Pending clinical progress Consults called: Orthopedics Admission status: Inpatient for management of right hip fracture.   Zada Finders MD [facility]  If 7PM-7AM, please contact night-coverage [email or web address]  05/09/2019, 11:46 PM

## 2019-05-09 NOTE — ED Provider Notes (Signed)
[facility] Emergency Department Provider Note ____________________________________________   First MD Initiated Contact with Patient 05/09/19 2008     (approximate)  I have reviewed the triage vital signs and the nursing notes.   HISTORY  Chief Complaint Fall, Hip Pain (right hip), and Leg Pain (right leg)    HPI [name] is a 53 y.o. female with PMH as noted below who presents with right hip and leg pain after a fall from a seated position.  The patient states that she fell sideways off of the edge of a bench.  She denies hitting her head and has no chest, back, or abdominal pain.  She has been unable to put weight on the leg.  Past Medical History:  Diagnosis Date  . AKI (acute kidney injury) ([place])   . Anemia, iron deficiency   . Anxiety and depression 05/18/2015  . CAD (coronary artery disease), native coronary artery 01/11/2018   s/p CABG - Non-STEMI with severe three-vessel disease noted July 2019  . Depression   . Diabetic gastroparesis ([place])   . Diabetic neuropathy, type I diabetes mellitus ([place]) 05/18/2015  . DKA, type 1 ([place]) 03/18/2016  . Essential hypertension   . Gastroparesis   . GERD (gastroesophageal reflux disease)   . HLD (hyperlipidemia)   . MDD (major depressive disorder), single episode, severe , no psychosis ([place])   . Tardive dyskinesia   . Vitamin B12 deficiency 08/16/2015    Patient Active Problem List   Diagnosis Date Noted  . CAD (coronary artery disease) 03/06/2019  . Acute encephalopathy 12/22/2018  . Diabetic ketoacidosis ([place]) 04/30/2018  . Gastroparesis 04/13/2018  . DKA (diabetic ketoacidosis) ([place]) 03/31/2018  . S/P CABG x 3 01/14/2018  . CAD (coronary artery disease), native coronary artery 01/11/2018  . Dehydration   . MDD (major depressive disorder), single episode, severe , no psychosis ([place])   . DKA (diabetic ketoacidoses) ([place]) 06/16/2017  . Tardive dyskinesia   . DKA, type 1 ([place]) 03/18/2016  .  Noncompliance with treatment plan 03/13/2016  . Essential hypertension 01/24/2016  . HLD (hyperlipidemia)   . GERD (gastroesophageal reflux disease)   . AKI (acute kidney injury) ([place])   . Anemia, iron deficiency   . Vitamin B12 deficiency 08/16/2015  . Diabetic gastroparesis ([place])   . Diabetic neuropathy, type I diabetes mellitus ([place]) 05/18/2015  . Anxiety and depression 05/18/2015    Past Surgical History:  Procedure Laterality Date  . CARDIAC SURGERY    . COLONOSCOPY  09/27/2014   at [facility]  . CORONARY ARTERY BYPASS GRAFT N/A 01/14/2018   Procedure: CORONARY ARTERY BYPASS GRAFTING (CABG)X3, RIGHT AND LEFT SAPHENOUS VEIN HARVEST, MAMMARY TAKE DOWN. MAMMARY TO LAD, SVG TO PD, SVG TO DISTAL CIRC.;  Surgeon: Grace Isaac, MD;  Location: [place];  Service: Open Heart Surgery;  Laterality: N/A;  . ESOPHAGOGASTRODUODENOSCOPY  09/27/2014   at [facility], Dr Rolan Lipa. biospy neg for celiac, neg for H pylori.   . EYE SURGERY    . gailstones    . IR FLUORO GUIDE CV LINE RIGHT  02/01/2017  . IR FLUORO GUIDE CV LINE RIGHT  03/06/2017  . IR FLUORO GUIDE CV LINE RIGHT  03/25/2017  . IR GENERIC HISTORICAL  01/24/2016   IR FLUORO GUIDE CV LINE RIGHT 01/24/2016 Darrell K Allred, PA-C [facility]  . IR GENERIC HISTORICAL  01/24/2016   IR US GUIDE VASC ACCESS RIGHT 01/24/2016 Darrell K Allred, PA-C [facility]  . IR US GUIDE VASC ACCESS  RIGHT  02/01/2017  . IR US GUIDE VASC ACCESS RIGHT  03/06/2017  . IR US GUIDE VASC ACCESS RIGHT  03/25/2017  . IR US GUIDE VASC ACCESS RIGHT  01/20/2019  . IR VENIPUNCTURE 15YRS/OLDER BY MD  01/20/2019  . LEFT HEART CATH AND CORONARY ANGIOGRAPHY N/A 01/07/2018   Procedure: LEFT HEART CATH AND CORONARY ANGIOGRAPHY;  Surgeon: Troy Sine, MD;  Location: [facility];  Service: Cardiovascular;  Laterality: N/A;  . POSTERIOR VITRECTOMY AND MEMBRANE PEEL-LEFT EYE  09/28/2002  . POSTERIOR VITRECTOMY AND MEMBRANE PEEL-RIGHT EYE  03/16/2002  . RETINAL DETACHMENT SURGERY     . TEE WITHOUT CARDIOVERSION N/A 01/14/2018   Procedure: TRANSESOPHAGEAL ECHOCARDIOGRAM (TEE);  Surgeon: Grace Isaac, MD;  Location: [facility];  Service: Open Heart Surgery;  Laterality: N/A;    Prior to Admission medications   Medication Sig Start Date End Date Taking? Authorizing Provider  ammonium lactate (AMLACTIN) 12 % cream Apply topically as needed for dry skin. 07/08/18   Trula Slade, DPM  aspirin EC 81 MG tablet Take 1 tablet (81 mg total) by mouth daily. 06/03/18   Nahser, Wonda Cheng, MD  Blood Glucose Monitoring Suppl (ONETOUCH VERIO) w/Device KIT Use as directed 03/21/18   [name], MD  busPIRone (BUSPAR) 10 MG tablet Take 1 tablet (10 mg total) by mouth 3 (three) times daily. 06/12/18   [name], MD  docusate sodium (COLACE) 100 MG capsule Take 1 capsule (100 mg total) by mouth daily as needed for mild constipation. 02/03/18   Burtis Junes, NP  glucose blood (ACCU-CHEK AVIVA PLUS) test strip Use as instructed to test blood sugar 4 times daily Dx E10.65 04/16/19   Elayne Snare, MD  insulin aspart (NOVOLOG) 100 UNIT/ML injection Inject 10 units under the skin in the morning, and 8-10 units at supper. 04/16/19   Elayne Snare, MD  Insulin Glargine (LANTUS SOLOSTAR) 100 UNIT/ML Solostar Pen Inject 8 units under the skin in the morning and at night. 04/16/19   Elayne Snare, MD  Insulin Syringe-Needle U-100 (BD INSULIN SYRINGE ULTRAFINE) 31G X 5/16" 0.5 ML MISC 1 each by Does not apply route 4 (four) times daily. 04/28/18   Elayne Snare, MD  Lancets [facility] DELICA PLUS [id number]) [place] 1 Units by Does not apply route as directed. 03/21/18   [name], MD  loperamide (IMODIUM) 2 MG capsule Take 1 capsule (2 mg total) by mouth 3 (three) times daily as needed for diarrhea or loose stools. 12/25/18   Aline August, MD  losartan (COZAAR) 25 MG tablet Take 1 tablet (25 mg total) by mouth daily. 03/06/19   Nahser, Wonda Cheng, MD  metoprolol tartrate (LOPRESSOR) 25 MG tablet Take  0.5 tablets (12.5 mg total) by mouth 2 (two) times daily. 10/08/18   Nahser, Wonda Cheng, MD  ondansetron (ZOFRAN ODT) 4 MG disintegrating tablet Take 1 tablet (4 mg total) by mouth every 6 (six) hours as needed for nausea or vomiting. 03/02/19   Ward, Delice Bison, DO  pantoprazole (PROTONIX) 40 MG tablet Take 1 tablet (40 mg total) by mouth daily. 06/12/18   [name], MD  pregabalin (LYRICA) 150 MG capsule Take 1 capsule (150 mg total) by mouth 2 (two) times daily. 01/02/19   Elayne Snare, MD  promethazine (PHENERGAN) 25 MG tablet Take 1 tablet (25 mg total) by mouth every 6 (six) hours as needed for nausea or vomiting. 03/02/19   Ward, Delice Bison, DO  pyridostigmine (MESTINON) 60 MG tablet Take 60 mg  by mouth 3 (three) times daily.    [provider]    Allergies Anesthetics, amide; Penicillins; Buprenorphine hcl; Encainide; and Metoclopramide  Family History  Problem Relation Age of Onset  . Cystic fibrosis Mother   . Hypertension Father   . Diabetes Brother   . Hypertension Maternal Grandmother     Social History Social History   Tobacco Use  . Smoking status: Never Smoker  . Smokeless tobacco: Never Used  Substance Use Topics  . Alcohol use: No  . Drug use: Yes    Types: Marijuana    Comment: occ    Review of Systems  Constitutional: No fever. Eyes: No redness. ENT: No neck pain. Cardiovascular: Denies chest pain. Respiratory: Denies shortness of breath. Gastrointestinal: No abdominal pain. Genitourinary: Negative for flank pain.  Musculoskeletal: Negative for back pain.  Positive for right hip and knee pain. Skin: Negative for rash. Neurological: Negative for focal weakness or numbness.   ____________________________________________   PHYSICAL EXAM:  VITAL SIGNS: ED Triage Vitals  Enc Vitals Group     BP 05/09/19 2016 (!) 136/59     Pulse Rate 05/09/19 2016 78     Resp 05/09/19 2016 (!) 28     Temp 05/09/19 2016 98.7 F (37.1 C)     Temp Source  05/09/19 2016 Oral     SpO2 05/09/19 2016 92 %     Weight 05/09/19 2017 162 lb (73.5 kg)     Height 05/09/19 2017 '5\' 1"'$  (1.549 m)     Head Circumference --      Peak Flow --      Pain Score 05/09/19 2017 10     Pain Loc --      Pain Edu? --      Excl. in [place]? --     Constitutional: Alert and oriented.  Uncomfortable appearing but in no acute distress. Eyes: Conjunctivae are normal.  Head: Atraumatic. Nose: No congestion/rhinnorhea. Mouth/Throat: Mucous membranes are moist.   Neck: Normal range of motion.  Cardiovascular: Normal rate, regular rhythm. Good peripheral circulation. Respiratory: Tachypneic. Gastrointestinal: Soft and nontender. No distention.  Genitourinary: No flank tenderness. Musculoskeletal: No lower extremity edema.  Extremities warm and well perfused.  Tenderness to right hip area and pain on any range of motion of the right hip or knee.  Right ankle and foot nontender.  2+ DP pulse. Neurologic:  Normal speech and language.  Motor intact in all extremities. Skin:  Skin is warm and dry. No rash noted. Psychiatric: Anxious appearing.  ____________________________________________   LABS (all labs ordered are listed, but only abnormal results are displayed)  Labs Reviewed  SARS CORONAVIRUS 2 (TAT 6-24 HRS)  BASIC METABOLIC PANEL  CBC WITH DIFFERENTIAL/PLATELET   ____________________________________________  EKG   ____________________________________________  RADIOLOGY  XR R femur: Proximal right femoral shaft fracture XR R pelvis: No acute fracture CXR: Possible mild CHF with no other acute abnormality  ____________________________________________   PROCEDURES  Procedure(s) performed: No  Procedures  Critical Care performed: No ____________________________________________   INITIAL IMPRESSION / ASSESSMENT AND PLAN / ED COURSE  Pertinent labs & imaging results that were available during my care of the patient were reviewed by me and  considered in my medical decision making (see chart for details).  53 year old female with PMH as noted above presents with right hip and knee pain after a fall from a bench.  The patient states she was unable to bear weight.  She denies hitting her head, and has no back,  abdominal, or chest pain.  On exam the patient appears uncomfortable and is tachypneic, however when asked she states that she is not short of breath but is breathing this way because of the pain.  The other vital signs are normal.  She has tenderness and pain on range of motion of the right hip and knee with no obvious deformity or shortening.  The right lower extremities neuro/vascular intact.  We will obtain x-rays to evaluate for bony injury.  ----------------------------------------- 10:01 PM on 05/09/2019 -----------------------------------------  X-rays reveal a proximal femoral shaft fracture.  I consulted Dr. Harlow Mares from orthopedics who will evaluate the patient and plan for likely surgery tomorrow.  I discussed the patient with the hospitalist Dr. Posey Pronto for admission. ____________________________________________   FINAL CLINICAL IMPRESSION(S) / ED DIAGNOSES  Final diagnoses:  Closed fracture of right femur, unspecified fracture morphology, initial encounter ([place])      NEW MEDICATIONS STARTED DURING THIS VISIT:  New Prescriptions   No medications on file     Note:  This document was prepared using Dragon voice recognition software and may include unintentional dictation errors.    Arta Silence, MD 05/09/19 2202

## 2019-05-09 NOTE — ED Triage Notes (Signed)
Pt arrives to ED from [place] via [facility] with c/c of witnessed fall and acute right hip/leg pain. Pt was sitting on bench at [employer] and slid over to let someone sit down. She states she fell off the edge of the bench and landed on her right hip. Denies hitting head, LoC. Pt in obvious distress, tachypnea. EMS reports transport vitals of 151/68, p80, SPo2 95%, CBG 76.

## 2019-05-09 NOTE — Consult Note (Signed)
Plan a right hip fracture repair tomorrow morning if cleared by medical team. Full consult note to follow.

## 2019-05-10 ENCOUNTER — Encounter: Payer: Self-pay | Admitting: Anesthesiology

## 2019-05-10 ENCOUNTER — Inpatient Hospital Stay: Payer: Medicare HMO | Admitting: Anesthesiology

## 2019-05-10 ENCOUNTER — Encounter
Admission: EM | Disposition: A | Discharge status: Skilled Nursing Facility | Payer: Self-pay | Source: Home / Self Care | Attending: Internal Medicine

## 2019-05-10 ENCOUNTER — Inpatient Hospital Stay: Payer: Medicare HMO

## 2019-05-10 DIAGNOSIS — I10 Essential (primary) hypertension: Secondary | ICD-10-CM

## 2019-05-10 DIAGNOSIS — F419 Anxiety disorder, unspecified: Secondary | ICD-10-CM

## 2019-05-10 DIAGNOSIS — Z01810 Encounter for preprocedural cardiovascular examination: Secondary | ICD-10-CM

## 2019-05-10 DIAGNOSIS — E1069 Type 1 diabetes mellitus with other specified complication: Secondary | ICD-10-CM

## 2019-05-10 DIAGNOSIS — I2510 Atherosclerotic heart disease of native coronary artery without angina pectoris: Secondary | ICD-10-CM

## 2019-05-10 DIAGNOSIS — F329 Major depressive disorder, single episode, unspecified: Secondary | ICD-10-CM

## 2019-05-10 DIAGNOSIS — E785 Hyperlipidemia, unspecified: Secondary | ICD-10-CM

## 2019-05-10 HISTORY — PX: INTRAMEDULLARY (IM) NAIL INTERTROCHANTERIC: SHX5875

## 2019-05-10 LAB — FERRITIN: Ferritin: 55 ng/mL (ref 11–307)

## 2019-05-10 LAB — CREATININE, SERUM
Creatinine, Ser: 1.17 mg/dL — ABNORMAL HIGH (ref 0.44–1.00)
GFR calc Af Amer: >60 mL/min (ref >60)
GFR calc non Af Amer: 54 mL/min — ABNORMAL LOW (ref >60)

## 2019-05-10 LAB — CBC
HCT: 29.8 % — ABNORMAL LOW (ref 36.0–46.0)
HCT: 31.6 % — ABNORMAL LOW (ref 36.0–46.0)
Hemoglobin: 8.5 g/dL — ABNORMAL LOW (ref 12.0–15.0)
Hemoglobin: 9.8 g/dL — ABNORMAL LOW (ref 12.0–15.0)
MCH: 22.5 pg — ABNORMAL LOW (ref 26.0–34.0)
MCH: 24.6 pg — ABNORMAL LOW (ref 26.0–34.0)
MCHC: 28.5 g/dL — ABNORMAL LOW (ref 30.0–36.0)
MCHC: 31 g/dL (ref 30.0–36.0)
MCV: 79 fL — ABNORMAL LOW (ref 80.0–100.0)
MCV: 79.2 fL — ABNORMAL LOW (ref 80.0–100.0)
Platelets: 326 10*3/uL (ref 150–400)
Platelets: 402 10*3/uL — ABNORMAL HIGH (ref 150–400)
RBC: 3.77 MIL/uL — ABNORMAL LOW (ref 3.87–5.11)
RBC: 3.99 MIL/uL (ref 3.87–5.11)
RDW: 20.4 % — ABNORMAL HIGH (ref 11.5–15.5)
RDW: 23.6 % — ABNORMAL HIGH (ref 11.5–15.5)
WBC: 11.9 10*3/uL — ABNORMAL HIGH (ref 4.0–10.5)
WBC: 14.6 10*3/uL — ABNORMAL HIGH (ref 4.0–10.5)
nRBC: 0 % (ref 0.0–0.2)
nRBC: 0 % (ref 0.0–0.2)

## 2019-05-10 LAB — BASIC METABOLIC PANEL
Anion gap: 8 (ref 5–15)
BUN: 18 mg/dL (ref 6–20)
CO2: 25 mmol/L (ref 22–32)
Calcium: 8.8 mg/dL — ABNORMAL LOW (ref 8.9–10.3)
Chloride: 104 mmol/L (ref 98–111)
Creatinine, Ser: 1.25 mg/dL — ABNORMAL HIGH (ref 0.44–1.00)
GFR calc Af Amer: 58 mL/min — ABNORMAL LOW (ref >60)
GFR calc non Af Amer: 50 mL/min — ABNORMAL LOW (ref >60)
Glucose, Bld: 153 mg/dL — ABNORMAL HIGH (ref 70–99)
Potassium: 4.3 mmol/L (ref 3.5–5.1)
Sodium: 137 mmol/L (ref 135–145)

## 2019-05-10 LAB — GLUCOSE, CAPILLARY
Glucose-Capillary: 285 mg/dL — ABNORMAL HIGH (ref 70–99)
Glucose-Capillary: 127 mg/dL — ABNORMAL HIGH (ref 70–99)
Glucose-Capillary: 167 mg/dL — ABNORMAL HIGH (ref 70–99)
Glucose-Capillary: 286 mg/dL — ABNORMAL HIGH (ref 70–99)

## 2019-05-10 LAB — VITAMIN D 25 HYDROXY (VIT D DEFICIENCY, FRACTURES): Vit D, 25-Hydroxy: 8.24 ng/mL — ABNORMAL LOW (ref 30–100)

## 2019-05-10 LAB — SURGICAL PCR SCREEN
MRSA, PCR: NEGATIVE
Staphylococcus aureus: NEGATIVE

## 2019-05-10 LAB — ABO/RH: ABO/RH(D): O POS

## 2019-05-10 LAB — SARS CORONAVIRUS 2 (TAT 6-24 HRS): SARS Coronavirus 2: NEGATIVE

## 2019-05-10 LAB — PREPARE RBC (CROSSMATCH)

## 2019-05-10 LAB — BRAIN NATRIURETIC PEPTIDE: B Natriuretic Peptide: 123 pg/mL — ABNORMAL HIGH (ref 0.0–100.0)

## 2019-05-10 LAB — SARS CORONAVIRUS 2 BY RT PCR (HOSPITAL ORDER, PERFORMED IN ~~LOC~~ HOSPITAL LAB): SARS Coronavirus 2: NEGATIVE

## 2019-05-10 SURGERY — INSERTION, INTRAMEDULLARY ROD, FEMUR
Anesthesia: Choice | Laterality: Right

## 2019-05-10 SURGERY — FIXATION, FRACTURE, INTERTROCHANTERIC, WITH INTRAMEDULLARY ROD
Anesthesia: Spinal | Laterality: Right

## 2019-05-10 MED ORDER — PHENYLEPHRINE HCL (PRESSORS) 10 MG/ML IV SOLN
INTRAVENOUS | Status: DC | PRN
  Administered 2019-05-10: 300 ug via INTRAVENOUS
  Administered 2019-05-10 (×13): 100 ug via INTRAVENOUS

## 2019-05-10 MED ORDER — KETAMINE HCL 10 MG/ML IJ SOLN
INTRAMUSCULAR | Status: DC | PRN
  Administered 2019-05-10: 15 mg via INTRAVENOUS
  Administered 2019-05-10: 20 mg via INTRAVENOUS

## 2019-05-10 MED ORDER — TRANEXAMIC ACID-NACL 1000-0.7 MG/100ML-% IV SOLN
INTRAVENOUS | Status: AC | PRN
  Administered 2019-05-10: 2000 mg via INTRAVENOUS

## 2019-05-10 MED ORDER — DOCUSATE SODIUM 100 MG PO CAPS
100.0000 mg | ORAL_CAPSULE | Freq: Two times a day (BID) | ORAL | Status: DC
  Administered 2019-05-10 – 2019-05-15 (×10): 100 mg via ORAL
  Filled 2019-05-10 (×11): qty 1

## 2019-05-10 MED ORDER — EPHEDRINE SULFATE 50 MG/ML IJ SOLN
INTRAMUSCULAR | Status: DC | PRN
  Administered 2019-05-10 (×2): 5 mg via INTRAVENOUS
  Administered 2019-05-10: 25 mg via INTRAVENOUS

## 2019-05-10 MED ORDER — BUPIVACAINE-EPINEPHRINE 0.25% -1:200000 IJ SOLN
INTRAMUSCULAR | Status: DC | PRN
  Administered 2019-05-10: 10 mL

## 2019-05-10 MED ORDER — CLINDAMYCIN PHOSPHATE 900 MG/50ML IV SOLN
900.0000 mg | INTRAVENOUS | Status: AC
  Administered 2019-05-10: 900 mg via INTRAVENOUS
  Filled 2019-05-10 (×2): qty 50

## 2019-05-10 MED ORDER — TRANEXAMIC ACID 1000 MG/10ML IV SOLN
INTRAVENOUS | Status: AC
  Filled 2019-05-10: qty 20

## 2019-05-10 MED ORDER — SODIUM CHLORIDE (PF) 0.9 % IJ SOLN
INTRAMUSCULAR | Status: AC
  Filled 2019-05-10: qty 50

## 2019-05-10 MED ORDER — PROPOFOL 10 MG/ML IV BOLUS
INTRAVENOUS | Status: AC
  Filled 2019-05-10: qty 20

## 2019-05-10 MED ORDER — SODIUM CHLORIDE 0.9% IV SOLUTION
Freq: Once | INTRAVENOUS | Status: AC
  Administered 2019-05-10: 17:00:00 via INTRAVENOUS

## 2019-05-10 MED ORDER — EPHEDRINE SULFATE 50 MG/ML IJ SOLN
INTRAMUSCULAR | Status: AC
  Filled 2019-05-10: qty 1

## 2019-05-10 MED ORDER — PROCHLORPERAZINE EDISYLATE 10 MG/2ML IJ SOLN
10.0000 mg | Freq: Four times a day (QID) | INTRAMUSCULAR | Status: DC | PRN
  Administered 2019-05-10: 10 mg via INTRAVENOUS
  Filled 2019-05-10 (×2): qty 2

## 2019-05-10 MED ORDER — FENTANYL CITRATE (PF) 100 MCG/2ML IJ SOLN
INTRAMUSCULAR | Status: AC
  Filled 2019-05-10: qty 2

## 2019-05-10 MED ORDER — TRANEXAMIC ACID 1000 MG/10ML IV SOLN
2000.0000 mg | Freq: Once | INTRAVENOUS | Status: DC
  Filled 2019-05-10: qty 20

## 2019-05-10 MED ORDER — KETAMINE HCL 50 MG/ML IJ SOLN
INTRAMUSCULAR | Status: AC
  Filled 2019-05-10: qty 10

## 2019-05-10 MED ORDER — FENTANYL CITRATE (PF) 100 MCG/2ML IJ SOLN
INTRAMUSCULAR | Status: DC | PRN
  Administered 2019-05-10 (×2): 25 ug via INTRAVENOUS

## 2019-05-10 MED ORDER — OXYCODONE HCL 5 MG/5ML PO SOLN: 5.0000 mg | Freq: Once | ORAL | Status: DC | PRN

## 2019-05-10 MED ORDER — BUPIVACAINE HCL (PF) 0.5 % IJ SOLN
INTRAMUSCULAR | Status: AC
  Filled 2019-05-10: qty 10

## 2019-05-10 MED ORDER — OXYCODONE HCL 5 MG PO TABS: 5.0000 mg | ORAL_TABLET | Freq: Once | ORAL | Status: DC | PRN

## 2019-05-10 MED ORDER — PROPOFOL 500 MG/50ML IV EMUL
INTRAVENOUS | Status: DC | PRN
  Administered 2019-05-10: 100 ug/kg/min via INTRAVENOUS

## 2019-05-10 MED ORDER — ONDANSETRON HCL 4 MG PO TABS: 4.0000 mg | ORAL_TABLET | Freq: Four times a day (QID) | ORAL | Status: DC | PRN

## 2019-05-10 MED ORDER — MIDAZOLAM HCL 2 MG/2ML IJ SOLN
INTRAMUSCULAR | Status: DC | PRN
  Administered 2019-05-10: 2 mg via INTRAVENOUS

## 2019-05-10 MED ORDER — FENTANYL CITRATE (PF) 100 MCG/2ML IJ SOLN: 25.0000 ug | INTRAMUSCULAR | Status: DC | PRN

## 2019-05-10 MED ORDER — EPINEPHRINE PF 1 MG/ML IJ SOLN
INTRAMUSCULAR | Status: AC
  Filled 2019-05-10: qty 1

## 2019-05-10 MED ORDER — PROPOFOL 10 MG/ML IV BOLUS
INTRAVENOUS | Status: DC | PRN
  Administered 2019-05-10: 20 mg via INTRAVENOUS

## 2019-05-10 MED ORDER — LACTATED RINGERS IV SOLN
INTRAVENOUS | Status: DC | PRN
  Administered 2019-05-10: 12:00:00 via INTRAVENOUS

## 2019-05-10 MED ORDER — MIDAZOLAM HCL 2 MG/2ML IJ SOLN
INTRAMUSCULAR | Status: AC
  Filled 2019-05-10: qty 2

## 2019-05-10 MED ORDER — ENOXAPARIN SODIUM 40 MG/0.4ML ~~LOC~~ SOLN
40.0000 mg | SUBCUTANEOUS | Status: DC
  Administered 2019-05-11 – 2019-05-15 (×5): 40 mg via SUBCUTANEOUS
  Filled 2019-05-10 (×5): qty 0.4

## 2019-05-10 MED ORDER — ROCURONIUM BROMIDE 50 MG/5ML IV SOLN
INTRAVENOUS | Status: AC
  Filled 2019-05-10: qty 1

## 2019-05-10 MED ORDER — BACITRACIN 50000 UNITS IM SOLR
INTRAMUSCULAR | Status: AC
  Filled 2019-05-10: qty 1

## 2019-05-10 MED ORDER — SODIUM CHLORIDE 0.9 % IR SOLN
Status: DC | PRN
  Administered 2019-05-10: 75 mL

## 2019-05-10 MED ORDER — BUPIVACAINE HCL (PF) 0.25 % IJ SOLN
INTRAMUSCULAR | Status: AC
  Filled 2019-05-10: qty 30

## 2019-05-10 MED ORDER — ONDANSETRON HCL 4 MG/2ML IJ SOLN
4.0000 mg | Freq: Four times a day (QID) | INTRAMUSCULAR | Status: DC | PRN
  Filled 2019-05-10: qty 2

## 2019-05-10 MED ORDER — SUGAMMADEX SODIUM 200 MG/2ML IV SOLN
INTRAVENOUS | Status: AC
  Filled 2019-05-10: qty 2

## 2019-05-10 MED ORDER — LACTATED RINGERS IV SOLN
INTRAVENOUS | Status: DC
  Administered 2019-05-10: 23:00:00 via INTRAVENOUS

## 2019-05-10 SURGICAL SUPPLY — 41 items
APL PRP STRL LF DISP 70% ISPRP (MISCELLANEOUS) ×1
BIT DRILL SHORT 4.2 (BIT) IMPLANT
BLADE 85 HELICAL TFNA (Anchor) ×1 IMPLANT
BNDG COHESIVE 4X5 TAN STRL (GAUZE/BANDAGES/DRESSINGS) ×3 IMPLANT
BRUSH SCRUB EZ  4% CHG (MISCELLANEOUS)
BRUSH SCRUB EZ 4% CHG (MISCELLANEOUS) ×2 IMPLANT
CANISTER SUCT 1200ML W/VALVE (MISCELLANEOUS) ×2 IMPLANT
CHLORAPREP W/TINT 26 (MISCELLANEOUS) ×2 IMPLANT
COVER WAND RF STERILE (DRAPES) ×2 IMPLANT
DRAPE 3/4 80X56 (DRAPES) ×2 IMPLANT
DRAPE U-SHAPE 47X51 STRL (DRAPES) ×2 IMPLANT
DRILL BIT SHORT 4.2 (BIT) ×2
DRSG AQUACEL AG ADV 3.5X 4 (GAUZE/BANDAGES/DRESSINGS) ×2 IMPLANT
DRSG AQUACEL AG ADV 3.5X10 (GAUZE/BANDAGES/DRESSINGS) ×1 IMPLANT
DRSG OPSITE POSTOP 3X4 (GAUZE/BANDAGES/DRESSINGS) ×2 IMPLANT
DRSG OPSITE POSTOP 4X6 (GAUZE/BANDAGES/DRESSINGS) IMPLANT
DRSG OPSITE POSTOP 4X8 (GAUZE/BANDAGES/DRESSINGS) IMPLANT
ELECT REM PT RETURN 9FT ADLT (ELECTROSURGICAL) ×2
ELECTRODE REM PT RTRN 9FT ADLT (ELECTROSURGICAL) ×1 IMPLANT
GAUZE XEROFORM 1X8 LF (GAUZE/BANDAGES/DRESSINGS) ×2 IMPLANT
GLOVE INDICATOR 8.0 STRL GRN (GLOVE) ×2 IMPLANT
GLOVE SURG ORTHO 8.0 STRL STRW (GLOVE) ×4 IMPLANT
GOWN STRL REUS W/ TWL LRG LVL3 (GOWN DISPOSABLE) ×1 IMPLANT
GOWN STRL REUS W/ TWL XL LVL3 (GOWN DISPOSABLE) ×1 IMPLANT
GOWN STRL REUS W/TWL LRG LVL3 (GOWN DISPOSABLE) ×2
GOWN STRL REUS W/TWL XL LVL3 (GOWN DISPOSABLE) ×2
GUIDEWIRE 3.2X400 (WIRE) ×2 IMPLANT
KIT PATIENT CARE HANA TABLE (KITS) ×2 IMPLANT
KIT TURNOVER CYSTO (KITS) ×2 IMPLANT
MAT ABSORB  FLUID 56X50 GRAY (MISCELLANEOUS) ×1
MAT ABSORB FLUID 56X50 GRAY (MISCELLANEOUS) ×1 IMPLANT
NAIL 9MM/130TI CANN 340MM RT (Nail) ×1 IMPLANT
NS IRRIG 1000ML POUR BTL (IV SOLUTION) ×2 IMPLANT
PACK HIP COMPR (MISCELLANEOUS) ×2 IMPLANT
REAMER ROD DEEP FLUTE 2.5X950 (INSTRUMENTS) ×1 IMPLANT
SCREW LOCKING 5.0MMX40MM (Screw) ×1 IMPLANT
STAPLER SKIN PROX 35W (STAPLE) ×2 IMPLANT
SUT VIC AB 0 CT1 36 (SUTURE) ×2 IMPLANT
SUT VIC AB 2-0 CT1 27 (SUTURE) ×2
SUT VIC AB 2-0 CT1 TAPERPNT 27 (SUTURE) ×1 IMPLANT
TOWEL OR 17X26 4PK STRL BLUE (TOWEL DISPOSABLE) ×2 IMPLANT

## 2019-05-10 NOTE — Plan of Care (Signed)
  Problem: Education: Goal: Verbalization of understanding the information provided (i.e., activity precautions, restrictions, etc) will improve Outcome: Progressing Goal: Individualized Educational Video(s) Outcome: Progressing   Problem: Activity: Goal: Ability to ambulate and perform ADLs will improve Outcome: Progressing   Problem: Clinical Measurements: Goal: Postoperative complications will be avoided or minimized Outcome: Progressing   Problem: Self-Concept: Goal: Ability to maintain and perform role responsibilities to the fullest extent possible will improve Outcome: Progressing   Problem: Pain Management: Goal: Pain level will decrease Outcome: Progressing   

## 2019-05-10 NOTE — Progress Notes (Signed)
PROGRESS NOTE  [name] [id number] DOB: Dec 07, 1965 DOA: 05/09/2019 PCP: [name], MD   LOS: 1 day   Brief Narrative / Interim history: 53 year old female with type 1 diabetes mellitus since age 89, coronary artery disease with CABG in 2019, neuropathy/gastroparesis, hypertension, depression, anxiety who came into the ED after right hip pain following a fall.  X-ray in the ED showed a fracture to the proximal right femoral shaft with varus angulation.  Chest x-ray did show some changes of venous congestion without focal consolidation or pleural effusion.  Orthopedic surgery was consulted, as well as cardiology given fluid overload  Subjective / 24h Interval events: She seems to be scared this morning, she does not want a move because that causes her pain.  She denies any chest pain.  She reports that she has been gaining about 30 pounds in the last several months.  She is also noticed that her legs have become progressively more swollen over the same period of time.  Assessment & Plan: Principal Problem Closed fracture of the right proximal femur -After mechanical fall, no syncopal episodes.  Orthopedic surgery consulted and evaluated patient, plan for hip repair -2D echo and cardiology consult were requested by admitting physician, will follow results/recommendations -She was given IV Lasix x1 on admission  Active Problems Coronary artery disease status post CABG in 2019 -This is chronic, she appears stable from this standpoint.  EKG is without acute ischemic changes -Aspirin is on hold pending surgical evaluation, continue Lopressor -May need statin at discharge  Fluid overload/pulmonary venous congestion -Respiratory status seems to be at baseline however she does have a degree of fluid overload.  Await cardiology input but eventually will need some form of diuresis  -2D echo pending  Type 1 diabetes mellitus with history of gastroparesis -Continue home Lantus,  sliding scale.  Currently n.p.o. -Most recent A1c 8.8 last month  Iron deficiency anemia -Hemoglobin 8.6 on admission, will monitor periop  Leukocytosis -Likely reactive, monitor  Hypertension -Resume home Lopressor, hold losartan preop  Anxiety/depression -Continue home medications  Scheduled Meds: . busPIRone  10 mg Oral TID  . insulin aspart  0-15 Units Subcutaneous TID WC  . insulin aspart  0-5 Units Subcutaneous QHS  . insulin glargine  8 Units Subcutaneous QHS  . losartan  25 mg Oral Daily  . metoprolol tartrate  12.5 mg Oral BID  . pregabalin  150 mg Oral BID  . tranexamic acid (CYKLOKAPRON) topical -INTRAOP  2,000 mg Topical Once   Continuous Infusions: . clindamycin (CLEOCIN) IV     PRN Meds:.HYDROcodone-acetaminophen, LORazepam, morphine injection, ondansetron (ZOFRAN) IV, senna-docusate  DVT prophylaxis: SCDs Code Status: Full code Family Communication: Discussed with patient Disposition Plan: To be determined  Consultants:  Orthopedic surgery Cardiology  Procedures:  2D echo: Pending  Microbiology  SARS-CoV-2 11/14-pending SARS-CoV-2 11/15-pending MRSA PCR-negative  Antimicrobials: None   Objective: Vitals:   05/09/19 2301 05/10/19 0022 05/10/19 0522 05/10/19 0819  BP: (!) 124/56 (!) 146/57 (!) 129/51 (!) 120/50  Pulse: 77 79 78 71  Resp: (!) 26 (!) 24 20 17   Temp:  97.9 F (36.6 C) 98.4 F (36.9 C) 98.5 F (36.9 C)  TempSrc:  Oral Oral Oral  SpO2: 99% 100% 100% 100%  Weight:      Height:        Intake/Output Summary (Last 24 hours) at 05/10/2019 1004 Last data filed at 05/10/2019 0827 Gross per 24 hour  Intake 0 ml  Output 1600 ml  Net -1600 ml  Filed Weights   05/09/19 2017  Weight: 73.5 kg    Examination:  Constitutional: NAD Eyes: lids and conjunctivae normal, no scleral icterus ENMT: Mucous membranes are moist.  Neck: normal, supple Respiratory: Mostly clear on anterior auscultation, diminished at the bases, no  significant wheezing or crackles heard.  Moves air well. Cardiovascular: Regular rate and rhythm, no murmurs appreciated.  1+ pitting lower extremity edema Abdomen: no tenderness. Bowel sounds positive.  Musculoskeletal: no clubbing / cyanosis.  Skin: no rashes Neurologic: Upper extremities equal strength, deferred lower extremity examination due to hip pain Psychiatric: Normal judgment and insight. Alert and oriented x 3. Normal mood.    Data Reviewed: I have independently reviewed following labs and imaging studies   CBC: Recent Labs  Lab 05/09/19 2317 05/10/19 0034  WBC 15.9* 14.6*  NEUTROABS 13.6*  --   HGB 8.6* 8.5*  HCT 29.8* 29.8*  MCV 77.8* 79.0*  PLT 402* [id number]*   Basic Metabolic Panel: Recent Labs  Lab 05/09/19 2317 05/10/19 0034  NA 137 137  K 4.1 4.3  CL 103 104  CO2 25 25  GLUCOSE 122* 153*  BUN 18 18  CREATININE 1.18* 1.25*  CALCIUM 8.8* 8.8*   Liver Function Tests: No results for input(s): AST, ALT, ALKPHOS, BILITOT, PROT, ALBUMIN in the last 168 hours. Coagulation Profile: No results for input(s): INR, PROTIME in the last 168 hours. HbA1C: No results for input(s): HGBA1C in the last 72 hours. CBG: Recent Labs  Lab 05/10/19 0742  GLUCAP 285*    Recent Results (from the past 240 hour(s))  Surgical pcr screen     Status: None   Collection Time: 05/10/19 12:41 AM   Specimen: Nasal Mucosa; Nasal Swab  Result Value Ref Range Status   MRSA, PCR NEGATIVE NEGATIVE Final   Staphylococcus aureus NEGATIVE NEGATIVE Final    Comment: (NOTE) The Xpert SA Assay (FDA approved for NASAL specimens in patients 78 years of age and older), is one component of a comprehensive surveillance program. It is not intended to diagnose infection nor to guide or monitor treatment. Performed at [facility], [address], [place], [place] [address]      Radiology Studies: Dg Chest 1 View  Result Date: 05/09/2019 CLINICAL DATA:  Recent fall with chest  pain, initial encounter EXAM: CHEST  1 VIEW COMPARISON:  04/27/2019 FINDINGS: Cardiac shadow is stable. Postoperative changes are seen. Diffuse vascular congestion and interstitial edema is noted consistent with early CHF. No pleural effusion is noted. No focal infiltrate is seen. No bony abnormality is noted. IMPRESSION: Changes consistent with early CHF. Electronically Signed   By: Inez Catalina M.D.   On: 05/09/2019 21:38   Dg Pelvis 1-2 Views  Result Date: 05/09/2019 CLINICAL DATA:  Fall.  Right leg pain EXAM: PELVIS - 1-2 VIEW COMPARISON:  None. FINDINGS: Fracture through the proximal right femoral shaft just below the intertrochanteric region. Mild varus angulation. No subluxation or dislocation. Hip joints and SI joints are symmetric. IMPRESSION: Fracture through the proximal right femoral shaft with varus angulation. Electronically Signed   By: Rolm Baptise M.D.   On: 05/09/2019 21:35   Dg Femur, Min 2 Views Right  Result Date: 05/09/2019 CLINICAL DATA:  Fall EXAM: RIGHT FEMUR 2 VIEWS COMPARISON:  Pelvis study today FINDINGS: Fracture through the proximal right femoral shaft just below the intertrochanteric region. Mild varus angulation. No subluxation or dislocation. Right hip joint and knee joint unremarkable. IMPRESSION: Fracture through the proximal right femoral shaft with varus  angulation. Electronically Signed   By: Rolm Baptise M.D.   On: 05/09/2019 21:36    Marzetta Board, MD, PhD [facility]  Between 7 am - 7 pm I am available, please contact me via Amion or Securechat  Between 7 pm - 7 am I am not available, please contact night coverage MD/APP via Amion

## 2019-05-10 NOTE — Anesthesia Preprocedure Evaluation (Deleted)
Anesthesia Evaluation    Airway        Dental   Pulmonary           Cardiovascular hypertension,      Neuro/Psych    GI/Hepatic   Endo/Other  diabetes  Renal/GU      Musculoskeletal   Abdominal   Peds  Hematology   Anesthesia Other Findings Past Medical History: No date: AKI (acute kidney injury) ([place]) No date: Anemia, iron deficiency 05/18/2015: Anxiety and depression 01/11/2018: CAD (coronary artery disease), native coronary artery     Comment:  s/p CABG - Non-STEMI with severe three-vessel disease               noted July 2019 No date: Depression No date: Diabetic gastroparesis ([place]) 05/18/2015: Diabetic neuropathy, type I diabetes mellitus ([place]) 03/18/2016: DKA, type 1 ([place]) No date: Essential hypertension No date: Gastroparesis No date: GERD (gastroesophageal reflux disease) No date: HLD (hyperlipidemia) No date: MDD (major depressive disorder), single episode, severe , no  psychosis ([place]) No date: Tardive dyskinesia 08/16/2015: Vitamin B12 deficiency   Reproductive/Obstetrics                             Anesthesia Physical Anesthesia Plan Anesthesia Quick Evaluation

## 2019-05-10 NOTE — Anesthesia Post-op Follow-up Note (Signed)
Anesthesia QCDR form completed.        

## 2019-05-10 NOTE — Progress Notes (Signed)
15 minute call to floor. 

## 2019-05-10 NOTE — Op Note (Signed)
DATE OF SURGERY:  05/10/2019  TIME: 2:34 PM  PATIENT NAME:  [name]  AGE: 53 y.o.  PRE-OPERATIVE DIAGNOSIS:  right femur fracture  POST-OPERATIVE DIAGNOSIS:  SAME  PROCEDURE:  INTRAMEDULLARY (IM) NAIL INTERTROCHANTRIC  SURGEON:  Lovell Sheehan  EBL:  [id number] cc  COMPLICATIONS:  None apparent  OPERATIVE IMPLANTS: Synthes trochanteric femoral nail  340 mm by 9 mm  with interlocking helical blade  85 mm and distal locking bolt 40 mm  PREOPERATIVE INDICATIONS:  [name] is a 53 y.o. year old who fell and suffered a hip fracture. She was brought into the ER and then admitted and optimized and then elected for surgical intervention.    The risks benefits and alternatives were discussed with the patient including but not limited to the risks of nonoperative treatment, versus surgical intervention including infection, bleeding, nerve injury, malunion, nonunion, hardware prominence, hardware failure, need for hardware removal, blood clots, cardiopulmonary complications, morbidity, mortality, among others, and they were willing to proceed.    OPERATIVE PROCEDURE:  The patient was brought to the operating room and placed in the supine position.  Spinal anesthesia was administered, with a foley. She was placed on the fracture table.  Closed reduction was performed under C-arm guidance. The length of the femur was also measured using fluoroscopy. Time out was then performed after sterile prep and drape. She received preoperative antibiotics.  Incision was made proximal to the greater trochanter. A guidewire was placed in the appropriate position. Confirmation was made on AP and lateral views. The above-named nail was opened. I opened the proximal femur with a reamer. I then placed the nail by hand easily down. I did not need to ream the femur.  Once the nail was completely seated, I placed a guidepin into the femoral head into the center center position through a second incision.  I  measured the length, and then reamed the lateral cortex and up into the head. I then placed the helical blade. Slight compression was applied. Anatomic fixation achieved. Bone quality was poor.  I then secured the proximal interlock. Next using a perfect circle technique a distal locking bolt was placed. I then removed the instruments, and took final C-arm pictures AP and lateral the entire length of the leg. Anatomic reconstruction was achieved, and the wounds were irrigated copiously and closed with Vicryl  followed by staples and dry sterile dressing. Sponge and needle count were correct.   The patient was awakened and returned to PACU in stable and satisfactory condition. There no complications and the patient tolerated the procedure well.  She will be weightbearing as tolerated.    Lovell Sheehan

## 2019-05-10 NOTE — Consult Note (Addendum)
Cardiology Consultation:   Patient ID: [name]; [id number]; 09/14/1965   Admit date: 05/09/2019 Date of Consult: 05/10/2019  Primary Care Provider: [name], MD Primary Cardiologist: Nahser   Patient Profile:   [name] is a 53 y.o. female with a hx of CAD s/p 3-vessel CABG in 12/2017 with LIMA to LAD, SVG to OM and SVG to rPDA, poorly controlled type 1 diabetes with frequent ED visits/admissions with noncompliance, diabetic gastroparesis, iron deficiency anemia, HTN, HLD, tardive dyskinesia, GERD, and anxiety/depression who is being seen today for the evaluation of preoperative cardiac optimization at the request of Dr. Posey Pronto.  History of Present Illness:   [name] was admitted in 12/2017 with with acute MI and DKA. LHC showed multivessel CAD and a reduced EF of 30-35%. She subsequently underwent 3-vessel CABG on 01/14/2018. Surface echo during that admission showed an EF of 23%, normal diastolic function, mild MR, mildly dilated left atrium, trivial TR. TEE at time of surgery showed an EF of 50-55% without any significant valvular abnormality. She has not required any further ischemic evaluations since her bypass surgery. She was last seen in the office in 02/2019 and was doing well from a cardiac perspective.   [place]14 [place] on a bench. She was scooting over to let a friend sit down. However, she did not realize she was already at the end of the bench and fall off, landing on her right hip. She had immediate pain. She did not hit her head for suffer LOC.   Upon the patient's arrival to [facility] they were found to have stable vitals. EKG showed sinus rhythm with nonspecific inferolateral st/t changes as below, CXR showed vascular congestion and interstitial edema consistent with early CHF. Labs showed BNP 123, WBC 15.9-->14.6, HGB 8.6-->8.5, K+ 4.1-->4.3, BUN/SCr 18/1.18-->18/1.25. Pelvis/femoral plain film with fracture through the proximal  right femoral shaft with varus angulation. She received IV Lasix 20 mg upon admission.   Note is taken from prior documentation. Unable to assess the patient as she has already been taken to the OR prior to cardiology consult.    Past Medical History:  Diagnosis Date  . AKI (acute kidney injury) ([place])   . Anemia, iron deficiency   . Anxiety and depression 05/18/2015  . CAD (coronary artery disease), native coronary artery 01/11/2018   s/p CABG - Non-STEMI with severe three-vessel disease noted July 2019  . Depression   . Diabetic gastroparesis ([place])   . Diabetic neuropathy, type I diabetes mellitus ([place]) 05/18/2015  . DKA, type 1 ([place]) 03/18/2016  . Essential hypertension   . Gastroparesis   . GERD (gastroesophageal reflux disease)   . HLD (hyperlipidemia)   . MDD (major depressive disorder), single episode, severe , no psychosis ([place])   . Tardive dyskinesia   . Vitamin B12 deficiency 08/16/2015    Past Surgical History:  Procedure Laterality Date  . CARDIAC SURGERY    . COLONOSCOPY  09/27/2014   at [facility]  . CORONARY ARTERY BYPASS GRAFT N/A 01/14/2018   Procedure: CORONARY ARTERY BYPASS GRAFTING (CABG)X3, RIGHT AND LEFT SAPHENOUS VEIN HARVEST, MAMMARY TAKE DOWN. MAMMARY TO LAD, SVG TO PD, SVG TO DISTAL CIRC.;  Surgeon: Grace Isaac, MD;  Location: [place];  Service: Open Heart Surgery;  Laterality: N/A;  . ESOPHAGOGASTRODUODENOSCOPY  09/27/2014   at [facility], Dr Rolan Lipa. biospy neg for celiac, neg for H pylori.   . EYE SURGERY    . gailstones    .  IR FLUORO GUIDE CV LINE RIGHT  02/01/2017  . IR FLUORO GUIDE CV LINE RIGHT  03/06/2017  . IR FLUORO GUIDE CV LINE RIGHT  03/25/2017  . IR GENERIC HISTORICAL  01/24/2016   IR FLUORO GUIDE CV LINE RIGHT 01/24/2016 Yvette K Allred, PA-C [facility]  . IR GENERIC HISTORICAL  01/24/2016   IR US GUIDE VASC ACCESS RIGHT 01/24/2016 Yvette K Allred, PA-C [facility]  . IR US GUIDE VASC ACCESS RIGHT  02/01/2017  . IR US GUIDE VASC ACCESS  RIGHT  03/06/2017  . IR US GUIDE VASC ACCESS RIGHT  03/25/2017  . IR US GUIDE VASC ACCESS RIGHT  01/20/2019  . IR VENIPUNCTURE 24YRS/OLDER BY MD  01/20/2019  . LEFT HEART CATH AND CORONARY ANGIOGRAPHY N/A 01/07/2018   Procedure: LEFT HEART CATH AND CORONARY ANGIOGRAPHY;  Surgeon: Troy Sine, MD;  Location: [place] CV LAB;  Service: Cardiovascular;  Laterality: N/A;  . POSTERIOR VITRECTOMY AND MEMBRANE PEEL-LEFT EYE  09/28/2002  . POSTERIOR VITRECTOMY AND MEMBRANE PEEL-RIGHT EYE  03/16/2002  . RETINAL DETACHMENT SURGERY    . TEE WITHOUT CARDIOVERSION N/A 01/14/2018   Procedure: TRANSESOPHAGEAL ECHOCARDIOGRAM (TEE);  Surgeon: Grace Isaac, MD;  Location: [place];  Service: Open Heart Surgery;  Laterality: N/A;     Home Meds: Prior to Admission medications   Medication Sig Start Date End Date Taking? Authorizing Provider  ammonium lactate (AMLACTIN) 12 % cream Apply topically as needed for dry skin. 07/08/18   Trula Slade, DPM  aspirin EC 81 MG tablet Take 1 tablet (81 mg total) by mouth daily. 06/03/18   Nahser, Wonda Cheng, MD  Blood Glucose Monitoring Suppl (ONETOUCH VERIO) w/Device KIT Use as directed 03/21/18   [name], MD  busPIRone (BUSPAR) 10 MG tablet Take 1 tablet (10 mg total) by mouth 3 (three) times daily. 06/12/18   [name], MD  docusate sodium (COLACE) 100 MG capsule Take 1 capsule (100 mg total) by mouth daily as needed for mild constipation. 02/03/18   Burtis Junes, NP  glucose blood (ACCU-CHEK AVIVA PLUS) test strip Use as instructed to test blood sugar 4 times daily Dx E10.65 04/16/19   Elayne Snare, MD  insulin aspart (NOVOLOG) 100 UNIT/ML injection Inject 10 units under the skin in the morning, and 8-10 units at supper. 04/16/19   Elayne Snare, MD  Insulin Glargine (LANTUS SOLOSTAR) 100 UNIT/ML Solostar Pen Inject 8 units under the skin in the morning and at night. 04/16/19   Elayne Snare, MD  Insulin Syringe-Needle U-100 (BD INSULIN SYRINGE ULTRAFINE)  31G X 5/16" 0.5 ML MISC 1 each by Does not apply route 4 (four) times daily. 04/28/18   Elayne Snare, MD  Lancets [facility] DELICA PLUS [id number]) Faith 1 Units by Does not apply route as directed. 03/21/18   [name], MD  loperamide (IMODIUM) 2 MG capsule Take 1 capsule (2 mg total) by mouth 3 (three) times daily as needed for diarrhea or loose stools. 12/25/18   Aline August, MD  losartan (COZAAR) 25 MG tablet Take 1 tablet (25 mg total) by mouth daily. 03/06/19   Nahser, Wonda Cheng, MD  metoprolol tartrate (LOPRESSOR) 25 MG tablet Take 0.5 tablets (12.5 mg total) by mouth 2 (two) times daily. 10/08/18   Nahser, Wonda Cheng, MD  ondansetron (ZOFRAN ODT) 4 MG disintegrating tablet Take 1 tablet (4 mg total) by mouth every 6 (six) hours as needed for nausea or vomiting. 03/02/19   Ward, Delice Bison, DO  pantoprazole (Toomsuba)  40 MG tablet Take 1 tablet (40 mg total) by mouth daily. 06/12/18   [name], MD  pregabalin (LYRICA) 150 MG capsule Take 1 capsule (150 mg total) by mouth 2 (two) times daily. 01/02/19   Elayne Snare, MD  promethazine (PHENERGAN) 25 MG tablet Take 1 tablet (25 mg total) by mouth every 6 (six) hours as needed for nausea or vomiting. 03/02/19   Ward, Delice Bison, DO  pyridostigmine (MESTINON) 60 MG tablet Take 60 mg by mouth 3 (three) times daily.    [provider]    Inpatient Medications: Scheduled Meds: . busPIRone  10 mg Oral TID  . insulin aspart  0-15 Units Subcutaneous TID WC  . insulin aspart  0-5 Units Subcutaneous QHS  . insulin glargine  8 Units Subcutaneous QHS  . metoprolol tartrate  12.5 mg Oral BID  . pregabalin  150 mg Oral BID  . tranexamic acid (CYKLOKAPRON) topical -INTRAOP  2,000 mg Topical Once   Continuous Infusions: . clindamycin (CLEOCIN) IV     PRN Meds: HYDROcodone-acetaminophen, LORazepam, morphine injection, ondansetron (ZOFRAN) IV, senna-docusate  Allergies:   Allergies  Allergen Reactions  . Anesthetics, Amide Nausea And Vomiting   . Chlorhexidine   . Penicillins Diarrhea, Nausea And Vomiting and Other (See Comments)    Has patient had a PCN reaction causing immediate rash, facial/tongue/throat swelling, SOB or lightheadedness with hypotension: Yes Has patient had a PCN reaction causing severe rash involving mucus membranes or skin necrosis: No Has patient had a PCN reaction that required hospitalization No Has patient had a PCN reaction occurring within the last 10 years: Yes  If all of the above answers are "NO", then may proceed with Cephalosporin use.   . Buprenorphine Hcl Rash  . Encainide Nausea And Vomiting  . Metoclopramide Other (See Comments)    Dystonia, muscle rigidity.  Patient states she is only allergic to the "pill form, not the IV"    Social History:   Social History   Socioeconomic History  . Marital status: Single    Spouse name: Not on file  . Number of children: 1  . Years of education: Not on file  . Highest education level: Not on file  Occupational History  . Occupation: Unemployed  Social Needs  . Financial resource strain: Not on file  . Food insecurity    Worry: Not on file    Inability: Not on file  . Transportation needs    Medical: Not on file    Non-medical: Not on file  Tobacco Use  . Smoking status: Never Smoker  . Smokeless tobacco: Never Used  Substance and Sexual Activity  . Alcohol use: No  . Drug use: Yes    Types: Marijuana    Comment: occ  . Sexual activity: Yes    Partners: Male    Birth control/protection: None  Lifestyle  . Physical activity    Days per week: Not on file    Minutes per session: Not on file  . Stress: Not on file  Relationships  . Social Herbalist on phone: Not on file    Gets together: Not on file    Attends religious service: Not on file    Active member of club or organization: Not on file    Attends meetings of clubs or organizations: Not on file    Relationship status: Not on file  . Intimate partner violence     Fear of current or ex partner: Not on file  Emotionally abused: Not on file    Physically abused: Not on file    Forced sexual activity: Not on file  Other Topics Concern  . Not on file  Social History Narrative  . Not on file     Family History:   Family History  Problem Relation Age of Onset  . Cystic fibrosis Mother   . Hypertension Father   . Diabetes Brother   . Hypertension Maternal Grandmother     ROS:  Review of Systems  Unable to perform ROS: Other  Patient taken to the OR prior to cardiology consult  Physical Exam/Data:   Vitals:   05/09/19 2301 05/10/19 0022 05/10/19 0522 05/10/19 0819  BP: (!) 124/56 (!) 146/57 (!) 129/51 (!) 120/50  Pulse: 77 79 78 71  Resp: (!) 26 (!) _0 Temp:  97.9 F (36.6 C) 98.4 F (36.9 C) 98.5 F (36.9 C)  TempSrc:  Oral Oral Oral  SpO2: 99% 100% 100% 100%  Weight:      Height:        Intake/Output Summary (Last 24 hours) at 05/10/2019 1117 Last data filed at 05/10/2019 0827 Gross per 24 hour  Intake 0 ml  Output 1600 ml  Net -1600 ml   Filed Weights   05/09/19 2017  Weight: 73.5 kg   Body mass index is 30.61 kg/m.   Physical Exam: Unable to assess as patient taken to OR prior to consultation   EKG:  The EKG was personally reviewed and demonstrates: NSR, 75 bpm, nonspecific inferolateral st/t changes Telemetry:  Telemetry was personally reviewed and demonstrates: not on telemetry   Weights: Filed Weights   05/09/19 2017  Weight: 73.5 kg    Relevant CV Studies: LHC 12/2017:  Ost Ramus to Ramus lesion is 70% stenosed.  Ost LAD lesion is 40% stenosed.  Prox LAD lesion is 50% stenosed.  Prox LAD to Mid LAD lesion is 70% stenosed.  Prox Cx to Mid Cx lesion is 70% stenosed.  Mid Cx to Dist Cx lesion is 60% stenosed.  Mid RCA lesion is 85% stenosed.  Prox RCA lesion is 20% stenosed.  Dist RCA lesion is 50% stenosed.  RPDA lesion is 70% stenosed.   Multi-vessel CAD in this diabetic female  with diffuse disease and diabetic appearing vessels.  There is smooth 40% ostial LAD stenosis, diffuse 50% proximal LAD stenosis, significant eccentric calcification in the mid LAD after the diagonal vessel with narrowing of at least 70% and diffuse distal LAD narrowing; diffusely narrowed proximal to mid ramus intermediate vessel; large left circumflex coronary artery with 70% followed by 60% mid AV groove stenosis after the takeoff of the first marginal vessel; and small caliber RCA with diffuse disease with 20% proximal RCA narrowing,  focal 85% mid stenosis, diffuse 50% narrowing before the PDA takeoff, 70% mid PDA stenosis.  Moderately severe global LV dysfunction with an ejection fraction of 30 to 35%.  LVEDP 23 mm.  RECOMMENDATION: Angiograms will be reviewed with colleagues.  Recommend surgical consultation for consideration of CABG revascularization in this 53 year old diabetic female with a 41-year history of diabetes mellitus.  The patient's tightest lesion is her mid RCA but this would not account for her troponin of 40.47.  Increased medical therapy.  Hydrate post catheterization.  Will reinstitute heparin therapy for anticoagulation 10 hours post sheath removal. __________  2D echo 12/2017: - Left ventricle: The cavity size was normal. Wall thickness was   normal. The estimated ejection fraction was 55%. There  is   hypokinesis of the apicalinferior myocardium. Left ventricular   diastolic function parameters were normal. - Mitral valve: There was mild regurgitation. - Left atrium: The atrium was mildly dilated. - Right atrium: Central venous pressure (est): 8 mm Hg. - Tricuspid valve: There was trivial regurgitation. - Pulmonary arteries: Systolic pressure could not be accurately   estimated. - Pericardium, extracardiac: There was no pericardial effusion.   Laboratory Data:  Chemistry Recent Labs  Lab 05/09/19 2317 05/10/19 0034  NA 137 137  K 4.1 4.3  CL 103 104  CO2  25 25  GLUCOSE 122* 153*  BUN 18 18  CREATININE 1.18* 1.25*  CALCIUM 8.8* 8.8*  GFRNONAA 53* 50*  GFRAA >60 58*  ANIONGAP 9 8    No results for input(s): PROT, ALBUMIN, AST, ALT, ALKPHOS, BILITOT in the last 168 hours. Hematology Recent Labs  Lab 05/09/19 2317 05/10/19 0034  WBC 15.9* 14.6*  RBC 3.83* 3.77*  HGB 8.6* 8.5*  HCT 29.8* 29.8*  MCV 77.8* 79.0*  MCH 22.5* 22.5*  MCHC 28.9* 28.5*  RDW 23.3* 23.6*  PLT 402* 402*   Cardiac EnzymesNo results for input(s): TROPONINI in the last 168 hours. No results for input(s): TROPIPOC in the last 168 hours.  BNP Recent Labs  Lab 05/09/19 2317  BNP 123.0*    DDimer No results for input(s): DDIMER in the last 168 hours.  Radiology/Studies:  Dg Chest 1 View  Result Date: 05/09/2019 IMPRESSION: Changes consistent with early CHF. Electronically Signed   By: Inez Catalina M.D.   On: 05/09/2019 21:38   Dg Pelvis 1-2 Views  Result Date: 05/09/2019 IMPRESSION: Fracture through the proximal right femoral shaft with varus angulation. Electronically Signed   By: Rolm Baptise M.D.   On: 05/09/2019 21:35   Dg Femur, Min 2 Views Right  Result Date: 05/09/2019 IMPRESSION: Fracture through the proximal right femoral shaft with varus angulation. Electronically Signed   By: Rolm Baptise M.D.   On: 05/09/2019 21:36    Assessment and Plan:   1. Preoperative cardiac risk stratification: -Scheduled for surgical repair of right femoral fracture -She is high risk for noncardiac surgery per Revised Cardiac Index -Monitor perioperative fluids with an estimated 11% of adverse outcome -Patient taken to OR prior to cardiac evaluation, unable to provide further recommendations at this time  2. Pulmonary vascular congestion: -Status post IV Lasix 20 mg with documented UOP of 1.6 L -Echo pending, further recommendations pending  3. CAD s/p CABG: -ASA held at time of admission for surgery, recommend resuming when felt safe from a surgical and  anemia perspective  -Continue PTA Lopressor   4. Right femoral fracture: -Secondary to mechanical fall -Orthopedics planning for surgical repair today  5. Iron deficiency anemia: -Likely exacerbated by the above -Monitor -ASA on hold as above   For questions or updates, please contact [place] Please consult [email or web address] for contact info under Cardiology/STEMI.   Signed, Christell Faith, PA-C [place] Pager: [phone] 05/10/2019, 11:17 AM

## 2019-05-10 NOTE — Progress Notes (Signed)
Blood transfusion ended 1703

## 2019-05-10 NOTE — Anesthesia Preprocedure Evaluation (Addendum)
Anesthesia Evaluation  Patient identified by MRN, date of birth, ID band Patient awake    Reviewed: Allergy & Precautions, H&P , NPO status , Patient's Chart, lab work & pertinent test results  Airway Mallampati: III  TM Distance: >3 FB Neck ROM: full   Comment: OM 2-3 FB Dental  (+) Chipped   Pulmonary shortness of breath and with exertion, neg COPD,           Cardiovascular hypertension, (-) angina+ CAD, + CABG and + DOE  (-) Cardiac Stents (-) dysrhythmias      Neuro/Psych neg Seizures PSYCHIATRIC DISORDERS Anxiety Depression negative neurological ROS     GI/Hepatic Neg liver ROS, GERD  Controlled,  Endo/Other  diabetes, Type 1, Insulin Dependent  Renal/GU      Musculoskeletal   Abdominal   Peds  Hematology  (+) Blood dyscrasia (Hgb 8.5), anemia ,   Anesthesia Other Findings Past Medical History: No date: AKI (acute kidney injury) ([place]) No date: Anemia, iron deficiency 05/18/2015: Anxiety and depression 01/11/2018: CAD (coronary artery disease), native coronary artery     Comment:  s/p CABG - Non-STEMI with severe three-vessel disease               noted July 2019 No date: Depression No date: Diabetic gastroparesis ([place]) 05/18/2015: Diabetic neuropathy, type I diabetes mellitus ([place]) 03/18/2016: DKA, type 1 ([facility]) No date: Essential hypertension No date: Gastroparesis No date: GERD (gastroesophageal reflux disease) No date: HLD (hyperlipidemia) No date: MDD (major depressive disorder), single episode, severe , no  psychosis ([place]) No date: Tardive dyskinesia 08/16/2015: Vitamin B12 deficiency  Past Surgical History: No date: CARDIAC SURGERY 09/27/2014: COLONOSCOPY     Comment:  at [facility] 01/14/2018: CORONARY ARTERY BYPASS GRAFT; N/A     Comment:  Procedure: CORONARY ARTERY BYPASS GRAFTING (CABG)X3,               RIGHT AND LEFT SAPHENOUS VEIN HARVEST, MAMMARY TAKE DOWN.              MAMMARY TO LAD, SVG TO PD,  SVG TO DISTAL CIRC.;  Surgeon:              Grace Isaac, MD;  Location: [place];  Service: Open               Heart Surgery;  Laterality: N/A; 09/27/2014: ESOPHAGOGASTRODUODENOSCOPY     Comment:  at [facility], Dr Rolan Lipa. biospy neg for celiac,               neg for H pylori.  No date: EYE SURGERY No date: gailstones 02/01/2017: IR FLUORO GUIDE CV LINE RIGHT 03/06/2017: IR FLUORO GUIDE CV LINE RIGHT 03/25/2017: IR FLUORO GUIDE CV LINE RIGHT 01/24/2016: IR GENERIC HISTORICAL     Comment:  IR FLUORO GUIDE CV LINE RIGHT 01/24/2016 Darrell K Allred,              PA-C [facility] 01/24/2016: IR GENERIC HISTORICAL     Comment:  IR US GUIDE VASC ACCESS RIGHT 01/24/2016 Darrell K Allred,              PA-C [facility] 02/01/2017: IR US GUIDE VASC ACCESS RIGHT 03/06/2017: IR US GUIDE VASC ACCESS RIGHT 03/25/2017: IR US GUIDE VASC ACCESS RIGHT 01/20/2019: IR US GUIDE VASC ACCESS RIGHT 01/20/2019: IR VENIPUNCTURE 8YRS/OLDER BY MD 01/07/2018: LEFT HEART CATH AND CORONARY ANGIOGRAPHY; N/A     Comment:  Procedure: LEFT HEART CATH AND CORONARY ANGIOGRAPHY;  Surgeon: Troy Sine, MD;  Location: [place] CV               LAB;  Service: Cardiovascular;  Laterality: N/A; 09/28/2002: POSTERIOR VITRECTOMY AND MEMBRANE PEEL-LEFT EYE 03/16/2002: POSTERIOR VITRECTOMY AND MEMBRANE PEEL-RIGHT EYE No date: RETINAL DETACHMENT SURGERY 01/14/2018: TEE WITHOUT CARDIOVERSION; N/A     Comment:  Procedure: TRANSESOPHAGEAL ECHOCARDIOGRAM (TEE);                Surgeon: Grace Isaac, MD;  Location: [place];                Service: Open Heart Surgery;  Laterality: N/A;  BMI    Body Mass Index: 30.61 kg/m      Reproductive/Obstetrics negative OB ROS                           Anesthesia Physical Anesthesia Plan  ASA: III  Anesthesia Plan: Spinal   Post-op Pain Management:    Induction:   PONV Risk Score and Plan:   Airway Management Planned: Natural Airway and  Simple Face Mask  Additional Equipment:   Intra-op Plan:   Post-operative Plan:   Informed Consent: I have reviewed the patients History and Physical, chart, labs and discussed the procedure including the risks, benefits and alternatives for the proposed anesthesia with the patient or authorized representative who has indicated his/her understanding and acceptance.     Dental Advisory Given  Plan Discussed with: Anesthesiologist, CRNA and Surgeon  Anesthesia Plan Comments:         Anesthesia Quick Evaluation

## 2019-05-10 NOTE — Consult Note (Signed)
ORTHOPAEDIC CONSULTATION  REQUESTING PHYSICIAN: Caren Griffins, MD  Chief Complaint: right hip pain  HPI: [name] is a 53 y.o. female who complains of right hip pain after mechanical fall. Please see H&P and ED notes for details. Denies any numbness, tingling or constitutional symptoms.  Past Medical History:  Diagnosis Date  . AKI (acute kidney injury) ([place])   . Anemia, iron deficiency   . Anxiety and depression 05/18/2015  . CAD (coronary artery disease), native coronary artery 01/11/2018   s/p CABG - Non-STEMI with severe three-vessel disease noted July 2019  . Depression   . Diabetic gastroparesis ([place])   . Diabetic neuropathy, type I diabetes mellitus ([place]) 05/18/2015  . DKA, type 1 ([place]) 03/18/2016  . Essential hypertension   . Gastroparesis   . GERD (gastroesophageal reflux disease)   . HLD (hyperlipidemia)   . MDD (major depressive disorder), single episode, severe , no psychosis ([place])   . Tardive dyskinesia   . Vitamin B12 deficiency 08/16/2015   Past Surgical History:  Procedure Laterality Date  . CARDIAC SURGERY    . COLONOSCOPY  09/27/2014   at [facility]  . CORONARY ARTERY BYPASS GRAFT N/A 01/14/2018   Procedure: CORONARY ARTERY BYPASS GRAFTING (CABG)X3, RIGHT AND LEFT SAPHENOUS VEIN HARVEST, MAMMARY TAKE DOWN. MAMMARY TO LAD, SVG TO PD, SVG TO DISTAL CIRC.;  Surgeon: Grace Isaac, MD;  Location: [place];  Service: Open Heart Surgery;  Laterality: N/A;  . ESOPHAGOGASTRODUODENOSCOPY  09/27/2014   at [facility], Dr Rolan Lipa. biospy neg for celiac, neg for H pylori.   . EYE SURGERY    . gailstones    . IR FLUORO GUIDE CV LINE RIGHT  02/01/2017  . IR FLUORO GUIDE CV LINE RIGHT  03/06/2017  . IR FLUORO GUIDE CV LINE RIGHT  03/25/2017  . IR GENERIC HISTORICAL  01/24/2016   IR FLUORO GUIDE CV LINE RIGHT 01/24/2016 Darrell K Allred, PA-C [facility]  . IR GENERIC HISTORICAL  01/24/2016   IR US GUIDE VASC ACCESS RIGHT 01/24/2016 Darrell K Allred, PA-C [facility]  .  IR US GUIDE VASC ACCESS RIGHT  02/01/2017  . IR US GUIDE VASC ACCESS RIGHT  03/06/2017  . IR US GUIDE VASC ACCESS RIGHT  03/25/2017  . IR US GUIDE VASC ACCESS RIGHT  01/20/2019  . IR VENIPUNCTURE 52YRS/OLDER BY MD  01/20/2019  . LEFT HEART CATH AND CORONARY ANGIOGRAPHY N/A 01/07/2018   Procedure: LEFT HEART CATH AND CORONARY ANGIOGRAPHY;  Surgeon: Troy Sine, MD;  Location: [place] [facility];  Service: Cardiovascular;  Laterality: N/A;  . POSTERIOR VITRECTOMY AND MEMBRANE PEEL-LEFT EYE  09/28/2002  . POSTERIOR VITRECTOMY AND MEMBRANE PEEL-RIGHT EYE  03/16/2002  . RETINAL DETACHMENT SURGERY    . TEE WITHOUT CARDIOVERSION N/A 01/14/2018   Procedure: TRANSESOPHAGEAL ECHOCARDIOGRAM (TEE);  Surgeon: Grace Isaac, MD;  Location: [place];  Service: Open Heart Surgery;  Laterality: N/A;   Social History   Socioeconomic History  . Marital status: Single    Spouse name: Not on file  . Number of children: 1  . Years of education: Not on file  . Highest education level: Not on file  Occupational History  . Occupation: Unemployed  Social Needs  . Financial resource strain: Not on file  . Food insecurity    Worry: Not on file    Inability: Not on file  . Transportation needs    Medical: Not on file    Non-medical: Not on file  Tobacco Use  .  Smoking status: Never Smoker  . Smokeless tobacco: Never Used  Substance and Sexual Activity  . Alcohol use: No  . Drug use: Yes    Types: Marijuana    Comment: occ  . Sexual activity: Yes    Partners: Male    Birth control/protection: None  Lifestyle  . Physical activity    Days per week: Not on file    Minutes per session: Not on file  . Stress: Not on file  Relationships  . Social Herbalist on phone: Not on file    Gets together: Not on file    Attends religious service: Not on file    Active member of club or organization: Not on file    Attends meetings of clubs or organizations: Not on file    Relationship status: Not on  file  Other Topics Concern  . Not on file  Social History Narrative  . Not on file   Family History  Problem Relation Age of Onset  . Cystic fibrosis Mother   . Hypertension Father   . Diabetes Brother   . Hypertension Maternal Grandmother    Allergies  Allergen Reactions  . Anesthetics, Amide Nausea And Vomiting  . Chlorhexidine   . Penicillins Diarrhea, Nausea And Vomiting and Other (See Comments)    Has patient had a PCN reaction causing immediate rash, facial/tongue/throat swelling, SOB or lightheadedness with hypotension: Yes Has patient had a PCN reaction causing severe rash involving mucus membranes or skin necrosis: No Has patient had a PCN reaction that required hospitalization No Has patient had a PCN reaction occurring within the last 10 years: Yes  If all of the above answers are "NO", then may proceed with Cephalosporin use.   . Buprenorphine Hcl Rash  . Encainide Nausea And Vomiting  . Metoclopramide Other (See Comments)    Dystonia, muscle rigidity.  Patient states she is only allergic to the "pill form, not the IV"   Prior to Admission medications   Medication Sig Start Date End Date Taking? Authorizing Provider  ammonium lactate (AMLACTIN) 12 % cream Apply topically as needed for dry skin. 07/08/18   Trula Slade, DPM  aspirin EC 81 MG tablet Take 1 tablet (81 mg total) by mouth daily. 06/03/18   Nahser, Wonda Cheng, MD  Blood Glucose Monitoring Suppl (ONETOUCH VERIO) w/Device KIT Use as directed 03/21/18   [name], MD  busPIRone (BUSPAR) 10 MG tablet Take 1 tablet (10 mg total) by mouth 3 (three) times daily. 06/12/18   [name], MD  docusate sodium (COLACE) 100 MG capsule Take 1 capsule (100 mg total) by mouth daily as needed for mild constipation. 02/03/18   Burtis Junes, NP  glucose blood (ACCU-CHEK AVIVA PLUS) test strip Use as instructed to test blood sugar 4 times daily Dx E10.65 04/16/19   Elayne Snare, MD  insulin aspart (NOVOLOG) 100  UNIT/ML injection Inject 10 units under the skin in the morning, and 8-10 units at supper. 04/16/19   Elayne Snare, MD  Insulin Glargine (LANTUS SOLOSTAR) 100 UNIT/ML Solostar Pen Inject 8 units under the skin in the morning and at night. 04/16/19   Elayne Snare, MD  Insulin Syringe-Needle U-100 (BD INSULIN SYRINGE ULTRAFINE) 31G X 5/16" 0.5 ML MISC 1 each by Does not apply route 4 (four) times daily. 04/28/18   Elayne Snare, MD  Lancets [facility] DELICA PLUS CYELYH90B) Volin 1 Units by Does not apply route as directed. 03/21/18   [name], MD  loperamide (IMODIUM) 2 MG capsule Take 1 capsule (2 mg total) by mouth 3 (three) times daily as needed for diarrhea or loose stools. 12/25/18   Aline August, MD  losartan (COZAAR) 25 MG tablet Take 1 tablet (25 mg total) by mouth daily. 03/06/19   Nahser, Wonda Cheng, MD  metoprolol tartrate (LOPRESSOR) 25 MG tablet Take 0.5 tablets (12.5 mg total) by mouth 2 (two) times daily. 10/08/18   Nahser, Wonda Cheng, MD  ondansetron (ZOFRAN ODT) 4 MG disintegrating tablet Take 1 tablet (4 mg total) by mouth every 6 (six) hours as needed for nausea or vomiting. 03/02/19   Ward, Delice Bison, DO  pantoprazole (PROTONIX) 40 MG tablet Take 1 tablet (40 mg total) by mouth daily. 06/12/18   [name], MD  pregabalin (LYRICA) 150 MG capsule Take 1 capsule (150 mg total) by mouth 2 (two) times daily. 01/02/19   Elayne Snare, MD  promethazine (PHENERGAN) 25 MG tablet Take 1 tablet (25 mg total) by mouth every 6 (six) hours as needed for nausea or vomiting. 03/02/19   Ward, Delice Bison, DO  pyridostigmine (MESTINON) 60 MG tablet Take 60 mg by mouth 3 (three) times daily.    [provider]   Dg Chest 1 View  Result Date: 05/09/2019 CLINICAL DATA:  Recent fall with chest pain, initial encounter EXAM: CHEST  1 VIEW COMPARISON:  04/27/2019 FINDINGS: Cardiac shadow is stable. Postoperative changes are seen. Diffuse vascular congestion and interstitial edema is noted consistent with  early CHF. No pleural effusion is noted. No focal infiltrate is seen. No bony abnormality is noted. IMPRESSION: Changes consistent with early CHF. Electronically Signed   By: Inez Catalina M.D.   On: 05/09/2019 21:38   Dg Pelvis 1-2 Views  Result Date: 05/09/2019 CLINICAL DATA:  Fall.  Right leg pain EXAM: PELVIS - 1-2 VIEW COMPARISON:  None. FINDINGS: Fracture through the proximal right femoral shaft just below the intertrochanteric region. Mild varus angulation. No subluxation or dislocation. Hip joints and SI joints are symmetric. IMPRESSION: Fracture through the proximal right femoral shaft with varus angulation. Electronically Signed   By: Rolm Baptise M.D.   On: 05/09/2019 21:35   Dg Femur, Min 2 Views Right  Result Date: 05/09/2019 CLINICAL DATA:  Fall EXAM: RIGHT FEMUR 2 VIEWS COMPARISON:  Pelvis study today FINDINGS: Fracture through the proximal right femoral shaft just below the intertrochanteric region. Mild varus angulation. No subluxation or dislocation. Right hip joint and knee joint unremarkable. IMPRESSION: Fracture through the proximal right femoral shaft with varus angulation. Electronically Signed   By: Rolm Baptise M.D.   On: 05/09/2019 21:36    Positive ROS: All other systems have been reviewed and were otherwise negative with the exception of those mentioned in the HPI and as above.  Physical Exam: General: Alert, no acute distress Cardiovascular: No pedal edema Respiratory: No cyanosis, no use of accessory musculature GI: No organomegaly, abdomen is soft and non-tender Skin: No lesions in the area of chief complaint Neurologic: Sensation intact distally Psychiatric: Patient is competent for consent with normal mood and affect Lymphatic: No axillary or cervical lymphadenopathy  MUSCULOSKELETAL: right leg short, externally rotated. LLE: FROM, NVI. Compartments soft. Good cap refill. Motor and sensory intact distally.  Assessment: Right proximal femur fracture, closed,  displaced, reverse obliquity  Plan: Plan a long trochanteric femoral nail today.  The diagnosis, risks, benefits and alternatives to treatment are all discussed in detail with the patient and family. Risks include but are not limited to  bleeding, infection, deep vein thrombosis, pulmonary embolism, nerve or vascular injury, non-union, repeat operation, persistent pain, weakness, stiffness and death. She understands and is eager to proceed.     Lovell Sheehan, MD    05/10/2019 11:26 AM

## 2019-05-10 NOTE — Transfer of Care (Signed)
Immediate Anesthesia Transfer of Care Note  Patient: [name]  Procedure(s) Performed: INTRAMEDULLARY (IM) NAIL INTERTROCHANTRIC (Right )  Patient Location: PACU  Anesthesia Type:MAC and Spinal  Level of Consciousness: drowsy and patient cooperative  Airway & Oxygen Therapy: Patient Spontanous Breathing and Patient connected to nasal cannula oxygen  Post-op Assessment: Report given to RN and Post -op Vital signs reviewed and stable  Post vital signs: Reviewed and stable  Last Vitals:  Vitals Value Taken Time  BP 108/54 05/10/19 1429  Temp    Pulse 80 05/10/19 1430  Resp 13 05/10/19 1430  SpO2 100 % 05/10/19 1430  Vitals shown include unvalidated device data.  Last Pain:  Vitals:   05/10/19 0847  TempSrc:   PainSc: Asleep      Patients Stated Pain Goal: 2 (96/29/52 8413)  Complications: No apparent anesthesia complications

## 2019-05-11 ENCOUNTER — Inpatient Hospital Stay (HOSPITAL_COMMUNITY)
Admit: 2019-05-11 | Discharge: 2019-05-11 | Disposition: A | Discharge status: Home / Self Care | Payer: Medicare HMO | Attending: Orthopedic Surgery | Admitting: Orthopedic Surgery

## 2019-05-11 ENCOUNTER — Encounter: Payer: Self-pay | Admitting: Orthopedic Surgery

## 2019-05-11 DIAGNOSIS — I509 Heart failure, unspecified: Secondary | ICD-10-CM

## 2019-05-11 LAB — CBC
HCT: 29.9 % — ABNORMAL LOW (ref 36.0–46.0)
Hemoglobin: 9.2 g/dL — ABNORMAL LOW (ref 12.0–15.0)
MCH: 24.3 pg — ABNORMAL LOW (ref 26.0–34.0)
MCHC: 30.8 g/dL (ref 30.0–36.0)
MCV: 79.1 fL — ABNORMAL LOW (ref 80.0–100.0)
Platelets: 290 10*3/uL (ref 150–400)
RBC: 3.78 MIL/uL — ABNORMAL LOW (ref 3.87–5.11)
RDW: 20.8 % — ABNORMAL HIGH (ref 11.5–15.5)
WBC: 9.5 10*3/uL (ref 4.0–10.5)
nRBC: 0 % (ref 0.0–0.2)

## 2019-05-11 LAB — COMPREHENSIVE METABOLIC PANEL
ALT: 16 U/L (ref 0–44)
AST: 25 U/L (ref 15–41)
Albumin: 2.6 g/dL — ABNORMAL LOW (ref 3.5–5.0)
Alkaline Phosphatase: 74 U/L (ref 38–126)
Anion gap: 8 (ref 5–15)
BUN: 16 mg/dL (ref 6–20)
CO2: 23 mmol/L (ref 22–32)
Calcium: 8.1 mg/dL — ABNORMAL LOW (ref 8.9–10.3)
Chloride: 103 mmol/L (ref 98–111)
Creatinine, Ser: 1.2 mg/dL — ABNORMAL HIGH (ref 0.44–1.00)
GFR calc Af Amer: >60 mL/min (ref >60)
GFR calc non Af Amer: 52 mL/min — ABNORMAL LOW (ref >60)
Glucose, Bld: 290 mg/dL — ABNORMAL HIGH (ref 70–99)
Potassium: 5.5 mmol/L — ABNORMAL HIGH (ref 3.5–5.1)
Sodium: 134 mmol/L — ABNORMAL LOW (ref 135–145)
Total Bilirubin: 1.2 mg/dL (ref 0.3–1.2)
Total Protein: 5.8 g/dL — ABNORMAL LOW (ref 6.5–8.1)

## 2019-05-11 LAB — TYPE AND SCREEN
ABO/RH(D): O POS
Antibody Screen: NEGATIVE
Unit division: 0
Unit division: 0

## 2019-05-11 LAB — BPAM RBC
Blood Product Expiration Date: 202012102359
Blood Product Expiration Date: 202012112359
ISSUE DATE / TIME: 202011151403
ISSUE DATE / TIME: 202011151811
Unit Type and Rh: 5100
Unit Type and Rh: 5100

## 2019-05-11 LAB — ECHOCARDIOGRAM COMPLETE
Height: 61 in
Weight: 2638.47 oz

## 2019-05-11 LAB — GLUCOSE, CAPILLARY
Glucose-Capillary: 149 mg/dL — ABNORMAL HIGH (ref 70–99)
Glucose-Capillary: 274 mg/dL — ABNORMAL HIGH (ref 70–99)
Glucose-Capillary: 318 mg/dL — ABNORMAL HIGH (ref 70–99)
Glucose-Capillary: 169 mg/dL — ABNORMAL HIGH (ref 70–99)
Glucose-Capillary: 113 mg/dL — ABNORMAL HIGH (ref 70–99)
Glucose-Capillary: 145 mg/dL — ABNORMAL HIGH (ref 70–99)

## 2019-05-11 MED ORDER — INSULIN GLARGINE 100 UNIT/ML ~~LOC~~ SOLN
8.0000 [IU] | Freq: Two times a day (BID) | SUBCUTANEOUS | Status: DC
  Administered 2019-05-11 (×2): 8 [IU] via SUBCUTANEOUS
  Filled 2019-05-11 (×4): qty 0.08

## 2019-05-11 MED ORDER — FUROSEMIDE 10 MG/ML IJ SOLN
40.0000 mg | Freq: Once | INTRAMUSCULAR | Status: AC
  Administered 2019-05-11: 40 mg via INTRAVENOUS
  Filled 2019-05-11: qty 4

## 2019-05-11 NOTE — Progress Notes (Signed)
PROGRESS NOTE  [name] [id number] DOB: 08-09-1965 DOA: 05/09/2019 PCP: [name], MD   LOS: 2 days   Brief Narrative / Interim history: 53 year old female with type 1 diabetes mellitus since age 67, coronary artery disease with CABG in 2019, neuropathy/gastroparesis, hypertension, depression, anxiety who came into the ED after right hip pain following a fall.  X-ray in the ED showed a fracture to the proximal right femoral shaft with varus angulation.  Chest x-ray did show some changes of venous congestion without focal consolidation or pleural effusion.  Orthopedic surgery was consulted, as well as cardiology given fluid overload  Subjective / 24h Interval events: Complains of anxiety this morning, also soreness in her hip.  Denies any chest pain, denies any shortness of breath.  Assessment & Plan: Principal Problem Closed fracture of the right proximal femur -After mechanical fall, no syncopal episodes.  Orthopedic surgery consulted and evaluated patient, she is status post intertrochanteric IM nail repair on 05/10/2019 by Dr. Kurtis Bushman -2D echo and cardiology consult were requested by admitting physician, will follow results/recommendations.  Echo pending -She was given IV Lasix x1 on admission, fluid status better but still overloaded, will repeat Lasix today  Active Problems Coronary artery disease status post CABG in 2019 -This is chronic, she appears stable from this standpoint.  EKG is without acute ischemic changes -Continue Lopressor -May need statin at discharge  Fluid overload/pulmonary venous congestion -Respiratory status seems to be at baseline however she does have a degree of fluid overload.  Await cardiology input but eventually will need some form of diuresis  -2D echo pending, repeat Lasix today  Type 1 diabetes mellitus with history of gastroparesis -Continue home Lantus, sliding scale.  Fasting this morning elevated, increase Lantus to 8 3 times  daily, continue sliding scale  CBG (last 3)  Recent Labs    05/10/19 1655 05/10/19 2238 05/11/19 0337  GLUCAP 167* 286* 274*    Iron deficiency anemia -Hemoglobin stable, 9.2 this morning.  Closely monitor  Leukocytosis -Likely reactive, monitor  Hypertension -Resume home Lopressor, hold losartan preop  Anxiety/depression -Continue home medications  Scheduled Meds: . busPIRone  10 mg Oral TID  . docusate sodium  100 mg Oral BID  . enoxaparin (LOVENOX) injection  40 mg Subcutaneous Q24H  . insulin aspart  0-15 Units Subcutaneous TID WC  . insulin aspart  0-5 Units Subcutaneous QHS  . insulin glargine  8 Units Subcutaneous BID  . metoprolol tartrate  12.5 mg Oral BID  . pregabalin  150 mg Oral BID  . tranexamic acid (CYKLOKAPRON) topical -INTRAOP  2,000 mg Topical Once   Continuous Infusions: . lactated ringers 75 mL/hr at 05/10/19 2311   PRN Meds:.HYDROcodone-acetaminophen, LORazepam, morphine injection, ondansetron (ZOFRAN) IV, ondansetron **OR** ondansetron (ZOFRAN) IV, prochlorperazine, senna-docusate  DVT prophylaxis: SCDs Code Status: Full code Family Communication: Discussed with patient Disposition Plan: To be determined  Consultants:  Orthopedic surgery Cardiology  Procedures:  2D echo: Pending  Microbiology  SARS-CoV-2 11/14-pending SARS-CoV-2 11/15-pending MRSA PCR-negative  Antimicrobials: None   Objective: Vitals:   05/10/19 1950 05/10/19 2124 05/11/19 0500 05/11/19 0504  BP:  (!) 124/46  (!) 127/48  Pulse:  93  94  Resp:  20    Temp:  98.8 F (37.1 C)  99.9 F (37.7 C)  TempSrc:  Oral  Oral  SpO2: 100% 99%  99%  Weight:   74.8 kg   Height:        Intake/Output Summary (Last 24 hours) at 05/11/2019 [phone]  Last data filed at 05/11/2019 [id number] Gross per 24 hour  Intake 1818 ml  Output 1500 ml  Net 318 ml   Filed Weights   05/09/19 2017 05/11/19 0500  Weight: 73.5 kg 74.8 kg    Examination:  Constitutional: No distress,  eating breakfast Eyes: No scleral icterus ENMT: Moist mucous membranes Neck: normal, supple Respiratory: Diminished at the bases but overall clear, no wheezing, no crackles Cardiovascular: Regular rate and rhythm, no murmurs, 1+ pitting edema Abdomen: Nontender, nondistended, positive bowel sounds Musculoskeletal: no clubbing / cyanosis.  Skin: No rashes seen Neurologic: Equal strength, no focal deficits Psychiatric: Normal judgment and insight. Alert and oriented x 3. Normal mood.    Data Reviewed: I have independently reviewed following labs and imaging studies   CBC: Recent Labs  Lab 05/09/19 2317 05/10/19 0034 05/10/19 2207 05/11/19 0521  WBC 15.9* 14.6* 11.9* 9.5  NEUTROABS 13.6*  --   --   --   HGB 8.6* 8.5* 9.8* 9.2*  HCT 29.8* 29.8* 31.6* 29.9*  MCV 77.8* 79.0* 79.2* 79.1*  PLT 402* 402* 326 [id number]   Basic Metabolic Panel: Recent Labs  Lab 05/09/19 2317 05/10/19 0034 05/10/19 2207 05/11/19 0521  NA 137 137  --  134*  K 4.1 4.3  --  5.5*  CL 103 104  --  103  CO2 25 25  --  23  GLUCOSE 122* 153*  --  290*  BUN 18 18  --  16  CREATININE 1.18* 1.25* 1.17* 1.20*  CALCIUM 8.8* 8.8*  --  8.1*   Liver Function Tests: Recent Labs  Lab 05/11/19 0521  AST 25  ALT 16  ALKPHOS 74  BILITOT 1.2  PROT 5.8*  ALBUMIN 2.6*   Coagulation Profile: No results for input(s): INR, PROTIME in the last 168 hours. HbA1C: No results for input(s): HGBA1C in the last 72 hours. CBG: Recent Labs  Lab 05/10/19 0742 05/10/19 1432 05/10/19 1655 05/10/19 2238 05/11/19 0337  GLUCAP 285* 127* 167* 286* 274*    Recent Results (from the past 240 hour(s))  SARS CORONAVIRUS 2 (TAT 6-24 HRS) Nasopharyngeal Nasopharyngeal Swab     Status: None   Collection Time: 05/09/19 11:18 PM   Specimen: Nasopharyngeal Swab  Result Value Ref Range Status   SARS Coronavirus 2 NEGATIVE NEGATIVE Final    Comment: (NOTE) SARS-CoV-2 target nucleic acids are NOT DETECTED. The SARS-CoV-2 RNA is  generally detectable in upper and lower respiratory specimens during the acute phase of infection. Negative results do not preclude SARS-CoV-2 infection, do not rule out co-infections with other pathogens, and should not be used as the sole basis for treatment or other patient management decisions. Negative results must be combined with clinical observations, patient history, and epidemiological information. The expected result is Negative. Fact Sheet for Patients: SugarRoll.be Fact Sheet for Healthcare Providers: https://www.woods-mathews.com/ This test is not yet approved or cleared by the [place] FDA and  has been authorized for detection and/or diagnosis of SARS-CoV-2 by FDA under an Emergency Use Authorization (EUA). This EUA will remain  in effect (meaning this test can be used) for the duration of the COVID-19 declaration under Section 56 4(b)(1) of the Act, 21 U.S.C. section 360bbb-3(b)(1), unless the authorization is terminated or revoked sooner. Performed at [facility], [place] [address], [place], [place] [address]   Surgical pcr screen     Status: None   Collection Time: 05/10/19 12:41 AM   Specimen: Nasal Mucosa; Nasal Swab  Result Value Ref Range  Status   MRSA, PCR NEGATIVE NEGATIVE Final   Staphylococcus aureus NEGATIVE NEGATIVE Final    Comment: (NOTE) The Xpert SA Assay (FDA approved for NASAL specimens in patients 91 years of age and older), is one component of a comprehensive surveillance program. It is not intended to diagnose infection nor to guide or monitor treatment. Performed at [facility], [address] [place], [place] [address]   SARS Coronavirus 2 by RT PCR (hospital order, performed in [facility] [place] hospital lab) Nasopharyngeal Nasopharyngeal Swab     Status: None   Collection Time: 05/10/19 10:02 AM   Specimen: Nasopharyngeal Swab  Result Value Ref Range Status   SARS Coronavirus 2  NEGATIVE NEGATIVE Final    Comment: (NOTE) If result is NEGATIVE SARS-CoV-2 target nucleic acids are NOT DETECTED. The SARS-CoV-2 RNA is generally detectable in upper and lower  respiratory specimens during the acute phase of infection. The lowest  concentration of SARS-CoV-2 viral copies this assay can detect is 250  copies / mL. A negative result does not preclude SARS-CoV-2 infection  and should not be used as the sole basis for treatment or other  patient management decisions.  A negative result may occur with  improper specimen collection / handling, submission of specimen other  than nasopharyngeal swab, presence of viral mutation(s) within the  areas targeted by this assay, and inadequate number of viral copies  (<250 copies / mL). A negative result must be combined with clinical  observations, patient history, and epidemiological information. If result is POSITIVE SARS-CoV-2 target nucleic acids are DETECTED. The SARS-CoV-2 RNA is generally detectable in upper and lower  respiratory specimens dur ing the acute phase of infection.  Positive  results are indicative of active infection with SARS-CoV-2.  Clinical  correlation with patient history and other diagnostic information is  necessary to determine patient infection status.  Positive results do  not rule out bacterial infection or co-infection with other viruses. If result is PRESUMPTIVE POSTIVE SARS-CoV-2 nucleic acids MAY BE PRESENT.   A presumptive positive result was obtained on the submitted specimen  and confirmed on repeat testing.  While 2019 novel coronavirus  (SARS-CoV-2) nucleic acids may be present in the submitted sample  additional confirmatory testing may be necessary for epidemiological  and / or clinical management purposes  to differentiate between  SARS-CoV-2 and other Sarbecovirus currently known to infect humans.  If clinically indicated additional testing with an alternate test  methodology [phone])  is advised. The SARS-CoV-2 RNA is generally  detectable in upper and lower respiratory sp ecimens during the acute  phase of infection. The expected result is Negative. Fact Sheet for Patients:  StrictlyIdeas.no Fact Sheet for Healthcare Providers: BankingDealers.co.za This test is not yet approved or cleared by the [place] FDA and has been authorized for detection and/or diagnosis of SARS-CoV-2 by FDA under an Emergency Use Authorization (EUA).  This EUA will remain in effect (meaning this test can be used) for the duration of the COVID-19 declaration under Section 564(b)(1) of the Act, 21 U.S.C. section 360bbb-3(b)(1), unless the authorization is terminated or revoked sooner. Performed at [facility], [address], [place], [place] [address]      Radiology Studies: Dg C-arm 1-60 Min  Result Date: 05/10/2019 CLINICAL DATA:  IM nail right femur EXAM: OPERATIVE right HIP (WITH PELVIS IF PERFORMED) 15 VIEWS TECHNIQUE: Fluoroscopic spot image(s) were submitted for interpretation post-operatively. COMPARISON:  05/09/2019 FINDINGS: Fifteen low resolution intraoperative spot views of the right hip  and femur. Total fluoroscopy time was 1 minutes 8 seconds. The images demonstrate a right subtrochanteric fracture. Subsequent intramedullary rod and screw fixation of the right femur. IMPRESSION: Intraoperative fluoroscopic assistance provided during surgical fixation of right proximal femur fracture Electronically Signed   By: Donavan Foil M.D.   On: 05/10/2019 18:12   Dg Hip Operative Unilat With Pelvis Right  Result Date: 05/10/2019 CLINICAL DATA:  IM nail right femur EXAM: OPERATIVE right HIP (WITH PELVIS IF PERFORMED) 15 VIEWS TECHNIQUE: Fluoroscopic spot image(s) were submitted for interpretation post-operatively. COMPARISON:  05/09/2019 FINDINGS: Fifteen low resolution intraoperative spot views of the right hip and femur. Total  fluoroscopy time was 1 minutes 8 seconds. The images demonstrate a right subtrochanteric fracture. Subsequent intramedullary rod and screw fixation of the right femur. IMPRESSION: Intraoperative fluoroscopic assistance provided during surgical fixation of right proximal femur fracture Electronically Signed   By: Donavan Foil M.D.   On: 05/10/2019 18:12    Marzetta Board, MD, PhD [facility]  Between 7 am - 7 pm I am available, please contact me via Amion or Securechat  Between 7 pm - 7 am I am not available, please contact night coverage MD/APP via Amion

## 2019-05-11 NOTE — Progress Notes (Signed)
PT Cancellation Note  Patient Details Name: [name] MRN: [id number] DOB: 30-Jun-1965   Cancelled Treatment:    Reason Eval/Treat Not Completed: Medical issues which prohibited therapy(Chart reviewed, pt's K+ is outside of safe range for PT at this time. Will continue to follow remotely and evaluate once medically ready.)  9:50 AM, 05/11/19 [name], PT, DPT Physical Therapist - [facility]  [phone] ([place])    [name] 05/11/2019, 9:50 AM

## 2019-05-11 NOTE — Plan of Care (Signed)
  Problem: Education: Goal: Verbalization of understanding the information provided (i.e., activity precautions, restrictions, etc) will improve Outcome: Progressing Goal: Individualized Educational Video(s) Outcome: Progressing   Problem: Activity: Goal: Ability to ambulate and perform ADLs will improve Outcome: Progressing   Problem: Clinical Measurements: Goal: Postoperative complications will be avoided or minimized Outcome: Progressing   Problem: Self-Concept: Goal: Ability to maintain and perform role responsibilities to the fullest extent possible will improve Outcome: Progressing   Problem: Pain Management: Goal: Pain level will decrease Outcome: Progressing   Problem: Education: Goal: Knowledge of the prescribed therapeutic regimen will improve Outcome: Progressing Goal: Understanding of discharge needs will improve Outcome: Progressing Goal: Individualized Educational Video(s) Outcome: Progressing   Problem: Activity: Goal: Ability to avoid complications of mobility impairment will improve Outcome: Progressing Goal: Ability to tolerate increased activity will improve Outcome: Progressing   Problem: Clinical Measurements: Goal: Postoperative complications will be avoided or minimized Outcome: Progressing

## 2019-05-11 NOTE — Progress Notes (Signed)
Subjective:  Patient reports pain as moderate to severe.  Patient very anxious this morning.  Objective:   VITALS:   Vitals:   05/10/19 1950 05/10/19 2124 05/11/19 0500 05/11/19 0504  BP:  (!) 124/46  (!) 127/48  Pulse:  93  94  Resp:  20    Temp:  98.8 F (37.1 C)  99.9 F (37.7 C)  TempSrc:  Oral  Oral  SpO2: 100% 99%  99%  Weight:   74.8 kg   Height:        PHYSICAL EXAM:  Neurologically intact ABD soft Neurovascular intact Sensation intact distally Intact pulses distally Dorsiflexion/Plantar flexion intact Incision: dressing C/D/I No cellulitis present Compartment soft  LABS  Results for orders placed or performed during the hospital encounter of 05/09/19 (from the past 24 hour(s))  Prepare RBC (crossmatch)     Status: None   Collection Time: 05/10/19  1:38 PM  Result Value Ref Range   Order Confirmation      ORDER PROCESSED BY BLOOD BANK Performed at [facility], [address] [place], [place] [address]   Glucose, capillary     Status: Abnormal   Collection Time: 05/10/19  2:32 PM  Result Value Ref Range   Glucose-Capillary 127 (H) 70 - 99 mg/dL  Prepare RBC     Status: None   Collection Time: 05/10/19  4:55 PM  Result Value Ref Range   Order Confirmation      DUPLICATE REQUEST OR COMPLETED ORDER AND NO FLOWSHEET AVAILABLE FOR TRANSFUSION OF LAST UNIT. HAD TO REORDER TO OBTAIN FLOWSHEET 05/10/2019 1657 SJL Performed at [facility], [address] [place], [place] [address]   Glucose, capillary     Status: Abnormal   Collection Time: 05/10/19  4:55 PM  Result Value Ref Range   Glucose-Capillary 167 (H) 70 - 99 mg/dL  CBC     Status: Abnormal   Collection Time: 05/10/19 10:07 PM  Result Value Ref Range   WBC 11.9 (H) 4.0 - 10.5 K/uL   RBC 3.99 3.87 - 5.11 MIL/uL   Hemoglobin 9.8 (L) 12.0 - 15.0 g/dL   HCT 31.6 (L) 36.0 - 46.0 %   MCV 79.2 (L) 80.0 - 100.0 fL   MCH 24.6 (L) 26.0 - 34.0 pg   MCHC 31.0 30.0 - 36.0 g/dL   RDW 20.4 (H) 11.5 - 15.5 %   Platelets 326 150 - 400 K/uL   nRBC 0.0 0.0 - 0.2 %  Creatinine, serum     Status: Abnormal   Collection Time: 05/10/19 10:07 PM  Result Value Ref Range   Creatinine, Ser 1.17 (H) 0.44 - 1.00 mg/dL   GFR calc non Af Amer 54 (L) >60 mL/min   GFR calc Af Amer >60 >60 mL/min  Glucose, capillary     Status: Abnormal   Collection Time: 05/10/19 10:38 PM  Result Value Ref Range   Glucose-Capillary 286 (H) 70 - 99 mg/dL  Glucose, capillary     Status: Abnormal   Collection Time: 05/11/19  3:37 AM  Result Value Ref Range   Glucose-Capillary 274 (H) 70 - 99 mg/dL  Comprehensive metabolic panel tomorrow     Status: Abnormal   Collection Time: 05/11/19  5:21 AM  Result Value Ref Range   Sodium 134 (L) 135 - 145 mmol/L   Potassium 5.5 (H) 3.5 - 5.1 mmol/L   Chloride 103 98 - 111 mmol/L   CO2 23 22 - 32 mmol/L   Glucose, Bld 290 (H)  70 - 99 mg/dL   BUN 16 6 - 20 mg/dL   Creatinine, Ser 1.20 (H) 0.44 - 1.00 mg/dL   Calcium 8.1 (L) 8.9 - 10.3 mg/dL   Total Protein 5.8 (L) 6.5 - 8.1 g/dL   Albumin 2.6 (L) 3.5 - 5.0 g/dL   AST 25 15 - 41 U/L   ALT 16 0 - 44 U/L   Alkaline Phosphatase 74 38 - 126 U/L   Total Bilirubin 1.2 0.3 - 1.2 mg/dL   GFR calc non Af Amer 52 (L) >60 mL/min   GFR calc Af Amer >60 >60 mL/min   Anion gap 8 5 - 15  CBC Tomorrow     Status: Abnormal   Collection Time: 05/11/19  5:21 AM  Result Value Ref Range   WBC 9.5 4.0 - 10.5 K/uL   RBC 3.78 (L) 3.87 - 5.11 MIL/uL   Hemoglobin 9.2 (L) 12.0 - 15.0 g/dL   HCT 29.9 (L) 36.0 - 46.0 %   MCV 79.1 (L) 80.0 - 100.0 fL   MCH 24.3 (L) 26.0 - 34.0 pg   MCHC 30.8 30.0 - 36.0 g/dL   RDW 20.8 (H) 11.5 - 15.5 %   Platelets 290 150 - 400 K/uL   nRBC 0.0 0.0 - 0.2 %    Dg Chest 1 View  Result Date: 05/09/2019 CLINICAL DATA:  Recent fall with chest pain, initial encounter EXAM: CHEST  1 VIEW COMPARISON:  04/27/2019 FINDINGS: Cardiac shadow is stable. Postoperative changes are seen. Diffuse vascular  congestion and interstitial edema is noted consistent with early CHF. No pleural effusion is noted. No focal infiltrate is seen. No bony abnormality is noted. IMPRESSION: Changes consistent with early CHF. Electronically Signed   By: Inez Catalina M.D.   On: 05/09/2019 21:38   Dg Pelvis 1-2 Views  Result Date: 05/09/2019 CLINICAL DATA:  Fall.  Right leg pain EXAM: PELVIS - 1-2 VIEW COMPARISON:  None. FINDINGS: Fracture through the proximal right femoral shaft just below the intertrochanteric region. Mild varus angulation. No subluxation or dislocation. Hip joints and SI joints are symmetric. IMPRESSION: Fracture through the proximal right femoral shaft with varus angulation. Electronically Signed   By: Rolm Baptise M.D.   On: 05/09/2019 21:35   Dg C-arm 1-60 Min  Result Date: 05/10/2019 CLINICAL DATA:  IM nail right femur EXAM: OPERATIVE right HIP (WITH PELVIS IF PERFORMED) 15 VIEWS TECHNIQUE: Fluoroscopic spot image(s) were submitted for interpretation post-operatively. COMPARISON:  05/09/2019 FINDINGS: Fifteen low resolution intraoperative spot views of the right hip and femur. Total fluoroscopy time was 1 minutes 8 seconds. The images demonstrate a right subtrochanteric fracture. Subsequent intramedullary rod and screw fixation of the right femur. IMPRESSION: Intraoperative fluoroscopic assistance provided during surgical fixation of right proximal femur fracture Electronically Signed   By: Donavan Foil M.D.   On: 05/10/2019 18:12   Dg Hip Operative Unilat With Pelvis Right  Result Date: 05/10/2019 CLINICAL DATA:  IM nail right femur EXAM: OPERATIVE right HIP (WITH PELVIS IF PERFORMED) 15 VIEWS TECHNIQUE: Fluoroscopic spot image(s) were submitted for interpretation post-operatively. COMPARISON:  05/09/2019 FINDINGS: Fifteen low resolution intraoperative spot views of the right hip and femur. Total fluoroscopy time was 1 minutes 8 seconds. The images demonstrate a right subtrochanteric fracture.  Subsequent intramedullary rod and screw fixation of the right femur. IMPRESSION: Intraoperative fluoroscopic assistance provided during surgical fixation of right proximal femur fracture Electronically Signed   By: Donavan Foil M.D.   On: 05/10/2019 18:12   Dg Femur,  Min 2 Views Right  Result Date: 05/09/2019 CLINICAL DATA:  Fall EXAM: RIGHT FEMUR 2 VIEWS COMPARISON:  Pelvis study today FINDINGS: Fracture through the proximal right femoral shaft just below the intertrochanteric region. Mild varus angulation. No subluxation or dislocation. Right hip joint and knee joint unremarkable. IMPRESSION: Fracture through the proximal right femoral shaft with varus angulation. Electronically Signed   By: Rolm Baptise M.D.   On: 05/09/2019 21:36    Assessment/Plan: 1 Day Post-Op   Principal Problem:   Closed fracture of proximal end of femur, right, initial encounter ([place]) Active Problems:   Anxiety and depression   Essential hypertension   Type 1 diabetes mellitus with hyperlipidemia ([place])   CAD (coronary artery disease), native coronary artery   Advance diet Up with therapy  Discharge per medicine Continue Lovenox X 14 days after surgery WBAT right lower extremity Follow up in Dr. Harlow Mares office Dec 1 call for appointment   Carlynn Spry , PA-C 05/11/2019, 12:24 PM

## 2019-05-11 NOTE — Anesthesia Postprocedure Evaluation (Signed)
Anesthesia Post Note  Patient: [name]  Procedure(s) Performed: INTRAMEDULLARY (IM) NAIL INTERTROCHANTRIC (Right )  Patient location during evaluation: Nursing Unit Anesthesia Type: Spinal Level of consciousness: oriented and awake and alert Pain management: pain level controlled Vital Signs Assessment: post-procedure vital signs reviewed and stable Respiratory status: spontaneous breathing and respiratory function stable Cardiovascular status: blood pressure returned to baseline and stable Postop Assessment: no headache, no backache, no apparent nausea or vomiting and patient able to bend at knees Anesthetic complications: no     Last Vitals:  Vitals:   05/10/19 2124 05/11/19 0504  BP: (!) 124/46 (!) 127/48  Pulse: 93 94  Resp: 20   Temp: 37.1 C 37.7 C  SpO2: 99% 99%    Last Pain:  Vitals:   05/11/19 0558  TempSrc:   PainSc: 6                  Brantley Fling

## 2019-05-11 NOTE — Progress Notes (Signed)
*  PRELIMINARY RESULTS* Echocardiogram 2D Echocardiogram has been performed.  [name] 05/11/2019, 10:40 AM

## 2019-05-11 NOTE — Care Management Note (Signed)
Case Management Note  Patient Details  Name: [name] MRN: [id number] Date of Birth: June 13, 1966  Subjective/Objective:  53 yo sustained a R femur fx after a fall. She is s/p IM nail intertrochantric.            Action/Plan: D/C plan   Expected Discharge Date:                  Expected Discharge Plan:  (Unknown at this time; awaiting for PT evaluation)  In-House Referral:     Discharge planning Services  CM Consult  Post Acute Care Choice:    Choice offered to:     DME Arranged:    DME Agency:     HH Arranged:    HH Agency:     Status of Service:     If discussed at [employer], dates discussed:    Additional Comments: Met with pt at beside. She lives with her sister who works 12 hrs. She has a PCP. She has a cane. PT to evaluate pt and will f/u with their recommendations. Explained to pt rehab vs HH and that we will f/u with PT recommendations. Will continue to f/u to assist with the D/C plan.  Norina Buzzard, RN 05/11/2019, 3:49 PM

## 2019-05-11 NOTE — Progress Notes (Signed)
Inpatient Diabetes Program Recommendations  AACE/ADA: New Consensus Statement on Inpatient Glycemic Control   Target Ranges:  Prepandial:   less than 140 mg/dL      Peak postprandial:   less than 180 mg/dL (1-2 hours)      Critically ill patients:  140 - 180 mg/dL  Results for [name], [name] (MRN [id number]) as of 05/11/2019 09:35  Ref. Range 05/11/2019 05:21  Glucose Latest Ref Range: 70 - 99 mg/dL 290 (H)   Results for [name], [name] (MRN [id number]) as of 05/11/2019 09:35  Ref. Range 05/10/2019 07:42 05/10/2019 14:32 05/10/2019 16:55 05/10/2019 22:38 05/11/2019 03:37  Glucose-Capillary Latest Ref Range: 70 - 99 mg/dL 285 (H) 127 (H) 167 (H) 286 (H) 274 (H)    Review of Glycemic Control  Diabetes history: DM1 (makes NO insulin; requires basal, correction, and meal coverage insulin) Outpatient Diabetes medications: Lantus 8 units BID, Novolog 10 units with breakfast, Novolog 8-10 units with supper Current orders for Inpatient glycemic control: Lantus 8 units QHS, Novolog 0-15 units TID with meals, Novolog 0-5 units QHS  Inpatient Diabetes Program Recommendations:   Insulin - Basal: Please consider increasing Lantus to 8 units BID (starting this morning at 10 am).  Correction (SSI): Please decrease Novolog correction to sensitive scale (patient has Type 1 DM and is sensitive to insulin).  Insulin - Meal Coverage: Please consider ordering Novolog 3 units TID with meals for meal coverage if patient eats at least 50% of meals.  NOTE: In reviewing chart, noted patient has Type 1 DM, sees Dr. Dwyane Dee (Endocrinologist) for DM management, and was last seen on 04/15/19.   Thanks, Barnie Alderman, RN, MSN, CDE Diabetes Coordinator Inpatient Diabetes Program [phone] (Team Pager from 8am to 5pm)

## 2019-05-12 ENCOUNTER — Other Ambulatory Visit: Payer: Self-pay

## 2019-05-12 LAB — BASIC METABOLIC PANEL
Anion gap: 8 (ref 5–15)
BUN: 18 mg/dL (ref 6–20)
CO2: 22 mmol/L (ref 22–32)
Calcium: 8.2 mg/dL — ABNORMAL LOW (ref 8.9–10.3)
Chloride: 101 mmol/L (ref 98–111)
Creatinine, Ser: 1.39 mg/dL — ABNORMAL HIGH (ref 0.44–1.00)
GFR calc Af Amer: 51 mL/min — ABNORMAL LOW (ref >60)
GFR calc non Af Amer: 44 mL/min — ABNORMAL LOW (ref >60)
Glucose, Bld: 280 mg/dL — ABNORMAL HIGH (ref 70–99)
Potassium: 5.4 mmol/L — ABNORMAL HIGH (ref 3.5–5.1)
Sodium: 131 mmol/L — ABNORMAL LOW (ref 135–145)

## 2019-05-12 LAB — GLUCOSE, CAPILLARY
Glucose-Capillary: 339 mg/dL — ABNORMAL HIGH (ref 70–99)
Glucose-Capillary: 414 mg/dL — ABNORMAL HIGH (ref 70–99)
Glucose-Capillary: 331 mg/dL — ABNORMAL HIGH (ref 70–99)

## 2019-05-12 LAB — CBC
HCT: 29.8 % — ABNORMAL LOW (ref 36.0–46.0)
Hemoglobin: 9.5 g/dL — ABNORMAL LOW (ref 12.0–15.0)
MCH: 25 pg — ABNORMAL LOW (ref 26.0–34.0)
MCHC: 31.9 g/dL (ref 30.0–36.0)
MCV: 78.4 fL — ABNORMAL LOW (ref 80.0–100.0)
Platelets: 308 10*3/uL (ref 150–400)
RBC: 3.8 MIL/uL — ABNORMAL LOW (ref 3.87–5.11)
RDW: 21.9 % — ABNORMAL HIGH (ref 11.5–15.5)
WBC: 14.1 10*3/uL — ABNORMAL HIGH (ref 4.0–10.5)
nRBC: 0 % (ref 0.0–0.2)

## 2019-05-12 MED ORDER — INSULIN ASPART 100 UNIT/ML ~~LOC~~ SOLN
4.0000 [IU] | Freq: Three times a day (TID) | SUBCUTANEOUS | Status: AC
  Administered 2019-05-12 (×2): 4 [IU] via SUBCUTANEOUS
  Filled 2019-05-12 (×2): qty 1

## 2019-05-12 MED ORDER — INSULIN GLARGINE 100 UNIT/ML ~~LOC~~ SOLN
12.0000 [IU] | Freq: Two times a day (BID) | SUBCUTANEOUS | Status: DC
  Filled 2019-05-12: qty 0.12

## 2019-05-12 MED ORDER — INSULIN ASPART 100 UNIT/ML ~~LOC~~ SOLN
19.0000 [IU] | Freq: Once | SUBCUTANEOUS | Status: AC
  Administered 2019-05-12: 19 [IU] via SUBCUTANEOUS
  Filled 2019-05-12: qty 1

## 2019-05-12 MED ORDER — INSULIN ASPART 100 UNIT/ML ~~LOC~~ SOLN
0.0000 [IU] | Freq: Three times a day (TID) | SUBCUTANEOUS | Status: DC
  Administered 2019-05-12: 3 [IU] via SUBCUTANEOUS
  Administered 2019-05-12: 15 [IU] via SUBCUTANEOUS
  Administered 2019-05-13: 4 [IU] via SUBCUTANEOUS
  Administered 2019-05-13: 11 [IU] via SUBCUTANEOUS
  Administered 2019-05-13 – 2019-05-14 (×2): 4 [IU] via SUBCUTANEOUS
  Administered 2019-05-14: 7 [IU] via SUBCUTANEOUS
  Administered 2019-05-14: 4 [IU] via SUBCUTANEOUS
  Administered 2019-05-15 (×2): 7 [IU] via SUBCUTANEOUS
  Filled 2019-05-12 (×11): qty 1

## 2019-05-12 MED ORDER — INSULIN ASPART 100 UNIT/ML ~~LOC~~ SOLN: 4.0000 [IU] | Freq: Three times a day (TID) | SUBCUTANEOUS | Status: DC

## 2019-05-12 MED ORDER — INSULIN GLARGINE 100 UNIT/ML ~~LOC~~ SOLN
12.0000 [IU] | Freq: Two times a day (BID) | SUBCUTANEOUS | Status: DC
  Administered 2019-05-12 – 2019-05-15 (×7): 12 [IU] via SUBCUTANEOUS
  Filled 2019-05-12 (×9): qty 0.12

## 2019-05-12 MED ORDER — INSULIN ASPART 100 UNIT/ML ~~LOC~~ SOLN
0.0000 [IU] | Freq: Every day | SUBCUTANEOUS | Status: DC
  Filled 2019-05-12: qty 1

## 2019-05-12 MED ORDER — SODIUM ZIRCONIUM CYCLOSILICATE 10 G PO PACK
10.0000 g | PACK | Freq: Once | ORAL | Status: AC
  Administered 2019-05-12: 10 g via ORAL
  Filled 2019-05-12: qty 1

## 2019-05-12 NOTE — Progress Notes (Addendum)
Inpatient Diabetes Program Recommendations  AACE/ADA: New Consensus Statement on Inpatient Glycemic Control   Target Ranges:  Prepandial:   less than 140 mg/dL      Peak postprandial:   less than 180 mg/dL (1-2 hours)      Critically ill patients:  140 - 180 mg/dL   Results for [name], [name] (MRN [id number]) as of 05/12/2019 11:08  Ref. Range 05/11/2019 07:56 05/11/2019 12:07 05/11/2019 16:51 05/11/2019 21:17 05/12/2019 08:20 05/12/2019 09:47  Glucose-Capillary Latest Ref Range: 70 - 99 mg/dL 318 (H)  Novolog 11 units  Lantus 8 units 169 (H)  Novolog 3 units 113 (H) 145 (H)     Lantus 8 units 339 (H) 414 (H)  Novolog 19 units   Review of Glycemic Control Diabetes history: DM1 (makes NO insulin; requires basal, correction, and meal coverage insulin) Outpatient Diabetes medications: Lantus 8 units BID, Novolog 10 units with breakfast, Novolog 8-10 units with supper Current orders for Inpatient glycemic control: Lantus 12 units BID, Novolog 0-20 units TID with meals, Novolog 0-5 units QHS, Novolog 4 units TID with meals for meal coverage  Inpatient Diabetes Program Recommendations:   Insulin: Noted Lantus increased to 12 units BID (was 8 units BID) to start at 10pm today, Novolog meal coverage added today, and Novolog correction scale increased. Will likely need to decrease Novolog correction scale as patient has Type 1 and is sensitive to insulin.   Diet: Added Carb Modified to current heart healthy diet.  NOTE: Noted fasting glucose 339 mg/dl this morning and up to 414 mg/dl at 9:47 am after breakfast. Noted Novolog 19 units given at 10:15 today. Several changes have already been made by MD with insulin (Lantus increased, Novolog meal coverage added, and Novolog correction scale increased to resistant scale).  Patient has not received morning dose of Lantus yet. Sent chat message to J. Landry Mellow, RN at 11:15 to ask that 10 am morning dose of Lantus be given. Will likely need to  decrease Novolog correction scale back down.  Thanks, Barnie Alderman, RN, MSN, CDE Diabetes Coordinator Inpatient Diabetes Program [phone] (Team Pager from 8am to 5pm)

## 2019-05-12 NOTE — Care Management Important Message (Signed)
Important Message  Patient Details  Name: [name] MRN: [id number] Date of Birth: 04-18-1966   Medicare Important Message Given:  Yes     [name] 05/12/2019, 2:44 PM

## 2019-05-12 NOTE — Progress Notes (Signed)
New order for 19 units once for elevated CBG.

## 2019-05-12 NOTE — Progress Notes (Signed)
PT Cancellation Note  Patient Details Name: [name] MRN: [id number] DOB: 1966/04/04   Cancelled Treatment:    Reason Eval/Treat Not Completed: Medical issues which prohibited therapy(K+:5.4 this date, remains outside of range for working with PT. Will contiue to follow remotely and initiate PT services once pt is medically ready.)  7:37 AM, 05/12/19 [name], PT, DPT Physical Therapist - [facility]  [phone] ([place])    Eniola Cerullo C 05/12/2019, 7:37 AM

## 2019-05-12 NOTE — Progress Notes (Signed)
PROGRESS NOTE  [name] [id number] DOB: [birth year] DOA: 05/09/2019 PCP: [name], MD   LOS: 3 days   Brief Narrative / Interim history: 53 year old female with type 1 diabetes mellitus since age 12, coronary artery disease with CABG in 2019, neuropathy/gastroparesis, hypertension, depression, anxiety who came into the ED after right hip pain following a fall.  X-ray in the ED showed a fracture to the proximal right femoral shaft with varus angulation.  Chest x-ray did show some changes of venous congestion without focal consolidation or pleural effusion.  Orthopedic surgery was consulted, as well as cardiology given fluid overload  Subjective / 24h Interval events: Complains of soreness in her right hip, she is rocking back and forth.  Denies any chest pain, no shortness of breath.  No abdominal pain, no nausea or vomiting.  Assessment & Plan: Principal Problem Closed fracture of the right proximal femur -After mechanical fall, no syncopal episodes.  Orthopedic surgery consulted and evaluated patient, she is status post intertrochanteric IM nail repair on 05/10/2019 by Dr. Kurtis Bushman -2D echo and cardiology consult were requested by admitting physician, 2D echo done 11/16 showed normal EF 55-60%  Active Problems Coronary artery disease status post CABG in 2019 -This is chronic, she appears stable from this standpoint.  EKG is without acute ischemic changes -Continue Lopressor -May need statin at discharge  Fluid overload/pulmonary venous congestion -Respiratory status stable, on room air -2D echo with normal EF -Received Lasix x2.  Type 1 diabetes mellitus with history of gastroparesis -Persistently elevated CBGs, increase Lantus to 12 units twice daily, increase to resistant sliding scale and add small dose mealtime coverage.  CBG this morning prebreakfast 339, post breakfast 414  CBG (last 3)  Recent Labs    05/11/19 2117 05/12/19 0820 05/12/19 0947  GLUCAP  145* 339* 414*    Iron deficiency anemia -Hemoglobin stable, 9.5, monitor  Chronic kidney disease stage IIIa -Likely in the setting of existing diabetes, baseline creatinine 1.1-1.3, currently close to baseline -Complicated by hyperkalemia, will give a dose of Lokelma today  Leukocytosis -Likely reactive, monitor.  White count slightly up at 14.1.  Monitor for fever or signs of infection  Hypertension -Continue Lopressor, blood pressure stable today  Anxiety/depression -Continue home medications  Scheduled Meds: . busPIRone  10 mg Oral TID  . docusate sodium  100 mg Oral BID  . enoxaparin (LOVENOX) injection  40 mg Subcutaneous Q24H  . insulin aspart  0-20 Units Subcutaneous TID WC  . insulin aspart  0-5 Units Subcutaneous QHS  . insulin aspart  4 Units Subcutaneous TID WC  . insulin glargine  8 Units Subcutaneous BID  . metoprolol tartrate  12.5 mg Oral BID  . pregabalin  150 mg Oral BID  . sodium zirconium cyclosilicate  10 g Oral Once  . tranexamic acid (CYKLOKAPRON) topical -INTRAOP  2,000 mg Topical Once   Continuous Infusions: . lactated ringers 75 mL/hr at 05/10/19 2311   PRN Meds:.HYDROcodone-acetaminophen, LORazepam, morphine injection, ondansetron (ZOFRAN) IV, ondansetron **OR** ondansetron (ZOFRAN) IV, prochlorperazine, senna-docusate  DVT prophylaxis: SCDs Code Status: Full code Family Communication: Discussed with patient Disposition Plan: To be determined  Consultants:  Orthopedic surgery Cardiology  Procedures:  2D echo: Pending  Microbiology  SARS-CoV-2 11/14-pending SARS-CoV-2 11/15-pending MRSA PCR-negative  Antimicrobials: None   Objective: Vitals:   05/11/19 0504 05/11/19 1656 05/12/19 0308 05/12/19 0434  BP: (!) 127/48 (!) 122/56 (!) 125/46   Pulse: 94 85 97   Resp:  17 16   Temp: 99.9  F (37.7 C) 98 F (36.7 C) 100 F (37.8 C)   TempSrc: Oral Oral Oral   SpO2: 99% 97% 93%   Weight:    75.2 kg  Height:        Intake/Output  Summary (Last 24 hours) at 05/12/2019 0957 Last data filed at 05/12/2019 0049 Gross per 24 hour  Intake 240 ml  Output 2650 ml  Net -2410 ml   Filed Weights   05/09/19 2017 05/11/19 0500 05/12/19 0434  Weight: 73.5 kg 74.8 kg 75.2 kg    Examination:  Constitutional: Laying in bed, appears quite anxious and rigid not wanting to move Eyes: No scleral icterus seen ENMT: Moist mucous membranes Neck: normal, supple Respiratory: No wheezing or crackles, moves air well Cardiovascular: Regular rate and rhythm, no murmurs, trace edema Abdomen: Nontender, nondistended, positive bowel sounds Musculoskeletal: no clubbing / cyanosis.  Skin: No rashes Neurologic: Nonfocal, equal strength, patient resistant to moving Psychiatric: Anxious   Data Reviewed: I have independently reviewed following labs and imaging studies   CBC: Recent Labs  Lab 05/09/19 2317 05/10/19 0034 05/10/19 2207 05/11/19 0521 05/12/19 0406  WBC 15.9* 14.6* 11.9* 9.5 14.1*  NEUTROABS 13.6*  --   --   --   --   HGB 8.6* 8.5* 9.8* 9.2* 9.5*  HCT 29.8* 29.8* 31.6* 29.9* 29.8*  MCV 77.8* 79.0* 79.2* 79.1* 78.4*  PLT 402* 402* 326 290 [id number]   Basic Metabolic Panel: Recent Labs  Lab 05/09/19 2317 05/10/19 0034 05/10/19 2207 05/11/19 0521 05/12/19 0406  NA 137 137  --  134* 131*  K 4.1 4.3  --  5.5* 5.4*  CL 103 104  --  103 101  CO2 25 25  --  23 22  GLUCOSE 122* 153*  --  290* 280*  BUN 18 18  --  16 18  CREATININE 1.18* 1.25* 1.17* 1.20* 1.39*  CALCIUM 8.8* 8.8*  --  8.1* 8.2*   Liver Function Tests: Recent Labs  Lab 05/11/19 0521  AST 25  ALT 16  ALKPHOS 74  BILITOT 1.2  PROT 5.8*  ALBUMIN 2.6*   Coagulation Profile: No results for input(s): INR, PROTIME in the last 168 hours. HbA1C: No results for input(s): HGBA1C in the last 72 hours. CBG: Recent Labs  Lab 05/11/19 1207 05/11/19 1651 05/11/19 2117 05/12/19 0820 05/12/19 0947  GLUCAP 169* 113* 145* 339* 414*    Recent Results  (from the past 240 hour(s))  SARS CORONAVIRUS 2 (TAT 6-24 HRS) Nasopharyngeal Nasopharyngeal Swab     Status: None   Collection Time: 05/09/19 11:18 PM   Specimen: Nasopharyngeal Swab  Result Value Ref Range Status   SARS Coronavirus 2 NEGATIVE NEGATIVE Final    Comment: (NOTE) SARS-CoV-2 target nucleic acids are NOT DETECTED. The SARS-CoV-2 RNA is generally detectable in upper and lower respiratory specimens during the acute phase of infection. Negative results do not preclude SARS-CoV-2 infection, do not rule out co-infections with other pathogens, and should not be used as the sole basis for treatment or other patient management decisions. Negative results must be combined with clinical observations, patient history, and epidemiological information. The expected result is Negative. Fact Sheet for Patients: SugarRoll.be Fact Sheet for Healthcare Providers: https://www.woods-mathews.com/ This test is not yet approved or cleared by the [place] FDA and  has been authorized for detection and/or diagnosis of SARS-CoV-2 by FDA under an Emergency Use Authorization (EUA). This EUA will remain  in effect (meaning this test can be used) for the duration  of the COVID-19 declaration under Section 56 4(b)(1) of the Act, 21 U.S.C. section 360bbb-3(b)(1), unless the authorization is terminated or revoked sooner. Performed at [facility], [place] [address], [place], [place] [address]   Surgical pcr screen     Status: None   Collection Time: 05/10/19 12:41 AM   Specimen: Nasal Mucosa; Nasal Swab  Result Value Ref Range Status   MRSA, PCR NEGATIVE NEGATIVE Final   Staphylococcus aureus NEGATIVE NEGATIVE Final    Comment: (NOTE) The Xpert SA Assay (FDA approved for NASAL specimens in patients 57 years of age and older), is one component of a comprehensive surveillance program. It is not intended to diagnose infection nor to guide or monitor  treatment. Performed at [facility], [address] [place], [place] [address]   SARS Coronavirus 2 by RT PCR (hospital order, performed in [facility] hospital lab) Nasopharyngeal Nasopharyngeal Swab     Status: None   Collection Time: 05/10/19 10:02 AM   Specimen: Nasopharyngeal Swab  Result Value Ref Range Status   SARS Coronavirus 2 NEGATIVE NEGATIVE Final    Comment: (NOTE) If result is NEGATIVE SARS-CoV-2 target nucleic acids are NOT DETECTED. The SARS-CoV-2 RNA is generally detectable in upper and lower  respiratory specimens during the acute phase of infection. The lowest  concentration of SARS-CoV-2 viral copies this assay can detect is 250  copies / mL. A negative result does not preclude SARS-CoV-2 infection  and should not be used as the sole basis for treatment or other  patient management decisions.  A negative result may occur with  improper specimen collection / handling, submission of specimen other  than nasopharyngeal swab, presence of viral mutation(s) within the  areas targeted by this assay, and inadequate number of viral copies  (<250 copies / mL). A negative result must be combined with clinical  observations, patient history, and epidemiological information. If result is POSITIVE SARS-CoV-2 target nucleic acids are DETECTED. The SARS-CoV-2 RNA is generally detectable in upper and lower  respiratory specimens dur ing the acute phase of infection.  Positive  results are indicative of active infection with SARS-CoV-2.  Clinical  correlation with patient history and other diagnostic information is  necessary to determine patient infection status.  Positive results do  not rule out bacterial infection or co-infection with other viruses. If result is PRESUMPTIVE POSTIVE SARS-CoV-2 nucleic acids MAY BE PRESENT.   A presumptive positive result was obtained on the submitted specimen  and confirmed on repeat testing.  While 2019 novel coronavirus   (SARS-CoV-2) nucleic acids may be present in the submitted sample  additional confirmatory testing may be necessary for epidemiological  and / or clinical management purposes  to differentiate between  SARS-CoV-2 and other Sarbecovirus currently known to infect humans.  If clinically indicated additional testing with an alternate test  methodology [phone]) is advised. The SARS-CoV-2 RNA is generally  detectable in upper and lower respiratory sp ecimens during the acute  phase of infection. The expected result is Negative. Fact Sheet for Patients:  StrictlyIdeas.no Fact Sheet for Healthcare Providers: BankingDealers.co.za This test is not yet approved or cleared by the [place] FDA and has been authorized for detection and/or diagnosis of SARS-CoV-2 by FDA under an Emergency Use Authorization (EUA).  This EUA will remain in effect (meaning this test can be used) for the duration of the COVID-19 declaration under Section 564(b)(1) of the Act, 21 U.S.C. section 360bbb-3(b)(1), unless the authorization is terminated or revoked sooner. Performed  at [facility], [address] [place], [place]  [address]      Radiology Studies: No results found.  Marzetta Board, MD, PhD [facility]  Between 7 am - 7 pm I am available, please contact me via Amion or Securechat  Between 7 pm - 7 am I am not available, please contact night coverage MD/APP via Amion

## 2019-05-12 NOTE — Progress Notes (Signed)
     05/12/19 1500  Clinical Encounter Type  Visited With Patient;Health care provider  Visit Type Initial  Referral From Nurse  Consult/Referral To Nurse  Spiritual Encounters  Spiritual Needs Prayer;Emotional  Stress Factors  Patient Stress Factors Health changes   Chaplain received a referral from the Asst. Unit Director to visit the patient. Upon arrival, the patient was reclined in bed with the lights on. She was welcoming, awake, alert, and oriented and reported that she was resting a bit. The patient shared what brought her to the hospital and reported that she was dealing with some worries and anxieties around her health, wanting life to look and be different, and not wanting to be a burden to her family. The patient reported that she was feeling down about her recent health challenges, including a triple bypass, gastroparesis, diabetes, etc. This chaplain provided support in the form of active and reflective listening, compassionate ministerial presence, re-framing, encouragement, motivation, etc. The patient reported that she is in pain, but ready to work with PT so that she can move ahead in her recovery process. This chaplain offered prayer; patient accepted.

## 2019-05-12 NOTE — Evaluation (Signed)
Physical Therapy Evaluation Patient Details Name: [name] MRN: [id number] DOB: 01-22-1966 Today's Date: 05/12/2019   History of Present Illness   53 y.o. female with medical history significant for CAD s/p CABG, type 1 diabetes with neuropathy and gastroparesis, hypertension, and depression with anxiety.  Pt fell off bench fracturing R hip, needing ORIF nailing 11/15 and was not seen POD1 secondary to elevated lab valused and blood glucose issues.  Clinical Impression  Pt is POD2 from R hip nailing at time of PT exam. Blood Guc 129 pre exam.  Pt struggled the entire time with pain and anxiety and needed constant cuing, reinforcement and extra time.  She struggled to fully engage quads for quad sets and most exercises required considerable AAROM and again extra time and encouragement.  Despite some trepidation she was willing to try some mobility and standing.  Pt needed very heavy assist to rise from bed, was heavily reliant on the walker and though she was able to do some minimal side steps and weight shifts she was essentially unable and unsafe to try ambulation.  Pt was hoping to go home but per today's performance she realizes she will need STR unless she makes considerable gains.  Pt's O2 was in the high 90 and HR 100-110 t/o the entire session.    Follow Up Recommendations SNF    Equipment Recommendations  (Pt states she does have a walker at home)    Recommendations for Other Services       Precautions / Restrictions Precautions Precautions: Fall Restrictions Weight Bearing Restrictions: Yes RLE Weight Bearing: Weight bearing as tolerated      Mobility  Bed Mobility Overal bed mobility: Needs Assistance Bed Mobility: Supine to Sit;Sit to Supine     Supine to sit: Mod assist;Max assist Sit to supine: Max assist   General bed mobility comments: Pt showed good effort in getting toward EOB but needed assist with R LE and was very slow and labored needing constant min  and occasionally increased cuing to attain standing  Transfers Overall transfer level: Needs assistance Equipment used: Rolling walker (2 wheeled) Transfers: Sit to/from Stand Sit to Stand: Max assist         General transfer comment: Pt was completely unable to initiate upward movement w/o considerable assist. First 2 attempts aborted as she needed to "wait, wait" and needed further reinfocement and assurance that it was okay and that PT would assist.  She finally did attain standing with max assist.  Ambulation/Gait Ambulation/Gait assistance: Mod assist;Max assist Gait Distance (Feet): 2 Feet Assistive device: Rolling walker (2 wheeled)       General Gait Details: Pt very cautious and guarded with standing/ambulation.  She did ultimately show ability to transfer some weight to R and trial a few side steps along EOB but when we tried to take a forward step she was able to move R forward, but when advancing L struggled to fully take weight and was not steady and had slight buckle along with growing anxiety response.  Further ambulation deferred.  Stairs            Wheelchair Mobility    Modified Rankin (Stroke Patients Only)       Balance                                             Pertinent Vitals/Pain Pain Assessment:  0-10 Pain Score: 9  Pain Location: R hip    Home Living Family/patient expects to be discharged to:: Unsure Living Arrangements: Other relatives               Additional Comments: lives with sister, she is often gone (work, Science writer, Social research officer, government)  Pt has 2 steps to enter home (b/l rails) and the 5 steps in home to get to bedroom (with attached bath)    Prior Function Level of Independence: Independent         Comments: pt reports when she has her episodes of gastroparesis she has increased difficulty with mobility/ADL tasks; sister only minimally assists     Hand Dominance        Extremity/Trunk Assessment   Upper  Extremity Assessment Upper Extremity Assessment: Generalized weakness;Overall WFL for tasks assessed    Lower Extremity Assessment Lower Extremity Assessment: Generalized weakness(very limited R LE AROM, extremely pain guarded and stiff)       Communication   Communication: No difficulties  Cognition Arousal/Alertness: Awake/alert Behavior During Therapy: Anxious Overall Cognitive Status: Within Functional Limits for tasks assessed                                 General Comments: Pt needed excessive reinforcement and encouragement to participate fully an in timely manner      General Comments      Exercises General Exercises - Lower Extremity Ankle Circles/Pumps: AROM;10 reps Quad Sets: AROM;10 reps Gluteal Sets: AROM;10 reps Short Arc Quad: AAROM;10 reps Heel Slides: PROM;AAROM;5 reps Hip ABduction/ADduction: AAROM;5 reps   Assessment/Plan    PT Assessment Patient needs continued PT services  PT Problem List Decreased strength;Decreased range of motion;Decreased activity tolerance;Decreased balance;Decreased mobility;Decreased coordination;Decreased knowledge of use of DME;Decreased safety awareness;Pain       PT Treatment Interventions DME instruction;Gait training;Stair training;Functional mobility training;Therapeutic activities;Therapeutic exercise;Balance training;Neuromuscular re-education;Patient/family education    PT Goals (Current goals can be found in the Care Plan section)  Acute Rehab PT Goals Patient Stated Goal: control pain and get back to walking PT Goal Formulation: With patient Time For Goal Achievement: 05/26/19 Potential to Achieve Goals: Fair    Frequency BID   Barriers to discharge        Co-evaluation               AM-PAC PT "6 Clicks" Mobility  Outcome Measure Help needed turning from your back to your side while in a flat bed without using bedrails?: Total Help needed moving from lying on your back to sitting on  the side of a flat bed without using bedrails?: Total Help needed moving to and from a bed to a chair (including a wheelchair)?: Total Help needed standing up from a chair using your arms (e.g., wheelchair or bedside chair)?: Total Help needed to walk in hospital room?: Total Help needed climbing 3-5 steps with a railing? : Total 6 Click Score: 6    End of Session Equipment Utilized During Treatment: Gait belt Activity Tolerance: Patient limited by pain Patient left: with bed alarm set;with call bell/phone within reach Nurse Communication: Mobility status PT Visit Diagnosis: Muscle weakness (generalized) (M62.81);Difficulty in walking, not elsewhere classified (R26.2);Pain Pain - Right/Left: Right Pain - part of body: Hip    Time: [id number] PT Time Calculation (min) (ACUTE ONLY): 39 min   Charges:   PT Evaluation $PT Eval Low Complexity: 1 Low PT Treatments $Therapeutic Exercise: 8-22 mins $  Therapeutic Activity: 8-22 mins        Kreg Shropshire, DPT 05/12/2019, 6:08 PM

## 2019-05-13 LAB — BASIC METABOLIC PANEL
Anion gap: 6 (ref 5–15)
BUN: 23 mg/dL — ABNORMAL HIGH (ref 6–20)
CO2: 25 mmol/L (ref 22–32)
Calcium: 8.6 mg/dL — ABNORMAL LOW (ref 8.9–10.3)
Chloride: 103 mmol/L (ref 98–111)
Creatinine, Ser: 1.39 mg/dL — ABNORMAL HIGH (ref 0.44–1.00)
GFR calc Af Amer: 51 mL/min — ABNORMAL LOW (ref >60)
GFR calc non Af Amer: 44 mL/min — ABNORMAL LOW (ref >60)
Glucose, Bld: 221 mg/dL — ABNORMAL HIGH (ref 70–99)
Potassium: 4.9 mmol/L (ref 3.5–5.1)
Sodium: 134 mmol/L — ABNORMAL LOW (ref 135–145)

## 2019-05-13 LAB — CBC
HCT: 28.7 % — ABNORMAL LOW (ref 36.0–46.0)
Hemoglobin: 8.8 g/dL — ABNORMAL LOW (ref 12.0–15.0)
MCH: 24.2 pg — ABNORMAL LOW (ref 26.0–34.0)
MCHC: 30.7 g/dL (ref 30.0–36.0)
MCV: 78.8 fL — ABNORMAL LOW (ref 80.0–100.0)
Platelets: 289 10*3/uL (ref 150–400)
RBC: 3.64 MIL/uL — ABNORMAL LOW (ref 3.87–5.11)
RDW: 21.7 % — ABNORMAL HIGH (ref 11.5–15.5)
WBC: 12.1 10*3/uL — ABNORMAL HIGH (ref 4.0–10.5)
nRBC: 0 % (ref 0.0–0.2)

## 2019-05-13 LAB — GLUCOSE, CAPILLARY
Glucose-Capillary: 129 mg/dL — ABNORMAL HIGH (ref 70–99)
Glucose-Capillary: 144 mg/dL — ABNORMAL HIGH (ref 70–99)
Glucose-Capillary: 251 mg/dL — ABNORMAL HIGH (ref 70–99)
Glucose-Capillary: 164 mg/dL — ABNORMAL HIGH (ref 70–99)
Glucose-Capillary: 164 mg/dL — ABNORMAL HIGH (ref 70–99)
Glucose-Capillary: 181 mg/dL — ABNORMAL HIGH (ref 70–99)

## 2019-05-13 NOTE — Plan of Care (Signed)
Though hesitant at first, patient participated in care. Sat in chair prior to and after lunch without complaint.  Medicated once with prn norco this shift.  Currently back in bed, call bell in reach.

## 2019-05-13 NOTE — Progress Notes (Signed)
PROGRESS NOTE  [name] [id number] DOB: September 10, 1965 DOA: 05/09/2019 PCP: [name], MD   LOS: 4 days   Brief Narrative / Interim history: 53 year old female with type 1 diabetes mellitus since age 40, coronary artery disease with CABG in 2019, neuropathy/gastroparesis, hypertension, depression, anxiety who came into the ED after right hip pain following a fall.  X-ray in the ED showed a fracture to the proximal right femoral shaft with varus angulation.  Chest x-ray did show some changes of venous congestion without focal consolidation or pleural effusion.  Orthopedic surgery was consulted, as well as cardiology given fluid overload  Subjective / 24h Interval events: Feels a little better today, still complains of soreness.  No chest pain, no abdominal pain, no nausea or vomiting.  Assessment & Plan: Principal Problem Closed fracture of the right proximal femur -After mechanical fall, no syncopal episodes.  Orthopedic surgery consulted and evaluated patient, she is status post intertrochanteric IM nail repair on 05/10/2019 by Dr. Kurtis Bushman -2D echo and cardiology consult were requested by admitting physician, 2D echo done 11/16 showed normal EF 55-60% -PT recommended SNF, patient agreeable, consulted social worker today  Active Problems Coronary artery disease status post CABG in 2019 -This is chronic, she appears stable from this standpoint.  EKG is without acute ischemic changes -Continue Lopressor -We will need statin on discharge  Fluid overload/pulmonary venous congestion -2D echo with normal EF -Received Lasix x2.  Currently appears euvolemic, on room air  Type 1 diabetes mellitus with history of gastroparesis -Persistently elevated CBGs, increase Lantus to 12 units twice daily, increase to resistant sliding scale and add small dose mealtime coverage.  CBG profile slightly better, continue to monitor  CBG (last 3)  Recent Labs    05/12/19 0947 05/12/19 1200  05/13/19 0800  GLUCAP 414* 331* 251*    Iron deficiency anemia -Hemoglobin stable, 9.5, monitor,  Chronic kidney disease stage IIIa -Likely in the setting of existing diabetes, baseline creatinine 1.1-1.3, currently close to baseline -Hyperkalemia resolved with Lokelma  Leukocytosis -Likely reactive, monitor.  Stable, no fever  Hypertension -Continue Lopressor, blood pressure acceptable today  Anxiety/depression -Continue home medications  Scheduled Meds: . busPIRone  10 mg Oral TID  . docusate sodium  100 mg Oral BID  . enoxaparin (LOVENOX) injection  40 mg Subcutaneous Q24H  . insulin aspart  0-20 Units Subcutaneous TID WC  . insulin aspart  0-5 Units Subcutaneous QHS  . insulin glargine  12 Units Subcutaneous BID  . metoprolol tartrate  12.5 mg Oral BID  . pregabalin  150 mg Oral BID  . tranexamic acid (CYKLOKAPRON) topical -INTRAOP  2,000 mg Topical Once   Continuous Infusions: . lactated ringers 75 mL/hr at 05/10/19 2311   PRN Meds:.HYDROcodone-acetaminophen, LORazepam, morphine injection, ondansetron (ZOFRAN) IV, ondansetron **OR** ondansetron (ZOFRAN) IV, prochlorperazine, senna-docusate  DVT prophylaxis: SCDs Code Status: Full code Family Communication: Discussed with patient Disposition Plan: To be determined  Consultants:  Orthopedic surgery Cardiology  Procedures:  2D echo: Pending  Microbiology  SARS-CoV-2 11/14-pending SARS-CoV-2 11/15-pending MRSA PCR-negative  Antimicrobials: None   Objective: Vitals:   05/12/19 0434 05/12/19 1414 05/12/19 2357 05/13/19 0804  BP:  (!) 134/57 (!) 140/53 (!) 139/57  Pulse:  94 90 89  Resp:  18 17 17   Temp:  98.4 F (36.9 C) 98.2 F (36.8 C) 98.6 F (37 C)  TempSrc:  Oral    SpO2:  95% 95% 97%  Weight: 75.2 kg     Height:  Intake/Output Summary (Last 24 hours) at 05/13/2019 1152 Last data filed at 05/13/2019 0900 Gross per 24 hour  Intake 480 ml  Output 200 ml  Net 280 ml   Filed  Weights   05/09/19 2017 05/11/19 0500 05/12/19 0434  Weight: 73.5 kg 74.8 kg 75.2 kg    Examination:  Constitutional: NAD, rocking back and forth Eyes: No scleral icterus ENMT: Moist mucous membranes Neck: normal, supple Respiratory: No wheezing, no crackles, moves air well Cardiovascular: Regular rate and rhythm, no murmurs, trace lower extremity edema Abdomen: Soft, nontender, nondistended, positive bowel sounds Musculoskeletal: no clubbing / cyanosis.  Skin: No rashes seen Neurologic: Nonfocal, equal strength Psychiatric: Anxious   Data Reviewed: I have independently reviewed following labs and imaging studies   CBC: Recent Labs  Lab 05/09/19 2317 05/10/19 0034 05/10/19 2207 05/11/19 0521 05/12/19 0406 05/13/19 0413  WBC 15.9* 14.6* 11.9* 9.5 14.1* 12.1*  NEUTROABS 13.6*  --   --   --   --   --   HGB 8.6* 8.5* 9.8* 9.2* 9.5* 8.8*  HCT 29.8* 29.8* 31.6* 29.9* 29.8* 28.7*  MCV 77.8* 79.0* 79.2* 79.1* 78.4* 78.8*  PLT 402* 402* 326 290 308 [id number]   Basic Metabolic Panel: Recent Labs  Lab 05/09/19 2317 05/10/19 0034 05/10/19 2207 05/11/19 0521 05/12/19 0406 05/13/19 0413  NA 137 137  --  134* 131* 134*  K 4.1 4.3  --  5.5* 5.4* 4.9  CL 103 104  --  103 101 103  CO2 25 25  --  23 22 25   GLUCOSE 122* 153*  --  290* 280* 221*  BUN 18 18  --  16 18 23*  CREATININE 1.18* 1.25* 1.17* 1.20* 1.39* 1.39*  CALCIUM 8.8* 8.8*  --  8.1* 8.2* 8.6*   Liver Function Tests: Recent Labs  Lab 05/11/19 0521  AST 25  ALT 16  ALKPHOS 74  BILITOT 1.2  PROT 5.8*  ALBUMIN 2.6*   Coagulation Profile: No results for input(s): INR, PROTIME in the last 168 hours. HbA1C: No results for input(s): HGBA1C in the last 72 hours. CBG: Recent Labs  Lab 05/11/19 2117 05/12/19 0820 05/12/19 0947 05/12/19 1200 05/13/19 0800  GLUCAP 145* 339* 414* 331* 251*    Recent Results (from the past 240 hour(s))  SARS CORONAVIRUS 2 (TAT 6-24 HRS) Nasopharyngeal Nasopharyngeal Swab      Status: None   Collection Time: 05/09/19 11:18 PM   Specimen: Nasopharyngeal Swab  Result Value Ref Range Status   SARS Coronavirus 2 NEGATIVE NEGATIVE Final    Comment: (NOTE) SARS-CoV-2 target nucleic acids are NOT DETECTED. The SARS-CoV-2 RNA is generally detectable in upper and lower respiratory specimens during the acute phase of infection. Negative results do not preclude SARS-CoV-2 infection, do not rule out co-infections with other pathogens, and should not be used as the sole basis for treatment or other patient management decisions. Negative results must be combined with clinical observations, patient history, and epidemiological information. The expected result is Negative. Fact Sheet for Patients: SugarRoll.be Fact Sheet for Healthcare Providers: https://www.woods-mathews.com/ This test is not yet approved or cleared by the [place] FDA and  has been authorized for detection and/or diagnosis of SARS-CoV-2 by FDA under an Emergency Use Authorization (EUA). This EUA will remain  in effect (meaning this test can be used) for the duration of the COVID-19 declaration under Section 56 4(b)(1) of the Act, 21 U.S.C. section 360bbb-3(b)(1), unless the authorization is terminated or revoked sooner. Performed at [facility]  Lab, [address] [place], [place] [address]   Surgical pcr screen     Status: None   Collection Time: 05/10/19 12:41 AM   Specimen: Nasal Mucosa; Nasal Swab  Result Value Ref Range Status   MRSA, PCR NEGATIVE NEGATIVE Final   Staphylococcus aureus NEGATIVE NEGATIVE Final    Comment: (NOTE) The Xpert SA Assay (FDA approved for NASAL specimens in patients 40 years of age and older), is one component of a comprehensive surveillance program. It is not intended to diagnose infection nor to guide or monitor treatment. Performed at [facility], [address] [place], [place] [address]   SARS  Coronavirus 2 by RT PCR (hospital order, performed in [facility] hospital lab) Nasopharyngeal Nasopharyngeal Swab     Status: None   Collection Time: 05/10/19 10:02 AM   Specimen: Nasopharyngeal Swab  Result Value Ref Range Status   SARS Coronavirus 2 NEGATIVE NEGATIVE Final    Comment: (NOTE) If result is NEGATIVE SARS-CoV-2 target nucleic acids are NOT DETECTED. The SARS-CoV-2 RNA is generally detectable in upper and lower  respiratory specimens during the acute phase of infection. The lowest  concentration of SARS-CoV-2 viral copies this assay can detect is 250  copies / mL. A negative result does not preclude SARS-CoV-2 infection  and should not be used as the sole basis for treatment or other  patient management decisions.  A negative result may occur with  improper specimen collection / handling, submission of specimen other  than nasopharyngeal swab, presence of viral mutation(s) within the  areas targeted by this assay, and inadequate number of viral copies  (<250 copies / mL). A negative result must be combined with clinical  observations, patient history, and epidemiological information. If result is POSITIVE SARS-CoV-2 target nucleic acids are DETECTED. The SARS-CoV-2 RNA is generally detectable in upper and lower  respiratory specimens dur ing the acute phase of infection.  Positive  results are indicative of active infection with SARS-CoV-2.  Clinical  correlation with patient history and other diagnostic information is  necessary to determine patient infection status.  Positive results do  not rule out bacterial infection or co-infection with other viruses. If result is PRESUMPTIVE POSTIVE SARS-CoV-2 nucleic acids MAY BE PRESENT.   A presumptive positive result was obtained on the submitted specimen  and confirmed on repeat testing.  While 2019 novel coronavirus  (SARS-CoV-2) nucleic acids may be present in the submitted sample  additional confirmatory testing may be  necessary for epidemiological  and / or clinical management purposes  to differentiate between  SARS-CoV-2 and other Sarbecovirus currently known to infect humans.  If clinically indicated additional testing with an alternate test  methodology [phone]) is advised. The SARS-CoV-2 RNA is generally  detectable in upper and lower respiratory sp ecimens during the acute  phase of infection. The expected result is Negative. Fact Sheet for Patients:  StrictlyIdeas.no Fact Sheet for Healthcare Providers: BankingDealers.co.za This test is not yet approved or cleared by the [place] FDA and has been authorized for detection and/or diagnosis of SARS-CoV-2 by FDA under an Emergency Use Authorization (EUA).  This EUA will remain in effect (meaning this test can be used) for the duration of the COVID-19 declaration under Section 564(b)(1) of the Act, 21 U.S.C. section 360bbb-3(b)(1), unless the authorization is terminated or revoked sooner. Performed at [facility], [address] [place], [place] [address]      Radiology Studies: No results found.  Marzetta Board, MD, PhD [facility]  Between 7 am - 7 pm I am available, please contact me via Amion or Securechat  Between 7 pm - 7 am I am not available, please contact night coverage MD/APP via Amion

## 2019-05-13 NOTE — NC FL2 (Signed)
[place] LEVEL OF CARE SCREENING TOOL     IDENTIFICATION  Patient Name: [name] Birthdate: 1966-02-03 Sex: female Admission Date (Current Location): 05/09/2019  [place] and [place] Number:  Engineering geologist and Address:  [facility], [address], [place], [place] [address]      Provider Number: [id number]  Attending Physician Name and Address:  Caren Griffins, MD  Relative Name and Phone Number:  Elmer Sow [phone]    Current Level of Care: Hospital Recommended Level of Care: [place] Prior Approval Number:    Date Approved/Denied:   PASRR Number: [id number]  Discharge Plan: SNF    Current Diagnoses: Patient Active Problem List   Diagnosis Date Noted  . Closed fracture of proximal end of femur, right, initial encounter ([place]) 05/09/2019  . CAD (coronary artery disease) 03/06/2019  . Acute encephalopathy 12/22/2018  . Diabetic ketoacidosis ([place]) 04/30/2018  . Gastroparesis 04/13/2018  . DKA (diabetic ketoacidosis) ([place]) 03/31/2018  . S/P CABG x 3 01/14/2018  . CAD (coronary artery disease), native coronary artery 01/11/2018  . Dehydration   . MDD (major depressive disorder), single episode, severe , no psychosis ([place])   . DKA (diabetic ketoacidoses) ([place]) 06/16/2017  . Tardive dyskinesia   . Type 1 diabetes mellitus with hyperlipidemia ([place]) 03/18/2016  . Noncompliance with treatment plan 03/13/2016  . Essential hypertension 01/24/2016  . HLD (hyperlipidemia)   . GERD (gastroesophageal reflux disease)   . AKI (acute kidney injury) ([place])   . Anemia, iron deficiency   . Vitamin B12 deficiency 08/16/2015  . Diabetic gastroparesis ([place])   . Diabetic neuropathy, type I diabetes mellitus ([place]) 05/18/2015  . Anxiety and depression 05/18/2015    Orientation RESPIRATION BLADDER Height & Weight     Self, Time, Situation, Place  Normal Continent Weight: 75.2 kg Height:  5\' 1"  (154.9 cm)   BEHAVIORAL SYMPTOMS/MOOD NEUROLOGICAL BOWEL NUTRITION STATUS      Continent Diet  AMBULATORY STATUS COMMUNICATION OF NEEDS Skin   Limited Assist Verbally Surgical wounds(right hip)                       Personal Care Assistance Level of Assistance  Bathing, Feeding, Dressing Bathing Assistance: Limited assistance Feeding assistance: Limited assistance Dressing Assistance: Limited assistance     Functional Limitations Info             SPECIAL CARE FACTORS FREQUENCY  PT (By licensed PT), OT (By licensed OT)     PT Frequency: 5 times per week OT Frequency: 5 times per week            Contractures Contractures Info: Not present    Additional Factors Info  Code Status, Allergies Code Status Info: Full Allergies Info: anesthetics, amide, chlorhexidine, PCN, buprenorphine, encainide, metoclopramide           Current Medications (05/13/2019):  This is the current hospital active medication list Current Facility-Administered Medications  Medication Dose Route Frequency Provider Last Rate Last Dose  . busPIRone (BUSPAR) tablet 10 mg  10 mg Oral TID Lovell Sheehan, MD   10 mg at 05/13/19 [id number]  . docusate sodium (COLACE) capsule 100 mg  100 mg Oral BID Lovell Sheehan, MD   100 mg at 05/13/19 0851  . enoxaparin (LOVENOX) injection 40 mg  40 mg Subcutaneous Q24H Lovell Sheehan, MD   40 mg at 05/13/19 0850  . HYDROcodone-acetaminophen (NORCO/VICODIN) 5-325 MG per tablet 1-2 tablet  1-2  tablet Oral Q6H PRN Lovell Sheehan, MD   2 tablet at 05/13/19 [phone]  . insulin aspart (novoLOG) injection 0-20 Units  0-20 Units Subcutaneous TID WC Caren Griffins, MD   11 Units at 05/13/19 0851  . insulin aspart (novoLOG) injection 0-5 Units  0-5 Units Subcutaneous QHS Gherghe, Costin M, MD      . insulin glargine (LANTUS) injection 12 Units  12 Units Subcutaneous BID Caren Griffins, MD   12 Units at 05/12/19 2131  . lactated ringers infusion   Intravenous Continuous Lovell Sheehan, MD 75 mL/hr at 05/10/19 2311    . LORazepam (ATIVAN) injection 1 mg  1 mg Intravenous Once PRN Lovell Sheehan, MD      . metoprolol tartrate (LOPRESSOR) tablet 12.5 mg  12.5 mg Oral BID Lovell Sheehan, MD   12.5 mg at 05/13/19 [id number]  . morphine 2 MG/ML injection 1 mg  1 mg Intravenous Q2H PRN Lovell Sheehan, MD   1 mg at 05/11/19 2121  . ondansetron (ZOFRAN) injection 4 mg  4 mg Intravenous Q6H PRN Lovell Sheehan, MD   4 mg at 05/10/19 2142  . ondansetron (ZOFRAN) tablet 4 mg  4 mg Oral Q6H PRN Lovell Sheehan, MD       Or  . ondansetron [facility]) injection 4 mg  4 mg Intravenous Q6H PRN Lovell Sheehan, MD      . pregabalin (LYRICA) capsule 150 mg  150 mg Oral BID Lovell Sheehan, MD   150 mg at 05/13/19 0853  . prochlorperazine (COMPAZINE) injection 10 mg  10 mg Intravenous Q6H PRN Lang Snow, NP   10 mg at 05/10/19 2346  . senna-docusate (Senokot-S) tablet 1 tablet  1 tablet Oral QHS PRN Lovell Sheehan, MD      . tranexamic acid (CYKLOKAPRON) 2,000 mg in sodium chloride 0.9 % 50 mL Topical Application  [id number] mg Topical Once Lovell Sheehan, MD         Discharge Medications: Please see discharge summary for a list of discharge medications.  Relevant Imaging Results:  Relevant Lab Results:   Additional Information SS# [id number]  Shelbie Hutching, RN

## 2019-05-13 NOTE — Progress Notes (Addendum)
Physical Therapy Treatment Patient Details Name: [name] MRN: [id number] DOB: [birth year] Today's Date: 05/13/2019    History of Present Illness 53 y.o. female with medical history significant for CAD s/p CABG, type 1 diabetes with neuropathy and gastroparesis, hypertension, and depression with anxiety.  Pt fell off bench fracturing R hip, needing ORIF nailing 11/15 and was not seen POD1 secondary to elevated lab values and blood glucose issues.    PT Comments    Pt sitting in recliner upon PT arrival and pt smiling and reporting feeling good up in the chair but pt also reporting needing to urinate.  Pt able to stand from recliner and pivot transfer with RW recliner to [facility] and then with extra effort/time/cueing take steps (with walker) BSC to bed and then lay down in bed (all with 2 assist); see below for details.  Pt still requiring encouragement to participate (less than morning session though); pt appeared to tolerate activities better; requires extra time to process and initiate movement on her own.  Will continue to progress pt with strengthening and progressive functional mobility per pt tolerance.   Follow Up Recommendations  SNF     Equipment Recommendations  Rolling walker with 5" wheels;3in1 (PT)    Recommendations for Other Services OT consult     Precautions / Restrictions Precautions Precautions: Fall Restrictions Weight Bearing Restrictions: Yes RLE Weight Bearing: Weight bearing as tolerated    Mobility  Bed Mobility Overal bed mobility: Needs Assistance Bed Mobility: Sit to Supine     Sit to supine: +2 for physical assistance   General bed mobility comments: 2 assist sit to semi-supine (assist for trunk and B LE's) d/t pt's concern for R LE pain  Transfers Overall transfer level: Needs assistance Equipment used: Rolling walker (2 wheeled) Transfers: Sit to/from [facility] Sit to Stand: Min assist;Mod assist;+2 physical  assistance Stand pivot transfers: Min assist;+2 physical assistance       General transfer comment: vc's and tactile cues for UE/LE placement; assist to initiate and come to full stand; vc's for transfer technique; x1 trial standing from recliner and x1 trial standing from [facility]; stand pivot with RW (pt given vc's to take steps but pt preferring to pivot on B LE's instead of taking steps transferring recliner to [facility])  Ambulation/Gait Ambulation/Gait assistance: Min assist;+2 physical assistance Gait Distance (Feet): 3 Feet Assistive device: Rolling walker (2 wheeled)(BSC to bed)   Gait velocity: significantly decreased   General Gait Details: pt initially with difficulty taking any steps but with extra time and cueing and effort, pt able to take steps BSC to bed with RW; vc's for increasing UE support through RW to offweight R LE   Stairs             Wheelchair Mobility    Modified Rankin (Stroke Patients Only)       Balance Overall balance assessment: Needs assistance Sitting-balance support: No upper extremity supported;Feet supported Sitting balance-Leahy Scale: Good Sitting balance - Comments: steady sitting reaching within BOS   Standing balance support: Bilateral upper extremity supported Standing balance-Leahy Scale: Poor Standing balance comment: pt requiring heavy B UE support on RW for static standing balance                            Cognition Arousal/Alertness: Awake/alert Behavior During Therapy: Anxious Overall Cognitive Status: Within Functional Limits for tasks assessed  General Comments: Pt requiring encouragement to participate in therapy session's activities (d/t pt's concerns of pain) requiring extra time for all session's activities.      Exercises    General Comments General comments (skin integrity, edema, etc.): dressings intact R hip/thigh.  Nursing cleared pt for participation in  physical therapy.  Pt agreeable to PT session.      Pertinent Vitals/Pain Pain Assessment: Faces Pain Location: R hip Pain Descriptors / Indicators: Aching;Guarding;Tender;Sore;Constant Pain Intervention(s): Limited activity within patient's tolerance;Monitored during session;Repositioned;Ice applied;Other (comment)(NT refilled pt's ice pack with ice and and place on pt's R hip)  Vitals (HR and O2 on room air) stable and WFL throughout treatment session.    Home Living                      Prior Function            PT Goals (current goals can now be found in the care plan section) Acute Rehab PT Goals Patient Stated Goal: control pain and get back to walking PT Goal Formulation: With patient Time For Goal Achievement: 05/26/19 Potential to Achieve Goals: Fair Progress towards PT goals: Progressing toward goals    Frequency    BID      PT Plan Current plan remains appropriate    Co-evaluation              AM-PAC PT "6 Clicks" Mobility   Outcome Measure  Help needed turning from your back to your side while in a flat bed without using bedrails?: Total Help needed moving from lying on your back to sitting on the side of a flat bed without using bedrails?: Total Help needed moving to and from a bed to a chair (including a wheelchair)?: Total Help needed standing up from a chair using your arms (e.g., wheelchair or bedside chair)?: Total Help needed to walk in hospital room?: Total Help needed climbing 3-5 steps with a railing? : Total 6 Click Score: 6    End of Session Equipment Utilized During Treatment: Gait belt Activity Tolerance: Patient limited by pain Patient left: in bed;with call bell/phone within reach;with bed alarm set;with SCD's reapplied;Other (comment)(B heels floating via pillow) Nurse Communication: Mobility status;Precautions;Weight bearing status;Other (comment)(pt's pain status) PT Visit Diagnosis: Muscle weakness (generalized)  (M62.81);Difficulty in walking, not elsewhere classified (R26.2);Pain Pain - Right/Left: Right Pain - part of body: Hip     Time: 1347(NT stayed with pt while pt was trying to have BM on [facility]; therapist left room between 1401-1410)-1430 PT Time Calculation (min) (ACUTE ONLY): 43 min  Charges: $Therapeutic Activity: 23-37 mins                     Leitha Bleak, PT 05/13/19, 4:12 PM [phone]

## 2019-05-13 NOTE — Progress Notes (Signed)
Inpatient Diabetes Program Recommendations  AACE/ADA: New Consensus Statement on Inpatient Glycemic Control  Target Ranges:  Prepandial:   less than 140 mg/dL      Peak postprandial:   less than 180 mg/dL (1-2 hours)      Critically ill patients:  140 - 180 mg/dL   Results for [name], [name] (MRN [id number]) as of 05/13/2019 08:12  Ref. Range 05/12/2019 08:20 05/12/2019 09:47 05/12/2019 12:00 05/13/2019 08:00  Glucose-Capillary Latest Ref Range: 70 - 99 mg/dL 339 (H) 414 (H) 331 (H) 251 (H)   Review of Glycemic Control  Diabetes history:DM1 (makes NO insulin; requires basal, correction, and meal coverage insulin) Outpatient Diabetes medications:Lantus 8 units BID, Novolog 10 units with breakfast, Novolog 8-10 units with supper Current orders for Inpatient glycemic control:Lantus 12 units BID, Novolog 0-20 units TID with meals, Novolog 0-5 units QHS  Inpatient Diabetes Program Recommendations:   Insulin - Meal Coverage: Meal coverage insulin was ordered yesterday but has been discontinued.  Please consider ordering Novolog 4 units TID with meals for meal coverage if patient eats at least 50% of meals.  Correction (SSI): Please decrease Novolog correction to moderate scale.  Thanks, Barnie Alderman, RN, MSN, CDE Diabetes Coordinator Inpatient Diabetes Program [phone] (Team Pager from 8am to 5pm)

## 2019-05-13 NOTE — Progress Notes (Signed)
Physical Therapy Treatment Patient Details Name: [name] MRN: [id number] DOB: [birth year] Today's Date: 05/13/2019    History of Present Illness 53 y.o. female with medical history significant for CAD s/p CABG, type 1 diabetes with neuropathy and gastroparesis, hypertension, and depression with anxiety.  Pt fell off bench fracturing R hip, needing ORIF nailing 11/15 and was not seen POD1 secondary to elevated lab values and blood glucose issues.    PT Comments    Pt appearing very apprehensive about therapy d/t concerns for R LE pain (pt initially did not want to move either LE's for ex's in bed d/t pain concerns) but agreeable to session's activities with a lot of encouragement.  Able to sit on edge of bed with 2 assist (d/t R hip/thigh pain) but once sitting pt SBA (good sitting balance).  Able to stand with 2 assist and then with extra time and cueing pt able to eventually take steps on own bed to recliner with RW use (and 2 assist).  Will continue to focus on strengthening, ROM, and progressive functional mobility per pt tolerance.    Follow Up Recommendations  SNF     Equipment Recommendations  Rolling walker with 5" wheels;3in1 (PT)    Recommendations for Other Services OT consult     Precautions / Restrictions Precautions Precautions: Fall Restrictions Weight Bearing Restrictions: Yes RLE Weight Bearing: Weight bearing as tolerated    Mobility  Bed Mobility Overal bed mobility: Needs Assistance Bed Mobility: Supine to Sit     Supine to sit: +2 for physical assistance;HOB elevated     General bed mobility comments: 2 assist semi-supine to sit (assist for trunk and B LE's) d/t pt's concern for R LE pain  Transfers Overall transfer level: Needs assistance Equipment used: Rolling walker (2 wheeled) Transfers: Sit to/from Stand Sit to Stand: Min assist;Mod assist;+2 physical assistance         General transfer comment: vc's and tactile cues for UE/LE  placement; assist to initiate and come to full stand; vc's for transfer technique  Ambulation/Gait Ambulation/Gait assistance: Min assist;+2 physical assistance Gait Distance (Feet): 3 Feet(bed to recliner) Assistive device: Rolling walker (2 wheeled)   Gait velocity: significantly decreased   General Gait Details: pt initially unable to take any steps with B LE's on own so initially requiring assist for weight shift and then to move LE's but then pt able to move B LE's on own with increased effort and time; vc's for walker use and to increase UE support through RW to [employer] R LE   Stairs             Wheelchair Mobility    Modified Rankin (Stroke Patients Only)       Balance Overall balance assessment: Needs assistance Sitting-balance support: No upper extremity supported;Feet supported Sitting balance-Leahy Scale: Good Sitting balance - Comments: steady sitting reaching within BOS   Standing balance support: Bilateral upper extremity supported Standing balance-Leahy Scale: Poor Standing balance comment: pt requiring heavy B UE support on RW for static standing balance                            Cognition Arousal/Alertness: Awake/alert Behavior During Therapy: Anxious Overall Cognitive Status: Within Functional Limits for tasks assessed                                 General Comments: Pt requiring a  lot of encouragement to participate in therapy session's activities (d/t pt's concerns of pain) requiring extra time for all session's activities.      Exercises Total Joint Exercises Ankle Circles/Pumps: AROM;Strengthening;Both;10 reps;Supine Quad Sets: (pt declined d/t pain) Heel Slides: AAROM;Strengthening;Right;10 reps;Supine(minimal ROM d/t pain but ROM did improve with repetition) Hip ABduction/ADduction: AAROM;Strengthening;Right;10 reps;Supine(minimal ROM d/t pain but ROM did improve with repetition)    General Comments General  comments (skin integrity, edema, etc.): dressings intact R hip/thigh.  Nursing cleared pt for participation in physical therapy.  Pt agreeable to PT session.      Pertinent Vitals/Pain Pain Assessment: Faces Faces Pain Scale: Hurts little more Pain Location: R hip Pain Descriptors / Indicators: Aching;Guarding;Tender;Sore;Constant Pain Intervention(s): Limited activity within patient's tolerance;Monitored during session;Repositioned;Other (comment)(nurse notified pt needing ice for ice pack)  Vitals (HR and O2 on room air) stable and WFL throughout treatment session.    Home Living                      Prior Function            PT Goals (current goals can now be found in the care plan section) Acute Rehab PT Goals Patient Stated Goal: control pain and get back to walking PT Goal Formulation: With patient Time For Goal Achievement: 05/26/19 Potential to Achieve Goals: Fair Progress towards PT goals: Progressing toward goals    Frequency    BID      PT Plan Current plan remains appropriate    Co-evaluation              AM-PAC PT "6 Clicks" Mobility   Outcome Measure  Help needed turning from your back to your side while in a flat bed without using bedrails?: Total Help needed moving from lying on your back to sitting on the side of a flat bed without using bedrails?: Total Help needed moving to and from a bed to a chair (including a wheelchair)?: Total Help needed standing up from a chair using your arms (e.g., wheelchair or bedside chair)?: Total Help needed to walk in hospital room?: Total Help needed climbing 3-5 steps with a railing? : Total 6 Click Score: 6    End of Session Equipment Utilized During Treatment: Gait belt Activity Tolerance: Patient limited by pain Patient left: in chair;with call bell/phone within reach;with chair alarm set;with SCD's reapplied;Other (comment)(B heels floating via pillows) Nurse Communication: Mobility  status;Precautions;Weight bearing status;Other (comment)(pt's pain status; purewick removed; pt needing ice for ice pack) PT Visit Diagnosis: Muscle weakness (generalized) (M62.81);Difficulty in walking, not elsewhere classified (R26.2);Pain Pain - Right/Left: Right Pain - part of body: Hip     Time: [id number] PT Time Calculation (min) (ACUTE ONLY): 53 min  Charges:  $Gait Training: 8-22 mins $Therapeutic Exercise: 8-22 mins $Therapeutic Activity: 23-37 mins                     Nicoles Sedlacek, PT 05/13/19, 1:28 PM [phone]

## 2019-05-13 NOTE — TOC Progression Note (Signed)
Transition of Care [facility]) - Progression Note    Patient Details  Name: [name] MRN: [id number] Date of Birth: 22-Aug-1965  Transition of Care [facility]) CM/SW Contact  Su Hilt, RN Phone Number: 05/13/2019, 3:10 PM  Clinical Narrative:     [place] and started insurance auth, pending facility Fax number obtained and faxed information to [phone] ref number [id number]  Expected Discharge Plan: Skilled Nursing Facility Barriers to Discharge: Other (comment)(Lives with her sister who works 12 hrs; awaiting for PT to evaluate pt)  Expected Discharge Plan and Services Expected Discharge Plan: [place]   Discharge Planning Services: CM Consult   Living arrangements for the past 2 months: Single Family Home                                       Social Determinants of Health (SDOH) Interventions    Readmission Risk Interventions Readmission Risk Prevention Plan 12/16/2018  Transportation Screening Complete  Medication Review Press photographer) Complete  PCP or Specialist appointment within 3-5 days of discharge Not Complete  PCP/Specialist Appt Not Complete comments Not ready for [place] or [place] Not Complete  HRI or Home Care Consult Pt Refusal Comments NA  SW Recovery Care/Counseling Consult Not Complete  SW Consult Not Complete Comments NA  Palliative Care Screening Not Complete  Comments NA  [place] Not Complete  SNF Comments NA  Some recent data might be hidden

## 2019-05-13 NOTE — TOC Progression Note (Signed)
Transition of Care [facility]) - Progression Note    Patient Details  Name: Marianah Fagerberg MRN: [id number] Date of Birth: 09-29-1965  Transition of Care [facility]) CM/SW [place], RN Phone Number: 05/13/2019, 3:04 PM  Clinical Narrative:    Spoke with the patient to review the bed offers, She said she does not know anything about this area, She has always lived in [place] and [place] and would prefer if we would do a bed search in that area.  She does not want to go to SNF this far from home.  Sent bed search to [place] facilities per patient request   Expected Discharge Plan: Skilled Nursing Facility Barriers to Discharge: Other (comment)(Lives with her sister who works 12 hrs; awaiting for PT to evaluate pt)  Expected Discharge Plan and Services Expected Discharge Plan: [place]   Discharge Planning Services: CM Consult   Living arrangements for the past 2 months: Single Family Home                                       Social Determinants of Health (SDOH) Interventions    Readmission Risk Interventions Readmission Risk Prevention Plan 12/16/2018  Transportation Screening Complete  Medication Review Press photographer) Complete  PCP or Specialist appointment within 3-5 days of discharge Not Complete  PCP/Specialist Appt Not Complete comments Not ready for [place] or [place] Not Complete  HRI or Home Care Consult Pt Refusal Comments NA  SW Recovery Care/Counseling Consult Not Complete  SW Consult Not Complete Comments NA  Palliative Care Screening Not Complete  Comments NA  [place] Not Complete  SNF Comments NA  Some recent data might be hidden

## 2019-05-14 LAB — GLUCOSE, CAPILLARY
Glucose-Capillary: 204 mg/dL — ABNORMAL HIGH (ref 70–99)
Glucose-Capillary: 196 mg/dL — ABNORMAL HIGH (ref 70–99)
Glucose-Capillary: 189 mg/dL — ABNORMAL HIGH (ref 70–99)
Glucose-Capillary: 167 mg/dL — ABNORMAL HIGH (ref 70–99)

## 2019-05-14 LAB — CBC
HCT: 28.4 % — ABNORMAL LOW (ref 36.0–46.0)
Hemoglobin: 8.8 g/dL — ABNORMAL LOW (ref 12.0–15.0)
MCH: 24.7 pg — ABNORMAL LOW (ref 26.0–34.0)
MCHC: 31 g/dL (ref 30.0–36.0)
MCV: 79.8 fL — ABNORMAL LOW (ref 80.0–100.0)
Platelets: 294 10*3/uL (ref 150–400)
RBC: 3.56 MIL/uL — ABNORMAL LOW (ref 3.87–5.11)
RDW: 21.5 % — ABNORMAL HIGH (ref 11.5–15.5)
WBC: 9.3 10*3/uL (ref 4.0–10.5)
nRBC: 0 % (ref 0.0–0.2)

## 2019-05-14 LAB — BASIC METABOLIC PANEL
Anion gap: 9 (ref 5–15)
BUN: 31 mg/dL — ABNORMAL HIGH (ref 6–20)
CO2: 22 mmol/L (ref 22–32)
Calcium: 8.4 mg/dL — ABNORMAL LOW (ref 8.9–10.3)
Chloride: 102 mmol/L (ref 98–111)
Creatinine, Ser: 1.45 mg/dL — ABNORMAL HIGH (ref 0.44–1.00)
GFR calc Af Amer: 48 mL/min — ABNORMAL LOW (ref >60)
GFR calc non Af Amer: 42 mL/min — ABNORMAL LOW (ref >60)
Glucose, Bld: 238 mg/dL — ABNORMAL HIGH (ref 70–99)
Potassium: 4.6 mmol/L (ref 3.5–5.1)
Sodium: 133 mmol/L — ABNORMAL LOW (ref 135–145)

## 2019-05-14 LAB — SARS CORONAVIRUS 2 (TAT 6-24 HRS): SARS Coronavirus 2: NEGATIVE

## 2019-05-14 MED ORDER — GLYCERIN (LAXATIVE) 2.1 G RE SUPP
1.0000 | Freq: Every day | RECTAL | Status: DC | PRN
  Filled 2019-05-14: qty 1

## 2019-05-14 MED ORDER — POLYETHYLENE GLYCOL 3350 17 G PO PACK
17.0000 g | PACK | Freq: Two times a day (BID) | ORAL | Status: DC | PRN
  Administered 2019-05-14 – 2019-05-15 (×2): 17 g via ORAL
  Filled 2019-05-14 (×2): qty 1

## 2019-05-14 NOTE — TOC Progression Note (Signed)
Transition of Care [facility]) - Progression Note    Patient Details  Name: [name] MRN: [id number] Date of Birth: 10/03/1965  Transition of Care [facility]) CM/SW Contact  Shelbie Hutching, RN Phone Number: 05/14/2019, 10:56 AM  Clinical Narrative:     Bed offers presented to patient after search started in [place].  Patient has 4 bed offers.  Patient reports that she needs to talk with her family.  [facility] authorization has been started facility choice pending.  Patient will be retested for COVID today in anticipation for discharge today or tomorrow.   Expected Discharge Plan: Skilled Nursing Facility Barriers to Discharge: Other (comment)(Lives with her sister who works 12 hrs; awaiting for PT to evaluate pt)  Expected Discharge Plan and Services Expected Discharge Plan: [place]   Discharge Planning Services: CM Consult   Living arrangements for the past 2 months: Single Family Home                                       Social Determinants of Health (SDOH) Interventions    Readmission Risk Interventions Readmission Risk Prevention Plan 12/16/2018  Transportation Screening Complete  Medication Review Press photographer) Complete  PCP or Specialist appointment within 3-5 days of discharge Not Complete  PCP/Specialist Appt Not Complete comments Not ready for [place] or [place] Not Complete  HRI or Home Care Consult Pt Refusal Comments NA  SW Recovery Care/Counseling Consult Not Complete  SW Consult Not Complete Comments NA  Palliative Care Screening Not Complete  Comments NA  [place] Not Complete  SNF Comments NA  Some recent data might be hidden

## 2019-05-14 NOTE — Progress Notes (Signed)
PROGRESS NOTE  [name] [id number] DOB: 05/12/1966 DOA: 05/09/2019 PCP: [name], MD   LOS: 5 days   Brief Narrative / Interim history: 53 year old female with type 1 diabetes mellitus since age 73, coronary artery disease with CABG in 2019, neuropathy/gastroparesis, hypertension, depression, anxiety who came into the ED after right hip pain following a fall.  X-ray in the ED showed a fracture to the proximal right femoral shaft with varus angulation.  Chest x-ray did show some changes of venous congestion without focal consolidation or pleural effusion.  Orthopedic surgery was consulted and patient is status post IM nail repair on 11/15.  PT consulted postop and recommended SNF.  SW actively looking for a bed, Covid test needs repeat on 11/19  Subjective / 24h Interval events: Eating breakfast, no complaints.  Was able to stand yesterday and pivot and tells me she felt good doing that.  Pain is controlled.  No nausea or vomiting  Assessment & Plan: Principal Problem Closed fracture of the right proximal femur -After mechanical fall, no syncopal episodes.  Orthopedic surgery consulted and evaluated patient, she is status post intertrochanteric IM nail repair on 05/10/2019 by Dr. Kurtis Bushman -2D echo and cardiology consult were requested by admitting physician, 2D echo done 11/16 showed normal EF 55-60% -PT recommended SNF, patient agreeable, social worker consulted, will need a repeat Covid test which was ordered today 11/19  Active Problems Coronary artery disease status post CABG in 2019 -This is chronic, she appears stable from this standpoint.  EKG is without acute ischemic changes -Continue Lopressor -Prescribed statin on discharge  Fluid overload/pulmonary venous congestion -2D echo with normal EF -Received Lasix x2.  Currently appears euvolemic, on room air.  Euvolemic  Type 1 diabetes mellitus with history of gastroparesis -Persistently elevated CBGs on 11/18,  increase Lantus to 12 units twice daily, increase to resistant sliding scale, and afterwards CBG profile is much better -No symptoms of gastroparesis currently  CBG (last 3)  Recent Labs    05/13/19 1654 05/13/19 2105 05/14/19 0741  GLUCAP 164* 181* 204*    Iron deficiency anemia -Hemoglobin has remained stable, continue to monitor  Chronic kidney disease stage IIIa -Likely in the setting of existing diabetes, baseline creatinine 1.1-1.3, currently close to baseline.  She had hyperkalemia on 1 day that resolved with Lokelma  Leukocytosis -Likely reactive, monitor.  Stable, no fever  Hypertension -Continue Lopressor, blood pressure acceptable today  Anxiety/depression -Continue home medications  Scheduled Meds: . busPIRone  10 mg Oral TID  . docusate sodium  100 mg Oral BID  . enoxaparin (LOVENOX) injection  40 mg Subcutaneous Q24H  . insulin aspart  0-20 Units Subcutaneous TID WC  . insulin aspart  0-5 Units Subcutaneous QHS  . insulin glargine  12 Units Subcutaneous BID  . metoprolol tartrate  12.5 mg Oral BID  . pregabalin  150 mg Oral BID  . tranexamic acid (CYKLOKAPRON) topical -INTRAOP  2,000 mg Topical Once   Continuous Infusions: . lactated ringers 75 mL/hr at 05/10/19 2311   PRN Meds:.HYDROcodone-acetaminophen, LORazepam, morphine injection, ondansetron (ZOFRAN) IV, ondansetron **OR** ondansetron (ZOFRAN) IV, prochlorperazine, senna-docusate  DVT prophylaxis: SCDs Code Status: Full code Family Communication: Discussed with patient Disposition Plan: SNF 1 to 2 days when bed available and Covid results negative  Consultants:  Orthopedic surgery Cardiology  Procedures:  IM nail repair 05/10/2019 2D echo: IMPRESSIONS  1. Left ventricular ejection fraction, by visual estimation, is 55 to 60%. The left ventricle has normal function. There is no  left ventricular hypertrophy.  2. Global right ventricle has normal systolic function.The right ventricular size is  normal. No increase in right ventricular wall thickness.  3. The mitral valve is normal in structure. No evidence of mitral valve regurgitation. No evidence of mitral stenosis.  4. The tricuspid valve is normal in structure. Tricuspid valve regurgitation is not demonstrated.  5. The aortic valve is normal in structure. Aortic valve regurgitation is not visualized. No evidence of aortic valve sclerosis or stenosis.  6. The pulmonic valve was normal in structure. Pulmonic valve regurgitation is trivial.  7. TR signal is inadequate for assessing pulmonary artery systolic pressure.  8. The inferior vena cava is normal in size with greater than 50% respiratory variability, suggesting right atrial pressure of 3 mmHg.  Microbiology  SARS-CoV-2 11/14-pending SARS-CoV-2 11/15-pending MRSA PCR-negative  Antimicrobials: None   Objective: Vitals:   05/13/19 2001 05/14/19 0331 05/14/19 0500 05/14/19 0742  BP: (!) 120/54 (!) 141/59  (!) 125/47  Pulse: 87 87  86  Resp: 14 20  18   Temp: 97.7 F (36.5 C) 98.4 F (36.9 C)  98.9 F (37.2 C)  TempSrc: Oral Oral  Oral  SpO2: 100% 100%  93%  Weight:   76.8 kg   Height:        Intake/Output Summary (Last 24 hours) at 05/14/2019 1008 Last data filed at 05/13/2019 1845 Gross per 24 hour  Intake 480 ml  Output -  Net 480 ml   Filed Weights   05/11/19 0500 05/12/19 0434 05/14/19 0500  Weight: 74.8 kg 75.2 kg 76.8 kg    Examination:  Constitutional: Eating breakfast, no apparent distress Eyes: No scleral icterus ENMT: mmm Neck: normal, supple Respiratory: Clear bilaterally without wheezing or crackles, diminished at the bases Cardiovascular: Regular rate and rhythm, no murmurs, trace lower extremity edema Abdomen: Soft, NT, ND, positive bowel sounds Musculoskeletal: no clubbing / cyanosis.  Skin: No rashes seen Neurologic: No focal deficits, equal strength   Data Reviewed: I have independently reviewed following labs and imaging  studies   CBC: Recent Labs  Lab 05/09/19 2317  05/10/19 2207 05/11/19 0521 05/12/19 0406 05/13/19 0413 05/14/19 0924  WBC 15.9*   < > 11.9* 9.5 14.1* 12.1* 9.3  NEUTROABS 13.6*  --   --   --   --   --   --   HGB 8.6*   < > 9.8* 9.2* 9.5* 8.8* 8.8*  HCT 29.8*   < > 31.6* 29.9* 29.8* 28.7* 28.4*  MCV 77.8*   < > 79.2* 79.1* 78.4* 78.8* 79.8*  PLT 402*   < > 326 290 308 289 294   < > = values in this interval not displayed.   Basic Metabolic Panel: Recent Labs  Lab 05/10/19 0034 05/10/19 2207 05/11/19 0521 05/12/19 0406 05/13/19 0413 05/14/19 0924  NA 137  --  134* 131* 134* 133*  K 4.3  --  5.5* 5.4* 4.9 4.6  CL 104  --  103 101 103 102  CO2 25  --  23 22 25 22   GLUCOSE 153*  --  290* 280* 221* 238*  BUN 18  --  16 18 23* 31*  CREATININE 1.25* 1.17* 1.20* 1.39* 1.39* 1.45*  CALCIUM 8.8*  --  8.1* 8.2* 8.6* 8.4*   Liver Function Tests: Recent Labs  Lab 05/11/19 0521  AST 25  ALT 16  ALKPHOS 74  BILITOT 1.2  PROT 5.8*  ALBUMIN 2.6*   Coagulation Profile: No results for input(s): INR, PROTIME  in the last 168 hours. HbA1C: No results for input(s): HGBA1C in the last 72 hours. CBG: Recent Labs  Lab 05/13/19 0800 05/13/19 1224 05/13/19 1654 05/13/19 2105 05/14/19 0741  GLUCAP 251* 164* 164* 181* 204*    Recent Results (from the past 240 hour(s))  SARS CORONAVIRUS 2 (TAT 6-24 HRS) Nasopharyngeal Nasopharyngeal Swab     Status: None   Collection Time: 05/09/19 11:18 PM   Specimen: Nasopharyngeal Swab  Result Value Ref Range Status   SARS Coronavirus 2 NEGATIVE NEGATIVE Final    Comment: (NOTE) SARS-CoV-2 target nucleic acids are NOT DETECTED. The SARS-CoV-2 RNA is generally detectable in upper and lower respiratory specimens during the acute phase of infection. Negative results do not preclude SARS-CoV-2 infection, do not rule out co-infections with other pathogens, and should not be used as the sole basis for treatment or other patient management  decisions. Negative results must be combined with clinical observations, patient history, and epidemiological information. The expected result is Negative. Fact Sheet for Patients: SugarRoll.be Fact Sheet for Healthcare Providers: https://www.woods-mathews.com/ This test is not yet approved or cleared by the [place] FDA and  has been authorized for detection and/or diagnosis of SARS-CoV-2 by FDA under an Emergency Use Authorization (EUA). This EUA will remain  in effect (meaning this test can be used) for the duration of the COVID-19 declaration under Section 56 4(b)(1) of the Act, 21 U.S.C. section 360bbb-3(b)(1), unless the authorization is terminated or revoked sooner. Performed at [facility], [place] [address], [place], [place] [address]   Surgical pcr screen     Status: None   Collection Time: 05/10/19 12:41 AM   Specimen: Nasal Mucosa; Nasal Swab  Result Value Ref Range Status   MRSA, PCR NEGATIVE NEGATIVE Final   Staphylococcus aureus NEGATIVE NEGATIVE Final    Comment: (NOTE) The Xpert SA Assay (FDA approved for NASAL specimens in patients 36 years of age and older), is one component of a comprehensive surveillance program. It is not intended to diagnose infection nor to guide or monitor treatment. Performed at [facility], [address] [place], [place] [address]   SARS Coronavirus 2 by RT PCR (hospital order, performed in [facility] hospital lab) Nasopharyngeal Nasopharyngeal Swab     Status: None   Collection Time: 05/10/19 10:02 AM   Specimen: Nasopharyngeal Swab  Result Value Ref Range Status   SARS Coronavirus 2 NEGATIVE NEGATIVE Final    Comment: (NOTE) If result is NEGATIVE SARS-CoV-2 target nucleic acids are NOT DETECTED. The SARS-CoV-2 RNA is generally detectable in upper and lower  respiratory specimens during the acute phase of infection. The lowest  concentration of SARS-CoV-2 viral copies  this assay can detect is 250  copies / mL. A negative result does not preclude SARS-CoV-2 infection  and should not be used as the sole basis for treatment or other  patient management decisions.  A negative result may occur with  improper specimen collection / handling, submission of specimen other  than nasopharyngeal swab, presence of viral mutation(s) within the  areas targeted by this assay, and inadequate number of viral copies  (<250 copies / mL). A negative result must be combined with clinical  observations, patient history, and epidemiological information. If result is POSITIVE SARS-CoV-2 target nucleic acids are DETECTED. The SARS-CoV-2 RNA is generally detectable in upper and lower  respiratory specimens dur ing the acute phase of infection.  Positive  results are indicative of active infection with SARS-CoV-2.  Clinical  correlation  with patient history and other diagnostic information is  necessary to determine patient infection status.  Positive results do  not rule out bacterial infection or co-infection with other viruses. If result is PRESUMPTIVE POSTIVE SARS-CoV-2 nucleic acids MAY BE PRESENT.   A presumptive positive result was obtained on the submitted specimen  and confirmed on repeat testing.  While 2019 novel coronavirus  (SARS-CoV-2) nucleic acids may be present in the submitted sample  additional confirmatory testing may be necessary for epidemiological  and / or clinical management purposes  to differentiate between  SARS-CoV-2 and other Sarbecovirus currently known to infect humans.  If clinically indicated additional testing with an alternate test  methodology [id number]) is advised. The SARS-CoV-2 RNA is generally  detectable in upper and lower respiratory sp ecimens during the acute  phase of infection. The expected result is Negative. Fact Sheet for Patients:  StrictlyIdeas.no Fact Sheet for Healthcare Providers:  BankingDealers.co.za This test is not yet approved or cleared by the [place] FDA and has been authorized for detection and/or diagnosis of SARS-CoV-2 by FDA under an Emergency Use Authorization (EUA).  This EUA will remain in effect (meaning this test can be used) for the duration of the COVID-19 declaration under Section 564(b)(1) of the Act, 21 U.S.C. section 360bbb-3(b)(1), unless the authorization is terminated or revoked sooner. Performed at [facility], [address] [place], [place] [address]      Radiology Studies: No results found.  Marzetta Board, MD, PhD [facility]  Between 7 am - 7 pm I am available, please contact me via Amion or Securechat  Between 7 pm - 7 am I am not available, please contact night coverage MD/APP via Amion

## 2019-05-14 NOTE — Progress Notes (Signed)
Physical Therapy Treatment Patient Details Name: [name] MRN: [id number] DOB: August 19, 1965 Today's Date: 05/14/2019    History of Present Illness 53 y.o. female with medical history significant for CAD s/p CABG, type 1 diabetes with neuropathy and gastroparesis, hypertension, and depression with anxiety.  Pt fell off bench fracturing R hip, needing ORIF nailing 11/15 and was not seen POD1 secondary to elevated lab values and blood glucose issues.    PT Comments    Pt sitting in recliner upon PT arrival and reports 10/10 R hip pain and reports no recent pain meds (nurse arrived beginning of session and gave pt pain meds).  Pt agreeable to getting back to bed to improve positioning/comfort.  Pt initially very hesitant to try to stand with 1 person (d/t pt reporting feeling more secure with 2nd assist) but with encouragement pt able to stand with 1 assist and with extra time take steps from recliner to bed with RW and 1 assist; 2 assist sit to semi-supine with repositioning to improve comfort (pt reporting improved comfort/pain end of session resting in bed).  Deferred further activity to allow pt to rest and continue to improve pain/comfort.  Will continue to focus on strengthening and progressive functional mobility per pt tolerance.    Follow Up Recommendations  SNF     Equipment Recommendations  Rolling walker with 5" wheels;3in1 (PT)    Recommendations for Other Services OT consult     Precautions / Restrictions Precautions Precautions: Fall Restrictions Weight Bearing Restrictions: Yes RLE Weight Bearing: Weight bearing as tolerated    Mobility  Bed Mobility Overal bed mobility: Needs Assistance Bed Mobility: Sit to Supine     Sit to supine: +2 for physical assistance   General bed mobility comments: 2 assist sit to semi-supine (assist for trunk and B LE's) d/t pt's concern for R LE pain  Transfers Overall transfer level: Needs assistance Equipment used: Rolling  walker (2 wheeled) Transfers: Sit to/from Stand Sit to Stand: Min assist;Mod assist         General transfer comment: vc's and tactile cues for UE/LE placement; assist to initiate and come to full stand; vc's for overall transfer technique; x1 trial standing from recliner and controlled descent sitting onto bed  Ambulation/Gait Ambulation/Gait assistance: Min assist Gait Distance (Feet): 3 Feet(recliner to bed) Assistive device: Rolling walker (2 wheeled)   Gait velocity: significantly decreased   General Gait Details: increased time to take steps recliner to bed; antalgic; decreased stance time R LE   Stairs             Wheelchair Mobility    Modified Rankin (Stroke Patients Only)       Balance Overall balance assessment: Needs assistance Sitting-balance support: No upper extremity supported;Feet supported Sitting balance-Leahy Scale: Good Sitting balance - Comments: steady sitting reaching within BOS   Standing balance support: Bilateral upper extremity supported Standing balance-Leahy Scale: Poor Standing balance comment: pt requiring B UE support on RW for static standing balance                            Cognition Arousal/Alertness: Awake/alert Behavior During Therapy: Anxious Overall Cognitive Status: Within Functional Limits for tasks assessed                                 General Comments: Pt requiring extra time for activities d/t R hip/thigh pain  Exercises    General Comments General comments (skin integrity, edema, etc.): dressings intact R hip/thigh. Pt agreeable to getting back to bed d/t being uncomfortable in chair.      Pertinent Vitals/Pain Pain Assessment: 0-10 Pain Score: 10-Worst pain ever Pain Location: R hip Pain Descriptors / Indicators: Aching;Guarding;Tender;Sore;Constant;Grimacing Pain Intervention(s): Limited activity within patient's tolerance;Monitored during session;Repositioned;RN gave  pain meds during session;Ice applied  Vitals (HR and O2 on room air) stable and WFL throughout treatment session.    Home Living                      Prior Function            PT Goals (current goals can now be found in the care plan section) Acute Rehab PT Goals Patient Stated Goal: control pain and get back to walking PT Goal Formulation: With patient Time For Goal Achievement: 05/26/19 Potential to Achieve Goals: Fair Progress towards PT goals: Progressing toward goals    Frequency    BID      PT Plan Current plan remains appropriate    Co-evaluation              AM-PAC PT "6 Clicks" Mobility   Outcome Measure  Help needed turning from your back to your side while in a flat bed without using bedrails?: Total Help needed moving from lying on your back to sitting on the side of a flat bed without using bedrails?: Total Help needed moving to and from a bed to a chair (including a wheelchair)?: A Little Help needed standing up from a chair using your arms (e.g., wheelchair or bedside chair)?: A Lot Help needed to walk in hospital room?: A Little Help needed climbing 3-5 steps with a railing? : Total 6 Click Score: 11    End of Session Equipment Utilized During Treatment: Gait belt Activity Tolerance: Patient limited by pain Patient left: in bed;with call bell/phone within reach;with bed alarm set;with SCD's reapplied;Other (comment)(B heels floating via pillows; ice pack to R hip) Nurse Communication: Mobility status;Precautions;Weight bearing status(via white board) PT Visit Diagnosis: Muscle weakness (generalized) (M62.81);Difficulty in walking, not elsewhere classified (R26.2);Pain Pain - Right/Left: Right Pain - part of body: Hip     Time: [id number] PT Time Calculation (min) (ACUTE ONLY): 30 min  Charges:  $Therapeutic Activity: 23-37 mins                     Leitha Bleak, PT 05/14/19, 2:21 PM

## 2019-05-14 NOTE — Progress Notes (Signed)
Physical Therapy Treatment Patient Details Name: [name] MRN: [id number] DOB: [birth year] Today's Date: 05/14/2019    History of Present Illness 53 y.o. female with medical history significant for CAD s/p CABG, type 1 diabetes with neuropathy and gastroparesis, hypertension, and depression with anxiety.  Pt fell off bench fracturing R hip, needing ORIF nailing 11/15 and was not seen POD1 secondary to elevated lab values and blood glucose issues.    PT Comments    Pt resting in bed upon PT arrival and pt appearing hesitant to move either LE initially in bed (d/t concerns for R LE pain) but with encouragement and extra time pt able to participate in therapy session's activities.  Able to progress to standing with min to mod assist x2 up to RW and then ambulate 14 feet with RW--initially min assist x2 but progressed to min assist x1 with chair follow.  Pt reporting 10/10 R hip pain post ambulation (nurse notified for pain meds; ice pack placed to pt's R hip; vc's given for relaxation breathing technique; and TV turned on with pt's choice of programming to attempt to redirect pt's focus on pain until pt able to receive pain medication).  Will continue to focus on strengthening and progressive functional mobility per pt tolerance.   Follow Up Recommendations  SNF     Equipment Recommendations  Rolling walker with 5" wheels;3in1 (PT)    Recommendations for Other Services OT consult     Precautions / Restrictions Precautions Precautions: Fall Restrictions Weight Bearing Restrictions: Yes RLE Weight Bearing: Weight bearing as tolerated    Mobility  Bed Mobility Overal bed mobility: Needs Assistance Bed Mobility: Supine to Sit     Supine to sit: +2 for physical assistance;HOB elevated     General bed mobility comments: 2 assist semi-supine to sit (assist for trunk and B LE's) d/t pt's concern for R LE pain  Transfers Overall transfer level: Needs assistance Equipment used:  Rolling walker (2 wheeled) Transfers: Sit to/from Stand Sit to Stand: Min assist;Mod assist;+2 physical assistance(mildly elevated bed height)         General transfer comment: vc's and tactile cues for UE/LE placement; assist to initiate and come to full stand; vc's for overall transfer technique; x1 trial standing from bed; controlled descent (with extra time to sit down d/t R hip pain) into recliner  Ambulation/Gait Ambulation/Gait assistance: Min guard;Min assist;+2 physical assistance Gait Distance (Feet): 14 Feet Assistive device: Rolling walker (2 wheeled)   Gait velocity: significantly decreased   General Gait Details: pt initially with difficulty taking any steps (assist required for advancing B LE's first 6 feet) but with extra time and cueing and effort, pt able to take steps on own; decreased stance time R LE; decreased B LE step length; vc's for R foot flat during R LE stance phase   Stairs             Wheelchair Mobility    Modified Rankin (Stroke Patients Only)       Balance Overall balance assessment: Needs assistance Sitting-balance support: No upper extremity supported;Feet supported Sitting balance-Leahy Scale: Good Sitting balance - Comments: steady sitting reaching within BOS   Standing balance support: Bilateral upper extremity supported Standing balance-Leahy Scale: Poor Standing balance comment: pt requiring heavy B UE support on RW for static standing balance                            Cognition Arousal/Alertness: Awake/alert Behavior During  Therapy: Anxious Overall Cognitive Status: Within Functional Limits for tasks assessed                                 General Comments: Pt requiring encouragement to participate in therapy session's activities (d/t pt's concerns of pain) requiring extra time for all session's activities.      Exercises Total Joint Exercises Short Arc Quad: AAROM;Strengthening;Right;10  reps;Supine Heel Slides: AAROM;Strengthening;Right;10 reps;Supine(decreased ROM d/t R hip/thigh pain) Hip ABduction/ADduction: AAROM;Strengthening;Right;10 reps;Supine(decreased ROM d/t R hip/thigh pain)    General Comments General comments (skin integrity, edema, etc.): dressings intact R hip/thigh.  Nursing cleared pt for participation in physical therapy.  Pt agreeable to PT session.      Pertinent Vitals/Pain Pain Assessment: 0-10 Pain Score: 10-Worst pain ever Pain Descriptors / Indicators: Aching;Guarding;Tender;Sore;Constant;Grimacing Pain Intervention(s): Limited activity within patient's tolerance;Monitored during session;Premedicated before session;Repositioned;Ice applied  Vitals (HR and O2 on room air) stable and WFL throughout treatment session.    Home Living                      Prior Function            PT Goals (current goals can now be found in the care plan section) Acute Rehab PT Goals Patient Stated Goal: control pain and get back to walking PT Goal Formulation: With patient Time For Goal Achievement: 05/26/19 Potential to Achieve Goals: Fair Progress towards PT goals: Progressing toward goals    Frequency    BID      PT Plan Current plan remains appropriate    Co-evaluation              AM-PAC PT "6 Clicks" Mobility   Outcome Measure  Help needed turning from your back to your side while in a flat bed without using bedrails?: Total Help needed moving from lying on your back to sitting on the side of a flat bed without using bedrails?: Total Help needed moving to and from a bed to a chair (including a wheelchair)?: Total Help needed standing up from a chair using your arms (e.g., wheelchair or bedside chair)?: Total Help needed to walk in hospital room?: A Little Help needed climbing 3-5 steps with a railing? : Total 6 Click Score: 8    End of Session Equipment Utilized During Treatment: Gait belt Activity Tolerance: Patient  limited by pain Patient left: in chair;with call bell/phone within reach;with chair alarm set;with SCD's reapplied;Other (comment)(B heels floating via pillow; ice pack to R hip/thigh) Nurse Communication: Mobility status;Precautions;Weight bearing status;Other (comment);Patient requests pain meds(pt's pain status) PT Visit Diagnosis: Muscle weakness (generalized) (M62.81);Difficulty in walking, not elsewhere classified (R26.2);Pain Pain - Right/Left: Right Pain - part of body: Hip     Time: [id number] PT Time Calculation (min) (ACUTE ONLY): 54 min  Charges:  $Gait Training: 8-22 mins $Therapeutic Exercise: 8-22 mins $Therapeutic Activity: 23-37 mins                     Leitha Bleak, PT 05/14/19, 11:04 AM [phone]

## 2019-05-14 NOTE — Progress Notes (Signed)
Subjective:  Patient reports pain as mild.    Objective:   VITALS:   Vitals:   05/14/19 0331 05/14/19 0500 05/14/19 0742 05/14/19 1635  BP: (!) 141/59  (!) 125/47 (!) 125/49  Pulse: 87  86 82  Resp: 20  18 18   Temp: 98.4 F (36.9 C)  98.9 F (37.2 C) 98.7 F (37.1 C)  TempSrc: Oral  Oral Oral  SpO2: 100%  93% 99%  Weight:  76.8 kg    Height:        PHYSICAL EXAM:  Sensation intact distally Dorsiflexion/Plantar flexion intact Incision: dressing C/D/I Compartment soft  LABS  Results for orders placed or performed during the hospital encounter of 05/09/19 (from the past 24 hour(s))  Glucose, capillary     Status: Abnormal   Collection Time: 05/13/19  4:54 PM  Result Value Ref Range   Glucose-Capillary 164 (H) 70 - 99 mg/dL  Glucose, capillary     Status: Abnormal   Collection Time: 05/13/19  9:05 PM  Result Value Ref Range   Glucose-Capillary 181 (H) 70 - 99 mg/dL  Glucose, capillary     Status: Abnormal   Collection Time: 05/14/19  7:41 AM  Result Value Ref Range   Glucose-Capillary 204 (H) 70 - 99 mg/dL  CBC     Status: Abnormal   Collection Time: 05/14/19  9:24 AM  Result Value Ref Range   WBC 9.3 4.0 - 10.5 K/uL   RBC 3.56 (L) 3.87 - 5.11 MIL/uL   Hemoglobin 8.8 (L) 12.0 - 15.0 g/dL   HCT 28.4 (L) 36.0 - 46.0 %   MCV 79.8 (L) 80.0 - 100.0 fL   MCH 24.7 (L) 26.0 - 34.0 pg   MCHC 31.0 30.0 - 36.0 g/dL   RDW 21.5 (H) 11.5 - 15.5 %   Platelets 294 150 - 400 K/uL   nRBC 0.0 0.0 - 0.2 %  Basic metabolic panel     Status: Abnormal   Collection Time: 05/14/19  9:24 AM  Result Value Ref Range   Sodium 133 (L) 135 - 145 mmol/L   Potassium 4.6 3.5 - 5.1 mmol/L   Chloride 102 98 - 111 mmol/L   CO2 22 22 - 32 mmol/L   Glucose, Bld 238 (H) 70 - 99 mg/dL   BUN 31 (H) 6 - 20 mg/dL   Creatinine, Ser 1.45 (H) 0.44 - 1.00 mg/dL   Calcium 8.4 (L) 8.9 - 10.3 mg/dL   GFR calc non Af Amer 42 (L) >60 mL/min   GFR calc Af Amer 48 (L) >60 mL/min   Anion gap 9 5 - 15   SARS CORONAVIRUS 2 (TAT 6-24 HRS) Nasopharyngeal Nasopharyngeal Swab     Status: None   Collection Time: 05/14/19 10:47 AM   Specimen: Nasopharyngeal Swab  Result Value Ref Range   SARS Coronavirus 2 NEGATIVE NEGATIVE  Glucose, capillary     Status: Abnormal   Collection Time: 05/14/19 12:02 PM  Result Value Ref Range   Glucose-Capillary 196 (H) 70 - 99 mg/dL  Glucose, capillary     Status: Abnormal   Collection Time: 05/14/19  4:35 PM  Result Value Ref Range   Glucose-Capillary 189 (H) 70 - 99 mg/dL    No results found.  Assessment/Plan: 4 Days Post-Op   Principal Problem:   Closed fracture of proximal end of femur, right, initial encounter ([place]) Active Problems:   Anxiety and depression   Essential hypertension   Type 1 diabetes mellitus with hyperlipidemia ([place])  CAD (coronary artery disease), native coronary artery   Up with therapy Discharge to SNF  Follow up in 10 to 12 days for staple removal   Lovell Sheehan , MD 05/14/2019, 4:50 PM.jr

## 2019-05-14 NOTE — Care Management Important Message (Signed)
Important Message  Patient Details  Name: [name] MRN: [id number] Date of Birth: [birth year]   Medicare Important Message Given:  Yes     Juliann Pulse A Xavian Hardcastle 05/14/2019, 11:05 AM

## 2019-05-15 DIAGNOSIS — N1831 Chronic kidney disease, stage 3a: Secondary | ICD-10-CM

## 2019-05-15 LAB — CBC
HCT: 27.2 % — ABNORMAL LOW (ref 36.0–46.0)
Hemoglobin: 8.5 g/dL — ABNORMAL LOW (ref 12.0–15.0)
MCH: 24.7 pg — ABNORMAL LOW (ref 26.0–34.0)
MCHC: 31.3 g/dL (ref 30.0–36.0)
MCV: 79.1 fL — ABNORMAL LOW (ref 80.0–100.0)
Platelets: 356 10*3/uL (ref 150–400)
RBC: 3.44 MIL/uL — ABNORMAL LOW (ref 3.87–5.11)
RDW: 21.1 % — ABNORMAL HIGH (ref 11.5–15.5)
WBC: 7.6 10*3/uL (ref 4.0–10.5)
nRBC: 0 % (ref 0.0–0.2)

## 2019-05-15 LAB — GLUCOSE, CAPILLARY
Glucose-Capillary: 242 mg/dL — ABNORMAL HIGH (ref 70–99)
Glucose-Capillary: 204 mg/dL — ABNORMAL HIGH (ref 70–99)
Glucose-Capillary: 134 mg/dL — ABNORMAL HIGH (ref 70–99)

## 2019-05-15 LAB — BASIC METABOLIC PANEL
Anion gap: 10 (ref 5–15)
BUN: 32 mg/dL — ABNORMAL HIGH (ref 6–20)
CO2: 22 mmol/L (ref 22–32)
Calcium: 8.8 mg/dL — ABNORMAL LOW (ref 8.9–10.3)
Chloride: 104 mmol/L (ref 98–111)
Creatinine, Ser: 1.33 mg/dL — ABNORMAL HIGH (ref 0.44–1.00)
GFR calc Af Amer: 54 mL/min — ABNORMAL LOW (ref >60)
GFR calc non Af Amer: 46 mL/min — ABNORMAL LOW (ref >60)
Glucose, Bld: 236 mg/dL — ABNORMAL HIGH (ref 70–99)
Potassium: 5.8 mmol/L — ABNORMAL HIGH (ref 3.5–5.1)
Sodium: 136 mmol/L (ref 135–145)

## 2019-05-15 LAB — POTASSIUM: Potassium: 4.5 mmol/L (ref 3.5–5.1)

## 2019-05-15 MED ORDER — POLYETHYLENE GLYCOL 3350 17 G PO PACK: 17.0000 g | PACK | Freq: Two times a day (BID) | ORAL | 0 refills | Status: DC | PRN

## 2019-05-15 MED ORDER — INSULIN ASPART 100 UNIT/ML ~~LOC~~ SOLN
3.0000 [IU] | Freq: Three times a day (TID) | SUBCUTANEOUS | Status: DC
  Administered 2019-05-15: 3 [IU] via SUBCUTANEOUS
  Filled 2019-05-15: qty 1

## 2019-05-15 MED ORDER — INSULIN ASPART 100 UNIT/ML ~~LOC~~ SOLN: 3.0000 [IU] | Freq: Three times a day (TID) | SUBCUTANEOUS | Status: DC

## 2019-05-15 MED ORDER — INSULIN GLARGINE 100 UNIT/ML ~~LOC~~ SOLN: 12.0000 [IU] | Freq: Two times a day (BID) | SUBCUTANEOUS | 11 refills | Status: DC

## 2019-05-15 MED ORDER — ENOXAPARIN SODIUM 40 MG/0.4ML ~~LOC~~ SOLN: 40.0000 mg | SUBCUTANEOUS | 0 refills | Status: DC

## 2019-05-15 MED ORDER — LACTULOSE 10 GM/15ML PO SOLN: 10.0000 g | Freq: Once | ORAL | Status: DC

## 2019-05-15 MED ORDER — HYDROCODONE-ACETAMINOPHEN 5-325 MG PO TABS: 1.0000 | ORAL_TABLET | Freq: Four times a day (QID) | ORAL | 0 refills | Status: AC | PRN

## 2019-05-15 NOTE — TOC Transition Note (Signed)
Transition of Care [facility]) - CM/SW Discharge Note   Patient Details  Name: Michellie Auxier MRN: [id number] Date of Birth: [birth year]  [facility]) CM/SW Contact:  Shelbie Hutching, RN Phone Number: 05/15/2019, 4:36 PM   Clinical Narrative:    Patient will discharge to [facility] this afternoon.  Patient is going to room 304, bedside RN will call report to [phone].  RNCM attempted to call patient's sister Zane Herald but there was no answer.  Patient will transport via [employer].    Final next level of care: Skilled Nursing Facility Barriers to Discharge: Barriers Resolved   Patient Goals and CMS Choice Patient states their goals for this hospitalization and ongoing recovery are:: To get better CMS Medicare.gov Compare Post Acute Care list provided to:: Patient Choice offered to / list presented to : Patient  Discharge Placement              Patient chooses bed at: [facility] [place] Patient to be transferred to facility by: [facility] Name of family member notified: sister Zane Herald Patient and family notified of of transfer: 05/15/19  Discharge Plan and Services   Discharge Planning Services: CM Consult                                 Social Determinants of Health ([place]) Interventions     Readmission Risk Interventions Readmission Risk Prevention Plan 12/16/2018  Transportation Screening Complete  Medication Review Press photographer) Complete  PCP or Specialist appointment within 3-5 days of discharge Not Complete  PCP/Specialist Appt Not Complete comments Not ready for DC  [place] or [place] Not Complete  HRI or Home Care Consult Pt Refusal Comments NA  SW Recovery Care/Counseling Consult Not Complete  SW Consult Not Complete Comments NA  Palliative Care Screening Not Complete  Comments NA  [place] Not Complete  SNF Comments NA  Some recent data might be hidden

## 2019-05-15 NOTE — TOC Progression Note (Signed)
Transition of Care [facility]) - Progression Note    Patient Details  Name: [name] MRN: [id number] Date of Birth: Nov 14, 1965  [facility]) CM/SW Contact  Shelbie Hutching, RN Phone Number: 05/15/2019, 9:10 AM  Clinical Narrative:    After discussing bed offers with her sister the patient decided on accepting offer from [facility], unfortunately when [facility] called [facility] they no longer had a bed available.  RNCM is reaching out to other facilities in [place] area.     Expected Discharge Plan: Skilled Nursing Facility Barriers to Discharge: Other (comment)(Lives with her sister who works 12 hrs; awaiting for PT to evaluate pt)  Expected Discharge Plan and Services Expected Discharge Plan: [place]   Discharge Planning Services: CM Consult   Living arrangements for the past 2 months: Single Family Home                                       Social Determinants of Health (SDOH) Interventions    Readmission Risk Interventions Readmission Risk Prevention Plan 12/16/2018  Transportation Screening Complete  Medication Review Press photographer) Complete  PCP or Specialist appointment within 3-5 days of discharge Not Complete  PCP/Specialist Appt Not Complete comments Not ready for [place] or [place] Not Complete  HRI or Home Care Consult Pt Refusal Comments NA  SW Recovery Care/Counseling Consult Not Complete  SW Consult Not Complete Comments NA  Palliative Care Screening Not Complete  Comments NA  [place] Not Complete  SNF Comments NA  Some recent data might be hidden

## 2019-05-15 NOTE — Progress Notes (Signed)
Physical Therapy Treatment Patient Details Name: [name] MRN: [id number] DOB: 1966/01/25 Today's Date: 05/15/2019    History of Present Illness 53 y.o. female with medical history significant for CAD s/p CABG, type 1 diabetes with neuropathy and gastroparesis, hypertension, and depression with anxiety.  Pt fell off bench fracturing R hip, needing ORIF nailing 11/15 and was not seen POD1 secondary to elevated lab values and blood glucose issues.    PT Comments    Pt laying in bed upon PT arrival and reporting 9/10 R hip/thigh pain but also needing to use BSC as quick as possible.  One assist semi-supine to sit and 2 assist to stand and pivot with RW bed to [facility].  Nurse contacted regarding pt's pain and request for pain meds and nurse came in to give pt pain meds.  After pt finished toileting, pt requiring and requesting assist for post toileting hygiene and then pt able to stand and walk 5 feet bed to recliner with RW and 2 assist (see below for additional details).  End of session, pt repositioned in recliner to improve comfort and ice pack placed to pt's R hip to assist with pain control.  Will continue to focus on strengthening and progressive functional mobility per pt's tolerance during hospital stay.   Follow Up Recommendations  SNF     Equipment Recommendations  Rolling walker with 5" wheels;3in1 (PT)    Recommendations for Other Services OT consult     Precautions / Restrictions Precautions Precautions: Fall Restrictions Weight Bearing Restrictions: Yes RLE Weight Bearing: Weight bearing as tolerated    Mobility  Bed Mobility Overal bed mobility: Needs Assistance Bed Mobility: Supine to Sit     Supine to sit: Max assist;HOB elevated     General bed mobility comments: assist for trunk and B LE's; vc's for use of bed rail; increased time to perform d/t R hip/thigh pain  Transfers Overall transfer level: Needs assistance Equipment used: Rolling walker (2  wheeled) Transfers: Sit to/from [facility] Sit to Stand: Min guard;Min assist;+2 physical assistance;From elevated surface(bed height mildly elevated) Stand pivot transfers: Min guard;Min assist;+2 physical assistance       General transfer comment: occasional vc's and tactile cues for UE/LE placement; assist to initiate and come to full stand; x1 trial standing from bed and x1 trial standing from [facility] with RW use  Ambulation/Gait Ambulation/Gait assistance: Min guard;Min assist Gait Distance (Feet): 5 Feet(BSC to recliner) Assistive device: Rolling walker (2 wheeled)   Gait velocity: significantly decreased   General Gait Details: antalgic; decreased stance time R LE; initial assist to advance R LE (d/t R hip/thigh pain) but then pt able to advance R LE on own with increased time/effort; limited distance d/t R hip/thigh pain   Stairs             Wheelchair Mobility    Modified Rankin (Stroke Patients Only)       Balance Overall balance assessment: Needs assistance Sitting-balance support: No upper extremity supported;Feet supported Sitting balance-Leahy Scale: Good Sitting balance - Comments: steady sitting reaching within BOS   Standing balance support: Bilateral upper extremity supported Standing balance-Leahy Scale: Poor Standing balance comment: pt requiring B UE support on RW for static standing balance                            Cognition Arousal/Alertness: Awake/alert Behavior During Therapy: Anxious Overall Cognitive Status: Within Functional Limits for tasks assessed  General Comments: Pt requiring extra time for activities d/t R hip/thigh pain      Exercises      General Comments General comments (skin integrity, edema, etc.): minimal drainage noted R thigh/hip dressings.  Nursing cleared pt for participation in physical therapy.  Pt agreeable to PT session but needing to  toilet first.      Pertinent Vitals/Pain Pain Score: 9  Pain Location: R hip Pain Descriptors / Indicators: Aching;Guarding;Tender;Sore;Constant;Grimacing Pain Intervention(s): Limited activity within patient's tolerance;Monitored during session;Repositioned;Patient requesting pain meds-RN notified;RN gave pain meds during session;Ice applied  Vitals (HR and O2 on room air) stable and WFL throughout treatment session.    Home Living                      Prior Function            PT Goals (current goals can now be found in the care plan section) Acute Rehab PT Goals Patient Stated Goal: control pain and get back to walking PT Goal Formulation: With patient Time For Goal Achievement: 05/26/19 Potential to Achieve Goals: Fair Progress towards PT goals: Progressing toward goals    Frequency    BID      PT Plan Current plan remains appropriate    Co-evaluation              AM-PAC PT "6 Clicks" Mobility   Outcome Measure  Help needed turning from your back to your side while in a flat bed without using bedrails?: A Lot Help needed moving from lying on your back to sitting on the side of a flat bed without using bedrails?: A Lot Help needed moving to and from a bed to a chair (including a wheelchair)?: A Lot Help needed standing up from a chair using your arms (e.g., wheelchair or bedside chair)?: A Lot Help needed to walk in hospital room?: A Lot Help needed climbing 3-5 steps with a railing? : Total 6 Click Score: 11    End of Session Equipment Utilized During Treatment: Gait belt Activity Tolerance: Patient limited by pain Patient left: in chair;with call bell/phone within reach;with chair alarm set;with SCD's reapplied;Other (comment)(B heels floating via pillow) Nurse Communication: Mobility status;Precautions;Weight bearing status;Patient requests pain meds PT Visit Diagnosis: Muscle weakness (generalized) (M62.81);Difficulty in walking, not elsewhere  classified (R26.2);Pain Pain - Right/Left: Right Pain - part of body: Hip     Time: [id number] PT Time Calculation (min) (ACUTE ONLY): 30 min  Charges:  $Therapeutic Activity: 23-37 mins                     Leitha Bleak, PT 05/15/19, 1:35 PM [phone]

## 2019-05-15 NOTE — Discharge Summary (Signed)
Discharge Summary  [name] GYI:[id number] DOB: Jun 27, 1965  PCP: [name], MD  Admit date: 05/09/2019 Discharge date: 05/15/2019  Time spent: 25 minutes  Recommendations for Outpatient Follow-up:  1. New medication: Lovenox 40 mg subcu daily starting 11/21 until 11/29 2. New medication: Percocet 5/325 every 6 hours as needed for pain 3. Medication change: Lantus increased from 8 units to 12 units twice a day 4. Patient will follow up with Dr. Harlow Mares, orthopedic surgery in approximately 10 days for staple removal and wound check 5. Patient is being discharged to collapse skilled nursing facility  Discharge Diagnoses:  Active Hospital Problems   Diagnosis Date Noted   Closed fracture of proximal end of femur, right, initial encounter ([place]) 05/09/2019   CAD (coronary artery disease), native coronary artery 01/11/2018   Type 1 diabetes mellitus with hyperlipidemia ([place]) 03/18/2016   Essential hypertension 01/24/2016   Anxiety and depression 05/18/2015    Resolved Hospital Problems  No resolved problems to display.    Discharge Condition: Improved, being discharged to skilled nursing facility  Diet recommendation: Carb modified, low-sodium  Vitals:   05/15/19 0015 05/15/19 1001  BP: (!) 127/48 (!) 146/59  Pulse: 83 85  Resp: 18 16  Temp: 98.7 F (37.1 C) 99 F (37.2 C)  SpO2: 94% 97%    History of present illness:  53 year old female with past medical history of type 1 diabetes mellitus with secondary neuropathy, CAD status post CABG as well as hypertension and anxiety presented to the emergency room on 11/14 after sustaining a mechanical fall and found to have a proximal right femoral shaft fracture with varus angulation.  Patient was admitted to the hospitalist service and orthopedics were consulted.  Hospital Course:  Principal Problem:   Closed fracture of proximal end of femur, right, initial encounter [facility]): Patient was seen by orthopedic surgery  and taken for IM nail on 11/15.  Postop did well.  Up with physical therapy.  Placed on daily Lovenox subcu until 11/29.  As needed Percocet for pain.  Patient accepted for skilled nursing on 11/20.  Patient will follow-up with orthopedic surgery, Dr. Harlow Mares, in approximately 10 days for staple removal and wound check. Active Problems:   Anxiety and depression: Continued on her home medications.    Essential hypertension: Continued on her home occasions.    Type 1 diabetes mellitus with hyperlipidemia ([place]), secondary gastroparesis and stage IIIa chronic kidney disease: A1c done on previous admission noted to be elevated.  Blood sugars here remained elevated so Lantus increased to 12 units twice a day which she will continue after discharge.    CAD (coronary artery disease), native coronary artery  Obesity: Patient meets criteria with BMI greater than 30  Procedures:  Status post IM nail done 11/15  Consultations:  Dr. Harlow Mares, orthopedic surgery  Discharge Exam: BP (!) 146/59 (BP Location: Left Arm)    Pulse 85    Temp 99 F (37.2 C) (Oral)    Resp 16    Ht 5' 1" (1.549 m)    Wt 74.3 kg    SpO2 97%    BMI 30.93 kg/m   General: Alert and oriented x3, mild distress from pain Cardiovascular: Regular rate and rhythm, S1-S2 Respiratory: Clear to auscultation bilaterally  Discharge Instructions You were cared for by a hospitalist during your hospital stay. If you have any questions about your discharge medications or the care you received while you were in the hospital after you are discharged, you can call the unit  and asked to speak with the hospitalist on call if the hospitalist that took care of you is not available. Once you are discharged, your primary care physician will handle any further medical issues. Please note that NO REFILLS for any discharge medications will be authorized once you are discharged, as it is imperative that you return to your primary care physician (or establish  a relationship with a primary care physician if you do not have one) for your aftercare needs so that they can reassess your need for medications and monitor your lab values.  Discharge Instructions    Diet - low sodium heart healthy   Complete by: As directed    Increase activity slowly   Complete by: As directed      Allergies as of 05/15/2019      Reactions   Metoclopramide Other (See Comments)   Dystonia, muscle rigidity.  Patient states she is only allergic to the "pill form, not the IV" Slurred speech, tongue swelling   Anesthetics, Amide Nausea And Vomiting   Chlorhexidine    Penicillins Diarrhea, Nausea And Vomiting, Other (See Comments)   Has patient had a PCN reaction causing immediate rash, facial/tongue/throat swelling, SOB or lightheadedness with hypotension: Yes Has patient had a PCN reaction causing severe rash involving mucus membranes or skin necrosis: No Has patient had a PCN reaction that required hospitalization No Has patient had a PCN reaction occurring within the last 10 years: Yes  If all of the above answers are "NO", then may proceed with Cephalosporin use.   Buprenorphine Hcl Rash   Encainide Nausea And Vomiting      Medication List    STOP taking these medications   Lantus SoloStar 100 UNIT/ML Solostar Pen Generic drug: Insulin Glargine Replaced by: insulin glargine 100 UNIT/ML injection     TAKE these medications   Accu-Chek Aviva Plus test strip Generic drug: glucose blood Use as instructed to test blood sugar 4 times daily Dx E10.65 Notes to patient: Not used this hospitalization   ammonium lactate 12 % cream Commonly known as: AMLACTIN Apply topically as needed for dry skin. Notes to patient: Not given this hospitalizatin   aspirin EC 81 MG tablet Take 1 tablet (81 mg total) by mouth daily. Notes to patient: Not given this hospitalization   busPIRone 10 MG tablet Commonly known as: BUSPAR Take 1 tablet (10 mg total) by mouth 3  (three) times daily.   docusate sodium 100 MG capsule Commonly known as: Colace Take 1 capsule (100 mg total) by mouth daily as needed for mild constipation.   enoxaparin 40 MG/0.4ML injection Commonly known as: LOVENOX Inject 0.4 mLs (40 mg total) into the skin daily for 9 days. Start taking on: May 16, 2019   furosemide 40 MG tablet Commonly known as: LASIX Take 40 mg by mouth daily.   HYDROcodone-acetaminophen 5-325 MG tablet Commonly known as: NORCO/VICODIN Take 1 tablet by mouth every 6 (six) hours as needed for up to 5 days for moderate pain (Hold & Call MD if SBP<90, HR<65, RR<10, O2<90, or altered mental status.).   insulin aspart 100 UNIT/ML injection Commonly known as: novoLOG Inject 10 units under the skin in the morning, and 8-10 units at supper.   insulin glargine 100 UNIT/ML injection Commonly known as: LANTUS Inject 0.12 mLs (12 Units total) into the skin 2 (two) times daily. Replaces: Lantus SoloStar 100 UNIT/ML Solostar Pen   Insulin Syringe-Needle U-100 31G X 5/16" 0.5 ML Misc Commonly known as: BD Insulin  Syringe Ultrafine 1 each by Does not apply route 4 (four) times daily.   loperamide 2 MG capsule Commonly known as: IMODIUM Take 1 capsule (2 mg total) by mouth 3 (three) times daily as needed for diarrhea or loose stools. Notes to patient: Not given this hospital stay   losartan 25 MG tablet Commonly known as: COZAAR Take 1 tablet (25 mg total) by mouth daily. Notes to patient: Not given this hospitalization   metoprolol tartrate 25 MG tablet Commonly known as: LOPRESSOR Take 0.5 tablets (12.5 mg total) by mouth 2 (two) times daily.   OneTouch Delica Plus [id number] Misc 1 Units by Does not apply route as directed. Notes to patient: Not given this hospitalization   OneTouch Verio w/Device Kit Use as directed Notes to patient: Not given this hospitalization   pantoprazole 40 MG tablet Commonly known as: PROTONIX Take 1 tablet (40 mg  total) by mouth daily. Notes to patient: Not given this hospitalization   polyethylene glycol 17 g packet Commonly known as: MIRALAX / GLYCOLAX Take 17 g by mouth 2 (two) times daily as needed for moderate constipation.   pregabalin 150 MG capsule Commonly known as: Lyrica Take 1 capsule (150 mg total) by mouth 2 (two) times daily.   pyridostigmine 60 MG tablet Commonly known as: MESTINON Take 60 mg by mouth 3 (three) times daily. Notes to patient: Not given this hospitalization      Allergies  Allergen Reactions   Metoclopramide Other (See Comments)    Dystonia, muscle rigidity.  Patient states she is only allergic to the "pill form, not the IV" Slurred speech, tongue swelling   Anesthetics, Amide Nausea And Vomiting   Chlorhexidine    Penicillins Diarrhea, Nausea And Vomiting and Other (See Comments)    Has patient had a PCN reaction causing immediate rash, facial/tongue/throat swelling, SOB or lightheadedness with hypotension: Yes Has patient had a PCN reaction causing severe rash involving mucus membranes or skin necrosis: No Has patient had a PCN reaction that required hospitalization No Has patient had a PCN reaction occurring within the last 10 years: Yes  If all of the above answers are "NO", then may proceed with Cephalosporin use.    Buprenorphine Hcl Rash   Encainide Nausea And Vomiting      The results of significant diagnostics from this hospitalization (including imaging, microbiology, ancillary and laboratory) are listed below for reference.    Significant Diagnostic Studies: Dg Chest 1 View  Result Date: 05/09/2019 CLINICAL DATA:  Recent fall with chest pain, initial encounter EXAM: CHEST  1 VIEW COMPARISON:  04/27/2019 FINDINGS: Cardiac shadow is stable. Postoperative changes are seen. Diffuse vascular congestion and interstitial edema is noted consistent with early CHF. No pleural effusion is noted. No focal infiltrate is seen. No bony abnormality  is noted. IMPRESSION: Changes consistent with early CHF. Electronically Signed   By: Inez Catalina M.D.   On: 05/09/2019 21:38   Dg Pelvis 1-2 Views  Result Date: 05/09/2019 CLINICAL DATA:  Fall.  Right leg pain EXAM: PELVIS - 1-2 VIEW COMPARISON:  None. FINDINGS: Fracture through the proximal right femoral shaft just below the intertrochanteric region. Mild varus angulation. No subluxation or dislocation. Hip joints and SI joints are symmetric. IMPRESSION: Fracture through the proximal right femoral shaft with varus angulation. Electronically Signed   By: Rolm Baptise M.D.   On: 05/09/2019 21:35   Dg C-arm 1-60 Min  Result Date: 05/10/2019 CLINICAL DATA:  IM nail right femur EXAM: OPERATIVE right HIP (WITH  PELVIS IF PERFORMED) 15 VIEWS TECHNIQUE: Fluoroscopic spot image(s) were submitted for interpretation post-operatively. COMPARISON:  05/09/2019 FINDINGS: Fifteen low resolution intraoperative spot views of the right hip and femur. Total fluoroscopy time was 1 minutes 8 seconds. The images demonstrate a right subtrochanteric fracture. Subsequent intramedullary rod and screw fixation of the right femur. IMPRESSION: Intraoperative fluoroscopic assistance provided during surgical fixation of right proximal femur fracture Electronically Signed   By: Donavan Foil M.D.   On: 05/10/2019 18:12   Dg Hip Operative Unilat With Pelvis Right  Result Date: 05/10/2019 CLINICAL DATA:  IM nail right femur EXAM: OPERATIVE right HIP (WITH PELVIS IF PERFORMED) 15 VIEWS TECHNIQUE: Fluoroscopic spot image(s) were submitted for interpretation post-operatively. COMPARISON:  05/09/2019 FINDINGS: Fifteen low resolution intraoperative spot views of the right hip and femur. Total fluoroscopy time was 1 minutes 8 seconds. The images demonstrate a right subtrochanteric fracture. Subsequent intramedullary rod and screw fixation of the right femur. IMPRESSION: Intraoperative fluoroscopic assistance provided during surgical  fixation of right proximal femur fracture Electronically Signed   By: Donavan Foil M.D.   On: 05/10/2019 18:12   Dg Femur, Min 2 Views Right  Result Date: 05/09/2019 CLINICAL DATA:  Fall EXAM: RIGHT FEMUR 2 VIEWS COMPARISON:  Pelvis study today FINDINGS: Fracture through the proximal right femoral shaft just below the intertrochanteric region. Mild varus angulation. No subluxation or dislocation. Right hip joint and knee joint unremarkable. IMPRESSION: Fracture through the proximal right femoral shaft with varus angulation. Electronically Signed   By: Rolm Baptise M.D.   On: 05/09/2019 21:36    Microbiology: Recent Results (from the past 240 hour(s))  SARS CORONAVIRUS 2 (TAT 6-24 HRS) Nasopharyngeal Nasopharyngeal Swab     Status: None   Collection Time: 05/09/19 11:18 PM   Specimen: Nasopharyngeal Swab  Result Value Ref Range Status   SARS Coronavirus 2 NEGATIVE NEGATIVE Final    Comment: (NOTE) SARS-CoV-2 target nucleic acids are NOT DETECTED. The SARS-CoV-2 RNA is generally detectable in upper and lower respiratory specimens during the acute phase of infection. Negative results do not preclude SARS-CoV-2 infection, do not rule out co-infections with other pathogens, and should not be used as the sole basis for treatment or other patient management decisions. Negative results must be combined with clinical observations, patient history, and epidemiological information. The expected result is Negative. Fact Sheet for Patients: SugarRoll.be Fact Sheet for Healthcare Providers: https://www.woods-mathews.com/ This test is not yet approved or cleared by the [place] FDA and  has been authorized for detection and/or diagnosis of SARS-CoV-2 by FDA under an Emergency Use Authorization (EUA). This EUA will remain  in effect (meaning this test can be used) for the duration of the COVID-19 declaration under Section 56 4(b)(1) of the Act, 21  U.S.C. section 360bbb-3(b)(1), unless the authorization is terminated or revoked sooner. Performed at [facility], [place] [address], [place], [place] [address]   Surgical pcr screen     Status: None   Collection Time: 05/10/19 12:41 AM   Specimen: Nasal Mucosa; Nasal Swab  Result Value Ref Range Status   MRSA, PCR NEGATIVE NEGATIVE Final   Staphylococcus aureus NEGATIVE NEGATIVE Final    Comment: (NOTE) The Xpert SA Assay (FDA approved for NASAL specimens in patients 43 years of age and older), is one component of a comprehensive surveillance program. It is not intended to diagnose infection nor to guide or monitor treatment. Performed at [facility], [address] [place], [place] [address]   SARS Coronavirus  2 by RT PCR (hospital order, performed in [facility] hospital lab) Nasopharyngeal Nasopharyngeal Swab     Status: None   Collection Time: 05/10/19 10:02 AM   Specimen: Nasopharyngeal Swab  Result Value Ref Range Status   SARS Coronavirus 2 NEGATIVE NEGATIVE Final    Comment: (NOTE) If result is NEGATIVE SARS-CoV-2 target nucleic acids are NOT DETECTED. The SARS-CoV-2 RNA is generally detectable in upper and lower  respiratory specimens during the acute phase of infection. The lowest  concentration of SARS-CoV-2 viral copies this assay can detect is 250  copies / mL. A negative result does not preclude SARS-CoV-2 infection  and should not be used as the sole basis for treatment or other  patient management decisions.  A negative result may occur with  improper specimen collection / handling, submission of specimen other  than nasopharyngeal swab, presence of viral mutation(s) within the  areas targeted by this assay, and inadequate number of viral copies  (<250 copies / mL). A negative result must be combined with clinical  observations, patient history, and epidemiological information. If result is POSITIVE SARS-CoV-2 target nucleic acids are  DETECTED. The SARS-CoV-2 RNA is generally detectable in upper and lower  respiratory specimens dur ing the acute phase of infection.  Positive  results are indicative of active infection with SARS-CoV-2.  Clinical  correlation with patient history and other diagnostic information is  necessary to determine patient infection status.  Positive results do  not rule out bacterial infection or co-infection with other viruses. If result is PRESUMPTIVE POSTIVE SARS-CoV-2 nucleic acids MAY BE PRESENT.   A presumptive positive result was obtained on the submitted specimen  and confirmed on repeat testing.  While 2019 novel coronavirus  (SARS-CoV-2) nucleic acids may be present in the submitted sample  additional confirmatory testing may be necessary for epidemiological  and / or clinical management purposes  to differentiate between  SARS-CoV-2 and other Sarbecovirus currently known to infect humans.  If clinically indicated additional testing with an alternate test  methodology [phone]) is advised. The SARS-CoV-2 RNA is generally  detectable in upper and lower respiratory sp ecimens during the acute  phase of infection. The expected result is Negative. Fact Sheet for Patients:  StrictlyIdeas.no Fact Sheet for Healthcare Providers: BankingDealers.co.za This test is not yet approved or cleared by the [place] FDA and has been authorized for detection and/or diagnosis of SARS-CoV-2 by FDA under an Emergency Use Authorization (EUA).  This EUA will remain in effect (meaning this test can be used) for the duration of the COVID-19 declaration under Section 564(b)(1) of the Act, 21 U.S.C. section 360bbb-3(b)(1), unless the authorization is terminated or revoked sooner. Performed at [facility], [place], [place] [address]   SARS CORONAVIRUS 2 (TAT 6-24 HRS) Nasopharyngeal Nasopharyngeal Swab     Status: None    Collection Time: 05/14/19 10:47 AM   Specimen: Nasopharyngeal Swab  Result Value Ref Range Status   SARS Coronavirus 2 NEGATIVE NEGATIVE Final    Comment: (NOTE) SARS-CoV-2 target nucleic acids are NOT DETECTED. The SARS-CoV-2 RNA is generally detectable in upper and lower respiratory specimens during the acute phase of infection. Negative results do not preclude SARS-CoV-2 infection, do not rule out co-infections with other pathogens, and should not be used as the sole basis for treatment or other patient management decisions. Negative results must be combined with clinical observations, patient history, and epidemiological information. The expected result is Negative. Fact Sheet for Patients: SugarRoll.be Fact Sheet  for Healthcare Providers: https://www.woods-mathews.com/ This test is not yet approved or cleared by the [place] and  has been authorized for detection and/or diagnosis of SARS-CoV-2 by FDA under an Emergency Use Authorization (EUA). This EUA will remain  in effect (meaning this test can be used) for the duration of the COVID-19 declaration under Section 56 4(b)(1) of the Act, 21 U.S.C. section 360bbb-3(b)(1), unless the authorization is terminated or revoked sooner. Performed at [facility], [place] [address], [place], [place] [address]      Labs: Basic Metabolic Panel: Recent Labs  Lab 05/11/19 0521 05/12/19 0406 05/13/19 0413 05/14/19 0924 05/15/19 0344 05/15/19 0832  NA 134* 131* 134* 133* 136  --   K 5.5* 5.4* 4.9 4.6 5.8* 4.5  CL 103 101 103 102 104  --   CO2 _0 --   GLUCOSE 290* 280* 221* 238* 236*  --   BUN 16 18 23* 31* 32*  --   CREATININE 1.20* 1.39* 1.39* 1.45* 1.33*  --   CALCIUM 8.1* 8.2* 8.6* 8.4* 8.8*  --    Liver Function Tests: Recent Labs  Lab 05/11/19 0521  AST 25  ALT 16  ALKPHOS 74  BILITOT 1.2  PROT 5.8*  ALBUMIN 2.6*   No results for input(s): LIPASE,  AMYLASE in the last 168 hours. No results for input(s): AMMONIA in the last 168 hours. CBC: Recent Labs  Lab 05/09/19 2317  05/11/19 0521 05/12/19 0406 05/13/19 0413 05/14/19 0924 05/15/19 0344  WBC 15.9*   < > 9.5 14.1* 12.1* 9.3 7.6  NEUTROABS 13.6*  --   --   --   --   --   --   HGB 8.6*   < > 9.2* 9.5* 8.8* 8.8* 8.5*  HCT 29.8*   < > 29.9* 29.8* 28.7* 28.4* 27.2*  MCV 77.8*   < > 79.1* 78.4* 78.8* 79.8* 79.1*  PLT 402*   < > 290 308 289 294 356   < > = values in this interval not displayed.   Cardiac Enzymes: No results for input(s): CKTOTAL, CKMB, CKMBINDEX, TROPONINI in the last 168 hours. BNP: BNP (last 3 results) Recent Labs    05/09/19 2317  BNP 123.0*    ProBNP (last 3 results) No results for input(s): PROBNP in the last 8760 hours.  CBG: Recent Labs  Lab 05/14/19 1202 05/14/19 1635 05/14/19 2058 05/15/19 0810 05/15/19 1148  GLUCAP 196* 189* 167* 242* 204*       Signed:  Annita Brod, MD [facility] 05/15/2019, 12:47 PM

## 2019-05-15 NOTE — Progress Notes (Signed)
Called report to [facility] [place]

## 2019-05-15 NOTE — TOC Progression Note (Signed)
Transition of Care [facility]) - Progression Note    Patient Details  Name: [name] MRN: [id number] Date of Birth: 09/08/1965  [facility]) CM/SW Contact  Shelbie Hutching, RN Phone Number: 05/15/2019, 12:34 PM  Clinical Narrative:    [facility] has offered a bed and patient has accepted.  Authorization from [place]- approved for 5 days starting today 05/15/19- next review 11/24.  Care coordinator is Precious Reel, fax updates to [phone].   Expected Discharge Plan: Skilled Nursing Facility Barriers to Discharge: Other (comment)(Lives with her sister who works 12 hrs; awaiting for PT to evaluate pt)  Expected Discharge Plan and Services Expected Discharge Plan: [place]   Discharge Planning Services: CM Consult   Living arrangements for the past 2 months: Single Family Home Expected Discharge Date: 05/15/19                                     Social Determinants of Health (SDOH) Interventions    Readmission Risk Interventions Readmission Risk Prevention Plan 12/16/2018  Transportation Screening Complete  Medication Review Press photographer) Complete  PCP or Specialist appointment within 3-5 days of discharge Not Complete  PCP/Specialist Appt Not Complete comments Not ready for [place] or [place] Not Complete  HRI or Home Care Consult Pt Refusal Comments NA  SW Recovery Care/Counseling Consult Not Complete  SW Consult Not Complete Comments NA  Palliative Care Screening Not Complete  Comments NA  [place] Not Complete  SNF Comments NA  Some recent data might be hidden

## 2019-05-19 ENCOUNTER — Inpatient Hospital Stay: Payer: Medicare HMO | Admitting: Family Medicine

## 2019-06-08 ENCOUNTER — Ambulatory Visit: Payer: Medicare HMO | Admitting: Family Medicine

## 2019-06-08 ENCOUNTER — Telehealth: Payer: Self-pay | Admitting: Family Medicine

## 2019-06-08 NOTE — Telephone Encounter (Signed)
Patient has came home from rehab and that need verbal orders for pt 2 times a week for 4 weeks [name] would like [place] to clall him back at [phone]. Also the patient is in a lot of pain and he wants [place] to call her back .

## 2019-06-09 ENCOUNTER — Telehealth: Payer: Self-pay | Admitting: Family Medicine

## 2019-06-09 NOTE — Telephone Encounter (Signed)
Lennette Bihari was called and given verbal orders for PT as listed.

## 2019-06-09 NOTE — Telephone Encounter (Signed)
Zofran requested but not current listed. Will send to PCP for approval.

## 2019-06-09 NOTE — Telephone Encounter (Signed)
1) Medication(s) Requested (by name): [email or web address]   2) Pharmacy of Choice: [facility] [place]   3) Special Requests:   Approved medications will be sent to the pharmacy, we will reach out if there is an issue.  Requests made after 3pm may not be addressed until the following business day!  If a patient is unsure of the name of the medication(s) please note and ask patient to call back when they are able to provide all info, do not send to responsible party until all information is available!

## 2019-06-10 MED ORDER — ONDANSETRON HCL 4 MG PO TABS: 4.0000 mg | ORAL_TABLET | Freq: Three times a day (TID) | ORAL | 0 refills | Status: DC | PRN

## 2019-06-10 NOTE — Addendum Note (Signed)
Addended by: [name] on: 06/10/2019 01:28 PM   Modules accepted: Orders

## 2019-06-10 NOTE — Telephone Encounter (Signed)
Prescription for Zofran sent to pharmacy.  

## 2019-06-15 ENCOUNTER — Other Ambulatory Visit: Payer: Self-pay | Admitting: Family Medicine

## 2019-06-15 DIAGNOSIS — R600 Localized edema: Secondary | ICD-10-CM

## 2019-06-16 ENCOUNTER — Other Ambulatory Visit: Payer: Medicare HMO

## 2019-06-18 MED ORDER — ASPIRIN 81 MG PO TBEC
81.00 | DELAYED_RELEASE_TABLET | ORAL | Status: DC
Start: 2019-06-19 — End: 2019-06-18

## 2019-06-18 MED ORDER — GLUCAGON (RDNA) 1 MG IJ KIT
1.00 | PACK | INTRAMUSCULAR | Status: DC
Start: ? — End: 2019-06-18

## 2019-06-18 MED ORDER — ACETAMINOPHEN 325 MG PO TABS
650.00 | ORAL_TABLET | ORAL | Status: DC
Start: ? — End: 2019-06-18

## 2019-06-18 MED ORDER — PANTOPRAZOLE SODIUM 40 MG PO TBEC
40.00 | DELAYED_RELEASE_TABLET | ORAL | Status: DC
Start: 2019-06-18 — End: 2019-06-18

## 2019-06-18 MED ORDER — ONDANSETRON HCL 4 MG/2ML IJ SOLN
4.00 | INTRAMUSCULAR | Status: DC
Start: ? — End: 2019-06-18

## 2019-06-18 MED ORDER — BUSPIRONE HCL 10 MG PO TABS
10.00 | ORAL_TABLET | ORAL | Status: DC
Start: 2019-06-18 — End: 2019-06-18

## 2019-06-18 MED ORDER — INSULIN LISPRO 100 UNIT/ML ~~LOC~~ SOLN
2.00 | SUBCUTANEOUS | Status: DC
Start: ? — End: 2019-06-18

## 2019-06-18 MED ORDER — HYDROCODONE-ACETAMINOPHEN 5-325 MG PO TABS
2.00 | ORAL_TABLET | ORAL | Status: DC
Start: ? — End: 2019-06-18

## 2019-06-18 MED ORDER — PREGABALIN 75 MG PO CAPS
150.00 | ORAL_CAPSULE | ORAL | Status: DC
Start: 2019-06-18 — End: 2019-06-18

## 2019-06-18 MED ORDER — MIRTAZAPINE 15 MG PO TABS
30.00 | ORAL_TABLET | ORAL | Status: DC
Start: 2019-06-18 — End: 2019-06-18

## 2019-06-18 MED ORDER — GLUCOSE 40 % PO GEL
15.00 | ORAL | Status: DC
Start: ? — End: 2019-06-18

## 2019-06-18 MED ORDER — POLYETHYLENE GLYCOL 3350 17 GM/SCOOP PO POWD
17.00 | ORAL | Status: DC
Start: ? — End: 2019-06-18

## 2019-06-18 MED ORDER — LOSARTAN POTASSIUM 50 MG PO TABS
100.00 | ORAL_TABLET | ORAL | Status: DC
Start: 2019-06-19 — End: 2019-06-18

## 2019-06-18 MED ORDER — DEXTROSE 10 % IV SOLN
125.00 | INTRAVENOUS | Status: DC
Start: ? — End: 2019-06-18

## 2019-06-18 MED ORDER — INSULIN GLARGINE 100 UNIT/ML ~~LOC~~ SOLN
10.00 | SUBCUTANEOUS | Status: DC
Start: 2019-06-19 — End: 2019-06-18

## 2019-06-22 ENCOUNTER — Telehealth: Payer: Self-pay | Admitting: Nutrition

## 2019-06-22 ENCOUNTER — Telehealth: Payer: Self-pay | Admitting: Family Medicine

## 2019-06-22 NOTE — Telephone Encounter (Signed)
Tera Partridge, LPN called to request verbal order for mutual patient, [name] skilled nursing visit.  1 time per week  For 6 weeks for disease management and wound care. Provide wound care to right heel. Paint heel with betadine.   Please advise.

## 2019-06-22 NOTE — Telephone Encounter (Signed)
Terri called asking for verbal orders for 1 week 6 teaching wound care right hill suspected deep tissue injury paint with betadine daily patient care to complete in abscess of nurse.

## 2019-06-22 NOTE — Telephone Encounter (Signed)
Message left on my machine that she has been in the hospital and has a fractured femur.  Would like to set up an appointment to start on her V-Go I tried calling, but no answer, and not able to leave a message.   After 3 more attempts we scheduled for training for next week

## 2019-06-22 NOTE — Telephone Encounter (Signed)
I do not want her to be on a V-go pump because her total basal insulin is only 16 units

## 2019-06-23 NOTE — Telephone Encounter (Signed)
Okay to give verbal order.

## 2019-06-24 ENCOUNTER — Ambulatory Visit: Payer: Medicare HMO | Admitting: Endocrinology

## 2019-06-30 ENCOUNTER — Ambulatory Visit: Payer: Medicare HMO | Attending: Family Medicine | Admitting: Family Medicine

## 2019-06-30 ENCOUNTER — Encounter: Payer: Medicare HMO | Admitting: Nutrition

## 2019-06-30 ENCOUNTER — Other Ambulatory Visit: Payer: Self-pay | Admitting: Endocrinology

## 2019-06-30 ENCOUNTER — Other Ambulatory Visit: Payer: Self-pay

## 2019-06-30 DIAGNOSIS — S728X1D Other fracture of right femur, subsequent encounter for closed fracture with routine healing: Secondary | ICD-10-CM

## 2019-06-30 DIAGNOSIS — N3000 Acute cystitis without hematuria: Secondary | ICD-10-CM

## 2019-06-30 DIAGNOSIS — E1065 Type 1 diabetes mellitus with hyperglycemia: Secondary | ICD-10-CM

## 2019-06-30 MED ORDER — NITROFURANTOIN MONOHYD MACRO 100 MG PO CAPS: 100.0000 mg | ORAL_CAPSULE | Freq: Two times a day (BID) | ORAL | 0 refills | Status: DC

## 2019-06-30 MED ORDER — INSULIN ASPART 100 UNIT/ML ~~LOC~~ SOLN: SUBCUTANEOUS | 0 refills | Status: DC

## 2019-06-30 NOTE — Progress Notes (Signed)
Virtual Visit via Telephone Note  I connected with [name], on 06/30/2019 at 4:41 PM by telephone due to the COVID-19 pandemic and verified that I am speaking with the correct person using two identifiers.   Consent: I discussed the limitations, risks, security and privacy concerns of performing an evaluation and management service by telephone and the availability of in person appointments. I also discussed with the patient that there may be a patient responsible charge related to this service. The patient expressed understanding and agreed to proceed.   Location of Patient: Home  Location of Provider: Clinic   Persons participating in Telemedicine visit: Annaleah Christabel Camire Farrington-CMA Dr. Margarita Rana     History of Present Illness: [name]  is a 54 year old female with a history of uncontrolled type 1 diabetes mellitus (A1c 8.8), diabetic neuropathy, diabetic gastroparesis, hypertension, anemia, anxiety and depression, multiple ED visits and hospitalizations for diabetic gastroparesis, CAD s/p CABG x3 (LIMA to LAD, SVG to PDA, SVG to OM) in 12/2017 here for a follow up visit.   Since her last visit she has sustained a R femoral fracture (s/p intramedullary nail in 04/2019)  after a fall and is ambulating with a walker. Followed by Ortho in [place] and is undergoing PT On 06/16/19 she was seen at [facility] for gastroparesis but reports no additional episodes since then. Her Diabetes is managed by Endocrine - Dr Dwyane Dee and her appointment for today was postponed till next week.  She complains of a cloudy urine with a foul odor and states this is typical of her UTI. She denies presence of frequency but has lower abdominal pain.  Also denies presence of fever. She endorses compliance with all her medications.  Past Medical History:  Diagnosis Date  . AKI (acute kidney injury) ([place])   . Anemia, iron deficiency   . Anxiety and depression 05/18/2015  .  CAD (coronary artery disease), native coronary artery 01/11/2018   s/p CABG - Non-STEMI with severe three-vessel disease noted July 2019  . Depression   . Diabetic gastroparesis ([place])   . Diabetic neuropathy, type I diabetes mellitus ([place]) 05/18/2015  . DKA, type 1 ([place]) 03/18/2016  . Essential hypertension   . Gastroparesis   . GERD (gastroesophageal reflux disease)   . HLD (hyperlipidemia)   . MDD (major depressive disorder), single episode, severe , no psychosis ([place])   . Tardive dyskinesia   . Vitamin B12 deficiency 08/16/2015   Allergies  Allergen Reactions  . Metoclopramide Other (See Comments)    Dystonia, muscle rigidity.  Patient states she is only allergic to the "pill form, not the IV" Slurred speech, tongue swelling  . Anesthetics, Amide Nausea And Vomiting  . Chlorhexidine   . Penicillins Diarrhea, Nausea And Vomiting and Other (See Comments)    Has patient had a PCN reaction causing immediate rash, facial/tongue/throat swelling, SOB or lightheadedness with hypotension: Yes Has patient had a PCN reaction causing severe rash involving mucus membranes or skin necrosis: No Has patient had a PCN reaction that required hospitalization No Has patient had a PCN reaction occurring within the last 10 years: Yes  If all of the above answers are "NO", then may proceed with Cephalosporin use.   . Buprenorphine Hcl Rash  . Encainide Nausea And Vomiting    Current Outpatient Medications on File Prior to Visit  Medication Sig Dispense Refill  . ammonium lactate (AMLACTIN) 12 % cream Apply topically as needed for dry skin. 385 g 0  . aspirin  EC 81 MG tablet Take 1 tablet (81 mg total) by mouth daily.    . Blood Glucose Monitoring Suppl (ONETOUCH VERIO) w/Device KIT Use as directed 1 kit 0  . busPIRone (BUSPAR) 10 MG tablet Take 1 tablet (10 mg total) by mouth 3 (three) times daily. 90 tablet 3  . docusate sodium (COLACE) 100 MG capsule Take 1 capsule (100 mg total) by mouth daily as  needed for mild constipation. 10 capsule 0  . furosemide (LASIX) 40 MG tablet TAKE 1 TABLET BY MOUTH DAILY AS NEEDED FOR FLUID OR EDEMA 30 tablet 0  . glucose blood (ACCU-CHEK AVIVA PLUS) test strip Use as instructed to test blood sugar 4 times daily Dx E10.65 150 each 2  . insulin glargine (LANTUS) 100 UNIT/ML injection Inject 0.12 mLs (12 Units total) into the skin 2 (two) times daily. 10 mL 11  . Insulin Syringe-Needle U-100 (BD INSULIN SYRINGE ULTRAFINE) 31G X 5/16" 0.5 ML MISC 1 each by Does not apply route 4 (four) times daily. 120 each 5  . Lancets (ONETOUCH DELICA PLUS [id number]) MISC 1 Units by Does not apply route as directed. 100 each 11  . loperamide (IMODIUM) 2 MG capsule Take 1 capsule (2 mg total) by mouth 3 (three) times daily as needed for diarrhea or loose stools. 30 capsule 0  . losartan (COZAAR) 25 MG tablet Take 1 tablet (25 mg total) by mouth daily. 90 tablet 3  . metoprolol tartrate (LOPRESSOR) 25 MG tablet Take 0.5 tablets (12.5 mg total) by mouth 2 (two) times daily. 90 tablet 3  . ondansetron (ZOFRAN) 4 MG tablet Take 1 tablet (4 mg total) by mouth every 8 (eight) hours as needed for nausea or vomiting. 20 tablet 0  . pantoprazole (PROTONIX) 40 MG tablet Take 1 tablet (40 mg total) by mouth daily. 30 tablet 3  . polyethylene glycol (MIRALAX / GLYCOLAX) 17 g packet Take 17 g by mouth 2 (two) times daily as needed for moderate constipation. 14 each 0  . pregabalin (LYRICA) 150 MG capsule Take 1 capsule (150 mg total) by mouth 2 (two) times daily. 180 capsule 1  . pyridostigmine (MESTINON) 60 MG tablet Take 60 mg by mouth 3 (three) times daily.    Marland Kitchen enoxaparin (LOVENOX) 40 MG/0.4ML injection Inject 0.4 mLs (40 mg total) into the skin daily for 9 days. 3.6 mL 0   No current facility-administered medications on file prior to visit.    Observations/Objective: Awake, alert, oriented x3 Not in acute distress  Lab Results  Component Value Date   HGBA1C 8.8 (A) 04/15/2019     Assessment and Plan: 1. Acute cystitis without hematuria We'll treat presumptively given underlying comorbid conditions - nitrofurantoin, macrocrystal-monohydrate, (MACROBID) 100 MG capsule; Take 1 capsule (100 mg total) by mouth 2 (two) times daily.  Dispense: 10 capsule; Refill: 0  2. Uncontrolled type 1 diabetes mellitus with hyperglycemia (HCC) Uncontrolled with A1c of 8.8 She is followed by endocrine Keep appointment  3. Other closed fracture of right femur with routine healing, unspecified portion of femur, subsequent encounter Status post intramedullary nail insertion Continue PT Fall precautions   Follow Up Instructions: 3 months   I discussed the assessment and treatment plan with the patient. The patient was provided an opportunity to ask questions and all were answered. The patient agreed with the plan and demonstrated an understanding of the instructions.   The patient was advised to call back or seek an in-person evaluation if the symptoms worsen or if the  condition fails to improve as anticipated.     I provided 18 minutes total of non-face-to-face time during this encounter including median intraservice time, reviewing previous notes, labs, imaging, medications, management and patient verbalized understanding.     [name], MD, FAAFP. [facility] [place], [place]   06/30/2019, 4:41 PM

## 2019-06-30 NOTE — Progress Notes (Signed)
Patient has been called and DOB has been verified. Patient has been screened and transferred to PCP to start phone visit.  May have a uti, cloudy urine.

## 2019-07-01 ENCOUNTER — Encounter: Payer: Self-pay | Admitting: Family Medicine

## 2019-07-07 ENCOUNTER — Encounter: Payer: Medicare HMO | Attending: Endocrinology | Admitting: Nutrition

## 2019-07-07 DIAGNOSIS — E1040 Type 1 diabetes mellitus with diabetic neuropathy, unspecified: Secondary | ICD-10-CM | POA: Insufficient documentation

## 2019-07-07 DIAGNOSIS — E1069 Type 1 diabetes mellitus with other specified complication: Secondary | ICD-10-CM

## 2019-07-08 ENCOUNTER — Ambulatory Visit: Payer: Medicare HMO | Admitting: Nutrition

## 2019-07-08 ENCOUNTER — Other Ambulatory Visit: Payer: Self-pay

## 2019-07-08 ENCOUNTER — Telehealth: Payer: Self-pay | Admitting: Nutrition

## 2019-07-08 ENCOUNTER — Encounter: Payer: Self-pay | Admitting: Endocrinology

## 2019-07-08 ENCOUNTER — Ambulatory Visit (INDEPENDENT_AMBULATORY_CARE_PROVIDER_SITE_OTHER): Payer: Medicare HMO | Admitting: Endocrinology

## 2019-07-08 VITALS — BP 122/68 | HR 91 | Ht 61.0 in | Wt 153.6 lb

## 2019-07-08 DIAGNOSIS — E1065 Type 1 diabetes mellitus with hyperglycemia: Secondary | ICD-10-CM | POA: Diagnosis not present

## 2019-07-08 NOTE — Progress Notes (Signed)
Patient is here with her sister to learn how to use the OmniPod.  She was first taught how how use the [facility] sensor.  We discussed the difference between sensor readings and blood sugar readings.  We inserted a sensor into her left arm and she was shown how to scan this. She was encouraged to scan this before all meals, and at bedtime.  She agreed to do this.   Settings were put into the OmniPod pump per Dr. Ronnie Derby orders:  Basal rate: 0.5u//hr, I/C : 1 with written directions to take 8u  AcB, 3u AcL, and 6u acS   ISF: 50, target:120 with correction over 120.   She took no Lantus last night, so was shown how to fill a pod using Humalog insulin.  She did this with much help from myself and her sister.  It was applied to her Upper left abdomen, without any difficulty.  She started this at 1:30PM with no IOB.  Her sensor reading was 129.  No correction dose was given.  She was strongly encouraged to read over the resource manual tonight to learn the basis of this pump.  She agreed to do this. She was shown how to give a bolus, and re demonstrated how to do this X5.  Sister reported good understanding of this and said she would be with her today.   They had no final questions.

## 2019-07-08 NOTE — Telephone Encounter (Signed)
Patient reported that she had no difficulty giving bolus before lunch.  Said she only took 2.6u because blood sugar was 114.  She has not taken a supper bolus, because she did not feel like she was hungry.  She had no questions for me at this time.

## 2019-07-08 NOTE — Patient Instructions (Addendum)
Read over resource manual tonight. Take 8 units before breakfast, 3u, before lunch and 6u before supper. Call pump help number if questions.

## 2019-07-08 NOTE — Progress Notes (Signed)
Patient ID: [name], female   DOB: [birth year], 54 y.o.   MRN: [id number]           Reason for Appointment : Follow-up for Type 1 Diabetes  History of Present Illness   Referring HCP: Dr. Margarita Rana          Diagnosis: Type 1 diabetes mellitus, date of diagnosis: 1977         Previous history:  She has mostly been followed by primary care physicians for her diabetes with consistently poor control She has had periodic episodes of ketoacidosis  Recent history:   INSULIN regimen: OmniPod insulin pump since 07/07/2019  Basal rate: 0.5u//hr, I/C 1:10;  8u  AcB, 3u AcL, and 6u acS   ISF: 50, target:120 with correction over 120  Previous regimen:  BASAL insulin: LANTUS 15 units in a.m. MEALTIME insulin: Novolog variable doses: Usually 6-8 at breakfast, 2-3 at lunch and dinner  A1c is last 8.8, previously was at 9.8  Current management, blood sugar patterns and problems identified:    She was previously taking 8 units of Lantus twice daily and this was stopped the night before her pump was started yesterday  Even though her basal rate is only giving her 12 units/day her blood sugars appear to be low normal since starting the pump yesterday afternoon  She is now monitoring her blood sugar with the freestyle libre 2; she has checked her sugar with the fingerstick a couple of times and the readings are fairly close  Blood sugars after about 5 PM have been mostly in the 70s and 80s consistently  She only took 2.25 units of insulin yesterday evening for her dinner bolus her blood sugar stayed about the same at 95 after the meal  Although she was previously taking up to 8 units in the morning for breakfast despite eating an egg and sausage biscuit this morning her blood sugar which was 184 before the meal is now 95 and declining postprandially  She did have a low sugar of 64 early morning and she treated this  Overnight blood sugars were stable between about 69-80  Glucose  monitoring:  is being done 8-10 times a day       Glucometer: Freestyle libre 2  Blood Glucose readings as above   PREVIOUS readings:  PRE-MEAL Fasting Lunch  afternoon Bedtime Overall  Glucose range:  67-296   44-330    Mean/median:  166   191     POST-MEAL PC Breakfast PC Lunch PC Dinner  Glucose range:    122-338  Mean/median:    220   Previous readings:  PRE-MEAL Fasting Lunch Dinner Bedtime Overall  Glucose range:  67-197  79-402  117-222 ?   Mean/median:     ?    Symptoms of hypoglycemia: Weakness, sometimes none Treatment of hypoglycemia: Snacks         Self-care: The diet that the patient has been following is: None  Mealtimes are: Breakfast egg, cheese, sausage, occasionally grits, toast;   Lunch: Sandwich or only snacks, dinner: baked chricken, rice, potatoes                Dietician consultation: Most recent: years ago.         CDE consultation: 04/2018  Wt Readings from Last 3 Encounters:  07/08/19 153 lb 9.6 oz (69.7 kg)  05/15/19 163 lb 11.2 oz (74.3 kg)  04/15/19 165 lb 3.2 oz (74.9 kg)   Diabetes labs:  Lab Results  Component Value  Date   HGBA1C 8.8 (A) 04/15/2019   HGBA1C 9.8 (H) 12/15/2018   HGBA1C 8.1 (A) 04/28/2018   Lab Results  Component Value Date   MICROALBUR 14.3 (H) 04/28/2018   LDLCALC 54 01/07/2018   CREATININE 1.33 (H) 05/15/2019    Lab Results  Component Value Date   MICRALBCREAT 17.1 04/28/2018   MICRALBCREAT 254 (H) 09/04/2016     Allergies as of 07/08/2019      Reactions   Metoclopramide Other (See Comments)   Dystonia, muscle rigidity.  Patient states she is only allergic to the "pill form, not the IV" Slurred speech, tongue swelling   Anesthetics, Amide Nausea And Vomiting   Chlorhexidine    Penicillins Diarrhea, Nausea And Vomiting, Other (See Comments)   Has patient had a PCN reaction causing immediate rash, facial/tongue/throat swelling, SOB or lightheadedness with hypotension: Yes Has patient had a PCN reaction  causing severe rash involving mucus membranes or skin necrosis: No Has patient had a PCN reaction that required hospitalization No Has patient had a PCN reaction occurring within the last 10 years: Yes  If all of the above answers are "NO", then may proceed with Cephalosporin use.   Buprenorphine Hcl Rash   Encainide Nausea And Vomiting      Medication List       Accurate as of July 08, 2019 11:21 AM. If you have any questions, ask your nurse or doctor.        STOP taking these medications   Accu-Chek Aviva Plus test strip Generic drug: glucose blood Stopped by: Elayne Snare, MD   insulin glargine 100 UNIT/ML injection Commonly known as: LANTUS Stopped by: Elayne Snare, MD   OneTouch Delica Plus [id number] Misc Stopped by: Elayne Snare, MD   OneTouch Verio w/Device Kit Stopped by: Elayne Snare, MD     TAKE these medications   ammonium lactate 12 % cream Commonly known as: AMLACTIN Apply topically as needed for dry skin.   aspirin EC 81 MG tablet Take 1 tablet (81 mg total) by mouth daily.   busPIRone 10 MG tablet Commonly known as: BUSPAR Take 1 tablet (10 mg total) by mouth 3 (three) times daily.   docusate sodium 100 MG capsule Commonly known as: Colace Take 1 capsule (100 mg total) by mouth daily as needed for mild constipation.   enoxaparin 40 MG/0.4ML injection Commonly known as: LOVENOX Inject 0.4 mLs (40 mg total) into the skin daily for 9 days.   FreeStyle Libre 2 Sensor Systm Misc by Does not apply route.   furosemide 40 MG tablet Commonly known as: LASIX TAKE 1 TABLET BY MOUTH DAILY AS NEEDED FOR FLUID OR EDEMA   insulin aspart 100 UNIT/ML injection Commonly known as: novoLOG Inject 10 units under the skin in the morning, and 8-10 units at supper. What changed: additional instructions   Insulin Syringe-Needle U-100 31G X 5/16" 0.5 ML Misc Commonly known as: BD Insulin Syringe Ultrafine 1 each by Does not apply route 4 (four) times daily.    loperamide 2 MG capsule Commonly known as: IMODIUM Take 1 capsule (2 mg total) by mouth 3 (three) times daily as needed for diarrhea or loose stools.   losartan 25 MG tablet Commonly known as: COZAAR Take 1 tablet (25 mg total) by mouth daily.   metoprolol tartrate 25 MG tablet Commonly known as: LOPRESSOR Take 0.5 tablets (12.5 mg total) by mouth 2 (two) times daily.   nitrofurantoin (macrocrystal-monohydrate) 100 MG capsule Commonly known as: Macrobid Take 1 capsule (  100 mg total) by mouth 2 (two) times daily.   [place] Kit by Does not apply route.   OmniPod Dash 5 Pack Pods Misc by Does not apply route.   ondansetron 4 MG tablet Commonly known as: Zofran Take 1 tablet (4 mg total) by mouth every 8 (eight) hours as needed for nausea or vomiting.   pantoprazole 40 MG tablet Commonly known as: PROTONIX Take 1 tablet (40 mg total) by mouth daily.   polyethylene glycol 17 g packet Commonly known as: MIRALAX / GLYCOLAX Take 17 g by mouth 2 (two) times daily as needed for moderate constipation.   pregabalin 150 MG capsule Commonly known as: Lyrica Take 1 capsule (150 mg total) by mouth 2 (two) times daily.   pyridostigmine 60 MG tablet Commonly known as: MESTINON Take 60 mg by mouth 3 (three) times daily.       Allergies:  Allergies  Allergen Reactions  . Metoclopramide Other (See Comments)    Dystonia, muscle rigidity.  Patient states she is only allergic to the "pill form, not the IV" Slurred speech, tongue swelling  . Anesthetics, Amide Nausea And Vomiting  . Chlorhexidine   . Penicillins Diarrhea, Nausea And Vomiting and Other (See Comments)    Has patient had a PCN reaction causing immediate rash, facial/tongue/throat swelling, SOB or lightheadedness with hypotension: Yes Has patient had a PCN reaction causing severe rash involving mucus membranes or skin necrosis: No Has patient had a PCN reaction that required hospitalization No Has patient  had a PCN reaction occurring within the last 10 years: Yes  If all of the above answers are "NO", then may proceed with Cephalosporin use.   . Buprenorphine Hcl Rash  . Encainide Nausea And Vomiting    Past Medical History:  Diagnosis Date  . AKI (acute kidney injury) ([place])   . Anemia, iron deficiency   . Anxiety and depression 05/18/2015  . CAD (coronary artery disease), native coronary artery 01/11/2018   s/p CABG - Non-STEMI with severe three-vessel disease noted July 2019  . Depression   . Diabetic gastroparesis ([place])   . Diabetic neuropathy, type I diabetes mellitus ([place]) 05/18/2015  . DKA, type 1 ([place]) 03/18/2016  . Essential hypertension   . Gastroparesis   . GERD (gastroesophageal reflux disease)   . HLD (hyperlipidemia)   . MDD (major depressive disorder), single episode, severe , no psychosis ([place])   . Tardive dyskinesia   . Vitamin B12 deficiency 08/16/2015    Past Surgical History:  Procedure Laterality Date  . CARDIAC SURGERY    . COLONOSCOPY  09/27/2014   at [facility]  . CORONARY ARTERY BYPASS GRAFT N/A 01/14/2018   Procedure: CORONARY ARTERY BYPASS GRAFTING (CABG)X3, RIGHT AND LEFT SAPHENOUS VEIN HARVEST, MAMMARY TAKE DOWN. MAMMARY TO LAD, SVG TO PD, SVG TO DISTAL CIRC.;  Surgeon: Grace Isaac, MD;  Location: [place];  Service: Open Heart Surgery;  Laterality: N/A;  . ESOPHAGOGASTRODUODENOSCOPY  09/27/2014   at [facility], Dr Rolan Lipa. biospy neg for celiac, neg for H pylori.   . EYE SURGERY    . gailstones    . INTRAMEDULLARY (IM) NAIL INTERTROCHANTERIC Right 05/10/2019   Procedure: INTRAMEDULLARY (IM) NAIL INTERTROCHANTRIC;  Surgeon: Lovell Sheehan, MD;  Location: [facility];  Service: Orthopedics;  Laterality: Right;  . IR FLUORO GUIDE CV LINE RIGHT  02/01/2017  . IR FLUORO GUIDE CV LINE RIGHT  03/06/2017  . IR FLUORO GUIDE CV LINE RIGHT  03/25/2017  . IR GENERIC HISTORICAL  01/24/2016   IR FLUORO GUIDE CV LINE RIGHT 01/24/2016 Darrell K Allred, PA-C [facility]   . IR GENERIC HISTORICAL  01/24/2016   IR US GUIDE VASC ACCESS RIGHT 01/24/2016 Darrell K Allred, PA-C [facility]  . IR US GUIDE VASC ACCESS RIGHT  02/01/2017  . IR US GUIDE VASC ACCESS RIGHT  03/06/2017  . IR US GUIDE VASC ACCESS RIGHT  03/25/2017  . IR US GUIDE VASC ACCESS RIGHT  01/20/2019  . IR VENIPUNCTURE 75YRS/OLDER BY MD  01/20/2019  . LEFT HEART CATH AND CORONARY ANGIOGRAPHY N/A 01/07/2018   Procedure: LEFT HEART CATH AND CORONARY ANGIOGRAPHY;  Surgeon: Troy Sine, MD;  Location: [place] CV LAB;  Service: Cardiovascular;  Laterality: N/A;  . POSTERIOR VITRECTOMY AND MEMBRANE PEEL-LEFT EYE  09/28/2002  . POSTERIOR VITRECTOMY AND MEMBRANE PEEL-RIGHT EYE  03/16/2002  . RETINAL DETACHMENT SURGERY    . TEE WITHOUT CARDIOVERSION N/A 01/14/2018   Procedure: TRANSESOPHAGEAL ECHOCARDIOGRAM (TEE);  Surgeon: Grace Isaac, MD;  Location: [place];  Service: Open Heart Surgery;  Laterality: N/A;    Family History  Problem Relation Age of Onset  . Cystic fibrosis Mother   . Hypertension Father   . Diabetes Brother   . Hypertension Maternal Grandmother     Social History:  reports that she has never smoked. She has never used smokeless tobacco. She reports current drug use. Drug: Marijuana. She reports that she does not drink alcohol.      Review of Systems      Lipids: Taking atorvastatin for history of CAD prescribed by PCP  Lab Results  Component Value Date   CHOL 121 01/07/2018   HDL 47 01/07/2018   LDLCALC 54 01/07/2018   TRIG 101 01/07/2018   CHOLHDL 2.6 01/07/2018     DIABETES COMPLICATIONS: Gastroparesis treated with Phenergan as needed She was told to try Mestinon by a gastroenterologist in [place]; she does not think this was effective Still not able to follow-up because of the distance Does not see the local gastroenterologist because of no effective treatment being available   On Lyrica for peripheral neuropathy and is asking for refill    Physical  Examination:  BP 122/68 (BP Location: Left Arm, Patient Position: Sitting, Cuff Size: Large)   Pulse 91   Ht 5' 1"  (1.549 m)   Wt 153 lb 9.6 oz (69.7 kg)   SpO2 99%   BMI 29.02 kg/m       ASSESSMENT:  Diabetes type 1, long-standing with multiple complications  See history of present illness for detailed discussion of current diabetes management, blood sugar patterns and problems identified  She is just starting the insulin pump with the OmniPod Also now starting the CGM with freestyle libre at the same time.  She appears to be needing much less insulin than before with the pump even with reducing her total basal insulin by 4 units  As discussed in detail above her blood sugar patterns from the freestyle libre and boluses reviewed from her insulin pump download show that she is maintaining low normal blood sugars This morning blood sugar was 184 before breakfast which may have been a slight rebound from a sugar of 64 earlier this morning Freestyle libre readings compared well with the fingersticks according to the patient She has been set up for high and low alerts     PLAN:   Basal rate will be reduced to 0.4 on the clock If she has a dawn phenomenon tomorrow morning we will  need to adjust her basal rate accordingly She will only bolus 4 units for breakfast and 2 to 3 units for lunch and dinner based on her meal size Encourage her to count carbohydrates and consider consultation with dietitian Still periodically needs to check fingerstick readings to compare with freestyle libre Make sure she boluses before starting to eat If she appears to be getting low readings right after her bolus may consider extended boluses and she was shown how to do this She will have further education tomorrow and will need to be sure not to program her basal rate changes which she is not comfortable doing as yet  Lyrica prescription refill for neuropathy has been sent  There are no Patient  Instructions on file for this visit.    Elayne Snare 07/08/2019, 11:21 AM     Note: This note was prepared with Dragon voice recognition system technology. Any transcriptional errors that result from this process are unintentional.

## 2019-07-08 NOTE — Telephone Encounter (Signed)
Will depend on her CGM pattern.  She can learn how to do it anyway

## 2019-07-09 ENCOUNTER — Ambulatory Visit (INDEPENDENT_AMBULATORY_CARE_PROVIDER_SITE_OTHER): Payer: Medicare HMO | Admitting: Endocrinology

## 2019-07-09 ENCOUNTER — Encounter: Payer: Self-pay | Admitting: Endocrinology

## 2019-07-09 ENCOUNTER — Encounter: Payer: Medicare HMO | Admitting: Nutrition

## 2019-07-09 DIAGNOSIS — E1065 Type 1 diabetes mellitus with hyperglycemia: Secondary | ICD-10-CM

## 2019-07-09 DIAGNOSIS — E10649 Type 1 diabetes mellitus with hypoglycemia without coma: Secondary | ICD-10-CM

## 2019-07-09 NOTE — Progress Notes (Signed)
Patient ID: [name], female   DOB: 1966/06/08, 54 y.o.   MRN: [id number]           Reason for Appointment : Follow-up for Type 1 Diabetes  History of Present Illness          Diagnosis: Type 1 diabetes mellitus, date of diagnosis: 1977         Previous history:  She has mostly been followed by primary care physicians for her diabetes with consistently poor control She has had periodic episodes of ketoacidosis  Recent history:   INSULIN regimen: OmniPod insulin pump since 07/07/2019  Basal rate: Midnight-12 noon = . 0.4 units, I/C ratio 1:1;   ISF: 50, target:120 with correction over 120  Previous regimen:  BASAL insulin: LANTUS 15 units in a.m. MEALTIME insulin: Novolog variable doses: Usually 6-8 at breakfast, 2-3 at lunch and dinner  A1c is last 8.8, previously was at 9.8  Current management, blood sugar patterns and problems identified:    She was having low normal sugars on the first day of her pump especially early morning but with reducing her basal rate down from 0.5 down to 0.4 her sugars have been higher  Most of her blood sugars are around 180-200 range since yesterday afternoon  She however got hypoglycemic after her lunch bolus yesterday afternoon with a reading down to 52  She had only potato salad as carbohydrate along with chicken tenders but bolused 4.5 units when her sugar was 196 AC  Her blood sugar was in the low range about 1-1/2 hours later  Subsequently overnight blood sugars were fairly level with the lowest reading 157 around 3:30 AM  She has a dawn phenomenon with blood sugars rising after 5 AM with fasting of 200 today  Blood sugar after her mealtime bolus this morning which included about 3 carbohydrates was 205 compared to 234 before eating  She has had no difficulty with putting in her boluses and trying to bolus before starting to eat  Glucose monitoring:  is being done 8-10 times a day       Glucometer: Freestyle libre 2  Blood  Glucose readings as above   PREVIOUS readings:  PRE-MEAL Fasting Lunch  afternoon Bedtime Overall  Glucose range:  67-296   44-330    Mean/median:  166   191     POST-MEAL PC Breakfast PC Lunch PC Dinner  Glucose range:    122-338  Mean/median:    220    Symptoms of hypoglycemia: Weakness, sometimes none Treatment of hypoglycemia: Snacks         Self-care: The diet that the patient has been following is: None  Mealtimes are: Breakfast egg, cheese, sausage, occasionally grits, toast;   Lunch: Sandwich or only snacks, dinner: baked chricken, rice, potatoes                Dietician consultation: Most recent: years ago.         CDE consultation: 04/2018  Wt Readings from Last 3 Encounters:  07/08/19 153 lb 9.6 oz (69.7 kg)  05/15/19 163 lb 11.2 oz (74.3 kg)  04/15/19 165 lb 3.2 oz (74.9 kg)   Diabetes labs:  Lab Results  Component Value Date   HGBA1C 8.8 (A) 04/15/2019   HGBA1C 9.8 (H) 12/15/2018   HGBA1C 8.1 (A) 04/28/2018   Lab Results  Component Value Date   MICROALBUR 14.3 (H) 04/28/2018   LDLCALC 54 01/07/2018   CREATININE 1.33 (H) 05/15/2019    Lab Results  Component Value Date   MICRALBCREAT 17.1 04/28/2018   MICRALBCREAT 254 (H) 09/04/2016     Allergies as of 07/09/2019      Reactions   Metoclopramide Other (See Comments)   Dystonia, muscle rigidity.  Patient states she is only allergic to the "pill form, not the IV" Slurred speech, tongue swelling   Anesthetics, Amide Nausea And Vomiting   Chlorhexidine    Penicillins Diarrhea, Nausea And Vomiting, Other (See Comments)   Has patient had a PCN reaction causing immediate rash, facial/tongue/throat swelling, SOB or lightheadedness with hypotension: Yes Has patient had a PCN reaction causing severe rash involving mucus membranes or skin necrosis: No Has patient had a PCN reaction that required hospitalization No Has patient had a PCN reaction occurring within the last 10 years: Yes  If all of the above  answers are "NO", then may proceed with Cephalosporin use.   Buprenorphine Hcl Rash   Encainide Nausea And Vomiting      Medication List       Accurate as of July 09, 2019 11:08 AM. If you have any questions, ask your nurse or doctor.        ammonium lactate 12 % cream Commonly known as: AMLACTIN Apply topically as needed for dry skin.   aspirin EC 81 MG tablet Take 1 tablet (81 mg total) by mouth daily.   busPIRone 10 MG tablet Commonly known as: BUSPAR Take 1 tablet (10 mg total) by mouth 3 (three) times daily.   docusate sodium 100 MG capsule Commonly known as: Colace Take 1 capsule (100 mg total) by mouth daily as needed for mild constipation.   enoxaparin 40 MG/0.4ML injection Commonly known as: LOVENOX Inject 0.4 mLs (40 mg total) into the skin daily for 9 days.   FreeStyle Libre 2 Sensor Systm Misc by Does not apply route.   furosemide 40 MG tablet Commonly known as: LASIX TAKE 1 TABLET BY MOUTH DAILY AS NEEDED FOR FLUID OR EDEMA   insulin aspart 100 UNIT/ML injection Commonly known as: novoLOG Inject 10 units under the skin in the morning, and 8-10 units at supper. What changed: additional instructions   Insulin Syringe-Needle U-100 31G X 5/16" 0.5 ML Misc Commonly known as: BD Insulin Syringe Ultrafine 1 each by Does not apply route 4 (four) times daily.   loperamide 2 MG capsule Commonly known as: IMODIUM Take 1 capsule (2 mg total) by mouth 3 (three) times daily as needed for diarrhea or loose stools.   losartan 25 MG tablet Commonly known as: COZAAR Take 1 tablet (25 mg total) by mouth daily.   metoprolol tartrate 25 MG tablet Commonly known as: LOPRESSOR Take 0.5 tablets (12.5 mg total) by mouth 2 (two) times daily.   nitrofurantoin (macrocrystal-monohydrate) 100 MG capsule Commonly known as: Macrobid Take 1 capsule (100 mg total) by mouth 2 (two) times daily.   Braham Kit by Does not apply route.   OmniPod Dash 5 Pack  Pods Misc by Does not apply route.   ondansetron 4 MG tablet Commonly known as: Zofran Take 1 tablet (4 mg total) by mouth every 8 (eight) hours as needed for nausea or vomiting.   pantoprazole 40 MG tablet Commonly known as: PROTONIX Take 1 tablet (40 mg total) by mouth daily.   polyethylene glycol 17 g packet Commonly known as: MIRALAX / GLYCOLAX Take 17 g by mouth 2 (two) times daily as needed for moderate constipation.   pregabalin 150 MG capsule Commonly known as: Lyrica Take  1 capsule (150 mg total) by mouth 2 (two) times daily.   pyridostigmine 60 MG tablet Commonly known as: MESTINON Take 60 mg by mouth 3 (three) times daily.       Allergies:  Allergies  Allergen Reactions  . Metoclopramide Other (See Comments)    Dystonia, muscle rigidity.  Patient states she is only allergic to the "pill form, not the IV" Slurred speech, tongue swelling  . Anesthetics, Amide Nausea And Vomiting  . Chlorhexidine   . Penicillins Diarrhea, Nausea And Vomiting and Other (See Comments)    Has patient had a PCN reaction causing immediate rash, facial/tongue/throat swelling, SOB or lightheadedness with hypotension: Yes Has patient had a PCN reaction causing severe rash involving mucus membranes or skin necrosis: No Has patient had a PCN reaction that required hospitalization No Has patient had a PCN reaction occurring within the last 10 years: Yes  If all of the above answers are "NO", then may proceed with Cephalosporin use.   . Buprenorphine Hcl Rash  . Encainide Nausea And Vomiting    Past Medical History:  Diagnosis Date  . AKI (acute kidney injury) ([place])   . Anemia, iron deficiency   . Anxiety and depression 05/18/2015  . CAD (coronary artery disease), native coronary artery 01/11/2018   s/p CABG - Non-STEMI with severe three-vessel disease noted July 2019  . Depression   . Diabetic gastroparesis ([place])   . Diabetic neuropathy, type I diabetes mellitus ([place]) 05/18/2015   . DKA, type 1 ([place]) 03/18/2016  . Essential hypertension   . Gastroparesis   . GERD (gastroesophageal reflux disease)   . HLD (hyperlipidemia)   . MDD (major depressive disorder), single episode, severe , no psychosis ([place])   . Tardive dyskinesia   . Vitamin B12 deficiency 08/16/2015    Past Surgical History:  Procedure Laterality Date  . CARDIAC SURGERY    . COLONOSCOPY  09/27/2014   at [facility]  . CORONARY ARTERY BYPASS GRAFT N/A 01/14/2018   Procedure: CORONARY ARTERY BYPASS GRAFTING (CABG)X3, RIGHT AND LEFT SAPHENOUS VEIN HARVEST, MAMMARY TAKE DOWN. MAMMARY TO LAD, SVG TO PD, SVG TO DISTAL CIRC.;  Surgeon: Grace Isaac, MD;  Location: [place];  Service: Open Heart Surgery;  Laterality: N/A;  . ESOPHAGOGASTRODUODENOSCOPY  09/27/2014   at [facility], Dr Rolan Lipa. biospy neg for celiac, neg for H pylori.   . EYE SURGERY    . gailstones    . INTRAMEDULLARY (IM) NAIL INTERTROCHANTERIC Right 05/10/2019   Procedure: INTRAMEDULLARY (IM) NAIL INTERTROCHANTRIC;  Surgeon: Lovell Sheehan, MD;  Location: [facility];  Service: Orthopedics;  Laterality: Right;  . IR FLUORO GUIDE CV LINE RIGHT  02/01/2017  . IR FLUORO GUIDE CV LINE RIGHT  03/06/2017  . IR FLUORO GUIDE CV LINE RIGHT  03/25/2017  . IR GENERIC HISTORICAL  01/24/2016   IR FLUORO GUIDE CV LINE RIGHT 01/24/2016 Darrell K Allred, PA-C [facility]  . IR GENERIC HISTORICAL  01/24/2016   IR US GUIDE VASC ACCESS RIGHT 01/24/2016 Darrell K Allred, PA-C [facility]  . IR US GUIDE VASC ACCESS RIGHT  02/01/2017  . IR US GUIDE VASC ACCESS RIGHT  03/06/2017  . IR US GUIDE VASC ACCESS RIGHT  03/25/2017  . IR US GUIDE VASC ACCESS RIGHT  01/20/2019  . IR VENIPUNCTURE 27YRS/OLDER BY MD  01/20/2019  . LEFT HEART CATH AND CORONARY ANGIOGRAPHY N/A 01/07/2018   Procedure: LEFT HEART CATH AND CORONARY ANGIOGRAPHY;  Surgeon: Troy Sine, MD;  Location: [facility];  Service: Cardiovascular;  Laterality: N/A;  . POSTERIOR VITRECTOMY AND MEMBRANE PEEL-LEFT  EYE  09/28/2002  . POSTERIOR VITRECTOMY AND MEMBRANE PEEL-RIGHT EYE  03/16/2002  . RETINAL DETACHMENT SURGERY    . TEE WITHOUT CARDIOVERSION N/A 01/14/2018   Procedure: TRANSESOPHAGEAL ECHOCARDIOGRAM (TEE);  Surgeon: Grace Isaac, MD;  Location: [facility];  Service: Open Heart Surgery;  Laterality: N/A;    Family History  Problem Relation Age of Onset  . Cystic fibrosis Mother   . Hypertension Father   . Diabetes Brother   . Hypertension Maternal Grandmother     Social History:  reports that she has never smoked. She has never used smokeless tobacco. She reports current drug use. Drug: Marijuana. She reports that she does not drink alcohol.      Review of Systems      Lipids: She had been taking atorvastatin for history of CAD prescribed by PCP, not clear if this has been continued  Lab Results  Component Value Date   CHOL 121 01/07/2018   HDL 47 01/07/2018   LDLCALC 54 01/07/2018   TRIG 101 01/07/2018   CHOLHDL 2.6 01/07/2018     DIABETES COMPLICATIONS: Gastroparesis treated with Phenergan as needed She was told to try Mestinon by a gastroenterologist in [place]; she does not think this was effective Still not able to follow-up because of the distance Does not see the local gastroenterologist because of no effective treatment being available   On Lyrica for peripheral neuropathy, refilled yesterday    Physical Examination:  There were no vitals taken for this visit.      ASSESSMENT:  Diabetes type 1, long-standing with multiple complications  See history of present illness for detailed discussion of current diabetes management, blood sugar patterns and problems identified  She is back on day 2 after starting the insulin pump with the OmniPod Using CGM with freestyle libre and the download was reviewed Pump download was reviewed to get information of her bolus history and settings  She has checked her sugars very consistently with her freestyle libre  around the clock  She has been able to do her basic pump functions including boluses appropriately She is also entering her boluses based on what she is eating although not doing carbohydrate counting and simply putting in the number of units using 1: 1 carbohydrate ratio This seems to be working for simplicity However yesterday with having a relatively lower carbohydrate and higher fat meal at lunch she got hypoglycemic down to 52  However now her blood sugars are between 150 and 230 and this is likely from washout of her Lantus insulin after yesterday Also has dawn phenomenon She is still needs considerable education regarding changing her settings herself, learning about extended boluses and estimating how to cover her meals with appropriate doses of insulin  PLAN:   Basal rate settings will be as follows: Midnight = 0.45, 5 AM = 0.55, 9 AM = 0.50 Extended bolus for relatively higher fat meals instead of straight bolus Try to avoid high-fat meals Reduce boluses for lower carbohydrate meals especially at lunchtime when she should not put in more than 2 units for average size meal Discussed further management of her pump for various situations and she will continue education from the diabetes educator today Review her blood sugar patterns and make adjustments as needed tomorrow again  There are no Patient Instructions on file for this visit.    Elayne Snare 07/09/2019, 11:08 AM     Note:  This note was prepared with Estate agent. Any transcriptional errors that result from this process are unintentional.

## 2019-07-09 NOTE — Patient Instructions (Signed)
Bring pod, and insulin tomorrow for a pod change Continue to review resource manual

## 2019-07-09 NOTE — Progress Notes (Signed)
Patient reported no difficulty giving bolus this AM.  Blood sugar was 144 with a down arrow, and with 4.6u of IOB.  WE discussed the idea again of IOB, and I believe she understands this.  She was given 2 glasses of juice and then told to eat her package of peanut butter crackers.  She was encouraged to also carry fast acting carbs.  Examples of this were given to her.   She was shown how to make the changes to her pump settings, but needs further help with this. She had 4 printed questions for me that were answered.  We will do a pod change tomorrow at her request.  She reports having read the resource manual and had her questions printed in that book.   She had no final questions

## 2019-07-10 ENCOUNTER — Encounter: Payer: Self-pay | Admitting: Endocrinology

## 2019-07-10 ENCOUNTER — Other Ambulatory Visit: Payer: Self-pay

## 2019-07-10 ENCOUNTER — Ambulatory Visit (INDEPENDENT_AMBULATORY_CARE_PROVIDER_SITE_OTHER): Payer: Medicare HMO | Admitting: Endocrinology

## 2019-07-10 VITALS — Ht 61.0 in

## 2019-07-10 DIAGNOSIS — E1065 Type 1 diabetes mellitus with hyperglycemia: Secondary | ICD-10-CM

## 2019-07-10 MED ORDER — PREGABALIN 150 MG PO CAPS: 150.0000 mg | ORAL_CAPSULE | Freq: Two times a day (BID) | ORAL | 1 refills | Status: DC

## 2019-07-10 NOTE — Progress Notes (Signed)
Patient ID: [name], female   DOB: [birth year], 54 y.o.   MRN: [id number]           Reason for Appointment : Follow-up for Type 1 Diabetes  History of Present Illness          Diagnosis: Type 1 diabetes mellitus, date of diagnosis: 1977         Previous history:  She has mostly been followed by primary care physicians for her diabetes with consistently poor control She has had periodic episodes of ketoacidosis  Recent history:   INSULIN regimen: OmniPod insulin pump since 07/07/2019  Basal rate: Midnight-5 AM = . 0.45 units, 5 AM to 9 AM = 0.5 and 9 AM to 12 AM = 0.45 I/C ratio 1:1;   ISF: 40, target:120 with correction over 120  Previous regimen:  BASAL insulin: LANTUS 15 units in a.m. MEALTIME insulin: Novolog variable doses: Usually 6-8 at breakfast, 2-3 at lunch and dinner  A1c is last 8.8, previously was at 9.8  Current management, blood sugar patterns and problems identified:    She was having higher blood sugars yesterday and her basal rate was increased slightly especially at 5 AM  This was done around noon yesterday  However before lunch her blood sugar was 203 and postprandial was 183, she had a  A lot of potato salad and entered a total of 4 units bolus  Blood sugar was still high at 6 PM at 179 but at dinnertime blood sugar was down to 143  She had chicken and rice casserole for which she entered a total of 4.5 units  With this her blood sugar came down to 92  However after midnight her blood sugar was stable around 160-170  This morning at breakfast time it was 202  Today after breakfast which was a biscuit blood sugar is down to about 175  No hypoglycemia  Glucose monitoring:  is being done 8-10 times a day       Glucometer: Freestyle libre 2  Blood Glucose readings as above   PREVIOUS readings:  PRE-MEAL Fasting Lunch  afternoon Bedtime Overall  Glucose range:  67-296   44-330    Mean/median:  166   191     POST-MEAL PC Breakfast PC  Lunch PC Dinner  Glucose range:    122-338  Mean/median:    220    Symptoms of hypoglycemia: Weakness, sometimes none Treatment of hypoglycemia: Snacks         Self-care: The diet that the patient has been following is: None  Mealtimes are: Breakfast egg, cheese, sausage, occasionally grits, toast;   Lunch: Sandwich or only snacks, dinner: baked chricken, rice, potatoes                Dietician consultation: Most recent: years ago.         CDE consultation: 04/2018  Wt Readings from Last 3 Encounters:  07/08/19 153 lb 9.6 oz (69.7 kg)  05/15/19 163 lb 11.2 oz (74.3 kg)  04/15/19 165 lb 3.2 oz (74.9 kg)   Diabetes labs:  Lab Results  Component Value Date   HGBA1C 8.8 (A) 04/15/2019   HGBA1C 9.8 (H) 12/15/2018   HGBA1C 8.1 (A) 04/28/2018   Lab Results  Component Value Date   MICROALBUR 14.3 (H) 04/28/2018   LDLCALC 54 01/07/2018   CREATININE 1.33 (H) 05/15/2019    Lab Results  Component Value Date   MICRALBCREAT 17.1 04/28/2018   MICRALBCREAT 254 (H) 09/04/2016  Allergies as of 07/10/2019      Reactions   Metoclopramide Other (See Comments)   Dystonia, muscle rigidity.  Patient states she is only allergic to the "pill form, not the IV" Slurred speech, tongue swelling   Anesthetics, Amide Nausea And Vomiting   Chlorhexidine    Penicillins Diarrhea, Nausea And Vomiting, Other (See Comments)   Has patient had a PCN reaction causing immediate rash, facial/tongue/throat swelling, SOB or lightheadedness with hypotension: Yes Has patient had a PCN reaction causing severe rash involving mucus membranes or skin necrosis: No Has patient had a PCN reaction that required hospitalization No Has patient had a PCN reaction occurring within the last 10 years: Yes  If all of the above answers are "NO", then may proceed with Cephalosporin use.   Buprenorphine Hcl Rash   Encainide Nausea And Vomiting      Medication List       Accurate as of July 10, 2019  3:08 PM. If  you have any questions, ask your nurse or doctor.        ammonium lactate 12 % cream Commonly known as: AMLACTIN Apply topically as needed for dry skin.   aspirin EC 81 MG tablet Take 1 tablet (81 mg total) by mouth daily.   busPIRone 10 MG tablet Commonly known as: BUSPAR Take 1 tablet (10 mg total) by mouth 3 (three) times daily.   docusate sodium 100 MG capsule Commonly known as: Colace Take 1 capsule (100 mg total) by mouth daily as needed for mild constipation.   enoxaparin 40 MG/0.4ML injection Commonly known as: LOVENOX Inject 0.4 mLs (40 mg total) into the skin daily for 9 days.   FreeStyle Libre 2 Sensor Systm Misc by Does not apply route.   furosemide 40 MG tablet Commonly known as: LASIX TAKE 1 TABLET BY MOUTH DAILY AS NEEDED FOR FLUID OR EDEMA   insulin aspart 100 UNIT/ML injection Commonly known as: novoLOG Inject 10 units under the skin in the morning, and 8-10 units at supper. What changed: additional instructions   Insulin Syringe-Needle U-100 31G X 5/16" 0.5 ML Misc Commonly known as: BD Insulin Syringe Ultrafine 1 each by Does not apply route 4 (four) times daily.   loperamide 2 MG capsule Commonly known as: IMODIUM Take 1 capsule (2 mg total) by mouth 3 (three) times daily as needed for diarrhea or loose stools.   losartan 25 MG tablet Commonly known as: COZAAR Take 1 tablet (25 mg total) by mouth daily.   metoprolol tartrate 25 MG tablet Commonly known as: LOPRESSOR Take 0.5 tablets (12.5 mg total) by mouth 2 (two) times daily.   nitrofurantoin (macrocrystal-monohydrate) 100 MG capsule Commonly known as: Macrobid Take 1 capsule (100 mg total) by mouth 2 (two) times daily.   Tull Kit by Does not apply route.   OmniPod Dash 5 Pack Pods Misc by Does not apply route.   ondansetron 4 MG tablet Commonly known as: Zofran Take 1 tablet (4 mg total) by mouth every 8 (eight) hours as needed for nausea or vomiting.     pantoprazole 40 MG tablet Commonly known as: PROTONIX Take 1 tablet (40 mg total) by mouth daily.   polyethylene glycol 17 g packet Commonly known as: MIRALAX / GLYCOLAX Take 17 g by mouth 2 (two) times daily as needed for moderate constipation.   pregabalin 150 MG capsule Commonly known as: Lyrica Take 1 capsule (150 mg total) by mouth 2 (two) times daily.   pyridostigmine 60 MG  tablet Commonly known as: MESTINON Take 60 mg by mouth 3 (three) times daily.       Allergies:  Allergies  Allergen Reactions  . Metoclopramide Other (See Comments)    Dystonia, muscle rigidity.  Patient states she is only allergic to the "pill form, not the IV" Slurred speech, tongue swelling  . Anesthetics, Amide Nausea And Vomiting  . Chlorhexidine   . Penicillins Diarrhea, Nausea And Vomiting and Other (See Comments)    Has patient had a PCN reaction causing immediate rash, facial/tongue/throat swelling, SOB or lightheadedness with hypotension: Yes Has patient had a PCN reaction causing severe rash involving mucus membranes or skin necrosis: No Has patient had a PCN reaction that required hospitalization No Has patient had a PCN reaction occurring within the last 10 years: Yes  If all of the above answers are "NO", then may proceed with Cephalosporin use.   . Buprenorphine Hcl Rash  . Encainide Nausea And Vomiting    Past Medical History:  Diagnosis Date  . AKI (acute kidney injury) ([place])   . Anemia, iron deficiency   . Anxiety and depression 05/18/2015  . CAD (coronary artery disease), native coronary artery 01/11/2018   s/p CABG - Non-STEMI with severe three-vessel disease noted July 2019  . Depression   . Diabetic gastroparesis ([place])   . Diabetic neuropathy, type I diabetes mellitus ([place]) 05/18/2015  . DKA, type 1 ([place]) 03/18/2016  . Essential hypertension   . Gastroparesis   . GERD (gastroesophageal reflux disease)   . HLD (hyperlipidemia)   . MDD (major depressive disorder),  single episode, severe , no psychosis ([place])   . Tardive dyskinesia   . Vitamin B12 deficiency 08/16/2015    Past Surgical History:  Procedure Laterality Date  . CARDIAC SURGERY    . COLONOSCOPY  09/27/2014   at [facility]  . CORONARY ARTERY BYPASS GRAFT N/A 01/14/2018   Procedure: CORONARY ARTERY BYPASS GRAFTING (CABG)X3, RIGHT AND LEFT SAPHENOUS VEIN HARVEST, MAMMARY TAKE DOWN. MAMMARY TO LAD, SVG TO PD, SVG TO DISTAL CIRC.;  Surgeon: Grace Isaac, MD;  Location: [facility];  Service: Open Heart Surgery;  Laterality: N/A;  . ESOPHAGOGASTRODUODENOSCOPY  09/27/2014   at [facility], Dr Rolan Lipa. biospy neg for celiac, neg for H pylori.   . EYE SURGERY    . gailstones    . INTRAMEDULLARY (IM) NAIL INTERTROCHANTERIC Right 05/10/2019   Procedure: INTRAMEDULLARY (IM) NAIL INTERTROCHANTRIC;  Surgeon: Lovell Sheehan, MD;  Location: [facility];  Service: Orthopedics;  Laterality: Right;  . IR FLUORO GUIDE CV LINE RIGHT  02/01/2017  . IR FLUORO GUIDE CV LINE RIGHT  03/06/2017  . IR FLUORO GUIDE CV LINE RIGHT  03/25/2017  . IR GENERIC HISTORICAL  01/24/2016   IR FLUORO GUIDE CV LINE RIGHT 01/24/2016 Darrell K Allred, PA-C [facility]  . IR GENERIC HISTORICAL  01/24/2016   IR US GUIDE VASC ACCESS RIGHT 01/24/2016 Darrell K Allred, PA-C [facility]  . IR US GUIDE VASC ACCESS RIGHT  02/01/2017  . IR US GUIDE VASC ACCESS RIGHT  03/06/2017  . IR US GUIDE VASC ACCESS RIGHT  03/25/2017  . IR US GUIDE VASC ACCESS RIGHT  01/20/2019  . IR VENIPUNCTURE 75YRS/OLDER BY MD  01/20/2019  . LEFT HEART CATH AND CORONARY ANGIOGRAPHY N/A 01/07/2018   Procedure: LEFT HEART CATH AND CORONARY ANGIOGRAPHY;  Surgeon: Troy Sine, MD;  Location: [facility];  Service: Cardiovascular;  Laterality: N/A;  . POSTERIOR VITRECTOMY AND MEMBRANE PEEL-LEFT EYE  09/28/2002  . POSTERIOR VITRECTOMY AND MEMBRANE PEEL-RIGHT EYE  03/16/2002  . RETINAL DETACHMENT SURGERY    . TEE WITHOUT CARDIOVERSION N/A 01/14/2018   Procedure: TRANSESOPHAGEAL  ECHOCARDIOGRAM (TEE);  Surgeon: Grace Isaac, MD;  Location: [facility];  Service: Open Heart Surgery;  Laterality: N/A;    Family History  Problem Relation Age of Onset  . Cystic fibrosis Mother   . Hypertension Father   . Diabetes Brother   . Hypertension Maternal Grandmother     Social History:  reports that she has never smoked. She has never used smokeless tobacco. She reports current drug use. Drug: Marijuana. She reports that she does not drink alcohol.      Review of Systems      Lipids: She had been taking atorvastatin for history of CAD prescribed by PCP, not clear if this has been continued  Lab Results  Component Value Date   CHOL 121 01/07/2018   HDL 47 01/07/2018   LDLCALC 54 01/07/2018   TRIG 101 01/07/2018   CHOLHDL 2.6 01/07/2018     DIABETES COMPLICATIONS: Gastroparesis treated with Phenergan as needed She was told to try Mestinon by a gastroenterologist in [place]; she does not think this was effective Still not able to follow-up because of the distance Does not see the local gastroenterologist because of no effective treatment being available   On Lyrica 150 mg for peripheral neuropathy, refilled     Physical Examination:  Ht 5' 1"  (1.549 m)   BMI 29.02 kg/m       ASSESSMENT:  Diabetes type 1, long-standing with multiple complications  See history of present illness for detailed discussion of current diabetes management, blood sugar patterns and problems identified  She is back on day 3 after starting the insulin pump with the OmniPod Using CGM with freestyle libre and the blood sugar pattern from download was reviewed Pump download was reviewed to get information of her bolus history and settings  She has monitored her blood sugars consistently and at least 8-10 times each day  Despite increasing her basal rate to as much is 0.5 her blood sugars are still running mostly high around 160-200 premeal especially at breakfast Blood  sugar did start coming down around suppertime and with her evening bolus blood sugar came down to 92 but she did not have much carbohydrates with this meal Still is empirically judging her boluses and not always proportional to her carbohydrate intake  Overall still requiring much less insulin than with subcutaneous injections but her blood sugar patterns are much more stable  PLAN:   Basal rate settings will be as follows: Midnight = 0.5, 5 AM = 0.55, 9 AM = 0.50 Reminded her to use extended bolus for relatively higher fat meals instead of straight bolus Follow-up in 2 weeks  There are no Patient Instructions on file for this visit.    Elayne Snare 07/10/2019, 3:08 PM     Note: This note was prepared with Dragon voice recognition system technology. Any transcriptional errors that result from this process are unintentional.

## 2019-07-11 NOTE — Progress Notes (Signed)
After seeing Dr Dwyane Dee, patient was trained on how to make changes to her pump settings.  Written instructions were given for this.  We discussed what each of the changes ment.  New basal rate:  MN: 0.45, 5AM: 0.50, 9AM: 0.45 (pump can not do 0.475).  ISF: 40 We also discussed alerts and alarms.  She reported good understanding of this.   She did pod change with help of resource manual and myself.  She took notes while doing this, and had no questions.  We reviewed the checklist and she signed off as understanding all topics and had no final questions.

## 2019-07-11 NOTE — Patient Instructions (Signed)
Review resource manual. Call 800 pump number if questions.   Return next week for final review

## 2019-07-13 ENCOUNTER — Telehealth: Payer: Self-pay

## 2019-07-13 NOTE — Telephone Encounter (Signed)
Patient called in stating that her sugars have been running high since being on the omnipod wanted to call in and make sure to notify the Dr, and nurse and Mrs [name]      Please call and advise

## 2019-07-13 NOTE — Telephone Encounter (Signed)
Please have someone in the office address this issue

## 2019-07-13 NOTE — Telephone Encounter (Signed)
Called pt and gave her MD message. Pt changed basal rate while on the phone with me.

## 2019-07-13 NOTE — Telephone Encounter (Signed)
Dr. Kelton Pillar, could you please advise in Dr. Ronnie Derby absence? Called pt to get some more information. Pt stated that her blood sugars have been high today and reported that they were "kind of high" yesterday. Blood sugars are as follows:  07/13/2019 8:37am-HI (pt then took 12 units of regular insulin via shot, not through [place] 12:31pm-296 (pt bolused 3 units before eating lunch plus 7 extra because omnipod instructed pt to do so.)  07/12/19 9:18pm-64 10:02pm-67 10:35pm-79 11:44pm,-119 1:17am-246

## 2019-07-14 ENCOUNTER — Telehealth: Payer: Self-pay | Admitting: Nutrition

## 2019-07-14 NOTE — Telephone Encounter (Signed)
She will try to come tomorrow at 11:00.  If you can not see her, I will.

## 2019-07-14 NOTE — Telephone Encounter (Signed)
She probably will need to see both

## 2019-07-14 NOTE — Telephone Encounter (Signed)
Do you need additional blood sugars outside of what is listed below?

## 2019-07-14 NOTE — Telephone Encounter (Signed)
Patient was told to take 12u with a syringe of her fast acting insulin.  She says that the insulin was the one I gave her last week.  It is clear with nothing floating in it or it is not cloudy. She will try to get some transportation to the office today between 2 and 3PM.  She will call here at 3 if not able.  I will try to talk her into coming tomorrow, if unable to come today.

## 2019-07-14 NOTE — Telephone Encounter (Signed)
Patient requests to be called at ph# [phone] to be advised re: Patient is still having high blood sugar readings (562 when tested blood sugar/ meter said High-after patient changed dosage after speaking with Olen Cordial on 07/13/19).

## 2019-07-14 NOTE — Telephone Encounter (Signed)
Called pt and tried to schedule her for this afternoon. Pt state that she did not have transportation to come in this afternoon. She also stated that when she spoke with L.Spagnola,RN,CDE, she was given instructions.

## 2019-07-14 NOTE — Telephone Encounter (Signed)
She needs to take 12 units of her fast acting insulin with syringe or pen now, to also check if she is using an outdated insulin vial.  If she can come into the office this afternoon would be preferable

## 2019-07-14 NOTE — Telephone Encounter (Signed)
Blood sugar is 397 2 hrs. pc injection of 12u.

## 2019-07-14 NOTE — Telephone Encounter (Signed)
Will she being seeing you or L.Spagnol,RN?

## 2019-07-14 NOTE — Telephone Encounter (Signed)
10:57AM: Patient reports that her freestyle Elenor Legato is reading "hi" and does not know what to do.  She was told to change the pod, and test her blood sugar and call me right back.  She agreed to do this.  11:20AM:  She changed her pod and tested her blood sugar on her Accucheck  It read "hi" (over 600) Please advise

## 2019-07-14 NOTE — Telephone Encounter (Signed)
[name] has spoken to her and I have recommended that she come in this afternoon between 2 and 3 as it is unclear what the problem is

## 2019-07-14 NOTE — Telephone Encounter (Signed)
She can take another 6 units injection.  Please check to see if her sugars remain high over the last 24 hours consistently.  If so will need to increase her basal rate by 0.2 round-the-clock.  She needs to increase her fluids.  And confirm that she has not had any steroids, thanks

## 2019-07-14 NOTE — Telephone Encounter (Signed)
She denies any steroid use.  She reports that her blood sugars have been very high in the 300s or higher for last 2 days.   She was told to increase her basal rate by 0.2.  She did this with no help from me.  She is planning to come tomorrow, but will try for 11AM, if not she knows she has someone to take her at 2AM.

## 2019-07-15 ENCOUNTER — Encounter: Payer: Medicare HMO | Admitting: Nutrition

## 2019-07-15 ENCOUNTER — Telehealth: Payer: Self-pay | Admitting: Nutrition

## 2019-07-15 ENCOUNTER — Other Ambulatory Visit: Payer: Self-pay

## 2019-07-15 DIAGNOSIS — E1040 Type 1 diabetes mellitus with diabetic neuropathy, unspecified: Secondary | ICD-10-CM

## 2019-07-15 MED ORDER — FREESTYLE LIBRE 2 SENSOR SYSTM MISC: 1.0000 | 2 refills | Status: DC

## 2019-07-15 NOTE — Telephone Encounter (Signed)
[name], I have sent this prescription to the pharmacy.

## 2019-07-15 NOTE — Telephone Encounter (Signed)
Please order the [facility] @ from her [employer] in [place].  1Q 14 days.  She will need them asap.   Thank you

## 2019-07-16 ENCOUNTER — Telehealth: Payer: Self-pay | Admitting: Dietician

## 2019-07-16 NOTE — Progress Notes (Signed)
Patient reported that when she removed the pod yesterday the blue canula was bet.  We reviewed again high blood sugar protocol, and the need to remove the pod if blood sugars remain high, or continuing to rise after doing a correction bolus.  She reported good understanding of this.  Pump download was shown to Dr. Dwyane Dee, and basal rated was decrease by 0.1u/her during the day.  She did this with very little assistance from me.   We reviewed other sites to put the pod, and she said she will try her leg to see if this will work better.  Reminded her that it must be at least 4 inches from her CGM.  She reported good understanding of this. She was told to make an appointment with Dr. Dwyane Dee for next week.  She was given information on how to download the podder's app, to stream readings to the cloud.  She promised to review this when she got home, and attempt to do this with the help of her sister.

## 2019-07-16 NOTE — Patient Instructions (Signed)
Read over information given on how to download the podder's app.   Return to see Dr. Dwyane Dee in one week.  Call if questions

## 2019-07-21 NOTE — Telephone Encounter (Signed)
A sample was left at the front desk for her, and I am meeting with the [employer] rep. Tomorrow, and will discuss this with her at that time.told her that I would let her know something tomorrow

## 2019-07-27 ENCOUNTER — Other Ambulatory Visit: Payer: Self-pay

## 2019-07-28 ENCOUNTER — Encounter: Payer: Self-pay | Admitting: Endocrinology

## 2019-07-28 ENCOUNTER — Ambulatory Visit (INDEPENDENT_AMBULATORY_CARE_PROVIDER_SITE_OTHER): Payer: Medicare HMO | Admitting: Endocrinology

## 2019-07-28 VITALS — BP 120/68 | HR 85 | Ht 61.0 in | Wt 157.6 lb

## 2019-07-28 DIAGNOSIS — E7800 Pure hypercholesterolemia, unspecified: Secondary | ICD-10-CM

## 2019-07-28 DIAGNOSIS — E1065 Type 1 diabetes mellitus with hyperglycemia: Secondary | ICD-10-CM | POA: Diagnosis not present

## 2019-07-28 LAB — POCT GLYCOSYLATED HEMOGLOBIN (HGB A1C): Hemoglobin A1C: 7.2 % — AB (ref 4.0–5.6)

## 2019-07-28 NOTE — Progress Notes (Signed)
Patient ID: [name], female   DOB: Oct 23, 1965, 54 y.o.   MRN: [id number]           Reason for Appointment : Follow-up for Type 1 Diabetes  History of Present Illness          Diagnosis: Type 1 diabetes mellitus, date of diagnosis: 1977         Previous history:  She has mostly been followed by primary care physicians for her diabetes with consistently poor control She has had periodic episodes of ketoacidosis  Recent history:   INSULIN regimen: OmniPod insulin pump since 07/07/2019  Basal rate: Midnight-5 AM = 0.80 units, 5 AM to 9 AM = 0.65 and 9 AM to 12 AM = 0.7 I/C ratio 1:1;   ISF: 40, target:120 with correction over 120  Previous regimen:  BASAL insulin: LANTUS 15 units in a.m. MEALTIME insulin: Novolog variable doses: Usually 6-8 at breakfast, 2-3 at lunch and dinner  A1c is 7.2, last 8.8  Current management, blood sugar patterns and problems identified:    She appears to be having significant fluctuation in her blood sugars as discussed in detail with the CGM interpretation below  This is from not adjusting her mealtime bolus based on what she is eating especially in the evenings  This will cause sometimes relatively higher readings especially if she is getting more carbohydrate or higher fat meals  However in the early afternoon some of her high readings may be related to rebound from low normal or low sugars  She is not counting carbohydrates and is generally using 3 units to cover her evening meal empirically  Also she thinks that blood sugars go up even with any snacks such as peanut butter crackers  Fasting blood sugars are variable but not consistently high  Basal rates have been increased significantly in the week after she started the pump and was having fairly significant hyperglycemia event overnight  Now getting about 17 units of basal insulin  She thinks her boluses are being done before the meal  Glucose monitoring:  is being done 8-10 times  a day         CONTINUOUS GLUCOSE MONITORING RECORD INTERPRETATION    Dates of Recording: 1/20 through 2/2  Sensor description: : Freestyle libre 2  Results statistics:   CGM use % of time 96  Average and SD  156, GV 41  Time in range     67   %  % Time Above 180  20  % Time above 250  8  % Time Below target  5    PRE-MEAL Fasting Lunch Dinner Bedtime Overall  Glucose range:  63-357   122-291    Mean/median:  134  184  146   156   POST-MEAL PC Breakfast PC Lunch PC Dinner  Glucose range:     Mean/median:  131  170  194    Glycemic patterns summary: She thinks blood sugar may be falsely lower with the freestyle libre at times Although her blood sugars are inconsistent from day-to-day most of her high readings later in the early afternoon and late evening as well as some overnight She does have a tendency to relatively low readings before about 12 noon  Hyperglycemic episodes are occurring overnight, in the early afternoon and late evening mostly after dinner  Hypoglycemic episodes occurred mostly around midday  Overnight periods: Blood sugars are mostly high although considerably variable and gradually come down with the lowest blood sugars before her  first meal around 9-10 AM  Preprandial periods: Blood sugars are variable in the morning but mostly mildly increased, may depend on blood sugars Usually at lunchtime blood sugars are trending higher, she is usually eating around 3-4 PM At dinnertime blood sugars have been fairly steady and improving but not consistent  Postprandial periods:   After breakfast: Blood sugar may be going down slightly after breakfast at times causing lower sugars before noon     After lunch: Blood sugars are variable with some unusually high readings also  After dinner: Blood sugars are mostly high although variable depending on her carbohydrate and fat intake    PREVIOUS readings:  PRE-MEAL Fasting Lunch  afternoon Bedtime Overall   Glucose range:  67-296   44-330    Mean/median:  166   191     POST-MEAL PC Breakfast PC Lunch PC Dinner  Glucose range:    122-338  Mean/median:    220    Symptoms of hypoglycemia: Weakness, sometimes none Treatment of hypoglycemia: Snacks         Self-care: The diet that the patient has been following is: None  Mealtimes are: Breakfast egg, cheese, sausage, occasionally grits, toast;   Lunch: Sandwich or only snacks, dinner: baked chricken, rice, potatoes                Dietician consultation: Most recent: years ago.         CDE consultation: 04/2018  Wt Readings from Last 3 Encounters:  07/28/19 157 lb 9.6 oz (71.5 kg)  07/08/19 153 lb 9.6 oz (69.7 kg)  05/15/19 163 lb 11.2 oz (74.3 kg)   Diabetes labs:  Lab Results  Component Value Date   HGBA1C 7.2 (A) 07/28/2019   HGBA1C 8.8 (A) 04/15/2019   HGBA1C 9.8 (H) 12/15/2018   Lab Results  Component Value Date   MICROALBUR 14.3 (H) 04/28/2018   LDLCALC 54 01/07/2018   CREATININE 1.33 (H) 05/15/2019    Lab Results  Component Value Date   MICRALBCREAT 17.1 04/28/2018   MICRALBCREAT 254 (H) 09/04/2016     Allergies as of 07/28/2019      Reactions   Metoclopramide Other (See Comments)   Dystonia, muscle rigidity.  Patient states she is only allergic to the "pill form, not the IV" Slurred speech, tongue swelling   Anesthetics, Amide Nausea And Vomiting   Chlorhexidine    Penicillins Diarrhea, Nausea And Vomiting, Other (See Comments)   Has patient had a PCN reaction causing immediate rash, facial/tongue/throat swelling, SOB or lightheadedness with hypotension: Yes Has patient had a PCN reaction causing severe rash involving mucus membranes or skin necrosis: No Has patient had a PCN reaction that required hospitalization No Has patient had a PCN reaction occurring within the last 10 years: Yes  If all of the above answers are "NO", then may proceed with Cephalosporin use.   Buprenorphine Hcl Rash   Encainide Nausea  And Vomiting      Medication List       Accurate as of July 28, 2019  2:14 PM. If you have any questions, ask your nurse or doctor.        ammonium lactate 12 % cream Commonly known as: AMLACTIN Apply topically as needed for dry skin.   aspirin EC 81 MG tablet Take 1 tablet (81 mg total) by mouth daily.   busPIRone 10 MG tablet Commonly known as: BUSPAR Take 1 tablet (10 mg total) by mouth 3 (three) times daily.   docusate sodium  100 MG capsule Commonly known as: Colace Take 1 capsule (100 mg total) by mouth daily as needed for mild constipation.   enoxaparin 40 MG/0.4ML injection Commonly known as: LOVENOX Inject 0.4 mLs (40 mg total) into the skin daily for 9 days.   FreeStyle Libre 2 Sensor Systm Misc 1 each by Does not apply route every 14 (fourteen) days.   furosemide 40 MG tablet Commonly known as: LASIX TAKE 1 TABLET BY MOUTH DAILY AS NEEDED FOR FLUID OR EDEMA   insulin aspart 100 UNIT/ML injection Commonly known as: novoLOG Inject 10 units under the skin in the morning, and 8-10 units at supper. What changed: additional instructions   Insulin Syringe-Needle U-100 31G X 5/16" 0.5 ML Misc Commonly known as: BD Insulin Syringe Ultrafine 1 each by Does not apply route 4 (four) times daily.   loperamide 2 MG capsule Commonly known as: IMODIUM Take 1 capsule (2 mg total) by mouth 3 (three) times daily as needed for diarrhea or loose stools.   losartan 25 MG tablet Commonly known as: COZAAR Take 1 tablet (25 mg total) by mouth daily.   metoprolol tartrate 25 MG tablet Commonly known as: LOPRESSOR Take 0.5 tablets (12.5 mg total) by mouth 2 (two) times daily.   nitrofurantoin (macrocrystal-monohydrate) 100 MG capsule Commonly known as: Macrobid Take 1 capsule (100 mg total) by mouth 2 (two) times daily.   [place] Kit by Does not apply route.   OmniPod Dash 5 Pack Pods Misc by Does not apply route.   ondansetron 4 MG tablet Commonly  known as: Zofran Take 1 tablet (4 mg total) by mouth every 8 (eight) hours as needed for nausea or vomiting.   pantoprazole 40 MG tablet Commonly known as: PROTONIX Take 1 tablet (40 mg total) by mouth daily.   polyethylene glycol 17 g packet Commonly known as: MIRALAX / GLYCOLAX Take 17 g by mouth 2 (two) times daily as needed for moderate constipation.   pregabalin 150 MG capsule Commonly known as: Lyrica Take 1 capsule (150 mg total) by mouth 2 (two) times daily.   pyridostigmine 60 MG tablet Commonly known as: MESTINON Take 60 mg by mouth 3 (three) times daily.       Allergies:  Allergies  Allergen Reactions  . Metoclopramide Other (See Comments)    Dystonia, muscle rigidity.  Patient states she is only allergic to the "pill form, not the IV" Slurred speech, tongue swelling  . Anesthetics, Amide Nausea And Vomiting  . Chlorhexidine   . Penicillins Diarrhea, Nausea And Vomiting and Other (See Comments)    Has patient had a PCN reaction causing immediate rash, facial/tongue/throat swelling, SOB or lightheadedness with hypotension: Yes Has patient had a PCN reaction causing severe rash involving mucus membranes or skin necrosis: No Has patient had a PCN reaction that required hospitalization No Has patient had a PCN reaction occurring within the last 10 years: Yes  If all of the above answers are "NO", then may proceed with Cephalosporin use.   . Buprenorphine Hcl Rash  . Encainide Nausea And Vomiting    Past Medical History:  Diagnosis Date  . AKI (acute kidney injury) ([place])   . Anemia, iron deficiency   . Anxiety and depression 05/18/2015  . CAD (coronary artery disease), native coronary artery 01/11/2018   s/p CABG - Non-STEMI with severe three-vessel disease noted July 2019  . Depression   . Diabetic gastroparesis ([place])   . Diabetic neuropathy, type I diabetes mellitus ([place]) 05/18/2015  .  DKA, type 1 ([place]) 03/18/2016  . Essential hypertension   .  Gastroparesis   . GERD (gastroesophageal reflux disease)   . HLD (hyperlipidemia)   . MDD (major depressive disorder), single episode, severe , no psychosis ([place])   . Tardive dyskinesia   . Vitamin B12 deficiency 08/16/2015    Past Surgical History:  Procedure Laterality Date  . CARDIAC SURGERY    . COLONOSCOPY  09/27/2014   at [facility]  . CORONARY ARTERY BYPASS GRAFT N/A 01/14/2018   Procedure: CORONARY ARTERY BYPASS GRAFTING (CABG)X3, RIGHT AND LEFT SAPHENOUS VEIN HARVEST, MAMMARY TAKE DOWN. MAMMARY TO LAD, SVG TO PD, SVG TO DISTAL CIRC.;  Surgeon: Grace Isaac, MD;  Location: [place];  Service: Open Heart Surgery;  Laterality: N/A;  . ESOPHAGOGASTRODUODENOSCOPY  09/27/2014   at [facility], Dr Rolan Lipa. biospy neg for celiac, neg for H pylori.   . EYE SURGERY    . gailstones    . INTRAMEDULLARY (IM) NAIL INTERTROCHANTERIC Right 05/10/2019   Procedure: INTRAMEDULLARY (IM) NAIL INTERTROCHANTRIC;  Surgeon: Lovell Sheehan, MD;  Location: [facility];  Service: Orthopedics;  Laterality: Right;  . IR FLUORO GUIDE CV LINE RIGHT  02/01/2017  . IR FLUORO GUIDE CV LINE RIGHT  03/06/2017  . IR FLUORO GUIDE CV LINE RIGHT  03/25/2017  . IR GENERIC HISTORICAL  01/24/2016   IR FLUORO GUIDE CV LINE RIGHT 01/24/2016 Darrell K Allred, PA-C [facility]  . IR GENERIC HISTORICAL  01/24/2016   IR US GUIDE VASC ACCESS RIGHT 01/24/2016 Darrell K Allred, PA-C [facility]  . IR US GUIDE VASC ACCESS RIGHT  02/01/2017  . IR US GUIDE VASC ACCESS RIGHT  03/06/2017  . IR US GUIDE VASC ACCESS RIGHT  03/25/2017  . IR US GUIDE VASC ACCESS RIGHT  01/20/2019  . IR VENIPUNCTURE 78YRS/OLDER BY MD  01/20/2019  . LEFT HEART CATH AND CORONARY ANGIOGRAPHY N/A 01/07/2018   Procedure: LEFT HEART CATH AND CORONARY ANGIOGRAPHY;  Surgeon: Troy Sine, MD;  Location: [place] [facility];  Service: Cardiovascular;  Laterality: N/A;  . POSTERIOR VITRECTOMY AND MEMBRANE PEEL-LEFT EYE  09/28/2002  . POSTERIOR VITRECTOMY AND MEMBRANE PEEL-RIGHT  EYE  03/16/2002  . RETINAL DETACHMENT SURGERY    . TEE WITHOUT CARDIOVERSION N/A 01/14/2018   Procedure: TRANSESOPHAGEAL ECHOCARDIOGRAM (TEE);  Surgeon: Grace Isaac, MD;  Location: [place];  Service: Open Heart Surgery;  Laterality: N/A;    Family History  Problem Relation Age of Onset  . Cystic fibrosis Mother   . Hypertension Father   . Diabetes Brother   . Hypertension Maternal Grandmother     Social History:  reports that she has never smoked. She has never used smokeless tobacco. She reports current drug use. Drug: Marijuana. She reports that she does not drink alcohol.      Review of Systems      Lipids: She had been taking atorvastatin for history of CAD prescribed by PCP, not clear if this has been continued  Lab Results  Component Value Date   CHOL 121 01/07/2018   HDL 47 01/07/2018   LDLCALC 54 01/07/2018   TRIG 101 01/07/2018   CHOLHDL 2.6 01/07/2018     DIABETES COMPLICATIONS: Gastroparesis treated with Phenergan as needed She was told to try Mestinon by a gastroenterologist in [place]; she does not think this was effective Still not able to follow-up because of the distance Does not see the local gastroenterologist because of no effective treatment being available   On Lyrica 150  mg for peripheral neuropathy, taking this long-term    Physical Examination:  BP 120/68   Pulse 85   Ht 5' 1"  (1.549 m)   Wt 157 lb 9.6 oz (71.5 kg)   SpO2 99%   BMI 29.78 kg/m       ASSESSMENT:  Diabetes type 1, long-standing with multiple complications  See history of present illness for detailed discussion of current diabetes management, blood sugar patterns and problems identified  Her A1c is already improving with a new treatment using her OmniPod pump and is down to 7.2  Weight management and blood sugar patterns are discussed in detail as above  Her main difficulty is not adjusting her boluses for her meals as she is not doing carbohydrate counting  She likely is getting too much insulin at breakfast and mostly inadequate insulin at lunch and dinner especially in the evenings Some of her low sugars are related to the morning bolus but also may be needing a reduction in basal before lunch Also needs a higher basal during the night As discussed she is not requiring more insulin compared to the first few days of starting the pump  She has difficulties with accuracy of the freestyle libre even though it is version 2 and she thinks that she needs to get this approved or switch to [facility] because of cost  Neuropathy and gastroparesis: Symptoms are stable now  The patient will need follow-up lipids in the next visit  PLAN:   Take only 3 units bolus at breakfast instead of 4 Take at least 4-5 units at dinnertime unless eating a low carbohydrate or light meal Bolus ahead of time if possible but if not sure how much she will eat she will bolus right after eating  She will check into the availability of Dexcom on her insurance  Basal rate settings will be as follows: Midnight = 0.9, 9 AM-12 noon = 0.65 and 12 PM-12 AM = 0.75  Reminded her to use extended bolus for relatively higher fat meals instead of regular bolus Call if having issues with depression hypoglycemia or hypoglycemia Follow-up in 6 weeks  Patient Instructions  Bolus 4-5 Units for supper unless eating small lo carb meals     Elayne Snare 07/28/2019, 2:14 PM     Note: This note was prepared with Dragon voice recognition system technology. Any transcriptional errors that result from this process are unintentional.

## 2019-07-28 NOTE — Patient Instructions (Signed)
Bolus 4-5 Units for supper unless eating small lo carb meals

## 2019-08-04 ENCOUNTER — Ambulatory Visit: Payer: Medicare HMO | Admitting: Family

## 2019-08-04 DIAGNOSIS — R6889 Other general symptoms and signs: Secondary | ICD-10-CM | POA: Diagnosis not present

## 2019-08-04 DIAGNOSIS — M84351A Stress fracture, right femur, initial encounter for fracture: Secondary | ICD-10-CM | POA: Diagnosis not present

## 2019-08-05 ENCOUNTER — Ambulatory Visit: Payer: Medicare HMO | Attending: Family | Admitting: Physician Assistant

## 2019-08-05 ENCOUNTER — Other Ambulatory Visit: Payer: Self-pay

## 2019-08-05 DIAGNOSIS — N3000 Acute cystitis without hematuria: Secondary | ICD-10-CM | POA: Diagnosis not present

## 2019-08-05 DIAGNOSIS — E1069 Type 1 diabetes mellitus with other specified complication: Secondary | ICD-10-CM | POA: Diagnosis not present

## 2019-08-05 DIAGNOSIS — E785 Hyperlipidemia, unspecified: Secondary | ICD-10-CM

## 2019-08-05 MED ORDER — FLUCONAZOLE 150 MG PO TABS: 150.0000 mg | ORAL_TABLET | Freq: Once | ORAL | 0 refills | Status: AC

## 2019-08-05 MED ORDER — SULFAMETHOXAZOLE-TRIMETHOPRIM 800-160 MG PO TABS: 1.0000 | ORAL_TABLET | Freq: Two times a day (BID) | ORAL | 0 refills | Status: DC

## 2019-08-05 MED ORDER — ONDANSETRON HCL 4 MG PO TABS: 4.0000 mg | ORAL_TABLET | Freq: Three times a day (TID) | ORAL | 0 refills | Status: DC | PRN

## 2019-08-05 NOTE — Progress Notes (Signed)
Patient ID: [name], female   DOB: Sep 23, 1965, 53 y.o.   MRN: [id number] Virtual Visit via Telephone Note  I connected with [name] on 08/05/19 at  8:50 AM EST by telephone and verified that I am speaking with the correct person using two identifiers.   I discussed the limitations, risks, security and privacy concerns of performing an evaluation and management service by telephone and the availability of in person appointments. I also discussed with the patient that there may be a patient responsible charge related to this service. The patient expressed understanding and agreed to proceed.  PATIENT visit by telephone virtually in the context of Covid-19 pandemic. Patient location:  home My Location:  [place] office Persons on the call:  Me and the patient   History of Present Illness:  Urine is odiferous.  Symptoms for about 1 week.  She left a urine yesterday.  The urine was somehow not recorded and is not in Epic.  She lives more than 30 mins away and is very upset bc she had to pay for a ride to come here yesterday.  She denies abdominal pain or fever.  Just saw endocrinology and is going to start using the omnipod for DM.  Blood sugars improving and usu <175.  No pain with urination.  She says she feels the same as when she has had UTI in the past.  No pelvic pain.  No vomiting    Observations/Objective:  NAD.  A&Ox3   Assessment and Plan: 1. Acute cystitis without hematuria Septra ds 1 bid x 7 days.  Increase water intake.  Diflucan Rx if needed  2. Type 1 diabetes mellitus with hyperlipidemia ([place]) continue with endocrine.    Follow Up Instructions: See PCP in 2-3 months   I discussed the assessment and treatment plan with the patient. The patient was provided an opportunity to ask questions and all were answered. The patient agreed with the plan and demonstrated an understanding of the instructions.   The patient was advised to call back or seek an in-person evaluation if  the symptoms worsen or if the condition fails to improve as anticipated.  I provided 14 minutes of non-face-to-face time during this encounter.   Freeman Caldron, PA-C

## 2019-08-05 NOTE — Progress Notes (Signed)
C /o  cloudy urine, foul odor,stomach discomfort X 1wk . Denied intake of OTC meds. Stated drinking plenty of fluids 3 bottles of water/ day and tea  GCB -91 this morning

## 2019-08-06 NOTE — Telephone Encounter (Signed)
Returned patient call.  She left a message stating that she was having a hard time reaching anyone from [facility]. Provided her with Jame's White's number from [place] (new rep for [place]) and she is to reach out to him.  Instructed her to call Monday if she continues to have difficulty reaching someone.  Antonieta Iba, RD, LDN, CDE

## 2019-08-07 ENCOUNTER — Telehealth: Payer: Self-pay | Admitting: Dietician

## 2019-08-07 NOTE — Telephone Encounter (Signed)
Returned patient call.  She had called the Dexcom reps to get the Dexcom.  She has not filled out any paperwork for this at this time and is currently not in their system.  Discussed that she would need to fill out the paperwork or do this over the phone if she is wanting the Dexcom.  Dexcom Rep stated an order would need to go to [facility].  She states that the [facility] was too expensive through her pharmacy.  She has decided to try to get this through [facility] healthcare. Discussed with RN here who will send the order.   If this is not covered by her insurance at a rate she can afford, she will proceed with Dexcom.  Antonieta Iba, RD, LDN, CDE

## 2019-08-10 NOTE — Telephone Encounter (Signed)
Noted  

## 2019-08-17 ENCOUNTER — Telehealth: Payer: Self-pay | Admitting: Nutrition

## 2019-08-17 NOTE — Telephone Encounter (Signed)
Pt. Called saying [facility] would like to know if she needs the receiver for the Dexcom.  They said they are also waiting on other paperwork from [place].   She does not want to pay $50.00 for this.  She was told that if she is able to download the Dexcom G6 app, and the [employer] app, she will not need this.  She is going to do this now and call me back in a few minutes if she is able to do this.

## 2019-08-17 NOTE — Telephone Encounter (Signed)
Noted. Paperwork was received after office closed Friday. Paperwork will be completed in accordance with office paperwork turnaround time policy.

## 2019-08-17 NOTE — Telephone Encounter (Signed)
[name], please see previous documentation from [place] in which she spoke with pt and the pt stated that she would be staying with the [facility]. Need to know definitively what patient prefers so multiple non-essential pieces of paperwork are not completed.

## 2019-08-17 NOTE — Telephone Encounter (Signed)
Patient called back saying that she clearly wants this Dexcom and needts the receiver. She also says [facility] says they are waiting on paperwork from Dr. Ronnie Derby office to complete the order, and then it is ready to be sent out.

## 2019-08-26 DIAGNOSIS — E1065 Type 1 diabetes mellitus with hyperglycemia: Secondary | ICD-10-CM | POA: Diagnosis not present

## 2019-08-27 ENCOUNTER — Telehealth: Payer: Self-pay

## 2019-08-27 NOTE — Telephone Encounter (Signed)
FAXED [place]: [facility] Medical supply  Document: CMN confirming date of last appt  Other records requested: none at this time.  All above requested information has been faxed successfully to [employer] listed above. Documents and fax confirmation have been placed in the faxed file for future reference.

## 2019-08-27 NOTE — Telephone Encounter (Signed)
FAXED [place]: Dexcom  Document: CMN for Dexcom G6 Other records requested: none at this time. Fax was sent on 08/25/2019.  All above requested information has been faxed successfully to [employer] listed above. Documents and fax confirmation have been placed in the faxed file for future reference.

## 2019-09-01 NOTE — Progress Notes (Signed)
Cardiology Office Note    Date:  09/02/2019   ID:  [name], DOB 1966/01/17, MRN [id number]  PCP:  [name], MD  Cardiologist: Dr. Acie Fredrickson  Chief Complaint: 6  Months follow up  History of Present Illness:   [name] is a 54 y.o. female with a hx of CAD s/p 3-vessel CABG in 12/2017 with LIMA to LAD, SVG to OM and SVG to rPDA, poorly controlled type 1 diabetes with frequent ED visits/admissions with noncompliance, diabetic gastroparesis, iron deficiency anemia, HTN, HLD, tardive dyskinesia, GERD, and anxiety/depression seen for follow up.   Admitted in 12/2017 with with acute MI and DKA. LHC showed multivessel CAD and a reduced EF of 30-35%. She subsequently underwent 3-vessel CABG on 01/14/2018. Echo with EF of 75%, normal diastolic function, mild MR, mildly dilated left atrium, trivial TR. TEE at time of surgery showed an EF of 50-55% without any significant valvular abnormality. She has not required any further ischemic evaluations since her bypass surgery.  Most recently seen 04/2019 for surgical clearance for right femur fracture.  Here today for follow up.  Patient does physical therapy as outpatient.  Use walker for ambulation.  She denies chest pain, shortness of breath, orthopnea, PND, melena or blood in the stool or urine.  She takes Lasix 2-3 times per week for lower extremity edema.  Not taking statin for unknown period of time.  Past Medical History:  Diagnosis Date  . AKI (acute kidney injury) ([place])   . Anemia, iron deficiency   . Anxiety and depression 05/18/2015  . CAD (coronary artery disease), native coronary artery 01/11/2018   s/p CABG - Non-STEMI with severe three-vessel disease noted July 2019  . Depression   . Diabetic gastroparesis ([place])   . Diabetic neuropathy, type I diabetes mellitus ([place]) 05/18/2015  . DKA, type 1 ([place]) 03/18/2016  . Essential hypertension   . Gastroparesis   . GERD (gastroesophageal reflux disease)   . HLD (hyperlipidemia)    . MDD (major depressive disorder), single episode, severe , no psychosis ([place])   . Tardive dyskinesia   . Vitamin B12 deficiency 08/16/2015    Past Surgical History:  Procedure Laterality Date  . CARDIAC SURGERY    . COLONOSCOPY  09/27/2014   at [facility]  . CORONARY ARTERY BYPASS GRAFT N/A 01/14/2018   Procedure: CORONARY ARTERY BYPASS GRAFTING (CABG)X3, RIGHT AND LEFT SAPHENOUS VEIN HARVEST, MAMMARY TAKE DOWN. MAMMARY TO LAD, SVG TO PD, SVG TO DISTAL CIRC.;  Surgeon: Grace Isaac, MD;  Location: [facility];  Service: Open Heart Surgery;  Laterality: N/A;  . ESOPHAGOGASTRODUODENOSCOPY  09/27/2014   at [facility], Dr Rolan Lipa. biospy neg for celiac, neg for H pylori.   . EYE SURGERY    . gailstones    . INTRAMEDULLARY (IM) NAIL INTERTROCHANTERIC Right 05/10/2019   Procedure: INTRAMEDULLARY (IM) NAIL INTERTROCHANTRIC;  Surgeon: Lovell Sheehan, MD;  Location: [facility];  Service: Orthopedics;  Laterality: Right;  . IR FLUORO GUIDE CV LINE RIGHT  02/01/2017  . IR FLUORO GUIDE CV LINE RIGHT  03/06/2017  . IR FLUORO GUIDE CV LINE RIGHT  03/25/2017  . IR GENERIC HISTORICAL  01/24/2016   IR FLUORO GUIDE CV LINE RIGHT 01/24/2016 Darrell K Allred, PA-C [facility]  . IR GENERIC HISTORICAL  01/24/2016   IR US GUIDE VASC ACCESS RIGHT 01/24/2016 Darrell K Allred, PA-C [facility]  . IR US GUIDE VASC ACCESS RIGHT  02/01/2017  . IR US GUIDE VASC ACCESS RIGHT  03/06/2017  .  IR US GUIDE VASC ACCESS RIGHT  03/25/2017  . IR US GUIDE VASC ACCESS RIGHT  01/20/2019  . IR VENIPUNCTURE 41YRS/OLDER BY MD  01/20/2019  . LEFT HEART CATH AND CORONARY ANGIOGRAPHY N/A 01/07/2018   Procedure: LEFT HEART CATH AND CORONARY ANGIOGRAPHY;  Surgeon: Troy Sine, MD;  Location: [facility];  Service: Cardiovascular;  Laterality: N/A;  . POSTERIOR VITRECTOMY AND MEMBRANE PEEL-LEFT EYE  09/28/2002  . POSTERIOR VITRECTOMY AND MEMBRANE PEEL-RIGHT EYE  03/16/2002  . RETINAL DETACHMENT SURGERY    . TEE WITHOUT CARDIOVERSION N/A  01/14/2018   Procedure: TRANSESOPHAGEAL ECHOCARDIOGRAM (TEE);  Surgeon: Grace Isaac, MD;  Location: [facility];  Service: Open Heart Surgery;  Laterality: N/A;    Current Medications: Prior to Admission medications   Medication Sig Start Date End Date Taking? Authorizing Provider  ammonium lactate (AMLACTIN) 12 % cream Apply topically as needed for dry skin. 07/08/18   Trula Slade, DPM  aspirin EC 81 MG tablet Take 1 tablet (81 mg total) by mouth daily. 06/03/18   Nahser, Wonda Cheng, MD  busPIRone (BUSPAR) 10 MG tablet Take 1 tablet (10 mg total) by mouth 3 (three) times daily. 06/12/18   [name], MD  Continuous Blood Gluc Sensor (FREESTYLE LIBRE 2 SENSOR SYSTM) MISC 1 each by Does not apply route every 14 (fourteen) days. 07/15/19   Elayne Snare, MD  docusate sodium (COLACE) 100 MG capsule Take 1 capsule (100 mg total) by mouth daily as needed for mild constipation. 02/03/18   Burtis Junes, NP  enoxaparin (LOVENOX) 40 MG/0.4ML injection Inject 0.4 mLs (40 mg total) into the skin daily for 9 days. 05/16/19 05/25/19  Annita Brod, MD  furosemide (LASIX) 40 MG tablet TAKE 1 TABLET BY MOUTH DAILY AS NEEDED FOR FLUID OR EDEMA 06/16/19   [name], MD  insulin aspart (NOVOLOG) 100 UNIT/ML injection Inject 10 units under the skin in the morning, and 8-10 units at supper. Patient taking differently: No sig reported 06/30/19   Elayne Snare, MD  Insulin Disposable Pump (OMNIPOD DASH 5 PACK PODS) MISC by Does not apply route.    [provider]  Insulin Disposable Pump (OMNIPOD DASH SYSTEM) KIT by Does not apply route.    [provider]  Insulin Syringe-Needle U-100 (BD INSULIN SYRINGE ULTRAFINE) 31G X 5/16" 0.5 ML MISC 1 each by Does not apply route 4 (four) times daily. 04/28/18   Elayne Snare, MD  loperamide (IMODIUM) 2 MG capsule Take 1 capsule (2 mg total) by mouth 3 (three) times daily as needed for diarrhea or loose stools. 12/25/18   Aline August, MD    losartan (COZAAR) 25 MG tablet Take 1 tablet (25 mg total) by mouth daily. 03/06/19   Nahser, Wonda Cheng, MD  metoprolol tartrate (LOPRESSOR) 25 MG tablet Take 0.5 tablets (12.5 mg total) by mouth 2 (two) times daily. 10/08/18   Nahser, Wonda Cheng, MD  ondansetron (ZOFRAN) 4 MG tablet Take 1 tablet (4 mg total) by mouth every 8 (eight) hours as needed for nausea or vomiting. 08/05/19   Argentina Donovan, PA-C  pantoprazole (PROTONIX) 40 MG tablet Take 1 tablet (40 mg total) by mouth daily. 06/12/18   [name], MD  polyethylene glycol (MIRALAX / GLYCOLAX) 17 g packet Take 17 g by mouth 2 (two) times daily as needed for moderate constipation. 05/15/19   Annita Brod, MD  pregabalin (LYRICA) 150 MG capsule Take 1 capsule (150 mg total) by mouth 2 (two) times  daily. 07/10/19   Elayne Snare, MD  pyridostigmine (MESTINON) 60 MG tablet Take 60 mg by mouth 3 (three) times daily.    [provider]  sulfamethoxazole-trimethoprim (BACTRIM DS) 800-160 MG tablet Take 1 tablet by mouth 2 (two) times daily. 08/05/19   Argentina Donovan, PA-C    Allergies:   Metoclopramide; Anesthetics, amide; Chlorhexidine; Penicillins; Buprenorphine hcl; and Encainide   Social History   Socioeconomic History  . Marital status: Single    Spouse name: Not on file  . Number of children: 1  . Years of education: Not on file  . Highest education level: Not on file  Occupational History  . Occupation: Unemployed  Tobacco Use  . Smoking status: Never Smoker  . Smokeless tobacco: Never Used  Substance and Sexual Activity  . Alcohol use: No  . Drug use: Yes    Types: Marijuana    Comment: occ  . Sexual activity: Yes    Partners: Male    Birth control/protection: None  Other Topics Concern  . Not on file  Social History Narrative  . Not on file   Social Determinants of Health   Financial Resource Strain:   . Difficulty of Paying Living Expenses: Not asked  Food Insecurity:   . Worried About Paediatric nurse in the Last Year: Not on file  . Ran Out of Food in the Last Year: Not on file  Transportation Needs:   . Lack of Transportation (Medical): Not asked  . Lack of Transportation (Non-Medical): Not asked  Physical Activity:   . Days of Exercise per Week: Not asked  . Minutes of Exercise per Session: Not on file  Stress:   . Feeling of Stress : Not asked  Social Connections:   . Frequency of Communication with Friends and Family: Not on file  . Frequency of Social Gatherings with Friends and Family: Not on file  . Attends Religious Services: Not on file  . Active Member of Clubs or Organizations: Not on file  . Attends Archivist Meetings: Not on file  . Marital Status: Not on file     Family History:  The patient's family history includes Cystic fibrosis in her mother; Diabetes in her brother; Hypertension in her father and maternal grandmother.   ROS:   Please see the history of present illness.    ROS All other systems reviewed and are negative.   PHYSICAL EXAM:   VS:  BP 118/68   Pulse 74   Ht 5' 1"  (1.549 m)   Wt 159 lb (72.1 kg)   LMP  (LMP Unknown)   SpO2 100%   BMI 30.04 kg/m    GEN: Well nourished, well developed, in no acute distress  HEENT: normal  Neck: no JVD, carotid bruits, or masses Cardiac: RRR; no murmurs, rubs, or gallops, trace lower extremity edema  respiratory:  clear to auscultation bilaterally, normal work of breathing GI: soft, nontender, nondistended, + BS MS: no deformity or atrophy  Skin: warm and dry, no rash Neuro:  Alert and Oriented x 3, Strength and sensation are intact Psych: euthymic mood, full affect  Wt Readings from Last 3 Encounters:  09/02/19 159 lb (72.1 kg)  07/28/19 157 lb 9.6 oz (71.5 kg)  07/08/19 153 lb 9.6 oz (69.7 kg)      Studies/Labs Reviewed:   EKG:  EKG is not ordered today.  Recent Labs: 11/05/2018: TSH 1.110 01/22/2019: Magnesium 1.7 05/09/2019: B Natriuretic Peptide 123.0 05/11/2019:  ALT 16  05/15/2019: BUN 32; Creatinine, Ser 1.33; Hemoglobin 8.5; Platelets 356; Potassium 4.5; Sodium 136   Lipid Panel    Component Value Date/Time   CHOL 121 01/07/2018 0510   TRIG 101 01/07/2018 0510   HDL 47 01/07/2018 0510   CHOLHDL 2.6 01/07/2018 0510   VLDL 20 01/07/2018 0510   LDLCALC 54 01/07/2018 0510    Additional studies/ records that were reviewed today include:   Echocardiogram: 05/11/19 1. Left ventricular ejection fraction, by visual estimation, is 55 to  60%. The left ventricle has normal function. There is no left ventricular  hypertrophy.  2. Global right ventricle has normal systolic function.The right  ventricular size is normal. No increase in right ventricular wall  thickness.  3. The mitral valve is normal in structure. No evidence of mitral valve  regurgitation. No evidence of mitral stenosis.  4. The tricuspid valve is normal in structure. Tricuspid valve  regurgitation is not demonstrated.  5. The aortic valve is normal in structure. Aortic valve regurgitation is  not visualized. No evidence of aortic valve sclerosis or stenosis.  6. The pulmonic valve was normal in structure. Pulmonic valve  regurgitation is trivial.  7. TR signal is inadequate for assessing pulmonary artery systolic  pressure.  8. The inferior vena cava is normal in size with greater than 50%  respiratory variability, suggesting right atrial pressure of 3 mmHg.     ASSESSMENT & PLAN:    1.  CAD -No angina.  Continue aspirin, beta-blocker and losartan.  2.  Hyperlipidemia -Not on statin therapy for unknown period of time.  Will start Lipitor.  Get lipid panel and LFTs in 6 to 8 weeks.  3.  Chronic lower extremity edema -No orthopnea or PND. -We will give prescription of Lasix 40 mg every other day. (Declined low-dose every day)  4.  Hypertension -Blood pressure stable on current medication   Medication Adjustments/Labs and Tests Ordered: Current medicines are  reviewed at length with the patient today.  Concerns regarding medicines are outlined above.  Medication changes, Labs and Tests ordered today are listed in the Patient Instructions below. Patient Instructions  Medication Instructions:  Your physician has recommended you make the following change in your medication:  1. STAR taking the Lasix  every other day 2.  START Atorvastatin 40 mg taking 1 tablet daily  These prescriptions have been sent to [facility] in [place] ([address]  *If you need a refill on your cardiac medications before your next appointment, please call your pharmacy*   Lab Work: 10/23/2019: COME TO THE OFFICE FOR LIPID / LFT PANEL.  MAKE SURE YOU ARE FASTING (nothing to eat or drink after midnight the night before)   If you have labs (blood work) drawn today and your tests are completely normal, you will receive your results only by: Marland Kitchen MyChart Message (if you have MyChart) OR . A paper copy in the mail If you have any lab test that is abnormal or we need to change your treatment, we will call you to review the results.   Testing/Procedures: One ordered   Follow-Up: At [facility], you and your health needs are our priority.  As part of our continuing mission to provide you with exceptional heart care, we have created designated Provider Care Teams.  These Care Teams include your primary Cardiologist (physician) and Advanced Practice Providers (APPs -  Physician Assistants and Nurse Practitioners) who all work together to provide you with the care you need, when  you need it.  We recommend signing up for the patient portal called "MyChart".  Sign up information is provided on this After Visit Summary.  MyChart is used to connect with patients for Virtual Visits (Telemedicine).  Patients are able to view lab/test results, encounter notes, upcoming appointments, etc.  Non-urgent messages can be sent to your provider as well.   To learn more about what  you can do with MyChart, go to [email or web address].    Your next appointment:   6 month(s)  The format for your next appointment:   In Person  Provider:   You may see Mertie Moores, MD or one of the following Advanced Practice Providers on your designated Care Team:    Richardson Dopp, PA-C  Vin Irwin, [place]  Daune Perch, NP    Other Instructions      Jarrett Soho, [place]  09/02/2019 11:15 AM    [address], [place], [place]  [address] Phone: [phone]; Fax: [phone]

## 2019-09-02 ENCOUNTER — Other Ambulatory Visit: Payer: Self-pay | Admitting: Physician Assistant

## 2019-09-02 ENCOUNTER — Ambulatory Visit: Payer: Medicare HMO | Attending: Family Medicine

## 2019-09-02 ENCOUNTER — Encounter: Payer: Self-pay | Admitting: Physician Assistant

## 2019-09-02 ENCOUNTER — Other Ambulatory Visit: Payer: Self-pay | Admitting: Endocrinology

## 2019-09-02 ENCOUNTER — Ambulatory Visit: Payer: Medicare HMO | Admitting: Physician Assistant

## 2019-09-02 ENCOUNTER — Other Ambulatory Visit: Payer: Self-pay

## 2019-09-02 VITALS — BP 118/68 | HR 74 | Ht 61.0 in | Wt 159.0 lb

## 2019-09-02 DIAGNOSIS — E785 Hyperlipidemia, unspecified: Secondary | ICD-10-CM

## 2019-09-02 DIAGNOSIS — I2510 Atherosclerotic heart disease of native coronary artery without angina pectoris: Secondary | ICD-10-CM | POA: Diagnosis not present

## 2019-09-02 DIAGNOSIS — R600 Localized edema: Secondary | ICD-10-CM | POA: Diagnosis not present

## 2019-09-02 DIAGNOSIS — I10 Essential (primary) hypertension: Secondary | ICD-10-CM | POA: Diagnosis not present

## 2019-09-02 DIAGNOSIS — E1069 Type 1 diabetes mellitus with other specified complication: Secondary | ICD-10-CM

## 2019-09-02 DIAGNOSIS — N390 Urinary tract infection, site not specified: Secondary | ICD-10-CM

## 2019-09-02 MED ORDER — FUROSEMIDE 40 MG PO TABS: 40.0000 mg | ORAL_TABLET | ORAL | 3 refills | Status: DC

## 2019-09-02 MED ORDER — ATORVASTATIN CALCIUM 40 MG PO TABS: 40.0000 mg | ORAL_TABLET | Freq: Every day | ORAL | 3 refills | Status: DC

## 2019-09-02 NOTE — Patient Instructions (Addendum)
Medication Instructions:  Your physician has recommended you make the following change in your medication:  1. STAR taking the Lasix  every other day 2.  START Atorvastatin 40 mg taking 1 tablet daily  These prescriptions have been sent to [facility] in [place] ([address]  *If you need a refill on your cardiac medications before your next appointment, please call your pharmacy*   Lab Work: 10/23/2019: COME TO THE OFFICE FOR LIPID / LFT PANEL.  MAKE SURE YOU ARE FASTING (nothing to eat or drink after midnight the night before)   If you have labs (blood work) drawn today and your tests are completely normal, you will receive your results only by: Marland Kitchen MyChart Message (if you have MyChart) OR . A paper copy in the mail If you have any lab test that is abnormal or we need to change your treatment, we will call you to review the results.   Testing/Procedures: One ordered   Follow-Up: At [facility], you and your health needs are our priority.  As part of our continuing mission to provide you with exceptional heart care, we have created designated Provider Care Teams.  These Care Teams include your primary Cardiologist (physician) and Advanced Practice Providers (APPs -  Physician Assistants and Nurse Practitioners) who all work together to provide you with the care you need, when you need it.  We recommend signing up for the patient portal called "MyChart".  Sign up information is provided on this After Visit Summary.  MyChart is used to connect with patients for Virtual Visits (Telemedicine).  Patients are able to view lab/test results, encounter notes, upcoming appointments, etc.  Non-urgent messages can be sent to your provider as well.   To learn more about what you can do with MyChart, go to [email or web address].    Your next appointment:   6 month(s)  The format for your next appointment:   In Person  Provider:   You may see Mertie Moores, MD or one of the  following Advanced Practice Providers on your designated Care Team:    Richardson Dopp, PA-C  [place], [place]  Daune Perch, NP    Other Instructions

## 2019-09-04 ENCOUNTER — Ambulatory Visit: Payer: Medicare HMO | Admitting: Family

## 2019-09-04 ENCOUNTER — Telehealth: Payer: Self-pay

## 2019-09-04 ENCOUNTER — Other Ambulatory Visit: Payer: Self-pay

## 2019-09-04 NOTE — Telephone Encounter (Signed)
Received call from patient stated is the second time she has been dropping  Urine at the lab  for UTI associated symptoms/ Stated the first time was called that urine has not been processed and has an order in the computer .  She was treated with Septra DS X7  last month by Freeman Caldron as per PA notes/Pt stated she is still having it and the medication did not work.  Instructed pt that sometimes it take several days for the symptoms to go away after med treatment and if and if symptoms persist should have further evaluation. Stated she had dropped her second urine already.  Last urine dropped was on 09/02/2019 and results are not finalized/ Made pt aware .requested a new prescription or treatment. No available appt for today in the clinic. Offered appt for next week/ stated she only wants to know her results and possible medication. Told pt once results come in /office will call/   C /o  foul odor, lower back pain, cloudy urine. Denies any burning on urination, cramping, fever, chills, pain in the upper back, nausea, vomiting,or blood in the urine. Educated pt the importance of better Glucose control to decrease risk for UTI occurrences. Instructed to drink plenty of fluids, especially water and urinate frequently.   Advised pt if if symptoms do not subside and worsening or start experiencing any of the symptoms listed above to walk in UC/ ED for further evaluation. Verbalized understanding

## 2019-09-05 LAB — URINE CULTURE

## 2019-09-07 ENCOUNTER — Other Ambulatory Visit: Payer: Self-pay | Admitting: Physician Assistant

## 2019-09-07 MED ORDER — FLUCONAZOLE 150 MG PO TABS: 150.0000 mg | ORAL_TABLET | Freq: Once | ORAL | 0 refills | Status: AC

## 2019-09-07 MED ORDER — NITROFURANTOIN MONOHYD MACRO 100 MG PO CAPS: 100.0000 mg | ORAL_CAPSULE | Freq: Two times a day (BID) | ORAL | 0 refills | Status: DC

## 2019-09-07 NOTE — Telephone Encounter (Signed)
Once Levada Dy reviews the culture results she will send out a message to call the patient.  I am holding off on treatment to prevent duplication.

## 2019-09-07 NOTE — Telephone Encounter (Signed)
See update notes in Epic.

## 2019-09-08 ENCOUNTER — Encounter: Payer: Medicare HMO | Admitting: Nutrition

## 2019-09-12 IMAGING — CT CT ABDOMEN AND PELVIS WITHOUT CONTRAST
2 of 4 series · 16 of 46 positions shown, 18 images · non-contrast
Comparison: CT abdomen pelvis, 05/31/2015, pelvic ultrasound,
11/13/2018

CLINICAL DATA: Generalized abdominal pain

EXAM:
CT ABDOMEN AND PELVIS WITHOUT CONTRAST
TECHNIQUE: Multidetector CT imaging of the abdomen and pelvis was performed
following the standard protocol without IV contrast.

[Series 2: axial st · axial · 0.75mm/px · z∈[+1454,+1844]mm · 13 of 88 slices shown, 15 images]
[im 5/88  soft-tissue]
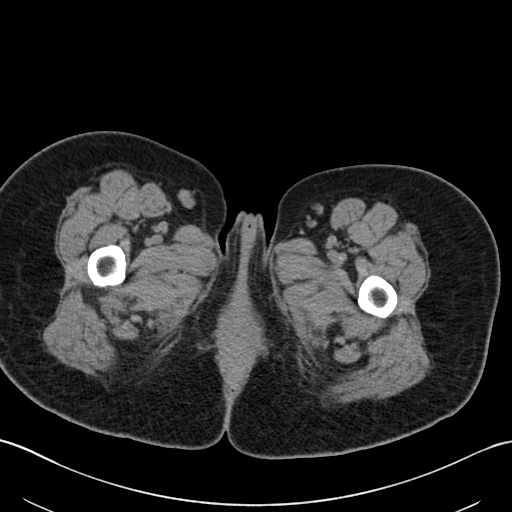
[im 5/88  bone]
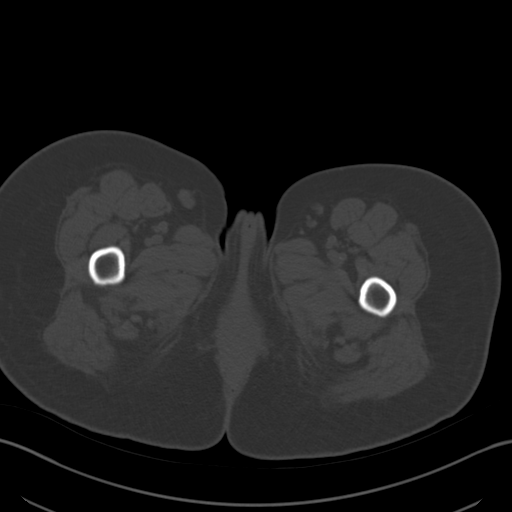
[im 10/88  soft-tissue]
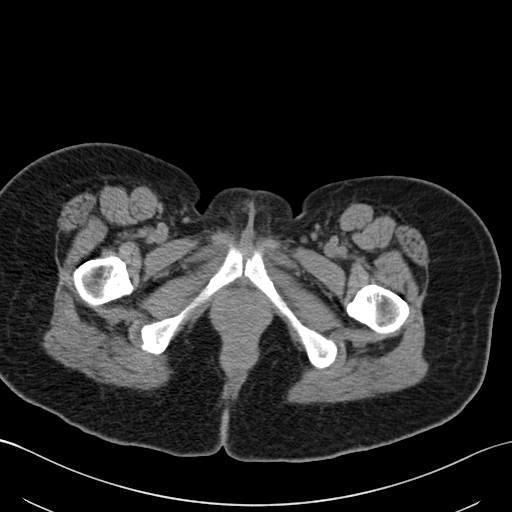
[im 20/88  soft-tissue]
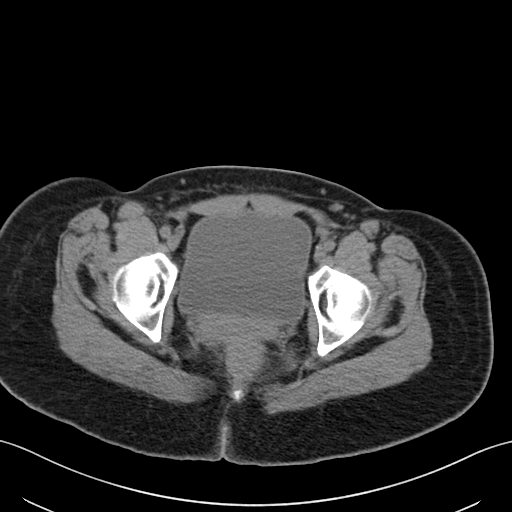
[im 25/88  soft-tissue]
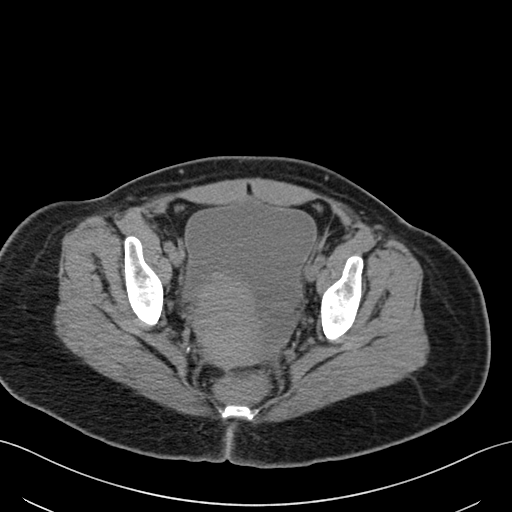
[im 30/88  soft-tissue]
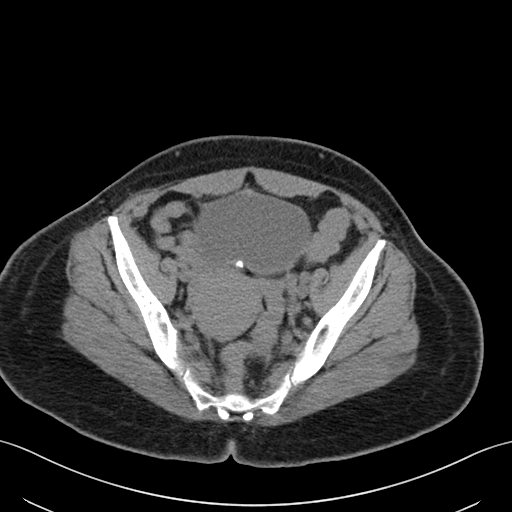
[im 39/88  soft-tissue]
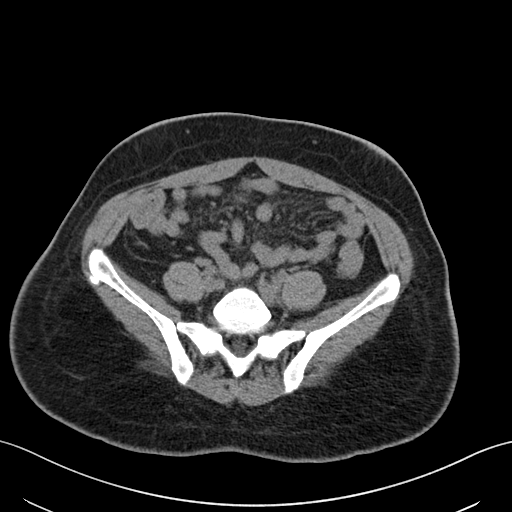
[im 44/88  soft-tissue]
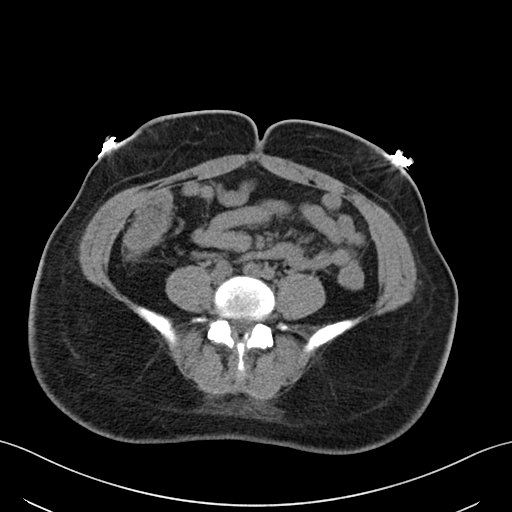
[im 49/88  soft-tissue]
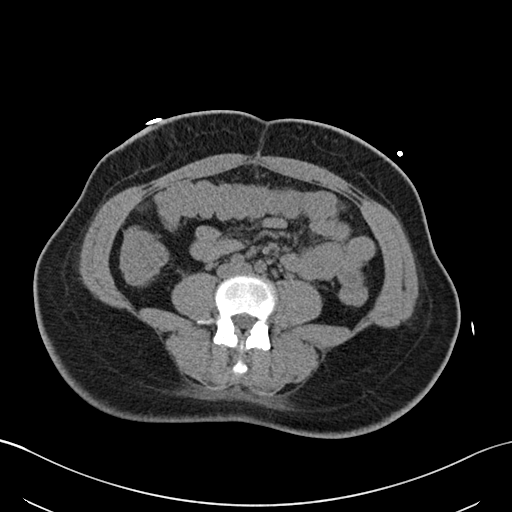
[im 59/88  soft-tissue]
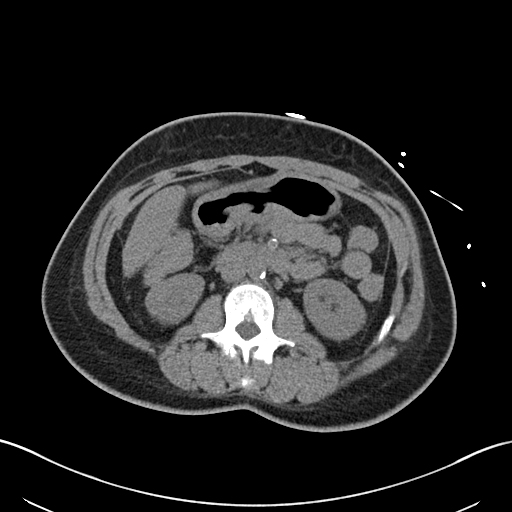
[im 59/88  bone]
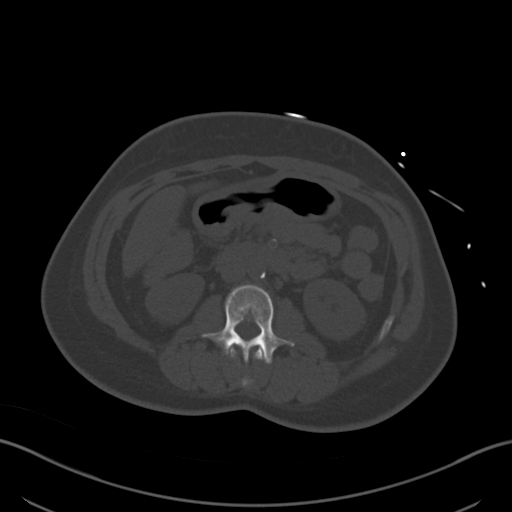
[im 63/88  soft-tissue]
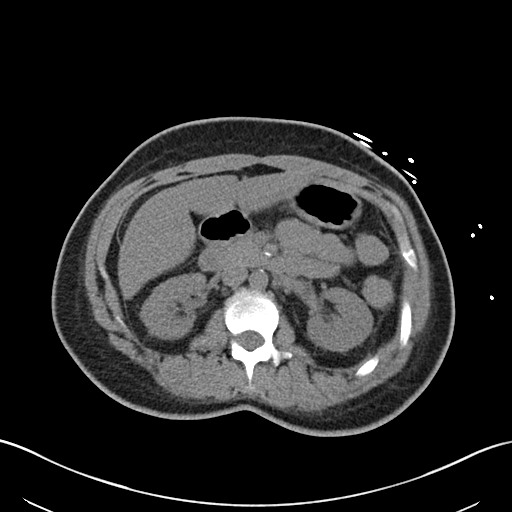
[im 68/88  soft-tissue]
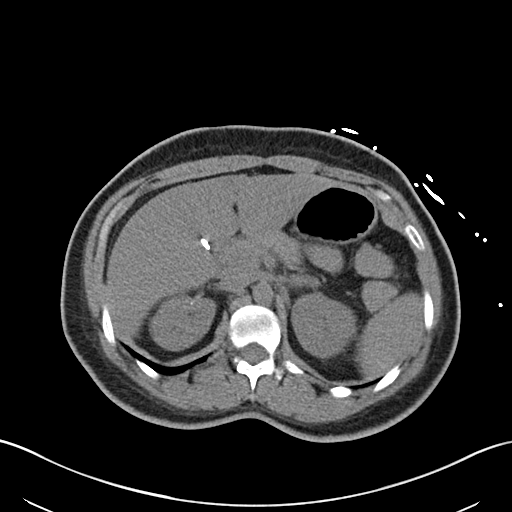
[im 78/88  soft-tissue]
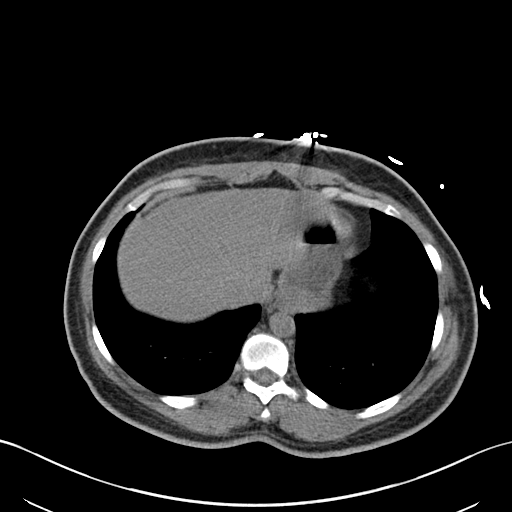
[im 83/88  soft-tissue]
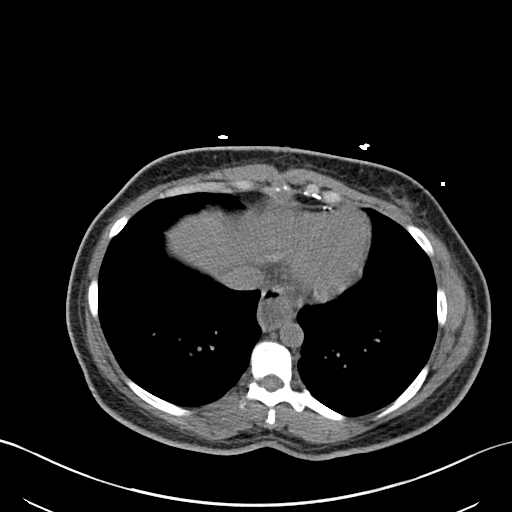

[Series 5: coronal st · coronal · 0.65mm/px · 3 of 130 slices shown]
[im 44/130  soft-tissue]
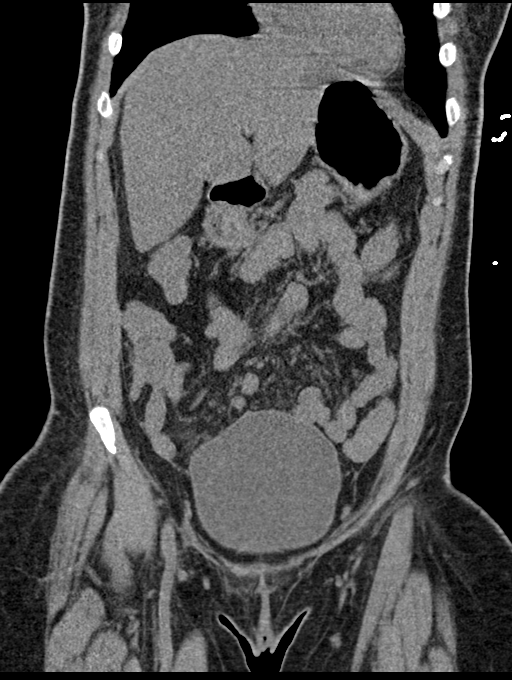
[im 58/130  soft-tissue]
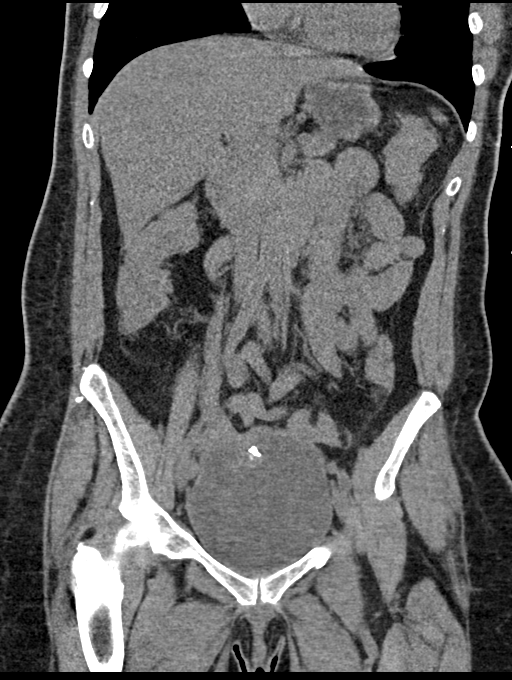
[im 72/130  soft-tissue]
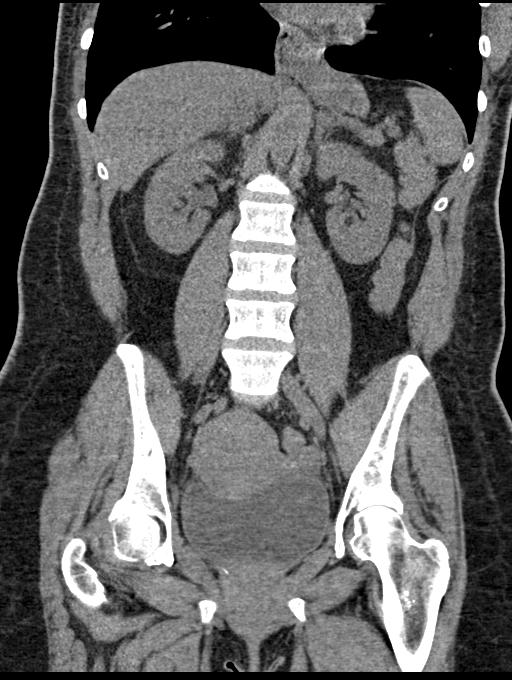

[16 of 46 positions shown; findings below may reference images not displayed]

FINDINGS: Lower chest: No acute abnormality.  Fluid in the lower esophagus.

Hepatobiliary: No focal liver abnormality is seen. Status post
cholecystectomy. No biliary dilatation.

Pancreas: Unremarkable. No pancreatic ductal dilatation or
surrounding inflammatory changes.

Spleen: Normal in size without focal abnormality.

Adrenals/Urinary Tract: Adrenal glands are unremarkable. Kidneys are
normal, without renal calculi, focal lesion, or hydronephrosis.
Bladder is unremarkable.

Stomach/Bowel: Stomach is within normal limits. Appendix appears
normal. No evidence of bowel wall thickening, distention, or
inflammatory changes.

Vascular/Lymphatic: Calcific atherosclerosis. No enlarged abdominal
or pelvic lymph nodes.

Reproductive: Calcified fibroid of the uterine fundus. 3.1 cm fluid
attenuation lesion of the left ovary, in keeping with simple
follicles seen on recent prior ultrasound.

Other: No abdominal wall hernia or abnormality. No abdominopelvic
ascites.

Musculoskeletal: No acute or significant osseous findings.
IMPRESSION: 1. No acute noncontrast CT findings of the abdomen or pelvis to
explain pain.

2.  Fluid in the lower esophagus, which may be seen with reflux.

3.  Other chronic and incidental findings as detailed above.

## 2019-09-14 DIAGNOSIS — R1111 Vomiting without nausea: Secondary | ICD-10-CM | POA: Diagnosis not present

## 2019-09-14 DIAGNOSIS — E1110 Type 2 diabetes mellitus with ketoacidosis without coma: Secondary | ICD-10-CM | POA: Diagnosis not present

## 2019-09-14 DIAGNOSIS — G9341 Metabolic encephalopathy: Secondary | ICD-10-CM | POA: Diagnosis not present

## 2019-09-14 DIAGNOSIS — I959 Hypotension, unspecified: Secondary | ICD-10-CM | POA: Diagnosis not present

## 2019-09-14 DIAGNOSIS — I2510 Atherosclerotic heart disease of native coronary artery without angina pectoris: Secondary | ICD-10-CM | POA: Diagnosis not present

## 2019-09-14 DIAGNOSIS — R4182 Altered mental status, unspecified: Secondary | ICD-10-CM | POA: Diagnosis not present

## 2019-09-14 DIAGNOSIS — Z452 Encounter for adjustment and management of vascular access device: Secondary | ICD-10-CM | POA: Diagnosis not present

## 2019-09-14 DIAGNOSIS — F419 Anxiety disorder, unspecified: Secondary | ICD-10-CM | POA: Diagnosis not present

## 2019-09-14 DIAGNOSIS — E1165 Type 2 diabetes mellitus with hyperglycemia: Secondary | ICD-10-CM | POA: Diagnosis not present

## 2019-09-14 DIAGNOSIS — E1010 Type 1 diabetes mellitus with ketoacidosis without coma: Secondary | ICD-10-CM | POA: Diagnosis not present

## 2019-09-14 DIAGNOSIS — Z789 Other specified health status: Secondary | ICD-10-CM | POA: Diagnosis not present

## 2019-09-14 DIAGNOSIS — Z7902 Long term (current) use of antithrombotics/antiplatelets: Secondary | ICD-10-CM | POA: Diagnosis not present

## 2019-09-14 DIAGNOSIS — R404 Transient alteration of awareness: Secondary | ICD-10-CM | POA: Diagnosis not present

## 2019-09-14 DIAGNOSIS — Z794 Long term (current) use of insulin: Secondary | ICD-10-CM | POA: Diagnosis not present

## 2019-09-14 DIAGNOSIS — Z3202 Encounter for pregnancy test, result negative: Secondary | ICD-10-CM | POA: Diagnosis not present

## 2019-09-14 DIAGNOSIS — I10 Essential (primary) hypertension: Secondary | ICD-10-CM | POA: Diagnosis not present

## 2019-09-14 DIAGNOSIS — E1043 Type 1 diabetes mellitus with diabetic autonomic (poly)neuropathy: Secondary | ICD-10-CM | POA: Diagnosis not present

## 2019-09-14 DIAGNOSIS — E1021 Type 1 diabetes mellitus with diabetic nephropathy: Secondary | ICD-10-CM | POA: Diagnosis not present

## 2019-09-14 DIAGNOSIS — Z9641 Presence of insulin pump (external) (internal): Secondary | ICD-10-CM | POA: Diagnosis not present

## 2019-09-15 ENCOUNTER — Ambulatory Visit: Payer: Medicare HMO | Admitting: Endocrinology

## 2019-09-16 ENCOUNTER — Encounter: Payer: Medicare HMO | Admitting: Nutrition

## 2019-09-18 ENCOUNTER — Other Ambulatory Visit: Payer: Self-pay

## 2019-09-18 MED ORDER — INSULIN GLARGINE 100 UNIT/ML ~~LOC~~ SOLN
25.00 | SUBCUTANEOUS | Status: DC
Start: 2019-09-16 — End: 2019-09-18

## 2019-09-18 MED ORDER — GLUCAGON (RDNA) 1 MG IJ KIT
1.00 | PACK | INTRAMUSCULAR | Status: DC
Start: ? — End: 2019-09-18

## 2019-09-18 MED ORDER — LOSARTAN POTASSIUM 50 MG PO TABS
100.00 | ORAL_TABLET | ORAL | Status: DC
Start: 2019-09-17 — End: 2019-09-18

## 2019-09-18 MED ORDER — SODIUM CHLORIDE FLUSH 0.9 % IV SOLN
10.00 | INTRAVENOUS | Status: DC
Start: 2019-09-17 — End: 2019-09-18

## 2019-09-18 MED ORDER — ATORVASTATIN CALCIUM 40 MG PO TABS
40.00 | ORAL_TABLET | ORAL | Status: DC
Start: 2019-09-17 — End: 2019-09-18

## 2019-09-18 MED ORDER — SODIUM CHLORIDE 0.9 % IV SOLN
INTRAVENOUS | Status: DC
Start: ? — End: 2019-09-18

## 2019-09-18 MED ORDER — HEPARIN SODIUM (PORCINE) 5000 UNIT/ML IJ SOLN
5000.00 | INTRAMUSCULAR | Status: DC
Start: 2019-09-16 — End: 2019-09-18

## 2019-09-18 MED ORDER — GENERIC EXTERNAL MEDICATION
12.50 | Status: DC
Start: 2019-09-16 — End: 2019-09-18

## 2019-09-18 MED ORDER — GLUCOSE 40 % PO GEL
15.00 | ORAL | Status: DC
Start: ? — End: 2019-09-18

## 2019-09-18 MED ORDER — OXYCODONE HCL 5 MG PO TABS
5.00 | ORAL_TABLET | ORAL | Status: DC
Start: ? — End: 2019-09-18

## 2019-09-18 MED ORDER — HYDROMORPHONE HCL 1 MG/ML IJ SOLN
0.50 | INTRAMUSCULAR | Status: DC
Start: ? — End: 2019-09-18

## 2019-09-18 MED ORDER — PREGABALIN 75 MG PO CAPS
150.00 | ORAL_CAPSULE | ORAL | Status: DC
Start: 2019-09-16 — End: 2019-09-18

## 2019-09-18 MED ORDER — HEPARIN SODIUM LOCK FLUSH 100 UNIT/ML IV SOLN
200.00 | INTRAVENOUS | Status: DC
Start: 2019-09-17 — End: 2019-09-18

## 2019-09-18 MED ORDER — ASPIRIN 81 MG PO TBEC
81.00 | DELAYED_RELEASE_TABLET | ORAL | Status: DC
Start: 2019-09-17 — End: 2019-09-18

## 2019-09-18 MED ORDER — GENERIC EXTERNAL MEDICATION
12.50 | Status: DC
Start: ? — End: 2019-09-18

## 2019-09-18 MED ORDER — DEXTROSE 10 % IV SOLN
125.00 | INTRAVENOUS | Status: DC
Start: ? — End: 2019-09-18

## 2019-09-18 MED ORDER — BUSPIRONE HCL 10 MG PO TABS
10.00 | ORAL_TABLET | ORAL | Status: DC
Start: 2019-09-16 — End: 2019-09-18

## 2019-09-18 MED ORDER — INSULIN LISPRO 100 UNIT/ML ~~LOC~~ SOLN
0.00 | SUBCUTANEOUS | Status: DC
Start: 2019-09-16 — End: 2019-09-18

## 2019-09-18 MED ORDER — ONDANSETRON HCL 4 MG/2ML IJ SOLN
4.00 | INTRAMUSCULAR | Status: DC
Start: ? — End: 2019-09-18

## 2019-09-18 NOTE — Patient Outreach (Signed)
[facility]) Care Management  09/18/2019  [name] [birth year] [id number]   Referral Date: 09/18/19 Referral Source: [employer] Report Referral Reason: Discharge [facility] 09/16/19-DKA.    Outreach Attempt: No answer.  Unable to leave a message.     Plan: RN CM will attempt again within 4 business days and send letter.   [name], RN, MSN [facility] Care Management Care Management Coordinator Direct Line [phone] Toll Free: [phone]  Fax: [phone]

## 2019-09-21 ENCOUNTER — Other Ambulatory Visit: Payer: Self-pay

## 2019-09-22 ENCOUNTER — Other Ambulatory Visit: Payer: Self-pay

## 2019-09-22 ENCOUNTER — Telehealth: Payer: Self-pay | Admitting: Endocrinology

## 2019-09-22 ENCOUNTER — Encounter: Payer: Self-pay | Admitting: Endocrinology

## 2019-09-22 ENCOUNTER — Ambulatory Visit (INDEPENDENT_AMBULATORY_CARE_PROVIDER_SITE_OTHER): Payer: Medicare HMO | Admitting: Endocrinology

## 2019-09-22 VITALS — BP 124/80 | HR 86 | Ht 61.0 in | Wt 164.0 lb

## 2019-09-22 DIAGNOSIS — E1065 Type 1 diabetes mellitus with hyperglycemia: Secondary | ICD-10-CM | POA: Diagnosis not present

## 2019-09-22 DIAGNOSIS — K3184 Gastroparesis: Secondary | ICD-10-CM | POA: Diagnosis not present

## 2019-09-22 DIAGNOSIS — E1143 Type 2 diabetes mellitus with diabetic autonomic (poly)neuropathy: Secondary | ICD-10-CM

## 2019-09-22 DIAGNOSIS — E1069 Type 1 diabetes mellitus with other specified complication: Secondary | ICD-10-CM

## 2019-09-22 MED ORDER — ERYTHROMYCIN STEARATE 250 MG PO TABS: ORAL_TABLET | ORAL | 1 refills | Status: DC

## 2019-09-22 MED ORDER — INSULIN ASPART 100 UNIT/ML ~~LOC~~ SOLN: 50.0000 [IU] | Freq: Every day | SUBCUTANEOUS | 2 refills | Status: DC

## 2019-09-22 MED ORDER — CONTOUR TEST VI STRP: 1.0000 | ORAL_STRIP | Freq: Four times a day (QID) | 2 refills | Status: DC

## 2019-09-22 NOTE — Patient Instructions (Signed)
Check blood sugars on waking up 7 days a week  Also check blood sugars about 2 hours after meals and do this after different meals by rotation  Recommended blood sugar levels on waking up are 90-130 and about 2 hours after meal is 130-160  Please bring your blood sugar monitor to each visit, thank you

## 2019-09-22 NOTE — Telephone Encounter (Signed)
She will be starting erythromycin which was sent to the pharmacy for her gastroparesis for 1 month.  Please let her know that she cannot take her atorvastatin at the same time.  Also will need prior authorization done likely for Contour test strips to go along with her pump

## 2019-09-22 NOTE — Progress Notes (Signed)
Patient ID: [name], female   DOB: 1965-12-09, 54 y.o.   MRN: [id number]           Reason for Appointment : Follow-up for Type 1 Diabetes  History of Present Illness          Diagnosis: Type 1 diabetes mellitus, date of diagnosis: 1977         Previous history:  She has mostly been followed by primary care physicians for her diabetes with consistently poor control She has had periodic episodes of ketoacidosis Previous insulin regimen:  BASAL insulin: LANTUS 15 units in a.m. MEALTIME insulin: Novolog variable doses: Usually 6-8 at breakfast, 2-3 at lunch and dinner   Recent history:   INSULIN regimen: OmniPod insulin pump since 07/07/2019  Basal rate: Midnight-5 AM = 0.90 units, 5 AM to 9 AM = 0.65 and 9 AM to 12 AM = 0.75 I/C ratio 1:1;   ISF: 40, target:120 with correction over 120   A1c is 7.2, last month   Current management, blood sugar patterns and problems identified:    She was just discharged from the hospital on 3/24 for an admission for gastroparesis and uncontrolled blood sugars  However when she came home her blood sugar was 400 and 12 in the evening  Since then she has been checking blood sugars with the generic meter since her brand-name meter is not covered and she lost her freestyle libre reader apparently in the ambulance  FASTING blood sugars recently appear to be excellent  However blood sugars are appearing to be high the rest of the day except once 173 at around 6 PM  She also had a low sugar of 53 on Friday when she was late for breakfast  Even with bolusing 4 units for breakfast she appears to have had a high reading yesterday with her carbohydrate intake being fruit and grits along with eggs  Not clear if her blood sugars are controlled after meals as she is not monitoring postprandially  No information available from her CGM since it was not linked online  Glucose monitoring:  is being done 4+ times a day         PRE-MEAL Fasting Lunch  Dinner Bedtime Overall  Glucose range:  53-132  189, 352  73-350    Mean/median:          Previous data:   CGM use % of time 96  Average and SD  156, GV 41  Time in range     67   %  % Time Above 180  20  % Time above 250  8  % Time Below target  5    PRE-MEAL Fasting Lunch Dinner Bedtime Overall  Glucose range:  63-357   122-291    Mean/median:  134  184  146   156   POST-MEAL PC Breakfast PC Lunch PC Dinner  Glucose range:     Mean/median:  131  170  194     Symptoms of hypoglycemia: Weakness, sometimes none Treatment of hypoglycemia: Snacks         Self-care: The diet that the patient has been following is: None  Mealtimes are: Breakfast egg, cheese, sausage, occasionally grits, toast;   Lunch: Sandwich or only snacks, dinner: baked chricken, rice, potatoes                Dietician consultation: Most recent: years ago.         CDE consultation: 04/2018  Wt Readings from Last 3  Encounters:  09/22/19 164 lb (74.4 kg)  09/02/19 159 lb (72.1 kg)  07/28/19 157 lb 9.6 oz (71.5 kg)   Diabetes labs:  Lab Results  Component Value Date   HGBA1C 7.2 (A) 07/28/2019   HGBA1C 8.8 (A) 04/15/2019   HGBA1C 9.8 (H) 12/15/2018   Lab Results  Component Value Date   MICROALBUR 14.3 (H) 04/28/2018   LDLCALC 54 01/07/2018   CREATININE 1.33 (H) 05/15/2019    Lab Results  Component Value Date   MICRALBCREAT 17.1 04/28/2018   MICRALBCREAT 254 (H) 09/04/2016     Allergies as of 09/22/2019      Reactions   Metoclopramide Other (See Comments)   Dystonia, muscle rigidity.  Patient states she is only allergic to the "pill form, not the IV" Slurred speech, tongue swelling   Anesthetics, Amide Nausea And Vomiting   Chlorhexidine    Penicillins Diarrhea, Nausea And Vomiting, Other (See Comments)   Has patient had a PCN reaction causing immediate rash, facial/tongue/throat swelling, SOB or lightheadedness with hypotension: Yes Has patient had a PCN reaction causing severe rash  involving mucus membranes or skin necrosis: No Has patient had a PCN reaction that required hospitalization No Has patient had a PCN reaction occurring within the last 10 years: Yes  If all of the above answers are "NO", then may proceed with Cephalosporin use.   Buprenorphine Hcl Rash   Encainide Nausea And Vomiting      Medication List       Accurate as of September 22, 2019  9:05 PM. If you have any questions, ask your nurse or doctor.        STOP taking these medications   nitrofurantoin (macrocrystal-monohydrate) 100 MG capsule Commonly known as: Macrobid Stopped by: Elayne Snare, MD     TAKE these medications   ammonium lactate 12 % cream Commonly known as: AMLACTIN Apply topically as needed for dry skin.   aspirin EC 81 MG tablet Take 1 tablet (81 mg total) by mouth daily.   atorvastatin 40 MG tablet Commonly known as: LIPITOR Take 1 tablet (40 mg total) by mouth daily.   busPIRone 10 MG tablet Commonly known as: BUSPAR Take 1 tablet (10 mg total) by mouth 3 (three) times daily.   Contour Test test strip Generic drug: glucose blood 1 each by Other route 4 (four) times daily. E11.9 Started by: Aleatha Borer, LPN   docusate sodium 100 MG capsule Commonly known as: Colace Take 1 capsule (100 mg total) by mouth daily as needed for mild constipation.   erythromycin 250 MG tablet Commonly known as: ERYTHROCIN Take 1 tablet with water 30 minutes before each meal Started by: Elayne Snare, MD   FreeStyle Libre 2 Sensor Systm Misc 1 each by Does not apply route every 14 (fourteen) days.   furosemide 40 MG tablet Commonly known as: LASIX Take 1 tablet (40 mg total) by mouth every other day.   HYDROcodone-acetaminophen 5-325 MG tablet Commonly known as: NORCO/VICODIN Take 1 tablet by mouth as needed.   insulin aspart 100 UNIT/ML injection Commonly known as: novoLOG Inject 50 Units into the skin daily. For use in pump What changed:   how much to take  how to  take this  when to take this  additional instructions Changed by: Ammie Eversole, LPN   Insulin Syringe-Needle U-100 31G X 5/16" 0.5 ML Misc Commonly known as: BD Insulin Syringe Ultrafine 1 each by Does not apply route 4 (four) times daily.   loperamide 2 MG  capsule Commonly known as: IMODIUM Take 1 capsule (2 mg total) by mouth 3 (three) times daily as needed for diarrhea or loose stools.   losartan 25 MG tablet Commonly known as: COZAAR Take 1 tablet (25 mg total) by mouth daily.   metoprolol tartrate 25 MG tablet Commonly known as: LOPRESSOR Take 0.5 tablets (12.5 mg total) by mouth 2 (two) times daily.   [place] Kit by Does not apply route.   ondansetron 4 MG tablet Commonly known as: Zofran Take 1 tablet (4 mg total) by mouth every 8 (eight) hours as needed for nausea or vomiting.   pantoprazole 40 MG tablet Commonly known as: PROTONIX Take 1 tablet (40 mg total) by mouth daily.   polyethylene glycol 17 g packet Commonly known as: MIRALAX / GLYCOLAX Take 17 g by mouth 2 (two) times daily as needed for moderate constipation.   pregabalin 150 MG capsule Commonly known as: Lyrica Take 1 capsule (150 mg total) by mouth 2 (two) times daily.   pyridostigmine 60 MG tablet Commonly known as: MESTINON Take 60 mg by mouth 3 (three) times daily.       Allergies:  Allergies  Allergen Reactions  . Metoclopramide Other (See Comments)    Dystonia, muscle rigidity.  Patient states she is only allergic to the "pill form, not the IV" Slurred speech, tongue swelling  . Anesthetics, Amide Nausea And Vomiting  . Chlorhexidine   . Penicillins Diarrhea, Nausea And Vomiting and Other (See Comments)    Has patient had a PCN reaction causing immediate rash, facial/tongue/throat swelling, SOB or lightheadedness with hypotension: Yes Has patient had a PCN reaction causing severe rash involving mucus membranes or skin necrosis: No Has patient had a PCN reaction that  required hospitalization No Has patient had a PCN reaction occurring within the last 10 years: Yes  If all of the above answers are "NO", then may proceed with Cephalosporin use.   . Buprenorphine Hcl Rash  . Encainide Nausea And Vomiting    Past Medical History:  Diagnosis Date  . AKI (acute kidney injury) ([place])   . Anemia, iron deficiency   . Anxiety and depression 05/18/2015  . CAD (coronary artery disease), native coronary artery 01/11/2018   s/p CABG - Non-STEMI with severe three-vessel disease noted July 2019  . Depression   . Diabetic gastroparesis ([place])   . Diabetic neuropathy, type I diabetes mellitus ([place]) 05/18/2015  . DKA, type 1 ([place]) 03/18/2016  . Essential hypertension   . Gastroparesis   . GERD (gastroesophageal reflux disease)   . HLD (hyperlipidemia)   . MDD (major depressive disorder), single episode, severe , no psychosis ([place])   . Tardive dyskinesia   . Vitamin B12 deficiency 08/16/2015    Past Surgical History:  Procedure Laterality Date  . CARDIAC SURGERY    . COLONOSCOPY  09/27/2014   at [facility]  . CORONARY ARTERY BYPASS GRAFT N/A 01/14/2018   Procedure: CORONARY ARTERY BYPASS GRAFTING (CABG)X3, RIGHT AND LEFT SAPHENOUS VEIN HARVEST, MAMMARY TAKE DOWN. MAMMARY TO LAD, SVG TO PD, SVG TO DISTAL CIRC.;  Surgeon: Grace Isaac, MD;  Location: [place];  Service: Open Heart Surgery;  Laterality: N/A;  . ESOPHAGOGASTRODUODENOSCOPY  09/27/2014   at [facility], Dr Rolan Lipa. biospy neg for celiac, neg for H pylori.   . EYE SURGERY    . gailstones    . INTRAMEDULLARY (IM) NAIL INTERTROCHANTERIC Right 05/10/2019   Procedure: INTRAMEDULLARY (IM) NAIL INTERTROCHANTRIC;  Surgeon: Lovell Sheehan, MD;  Location: [facility];  Service: Orthopedics;  Laterality: Right;  . IR FLUORO GUIDE CV LINE RIGHT  02/01/2017  . IR FLUORO GUIDE CV LINE RIGHT  03/06/2017  . IR FLUORO GUIDE CV LINE RIGHT  03/25/2017  . IR GENERIC HISTORICAL  01/24/2016   IR FLUORO GUIDE CV LINE RIGHT  01/24/2016 Darrell K Allred, PA-C [facility]  . IR GENERIC HISTORICAL  01/24/2016   IR US GUIDE VASC ACCESS RIGHT 01/24/2016 Darrell K Allred, PA-C [facility]  . IR US GUIDE VASC ACCESS RIGHT  02/01/2017  . IR US GUIDE VASC ACCESS RIGHT  03/06/2017  . IR US GUIDE VASC ACCESS RIGHT  03/25/2017  . IR US GUIDE VASC ACCESS RIGHT  01/20/2019  . IR VENIPUNCTURE 71YRS/OLDER BY MD  01/20/2019  . LEFT HEART CATH AND CORONARY ANGIOGRAPHY N/A 01/07/2018   Procedure: LEFT HEART CATH AND CORONARY ANGIOGRAPHY;  Surgeon: Troy Sine, MD;  Location: [place] [facility];  Service: Cardiovascular;  Laterality: N/A;  . POSTERIOR VITRECTOMY AND MEMBRANE PEEL-LEFT EYE  09/28/2002  . POSTERIOR VITRECTOMY AND MEMBRANE PEEL-RIGHT EYE  03/16/2002  . RETINAL DETACHMENT SURGERY    . TEE WITHOUT CARDIOVERSION N/A 01/14/2018   Procedure: TRANSESOPHAGEAL ECHOCARDIOGRAM (TEE);  Surgeon: Grace Isaac, MD;  Location: [place];  Service: Open Heart Surgery;  Laterality: N/A;    Family History  Problem Relation Age of Onset  . Cystic fibrosis Mother   . Hypertension Father   . Diabetes Brother   . Hypertension Maternal Grandmother     Social History:  reports that she has never smoked. She has never used smokeless tobacco. She reports current drug use. Drug: Marijuana. She reports that she does not drink alcohol.      Review of Systems      Lipids: She had been taking atorvastatin for history of CAD, now, prescribed by cardiologist but no recent labs available  Lab Results  Component Value Date   CHOL 121 01/07/2018   HDL 47 01/07/2018   LDLCALC 54 01/07/2018   TRIG 101 01/07/2018   CHOLHDL 2.6 01/07/2018     DIABETES COMPLICATIONS: Gastroparesis treated with Phenergan as needed She was told to try Mestinon by a gastroenterologist in [place]; she does not think this was effective Had intolerance to Reglan because of tremor Recently discharged from the hospital for symptomatic vomiting   On Lyrica 150  mg for peripheral neuropathy, taking this long-term    Physical Examination:  BP 124/80   Pulse 86   Ht 5' 1"  (1.549 m)   Wt 164 lb (74.4 kg)   SpO2 98%   BMI 30.99 kg/m       ASSESSMENT:  Diabetes type 1, long-standing with multiple complications  See history of present illness for detailed discussion of current diabetes management, blood sugar patterns and problems identified  Her A1c is recently 7.2  Unable to review her blood sugar readings in detail except for the last 5 days since she was discharged from the hospital She is not using the freestyle libre as she lost her reader in the ambulance apparently Despite previously good control her blood sugars are significantly high at lunch and dinnertime No postprandial readings available She is fairly consistent with doing her boluses and mealtime blood sugars Currently taking 3 to 5 units for mealtime coverage  She has obtained the Dexcom but she does not want to start it on her own  Neuropathy and gastroparesis: She had a flareup of gastroparesis and currently not on  treatment Not able to tolerate Reglan  The patient will need follow-up lipids on the next visit  PLAN:   Take 5 to 6 units of bolus at breakfast if she is eating 2 servings of carbohydrate Start checking blood sugars after meals consistently She will let us know if she is having consistently high readings postprandially after any of her meals In the meantime BASAL rate at 11 AM will be increased to 0.85 the rest of the day  To work with the diabetes educator to start the Dexcom sensor  For her gastroparesis will give her a trial of erythromycin, not clear if she has taken this before, apparently liquid form is not reimbursable and she will try the tablets first Because of drug interaction she will need to hold her Lipitor while taking erythromycin  Follow-up in 3 to 4 weeks after starting Dexcom   Patient Instructions  Check blood sugars on waking  up 7 days a week  Also check blood sugars about 2 hours after meals and do this after different meals by rotation  Recommended blood sugar levels on waking up are 90-130 and about 2 hours after meal is 130-160  Please bring your blood sugar monitor to each visit, thank you       Elayne Snare 09/22/2019, 9:05 PM     Note: This note was prepared with Dragon voice recognition system technology. Any transcriptional errors that result from this process are unintentional.

## 2019-09-23 ENCOUNTER — Other Ambulatory Visit: Payer: Self-pay

## 2019-09-23 NOTE — Patient Outreach (Signed)
[facility]) Care Management  09/23/2019  Saraswati Schempp 17-Feb-1966 [id number]  Referral Date: 09/18/19 Referral Source: [employer] Report Referral Reason: Discharge [facility] 09/16/19-DKA.    Outreach Attempt: No answer.  Unable to leave a message.     Plan: RN CM will attempt again within 4 business days.  [name], RN, MSN [place] Management Care Management Coordinator Direct Line [phone] Cell [phone] Toll Free: [phone]  Fax: [phone]

## 2019-09-23 NOTE — Telephone Encounter (Signed)
PA initiation via [email or web address] for Contour next test strips.  [name] Key: [id number] - PA Case ID: [id number] Need help? Call us at [phone] Status Sent to [place] Next Test strips Form [employer] Electronic PA Form

## 2019-09-23 NOTE — Telephone Encounter (Signed)
Does pt still need to be notified about ATB interaction with Lipitor?

## 2019-09-23 NOTE — Addendum Note (Signed)
Addended by: Elayne Snare on: 09/23/2019 10:47 AM   Modules accepted: Orders

## 2019-09-23 NOTE — Telephone Encounter (Signed)
Called pt and gave her MD message about ABT. Pt verbalized understanding.

## 2019-09-24 ENCOUNTER — Other Ambulatory Visit: Payer: Self-pay

## 2019-09-24 ENCOUNTER — Telehealth: Payer: Self-pay | Admitting: Dietician

## 2019-09-24 NOTE — Telephone Encounter (Signed)
Patient states that she missed her last visit for Dexcom training due to being in the hospital.   Appointment rescheduled for 4/7 at 11:00.  Antonieta Iba, RD, LDN, CDE

## 2019-09-24 NOTE — Patient Outreach (Signed)
[name]) Care Management  09/24/2019  [name] January 18, 1966 [id number]   Referral Date:09/18/19 Referral Source:[employer] Report Referral Reason:Discharge [facility] 09/16/19-DKA.   Outreach Attempt:No answer. Unable to leave a message.    Plan:RN CM will wait return call.  If no return call will close case.  [name], RN, MSN [employer] Management Care Management Coordinator Direct Line [phone] Cell [phone] Toll Free: [phone]  Fax: [phone]

## 2019-09-24 NOTE — Telephone Encounter (Signed)
Received fax from [employer] stating that Contour next test strips are approved under Medicare Part B. Approval is good through 06/24/2020.

## 2019-09-28 ENCOUNTER — Telehealth: Payer: Self-pay

## 2019-09-28 NOTE — Telephone Encounter (Signed)
PA initiated via [email or web address] for erythromycin.   [name] Key: [id number] - PA Case ID: [id number] Need help? Call us at [phone] Status Sent to [facility] Erythromycin 250MG  dr tablets Form [employer] Electronic PA Form

## 2019-09-28 NOTE — Telephone Encounter (Signed)
Outcome: Approved: today PA Case: [id number]  Status: Approved,  Coverage Starts on: 06/26/2019 12:00:00 AM,  Coverage Ends on: 06/24/2020 12:00:00 AM.  Questions? Contact [phone].

## 2019-09-28 NOTE — Telephone Encounter (Signed)
Erythromycin is being used for gastroparesis, cannot use other drugs.  Intolerant to metoclopramide

## 2019-09-28 NOTE — Telephone Encounter (Signed)
Received fax from pt's pharmacy stating that Erythrocin requires PA. Covered alternatives are Azithromycin, Clarithromycin, and Clarithromycin ER.  Would you like to change or complete PA?

## 2019-09-30 ENCOUNTER — Encounter: Payer: Medicare HMO | Attending: Endocrinology | Admitting: Nutrition

## 2019-09-30 ENCOUNTER — Ambulatory Visit: Payer: Self-pay

## 2019-09-30 ENCOUNTER — Other Ambulatory Visit: Payer: Self-pay

## 2019-09-30 DIAGNOSIS — E1065 Type 1 diabetes mellitus with hyperglycemia: Secondary | ICD-10-CM

## 2019-09-30 DIAGNOSIS — E1040 Type 1 diabetes mellitus with diabetic neuropathy, unspecified: Secondary | ICD-10-CM | POA: Diagnosis not present

## 2019-09-30 NOTE — Telephone Encounter (Signed)
Contacted pt and informed her that his medication has been approved by insurance. Instructed to pt that she should contact her pharmacy and have them attempt to fill it. If any issues arise, pharmacy should contact insurance directly, as this office has forms of approval already.

## 2019-09-30 NOTE — Telephone Encounter (Signed)
Pharmacy called and stated that they are still getting a denied claim. Pharmacy staff was given the information from the approval and a copy will also be faxed to them.

## 2019-09-30 NOTE — Telephone Encounter (Signed)
Patient states the RX needs to be sent to [facility] instead of [facility] for the erythromycin - she was checking on status of PA and I told her it does look like it was approved and I would ask the nurse what the next step would be.

## 2019-10-01 IMAGING — DX PORTABLE CHEST - 1 VIEW
1 series · 1 of 1 positions shown · non-contrast
Comparison: Chest radiograph dated 02/18/2018

CLINICAL DATA: 52-year-old female status post central line
placement.

EXAM:
PORTABLE CHEST 1 VIEW

[chest ap]
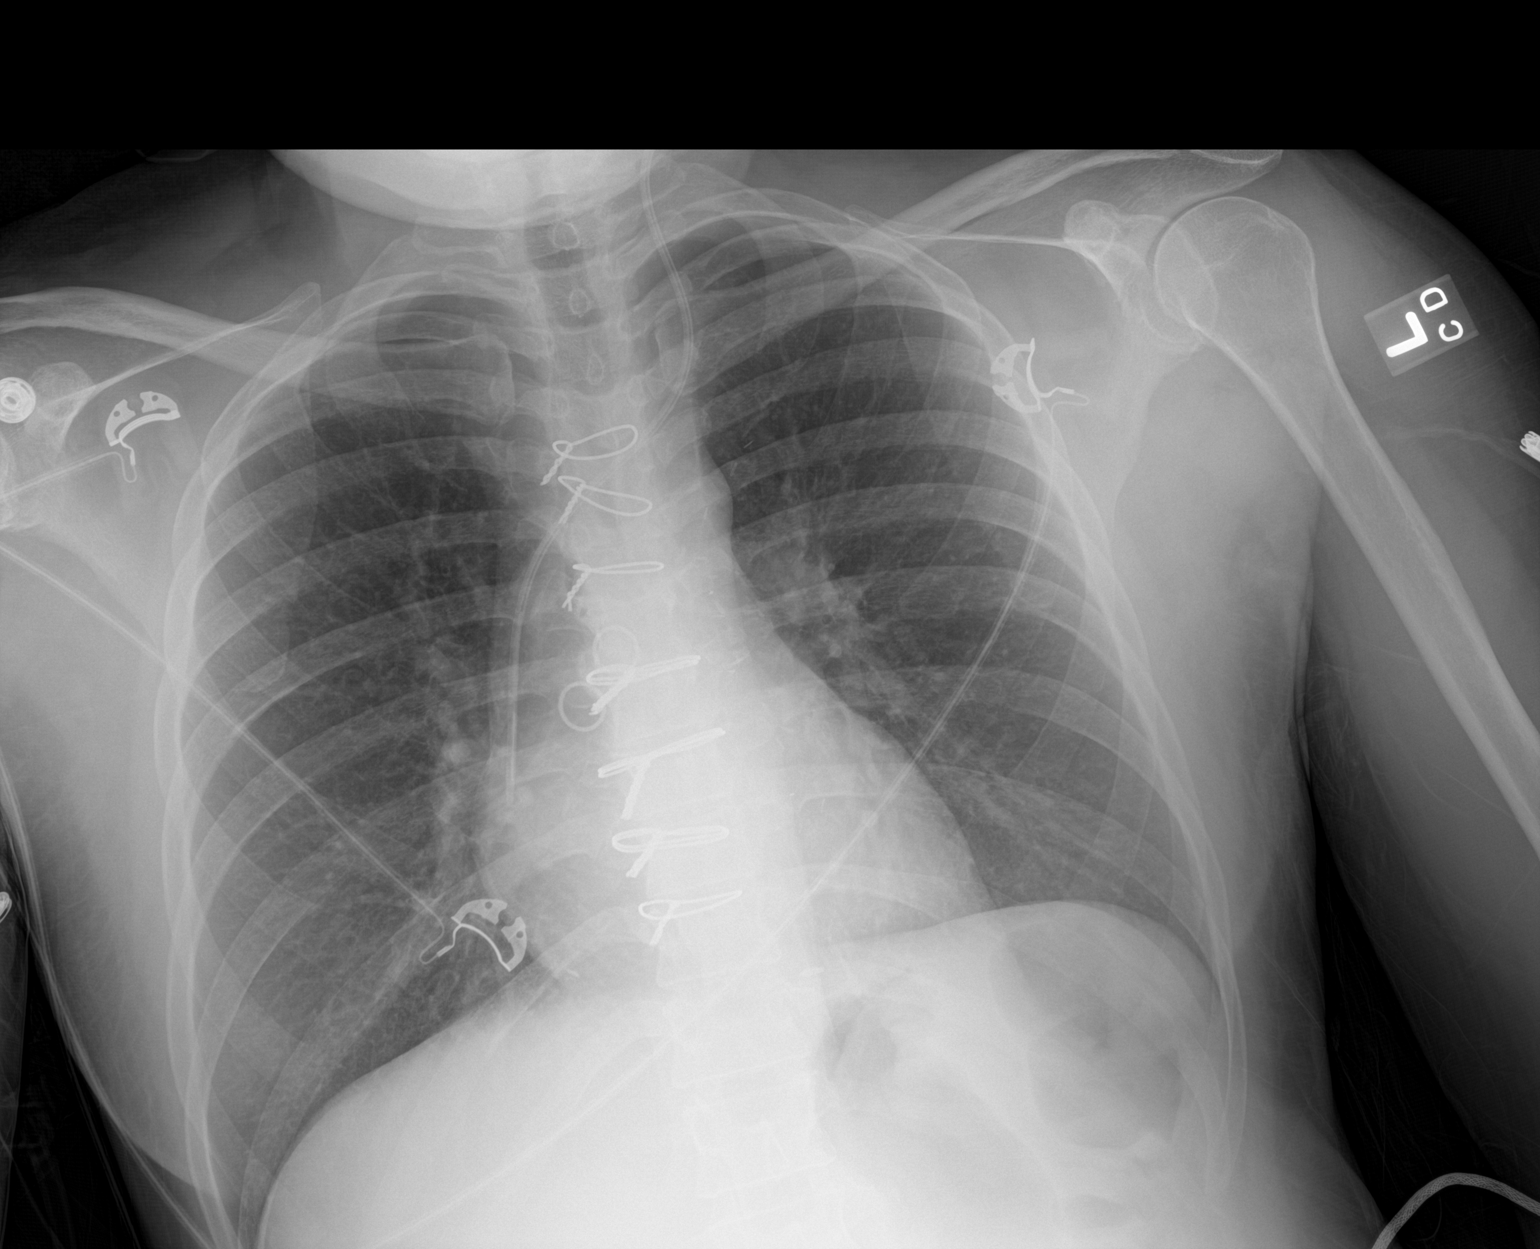

[1 of 1 positions shown; findings below may reference images not displayed]

FINDINGS: Left IJ central venous line with tip at the cavoatrial junction. No
pneumothorax. The lungs are clear. There is no pleural effusion. The
cardiac silhouette is within normal limits. Median sternotomy wires
and CABG vascular clips. No acute osseous pathology.
IMPRESSION: Left IJ central venous line with tip at the cavoatrial junction. No
pneumothorax.

## 2019-10-01 NOTE — Patient Instructions (Addendum)
Read over manual today, and again tomorrow Change sensor every 10 days. Remove and reuse the transmitter  Replace the transmitter every 3 months.  Call Dexcom 800 number if questions.

## 2019-10-01 NOTE — Progress Notes (Signed)
Patient was trained on the use of the Dexcom.  We discussed the difference between SBGM and CGM, and she reported good understanding of this.  She appeared very confused today, with many repeated questions.  We reviewed the handout with the steps for starting/removing the sensor, as well as saving and reusing the transmitter.  At her request,  these steps down were written down for her and she reverbalized them with much difficulty.   She was encouraged to read over the manual, again today and tomorrow, and she agreed to do this.   She was entered into the computer and the sensor was started and placed on her right abdomen.   We also reviewed when a blood sugar is needed and she reported good understanding of this as well. She had no final questions for me at this time.

## 2019-10-02 ENCOUNTER — Other Ambulatory Visit: Payer: Self-pay

## 2019-10-02 NOTE — Patient Outreach (Signed)
[facility]) Care Management  10/02/2019  [name] 13-Aug-1965 [id number]   Multiple attempts to establish contact with patient without success. No response from letter mailed to patient.   Plan: RN CM will close case at this time.   [name], RN, MSN [place] Management Care Management Coordinator Direct Line [phone] Cell [phone] Toll Free: [phone]  Fax: [phone]

## 2019-10-08 IMAGING — DX PORTABLE CHEST - 1 VIEW
1 series · 1 of 1 positions shown · non-contrast
Comparison: December 15, 2018

CLINICAL DATA: Central line placement

EXAM:
PORTABLE CHEST 1 VIEW

[chest ap]
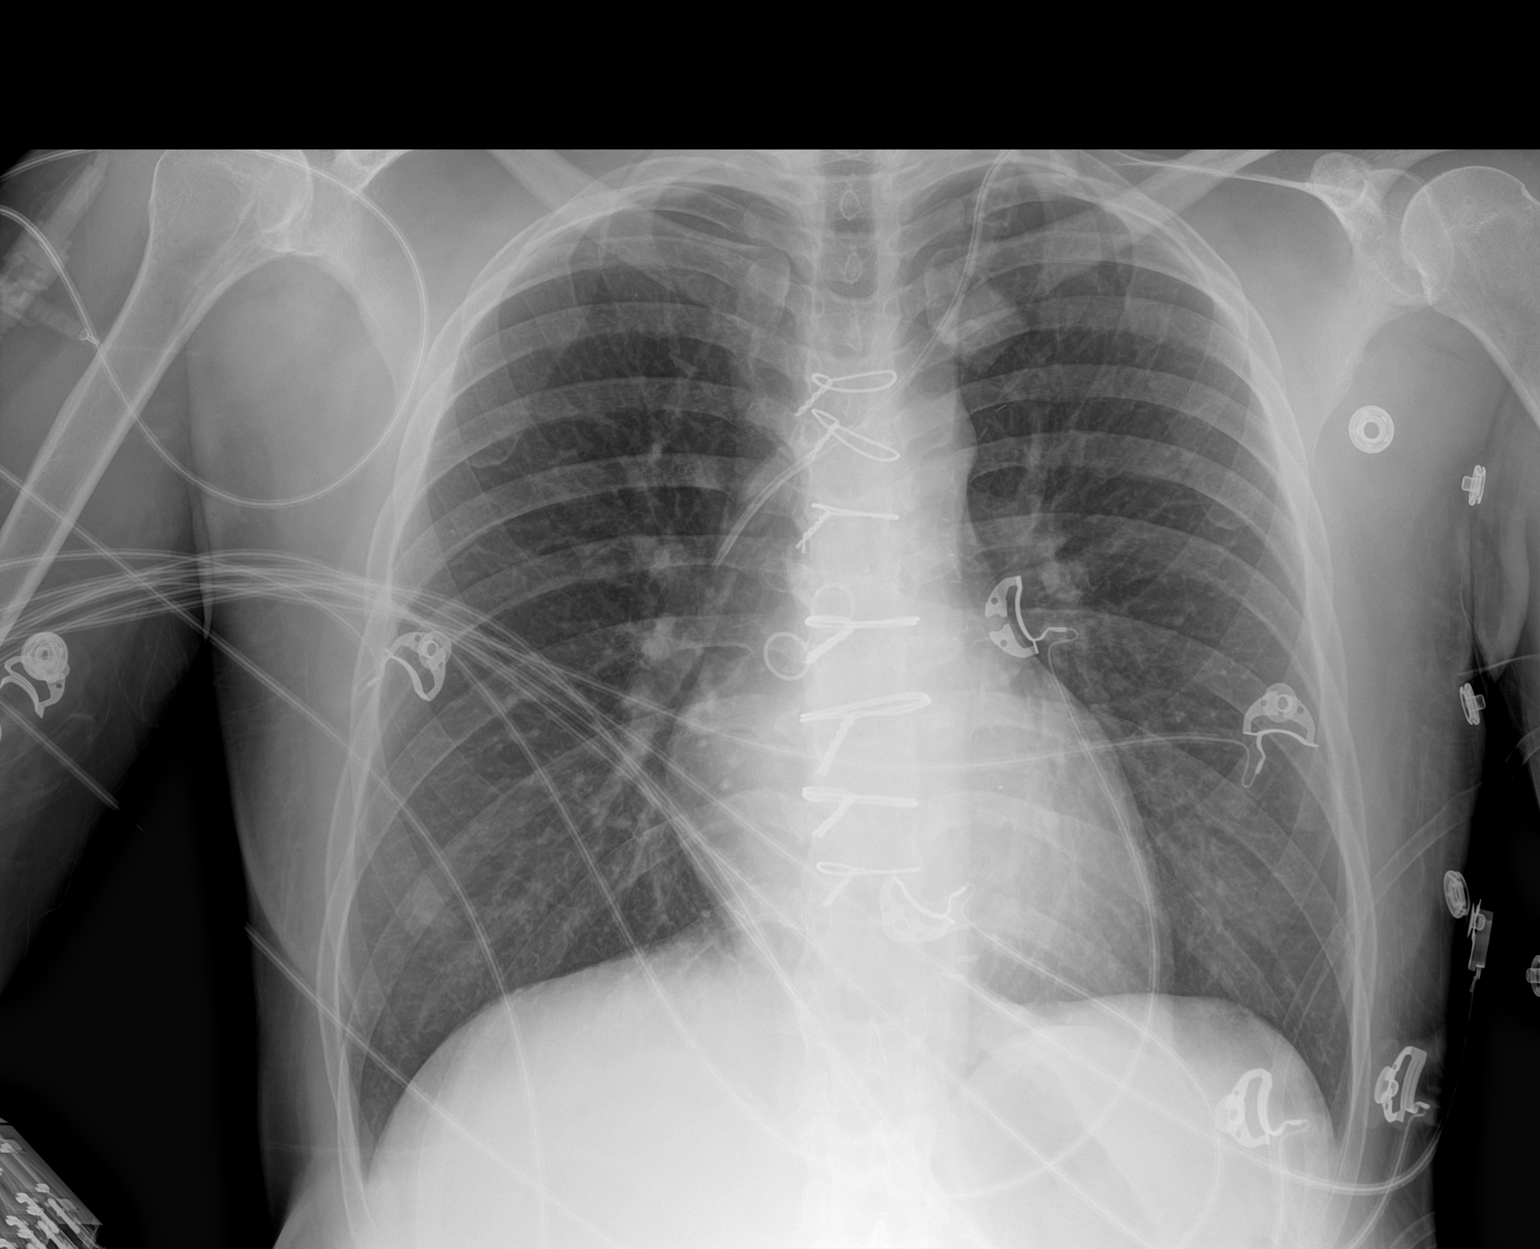

[1 of 1 positions shown; findings below may reference images not displayed]

FINDINGS: The left-sided central venous catheter tip terminates over the SVC.
Again noted are postsurgical changes related to prior median
sternotomy. There is no evidence of a left-sided pneumothorax. There
is mild volume overload. There may be a trace right-sided pleural
effusion. There is no acute osseous abnormality.
IMPRESSION: 1. Left-sided central venous catheter tip terminates over the
proximal SVC. There is no evidence for left-sided pneumothorax.
2. Mild volume overload.
3. Status post median sternotomy.

## 2019-10-13 DIAGNOSIS — M84351A Stress fracture, right femur, initial encounter for fracture: Secondary | ICD-10-CM | POA: Diagnosis not present

## 2019-10-13 DIAGNOSIS — R6889 Other general symptoms and signs: Secondary | ICD-10-CM | POA: Diagnosis not present

## 2019-10-15 DIAGNOSIS — R0602 Shortness of breath: Secondary | ICD-10-CM | POA: Diagnosis not present

## 2019-10-15 DIAGNOSIS — N183 Chronic kidney disease, stage 3 unspecified: Secondary | ICD-10-CM | POA: Diagnosis not present

## 2019-10-15 DIAGNOSIS — R109 Unspecified abdominal pain: Secondary | ICD-10-CM | POA: Diagnosis not present

## 2019-10-15 DIAGNOSIS — R0682 Tachypnea, not elsewhere classified: Secondary | ICD-10-CM | POA: Diagnosis not present

## 2019-10-15 DIAGNOSIS — R10819 Abdominal tenderness, unspecified site: Secondary | ICD-10-CM | POA: Diagnosis not present

## 2019-10-15 DIAGNOSIS — Z452 Encounter for adjustment and management of vascular access device: Secondary | ICD-10-CM | POA: Diagnosis not present

## 2019-10-15 DIAGNOSIS — R9431 Abnormal electrocardiogram [ECG] [EKG]: Secondary | ICD-10-CM | POA: Diagnosis not present

## 2019-10-15 DIAGNOSIS — I998 Other disorder of circulatory system: Secondary | ICD-10-CM | POA: Diagnosis not present

## 2019-10-15 DIAGNOSIS — E669 Obesity, unspecified: Secondary | ICD-10-CM | POA: Diagnosis not present

## 2019-10-15 DIAGNOSIS — N12 Tubulo-interstitial nephritis, not specified as acute or chronic: Secondary | ICD-10-CM | POA: Diagnosis not present

## 2019-10-15 DIAGNOSIS — E1043 Type 1 diabetes mellitus with diabetic autonomic (poly)neuropathy: Secondary | ICD-10-CM | POA: Diagnosis not present

## 2019-10-15 DIAGNOSIS — E1022 Type 1 diabetes mellitus with diabetic chronic kidney disease: Secondary | ICD-10-CM | POA: Diagnosis not present

## 2019-10-15 DIAGNOSIS — E109 Type 1 diabetes mellitus without complications: Secondary | ICD-10-CM | POA: Diagnosis not present

## 2019-10-15 DIAGNOSIS — R1111 Vomiting without nausea: Secondary | ICD-10-CM | POA: Diagnosis not present

## 2019-10-15 DIAGNOSIS — K3184 Gastroparesis: Secondary | ICD-10-CM | POA: Diagnosis not present

## 2019-10-15 DIAGNOSIS — I129 Hypertensive chronic kidney disease with stage 1 through stage 4 chronic kidney disease, or unspecified chronic kidney disease: Secondary | ICD-10-CM | POA: Diagnosis not present

## 2019-10-15 DIAGNOSIS — R918 Other nonspecific abnormal finding of lung field: Secondary | ICD-10-CM | POA: Diagnosis not present

## 2019-10-15 DIAGNOSIS — Z8639 Personal history of other endocrine, nutritional and metabolic disease: Secondary | ICD-10-CM | POA: Diagnosis not present

## 2019-10-15 DIAGNOSIS — Z9641 Presence of insulin pump (external) (internal): Secondary | ICD-10-CM | POA: Diagnosis not present

## 2019-10-15 DIAGNOSIS — I10 Essential (primary) hypertension: Secondary | ICD-10-CM | POA: Diagnosis not present

## 2019-10-15 DIAGNOSIS — R1084 Generalized abdominal pain: Secondary | ICD-10-CM | POA: Diagnosis not present

## 2019-10-15 DIAGNOSIS — R112 Nausea with vomiting, unspecified: Secondary | ICD-10-CM | POA: Diagnosis not present

## 2019-10-15 DIAGNOSIS — I2510 Atherosclerotic heart disease of native coronary artery without angina pectoris: Secondary | ICD-10-CM | POA: Diagnosis not present

## 2019-10-16 ENCOUNTER — Telehealth: Payer: Self-pay | Admitting: Family Medicine

## 2019-10-16 DIAGNOSIS — R9431 Abnormal electrocardiogram [ECG] [EKG]: Secondary | ICD-10-CM | POA: Diagnosis not present

## 2019-10-16 DIAGNOSIS — N12 Tubulo-interstitial nephritis, not specified as acute or chronic: Secondary | ICD-10-CM | POA: Diagnosis not present

## 2019-10-16 DIAGNOSIS — K3184 Gastroparesis: Secondary | ICD-10-CM | POA: Diagnosis not present

## 2019-10-16 DIAGNOSIS — E109 Type 1 diabetes mellitus without complications: Secondary | ICD-10-CM | POA: Diagnosis not present

## 2019-10-16 DIAGNOSIS — I10 Essential (primary) hypertension: Secondary | ICD-10-CM | POA: Diagnosis not present

## 2019-10-16 MED ORDER — GENERIC EXTERNAL MEDICATION
1.00 | Status: DC
Start: 2019-10-16 — End: 2019-10-16

## 2019-10-16 MED ORDER — ASPIRIN 81 MG PO TBEC
81.00 | DELAYED_RELEASE_TABLET | ORAL | Status: DC
Start: 2019-10-17 — End: 2019-10-16

## 2019-10-16 MED ORDER — HYDROCODONE-ACETAMINOPHEN 5-325 MG PO TABS
2.00 | ORAL_TABLET | ORAL | Status: DC
Start: ? — End: 2019-10-16

## 2019-10-16 MED ORDER — LOSARTAN POTASSIUM 50 MG PO TABS
100.00 | ORAL_TABLET | ORAL | Status: DC
Start: 2019-10-17 — End: 2019-10-16

## 2019-10-16 MED ORDER — SODIUM CHLORIDE 0.9 % IV SOLN
INTRAVENOUS | Status: DC
Start: ? — End: 2019-10-16

## 2019-10-16 MED ORDER — GLUCAGON (RDNA) 1 MG IJ KIT
1.00 | PACK | INTRAMUSCULAR | Status: DC
Start: ? — End: 2019-10-16

## 2019-10-16 MED ORDER — BUSPIRONE HCL 10 MG PO TABS
10.00 | ORAL_TABLET | ORAL | Status: DC
Start: 2019-10-16 — End: 2019-10-16

## 2019-10-16 MED ORDER — DEXTROSE 10 % IV SOLN
125.00 | INTRAVENOUS | Status: DC
Start: ? — End: 2019-10-16

## 2019-10-16 MED ORDER — GENERIC EXTERNAL MEDICATION
12.50 | Status: DC
Start: 2019-10-16 — End: 2019-10-16

## 2019-10-16 MED ORDER — PANTOPRAZOLE SODIUM 40 MG PO TBEC
40.00 | DELAYED_RELEASE_TABLET | ORAL | Status: DC
Start: 2019-10-16 — End: 2019-10-16

## 2019-10-16 MED ORDER — INSULIN GLARGINE 100 UNIT/ML ~~LOC~~ SOLN
10.00 | SUBCUTANEOUS | Status: DC
Start: 2019-10-16 — End: 2019-10-16

## 2019-10-16 MED ORDER — GLUCOSE 40 % PO GEL
15.00 | ORAL | Status: DC
Start: ? — End: 2019-10-16

## 2019-10-16 MED ORDER — ATORVASTATIN CALCIUM 40 MG PO TABS
40.00 | ORAL_TABLET | ORAL | Status: DC
Start: 2019-10-17 — End: 2019-10-16

## 2019-10-16 MED ORDER — PROCHLORPERAZINE EDISYLATE 10 MG/2ML IJ SOLN
10.00 | INTRAMUSCULAR | Status: DC
Start: ? — End: 2019-10-16

## 2019-10-16 MED ORDER — HEPARIN SODIUM (PORCINE) 5000 UNIT/ML IJ SOLN
5000.00 | INTRAMUSCULAR | Status: DC
Start: 2019-10-16 — End: 2019-10-16

## 2019-10-16 MED ORDER — METOCLOPRAMIDE HCL 5 MG/ML IJ SOLN
10.00 | INTRAMUSCULAR | Status: DC
Start: 2019-10-16 — End: 2019-10-16

## 2019-10-16 MED ORDER — INSULIN LISPRO 100 UNIT/ML ~~LOC~~ SOLN
2.00 | SUBCUTANEOUS | Status: DC
Start: 2019-10-16 — End: 2019-10-16

## 2019-10-16 MED ORDER — PREGABALIN 75 MG PO CAPS
150.00 | ORAL_CAPSULE | ORAL | Status: DC
Start: 2019-10-16 — End: 2019-10-16

## 2019-10-16 NOTE — Telephone Encounter (Signed)
Westley from  [facility] to let  Dr Margarita Rana that Mrs  Lindau  Text him on Wednesday  That she wasn't feel good for her therapy  And Chyrl Civatte is been trying to contact her  Since  Thursday and Today  And she is not answer

## 2019-10-16 NOTE — Telephone Encounter (Signed)
Currently hospitalized.

## 2019-10-16 NOTE — Telephone Encounter (Signed)
Will route to PCP for review.    Patient is currently in the hospital.

## 2019-10-17 DIAGNOSIS — N183 Chronic kidney disease, stage 3 unspecified: Secondary | ICD-10-CM | POA: Diagnosis not present

## 2019-10-17 DIAGNOSIS — E1043 Type 1 diabetes mellitus with diabetic autonomic (poly)neuropathy: Secondary | ICD-10-CM | POA: Diagnosis not present

## 2019-10-17 DIAGNOSIS — K3184 Gastroparesis: Secondary | ICD-10-CM | POA: Diagnosis not present

## 2019-10-17 DIAGNOSIS — Z9641 Presence of insulin pump (external) (internal): Secondary | ICD-10-CM | POA: Diagnosis not present

## 2019-10-17 DIAGNOSIS — N12 Tubulo-interstitial nephritis, not specified as acute or chronic: Secondary | ICD-10-CM | POA: Diagnosis not present

## 2019-10-17 DIAGNOSIS — E109 Type 1 diabetes mellitus without complications: Secondary | ICD-10-CM | POA: Diagnosis not present

## 2019-10-17 DIAGNOSIS — I10 Essential (primary) hypertension: Secondary | ICD-10-CM | POA: Diagnosis not present

## 2019-10-17 DIAGNOSIS — E1022 Type 1 diabetes mellitus with diabetic chronic kidney disease: Secondary | ICD-10-CM | POA: Diagnosis not present

## 2019-10-17 DIAGNOSIS — I129 Hypertensive chronic kidney disease with stage 1 through stage 4 chronic kidney disease, or unspecified chronic kidney disease: Secondary | ICD-10-CM | POA: Diagnosis not present

## 2019-10-17 DIAGNOSIS — I2510 Atherosclerotic heart disease of native coronary artery without angina pectoris: Secondary | ICD-10-CM | POA: Diagnosis not present

## 2019-10-17 DIAGNOSIS — E669 Obesity, unspecified: Secondary | ICD-10-CM | POA: Diagnosis not present

## 2019-10-20 ENCOUNTER — Other Ambulatory Visit: Payer: Self-pay

## 2019-10-21 ENCOUNTER — Ambulatory Visit: Payer: Medicare HMO | Admitting: Endocrinology

## 2019-10-21 ENCOUNTER — Encounter: Payer: Self-pay | Admitting: Endocrinology

## 2019-10-21 VITALS — BP 110/70 | HR 77 | Ht 61.0 in | Wt 169.8 lb

## 2019-10-21 DIAGNOSIS — E1065 Type 1 diabetes mellitus with hyperglycemia: Secondary | ICD-10-CM

## 2019-10-21 DIAGNOSIS — E1143 Type 2 diabetes mellitus with diabetic autonomic (poly)neuropathy: Secondary | ICD-10-CM

## 2019-10-21 DIAGNOSIS — K3184 Gastroparesis: Secondary | ICD-10-CM

## 2019-10-21 NOTE — Progress Notes (Signed)
Patient ID: [name], female   DOB: 05/08/1966, 54 y.o.   MRN: [id number]           Reason for Appointment : Follow-up for Type 1 Diabetes  History of Present Illness          Diagnosis: Type 1 diabetes mellitus, date of diagnosis: 1977         Previous history:  She has mostly been followed by primary care physicians for her diabetes with consistently poor control She has had periodic episodes of ketoacidosis Previous insulin regimen:  BASAL insulin: LANTUS 15 units in a.m. MEALTIME insulin: Novolog variable doses: Usually 6-8 at breakfast, 2-3 at lunch and dinner   Recent history:   INSULIN regimen: OmniPod insulin pump since 07/07/2019  Basal rate: Midnight-5 AM = 0.90 units, 5 AM to 9 AM = 0.65 and 9 AM to 12 AM = 0.75 I/C ratio 1:1;   ISF: 40, target:120 with correction over 120   A1c is 7.2, done in 08/2019   Current management, blood sugar patterns and problems identified:    She was again admitted to the hospital for gastroparesis about a week ago  Her insulin pump was discontinued when she was in the hospital  Her blood sugars appear to be highly variable before hospitalization and no consistent pattern seen  Average blood sugar for the last 2 weeks is 182 from her CGM with only 53% within target  CGM is programmed for the year 2022  In the last 2 days her blood sugars were almost consistently within the target range except for higher readings late evening  She has mostly been bolusing 2 units for her meals because of small portions when she is consuming, previously was recommended 4 to 5 units  Recent fasting readings range from 97 up to 158  She is somewhat afraid of hypoglycemia and last evening when blood sugar was low normal and she drank 8 ounces of Coke and had candy causing her blood sugar to be higher.    Subsequently later at night she had a lot of nodules for snacks without any boluses causing her blood sugar to be over 300 during the night and  early morning  Did appear to have somewhat higher blood sugar above 182 days ago after her evening bolus with 2 unit dosage  Glucose monitoring:  is being done 4+ times a day        CGM data analysis not done since she was not on the pump consistently in the last 2 weeks  Symptoms of hypoglycemia: Weakness, sometimes none Treatment of hypoglycemia: Snacks         Self-care: The diet that the patient has been following is: None  Mealtimes are: Breakfast egg, cheese, sausage, occasionally grits, toast;   Lunch: Sandwich or only snacks, dinner: baked chricken, rice, potatoes                Dietician consultation: Most recent: years ago.         CDE consultation: 04/2018  Wt Readings from Last 3 Encounters:  10/21/19 169 lb 12.8 oz (77 kg)  09/22/19 164 lb (74.4 kg)  09/02/19 159 lb (72.1 kg)   Diabetes labs:  Lab Results  Component Value Date   HGBA1C 7.2 (A) 07/28/2019   HGBA1C 8.8 (A) 04/15/2019   HGBA1C 9.8 (H) 12/15/2018   Lab Results  Component Value Date   MICROALBUR 14.3 (H) 04/28/2018   LDLCALC 54 01/07/2018   CREATININE 1.33 (H) 05/15/2019  Lab Results  Component Value Date   MICRALBCREAT 17.1 04/28/2018   MICRALBCREAT 254 (H) 09/04/2016     Allergies as of 10/21/2019      Reactions   Metoclopramide Other (See Comments)   Dystonia, muscle rigidity.  Patient states she is only allergic to the "pill form, not the IV" Slurred speech, tongue swelling   Anesthetics, Amide Nausea And Vomiting   Chlorhexidine    Penicillins Diarrhea, Nausea And Vomiting, Other (See Comments)   Has patient had a PCN reaction causing immediate rash, facial/tongue/throat swelling, SOB or lightheadedness with hypotension: Yes Has patient had a PCN reaction causing severe rash involving mucus membranes or skin necrosis: No Has patient had a PCN reaction that required hospitalization No Has patient had a PCN reaction occurring within the last 10 years: Yes  If all of the above  answers are "NO", then may proceed with Cephalosporin use.   Buprenorphine Hcl Rash   Encainide Nausea And Vomiting      Medication List       Accurate as of October 21, 2019  9:06 PM. If you have any questions, ask your nurse or doctor.        STOP taking these medications   FreeStyle Libre 2 Sensor Systm Misc Stopped by: Elayne Snare, MD     TAKE these medications   ammonium lactate 12 % cream Commonly known as: AMLACTIN Apply topically as needed for dry skin.   aspirin EC 81 MG tablet Take 1 tablet (81 mg total) by mouth daily.   atorvastatin 40 MG tablet Commonly known as: LIPITOR Take 1 tablet (40 mg total) by mouth daily.   busPIRone 10 MG tablet Commonly known as: BUSPAR Take 1 tablet (10 mg total) by mouth 3 (three) times daily.   Contour Test test strip Generic drug: glucose blood 1 each by Other route 4 (four) times daily. E11.9   docusate sodium 100 MG capsule Commonly known as: Colace Take 1 capsule (100 mg total) by mouth daily as needed for mild constipation.   erythromycin 250 MG tablet Commonly known as: ERYTHROCIN Take 1 tablet with water 30 minutes before each meal   furosemide 40 MG tablet Commonly known as: LASIX Take 1 tablet (40 mg total) by mouth every other day.   HYDROcodone-acetaminophen 5-325 MG tablet Commonly known as: NORCO/VICODIN Take 1 tablet by mouth as needed.   insulin aspart 100 UNIT/ML injection Commonly known as: novoLOG Inject 50 Units into the skin daily. For use in pump   Insulin Syringe-Needle U-100 31G X 5/16" 0.5 ML Misc Commonly known as: BD Insulin Syringe Ultrafine 1 each by Does not apply route 4 (four) times daily.   loperamide 2 MG capsule Commonly known as: IMODIUM Take 1 capsule (2 mg total) by mouth 3 (three) times daily as needed for diarrhea or loose stools.   losartan 25 MG tablet Commonly known as: COZAAR Take 1 tablet (25 mg total) by mouth daily.   metoprolol tartrate 25 MG tablet Commonly  known as: LOPRESSOR Take 0.5 tablets (12.5 mg total) by mouth 2 (two) times daily.   [place] Kit by Does not apply route.   ondansetron 4 MG tablet Commonly known as: Zofran Take 1 tablet (4 mg total) by mouth every 8 (eight) hours as needed for nausea or vomiting.   pantoprazole 40 MG tablet Commonly known as: PROTONIX Take 1 tablet (40 mg total) by mouth daily.   polyethylene glycol 17 g packet Commonly known as: MIRALAX /  GLYCOLAX Take 17 g by mouth 2 (two) times daily as needed for moderate constipation.   pregabalin 150 MG capsule Commonly known as: Lyrica Take 1 capsule (150 mg total) by mouth 2 (two) times daily.   pyridostigmine 60 MG tablet Commonly known as: MESTINON Take 60 mg by mouth 3 (three) times daily.       Allergies:  Allergies  Allergen Reactions  . Metoclopramide Other (See Comments)    Dystonia, muscle rigidity.  Patient states she is only allergic to the "pill form, not the IV" Slurred speech, tongue swelling  . Anesthetics, Amide Nausea And Vomiting  . Chlorhexidine   . Penicillins Diarrhea, Nausea And Vomiting and Other (See Comments)    Has patient had a PCN reaction causing immediate rash, facial/tongue/throat swelling, SOB or lightheadedness with hypotension: Yes Has patient had a PCN reaction causing severe rash involving mucus membranes or skin necrosis: No Has patient had a PCN reaction that required hospitalization No Has patient had a PCN reaction occurring within the last 10 years: Yes  If all of the above answers are "NO", then may proceed with Cephalosporin use.   . Buprenorphine Hcl Rash  . Encainide Nausea And Vomiting    Past Medical History:  Diagnosis Date  . AKI (acute kidney injury) ([place])   . Anemia, iron deficiency   . Anxiety and depression 05/18/2015  . CAD (coronary artery disease), native coronary artery 01/11/2018   s/p CABG - Non-STEMI with severe three-vessel disease noted July 2019  . Depression     . Diabetic gastroparesis ([place])   . Diabetic neuropathy, type I diabetes mellitus ([place]) 05/18/2015  . DKA, type 1 ([place]) 03/18/2016  . Essential hypertension   . Gastroparesis   . GERD (gastroesophageal reflux disease)   . HLD (hyperlipidemia)   . MDD (major depressive disorder), single episode, severe , no psychosis ([place])   . Tardive dyskinesia   . Vitamin B12 deficiency 08/16/2015    Past Surgical History:  Procedure Laterality Date  . CARDIAC SURGERY    . COLONOSCOPY  09/27/2014   at [facility]  . CORONARY ARTERY BYPASS GRAFT N/A 01/14/2018   Procedure: CORONARY ARTERY BYPASS GRAFTING (CABG)X3, RIGHT AND LEFT SAPHENOUS VEIN HARVEST, MAMMARY TAKE DOWN. MAMMARY TO LAD, SVG TO PD, SVG TO DISTAL CIRC.;  Surgeon: Grace Isaac, MD;  Location: [place];  Service: Open Heart Surgery;  Laterality: N/A;  . ESOPHAGOGASTRODUODENOSCOPY  09/27/2014   at [facility], Dr Rolan Lipa. biospy neg for celiac, neg for H pylori.   . EYE SURGERY    . gailstones    . INTRAMEDULLARY (IM) NAIL INTERTROCHANTERIC Right 05/10/2019   Procedure: INTRAMEDULLARY (IM) NAIL INTERTROCHANTRIC;  Surgeon: Lovell Sheehan, MD;  Location: [facility];  Service: Orthopedics;  Laterality: Right;  . IR FLUORO GUIDE CV LINE RIGHT  02/01/2017  . IR FLUORO GUIDE CV LINE RIGHT  03/06/2017  . IR FLUORO GUIDE CV LINE RIGHT  03/25/2017  . IR GENERIC HISTORICAL  01/24/2016   IR FLUORO GUIDE CV LINE RIGHT 01/24/2016 Darrell K Allred, PA-C [facility]  . IR GENERIC HISTORICAL  01/24/2016   IR US GUIDE VASC ACCESS RIGHT 01/24/2016 Darrell K Allred, PA-C [facility]  . IR US GUIDE VASC ACCESS RIGHT  02/01/2017  . IR US GUIDE VASC ACCESS RIGHT  03/06/2017  . IR US GUIDE VASC ACCESS RIGHT  03/25/2017  . IR US GUIDE VASC ACCESS RIGHT  01/20/2019  . IR VENIPUNCTURE 27YRS/OLDER BY MD  01/20/2019  . LEFT  HEART CATH AND CORONARY ANGIOGRAPHY N/A 01/07/2018   Procedure: LEFT HEART CATH AND CORONARY ANGIOGRAPHY;  Surgeon: Troy Sine, MD;  Location: [facility]  CV [facility];  Service: Cardiovascular;  Laterality: N/A;  . POSTERIOR VITRECTOMY AND MEMBRANE PEEL-LEFT EYE  09/28/2002  . POSTERIOR VITRECTOMY AND MEMBRANE PEEL-RIGHT EYE  03/16/2002  . RETINAL DETACHMENT SURGERY    . TEE WITHOUT CARDIOVERSION N/A 01/14/2018   Procedure: TRANSESOPHAGEAL ECHOCARDIOGRAM (TEE);  Surgeon: Grace Isaac, MD;  Location: [facility];  Service: Open Heart Surgery;  Laterality: N/A;    Family History  Problem Relation Age of Onset  . Cystic fibrosis Mother   . Hypertension Father   . Diabetes Brother   . Hypertension Maternal Grandmother     Social History:  reports that she has never smoked. She has never used smokeless tobacco. She reports current drug use. Drug: Marijuana. She reports that she does not drink alcohol.      Review of Systems      Lipids: She had been taking atorvastatin for history of CAD, now, prescribed by cardiologist but no recent labs available  Lab Results  Component Value Date   CHOL 121 01/07/2018   HDL 47 01/07/2018   LDLCALC 54 01/07/2018   TRIG 101 01/07/2018   CHOLHDL 2.6 01/07/2018     DIABETES COMPLICATIONS: Gastroparesis treated with Phenergan as needed She was told to try Mestinon by a gastroenterologist in [place]; she does not think this was effective Had intolerance to Reglan because of tremor  She has had 2 episodes in the last 2 months of needing admission to the hospital She was advised to start erythromycin for gastroparesis but she could not afford the co-pay  Is due to see gastroenterologist next week   On Lyrica 150 mg for peripheral neuropathy, taking this long-term    Physical Examination:  BP 110/70 (BP Location: Left Arm, Patient Position: Sitting, Cuff Size: Normal)   Pulse 77   Ht 5' 1"  (1.549 m)   Wt 169 lb 12.8 oz (77 kg)   SpO2 94%   BMI 32.08 kg/m       ASSESSMENT:  Diabetes type 1, long-standing with multiple complications  See history of present illness for detailed discussion  of current diabetes management, blood sugar patterns and problems identified  Her A1c is last 7.2  Although she is using the Dexcom on we were able to review her CGM today her blood sugars are highly inconsistent When she was off the pump during hospitalization blood sugars were markedly higher In the last 2 days her blood sugars are excellent throughout the day except for when she is not bolusing enough for evening meal or snacks Is tending to have low normal blood sugars in the morning hours and occasionally later in the day She does appear to be overtreating hypoglycemia and does not suspend her pump when blood sugar is low Also does not understand the need to take any boluses for snacks especially if they are high fat  Will need to have an A1c repeated on the next visit  Gastroparesis: She will discuss management with gastroenterologist, not able to afford the co-pay for erythromycin  Is overdue for lipid panel  PLAN:   We will reduce her basal rate at 5 AM down to 0.60 to prevent any low sugars in the morning Also may consider reducing afternoon basal rates However since her diet may be changing and she may need higher doses of insulin will wait till the next  visit Discussed appropriate treatment of hypoglycemia including suspending the pump for 30 minutes and not exceeding 4 to 6 ounces of regular soft drink She was shown how to use temporary suspension of her pump She is still not able to change her basal rate and this was done under supervision today Reminded her to take enough boluses for snacks and may take up to 4 units for any high fat meals or snacks   There are no Patient Instructions on file for this visit.    Elayne Snare 10/21/2019, 9:06 PM     Note: This note was prepared with Dragon voice recognition system technology. Any transcriptional errors that result from this process are unintentional.

## 2019-10-23 ENCOUNTER — Other Ambulatory Visit: Payer: Medicare HMO

## 2019-10-26 ENCOUNTER — Ambulatory Visit: Payer: Medicare HMO | Admitting: Internal Medicine

## 2019-11-03 ENCOUNTER — Ambulatory Visit: Payer: Medicare HMO | Attending: Family Medicine | Admitting: Family Medicine

## 2019-11-03 ENCOUNTER — Encounter: Payer: Self-pay | Admitting: Family Medicine

## 2019-11-03 ENCOUNTER — Other Ambulatory Visit: Payer: Self-pay

## 2019-11-03 VITALS — BP 126/73 | HR 68 | Ht 61.0 in | Wt 163.0 lb

## 2019-11-03 DIAGNOSIS — Z1231 Encounter for screening mammogram for malignant neoplasm of breast: Secondary | ICD-10-CM

## 2019-11-03 DIAGNOSIS — K3184 Gastroparesis: Secondary | ICD-10-CM | POA: Diagnosis not present

## 2019-11-03 DIAGNOSIS — E1043 Type 1 diabetes mellitus with diabetic autonomic (poly)neuropathy: Secondary | ICD-10-CM

## 2019-11-03 DIAGNOSIS — N390 Urinary tract infection, site not specified: Secondary | ICD-10-CM | POA: Diagnosis not present

## 2019-11-03 DIAGNOSIS — F32A Depression, unspecified: Secondary | ICD-10-CM

## 2019-11-03 DIAGNOSIS — I2510 Atherosclerotic heart disease of native coronary artery without angina pectoris: Secondary | ICD-10-CM | POA: Diagnosis not present

## 2019-11-03 DIAGNOSIS — F329 Major depressive disorder, single episode, unspecified: Secondary | ICD-10-CM | POA: Diagnosis not present

## 2019-11-03 DIAGNOSIS — F419 Anxiety disorder, unspecified: Secondary | ICD-10-CM | POA: Diagnosis not present

## 2019-11-03 DIAGNOSIS — E1040 Type 1 diabetes mellitus with diabetic neuropathy, unspecified: Secondary | ICD-10-CM | POA: Diagnosis not present

## 2019-11-03 LAB — GLUCOSE, POCT (MANUAL RESULT ENTRY): POC Glucose: 126 mg/dl — AB (ref 70–99)

## 2019-11-03 MED ORDER — ONDANSETRON HCL 4 MG PO TABS: 4.0000 mg | ORAL_TABLET | Freq: Three times a day (TID) | ORAL | 0 refills | Status: DC | PRN

## 2019-11-03 MED ORDER — BUSPIRONE HCL 10 MG PO TABS: 10.0000 mg | ORAL_TABLET | Freq: Three times a day (TID) | ORAL | 3 refills | Status: DC

## 2019-11-03 NOTE — Patient Instructions (Signed)
Urinary Tract Infection, Adult A urinary tract infection (UTI) is an infection of any part of the urinary tract. The urinary tract includes:  The kidneys.  The ureters.  The bladder.  The urethra. These organs make, store, and get rid of pee (urine) in the body. What are the causes? This is caused by germs (bacteria) in your genital area. These germs grow and cause swelling (inflammation) of your urinary tract. What increases the risk? You are more likely to develop this condition if:  You have a small, thin tube (catheter) to drain pee.  You cannot control when you pee or poop (incontinence).  You are female, and: ? You use these methods to prevent pregnancy:  A medicine that kills sperm (spermicide).  A device that blocks sperm (diaphragm). ? You have low levels of a female hormone (estrogen). ? You are pregnant.  You have genes that add to your risk.  You are sexually active.  You take antibiotic medicines.  You have trouble peeing because of: ? A prostate that is bigger than normal, if you are female. ? A blockage in the part of your body that drains pee from the bladder (urethra). ? A kidney stone. ? A nerve condition that affects your bladder (neurogenic bladder). ? Not getting enough to drink. ? Not peeing often enough.  You have other conditions, such as: ? Diabetes. ? A weak disease-fighting system (immune system). ? Sickle cell disease. ? Gout. ? Injury of the spine. What are the signs or symptoms? Symptoms of this condition include:  Needing to pee right away (urgently).  Peeing often.  Peeing small amounts often.  Pain or burning when peeing.  Blood in the pee.  Pee that smells bad or not like normal.  Trouble peeing.  Pee that is cloudy.  Fluid coming from the vagina, if you are female.  Pain in the belly or lower back. Other symptoms include:  Throwing up (vomiting).  No urge to eat.  Feeling mixed up (confused).  Being tired  and grouchy (irritable).  A fever.  Watery poop (diarrhea). How is this treated? This condition may be treated with:  Antibiotic medicine.  Other medicines.  Drinking enough water. Follow these instructions at home:  Medicines  Take over-the-counter and prescription medicines only as told by your doctor.  If you were prescribed an antibiotic medicine, take it as told by your doctor. Do not stop taking it even if you start to feel better. General instructions  Make sure you: ? Pee until your bladder is empty. ? Do not hold pee for a long time. ? Empty your bladder after sex. ? Wipe from front to back after pooping if you are a female. Use each tissue one time when you wipe.  Drink enough fluid to keep your pee pale yellow.  Keep all follow-up visits as told by your doctor. This is important. Contact a doctor if:  You do not get better after 1-2 days.  Your symptoms go away and then come back. Get help right away if:  You have very bad back pain.  You have very bad pain in your lower belly.  You have a fever.  You are sick to your stomach (nauseous).  You are throwing up. Summary  A urinary tract infection (UTI) is an infection of any part of the urinary tract.  This condition is caused by germs in your genital area.  There are many risk factors for a UTI. These include having a small, thin   tube to drain pee and not being able to control when you pee or poop.  Treatment includes antibiotic medicines for germs.  Drink enough fluid to keep your pee pale yellow. This information is not intended to replace advice given to you by your health care provider. Make sure you discuss any questions you have with your health care provider. Document Revised: 05/29/2018 Document Reviewed: 12/19/2017 Elsevier Patient Education  2020 [employer]  

## 2019-11-03 NOTE — Progress Notes (Signed)
Subjective:  Patient ID: [name], female    DOB: [birth year]  Age: 54 y.o. MRN: [id number]  CC: Diabetes   HPI [name] is a 54 year old female with a history of uncontrolled type 1 diabetes mellitus (A1c 7.2), diabetic neuropathy, diabetic gastroparesis, hypertension, anemia, anxiety and depression, multiple ED visits and hospitalizations for diabetic gastroparesis, CAD s/p CABG x3 (LIMA to LAD, SVG to PDA, SVG to OM) in 12/2017, R femoral fracture (s/p intramedullary nail in 04/2019) here for a follow up visit.  She feels like she is coming down with a urinary tract infection but denies dycuria or hematuria. She is concerned she has had over 3 episodes of UTI in the last 5 months (In January, February and March of this year)  She is currently using her CGM and blood sugars have been good; today it is 146 in the Clinic. She also has an insulin pump in place. Her neuropathy is controlled and gastroparesis has been stable. She requests a refill of Zofran. Depression is controlled.  Past Medical History:  Diagnosis Date  . AKI (acute kidney injury) ([place])   . Anemia, iron deficiency   . Anxiety and depression 05/18/2015  . CAD (coronary artery disease), native coronary artery 01/11/2018   s/p CABG - Non-STEMI with severe three-vessel disease noted July 2019  . Depression   . Diabetic gastroparesis ([place])   . Diabetic neuropathy, type I diabetes mellitus ([place]) 05/18/2015  . DKA, type 1 ([place]) 03/18/2016  . Essential hypertension   . Gastroparesis   . GERD (gastroesophageal reflux disease)   . HLD (hyperlipidemia)   . MDD (major depressive disorder), single episode, severe , no psychosis ([place])   . Tardive dyskinesia   . Vitamin B12 deficiency 08/16/2015    Past Surgical History:  Procedure Laterality Date  . CARDIAC SURGERY    . COLONOSCOPY  09/27/2014   at [facility]  . CORONARY ARTERY BYPASS GRAFT N/A 01/14/2018   Procedure: CORONARY ARTERY BYPASS GRAFTING (CABG)X3, RIGHT AND  LEFT SAPHENOUS VEIN HARVEST, MAMMARY TAKE DOWN. MAMMARY TO LAD, SVG TO PD, SVG TO DISTAL CIRC.;  Surgeon: Grace Isaac, MD;  Location: [place];  Service: Open Heart Surgery;  Laterality: N/A;  . ESOPHAGOGASTRODUODENOSCOPY  09/27/2014   at [facility], Dr Rolan Lipa. biospy neg for celiac, neg for H pylori.   . EYE SURGERY    . gailstones    . INTRAMEDULLARY (IM) NAIL INTERTROCHANTERIC Right 05/10/2019   Procedure: INTRAMEDULLARY (IM) NAIL INTERTROCHANTRIC;  Surgeon: Lovell Sheehan, MD;  Location: [facility];  Service: Orthopedics;  Laterality: Right;  . IR FLUORO GUIDE CV LINE RIGHT  02/01/2017  . IR FLUORO GUIDE CV LINE RIGHT  03/06/2017  . IR FLUORO GUIDE CV LINE RIGHT  03/25/2017  . IR GENERIC HISTORICAL  01/24/2016   IR FLUORO GUIDE CV LINE RIGHT 01/24/2016 Darrell K Allred, PA-C [facility]  . IR GENERIC HISTORICAL  01/24/2016   IR US GUIDE VASC ACCESS RIGHT 01/24/2016 Darrell K Allred, PA-C [facility]  . IR US GUIDE VASC ACCESS RIGHT  02/01/2017  . IR US GUIDE VASC ACCESS RIGHT  03/06/2017  . IR US GUIDE VASC ACCESS RIGHT  03/25/2017  . IR US GUIDE VASC ACCESS RIGHT  01/20/2019  . IR VENIPUNCTURE 110YRS/OLDER BY MD  01/20/2019  . LEFT HEART CATH AND CORONARY ANGIOGRAPHY N/A 01/07/2018   Procedure: LEFT HEART CATH AND CORONARY ANGIOGRAPHY;  Surgeon: Troy Sine, MD;  Location: [place] [facility];  Service: Cardiovascular;  Laterality:  N/A;  . POSTERIOR VITRECTOMY AND MEMBRANE PEEL-LEFT EYE  09/28/2002  . POSTERIOR VITRECTOMY AND MEMBRANE PEEL-RIGHT EYE  03/16/2002  . RETINAL DETACHMENT SURGERY    . TEE WITHOUT CARDIOVERSION N/A 01/14/2018   Procedure: TRANSESOPHAGEAL ECHOCARDIOGRAM (TEE);  Surgeon: Grace Isaac, MD;  Location: [place];  Service: Open Heart Surgery;  Laterality: N/A;    Family History  Problem Relation Age of Onset  . Cystic fibrosis Mother   . Hypertension Father   . Diabetes Brother   . Hypertension Maternal Grandmother     Allergies  Allergen Reactions  .  Metoclopramide Other (See Comments)    Dystonia, muscle rigidity.  Patient states she is only allergic to the "pill form, not the IV" Slurred speech, tongue swelling  . Anesthetics, Amide Nausea And Vomiting  . Chlorhexidine   . Penicillins Diarrhea, Nausea And Vomiting and Other (See Comments)    Has patient had a PCN reaction causing immediate rash, facial/tongue/throat swelling, SOB or lightheadedness with hypotension: Yes Has patient had a PCN reaction causing severe rash involving mucus membranes or skin necrosis: No Has patient had a PCN reaction that required hospitalization No Has patient had a PCN reaction occurring within the last 10 years: Yes  If all of the above answers are "NO", then may proceed with Cephalosporin use.   . Buprenorphine Hcl Rash  . Encainide Nausea And Vomiting    Outpatient Medications Prior to Visit  Medication Sig Dispense Refill  . ammonium lactate (AMLACTIN) 12 % cream Apply topically as needed for dry skin. 385 g 0  . aspirin EC 81 MG tablet Take 1 tablet (81 mg total) by mouth daily.    Marland Kitchen atorvastatin (LIPITOR) 40 MG tablet Take 1 tablet (40 mg total) by mouth daily. 90 tablet 3  . busPIRone (BUSPAR) 10 MG tablet Take 1 tablet (10 mg total) by mouth 3 (three) times daily. 90 tablet 3  . docusate sodium (COLACE) 100 MG capsule Take 1 capsule (100 mg total) by mouth daily as needed for mild constipation. 10 capsule 0  . erythromycin (ERYTHROCIN) 250 MG tablet Take 1 tablet with water 30 minutes before each meal 45 tablet 1  . furosemide (LASIX) 40 MG tablet Take 1 tablet (40 mg total) by mouth every other day. 45 tablet 3  . glucose blood (CONTOUR TEST) test strip 1 each by Other route 4 (four) times daily. E11.9 120 each 2  . HYDROcodone-acetaminophen (NORCO/VICODIN) 5-325 MG tablet Take 1 tablet by mouth as needed.     . insulin aspart (NOVOLOG) 100 UNIT/ML injection Inject 50 Units into the skin daily. For use in pump 15 mL 2  . Insulin Disposable  Pump (OMNIPOD DASH SYSTEM) KIT by Does not apply route.    . Insulin Syringe-Needle U-100 (BD INSULIN SYRINGE ULTRAFINE) 31G X 5/16" 0.5 ML MISC 1 each by Does not apply route 4 (four) times daily. 120 each 5  . loperamide (IMODIUM) 2 MG capsule Take 1 capsule (2 mg total) by mouth 3 (three) times daily as needed for diarrhea or loose stools. 30 capsule 0  . losartan (COZAAR) 25 MG tablet Take 1 tablet (25 mg total) by mouth daily. 90 tablet 3  . metoprolol tartrate (LOPRESSOR) 25 MG tablet Take 0.5 tablets (12.5 mg total) by mouth 2 (two) times daily. 90 tablet 3  . ondansetron (ZOFRAN) 4 MG tablet Take 1 tablet (4 mg total) by mouth every 8 (eight) hours as needed for nausea or vomiting. 40 tablet 0  .  pantoprazole (PROTONIX) 40 MG tablet Take 1 tablet (40 mg total) by mouth daily. 30 tablet 3  . polyethylene glycol (MIRALAX / GLYCOLAX) 17 g packet Take 17 g by mouth 2 (two) times daily as needed for moderate constipation. 14 each 0  . pregabalin (LYRICA) 150 MG capsule Take 1 capsule (150 mg total) by mouth 2 (two) times daily. 180 capsule 1  . pyridostigmine (MESTINON) 60 MG tablet Take 60 mg by mouth 3 (three) times daily.     No facility-administered medications prior to visit.     ROS Review of Systems  Constitutional: Negative for activity change, appetite change and fatigue.  HENT: Negative for congestion, sinus pressure and sore throat.   Eyes: Negative for visual disturbance.  Respiratory: Negative for cough, chest tightness, shortness of breath and wheezing.   Cardiovascular: Negative for chest pain and palpitations.  Gastrointestinal: Negative for abdominal distention, abdominal pain and constipation.  Endocrine: Negative for polydipsia.  Genitourinary: Negative for dysuria and frequency.  Musculoskeletal: Negative for arthralgias and back pain.  Skin: Negative for rash.  Neurological: Negative for tremors, light-headedness and numbness.  Hematological: Does not bruise/bleed  easily.  Psychiatric/Behavioral: Negative for agitation and behavioral problems.    Objective:  BP 126/73   Pulse 68   Ht 5' 1"  (1.549 m)   Wt 163 lb (73.9 kg)   SpO2 100%   BMI 30.80 kg/m   BP/Weight 11/03/2019 10/21/2019 12/12/3557  Systolic BP 741 638 453  Diastolic BP 73 70 80  Wt. (Lbs) 163 169.8 164  BMI 30.8 32.08 30.99      Physical Exam Constitutional:      Appearance: She is well-developed.  Neck:     Vascular: No JVD.  Cardiovascular:     Rate and Rhythm: Normal rate.     Heart sounds: Normal heart sounds. No murmur.  Pulmonary:     Effort: Pulmonary effort is normal.     Breath sounds: Normal breath sounds. No wheezing or rales.     Comments: Healed CABG scar Chest:     Chest wall: No tenderness.  Abdominal:     General: Bowel sounds are normal. There is no distension.     Palpations: Abdomen is soft. There is no mass.     Tenderness: There is no abdominal tenderness.  Musculoskeletal:        General: Normal range of motion.     Right lower leg: No edema.     Left lower leg: No edema.  Skin:    Comments: CGM on lower abdomen Insulin pump on left arm  Neurological:     Mental Status: She is alert and oriented to person, place, and time.  Psychiatric:        Mood and Affect: Mood normal.     CMP Latest Ref Rng & Units 05/15/2019 05/15/2019 05/14/2019  Glucose 70 - 99 mg/dL - 236(H) 238(H)  BUN 6 - 20 mg/dL - 32(H) 31(H)  Creatinine 0.44 - 1.00 mg/dL - 1.33(H) 1.45(H)  Sodium 135 - 145 mmol/L - 136 133(L)  Potassium 3.5 - 5.1 mmol/L 4.5 5.8(H) 4.6  Chloride 98 - 111 mmol/L - 104 102  CO2 22 - 32 mmol/L - 22 22  Calcium 8.9 - 10.3 mg/dL - 8.8(L) 8.4(L)  Total Protein 6.5 - 8.1 g/dL - - -  Total Bilirubin 0.3 - 1.2 mg/dL - - -  Alkaline Phos 38 - 126 U/L - - -  AST 15 - 41 U/L - - -  ALT  0 - 44 U/L - - -    Lipid Panel     Component Value Date/Time   CHOL 121 01/07/2018 0510   TRIG 101 01/07/2018 0510   HDL 47 01/07/2018 0510   CHOLHDL  2.6 01/07/2018 0510   VLDL 20 01/07/2018 0510   LDLCALC 54 01/07/2018 0510    CBC    Component Value Date/Time   WBC 7.6 05/15/2019 0344   RBC 3.44 (L) 05/15/2019 0344   HGB 8.5 (L) 05/15/2019 0344   HGB 11.4 11/05/2018 1419   HCT 27.2 (L) 05/15/2019 0344   HCT 33.5 (L) 11/05/2018 1419   PLT 356 05/15/2019 0344   PLT 292 11/05/2018 1419   MCV 79.1 (L) 05/15/2019 0344   MCV 89 11/05/2018 1419   MCH 24.7 (L) 05/15/2019 0344   MCHC 31.3 05/15/2019 0344   RDW 21.1 (H) 05/15/2019 0344   RDW 12.7 11/05/2018 1419   LYMPHSABS 1.0 05/09/2019 2317   MONOABS 1.1 (H) 05/09/2019 2317   EOSABS 0.1 05/09/2019 2317   BASOSABS 0.1 05/09/2019 2317    Lab Results  Component Value Date   HGBA1C 7.2 (A) 07/28/2019    Assessment & Plan:   1. Well controlled type 1 diabetes mellitus with gastroparesis ([place]) Controlled with A1c of 7.2 Continue current management as per Endocrine Gastroparesis has been stable Counseled on Diabetic diet, my plate method, 956 minutes of moderate intensity exercise/week Blood sugar logs with fasting goals of 80-120 mg/dl, random of less than 180 and in the event of sugars less than 60 mg/dl or greater than 400 mg/dl encouraged to notify the clinic. Advised on the need for annual eye exams, annual foot exams, Pneumonia vaccine. - ondansetron (ZOFRAN) 4 MG tablet; Take 1 tablet (4 mg total) by mouth every 8 (eight) hours as needed for nausea or vomiting.  Dispense: 40 tablet; Refill: 0  2. Anxiety and depression Controlled - busPIRone (BUSPAR) 10 MG tablet; Take 1 tablet (10 mg total) by mouth 3 (three) times daily.  Dispense: 90 tablet; Refill: 3  3. Encounter for screening mammogram for malignant neoplasm of breast - MM 3D SCREEN BREAST BILATERAL; Future  4. Diabetic neuropathy, type I diabetes mellitus ([place]) Doing well on Gbaapentin - POCT glucose (manual entry) - Hemoglobin A1c  5. Recurrent UTI UA reveals trace leukocytes Will send of urine  culture Referred to Urology due to recurrent UTI - Urine Culture - Ambulatory referral to Urology  6. Coronary artery disease involving native coronary artery of native heart without angina pectoris Stable S/p CABG Risk factor modification   Return in about 6 months (around 05/05/2020) for chronic disease management.       [name], MD, FAAFP. [facility] and [place], [place]   11/03/2019, 12:02 PM

## 2019-11-04 ENCOUNTER — Encounter: Payer: Self-pay | Admitting: Physical Therapy

## 2019-11-04 ENCOUNTER — Telehealth: Payer: Self-pay | Admitting: Family Medicine

## 2019-11-04 ENCOUNTER — Ambulatory Visit: Payer: Medicare HMO | Attending: Orthopedic Surgery | Admitting: Physical Therapy

## 2019-11-04 DIAGNOSIS — M6281 Muscle weakness (generalized): Secondary | ICD-10-CM

## 2019-11-04 DIAGNOSIS — M25551 Pain in right hip: Secondary | ICD-10-CM | POA: Diagnosis not present

## 2019-11-04 DIAGNOSIS — R600 Localized edema: Secondary | ICD-10-CM | POA: Diagnosis not present

## 2019-11-04 DIAGNOSIS — M79605 Pain in left leg: Secondary | ICD-10-CM | POA: Insufficient documentation

## 2019-11-04 LAB — POCT URINALYSIS DIP (CLINITEK)
Bilirubin, UA: NEGATIVE
Glucose, UA: NEGATIVE mg/dL
Ketones, POC UA: NEGATIVE mg/dL
Nitrite, UA: NEGATIVE
POC PROTEIN,UA: 30 — AB
Spec Grav, UA: 1.02 (ref 1.010–1.025)
Urobilinogen, UA: 0.2 E.U./dL
pH, UA: 6.5 (ref 5.0–8.0)

## 2019-11-04 NOTE — Addendum Note (Signed)
Addended by: Gomez Cleverly on: 11/04/2019 09:07 AM   Modules accepted: Orders

## 2019-11-04 NOTE — Therapy (Signed)
[place] [address] [place], [place], [address] Phone: [phone]   Fax:  [phone]  Physical Therapy Evaluation  Patient Details  Name: [name] MRN: [id number] Date of Birth: April 10, 1966 Referring Provider (PT): [name]   Encounter Date: 11/04/2019  PT End of Session - 11/04/19 1310    Visit Number  1    Number of Visits  12    Date for PT Re-Evaluation  01/04/20    PT Start Time  0930    PT Stop Time  1015    PT Time Calculation (min)  45 min    Equipment Utilized During Treatment  Gait belt    Activity Tolerance  Patient tolerated treatment well    Behavior During Therapy  [facility] for tasks assessed/performed       Past Medical History:  Diagnosis Date  . AKI (acute kidney injury) ([place])   . Anemia, iron deficiency   . Anxiety and depression 05/18/2015  . CAD (coronary artery disease), native coronary artery 01/11/2018   s/p CABG - Non-STEMI with severe three-vessel disease noted July 2019  . Depression   . Diabetic gastroparesis ([place])   . Diabetic neuropathy, type I diabetes mellitus ([place]) 05/18/2015  . DKA, type 1 ([place]) 03/18/2016  . Essential hypertension   . Gastroparesis   . GERD (gastroesophageal reflux disease)   . HLD (hyperlipidemia)   . MDD (major depressive disorder), single episode, severe , no psychosis ([place])   . Tardive dyskinesia   . Vitamin B12 deficiency 08/16/2015    Past Surgical History:  Procedure Laterality Date  . CARDIAC SURGERY    . COLONOSCOPY  09/27/2014   at [facility]  . CORONARY ARTERY BYPASS GRAFT N/A 01/14/2018   Procedure: CORONARY ARTERY BYPASS GRAFTING (CABG)X3, RIGHT AND LEFT SAPHENOUS VEIN HARVEST, MAMMARY TAKE DOWN. MAMMARY TO LAD, SVG TO PD, SVG TO DISTAL CIRC.;  Surgeon: Yvette Isaac, [name];  Location: [place];  Service: Open Heart Surgery;  Laterality: N/A;  . ESOPHAGOGASTRODUODENOSCOPY  09/27/2014   at [facility], [name]. biospy neg for celiac,  neg for H pylori.   . EYE SURGERY    . gailstones    . INTRAMEDULLARY (IM) NAIL INTERTROCHANTERIC Right 05/10/2019   Procedure: INTRAMEDULLARY (IM) NAIL INTERTROCHANTRIC;  Surgeon: Yvette Sheehan, [name];  Location: [facility];  Service: Orthopedics;  Laterality: Right;  . IR FLUORO GUIDE CV LINE RIGHT  02/01/2017  . IR FLUORO GUIDE CV LINE RIGHT  03/06/2017  . IR FLUORO GUIDE CV LINE RIGHT  03/25/2017  . IR GENERIC HISTORICAL  01/24/2016   IR FLUORO GUIDE CV LINE RIGHT 01/24/2016 Yvette K Allred, [name] [facility]  . IR GENERIC HISTORICAL  01/24/2016   IR US GUIDE VASC ACCESS RIGHT 01/24/2016 Yvette K Allred, [name] [facility]  . IR US GUIDE VASC ACCESS RIGHT  02/01/2017  . IR US GUIDE VASC ACCESS RIGHT  03/06/2017  . IR US GUIDE VASC ACCESS RIGHT  03/25/2017  . IR US GUIDE VASC ACCESS RIGHT  01/20/2019  . IR VENIPUNCTURE 9YRS/OLDER BY [name]  01/20/2019  . LEFT HEART CATH AND CORONARY ANGIOGRAPHY N/A 01/07/2018   Procedure: LEFT HEART CATH AND CORONARY ANGIOGRAPHY;  Surgeon: Yvette Sine, [name];  Location: [place] [facility];  Service: Cardiovascular;  Laterality: N/A;  . POSTERIOR VITRECTOMY AND MEMBRANE PEEL-LEFT EYE  09/28/2002  . POSTERIOR VITRECTOMY AND MEMBRANE PEEL-RIGHT EYE  03/16/2002  . RETINAL DETACHMENT SURGERY    . TEE  WITHOUT CARDIOVERSION N/A 01/14/2018   Procedure: TRANSESOPHAGEAL ECHOCARDIOGRAM (TEE);  Surgeon: Yvette Isaac, [name];  Location: [place];  Service: Open Heart Surgery;  Laterality: N/A;    There were no vitals filed for this visit.   Subjective Assessment - 11/04/19 0935    Subjective  Pt had left femur fx after a fall; surgery on 05/10/19. Pt was discharged from the hospital to a SNF then to home. Pt reports that going up and down the stairs at home is very difficult and slow. Pt reports difficulty walking without SPC and reports she never had to use an AD before.    Pertinent History  uncontrolled diabetes, vision impairments, HTN    Limitations   Sitting;Lifting;Standing;Walking;House hold activities    Patient Stated Goals  walk without cane, have easier time with stairs    Currently in Pain?  Yes    Pain Score  5     Pain Location  Hip    Pain Orientation  Right    Pain Descriptors / Indicators  Throbbing;Aching    Pain Radiating Towards  R LE (surgical site)    Pain Onset  More than a month ago    Pain Frequency  Intermittent    Aggravating Factors   stairs, weight bearing, lifting, bending    Pain Relieving Factors  nsaids, rest         [facility] PT Assessment - 11/04/19 0001      Assessment   Medical Diagnosis  R femur fx    Referring Provider (PT)  [name]    Onset Date/Surgical Date  10/08/19    Prior Therapy  none      Precautions   Precautions  None      Restrictions   Other Position/Activity Restrictions  WBAT on RLE      Balance Screen   Has the patient fallen in the past 6 months  Yes    How many times?  1    Has the patient had a decrease in activity level because of a fear of falling?   Yes    Is the patient reluctant to leave their home because of a fear of falling?   Yes      [place] residence    Living Arrangements  Alone    Available Help at Discharge  Family    Type of [place] to enter    Entrance Stairs-Number of Steps  1    Additional Comments  5 stairs with one HR to get into bedroom      Prior Function   Level of Independence  Independent    Vocation  On disability    Leisure  shopping, walking, going to [employer]   Light Touch  Appears Intact      Functional Tests   Functional tests  Sit to Stand      Sit to Stand   Comments  WFL; some leaning to LLE      ROM / Strength   AROM / PROM / Strength  AROM;Strength      AROM   Overall AROM Comments  limited R hip flexion/extension d/t pain      Strength   Overall Strength Comments  RLE 3+/5 globally except 5/5 L DF      Flexibility   Soft  Tissue Assessment /Muscle Length  yes    Hamstrings  limited  Quadriceps  limited      Transfers   Five time sit to stand comments   12.81 sec w/ no UE support      Ambulation/Gait   Ambulation/Gait  Yes    Ambulation/Gait Assistance  6: Modified independent (Device/Increase time)    Ambulation Distance (Feet)  50 Feet    Assistive device  Straight cane;None    Gait Pattern  Step-through pattern;Decreased arm swing - right;Decreased arm swing - left;Decreased step length - right;Decreased stance time - left;Decreased stride length;Decreased hip/knee flexion - right;Decreased weight shift to right;Wide base of support    Ambulation Surface  Level    Gait velocity  diminished when walking w/out AD    Gait Comments  walks with widened BOS when ambulating w/out AD; supervision-CGA for gait w/out AD      Standardized Balance Assessment   Standardized Balance Assessment  Timed Up and Go Test      Timed Up and Go Test   TUG  Normal TUG    Normal TUG (seconds)  15.76    TUG Comments  15.76 sec with SPC; 19.2 sec without AD                  Objective measurements completed on examination: See above findings.      [place] Adult PT Treatment/Exercise - 11/04/19 0001      Exercises   Exercises  Knee/Hip      Knee/Hip Exercises: Standing   Heel Raises  Both;2 sets;10 reps    Hip Abduction  Stengthening;Both;2 sets;10 reps    Hip Extension  Stengthening;Both;2 sets;10 reps      Knee/Hip Exercises: Seated   Long Arc Quad  Strengthening;Both;2 sets;10 reps    Knee/Hip Flexion  seated marches 2x10             PT Education - 11/04/19 1310    Education Details  Pt educated on condition, rehab process, and HEP    Person(s) Educated  Patient    Methods  Explanation;Demonstration;Handout    Comprehension  Verbalized understanding;Returned demonstration       PT Short Term Goals - 11/04/19 1401      PT SHORT TERM GOAL #1   Title  Pt will be independent with HEP     Time  2    Period  Weeks    Target Date  11/18/19        PT Long Term Goals - 11/04/19 1402      PT LONG TERM GOAL #1   Title  Pt will demonstrate ability to walk 1 lap around building with no AD    Time  8    Period  Weeks    Status  New    Target Date  12/30/19      PT LONG TERM GOAL #2   Title  Pt will demonstrate TUG <12 sec with no AD    Baseline  19 sec with no AD; 15 sec with SPC    Time  8    Period  Weeks    Status  New    Target Date  12/30/19      PT LONG TERM GOAL #3   Title  Pt will demonstrate RLE MMT equivalent to LLE    Time  8    Period  Weeks    Status  New    Target Date  12/30/19      PT LONG TERM GOAL #4   Title  Pt will report  reduction in RLE pain by 50%    Time  8    Period  Weeks    Status  New    Target Date  12/30/19             Plan - 11/04/19 1317    Clinical Impression Statement  Pt presents to PT s/p surgery for R femur fracture on 05/09/20; pt was discharged to a SNF and received PT there before being d/c home. In clinic, pt demonstrates increased R hip/leg pain, decreased RLE flexibility, ambulating with gait deviations w/out AD, diminished gait speed, balance deficits, and RLE strength deficits. Pt would benefit from skilled PT to address the above impairments.    Personal Factors and Comorbidities  Comorbidity 3+    Comorbidities  diabetes, HTN, gastroparesis    Examination-Activity Limitations  Bend;Carry;Lift;Stand;Stairs;Squat;Sit    Examination-Participation Restrictions  Meal Prep;Cleaning;Community Activity;Shop;Laundry;Interpersonal Relationship    Stability/Clinical Decision Making  Stable/Uncomplicated    Clinical Decision Making  Moderate    Rehab Potential  Good    PT Frequency  2x / week    PT Duration  8 weeks    PT Treatment/Interventions  ADLs/Self Care Home Management;Electrical Stimulation;Moist Heat;Iontophoresis 4mg /ml Dexamethasone;Cryotherapy;Gait training;Stair training;Functional mobility  training;Therapeutic activities;Therapeutic exercise;Balance training;Neuromuscular re-education;Manual techniques;Patient/family education;Passive range of motion;Dry needling;Vasopneumatic Device    PT Next Visit Plan  Review HEP, Initiate LE strengthening/flexibility, manual/modalities as indicated    PT Home Exercise Plan  standing hip abduction/extension/heel raises, LAQ, seated marches    Consulted and Agree with Plan of Care  Patient       Patient will benefit from skilled therapeutic intervention in order to improve the following deficits and impairments:  Abnormal gait, Decreased range of motion, Difficulty walking, Decreased endurance, Increased muscle spasms, Decreased activity tolerance, Pain, Decreased balance, Hypomobility, Impaired flexibility, Improper body mechanics, Decreased mobility, Decreased strength  Visit Diagnosis: Muscle weakness (generalized)  Pain in right hip  Localized edema  Pain in left leg     Problem List Patient Active Problem List   Diagnosis Date Noted  . Closed fracture of proximal end of femur, right, initial encounter ([place]) 05/09/2019  . CAD (coronary artery disease) 03/06/2019  . Acute encephalopathy 12/22/2018  . Diabetic ketoacidosis ([place]) 04/30/2018  . Gastroparesis 04/13/2018  . DKA (diabetic ketoacidosis) ([place]) 03/31/2018  . S/P CABG x 3 01/14/2018  . CAD (coronary artery disease), native coronary artery 01/11/2018  . Dehydration   . MDD (major depressive disorder), single episode, severe , no psychosis ([place])   . DKA (diabetic ketoacidoses) ([place]) 06/16/2017  . Tardive dyskinesia   . Type 1 diabetes mellitus with hyperlipidemia ([place]) 03/18/2016  . Noncompliance with treatment plan 03/13/2016  . Essential hypertension 01/24/2016  . HLD (hyperlipidemia)   . GERD (gastroesophageal reflux disease)   . AKI (acute kidney injury) ([place])   . Anemia, iron deficiency   . Vitamin B12 deficiency 08/16/2015  . Diabetic gastroparesis ([place])    . Diabetic neuropathy, type I diabetes mellitus ([place]) 05/18/2015  . Anxiety and depression 05/18/2015   Amador Cunas, PT, DPT Donald Prose Kattia Selley 11/04/2019, 2:03 PM  [address] [place], [place], [address] Phone: [phone]   Fax:  [phone]  Name: [name] MRN: [id number] Date of Birth: 1965-12-21

## 2019-11-04 NOTE — Telephone Encounter (Signed)
Results are not back yet. Patient will be called once results are in system.

## 2019-11-04 NOTE — Telephone Encounter (Signed)
Patient called in and requested for urine culture results. Please follow up at your earliest convenience.

## 2019-11-06 DIAGNOSIS — N183 Chronic kidney disease, stage 3 unspecified: Secondary | ICD-10-CM | POA: Diagnosis not present

## 2019-11-06 DIAGNOSIS — R109 Unspecified abdominal pain: Secondary | ICD-10-CM | POA: Diagnosis not present

## 2019-11-06 DIAGNOSIS — I2510 Atherosclerotic heart disease of native coronary artery without angina pectoris: Secondary | ICD-10-CM | POA: Diagnosis not present

## 2019-11-06 DIAGNOSIS — R1111 Vomiting without nausea: Secondary | ICD-10-CM | POA: Diagnosis not present

## 2019-11-06 DIAGNOSIS — I129 Hypertensive chronic kidney disease with stage 1 through stage 4 chronic kidney disease, or unspecified chronic kidney disease: Secondary | ICD-10-CM | POA: Diagnosis not present

## 2019-11-06 DIAGNOSIS — R1084 Generalized abdominal pain: Secondary | ICD-10-CM | POA: Diagnosis not present

## 2019-11-06 DIAGNOSIS — K3184 Gastroparesis: Secondary | ICD-10-CM | POA: Diagnosis not present

## 2019-11-06 DIAGNOSIS — I10 Essential (primary) hypertension: Secondary | ICD-10-CM | POA: Diagnosis not present

## 2019-11-06 DIAGNOSIS — R1110 Vomiting, unspecified: Secondary | ICD-10-CM | POA: Diagnosis not present

## 2019-11-06 DIAGNOSIS — Z794 Long term (current) use of insulin: Secondary | ICD-10-CM | POA: Diagnosis not present

## 2019-11-06 DIAGNOSIS — E1022 Type 1 diabetes mellitus with diabetic chronic kidney disease: Secondary | ICD-10-CM | POA: Diagnosis not present

## 2019-11-06 DIAGNOSIS — E872 Acidosis: Secondary | ICD-10-CM | POA: Diagnosis not present

## 2019-11-06 DIAGNOSIS — R112 Nausea with vomiting, unspecified: Secondary | ICD-10-CM | POA: Diagnosis not present

## 2019-11-06 DIAGNOSIS — E1043 Type 1 diabetes mellitus with diabetic autonomic (poly)neuropathy: Secondary | ICD-10-CM | POA: Diagnosis not present

## 2019-11-06 DIAGNOSIS — F419 Anxiety disorder, unspecified: Secondary | ICD-10-CM | POA: Diagnosis not present

## 2019-11-06 DIAGNOSIS — R52 Pain, unspecified: Secondary | ICD-10-CM | POA: Diagnosis not present

## 2019-11-06 DIAGNOSIS — R110 Nausea: Secondary | ICD-10-CM | POA: Diagnosis not present

## 2019-11-06 DIAGNOSIS — E1065 Type 1 diabetes mellitus with hyperglycemia: Secondary | ICD-10-CM | POA: Diagnosis not present

## 2019-11-06 DIAGNOSIS — E1010 Type 1 diabetes mellitus with ketoacidosis without coma: Secondary | ICD-10-CM | POA: Diagnosis not present

## 2019-11-06 LAB — URINE CULTURE

## 2019-11-08 ENCOUNTER — Other Ambulatory Visit: Payer: Self-pay | Admitting: Family Medicine

## 2019-11-08 MED ORDER — NITROFURANTOIN MONOHYD MACRO 100 MG PO CAPS: 100.0000 mg | ORAL_CAPSULE | Freq: Two times a day (BID) | ORAL | 0 refills | Status: DC

## 2019-11-08 MED ORDER — DEXTROSE 10 % IV SOLN
125.00 | INTRAVENOUS | Status: DC
Start: ? — End: 2019-11-08

## 2019-11-08 MED ORDER — BUSPIRONE HCL 10 MG PO TABS
10.00 | ORAL_TABLET | ORAL | Status: DC
Start: 2019-11-09 — End: 2019-11-08

## 2019-11-08 MED ORDER — PANTOPRAZOLE SODIUM 40 MG PO TBEC
40.00 | DELAYED_RELEASE_TABLET | ORAL | Status: DC
Start: 2019-11-09 — End: 2019-11-08

## 2019-11-08 MED ORDER — INSULIN GLARGINE 100 UNIT/ML ~~LOC~~ SOLN
10.00 | SUBCUTANEOUS | Status: DC
Start: 2019-11-08 — End: 2019-11-08

## 2019-11-08 MED ORDER — ACETAMINOPHEN 325 MG PO TABS
650.00 | ORAL_TABLET | ORAL | Status: DC
Start: ? — End: 2019-11-08

## 2019-11-08 MED ORDER — ATORVASTATIN CALCIUM 40 MG PO TABS
40.00 | ORAL_TABLET | ORAL | Status: DC
Start: 2019-11-10 — End: 2019-11-08

## 2019-11-08 MED ORDER — ENOXAPARIN SODIUM 40 MG/0.4ML ~~LOC~~ SOLN
40.00 | SUBCUTANEOUS | Status: DC
Start: 2019-11-10 — End: 2019-11-08

## 2019-11-08 MED ORDER — TEMAZEPAM 15 MG PO CAPS
15.00 | ORAL_CAPSULE | ORAL | Status: DC
Start: ? — End: 2019-11-08

## 2019-11-08 MED ORDER — INSULIN LISPRO 100 UNIT/ML ~~LOC~~ SOLN
2.00 | SUBCUTANEOUS | Status: DC
Start: 2019-11-10 — End: 2019-11-08

## 2019-11-08 MED ORDER — METOCLOPRAMIDE HCL 5 MG/ML IJ SOLN
10.00 | INTRAMUSCULAR | Status: DC
Start: 2019-11-08 — End: 2019-11-08

## 2019-11-08 MED ORDER — POLYETHYLENE GLYCOL 3350 17 GM/SCOOP PO POWD
17.00 | ORAL | Status: DC
Start: 2019-11-10 — End: 2019-11-08

## 2019-11-08 MED ORDER — CLONIDINE HCL 0.1 MG PO TABS
0.10 | ORAL_TABLET | ORAL | Status: DC
Start: ? — End: 2019-11-08

## 2019-11-08 MED ORDER — LOSARTAN POTASSIUM 50 MG PO TABS
100.00 | ORAL_TABLET | ORAL | Status: DC
Start: 2019-11-10 — End: 2019-11-08

## 2019-11-08 MED ORDER — BISACODYL 10 MG RE SUPP
10.00 | RECTAL | Status: DC
Start: ? — End: 2019-11-08

## 2019-11-08 MED ORDER — ASPIRIN 81 MG PO TBEC
81.00 | DELAYED_RELEASE_TABLET | ORAL | Status: DC
Start: 2019-11-10 — End: 2019-11-08

## 2019-11-08 MED ORDER — GLUCOSE 40 % PO GEL
15.00 | ORAL | Status: DC
Start: ? — End: 2019-11-08

## 2019-11-08 MED ORDER — HYDROCODONE-ACETAMINOPHEN 5-325 MG PO TABS
2.00 | ORAL_TABLET | ORAL | Status: DC
Start: ? — End: 2019-11-08

## 2019-11-08 MED ORDER — GLUCAGON (RDNA) 1 MG IJ KIT
1.00 | PACK | INTRAMUSCULAR | Status: DC
Start: ? — End: 2019-11-08

## 2019-11-08 MED ORDER — ONDANSETRON HCL 4 MG/2ML IJ SOLN
4.00 | INTRAMUSCULAR | Status: DC
Start: ? — End: 2019-11-08

## 2019-11-08 MED ORDER — GENERIC EXTERNAL MEDICATION
12.50 | Status: DC
Start: 2019-11-09 — End: 2019-11-08

## 2019-11-08 MED ORDER — PREGABALIN 75 MG PO CAPS
150.00 | ORAL_CAPSULE | ORAL | Status: DC
Start: 2019-11-09 — End: 2019-11-08

## 2019-11-09 ENCOUNTER — Telehealth: Payer: Self-pay

## 2019-11-09 NOTE — Telephone Encounter (Signed)
-----   Message from [name], MD sent at 11/08/2019  3:05 PM EDT ----- Urine culture reveals a UTI with a bacteria that is resistant to many antibiotics which could explain her recurrent UTI. I have sent a prescription for Macrobid to her Pharmacy.

## 2019-11-09 NOTE — Telephone Encounter (Signed)
Patient was called to go over lab results, message states that patient's mailbox is full.

## 2019-11-10 ENCOUNTER — Ambulatory Visit: Payer: Medicare HMO | Admitting: Physical Therapy

## 2019-11-10 MED ORDER — INSULIN GLARGINE 100 UNIT/ML ~~LOC~~ SOLN
10.00 | SUBCUTANEOUS | Status: DC
Start: 2019-11-09 — End: 2019-11-10

## 2019-11-10 MED ORDER — ONDANSETRON 4 MG PO TBDP
4.00 | ORAL_TABLET | ORAL | Status: DC
Start: ? — End: 2019-11-10

## 2019-11-10 NOTE — Telephone Encounter (Signed)
-----   Message from [name], MD sent at 11/08/2019  3:05 PM EDT ----- Urine culture reveals a UTI with a bacteria that is resistant to many antibiotics which could explain her recurrent UTI. I have sent a prescription for Macrobid to her Pharmacy.

## 2019-11-11 ENCOUNTER — Other Ambulatory Visit: Payer: Self-pay
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
## 2019-11-11 NOTE — Telephone Encounter (Signed)
Patient name and DOB has been verified Patient was informed of lab results. Patient had no questions.  

## 2019-11-18 ENCOUNTER — Encounter: Payer: Medicare HMO | Admitting: Physical Therapy

## 2019-11-19 DIAGNOSIS — E860 Dehydration: Secondary | ICD-10-CM | POA: Diagnosis not present

## 2019-11-19 DIAGNOSIS — E1065 Type 1 diabetes mellitus with hyperglycemia: Secondary | ICD-10-CM | POA: Diagnosis not present

## 2019-11-19 DIAGNOSIS — E1043 Type 1 diabetes mellitus with diabetic autonomic (poly)neuropathy: Secondary | ICD-10-CM | POA: Diagnosis not present

## 2019-11-19 DIAGNOSIS — N179 Acute kidney failure, unspecified: Secondary | ICD-10-CM | POA: Diagnosis not present

## 2019-11-19 DIAGNOSIS — R0689 Other abnormalities of breathing: Secondary | ICD-10-CM | POA: Diagnosis not present

## 2019-11-19 DIAGNOSIS — I2510 Atherosclerotic heart disease of native coronary artery without angina pectoris: Secondary | ICD-10-CM | POA: Diagnosis not present

## 2019-11-19 DIAGNOSIS — K3184 Gastroparesis: Secondary | ICD-10-CM | POA: Diagnosis not present

## 2019-11-19 DIAGNOSIS — E1165 Type 2 diabetes mellitus with hyperglycemia: Secondary | ICD-10-CM | POA: Diagnosis not present

## 2019-11-19 DIAGNOSIS — R000 Tachycardia, unspecified: Secondary | ICD-10-CM | POA: Diagnosis not present

## 2019-11-19 DIAGNOSIS — R112 Nausea with vomiting, unspecified: Secondary | ICD-10-CM | POA: Diagnosis not present

## 2019-11-19 DIAGNOSIS — R1111 Vomiting without nausea: Secondary | ICD-10-CM | POA: Diagnosis not present

## 2019-11-19 DIAGNOSIS — E119 Type 2 diabetes mellitus without complications: Secondary | ICD-10-CM | POA: Diagnosis not present

## 2019-11-19 DIAGNOSIS — K449 Diaphragmatic hernia without obstruction or gangrene: Secondary | ICD-10-CM | POA: Diagnosis not present

## 2019-11-19 DIAGNOSIS — N200 Calculus of kidney: Secondary | ICD-10-CM | POA: Diagnosis not present

## 2019-11-19 DIAGNOSIS — F419 Anxiety disorder, unspecified: Secondary | ICD-10-CM | POA: Diagnosis not present

## 2019-11-19 DIAGNOSIS — D72829 Elevated white blood cell count, unspecified: Secondary | ICD-10-CM | POA: Diagnosis not present

## 2019-11-19 DIAGNOSIS — R809 Proteinuria, unspecified: Secondary | ICD-10-CM | POA: Diagnosis not present

## 2019-11-19 DIAGNOSIS — R109 Unspecified abdominal pain: Secondary | ICD-10-CM | POA: Diagnosis not present

## 2019-11-19 DIAGNOSIS — Z794 Long term (current) use of insulin: Secondary | ICD-10-CM | POA: Diagnosis not present

## 2019-11-19 DIAGNOSIS — I10 Essential (primary) hypertension: Secondary | ICD-10-CM | POA: Diagnosis not present

## 2019-11-20 ENCOUNTER — Ambulatory Visit: Payer: Medicare HMO | Admitting: Physical Therapy

## 2019-11-20 DIAGNOSIS — I2510 Atherosclerotic heart disease of native coronary artery without angina pectoris: Secondary | ICD-10-CM | POA: Diagnosis not present

## 2019-11-20 DIAGNOSIS — F419 Anxiety disorder, unspecified: Secondary | ICD-10-CM | POA: Diagnosis not present

## 2019-11-20 DIAGNOSIS — I10 Essential (primary) hypertension: Secondary | ICD-10-CM | POA: Diagnosis not present

## 2019-11-20 DIAGNOSIS — E1065 Type 1 diabetes mellitus with hyperglycemia: Secondary | ICD-10-CM | POA: Diagnosis not present

## 2019-11-20 DIAGNOSIS — D72829 Elevated white blood cell count, unspecified: Secondary | ICD-10-CM | POA: Diagnosis not present

## 2019-11-20 DIAGNOSIS — R809 Proteinuria, unspecified: Secondary | ICD-10-CM | POA: Diagnosis not present

## 2019-11-20 DIAGNOSIS — E1043 Type 1 diabetes mellitus with diabetic autonomic (poly)neuropathy: Secondary | ICD-10-CM | POA: Diagnosis not present

## 2019-11-20 DIAGNOSIS — N200 Calculus of kidney: Secondary | ICD-10-CM | POA: Diagnosis not present

## 2019-11-20 DIAGNOSIS — N179 Acute kidney failure, unspecified: Secondary | ICD-10-CM | POA: Diagnosis not present

## 2019-11-20 DIAGNOSIS — K3184 Gastroparesis: Secondary | ICD-10-CM | POA: Diagnosis not present

## 2019-11-20 DIAGNOSIS — E860 Dehydration: Secondary | ICD-10-CM | POA: Diagnosis not present

## 2019-11-20 DIAGNOSIS — E109 Type 1 diabetes mellitus without complications: Secondary | ICD-10-CM | POA: Diagnosis not present

## 2019-11-21 DIAGNOSIS — K3184 Gastroparesis: Secondary | ICD-10-CM | POA: Diagnosis not present

## 2019-11-21 DIAGNOSIS — E860 Dehydration: Secondary | ICD-10-CM | POA: Diagnosis not present

## 2019-11-21 DIAGNOSIS — E1043 Type 1 diabetes mellitus with diabetic autonomic (poly)neuropathy: Secondary | ICD-10-CM | POA: Diagnosis not present

## 2019-11-21 DIAGNOSIS — E1065 Type 1 diabetes mellitus with hyperglycemia: Secondary | ICD-10-CM | POA: Diagnosis not present

## 2019-11-21 DIAGNOSIS — I2510 Atherosclerotic heart disease of native coronary artery without angina pectoris: Secondary | ICD-10-CM | POA: Diagnosis not present

## 2019-11-21 DIAGNOSIS — N179 Acute kidney failure, unspecified: Secondary | ICD-10-CM | POA: Diagnosis not present

## 2019-11-21 DIAGNOSIS — N200 Calculus of kidney: Secondary | ICD-10-CM | POA: Diagnosis not present

## 2019-11-21 DIAGNOSIS — E109 Type 1 diabetes mellitus without complications: Secondary | ICD-10-CM | POA: Diagnosis not present

## 2019-11-21 DIAGNOSIS — F419 Anxiety disorder, unspecified: Secondary | ICD-10-CM | POA: Diagnosis not present

## 2019-11-21 DIAGNOSIS — I10 Essential (primary) hypertension: Secondary | ICD-10-CM | POA: Diagnosis not present

## 2019-11-21 DIAGNOSIS — R809 Proteinuria, unspecified: Secondary | ICD-10-CM | POA: Diagnosis not present

## 2019-11-25 ENCOUNTER — Other Ambulatory Visit: Payer: Self-pay

## 2019-11-25 ENCOUNTER — Encounter: Payer: Self-pay | Admitting: Physical Therapy

## 2019-11-25 ENCOUNTER — Ambulatory Visit: Payer: Medicare HMO | Attending: Orthopedic Surgery | Admitting: Physical Therapy

## 2019-11-25 DIAGNOSIS — R600 Localized edema: Secondary | ICD-10-CM | POA: Insufficient documentation

## 2019-11-25 DIAGNOSIS — M6281 Muscle weakness (generalized): Secondary | ICD-10-CM | POA: Diagnosis not present

## 2019-11-25 DIAGNOSIS — M25551 Pain in right hip: Secondary | ICD-10-CM | POA: Insufficient documentation

## 2019-11-25 NOTE — Therapy (Signed)
[place], [place], [address] Phone: [phone]   Fax:  [phone]  Physical Therapy Treatment  Patient Details  Name: [name] MRN: [id number] Date of Birth: 1965-10-09 Referring Provider (PT): [name]   Encounter Date: 11/25/2019  PT End of Session - 11/25/19 1145    Visit Number  2    Number of Visits  12    Date for PT Re-Evaluation  01/04/20    PT Start Time  1100    PT Stop Time  1145    PT Time Calculation (min)  45 min    Activity Tolerance  Patient limited by fatigue    Behavior During Therapy  [facility] for tasks assessed/performed       Past Medical History:  Diagnosis Date  . AKI (acute kidney injury) ([place])   . Anemia, iron deficiency   . Anxiety and depression 05/18/2015  . CAD (coronary artery disease), native coronary artery 01/11/2018   s/p CABG - Non-STEMI with severe three-vessel disease noted July 2019  . Depression   . Diabetic gastroparesis ([place])   . Diabetic neuropathy, type I diabetes mellitus ([place]) 05/18/2015  . DKA, type 1 ([place]) 03/18/2016  . Essential hypertension   . Gastroparesis   . GERD (gastroesophageal reflux disease)   . HLD (hyperlipidemia)   . MDD (major depressive disorder), single episode, severe , no psychosis ([place])   . Tardive dyskinesia   . Vitamin B12 deficiency 08/16/2015    Past Surgical History:  Procedure Laterality Date  . CARDIAC SURGERY    . COLONOSCOPY  09/27/2014   at [facility]  . CORONARY ARTERY BYPASS GRAFT N/A 01/14/2018   Procedure: CORONARY ARTERY BYPASS GRAFTING (CABG)X3, RIGHT AND LEFT SAPHENOUS VEIN HARVEST, MAMMARY TAKE DOWN. MAMMARY TO LAD, SVG TO PD, SVG TO DISTAL CIRC.;  Surgeon: Grace Isaac, MD;  Location: [place];  Service: Open Heart Surgery;  Laterality: N/A;  . ESOPHAGOGASTRODUODENOSCOPY  09/27/2014   at [facility], Dr Rolan Lipa. biospy neg for celiac, neg for H pylori.   . EYE SURGERY    . gailstones    .  INTRAMEDULLARY (IM) NAIL INTERTROCHANTERIC Right 05/10/2019   Procedure: INTRAMEDULLARY (IM) NAIL INTERTROCHANTRIC;  Surgeon: Lovell Sheehan, MD;  Location: [facility]S;  Service: Orthopedics;  Laterality: Right;  . IR FLUORO GUIDE CV LINE RIGHT  02/01/2017  . IR FLUORO GUIDE CV LINE RIGHT  03/06/2017  . IR FLUORO GUIDE CV LINE RIGHT  03/25/2017  . IR GENERIC HISTORICAL  01/24/2016   IR FLUORO GUIDE CV LINE RIGHT 01/24/2016 Darrell K Allred, PA-C [facility]  . IR GENERIC HISTORICAL  01/24/2016   IR US GUIDE VASC ACCESS RIGHT 01/24/2016 Darrell K Allred, PA-C [facility]  . IR US GUIDE VASC ACCESS RIGHT  02/01/2017  . IR US GUIDE VASC ACCESS RIGHT  03/06/2017  . IR US GUIDE VASC ACCESS RIGHT  03/25/2017  . IR US GUIDE VASC ACCESS RIGHT  01/20/2019  . IR VENIPUNCTURE 110YRS/OLDER BY MD  01/20/2019  . LEFT HEART CATH AND CORONARY ANGIOGRAPHY N/A 01/07/2018   Procedure: LEFT HEART CATH AND CORONARY ANGIOGRAPHY;  Surgeon: Troy Sine, MD;  Location: [place] CV LAB;  Service: Cardiovascular;  Laterality: N/A;  . POSTERIOR VITRECTOMY AND MEMBRANE PEEL-LEFT EYE  09/28/2002  . POSTERIOR VITRECTOMY AND MEMBRANE PEEL-RIGHT EYE  03/16/2002  . RETINAL DETACHMENT SURGERY    . TEE WITHOUT CARDIOVERSION N/A 01/14/2018   Procedure: TRANSESOPHAGEAL ECHOCARDIOGRAM (TEE);  Surgeon: Grace Isaac, MD;  Location: [place];  Service: Open Heart Surgery;  Laterality: N/A;    There were no vitals filed for this visit.  Subjective Assessment - 11/25/19 1102    Subjective  Pt returns to PT following hospitalization for gastroparesis flare. Pt reports feeling better since returning home; she has not been doing exercises d/t feeling sick. Pt reports that R LE is still painful.    Currently in Pain?  Yes    Pain Score  3     Pain Location  Hip    Pain Orientation  Right         [facility] PT Assessment - 11/25/19 0001      Assessment   Next MD Visit  12/03/2019                    [facility] Adult PT  Treatment/Exercise - 11/25/19 0001      Knee/Hip Exercises: Aerobic   Nustep  L3 x 7 min      Knee/Hip Exercises: Standing   Heel Raises  Both;2 sets;10 reps    Heel Raises Limitations  2.5#    Knee Flexion  Strengthening;Both;2 sets;10 reps    Knee Flexion Limitations  2.5#    Hip Abduction  Stengthening;Both;2 sets;10 reps    Abduction Limitations  2.5#    Hip Extension  Stengthening;Both;2 sets;10 reps    Extension Limitations  2.5#    Forward Step Up  Both;1 set;10 reps;Hand Hold: 1;Step Height: 4"    Forward Step Up Limitations  difficulty on R    Step Down  Both;1 set;10 reps;Hand Hold: 1;Step Height: 2"    Step Down Limitations  instability on R      Knee/Hip Exercises: Seated   Long Arc Quad  Strengthening;Both;2 sets;10 reps    Long Arc Quad Limitations  2.5#    Knee/Hip Flexion  seated marches 2x10 2.5# ankle weights    Sit to [employer]  15 reps;without UE support   3 sets; blue mat under RLE/LLE              PT Short Term Goals - 11/04/19 1401      PT SHORT TERM GOAL #1   Title  Pt will be independent with HEP    Time  2    Period  Weeks    Target Date  11/18/19        PT Long Term Goals - 11/04/19 1402      PT LONG TERM GOAL #1   Title  Pt will demonstrate ability to walk 1 lap around building with no AD    Time  8    Period  Weeks    Status  New    Target Date  12/30/19      PT LONG TERM GOAL #2   Title  Pt will demonstrate TUG <12 sec with no AD    Baseline  19 sec with no AD; 15 sec with SPC    Time  8    Period  Weeks    Status  New    Target Date  12/30/19      PT LONG TERM GOAL #3   Title  Pt will demonstrate RLE MMT equivalent to LLE    Time  8    Period  Weeks    Status  New    Target Date  12/30/19      PT LONG TERM GOAL #4   Title  Pt will  report reduction in RLE pain by 50%    Time  8    Period  Weeks    Status  New    Target Date  12/30/19            Plan - 11/25/19 1145    Clinical Impression Statement  Pt  presents to clinic after hospitalization for gastroparesis flare up. Pt was fatigued today, requiring frequent rest breaks between exercises. Pt tolerated progression to TE well; only complained of increased RLE pain with step down exercise. Noted some instability on RLE with step ups/downs requiring CGA. Continue to progress next rx.    PT Treatment/Interventions  ADLs/Self Care Home Management;Electrical Stimulation;Moist Heat;Iontophoresis 4mg /ml Dexamethasone;Cryotherapy;Gait training;Stair training;Functional mobility training;Therapeutic activities;Therapeutic exercise;Balance training;Neuromuscular re-education;Manual techniques;Patient/family education;Passive range of motion;Dry needling;Vasopneumatic Device    PT Next Visit Plan  Review HEP, Progress LE strengthening/flexibility, work on stair progression, manual/modalities as indicated    Oncologist with Plan of Care  Patient       Patient will benefit from skilled therapeutic intervention in order to improve the following deficits and impairments:  Abnormal gait, Decreased range of motion, Difficulty walking, Decreased endurance, Increased muscle spasms, Decreased activity tolerance, Pain, Decreased balance, Hypomobility, Impaired flexibility, Improper body mechanics, Decreased mobility, Decreased strength  Visit Diagnosis: Muscle weakness (generalized)  Pain in right hip  Localized edema     Problem List Patient Active Problem List   Diagnosis Date Noted  . Closed fracture of proximal end of femur, right, initial encounter ([place]) 05/09/2019  . CAD (coronary artery disease) 03/06/2019  . Acute encephalopathy 12/22/2018  . Diabetic ketoacidosis ([place]) 04/30/2018  . Gastroparesis 04/13/2018  . DKA (diabetic ketoacidosis) ([place]) 03/31/2018  . S/P CABG x 3 01/14/2018  . CAD (coronary artery disease), native coronary artery 01/11/2018  . Dehydration   . MDD (major depressive disorder), single episode, severe , no psychosis  ([place])   . DKA (diabetic ketoacidoses) ([place]) 06/16/2017  . Tardive dyskinesia   . Type 1 diabetes mellitus with hyperlipidemia ([place]) 03/18/2016  . Noncompliance with treatment plan 03/13/2016  . Essential hypertension 01/24/2016  . HLD (hyperlipidemia)   . GERD (gastroesophageal reflux disease)   . AKI (acute kidney injury) ([place])   . Anemia, iron deficiency   . Vitamin B12 deficiency 08/16/2015  . Diabetic gastroparesis ([place])   . Diabetic neuropathy, type I diabetes mellitus ([place]) 05/18/2015  . Anxiety and depression 05/18/2015   Amador Cunas, PT, DPT Donald Prose Lashann Hagg 11/25/2019, 11:48 AM  [address] [place], [place], [address] Phone: [phone]   Fax:  [phone]  Name: Jorgina Rodezno MRN: [id number] Date of Birth: 04/01/1966

## 2019-11-27 ENCOUNTER — Encounter: Payer: Medicare HMO | Admitting: Physical Therapy

## 2019-11-30 ENCOUNTER — Other Ambulatory Visit: Payer: Self-pay

## 2019-11-30 ENCOUNTER — Ambulatory Visit: Payer: Medicare HMO | Admitting: Physical Therapy

## 2019-11-30 ENCOUNTER — Encounter: Payer: Self-pay | Admitting: Physical Therapy

## 2019-11-30 DIAGNOSIS — M25551 Pain in right hip: Secondary | ICD-10-CM

## 2019-11-30 DIAGNOSIS — R600 Localized edema: Secondary | ICD-10-CM

## 2019-11-30 DIAGNOSIS — M6281 Muscle weakness (generalized): Secondary | ICD-10-CM | POA: Diagnosis not present

## 2019-11-30 IMAGING — DX PORTABLE CHEST - 1 VIEW
1 series · 1 of 1 positions shown · non-contrast
Comparison: 12/22/2018

CLINICAL DATA: Shortness of breath

EXAM:
PORTABLE CHEST 1 VIEW

[chest]
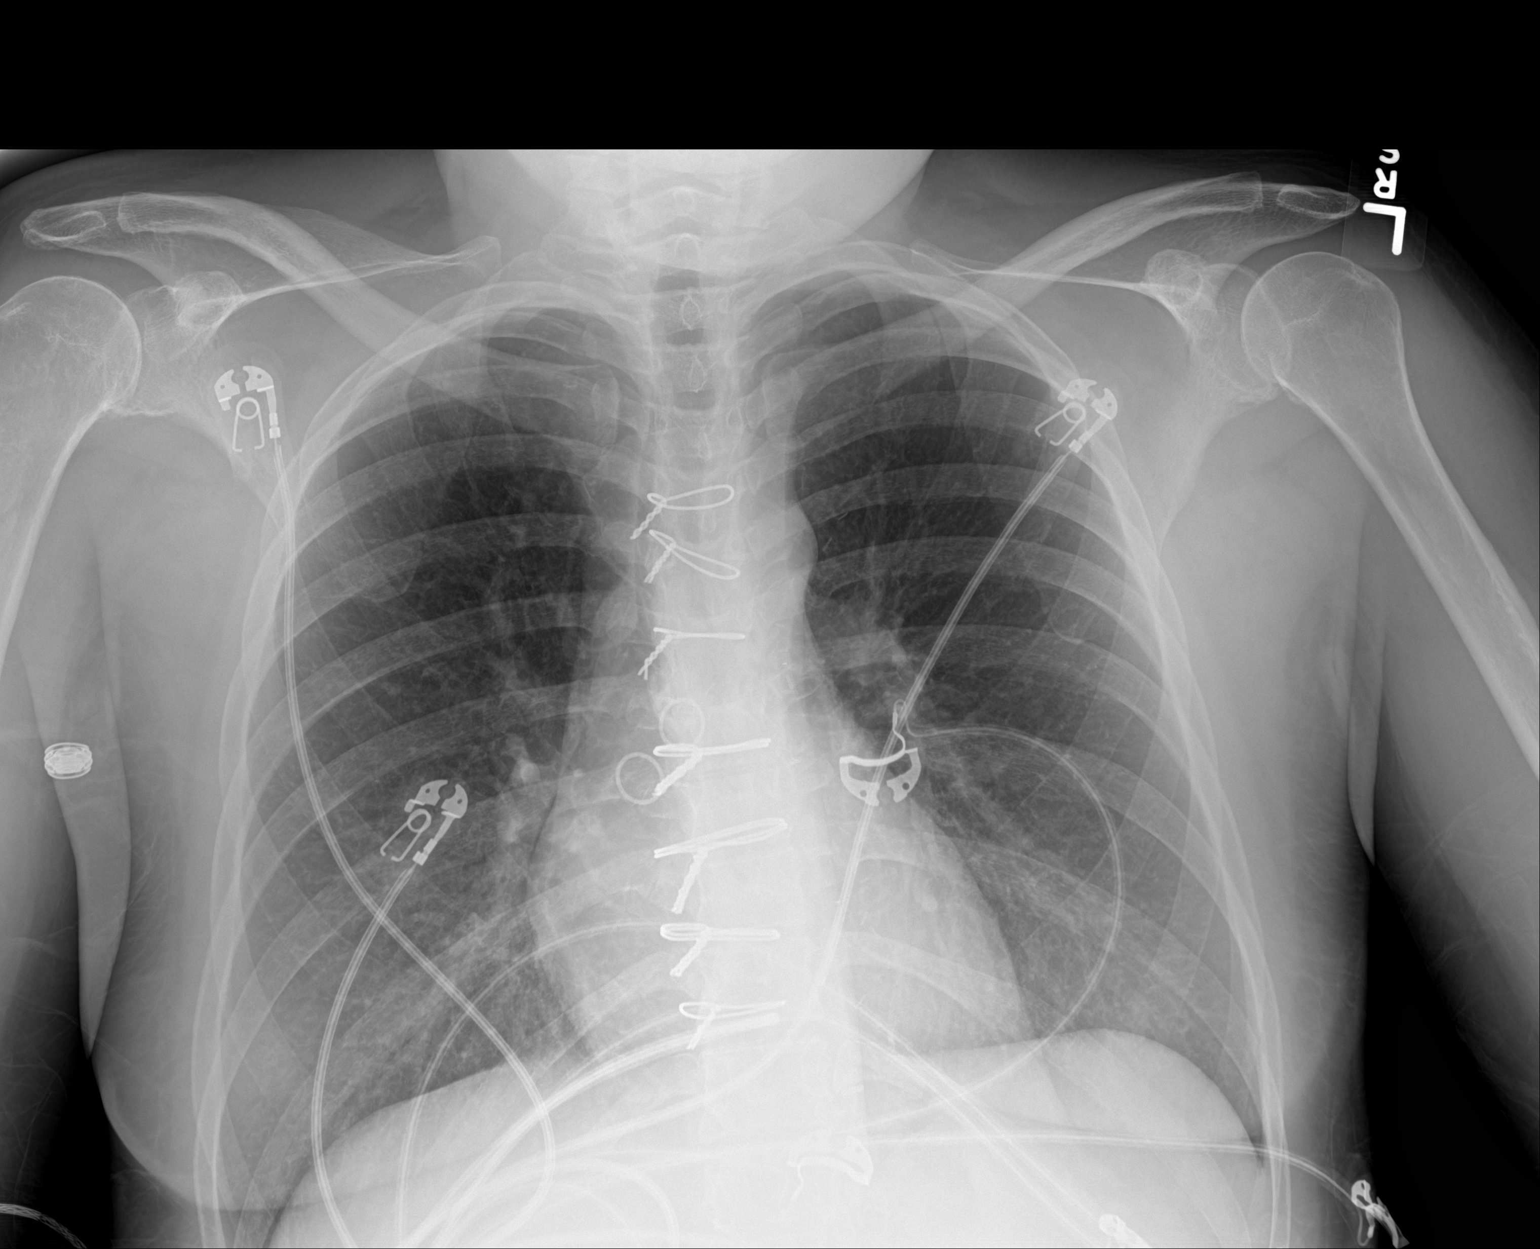

[1 of 1 positions shown; findings below may reference images not displayed]

FINDINGS: Cardiac shadows within normal limits. Postsurgical changes are
noted. Lungs are clear. No bony abnormality is noted.
IMPRESSION: No acute abnormality seen

## 2019-11-30 IMAGING — DX PORTABLE CHEST - 1 VIEW
1 series · 1 of 1 positions shown · non-contrast
Comparison: Prior chest x-ray 02/13/2019

CLINICAL DATA: 52-year-old female status post right IJ central line
placement.

EXAM:
PORTABLE CHEST 1 VIEW

[chest]
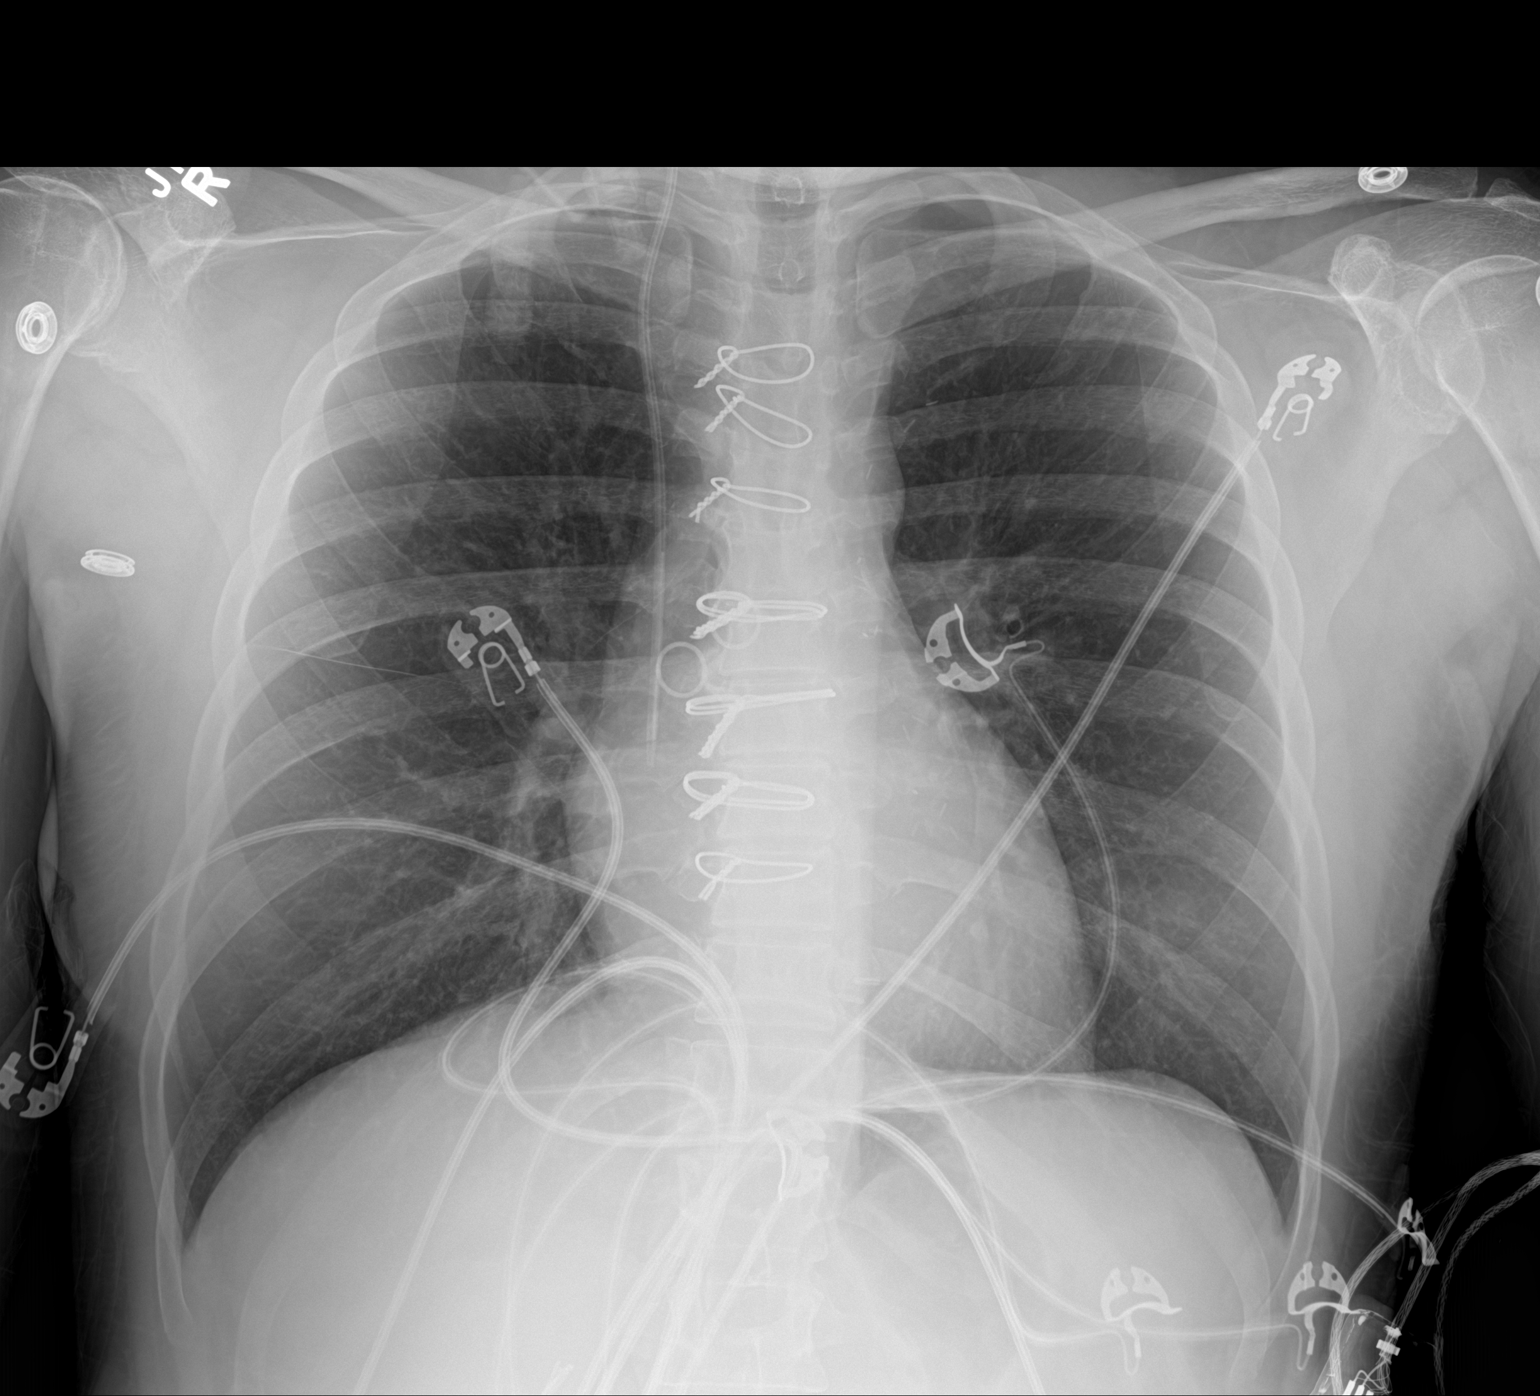

[1 of 1 positions shown; findings below may reference images not displayed]

FINDINGS: Interval placement of a right IJ approach central venous catheter.
The catheter tip is in good position overlying the cavoatrial
junction. Patient is status post median sternotomy with evidence of
prior multivessel CABG. The lungs are clear. No evidence of new
pleural effusion or pneumothorax. No acute osseous abnormality.
IMPRESSION: New right IJ central venous catheter appears to be in good position
with the tip overlying the cavoatrial junction.

No evidence of pneumothorax or pleural effusion.

## 2019-11-30 NOTE — Therapy (Signed)
[place], [place], [address] Phone: [phone]   Fax:  [phone]  Physical Therapy Treatment  Patient Details  Name: [name] MRN: [id number] Date of Birth: August 16, 1965 Referring Provider (PT): [name]   Encounter Date: 11/30/2019  PT End of Session - 11/30/19 9509    Visit Number  3    Number of Visits  12    Date for PT Re-Evaluation  01/04/20    PT Start Time  1106    PT Stop Time  1146    PT Time Calculation (min)  40 min    Activity Tolerance  Patient limited by fatigue    Behavior During Therapy  [facility] for tasks assessed/performed       Past Medical History:  Diagnosis Date  . AKI (acute kidney injury) ([place])   . Anemia, iron deficiency   . Anxiety and depression 05/18/2015  . CAD (coronary artery disease), native coronary artery 01/11/2018   s/p CABG - Non-STEMI with severe three-vessel disease noted July 2019  . Depression   . Diabetic gastroparesis ([place])   . Diabetic neuropathy, type I diabetes mellitus ([place]) 05/18/2015  . DKA, type 1 ([place]) 03/18/2016  . Essential hypertension   . Gastroparesis   . GERD (gastroesophageal reflux disease)   . HLD (hyperlipidemia)   . MDD (major depressive disorder), single episode, severe , no psychosis ([place])   . Tardive dyskinesia   . Vitamin B12 deficiency 08/16/2015    Past Surgical History:  Procedure Laterality Date  . CARDIAC SURGERY    . COLONOSCOPY  09/27/2014   at [facility]  . CORONARY ARTERY BYPASS GRAFT N/A 01/14/2018   Procedure: CORONARY ARTERY BYPASS GRAFTING (CABG)X3, RIGHT AND LEFT SAPHENOUS VEIN HARVEST, MAMMARY TAKE DOWN. MAMMARY TO LAD, SVG TO PD, SVG TO DISTAL CIRC.;  Surgeon: Grace Isaac, MD;  Location: [place];  Service: Open Heart Surgery;  Laterality: N/A;  . ESOPHAGOGASTRODUODENOSCOPY  09/27/2014   at [facility], Dr Rolan Lipa. biospy neg for celiac, neg for H pylori.   . EYE SURGERY    . gailstones    .  INTRAMEDULLARY (IM) NAIL INTERTROCHANTERIC Right 05/10/2019   Procedure: INTRAMEDULLARY (IM) NAIL INTERTROCHANTRIC;  Surgeon: Lovell Sheehan, MD;  Location: [facility];  Service: Orthopedics;  Laterality: Right;  . IR FLUORO GUIDE CV LINE RIGHT  02/01/2017  . IR FLUORO GUIDE CV LINE RIGHT  03/06/2017  . IR FLUORO GUIDE CV LINE RIGHT  03/25/2017  . IR GENERIC HISTORICAL  01/24/2016   IR FLUORO GUIDE CV LINE RIGHT 01/24/2016 Darrell K Allred, PA-C [facility]  . IR GENERIC HISTORICAL  01/24/2016   IR US GUIDE VASC ACCESS RIGHT 01/24/2016 Darrell K Allred, PA-C [facility]  . IR US GUIDE VASC ACCESS RIGHT  02/01/2017  . IR US GUIDE VASC ACCESS RIGHT  03/06/2017  . IR US GUIDE VASC ACCESS RIGHT  03/25/2017  . IR US GUIDE VASC ACCESS RIGHT  01/20/2019  . IR VENIPUNCTURE 37YRS/OLDER BY MD  01/20/2019  . LEFT HEART CATH AND CORONARY ANGIOGRAPHY N/A 01/07/2018   Procedure: LEFT HEART CATH AND CORONARY ANGIOGRAPHY;  Surgeon: Troy Sine, MD;  Location: [place] CV LAB;  Service: Cardiovascular;  Laterality: N/A;  . POSTERIOR VITRECTOMY AND MEMBRANE PEEL-LEFT EYE  09/28/2002  . POSTERIOR VITRECTOMY AND MEMBRANE PEEL-RIGHT EYE  03/16/2002  . RETINAL DETACHMENT SURGERY    . TEE WITHOUT CARDIOVERSION N/A 01/14/2018   Procedure: TRANSESOPHAGEAL ECHOCARDIOGRAM (TEE);  Surgeon: Grace Isaac, MD;  Location: [place];  Service: Open Heart Surgery;  Laterality: N/A;    There were no vitals filed for this visit.  Subjective Assessment - 11/30/19 1114    Subjective  Pt reports that her gastroparesis has been flaring a little but feels okay now. Pt states RLE is a little sore this morning but "not bad."    Currently in Pain?  Yes    Pain Score  3     Pain Location  Hip    Pain Orientation  Right                        [facility] Adult PT Treatment/Exercise - 11/30/19 0001      Ambulation/Gait   Stairs  Yes    Stairs Assistance  6: Modified independent (Device/Increase time)    Stair Management  Technique  One rail Right    Number of Stairs  13    Height of Stairs  6    Gait Comments  required HR and HH assist for descending stairs; just HR for ascending. step to pattern      Knee/Hip Exercises: Aerobic   Nustep  L5 x 8 min      Knee/Hip Exercises: Standing   Heel Raises  Both;2 sets;10 reps    Heel Raises Limitations  2.5#    Knee Flexion  Strengthening;Both;2 sets;10 reps    Knee Flexion Limitations  2.5#    Hip Abduction  Stengthening;Both;2 sets;10 reps    Abduction Limitations  2.5#    Hip Extension  Stengthening;Both;2 sets;10 reps    Extension Limitations  2.5#    Other Standing Knee Exercises  sit to stand with LLE raised 3x5    Other Standing Knee Exercises  gait with no AD and occasional HH assist x50 ft around clinic      Knee/Hip Exercises: Seated   Long Arc Quad  Strengthening;Both;2 sets;10 reps    Long Arc Quad Limitations  2.5#    Knee/Hip Flexion  seated marches 2x10 2.5# ankle weights               PT Short Term Goals - 11/04/19 1401      PT SHORT TERM GOAL #1   Title  Pt will be independent with HEP    Time  2    Period  Weeks    Target Date  11/18/19        PT Long Term Goals - 11/04/19 1402      PT LONG TERM GOAL #1   Title  Pt will demonstrate ability to walk 1 lap around building with no AD    Time  8    Period  Weeks    Status  New    Target Date  12/30/19      PT LONG TERM GOAL #2   Title  Pt will demonstrate TUG <12 sec with no AD    Baseline  19 sec with no AD; 15 sec with SPC    Time  8    Period  Weeks    Status  New    Target Date  12/30/19      PT LONG TERM GOAL #3   Title  Pt will demonstrate RLE MMT equivalent to LLE    Time  8    Period  Weeks    Status  New    Target Date  12/30/19      PT LONG TERM  GOAL #4   Title  Pt will report reduction in RLE pain by 50%    Time  8    Period  Weeks    Status  New    Target Date  12/30/19            Plan - 11/30/19 1306    Clinical Impression Statement   Pt continues to be limited by fatigue d/t flare up of gastroparesis. Pt anxious with stairs today but did well. Required HH assist plus rail descending with step to pattern; pt has trouble seeing stairs d/t visual impairment. Gait without AD around clinic with no LOB and occasional HH assist; pt did well but is working to build confidence without AD.    PT Treatment/Interventions  ADLs/Self Care Home Management;Electrical Stimulation;Moist Heat;Iontophoresis 4mg /ml Dexamethasone;Cryotherapy;Gait training;Stair training;Functional mobility training;Therapeutic activities;Therapeutic exercise;Balance training;Neuromuscular re-education;Manual techniques;Patient/family education;Passive range of motion;Dry needling;Vasopneumatic Device    PT Next Visit Plan  Review HEP, Progress LE strengthening/flexibility, work on stair progression, manual/modalities as indicated    Oncologist with Plan of Care  Patient       Patient will benefit from skilled therapeutic intervention in order to improve the following deficits and impairments:  Abnormal gait, Decreased range of motion, Difficulty walking, Decreased endurance, Increased muscle spasms, Decreased activity tolerance, Pain, Decreased balance, Hypomobility, Impaired flexibility, Improper body mechanics, Decreased mobility, Decreased strength  Visit Diagnosis: Muscle weakness (generalized)  Pain in right hip  Localized edema     Problem List Patient Active Problem List   Diagnosis Date Noted  . Closed fracture of proximal end of femur, right, initial encounter ([place]) 05/09/2019  . CAD (coronary artery disease) 03/06/2019  . Acute encephalopathy 12/22/2018  . Diabetic ketoacidosis ([place]) 04/30/2018  . Gastroparesis 04/13/2018  . DKA (diabetic ketoacidosis) ([place]) 03/31/2018  . S/P CABG x 3 01/14/2018  . CAD (coronary artery disease), native coronary artery 01/11/2018  . Dehydration   . MDD (major depressive disorder), single episode,  severe , no psychosis ([place])   . DKA (diabetic ketoacidoses) ([place]) 06/16/2017  . Tardive dyskinesia   . Type 1 diabetes mellitus with hyperlipidemia ([place]) 03/18/2016  . Noncompliance with treatment plan 03/13/2016  . Essential hypertension 01/24/2016  . HLD (hyperlipidemia)   . GERD (gastroesophageal reflux disease)   . AKI (acute kidney injury) ([place])   . Anemia, iron deficiency   . Vitamin B12 deficiency 08/16/2015  . Diabetic gastroparesis ([place])   . Diabetic neuropathy, type I diabetes mellitus ([place]) 05/18/2015  . Anxiety and depression 05/18/2015   Amador Cunas, PT, DPT Donald Prose Ankit Degregorio 11/30/2019, 1:09 PM  [address] [place], [place], [address] Phone: [phone]   Fax:  [phone]  Name: [name] MRN: [id number] Date of Birth: 1966-03-25

## 2019-12-02 ENCOUNTER — Other Ambulatory Visit: Payer: Self-pay

## 2019-12-02 ENCOUNTER — Ambulatory Visit: Payer: Medicare HMO | Admitting: Physical Therapy

## 2019-12-02 ENCOUNTER — Encounter: Payer: Self-pay | Admitting: Physical Therapy

## 2019-12-02 DIAGNOSIS — M6281 Muscle weakness (generalized): Secondary | ICD-10-CM | POA: Diagnosis not present

## 2019-12-02 DIAGNOSIS — M25551 Pain in right hip: Secondary | ICD-10-CM | POA: Diagnosis not present

## 2019-12-02 DIAGNOSIS — R600 Localized edema: Secondary | ICD-10-CM

## 2019-12-02 NOTE — Therapy (Signed)
[facility] [place], [place], [address] Phone: [phone]   Fax:  [phone]  Physical Therapy Treatment  Patient Details  Name: [name] MRN: [id number] Date of Birth: Dec 10, 1965 Referring Provider (PT): [name]   Encounter Date: 12/02/2019  PT End of Session - 12/02/19 1141    Visit Number  4    Date for PT Re-Evaluation  01/04/20    PT Start Time  1105    PT Stop Time  1144    PT Time Calculation (min)  39 min    Activity Tolerance  Patient tolerated treatment well;Patient limited by fatigue    Behavior During Therapy  [facility] for tasks assessed/performed       Past Medical History:  Diagnosis Date  . AKI (acute kidney injury) ([place])   . Anemia, iron deficiency   . Anxiety and depression 05/18/2015  . CAD (coronary artery disease), native coronary artery 01/11/2018   s/p CABG - Non-STEMI with severe three-vessel disease noted July 2019  . Depression   . Diabetic gastroparesis ([place])   . Diabetic neuropathy, type I diabetes mellitus ([place]) 05/18/2015  . DKA, type 1 ([place]) 03/18/2016  . Essential hypertension   . Gastroparesis   . GERD (gastroesophageal reflux disease)   . HLD (hyperlipidemia)   . MDD (major depressive disorder), single episode, severe , no psychosis ([place])   . Tardive dyskinesia   . Vitamin B12 deficiency 08/16/2015    Past Surgical History:  Procedure Laterality Date  . CARDIAC SURGERY    . COLONOSCOPY  09/27/2014   at [facility]  . CORONARY ARTERY BYPASS GRAFT N/A 01/14/2018   Procedure: CORONARY ARTERY BYPASS GRAFTING (CABG)X3, RIGHT AND LEFT SAPHENOUS VEIN HARVEST, MAMMARY TAKE DOWN. MAMMARY TO LAD, SVG TO PD, SVG TO DISTAL CIRC.;  Surgeon: Grace Isaac, MD;  Location: [facility];  Service: Open Heart Surgery;  Laterality: N/A;  . ESOPHAGOGASTRODUODENOSCOPY  09/27/2014   at [facility], Dr Rolan Lipa. biospy neg for celiac, neg for H pylori.   . EYE SURGERY    . gailstones     . INTRAMEDULLARY (IM) NAIL INTERTROCHANTERIC Right 05/10/2019   Procedure: INTRAMEDULLARY (IM) NAIL INTERTROCHANTRIC;  Surgeon: Lovell Sheehan, MD;  Location: [facility];  Service: Orthopedics;  Laterality: Right;  . IR FLUORO GUIDE CV LINE RIGHT  02/01/2017  . IR FLUORO GUIDE CV LINE RIGHT  03/06/2017  . IR FLUORO GUIDE CV LINE RIGHT  03/25/2017  . IR GENERIC HISTORICAL  01/24/2016   IR FLUORO GUIDE CV LINE RIGHT 01/24/2016 Darrell K Allred, PA-C [facility]  . IR GENERIC HISTORICAL  01/24/2016   IR US GUIDE VASC ACCESS RIGHT 01/24/2016 Darrell K Allred, PA-C [facility]  . IR US GUIDE VASC ACCESS RIGHT  02/01/2017  . IR US GUIDE VASC ACCESS RIGHT  03/06/2017  . IR US GUIDE VASC ACCESS RIGHT  03/25/2017  . IR US GUIDE VASC ACCESS RIGHT  01/20/2019  . IR VENIPUNCTURE 70YRS/OLDER BY MD  01/20/2019  . LEFT HEART CATH AND CORONARY ANGIOGRAPHY N/A 01/07/2018   Procedure: LEFT HEART CATH AND CORONARY ANGIOGRAPHY;  Surgeon: Troy Sine, MD;  Location: [place] CV [facility];  Service: Cardiovascular;  Laterality: N/A;  . POSTERIOR VITRECTOMY AND MEMBRANE PEEL-LEFT EYE  09/28/2002  . POSTERIOR VITRECTOMY AND MEMBRANE PEEL-RIGHT EYE  03/16/2002  . RETINAL DETACHMENT SURGERY    . TEE WITHOUT CARDIOVERSION N/A 01/14/2018   Procedure: TRANSESOPHAGEAL ECHOCARDIOGRAM (TEE);  Surgeon: Grace Isaac,  MD;  [facility];  Service: Open Heart Surgery;  Laterality: N/A;    There were no vitals filed for this visit.  Subjective Assessment - 12/02/19 1107    Subjective  Feeling ok    Currently in Pain?  Yes    Pain Score  5     Pain Location  --   knee & hip   Pain Orientation  Right                        [facility] Adult PT Treatment/Exercise - 12/02/19 0001      Knee/Hip Exercises: Aerobic   Recumbent Bike  L0 x3 min    Nustep  L4 x6 min      Knee/Hip Exercises: Machines for Strengthening   Cybex Leg Press  20lb 2x10, RLE no weight x10      Knee/Hip Exercises: Seated   Long Arc Quad   Right;2 sets;10 reps    Long Arc Quad Limitations  3    Sit to [employer]  2 sets;10 reps;without UE support               PT Short Term Goals - 11/04/19 1401      PT SHORT TERM GOAL #1   Title  Pt will be independent with HEP    Time  2    Period  Weeks    Target Date  11/18/19        PT Long Term Goals - 11/04/19 1402      PT LONG TERM GOAL #1   Title  Pt will demonstrate ability to walk 1 lap around building with no AD    Time  8    Period  Weeks    Status  New    Target Date  12/30/19      PT LONG TERM GOAL #2   Title  Pt will demonstrate TUG <12 sec with no AD    Baseline  19 sec with no AD; 15 sec with SPC    Time  8    Period  Weeks    Status  New    Target Date  12/30/19      PT LONG TERM GOAL #3   Title  Pt will demonstrate RLE MMT equivalent to LLE    Time  8    Period  Weeks    Status  New    Target Date  12/30/19      PT LONG TERM GOAL #4   Title  Pt will report reduction in RLE pain by 50%    Time  8    Period  Weeks    Status  New    Target Date  12/30/19            Plan - 12/02/19 1142    Clinical Impression Statement  Pt very hesitant to try new interventions but became comfortable once things progressed. Some compensation noted with sit to stands, cues needed to put more weight on her R side. No visual LOB with side steps working on hip strength and building confidence without AD. Some assist needed at times with leg press reps due to weakness.    Examination-Activity Limitations  Bend;Carry;Lift;Stand;Stairs;Squat;Sit    Examination-Participation Restrictions  Meal Prep;Cleaning;Community Activity;Shop;Laundry;Interpersonal Relationship    Stability/Clinical Decision Making  Stable/Uncomplicated    Rehab Potential  Good    PT Frequency  2x / week    PT Duration  8 weeks  PT Treatment/Interventions  ADLs/Self Care Home Management;Electrical Stimulation;Moist Heat;Iontophoresis 4mg /ml Dexamethasone;Cryotherapy;Gait training;Stair  training;Functional mobility training;Therapeutic activities;Therapeutic exercise;Balance training;Neuromuscular re-education;Manual techniques;Patient/family education;Passive range of motion;Dry needling;Vasopneumatic Device    PT Next Visit Plan  Progress LE strengthening/flexibility, work on stair progression, manual/modalities as indicated       Patient will benefit from skilled therapeutic intervention in order to improve the following deficits and impairments:  Abnormal gait, Decreased range of motion, Difficulty walking, Decreased endurance, Increased muscle spasms, Decreased activity tolerance, Pain, Decreased balance, Hypomobility, Impaired flexibility, Improper body mechanics, Decreased mobility, Decreased strength  Visit Diagnosis: Localized edema  Pain in right hip  Muscle weakness (generalized)     Problem List Patient Active Problem List   Diagnosis Date Noted  . Closed fracture of proximal end of femur, right, initial encounter ([place]) 05/09/2019  . CAD (coronary artery disease) 03/06/2019  . Acute encephalopathy 12/22/2018  . Diabetic ketoacidosis ([place]) 04/30/2018  . Gastroparesis 04/13/2018  . DKA (diabetic ketoacidosis) ([place]) 03/31/2018  . S/P CABG x 3 01/14/2018  . CAD (coronary artery disease), native coronary artery 01/11/2018  . Dehydration   . MDD (major depressive disorder), single episode, severe , no psychosis ([place])   . DKA (diabetic ketoacidoses) ([place]) 06/16/2017  . Tardive dyskinesia   . Type 1 diabetes mellitus with hyperlipidemia ([place]) 03/18/2016  . Noncompliance with treatment plan 03/13/2016  . Essential hypertension 01/24/2016  . HLD (hyperlipidemia)   . GERD (gastroesophageal reflux disease)   . AKI (acute kidney injury) ([place])   . Anemia, iron deficiency   . Vitamin B12 deficiency 08/16/2015  . Diabetic gastroparesis ([place])   . Diabetic neuropathy, type I diabetes mellitus ([place]) 05/18/2015  . Anxiety and depression 05/18/2015    Scot Jun, PTA 12/02/2019, 11:46 AM  [address] [place], [place], [address] Phone: [phone]   Fax:  [phone]  Name: Elon Lomeli MRN: [id number] Date of Birth: May 03, 1966

## 2019-12-03 ENCOUNTER — Ambulatory Visit: Payer: Medicare HMO | Admitting: Endocrinology

## 2019-12-03 ENCOUNTER — Other Ambulatory Visit: Payer: Self-pay

## 2019-12-03 DIAGNOSIS — I10 Essential (primary) hypertension: Secondary | ICD-10-CM

## 2019-12-03 MED ORDER — METOPROLOL TARTRATE 25 MG PO TABS: 12.5000 mg | ORAL_TABLET | Freq: Two times a day (BID) | ORAL | 2 refills | Status: DC

## 2019-12-04 DIAGNOSIS — M84351A Stress fracture, right femur, initial encounter for fracture: Secondary | ICD-10-CM | POA: Diagnosis not present

## 2019-12-04 DIAGNOSIS — R6889 Other general symptoms and signs: Secondary | ICD-10-CM | POA: Diagnosis not present

## 2019-12-07 ENCOUNTER — Encounter: Payer: Self-pay | Admitting: Physical Therapy

## 2019-12-07 ENCOUNTER — Ambulatory Visit: Payer: Medicare HMO | Admitting: Physical Therapy

## 2019-12-07 ENCOUNTER — Other Ambulatory Visit: Payer: Self-pay

## 2019-12-07 DIAGNOSIS — R600 Localized edema: Secondary | ICD-10-CM

## 2019-12-07 DIAGNOSIS — M6281 Muscle weakness (generalized): Secondary | ICD-10-CM

## 2019-12-07 DIAGNOSIS — M25551 Pain in right hip: Secondary | ICD-10-CM | POA: Diagnosis not present

## 2019-12-07 NOTE — Therapy (Signed)
[facility] [address] [place], [place], [address] Phone: [phone]   Fax:  [phone]  Physical Therapy Treatment  Patient Details  Name: Mayumi Summerson MRN: [id number] Date of Birth: 1966-02-20 Referring Provider (PT): [name]   Encounter Date: 12/07/2019   PT End of Session - 12/07/19 1151    Visit Number 5    Number of Visits 12    Date for PT Re-Evaluation 01/04/20    PT Start Time 1100    PT Stop Time 1145    PT Time Calculation (min) 45 min    Activity Tolerance Patient tolerated treatment well;Patient limited by fatigue    Behavior During Therapy [facility] for tasks assessed/performed           Past Medical History:  Diagnosis Date  . AKI (acute kidney injury) ([place])   . Anemia, iron deficiency   . Anxiety and depression 05/18/2015  . CAD (coronary artery disease), native coronary artery 01/11/2018   s/p CABG - Non-STEMI with severe three-vessel disease noted July 2019  . Depression   . Diabetic gastroparesis ([place])   . Diabetic neuropathy, type I diabetes mellitus ([place]) 05/18/2015  . DKA, type 1 ([place]) 03/18/2016  . Essential hypertension   . Gastroparesis   . GERD (gastroesophageal reflux disease)   . HLD (hyperlipidemia)   . MDD (major depressive disorder), single episode, severe , no psychosis ([place])   . Tardive dyskinesia   . Vitamin B12 deficiency 08/16/2015    Past Surgical History:  Procedure Laterality Date  . CARDIAC SURGERY    . COLONOSCOPY  09/27/2014   at [facility]  . CORONARY ARTERY BYPASS GRAFT N/A 01/14/2018   Procedure: CORONARY ARTERY BYPASS GRAFTING (CABG)X3, RIGHT AND LEFT SAPHENOUS VEIN HARVEST, MAMMARY TAKE DOWN. MAMMARY TO LAD, SVG TO PD, SVG TO DISTAL CIRC.;  Surgeon: Grace Isaac, MD;  Location: [facility];  Service: Open Heart Surgery;  Laterality: N/A;  . ESOPHAGOGASTRODUODENOSCOPY  09/27/2014   at [facility], Dr Rolan Lipa. biospy neg for celiac, neg for H pylori.   . EYE  SURGERY    . gailstones    . INTRAMEDULLARY (IM) NAIL INTERTROCHANTERIC Right 05/10/2019   Procedure: INTRAMEDULLARY (IM) NAIL INTERTROCHANTRIC;  Surgeon: Lovell Sheehan, MD;  Location: [facility];  Service: Orthopedics;  Laterality: Right;  . IR FLUORO GUIDE CV LINE RIGHT  02/01/2017  . IR FLUORO GUIDE CV LINE RIGHT  03/06/2017  . IR FLUORO GUIDE CV LINE RIGHT  03/25/2017  . IR GENERIC HISTORICAL  01/24/2016   IR FLUORO GUIDE CV LINE RIGHT 01/24/2016 Darrell K Allred, PA-C [facility]  . IR GENERIC HISTORICAL  01/24/2016   IR US GUIDE VASC ACCESS RIGHT 01/24/2016 Darrell K Allred, PA-C [facility]  . IR US GUIDE VASC ACCESS RIGHT  02/01/2017  . IR US GUIDE VASC ACCESS RIGHT  03/06/2017  . IR US GUIDE VASC ACCESS RIGHT  03/25/2017  . IR US GUIDE VASC ACCESS RIGHT  01/20/2019  . IR VENIPUNCTURE 36YRS/OLDER BY MD  01/20/2019  . LEFT HEART CATH AND CORONARY ANGIOGRAPHY N/A 01/07/2018   Procedure: LEFT HEART CATH AND CORONARY ANGIOGRAPHY;  Surgeon: Troy Sine, MD;  Location: [place] CV LAB;  Service: Cardiovascular;  Laterality: N/A;  . POSTERIOR VITRECTOMY AND MEMBRANE PEEL-LEFT EYE  09/28/2002  . POSTERIOR VITRECTOMY AND MEMBRANE PEEL-RIGHT EYE  03/16/2002  . RETINAL DETACHMENT SURGERY    . TEE WITHOUT CARDIOVERSION N/A 01/14/2018   Procedure: TRANSESOPHAGEAL ECHOCARDIOGRAM (TEE);  Surgeon: Grace Isaac, MD;  Location: [facility];  Service: Open Heart Surgery;  Laterality: N/A;    There were no vitals filed for this visit.   Subjective Assessment - 12/07/19 1106    Subjective Pt reports she has been feeling more pain in knees lately; comes in with referral from MD for more visits so that she can make up for ones missed d/t hospitalization    Currently in Pain? Yes    Pain Score 6     Pain Location Knee    Pain Orientation Right;Left                             [facility] Adult PT Treatment/Exercise - 12/07/19 0001      Knee/Hip Exercises: Aerobic   Recumbent Bike x4 min       Knee/Hip Exercises: Machines for Strengthening   Cybex Knee Extension 5# 3x5 BLE    Cybex Knee Flexion 20# 1x10 BLE, 5# 1x10 RLE    Cybex Leg Press 20lb 2x10, RLE no weight x10      Knee/Hip Exercises: Standing   Lateral Step Up Both;1 set;10 reps;Hand Hold: 1;Step Height: 4"    Forward Step Up Both;1 set;10 reps;Hand Hold: 1;Step Height: 4"    Other Standing Knee Exercises sit to stand with LLE raised 3x5                    PT Short Term Goals - 11/04/19 1401      PT SHORT TERM GOAL #1   Title Pt will be independent with HEP    Time 2    Period Weeks    Target Date 11/18/19             PT Long Term Goals - 11/04/19 1402      PT LONG TERM GOAL #1   Title Pt will demonstrate ability to walk 1 lap around building with no AD    Time 8    Period Weeks    Status New    Target Date 12/30/19      PT LONG TERM GOAL #2   Title Pt will demonstrate TUG <12 sec with no AD    Baseline 19 sec with no AD; 15 sec with SPC    Time 8    Period Weeks    Status New    Target Date 12/30/19      PT LONG TERM GOAL #3   Title Pt will demonstrate RLE MMT equivalent to LLE    Time 8    Period Weeks    Status New    Target Date 12/30/19      PT LONG TERM GOAL #4   Title Pt will report reduction in RLE pain by 50%    Time 8    Period Weeks    Status New    Target Date 12/30/19                 Plan - 12/07/19 1151    Clinical Impression Statement Pt tolerated progression of TE well; needed assist with leg press RLE d/t weakness. Pt originally [place] assist +2 for step ups but worked down to +1 as she became more confident in balance. Still working on confidence w/out AD.    PT Treatment/Interventions ADLs/Self Care Home Management;Electrical Stimulation;Moist Heat;Iontophoresis 4mg /ml Dexamethasone;Cryotherapy;Gait training;Stair training;Functional mobility training;Therapeutic activities;Therapeutic exercise;Balance training;Neuromuscular re-education;Manual  techniques;Patient/family education;Passive range of motion;Dry needling;Vasopneumatic Device  PT Next Visit Plan Progress LE strengthening/flexibility, work on stair progression, manual/modalities as indicated    Consulted and Agree with Plan of Care Patient           Patient will benefit from skilled therapeutic intervention in order to improve the following deficits and impairments:  Abnormal gait, Decreased range of motion, Difficulty walking, Decreased endurance, Increased muscle spasms, Decreased activity tolerance, Pain, Decreased balance, Hypomobility, Impaired flexibility, Improper body mechanics, Decreased mobility, Decreased strength  Visit Diagnosis: Localized edema  Pain in right hip  Muscle weakness (generalized)     Problem List Patient Active Problem List   Diagnosis Date Noted  . Closed fracture of proximal end of femur, right, initial encounter ([place]) 05/09/2019  . CAD (coronary artery disease) 03/06/2019  . Acute encephalopathy 12/22/2018  . Diabetic ketoacidosis ([place]) 04/30/2018  . Gastroparesis 04/13/2018  . DKA (diabetic ketoacidosis) ([place]) 03/31/2018  . S/P CABG x 3 01/14/2018  . CAD (coronary artery disease), native coronary artery 01/11/2018  . Dehydration   . MDD (major depressive disorder), single episode, severe , no psychosis ([place])   . DKA (diabetic ketoacidoses) ([place]) 06/16/2017  . Tardive dyskinesia   . Type 1 diabetes mellitus with hyperlipidemia ([place]) 03/18/2016  . Noncompliance with treatment plan 03/13/2016  . Essential hypertension 01/24/2016  . HLD (hyperlipidemia)   . GERD (gastroesophageal reflux disease)   . AKI (acute kidney injury) ([place])   . Anemia, iron deficiency   . Vitamin B12 deficiency 08/16/2015  . Diabetic gastroparesis ([place])   . Diabetic neuropathy, type I diabetes mellitus ([place]) 05/18/2015  . Anxiety and depression 05/18/2015   Amador Cunas, PT, DPT Donald Prose Shaylynne Lunt 12/07/2019, 11:54 AM  [address] [place], [place], [address] Phone: [phone]   Fax:  [phone]  Name: Nakira Litzau MRN: [id number] Date of Birth: March 09, 1966

## 2019-12-09 ENCOUNTER — Ambulatory Visit: Payer: Medicare HMO | Admitting: Physical Therapy

## 2019-12-09 ENCOUNTER — Telehealth: Payer: Self-pay | Admitting: Family Medicine

## 2019-12-09 ENCOUNTER — Other Ambulatory Visit: Payer: Self-pay | Admitting: Endocrinology

## 2019-12-09 DIAGNOSIS — E1065 Type 1 diabetes mellitus with hyperglycemia: Secondary | ICD-10-CM

## 2019-12-09 NOTE — Telephone Encounter (Signed)
Patient called saying that [facility] will not see her because she owes a balance. Patient would like to be referred out somewhere else. Please f/u

## 2019-12-10 NOTE — Telephone Encounter (Signed)
Noted   Sent to [facility]

## 2019-12-12 DIAGNOSIS — Z9641 Presence of insulin pump (external) (internal): Secondary | ICD-10-CM | POA: Diagnosis not present

## 2019-12-12 DIAGNOSIS — E1165 Type 2 diabetes mellitus with hyperglycemia: Secondary | ICD-10-CM | POA: Diagnosis not present

## 2019-12-12 DIAGNOSIS — K529 Noninfective gastroenteritis and colitis, unspecified: Secondary | ICD-10-CM | POA: Diagnosis not present

## 2019-12-12 DIAGNOSIS — R1110 Vomiting, unspecified: Secondary | ICD-10-CM | POA: Diagnosis not present

## 2019-12-12 DIAGNOSIS — Z7982 Long term (current) use of aspirin: Secondary | ICD-10-CM | POA: Diagnosis not present

## 2019-12-12 DIAGNOSIS — J984 Other disorders of lung: Secondary | ICD-10-CM | POA: Diagnosis not present

## 2019-12-12 DIAGNOSIS — Z79899 Other long term (current) drug therapy: Secondary | ICD-10-CM | POA: Diagnosis not present

## 2019-12-12 DIAGNOSIS — E1043 Type 1 diabetes mellitus with diabetic autonomic (poly)neuropathy: Secondary | ICD-10-CM | POA: Diagnosis not present

## 2019-12-12 DIAGNOSIS — R0689 Other abnormalities of breathing: Secondary | ICD-10-CM | POA: Diagnosis not present

## 2019-12-12 DIAGNOSIS — Z951 Presence of aortocoronary bypass graft: Secondary | ICD-10-CM | POA: Diagnosis not present

## 2019-12-12 DIAGNOSIS — Z3202 Encounter for pregnancy test, result negative: Secondary | ICD-10-CM | POA: Diagnosis not present

## 2019-12-12 DIAGNOSIS — F411 Generalized anxiety disorder: Secondary | ICD-10-CM | POA: Diagnosis not present

## 2019-12-12 DIAGNOSIS — R1084 Generalized abdominal pain: Secondary | ICD-10-CM | POA: Diagnosis not present

## 2019-12-12 DIAGNOSIS — Z794 Long term (current) use of insulin: Secondary | ICD-10-CM | POA: Diagnosis not present

## 2019-12-12 DIAGNOSIS — I700 Atherosclerosis of aorta: Secondary | ICD-10-CM | POA: Diagnosis not present

## 2019-12-12 DIAGNOSIS — R112 Nausea with vomiting, unspecified: Secondary | ICD-10-CM | POA: Diagnosis not present

## 2019-12-12 DIAGNOSIS — R110 Nausea: Secondary | ICD-10-CM | POA: Diagnosis not present

## 2019-12-12 DIAGNOSIS — E1010 Type 1 diabetes mellitus with ketoacidosis without coma: Secondary | ICD-10-CM | POA: Diagnosis not present

## 2019-12-12 DIAGNOSIS — R1111 Vomiting without nausea: Secondary | ICD-10-CM | POA: Diagnosis not present

## 2019-12-12 DIAGNOSIS — I10 Essential (primary) hypertension: Secondary | ICD-10-CM | POA: Diagnosis not present

## 2019-12-12 DIAGNOSIS — N200 Calculus of kidney: Secondary | ICD-10-CM | POA: Diagnosis not present

## 2019-12-12 DIAGNOSIS — K449 Diaphragmatic hernia without obstruction or gangrene: Secondary | ICD-10-CM | POA: Diagnosis not present

## 2019-12-12 DIAGNOSIS — K3184 Gastroparesis: Secondary | ICD-10-CM | POA: Diagnosis not present

## 2019-12-12 DIAGNOSIS — E1065 Type 1 diabetes mellitus with hyperglycemia: Secondary | ICD-10-CM | POA: Diagnosis not present

## 2019-12-12 DIAGNOSIS — R6510 Systemic inflammatory response syndrome (SIRS) of non-infectious origin without acute organ dysfunction: Secondary | ICD-10-CM | POA: Diagnosis not present

## 2019-12-12 DIAGNOSIS — I2510 Atherosclerotic heart disease of native coronary artery without angina pectoris: Secondary | ICD-10-CM | POA: Diagnosis not present

## 2019-12-13 DIAGNOSIS — Z951 Presence of aortocoronary bypass graft: Secondary | ICD-10-CM | POA: Diagnosis not present

## 2019-12-13 DIAGNOSIS — K3184 Gastroparesis: Secondary | ICD-10-CM | POA: Diagnosis not present

## 2019-12-13 DIAGNOSIS — E1065 Type 1 diabetes mellitus with hyperglycemia: Secondary | ICD-10-CM | POA: Diagnosis not present

## 2019-12-13 DIAGNOSIS — E1043 Type 1 diabetes mellitus with diabetic autonomic (poly)neuropathy: Secondary | ICD-10-CM | POA: Diagnosis not present

## 2019-12-13 DIAGNOSIS — I10 Essential (primary) hypertension: Secondary | ICD-10-CM | POA: Diagnosis not present

## 2019-12-13 DIAGNOSIS — Z794 Long term (current) use of insulin: Secondary | ICD-10-CM | POA: Diagnosis not present

## 2019-12-13 DIAGNOSIS — F411 Generalized anxiety disorder: Secondary | ICD-10-CM | POA: Diagnosis not present

## 2019-12-13 DIAGNOSIS — I2510 Atherosclerotic heart disease of native coronary artery without angina pectoris: Secondary | ICD-10-CM | POA: Diagnosis not present

## 2019-12-13 DIAGNOSIS — Z9641 Presence of insulin pump (external) (internal): Secondary | ICD-10-CM | POA: Diagnosis not present

## 2019-12-14 DIAGNOSIS — Z794 Long term (current) use of insulin: Secondary | ICD-10-CM | POA: Diagnosis not present

## 2019-12-14 DIAGNOSIS — I2510 Atherosclerotic heart disease of native coronary artery without angina pectoris: Secondary | ICD-10-CM | POA: Diagnosis not present

## 2019-12-14 DIAGNOSIS — E1065 Type 1 diabetes mellitus with hyperglycemia: Secondary | ICD-10-CM | POA: Diagnosis not present

## 2019-12-14 DIAGNOSIS — Z951 Presence of aortocoronary bypass graft: Secondary | ICD-10-CM | POA: Diagnosis not present

## 2019-12-14 DIAGNOSIS — Z9641 Presence of insulin pump (external) (internal): Secondary | ICD-10-CM | POA: Diagnosis not present

## 2019-12-14 DIAGNOSIS — D509 Iron deficiency anemia, unspecified: Secondary | ICD-10-CM | POA: Diagnosis not present

## 2019-12-14 DIAGNOSIS — K3184 Gastroparesis: Secondary | ICD-10-CM | POA: Diagnosis not present

## 2019-12-14 DIAGNOSIS — I10 Essential (primary) hypertension: Secondary | ICD-10-CM | POA: Diagnosis not present

## 2019-12-14 DIAGNOSIS — E1043 Type 1 diabetes mellitus with diabetic autonomic (poly)neuropathy: Secondary | ICD-10-CM | POA: Diagnosis not present

## 2019-12-15 ENCOUNTER — Ambulatory Visit: Payer: Medicare HMO | Admitting: Physical Therapy

## 2019-12-17 IMAGING — DX DG CHEST 1V PORT
1 series · 1 of 1 positions shown · non-contrast
Comparison: 02/13/2019

CLINICAL DATA: Shortness of breath

EXAM:
PORTABLE CHEST 1 VIEW

[chest ap]
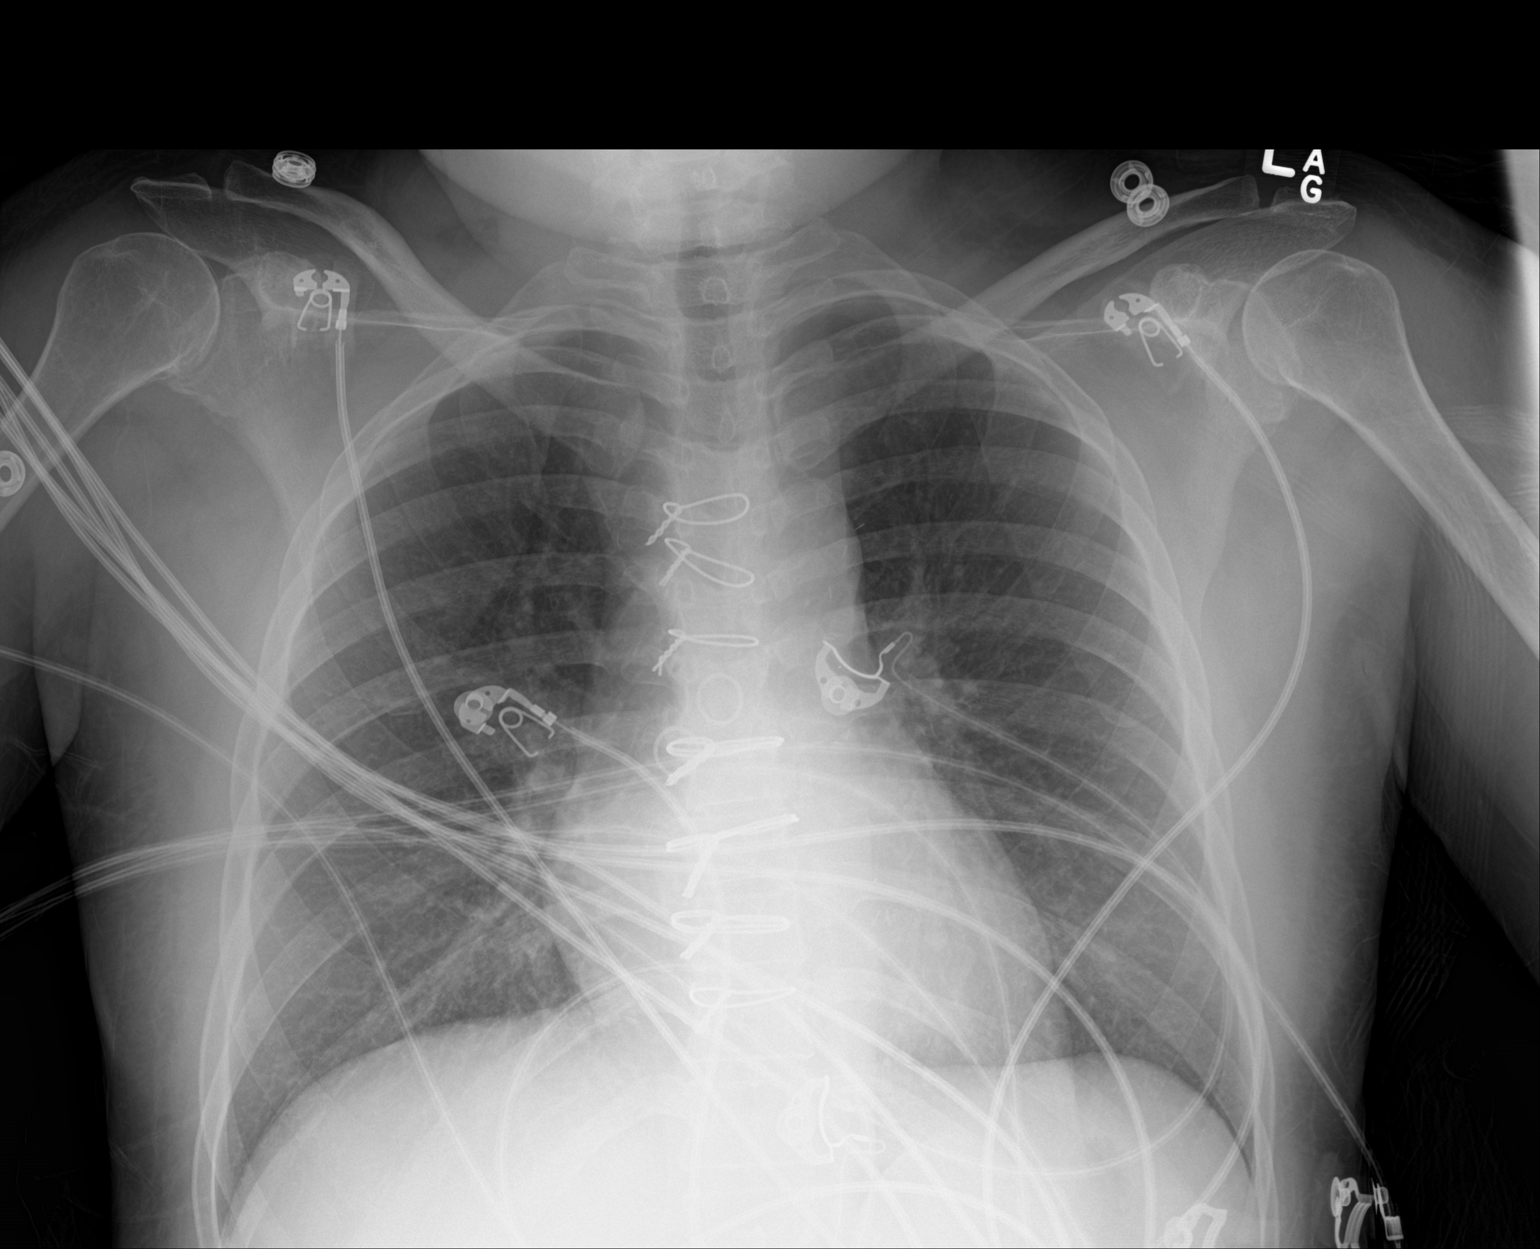

[1 of 1 positions shown; findings below may reference images not displayed]

FINDINGS: Prior CABG. Heart and mediastinal contours are within normal limits.
No focal opacities or effusions. No acute bony abnormality.
IMPRESSION: No active disease.

## 2019-12-18 DIAGNOSIS — E1065 Type 1 diabetes mellitus with hyperglycemia: Secondary | ICD-10-CM | POA: Diagnosis not present

## 2019-12-22 ENCOUNTER — Encounter: Payer: Self-pay | Admitting: Physical Therapy

## 2019-12-22 ENCOUNTER — Ambulatory Visit: Payer: Medicare HMO | Admitting: Physical Therapy

## 2019-12-22 ENCOUNTER — Other Ambulatory Visit: Payer: Self-pay

## 2019-12-22 DIAGNOSIS — R600 Localized edema: Secondary | ICD-10-CM | POA: Diagnosis not present

## 2019-12-22 DIAGNOSIS — M25551 Pain in right hip: Secondary | ICD-10-CM

## 2019-12-22 DIAGNOSIS — M6281 Muscle weakness (generalized): Secondary | ICD-10-CM | POA: Diagnosis not present

## 2019-12-22 NOTE — Therapy (Signed)
[place], [place], [address] Phone: [phone]   Fax:  [phone]  Physical Therapy Treatment  Patient Details  Name: [name] MRN: [id number] Date of Birth: 04-06-1966 Referring Provider (PT): [name]   Encounter Date: 12/22/2019   PT End of Session - 12/22/19 1142    Visit Number 6    Number of Visits 12    Date for PT Re-Evaluation 01/30/20    PT Start Time 1104    PT Stop Time 1145    PT Time Calculation (min) 41 min    Equipment Utilized During Treatment Gait belt    Activity Tolerance Patient tolerated treatment well;Patient limited by fatigue    Behavior During Therapy [facility] for tasks assessed/performed           Past Medical History:  Diagnosis Date  . AKI (acute kidney injury) ([place])   . Anemia, iron deficiency   . Anxiety and depression 05/18/2015  . CAD (coronary artery disease), native coronary artery 01/11/2018   s/p CABG - Non-STEMI with severe three-vessel disease noted July 2019  . Depression   . Diabetic gastroparesis ([place])   . Diabetic neuropathy, type I diabetes mellitus ([place]) 05/18/2015  . DKA, type 1 ([place]) 03/18/2016  . Essential hypertension   . Gastroparesis   . GERD (gastroesophageal reflux disease)   . HLD (hyperlipidemia)   . MDD (major depressive disorder), single episode, severe , no psychosis ([place])   . Tardive dyskinesia   . Vitamin B12 deficiency 08/16/2015    Past Surgical History:  Procedure Laterality Date  . CARDIAC SURGERY    . COLONOSCOPY  09/27/2014   at [facility]  . CORONARY ARTERY BYPASS GRAFT N/A 01/14/2018   Procedure: CORONARY ARTERY BYPASS GRAFTING (CABG)X3, RIGHT AND LEFT SAPHENOUS VEIN HARVEST, MAMMARY TAKE DOWN. MAMMARY TO LAD, SVG TO PD, SVG TO DISTAL CIRC.;  Surgeon: Grace Isaac, MD;  Location: [facility];  Service: Open Heart Surgery;  Laterality: N/A;  . ESOPHAGOGASTRODUODENOSCOPY  09/27/2014   at [facility], Dr Rolan Lipa.  biospy neg for celiac, neg for H pylori.   . EYE SURGERY    . gailstones    . INTRAMEDULLARY (IM) NAIL INTERTROCHANTERIC Right 05/10/2019   Procedure: INTRAMEDULLARY (IM) NAIL INTERTROCHANTRIC;  Surgeon: Lovell Sheehan, MD;  Location: [facility];  Service: Orthopedics;  Laterality: Right;  . IR FLUORO GUIDE CV LINE RIGHT  02/01/2017  . IR FLUORO GUIDE CV LINE RIGHT  03/06/2017  . IR FLUORO GUIDE CV LINE RIGHT  03/25/2017  . IR GENERIC HISTORICAL  01/24/2016   IR FLUORO GUIDE CV LINE RIGHT 01/24/2016 Darrell K Allred, PA-C [facility]  . IR GENERIC HISTORICAL  01/24/2016   IR US GUIDE VASC ACCESS RIGHT 01/24/2016 Darrell K Allred, PA-C [facility]  . IR US GUIDE VASC ACCESS RIGHT  02/01/2017  . IR US GUIDE VASC ACCESS RIGHT  03/06/2017  . IR US GUIDE VASC ACCESS RIGHT  03/25/2017  . IR US GUIDE VASC ACCESS RIGHT  01/20/2019  . IR VENIPUNCTURE 74YRS/OLDER BY MD  01/20/2019  . LEFT HEART CATH AND CORONARY ANGIOGRAPHY N/A 01/07/2018   Procedure: LEFT HEART CATH AND CORONARY ANGIOGRAPHY;  Surgeon: Troy Sine, MD;  Location: [facility];  Service: Cardiovascular;  Laterality: N/A;  . POSTERIOR VITRECTOMY AND MEMBRANE PEEL-LEFT EYE  09/28/2002  . POSTERIOR VITRECTOMY AND MEMBRANE PEEL-RIGHT EYE  03/16/2002  . RETINAL DETACHMENT SURGERY    . TEE WITHOUT  CARDIOVERSION N/A 01/14/2018   Procedure: TRANSESOPHAGEAL ECHOCARDIOGRAM (TEE);  Surgeon: Grace Isaac, MD;  Location: [facility];  Service: Open Heart Surgery;  Laterality: N/A;    There were no vitals filed for this visit.   Subjective Assessment - 12/22/19 1107    Subjective Pt reports that she is feeling a little better overall; still having some knee pain    Currently in Pain? Yes    Pain Score 4     Pain Location Leg    Pain Orientation Right                             [facility] Adult PT Treatment/Exercise - 12/22/19 0001      Ambulation/Gait   Gait Comments gait down hallway and back with no AD; CGA no LOB cues for  reciprocal arm swing      Knee/Hip Exercises: Aerobic   Nustep L4 x 7 min LE only      Knee/Hip Exercises: Machines for Strengthening   Cybex Knee Extension 5# 3x5    Cybex Knee Flexion 15# 2x10 BLE, 5# 1x10 RLE    Cybex Leg Press 20# 2x15 BLE      Knee/Hip Exercises: Standing   Other Standing Knee Exercises STS 1x10, STS with LLE raised 3x5                    PT Short Term Goals - 12/22/19 1143      PT SHORT TERM GOAL #1   Title Pt will be independent with HEP    Time 2    Period Weeks    Status Achieved    Target Date 11/18/19             PT Long Term Goals - 12/22/19 1145      PT LONG TERM GOAL #1   Title Pt will demonstrate ability to walk 1 lap around building with no AD    Baseline walks 1 lap down/back hallway without AD with CGA    Time 8    Period Weeks    Status On-going      PT LONG TERM GOAL #2   Title Pt will demonstrate TUG <12 sec with no AD    Baseline 19 sec with no AD; 15 sec with SPC    Time 8    Period Weeks    Status On-going      PT LONG TERM GOAL #3   Title Pt will demonstrate RLE MMT equivalent to LLE    Time 8    Period Weeks    Status On-going      PT LONG TERM GOAL #4   Title Pt will report reduction in RLE pain by 50%    Time 8    Period Weeks    Status Partially Met                 Plan - 12/22/19 1148    Clinical Impression Statement Pt demonstrates gait with no AD; CGA, no LOB gait belt more for patient comfort/confidence. Pt remains very fearful of falling. Pt required cues for even step length and reciprocal arm swing. Pt tolerated progression of TE; cues for TKE with knee ext machine exercise. Progress HEP next rx; pt would like more ex's d/t inability to come 2x/week.    PT Treatment/Interventions ADLs/Self Care Home Management;Electrical Stimulation;Moist Heat;Iontophoresis 75m/ml Dexamethasone;Cryotherapy;Gait training;Stair training;Functional mobility training;Therapeutic activities;Therapeutic  exercise;Balance training;Neuromuscular re-education;Manual techniques;Patient/family education;Passive  range of motion;Dry needling;Vasopneumatic Device    PT Next Visit Plan Progress LE strengthening/flexibility, work on stair progression, manual/modalities as indicated    Oncologist with Plan of Care Patient           Patient will benefit from skilled therapeutic intervention in order to improve the following deficits and impairments:  Abnormal gait, Decreased range of motion, Difficulty walking, Decreased endurance, Increased muscle spasms, Decreased activity tolerance, Pain, Decreased balance, Hypomobility, Impaired flexibility, Improper body mechanics, Decreased mobility, Decreased strength  Visit Diagnosis: Localized edema  Pain in right hip  Muscle weakness (generalized)     Problem List Patient Active Problem List   Diagnosis Date Noted  . Closed fracture of proximal end of femur, right, initial encounter ([place]) 05/09/2019  . CAD (coronary artery disease) 03/06/2019  . Acute encephalopathy 12/22/2018  . Diabetic ketoacidosis ([place]) 04/30/2018  . Gastroparesis 04/13/2018  . DKA (diabetic ketoacidosis) ([employer]) 03/31/2018  . S/P CABG x 3 01/14/2018  . CAD (coronary artery disease), native coronary artery 01/11/2018  . Dehydration   . MDD (major depressive disorder), single episode, severe , no psychosis ([place])   . DKA (diabetic ketoacidoses) ([place]) 06/16/2017  . Tardive dyskinesia   . Type 1 diabetes mellitus with hyperlipidemia ([place]) 03/18/2016  . Noncompliance with treatment plan 03/13/2016  . Essential hypertension 01/24/2016  . HLD (hyperlipidemia)   . GERD (gastroesophageal reflux disease)   . AKI (acute kidney injury) ([place])   . Anemia, iron deficiency   . Vitamin B12 deficiency 08/16/2015  . Diabetic gastroparesis ([place])   . Diabetic neuropathy, type I diabetes mellitus ([place]) 05/18/2015  . Anxiety and depression 05/18/2015   Amador Cunas, PT, DPT [facility]  [name] 12/22/2019, 11:54 AM  [address] [place], [place], [address] Phone: [phone]   Fax:  [phone]  Name: [name] MRN: [id number] Date of Birth: 02/02/1966

## 2019-12-24 DIAGNOSIS — E875 Hyperkalemia: Secondary | ICD-10-CM | POA: Diagnosis not present

## 2019-12-24 DIAGNOSIS — J9811 Atelectasis: Secondary | ICD-10-CM | POA: Diagnosis not present

## 2019-12-24 DIAGNOSIS — D72829 Elevated white blood cell count, unspecified: Secondary | ICD-10-CM | POA: Diagnosis not present

## 2019-12-24 DIAGNOSIS — N179 Acute kidney failure, unspecified: Secondary | ICD-10-CM | POA: Diagnosis not present

## 2019-12-24 DIAGNOSIS — N200 Calculus of kidney: Secondary | ICD-10-CM | POA: Diagnosis not present

## 2019-12-24 DIAGNOSIS — E871 Hypo-osmolality and hyponatremia: Secondary | ICD-10-CM | POA: Diagnosis not present

## 2019-12-24 DIAGNOSIS — R0689 Other abnormalities of breathing: Secondary | ICD-10-CM | POA: Diagnosis not present

## 2019-12-24 DIAGNOSIS — K3184 Gastroparesis: Secondary | ICD-10-CM | POA: Diagnosis not present

## 2019-12-24 DIAGNOSIS — R110 Nausea: Secondary | ICD-10-CM | POA: Diagnosis not present

## 2019-12-24 DIAGNOSIS — G9341 Metabolic encephalopathy: Secondary | ICD-10-CM | POA: Diagnosis not present

## 2019-12-24 DIAGNOSIS — R112 Nausea with vomiting, unspecified: Secondary | ICD-10-CM | POA: Diagnosis not present

## 2019-12-24 DIAGNOSIS — R4182 Altered mental status, unspecified: Secondary | ICD-10-CM | POA: Diagnosis not present

## 2019-12-24 DIAGNOSIS — Z20822 Contact with and (suspected) exposure to covid-19: Secondary | ICD-10-CM | POA: Diagnosis not present

## 2019-12-24 DIAGNOSIS — D259 Leiomyoma of uterus, unspecified: Secondary | ICD-10-CM | POA: Diagnosis not present

## 2019-12-24 DIAGNOSIS — E1143 Type 2 diabetes mellitus with diabetic autonomic (poly)neuropathy: Secondary | ICD-10-CM | POA: Diagnosis not present

## 2019-12-24 DIAGNOSIS — K2950 Unspecified chronic gastritis without bleeding: Secondary | ICD-10-CM | POA: Diagnosis not present

## 2019-12-24 DIAGNOSIS — E1110 Type 2 diabetes mellitus with ketoacidosis without coma: Secondary | ICD-10-CM | POA: Diagnosis not present

## 2019-12-24 DIAGNOSIS — J690 Pneumonitis due to inhalation of food and vomit: Secondary | ICD-10-CM | POA: Diagnosis not present

## 2019-12-24 DIAGNOSIS — I959 Hypotension, unspecified: Secondary | ICD-10-CM | POA: Diagnosis not present

## 2019-12-24 DIAGNOSIS — R1084 Generalized abdominal pain: Secondary | ICD-10-CM | POA: Diagnosis not present

## 2019-12-24 DIAGNOSIS — E1043 Type 1 diabetes mellitus with diabetic autonomic (poly)neuropathy: Secondary | ICD-10-CM | POA: Diagnosis not present

## 2019-12-24 DIAGNOSIS — K529 Noninfective gastroenteritis and colitis, unspecified: Secondary | ICD-10-CM | POA: Diagnosis not present

## 2019-12-24 DIAGNOSIS — J189 Pneumonia, unspecified organism: Secondary | ICD-10-CM | POA: Diagnosis not present

## 2019-12-24 DIAGNOSIS — K317 Polyp of stomach and duodenum: Secondary | ICD-10-CM | POA: Diagnosis not present

## 2019-12-24 DIAGNOSIS — L89622 Pressure ulcer of left heel, stage 2: Secondary | ICD-10-CM | POA: Diagnosis not present

## 2019-12-24 DIAGNOSIS — R6520 Severe sepsis without septic shock: Secondary | ICD-10-CM | POA: Diagnosis not present

## 2019-12-24 DIAGNOSIS — E278 Other specified disorders of adrenal gland: Secondary | ICD-10-CM | POA: Diagnosis not present

## 2019-12-24 DIAGNOSIS — A419 Sepsis, unspecified organism: Secondary | ICD-10-CM | POA: Diagnosis not present

## 2019-12-24 DIAGNOSIS — J90 Pleural effusion, not elsewhere classified: Secondary | ICD-10-CM | POA: Diagnosis not present

## 2019-12-24 DIAGNOSIS — K3189 Other diseases of stomach and duodenum: Secondary | ICD-10-CM | POA: Diagnosis not present

## 2019-12-24 DIAGNOSIS — Z8619 Personal history of other infectious and parasitic diseases: Secondary | ICD-10-CM | POA: Diagnosis not present

## 2019-12-24 DIAGNOSIS — E1165 Type 2 diabetes mellitus with hyperglycemia: Secondary | ICD-10-CM | POA: Diagnosis not present

## 2019-12-24 DIAGNOSIS — R000 Tachycardia, unspecified: Secondary | ICD-10-CM | POA: Diagnosis not present

## 2019-12-24 DIAGNOSIS — E1010 Type 1 diabetes mellitus with ketoacidosis without coma: Secondary | ICD-10-CM | POA: Diagnosis not present

## 2019-12-24 DIAGNOSIS — R52 Pain, unspecified: Secondary | ICD-10-CM | POA: Diagnosis not present

## 2019-12-24 DIAGNOSIS — E860 Dehydration: Secondary | ICD-10-CM | POA: Diagnosis not present

## 2019-12-24 DIAGNOSIS — R109 Unspecified abdominal pain: Secondary | ICD-10-CM | POA: Diagnosis not present

## 2019-12-25 ENCOUNTER — Ambulatory Visit: Payer: Medicare HMO | Admitting: Physical Therapy

## 2019-12-30 ENCOUNTER — Encounter: Payer: Medicare HMO | Admitting: Physical Therapy

## 2020-01-04 ENCOUNTER — Encounter: Payer: Medicare HMO | Admitting: Physical Therapy

## 2020-01-06 DIAGNOSIS — R0609 Other forms of dyspnea: Secondary | ICD-10-CM | POA: Diagnosis not present

## 2020-01-06 DIAGNOSIS — R000 Tachycardia, unspecified: Secondary | ICD-10-CM | POA: Diagnosis not present

## 2020-01-06 DIAGNOSIS — E1040 Type 1 diabetes mellitus with diabetic neuropathy, unspecified: Secondary | ICD-10-CM | POA: Diagnosis not present

## 2020-01-06 DIAGNOSIS — E1043 Type 1 diabetes mellitus with diabetic autonomic (poly)neuropathy: Secondary | ICD-10-CM | POA: Diagnosis not present

## 2020-01-06 DIAGNOSIS — I2510 Atherosclerotic heart disease of native coronary artery without angina pectoris: Secondary | ICD-10-CM | POA: Diagnosis not present

## 2020-01-06 DIAGNOSIS — E860 Dehydration: Secondary | ICD-10-CM | POA: Diagnosis not present

## 2020-01-06 DIAGNOSIS — R195 Other fecal abnormalities: Secondary | ICD-10-CM | POA: Diagnosis not present

## 2020-01-06 DIAGNOSIS — E10649 Type 1 diabetes mellitus with hypoglycemia without coma: Secondary | ICD-10-CM | POA: Diagnosis not present

## 2020-01-06 DIAGNOSIS — R9431 Abnormal electrocardiogram [ECG] [EKG]: Secondary | ICD-10-CM | POA: Diagnosis not present

## 2020-01-06 DIAGNOSIS — I517 Cardiomegaly: Secondary | ICD-10-CM | POA: Diagnosis not present

## 2020-01-06 DIAGNOSIS — R6520 Severe sepsis without septic shock: Secondary | ICD-10-CM | POA: Diagnosis not present

## 2020-01-06 DIAGNOSIS — E872 Acidosis: Secondary | ICD-10-CM | POA: Diagnosis not present

## 2020-01-06 DIAGNOSIS — Z9114 Patient's other noncompliance with medication regimen: Secondary | ICD-10-CM | POA: Diagnosis not present

## 2020-01-06 DIAGNOSIS — E875 Hyperkalemia: Secondary | ICD-10-CM | POA: Diagnosis not present

## 2020-01-06 DIAGNOSIS — Z951 Presence of aortocoronary bypass graft: Secondary | ICD-10-CM | POA: Diagnosis not present

## 2020-01-06 DIAGNOSIS — R778 Other specified abnormalities of plasma proteins: Secondary | ICD-10-CM | POA: Diagnosis not present

## 2020-01-06 DIAGNOSIS — R0602 Shortness of breath: Secondary | ICD-10-CM | POA: Diagnosis not present

## 2020-01-06 DIAGNOSIS — R0689 Other abnormalities of breathing: Secondary | ICD-10-CM | POA: Diagnosis not present

## 2020-01-06 DIAGNOSIS — R0600 Dyspnea, unspecified: Secondary | ICD-10-CM | POA: Diagnosis not present

## 2020-01-06 DIAGNOSIS — E109 Type 1 diabetes mellitus without complications: Secondary | ICD-10-CM | POA: Diagnosis not present

## 2020-01-06 DIAGNOSIS — E1065 Type 1 diabetes mellitus with hyperglycemia: Secondary | ICD-10-CM | POA: Diagnosis not present

## 2020-01-06 DIAGNOSIS — J189 Pneumonia, unspecified organism: Secondary | ICD-10-CM | POA: Diagnosis not present

## 2020-01-06 DIAGNOSIS — Z794 Long term (current) use of insulin: Secondary | ICD-10-CM | POA: Diagnosis not present

## 2020-01-06 DIAGNOSIS — M25551 Pain in right hip: Secondary | ICD-10-CM | POA: Diagnosis not present

## 2020-01-06 DIAGNOSIS — Z452 Encounter for adjustment and management of vascular access device: Secondary | ICD-10-CM | POA: Diagnosis not present

## 2020-01-06 DIAGNOSIS — G9341 Metabolic encephalopathy: Secondary | ICD-10-CM | POA: Diagnosis not present

## 2020-01-06 DIAGNOSIS — D72829 Elevated white blood cell count, unspecified: Secondary | ICD-10-CM | POA: Diagnosis not present

## 2020-01-06 DIAGNOSIS — N200 Calculus of kidney: Secondary | ICD-10-CM | POA: Diagnosis not present

## 2020-01-06 DIAGNOSIS — E11649 Type 2 diabetes mellitus with hypoglycemia without coma: Secondary | ICD-10-CM | POA: Diagnosis not present

## 2020-01-06 DIAGNOSIS — E1110 Type 2 diabetes mellitus with ketoacidosis without coma: Secondary | ICD-10-CM | POA: Diagnosis not present

## 2020-01-06 DIAGNOSIS — R6521 Severe sepsis with septic shock: Secondary | ICD-10-CM | POA: Diagnosis not present

## 2020-01-06 DIAGNOSIS — J939 Pneumothorax, unspecified: Secondary | ICD-10-CM | POA: Diagnosis not present

## 2020-01-06 DIAGNOSIS — G92 Toxic encephalopathy: Secondary | ICD-10-CM | POA: Diagnosis not present

## 2020-01-06 DIAGNOSIS — E1165 Type 2 diabetes mellitus with hyperglycemia: Secondary | ICD-10-CM | POA: Diagnosis not present

## 2020-01-06 DIAGNOSIS — E1010 Type 1 diabetes mellitus with ketoacidosis without coma: Secondary | ICD-10-CM | POA: Diagnosis not present

## 2020-01-06 DIAGNOSIS — N179 Acute kidney failure, unspecified: Secondary | ICD-10-CM | POA: Diagnosis not present

## 2020-01-06 DIAGNOSIS — I10 Essential (primary) hypertension: Secondary | ICD-10-CM | POA: Diagnosis not present

## 2020-01-06 DIAGNOSIS — I248 Other forms of acute ischemic heart disease: Secondary | ICD-10-CM | POA: Diagnosis not present

## 2020-01-06 DIAGNOSIS — A419 Sepsis, unspecified organism: Secondary | ICD-10-CM | POA: Diagnosis not present

## 2020-01-06 DIAGNOSIS — R918 Other nonspecific abnormal finding of lung field: Secondary | ICD-10-CM | POA: Diagnosis not present

## 2020-01-06 DIAGNOSIS — N3289 Other specified disorders of bladder: Secondary | ICD-10-CM | POA: Diagnosis not present

## 2020-01-08 ENCOUNTER — Encounter: Payer: Medicare HMO | Admitting: Physical Therapy

## 2020-01-13 DIAGNOSIS — Z8639 Personal history of other endocrine, nutritional and metabolic disease: Secondary | ICD-10-CM | POA: Diagnosis not present

## 2020-01-13 DIAGNOSIS — R1084 Generalized abdominal pain: Secondary | ICD-10-CM | POA: Diagnosis not present

## 2020-01-13 DIAGNOSIS — E1165 Type 2 diabetes mellitus with hyperglycemia: Secondary | ICD-10-CM | POA: Diagnosis not present

## 2020-01-13 DIAGNOSIS — E871 Hypo-osmolality and hyponatremia: Secondary | ICD-10-CM | POA: Diagnosis not present

## 2020-01-13 DIAGNOSIS — Z8679 Personal history of other diseases of the circulatory system: Secondary | ICD-10-CM | POA: Diagnosis not present

## 2020-01-13 DIAGNOSIS — R9431 Abnormal electrocardiogram [ECG] [EKG]: Secondary | ICD-10-CM | POA: Diagnosis not present

## 2020-01-13 DIAGNOSIS — R52 Pain, unspecified: Secondary | ICD-10-CM | POA: Diagnosis not present

## 2020-01-13 DIAGNOSIS — K3184 Gastroparesis: Secondary | ICD-10-CM | POA: Diagnosis not present

## 2020-01-13 DIAGNOSIS — R5383 Other fatigue: Secondary | ICD-10-CM | POA: Diagnosis not present

## 2020-01-13 DIAGNOSIS — I10 Essential (primary) hypertension: Secondary | ICD-10-CM | POA: Diagnosis not present

## 2020-01-13 DIAGNOSIS — R112 Nausea with vomiting, unspecified: Secondary | ICD-10-CM | POA: Diagnosis not present

## 2020-01-14 DIAGNOSIS — R9431 Abnormal electrocardiogram [ECG] [EKG]: Secondary | ICD-10-CM | POA: Diagnosis not present

## 2020-01-24 ENCOUNTER — Other Ambulatory Visit: Payer: Self-pay | Admitting: Endocrinology

## 2020-01-24 DIAGNOSIS — E785 Hyperlipidemia, unspecified: Secondary | ICD-10-CM

## 2020-01-24 DIAGNOSIS — E1069 Type 1 diabetes mellitus with other specified complication: Secondary | ICD-10-CM

## 2020-01-25 ENCOUNTER — Telehealth: Payer: Self-pay

## 2020-01-25 NOTE — Telephone Encounter (Signed)
ATC patient to get her on the [place] answer and no VM set up

## 2020-01-29 DIAGNOSIS — D72829 Elevated white blood cell count, unspecified: Secondary | ICD-10-CM | POA: Diagnosis not present

## 2020-01-29 DIAGNOSIS — I10 Essential (primary) hypertension: Secondary | ICD-10-CM | POA: Diagnosis not present

## 2020-01-29 DIAGNOSIS — R112 Nausea with vomiting, unspecified: Secondary | ICD-10-CM | POA: Diagnosis not present

## 2020-01-29 DIAGNOSIS — M25572 Pain in left ankle and joints of left foot: Secondary | ICD-10-CM | POA: Diagnosis not present

## 2020-01-29 DIAGNOSIS — E1165 Type 2 diabetes mellitus with hyperglycemia: Secondary | ICD-10-CM | POA: Diagnosis not present

## 2020-01-29 DIAGNOSIS — N179 Acute kidney failure, unspecified: Secondary | ICD-10-CM | POA: Diagnosis not present

## 2020-01-29 DIAGNOSIS — I2510 Atherosclerotic heart disease of native coronary artery without angina pectoris: Secondary | ICD-10-CM | POA: Diagnosis not present

## 2020-01-29 DIAGNOSIS — L89626 Pressure-induced deep tissue damage of left heel: Secondary | ICD-10-CM | POA: Diagnosis not present

## 2020-01-29 DIAGNOSIS — R52 Pain, unspecified: Secondary | ICD-10-CM | POA: Diagnosis not present

## 2020-01-29 DIAGNOSIS — I959 Hypotension, unspecified: Secondary | ICD-10-CM | POA: Diagnosis not present

## 2020-01-29 DIAGNOSIS — G9341 Metabolic encephalopathy: Secondary | ICD-10-CM | POA: Diagnosis not present

## 2020-01-29 DIAGNOSIS — Z0389 Encounter for observation for other suspected diseases and conditions ruled out: Secondary | ICD-10-CM | POA: Diagnosis not present

## 2020-01-29 DIAGNOSIS — R6510 Systemic inflammatory response syndrome (SIRS) of non-infectious origin without acute organ dysfunction: Secondary | ICD-10-CM | POA: Diagnosis not present

## 2020-01-29 DIAGNOSIS — I4510 Unspecified right bundle-branch block: Secondary | ICD-10-CM | POA: Diagnosis not present

## 2020-01-29 DIAGNOSIS — F329 Major depressive disorder, single episode, unspecified: Secondary | ICD-10-CM | POA: Diagnosis not present

## 2020-01-29 DIAGNOSIS — R000 Tachycardia, unspecified: Secondary | ICD-10-CM | POA: Diagnosis not present

## 2020-01-29 DIAGNOSIS — E1010 Type 1 diabetes mellitus with ketoacidosis without coma: Secondary | ICD-10-CM | POA: Diagnosis not present

## 2020-01-29 DIAGNOSIS — E1043 Type 1 diabetes mellitus with diabetic autonomic (poly)neuropathy: Secondary | ICD-10-CM | POA: Diagnosis not present

## 2020-01-29 DIAGNOSIS — R9431 Abnormal electrocardiogram [ECG] [EKG]: Secondary | ICD-10-CM | POA: Diagnosis not present

## 2020-01-29 DIAGNOSIS — R0682 Tachypnea, not elsewhere classified: Secondary | ICD-10-CM | POA: Diagnosis not present

## 2020-01-29 DIAGNOSIS — Z794 Long term (current) use of insulin: Secondary | ICD-10-CM | POA: Diagnosis not present

## 2020-01-29 DIAGNOSIS — R1084 Generalized abdominal pain: Secondary | ICD-10-CM | POA: Diagnosis not present

## 2020-01-29 DIAGNOSIS — E1065 Type 1 diabetes mellitus with hyperglycemia: Secondary | ICD-10-CM | POA: Diagnosis not present

## 2020-01-29 DIAGNOSIS — M25551 Pain in right hip: Secondary | ICD-10-CM | POA: Diagnosis not present

## 2020-01-29 DIAGNOSIS — I119 Hypertensive heart disease without heart failure: Secondary | ICD-10-CM | POA: Diagnosis not present

## 2020-02-05 ENCOUNTER — Other Ambulatory Visit: Payer: Self-pay

## 2020-02-05 NOTE — Patient Outreach (Signed)
[place] [facility]) Care Management  02/05/2020  [name] 1966-06-20 [id number]   Referral Date: 02/05/20 Referral Source: [employer] Report Date of Discharge:02/03/20 Facility: [place]: [facility]  Outreach attempt: No answer.  States mail box full.  Plan: RN CM will attempt again within 4 business days and send letter.    [name], RN, MSN [facility] Care Management Care Management Coordinator Direct Line [phone] Toll Free: [phone]  Fax: [phone]

## 2020-02-08 ENCOUNTER — Other Ambulatory Visit: Payer: Self-pay

## 2020-02-08 NOTE — Patient Outreach (Signed)
[place] [facility]) Care Management  02/08/2020  Reni Hausner Jun 01, 1966 [id number]   Referral Date: 02/05/20 Referral Source: [employer] Report Date of Discharge:02/03/20 Facility: [place]: [facility]  Outreach attempt: No answer.  HIPAA compliant voice message left. Plan: RN CM will attempt again within 4 business days.  [name], RN, MSN [place] Management Care Management Coordinator Direct Line [phone] Cell [phone] Toll Free: [phone]  Fax: [phone]

## 2020-02-09 ENCOUNTER — Other Ambulatory Visit: Payer: Self-pay

## 2020-02-09 NOTE — Patient Outreach (Signed)
[place] [facility]) Care Management  02/09/2020  Kiesha Ensey 1965-09-14 [id number]   Referral Date:02/05/20 Referral Source:[employer] Report Date of Discharge:02/03/20 Facility:[facility] Insurance:[employer]  Outreach attempt:No answer. HIPAA compliant voice message left. Plan: If no return call, RN CM will make 4th attempt.  [name], RN, MSN [place] Management Care Management Coordinator Direct Line [phone] Cell [phone] Toll Free: [phone]  Fax: [phone]

## 2020-02-10 DIAGNOSIS — I509 Heart failure, unspecified: Secondary | ICD-10-CM | POA: Diagnosis not present

## 2020-02-10 DIAGNOSIS — I2510 Atherosclerotic heart disease of native coronary artery without angina pectoris: Secondary | ICD-10-CM | POA: Diagnosis not present

## 2020-02-10 DIAGNOSIS — I110 Hypertensive heart disease with heart failure: Secondary | ICD-10-CM | POA: Diagnosis not present

## 2020-02-10 DIAGNOSIS — E1043 Type 1 diabetes mellitus with diabetic autonomic (poly)neuropathy: Secondary | ICD-10-CM | POA: Diagnosis not present

## 2020-02-10 DIAGNOSIS — E1042 Type 1 diabetes mellitus with diabetic polyneuropathy: Secondary | ICD-10-CM | POA: Diagnosis not present

## 2020-02-10 DIAGNOSIS — E1021 Type 1 diabetes mellitus with diabetic nephropathy: Secondary | ICD-10-CM | POA: Diagnosis not present

## 2020-02-10 DIAGNOSIS — K3184 Gastroparesis: Secondary | ICD-10-CM | POA: Diagnosis not present

## 2020-02-10 DIAGNOSIS — J45909 Unspecified asthma, uncomplicated: Secondary | ICD-10-CM | POA: Diagnosis not present

## 2020-02-10 DIAGNOSIS — E1065 Type 1 diabetes mellitus with hyperglycemia: Secondary | ICD-10-CM | POA: Diagnosis not present

## 2020-02-11 DIAGNOSIS — E119 Type 2 diabetes mellitus without complications: Secondary | ICD-10-CM | POA: Diagnosis not present

## 2020-02-11 DIAGNOSIS — E11621 Type 2 diabetes mellitus with foot ulcer: Secondary | ICD-10-CM | POA: Diagnosis not present

## 2020-02-11 DIAGNOSIS — L89892 Pressure ulcer of other site, stage 2: Secondary | ICD-10-CM | POA: Diagnosis not present

## 2020-02-11 DIAGNOSIS — L03116 Cellulitis of left lower limb: Secondary | ICD-10-CM | POA: Diagnosis not present

## 2020-02-15 ENCOUNTER — Encounter: Payer: Self-pay | Admitting: Family

## 2020-02-15 ENCOUNTER — Other Ambulatory Visit: Payer: Self-pay

## 2020-02-15 ENCOUNTER — Telehealth: Payer: Self-pay | Admitting: Family Medicine

## 2020-02-15 ENCOUNTER — Ambulatory Visit: Payer: Medicare HMO | Attending: Family | Admitting: Family

## 2020-02-15 VITALS — BP 133/72 | HR 69 | Temp 98.0°F | Resp 16 | Wt 159.4 lb

## 2020-02-15 DIAGNOSIS — K3184 Gastroparesis: Secondary | ICD-10-CM

## 2020-02-15 DIAGNOSIS — E1065 Type 1 diabetes mellitus with hyperglycemia: Secondary | ICD-10-CM

## 2020-02-15 DIAGNOSIS — Z09 Encounter for follow-up examination after completed treatment for conditions other than malignant neoplasm: Secondary | ICD-10-CM

## 2020-02-15 LAB — POCT GLYCOSYLATED HEMOGLOBIN (HGB A1C): HbA1c, POC (controlled diabetic range): 8.1 % — AB (ref 0.0–7.0)

## 2020-02-15 LAB — GLUCOSE, POCT (MANUAL RESULT ENTRY): POC Glucose: 173 mg/dl — AB (ref 70–99)

## 2020-02-15 NOTE — Telephone Encounter (Signed)
Copied from [place] [id number]. Topic: General - Other >> Feb 15, 2020  2:19 PM Keene Breath wrote: Reason for CRM: Patient called to find out why her medication is not at the pharmacy.  Patient would like to speak with the doctor or nurse as soon as possible.  CB# [phone] >> Feb 15, 2020  4:51 PM Percell Belt A wrote: Pt called this morning and stated that this med was not called in and she stated she is going to keep calling until it is called in. She stated she gets sick if she does not have this med  Metoclopramide   Best number [phone]

## 2020-02-15 NOTE — Patient Instructions (Addendum)
Continue Reglan as prescribed. Keep appointments with Endocrinology. Follow-up with primary physician as scheduled or sooner if needed.  Gastroparesis  Gastroparesis is a condition in which food takes longer than normal to empty from the stomach. The condition is usually long-lasting (chronic). It may also be called delayed gastric emptying. There is no cure, but there are treatments and things that you can do at home to help relieve symptoms. Treating the underlying condition that causes gastroparesis can also help relieve symptoms. What are the causes? In many cases, the cause of this condition is not known. Possible causes include:  A hormone (endocrine) disorder, such as hypothyroidism or diabetes.  A nervous system disease, such as Parkinson's disease or multiple sclerosis.  Cancer, infection, or surgery that affects the stomach or vagus nerve. The vagus nerve runs from your chest, through your neck, to the lower part of your brain.  A connective tissue disorder, such as scleroderma.  Certain medicines. What increases the risk? You are more likely to develop this condition if you:  Have certain disorders or diseases, including: ? An endocrine disorder. ? An eating disorder. ? Amyloidosis. ? Scleroderma. ? Parkinson's disease. ? Multiple sclerosis. ? Cancer or infection of the stomach or the vagus nerve.  Have had surgery on the stomach or vagus nerve.  Take certain medicines.  Are female. What are the signs or symptoms? Symptoms of this condition include:  Feeling full after eating very little.  Nausea.  Vomiting.  Heartburn.  Abdominal bloating.  Inconsistent blood sugar (glucose) levels on blood tests.  Lack of appetite.  Weight loss.  Acid from the stomach coming up into the esophagus (gastroesophageal reflux).  Sudden tightening (spasm) of the stomach, which can be painful. Symptoms may come and go. Some people may not notice any symptoms. How is this  diagnosed? This condition is diagnosed with tests, such as:  Tests that check how long it takes food to move through the stomach and intestines. These tests include: ? Upper gastrointestinal (GI) series. For this test, you drink a liquid that shows up well on X-rays, and then X-rays will be taken of your intestines. ? Gastric emptying scintigraphy. For this test, you eat food that contains a small amount of radioactive material, and then scans are taken. ? Wireless capsule GI monitoring system. For this test, you swallow a pill (capsule) that records information about how foods and fluid move through your stomach.  Gastric manometry. For this test, a tube is passed down your throat and into your stomach to measure electrical and muscular activity.  Endoscopy. For this test, a long, thin tube is passed down your throat and into your stomach to check for problems in your stomach lining.  Ultrasound. This test uses sound waves to create images of inside the body. This can help rule out gallbladder disease or pancreatitis as a cause of your symptoms. How is this treated? There is no cure for gastroparesis. Treatment may include:  Treating the underlying cause.  Managing your symptoms by making changes to your diet and exercise habits.  Taking medicines to control nausea and vomiting and to stimulate stomach muscles.  Getting food through a feeding tube in the hospital. This may be done in severe cases.  Having surgery to insert a device into your body that helps improve stomach emptying and control nausea and vomiting (gastric neurostimulator). Follow these instructions at home:  Take over-the-counter and prescription medicines only as told by your health care provider.  Follow instructions from  your health care provider about eating or drinking restrictions. Your health care provider may recommend that you: ? Eat smaller meals more often. ? Eat low-fat foods. ? Eat low-fiber forms of  high-fiber foods. For example, eat cooked vegetables instead of raw vegetables. ? Have only liquid foods instead of solid foods. Liquid foods are easier to digest.  Drink enough fluid to keep your urine pale yellow.  Exercise as often as told by your health care provider.  Keep all follow-up visits as told by your health care provider. This is important. Contact a health care provider if you:  Notice that your symptoms do not improve with treatment.  Have new symptoms. Get help right away if you:  Have severe abdominal pain that does not improve with treatment.  Have nausea that is severe or does not go away.  Cannot drink fluids without vomiting. Summary  Gastroparesis is a chronic condition in which food takes longer than normal to empty from the stomach.  Symptoms include nausea, vomiting, heartburn, abdominal bloating, and loss of appetite.  Eating smaller portions, and low-fat, low-fiber foods may help you manage your symptoms.  Get help right away if you have severe abdominal pain. This information is not intended to replace advice given to you by your health care provider. Make sure you discuss any questions you have with your health care provider. Document Revised: 09/09/2017 Document Reviewed: 04/16/2017 Elsevier Patient Education  2020 [employer].

## 2020-02-15 NOTE — Progress Notes (Signed)
Patient ID: Kaula Klenke, female    DOB: 08-05-1965  MRN: [id number]  CC: Hospital Follow-Up  Subjective: [name] is a 54 y.o. female with history of essential hypertension, coronary artery disease native coronary artery, diabetic gastroparesis, gastroesophageal reflux disease, type 1 diabetes mellitus with hyperlipidemia, diabetic ketoacidoses, tardive dyskinesia, acute kidney injury, hyperlipidemia, anxiety and depression, vitamin B12 deficiency, and iron deficiency anemia who presents for hospital follow-up.  1. ER FOLLOW UP: Last visit 11/03/2019 with Dr. Margarita Rana. During that encounter hemoglobin A1C controlled. Continued on current management per Endocrinology. Gastroparesis stable. Continued on Ondansetron.  Patient admitted on 01/29/2020 to the [facility] with Dr. Lynnette Caffey. Patient discharged 02/03/2020 from the same facility by Dr.Wadhwani.  Admitted in critical condition and discharged in stable condition. Patient presented to the hospital on 01/29/2020 (on prior insulin pump) with DKA associated with nausea and vomiting and AKI. At that time patient reported 2 days of abdominal pain with nausea and vomiting and hyperglycemia at home. Upon chart review revealed multiple admissions in the last few months with the same presentation.  At the ED she was hypertensive in the 150s/130s, tachycardic, and tachypnea. O2 sat and temperature were normal. Labs were relevant for hyperphosphatemia at 8.0, lactate at 2.87, BG with metabolic acidosis pH 7.1, PCO2 19.4, PO2 109.8, Na 134, K 5.8, Cl 93, glucose 815, BUN 37/Scr 2.4, AG 32, HCO3 6.5, lipase 10. WNL LFTs. Leukocytosis at 17.3, Hgb 9.4, MCV 89, PLT 456, lymphocytosis at 15.2, and monocyte at 1.2 U/A with no insignificant signs, glucose > 500, ketones at 20, proteinuria and small hematuria. CXR with no acute cardiopulmonary changes. She was admitted to ICU for DKA and AKI.  In ICU she was initiated on DKA protocol and AG  resolved with transition to sq insulin and PO intake. Creatinine slowly improved with IVF. This also helped an episode of hypotension. She was deemed HD stable and transfered to the floor. Patient had a femoral CVC placed the day of admission by ED after numerous attempts by nursing staff for peripheral IV placement was unsuccessful.  Upon admission to the floor patient blood glucose remained stable but she continued to have nausea and vomiting. She had an EGD on 12/31/2019 by Dr. Marin Comment. No anatomical abnormality to explain for persistent nausea and vomiting - suspect gastroparesis. CT abdomen and pelvis on 01/06/2020. She was started on Reglan which her nausea and vomiting improved and patient was able to tolerate diet but she continued to have decreased appetite, anhedonia apathy. Psych was consulted to evaluate for major depression who recommended for increasing dose of BuSpar to 15 mg twice daily. Patient declines further outpatient resources or further evaluation for depression.  Her blood glucose remained stable, her kidney function improved and her nausea also improved and she was hemodynamically stable at that point PT was requested to evaluate patient, PT recommended for skilled nursing facility but patient refused and requested to go home along with order for physical therapy as an outpatient in [place]. Per patient's request all these things were arranged and patient was discharged home in hemodynamically stable condition.  She also had deep tissue injury of the left heel worsening of stage II pressure ulcers. She was told to ambulate as much as she can. Patient agreed and showed understanding.  02/15/2020: Time since discharge: 17 days Hospital/facility: [facility] Diagnosis: diabetic ketoacidosis without coma associated with type 1 diabetes mellitus, DKA type 1 not at goal, systemic inflammatory response syndrome,  impaired functional mobility, balance, gait, and  endurance, decreased activities of daily living, gastroparesis, acute kidney injury, neuropathy, generalized anxiety disorder Procedures/tests: chest x-ray, EKG, Consultants: nutrition, wound/ostomy eval and treatment, psychiatry  New medications: Ondansetron, Buspar 15 mg twice daily for 30 days, Lants 20 units daily for 30 days, Lyrica 75 mg twice daily for 30 days Continue on these medications: 8 - 10 units daily with dinner, Tylenol, Aspirin, Atorvastatin, Norco, Losartan, Reglan, Metoprolol, Protonix Discharge instructions: ambulatory referral to adult physical therapy, referral to PT, diabetic diet, activity as tolerated, f/u with PCP in 2 weeks, monitor blood glucose level three times in day and maintain a log and take with you to appointment, f/u with outpatient PT for improvement of strength and mobility, monitor blood pressure, change position every 2 hours, apply mepilex at your heels, take Reglan before meals, f/u with Dr. Dwyane Dee in 1 week (internal medicine) Status: better  Today patient reports she is feeling better since being discharged from hospital. Reports doing well on Reglan and requests refill. Denies nausea and vomiting since beginning Reglan. Reports she does take Zofran sometimes as needed for nausea and the last time being around 2 weeks ago. Reports she is using Omnipod insulin pump, continuous infusion, as prescribed by Endocrinology. Reports she changes the site every 3 days. Reports doing carb counting and self administers additional insulin as needed via continuous infusion. Reports she is still seeing endocrinologist Dr. Dwyane Dee and the next appointment is scheduled 03/01/2020. Reports she did not use Lantus as prescribed at discharge from hospital stating she is using continuous insulin pump. Reports she did eat breakfast this morning prior to appointment which consisted of eggs, sausage, and juice. Reports PT came to her house last week and completed paperwork and that she has  not heard from them since. Reports she saw Podiatrist last week and was given 10 days antibiotics and cream twice daily and scheduled to follow-up in 3 weeks.  Patient Active Problem List   Diagnosis Date Noted  . Closed fracture of proximal end of femur, right, initial encounter ([place]) 05/09/2019  . CAD (coronary artery disease) 03/06/2019  . Acute encephalopathy 12/22/2018  . Diabetic ketoacidosis ([place]) 04/30/2018  . Gastroparesis 04/13/2018  . DKA (diabetic ketoacidosis) ([place]) 03/31/2018  . S/P CABG x 3 01/14/2018  . CAD (coronary artery disease), native coronary artery 01/11/2018  . Dehydration   . MDD (major depressive disorder), single episode, severe , no psychosis ([place])   . DKA (diabetic ketoacidoses) ([place]) 06/16/2017  . Tardive dyskinesia   . Type 1 diabetes mellitus with hyperlipidemia ([place]) 03/18/2016  . Noncompliance with treatment plan 03/13/2016  . Essential hypertension 01/24/2016  . HLD (hyperlipidemia)   . GERD (gastroesophageal reflux disease)   . AKI (acute kidney injury) ([place])   . Anemia, iron deficiency   . Vitamin B12 deficiency 08/16/2015  . Diabetic gastroparesis ([place])   . Diabetic neuropathy, type I diabetes mellitus ([place]) 05/18/2015  . Anxiety and depression 05/18/2015     Current Outpatient Medications on File Prior to Visit  Medication Sig Dispense Refill  . ammonium lactate (AMLACTIN) 12 % cream Apply topically as needed for dry skin. 385 g 0  . aspirin EC 81 MG tablet Take 1 tablet (81 mg total) by mouth daily.    Marland Kitchen atorvastatin (LIPITOR) 40 MG tablet Take 1 tablet (40 mg total) by mouth daily. 90 tablet 3  . busPIRone (BUSPAR) 10 MG tablet Take 1 tablet (10 mg total) by mouth 3 (three) times daily.  90 tablet 3  . docusate sodium (COLACE) 100 MG capsule Take 1 capsule (100 mg total) by mouth daily as needed for mild constipation. 10 capsule 0  . erythromycin (ERYTHROCIN) 250 MG tablet Take 1 tablet with water 30 minutes before each meal 45 tablet 1  .  furosemide (LASIX) 40 MG tablet Take 1 tablet (40 mg total) by mouth every other day. 45 tablet 3  . glucose blood (CONTOUR TEST) test strip 1 each by Other route 4 (four) times daily. E11.9 120 each 2  . HYDROcodone-acetaminophen (NORCO/VICODIN) 5-325 MG tablet Take 1 tablet by mouth as needed.     . insulin aspart (NOVOLOG) 100 UNIT/ML injection INJECT 50 UNITS INTO THE SKIN DAILY. USE IN INSULIN PUMP 20 mL 0  . Insulin Disposable Pump (OMNIPOD DASH SYSTEM) KIT by Does not apply route.    . Insulin Syringe-Needle U-100 (BD INSULIN SYRINGE ULTRAFINE) 31G X 5/16" 0.5 ML MISC 1 each by Does not apply route 4 (four) times daily. 120 each 5  . loperamide (IMODIUM) 2 MG capsule Take 1 capsule (2 mg total) by mouth 3 (three) times daily as needed for diarrhea or loose stools. 30 capsule 0  . losartan (COZAAR) 25 MG tablet Take 1 tablet (25 mg total) by mouth daily. 90 tablet 3  . metoprolol tartrate (LOPRESSOR) 25 MG tablet Take 0.5 tablets (12.5 mg total) by mouth 2 (two) times daily. 90 tablet 2  . nitrofurantoin, macrocrystal-monohydrate, (MACROBID) 100 MG capsule Take 1 capsule (100 mg total) by mouth 2 (two) times daily. 20 capsule 0  . ondansetron (ZOFRAN) 4 MG tablet Take 1 tablet (4 mg total) by mouth every 8 (eight) hours as needed for nausea or vomiting. 40 tablet 0  . pantoprazole (PROTONIX) 40 MG tablet Take 1 tablet (40 mg total) by mouth daily. 30 tablet 3  . polyethylene glycol (MIRALAX / GLYCOLAX) 17 g packet Take 17 g by mouth 2 (two) times daily as needed for moderate constipation. 14 each 0  . pregabalin (LYRICA) 150 MG capsule Take 1 capsule (150 mg total) by mouth 2 (two) times daily. 180 capsule 1  . pyridostigmine (MESTINON) 60 MG tablet Take 60 mg by mouth 3 (three) times daily.     No current facility-administered medications on file prior to visit.    Allergies  Allergen Reactions  . Metoclopramide Other (See Comments)    Dystonia, muscle rigidity.  Patient states she is  only allergic to the "pill form, not the IV" Slurred speech, tongue swelling  . Anesthetics, Amide Nausea And Vomiting  . Chlorhexidine   . Penicillins Diarrhea, Nausea And Vomiting and Other (See Comments)    Has patient had a PCN reaction causing immediate rash, facial/tongue/throat swelling, SOB or lightheadedness with hypotension: Yes Has patient had a PCN reaction causing severe rash involving mucus membranes or skin necrosis: No Has patient had a PCN reaction that required hospitalization No Has patient had a PCN reaction occurring within the last 10 years: Yes  If all of the above answers are "NO", then may proceed with Cephalosporin use.   . Buprenorphine Hcl Rash  . Encainide Nausea And Vomiting    Social History   Socioeconomic History  . Marital status: Single    Spouse name: Not on file  . Number of children: 1  . Years of education: Not on file  . Highest education level: Not on file  Occupational History  . Occupation: Unemployed  Tobacco Use  . Smoking status: Never Smoker  .  Smokeless tobacco: Never Used  Vaping Use  . Vaping Use: Never used  Substance and Sexual Activity  . Alcohol use: No  . Drug use: Yes    Types: Marijuana    Comment: occ  . Sexual activity: Yes    Partners: Male    Birth control/protection: None  Other Topics Concern  . Not on file  Social History Narrative  . Not on file   Social Determinants of Health   Financial Resource Strain:   . Difficulty of Paying Living Expenses: Not on file  Food Insecurity:   . Worried About Charity fundraiser in the Last Year: Not on file  . Ran Out of Food in the Last Year: Not on file  Transportation Needs:   . Lack of Transportation (Medical): Not on file  . Lack of Transportation (Non-Medical): Not on file  Physical Activity:   . Days of Exercise per Week: Not on file  . Minutes of Exercise per Session: Not on file  Stress:   . Feeling of Stress : Not on file  Social Connections:   .  Frequency of Communication with Friends and Family: Not on file  . Frequency of Social Gatherings with Friends and Family: Not on file  . Attends Religious Services: Not on file  . Active Member of Clubs or Organizations: Not on file  . Attends Archivist Meetings: Not on file  . Marital Status: Not on file  Intimate Partner Violence:   . Fear of Current or Ex-Partner: Not on file  . Emotionally Abused: Not on file  . Physically Abused: Not on file  . Sexually Abused: Not on file    Family History  Problem Relation Age of Onset  . Cystic fibrosis Mother   . Hypertension Father   . Diabetes Brother   . Hypertension Maternal Grandmother     Past Surgical History:  Procedure Laterality Date  . CARDIAC SURGERY    . COLONOSCOPY  09/27/2014   at [facility]  . CORONARY ARTERY BYPASS GRAFT N/A 01/14/2018   Procedure: CORONARY ARTERY BYPASS GRAFTING (CABG)X3, RIGHT AND LEFT SAPHENOUS VEIN HARVEST, MAMMARY TAKE DOWN. MAMMARY TO LAD, SVG TO PD, SVG TO DISTAL CIRC.;  Surgeon: Grace Isaac, MD;  Location: [place];  Service: Open Heart Surgery;  Laterality: N/A;  . ESOPHAGOGASTRODUODENOSCOPY  09/27/2014   at [facility], Dr Rolan Lipa. biospy neg for celiac, neg for H pylori.   . EYE SURGERY    . gailstones    . INTRAMEDULLARY (IM) NAIL INTERTROCHANTERIC Right 05/10/2019   Procedure: INTRAMEDULLARY (IM) NAIL INTERTROCHANTRIC;  Surgeon: Lovell Sheehan, MD;  Location: [facility];  Service: Orthopedics;  Laterality: Right;  . IR FLUORO GUIDE CV LINE RIGHT  02/01/2017  . IR FLUORO GUIDE CV LINE RIGHT  03/06/2017  . IR FLUORO GUIDE CV LINE RIGHT  03/25/2017  . IR GENERIC HISTORICAL  01/24/2016   IR FLUORO GUIDE CV LINE RIGHT 01/24/2016 Darrell K Allred, PA-C [facility]  . IR GENERIC HISTORICAL  01/24/2016   IR US GUIDE VASC ACCESS RIGHT 01/24/2016 Darrell K Allred, PA-C [facility]  . IR US GUIDE VASC ACCESS RIGHT  02/01/2017  . IR US GUIDE VASC ACCESS RIGHT  03/06/2017  . IR US GUIDE VASC ACCESS  RIGHT  03/25/2017  . IR US GUIDE VASC ACCESS RIGHT  01/20/2019  . IR VENIPUNCTURE 39YRS/OLDER BY MD  01/20/2019  . LEFT HEART CATH AND CORONARY ANGIOGRAPHY N/A 01/07/2018   Procedure: LEFT HEART CATH  AND CORONARY ANGIOGRAPHY;  Surgeon: Troy Sine, MD;  Location: [place] CV LAB;  Service: Cardiovascular;  Laterality: N/A;  . POSTERIOR VITRECTOMY AND MEMBRANE PEEL-LEFT EYE  09/28/2002  . POSTERIOR VITRECTOMY AND MEMBRANE PEEL-RIGHT EYE  03/16/2002  . RETINAL DETACHMENT SURGERY    . TEE WITHOUT CARDIOVERSION N/A 01/14/2018   Procedure: TRANSESOPHAGEAL ECHOCARDIOGRAM (TEE);  Surgeon: Grace Isaac, MD;  Location: [place];  Service: Open Heart Surgery;  Laterality: N/A;    ROS: Review of Systems Negative except as stated above  PHYSICAL EXAM: Vitals with BMI 02/15/2020 11/03/2019 10/21/2019  Height - 5' 1"  5' 1"   Weight 159 lbs 6 oz 163 lbs 169 lbs 13 oz  BMI - 08.14 48.1  Systolic 856 314 970  Diastolic 72 73 70  Pulse 69 68 77   Wt Readings from Last 3 Encounters:  02/15/20 159 lb 6.4 oz (72.3 kg)  11/03/19 163 lb (73.9 kg)  10/21/19 169 lb 12.8 oz (77 kg)    Physical Exam   General appearance - alert, well appearing, and in no distress and oriented to person, place, and time Mental status - alert, oriented to person, place, and time, normal mood, behavior, speech, dress, motor activity, and thought processes Neck - supple, no significant adenopathy Lymphatics - no palpable lymphadenopathy, no hepatosplenomegaly Chest - clear to auscultation, no wheezes, rales or rhonchi, symmetric air entry, no tachypnea, retractions or cyanosis Heart - normal rate, regular rhythm, normal S1, S2, no murmurs, rubs, clicks or gallops Neurological - alert, oriented, normal speech, no focal findings or movement disorder noted  Results for orders placed or performed in visit on 02/15/20  POCT glycosylated hemoglobin (Hb A1C)  Result Value Ref Range   Hemoglobin A1C     HbA1c POC (<> result,  manual entry)     HbA1c, POC (prediabetic range)     HbA1c, POC (controlled diabetic range) 8.1 (A) 0.0 - 7.0 %  Glucose (CBG)  Result Value Ref Range   POC Glucose 173 (A) 70 - 99 mg/dl    ASSESSMENT AND PLAN: 1. Hospital discharge follow-up: 2. Uncontrolled type 1 diabetes mellitus with hyperglycemia [facility]): - Patient feeling better since discharge from [facility] on 02/03/2020. Reports she is feeling better since beginning Reglan at [facility]. Denies any recent nausea and vomiting. Reports eating and drinking as normal.  - Hemoglobin A1C not at goal at 8.1%, goal < 7%. Increased hemoglobin A1C related to uncontrolled type 1 diabetes. Follow-up with Endocrinology September 2021. - Blood glucose elevated during today's visit at 173, patient asymptomatic, and non-fasting. - Continue Omnipod insulin pump as prescribed by Endocrinology.  - Keep all appointments with Endocrinology.  - Will not refill Reglan at this time as patient has medium severity allergic reaction to this including dystonia, muscle rigidity, slurred speech, and tongue swelling. - Continue Ondansetron as prescribed.   - Follow-up with primary physician as scheduled or sooner if needed. - POCT glycosylated hemoglobin (Hb A1C) - Glucose (CBG)  Patient was given the opportunity to ask questions.  Patient verbalized understanding of the plan and was able to repeat key elements of the plan. Patient was given clear instructions to go to Emergency Department or return to medical center if symptoms don't improve, worsen, or new problems develop.The patient verbalized understanding.   Camillia Herter, NP

## 2020-02-15 NOTE — Telephone Encounter (Signed)
Will forward to Amy

## 2020-02-15 NOTE — Progress Notes (Signed)
Discussed hemoglobin A1C and blood glucose in clinic.

## 2020-02-16 NOTE — Telephone Encounter (Signed)
Patient requesting call back from clinical staff to discuss.

## 2020-02-16 NOTE — Telephone Encounter (Signed)
Pt is calling back requesting clinical staff to discuss with her about this medicine. States # as [phone] Pt is upset as to why she can not get her medication.

## 2020-02-17 ENCOUNTER — Telehealth: Payer: Self-pay | Admitting: Endocrinology

## 2020-02-17 NOTE — Telephone Encounter (Signed)
Faxed appt confirmation back to [facility]

## 2020-02-17 NOTE — Telephone Encounter (Signed)
Patient was called and she states that she was given metoclopramide while in the hospital, she states that she was discharged with a 30 day supply form the hospital.  She states that when she was given medication in the hospital she had no reaction.

## 2020-02-17 NOTE — Telephone Encounter (Signed)
That medication appears on her allergy list.

## 2020-02-17 NOTE — Telephone Encounter (Signed)
Patient is requesting refill on Metoclopramide medication.

## 2020-02-18 NOTE — Telephone Encounter (Signed)
Patient is calling again to hear from the nurse or doctor regarding her medication that was given at the hospital.  Please respond and call patient to discuss at [phone]

## 2020-02-19 DIAGNOSIS — R6889 Other general symptoms and signs: Secondary | ICD-10-CM | POA: Diagnosis not present

## 2020-02-19 DIAGNOSIS — M84351A Stress fracture, right femur, initial encounter for fracture: Secondary | ICD-10-CM | POA: Diagnosis not present

## 2020-02-19 NOTE — Telephone Encounter (Signed)
Patient is calling again and has called several times for her medication.  She stated that she is about to run out and does not understand why she has not gotten a response.  Please call patient at [phone]

## 2020-02-19 NOTE — Telephone Encounter (Signed)
Patient's sister, [name] is calling upset that patient is unable to get her medication refill. And if the patient is unable to receive her medication refill she will end up in the hospital. And there will be a lawsuit. Advised the patient's sister that she was not on patient's HIPA form. And the office was closed for lunch at this time.  Please advise Cb- [phone]

## 2020-02-19 NOTE — Telephone Encounter (Signed)
She got a response from me on on 02/17/20. Please see my notes. Please see her allergy from this medication 'Dystonia, muscle rigidity. Patient states she is only allergic to the "pill form, not the IV" Slurred speech, tongue swelling' I do not feel comfortable prescribing this; she will need to obtain from her GI or previous prescriber.

## 2020-02-19 NOTE — Telephone Encounter (Signed)
Pt called asking to speak to the doctor.  She is stating she is not allergic to th medication anymore.  CB#  [phone]

## 2020-02-19 NOTE — Telephone Encounter (Signed)
Patient was called to be informed that PCP will not fill the requested medication.  Patient got very upset and states that she has been taking the medication for 30 days now and she has not had any reaction. States that medication help with her gastro issue, also states that if she does not have the medication she will end up back in the hospital. Patient was informed that message will be sent to PCP.

## 2020-02-19 NOTE — Telephone Encounter (Signed)
Patient is calling back for this message she calling back on the medication request [phone]

## 2020-02-19 NOTE — Telephone Encounter (Signed)
Call placed to patient and mailbox is currently full. 

## 2020-02-22 MED ORDER — ONDANSETRON HCL 4 MG PO TABS: 4.0000 mg | ORAL_TABLET | Freq: Three times a day (TID) | ORAL | 1 refills | Status: DC | PRN

## 2020-02-22 NOTE — Telephone Encounter (Signed)
Zofran is what I was prescribing her previously and I have sent a refill to her Pharmacy. Unfortunately due to her listed severe allergy to Reglan I will not be prescribing it; she might have to obtain it from GI or the  other prescriber.

## 2020-02-23 ENCOUNTER — Telehealth: Payer: Self-pay | Admitting: Family Medicine

## 2020-02-23 IMAGING — CR DG PELVIS 1-2V
1 series · 1 of 1 positions shown · non-contrast
Comparison: None.

CLINICAL DATA: Fall.  Right leg pain

EXAM:
PELVIS - 1-2 VIEW

[pelvis ap]
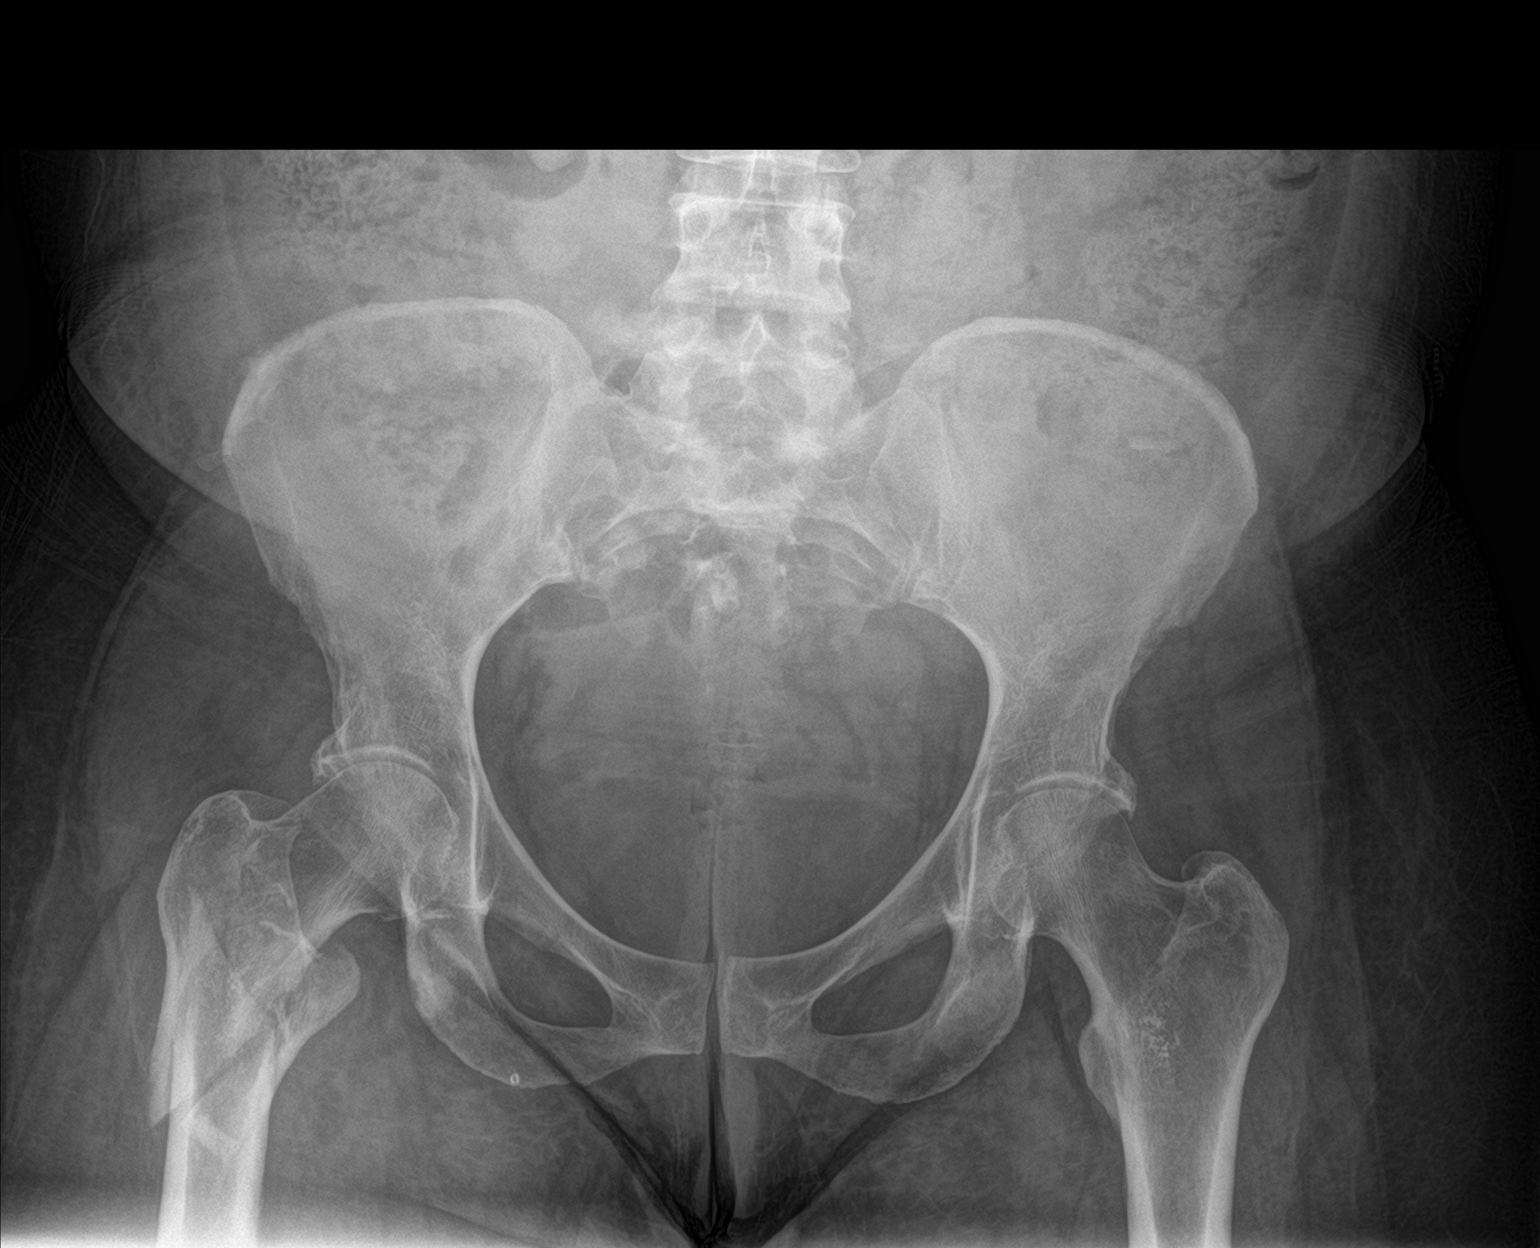

[1 of 1 positions shown; findings below may reference images not displayed]

FINDINGS: Fracture through the proximal right femoral shaft just below the
intertrochanteric region. Mild varus angulation. No subluxation or
dislocation. Hip joints and SI joints are symmetric.
IMPRESSION: Fracture through the proximal right femoral shaft with varus
angulation.

## 2020-02-23 IMAGING — CR DG FEMUR 2+V*R*
4 series · 4 of 4 positions shown · non-contrast
Comparison: Pelvis study today

CLINICAL DATA: Fall

EXAM:
RIGHT FEMUR 2 VIEWS

[femur ap (1 of 2)]
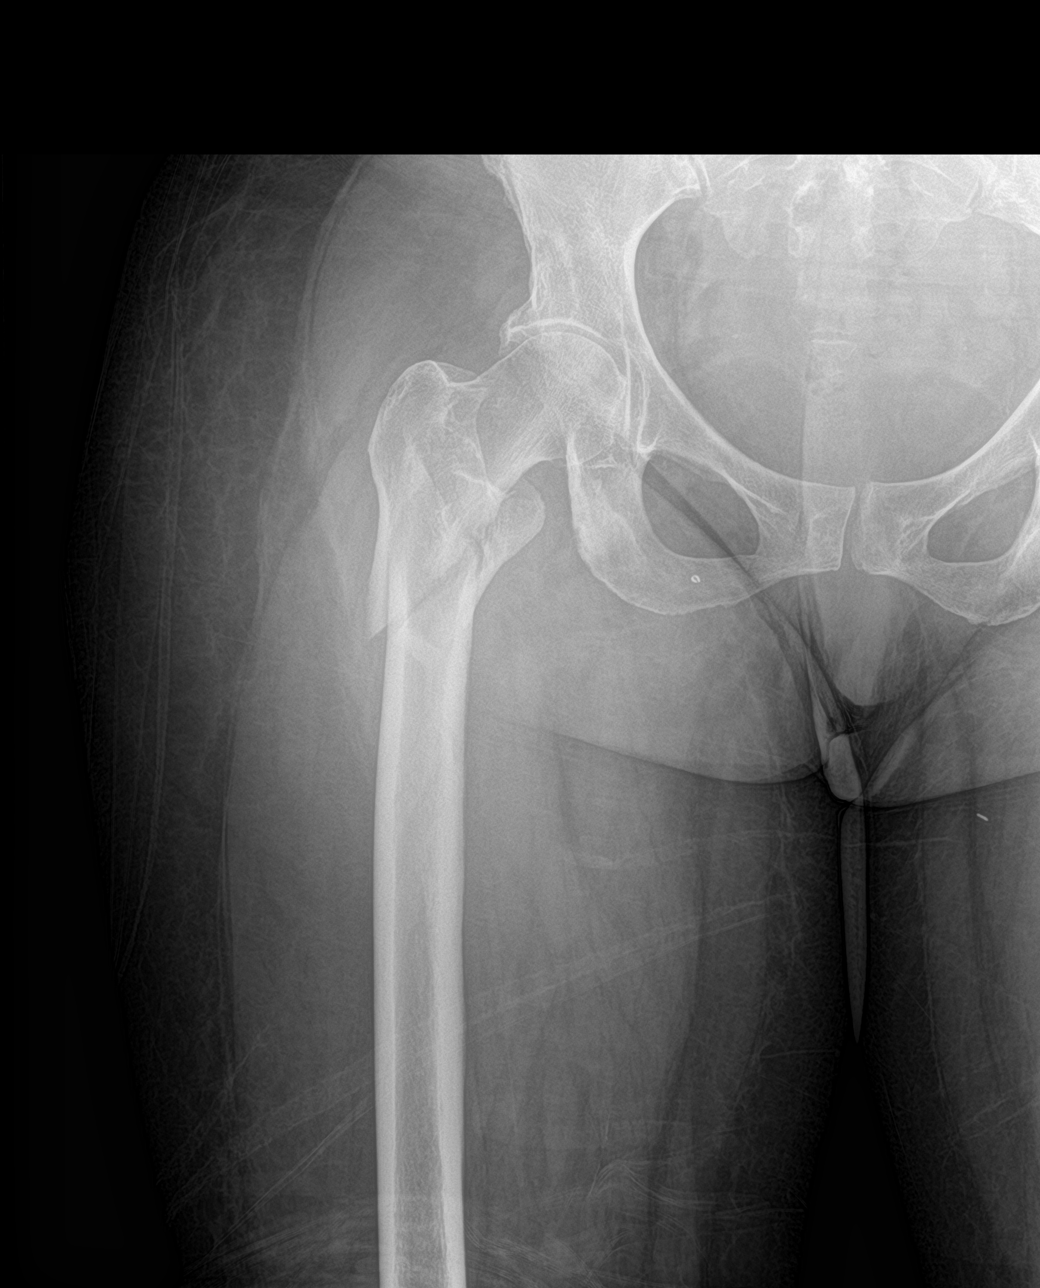

[femur ap (2 of 2)]
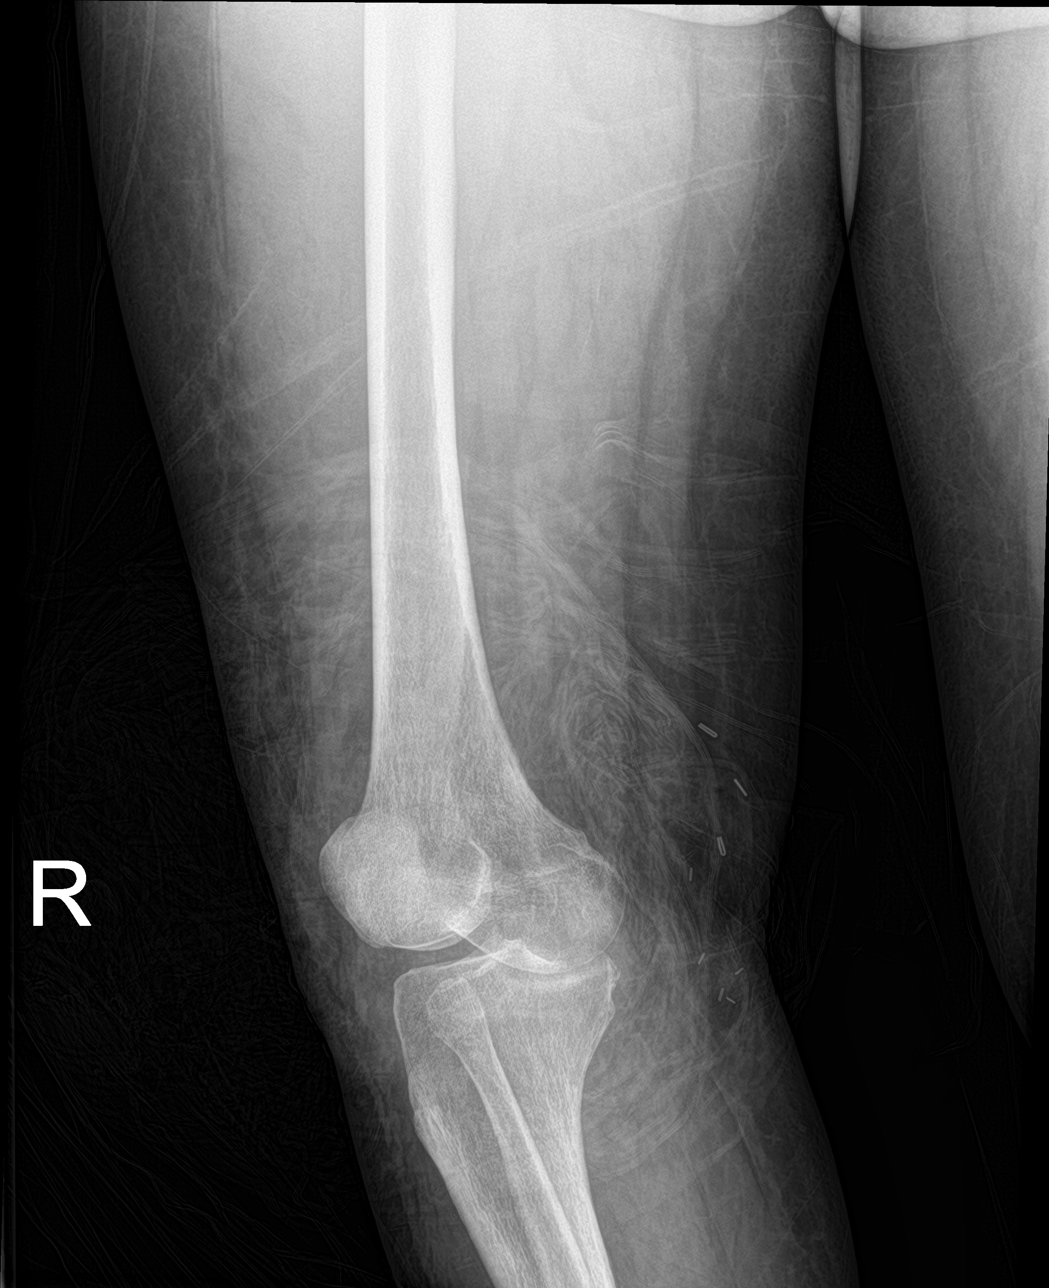

[femur lat (1 of 2)]
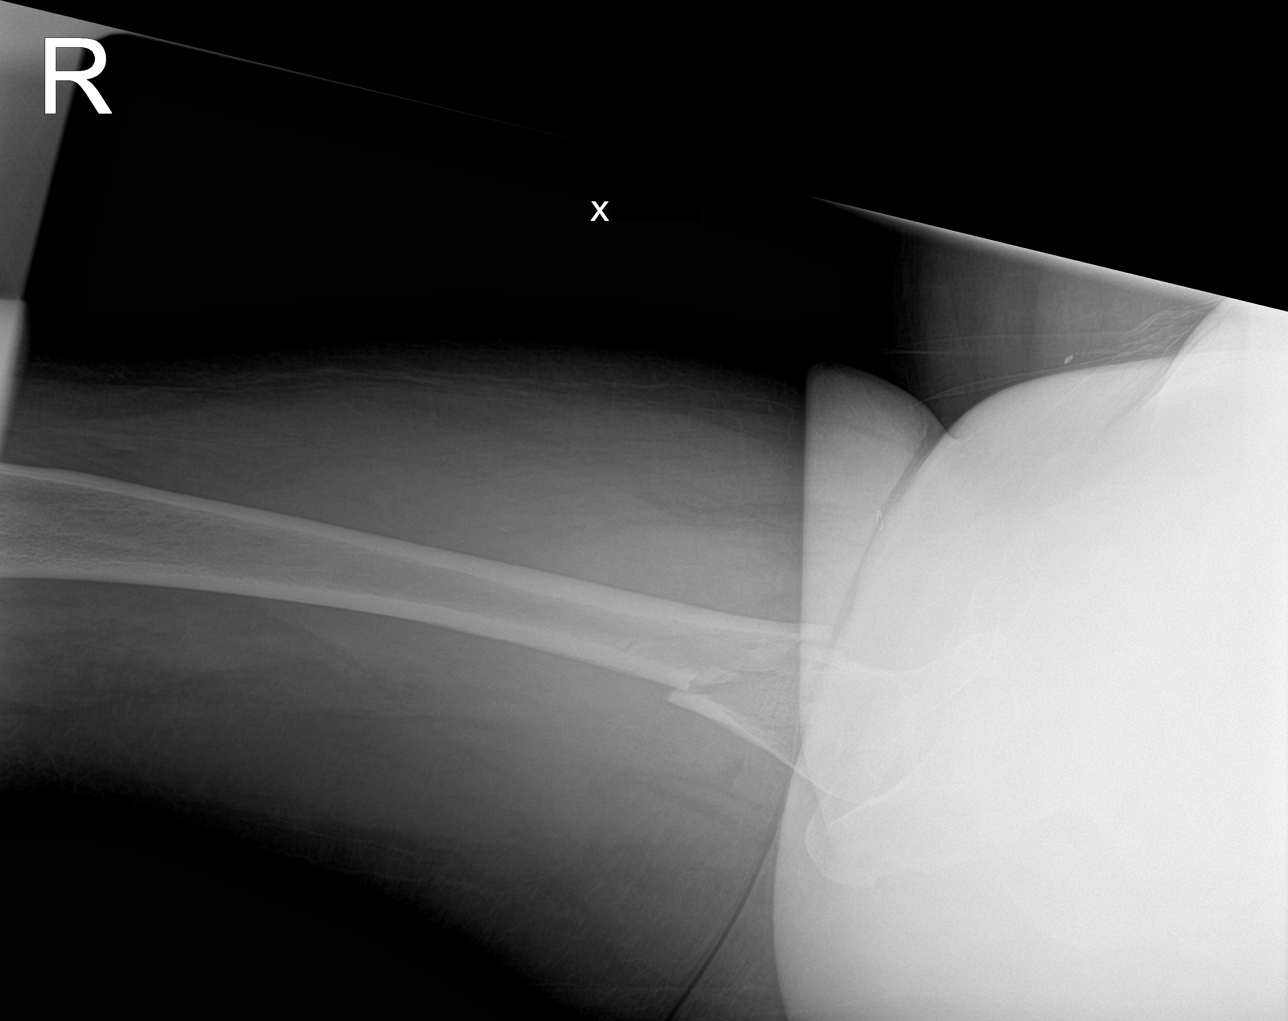

[femur lat (2 of 2)]
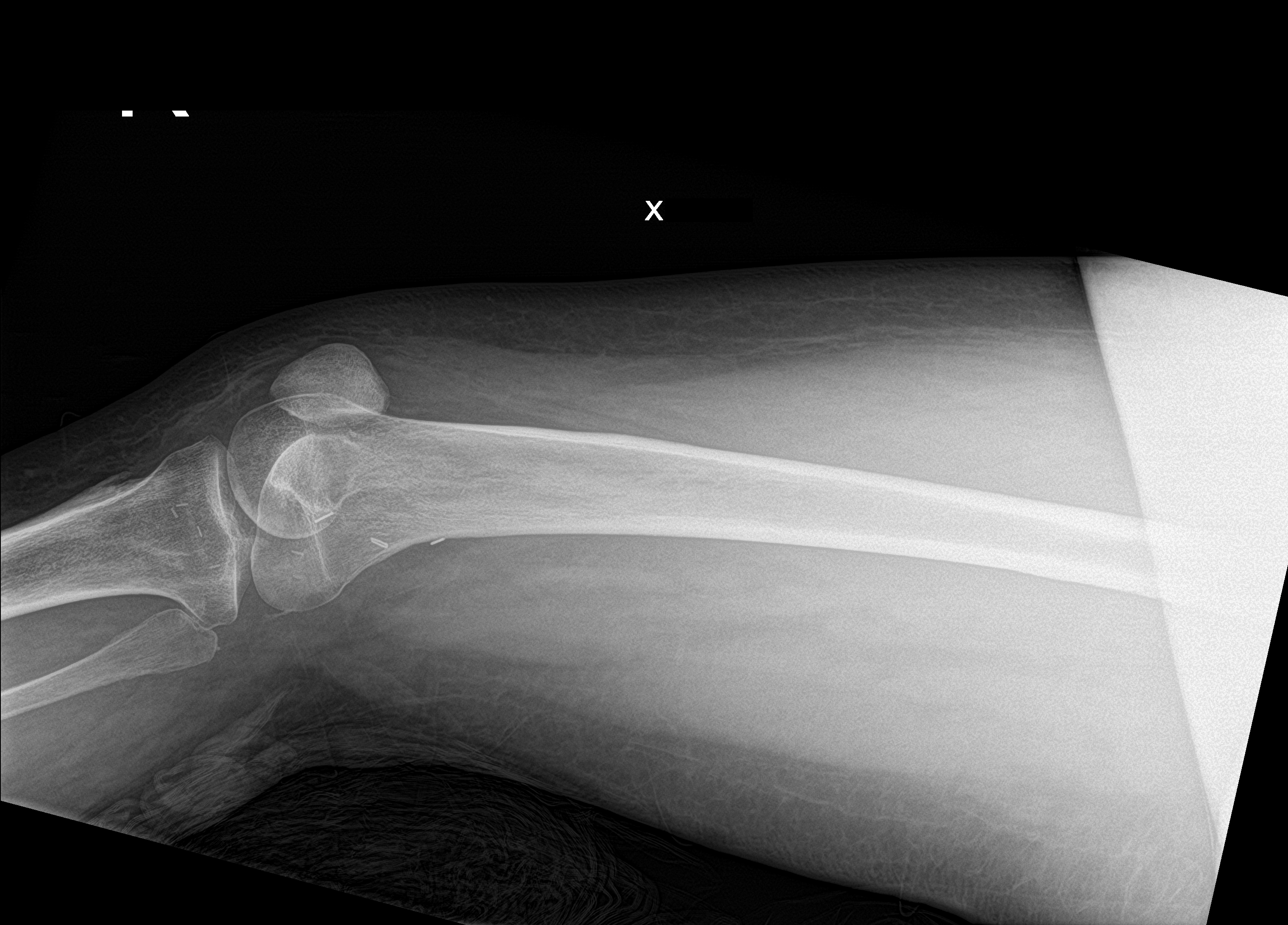

[4 of 4 positions shown; findings below may reference images not displayed]

FINDINGS: Fracture through the proximal right femoral shaft just below the
intertrochanteric region. Mild varus angulation. No subluxation or
dislocation. Right hip joint and knee joint unremarkable.
IMPRESSION: Fracture through the proximal right femoral shaft with varus
angulation.

## 2020-02-23 IMAGING — CR DG CHEST 1V
1 series · 1 of 1 positions shown · non-contrast
Comparison: 04/27/2019

CLINICAL DATA: Recent fall with chest pain, initial encounter

EXAM:
CHEST  1 VIEW

[chest pa]
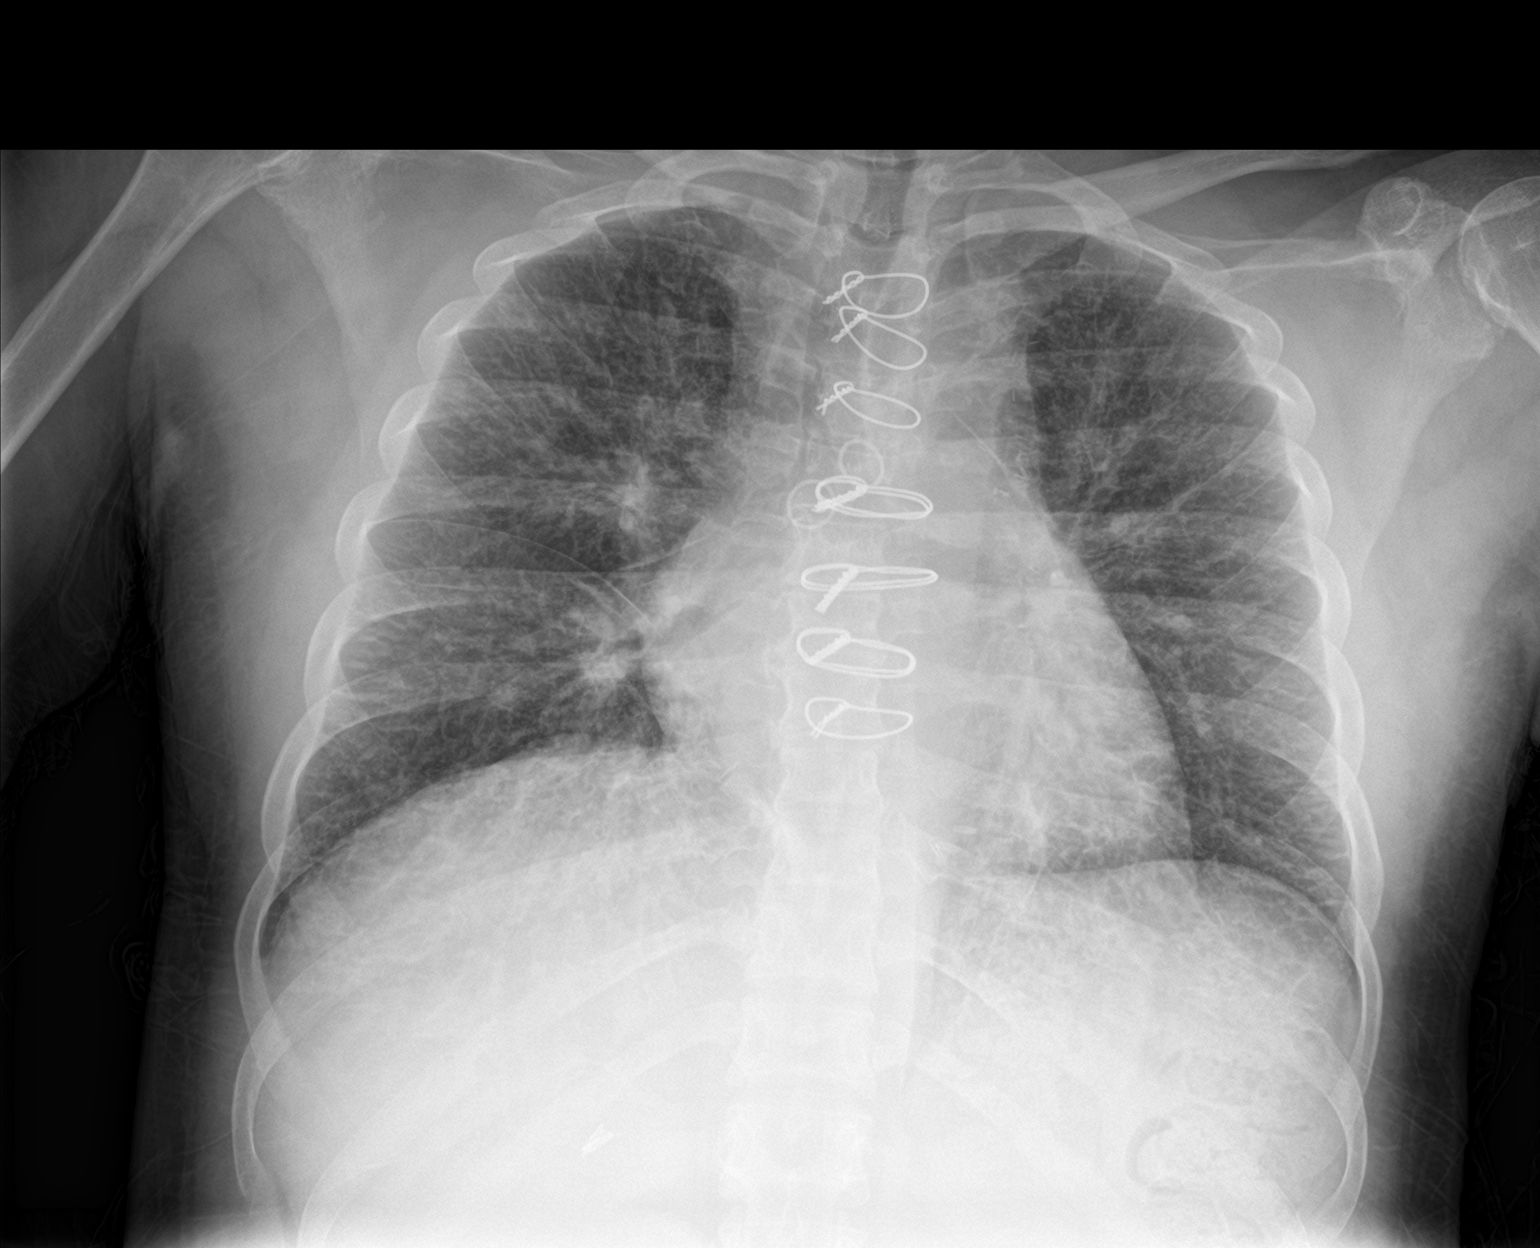

[1 of 1 positions shown; findings below may reference images not displayed]

FINDINGS: Cardiac shadow is stable. Postoperative changes are seen. Diffuse
vascular congestion and interstitial edema is noted consistent with
early CHF. No pleural effusion is noted. No focal infiltrate is
seen. No bony abnormality is noted.
IMPRESSION: Changes consistent with early CHF.

## 2020-02-23 NOTE — Telephone Encounter (Signed)
Will route for review.

## 2020-02-23 NOTE — Telephone Encounter (Signed)
Copied from [place] [id number]. Topic: General - Other >> Feb 23, 2020 10:04 AM Leward Quan A wrote: Reason for CRM: Eber [name] with [facility] called to inform Dr Margarita Rana that they were referred to patient for PT but after many phone calls and messages left there have been no response. Patient have already missed 3 visits and he would like to see how Dr Margarita Rana want [facility] to deal with this situation. Went to home knocked on door but no answer left card.  Please advise Ph# [phone]

## 2020-02-23 NOTE — Telephone Encounter (Signed)
Can you please see if you can reach her or if she is not interested in PT?  Thanks.

## 2020-02-23 NOTE — Telephone Encounter (Signed)
Copied from [place] [phone]. Topic: General - Other >> Feb 23, 2020 12:37 PM Yvette Rack wrote: Reason for CRM: Felicia with [facility] stated that she has not been able to reach the patient. Felicia stated that they have been by the patient's home but no one comes to the door and they have not been able to leave a message at the number they have for patient's son or sister.

## 2020-02-24 IMAGING — CR DG HIP (WITH PELVIS) OPERATIVE*R*
7 of 10 series · 7 of 10 positions shown · non-contrast
Comparison: 05/09/2019

CLINICAL DATA: IM nail right femur

EXAM:
OPERATIVE right HIP (WITH PELVIS IF PERFORMED) 15 VIEWS
TECHNIQUE: Fluoroscopic spot image(s) were submitted for interpretation
post-operatively.

[cont. (1 of 7)]
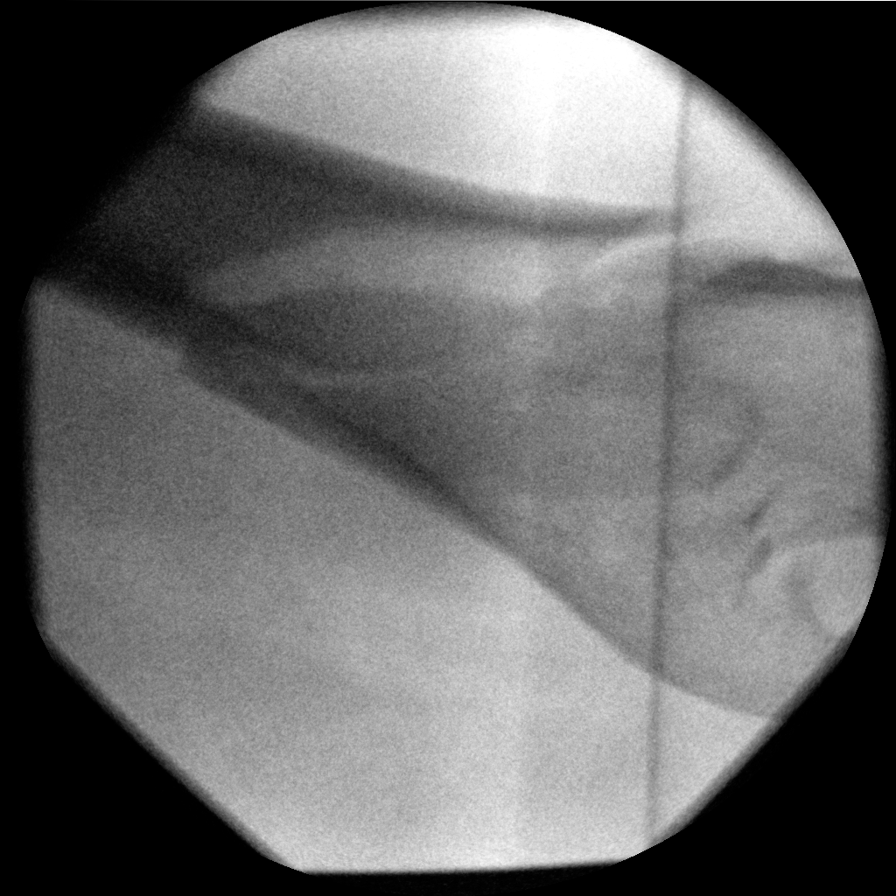

[cont. (2 of 7)]
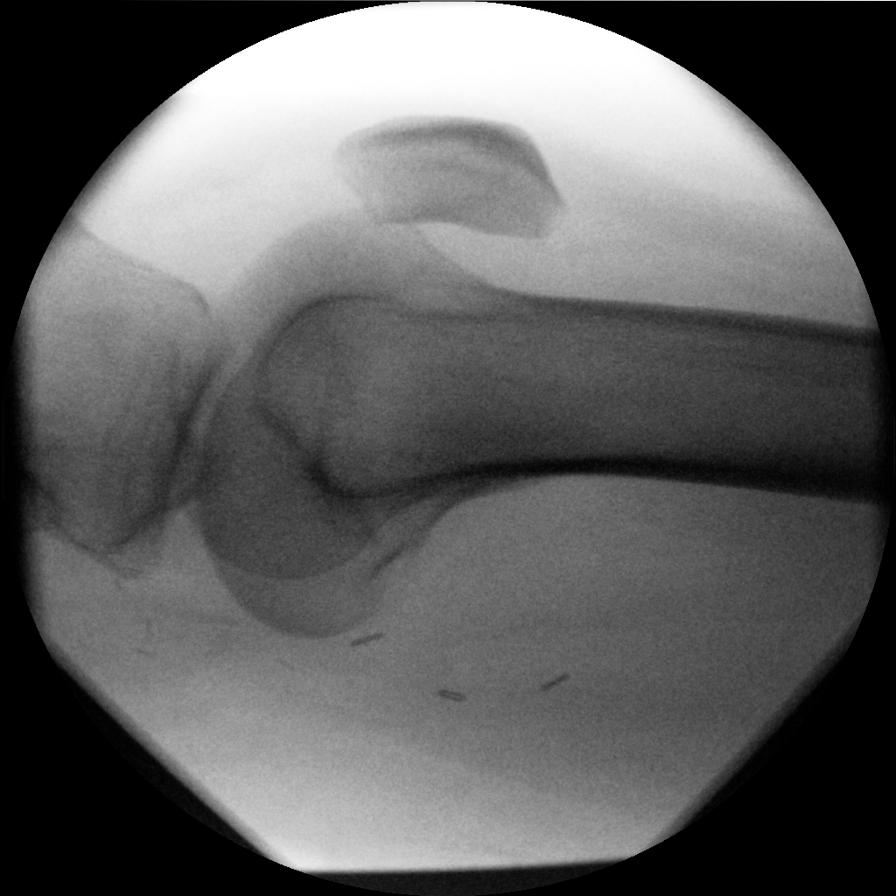

[cont. (3 of 7)]
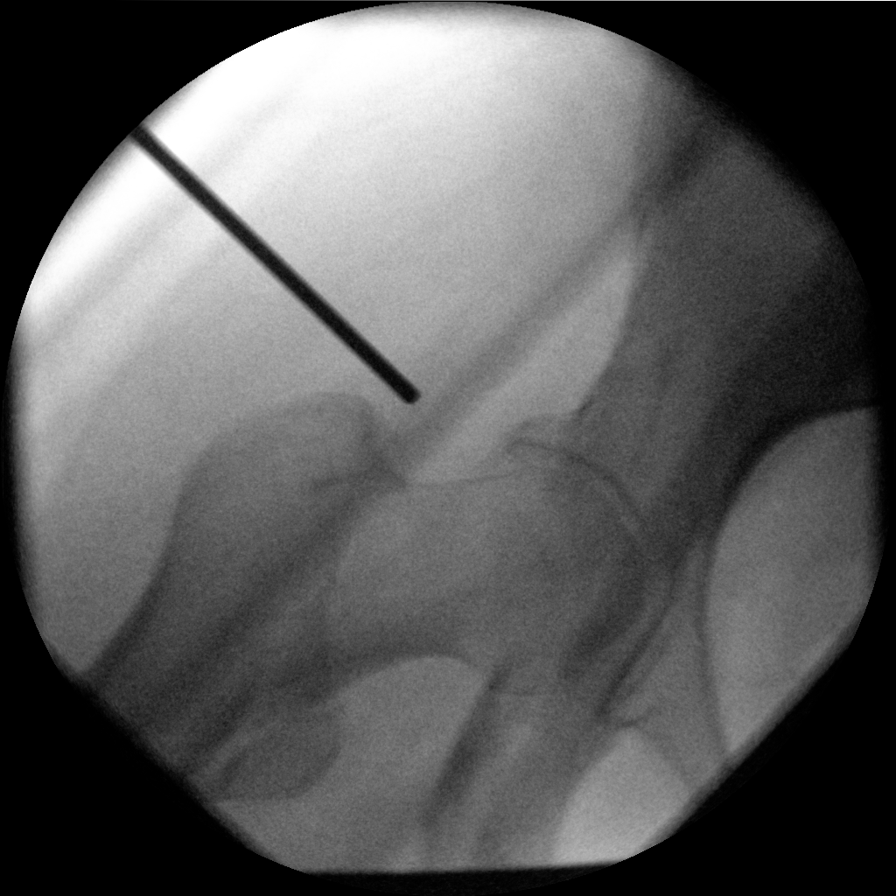

[cont. (4 of 7)]
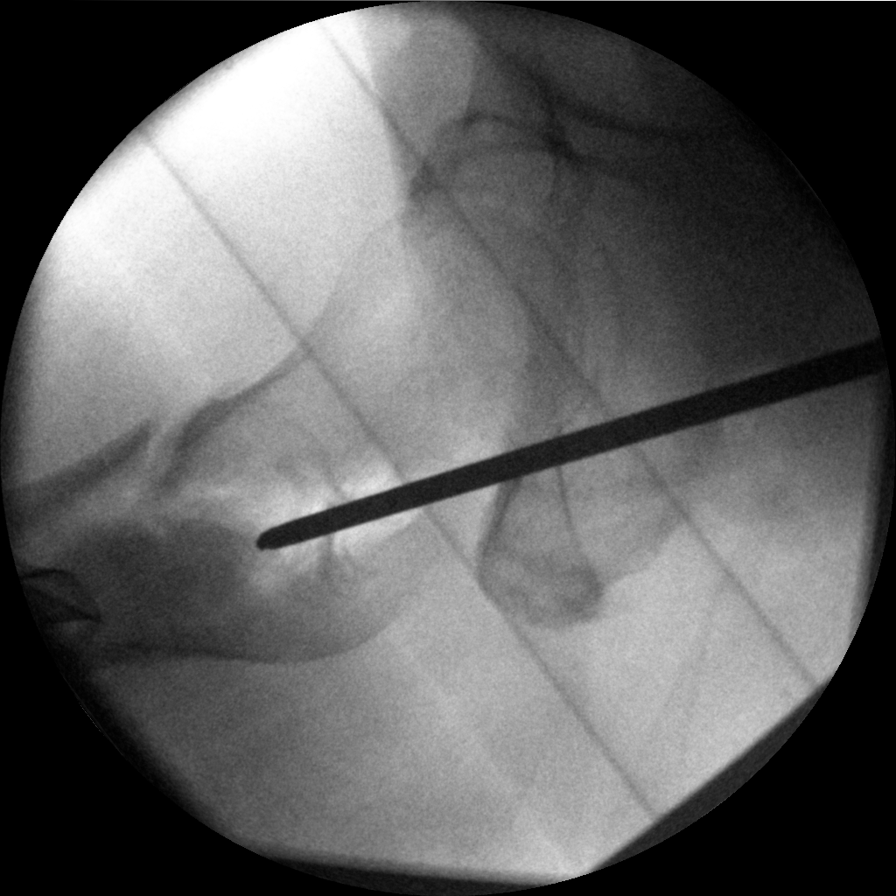

[cont. (5 of 7)]
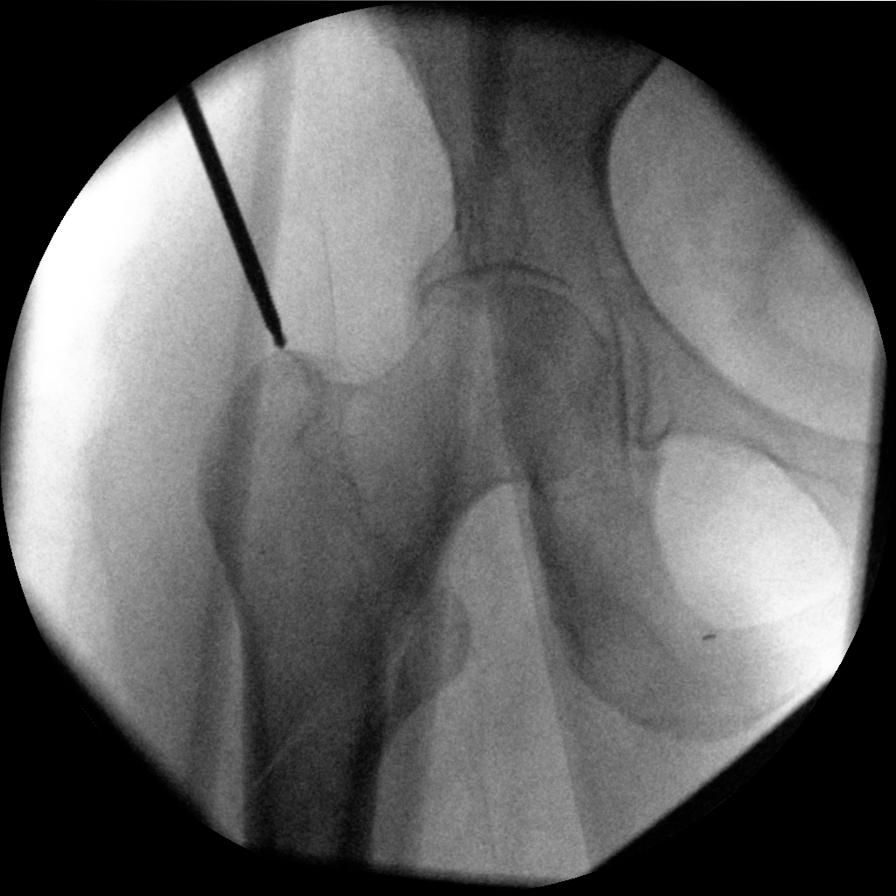

[cont. (6 of 7)]
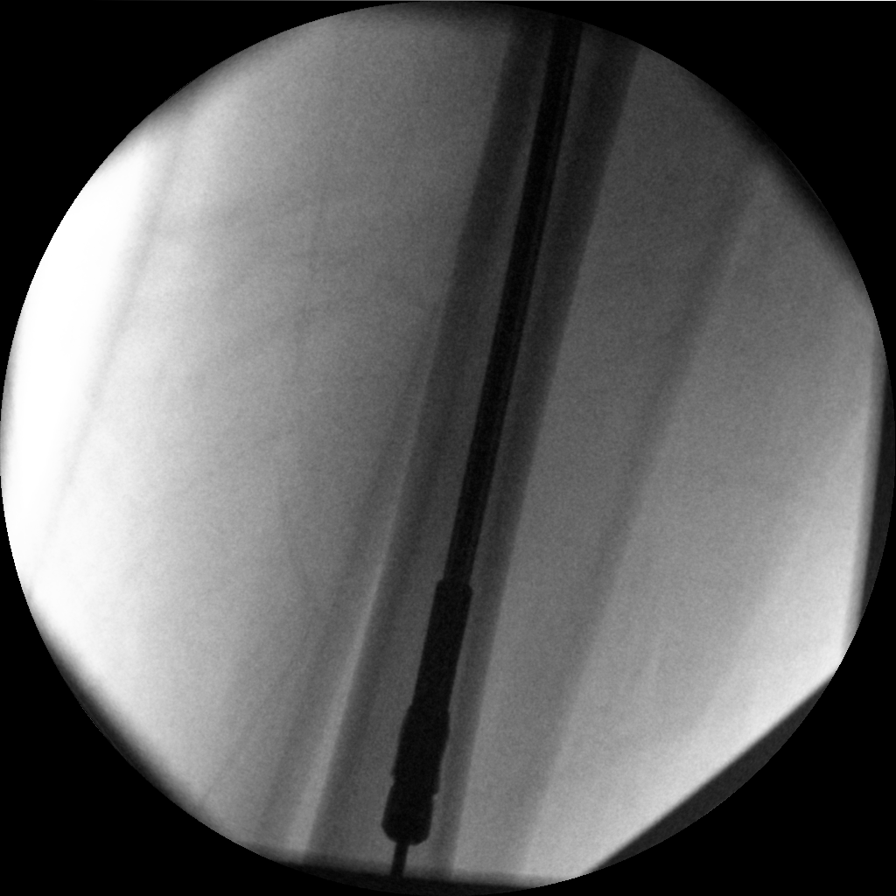

[cont. (7 of 7)]
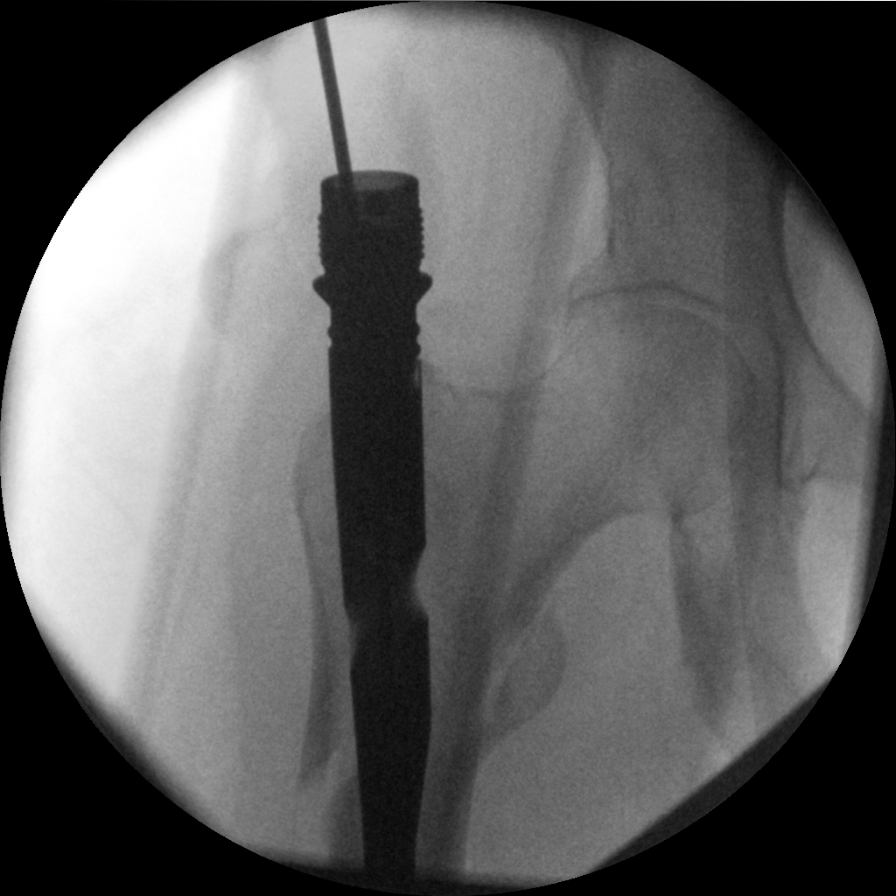

[7 of 10 positions shown; findings below may reference images not displayed]

FINDINGS: Fifteen low resolution intraoperative spot views of the right hip
and femur. Total fluoroscopy time was 1 minutes 8 seconds. The
images demonstrate a right subtrochanteric fracture. Subsequent
intramedullary rod and screw fixation of the right femur.
IMPRESSION: Intraoperative fluoroscopic assistance provided during surgical
fixation of right proximal femur fracture

## 2020-02-26 ENCOUNTER — Other Ambulatory Visit: Payer: Self-pay

## 2020-02-26 NOTE — Patient Outreach (Signed)
[place] [facility]) Care Management  02/26/2020  [name] 12-29-1965 [id number]   Telephone call to patient for hospital follow up. No answer. Unable to leave a voice message.  Voice mail full.    Plan: RN CM will close case.   [name], RN, MSN [place] Management Care Management Coordinator Direct Line [phone] Cell [phone] Toll Free: [phone]  Fax: [phone]

## 2020-03-01 ENCOUNTER — Telehealth: Payer: Self-pay | Admitting: Endocrinology

## 2020-03-01 ENCOUNTER — Other Ambulatory Visit: Payer: Self-pay | Admitting: *Deleted

## 2020-03-01 ENCOUNTER — Other Ambulatory Visit: Payer: Self-pay | Admitting: Endocrinology

## 2020-03-01 ENCOUNTER — Telehealth: Payer: Self-pay | Admitting: *Deleted

## 2020-03-01 ENCOUNTER — Other Ambulatory Visit: Payer: Self-pay

## 2020-03-01 ENCOUNTER — Encounter: Payer: Self-pay | Admitting: Endocrinology

## 2020-03-01 ENCOUNTER — Ambulatory Visit: Payer: Medicare HMO | Admitting: Endocrinology

## 2020-03-01 VITALS — BP 120/74 | HR 83 | Ht 61.0 in | Wt 156.6 lb

## 2020-03-01 DIAGNOSIS — E785 Hyperlipidemia, unspecified: Secondary | ICD-10-CM

## 2020-03-01 DIAGNOSIS — E1143 Type 2 diabetes mellitus with diabetic autonomic (poly)neuropathy: Secondary | ICD-10-CM

## 2020-03-01 DIAGNOSIS — K3184 Gastroparesis: Secondary | ICD-10-CM

## 2020-03-01 DIAGNOSIS — E1069 Type 1 diabetes mellitus with other specified complication: Secondary | ICD-10-CM

## 2020-03-01 DIAGNOSIS — E1065 Type 1 diabetes mellitus with hyperglycemia: Secondary | ICD-10-CM | POA: Diagnosis not present

## 2020-03-01 MED ORDER — METOCLOPRAMIDE HCL 10 MG PO TABS
10.0000 mg | ORAL_TABLET | Freq: Three times a day (TID) | ORAL | 1 refills | Status: DC
Start: 2020-03-01 — End: 2020-04-25

## 2020-03-01 MED ORDER — PREGABALIN 150 MG PO CAPS: 150.0000 mg | ORAL_CAPSULE | Freq: Two times a day (BID) | ORAL | 1 refills | Status: DC

## 2020-03-01 MED ORDER — INSULIN ASPART 100 UNIT/ML ~~LOC~~ SOLN: SUBCUTANEOUS | 0 refills | Status: DC

## 2020-03-01 MED ORDER — INSULIN ASPART 100 UNIT/ML ~~LOC~~ SOLN: SUBCUTANEOUS | 3 refills | Status: DC

## 2020-03-01 NOTE — Telephone Encounter (Signed)
Patient requests to be called at ph# [phone] re: discussing RX for insulin that was sent in today 03/01/20.

## 2020-03-01 NOTE — Progress Notes (Signed)
Patient ID: [name], female   DOB: 1965-12-15, 54 y.o.   MRN: [id number]           Reason for Appointment : Follow-up for Type 1 Diabetes  History of Present Illness          Diagnosis: Type 1 diabetes mellitus, date of diagnosis: 1977         Previous history:  She has mostly been followed by primary care physicians for her diabetes with consistently poor control She has had periodic episodes of ketoacidosis Previous insulin regimen:  BASAL insulin: LANTUS 15 units in a.m. MEALTIME insulin: Novolog variable doses: Usually 6-8 at breakfast, 2-3 at lunch and dinner   Recent history:   INSULIN regimen: OmniPod insulin pump since 07/07/2019  Basal rate: Midnight-5 AM = 0.90 units, 5 AM to 11 AM = 0.6, 11 AM-12 AM = 0.85 I/C ratio 1:1;   ISF: 40, target:120 with correction over 120   A1c is 8.2 She has not been seen in follow-up since 4/21  Current management, blood sugar patterns and problems identified:    She has been intermittently using her Dexcom but her date including the year is incorrectly programmed and not clear what her recent data is showing  She also accidentally lost her transmitter about couple of weeks ago  She is recently not entering blood sugars very much in her pump from her Contour meter  As before she is having periodic hyperglycemia especially after meals especially in the last week of August  Limited review of her recent Dexcom indicates somewhat higher readings in the early part of the night, relatively better readings between about 7 AM-12 noon and significant HYPERGLYCEMIA between 1 PM and 10 PM  She is afraid of bolusing enough because of fear of hypoglycemia and sometimes will bolus only 1 unit for her meals and occasionally up to 4 units  She does not have any hypoglycemia except a couple of low normal or low readings on waking up for early morning  She is not having as much vomiting and is able to eat better  Recently however has lost a  little weight  Glucose monitoring:  is being done 4+ times a day         PRE-MEAL Fasting Lunch Dinner Bedtime Overall  Glucose range:  51-250  157-435  104-435  192-246   Mean/median:  130  274  287   235   POST-MEAL PC Breakfast PC Lunch PC Dinner  Glucose range:   300, 386   Mean/median:       Symptoms of hypoglycemia: Weakness, sometimes none Treatment of hypoglycemia: Snacks         Self-care: The diet that the patient has been following is: None  Mealtimes are: Breakfast egg, cheese, sausage, occasionally grits, toast;   Lunch: Sandwich or only snacks, dinner: baked chricken, rice, potatoes                Dietician consultation: Most recent: years ago.         CDE consultation: 04/2018  Wt Readings from Last 3 Encounters:  03/01/20 156 lb 9.6 oz (71 kg)  02/15/20 159 lb 6.4 oz (72.3 kg)  11/03/19 163 lb (73.9 kg)   Diabetes labs:  Lab Results  Component Value Date   HGBA1C 8.1 (A) 02/15/2020   HGBA1C 7.2 (A) 07/28/2019   HGBA1C 8.8 (A) 04/15/2019   Lab Results  Component Value Date   MICROALBUR 14.3 (H) 04/28/2018   LDLCALC 54 01/07/2018  CREATININE 1.33 (H) 05/15/2019    Lab Results  Component Value Date   MICRALBCREAT 17.1 04/28/2018   MICRALBCREAT 254 (H) 09/04/2016     Allergies as of 03/01/2020      Reactions   Metoclopramide Other (See Comments)   Dystonia, muscle rigidity.  Patient states she is only allergic to the "pill form, not the IV" Slurred speech, tongue swelling   Anesthetics, Amide Nausea And Vomiting   Chlorhexidine    Penicillins Diarrhea, Nausea And Vomiting, Other (See Comments)   Has patient had a PCN reaction causing immediate rash, facial/tongue/throat swelling, SOB or lightheadedness with hypotension: Yes Has patient had a PCN reaction causing severe rash involving mucus membranes or skin necrosis: No Has patient had a PCN reaction that required hospitalization No Has patient had a PCN reaction occurring within the last 10  years: Yes  If all of the above answers are "NO", then may proceed with Cephalosporin use.   Buprenorphine Hcl Rash   Encainide Nausea And Vomiting      Medication List       Accurate as of March 01, 2020 11:55 AM. If you have any questions, ask your nurse or doctor.        STOP taking these medications   erythromycin 250 MG tablet Commonly known as: ERYTHROCIN Stopped by: Elayne Snare, MD     TAKE these medications   ammonium lactate 12 % cream Commonly known as: AMLACTIN Apply topically as needed for dry skin.   aspirin EC 81 MG tablet Take 1 tablet (81 mg total) by mouth daily.   atorvastatin 40 MG tablet Commonly known as: LIPITOR Take 1 tablet (40 mg total) by mouth daily.   busPIRone 10 MG tablet Commonly known as: BUSPAR Take 1 tablet (10 mg total) by mouth 3 (three) times daily.   clindamycin 300 MG capsule Commonly known as: CLEOCIN   Contour Test test strip Generic drug: glucose blood 1 each by Other route 4 (four) times daily. E11.9   docusate sodium 100 MG capsule Commonly known as: Colace Take 1 capsule (100 mg total) by mouth daily as needed for mild constipation.   furosemide 40 MG tablet Commonly known as: LASIX Take 1 tablet (40 mg total) by mouth every other day.   HYDROcodone-acetaminophen 5-325 MG tablet Commonly known as: NORCO/VICODIN Take 1 tablet by mouth as needed.   insulin aspart 100 UNIT/ML injection Commonly known as: novoLOG INJECT 50 UNITS INTO THE SKIN DAILY. USE IN INSULIN PUMP   Insulin Syringe-Needle U-100 31G X 5/16" 0.5 ML Misc Commonly known as: BD Insulin Syringe Ultrafine 1 each by Does not apply route 4 (four) times daily.   loperamide 2 MG capsule Commonly known as: IMODIUM Take 1 capsule (2 mg total) by mouth 3 (three) times daily as needed for diarrhea or loose stools.   losartan 25 MG tablet Commonly known as: COZAAR Take 1 tablet (25 mg total) by mouth daily.   metoprolol tartrate 25 MG  tablet Commonly known as: LOPRESSOR Take 0.5 tablets (12.5 mg total) by mouth 2 (two) times daily.   nitrofurantoin (macrocrystal-monohydrate) 100 MG capsule Commonly known as: Macrobid Take 1 capsule (100 mg total) by mouth 2 (two) times daily.   Adel Kit by Does not apply route.   ondansetron 4 MG tablet Commonly known as: Zofran Take 1 tablet (4 mg total) by mouth every 8 (eight) hours as needed for nausea or vomiting.   pantoprazole 40 MG tablet Commonly known as: PROTONIX  Take 1 tablet (40 mg total) by mouth daily.   polyethylene glycol 17 g packet Commonly known as: MIRALAX / GLYCOLAX Take 17 g by mouth 2 (two) times daily as needed for moderate constipation.   pregabalin 150 MG capsule Commonly known as: Lyrica Take 1 capsule (150 mg total) by mouth 2 (two) times daily.   pyridostigmine 60 MG tablet Commonly known as: MESTINON Take 60 mg by mouth 3 (three) times daily.       Allergies:  Allergies  Allergen Reactions  . Metoclopramide Other (See Comments)    Dystonia, muscle rigidity.  Patient states she is only allergic to the "pill form, not the IV" Slurred speech, tongue swelling  . Anesthetics, Amide Nausea And Vomiting  . Chlorhexidine   . Penicillins Diarrhea, Nausea And Vomiting and Other (See Comments)    Has patient had a PCN reaction causing immediate rash, facial/tongue/throat swelling, SOB or lightheadedness with hypotension: Yes Has patient had a PCN reaction causing severe rash involving mucus membranes or skin necrosis: No Has patient had a PCN reaction that required hospitalization No Has patient had a PCN reaction occurring within the last 10 years: Yes  If all of the above answers are "NO", then may proceed with Cephalosporin use.   . Buprenorphine Hcl Rash  . Encainide Nausea And Vomiting    Past Medical History:  Diagnosis Date  . AKI (acute kidney injury) ([place])   . Anemia, iron deficiency   . Anxiety and depression  05/18/2015  . CAD (coronary artery disease), native coronary artery 01/11/2018   s/p CABG - Non-STEMI with severe three-vessel disease noted July 2019  . Depression   . Diabetic gastroparesis ([place])   . Diabetic neuropathy, type I diabetes mellitus ([place]) 05/18/2015  . DKA, type 1 ([place]) 03/18/2016  . Essential hypertension   . Gastroparesis   . GERD (gastroesophageal reflux disease)   . HLD (hyperlipidemia)   . MDD (major depressive disorder), single episode, severe , no psychosis ([place])   . Tardive dyskinesia   . Vitamin B12 deficiency 08/16/2015    Past Surgical History:  Procedure Laterality Date  . CARDIAC SURGERY    . COLONOSCOPY  09/27/2014   at [facility]  . CORONARY ARTERY BYPASS GRAFT N/A 01/14/2018   Procedure: CORONARY ARTERY BYPASS GRAFTING (CABG)X3, RIGHT AND LEFT SAPHENOUS VEIN HARVEST, MAMMARY TAKE DOWN. MAMMARY TO LAD, SVG TO PD, SVG TO DISTAL CIRC.;  Surgeon: Grace Isaac, MD;  Location: [facility];  Service: Open Heart Surgery;  Laterality: N/A;  . ESOPHAGOGASTRODUODENOSCOPY  09/27/2014   at [facility], Dr Rolan Lipa. biospy neg for celiac, neg for H pylori.   . EYE SURGERY    . gailstones    . INTRAMEDULLARY (IM) NAIL INTERTROCHANTERIC Right 05/10/2019   Procedure: INTRAMEDULLARY (IM) NAIL INTERTROCHANTRIC;  Surgeon: Lovell Sheehan, MD;  Location: [facility] ORS;  Service: Orthopedics;  Laterality: Right;  . IR FLUORO GUIDE CV LINE RIGHT  02/01/2017  . IR FLUORO GUIDE CV LINE RIGHT  03/06/2017  . IR FLUORO GUIDE CV LINE RIGHT  03/25/2017  . IR GENERIC HISTORICAL  01/24/2016   IR FLUORO GUIDE CV LINE RIGHT 01/24/2016 Darrell K Allred, PA-C [facility]  . IR GENERIC HISTORICAL  01/24/2016   IR US GUIDE VASC ACCESS RIGHT 01/24/2016 Darrell K Allred, PA-C [facility]  . IR US GUIDE VASC ACCESS RIGHT  02/01/2017  . IR US GUIDE VASC ACCESS RIGHT  03/06/2017  . IR US GUIDE VASC ACCESS RIGHT  03/25/2017  .  IR US GUIDE VASC ACCESS RIGHT  01/20/2019  . IR VENIPUNCTURE 66YRS/OLDER BY MD  01/20/2019    . LEFT HEART CATH AND CORONARY ANGIOGRAPHY N/A 01/07/2018   Procedure: LEFT HEART CATH AND CORONARY ANGIOGRAPHY;  Surgeon: Troy Sine, MD;  Location: [place] CV LAB;  Service: Cardiovascular;  Laterality: N/A;  . POSTERIOR VITRECTOMY AND MEMBRANE PEEL-LEFT EYE  09/28/2002  . POSTERIOR VITRECTOMY AND MEMBRANE PEEL-RIGHT EYE  03/16/2002  . RETINAL DETACHMENT SURGERY    . TEE WITHOUT CARDIOVERSION N/A 01/14/2018   Procedure: TRANSESOPHAGEAL ECHOCARDIOGRAM (TEE);  Surgeon: Grace Isaac, MD;  Location: [place];  Service: Open Heart Surgery;  Laterality: N/A;    Family History  Problem Relation Age of Onset  . Cystic fibrosis Mother   . Hypertension Father   . Diabetes Brother   . Hypertension Maternal Grandmother     Social History:  reports that she has never smoked. She has never used smokeless tobacco. She reports current drug use. Drug: Marijuana. She reports that she does not drink alcohol.      Review of Systems      Lipids: She had been taking atorvastatin for history of CAD, now, prescribed by cardiologist but no recent labs available  Lab Results  Component Value Date   CHOL 121 01/07/2018   HDL 47 01/07/2018   LDLCALC 54 01/07/2018   TRIG 101 01/07/2018   CHOLHDL 2.6 01/07/2018     DIABETES COMPLICATIONS: Gastroparesis treated with Phenergan as needed  Had intolerance to Reglan because of tremor previously but now she says she is taking it without difficulty  She was advised to start erythromycin for gastroparesis but she could not afford the co-pay She has again has had admissions to the hospital because of episodes of vomiting   On Lyrica 150 mg for peripheral neuropathy with pain, taking this long-term    Physical Examination:  BP 120/74 (BP Location: Left Arm, Patient Position: Sitting, Cuff Size: Normal)   Pulse 83   Ht _0  (1.549 m)   Wt 156 lb 9.6 oz (71 kg)   SpO2 96%   BMI 29.59 kg/m       ASSESSMENT:  Diabetes type 1,  long-standing with multiple complications  See history of present illness for detailed discussion of current diabetes management, blood sugar patterns and problems identified  Her A1c is last 7.2  Although she is using the Dexcom difficult to analyze it because of her date and time being incorrect Overall appears to have hyperglycemia in the afternoons and evenings but to variable extent Also some of her hyperglycemia is related to inadequate bolusing because of fear of hypoglycemia, only taking 1 unit bolus at some meals despite better intake overall   Gastroparesis: She appears to be benefiting from Reglan and reports no side effects from 10 mg dose, she is asking for refill  Neuropathy: Symptoms relatively controlled with Lyrica and she will continue  PLAN:   New basal rate settings: 12 AM = 0.95, 4 AM = 0.55, 12 PM = 1.0 and 10 PM = 0.85 Basal rate settings were changed for her She needs to bolus as soon as she finishes eating and make sure she is estimating her carbohydrates  She likely needs at least 2 to 4 units for most meals based on her meal size in addition to correction factor Check blood sugars consistently with the Dexcom, new starter kit given to her Date and time programmed in her Dexcom reader  She can suspend  her pump and have some carbohydrates when her blood sugars are nearing 70 on her Dexcom alarm Make sure she is monitoring her blood sugars after meals and taking correction doses when blood sugars are heading over 200 To call if she is having any hypoglycemia consistently  Continue Reglan which she is tolerating now  There are no Patient Instructions on file for this visit.    Elayne Snare 03/01/2020, 11:55 AM     Note: This note was prepared with Dragon voice recognition system technology. Any transcriptional errors that result from this process are unintentional.

## 2020-03-02 NOTE — Telephone Encounter (Signed)
Pt was advised by front desk that her Rx had been sent and that she can pick up the medication when she is due for refill. Pt expressed understanding and voiced no further questions or concerns at this time.

## 2020-03-02 NOTE — Telephone Encounter (Signed)
Could you please contact pt and see exactly what her question is?

## 2020-03-02 NOTE — Telephone Encounter (Signed)
error 

## 2020-03-02 NOTE — Telephone Encounter (Signed)
In Dr. Ronnie Derby assistant's absence, attempted to call pt to further clarify and/or inquire about her concern below. LVM requesting returned call.

## 2020-03-04 DIAGNOSIS — E8881 Metabolic syndrome: Secondary | ICD-10-CM | POA: Diagnosis not present

## 2020-03-04 DIAGNOSIS — A419 Sepsis, unspecified organism: Secondary | ICD-10-CM | POA: Diagnosis not present

## 2020-03-04 DIAGNOSIS — F411 Generalized anxiety disorder: Secondary | ICD-10-CM | POA: Diagnosis not present

## 2020-03-04 DIAGNOSIS — E1065 Type 1 diabetes mellitus with hyperglycemia: Secondary | ICD-10-CM | POA: Diagnosis not present

## 2020-03-04 DIAGNOSIS — I10 Essential (primary) hypertension: Secondary | ICD-10-CM | POA: Diagnosis not present

## 2020-03-04 DIAGNOSIS — R4020 Unspecified coma: Secondary | ICD-10-CM | POA: Diagnosis not present

## 2020-03-04 DIAGNOSIS — K219 Gastro-esophageal reflux disease without esophagitis: Secondary | ICD-10-CM | POA: Diagnosis not present

## 2020-03-04 DIAGNOSIS — R197 Diarrhea, unspecified: Secondary | ICD-10-CM | POA: Diagnosis not present

## 2020-03-04 DIAGNOSIS — G9341 Metabolic encephalopathy: Secondary | ICD-10-CM | POA: Diagnosis not present

## 2020-03-04 DIAGNOSIS — K449 Diaphragmatic hernia without obstruction or gangrene: Secondary | ICD-10-CM | POA: Diagnosis not present

## 2020-03-04 DIAGNOSIS — E1021 Type 1 diabetes mellitus with diabetic nephropathy: Secondary | ICD-10-CM | POA: Diagnosis not present

## 2020-03-04 DIAGNOSIS — I110 Hypertensive heart disease with heart failure: Secondary | ICD-10-CM | POA: Diagnosis not present

## 2020-03-04 DIAGNOSIS — R188 Other ascites: Secondary | ICD-10-CM | POA: Diagnosis not present

## 2020-03-04 DIAGNOSIS — E1010 Type 1 diabetes mellitus with ketoacidosis without coma: Secondary | ICD-10-CM | POA: Diagnosis not present

## 2020-03-04 DIAGNOSIS — I2510 Atherosclerotic heart disease of native coronary artery without angina pectoris: Secondary | ICD-10-CM | POA: Diagnosis not present

## 2020-03-04 DIAGNOSIS — R52 Pain, unspecified: Secondary | ICD-10-CM | POA: Diagnosis not present

## 2020-03-04 DIAGNOSIS — K3184 Gastroparesis: Secondary | ICD-10-CM | POA: Diagnosis not present

## 2020-03-04 DIAGNOSIS — E1043 Type 1 diabetes mellitus with diabetic autonomic (poly)neuropathy: Secondary | ICD-10-CM | POA: Diagnosis not present

## 2020-03-04 DIAGNOSIS — E860 Dehydration: Secondary | ICD-10-CM | POA: Diagnosis not present

## 2020-03-04 DIAGNOSIS — R404 Transient alteration of awareness: Secondary | ICD-10-CM | POA: Diagnosis not present

## 2020-03-04 DIAGNOSIS — Z20822 Contact with and (suspected) exposure to covid-19: Secondary | ICD-10-CM | POA: Diagnosis not present

## 2020-03-04 DIAGNOSIS — I471 Supraventricular tachycardia: Secondary | ICD-10-CM | POA: Diagnosis not present

## 2020-03-04 DIAGNOSIS — E785 Hyperlipidemia, unspecified: Secondary | ICD-10-CM | POA: Diagnosis not present

## 2020-03-04 DIAGNOSIS — N179 Acute kidney failure, unspecified: Secondary | ICD-10-CM | POA: Diagnosis not present

## 2020-03-04 DIAGNOSIS — Z9641 Presence of insulin pump (external) (internal): Secondary | ICD-10-CM | POA: Diagnosis not present

## 2020-03-04 DIAGNOSIS — I509 Heart failure, unspecified: Secondary | ICD-10-CM | POA: Diagnosis not present

## 2020-03-04 DIAGNOSIS — Z9981 Dependence on supplemental oxygen: Secondary | ICD-10-CM | POA: Diagnosis not present

## 2020-03-04 DIAGNOSIS — R6521 Severe sepsis with septic shock: Secondary | ICD-10-CM | POA: Diagnosis not present

## 2020-03-04 DIAGNOSIS — Z9049 Acquired absence of other specified parts of digestive tract: Secondary | ICD-10-CM | POA: Diagnosis not present

## 2020-03-04 DIAGNOSIS — R6889 Other general symptoms and signs: Secondary | ICD-10-CM | POA: Diagnosis not present

## 2020-03-04 DIAGNOSIS — J9601 Acute respiratory failure with hypoxia: Secondary | ICD-10-CM | POA: Diagnosis not present

## 2020-03-04 DIAGNOSIS — E1165 Type 2 diabetes mellitus with hyperglycemia: Secondary | ICD-10-CM | POA: Diagnosis not present

## 2020-03-04 DIAGNOSIS — R109 Unspecified abdominal pain: Secondary | ICD-10-CM | POA: Diagnosis not present

## 2020-03-04 DIAGNOSIS — Z794 Long term (current) use of insulin: Secondary | ICD-10-CM | POA: Diagnosis not present

## 2020-03-05 DIAGNOSIS — E1010 Type 1 diabetes mellitus with ketoacidosis without coma: Secondary | ICD-10-CM | POA: Diagnosis not present

## 2020-03-05 DIAGNOSIS — R6521 Severe sepsis with septic shock: Secondary | ICD-10-CM | POA: Diagnosis not present

## 2020-03-05 DIAGNOSIS — R188 Other ascites: Secondary | ICD-10-CM | POA: Diagnosis not present

## 2020-03-05 DIAGNOSIS — K449 Diaphragmatic hernia without obstruction or gangrene: Secondary | ICD-10-CM | POA: Diagnosis not present

## 2020-03-05 DIAGNOSIS — Z9981 Dependence on supplemental oxygen: Secondary | ICD-10-CM | POA: Diagnosis not present

## 2020-03-05 DIAGNOSIS — Z9641 Presence of insulin pump (external) (internal): Secondary | ICD-10-CM | POA: Diagnosis not present

## 2020-03-05 DIAGNOSIS — Z9049 Acquired absence of other specified parts of digestive tract: Secondary | ICD-10-CM | POA: Diagnosis not present

## 2020-03-07 DIAGNOSIS — K3184 Gastroparesis: Secondary | ICD-10-CM | POA: Diagnosis not present

## 2020-03-07 DIAGNOSIS — E1010 Type 1 diabetes mellitus with ketoacidosis without coma: Secondary | ICD-10-CM | POA: Diagnosis not present

## 2020-03-07 DIAGNOSIS — I10 Essential (primary) hypertension: Secondary | ICD-10-CM | POA: Diagnosis not present

## 2020-03-07 DIAGNOSIS — R197 Diarrhea, unspecified: Secondary | ICD-10-CM | POA: Diagnosis not present

## 2020-03-07 DIAGNOSIS — R109 Unspecified abdominal pain: Secondary | ICD-10-CM | POA: Diagnosis not present

## 2020-03-07 DIAGNOSIS — R6889 Other general symptoms and signs: Secondary | ICD-10-CM | POA: Diagnosis not present

## 2020-03-07 DIAGNOSIS — E785 Hyperlipidemia, unspecified: Secondary | ICD-10-CM | POA: Diagnosis not present

## 2020-03-07 DIAGNOSIS — Z9641 Presence of insulin pump (external) (internal): Secondary | ICD-10-CM | POA: Diagnosis not present

## 2020-03-08 ENCOUNTER — Ambulatory Visit: Payer: Medicare HMO | Attending: Orthopedic Surgery | Admitting: Physical Therapy

## 2020-03-08 DIAGNOSIS — R600 Localized edema: Secondary | ICD-10-CM | POA: Insufficient documentation

## 2020-03-08 DIAGNOSIS — M25551 Pain in right hip: Secondary | ICD-10-CM | POA: Insufficient documentation

## 2020-03-08 DIAGNOSIS — Z9641 Presence of insulin pump (external) (internal): Secondary | ICD-10-CM | POA: Diagnosis not present

## 2020-03-08 DIAGNOSIS — E1021 Type 1 diabetes mellitus with diabetic nephropathy: Secondary | ICD-10-CM | POA: Diagnosis not present

## 2020-03-08 DIAGNOSIS — M6281 Muscle weakness (generalized): Secondary | ICD-10-CM | POA: Insufficient documentation

## 2020-03-08 DIAGNOSIS — E1010 Type 1 diabetes mellitus with ketoacidosis without coma: Secondary | ICD-10-CM | POA: Diagnosis not present

## 2020-03-08 DIAGNOSIS — Z7409 Other reduced mobility: Secondary | ICD-10-CM | POA: Insufficient documentation

## 2020-03-08 DIAGNOSIS — E1065 Type 1 diabetes mellitus with hyperglycemia: Secondary | ICD-10-CM | POA: Diagnosis not present

## 2020-03-10 ENCOUNTER — Other Ambulatory Visit: Payer: Self-pay

## 2020-03-10 NOTE — Patient Outreach (Signed)
[place] [facility]) Care Management  03/10/2020  [name] 05-26-1966 [id number]   Referral Date: 03/10/20 Referral Source: [employer] report Date of Discharge: 03/08/20 Facility: [place]:  [facility]  Outreach attempt: No answer.  Mail box full.     Plan: RN CM will attempt again within 4 business days and send letter.   [name], RN, MSN [facility] Care Management Care Management Coordinator Direct Line [phone] Toll Free: [phone]  Fax: [phone]

## 2020-03-11 ENCOUNTER — Other Ambulatory Visit: Payer: Self-pay

## 2020-03-11 NOTE — Patient Outreach (Signed)
[place] [facility]) Care Management  03/11/2020  Tateanna Bach Aug 20, 1965 [id number]   Referral Date: 03/10/20 Referral Source: [employer] report Date of Discharge: 03/08/20 Facility: [place]:  [facility]  Outreach attempt: No answer.  Mail box full.     Plan: RN CM will attempt again within 4 business days.  [name], RN, MSN [place] Management Care Management Coordinator Direct Line [phone] Cell [phone] Toll Free: [phone]  Fax: [phone]

## 2020-03-14 ENCOUNTER — Other Ambulatory Visit: Payer: Self-pay

## 2020-03-14 NOTE — Patient Outreach (Signed)
[place] [facility]) Care Management  03/14/2020  [name] 1966-01-02 [id number]   Referral Date:03/10/20 Referral Source:[employer] report Date of Discharge:03/08/20 Facility:[facility] Insurance:[employer]  Outreach attempt:No answer. Mail box full.    Plan: RN CM willattempt again in the month of October for 4th attempt.  [name], RN, MSN [place] Management Care Management Coordinator Direct Line [phone] Cell [phone] Toll Free: [phone]  Fax: [phone]

## 2020-03-21 ENCOUNTER — Other Ambulatory Visit: Payer: Self-pay

## 2020-03-21 ENCOUNTER — Ambulatory Visit: Payer: Medicare HMO

## 2020-03-21 DIAGNOSIS — R600 Localized edema: Secondary | ICD-10-CM

## 2020-03-21 DIAGNOSIS — Z7409 Other reduced mobility: Secondary | ICD-10-CM | POA: Diagnosis not present

## 2020-03-21 DIAGNOSIS — M25551 Pain in right hip: Secondary | ICD-10-CM | POA: Diagnosis not present

## 2020-03-21 DIAGNOSIS — M6281 Muscle weakness (generalized): Secondary | ICD-10-CM | POA: Diagnosis not present

## 2020-03-21 NOTE — Therapy (Signed)
[place]. [place], [place], [address] Phone: [phone]   Fax:  [phone]  Physical Therapy Evaluation  Patient Details  Name: [name] MRN: [id number] Date of Birth: 01/01/1966 Referring Provider (PT): Kurtis Bushman, MD   Encounter Date: 03/21/2020   PT End of Session - 03/21/20 1134    Visit Number 1    Number of Visits 12    Date for PT Re-Evaluation 05/30/20    Authorization Type [employer] Medicare    Progress Note Due on Visit 10    PT Start Time 1002    PT Stop Time 1045    PT Time Calculation (min) 43 min    Activity Tolerance Patient tolerated treatment well    Behavior During Therapy [facility] for tasks assessed/performed           Past Medical History:  Diagnosis Date  . AKI (acute kidney injury) ([place])   . Anemia, iron deficiency   . Anxiety and depression 05/18/2015  . CAD (coronary artery disease), native coronary artery 01/11/2018   s/p CABG - Non-STEMI with severe three-vessel disease noted July 2019  . Depression   . Diabetic gastroparesis ([place])   . Diabetic neuropathy, type I diabetes mellitus ([place]) 05/18/2015  . DKA, type 1 ([place]) 03/18/2016  . Essential hypertension   . Gastroparesis   . GERD (gastroesophageal reflux disease)   . HLD (hyperlipidemia)   . MDD (major depressive disorder), single episode, severe , no psychosis ([place])   . Tardive dyskinesia   . Vitamin B12 deficiency 08/16/2015    Past Surgical History:  Procedure Laterality Date  . CARDIAC SURGERY    . COLONOSCOPY  09/27/2014   at [facility]  . CORONARY ARTERY BYPASS GRAFT N/A 01/14/2018   Procedure: CORONARY ARTERY BYPASS GRAFTING (CABG)X3, RIGHT AND LEFT SAPHENOUS VEIN HARVEST, MAMMARY TAKE DOWN. MAMMARY TO LAD, SVG TO PD, SVG TO DISTAL CIRC.;  Surgeon: Grace Isaac, MD;  Location: [place];  Service: Open Heart Surgery;  Laterality: N/A;  . ESOPHAGOGASTRODUODENOSCOPY  09/27/2014   at [facility], Dr Rolan Lipa. biospy  neg for celiac, neg for H pylori.   . EYE SURGERY    . gailstones    . INTRAMEDULLARY (IM) NAIL INTERTROCHANTERIC Right 05/10/2019   Procedure: INTRAMEDULLARY (IM) NAIL INTERTROCHANTRIC;  Surgeon: Lovell Sheehan, MD;  Location: [facility];  Service: Orthopedics;  Laterality: Right;  . IR FLUORO GUIDE CV LINE RIGHT  02/01/2017  . IR FLUORO GUIDE CV LINE RIGHT  03/06/2017  . IR FLUORO GUIDE CV LINE RIGHT  03/25/2017  . IR GENERIC HISTORICAL  01/24/2016   IR FLUORO GUIDE CV LINE RIGHT 01/24/2016 Darrell K Allred, PA-C [facility]  . IR GENERIC HISTORICAL  01/24/2016   IR US GUIDE VASC ACCESS RIGHT 01/24/2016 Darrell K Allred, PA-C [facility]  . IR US GUIDE VASC ACCESS RIGHT  02/01/2017  . IR US GUIDE VASC ACCESS RIGHT  03/06/2017  . IR US GUIDE VASC ACCESS RIGHT  03/25/2017  . IR US GUIDE VASC ACCESS RIGHT  01/20/2019  . IR VENIPUNCTURE 62YRS/OLDER BY MD  01/20/2019  . LEFT HEART CATH AND CORONARY ANGIOGRAPHY N/A 01/07/2018   Procedure: LEFT HEART CATH AND CORONARY ANGIOGRAPHY;  Surgeon: Troy Sine, MD;  Location: [place] CV [facility];  Service: Cardiovascular;  Laterality: N/A;  . POSTERIOR VITRECTOMY AND MEMBRANE PEEL-LEFT EYE  09/28/2002  . POSTERIOR VITRECTOMY AND MEMBRANE PEEL-RIGHT EYE  03/16/2002  . RETINAL DETACHMENT SURGERY    .  TEE WITHOUT CARDIOVERSION N/A 01/14/2018   Procedure: TRANSESOPHAGEAL ECHOCARDIOGRAM (TEE);  Surgeon: Grace Isaac, MD;  Location: [place];  Service: Open Heart Surgery;  Laterality: N/A;    There were no vitals filed for this visit.    Subjective Assessment - 03/21/20 1007    Subjective Pt reports she is still using her can a lot. She additionally has difficulty with stairs. Pt continues to have difficulty with her gastroparesis, which puts her in the hopsital sometimes. She is here to resume therapy today. She has not done the exercises as much as she needs to because she feels like the exercises lying down aren't doing as much for her walking. Looking down for her  is hard with her past retinal detachment 15 years ago, which makes stairs challenging.    Pertinent History uncontrolled diabetes, vision impairments, HTN    Limitations Sitting;Lifting;Standing;Walking;House hold activities    How long can you sit comfortably? 30 minutes    How long can you stand comfortably? 1-1.5 hours    How long can you walk comfortably? 15 minutes    Patient Stated Goals Get rid of the cane!, improve ability to go up and down steps    Currently in Pain? No/denies              [facility] PT Assessment - 03/21/20 0001      Assessment   Medical Diagnosis R femur fx    Referring Provider (PT) Kurtis Bushman, MD    Onset Date/Surgical Date 10/14/19    Hand Dominance Right    Next MD Visit not scheduled    Prior Therapy here in May 2021      Balance Screen   Has the patient fallen in the past 6 months No    Has the patient had a decrease in activity level because of a fear of falling?  Yes    Is the patient reluctant to leave their home because of a fear of falling?  No      Home Environment   Living Environment Private residence    Living Arrangements Alone    Available Help at Discharge Family    Type of [place] to enter    Entrance Stairs-Number of Steps 1      Prior Function   Level of Independence Independent    Vocation On disability    Leisure shopping, walking, going to [employer]      Posture/Postural Control   Posture Comments increased anterior pelvic tilt in standing and supine, decreased hip ext B R>L      ROM / Strength   AROM / PROM / Strength PROM      PROM   PROM Assessment Site Hip;Knee    Right/Left Hip Right    Right Hip Flexion 90    Right Hip External Rotation  23    Right Hip Internal Rotation  21    Right/Left Knee Right    Right Knee Extension 7    Right Knee Flexion 105      Strength   Strength Assessment Site Hip;Knee    Right/Left Hip Right;Left    Right Hip Flexion 4/5    Right Hip Extension 3-/5     Right Hip External Rotation  3+/5    Right Hip Internal Rotation 3+/5    Right/Left Knee Right;Left    Right Knee Flexion 4-/5    Right Knee Extension 4/5    Left Knee Flexion 4+/5  Left Knee Extension 4+/5                      Objective measurements completed on examination: See above findings.               PT Education - 03/21/20 1145    Education Details Diagnosis, prognosis, POC, HEP    Person(s) Educated Patient    Methods Explanation;Demonstration;Tactile cues;Handout;Verbal cues    Comprehension Verbal cues required;Returned demonstration;Verbalized understanding;Tactile cues required            PT Short Term Goals - 03/21/20 1140      PT SHORT TERM GOAL #1   Title Pt will be independent with HEP    Time 2    Period Weeks    Status New    Target Date 04/04/20             PT Long Term Goals - 03/21/20 1140      PT LONG TERM GOAL #1   Title Pt will demonstrate ability to walk 1 lap around building with no AD    Time 10    Period Weeks    Status New    Target Date 05/30/20      PT LONG TERM GOAL #2   Title Pt will demonstrate TUG <12 sec with no AD    Time 10    Period Weeks    Status New    Target Date 05/30/20      PT LONG TERM GOAL #3   Title Pt will demonstrate neutral pelvis in standing for appropriate lumbopelvic rhythm, in order to improve balance and decrease fall risk.    Baseline anterior pelvic tilt    Time 10    Period Weeks    Status New    Target Date 05/30/20      PT LONG TERM GOAL #4   Title Pt will be able to walk around [employer] to complete grocery shopping with cart and no need to take a seated rest break.    Baseline only going to regular grocery store right now    Time 10    Period Weeks    Status New    Target Date 05/30/20      PT LONG TERM GOAL #5   Title Pt will be I and compliant with long term HEP + progressions for maintenance.    Time 10    Period Weeks    Status New    Target  Date 05/30/20                  Plan - 03/21/20 1135    Clinical Impression Statement Pt presents to PT again after a few months of not being here, secondary to issues and hospitalizations surrounding her gastroparesis. Pt continues to demo same impairments and limitations with prolonged standing, ambulation, stairs, balance, and strength. She uses hurry cane in L UE for all standing and gait activities, demonstrating forward flexed posture with anterior pelvic tilt. Educated pt on diagnosis, prognosis, POC, and importance of HEP with pt verbalizing understanding and consent. Pt will benefit from skilled PT 1-2x/week for 8-10 weeks (2x 1st 2 weeks, 1x/week following 6-8 weeks)    Personal Factors and Comorbidities Comorbidity 3+;Past/Current Experience;Fitness;Time since onset of injury/illness/exacerbation    Comorbidities diabetes, HTN, gastroparesis    Examination-Activity Limitations Bend;Carry;Lift;Stand;Stairs;Squat;Sit;Locomotion Level    Examination-Participation Restrictions Meal Prep;Cleaning;Community Activity;Shop;Laundry;Interpersonal Relationship    Stability/Clinical Decision Making Stable/Uncomplicated    Clinical Decision  Making Low    Rehab Potential Good    PT Treatment/Interventions ADLs/Self Care Home Management;Electrical Stimulation;Moist Heat;Iontophoresis 4mg /ml Dexamethasone;Cryotherapy;Gait training;Stair training;Functional mobility training;Therapeutic activities;Therapeutic exercise;Balance training;Neuromuscular re-education;Manual techniques;Patient/family education;Passive range of motion;Dry needling;Vasopneumatic Device    PT Next Visit Plan Upper Leg FOTO, Progress LE strengthening/flexibility, balance, manual/modalities as indicated    PT Home Exercise Plan [id number]: SLR, PPT, standing at counter: heel to toe raises, hip ABD, slow marching, mini squats    Consulted and Agree with Plan of Care Patient           Patient will benefit from skilled  therapeutic intervention in order to improve the following deficits and impairments:  Abnormal gait, Decreased range of motion, Difficulty walking, Decreased endurance, Increased muscle spasms, Decreased activity tolerance, Pain, Decreased balance, Hypomobility, Impaired flexibility, Improper body mechanics, Decreased mobility, Decreased strength, Postural dysfunction, Impaired vision/preception, Increased edema (BLE swelling secondary to pt not taking LASIX for a few days)  Visit Diagnosis: Localized edema  Pain in right hip  Muscle weakness (generalized)  Impaired functional mobility, balance, gait, and endurance     Problem List Patient Active Problem List   Diagnosis Date Noted  . Closed fracture of proximal end of femur, right, initial encounter ([place]) 05/09/2019  . CAD (coronary artery disease) 03/06/2019  . Acute encephalopathy 12/22/2018  . Diabetic ketoacidosis ([place]) 04/30/2018  . Gastroparesis 04/13/2018  . DKA (diabetic ketoacidosis) ([place]) 03/31/2018  . S/P CABG x 3 01/14/2018  . CAD (coronary artery disease), native coronary artery 01/11/2018  . Dehydration   . MDD (major depressive disorder), single episode, severe , no psychosis ([place])   . DKA (diabetic ketoacidoses) ([place]) 06/16/2017  . Tardive dyskinesia   . Type 1 diabetes mellitus with hyperlipidemia ([place]) 03/18/2016  . Noncompliance with treatment plan 03/13/2016  . Essential hypertension 01/24/2016  . HLD (hyperlipidemia)   . GERD (gastroesophageal reflux disease)   . AKI (acute kidney injury) ([place])   . Anemia, iron deficiency   . Vitamin B12 deficiency 08/16/2015  . Diabetic gastroparesis ([place])   . Diabetic neuropathy, type I diabetes mellitus ([place]) 05/18/2015  . Anxiety and depression 05/18/2015    Izell [place], PT, DPT 03/21/2020, 11:47 AM  [place]. [place], [place], [address] Phone: [phone]   Fax:  [phone]  Name: [name] MRN: [id number] Date of Birth: [birth year]

## 2020-03-25 DIAGNOSIS — R2243 Localized swelling, mass and lump, lower limb, bilateral: Secondary | ICD-10-CM | POA: Diagnosis not present

## 2020-03-25 DIAGNOSIS — K219 Gastro-esophageal reflux disease without esophagitis: Secondary | ICD-10-CM | POA: Diagnosis not present

## 2020-03-25 DIAGNOSIS — E872 Acidosis: Secondary | ICD-10-CM | POA: Diagnosis not present

## 2020-03-25 DIAGNOSIS — E1010 Type 1 diabetes mellitus with ketoacidosis without coma: Secondary | ICD-10-CM | POA: Diagnosis not present

## 2020-03-25 DIAGNOSIS — R231 Pallor: Secondary | ICD-10-CM | POA: Diagnosis not present

## 2020-03-25 DIAGNOSIS — I959 Hypotension, unspecified: Secondary | ICD-10-CM | POA: Diagnosis not present

## 2020-03-25 DIAGNOSIS — Z8349 Family history of other endocrine, nutritional and metabolic diseases: Secondary | ICD-10-CM | POA: Diagnosis not present

## 2020-03-25 DIAGNOSIS — E1165 Type 2 diabetes mellitus with hyperglycemia: Secondary | ICD-10-CM | POA: Diagnosis not present

## 2020-03-25 DIAGNOSIS — E785 Hyperlipidemia, unspecified: Secondary | ICD-10-CM | POA: Diagnosis not present

## 2020-03-25 DIAGNOSIS — Z20822 Contact with and (suspected) exposure to covid-19: Secondary | ICD-10-CM | POA: Diagnosis not present

## 2020-03-25 DIAGNOSIS — Z9981 Dependence on supplemental oxygen: Secondary | ICD-10-CM | POA: Diagnosis not present

## 2020-03-25 DIAGNOSIS — R0689 Other abnormalities of breathing: Secondary | ICD-10-CM | POA: Diagnosis not present

## 2020-03-25 DIAGNOSIS — R0603 Acute respiratory distress: Secondary | ICD-10-CM | POA: Diagnosis not present

## 2020-03-25 DIAGNOSIS — N179 Acute kidney failure, unspecified: Secondary | ICD-10-CM | POA: Diagnosis not present

## 2020-03-25 DIAGNOSIS — K3184 Gastroparesis: Secondary | ICD-10-CM | POA: Diagnosis not present

## 2020-03-25 DIAGNOSIS — E1021 Type 1 diabetes mellitus with diabetic nephropathy: Secondary | ICD-10-CM | POA: Diagnosis not present

## 2020-03-25 DIAGNOSIS — G9341 Metabolic encephalopathy: Secondary | ICD-10-CM | POA: Diagnosis not present

## 2020-03-25 DIAGNOSIS — E1043 Type 1 diabetes mellitus with diabetic autonomic (poly)neuropathy: Secondary | ICD-10-CM | POA: Diagnosis not present

## 2020-03-25 DIAGNOSIS — R600 Localized edema: Secondary | ICD-10-CM | POA: Diagnosis not present

## 2020-03-25 DIAGNOSIS — D509 Iron deficiency anemia, unspecified: Secondary | ICD-10-CM | POA: Diagnosis not present

## 2020-03-25 DIAGNOSIS — R112 Nausea with vomiting, unspecified: Secondary | ICD-10-CM | POA: Diagnosis not present

## 2020-03-25 DIAGNOSIS — R0602 Shortness of breath: Secondary | ICD-10-CM | POA: Diagnosis not present

## 2020-03-25 DIAGNOSIS — R000 Tachycardia, unspecified: Secondary | ICD-10-CM | POA: Diagnosis not present

## 2020-03-25 DIAGNOSIS — F411 Generalized anxiety disorder: Secondary | ICD-10-CM | POA: Diagnosis not present

## 2020-03-25 DIAGNOSIS — I10 Essential (primary) hypertension: Secondary | ICD-10-CM | POA: Diagnosis not present

## 2020-03-26 DIAGNOSIS — R2243 Localized swelling, mass and lump, lower limb, bilateral: Secondary | ICD-10-CM | POA: Diagnosis not present

## 2020-03-26 DIAGNOSIS — K3184 Gastroparesis: Secondary | ICD-10-CM | POA: Diagnosis not present

## 2020-03-26 DIAGNOSIS — E1021 Type 1 diabetes mellitus with diabetic nephropathy: Secondary | ICD-10-CM | POA: Diagnosis not present

## 2020-03-26 DIAGNOSIS — I10 Essential (primary) hypertension: Secondary | ICD-10-CM | POA: Diagnosis not present

## 2020-03-26 DIAGNOSIS — R600 Localized edema: Secondary | ICD-10-CM | POA: Diagnosis not present

## 2020-03-26 DIAGNOSIS — N179 Acute kidney failure, unspecified: Secondary | ICD-10-CM | POA: Diagnosis not present

## 2020-03-26 DIAGNOSIS — E1010 Type 1 diabetes mellitus with ketoacidosis without coma: Secondary | ICD-10-CM | POA: Diagnosis not present

## 2020-03-26 DIAGNOSIS — E872 Acidosis: Secondary | ICD-10-CM | POA: Diagnosis not present

## 2020-03-26 DIAGNOSIS — R000 Tachycardia, unspecified: Secondary | ICD-10-CM | POA: Diagnosis not present

## 2020-03-27 DIAGNOSIS — Z9981 Dependence on supplemental oxygen: Secondary | ICD-10-CM | POA: Diagnosis not present

## 2020-03-27 DIAGNOSIS — I10 Essential (primary) hypertension: Secondary | ICD-10-CM | POA: Diagnosis not present

## 2020-03-27 DIAGNOSIS — E872 Acidosis: Secondary | ICD-10-CM | POA: Diagnosis not present

## 2020-03-27 DIAGNOSIS — N179 Acute kidney failure, unspecified: Secondary | ICD-10-CM | POA: Diagnosis not present

## 2020-03-27 DIAGNOSIS — R0603 Acute respiratory distress: Secondary | ICD-10-CM | POA: Diagnosis not present

## 2020-03-27 DIAGNOSIS — E1021 Type 1 diabetes mellitus with diabetic nephropathy: Secondary | ICD-10-CM | POA: Diagnosis not present

## 2020-03-27 DIAGNOSIS — E1010 Type 1 diabetes mellitus with ketoacidosis without coma: Secondary | ICD-10-CM | POA: Diagnosis not present

## 2020-03-28 ENCOUNTER — Encounter: Payer: Medicare HMO | Admitting: Physical Therapy

## 2020-03-28 DIAGNOSIS — E785 Hyperlipidemia, unspecified: Secondary | ICD-10-CM | POA: Diagnosis not present

## 2020-03-28 DIAGNOSIS — K3184 Gastroparesis: Secondary | ICD-10-CM | POA: Diagnosis not present

## 2020-03-28 DIAGNOSIS — K219 Gastro-esophageal reflux disease without esophagitis: Secondary | ICD-10-CM | POA: Diagnosis not present

## 2020-03-28 DIAGNOSIS — I10 Essential (primary) hypertension: Secondary | ICD-10-CM | POA: Diagnosis not present

## 2020-03-29 ENCOUNTER — Ambulatory Visit: Payer: Medicare HMO | Admitting: Physical Therapy

## 2020-03-29 DIAGNOSIS — K3184 Gastroparesis: Secondary | ICD-10-CM | POA: Diagnosis not present

## 2020-03-29 DIAGNOSIS — F411 Generalized anxiety disorder: Secondary | ICD-10-CM | POA: Diagnosis not present

## 2020-03-29 DIAGNOSIS — I10 Essential (primary) hypertension: Secondary | ICD-10-CM | POA: Diagnosis not present

## 2020-03-29 DIAGNOSIS — E1021 Type 1 diabetes mellitus with diabetic nephropathy: Secondary | ICD-10-CM | POA: Diagnosis not present

## 2020-03-31 ENCOUNTER — Ambulatory Visit: Payer: Medicare HMO | Attending: Orthopedic Surgery | Admitting: Physical Therapy

## 2020-03-31 ENCOUNTER — Encounter: Payer: Self-pay | Admitting: Physical Therapy

## 2020-03-31 ENCOUNTER — Other Ambulatory Visit: Payer: Self-pay

## 2020-03-31 ENCOUNTER — Encounter: Payer: Medicare HMO | Admitting: Physical Therapy

## 2020-03-31 DIAGNOSIS — R600 Localized edema: Secondary | ICD-10-CM | POA: Diagnosis not present

## 2020-03-31 DIAGNOSIS — M6281 Muscle weakness (generalized): Secondary | ICD-10-CM | POA: Diagnosis not present

## 2020-03-31 DIAGNOSIS — M25551 Pain in right hip: Secondary | ICD-10-CM

## 2020-03-31 DIAGNOSIS — Z7409 Other reduced mobility: Secondary | ICD-10-CM | POA: Diagnosis not present

## 2020-03-31 NOTE — Therapy (Signed)
[place]. [place], [place], [address] Phone: [phone]   Fax:  [phone]  Physical Therapy Treatment  Patient Details  Name: [name] MRN: [id number] Date of Birth: Sep 09, 1965 Referring Provider (PT): Kurtis Bushman, MD   Encounter Date: 03/31/2020   PT End of Session - 03/31/20 1056    Visit Number 2    Number of Visits 12    Date for PT Re-Evaluation 05/30/20    Authorization Type [employer] Medicare    PT Start Time 9381    PT Stop Time 1100    PT Time Calculation (min) 45 min    Activity Tolerance Patient tolerated treatment well    Behavior During Therapy [facility] for tasks assessed/performed           Past Medical History:  Diagnosis Date  . AKI (acute kidney injury) ([place])   . Anemia, iron deficiency   . Anxiety and depression 05/18/2015  . CAD (coronary artery disease), native coronary artery 01/11/2018   s/p CABG - Non-STEMI with severe three-vessel disease noted July 2019  . Depression   . Diabetic gastroparesis ([place])   . Diabetic neuropathy, type I diabetes mellitus ([place]) 05/18/2015  . DKA, type 1 ([place]) 03/18/2016  . Essential hypertension   . Gastroparesis   . GERD (gastroesophageal reflux disease)   . HLD (hyperlipidemia)   . MDD (major depressive disorder), single episode, severe , no psychosis ([place])   . Tardive dyskinesia   . Vitamin B12 deficiency 08/16/2015    Past Surgical History:  Procedure Laterality Date  . CARDIAC SURGERY    . COLONOSCOPY  09/27/2014   at [facility]  . CORONARY ARTERY BYPASS GRAFT N/A 01/14/2018   Procedure: CORONARY ARTERY BYPASS GRAFTING (CABG)X3, RIGHT AND LEFT SAPHENOUS VEIN HARVEST, MAMMARY TAKE DOWN. MAMMARY TO LAD, SVG TO PD, SVG TO DISTAL CIRC.;  Surgeon: Grace Isaac, MD;  Location: [place];  Service: Open Heart Surgery;  Laterality: N/A;  . ESOPHAGOGASTRODUODENOSCOPY  09/27/2014   at [facility], Dr Rolan Lipa. biospy neg for celiac, neg for H pylori.    . EYE SURGERY    . gailstones    . INTRAMEDULLARY (IM) NAIL INTERTROCHANTERIC Right 05/10/2019   Procedure: INTRAMEDULLARY (IM) NAIL INTERTROCHANTRIC;  Surgeon: Lovell Sheehan, MD;  Location: [facility];  Service: Orthopedics;  Laterality: Right;  . IR FLUORO GUIDE CV LINE RIGHT  02/01/2017  . IR FLUORO GUIDE CV LINE RIGHT  03/06/2017  . IR FLUORO GUIDE CV LINE RIGHT  03/25/2017  . IR GENERIC HISTORICAL  01/24/2016   IR FLUORO GUIDE CV LINE RIGHT 01/24/2016 Darrell K Allred, PA-C [facility]  . IR GENERIC HISTORICAL  01/24/2016   IR US GUIDE VASC ACCESS RIGHT 01/24/2016 Darrell K Allred, PA-C [facility]  . IR US GUIDE VASC ACCESS RIGHT  02/01/2017  . IR US GUIDE VASC ACCESS RIGHT  03/06/2017  . IR US GUIDE VASC ACCESS RIGHT  03/25/2017  . IR US GUIDE VASC ACCESS RIGHT  01/20/2019  . IR VENIPUNCTURE 58YRS/OLDER BY MD  01/20/2019  . LEFT HEART CATH AND CORONARY ANGIOGRAPHY N/A 01/07/2018   Procedure: LEFT HEART CATH AND CORONARY ANGIOGRAPHY;  Surgeon: Troy Sine, MD;  Location: [place] CV [facility];  Service: Cardiovascular;  Laterality: N/A;  . POSTERIOR VITRECTOMY AND MEMBRANE PEEL-LEFT EYE  09/28/2002  . POSTERIOR VITRECTOMY AND MEMBRANE PEEL-RIGHT EYE  03/16/2002  . RETINAL DETACHMENT SURGERY    . TEE WITHOUT CARDIOVERSION N/A 01/14/2018   Procedure:  TRANSESOPHAGEAL ECHOCARDIOGRAM (TEE);  Surgeon: Grace Isaac, MD;  Location: [place];  Service: Open Heart Surgery;  Laterality: N/A;    There were no vitals filed for this visit.   Subjective Assessment - 03/31/20 1017    Subjective "I feel ok" "Leg is feeling fine"    Currently in Pain? No/denies                             [facility] Adult PT Treatment/Exercise - 03/31/20 0001      Ambulation/Gait   Gait Comments Around inside of clinic without AD 16ft x2      High Level Balance   High Level Balance Activities Side stepping    High Level Balance Comments HHA x1 and some without       Knee/Hip Exercises: Aerobic    Nustep L5 x 6 min LE only      Knee/Hip Exercises: Machines for Strengthening   Cybex Knee Flexion 15# 2x10 BLE, 5# 1x10 RLE    Cybex Leg Press 20# 2x10 BLE      Knee/Hip Exercises: Standing   Other Standing Knee Exercises Alt 4in toe taps 2x5 each SPC       Knee/Hip Exercises: Seated   Long Arc Quad Right;2 sets;10 reps    Long Arc Quad Limitations 2    Hamstring Curl Both;2 sets;10 reps;Strengthening    Hamstring Limitations red tband    Sit to [employer] 2 sets;5 reps;without UE support                    PT Short Term Goals - 03/21/20 1140      PT SHORT TERM GOAL #1   Title Pt will be independent with HEP    Time 2    Period Weeks    Status New    Target Date 04/04/20             PT Long Term Goals - 03/21/20 1140      PT LONG TERM GOAL #1   Title Pt will demonstrate ability to walk 1 lap around building with no AD    Time 10    Period Weeks    Status New    Target Date 05/30/20      PT LONG TERM GOAL #2   Title Pt will demonstrate TUG <12 sec with no AD    Time 10    Period Weeks    Status New    Target Date 05/30/20      PT LONG TERM GOAL #3   Title Pt will demonstrate neutral pelvis in standing for appropriate lumbopelvic rhythm, in order to improve balance and decrease fall risk.    Baseline anterior pelvic tilt    Time 10    Period Weeks    Status New    Target Date 05/30/20      PT LONG TERM GOAL #4   Title Pt will be able to walk around [employer] to complete grocery shopping with cart and no need to take a seated rest break.    Baseline only going to regular grocery store right now    Time 10    Period Weeks    Status New    Target Date 05/30/20      PT LONG TERM GOAL #5   Title Pt will be I and compliant with long term HEP + progressions for maintenance.    Time 10  Period Weeks    Status New    Target Date 05/30/20                 Plan - 03/31/20 1057    Clinical Impression Statement Pt tolerated an initial  progression to Te well, No reports of increase pain throughout session. Encouragement needed for sides steps without Assist. Gait speed was slow with inconsistent step length. No issues with strengthening interventions.    Personal Factors and Comorbidities Comorbidity 3+;Past/Current Experience;Fitness;Time since onset of injury/illness/exacerbation    Comorbidities diabetes, HTN, gastroparesis    Examination-Activity Limitations Bend;Carry;Lift;Stand;Stairs;Squat;Sit;Locomotion Level    Examination-Participation Restrictions Meal Prep;Cleaning;Community Activity;Shop;Laundry;Interpersonal Relationship    Stability/Clinical Decision Making Stable/Uncomplicated    Rehab Potential Good    PT Frequency 2x / week    PT Duration 8 weeks    PT Next Visit Plan Upper Leg FOTO, Progress LE strengthening/flexibility, balance, manual/modalities as indicated           Patient will benefit from skilled therapeutic intervention in order to improve the following deficits and impairments:  Abnormal gait, Decreased range of motion, Difficulty walking, Decreased endurance, Increased muscle spasms, Decreased activity tolerance, Pain, Decreased balance, Hypomobility, Impaired flexibility, Improper body mechanics, Decreased mobility, Decreased strength, Postural dysfunction, Impaired vision/preception, Increased edema  Visit Diagnosis: Pain in right hip  Muscle weakness (generalized)  Impaired functional mobility, balance, gait, and endurance  Localized edema     Problem List Patient Active Problem List   Diagnosis Date Noted  . Closed fracture of proximal end of femur, right, initial encounter ([place]) 05/09/2019  . CAD (coronary artery disease) 03/06/2019  . Acute encephalopathy 12/22/2018  . Diabetic ketoacidosis ([place]) 04/30/2018  . Gastroparesis 04/13/2018  . DKA (diabetic ketoacidosis) ([place]) 03/31/2018  . S/P CABG x 3 01/14/2018  . CAD (coronary artery disease), native coronary artery 01/11/2018   . Dehydration   . MDD (major depressive disorder), single episode, severe , no psychosis ([place])   . DKA (diabetic ketoacidoses) 06/16/2017  . Tardive dyskinesia   . Type 1 diabetes mellitus with hyperlipidemia ([place]) 03/18/2016  . Noncompliance with treatment plan 03/13/2016  . Essential hypertension 01/24/2016  . HLD (hyperlipidemia)   . GERD (gastroesophageal reflux disease)   . AKI (acute kidney injury) ([place])   . Anemia, iron deficiency   . Vitamin B12 deficiency 08/16/2015  . Diabetic gastroparesis ([place])   . Diabetic neuropathy, type I diabetes mellitus ([place]) 05/18/2015  . Anxiety and depression 05/18/2015    Scot Jun 03/31/2020, 10:59 AM  [place]. [place], [place], [address] Phone: [phone]   Fax:  [phone]  Name: [name] MRN: [id number] Date of Birth: 02-Mar-1966

## 2020-04-01 ENCOUNTER — Other Ambulatory Visit: Payer: Self-pay

## 2020-04-01 NOTE — Patient Outreach (Signed)
[facility]) Care Management  04/01/2020  [name] [birth year] [id number]   Referral Date:03/10/20 Referral Source:[employer] report Date of Discharge:03/08/20 Facility:[facility] Insurance:Hum[employer]  Outreach attempt:No answer. Mail box full.    Plan: RN CM will close case at this time.    [name], RN, MSN [facility] Care Management Coordinator Direct Line [phone] Cell [phone] Toll Free: [phone]  Fax: [phone]

## 2020-04-04 ENCOUNTER — Ambulatory Visit: Payer: Medicare HMO | Admitting: Physical Therapy

## 2020-04-04 ENCOUNTER — Encounter: Payer: Medicare HMO | Admitting: Physical Therapy

## 2020-04-05 ENCOUNTER — Encounter: Payer: Medicare HMO | Admitting: Physical Therapy

## 2020-04-06 ENCOUNTER — Ambulatory Visit: Payer: Medicare HMO | Admitting: Physical Therapy

## 2020-04-11 ENCOUNTER — Ambulatory Visit: Payer: Medicare HMO | Admitting: Physical Therapy

## 2020-04-11 ENCOUNTER — Other Ambulatory Visit: Payer: Self-pay

## 2020-04-11 ENCOUNTER — Encounter: Payer: Self-pay | Admitting: Physical Therapy

## 2020-04-11 DIAGNOSIS — Z7409 Other reduced mobility: Secondary | ICD-10-CM | POA: Diagnosis not present

## 2020-04-11 DIAGNOSIS — M25551 Pain in right hip: Secondary | ICD-10-CM

## 2020-04-11 DIAGNOSIS — M6281 Muscle weakness (generalized): Secondary | ICD-10-CM

## 2020-04-11 DIAGNOSIS — R600 Localized edema: Secondary | ICD-10-CM | POA: Diagnosis not present

## 2020-04-11 NOTE — Therapy (Signed)
[address] [place], [place], [address] Phone: [phone]   Fax:  [phone]  Physical Therapy Treatment  Patient Details  Name: [name] MRN: [id number] Date of Birth: Jul 27, 1965 Referring Provider (PT): Kurtis Bushman, MD   Encounter Date: 04/11/2020   PT End of Session - 04/11/20 1059    Visit Number 3    Number of Visits 12    Date for PT Re-Evaluation 05/30/20    Authorization Type [employer]    PT Start Time 1245    PT Stop Time 1058    PT Time Calculation (min) 43 min    Activity Tolerance Patient tolerated treatment well    Behavior During Therapy [facility] for tasks assessed/performed           Past Medical History:  Diagnosis Date  . AKI (acute kidney injury) ([place])   . Anemia, iron deficiency   . Anxiety and depression 05/18/2015  . CAD (coronary artery disease), native coronary artery 01/11/2018   s/p CABG - Non-STEMI with severe three-vessel disease noted July 2019  . Depression   . Diabetic gastroparesis ([place])   . Diabetic neuropathy, type I diabetes mellitus ([place]) 05/18/2015  . DKA, type 1 ([place]) 03/18/2016  . Essential hypertension   . Gastroparesis   . GERD (gastroesophageal reflux disease)   . HLD (hyperlipidemia)   . MDD (major depressive disorder), single episode, severe , no psychosis ([place])   . Tardive dyskinesia   . Vitamin B12 deficiency 08/16/2015    Past Surgical History:  Procedure Laterality Date  . CARDIAC SURGERY    . COLONOSCOPY  09/27/2014   at [facility]  . CORONARY ARTERY BYPASS GRAFT N/A 01/14/2018   Procedure: CORONARY ARTERY BYPASS GRAFTING (CABG)X3, RIGHT AND LEFT SAPHENOUS VEIN HARVEST, MAMMARY TAKE DOWN. MAMMARY TO LAD, SVG TO PD, SVG TO DISTAL CIRC.;  Surgeon: Grace Isaac, MD;  Location: [place];  Service: Open Heart Surgery;  Laterality: N/A;  . ESOPHAGOGASTRODUODENOSCOPY  09/27/2014   at [facility], Dr Rolan Lipa. biospy neg for celiac, neg for H pylori.     . EYE SURGERY    . gailstones    . INTRAMEDULLARY (IM) NAIL INTERTROCHANTERIC Right 05/10/2019   Procedure: INTRAMEDULLARY (IM) NAIL INTERTROCHANTRIC;  Surgeon: Lovell Sheehan, MD;  Location: [facility];  Service: Orthopedics;  Laterality: Right;  . IR FLUORO GUIDE CV LINE RIGHT  02/01/2017  . IR FLUORO GUIDE CV LINE RIGHT  03/06/2017  . IR FLUORO GUIDE CV LINE RIGHT  03/25/2017  . IR GENERIC HISTORICAL  01/24/2016   IR FLUORO GUIDE CV LINE RIGHT 01/24/2016 Darrell K Allred, PA-C [facility]  . IR GENERIC HISTORICAL  01/24/2016   IR US GUIDE VASC ACCESS RIGHT 01/24/2016 Darrell K Allred, PA-C [facility]  . IR US GUIDE VASC ACCESS RIGHT  02/01/2017  . IR US GUIDE VASC ACCESS RIGHT  03/06/2017  . IR US GUIDE VASC ACCESS RIGHT  03/25/2017  . IR US GUIDE VASC ACCESS RIGHT  01/20/2019  . IR VENIPUNCTURE 5YRS/OLDER BY MD  01/20/2019  . LEFT HEART CATH AND CORONARY ANGIOGRAPHY N/A 01/07/2018   Procedure: LEFT HEART CATH AND CORONARY ANGIOGRAPHY;  Surgeon: Troy Sine, MD;  Location: [place] CV [facility];  Service: Cardiovascular;  Laterality: N/A;  . POSTERIOR VITRECTOMY AND MEMBRANE PEEL-LEFT EYE  09/28/2002  . POSTERIOR VITRECTOMY AND MEMBRANE PEEL-RIGHT EYE  03/16/2002  . RETINAL DETACHMENT SURGERY    . TEE WITHOUT CARDIOVERSION N/A 01/14/2018  Procedure: TRANSESOPHAGEAL ECHOCARDIOGRAM (TEE);  Surgeon: Grace Isaac, MD;  Location: [place];  Service: Open Heart Surgery;  Laterality: N/A;    There were no vitals filed for this visit.   Subjective Assessment - 04/11/20 1014    Subjective Pt reports feeling okay; wants to switch to 1x/week    Currently in Pain? No/denies                             [facility] Adult PT Treatment/Exercise - 04/11/20 0001      Ambulation/Gait   Gait Comments gait inside x1lap w/o SPC; outside up/down curbs supervision assist and no AD      Knee/Hip Exercises: Aerobic   Recumbent Bike L1 x 6 min    Nustep L5 x 6 min LE only      Knee/Hip Exercises:  Standing   Other Standing Knee Exercises Alt 4in toe taps 1x10 no HR CGA      Knee/Hip Exercises: Seated   Sit to Sand 2 sets;10 reps;without UE support   yellow ball chest press                   PT Short Term Goals - 03/21/20 1140      PT SHORT TERM GOAL #1   Title Pt will be independent with HEP    Time 2    Period Weeks    Status New    Target Date 04/04/20             PT Long Term Goals - 04/11/20 1046      PT LONG TERM GOAL #1   Title Pt will demonstrate ability to walk 1 lap around building with no AD    Baseline walks 1/2 lap around building w/o AD    Time 10    Period Weeks    Status Partially Met      PT LONG TERM GOAL #2   Title Pt will demonstrate TUG <12 sec with no AD    Baseline 17 sec with no AD; 17 sec with SPC    Time 10    Period Weeks    Status Partially Met      PT LONG TERM GOAL #3   Title Pt will demonstrate neutral pelvis in standing for appropriate lumbopelvic rhythm, in order to improve balance and decrease fall risk.    Baseline anterior pelvic tilt    Time 10    Period Weeks    Status Partially Met      PT LONG TERM GOAL #4   Title Pt will be able to walk around grocery store with cart and no need to take a seated rest break.    Baseline able to get groceries w/o sitting for rest break as long as she uses a cart    Time 10    Period Weeks    Status Partially Met      PT LONG TERM GOAL #5   Title Pt will be I and compliant with long term HEP + progressions for maintenance.    Time 10    Period Weeks    Status On-going                 Plan - 04/11/20 1059    Clinical Impression Statement Pt tolerated progression of TE well with no complaints of increased pain with exercise. Demos decreased stance time on RLE with gait w/o AD. Did well with curbs;  side step down curb and nervous at first with increasing confidence. Alt step taps no HR; supervision-CGA, pt hesitant but no LOB noted.    PT Treatment/Interventions  ADLs/Self Care Home Management;Electrical Stimulation;Moist Heat;Iontophoresis 33m/ml Dexamethasone;Cryotherapy;Gait training;Stair training;Functional mobility training;Therapeutic activities;Therapeutic exercise;Balance training;Neuromuscular re-education;Manual techniques;Patient/family education;Passive range of motion;Dry needling;Vasopneumatic Device    PT Next Visit Plan LE strength/flexibility, balance, endurance    Consulted and Agree with Plan of Care Patient           Patient will benefit from skilled therapeutic intervention in order to improve the following deficits and impairments:  Abnormal gait, Decreased range of motion, Difficulty walking, Decreased endurance, Increased muscle spasms, Decreased activity tolerance, Pain, Decreased balance, Hypomobility, Impaired flexibility, Improper body mechanics, Decreased mobility, Decreased strength, Postural dysfunction, Impaired vision/preception, Increased edema  Visit Diagnosis: Pain in right hip  Muscle weakness (generalized)  Impaired functional mobility, balance, gait, and endurance  Localized edema     Problem List Patient Active Problem List   Diagnosis Date Noted  . Closed fracture of proximal end of femur, right, initial encounter ([place] 05/09/2019  . CAD (coronary artery disease) 03/06/2019  . Acute encephalopathy 12/22/2018  . Diabetic ketoacidosis ([place] 04/30/2018  . Gastroparesis 04/13/2018  . DKA (diabetic ketoacidosis) ([place] 03/31/2018  . S/P CABG x 3 01/14/2018  . CAD (coronary artery disease), native coronary artery 01/11/2018  . Dehydration   . MDD (major depressive disorder), single episode, severe , no psychosis ([place]   . DKA (diabetic ketoacidoses) 06/16/2017  . Tardive dyskinesia   . Type 1 diabetes mellitus with hyperlipidemia ([place] 03/18/2016  . Noncompliance with treatment plan 03/13/2016  . Essential hypertension 01/24/2016  . HLD (hyperlipidemia)   . GERD (gastroesophageal reflux disease)   .  AKI (acute kidney injury) ([place]   . Anemia, iron deficiency   . Vitamin B12 deficiency 08/16/2015  . Diabetic gastroparesis ([place]   . Diabetic neuropathy, type I diabetes mellitus ([place] 05/18/2015  . Anxiety and depression 05/18/2015   [name] PT, DPT ADonald ProseSugg 04/11/2020, 11:06 AM  [place] [address]Phone: [phone]  Fax:  [phone] Name: CBrystal KildowMRN: [id number] of Birth: [birth year]

## 2020-04-13 ENCOUNTER — Ambulatory Visit: Payer: Medicare HMO | Admitting: Endocrinology

## 2020-04-18 ENCOUNTER — Encounter: Payer: Medicare HMO | Admitting: Physical Therapy

## 2020-04-18 DIAGNOSIS — L89892 Pressure ulcer of other site, stage 2: Secondary | ICD-10-CM | POA: Diagnosis not present

## 2020-04-18 DIAGNOSIS — E11621 Type 2 diabetes mellitus with foot ulcer: Secondary | ICD-10-CM | POA: Diagnosis not present

## 2020-04-18 DIAGNOSIS — B351 Tinea unguium: Secondary | ICD-10-CM | POA: Diagnosis not present

## 2020-04-18 DIAGNOSIS — M79672 Pain in left foot: Secondary | ICD-10-CM | POA: Diagnosis not present

## 2020-04-18 DIAGNOSIS — E119 Type 2 diabetes mellitus without complications: Secondary | ICD-10-CM | POA: Diagnosis not present

## 2020-04-18 DIAGNOSIS — M79671 Pain in right foot: Secondary | ICD-10-CM | POA: Diagnosis not present

## 2020-04-19 DIAGNOSIS — E1143 Type 2 diabetes mellitus with diabetic autonomic (poly)neuropathy: Secondary | ICD-10-CM | POA: Diagnosis not present

## 2020-04-19 DIAGNOSIS — K3184 Gastroparesis: Secondary | ICD-10-CM | POA: Diagnosis not present

## 2020-04-19 DIAGNOSIS — R6889 Other general symptoms and signs: Secondary | ICD-10-CM | POA: Diagnosis not present

## 2020-04-21 ENCOUNTER — Telehealth: Payer: Self-pay | Admitting: Cardiovascular Disease

## 2020-04-21 NOTE — Telephone Encounter (Signed)
Pt c/o Shortness Of Breath: STAT if SOB developed within the last 24 hours or pt is noticeably SOB on the phone  1. Are you currently SOB (can you hear that pt is SOB on the phone)? No  2. How long have you been experiencing SOB? Since 04/17/20  3. Are you SOB when sitting or when up moving around? Both, but mainly when up and moving around.  4. Are you currently experiencing any other symptoms? No

## 2020-04-21 NOTE — Telephone Encounter (Signed)
Reviewed with Dr Acie Fredrickson who recommends patient take lasix daily for 3 days and have office visit with him next week on November 4.  If symptoms worsen she should go to ED for evaluation. I spoke with patient and gave her these instructions. Appointment scheduled with Dr Acie Fredrickson for November 4,2021 at 11:40.  I asked patient to also schedule follow up with her PCP

## 2020-04-21 NOTE — Telephone Encounter (Signed)
I spoke with patient. She reports shortness of breath that started about 3 days ago.  Occurs at rest but worse with exertion such as walking through her house. Improves with rest. No chest pain. No cough, fever or recent covid exposure.  Does not weigh daily but reports weight at MD office earlier this week was 161 lbs which is stable for her. Always has swelling in feet and ankles. This has not increased.  Was hospitalized in early October in [place]. Patient reports she was not short of breath at that time but was having problems with gastroparesis causing her blood sugars to be high. She reports gastroparesis is better.  Blood sugars have been low at times and in upper 200's to 300's at other times.  Is seeing her endocrinologist on November 2.  Has not seen PCP since hospital discharge. She has appointment with Dr Acie Fredrickson on 05/13/20. Will forward to Dr Acie Fredrickson for review/recommendations.

## 2020-04-25 ENCOUNTER — Other Ambulatory Visit: Payer: Self-pay | Admitting: Endocrinology

## 2020-04-26 ENCOUNTER — Other Ambulatory Visit: Payer: Self-pay

## 2020-04-26 ENCOUNTER — Ambulatory Visit (INDEPENDENT_AMBULATORY_CARE_PROVIDER_SITE_OTHER): Payer: Medicare HMO | Admitting: Endocrinology

## 2020-04-26 ENCOUNTER — Other Ambulatory Visit: Payer: Self-pay | Admitting: *Deleted

## 2020-04-26 ENCOUNTER — Encounter: Payer: Self-pay | Admitting: Endocrinology

## 2020-04-26 VITALS — BP 120/70 | Ht 61.0 in | Wt 164.8 lb

## 2020-04-26 DIAGNOSIS — R6889 Other general symptoms and signs: Secondary | ICD-10-CM | POA: Diagnosis not present

## 2020-04-26 DIAGNOSIS — E1065 Type 1 diabetes mellitus with hyperglycemia: Secondary | ICD-10-CM

## 2020-04-26 DIAGNOSIS — Z23 Encounter for immunization: Secondary | ICD-10-CM

## 2020-04-26 DIAGNOSIS — E1069 Type 1 diabetes mellitus with other specified complication: Secondary | ICD-10-CM

## 2020-04-26 LAB — POCT GLYCOSYLATED HEMOGLOBIN (HGB A1C): Hemoglobin A1C: 6.9 % — AB (ref 4.0–5.6)

## 2020-04-26 MED ORDER — INSULIN ASPART 100 UNIT/ML ~~LOC~~ SOLN: SUBCUTANEOUS | 3 refills | Status: DC

## 2020-04-26 NOTE — Progress Notes (Signed)
Patient ID: [name], female   DOB: [birth year], 54 y.o.   MRN: [id number]           Reason for Appointment : Follow-up for Type 1 Diabetes  History of Present Illness          Diagnosis: Type 1 diabetes mellitus, date of diagnosis: 1977         Previous history:  She has mostly been followed by primary care physicians for her diabetes with consistently poor control She has had periodic episodes of ketoacidosis Previous insulin regimen:  BASAL insulin: LANTUS 15 units in a.m. MEALTIME insulin: Novolog variable doses: Usually 6-8 at breakfast, 2-3 at lunch and dinner   Recent history:   INSULIN regimen: OmniPod insulin pump since 07/07/2019  Basal rate: Midnight-5 AM = 0.90 units, 5 AM to 11 AM = 0.6, 11 AM-12 AM = 0.85 I/C ratio 1:1;   ISF: 40, target:120 with correction over 120   A1c is much improved at 6.9   Current management, blood sugar patterns and problems identified:    She has been using her Dexcom more regularly but difficult to get proper data because of conflicting dates programmed in her reader  Compared to her last visit her overall blood sugar control is generally better  However she has marked variability and no consistent patterns except higher readings before noon  She said that sometimes she is afraid to develop insulin to cover her meals and will not do correction boluses postprandially also  She appears to be overreacting to low normal or low sugars and eating food instead of using simple sugars, insulin (rebound  Overall hypoglycemia has been minimal, this morning early she has relatively low sugar without having bolused excessively late last night but was having persistently high readings yesterday  Blood sugar patterns and averages are discussed below in the interpretation   Glucose monitoring:  is being done 4+ times a day       CONTINUOUS GLUCOSE MONITORING RECORD INTERPRETATION    Dates of Recording: Last 2 weeks  Sensor description:  G6  Results statistics:   CGM use % of time  98  Average and SD  189, +/-70  Time in range  46     %  % Time Above 180  53  % Time above 250  18  % Time Below target  1.2     Glycemic patterns summary: Her Dexcom is now programmed to the right date and time but her needs to be manually adjusted She has significant variability of her blood sugars at all times On average HIGHEST blood sugars are around 11 AM and LOWEST around 5 PM   Hyperglycemic episodes occurred at variable times but most frequently overnight and midday Some of these are rebounding from low normal or low sugars  Hypoglycemic episodes occurred only rarely, did occur last night around 4-5 AM and occasionally 5-6 PM and usually transiently  Overnight periods: Blood sugars are significantly variable but on an average around 180 and somewhat lower around 6-7 AM  Preprandial periods: Breakfast: This is not until about 11 AM when blood sugar may range between 170-240 Blood sugars at dinnertime are averaging about 150 with significant variability including low normal sugars  Postprandial periods:   Has only occasional blood sugar spikes in the evenings after meals usually not after breakfast     PREVIOUS data:  PRE-MEAL Fasting Lunch Dinner Bedtime Overall  Glucose range:  51-250  157-435  104-435  192-246  Mean/median:  130  274  287   235   POST-MEAL PC Breakfast PC Lunch PC Dinner  Glucose range:   300, 386   Mean/median:       Symptoms of hypoglycemia: Weakness, sometimes none Treatment of hypoglycemia: Snacks         Self-care: The diet that the patient has been following is: None  Mealtimes are: Breakfast egg, cheese, sausage, occasionally grits, toast;   Lunch: Sandwich or only snacks, dinner: baked chricken, rice, potatoes                Dietician consultation: Most recent: years ago.         CDE consultation: 04/2018  Wt Readings from Last 3 Encounters:  04/26/20 164 lb 12.8 oz (74.8 kg)   03/01/20 156 lb 9.6 oz (71 kg)  02/15/20 159 lb 6.4 oz (72.3 kg)   Diabetes labs:  Lab Results  Component Value Date   HGBA1C 6.9 (A) 04/26/2020   HGBA1C 8.1 (A) 02/15/2020   HGBA1C 7.2 (A) 07/28/2019   Lab Results  Component Value Date   MICROALBUR 14.3 (H) 04/28/2018   LDLCALC 54 01/07/2018   CREATININE 1.33 (H) 05/15/2019    Lab Results  Component Value Date   MICRALBCREAT 17.1 04/28/2018   MICRALBCREAT 254 (H) 09/04/2016     Allergies as of 04/26/2020      Reactions   Anesthetics, Amide Nausea And Vomiting   Chlorhexidine    Penicillins Diarrhea, Nausea And Vomiting, Other (See Comments)   Has patient had a PCN reaction causing immediate rash, facial/tongue/throat swelling, SOB or lightheadedness with hypotension: Yes Has patient had a PCN reaction causing severe rash involving mucus membranes or skin necrosis: No Has patient had a PCN reaction that required hospitalization No Has patient had a PCN reaction occurring within the last 10 years: Yes  If all of the above answers are "NO", then may proceed with Cephalosporin use.   Buprenorphine Hcl Rash   Encainide Nausea And Vomiting   Metoclopramide Other (See Comments)   Currently no side effects, previously reportedly had dystonia, muscle rigidity, slurred speech, tongue swelling      Medication List       Accurate as of April 26, 2020  8:39 PM. If you have any questions, ask your nurse or doctor.        ammonium lactate 12 % cream Commonly known as: AMLACTIN Apply topically as needed for dry skin.   aspirin EC 81 MG tablet Take 1 tablet (81 mg total) by mouth daily.   atorvastatin 40 MG tablet Commonly known as: LIPITOR Take 1 tablet (40 mg total) by mouth daily.   busPIRone 10 MG tablet Commonly known as: BUSPAR Take 1 tablet (10 mg total) by mouth 3 (three) times daily.   clindamycin 300 MG capsule Commonly known as: CLEOCIN   Contour Test test strip Generic drug: glucose blood 1 each by  Other route 4 (four) times daily. E11.9   docusate sodium 100 MG capsule Commonly known as: Colace Take 1 capsule (100 mg total) by mouth daily as needed for mild constipation.   furosemide 40 MG tablet Commonly known as: LASIX Take 1 tablet (40 mg total) by mouth every other day.   HYDROcodone-acetaminophen 5-325 MG tablet Commonly known as: NORCO/VICODIN Take 1 tablet by mouth as needed.   insulin aspart 100 UNIT/ML injection Commonly known as: novoLOG Use max 100 units. USE IN INSULIN PUMP What changed: additional instructions Changed by: Tora Duck, Shannon  Insulin Syringe-Needle U-100 31G X 5/16" 0.5 ML Misc Commonly known as: BD Insulin Syringe Ultrafine 1 each by Does not apply route 4 (four) times daily.   loperamide 2 MG capsule Commonly known as: IMODIUM Take 1 capsule (2 mg total) by mouth 3 (three) times daily as needed for diarrhea or loose stools.   losartan 25 MG tablet Commonly known as: COZAAR Take 1 tablet (25 mg total) by mouth daily.   metoCLOPramide 10 MG tablet Commonly known as: REGLAN TAKE 1 TABLET(10 MG) BY MOUTH THREE TIMES DAILY BEFORE MEALS   metoprolol tartrate 25 MG tablet Commonly known as: LOPRESSOR Take 0.5 tablets (12.5 mg total) by mouth 2 (two) times daily.   nitrofurantoin (macrocrystal-monohydrate) 100 MG capsule Commonly known as: Macrobid Take 1 capsule (100 mg total) by mouth 2 (two) times daily.   [place] Kit by Does not apply route.   ondansetron 4 MG tablet Commonly known as: Zofran Take 1 tablet (4 mg total) by mouth every 8 (eight) hours as needed for nausea or vomiting.   pantoprazole 40 MG tablet Commonly known as: PROTONIX Take 1 tablet (40 mg total) by mouth daily.   polyethylene glycol 17 g packet Commonly known as: MIRALAX / GLYCOLAX Take 17 g by mouth 2 (two) times daily as needed for moderate constipation.   pregabalin 150 MG capsule Commonly known as: Lyrica Take 1 capsule (150 mg total)  by mouth 2 (two) times daily.   pyridostigmine 60 MG tablet Commonly known as: MESTINON Take 60 mg by mouth 3 (three) times daily.       Allergies:  Allergies  Allergen Reactions  . Anesthetics, Amide Nausea And Vomiting  . Chlorhexidine   . Penicillins Diarrhea, Nausea And Vomiting and Other (See Comments)    Has patient had a PCN reaction causing immediate rash, facial/tongue/throat swelling, SOB or lightheadedness with hypotension: Yes Has patient had a PCN reaction causing severe rash involving mucus membranes or skin necrosis: No Has patient had a PCN reaction that required hospitalization No Has patient had a PCN reaction occurring within the last 10 years: Yes  If all of the above answers are "NO", then may proceed with Cephalosporin use.   . Buprenorphine Hcl Rash  . Encainide Nausea And Vomiting  . Metoclopramide Other (See Comments)    Currently no side effects, previously reportedly had dystonia, muscle rigidity, slurred speech, tongue swelling    Past Medical History:  Diagnosis Date  . AKI (acute kidney injury) ([place])   . Anemia, iron deficiency   . Anxiety and depression 05/18/2015  . CAD (coronary artery disease), native coronary artery 01/11/2018   s/p CABG - Non-STEMI with severe three-vessel disease noted July 2019  . Depression   . Diabetic gastroparesis ([place])   . Diabetic neuropathy, type I diabetes mellitus ([place]) 05/18/2015  . DKA, type 1 ([place]) 03/18/2016  . Essential hypertension   . Gastroparesis   . GERD (gastroesophageal reflux disease)   . HLD (hyperlipidemia)   . MDD (major depressive disorder), single episode, severe , no psychosis ([place])   . Tardive dyskinesia   . Vitamin B12 deficiency 08/16/2015    Past Surgical History:  Procedure Laterality Date  . CARDIAC SURGERY    . COLONOSCOPY  09/27/2014   at [facility]  . CORONARY ARTERY BYPASS GRAFT N/A 01/14/2018   Procedure: CORONARY ARTERY BYPASS GRAFTING (CABG)X3, RIGHT AND LEFT SAPHENOUS VEIN  HARVEST, MAMMARY TAKE DOWN. MAMMARY TO LAD, SVG TO PD, SVG TO DISTAL CIRC.;  Surgeon:  Grace Isaac, MD;  Location: [place];  Service: Open Heart Surgery;  Laterality: N/A;  . ESOPHAGOGASTRODUODENOSCOPY  09/27/2014   at [facility], Dr Rolan Lipa. biospy neg for celiac, neg for H pylori.   . EYE SURGERY    . gailstones    . INTRAMEDULLARY (IM) NAIL INTERTROCHANTERIC Right 05/10/2019   Procedure: INTRAMEDULLARY (IM) NAIL INTERTROCHANTRIC;  Surgeon: Lovell Sheehan, MD;  Location: [facility];  Service: Orthopedics;  Laterality: Right;  . IR FLUORO GUIDE CV LINE RIGHT  02/01/2017  . IR FLUORO GUIDE CV LINE RIGHT  03/06/2017  . IR FLUORO GUIDE CV LINE RIGHT  03/25/2017  . IR GENERIC HISTORICAL  01/24/2016   IR FLUORO GUIDE CV LINE RIGHT 01/24/2016 Darrell K Allred, PA-C [facility]  . IR GENERIC HISTORICAL  01/24/2016   IR US GUIDE VASC ACCESS RIGHT 01/24/2016 Darrell K Allred, PA-C [facility]  . IR US GUIDE VASC ACCESS RIGHT  02/01/2017  . IR US GUIDE VASC ACCESS RIGHT  03/06/2017  . IR US GUIDE VASC ACCESS RIGHT  03/25/2017  . IR US GUIDE VASC ACCESS RIGHT  01/20/2019  . IR VENIPUNCTURE 19YRS/OLDER BY MD  01/20/2019  . LEFT HEART CATH AND CORONARY ANGIOGRAPHY N/A 01/07/2018   Procedure: LEFT HEART CATH AND CORONARY ANGIOGRAPHY;  Surgeon: Troy Sine, MD;  Location: [place] CV [facility];  Service: Cardiovascular;  Laterality: N/A;  . POSTERIOR VITRECTOMY AND MEMBRANE PEEL-LEFT EYE  09/28/2002  . POSTERIOR VITRECTOMY AND MEMBRANE PEEL-RIGHT EYE  03/16/2002  . RETINAL DETACHMENT SURGERY    . TEE WITHOUT CARDIOVERSION N/A 01/14/2018   Procedure: TRANSESOPHAGEAL ECHOCARDIOGRAM (TEE);  Surgeon: Grace Isaac, MD;  Location: [place];  Service: Open Heart Surgery;  Laterality: N/A;    Family History  Problem Relation Age of Onset  . Cystic fibrosis Mother   . Hypertension Father   . Diabetes Brother   . Hypertension Maternal Grandmother     Social History:  reports that she has never smoked. She has  never used smokeless tobacco. She reports current drug use. Drug: Marijuana. She reports that she does not drink alcohol.      Review of Systems      Lipids: She had been taking atorvastatin for history of CAD, now, prescribed by cardiologist but no recent labs available  Lab Results  Component Value Date   CHOL 121 01/07/2018   HDL 47 01/07/2018   LDLCALC 54 01/07/2018   TRIG 101 01/07/2018   CHOLHDL 2.6 01/07/2018     DIABETES COMPLICATIONS: Gastroparesis treated with Phenergan as needed Also now on Reglan regularly No recent nausea   On Lyrica 150 mg for peripheral neuropathy with pain, taking this long-term    Physical Examination:  BP 120/70   Ht 5' 1"  (1.549 m)   Wt 164 lb 12.8 oz (74.8 kg)   LMP  (Approximate)   BMI 31.14 kg/m       ASSESSMENT:  Diabetes type 1, long-standing with multiple complications  See history of present illness for detailed discussion of current diabetes management, blood sugar patterns and problems identified  Her A1c is better at 6.9  On an average her blood sugars are generally better than on her last visit about 2 months ago However still has an average of 189 and significant variability Although she likely needs higher basal rates during the night she has unexpected low sugars also occasionally and will continue her same settings until at least 6 AM at night Most likely will benefit  from significant increase in her basal rates between 6 AM and 12 noon which is only about 0.55 She can also benefit from adjusting her mealtime dose better when eating larger carbohydrate meals or any high fat meals especially in the evening Discussed correction of high readings which she is not always doing She is also not appropriately treating hypoglycemia causing significant rebound  LIPIDS: Will need follow-up in the next visit  Gastroparesis: Recently well controlled on Reglan  PLAN:   New basal rate settings: 12 AM = 0.95, 4 AM = 0.55,  6 AM = 0.75, 12 PM = 1.0 and 10 PM = 0.85 Basal rate settings were changed under supervision Information given to patient on how to change her basal rates Discussed proper treatment of hypoglycemia and using simple sugars, patient information flyer given She will try to bolus adequately when she is eating a full meal and not reduce the dose To take correction boluses if blood sugars are consistently high Although she may occasionally need temporary pacer settings for persistently high readings she did not understand how to do this She will need more advice and supervision on pump management with diabetes educator, referral made   There are no Patient Instructions on file for this visit.  Influenza vaccine given  Elayne Snare 04/26/2020, 8:38 PM     Note: This note was prepared with Dragon voice recognition system technology. Any transcriptional errors that result from this process are unintentional.

## 2020-04-27 ENCOUNTER — Encounter: Payer: Self-pay | Admitting: Cardiovascular Disease

## 2020-04-27 NOTE — Progress Notes (Signed)
Cardiology Office Note:    Date:  04/28/2020   ID:  [name], DOB 06/21/1966, MRN [id number]  PCP:  [name], MD  Cardiologist:  Mertie Moores, MD   Referring MD: [name], MD   Problem list 1.  Coronary artery disease-status post coronary artery bypass grafting 2.  Poorly controlled diabetes mellitus-complicated by noncompliance and neuropathy 3.  Hyperlipidemia  Chief Complaint  Patient presents with   Coronary Artery Disease   Hypertension     Sept. 10, 2019   [name] is a 54 y.o. female with a hx of poorly controlled diabetes mellitus and coronary artery disease.  She is status post coronary artery bypass grafting on January 14, 2018: She underwent CABG x 3 utilizing LIMA to LAD, SVG to PDA, and SVG to OM.  She is been to the emergency room over 50 times in the past year for diabetic ketoacidosis and gastroparesis. She was in the emergency room on September 1 with intractable nausea and vomiting.  She was not in DKA at that time.  Seen in the office for the first time today . Diabetes is under fairly good control currently   Has lots of issues with gastroparesis   Sept. 11, 2020  [name] is seen today for follow up visit Having issues with gastroparesis  No CP .    Nov. 4, 2021 [name] is seen for a work in visit for worsening dyspnea.  We had her take her furosemide for 3 day s straight which resolved her dyspnea.  Was previously taking the furosemide QOD .  Eats chicken pot pies several times a week .   Eats soup once very 2 weeks . Sausage in the AM,  Echo from Nov. 2020 shows normal LV systolic function ,   Was given script for atorvastatin ,  She did not take  Will change to rosuvastatin 10 mg a day    Past Medical History:  Diagnosis Date   AKI (acute kidney injury) ([place])    Anemia, iron deficiency    Anxiety and depression 05/18/2015   CAD (coronary artery disease), native coronary artery 01/11/2018   s/p CABG -  Non-STEMI with severe three-vessel disease noted July 2019   Depression    Diabetic gastroparesis ([facility])    Diabetic neuropathy, type I diabetes mellitus ([place]) 05/18/2015   DKA, type 1 ([place]) 03/18/2016   Essential hypertension    Gastroparesis    GERD (gastroesophageal reflux disease)    HLD (hyperlipidemia)    MDD (major depressive disorder), single episode, severe , no psychosis ([place])    Tardive dyskinesia    Vitamin B12 deficiency 08/16/2015    Past Surgical History:  Procedure Laterality Date   CARDIAC SURGERY     COLONOSCOPY  09/27/2014   at [facility] N/A 01/14/2018   Procedure: CORONARY ARTERY BYPASS GRAFTING (CABG)X3, RIGHT AND LEFT SAPHENOUS VEIN HARVEST, MAMMARY TAKE DOWN. MAMMARY TO LAD, SVG TO PD, SVG TO DISTAL CIRC.;  Surgeon: Grace Isaac, MD;  Location: [facility];  Service: Open Heart Surgery;  Laterality: N/A;   ESOPHAGOGASTRODUODENOSCOPY  09/27/2014   at [facility], Dr Rolan Lipa. biospy neg for celiac, neg for H pylori.    EYE SURGERY     gailstones     INTRAMEDULLARY (IM) NAIL INTERTROCHANTERIC Right 05/10/2019   Procedure: INTRAMEDULLARY (IM) NAIL INTERTROCHANTRIC;  Surgeon: Lovell Sheehan, MD;  Location: [facility];  Service: Orthopedics;  Laterality: Right;   IR FLUORO GUIDE CV LINE  RIGHT  02/01/2017   IR FLUORO GUIDE CV LINE RIGHT  03/06/2017   IR FLUORO GUIDE CV LINE RIGHT  03/25/2017   IR GENERIC HISTORICAL  01/24/2016   IR FLUORO GUIDE CV LINE RIGHT 01/24/2016 Darrell K Allred, PA-C [facility]   IR GENERIC HISTORICAL  01/24/2016   IR US GUIDE VASC ACCESS RIGHT 01/24/2016 Darrell K Allred, PA-C [facility]   IR US GUIDE VASC ACCESS RIGHT  02/01/2017   IR US GUIDE VASC ACCESS RIGHT  03/06/2017   IR US GUIDE VASC ACCESS RIGHT  03/25/2017   IR US GUIDE VASC ACCESS RIGHT  01/20/2019   IR VENIPUNCTURE 59YRS/OLDER BY MD  01/20/2019   LEFT HEART CATH AND CORONARY ANGIOGRAPHY N/A 01/07/2018   Procedure: LEFT HEART CATH AND  CORONARY ANGIOGRAPHY;  Surgeon: Troy Sine, MD;  Location: [facility];  Service: Cardiovascular;  Laterality: N/A;   POSTERIOR VITRECTOMY AND MEMBRANE PEEL-LEFT EYE  09/28/2002   POSTERIOR VITRECTOMY AND MEMBRANE PEEL-RIGHT EYE  03/16/2002   RETINAL DETACHMENT SURGERY     TEE WITHOUT CARDIOVERSION N/A 01/14/2018   Procedure: TRANSESOPHAGEAL ECHOCARDIOGRAM (TEE);  Surgeon: Grace Isaac, MD;  Location: [facility];  Service: Open Heart Surgery;  Laterality: N/A;    Current Medications: Current Meds  Medication Sig   aspirin EC 81 MG tablet Take 1 tablet (81 mg total) by mouth daily.   busPIRone (BUSPAR) 10 MG tablet Take 1 tablet (10 mg total) by mouth 3 (three) times daily.   docusate sodium (COLACE) 100 MG capsule Take 1 capsule (100 mg total) by mouth daily as needed for mild constipation.   dronabinol (MARINOL) 5 MG capsule Take 1 capsule by mouth 2 (two) times daily before a meal.   Ferrous Sulfate (IRON) 325 (65 Fe) MG TABS Take 1 tablet by mouth every other day.   furosemide (LASIX) 40 MG tablet Take 1 tablet (40 mg total) by mouth daily.   glucose blood (CONTOUR TEST) test strip 1 each by Other route 4 (four) times daily. E11.9   Insulin Disposable Pump (OMNIPOD DASH SYSTEM) KIT by Does not apply route.   Insulin Syringe-Needle U-100 (BD INSULIN SYRINGE ULTRAFINE) 31G X 5/16" 0.5 ML MISC 1 each by Does not apply route 4 (four) times daily.   loperamide (IMODIUM) 2 MG capsule Take 1 capsule (2 mg total) by mouth 3 (three) times daily as needed for diarrhea or loose stools.   losartan (COZAAR) 25 MG tablet Take 1 tablet (25 mg total) by mouth daily.   magnesium oxide (MAG-OX) 400 MG tablet Take 1 tablet by mouth in the morning and at bedtime.   metoCLOPramide (REGLAN) 10 MG tablet TAKE 1 TABLET(10 MG) BY MOUTH THREE TIMES DAILY BEFORE MEALS   metoprolol tartrate (LOPRESSOR) 25 MG tablet Take 0.5 tablets (12.5 mg total) by mouth 2 (two) times daily.    ondansetron (ZOFRAN) 4 MG tablet Take 1 tablet (4 mg total) by mouth every 8 (eight) hours as needed for nausea or vomiting.   pantoprazole (PROTONIX) 40 MG tablet Take 1 tablet (40 mg total) by mouth daily.   polyethylene glycol (MIRALAX / GLYCOLAX) 17 g packet Take 17 g by mouth 2 (two) times daily as needed for moderate constipation.   pregabalin (LYRICA) 150 MG capsule Take 1 capsule (150 mg total) by mouth 2 (two) times daily.   vitamin C (ASCORBIC ACID) 250 MG tablet Take 1 tablet by mouth every other day.   [DISCONTINUED] ammonium lactate (AMLACTIN) 12 % cream  Apply topically as needed for dry skin.   [DISCONTINUED] atorvastatin (LIPITOR) 40 MG tablet Take 1 tablet (40 mg total) by mouth daily.   [DISCONTINUED] clindamycin (CLEOCIN) 300 MG capsule    [DISCONTINUED] furosemide (LASIX) 40 MG tablet Take 1 tablet (40 mg total) by mouth every other day.   [DISCONTINUED] HYDROcodone-acetaminophen (NORCO/VICODIN) 5-325 MG tablet Take 1 tablet by mouth as needed.    [DISCONTINUED] insulin aspart (NOVOLOG) 100 UNIT/ML injection Use max 100 units. USE IN INSULIN PUMP   [DISCONTINUED] losartan (COZAAR) 25 MG tablet Take 1 tablet (25 mg total) by mouth daily.   [DISCONTINUED] nitrofurantoin, macrocrystal-monohydrate, (MACROBID) 100 MG capsule Take 1 capsule (100 mg total) by mouth 2 (two) times daily.   [DISCONTINUED] pyridostigmine (MESTINON) 60 MG tablet Take 60 mg by mouth 3 (three) times daily.    [DISCONTINUED] traMADol (ULTRAM) 50 MG tablet Take 1 tablet by mouth every 6 (six) hours as needed.     Allergies:   Anesthetics, amide; Chlorhexidine; Penicillins; Buprenorphine hcl; Encainide; and Metoclopramide   Social History   Socioeconomic History   Marital status: Single    Spouse name: Not on file   Number of children: 1   Years of education: Not on file   Highest education level: Not on file  Occupational History   Occupation: Unemployed  Tobacco Use   Smoking  status: Never Smoker   Smokeless tobacco: Never Used  Scientific laboratory technician Use: Never used  Substance and Sexual Activity   Alcohol use: No   Drug use: Yes    Types: Marijuana    Comment: occ   Sexual activity: Yes    Partners: Male    Birth control/protection: None  Other Topics Concern   Not on file  Social History Narrative   Not on file   Social Determinants of Health   Financial Resource Strain:    Difficulty of Paying Living Expenses: Not on file  Food Insecurity:    Worried About Charity fundraiser in the Last Year: Not on file   [employer] of Food in the Last Year: Not on file  Transportation Needs:    Lack of Transportation (Medical): Not on file   Lack of Transportation (Non-Medical): Not on file  Physical Activity:    Days of Exercise per Week: Not on file   Minutes of Exercise per Session: Not on file  Stress:    Feeling of Stress : Not on file  Social Connections:    Frequency of Communication with Friends and Family: Not on file   Frequency of Social Gatherings with Friends and Family: Not on file   Attends Religious Services: Not on file   Active Member of Clubs or Organizations: Not on file   Attends Archivist Meetings: Not on file   Marital Status: Not on file     Family History: The patient's family history includes Cystic fibrosis in her mother; Diabetes in her brother; Hypertension in her father and maternal grandmother.  ROS:   Please see the history of present illness.     All other systems reviewed and are negative.  EKGs/Labs/Other Studies Reviewed:    The following studies were reviewed today:   EKG:     Nov. 4, 2021:  NSR , NS ST / T wave abn.   Recent Labs: 05/09/2019: B Natriuretic Peptide 123.0 05/11/2019: ALT 16 05/15/2019: BUN 32; Creatinine, Ser 1.33; Hemoglobin 8.5; Platelets 356; Potassium 4.5; Sodium 136  Recent Lipid Panel  Component Value Date/Time   CHOL 121 01/07/2018 0510   TRIG 101  01/07/2018 0510   HDL 47 01/07/2018 0510   CHOLHDL 2.6 01/07/2018 0510   VLDL 20 01/07/2018 0510   LDLCALC 54 01/07/2018 0510     Physical Exam: Blood pressure (!) 130/58, pulse 71, height 5' 1"  (1.549 m), weight 166 lb 3.2 oz (75.4 kg), SpO2 98 %.  GEN:  Well nourished, well developed in no acute distress HEENT: Normal NECK: No JVD; No carotid bruits LYMPHATICS: No lymphadenopathy CARDIAC: RRR , no murmurs, rubs, gallops RESPIRATORY:  Clear to auscultation without rales, wheezing or rhonchi  ABDOMEN: Soft, non-tender, non-distended MUSCULOSKELETAL:  No edema; No deformity  SKIN: Warm and dry NEUROLOGIC:  Alert and oriented x 3    ASSESSMENT:    1. Coronary artery disease involving native coronary artery of native heart without angina pectoris   2. Hyperlipidemia, unspecified hyperlipidemia type   3. S/P CABG (coronary artery bypass graft)   4. Chronic systolic heart failure ([place])   5. Essential hypertension    PLAN:       1.  Coronary artery disease: no angina .    S/p CABG .    2. Shortness of breath:   She presents with some additional dyspnea.  Better with the daily lasix.  She eats a very salty diet .  We discussed her diet at great length.   Gave her DASH diet recommendations   3.  Leg edema :  Better after hight dose of lasix   4.  Diabetes:   Has diabetic sclerederma.   Encouraged her to work on better diabetic control   Medication Adjustments/Labs and Tests Ordered: Current medicines are reviewed at length with the patient today.  Concerns regarding medicines are outlined above.  Orders Placed This Encounter  Procedures   Lipid Profile   Basic Metabolic Panel (BMET)   Hepatic function panel   Basic Metabolic Panel (BMET)   EKG 12-Lead   Meds ordered this encounter  Medications   losartan (COZAAR) 25 MG tablet    Sig: Take 1 tablet (25 mg total) by mouth daily.    Dispense:  90 tablet    Refill:  3   furosemide (LASIX) 40 MG tablet     Sig: Take 1 tablet (40 mg total) by mouth daily.    Dispense:  90 tablet    Refill:  3   rosuvastatin (CRESTOR) 10 MG tablet    Sig: Take 1 tablet (10 mg total) by mouth daily.    Dispense:  90 tablet    Refill:  3    Patient Instructions  Medication Instructions:  Your physician has recommended you make the following change in your medication:  START Rosuvastatin (Crestor) 10 mg daily with heaviest meal INCREASE Furosemide (Lasix) to 40 mg daily  *If you need a refill on your cardiac medications before your next appointment, please call your pharmacy*   Lab Work: Your physician recommends that you return for lab work in: 3 weeks on Tuesday Nov. 23 - you may come in anytime between 7:30 am and 4:30 pm You do not have to FAST for this appointment  Your physician recommends that you return for lab work in: 3 months on Friday Feb. 4 anytime between 7:30 am and 4:30 pm You will need to FAST for this appointment - nothing to eat or drink after midnight the night before except water.   If you have labs (blood work) drawn today and your tests  are completely normal, you will receive your results only by:  MyChart Message (if you have MyChart) OR  A paper copy in the mail If you have any lab test that is abnormal or we need to change your treatment, we will call you to review the results.   Testing/Procedures: None Ordered   Follow-Up: At [facility], you and your health needs are our priority.  As part of our continuing mission to provide you with exceptional heart care, we have created designated Provider Care Teams.  These Care Teams include your primary Cardiologist (physician) and Advanced Practice Providers (APPs -  Physician Assistants and Nurse Practitioners) who all work together to provide you with the care you need, when you need it.  We recommend signing up for the patient portal called "MyChart".  Sign up information is provided on this After Visit Summary.  MyChart  is used to connect with patients for Virtual Visits (Telemedicine).  Patients are able to view lab/test results, encounter notes, upcoming appointments, etc.  Non-urgent messages can be sent to your provider as well.   To learn more about what you can do with MyChart, go to [email or web address].    Your next appointment:   3 month(s) on Tuesday Feb. 8 at 1;45 pm  The format for your next appointment:   In Person  Provider:   Richardson Dopp, PA-C   Other Instructions  [place] stands for "Dietary Approaches to Stop Hypertension." The DASH eating plan is a healthy eating plan that has been shown to reduce high blood pressure (hypertension). It may also reduce your risk for type 2 diabetes, heart disease, and stroke. The DASH eating plan may also help with weight loss. What are tips for following this plan?  General guidelines  Avoid eating more than 2,300 mg (milligrams) of salt (sodium) a day. If you have hypertension, you may need to reduce your sodium intake to 1,500 mg a day.  Limit alcohol intake to no more than 1 drink a day for nonpregnant women and 2 drinks a day for men. One drink equals 12 oz of beer, 5 oz of wine, or 1 oz of hard liquor.  Work with your health care provider to maintain a healthy body weight or to lose weight. Ask what an ideal weight is for you.  Get at least 30 minutes of exercise that causes your heart to beat faster (aerobic exercise) most days of the week. Activities may include walking, swimming, or biking.  Work with your health care provider or diet and nutrition specialist (dietitian) to adjust your eating plan to your individual calorie needs. Reading food labels   Check food labels for the amount of sodium per serving. Choose foods with less than 5 percent of the Daily Value of sodium. Generally, foods with less than 300 mg of sodium per serving fit into this eating plan.  To find whole grains, look for the word "whole" as the  first word in the ingredient list. Shopping  Buy products labeled as "low-sodium" or "no salt added."  Buy fresh foods. Avoid canned foods and premade or frozen meals. Cooking  Avoid adding salt when cooking. Use salt-free seasonings or herbs instead of table salt or sea salt. Check with your health care provider or pharmacist before using salt substitutes.  Do not fry foods. Cook foods using healthy methods such as baking, boiling, grilling, and broiling instead.  Cook with heart-healthy oils, such as olive, canola, soybean, or sunflower oil. Meal planning  Eat a balanced diet that includes: ? 5 or more servings of fruits and vegetables each day. At each meal, try to fill half of your plate with fruits and vegetables. ? Up to 6-8 servings of whole grains each day. ? Less than 6 oz of lean meat, poultry, or fish each day. A 3-oz serving of meat is about the same size as a deck of cards. One egg equals 1 oz. ? 2 servings of low-fat dairy each day. ? A serving of nuts, seeds, or beans 5 times each week. ? Heart-healthy fats. Healthy fats called Omega-3 fatty acids are found in foods such as flaxseeds and coldwater fish, like sardines, salmon, and mackerel.  Limit how much you eat of the following: ? Canned or prepackaged foods. ? Food that is high in trans fat, such as fried foods. ? Food that is high in saturated fat, such as fatty meat. ? Sweets, desserts, sugary drinks, and other foods with added sugar. ? Full-fat dairy products.  Do not salt foods before eating.  Try to eat at least 2 vegetarian meals each week.  Eat more home-cooked food and less restaurant, buffet, and fast food.  When eating at a restaurant, ask that your food be prepared with less salt or no salt, if possible. What foods are recommended? The items listed may not be a complete list. Talk with your dietitian about what dietary choices are best for you. Grains Whole-grain or whole-wheat bread. Whole-grain  or whole-wheat pasta. Brown rice. [place]. Bulgur. Whole-grain and low-sodium cereals. Pita bread. Low-fat, low-sodium crackers. Whole-wheat flour tortillas. Vegetables Fresh or frozen vegetables (raw, steamed, roasted, or grilled). Low-sodium or reduced-sodium tomato and vegetable juice. Low-sodium or reduced-sodium tomato sauce and tomato paste. Low-sodium or reduced-sodium canned vegetables. Fruits All fresh, dried, or frozen fruit. Canned fruit in natural juice (without added sugar). Meat and other protein foods Skinless chicken or [place]. Ground chicken or [place]. Pork with fat trimmed off. Fish and seafood. Egg whites. Dried beans, peas, or lentils. Unsalted nuts, nut butters, and seeds. Unsalted canned beans. Lean cuts of beef with fat trimmed off. Low-sodium, lean deli meat. Dairy Low-fat (1%) or fat-free (skim) milk. Fat-free, low-fat, or reduced-fat cheeses. Nonfat, low-sodium ricotta or cottage cheese. Low-fat or nonfat yogurt. Low-fat, low-sodium cheese. Fats and oils Soft margarine without trans fats. Vegetable oil. Low-fat, reduced-fat, or light mayonnaise and salad dressings (reduced-sodium). Canola, safflower, olive, soybean, and sunflower oils. Avocado. Seasoning and other foods Herbs. Spices. Seasoning mixes without salt. Unsalted popcorn and pretzels. Fat-free sweets. What foods are not recommended? The items listed may not be a complete list. Talk with your dietitian about what dietary choices are best for you. Grains Baked goods made with fat, such as croissants, muffins, or some breads. Dry pasta or rice meal packs. Vegetables Creamed or fried vegetables. Vegetables in a cheese sauce. Regular canned vegetables (not low-sodium or reduced-sodium). Regular canned tomato sauce and paste (not low-sodium or reduced-sodium). Regular tomato and vegetable juice (not low-sodium or reduced-sodium). Angie Fava. Olives. Fruits Canned fruit in a light or heavy syrup. Fried fruit.  Fruit in cream or butter sauce. Meat and other protein foods Fatty cuts of meat. Ribs. Fried meat. Berniece Salines. Sausage. Bologna and other processed lunch meats. Salami. Fatback. Hotdogs. Bratwurst. Salted nuts and seeds. Canned beans with added salt. Canned or smoked fish. Whole eggs or egg yolks. Chicken or [place] with skin. Dairy Whole or 2% milk, cream, and half-and-half. Whole or full-fat cream cheese. Whole-fat or sweetened yogurt.  Full-fat cheese. Nondairy creamers. Whipped toppings. Processed cheese and cheese spreads. Fats and oils Butter. Stick margarine. Lard. Shortening. Ghee. Bacon fat. Tropical oils, such as coconut, palm kernel, or palm oil. Seasoning and other foods Salted popcorn and pretzels. Onion salt, garlic salt, seasoned salt, table salt, and sea salt. Worcestershire sauce. Tartar sauce. Barbecue sauce. Teriyaki sauce. Soy sauce, including reduced-sodium. Steak sauce. Canned and packaged gravies. Fish sauce. Oyster sauce. Cocktail sauce. Horseradish that you find on the shelf. Ketchup. Mustard. Meat flavorings and tenderizers. Bouillon cubes. Hot sauce and Tabasco sauce. Premade or packaged marinades. Premade or packaged taco seasonings. Relishes. Regular salad dressings. Where to find more information:  National Heart, Lung, and [place]: https://wilson-eaton.com/  American Heart Association: www.heart.org Summary  The DASH eating plan is a healthy eating plan that has been shown to reduce high blood pressure (hypertension). It may also reduce your risk for type 2 diabetes, heart disease, and stroke.  With the DASH eating plan, you should limit salt (sodium) intake to 2,300 mg a day. If you have hypertension, you may need to reduce your sodium intake to 1,500 mg a day.  When on the DASH eating plan, aim to eat more fresh fruits and vegetables, whole grains, lean proteins, low-fat dairy, and heart-healthy fats.  Work with your health care provider or diet and nutrition  specialist (dietitian) to adjust your eating plan to your individual calorie needs. This information is not intended to replace advice given to you by your health care provider. Make sure you discuss any questions you have with your health care provider. Document Revised: 05/24/2017 Document Reviewed: 06/04/2016 Elsevier Patient Education  2020 [place], Mertie Moores, MD  04/28/2020 2:32 PM    [place]

## 2020-04-28 ENCOUNTER — Encounter: Payer: Self-pay | Admitting: Cardiovascular Disease

## 2020-04-28 ENCOUNTER — Other Ambulatory Visit: Payer: Self-pay

## 2020-04-28 ENCOUNTER — Ambulatory Visit: Payer: Medicare HMO | Admitting: Cardiovascular Disease

## 2020-04-28 VITALS — BP 130/58 | HR 71 | Ht 61.0 in | Wt 166.2 lb

## 2020-04-28 DIAGNOSIS — I5022 Chronic systolic (congestive) heart failure: Secondary | ICD-10-CM

## 2020-04-28 DIAGNOSIS — E785 Hyperlipidemia, unspecified: Secondary | ICD-10-CM

## 2020-04-28 DIAGNOSIS — I2510 Atherosclerotic heart disease of native coronary artery without angina pectoris: Secondary | ICD-10-CM | POA: Diagnosis not present

## 2020-04-28 DIAGNOSIS — Z951 Presence of aortocoronary bypass graft: Secondary | ICD-10-CM

## 2020-04-28 DIAGNOSIS — I10 Essential (primary) hypertension: Secondary | ICD-10-CM

## 2020-04-28 DIAGNOSIS — R6889 Other general symptoms and signs: Secondary | ICD-10-CM | POA: Diagnosis not present

## 2020-04-28 MED ORDER — FUROSEMIDE 40 MG PO TABS
40.0000 mg | ORAL_TABLET | Freq: Every day | ORAL | 3 refills | Status: DC
Start: 2020-04-28 — End: 2020-07-29

## 2020-04-28 MED ORDER — ROSUVASTATIN CALCIUM 10 MG PO TABS: 10.0000 mg | ORAL_TABLET | Freq: Every day | ORAL | 3 refills | Status: DC

## 2020-04-28 MED ORDER — LOSARTAN POTASSIUM 25 MG PO TABS: 25.0000 mg | ORAL_TABLET | Freq: Every day | ORAL | 3 refills | Status: DC

## 2020-04-28 NOTE — Patient Instructions (Signed)
Medication Instructions:  Your physician has recommended you make the following change in your medication:  START Rosuvastatin (Crestor) 10 mg daily with heaviest meal INCREASE Furosemide (Lasix) to 40 mg daily  *If you need a refill on your cardiac medications before your next appointment, please call your pharmacy*   Lab Work: Your physician recommends that you return for lab work in: 3 weeks on Tuesday Nov. 23 - you may come in anytime between 7:30 am and 4:30 pm You do not have to FAST for this appointment  Your physician recommends that you return for lab work in: 3 months on Friday Feb. 4 anytime between 7:30 am and 4:30 pm You will need to FAST for this appointment - nothing to eat or drink after midnight the night before except water.   If you have labs (blood work) drawn today and your tests are completely normal, you will receive your results only by: Marland Kitchen MyChart Message (if you have MyChart) OR . A paper copy in the mail If you have any lab test that is abnormal or we need to change your treatment, we will call you to review the results.   Testing/Procedures: None Ordered   Follow-Up: At [facility], you and your health needs are our priority.  As part of our continuing mission to provide you with exceptional heart care, we have created designated Provider Care Teams.  These Care Teams include your primary Cardiologist (physician) and Advanced Practice Providers (APPs -  Physician Assistants and Nurse Practitioners) who all work together to provide you with the care you need, when you need it.  We recommend signing up for the patient portal called "MyChart".  Sign up information is provided on this After Visit Summary.  MyChart is used to connect with patients for Virtual Visits (Telemedicine).  Patients are able to view lab/test results, encounter notes, upcoming appointments, etc.  Non-urgent messages can be sent to your provider as well.   To learn more about what you can  do with MyChart, go to [email or web address].    Your next appointment:   3 month(s) on Tuesday Feb. 8 at 1;45 pm  The format for your next appointment:   In Person  Provider:   Richardson Dopp, PA-C   Other Instructions  [place] stands for "Dietary Approaches to Stop Hypertension." The DASH eating plan is a healthy eating plan that has been shown to reduce high blood pressure (hypertension). It may also reduce your risk for type 2 diabetes, heart disease, and stroke. The DASH eating plan may also help with weight loss. What are tips for following this plan?  General guidelines  Avoid eating more than 2,300 mg (milligrams) of salt (sodium) a day. If you have hypertension, you may need to reduce your sodium intake to 1,500 mg a day.  Limit alcohol intake to no more than 1 drink a day for nonpregnant women and 2 drinks a day for men. One drink equals 12 oz of beer, 5 oz of wine, or 1 oz of hard liquor.  Work with your health care provider to maintain a healthy body weight or to lose weight. Ask what an ideal weight is for you.  Get at least 30 minutes of exercise that causes your heart to beat faster (aerobic exercise) most days of the week. Activities may include walking, swimming, or biking.  Work with your health care provider or diet and nutrition specialist (dietitian) to adjust your eating plan to your individual calorie needs. Reading  food labels   Check food labels for the amount of sodium per serving. Choose foods with less than 5 percent of the Daily Value of sodium. Generally, foods with less than 300 mg of sodium per serving fit into this eating plan.  To find whole grains, look for the word "whole" as the first word in the ingredient list. Shopping  Buy products labeled as "low-sodium" or "no salt added."  Buy fresh foods. Avoid canned foods and premade or frozen meals. Cooking  Avoid adding salt when cooking. Use salt-free seasonings or herbs  instead of table salt or sea salt. Check with your health care provider or pharmacist before using salt substitutes.  Do not fry foods. Cook foods using healthy methods such as baking, boiling, grilling, and broiling instead.  Cook with heart-healthy oils, such as olive, canola, soybean, or sunflower oil. Meal planning  Eat a balanced diet that includes: ? 5 or more servings of fruits and vegetables each day. At each meal, try to fill half of your plate with fruits and vegetables. ? Up to 6-8 servings of whole grains each day. ? Less than 6 oz of lean meat, poultry, or fish each day. A 3-oz serving of meat is about the same size as a deck of cards. One egg equals 1 oz. ? 2 servings of low-fat dairy each day. ? A serving of nuts, seeds, or beans 5 times each week. ? Heart-healthy fats. Healthy fats called Omega-3 fatty acids are found in foods such as flaxseeds and coldwater fish, like sardines, salmon, and mackerel.  Limit how much you eat of the following: ? Canned or prepackaged foods. ? Food that is high in trans fat, such as fried foods. ? Food that is high in saturated fat, such as fatty meat. ? Sweets, desserts, sugary drinks, and other foods with added sugar. ? Full-fat dairy products.  Do not salt foods before eating.  Try to eat at least 2 vegetarian meals each week.  Eat more home-cooked food and less restaurant, buffet, and fast food.  When eating at a restaurant, ask that your food be prepared with less salt or no salt, if possible. What foods are recommended? The items listed may not be a complete list. Talk with your dietitian about what dietary choices are best for you. Grains Whole-grain or whole-wheat bread. Whole-grain or whole-wheat pasta. Brown rice. Modena Morrow. Bulgur. Whole-grain and low-sodium cereals. Pita bread. Low-fat, low-sodium crackers. Whole-wheat flour tortillas. Vegetables Fresh or frozen vegetables (raw, steamed, roasted, or grilled).  Low-sodium or reduced-sodium tomato and vegetable juice. Low-sodium or reduced-sodium tomato sauce and tomato paste. Low-sodium or reduced-sodium canned vegetables. Fruits All fresh, dried, or frozen fruit. Canned fruit in natural juice (without added sugar). Meat and other protein foods Skinless chicken or [place]. Ground chicken or [place]. Pork with fat trimmed off. Fish and seafood. Egg whites. Dried beans, peas, or lentils. Unsalted nuts, nut butters, and seeds. Unsalted canned beans. Lean cuts of beef with fat trimmed off. Low-sodium, lean deli meat. Dairy Low-fat (1%) or fat-free (skim) milk. Fat-free, low-fat, or reduced-fat cheeses. Nonfat, low-sodium ricotta or cottage cheese. Low-fat or nonfat yogurt. Low-fat, low-sodium cheese. Fats and oils Soft margarine without trans fats. Vegetable oil. Low-fat, reduced-fat, or light mayonnaise and salad dressings (reduced-sodium). Canola, safflower, olive, soybean, and sunflower oils. Avocado. Seasoning and other foods Herbs. Spices. Seasoning mixes without salt. Unsalted popcorn and pretzels. Fat-free sweets. What foods are not recommended? The items listed may not be a complete list. Talk  with your dietitian about what dietary choices are best for you. Grains Baked goods made with fat, such as croissants, muffins, or some breads. Dry pasta or rice meal packs. Vegetables Creamed or fried vegetables. Vegetables in a cheese sauce. Regular canned vegetables (not low-sodium or reduced-sodium). Regular canned tomato sauce and paste (not low-sodium or reduced-sodium). Regular tomato and vegetable juice (not low-sodium or reduced-sodium). Angie Fava. Olives. Fruits Canned fruit in a light or heavy syrup. Fried fruit. Fruit in cream or butter sauce. Meat and other protein foods Fatty cuts of meat. Ribs. Fried meat. Berniece Salines. Sausage. Bologna and other processed lunch meats. Salami. Fatback. Hotdogs. Bratwurst. Salted nuts and seeds. Canned beans with added  salt. Canned or smoked fish. Whole eggs or egg yolks. Chicken or [place] with skin. Dairy Whole or 2% milk, cream, and half-and-half. Whole or full-fat cream cheese. Whole-fat or sweetened yogurt. Full-fat cheese. Nondairy creamers. Whipped toppings. Processed cheese and cheese spreads. Fats and oils Butter. Stick margarine. Lard. Shortening. Ghee. Bacon fat. Tropical oils, such as coconut, palm kernel, or palm oil. Seasoning and other foods Salted popcorn and pretzels. Onion salt, garlic salt, seasoned salt, table salt, and sea salt. Worcestershire sauce. Tartar sauce. Barbecue sauce. Teriyaki sauce. Soy sauce, including reduced-sodium. Steak sauce. Canned and packaged gravies. Fish sauce. Oyster sauce. Cocktail sauce. Horseradish that you find on the shelf. Ketchup. Mustard. Meat flavorings and tenderizers. Bouillon cubes. Hot sauce and Tabasco sauce. Premade or packaged marinades. Premade or packaged taco seasonings. Relishes. Regular salad dressings. Where to find more information:  National Heart, Lung, and [place]: https://wilson-eaton.com/  American Heart Association: www.heart.org Summary  The DASH eating plan is a healthy eating plan that has been shown to reduce high blood pressure (hypertension). It may also reduce your risk for type 2 diabetes, heart disease, and stroke.  With the DASH eating plan, you should limit salt (sodium) intake to 2,300 mg a day. If you have hypertension, you may need to reduce your sodium intake to 1,500 mg a day.  When on the DASH eating plan, aim to eat more fresh fruits and vegetables, whole grains, lean proteins, low-fat dairy, and heart-healthy fats.  Work with your health care provider or diet and nutrition specialist (dietitian) to adjust your eating plan to your individual calorie needs. This information is not intended to replace advice given to you by your health care provider. Make sure you discuss any questions you have with your health care  provider. Document Revised: 05/24/2017 Document Reviewed: 06/04/2016 Elsevier Patient Education  2020 [employer].

## 2020-04-29 ENCOUNTER — Other Ambulatory Visit: Payer: Self-pay | Admitting: *Deleted

## 2020-05-02 ENCOUNTER — Encounter: Payer: Self-pay | Admitting: Physical Therapy

## 2020-05-02 ENCOUNTER — Other Ambulatory Visit: Payer: Self-pay

## 2020-05-02 ENCOUNTER — Ambulatory Visit: Payer: Medicare HMO | Attending: Orthopedic Surgery | Admitting: Physical Therapy

## 2020-05-02 DIAGNOSIS — M6281 Muscle weakness (generalized): Secondary | ICD-10-CM | POA: Diagnosis not present

## 2020-05-02 DIAGNOSIS — R600 Localized edema: Secondary | ICD-10-CM | POA: Diagnosis not present

## 2020-05-02 DIAGNOSIS — Z7409 Other reduced mobility: Secondary | ICD-10-CM | POA: Insufficient documentation

## 2020-05-02 DIAGNOSIS — M25551 Pain in right hip: Secondary | ICD-10-CM | POA: Diagnosis not present

## 2020-05-02 NOTE — Therapy (Signed)
[address] [place], [place], [address] Phone: [phone]   Fax:  [phone]  Physical Therapy Treatment  Patient Details  Name: [name] MRN: [id number] Date of Birth: 04-07-1966 Referring Provider (PT): Kurtis Bushman, MD   Encounter Date: 05/02/2020   PT End of Session - 05/02/20 1139    Visit Number 4    Number of Visits 12    Date for PT Re-Evaluation 05/30/20    Authorization Type [employer]    PT Start Time 1058    PT Stop Time 1140    PT Time Calculation (min) 42 min    Activity Tolerance Patient tolerated treatment well    Behavior During Therapy [facility] for tasks assessed/performed           Past Medical History:  Diagnosis Date  . AKI (acute kidney injury) ([place])   . Anemia, iron deficiency   . Anxiety and depression 05/18/2015  . CAD (coronary artery disease), native coronary artery 01/11/2018   s/p CABG - Non-STEMI with severe three-vessel disease noted July 2019  . Depression   . Diabetic gastroparesis ([place])   . Diabetic neuropathy, type I diabetes mellitus ([place]) 05/18/2015  . DKA, type 1 ([place]) 03/18/2016  . Essential hypertension   . Gastroparesis   . GERD (gastroesophageal reflux disease)   . HLD (hyperlipidemia)   . MDD (major depressive disorder), single episode, severe , no psychosis ([place])   . Tardive dyskinesia   . Vitamin B12 deficiency 08/16/2015    Past Surgical History:  Procedure Laterality Date  . CARDIAC SURGERY    . COLONOSCOPY  09/27/2014   [facility]  . CORONARY ARTERY BYPASS GRAFT N/A 01/14/2018   Procedure: CORONARY ARTERY BYPASS GRAFTING (CABG)X3, RIGHT AND LEFT SAPHENOUS VEIN HARVEST, MAMMARY TAKE DOWN. MAMMARY TO LAD, SVG TO PD, SVG TO DISTAL CIRC.;  Surgeon: Grace Isaac, MD;  Location: [place];  Service: Open Heart Surgery;  Laterality: N/A;  . ESOPHAGOGASTRODUODENOSCOPY  09/27/2014   at [facility], Dr Rolan Lipa. biospy neg for celiac, neg for H pylori.     . EYE SURGERY    . gailstones    . INTRAMEDULLARY (IM) NAIL INTERTROCHANTERIC Right 05/10/2019   Procedure: INTRAMEDULLARY (IM) NAIL INTERTROCHANTRIC;  Surgeon: Lovell Sheehan, MD;  Location: [facility];  Service: Orthopedics;  Laterality: Right;  . IR FLUORO GUIDE CV LINE RIGHT  02/01/2017  . IR FLUORO GUIDE CV LINE RIGHT  03/06/2017  . IR FLUORO GUIDE CV LINE RIGHT  03/25/2017  . IR GENERIC HISTORICAL  01/24/2016   IR FLUORO GUIDE CV LINE RIGHT 01/24/2016 Darrell K Allred, PA-C [facility]  . IR GENERIC HISTORICAL  01/24/2016   IR US GUIDE VASC ACCESS RIGHT 01/24/2016 Darrell K Allred, PA-C [facility]  . IR US GUIDE VASC ACCESS RIGHT  02/01/2017  . IR US GUIDE VASC ACCESS RIGHT  03/06/2017  . IR US GUIDE VASC ACCESS RIGHT  03/25/2017  . IR US GUIDE VASC ACCESS RIGHT  01/20/2019  . IR VENIPUNCTURE 72YRS/OLDER BY MD  01/20/2019  . LEFT HEART CATH AND CORONARY ANGIOGRAPHY N/A 01/07/2018   Procedure: LEFT HEART CATH AND CORONARY ANGIOGRAPHY;  Surgeon: Troy Sine, MD;  Location: [place] CV LAB;  Service: Cardiovascular;  Laterality: N/A;  . POSTERIOR VITRECTOMY AND MEMBRANE PEEL-LEFT EYE  09/28/2002  . POSTERIOR VITRECTOMY AND MEMBRANE PEEL-RIGHT EYE  03/16/2002  . RETINAL DETACHMENT SURGERY    . TEE WITHOUT CARDIOVERSION N/A 01/14/2018  Procedure: TRANSESOPHAGEAL ECHOCARDIOGRAM (TEE);  Surgeon: Grace Isaac, MD;  Location: [facility];  Service: Open Heart Surgery;  Laterality: N/A;    There were no vitals filed for this visit.   Subjective Assessment - 05/02/20 1100    Subjective "I need to get off this cane" Reports some trouble with steps    Currently in Pain? No/denies              [facility] PT Assessment - 05/02/20 0001      Timed Up and Go Test   TUG Comments 14.01 sec with SPC; 15.37sec without AD                         [facility] Adult PT Treatment/Exercise - 05/02/20 0001      Ambulation/Gait   Stairs Yes    Stairs Assistance 6: Modified independent  (Device/Increase time)    Stair Management Technique One rail Right;One rail Left;Alternating pattern    Number of Stairs 15    Height of Stairs 6      High Level Balance   High Level Balance Activities Side stepping      Knee/Hip Exercises: Aerobic   Recumbent Bike L1 x 4 min    Nustep L3 x 6 min LE only      Knee/Hip Exercises: Machines for Strengthening   Cybex Leg Press 20# 2x10 BLE      Knee/Hip Exercises: Standing   Other Standing Knee Exercises Alt 4in toe taps wiht SPC x10 then x5 each      Knee/Hip Exercises: Seated   Sit to Sand 2 sets;10 reps;without UE support   holding yellow ball second set                   PT Short Term Goals - 03/21/20 1140      PT SHORT TERM GOAL #1   Title Pt will be independent with HEP    Time 2    Period Weeks    Status New    Target Date 04/04/20             PT Long Term Goals - 05/02/20 1114      PT LONG TERM GOAL #2   Title Pt will demonstrate TUG <12 sec with no AD    Baseline 14.01 sec with no AD; 15.3 sec with SPC    Status Partially Met      PT LONG TERM GOAL #3   Title Pt will demonstrate neutral pelvis in standing for appropriate lumbopelvic rhythm, in order to improve balance and decrease fall risk.    Status Partially Met      PT LONG TERM GOAL #4   Title Pt will be able to walk around grocery store with cart and no need to take a seated rest break.    Status Achieved                 Plan - 05/02/20 1140    Clinical Impression Statement Pt did well today, she enters clinic reporting issues with her stairs at home. She stated she can only do them one step at a time out of fear. She was able to maintain alt pattern with stair negotiation. When doing stairs she did report some vision issues due to her diabetes. Some lateral shift noted with sit to stand. No LOB with side step and alt box taps.    Personal Factors and Comorbidities Comorbidity 3+;Past/Current Experience;Fitness;Time since onset of  injury/illness/exacerbation    Comorbidities diabetes, HTN, gastroparesis    Examination-Activity Limitations Bend;Carry;Lift;Stand;Stairs;Squat;Sit;Locomotion Level    Examination-Participation Restrictions Meal Prep;Cleaning;Community Activity;Shop;Laundry;Interpersonal Relationship    Stability/Clinical Decision Making Stable/Uncomplicated    Rehab Potential Good    PT Duration 8 weeks    PT Treatment/Interventions ADLs/Self Care Home Management;Electrical Stimulation;Moist Heat;Iontophoresis 36m/ml Dexamethasone;Cryotherapy;Gait training;Stair training;Functional mobility training;Therapeutic activities;Therapeutic exercise;Balance training;Neuromuscular re-education;Manual techniques;Patient/family education;Passive range of motion;Dry needling;Vasopneumatic Device    PT Next Visit Plan LE strength/flexibility, balance, endurance           Patient will benefit from skilled therapeutic intervention in order to improve the following deficits and impairments:  Abnormal gait, Decreased range of motion, Difficulty walking, Decreased endurance, Increased muscle spasms, Decreased activity tolerance, Pain, Decreased balance, Hypomobility, Impaired flexibility, Improper body mechanics, Decreased mobility, Decreased strength, Postural dysfunction, Impaired vision/preception, Increased edema  Visit Diagnosis: Impaired functional mobility, balance, gait, and endurance  Muscle weakness (generalized)  Pain in right hip  Localized edema     Problem List Patient Active Problem List   Diagnosis Date Noted  . Closed fracture of proximal end of femur, right, initial encounter ([place] 05/09/2019  . CAD (coronary artery disease) 03/06/2019  . Acute encephalopathy 12/22/2018  . Diabetic ketoacidosis ([place] 04/30/2018  . Gastroparesis 04/13/2018  . DKA (diabetic ketoacidosis) ([place] 03/31/2018  . S/P CABG x 3 01/14/2018  . CAD (coronary artery disease), native coronary artery 01/11/2018  .  Dehydration   . MDD (major depressive disorder), single episode, severe , no psychosis ([place]   . DKA (diabetic ketoacidoses) 06/16/2017  . Tardive dyskinesia   . Type 1 diabetes mellitus with hyperlipidemia ([place] 03/18/2016  . Noncompliance with treatment plan 03/13/2016  . Essential hypertension 01/24/2016  . HLD (hyperlipidemia)   . GERD (gastroesophageal reflux disease)   . AKI (acute kidney injury) ([place]   . Anemia, iron deficiency   . Vitamin B12 deficiency 08/16/2015  . Diabetic gastroparesis ([facility]   . Diabetic neuropathy, type I diabetes mellitus ([place] 05/18/2015  . Anxiety and depression 05/18/2015    RScot Jun PTA 05/02/2020, 11:46 AM  [place] [address]Phone: [phone]  Fax:  [phone] Name: CRosemary MossbargerMRN: [id number] of Birth: 110/19/1967

## 2020-05-04 ENCOUNTER — Other Ambulatory Visit: Payer: Self-pay

## 2020-05-04 ENCOUNTER — Encounter: Payer: Medicare HMO | Attending: Endocrinology | Admitting: Nutrition

## 2020-05-04 DIAGNOSIS — E785 Hyperlipidemia, unspecified: Secondary | ICD-10-CM | POA: Diagnosis not present

## 2020-05-04 DIAGNOSIS — E1069 Type 1 diabetes mellitus with other specified complication: Secondary | ICD-10-CM | POA: Insufficient documentation

## 2020-05-04 NOTE — Patient Instructions (Signed)
Always take insulin before a bedtime snack. Give 1u of insulin for bedtime snacking of 6 crackers, or 3 graham cracker squares Give 2u for bedtime snack of popcorn Add 1-2u extra to meal when eating pizza or pasta. Add 1 extra unit to meals that are fried and/or high in fat--biscuits or cheeseburger and fries.

## 2020-05-04 NOTE — Progress Notes (Signed)
Patient reports that she is no longer having lows since the settings were changed on her pump last week.  She reports no difficulty giving boluses and says she puts the blood sugar reading into the bolus calculator every time she boluses.   Written instructions were given to her on how to change the basal rates and bolus settings, and we reviewed in detail how to change the basal rates.  She was given 2 changes to do, and did them both correctly.  She also wanted to know how to stop the pump when blood sugars drop low.  She was give written steps for this as well and re demonstrated how to do this correctly as well.  She is going to keep these directions in her purse at all times.   Patient reports that blood sugars are rising at bedtime and 12-1AM.  She reports that she is snacking at HS of 6 ritz crackers with peanut butter or graham crackers with peanut butter, or popcorn and not bolusing for this.  She was told to put in 1 unit for the crackers, and 2u for the popcorn.  She agreed to do this reluctantly.   Also discussed importance of adjusting insulin dose for higher carb meals.  Discussed the need to add 1-2units more when eating pizza, or pasta, or having a desert.  She agreed to do this, and had no final questions.

## 2020-05-06 ENCOUNTER — Ambulatory Visit: Payer: Medicare HMO | Attending: Family Medicine | Admitting: Family Medicine

## 2020-05-06 ENCOUNTER — Encounter: Payer: Self-pay | Admitting: Family Medicine

## 2020-05-06 ENCOUNTER — Other Ambulatory Visit: Payer: Self-pay

## 2020-05-06 VITALS — BP 126/67 | HR 73 | Wt 165.8 lb

## 2020-05-06 DIAGNOSIS — N3000 Acute cystitis without hematuria: Secondary | ICD-10-CM

## 2020-05-06 DIAGNOSIS — R300 Dysuria: Secondary | ICD-10-CM | POA: Diagnosis not present

## 2020-05-06 LAB — POCT URINALYSIS DIP (CLINITEK)
Bilirubin, UA: NEGATIVE
Glucose, UA: NEGATIVE mg/dL
Ketones, POC UA: NEGATIVE mg/dL
Leukocytes, UA: NEGATIVE
Nitrite, UA: POSITIVE — AB
POC PROTEIN,UA: 30 — AB
Spec Grav, UA: 1.025
Urobilinogen, UA: 0.2 U/dL
pH, UA: 5.5

## 2020-05-06 MED ORDER — NITROFURANTOIN MONOHYD MACRO 100 MG PO CAPS: 100.0000 mg | ORAL_CAPSULE | Freq: Two times a day (BID) | ORAL | 0 refills | Status: AC

## 2020-05-06 NOTE — Progress Notes (Signed)
Established Patient Office Visit  Subjective:  Patient ID: [name], female    DOB: 01/10/1966  Age: 54 y.o. MRN: [id number]  CC:  Chief Complaint  Patient presents with  . Urinary Tract Infection    HPI [name], 54 yo female, who presents secondary to the complaint of noticing that her urine has been cloudy and has had a bad odor. She also had some left lower back pain when she called to make the appointment as well as some discomfort with urination. Symptoms are similar to prior UTI's for which she has been treated with macrobid/nitrofurantion.  Past Medical History:  Diagnosis Date  . AKI (acute kidney injury) ([place])   . Anemia, iron deficiency   . Anxiety and depression 05/18/2015  . CAD (coronary artery disease), native coronary artery 01/11/2018   s/p CABG - Non-STEMI with severe three-vessel disease noted July 2019  . Depression   . Diabetic gastroparesis ([place])   . Diabetic neuropathy, type I diabetes mellitus ([place]) 05/18/2015  . DKA, type 1 ([place]) 03/18/2016  . Essential hypertension   . Gastroparesis   . GERD (gastroesophageal reflux disease)   . HLD (hyperlipidemia)   . MDD (major depressive disorder), single episode, severe , no psychosis ([place])   . Tardive dyskinesia   . Vitamin B12 deficiency 08/16/2015    Past Surgical History:  Procedure Laterality Date  . CARDIAC SURGERY    . COLONOSCOPY  09/27/2014   at [facility]  . CORONARY ARTERY BYPASS GRAFT N/A 01/14/2018   Procedure: CORONARY ARTERY BYPASS GRAFTING (CABG)X3, RIGHT AND LEFT SAPHENOUS VEIN HARVEST, MAMMARY TAKE DOWN. MAMMARY TO LAD, SVG TO PD, SVG TO DISTAL CIRC.;  Surgeon: Grace Isaac, MD;  Location: [place];  Service: Open Heart Surgery;  Laterality: N/A;  . ESOPHAGOGASTRODUODENOSCOPY  09/27/2014   at [facility], Dr Rolan Lipa. biospy neg for celiac, neg for H pylori.   . EYE SURGERY    . gailstones    . INTRAMEDULLARY (IM) NAIL INTERTROCHANTERIC Right 05/10/2019   Procedure: INTRAMEDULLARY  (IM) NAIL INTERTROCHANTRIC;  Surgeon: Lovell Sheehan, MD;  Location: [facility];  Service: Orthopedics;  Laterality: Right;  . IR FLUORO GUIDE CV LINE RIGHT  02/01/2017  . IR FLUORO GUIDE CV LINE RIGHT  03/06/2017  . IR FLUORO GUIDE CV LINE RIGHT  03/25/2017  . IR GENERIC HISTORICAL  01/24/2016   IR FLUORO GUIDE CV LINE RIGHT 01/24/2016 Darrell K Allred, PA-C [facility]  . IR GENERIC HISTORICAL  01/24/2016   IR US GUIDE VASC ACCESS RIGHT 01/24/2016 Darrell K Allred, PA-C [facility]  . IR US GUIDE VASC ACCESS RIGHT  02/01/2017  . IR US GUIDE VASC ACCESS RIGHT  03/06/2017  . IR US GUIDE VASC ACCESS RIGHT  03/25/2017  . IR US GUIDE VASC ACCESS RIGHT  01/20/2019  . IR VENIPUNCTURE 42YRS/OLDER BY MD  01/20/2019  . LEFT HEART CATH AND CORONARY ANGIOGRAPHY N/A 01/07/2018   Procedure: LEFT HEART CATH AND CORONARY ANGIOGRAPHY;  Surgeon: Troy Sine, MD;  Location: [place] [facility];  Service: Cardiovascular;  Laterality: N/A;  . POSTERIOR VITRECTOMY AND MEMBRANE PEEL-LEFT EYE  09/28/2002  . POSTERIOR VITRECTOMY AND MEMBRANE PEEL-RIGHT EYE  03/16/2002  . RETINAL DETACHMENT SURGERY    . TEE WITHOUT CARDIOVERSION N/A 01/14/2018   Procedure: TRANSESOPHAGEAL ECHOCARDIOGRAM (TEE);  Surgeon: Grace Isaac, MD;  Location: [place];  Service: Open Heart Surgery;  Laterality: N/A;    Family History  Problem Relation Age of Onset  . Cystic  fibrosis Mother   . Hypertension Father   . Diabetes Brother   . Hypertension Maternal Grandmother     Social History   Socioeconomic History  . Marital status: Single    Spouse name: Not on file  . Number of children: 1  . Years of education: Not on file  . Highest education level: Not on file  Occupational History  . Occupation: Unemployed  Tobacco Use  . Smoking status: Never Smoker  . Smokeless tobacco: Never Used  Vaping Use  . Vaping Use: Never used  Substance and Sexual Activity  . Alcohol use: No  . Drug use: Yes    Types: Marijuana    Comment: occ  .  Sexual activity: Yes    Partners: Male    Birth control/protection: None  Other Topics Concern  . Not on file  Social History Narrative  . Not on file   Social Determinants of Health   Financial Resource Strain:   . Difficulty of Paying Living Expenses: Not on file  Food Insecurity:   . Worried About Charity fundraiser in the Last Year: Not on file  . Ran Out of Food in the Last Year: Not on file  Transportation Needs:   . Lack of Transportation (Medical): Not on file  . Lack of Transportation (Non-Medical): Not on file  Physical Activity:   . Days of Exercise per Week: Not on file  . Minutes of Exercise per Session: Not on file  Stress:   . Feeling of Stress : Not on file  Social Connections:   . Frequency of Communication with Friends and Family: Not on file  . Frequency of Social Gatherings with Friends and Family: Not on file  . Attends Religious Services: Not on file  . Active Member of Clubs or Organizations: Not on file  . Attends Archivist Meetings: Not on file  . Marital Status: Not on file  Intimate Partner Violence:   . Fear of Current or Ex-Partner: Not on file  . Emotionally Abused: Not on file  . Physically Abused: Not on file  . Sexually Abused: Not on file    Outpatient Medications Prior to Visit  Medication Sig Dispense Refill  . aspirin EC 81 MG tablet Take 1 tablet (81 mg total) by mouth daily.    . busPIRone (BUSPAR) 10 MG tablet Take 1 tablet (10 mg total) by mouth 3 (three) times daily. 90 tablet 3  . docusate sodium (COLACE) 100 MG capsule Take 1 capsule (100 mg total) by mouth daily as needed for mild constipation. 10 capsule 0  . dronabinol (MARINOL) 5 MG capsule Take 1 capsule by mouth 2 (two) times daily before a meal.    . Ferrous Sulfate (IRON) 325 (65 Fe) MG TABS Take 1 tablet by mouth every other day.    . furosemide (LASIX) 40 MG tablet Take 1 tablet (40 mg total) by mouth daily. 90 tablet 3  . glucose blood (CONTOUR TEST) test  strip 1 each by Other route 4 (four) times daily. E11.9 120 each 2  . Insulin Disposable Pump (OMNIPOD DASH SYSTEM) KIT by Does not apply route.    . insulin lispro (HUMALOG) 100 UNIT/ML injection Inject 100 Units into the skin 3 (three) times daily before meals. Use max 100 units in insulin pump per day    . Insulin Syringe-Needle U-100 (BD INSULIN SYRINGE ULTRAFINE) 31G X 5/16" 0.5 ML MISC 1 each by Does not apply route 4 (four) times daily.  120 each 5  . loperamide (IMODIUM) 2 MG capsule Take 1 capsule (2 mg total) by mouth 3 (three) times daily as needed for diarrhea or loose stools. 30 capsule 0  . losartan (COZAAR) 25 MG tablet Take 1 tablet (25 mg total) by mouth daily. 90 tablet 3  . magnesium oxide (MAG-OX) 400 MG tablet Take 1 tablet by mouth in the morning and at bedtime.    . metoCLOPramide (REGLAN) 10 MG tablet TAKE 1 TABLET(10 MG) BY MOUTH THREE TIMES DAILY BEFORE MEALS 90 tablet 0  . metoprolol tartrate (LOPRESSOR) 25 MG tablet Take 0.5 tablets (12.5 mg total) by mouth 2 (two) times daily. 90 tablet 2  . ondansetron (ZOFRAN) 4 MG tablet Take 1 tablet (4 mg total) by mouth every 8 (eight) hours as needed for nausea or vomiting. 40 tablet 1  . pantoprazole (PROTONIX) 40 MG tablet Take 1 tablet (40 mg total) by mouth daily. 30 tablet 3  . polyethylene glycol (MIRALAX / GLYCOLAX) 17 g packet Take 17 g by mouth 2 (two) times daily as needed for moderate constipation. 14 each 0  . pregabalin (LYRICA) 150 MG capsule Take 1 capsule (150 mg total) by mouth 2 (two) times daily. 180 capsule 1  . rosuvastatin (CRESTOR) 10 MG tablet Take 1 tablet (10 mg total) by mouth daily. 90 tablet 3  . vitamin C (ASCORBIC ACID) 250 MG tablet Take 1 tablet by mouth every other day.     No facility-administered medications prior to visit.    Allergies  Allergen Reactions  . Anesthetics, Amide Nausea And Vomiting  . Chlorhexidine   . Penicillins Diarrhea, Nausea And Vomiting and Other (See Comments)     Has patient had a PCN reaction causing immediate rash, facial/tongue/throat swelling, SOB or lightheadedness with hypotension: Yes Has patient had a PCN reaction causing severe rash involving mucus membranes or skin necrosis: No Has patient had a PCN reaction that required hospitalization No Has patient had a PCN reaction occurring within the last 10 years: Yes  If all of the above answers are "NO", then may proceed with Cephalosporin use.   . Buprenorphine Hcl Rash  . Encainide Nausea And Vomiting  . Metoclopramide Other (See Comments)    Currently no side effects, previously reportedly had dystonia, muscle rigidity, slurred speech, tongue swelling    ROS Review of Systems  Constitutional: Negative for chills and fever.  Respiratory: Negative for cough and shortness of breath.   Cardiovascular: Negative for chest pain and palpitations.  Gastrointestinal: Negative for abdominal pain and nausea.  Genitourinary: Positive for dysuria. Negative for frequency.  Musculoskeletal: Positive for back pain (had right lower back pain earlier this week).  Neurological: Negative for dizziness and headaches.  Hematological: Negative for adenopathy. Does not bruise/bleed easily.      Objective:    Physical Exam Vitals and nursing note reviewed.  Constitutional:      General: She is not in acute distress.    Appearance: Normal appearance.     Comments: WNWD female sitting on chair in exam room with a cane nearby  Cardiovascular:     Rate and Rhythm: Normal rate and regular rhythm.  Pulmonary:     Effort: Pulmonary effort is normal.     Breath sounds: Normal breath sounds.  Abdominal:     Palpations: Abdomen is soft.     Tenderness: There is no abdominal tenderness. There is no right CVA tenderness, left CVA tenderness, guarding or rebound.  Skin:    General:  Skin is warm and dry.  Neurological:     Mental Status: She is alert.  Psychiatric:        Mood and Affect: Mood normal.         Behavior: Behavior normal.     BP 126/67 (BP Location: Left Arm, Patient Position: Sitting)   Pulse 73   Wt 165 lb 12.8 oz (75.2 kg)   SpO2 100%   BMI 31.33 kg/m  Wt Readings from Last 3 Encounters:  05/06/20 165 lb 12.8 oz (75.2 kg)  04/28/20 166 lb 3.2 oz (75.4 kg)  04/26/20 164 lb 12.8 oz (74.8 kg)     Health Maintenance Due  Topic Date Due  . Hepatitis C Screening  Never done  . PNEUMOCOCCAL POLYSACCHARIDE VACCINE AGE 84-64 HIGH RISK  Never done  . TETANUS/TDAP  Never done  . MAMMOGRAM  Never done  . COLONOSCOPY  Never done  . OPHTHALMOLOGY EXAM  01/07/2018  . FOOT EXAM  04/29/2019     Lab Results  Component Value Date   TSH 1.110 11/05/2018   Lab Results  Component Value Date   WBC 7.6 05/15/2019   HGB 8.5 (L) 05/15/2019   HCT 27.2 (L) 05/15/2019   MCV 79.1 (L) 05/15/2019   PLT 356 05/15/2019   Lab Results  Component Value Date   NA 136 05/15/2019   K 4.5 05/15/2019   CO2 22 05/15/2019   GLUCOSE 236 (H) 05/15/2019   BUN 32 (H) 05/15/2019   CREATININE 1.33 (H) 05/15/2019   BILITOT 1.2 05/11/2019   ALKPHOS 74 05/11/2019   AST 25 05/11/2019   ALT 16 05/11/2019   PROT 5.8 (L) 05/11/2019   ALBUMIN 2.6 (L) 05/11/2019   CALCIUM 8.8 (L) 05/15/2019   ANIONGAP 10 05/15/2019   Lab Results  Component Value Date   CHOL 121 01/07/2018   Lab Results  Component Value Date   HDL 47 01/07/2018   Lab Results  Component Value Date   LDLCALC 54 01/07/2018   Lab Results  Component Value Date   TRIG 101 01/07/2018   Lab Results  Component Value Date   CHOLHDL 2.6 01/07/2018   Lab Results  Component Value Date   HGBA1C 6.9 (A) 04/26/2020      Assessment & Plan:  1. Dysuria Patient with dysuria, recent low back pain and cloudy urine with a bad odor. Will check UA for possible UTI. - POCT URINALYSIS DIP (CLINITEK) - Urine Culture  2. Acute cystitis without hematuria Abnormal UA with positive nitrites and patient reports that prior UTI's treated  with macrobid. RX provided and urine will be sent for culture as patient reports being on antibiotics for UTI several times in the past.  - Urine Culture    Follow-up: Return for UTI- 1 or 2 weeks if continued symptoms.    Antony Blackbird, MD

## 2020-05-09 ENCOUNTER — Ambulatory Visit: Payer: Medicare HMO | Admitting: Physical Therapy

## 2020-05-10 LAB — URINE CULTURE

## 2020-05-13 ENCOUNTER — Ambulatory Visit: Payer: Medicare HMO | Admitting: Cardiovascular Disease

## 2020-05-16 ENCOUNTER — Encounter: Payer: Self-pay | Admitting: Physical Therapy

## 2020-05-16 ENCOUNTER — Other Ambulatory Visit: Payer: Self-pay

## 2020-05-16 ENCOUNTER — Ambulatory Visit: Payer: Medicare HMO | Admitting: Physical Therapy

## 2020-05-16 DIAGNOSIS — Z7409 Other reduced mobility: Secondary | ICD-10-CM

## 2020-05-16 DIAGNOSIS — M6281 Muscle weakness (generalized): Secondary | ICD-10-CM

## 2020-05-16 DIAGNOSIS — M25551 Pain in right hip: Secondary | ICD-10-CM | POA: Diagnosis not present

## 2020-05-16 DIAGNOSIS — R600 Localized edema: Secondary | ICD-10-CM | POA: Diagnosis not present

## 2020-05-16 NOTE — Therapy (Signed)
[place] [place], [place], [address] Phone: [phone]   Fax:  [phone]  Physical Therapy Treatment  Patient Details  Name: [name] MRN: [id number] Date of Birth: 18-Aug-1965 Referring Provider (PT): Kurtis Bushman, MD   Encounter Date: 05/16/2020   PT End of Session - 05/16/20 1136    Visit Number 5    Number of Visits 12    Date for PT Re-Evaluation 05/30/20    PT Start Time 1101    PT Stop Time 1144    PT Time Calculation (min) 43 min    Activity Tolerance Patient tolerated treatment well    Behavior During Therapy [facility] for tasks assessed/performed           Past Medical History:  Diagnosis Date  . AKI (acute kidney injury) ([place])   . Anemia, iron deficiency   . Anxiety and depression 05/18/2015  . CAD (coronary artery disease), native coronary artery 01/11/2018   s/p CABG - Non-STEMI with severe three-vessel disease noted July 2019  . Depression   . Diabetic gastroparesis ([place])   . Diabetic neuropathy, type I diabetes mellitus ([place]) 05/18/2015  . DKA, type 1 ([place]) 03/18/2016  . Essential hypertension   . Gastroparesis   . GERD (gastroesophageal reflux disease)   . HLD (hyperlipidemia)   . MDD (major depressive disorder), single episode, severe , no psychosis ([place])   . Tardive dyskinesia   . Vitamin B12 deficiency 08/16/2015    Past Surgical History:  Procedure Laterality Date  . CARDIAC SURGERY    . COLONOSCOPY  09/27/2014   at [facility]  . CORONARY ARTERY BYPASS GRAFT N/A 01/14/2018   Procedure: CORONARY ARTERY BYPASS GRAFTING (CABG)X3, RIGHT AND LEFT SAPHENOUS VEIN HARVEST, MAMMARY TAKE DOWN. MAMMARY TO LAD, SVG TO PD, SVG TO DISTAL CIRC.;  Surgeon: Grace Isaac, MD;  Location: [place];  Service: Open Heart Surgery;  Laterality: N/A;  . ESOPHAGOGASTRODUODENOSCOPY  09/27/2014   at [facility], Dr Rolan Lipa. biospy neg for celiac, neg for H pylori.   . EYE SURGERY    . gailstones    .  INTRAMEDULLARY (IM) NAIL INTERTROCHANTERIC Right 05/10/2019   Procedure: INTRAMEDULLARY (IM) NAIL INTERTROCHANTRIC;  Surgeon: Lovell Sheehan, MD;  Location: [facility];  Service: Orthopedics;  Laterality: Right;  . IR FLUORO GUIDE CV LINE RIGHT  02/01/2017  . IR FLUORO GUIDE CV LINE RIGHT  03/06/2017  . IR FLUORO GUIDE CV LINE RIGHT  03/25/2017  . IR GENERIC HISTORICAL  01/24/2016   IR FLUORO GUIDE CV LINE RIGHT 01/24/2016 Darrell K Allred, PA-C [facility]  . IR GENERIC HISTORICAL  01/24/2016   IR US GUIDE VASC ACCESS RIGHT 01/24/2016 Darrell K Allred, PA-C [facility]  . IR US GUIDE VASC ACCESS RIGHT  02/01/2017  . IR US GUIDE VASC ACCESS RIGHT  03/06/2017  . IR US GUIDE VASC ACCESS RIGHT  03/25/2017  . IR US GUIDE VASC ACCESS RIGHT  01/20/2019  . IR VENIPUNCTURE 18YRS/OLDER BY MD  01/20/2019  . LEFT HEART CATH AND CORONARY ANGIOGRAPHY N/A 01/07/2018   Procedure: LEFT HEART CATH AND CORONARY ANGIOGRAPHY;  Surgeon: Troy Sine, MD;  Location: [place] [facility];  Service: Cardiovascular;  Laterality: N/A;  . POSTERIOR VITRECTOMY AND MEMBRANE PEEL-LEFT EYE  09/28/2002  . POSTERIOR VITRECTOMY AND MEMBRANE PEEL-RIGHT EYE  03/16/2002  . RETINAL DETACHMENT SURGERY    . TEE WITHOUT CARDIOVERSION N/A 01/14/2018   Procedure: TRANSESOPHAGEAL ECHOCARDIOGRAM (TEE);  Surgeon: Lanelle Bal  B, MD;  Location: [place];  Service: Open Heart Surgery;  Laterality: N/A;    There were no vitals filed for this visit.   Subjective Assessment - 05/16/20 1115    Subjective "im doing fine"    Currently in Pain? No/denies                             [facility] Adult PT Treatment/Exercise - 05/16/20 0001      Knee/Hip Exercises: Aerobic   Recumbent Bike L1 55mns    Nustep L4 429ms      Knee/Hip Exercises: Machines for Strengthening   Cybex Knee Extension #5 x15    Cybex Knee Flexion #15 x15    Cybex Leg Press #20 2x10 BLE      Knee/Hip Exercises: Standing   Hip ADduction Both;10 reps    Hip  ADduction Limitations #2    Hip Extension Both;10 reps    Extension Limitations 2#    Other Standing Knee Exercises Marching x10 (#2)       Knee/Hip Exercises: Seated   Sit to Sand 2 sets;10 reps;without UE support   Mini ball (yellow)                   PT Short Term Goals - 03/21/20 1140      PT SHORT TERM GOAL #1   Title Pt will be independent with HEP    Time 2    Period Weeks    Status New    Target Date 04/04/20             PT Long Term Goals - 05/02/20 1114      PT LONG TERM GOAL #2   Title Pt will demonstrate TUG <12 sec with no AD    Baseline 14.01 sec with no AD; 15.3 sec with SPC    Status Partially Met      PT LONG TERM GOAL #3   Title Pt will demonstrate neutral pelvis in standing for appropriate lumbopelvic rhythm, in order to improve balance and decrease fall risk.    Status Partially Met      PT LONG TERM GOAL #4   Title Pt will be able to walk around grocery store with cart and no need to take a seated rest break.    Status Achieved                 Plan - 05/16/20 1137    Clinical Impression Statement Emphasized LE strengthening today with use of standing resistance exercises as well as some machines. Takes patient a while to transition between activites due to decreased gait speed. Needed cuing to prevent lateral trunk lean and foward flexion during standing hip exercises.    PT Treatment/Interventions ADLs/Self Care Home Management;Electrical Stimulation;Moist Heat;Iontophoresis 19m35ml Dexamethasone;Cryotherapy;Gait training;Stair training;Functional mobility training;Therapeutic activities;Therapeutic exercise;Balance training;Neuromuscular re-education;Manual techniques;Patient/family education;Passive range of motion;Dry needling;Vasopneumatic Device    PT Next Visit Plan LE strength/flexibility, balance, endurance           Patient will benefit from skilled therapeutic intervention in order to improve the following deficits and  impairments:  Abnormal gait, Decreased range of motion, Difficulty walking, Decreased endurance, Increased muscle spasms, Decreased activity tolerance, Pain, Decreased balance, Hypomobility, Impaired flexibility, Improper body mechanics, Decreased mobility, Decreased strength, Postural dysfunction, Impaired vision/preception, Increased edema  Visit Diagnosis: Impaired functional mobility, balance, gait, and endurance  Muscle weakness (generalized)  Pain in right hip     Problem  List Patient Active Problem List   Diagnosis Date Noted  . Closed fracture of proximal end of femur, right, initial encounter ([place]) 05/09/2019  . CAD (coronary artery disease) 03/06/2019  . Acute encephalopathy 12/22/2018  . Diabetic ketoacidosis ([place]) 04/30/2018  . Gastroparesis 04/13/2018  . DKA (diabetic ketoacidosis) ([place]) 03/31/2018  . S/P CABG x 3 01/14/2018  . CAD (coronary artery disease), native coronary artery 01/11/2018  . Dehydration   . MDD (major depressive disorder), single episode, severe , no psychosis ([place])   . DKA (diabetic ketoacidoses) 06/16/2017  . Tardive dyskinesia   . Type 1 diabetes mellitus with hyperlipidemia ([place]) 03/18/2016  . Noncompliance with treatment plan 03/13/2016  . Essential hypertension 01/24/2016  . HLD (hyperlipidemia)   . GERD (gastroesophageal reflux disease)   . AKI (acute kidney injury) ([place])   . Anemia, iron deficiency   . Vitamin B12 deficiency 08/16/2015  . Diabetic gastroparesis ([place])   . Diabetic neuropathy, type I diabetes mellitus ([place]) 05/18/2015  . Anxiety and depression 05/18/2015    Lavenia Atlas, SPTA 05/16/2020, 11:52 AM  [address] [place], [place], [address] Phone: [phone]   Fax:  [phone]  Name: [name] MRN: [id number] Date of Birth: 07-Mar-1966

## 2020-05-17 ENCOUNTER — Other Ambulatory Visit: Payer: Medicare HMO

## 2020-05-18 ENCOUNTER — Ambulatory Visit: Payer: Medicare HMO | Admitting: Physician Assistant

## 2020-05-23 ENCOUNTER — Other Ambulatory Visit: Payer: Medicare HMO

## 2020-05-24 ENCOUNTER — Ambulatory Visit: Payer: Medicare HMO | Admitting: Physical Therapy

## 2020-05-31 ENCOUNTER — Ambulatory Visit: Payer: Medicare HMO

## 2020-05-31 ENCOUNTER — Other Ambulatory Visit: Payer: Self-pay

## 2020-05-31 DIAGNOSIS — R6889 Other general symptoms and signs: Secondary | ICD-10-CM | POA: Diagnosis not present

## 2020-05-31 LAB — HM DIABETES EYE EXAM

## 2020-06-01 ENCOUNTER — Ambulatory Visit: Payer: Medicare HMO | Attending: Orthopedic Surgery | Admitting: Physical Therapy

## 2020-06-02 DIAGNOSIS — R110 Nausea: Secondary | ICD-10-CM | POA: Diagnosis not present

## 2020-06-02 DIAGNOSIS — K2100 Gastro-esophageal reflux disease with esophagitis, without bleeding: Secondary | ICD-10-CM | POA: Diagnosis not present

## 2020-06-02 DIAGNOSIS — E109 Type 1 diabetes mellitus without complications: Secondary | ICD-10-CM | POA: Diagnosis not present

## 2020-06-02 DIAGNOSIS — D509 Iron deficiency anemia, unspecified: Secondary | ICD-10-CM | POA: Diagnosis not present

## 2020-06-02 DIAGNOSIS — K5904 Chronic idiopathic constipation: Secondary | ICD-10-CM | POA: Diagnosis not present

## 2020-06-02 DIAGNOSIS — R6889 Other general symptoms and signs: Secondary | ICD-10-CM | POA: Diagnosis not present

## 2020-06-02 DIAGNOSIS — R140 Abdominal distension (gaseous): Secondary | ICD-10-CM | POA: Diagnosis not present

## 2020-06-02 DIAGNOSIS — K3184 Gastroparesis: Secondary | ICD-10-CM | POA: Diagnosis not present

## 2020-06-08 ENCOUNTER — Ambulatory Visit: Payer: Medicare HMO | Admitting: Physical Therapy

## 2020-06-13 ENCOUNTER — Ambulatory Visit (INDEPENDENT_AMBULATORY_CARE_PROVIDER_SITE_OTHER): Payer: Medicare HMO | Admitting: Endocrinology

## 2020-06-13 ENCOUNTER — Other Ambulatory Visit: Payer: Self-pay

## 2020-06-13 VITALS — Ht 61.0 in | Wt 168.6 lb

## 2020-06-13 DIAGNOSIS — E1069 Type 1 diabetes mellitus with other specified complication: Secondary | ICD-10-CM

## 2020-06-13 DIAGNOSIS — E1065 Type 1 diabetes mellitus with hyperglycemia: Secondary | ICD-10-CM | POA: Diagnosis not present

## 2020-06-13 DIAGNOSIS — R350 Frequency of micturition: Secondary | ICD-10-CM

## 2020-06-13 DIAGNOSIS — E785 Hyperlipidemia, unspecified: Secondary | ICD-10-CM

## 2020-06-13 DIAGNOSIS — E1040 Type 1 diabetes mellitus with diabetic neuropathy, unspecified: Secondary | ICD-10-CM | POA: Diagnosis not present

## 2020-06-13 LAB — URINALYSIS, ROUTINE W REFLEX MICROSCOPIC
Bilirubin Urine: NEGATIVE
Ketones, ur: NEGATIVE
Leukocytes,Ua: NEGATIVE
Nitrite: POSITIVE — AB
Specific Gravity, Urine: 1.02 (ref 1.000–1.030)
Total Protein, Urine: NEGATIVE
Urine Glucose: >=1000 — AB
Urobilinogen, UA: 0.2 (ref 0.0–1.0)
pH: 6 (ref 5.0–8.0)

## 2020-06-13 LAB — GLUCOSE, POCT (MANUAL RESULT ENTRY): POC Glucose: 452 mg/dl — AB (ref 70–99)

## 2020-06-13 NOTE — Progress Notes (Signed)
Patient ID: [name], female   DOB: 1965/10/12, 54 y.o.   MRN: [id number]           Reason for Appointment : Follow-up for Type 1 Diabetes  History of Present Illness          Diagnosis: Type 1 diabetes mellitus, date of diagnosis: 1977         Previous history:  She has mostly been followed by primary care physicians for her diabetes with consistently poor control She has had periodic episodes of ketoacidosis Previous insulin regimen:  BASAL insulin: LANTUS 15 units in a.m. MEALTIME insulin: Novolog variable doses: Usually 6-8 at breakfast, 2-3 at lunch and dinner   Recent history:   INSULIN regimen: OmniPod insulin pump since 07/07/2019  Basal rate: Midnight-5 AM = 0.90 units, 5 AM to 11 AM = 0.6, 11 AM-12 AM = 0.85 I/C ratio 1:1;   ISF: 40, target:120 with correction over 120   A1c is last improved at 6.9   Current management, blood sugar patterns and problems identified:    She has been having much higher blood sugars lately and not clear why  Although she did have UTI last month she is not having any active symptoms of fever now  As before she has significant variability in her blood sugars from day-to-day  Blood sugar patterns are discussed below in her Dexcom download interpretation  Generally appears to be requiring more insulin  Also may be getting higher readings after meals at times including today when she had a biscuit, grits and half banana at the same time causing her blood sugar to be 450  She does not appear to be consistently taking CORRECTION doses when blood sugars are high including when she wakes up or during the night  Has only rarely has had tendency to low normal or low sugars  She is filling up her pump with 100 units of insulin on may sometimes run short before her 72-hour control   CONTINUOUS GLUCOSE MONITORING RECORD INTERPRETATION     HYPERGLYCEMIA appears to be occurring at all different times with overall best blood sugars only  early morning between 5-6 AM.  Blood sugars fluctuate from day-to-day considerably at all times  HIGHEST blood sugars overall appear to be in the early afternoons and also late evenings after about 7 PM  OVERNIGHT blood sugars are highly variable but generally averaging over 200 and better in the last week particularly on Friday and Saturday  Postprandial readings are mostly higher after her first meal late morning but no consistent pattern later in the day  Only rarely will have low normal or low blood sugars early morning around 7-8 AM  Dates of Recording: Last 2 weeks  Sensor description: G6  Results statistics:   CGM use % of time  67  2-week average  229+/-72  Time in range  25       %  % Time Above 180  75  % Time above 250  37  % Time Below 70  0.2   Previously:  CGM use % of time  98  Average and SD  189, +/-70  Time in range  46     %  % Time Above 180  53  % Time above 250  18  % Time Below target  1.2    Symptoms of hypoglycemia: Weakness, sometimes none Treatment of hypoglycemia: Snacks         Self-care: The diet that the patient has been  following is: None  Mealtimes are: Breakfast egg, cheese, sausage, occasionally grits, toast;   Lunch: Sandwich or only snacks, dinner: baked chricken, rice, potatoes                Dietician consultation: Most recent: years ago.         CDE consultation: 04/2018  Wt Readings from Last 3 Encounters:  06/13/20 168 lb 9.6 oz (76.5 kg)  05/06/20 165 lb 12.8 oz (75.2 kg)  04/28/20 166 lb 3.2 oz (75.4 kg)   Diabetes labs:  Lab Results  Component Value Date   HGBA1C 6.9 (A) 04/26/2020   HGBA1C 8.1 (A) 02/15/2020   HGBA1C 7.2 (A) 07/28/2019   Lab Results  Component Value Date   MICROALBUR 14.3 (H) 04/28/2018   LDLCALC 54 01/07/2018   CREATININE 1.33 (H) 05/15/2019    Lab Results  Component Value Date   MICRALBCREAT 17.1 04/28/2018   MICRALBCREAT 254 (H) 09/04/2016     Allergies as of 06/13/2020       Reactions   Anesthetics, Amide Nausea And Vomiting   Chlorhexidine    Penicillins Diarrhea, Nausea And Vomiting, Other (See Comments)   Has patient had a PCN reaction causing immediate rash, facial/tongue/throat swelling, SOB or lightheadedness with hypotension: Yes Has patient had a PCN reaction causing severe rash involving mucus membranes or skin necrosis: No Has patient had a PCN reaction that required hospitalization No Has patient had a PCN reaction occurring within the last 10 years: Yes  If all of the above answers are "NO", then may proceed with Cephalosporin use.   Buprenorphine Hcl Rash   Encainide Nausea And Vomiting   Metoclopramide Other (See Comments)   Currently no side effects, previously reportedly had dystonia, muscle rigidity, slurred speech, tongue swelling      Medication List       Accurate as of June 13, 2020  1:50 PM. If you have any questions, ask your nurse or doctor.        aspirin EC 81 MG tablet Take 1 tablet (81 mg total) by mouth daily.   busPIRone 10 MG tablet Commonly known as: BUSPAR Take 1 tablet (10 mg total) by mouth 3 (three) times daily.   Contour Test test strip Generic drug: glucose blood 1 each by Other route 4 (four) times daily. E11.9   docusate sodium 100 MG capsule Commonly known as: Colace Take 1 capsule (100 mg total) by mouth daily as needed for mild constipation.   dronabinol 5 MG capsule Commonly known as: MARINOL Take 1 capsule by mouth 2 (two) times daily before a meal.   furosemide 40 MG tablet Commonly known as: LASIX Take 1 tablet (40 mg total) by mouth daily.   insulin lispro 100 UNIT/ML injection Commonly known as: HUMALOG Inject 100 Units into the skin 3 (three) times daily before meals. Use max 100 units in insulin pump per day   Insulin Syringe-Needle U-100 31G X 5/16" 0.5 ML Misc Commonly known as: BD Insulin Syringe Ultrafine 1 each by Does not apply route 4 (four) times daily.   Iron 325 (65 Fe)  MG Tabs Take 1 tablet by mouth every other day.   loperamide 2 MG capsule Commonly known as: IMODIUM Take 1 capsule (2 mg total) by mouth 3 (three) times daily as needed for diarrhea or loose stools.   losartan 25 MG tablet Commonly known as: COZAAR Take 1 tablet (25 mg total) by mouth daily.   metoCLOPramide 10 MG tablet Commonly known as:  REGLAN TAKE 1 TABLET(10 MG) BY MOUTH THREE TIMES DAILY BEFORE MEALS   metoprolol tartrate 25 MG tablet Commonly known as: LOPRESSOR Take 0.5 tablets (12.5 mg total) by mouth 2 (two) times daily.   NovoLOG 100 UNIT/ML injection Generic drug: insulin aspart Inject 100 Units into the skin 3 (three) times daily before meals. Use max of 100 units per day in insulin pump   OmniPod Dash System Kit by Does not apply route.   ondansetron 4 MG tablet Commonly known as: Zofran Take 1 tablet (4 mg total) by mouth every 8 (eight) hours as needed for nausea or vomiting.   pantoprazole 40 MG tablet Commonly known as: PROTONIX Take 1 tablet (40 mg total) by mouth daily.   polyethylene glycol 17 g packet Commonly known as: MIRALAX / GLYCOLAX Take 17 g by mouth 2 (two) times daily as needed for moderate constipation.   pregabalin 150 MG capsule Commonly known as: Lyrica Take 1 capsule (150 mg total) by mouth 2 (two) times daily.   rosuvastatin 10 MG tablet Commonly known as: CRESTOR Take 1 tablet (10 mg total) by mouth daily.   vitamin C 250 MG tablet Commonly known as: ASCORBIC ACID Take 1 tablet by mouth every other day.       Allergies:  Allergies  Allergen Reactions  . Anesthetics, Amide Nausea And Vomiting  . Chlorhexidine   . Penicillins Diarrhea, Nausea And Vomiting and Other (See Comments)    Has patient had a PCN reaction causing immediate rash, facial/tongue/throat swelling, SOB or lightheadedness with hypotension: Yes Has patient had a PCN reaction causing severe rash involving mucus membranes or skin necrosis: No Has patient  had a PCN reaction that required hospitalization No Has patient had a PCN reaction occurring within the last 10 years: Yes  If all of the above answers are "NO", then may proceed with Cephalosporin use.   . Buprenorphine Hcl Rash  . Encainide Nausea And Vomiting  . Metoclopramide Other (See Comments)    Currently no side effects, previously reportedly had dystonia, muscle rigidity, slurred speech, tongue swelling    Past Medical History:  Diagnosis Date  . AKI (acute kidney injury) ([place])   . Anemia, iron deficiency   . Anxiety and depression 05/18/2015  . CAD (coronary artery disease), native coronary artery 01/11/2018   s/p CABG - Non-STEMI with severe three-vessel disease noted July 2019  . Depression   . Diabetic gastroparesis ([place])   . Diabetic neuropathy, type I diabetes mellitus ([place]) 05/18/2015  . DKA, type 1 ([place]) 03/18/2016  . Essential hypertension   . Gastroparesis   . GERD (gastroesophageal reflux disease)   . HLD (hyperlipidemia)   . MDD (major depressive disorder), single episode, severe , no psychosis ([place])   . Tardive dyskinesia   . Vitamin B12 deficiency 08/16/2015    Past Surgical History:  Procedure Laterality Date  . CARDIAC SURGERY    . COLONOSCOPY  09/27/2014   at [facility]  . CORONARY ARTERY BYPASS GRAFT N/A 01/14/2018   Procedure: CORONARY ARTERY BYPASS GRAFTING (CABG)X3, RIGHT AND LEFT SAPHENOUS VEIN HARVEST, MAMMARY TAKE DOWN. MAMMARY TO LAD, SVG TO PD, SVG TO DISTAL CIRC.;  Surgeon: Grace Isaac, MD;  Location: [place];  Service: Open Heart Surgery;  Laterality: N/A;  . ESOPHAGOGASTRODUODENOSCOPY  09/27/2014   at [facility], Dr Rolan Lipa. biospy neg for celiac, neg for H pylori.   . EYE SURGERY    . gailstones    . INTRAMEDULLARY (IM) NAIL INTERTROCHANTERIC Right 05/10/2019  Procedure: INTRAMEDULLARY (IM) NAIL INTERTROCHANTRIC;  Surgeon: Lovell Sheehan, MD;  Location: [facility];  Service: Orthopedics;  Laterality: Right;  . IR FLUORO GUIDE CV LINE  RIGHT  02/01/2017  . IR FLUORO GUIDE CV LINE RIGHT  03/06/2017  . IR FLUORO GUIDE CV LINE RIGHT  03/25/2017  . IR GENERIC HISTORICAL  01/24/2016   IR FLUORO GUIDE CV LINE RIGHT 01/24/2016 Darrell K Allred, PA-C [facility]  . IR GENERIC HISTORICAL  01/24/2016   IR US GUIDE VASC ACCESS RIGHT 01/24/2016 Darrell K Allred, PA-C [facility]  . IR US GUIDE VASC ACCESS RIGHT  02/01/2017  . IR US GUIDE VASC ACCESS RIGHT  03/06/2017  . IR US GUIDE VASC ACCESS RIGHT  03/25/2017  . IR US GUIDE VASC ACCESS RIGHT  01/20/2019  . IR VENIPUNCTURE 40YRS/OLDER BY MD  01/20/2019  . LEFT HEART CATH AND CORONARY ANGIOGRAPHY N/A 01/07/2018   Procedure: LEFT HEART CATH AND CORONARY ANGIOGRAPHY;  Surgeon: Troy Sine, MD;  Location: [place] [facility];  Service: Cardiovascular;  Laterality: N/A;  . POSTERIOR VITRECTOMY AND MEMBRANE PEEL-LEFT EYE  09/28/2002  . POSTERIOR VITRECTOMY AND MEMBRANE PEEL-RIGHT EYE  03/16/2002  . RETINAL DETACHMENT SURGERY    . TEE WITHOUT CARDIOVERSION N/A 01/14/2018   Procedure: TRANSESOPHAGEAL ECHOCARDIOGRAM (TEE);  Surgeon: Grace Isaac, MD;  Location: [place];  Service: Open Heart Surgery;  Laterality: N/A;    Family History  Problem Relation Age of Onset  . Cystic fibrosis Mother   . Hypertension Father   . Diabetes Brother   . Hypertension Maternal Grandmother     Social History:  reports that she has never smoked. She has never used smokeless tobacco. She reports current drug use. Drug: Marijuana. She reports that she does not drink alcohol.      Review of Systems      Lipids: She had been taking atorvastatin for history of CAD, now, prescribed by cardiologist but no recent labs available  Lab Results  Component Value Date   CHOL 121 01/07/2018   HDL 47 01/07/2018   LDLCALC 54 01/07/2018   TRIG 101 01/07/2018   CHOLHDL 2.6 01/07/2018     DIABETES COMPLICATIONS: Gastroparesis treated with Phenergan as needed Also now on Reglan regularly No recent nausea   On Lyrica  150 mg for peripheral neuropathy with pain with generally good relief    Physical Examination:  Ht 5' 1"  (1.549 m)   Wt 168 lb 9.6 oz (76.5 kg)   BMI 31.86 kg/m       ASSESSMENT:  Diabetes type 1, long-standing with multiple complications  See history of present illness for detailed discussion of current diabetes management, blood sugar patterns and problems identified  Her A1c is last better at 6.9  However her blood sugars are quite out of control now as discussed above Only 25% of the time she is getting blood sugars within target range lately Blood sugars are somewhat lower overall overnight likely that getting excessively high after her first meal in the morning and staying high frequently late evenings Her download interpretation of the CGM as well as day-to-day management discussed in detail She did not appear to be getting enough boluses for at least her breakfast meal and empirically using only about 1 unit per carbohydrate exchange for boluses without carbohydrate counting  LIPIDS: Will need follow-up in the next visit    PLAN:   She will use 2 units instead of 1 before her start exchange for her meals  and may take as much as 6 units for boluses especially at breakfast Maximum basal rate was increased to 2.0 from 1.0 She will need to bolus for all meals and snacks Also to bolus any time when her blood sugars are at least over 200 even when she is not eating including overnight or in the morning when waking up  Basal rates will be changed as follows 10:30 AM-4 PM = 1.0 and 4 PM-midnight = 1.15 She was told how to change the settings  Urinalysis today   There are no Patient Instructions on file for this visit.    Elayne Snare 06/13/2020, 1:50 PM     Note: This note was prepared with Dragon voice recognition system technology. Any transcriptional errors that result from this process are unintentional.

## 2020-06-14 LAB — MICROALBUMIN / CREATININE URINE RATIO
Creatinine,U: 85.7 mg/dL
Microalb Creat Ratio: 4.3 mg/g (ref 0.0–30.0)
Microalb, Ur: 3.7 mg/dL — ABNORMAL HIGH (ref 0.0–1.9)

## 2020-06-16 DIAGNOSIS — R112 Nausea with vomiting, unspecified: Secondary | ICD-10-CM | POA: Diagnosis not present

## 2020-06-16 DIAGNOSIS — I2510 Atherosclerotic heart disease of native coronary artery without angina pectoris: Secondary | ICD-10-CM | POA: Diagnosis not present

## 2020-06-16 DIAGNOSIS — E1165 Type 2 diabetes mellitus with hyperglycemia: Secondary | ICD-10-CM | POA: Diagnosis not present

## 2020-06-16 DIAGNOSIS — D72829 Elevated white blood cell count, unspecified: Secondary | ICD-10-CM | POA: Diagnosis not present

## 2020-06-16 DIAGNOSIS — E875 Hyperkalemia: Secondary | ICD-10-CM | POA: Diagnosis not present

## 2020-06-16 DIAGNOSIS — Z720 Tobacco use: Secondary | ICD-10-CM | POA: Diagnosis not present

## 2020-06-16 DIAGNOSIS — I499 Cardiac arrhythmia, unspecified: Secondary | ICD-10-CM | POA: Diagnosis not present

## 2020-06-16 DIAGNOSIS — E1010 Type 1 diabetes mellitus with ketoacidosis without coma: Secondary | ICD-10-CM | POA: Diagnosis not present

## 2020-06-16 DIAGNOSIS — E1110 Type 2 diabetes mellitus with ketoacidosis without coma: Secondary | ICD-10-CM | POA: Diagnosis not present

## 2020-06-16 DIAGNOSIS — R1084 Generalized abdominal pain: Secondary | ICD-10-CM | POA: Diagnosis not present

## 2020-06-16 DIAGNOSIS — R0689 Other abnormalities of breathing: Secondary | ICD-10-CM | POA: Diagnosis not present

## 2020-06-16 DIAGNOSIS — N179 Acute kidney failure, unspecified: Secondary | ICD-10-CM | POA: Diagnosis not present

## 2020-06-16 DIAGNOSIS — K3184 Gastroparesis: Secondary | ICD-10-CM | POA: Diagnosis not present

## 2020-06-16 DIAGNOSIS — R000 Tachycardia, unspecified: Secondary | ICD-10-CM | POA: Diagnosis not present

## 2020-06-16 DIAGNOSIS — Z833 Family history of diabetes mellitus: Secondary | ICD-10-CM | POA: Diagnosis not present

## 2020-06-16 DIAGNOSIS — Z9114 Patient's other noncompliance with medication regimen: Secondary | ICD-10-CM | POA: Diagnosis not present

## 2020-06-16 DIAGNOSIS — E1043 Type 1 diabetes mellitus with diabetic autonomic (poly)neuropathy: Secondary | ICD-10-CM | POA: Diagnosis not present

## 2020-06-16 DIAGNOSIS — E1022 Type 1 diabetes mellitus with diabetic chronic kidney disease: Secondary | ICD-10-CM | POA: Diagnosis not present

## 2020-06-16 DIAGNOSIS — Z8349 Family history of other endocrine, nutritional and metabolic diseases: Secondary | ICD-10-CM | POA: Diagnosis not present

## 2020-06-16 DIAGNOSIS — Z794 Long term (current) use of insulin: Secondary | ICD-10-CM | POA: Diagnosis not present

## 2020-06-16 DIAGNOSIS — N183 Chronic kidney disease, stage 3 unspecified: Secondary | ICD-10-CM | POA: Diagnosis not present

## 2020-06-16 DIAGNOSIS — Z743 Need for continuous supervision: Secondary | ICD-10-CM | POA: Diagnosis not present

## 2020-06-16 DIAGNOSIS — N200 Calculus of kidney: Secondary | ICD-10-CM | POA: Diagnosis not present

## 2020-06-16 DIAGNOSIS — I10 Essential (primary) hypertension: Secondary | ICD-10-CM | POA: Diagnosis not present

## 2020-06-16 DIAGNOSIS — Z951 Presence of aortocoronary bypass graft: Secondary | ICD-10-CM | POA: Diagnosis not present

## 2020-06-16 DIAGNOSIS — R6889 Other general symptoms and signs: Secondary | ICD-10-CM | POA: Diagnosis not present

## 2020-06-16 DIAGNOSIS — Z955 Presence of coronary angioplasty implant and graft: Secondary | ICD-10-CM | POA: Diagnosis not present

## 2020-06-16 DIAGNOSIS — Z8249 Family history of ischemic heart disease and other diseases of the circulatory system: Secondary | ICD-10-CM | POA: Diagnosis not present

## 2020-06-16 DIAGNOSIS — R1310 Dysphagia, unspecified: Secondary | ICD-10-CM | POA: Diagnosis not present

## 2020-06-16 DIAGNOSIS — Z20822 Contact with and (suspected) exposure to covid-19: Secondary | ICD-10-CM | POA: Diagnosis not present

## 2020-06-16 DIAGNOSIS — I129 Hypertensive chronic kidney disease with stage 1 through stage 4 chronic kidney disease, or unspecified chronic kidney disease: Secondary | ICD-10-CM | POA: Diagnosis not present

## 2020-06-16 DIAGNOSIS — R4182 Altered mental status, unspecified: Secondary | ICD-10-CM | POA: Diagnosis not present

## 2020-06-21 ENCOUNTER — Other Ambulatory Visit: Payer: Self-pay

## 2020-06-21 DIAGNOSIS — E1065 Type 1 diabetes mellitus with hyperglycemia: Secondary | ICD-10-CM | POA: Diagnosis not present

## 2020-06-21 NOTE — Patient Outreach (Signed)
[facility]) Care Management  06/21/2020  [name] 1966-02-27 [id number]   Referral Date: 06/21/20 Referral Source: [employer] Report Facility: [facility]   Outreach Attempt: no answer.  HIPAA compliant voice message left.    Plan: RN CM will attempt again within 4 business days and send letter.     Bary Leriche, RN, MSN [facility] Care Management Care Management Coordinator Direct Line [phone] Toll Free: [phone]  Fax: [phone]

## 2020-06-22 ENCOUNTER — Other Ambulatory Visit: Payer: Self-pay

## 2020-06-22 NOTE — Patient Outreach (Signed)
[facility]) Care Management  06/22/2020  Alvina Strother 30-Jul-1965 [id number]   Referral Date: 06/21/20 Referral Source: [employer] Report Facility: [facility]   Outreach Attempt: no answer.  HIPAA compliant voice message left.    Plan: RN CM will attempt again within 4 business days.  Bary Leriche, RN, MSN [facility] Care Management Care Management Coordinator Direct Line [phone] Cell [phone] Toll Free: [phone]  Fax: [phone]

## 2020-06-23 ENCOUNTER — Other Ambulatory Visit: Payer: Self-pay

## 2020-06-23 NOTE — Patient Outreach (Signed)
[facility]) Care Management  06/23/2020  [name] [birth year] [id number]   Referral Date:06/21/20 Referral Source:[employer] Report Facility: [facility] Attempt:no answer. HIPAA compliant voice message left.    Plan:RN CM will attempt again in the next 3-4 weeks.  Bary Leriche, RN, MSN [facility] Care Management Care Management Coordinator Direct Line [phone] Cell [phone] Toll Free: [phone]  Fax: [phone]

## 2020-07-03 DIAGNOSIS — I129 Hypertensive chronic kidney disease with stage 1 through stage 4 chronic kidney disease, or unspecified chronic kidney disease: Secondary | ICD-10-CM | POA: Diagnosis not present

## 2020-07-03 DIAGNOSIS — E1122 Type 2 diabetes mellitus with diabetic chronic kidney disease: Secondary | ICD-10-CM | POA: Diagnosis not present

## 2020-07-03 DIAGNOSIS — R000 Tachycardia, unspecified: Secondary | ICD-10-CM | POA: Diagnosis not present

## 2020-07-03 DIAGNOSIS — I10 Essential (primary) hypertension: Secondary | ICD-10-CM | POA: Diagnosis not present

## 2020-07-03 DIAGNOSIS — Z9981 Dependence on supplemental oxygen: Secondary | ICD-10-CM | POA: Diagnosis not present

## 2020-07-03 DIAGNOSIS — N179 Acute kidney failure, unspecified: Secondary | ICD-10-CM | POA: Diagnosis not present

## 2020-07-03 DIAGNOSIS — U071 COVID-19: Secondary | ICD-10-CM | POA: Diagnosis not present

## 2020-07-03 DIAGNOSIS — E0810 Diabetes mellitus due to underlying condition with ketoacidosis without coma: Secondary | ICD-10-CM | POA: Diagnosis not present

## 2020-07-03 DIAGNOSIS — J1282 Pneumonia due to coronavirus disease 2019: Secondary | ICD-10-CM | POA: Diagnosis not present

## 2020-07-03 DIAGNOSIS — R0789 Other chest pain: Secondary | ICD-10-CM | POA: Diagnosis not present

## 2020-07-03 DIAGNOSIS — A4189 Other specified sepsis: Secondary | ICD-10-CM | POA: Diagnosis not present

## 2020-07-03 DIAGNOSIS — R404 Transient alteration of awareness: Secondary | ICD-10-CM | POA: Diagnosis not present

## 2020-07-03 DIAGNOSIS — N390 Urinary tract infection, site not specified: Secondary | ICD-10-CM | POA: Diagnosis not present

## 2020-07-03 DIAGNOSIS — J189 Pneumonia, unspecified organism: Secondary | ICD-10-CM | POA: Diagnosis not present

## 2020-07-03 DIAGNOSIS — J984 Other disorders of lung: Secondary | ICD-10-CM | POA: Diagnosis not present

## 2020-07-03 DIAGNOSIS — N183 Chronic kidney disease, stage 3 unspecified: Secondary | ICD-10-CM | POA: Diagnosis not present

## 2020-07-03 DIAGNOSIS — R0689 Other abnormalities of breathing: Secondary | ICD-10-CM | POA: Diagnosis not present

## 2020-07-03 DIAGNOSIS — R918 Other nonspecific abnormal finding of lung field: Secondary | ICD-10-CM | POA: Diagnosis not present

## 2020-07-03 DIAGNOSIS — J9601 Acute respiratory failure with hypoxia: Secondary | ICD-10-CM | POA: Diagnosis not present

## 2020-07-03 DIAGNOSIS — E1165 Type 2 diabetes mellitus with hyperglycemia: Secondary | ICD-10-CM | POA: Diagnosis not present

## 2020-07-03 DIAGNOSIS — R0602 Shortness of breath: Secondary | ICD-10-CM | POA: Diagnosis not present

## 2020-07-03 DIAGNOSIS — I248 Other forms of acute ischemic heart disease: Secondary | ICD-10-CM | POA: Diagnosis not present

## 2020-07-03 DIAGNOSIS — E1022 Type 1 diabetes mellitus with diabetic chronic kidney disease: Secondary | ICD-10-CM | POA: Diagnosis not present

## 2020-07-03 DIAGNOSIS — A419 Sepsis, unspecified organism: Secondary | ICD-10-CM | POA: Diagnosis not present

## 2020-07-03 DIAGNOSIS — Z794 Long term (current) use of insulin: Secondary | ICD-10-CM | POA: Diagnosis not present

## 2020-07-03 DIAGNOSIS — R6520 Severe sepsis without septic shock: Secondary | ICD-10-CM | POA: Diagnosis not present

## 2020-07-03 DIAGNOSIS — B9620 Unspecified Escherichia coli [E. coli] as the cause of diseases classified elsewhere: Secondary | ICD-10-CM | POA: Diagnosis not present

## 2020-07-03 DIAGNOSIS — E1010 Type 1 diabetes mellitus with ketoacidosis without coma: Secondary | ICD-10-CM | POA: Diagnosis not present

## 2020-07-03 DIAGNOSIS — J159 Unspecified bacterial pneumonia: Secondary | ICD-10-CM | POA: Diagnosis not present

## 2020-07-04 DIAGNOSIS — R918 Other nonspecific abnormal finding of lung field: Secondary | ICD-10-CM | POA: Diagnosis not present

## 2020-07-04 DIAGNOSIS — U071 COVID-19: Secondary | ICD-10-CM | POA: Diagnosis not present

## 2020-07-05 DIAGNOSIS — N179 Acute kidney failure, unspecified: Secondary | ICD-10-CM | POA: Diagnosis not present

## 2020-07-11 ENCOUNTER — Ambulatory Visit: Payer: Medicare HMO | Admitting: Endocrinology

## 2020-07-15 ENCOUNTER — Other Ambulatory Visit: Payer: Self-pay

## 2020-07-15 NOTE — Patient Outreach (Signed)
[facility] [facility]) Care Management  07/15/2020  [name] Jun 01, 1966 [id number]   Referral Date:06/21/20 Referral Source:[employer] Report Facility: [facility]   #4 Outreach Attempt:no answer. Mailbox full.  Plan: RN CM will wait return call if no return call will close case.    [name], RN, MSN [place] Management Care Management Coordinator Direct Line [phone] Cell [phone] Toll Free: [phone]  Fax: [phone]

## 2020-07-19 ENCOUNTER — Other Ambulatory Visit: Payer: Self-pay

## 2020-07-19 NOTE — Patient Outreach (Signed)
[place] [facility]) Care Management  07/19/2020  Jalilah Wiltsie March 12, 1966 [id number]   Patient has not returned multiple calls for follow up.    Plan: RN CM will close case.  [name], RN, MSN [place] Management Care Management Coordinator Direct Line [phone] Cell [phone] Toll Free: [phone]  Fax: [phone]

## 2020-07-25 ENCOUNTER — Telehealth: Payer: Self-pay | Admitting: Endocrinology

## 2020-07-25 ENCOUNTER — Other Ambulatory Visit: Payer: Self-pay | Admitting: Endocrinology

## 2020-07-25 NOTE — Telephone Encounter (Signed)
Forms are on your desk.

## 2020-07-25 NOTE — Telephone Encounter (Signed)
She needs to make a follow-up appointment and keep it

## 2020-07-25 NOTE — Telephone Encounter (Signed)
Patient is scheduled for 08/04/20 - she said she had to cancel last visit due to having COVID.

## 2020-07-25 NOTE — Telephone Encounter (Signed)
Pt called letting us know her pharmacy is going to be sending over a fax regarding a new prescription for her Omnipod but wanted to call us to see if we could do it faster. Refill for Omnipod send to  PHARMACY:  [place], [place] Phone:  [phone]  Fax:  [phone]

## 2020-07-29 ENCOUNTER — Other Ambulatory Visit: Payer: Medicare HMO

## 2020-07-29 ENCOUNTER — Other Ambulatory Visit: Payer: Self-pay

## 2020-07-29 DIAGNOSIS — I10 Essential (primary) hypertension: Secondary | ICD-10-CM

## 2020-07-29 MED ORDER — LOSARTAN POTASSIUM 25 MG PO TABS: 25.0000 mg | ORAL_TABLET | Freq: Every day | ORAL | 3 refills | Status: DC

## 2020-07-29 MED ORDER — ROSUVASTATIN CALCIUM 10 MG PO TABS: 10.0000 mg | ORAL_TABLET | Freq: Every day | ORAL | 3 refills | Status: DC

## 2020-07-29 MED ORDER — FUROSEMIDE 40 MG PO TABS: 40.0000 mg | ORAL_TABLET | Freq: Every day | ORAL | 3 refills | Status: DC

## 2020-07-29 MED ORDER — METOPROLOL TARTRATE 25 MG PO TABS: 12.5000 mg | ORAL_TABLET | Freq: Two times a day (BID) | ORAL | 3 refills | Status: DC

## 2020-08-01 NOTE — Telephone Encounter (Signed)
Forms are on Dr. Ronnie Derby desk

## 2020-08-02 ENCOUNTER — Ambulatory Visit: Payer: Medicare HMO | Admitting: Physician Assistant

## 2020-08-02 NOTE — Progress Notes (Deleted)
Cardiology Office Note:    Date:  08/02/2020   ID:  [name], DOB 05-27-1966, MRN [id number]  PCP:  [name], MD  [facility] Cardiologist:  Mertie Moores, MD *** [facility] Electrophysiologist:  None   Referring MD: [name], MD   Chief Complaint:  No chief complaint on file.    Patient Profile:    [name] is a 55 y.o. female with:   Coronary artery disease   NSTEMI >> S/p CABG in 12/2017 (L-LAD, S-PDA, S-OM)  (HFpEF) heart failure with preserved ejection fraction   Echocardiogram 04/2019: EF 55-60   Diabetes mellitus Type 1  Endocrinologist: Dr. Dwyane Dee  Neuropathy  Gastroparesis  Hypertension   Hyperlipidemia   Anxiety/Depression    Chronic kidney disease   GERD  Hx of COVID-19 pneumonia 06/2020 [facility] [place])   Prior CV studies: Echocardiogram 05/11/2019 EF 55-60, normal RVSF  Pre-CABG Dopplers 01/09/2018 No ICA stenosis bilaterally  History of Present Illness:    [name] was last seen by Dr. Acie Fredrickson in 11/21.  Her furosemide dose was adjusted due to volume excess.         Since that time, she was admitted 1/9-1/22 to [facility] with sepsis secondary to COVID-19 pneumonia and UTI.  Hospitalization was complicated by worsening anemia.  She was placed on iron therapy.  She also developed AKI.  She returns for f/u.  ***  Past Medical History:  Diagnosis Date  . AKI (acute kidney injury) ([place])   . Anemia, iron deficiency   . Anxiety and depression 05/18/2015  . CAD (coronary artery disease), native coronary artery 01/11/2018   s/p CABG - Non-STEMI with severe three-vessel disease noted July 2019  . Depression   . Diabetic gastroparesis ([place])   . Diabetic neuropathy, type I diabetes mellitus ([place]) 05/18/2015  . DKA, type 1 ([place]) 03/18/2016  . Essential hypertension   . Gastroparesis   . GERD (gastroesophageal reflux disease)   . HLD (hyperlipidemia)   . MDD (major depressive disorder),  single episode, severe , no psychosis ([place])   . Tardive dyskinesia   . Vitamin B12 deficiency 08/16/2015    Current Medications: No outpatient medications have been marked as taking for the 08/02/20 encounter (Appointment) with Richardson Dopp T, PA-C.     Allergies:   Anesthetics, amide; Chlorhexidine; Penicillins; Buprenorphine hcl; Encainide; and Metoclopramide   Social History   Tobacco Use  . Smoking status: Never Smoker  . Smokeless tobacco: Never Used  Vaping Use  . Vaping Use: Never used  Substance Use Topics  . Alcohol use: No  . Drug use: Yes    Types: Marijuana    Comment: occ     Family Hx: The patient's family history includes Cystic fibrosis in her mother; Diabetes in her brother; Hypertension in her father and maternal grandmother.  ROS   EKGs/Labs/Other Test Reviewed:    EKG:  EKG is *** ordered today.  The ekg ordered today demonstrates ***  Recent Labs: No results found for requested labs within last 8760 hours.   Recent Lipid Panel Lab Results  Component Value Date/Time   CHOL 121 01/07/2018 05:10 AM   TRIG 101 01/07/2018 05:10 AM   HDL 47 01/07/2018 05:10 AM   CHOLHDL 2.6 01/07/2018 05:10 AM   LDLCALC 54 01/07/2018 05:10 AM      Risk Assessment/Calculations:   {Does this patient have ATRIAL FIBRILLATION?[phone]}  Physical Exam:    VS:  There were no vitals taken for  this visit.    Wt Readings from Last 3 Encounters:  06/13/20 168 lb 9.6 oz (76.5 kg)  05/06/20 165 lb 12.8 oz (75.2 kg)  04/28/20 166 lb 3.2 oz (75.4 kg)     Physical Exam ***  ASSESSMENT & PLAN:    ***  {Are you ordering a CV Procedure (e.g. stress test, cath, DCCV, TEE, etc)?   Press F2        :[id number]}    Dispo:  No follow-ups on file.   Medication Adjustments/Labs and Tests Ordered: Current medicines are reviewed at length with the patient today.  Concerns regarding medicines are outlined above.  Tests Ordered: No orders of the defined types were placed  in this encounter.  Medication Changes: No orders of the defined types were placed in this encounter.   Signed, Richardson Dopp, PA-C  08/02/2020 7:49 AM    [facility] [place], [place], [place]  [address] Phone: [phone]; Fax: [phone]

## 2020-08-03 DIAGNOSIS — E1143 Type 2 diabetes mellitus with diabetic autonomic (poly)neuropathy: Secondary | ICD-10-CM | POA: Diagnosis not present

## 2020-08-03 DIAGNOSIS — K3184 Gastroparesis: Secondary | ICD-10-CM | POA: Diagnosis not present

## 2020-08-04 ENCOUNTER — Ambulatory Visit: Payer: Medicare HMO | Admitting: Endocrinology

## 2020-08-04 ENCOUNTER — Other Ambulatory Visit: Payer: Self-pay

## 2020-08-04 VITALS — BP 140/62 | HR 86 | Ht 61.0 in | Wt 160.8 lb

## 2020-08-04 DIAGNOSIS — K3184 Gastroparesis: Secondary | ICD-10-CM | POA: Diagnosis not present

## 2020-08-04 DIAGNOSIS — E1143 Type 2 diabetes mellitus with diabetic autonomic (poly)neuropathy: Secondary | ICD-10-CM

## 2020-08-04 DIAGNOSIS — E1043 Type 1 diabetes mellitus with diabetic autonomic (poly)neuropathy: Secondary | ICD-10-CM | POA: Diagnosis not present

## 2020-08-04 DIAGNOSIS — E1065 Type 1 diabetes mellitus with hyperglycemia: Secondary | ICD-10-CM

## 2020-08-04 LAB — COMPREHENSIVE METABOLIC PANEL
ALT: 18 U/L (ref 0–35)
AST: 23 U/L (ref 0–37)
Albumin: 3.7 g/dL (ref 3.5–5.2)
Alkaline Phosphatase: 91 U/L (ref 39–117)
BUN: 20 mg/dL (ref 6–23)
CO2: 28 mEq/L (ref 19–32)
Calcium: 9.6 mg/dL (ref 8.4–10.5)
Chloride: 101 mEq/L (ref 96–112)
Creatinine, Ser: 1.37 mg/dL — ABNORMAL HIGH (ref 0.40–1.20)
GFR: 44.17 mL/min — ABNORMAL LOW (ref >60.00)
Glucose, Bld: 165 mg/dL — ABNORMAL HIGH (ref 70–99)
Potassium: 4.8 mEq/L (ref 3.5–5.1)
Sodium: 135 mEq/L (ref 135–145)
Total Bilirubin: 0.5 mg/dL (ref 0.2–1.2)
Total Protein: 7.6 g/dL (ref 6.0–8.3)

## 2020-08-04 LAB — LIPID PANEL
Cholesterol: 157 mg/dL (ref 0–200)
HDL: 62.2 mg/dL (ref >39.00)
LDL Cholesterol: 80 mg/dL (ref 0–99)
NonHDL: 95.18
Total CHOL/HDL Ratio: 3
Triglycerides: 75 mg/dL (ref 0.0–149.0)
VLDL: 15 mg/dL (ref 0.0–40.0)

## 2020-08-04 LAB — POCT GLYCOSYLATED HEMOGLOBIN (HGB A1C): Hemoglobin A1C: 9 % — AB (ref 4.0–5.6)

## 2020-08-04 NOTE — Progress Notes (Signed)
Patient ID: [name], female   DOB: [birth year], 55 y.o.   MRN: [id number]           Reason for Appointment : Follow-up for Type 1 Diabetes  History of Present Illness          Diagnosis: Type 1 diabetes mellitus, date of diagnosis: 1977         Previous history:  She has mostly been followed by primary care physicians for her diabetes with consistently poor control She has had periodic episodes of ketoacidosis Previous insulin regimen:  BASAL insulin: LANTUS 15 units in a.m. MEALTIME insulin: Novolog variable doses: Usually 6-8 at breakfast, 2-3 at lunch and dinner   Recent history:   INSULIN regimen: OmniPod insulin pump since 07/07/2019  Basal rates: Midnight-5 AM = 0. 95 units, 4 AM-6 AM = 0.55, 6 AM-10:30 AM = 0.7, 10:30 AM-4 PM = 1.0 and 4 PM = 1.15 with total 22.75   I/C ratio 1:1;   ISF: 40, target:120 with correction over 120   A1c is 9%, previously was at 6.9   Current management, blood sugar patterns and problems identified:    She continues to be having generally higher blood sugars even after her last visit  She has been hospitalized a couple of times, the last admission for ketoacidosis reportedly but she did not call to make a follow-up appointment until now  She is using the Dexcom but not consistently and her blood sugar patterns are discussed below  She tends to have high readings overnight and even with correction doses blood sugars do not improve  Only last night when she did a correction for the postprandial high reading her blood sugar came down to about 158 temporarily  She says she is afraid to bolus more than about 2 to 3 units for her meals for fear of hypoglycemia, does occasionally bolus 4-5 units for larger meals, usually entering her blood sugar reading at the time of bolus  Dexcom data for the last week or so:  CGM use % of time  98  2-week average/GV  230/26  Time in range       20%  % Time Above 180  80  % Time above 250  35  %  Time Below 70 0     PRE-MEAL Fasting Lunch Dinner Bedtime Overall  Glucose range:       Averages:  222   200  265  230   POST-MEAL PC Breakfast PC Lunch PC Dinner  Glucose range:     Averages:  252   226      CONTINUOUS GLUCOSE MONITORING RECORD data:     HYPERGLYCEMIA appears to be persistent throughout the day and night with only a couple of days when the blood sugars were in the good range either early morning or late afternoon  She tends to have more postprandial spikes after breakfast around 11 AM and also 7 PM  Overnight blood sugars show some variability but fairly consistently averaging over 200  Previous data:   CGM use % of time  67  2-week average  229+/-72  Time in range  25       %  % Time Above 180  75  % Time above 250  37  % Time Below 70  0.2     Symptoms of hypoglycemia: Weakness, sometimes none Treatment of hypoglycemia: Snacks         Self-care: The diet that the patient has been following is:  None  Mealtimes are: Breakfast egg, cheese, sausage, occasionally grits, toast;   Lunch: Sandwich or only snacks, dinner: baked chricken, rice, potatoes                Dietician consultation: Most recent: years ago.         CDE consultation: 04/2018  Wt Readings from Last 3 Encounters:  08/04/20 160 lb 12.8 oz (72.9 kg)  06/13/20 168 lb 9.6 oz (76.5 kg)  05/06/20 165 lb 12.8 oz (75.2 kg)   Diabetes labs:  Lab Results  Component Value Date   HGBA1C 9.0 (A) 08/04/2020   HGBA1C 6.9 (A) 04/26/2020   HGBA1C 8.1 (A) 02/15/2020   Lab Results  Component Value Date   MICROALBUR 3.7 (H) 06/13/2020   LDLCALC 54 01/07/2018   CREATININE 1.33 (H) 05/15/2019    Lab Results  Component Value Date   MICRALBCREAT 4.3 06/13/2020   MICRALBCREAT 17.1 04/28/2018     Allergies as of 08/04/2020      Reactions   Anesthetics, Amide Nausea And Vomiting   Chlorhexidine    Penicillins Diarrhea, Nausea And Vomiting, Other (See Comments)   Has patient had a PCN  reaction causing immediate rash, facial/tongue/throat swelling, SOB or lightheadedness with hypotension: Yes Has patient had a PCN reaction causing severe rash involving mucus membranes or skin necrosis: No Has patient had a PCN reaction that required hospitalization No Has patient had a PCN reaction occurring within the last 10 years: Yes  If all of the above answers are "NO", then may proceed with Cephalosporin use.   Buprenorphine Hcl Rash   Encainide Nausea And Vomiting   Metoclopramide Other (See Comments)   Currently no side effects, previously reportedly had dystonia, muscle rigidity, slurred speech, tongue swelling      Medication List       Accurate as of August 04, 2020 10:45 AM. If you have any questions, ask your nurse or doctor.        aspirin EC 81 MG tablet Take 1 tablet (81 mg total) by mouth daily.   busPIRone 10 MG tablet Commonly known as: BUSPAR Take 1 tablet (10 mg total) by mouth 3 (three) times daily.   Contour Test test strip Generic drug: glucose blood 1 each by Other route 4 (four) times daily. E11.9   docusate sodium 100 MG capsule Commonly known as: Colace Take 1 capsule (100 mg total) by mouth daily as needed for mild constipation.   dronabinol 5 MG capsule Commonly known as: MARINOL Take 1 capsule by mouth 2 (two) times daily before a meal.   furosemide 40 MG tablet Commonly known as: LASIX Take 1 tablet (40 mg total) by mouth daily.   insulin aspart 100 UNIT/ML injection Commonly known as: novoLOG Inject 100 Units into the skin 3 (three) times daily before meals. Use max of 100 units per day in insulin pump   insulin lispro 100 UNIT/ML injection Commonly known as: HUMALOG Inject 100 Units into the skin 3 (three) times daily before meals. Use max 100 units in insulin pump per day   Insulin Syringe-Needle U-100 31G X 5/16" 0.5 ML Misc Commonly known as: BD Insulin Syringe Ultrafine 1 each by Does not apply route 4 (four) times daily.    Iron 325 (65 Fe) MG Tabs Take 1 tablet by mouth every other day.   loperamide 2 MG capsule Commonly known as: IMODIUM Take 1 capsule (2 mg total) by mouth 3 (three) times daily as needed for diarrhea or loose  stools.   losartan 25 MG tablet Commonly known as: COZAAR Take 1 tablet (25 mg total) by mouth daily.   metoCLOPramide 10 MG tablet Commonly known as: REGLAN TAKE 1 TABLET(10 MG) BY MOUTH THREE TIMES DAILY BEFORE MEALS   metoprolol tartrate 25 MG tablet Commonly known as: LOPRESSOR Take 0.5 tablets (12.5 mg total) by mouth 2 (two) times daily.   Plum Kit by Does not apply route.   OmniPod Dash 5 Pack Pods Misc CHANGE DASH PODS EVERY 48 HOURS AS DIRECTED   ondansetron 4 MG tablet Commonly known as: Zofran Take 1 tablet (4 mg total) by mouth every 8 (eight) hours as needed for nausea or vomiting.   pantoprazole 40 MG tablet Commonly known as: PROTONIX Take 1 tablet (40 mg total) by mouth daily.   polyethylene glycol 17 g packet Commonly known as: MIRALAX / GLYCOLAX Take 17 g by mouth 2 (two) times daily as needed for moderate constipation.   pregabalin 150 MG capsule Commonly known as: Lyrica Take 1 capsule (150 mg total) by mouth 2 (two) times daily.   rosuvastatin 10 MG tablet Commonly known as: CRESTOR Take 1 tablet (10 mg total) by mouth daily.   vitamin C 250 MG tablet Commonly known as: ASCORBIC ACID Take 1 tablet by mouth every other day.       Allergies:  Allergies  Allergen Reactions  . Anesthetics, Amide Nausea And Vomiting  . Chlorhexidine   . Penicillins Diarrhea, Nausea And Vomiting and Other (See Comments)    Has patient had a PCN reaction causing immediate rash, facial/tongue/throat swelling, SOB or lightheadedness with hypotension: Yes Has patient had a PCN reaction causing severe rash involving mucus membranes or skin necrosis: No Has patient had a PCN reaction that required hospitalization No Has patient had a PCN  reaction occurring within the last 10 years: Yes  If all of the above answers are "NO", then may proceed with Cephalosporin use.   . Buprenorphine Hcl Rash  . Encainide Nausea And Vomiting  . Metoclopramide Other (See Comments)    Currently no side effects, previously reportedly had dystonia, muscle rigidity, slurred speech, tongue swelling    Past Medical History:  Diagnosis Date  . AKI (acute kidney injury) ([place])   . Anemia, iron deficiency   . Anxiety and depression 05/18/2015  . CAD (coronary artery disease), native coronary artery 01/11/2018   s/p CABG - Non-STEMI with severe three-vessel disease noted July 2019  . Depression   . Diabetic gastroparesis ([place])   . Diabetic neuropathy, type I diabetes mellitus ([place]) 05/18/2015  . DKA, type 1 ([place]) 03/18/2016  . Essential hypertension   . Gastroparesis   . GERD (gastroesophageal reflux disease)   . HLD (hyperlipidemia)   . MDD (major depressive disorder), single episode, severe , no psychosis ([place])   . Tardive dyskinesia   . Vitamin B12 deficiency 08/16/2015    Past Surgical History:  Procedure Laterality Date  . CARDIAC SURGERY    . COLONOSCOPY  09/27/2014   at [facility]  . CORONARY ARTERY BYPASS GRAFT N/A 01/14/2018   Procedure: CORONARY ARTERY BYPASS GRAFTING (CABG)X3, RIGHT AND LEFT SAPHENOUS VEIN HARVEST, MAMMARY TAKE DOWN. MAMMARY TO LAD, SVG TO PD, SVG TO DISTAL CIRC.;  Surgeon: Grace Isaac, MD;  Location: [place];  Service: Open Heart Surgery;  Laterality: N/A;  . ESOPHAGOGASTRODUODENOSCOPY  09/27/2014   at [facility], Dr Rolan Lipa. biospy neg for celiac, neg for H pylori.   . EYE SURGERY    .  gailstones    . INTRAMEDULLARY (IM) NAIL INTERTROCHANTERIC Right 05/10/2019   Procedure: INTRAMEDULLARY (IM) NAIL INTERTROCHANTRIC;  Surgeon: Lovell Sheehan, MD;  Location: [facility];  Service: Orthopedics;  Laterality: Right;  . IR FLUORO GUIDE CV LINE RIGHT  02/01/2017  . IR FLUORO GUIDE CV LINE RIGHT  03/06/2017  . IR FLUORO  GUIDE CV LINE RIGHT  03/25/2017  . IR GENERIC HISTORICAL  01/24/2016   IR FLUORO GUIDE CV LINE RIGHT 01/24/2016 Darrell K Allred, PA-C [facility]  . IR GENERIC HISTORICAL  01/24/2016   IR US GUIDE VASC ACCESS RIGHT 01/24/2016 Darrell K Allred, PA-C [facility]  . IR US GUIDE VASC ACCESS RIGHT  02/01/2017  . IR US GUIDE VASC ACCESS RIGHT  03/06/2017  . IR US GUIDE VASC ACCESS RIGHT  03/25/2017  . IR US GUIDE VASC ACCESS RIGHT  01/20/2019  . IR VENIPUNCTURE 78YRS/OLDER BY MD  01/20/2019  . LEFT HEART CATH AND CORONARY ANGIOGRAPHY N/A 01/07/2018   Procedure: LEFT HEART CATH AND CORONARY ANGIOGRAPHY;  Surgeon: Troy Sine, MD;  Location: [facility];  Service: Cardiovascular;  Laterality: N/A;  . POSTERIOR VITRECTOMY AND MEMBRANE PEEL-LEFT EYE  09/28/2002  . POSTERIOR VITRECTOMY AND MEMBRANE PEEL-RIGHT EYE  03/16/2002  . RETINAL DETACHMENT SURGERY    . TEE WITHOUT CARDIOVERSION N/A 01/14/2018   Procedure: TRANSESOPHAGEAL ECHOCARDIOGRAM (TEE);  Surgeon: Grace Isaac, MD;  Location: [place];  Service: Open Heart Surgery;  Laterality: N/A;    Family History  Problem Relation Age of Onset  . Cystic fibrosis Mother   . Hypertension Father   . Diabetes Brother   . Hypertension Maternal Grandmother     Social History:  reports that she has never smoked. She has never used smokeless tobacco. She reports current drug use. Drug: Marijuana. She reports that she does not drink alcohol.      Review of Systems      Lipids: She had been taking Crestor 10 mg prescribed by cardiologist but no recent labs available  Lab Results  Component Value Date   CHOL 121 01/07/2018   HDL 47 01/07/2018   LDLCALC 54 01/07/2018   TRIG 101 01/07/2018   CHOLHDL 2.6 01/07/2018     DIABETES COMPLICATIONS: Gastroparesis treated with Phenergan as needed Also on Reglan regularly She saw her gastroenterologist and is apparently going on a new medication   On Lyrica 150 mg for peripheral neuropathy with pain  with generally good relief    Physical Examination:  BP 140/62 (BP Location: Right Arm, Patient Position: Sitting, Cuff Size: Normal)   Pulse 86   Ht 5' 1"  (1.549 m)   Wt 160 lb 12.8 oz (72.9 kg)   SpO2 95%   BMI 30.38 kg/m       ASSESSMENT:  Diabetes type 1, long-standing with multiple complications  She is on OmniPod insulin pump with Humalog  See history of present illness for detailed discussion of current diabetes management, blood sugar patterns and problems identified  Her A1c is 9%  As above her blood sugars are quite out of control persistently and she is getting into episodes of DKA also  She does appear to be more insulin resistant and also not getting enough boluses for meals Likely needs basal increase throughout the day and night and also increase correction doses of insulin  Currently she is somewhat afraid of doing enough boluses for her meals also  LIPIDS: On Crestor, no recent labs available, Will need follow-up today  PLAN:   She will try to bolus 4 to 5 units at least for her meals and if not comfortable doing the whole bolus at 1 time she can split it up Increased boluses for higher carbohydrate intake  She will try to use the Dexcom consistently  Basal rates changes in the office as follows 12 AM = 1.10, 4 AM = 0.7, 6 AM = 0.85, 10:30 AM = 1.2 and 4 PM = 1.4 Correction 1: 35 She will call if she has persistently high readings or tendency to hypoglycemia No regular follow-up  There are no Patient Instructions on file for this visit.    Elayne Snare 08/04/2020, 10:45 AM     Note: This note was prepared with Dragon voice recognition system technology. Any transcriptional errors that result from this process are unintentional.

## 2020-08-08 DIAGNOSIS — K219 Gastro-esophageal reflux disease without esophagitis: Secondary | ICD-10-CM | POA: Diagnosis not present

## 2020-08-08 DIAGNOSIS — Z8616 Personal history of COVID-19: Secondary | ICD-10-CM | POA: Diagnosis not present

## 2020-08-08 DIAGNOSIS — R112 Nausea with vomiting, unspecified: Secondary | ICD-10-CM | POA: Diagnosis not present

## 2020-08-08 DIAGNOSIS — R404 Transient alteration of awareness: Secondary | ICD-10-CM | POA: Diagnosis not present

## 2020-08-08 DIAGNOSIS — I700 Atherosclerosis of aorta: Secondary | ICD-10-CM | POA: Diagnosis not present

## 2020-08-08 DIAGNOSIS — R0689 Other abnormalities of breathing: Secondary | ICD-10-CM | POA: Diagnosis not present

## 2020-08-08 DIAGNOSIS — S7291XA Unspecified fracture of right femur, initial encounter for closed fracture: Secondary | ICD-10-CM | POA: Diagnosis not present

## 2020-08-08 DIAGNOSIS — R1110 Vomiting, unspecified: Secondary | ICD-10-CM | POA: Diagnosis not present

## 2020-08-08 DIAGNOSIS — I129 Hypertensive chronic kidney disease with stage 1 through stage 4 chronic kidney disease, or unspecified chronic kidney disease: Secondary | ICD-10-CM | POA: Diagnosis not present

## 2020-08-08 DIAGNOSIS — J849 Interstitial pulmonary disease, unspecified: Secondary | ICD-10-CM | POA: Diagnosis not present

## 2020-08-08 DIAGNOSIS — R000 Tachycardia, unspecified: Secondary | ICD-10-CM | POA: Diagnosis not present

## 2020-08-08 DIAGNOSIS — R0682 Tachypnea, not elsewhere classified: Secondary | ICD-10-CM | POA: Diagnosis not present

## 2020-08-08 DIAGNOSIS — I10 Essential (primary) hypertension: Secondary | ICD-10-CM | POA: Diagnosis not present

## 2020-08-08 DIAGNOSIS — E872 Acidosis: Secondary | ICD-10-CM | POA: Diagnosis not present

## 2020-08-08 DIAGNOSIS — Z20822 Contact with and (suspected) exposure to covid-19: Secondary | ICD-10-CM | POA: Diagnosis not present

## 2020-08-08 DIAGNOSIS — N183 Chronic kidney disease, stage 3 unspecified: Secondary | ICD-10-CM | POA: Diagnosis not present

## 2020-08-08 DIAGNOSIS — K3184 Gastroparesis: Secondary | ICD-10-CM | POA: Diagnosis not present

## 2020-08-08 DIAGNOSIS — R4182 Altered mental status, unspecified: Secondary | ICD-10-CM | POA: Diagnosis not present

## 2020-08-08 DIAGNOSIS — Z951 Presence of aortocoronary bypass graft: Secondary | ICD-10-CM | POA: Diagnosis not present

## 2020-08-08 DIAGNOSIS — E1022 Type 1 diabetes mellitus with diabetic chronic kidney disease: Secondary | ICD-10-CM | POA: Diagnosis not present

## 2020-08-08 DIAGNOSIS — E1165 Type 2 diabetes mellitus with hyperglycemia: Secondary | ICD-10-CM | POA: Diagnosis not present

## 2020-08-08 DIAGNOSIS — E1010 Type 1 diabetes mellitus with ketoacidosis without coma: Secondary | ICD-10-CM | POA: Diagnosis not present

## 2020-08-08 DIAGNOSIS — R110 Nausea: Secondary | ICD-10-CM | POA: Diagnosis not present

## 2020-08-08 DIAGNOSIS — J189 Pneumonia, unspecified organism: Secondary | ICD-10-CM | POA: Diagnosis not present

## 2020-08-08 DIAGNOSIS — N179 Acute kidney failure, unspecified: Secondary | ICD-10-CM | POA: Diagnosis not present

## 2020-08-08 DIAGNOSIS — E785 Hyperlipidemia, unspecified: Secondary | ICD-10-CM | POA: Diagnosis not present

## 2020-08-08 DIAGNOSIS — G9340 Encephalopathy, unspecified: Secondary | ICD-10-CM | POA: Diagnosis not present

## 2020-08-08 DIAGNOSIS — E1043 Type 1 diabetes mellitus with diabetic autonomic (poly)neuropathy: Secondary | ICD-10-CM | POA: Diagnosis not present

## 2020-08-08 DIAGNOSIS — N200 Calculus of kidney: Secondary | ICD-10-CM | POA: Diagnosis not present

## 2020-08-08 DIAGNOSIS — G9341 Metabolic encephalopathy: Secondary | ICD-10-CM | POA: Diagnosis not present

## 2020-08-08 DIAGNOSIS — E109 Type 1 diabetes mellitus without complications: Secondary | ICD-10-CM | POA: Diagnosis not present

## 2020-08-08 DIAGNOSIS — Z9641 Presence of insulin pump (external) (internal): Secondary | ICD-10-CM | POA: Diagnosis not present

## 2020-08-08 DIAGNOSIS — I2510 Atherosclerotic heart disease of native coronary artery without angina pectoris: Secondary | ICD-10-CM | POA: Diagnosis not present

## 2020-08-08 DIAGNOSIS — R0602 Shortness of breath: Secondary | ICD-10-CM | POA: Diagnosis not present

## 2020-08-08 DIAGNOSIS — D259 Leiomyoma of uterus, unspecified: Secondary | ICD-10-CM | POA: Diagnosis not present

## 2020-08-08 DIAGNOSIS — Z794 Long term (current) use of insulin: Secondary | ICD-10-CM | POA: Diagnosis not present

## 2020-09-12 ENCOUNTER — Ambulatory Visit: Payer: Medicare HMO | Admitting: Endocrinology

## 2020-09-14 DIAGNOSIS — R6889 Other general symptoms and signs: Secondary | ICD-10-CM | POA: Diagnosis not present

## 2020-09-14 DIAGNOSIS — L89892 Pressure ulcer of other site, stage 2: Secondary | ICD-10-CM | POA: Diagnosis not present

## 2020-09-14 DIAGNOSIS — E11621 Type 2 diabetes mellitus with foot ulcer: Secondary | ICD-10-CM | POA: Diagnosis not present

## 2020-09-15 ENCOUNTER — Ambulatory Visit: Payer: Medicare HMO

## 2020-09-20 DIAGNOSIS — E1065 Type 1 diabetes mellitus with hyperglycemia: Secondary | ICD-10-CM | POA: Diagnosis not present

## 2020-09-21 DIAGNOSIS — L89620 Pressure ulcer of left heel, unstageable: Secondary | ICD-10-CM | POA: Diagnosis not present

## 2020-09-21 DIAGNOSIS — L89629 Pressure ulcer of left heel, unspecified stage: Secondary | ICD-10-CM | POA: Diagnosis not present

## 2020-09-21 DIAGNOSIS — R6889 Other general symptoms and signs: Secondary | ICD-10-CM | POA: Diagnosis not present

## 2020-09-21 DIAGNOSIS — L89623 Pressure ulcer of left heel, stage 3: Secondary | ICD-10-CM | POA: Diagnosis not present

## 2020-09-25 DIAGNOSIS — F418 Other specified anxiety disorders: Secondary | ICD-10-CM | POA: Diagnosis not present

## 2020-09-25 DIAGNOSIS — S82191A Other fracture of upper end of right tibia, initial encounter for closed fracture: Secondary | ICD-10-CM | POA: Diagnosis not present

## 2020-09-25 DIAGNOSIS — Z96698 Presence of other orthopedic joint implants: Secondary | ICD-10-CM | POA: Diagnosis not present

## 2020-09-25 DIAGNOSIS — S82141A Displaced bicondylar fracture of right tibia, initial encounter for closed fracture: Secondary | ICD-10-CM | POA: Diagnosis not present

## 2020-09-25 DIAGNOSIS — Z86718 Personal history of other venous thrombosis and embolism: Secondary | ICD-10-CM | POA: Diagnosis not present

## 2020-09-25 DIAGNOSIS — I959 Hypotension, unspecified: Secondary | ICD-10-CM | POA: Diagnosis not present

## 2020-09-25 DIAGNOSIS — R41841 Cognitive communication deficit: Secondary | ICD-10-CM | POA: Diagnosis not present

## 2020-09-25 DIAGNOSIS — E1021 Type 1 diabetes mellitus with diabetic nephropathy: Secondary | ICD-10-CM | POA: Diagnosis not present

## 2020-09-25 DIAGNOSIS — R279 Unspecified lack of coordination: Secondary | ICD-10-CM | POA: Diagnosis not present

## 2020-09-25 DIAGNOSIS — E559 Vitamin D deficiency, unspecified: Secondary | ICD-10-CM | POA: Diagnosis not present

## 2020-09-25 DIAGNOSIS — M2500 Hemarthrosis, unspecified joint: Secondary | ICD-10-CM | POA: Diagnosis not present

## 2020-09-25 DIAGNOSIS — N1831 Chronic kidney disease, stage 3a: Secondary | ICD-10-CM | POA: Diagnosis not present

## 2020-09-25 DIAGNOSIS — N179 Acute kidney failure, unspecified: Secondary | ICD-10-CM | POA: Diagnosis not present

## 2020-09-25 DIAGNOSIS — S82401A Unspecified fracture of shaft of right fibula, initial encounter for closed fracture: Secondary | ICD-10-CM | POA: Diagnosis not present

## 2020-09-25 DIAGNOSIS — E1022 Type 1 diabetes mellitus with diabetic chronic kidney disease: Secondary | ICD-10-CM | POA: Diagnosis not present

## 2020-09-25 DIAGNOSIS — E669 Obesity, unspecified: Secondary | ICD-10-CM | POA: Diagnosis not present

## 2020-09-25 DIAGNOSIS — Y999 Unspecified external cause status: Secondary | ICD-10-CM | POA: Diagnosis not present

## 2020-09-25 DIAGNOSIS — S82401D Unspecified fracture of shaft of right fibula, subsequent encounter for closed fracture with routine healing: Secondary | ICD-10-CM | POA: Diagnosis not present

## 2020-09-25 DIAGNOSIS — S82201A Unspecified fracture of shaft of right tibia, initial encounter for closed fracture: Secondary | ICD-10-CM | POA: Diagnosis not present

## 2020-09-25 DIAGNOSIS — R2681 Unsteadiness on feet: Secondary | ICD-10-CM | POA: Diagnosis not present

## 2020-09-25 DIAGNOSIS — I129 Hypertensive chronic kidney disease with stage 1 through stage 4 chronic kidney disease, or unspecified chronic kidney disease: Secondary | ICD-10-CM | POA: Diagnosis not present

## 2020-09-25 DIAGNOSIS — E1043 Type 1 diabetes mellitus with diabetic autonomic (poly)neuropathy: Secondary | ICD-10-CM | POA: Diagnosis not present

## 2020-09-25 DIAGNOSIS — K5900 Constipation, unspecified: Secondary | ICD-10-CM | POA: Diagnosis not present

## 2020-09-25 DIAGNOSIS — K3184 Gastroparesis: Secondary | ICD-10-CM | POA: Diagnosis not present

## 2020-09-25 DIAGNOSIS — M25561 Pain in right knee: Secondary | ICD-10-CM | POA: Diagnosis not present

## 2020-09-25 DIAGNOSIS — S82121A Displaced fracture of lateral condyle of right tibia, initial encounter for closed fracture: Secondary | ICD-10-CM | POA: Diagnosis not present

## 2020-09-25 DIAGNOSIS — E539 Vitamin B deficiency, unspecified: Secondary | ICD-10-CM | POA: Diagnosis not present

## 2020-09-25 DIAGNOSIS — S82251A Displaced comminuted fracture of shaft of right tibia, initial encounter for closed fracture: Secondary | ICD-10-CM | POA: Diagnosis not present

## 2020-09-25 DIAGNOSIS — Z794 Long term (current) use of insulin: Secondary | ICD-10-CM | POA: Diagnosis not present

## 2020-09-25 DIAGNOSIS — Z20822 Contact with and (suspected) exposure to covid-19: Secondary | ICD-10-CM | POA: Diagnosis not present

## 2020-09-25 DIAGNOSIS — E1042 Type 1 diabetes mellitus with diabetic polyneuropathy: Secondary | ICD-10-CM | POA: Diagnosis not present

## 2020-09-25 DIAGNOSIS — S82451A Displaced comminuted fracture of shaft of right fibula, initial encounter for closed fracture: Secondary | ICD-10-CM | POA: Diagnosis not present

## 2020-09-25 DIAGNOSIS — Z043 Encounter for examination and observation following other accident: Secondary | ICD-10-CM | POA: Diagnosis not present

## 2020-09-25 DIAGNOSIS — S82831A Other fracture of upper and lower end of right fibula, initial encounter for closed fracture: Secondary | ICD-10-CM | POA: Diagnosis not present

## 2020-09-25 DIAGNOSIS — Z6832 Body mass index (BMI) 32.0-32.9, adult: Secondary | ICD-10-CM | POA: Diagnosis not present

## 2020-09-25 DIAGNOSIS — R0602 Shortness of breath: Secondary | ICD-10-CM | POA: Diagnosis not present

## 2020-09-25 DIAGNOSIS — Z743 Need for continuous supervision: Secondary | ICD-10-CM | POA: Diagnosis not present

## 2020-09-25 DIAGNOSIS — Z9181 History of falling: Secondary | ICD-10-CM | POA: Diagnosis not present

## 2020-09-25 DIAGNOSIS — S82141D Displaced bicondylar fracture of right tibia, subsequent encounter for closed fracture with routine healing: Secondary | ICD-10-CM | POA: Diagnosis not present

## 2020-09-25 DIAGNOSIS — W1839XA Other fall on same level, initial encounter: Secondary | ICD-10-CM | POA: Diagnosis not present

## 2020-09-25 DIAGNOSIS — J189 Pneumonia, unspecified organism: Secondary | ICD-10-CM | POA: Diagnosis not present

## 2020-09-25 DIAGNOSIS — W19XXXA Unspecified fall, initial encounter: Secondary | ICD-10-CM | POA: Diagnosis not present

## 2020-09-25 DIAGNOSIS — R7989 Other specified abnormal findings of blood chemistry: Secondary | ICD-10-CM | POA: Diagnosis not present

## 2020-09-25 DIAGNOSIS — D631 Anemia in chronic kidney disease: Secondary | ICD-10-CM | POA: Diagnosis not present

## 2020-09-25 DIAGNOSIS — Y9301 Activity, walking, marching and hiking: Secondary | ICD-10-CM | POA: Diagnosis not present

## 2020-09-25 DIAGNOSIS — G8918 Other acute postprocedural pain: Secondary | ICD-10-CM | POA: Diagnosis not present

## 2020-09-25 DIAGNOSIS — T1490XA Injury, unspecified, initial encounter: Secondary | ICD-10-CM | POA: Diagnosis not present

## 2020-09-25 DIAGNOSIS — M6281 Muscle weakness (generalized): Secondary | ICD-10-CM | POA: Diagnosis not present

## 2020-09-25 DIAGNOSIS — N183 Chronic kidney disease, stage 3 unspecified: Secondary | ICD-10-CM | POA: Diagnosis not present

## 2020-09-26 ENCOUNTER — Ambulatory Visit: Payer: Medicare HMO | Attending: Internal Medicine | Admitting: Physical Therapy

## 2020-09-26 DIAGNOSIS — N179 Acute kidney failure, unspecified: Secondary | ICD-10-CM | POA: Diagnosis not present

## 2020-09-26 DIAGNOSIS — M25561 Pain in right knee: Secondary | ICD-10-CM | POA: Diagnosis not present

## 2020-09-26 DIAGNOSIS — W19XXXA Unspecified fall, initial encounter: Secondary | ICD-10-CM | POA: Diagnosis not present

## 2020-09-26 DIAGNOSIS — E559 Vitamin D deficiency, unspecified: Secondary | ICD-10-CM | POA: Diagnosis not present

## 2020-09-26 DIAGNOSIS — E1022 Type 1 diabetes mellitus with diabetic chronic kidney disease: Secondary | ICD-10-CM | POA: Diagnosis not present

## 2020-09-26 DIAGNOSIS — K3184 Gastroparesis: Secondary | ICD-10-CM | POA: Diagnosis not present

## 2020-09-26 DIAGNOSIS — S82401A Unspecified fracture of shaft of right fibula, initial encounter for closed fracture: Secondary | ICD-10-CM | POA: Diagnosis not present

## 2020-09-26 DIAGNOSIS — I129 Hypertensive chronic kidney disease with stage 1 through stage 4 chronic kidney disease, or unspecified chronic kidney disease: Secondary | ICD-10-CM | POA: Diagnosis not present

## 2020-09-26 DIAGNOSIS — E1043 Type 1 diabetes mellitus with diabetic autonomic (poly)neuropathy: Secondary | ICD-10-CM | POA: Diagnosis not present

## 2020-09-26 DIAGNOSIS — D631 Anemia in chronic kidney disease: Secondary | ICD-10-CM | POA: Diagnosis not present

## 2020-09-26 DIAGNOSIS — N183 Chronic kidney disease, stage 3 unspecified: Secondary | ICD-10-CM | POA: Diagnosis not present

## 2020-09-26 DIAGNOSIS — S82121A Displaced fracture of lateral condyle of right tibia, initial encounter for closed fracture: Secondary | ICD-10-CM | POA: Diagnosis not present

## 2020-09-26 DIAGNOSIS — S82201A Unspecified fracture of shaft of right tibia, initial encounter for closed fracture: Secondary | ICD-10-CM | POA: Diagnosis not present

## 2020-09-27 DIAGNOSIS — S82831A Other fracture of upper and lower end of right fibula, initial encounter for closed fracture: Secondary | ICD-10-CM | POA: Diagnosis not present

## 2020-09-27 DIAGNOSIS — E539 Vitamin B deficiency, unspecified: Secondary | ICD-10-CM | POA: Diagnosis not present

## 2020-09-27 DIAGNOSIS — I129 Hypertensive chronic kidney disease with stage 1 through stage 4 chronic kidney disease, or unspecified chronic kidney disease: Secondary | ICD-10-CM | POA: Diagnosis not present

## 2020-09-27 DIAGNOSIS — S82141A Displaced bicondylar fracture of right tibia, initial encounter for closed fracture: Secondary | ICD-10-CM | POA: Diagnosis not present

## 2020-09-27 DIAGNOSIS — G8918 Other acute postprocedural pain: Secondary | ICD-10-CM | POA: Diagnosis not present

## 2020-09-27 DIAGNOSIS — W19XXXA Unspecified fall, initial encounter: Secondary | ICD-10-CM | POA: Diagnosis not present

## 2020-09-27 DIAGNOSIS — E1022 Type 1 diabetes mellitus with diabetic chronic kidney disease: Secondary | ICD-10-CM | POA: Diagnosis not present

## 2020-09-27 DIAGNOSIS — M25561 Pain in right knee: Secondary | ICD-10-CM | POA: Diagnosis not present

## 2020-09-27 DIAGNOSIS — S82401A Unspecified fracture of shaft of right fibula, initial encounter for closed fracture: Secondary | ICD-10-CM | POA: Diagnosis not present

## 2020-09-27 DIAGNOSIS — E669 Obesity, unspecified: Secondary | ICD-10-CM | POA: Diagnosis not present

## 2020-09-27 DIAGNOSIS — S82201A Unspecified fracture of shaft of right tibia, initial encounter for closed fracture: Secondary | ICD-10-CM | POA: Diagnosis not present

## 2020-09-27 DIAGNOSIS — E1021 Type 1 diabetes mellitus with diabetic nephropathy: Secondary | ICD-10-CM | POA: Diagnosis not present

## 2020-09-27 DIAGNOSIS — E1042 Type 1 diabetes mellitus with diabetic polyneuropathy: Secondary | ICD-10-CM | POA: Diagnosis not present

## 2020-09-27 DIAGNOSIS — R7989 Other specified abnormal findings of blood chemistry: Secondary | ICD-10-CM | POA: Diagnosis not present

## 2020-09-27 DIAGNOSIS — N1831 Chronic kidney disease, stage 3a: Secondary | ICD-10-CM | POA: Diagnosis not present

## 2020-09-27 DIAGNOSIS — Z6832 Body mass index (BMI) 32.0-32.9, adult: Secondary | ICD-10-CM | POA: Diagnosis not present

## 2020-09-27 DIAGNOSIS — N183 Chronic kidney disease, stage 3 unspecified: Secondary | ICD-10-CM | POA: Diagnosis not present

## 2020-09-28 DIAGNOSIS — W19XXXA Unspecified fall, initial encounter: Secondary | ICD-10-CM | POA: Diagnosis not present

## 2020-09-28 DIAGNOSIS — Z96698 Presence of other orthopedic joint implants: Secondary | ICD-10-CM | POA: Diagnosis not present

## 2020-09-28 DIAGNOSIS — E669 Obesity, unspecified: Secondary | ICD-10-CM | POA: Diagnosis not present

## 2020-09-28 DIAGNOSIS — Z6832 Body mass index (BMI) 32.0-32.9, adult: Secondary | ICD-10-CM | POA: Diagnosis not present

## 2020-09-28 DIAGNOSIS — N183 Chronic kidney disease, stage 3 unspecified: Secondary | ICD-10-CM | POA: Diagnosis not present

## 2020-09-28 DIAGNOSIS — E1022 Type 1 diabetes mellitus with diabetic chronic kidney disease: Secondary | ICD-10-CM | POA: Diagnosis not present

## 2020-09-28 DIAGNOSIS — I129 Hypertensive chronic kidney disease with stage 1 through stage 4 chronic kidney disease, or unspecified chronic kidney disease: Secondary | ICD-10-CM | POA: Diagnosis not present

## 2020-09-28 DIAGNOSIS — E539 Vitamin B deficiency, unspecified: Secondary | ICD-10-CM | POA: Diagnosis not present

## 2020-09-28 DIAGNOSIS — E559 Vitamin D deficiency, unspecified: Secondary | ICD-10-CM | POA: Diagnosis not present

## 2020-09-29 DIAGNOSIS — E559 Vitamin D deficiency, unspecified: Secondary | ICD-10-CM | POA: Diagnosis not present

## 2020-09-29 DIAGNOSIS — Z96698 Presence of other orthopedic joint implants: Secondary | ICD-10-CM | POA: Diagnosis not present

## 2020-09-29 DIAGNOSIS — N183 Chronic kidney disease, stage 3 unspecified: Secondary | ICD-10-CM | POA: Diagnosis not present

## 2020-09-29 DIAGNOSIS — I129 Hypertensive chronic kidney disease with stage 1 through stage 4 chronic kidney disease, or unspecified chronic kidney disease: Secondary | ICD-10-CM | POA: Diagnosis not present

## 2020-09-29 DIAGNOSIS — Z6832 Body mass index (BMI) 32.0-32.9, adult: Secondary | ICD-10-CM | POA: Diagnosis not present

## 2020-09-29 DIAGNOSIS — E669 Obesity, unspecified: Secondary | ICD-10-CM | POA: Diagnosis not present

## 2020-09-29 DIAGNOSIS — W19XXXA Unspecified fall, initial encounter: Secondary | ICD-10-CM | POA: Diagnosis not present

## 2020-09-29 DIAGNOSIS — E1022 Type 1 diabetes mellitus with diabetic chronic kidney disease: Secondary | ICD-10-CM | POA: Diagnosis not present

## 2020-09-29 DIAGNOSIS — E539 Vitamin B deficiency, unspecified: Secondary | ICD-10-CM | POA: Diagnosis not present

## 2020-09-30 DIAGNOSIS — K5900 Constipation, unspecified: Secondary | ICD-10-CM | POA: Diagnosis not present

## 2020-09-30 DIAGNOSIS — R0602 Shortness of breath: Secondary | ICD-10-CM | POA: Diagnosis not present

## 2020-09-30 DIAGNOSIS — Z6832 Body mass index (BMI) 32.0-32.9, adult: Secondary | ICD-10-CM | POA: Diagnosis not present

## 2020-09-30 DIAGNOSIS — E539 Vitamin B deficiency, unspecified: Secondary | ICD-10-CM | POA: Diagnosis not present

## 2020-09-30 DIAGNOSIS — E669 Obesity, unspecified: Secondary | ICD-10-CM | POA: Diagnosis not present

## 2020-09-30 DIAGNOSIS — Z96698 Presence of other orthopedic joint implants: Secondary | ICD-10-CM | POA: Diagnosis not present

## 2020-09-30 DIAGNOSIS — E1022 Type 1 diabetes mellitus with diabetic chronic kidney disease: Secondary | ICD-10-CM | POA: Diagnosis not present

## 2020-09-30 DIAGNOSIS — I129 Hypertensive chronic kidney disease with stage 1 through stage 4 chronic kidney disease, or unspecified chronic kidney disease: Secondary | ICD-10-CM | POA: Diagnosis not present

## 2020-09-30 DIAGNOSIS — N183 Chronic kidney disease, stage 3 unspecified: Secondary | ICD-10-CM | POA: Diagnosis not present

## 2020-09-30 DIAGNOSIS — E559 Vitamin D deficiency, unspecified: Secondary | ICD-10-CM | POA: Diagnosis not present

## 2020-09-30 DIAGNOSIS — W19XXXA Unspecified fall, initial encounter: Secondary | ICD-10-CM | POA: Diagnosis not present

## 2020-10-01 DIAGNOSIS — W19XXXA Unspecified fall, initial encounter: Secondary | ICD-10-CM | POA: Diagnosis not present

## 2020-10-01 DIAGNOSIS — E1022 Type 1 diabetes mellitus with diabetic chronic kidney disease: Secondary | ICD-10-CM | POA: Diagnosis not present

## 2020-10-01 DIAGNOSIS — E559 Vitamin D deficiency, unspecified: Secondary | ICD-10-CM | POA: Diagnosis not present

## 2020-10-01 DIAGNOSIS — E669 Obesity, unspecified: Secondary | ICD-10-CM | POA: Diagnosis not present

## 2020-10-01 DIAGNOSIS — E539 Vitamin B deficiency, unspecified: Secondary | ICD-10-CM | POA: Diagnosis not present

## 2020-10-01 DIAGNOSIS — I129 Hypertensive chronic kidney disease with stage 1 through stage 4 chronic kidney disease, or unspecified chronic kidney disease: Secondary | ICD-10-CM | POA: Diagnosis not present

## 2020-10-01 DIAGNOSIS — Z96698 Presence of other orthopedic joint implants: Secondary | ICD-10-CM | POA: Diagnosis not present

## 2020-10-01 DIAGNOSIS — Z6832 Body mass index (BMI) 32.0-32.9, adult: Secondary | ICD-10-CM | POA: Diagnosis not present

## 2020-10-01 DIAGNOSIS — N183 Chronic kidney disease, stage 3 unspecified: Secondary | ICD-10-CM | POA: Diagnosis not present

## 2020-10-02 DIAGNOSIS — I129 Hypertensive chronic kidney disease with stage 1 through stage 4 chronic kidney disease, or unspecified chronic kidney disease: Secondary | ICD-10-CM | POA: Diagnosis not present

## 2020-10-02 DIAGNOSIS — E559 Vitamin D deficiency, unspecified: Secondary | ICD-10-CM | POA: Diagnosis not present

## 2020-10-02 DIAGNOSIS — E1022 Type 1 diabetes mellitus with diabetic chronic kidney disease: Secondary | ICD-10-CM | POA: Diagnosis not present

## 2020-10-02 DIAGNOSIS — E539 Vitamin B deficiency, unspecified: Secondary | ICD-10-CM | POA: Diagnosis not present

## 2020-10-02 DIAGNOSIS — W19XXXA Unspecified fall, initial encounter: Secondary | ICD-10-CM | POA: Diagnosis not present

## 2020-10-02 DIAGNOSIS — N183 Chronic kidney disease, stage 3 unspecified: Secondary | ICD-10-CM | POA: Diagnosis not present

## 2020-10-02 DIAGNOSIS — Z96698 Presence of other orthopedic joint implants: Secondary | ICD-10-CM | POA: Diagnosis not present

## 2020-10-02 DIAGNOSIS — Z6832 Body mass index (BMI) 32.0-32.9, adult: Secondary | ICD-10-CM | POA: Diagnosis not present

## 2020-10-02 DIAGNOSIS — E669 Obesity, unspecified: Secondary | ICD-10-CM | POA: Diagnosis not present

## 2020-10-03 DIAGNOSIS — S82401D Unspecified fracture of shaft of right fibula, subsequent encounter for closed fracture with routine healing: Secondary | ICD-10-CM | POA: Diagnosis not present

## 2020-10-03 DIAGNOSIS — R41841 Cognitive communication deficit: Secondary | ICD-10-CM | POA: Diagnosis not present

## 2020-10-03 DIAGNOSIS — I129 Hypertensive chronic kidney disease with stage 1 through stage 4 chronic kidney disease, or unspecified chronic kidney disease: Secondary | ICD-10-CM | POA: Diagnosis not present

## 2020-10-03 DIAGNOSIS — E669 Obesity, unspecified: Secondary | ICD-10-CM | POA: Diagnosis not present

## 2020-10-03 DIAGNOSIS — S82141D Displaced bicondylar fracture of right tibia, subsequent encounter for closed fracture with routine healing: Secondary | ICD-10-CM | POA: Diagnosis not present

## 2020-10-03 DIAGNOSIS — Z743 Need for continuous supervision: Secondary | ICD-10-CM | POA: Diagnosis not present

## 2020-10-03 DIAGNOSIS — M810 Age-related osteoporosis without current pathological fracture: Secondary | ICD-10-CM | POA: Diagnosis not present

## 2020-10-03 DIAGNOSIS — E1165 Type 2 diabetes mellitus with hyperglycemia: Secondary | ICD-10-CM | POA: Diagnosis not present

## 2020-10-03 DIAGNOSIS — N183 Chronic kidney disease, stage 3 unspecified: Secondary | ICD-10-CM | POA: Diagnosis not present

## 2020-10-03 DIAGNOSIS — Z20822 Contact with and (suspected) exposure to covid-19: Secondary | ICD-10-CM | POA: Diagnosis not present

## 2020-10-03 DIAGNOSIS — E1043 Type 1 diabetes mellitus with diabetic autonomic (poly)neuropathy: Secondary | ICD-10-CM | POA: Diagnosis not present

## 2020-10-03 DIAGNOSIS — Z6832 Body mass index (BMI) 32.0-32.9, adult: Secondary | ICD-10-CM | POA: Diagnosis not present

## 2020-10-03 DIAGNOSIS — E10649 Type 1 diabetes mellitus with hypoglycemia without coma: Secondary | ICD-10-CM | POA: Diagnosis not present

## 2020-10-03 DIAGNOSIS — R279 Unspecified lack of coordination: Secondary | ICD-10-CM | POA: Diagnosis not present

## 2020-10-03 DIAGNOSIS — I2510 Atherosclerotic heart disease of native coronary artery without angina pectoris: Secondary | ICD-10-CM | POA: Diagnosis not present

## 2020-10-03 DIAGNOSIS — S82109A Unspecified fracture of upper end of unspecified tibia, initial encounter for closed fracture: Secondary | ICD-10-CM | POA: Diagnosis not present

## 2020-10-03 DIAGNOSIS — R000 Tachycardia, unspecified: Secondary | ICD-10-CM | POA: Diagnosis not present

## 2020-10-03 DIAGNOSIS — Z96698 Presence of other orthopedic joint implants: Secondary | ICD-10-CM | POA: Diagnosis not present

## 2020-10-03 DIAGNOSIS — E539 Vitamin B deficiency, unspecified: Secondary | ICD-10-CM | POA: Diagnosis not present

## 2020-10-03 DIAGNOSIS — R2681 Unsteadiness on feet: Secondary | ICD-10-CM | POA: Diagnosis not present

## 2020-10-03 DIAGNOSIS — E1022 Type 1 diabetes mellitus with diabetic chronic kidney disease: Secondary | ICD-10-CM | POA: Diagnosis not present

## 2020-10-03 DIAGNOSIS — E559 Vitamin D deficiency, unspecified: Secondary | ICD-10-CM | POA: Diagnosis not present

## 2020-10-03 DIAGNOSIS — N179 Acute kidney failure, unspecified: Secondary | ICD-10-CM | POA: Diagnosis not present

## 2020-10-03 DIAGNOSIS — D259 Leiomyoma of uterus, unspecified: Secondary | ICD-10-CM | POA: Diagnosis not present

## 2020-10-03 DIAGNOSIS — R1111 Vomiting without nausea: Secondary | ICD-10-CM | POA: Diagnosis not present

## 2020-10-03 DIAGNOSIS — M6281 Muscle weakness (generalized): Secondary | ICD-10-CM | POA: Diagnosis not present

## 2020-10-03 DIAGNOSIS — R5381 Other malaise: Secondary | ICD-10-CM | POA: Diagnosis not present

## 2020-10-03 DIAGNOSIS — E785 Hyperlipidemia, unspecified: Secondary | ICD-10-CM | POA: Diagnosis not present

## 2020-10-03 DIAGNOSIS — Z9641 Presence of insulin pump (external) (internal): Secondary | ICD-10-CM | POA: Diagnosis not present

## 2020-10-03 DIAGNOSIS — E1010 Type 1 diabetes mellitus with ketoacidosis without coma: Secondary | ICD-10-CM | POA: Diagnosis not present

## 2020-10-03 DIAGNOSIS — R112 Nausea with vomiting, unspecified: Secondary | ICD-10-CM | POA: Diagnosis not present

## 2020-10-03 DIAGNOSIS — N200 Calculus of kidney: Secondary | ICD-10-CM | POA: Diagnosis not present

## 2020-10-03 DIAGNOSIS — W19XXXA Unspecified fall, initial encounter: Secondary | ICD-10-CM | POA: Diagnosis not present

## 2020-10-03 DIAGNOSIS — S82839A Other fracture of upper and lower end of unspecified fibula, initial encounter for closed fracture: Secondary | ICD-10-CM | POA: Diagnosis not present

## 2020-10-03 DIAGNOSIS — I959 Hypotension, unspecified: Secondary | ICD-10-CM | POA: Diagnosis not present

## 2020-10-03 DIAGNOSIS — K449 Diaphragmatic hernia without obstruction or gangrene: Secondary | ICD-10-CM | POA: Diagnosis not present

## 2020-10-03 DIAGNOSIS — R1084 Generalized abdominal pain: Secondary | ICD-10-CM | POA: Diagnosis not present

## 2020-10-03 DIAGNOSIS — E8889 Other specified metabolic disorders: Secondary | ICD-10-CM | POA: Diagnosis not present

## 2020-10-03 DIAGNOSIS — K3184 Gastroparesis: Secondary | ICD-10-CM | POA: Diagnosis not present

## 2020-10-03 DIAGNOSIS — Z9181 History of falling: Secondary | ICD-10-CM | POA: Diagnosis not present

## 2020-10-05 ENCOUNTER — Other Ambulatory Visit: Payer: Self-pay

## 2020-10-05 DIAGNOSIS — M810 Age-related osteoporosis without current pathological fracture: Secondary | ICD-10-CM | POA: Diagnosis not present

## 2020-10-05 DIAGNOSIS — S82109A Unspecified fracture of upper end of unspecified tibia, initial encounter for closed fracture: Secondary | ICD-10-CM | POA: Diagnosis not present

## 2020-10-05 DIAGNOSIS — R5381 Other malaise: Secondary | ICD-10-CM | POA: Diagnosis not present

## 2020-10-05 DIAGNOSIS — S82839A Other fracture of upper and lower end of unspecified fibula, initial encounter for closed fracture: Secondary | ICD-10-CM | POA: Diagnosis not present

## 2020-10-05 NOTE — Patient Outreach (Signed)
[facility]) Care Management  10/05/2020  Tavaria Mackins [birth year] [id number]     Transition of Care Referral  Referral Date: 10/05/2020  Referral Source: [facility] Discharge Report Date of Discharge: 10/03/2020 Facility: [facility] Insurance: [facility] Medicare    Referral received. Upon EMR review patient discharged to [facility] for rehab.    Plan: RN CM will close referral at this time.    Enzo [place], RN,BSN,CCM [place] Management Telephonic Care Management Coordinator Direct Phone: [phone] Toll Free: [phone] Fax: [phone]

## 2020-10-06 ENCOUNTER — Encounter: Payer: Self-pay | Admitting: Cardiovascular Disease

## 2020-10-06 NOTE — Progress Notes (Signed)
No show  This encounter was created in error - please disregard.

## 2020-10-07 ENCOUNTER — Encounter: Payer: Medicare HMO | Admitting: Cardiovascular Disease

## 2020-10-10 DIAGNOSIS — L89616 Pressure-induced deep tissue damage of right heel: Secondary | ICD-10-CM | POA: Diagnosis not present

## 2020-10-10 DIAGNOSIS — E1065 Type 1 diabetes mellitus with hyperglycemia: Secondary | ICD-10-CM | POA: Diagnosis not present

## 2020-10-10 DIAGNOSIS — N183 Chronic kidney disease, stage 3 unspecified: Secondary | ICD-10-CM | POA: Diagnosis not present

## 2020-10-10 DIAGNOSIS — R1111 Vomiting without nausea: Secondary | ICD-10-CM | POA: Diagnosis not present

## 2020-10-10 DIAGNOSIS — Z951 Presence of aortocoronary bypass graft: Secondary | ICD-10-CM | POA: Diagnosis not present

## 2020-10-10 DIAGNOSIS — R000 Tachycardia, unspecified: Secondary | ICD-10-CM | POA: Diagnosis not present

## 2020-10-10 DIAGNOSIS — K449 Diaphragmatic hernia without obstruction or gangrene: Secondary | ICD-10-CM | POA: Diagnosis not present

## 2020-10-10 DIAGNOSIS — E785 Hyperlipidemia, unspecified: Secondary | ICD-10-CM | POA: Diagnosis not present

## 2020-10-10 DIAGNOSIS — I672 Cerebral atherosclerosis: Secondary | ICD-10-CM | POA: Diagnosis not present

## 2020-10-10 DIAGNOSIS — E8889 Other specified metabolic disorders: Secondary | ICD-10-CM | POA: Diagnosis not present

## 2020-10-10 DIAGNOSIS — R400 Somnolence: Secondary | ICD-10-CM | POA: Diagnosis not present

## 2020-10-10 DIAGNOSIS — N179 Acute kidney failure, unspecified: Secondary | ICD-10-CM | POA: Diagnosis not present

## 2020-10-10 DIAGNOSIS — R112 Nausea with vomiting, unspecified: Secondary | ICD-10-CM | POA: Diagnosis not present

## 2020-10-10 DIAGNOSIS — I959 Hypotension, unspecified: Secondary | ICD-10-CM | POA: Diagnosis not present

## 2020-10-10 DIAGNOSIS — S82201D Unspecified fracture of shaft of right tibia, subsequent encounter for closed fracture with routine healing: Secondary | ICD-10-CM | POA: Diagnosis not present

## 2020-10-10 DIAGNOSIS — E1043 Type 1 diabetes mellitus with diabetic autonomic (poly)neuropathy: Secondary | ICD-10-CM | POA: Diagnosis not present

## 2020-10-10 DIAGNOSIS — Z20822 Contact with and (suspected) exposure to covid-19: Secondary | ICD-10-CM | POA: Diagnosis not present

## 2020-10-10 DIAGNOSIS — E10649 Type 1 diabetes mellitus with hypoglycemia without coma: Secondary | ICD-10-CM | POA: Diagnosis not present

## 2020-10-10 DIAGNOSIS — E1010 Type 1 diabetes mellitus with ketoacidosis without coma: Secondary | ICD-10-CM | POA: Diagnosis not present

## 2020-10-10 DIAGNOSIS — R1084 Generalized abdominal pain: Secondary | ICD-10-CM | POA: Diagnosis not present

## 2020-10-10 DIAGNOSIS — I129 Hypertensive chronic kidney disease with stage 1 through stage 4 chronic kidney disease, or unspecified chronic kidney disease: Secondary | ICD-10-CM | POA: Diagnosis not present

## 2020-10-10 DIAGNOSIS — N200 Calculus of kidney: Secondary | ICD-10-CM | POA: Diagnosis not present

## 2020-10-10 DIAGNOSIS — E1022 Type 1 diabetes mellitus with diabetic chronic kidney disease: Secondary | ICD-10-CM | POA: Diagnosis not present

## 2020-10-10 DIAGNOSIS — R6889 Other general symptoms and signs: Secondary | ICD-10-CM | POA: Diagnosis not present

## 2020-10-10 DIAGNOSIS — E870 Hyperosmolality and hypernatremia: Secondary | ICD-10-CM | POA: Diagnosis not present

## 2020-10-10 DIAGNOSIS — D259 Leiomyoma of uterus, unspecified: Secondary | ICD-10-CM | POA: Diagnosis not present

## 2020-10-10 DIAGNOSIS — R279 Unspecified lack of coordination: Secondary | ICD-10-CM | POA: Diagnosis not present

## 2020-10-10 DIAGNOSIS — G4710 Hypersomnia, unspecified: Secondary | ICD-10-CM | POA: Diagnosis not present

## 2020-10-10 DIAGNOSIS — I700 Atherosclerosis of aorta: Secondary | ICD-10-CM | POA: Diagnosis not present

## 2020-10-10 DIAGNOSIS — D72829 Elevated white blood cell count, unspecified: Secondary | ICD-10-CM | POA: Diagnosis not present

## 2020-10-10 DIAGNOSIS — E10621 Type 1 diabetes mellitus with foot ulcer: Secondary | ICD-10-CM | POA: Diagnosis not present

## 2020-10-10 DIAGNOSIS — S82101A Unspecified fracture of upper end of right tibia, initial encounter for closed fracture: Secondary | ICD-10-CM | POA: Diagnosis not present

## 2020-10-10 DIAGNOSIS — Z743 Need for continuous supervision: Secondary | ICD-10-CM | POA: Diagnosis not present

## 2020-10-10 DIAGNOSIS — E1165 Type 2 diabetes mellitus with hyperglycemia: Secondary | ICD-10-CM | POA: Diagnosis not present

## 2020-10-10 DIAGNOSIS — L89626 Pressure-induced deep tissue damage of left heel: Secondary | ICD-10-CM | POA: Diagnosis not present

## 2020-10-10 DIAGNOSIS — Z9641 Presence of insulin pump (external) (internal): Secondary | ICD-10-CM | POA: Diagnosis not present

## 2020-10-10 DIAGNOSIS — E669 Obesity, unspecified: Secondary | ICD-10-CM | POA: Diagnosis not present

## 2020-10-10 DIAGNOSIS — Z96698 Presence of other orthopedic joint implants: Secondary | ICD-10-CM | POA: Diagnosis not present

## 2020-10-10 DIAGNOSIS — I2510 Atherosclerotic heart disease of native coronary artery without angina pectoris: Secondary | ICD-10-CM | POA: Diagnosis not present

## 2020-10-10 DIAGNOSIS — Z789 Other specified health status: Secondary | ICD-10-CM | POA: Diagnosis not present

## 2020-10-10 DIAGNOSIS — Z4789 Encounter for other orthopedic aftercare: Secondary | ICD-10-CM | POA: Diagnosis not present

## 2020-10-11 DIAGNOSIS — Z96698 Presence of other orthopedic joint implants: Secondary | ICD-10-CM | POA: Diagnosis not present

## 2020-10-11 DIAGNOSIS — E870 Hyperosmolality and hypernatremia: Secondary | ICD-10-CM | POA: Diagnosis not present

## 2020-10-11 DIAGNOSIS — D72829 Elevated white blood cell count, unspecified: Secondary | ICD-10-CM | POA: Diagnosis not present

## 2020-10-11 DIAGNOSIS — N183 Chronic kidney disease, stage 3 unspecified: Secondary | ICD-10-CM | POA: Diagnosis not present

## 2020-10-11 DIAGNOSIS — E1022 Type 1 diabetes mellitus with diabetic chronic kidney disease: Secondary | ICD-10-CM | POA: Diagnosis not present

## 2020-10-11 DIAGNOSIS — I129 Hypertensive chronic kidney disease with stage 1 through stage 4 chronic kidney disease, or unspecified chronic kidney disease: Secondary | ICD-10-CM | POA: Diagnosis not present

## 2020-10-11 DIAGNOSIS — E1010 Type 1 diabetes mellitus with ketoacidosis without coma: Secondary | ICD-10-CM | POA: Diagnosis not present

## 2020-10-11 DIAGNOSIS — Z789 Other specified health status: Secondary | ICD-10-CM | POA: Diagnosis not present

## 2020-10-11 DIAGNOSIS — N179 Acute kidney failure, unspecified: Secondary | ICD-10-CM | POA: Diagnosis not present

## 2020-10-12 DIAGNOSIS — E1010 Type 1 diabetes mellitus with ketoacidosis without coma: Secondary | ICD-10-CM | POA: Diagnosis not present

## 2020-10-12 DIAGNOSIS — R400 Somnolence: Secondary | ICD-10-CM | POA: Diagnosis not present

## 2020-10-12 DIAGNOSIS — N183 Chronic kidney disease, stage 3 unspecified: Secondary | ICD-10-CM | POA: Diagnosis not present

## 2020-10-12 DIAGNOSIS — G4710 Hypersomnia, unspecified: Secondary | ICD-10-CM | POA: Diagnosis not present

## 2020-10-12 DIAGNOSIS — E870 Hyperosmolality and hypernatremia: Secondary | ICD-10-CM | POA: Diagnosis not present

## 2020-10-12 DIAGNOSIS — I672 Cerebral atherosclerosis: Secondary | ICD-10-CM | POA: Diagnosis not present

## 2020-10-12 DIAGNOSIS — D72829 Elevated white blood cell count, unspecified: Secondary | ICD-10-CM | POA: Diagnosis not present

## 2020-10-12 DIAGNOSIS — I129 Hypertensive chronic kidney disease with stage 1 through stage 4 chronic kidney disease, or unspecified chronic kidney disease: Secondary | ICD-10-CM | POA: Diagnosis not present

## 2020-10-12 DIAGNOSIS — Z96698 Presence of other orthopedic joint implants: Secondary | ICD-10-CM | POA: Diagnosis not present

## 2020-10-12 DIAGNOSIS — Z789 Other specified health status: Secondary | ICD-10-CM | POA: Diagnosis not present

## 2020-10-12 DIAGNOSIS — E1022 Type 1 diabetes mellitus with diabetic chronic kidney disease: Secondary | ICD-10-CM | POA: Diagnosis not present

## 2020-10-12 DIAGNOSIS — N179 Acute kidney failure, unspecified: Secondary | ICD-10-CM | POA: Diagnosis not present

## 2020-10-13 DIAGNOSIS — E1065 Type 1 diabetes mellitus with hyperglycemia: Secondary | ICD-10-CM | POA: Diagnosis not present

## 2020-10-13 DIAGNOSIS — N183 Chronic kidney disease, stage 3 unspecified: Secondary | ICD-10-CM | POA: Diagnosis not present

## 2020-10-13 DIAGNOSIS — E1010 Type 1 diabetes mellitus with ketoacidosis without coma: Secondary | ICD-10-CM | POA: Diagnosis not present

## 2020-10-13 DIAGNOSIS — E870 Hyperosmolality and hypernatremia: Secondary | ICD-10-CM | POA: Diagnosis not present

## 2020-10-13 DIAGNOSIS — E10621 Type 1 diabetes mellitus with foot ulcer: Secondary | ICD-10-CM | POA: Diagnosis not present

## 2020-10-13 DIAGNOSIS — R112 Nausea with vomiting, unspecified: Secondary | ICD-10-CM | POA: Diagnosis not present

## 2020-10-13 DIAGNOSIS — I129 Hypertensive chronic kidney disease with stage 1 through stage 4 chronic kidney disease, or unspecified chronic kidney disease: Secondary | ICD-10-CM | POA: Diagnosis not present

## 2020-10-13 DIAGNOSIS — E1022 Type 1 diabetes mellitus with diabetic chronic kidney disease: Secondary | ICD-10-CM | POA: Diagnosis not present

## 2020-10-13 DIAGNOSIS — N179 Acute kidney failure, unspecified: Secondary | ICD-10-CM | POA: Diagnosis not present

## 2020-10-14 DIAGNOSIS — N183 Chronic kidney disease, stage 3 unspecified: Secondary | ICD-10-CM | POA: Diagnosis not present

## 2020-10-14 DIAGNOSIS — E10621 Type 1 diabetes mellitus with foot ulcer: Secondary | ICD-10-CM | POA: Diagnosis not present

## 2020-10-14 DIAGNOSIS — E1022 Type 1 diabetes mellitus with diabetic chronic kidney disease: Secondary | ICD-10-CM | POA: Diagnosis not present

## 2020-10-14 DIAGNOSIS — E870 Hyperosmolality and hypernatremia: Secondary | ICD-10-CM | POA: Diagnosis not present

## 2020-10-14 DIAGNOSIS — E1065 Type 1 diabetes mellitus with hyperglycemia: Secondary | ICD-10-CM | POA: Diagnosis not present

## 2020-10-14 DIAGNOSIS — I129 Hypertensive chronic kidney disease with stage 1 through stage 4 chronic kidney disease, or unspecified chronic kidney disease: Secondary | ICD-10-CM | POA: Diagnosis not present

## 2020-10-14 DIAGNOSIS — E1010 Type 1 diabetes mellitus with ketoacidosis without coma: Secondary | ICD-10-CM | POA: Diagnosis not present

## 2020-10-14 DIAGNOSIS — R112 Nausea with vomiting, unspecified: Secondary | ICD-10-CM | POA: Diagnosis not present

## 2020-10-14 DIAGNOSIS — N179 Acute kidney failure, unspecified: Secondary | ICD-10-CM | POA: Diagnosis not present

## 2020-10-15 DIAGNOSIS — N179 Acute kidney failure, unspecified: Secondary | ICD-10-CM | POA: Diagnosis not present

## 2020-10-15 DIAGNOSIS — E1010 Type 1 diabetes mellitus with ketoacidosis without coma: Secondary | ICD-10-CM | POA: Diagnosis not present

## 2020-10-15 DIAGNOSIS — E1022 Type 1 diabetes mellitus with diabetic chronic kidney disease: Secondary | ICD-10-CM | POA: Diagnosis not present

## 2020-10-15 DIAGNOSIS — E870 Hyperosmolality and hypernatremia: Secondary | ICD-10-CM | POA: Diagnosis not present

## 2020-10-15 DIAGNOSIS — E10621 Type 1 diabetes mellitus with foot ulcer: Secondary | ICD-10-CM | POA: Diagnosis not present

## 2020-10-15 DIAGNOSIS — I129 Hypertensive chronic kidney disease with stage 1 through stage 4 chronic kidney disease, or unspecified chronic kidney disease: Secondary | ICD-10-CM | POA: Diagnosis not present

## 2020-10-15 DIAGNOSIS — R112 Nausea with vomiting, unspecified: Secondary | ICD-10-CM | POA: Diagnosis not present

## 2020-10-15 DIAGNOSIS — N183 Chronic kidney disease, stage 3 unspecified: Secondary | ICD-10-CM | POA: Diagnosis not present

## 2020-10-15 DIAGNOSIS — E1065 Type 1 diabetes mellitus with hyperglycemia: Secondary | ICD-10-CM | POA: Diagnosis not present

## 2020-10-16 DIAGNOSIS — E1010 Type 1 diabetes mellitus with ketoacidosis without coma: Secondary | ICD-10-CM | POA: Diagnosis not present

## 2020-10-16 DIAGNOSIS — N183 Chronic kidney disease, stage 3 unspecified: Secondary | ICD-10-CM | POA: Diagnosis not present

## 2020-10-16 DIAGNOSIS — E1022 Type 1 diabetes mellitus with diabetic chronic kidney disease: Secondary | ICD-10-CM | POA: Diagnosis not present

## 2020-10-16 DIAGNOSIS — E1065 Type 1 diabetes mellitus with hyperglycemia: Secondary | ICD-10-CM | POA: Diagnosis not present

## 2020-10-16 DIAGNOSIS — I129 Hypertensive chronic kidney disease with stage 1 through stage 4 chronic kidney disease, or unspecified chronic kidney disease: Secondary | ICD-10-CM | POA: Diagnosis not present

## 2020-10-16 DIAGNOSIS — R112 Nausea with vomiting, unspecified: Secondary | ICD-10-CM | POA: Diagnosis not present

## 2020-10-16 DIAGNOSIS — E870 Hyperosmolality and hypernatremia: Secondary | ICD-10-CM | POA: Diagnosis not present

## 2020-10-16 DIAGNOSIS — E10621 Type 1 diabetes mellitus with foot ulcer: Secondary | ICD-10-CM | POA: Diagnosis not present

## 2020-10-16 DIAGNOSIS — N179 Acute kidney failure, unspecified: Secondary | ICD-10-CM | POA: Diagnosis not present

## 2020-10-17 DIAGNOSIS — L89626 Pressure-induced deep tissue damage of left heel: Secondary | ICD-10-CM | POA: Diagnosis not present

## 2020-10-17 DIAGNOSIS — L89610 Pressure ulcer of right heel, unstageable: Secondary | ICD-10-CM | POA: Diagnosis not present

## 2020-10-17 DIAGNOSIS — Z6841 Body Mass Index (BMI) 40.0 and over, adult: Secondary | ICD-10-CM | POA: Diagnosis not present

## 2020-10-17 DIAGNOSIS — E871 Hypo-osmolality and hyponatremia: Secondary | ICD-10-CM | POA: Diagnosis not present

## 2020-10-17 DIAGNOSIS — R6889 Other general symptoms and signs: Secondary | ICD-10-CM | POA: Diagnosis not present

## 2020-10-17 DIAGNOSIS — E860 Dehydration: Secondary | ICD-10-CM | POA: Diagnosis not present

## 2020-10-17 DIAGNOSIS — Z6831 Body mass index (BMI) 31.0-31.9, adult: Secondary | ICD-10-CM | POA: Diagnosis not present

## 2020-10-17 DIAGNOSIS — S82201D Unspecified fracture of shaft of right tibia, subsequent encounter for closed fracture with routine healing: Secondary | ICD-10-CM | POA: Diagnosis not present

## 2020-10-17 DIAGNOSIS — E1065 Type 1 diabetes mellitus with hyperglycemia: Secondary | ICD-10-CM | POA: Diagnosis not present

## 2020-10-17 DIAGNOSIS — N179 Acute kidney failure, unspecified: Secondary | ICD-10-CM | POA: Diagnosis not present

## 2020-10-17 DIAGNOSIS — R112 Nausea with vomiting, unspecified: Secondary | ICD-10-CM | POA: Diagnosis not present

## 2020-10-17 DIAGNOSIS — L89616 Pressure-induced deep tissue damage of right heel: Secondary | ICD-10-CM | POA: Diagnosis not present

## 2020-10-17 DIAGNOSIS — R279 Unspecified lack of coordination: Secondary | ICD-10-CM | POA: Diagnosis not present

## 2020-10-17 DIAGNOSIS — M2550 Pain in unspecified joint: Secondary | ICD-10-CM | POA: Diagnosis not present

## 2020-10-17 DIAGNOSIS — E10621 Type 1 diabetes mellitus with foot ulcer: Secondary | ICD-10-CM | POA: Diagnosis not present

## 2020-10-17 DIAGNOSIS — Z4789 Encounter for other orthopedic aftercare: Secondary | ICD-10-CM | POA: Diagnosis not present

## 2020-10-17 DIAGNOSIS — Z794 Long term (current) use of insulin: Secondary | ICD-10-CM | POA: Diagnosis not present

## 2020-10-17 DIAGNOSIS — Z7401 Bed confinement status: Secondary | ICD-10-CM | POA: Diagnosis not present

## 2020-10-17 DIAGNOSIS — R0689 Other abnormalities of breathing: Secondary | ICD-10-CM | POA: Diagnosis not present

## 2020-10-17 DIAGNOSIS — R5381 Other malaise: Secondary | ICD-10-CM | POA: Diagnosis not present

## 2020-10-17 DIAGNOSIS — K3184 Gastroparesis: Secondary | ICD-10-CM | POA: Diagnosis not present

## 2020-10-17 DIAGNOSIS — I700 Atherosclerosis of aorta: Secondary | ICD-10-CM | POA: Diagnosis not present

## 2020-10-17 DIAGNOSIS — N183 Chronic kidney disease, stage 3 unspecified: Secondary | ICD-10-CM | POA: Diagnosis not present

## 2020-10-17 DIAGNOSIS — I10 Essential (primary) hypertension: Secondary | ICD-10-CM | POA: Diagnosis not present

## 2020-10-17 DIAGNOSIS — I2510 Atherosclerotic heart disease of native coronary artery without angina pectoris: Secondary | ICD-10-CM | POA: Diagnosis not present

## 2020-10-17 DIAGNOSIS — Z743 Need for continuous supervision: Secondary | ICD-10-CM | POA: Diagnosis not present

## 2020-10-17 DIAGNOSIS — I129 Hypertensive chronic kidney disease with stage 1 through stage 4 chronic kidney disease, or unspecified chronic kidney disease: Secondary | ICD-10-CM | POA: Diagnosis not present

## 2020-10-17 DIAGNOSIS — E870 Hyperosmolality and hypernatremia: Secondary | ICD-10-CM | POA: Diagnosis not present

## 2020-10-17 DIAGNOSIS — E669 Obesity, unspecified: Secondary | ICD-10-CM | POA: Diagnosis not present

## 2020-10-17 DIAGNOSIS — E1022 Type 1 diabetes mellitus with diabetic chronic kidney disease: Secondary | ICD-10-CM | POA: Diagnosis not present

## 2020-10-17 DIAGNOSIS — Z951 Presence of aortocoronary bypass graft: Secondary | ICD-10-CM | POA: Diagnosis not present

## 2020-10-17 DIAGNOSIS — E1043 Type 1 diabetes mellitus with diabetic autonomic (poly)neuropathy: Secondary | ICD-10-CM | POA: Diagnosis not present

## 2020-10-17 DIAGNOSIS — D509 Iron deficiency anemia, unspecified: Secondary | ICD-10-CM | POA: Diagnosis not present

## 2020-10-17 DIAGNOSIS — E1010 Type 1 diabetes mellitus with ketoacidosis without coma: Secondary | ICD-10-CM | POA: Diagnosis not present

## 2020-10-17 DIAGNOSIS — R109 Unspecified abdominal pain: Secondary | ICD-10-CM | POA: Diagnosis not present

## 2020-10-18 DIAGNOSIS — L89610 Pressure ulcer of right heel, unstageable: Secondary | ICD-10-CM | POA: Diagnosis not present

## 2020-10-24 DIAGNOSIS — E1010 Type 1 diabetes mellitus with ketoacidosis without coma: Secondary | ICD-10-CM | POA: Diagnosis not present

## 2020-10-24 DIAGNOSIS — R5381 Other malaise: Secondary | ICD-10-CM | POA: Diagnosis not present

## 2020-10-28 DIAGNOSIS — E1065 Type 1 diabetes mellitus with hyperglycemia: Secondary | ICD-10-CM | POA: Diagnosis not present

## 2020-10-28 DIAGNOSIS — E860 Dehydration: Secondary | ICD-10-CM | POA: Diagnosis not present

## 2020-10-28 DIAGNOSIS — I2510 Atherosclerotic heart disease of native coronary artery without angina pectoris: Secondary | ICD-10-CM | POA: Diagnosis not present

## 2020-10-28 DIAGNOSIS — S82201D Unspecified fracture of shaft of right tibia, subsequent encounter for closed fracture with routine healing: Secondary | ICD-10-CM | POA: Diagnosis not present

## 2020-10-28 DIAGNOSIS — E1022 Type 1 diabetes mellitus with diabetic chronic kidney disease: Secondary | ICD-10-CM | POA: Diagnosis not present

## 2020-10-28 DIAGNOSIS — Z9641 Presence of insulin pump (external) (internal): Secondary | ICD-10-CM | POA: Diagnosis not present

## 2020-10-28 DIAGNOSIS — R109 Unspecified abdominal pain: Secondary | ICD-10-CM | POA: Diagnosis not present

## 2020-10-28 DIAGNOSIS — E871 Hypo-osmolality and hyponatremia: Secondary | ICD-10-CM | POA: Diagnosis not present

## 2020-10-28 DIAGNOSIS — L89626 Pressure-induced deep tissue damage of left heel: Secondary | ICD-10-CM | POA: Diagnosis not present

## 2020-10-28 DIAGNOSIS — Z951 Presence of aortocoronary bypass graft: Secondary | ICD-10-CM | POA: Diagnosis not present

## 2020-10-28 DIAGNOSIS — E669 Obesity, unspecified: Secondary | ICD-10-CM | POA: Diagnosis not present

## 2020-10-28 DIAGNOSIS — I700 Atherosclerosis of aorta: Secondary | ICD-10-CM | POA: Diagnosis not present

## 2020-10-28 DIAGNOSIS — I10 Essential (primary) hypertension: Secondary | ICD-10-CM | POA: Diagnosis not present

## 2020-10-28 DIAGNOSIS — Z743 Need for continuous supervision: Secondary | ICD-10-CM | POA: Diagnosis not present

## 2020-10-28 DIAGNOSIS — Z6841 Body Mass Index (BMI) 40.0 and over, adult: Secondary | ICD-10-CM | POA: Diagnosis not present

## 2020-10-28 DIAGNOSIS — R6889 Other general symptoms and signs: Secondary | ICD-10-CM | POA: Diagnosis not present

## 2020-10-28 DIAGNOSIS — R112 Nausea with vomiting, unspecified: Secondary | ICD-10-CM | POA: Diagnosis not present

## 2020-10-28 DIAGNOSIS — K3184 Gastroparesis: Secondary | ICD-10-CM | POA: Diagnosis not present

## 2020-10-28 DIAGNOSIS — R279 Unspecified lack of coordination: Secondary | ICD-10-CM | POA: Diagnosis not present

## 2020-10-28 DIAGNOSIS — E1043 Type 1 diabetes mellitus with diabetic autonomic (poly)neuropathy: Secondary | ICD-10-CM | POA: Diagnosis not present

## 2020-10-28 DIAGNOSIS — Z4789 Encounter for other orthopedic aftercare: Secondary | ICD-10-CM | POA: Diagnosis not present

## 2020-10-28 DIAGNOSIS — Z794 Long term (current) use of insulin: Secondary | ICD-10-CM | POA: Diagnosis not present

## 2020-10-28 DIAGNOSIS — I129 Hypertensive chronic kidney disease with stage 1 through stage 4 chronic kidney disease, or unspecified chronic kidney disease: Secondary | ICD-10-CM | POA: Diagnosis not present

## 2020-10-28 DIAGNOSIS — D509 Iron deficiency anemia, unspecified: Secondary | ICD-10-CM | POA: Diagnosis not present

## 2020-10-28 DIAGNOSIS — Z8781 Personal history of (healed) traumatic fracture: Secondary | ICD-10-CM | POA: Diagnosis not present

## 2020-10-28 DIAGNOSIS — N183 Chronic kidney disease, stage 3 unspecified: Secondary | ICD-10-CM | POA: Diagnosis not present

## 2020-10-28 DIAGNOSIS — Z8679 Personal history of other diseases of the circulatory system: Secondary | ICD-10-CM | POA: Diagnosis not present

## 2020-10-28 DIAGNOSIS — R0689 Other abnormalities of breathing: Secondary | ICD-10-CM | POA: Diagnosis not present

## 2020-10-28 DIAGNOSIS — S9032XA Contusion of left foot, initial encounter: Secondary | ICD-10-CM | POA: Diagnosis not present

## 2020-10-28 DIAGNOSIS — Z6831 Body mass index (BMI) 31.0-31.9, adult: Secondary | ICD-10-CM | POA: Diagnosis not present

## 2020-10-28 DIAGNOSIS — L89616 Pressure-induced deep tissue damage of right heel: Secondary | ICD-10-CM | POA: Diagnosis not present

## 2020-10-29 DIAGNOSIS — E1043 Type 1 diabetes mellitus with diabetic autonomic (poly)neuropathy: Secondary | ICD-10-CM | POA: Diagnosis not present

## 2020-10-29 DIAGNOSIS — E871 Hypo-osmolality and hyponatremia: Secondary | ICD-10-CM | POA: Diagnosis not present

## 2020-10-29 DIAGNOSIS — K3184 Gastroparesis: Secondary | ICD-10-CM | POA: Diagnosis not present

## 2020-10-29 DIAGNOSIS — Z8781 Personal history of (healed) traumatic fracture: Secondary | ICD-10-CM | POA: Diagnosis not present

## 2020-10-29 DIAGNOSIS — D509 Iron deficiency anemia, unspecified: Secondary | ICD-10-CM | POA: Diagnosis not present

## 2020-10-29 DIAGNOSIS — E1065 Type 1 diabetes mellitus with hyperglycemia: Secondary | ICD-10-CM | POA: Diagnosis not present

## 2020-10-29 DIAGNOSIS — R112 Nausea with vomiting, unspecified: Secondary | ICD-10-CM | POA: Diagnosis not present

## 2020-10-29 DIAGNOSIS — Z9641 Presence of insulin pump (external) (internal): Secondary | ICD-10-CM | POA: Diagnosis not present

## 2020-10-30 DIAGNOSIS — Z9641 Presence of insulin pump (external) (internal): Secondary | ICD-10-CM | POA: Diagnosis not present

## 2020-10-30 DIAGNOSIS — E871 Hypo-osmolality and hyponatremia: Secondary | ICD-10-CM | POA: Diagnosis not present

## 2020-10-30 DIAGNOSIS — R112 Nausea with vomiting, unspecified: Secondary | ICD-10-CM | POA: Diagnosis not present

## 2020-10-30 DIAGNOSIS — D509 Iron deficiency anemia, unspecified: Secondary | ICD-10-CM | POA: Diagnosis not present

## 2020-10-30 DIAGNOSIS — E1043 Type 1 diabetes mellitus with diabetic autonomic (poly)neuropathy: Secondary | ICD-10-CM | POA: Diagnosis not present

## 2020-10-30 DIAGNOSIS — Z8781 Personal history of (healed) traumatic fracture: Secondary | ICD-10-CM | POA: Diagnosis not present

## 2020-10-30 DIAGNOSIS — E1065 Type 1 diabetes mellitus with hyperglycemia: Secondary | ICD-10-CM | POA: Diagnosis not present

## 2020-10-30 DIAGNOSIS — K3184 Gastroparesis: Secondary | ICD-10-CM | POA: Diagnosis not present

## 2020-10-31 DIAGNOSIS — L89626 Pressure-induced deep tissue damage of left heel: Secondary | ICD-10-CM | POA: Diagnosis not present

## 2020-10-31 DIAGNOSIS — S9032XA Contusion of left foot, initial encounter: Secondary | ICD-10-CM | POA: Diagnosis not present

## 2020-10-31 DIAGNOSIS — F32A Depression, unspecified: Secondary | ICD-10-CM | POA: Diagnosis not present

## 2020-10-31 DIAGNOSIS — E860 Dehydration: Secondary | ICD-10-CM | POA: Diagnosis not present

## 2020-10-31 DIAGNOSIS — N183 Chronic kidney disease, stage 3 unspecified: Secondary | ICD-10-CM | POA: Diagnosis not present

## 2020-10-31 DIAGNOSIS — E1043 Type 1 diabetes mellitus with diabetic autonomic (poly)neuropathy: Secondary | ICD-10-CM | POA: Diagnosis not present

## 2020-10-31 DIAGNOSIS — E871 Hypo-osmolality and hyponatremia: Secondary | ICD-10-CM | POA: Diagnosis not present

## 2020-10-31 DIAGNOSIS — D509 Iron deficiency anemia, unspecified: Secondary | ICD-10-CM | POA: Diagnosis not present

## 2020-10-31 DIAGNOSIS — I2510 Atherosclerotic heart disease of native coronary artery without angina pectoris: Secondary | ICD-10-CM | POA: Diagnosis not present

## 2020-10-31 DIAGNOSIS — Z4789 Encounter for other orthopedic aftercare: Secondary | ICD-10-CM | POA: Diagnosis not present

## 2020-10-31 DIAGNOSIS — E1010 Type 1 diabetes mellitus with ketoacidosis without coma: Secondary | ICD-10-CM | POA: Diagnosis not present

## 2020-10-31 DIAGNOSIS — Z794 Long term (current) use of insulin: Secondary | ICD-10-CM | POA: Diagnosis not present

## 2020-10-31 DIAGNOSIS — R112 Nausea with vomiting, unspecified: Secondary | ICD-10-CM | POA: Diagnosis not present

## 2020-10-31 DIAGNOSIS — R1110 Vomiting, unspecified: Secondary | ICD-10-CM | POA: Diagnosis not present

## 2020-10-31 DIAGNOSIS — K3184 Gastroparesis: Secondary | ICD-10-CM | POA: Diagnosis not present

## 2020-10-31 DIAGNOSIS — F411 Generalized anxiety disorder: Secondary | ICD-10-CM | POA: Diagnosis not present

## 2020-10-31 DIAGNOSIS — E109 Type 1 diabetes mellitus without complications: Secondary | ICD-10-CM | POA: Diagnosis not present

## 2020-10-31 DIAGNOSIS — R279 Unspecified lack of coordination: Secondary | ICD-10-CM | POA: Diagnosis not present

## 2020-10-31 DIAGNOSIS — Z8781 Personal history of (healed) traumatic fracture: Secondary | ICD-10-CM | POA: Diagnosis not present

## 2020-10-31 DIAGNOSIS — Z951 Presence of aortocoronary bypass graft: Secondary | ICD-10-CM | POA: Diagnosis not present

## 2020-10-31 DIAGNOSIS — Z8719 Personal history of other diseases of the digestive system: Secondary | ICD-10-CM | POA: Diagnosis not present

## 2020-10-31 DIAGNOSIS — R6889 Other general symptoms and signs: Secondary | ICD-10-CM | POA: Diagnosis not present

## 2020-10-31 DIAGNOSIS — E872 Acidosis: Secondary | ICD-10-CM | POA: Diagnosis not present

## 2020-10-31 DIAGNOSIS — S82201D Unspecified fracture of shaft of right tibia, subsequent encounter for closed fracture with routine healing: Secondary | ICD-10-CM | POA: Diagnosis not present

## 2020-10-31 DIAGNOSIS — R739 Hyperglycemia, unspecified: Secondary | ICD-10-CM | POA: Diagnosis not present

## 2020-10-31 DIAGNOSIS — E1065 Type 1 diabetes mellitus with hyperglycemia: Secondary | ICD-10-CM | POA: Diagnosis not present

## 2020-10-31 DIAGNOSIS — R110 Nausea: Secondary | ICD-10-CM | POA: Diagnosis not present

## 2020-10-31 DIAGNOSIS — I129 Hypertensive chronic kidney disease with stage 1 through stage 4 chronic kidney disease, or unspecified chronic kidney disease: Secondary | ICD-10-CM | POA: Diagnosis not present

## 2020-10-31 DIAGNOSIS — Z6841 Body Mass Index (BMI) 40.0 and over, adult: Secondary | ICD-10-CM | POA: Diagnosis not present

## 2020-10-31 DIAGNOSIS — E1022 Type 1 diabetes mellitus with diabetic chronic kidney disease: Secondary | ICD-10-CM | POA: Diagnosis not present

## 2020-10-31 DIAGNOSIS — R0689 Other abnormalities of breathing: Secondary | ICD-10-CM | POA: Diagnosis not present

## 2020-10-31 DIAGNOSIS — Z743 Need for continuous supervision: Secondary | ICD-10-CM | POA: Diagnosis not present

## 2020-10-31 DIAGNOSIS — I700 Atherosclerosis of aorta: Secondary | ICD-10-CM | POA: Diagnosis not present

## 2020-10-31 DIAGNOSIS — E875 Hyperkalemia: Secondary | ICD-10-CM | POA: Diagnosis not present

## 2020-10-31 DIAGNOSIS — L89616 Pressure-induced deep tissue damage of right heel: Secondary | ICD-10-CM | POA: Diagnosis not present

## 2020-10-31 DIAGNOSIS — L89610 Pressure ulcer of right heel, unstageable: Secondary | ICD-10-CM | POA: Diagnosis not present

## 2020-10-31 DIAGNOSIS — R109 Unspecified abdominal pain: Secondary | ICD-10-CM | POA: Diagnosis not present

## 2020-10-31 DIAGNOSIS — N179 Acute kidney failure, unspecified: Secondary | ICD-10-CM | POA: Diagnosis not present

## 2020-10-31 DIAGNOSIS — E1165 Type 2 diabetes mellitus with hyperglycemia: Secondary | ICD-10-CM | POA: Diagnosis not present

## 2020-10-31 DIAGNOSIS — Z8679 Personal history of other diseases of the circulatory system: Secondary | ICD-10-CM | POA: Diagnosis not present

## 2020-10-31 DIAGNOSIS — Z9641 Presence of insulin pump (external) (internal): Secondary | ICD-10-CM | POA: Diagnosis not present

## 2020-11-01 ENCOUNTER — Ambulatory Visit: Payer: Medicare HMO | Admitting: Endocrinology

## 2020-11-01 DIAGNOSIS — F32A Depression, unspecified: Secondary | ICD-10-CM | POA: Diagnosis not present

## 2020-11-01 DIAGNOSIS — L89610 Pressure ulcer of right heel, unstageable: Secondary | ICD-10-CM | POA: Diagnosis not present

## 2020-11-01 DIAGNOSIS — F411 Generalized anxiety disorder: Secondary | ICD-10-CM | POA: Diagnosis not present

## 2020-11-02 ENCOUNTER — Other Ambulatory Visit: Payer: Self-pay

## 2020-11-02 NOTE — Patient Outreach (Signed)
[facility]) Care Management  11/02/2020  Ciella Obi [birth year] [id number]   Telephone Screen  Referral Date: 11/02/2020 Referral Source: [facility] Tier 4 Report Referral Reason: "high risk patient"   Referral received. Upon chart review noted that patient currently in SNF.    Plan: RN CM will close referral at this time.    Enzo [place], RN,BSN,CCM [place] Management Telephonic Care Management Coordinator Direct Phone: [phone] Toll Free: [phone] Fax: [phone]

## 2020-11-03 DIAGNOSIS — K219 Gastro-esophageal reflux disease without esophagitis: Secondary | ICD-10-CM | POA: Diagnosis not present

## 2020-11-03 DIAGNOSIS — Z8679 Personal history of other diseases of the circulatory system: Secondary | ICD-10-CM | POA: Diagnosis not present

## 2020-11-03 DIAGNOSIS — I2510 Atherosclerotic heart disease of native coronary artery without angina pectoris: Secondary | ICD-10-CM | POA: Diagnosis not present

## 2020-11-03 DIAGNOSIS — R739 Hyperglycemia, unspecified: Secondary | ICD-10-CM | POA: Diagnosis not present

## 2020-11-03 DIAGNOSIS — R109 Unspecified abdominal pain: Secondary | ICD-10-CM | POA: Diagnosis not present

## 2020-11-03 DIAGNOSIS — Z9981 Dependence on supplemental oxygen: Secondary | ICD-10-CM | POA: Diagnosis not present

## 2020-11-03 DIAGNOSIS — Z6841 Body Mass Index (BMI) 40.0 and over, adult: Secondary | ICD-10-CM | POA: Diagnosis not present

## 2020-11-03 DIAGNOSIS — R9431 Abnormal electrocardiogram [ECG] [EKG]: Secondary | ICD-10-CM | POA: Diagnosis not present

## 2020-11-03 DIAGNOSIS — E1043 Type 1 diabetes mellitus with diabetic autonomic (poly)neuropathy: Secondary | ICD-10-CM | POA: Diagnosis not present

## 2020-11-03 DIAGNOSIS — Z8719 Personal history of other diseases of the digestive system: Secondary | ICD-10-CM | POA: Diagnosis not present

## 2020-11-03 DIAGNOSIS — Z4789 Encounter for other orthopedic aftercare: Secondary | ICD-10-CM | POA: Diagnosis not present

## 2020-11-03 DIAGNOSIS — I10 Essential (primary) hypertension: Secondary | ICD-10-CM | POA: Diagnosis not present

## 2020-11-03 DIAGNOSIS — R110 Nausea: Secondary | ICD-10-CM | POA: Diagnosis not present

## 2020-11-03 DIAGNOSIS — E538 Deficiency of other specified B group vitamins: Secondary | ICD-10-CM | POA: Diagnosis not present

## 2020-11-03 DIAGNOSIS — Z743 Need for continuous supervision: Secondary | ICD-10-CM | POA: Diagnosis not present

## 2020-11-03 DIAGNOSIS — R0689 Other abnormalities of breathing: Secondary | ICD-10-CM | POA: Diagnosis not present

## 2020-11-03 DIAGNOSIS — I700 Atherosclerosis of aorta: Secondary | ICD-10-CM | POA: Diagnosis not present

## 2020-11-03 DIAGNOSIS — N183 Chronic kidney disease, stage 3 unspecified: Secondary | ICD-10-CM | POA: Diagnosis not present

## 2020-11-03 DIAGNOSIS — S82201D Unspecified fracture of shaft of right tibia, subsequent encounter for closed fracture with routine healing: Secondary | ICD-10-CM | POA: Diagnosis not present

## 2020-11-03 DIAGNOSIS — G629 Polyneuropathy, unspecified: Secondary | ICD-10-CM | POA: Diagnosis not present

## 2020-11-03 DIAGNOSIS — Z8781 Personal history of (healed) traumatic fracture: Secondary | ICD-10-CM | POA: Diagnosis not present

## 2020-11-03 DIAGNOSIS — K3184 Gastroparesis: Secondary | ICD-10-CM | POA: Diagnosis not present

## 2020-11-03 DIAGNOSIS — W19XXXA Unspecified fall, initial encounter: Secondary | ICD-10-CM | POA: Diagnosis not present

## 2020-11-03 DIAGNOSIS — E1065 Type 1 diabetes mellitus with hyperglycemia: Secondary | ICD-10-CM | POA: Diagnosis not present

## 2020-11-03 DIAGNOSIS — R1110 Vomiting, unspecified: Secondary | ICD-10-CM | POA: Diagnosis not present

## 2020-11-03 DIAGNOSIS — E1010 Type 1 diabetes mellitus with ketoacidosis without coma: Secondary | ICD-10-CM | POA: Diagnosis not present

## 2020-11-03 DIAGNOSIS — E860 Dehydration: Secondary | ICD-10-CM | POA: Diagnosis not present

## 2020-11-03 DIAGNOSIS — L89620 Pressure ulcer of left heel, unstageable: Secondary | ICD-10-CM | POA: Diagnosis not present

## 2020-11-03 DIAGNOSIS — E1040 Type 1 diabetes mellitus with diabetic neuropathy, unspecified: Secondary | ICD-10-CM | POA: Diagnosis not present

## 2020-11-03 DIAGNOSIS — E875 Hyperkalemia: Secondary | ICD-10-CM | POA: Diagnosis not present

## 2020-11-03 DIAGNOSIS — R6889 Other general symptoms and signs: Secondary | ICD-10-CM | POA: Diagnosis not present

## 2020-11-03 DIAGNOSIS — R112 Nausea with vomiting, unspecified: Secondary | ICD-10-CM | POA: Diagnosis not present

## 2020-11-03 DIAGNOSIS — L89626 Pressure-induced deep tissue damage of left heel: Secondary | ICD-10-CM | POA: Diagnosis not present

## 2020-11-03 DIAGNOSIS — I959 Hypotension, unspecified: Secondary | ICD-10-CM | POA: Diagnosis not present

## 2020-11-03 DIAGNOSIS — Z794 Long term (current) use of insulin: Secondary | ICD-10-CM | POA: Diagnosis not present

## 2020-11-03 DIAGNOSIS — E109 Type 1 diabetes mellitus without complications: Secondary | ICD-10-CM | POA: Diagnosis not present

## 2020-11-03 DIAGNOSIS — E872 Acidosis: Secondary | ICD-10-CM | POA: Diagnosis not present

## 2020-11-03 DIAGNOSIS — N179 Acute kidney failure, unspecified: Secondary | ICD-10-CM | POA: Diagnosis not present

## 2020-11-03 DIAGNOSIS — S82401D Unspecified fracture of shaft of right fibula, subsequent encounter for closed fracture with routine healing: Secondary | ICD-10-CM | POA: Diagnosis not present

## 2020-11-03 DIAGNOSIS — E1165 Type 2 diabetes mellitus with hyperglycemia: Secondary | ICD-10-CM | POA: Diagnosis not present

## 2020-11-03 DIAGNOSIS — F418 Other specified anxiety disorders: Secondary | ICD-10-CM | POA: Diagnosis not present

## 2020-11-03 DIAGNOSIS — E1022 Type 1 diabetes mellitus with diabetic chronic kidney disease: Secondary | ICD-10-CM | POA: Diagnosis not present

## 2020-11-03 DIAGNOSIS — D509 Iron deficiency anemia, unspecified: Secondary | ICD-10-CM | POA: Diagnosis not present

## 2020-11-03 DIAGNOSIS — Z9641 Presence of insulin pump (external) (internal): Secondary | ICD-10-CM | POA: Diagnosis not present

## 2020-11-03 DIAGNOSIS — Z951 Presence of aortocoronary bypass graft: Secondary | ICD-10-CM | POA: Diagnosis not present

## 2020-11-04 DIAGNOSIS — Z8781 Personal history of (healed) traumatic fracture: Secondary | ICD-10-CM | POA: Diagnosis not present

## 2020-11-04 DIAGNOSIS — R9431 Abnormal electrocardiogram [ECG] [EKG]: Secondary | ICD-10-CM | POA: Diagnosis not present

## 2020-11-04 DIAGNOSIS — E1043 Type 1 diabetes mellitus with diabetic autonomic (poly)neuropathy: Secondary | ICD-10-CM | POA: Diagnosis not present

## 2020-11-04 DIAGNOSIS — L89620 Pressure ulcer of left heel, unstageable: Secondary | ICD-10-CM | POA: Diagnosis not present

## 2020-11-04 DIAGNOSIS — D509 Iron deficiency anemia, unspecified: Secondary | ICD-10-CM | POA: Diagnosis not present

## 2020-11-04 DIAGNOSIS — Z8719 Personal history of other diseases of the digestive system: Secondary | ICD-10-CM | POA: Diagnosis not present

## 2020-11-04 DIAGNOSIS — Z9981 Dependence on supplemental oxygen: Secondary | ICD-10-CM | POA: Diagnosis not present

## 2020-11-04 DIAGNOSIS — I10 Essential (primary) hypertension: Secondary | ICD-10-CM | POA: Diagnosis not present

## 2020-11-04 DIAGNOSIS — I2510 Atherosclerotic heart disease of native coronary artery without angina pectoris: Secondary | ICD-10-CM | POA: Diagnosis not present

## 2020-11-04 DIAGNOSIS — E1010 Type 1 diabetes mellitus with ketoacidosis without coma: Secondary | ICD-10-CM | POA: Diagnosis not present

## 2020-11-04 DIAGNOSIS — Z794 Long term (current) use of insulin: Secondary | ICD-10-CM | POA: Diagnosis not present

## 2020-11-04 DIAGNOSIS — E1040 Type 1 diabetes mellitus with diabetic neuropathy, unspecified: Secondary | ICD-10-CM | POA: Diagnosis not present

## 2020-11-04 DIAGNOSIS — K3184 Gastroparesis: Secondary | ICD-10-CM | POA: Diagnosis not present

## 2020-11-04 DIAGNOSIS — Z8679 Personal history of other diseases of the circulatory system: Secondary | ICD-10-CM | POA: Diagnosis not present

## 2020-11-04 DIAGNOSIS — Z951 Presence of aortocoronary bypass graft: Secondary | ICD-10-CM | POA: Diagnosis not present

## 2020-11-05 DIAGNOSIS — Z951 Presence of aortocoronary bypass graft: Secondary | ICD-10-CM | POA: Diagnosis not present

## 2020-11-05 DIAGNOSIS — F418 Other specified anxiety disorders: Secondary | ICD-10-CM | POA: Diagnosis not present

## 2020-11-05 DIAGNOSIS — G629 Polyneuropathy, unspecified: Secondary | ICD-10-CM | POA: Diagnosis not present

## 2020-11-05 DIAGNOSIS — E1010 Type 1 diabetes mellitus with ketoacidosis without coma: Secondary | ICD-10-CM | POA: Diagnosis not present

## 2020-11-05 DIAGNOSIS — I10 Essential (primary) hypertension: Secondary | ICD-10-CM | POA: Diagnosis not present

## 2020-11-05 DIAGNOSIS — D509 Iron deficiency anemia, unspecified: Secondary | ICD-10-CM | POA: Diagnosis not present

## 2020-11-05 DIAGNOSIS — L89620 Pressure ulcer of left heel, unstageable: Secondary | ICD-10-CM | POA: Diagnosis not present

## 2020-11-05 DIAGNOSIS — E538 Deficiency of other specified B group vitamins: Secondary | ICD-10-CM | POA: Diagnosis not present

## 2020-11-05 DIAGNOSIS — K219 Gastro-esophageal reflux disease without esophagitis: Secondary | ICD-10-CM | POA: Diagnosis not present

## 2020-11-06 DIAGNOSIS — G629 Polyneuropathy, unspecified: Secondary | ICD-10-CM | POA: Diagnosis not present

## 2020-11-06 DIAGNOSIS — E538 Deficiency of other specified B group vitamins: Secondary | ICD-10-CM | POA: Diagnosis not present

## 2020-11-06 DIAGNOSIS — D509 Iron deficiency anemia, unspecified: Secondary | ICD-10-CM | POA: Diagnosis not present

## 2020-11-06 DIAGNOSIS — K219 Gastro-esophageal reflux disease without esophagitis: Secondary | ICD-10-CM | POA: Diagnosis not present

## 2020-11-06 DIAGNOSIS — E1010 Type 1 diabetes mellitus with ketoacidosis without coma: Secondary | ICD-10-CM | POA: Diagnosis not present

## 2020-11-06 DIAGNOSIS — I10 Essential (primary) hypertension: Secondary | ICD-10-CM | POA: Diagnosis not present

## 2020-11-06 DIAGNOSIS — Z951 Presence of aortocoronary bypass graft: Secondary | ICD-10-CM | POA: Diagnosis not present

## 2020-11-06 DIAGNOSIS — F418 Other specified anxiety disorders: Secondary | ICD-10-CM | POA: Diagnosis not present

## 2020-11-06 DIAGNOSIS — L89620 Pressure ulcer of left heel, unstageable: Secondary | ICD-10-CM | POA: Diagnosis not present

## 2020-11-07 ENCOUNTER — Ambulatory Visit: Payer: Medicare HMO | Admitting: Endocrinology

## 2020-11-07 DIAGNOSIS — L89626 Pressure-induced deep tissue damage of left heel: Secondary | ICD-10-CM | POA: Diagnosis not present

## 2020-11-07 DIAGNOSIS — G9341 Metabolic encephalopathy: Secondary | ICD-10-CM | POA: Diagnosis not present

## 2020-11-07 DIAGNOSIS — E876 Hypokalemia: Secondary | ICD-10-CM | POA: Diagnosis not present

## 2020-11-07 DIAGNOSIS — R112 Nausea with vomiting, unspecified: Secondary | ICD-10-CM | POA: Diagnosis not present

## 2020-11-07 DIAGNOSIS — Z951 Presence of aortocoronary bypass graft: Secondary | ICD-10-CM | POA: Diagnosis not present

## 2020-11-07 DIAGNOSIS — I959 Hypotension, unspecified: Secondary | ICD-10-CM | POA: Diagnosis not present

## 2020-11-07 DIAGNOSIS — N183 Chronic kidney disease, stage 3 unspecified: Secondary | ICD-10-CM | POA: Diagnosis not present

## 2020-11-07 DIAGNOSIS — L89620 Pressure ulcer of left heel, unstageable: Secondary | ICD-10-CM | POA: Diagnosis not present

## 2020-11-07 DIAGNOSIS — E1010 Type 1 diabetes mellitus with ketoacidosis without coma: Secondary | ICD-10-CM | POA: Diagnosis not present

## 2020-11-07 DIAGNOSIS — R791 Abnormal coagulation profile: Secondary | ICD-10-CM | POA: Diagnosis not present

## 2020-11-07 DIAGNOSIS — K3184 Gastroparesis: Secondary | ICD-10-CM | POA: Diagnosis not present

## 2020-11-07 DIAGNOSIS — J811 Chronic pulmonary edema: Secondary | ICD-10-CM | POA: Diagnosis not present

## 2020-11-07 DIAGNOSIS — E538 Deficiency of other specified B group vitamins: Secondary | ICD-10-CM | POA: Diagnosis not present

## 2020-11-07 DIAGNOSIS — Z4789 Encounter for other orthopedic aftercare: Secondary | ICD-10-CM | POA: Diagnosis not present

## 2020-11-07 DIAGNOSIS — R0689 Other abnormalities of breathing: Secondary | ICD-10-CM | POA: Diagnosis not present

## 2020-11-07 DIAGNOSIS — S82201D Unspecified fracture of shaft of right tibia, subsequent encounter for closed fracture with routine healing: Secondary | ICD-10-CM | POA: Diagnosis not present

## 2020-11-07 DIAGNOSIS — E1043 Type 1 diabetes mellitus with diabetic autonomic (poly)neuropathy: Secondary | ICD-10-CM | POA: Diagnosis not present

## 2020-11-07 DIAGNOSIS — I10 Essential (primary) hypertension: Secondary | ICD-10-CM | POA: Diagnosis not present

## 2020-11-07 DIAGNOSIS — F419 Anxiety disorder, unspecified: Secondary | ICD-10-CM | POA: Diagnosis not present

## 2020-11-07 DIAGNOSIS — E109 Type 1 diabetes mellitus without complications: Secondary | ICD-10-CM | POA: Diagnosis not present

## 2020-11-07 DIAGNOSIS — E1065 Type 1 diabetes mellitus with hyperglycemia: Secondary | ICD-10-CM | POA: Diagnosis not present

## 2020-11-07 DIAGNOSIS — L89610 Pressure ulcer of right heel, unstageable: Secondary | ICD-10-CM | POA: Diagnosis not present

## 2020-11-07 DIAGNOSIS — S82401D Unspecified fracture of shaft of right fibula, subsequent encounter for closed fracture with routine healing: Secondary | ICD-10-CM | POA: Diagnosis not present

## 2020-11-07 DIAGNOSIS — K219 Gastro-esophageal reflux disease without esophagitis: Secondary | ICD-10-CM | POA: Diagnosis not present

## 2020-11-07 DIAGNOSIS — D509 Iron deficiency anemia, unspecified: Secondary | ICD-10-CM | POA: Diagnosis not present

## 2020-11-07 DIAGNOSIS — R404 Transient alteration of awareness: Secondary | ICD-10-CM | POA: Diagnosis not present

## 2020-11-07 DIAGNOSIS — E860 Dehydration: Secondary | ICD-10-CM | POA: Diagnosis not present

## 2020-11-07 DIAGNOSIS — G629 Polyneuropathy, unspecified: Secondary | ICD-10-CM | POA: Diagnosis not present

## 2020-11-07 DIAGNOSIS — R4182 Altered mental status, unspecified: Secondary | ICD-10-CM | POA: Diagnosis not present

## 2020-11-07 DIAGNOSIS — F418 Other specified anxiety disorders: Secondary | ICD-10-CM | POA: Diagnosis not present

## 2020-11-07 DIAGNOSIS — R0602 Shortness of breath: Secondary | ICD-10-CM | POA: Diagnosis not present

## 2020-11-07 DIAGNOSIS — Z743 Need for continuous supervision: Secondary | ICD-10-CM | POA: Diagnosis not present

## 2020-11-07 DIAGNOSIS — N179 Acute kidney failure, unspecified: Secondary | ICD-10-CM | POA: Diagnosis not present

## 2020-11-07 DIAGNOSIS — I700 Atherosclerosis of aorta: Secondary | ICD-10-CM | POA: Diagnosis not present

## 2020-11-07 DIAGNOSIS — R231 Pallor: Secondary | ICD-10-CM | POA: Diagnosis not present

## 2020-11-07 DIAGNOSIS — R6889 Other general symptoms and signs: Secondary | ICD-10-CM | POA: Diagnosis not present

## 2020-11-07 DIAGNOSIS — W19XXXA Unspecified fall, initial encounter: Secondary | ICD-10-CM | POA: Diagnosis not present

## 2020-11-07 DIAGNOSIS — R1310 Dysphagia, unspecified: Secondary | ICD-10-CM | POA: Diagnosis not present

## 2020-11-07 DIAGNOSIS — E785 Hyperlipidemia, unspecified: Secondary | ICD-10-CM | POA: Diagnosis not present

## 2020-11-08 DIAGNOSIS — L89610 Pressure ulcer of right heel, unstageable: Secondary | ICD-10-CM | POA: Diagnosis not present

## 2020-11-10 DIAGNOSIS — K3184 Gastroparesis: Secondary | ICD-10-CM | POA: Diagnosis not present

## 2020-11-10 DIAGNOSIS — E1010 Type 1 diabetes mellitus with ketoacidosis without coma: Secondary | ICD-10-CM | POA: Diagnosis not present

## 2020-11-10 DIAGNOSIS — D509 Iron deficiency anemia, unspecified: Secondary | ICD-10-CM | POA: Diagnosis not present

## 2020-11-10 DIAGNOSIS — E538 Deficiency of other specified B group vitamins: Secondary | ICD-10-CM | POA: Diagnosis not present

## 2020-11-10 DIAGNOSIS — G629 Polyneuropathy, unspecified: Secondary | ICD-10-CM | POA: Diagnosis not present

## 2020-11-10 DIAGNOSIS — E109 Type 1 diabetes mellitus without complications: Secondary | ICD-10-CM | POA: Diagnosis not present

## 2020-11-10 DIAGNOSIS — R112 Nausea with vomiting, unspecified: Secondary | ICD-10-CM | POA: Diagnosis not present

## 2020-11-12 DIAGNOSIS — I10 Essential (primary) hypertension: Secondary | ICD-10-CM | POA: Diagnosis not present

## 2020-11-12 DIAGNOSIS — Z951 Presence of aortocoronary bypass graft: Secondary | ICD-10-CM | POA: Diagnosis not present

## 2020-11-12 DIAGNOSIS — G9341 Metabolic encephalopathy: Secondary | ICD-10-CM | POA: Diagnosis not present

## 2020-11-12 DIAGNOSIS — J9811 Atelectasis: Secondary | ICD-10-CM | POA: Diagnosis not present

## 2020-11-12 DIAGNOSIS — R0602 Shortness of breath: Secondary | ICD-10-CM | POA: Diagnosis not present

## 2020-11-12 DIAGNOSIS — R000 Tachycardia, unspecified: Secondary | ICD-10-CM | POA: Diagnosis not present

## 2020-11-12 DIAGNOSIS — L89620 Pressure ulcer of left heel, unstageable: Secondary | ICD-10-CM | POA: Diagnosis not present

## 2020-11-12 DIAGNOSIS — N183 Chronic kidney disease, stage 3 unspecified: Secondary | ICD-10-CM | POA: Diagnosis not present

## 2020-11-12 DIAGNOSIS — R2243 Localized swelling, mass and lump, lower limb, bilateral: Secondary | ICD-10-CM | POA: Diagnosis not present

## 2020-11-12 DIAGNOSIS — R9431 Abnormal electrocardiogram [ECG] [EKG]: Secondary | ICD-10-CM | POA: Diagnosis not present

## 2020-11-12 DIAGNOSIS — E876 Hypokalemia: Secondary | ICD-10-CM | POA: Diagnosis not present

## 2020-11-12 DIAGNOSIS — R1310 Dysphagia, unspecified: Secondary | ICD-10-CM | POA: Diagnosis not present

## 2020-11-12 DIAGNOSIS — R231 Pallor: Secondary | ICD-10-CM | POA: Diagnosis not present

## 2020-11-12 DIAGNOSIS — Z8639 Personal history of other endocrine, nutritional and metabolic disease: Secondary | ICD-10-CM | POA: Diagnosis not present

## 2020-11-12 DIAGNOSIS — Z743 Need for continuous supervision: Secondary | ICD-10-CM | POA: Diagnosis not present

## 2020-11-12 DIAGNOSIS — F419 Anxiety disorder, unspecified: Secondary | ICD-10-CM | POA: Diagnosis not present

## 2020-11-12 DIAGNOSIS — E860 Dehydration: Secondary | ICD-10-CM | POA: Diagnosis not present

## 2020-11-12 DIAGNOSIS — J811 Chronic pulmonary edema: Secondary | ICD-10-CM | POA: Diagnosis not present

## 2020-11-12 DIAGNOSIS — R6889 Other general symptoms and signs: Secondary | ICD-10-CM | POA: Diagnosis not present

## 2020-11-12 DIAGNOSIS — S82201D Unspecified fracture of shaft of right tibia, subsequent encounter for closed fracture with routine healing: Secondary | ICD-10-CM | POA: Diagnosis not present

## 2020-11-12 DIAGNOSIS — E1065 Type 1 diabetes mellitus with hyperglycemia: Secondary | ICD-10-CM | POA: Diagnosis not present

## 2020-11-12 DIAGNOSIS — R410 Disorientation, unspecified: Secondary | ICD-10-CM | POA: Diagnosis not present

## 2020-11-12 DIAGNOSIS — N179 Acute kidney failure, unspecified: Secondary | ICD-10-CM | POA: Diagnosis not present

## 2020-11-12 DIAGNOSIS — Z4789 Encounter for other orthopedic aftercare: Secondary | ICD-10-CM | POA: Diagnosis not present

## 2020-11-12 DIAGNOSIS — D509 Iron deficiency anemia, unspecified: Secondary | ICD-10-CM | POA: Diagnosis not present

## 2020-11-12 DIAGNOSIS — R195 Other fecal abnormalities: Secondary | ICD-10-CM | POA: Diagnosis not present

## 2020-11-12 DIAGNOSIS — K3184 Gastroparesis: Secondary | ICD-10-CM | POA: Diagnosis not present

## 2020-11-12 DIAGNOSIS — E1143 Type 2 diabetes mellitus with diabetic autonomic (poly)neuropathy: Secondary | ICD-10-CM | POA: Diagnosis not present

## 2020-11-12 DIAGNOSIS — I517 Cardiomegaly: Secondary | ICD-10-CM | POA: Diagnosis not present

## 2020-11-12 DIAGNOSIS — E1043 Type 1 diabetes mellitus with diabetic autonomic (poly)neuropathy: Secondary | ICD-10-CM | POA: Diagnosis not present

## 2020-11-12 DIAGNOSIS — R4182 Altered mental status, unspecified: Secondary | ICD-10-CM | POA: Diagnosis not present

## 2020-11-12 DIAGNOSIS — S82401D Unspecified fracture of shaft of right fibula, subsequent encounter for closed fracture with routine healing: Secondary | ICD-10-CM | POA: Diagnosis not present

## 2020-11-12 DIAGNOSIS — G9340 Encephalopathy, unspecified: Secondary | ICD-10-CM | POA: Diagnosis not present

## 2020-11-12 DIAGNOSIS — R2233 Localized swelling, mass and lump, upper limb, bilateral: Secondary | ICD-10-CM | POA: Diagnosis not present

## 2020-11-12 DIAGNOSIS — R791 Abnormal coagulation profile: Secondary | ICD-10-CM | POA: Diagnosis not present

## 2020-11-12 DIAGNOSIS — E785 Hyperlipidemia, unspecified: Secondary | ICD-10-CM | POA: Diagnosis not present

## 2020-11-12 DIAGNOSIS — N182 Chronic kidney disease, stage 2 (mild): Secondary | ICD-10-CM | POA: Diagnosis not present

## 2020-11-12 DIAGNOSIS — R0689 Other abnormalities of breathing: Secondary | ICD-10-CM | POA: Diagnosis not present

## 2020-11-12 DIAGNOSIS — R404 Transient alteration of awareness: Secondary | ICD-10-CM | POA: Diagnosis not present

## 2020-11-12 DIAGNOSIS — K219 Gastro-esophageal reflux disease without esophagitis: Secondary | ICD-10-CM | POA: Diagnosis not present

## 2020-11-12 DIAGNOSIS — Z452 Encounter for adjustment and management of vascular access device: Secondary | ICD-10-CM | POA: Diagnosis not present

## 2020-11-12 DIAGNOSIS — R7989 Other specified abnormal findings of blood chemistry: Secondary | ICD-10-CM | POA: Diagnosis not present

## 2020-11-12 DIAGNOSIS — I700 Atherosclerosis of aorta: Secondary | ICD-10-CM | POA: Diagnosis not present

## 2020-11-12 DIAGNOSIS — E878 Other disorders of electrolyte and fluid balance, not elsewhere classified: Secondary | ICD-10-CM | POA: Diagnosis not present

## 2020-11-12 DIAGNOSIS — E10621 Type 1 diabetes mellitus with foot ulcer: Secondary | ICD-10-CM | POA: Diagnosis not present

## 2020-11-12 DIAGNOSIS — L89626 Pressure-induced deep tissue damage of left heel: Secondary | ICD-10-CM | POA: Diagnosis not present

## 2020-11-12 DIAGNOSIS — I129 Hypertensive chronic kidney disease with stage 1 through stage 4 chronic kidney disease, or unspecified chronic kidney disease: Secondary | ICD-10-CM | POA: Diagnosis not present

## 2020-11-13 DIAGNOSIS — I129 Hypertensive chronic kidney disease with stage 1 through stage 4 chronic kidney disease, or unspecified chronic kidney disease: Secondary | ICD-10-CM | POA: Diagnosis not present

## 2020-11-13 DIAGNOSIS — R9431 Abnormal electrocardiogram [ECG] [EKG]: Secondary | ICD-10-CM | POA: Diagnosis not present

## 2020-11-13 DIAGNOSIS — R791 Abnormal coagulation profile: Secondary | ICD-10-CM | POA: Diagnosis not present

## 2020-11-13 DIAGNOSIS — R195 Other fecal abnormalities: Secondary | ICD-10-CM | POA: Diagnosis not present

## 2020-11-13 DIAGNOSIS — L89620 Pressure ulcer of left heel, unstageable: Secondary | ICD-10-CM | POA: Diagnosis not present

## 2020-11-13 DIAGNOSIS — R4182 Altered mental status, unspecified: Secondary | ICD-10-CM | POA: Diagnosis not present

## 2020-11-13 DIAGNOSIS — I10 Essential (primary) hypertension: Secondary | ICD-10-CM | POA: Diagnosis not present

## 2020-11-13 DIAGNOSIS — N182 Chronic kidney disease, stage 2 (mild): Secondary | ICD-10-CM | POA: Diagnosis not present

## 2020-11-13 DIAGNOSIS — Z8639 Personal history of other endocrine, nutritional and metabolic disease: Secondary | ICD-10-CM | POA: Diagnosis not present

## 2020-11-13 DIAGNOSIS — N179 Acute kidney failure, unspecified: Secondary | ICD-10-CM | POA: Diagnosis not present

## 2020-11-14 DIAGNOSIS — N182 Chronic kidney disease, stage 2 (mild): Secondary | ICD-10-CM | POA: Diagnosis not present

## 2020-11-14 DIAGNOSIS — R2233 Localized swelling, mass and lump, upper limb, bilateral: Secondary | ICD-10-CM | POA: Diagnosis not present

## 2020-11-14 DIAGNOSIS — R195 Other fecal abnormalities: Secondary | ICD-10-CM | POA: Diagnosis not present

## 2020-11-14 DIAGNOSIS — I10 Essential (primary) hypertension: Secondary | ICD-10-CM | POA: Diagnosis not present

## 2020-11-14 DIAGNOSIS — K3184 Gastroparesis: Secondary | ICD-10-CM | POA: Diagnosis not present

## 2020-11-14 DIAGNOSIS — R000 Tachycardia, unspecified: Secondary | ICD-10-CM | POA: Diagnosis not present

## 2020-11-14 DIAGNOSIS — R4182 Altered mental status, unspecified: Secondary | ICD-10-CM | POA: Diagnosis not present

## 2020-11-14 DIAGNOSIS — R7989 Other specified abnormal findings of blood chemistry: Secondary | ICD-10-CM | POA: Diagnosis not present

## 2020-11-14 DIAGNOSIS — J9811 Atelectasis: Secondary | ICD-10-CM | POA: Diagnosis not present

## 2020-11-14 DIAGNOSIS — N179 Acute kidney failure, unspecified: Secondary | ICD-10-CM | POA: Diagnosis not present

## 2020-11-14 DIAGNOSIS — I129 Hypertensive chronic kidney disease with stage 1 through stage 4 chronic kidney disease, or unspecified chronic kidney disease: Secondary | ICD-10-CM | POA: Diagnosis not present

## 2020-11-14 DIAGNOSIS — L89620 Pressure ulcer of left heel, unstageable: Secondary | ICD-10-CM | POA: Diagnosis not present

## 2020-11-14 DIAGNOSIS — Z452 Encounter for adjustment and management of vascular access device: Secondary | ICD-10-CM | POA: Diagnosis not present

## 2020-11-14 DIAGNOSIS — E1143 Type 2 diabetes mellitus with diabetic autonomic (poly)neuropathy: Secondary | ICD-10-CM | POA: Diagnosis not present

## 2020-11-14 DIAGNOSIS — D509 Iron deficiency anemia, unspecified: Secondary | ICD-10-CM | POA: Diagnosis not present

## 2020-11-14 DIAGNOSIS — R2243 Localized swelling, mass and lump, lower limb, bilateral: Secondary | ICD-10-CM | POA: Diagnosis not present

## 2020-11-14 DIAGNOSIS — Z8639 Personal history of other endocrine, nutritional and metabolic disease: Secondary | ICD-10-CM | POA: Diagnosis not present

## 2020-11-14 DIAGNOSIS — I517 Cardiomegaly: Secondary | ICD-10-CM | POA: Diagnosis not present

## 2020-11-14 DIAGNOSIS — R791 Abnormal coagulation profile: Secondary | ICD-10-CM | POA: Diagnosis not present

## 2020-11-15 ENCOUNTER — Other Ambulatory Visit: Payer: Self-pay

## 2020-11-15 DIAGNOSIS — K3184 Gastroparesis: Secondary | ICD-10-CM | POA: Diagnosis not present

## 2020-11-15 DIAGNOSIS — R195 Other fecal abnormalities: Secondary | ICD-10-CM | POA: Diagnosis not present

## 2020-11-15 DIAGNOSIS — E878 Other disorders of electrolyte and fluid balance, not elsewhere classified: Secondary | ICD-10-CM | POA: Diagnosis not present

## 2020-11-15 DIAGNOSIS — E785 Hyperlipidemia, unspecified: Secondary | ICD-10-CM | POA: Diagnosis not present

## 2020-11-15 DIAGNOSIS — G9340 Encephalopathy, unspecified: Secondary | ICD-10-CM | POA: Diagnosis not present

## 2020-11-15 DIAGNOSIS — L89620 Pressure ulcer of left heel, unstageable: Secondary | ICD-10-CM | POA: Diagnosis not present

## 2020-11-15 DIAGNOSIS — R791 Abnormal coagulation profile: Secondary | ICD-10-CM | POA: Diagnosis not present

## 2020-11-15 DIAGNOSIS — I10 Essential (primary) hypertension: Secondary | ICD-10-CM | POA: Diagnosis not present

## 2020-11-15 DIAGNOSIS — E10621 Type 1 diabetes mellitus with foot ulcer: Secondary | ICD-10-CM | POA: Diagnosis not present

## 2020-11-15 NOTE — Patient Outreach (Signed)
[facility]) Care Management  11/15/2020  [name] [birth year] [id number]   Referral Date: 11/15/20 Referral Source: [employer] Report Date of Discharge: 11/12/20 Facility:  [place]: [facility]   Referral received.  No outreach warranted at this time.  Patient currently hospitalized.    Plan: RN CM will close case.    [name], RN, MSN [facility] Care Management Care Management Coordinator Direct Line [phone] Toll Free: [phone]  Fax: [phone]

## 2020-11-16 DIAGNOSIS — E10621 Type 1 diabetes mellitus with foot ulcer: Secondary | ICD-10-CM | POA: Diagnosis not present

## 2020-11-16 DIAGNOSIS — L89620 Pressure ulcer of left heel, unstageable: Secondary | ICD-10-CM | POA: Diagnosis not present

## 2020-11-16 DIAGNOSIS — E785 Hyperlipidemia, unspecified: Secondary | ICD-10-CM | POA: Diagnosis not present

## 2020-11-16 DIAGNOSIS — K3184 Gastroparesis: Secondary | ICD-10-CM | POA: Diagnosis not present

## 2020-11-16 DIAGNOSIS — R791 Abnormal coagulation profile: Secondary | ICD-10-CM | POA: Diagnosis not present

## 2020-11-16 DIAGNOSIS — R195 Other fecal abnormalities: Secondary | ICD-10-CM | POA: Diagnosis not present

## 2020-11-16 DIAGNOSIS — E878 Other disorders of electrolyte and fluid balance, not elsewhere classified: Secondary | ICD-10-CM | POA: Diagnosis not present

## 2020-11-16 DIAGNOSIS — I10 Essential (primary) hypertension: Secondary | ICD-10-CM | POA: Diagnosis not present

## 2020-11-17 DIAGNOSIS — E10621 Type 1 diabetes mellitus with foot ulcer: Secondary | ICD-10-CM | POA: Diagnosis not present

## 2020-11-17 DIAGNOSIS — E878 Other disorders of electrolyte and fluid balance, not elsewhere classified: Secondary | ICD-10-CM | POA: Diagnosis not present

## 2020-11-17 DIAGNOSIS — R791 Abnormal coagulation profile: Secondary | ICD-10-CM | POA: Diagnosis not present

## 2020-11-17 DIAGNOSIS — I10 Essential (primary) hypertension: Secondary | ICD-10-CM | POA: Diagnosis not present

## 2020-11-17 DIAGNOSIS — E785 Hyperlipidemia, unspecified: Secondary | ICD-10-CM | POA: Diagnosis not present

## 2020-11-17 DIAGNOSIS — K3184 Gastroparesis: Secondary | ICD-10-CM | POA: Diagnosis not present

## 2020-11-18 DIAGNOSIS — K3184 Gastroparesis: Secondary | ICD-10-CM | POA: Diagnosis not present

## 2020-11-18 DIAGNOSIS — R791 Abnormal coagulation profile: Secondary | ICD-10-CM | POA: Diagnosis not present

## 2020-11-18 DIAGNOSIS — I10 Essential (primary) hypertension: Secondary | ICD-10-CM | POA: Diagnosis not present

## 2020-11-18 DIAGNOSIS — E878 Other disorders of electrolyte and fluid balance, not elsewhere classified: Secondary | ICD-10-CM | POA: Diagnosis not present

## 2020-11-18 DIAGNOSIS — E785 Hyperlipidemia, unspecified: Secondary | ICD-10-CM | POA: Diagnosis not present

## 2020-11-18 DIAGNOSIS — E10621 Type 1 diabetes mellitus with foot ulcer: Secondary | ICD-10-CM | POA: Diagnosis not present

## 2020-11-19 DIAGNOSIS — E878 Other disorders of electrolyte and fluid balance, not elsewhere classified: Secondary | ICD-10-CM | POA: Diagnosis not present

## 2020-11-19 DIAGNOSIS — K3184 Gastroparesis: Secondary | ICD-10-CM | POA: Diagnosis not present

## 2020-11-19 DIAGNOSIS — E10621 Type 1 diabetes mellitus with foot ulcer: Secondary | ICD-10-CM | POA: Diagnosis not present

## 2020-11-19 DIAGNOSIS — R791 Abnormal coagulation profile: Secondary | ICD-10-CM | POA: Diagnosis not present

## 2020-11-19 DIAGNOSIS — I10 Essential (primary) hypertension: Secondary | ICD-10-CM | POA: Diagnosis not present

## 2020-11-19 DIAGNOSIS — E785 Hyperlipidemia, unspecified: Secondary | ICD-10-CM | POA: Diagnosis not present

## 2020-11-20 DIAGNOSIS — R791 Abnormal coagulation profile: Secondary | ICD-10-CM | POA: Diagnosis not present

## 2020-11-20 DIAGNOSIS — E878 Other disorders of electrolyte and fluid balance, not elsewhere classified: Secondary | ICD-10-CM | POA: Diagnosis not present

## 2020-11-20 DIAGNOSIS — E10621 Type 1 diabetes mellitus with foot ulcer: Secondary | ICD-10-CM | POA: Diagnosis not present

## 2020-11-20 DIAGNOSIS — K3184 Gastroparesis: Secondary | ICD-10-CM | POA: Diagnosis not present

## 2020-11-20 DIAGNOSIS — E785 Hyperlipidemia, unspecified: Secondary | ICD-10-CM | POA: Diagnosis not present

## 2020-11-20 DIAGNOSIS — I10 Essential (primary) hypertension: Secondary | ICD-10-CM | POA: Diagnosis not present

## 2020-11-21 DIAGNOSIS — S82401D Unspecified fracture of shaft of right fibula, subsequent encounter for closed fracture with routine healing: Secondary | ICD-10-CM | POA: Diagnosis not present

## 2020-11-21 DIAGNOSIS — R110 Nausea: Secondary | ICD-10-CM | POA: Diagnosis not present

## 2020-11-21 DIAGNOSIS — D519 Vitamin B12 deficiency anemia, unspecified: Secondary | ICD-10-CM | POA: Diagnosis not present

## 2020-11-21 DIAGNOSIS — E878 Other disorders of electrolyte and fluid balance, not elsewhere classified: Secondary | ICD-10-CM | POA: Diagnosis not present

## 2020-11-21 DIAGNOSIS — R195 Other fecal abnormalities: Secondary | ICD-10-CM | POA: Diagnosis not present

## 2020-11-21 DIAGNOSIS — G629 Polyneuropathy, unspecified: Secondary | ICD-10-CM | POA: Diagnosis not present

## 2020-11-21 DIAGNOSIS — I700 Atherosclerosis of aorta: Secondary | ICD-10-CM | POA: Diagnosis not present

## 2020-11-21 DIAGNOSIS — K5900 Constipation, unspecified: Secondary | ICD-10-CM | POA: Diagnosis not present

## 2020-11-21 DIAGNOSIS — E1010 Type 1 diabetes mellitus with ketoacidosis without coma: Secondary | ICD-10-CM | POA: Diagnosis not present

## 2020-11-21 DIAGNOSIS — K3184 Gastroparesis: Secondary | ICD-10-CM | POA: Diagnosis not present

## 2020-11-21 DIAGNOSIS — E1143 Type 2 diabetes mellitus with diabetic autonomic (poly)neuropathy: Secondary | ICD-10-CM | POA: Diagnosis not present

## 2020-11-21 DIAGNOSIS — E559 Vitamin D deficiency, unspecified: Secondary | ICD-10-CM | POA: Diagnosis not present

## 2020-11-21 DIAGNOSIS — R000 Tachycardia, unspecified: Secondary | ICD-10-CM | POA: Diagnosis not present

## 2020-11-21 DIAGNOSIS — E10621 Type 1 diabetes mellitus with foot ulcer: Secondary | ICD-10-CM | POA: Diagnosis not present

## 2020-11-21 DIAGNOSIS — N3289 Other specified disorders of bladder: Secondary | ICD-10-CM | POA: Diagnosis not present

## 2020-11-21 DIAGNOSIS — L89626 Pressure-induced deep tissue damage of left heel: Secondary | ICD-10-CM | POA: Diagnosis not present

## 2020-11-21 DIAGNOSIS — S82201D Unspecified fracture of shaft of right tibia, subsequent encounter for closed fracture with routine healing: Secondary | ICD-10-CM | POA: Diagnosis not present

## 2020-11-21 DIAGNOSIS — D649 Anemia, unspecified: Secondary | ICD-10-CM | POA: Diagnosis not present

## 2020-11-21 DIAGNOSIS — N183 Chronic kidney disease, stage 3 unspecified: Secondary | ICD-10-CM | POA: Diagnosis not present

## 2020-11-21 DIAGNOSIS — R338 Other retention of urine: Secondary | ICD-10-CM | POA: Diagnosis not present

## 2020-11-21 DIAGNOSIS — K449 Diaphragmatic hernia without obstruction or gangrene: Secondary | ICD-10-CM | POA: Diagnosis not present

## 2020-11-21 DIAGNOSIS — Z794 Long term (current) use of insulin: Secondary | ICD-10-CM | POA: Diagnosis not present

## 2020-11-21 DIAGNOSIS — R1032 Left lower quadrant pain: Secondary | ICD-10-CM | POA: Diagnosis not present

## 2020-11-21 DIAGNOSIS — E860 Dehydration: Secondary | ICD-10-CM | POA: Diagnosis not present

## 2020-11-21 DIAGNOSIS — E1043 Type 1 diabetes mellitus with diabetic autonomic (poly)neuropathy: Secondary | ICD-10-CM | POA: Diagnosis not present

## 2020-11-21 DIAGNOSIS — R6889 Other general symptoms and signs: Secondary | ICD-10-CM | POA: Diagnosis not present

## 2020-11-21 DIAGNOSIS — Z743 Need for continuous supervision: Secondary | ICD-10-CM | POA: Diagnosis not present

## 2020-11-21 DIAGNOSIS — R739 Hyperglycemia, unspecified: Secondary | ICD-10-CM | POA: Diagnosis not present

## 2020-11-21 DIAGNOSIS — I5022 Chronic systolic (congestive) heart failure: Secondary | ICD-10-CM | POA: Diagnosis not present

## 2020-11-21 DIAGNOSIS — E119 Type 2 diabetes mellitus without complications: Secondary | ICD-10-CM | POA: Diagnosis not present

## 2020-11-21 DIAGNOSIS — R7989 Other specified abnormal findings of blood chemistry: Secondary | ICD-10-CM | POA: Diagnosis not present

## 2020-11-21 DIAGNOSIS — N179 Acute kidney failure, unspecified: Secondary | ICD-10-CM | POA: Diagnosis not present

## 2020-11-21 DIAGNOSIS — F32A Depression, unspecified: Secondary | ICD-10-CM | POA: Diagnosis not present

## 2020-11-21 DIAGNOSIS — E1065 Type 1 diabetes mellitus with hyperglycemia: Secondary | ICD-10-CM | POA: Diagnosis not present

## 2020-11-21 DIAGNOSIS — K219 Gastro-esophageal reflux disease without esophagitis: Secondary | ICD-10-CM | POA: Diagnosis not present

## 2020-11-21 DIAGNOSIS — R4182 Altered mental status, unspecified: Secondary | ICD-10-CM | POA: Diagnosis not present

## 2020-11-21 DIAGNOSIS — R109 Unspecified abdominal pain: Secondary | ICD-10-CM | POA: Diagnosis not present

## 2020-11-21 DIAGNOSIS — R197 Diarrhea, unspecified: Secondary | ICD-10-CM | POA: Diagnosis not present

## 2020-11-21 DIAGNOSIS — E538 Deficiency of other specified B group vitamins: Secondary | ICD-10-CM | POA: Diagnosis not present

## 2020-11-21 DIAGNOSIS — I10 Essential (primary) hypertension: Secondary | ICD-10-CM | POA: Diagnosis not present

## 2020-11-21 DIAGNOSIS — R791 Abnormal coagulation profile: Secondary | ICD-10-CM | POA: Diagnosis not present

## 2020-11-21 DIAGNOSIS — Z7401 Bed confinement status: Secondary | ICD-10-CM | POA: Diagnosis not present

## 2020-11-21 DIAGNOSIS — E10649 Type 1 diabetes mellitus with hypoglycemia without coma: Secondary | ICD-10-CM | POA: Diagnosis not present

## 2020-11-21 DIAGNOSIS — R262 Difficulty in walking, not elsewhere classified: Secondary | ICD-10-CM | POA: Diagnosis not present

## 2020-11-21 DIAGNOSIS — Z951 Presence of aortocoronary bypass graft: Secondary | ICD-10-CM | POA: Diagnosis not present

## 2020-11-21 DIAGNOSIS — E109 Type 1 diabetes mellitus without complications: Secondary | ICD-10-CM | POA: Diagnosis not present

## 2020-11-21 DIAGNOSIS — K5901 Slow transit constipation: Secondary | ICD-10-CM | POA: Diagnosis not present

## 2020-11-21 DIAGNOSIS — Z4789 Encounter for other orthopedic aftercare: Secondary | ICD-10-CM | POA: Diagnosis not present

## 2020-11-21 DIAGNOSIS — F419 Anxiety disorder, unspecified: Secondary | ICD-10-CM | POA: Diagnosis not present

## 2020-11-21 DIAGNOSIS — N200 Calculus of kidney: Secondary | ICD-10-CM | POA: Diagnosis not present

## 2020-11-21 DIAGNOSIS — E785 Hyperlipidemia, unspecified: Secondary | ICD-10-CM | POA: Diagnosis not present

## 2020-11-21 DIAGNOSIS — E1021 Type 1 diabetes mellitus with diabetic nephropathy: Secondary | ICD-10-CM | POA: Diagnosis not present

## 2020-11-21 DIAGNOSIS — R410 Disorientation, unspecified: Secondary | ICD-10-CM | POA: Diagnosis not present

## 2020-11-21 DIAGNOSIS — R5381 Other malaise: Secondary | ICD-10-CM | POA: Diagnosis not present

## 2020-11-21 DIAGNOSIS — R112 Nausea with vomiting, unspecified: Secondary | ICD-10-CM | POA: Diagnosis not present

## 2020-11-22 DIAGNOSIS — E785 Hyperlipidemia, unspecified: Secondary | ICD-10-CM | POA: Diagnosis not present

## 2020-11-22 DIAGNOSIS — I10 Essential (primary) hypertension: Secondary | ICD-10-CM | POA: Diagnosis not present

## 2020-11-22 DIAGNOSIS — R7989 Other specified abnormal findings of blood chemistry: Secondary | ICD-10-CM | POA: Diagnosis not present

## 2020-11-22 DIAGNOSIS — R4182 Altered mental status, unspecified: Secondary | ICD-10-CM | POA: Diagnosis not present

## 2020-11-22 DIAGNOSIS — E1043 Type 1 diabetes mellitus with diabetic autonomic (poly)neuropathy: Secondary | ICD-10-CM | POA: Diagnosis not present

## 2020-11-22 DIAGNOSIS — R195 Other fecal abnormalities: Secondary | ICD-10-CM | POA: Diagnosis not present

## 2020-11-22 DIAGNOSIS — K219 Gastro-esophageal reflux disease without esophagitis: Secondary | ICD-10-CM | POA: Diagnosis not present

## 2020-11-22 DIAGNOSIS — K3184 Gastroparesis: Secondary | ICD-10-CM | POA: Diagnosis not present

## 2020-11-29 DIAGNOSIS — R110 Nausea: Secondary | ICD-10-CM | POA: Diagnosis not present

## 2020-11-29 DIAGNOSIS — F419 Anxiety disorder, unspecified: Secondary | ICD-10-CM | POA: Diagnosis not present

## 2020-11-29 DIAGNOSIS — E109 Type 1 diabetes mellitus without complications: Secondary | ICD-10-CM | POA: Diagnosis not present

## 2020-11-29 DIAGNOSIS — K5900 Constipation, unspecified: Secondary | ICD-10-CM | POA: Diagnosis not present

## 2020-11-29 DIAGNOSIS — I10 Essential (primary) hypertension: Secondary | ICD-10-CM | POA: Diagnosis not present

## 2020-11-29 DIAGNOSIS — R739 Hyperglycemia, unspecified: Secondary | ICD-10-CM | POA: Diagnosis not present

## 2020-11-29 DIAGNOSIS — I5022 Chronic systolic (congestive) heart failure: Secondary | ICD-10-CM | POA: Diagnosis not present

## 2020-11-29 DIAGNOSIS — K3184 Gastroparesis: Secondary | ICD-10-CM | POA: Diagnosis not present

## 2020-11-30 DIAGNOSIS — M6701 Short Achilles tendon (acquired), right ankle: Secondary | ICD-10-CM | POA: Insufficient documentation

## 2020-11-30 DIAGNOSIS — Z4789 Encounter for other orthopedic aftercare: Secondary | ICD-10-CM | POA: Diagnosis not present

## 2020-12-01 DIAGNOSIS — R112 Nausea with vomiting, unspecified: Secondary | ICD-10-CM | POA: Diagnosis not present

## 2020-12-01 DIAGNOSIS — R195 Other fecal abnormalities: Secondary | ICD-10-CM | POA: Diagnosis not present

## 2020-12-01 DIAGNOSIS — R110 Nausea: Secondary | ICD-10-CM | POA: Diagnosis not present

## 2020-12-01 DIAGNOSIS — R1032 Left lower quadrant pain: Secondary | ICD-10-CM | POA: Diagnosis not present

## 2020-12-01 DIAGNOSIS — E1143 Type 2 diabetes mellitus with diabetic autonomic (poly)neuropathy: Secondary | ICD-10-CM | POA: Diagnosis not present

## 2020-12-01 DIAGNOSIS — Z794 Long term (current) use of insulin: Secondary | ICD-10-CM | POA: Diagnosis not present

## 2020-12-01 DIAGNOSIS — R197 Diarrhea, unspecified: Secondary | ICD-10-CM | POA: Diagnosis not present

## 2020-12-01 DIAGNOSIS — K3184 Gastroparesis: Secondary | ICD-10-CM | POA: Diagnosis not present

## 2020-12-02 DIAGNOSIS — E109 Type 1 diabetes mellitus without complications: Secondary | ICD-10-CM | POA: Diagnosis not present

## 2020-12-02 DIAGNOSIS — R112 Nausea with vomiting, unspecified: Secondary | ICD-10-CM | POA: Diagnosis not present

## 2020-12-02 DIAGNOSIS — K3184 Gastroparesis: Secondary | ICD-10-CM | POA: Diagnosis not present

## 2020-12-02 DIAGNOSIS — R739 Hyperglycemia, unspecified: Secondary | ICD-10-CM | POA: Diagnosis not present

## 2020-12-02 DIAGNOSIS — E538 Deficiency of other specified B group vitamins: Secondary | ICD-10-CM | POA: Diagnosis not present

## 2020-12-04 DIAGNOSIS — Z743 Need for continuous supervision: Secondary | ICD-10-CM | POA: Diagnosis not present

## 2020-12-04 DIAGNOSIS — E1043 Type 1 diabetes mellitus with diabetic autonomic (poly)neuropathy: Secondary | ICD-10-CM | POA: Diagnosis not present

## 2020-12-04 DIAGNOSIS — Z9049 Acquired absence of other specified parts of digestive tract: Secondary | ICD-10-CM | POA: Diagnosis not present

## 2020-12-04 DIAGNOSIS — D649 Anemia, unspecified: Secondary | ICD-10-CM | POA: Diagnosis not present

## 2020-12-04 DIAGNOSIS — R112 Nausea with vomiting, unspecified: Secondary | ICD-10-CM | POA: Diagnosis not present

## 2020-12-04 DIAGNOSIS — I10 Essential (primary) hypertension: Secondary | ICD-10-CM | POA: Diagnosis not present

## 2020-12-04 DIAGNOSIS — R000 Tachycardia, unspecified: Secondary | ICD-10-CM | POA: Diagnosis not present

## 2020-12-04 DIAGNOSIS — E1010 Type 1 diabetes mellitus with ketoacidosis without coma: Secondary | ICD-10-CM | POA: Diagnosis not present

## 2020-12-04 DIAGNOSIS — N3289 Other specified disorders of bladder: Secondary | ICD-10-CM | POA: Diagnosis not present

## 2020-12-04 DIAGNOSIS — F32A Depression, unspecified: Secondary | ICD-10-CM | POA: Diagnosis not present

## 2020-12-04 DIAGNOSIS — K5901 Slow transit constipation: Secondary | ICD-10-CM | POA: Diagnosis not present

## 2020-12-04 DIAGNOSIS — Z9889 Other specified postprocedural states: Secondary | ICD-10-CM | POA: Diagnosis not present

## 2020-12-04 DIAGNOSIS — N3000 Acute cystitis without hematuria: Secondary | ICD-10-CM | POA: Diagnosis not present

## 2020-12-04 DIAGNOSIS — E440 Moderate protein-calorie malnutrition: Secondary | ICD-10-CM | POA: Diagnosis not present

## 2020-12-04 DIAGNOSIS — E860 Dehydration: Secondary | ICD-10-CM | POA: Diagnosis not present

## 2020-12-04 DIAGNOSIS — N3090 Cystitis, unspecified without hematuria: Secondary | ICD-10-CM | POA: Diagnosis not present

## 2020-12-04 DIAGNOSIS — R339 Retention of urine, unspecified: Secondary | ICD-10-CM | POA: Diagnosis not present

## 2020-12-04 DIAGNOSIS — I2510 Atherosclerotic heart disease of native coronary artery without angina pectoris: Secondary | ICD-10-CM | POA: Diagnosis not present

## 2020-12-04 DIAGNOSIS — K5909 Other constipation: Secondary | ICD-10-CM | POA: Diagnosis not present

## 2020-12-04 DIAGNOSIS — E1143 Type 2 diabetes mellitus with diabetic autonomic (poly)neuropathy: Secondary | ICD-10-CM | POA: Diagnosis not present

## 2020-12-04 DIAGNOSIS — A499 Bacterial infection, unspecified: Secondary | ICD-10-CM | POA: Diagnosis not present

## 2020-12-04 DIAGNOSIS — E1065 Type 1 diabetes mellitus with hyperglycemia: Secondary | ICD-10-CM | POA: Diagnosis not present

## 2020-12-04 DIAGNOSIS — K5900 Constipation, unspecified: Secondary | ICD-10-CM | POA: Diagnosis not present

## 2020-12-04 DIAGNOSIS — S82201D Unspecified fracture of shaft of right tibia, subsequent encounter for closed fracture with routine healing: Secondary | ICD-10-CM | POA: Diagnosis not present

## 2020-12-04 DIAGNOSIS — E1040 Type 1 diabetes mellitus with diabetic neuropathy, unspecified: Secondary | ICD-10-CM | POA: Diagnosis not present

## 2020-12-04 DIAGNOSIS — N200 Calculus of kidney: Secondary | ICD-10-CM | POA: Diagnosis not present

## 2020-12-04 DIAGNOSIS — Z4789 Encounter for other orthopedic aftercare: Secondary | ICD-10-CM | POA: Diagnosis not present

## 2020-12-04 DIAGNOSIS — K469 Unspecified abdominal hernia without obstruction or gangrene: Secondary | ICD-10-CM | POA: Diagnosis not present

## 2020-12-04 DIAGNOSIS — R109 Unspecified abdominal pain: Secondary | ICD-10-CM | POA: Diagnosis not present

## 2020-12-04 DIAGNOSIS — E1021 Type 1 diabetes mellitus with diabetic nephropathy: Secondary | ICD-10-CM | POA: Diagnosis not present

## 2020-12-04 DIAGNOSIS — R195 Other fecal abnormalities: Secondary | ICD-10-CM | POA: Diagnosis not present

## 2020-12-04 DIAGNOSIS — K3184 Gastroparesis: Secondary | ICD-10-CM | POA: Diagnosis not present

## 2020-12-04 DIAGNOSIS — K449 Diaphragmatic hernia without obstruction or gangrene: Secondary | ICD-10-CM | POA: Diagnosis not present

## 2020-12-04 DIAGNOSIS — S82401D Unspecified fracture of shaft of right fibula, subsequent encounter for closed fracture with routine healing: Secondary | ICD-10-CM | POA: Diagnosis not present

## 2020-12-04 DIAGNOSIS — Z951 Presence of aortocoronary bypass graft: Secondary | ICD-10-CM | POA: Diagnosis not present

## 2020-12-04 DIAGNOSIS — L89626 Pressure-induced deep tissue damage of left heel: Secondary | ICD-10-CM | POA: Diagnosis not present

## 2020-12-04 DIAGNOSIS — R338 Other retention of urine: Secondary | ICD-10-CM | POA: Diagnosis not present

## 2020-12-04 DIAGNOSIS — E10649 Type 1 diabetes mellitus with hypoglycemia without coma: Secondary | ICD-10-CM | POA: Diagnosis not present

## 2020-12-04 DIAGNOSIS — R1111 Vomiting without nausea: Secondary | ICD-10-CM | POA: Diagnosis not present

## 2020-12-04 DIAGNOSIS — N179 Acute kidney failure, unspecified: Secondary | ICD-10-CM | POA: Diagnosis not present

## 2020-12-04 DIAGNOSIS — I119 Hypertensive heart disease without heart failure: Secondary | ICD-10-CM | POA: Diagnosis not present

## 2020-12-04 DIAGNOSIS — G629 Polyneuropathy, unspecified: Secondary | ICD-10-CM | POA: Diagnosis not present

## 2020-12-04 DIAGNOSIS — Z7401 Bed confinement status: Secondary | ICD-10-CM | POA: Diagnosis not present

## 2020-12-06 DIAGNOSIS — K5900 Constipation, unspecified: Secondary | ICD-10-CM | POA: Diagnosis not present

## 2020-12-07 ENCOUNTER — Other Ambulatory Visit: Payer: Self-pay

## 2020-12-07 NOTE — Patient Outreach (Signed)
[facility]) Care Management  12/07/2020  Preeya Cleckley 18-Jan-1966 [id number]   Referral Date: 12/07/20 Referral Source: [employer] Report Date of Discharge: 12/04/20 Facility:  [place]: [facility]   Referral received.  No outreach warranted at this time.  Patient currently hospitalized with plans to return to facility after discharge.   Plan: RN CM will close case.    [name], RN, MSN [facility] Care Management Care Management Coordinator Direct Line [phone] Toll Free: [phone]  Fax: [phone]

## 2020-12-16 DIAGNOSIS — R339 Retention of urine, unspecified: Secondary | ICD-10-CM | POA: Diagnosis not present

## 2020-12-16 DIAGNOSIS — R197 Diarrhea, unspecified: Secondary | ICD-10-CM | POA: Diagnosis not present

## 2020-12-16 DIAGNOSIS — E785 Hyperlipidemia, unspecified: Secondary | ICD-10-CM | POA: Diagnosis not present

## 2020-12-16 DIAGNOSIS — E538 Deficiency of other specified B group vitamins: Secondary | ICD-10-CM | POA: Diagnosis not present

## 2020-12-16 DIAGNOSIS — R300 Dysuria: Secondary | ICD-10-CM | POA: Diagnosis not present

## 2020-12-16 DIAGNOSIS — E109 Type 1 diabetes mellitus without complications: Secondary | ICD-10-CM | POA: Diagnosis not present

## 2020-12-16 DIAGNOSIS — E162 Hypoglycemia, unspecified: Secondary | ICD-10-CM | POA: Diagnosis not present

## 2020-12-16 DIAGNOSIS — Z7401 Bed confinement status: Secondary | ICD-10-CM | POA: Diagnosis not present

## 2020-12-16 DIAGNOSIS — R112 Nausea with vomiting, unspecified: Secondary | ICD-10-CM | POA: Diagnosis not present

## 2020-12-16 DIAGNOSIS — E440 Moderate protein-calorie malnutrition: Secondary | ICD-10-CM | POA: Diagnosis not present

## 2020-12-16 DIAGNOSIS — I10 Essential (primary) hypertension: Secondary | ICD-10-CM | POA: Diagnosis not present

## 2020-12-16 DIAGNOSIS — F32A Depression, unspecified: Secondary | ICD-10-CM | POA: Diagnosis not present

## 2020-12-16 DIAGNOSIS — L89620 Pressure ulcer of left heel, unstageable: Secondary | ICD-10-CM | POA: Diagnosis not present

## 2020-12-16 DIAGNOSIS — G629 Polyneuropathy, unspecified: Secondary | ICD-10-CM | POA: Diagnosis not present

## 2020-12-16 DIAGNOSIS — S82201D Unspecified fracture of shaft of right tibia, subsequent encounter for closed fracture with routine healing: Secondary | ICD-10-CM | POA: Diagnosis not present

## 2020-12-16 DIAGNOSIS — K3184 Gastroparesis: Secondary | ICD-10-CM | POA: Diagnosis not present

## 2020-12-16 DIAGNOSIS — Z743 Need for continuous supervision: Secondary | ICD-10-CM | POA: Diagnosis not present

## 2020-12-16 DIAGNOSIS — T148XXD Other injury of unspecified body region, subsequent encounter: Secondary | ICD-10-CM | POA: Diagnosis not present

## 2020-12-16 DIAGNOSIS — N179 Acute kidney failure, unspecified: Secondary | ICD-10-CM | POA: Diagnosis not present

## 2020-12-16 DIAGNOSIS — N390 Urinary tract infection, site not specified: Secondary | ICD-10-CM | POA: Diagnosis not present

## 2020-12-16 DIAGNOSIS — F411 Generalized anxiety disorder: Secondary | ICD-10-CM | POA: Diagnosis not present

## 2020-12-16 DIAGNOSIS — E1065 Type 1 diabetes mellitus with hyperglycemia: Secondary | ICD-10-CM | POA: Diagnosis not present

## 2020-12-16 DIAGNOSIS — N3000 Acute cystitis without hematuria: Secondary | ICD-10-CM | POA: Diagnosis not present

## 2020-12-16 DIAGNOSIS — K219 Gastro-esophageal reflux disease without esophagitis: Secondary | ICD-10-CM | POA: Diagnosis not present

## 2020-12-16 DIAGNOSIS — F4320 Adjustment disorder, unspecified: Secondary | ICD-10-CM | POA: Diagnosis not present

## 2020-12-16 DIAGNOSIS — L89610 Pressure ulcer of right heel, unstageable: Secondary | ICD-10-CM | POA: Diagnosis not present

## 2020-12-16 DIAGNOSIS — E1021 Type 1 diabetes mellitus with diabetic nephropathy: Secondary | ICD-10-CM | POA: Diagnosis not present

## 2020-12-16 DIAGNOSIS — R1111 Vomiting without nausea: Secondary | ICD-10-CM | POA: Diagnosis not present

## 2020-12-16 DIAGNOSIS — Z4789 Encounter for other orthopedic aftercare: Secondary | ICD-10-CM | POA: Diagnosis not present

## 2020-12-16 DIAGNOSIS — K5909 Other constipation: Secondary | ICD-10-CM | POA: Diagnosis not present

## 2020-12-16 DIAGNOSIS — R109 Unspecified abdominal pain: Secondary | ICD-10-CM | POA: Diagnosis not present

## 2020-12-16 DIAGNOSIS — K5901 Slow transit constipation: Secondary | ICD-10-CM | POA: Diagnosis not present

## 2020-12-16 DIAGNOSIS — E1010 Type 1 diabetes mellitus with ketoacidosis without coma: Secondary | ICD-10-CM | POA: Diagnosis not present

## 2020-12-16 DIAGNOSIS — I5022 Chronic systolic (congestive) heart failure: Secondary | ICD-10-CM | POA: Diagnosis not present

## 2020-12-16 DIAGNOSIS — D509 Iron deficiency anemia, unspecified: Secondary | ICD-10-CM | POA: Diagnosis not present

## 2020-12-16 DIAGNOSIS — E1043 Type 1 diabetes mellitus with diabetic autonomic (poly)neuropathy: Secondary | ICD-10-CM | POA: Diagnosis not present

## 2020-12-16 DIAGNOSIS — L89626 Pressure-induced deep tissue damage of left heel: Secondary | ICD-10-CM | POA: Diagnosis not present

## 2020-12-16 DIAGNOSIS — S82401D Unspecified fracture of shaft of right fibula, subsequent encounter for closed fracture with routine healing: Secondary | ICD-10-CM | POA: Diagnosis not present

## 2020-12-16 DIAGNOSIS — F329 Major depressive disorder, single episode, unspecified: Secondary | ICD-10-CM | POA: Diagnosis not present

## 2020-12-16 DIAGNOSIS — K5900 Constipation, unspecified: Secondary | ICD-10-CM | POA: Diagnosis not present

## 2020-12-16 DIAGNOSIS — E860 Dehydration: Secondary | ICD-10-CM | POA: Diagnosis not present

## 2020-12-16 DIAGNOSIS — R634 Abnormal weight loss: Secondary | ICD-10-CM | POA: Diagnosis not present

## 2020-12-19 DIAGNOSIS — N179 Acute kidney failure, unspecified: Secondary | ICD-10-CM | POA: Diagnosis not present

## 2020-12-19 DIAGNOSIS — E1010 Type 1 diabetes mellitus with ketoacidosis without coma: Secondary | ICD-10-CM | POA: Diagnosis not present

## 2020-12-19 DIAGNOSIS — N390 Urinary tract infection, site not specified: Secondary | ICD-10-CM | POA: Diagnosis not present

## 2020-12-19 DIAGNOSIS — E1043 Type 1 diabetes mellitus with diabetic autonomic (poly)neuropathy: Secondary | ICD-10-CM | POA: Diagnosis not present

## 2020-12-19 DIAGNOSIS — R109 Unspecified abdominal pain: Secondary | ICD-10-CM | POA: Diagnosis not present

## 2020-12-19 DIAGNOSIS — K3184 Gastroparesis: Secondary | ICD-10-CM | POA: Diagnosis not present

## 2020-12-19 DIAGNOSIS — K5900 Constipation, unspecified: Secondary | ICD-10-CM | POA: Diagnosis not present

## 2020-12-19 DIAGNOSIS — R112 Nausea with vomiting, unspecified: Secondary | ICD-10-CM | POA: Diagnosis not present

## 2020-12-20 DIAGNOSIS — F32A Depression, unspecified: Secondary | ICD-10-CM | POA: Diagnosis not present

## 2020-12-20 DIAGNOSIS — F4320 Adjustment disorder, unspecified: Secondary | ICD-10-CM | POA: Diagnosis not present

## 2020-12-20 DIAGNOSIS — F411 Generalized anxiety disorder: Secondary | ICD-10-CM | POA: Diagnosis not present

## 2020-12-20 DIAGNOSIS — L89610 Pressure ulcer of right heel, unstageable: Secondary | ICD-10-CM | POA: Diagnosis not present

## 2020-12-22 DIAGNOSIS — F32A Depression, unspecified: Secondary | ICD-10-CM | POA: Diagnosis not present

## 2020-12-22 DIAGNOSIS — F411 Generalized anxiety disorder: Secondary | ICD-10-CM | POA: Diagnosis not present

## 2020-12-27 DIAGNOSIS — L89620 Pressure ulcer of left heel, unstageable: Secondary | ICD-10-CM | POA: Diagnosis not present

## 2020-12-27 DIAGNOSIS — F411 Generalized anxiety disorder: Secondary | ICD-10-CM | POA: Diagnosis not present

## 2020-12-27 DIAGNOSIS — F32A Depression, unspecified: Secondary | ICD-10-CM | POA: Diagnosis not present

## 2020-12-27 DIAGNOSIS — L89610 Pressure ulcer of right heel, unstageable: Secondary | ICD-10-CM | POA: Diagnosis not present

## 2020-12-28 DIAGNOSIS — E109 Type 1 diabetes mellitus without complications: Secondary | ICD-10-CM | POA: Diagnosis not present

## 2020-12-28 DIAGNOSIS — G629 Polyneuropathy, unspecified: Secondary | ICD-10-CM | POA: Diagnosis not present

## 2020-12-28 DIAGNOSIS — E162 Hypoglycemia, unspecified: Secondary | ICD-10-CM | POA: Diagnosis not present

## 2021-01-03 DIAGNOSIS — F4320 Adjustment disorder, unspecified: Secondary | ICD-10-CM | POA: Diagnosis not present

## 2021-01-03 DIAGNOSIS — R634 Abnormal weight loss: Secondary | ICD-10-CM | POA: Diagnosis not present

## 2021-01-03 DIAGNOSIS — L89610 Pressure ulcer of right heel, unstageable: Secondary | ICD-10-CM | POA: Diagnosis not present

## 2021-01-03 DIAGNOSIS — F32A Depression, unspecified: Secondary | ICD-10-CM | POA: Diagnosis not present

## 2021-01-03 DIAGNOSIS — F411 Generalized anxiety disorder: Secondary | ICD-10-CM | POA: Diagnosis not present

## 2021-01-04 DIAGNOSIS — K3184 Gastroparesis: Secondary | ICD-10-CM | POA: Diagnosis not present

## 2021-01-04 DIAGNOSIS — G629 Polyneuropathy, unspecified: Secondary | ICD-10-CM | POA: Diagnosis not present

## 2021-01-04 DIAGNOSIS — R112 Nausea with vomiting, unspecified: Secondary | ICD-10-CM | POA: Diagnosis not present

## 2021-01-09 DIAGNOSIS — E785 Hyperlipidemia, unspecified: Secondary | ICD-10-CM | POA: Diagnosis not present

## 2021-01-09 DIAGNOSIS — K219 Gastro-esophageal reflux disease without esophagitis: Secondary | ICD-10-CM | POA: Diagnosis not present

## 2021-01-09 DIAGNOSIS — E538 Deficiency of other specified B group vitamins: Secondary | ICD-10-CM | POA: Diagnosis not present

## 2021-01-10 DIAGNOSIS — L89620 Pressure ulcer of left heel, unstageable: Secondary | ICD-10-CM | POA: Diagnosis not present

## 2021-01-10 DIAGNOSIS — N390 Urinary tract infection, site not specified: Secondary | ICD-10-CM | POA: Diagnosis not present

## 2021-01-11 DIAGNOSIS — R300 Dysuria: Secondary | ICD-10-CM | POA: Diagnosis not present

## 2021-01-17 DIAGNOSIS — L89620 Pressure ulcer of left heel, unstageable: Secondary | ICD-10-CM | POA: Diagnosis not present

## 2021-01-17 DIAGNOSIS — L89610 Pressure ulcer of right heel, unstageable: Secondary | ICD-10-CM | POA: Diagnosis not present

## 2021-01-18 DIAGNOSIS — R112 Nausea with vomiting, unspecified: Secondary | ICD-10-CM | POA: Diagnosis not present

## 2021-01-18 DIAGNOSIS — K219 Gastro-esophageal reflux disease without esophagitis: Secondary | ICD-10-CM | POA: Diagnosis not present

## 2021-01-18 DIAGNOSIS — E785 Hyperlipidemia, unspecified: Secondary | ICD-10-CM | POA: Diagnosis not present

## 2021-01-18 DIAGNOSIS — G629 Polyneuropathy, unspecified: Secondary | ICD-10-CM | POA: Diagnosis not present

## 2021-01-18 DIAGNOSIS — F329 Major depressive disorder, single episode, unspecified: Secondary | ICD-10-CM | POA: Diagnosis not present

## 2021-01-18 DIAGNOSIS — I10 Essential (primary) hypertension: Secondary | ICD-10-CM | POA: Diagnosis not present

## 2021-01-18 DIAGNOSIS — E109 Type 1 diabetes mellitus without complications: Secondary | ICD-10-CM | POA: Diagnosis not present

## 2021-01-18 DIAGNOSIS — K3184 Gastroparesis: Secondary | ICD-10-CM | POA: Diagnosis not present

## 2021-01-20 ENCOUNTER — Other Ambulatory Visit: Payer: Self-pay

## 2021-01-20 NOTE — Patient Outreach (Signed)
[facility]) Care Management  01/20/2021  Khamaria Henes 1965/09/26 [id number]   Referral Date: 01/20/21 Referral Source: [employer] Report Date of Discharge: 01/19/21 Facility:  [place]: [facility]   Referral received.  Telephone call to [facility]([phone]). Patient remains there in non-skilled bed.    Plan: RN CM will close case.    [name], RN, MSN [facility] Care Management Care Management Coordinator Direct Line [phone] Toll Free: [phone]  Fax: [phone]

## 2021-01-24 DIAGNOSIS — R634 Abnormal weight loss: Secondary | ICD-10-CM | POA: Diagnosis not present

## 2021-01-24 DIAGNOSIS — F411 Generalized anxiety disorder: Secondary | ICD-10-CM | POA: Diagnosis not present

## 2021-01-24 DIAGNOSIS — L89610 Pressure ulcer of right heel, unstageable: Secondary | ICD-10-CM | POA: Diagnosis not present

## 2021-01-24 DIAGNOSIS — F4320 Adjustment disorder, unspecified: Secondary | ICD-10-CM | POA: Diagnosis not present

## 2021-01-24 DIAGNOSIS — F32A Depression, unspecified: Secondary | ICD-10-CM | POA: Diagnosis not present

## 2021-01-27 DIAGNOSIS — E162 Hypoglycemia, unspecified: Secondary | ICD-10-CM | POA: Diagnosis not present

## 2021-01-31 DIAGNOSIS — L89610 Pressure ulcer of right heel, unstageable: Secondary | ICD-10-CM | POA: Diagnosis not present

## 2021-01-31 DIAGNOSIS — R197 Diarrhea, unspecified: Secondary | ICD-10-CM | POA: Diagnosis not present

## 2021-01-31 DIAGNOSIS — K3184 Gastroparesis: Secondary | ICD-10-CM | POA: Diagnosis not present

## 2021-02-07 DIAGNOSIS — T148XXD Other injury of unspecified body region, subsequent encounter: Secondary | ICD-10-CM | POA: Diagnosis not present

## 2021-02-07 DIAGNOSIS — L89610 Pressure ulcer of right heel, unstageable: Secondary | ICD-10-CM | POA: Diagnosis not present

## 2021-02-07 DIAGNOSIS — K5900 Constipation, unspecified: Secondary | ICD-10-CM | POA: Diagnosis not present

## 2021-02-10 DIAGNOSIS — E785 Hyperlipidemia, unspecified: Secondary | ICD-10-CM | POA: Diagnosis not present

## 2021-02-10 DIAGNOSIS — D509 Iron deficiency anemia, unspecified: Secondary | ICD-10-CM | POA: Diagnosis not present

## 2021-02-10 DIAGNOSIS — K219 Gastro-esophageal reflux disease without esophagitis: Secondary | ICD-10-CM | POA: Diagnosis not present

## 2021-02-10 DIAGNOSIS — I5022 Chronic systolic (congestive) heart failure: Secondary | ICD-10-CM | POA: Diagnosis not present

## 2021-02-10 DIAGNOSIS — G629 Polyneuropathy, unspecified: Secondary | ICD-10-CM | POA: Diagnosis not present

## 2021-02-10 DIAGNOSIS — F329 Major depressive disorder, single episode, unspecified: Secondary | ICD-10-CM | POA: Diagnosis not present

## 2021-02-10 DIAGNOSIS — K3184 Gastroparesis: Secondary | ICD-10-CM | POA: Diagnosis not present

## 2021-02-10 DIAGNOSIS — E109 Type 1 diabetes mellitus without complications: Secondary | ICD-10-CM | POA: Diagnosis not present

## 2021-02-13 ENCOUNTER — Telehealth: Payer: Self-pay | Admitting: Family Medicine

## 2021-02-13 DIAGNOSIS — R112 Nausea with vomiting, unspecified: Secondary | ICD-10-CM | POA: Diagnosis not present

## 2021-02-13 DIAGNOSIS — N183 Chronic kidney disease, stage 3 unspecified: Secondary | ICD-10-CM | POA: Diagnosis not present

## 2021-02-13 DIAGNOSIS — S82401D Unspecified fracture of shaft of right fibula, subsequent encounter for closed fracture with routine healing: Secondary | ICD-10-CM | POA: Diagnosis not present

## 2021-02-13 DIAGNOSIS — K3184 Gastroparesis: Secondary | ICD-10-CM | POA: Diagnosis not present

## 2021-02-13 DIAGNOSIS — E1043 Type 1 diabetes mellitus with diabetic autonomic (poly)neuropathy: Secondary | ICD-10-CM | POA: Diagnosis not present

## 2021-02-13 DIAGNOSIS — K219 Gastro-esophageal reflux disease without esophagitis: Secondary | ICD-10-CM | POA: Diagnosis not present

## 2021-02-13 DIAGNOSIS — E1022 Type 1 diabetes mellitus with diabetic chronic kidney disease: Secondary | ICD-10-CM | POA: Diagnosis not present

## 2021-02-13 DIAGNOSIS — I10 Essential (primary) hypertension: Secondary | ICD-10-CM | POA: Diagnosis not present

## 2021-02-13 DIAGNOSIS — E785 Hyperlipidemia, unspecified: Secondary | ICD-10-CM | POA: Diagnosis not present

## 2021-02-13 DIAGNOSIS — E1042 Type 1 diabetes mellitus with diabetic polyneuropathy: Secondary | ICD-10-CM | POA: Diagnosis not present

## 2021-02-13 DIAGNOSIS — F418 Other specified anxiety disorders: Secondary | ICD-10-CM | POA: Diagnosis not present

## 2021-02-13 DIAGNOSIS — D72829 Elevated white blood cell count, unspecified: Secondary | ICD-10-CM | POA: Diagnosis not present

## 2021-02-13 DIAGNOSIS — F419 Anxiety disorder, unspecified: Secondary | ICD-10-CM | POA: Diagnosis not present

## 2021-02-13 DIAGNOSIS — R0689 Other abnormalities of breathing: Secondary | ICD-10-CM | POA: Diagnosis not present

## 2021-02-13 DIAGNOSIS — R739 Hyperglycemia, unspecified: Secondary | ICD-10-CM | POA: Diagnosis not present

## 2021-02-13 DIAGNOSIS — I959 Hypotension, unspecified: Secondary | ICD-10-CM | POA: Diagnosis not present

## 2021-02-13 DIAGNOSIS — I129 Hypertensive chronic kidney disease with stage 1 through stage 4 chronic kidney disease, or unspecified chronic kidney disease: Secondary | ICD-10-CM | POA: Diagnosis not present

## 2021-02-13 DIAGNOSIS — Z794 Long term (current) use of insulin: Secondary | ICD-10-CM | POA: Diagnosis not present

## 2021-02-13 DIAGNOSIS — E1010 Type 1 diabetes mellitus with ketoacidosis without coma: Secondary | ICD-10-CM | POA: Diagnosis not present

## 2021-02-13 DIAGNOSIS — E861 Hypovolemia: Secondary | ICD-10-CM | POA: Diagnosis not present

## 2021-02-13 NOTE — Telephone Encounter (Signed)
Copied from [place] [phone] [phone]. Topic: Quick Communication - Home Health Verbal Orders >> Feb 13, 2021  8:29 AM Jodie Echevaria wrote: Caller/Agency: Tanya // [place] Number: [phone]  Requesting OT/PT/Skilled Nursing/Social Work/Speech Therapy: PT, OT and Skilled nursing  Frequency: Needing to know if Dr Margarita Rana is willing to sign orders for these things please call

## 2021-02-14 ENCOUNTER — Other Ambulatory Visit: Payer: Self-pay

## 2021-02-14 DIAGNOSIS — I2510 Atherosclerotic heart disease of native coronary artery without angina pectoris: Secondary | ICD-10-CM | POA: Diagnosis not present

## 2021-02-14 DIAGNOSIS — N183 Chronic kidney disease, stage 3 unspecified: Secondary | ICD-10-CM | POA: Diagnosis not present

## 2021-02-14 DIAGNOSIS — D649 Anemia, unspecified: Secondary | ICD-10-CM | POA: Diagnosis not present

## 2021-02-14 DIAGNOSIS — E1010 Type 1 diabetes mellitus with ketoacidosis without coma: Secondary | ICD-10-CM | POA: Diagnosis not present

## 2021-02-14 DIAGNOSIS — E1042 Type 1 diabetes mellitus with diabetic polyneuropathy: Secondary | ICD-10-CM | POA: Diagnosis not present

## 2021-02-14 DIAGNOSIS — I10 Essential (primary) hypertension: Secondary | ICD-10-CM | POA: Diagnosis not present

## 2021-02-14 DIAGNOSIS — F418 Other specified anxiety disorders: Secondary | ICD-10-CM | POA: Diagnosis not present

## 2021-02-14 DIAGNOSIS — D72829 Elevated white blood cell count, unspecified: Secondary | ICD-10-CM | POA: Diagnosis not present

## 2021-02-14 DIAGNOSIS — I129 Hypertensive chronic kidney disease with stage 1 through stage 4 chronic kidney disease, or unspecified chronic kidney disease: Secondary | ICD-10-CM | POA: Diagnosis not present

## 2021-02-14 DIAGNOSIS — Z794 Long term (current) use of insulin: Secondary | ICD-10-CM | POA: Diagnosis not present

## 2021-02-14 DIAGNOSIS — E785 Hyperlipidemia, unspecified: Secondary | ICD-10-CM | POA: Diagnosis not present

## 2021-02-14 DIAGNOSIS — Z951 Presence of aortocoronary bypass graft: Secondary | ICD-10-CM | POA: Diagnosis not present

## 2021-02-14 DIAGNOSIS — R531 Weakness: Secondary | ICD-10-CM | POA: Diagnosis not present

## 2021-02-14 DIAGNOSIS — K3184 Gastroparesis: Secondary | ICD-10-CM | POA: Diagnosis not present

## 2021-02-14 NOTE — Patient Outreach (Signed)
[facility]) Care Management  02/14/2021  [name] Jul 05, 1965 [id number]   Referral Date: 02/14/21 Referral Source: [employer] Report Date of Discharge: 02/12/21 Facility:  [place]: [facility]   Referral received.  No outreach warranted at this time.  Patient admitted to hospital.  Plan: RN CM will close case.    Yvette Baseman, RN, MSN [facility] Care Management Care Management Coordinator Direct Line [phone] Toll Free: [phone]  Fax: [phone]

## 2021-02-14 NOTE — Telephone Encounter (Signed)
[name] was called and given verbal orders for patient and also informed that [place] will sign off on all paperwork.

## 2021-02-15 DIAGNOSIS — E1042 Type 1 diabetes mellitus with diabetic polyneuropathy: Secondary | ICD-10-CM | POA: Diagnosis not present

## 2021-02-15 DIAGNOSIS — Z794 Long term (current) use of insulin: Secondary | ICD-10-CM | POA: Diagnosis not present

## 2021-02-15 DIAGNOSIS — K3184 Gastroparesis: Secondary | ICD-10-CM | POA: Diagnosis not present

## 2021-02-15 DIAGNOSIS — F418 Other specified anxiety disorders: Secondary | ICD-10-CM | POA: Diagnosis not present

## 2021-02-15 DIAGNOSIS — I2510 Atherosclerotic heart disease of native coronary artery without angina pectoris: Secondary | ICD-10-CM | POA: Diagnosis not present

## 2021-02-15 DIAGNOSIS — I10 Essential (primary) hypertension: Secondary | ICD-10-CM | POA: Diagnosis not present

## 2021-02-15 DIAGNOSIS — Z951 Presence of aortocoronary bypass graft: Secondary | ICD-10-CM | POA: Diagnosis not present

## 2021-02-15 DIAGNOSIS — E785 Hyperlipidemia, unspecified: Secondary | ICD-10-CM | POA: Diagnosis not present

## 2021-02-15 DIAGNOSIS — E1010 Type 1 diabetes mellitus with ketoacidosis without coma: Secondary | ICD-10-CM | POA: Diagnosis not present

## 2021-02-16 DIAGNOSIS — F418 Other specified anxiety disorders: Secondary | ICD-10-CM | POA: Diagnosis not present

## 2021-02-16 DIAGNOSIS — E785 Hyperlipidemia, unspecified: Secondary | ICD-10-CM | POA: Diagnosis not present

## 2021-02-16 DIAGNOSIS — Z951 Presence of aortocoronary bypass graft: Secondary | ICD-10-CM | POA: Diagnosis not present

## 2021-02-16 DIAGNOSIS — Z794 Long term (current) use of insulin: Secondary | ICD-10-CM | POA: Diagnosis not present

## 2021-02-16 DIAGNOSIS — I10 Essential (primary) hypertension: Secondary | ICD-10-CM | POA: Diagnosis not present

## 2021-02-16 DIAGNOSIS — E1042 Type 1 diabetes mellitus with diabetic polyneuropathy: Secondary | ICD-10-CM | POA: Diagnosis not present

## 2021-02-16 DIAGNOSIS — E109 Type 1 diabetes mellitus without complications: Secondary | ICD-10-CM | POA: Diagnosis not present

## 2021-02-16 DIAGNOSIS — I2510 Atherosclerotic heart disease of native coronary artery without angina pectoris: Secondary | ICD-10-CM | POA: Diagnosis not present

## 2021-02-16 DIAGNOSIS — K3184 Gastroparesis: Secondary | ICD-10-CM | POA: Diagnosis not present

## 2021-02-17 DIAGNOSIS — E669 Obesity, unspecified: Secondary | ICD-10-CM | POA: Diagnosis not present

## 2021-02-17 DIAGNOSIS — Z794 Long term (current) use of insulin: Secondary | ICD-10-CM | POA: Diagnosis not present

## 2021-02-17 DIAGNOSIS — I10 Essential (primary) hypertension: Secondary | ICD-10-CM | POA: Diagnosis not present

## 2021-02-17 DIAGNOSIS — E1042 Type 1 diabetes mellitus with diabetic polyneuropathy: Secondary | ICD-10-CM | POA: Diagnosis not present

## 2021-02-17 DIAGNOSIS — Z743 Need for continuous supervision: Secondary | ICD-10-CM | POA: Diagnosis not present

## 2021-02-17 DIAGNOSIS — I2510 Atherosclerotic heart disease of native coronary artery without angina pectoris: Secondary | ICD-10-CM | POA: Diagnosis not present

## 2021-02-17 DIAGNOSIS — K3184 Gastroparesis: Secondary | ICD-10-CM | POA: Diagnosis not present

## 2021-02-17 DIAGNOSIS — F418 Other specified anxiety disorders: Secondary | ICD-10-CM | POA: Diagnosis not present

## 2021-02-17 DIAGNOSIS — R531 Weakness: Secondary | ICD-10-CM | POA: Diagnosis not present

## 2021-02-17 DIAGNOSIS — E785 Hyperlipidemia, unspecified: Secondary | ICD-10-CM | POA: Diagnosis not present

## 2021-02-17 DIAGNOSIS — E109 Type 1 diabetes mellitus without complications: Secondary | ICD-10-CM | POA: Diagnosis not present

## 2021-02-17 DIAGNOSIS — Z951 Presence of aortocoronary bypass graft: Secondary | ICD-10-CM | POA: Diagnosis not present

## 2021-02-18 DIAGNOSIS — Z951 Presence of aortocoronary bypass graft: Secondary | ICD-10-CM | POA: Diagnosis not present

## 2021-02-18 DIAGNOSIS — E1022 Type 1 diabetes mellitus with diabetic chronic kidney disease: Secondary | ICD-10-CM | POA: Diagnosis not present

## 2021-02-18 DIAGNOSIS — Z7982 Long term (current) use of aspirin: Secondary | ICD-10-CM | POA: Diagnosis not present

## 2021-02-18 DIAGNOSIS — F32A Depression, unspecified: Secondary | ICD-10-CM | POA: Diagnosis not present

## 2021-02-18 DIAGNOSIS — F419 Anxiety disorder, unspecified: Secondary | ICD-10-CM | POA: Diagnosis not present

## 2021-02-18 DIAGNOSIS — E1040 Type 1 diabetes mellitus with diabetic neuropathy, unspecified: Secondary | ICD-10-CM | POA: Diagnosis not present

## 2021-02-18 DIAGNOSIS — Z9181 History of falling: Secondary | ICD-10-CM | POA: Diagnosis not present

## 2021-02-18 DIAGNOSIS — E1043 Type 1 diabetes mellitus with diabetic autonomic (poly)neuropathy: Secondary | ICD-10-CM | POA: Diagnosis not present

## 2021-02-18 DIAGNOSIS — E1010 Type 1 diabetes mellitus with ketoacidosis without coma: Secondary | ICD-10-CM | POA: Diagnosis not present

## 2021-02-18 DIAGNOSIS — K3184 Gastroparesis: Secondary | ICD-10-CM | POA: Diagnosis not present

## 2021-02-18 DIAGNOSIS — N183 Chronic kidney disease, stage 3 unspecified: Secondary | ICD-10-CM | POA: Diagnosis not present

## 2021-02-18 DIAGNOSIS — J45909 Unspecified asthma, uncomplicated: Secondary | ICD-10-CM | POA: Diagnosis not present

## 2021-02-18 DIAGNOSIS — I129 Hypertensive chronic kidney disease with stage 1 through stage 4 chronic kidney disease, or unspecified chronic kidney disease: Secondary | ICD-10-CM | POA: Diagnosis not present

## 2021-02-20 ENCOUNTER — Other Ambulatory Visit: Payer: Self-pay | Admitting: Endocrinology

## 2021-02-20 DIAGNOSIS — E1069 Type 1 diabetes mellitus with other specified complication: Secondary | ICD-10-CM

## 2021-02-22 DIAGNOSIS — K3184 Gastroparesis: Secondary | ICD-10-CM | POA: Diagnosis not present

## 2021-02-22 DIAGNOSIS — E876 Hypokalemia: Secondary | ICD-10-CM | POA: Diagnosis not present

## 2021-02-22 DIAGNOSIS — E871 Hypo-osmolality and hyponatremia: Secondary | ICD-10-CM | POA: Diagnosis not present

## 2021-02-22 DIAGNOSIS — L89623 Pressure ulcer of left heel, stage 3: Secondary | ICD-10-CM | POA: Diagnosis not present

## 2021-02-22 DIAGNOSIS — Z794 Long term (current) use of insulin: Secondary | ICD-10-CM | POA: Diagnosis not present

## 2021-02-22 DIAGNOSIS — G9341 Metabolic encephalopathy: Secondary | ICD-10-CM | POA: Diagnosis not present

## 2021-02-22 DIAGNOSIS — N1831 Chronic kidney disease, stage 3a: Secondary | ICD-10-CM | POA: Diagnosis not present

## 2021-02-22 DIAGNOSIS — R9431 Abnormal electrocardiogram [ECG] [EKG]: Secondary | ICD-10-CM | POA: Diagnosis not present

## 2021-02-22 DIAGNOSIS — E1065 Type 1 diabetes mellitus with hyperglycemia: Secondary | ICD-10-CM | POA: Diagnosis not present

## 2021-02-22 DIAGNOSIS — E10621 Type 1 diabetes mellitus with foot ulcer: Secondary | ICD-10-CM | POA: Diagnosis not present

## 2021-02-22 DIAGNOSIS — D509 Iron deficiency anemia, unspecified: Secondary | ICD-10-CM | POA: Diagnosis not present

## 2021-02-22 DIAGNOSIS — E1021 Type 1 diabetes mellitus with diabetic nephropathy: Secondary | ICD-10-CM | POA: Diagnosis not present

## 2021-02-22 DIAGNOSIS — R0689 Other abnormalities of breathing: Secondary | ICD-10-CM | POA: Diagnosis not present

## 2021-02-22 DIAGNOSIS — D62 Acute posthemorrhagic anemia: Secondary | ICD-10-CM | POA: Diagnosis not present

## 2021-02-22 DIAGNOSIS — I129 Hypertensive chronic kidney disease with stage 1 through stage 4 chronic kidney disease, or unspecified chronic kidney disease: Secondary | ICD-10-CM | POA: Diagnosis not present

## 2021-02-22 DIAGNOSIS — R000 Tachycardia, unspecified: Secondary | ICD-10-CM | POA: Diagnosis not present

## 2021-02-22 DIAGNOSIS — Z9114 Patient's other noncompliance with medication regimen: Secondary | ICD-10-CM | POA: Diagnosis not present

## 2021-02-22 DIAGNOSIS — L89610 Pressure ulcer of right heel, unstageable: Secondary | ICD-10-CM | POA: Diagnosis not present

## 2021-02-22 DIAGNOSIS — L89601 Pressure ulcer of unspecified heel, stage 1: Secondary | ICD-10-CM | POA: Diagnosis not present

## 2021-02-22 DIAGNOSIS — I10 Essential (primary) hypertension: Secondary | ICD-10-CM | POA: Diagnosis not present

## 2021-02-22 DIAGNOSIS — F411 Generalized anxiety disorder: Secondary | ICD-10-CM | POA: Diagnosis not present

## 2021-02-22 DIAGNOSIS — R531 Weakness: Secondary | ICD-10-CM | POA: Diagnosis not present

## 2021-02-22 DIAGNOSIS — Z9641 Presence of insulin pump (external) (internal): Secondary | ICD-10-CM | POA: Diagnosis not present

## 2021-02-22 DIAGNOSIS — I959 Hypotension, unspecified: Secondary | ICD-10-CM | POA: Diagnosis not present

## 2021-02-22 DIAGNOSIS — N1832 Chronic kidney disease, stage 3b: Secondary | ICD-10-CM | POA: Diagnosis not present

## 2021-02-22 DIAGNOSIS — G629 Polyneuropathy, unspecified: Secondary | ICD-10-CM | POA: Diagnosis not present

## 2021-02-22 DIAGNOSIS — E1010 Type 1 diabetes mellitus with ketoacidosis without coma: Secondary | ICD-10-CM | POA: Diagnosis not present

## 2021-02-22 DIAGNOSIS — Z6830 Body mass index (BMI) 30.0-30.9, adult: Secondary | ICD-10-CM | POA: Diagnosis not present

## 2021-02-22 DIAGNOSIS — F419 Anxiety disorder, unspecified: Secondary | ICD-10-CM | POA: Diagnosis not present

## 2021-02-22 DIAGNOSIS — F32A Depression, unspecified: Secondary | ICD-10-CM | POA: Diagnosis not present

## 2021-02-22 DIAGNOSIS — E1022 Type 1 diabetes mellitus with diabetic chronic kidney disease: Secondary | ICD-10-CM | POA: Diagnosis not present

## 2021-02-22 DIAGNOSIS — N179 Acute kidney failure, unspecified: Secondary | ICD-10-CM | POA: Diagnosis not present

## 2021-02-22 DIAGNOSIS — R739 Hyperglycemia, unspecified: Secondary | ICD-10-CM | POA: Diagnosis not present

## 2021-02-22 DIAGNOSIS — R112 Nausea with vomiting, unspecified: Secondary | ICD-10-CM | POA: Diagnosis not present

## 2021-02-23 DIAGNOSIS — K3184 Gastroparesis: Secondary | ICD-10-CM | POA: Diagnosis not present

## 2021-02-23 DIAGNOSIS — I129 Hypertensive chronic kidney disease with stage 1 through stage 4 chronic kidney disease, or unspecified chronic kidney disease: Secondary | ICD-10-CM | POA: Diagnosis not present

## 2021-02-23 DIAGNOSIS — N179 Acute kidney failure, unspecified: Secondary | ICD-10-CM | POA: Diagnosis not present

## 2021-02-23 DIAGNOSIS — E1010 Type 1 diabetes mellitus with ketoacidosis without coma: Secondary | ICD-10-CM | POA: Diagnosis not present

## 2021-02-23 DIAGNOSIS — Z794 Long term (current) use of insulin: Secondary | ICD-10-CM | POA: Diagnosis not present

## 2021-02-23 DIAGNOSIS — F419 Anxiety disorder, unspecified: Secondary | ICD-10-CM | POA: Diagnosis not present

## 2021-02-23 DIAGNOSIS — Z9641 Presence of insulin pump (external) (internal): Secondary | ICD-10-CM | POA: Diagnosis not present

## 2021-02-23 DIAGNOSIS — D509 Iron deficiency anemia, unspecified: Secondary | ICD-10-CM | POA: Diagnosis not present

## 2021-02-23 DIAGNOSIS — D62 Acute posthemorrhagic anemia: Secondary | ICD-10-CM | POA: Diagnosis not present

## 2021-02-23 DIAGNOSIS — N1832 Chronic kidney disease, stage 3b: Secondary | ICD-10-CM | POA: Diagnosis not present

## 2021-02-23 DIAGNOSIS — R000 Tachycardia, unspecified: Secondary | ICD-10-CM | POA: Diagnosis not present

## 2021-02-23 DIAGNOSIS — F32A Depression, unspecified: Secondary | ICD-10-CM | POA: Diagnosis not present

## 2021-02-23 DIAGNOSIS — R9431 Abnormal electrocardiogram [ECG] [EKG]: Secondary | ICD-10-CM | POA: Diagnosis not present

## 2021-02-23 DIAGNOSIS — E1022 Type 1 diabetes mellitus with diabetic chronic kidney disease: Secondary | ICD-10-CM | POA: Diagnosis not present

## 2021-02-23 DIAGNOSIS — E876 Hypokalemia: Secondary | ICD-10-CM | POA: Diagnosis not present

## 2021-02-24 DIAGNOSIS — E1021 Type 1 diabetes mellitus with diabetic nephropathy: Secondary | ICD-10-CM | POA: Diagnosis not present

## 2021-02-24 DIAGNOSIS — K3184 Gastroparesis: Secondary | ICD-10-CM | POA: Diagnosis not present

## 2021-02-24 DIAGNOSIS — N1831 Chronic kidney disease, stage 3a: Secondary | ICD-10-CM | POA: Diagnosis not present

## 2021-02-24 DIAGNOSIS — N179 Acute kidney failure, unspecified: Secondary | ICD-10-CM | POA: Diagnosis not present

## 2021-02-24 DIAGNOSIS — F411 Generalized anxiety disorder: Secondary | ICD-10-CM | POA: Diagnosis not present

## 2021-02-24 DIAGNOSIS — E1022 Type 1 diabetes mellitus with diabetic chronic kidney disease: Secondary | ICD-10-CM | POA: Diagnosis not present

## 2021-02-24 DIAGNOSIS — E1065 Type 1 diabetes mellitus with hyperglycemia: Secondary | ICD-10-CM | POA: Diagnosis not present

## 2021-02-24 DIAGNOSIS — E10621 Type 1 diabetes mellitus with foot ulcer: Secondary | ICD-10-CM | POA: Diagnosis not present

## 2021-02-24 DIAGNOSIS — I129 Hypertensive chronic kidney disease with stage 1 through stage 4 chronic kidney disease, or unspecified chronic kidney disease: Secondary | ICD-10-CM | POA: Diagnosis not present

## 2021-02-24 DIAGNOSIS — Z794 Long term (current) use of insulin: Secondary | ICD-10-CM | POA: Diagnosis not present

## 2021-02-24 DIAGNOSIS — N1832 Chronic kidney disease, stage 3b: Secondary | ICD-10-CM | POA: Diagnosis not present

## 2021-02-24 DIAGNOSIS — E876 Hypokalemia: Secondary | ICD-10-CM | POA: Diagnosis not present

## 2021-02-24 DIAGNOSIS — E1010 Type 1 diabetes mellitus with ketoacidosis without coma: Secondary | ICD-10-CM | POA: Diagnosis not present

## 2021-02-24 DIAGNOSIS — Z9641 Presence of insulin pump (external) (internal): Secondary | ICD-10-CM | POA: Diagnosis not present

## 2021-02-24 DIAGNOSIS — D62 Acute posthemorrhagic anemia: Secondary | ICD-10-CM | POA: Diagnosis not present

## 2021-02-24 DIAGNOSIS — L89610 Pressure ulcer of right heel, unstageable: Secondary | ICD-10-CM | POA: Diagnosis not present

## 2021-02-24 DIAGNOSIS — L89623 Pressure ulcer of left heel, stage 3: Secondary | ICD-10-CM | POA: Diagnosis not present

## 2021-02-24 DIAGNOSIS — D509 Iron deficiency anemia, unspecified: Secondary | ICD-10-CM | POA: Diagnosis not present

## 2021-02-24 DIAGNOSIS — F32A Depression, unspecified: Secondary | ICD-10-CM | POA: Diagnosis not present

## 2021-02-24 DIAGNOSIS — G629 Polyneuropathy, unspecified: Secondary | ICD-10-CM | POA: Diagnosis not present

## 2021-02-25 DIAGNOSIS — N1832 Chronic kidney disease, stage 3b: Secondary | ICD-10-CM | POA: Diagnosis not present

## 2021-02-25 DIAGNOSIS — E1065 Type 1 diabetes mellitus with hyperglycemia: Secondary | ICD-10-CM | POA: Diagnosis not present

## 2021-02-25 DIAGNOSIS — N179 Acute kidney failure, unspecified: Secondary | ICD-10-CM | POA: Diagnosis not present

## 2021-02-25 DIAGNOSIS — E1021 Type 1 diabetes mellitus with diabetic nephropathy: Secondary | ICD-10-CM | POA: Diagnosis not present

## 2021-02-25 DIAGNOSIS — E1010 Type 1 diabetes mellitus with ketoacidosis without coma: Secondary | ICD-10-CM | POA: Diagnosis not present

## 2021-02-25 DIAGNOSIS — E10621 Type 1 diabetes mellitus with foot ulcer: Secondary | ICD-10-CM | POA: Diagnosis not present

## 2021-02-25 DIAGNOSIS — Z794 Long term (current) use of insulin: Secondary | ICD-10-CM | POA: Diagnosis not present

## 2021-02-25 DIAGNOSIS — E1022 Type 1 diabetes mellitus with diabetic chronic kidney disease: Secondary | ICD-10-CM | POA: Diagnosis not present

## 2021-02-25 DIAGNOSIS — I129 Hypertensive chronic kidney disease with stage 1 through stage 4 chronic kidney disease, or unspecified chronic kidney disease: Secondary | ICD-10-CM | POA: Diagnosis not present

## 2021-02-26 ENCOUNTER — Telehealth: Payer: Self-pay

## 2021-02-26 DIAGNOSIS — E10621 Type 1 diabetes mellitus with foot ulcer: Secondary | ICD-10-CM | POA: Diagnosis not present

## 2021-02-26 DIAGNOSIS — Z794 Long term (current) use of insulin: Secondary | ICD-10-CM | POA: Diagnosis not present

## 2021-02-26 DIAGNOSIS — L89623 Pressure ulcer of left heel, stage 3: Secondary | ICD-10-CM | POA: Diagnosis not present

## 2021-02-26 DIAGNOSIS — E1065 Type 1 diabetes mellitus with hyperglycemia: Secondary | ICD-10-CM | POA: Diagnosis not present

## 2021-02-26 DIAGNOSIS — E1021 Type 1 diabetes mellitus with diabetic nephropathy: Secondary | ICD-10-CM | POA: Diagnosis not present

## 2021-02-26 DIAGNOSIS — E1022 Type 1 diabetes mellitus with diabetic chronic kidney disease: Secondary | ICD-10-CM | POA: Diagnosis not present

## 2021-02-26 DIAGNOSIS — I129 Hypertensive chronic kidney disease with stage 1 through stage 4 chronic kidney disease, or unspecified chronic kidney disease: Secondary | ICD-10-CM | POA: Diagnosis not present

## 2021-02-26 DIAGNOSIS — N1832 Chronic kidney disease, stage 3b: Secondary | ICD-10-CM | POA: Diagnosis not present

## 2021-02-26 DIAGNOSIS — E1010 Type 1 diabetes mellitus with ketoacidosis without coma: Secondary | ICD-10-CM | POA: Diagnosis not present

## 2021-02-26 NOTE — Telephone Encounter (Signed)
Lmom for patient to ret call. 

## 2021-02-27 DIAGNOSIS — E1021 Type 1 diabetes mellitus with diabetic nephropathy: Secondary | ICD-10-CM | POA: Diagnosis not present

## 2021-02-27 DIAGNOSIS — E10621 Type 1 diabetes mellitus with foot ulcer: Secondary | ICD-10-CM | POA: Diagnosis not present

## 2021-02-27 DIAGNOSIS — L89623 Pressure ulcer of left heel, stage 3: Secondary | ICD-10-CM | POA: Diagnosis not present

## 2021-02-27 DIAGNOSIS — I129 Hypertensive chronic kidney disease with stage 1 through stage 4 chronic kidney disease, or unspecified chronic kidney disease: Secondary | ICD-10-CM | POA: Diagnosis not present

## 2021-02-27 DIAGNOSIS — E1010 Type 1 diabetes mellitus with ketoacidosis without coma: Secondary | ICD-10-CM | POA: Diagnosis not present

## 2021-02-27 DIAGNOSIS — E1065 Type 1 diabetes mellitus with hyperglycemia: Secondary | ICD-10-CM | POA: Diagnosis not present

## 2021-02-27 DIAGNOSIS — N1832 Chronic kidney disease, stage 3b: Secondary | ICD-10-CM | POA: Diagnosis not present

## 2021-02-27 DIAGNOSIS — Z794 Long term (current) use of insulin: Secondary | ICD-10-CM | POA: Diagnosis not present

## 2021-02-27 DIAGNOSIS — E1022 Type 1 diabetes mellitus with diabetic chronic kidney disease: Secondary | ICD-10-CM | POA: Diagnosis not present

## 2021-02-28 DIAGNOSIS — Z794 Long term (current) use of insulin: Secondary | ICD-10-CM | POA: Diagnosis not present

## 2021-02-28 DIAGNOSIS — I129 Hypertensive chronic kidney disease with stage 1 through stage 4 chronic kidney disease, or unspecified chronic kidney disease: Secondary | ICD-10-CM | POA: Diagnosis not present

## 2021-02-28 DIAGNOSIS — N1832 Chronic kidney disease, stage 3b: Secondary | ICD-10-CM | POA: Diagnosis not present

## 2021-02-28 DIAGNOSIS — L89623 Pressure ulcer of left heel, stage 3: Secondary | ICD-10-CM | POA: Diagnosis not present

## 2021-02-28 DIAGNOSIS — E10621 Type 1 diabetes mellitus with foot ulcer: Secondary | ICD-10-CM | POA: Diagnosis not present

## 2021-02-28 DIAGNOSIS — E1021 Type 1 diabetes mellitus with diabetic nephropathy: Secondary | ICD-10-CM | POA: Diagnosis not present

## 2021-02-28 DIAGNOSIS — L89601 Pressure ulcer of unspecified heel, stage 1: Secondary | ICD-10-CM | POA: Diagnosis not present

## 2021-02-28 DIAGNOSIS — E1065 Type 1 diabetes mellitus with hyperglycemia: Secondary | ICD-10-CM | POA: Diagnosis not present

## 2021-02-28 DIAGNOSIS — E1022 Type 1 diabetes mellitus with diabetic chronic kidney disease: Secondary | ICD-10-CM | POA: Diagnosis not present

## 2021-03-01 DIAGNOSIS — E1022 Type 1 diabetes mellitus with diabetic chronic kidney disease: Secondary | ICD-10-CM | POA: Diagnosis not present

## 2021-03-01 DIAGNOSIS — L89601 Pressure ulcer of unspecified heel, stage 1: Secondary | ICD-10-CM | POA: Diagnosis not present

## 2021-03-01 DIAGNOSIS — E1065 Type 1 diabetes mellitus with hyperglycemia: Secondary | ICD-10-CM | POA: Diagnosis not present

## 2021-03-01 DIAGNOSIS — Z794 Long term (current) use of insulin: Secondary | ICD-10-CM | POA: Diagnosis not present

## 2021-03-01 DIAGNOSIS — E10621 Type 1 diabetes mellitus with foot ulcer: Secondary | ICD-10-CM | POA: Diagnosis not present

## 2021-03-01 DIAGNOSIS — N1832 Chronic kidney disease, stage 3b: Secondary | ICD-10-CM | POA: Diagnosis not present

## 2021-03-01 DIAGNOSIS — L89623 Pressure ulcer of left heel, stage 3: Secondary | ICD-10-CM | POA: Diagnosis not present

## 2021-03-01 DIAGNOSIS — I129 Hypertensive chronic kidney disease with stage 1 through stage 4 chronic kidney disease, or unspecified chronic kidney disease: Secondary | ICD-10-CM | POA: Diagnosis not present

## 2021-03-01 DIAGNOSIS — E1021 Type 1 diabetes mellitus with diabetic nephropathy: Secondary | ICD-10-CM | POA: Diagnosis not present

## 2021-03-01 NOTE — Progress Notes (Deleted)
Patient ID: [name], female   DOB: 1965-11-26, 55 y.o.   MRN: [id number]  After hospitalization 8/31-02/26/2021  From discharge: Subjective / Interval History / ROS  [name] is a 55 y.o. F with PMHx significant for DM, gastroparesis, essential HTN, neuropathy, asthma, anxiety, who was admitted to the ICU on 02/22/2021 for DKA.  She is well-known to hospital due to multiple previous admissions. She was most recently discharged on 02/17/2021 after being treated for DKA.  Pt reports she has not been keeping up with her insulin. She reported experiencing nausea and vomiting for approximately 1 day.  She is unable to explain why she has not been keeping up with her insulin regimen at this time. Patient has progressed well in the ICU, AG closed, and she was transition from insulin drip to home dose Lantus 20 units daily and SSI overnight on 9/1. She is tolerating oral diet without further vomiting. The patient blood sugars have remained controlled over the past 24 hours   Patient seen and examined, c/o left ear discomfort   Assessment and Plan  Patient c/o left ear discomfort, left eardrum retracted with scarring on eardrum , taught patient autoinsufflation techniques, will order flonase nasal. Patient applied for medicaid today, prepare for discharge to home vs. SNF   Type 1 diabetes mellitus with ketoacidosis without coma ([place]) Type 1 diabetes mellitus with hyperglycemia ([place]) DKA resolved. Baseline poorly controlled, and has had frequent admissions for DKA. Transitioned off insulin drip overnight, resumed on home dose Lantus 20units daily and SSI.  Lab Results  Component Value Date    HBA1C 8.7 (H) 02/14/2021  - Continue Lantus 20 units daily and SSI - Hypoglycemia protocol - TID AC fingersticks - Consistent carb diet -At this time anticipate discharge to skilled care nursing facility given patient's needs. -Continue insulin with meals   Pressure injury of right heel, unstageable  (HCC) Pressure injury of left heel, stage 3 ([place]) - Wound care RN following - Follow CBC and temperature curve     Impaired gait and mobility H/o Closed fracture of right fibula and tibia Orthopedic surgeon: Dr. Linton Rump - Patient stated she missed her follow-up appointment, and requesting Dr. Linton Rump see her while in hospital. Consider reaching out to the office tomorrow. - PT/OT consulted. Patient requesting SNF. - Fall precautions -Anticipate discharge to skilled care nursing facility.     Stage 3a chronic kidney disease (HCC) Diabetic nephropathy associated with type 1 diabetes mellitus (HCC) - Continue IVF - Strict I/O's - Check serum chemistry in a.m.      CAD (coronary artery disease), native coronary artery CAD S/P CABG x 3 No acute issues, denies chest pain or dyspnea. - Continue ASA and statin therapy - Continue Cozaar and metoprolol     Gastroparesis GERD (gastroesophageal reflux disease) Nausea and vomiting resolved - Continue reglan, bentyl, and PRN zofran  - Continue home dose PPI     Neuropathy - Continue home dose gabapentin    Primary hypertension Normotensive - Continue home  dose Cozaar. Holding metoprolol, resume as clinically indicated. - V/S per protocol     Hypokalemia resolved Mild, 3.4 -Encourage oral diet    Vitamin B12 deficiency - Continue B12 daily supplement    HLD (hyperlipidemia) - Resume home dose statin therapy    GAD (generalized anxiety disorder) Depression Mood calm and cooperative - Continue lexapro and Remeron    Iron deficiency anemia - Continue home dose ferrous sulfate - Trend

## 2021-03-02 ENCOUNTER — Ambulatory Visit: Payer: Medicare HMO | Admitting: Physician Assistant

## 2021-03-02 ENCOUNTER — Telehealth (INDEPENDENT_AMBULATORY_CARE_PROVIDER_SITE_OTHER): Payer: Self-pay | Admitting: Family Medicine

## 2021-03-02 DIAGNOSIS — L89610 Pressure ulcer of right heel, unstageable: Secondary | ICD-10-CM | POA: Diagnosis not present

## 2021-03-02 DIAGNOSIS — L89623 Pressure ulcer of left heel, stage 3: Secondary | ICD-10-CM | POA: Diagnosis not present

## 2021-03-02 DIAGNOSIS — N179 Acute kidney failure, unspecified: Secondary | ICD-10-CM | POA: Diagnosis not present

## 2021-03-02 DIAGNOSIS — I129 Hypertensive chronic kidney disease with stage 1 through stage 4 chronic kidney disease, or unspecified chronic kidney disease: Secondary | ICD-10-CM | POA: Diagnosis not present

## 2021-03-02 DIAGNOSIS — E1021 Type 1 diabetes mellitus with diabetic nephropathy: Secondary | ICD-10-CM | POA: Diagnosis not present

## 2021-03-02 DIAGNOSIS — E1022 Type 1 diabetes mellitus with diabetic chronic kidney disease: Secondary | ICD-10-CM | POA: Diagnosis not present

## 2021-03-02 DIAGNOSIS — D509 Iron deficiency anemia, unspecified: Secondary | ICD-10-CM | POA: Diagnosis not present

## 2021-03-02 DIAGNOSIS — E1010 Type 1 diabetes mellitus with ketoacidosis without coma: Secondary | ICD-10-CM | POA: Diagnosis not present

## 2021-03-02 DIAGNOSIS — E1065 Type 1 diabetes mellitus with hyperglycemia: Secondary | ICD-10-CM

## 2021-03-02 DIAGNOSIS — N1831 Chronic kidney disease, stage 3a: Secondary | ICD-10-CM | POA: Diagnosis not present

## 2021-03-02 NOTE — Telephone Encounter (Signed)
FYI  Copied from [place] [phone]. Topic: Appointment Scheduling - Scheduling Inquiry for Clinic >> Mar 02, 2021  7:32 AM Lennox Solders wrote: Reason for CRM: Pt is still in hospital per sister morhonda

## 2021-03-08 ENCOUNTER — Ambulatory Visit: Payer: Medicare HMO | Admitting: Physician Assistant

## 2021-03-08 DIAGNOSIS — M21371 Foot drop, right foot: Secondary | ICD-10-CM | POA: Diagnosis not present

## 2021-03-08 DIAGNOSIS — M6701 Short Achilles tendon (acquired), right ankle: Secondary | ICD-10-CM | POA: Diagnosis not present

## 2021-03-08 DIAGNOSIS — L97411 Non-pressure chronic ulcer of right heel and midfoot limited to breakdown of skin: Secondary | ICD-10-CM | POA: Diagnosis not present

## 2021-03-09 DIAGNOSIS — G9341 Metabolic encephalopathy: Secondary | ICD-10-CM | POA: Diagnosis not present

## 2021-03-09 DIAGNOSIS — L89623 Pressure ulcer of left heel, stage 3: Secondary | ICD-10-CM | POA: Diagnosis not present

## 2021-03-09 DIAGNOSIS — F32A Depression, unspecified: Secondary | ICD-10-CM | POA: Diagnosis not present

## 2021-03-09 DIAGNOSIS — R739 Hyperglycemia, unspecified: Secondary | ICD-10-CM | POA: Diagnosis not present

## 2021-03-09 DIAGNOSIS — I129 Hypertensive chronic kidney disease with stage 1 through stage 4 chronic kidney disease, or unspecified chronic kidney disease: Secondary | ICD-10-CM | POA: Diagnosis not present

## 2021-03-09 DIAGNOSIS — K3184 Gastroparesis: Secondary | ICD-10-CM | POA: Diagnosis not present

## 2021-03-09 DIAGNOSIS — E1043 Type 1 diabetes mellitus with diabetic autonomic (poly)neuropathy: Secondary | ICD-10-CM | POA: Diagnosis not present

## 2021-03-09 DIAGNOSIS — N3000 Acute cystitis without hematuria: Secondary | ICD-10-CM | POA: Diagnosis not present

## 2021-03-09 DIAGNOSIS — N179 Acute kidney failure, unspecified: Secondary | ICD-10-CM | POA: Diagnosis not present

## 2021-03-09 DIAGNOSIS — F419 Anxiety disorder, unspecified: Secondary | ICD-10-CM | POA: Diagnosis not present

## 2021-03-09 DIAGNOSIS — Z9114 Patient's other noncompliance with medication regimen: Secondary | ICD-10-CM | POA: Diagnosis not present

## 2021-03-09 DIAGNOSIS — E1065 Type 1 diabetes mellitus with hyperglycemia: Secondary | ICD-10-CM | POA: Diagnosis not present

## 2021-03-09 DIAGNOSIS — R000 Tachycardia, unspecified: Secondary | ICD-10-CM | POA: Diagnosis not present

## 2021-03-09 DIAGNOSIS — R1111 Vomiting without nausea: Secondary | ICD-10-CM | POA: Diagnosis not present

## 2021-03-09 DIAGNOSIS — N183 Chronic kidney disease, stage 3 unspecified: Secondary | ICD-10-CM | POA: Diagnosis not present

## 2021-03-09 DIAGNOSIS — E1040 Type 1 diabetes mellitus with diabetic neuropathy, unspecified: Secondary | ICD-10-CM | POA: Diagnosis not present

## 2021-03-09 DIAGNOSIS — Z955 Presence of coronary angioplasty implant and graft: Secondary | ICD-10-CM | POA: Diagnosis not present

## 2021-03-09 DIAGNOSIS — E785 Hyperlipidemia, unspecified: Secondary | ICD-10-CM | POA: Diagnosis not present

## 2021-03-09 DIAGNOSIS — E1010 Type 1 diabetes mellitus with ketoacidosis without coma: Secondary | ICD-10-CM | POA: Diagnosis not present

## 2021-03-09 DIAGNOSIS — F411 Generalized anxiety disorder: Secondary | ICD-10-CM | POA: Diagnosis not present

## 2021-03-09 DIAGNOSIS — E1022 Type 1 diabetes mellitus with diabetic chronic kidney disease: Secondary | ICD-10-CM | POA: Diagnosis not present

## 2021-03-09 DIAGNOSIS — E1110 Type 2 diabetes mellitus with ketoacidosis without coma: Secondary | ICD-10-CM | POA: Diagnosis not present

## 2021-03-09 DIAGNOSIS — I959 Hypotension, unspecified: Secondary | ICD-10-CM | POA: Diagnosis not present

## 2021-03-09 DIAGNOSIS — G629 Polyneuropathy, unspecified: Secondary | ICD-10-CM | POA: Diagnosis not present

## 2021-03-09 LAB — HEMOGLOBIN A1C: Hemoglobin A1C: 8.2

## 2021-03-10 DIAGNOSIS — G9341 Metabolic encephalopathy: Secondary | ICD-10-CM | POA: Diagnosis not present

## 2021-03-10 DIAGNOSIS — E1040 Type 1 diabetes mellitus with diabetic neuropathy, unspecified: Secondary | ICD-10-CM | POA: Diagnosis not present

## 2021-03-10 DIAGNOSIS — E1010 Type 1 diabetes mellitus with ketoacidosis without coma: Secondary | ICD-10-CM | POA: Diagnosis not present

## 2021-03-10 DIAGNOSIS — I129 Hypertensive chronic kidney disease with stage 1 through stage 4 chronic kidney disease, or unspecified chronic kidney disease: Secondary | ICD-10-CM | POA: Diagnosis not present

## 2021-03-10 DIAGNOSIS — Z955 Presence of coronary angioplasty implant and graft: Secondary | ICD-10-CM | POA: Diagnosis not present

## 2021-03-10 DIAGNOSIS — N179 Acute kidney failure, unspecified: Secondary | ICD-10-CM | POA: Diagnosis not present

## 2021-03-10 DIAGNOSIS — K3184 Gastroparesis: Secondary | ICD-10-CM | POA: Diagnosis not present

## 2021-03-10 DIAGNOSIS — N3000 Acute cystitis without hematuria: Secondary | ICD-10-CM | POA: Diagnosis not present

## 2021-03-10 DIAGNOSIS — F419 Anxiety disorder, unspecified: Secondary | ICD-10-CM | POA: Diagnosis not present

## 2021-03-11 DIAGNOSIS — E1010 Type 1 diabetes mellitus with ketoacidosis without coma: Secondary | ICD-10-CM | POA: Diagnosis not present

## 2021-03-12 DIAGNOSIS — R000 Tachycardia, unspecified: Secondary | ICD-10-CM | POA: Diagnosis not present

## 2021-03-12 DIAGNOSIS — E1010 Type 1 diabetes mellitus with ketoacidosis without coma: Secondary | ICD-10-CM | POA: Diagnosis not present

## 2021-03-13 DIAGNOSIS — E1010 Type 1 diabetes mellitus with ketoacidosis without coma: Secondary | ICD-10-CM | POA: Diagnosis not present

## 2021-03-14 DIAGNOSIS — N3000 Acute cystitis without hematuria: Secondary | ICD-10-CM | POA: Diagnosis not present

## 2021-03-14 DIAGNOSIS — E1110 Type 2 diabetes mellitus with ketoacidosis without coma: Secondary | ICD-10-CM | POA: Diagnosis not present

## 2021-03-14 DIAGNOSIS — K3184 Gastroparesis: Secondary | ICD-10-CM | POA: Diagnosis not present

## 2021-03-15 DIAGNOSIS — M6701 Short Achilles tendon (acquired), right ankle: Secondary | ICD-10-CM | POA: Diagnosis not present

## 2021-03-15 DIAGNOSIS — K3184 Gastroparesis: Secondary | ICD-10-CM | POA: Diagnosis not present

## 2021-03-15 DIAGNOSIS — E1065 Type 1 diabetes mellitus with hyperglycemia: Secondary | ICD-10-CM | POA: Diagnosis not present

## 2021-03-15 DIAGNOSIS — E878 Other disorders of electrolyte and fluid balance, not elsewhere classified: Secondary | ICD-10-CM | POA: Diagnosis not present

## 2021-03-15 DIAGNOSIS — E1043 Type 1 diabetes mellitus with diabetic autonomic (poly)neuropathy: Secondary | ICD-10-CM | POA: Diagnosis not present

## 2021-03-15 DIAGNOSIS — K5901 Slow transit constipation: Secondary | ICD-10-CM | POA: Diagnosis not present

## 2021-03-15 DIAGNOSIS — L89612 Pressure ulcer of right heel, stage 2: Secondary | ICD-10-CM | POA: Diagnosis not present

## 2021-03-15 DIAGNOSIS — L89622 Pressure ulcer of left heel, stage 2: Secondary | ICD-10-CM | POA: Diagnosis not present

## 2021-03-15 DIAGNOSIS — S82141D Displaced bicondylar fracture of right tibia, subsequent encounter for closed fracture with routine healing: Secondary | ICD-10-CM | POA: Diagnosis not present

## 2021-03-17 ENCOUNTER — Telehealth: Payer: Self-pay | Admitting: Family Medicine

## 2021-03-17 DIAGNOSIS — E1065 Type 1 diabetes mellitus with hyperglycemia: Secondary | ICD-10-CM | POA: Diagnosis not present

## 2021-03-17 DIAGNOSIS — M6701 Short Achilles tendon (acquired), right ankle: Secondary | ICD-10-CM | POA: Diagnosis not present

## 2021-03-17 DIAGNOSIS — E878 Other disorders of electrolyte and fluid balance, not elsewhere classified: Secondary | ICD-10-CM | POA: Diagnosis not present

## 2021-03-17 DIAGNOSIS — K3184 Gastroparesis: Secondary | ICD-10-CM | POA: Diagnosis not present

## 2021-03-17 DIAGNOSIS — L89612 Pressure ulcer of right heel, stage 2: Secondary | ICD-10-CM | POA: Diagnosis not present

## 2021-03-17 DIAGNOSIS — S82141D Displaced bicondylar fracture of right tibia, subsequent encounter for closed fracture with routine healing: Secondary | ICD-10-CM | POA: Diagnosis not present

## 2021-03-17 DIAGNOSIS — L89622 Pressure ulcer of left heel, stage 2: Secondary | ICD-10-CM | POA: Diagnosis not present

## 2021-03-17 DIAGNOSIS — E1043 Type 1 diabetes mellitus with diabetic autonomic (poly)neuropathy: Secondary | ICD-10-CM | POA: Diagnosis not present

## 2021-03-17 DIAGNOSIS — K5901 Slow transit constipation: Secondary | ICD-10-CM | POA: Diagnosis not present

## 2021-03-17 NOTE — Telephone Encounter (Signed)
Anderson Malta calling from [facility] is calling for verbal orders for PT Frequency 1 time a week for 9 weeks starting today. Cb- [phone] Ok to verbal on Vm

## 2021-03-20 ENCOUNTER — Telehealth: Payer: Self-pay | Admitting: Endocrinology

## 2021-03-20 DIAGNOSIS — L89612 Pressure ulcer of right heel, stage 2: Secondary | ICD-10-CM | POA: Diagnosis not present

## 2021-03-20 DIAGNOSIS — E1043 Type 1 diabetes mellitus with diabetic autonomic (poly)neuropathy: Secondary | ICD-10-CM | POA: Diagnosis not present

## 2021-03-20 DIAGNOSIS — S82141D Displaced bicondylar fracture of right tibia, subsequent encounter for closed fracture with routine healing: Secondary | ICD-10-CM | POA: Diagnosis not present

## 2021-03-20 DIAGNOSIS — M6701 Short Achilles tendon (acquired), right ankle: Secondary | ICD-10-CM | POA: Diagnosis not present

## 2021-03-20 DIAGNOSIS — E1065 Type 1 diabetes mellitus with hyperglycemia: Secondary | ICD-10-CM

## 2021-03-20 DIAGNOSIS — L89622 Pressure ulcer of left heel, stage 2: Secondary | ICD-10-CM | POA: Diagnosis not present

## 2021-03-20 DIAGNOSIS — K5901 Slow transit constipation: Secondary | ICD-10-CM | POA: Diagnosis not present

## 2021-03-20 DIAGNOSIS — K3184 Gastroparesis: Secondary | ICD-10-CM | POA: Diagnosis not present

## 2021-03-20 DIAGNOSIS — E878 Other disorders of electrolyte and fluid balance, not elsewhere classified: Secondary | ICD-10-CM | POA: Diagnosis not present

## 2021-03-20 MED ORDER — "INSULIN SYRINGE-NEEDLE U-100 31G X 5/16"" 0.5 ML MISC": 5 refills | Status: DC

## 2021-03-20 NOTE — Telephone Encounter (Signed)
Patient requests new Rx for Syringes for Humalog. Patient's sister states (Patient has been in rehab for 4 - 5 months) be sent to:  [facility] [place], [place] Phone:  [phone]  Fax:  [phone]    Patient usually purchases syringes over the counter, however, PHARM now stated that Patient needs RX

## 2021-03-20 NOTE — Telephone Encounter (Signed)
Returned call to give verbals no vm. Phone just kept ringing

## 2021-03-20 NOTE — Telephone Encounter (Signed)
Rx sent to pharmacy   

## 2021-03-21 ENCOUNTER — Telehealth: Payer: Self-pay | Admitting: Family Medicine

## 2021-03-21 DIAGNOSIS — K5901 Slow transit constipation: Secondary | ICD-10-CM | POA: Diagnosis not present

## 2021-03-21 DIAGNOSIS — L89612 Pressure ulcer of right heel, stage 2: Secondary | ICD-10-CM | POA: Diagnosis not present

## 2021-03-21 DIAGNOSIS — L89622 Pressure ulcer of left heel, stage 2: Secondary | ICD-10-CM | POA: Diagnosis not present

## 2021-03-21 DIAGNOSIS — E878 Other disorders of electrolyte and fluid balance, not elsewhere classified: Secondary | ICD-10-CM | POA: Diagnosis not present

## 2021-03-21 DIAGNOSIS — E1043 Type 1 diabetes mellitus with diabetic autonomic (poly)neuropathy: Secondary | ICD-10-CM | POA: Diagnosis not present

## 2021-03-21 DIAGNOSIS — M6701 Short Achilles tendon (acquired), right ankle: Secondary | ICD-10-CM | POA: Diagnosis not present

## 2021-03-21 DIAGNOSIS — S82141D Displaced bicondylar fracture of right tibia, subsequent encounter for closed fracture with routine healing: Secondary | ICD-10-CM | POA: Diagnosis not present

## 2021-03-21 DIAGNOSIS — E1065 Type 1 diabetes mellitus with hyperglycemia: Secondary | ICD-10-CM | POA: Diagnosis not present

## 2021-03-21 DIAGNOSIS — K3184 Gastroparesis: Secondary | ICD-10-CM | POA: Diagnosis not present

## 2021-03-21 NOTE — Telephone Encounter (Signed)
Anderson Malta calling back and gave message OK for PT 1 wk 9.  Anderson Malta will follow up with fax for signature.  [phone]

## 2021-03-21 NOTE — Telephone Encounter (Signed)
Copied from [place] [phone]. Topic: Quick Communication - Home Health Verbal Orders >> Mar 21, 2021 12:08 PM [place], Brayton Layman A wrote: Caller/Agency: [facility]  Callback Number: [phone] Requesting: OT 1x8 , Home Health aid 1x6   Also requesting social worker consult.

## 2021-03-22 ENCOUNTER — Ambulatory Visit (INDEPENDENT_AMBULATORY_CARE_PROVIDER_SITE_OTHER): Payer: Medicare HMO | Admitting: Endocrinology

## 2021-03-22 ENCOUNTER — Other Ambulatory Visit: Payer: Self-pay

## 2021-03-22 VITALS — BP 128/78 | HR 76 | Ht 61.0 in

## 2021-03-22 DIAGNOSIS — E1065 Type 1 diabetes mellitus with hyperglycemia: Secondary | ICD-10-CM | POA: Diagnosis not present

## 2021-03-22 DIAGNOSIS — E1040 Type 1 diabetes mellitus with diabetic neuropathy, unspecified: Secondary | ICD-10-CM

## 2021-03-22 MED ORDER — PREGABALIN 150 MG PO CAPS: 150.0000 mg | ORAL_CAPSULE | Freq: Two times a day (BID) | ORAL | 1 refills | Status: DC

## 2021-03-22 NOTE — Progress Notes (Signed)
Patient ID: [name], female   DOB: 1966/05/27, 55 y.o.   MRN: [id number]           Reason for Appointment : Follow-up for Type 1 Diabetes  History of Present Illness          Diagnosis: Type 1 diabetes mellitus, date of diagnosis: 1977         Previous history:  She has mostly been followed by primary care physicians for her diabetes with consistently poor control She has had periodic episodes of ketoacidosis Previous insulin regimen:  BASAL insulin: LANTUS 15 units in a.m. MEALTIME insulin: Novolog variable doses: Usually 6-8 at breakfast, 2-3 at lunch and dinner   Previously using OmniPod insulin pump since 07/07/2019  Basal rates: Midnight-5 AM = 0. 95 units, 4 AM-6 AM = 0.55, 6 AM-10:30 AM = 0.7, 10:30 AM-4 PM = 1.0 and 4 PM = 1.15 with total 22.75  I/C ratio 1:1;   ISF: 40, target:120 with correction over 120  Recent history:   INSULIN regimen: Lantus 20 qd, Novolog "sliding scale"  A1c is 8.2 as of 9/15 at outside hospital, previously 9%  She has not been using the insulin pump since she has been hospitalized and in rehab  Current management, blood sugar patterns and problems identified:   She has only been using Lantus and NovoLog as above instead of the pump  However her monitoring has been with a Contour meter only and not using the Dexcom also for mild  She says she does not remember how to use the pump for the sensor and also is not able to afford the cost especially on the pump now  Most of her monitoring is in the morning  As shown below her blood sugars are persistently high recently despite taking 20 units of Lantus in the morning  In the last 4 weeks her blood sugars in the mornings are highly variable and also has 1 low reading She was also having ketoacidosis when admitted earlier this month  Currently because of leg weakness she is not able to ambulate and is still recovering from her tibial fracture   PRE-MEAL Fasting Lunch Dinner Bedtime Overall   Glucose range: 43-500+ 241-500+ 244-383    Mean/median: 357 426 306  357   POST-MEAL PC Breakfast PC Lunch PC Dinner  Glucose range:     Mean/median:      Prior  CGM use % of time  98  2-week average/GV  230/26  Time in range       20%  % Time Above 180  80  % Time above 250  35  % Time Below 70 0     PRE-MEAL Fasting Lunch Dinner Bedtime Overall  Glucose range:       Averages:  222   200  265  230   POST-MEAL PC Breakfast PC Lunch PC Dinner  Glucose range:     Averages:  252   226    Symptoms of hypoglycemia: Weakness, sometimes none Treatment of hypoglycemia: Snacks         Self-care: The diet that the patient has been following is: None  Mealtimes are: Breakfast egg, cheese, sausage, occasionally grits, toast;   Lunch: Sandwich or only snacks, dinner: baked chricken, rice, potatoes                Dietician consultation: Most recent: years ago.         CDE consultation: 04/2018  Wt Readings from Last 3 Encounters:  08/04/20 160 lb 12.8 oz (72.9 kg)  06/13/20 168 lb 9.6 oz (76.5 kg)  05/06/20 165 lb 12.8 oz (75.2 kg)   Diabetes labs:  Lab Results  Component Value Date   HGBA1C 8.2 03/09/2021   HGBA1C 9.0 (A) 08/04/2020   HGBA1C 6.9 (A) 04/26/2020   Lab Results  Component Value Date   MICROALBUR 3.7 (H) 06/13/2020   LDLCALC 80 08/04/2020   CREATININE 1.37 (H) 08/04/2020    Lab Results  Component Value Date   MICRALBCREAT 4.3 06/13/2020   MICRALBCREAT 17.1 04/28/2018     Allergies as of 03/22/2021       Reactions   Anesthetics, Amide Nausea And Vomiting   Chlorhexidine    Penicillins Diarrhea, Nausea And Vomiting, Other (See Comments)   Has patient had a PCN reaction causing immediate rash, facial/tongue/throat swelling, SOB or lightheadedness with hypotension: Yes Has patient had a PCN reaction causing severe rash involving mucus membranes or skin necrosis: No Has patient had a PCN reaction that required hospitalization No Has patient had a PCN  reaction occurring within the last 10 years: Yes  If all of the above answers are "NO", then may proceed with Cephalosporin use.   Buprenorphine Hcl Rash   Encainide Nausea And Vomiting   Metoclopramide Other (See Comments)   Currently no side effects, previously reportedly had dystonia, muscle rigidity, slurred speech, tongue swelling        Medication List        Accurate as of March 22, 2021 12:19 PM. If you have any questions, ask your nurse or doctor.          STOP taking these medications    insulin lispro 100 UNIT/ML injection Commonly known as: HUMALOG Stopped by: Elayne Snare, MD       TAKE these medications    aspirin EC 81 MG tablet Take 1 tablet (81 mg total) by mouth daily.   busPIRone 10 MG tablet Commonly known as: BUSPAR Take 1 tablet (10 mg total) by mouth 3 (three) times daily.   Contour Next Test test strip Generic drug: glucose blood TEST FOUR TIMES DAILY   docusate sodium 100 MG capsule Commonly known as: Colace Take 1 capsule (100 mg total) by mouth daily as needed for mild constipation.   dronabinol 5 MG capsule Commonly known as: MARINOL Take 1 capsule by mouth 2 (two) times daily before a meal.   furosemide 40 MG tablet Commonly known as: LASIX Take 1 tablet (40 mg total) by mouth daily.   insulin aspart 100 UNIT/ML injection Commonly known as: novoLOG Inject 100 Units into the skin 3 (three) times daily before meals. Use max of 100 units per day in insulin pump   insulin glargine 100 UNIT/ML injection Commonly known as: LANTUS Inject into the skin.   Insulin Syringe-Needle U-100 31G X 5/16" 0.5 ML Misc Commonly known as: BD Insulin Syringe Ultrafine 1 each by Does not apply route 4 (four) times daily.   Iron 325 (65 Fe) MG Tabs Take 1 tablet by mouth every other day.   loperamide 2 MG capsule Commonly known as: IMODIUM Take 1 capsule (2 mg total) by mouth 3 (three) times daily as needed for diarrhea or loose stools.    losartan 25 MG tablet Commonly known as: COZAAR Take 1 tablet (25 mg total) by mouth daily.   metoCLOPramide 10 MG tablet Commonly known as: REGLAN TAKE 1 TABLET(10 MG) BY MOUTH THREE TIMES DAILY BEFORE MEALS   metoprolol tartrate 25 MG  tablet Commonly known as: LOPRESSOR Take 0.5 tablets (12.5 mg total) by mouth 2 (two) times daily.   Omnipod DASH PDM (Gen 4) Kit by Does not apply route.   Omnipod DASH Pods (Gen 4) Misc CHANGE DASH PODS EVERY 48 HOURS AS DIRECTED   ondansetron 4 MG tablet Commonly known as: Zofran Take 1 tablet (4 mg total) by mouth every 8 (eight) hours as needed for nausea or vomiting.   pantoprazole 40 MG tablet Commonly known as: PROTONIX Take 1 tablet (40 mg total) by mouth daily.   polyethylene glycol 17 g packet Commonly known as: MIRALAX / GLYCOLAX Take 17 g by mouth 2 (two) times daily as needed for moderate constipation.   pregabalin 150 MG capsule Commonly known as: Lyrica Take 1 capsule (150 mg total) by mouth 2 (two) times daily.   rosuvastatin 10 MG tablet Commonly known as: CRESTOR Take 1 tablet (10 mg total) by mouth daily.   scopolamine 1 MG/3DAYS Commonly known as: TRANSDERM-SCOP Place onto the skin.   vitamin C 250 MG tablet Commonly known as: ASCORBIC ACID Take 1 tablet by mouth every other day.        Allergies:  Allergies  Allergen Reactions   Anesthetics, Amide Nausea And Vomiting   Chlorhexidine    Penicillins Diarrhea, Nausea And Vomiting and Other (See Comments)    Has patient had a PCN reaction causing immediate rash, facial/tongue/throat swelling, SOB or lightheadedness with hypotension: Yes Has patient had a PCN reaction causing severe rash involving mucus membranes or skin necrosis: No Has patient had a PCN reaction that required hospitalization No Has patient had a PCN reaction occurring within the last 10 years: Yes  If all of the above answers are "NO", then may proceed with Cephalosporin use.     Buprenorphine Hcl Rash   Encainide Nausea And Vomiting   Metoclopramide Other (See Comments)    Currently no side effects, previously reportedly had dystonia, muscle rigidity, slurred speech, tongue swelling    Past Medical History:  Diagnosis Date   AKI (acute kidney injury) ([place])    Anemia, iron deficiency    Anxiety and depression 05/18/2015   CAD (coronary artery disease), native coronary artery 01/11/2018   s/p CABG - Non-STEMI with severe three-vessel disease noted July 2019   Depression    Diabetic gastroparesis ([place])    Diabetic neuropathy, type I diabetes mellitus ([place]) 05/18/2015   DKA, type 1 ([place]) 03/18/2016   Essential hypertension    Gastroparesis    GERD (gastroesophageal reflux disease)    HLD (hyperlipidemia)    MDD (major depressive disorder), single episode, severe , no psychosis ([place])    Tardive dyskinesia    Vitamin B12 deficiency 08/16/2015    Past Surgical History:  Procedure Laterality Date   CARDIAC SURGERY     COLONOSCOPY  09/27/2014   at [place] N/A 01/14/2018   Procedure: CORONARY ARTERY BYPASS GRAFTING (CABG)X3, RIGHT AND LEFT SAPHENOUS VEIN HARVEST, MAMMARY TAKE DOWN. MAMMARY TO LAD, SVG TO PD, SVG TO DISTAL CIRC.;  Surgeon: Grace Isaac, MD;  Location: [place];  Service: Open Heart Surgery;  Laterality: N/A;   ESOPHAGOGASTRODUODENOSCOPY  09/27/2014   at [facility], Dr Rolan Lipa. biospy neg for celiac, neg for H pylori.    EYE SURGERY     gailstones     INTRAMEDULLARY (IM) NAIL INTERTROCHANTERIC Right 05/10/2019   Procedure: INTRAMEDULLARY (IM) NAIL INTERTROCHANTRIC;  Surgeon: Lovell Sheehan, MD;  Location: [facility];  Service: Orthopedics;  Laterality: Right;   IR FLUORO GUIDE CV LINE RIGHT  02/01/2017   IR FLUORO GUIDE CV LINE RIGHT  03/06/2017   IR FLUORO GUIDE CV LINE RIGHT  03/25/2017   IR GENERIC HISTORICAL  01/24/2016   IR FLUORO GUIDE CV LINE RIGHT 01/24/2016 Darrell K Allred, PA-C [facility]   IR GENERIC  HISTORICAL  01/24/2016   IR US GUIDE VASC ACCESS RIGHT 01/24/2016 Darrell K Allred, PA-C [facility]   IR US GUIDE VASC ACCESS RIGHT  02/01/2017   IR US GUIDE VASC ACCESS RIGHT  03/06/2017   IR US GUIDE VASC ACCESS RIGHT  03/25/2017   IR US GUIDE VASC ACCESS RIGHT  01/20/2019   IR VENIPUNCTURE 95YRS/OLDER BY MD  01/20/2019   LEFT HEART CATH AND CORONARY ANGIOGRAPHY N/A 01/07/2018   Procedure: LEFT HEART CATH AND CORONARY ANGIOGRAPHY;  Surgeon: Troy Sine, MD;  Location: [place] [facility];  Service: Cardiovascular;  Laterality: N/A;   POSTERIOR VITRECTOMY AND MEMBRANE PEEL-LEFT EYE  09/28/2002   POSTERIOR VITRECTOMY AND MEMBRANE PEEL-RIGHT EYE  03/16/2002   RETINAL DETACHMENT SURGERY     TEE WITHOUT CARDIOVERSION N/A 01/14/2018   Procedure: TRANSESOPHAGEAL ECHOCARDIOGRAM (TEE);  Surgeon: Grace Isaac, MD;  Location: [place];  Service: Open Heart Surgery;  Laterality: N/A;    Family History  Problem Relation Age of Onset   Cystic fibrosis Mother    Hypertension Father    Diabetes Brother    Hypertension Maternal Grandmother     Social History:  reports that she has never smoked. She has never used smokeless tobacco. She reports current drug use. Drug: Marijuana. She reports that she does not drink alcohol.      Review of Systems      Lipids: She had been taking Crestor 10 mg prescribed by cardiologist  Lab Results  Component Value Date   CHOL 157 08/04/2020   HDL 62.20 08/04/2020   LDLCALC 80 08/04/2020   TRIG 75.0 08/04/2020   CHOLHDL 3 08/04/2020     DIABETES COMPLICATIONS: Gastroparesis treated with Zofran as needed Also on Reglan   On Lyrica 150 mg for peripheral neuropathy with pain with generally good relief  Currently on 25 mg losartan  Physical Examination:  BP 128/78 (BP Location: Left Arm, Patient Position: Sitting, Cuff Size: Normal)   Pulse 76   Ht $R'5\' 1"'iE$  (1.549 m)   SpO2 98%   BMI 30.38 kg/m       ASSESSMENT:  Diabetes type 1, long-standing with  multiple complications  She is currently only on Lantus and  See history of present illness for detailed discussion of current diabetes management, blood sugar patterns and problems identified  Her A1c is currently 8.2  As above her blood sugars are quite out of control recently averaging over 300 with basal bolus insulin regimen  Also having episodes of DKA frequently  She has not been seen in follow-up for several months  However she is not understanding how to take her insulin and is only taking as needed insulin sometimes for her NovoLog She is also not able to focus on how to use the Dexcom sensor She will not be able to use the pump unless she cannot afford it when she is out of the donut hole However she is staying with her sister and may be able to get some help  Neuropathy: Has persistent symptoms and will refill her Lyrica  Persistent gastroparesis symptoms  PLAN:   Discussed using her pump  controller to calculate her bolus insulin doses with the NovoLog injections  Discussed differences between correction and mealtime insulin doses and need to combine them together  She will use 1: 10 carb ratio for now  She will continue to use a 1: 35 correction factor as previously programmed   Since she likely will have better flexibility and better control over 24 hours with split doses of Lantus she will go to 12 units twice daily and titrate periodically based on morning readings, this was discussed in detail  Hopefully can start using the pump soon If she is not sure how much she is eating she can take daily NovoLog dosage right after finishing eating  Needs more regular follow-up  Patient Instructions  Lantus 12 units at Bfst and supper   Go up to 14 units in pm if MORNING sugars Stay >160 after 4 days  Call if blood sugars are consistently over 200  NOVOLOG must be taken based on how much you are eating added on to the blood sugar level before meals  Can divide the  number of grams of carbohydrate by 10 to get the NovoLog dose for food so if you are eating 30 g you will take 3 units  You can use the OmniPod controller to calculate your insulin dose based on your carbs and your blood sugar level    Elayne Snare 03/22/2021, 12:19 PM     Note: This note was prepared with Dragon voice recognition system technology. Any transcriptional errors that result from this process are unintentional.

## 2021-03-22 NOTE — Telephone Encounter (Signed)
[facility] called back about status of home health order

## 2021-03-22 NOTE — Patient Instructions (Addendum)
Lantus 12 units at Bfst and supper   Go up to 14 units in pm if MORNING sugars Stay >160 after 4 days  Call if blood sugars are consistently over 200  NOVOLOG must be taken based on how much you are eating added on to the blood sugar level before meals  Can divide the number of grams of carbohydrate by 10 to get the NovoLog dose for food so if you are eating 30 g you will take 3 units  You can use the OmniPod controller to calculate your insulin dose based on your carbs and your blood sugar level

## 2021-03-22 NOTE — Telephone Encounter (Signed)
Okay to give verbal order.

## 2021-03-23 ENCOUNTER — Telehealth: Payer: Self-pay | Admitting: Dietician

## 2021-03-23 DIAGNOSIS — E878 Other disorders of electrolyte and fluid balance, not elsewhere classified: Secondary | ICD-10-CM | POA: Diagnosis not present

## 2021-03-23 DIAGNOSIS — E1065 Type 1 diabetes mellitus with hyperglycemia: Secondary | ICD-10-CM | POA: Diagnosis not present

## 2021-03-23 DIAGNOSIS — L89622 Pressure ulcer of left heel, stage 2: Secondary | ICD-10-CM | POA: Diagnosis not present

## 2021-03-23 DIAGNOSIS — K5901 Slow transit constipation: Secondary | ICD-10-CM | POA: Diagnosis not present

## 2021-03-23 DIAGNOSIS — L89612 Pressure ulcer of right heel, stage 2: Secondary | ICD-10-CM | POA: Diagnosis not present

## 2021-03-23 DIAGNOSIS — M6701 Short Achilles tendon (acquired), right ankle: Secondary | ICD-10-CM | POA: Diagnosis not present

## 2021-03-23 DIAGNOSIS — S82141D Displaced bicondylar fracture of right tibia, subsequent encounter for closed fracture with routine healing: Secondary | ICD-10-CM | POA: Diagnosis not present

## 2021-03-23 DIAGNOSIS — E1043 Type 1 diabetes mellitus with diabetic autonomic (poly)neuropathy: Secondary | ICD-10-CM | POA: Diagnosis not present

## 2021-03-23 DIAGNOSIS — K3184 Gastroparesis: Secondary | ICD-10-CM | POA: Diagnosis not present

## 2021-03-23 NOTE — Telephone Encounter (Signed)
Returned call made aware of below message .

## 2021-03-23 NOTE — Telephone Encounter (Signed)
Patient called. She was seen by Dr. Dwyane Dee yesterday. She states that she did not use the Omnipod for about 5 months as she was in rehab after a broken bone and needs a refresher. She has started her Dexcom without problems. She has started her Omnipod.  She states the PDM screen is going in and out.  Discussed she should contact Omnipod if this continues. She states that she cannot remember how to give a bolus. Talked her through how to do this.  She was able to give a bolus for her meal and to correct her blood glucose using the Dexcom reading. She is not counting carbs.  Pump is set up with a carb ratio of 1:1.  Patient entered the amount of insulin into the carb spot.  Patient to call for further questions.  Antonieta Iba, RD, LDN, CDCES

## 2021-03-24 DIAGNOSIS — E1065 Type 1 diabetes mellitus with hyperglycemia: Secondary | ICD-10-CM | POA: Diagnosis not present

## 2021-03-27 DIAGNOSIS — S82141D Displaced bicondylar fracture of right tibia, subsequent encounter for closed fracture with routine healing: Secondary | ICD-10-CM | POA: Diagnosis not present

## 2021-03-27 DIAGNOSIS — L89612 Pressure ulcer of right heel, stage 2: Secondary | ICD-10-CM | POA: Diagnosis not present

## 2021-03-27 DIAGNOSIS — E1043 Type 1 diabetes mellitus with diabetic autonomic (poly)neuropathy: Secondary | ICD-10-CM | POA: Diagnosis not present

## 2021-03-27 DIAGNOSIS — L89622 Pressure ulcer of left heel, stage 2: Secondary | ICD-10-CM | POA: Diagnosis not present

## 2021-03-27 DIAGNOSIS — E878 Other disorders of electrolyte and fluid balance, not elsewhere classified: Secondary | ICD-10-CM | POA: Diagnosis not present

## 2021-03-27 DIAGNOSIS — E1065 Type 1 diabetes mellitus with hyperglycemia: Secondary | ICD-10-CM | POA: Diagnosis not present

## 2021-03-27 DIAGNOSIS — K5901 Slow transit constipation: Secondary | ICD-10-CM | POA: Diagnosis not present

## 2021-03-27 DIAGNOSIS — K3184 Gastroparesis: Secondary | ICD-10-CM | POA: Diagnosis not present

## 2021-03-27 DIAGNOSIS — M6701 Short Achilles tendon (acquired), right ankle: Secondary | ICD-10-CM | POA: Diagnosis not present

## 2021-03-28 ENCOUNTER — Telehealth: Payer: Self-pay | Admitting: Endocrinology

## 2021-03-28 DIAGNOSIS — E1065 Type 1 diabetes mellitus with hyperglycemia: Secondary | ICD-10-CM | POA: Diagnosis not present

## 2021-03-28 DIAGNOSIS — E878 Other disorders of electrolyte and fluid balance, not elsewhere classified: Secondary | ICD-10-CM | POA: Diagnosis not present

## 2021-03-28 DIAGNOSIS — E1043 Type 1 diabetes mellitus with diabetic autonomic (poly)neuropathy: Secondary | ICD-10-CM | POA: Diagnosis not present

## 2021-03-28 DIAGNOSIS — S82141D Displaced bicondylar fracture of right tibia, subsequent encounter for closed fracture with routine healing: Secondary | ICD-10-CM | POA: Diagnosis not present

## 2021-03-28 DIAGNOSIS — L89622 Pressure ulcer of left heel, stage 2: Secondary | ICD-10-CM | POA: Diagnosis not present

## 2021-03-28 DIAGNOSIS — K5901 Slow transit constipation: Secondary | ICD-10-CM | POA: Diagnosis not present

## 2021-03-28 DIAGNOSIS — K3184 Gastroparesis: Secondary | ICD-10-CM | POA: Diagnosis not present

## 2021-03-28 DIAGNOSIS — M6701 Short Achilles tendon (acquired), right ankle: Secondary | ICD-10-CM | POA: Diagnosis not present

## 2021-03-28 DIAGNOSIS — L89612 Pressure ulcer of right heel, stage 2: Secondary | ICD-10-CM | POA: Diagnosis not present

## 2021-03-28 NOTE — Telephone Encounter (Signed)
Patient talked through making the changes to her basal rates, as well as adding 2 extra units to the bolus meal.  She had no final questions

## 2021-03-28 NOTE — Telephone Encounter (Signed)
Pt calling, for the past 3/4 nights she's been waking up around 4am to her BS monitor beeping about her low sugars (around 40s/60s). Pt needs advice on what to do next number [phone].

## 2021-03-30 DIAGNOSIS — L89612 Pressure ulcer of right heel, stage 2: Secondary | ICD-10-CM | POA: Diagnosis not present

## 2021-03-30 DIAGNOSIS — K5901 Slow transit constipation: Secondary | ICD-10-CM | POA: Diagnosis not present

## 2021-03-30 DIAGNOSIS — M6701 Short Achilles tendon (acquired), right ankle: Secondary | ICD-10-CM | POA: Diagnosis not present

## 2021-03-30 DIAGNOSIS — E878 Other disorders of electrolyte and fluid balance, not elsewhere classified: Secondary | ICD-10-CM | POA: Diagnosis not present

## 2021-03-30 DIAGNOSIS — E1065 Type 1 diabetes mellitus with hyperglycemia: Secondary | ICD-10-CM | POA: Diagnosis not present

## 2021-03-30 DIAGNOSIS — L89622 Pressure ulcer of left heel, stage 2: Secondary | ICD-10-CM | POA: Diagnosis not present

## 2021-03-30 DIAGNOSIS — K3184 Gastroparesis: Secondary | ICD-10-CM | POA: Diagnosis not present

## 2021-03-30 DIAGNOSIS — S82141D Displaced bicondylar fracture of right tibia, subsequent encounter for closed fracture with routine healing: Secondary | ICD-10-CM | POA: Diagnosis not present

## 2021-03-30 DIAGNOSIS — E1043 Type 1 diabetes mellitus with diabetic autonomic (poly)neuropathy: Secondary | ICD-10-CM | POA: Diagnosis not present

## 2021-03-31 NOTE — Telephone Encounter (Signed)
I called this pt and she seemed confused and stated that she hasn't requested a Education officer, museum.

## 2021-04-01 NOTE — Telephone Encounter (Signed)
Looks like that was the message from the [place] agency on 03/21/21. Disregard if patient declines. Thanks

## 2021-04-03 ENCOUNTER — Ambulatory Visit: Payer: Medicare HMO | Admitting: Family Medicine

## 2021-04-03 DIAGNOSIS — E1043 Type 1 diabetes mellitus with diabetic autonomic (poly)neuropathy: Secondary | ICD-10-CM | POA: Diagnosis not present

## 2021-04-03 DIAGNOSIS — E878 Other disorders of electrolyte and fluid balance, not elsewhere classified: Secondary | ICD-10-CM | POA: Diagnosis not present

## 2021-04-03 DIAGNOSIS — K5901 Slow transit constipation: Secondary | ICD-10-CM | POA: Diagnosis not present

## 2021-04-03 DIAGNOSIS — L89622 Pressure ulcer of left heel, stage 2: Secondary | ICD-10-CM | POA: Diagnosis not present

## 2021-04-03 DIAGNOSIS — E1065 Type 1 diabetes mellitus with hyperglycemia: Secondary | ICD-10-CM | POA: Diagnosis not present

## 2021-04-03 DIAGNOSIS — K3184 Gastroparesis: Secondary | ICD-10-CM | POA: Diagnosis not present

## 2021-04-03 DIAGNOSIS — M6701 Short Achilles tendon (acquired), right ankle: Secondary | ICD-10-CM | POA: Diagnosis not present

## 2021-04-03 DIAGNOSIS — L89612 Pressure ulcer of right heel, stage 2: Secondary | ICD-10-CM | POA: Diagnosis not present

## 2021-04-03 DIAGNOSIS — S82141D Displaced bicondylar fracture of right tibia, subsequent encounter for closed fracture with routine healing: Secondary | ICD-10-CM | POA: Diagnosis not present

## 2021-04-04 ENCOUNTER — Ambulatory Visit: Payer: Medicare HMO | Admitting: Family Medicine

## 2021-04-05 DIAGNOSIS — M6701 Short Achilles tendon (acquired), right ankle: Secondary | ICD-10-CM | POA: Diagnosis not present

## 2021-04-05 DIAGNOSIS — L89612 Pressure ulcer of right heel, stage 2: Secondary | ICD-10-CM | POA: Diagnosis not present

## 2021-04-05 DIAGNOSIS — K5901 Slow transit constipation: Secondary | ICD-10-CM | POA: Diagnosis not present

## 2021-04-05 DIAGNOSIS — E878 Other disorders of electrolyte and fluid balance, not elsewhere classified: Secondary | ICD-10-CM | POA: Diagnosis not present

## 2021-04-05 DIAGNOSIS — E1065 Type 1 diabetes mellitus with hyperglycemia: Secondary | ICD-10-CM | POA: Diagnosis not present

## 2021-04-05 DIAGNOSIS — S82141D Displaced bicondylar fracture of right tibia, subsequent encounter for closed fracture with routine healing: Secondary | ICD-10-CM | POA: Diagnosis not present

## 2021-04-05 DIAGNOSIS — E1043 Type 1 diabetes mellitus with diabetic autonomic (poly)neuropathy: Secondary | ICD-10-CM | POA: Diagnosis not present

## 2021-04-05 DIAGNOSIS — K3184 Gastroparesis: Secondary | ICD-10-CM | POA: Diagnosis not present

## 2021-04-05 DIAGNOSIS — L89622 Pressure ulcer of left heel, stage 2: Secondary | ICD-10-CM | POA: Diagnosis not present

## 2021-04-07 DIAGNOSIS — K5901 Slow transit constipation: Secondary | ICD-10-CM | POA: Diagnosis not present

## 2021-04-07 DIAGNOSIS — S82141D Displaced bicondylar fracture of right tibia, subsequent encounter for closed fracture with routine healing: Secondary | ICD-10-CM | POA: Diagnosis not present

## 2021-04-07 DIAGNOSIS — L89622 Pressure ulcer of left heel, stage 2: Secondary | ICD-10-CM | POA: Diagnosis not present

## 2021-04-07 DIAGNOSIS — E878 Other disorders of electrolyte and fluid balance, not elsewhere classified: Secondary | ICD-10-CM | POA: Diagnosis not present

## 2021-04-07 DIAGNOSIS — E1065 Type 1 diabetes mellitus with hyperglycemia: Secondary | ICD-10-CM | POA: Diagnosis not present

## 2021-04-07 DIAGNOSIS — M6701 Short Achilles tendon (acquired), right ankle: Secondary | ICD-10-CM | POA: Diagnosis not present

## 2021-04-07 DIAGNOSIS — E1043 Type 1 diabetes mellitus with diabetic autonomic (poly)neuropathy: Secondary | ICD-10-CM | POA: Diagnosis not present

## 2021-04-07 DIAGNOSIS — L89612 Pressure ulcer of right heel, stage 2: Secondary | ICD-10-CM | POA: Diagnosis not present

## 2021-04-07 DIAGNOSIS — K3184 Gastroparesis: Secondary | ICD-10-CM | POA: Diagnosis not present

## 2021-04-10 DIAGNOSIS — K5901 Slow transit constipation: Secondary | ICD-10-CM | POA: Diagnosis not present

## 2021-04-10 DIAGNOSIS — E1065 Type 1 diabetes mellitus with hyperglycemia: Secondary | ICD-10-CM | POA: Diagnosis not present

## 2021-04-10 DIAGNOSIS — E1043 Type 1 diabetes mellitus with diabetic autonomic (poly)neuropathy: Secondary | ICD-10-CM | POA: Diagnosis not present

## 2021-04-10 DIAGNOSIS — S82141D Displaced bicondylar fracture of right tibia, subsequent encounter for closed fracture with routine healing: Secondary | ICD-10-CM | POA: Diagnosis not present

## 2021-04-10 DIAGNOSIS — K3184 Gastroparesis: Secondary | ICD-10-CM | POA: Diagnosis not present

## 2021-04-10 DIAGNOSIS — L89622 Pressure ulcer of left heel, stage 2: Secondary | ICD-10-CM | POA: Diagnosis not present

## 2021-04-10 DIAGNOSIS — E878 Other disorders of electrolyte and fluid balance, not elsewhere classified: Secondary | ICD-10-CM | POA: Diagnosis not present

## 2021-04-10 DIAGNOSIS — L89612 Pressure ulcer of right heel, stage 2: Secondary | ICD-10-CM | POA: Diagnosis not present

## 2021-04-10 DIAGNOSIS — M6701 Short Achilles tendon (acquired), right ankle: Secondary | ICD-10-CM | POA: Diagnosis not present

## 2021-04-12 DIAGNOSIS — M6701 Short Achilles tendon (acquired), right ankle: Secondary | ICD-10-CM | POA: Diagnosis not present

## 2021-04-12 DIAGNOSIS — L89612 Pressure ulcer of right heel, stage 2: Secondary | ICD-10-CM | POA: Diagnosis not present

## 2021-04-12 DIAGNOSIS — K5901 Slow transit constipation: Secondary | ICD-10-CM | POA: Diagnosis not present

## 2021-04-12 DIAGNOSIS — K3184 Gastroparesis: Secondary | ICD-10-CM | POA: Diagnosis not present

## 2021-04-12 DIAGNOSIS — E1043 Type 1 diabetes mellitus with diabetic autonomic (poly)neuropathy: Secondary | ICD-10-CM | POA: Diagnosis not present

## 2021-04-12 DIAGNOSIS — E878 Other disorders of electrolyte and fluid balance, not elsewhere classified: Secondary | ICD-10-CM | POA: Diagnosis not present

## 2021-04-12 DIAGNOSIS — L89622 Pressure ulcer of left heel, stage 2: Secondary | ICD-10-CM | POA: Diagnosis not present

## 2021-04-12 DIAGNOSIS — E1065 Type 1 diabetes mellitus with hyperglycemia: Secondary | ICD-10-CM | POA: Diagnosis not present

## 2021-04-12 DIAGNOSIS — S82141D Displaced bicondylar fracture of right tibia, subsequent encounter for closed fracture with routine healing: Secondary | ICD-10-CM | POA: Diagnosis not present

## 2021-04-13 ENCOUNTER — Telehealth: Payer: Self-pay | Admitting: Family Medicine

## 2021-04-13 DIAGNOSIS — E1065 Type 1 diabetes mellitus with hyperglycemia: Secondary | ICD-10-CM | POA: Diagnosis not present

## 2021-04-13 DIAGNOSIS — L89622 Pressure ulcer of left heel, stage 2: Secondary | ICD-10-CM | POA: Diagnosis not present

## 2021-04-13 DIAGNOSIS — E878 Other disorders of electrolyte and fluid balance, not elsewhere classified: Secondary | ICD-10-CM | POA: Diagnosis not present

## 2021-04-13 DIAGNOSIS — E1043 Type 1 diabetes mellitus with diabetic autonomic (poly)neuropathy: Secondary | ICD-10-CM | POA: Diagnosis not present

## 2021-04-13 DIAGNOSIS — M6701 Short Achilles tendon (acquired), right ankle: Secondary | ICD-10-CM | POA: Diagnosis not present

## 2021-04-13 DIAGNOSIS — S82141D Displaced bicondylar fracture of right tibia, subsequent encounter for closed fracture with routine healing: Secondary | ICD-10-CM | POA: Diagnosis not present

## 2021-04-13 DIAGNOSIS — L89612 Pressure ulcer of right heel, stage 2: Secondary | ICD-10-CM | POA: Diagnosis not present

## 2021-04-13 DIAGNOSIS — K3184 Gastroparesis: Secondary | ICD-10-CM | POA: Diagnosis not present

## 2021-04-13 DIAGNOSIS — K5901 Slow transit constipation: Secondary | ICD-10-CM | POA: Diagnosis not present

## 2021-04-13 NOTE — Telephone Encounter (Signed)
Copied from [place] [phone]. Topic: General - Other >> Apr 12, 2021  3:19 PM Yvette Rack wrote: Reason for CRM: Webb Silversmith with [facility] stated pt requests placement in to a nursing home. Webb Silversmith will fax over [facility] form for review and to be signed. Fax# [phone]   FYI:  The fax machine has not been working for the past 2 days. Will see if form is received today

## 2021-04-13 NOTE — Telephone Encounter (Signed)
I will start a FL2 form since the fax machine is down. Does patient need a office visit.

## 2021-04-13 NOTE — Telephone Encounter (Signed)
Pt was called and patient states that she is agreeable to go to a nursing home due to her not having anyone to help take care of her when she gets sick. Pt states that she had a fall and now she uses a walker and needs assistance.  Pt has been set up with an telephone appointment to discuss FL2 form. Appointment has been set for 04/20/21 at 1010

## 2021-04-14 DIAGNOSIS — L89612 Pressure ulcer of right heel, stage 2: Secondary | ICD-10-CM | POA: Diagnosis not present

## 2021-04-14 DIAGNOSIS — L89622 Pressure ulcer of left heel, stage 2: Secondary | ICD-10-CM | POA: Diagnosis not present

## 2021-04-14 DIAGNOSIS — K3184 Gastroparesis: Secondary | ICD-10-CM | POA: Diagnosis not present

## 2021-04-14 DIAGNOSIS — E878 Other disorders of electrolyte and fluid balance, not elsewhere classified: Secondary | ICD-10-CM | POA: Diagnosis not present

## 2021-04-14 DIAGNOSIS — M6701 Short Achilles tendon (acquired), right ankle: Secondary | ICD-10-CM | POA: Diagnosis not present

## 2021-04-14 DIAGNOSIS — S82141D Displaced bicondylar fracture of right tibia, subsequent encounter for closed fracture with routine healing: Secondary | ICD-10-CM | POA: Diagnosis not present

## 2021-04-14 DIAGNOSIS — E1043 Type 1 diabetes mellitus with diabetic autonomic (poly)neuropathy: Secondary | ICD-10-CM | POA: Diagnosis not present

## 2021-04-14 DIAGNOSIS — E1065 Type 1 diabetes mellitus with hyperglycemia: Secondary | ICD-10-CM | POA: Diagnosis not present

## 2021-04-14 DIAGNOSIS — K5901 Slow transit constipation: Secondary | ICD-10-CM | POA: Diagnosis not present

## 2021-04-14 NOTE — Telephone Encounter (Signed)
Ok. I will complete form at office visit

## 2021-04-17 DIAGNOSIS — K5901 Slow transit constipation: Secondary | ICD-10-CM | POA: Diagnosis not present

## 2021-04-17 DIAGNOSIS — L89612 Pressure ulcer of right heel, stage 2: Secondary | ICD-10-CM | POA: Diagnosis not present

## 2021-04-17 DIAGNOSIS — M6701 Short Achilles tendon (acquired), right ankle: Secondary | ICD-10-CM | POA: Diagnosis not present

## 2021-04-17 DIAGNOSIS — K3184 Gastroparesis: Secondary | ICD-10-CM | POA: Diagnosis not present

## 2021-04-17 DIAGNOSIS — E878 Other disorders of electrolyte and fluid balance, not elsewhere classified: Secondary | ICD-10-CM | POA: Diagnosis not present

## 2021-04-17 DIAGNOSIS — L89622 Pressure ulcer of left heel, stage 2: Secondary | ICD-10-CM | POA: Diagnosis not present

## 2021-04-17 DIAGNOSIS — E1043 Type 1 diabetes mellitus with diabetic autonomic (poly)neuropathy: Secondary | ICD-10-CM | POA: Diagnosis not present

## 2021-04-17 DIAGNOSIS — E1065 Type 1 diabetes mellitus with hyperglycemia: Secondary | ICD-10-CM | POA: Diagnosis not present

## 2021-04-17 DIAGNOSIS — S82141D Displaced bicondylar fracture of right tibia, subsequent encounter for closed fracture with routine healing: Secondary | ICD-10-CM | POA: Diagnosis not present

## 2021-04-18 DIAGNOSIS — K5901 Slow transit constipation: Secondary | ICD-10-CM | POA: Diagnosis not present

## 2021-04-18 DIAGNOSIS — M6701 Short Achilles tendon (acquired), right ankle: Secondary | ICD-10-CM | POA: Diagnosis not present

## 2021-04-18 DIAGNOSIS — K3184 Gastroparesis: Secondary | ICD-10-CM | POA: Diagnosis not present

## 2021-04-18 DIAGNOSIS — L89612 Pressure ulcer of right heel, stage 2: Secondary | ICD-10-CM | POA: Diagnosis not present

## 2021-04-18 DIAGNOSIS — E878 Other disorders of electrolyte and fluid balance, not elsewhere classified: Secondary | ICD-10-CM | POA: Diagnosis not present

## 2021-04-18 DIAGNOSIS — E1065 Type 1 diabetes mellitus with hyperglycemia: Secondary | ICD-10-CM | POA: Diagnosis not present

## 2021-04-18 DIAGNOSIS — E1043 Type 1 diabetes mellitus with diabetic autonomic (poly)neuropathy: Secondary | ICD-10-CM | POA: Diagnosis not present

## 2021-04-18 DIAGNOSIS — L89622 Pressure ulcer of left heel, stage 2: Secondary | ICD-10-CM | POA: Diagnosis not present

## 2021-04-18 DIAGNOSIS — S82141D Displaced bicondylar fracture of right tibia, subsequent encounter for closed fracture with routine healing: Secondary | ICD-10-CM | POA: Diagnosis not present

## 2021-04-20 ENCOUNTER — Other Ambulatory Visit: Payer: Self-pay

## 2021-04-20 ENCOUNTER — Telehealth: Payer: Self-pay

## 2021-04-20 ENCOUNTER — Ambulatory Visit: Payer: Medicare HMO | Attending: Family Medicine | Admitting: Family Medicine

## 2021-04-20 ENCOUNTER — Encounter: Payer: Self-pay | Admitting: Family Medicine

## 2021-04-20 DIAGNOSIS — E1065 Type 1 diabetes mellitus with hyperglycemia: Secondary | ICD-10-CM | POA: Diagnosis not present

## 2021-04-20 DIAGNOSIS — N3000 Acute cystitis without hematuria: Secondary | ICD-10-CM | POA: Diagnosis not present

## 2021-04-20 DIAGNOSIS — E1043 Type 1 diabetes mellitus with diabetic autonomic (poly)neuropathy: Secondary | ICD-10-CM | POA: Diagnosis not present

## 2021-04-20 DIAGNOSIS — Z1211 Encounter for screening for malignant neoplasm of colon: Secondary | ICD-10-CM

## 2021-04-20 DIAGNOSIS — S728X1D Other fracture of right femur, subsequent encounter for closed fracture with routine healing: Secondary | ICD-10-CM | POA: Diagnosis not present

## 2021-04-20 DIAGNOSIS — E878 Other disorders of electrolyte and fluid balance, not elsewhere classified: Secondary | ICD-10-CM | POA: Diagnosis not present

## 2021-04-20 DIAGNOSIS — L89622 Pressure ulcer of left heel, stage 2: Secondary | ICD-10-CM | POA: Diagnosis not present

## 2021-04-20 DIAGNOSIS — S82141D Displaced bicondylar fracture of right tibia, subsequent encounter for closed fracture with routine healing: Secondary | ICD-10-CM | POA: Diagnosis not present

## 2021-04-20 DIAGNOSIS — K3184 Gastroparesis: Secondary | ICD-10-CM | POA: Diagnosis not present

## 2021-04-20 DIAGNOSIS — L89612 Pressure ulcer of right heel, stage 2: Secondary | ICD-10-CM | POA: Diagnosis not present

## 2021-04-20 DIAGNOSIS — K5901 Slow transit constipation: Secondary | ICD-10-CM | POA: Diagnosis not present

## 2021-04-20 DIAGNOSIS — M6701 Short Achilles tendon (acquired), right ankle: Secondary | ICD-10-CM | POA: Diagnosis not present

## 2021-04-20 DIAGNOSIS — Z1231 Encounter for screening mammogram for malignant neoplasm of breast: Secondary | ICD-10-CM

## 2021-04-20 MED ORDER — NITROFURANTOIN MONOHYD MACRO 100 MG PO CAPS
100.0000 mg | ORAL_CAPSULE | Freq: Two times a day (BID) | ORAL | 0 refills | Status: DC
Start: 2021-04-20 — End: 2021-08-28

## 2021-04-20 NOTE — Telephone Encounter (Signed)
Call placed to patient regarding her request for facility placement. She explained that sometimes she can't do things for herself and her sister is not able to care for her. This is especially a problem when she has a gastroparesis flare and is weak for even a few days.  She spoke to [name], SW/[facility] [place] and decided that she should be in a nursing facility. She does not have Medicaid but [name] told her that she would qualify for Medicaid in a facility. This would be special assistance Medicaid.    [name] said that she was just speaking to her aunt and her aunt was trying to talk her out of going to a facility but [name] believes that she could use the extra help.  She is young but at times is unable to care for herself.  .  This CM explained the difference between a SNF and ALF.  She had not considered ALF but thinks that may be more appropriate for her. This CM also explained that in either facility she would most likely share a room and she would have to relinquish almost all of her monthly disability check to the facility.  She would be left with about $30-60/month.   She has not spoken to anyone at a nursing facility and has not toured any facilities.    This CM encouraged her to call some facilities and explain her needs. She can also look at information about the facilities on line and she said her sister can take her to visit the facilities when she is off from work.  Provided the patient with the website and phone number for the [facility] and encouraged her to contact them about facilities that might best meet her needs.    She also said that she would like to speak with [name], SW again but she did not have a number to reach her.  Informed her that this CM will call [facility] and try to contact [name] and have her call the patient.   This CM encouraged the patient to call this CM with any questions and reminded her of the importance of  finding a facility that works best for her and where she will feel the most comfortable and allow her to remain as independent as possible. The FL2 can be done when she has chosen a facility/facilities that she is interested in.

## 2021-04-20 NOTE — Progress Notes (Signed)
Virtual Visit via Telephone Note  I connected with [name], on 04/20/2021 at 10:43 AM by telephone due to the COVID-19 pandemic and verified that I am speaking with the correct person using two identifiers.   Consent: I discussed the limitations, risks, security and privacy concerns of performing an evaluation and management service by telephone and the availability of in person appointments. I also discussed with the patient that there may be a patient responsible charge related to this service. The patient expressed understanding and agreed to proceed.   Location of Patient: Home  Location of Provider: Clinic   Persons participating in Telemedicine visit: English Brandyn Thien Farrington-CMA Dr. Margarita Rana     History of Present Illness: [name] is a 55 y.o. year old female with a history of uncontrolled type 1 diabetes mellitus (A1c 7.2), diabetic neuropathy, diabetic gastroparesis, hypertension, anemia, anxiety and depression, multiple ED visits and hospitalizations for diabetic gastroparesis, CAD s/p CABG x3 (LIMA to LAD, SVG to PDA, SVG to OM) in 12/2017, R femoral fracture (s/p intramedullary nail in 04/2019).    She took a fall and broke her R tibia in 09/2020 while she was out looking at houses as she tripped and fell due to her poor vision. She is not in pain now but has foot drop in her RLE. She is being worked up for a brace. She is requesting long term Nursing placement as she is by herself most of the time and her Sister works 12 hours and is unable to care for her. Her Diabetes is managed by Dr Dwyane Dee- Endocrine.  She sometimes has dysuria with last episode yesterday and urine has been cloudy. She has no flank pain or pelvic pain, no fever, no frequency. Past Medical History:  Diagnosis Date   AKI (acute kidney injury) ([facility])    Anemia, iron deficiency    Anxiety and depression 05/18/2015   CAD (coronary artery disease), native coronary artery 01/11/2018    s/p CABG - Non-STEMI with severe three-vessel disease noted July 2019   Depression    Diabetic gastroparesis ([facility])    Diabetic neuropathy, type I diabetes mellitus ([place]) 05/18/2015   DKA, type 1 ([place]) 03/18/2016   Essential hypertension    Gastroparesis    GERD (gastroesophageal reflux disease)    HLD (hyperlipidemia)    MDD (major depressive disorder), single episode, severe , no psychosis ([facility])    Tardive dyskinesia    Vitamin B12 deficiency 08/16/2015   Allergies  Allergen Reactions   Anesthetics, Amide Nausea And Vomiting   Chlorhexidine    Penicillins Diarrhea, Nausea And Vomiting and Other (See Comments)    Has patient had a PCN reaction causing immediate rash, facial/tongue/throat swelling, SOB or lightheadedness with hypotension: Yes Has patient had a PCN reaction causing severe rash involving mucus membranes or skin necrosis: No Has patient had a PCN reaction that required hospitalization No Has patient had a PCN reaction occurring within the last 10 years: Yes  If all of the above answers are "NO", then may proceed with Cephalosporin use.    Buprenorphine Hcl Rash   Encainide Nausea And Vomiting   Metoclopramide Other (See Comments)    Currently no side effects, previously reportedly had dystonia, muscle rigidity, slurred speech, tongue swelling    Current Outpatient Medications on File Prior to Visit  Medication Sig Dispense Refill   aspirin EC 81 MG tablet Take 1 tablet (81 mg total) by mouth daily.     busPIRone (BUSPAR) 10 MG tablet Take  1 tablet (10 mg total) by mouth 3 (three) times daily. 90 tablet 3   docusate sodium (COLACE) 100 MG capsule Take 1 capsule (100 mg total) by mouth daily as needed for mild constipation. 10 capsule 0   dronabinol (MARINOL) 5 MG capsule Take 1 capsule by mouth 2 (two) times daily before a meal.     Ferrous Sulfate (IRON) 325 (65 Fe) MG TABS Take 1 tablet by mouth every other day.     furosemide (LASIX) 40 MG tablet Take 1 tablet (40 mg  total) by mouth daily. 90 tablet 3   glucose blood (CONTOUR NEXT TEST) test strip TEST FOUR TIMES DAILY 150 strip 0   insulin aspart (NOVOLOG) 100 UNIT/ML injection Inject 100 Units into the skin 3 (three) times daily before meals. Use max of 100 units per day in insulin pump     Insulin Disposable Pump (OMNIPOD DASH 5 PACK PODS) MISC CHANGE DASH PODS EVERY 48 HOURS AS DIRECTED 45 each 2   Insulin Disposable Pump (OMNIPOD DASH SYSTEM) KIT by Does not apply route.     insulin glargine (LANTUS) 100 UNIT/ML injection Inject into the skin.     Insulin Syringe-Needle U-100 (BD INSULIN SYRINGE ULTRAFINE) 31G X 5/16" 0.5 ML MISC 1 each by Does not apply route 4 (four) times daily. 120 each 5   loperamide (IMODIUM) 2 MG capsule Take 1 capsule (2 mg total) by mouth 3 (three) times daily as needed for diarrhea or loose stools. 30 capsule 0   losartan (COZAAR) 25 MG tablet Take 1 tablet (25 mg total) by mouth daily. 90 tablet 3   metoCLOPramide (REGLAN) 10 MG tablet TAKE 1 TABLET(10 MG) BY MOUTH THREE TIMES DAILY BEFORE MEALS 90 tablet 0   metoprolol tartrate (LOPRESSOR) 25 MG tablet Take 0.5 tablets (12.5 mg total) by mouth 2 (two) times daily. 90 tablet 3   ondansetron (ZOFRAN) 4 MG tablet Take 1 tablet (4 mg total) by mouth every 8 (eight) hours as needed for nausea or vomiting. 40 tablet 1   pantoprazole (PROTONIX) 40 MG tablet Take 1 tablet (40 mg total) by mouth daily. 30 tablet 3   polyethylene glycol (MIRALAX / GLYCOLAX) 17 g packet Take 17 g by mouth 2 (two) times daily as needed for moderate constipation. 14 each 0   pregabalin (LYRICA) 150 MG capsule Take 1 capsule (150 mg total) by mouth 2 (two) times daily. 180 capsule 1   rosuvastatin (CRESTOR) 10 MG tablet Take 1 tablet (10 mg total) by mouth daily. 90 tablet 3   scopolamine (TRANSDERM-SCOP) 1 MG/3DAYS Place onto the skin.     vitamin C (ASCORBIC ACID) 250 MG tablet Take 1 tablet by mouth every other day.     No current facility-administered  medications on file prior to visit.    ROS: See HPI  Observations/Objective: Awake, alert, oriented x3 Not in acute distress Normal mood  CMP Latest Ref Rng & Units 08/04/2020 05/15/2019 05/15/2019  Glucose 70 - 99 mg/dL 165(H) - 236(H)  BUN 6 - 23 mg/dL 20 - 32(H)  Creatinine 0.40 - 1.20 mg/dL 1.37(H) - 1.33(H)  Sodium 135 - 145 mEq/L 135 - 136  Potassium 3.5 - 5.1 mEq/L 4.8 4.5 5.8(H)  Chloride 96 - 112 mEq/L 101 - 104  CO2 19 - 32 mEq/L 28 - 22  Calcium 8.4 - 10.5 mg/dL 9.6 - 8.8(L)  Total Protein 6.0 - 8.3 g/dL 7.6 - -  Total Bilirubin 0.2 - 1.2 mg/dL 0.5 - -  Alkaline Phos 39 - 117 U/L 91 - -  AST 0 - 37 U/L 23 - -  ALT 0 - 35 U/L 18 - -    Lipid Panel     Component Value Date/Time   CHOL 157 08/04/2020 1116   TRIG 75.0 08/04/2020 1116   HDL 62.20 08/04/2020 1116   CHOLHDL 3 08/04/2020 1116   VLDL 15.0 08/04/2020 1116   LDLCALC 80 08/04/2020 1116    Lab Results  Component Value Date   HGBA1C 8.2 03/09/2021    Assessment and Plan: 1. Other closed fracture of right femur with routine healing, unspecified portion of femur, subsequent encounter Improved Followed by Ortho She is at high risk for fall Will complete FL2 form for placement  2. Acute cystitis without hematuria Will treat based on Clinical symptoms as she is unable to come into the Clinic for a UA - nitrofurantoin, macrocrystal-monohydrate, (MACROBID) 100 MG capsule; Take 1 capsule (100 mg total) by mouth 2 (two) times daily.  Dispense: 10 capsule; Refill: 0  3. Screening for colon cancer - Cologuard  4. Encounter for screening mammogram for malignant neoplasm of breast - MM 3D SCREEN BREAST BILATERAL; Future   Follow Up Instructions: Advised to keep follow up appointments with Endocrine and Ortho   I discussed the assessment and treatment plan with the patient. The patient was provided an opportunity to ask questions and all were answered. The patient agreed with the plan and demonstrated an  understanding of the instructions.   The patient was advised to call back or seek an in-person evaluation if the symptoms worsen or if the condition fails to improve as anticipated.     I provided 12 minutes total of non-face-to-face time during this encounter.   [name], MD, FAAFP. [facility] [place], [place]   04/20/2021, 10:43 AM

## 2021-04-20 NOTE — Progress Notes (Signed)
Needs to complete FL2 form.  Has some burning while urinating, she will come in drop of urine sample.

## 2021-04-21 NOTE — Telephone Encounter (Signed)
Call placed to [facility] # [phone], spoke to [place], left message for Morene Antu, SW to call this CM back and to call patient as well.  Morene Antu was out in the field and will call back as soon as possible

## 2021-04-24 DIAGNOSIS — L89612 Pressure ulcer of right heel, stage 2: Secondary | ICD-10-CM | POA: Diagnosis not present

## 2021-04-24 DIAGNOSIS — K5901 Slow transit constipation: Secondary | ICD-10-CM | POA: Diagnosis not present

## 2021-04-24 DIAGNOSIS — L89622 Pressure ulcer of left heel, stage 2: Secondary | ICD-10-CM | POA: Diagnosis not present

## 2021-04-24 DIAGNOSIS — K3184 Gastroparesis: Secondary | ICD-10-CM | POA: Diagnosis not present

## 2021-04-24 DIAGNOSIS — S82141D Displaced bicondylar fracture of right tibia, subsequent encounter for closed fracture with routine healing: Secondary | ICD-10-CM | POA: Diagnosis not present

## 2021-04-24 DIAGNOSIS — M6701 Short Achilles tendon (acquired), right ankle: Secondary | ICD-10-CM | POA: Diagnosis not present

## 2021-04-24 DIAGNOSIS — E1043 Type 1 diabetes mellitus with diabetic autonomic (poly)neuropathy: Secondary | ICD-10-CM | POA: Diagnosis not present

## 2021-04-24 DIAGNOSIS — E878 Other disorders of electrolyte and fluid balance, not elsewhere classified: Secondary | ICD-10-CM | POA: Diagnosis not present

## 2021-04-24 DIAGNOSIS — E1065 Type 1 diabetes mellitus with hyperglycemia: Secondary | ICD-10-CM | POA: Diagnosis not present

## 2021-04-25 NOTE — Telephone Encounter (Signed)
Call placed to [place] in attempt to speak with Morene Antu, SW.  Spoke to [place] who took message to have Morene Antu return the call to this CM and to also call patient.

## 2021-04-27 DIAGNOSIS — S82141D Displaced bicondylar fracture of right tibia, subsequent encounter for closed fracture with routine healing: Secondary | ICD-10-CM | POA: Diagnosis not present

## 2021-04-27 DIAGNOSIS — K5901 Slow transit constipation: Secondary | ICD-10-CM | POA: Diagnosis not present

## 2021-04-27 DIAGNOSIS — K3184 Gastroparesis: Secondary | ICD-10-CM | POA: Diagnosis not present

## 2021-04-27 DIAGNOSIS — E878 Other disorders of electrolyte and fluid balance, not elsewhere classified: Secondary | ICD-10-CM | POA: Diagnosis not present

## 2021-04-27 DIAGNOSIS — E1065 Type 1 diabetes mellitus with hyperglycemia: Secondary | ICD-10-CM | POA: Diagnosis not present

## 2021-04-27 DIAGNOSIS — M6701 Short Achilles tendon (acquired), right ankle: Secondary | ICD-10-CM | POA: Diagnosis not present

## 2021-04-27 DIAGNOSIS — E1043 Type 1 diabetes mellitus with diabetic autonomic (poly)neuropathy: Secondary | ICD-10-CM | POA: Diagnosis not present

## 2021-04-27 DIAGNOSIS — L89612 Pressure ulcer of right heel, stage 2: Secondary | ICD-10-CM | POA: Diagnosis not present

## 2021-04-27 DIAGNOSIS — L89622 Pressure ulcer of left heel, stage 2: Secondary | ICD-10-CM | POA: Diagnosis not present

## 2021-04-27 NOTE — Telephone Encounter (Signed)
Call placed to patient to inquire if she spoke to Morene Antu, SW. She said that Morene Antu called her Monday 04/24/2021 and told her that she makes too much money from  her disability to qualify for ALF.  Morene Antu also told her that she would call this CM. This CM to try to contact Morene Antu, SW again next week.  The patient said she is okay and she has not made any calls to facilities or [employer].

## 2021-04-28 ENCOUNTER — Telehealth: Payer: Self-pay | Admitting: Family Medicine

## 2021-04-28 ENCOUNTER — Telehealth (INDEPENDENT_AMBULATORY_CARE_PROVIDER_SITE_OTHER): Payer: Self-pay

## 2021-04-28 DIAGNOSIS — L89622 Pressure ulcer of left heel, stage 2: Secondary | ICD-10-CM | POA: Diagnosis not present

## 2021-04-28 DIAGNOSIS — M6701 Short Achilles tendon (acquired), right ankle: Secondary | ICD-10-CM | POA: Diagnosis not present

## 2021-04-28 DIAGNOSIS — E878 Other disorders of electrolyte and fluid balance, not elsewhere classified: Secondary | ICD-10-CM | POA: Diagnosis not present

## 2021-04-28 DIAGNOSIS — E1043 Type 1 diabetes mellitus with diabetic autonomic (poly)neuropathy: Secondary | ICD-10-CM | POA: Diagnosis not present

## 2021-04-28 DIAGNOSIS — K3184 Gastroparesis: Secondary | ICD-10-CM | POA: Diagnosis not present

## 2021-04-28 DIAGNOSIS — K5901 Slow transit constipation: Secondary | ICD-10-CM | POA: Diagnosis not present

## 2021-04-28 DIAGNOSIS — S82141D Displaced bicondylar fracture of right tibia, subsequent encounter for closed fracture with routine healing: Secondary | ICD-10-CM | POA: Diagnosis not present

## 2021-04-28 DIAGNOSIS — L89612 Pressure ulcer of right heel, stage 2: Secondary | ICD-10-CM | POA: Diagnosis not present

## 2021-04-28 DIAGNOSIS — E1065 Type 1 diabetes mellitus with hyperglycemia: Secondary | ICD-10-CM | POA: Diagnosis not present

## 2021-04-28 NOTE — Telephone Encounter (Signed)
Copied from [place] [id number]. Topic: General - Other >> Apr 26, 2021  9:26 AM Leward Quan A wrote: Reason for CRM: Kallie Locks with [facility] called in to speak to Ms [name] in reference to patient can be reached at Ph#  [phone]

## 2021-04-28 NOTE — Telephone Encounter (Signed)
   Yashvi Jasinski DOB: 03/06/1966 MRN: [id number]   RIDER WAIVER AND RELEASE OF LIABILITY  For purposes of improving physical access to our facilities, [facility] is pleased to partner with third parties to provide [facility] patients or other authorized individuals the option of convenient, on-demand ground transportation services (the [place]") through use of the technology service that enables users to request on-demand ground transportation from independent third-party providers.  By opting to use and accept these [employer], I, the undersigned, hereby agree on behalf of myself, and on behalf of any minor child using the Government social research officer for whom I am the parent or legal guardian, as follows:  Government social research officer provided to me are provided by independent third-party transportation providers who are not [employer] or employees and who are unaffiliated with [employer]. [employer] is neither a transportation carrier nor a common or public carrier. [facility] has no control over the quality or safety of the transportation that occurs as a result of the [employer]. [facility] cannot guarantee that any third-party transportation provider will complete any arranged transportation service. [facility] makes no representation, warranty, or guarantee regarding the reliability, timeliness, quality, safety, suitability, or availability of any of the Transport Services or that they will be error free. I fully understand that traveling by vehicle involves risks and dangers of serious bodily injury, including permanent disability, paralysis, and death. I agree, on behalf of myself and on behalf of any minor child using the Transport Services for whom I am the parent or legal guardian, that the entire risk arising out of my use of the [employer] remains solely with me, to the maximum extent permitted under applicable law. The [employer] are provided "as  is" and "as available." [facility] disclaims all representations and warranties, express, implied or statutory, not expressly set out in these terms, including the implied warranties of merchantability and fitness for a particular purpose. I hereby waive and release [facility], its agents, employees, officers, directors, representatives, insurers, attorneys, assigns, successors, subsidiaries, and affiliates from any and all past, present, or future claims, demands, liabilities, actions, causes of action, or suits of any kind directly or indirectly arising from acceptance and use of the [employer]. I further waive and release [facility] and its affiliates from all present and future liability and responsibility for any injury or death to persons or damages to property caused by or related to the use of the [employer]. I have read this Waiver and Release of Liability, and I understand the terms used in it and their legal significance. This Waiver is freely and voluntarily given with the understanding that my right (as well as the right of any minor child for whom I am the parent or legal guardian using the [employer]) to legal recourse against [place] [facility] in connection with the [employer] is knowingly surrendered in return for use of these services.   I attest that I read the consent document to [name], gave Ms. Antwi the opportunity to ask questions and answered the questions asked (if any). I affirm that [name] then provided consent for she's participation in this program.     Legrand Pitts

## 2021-04-28 NOTE — Telephone Encounter (Signed)
Call placed to Catalina Lunger, SW/[facility].    She explained that the patient is currently staying with her sister and the patient needs to find an alternative place to live. The patient's disability income is about $1400/month and that will put her over the limit for qualifying for special assistance Medicaid for ALF.  Her income would qualify her for SNF but the patient is more appropriate for ALF. Explained to her that this CM will contact [employer] of [place] with a general question about qualifying for special assistance with this income and if appropriate, this CM will refer the patient to [facility] for assistance.

## 2021-05-02 ENCOUNTER — Ambulatory Visit: Payer: Medicare HMO | Admitting: Endocrinology

## 2021-05-02 DIAGNOSIS — K5901 Slow transit constipation: Secondary | ICD-10-CM | POA: Diagnosis not present

## 2021-05-02 DIAGNOSIS — E1065 Type 1 diabetes mellitus with hyperglycemia: Secondary | ICD-10-CM | POA: Diagnosis not present

## 2021-05-02 DIAGNOSIS — M6701 Short Achilles tendon (acquired), right ankle: Secondary | ICD-10-CM | POA: Diagnosis not present

## 2021-05-02 DIAGNOSIS — L89622 Pressure ulcer of left heel, stage 2: Secondary | ICD-10-CM | POA: Diagnosis not present

## 2021-05-02 DIAGNOSIS — L89612 Pressure ulcer of right heel, stage 2: Secondary | ICD-10-CM | POA: Diagnosis not present

## 2021-05-02 DIAGNOSIS — K3184 Gastroparesis: Secondary | ICD-10-CM | POA: Diagnosis not present

## 2021-05-02 DIAGNOSIS — E878 Other disorders of electrolyte and fluid balance, not elsewhere classified: Secondary | ICD-10-CM | POA: Diagnosis not present

## 2021-05-02 DIAGNOSIS — S82141D Displaced bicondylar fracture of right tibia, subsequent encounter for closed fracture with routine healing: Secondary | ICD-10-CM | POA: Diagnosis not present

## 2021-05-02 DIAGNOSIS — E1043 Type 1 diabetes mellitus with diabetic autonomic (poly)neuropathy: Secondary | ICD-10-CM | POA: Diagnosis not present

## 2021-05-03 DIAGNOSIS — E1065 Type 1 diabetes mellitus with hyperglycemia: Secondary | ICD-10-CM | POA: Diagnosis not present

## 2021-05-03 DIAGNOSIS — L89622 Pressure ulcer of left heel, stage 2: Secondary | ICD-10-CM | POA: Diagnosis not present

## 2021-05-03 DIAGNOSIS — S82141D Displaced bicondylar fracture of right tibia, subsequent encounter for closed fracture with routine healing: Secondary | ICD-10-CM | POA: Diagnosis not present

## 2021-05-03 DIAGNOSIS — E1043 Type 1 diabetes mellitus with diabetic autonomic (poly)neuropathy: Secondary | ICD-10-CM | POA: Diagnosis not present

## 2021-05-03 DIAGNOSIS — K5901 Slow transit constipation: Secondary | ICD-10-CM | POA: Diagnosis not present

## 2021-05-03 DIAGNOSIS — L89612 Pressure ulcer of right heel, stage 2: Secondary | ICD-10-CM | POA: Diagnosis not present

## 2021-05-03 DIAGNOSIS — M6701 Short Achilles tendon (acquired), right ankle: Secondary | ICD-10-CM | POA: Diagnosis not present

## 2021-05-03 DIAGNOSIS — E878 Other disorders of electrolyte and fluid balance, not elsewhere classified: Secondary | ICD-10-CM | POA: Diagnosis not present

## 2021-05-03 DIAGNOSIS — K3184 Gastroparesis: Secondary | ICD-10-CM | POA: Diagnosis not present

## 2021-05-04 ENCOUNTER — Ambulatory Visit (HOSPITAL_BASED_OUTPATIENT_CLINIC_OR_DEPARTMENT_OTHER): Payer: Medicare HMO | Admitting: Radiology

## 2021-05-10 ENCOUNTER — Telehealth: Payer: Self-pay | Admitting: Family Medicine

## 2021-05-10 DIAGNOSIS — L89622 Pressure ulcer of left heel, stage 2: Secondary | ICD-10-CM | POA: Diagnosis not present

## 2021-05-10 DIAGNOSIS — K3184 Gastroparesis: Secondary | ICD-10-CM | POA: Diagnosis not present

## 2021-05-10 DIAGNOSIS — E1065 Type 1 diabetes mellitus with hyperglycemia: Secondary | ICD-10-CM | POA: Diagnosis not present

## 2021-05-10 DIAGNOSIS — K5901 Slow transit constipation: Secondary | ICD-10-CM | POA: Diagnosis not present

## 2021-05-10 DIAGNOSIS — M6701 Short Achilles tendon (acquired), right ankle: Secondary | ICD-10-CM | POA: Diagnosis not present

## 2021-05-10 DIAGNOSIS — E878 Other disorders of electrolyte and fluid balance, not elsewhere classified: Secondary | ICD-10-CM | POA: Diagnosis not present

## 2021-05-10 DIAGNOSIS — L89612 Pressure ulcer of right heel, stage 2: Secondary | ICD-10-CM | POA: Diagnosis not present

## 2021-05-10 DIAGNOSIS — S82141D Displaced bicondylar fracture of right tibia, subsequent encounter for closed fracture with routine healing: Secondary | ICD-10-CM | POA: Diagnosis not present

## 2021-05-10 DIAGNOSIS — E1043 Type 1 diabetes mellitus with diabetic autonomic (poly)neuropathy: Secondary | ICD-10-CM | POA: Diagnosis not present

## 2021-05-10 NOTE — Telephone Encounter (Signed)
Home Health Verbal Orders - Caller/Agency: jennifer from [facility] Number: [phone] Requesting OT/PT/Skilled Nursing/Social Work/Speech Therapy: PT re-certification  Frequency: 1w9

## 2021-05-10 NOTE — Telephone Encounter (Signed)
Called [place] ok given

## 2021-05-11 NOTE — Telephone Encounter (Signed)
Another message sent to Novella Olive, attorney/[facility] inquiring if patient might be eligible for special assistance Medicaid

## 2021-05-12 DIAGNOSIS — E1065 Type 1 diabetes mellitus with hyperglycemia: Secondary | ICD-10-CM | POA: Diagnosis not present

## 2021-05-12 DIAGNOSIS — M6701 Short Achilles tendon (acquired), right ankle: Secondary | ICD-10-CM | POA: Diagnosis not present

## 2021-05-12 DIAGNOSIS — L89622 Pressure ulcer of left heel, stage 2: Secondary | ICD-10-CM | POA: Diagnosis not present

## 2021-05-12 DIAGNOSIS — K3184 Gastroparesis: Secondary | ICD-10-CM | POA: Diagnosis not present

## 2021-05-12 DIAGNOSIS — K5901 Slow transit constipation: Secondary | ICD-10-CM | POA: Diagnosis not present

## 2021-05-12 DIAGNOSIS — S82141D Displaced bicondylar fracture of right tibia, subsequent encounter for closed fracture with routine healing: Secondary | ICD-10-CM | POA: Diagnosis not present

## 2021-05-12 DIAGNOSIS — E1043 Type 1 diabetes mellitus with diabetic autonomic (poly)neuropathy: Secondary | ICD-10-CM | POA: Diagnosis not present

## 2021-05-12 DIAGNOSIS — L89612 Pressure ulcer of right heel, stage 2: Secondary | ICD-10-CM | POA: Diagnosis not present

## 2021-05-12 DIAGNOSIS — E878 Other disorders of electrolyte and fluid balance, not elsewhere classified: Secondary | ICD-10-CM | POA: Diagnosis not present

## 2021-05-12 NOTE — Telephone Encounter (Signed)
Message received from Novella Olive, [place] requesting a referral be made and she will have their Medicaid team review the case. Attempted to contact patient [phone] to inform her of above and obtain authorization to refer her to [facility]. Voicemail full ,unable to leave a message

## 2021-05-14 DIAGNOSIS — K5901 Slow transit constipation: Secondary | ICD-10-CM | POA: Diagnosis not present

## 2021-05-14 DIAGNOSIS — K3184 Gastroparesis: Secondary | ICD-10-CM | POA: Diagnosis not present

## 2021-05-14 DIAGNOSIS — L89612 Pressure ulcer of right heel, stage 2: Secondary | ICD-10-CM | POA: Diagnosis not present

## 2021-05-14 DIAGNOSIS — L89622 Pressure ulcer of left heel, stage 2: Secondary | ICD-10-CM | POA: Diagnosis not present

## 2021-05-14 DIAGNOSIS — E1043 Type 1 diabetes mellitus with diabetic autonomic (poly)neuropathy: Secondary | ICD-10-CM | POA: Diagnosis not present

## 2021-05-14 DIAGNOSIS — S82141D Displaced bicondylar fracture of right tibia, subsequent encounter for closed fracture with routine healing: Secondary | ICD-10-CM | POA: Diagnosis not present

## 2021-05-14 DIAGNOSIS — M6701 Short Achilles tendon (acquired), right ankle: Secondary | ICD-10-CM | POA: Diagnosis not present

## 2021-05-14 DIAGNOSIS — E878 Other disorders of electrolyte and fluid balance, not elsewhere classified: Secondary | ICD-10-CM | POA: Diagnosis not present

## 2021-05-14 DIAGNOSIS — E1065 Type 1 diabetes mellitus with hyperglycemia: Secondary | ICD-10-CM | POA: Diagnosis not present

## 2021-05-15 NOTE — Telephone Encounter (Signed)
Call placed to patient and explained that Legal Aid of [facility]) may be able to provide more information for her regarding her income and qualification for special assistance Medicaid for ALF.  This CM explained that a referral to [facility] does not mean that she is committing to a facility it will just provide her with more information in the event she would like to go to ALF. She said she will need to think about it and get back to this CM if she would like the [facility] referral.  She is concerned about sharing  a room at an ALF and realizes that there is a lot to consider when making a decision about transferring to a facility from living independently.

## 2021-05-16 NOTE — Telephone Encounter (Signed)
Call placed to [name], SW/[facility] and informed her of conversation with patient yesterday. At this time, patient is not interested in referral to [facility] and needs to think more about a possible facility placement.

## 2021-05-25 DIAGNOSIS — L89622 Pressure ulcer of left heel, stage 2: Secondary | ICD-10-CM | POA: Diagnosis not present

## 2021-05-25 DIAGNOSIS — E1043 Type 1 diabetes mellitus with diabetic autonomic (poly)neuropathy: Secondary | ICD-10-CM | POA: Diagnosis not present

## 2021-05-25 DIAGNOSIS — K5901 Slow transit constipation: Secondary | ICD-10-CM | POA: Diagnosis not present

## 2021-05-25 DIAGNOSIS — M6701 Short Achilles tendon (acquired), right ankle: Secondary | ICD-10-CM | POA: Diagnosis not present

## 2021-05-25 DIAGNOSIS — E1065 Type 1 diabetes mellitus with hyperglycemia: Secondary | ICD-10-CM | POA: Diagnosis not present

## 2021-05-25 DIAGNOSIS — L89612 Pressure ulcer of right heel, stage 2: Secondary | ICD-10-CM | POA: Diagnosis not present

## 2021-05-25 DIAGNOSIS — K3184 Gastroparesis: Secondary | ICD-10-CM | POA: Diagnosis not present

## 2021-05-25 DIAGNOSIS — E878 Other disorders of electrolyte and fluid balance, not elsewhere classified: Secondary | ICD-10-CM | POA: Diagnosis not present

## 2021-05-25 DIAGNOSIS — S82141D Displaced bicondylar fracture of right tibia, subsequent encounter for closed fracture with routine healing: Secondary | ICD-10-CM | POA: Diagnosis not present

## 2021-05-31 ENCOUNTER — Ambulatory Visit: Payer: Medicare HMO

## 2021-05-31 DIAGNOSIS — K3184 Gastroparesis: Secondary | ICD-10-CM | POA: Diagnosis not present

## 2021-05-31 DIAGNOSIS — S82141D Displaced bicondylar fracture of right tibia, subsequent encounter for closed fracture with routine healing: Secondary | ICD-10-CM | POA: Diagnosis not present

## 2021-05-31 DIAGNOSIS — E1065 Type 1 diabetes mellitus with hyperglycemia: Secondary | ICD-10-CM | POA: Diagnosis not present

## 2021-05-31 DIAGNOSIS — K5901 Slow transit constipation: Secondary | ICD-10-CM | POA: Diagnosis not present

## 2021-05-31 DIAGNOSIS — M6701 Short Achilles tendon (acquired), right ankle: Secondary | ICD-10-CM | POA: Diagnosis not present

## 2021-05-31 DIAGNOSIS — L89622 Pressure ulcer of left heel, stage 2: Secondary | ICD-10-CM | POA: Diagnosis not present

## 2021-05-31 DIAGNOSIS — L89612 Pressure ulcer of right heel, stage 2: Secondary | ICD-10-CM | POA: Diagnosis not present

## 2021-05-31 DIAGNOSIS — E878 Other disorders of electrolyte and fluid balance, not elsewhere classified: Secondary | ICD-10-CM | POA: Diagnosis not present

## 2021-05-31 DIAGNOSIS — E1043 Type 1 diabetes mellitus with diabetic autonomic (poly)neuropathy: Secondary | ICD-10-CM | POA: Diagnosis not present

## 2021-06-08 DIAGNOSIS — K3184 Gastroparesis: Secondary | ICD-10-CM | POA: Diagnosis not present

## 2021-06-08 DIAGNOSIS — K5901 Slow transit constipation: Secondary | ICD-10-CM | POA: Diagnosis not present

## 2021-06-08 DIAGNOSIS — M6701 Short Achilles tendon (acquired), right ankle: Secondary | ICD-10-CM | POA: Diagnosis not present

## 2021-06-08 DIAGNOSIS — E878 Other disorders of electrolyte and fluid balance, not elsewhere classified: Secondary | ICD-10-CM | POA: Diagnosis not present

## 2021-06-08 DIAGNOSIS — L89612 Pressure ulcer of right heel, stage 2: Secondary | ICD-10-CM | POA: Diagnosis not present

## 2021-06-08 DIAGNOSIS — E1043 Type 1 diabetes mellitus with diabetic autonomic (poly)neuropathy: Secondary | ICD-10-CM | POA: Diagnosis not present

## 2021-06-08 DIAGNOSIS — L89622 Pressure ulcer of left heel, stage 2: Secondary | ICD-10-CM | POA: Diagnosis not present

## 2021-06-08 DIAGNOSIS — E1065 Type 1 diabetes mellitus with hyperglycemia: Secondary | ICD-10-CM | POA: Diagnosis not present

## 2021-06-08 DIAGNOSIS — S82141D Displaced bicondylar fracture of right tibia, subsequent encounter for closed fracture with routine healing: Secondary | ICD-10-CM | POA: Diagnosis not present

## 2021-06-13 DIAGNOSIS — E878 Other disorders of electrolyte and fluid balance, not elsewhere classified: Secondary | ICD-10-CM | POA: Diagnosis not present

## 2021-06-13 DIAGNOSIS — E1043 Type 1 diabetes mellitus with diabetic autonomic (poly)neuropathy: Secondary | ICD-10-CM | POA: Diagnosis not present

## 2021-06-13 DIAGNOSIS — K5901 Slow transit constipation: Secondary | ICD-10-CM | POA: Diagnosis not present

## 2021-06-13 DIAGNOSIS — L89622 Pressure ulcer of left heel, stage 2: Secondary | ICD-10-CM | POA: Diagnosis not present

## 2021-06-13 DIAGNOSIS — L89612 Pressure ulcer of right heel, stage 2: Secondary | ICD-10-CM | POA: Diagnosis not present

## 2021-06-13 DIAGNOSIS — S82141D Displaced bicondylar fracture of right tibia, subsequent encounter for closed fracture with routine healing: Secondary | ICD-10-CM | POA: Diagnosis not present

## 2021-06-13 DIAGNOSIS — E1065 Type 1 diabetes mellitus with hyperglycemia: Secondary | ICD-10-CM | POA: Diagnosis not present

## 2021-06-13 DIAGNOSIS — K3184 Gastroparesis: Secondary | ICD-10-CM | POA: Diagnosis not present

## 2021-06-13 DIAGNOSIS — M6701 Short Achilles tendon (acquired), right ankle: Secondary | ICD-10-CM | POA: Diagnosis not present

## 2021-06-21 DIAGNOSIS — L89612 Pressure ulcer of right heel, stage 2: Secondary | ICD-10-CM | POA: Diagnosis not present

## 2021-06-21 DIAGNOSIS — L89622 Pressure ulcer of left heel, stage 2: Secondary | ICD-10-CM | POA: Diagnosis not present

## 2021-06-21 DIAGNOSIS — E1065 Type 1 diabetes mellitus with hyperglycemia: Secondary | ICD-10-CM | POA: Diagnosis not present

## 2021-06-21 DIAGNOSIS — K5901 Slow transit constipation: Secondary | ICD-10-CM | POA: Diagnosis not present

## 2021-06-21 DIAGNOSIS — K3184 Gastroparesis: Secondary | ICD-10-CM | POA: Diagnosis not present

## 2021-06-21 DIAGNOSIS — E878 Other disorders of electrolyte and fluid balance, not elsewhere classified: Secondary | ICD-10-CM | POA: Diagnosis not present

## 2021-06-21 DIAGNOSIS — E1043 Type 1 diabetes mellitus with diabetic autonomic (poly)neuropathy: Secondary | ICD-10-CM | POA: Diagnosis not present

## 2021-06-21 DIAGNOSIS — S82141D Displaced bicondylar fracture of right tibia, subsequent encounter for closed fracture with routine healing: Secondary | ICD-10-CM | POA: Diagnosis not present

## 2021-06-21 DIAGNOSIS — M6701 Short Achilles tendon (acquired), right ankle: Secondary | ICD-10-CM | POA: Diagnosis not present

## 2021-07-04 DIAGNOSIS — L89622 Pressure ulcer of left heel, stage 2: Secondary | ICD-10-CM | POA: Diagnosis not present

## 2021-07-04 DIAGNOSIS — E878 Other disorders of electrolyte and fluid balance, not elsewhere classified: Secondary | ICD-10-CM | POA: Diagnosis not present

## 2021-07-04 DIAGNOSIS — E1043 Type 1 diabetes mellitus with diabetic autonomic (poly)neuropathy: Secondary | ICD-10-CM | POA: Diagnosis not present

## 2021-07-04 DIAGNOSIS — K3184 Gastroparesis: Secondary | ICD-10-CM | POA: Diagnosis not present

## 2021-07-04 DIAGNOSIS — K5901 Slow transit constipation: Secondary | ICD-10-CM | POA: Diagnosis not present

## 2021-07-04 DIAGNOSIS — S82141D Displaced bicondylar fracture of right tibia, subsequent encounter for closed fracture with routine healing: Secondary | ICD-10-CM | POA: Diagnosis not present

## 2021-07-04 DIAGNOSIS — M6701 Short Achilles tendon (acquired), right ankle: Secondary | ICD-10-CM | POA: Diagnosis not present

## 2021-07-04 DIAGNOSIS — E1065 Type 1 diabetes mellitus with hyperglycemia: Secondary | ICD-10-CM | POA: Diagnosis not present

## 2021-07-04 DIAGNOSIS — L89612 Pressure ulcer of right heel, stage 2: Secondary | ICD-10-CM | POA: Diagnosis not present

## 2021-07-12 DIAGNOSIS — E878 Other disorders of electrolyte and fluid balance, not elsewhere classified: Secondary | ICD-10-CM | POA: Diagnosis not present

## 2021-07-12 DIAGNOSIS — E1043 Type 1 diabetes mellitus with diabetic autonomic (poly)neuropathy: Secondary | ICD-10-CM | POA: Diagnosis not present

## 2021-07-12 DIAGNOSIS — M6701 Short Achilles tendon (acquired), right ankle: Secondary | ICD-10-CM | POA: Diagnosis not present

## 2021-07-12 DIAGNOSIS — K5901 Slow transit constipation: Secondary | ICD-10-CM | POA: Diagnosis not present

## 2021-07-12 DIAGNOSIS — L89612 Pressure ulcer of right heel, stage 2: Secondary | ICD-10-CM | POA: Diagnosis not present

## 2021-07-12 DIAGNOSIS — K3184 Gastroparesis: Secondary | ICD-10-CM | POA: Diagnosis not present

## 2021-07-12 DIAGNOSIS — S82141D Displaced bicondylar fracture of right tibia, subsequent encounter for closed fracture with routine healing: Secondary | ICD-10-CM | POA: Diagnosis not present

## 2021-07-12 DIAGNOSIS — L89622 Pressure ulcer of left heel, stage 2: Secondary | ICD-10-CM | POA: Diagnosis not present

## 2021-07-12 DIAGNOSIS — E1065 Type 1 diabetes mellitus with hyperglycemia: Secondary | ICD-10-CM | POA: Diagnosis not present

## 2021-07-19 ENCOUNTER — Telehealth: Payer: Self-pay | Admitting: Endocrinology

## 2021-07-19 DIAGNOSIS — E1065 Type 1 diabetes mellitus with hyperglycemia: Secondary | ICD-10-CM

## 2021-07-19 NOTE — Telephone Encounter (Signed)
Pt is calling in stating that she is needing a refill on Rx insulin glargine (LANTUS) 100 Unit/ML and insulin aspart (NOVOLOG) 100 ML pt stated that is it hard for her to get here and she is handicap.   Pharm:  [facility] in [place], [place]

## 2021-07-20 NOTE — Telephone Encounter (Signed)
Called and spoke to patient. She has not used Pump because she not has pods in a while. I informed her that we can send a prescription for pods. She is ok with that. Medications are pending because I'm not sure if you want her on lantus if she goes back to pods.

## 2021-07-21 MED ORDER — OMNIPOD DASH PODS (GEN 4) MISC: 0 refills | Status: DC

## 2021-07-21 MED ORDER — INSULIN ASPART 100 UNIT/ML IJ SOLN: INTRAMUSCULAR | 99 refills | Status: DC

## 2021-07-21 NOTE — Telephone Encounter (Signed)
Pt is calling in stating that her medication was sent to the incorrect pharmacy and would like to see if it can be called in today.  Pharm:  [facility] in [place] on [address]

## 2021-07-24 MED ORDER — INSULIN GLARGINE 100 UNIT/ML ~~LOC~~ SOLN: SUBCUTANEOUS | 3 refills | Status: DC

## 2021-07-24 NOTE — Addendum Note (Signed)
Addended by: Sarina Ill on: 07/24/2021 10:06 AM   Modules accepted: Orders

## 2021-07-24 NOTE — Telephone Encounter (Signed)
Lantus has now been sent to patient's preferred pharmacy.

## 2021-07-25 NOTE — Telephone Encounter (Signed)
Pt is calling in stating that she would like to have the two Rx's insulin aspart (NOVOLOG) 100 Unit and Insulin Disposable Pump (OMNIPOD DASH POST, GEN 4) resent to the correct pharmacy.  Pharm:  [facility] in [place] (on [address]).  Pt stated that she is almost out.  Pt would like to have a call back when sent to the pharmacy.

## 2021-07-27 ENCOUNTER — Other Ambulatory Visit: Payer: Self-pay

## 2021-07-27 DIAGNOSIS — E1065 Type 1 diabetes mellitus with hyperglycemia: Secondary | ICD-10-CM

## 2021-07-27 MED ORDER — INSULIN ASPART 100 UNIT/ML IJ SOLN
INTRAMUSCULAR | 99 refills | Status: DC
Start: 2021-07-27 — End: 2022-07-04

## 2021-07-27 MED ORDER — OMNIPOD DASH PODS (GEN 4) MISC: 0 refills | Status: DC

## 2021-07-27 NOTE — Telephone Encounter (Signed)
Rx Sent to [facility]

## 2021-08-09 ENCOUNTER — Other Ambulatory Visit: Payer: Self-pay

## 2021-08-09 ENCOUNTER — Ambulatory Visit (INDEPENDENT_AMBULATORY_CARE_PROVIDER_SITE_OTHER): Payer: Medicare HMO | Admitting: Endocrinology

## 2021-08-09 ENCOUNTER — Encounter: Payer: Self-pay | Admitting: Endocrinology

## 2021-08-09 VITALS — BP 120/62 | HR 68 | Ht 61.0 in | Wt 177.8 lb

## 2021-08-09 DIAGNOSIS — E1065 Type 1 diabetes mellitus with hyperglycemia: Secondary | ICD-10-CM

## 2021-08-09 DIAGNOSIS — E7800 Pure hypercholesterolemia, unspecified: Secondary | ICD-10-CM | POA: Diagnosis not present

## 2021-08-09 DIAGNOSIS — E1040 Type 1 diabetes mellitus with diabetic neuropathy, unspecified: Secondary | ICD-10-CM | POA: Diagnosis not present

## 2021-08-09 DIAGNOSIS — R6889 Other general symptoms and signs: Secondary | ICD-10-CM | POA: Diagnosis not present

## 2021-08-09 LAB — COMPREHENSIVE METABOLIC PANEL
ALT: 13 U/L (ref 0–35)
AST: 19 U/L (ref 0–37)
Albumin: 4.3 g/dL (ref 3.5–5.2)
Alkaline Phosphatase: 114 U/L (ref 39–117)
BUN: 31 mg/dL — ABNORMAL HIGH (ref 6–23)
CO2: 27 mEq/L (ref 19–32)
Calcium: 9.9 mg/dL (ref 8.4–10.5)
Chloride: 103 mEq/L (ref 96–112)
Creatinine, Ser: 1.33 mg/dL — ABNORMAL HIGH (ref 0.40–1.20)
GFR: 45.45 mL/min — ABNORMAL LOW (ref >60.00)
Glucose, Bld: 163 mg/dL — ABNORMAL HIGH (ref 70–99)
Potassium: 5.2 mEq/L — ABNORMAL HIGH (ref 3.5–5.1)
Sodium: 138 mEq/L (ref 135–145)
Total Bilirubin: 0.7 mg/dL (ref 0.2–1.2)
Total Protein: 8.9 g/dL — ABNORMAL HIGH (ref 6.0–8.3)

## 2021-08-09 LAB — LIPID PANEL
Cholesterol: 183 mg/dL (ref 0–200)
HDL: 57.5 mg/dL (ref >39.00)
LDL Cholesterol: 105 mg/dL — ABNORMAL HIGH (ref 0–99)
NonHDL: 125.67
Total CHOL/HDL Ratio: 3
Triglycerides: 104 mg/dL (ref 0.0–149.0)
VLDL: 20.8 mg/dL (ref 0.0–40.0)

## 2021-08-09 LAB — POCT GLYCOSYLATED HEMOGLOBIN (HGB A1C): Hemoglobin A1C: 10 % — AB (ref 4.0–5.6)

## 2021-08-09 MED ORDER — OMNIPOD 5 DEXG7G6 PODS GEN 5 MISC: 1.0000 | 3 refills | Status: DC

## 2021-08-09 MED ORDER — METOCLOPRAMIDE HCL 10 MG PO TABS: ORAL_TABLET | ORAL | 1 refills | Status: DC

## 2021-08-09 MED ORDER — OMNIPOD 5 DEXG7G6 INTRO GEN 5 KIT: 1.0000 | PACK | Freq: Once | 0 refills | Status: AC

## 2021-08-09 NOTE — Progress Notes (Signed)
Patient ID: [name], female   DOB: January 05, 1966, 56 y.o.   MRN: [id number]           Reason for Appointment : Follow-up for Type 1 Diabetes  History of Present Illness          Diagnosis: Type 1 diabetes mellitus, date of diagnosis: 1977         Previous history:  She has mostly been followed by primary care physicians for her diabetes with consistently poor control She has had periodic episodes of ketoacidosis Previous insulin regimen:  BASAL insulin: LANTUS 15 units in a.m. MEALTIME insulin: Novolog variable doses: Usually 6-8 at breakfast, 2-3 at lunch and dinner   Previously using OmniPod insulin pump since 07/07/2019  Basal rates: Midnight-5 AM = 0 . 75 units, 10 AM = 1.0, 10:30 AM = 1.2, and 4 PM = 0.75  I/C ratio 1:1;   ISF: 35, target:120 with correction over 120  Recent history:   A1c is 10, previously 8.2  She has not been using the insulin pump since she has been hospitalized and in rehab  Current management, blood sugar patterns and problems identified:   She has not been seen in follow-up since 9/22  Because of cost issues she could not afford the pump last year and only started back in the last week  However her basal settings are quite different than what she was told to program on the last visit However her pump is programmed to 2:30 AM instead of 11:30 AM and she does not know how to change this  She is mostly taking boluses of 2 to 4 units for meals but blood sugars on her sensor appear to be fairly persistently high except on a couple of nights in the last week when they were improved Overall overnight blood sugars are better compared to when she was on Lantus and NovoLog  She does not appear to have improved blood sugars when she tries to do any correction boluses with blood sugars mostly staying in the 200+ range when she is monitoring during the day  She appears to have only 1 or 2 meal boluses the day except for yesterday and occasional correction  boluses with overall bolus insulin 36% of the total insulin usage of about 16 units a day She appears to be gaining weight recently   Interpretation of the recent Dexcom download is as follows  Since the data is relevant within the last week only with her insulin pump being active the summary of the patterns is as follows HYPERGLYCEMIA appears to be consistently occurring between noon and almost midnight with the peak blood sugars around 3 PM and then gradually declining. Overnight blood sugars are highly variable with on 2 nights the blood sugars are near normal or low normal but on the other nights they are close to 180 or sometimes higher and significant bike on one of the nights around 3-4 AM POSTPRANDIAL readings are difficult to assess because mealtimes being variable but she mostly has higher readings starting at noon and averaging nearly 250 at about 3 PM No hypoglycemia with low normal readings only once before noon Premeal blood sugars are variable in the mornings, mostly significantly high early afternoon and also around 200+ around dinnertime  Data for the last 2 weeks  CGM use % of time 86  2-week average/SD 231/76  Time in range    27    %  % Time Above 180 32  % Time above 250  40  % Time Below 70 0       Symptoms of hypoglycemia: Weakness, sometimes none Treatment of hypoglycemia: Snacks         Self-care: The diet that the patient has been following is: None  Mealtimes are: Breakfast egg, cheese, sausage, occasionally grits, toast;   Lunch: Sandwich or only snacks, dinner: baked chricken, rice, potatoes                Dietician consultation: Most recent: years ago.         CDE consultation: 04/2018  Wt Readings from Last 3 Encounters:  08/09/21 177 lb 12.8 oz (80.6 kg)  08/04/20 160 lb 12.8 oz (72.9 kg)  06/13/20 168 lb 9.6 oz (76.5 kg)   Diabetes labs:  Lab Results  Component Value Date   HGBA1C 10.0 (A) 08/09/2021   HGBA1C 8.2 03/09/2021   HGBA1C 9.0  (A) 08/04/2020   Lab Results  Component Value Date   MICROALBUR 3.7 (H) 06/13/2020   LDLCALC 80 08/04/2020   CREATININE 1.37 (H) 08/04/2020    Lab Results  Component Value Date   MICRALBCREAT 4.3 06/13/2020   MICRALBCREAT 17.1 04/28/2018     Allergies as of 08/09/2021       Reactions   Anesthetics, Amide Nausea And Vomiting   Chlorhexidine    Penicillins Diarrhea, Nausea And Vomiting, Other (See Comments)   Has patient had a PCN reaction causing immediate rash, facial/tongue/throat swelling, SOB or lightheadedness with hypotension: Yes Has patient had a PCN reaction causing severe rash involving mucus membranes or skin necrosis: No Has patient had a PCN reaction that required hospitalization No Has patient had a PCN reaction occurring within the last 10 years: Yes  If all of the above answers are "NO", then may proceed with Cephalosporin use.   Buprenorphine Hcl Rash   Encainide Nausea And Vomiting   Metoclopramide Other (See Comments)   Currently no side effects, previously reportedly had dystonia, muscle rigidity, slurred speech, tongue swelling        Medication List        Accurate as of August 09, 2021 11:19 AM. If you have any questions, ask your nurse or doctor.          aspirin EC 81 MG tablet Take 1 tablet (81 mg total) by mouth daily.   busPIRone 10 MG tablet Commonly known as: BUSPAR Take 1 tablet (10 mg total) by mouth 3 (three) times daily.   Contour Next Test test strip Generic drug: glucose blood TEST FOUR TIMES DAILY   docusate sodium 100 MG capsule Commonly known as: Colace Take 1 capsule (100 mg total) by mouth daily as needed for mild constipation.   dronabinol 5 MG capsule Commonly known as: MARINOL Take 1 capsule by mouth 2 (two) times daily before a meal.   furosemide 40 MG tablet Commonly known as: LASIX Take 1 tablet (40 mg total) by mouth daily.   insulin aspart 100 UNIT/ML injection Commonly known as: novoLOG Inject 100  Units into pump   insulin glargine 100 UNIT/ML injection Commonly known as: LANTUS Inject 12 units at breakfast and supper subcutaneously.   Insulin Syringe-Needle U-100 31G X 5/16" 0.5 ML Misc Commonly known as: BD Insulin Syringe Ultrafine 1 each by Does not apply route 4 (four) times daily.   Iron 325 (65 Fe) MG Tabs Take 1 tablet by mouth every other day.   loperamide 2 MG capsule Commonly known as: IMODIUM Take 1 capsule (2 mg total) by  mouth 3 (three) times daily as needed for diarrhea or loose stools.   losartan 25 MG tablet Commonly known as: COZAAR Take 1 tablet (25 mg total) by mouth daily.   metoCLOPramide 10 MG tablet Commonly known as: REGLAN TAKE 1 TABLET(10 MG) BY MOUTH THREE TIMES DAILY BEFORE MEALS   metoprolol tartrate 25 MG tablet Commonly known as: LOPRESSOR Take 0.5 tablets (12.5 mg total) by mouth 2 (two) times daily.   nitrofurantoin (macrocrystal-monohydrate) 100 MG capsule Commonly known as: Macrobid Take 1 capsule (100 mg total) by mouth 2 (two) times daily.   Omnipod DASH PDM (Gen 4) Kit by Does not apply route.   Omnipod DASH Pods (Gen 4) Misc CHANGE DASH PODS EVERY 48 HOURS AS DIRECTED   ondansetron 4 MG tablet Commonly known as: Zofran Take 1 tablet (4 mg total) by mouth every 8 (eight) hours as needed for nausea or vomiting.   pantoprazole 40 MG tablet Commonly known as: PROTONIX Take 1 tablet (40 mg total) by mouth daily.   polyethylene glycol 17 g packet Commonly known as: MIRALAX / GLYCOLAX Take 17 g by mouth 2 (two) times daily as needed for moderate constipation.   pregabalin 150 MG capsule Commonly known as: Lyrica Take 1 capsule (150 mg total) by mouth 2 (two) times daily.   rosuvastatin 10 MG tablet Commonly known as: CRESTOR Take 1 tablet (10 mg total) by mouth daily.   scopolamine 1 MG/3DAYS Commonly known as: TRANSDERM-SCOP Place onto the skin.   vitamin C 250 MG tablet Commonly known as: ASCORBIC ACID Take 1  tablet by mouth every other day.        Allergies:  Allergies  Allergen Reactions   Anesthetics, Amide Nausea And Vomiting   Chlorhexidine    Penicillins Diarrhea, Nausea And Vomiting and Other (See Comments)    Has patient had a PCN reaction causing immediate rash, facial/tongue/throat swelling, SOB or lightheadedness with hypotension: Yes Has patient had a PCN reaction causing severe rash involving mucus membranes or skin necrosis: No Has patient had a PCN reaction that required hospitalization No Has patient had a PCN reaction occurring within the last 10 years: Yes  If all of the above answers are "NO", then may proceed with Cephalosporin use.    Buprenorphine Hcl Rash   Encainide Nausea And Vomiting   Metoclopramide Other (See Comments)    Currently no side effects, previously reportedly had dystonia, muscle rigidity, slurred speech, tongue swelling    Past Medical History:  Diagnosis Date   AKI (acute kidney injury) ([place])    Anemia, iron deficiency    Anxiety and depression 05/18/2015   CAD (coronary artery disease), native coronary artery 01/11/2018   s/p CABG - Non-STEMI with severe three-vessel disease noted July 2019   Depression    Diabetic gastroparesis ([place])    Diabetic neuropathy, type I diabetes mellitus ([place]) 05/18/2015   DKA, type 1 ([place]) 03/18/2016   Essential hypertension    Gastroparesis    GERD (gastroesophageal reflux disease)    HLD (hyperlipidemia)    MDD (major depressive disorder), single episode, severe , no psychosis ([place])    Tardive dyskinesia    Vitamin B12 deficiency 08/16/2015    Past Surgical History:  Procedure Laterality Date   CARDIAC SURGERY     COLONOSCOPY  09/27/2014   at [facility] N/A 01/14/2018   Procedure: CORONARY ARTERY BYPASS GRAFTING (CABG)X3, RIGHT AND LEFT SAPHENOUS VEIN HARVEST, MAMMARY TAKE DOWN. MAMMARY TO LAD, SVG TO  PD, SVG TO DISTAL CIRC.;  Surgeon: Grace Isaac, MD;  Location: [place];   Service: Open Heart Surgery;  Laterality: N/A;   ESOPHAGOGASTRODUODENOSCOPY  09/27/2014   at [facility], Dr Rolan Lipa. biospy neg for celiac, neg for H pylori.    EYE SURGERY     gailstones     INTRAMEDULLARY (IM) NAIL INTERTROCHANTERIC Right 05/10/2019   Procedure: INTRAMEDULLARY (IM) NAIL INTERTROCHANTRIC;  Surgeon: Lovell Sheehan, MD;  Location: [facility];  Service: Orthopedics;  Laterality: Right;   IR FLUORO GUIDE CV LINE RIGHT  02/01/2017   IR FLUORO GUIDE CV LINE RIGHT  03/06/2017   IR FLUORO GUIDE CV LINE RIGHT  03/25/2017   IR GENERIC HISTORICAL  01/24/2016   IR FLUORO GUIDE CV LINE RIGHT 01/24/2016 Darrell K Allred, PA-C [facility]   IR GENERIC HISTORICAL  01/24/2016   IR US GUIDE VASC ACCESS RIGHT 01/24/2016 Darrell K Allred, PA-C [facility]   IR US GUIDE VASC ACCESS RIGHT  02/01/2017   IR US GUIDE VASC ACCESS RIGHT  03/06/2017   IR US GUIDE VASC ACCESS RIGHT  03/25/2017   IR US GUIDE VASC ACCESS RIGHT  01/20/2019   IR VENIPUNCTURE 28YRS/OLDER BY MD  01/20/2019   LEFT HEART CATH AND CORONARY ANGIOGRAPHY N/A 01/07/2018   Procedure: LEFT HEART CATH AND CORONARY ANGIOGRAPHY;  Surgeon: Troy Sine, MD;  Location: [place] [facility];  Service: Cardiovascular;  Laterality: N/A;   POSTERIOR VITRECTOMY AND MEMBRANE PEEL-LEFT EYE  09/28/2002   POSTERIOR VITRECTOMY AND MEMBRANE PEEL-RIGHT EYE  03/16/2002   RETINAL DETACHMENT SURGERY     TEE WITHOUT CARDIOVERSION N/A 01/14/2018   Procedure: TRANSESOPHAGEAL ECHOCARDIOGRAM (TEE);  Surgeon: Grace Isaac, MD;  Location: [place];  Service: Open Heart Surgery;  Laterality: N/A;    Family History  Problem Relation Age of Onset   Cystic fibrosis Mother    Hypertension Father    Diabetes Brother    Hypertension Maternal Grandmother     Social History:  reports that she has never smoked. She has never used smokeless tobacco. She reports current drug use. Drug: Marijuana. She reports that she does not drink alcohol.      Review of Systems       Lipids: She had been taking Crestor 10 mg prescribed by cardiologist No recent labs available  Lab Results  Component Value Date   CHOL 157 08/04/2020   HDL 62.20 08/04/2020   [place] 80 08/04/2020   TRIG 75.0 08/04/2020   CHOLHDL 3 08/04/2020     DIABETES COMPLICATIONS: Gastroparesis treated with Zofran as needed Also on Reglan with fairly good relief of symptoms Needs new prescription   On Lyrica 150 mg for peripheral neuropathy with pain generally relieved by the Lyrica   Physical Examination:  BP 120/62    Pulse 68    Ht $R'5\' 1"'gB$  (1.549 m)    Wt 177 lb 12.8 oz (80.6 kg)    SpO2 99%    BMI 33.60 kg/m       ASSESSMENT:  Diabetes type 1, long-standing with multiple complications  She is currently just starting back on her OmniPod pump  See history of present illness for detailed discussion of current diabetes management, blood sugar patterns and problems identified  Her A1c is currently 10%  Blood sugars from her Dexcom were reviewed Her main difficulty is persistent hyperglycemia Partly this is related to inadequate insulin in the basal form during the daytime with her pump time being programmed about 9  hours slow Also does not appear to be getting enough correction when she has high readings She does take some correction boluses but not consistently Even with the pump her overnight blood sugars are quite variable but mostly high   Neuropathy: Treated with Lyrica with fairly good control of symptoms  Persistent gastroparesis symptoms: New prescription for Reglan to take before meals  PLAN:   Discussed bolusing before every single meal and any carbohydrate intake Make correction boluses for any persistently high readings The pump time was programmed correctly today Correction threshold was increased to 140 to potentially avoid hypoglycemia Since she is still appears to be getting high postprandial readings he will add at least 1 to 2 units more to her usual  boluses  Discussed the potential benefit of using the OmniPod 5 pump with the closed-loop system and will send prescription to see if this is covered Discussed the differences with the closed-loop system She was advised to let us know if this is approved and will train her accordingly  To continue Lyrica and Reglan as above  Labs to be checked for microalbumin and lipids also today  There are no Patient Instructions on file for this visit.    Elayne Snare 08/09/2021, 11:19 AM     Note: This note was prepared with Dragon voice recognition system technology. Any transcriptional errors that result from this process are unintentional.

## 2021-08-09 NOTE — Patient Instructions (Addendum)
[facility]: call

## 2021-08-15 ENCOUNTER — Encounter: Payer: Self-pay | Admitting: Endocrinology

## 2021-08-15 MED ORDER — ROSUVASTATIN CALCIUM 10 MG PO TABS: 10.0000 mg | ORAL_TABLET | Freq: Every day | ORAL | 3 refills | Status: DC

## 2021-08-15 NOTE — Addendum Note (Signed)
Addended by: Cinda Quest on: 08/15/2021 02:30 PM   Modules accepted: Orders

## 2021-08-15 NOTE — Progress Notes (Signed)
Cholesterol is too high, need to know if she is taking her rosuvastatin 10 mg

## 2021-08-16 ENCOUNTER — Ambulatory Visit: Payer: Medicare HMO | Admitting: Podiatry

## 2021-08-16 ENCOUNTER — Other Ambulatory Visit: Payer: Self-pay

## 2021-08-16 DIAGNOSIS — M79674 Pain in right toe(s): Secondary | ICD-10-CM | POA: Diagnosis not present

## 2021-08-16 DIAGNOSIS — B351 Tinea unguium: Secondary | ICD-10-CM | POA: Diagnosis not present

## 2021-08-16 DIAGNOSIS — R234 Changes in skin texture: Secondary | ICD-10-CM | POA: Diagnosis not present

## 2021-08-16 DIAGNOSIS — M79675 Pain in left toe(s): Secondary | ICD-10-CM

## 2021-08-16 DIAGNOSIS — R6889 Other general symptoms and signs: Secondary | ICD-10-CM | POA: Diagnosis not present

## 2021-08-17 ENCOUNTER — Telehealth: Payer: Self-pay | Admitting: Podiatry

## 2021-08-17 NOTE — Telephone Encounter (Signed)
Pt called stating she thought she was scheduled to see the diabetic foot specialist yesterday but seen Dr Amalia Hailey and I explained he does treat diabetics. I explained that we were trying to get her in asap and that the doctors that see only the diabetic foot care were booked out  so that is why we got her in with Dr Amalia Hailey. She is scheduled to see Dr Elisha Ponder for her next visit. Pt is asking for a prescription medication to put on her heels that are open wounds. She did get the cream in the office but feels that she needed some type of rx instead. She uses [facility] on [address] in [place]. Pt aware Dr Rebekah Chesterfield is in surgery today so she may not hear back today.

## 2021-08-17 NOTE — Telephone Encounter (Signed)
Patient is requesting prescription for her heels(wounds)received a cream in office but needs something called in.

## 2021-08-21 ENCOUNTER — Other Ambulatory Visit: Payer: Self-pay | Admitting: Podiatry

## 2021-08-21 MED ORDER — GENTAMICIN SULFATE 0.1 % EX CREA: 1.0000 "application " | TOPICAL_CREAM | Freq: Two times a day (BID) | CUTANEOUS | 1 refills | Status: DC

## 2021-08-21 NOTE — Progress Notes (Signed)
PRN heel wounds

## 2021-08-21 NOTE — Telephone Encounter (Signed)
Prescription for gentamicin cream sent to the pharmacy. Ammie will you please notify patient. Thanks, Dr. Amalia Hailey

## 2021-08-22 NOTE — Telephone Encounter (Signed)
Patient notified

## 2021-08-22 NOTE — Progress Notes (Signed)
° °  SUBJECTIVE Patient with a history of diabetes mellitus presents to office today complaining of elongated, thickened nails that cause pain while ambulating in shoes.  Patient is unable to trim their own nails.  Patient also complains of cracking in her heels and very dry skin.  She says it becomes very painful.  She presents for further treatment and evaluation patient is here for further evaluation and treatment.   Past Medical History:  Diagnosis Date   AKI (acute kidney injury) ([place])    Anemia, iron deficiency    Anxiety and depression 05/18/2015   CAD (coronary artery disease), native coronary artery 01/11/2018   s/p CABG - Non-STEMI with severe three-vessel disease noted July 2019   Depression    Diabetic gastroparesis ([place])    Diabetic neuropathy, type I diabetes mellitus ([place]) 05/18/2015   DKA, type 1 ([employer]) 03/18/2016   Essential hypertension    Gastroparesis    GERD (gastroesophageal reflux disease)    HLD (hyperlipidemia)    MDD (major depressive disorder), single episode, severe , no psychosis ([place])    Tardive dyskinesia    Vitamin B12 deficiency 08/16/2015    OBJECTIVE General Patient is awake, alert, and oriented x 3 and in no acute distress. Derm Skin is dry and supple bilateral. Negative open lesions or macerations. Remaining integument unremarkable. Nails are tender, long, thickened and dystrophic with subungual debris, consistent with onychomycosis, 1-5 bilateral. No signs of infection noted.  Hyperkeratotic skin with fissuring noted to the posterior heels bilateral Vasc  DP and PT pedal pulses palpable bilaterally. Temperature gradient within normal limits.  Neuro Epicritic and protective threshold sensation diminished bilaterally.  Musculoskeletal Exam No symptomatic pedal deformities noted bilateral. Muscular strength within normal limits.  ASSESSMENT 1. Diabetes Mellitus w/ peripheral neuropathy 2.  Pain due to onychomycosis of toenails bilateral 3.  Fissuring  of skin bilateral heels  PLAN OF CARE 1. Patient evaluated today. 2. Instructed to maintain good pedal hygiene and foot care. Stressed importance of controlling blood sugar.  3. Mechanical debridement of nails 1-5 bilaterally performed using a nail nipper. Filed with dremel without incident.  4.  In regard to the heel fissures, recommend triple antibiotic ointment and daily foot lotion to hydrate the feet and moisturize the skin.   5.  Advised against going barefoot or wearing sandals that dry out the skin  6.  Return to clinic in 3 mos.     Edrick Kins, DPM [facility]  Dr. Edrick Kins, DPM    [address], [place] [address]                Office [phone]  Fax [phone]

## 2021-08-28 ENCOUNTER — Other Ambulatory Visit: Payer: Self-pay

## 2021-08-28 ENCOUNTER — Ambulatory Visit
Admission: EM | Admit: 2021-08-28 | Discharge: 2021-08-28 | Disposition: A | Discharge status: Home / Self Care | Payer: Medicare HMO | Attending: Emergency Medicine | Admitting: Emergency Medicine

## 2021-08-28 ENCOUNTER — Encounter: Payer: Self-pay | Admitting: Emergency Medicine

## 2021-08-28 ENCOUNTER — Ambulatory Visit: Payer: Self-pay | Admitting: *Deleted

## 2021-08-28 ENCOUNTER — Telehealth: Payer: Self-pay | Admitting: *Deleted

## 2021-08-28 DIAGNOSIS — R300 Dysuria: Secondary | ICD-10-CM | POA: Insufficient documentation

## 2021-08-28 DIAGNOSIS — N390 Urinary tract infection, site not specified: Secondary | ICD-10-CM

## 2021-08-28 LAB — POCT URINALYSIS DIP (MANUAL ENTRY)
Bilirubin, UA: NEGATIVE
Glucose, UA: 100 mg/dL — AB
Nitrite, UA: POSITIVE — AB
Protein Ur, POC: 30 mg/dL — AB
Spec Grav, UA: 1.02 (ref 1.010–1.025)
Urobilinogen, UA: 0.2 E.U./dL
pH, UA: 5.5 (ref 5.0–8.0)

## 2021-08-28 MED ORDER — SULFAMETHOXAZOLE-TRIMETHOPRIM 800-160 MG PO TABS: 1.0000 | ORAL_TABLET | Freq: Two times a day (BID) | ORAL | 0 refills | Status: AC

## 2021-08-28 NOTE — ED Triage Notes (Signed)
Pt reports lower abd, cloudy urine and foul odor with urine since Friday.  ?

## 2021-08-28 NOTE — ED Provider Notes (Signed)
[facility]    CSN: [id number] Arrival date & time: 08/28/21  1514    HISTORY   Chief Complaint  Patient presents with   Abdominal Pain   HPI [name] is a 56 y.o. female. Patient complains of lower abdominal pain, cloudy urine and foul smelling urine for the past 3 days.  Patient denies fever, aches, chills, nausea, vomiting, diarrhea, burning with urination, increased frequency of urination, increased urge to urinate, incomplete emptying flank pain.  The history is provided by the patient.  Past Medical History:  Diagnosis Date   AKI (acute kidney injury) ([place])    Anemia, iron deficiency    Anxiety and depression 05/18/2015   CAD (coronary artery disease), native coronary artery 01/11/2018   s/p CABG - Non-STEMI with severe three-vessel disease noted July 2019   Depression    Diabetic gastroparesis ([place])    Diabetic neuropathy, type I diabetes mellitus ([place]) 05/18/2015   DKA, type 1 ([place]) 03/18/2016   Essential hypertension    Gastroparesis    GERD (gastroesophageal reflux disease)    HLD (hyperlipidemia)    MDD (major depressive disorder), single episode, severe , no psychosis ([place])    Tardive dyskinesia    Vitamin B12 deficiency 08/16/2015   Patient Active Problem List   Diagnosis Date Noted   Closed fracture of proximal end of femur, right, initial encounter ([place]) 05/09/2019   CAD (coronary artery disease) 03/06/2019   Acute encephalopathy 12/22/2018   Diabetic ketoacidosis ([place]) 04/30/2018   Gastroparesis 04/13/2018   DKA (diabetic ketoacidosis) ([place]) 03/31/2018   S/P CABG x 3 01/14/2018   CAD (coronary artery disease), native coronary artery 01/11/2018   Dehydration    MDD (major depressive disorder), single episode, severe , no psychosis ([place])    DKA (diabetic ketoacidoses) 06/16/2017   Tardive dyskinesia    Type 1 diabetes mellitus with hyperlipidemia ([place]) 03/18/2016   Noncompliance with treatment plan 03/13/2016   Essential hypertension  01/24/2016   HLD (hyperlipidemia)    GERD (gastroesophageal reflux disease)    AKI (acute kidney injury) ([place])    Anemia, iron deficiency    Vitamin B12 deficiency 08/16/2015   Diabetic gastroparesis ([facility])    Diabetic neuropathy, type I diabetes mellitus ([place]) 05/18/2015   Anxiety and depression 05/18/2015   Past Surgical History:  Procedure Laterality Date   CARDIAC SURGERY     COLONOSCOPY  09/27/2014   at [facility] N/A 01/14/2018   Procedure: CORONARY ARTERY BYPASS GRAFTING (CABG)X3, RIGHT AND LEFT SAPHENOUS VEIN HARVEST, MAMMARY TAKE DOWN. MAMMARY TO LAD, SVG TO PD, SVG TO DISTAL CIRC.;  Surgeon: Grace Isaac, MD;  Location: [facility];  Service: Open Heart Surgery;  Laterality: N/A;   ESOPHAGOGASTRODUODENOSCOPY  09/27/2014   at [facility], Dr Rolan Lipa. biospy neg for celiac, neg for H pylori.    EYE SURGERY     gailstones     INTRAMEDULLARY (IM) NAIL INTERTROCHANTERIC Right 05/10/2019   Procedure: INTRAMEDULLARY (IM) NAIL INTERTROCHANTRIC;  Surgeon: Lovell Sheehan, MD;  Location: [facility];  Service: Orthopedics;  Laterality: Right;   IR FLUORO GUIDE CV LINE RIGHT  02/01/2017   IR FLUORO GUIDE CV LINE RIGHT  03/06/2017   IR FLUORO GUIDE CV LINE RIGHT  03/25/2017   IR GENERIC HISTORICAL  01/24/2016   IR FLUORO GUIDE CV LINE RIGHT 01/24/2016 Darrell K Allred, PA-C [facility]   IR GENERIC HISTORICAL  01/24/2016   IR US GUIDE VASC ACCESS RIGHT 01/24/2016 Shirlyn Goltz  Allred, PA-C [facility]   IR US GUIDE VASC ACCESS RIGHT  02/01/2017   IR US GUIDE VASC ACCESS RIGHT  03/06/2017   IR US GUIDE VASC ACCESS RIGHT  03/25/2017   IR US GUIDE VASC ACCESS RIGHT  01/20/2019   IR VENIPUNCTURE 65YRS/OLDER BY MD  01/20/2019   LEFT HEART CATH AND CORONARY ANGIOGRAPHY N/A 01/07/2018   Procedure: LEFT HEART CATH AND CORONARY ANGIOGRAPHY;  Surgeon: Troy Sine, MD;  Location: [facility];  Service: Cardiovascular;  Laterality: N/A;   POSTERIOR VITRECTOMY AND MEMBRANE  PEEL-LEFT EYE  09/28/2002   POSTERIOR VITRECTOMY AND MEMBRANE PEEL-RIGHT EYE  03/16/2002   RETINAL DETACHMENT SURGERY     TEE WITHOUT CARDIOVERSION N/A 01/14/2018   Procedure: TRANSESOPHAGEAL ECHOCARDIOGRAM (TEE);  Surgeon: Grace Isaac, MD;  Location: [facility];  Service: Open Heart Surgery;  Laterality: N/A;   OB History     Gravida  2   Para      Term      Preterm      AB  1   Living  1      SAB      IAB  1   Ectopic      Multiple      Live Births             Home Medications    Prior to Admission medications   Medication Sig Start Date End Date Taking? Authorizing Provider  aspirin EC 81 MG tablet Take 1 tablet (81 mg total) by mouth daily. 06/03/18   Nahser, Wonda Cheng, MD  busPIRone (BUSPAR) 10 MG tablet Take 1 tablet (10 mg total) by mouth 3 (three) times daily. 11/03/19   [name], MD  furosemide (LASIX) 40 MG tablet Take 1 tablet (40 mg total) by mouth daily. 07/29/20   Nahser, Wonda Cheng, MD  gentamicin cream (GARAMYCIN) 0.1 % Apply 1 application topically 2 (two) times daily. 08/21/21   Edrick Kins, DPM  glucose blood (CONTOUR NEXT TEST) test strip TEST FOUR TIMES DAILY 02/20/21   Elayne Snare, MD  insulin aspart (NOVOLOG) 100 UNIT/ML injection Inject 100 Units into pump 07/27/21   Elayne Snare, MD  Insulin Disposable Pump (OMNIPOD 5 G6 POD, GEN 5,) MISC 1 Device by Does not apply route every 3 (three) days. 08/09/21   Elayne Snare, MD  Insulin Disposable Pump (OMNIPOD DASH PODS, GEN 4,) MISC CHANGE DASH PODS EVERY 48 HOURS AS DIRECTED 07/27/21   Elayne Snare, MD  Insulin Disposable Pump (OMNIPOD DASH SYSTEM) KIT by Does not apply route.    [provider]  Insulin Syringe-Needle U-100 (BD INSULIN SYRINGE ULTRAFINE) 31G X 5/16" 0.5 ML MISC 1 each by Does not apply route 4 (four) times daily. 03/20/21   Elayne Snare, MD  losartan (COZAAR) 25 MG tablet Take 1 tablet (25 mg total) by mouth daily. 07/29/20   Nahser, Wonda Cheng, MD  metoCLOPramide (REGLAN) 10 MG  tablet TAKE 1 TABLET(10 MG) BY MOUTH THREE TIMES DAILY BEFORE MEALS 08/09/21   Elayne Snare, MD  metoprolol tartrate (LOPRESSOR) 25 MG tablet Take 0.5 tablets (12.5 mg total) by mouth 2 (two) times daily. 07/29/20   Nahser, Wonda Cheng, MD  pregabalin (LYRICA) 150 MG capsule Take 1 capsule (150 mg total) by mouth 2 (two) times daily. 03/22/21   Elayne Snare, MD  rosuvastatin (CRESTOR) 10 MG tablet Take 1 tablet (10 mg total) by mouth daily. 08/15/21   Elayne Snare, MD   Family History Family History  Problem Relation Age of Onset   Cystic fibrosis Mother    Hypertension Father    Diabetes Brother    Hypertension Maternal Grandmother    Social History Social History   Tobacco Use   Smoking status: Never   Smokeless tobacco: Never  Vaping Use   Vaping Use: Never used  Substance Use Topics   Alcohol use: No   Drug use: Yes    Types: Marijuana    Comment: occ   Allergies   Anesthetics, amide; Chlorhexidine; Penicillins; Buprenorphine hcl; Encainide; and Metoclopramide  Review of Systems Review of Systems Pertinent findings noted in history of present illness.   Physical Exam Triage Vital Signs ED Triage Vitals  Enc Vitals Group     BP 04/21/21 0827 (!) 147/82     Pulse Rate 04/21/21 0827 72     Resp 04/21/21 0827 18     Temp 04/21/21 0827 98.3 F (36.8 C)     Temp Source 04/21/21 0827 Oral     SpO2 04/21/21 0827 98 %     Weight --      Height --      Head Circumference --      Peak Flow --      Pain Score 04/21/21 0826 5     Pain Loc --      Pain Edu? --      Excl. in [place]? --   No data found.  Updated Vital Signs BP (!) 145/71 (BP Location: Left Arm)    Pulse 74    Temp 97.9 F (36.6 C) (Oral)    Resp 20    SpO2 97%   Physical Exam Vitals and nursing note reviewed.  Constitutional:      General: She is not in acute distress.    Appearance: Normal appearance. She is not ill-appearing.  HENT:     Head: Normocephalic and atraumatic.  Eyes:     General: Lids are normal.         Right eye: No discharge.        Left eye: No discharge.     Extraocular Movements: Extraocular movements intact.     Conjunctiva/sclera: Conjunctivae normal.     Right eye: Right conjunctiva is not injected.     Left eye: Left conjunctiva is not injected.  Neck:     Trachea: Trachea and phonation normal.  Cardiovascular:     Rate and Rhythm: Normal rate and regular rhythm.     Pulses: Normal pulses.     Heart sounds: Normal heart sounds. No murmur heard.   No friction rub. No gallop.  Pulmonary:     Effort: Pulmonary effort is normal. No accessory muscle usage, prolonged expiration or respiratory distress.     Breath sounds: Normal breath sounds. No stridor, decreased air movement or transmitted upper airway sounds. No decreased breath sounds, wheezing, rhonchi or rales.  Chest:     Chest wall: No tenderness.  Abdominal:     General: Abdomen is flat. Bowel sounds are normal. There is no distension.     Palpations: Abdomen is soft.     Tenderness: There is abdominal tenderness in the suprapubic area. There is no right CVA tenderness or left CVA tenderness.     Hernia: No hernia is present.  Musculoskeletal:        General: Normal range of motion.     Cervical back: Normal range of motion and neck supple. Normal range of motion.  Lymphadenopathy:     Cervical: No  cervical adenopathy.  Skin:    General: Skin is warm and dry.     Findings: No erythema or rash.  Neurological:     General: No focal deficit present.     Mental Status: She is alert and oriented to person, place, and time.  Psychiatric:        Mood and Affect: Mood normal.        Behavior: Behavior normal.    Visual Acuity Right Eye Distance:   Left Eye Distance:   Bilateral Distance:    Right Eye Near:   Left Eye Near:    Bilateral Near:     UC Couse / Diagnostics / Procedures:    EKG  Radiology No results found.  Procedures Procedures (including critical care time)  UC Diagnoses / Final  Clinical Impressions(s)   I have reviewed the triage vital signs and the nursing notes.  Pertinent labs & imaging results that were available during my care of the patient were reviewed by me and considered in my medical decision making (see chart for details).    Final diagnoses:  Dysuria  Acute lower urinary tract infection     Patient was advised to begin antibiotics now due to findings on urine dip. Patient was advised to begin antibiotics today due to having active symptoms of urinary tract infection.                    Patient was advised to take all doses exactly as prescribed.  Patient also advised of risks of worsening infection with incomplete antibiotic therapy. Patient advised that they will be contacted with results and that adjustments to treatment will be provided as indicated based on the results.   Patient was advised of possibility that urine culture results may be negative if sample provided was obtained late in the day causing urine to be more diluted.  Patient was advised that if antibiotics were effective after the first 24 to 36 hours, despite negative urine culture result, it is recommended that they complete the full course as prescribed.   Return precautions advised.  ED Prescriptions     Medication Sig Dispense Auth. Provider   sulfamethoxazole-trimethoprim (BACTRIM DS) 800-160 MG tablet Take 1 tablet by mouth 2 (two) times daily for 5 days. 10 tablet [name] Scales, PA-C      PDMP not reviewed this encounter.  Pending results:  Labs Reviewed  POCT URINALYSIS DIP (MANUAL ENTRY) - Abnormal; Notable for the following components:      Result Value   Color, UA straw (*)    Clarity, UA cloudy (*)    Glucose, UA =100 (*)    Ketones, POC UA small (15) (*)    Blood, UA trace-lysed (*)    Protein Ur, POC =30 (*)    Nitrite, UA Positive (*)    Leukocytes, UA Small (1+) (*)    All other components within normal limits  URINE CULTURE    Medications  Ordered in UC: Medications - No data to display  Disposition Upon Discharge:  Condition: stable for discharge home  Patient presented with concern for an acute illness with associated systemic symptoms and significant discomfort requiring urgent management. In my opinion, this is a condition that a prudent lay person (someone who possesses an average knowledge of health and medicine) may potentially expect to result in complications if not addressed urgently such as respiratory distress, impairment of bodily function or dysfunction of bodily organs.   As such, the patient  has been evaluated and assessed, work-up was performed and treatment was provided in alignment with urgent care protocols and evidence based medicine.  Patient/parent/caregiver has been advised that the patient may require follow up for further testing and/or treatment if the symptoms continue in spite of treatment, as clinically indicated and appropriate.  Routine symptom specific, illness specific and/or disease specific instructions were discussed with the patient and/or caregiver at length.  Prevention strategies for avoiding STD exposure were also discussed.  The patient will follow up with their current PCP if and as advised. If the patient does not currently have a PCP we will assist them in obtaining one.   The patient may need specialty follow up if the symptoms continue, in spite of conservative treatment and management, for further workup, evaluation, consultation and treatment as clinically indicated and appropriate.  Patient/parent/caregiver verbalized understanding and agreement of plan as discussed.  All questions were addressed during visit.  Please see discharge instructions below for further details of plan.  Discharge Instructions:   Discharge Instructions      The urinalysis that we performed in the clinic today was abnormal.  Urine culture will be performed per our protocol.  The result of the urine  culture will be available in the next 3 to 5 days and will be posted to your MyChart account.  If there is an abnormal finding, you will be contacted by phone and advised of further treatment recommendations, if any.   You were advised to begin antibiotics today because you are having active symptoms of a urinary tract infection.  It is very important that you take all doses exactly as prescribed.  Incomplete antibiotic therapy can cause worsening urinary tract infection that can become aggressive, reach the level of your kidneys causing kidney infection and possible hospitalization.   If you have not had complete resolution of your symptoms after completing treatment as prescribed, please return to urgent care for repeat evaluation or follow-up with your primary care provider.   Thank you for visiting urgent care today.  I appreciate the opportunity to participate in your care.     This office note has been dictated using Museum/gallery curator.  Unfortunately, and despite my best efforts, this method of dictation can sometimes lead to occasional typographical or grammatical errors.  I apologize in advance if this occurs.      [name] Scales, PA-C 08/28/21 1555

## 2021-08-28 NOTE — Telephone Encounter (Signed)
Patient has [place] transportation scheduled to transport to UC today.  ? ?Needed an apt for DME supply.  ?Scheduled apt with PCP. ?

## 2021-08-28 NOTE — Telephone Encounter (Signed)
?  Chief Complaint: UTI, frequency, odor, cloudy urine.    ?Symptoms: above  ?Frequency: Yes of urination ?Pertinent Negatives: Patient denies burning with urination ?Disposition: '[]'$ ED /'[x]'$ Urgent Care (no appt availability in office) / '[]'$ Appointment(In office/virtual)/ '[]'$  [facility]/ '[]'$ Home Care/ '[]'$ Refused Recommended Disposition /'[]'$ [facility]/ '[]'$  Follow-up with PCP ?Additional Notes: There are no appts available with [facility].   I offered her the virtual appt but she does not use that technology.   She also has to arrange for transportation 3 days at least in advance.   I let her know I would put her on the wait list as well as send a note to [facility] and see if she can be worked in.   Pt given the [employer] number.   She is going to see if they will take her to the urgent care.    [facility] also offered however they are in [facility] on [address]. And said that would not work for her.   They are not running on Friday.    I added her to the wait list. ?

## 2021-08-28 NOTE — Telephone Encounter (Signed)
I returned pt's call.   C/o cloudy urine with an odor, frequency.  Requesting an appt.  ?Also needs a letter for shower handrail to be put into new apartment. ? ? ?Reason for Disposition ? Urinating more frequently than usual (i.e., frequency) ? ?Answer Assessment - Initial Assessment Questions ?1. SYMPTOM: "What's the main symptom you're concerned about?" (e.g., frequency, incontinence) ?    Odor, cloudy urine, frequency.    ?Dr. Margarita Rana tried to send me to a urologist.   They refused to see me because I had a past due bill.   I didn't have the money.   I never followed up with Dr. Margarita Rana.  I get these often. ?2. ONSET: "When did the  symptoms  start?" ?    Started Friday ?3. PAIN: "Is there any pain?" If Yes, ask: "How bad is it?" (Scale: 1-10; mild, moderate, severe) ?    No burning ?4. CAUSE: "What do you think is causing the symptoms?" ?    UTI ?5. OTHER SYMPTOMS: "Do you have any other symptoms?" (e.g., fever, flank pain, blood in urine, pain with urination) ?    above ?6. PREGNANCY: "Is there any chance you are pregnant?" "When was your last menstrual period?" ?    N/A ? ?Protocols used: Urinary Symptoms-A-AH ? ?

## 2021-08-28 NOTE — Discharge Instructions (Addendum)
The urinalysis that we performed in the clinic today was abnormal.  Urine culture will be performed per our protocol.  The result of the urine culture will be available in the next 3 to 5 days and will be posted to your MyChart account.  If there is an abnormal finding, you will be contacted by phone and advised of further treatment recommendations, if any. ?  ?You were advised to begin antibiotics today because you are having active symptoms of a urinary tract infection.  It is very important that you take all doses exactly as prescribed.  Incomplete antibiotic therapy can cause worsening urinary tract infection that can become aggressive, reach the level of your kidneys causing kidney infection and possible hospitalization. ?  ?If you have not had complete resolution of your symptoms after completing treatment as prescribed, please return to urgent care for repeat evaluation or follow-up with your primary care provider. ?  ?Thank you for visiting urgent care today.  I appreciate the opportunity to participate in your care.  ?

## 2021-08-30 LAB — URINE CULTURE: Culture: NO GROWTH

## 2021-09-06 ENCOUNTER — Encounter: Payer: Self-pay | Admitting: Family Medicine

## 2021-09-06 ENCOUNTER — Other Ambulatory Visit: Payer: Self-pay

## 2021-09-06 ENCOUNTER — Ambulatory Visit: Payer: Medicare HMO | Attending: Family Medicine | Admitting: Family Medicine

## 2021-09-06 VITALS — BP 132/72 | HR 68 | Ht 61.0 in | Wt 174.0 lb

## 2021-09-06 DIAGNOSIS — Z1211 Encounter for screening for malignant neoplasm of colon: Secondary | ICD-10-CM | POA: Diagnosis not present

## 2021-09-06 DIAGNOSIS — I2510 Atherosclerotic heart disease of native coronary artery without angina pectoris: Secondary | ICD-10-CM

## 2021-09-06 DIAGNOSIS — E1065 Type 1 diabetes mellitus with hyperglycemia: Secondary | ICD-10-CM

## 2021-09-06 DIAGNOSIS — E785 Hyperlipidemia, unspecified: Secondary | ICD-10-CM

## 2021-09-06 DIAGNOSIS — Z9181 History of falling: Secondary | ICD-10-CM | POA: Diagnosis not present

## 2021-09-06 DIAGNOSIS — E669 Obesity, unspecified: Secondary | ICD-10-CM | POA: Diagnosis not present

## 2021-09-06 DIAGNOSIS — E1165 Type 2 diabetes mellitus with hyperglycemia: Secondary | ICD-10-CM

## 2021-09-06 DIAGNOSIS — E1169 Type 2 diabetes mellitus with other specified complication: Secondary | ICD-10-CM | POA: Diagnosis not present

## 2021-09-06 DIAGNOSIS — K3184 Gastroparesis: Secondary | ICD-10-CM | POA: Diagnosis not present

## 2021-09-06 NOTE — Progress Notes (Signed)
Needs letter stating that she needs hand rails in her bathroom. ? ?Requesting more PT. ?

## 2021-09-06 NOTE — Progress Notes (Signed)
? ?Subjective:  ?Patient ID: [name], female    DOB: 11-05-1965  Age: 56 y.o. MRN: [id number] ? ?CC: No chief complaint on file. ? ? ?HPI ?[name] is a 56 y.o. year old female with a history of uncontrolled type 1 diabetes mellitus (A1c 10.0 managed by endocrine), diabetic neuropathy, diabetic gastroparesis, hypertension, anemia, anxiety and depression, multiple ED visits and hospitalizations for diabetic gastroparesis, CAD s/p CABG x3 (LIMA to LAD, SVG to PDA, SVG to OM) in 12/2017, R femoral fracture (s/p intramedullary nail in 04/2019). ? ?Interval History: ?She presents today because she requires a letter stating she needs a handrail  in her bath tub as she moved to a new apartment which does not have handrails in the bathroom. ?She is high risk for falls as she has a R foot drop and ambulates with a walker. She has a brace but does not have anyone at home to help her put it on. ?She would like a referral for outpatient PT ? ? ?Currently under the care of Endocrine for management of her diabetes with her last visit in 07/2021.  Not up-to-date on her annual eye exam. ?Her gastroparesis is doing much better on Regllan which she states she is no longer allergic to even though it appears on her allergy list. Her allergy included slurred speech and her tongue edema but she states in the last year she has had had a problem with this. ? ?She has not seen Cardiology since last year. She has no chest pain. ?She would like to work on weight loss but unfortunately is unable to exercise due to the fact that she uses a walker. ?Past Medical History:  ?Diagnosis Date  ? AKI (acute kidney injury) ([place])   ? Anemia, iron deficiency   ? Anxiety and depression 05/18/2015  ? CAD (coronary artery disease), native coronary artery 01/11/2018  ? s/p CABG - Non-STEMI with severe three-vessel disease noted July 2019  ? Depression   ? Diabetic gastroparesis ([place])   ? Diabetic neuropathy, type I diabetes mellitus ([place]) 05/18/2015   ? DKA, type 1 ([place]) 03/18/2016  ? Essential hypertension   ? Gastroparesis   ? GERD (gastroesophageal reflux disease)   ? HLD (hyperlipidemia)   ? MDD (major depressive disorder), single episode, severe , no psychosis ([place])   ? Tardive dyskinesia   ? Vitamin B12 deficiency 08/16/2015  ? ? ?Past Surgical History:  ?Procedure Laterality Date  ? CARDIAC SURGERY    ? COLONOSCOPY  09/27/2014  ? at [facility]  ? CORONARY ARTERY BYPASS GRAFT N/A 01/14/2018  ? Procedure: CORONARY ARTERY BYPASS GRAFTING (CABG)X3, RIGHT AND LEFT SAPHENOUS VEIN HARVEST, MAMMARY TAKE DOWN. MAMMARY TO LAD, SVG TO PD, SVG TO DISTAL CIRC.;  Surgeon: Grace Isaac, MD;  Location: [place];  Service: Open Heart Surgery;  Laterality: N/A;  ? ESOPHAGOGASTRODUODENOSCOPY  09/27/2014  ? at [facility], Dr Rolan Lipa. biospy neg for celiac, neg for H pylori.   ? EYE SURGERY    ? gailstones    ? INTRAMEDULLARY (IM) NAIL INTERTROCHANTERIC Right 05/10/2019  ? Procedure: INTRAMEDULLARY (IM) NAIL INTERTROCHANTRIC;  Surgeon: Lovell Sheehan, MD;  Location: [facility];  Service: Orthopedics;  Laterality: Right;  ? IR FLUORO GUIDE CV LINE RIGHT  02/01/2017  ? IR FLUORO GUIDE CV LINE RIGHT  03/06/2017  ? IR FLUORO GUIDE CV LINE RIGHT  03/25/2017  ? IR GENERIC HISTORICAL  01/24/2016  ? IR FLUORO GUIDE CV LINE RIGHT 01/24/2016 Darrell K Allred, PA-C [facility]  ?  IR GENERIC HISTORICAL  01/24/2016  ? IR US GUIDE VASC ACCESS RIGHT 01/24/2016 Darrell K Allred, PA-C [facility]  ? IR US GUIDE VASC ACCESS RIGHT  02/01/2017  ? IR US GUIDE VASC ACCESS RIGHT  03/06/2017  ? IR US GUIDE VASC ACCESS RIGHT  03/25/2017  ? IR US GUIDE VASC ACCESS RIGHT  01/20/2019  ? IR VENIPUNCTURE 45YRS/OLDER BY MD  01/20/2019  ? LEFT HEART CATH AND CORONARY ANGIOGRAPHY N/A 01/07/2018  ? Procedure: LEFT HEART CATH AND CORONARY ANGIOGRAPHY;  Surgeon: Troy Sine, MD;  Location: [place] [facility];  Service: Cardiovascular;  Laterality: N/A;  ? POSTERIOR VITRECTOMY AND MEMBRANE PEEL-LEFT EYE  09/28/2002  ? POSTERIOR  VITRECTOMY AND MEMBRANE PEEL-RIGHT EYE  03/16/2002  ? RETINAL DETACHMENT SURGERY    ? TEE WITHOUT CARDIOVERSION N/A 01/14/2018  ? Procedure: TRANSESOPHAGEAL ECHOCARDIOGRAM (TEE);  Surgeon: Grace Isaac, MD;  Location: [facility];  Service: Open Heart Surgery;  Laterality: N/A;  ? ? ?Family History  ?Problem Relation Age of Onset  ? Cystic fibrosis Mother   ? Hypertension Father   ? Diabetes Brother   ? Hypertension Maternal Grandmother   ? ? ?Social History  ? ?Socioeconomic History  ? Marital status: Single  ?  Spouse name: Not on file  ? Number of children: 1  ? Years of education: Not on file  ? Highest education level: Not on file  ?Occupational History  ? Occupation: Unemployed  ?Tobacco Use  ? Smoking status: Never  ? Smokeless tobacco: Never  ?Vaping Use  ? Vaping Use: Never used  ?Substance and Sexual Activity  ? Alcohol use: No  ? Drug use: Yes  ?  Types: Marijuana  ?  Comment: occ  ? Sexual activity: Yes  ?  Partners: Male  ?  Birth control/protection: None  ?Other Topics Concern  ? Not on file  ?Social History Narrative  ? Not on file  ? ?Social Determinants of Health  ? ?Financial Resource Strain: Not on file  ?Food Insecurity: Not on file  ?Transportation Needs: Not on file  ?Physical Activity: Not on file  ?Stress: Not on file  ?Social Connections: Not on file  ? ? ?Allergies  ?Allergen Reactions  ? Anesthetics, Amide Nausea And Vomiting  ? Chlorhexidine   ? Penicillins Diarrhea, Nausea And Vomiting and Other (See Comments)  ?  Has patient had a PCN reaction causing immediate rash, facial/tongue/throat swelling, SOB or lightheadedness with hypotension: Yes ?Has patient had a PCN reaction causing severe rash involving mucus membranes or skin necrosis: No ?Has patient had a PCN reaction that required hospitalization No ?Has patient had a PCN reaction occurring within the last 10 years: Yes  ?If all of the above answers are "NO", then may proceed with Cephalosporin use. ?  ? Buprenorphine Hcl Rash  ?  Encainide Nausea And Vomiting  ? Metoclopramide Other (See Comments)  ?  Currently no side effects, previously reportedly had dystonia, muscle rigidity, slurred speech, tongue swelling  ? ? ?Outpatient Medications Prior to Visit  ?Medication Sig Dispense Refill  ? aspirin EC 81 MG tablet Take 1 tablet (81 mg total) by mouth daily.    ? busPIRone (BUSPAR) 10 MG tablet Take 1 tablet (10 mg total) by mouth 3 (three) times daily. 90 tablet 3  ? furosemide (LASIX) 40 MG tablet Take 1 tablet (40 mg total) by mouth daily. 90 tablet 3  ? gentamicin cream (GARAMYCIN) 0.1 % Apply 1 application topically 2 (two) times daily. [place]  g 1  ? glucose blood (CONTOUR NEXT TEST) test strip TEST FOUR TIMES DAILY 150 strip 0  ? insulin aspart (NOVOLOG) 100 UNIT/ML injection Inject 100 Units into pump 10 mL PRN  ? Insulin Disposable Pump (OMNIPOD 5 G6 POD, GEN 5,) MISC 1 Device by Does not apply route every 3 (three) days. 2 each 3  ? Insulin Disposable Pump (OMNIPOD DASH PODS, GEN 4,) MISC CHANGE DASH PODS EVERY 48 HOURS AS DIRECTED 15 each 0  ? Insulin Disposable Pump (OMNIPOD DASH SYSTEM) KIT by Does not apply route.    ? Insulin Syringe-Needle U-100 (BD INSULIN SYRINGE ULTRAFINE) 31G X 5/16" 0.5 ML MISC 1 each by Does not apply route 4 (four) times daily. 120 each 5  ? losartan (COZAAR) 25 MG tablet Take 1 tablet (25 mg total) by mouth daily. 90 tablet 3  ? metoCLOPramide (REGLAN) 10 MG tablet TAKE 1 TABLET(10 MG) BY MOUTH THREE TIMES DAILY BEFORE MEALS 90 tablet 1  ? metoprolol tartrate (LOPRESSOR) 25 MG tablet Take 0.5 tablets (12.5 mg total) by mouth 2 (two) times daily. 90 tablet 3  ? pregabalin (LYRICA) 150 MG capsule Take 1 capsule (150 mg total) by mouth 2 (two) times daily. 180 capsule 1  ? rosuvastatin (CRESTOR) 10 MG tablet Take 1 tablet (10 mg total) by mouth daily. 90 tablet 3  ? ?No facility-administered medications prior to visit.  ? ? ? ?ROS ?Review of Systems  ?Constitutional:  Negative for activity change, appetite  change and fatigue.  ?HENT:  Negative for congestion, sinus pressure and sore throat.   ?Eyes:  Negative for visual disturbance.  ?Respiratory:  Negative for cough, chest tightness, shortness of breath and whe

## 2021-09-07 ENCOUNTER — Ambulatory Visit: Payer: Self-pay | Admitting: *Deleted

## 2021-09-07 NOTE — Telephone Encounter (Signed)
Summary: rx request / nausea  ? The patient has been feeling nauseous this morning and would like to be prescribed ondansetron (ZOFRAN) 4 MG tablet [[id number]]  to help with their nausea  ? ?The patient has not experienced any diarrhea and vomiting  ? ?Please contact further when possible   ?  ? ? ? ?Chief Complaint: nausea , requesting zofran pill form  ?Symptoms: nausea started this am. Reports may be from gastroparesis. Last blood glucose 105. Patient has dexcom meter ?Frequency: this am  ?Pertinent Negatives: Patient denies fever, vomiting ?Disposition: '[]'$ ED /'[]'$ Urgent Care (no appt availability in office) / '[]'$ Appointment(In office/virtual)/ '[]'$  [facility]/ '[]'$ Home Care/ '[]'$ Refused Recommended Disposition /'[]'$ [facility]/ '[x]'$  Follow-up with PCP ?Additional Notes:  ? ?Patient reports she was just seen in office yesterday and forgot to mention to PCP request for zofran 4 mg pill form for nausea. Please advise if another appt needed.  ? ?  ? ? ? ? ?Reason for Disposition ? Nausea lasts > 1 week ?   Started today ? ?Answer Assessment - Initial Assessment Questions ?1. NAUSEA SEVERITY: "How bad is the nausea?" (e.g., mild, moderate, severe; dehydration, weight loss) ?  - MILD: loss of appetite without change in eating habits ?  - MODERATE: decreased oral intake without significant weight loss, dehydration, or malnutrition ?  - SEVERE: inadequate caloric or fluid intake, significant weight loss, symptoms of dehydration ?    moderate ?2. ONSET: "When did the nausea begin?" ?    This am  ?3. VOMITING: "Any vomiting?" If Yes, ask: "How many times today?" ?    No  ?4. RECURRENT SYMPTOM: "Have you had nausea before?" If Yes, ask: "When was the last time?" "What happened that time?" ?    Yes , been a while  ?5. CAUSE: "What do you think is causing the nausea?" ?    gastroparesis ?6. PREGNANCY: "Is there any chance you are pregnant?" (e.g., unprotected intercourse, missed birth control pill, broken  condom) ?    na ? ?Protocols used: Nausea-A-AH ? ?

## 2021-09-08 MED ORDER — ONDANSETRON HCL 4 MG PO TABS: 4.0000 mg | ORAL_TABLET | Freq: Three times a day (TID) | ORAL | 0 refills | Status: DC | PRN

## 2021-09-08 NOTE — Telephone Encounter (Signed)
She is currently on Reglan which is for nausea and should not be using both Reglan and Zofran. ?

## 2021-09-08 NOTE — Telephone Encounter (Signed)
Prescription has been sent to pharmacy for Zofran. ?

## 2021-09-08 NOTE — Telephone Encounter (Signed)
Patient states she will not take Reglan.  ?Will only take Zofran.  ? ?Prefers the pill and not ODT .  ? ?

## 2021-09-08 NOTE — Telephone Encounter (Signed)
Patient aware.

## 2021-09-08 NOTE — Telephone Encounter (Signed)
Pt. Given message. States she wants to stop the Reglan "just for awhile and take the Zofran pill." Please advise pt. ?

## 2021-09-19 DIAGNOSIS — R6889 Other general symptoms and signs: Secondary | ICD-10-CM | POA: Diagnosis not present

## 2021-09-22 ENCOUNTER — Encounter: Payer: Self-pay | Admitting: Endocrinology

## 2021-09-22 ENCOUNTER — Ambulatory Visit (INDEPENDENT_AMBULATORY_CARE_PROVIDER_SITE_OTHER): Payer: Medicare HMO | Admitting: Endocrinology

## 2021-09-22 VITALS — BP 130/72 | HR 63 | Ht 61.0 in | Wt 179.0 lb

## 2021-09-22 DIAGNOSIS — E7800 Pure hypercholesterolemia, unspecified: Secondary | ICD-10-CM

## 2021-09-22 DIAGNOSIS — E1040 Type 1 diabetes mellitus with diabetic neuropathy, unspecified: Secondary | ICD-10-CM

## 2021-09-22 DIAGNOSIS — E1065 Type 1 diabetes mellitus with hyperglycemia: Secondary | ICD-10-CM

## 2021-09-22 NOTE — Progress Notes (Signed)
Patient ID: [name], female   DOB: 23-Oct-1965, 56 y.o.   MRN: [id number] ?       ? ? ? ?Reason for Appointment : Follow-up for Type 1 Diabetes ? ?History of Present Illness  ?        ?Diagnosis: Type 1 diabetes mellitus, date of diagnosis: 1977       ? ? ?Previous history:  ?She has mostly been followed by primary care physicians for her diabetes with consistently poor control ?She has had periodic episodes of ketoacidosis ?Previous insulin regimen:  ?BASAL insulin: LANTUS 15 units in a.m. ?MEALTIME insulin: Novolog variable doses: Usually 6-8 at breakfast, 2-3 at lunch and dinner  ? ?Previously using OmniPod insulin pump since 07/07/2019 ? ?Basal rates: Midnight-5 AM = 0 . 75 units, 10 AM = 1.0, 10:30 AM = 1.2, and 4 PM = 0.75 ? ?I/C ratio 1:1;   ISF: 35, target:120 with correction over 120 ? ?Recent history:  ? ?A1c is last 10, previously 8.2 ? ?She has not been using the insulin pump since she has been hospitalized and in rehab ? ?Current management, blood sugar patterns and problems identified:   ?Although in the past she has had relatively better control with the pump her level of control in the last 2 weeks is similar to last month when she just started the pump ?She says that because of moving to a new home and various other issues she has not been paying much attention to her diabetes ?Her use of the insulin pump has been very intermittent ?This is partly from not changing her pod when required or forgetting to resume the insulin pump ?Also appears that she is not always bolusing consistently when she is using the pump with only 41% of insulin in the form of boluses ?Also she will frequently eat graham crackers for snacks without any boluses ?She has been told to take at least 3 to 4 units bolus for every meal but she is mostly bolusing about 2 units and adding the correction factor ?However her blood sugars show significant hyperglycemia on a daily basis ?In the last few days she may have improved blood  sugars overnight with low sugars on 3/28, partly related to correction doses late in the evening ?She has received the OmniPod 5 pump but has not called to start the training ?Time in range is still low at 27% and average blood sugar is slightly higher than before ? ? ?Review of her Dexcom download shows the following recent pattern ? ?She has HYPERGLYCEMIC episodes at all different times of the day and night ?HIGHEST blood sugars are between about 9 PM and 2 AM averaging 250-300 ?Overnight blood sugars are mostly high but inconsistent ?Also may have some fairly good readings at times during the day ?High VARIABILITY is present with standard deviation 96 ? ?Data for the last 2 weeks ? ?GMI 9.2 ? ?CGM use % of time   ?2-week average/GV 248/39  ?Time in range      27%  ?% Time Above 180 25  ?% Time above 250 47  ?% Time Below 70 1  ? ?  ?Previous data: ? ?CGM use % of time 86  ?2-week average/SD 231/76  ?Time in range    27    %  ?% Time Above 180 32  ?% Time above 250 40  ?% Time Below 70 0  ? ? Symptoms of hypoglycemia: Weakness, sometimes none ?Treatment of hypoglycemia: Snacks        ? ?  Self-care: The diet that the patient has been following is: None ? Mealtimes are: Breakfast egg, cheese, sausage, occasionally grits, toast;   Lunch: Sandwich or only snacks, dinner: baked chricken, rice, potatoes ?        ?       ?Dietician consultation: Most recent: years ago.         ?CDE consultation: 04/2018 ? ?Wt Readings from Last 3 Encounters:  ?09/22/21 179 lb (81.2 kg)  ?09/06/21 174 lb (78.9 kg)  ?08/09/21 177 lb 12.8 oz (80.6 kg)  ? ?Diabetes labs: ? ?Lab Results  ?Component Value Date  ? HGBA1C 10.0 (A) 08/09/2021  ? HGBA1C 8.2 03/09/2021  ? HGBA1C 9.0 (A) 08/04/2020  ? ?Lab Results  ?Component Value Date  ? MICROALBUR 3.7 (H) 06/13/2020  ? LDLCALC 105 (H) 08/09/2021  ? CREATININE 1.33 (H) 08/09/2021  ? ? ?Lab Results  ?Component Value Date  ? MICRALBCREAT 4.3 06/13/2020  ? MICRALBCREAT 17.1 04/28/2018   ? ? ? ?Allergies as of 09/22/2021   ? ?   Reactions  ? Anesthetics, Amide Nausea And Vomiting  ? Chlorhexidine   ? Penicillins Diarrhea, Nausea And Vomiting, Other (See Comments)  ? Has patient had a PCN reaction causing immediate rash, facial/tongue/throat swelling, SOB or lightheadedness with hypotension: Yes ?Has patient had a PCN reaction causing severe rash involving mucus membranes or skin necrosis: No ?Has patient had a PCN reaction that required hospitalization No ?Has patient had a PCN reaction occurring within the last 10 years: Yes  ?If all of the above answers are "NO", then may proceed with Cephalosporin use.  ? Buprenorphine Hcl Rash  ? Encainide Nausea And Vomiting  ? Metoclopramide Other (See Comments)  ? Currently no side effects, previously reportedly had dystonia, muscle rigidity, slurred speech, tongue swelling  ? ?  ? ?  ?Medication List  ?  ? ?  ? Accurate as of September 22, 2021  8:13 PM. If you have any questions, ask your nurse or doctor.  ?  ?  ? ?  ? ?aspirin EC 81 MG tablet ?Take 1 tablet (81 mg total) by mouth daily. ?  ?busPIRone 10 MG tablet ?Commonly known as: BUSPAR ?Take 1 tablet (10 mg total) by mouth 3 (three) times daily. ?  ?Contour Next Test test strip ?Generic drug: glucose blood ?TEST FOUR TIMES DAILY ?  ?furosemide 40 MG tablet ?Commonly known as: LASIX ?Take 1 tablet (40 mg total) by mouth daily. ?  ?gentamicin cream 0.1 % ?Commonly known as: GARAMYCIN ?Apply 1 application topically 2 (two) times daily. ?  ?insulin aspart 100 UNIT/ML injection ?Commonly known as: novoLOG ?Inject 100 Units into pump ?  ?Insulin Syringe-Needle U-100 31G X 5/16" 0.5 ML Misc ?Commonly known as: BD Insulin Syringe Ultrafine ?1 each by Does not apply route 4 (four) times daily. ?  ?losartan 25 MG tablet ?Commonly known as: COZAAR ?Take 1 tablet (25 mg total) by mouth daily. ?  ?metoCLOPramide 10 MG tablet ?Commonly known as: REGLAN ?TAKE 1 TABLET(10 MG) BY MOUTH THREE TIMES DAILY BEFORE MEALS ?   ?metoprolol tartrate 25 MG tablet ?Commonly known as: LOPRESSOR ?Take 0.5 tablets (12.5 mg total) by mouth 2 (two) times daily. ?  ?Omnipod DASH PDM (Gen 4) Kit ?by Does not apply route. ?  ?Omnipod DASH Pods (Gen 4) Misc ?CHANGE DASH PODS EVERY 48 HOURS AS DIRECTED ?  ?Omnipod 5 G6 Pod (Gen 5) Misc ?1 Device by Does not apply route every 3 (three) days. ?  ?  ondansetron 4 MG tablet ?Commonly known as: Zofran ?Take 1 tablet (4 mg total) by mouth every 8 (eight) hours as needed for nausea or vomiting. ?  ?pregabalin 150 MG capsule ?Commonly known as: [place]rica ?Take 1 capsule (150 mg total) by mouth 2 (two) times daily. ?  ?rosuvastatin 10 MG tablet ?Commonly known as: CRESTOR ?Take 1 tablet (10 mg total) by mouth daily. ?  ? ?  ? ? ?Allergies:  ?Allergies  ?Allergen Reactions  ? Anesthetics, Amide Nausea And Vomiting  ? Chlorhexidine   ? Penicillins Diarrhea, Nausea And Vomiting and Other (See Comments)  ?  Has patient had a PCN reaction causing immediate rash, facial/tongue/throat swelling, SOB or lightheadedness with hypotension: Yes ?Has patient had a PCN reaction causing severe rash involving mucus membranes or skin necrosis: No ?Has patient had a PCN reaction that required hospitalization No ?Has patient had a PCN reaction occurring within the last 10 years: Yes  ?If all of the above answers are "NO", then may proceed with Cephalosporin use. ?  ? Buprenorphine Hcl Rash  ? Encainide Nausea And Vomiting  ? Metoclopramide Other (See Comments)  ?  Currently no side effects, previously reportedly had dystonia, muscle rigidity, slurred speech, tongue swelling  ? ? ?Past Medical History:  ?Diagnosis Date  ? AKI (acute kidney injury) ([place])   ? Anemia, iron deficiency   ? Anxiety and depression 05/18/2015  ? CAD (coronary artery disease), native coronary artery 01/11/2018  ? s/p CABG - Non-STEMI with severe three-vessel disease noted July 2019  ? Depression   ? Diabetic gastroparesis ([place])   ? Diabetic neuropathy, type I  diabetes mellitus ([place]) 05/18/2015  ? DKA, type 1 ([place]) 03/18/2016  ? Essential hypertension   ? Gastroparesis   ? GERD (gastroesophageal reflux disease)   ? HLD (hyperlipidemia)   ? MDD (major depressive disorder), [place]

## 2021-09-22 NOTE — Patient Instructions (Signed)
Take 3-4 Carbs for supper bolus ? ?Take 1-2 units for snacks (1 unit per 15gm carbs) ?

## 2021-09-25 ENCOUNTER — Ambulatory Visit: Payer: Medicare HMO | Admitting: Physical Therapy

## 2021-10-03 ENCOUNTER — Inpatient Hospital Stay (HOSPITAL_COMMUNITY)
Admission: EM | Admit: 2021-10-03 | Discharge: 2021-10-05 | DRG: 637 | Disposition: A | Discharge status: Home Health | Payer: Medicare HMO | Attending: Internal Medicine | Admitting: Internal Medicine

## 2021-10-03 ENCOUNTER — Inpatient Hospital Stay (HOSPITAL_COMMUNITY): Payer: Medicare HMO

## 2021-10-03 ENCOUNTER — Encounter (HOSPITAL_COMMUNITY): Payer: Self-pay

## 2021-10-03 ENCOUNTER — Emergency Department (HOSPITAL_COMMUNITY): Payer: Medicare HMO

## 2021-10-03 ENCOUNTER — Other Ambulatory Visit: Payer: Self-pay

## 2021-10-03 DIAGNOSIS — F419 Anxiety disorder, unspecified: Secondary | ICD-10-CM | POA: Diagnosis present

## 2021-10-03 DIAGNOSIS — N1832 Chronic kidney disease, stage 3b: Secondary | ICD-10-CM | POA: Diagnosis present

## 2021-10-03 DIAGNOSIS — E1043 Type 1 diabetes mellitus with diabetic autonomic (poly)neuropathy: Secondary | ICD-10-CM | POA: Diagnosis present

## 2021-10-03 DIAGNOSIS — K3184 Gastroparesis: Secondary | ICD-10-CM | POA: Diagnosis not present

## 2021-10-03 DIAGNOSIS — E1022 Type 1 diabetes mellitus with diabetic chronic kidney disease: Secondary | ICD-10-CM | POA: Diagnosis not present

## 2021-10-03 DIAGNOSIS — E860 Dehydration: Secondary | ICD-10-CM | POA: Diagnosis present

## 2021-10-03 DIAGNOSIS — Z6833 Body mass index (BMI) 33.0-33.9, adult: Secondary | ICD-10-CM | POA: Diagnosis not present

## 2021-10-03 DIAGNOSIS — F322 Major depressive disorder, single episode, severe without psychotic features: Secondary | ICD-10-CM | POA: Diagnosis present

## 2021-10-03 DIAGNOSIS — Z87440 Personal history of urinary (tract) infections: Secondary | ICD-10-CM

## 2021-10-03 DIAGNOSIS — Z888 Allergy status to other drugs, medicaments and biological substances status: Secondary | ICD-10-CM

## 2021-10-03 DIAGNOSIS — E785 Hyperlipidemia, unspecified: Secondary | ICD-10-CM | POA: Diagnosis not present

## 2021-10-03 DIAGNOSIS — E669 Obesity, unspecified: Secondary | ICD-10-CM | POA: Diagnosis present

## 2021-10-03 DIAGNOSIS — L89610 Pressure ulcer of right heel, unstageable: Secondary | ICD-10-CM | POA: Diagnosis present

## 2021-10-03 DIAGNOSIS — L8990 Pressure ulcer of unspecified site, unspecified stage: Secondary | ICD-10-CM | POA: Insufficient documentation

## 2021-10-03 DIAGNOSIS — L89623 Pressure ulcer of left heel, stage 3: Secondary | ICD-10-CM | POA: Diagnosis not present

## 2021-10-03 DIAGNOSIS — E1110 Type 2 diabetes mellitus with ketoacidosis without coma: Secondary | ICD-10-CM | POA: Diagnosis present

## 2021-10-03 DIAGNOSIS — D649 Anemia, unspecified: Secondary | ICD-10-CM | POA: Diagnosis present

## 2021-10-03 DIAGNOSIS — E1065 Type 1 diabetes mellitus with hyperglycemia: Secondary | ICD-10-CM | POA: Diagnosis not present

## 2021-10-03 DIAGNOSIS — K219 Gastro-esophageal reflux disease without esophagitis: Secondary | ICD-10-CM | POA: Diagnosis present

## 2021-10-03 DIAGNOSIS — Z79899 Other long term (current) drug therapy: Secondary | ICD-10-CM

## 2021-10-03 DIAGNOSIS — I129 Hypertensive chronic kidney disease with stage 1 through stage 4 chronic kidney disease, or unspecified chronic kidney disease: Secondary | ICD-10-CM | POA: Diagnosis not present

## 2021-10-03 DIAGNOSIS — Z8249 Family history of ischemic heart disease and other diseases of the circulatory system: Secondary | ICD-10-CM

## 2021-10-03 DIAGNOSIS — K0889 Other specified disorders of teeth and supporting structures: Secondary | ICD-10-CM | POA: Diagnosis present

## 2021-10-03 DIAGNOSIS — I252 Old myocardial infarction: Secondary | ICD-10-CM

## 2021-10-03 DIAGNOSIS — I2510 Atherosclerotic heart disease of native coronary artery without angina pectoris: Secondary | ICD-10-CM | POA: Diagnosis present

## 2021-10-03 DIAGNOSIS — Z884 Allergy status to anesthetic agent status: Secondary | ICD-10-CM

## 2021-10-03 DIAGNOSIS — R410 Disorientation, unspecified: Secondary | ICD-10-CM | POA: Diagnosis not present

## 2021-10-03 DIAGNOSIS — Z951 Presence of aortocoronary bypass graft: Secondary | ICD-10-CM | POA: Diagnosis not present

## 2021-10-03 DIAGNOSIS — Z7982 Long term (current) use of aspirin: Secondary | ICD-10-CM | POA: Diagnosis not present

## 2021-10-03 DIAGNOSIS — Z880 Allergy status to penicillin: Secondary | ICD-10-CM

## 2021-10-03 DIAGNOSIS — R627 Adult failure to thrive: Secondary | ICD-10-CM | POA: Diagnosis not present

## 2021-10-03 DIAGNOSIS — M542 Cervicalgia: Secondary | ICD-10-CM | POA: Diagnosis not present

## 2021-10-03 DIAGNOSIS — H547 Unspecified visual loss: Secondary | ICD-10-CM | POA: Diagnosis present

## 2021-10-03 DIAGNOSIS — Z833 Family history of diabetes mellitus: Secondary | ICD-10-CM

## 2021-10-03 DIAGNOSIS — I10 Essential (primary) hypertension: Secondary | ICD-10-CM | POA: Diagnosis not present

## 2021-10-03 DIAGNOSIS — E7800 Pure hypercholesterolemia, unspecified: Secondary | ICD-10-CM

## 2021-10-03 DIAGNOSIS — E1010 Type 1 diabetes mellitus with ketoacidosis without coma: Principal | ICD-10-CM

## 2021-10-03 DIAGNOSIS — E0810 Diabetes mellitus due to underlying condition with ketoacidosis without coma: Secondary | ICD-10-CM | POA: Diagnosis not present

## 2021-10-03 DIAGNOSIS — N1831 Chronic kidney disease, stage 3a: Secondary | ICD-10-CM | POA: Diagnosis not present

## 2021-10-03 DIAGNOSIS — R000 Tachycardia, unspecified: Secondary | ICD-10-CM | POA: Diagnosis not present

## 2021-10-03 DIAGNOSIS — Z91199 Patient's noncompliance with other medical treatment and regimen due to unspecified reason: Secondary | ICD-10-CM

## 2021-10-03 DIAGNOSIS — G9341 Metabolic encephalopathy: Secondary | ICD-10-CM | POA: Diagnosis not present

## 2021-10-03 DIAGNOSIS — Z794 Long term (current) use of insulin: Secondary | ICD-10-CM

## 2021-10-03 DIAGNOSIS — E1040 Type 1 diabetes mellitus with diabetic neuropathy, unspecified: Secondary | ICD-10-CM | POA: Diagnosis present

## 2021-10-03 DIAGNOSIS — F32A Depression, unspecified: Secondary | ICD-10-CM | POA: Diagnosis not present

## 2021-10-03 DIAGNOSIS — I959 Hypotension, unspecified: Secondary | ICD-10-CM | POA: Diagnosis not present

## 2021-10-03 DIAGNOSIS — R739 Hyperglycemia, unspecified: Secondary | ICD-10-CM | POA: Diagnosis not present

## 2021-10-03 DIAGNOSIS — R4182 Altered mental status, unspecified: Secondary | ICD-10-CM | POA: Diagnosis not present

## 2021-10-03 DIAGNOSIS — J9811 Atelectasis: Secondary | ICD-10-CM | POA: Diagnosis not present

## 2021-10-03 DIAGNOSIS — Z8679 Personal history of other diseases of the circulatory system: Secondary | ICD-10-CM | POA: Diagnosis not present

## 2021-10-03 LAB — BASIC METABOLIC PANEL
Anion gap: 15 (ref 5–15)
Anion gap: 9 (ref 5–15)
BUN: 23 mg/dL — ABNORMAL HIGH (ref 6–20)
BUN: 23 mg/dL — ABNORMAL HIGH (ref 6–20)
CO2: 14 mmol/L — ABNORMAL LOW (ref 22–32)
CO2: 20 mmol/L — ABNORMAL LOW (ref 22–32)
Calcium: 9.5 mg/dL (ref 8.9–10.3)
Calcium: 9.4 mg/dL (ref 8.9–10.3)
Chloride: 112 mmol/L — ABNORMAL HIGH (ref 98–111)
Chloride: 114 mmol/L — ABNORMAL HIGH (ref 98–111)
Creatinine, Ser: 1.59 mg/dL — ABNORMAL HIGH (ref 0.44–1.00)
Creatinine, Ser: 1.54 mg/dL — ABNORMAL HIGH (ref 0.44–1.00)
GFR, Estimated: 38 mL/min — ABNORMAL LOW (ref >60)
GFR, Estimated: 40 mL/min — ABNORMAL LOW (ref >60)
Glucose, Bld: 340 mg/dL — ABNORMAL HIGH (ref 70–99)
Glucose, Bld: 234 mg/dL — ABNORMAL HIGH (ref 70–99)
Potassium: 5.3 mmol/L — ABNORMAL HIGH (ref 3.5–5.1)
Potassium: 3.9 mmol/L (ref 3.5–5.1)
Sodium: 141 mmol/L (ref 135–145)
Sodium: 143 mmol/L (ref 135–145)

## 2021-10-03 LAB — I-STAT VENOUS BLOOD GAS, ED
Acid-base deficit: 5 mmol/L — ABNORMAL HIGH (ref 0.0–2.0)
Bicarbonate: 20.1 mmol/L (ref 20.0–28.0)
Calcium, Ion: 1.1 mmol/L — ABNORMAL LOW (ref 1.15–1.40)
HCT: 43 % (ref 36.0–46.0)
Hemoglobin: 14.6 g/dL (ref 12.0–15.0)
O2 Saturation: 81 %
Potassium: 8.2 mmol/L (ref 3.5–5.1)
Sodium: 136 mmol/L (ref 135–145)
TCO2: 21 mmol/L — ABNORMAL LOW (ref 22–32)
pCO2, Ven: 38.1 mmHg — ABNORMAL LOW (ref 44–60)
pH, Ven: 7.33 (ref 7.25–7.43)
pO2, Ven: 48 mmHg — ABNORMAL HIGH (ref 32–45)

## 2021-10-03 LAB — CBG MONITORING, ED
Glucose-Capillary: 412 mg/dL — ABNORMAL HIGH (ref 70–99)
Glucose-Capillary: 305 mg/dL — ABNORMAL HIGH (ref 70–99)
Glucose-Capillary: 288 mg/dL — ABNORMAL HIGH (ref 70–99)
Glucose-Capillary: 194 mg/dL — ABNORMAL HIGH (ref 70–99)
Glucose-Capillary: 196 mg/dL — ABNORMAL HIGH (ref 70–99)

## 2021-10-03 LAB — CBC WITH DIFFERENTIAL/PLATELET
Abs Immature Granulocytes: 0.02 10*3/uL (ref 0.00–0.07)
Basophils Absolute: 0 10*3/uL (ref 0.0–0.1)
Basophils Relative: 0 %
Eosinophils Absolute: 0 10*3/uL (ref 0.0–0.5)
Eosinophils Relative: 0 %
HCT: 42.5 % (ref 36.0–46.0)
Hemoglobin: 13.3 g/dL (ref 12.0–15.0)
Immature Granulocytes: 0 %
Lymphocytes Relative: 5 %
Lymphs Abs: 0.6 10*3/uL — ABNORMAL LOW (ref 0.7–4.0)
MCH: 26 pg (ref 26.0–34.0)
MCHC: 31.3 g/dL (ref 30.0–36.0)
MCV: 83 fL (ref 80.0–100.0)
Monocytes Absolute: 0.2 10*3/uL (ref 0.1–1.0)
Monocytes Relative: 2 %
Neutro Abs: 10.4 10*3/uL — ABNORMAL HIGH (ref 1.7–7.7)
Neutrophils Relative %: 93 %
Platelets: 331 10*3/uL (ref 150–400)
RBC: 5.12 MIL/uL — ABNORMAL HIGH (ref 3.87–5.11)
RDW: 14.6 % (ref 11.5–15.5)
WBC: 11.2 10*3/uL — ABNORMAL HIGH (ref 4.0–10.5)
nRBC: 0 % (ref 0.0–0.2)

## 2021-10-03 LAB — URINALYSIS, ROUTINE W REFLEX MICROSCOPIC
Bilirubin Urine: NEGATIVE
Glucose, UA: >=500 mg/dL — AB
Ketones, ur: 20 mg/dL — AB
Leukocytes,Ua: NEGATIVE
Nitrite: NEGATIVE
Protein, ur: 100 mg/dL — AB
Specific Gravity, Urine: 1.025 (ref 1.005–1.030)
pH: 5 (ref 5.0–8.0)

## 2021-10-03 LAB — BETA-HYDROXYBUTYRIC ACID
Beta-Hydroxybutyric Acid: 4.29 mmol/L — ABNORMAL HIGH (ref 0.05–0.27)
Beta-Hydroxybutyric Acid: 1.27 mmol/L — ABNORMAL HIGH (ref 0.05–0.27)

## 2021-10-03 LAB — HEMOGLOBIN A1C
Hgb A1c MFr Bld: 10 % — ABNORMAL HIGH (ref 4.8–5.6)
Mean Plasma Glucose: 240.3 mg/dL

## 2021-10-03 LAB — HIV ANTIBODY (ROUTINE TESTING W REFLEX): HIV Screen 4th Generation wRfx: NONREACTIVE

## 2021-10-03 LAB — TROPONIN I (HIGH SENSITIVITY): Troponin I (High Sensitivity): 12 ng/L (ref <18)

## 2021-10-03 LAB — TSH: TSH: 0.635 u[IU]/mL (ref 0.350–4.500)

## 2021-10-03 MED ORDER — PREGABALIN 75 MG PO CAPS
150.0000 mg | ORAL_CAPSULE | Freq: Two times a day (BID) | ORAL | Status: DC
  Administered 2021-10-04 – 2021-10-05 (×4): 150 mg via ORAL
  Filled 2021-10-03 (×4): qty 2

## 2021-10-03 MED ORDER — DEXTROSE 50 % IV SOLN: 0.0000 mL | INTRAVENOUS | Status: DC | PRN

## 2021-10-03 MED ORDER — ALBUTEROL SULFATE (2.5 MG/3ML) 0.083% IN NEBU: 2.5000 mg | INHALATION_SOLUTION | RESPIRATORY_TRACT | Status: DC | PRN

## 2021-10-03 MED ORDER — INSULIN REGULAR(HUMAN) IN NACL 100-0.9 UT/100ML-% IV SOLN
INTRAVENOUS | Status: DC
  Administered 2021-10-03: 11 [IU]/h via INTRAVENOUS
  Filled 2021-10-03: qty 100

## 2021-10-03 MED ORDER — ASPIRIN EC 81 MG PO TBEC
81.0000 mg | DELAYED_RELEASE_TABLET | Freq: Every day | ORAL | Status: DC
  Administered 2021-10-04 – 2021-10-05 (×2): 81 mg via ORAL
  Filled 2021-10-03 (×2): qty 1

## 2021-10-03 MED ORDER — ONDANSETRON HCL 4 MG/2ML IJ SOLN: 4.0000 mg | Freq: Four times a day (QID) | INTRAMUSCULAR | Status: DC | PRN

## 2021-10-03 MED ORDER — LORAZEPAM 2 MG/ML IJ SOLN
1.0000 mg | Freq: Once | INTRAMUSCULAR | Status: AC
  Administered 2021-10-03: 1 mg via INTRAVENOUS
  Filled 2021-10-03: qty 1

## 2021-10-03 MED ORDER — KETOROLAC TROMETHAMINE 15 MG/ML IJ SOLN
15.0000 mg | Freq: Once | INTRAMUSCULAR | Status: AC
  Administered 2021-10-03: 15 mg via INTRAVENOUS
  Filled 2021-10-03: qty 1

## 2021-10-03 MED ORDER — SODIUM CHLORIDE 0.9 % IV SOLN: INTRAVENOUS | Status: DC

## 2021-10-03 MED ORDER — METOCLOPRAMIDE HCL 5 MG/ML IJ SOLN
10.0000 mg | Freq: Once | INTRAMUSCULAR | Status: AC
  Administered 2021-10-04: 10 mg via INTRAVENOUS
  Filled 2021-10-03: qty 2

## 2021-10-03 MED ORDER — SODIUM CHLORIDE 0.9 % IV BOLUS
1000.0000 mL | INTRAVENOUS | Status: AC
  Administered 2021-10-03 (×2): 1000 mL via INTRAVENOUS

## 2021-10-03 MED ORDER — METOCLOPRAMIDE HCL 10 MG PO TABS
10.0000 mg | ORAL_TABLET | Freq: Three times a day (TID) | ORAL | Status: DC
  Administered 2021-10-04 – 2021-10-05 (×5): 10 mg via ORAL
  Filled 2021-10-03 (×5): qty 1

## 2021-10-03 MED ORDER — INSULIN ASPART 100 UNIT/ML IJ SOLN
8.0000 [IU] | Freq: Once | INTRAMUSCULAR | Status: AC
  Administered 2021-10-03: 8 [IU] via SUBCUTANEOUS

## 2021-10-03 MED ORDER — DEXTROSE-NACL 5-0.45 % IV SOLN: INTRAVENOUS | Status: DC

## 2021-10-03 MED ORDER — ONDANSETRON HCL 4 MG PO TABS: 4.0000 mg | ORAL_TABLET | Freq: Four times a day (QID) | ORAL | Status: DC | PRN

## 2021-10-03 MED ORDER — ACETAMINOPHEN 500 MG PO TABS
1000.0000 mg | ORAL_TABLET | Freq: Once | ORAL | Status: AC
  Administered 2021-10-03: 1000 mg via ORAL
  Filled 2021-10-03: qty 2

## 2021-10-03 MED ORDER — ONDANSETRON HCL 4 MG/2ML IJ SOLN
4.0000 mg | Freq: Once | INTRAMUSCULAR | Status: AC
  Administered 2021-10-03: 4 mg via INTRAVENOUS
  Filled 2021-10-03: qty 2

## 2021-10-03 NOTE — ED Provider Notes (Signed)
?[facility] ?Provider Note ? ? ?CSN: [id number] ?Arrival date & time: 10/03/21  1511 ? ?  ? ?History ? ?Chief Complaint  ?Patient presents with  ? Hyperglycemia  ? ?LEVEL 5 CAVEAT - ACUITY OF PATIENT ? ?[name] is a 56 y.o. female with PMHx HTN, HLD, CAD, Gastroparesis, Type I Diabetes who presents to the ED via EMS for DKA. Per EMS report pt was supposed to go to a dental appointment today however never went. Maintenance worker where she lives came and checked on her and found her on the ground unresponsive. He called EMS and pt was found to have a CBG in the 300's with Fire and 400's with EMS. EMS reports that they found a bag of prescription medications and does not appear pt has been taking any of her meds.  ? ?Pt reports hx of gastroparesis. States she has been vomiting and has been weak.  ? ?The history is provided by the patient, the EMS personnel and medical records.  ? ?  ? ?Home Medications ?Prior to Admission medications   ?Medication Sig Start Date End Date Taking? Authorizing Provider  ?aspirin EC 81 MG tablet Take 1 tablet (81 mg total) by mouth daily. 06/03/18   Nahser, Wonda Cheng, MD  ?busPIRone (BUSPAR) 10 MG tablet Take 1 tablet (10 mg total) by mouth 3 (three) times daily. ?Patient not taking: Reported on 09/22/2021 11/03/19   [name], MD  ?furosemide (LASIX) 40 MG tablet Take 1 tablet (40 mg total) by mouth daily. 07/29/20   Nahser, Wonda Cheng, MD  ?gentamicin cream (GARAMYCIN) 0.1 % Apply 1 application topically 2 (two) times daily. 08/21/21   Edrick Kins, DPM  ?glucose blood (CONTOUR NEXT TEST) test strip TEST FOUR TIMES DAILY 02/20/21   Elayne Snare, MD  ?insulin aspart (NOVOLOG) 100 UNIT/ML injection Inject 100 Units into pump 07/27/21   Elayne Snare, MD  ?Insulin Disposable Pump (OMNIPOD 5 G6 POD, GEN 5,) MISC 1 Device by Does not apply route every 3 (three) days. 08/09/21   Elayne Snare, MD  ?Insulin Disposable Pump (OMNIPOD DASH PODS, GEN 4,) MISC  CHANGE DASH PODS EVERY 48 HOURS AS DIRECTED 07/27/21   Elayne Snare, MD  ?Insulin Disposable Pump (OMNIPOD DASH SYSTEM) KIT by Does not apply route.    [provider]  ?Insulin Syringe-Needle U-100 (BD INSULIN SYRINGE ULTRAFINE) 31G X 5/16" 0.5 ML MISC 1 each by Does not apply route 4 (four) times daily. 03/20/21   Elayne Snare, MD  ?losartan (COZAAR) 25 MG tablet Take 1 tablet (25 mg total) by mouth daily. 07/29/20   Nahser, Wonda Cheng, MD  ?metoCLOPramide (REGLAN) 10 MG tablet TAKE 1 TABLET(10 MG) BY MOUTH THREE TIMES DAILY BEFORE MEALS 08/09/21   Elayne Snare, MD  ?metoprolol tartrate (LOPRESSOR) 25 MG tablet Take 0.5 tablets (12.5 mg total) by mouth 2 (two) times daily. 07/29/20   Nahser, Wonda Cheng, MD  ?ondansetron (ZOFRAN) 4 MG tablet Take 1 tablet (4 mg total) by mouth every 8 (eight) hours as needed for nausea or vomiting. 09/08/21   [name], MD  ?pregabalin (LYRICA) 150 MG capsule Take 1 capsule (150 mg total) by mouth 2 (two) times daily. 03/22/21   Elayne Snare, MD  ?rosuvastatin (CRESTOR) 10 MG tablet Take 1 tablet (10 mg total) by mouth daily. 08/15/21   Elayne Snare, MD  ?   ? ?Allergies    ?Anesthetics, amide; Chlorhexidine; Penicillins; Buprenorphine hcl; Encainide; and Metoclopramide   ? ?Review of  Systems   ?Review of Systems  ?Unable to perform ROS: Acuity of condition  ?Constitutional:  Negative for fever.  ?Gastrointestinal:  Positive for vomiting.  ? ?Physical Exam ?Updated Vital Signs ?BP (!) 155/60   Pulse 100   Temp 98.3 ?F (36.8 ?C) (Oral)   Resp 20   SpO2 98%  ?Physical Exam ?Vitals and nursing note reviewed.  ?Constitutional:   ?   Appearance: She is not ill-appearing.  ?HENT:  ?   Head: Normocephalic and atraumatic.  ?   Mouth/Throat:  ?   Mouth: Mucous membranes are dry.  ?Eyes:  ?   Conjunctiva/sclera: Conjunctivae normal.  ?Cardiovascular:  ?   Rate and Rhythm: Normal rate and regular rhythm.  ?Pulmonary:  ?   Effort: Pulmonary effort is normal.  ?   Breath sounds: Normal breath  sounds. No wheezing, rhonchi or rales.  ?Abdominal:  ?   Palpations: Abdomen is soft.  ?   Tenderness: There is no abdominal tenderness. There is no guarding or rebound.  ?Musculoskeletal:  ?   Cervical back: Neck supple.  ?Skin: ?   General: Skin is warm and dry.  ?Neurological:  ?   Mental Status: She is lethargic.  ?   Comments: Able to follow commands  ? ? ?ED Results / Procedures / Treatments   ?Labs ?(all labs ordered are listed, but only abnormal results are displayed) ?Labs Reviewed  ?BETA-HYDROXYBUTYRIC ACID - Abnormal; Notable for the following components:  ?    Result Value  ? Beta-Hydroxybutyric Acid 4.29 (*)   ? All other components within normal limits  ?CBC WITH DIFFERENTIAL/PLATELET - Abnormal; Notable for the following components:  ? WBC 11.2 (*)   ? RBC 5.12 (*)   ? Neutro Abs 10.4 (*)   ? Lymphs Abs 0.6 (*)   ? All other components within normal limits  ?BASIC METABOLIC PANEL - Abnormal; Notable for the following components:  ? Potassium 5.3 (*)   ? Chloride 112 (*)   ? CO2 14 (*)   ? Glucose, Bld 340 (*)   ? BUN 23 (*)   ? Creatinine, Ser 1.59 (*)   ? GFR, Estimated 38 (*)   ? All other components within normal limits  ?CBG MONITORING, ED - Abnormal; Notable for the following components:  ? Glucose-Capillary 412 (*)   ? All other components within normal limits  ?I-STAT VENOUS BLOOD GAS, ED - Abnormal; Notable for the following components:  ? pCO2, Ven 38.1 (*)   ? pO2, Ven 48 (*)   ? TCO2 21 (*)   ? Acid-base deficit 5.0 (*)   ? Potassium 8.2 (*)   ? Calcium, Ion 1.10 (*)   ? All other components within normal limits  ?CBG MONITORING, ED - Abnormal; Notable for the following components:  ? Glucose-Capillary 305 (*)   ? All other components within normal limits  ?CBG MONITORING, ED - Abnormal; Notable for the following components:  ? Glucose-Capillary 288 (*)   ? All other components within normal limits  ?BASIC METABOLIC PANEL  ?BASIC METABOLIC PANEL  ?BASIC METABOLIC PANEL  ?BETA-HYDROXYBUTYRIC  ACID  ?URINALYSIS, ROUTINE W REFLEX MICROSCOPIC  ?BASIC METABOLIC PANEL  ?BETA-HYDROXYBUTYRIC ACID  ?TROPONIN I (HIGH SENSITIVITY)  ? ? ?EKG ?EKG Interpretation ? ?Date/Time:  Tuesday October 03 2021 15:29:15 EDT ?Ventricular Rate:  102 ?PR Interval:  144 ?QRS Duration: 95 ?QT Interval:  350 ?QTC Calculation: 456 ?R Axis:   -40 ?Text Interpretation: Sinus tachycardia Left axis deviation Borderline repolarization abnormality  Confirmed by Octaviano Glow [phone]) on 10/03/2021 5:16:03 PM ? ?Radiology ?No results found. ? ?Procedures ?Procedures  ? ? ?Medications Ordered in ED ?Medications  ?insulin regular, human (MYXREDLIN) 100 units/ 100 mL infusion (11 Units/hr Intravenous New Bag/Given 10/03/21 2112)  ?dextrose 50 % solution 0-50 mL (has no administration in time range)  ?0.9 %  sodium chloride infusion ( Intravenous New Bag/Given 10/03/21 2113)  ?dextrose 5 %-0.45 % sodium chloride infusion (has no administration in time range)  ?sodium chloride 0.9 % bolus 1,000 mL (1,000 mLs Intravenous New Bag/Given 10/03/21 1925)  ?ondansetron (ZOFRAN) injection 4 mg (4 mg Intravenous Given 10/03/21 1644)  ?insulin aspart (novoLOG) injection 8 Units (8 Units Subcutaneous Given 10/03/21 1643)  ?LORazepam (ATIVAN) injection 1 mg (1 mg Intravenous Given 10/03/21 1819)  ?acetaminophen (TYLENOL) tablet 1,000 mg (1,000 mg Oral Given 10/03/21 1930)  ? ? ?ED Course/ Medical Decision Making/ A&P ?Clinical Course as of 10/03/21 2156  ?Tue Oct 03, 2021  ?1716 I-STAT showing hyperkalemia, no significant acidosis.  This may be hemolyzed given that it is an i-STAT, however she has had hyperkalemia in the past.  Regardless she has been ordered IV fluids as well as insulin, which should help with her potassium levels.  We are awaiting BMP results.  Her EKG per my review and interpretation does not show any evident hyperkalemic changes, including peak T waves or widening of the QRS. [MT]  ?1726 Potassium(!!): 8.2 [MV]  ?1927 Patient has now had 2  hemolyzed specimens for BMP sent to the lab - both cancelled.  It seems less likely that her initial K value was accurate, but we are stilll awaiting BMP, will need redrawn [MT]  ?  ?Clinical Course User Index ?[MT

## 2021-10-03 NOTE — ED Notes (Signed)
RN attempted IV 2x unsuccessful.  

## 2021-10-03 NOTE — H&P (Addendum)
?History and Physical  ? ? ?[name] NTI:[id number] DOB: 02-Jul-1965 DOA: 10/03/2021 ? ?PCP: [name], MD  ?Patient coming from: home ? ?I have personally briefly reviewed patient's old medical records in [place] ? ?Chief Complaint: found down , with elevated sugars ? ?HPI: [name] is a 56 y.o. female with medical history significant of Type 1 DM, anxiety ,depression, diabetic gastroparesis, hypertension , HLD who presents to ed BIB ems after being found down at home.  IN field per ED patient was noted to have elevated sugars.  ON evaluation in ED patient found to be in DKA. Per patient she has been ill x 2 days with flare of her gastroparesis. She notes that she has not being able to eat x 2 days on due this 24 hours ago suspended her insulin pump. She notes due to recurrent n/v she was not able to take all of her medications.  She notes that she does not remember all the events from today but does remember building maintenance coming to her aide.She states she was on her bed and did not fall or hit her head. She denies any cough fever/ chills or dysuria, note abdominal pain related to n/v. She also notes chronic pain related to her neuropathy. ? ? ?ED Course:  ?Afeb, bp 161/76, hr 102, rr 37  ?Labs ?Glu 412 ?Wbc 11.2, pmn 10.4, hgb 13.3 ?Betahydroxy 4.29  ?Na:141, K5.3, cr 1.59 base 1.3 ?Ph:7.33/ ?Ce12 ?QQ:PYPPJKD drip, ativan 16m ,zofran/ ivfs,tylenol/toradol ?Cxr1. Findings consistent with prior median sternotomy/CABG. ?2. Mild bibasilar atelectasis. ? CT head:nad ?Review of Systems: As per HPI otherwise 10 point review of systems negative.  ? ?Past Medical History:  ?Diagnosis Date  ? [place] kidney [place]) [place]   ? Anemia, iron deficiency   ? Anxiety and depression 05/18/2015  ? CAD (coronary artery disease), native coronary artery 01/11/2018  ? s/p CABG - Non-STEMI with severe three-vessel disease noted July 2019  ? Depression   ? Diabetic gastroparesis ([facility]   ? Diabetic  neuropathy, type [place] diabetes [place] 05/18/2015  ? DKA, type 1 ([facility] 03/18/2016  ? Essential hypertension   ? Gastroparesis   ? GERD (gastroesophageal reflux disease)   ? HLD (hyperlipidemia)   ? MDD (major depressive disorder), single episode, severe , no psychosis ([place]   ? Tardive dyskinesia   ? Vitamin B12 deficiency 08/16/2015  ? ? ?Past Surgical History:  ?Procedure Laterality Date  ? CARDIAC SURGERY    ? [facility] ? CORONARY ARTERY BYPASS GRAFT N/A 01/14/2018  ? Procedure: CORONARY ARTERY BYPASS GRAFTING (CABG)X3, RIGHT AND LEFT SAPHENOUS VEIN HARVEST, MAMMARY TAKE DOWN. MAMMARY TO LAD, SVG TO PD, SVG TO DISTAL CIRC.;  Surgeon: GGrace Isaac MD;  [place]  Service: Open Heart Surgery;  Laterality: N/A;  ? ESOPHAGOGASTRODUODENOSCOPY  [facility]27[facility]2014  [facility] neg for celiac, neg for H pylori.   ? EYE SURGERY    ? gailstones    ? INTRAMEDULLARY (IM) NAIL INTERTROCHANTERIC Right 05/10/2019  ? Procedure: INTRAMEDULLARY (IM) NAIL INTERTROCHANTRIC;  Surgeon: BLovell Sheehan MD;  Location: [facility];  Service: Orthopedics;  Laterality: Right;  ? IR FLUORO GUIDE CV LINE RIGHT  02/01/2017  ? IR FLUORO GUIDE CV LINE RIGHT  03/06/2017  ? IR FLUORO GUIDE CV LINE RIGHT  03/25/2017  ? IR GENERIC HISTORICAL  01/24/2016  ? IR FLUORO GUIDE CV LINE RIGHT 01/24/2016 DNicki Reaper PA-C [facility]  ? IR GENERIC HISTORICAL  01/24/2016  ? IR US GUIDE VASC ACCESS RIGHT 01/24/2016 Darrell K Allred, PA-C [facility]  ? IR US GUIDE VASC ACCESS RIGHT  02/01/2017  ? IR US GUIDE VASC ACCESS RIGHT  03/06/2017  ? IR US GUIDE VASC ACCESS RIGHT  03/25/2017  ? IR US GUIDE VASC ACCESS RIGHT  01/20/2019  ? IR VENIPUNCTURE 26YRS/OLDER BY MD  01/20/2019  ? LEFT HEART CATH AND CORONARY ANGIOGRAPHY N/A 01/07/2018  ? Procedure: LEFT HEART CATH AND CORONARY ANGIOGRAPHY;  Surgeon: Troy Sine, MD;  Location: [facility];  Service: Cardiovascular;  Laterality: N/A;  ? POSTERIOR  VITRECTOMY AND MEMBRANE PEEL-LEFT EYE  09/28/2002  ? POSTERIOR VITRECTOMY AND MEMBRANE PEEL-RIGHT EYE  03/16/2002  ? RETINAL DETACHMENT SURGERY    ? TEE WITHOUT CARDIOVERSION N/A 01/14/2018  ? Procedure: TRANSESOPHAGEAL ECHOCARDIOGRAM (TEE);  Surgeon: Grace Isaac, MD;  Location: [facility];  Service: Open Heart Surgery;  Laterality: N/A;  ? ? ? reports that she has never smoked. She has never used smokeless tobacco. She reports current drug use. Drug: Marijuana. She reports that she does not drink alcohol. ? ?Allergies  ?Allergen Reactions  ? Anesthetics, Amide Nausea And Vomiting  ? Chlorhexidine   ? Penicillins Diarrhea, Nausea And Vomiting and Other (See Comments)  ?  Has patient had a PCN reaction causing immediate rash, facial/tongue/throat swelling, SOB or lightheadedness with hypotension: Yes ?Has patient had a PCN reaction causing severe rash involving mucus membranes or skin necrosis: No ?Has patient had a PCN reaction that required hospitalization No ?Has patient had a PCN reaction occurring within the last 10 years: Yes  ?If all of the above answers are "NO", then may proceed with Cephalosporin use. ?  ? Buprenorphine Hcl Rash  ? Encainide Nausea And Vomiting  ? Metoclopramide Other (See Comments)  ?  Currently no side effects, previously reportedly had dystonia, muscle rigidity, slurred speech, tongue swelling  ? ? ?Family History  ?Problem Relation Age of Onset  ? Cystic fibrosis Mother   ? Hypertension Father   ? Diabetes Brother   ? Hypertension Maternal Grandmother   ? ? ?Prior to Admission medications   ?Medication Sig Start Date End Date Taking? Authorizing Provider  ?aspirin EC 81 MG tablet Take 1 tablet (81 mg total) by mouth daily. 06/03/18   Nahser, Wonda Cheng, MD  ?busPIRone (BUSPAR) 10 MG tablet Take 1 tablet (10 mg total) by mouth 3 (three) times daily. ?Patient not taking: Reported on 09/22/2021 11/03/19   [name], MD  ?furosemide (LASIX) 40 MG tablet Take 1 tablet (40 mg total) by mouth  daily. 07/29/20   Nahser, Wonda Cheng, MD  ?gentamicin cream (GARAMYCIN) 0.1 % Apply 1 application topically 2 (two) times daily. 08/21/21   Edrick Kins, DPM  ?glucose blood (CONTOUR NEXT TEST) test strip TEST FOUR TIMES DAILY 02/20/21   Elayne Snare, MD  ?insulin aspart (NOVOLOG) 100 UNIT/ML injection Inject 100 Units into pump 07/27/21   Elayne Snare, MD  ?Insulin Disposable Pump (OMNIPOD 5 G6 POD, GEN 5,) MISC 1 Device by Does not apply route every 3 (three) days. 08/09/21   Elayne Snare, MD  ?Insulin Disposable Pump (OMNIPOD DASH PODS, GEN 4,) MISC CHANGE DASH PODS EVERY 48 HOURS AS DIRECTED 07/27/21   Elayne Snare, MD  ?Insulin Disposable Pump (OMNIPOD DASH SYSTEM) KIT by Does not apply route.    [provider]  ?Insulin Syringe-Needle U-100 (BD INSULIN SYRINGE ULTRAFINE) 31G X 5/16"  0.5 ML MISC 1 each by Does not apply route 4 (four) times daily. 03/20/21   Kumar, Ajay, MD  ?losartan (COZAAR) 25 MG tablet Take 1 tablet (25 mg total) by mouth daily. 07/29/20   Nahser, Philip J, MD  ?metoCLOPramide (REGLAN) 10 MG tablet TAKE 1 TABLET(10 MG) BY MOUTH THREE TIMES DAILY BEFORE MEALS 08/09/21   Kumar, Ajay, MD  ?metoprolol tartrate (LOPRESSOR) 25 MG tablet Take 0.5 tablets (12.5 mg total) by mouth 2 (two) times daily. 07/29/20   Nahser, Philip J, MD  ?ondansetron (ZOFRAN) 4 MG tablet Take 1 tablet (4 mg total) by mouth every 8 (eight) hours as needed for nausea or vomiting. 09/08/21   Newlin, Enobong, MD  ?pregabalin (LYRICA) 150 MG capsule Take 1 capsule (150 mg total) by mouth 2 (two) times daily. 03/22/21   Kumar, Ajay, MD  ?rosuvastatin (CRESTOR) 10 MG tablet Take 1 tablet (10 mg total) by mouth daily. 08/15/21   Kumar, Ajay, MD  ? ? ?Physical Exam: ?Vitals:  ? 10/03/21 1815 10/03/21 1900 10/03/21 2015 10/03/21 2115  ?BP: (!) 169/50 126/70 (!) 150/71 (!) 155/60  ?Pulse: (!) 112 (!) 107 (!) 105 100  ?Resp: (!) 30 19 20 20  ?Temp:      ?TempSrc:      ?SpO2: 99% 98% 99% 98%  ? ? ? ?Vitals:  ? 10/03/21 1815 10/03/21 1900  10/03/21 2015 10/03/21 2115  ?BP: (!) 169/50 126/70 (!) 150/71 (!) 155/60  ?Pulse: (!) 112 (!) 107 (!) 105 100  ?Resp: (!) 30 19 20 20  ?Temp:      ?TempSrc:      ?SpO2: 99% 98% 99% 98%  ?Constitutional: NAD, anxious ?Eyes

## 2021-10-04 ENCOUNTER — Inpatient Hospital Stay (HOSPITAL_COMMUNITY): Payer: Medicare HMO

## 2021-10-04 DIAGNOSIS — N1831 Chronic kidney disease, stage 3a: Secondary | ICD-10-CM

## 2021-10-04 DIAGNOSIS — E785 Hyperlipidemia, unspecified: Secondary | ICD-10-CM

## 2021-10-04 DIAGNOSIS — Z951 Presence of aortocoronary bypass graft: Secondary | ICD-10-CM

## 2021-10-04 DIAGNOSIS — F419 Anxiety disorder, unspecified: Secondary | ICD-10-CM

## 2021-10-04 DIAGNOSIS — E1022 Type 1 diabetes mellitus with diabetic chronic kidney disease: Secondary | ICD-10-CM

## 2021-10-04 DIAGNOSIS — K3184 Gastroparesis: Secondary | ICD-10-CM

## 2021-10-04 DIAGNOSIS — I129 Hypertensive chronic kidney disease with stage 1 through stage 4 chronic kidney disease, or unspecified chronic kidney disease: Secondary | ICD-10-CM

## 2021-10-04 DIAGNOSIS — N1832 Chronic kidney disease, stage 3b: Secondary | ICD-10-CM

## 2021-10-04 DIAGNOSIS — Z8679 Personal history of other diseases of the circulatory system: Secondary | ICD-10-CM

## 2021-10-04 DIAGNOSIS — F32A Depression, unspecified: Secondary | ICD-10-CM

## 2021-10-04 DIAGNOSIS — E0810 Diabetes mellitus due to underlying condition with ketoacidosis without coma: Secondary | ICD-10-CM

## 2021-10-04 DIAGNOSIS — E1043 Type 1 diabetes mellitus with diabetic autonomic (poly)neuropathy: Secondary | ICD-10-CM

## 2021-10-04 DIAGNOSIS — G9341 Metabolic encephalopathy: Secondary | ICD-10-CM | POA: Diagnosis not present

## 2021-10-04 LAB — BASIC METABOLIC PANEL
Anion gap: 9 (ref 5–15)
Anion gap: 4 — ABNORMAL LOW (ref 5–15)
BUN: 24 mg/dL — ABNORMAL HIGH (ref 6–20)
BUN: 24 mg/dL — ABNORMAL HIGH (ref 6–20)
CO2: 21 mmol/L — ABNORMAL LOW (ref 22–32)
CO2: 22 mmol/L (ref 22–32)
Calcium: 9.5 mg/dL (ref 8.9–10.3)
Calcium: 8.8 mg/dL — ABNORMAL LOW (ref 8.9–10.3)
Chloride: 114 mmol/L — ABNORMAL HIGH (ref 98–111)
Chloride: 117 mmol/L — ABNORMAL HIGH (ref 98–111)
Creatinine, Ser: 1.6 mg/dL — ABNORMAL HIGH (ref 0.44–1.00)
Creatinine, Ser: 1.47 mg/dL — ABNORMAL HIGH (ref 0.44–1.00)
GFR, Estimated: 38 mL/min — ABNORMAL LOW (ref >60)
GFR, Estimated: 42 mL/min — ABNORMAL LOW (ref >60)
Glucose, Bld: 200 mg/dL — ABNORMAL HIGH (ref 70–99)
Glucose, Bld: 134 mg/dL — ABNORMAL HIGH (ref 70–99)
Potassium: 4 mmol/L (ref 3.5–5.1)
Potassium: 4 mmol/L (ref 3.5–5.1)
Sodium: 144 mmol/L (ref 135–145)
Sodium: 143 mmol/L (ref 135–145)

## 2021-10-04 LAB — CBC
HCT: 33 % — ABNORMAL LOW (ref 36.0–46.0)
Hemoglobin: 10.5 g/dL — ABNORMAL LOW (ref 12.0–15.0)
MCH: 26.3 pg (ref 26.0–34.0)
MCHC: 31.8 g/dL (ref 30.0–36.0)
MCV: 82.7 fL (ref 80.0–100.0)
Platelets: 289 10*3/uL (ref 150–400)
RBC: 3.99 MIL/uL (ref 3.87–5.11)
RDW: 14.7 % (ref 11.5–15.5)
WBC: 8.6 10*3/uL (ref 4.0–10.5)
nRBC: 0 % (ref 0.0–0.2)

## 2021-10-04 LAB — COMPREHENSIVE METABOLIC PANEL
ALT: 16 U/L (ref 0–44)
AST: 18 U/L (ref 15–41)
Albumin: 3.1 g/dL — ABNORMAL LOW (ref 3.5–5.0)
Alkaline Phosphatase: 85 U/L (ref 38–126)
Anion gap: 6 (ref 5–15)
BUN: 24 mg/dL — ABNORMAL HIGH (ref 6–20)
CO2: 20 mmol/L — ABNORMAL LOW (ref 22–32)
Calcium: 8.6 mg/dL — ABNORMAL LOW (ref 8.9–10.3)
Chloride: 116 mmol/L — ABNORMAL HIGH (ref 98–111)
Creatinine, Ser: 1.45 mg/dL — ABNORMAL HIGH (ref 0.44–1.00)
GFR, Estimated: 43 mL/min — ABNORMAL LOW (ref >60)
Glucose, Bld: 181 mg/dL — ABNORMAL HIGH (ref 70–99)
Potassium: 4 mmol/L (ref 3.5–5.1)
Sodium: 142 mmol/L (ref 135–145)
Total Bilirubin: 0.9 mg/dL (ref 0.3–1.2)
Total Protein: 6.7 g/dL (ref 6.5–8.1)

## 2021-10-04 LAB — CBG MONITORING, ED
Glucose-Capillary: 116 mg/dL — ABNORMAL HIGH (ref 70–99)
Glucose-Capillary: 117 mg/dL — ABNORMAL HIGH (ref 70–99)
Glucose-Capillary: 124 mg/dL — ABNORMAL HIGH (ref 70–99)
Glucose-Capillary: 131 mg/dL — ABNORMAL HIGH (ref 70–99)
Glucose-Capillary: 138 mg/dL — ABNORMAL HIGH (ref 70–99)
Glucose-Capillary: 141 mg/dL — ABNORMAL HIGH (ref 70–99)
Glucose-Capillary: 141 mg/dL — ABNORMAL HIGH (ref 70–99)
Glucose-Capillary: 148 mg/dL — ABNORMAL HIGH (ref 70–99)
Glucose-Capillary: 151 mg/dL — ABNORMAL HIGH (ref 70–99)
Glucose-Capillary: 154 mg/dL — ABNORMAL HIGH (ref 70–99)
Glucose-Capillary: 175 mg/dL — ABNORMAL HIGH (ref 70–99)
Glucose-Capillary: 177 mg/dL — ABNORMAL HIGH (ref 70–99)
Glucose-Capillary: 190 mg/dL — ABNORMAL HIGH (ref 70–99)

## 2021-10-04 LAB — GLUCOSE, CAPILLARY
Glucose-Capillary: 288 mg/dL — ABNORMAL HIGH (ref 70–99)
Glucose-Capillary: 278 mg/dL — ABNORMAL HIGH (ref 70–99)

## 2021-10-04 LAB — BETA-HYDROXYBUTYRIC ACID: Beta-Hydroxybutyric Acid: 0.14 mmol/L (ref 0.05–0.27)

## 2021-10-04 LAB — LACTIC ACID, PLASMA
Lactic Acid, Venous: 1.2 mmol/L (ref 0.5–1.9)
Lactic Acid, Venous: 2.6 mmol/L (ref 0.5–1.9)

## 2021-10-04 LAB — CK: Total CK: 209 U/L (ref 38–234)

## 2021-10-04 LAB — SEDIMENTATION RATE: Sed Rate: 31 mm/hr — ABNORMAL HIGH (ref 0–22)

## 2021-10-04 MED ORDER — INSULIN ASPART 100 UNIT/ML IJ SOLN: 0.0000 [IU] | Freq: Three times a day (TID) | INTRAMUSCULAR | Status: DC

## 2021-10-04 MED ORDER — INSULIN ASPART 100 UNIT/ML IJ SOLN
0.0000 [IU] | Freq: Three times a day (TID) | INTRAMUSCULAR | Status: DC
  Administered 2021-10-04: 8 [IU] via SUBCUTANEOUS
  Administered 2021-10-05: 5 [IU] via SUBCUTANEOUS
  Administered 2021-10-05: 3 [IU] via SUBCUTANEOUS

## 2021-10-04 MED ORDER — INSULIN GLARGINE-YFGN 100 UNIT/ML ~~LOC~~ SOLN
25.0000 [IU] | Freq: Every day | SUBCUTANEOUS | Status: DC
Start: 2021-10-04 — End: 2021-10-05
  Administered 2021-10-04 – 2021-10-05 (×2): 25 [IU] via SUBCUTANEOUS
  Filled 2021-10-04 (×2): qty 0.25

## 2021-10-04 MED ORDER — INSULIN REGULAR(HUMAN) IN NACL 100-0.9 UT/100ML-% IV SOLN
INTRAVENOUS | Status: DC
  Administered 2021-10-04: 1.1 [IU]/h via INTRAVENOUS

## 2021-10-04 MED ORDER — ACETAMINOPHEN 500 MG PO TABS
1000.0000 mg | ORAL_TABLET | Freq: Four times a day (QID) | ORAL | Status: DC | PRN
  Administered 2021-10-04 – 2021-10-05 (×3): 1000 mg via ORAL
  Filled 2021-10-04 (×3): qty 2

## 2021-10-04 MED ORDER — INSULIN ASPART 100 UNIT/ML IJ SOLN
3.0000 [IU] | Freq: Three times a day (TID) | INTRAMUSCULAR | Status: DC
  Administered 2021-10-04 – 2021-10-05 (×3): 3 [IU] via SUBCUTANEOUS

## 2021-10-04 NOTE — ED Notes (Signed)
Date and time results received: 10/04/21 12:49 AM ? ? ?(use smartphrase ".now" to insert current time) ? ?Test: lactic acid ?Critical Value: 2.6 ? ?Name of Provider Notified: Dr. Marcello Moores ? ?Orders Received? Or Actions Taken?:  no orders at this time ?

## 2021-10-04 NOTE — Progress Notes (Signed)
Inpatient Diabetes Program Recommendations ? ?AACE/ADA: New Consensus Statement on Inpatient Glycemic Control (2015) ? ?Target Ranges:  Prepandial:   less than 140 mg/dL ?     Peak postprandial:   less than 180 mg/dL (1-2 hours) ?     Critically ill patients:  140 - 180 mg/dL  ? ?Lab Results  ?Component Value Date  ? GLUCAP 190 (H) 10/04/2021  ? HGBA1C 10.0 (H) 10/03/2021  ? ? ?Review of Glycemic Control ? Latest Reference Range & Units 10/04/21 07:47 10/04/21 08:54 10/04/21 10:10 10/04/21 11:30 10/04/21 14:44  ?Glucose-Capillary 70 - 99 mg/dL 124 (H) 116 (H) 141 (H) 151 (H) 190 (H)  ? ?Diabetes history: DM 1 ?Outpatient Diabetes medications: Omnipod insulin pump, Lantus 15 units when not using pump ?Insulin pump settings: ?Basal rates:  ?Midnight-5 AM = 0 . 75 units ?10 AM = 1.0 ?10:30 AM = 1.2 ?4 PM- midnight = 0.75 ?  ?I/C ratio 1:1 ?ISF: 35 ?target:120 ?correction over 120 ? ?Current orders for Inpatient glycemic control:  ?Semglee 25 units Daily ?Novolog 0-15 units tid  ?Novolog 3 units tid meal coverage ? ?Endocrinologist: Elayne Snare last visit 09/22/21 ?A1c 10% on 10/03/21 ? ?Spoke with pt at bedside regarding A1c and insulin pump at home. Pt reports having a dexcom CGM to monitor glucose trends at home. Currently dexcom inserted in right abdomen. Pt reports being placed on a new insulin pump when she receives education for it. Pt reports not accurately counting carbs for boluses. Encouraged the use of apps to assist in this. Gave list of common carb counting apps in AVS to pt to decide which on to utilize. Encouraged follow up with glucose trends. Reviewed glucose and A1c goals for outpatient trends. ? ?Thanks, ? ?Tama Headings RN, MSN, BC-ADM ?Inpatient Diabetes Coordinator ?Team Pager [phone] (8a-5p) ?

## 2021-10-04 NOTE — ED Notes (Signed)
Pt states she stills feels the same since yesterday. RN informed pt she is more responsive and alert today. Pt states her son is working out of town at the moment. If we speak to family it will be her sister. Pt states her tooth is hurting 8/10 pain. Pt was suppose to get dental extraction yesterday but ended in the hospital. ? ?Pt is AOX4 (Self, Time, Location, and Situation). ?

## 2021-10-04 NOTE — Progress Notes (Addendum)
?PROGRESS NOTE ? ? ? ?[name]  TDD:[id number] DOB: 1965-07-19 DOA: 10/03/2021 ?PCP: [name], MD  ? ? ? ?Brief Narrative:  ?[name] is a 56 y.o. female with medical history significant of Type 1 DM, anxiety ,depression, diabetic gastroparesis, hypertension , HLD who presents to ed BIB ems after being found down at home.  IN field per ED patient was noted to have elevated sugars.  ON evaluation in ED patient found to be in DKA. Per patient she has been ill x 2 days with flare of her gastroparesis. She notes that she has not being able to eat x 2 days on due this 24 hours ago suspended her insulin pump. She notes due to recurrent n/v she was not able to take all of her medications.  She notes that she does not remember all the events from today but does remember building maintenance coming to her aide.She states she was on her bed and did not fall or hit her head. She denies any cough fever/ chills or dysuria, note abdominal pain related to n/v. She also notes chronic pain related to her neuropathy. ? ? ?Subjective: ? ?She wants to eat, currently denies ab pain, no n/v ?Reports she think her gastroparesis was acting up at home which caused her to be in DKA ?She states EMS did not bring her walker , she states she needs a walker to ambulate , she plans to have her sister to bring in the walker ?She desires to go home tomorrow if she can because her sister is off tomorrow and can bring her home ? ?C/o  tooth pain on the left  backside  , seen by dentist, was told need to have her tooth pulled, missed her appointment due to in DKA ? ?Assessment & Plan: ? Principal Problem: ?  DKA (diabetic ketoacidosis) ([place]) ? ? ? ?DKA Type 1 Diabetic ?-Report had flareup of gastroparesis not able to eat and turn off her insulin pump, likely the cause of DKA ?-Gap closed on insulin drip , transition to subcu insulin  ?-Diabetes education ?  ? ?  ?Metabolic Encephalopathy ?-due to DKA ?-initially found down at scene   ?-CT head/CT cervical spine no acute findings ?-She is alert oriented x3 this morning ? ?UA abn ?-Per chart review she was seen in ED on March 6 for UTI ?-No fever , no leukocytosis , denies urinary symptom  ?-Follow-up on urine culture  ?  ?CKDIIIb ?Cr close to baseline ?Renal dosing meds ? ?Normocytic anemia ?Hemoglobin initially on presentation was normal likely due to hemoconcentration in the setting of dehydration ?Monitor CBC ?No overt bleeding ?  ?  ? diabetic gastroparesis ? - on reglan and zofran ?-Denies Ab pain, no nausea no vomiting, she desire to eat ?  ?Hypertension  ?-stable continue Metoprolol  ?-home meds lasix and cozaar held ?  ?  ? HLD ?Continue statin  ? ?H/o CAD s/p CABG in 2019 ?Denies chest pain ?Continue aspirin statin metoprolol ? ?Anxiety /depression ?-resume home anxiolytic as able  ? ?FTT: reports lives alone, uses a walker, will get PT eval ? ? ?Body mass index is 33.63 kg/m?., meet obesity criteria  ? ?  ?  ? ? ?I have Reviewed nursing notes, Vitals, pain scores, I/o's, Lab results and  imaging results since pt's last encounter, details please see discussion above  ?I ordered the following labs:  ?Unresulted Labs (From admission, onward)  ? ?  Start     Ordered  ?  10/05/21 0500  CBC  Tomorrow morning,   R       ? 10/04/21 1517  ? 10/05/21 7654  Basic metabolic panel  Tomorrow morning,   R       ? 10/04/21 1517  ? 10/05/21 0500  Magnesium  Tomorrow morning,   STAT       ? 10/04/21 1517  ? 10/04/21 0310  C-reactive protein  Once,   R       ? 10/04/21 0310  ? 10/03/21 2348  Urine Culture  Once,   R       ?Question:  Indication  Answer:  Altered mental status (if no other cause identified)  ? 10/03/21 2348  ? 10/03/21 2211  Blood gas, venous  Once,   R       ? 10/03/21 2211  ? ?  ?  ? ?  ? ? ? ?DVT prophylaxis: SCDs Start: 10/03/21 2207 ? ? ?Code Status:   Code Status: Full Code ? ?Family Communication: Called Sister ,not able to leave a message ?Disposition:  ? ?Status is: Inpatient ?   ? ?Dispo: The patient is from: home ?             Anticipated d/c is to: home ?             Anticipated d/c date is: possible on 4/13 ? ?Antimicrobials:   ? ?Anti-infectives (From admission, onward)  ? ? None  ? ?  ? ? ? ? ? ?Objective: ?Vitals:  ? 10/04/21 1200 10/04/21 1300 10/04/21 1400 10/04/21 1500  ?BP: 127/65 (!) 111/56 (!) 115/56 138/66  ?Pulse: 80 76 75 78  ?Resp: 14 13 (!) 21 20  ?Temp:      ?TempSrc:      ?SpO2: 97% 98% 97% 100%  ? ? ?Intake/Output Summary (Last 24 hours) at 10/04/2021 1535 ?Last data filed at 10/03/2021 2123 ?Gross per 24 hour  ?Intake 2000 ml  ?Output --  ?Net 2000 ml  ? ?There were no vitals filed for this visit. ? ?Examination: ? ?General exam: aaox3, but appear weak, reports baseline visual impairment, able to walk with a walker at home , able to cook for herself ?Respiratory system: Clear to auscultation. Respiratory effort normal. ?Cardiovascular system:  RRR.  ?Gastrointestinal system: Abdomen is nondistended, soft and nontender.  Normal bowel sounds heard. ?Central nervous system: Alert and oriented. No focal neurological deficits. ?Extremities:  no edema ?Skin: No rashes, lesions or ulcers ?Psychiatry: Judgement and insight appear normal. Mood & affect appropriate.  ? ? ? ?Data Reviewed: I have personally reviewed  labs and visualized  imaging studies since the last encounter and formulate the plan  ? ? ? ? ? ? ?Scheduled Meds: ? aspirin EC  81 mg Oral Daily  ? insulin aspart  0-15 Units Subcutaneous TID WC  ? insulin aspart  3 Units Subcutaneous TID WC  ? insulin glargine-yfgn  25 Units Subcutaneous Daily  ? metoCLOPramide  10 mg Oral TID AC  ? pregabalin  150 mg Oral BID  ? ?Continuous Infusions: ? ? ? ? LOS: 1 day  ? ? ? ? ?Florencia Reasons, MD PhD FACP ?[facility] ? ?Available via Epic secure chat 7am-7pm for nonurgent issues ?Please page for urgent issues ?To page the attending provider between 7A-7P or the covering provider during after hours 7P-7A, please log into the web  site [email or web address] and access using universal [place] password for that web site. If you do not have the password,  please call the hospital operator. ? ? ? ?10/04/2021, 3:35 PM  ? ? ?

## 2021-10-04 NOTE — ED Notes (Signed)
Pt at bedside eating lunch tray 

## 2021-10-04 NOTE — ED Notes (Signed)
Pt requested some water. RN will notify provider. ?

## 2021-10-04 NOTE — Discharge Instructions (Signed)
Name of Apps For Carb Counting: Compatibility Cost Category Description ?-    BD Briight: Diabetes Assistant   Apple/Android  cost: Free  - Management and Monitoring  Nutrition advice, healthy recipes, and activities  Digital diabetes assistant available 24/7  Voice recognition for logging insulin doses/blood glucose, asking question ? ?-    CalorieKing Apple Free, with inapp purchases Nutrition and fitness   Curated food database of nutrition information  Includes many fast-food chains and restaurants  ? ?-    [employer] with [employer] Nutrition and fitness  Visualization tool for learning different portion sizes of food items with ?nutrition data  Paid upgrade tracks what you eat, allows custom food entry, records progress ? ?-    Fooducate Apple/Android Free, with inapp purchases Nutrition and fitness  Nutrition tracking tool  Individualized nutrition grade for each food  Personalization for age, sex, weight loss goal  Community support ? ?-   Glucagon Apple/Android Free Management and Monitoring  Step-by-step instructions (text and video) for using glucagon for injection: 1 mg ?(1 unit)  Tracker for glucagon kit locations and expiration dates  Reminder notifications  ? ?-    MyFitnessPal Apple/Android Free, with inapp purchases Management and Monitoring  Tracking tool for exercise, nutrition  Connects with fitness trackers and other health apps  Large food database with nutrition information  Individualized based on sex, age, weight loss goal ? ?-    MySugr Apple/Android Free, with inapp purchases Management and Monitoring  Tracking tool for blood glucose, mood, carbohydrates, medications  Reports for HCPs  Paid upgrade adds reminders ? ?-    One Drop Apple/Android Free Management and Monitoring  Tracking tool for blood glucose, mood, carbohydrates, medications  Integrated nutrition database  Reminders  Reports for HCPs ? ?-    Relax Lite Apple/Android Free, with inapp purchases Stress Management   Guided breathing and meditation exercises  ? ?-    Tidepool Apple/Android Free Management, and Monitoring,  Compatibility with many devices (glucose meters, CGM devices, insulin pumps),  Tracking tool for insulin, CGM, nutrition, and blood glucose data with notes  Reports for HCPs ?

## 2021-10-04 NOTE — ED Notes (Signed)
Florencia Reasons MD stated to stop insulin drip 1-hour after administering Semglee. Semglee administered at 11:00 ?

## 2021-10-04 NOTE — ED Notes (Signed)
RN notified Yvette Reasons MD pt insulin has been adjusted to 0.4 units and Anion Gap 4. ?

## 2021-10-05 DIAGNOSIS — L8990 Pressure ulcer of unspecified site, unspecified stage: Secondary | ICD-10-CM | POA: Insufficient documentation

## 2021-10-05 DIAGNOSIS — G9341 Metabolic encephalopathy: Secondary | ICD-10-CM | POA: Diagnosis not present

## 2021-10-05 DIAGNOSIS — E1065 Type 1 diabetes mellitus with hyperglycemia: Secondary | ICD-10-CM | POA: Diagnosis not present

## 2021-10-05 DIAGNOSIS — E0810 Diabetes mellitus due to underlying condition with ketoacidosis without coma: Secondary | ICD-10-CM

## 2021-10-05 LAB — CBC
HCT: 35.1 % — ABNORMAL LOW (ref 36.0–46.0)
Hemoglobin: 10.9 g/dL — ABNORMAL LOW (ref 12.0–15.0)
MCH: 25.8 pg — ABNORMAL LOW (ref 26.0–34.0)
MCHC: 31.1 g/dL (ref 30.0–36.0)
MCV: 83 fL (ref 80.0–100.0)
Platelets: 248 10*3/uL (ref 150–400)
RBC: 4.23 MIL/uL (ref 3.87–5.11)
RDW: 15.1 % (ref 11.5–15.5)
WBC: 6.6 10*3/uL (ref 4.0–10.5)
nRBC: 0 % (ref 0.0–0.2)

## 2021-10-05 LAB — URINE CULTURE

## 2021-10-05 LAB — BASIC METABOLIC PANEL
Anion gap: 8 (ref 5–15)
BUN: 26 mg/dL — ABNORMAL HIGH (ref 6–20)
CO2: 18 mmol/L — ABNORMAL LOW (ref 22–32)
Calcium: 8.8 mg/dL — ABNORMAL LOW (ref 8.9–10.3)
Chloride: 111 mmol/L (ref 98–111)
Creatinine, Ser: 1.41 mg/dL — ABNORMAL HIGH (ref 0.44–1.00)
GFR, Estimated: 44 mL/min — ABNORMAL LOW (ref >60)
Glucose, Bld: 203 mg/dL — ABNORMAL HIGH (ref 70–99)
Potassium: 4.1 mmol/L (ref 3.5–5.1)
Sodium: 137 mmol/L (ref 135–145)

## 2021-10-05 LAB — MAGNESIUM: Magnesium: 1.9 mg/dL (ref 1.7–2.4)

## 2021-10-05 LAB — GLUCOSE, CAPILLARY
Glucose-Capillary: 211 mg/dL — ABNORMAL HIGH (ref 70–99)
Glucose-Capillary: 152 mg/dL — ABNORMAL HIGH (ref 70–99)

## 2021-10-05 MED ORDER — ROSUVASTATIN CALCIUM 10 MG PO TABS: 10.0000 mg | ORAL_TABLET | Freq: Every day | ORAL | 3 refills | Status: DC

## 2021-10-05 MED ORDER — ONDANSETRON HCL 4 MG PO TABS: 4.0000 mg | ORAL_TABLET | Freq: Three times a day (TID) | ORAL | 0 refills | Status: DC | PRN

## 2021-10-05 MED ORDER — LOSARTAN POTASSIUM 25 MG PO TABS: 25.0000 mg | ORAL_TABLET | Freq: Every day | ORAL | 3 refills | Status: DC

## 2021-10-05 MED ORDER — FUROSEMIDE 10 MG/ML IJ SOLN
40.0000 mg | Freq: Once | INTRAMUSCULAR | Status: DC
  Filled 2021-10-05: qty 4

## 2021-10-05 MED ORDER — METOPROLOL TARTRATE 25 MG PO TABS: 12.5000 mg | ORAL_TABLET | Freq: Two times a day (BID) | ORAL | 3 refills | Status: DC

## 2021-10-05 NOTE — Progress Notes (Signed)
Addendum to discharge notes.  Pt refused to see Education officer, museum for consult.  Stated that her sister is impatient and cannot wait for her.  She said that Education officer, museum can call her at home if she needs to talk to her. ?

## 2021-10-05 NOTE — Progress Notes (Signed)
Discharge instructions given, verbalized understanding.  Discharge home in stable condition. ?

## 2021-10-05 NOTE — TOC Initial Note (Signed)
Transition of Care (TOC) - Initial/Assessment Note  ? ? ?Patient Details  ?Name: [name] ?MRN: [id number] ?Date of Birth: 1966-04-12 ? ?Transition of Care [facility]) CM/SW Contact:    ?Ninfa Meeker, RN ?Phone Number: ?10/05/2021, 11:50 AM ? ?Clinical Narrative:   Patient is 56 y.o. female who was brought to ED on  10/03/21 after being found down at home. +DKA, metabolic encephalopathy. Patient has bilateral heel wounds.   ?Case Manager spoke with patient concerning need for [facility] and to address her concern regarding getting her medications delivered. CM discussed options for [place], patient deferred to [facility] to find highly rated agency. CM explained to patient that she will still need to learn and have a trainable support person.  Referral called to Adela Lank, [facility] Liaison.  Patient says she needs her medications delivered because she doesn't drive and it is difficult getting them picked up. CM called [facility] to find out if medication delivery is available, they will deliver medications but do not deliver insulin or anything that must remain cold. Case manager shared this information with patient. TOC Team will continue to follow.   ? ? ?Expected Discharge Plan: [place] ?Barriers to Discharge: No Barriers Identified ? ? ?Patient Goals and CMS Choice ?  ?  ?  ? ?Expected Discharge Plan and Services ?Expected Discharge Plan: [place] ?In-house Referral: NA ?Discharge Planning Services: CM Consult ?Post Acute Care Choice: Home Health ?Living arrangements for the past 2 months: Apartment ?                ?DME Arranged: N/A ?  ?  ?  ?  ?HH Arranged: Therapist, sports, Nurse's Aide ?[place] Agency: [place] ?Date HH Agency Contacted: 10/05/21 ?Time [place]: 7124 ?Representative spoke with at [place]: Adela Lank ? ?Prior Living Arrangements/Services ?Living arrangements for the past 2 months: Apartment ?Lives with:: Self ?Patient language  and need for interpreter reviewed:: Yes ?Do you feel safe going back to the place where you live?: Yes      ?Need for Family Participation in Patient Care: Yes (Comment) ?Care giver support system in place?:  (has son and sister that will check on her) ?  ?Criminal Activity/Legal Involvement Pertinent to Current Situation/Hospitalization: No - Comment as needed ? ?Activities of Daily Living ?Home Assistive Devices/Equipment: CBG Meter, Walker (specify type), Insulin Pump ?ADL Screening (condition at time of admission) ?Patient's cognitive ability adequate to safely complete daily activities?: No ?Is the patient deaf or have difficulty hearing?: No ?Does the patient have difficulty seeing, even when wearing glasses/contacts?: No ?Does the patient have difficulty concentrating, remembering, or making decisions?: No ?Patient able to express need for assistance with ADLs?: Yes ?Does the patient have difficulty dressing or bathing?: Yes ?Independently performs ADLs?: Yes (appropriate for developmental age) ?Does the patient have difficulty walking or climbing stairs?: Yes ?Weakness of Legs: Right ?Weakness of Arms/Hands: Both ? ?Permission Sought/Granted ?  ?  ?   ? Permission granted to share info w AGENCY: Alvis Lemmings ?   ?   ? ?Emotional Assessment ?  ?Attitude/Demeanor/Rapport: Gracious ?  ?Orientation: : Oriented to Self, Oriented to Place, Oriented to  Time, Oriented to Situation ?Alcohol / Substance Use: Not Applicable ?Psych Involvement: No (comment) ? ?Admission diagnosis:  DKA (diabetic ketoacidosis) ([place]) [E11.10] ?Diabetic ketoacidosis without coma associated with type 1 diabetes mellitus ([place]) [E10.10] ?Patient Active Problem List  ? Diagnosis Date Noted  ? Closed  fracture of proximal end of femur, right, initial encounter ([place]) 05/09/2019  ? CAD (coronary artery disease) 03/06/2019  ? Acute encephalopathy 12/22/2018  ? Diabetic ketoacidosis ([place]) 04/30/2018  ? Gastroparesis 04/13/2018  ? DKA (diabetic  ketoacidosis) ([place]) 03/31/2018  ? S/P CABG x 3 01/14/2018  ? CAD (coronary artery disease), native coronary artery 01/11/2018  ? Dehydration   ? MDD (major depressive disorder), single episode, severe , no psychosis ([place])   ? DKA (diabetic ketoacidoses) 06/16/2017  ? Tardive dyskinesia   ? Type 1 diabetes mellitus with hyperlipidemia ([place]) 03/18/2016  ? Noncompliance with treatment plan 03/13/2016  ? Essential hypertension 01/24/2016  ? HLD (hyperlipidemia)   ? GERD (gastroesophageal reflux disease)   ? AKI (acute kidney injury) ([place])   ? Anemia, iron deficiency   ? Vitamin B12 deficiency 08/16/2015  ? Diabetic gastroparesis ([place])   ? Diabetic neuropathy, type I diabetes mellitus ([place]) 05/18/2015  ? Anxiety and depression 05/18/2015  ? ?PCP:  [name], MD ?Pharmacy:   ?[facility] [place] ?[place] [place] ?[place] [address] ?Phone: [phone] Fax: [phone] ? ? ? ? ?Social Determinants of Health (SDOH) Interventions ?  ? ?Readmission Risk Interventions ?   ? View : No data to display.  ?  ?  ?  ? ? ? ?

## 2021-10-05 NOTE — Discharge Summary (Signed)
? ?Discharge Summary ? ?[name] [id number]:[id number] DOB: October 22, 1965 ? ?PCP: [name], MD ? ?Admit date: 10/03/2021 ?Discharge date: 10/05/2021 ? ?Time spent: 42mns, more than 50% time spent on coordination of care.  ? ?Recommendations for Outpatient Follow-up:  ?F/u with PCP within a week  for hospital discharge follow up, repeat cbc/bmp at follow up. Patient is advised to check blood pressure at home twice a day and bring in record for pcp to review.  ?F/u with endocrinology for insulin adjustment ?Home health RN foam dressing twice a week, home health RN for hypertension and diabetes disease management ? ? ? ?Discharge Diagnoses:  ?Active Hospital Problems  ? Diagnosis Date Noted  ? DKA (diabetic ketoacidosis) ([place] 03/31/2018  ? Pressure injury of skin 10/05/2021  ?  ?Resolved Hospital Problems  ?No resolved problems to display.  ? ? ?Discharge Condition: stable ? ?Diet recommendation: heart healthy/carb modified ? ?Filed Weights  ? 10/04/21 1632  ?Weight: 80.7 kg  ? ? ?History of present illness: (per admitting MD Dr TMarcello Moores ?CLajeana Stroughis a 56y.o. female with medical history significant of Type 1 DM, anxiety ,depression, diabetic gastroparesis, hypertension , HLD who presents to ed BIB ems after being found down at home.  IN field per ED patient was noted to have elevated sugars.  ON evaluation in ED patient found to be in DKA. Per patient she has been ill x 2 days with flare of her gastroparesis. She notes that she has not being able to eat x 2 days on due this 24 hours ago suspended her insulin pump. She notes due to recurrent n/v she was not able to take all of her medications.  She notes that she does not remember all the events from today but does remember building maintenance coming to her aide.She states she was on her bed and did not fall or hit her head. She denies any cough fever/ chills or dysuria, note abdominal pain related to n/v. She also notes chronic pain related to her  neuropathy. ? ?Hospital Course:  ?Principal Problem: ?  D[place] (diabetic ketoacidosis) ([place] ?Active Problems: ?  Pressure injury of skin ? ? ?DKA Type 1 Diabetic ?-Report had flareup of gastroparesis not able to eat and turn off her insulin pump, likely the cause of DKA ?-Gap closed on insulin drip , transitioned to subcu insulin  ?-Diabetes education ?-a1c 10%,  on omnipod at home, reports her endocrinologist is in the process getting her on a different regimen, close follow up with endocrinology  ? -home health RN for disease management  ?  ?  ?Metabolic Encephalopathy ?-due to DKA ?-being found unresponsive in her home by maintenance worker, patient denies LOC, but reports was too weak to get out of bed to get to the door ?-CT head/CT cervical spine no acute findings ?-resolved , back to baseline,  alert oriented x3  ?  ?diabetic gastroparesis ? - on reglan and zofran ?-Denies Ab pain, no nausea no vomiting, she desire to eat ? ?UA abn ?-Per chart review she was seen in ED on March 6 for UTI ?-No fever , no leukocytosis , denies urinary symptom  ?-urine culture with multiple species ?-f/u with pcp ?  ?CKDIIIb ?Cr close to baseline ?Renal dosing meds ?  ?Normocytic anemia ?Hemoglobin initially on presentation was normal likely due to hemoconcentration in the setting of dehydration ?Hgb 10.9 after hydrated  ?No overt bleeding ?  ?  ?  ?Hypertension  ?-blood pressure stable in the hospital without  any home bp meds, advised patient to check blood pressure at home and bring in record to hospital discharge follow up appointment, further blood pressure meds adjustment per pcp ?-holding parameters provided to the patient, she is on  Metoprolol , cozaar and lasix at home ?-close follow up with pcp ?-home health RN for disease management  ? ?Chronic intermittent bilateral lower extremity pitting edema ?Reports take lasix at home ?F/u with pcp ?  ?  ? HLD ?Continue statin  ?  ?H/o CAD s/p CABG in 2019 ?Denies chest  pain ?Continue aspirin statin metoprolol ?  ?Anxiety /depression ?-resume home anxiolytic as able  ?  ?FTT: reports lives alone, uses a walker, will get PT eval ?  ?  ?Body mass index is 33.63 kg/m?., meet obesity criteria  ?  ? bilateral heal ulcers presents on admission: protecting foam dressing placed, home health RN for wound care as patient has impaired vision , also reports not able to reach heal to place dressing, f/u with pcp ?Pressure Injury 10/04/21 Heel Left Stage 3 -  Full thickness tissue loss. Subcutaneous fat may be visible but bone, tendon or muscle are NOT exposed. pink, red, white (Active)  ?10/04/21 1710  ?Location: Heel  ?Location Orientation: Left  ?Staging: Stage 3 -  Full thickness tissue loss. Subcutaneous fat may be visible but bone, tendon or muscle are NOT exposed.  ?Wound Description (Comments): pink, red, white  ?Present on Admission: Yes  ?   ?Pressure Injury 10/04/21 Heel Right Unstageable - Full thickness tissue loss in which the base of the injury is covered by slough (yellow, tan, gray, green or brown) and/or eschar (tan, brown or black) in the wound bed. brown/black (Active)  ?10/04/21 1710  ?Location: Heel  ?Location Orientation: Right  ?Staging: Unstageable - Full thickness tissue loss in which the base of the injury is covered by slough (yellow, tan, gray, green or brown) and/or eschar (tan, brown or black) in the wound bed.  ?Wound Description (Comments): brown/black  ?Present on Admission: Yes  ? ? ?Discharge Exam: ?BP (!) 121/48 (BP Location: Left Arm)   Pulse 73   Temp 98.1 ?F (36.7 ?C) (Oral)   Resp 18   Ht '5\' 1"'$  (1.549 m)   Wt 80.7 kg   SpO2 97%   BMI 33.63 kg/m?  ? ?General: NAD, aaox3, impaired vision at baseline ?Cardiovascular: RRR ?Respiratory: normal respiratory effort  ? ? ? ?Discharge Instructions   ? ? Diet - low sodium heart healthy   Complete by: As directed ?  ? Carb modified  ? Discharge wound care:   Complete by: As directed ?  ? Bilateral heal wound,   ?Duoderm Dressing changes twice a week, refer to pcp if wound get worse or show signs of infection  ? Increase activity slowly   Complete by: As directed ?  ? ?  ? ?Allergies as of 10/05/2021   ? ?   Reactions  ? Anesthetics, Amide Nausea And Vomiting  ? Chlorhexidine   ? Penicillins Diarrhea, Nausea And Vomiting, Other (See Comments)  ? Has patient had a PCN reaction causing immediate rash, facial/tongue/throat swelling, SOB or lightheadedness with hypotension: Yes ?Has patient had a PCN reaction causing severe rash involving mucus membranes or skin necrosis: No ?Has patient had a PCN reaction that required hospitalization No ?Has patient had a PCN reaction occurring within the last 10 years: Yes  ?If all of the above answers are "NO", then may proceed with Cephalosporin use.  ?  Buprenorphine Hcl Rash  ? Encainide Nausea And Vomiting  ? Metoclopramide Other (See Comments)  ? Currently no side effects, previously reportedly had dystonia, muscle rigidity, slurred speech, tongue swelling  ? ?  ? ?  ?Medication List  ?  ? ?STOP taking these medications   ? ?busPIRone 10 MG tablet ?Commonly known as: BUSPAR ?  ? ?  ? ?TAKE these medications   ? ?acetaminophen 500 MG tablet ?Commonly known as: TYLENOL ?Take 1,000 mg by mouth every 6 (six) hours as needed for mild pain. ?  ?aspirin EC 81 MG tablet ?Take 1 tablet (81 mg total) by mouth daily. ?  ?chlorhexidine 0.12 % solution ?Commonly known as: PERIDEX ?15 mLs by Mouth Rinse route 2 (two) times daily. ?  ?Contour Next Test test strip ?Generic drug: glucose blood ?TEST FOUR TIMES DAILY ?  ?furosemide 40 MG tablet ?Commonly known as: LASIX ?Take 1 tablet (40 mg total) by mouth daily. ?  ?gentamicin cream 0.1 % ?Commonly known as: GARAMYCIN ?Apply 1 application topically 2 (two) times daily. ?  ?insulin aspart 100 UNIT/ML injection ?Commonly known as: novoLOG ?Inject 100 Units into pump ?What changed:  ?how much to take ?how to take this ?when to take this ?additional  instructions ?  ?Insulin Syringe-Needle U-100 31G X 5/16" 0.5 ML Misc ?Commonly known as: BD Insulin Syringe Ultrafine ?1 each by Does not apply route 4 (four) times daily. ?  ?losartan 25 MG tablet ?Commonly known as: COZAAR ?Take 1 ta

## 2021-10-05 NOTE — Progress Notes (Signed)
Inpatient Diabetes Program Recommendations ? ?AACE/ADA: New Consensus Statement on Inpatient Glycemic Control (2015) ? ?Target Ranges:  Prepandial:   less than 140 mg/dL ?     Peak postprandial:   less than 180 mg/dL (1-2 hours) ?     Critically ill patients:  140 - 180 mg/dL  ? ?Lab Results  ?Component Value Date  ? GLUCAP 211 (H) 10/05/2021  ? HGBA1C 10.0 (H) 10/03/2021  ? ? ?Review of Glycemic Control ? Latest Reference Range & Units 10/04/21 08:54 10/04/21 10:10 10/04/21 11:30 10/04/21 14:44 10/04/21 17:12 10/04/21 19:58 10/05/21 08:12  ?Glucose-Capillary 70 - 99 mg/dL 116 (H) 141 (H) 151 (H) 190 (H) 288 (H) 278 (H) 211 (H)  ? ?Diabetes history: DM 1 ?Outpatient Diabetes medications: Omnipod insulin pump, Lantus 15 units when not using pump ?Insulin pump settings: ?Basal rates:  ?Midnight-5 AM = 0 . 75 units ?10 AM = 1.0 ?10:30 AM = 1.2 ?4 PM- midnight = 0.75 ?  ?I/C ratio 1:1 ?ISF: 35 ?target:120 ?correction over 120 ? ?Current orders for Inpatient glycemic control:  ?Semglee 25 units Daily ?Novolog 0-15 units tid  ?Novolog 3 units tid meal coverage ? ?Endocrinologist: Elayne Snare last visit 09/22/21 ?A1c 10% on 10/03/21 ? ?-   Increase Novolog meal coverage to 6 units tid if eating>50% of meals. ? ?Thanks, ? ?Tama Headings RN, MSN, BC-ADM ?Inpatient Diabetes Coordinator ?Team Pager [phone] (8a-5p) ?

## 2021-10-05 NOTE — Progress Notes (Signed)
Physical Therapy Evaluation ?Patient Details ?Name: [name] ?MRN: [id number] ?DOB: [birth year] ?Today's Date: 10/05/2021 ? ?History of Present Illness ? 56 y.o. female who presents to ed 10/03/21 after being found down at home. +DKA, metabolic encephalopathy, PMH significant of Type 1 DM, anxiety ,depression, diabetic gastroparesis, hypertension , HLD  ?Clinical Impression ?  ?Pt admitted secondary to problem above with deficits below. PTA patient was living alone using RW for ambulation. She reports inability to care for the wounds on bil heels because she cannot reach them.  Pt currently requires supervision due to lines. Anticipate patient will benefit from PT to address problems listed below.Will continue to follow acutely to maximize functional mobility independence and safety.   ?   ?   ? ?Recommendations for follow up therapy are one component of a multi-disciplinary discharge planning process, led by the attending physician.  Recommendations may be updated based on patient status, additional functional criteria and insurance authorization. ? ?Follow Up Recommendations Other (comment) (?HHRN for dressing changes to heels; ?aide) ? ?  ?Assistance Recommended at Discharge PRN  ?Patient can return home with the following ? Assistance with cooking/housework ? ?  ?Equipment Recommendations None recommended by PT  ?Recommendations for Other Services ?    ?  ?Functional Status Assessment Patient has had a recent decline in their functional status and demonstrates the ability to make significant improvements in function in a reasonable and predictable amount of time.  ? ?  ?Precautions / Restrictions Precautions ?Precautions: Fall ?Restrictions ?Weight Bearing Restrictions: No  ? ?  ? ?Mobility ? Bed Mobility ?Overal bed mobility: Modified Independent ?  ?  ?  ?  ?  ?  ?General bed mobility comments: HOB elevated with rail; incr time ?  ? ?Transfers ?Overall transfer level: Modified independent ?Equipment used:  Rolling walker (2 wheels) ?  ?  ?  ?  ?  ?  ?  ?General transfer comment: from EOB, BSC, recliner ?  ? ?Ambulation/Gait ?Ambulation/Gait assistance: Supervision ?Gait Distance (Feet): 15 Feet (toileted, 25) ?Assistive device: Rolling walker (2 wheels) ?Gait Pattern/deviations: Step-through pattern, Decreased dorsiflexion - right, Steppage ?  ?Gait velocity interpretation: 1.31 - 2.62 ft/sec, indicative of limited community ambulator ?  ?General Gait Details: pt independently manuevers RW; supervision for lines and safety only with no imbalance or unsafe use of rW noted ? ?Stairs ?  ?  ?  ?  ?  ? ?Wheelchair Mobility ?  ? ?Modified Rankin (Stroke Patients Only) ?  ? ?  ? ?Balance   ?  ?  ?  ?  ?  ?  ?  ?  ?  ?  ?  ?  ?  ?  ?  ?  ?  ?  ?   ? ? ? ?Pertinent Vitals/Pain Pain Assessment ?Pain Assessment: No/denies pain  ? ? ?Home Living Family/patient expects to be discharged to:: Private residence ?Living Arrangements: Alone ?Available Help at Discharge: Family;Available PRN/intermittently;Friend(s) ?Type of Home: Apartment ?Home Access: Level entry ?  ?  ?  ?Home Layout: One level ?Home Equipment: Conservation officer, nature (2 wheels);Tub bench;BSC/3in1 ?   ?  ?Prior Function Prior Level of Function : Needs assist ?  ?  ?  ?  ?  ?  ?Mobility Comments: uses RW at all times; pushes cart in grocery store (doesn't trust herself to drive electric cart ?ADLs Comments: uses tub bench; pushes up from counter to get off toilet bc BSC cannot fit in bathroom with tub bench there; takes  medicare transportation to doctor appointments; friends help with some transportation (i.e. grocery store) ?  ? ? ?Hand Dominance  ?   ? ?  ?Extremity/Trunk Assessment  ? Upper Extremity Assessment ?Upper Extremity Assessment: Generalized weakness (reports neuropathy in both hands) ?  ? ?Lower Extremity Assessment ?Lower Extremity Assessment: Generalized weakness;RLE deficits/detail ?RLE Deficits / Details: h/o foot drop and now with PF contracture with  inability to place foot flat on floor in stance ?RLE Sensation: history of peripheral neuropathy ?  ? ?Cervical / Trunk Assessment ?Cervical / Trunk Assessment: Other exceptions ?Cervical / Trunk Exceptions: overweight  ?Communication  ? Communication: No difficulties  ?Cognition Arousal/Alertness: Awake/alert ?Behavior During Therapy: [facility] for tasks assessed/performed ?Overall Cognitive Status: Within Functional Limits for tasks assessed ?  ?  ?  ?  ?  ?  ?  ?  ?  ?  ?  ?  ?  ?  ?  ?  ?  ?  ?  ? ?  ?General Comments General comments (skin integrity, edema, etc.): pt reports pressure ulcers on both heels that she cannot do dressing changes for ? ?  ?Exercises    ? ?Assessment/Plan  ?  ?PT Assessment Patient needs continued PT services  ?PT Problem List Decreased range of motion;Decreased balance;Decreased mobility;Impaired sensation;Obesity ? ?   ?  ?PT Treatment Interventions DME instruction;Gait training;Functional mobility training;Therapeutic activities;Patient/family education   ? ?PT Goals (Current goals can be found in the Care Plan section)  ?Acute Rehab PT Goals ?Patient Stated Goal: go home ASAP ?PT Goal Formulation: With patient ?Time For Goal Achievement: 10/12/21 ?Potential to Achieve Goals: Good ? ?  ?Frequency Min 3X/week ?  ? ? ?Co-evaluation   ?  ?  ?  ?  ? ? ?  ?AM-PAC PT "6 Clicks" Mobility  ?Outcome Measure Help needed turning from your back to your side while in a flat bed without using bedrails?: None ?Help needed moving from lying on your back to sitting on the side of a flat bed without using bedrails?: None ?Help needed moving to and from a bed to a chair (including a wheelchair)?: A Little ?Help needed standing up from a chair using your arms (e.g., wheelchair or bedside chair)?: A Little ?Help needed to walk in hospital room?: A Little ?Help needed climbing 3-5 steps with a railing? : A Lot ?6 Click Score: 19 ? ?  ?End of Session   ?Activity Tolerance: Patient tolerated treatment  well ?Patient left: in chair;with call bell/phone within reach ?Nurse Communication: Mobility status (safe to do sit to stand on her own; if not on telemetry could go to bathroom modified independent) ?PT Visit Diagnosis: Difficulty in walking, not elsewhere classified (R26.2) ?  ? ?Time: 8676-7209 ?PT Time Calculation (min) (ACUTE ONLY): 40 min ? ? ?Charges:   PT Evaluation ?$PT Eval Low Complexity: 1 Low ?PT Treatments ?$Gait Training: 8-22 mins ?$Therapeutic Activity: 8-22 mins ?  ?   ? ? ? ?Arby Barrette, PT ?Acute Rehabilitation Services  ?Pager [phone] ?Office [phone] ? ? ?Jeanie Cooks [place] ?10/05/2021, 10:40 AM ?

## 2021-10-06 ENCOUNTER — Telehealth: Payer: Self-pay

## 2021-10-06 NOTE — Telephone Encounter (Signed)
Transition Care Management Unsuccessful Follow-up Telephone Call ? ?Date of discharge and from where:  [facility] on 10/05/2021 ? ?Attempts:  1st Attempt ? ?Reason for unsuccessful TCM follow-up call:  Left voice message unable to reach at this time. Patient need to schedule appt.  ? ?  ?

## 2021-10-09 ENCOUNTER — Telehealth: Payer: Self-pay

## 2021-10-09 ENCOUNTER — Telehealth: Payer: Self-pay | Admitting: Family Medicine

## 2021-10-09 NOTE — Telephone Encounter (Signed)
Noted  

## 2021-10-09 NOTE — Telephone Encounter (Signed)
Transition Care Management Unsuccessful Follow-up Telephone Call ?  ?Date of discharge and from where:  [facility] on 10/05/2021 ?  ?Attempts:  2nd  Attempt ?  ?Reason for unsuccessful TCM follow-up call:  Left voice message unable to reach at this time. Patient need to schedule appt.  ?

## 2021-10-09 NOTE — Telephone Encounter (Signed)
Copied from [place] [id number]. Topic: General - Other ?>> Oct 09, 2021 11:12 AM McGill, Nelva Bush wrote: ?Reason for CRM: Langley Gauss from [facility] stated pt put off the start of care from last week to this week, and now she has been unable to locate pt. Left multiple messages pt did not answer. ? ?FYI for PCP. ?

## 2021-10-10 ENCOUNTER — Telehealth: Payer: Self-pay

## 2021-10-10 NOTE — Telephone Encounter (Signed)
Transition Care Management Unsuccessful Follow-up Telephone Call ? ?Date of discharge and from where:  10/05/2021, [facility]  ? ?Attempts:  3rd Attempt ? ?Reason for unsuccessful TCM follow-up call:  Left voice message on [phone].  Call back requested.  ? ?Letter also sent to patient requesting she contact [place] to schedule a hospital follow up appointment as we have not been able to reach her.  ? ? ? ?

## 2021-10-24 ENCOUNTER — Encounter: Payer: Self-pay | Admitting: Endocrinology

## 2021-10-26 ENCOUNTER — Other Ambulatory Visit: Payer: Self-pay | Admitting: Endocrinology

## 2021-10-27 ENCOUNTER — Telehealth: Payer: Self-pay | Admitting: Endocrinology

## 2021-10-27 DIAGNOSIS — E1040 Type 1 diabetes mellitus with diabetic neuropathy, unspecified: Secondary | ICD-10-CM

## 2021-10-27 MED ORDER — PREGABALIN 150 MG PO CAPS: ORAL_CAPSULE | ORAL | 2 refills | Status: DC

## 2021-10-27 NOTE — Telephone Encounter (Signed)
Rx changed and sent to preferred pharmacy ?

## 2021-10-27 NOTE — Addendum Note (Signed)
Addended by: Cinda Quest on: 10/27/2021 11:24 AM ? ? Modules accepted: Orders ? ?

## 2021-10-27 NOTE — Telephone Encounter (Signed)
Patient is calling to say that she gave the wrong pharmacy information yesterday (10/26/2021) for the ?pregabalin ?pregabalin (LYRICA) 150 MG capsule ?TAKE 1 CAPSULE(150 MG) BY MOUTH TWICE DAILY  ?  Patient needs the prescription sent to ?[facility] [place], [place] (Ph: [phone] ?

## 2021-11-01 ENCOUNTER — Other Ambulatory Visit: Payer: Self-pay

## 2021-11-01 DIAGNOSIS — E1040 Type 1 diabetes mellitus with diabetic neuropathy, unspecified: Secondary | ICD-10-CM

## 2021-11-01 MED ORDER — PREGABALIN 150 MG PO CAPS: ORAL_CAPSULE | ORAL | 2 refills | Status: DC

## 2021-11-01 NOTE — Telephone Encounter (Signed)
Please send in pregablin. Pateint would like refill. ?

## 2021-11-02 MED ORDER — PREGABALIN 150 MG PO CAPS: ORAL_CAPSULE | ORAL | 2 refills | Status: DC

## 2021-11-21 ENCOUNTER — Encounter: Payer: Self-pay | Admitting: Podiatry

## 2021-11-21 ENCOUNTER — Ambulatory Visit (INDEPENDENT_AMBULATORY_CARE_PROVIDER_SITE_OTHER): Payer: Medicare HMO | Admitting: Podiatry

## 2021-11-21 DIAGNOSIS — M79674 Pain in right toe(s): Secondary | ICD-10-CM | POA: Diagnosis not present

## 2021-11-21 DIAGNOSIS — B351 Tinea unguium: Secondary | ICD-10-CM | POA: Diagnosis not present

## 2021-11-21 DIAGNOSIS — E1142 Type 2 diabetes mellitus with diabetic polyneuropathy: Secondary | ICD-10-CM | POA: Diagnosis not present

## 2021-11-21 DIAGNOSIS — M79675 Pain in left toe(s): Secondary | ICD-10-CM | POA: Diagnosis not present

## 2021-11-21 DIAGNOSIS — R6889 Other general symptoms and signs: Secondary | ICD-10-CM | POA: Diagnosis not present

## 2021-11-21 DIAGNOSIS — L853 Xerosis cutis: Secondary | ICD-10-CM | POA: Diagnosis not present

## 2021-11-22 NOTE — Progress Notes (Signed)
Subjective:  Patient ID: [name], female    DOB: 30-Jun-1965,  MRN: [id number]  [name] presents to clinic today for at risk foot care with history of diabetic neuropathy and follow up of ulceration to the bilateral heels. Patient states she developed pressure sores on both heels after hospitalization. She was seen in our clinic a few months ago and was evaluated. Patient states her great toenails were trimmed too short and is unhappy with the short appearance as she is unable to get her toenails polished and wear sandals. She states they have not grown since the last trimming.  She was given a prescription for gentamicin cream on her last visit by Dr. Amalia Hailey and didn't feel it worked for her. She was prescribed SSD Cream by another provider and states she is out of the cream.  As far as her heels are concerned, [name] states she floats her heels on a pillow at home.  She has several other concerns stating she has an AFO for her right LE that she is unable to apply by herself. She lives alone. She is also unable to reach her feet.  Patient states blood glucose was 178 mg/dl today.  Last known HgA1c was in the 9% range.  PCP is [name], MD , and last visit was September 06, 2021.  Allergies  Allergen Reactions   Anesthetics, Amide Nausea And Vomiting   Chlorhexidine    Penicillins Diarrhea, Nausea And Vomiting and Other (See Comments)    Has patient had a PCN reaction causing immediate rash, facial/tongue/throat swelling, SOB or lightheadedness with hypotension: Yes Has patient had a PCN reaction causing severe rash involving mucus membranes or skin necrosis: No Has patient had a PCN reaction that required hospitalization No Has patient had a PCN reaction occurring within the last 10 years: Yes  If all of the above answers are "NO", then may proceed with Cephalosporin use.    Buprenorphine Hcl Rash   Encainide Nausea And Vomiting   Metoclopramide Other (See Comments)     Currently no side effects, previously reportedly had dystonia, muscle rigidity, slurred speech, tongue swelling    Review of Systems: Negative except as noted in the HPI.  Objective: No changes noted in today's physical examination. Objective:   Vascular Examination: Vascular status intact b/l with palpable pedal pulses. Pedal hair sparse b/l. CFT immediate b/l. No edema. No pain with calf compression b/l. Skin temperature gradient WNL b/l.   Neurological Examination: Protective sensation diminished with 10g monofilament b/l.  Dermatological Examination: Pedal skin with normal turgor, texture and tone b/l. Toenails 2-5 b/l thick, discolored, elongated with subungual debris and pain on dorsal palpation. Bilateral great toes with mycotic toenails which are short and cleaned of subungual debris. No hyperkeratotic lesions noted b/l.  Moderate dry skin noted posterior aspect of bilateral heels. No erythema, no edema, no drainage.  Musculoskeletal Examination: Muscle strength 5/5 to b/l LE. Muscle strength 4/5 to all lower extremity muscle groups bilaterally. Pes planus deformity noted bilateral LE.  Radiographs: None  Last A1c:      Latest Ref Rng & Units 10/03/2021   10:10 PM 08/09/2021   11:18 AM 03/09/2021   12:00 AM  Hemoglobin A1C  Hemoglobin-A1c 4.8 - 5.6 % 10.0   10.0   8.2          This result is from an external source.   Assessment/Plan: 1. Pain due to onychomycosis of toenails of both feet   2. Xerosis cutis  3. Diabetic peripheral neuropathy associated with type 2 diabetes mellitus ([place])     -Patient was evaluated and treated. All patient's and/or POA's questions/concerns answered on today's visit. -Discussed patient's concerns today. We did have her speak to our Clinical Manager. -Discussed pressure injury of bilateral heels. Recommended heel protector(s) from [employer]. Apply to both heels when in bed or when feet are exposed to pressure. Do not walk with heel protectors  on, as it poses a fall risk. -I gently filed her toenails with the dremel and she appeared satisfied with today's treatment.  I applied triple antibiotic ointment to both heels today. -For dry heels, patient to apply CeraVe Healing Ointment to both heels once daily. -Will contact PCP to see if we can get OT to or Home Health to assist with AFO issue. -Patient scheduled to see Dr. Lorenda Peck in one month for follow up of bilateral heel check. -We made sure patient's concerns/needs were heard and addressed on today. She appeared to be satisfied with the plan/providers going forward. -Patient/POA to call should there be question/concern in the interim.   Return in about 3 months (around 02/21/2022).  Marzetta Board, DPM

## 2021-11-24 ENCOUNTER — Ambulatory Visit: Payer: Medicare HMO | Admitting: Endocrinology

## 2021-12-05 ENCOUNTER — Ambulatory Visit: Payer: Medicare HMO | Admitting: Nutrition

## 2021-12-14 ENCOUNTER — Ambulatory Visit: Payer: Medicare HMO | Admitting: Family Medicine

## 2021-12-15 DIAGNOSIS — E1065 Type 1 diabetes mellitus with hyperglycemia: Secondary | ICD-10-CM | POA: Diagnosis not present

## 2021-12-19 ENCOUNTER — Ambulatory Visit: Payer: Medicare HMO | Admitting: Podiatry

## 2021-12-20 ENCOUNTER — Ambulatory Visit: Payer: Medicare HMO | Admitting: Podiatry

## 2021-12-20 ENCOUNTER — Encounter: Payer: Medicare HMO | Attending: Endocrinology | Admitting: Nutrition

## 2021-12-20 DIAGNOSIS — E1143 Type 2 diabetes mellitus with diabetic autonomic (poly)neuropathy: Secondary | ICD-10-CM | POA: Diagnosis not present

## 2021-12-20 DIAGNOSIS — K3184 Gastroparesis: Secondary | ICD-10-CM | POA: Insufficient documentation

## 2021-12-20 NOTE — Progress Notes (Unsigned)
Patient was trained on the use of the OmniPod 5 pump.  She is wearing the Dexcom CGM, but is not able to link to her phone.  She therefor can not be put in automated mode.  Pt. Very upset about this.  Settings were transferred from her Dash:  Basal rate:  MN: 0.75, 10AM; 1.0, 10:30AM: 1.2, 4PM: 0.75, 7PM: 0.80, I/C: 1, ISF: 30, timing 4 hours, target: MN: 120 with correction over 140.   Pt. Was shown how to bolus, and how/when to do correction doses.  She reported good understanding of this.  Discussed alerts, alarms, high blood sugar protocol.  She was reminded to give 3u for bfast and lunch, and 3-4u for supper, and 1-2u for snack.  She signed the checklist as understanding all topics with no final questions.   We discussed that if she can get a phone to download the Dexcom G6 mobile app, she can use the automated mode.  She will call her phone company to see what this will cost.  She had no final questions.

## 2021-12-20 NOTE — Patient Instructions (Signed)
Read over starter manual Call 800 number if questions. Call office if blood sugars drop below 70, or remain over 250.

## 2021-12-21 ENCOUNTER — Telehealth: Payer: Self-pay | Admitting: Nutrition

## 2021-12-21 NOTE — Telephone Encounter (Signed)
Patient reports no difficulty giving bolus last night acS.  Says FBS today was 100, and is now 57 without eating or bolusing anything this AM.   Discussed seeing if she can get an old apple phone from friend, or old android phone that is capable of downloading the Dexcom G6 app.  She agreed to do this, and call me if she is able to get one.   She does not have an appointment with Dr. Dwyane Dee.  I told her I would contact Dr. Dwyane Dee, and have someone contact her with when she is needed to be seen.  She had no questions for me.

## 2022-01-02 ENCOUNTER — Ambulatory Visit: Payer: Medicare HMO | Admitting: Endocrinology

## 2022-01-02 VITALS — BP 122/74 | HR 76 | Ht 61.0 in | Wt 180.0 lb

## 2022-01-02 DIAGNOSIS — E1065 Type 1 diabetes mellitus with hyperglycemia: Secondary | ICD-10-CM

## 2022-01-02 DIAGNOSIS — E1040 Type 1 diabetes mellitus with diabetic neuropathy, unspecified: Secondary | ICD-10-CM | POA: Diagnosis not present

## 2022-01-02 DIAGNOSIS — R6889 Other general symptoms and signs: Secondary | ICD-10-CM | POA: Diagnosis not present

## 2022-01-02 LAB — MICROALBUMIN / CREATININE URINE RATIO
Creatinine,U: 41.8 mg/dL
Microalb Creat Ratio: 17.9 mg/g (ref 0.0–30.0)
Microalb, Ur: 7.5 mg/dL — ABNORMAL HIGH (ref 0.0–1.9)

## 2022-01-02 LAB — POCT GLYCOSYLATED HEMOGLOBIN (HGB A1C): Hemoglobin A1C: 8.8 % — AB (ref 4.0–5.6)

## 2022-01-02 MED ORDER — OMNIPOD DASH PODS (GEN 4) MISC: 3 refills | Status: DC

## 2022-01-02 NOTE — Progress Notes (Signed)
Patient ID: [name], female   DOB: 1965/12/25, 56 y.o.   MRN: [id number]           Reason for Appointment : Follow-up for Type 1 Diabetes  History of Present Illness          Diagnosis: Type 1 diabetes mellitus, date of diagnosis: 1977         Previous history:  She has mostly been followed by primary care physicians for her diabetes with consistently poor control She has had periodic episodes of ketoacidosis Previous insulin regimen:  BASAL insulin: LANTUS 15 units in a.m. MEALTIME insulin: Novolog variable doses: Usually 6-8 at breakfast, 2-3 at lunch and dinner   OmniPod insulin pump since 07/07/2019  Basal rates:  MN: 0.75, 10AM; 1.0, 10:30AM: 1.2, 4PM: 0.75, 7PM: 0.80,  I/C: 1, ISF: 30, timing 4 hours, target: MN: 120 with correction over 140  Recent history:   A1c is now 8.8   Current management, blood sugar patterns and problems identified:   She had an episode of ketoacidosis in April Although she has received OmniPod 5 pump she finally started using this only about 2 weeks ago However because of her incompatibility of the app on the phone she cannot use the Dexcom app and link to her OmniPod Unable to review her bolus management since the download was not possible She now says that she is only bolusing when her blood sugars are over 150 With this she has significant postprandial hyperglycemia especially seen after breakfast and periodically later in the day Also not clear if she takes correction boluses after blood sugar goes up with food Unclear what amount of boluses she is taking or whether she is using the bolus calculator on the pump She is afraid of low blood sugars also and likely over response to this Tends to have relatively low blood sugars at times before she gets to eat breakfast around 10 or 11 AM   Review of her 2-week Dexcom download shows the following recent analysis  She has HYPERGLYCEMIC episodes almost daily although not at the consistent  time every day  Generally hyperglycemic episodes may be preceded by low normal or slightly low readings HIGHEST blood sugars are varying to be late in the evenings after 9-10 PM with some carryover into the early part of the night  OVERNIGHT blood sugars are much higher in the first week of the data but has had some significantly better readings at least after 3 AM in the last week or so POSTPRANDIAL readings are markedly increased after breakfast especially in this last week Also postprandial readings are variably high in the evenings after dinner and may be persistently high into the late night HYPOGLYCEMIA is normal but may have transiently low sugars around 11 AM or 3-4 PM High VARIABILITY is present with CV 38  Data for the last 2 weeks GMI = 8.2  CGM use % of time 93  2-week average/GV 205  Time in range 40       %  % Time Above 180 34  % Time above 250 24  % Time Below 70 1   Previously:  CGM use % of time   2-week average/GV 248/39  Time in range      27%  % Time Above 180 25  % Time above 250 47  % Time Below 70 1     Symptoms of hypoglycemia: Weakness, sometimes none Treatment of hypoglycemia: Snacks  Self-care: The diet that the patient has been following is: None  Mealtimes are: Breakfast egg, cheese, sausage, occasionally grits, toast;   Lunch: Sandwich or only snacks, dinner: baked chricken, rice, potatoes                Dietician consultation: Most recent: years ago.         CDE consultation: 11/2021  Wt Readings from Last 3 Encounters:  01/02/22 180 lb (81.6 kg)  10/04/21 178 lb (80.7 kg)  09/22/21 179 lb (81.2 kg)   Diabetes labs:  Lab Results  Component Value Date   HGBA1C 8.8 (A) 01/02/2022   HGBA1C 10.0 (H) 10/03/2021   HGBA1C 10.0 (A) 08/09/2021   Lab Results  Component Value Date   MICROALBUR 3.7 (H) 06/13/2020   LDLCALC 105 (H) 08/09/2021   CREATININE 1.41 (H) 10/05/2021    Lab Results  Component Value Date   MICRALBCREAT 4.3  06/13/2020   MICRALBCREAT 17.1 04/28/2018     Allergies as of 01/02/2022       Reactions   Anesthetics, Amide Nausea And Vomiting   Chlorhexidine    Penicillins Diarrhea, Nausea And Vomiting, Other (See Comments)   Has patient had a PCN reaction causing immediate rash, facial/tongue/throat swelling, SOB or lightheadedness with hypotension: Yes Has patient had a PCN reaction causing severe rash involving mucus membranes or skin necrosis: No Has patient had a PCN reaction that required hospitalization No Has patient had a PCN reaction occurring within the last 10 years: Yes  If all of the above answers are "NO", then may proceed with Cephalosporin use.   Buprenorphine Hcl Rash   Encainide Nausea And Vomiting   Metoclopramide Other (See Comments)   Currently no side effects, previously reportedly had dystonia, muscle rigidity, slurred speech, tongue swelling        Medication List        Accurate as of January 02, 2022  2:09 PM. If you have any questions, ask your nurse or doctor.          acetaminophen 500 MG tablet Commonly known as: TYLENOL Take 1,000 mg by mouth every 6 (six) hours as needed for mild pain.   aspirin EC 81 MG tablet Take 1 tablet (81 mg total) by mouth daily.   chlorhexidine 0.12 % solution Commonly known as: PERIDEX 15 mLs by Mouth Rinse route 2 (two) times daily.   Contour Next Test test strip Generic drug: glucose blood TEST FOUR TIMES DAILY   furosemide 40 MG tablet Commonly known as: LASIX Take 1 tablet (40 mg total) by mouth daily.   gentamicin cream 0.1 % Commonly known as: GARAMYCIN Apply 1 application topically 2 (two) times daily.   insulin aspart 100 UNIT/ML injection Commonly known as: novoLOG Inject 100 Units into pump What changed:  how much to take how to take this when to take this additional instructions   Insulin Syringe-Needle U-100 31G X 5/16" 0.5 ML Misc Commonly known as: BD Insulin Syringe Ultrafine 1 each by Does  not apply route 4 (four) times daily.   losartan 25 MG tablet Commonly known as: COZAAR Take 1 tablet (25 mg total) by mouth daily. Hold this medication if your systolic blood pressure ( top number) s less than 100.  Please call your doctor is your systolic blood pressure is less than 100   metoCLOPramide 10 MG tablet Commonly known as: REGLAN TAKE 1 TABLET(10 MG) BY MOUTH THREE TIMES DAILY BEFORE MEALS What changed:  how much to take  how to take this when to take this additional instructions   metoprolol tartrate 25 MG tablet Commonly known as: LOPRESSOR Take 0.5 tablets (12.5 mg total) by mouth 2 (two) times daily. Please check your blood pressure twice a day, please hold this medication if your systolic blood pressure ( top number) is less than 100, please call your doctor if your systolic blood pressure is less than 100.   Omnipod 5 G6 Pod (Gen 5) Misc 1 Device by Does not apply route every 3 (three) days.   Omnipod DASH Pods (Gen 4) Misc CHANGE DASH PODS EVERY 48 HOURS AS DIRECTED   ondansetron 4 MG tablet Commonly known as: Zofran Take 1 tablet (4 mg total) by mouth every 8 (eight) hours as needed for nausea or vomiting.   pregabalin 150 MG capsule Commonly known as: LYRICA TAKE 1 CAPSULE(150 MG) BY MOUTH TWICE DAILY   rosuvastatin 10 MG tablet Commonly known as: CRESTOR Take 1 tablet (10 mg total) by mouth daily.        Allergies:  Allergies  Allergen Reactions   Anesthetics, Amide Nausea And Vomiting   Chlorhexidine    Penicillins Diarrhea, Nausea And Vomiting and Other (See Comments)    Has patient had a PCN reaction causing immediate rash, facial/tongue/throat swelling, SOB or lightheadedness with hypotension: Yes Has patient had a PCN reaction causing severe rash involving mucus membranes or skin necrosis: No Has patient had a PCN reaction that required hospitalization No Has patient had a PCN reaction occurring within the last 10 years: Yes  If all of  the above answers are "NO", then may proceed with Cephalosporin use.    Buprenorphine Hcl Rash   Encainide Nausea And Vomiting   Metoclopramide Other (See Comments)    Currently no side effects, previously reportedly had dystonia, muscle rigidity, slurred speech, tongue swelling    Past Medical History:  Diagnosis Date   AKI (acute kidney injury) ([place])    Anemia, iron deficiency    Anxiety and depression 05/18/2015   CAD (coronary artery disease), native coronary artery 01/11/2018   s/p CABG - Non-STEMI with severe three-vessel disease noted July 2019   Depression    Diabetic gastroparesis ([place])    Diabetic neuropathy, type I diabetes mellitus ([place]) 05/18/2015   DKA, type 1 ([place]) 03/18/2016   Essential hypertension    Gastroparesis    GERD (gastroesophageal reflux disease)    HLD (hyperlipidemia)    MDD (major depressive disorder), single episode, severe , no psychosis ([place])    Tardive dyskinesia    Vitamin B12 deficiency 08/16/2015    Past Surgical History:  Procedure Laterality Date   CARDIAC SURGERY     COLONOSCOPY  09/27/2014   at [place] N/A 01/14/2018   Procedure: CORONARY ARTERY BYPASS GRAFTING (CABG)X3, RIGHT AND LEFT SAPHENOUS VEIN HARVEST, MAMMARY TAKE DOWN. MAMMARY TO LAD, SVG TO PD, SVG TO DISTAL CIRC.;  Surgeon: Grace Isaac, MD;  Location: [facility];  Service: Open Heart Surgery;  Laterality: N/A;   ESOPHAGOGASTRODUODENOSCOPY  09/27/2014   at [facility], Dr Rolan Lipa. biospy neg for celiac, neg for H pylori.    EYE SURGERY     gailstones     INTRAMEDULLARY (IM) NAIL INTERTROCHANTERIC Right 05/10/2019   Procedure: INTRAMEDULLARY (IM) NAIL INTERTROCHANTRIC;  Surgeon: Lovell Sheehan, MD;  Location: [facility];  Service: Orthopedics;  Laterality: Right;   IR FLUORO GUIDE CV LINE RIGHT  02/01/2017   IR FLUORO GUIDE CV LINE RIGHT  03/06/2017   IR FLUORO GUIDE CV LINE RIGHT  03/25/2017   IR GENERIC HISTORICAL  01/24/2016   IR FLUORO GUIDE CV  LINE RIGHT 01/24/2016 Darrell K Allred, PA-C [facility]   IR GENERIC HISTORICAL  01/24/2016   IR US GUIDE VASC ACCESS RIGHT 01/24/2016 Darrell K Allred, PA-C [facility]   IR US GUIDE VASC ACCESS RIGHT  02/01/2017   IR US GUIDE VASC ACCESS RIGHT  03/06/2017   IR US GUIDE VASC ACCESS RIGHT  03/25/2017   IR US GUIDE VASC ACCESS RIGHT  01/20/2019   IR VENIPUNCTURE 75YRS/OLDER BY MD  01/20/2019   LEFT HEART CATH AND CORONARY ANGIOGRAPHY N/A 01/07/2018   Procedure: LEFT HEART CATH AND CORONARY ANGIOGRAPHY;  Surgeon: Troy Sine, MD;  Location: [facility];  Service: Cardiovascular;  Laterality: N/A;   POSTERIOR VITRECTOMY AND MEMBRANE PEEL-LEFT EYE  09/28/2002   POSTERIOR VITRECTOMY AND MEMBRANE PEEL-RIGHT EYE  03/16/2002   RETINAL DETACHMENT SURGERY     TEE WITHOUT CARDIOVERSION N/A 01/14/2018   Procedure: TRANSESOPHAGEAL ECHOCARDIOGRAM (TEE);  Surgeon: Grace Isaac, MD;  Location: [facility];  Service: Open Heart Surgery;  Laterality: N/A;    Family History  Problem Relation Age of Onset   Cystic fibrosis Mother    Hypertension Father    Diabetes Brother    Hypertension Maternal Grandmother     Social History:  reports that she has never smoked. She has never used smokeless tobacco. She reports current drug use. Drug: Marijuana. She reports that she does not drink alcohol.      Review of Systems      Lipids: She had been taking Crestor 10 mg prescribed by cardiologist   Lab Results  Component Value Date   CHOL 183 08/09/2021   HDL 57.50 08/09/2021   LDLCALC 105 (H) 08/09/2021   TRIG 104.0 08/09/2021   CHOLHDL 3 08/09/2021     DIABETES COMPLICATIONS: Gastroparesis treated with Zofran as needed Also on Reglan with fairly good relief of symptoms   On Lyrica 150 mg for peripheral neuropathy with pain generally relieved by the Lyrica   Physical Examination:  BP 122/74 (BP Location: Left Arm, Patient Position: Sitting, Cuff Size: Normal)   Pulse 76   Ht '5\' 1"'$  (1.549 m)    Wt 180 lb (81.6 kg)   SpO2 95%   BMI 34.01 kg/m       ASSESSMENT:  Diabetes type 1, long-standing with multiple complications  She is currently on her OmniPod pump  See history of present illness for detailed discussion of current diabetes management, blood sugar patterns and problems identified  Her A1c is now 8.8 compared to 10%  Blood sugars from her Dexcom were reviewed Insulin pump download was not possible since she is not able to use the OmniPod 5 app on her phone and she did not bring her previous PDM Her blood sugar patterns were reviewed and she is still having poor control with only 41% within target range She has not had any excessive hypoglycemia Her bolus management is inadequate with missed boluses, not understanding the need to bolus for all meals and snacks with carbohydrate and likely late boluses   PLAN:   Discussed that she needs to bolus for all meals before starting to eat regardless of P[place] blood sugar She will also explained that if her blood sugar is below target the pump will reduce her total dose With this her postprandial readings will be better Currently difficult to increase her basal  rates without first getting her postprandial readings controlled  Will adjust her basal rates as follows  12 AM-9 AM = 0.75, 9 AM = 0.9, 12 noon = 1 point 2 and 4 PM = 0.85 She will program this on the OmniPod 4 PDM that she has at home and not use the OmniPod 5 unless she has a compatible phone She was also given the phone number for the local OmniPod representative to troubleshoot any difficulties  She will call if she has any further tendency to low sugars  Labs to be rechecked on the next visit  Urine microalbumin to be checked as it is overdue  There are no Patient Instructions on file for this visit.    Elayne Snare 01/02/2022, 2:09 PM   Total visit time including counseling on diabetes management and use of the insulin pump = 30 minutes  Note: This  note was prepared with Dragon voice recognition system technology. Any transcriptional errors that result from this process are unintentional.

## 2022-01-05 ENCOUNTER — Encounter: Payer: Self-pay | Admitting: Endocrinology

## 2022-01-06 ENCOUNTER — Encounter (HOSPITAL_COMMUNITY): Payer: Self-pay

## 2022-01-06 ENCOUNTER — Inpatient Hospital Stay (HOSPITAL_COMMUNITY)
Admission: EM | Admit: 2022-01-06 | Discharge: 2022-01-10 | DRG: 638 | Disposition: A | Discharge status: Home / Self Care | Payer: Medicare HMO | Attending: Student in an Organized Health Care Education/Training Program | Admitting: Student in an Organized Health Care Education/Training Program

## 2022-01-06 ENCOUNTER — Emergency Department (HOSPITAL_COMMUNITY): Payer: Medicare HMO

## 2022-01-06 DIAGNOSIS — R0689 Other abnormalities of breathing: Secondary | ICD-10-CM | POA: Diagnosis not present

## 2022-01-06 DIAGNOSIS — I252 Old myocardial infarction: Secondary | ICD-10-CM

## 2022-01-06 DIAGNOSIS — I1310 Hypertensive heart and chronic kidney disease without heart failure, with stage 1 through stage 4 chronic kidney disease, or unspecified chronic kidney disease: Secondary | ICD-10-CM | POA: Diagnosis not present

## 2022-01-06 DIAGNOSIS — Z794 Long term (current) use of insulin: Secondary | ICD-10-CM

## 2022-01-06 DIAGNOSIS — T383X6A Underdosing of insulin and oral hypoglycemic [antidiabetic] drugs, initial encounter: Secondary | ICD-10-CM | POA: Diagnosis present

## 2022-01-06 DIAGNOSIS — E1043 Type 1 diabetes mellitus with diabetic autonomic (poly)neuropathy: Secondary | ICD-10-CM | POA: Diagnosis not present

## 2022-01-06 DIAGNOSIS — Z888 Allergy status to other drugs, medicaments and biological substances status: Secondary | ICD-10-CM

## 2022-01-06 DIAGNOSIS — R000 Tachycardia, unspecified: Secondary | ICD-10-CM | POA: Diagnosis not present

## 2022-01-06 DIAGNOSIS — I2510 Atherosclerotic heart disease of native coronary artery without angina pectoris: Secondary | ICD-10-CM | POA: Diagnosis present

## 2022-01-06 DIAGNOSIS — N1832 Chronic kidney disease, stage 3b: Secondary | ICD-10-CM | POA: Diagnosis not present

## 2022-01-06 DIAGNOSIS — F411 Generalized anxiety disorder: Secondary | ICD-10-CM | POA: Diagnosis not present

## 2022-01-06 DIAGNOSIS — Z880 Allergy status to penicillin: Secondary | ICD-10-CM | POA: Diagnosis not present

## 2022-01-06 DIAGNOSIS — E1110 Type 2 diabetes mellitus with ketoacidosis without coma: Secondary | ICD-10-CM | POA: Diagnosis present

## 2022-01-06 DIAGNOSIS — Z79899 Other long term (current) drug therapy: Secondary | ICD-10-CM

## 2022-01-06 DIAGNOSIS — N179 Acute kidney failure, unspecified: Secondary | ICD-10-CM | POA: Diagnosis present

## 2022-01-06 DIAGNOSIS — I5030 Unspecified diastolic (congestive) heart failure: Secondary | ICD-10-CM | POA: Diagnosis not present

## 2022-01-06 DIAGNOSIS — Z91128 Patient's intentional underdosing of medication regimen for other reason: Secondary | ICD-10-CM

## 2022-01-06 DIAGNOSIS — E860 Dehydration: Secondary | ICD-10-CM | POA: Diagnosis present

## 2022-01-06 DIAGNOSIS — R0603 Acute respiratory distress: Secondary | ICD-10-CM | POA: Diagnosis present

## 2022-01-06 DIAGNOSIS — E1069 Type 1 diabetes mellitus with other specified complication: Secondary | ICD-10-CM

## 2022-01-06 DIAGNOSIS — E1065 Type 1 diabetes mellitus with hyperglycemia: Secondary | ICD-10-CM

## 2022-01-06 DIAGNOSIS — I10 Essential (primary) hypertension: Secondary | ICD-10-CM

## 2022-01-06 DIAGNOSIS — E0810 Diabetes mellitus due to underlying condition with ketoacidosis without coma: Secondary | ICD-10-CM | POA: Diagnosis not present

## 2022-01-06 DIAGNOSIS — E1010 Type 1 diabetes mellitus with ketoacidosis without coma: Secondary | ICD-10-CM | POA: Diagnosis not present

## 2022-01-06 DIAGNOSIS — Z951 Presence of aortocoronary bypass graft: Secondary | ICD-10-CM | POA: Diagnosis not present

## 2022-01-06 DIAGNOSIS — R739 Hyperglycemia, unspecified: Secondary | ICD-10-CM | POA: Diagnosis not present

## 2022-01-06 DIAGNOSIS — D509 Iron deficiency anemia, unspecified: Secondary | ICD-10-CM | POA: Diagnosis present

## 2022-01-06 DIAGNOSIS — K219 Gastro-esophageal reflux disease without esophagitis: Secondary | ICD-10-CM | POA: Diagnosis present

## 2022-01-06 DIAGNOSIS — Z8249 Family history of ischemic heart disease and other diseases of the circulatory system: Secondary | ICD-10-CM | POA: Diagnosis not present

## 2022-01-06 DIAGNOSIS — I129 Hypertensive chronic kidney disease with stage 1 through stage 4 chronic kidney disease, or unspecified chronic kidney disease: Secondary | ICD-10-CM | POA: Diagnosis not present

## 2022-01-06 DIAGNOSIS — Z833 Family history of diabetes mellitus: Secondary | ICD-10-CM

## 2022-01-06 DIAGNOSIS — I130 Hypertensive heart and chronic kidney disease with heart failure and stage 1 through stage 4 chronic kidney disease, or unspecified chronic kidney disease: Secondary | ICD-10-CM | POA: Diagnosis present

## 2022-01-06 DIAGNOSIS — I5032 Chronic diastolic (congestive) heart failure: Secondary | ICD-10-CM | POA: Diagnosis not present

## 2022-01-06 DIAGNOSIS — I959 Hypotension, unspecified: Secondary | ICD-10-CM | POA: Diagnosis not present

## 2022-01-06 DIAGNOSIS — Z7982 Long term (current) use of aspirin: Secondary | ICD-10-CM | POA: Diagnosis not present

## 2022-01-06 DIAGNOSIS — E1040 Type 1 diabetes mellitus with diabetic neuropathy, unspecified: Secondary | ICD-10-CM

## 2022-01-06 DIAGNOSIS — I4581 Long QT syndrome: Secondary | ICD-10-CM | POA: Diagnosis not present

## 2022-01-06 DIAGNOSIS — E1021 Type 1 diabetes mellitus with diabetic nephropathy: Secondary | ICD-10-CM | POA: Diagnosis not present

## 2022-01-06 DIAGNOSIS — R0682 Tachypnea, not elsewhere classified: Secondary | ICD-10-CM | POA: Diagnosis not present

## 2022-01-06 DIAGNOSIS — K3184 Gastroparesis: Secondary | ICD-10-CM | POA: Diagnosis present

## 2022-01-06 DIAGNOSIS — R410 Disorientation, unspecified: Secondary | ICD-10-CM | POA: Diagnosis not present

## 2022-01-06 DIAGNOSIS — N183 Chronic kidney disease, stage 3 unspecified: Secondary | ICD-10-CM | POA: Diagnosis not present

## 2022-01-06 DIAGNOSIS — E785 Hyperlipidemia, unspecified: Secondary | ICD-10-CM | POA: Diagnosis present

## 2022-01-06 LAB — CBC WITH DIFFERENTIAL/PLATELET
Abs Immature Granulocytes: 0.08 10*3/uL — ABNORMAL HIGH (ref 0.00–0.07)
Basophils Absolute: 0 10*3/uL (ref 0.0–0.1)
Basophils Relative: 0 %
Eosinophils Absolute: 0 10*3/uL (ref 0.0–0.5)
Eosinophils Relative: 0 %
HCT: 39.3 % (ref 36.0–46.0)
Hemoglobin: 11.7 g/dL — ABNORMAL LOW (ref 12.0–15.0)
Immature Granulocytes: 1 %
Lymphocytes Relative: 5 %
Lymphs Abs: 0.8 10*3/uL (ref 0.7–4.0)
MCH: 25.7 pg — ABNORMAL LOW (ref 26.0–34.0)
MCHC: 29.8 g/dL — ABNORMAL LOW (ref 30.0–36.0)
MCV: 86.2 fL (ref 80.0–100.0)
Monocytes Absolute: 0.7 10*3/uL (ref 0.1–1.0)
Monocytes Relative: 4 %
Neutro Abs: 15.7 10*3/uL — ABNORMAL HIGH (ref 1.7–7.7)
Neutrophils Relative %: 90 %
Platelets: 358 10*3/uL (ref 150–400)
RBC: 4.56 MIL/uL (ref 3.87–5.11)
RDW: 14.9 % (ref 11.5–15.5)
WBC: 17.3 10*3/uL — ABNORMAL HIGH (ref 4.0–10.5)
nRBC: 0 % (ref 0.0–0.2)

## 2022-01-06 LAB — I-STAT VENOUS BLOOD GAS, ED
Acid-base deficit: 20 mmol/L — ABNORMAL HIGH (ref 0.0–2.0)
Bicarbonate: 7.6 mmol/L — ABNORMAL LOW (ref 20.0–28.0)
Calcium, Ion: 1.22 mmol/L (ref 1.15–1.40)
HCT: 35 % — ABNORMAL LOW (ref 36.0–46.0)
Hemoglobin: 11.9 g/dL — ABNORMAL LOW (ref 12.0–15.0)
O2 Saturation: 94 %
Potassium: 4.8 mmol/L (ref 3.5–5.1)
Sodium: 138 mmol/L (ref 135–145)
TCO2: 8 mmol/L — ABNORMAL LOW (ref 22–32)
pCO2, Ven: 22.3 mmHg — ABNORMAL LOW (ref 44–60)
pH, Ven: 7.14 — CL (ref 7.25–7.43)
pO2, Ven: 90 mmHg — ABNORMAL HIGH (ref 32–45)

## 2022-01-06 LAB — CBG MONITORING, ED
Glucose-Capillary: >600 mg/dL (ref 70–99)
Glucose-Capillary: >600 mg/dL (ref 70–99)
Glucose-Capillary: >600 mg/dL (ref 70–99)
Glucose-Capillary: >600 mg/dL (ref 70–99)
Glucose-Capillary: 572 mg/dL (ref 70–99)
Glucose-Capillary: 509 mg/dL (ref 70–99)

## 2022-01-06 LAB — I-STAT BETA HCG BLOOD, ED (MC, WL, AP ONLY): I-stat hCG, quantitative: <5 m[IU]/mL (ref <5)

## 2022-01-06 LAB — BASIC METABOLIC PANEL
Anion gap: 29 — ABNORMAL HIGH (ref 5–15)
BUN: 36 mg/dL — ABNORMAL HIGH (ref 6–20)
CO2: 10 mmol/L — ABNORMAL LOW (ref 22–32)
Calcium: 9.6 mg/dL (ref 8.9–10.3)
Chloride: 98 mmol/L (ref 98–111)
Creatinine, Ser: 2.34 mg/dL — ABNORMAL HIGH (ref 0.44–1.00)
GFR, Estimated: 24 mL/min — ABNORMAL LOW (ref >60)
Glucose, Bld: 733 mg/dL (ref 70–99)
Potassium: 5.5 mmol/L — ABNORMAL HIGH (ref 3.5–5.1)
Sodium: 137 mmol/L (ref 135–145)

## 2022-01-06 LAB — TROPONIN I (HIGH SENSITIVITY)
Troponin I (High Sensitivity): 11 ng/L (ref <18)
Troponin I (High Sensitivity): 13 ng/L (ref <18)

## 2022-01-06 LAB — BETA-HYDROXYBUTYRIC ACID: Beta-Hydroxybutyric Acid: >8 mmol/L — ABNORMAL HIGH (ref 0.05–0.27)

## 2022-01-06 MED ORDER — INSULIN REGULAR(HUMAN) IN NACL 100-0.9 UT/100ML-% IV SOLN
INTRAVENOUS | Status: DC
  Administered 2022-01-06: 9 [IU]/h via INTRAVENOUS
  Administered 2022-01-06: 8.5 [IU]/h via INTRAVENOUS
  Filled 2022-01-06 (×2): qty 100

## 2022-01-06 MED ORDER — LACTATED RINGERS IV SOLN: INTRAVENOUS | Status: DC

## 2022-01-06 MED ORDER — POTASSIUM CHLORIDE 10 MEQ/100ML IV SOLN
10.0000 meq | INTRAVENOUS | Status: AC
  Administered 2022-01-06 (×2): 10 meq via INTRAVENOUS
  Filled 2022-01-06 (×2): qty 100

## 2022-01-06 MED ORDER — LACTATED RINGERS IV BOLUS
20.0000 mL/kg | Freq: Once | INTRAVENOUS | Status: AC
  Administered 2022-01-06: 1632 mL via INTRAVENOUS

## 2022-01-06 MED ORDER — DEXTROSE 50 % IV SOLN: 0.0000 mL | INTRAVENOUS | Status: DC | PRN

## 2022-01-06 MED ORDER — LACTATED RINGERS IV BOLUS: 20.0000 mL/kg | Freq: Once | INTRAVENOUS | Status: DC

## 2022-01-06 MED ORDER — MORPHINE SULFATE (PF) 4 MG/ML IV SOLN
4.0000 mg | Freq: Once | INTRAVENOUS | Status: AC
  Administered 2022-01-06: 4 mg via INTRAVENOUS
  Filled 2022-01-06: qty 1

## 2022-01-06 MED ORDER — DEXTROSE IN LACTATED RINGERS 5 % IV SOLN: INTRAVENOUS | Status: DC

## 2022-01-06 MED ORDER — INSULIN REGULAR(HUMAN) IN NACL 100-0.9 UT/100ML-% IV SOLN
INTRAVENOUS | Status: DC
  Administered 2022-01-06: 9 [IU]/h via INTRAVENOUS

## 2022-01-06 MED ORDER — RIVAROXABAN 10 MG PO TABS
10.0000 mg | ORAL_TABLET | Freq: Every day | ORAL | Status: DC
  Administered 2022-01-06 – 2022-01-10 (×5): 10 mg via ORAL
  Filled 2022-01-06 (×5): qty 1

## 2022-01-06 MED ORDER — ACETAMINOPHEN 500 MG PO TABS
1000.0000 mg | ORAL_TABLET | Freq: Four times a day (QID) | ORAL | Status: DC | PRN
Start: 2022-01-06 — End: 2022-01-07

## 2022-01-06 MED ORDER — LACTATED RINGERS IV BOLUS
1000.0000 mL | Freq: Once | INTRAVENOUS | Status: AC
  Administered 2022-01-06: 1000 mL via INTRAVENOUS

## 2022-01-06 MED ORDER — DEXTROSE IN LACTATED RINGERS 5 % IV SOLN
INTRAVENOUS | Status: DC
Start: 2022-01-06 — End: 2022-01-08

## 2022-01-06 MED ORDER — ONDANSETRON HCL 4 MG/2ML IJ SOLN
4.0000 mg | Freq: Once | INTRAMUSCULAR | Status: AC
  Administered 2022-01-06: 4 mg via INTRAVENOUS
  Filled 2022-01-06: qty 2

## 2022-01-06 NOTE — ED Provider Notes (Signed)
[facility] EMERGENCY DEPARTMENT Provider Note   CSN: [id number] Arrival date & time: 01/06/22  1747     History  Chief Complaint  Patient presents with   Hyperglycemia    [name] is a 56 y.o. female.  Pt is a 56 yo female with a pmhx significant for DM1, htn, hld, GERD, CAD s/p CABG, depression, and anxiety.  Pt said she has had elevated blood sugars since yesterday.  She did not take her insulin today.  She feels nauseous and has abd pain.       Home Medications Prior to Admission medications   Medication Sig Start Date End Date Taking? Authorizing Provider  acetaminophen (TYLENOL) 500 MG tablet Take 1,000 mg by mouth every 6 (six) hours as needed for mild pain.    [provider]  aspirin EC 81 MG tablet Take 1 tablet (81 mg total) by mouth daily. 06/03/18   Nahser, Wonda Cheng, MD  chlorhexidine (PERIDEX) 0.12 % solution 15 mLs by Mouth Rinse route 2 (two) times daily. 09/20/21   [provider]  furosemide (LASIX) 40 MG tablet Take 1 tablet (40 mg total) by mouth daily. 07/29/20   Nahser, Wonda Cheng, MD  gentamicin cream (GARAMYCIN) 0.1 % Apply 1 application topically 2 (two) times daily. 08/21/21   Edrick Kins, DPM  glucose blood (CONTOUR NEXT TEST) test strip TEST FOUR TIMES DAILY 02/20/21   Elayne Snare, MD  insulin aspart (NOVOLOG) 100 UNIT/ML injection Inject 100 Units into pump Patient taking differently: Inject 100 Units into the skin continuous. Lasts 3 days 07/27/21   Elayne Snare, MD  Insulin Disposable Pump (OMNIPOD 5 G6 POD, GEN 5,) MISC 1 Device by Does not apply route every 3 (three) days. 08/09/21   Elayne Snare, MD  Insulin Disposable Pump (OMNIPOD DASH PODS, GEN 4,) MISC CHANGE DASH PODS EVERY 48 HOURS AS DIRECTED 01/02/22   Elayne Snare, MD  Insulin Syringe-Needle U-100 (BD INSULIN SYRINGE ULTRAFINE) 31G X 5/16" 0.5 ML MISC 1 each by Does not apply route 4 (four) times daily. 03/20/21   Elayne Snare, MD  losartan (COZAAR) 25 MG  tablet Take 1 tablet (25 mg total) by mouth daily. Hold this medication if your systolic blood pressure ( top number) s less than 100.  Please call your doctor is your systolic blood pressure is less than 100 10/05/21   Florencia Reasons, MD  metoCLOPramide (REGLAN) 10 MG tablet TAKE 1 TABLET(10 MG) BY MOUTH THREE TIMES DAILY BEFORE MEALS Patient taking differently: Take 10 mg by mouth 3 (three) times daily before meals. 08/09/21   Elayne Snare, MD  metoprolol tartrate (LOPRESSOR) 25 MG tablet Take 0.5 tablets (12.5 mg total) by mouth 2 (two) times daily. Please check your blood pressure twice a day, please hold this medication if your systolic blood pressure ( top number) is less than 100, please call your doctor if your systolic blood pressure is less than 100. 10/05/21   Florencia Reasons, MD  ondansetron (ZOFRAN) 4 MG tablet Take 1 tablet (4 mg total) by mouth every 8 (eight) hours as needed for nausea or vomiting. 10/05/21   Florencia Reasons, MD  pregabalin (LYRICA) 150 MG capsule TAKE 1 CAPSULE(150 MG) BY MOUTH TWICE DAILY 11/02/21   Elayne Snare, MD  rosuvastatin (CRESTOR) 10 MG tablet Take 1 tablet (10 mg total) by mouth daily. 10/05/21   Florencia Reasons, MD      Allergies    Anesthetics, amide; Chlorhexidine; Penicillins; Buprenorphine hcl; Encainide; and Metoclopramide  Review of Systems   Review of Systems  Gastrointestinal:  Positive for abdominal pain and nausea.  Endocrine: Positive for polydipsia.       Elevated blood sugar  All other systems reviewed and are negative.   Physical Exam Updated Vital Signs BP (!) 122/40   Pulse (!) 116   Temp 97.7 F (36.5 C) (Oral)   Resp (!) 31   SpO2 100%  Physical Exam Vitals and nursing note reviewed.  Constitutional:      Appearance: She is ill-appearing.  HENT:     Head: Normocephalic and atraumatic.     Right Ear: External ear normal.     Left Ear: External ear normal.     Nose: Nose normal.     Mouth/Throat:     Mouth: Mucous membranes are dry.  Eyes:      Extraocular Movements: Extraocular movements intact.     Conjunctiva/sclera: Conjunctivae normal.     Pupils: Pupils are equal, round, and reactive to light.  Cardiovascular:     Rate and Rhythm: Regular rhythm. Tachycardia present.     Pulses: Normal pulses.     Heart sounds: Normal heart sounds.  Pulmonary:     Effort: Tachypnea present.     Comments: +Kussmaul breathing Abdominal:     General: Abdomen is flat. Bowel sounds are normal.     Palpations: Abdomen is soft.  Musculoskeletal:        General: Normal range of motion.     Cervical back: Normal range of motion and neck supple.  Skin:    General: Skin is warm.     Capillary Refill: Capillary refill takes less than 2 seconds.  Neurological:     General: No focal deficit present.     Mental Status: She is alert and oriented to person, place, and time.  Psychiatric:        Mood and Affect: Mood normal.        Behavior: Behavior normal.     ED Results / Procedures / Treatments   Labs (all labs ordered are listed, but only abnormal results are displayed) Labs Reviewed  BASIC METABOLIC PANEL - Abnormal; Notable for the following components:      Result Value   Potassium 5.5 (*)    CO2 10 (*)    Glucose, Bld 733 (*)    BUN 36 (*)    Creatinine, Ser 2.34 (*)    GFR, Estimated 24 (*)    Anion gap 29 (*)    All other components within normal limits  BETA-HYDROXYBUTYRIC ACID - Abnormal; Notable for the following components:   Beta-Hydroxybutyric Acid >8.00 (*)    All other components within normal limits  CBC WITH DIFFERENTIAL/PLATELET - Abnormal; Notable for the following components:   WBC 17.3 (*)    Hemoglobin 11.7 (*)    MCH 25.7 (*)    MCHC 29.8 (*)    Neutro Abs 15.7 (*)    Abs Immature Granulocytes 0.08 (*)    All other components within normal limits  CBG MONITORING, ED - Abnormal; Notable for the following components:   Glucose-Capillary >600 (*)    All other components within normal limits  I-STAT VENOUS  BLOOD GAS, ED - Abnormal; Notable for the following components:   pH, Ven 7.140 (*)    pCO2, Ven 22.3 (*)    pO2, Ven 90 (*)    Bicarbonate 7.6 (*)    TCO2 8 (*)    Acid-base deficit 20.0 (*)    HCT  35.0 (*)    Hemoglobin 11.9 (*)    All other components within normal limits  CBG MONITORING, ED - Abnormal; Notable for the following components:   Glucose-Capillary >600 (*)    All other components within normal limits  CBG MONITORING, ED - Abnormal; Notable for the following components:   Glucose-Capillary >600 (*)    All other components within normal limits  CBG MONITORING, ED - Abnormal; Notable for the following components:   Glucose-Capillary >600 (*)    All other components within normal limits  BASIC METABOLIC PANEL  BASIC METABOLIC PANEL  BASIC METABOLIC PANEL  BETA-HYDROXYBUTYRIC ACID  URINALYSIS, ROUTINE W REFLEX MICROSCOPIC  TROPONIN I (HIGH SENSITIVITY)  TROPONIN I (HIGH SENSITIVITY)    EKG EKG Interpretation  Date/Time:  Saturday January 06 2022 17:58:54 EDT Ventricular Rate:  113 PR Interval:  139 QRS Duration: 99 QT Interval:  347 QTC Calculation: 476 R Axis:   -52 Text Interpretation: Sinus tachycardia LAD, consider left anterior fascicular block Borderline ST depression, diffuse leads No significant change since last tracing Confirmed by Isla Pence [phone]) on 01/06/2022 6:09:56 PM  Radiology DG Chest Portable 1 View  Result Date: 01/06/2022 CLINICAL DATA:  Altered level of consciousness, hyperglycemia EXAM: PORTABLE CHEST 1 VIEW COMPARISON:  10/03/2021 FINDINGS: Single frontal view of the chest demonstrates stable postsurgical changes from CABG. Cardiac silhouette is unremarkable. No acute airspace disease, effusion, or pneumothorax. IMPRESSION: 1. No acute intrathoracic process. Electronically Signed   By: Randa Ngo M.D.   On: 01/06/2022 19:24    Procedures Procedures    Medications Ordered in ED Medications  insulin regular, human (MYXREDLIN)  100 units/ 100 mL infusion (8.5 Units/hr Intravenous New Bag/Given 01/06/22 1912)  lactated ringers infusion ( Intravenous New Bag/Given 01/06/22 1908)  dextrose 5 % in lactated ringers infusion (has no administration in time range)  dextrose 50 % solution 0-50 mL (has no administration in time range)  lactated ringers bolus 1,632 mL (1,632 mLs Intravenous New Bag/Given 01/06/22 1849)  ondansetron (ZOFRAN) injection 4 mg (4 mg Intravenous Given 01/06/22 2000)  morphine (PF) 4 MG/ML injection 4 mg (4 mg Intravenous Given 01/06/22 2000)    ED Course/ Medical Decision Making/ A&P                           Medical Decision Making Amount and/or Complexity of Data Reviewed Labs: ordered. Radiology: ordered.  Risk Prescription drug management.   This patient presents to the ED for concern of dka, this involves an extensive number of treatment options, and is a complaint that carries with it a high risk of complications and morbidity.  The differential diagnosis includes dka, hhs, infection   Co morbidities that complicate the patient evaluation  DM1, htn, hld, GERD, CAD s/p CABG, depression, and anxiety   Additional history obtained:  Additional history obtained from epic chart review External records from outside source obtained and reviewed including EMS report   Lab Tests:  I Ordered, and personally interpreted labs.  The pertinent results include:  glucose >600 on CBG; cbc with wbc 17.3 and hgb 11.7 (hgb 10.9 3 mo ago), bmp with k 5.5, glucose 733, CO2 10 cr elevated at 2.34 (last cr 1.41 in April); bhb elevated a>8; vbg with ph 7.14   Imaging Studies ordered:  I ordered imaging studies including cxr  I independently visualized and interpreted imaging which showed  IMPRESSION:  1. No acute intrathoracic process.     I  agree with the radiologist interpretation   Cardiac Monitoring:  The patient was maintained on a cardiac monitor.  I personally viewed and interpreted the  cardiac monitored which showed an underlying rhythm of: sinus tachy   Medicines ordered and prescription drug management:  I ordered medication including ivfs and iv insulin  for dka  Reevaluation of the patient after these medicines showed that the patient improved I have reviewed the patients home medicines and have made adjustments as needed  Critical Interventions:  Iv insulin and ivfs   Consultations Obtained:  I requested consultation with the IMTS,  and discussed lab and imaging findings as well as pertinent plan - they will admit.   Problem List / ED Course:  DKA:  IV insulin and IVFs ordered.  This is likely due to noncompliance.  She just saw the endocrinologist on 7/11.  She was told to go back on the pump.  She has not gone back on the pump.  She has been intermittently giving herself insulin. AKI:  Due to dehydration.  IVFs ordered   Reevaluation:  After the interventions noted above, I reevaluated the patient and found that they have :improved   Social Determinants of Health:  Lives at home   Dispostion:  After consideration of the diagnostic results and the patients response to treatment, I feel that the patent would benefit from admission.    CRITICAL CARE Performed by: Isla Pence   Total critical care time: 30 minutes  Critical care time was exclusive of separately billable procedures and treating other patients.  Critical care was necessary to treat or prevent imminent or life-threatening deterioration.  Critical care was time spent personally by me on the following activities: development of treatment plan with patient and/or surrogate as well as nursing, discussions with consultants, evaluation of patient's response to treatment, examination of patient, obtaining history from patient or surrogate, ordering and performing treatments and interventions, ordering and review of laboratory studies, ordering and review of radiographic studies, pulse  oximetry and re-evaluation of patient's condition.        Final Clinical Impression(s) / ED Diagnoses Final diagnoses:  Diabetic ketoacidosis without coma associated with type 1 diabetes mellitus ([place])  AKI (acute kidney injury) [facility])    Rx / DC Orders ED Discharge Orders     None         Isla Pence, MD 01/06/22 2058

## 2022-01-06 NOTE — ED Notes (Signed)
IV team at bedside 

## 2022-01-06 NOTE — H&P (Signed)
Date: 01/07/2022               Patient Name:  [name] MRN: [id number]  DOB: March 21, 1966 Age / Sex: 56 y.o., female   PCP: [name], MD         Medical Service: Internal Medicine Teaching Service         Attending Physician: Dr. Aldine Contes, MD    First Contact: Vena Rua, DO Pager: Lavone Nian [phone]  Second Contact: Christiana Fuchs, DO Pager: [place] Dec [phone]       After Hours (After 5p/  First Contact Pager: [phone]  weekends / holidays): Second Contact Pager: [phone]   SUBJECTIVE  Chief Complaint: elevated blood sugars  History of Present Illness: [name] is a 56 y.o. female with a pertinent PMH of type 1 diabetes mellitus with several complications including neuropathy, nephropathy, hypertension, hyperlipidemia, HFpEF, and CAD s/p CABG in 12/2017 who presents to [facility] with 2 days of abdominal pain, nausea, and vomiting.  She states her abdominal pain is primarily epigastric and along with her nausea and vomiting is consistent with her prior issues with gastroparesis.  She has been able to have 2 bowel movements today that were slightly softer than normal but denied any dark tarry color or bright red blood in her stool.  She denies any recent sick contacts, fevers, cough, chest pain, dysuria, hematuria, foul-smelling urine, or new or worsening sores on her feet.  Due to her nausea and vomiting she has not been able to take her morning medications today.  On 01/02/2022 she was seen by endocrinology and had changes made to her insulin regimen after having higher than target blood sugars.  She has been unable to fill her OmniPod prescription because the pharmacy did not have the OmniPod and the price was over $200 when it would come in.  She states Thursday morning she took her full dose of long-acting insulin along with mealtime insulin and then mealtime insulin in the evening.  Her last meal was on Thursday evening.  Friday morning she took only part of her long-acting and  mealtime insulin and then only part of her mealtime insulin on Friday evening and this morning.  She states her blood sugars usually run around 150 and were up above 300 yesterday.  She could not tell us exactly how much insulin she took.  In the ED, the patient was found to be in DKA with initial blood sugar of 733, pH of 7.14 on venous blood gas, with an anion gap of 29, beta hydroxybutyrate over 8, and a potassium of 5.5.  She was started on insulin drip and given IV fluids.  Blood sugar had dropped to 572 upon our examination.  Medications: Lantus 15 units daily Novolog Usually 6-8 units at breakfast, 2-3 units at lunch and dinner  OmniPod (Settings of: 12 AM-9 AM = 0.75, 9 AM = 0.9, 12 noon = 1.2, 4 PM = 0.85) Lasix 40 mg daily Losartan 25 mg daily Metoprolol Tartrate 12.5 mg BID Pregabalin 150 mg BID Rosuvastatin 10 mg daily Metoclopramide 10 mg TID Aspirin 81 mg daily Ondansetron 4 mg q8h PRN    Past Medical History:  Past Medical History:  Diagnosis Date   AKI (acute kidney injury) ([place])    Anemia, iron deficiency    Anxiety and depression 05/18/2015   CAD (coronary artery disease), native coronary artery 01/11/2018   s/p CABG - Non-STEMI with severe three-vessel disease noted July 2019   Depression    Diabetic  gastroparesis ([place])    Diabetic neuropathy, type I diabetes mellitus ([place]) 05/18/2015   DKA, type 1 ([place]) 03/18/2016   Essential hypertension    Gastroparesis    GERD (gastroesophageal reflux disease)    HLD (hyperlipidemia)    MDD (major depressive disorder), single episode, severe , no psychosis ([place])    Tardive dyskinesia    Vitamin B12 deficiency 08/16/2015    Social:  Lives -at home by herself Support -denies consistent support.  Has her groceries and prescription medications delivered to her. Level of function -she is able to cook by herself but not able to shop for herself.  Uses a walker to ambulate after a fall a couple years ago. PCP - [name], MD Substance use -denies any current or prior tobacco, alcohol, or drug use.  Family History: Family History  Problem Relation Age of Onset   Cystic fibrosis Mother    Hypertension Father    Diabetes Brother    Hypertension Maternal Grandmother     Allergies: Allergies as of 01/06/2022 - Review Complete 01/06/2022  Allergen Reaction Noted   Anesthetics, amide Nausea And Vomiting 03/21/2012   Chlorhexidine  05/10/2019   Penicillins Diarrhea, Nausea And Vomiting, and Other (See Comments) 10/20/2014   Buprenorphine hcl Rash 02/08/2015   Encainide Nausea And Vomiting 02/08/2015   Metoclopramide Other (See Comments) 11/29/2014    Review of Systems: A complete ROS was negative except as per HPI.   OBJECTIVE:  Physical Exam: Blood pressure (!) 130/50, pulse 94, temperature 98.9 F (37.2 C), temperature source Oral, resp. rate 20, height '5\' 1"'$  (1.549 m), weight 80.2 kg, SpO2 98 %. Physical Exam Constitutional:      General: She is in acute distress.     Appearance: She is obese. She is ill-appearing.     Comments: Middle-aged female in moderate distress.  Appears uncomfortable and is obviously tachypneic without signs of airway compromise.  Eyes:     Extraocular Movements: Extraocular movements intact.     Conjunctiva/sclera: Conjunctivae normal.     Pupils: Pupils are equal, round, and reactive to light.  Cardiovascular:     Rate and Rhythm: Regular rhythm. Tachycardia present.     Pulses:          Dorsalis pedis pulses are 2+ on the right side and 2+ on the left side.  Pulmonary:     Effort: Tachypnea present. No accessory muscle usage or respiratory distress.     Breath sounds: No wheezing.  Abdominal:     General: Bowel sounds are decreased. There is no distension.     Palpations: Abdomen is soft.     Tenderness: There is generalized abdominal tenderness (Diffuse abdominal tenderness, worst in the epigastric area.). There is no guarding or rebound. Negative signs  include McBurney's sign.  Musculoskeletal:     Cervical back: Muscular tenderness (Tender and firm muscular knot over paracervical musculature) present.     Right lower leg: 1+ Edema present.     Left lower leg: 1+ Edema present.  Feet:     Right foot:     Skin integrity: Dry skin present. No ulcer.     Toenail Condition: Right toenails are abnormally thick.     Left foot:     Skin integrity: Dry skin present. No ulcer.     Toenail Condition: Left toenails are abnormally thick.     Comments: Overall intact sensation bilaterally.  Healing wound over left heel with a layer of dead superficial skin overlying.  No  obvious erythema, edema, tenderness, or drainage around this area. Neurological:     General: No focal deficit present.     Mental Status: She is oriented to person, place, and time.     Cranial Nerves: No dysarthria or facial asymmetry.       Pertinent Labs: CBC    Component Value Date/Time   WBC 17.3 (H) 01/06/2022 1845   RBC 4.56 01/06/2022 1845   HGB 11.9 (L) 01/06/2022 2028   HGB 11.4 11/05/2018 1419   HCT 35.0 (L) 01/06/2022 2028   HCT 33.5 (L) 11/05/2018 1419   PLT 358 01/06/2022 1845   PLT 292 11/05/2018 1419   MCV 86.2 01/06/2022 1845   MCV 89 11/05/2018 1419   MCH 25.7 (L) 01/06/2022 1845   MCHC 29.8 (L) 01/06/2022 1845   RDW 14.9 01/06/2022 1845   RDW 12.7 11/05/2018 1419   LYMPHSABS 0.8 01/06/2022 1845   MONOABS 0.7 01/06/2022 1845   EOSABS 0.0 01/06/2022 1845   BASOSABS 0.0 01/06/2022 1845     CMP     Component Value Date/Time   NA 140 01/07/2022 0116   NA 136 10/10/2018 1242   K 4.8 01/07/2022 0116   CL 108 01/07/2022 0116   CO2 15 (L) 01/07/2022 0116   GLUCOSE 333 (H) 01/07/2022 0116   BUN 39 (H) 01/07/2022 0116   BUN 12 10/10/2018 1242   CREATININE 2.05 (H) 01/07/2022 0116   CREATININE 1.11 (H) 09/04/2016 1459   CALCIUM 9.2 01/07/2022 0116   PROT 6.7 10/04/2021 0249   ALBUMIN 3.1 (L) 10/04/2021 0249   AST 18 10/04/2021 0249   ALT 16  10/04/2021 0249   ALKPHOS 85 10/04/2021 0249   BILITOT 0.9 10/04/2021 0249   GFRNONAA 28 (L) 01/07/2022 0116   GFRNONAA 58 (L) 09/04/2016 1459   GFRAA 54 (L) 05/15/2019 0344   GFRAA 67 09/04/2016 1459       Latest Ref Rng & Units 01/07/2022    1:16 AM 01/06/2022    8:28 PM 01/06/2022    6:45 PM  BMP  Glucose 70 - 99 mg/dL 333   733   BUN 6 - 20 mg/dL 39   36   Creatinine 0.44 - 1.00 mg/dL 2.05   2.34   Sodium 135 - 145 mmol/L 140  138  137   Potassium 3.5 - 5.1 mmol/L 4.8  4.8  5.5   Chloride 98 - 111 mmol/L 108   98   CO2 22 - 32 mmol/L 15   10   Calcium 8.9 - 10.3 mg/dL 9.2   9.6      CBG (last 3)  Recent Labs    01/07/22 0323 01/07/22 0423 01/07/22 0523  GLUCAP 260* 190* 195*    Lab Results  Component Value Date   CREATININE 2.05 (H) 01/07/2022   CREATININE 2.34 (H) 01/06/2022   CREATININE 1.41 (H) 10/05/2021    Lab Results  Component Value Date   HGBA1C 8.8 (A) 01/02/2022     Lipase     Component Value Date/Time   LIPASE 23 01/07/2022 0116     Pertinent Imaging: DG Chest Portable 1 View  Result Date: 01/06/2022 CLINICAL DATA:  Altered level of consciousness, hyperglycemia EXAM: PORTABLE CHEST 1 VIEW COMPARISON:  10/03/2021 FINDINGS: Single frontal view of the chest demonstrates stable postsurgical changes from CABG. Cardiac silhouette is unremarkable. No acute airspace disease, effusion, or pneumothorax. IMPRESSION: 1. No acute intrathoracic process. Electronically Signed   By: Randa Ngo M.D.   On: 01/06/2022  19:24    EKG: personally reviewed my interpretation is unchanged from previous tracings, sinus tachycardia  ASSESSMENT & PLAN:  Assessment: Principal Problem:   DKA (diabetic ketoacidosis) ([place])   [name] is a 56 y.o. with pertinent PMH of 1 diabetes who presented with 2 days of abdominal pain, nausea, and vomiting and admit for DKA on hospital day 1  Plan: #DKA #AGMA --Likely due to decreased insulin intake over the past 2 days.   Was last treated for DKA in 09/2021.  Low suspicion for pneumonia, UTI, myocardial/intracranial/intestinal ischemia or infarction, or any intoxication. --Appears to be primary anion gap metabolic acidosis with borderline delta delta indicating possible concomitant non-anion gap metabolic acidosis. Can consider further workup if she remains acidemic despite gap closure.(Urine AGAP) --Negative pregnancy test, MRSA PCR, chest x-ray. Lipase 23. UA pending collection --Initial blood glucose of 733 which trended down overnight to 195 --Anion gap initially 29 and dropped to 17 on repeat 6 hours later  --Initially hyperkalemic at 5.5 which normalized to 4.8 within 2 hours and remained stable overnight --Initial Beta-Hydroxybutyric Acid level above 8 with repeat level 2.24 --Serial BMPs to monitor anion gap, glucose, electrolytes --Continue DKA protocol with the insulin drip, potassium repletion, IV fluids (LR at 125 mL/hr).  Now that the blood glucose is below 250 LR replaced with D5 LR.  We will continue until anion gap acidosis has resolved.  #AKI on CKD Stage 3b --Creatinine on admission is 2.34 which is elevated from baseline of 1.4-1.5 --Likely prerenal secondary to dehydration due to low oral intake --Repeat BMP showed creatinine dropped to 2.05 after IV fluid rehydration through DKA protocol --Continue IV fluids per DKA protocol and we will monitor this with serial BMPs  #Type 1 diabetes mellitus (complicated by gastroparesis, neuropathy) -- She had been unable to fill her insulin prescription and has been taking less insulin than needed over the past few days which likely is the main cause of her DKA --She is on mealtime NovoLog and with OmniPod as well as Lantus 15 units in the morning.  Per recent endocrinology note she was taking 6 to 8 units at breakfast and 2 to 3 units at lunch and dinner --Home meds also include metoclopramide 10 mg 3 times a day, Zofran 4 mg as needed, and pregabalin 150 mg  twice a day --We will hold her home meds tonight and reevaluate in the morning to consider resuming metoclopramide and pregabalin  #Hypertension --Hypotensive on admission despite not taking home medications --Home medications include Lasix 40 mg daily, losartan 25 mg daily, and metoprolol tartrate 25 mg twice daily --Patient appears overall euvolemic with some mild lower extremity edema --Continue to hold home medications and monitor blood pressure with telemetry --Improved with IV fluid rehydration. Consider resuming home medications as tolerated   #HFpEF #CAD s/p CABG --Echocardiogram from 04/2019 shows LVEF 55 to 60% with normal left and right ventricular function --CABGx3 (LIMA to LAD, SVG to PDA, SVG to OM) in 12/2017 --Troponin negative x2 --Home medications include aspirin 81 mg daily, Lasix 40 mg daily, losartan 25 mg daily, metoprolol tartrate 25 mg twice daily, and rosuvastatin 10 mg daily --Continue rosuvastatin 10 mg daily and aspirin 81 mg daily --We will hold home other medications in the setting of low blood pressures  #QTc prolongation --Initial EKG showed QTc of 476 when K was 5.5 --We held home Zofran initially until pt had worsening nausea and vomiting  --Repeat EKG showed QTc of 455 --Zofran 4 mg q8h  PRN   #Iron deficiency anemia --History of iron deficiency anemia --Hemoglobin 11.9 --No signs of bleeding at this time  Best Practice: Diet: N.p.o. IVF: Fluids: LR, Rate:  125 mL/hr VTE: rivaroxaban (XARELTO) tablet 10 mg Start: 01/06/22 2145 Code: Full AB: None Status: Inpatient with expected length of stay greater than 2 midnights. Anticipated Discharge Location: Home Barriers to Discharge: Decreased caregiver support, Lack of/limited family support, and Medication compliance  Signature: Haze Justin, D.O. Internal Medicine Resident, PGY-1 [facility] Pager: [phone] 6:28 AM, 01/07/2022   Please contact the on call  pager after 5 pm and on weekends at [phone].

## 2022-01-06 NOTE — ED Notes (Signed)
ED TO INPATIENT HANDOFF REPORT    S Name/Age/Gender [name] 56 y.o. female Room/Bed: 015C/015C  Code Status   Code Status: Full Code  Alert and oriented x4  Triage Complete: Triage complete  Chief Complaint DKA (diabetic ketoacidosis) ([place]) [E11.10]  Triage Note No notes on file   Allergies Allergies  Allergen Reactions   Anesthetics, Amide Nausea And Vomiting   Chlorhexidine    Penicillins Diarrhea, Nausea And Vomiting and Other (See Comments)    Has patient had a PCN reaction causing immediate rash, facial/tongue/throat swelling, SOB or lightheadedness with hypotension: Yes Has patient had a PCN reaction causing severe rash involving mucus membranes or skin necrosis: No Has patient had a PCN reaction that required hospitalization No Has patient had a PCN reaction occurring within the last 10 years: Yes  If all of the above answers are "NO", then may proceed with Cephalosporin use.    Buprenorphine Hcl Rash   Encainide Nausea And Vomiting   Metoclopramide Other (See Comments)    Currently no side effects, previously reportedly had dystonia, muscle rigidity, slurred speech, tongue swelling    Level of Care/Admitting Diagnosis ED Disposition     ED Disposition  Admit   Condition  --   [place]: [facility] [[id number]]  Level of Care: Progressive [102]  Admit to Progressive based on following criteria: GI, ENDOCRINE disease patients with GI bleeding, acute liver failure or pancreatitis, stable with diabetic ketoacidosis or thyrotoxicosis (hypothyroid) state.  May admit patient to [facility] or [facility] if equivalent level of care is available:: No  Covid Evaluation: Asymptomatic - no recent exposure (last 10 days) testing not required  Diagnosis: DKA (diabetic ketoacidosis) [facility]) [[id number]]  Admitting Physician: Aldine Contes [[id number]]  Attending Physician: Aldine Contes [[id number]]  Certification:: I certify this patient  will need inpatient services for at least 2 midnights  Estimated Length of Stay: 2          B Medical/Surgery History Past Medical History:  Diagnosis Date   AKI (acute kidney injury) ([place])    Anemia, iron deficiency    Anxiety and depression 05/18/2015   CAD (coronary artery disease), native coronary artery 01/11/2018   s/p CABG - Non-STEMI with severe three-vessel disease noted July 2019   Depression    Diabetic gastroparesis ([place])    Diabetic neuropathy, type I diabetes mellitus ([place]) 05/18/2015   DKA, type 1 ([place]) 03/18/2016   Essential hypertension    Gastroparesis    GERD (gastroesophageal reflux disease)    HLD (hyperlipidemia)    MDD (major depressive disorder), single episode, severe , no psychosis ([place])    Tardive dyskinesia    Vitamin B12 deficiency 08/16/2015   Past Surgical History:  Procedure Laterality Date   CARDIAC SURGERY     COLONOSCOPY  09/27/2014   at [facility] N/A 01/14/2018   Procedure: CORONARY ARTERY BYPASS GRAFTING (CABG)X3, RIGHT AND LEFT SAPHENOUS VEIN HARVEST, MAMMARY TAKE DOWN. MAMMARY TO LAD, SVG TO PD, SVG TO DISTAL CIRC.;  Surgeon: Grace Isaac, MD;  Location: [facility];  Service: Open Heart Surgery;  Laterality: N/A;   ESOPHAGOGASTRODUODENOSCOPY  09/27/2014   at [facility], Dr Rolan Lipa. biospy neg for celiac, neg for H pylori.    EYE SURGERY     gailstones     INTRAMEDULLARY (IM) NAIL INTERTROCHANTERIC Right 05/10/2019   Procedure: INTRAMEDULLARY (IM) NAIL INTERTROCHANTRIC;  Surgeon: Lovell Sheehan, MD;  Location: [facility];  Service: Orthopedics;  Laterality: Right;   IR FLUORO GUIDE CV LINE RIGHT  02/01/2017   IR FLUORO GUIDE CV LINE RIGHT  03/06/2017   IR FLUORO GUIDE CV LINE RIGHT  03/25/2017   IR GENERIC HISTORICAL  01/24/2016   IR FLUORO GUIDE CV LINE RIGHT 01/24/2016 Darrell K Allred, PA-C [facility] RAD   IR GENERIC HISTORICAL  01/24/2016   IR US GUIDE VASC ACCESS RIGHT 01/24/2016 Darrell K Allred, PA-C [facility]  RAD   IR US GUIDE VASC ACCESS RIGHT  02/01/2017   IR US GUIDE VASC ACCESS RIGHT  03/06/2017   IR US GUIDE VASC ACCESS RIGHT  03/25/2017   IR US GUIDE VASC ACCESS RIGHT  01/20/2019   IR VENIPUNCTURE 81YRS/OLDER BY MD  01/20/2019   LEFT HEART CATH AND CORONARY ANGIOGRAPHY N/A 01/07/2018   Procedure: LEFT HEART CATH AND CORONARY ANGIOGRAPHY;  Surgeon: Troy Sine, MD;  Location: [facility];  Service: Cardiovascular;  Laterality: N/A;   POSTERIOR VITRECTOMY AND MEMBRANE PEEL-LEFT EYE  09/28/2002   POSTERIOR VITRECTOMY AND MEMBRANE PEEL-RIGHT EYE  03/16/2002   RETINAL DETACHMENT SURGERY     TEE WITHOUT CARDIOVERSION N/A 01/14/2018   Procedure: TRANSESOPHAGEAL ECHOCARDIOGRAM (TEE);  Surgeon: Grace Isaac, MD;  Location: [facility];  Service: Open Heart Surgery;  Laterality: N/A;     A IV Location/Drains/Wounds Patient Lines/Drains/Airways Status     Active Line/Drains/Airways     Name Placement date Placement time Site Days   Peripheral IV --  --  --  --   Peripheral IV 01/06/22 22 G 1.75" Anterior;Left Forearm 01/06/22  1846  Forearm  less than 1   Peripheral IV 01/06/22 20 G Right Other (Comment) 01/06/22  2032  Other (Comment)  less than 1   External Urinary Catheter 05/11/19  1320  --  971   External Urinary Catheter 10/04/21  --  --  94   Incision - 3 Ports Other (Comment) Right Mid Left 05/10/19  1347  -- 972   Pressure Injury 10/04/21 Heel Left Stage 3 -  Full thickness tissue loss. Subcutaneous fat may be visible but bone, tendon or muscle are NOT exposed. pink, red, white 10/04/21  1710  -- 94   Pressure Injury 10/04/21 Heel Right Unstageable - Full thickness tissue loss in which the base of the injury is covered by slough (yellow, tan, gray, green or brown) and/or eschar (tan, brown or black) in the wound bed. brown/black 10/04/21  1710  -- 94            Intake/Output Last 24 hours No intake or output data in the 24 hours ending 01/06/22 2211  Labs/Imaging Results for  orders placed or performed during the hospital encounter of 01/06/22 (from the past 48 hour(s))  CBG monitoring, ED     Status: Abnormal   Collection Time: 01/06/22  5:54 PM  Result Value Ref Range   Glucose-Capillary >600 (HH) 70 - 99 mg/dL    Comment: Glucose reference range applies only to samples taken after fasting for at least 8 hours.   Comment 1 Notify RN    Comment 2 Document in Chart   Basic metabolic panel     Status: Abnormal   Collection Time: 01/06/22  6:45 PM  Result Value Ref Range   Sodium 137 135 - 145 mmol/L   Potassium 5.5 (H) 3.5 - 5.1 mmol/L   Chloride 98 98 - 111 mmol/L   CO2 10 (L) 22 - 32 mmol/L   Glucose, Bld 733 (HH) 70 -  99 mg/dL    Comment: CRITICAL RESULT CALLED TO, READ BACK BY AND VERIFIED WITH OLDLAND B,RN 01/06/22 2010 WAYK Glucose reference range applies only to samples taken after fasting for at least 8 hours.    BUN 36 (H) 6 - 20 mg/dL   Creatinine, Ser 2.34 (H) 0.44 - 1.00 mg/dL   Calcium 9.6 8.9 - 10.3 mg/dL   GFR, Estimated 24 (L) >60 mL/min    Comment: (NOTE) Calculated using the CKD-EPI Creatinine Equation (2021)    Anion gap 29 (H) 5 - 15    Comment: REPEATED TO VERIFY Performed at [facility] [address], [place], [place] [address]   Beta-hydroxybutyric acid     Status: Abnormal   Collection Time: 01/06/22  6:45 PM  Result Value Ref Range   Beta-Hydroxybutyric Acid >8.00 (H) 0.05 - 0.27 mmol/L    Comment: RESULT CONFIRMED BY MANUAL DILUTION Performed at [facility] [address] [place], [place] [address]   CBC with Differential (PNL)     Status: Abnormal   Collection Time: 01/06/22  6:45 PM  Result Value Ref Range   WBC 17.3 (H) 4.0 - 10.5 K/uL   RBC 4.56 3.87 - 5.11 MIL/uL   Hemoglobin 11.7 (L) 12.0 - 15.0 g/dL   HCT 39.3 36.0 - 46.0 %   MCV 86.2 80.0 - 100.0 fL   MCH 25.7 (L) 26.0 - 34.0 pg   MCHC 29.8 (L) 30.0 - 36.0 g/dL   RDW 14.9 11.5 - 15.5 %   Platelets 358 150 - 400 K/uL   nRBC 0.0 0.0 - 0.2 %    Neutrophils Relative % 90 %   Neutro Abs 15.7 (H) 1.7 - 7.7 K/uL   Lymphocytes Relative 5 %   Lymphs Abs 0.8 0.7 - 4.0 K/uL   Monocytes Relative 4 %   Monocytes Absolute 0.7 0.1 - 1.0 K/uL   Eosinophils Relative 0 %   Eosinophils Absolute 0.0 0.0 - 0.5 K/uL   Basophils Relative 0 %   Basophils Absolute 0.0 0.0 - 0.1 K/uL   Immature Granulocytes 1 %   Abs Immature Granulocytes 0.08 (H) 0.00 - 0.07 K/uL    Comment: Performed at [facility] [address], [place], [place] [address]  Troponin I (High Sensitivity)     Status: None   Collection Time: 01/06/22  6:45 PM  Result Value Ref Range   Troponin I (High Sensitivity) 11 <18 ng/L    Comment: (NOTE) Elevated high sensitivity troponin I (hsTnI) values and significant  changes across serial measurements may suggest ACS but many other  chronic and acute conditions are known to elevate hsTnI results.  Refer to the "Links" section for chest pain algorithms and additional  guidance. Performed at [facility], [place] [address] [place],  [address]   CBG monitoring, ED     Status: Abnormal   Collection Time: 01/06/22  7:00 PM  Result Value Ref Range   Glucose-Capillary >600 (HH) 70 - 99 mg/dL    Comment: Glucose reference range applies only to samples taken after fasting for at least 8 hours.   Comment 1 Notify RN    Comment 2 Document in Chart   CBG monitoring, ED     Status: Abnormal   Collection Time: 01/06/22  7:45 PM  Result Value Ref Range   Glucose-Capillary >600 (HH) 70 - 99 mg/dL    Comment: Glucose reference range applies only to samples taken after fasting for  at least 8 hours.   Comment 1 Notify RN    Comment 2 Document in Chart   CBG monitoring, ED     Status: Abnormal   Collection Time: 01/06/22  8:13 PM  Result Value Ref Range   Glucose-Capillary >600 (HH) 70 - 99 mg/dL    Comment: Glucose reference range applies only to samples taken after fasting for at least 8 hours.   Comment 1 Notify RN     Comment 2 Document in Chart   Troponin I (High Sensitivity)     Status: None   Collection Time: 01/06/22  8:20 PM  Result Value Ref Range   Troponin I (High Sensitivity) 13 <18 ng/L    Comment: (NOTE) Elevated high sensitivity troponin I (hsTnI) values and significant  changes across serial measurements may suggest ACS but many other  chronic and acute conditions are known to elevate hsTnI results.  Refer to the "Links" section for chest pain algorithms and additional  guidance. Performed at [facility], [place] [address], [place], [place] [address]   I-Stat Venous Blood Gas, ED     Status: Abnormal   Collection Time: 01/06/22  8:28 PM  Result Value Ref Range   pH, Ven 7.140 (LL) 7.25 - 7.43   pCO2, Ven 22.3 (L) 44 - 60 mmHg   pO2, Ven 90 (H) 32 - 45 mmHg   Bicarbonate 7.6 (L) 20.0 - 28.0 mmol/L   TCO2 8 (L) 22 - 32 mmol/L   O2 Saturation 94 %   Acid-base deficit 20.0 (H) 0.0 - 2.0 mmol/L   Sodium 138 135 - 145 mmol/L   Potassium 4.8 3.5 - 5.1 mmol/L   Calcium, Ion 1.22 1.15 - 1.40 mmol/L   HCT 35.0 (L) 36.0 - 46.0 %   Hemoglobin 11.9 (L) 12.0 - 15.0 g/dL   Sample type VENOUS    Comment NOTIFIED PHYSICIAN   CBG monitoring, ED     Status: Abnormal   Collection Time: 01/06/22  9:26 PM  Result Value Ref Range   Glucose-Capillary 572 (HH) 70 - 99 mg/dL    Comment: Glucose reference range applies only to samples taken after fasting for at least 8 hours.   Comment 1 Notify RN    Comment 2 Document in Chart   I-Stat beta hCG blood, ED ([facility], [facility], [facility] only)     Status: None   Collection Time: 01/06/22  9:57 PM  Result Value Ref Range   I-stat hCG, quantitative <5.0 <5 mIU/mL   Comment 3            Comment:   GEST. AGE      CONC.  (mIU/mL)   <=1 WEEK        5 - 50     2 WEEKS       50 - 500     3 WEEKS       100 - 10,000     4 WEEKS     1,000 - 30,000        FEMALE AND NON-PREGNANT FEMALE:     LESS THAN 5 mIU/mL    *Note: Due to a large number of results and/or encounters  for the requested time period, some results have not been displayed. A complete set of results can be found in Results Review.   DG Chest Portable 1 View  Result Date: 01/06/2022 CLINICAL DATA:  Altered level of consciousness, hyperglycemia EXAM: PORTABLE CHEST 1 VIEW COMPARISON:  10/03/2021 FINDINGS: Single frontal view  of the chest demonstrates stable postsurgical changes from CABG. Cardiac silhouette is unremarkable. No acute airspace disease, effusion, or pneumothorax. IMPRESSION: 1. No acute intrathoracic process. Electronically Signed   By: Randa Ngo M.D.   On: 01/06/2022 19:24    Pending Labs Unresulted Labs (From admission, onward)     Start     Ordered   01/06/22 2137  hCG, quantitative, pregnancy  Once,   R        01/06/22 2139   01/06/22 5456  Basic metabolic panel  (Diabetes Ketoacidosis (DKA))  STAT Now then every 4 hours ,   STAT      01/06/22 1802   01/06/22 1802  Beta-hydroxybutyric acid  (Diabetes Ketoacidosis (DKA))  Now then every 8 hours,   STAT      01/06/22 1802   01/06/22 1802  Urinalysis, Routine w reflex microscopic  (Diabetes Ketoacidosis (DKA))  ONCE - STAT,   URGENT        01/06/22 1802            Vitals/Pain Today's Vitals   01/06/22 1954 01/06/22 2000 01/06/22 2030 01/06/22 2054  BP:  (!) 118/42 (!) 122/40   Pulse:  (!) 115 (!) 116   Resp:  (!) 28 (!) 31   Temp:      TempSrc:      SpO2:  100% 100%   PainSc: 0-No pain   Asleep    Isolation Precautions No active isolations  Medications Medications  lactated ringers infusion ( Intravenous New Bag/Given 01/06/22 1908)  rivaroxaban (XARELTO) tablet 10 mg (10 mg Oral Given 01/06/22 2210)  lactated ringers bolus 1,632 mL (1,632 mLs Intravenous Not Given 01/06/22 2152)  dextrose 5 % in lactated ringers infusion ( Intravenous Not Given 01/06/22 2149)  dextrose 50 % solution 0-50 mL (has no administration in time range)  potassium chloride 10 mEq in 100 mL IVPB (10 mEq Intravenous New Bag/Given  01/06/22 2204)  insulin regular, human (MYXREDLIN) 100 units/ 100 mL infusion (9 Units/hr Intravenous New Bag/Given 01/06/22 2146)  lactated ringers bolus 1,632 mL (0 mLs Intravenous Stopped 01/06/22 2118)  ondansetron (ZOFRAN) injection 4 mg (4 mg Intravenous Given 01/06/22 2000)  morphine (PF) 4 MG/ML injection 4 mg (4 mg Intravenous Given 01/06/22 2000)  lactated ringers bolus 1,000 mL (1,000 mLs Intravenous New Bag/Given 01/06/22 2141)    Mobility: [facility]         R Recommendations: See Admitting Provider Note

## 2022-01-07 DIAGNOSIS — I5030 Unspecified diastolic (congestive) heart failure: Secondary | ICD-10-CM

## 2022-01-07 DIAGNOSIS — I2510 Atherosclerotic heart disease of native coronary artery without angina pectoris: Secondary | ICD-10-CM

## 2022-01-07 DIAGNOSIS — N1832 Chronic kidney disease, stage 3b: Secondary | ICD-10-CM

## 2022-01-07 DIAGNOSIS — E1010 Type 1 diabetes mellitus with ketoacidosis without coma: Principal | ICD-10-CM

## 2022-01-07 DIAGNOSIS — Z794 Long term (current) use of insulin: Secondary | ICD-10-CM

## 2022-01-07 DIAGNOSIS — K3184 Gastroparesis: Secondary | ICD-10-CM

## 2022-01-07 DIAGNOSIS — I1310 Hypertensive heart and chronic kidney disease without heart failure, with stage 1 through stage 4 chronic kidney disease, or unspecified chronic kidney disease: Secondary | ICD-10-CM

## 2022-01-07 DIAGNOSIS — E0810 Diabetes mellitus due to underlying condition with ketoacidosis without coma: Secondary | ICD-10-CM

## 2022-01-07 DIAGNOSIS — D509 Iron deficiency anemia, unspecified: Secondary | ICD-10-CM

## 2022-01-07 DIAGNOSIS — N179 Acute kidney failure, unspecified: Secondary | ICD-10-CM

## 2022-01-07 DIAGNOSIS — E1040 Type 1 diabetes mellitus with diabetic neuropathy, unspecified: Secondary | ICD-10-CM

## 2022-01-07 LAB — BASIC METABOLIC PANEL
Anion gap: 17 — ABNORMAL HIGH (ref 5–15)
Anion gap: 10 (ref 5–15)
BUN: 39 mg/dL — ABNORMAL HIGH (ref 6–20)
BUN: 34 mg/dL — ABNORMAL HIGH (ref 6–20)
CO2: 15 mmol/L — ABNORMAL LOW (ref 22–32)
CO2: 18 mmol/L — ABNORMAL LOW (ref 22–32)
Calcium: 9.2 mg/dL (ref 8.9–10.3)
Calcium: 8.5 mg/dL — ABNORMAL LOW (ref 8.9–10.3)
Chloride: 108 mmol/L (ref 98–111)
Chloride: 115 mmol/L — ABNORMAL HIGH (ref 98–111)
Creatinine, Ser: 2.05 mg/dL — ABNORMAL HIGH (ref 0.44–1.00)
Creatinine, Ser: 1.5 mg/dL — ABNORMAL HIGH (ref 0.44–1.00)
GFR, Estimated: 28 mL/min — ABNORMAL LOW (ref >60)
GFR, Estimated: 41 mL/min — ABNORMAL LOW (ref >60)
Glucose, Bld: 333 mg/dL — ABNORMAL HIGH (ref 70–99)
Glucose, Bld: 112 mg/dL — ABNORMAL HIGH (ref 70–99)
Potassium: 4.8 mmol/L (ref 3.5–5.1)
Potassium: 3.9 mmol/L (ref 3.5–5.1)
Sodium: 140 mmol/L (ref 135–145)
Sodium: 143 mmol/L (ref 135–145)

## 2022-01-07 LAB — GLUCOSE, CAPILLARY
Glucose-Capillary: 111 mg/dL — ABNORMAL HIGH (ref 70–99)
Glucose-Capillary: 118 mg/dL — ABNORMAL HIGH (ref 70–99)
Glucose-Capillary: 118 mg/dL — ABNORMAL HIGH (ref 70–99)
Glucose-Capillary: 119 mg/dL — ABNORMAL HIGH (ref 70–99)
Glucose-Capillary: 136 mg/dL — ABNORMAL HIGH (ref 70–99)
Glucose-Capillary: 140 mg/dL — ABNORMAL HIGH (ref 70–99)
Glucose-Capillary: 140 mg/dL — ABNORMAL HIGH (ref 70–99)
Glucose-Capillary: 160 mg/dL — ABNORMAL HIGH (ref 70–99)
Glucose-Capillary: 161 mg/dL — ABNORMAL HIGH (ref 70–99)
Glucose-Capillary: 164 mg/dL — ABNORMAL HIGH (ref 70–99)
Glucose-Capillary: 167 mg/dL — ABNORMAL HIGH (ref 70–99)
Glucose-Capillary: 169 mg/dL — ABNORMAL HIGH (ref 70–99)
Glucose-Capillary: 174 mg/dL — ABNORMAL HIGH (ref 70–99)
Glucose-Capillary: 175 mg/dL — ABNORMAL HIGH (ref 70–99)
Glucose-Capillary: 190 mg/dL — ABNORMAL HIGH (ref 70–99)
Glucose-Capillary: 195 mg/dL — ABNORMAL HIGH (ref 70–99)
Glucose-Capillary: 200 mg/dL — ABNORMAL HIGH (ref 70–99)
Glucose-Capillary: 260 mg/dL — ABNORMAL HIGH (ref 70–99)
Glucose-Capillary: 294 mg/dL — ABNORMAL HIGH (ref 70–99)
Glucose-Capillary: 324 mg/dL — ABNORMAL HIGH (ref 70–99)
Glucose-Capillary: 371 mg/dL — ABNORMAL HIGH (ref 70–99)
Glucose-Capillary: 92 mg/dL (ref 70–99)
Glucose-Capillary: 97 mg/dL (ref 70–99)

## 2022-01-07 LAB — HEPATIC FUNCTION PANEL
ALT: 15 U/L (ref 0–44)
AST: 22 U/L (ref 15–41)
Albumin: 2.9 g/dL — ABNORMAL LOW (ref 3.5–5.0)
Alkaline Phosphatase: 91 U/L (ref 38–126)
Bilirubin, Direct: 0.1 mg/dL (ref 0.0–0.2)
Indirect Bilirubin: 0.4 mg/dL (ref 0.3–0.9)
Total Bilirubin: 0.5 mg/dL (ref 0.3–1.2)
Total Protein: 6.2 g/dL — ABNORMAL LOW (ref 6.5–8.1)

## 2022-01-07 LAB — HCG, QUANTITATIVE, PREGNANCY: hCG, Beta Chain, Quant, S: 3 m[IU]/mL (ref <5)

## 2022-01-07 LAB — BETA-HYDROXYBUTYRIC ACID
Beta-Hydroxybutyric Acid: 2.24 mmol/L — ABNORMAL HIGH (ref 0.05–0.27)
Beta-Hydroxybutyric Acid: 0.15 mmol/L (ref 0.05–0.27)

## 2022-01-07 LAB — LIPASE, BLOOD: Lipase: 23 U/L (ref 11–51)

## 2022-01-07 LAB — MRSA NEXT GEN BY PCR, NASAL: MRSA by PCR Next Gen: NOT DETECTED

## 2022-01-07 MED ORDER — ROSUVASTATIN CALCIUM 5 MG PO TABS
10.0000 mg | ORAL_TABLET | Freq: Every day | ORAL | Status: DC
  Administered 2022-01-07 – 2022-01-10 (×4): 10 mg via ORAL
  Filled 2022-01-07 (×4): qty 2

## 2022-01-07 MED ORDER — ACETAMINOPHEN 500 MG PO TABS
1000.0000 mg | ORAL_TABLET | Freq: Once | ORAL | Status: AC
  Administered 2022-01-07: 1000 mg via ORAL
  Filled 2022-01-07: qty 2

## 2022-01-07 MED ORDER — ONDANSETRON HCL 4 MG/2ML IJ SOLN
4.0000 mg | Freq: Three times a day (TID) | INTRAMUSCULAR | Status: DC | PRN
  Administered 2022-01-07: 4 mg via INTRAVENOUS
  Filled 2022-01-07: qty 2

## 2022-01-07 MED ORDER — ONDANSETRON HCL 4 MG/2ML IJ SOLN
4.0000 mg | Freq: Once | INTRAMUSCULAR | Status: AC
Start: 2022-01-07 — End: 2022-01-07
  Administered 2022-01-07: 4 mg via INTRAVENOUS
  Filled 2022-01-07: qty 2

## 2022-01-07 NOTE — Plan of Care (Signed)
  Problem: Education: Goal: Ability to describe self-care measures that may prevent or decrease complications (Diabetes Survival Skills Education) will improve Outcome: Progressing Goal: Individualized Educational Video(s) Outcome: Progressing   Problem: Coping: Goal: Ability to adjust to condition or change in health will improve Outcome: Progressing   Problem: Fluid Volume: Goal: Ability to maintain a balanced intake and output will improve Outcome: Progressing   Problem: Health Behavior/Discharge Planning: Goal: Ability to identify and utilize available resources and services will improve Outcome: Progressing Goal: Ability to manage health-related needs will improve Outcome: Progressing   Problem: Metabolic: Goal: Ability to maintain appropriate glucose levels will improve Outcome: Progressing   Problem: Nutritional: Goal: Maintenance of adequate nutrition will improve Outcome: Progressing Goal: Progress toward achieving an optimal weight will improve Outcome: Progressing   Problem: Skin Integrity: Goal: Risk for impaired skin integrity will decrease Outcome: Progressing   Problem: Tissue Perfusion: Goal: Adequacy of tissue perfusion will improve Outcome: Progressing   Problem: Education: Goal: Knowledge of General Education information will improve Description: Including pain rating scale, medication(s)/side effects and non-pharmacologic comfort measures Outcome: Progressing   Problem: Health Behavior/Discharge Planning: Goal: Ability to manage health-related needs will improve Outcome: Progressing   Problem: Clinical Measurements: Goal: Ability to maintain clinical measurements within normal limits will improve Outcome: Progressing Goal: Will remain free from infection Outcome: Progressing Goal: Diagnostic test results will improve Outcome: Progressing Goal: Respiratory complications will improve Outcome: Progressing Goal: Cardiovascular complication will  be avoided Outcome: Progressing   Problem: Activity: Goal: Risk for activity intolerance will decrease Outcome: Progressing   Problem: Coping: Goal: Level of anxiety will decrease Outcome: Progressing   Problem: Nutrition: Goal: Adequate nutrition will be maintained Outcome: Progressing   Problem: Elimination: Goal: Will not experience complications related to bowel motility Outcome: Progressing Goal: Will not experience complications related to urinary retention Outcome: Progressing   Problem: Pain Managment: Goal: General experience of comfort will improve Outcome: Progressing   Problem: Safety: Goal: Ability to remain free from injury will improve Outcome: Progressing   Problem: Skin Integrity: Goal: Risk for impaired skin integrity will decrease Outcome: Progressing

## 2022-01-07 NOTE — Progress Notes (Signed)
Subjective:   Hospital day: 1  Overnight event: No acute events overnight, patient still on EndoTool  Interim History: Patient evaluated at bedside with lab in the room attempting lab draw. Patient seems to be in mild respiratory distress with Kussmaul breathing. Patient seemed drowsy and endorse some nausea and abdominal pain. Phlebotomy, unable to draw lab on second attempt.  Patient reevaluated in the afternoon at bedside with RN in the room. Per RN, lab unable to draw back blood after 3 attempts. Patient still drowsy on exam. Reports some nausea and generalized abdominal pain.  Objective:  Vital signs in last 24 hours: Vitals:   01/07/22 0100 01/07/22 0200 01/07/22 0202 01/07/22 0400  BP: (!) 140/57 (!) 135/57  (!) 130/50  Pulse: (!) 110 (!) 106 (!) 108 94  Resp: (!) 27 (!) '22 19 20  '$ Temp:  98.4 F (36.9 C)  98.9 F (37.2 C)  TempSrc:  Oral  Oral  SpO2: 99% 97% 98%   Weight:      Height:        Filed Weights   01/07/22 0000  Weight: 80.2 kg     Intake/Output Summary (Last 24 hours) at 01/07/2022 0706 Last data filed at 01/07/2022 0459 Gross per 24 hour  Intake 1294.54 ml  Output --  Net 1294.54 ml   Net IO Since Admission: 1,294.54 mL [01/07/22 0706]  Recent Labs    01/07/22 0423 01/07/22 0523 01/07/22 0621  GLUCAP 190* 195* 200*     Pertinent Labs:    Latest Ref Rng & Units 01/06/2022    8:28 PM 01/06/2022    6:45 PM 10/05/2021    1:22 AM  CBC  WBC 4.0 - 10.5 K/uL  17.3  6.6   Hemoglobin 12.0 - 15.0 g/dL 11.9  11.7  10.9   Hematocrit 36.0 - 46.0 % 35.0  39.3  35.1   Platelets 150 - 400 K/uL  358  248        Latest Ref Rng & Units 01/07/2022    1:16 AM 01/06/2022    8:28 PM 01/06/2022    6:45 PM  CMP  Glucose 70 - 99 mg/dL 333   733   BUN 6 - 20 mg/dL 39   36   Creatinine 0.44 - 1.00 mg/dL 2.05   2.34   Sodium 135 - 145 mmol/L 140  138  137   Potassium 3.5 - 5.1 mmol/L 4.8  4.8  5.5   Chloride 98 - 111 mmol/L 108   98   CO2 22 - 32 mmol/L 15    10   Calcium 8.9 - 10.3 mg/dL 9.2   9.6     Imaging: DG Chest Portable 1 View  Result Date: 01/06/2022 CLINICAL DATA:  Altered level of consciousness, hyperglycemia EXAM: PORTABLE CHEST 1 VIEW COMPARISON:  10/03/2021 FINDINGS: Single frontal view of the chest demonstrates stable postsurgical changes from CABG. Cardiac silhouette is unremarkable. No acute airspace disease, effusion, or pneumothorax. IMPRESSION: 1. No acute intrathoracic process. Electronically Signed   By: Randa Ngo M.D.   On: 01/06/2022 19:24    Physical Exam  General: Somnolent middle-aged woman laying in bed.  Mild respiratory distress. CV: Mild tachycardia. Regular rhythm. No m/r/g. Trace BLE edema Pulmonary: On room air.  Tachypneic with Kussmaul breathing.  Mildly increased WOB.  Abdominal: Soft.  Mildly distended. Mild generalized tenderness to palpation. Extremities: 2+ distal pulses. Normal ROM. Skin: Warm and dry. No obvious rash or lesions. Skin tightness in the lower extremities.  Neuro: Drowsy, answers questions appropriately.  Moves all extremities.  Normal sensation to gross touch.  Psych: Normal mood and affect   Assessment/Plan: Alayja Armas is a 56 y.o. female with hx PMH of 1 diabetes who presented with 2 days of abdominal pain, nausea, and vomiting and admit for DKA on hospital day 1  Principal Problem:   DKA (diabetic ketoacidosis) ([place])  DKA Uncontrolled T1DM Diabetic neuropathy Gastroparesis Patient with a history of type 1 diabetes and DKA presented with abdominal pain, N/V and found to be in DKA.  Started on Endo tool overnight with improvement in blood sugars to the 100s.  Last BMP collected on 0116 showed K+ 5.5->4.8, bicarb 10->15, sCr 2.34->2.05, glucose 733->333 and anion gap improved from 29->17. BHB improved from >8.00 to 2.24.  Patient found to have Kussmaul breathing on exam today. She remains hemodynamically stable on the Endo tool.  No recent labs to monitor her acidosis due to  lab unable to draw back blood after 3 attempts. IV team consulted but states they are unable to place a PICC line in this patient due to difficult access in the past requiring IR PICC placement. Also doe snot think EJ is appropriate as pt has one PIV. IR consulted for PICC line but unable to place PICC line over the weekend unless it is emergent. IR does not feel this is an emergent case, recommend bedside line placement if IV access for lab monitoring is needed today.  PCCM consulted for central line placement but does not deem patient appropriate since patient is hemodynamically stable and on Eliquis.  IV team will try to evaluate patient to see if midline will be appropriate. --Pending PICC line placement by IR tomorrow -- IV team following, appreciate assistance --Continue Endo tool --Continue D5 LR at 125 mL/h --Closely monitor respiratory status. --Q4H BMP, Q8H BHB  AKI on CKD3b Baseline creatinine around 1.4-1.5. Creatinine elevated to 2.34 on admission in the setting of dehydration from DKA and osmotic diuresis. Creatinine improved to 2.05 this morning. We will continue IV hydration continue to monitor kidney function closely. -Continue IV fluid per DKA protocol -Pending repeat BMP -Avoid nephrotoxic agent -Strict I&O's  HTN HFpEF CAD s/p CABG Last echo 04/2021 showed EF 55-60%. Patient with mild increased work of breathing on exam. Patient's blood pressure slowly increasing with SBP in the 120s to 150s. HR remain mildly elevated in the 90s. -Will resume metoprolol 25 mg twice daily if HR >100 -Continue rosuvastatin 10 mg daily and ASA 81 mg daily -Continue to hold losartan and Lasix in the setting of AKI  QTc prolongation Patient with a history of QT prolongation in the setting of daily use of metoclopramide use for gastroparesis.  Repeat EKG showed QTc improved to 455. -Telemetry -Zofran 4 mg q8h PRN  IDA Has a history of iron deficiency anemia but no recent iron studies on  file.  Hemoglobin 11.7 on admission. -Repeat iron studies -Daily CBC  Diet: NPO, CM diet when off Endo Tool IVF: IVLR VTE: Xarelto CODE: Full Code  Prior to Admission Living Arrangement: Home Anticipated Discharge Location: Home Barriers to Discharge: Medical Stable  Dispo: Anticipated discharge in approximately 1-2 day(s).   Signed: Lacinda Axon, MD 01/07/2022, 7:06 AM  Pager: [phone] Internal Medicine Teaching Service After 5pm on weekdays and 1pm on weekends: On Call pager: [phone]

## 2022-01-07 NOTE — Plan of Care (Signed)
  Problem: Coping: Goal: Ability to adjust to condition or change in health will improve Outcome: Progressing   Problem: Education: Goal: Ability to describe self-care measures that may prevent or decrease complications (Diabetes Survival Skills Education) will improve Outcome: Progressing Goal: Individualized Educational Video(s) Outcome: Progressing   Problem: Fluid Volume: Goal: Ability to maintain a balanced intake and output will improve Outcome: Progressing   Problem: Health Behavior/Discharge Planning: Goal: Ability to identify and utilize available resources and services will improve Outcome: Progressing Goal: Ability to manage health-related needs will improve Outcome: Progressing   Problem: Metabolic: Goal: Ability to maintain appropriate glucose levels will improve Outcome: Progressing   Problem: Nutritional: Goal: Maintenance of adequate nutrition will improve Outcome: Progressing Goal: Progress toward achieving an optimal weight will improve Outcome: Progressing   Problem: Skin Integrity: Goal: Risk for impaired skin integrity will decrease Outcome: Progressing   Problem: Tissue Perfusion: Goal: Adequacy of tissue perfusion will improve Outcome: Progressing   Problem: Education: Goal: Knowledge of General Education information will improve Description: Including pain rating scale, medication(s)/side effects and non-pharmacologic comfort measures Outcome: Progressing   Problem: Health Behavior/Discharge Planning: Goal: Ability to manage health-related needs will improve Outcome: Progressing   Problem: Clinical Measurements: Goal: Ability to maintain clinical measurements within normal limits will improve Outcome: Progressing Goal: Will remain free from infection Outcome: Progressing Goal: Diagnostic test results will improve Outcome: Progressing Goal: Respiratory complications will improve Outcome: Progressing Goal: Cardiovascular complication will  be avoided Outcome: Progressing   Problem: Activity: Goal: Risk for activity intolerance will decrease Outcome: Progressing   Problem: Coping: Goal: Level of anxiety will decrease Outcome: Progressing   Problem: Nutrition: Goal: Adequate nutrition will be maintained Outcome: Progressing   Problem: Elimination: Goal: Will not experience complications related to bowel motility Outcome: Progressing Goal: Will not experience complications related to urinary retention Outcome: Progressing   Problem: Pain Managment: Goal: General experience of comfort will improve Outcome: Progressing   Problem: Safety: Goal: Ability to remain free from injury will improve Outcome: Progressing   Problem: Skin Integrity: Goal: Risk for impaired skin integrity will decrease Outcome: Progressing

## 2022-01-07 NOTE — Progress Notes (Signed)
PCCM called by IMTS for central line placement for lab draws.  Patient admitted with DKA, currently has 2 working PIVS but do not draw blood back, and lab has been unable to obtain labs since 0100.    IV team consulted however given history of difficult IV access in past requiring IR PICC placement, last in 2020, deferred consult to IR.    IR called but unable to be performed till Monday.    Chart reviewed and discussed with 2 attendings.  She remains hemodynamically stable, FBG obtainable, and currently is receiving medical therapy's but no labs can be obtained.  She is on eliquis, last dose today.  Given she is higher risk, she will need procedural area for line placement, considered too high risk for bedside with RRT.    Suggest re-evaluation by IV team for US guided IV (given she has been able to resuscitated/ treated) and consider EJ IV vs IR.        Kennieth Rad, ACNP [facility] 01/07/2022, 2:54 PM

## 2022-01-07 NOTE — Progress Notes (Signed)
Pt CBG level at 2348 was 462.

## 2022-01-07 NOTE — Progress Notes (Addendum)
Several conversations with Healthcare team re PICC vs PIV for lab needs. Spoke with Dr Coy Saunas, who requests to assess for any line that could possibly be placed, PICC or midline. 3 IV/PICC nurses, Self, Mickel Baas cornett RN and Jule Economy RN, at bedside assessing BUE for IV access.  No suitable veins noted for PIV/midline or PICC placement.  Veins either small, noncompressible or of poor definition.  Noted to have significant collateral circulation. Left arm PIV infusing.  R chest wall PIV saline locked with blood return present.  Dr Coy Saunas notified via securechat of assessment without attempts.

## 2022-01-08 DIAGNOSIS — E0810 Diabetes mellitus due to underlying condition with ketoacidosis without coma: Secondary | ICD-10-CM

## 2022-01-08 DIAGNOSIS — R0682 Tachypnea, not elsewhere classified: Secondary | ICD-10-CM

## 2022-01-08 DIAGNOSIS — I4581 Long QT syndrome: Secondary | ICD-10-CM

## 2022-01-08 DIAGNOSIS — N183 Chronic kidney disease, stage 3 unspecified: Secondary | ICD-10-CM

## 2022-01-08 DIAGNOSIS — I129 Hypertensive chronic kidney disease with stage 1 through stage 4 chronic kidney disease, or unspecified chronic kidney disease: Secondary | ICD-10-CM

## 2022-01-08 DIAGNOSIS — Z794 Long term (current) use of insulin: Secondary | ICD-10-CM

## 2022-01-08 LAB — RESPIRATORY PANEL BY PCR

## 2022-01-08 LAB — GLUCOSE, CAPILLARY
Glucose-Capillary: 138 mg/dL — ABNORMAL HIGH (ref 70–99)
Glucose-Capillary: 139 mg/dL — ABNORMAL HIGH (ref 70–99)
Glucose-Capillary: 147 mg/dL — ABNORMAL HIGH (ref 70–99)
Glucose-Capillary: 152 mg/dL — ABNORMAL HIGH (ref 70–99)
Glucose-Capillary: 158 mg/dL — ABNORMAL HIGH (ref 70–99)
Glucose-Capillary: 162 mg/dL — ABNORMAL HIGH (ref 70–99)
Glucose-Capillary: 163 mg/dL — ABNORMAL HIGH (ref 70–99)
Glucose-Capillary: 170 mg/dL — ABNORMAL HIGH (ref 70–99)
Glucose-Capillary: 172 mg/dL — ABNORMAL HIGH (ref 70–99)
Glucose-Capillary: 177 mg/dL — ABNORMAL HIGH (ref 70–99)
Glucose-Capillary: 181 mg/dL — ABNORMAL HIGH (ref 70–99)
Glucose-Capillary: 181 mg/dL — ABNORMAL HIGH (ref 70–99)
Glucose-Capillary: 191 mg/dL — ABNORMAL HIGH (ref 70–99)
Glucose-Capillary: 198 mg/dL — ABNORMAL HIGH (ref 70–99)
Glucose-Capillary: 199 mg/dL — ABNORMAL HIGH (ref 70–99)
Glucose-Capillary: 221 mg/dL — ABNORMAL HIGH (ref 70–99)
Glucose-Capillary: 462 mg/dL — ABNORMAL HIGH (ref 70–99)

## 2022-01-08 LAB — CBC WITH DIFFERENTIAL/PLATELET
Abs Immature Granulocytes: 0.05 10*3/uL (ref 0.00–0.07)
Basophils Absolute: 0.1 10*3/uL (ref 0.0–0.1)
Basophils Relative: 1 %
Eosinophils Absolute: 0.1 10*3/uL (ref 0.0–0.5)
Eosinophils Relative: 1 %
HCT: 32.9 % — ABNORMAL LOW (ref 36.0–46.0)
Hemoglobin: 10.3 g/dL — ABNORMAL LOW (ref 12.0–15.0)
Immature Granulocytes: 1 %
Lymphocytes Relative: 16 %
Lymphs Abs: 1.5 10*3/uL (ref 0.7–4.0)
MCH: 25.9 pg — ABNORMAL LOW (ref 26.0–34.0)
MCHC: 31.3 g/dL (ref 30.0–36.0)
MCV: 82.7 fL (ref 80.0–100.0)
Monocytes Absolute: 0.9 10*3/uL (ref 0.1–1.0)
Monocytes Relative: 9 %
Neutro Abs: 7 10*3/uL (ref 1.7–7.7)
Neutrophils Relative %: 72 %
Platelets: 249 10*3/uL (ref 150–400)
RBC: 3.98 MIL/uL (ref 3.87–5.11)
RDW: 14.9 % (ref 11.5–15.5)
WBC: 9.5 10*3/uL (ref 4.0–10.5)
nRBC: 0 % (ref 0.0–0.2)

## 2022-01-08 LAB — FERRITIN: Ferritin: 15 ng/mL (ref 11–307)

## 2022-01-08 LAB — IRON AND TIBC
Iron: 22 ug/dL — ABNORMAL LOW (ref 28–170)
Saturation Ratios: 5 % — ABNORMAL LOW (ref 10.4–31.8)
TIBC: 461 ug/dL — ABNORMAL HIGH (ref 250–450)
UIBC: 439 ug/dL

## 2022-01-08 LAB — BLOOD GAS, ARTERIAL
Acid-Base Excess: 1.4 mmol/L (ref 0.0–2.0)
Bicarbonate: 24.9 mmol/L (ref 20.0–28.0)
Drawn by: 418751
O2 Saturation: 98.6 %
Patient temperature: 37
pCO2 arterial: 35 mmHg (ref 32–48)
pH, Arterial: 7.46 — ABNORMAL HIGH (ref 7.35–7.45)
pO2, Arterial: 84 mmHg (ref 83–108)

## 2022-01-08 LAB — BASIC METABOLIC PANEL
Anion gap: 10 (ref 5–15)
BUN: 23 mg/dL — ABNORMAL HIGH (ref 6–20)
CO2: 20 mmol/L — ABNORMAL LOW (ref 22–32)
Calcium: 9.1 mg/dL (ref 8.9–10.3)
Chloride: 109 mmol/L (ref 98–111)
Creatinine, Ser: 1.39 mg/dL — ABNORMAL HIGH (ref 0.44–1.00)
GFR, Estimated: 45 mL/min — ABNORMAL LOW (ref >60)
Glucose, Bld: 200 mg/dL — ABNORMAL HIGH (ref 70–99)
Potassium: 4.7 mmol/L (ref 3.5–5.1)
Sodium: 139 mmol/L (ref 135–145)

## 2022-01-08 LAB — TSH: TSH: 0.96 u[IU]/mL (ref 0.350–4.500)

## 2022-01-08 MED ORDER — METOPROLOL TARTRATE 12.5 MG HALF TABLET
12.5000 mg | ORAL_TABLET | Freq: Two times a day (BID) | ORAL | Status: DC
  Administered 2022-01-08 – 2022-01-10 (×5): 12.5 mg via ORAL
  Filled 2022-01-08 (×5): qty 1

## 2022-01-08 MED ORDER — INSULIN GLARGINE-YFGN 100 UNIT/ML ~~LOC~~ SOLN
16.0000 [IU] | Freq: Every day | SUBCUTANEOUS | Status: DC
Start: 2022-01-08 — End: 2022-01-10
  Administered 2022-01-08 – 2022-01-09 (×2): 16 [IU] via SUBCUTANEOUS
  Filled 2022-01-08 (×3): qty 0.16

## 2022-01-08 MED ORDER — INSULIN ASPART 100 UNIT/ML IJ SOLN
3.0000 [IU] | Freq: Three times a day (TID) | INTRAMUSCULAR | Status: DC
  Administered 2022-01-09 (×2): 3 [IU] via SUBCUTANEOUS

## 2022-01-08 MED ORDER — ASPIRIN 81 MG PO TBEC
81.0000 mg | DELAYED_RELEASE_TABLET | Freq: Every day | ORAL | Status: DC
  Administered 2022-01-08 – 2022-01-10 (×3): 81 mg via ORAL
  Filled 2022-01-08 (×3): qty 1

## 2022-01-08 MED ORDER — PREGABALIN 75 MG PO CAPS
75.0000 mg | ORAL_CAPSULE | Freq: Two times a day (BID) | ORAL | Status: DC
  Administered 2022-01-08 – 2022-01-10 (×5): 75 mg via ORAL
  Filled 2022-01-08 (×5): qty 1

## 2022-01-08 MED ORDER — HYDROXYZINE HCL 25 MG PO TABS: 25.0000 mg | ORAL_TABLET | Freq: Three times a day (TID) | ORAL | Status: DC | PRN

## 2022-01-08 MED ORDER — INSULIN ASPART 100 UNIT/ML IJ SOLN
0.0000 [IU] | INTRAMUSCULAR | Status: DC
  Administered 2022-01-08: 5 [IU] via SUBCUTANEOUS
  Administered 2022-01-08: 3 [IU] via SUBCUTANEOUS

## 2022-01-08 MED ORDER — BUSPIRONE HCL 10 MG PO TABS
10.0000 mg | ORAL_TABLET | Freq: Three times a day (TID) | ORAL | Status: DC
  Administered 2022-01-08 – 2022-01-10 (×6): 10 mg via ORAL
  Filled 2022-01-08 (×6): qty 1

## 2022-01-08 NOTE — Progress Notes (Addendum)
Subjective:   Summary: [name] is a 56 y.o. year old female currently admitted on the [facility] HD#2 for DKA.  Overnight Events: - Several discussions yesterday about peripheral venous access.  Patient currently has 1 IV intact, but team is unable to draw labs.  Consensus is IR to place midline or PICC today - Continues to be tachypneic overnight and states that her breathing is still her primary concern. - Somnolent and slow to respond during assessment.  Additionally endorses neck pain/lower extremity pain as well as abdominal pain.  Denies recent nausea and vomiting - Mentions that she felt a "slight cold in her throat" before her symptoms started.  Denies fever preceding symptom onset. - ABG and respiratory viral panel ordered this morning due to somnolence and reports of possible mild respiratory symptoms.   Objective:  Vital signs in last 24 hours: Vitals:   01/07/22 2300 01/08/22 0230 01/08/22 0233 01/08/22 0300  BP:      Pulse:   87   Resp:   18   Temp: 98.7 F (37.1 C) 99.1 F (37.3 C)  99.1 F (37.3 C)  TempSrc: Oral Oral  Oral  SpO2:   95%   Weight:      Height:       Supplemental O2: Room Air SpO2: 95 %   Physical Exam:  General: Somnolent middle-aged woman laying in bed.  Slow to respond to questions during assessment.  In moderate distress. CV: Tachycardia. Regular rhythm. No m/r/g Pulmonary: On room air.  Tachypneic with Kussmaul breathing.  Moderately increased WOB.  Abdominal: Soft.  Mildly distended. Generalized tenderness to palpation. Extremities: Moves upper extremities to command, 3 out of 5 strength. Skin: Warm and dry. No obvious rash or lesions. Skin tightness in the lower extremities. Neuro: Drowsy, answers questions appropriately, but slow to respond.  Moves all extremities.    Filed Weights   01/07/22 0000  Weight: 80.2 kg     Intake/Output Summary (Last 24 hours) at 01/08/2022 0609 Last data filed at 01/08/2022  0232 Gross per 24 hour  Intake 363.56 ml  Output 700 ml  Net -336.44 ml   Net IO Since Admission: 958.1 mL [01/08/22 0609]  Pertinent Labs:    Latest Ref Rng & Units 01/06/2022    8:28 PM 01/06/2022    6:45 PM 10/05/2021    1:22 AM  CBC  WBC 4.0 - 10.5 K/uL  17.3  6.6   Hemoglobin 12.0 - 15.0 g/dL 11.9  11.7  10.9   Hematocrit 36.0 - 46.0 % 35.0  39.3  35.1   Platelets 150 - 400 K/uL  358  248        Latest Ref Rng & Units 01/07/2022    2:50 PM 01/07/2022    1:16 AM 01/06/2022    8:28 PM  CMP  Glucose 70 - 99 mg/dL 112  333    BUN 6 - 20 mg/dL 34  39    Creatinine 0.44 - 1.00 mg/dL 1.50  2.05    Sodium 135 - 145 mmol/L 143  140  138   Potassium 3.5 - 5.1 mmol/L 3.9  4.8  4.8   Chloride 98 - 111 mmol/L 115  108    CO2 22 - 32 mmol/L 18  15    Calcium 8.9 - 10.3 mg/dL 8.5  9.2    Total Protein 6.5 - 8.1 g/dL 6.2  Total Bilirubin 0.3 - 1.2 mg/dL 0.5     Alkaline Phos 38 - 126 U/L 91     AST 15 - 41 U/L 22     ALT 0 - 44 U/L 15      Last 3 CBGs 181, 139, 172 Iron 22, TIBC 461, saturation ratio 5, ferritin 15 ABG: pH 7.46, PCO2 35, bicarb 24.9 Respiratory viral panel negative Imaging:   EKG: My EKG interpretation is as follows: Sinus tachycardia with left axis deviation.  ST depressions in V3 to V6 similar to prior EKGs, visible on 10/03/2021.  Assessment/Plan:   Principal Problem:   DKA (diabetic ketoacidosis) ([place])   Patient Summary: [name] is a 56 y.o. with a pertinent PMH of type 1 diabetes, who presented with abdominal pain, nausea and vomiting and admitted for DKA on hospital day 2.    #DKA Uncontrolled T1DM Diabetic neuropathy Gastroparesis Patient with a history of type 1 diabetes and DKA presented with abdominal pain, N/V and found to be in DKA.  Last recorded BMP showing sodium and potassium within normal limits, anion gap of 10. Of note, patient is on insulin pump at home; inpatient diabetes management recommended Semglee 16 units prior to  discontinuing insulin drip and then daily, sliding scale insulin with meals and nightly, and NovoLog 3 units 3 times daily with meals. Because patient appears clinically unwell, will continue with insulin infusion for now until we can confirm the anion gap is resolved. - Obtain IV access, IR following.  Repeat BMP, BHB.  Discontinue insulin drip pending repeat labs and resolution of clinical symptoms and transition to insulin regimen as above. - Continue LR at 125 an hour - We will resume home Lyrica at renally dosed 75 mg twice daily  #Tachypnea Patient continues to have tachycardia despite resolution of acidosis per ABG this morning.  RVP is negative.  EKG yesterday showing no new changes.  Chest x-ray at admission unrevealing of any acute process.  Saturating above 94% on room air. Unclear what is driving the tachypnea at this time.  #AKI on CKD 3 (resolved) Baseline creatinine around 1.4-1.5. Creatinine elevated to 2.34 on admission in the setting of dehydration from DKA and osmotic diuresis.  Last recorded creatinine 1.5 - Continue IV fluids - Trend BMPs - Strict I's and O's  #HTN HFpEF CAD s/p CABG  Last echo 04/2021 showed EF 55-60%.  Patient remains hemodynamically stable with recent systolics in the 161-096 range and pulse 70-80.  We will start resuming her home GDMT today -- Restart home metoprolol tartrate 12.5 twice daily.  Continue to hold home Cozaar and Lasix - Continue Crestor 10  #QTc prolongation Patient with a history of QT prolongation in the setting of daily use of metoclopramide use for gastroparesis.  Multiple repeated EKGs show QTc in the normal range. -Consider restarting home Reglan when patient is no longer n.p.o. -Telemetry -Zofran 4 mg q8h PRN  #IDA Has a history of iron deficiency anemia but no recent iron studies on file.  Hemoglobin 11.7 on admission.  Iron studies concerning for significant iron deficiency anemia.  Once patient has better access we will  replete IV iron and start her on supplements. -Follow-up IV iron after PICC line is placed and start outpatient iron supplementation.  Diet: NPO IVF: LR,125cc/hr VTE:  Xarelto 10 Code: Full PT/OT recs: Pending, none. TOC recs:  Family Update:  Dispo: Anticipated discharge resolution of DKA and stabilization of respiratory status with subsequent PT/OT evaluation.   Lowell Makara [place],  MD PGY-1 Internal Medicine Resident Please contact the on call pager after 5 pm and on weekends at [phone].

## 2022-01-08 NOTE — Progress Notes (Signed)
Respiratory panel order by MD. RN made attempt to swab patient. Patient would not allow. RN education patient on importance of lab. Patient continued to refuse. RN paged MD. Awaiting return call.

## 2022-01-08 NOTE — Inpatient Diabetes Management (Addendum)
Inpatient Diabetes Program Recommendations  AACE/ADA: New Consensus Statement on Inpatient Glycemic Control (2015)  Target Ranges:  Prepandial:   less than 140 mg/dL      Peak postprandial:   less than 180 mg/dL (1-2 hours)      Critically ill patients:  140 - 180 mg/dL   Lab Results  Component Value Date   GLUCAP 139 (H) 01/08/2022   HGBA1C 8.8 (A) 01/02/2022    Review of Glycemic Control  Latest Reference Range & Units 01/08/22 05:31 01/08/22 06:28 01/08/22 07:42 01/08/22 09:58  Glucose-Capillary 70 - 99 mg/dL 147 (H) 170 (H) 172 (H) 139 (H)  (H): Data is abnormally high Diabetes history: Type 1 DM Outpatient Diabetes medications: Omnipod- 0000-0.75, 0900-0.9, 1200-1.2, 1600-0.85= 21.05; ICR 1, ISF 120  Current orders for Inpatient glycemic control: IV insulin  Inpatient Diabetes Program Recommendations:    When ready to transition consider: -Semglee 16 units two hours prior to discontinuation then QD -Novolog 0-9 units TID & HS -Novolog 3 units TID (assuming patient is consuming >50% of meals).   Addendum: Spoke with patient briefly regarding reasons for DKA on presentation. Per patient, she ran out of supplies for her Omnipod and could not get additional supplies in time from endocrinology/pharmacy before going into DKA. Reports settings have not changed since previous visit. Has more supplies at home since event. Wearing Dexcom.  Paged MD team.  Of note, labs including most recent BMET & beta hydroxybutyric acid not indicative of acidosis, however, patient is short of breath and very slow to answer questions. Answers appropriately, however tachypnea significantly interrupting conversation. Discussed with RN and MD team. Question other physiological processes?   Thanks, Bronson Curb, MSN, RNC-OB Diabetes Coordinator [phone] (8a-5p)

## 2022-01-09 DIAGNOSIS — E0810 Diabetes mellitus due to underlying condition with ketoacidosis without coma: Secondary | ICD-10-CM | POA: Diagnosis not present

## 2022-01-09 LAB — CBC
HCT: 35.8 % — ABNORMAL LOW (ref 36.0–46.0)
Hemoglobin: 11.2 g/dL — ABNORMAL LOW (ref 12.0–15.0)
MCH: 25.5 pg — ABNORMAL LOW (ref 26.0–34.0)
MCHC: 31.3 g/dL (ref 30.0–36.0)
MCV: 81.4 fL (ref 80.0–100.0)
Platelets: 255 10*3/uL (ref 150–400)
RBC: 4.4 MIL/uL (ref 3.87–5.11)
RDW: 14.6 % (ref 11.5–15.5)
WBC: 6.3 10*3/uL (ref 4.0–10.5)
nRBC: 0 % (ref 0.0–0.2)

## 2022-01-09 LAB — BASIC METABOLIC PANEL
Anion gap: 12 (ref 5–15)
BUN: 15 mg/dL (ref 6–20)
CO2: 24 mmol/L (ref 22–32)
Calcium: 8.9 mg/dL (ref 8.9–10.3)
Chloride: 106 mmol/L (ref 98–111)
Creatinine, Ser: 1.16 mg/dL — ABNORMAL HIGH (ref 0.44–1.00)
GFR, Estimated: 56 mL/min — ABNORMAL LOW (ref >60)
Glucose, Bld: 130 mg/dL — ABNORMAL HIGH (ref 70–99)
Potassium: 4.1 mmol/L (ref 3.5–5.1)
Sodium: 142 mmol/L (ref 135–145)

## 2022-01-09 LAB — GLUCOSE, CAPILLARY
Glucose-Capillary: 110 mg/dL — ABNORMAL HIGH (ref 70–99)
Glucose-Capillary: 171 mg/dL — ABNORMAL HIGH (ref 70–99)
Glucose-Capillary: 188 mg/dL — ABNORMAL HIGH (ref 70–99)
Glucose-Capillary: 178 mg/dL — ABNORMAL HIGH (ref 70–99)
Glucose-Capillary: 125 mg/dL — ABNORMAL HIGH (ref 70–99)

## 2022-01-09 MED ORDER — INSULIN ASPART 100 UNIT/ML IJ SOLN
5.0000 [IU] | Freq: Three times a day (TID) | INTRAMUSCULAR | Status: DC
Start: 2022-01-09 — End: 2022-01-10
  Administered 2022-01-09 – 2022-01-10 (×3): 5 [IU] via SUBCUTANEOUS

## 2022-01-09 MED ORDER — METOCLOPRAMIDE HCL 5 MG PO TABS
10.0000 mg | ORAL_TABLET | Freq: Three times a day (TID) | ORAL | Status: DC
  Administered 2022-01-09 – 2022-01-10 (×4): 10 mg via ORAL
  Filled 2022-01-09 (×4): qty 2

## 2022-01-09 MED ORDER — INSULIN ASPART 100 UNIT/ML IJ SOLN
0.0000 [IU] | Freq: Three times a day (TID) | INTRAMUSCULAR | Status: DC
  Administered 2022-01-09 – 2022-01-10 (×4): 3 [IU] via SUBCUTANEOUS
  Administered 2022-01-10: 8 [IU] via SUBCUTANEOUS

## 2022-01-09 MED ORDER — FERROUS SULFATE 325 (65 FE) MG PO TABS
325.0000 mg | ORAL_TABLET | Freq: Every day | ORAL | Status: DC
  Administered 2022-01-10: 325 mg via ORAL
  Filled 2022-01-09: qty 1

## 2022-01-09 NOTE — Hospital Course (Signed)
  #  DKA Uncontrolled T1DM Diabetic neuropathy Gastroparesis Patient with a history of type 1 diabetes and DKA presented with abdominal pain, N/V and found to be in DKA.  Last recorded BMP showing sodium and potassium within normal limits, anion gap of 10. Of note, patient is on insulin pump at home; inpatient diabetes management recommended Semglee 16 units prior to discontinuing insulin drip and then daily, sliding scale insulin with meals and nightly, and NovoLog 3 units 3 times daily with meals. Because patient appears clinically unwell, will continue with insulin infusion for now until we can confirm the anion gap is resolved. - Obtain IV access, IR following.  Repeat BMP, BHB.  Discontinue insulin drip pending repeat labs and resolution of clinical symptoms and transition to insulin regimen as above. - Continue LR at 125 an hour - We will resume home Lyrica at renally dosed 75 mg twice daily  #Tachypnea Patient continues to have tachycardia despite resolution of acidosis per ABG this morning.  RVP is negative.  EKG yesterday showing no new changes.  Chest x-ray at admission unrevealing of any acute process.  Saturating above 94% on room air. Unclear what is driving the tachypnea at this time.   #AKI on CKD 3 (resolved) Baseline creatinine around 1.4-1.5. Creatinine elevated to 2.34 on admission in the setting of dehydration from DKA and osmotic diuresis.  Last recorded creatinine 1.5 - Continue IV fluids - Trend BMPs - Strict I's and O's   #HTN HFpEF CAD s/p CABG  Last echo 04/2021 showed EF 55-60%.  Patient remains hemodynamically stable with recent systolics in the 867-672 range and pulse 70-80.  We will start resuming her home GDMT today -- Restart home metoprolol tartrate 12.5 twice daily.  Continue to hold home Cozaar and Lasix - Continue Crestor 10   #QTc prolongation Patient with a history of QT prolongation in the setting of daily use of metoclopramide use for gastroparesis.   Multiple repeated EKGs show QTc in the normal range. -Consider restarting home Reglan when patient is no longer n.p.o. -Telemetry -Zofran 4 mg q8h PRN   #IDA Has a history of iron deficiency anemia but no recent iron studies on file.  Hemoglobin 11.7 on admission.  Iron studies concerning for significant iron deficiency anemia.  Once patient has better access we will replete IV iron and start her on supplements. -Follow-up IV iron after PICC line is placed and start outpatient iron supplementation.

## 2022-01-09 NOTE — Progress Notes (Signed)
RN received a call from radiology for transport for PICC.  RN went into room to prepare patient for transport. Pt would not agree to go down for PICC. MD notified. RN awaiting instructions.

## 2022-01-09 NOTE — Progress Notes (Signed)
Subjective:   Summary: [name] is a 56 y.o. year old female currently admitted on the IMTS HD#3 for DKA.  Overnight Events: -IR line placement deferred, patient was able to get labs drawn this morning -Transitioned to subq insulin yesterday afternoon, diet started -Patient reporting 1 episode of loose stools yesterday after eating dinner.  Discussed that this is not consistent with diarrhea that we would treat, especially given her gastroparesis.  We restarted Reglan this morning and we reassured her that we will continue to monitor. - Patient mentions that she will need her walker from her home and that her sister will likely need to get this for she can be discharged. - Patient endorses some anxiety but denies chest pain, abdominal pain, nausea or vomiting.  She feels like her breathing has improved and that she is no longer confused.  Objective:  Vital signs in last 24 hours: Vitals:   01/09/22 0329 01/09/22 0807 01/09/22 0956 01/09/22 1143  BP: (!) 106/48 (!) 141/70 110/75 129/87  Pulse:  72  70  Resp: '16 20  17  '$ Temp: 98.6 F (37 C) (!) 97.4 F (36.3 C)  97.8 F (36.6 C)  TempSrc: Oral Oral  Oral  SpO2:  93%  94%  Weight:      Height:       Supplemental O2: Room Air SpO2: 94 % (range 94 to 96%) Tachypnea appears to have resolved overnight Maps of 65 and 69 overnight but increased during the day after she started eating  Physical Exam:  General: Well-appearing middle-aged woman laying in bed.  Pleasant and intereactive.   CV: RRR. No m/r/g Pulmonary: Regular work of breathing.  Lungs clear to auscultation bilaterally Abdominal: Soft.  Nondistended.  Nontender  Extremities: Warm and well-perfused.  No lower extremity edema noted. Psych: Endorses anxiety   Filed Weights   01/07/22 0000  Weight: 80.2 kg     Intake/Output Summary (Last 24 hours) at 01/09/2022 1545 Last data filed at 01/09/2022 0925 Gross per 24 hour  Intake 480 ml  Output  300 ml  Net 180 ml   Net IO Since Admission: 1,138.1 mL [01/09/22 1545]  Pertinent Labs:    Latest Ref Rng & Units 01/09/2022    7:13 AM 01/08/2022   12:18 PM 01/06/2022    8:28 PM  CBC  WBC 4.0 - 10.5 K/uL 6.3  9.5    Hemoglobin 12.0 - 15.0 g/dL 11.2  10.3  11.9   Hematocrit 36.0 - 46.0 % 35.8  32.9  35.0   Platelets 150 - 400 K/uL 255  249         Latest Ref Rng & Units 01/09/2022    7:13 AM 01/08/2022   12:18 PM 01/07/2022    2:50 PM  CMP  Glucose 70 - 99 mg/dL 130  200  112   BUN 6 - 20 mg/dL 15  23  34   Creatinine 0.44 - 1.00 mg/dL 1.16  1.39  1.50   Sodium 135 - 145 mmol/L 142  139  143   Potassium 3.5 - 5.1 mmol/L 4.1  4.7  3.9   Chloride 98 - 111 mmol/L 106  109  115   CO2 22 - 32 mmol/L '24  20  18   '$ Calcium 8.9 - 10.3 mg/dL 8.9  9.1  8.5   Total Protein 6.5 - 8.1 g/dL   6.2   Total Bilirubin  0.3 - 1.2 mg/dL   0.5   Alkaline Phos 38 - 126 U/L   91   AST 15 - 41 U/L   22   ALT 0 - 44 U/L   15    Last 3 CBGs 188, 171, 110 Iron 22, TIBC 461, saturation ratio 5, ferritin 15  Imaging:    Assessment/Plan:   Principal Problem:   DKA (diabetic ketoacidosis) ([place])   Patient Summary: [name] is a 56 y.o. with a pertinent PMH of type 1 diabetes, who presented with abdominal pain, nausea and vomiting and admitted for DKA on hospital day 3.    #DKA Uncontrolled T1DM Diabetic neuropathy Gastroparesis Patient with a history of type 1 diabetes and DKA presented with abdominal pain, N/V and initially found to be in DKA, which is now resolved.  BMP shows gap closed x2 yesterday, transitioned to [facility] 16 daily, NovoLog 3 with meals, plus sliding scale.  CBGs last night and this morning mildly elevated, will increase short acting insulin meal coverage. -Deferred peripheral line placement.  Will contact IR again if necessary. - Insulin Semglee 16 units, NovoLog 5 units with meals, plus sliding scale - Resumed home Reglan today - We will resume home Lyrica at  renally dosed 75 mg twice daily  #Tachypnea (resolved) Patient not tachypneic overnight or this morning and reporting symptomatic improvement.  Satting well on room air.  No concerns at this point.  We will continue to monitor.  #AKI on CKD 3 (resolved) Baseline creatinine around 1.4-1.5. Creatinine elevated to 2.34 on admission in the setting of dehydration from DKA and osmotic diuresis.  Discontinued fluids yesterday and last recorded creatinine 1.16. - Trend BMPs  #HTN HFpEF CAD s/p CABG  Last echo 04/2021 showed EF 55-60%.  Home metoprolol restarted yesterday. Patient remains hemodynamically stable with recent systolics in the 741O to 878M range and pulse 60-70.   -- Continue home metoprolol tartrate 12.5 twice daily. Continue to hold home Cozaar and Lasix - Continue Crestor 10  #QTc prolongation Patient with a history of QT prolongation in the setting of daily use of metoclopramide use for gastroparesis.  Multiple repeated EKGs show QTc in the normal range. -Consider restarting home Reglan when patient is no longer n.p.o. -Telemetry -Zofran 4 mg q8h PRN  #IDA Has a history of iron deficiency anemia but no recent iron studies on file.  Hemoglobin 11.7 on admission.  Iron studies concerning for significant iron deficiency anemia.  We can replete patient's iron with p.o. supplements. - Start p.o. iron supplementation  Diet: Carb modified diet IVF: None VTE:  Xarelto 10 Code: Full PT/OT recs: Home health PT and OT TOC recs:  Family Update:  Dispo: Anticipated discharge tomorrow with home health PT and OT.   Linus Galas, MD PGY-1 Internal Medicine Resident Please contact the on call pager after 5 pm and on weekends at [phone].

## 2022-01-09 NOTE — Evaluation (Addendum)
Occupational Therapy Evaluation Patient Details Name: [name] MRN: [id number] DOB: 1966-05-06 Today's Date: 01/09/2022   History of Present Illness Pt is a 56 y/o female admitted secondary to DKA. PMH includes Type1 DM, HTN, CAD s/p CABG, and CKD.   Clinical Impression   Pt reports that she is typically independent in ADL and mobility with RW. She has DME for bathing (tub bench). She reports that she does her own cooking and cleaning. Today she requires increased time for all aspects of ADL and transfers with RW. She is min guard assist - vc for safe hand placement. She was bathing when OT entered the room and required/requested assist for below the knees and her back. She also required assist for opening containers/sauce packets with lunch. She states this is because of all the things we have attached to her, not fine motor deficits. Pt was not hooked to telemetry during session, but after bathing all vitals WFL. At this time recommending [place] post-acute to maximize safety and independence in ADL and functional transfers as well as energy conservation and maximizing safety in the home environment. Pt is resistant to the idea of [place] and is likely to refuse. OT will continue to follow acutely.    Addendum After speaking with Patient, she is more open to utilizing [facility] services. She has had bad experiences in the past. Talked about setting mutual goals with therapist and then she shared that what she REALLY wants to work on is getting her own socks and shoes on without AE (she has a sock donner at home). Please focus on that during next session as well   Recommendations for follow up therapy are one component of a multi-disciplinary discharge planning process, led by the attending physician.  Recommendations may be updated based on patient status, additional functional criteria and insurance authorization.   Follow Up Recommendations  Home health OT    Assistance Recommended at Discharge  Intermittent Supervision/Assistance  Patient can return home with the following A little help with walking and/or transfers;A little help with bathing/dressing/bathroom;Assistance with cooking/housework;Direct supervision/assist for medications management;Assist for transportation;Help with stairs or ramp for entrance    Functional Status Assessment  Patient has had a recent decline in their functional status and demonstrates the ability to make significant improvements in function in a reasonable and predictable amount of time.  Equipment Recommendations  None recommended by OT (Pt has appropriate DME)    Recommendations for Other Services PT consult     Precautions / Restrictions Precautions Precautions: Fall Precaution Comments: R foot drop Restrictions Weight Bearing Restrictions: No      Mobility Bed Mobility               General bed mobility comments: in recliner at beginning and end of session    Transfers Overall transfer level: Needs assistance Equipment used: Rolling walker (2 wheels) Transfers: Sit to/from Stand Sit to Stand: Min guard, Min assist           General transfer comment: Min guard for safety, vc for safe hand placement, min A for boost and line management      Balance Overall balance assessment: Needs assistance Sitting-balance support: No upper extremity supported, Feet supported Sitting balance-Leahy Scale: Good     Standing balance support: Bilateral upper extremity supported Standing balance-Leahy Scale: Poor Standing balance comment: Reliant on BUE support  ADL either performed or assessed with clinical judgement   ADL Overall ADL's : Needs assistance/impaired Eating/Feeding: Minimal assistance;Sitting Eating/Feeding Details (indicate cue type and reason): assist to open containers, awareness of utensils Grooming: Set up;Sitting   Upper Body Bathing: Sitting;Minimal assistance Upper Body  Bathing Details (indicate cue type and reason): for back Lower Body Bathing: Minimal assistance;Sitting/lateral leans Lower Body Bathing Details (indicate cue type and reason): below the knees Upper Body Dressing : Minimal assistance Upper Body Dressing Details (indicate cue type and reason): to tie behind back Lower Body Dressing: Minimal assistance   Toilet Transfer: Minimal assistance;Rolling walker (2 wheels)   Toileting- Clothing Manipulation and Hygiene: Minimal assistance;Sit to/from stand   Tub/ Shower Transfer: Minimal assistance;Tub bench;Rolling walker (2 wheels)   Functional mobility during ADLs: Min guard;Minimal assistance General ADL Comments: decreased safety awareness, decreased activity tolerance     Vision         Perception     Praxis      Pertinent Vitals/Pain Pain Assessment Pain Assessment: Faces Faces Pain Scale: Hurts little more Pain Location: RLE Pain Descriptors / Indicators: Grimacing, Guarding Pain Intervention(s): Limited activity within patient's tolerance, Monitored during session, Repositioned     Hand Dominance Right   Extremity/Trunk Assessment Upper Extremity Assessment Upper Extremity Assessment: Generalized weakness (decreased fine motor "due to lines")   Lower Extremity Assessment Lower Extremity Assessment: Defer to PT evaluation RLE Deficits / Details: Reports RLE deficits at baseline and R foot deficits.   Cervical / Trunk Assessment Cervical / Trunk Assessment: Kyphotic   Communication Communication Communication: No difficulties   Cognition Arousal/Alertness: Awake/alert Behavior During Therapy: WFL for tasks assessed/performed Overall Cognitive Status: No family/caregiver present to determine baseline cognitive functioning Area of Impairment: Safety/judgement, Awareness, Problem solving                         Safety/Judgement: Decreased awareness of safety, Decreased awareness of deficits Awareness:  Emergent Problem Solving: Slow processing, Difficulty sequencing, Requires verbal cues       General Comments  off telemetry during bathing    Exercises     Shoulder Instructions      Home Living Family/patient expects to be discharged to:: Private residence Living Arrangements: Alone Available Help at Discharge: Family;Available PRN/intermittently;Friend(s) Type of Home: Apartment Home Access: Level entry     Home Layout: One level     Bathroom Shower/Tub: Teacher, early years/pre: Standard     Home Equipment: Conservation officer, nature (2 wheels);Tub bench;BSC/3in1          Prior Functioning/Environment Prior Level of Function : Needs assist             Mobility Comments: Uses RW for mobility tasks ADLs Comments: uses tub bench; pushes up from counter to get off toilet bc BSC cannot fit in bathroom with tub bench there; takes medicare transportation to doctor appointments; friends help with some transportation (i.e. grocery store)        OT Problem List: Decreased strength;Decreased activity tolerance;Impaired balance (sitting and/or standing);Decreased safety awareness;Decreased knowledge of use of DME or AE;Pain      OT Treatment/Interventions: Self-care/ADL training;Energy conservation;DME and/or AE instruction;Therapeutic activities;Patient/family education;Balance training    OT Goals(Current goals can be found in the care plan section) Acute Rehab OT Goals Patient Stated Goal: get home OT Goal Formulation: With patient Time For Goal Achievement: 01/23/22 Potential to Achieve Goals: Good ADL Goals Pt Will Perform Grooming: with modified independence;standing Pt Will Perform  Upper Body Dressing: with modified independence;sitting Pt Will Perform Lower Body Dressing: with modified independence;sit to/from stand Pt Will Transfer to Toilet: with modified independence;ambulating Pt Will Perform Toileting - Clothing Manipulation and hygiene: with modified  independence;sit to/from stand Additional ADL Goal #1: Pt will verbalize 3 ways of conserving energy during ADL routine with no cues  OT Frequency: Min 2X/week    Co-evaluation              AM-PAC OT "6 Clicks" Daily Activity     Outcome Measure Help from another person eating meals?: A Little Help from another person taking care of personal grooming?: A Little Help from another person toileting, which includes using toliet, bedpan, or urinal?: A Little Help from another person bathing (including washing, rinsing, drying)?: A Little Help from another person to put on and taking off regular upper body clothing?: A Little Help from another person to put on and taking off regular lower body clothing?: A Little 6 Click Score: 18   End of Session Equipment Utilized During Treatment: Rolling walker (2 wheels) Nurse Communication: Mobility status;Precautions  Activity Tolerance: Patient limited by fatigue Patient left: in chair;with call bell/phone within reach  OT Visit Diagnosis: Unsteadiness on feet (R26.81);Muscle weakness (generalized) (M62.81);Pain Pain - Right/Left: Right Pain - part of body: Leg                Time: 9774-1423 OT Time Calculation (min): 24 min Charges:  OT General Charges $OT Visit: 1 Visit OT Evaluation $OT Eval Moderate Complexity: 1 Mod OT Treatments $Self Care/Home Management : 8-22 mins  Jesse Sans OTR/L Acute Rehabilitation Services Office: [place] 01/09/2022, 12:08 PM

## 2022-01-09 NOTE — TOC Initial Note (Signed)
Transition of Care [facility]) - Initial/Assessment Note    Patient Details  Name: [name] MRN: [id number] Date of Birth: [birth year]  [facility]) CM/SW Contact:    Angelita Ingles, RN Phone [phone]  01/09/2022, 3:33 PM  Clinical Narrative:                 TOC following for patient admitted with abdominal pain, nausea and vomiting. Patient states that she ifs from home alone where she independently cares for herself. Patient states that she follows up with Dr. Margarita Rana at family medicine. CM discussed home health options with patient per PT recommendations. Patient states that she has had home health in the past and she really didn't see what they did to help. CM has explained the process for home health and advised patient that this is a recommendation. Patient inquires if she has to agree to home health in order to discharge. CM has advised patient again that [facility] is a recommendation and that patient can not be forced to do anything. CM made patient aware that she has a choice and that TOC is here to help set up home health if that is her choice. Patient wants to wait and think about home health services. CM has provided patient with medicare.gov list of home health agencies and CM left cell number for patient to call when she makes a decision. No home health services have been arranged.     Barriers to Discharge: Continued Medical Work up   Patient Goals and CMS Choice        Expected Discharge Plan and Services                                                Prior Living Arrangements/Services                       Activities of Daily Living      Permission Sought/Granted                  Emotional Assessment              Admission diagnosis:  DKA (diabetic ketoacidosis) ([place]) [E11.10] AKI (acute kidney injury) ([place]) [N17.9] Diabetic ketoacidosis without coma associated with type 1 diabetes mellitus ([place]) [E10.10] Patient  Active Problem List   Diagnosis Date Noted   Pressure injury of skin 10/05/2021   Achilles tendon contracture, right 11/30/2020   Closed fracture of proximal end of femur, right, initial encounter ([place]) 05/09/2019   Body mass index (BMI) 30.0-30.9, adult 04/26/2019   Hypertensive chronic kidney disease w stg 1-4/unsp chr kdny 04/26/2019   Obesity, unspecified 04/26/2019   CAD (coronary artery disease) 03/06/2019   Acute encephalopathy 12/22/2018   Presence of aortocoronary bypass graft 06/25/2018   Diabetic ketoacidosis ([place]) 04/30/2018   Gastroparesis 04/13/2018   DKA (diabetic ketoacidosis) ([place]) 03/31/2018   S/P CABG x 3 01/14/2018   CAD (coronary artery disease), native coronary artery 01/11/2018   Dehydration    MDD (major depressive disorder), single episode, severe , no psychosis ([place])    DKA (diabetic ketoacidoses) 06/16/2017   Tardive dyskinesia    Type 1 diabetes mellitus with hyperlipidemia ([place]) 03/18/2016   Noncompliance with treatment plan 03/13/2016   Essential hypertension 01/24/2016   HLD (hyperlipidemia)    GERD (gastroesophageal reflux disease)  AKI (acute kidney injury) ([place])    Anemia, iron deficiency    Vitamin B12 deficiency 08/16/2015   Diabetic gastroparesis ([place])    Diabetic neuropathy, type I diabetes mellitus ([place]) 05/18/2015   Anxiety and depression 05/18/2015   PCP:  [name], MD Pharmacy:   [id number] [address], [place] AT [address] [place] [address] Phone: [phone] Fax: [place], [place] - [address] [place] [address] Phone: [phone] Fax: [phone]  [facility] [address] [place] [address] Phone: [phone] Fax: [phone]     Social Determinants of Health (SDOH) Interventions    Readmission Risk  Interventions    01/09/2022    3:29 PM  Readmission Risk Prevention Plan  Transportation Screening Complete  PCP or Specialist Appt within 5-7 Days Complete  Home Care Screening Complete  Medication Review (RN CM) Referral to Pharmacy

## 2022-01-09 NOTE — Progress Notes (Signed)
Sent transport to bring pt down to IR for PICC placement. Pt refusing to come down.

## 2022-01-09 NOTE — Care Management Important Message (Signed)
Important Message  Patient Details  Name: [name] MRN: [id number] Date of Birth: Sep 12, 1965   Medicare Important Message Given:  Yes     Farley Crooker 01/09/2022, 2:23 PM

## 2022-01-09 NOTE — Evaluation (Signed)
Physical Therapy Evaluation Patient Details Name: [name] MRN: [id number] DOB: 1965-08-02 Today's Date: 01/09/2022  History of Present Illness  Pt is a 56 y/o female admitted secondary to DKA. PMH includes Type1 DM, HTN, CAD s/p CABG, and CKD.  Clinical Impression  Pt admitted secondary to problem above with deficits below. Pt requiring min guard A for mobility tasks using RW. Pt reporting her stomach didn't feel well, so wanting to limit mobility within the room. Discussed HHPT, but pt will likely refuse. Will continue to follow acutely.        Recommendations for follow up therapy are one component of a multi-disciplinary discharge planning process, led by the attending physician.  Recommendations may be updated based on patient status, additional functional criteria and insurance authorization.  Follow Up Recommendations Home health PT [facility] for med management-Pt will likely refuse)      Assistance Recommended at Discharge Intermittent Supervision/Assistance  Patient can return home with the following  Direct supervision/assist for medications management;Assist for transportation;Assistance with cooking/housework    Equipment Recommendations None recommended by PT  Recommendations for Other Services       Functional Status Assessment Patient has had a recent decline in their functional status and demonstrates the ability to make significant improvements in function in a reasonable and predictable amount of time.     Precautions / Restrictions Precautions Precautions: Fall Precaution Comments: R foot drop Restrictions Weight Bearing Restrictions: No      Mobility  Bed Mobility Overal bed mobility: Needs Assistance Bed Mobility: Supine to Sit     Supine to sit: Min assist     General bed mobility comments: Min A for trunk assist to come to sitting    Transfers Overall transfer level: Needs assistance Equipment used: Rolling walker (2 wheels) Transfers: Sit  to/from Stand Sit to Stand: Min guard           General transfer comment: Min guard for safety    Ambulation/Gait Ambulation/Gait assistance: Min guard, Supervision Gait Distance (Feet): 30 Feet Assistive device: Rolling walker (2 wheels) Gait Pattern/deviations: Step-through pattern, Decreased stride length, Decreased weight shift to right Gait velocity: Decreased     General Gait Details: Slow, mildly antalgic gait. Pt reports wanting to stay in the room as she did not feel good. Min guard to supervision for safety.  Stairs            Wheelchair Mobility    Modified Rankin (Stroke Patients Only)       Balance Overall balance assessment: Needs assistance Sitting-balance support: No upper extremity supported, Feet supported Sitting balance-Leahy Scale: Good     Standing balance support: Bilateral upper extremity supported Standing balance-Leahy Scale: Poor Standing balance comment: Reliant on BUE support                             Pertinent Vitals/Pain Pain Assessment Pain Assessment: Faces Faces Pain Scale: Hurts little more Pain Location: RLE Pain Descriptors / Indicators: Grimacing, Guarding Pain Intervention(s): Monitored during session, Limited activity within patient's tolerance, Repositioned    Home Living Family/patient expects to be discharged to:: Private residence Living Arrangements: Alone Available Help at Discharge: Family;Available PRN/intermittently;Friend(s) Type of Home: Apartment Home Access: Level entry       Home Layout: One level Home Equipment: Conservation officer, nature (2 wheels);Tub bench;BSC/3in1      Prior Function Prior Level of Function : Needs assist  Mobility Comments: Uses RW for mobility tasks ADLs Comments: uses tub bench; pushes up from counter to get off toilet bc BSC cannot fit in bathroom with tub bench there; takes medicare transportation to doctor appointments; friends help with some  transportation (i.e. grocery store)     Hand Dominance        Extremity/Trunk Assessment   Upper Extremity Assessment Upper Extremity Assessment: Defer to OT evaluation    Lower Extremity Assessment Lower Extremity Assessment: RLE deficits/detail RLE Deficits / Details: Reports RLE deficits at baseline and R foot deficits.    Cervical / Trunk Assessment Cervical / Trunk Assessment: Kyphotic  Communication   Communication: No difficulties  Cognition Arousal/Alertness: Awake/alert Behavior During Therapy: WFL for tasks assessed/performed Overall Cognitive Status: No family/caregiver present to determine baseline cognitive functioning                                          General Comments      Exercises     Assessment/Plan    PT Assessment Patient needs continued PT services  PT Problem List Decreased strength;Decreased activity tolerance;Decreased balance;Decreased mobility;Decreased safety awareness;Decreased knowledge of precautions       PT Treatment Interventions DME instruction;Gait training;Functional mobility training;Therapeutic activities;Therapeutic exercise;Balance training;Patient/family education    PT Goals (Current goals can be found in the Care Plan section)  Acute Rehab PT Goals Patient Stated Goal: to go home PT Goal Formulation: With patient Time For Goal Achievement: 01/23/22 Potential to Achieve Goals: Good    Frequency Min 3X/week     Co-evaluation               AM-PAC PT "6 Clicks" Mobility  Outcome Measure Help needed turning from your back to your side while in a flat bed without using bedrails?: A Little Help needed moving from lying on your back to sitting on the side of a flat bed without using bedrails?: A Little Help needed moving to and from a bed to a chair (including a wheelchair)?: A Little Help needed standing up from a chair using your arms (e.g., wheelchair or bedside chair)?: A Little Help  needed to walk in hospital room?: A Little Help needed climbing 3-5 steps with a railing? : A Lot 6 Click Score: 17    End of Session Equipment Utilized During Treatment: Gait belt Activity Tolerance: Patient tolerated treatment well Patient left: in chair;with call bell/phone within reach;with nursing/sitter in room Nurse Communication: Mobility status PT Visit Diagnosis: Unsteadiness on feet (R26.81);Muscle weakness (generalized) (M62.81)    Time: 0932-3557 PT Time Calculation (min) (ACUTE ONLY): 17 min   Charges:   PT Evaluation $PT Eval Moderate Complexity: 1 Mod          Reuel Derby, PT, DPT  Acute Rehabilitation Services  Office: [phone]   Rudean Hitt 01/09/2022, 10:43 AM

## 2022-01-10 ENCOUNTER — Ambulatory Visit: Payer: Medicare HMO | Admitting: Podiatry

## 2022-01-10 ENCOUNTER — Other Ambulatory Visit (HOSPITAL_COMMUNITY): Payer: Self-pay

## 2022-01-10 DIAGNOSIS — F411 Generalized anxiety disorder: Secondary | ICD-10-CM

## 2022-01-10 DIAGNOSIS — E0810 Diabetes mellitus due to underlying condition with ketoacidosis without coma: Secondary | ICD-10-CM | POA: Diagnosis not present

## 2022-01-10 DIAGNOSIS — E1021 Type 1 diabetes mellitus with diabetic nephropathy: Secondary | ICD-10-CM

## 2022-01-10 DIAGNOSIS — Z951 Presence of aortocoronary bypass graft: Secondary | ICD-10-CM

## 2022-01-10 LAB — CBC
HCT: 33.5 % — ABNORMAL LOW (ref 36.0–46.0)
Hemoglobin: 10.5 g/dL — ABNORMAL LOW (ref 12.0–15.0)
MCH: 25.7 pg — ABNORMAL LOW (ref 26.0–34.0)
MCHC: 31.3 g/dL (ref 30.0–36.0)
MCV: 81.9 fL (ref 80.0–100.0)
Platelets: 236 10*3/uL (ref 150–400)
RBC: 4.09 MIL/uL (ref 3.87–5.11)
RDW: 14.4 % (ref 11.5–15.5)
WBC: 5.9 10*3/uL (ref 4.0–10.5)
nRBC: 0 % (ref 0.0–0.2)

## 2022-01-10 LAB — BASIC METABOLIC PANEL
Anion gap: 9 (ref 5–15)
BUN: 18 mg/dL (ref 6–20)
CO2: 25 mmol/L (ref 22–32)
Calcium: 8.9 mg/dL (ref 8.9–10.3)
Chloride: 106 mmol/L (ref 98–111)
Creatinine, Ser: 1.25 mg/dL — ABNORMAL HIGH (ref 0.44–1.00)
GFR, Estimated: 51 mL/min — ABNORMAL LOW (ref >60)
Glucose, Bld: 190 mg/dL — ABNORMAL HIGH (ref 70–99)
Potassium: 4 mmol/L (ref 3.5–5.1)
Sodium: 140 mmol/L (ref 135–145)

## 2022-01-10 LAB — GLUCOSE, CAPILLARY
Glucose-Capillary: 253 mg/dL — ABNORMAL HIGH (ref 70–99)
Glucose-Capillary: 170 mg/dL — ABNORMAL HIGH (ref 70–99)

## 2022-01-10 MED ORDER — INSULIN GLARGINE-YFGN 100 UNIT/ML ~~LOC~~ SOLN
20.0000 [IU] | Freq: Every day | SUBCUTANEOUS | Status: DC
Start: 2022-01-10 — End: 2022-01-10
  Filled 2022-01-10: qty 0.2

## 2022-01-10 MED ORDER — PREGABALIN 75 MG PO CAPS: 75.0000 mg | ORAL_CAPSULE | Freq: Two times a day (BID) | ORAL | 5 refills | Status: DC

## 2022-01-10 MED ORDER — INSULIN ASPART 100 UNIT/ML IJ SOLN: 7.0000 [IU] | Freq: Three times a day (TID) | INTRAMUSCULAR | 5 refills | Status: DC

## 2022-01-10 MED ORDER — INSULIN DEGLUDEC 100 UNIT/ML ~~LOC~~ SOLN: 20.0000 [IU] | Freq: Every day | SUBCUTANEOUS | 5 refills | Status: DC

## 2022-01-10 MED ORDER — FERROUS SULFATE 325 (65 FE) MG PO TABS: 325.0000 mg | ORAL_TABLET | Freq: Every day | ORAL | 2 refills | Status: DC

## 2022-01-10 MED ORDER — INSULIN GLARGINE 100 UNIT/ML SOLOSTAR PEN
16.0000 [IU] | PEN_INJECTOR | Freq: Two times a day (BID) | SUBCUTANEOUS | 1 refills | Status: DC
  Filled 2022-01-10: qty 15, 47d supply, fill #0

## 2022-01-10 MED ORDER — INSULIN SYRINGES (DISPOSABLE) U-100 0.3 ML MISC: 5 refills | Status: DC

## 2022-01-10 MED ORDER — INSULIN GLARGINE-YFGN 100 UNIT/ML ~~LOC~~ SOLN
16.0000 [IU] | Freq: Every day | SUBCUTANEOUS | Status: DC
Start: 2022-01-10 — End: 2022-01-10

## 2022-01-10 MED ORDER — BUSPIRONE HCL 10 MG PO TABS: 10.0000 mg | ORAL_TABLET | Freq: Three times a day (TID) | ORAL | 2 refills | Status: DC

## 2022-01-10 MED ORDER — LOSARTAN POTASSIUM 25 MG PO TABS
25.0000 mg | ORAL_TABLET | Freq: Every day | ORAL | Status: DC
  Administered 2022-01-10: 25 mg via ORAL
  Filled 2022-01-10: qty 1

## 2022-01-10 MED ORDER — INSULIN ASPART 100 UNIT/ML FLEXPEN
7.0000 [IU] | PEN_INJECTOR | Freq: Three times a day (TID) | SUBCUTANEOUS | 11 refills | Status: DC
  Filled 2022-01-10: qty 9, 43d supply, fill #0

## 2022-01-10 MED ORDER — INSULIN GLARGINE-YFGN 100 UNIT/ML ~~LOC~~ SOLN
16.0000 [IU] | Freq: Two times a day (BID) | SUBCUTANEOUS | Status: DC
  Administered 2022-01-10: 16 [IU] via SUBCUTANEOUS
  Filled 2022-01-10 (×2): qty 0.16

## 2022-01-10 MED ORDER — BUSPIRONE HCL 10 MG PO TABS
10.0000 mg | ORAL_TABLET | Freq: Three times a day (TID) | ORAL | 2 refills | Status: DC
  Filled 2022-01-10: qty 90, 30d supply, fill #0

## 2022-01-10 MED ORDER — FERROUS SULFATE 325 (65 FE) MG PO TABS
325.0000 mg | ORAL_TABLET | Freq: Every day | ORAL | 2 refills | Status: DC
  Filled 2022-01-10: qty 30, 30d supply, fill #0

## 2022-01-10 MED ORDER — PREGABALIN 75 MG PO CAPS
75.0000 mg | ORAL_CAPSULE | Freq: Two times a day (BID) | ORAL | 5 refills | Status: DC
  Filled 2022-01-10: qty 60, 30d supply, fill #0

## 2022-01-10 NOTE — Inpatient Diabetes Management (Signed)
Inpatient Diabetes Program Recommendations  AACE/ADA: New Consensus Statement on Inpatient Glycemic Control (2015)  Target Ranges:  Prepandial:   less than 140 mg/dL      Peak postprandial:   less than 180 mg/dL (1-2 hours)      Critically ill patients:  140 - 180 mg/dL   Lab Results  Component Value Date   GLUCAP 170 (H) 01/10/2022   HGBA1C 8.8 (A) 01/02/2022    Review of Glycemic Control  Diabetes history: Type 1 DM Outpatient Diabetes medications: Omnipod- 0000-0.75, 0900-0.9, 1200-1.2, 1600-0.85= 21.05; ICR 1, ISF 120  Current orders for Inpatient glycemic control: Semglee 20 units QD, Novolog 0-15 units TID, Novolog 5 units TID   Inpatient Diabetes Program Recommendations:    At discharge: vials -Tresiba 20 units QD -Novolog 7 units TID - syringes  Spoke with patient regarding plan for outpatient transition to SQ insulin. Feels comfortable with self injection- uses vial and syringe.  Patient cannot apply Omnipod due to un affordability at this time while being the doughnut hole. Called [facility] to verify cost compared to [place] and ran benefit checks for preferred insulin.  Noted discharge summary- Levemir 16 units BID, Novolog 7 units TID. Concerned for hypoglycemia given home TDD of 21 units. See discharge recommendations above.  Discussed with MD teams. Orders for discharge adjusted and scripts sent to [facility] ($35 each). Patient informed of updated doses and is in agreement.   Thanks, Bronson Curb, MSN, RNC-OB Diabetes Coordinator [phone] (8a-5p)

## 2022-01-10 NOTE — TOC Transition Note (Signed)
Transition of Care [facility]) - CM/SW Discharge Note   Patient Details  Name: [name] MRN: [id number] Date of Birth: February 23, 1966  Transition [facility]) CM/SW Contact:  Angelita Ingles, RN Phone Number:[phone]   01/10/2022, 12:22 PM   Clinical Narrative:    Patient has order for rolling walker. Patient has declined rolling walker states that she has one at home. CM inquired if patient has made a decision on  if she wants home health. Patient states that she doesn't want home health. No other needs noted at this time. TOC will sign off.      Barriers to Discharge: Continued Medical Work up   Patient Goals and CMS Choice        Discharge Placement                       Discharge Plan and Services                                     Social Determinants of Health (SDOH) Interventions     Readmission Risk Interventions    01/09/2022    3:29 PM  Readmission Risk Prevention Plan  Transportation Screening Complete  PCP or Specialist Appt within 5-7 Days Complete  Home Care Screening Complete  Medication Review (RN CM) Referral to Pharmacy

## 2022-01-10 NOTE — Progress Notes (Signed)
Physical Therapy Treatment Patient Details Name: [name] MRN: [id number] DOB: [birth year] Today's Date: 01/10/2022   History of Present Illness Pt is a 56 y/o female admitted secondary to DKA. PMH includes Type1 DM, HTN, CAD s/p CABG, and CKD.    PT Comments    Pt received supine with HOB elevated and agreeable to session with encouragement with slow but steady progress towards to acute goals. Pt demonstrating increased ambulation endurance with min guard down to supervision with increased distance, however pt continues to fatigue quickly and is limited by pain. Pt requesting back to bed at end of session. Continued encouragement of HHPT at d/c with discussion of benefits for mobility and safety as well as collaboration with goal making, pt continues to be unsure. Pt continues to benefit from skilled PT services to progress toward functional mobility goals.    Recommendations for follow up therapy are one component of a multi-disciplinary discharge planning process, led by the attending physician.  Recommendations may be updated based on patient status, additional functional criteria and insurance authorization.  Follow Up Recommendations  Home health PT [facility] for med management-Pt will likely refuse)     Assistance Recommended at Discharge Intermittent Supervision/Assistance  Patient can return home with the following Direct supervision/assist for medications management;Assist for transportation;Assistance with cooking/housework   Equipment Recommendations  None recommended by PT    Recommendations for Other Services       Precautions / Restrictions Precautions Precautions: Fall Precaution Comments: R foot drop Restrictions Weight Bearing Restrictions: No     Mobility  Bed Mobility Overal bed mobility: Needs Assistance Bed Mobility: Supine to Sit, Sit to Supine     Supine to sit: Min assist Sit to supine: Min guard   General bed mobility comments: min assist to  elevate trunk, min guard to return to supine for safety, increased time and heavy use of bedrails throughout    Transfers Overall transfer level: Needs assistance Equipment used: Rolling walker (2 wheels) Transfers: Sit to/from Stand Sit to Stand: Min guard           General transfer comment: Min guard for safety, vc for safe hand placement,    Ambulation/Gait Ambulation/Gait assistance: Min guard, Supervision Gait Distance (Feet): 60 Feet Assistive device: Rolling walker (2 wheels) Gait Pattern/deviations: Step-through pattern, Decreased stride length, Decreased weight shift to right Gait velocity: Decreased     General Gait Details: Slow, mildly antalgic gait.  Min guard to supervision for safety. distance limited to pt stated [place] secon[place] to fatigue   Stairs             Wheelchair Mobility    Modified Rankin (Stroke Patients Only)       Balance Overall balance assessment: Needs assistance Sitting-balance support: No upper extremity supported, Feet supported Sitting balance-Leahy Scale: Good     Standing balance support: Bilateral upper extremity supported Standing balance-Leahy Scale: Poor Standing balance comment: Reliant on BUE support                            Cognition Arousal/Alertness: Awake/alert Behavior During Therapy: WFL for tasks assessed/performed Overall Cognitive Status: No family/caregiver present to determine baseline cognitive functioning Area of Impairment: Safety/judgement, Awareness, Problem solving                         Safety/Judgement: Decreased awareness of safety, Decreased awareness of deficits   Problem Solving: Slow processing, Difficulty sequencing, Requires  verbal cues          Exercises      General Comments General comments (skin integrity, edema, etc.): VSS on RA      Pertinent Vitals/Pain Pain Assessment Pain Assessment: Faces Faces Pain Scale: Hurts a little bit Pain  Location: B LE Pain Descriptors / Indicators: Grimacing, Guarding, Sore Pain Intervention(s): Monitored during session, Limited activity within patient's tolerance    Home Living                          Prior Function            PT Goals (current goals can now be found in the care plan section) Acute Rehab PT Goals Patient Stated Goal: to go home PT Goal Formulation: With patient Time For Goal Achievement: 01/23/22    Frequency    Min 3X/week      PT Plan      Co-evaluation              AM-PAC PT "6 Clicks" Mobility   Outcome Measure  Help needed turning from your back to your side while in a flat bed without using bedrails?: A Little Help needed moving from lying on your back to sitting on the side of a flat bed without using bedrails?: A Little Help needed moving to and from a bed to a chair (including a wheelchair)?: A Little Help needed standing up from a chair using your arms (e.g., wheelchair or bedside chair)?: A Little Help needed to walk in hospital room?: A Little Help needed climbing 3-5 steps with a railing? : A Lot 6 Click Score: 17    End of Session   Activity Tolerance: Patient tolerated treatment well Patient left: with call bell/phone within reach;in bed;with bed alarm set Nurse Communication: Mobility status PT Visit Diagnosis: Unsteadiness on feet (R26.81);Muscle weakness (generalized) (M62.81)     Time: 4401-0272 PT Time Calculation (min) (ACUTE ONLY): 20 min  Charges:  $Therapeutic Activity: 8-22 mins                     Shiquan Mathieu R. PTA Acute Rehabilitation Services Office: [place] 01/10/2022, 1:53 PM

## 2022-01-10 NOTE — Consult Note (Signed)
   [facility] CM Inpatient Consult   01/10/2022  Vianca Bracher 05/14/1966 [id number]  [facility] [ACO] Patient: [employer]   Primary Care Provider:  [name], MD, with [facility]   Patient screened for hospitalization  to assess for potential [place] Management service needs for post hospital transition.  Patient had transitioned home on rounds. Nurse states patient literally discharge but could use post hospital follow up.   Plan:  Review recommendations for post hospital care management needs for diabetes management follow up.  Request referral follow up.  For questions contact:   Natividad Brood, RN BSN [place] [facility] Liaison  [phone] business mobile phone Toll free office [phone]  Fax number: [phone] [email or web address]

## 2022-01-10 NOTE — Discharge Summary (Addendum)
Name: [name] MRN: [id number] DOB: [birth year] 56 y.o. PCP: [name], MD  Date of Admission: 01/06/2022  5:47 PM Date of Discharge:   01/10/2022 Attending Physician: Dr. Evette Doffing  Discharge Diagnosis: Principal Problem:   DKA (diabetic ketoacidosis) [facility])    Discharge Medications: Allergies as of 01/10/2022       Reactions   Anesthetics, Amide Nausea And Vomiting   Chlorhexidine Other (See Comments)   Unknown reaction   Penicillins Diarrhea, Nausea And Vomiting, Other (See Comments)   Enkaid [encainide] Nausea And Vomiting   Subutex [buprenorphine] Rash        Medication List     STOP taking these medications    insulin glargine 100 UNIT/ML injection Commonly known as: LANTUS       TAKE these medications    acetaminophen 500 MG tablet Commonly known as: TYLENOL Take 1,000 mg by mouth at bedtime as needed for mild pain.   aspirin EC 81 MG tablet Take 1 tablet (81 mg total) by mouth daily.   busPIRone 10 MG tablet Commonly known as: BUSPAR Take 1 tablet (10 mg total) by mouth 3 (three) times daily.   ferrous sulfate 325 (65 FE) MG tablet Take 1 tablet (325 mg total) by mouth daily with breakfast. Start taking on: January 11, 2022   furosemide 40 MG tablet Commonly known as: LASIX Take 1 tablet (40 mg total) by mouth daily. What changed:  when to take this reasons to take this   insulin aspart 100 UNIT/ML injection Commonly known as: novoLOG Inject 100 Units into pump What changed:  how much to take how to take this when to take this additional instructions   insulin aspart 100 UNIT/ML injection Commonly known as: NovoLOG Inject 7 Units into the skin 3 (three) times daily before meals. What changed: You were already taking a medication with the same name, and this prescription was added. Make sure you understand how and when to take each.   Insulin Degludec 100 UNIT/ML Soln Commonly known as: [place] Inject 20 Units into the skin  daily.   losartan 25 MG tablet Commonly known as: COZAAR Take 1 tablet (25 mg total) by mouth daily. Hold this medication if your systolic blood pressure ( top number) s less than 100.  Please call your doctor is your systolic blood pressure is less than 100   metoCLOPramide 10 MG tablet Commonly known as: REGLAN TAKE 1 TABLET(10 MG) BY MOUTH THREE TIMES DAILY BEFORE MEALS What changed:  how much to take how to take this when to take this reasons to take this additional instructions   metoprolol tartrate 25 MG tablet Commonly known as: LOPRESSOR Take 0.5 tablets (12.5 mg total) by mouth 2 (two) times daily. Please check your blood pressure twice a day, please hold this medication if your systolic blood pressure ( top number) is less than 100, please call your doctor if your systolic blood pressure is less than 100. What changed: additional instructions   ondansetron 4 MG tablet Commonly known as: Zofran Take 1 tablet (4 mg total) by mouth every 8 (eight) hours as needed for nausea or vomiting.   pregabalin 75 MG capsule Commonly known as: LYRICA Take 1 capsule (75 mg total) by mouth 2 (two) times daily. What changed:  medication strength how much to take how to take this when to take this additional instructions   rosuvastatin 10 MG tablet Commonly known as: CRESTOR Take 1 tablet (10 mg total) by mouth daily.  Durable Medical Equipment  (From admission, onward)           Start     Ordered   01/10/22 0656  For home use only DME Walker rolling  Once       Question Answer Comment  Walker: With [place] Wheels   Patient needs a walker to treat with the following condition Weakness      01/10/22 0656            Disposition and follow-up:   Ms.Alexine Ege was discharged from [facility] in Good condition.  At the hospital follow up visit please address:  1.  Follow-up:  a.  Please titrate her insulin regimen as  necessary. Of note, patient was previously using insulin pump, but was unable to refill her pump due to an insurance gap. We discharged her with on insulin vials coverage in the interim until she is able to get her pump back. Her discharge regimen is [place] 20 units daily and NovoLog 7 units with meals.    b.  Ms. Kaster was euvolemic on discharge. We restarted her home metoprolol at home losartan prior to discharge, but her Lasix was held. Of note, she has a as needed prescription for Lasix at home. Please assess her volume status and titrate medications as necessary for her HFpEF and hypertension.   c.  We started Ms. Marken on iron supplements for iron deficiency anemia. Please assess whether she is taking the supplements.  2.  Labs / imaging needed at time of follow-up: BMP, CBG  3.  Pending labs/ test needing follow-up: None  Follow-up Appointments:  Follow-up Information     [name], MD. Schedule an appointment as soon as possible for a visit in 2 week(s).   Specialty: Family Medicine Why: Please call your PCP and make an appointment within the 1 to 2 weeks for hospital follow-up. Contact information: [place] [address] [phone]         Nahser, Wonda Cheng, MD .   Specialty: Cardiology Contact information: [address] [place] [address] [phone]                 Hospital Course by problem list: Emmersyn Kratzke is a 56 y.o. with a pertinent PMH of type 1 diabetes, who presented with abdominal pain, nausea and vomiting and was admitted for DKA.  DKA resolved by hospital day 2 and she was quickly transitioned to subcutaneous insulin and was deemed to be stable for discharge on hospital day 4.  #DKA (resolved) #Uncontrolled T1DM #Diabetic neuropathy #Gastroparesis Patient with a history of type 1 diabetes and DKA presented with abdominal pain, N/V.  She was in acute distress and was found to have  generalized abdominal tenderness, tachypnea with Kussmaul respirations.  Initial vitals showed tachycardia and tachypnea.  Initial work-up revealed glucose of 733, creatinine 2.34, potassium 5.5, bicarb of 10 with anion gap of 29, WBC 17.3, VBG pH 7.14, BHB>8.  EKG revealed sinus tachycardia.  She was started on insulin drip and IV fluids in the ED and admitted for DKA management.  Of note, the precipitating factor for her DKA is likely lack of access to insulin pump, as the patient is currently in an insurance gap.  A1c on presentation was 8.8.  On hospital day 1, patient's anion gap was closed and she was nonacidotic with CBGs in the 100s to 130s on insulin drip.  The patient was transitioned  to a subcutaneous regimen of insulin on the night of hospital day 1 after she was confirmed to be clinically stable (see tachypnea below).  Her CBGs were eventually adequately maintained on subcutaneous regimen and we will be discharging her on Tresiba 20 units daily and NovoLog pen 7 units with meals.  Outpatient insulin titration will be necessary and she will also need to look into regaining access to her insulin pump.  Of note, we restarted her home Reglan when she was transitioned off the insulin drip.  Additionally, we modified her home dose of Lyrica to 75 mg twice daily due to her CKD 3.  #Tachypnea #Anxiety Patient was persistently tachycardic and tachypneic on the morning of hospital day 1. She was nonacidotic with a closed gap at this point so her tachypnea was not consistent with Kussmaul respirations which were previously noted in the ED.  Patient remained afebrile and was saturating above 94% on room air.  We performed additional work-up as follows: RVP was negative, EKG showing no new changes, chest x-ray at admission unrevealing of any acute process.  Of note, the patient has a history of panic attacks and was endorsing significant anxiety.  We started her home BuSpar 10 mg 3 times daily and her tachypnea  resolved later in the day without returning on subsequent hospital days so we believe it was likely due to anxiety or panic attacks.  We will continue her on BuSpar 10 mg 3 times daily on discharge with possible outpatient titration.   #AKI on CKD 3 (resolved) Baseline creatinine around 1.4-1.5. Creatinine elevated to 2.34 on admission in the setting of dehydration from DKA and osmotic diuresis, but quickly normalized with aggressive fluid resuscitation, with creatinine on the day of discharge at 1.25.  #HTN #HFpEF #CAD s/p CABG  Last echo 04/2021 showed EF 55-60%.  Patient remained hemodynamically stable after resolution of her DKA.  We restarted her on her home metoprolol and later losartan and continued her home statin and aspirin.  She did not require Lasix diuresis during her stay here.   #QTc prolongation Patient with a history of QT prolongation in the setting of daily use of metoclopramide use for gastroparesis.  Multiple repeated EKGs show QTc in the normal range and her Reglan was restarted when she resumed her diet.   #IDA Has a history of iron deficiency anemia but no recent iron studies on file.  Hemoglobin 11.7 on admission.  Iron studies concerning for significant iron deficiency anemia.  We started her on p.o. iron supplementation on discharge.  Discharge subjective: Patient denies any symptoms today, including abdominal pain, nausea or vomiting, confusion.  She feels she is doing better and ready for discharge.  We discussed discharging her with insulin pens until she can get her pump and she is amenable to this plan.  She was able to get the DME necessary to guarantee safe discharge.  Of note, she declined home health nursing/PT/OT because she believed she has not benefited from the services in the past.  She has family nearby who can help her upon return to home.  Discharge Vitals:   BP (!) 136/55   Pulse 71   Temp 98.7 F (37.1 C) (Oral)   Resp 15   Ht '5\' 1"'$  (1.549 m)    Wt 80.2 kg   SpO2 96%   BMI 33.41 kg/m   Discharge exam: General: Well-appearing middle-aged woman laying in bed.  Pleasant and intereactive.   CV: RRR. No m/r/g Pulmonary: Regular work  of breathing.  Lungs clear to auscultation bilaterally Abdominal: Soft.  Nondistended.  Nontender  Extremities: Warm and well-perfused.  No lower extremity edema noted. Psych: Anxiety is improved from prior exams  Pertinent Labs, Studies, and Procedures:     Latest Ref Rng & Units 01/10/2022    1:03 AM 01/09/2022    7:13 AM 01/08/2022   12:18 PM  CBC  WBC 4.0 - 10.5 K/uL 5.9  6.3  9.5   Hemoglobin 12.0 - 15.0 g/dL 10.5  11.2  10.3   Hematocrit 36.0 - 46.0 % 33.5  35.8  32.9   Platelets 150 - 400 K/uL 236  255  249        Latest Ref Rng & Units 01/10/2022    1:03 AM 01/09/2022    7:13 AM 01/08/2022   12:18 PM  CMP  Glucose 70 - 99 mg/dL 190  130  200   BUN 6 - 20 mg/dL '18  15  23   '$ Creatinine 0.44 - 1.00 mg/dL 1.25  1.16  1.39   Sodium 135 - 145 mmol/L 140  142  139   Potassium 3.5 - 5.1 mmol/L 4.0  4.1  4.7   Chloride 98 - 111 mmol/L 106  106  109   CO2 22 - 32 mmol/L '25  24  20   '$ Calcium 8.9 - 10.3 mg/dL 8.9  8.9  9.1    Initial work-up:glucose of 733, creatinine 2.34, potassium 5.5, bicarb of 10 with anion gap of 29, WBC 17.3, VBG pH 7.14, BHB>8  Iron studies: Ferritin 15, iron 22, TIBC 461, saturation ratio 5  DG Chest Portable 1 View  Result Date: 01/06/2022 CLINICAL DATA:  Altered level of consciousness, hyperglycemia EXAM: PORTABLE CHEST 1 VIEW COMPARISON:  10/03/2021 FINDINGS: Single frontal view of the chest demonstrates stable postsurgical changes from CABG. Cardiac silhouette is unremarkable. No acute airspace disease, effusion, or pneumothorax. IMPRESSION: 1. No acute intrathoracic process. Electronically Signed   By: Randa Ngo M.D.   On: 01/06/2022 19:24     Discharge Instructions: Discharge Instructions     Call MD for:  difficulty breathing, headache or visual  disturbances   Complete by: As directed    Call MD for:  persistant dizziness or light-headedness   Complete by: As directed    Call MD for:  persistant nausea and vomiting   Complete by: As directed    Call MD for:  severe uncontrolled pain   Complete by: As directed    Call MD for:  temperature >100.4   Complete by: As directed    Diet - low sodium heart healthy   Complete by: As directed    Discharge instructions   Complete by: As directed    Please call doctor or come back to the ED if you are experiencing severe nausea or vomiting, abdominal pain, worsening confusion, fever, trouble breathing, or episodes of fainting.  1.  We discharged you with Tresiba 20 units daily and NovoLog 7 units three times daily with meals as discussed.  Please follow-up with your endocrinologist or primary doctor about getting back on the pump later on. 2.  Please make a hospital follow-up appointment with your PCP in the next 1 to 2 weeks 3.  We diagnosed you with iron deficiency anemia during this hospital stay.  Please take your iron supplements as prescribed.  You were admitted for DKA, which was quickly resolved with an insulin drip.  Please follow-up with your doctors about getting set up with  an insulin regimen.   Increase activity slowly   Complete by: As directed        Signed: Lacinda Axon, MD 01/10/2022, 1:30 PM   Pager: [phone]

## 2022-01-10 NOTE — Plan of Care (Signed)

## 2022-01-11 ENCOUNTER — Other Ambulatory Visit: Payer: Self-pay

## 2022-01-11 NOTE — Patient Outreach (Signed)
[place] [facility]) Care Management  01/11/2022  [name] [birth year] [id number]   Referral Date: 01/11/22 Referral Source: Hospital Liaison Date of Admission:01/06/22  Diagnosis: DKA Date of Discharge:01/10/22 Facility: [place]: [employer]  Outreach attempt: No answer.  HIPAA compliant voice message left.  Plan: RN CM will place on assigned CM schedule within 4 business days.  Will send letter   [name], RN, MSN [place] Management Care Management Coordinator Direct Line [phone] Toll Free: [phone]  Fax: [phone]

## 2022-01-12 ENCOUNTER — Ambulatory Visit: Payer: Medicare HMO

## 2022-01-15 ENCOUNTER — Other Ambulatory Visit: Payer: Self-pay

## 2022-01-15 NOTE — Patient Outreach (Signed)
[place] [facility]) Care Management  01/15/2022  [name] March 28, 1966 [id number]   Referral Date: 01/11/22 Referral Source: Hospital Liaison Date of Admission:01/06/22  Diagnosis: DKA Date of Discharge:01/10/22 Facility: [place]: [employer]   Outreach attempt #2 to patient. Spoke with patient who reported she did not feel like talking and requested call back another day.     Plan: RN CM will make outreach attempt to patient tomorrow.   Enzo Montgomery, RN,BSN,CCM [place] Management Telephonic Care Management Coordinator Direct Phone: [phone] Toll Free: [phone] Fax: [phone]

## 2022-01-16 ENCOUNTER — Other Ambulatory Visit: Payer: Self-pay

## 2022-01-16 NOTE — Patient Outreach (Signed)
[place] [facility]) Care Management  01/16/2022  Ardena Gangl 01/29/1966 [id number]   Referral Date: 01/11/22 Referral Source: Hospital Liaison Date of Admission:01/06/22  Diagnosis: DKA Date of Discharge:01/10/22 Facility: [place]: [employer]   Unsuccessful outreach attempt # 3 to patient.    Plan: RN CM will make outreach attempt to patient within 3-4 wks if no response from letter mailed to patient.  Enzo Montgomery, RN,BSN,CCM [place] Management Telephonic Care Management Coordinator Direct Phone: [phone] Toll Free: [phone] Fax: [phone]

## 2022-02-06 ENCOUNTER — Other Ambulatory Visit: Payer: Self-pay

## 2022-02-06 NOTE — Patient Outreach (Signed)
[place] [facility]) Care Management  02/06/2022  [name] 06/20/1966 [id number]   Referral Date: 01/11/22 Referral Source: Hospital Liaison Date of Admission:01/06/22  Diagnosis: DKA Date of Discharge:01/10/22 Facility: [place]: [employer]   Multiple attempts to establish contact with patient without success. No response from letter mailed to patient. Case is being closed at this time.     Enzo Montgomery, RN,BSN,CCM [place] Management Telephonic Care Management Coordinator Direct Phone: [phone] Toll Free: [phone] Fax: [phone]

## 2022-02-07 ENCOUNTER — Encounter: Payer: Self-pay | Admitting: Podiatry

## 2022-02-07 ENCOUNTER — Ambulatory Visit: Payer: Medicare HMO | Admitting: Podiatry

## 2022-02-07 DIAGNOSIS — L97421 Non-pressure chronic ulcer of left heel and midfoot limited to breakdown of skin: Secondary | ICD-10-CM

## 2022-02-07 DIAGNOSIS — E1142 Type 2 diabetes mellitus with diabetic polyneuropathy: Secondary | ICD-10-CM | POA: Diagnosis not present

## 2022-02-07 DIAGNOSIS — L97411 Non-pressure chronic ulcer of right heel and midfoot limited to breakdown of skin: Secondary | ICD-10-CM

## 2022-02-07 MED ORDER — SILVER SULFADIAZINE 1 % EX CREA: 1.0000 | TOPICAL_CREAM | Freq: Every day | CUTANEOUS | 0 refills | Status: DC

## 2022-02-07 NOTE — Progress Notes (Signed)
Subjective:  Patient ID: [name], female    DOB: 12-15-1965,   MRN: [id number]  Chief Complaint  Patient presents with   Foot Ulcer    Bilateral heel ulcers , right foot is worst than left. Patient is a diabetic. Some redness not swelling. Patient states she got these ulcers from being in the hospital 1 month ago     56 y.o. female presents for concern of bilateral heel ulcers on both feet. Relates she initially developed them after being in the hospital.  She was seen by Dr. Elisha Ponder on 5/30 and advised to follow-up with me in the month to check on the heels. Relates while in hospital they did not offload the heels and she developed wound. She has been using Cerave cream on them.  . Denies any other pedal complaints. Denies n/v/f/c.   Past Medical History:  Diagnosis Date   AKI (acute kidney injury) ([place])    Anemia, iron deficiency    Anxiety and depression 05/18/2015   CAD (coronary artery disease), native coronary artery 01/11/2018   s/p CABG - Non-STEMI with severe three-vessel disease noted July 2019   Depression    Diabetic gastroparesis ([place])    Diabetic neuropathy, type I diabetes mellitus ([place]) 05/18/2015   DKA, type 1 ([place]) 03/18/2016   Essential hypertension    Gastroparesis    GERD (gastroesophageal reflux disease)    HLD (hyperlipidemia)    MDD (major depressive disorder), single episode, severe , no psychosis ([place])    Tardive dyskinesia    Vitamin B12 deficiency 08/16/2015    Objective:  Physical Exam: Vascular: DP/PT pulses 2/4 bilateral. CFT <3 seconds. Normal hair growth on digits. No edema.  Skin. No lacerations or abrasions bilateral feet. Bilateral heel ulcers noted to posterior heel. On right measures about 3 cm x 2 cm x 0.1 cm and on left about 1 cm x 0.5 cm x 0.1 cm with granular base. No erythema edema or purulence noted.   Musculoskeletal: MMT 5/5 bilateral lower extremities in DF, PF, Inversion and Eversion. Deceased ROM in DF of ankle joint.   Neurological: Sensation intact to light touch.   Assessment:   1. Skin ulcer of left heel, limited to breakdown of skin ([place])   2. Diabetic peripheral neuropathy associated with type 2 diabetes mellitus ([place])   3. Skin ulcer of right heel, limited to breakdown of skin [facility])      Plan:  Patient was evaluated and treated and all questions answered. Ulcer bilateral heels limited to breakdown of skine  -Debridement as below. -Dressed with silvadene, DSD. -Silvadene sent to pharmacy.  -Orders for home health placed.  -Off-loading with surgical shoe. Dispensed bilateral.  Also discussed purchasing heel cushions to wear at night and when in her recliner.  -No abx indicated.  -Discussed glucose control and proper protein-rich diet.  -Discussed if any worsening redness, pain, fever or chills to call or may need to report to the emergency room. Patient expressed understanding.   Procedure: Excisional Debridement of Wound Rationale: Removal of non-viable soft tissue from the wound to promote healing.  Anesthesia: none Pre-Debridement Wound Measurements: Overlying callus  Post-Debridement Wound Measurements: 3 cm x 2 cm x 0.1 cm (right) 0.5 cm x 1 cm x 0.1 cm(left)  Type of Debridement: Sharp Excisional Tissue Removed: Non-viable soft tissue Depth of Debridement: subcutaneous tissue. Technique: Sharp excisional debridement to bleeding, viable wound base.  Dressing: Dry, sterile, compression dressing. Disposition: Patient tolerated procedure well. Patient to return in 2 week  for follow-up.  Return in about 2 weeks (around 02/21/2022) for wound check.   Lorenda Peck, DPM

## 2022-02-13 ENCOUNTER — Telehealth: Payer: Self-pay | Admitting: Podiatry

## 2022-02-13 ENCOUNTER — Ambulatory Visit: Payer: Medicare HMO | Attending: Family Medicine

## 2022-02-13 DIAGNOSIS — Z0000 Encounter for general adult medical examination without abnormal findings: Secondary | ICD-10-CM | POA: Diagnosis not present

## 2022-02-13 MED ORDER — ZOSTER VAC RECOMB ADJUVANTED 50 MCG/0.5ML IM SUSR: 0.5000 mL | Freq: Once | INTRAMUSCULAR | 1 refills | Status: AC

## 2022-02-13 NOTE — Progress Notes (Signed)
Subjective:   [name] is a 56 y.o. female who presents for an Initial Medicare Annual Wellness Visit.  Review of Systems     I connected with [name] on 02/13/2022 at 4:00 pm by telephone and verified that I am speaking with the correct person using two identifiers. I discussed the limitations, risks, security and privacy concerns of performing an evaluation and management service by telephone and the availability of in person appointments. I also discussed with the patient that there may be a patient responsible charge related to this service. The patient expressed understanding and agreed to proceed.   Patient location: Home  My Location: [place] on the telephone call: Myself and Patient       Cardiac Risk Factors include: none     Objective:    Today's Vitals   02/13/22 1600  PainSc: 5    There is no height or weight on file to calculate BMI.     02/13/2022    4:17 PM 10/03/2021    4:24 PM 11/04/2019    9:44 AM 05/10/2019    4:00 AM 05/09/2019    8:21 PM 03/02/2019    1:37 AM 02/13/2019    8:07 PM  Advanced Directives  Does Patient Have a Medical Advance Directive? No No No No No No No  Would patient like information on creating a medical advance directive? Yes (ED - Information included in AVS) No - Patient declined No - Patient declined No - Patient declined  No - Patient declined No - Patient declined    Current Medications (verified) Outpatient Encounter Medications as of 02/13/2022  Medication Sig   acetaminophen (TYLENOL) 500 MG tablet Take 1,000 mg by mouth at bedtime as needed for mild pain.   aspirin EC 81 MG tablet Take 1 tablet (81 mg total) by mouth daily.   furosemide (LASIX) 40 MG tablet Take 1 tablet (40 mg total) by mouth daily. (Patient taking differently: Take 40 mg by mouth daily as needed for edema.)   insulin aspart (NOVOLOG) 100 UNIT/ML injection Inject 100 Units into pump (Patient taking differently: Inject 125  Units into the skin continuous. 125 units via pump over 3 days)   Insulin Degludec (TRESIBA) 100 UNIT/ML SOLN Inject 20 Units into the skin daily.   Insulin Syringes, Disposable, U-100 0.3 ML MISC Use to draw up insulin as instructed   metoCLOPramide (REGLAN) 10 MG tablet TAKE 1 TABLET(10 MG) BY MOUTH THREE TIMES DAILY BEFORE MEALS (Patient taking differently: Take 10 mg by mouth 3 (three) times daily as needed for nausea or vomiting.)   metoprolol tartrate (LOPRESSOR) 25 MG tablet Take 0.5 tablets (12.5 mg total) by mouth 2 (two) times daily. Please check your blood pressure twice a day, please hold this medication if your systolic blood pressure ( top number) is less than 100, please call your doctor if your systolic blood pressure is less than 100. (Patient taking differently: Take 12.5 mg by mouth 2 (two) times daily. HOLD if SBP < 100)   ondansetron (ZOFRAN) 4 MG tablet Take 1 tablet (4 mg total) by mouth every 8 (eight) hours as needed for nausea or vomiting.   pregabalin (LYRICA) 75 MG capsule Take 1 capsule (75 mg total) by mouth 2 (two) times daily.   rosuvastatin (CRESTOR) 10 MG tablet Take 1 tablet (10 mg total) by mouth daily.   silver sulfADIAZINE (SILVADENE) 1 % cream Apply 1 Application topically daily.   busPIRone (BUSPAR) 10 MG tablet  Take 1 tablet (10 mg total) by mouth 3 (three) times daily. (Patient not taking: Reported on 02/13/2022)   ferrous sulfate 325 (65 FE) MG tablet Take 1 tablet (325 mg total) by mouth daily with breakfast. (Patient not taking: Reported on 02/13/2022)   insulin aspart (NOVOLOG) 100 UNIT/ML injection Inject 7 Units into the skin 3 (three) times daily before meals. (Patient not taking: Reported on 02/13/2022)   losartan (COZAAR) 25 MG tablet Take 1 tablet (25 mg total) by mouth daily. Hold this medication if your systolic blood pressure ( top number) s less than 100.  Please call your doctor is your systolic blood pressure is less than 100 (Patient not taking:  Reported on 02/13/2022)   No facility-administered encounter medications on file as of 02/13/2022.    Allergies (verified) Anesthetics, amide; Chlorhexidine; Penicillins; Enkaid [encainide]; and Subutex [buprenorphine]   History: Past Medical History:  Diagnosis Date   AKI (acute kidney injury) ([place])    Anemia, iron deficiency    Anxiety and depression 05/18/2015   CAD (coronary artery disease), native coronary artery 01/11/2018   s/p CABG - Non-STEMI with severe three-vessel disease noted July 2019   Depression    Diabetic gastroparesis ([facility])    Diabetic neuropathy, type I diabetes mellitus ([place]) 05/18/2015   DKA, type 1 ([place]) 03/18/2016   Essential hypertension    Gastroparesis    GERD (gastroesophageal reflux disease)    HLD (hyperlipidemia)    MDD (major depressive disorder), single episode, severe , no psychosis ([place])    Tardive dyskinesia    Vitamin B12 deficiency 08/16/2015   Past Surgical History:  Procedure Laterality Date   CARDIAC SURGERY     COLONOSCOPY  09/27/2014   at [facility] N/A 01/14/2018   Procedure: CORONARY ARTERY BYPASS GRAFTING (CABG)X3, RIGHT AND LEFT SAPHENOUS VEIN HARVEST, MAMMARY TAKE DOWN. MAMMARY TO LAD, SVG TO PD, SVG TO DISTAL CIRC.;  Surgeon: Grace Isaac, MD;  Location: [facility];  Service: Open Heart Surgery;  Laterality: N/A;   ESOPHAGOGASTRODUODENOSCOPY  09/27/2014   at [facility], Dr Rolan Lipa. biospy neg for celiac, neg for H pylori.    EYE SURGERY     gailstones     INTRAMEDULLARY (IM) NAIL INTERTROCHANTERIC Right 05/10/2019   Procedure: INTRAMEDULLARY (IM) NAIL INTERTROCHANTRIC;  Surgeon: Lovell Sheehan, MD;  Location: [facility];  Service: Orthopedics;  Laterality: Right;   IR FLUORO GUIDE CV LINE RIGHT  02/01/2017   IR FLUORO GUIDE CV LINE RIGHT  03/06/2017   IR FLUORO GUIDE CV LINE RIGHT  03/25/2017   IR GENERIC HISTORICAL  01/24/2016   IR FLUORO GUIDE CV LINE RIGHT 01/24/2016 Darrell K Allred, PA-C [facility]    IR GENERIC HISTORICAL  01/24/2016   IR US GUIDE VASC ACCESS RIGHT 01/24/2016 Darrell K Allred, PA-C [facility]   IR US GUIDE VASC ACCESS RIGHT  02/01/2017   IR US GUIDE VASC ACCESS RIGHT  03/06/2017   IR US GUIDE VASC ACCESS RIGHT  03/25/2017   IR US GUIDE VASC ACCESS RIGHT  01/20/2019   IR VENIPUNCTURE 85YRS/OLDER BY MD  01/20/2019   LEFT HEART CATH AND CORONARY ANGIOGRAPHY N/A 01/07/2018   Procedure: LEFT HEART CATH AND CORONARY ANGIOGRAPHY;  Surgeon: Troy Sine, MD;  Location: [facility];  Service: Cardiovascular;  Laterality: N/A;   POSTERIOR VITRECTOMY AND MEMBRANE PEEL-LEFT EYE  09/28/2002   POSTERIOR VITRECTOMY AND MEMBRANE PEEL-RIGHT EYE  03/16/2002   RETINAL DETACHMENT SURGERY  TEE WITHOUT CARDIOVERSION N/A 01/14/2018   Procedure: TRANSESOPHAGEAL ECHOCARDIOGRAM (TEE);  Surgeon: Grace Isaac, MD;  Location: [facility];  Service: Open Heart Surgery;  Laterality: N/A;   Family History  Problem Relation Age of Onset   Cystic fibrosis Mother    Hypertension Father    Diabetes Brother    Hypertension Maternal Grandmother    Social History   Socioeconomic History   Marital status: Single    Spouse name: Not on file   Number of children: 1   Years of education: Not on file   Highest education level: Not on file  Occupational History   Occupation: Unemployed  Tobacco Use   Smoking status: Never   Smokeless tobacco: Never  Vaping Use   Vaping Use: Never used  Substance and Sexual Activity   Alcohol use: No   Drug use: Yes    Types: Marijuana    Comment: occ   Sexual activity: Yes    Partners: Male    Birth control/protection: None  Other Topics Concern   Not on file  Social History Narrative   Not on file   Social Determinants of Health   Financial Resource Strain: Low Risk  (02/13/2022)   Overall Financial Resource Strain (CARDIA)    Difficulty of Paying Living Expenses: Not very hard  Food Insecurity: No Food Insecurity (02/13/2022)   Hunger Vital Sign     Worried About Running Out of Food in the Last Year: Never true    Ran Out of Food in the Last Year: Never true  Transportation Needs: No Transportation Needs (02/13/2022)   PRAPARE - Transportation    Lack of Transportation (Medical): No    Lack of Transportation (Non-Medical): No  Physical Activity: Inactive (02/13/2022)   Exercise Vital Sign    Days of Exercise per Week: 0 days    Minutes of Exercise per Session: 0 min  Stress: Stress Concern Present (02/13/2022)   [place]    Feeling of Stress : To some extent  Social Connections: Moderately Isolated (02/13/2022)   Social Connection and Isolation Panel [NHANES]    Frequency of Communication with Friends and Family: More than three times a week    Frequency of Social Gatherings with Friends and Family: Three times a week    Attends Religious Services: More than 4 times per year    Active Member of Clubs or Organizations: No    Attends Archivist Meetings: Never    Marital Status: Never married    Tobacco Counseling Counseling given: Not Answered   Clinical Intake:  Pre-visit preparation completed: No  Pain : 0-10 Pain Score: 5  Pain Type: Chronic pain Pain Location: Foot Pain Orientation: Left, Right Pain Descriptors / Indicators: Aching Pain Onset: 1 to 4 weeks ago Pain Frequency: Occasional     Nutritional Risks: None Diabetes: Yes CBG done?: No Did pt. bring in CBG monitor from home?: No  How often do you need to have someone help you when you read instructions, pamphlets, or other written materials from your doctor or pharmacy?: 1 - Never  Diabetic?Yes  Interpreter Needed?: No      Activities of Daily Living    02/13/2022    4:18 PM 01/10/2022    2:27 PM  In your present state of health, do you have any difficulty performing the following activities:  Hearing? 0   Vision? 0   Difficulty concentrating or making decisions? 0    Walking  or climbing stairs? 1   Dressing or bathing? 0   Doing errands, shopping? 1 0  Preparing Food and eating ? N   Using the Toilet? N   In the past six months, have you accidently leaked urine? N   Do you have problems with loss of bowel control? N   Managing your Medications? N   Managing your Finances? N   Housekeeping or managing your Housekeeping? N     Patient Care Team: [name], MD as PCP - General (Family Medicine) Nahser, Wonda Cheng, MD as PCP - Cardiology (Cardiology)  Indicate any recent Medical Services you may have received from other than [facility] providers in the past year (date may be approximate).     Assessment:   This is a routine wellness examination for [name].  Hearing/Vision screen No results found.  Dietary issues and exercise activities discussed: Current Exercise Habits: The patient does not participate in regular exercise at present, Exercise limited by: None identified   Goals Addressed   None    Depression Screen    02/13/2022    4:18 PM 09/06/2021   10:04 AM 05/06/2020    2:26 PM 02/15/2020   10:28 AM 11/03/2019   11:55 AM 08/05/2019    9:35 AM 07/16/2018   11:08 AM  PHQ 2/9 Scores  PHQ - 2 Score 0 0 0 '2 2 2 2  '$ PHQ- 9 Score  '2  10 7 4 11    '$ Fall Risk    02/13/2022    4:17 PM 09/06/2021    9:59 AM 05/06/2020    2:26 PM 08/05/2019    9:35 AM 07/16/2018   11:02 AM  Fall Risk   Falls in the past year? 0 0 1 1 0  Number falls in past yr: 0 0 0 0   Injury with Fall? 0 0 1 1   Risk for fall due to : History of fall(s)      Follow up Education provided        [place]:  Any stairs in or around the home? No  If so, are there any without handrails? No  Home free of loose throw rugs in walkways, pet beds, electrical cords, etc? Yes  Adequate lighting in your home to reduce risk of falls? Yes   ASSISTIVE DEVICES UTILIZED TO PREVENT FALLS:  Life alert? No  Use of a cane, walker or w/c? Yes  Grab  bars in the bathroom? Yes  Shower chair or bench in shower? Yes  Elevated toilet seat or a handicapped toilet? Yes   TIMED UP AND GO:  Was the test performed? No .  Length of time to ambulate 10 feet: 0  sec.   Gait slow and steady with assistive device  Cognitive Function:    02/13/2022    4:22 PM  MMSE - Mini Mental State Exam  Not completed: Unable to complete        02/13/2022    4:22 PM  6CIT Screen  What Year? 0 points  What month? 0 points  What time? 0 points  Count back from 20 0 points  Months in reverse 0 points  Repeat phrase 0 points  Total Score 0 points    Immunizations Immunization History  Administered Date(s) Administered   Influenza Split 03/25/2012   Influenza, Seasonal, Injecte, Preservative Fre 04/08/2014   Influenza,inj,Quad PF,6+ Mos 04/09/2015, 04/07/2016, 04/03/2017, 04/15/2018, 04/26/2020   Influenza-Unspecified 03/30/2015   PFIZER Comirnaty(Gray Top)Covid-19 Tri-Sucrose  Vaccine 09/30/2020   PFIZER(Purple Top)SARS-COV-2 Vaccination 03/14/2020, 04/04/2020   PPD Test 04/04/2017   Pneumococcal-Unspecified 04/29/2014    TDAP status: Due, Education has been provided regarding the importance of this vaccine. Advised may receive this vaccine at local pharmacy or Health Dept. Aware to provide a copy of the vaccination record if obtained from local pharmacy or Health Dept. Verbalized acceptance and understanding.  Flu Vaccine status: Due, Education has been provided regarding the importance of this vaccine. Advised may receive this vaccine at local pharmacy or Health Dept. Aware to provide a copy of the vaccination record if obtained from local pharmacy or Health Dept. Verbalized acceptance and understanding.  Pneumococcal vaccine status: Up to date  Covid-19 vaccine status: Completed vaccines  Qualifies for Shingles Vaccine? Yes   Zostavax completed No   Shingrix Completed?: No.    Education has been provided regarding the importance of this  vaccine. Patient has been advised to call insurance company to determine out of pocket expense if they have not yet received this vaccine. Advised may also receive vaccine at local pharmacy or Health Dept. Verbalized acceptance and understanding.  Screening Tests Health Maintenance  Topic Date Due   Hepatitis C Screening  Never done   TETANUS/TDAP  Never done   Zoster Vaccines- Shingrix (1 of 2) Never done   COLONOSCOPY (Pts 45-56yr Insurance coverage will need to be confirmed)  Never done   MAMMOGRAM  Never done   COVID-19 Vaccine (4 - Pfizer risk series) 11/25/2020   OPHTHALMOLOGY EXAM  05/31/2021   PAP SMEAR-Modifier  11/04/2021   INFLUENZA VACCINE  01/23/2022   HEMOGLOBIN A1C  07/05/2022   FOOT EXAM  08/16/2022   HIV Screening  Completed   HPV VACCINES  Aged Out    Health Maintenance  Health Maintenance Due  Topic Date Due   Hepatitis C Screening  Never done   TETANUS/TDAP  Never done   Zoster Vaccines- Shingrix (1 of 2) Never done   COLONOSCOPY (Pts 45-433yrInsurance coverage will need to be confirmed)  Never done   MAMMOGRAM  Never done   COVID-19 Vaccine (4 - Pfizer risk series) 11/25/2020   OPHTHALMOLOGY EXAM  05/31/2021   PAP SMEAR-Modifier  11/04/2021   INFLUENZA VACCINE  01/23/2022    Colorectal cancer screening: Referral to GI placed 02/13/2022. Pt aware the office will call re: appt.  Mammogram status: Ordered 04/20/2021. Pt provided with contact info and advised to call to schedule appt.   Bone Density status: Ordered 02/13/2022. Pt provided with contact info and advised to call to schedule appt.  Lung Cancer Screening: (Low Dose CT Chest recommended if Age 55-80ears, 30 pack-year currently smoking OR have quit w/in 15years.) does qualify.   Lung Cancer Screening Referral: No  Additional Screening:  Hepatitis C Screening: does qualify; Completed NO  Vision Screening: Recommended annual ophthalmology exams for early detection of glaucoma and other  disorders of the eye. Is the patient up to date with their annual eye exam?  No  Who is the provider or what is the name of the office in which the patient attends annual eye exams? N/A If pt is not established with a provider, would they like to be referred to a provider to establish care? No . Ordered placed on 09/06/2021  Dental Screening: Recommended annual dental exams for proper oral hygiene  Community Resource Referral / Chronic Care Management: CRR required this visit?  No   CCM required this visit?  No  Plan:     I have personally reviewed and noted the following in the patient's chart:   Medical and social history Use of alcohol, tobacco or illicit drugs  Current medications and supplements including opioid prescriptions. Patient is not currently taking opioid prescriptions. Functional ability and status Nutritional status Physical activity Advanced directives List of other physicians Hospitalizations, surgeries, and ER visits in previous 12 months Vitals Screenings to include cognitive, depression, and falls Referrals and appointments  In addition, I have reviewed and discussed with patient certain preventive protocols, quality metrics, and best practice recommendations. A written personalized care plan for preventive services as well as general preventive health recommendations were provided to patient.     Gomez Cleverly, [place]   02/13/2022   Nurse Notes: I spent 30 minutes on this telephone encounter  AVS mailed to patient

## 2022-02-13 NOTE — Telephone Encounter (Signed)
Pt called stating she was to have gotten a call from a home health agency and has not heard anything. I transferred to triage nurse. I do see the referral in the system but it says pending review

## 2022-02-13 NOTE — Telephone Encounter (Signed)
Faxed referral to [facility], confirmation has been received, patient notified 02/13/22 to call us if she has not heard anything by end of week.

## 2022-02-13 NOTE — Patient Instructions (Signed)
[name] , Thank you for taking time to come for your Medicare Wellness Visit. I appreciate your ongoing commitment to your health goals. Please review the following plan we discussed and let me know if I can assist you in the future.   These are the goals we discussed:  Goals      HEMOGLOBIN A1C < 7.0        This is a list of the screening recommended for you and due dates:  Health Maintenance  Topic Date Due   Hepatitis C Screening: USPSTF Recommendation to screen - Ages 60-79 yo.  Never done   Tetanus Vaccine  Never done   Zoster (Shingles) Vaccine (1 of 2) Never done   Colon Cancer Screening  Never done   Mammogram  Never done   COVID-19 Vaccine (4 - Pfizer risk series) 11/25/2020   Eye exam for diabetics  05/31/2021   Pap Smear  11/04/2021   Flu Shot  01/23/2022   Hemoglobin A1C  07/05/2022   Complete foot exam   08/16/2022   HIV Screening  Completed   HPV Vaccine  Aged Out  Health Maintenance, Female Adopting a healthy lifestyle and getting preventive care are important in promoting health and wellness. Ask your health care provider about: The right schedule for you to have regular tests and exams. Things you can do on your own to prevent diseases and keep yourself healthy. What should I know about diet, weight, and exercise? Eat a healthy diet  Eat a diet that includes plenty of vegetables, fruits, low-fat dairy products, and lean protein. Do not eat a lot of foods that are high in solid fats, added sugars, or sodium. Maintain a healthy weight Body mass index (BMI) is used to identify weight problems. It estimates body fat based on height and weight. Your health care provider can help determine your BMI and help you achieve or maintain a healthy weight. Get regular exercise Get regular exercise. This is one of the most important things you can do for your health. Most adults should: Exercise for at least 150 minutes each week. The exercise should increase your heart  rate and make you sweat (moderate-intensity exercise). Do strengthening exercises at least twice a week. This is in addition to the moderate-intensity exercise. Spend less time sitting. Even light physical activity can be beneficial. Watch cholesterol and blood lipids Have your blood tested for lipids and cholesterol at 55 years of age, then have this test every 5 years. Have your cholesterol levels checked more often if: Your lipid or cholesterol levels are high. You are older than 55 years of age. You are at high risk for heart disease. What should I know about cancer screening? Depending on your health history and family history, you may need to have cancer screening at various ages. This may include screening for: Breast cancer. Cervical cancer. Colorectal cancer. Skin cancer. Lung cancer. What should I know about heart disease, diabetes, and high blood pressure? Blood pressure and heart disease High blood pressure causes heart disease and increases the risk of stroke. This is more likely to develop in people who have high blood pressure readings or are overweight. Have your blood pressure checked: Every 3-5 years if you are 66-69 years of age. Every year if you are 57 years old or older. Diabetes Have regular diabetes screenings. This checks your fasting blood sugar level. Have the screening done: Once every three years after age 84 if you are at a normal weight and  have a low risk for diabetes. More often and at a younger age if you are overweight or have a high risk for diabetes. What should I know about preventing infection? Hepatitis B If you have a higher risk for hepatitis B, you should be screened for this virus. Talk with your health care provider to find out if you are at risk for hepatitis B infection. Hepatitis C Testing is recommended for: Everyone born from [birth year] through 1964. Anyone with known risk factors for hepatitis C. Sexually transmitted infections (STIs) Get  screened for STIs, including gonorrhea and chlamydia, if: You are sexually active and are younger than 55 years of age. You are older than 55 years of age and your health care provider tells you that you are at risk for this type of infection. Your sexual activity has changed since you were last screened, and you are at increased risk for chlamydia or gonorrhea. Ask your health care provider if you are at risk. Ask your health care provider about whether you are at high risk for HIV. Your health care provider may recommend a prescription medicine to help prevent HIV infection. If you choose to take medicine to prevent HIV, you should first get tested for HIV. You should then be tested every 3 months for as long as you are taking the medicine. Pregnancy If you are about to stop having your period (premenopausal) and you may become pregnant, seek counseling before you get pregnant. Take 400 to 800 micrograms (mcg) of folic acid every day if you become pregnant. Ask for birth control (contraception) if you want to prevent pregnancy. Osteoporosis and menopause Osteoporosis is a disease in which the bones lose minerals and strength with aging. This can result in bone fractures. If you are 58 years old or older, or if you are at risk for osteoporosis and fractures, ask your health care provider if you should: Be screened for bone loss. Take a calcium or vitamin D supplement to lower your risk of fractures. Be given hormone replacement therapy (HRT) to treat symptoms of menopause. Follow these instructions at home: Alcohol use Do not drink alcohol if: Your health care provider tells you not to drink. You are pregnant, may be pregnant, or are planning to become pregnant. If you drink alcohol: Limit how much you have to: 0-1 drink a day. Know how much alcohol is in your drink. In the [place] one drink equals one 12 oz bottle of beer (355 mL), one 5 oz glass of wine (148 mL), or one 1 oz glass of hard  liquor (44 mL). Lifestyle Do not use any products that contain nicotine or tobacco. These products include cigarettes, chewing tobacco, and vaping devices, such as e-cigarettes. If you need help quitting, ask your health care provider. Do not use street drugs. Do not share needles. Ask your health care provider for help if you need support or information about quitting drugs. General instructions Schedule regular health, dental, and eye exams. Stay current with your vaccines. Tell your health care provider if: You often feel depressed. You have ever been abused or do not feel safe at home. Summary Adopting a healthy lifestyle and getting preventive care are important in promoting health and wellness. Follow your health care provider's instructions about healthy diet, exercising, and getting tested or screened for diseases. Follow your health care provider's instructions on monitoring your cholesterol and blood pressure. This information is not intended to replace advice given to you by your health care provider. Make  sure you discuss any questions you have with your health care provider. Document Revised: 10/31/2020 Document Reviewed: 10/31/2020 Elsevier Patient Education  [place] for Falls Each year, millions of people have serious injuries from falls. It is important to understand your risk for falling. Talk with your health care provider about your risk and what you can do to lower it. There are actions you can take at home to lower your risk and prevent falls. If you do have a serious fall, make sure to tell your health care provider. Falling once raises your risk of falling again. How can falls affect me? Serious injuries from falls are common. These include: Broken bones, such as hip fractures. Head injuries, such as traumatic brain injuries (TBI) or concussion. A fear of falling can cause you to avoid activities and stay at home. This can make your  muscles weaker and actually raise your risk for a fall. What can increase my risk? There are a number of risk factors that increase your risk for falling. The more risk factors you have, the higher your risk of falling. Serious injuries from a fall happen most often to people older than age 23. Children and young adults ages 8-29 are also at higher risk. Common risk factors include: Weakness in the lower body. Lack (deficiency) of vitamin D. Being generally weak or confused due to long-term (chronic) illness. Dizziness or balance problems. Poor vision. Medicines that cause dizziness or drowsiness. These can include medicines for your blood pressure, heart, anxiety, insomnia, or edema, as well as pain medicines and muscle relaxants. Other risk factors include: Drinking alcohol. Having had a fall in the past. Having depression. Having foot pain or wearing improper footwear. Working at a dangerous job. Having any of the following in your home: Tripping hazards, such as floor clutter or loose rugs. Poor lighting. Pets. Having dementia or memory loss. What actions can I take to lower my risk of falling?     Physical activity Maintain physical fitness. Do strength and balance exercises. Consider taking a regular class to build strength and balance. Yoga and tai chi are good options. Vision Have your eyes checked every year and your vision prescription updated as needed. Walking aids and footwear Wear nonskid shoes. Do not wear high heels. Do not walk around the house in socks or slippers. Use a cane or walker as told by your health care provider. Home safety Attach secure railings on both sides of your stairs. Install grab bars for your tub, shower, and toilet. Use a bath mat in your tub or shower. Use good lighting in all rooms. Keep a flashlight near your bed. Make sure there is a clear path from your bed to the bathroom. Use night-lights. Do not use throw rugs. Make sure all  carpeting is taped or tacked down securely. Remove all clutter from walkways and stairways, including extension cords. Repair uneven or broken steps. Avoid walking on icy or slippery surfaces. Walk on the grass instead of on icy or slick sidewalks. Use ice melt to get rid of ice on walkways. Use a cordless phone. Questions to ask your health care provider Can you help me check my risk for a fall? Do any of my medicines make me more likely to fall? Should I take a vitamin D supplement? What exercises can I do to improve my strength and balance? Should I make an appointment to have my vision checked? Do I need a bone density test to check  for weak bones or osteoporosis? Would it help to use a cane or a walker? Where to find more information Centers for Disease Control and Prevention, [employer]: http://www.wolf.info/ Community-Based Fall Prevention Programs: http://www.wolf.info/ National Institute on Aging: http://kim-miller.com/ Contact a health care provider if: You fall at home. You are afraid of falling at home. You feel weak, drowsy, or dizzy. Summary Serious injuries from a fall happen most often to people older than age 39. Children and young adults ages 51-29 are also at higher risk. Talk with your health care provider about your risks for falling and how to lower those risks. Taking certain precautions at home can lower your risk for falling. If you fall, always tell your health care provider. This information is not intended to replace advice given to you by your health care provider. Make sure you discuss any questions you have with your health care provider. Document Revised: 01/13/2020 Document Reviewed: 01/13/2020 Elsevier Patient Education  [place].

## 2022-02-15 ENCOUNTER — Telehealth: Payer: Self-pay | Admitting: *Deleted

## 2022-02-15 NOTE — Telephone Encounter (Signed)
Thank you :)

## 2022-02-15 NOTE — Telephone Encounter (Signed)
Called [facility] to check on status of referral for home health, are in process of about to call patient to try to schedule a home visit.   Called patient to notify, no answer, lett a voice message w/ [facility]'s number to reach out to them as well.

## 2022-02-20 ENCOUNTER — Ambulatory Visit (INDEPENDENT_AMBULATORY_CARE_PROVIDER_SITE_OTHER): Payer: Medicare HMO | Admitting: Podiatry

## 2022-02-20 DIAGNOSIS — Z91199 Patient's noncompliance with other medical treatment and regimen due to unspecified reason: Secondary | ICD-10-CM

## 2022-02-20 NOTE — Progress Notes (Signed)
No show

## 2022-02-21 ENCOUNTER — Telehealth: Payer: Self-pay

## 2022-02-22 NOTE — Telephone Encounter (Signed)
No further notes are needed for this encounter

## 2022-02-23 ENCOUNTER — Ambulatory Visit: Payer: Medicare HMO | Admitting: Podiatry

## 2022-03-07 ENCOUNTER — Ambulatory Visit: Payer: Medicare HMO | Admitting: Podiatry

## 2022-03-08 ENCOUNTER — Telehealth: Payer: Self-pay | Admitting: Licensed Clinical Social Worker

## 2022-03-08 NOTE — Patient Outreach (Signed)
  Care Coordination   03/08/2022 Name: [name] MRN: [id number] DOB: Sep 27, 1965   Care Coordination Outreach Attempts:  An unsuccessful telephone outreach was attempted today to offer the patient information about available care coordination services as a benefit of their health plan.   Follow Up Plan:  Additional outreach attempts will be made to offer the patient care coordination information and services.   Encounter Outcome:  No Answer  Care Coordination Interventions Activated:  No   Care Coordination Interventions:  No, not indicated    Christa See, MSW, [email or web address] Phone [phone] 10:54 AM

## 2022-03-09 ENCOUNTER — Encounter: Payer: Self-pay | Admitting: Cardiovascular Disease

## 2022-03-09 ENCOUNTER — Ambulatory Visit: Payer: Medicare HMO | Attending: Cardiovascular Disease | Admitting: Cardiovascular Disease

## 2022-03-09 DIAGNOSIS — E7800 Pure hypercholesterolemia, unspecified: Secondary | ICD-10-CM

## 2022-03-09 DIAGNOSIS — R6889 Other general symptoms and signs: Secondary | ICD-10-CM | POA: Diagnosis not present

## 2022-03-09 DIAGNOSIS — I10 Essential (primary) hypertension: Secondary | ICD-10-CM | POA: Diagnosis not present

## 2022-03-09 MED ORDER — LOSARTAN POTASSIUM 25 MG PO TABS: 25.0000 mg | ORAL_TABLET | Freq: Every day | ORAL | 3 refills | Status: DC

## 2022-03-09 MED ORDER — ROSUVASTATIN CALCIUM 20 MG PO TABS: 20.0000 mg | ORAL_TABLET | Freq: Every day | ORAL | 3 refills | Status: DC

## 2022-03-09 MED ORDER — METOPROLOL TARTRATE 25 MG PO TABS: 12.5000 mg | ORAL_TABLET | Freq: Two times a day (BID) | ORAL | 3 refills | Status: DC

## 2022-03-09 MED ORDER — FUROSEMIDE 40 MG PO TABS: 40.0000 mg | ORAL_TABLET | Freq: Every day | ORAL | 3 refills | Status: DC

## 2022-03-09 NOTE — Patient Instructions (Addendum)
Medication Instructions:  Your physician has recommended you make the following change in your medication:  1-Increase Rosuvastatin 20 mg by mouth daily.  *If you need a refill on your cardiac medications before your next appointment, please call your pharmacy*  Lab Work: If you have labs (blood work) drawn today and your tests are completely normal, you will receive your results only by: [place] (if you have MyChart) OR A paper copy in the mail If you have any lab test that is abnormal or we need to change your treatment, we will call you to review the results.  Testing/Procedures: None ordered today.  Follow-Up: At [facility], you and your health needs are our priority.  As part of our continuing mission to provide you with exceptional heart care, we have created designated Provider Care Teams.  These Care Teams include your primary Cardiologist (physician) and Advanced Practice Providers (APPs -  Physician Assistants and Nurse Practitioners) who all work together to provide you with the care you need, when you need it.  We recommend signing up for the patient portal called "MyChart".  Sign up information is provided on this After Visit Summary.  MyChart is used to connect with patients for Virtual Visits (Telemedicine).  Patients are able to view lab/test results, encounter notes, upcoming appointments, etc.  Non-urgent messages can be sent to your provider as well.   To learn more about what you can do with MyChart, go to [email or web address].    Your next appointment:   12 month(s)  The format for your next appointment:   In Person  Provider:   Mertie Moores, MD     Important Information About Sugar

## 2022-03-09 NOTE — Progress Notes (Signed)
Cardiology Office Note:    Date:  03/09/2022   ID:  [name], DOB 1965-11-06, MRN [id number]  PCP:  [name], MD  Cardiologist:  Mertie Moores, MD   Referring MD: [name], MD   Problem list 1.  Coronary artery disease-status post coronary artery bypass grafting 2.  Poorly controlled diabetes mellitus-complicated by noncompliance and neuropathy 3.  Hyperlipidemia  Chief Complaint  Patient presents with   Coronary Artery Disease          Sept. 10, 2019   Collen [name] is a 56 y.o. female with a hx of poorly controlled diabetes mellitus and coronary artery disease.  She is status post coronary artery bypass grafting on January 14, 2018: She underwent CABG x 3 utilizing LIMA to LAD, SVG to PDA, and SVG to OM.  She is been to the emergency room over 50 times in the past year for diabetic ketoacidosis and gastroparesis. She was in the emergency room on September 1 with intractable nausea and vomiting.  She was not in DKA at that time.  Seen in the office for the first time today . Diabetes is under fairly good control currently   Has lots of issues with gastroparesis   Sept. 11, 2020  [name] is seen today for follow up visit Having issues with gastroparesis  No CP .    Nov. 4, 2021 [name] is seen for a work in visit for worsening dyspnea.  We had her take her furosemide for 3 day s straight which resolved her dyspnea.  Was previously taking the furosemide QOD .  Eats chicken pot pies several times a week .   Eats soup once very 2 weeks . Sausage in the AM,  Echo from Nov. 2020 shows normal LV systolic function ,   Was given script for atorvastatin ,  She did not take  Will change to rosuvastatin 10 mg a day   March 09, 2022:  [name] is seen for follow up of her  CAD, CABG, poorly controlled DM Still eating sweets, lots of carbs .    Echo shows that she has normal heart function. She is fallen twice since I last saw her 2 years ago.   She broke her femur on 1 fall in her to be on another fall. She now has a foot drop.  She has been is in a skilled nursing facility for a while but now lives at home.  Is breathing better than she was 2 years ago   We discussed tightening up on her diet. She is not able to walk because of her foot drop I suggest a stationary bike . She has not thought about a bike  Has a gym in her appt complex  Is concerned about exercising alone .     Past Medical History:  Diagnosis Date   AKI (acute kidney injury) ([place])    Anemia, iron deficiency    Anxiety and depression 05/18/2015   CAD (coronary artery disease), native coronary artery 01/11/2018   s/p CABG - Non-STEMI with severe three-vessel disease noted July 2019   Depression    Diabetic gastroparesis ([place])    Diabetic neuropathy, type I diabetes mellitus ([place]) 05/18/2015   DKA, type 1 ([place]) 03/18/2016   Essential hypertension    Gastroparesis    GERD (gastroesophageal reflux disease)    HLD (hyperlipidemia)    MDD (major depressive disorder), single episode, severe , no psychosis ([place])    Tardive dyskinesia    Vitamin B12  deficiency 08/16/2015    Past Surgical History:  Procedure Laterality Date   CARDIAC SURGERY     COLONOSCOPY  09/27/2014   at [place] N/A 01/14/2018   Procedure: CORONARY ARTERY BYPASS GRAFTING (CABG)X3, RIGHT AND LEFT SAPHENOUS VEIN HARVEST, MAMMARY TAKE DOWN. MAMMARY TO LAD, SVG TO PD, SVG TO DISTAL CIRC.;  Surgeon: Grace Isaac, MD;  Location: [facility];  Service: Open Heart Surgery;  Laterality: N/A;   ESOPHAGOGASTRODUODENOSCOPY  09/27/2014   at [facility], Dr Rolan Lipa. biospy neg for celiac, neg for H pylori.    EYE SURGERY     gailstones     INTRAMEDULLARY (IM) NAIL INTERTROCHANTERIC Right 05/10/2019   Procedure: INTRAMEDULLARY (IM) NAIL INTERTROCHANTRIC;  Surgeon: Lovell Sheehan, MD;  Location: [facility];  Service: Orthopedics;  Laterality: Right;   IR FLUORO GUIDE CV LINE  RIGHT  02/01/2017   IR FLUORO GUIDE CV LINE RIGHT  03/06/2017   IR FLUORO GUIDE CV LINE RIGHT  03/25/2017   IR GENERIC HISTORICAL  01/24/2016   IR FLUORO GUIDE CV LINE RIGHT 01/24/2016 Darrell K Allred, PA-C [facility]   IR GENERIC HISTORICAL  01/24/2016   IR US GUIDE VASC ACCESS RIGHT 01/24/2016 Darrell K Allred, PA-C [facility]   IR US GUIDE VASC ACCESS RIGHT  02/01/2017   IR US GUIDE VASC ACCESS RIGHT  03/06/2017   IR US GUIDE VASC ACCESS RIGHT  03/25/2017   IR US GUIDE VASC ACCESS RIGHT  01/20/2019   IR VENIPUNCTURE 57YRS/OLDER BY MD  01/20/2019   LEFT HEART CATH AND CORONARY ANGIOGRAPHY N/A 01/07/2018   Procedure: LEFT HEART CATH AND CORONARY ANGIOGRAPHY;  Surgeon: Troy Sine, MD;  Location: [place] [facility];  Service: Cardiovascular;  Laterality: N/A;   POSTERIOR VITRECTOMY AND MEMBRANE PEEL-LEFT EYE  09/28/2002   POSTERIOR VITRECTOMY AND MEMBRANE PEEL-RIGHT EYE  03/16/2002   RETINAL DETACHMENT SURGERY     TEE WITHOUT CARDIOVERSION N/A 01/14/2018   Procedure: TRANSESOPHAGEAL ECHOCARDIOGRAM (TEE);  Surgeon: Grace Isaac, MD;  Location: [facility];  Service: Open Heart Surgery;  Laterality: N/A;    Current Medications: Current Meds  Medication Sig   acetaminophen (TYLENOL) 500 MG tablet Take 1,000 mg by mouth at bedtime as needed for mild pain.   aspirin EC 81 MG tablet Take 1 tablet (81 mg total) by mouth daily.   B-D INS SYRINGE 0.5CC/31GX5/16 31G X 5/16" 0.5 ML MISC USE FOUR TIMES DAILY FOR INSULIN   busPIRone (BUSPAR) 10 MG tablet Take 1 tablet (10 mg total) by mouth 3 (three) times daily.   insulin aspart (NOVOLOG) 100 UNIT/ML injection Inject 100 Units into pump (Patient taking differently: Inject 125 Units into the skin continuous. 125 units via pump over 3 days)   insulin aspart (NOVOLOG) 100 UNIT/ML injection Inject 7 Units into the skin 3 (three) times daily before meals.   Insulin Degludec (TRESIBA) 100 UNIT/ML SOLN Inject 20 Units into the skin daily.   Insulin Syringes,  Disposable, U-100 0.3 ML MISC Use to draw up insulin as instructed   metoCLOPramide (REGLAN) 10 MG tablet TAKE 1 TABLET(10 MG) BY MOUTH THREE TIMES DAILY BEFORE MEALS   ondansetron (ZOFRAN) 4 MG tablet Take 1 tablet (4 mg total) by mouth every 8 (eight) hours as needed for nausea or vomiting.   pregabalin (LYRICA) 150 MG capsule Take 150 mg by mouth 2 (two) times daily.   silver sulfADIAZINE (SILVADENE) 1 % cream Apply 1 Application topically daily.   [  DISCONTINUED] furosemide (LASIX) 40 MG tablet Take 1 tablet (40 mg total) by mouth daily.   [DISCONTINUED] losartan (COZAAR) 25 MG tablet Take 1 tablet (25 mg total) by mouth daily. Hold this medication if your systolic blood pressure ( top number) s less than 100.  Please call your doctor is your systolic blood pressure is less than 100   [DISCONTINUED] metoprolol tartrate (LOPRESSOR) 25 MG tablet Take 0.5 tablets (12.5 mg total) by mouth 2 (two) times daily. Please check your blood pressure twice a day, please hold this medication if your systolic blood pressure ( top number) is less than 100, please call your doctor if your systolic blood pressure is less than 100.   [DISCONTINUED] pregabalin (LYRICA) 75 MG capsule Take 1 capsule (75 mg total) by mouth 2 (two) times daily.   [DISCONTINUED] rosuvastatin (CRESTOR) 10 MG tablet Take 20 mg by mouth daily.     Allergies:   Anesthetics, amide; Chlorhexidine; Penicillins; Enkaid [encainide]; and Subutex [buprenorphine]   Social History   Socioeconomic History   Marital status: Single    Spouse name: Not on file   Number of children: 1   Years of education: Not on file   Highest education level: Not on file  Occupational History   Occupation: Unemployed  Tobacco Use   Smoking status: Never   Smokeless tobacco: Never  Vaping Use   Vaping Use: Never used  Substance and Sexual Activity   Alcohol use: No   Drug use: Yes    Types: Marijuana    Comment: occ   Sexual activity: Yes    Partners:  Male    Birth control/protection: None  Other Topics Concern   Not on file  Social History Narrative   Not on file   Social Determinants of Health   Financial Resource Strain: Low Risk  (02/13/2022)   Overall Financial Resource Strain (CARDIA)    Difficulty of Paying Living Expenses: Not very hard  Food Insecurity: No Food Insecurity (02/13/2022)   Hunger Vital Sign    Worried About Running Out of Food in the Last Year: Never true    Ran Out of Food in the Last Year: Never true  Transportation Needs: No Transportation Needs (02/13/2022)   PRAPARE - Transportation    Lack of Transportation (Medical): No    Lack of Transportation (Non-Medical): No  Physical Activity: Inactive (02/13/2022)   Exercise Vital Sign    Days of Exercise per Week: 0 days    Minutes of Exercise per Session: 0 min  Stress: Stress Concern Present (02/13/2022)   [place]    Feeling of Stress : To some extent  Social Connections: Moderately Isolated (02/13/2022)   Social Connection and Isolation Panel [NHANES]    Frequency of Communication with Friends and Family: More than three times a week    Frequency of Social Gatherings with Friends and Family: Three times a week    Attends Religious Services: More than 4 times per year    Active Member of Clubs or Organizations: No    Attends Archivist Meetings: Never    Marital Status: Never married     Family History: The patient's family history includes Cystic fibrosis in her mother; Diabetes in her brother; Hypertension in her father and maternal grandmother.  ROS:   Please see the history of present illness.     All other systems reviewed and are negative.  EKGs/Labs/Other Studies Reviewed:    The following  studies were reviewed today:   EKG:      Recent Labs: 10/05/2021: Magnesium 1.9 01/06/2022: TSH 0.960 01/07/2022: ALT 15 01/10/2022: BUN 18; Creatinine, Ser 1.25; Hemoglobin  10.5; Platelets 236; Potassium 4.0; Sodium 140  Recent Lipid Panel    Component Value Date/Time   CHOL 183 08/09/2021 1151   TRIG 104.0 08/09/2021 1151   HDL 57.50 08/09/2021 1151   CHOLHDL 3 08/09/2021 1151   VLDL 20.8 08/09/2021 1151   LDLCALC 105 (H) 08/09/2021 1151     Physical Exam: Blood pressure 128/64, pulse 73, height '5\' 1"'$  (1.549 m), SpO2 99 %.  GEN:  Well nourished, well developed in no acute distress HEENT: Normal NECK: No JVD; No carotid bruits LYMPHATICS: No lymphadenopathy CARDIAC: RRR , no murmurs, rubs, gallops RESPIRATORY:  Clear to auscultation without rales, wheezing or rhonchi  ABDOMEN: Soft, non-tender, non-distended MUSCULOSKELETAL:  No edema; No deformity  SKIN: Warm and dry NEUROLOGIC:  Alert and oriented x 3    ASSESSMENT:    1. Hypercholesterolemia   2. Essential hypertension    PLAN:       1.  Coronary artery disease: no angina .    S/p CABG .    She has become somewhat deconditioned, partly because of fracture of her femur and fracture of her tibia.  She is now walking with a walker.  I think she would benefit greatly from a regular exercise program.  I think that she will likely need a referral to physical therapy with some instructions on which exercises she can safely do.    2. Shortness of breath:      3.  Leg edema :  Better after hight dose of lasix   4.  Diabetes:   Has diabetic sclerederma.   Her last hemoglobin A1c was 7.5.  I encouraged her to work on carbohydrate reduction.   Medication Adjustments/Labs and Tests Ordered: Current medicines are reviewed at length with the patient today.  Concerns regarding medicines are outlined above.  No orders of the defined types were placed in this encounter.  Meds ordered this encounter  Medications   rosuvastatin (CRESTOR) 20 MG tablet    Sig: Take 1 tablet (20 mg total) by mouth daily.    Dispense:  90 tablet    Refill:  3   furosemide (LASIX) 40 MG tablet    Sig: Take 1  tablet (40 mg total) by mouth daily.    Dispense:  90 tablet    Refill:  3   losartan (COZAAR) 25 MG tablet    Sig: Take 1 tablet (25 mg total) by mouth daily. Hold this medication if your systolic blood pressure ( top number) s less than 100. Please call your doctor is your systolic blood pressure is less than 100    Dispense:  90 tablet    Refill:  3   metoprolol tartrate (LOPRESSOR) 25 MG tablet    Sig: Take 0.5 tablets (12.5 mg total) by mouth 2 (two) times daily. Please check your blood pressure twice a day, please hold this medication if your systolic blood pressure ( top number) is less than 100, please call your doctor if your systolic blood pressure is less than 100.    Dispense:  90 tablet    Refill:  3     Patient Instructions  Medication Instructions:  Your physician has recommended you make the following change in your medication:  1-Increase Rosuvastatin 20 mg by mouth daily.  *If you need a refill on  your cardiac medications before your next appointment, please call your pharmacy*  Lab Work: If you have labs (blood work) drawn today and your tests are completely normal, you will receive your results only by: [place] (if you have MyChart) OR A paper copy in the mail If you have any lab test that is abnormal or we need to change your treatment, we will call you to review the results.  Testing/Procedures: None ordered today.  Follow-Up: At [facility], you and your health needs are our priority.  As part of our continuing mission to provide you with exceptional heart care, we have created designated Provider Care Teams.  These Care Teams include your primary Cardiologist (physician) and Advanced Practice Providers (APPs -  Physician Assistants and Nurse Practitioners) who all work together to provide you with the care you need, when you need it.  We recommend signing up for the patient portal called "MyChart".  Sign up information is provided on this  After Visit Summary.  MyChart is used to connect with patients for Virtual Visits (Telemedicine).  Patients are able to view lab/test results, encounter notes, upcoming appointments, etc.  Non-urgent messages can be sent to your provider as well.   To learn more about what you can do with MyChart, go to [email or web address].    Your next appointment:   12 month(s)  The format for your next appointment:   In Person  Provider:   Mertie Moores, MD     Important Information About Sugar         Signed, Mertie Moores, MD  03/09/2022 1:50 PM    [place]

## 2022-03-13 ENCOUNTER — Ambulatory Visit: Payer: Medicare HMO | Admitting: Podiatrist

## 2022-03-13 ENCOUNTER — Telehealth: Payer: Self-pay | Admitting: Emergency Medicine

## 2022-03-13 ENCOUNTER — Encounter: Payer: Self-pay | Admitting: Podiatrist

## 2022-03-13 ENCOUNTER — Other Ambulatory Visit: Payer: Self-pay

## 2022-03-13 DIAGNOSIS — L97411 Non-pressure chronic ulcer of right heel and midfoot limited to breakdown of skin: Secondary | ICD-10-CM

## 2022-03-13 DIAGNOSIS — R6889 Other general symptoms and signs: Secondary | ICD-10-CM | POA: Diagnosis not present

## 2022-03-13 DIAGNOSIS — L97421 Non-pressure chronic ulcer of left heel and midfoot limited to breakdown of skin: Secondary | ICD-10-CM

## 2022-03-13 DIAGNOSIS — E2839 Other primary ovarian failure: Secondary | ICD-10-CM

## 2022-03-13 DIAGNOSIS — E1142 Type 2 diabetes mellitus with diabetic polyneuropathy: Secondary | ICD-10-CM

## 2022-03-13 MED ORDER — DOXYCYCLINE HYCLATE 100 MG PO TABS: 100.0000 mg | ORAL_TABLET | Freq: Two times a day (BID) | ORAL | 0 refills | Status: DC

## 2022-03-13 MED ORDER — MUPIROCIN 2 % EX OINT: 1.0000 | TOPICAL_OINTMENT | Freq: Two times a day (BID) | CUTANEOUS | 2 refills | Status: DC

## 2022-03-13 NOTE — Progress Notes (Signed)
Chief Complaint  Patient presents with   Diabetic Ulcer    Diabetic ulcer on the right heel, dry skin, sore, rate of pain 6 out of 10, some bleeding and drainage, left heel is dry also, A1c- 8.8 BG- 146     HPI: Patient is 56 y.o. female who presents today for follow-up of decubitus heel ulcers posterior heels.  She states these started in June when she was in the hospital.  She relates she is prone to developing these and has had them in the past.  She relates home health came to her house however she decided that she did not want to use them because she did not want to keep the dressing on and not be able to bathe.  She has reconsidered and would like to have home health come and help her.  She states she is unable to do dressing changes on her own.  She relates she has noticed some bleeding and from the right posterior heel wound. She is not wearing surgical shoes today-  wearing [employer] slides.  Does not report using heel offloading booties-  she relates she is using pillows to float her heels at home.   Allergies  Allergen Reactions   Anesthetics, Amide Nausea And Vomiting   Chlorhexidine Other (See Comments)    Unknown reaction   Penicillins Diarrhea, Nausea And Vomiting and Other (See Comments)   Enkaid [Encainide] Nausea And Vomiting   Subutex [Buprenorphine] Rash    Review of systems is negative except as noted in the HPI.  Denies nausea/ vomiting/ fevers/ chills or night sweats.   Denies difficulty breathing, denies calf pain or tenderness  Physical Exam  Patient is awake, alert, and oriented x 3.  In no acute distress.    Vascular status is intact with palpable pedal pulses DP and PT bilateral and capillary refill time less than 3 seconds bilateral.  No edema or erythema noted.   Bilateral heel ulcers noted to posterior heel. On right measures about 2 cm x 2 cm x 0.1 cm with a red granular base.  No pus or drainage noted.  No malodor or undermining noted. Generalized swelling of  the right foot and ankle noted in comparison with the left.  Heel wound left measures 1.8 cm x 0.5 cm x 0.1 cm with intact eschar present . No erythema edema or purulence noted.   Musculoskeletal: contracture of lesser digits noted.  Deceased ROM in DF of ankle joint.  Neurological: Sensation intact to light touch. Foot drop right with walker assistance     Assessment:   ICD-10-CM   1. Skin ulcer of right heel, limited to breakdown of skin ([place])  L97.411     2. Skin ulcer of left heel, limited to breakdown of skin ([place])  L97.421     3. Diabetic peripheral neuropathy associated with type 2 diabetes mellitus ([place])  E11.42         Plan:  Patient was evaluated and treated and all questions answered. Ulcer bilateral heels limited to breakdown of skin- right worse than left  -Debridement as below. -Dressed with silvadene, biatin dressing  -mupirocin ointment sent to pharmacy.  -will RE order home health services.   -recommended continued use of surgical shoes.  Also discussed purchasing heel cushions to wear at night and when in her recliner.  -doxycycline called in due to the swelling noted on the right foot compared to the left.  -Discussed if any worsening redness, pain, fever or chills to call  or may need to report to the emergency room. Patient expressed understanding.    Procedure: Excisional Debridement of Wound-  Right  Rationale: Removal of non-viable soft tissue from the wound to promote healing.  Anesthesia: none Pre-Debridement Wound Measurements: callused skin periwound  Post-Debridement Wound Measurements: 3 cm x 2 cm x 0.1 cm (right)  Type of Debridement: Sharp Excisional Tissue Removed: Non-viable soft tissue Depth of Debridement: subcutaneous tissue. Technique: Sharp excisional debridement of necrotic periwound tissue with tissue nipper. To red granular base.  Dressing:  silvadene cream and biatin skin dressing.and ace wrap.  Disposition: Patient tolerated procedure  well. Patient to return in 2 week for follow-up.

## 2022-03-13 NOTE — Telephone Encounter (Signed)
[name] requesting a call, patient Dx was incorrect

## 2022-03-13 NOTE — Telephone Encounter (Signed)
DX code has been updated.

## 2022-03-14 ENCOUNTER — Telehealth: Payer: Self-pay | Admitting: Family Medicine

## 2022-03-14 NOTE — Telephone Encounter (Signed)
Call returned to patient.  She explained that she received a call from [place]; but did not know if [place] still worked at [employer].  I told her that Delana Meyer is now with [facility] and she has tried to reach her. I gave the patient Jasmine's number and she said she will call her back.  She asked about Medicaid and wanted to know if the expansion was approved. I told her not yet.  She thinks that she may qualify for Medicaid with the expansion even though her income is $ 1596/month.   She also asked about food stamps  She now lives on her own and has more expenses and would like to know if she would qualify. I provided here with the phone number for [place] to call with her questions.

## 2022-03-14 NOTE — Telephone Encounter (Signed)
Copied from [place] [id number]. Topic: General - Other >> Mar 14, 2022 11:14 AM Leitha Schuller wrote: Pt requesting a cb from Opal Sidles   Please assist further

## 2022-03-22 ENCOUNTER — Ambulatory Visit: Payer: Self-pay | Admitting: Endocrinology

## 2022-03-22 ENCOUNTER — Telehealth: Payer: Self-pay | Admitting: Licensed Clinical Social Worker

## 2022-03-22 NOTE — Patient Outreach (Signed)
  Care Coordination   03/22/2022 Name: [name] MRN: [id number] DOB: 29-Oct-1965   Care Coordination Outreach Attempts:  A second unsuccessful outreach was attempted today to offer the patient with information about available care coordination services as a benefit of their health plan.     Follow Up Plan:  Additional outreach attempts will be made to offer the patient care coordination information and services.   Encounter Outcome:  No Answer  Care Coordination Interventions Activated:  No   Care Coordination Interventions:  No, not indicated    Christa See, MSW, [email or web address] Phone [phone] 4:40 PM

## 2022-03-23 ENCOUNTER — Other Ambulatory Visit: Payer: Self-pay | Admitting: Podiatry

## 2022-03-23 ENCOUNTER — Telehealth: Payer: Self-pay | Admitting: *Deleted

## 2022-03-23 DIAGNOSIS — L97411 Non-pressure chronic ulcer of right heel and midfoot limited to breakdown of skin: Secondary | ICD-10-CM

## 2022-03-23 DIAGNOSIS — B379 Candidiasis, unspecified: Secondary | ICD-10-CM

## 2022-03-23 MED ORDER — FLUCONAZOLE 150 MG PO TABS: 150.0000 mg | ORAL_TABLET | Freq: Once | ORAL | 0 refills | Status: AC

## 2022-03-23 NOTE — Telephone Encounter (Signed)
Faxed referral to [facility]-03/23/22, confirmation received.

## 2022-03-23 NOTE — Progress Notes (Signed)
Sent rx for fluconazole for yeast infection per patient request

## 2022-03-23 NOTE — Telephone Encounter (Signed)
-----   Message from Bronson Ing, [place] sent at 03/13/2022 12:28 PM EDT ----- This patient needs home health RE ordered for her-  thx!!

## 2022-03-23 NOTE — Telephone Encounter (Signed)
Paient is calling for something for a yeast infection that was caused by antibiotic (doxycycline)prescribed by our office,would like doctor to send in something to pharmacy.  She does not feel that she should have to wait make an appointment w/ PCP, to get a prescription.

## 2022-03-23 NOTE — Telephone Encounter (Signed)
Patient called the antibiotic she was put on gave her a yeast infection , she would like you to call something in for that to : [facility] on [address]

## 2022-04-03 ENCOUNTER — Ambulatory Visit: Payer: Medicare HMO | Admitting: Podiatry

## 2022-04-03 DIAGNOSIS — L97421 Non-pressure chronic ulcer of left heel and midfoot limited to breakdown of skin: Secondary | ICD-10-CM | POA: Diagnosis not present

## 2022-04-03 DIAGNOSIS — L97411 Non-pressure chronic ulcer of right heel and midfoot limited to breakdown of skin: Secondary | ICD-10-CM | POA: Diagnosis not present

## 2022-04-03 DIAGNOSIS — R6889 Other general symptoms and signs: Secondary | ICD-10-CM | POA: Diagnosis not present

## 2022-04-03 NOTE — Patient Instructions (Signed)
Call [phone] to schedule your vascular testing:  [facility] [address], [place], [place] [address]  

## 2022-04-08 NOTE — Progress Notes (Signed)
  Subjective:  Patient ID: Tyreanna Bisesi, female    DOB: 31-Jul-1965,  MRN: [id number]  Chief Complaint  Patient presents with   Foot Ulcer    Wound check 2 weeks/ sore on right heel that is bleeding    56 y.o. female presents with the above complaint. History confirmed with patient. It is often painful. She has been applying mupirocin ointment. She cannot reach the foot easily to change or apply the dressings herself. She lives alone  Objective:  Physical Exam: Foot is warm, non palpable DP/PT to my exam, she has full thickness ulcerations bilateral heel with no signs of infection, granular wound bed and surrounding hyperkeratosis      Assessment:   1. Skin ulcer of right heel, limited to breakdown of skin ([place])   2. Skin ulcer of left heel, limited to breakdown of skin ([place])      Plan:  Patient was evaluated and treated and all questions answered.  We discussed offloading of the wound. She is having a difficult time taking care of these at home on her own. Referral placed for home nursing care to apply and change dressings. She refused to have the wounds dressed here today which I had advised, even with a silicone foam bordered dressing. Continue bactroban and leave OTA. Recommended ABI as well to evaluate flow, referral placed and she will schedule  Return in about 1 month (around 05/04/2022) for wound care.

## 2022-04-09 NOTE — Progress Notes (Signed)
Referral packet was faxed over to [facility].

## 2022-04-12 ENCOUNTER — Ambulatory Visit: Payer: Self-pay

## 2022-04-12 NOTE — Patient Outreach (Signed)
  Care Coordination   04/12/2022 Name: [name] MRN: [id number] DOB: May 04, 1966   Care Coordination Outreach Attempts:  A third unsuccessful outreach was attempted today to offer the patient with information about available care coordination services as a benefit of their health plan.   Follow Up Plan:  No further outreach attempts will be made at this time. We have been unable to contact the patient to offer or enroll patient in care coordination services  Encounter Outcome:  No Answer  Care Coordination Interventions Activated:  No   Care Coordination Interventions:  No, not indicated    Daneen Schick, BSW, CDP Social Worker, Certified Dementia Practitioner [place] Coordination [phone]

## 2022-04-13 DIAGNOSIS — M21371 Foot drop, right foot: Secondary | ICD-10-CM | POA: Diagnosis not present

## 2022-04-13 DIAGNOSIS — L89622 Pressure ulcer of left heel, stage 2: Secondary | ICD-10-CM | POA: Diagnosis not present

## 2022-04-13 DIAGNOSIS — E1143 Type 2 diabetes mellitus with diabetic autonomic (poly)neuropathy: Secondary | ICD-10-CM | POA: Diagnosis not present

## 2022-04-13 DIAGNOSIS — K219 Gastro-esophageal reflux disease without esophagitis: Secondary | ICD-10-CM | POA: Diagnosis not present

## 2022-04-13 DIAGNOSIS — I10 Essential (primary) hypertension: Secondary | ICD-10-CM | POA: Diagnosis not present

## 2022-04-13 DIAGNOSIS — E785 Hyperlipidemia, unspecified: Secondary | ICD-10-CM | POA: Diagnosis not present

## 2022-04-13 DIAGNOSIS — L89612 Pressure ulcer of right heel, stage 2: Secondary | ICD-10-CM | POA: Diagnosis not present

## 2022-04-13 DIAGNOSIS — K3184 Gastroparesis: Secondary | ICD-10-CM | POA: Diagnosis not present

## 2022-04-13 DIAGNOSIS — F419 Anxiety disorder, unspecified: Secondary | ICD-10-CM | POA: Diagnosis not present

## 2022-04-17 ENCOUNTER — Ambulatory Visit (HOSPITAL_COMMUNITY)
Admission: RE | Admit: 2022-04-17 | Discharge: 2022-04-17 | Disposition: A | Discharge status: Home / Self Care | Payer: Medicare HMO | Source: Ambulatory Visit | Attending: Podiatry | Admitting: Podiatry

## 2022-04-17 DIAGNOSIS — L97421 Non-pressure chronic ulcer of left heel and midfoot limited to breakdown of skin: Secondary | ICD-10-CM

## 2022-04-17 DIAGNOSIS — L97411 Non-pressure chronic ulcer of right heel and midfoot limited to breakdown of skin: Secondary | ICD-10-CM

## 2022-04-17 DIAGNOSIS — R6889 Other general symptoms and signs: Secondary | ICD-10-CM | POA: Diagnosis not present

## 2022-04-26 ENCOUNTER — Telehealth: Payer: Self-pay

## 2022-04-26 NOTE — Telephone Encounter (Signed)
Message received that patient is requesting a call back.  I called [phone]  and the voicemail was full, unable to leave a message

## 2022-05-01 ENCOUNTER — Other Ambulatory Visit: Payer: Self-pay | Admitting: Endocrinology

## 2022-05-01 ENCOUNTER — Telehealth: Payer: Self-pay | Admitting: Endocrinology

## 2022-05-01 DIAGNOSIS — E1065 Type 1 diabetes mellitus with hyperglycemia: Secondary | ICD-10-CM

## 2022-05-01 NOTE — Telephone Encounter (Signed)
MEDICATION:  B-D INS SYRINGE 0.5CC/31GX5/16 B-D INS SYRINGE 0.5CC/31GX5/16 31G X 5/16" 0.5 ML MISC    PHARMACY:    [id number] [address] [place], [place] [address] (Ph: [phone])    HAS THE PATIENT CONTACTED THEIR PHARMACY?  Yes  IS THIS A 90 DAY SUPPLY : Yes  IS PATIENT OUT OF MEDICATION: Yes  IF NOT; HOW MUCH IS LEFT:   LAST APPOINTMENT DATE: '@7'$ /04/2022  NEXT APPOINTMENT DATE:'@1'$ /02/2023  DO WE HAVE YOUR PERMISSION TO LEAVE A DETAILED MESSAGE?:Yes  OTHER COMMENTS:    **Let patient know to contact pharmacy at the end of the day to make sure medication is ready. **  ** Please notify patient to allow 48-72 hours to process**  **Encourage patient to contact the pharmacy for refills or they can request refills through [facility]**

## 2022-05-01 NOTE — Telephone Encounter (Signed)
Rx sent to preferred pharmacy.

## 2022-05-03 ENCOUNTER — Telehealth: Payer: Self-pay

## 2022-05-03 ENCOUNTER — Ambulatory Visit: Payer: Medicare HMO | Admitting: Podiatry

## 2022-05-03 DIAGNOSIS — L97421 Non-pressure chronic ulcer of left heel and midfoot limited to breakdown of skin: Secondary | ICD-10-CM | POA: Diagnosis not present

## 2022-05-03 DIAGNOSIS — L97411 Non-pressure chronic ulcer of right heel and midfoot limited to breakdown of skin: Secondary | ICD-10-CM | POA: Diagnosis not present

## 2022-05-03 DIAGNOSIS — R6889 Other general symptoms and signs: Secondary | ICD-10-CM | POA: Diagnosis not present

## 2022-05-03 NOTE — Telephone Encounter (Signed)
Patient requesting refill on pregablin

## 2022-05-03 NOTE — Progress Notes (Signed)
  Subjective:  Patient ID: [name], female    DOB: Sep 16, 1965,  MRN: [id number]  Chief Complaint  Patient presents with   Foot Ulcer    4 week follow up right heel    56 y.o. female presents with the above complaint. History confirmed with patient she says it is doing better the home nursing has ceased coming to her because it was not draining anymore  Objective:  Physical Exam: Foot is warm, non palpable DP/PT to my exam, ulcerations have nearly fully healed with overlying scab, removal of this appears to show fully healed skin      Assessment:   1. Skin ulcer of right heel, limited to breakdown of skin ([place])   2. Skin ulcer of left heel, limited to breakdown of skin ([place])      Plan:  Patient was evaluated and treated and all questions answered.  She is doing better and the wounds have healed.  Additionally her ABIs show that she has good flow.  She will return to see me in 1 month for reevaluation to ensure that they remain healed we discussed continue to offload pressure from the ulcerations.  She will let me know if any drainage develops or if ulcerations recur No follow-ups on file.

## 2022-05-04 ENCOUNTER — Telehealth: Payer: Self-pay | Admitting: Endocrinology

## 2022-05-04 ENCOUNTER — Other Ambulatory Visit: Payer: Self-pay | Admitting: Internal Medicine

## 2022-05-04 MED ORDER — PREGABALIN 150 MG PO CAPS: 150.0000 mg | ORAL_CAPSULE | Freq: Two times a day (BID) | ORAL | 0 refills | Status: DC

## 2022-05-04 NOTE — Telephone Encounter (Signed)
Handling in a previous encounter

## 2022-05-04 NOTE — Telephone Encounter (Signed)
MEDICATION: pregabalin pregabalin (LYRICA) 150 MG capsule  PHARMACY:    [facility] #[id number] [address], [place] [address] (Ph: [phone])    HAS THE PATIENT CONTACTED THEIR PHARMACY?  Yes  IS THIS A 90 DAY SUPPLY : Yes  IS PATIENT OUT OF MEDICATION: No  IF NOT; HOW MUCH IS LEFT: Will be out Friday, 05/11/2022  LAST APPOINTMENT DATE: '@7'$ /04/2022  NEXT APPOINTMENT DATE:'@1'$ /02/2023  DO WE HAVE YOUR PERMISSION TO LEAVE A DETAILED MESSAGE?: Yes  OTHER COMMENTS: Patient states that she was told by [facility] that she will have to have a new prescription before she can get more medication.   **Let patient know to contact pharmacy at the end of the day to make sure medication is ready. **  ** Please notify patient to allow 48-72 hours to process**  **Encourage patient to contact the pharmacy for refills or they can request refills through [facility]**

## 2022-05-04 NOTE — Telephone Encounter (Signed)
I called patient states Dr Dwyane Dee prescribes for neuropathy. Pharmacy states that he is the last one to send in as well.

## 2022-05-04 NOTE — Telephone Encounter (Signed)
OK, I sent 1 mo

## 2022-05-04 NOTE — Telephone Encounter (Signed)
Exactly - let's check with the pt. because I do not have evidence that [facility] sent to this before.

## 2022-05-10 ENCOUNTER — Telehealth: Payer: Self-pay | Admitting: Family Medicine

## 2022-05-10 NOTE — Telephone Encounter (Signed)
[name] calling to ask the order just faxed to the office be signed and faxed back.  It is already a month out.  It was for PT.  Cb [phone] ext [phone]

## 2022-05-10 NOTE — Telephone Encounter (Signed)
Paperwork will be faxed once received.

## 2022-05-15 NOTE — Telephone Encounter (Signed)
Alma Friendly from [facility] would like a call for PT orders / please advise asap

## 2022-05-15 NOTE — Telephone Encounter (Signed)
Order has bene placed on PCP desk for signature.

## 2022-06-05 ENCOUNTER — Ambulatory Visit: Payer: Medicare HMO | Admitting: Podiatry

## 2022-06-05 DIAGNOSIS — R6889 Other general symptoms and signs: Secondary | ICD-10-CM | POA: Diagnosis not present

## 2022-06-05 DIAGNOSIS — L97421 Non-pressure chronic ulcer of left heel and midfoot limited to breakdown of skin: Secondary | ICD-10-CM | POA: Diagnosis not present

## 2022-06-05 DIAGNOSIS — L97411 Non-pressure chronic ulcer of right heel and midfoot limited to breakdown of skin: Secondary | ICD-10-CM | POA: Diagnosis not present

## 2022-06-07 NOTE — Progress Notes (Signed)
  Subjective:  Patient ID: [name], female    DOB: 1965/12/28,  MRN: [id number]  Chief Complaint  Patient presents with   Foot Ulcer    4 week follow up right heel    56 y.o. female presents with the above complaint. History confirmed with patient says it continues to do well  Objective:  Physical Exam: Foot is warm, non palpable DP/PT to my exam, bilateral heel ulcers have fully healed      Assessment:   1. Skin ulcer of right heel, limited to breakdown of skin ([place])   2. Skin ulcer of left heel, limited to breakdown of skin ([place])      Plan:  Patient was evaluated and treated and all questions answered.  Ulceration that fully healed she will monitor these closely for recurrence and return to see me as needed  Return if symptoms worsen or fail to improve.

## 2022-07-03 ENCOUNTER — Ambulatory Visit: Payer: Self-pay | Admitting: Endocrinology

## 2022-07-04 ENCOUNTER — Ambulatory Visit: Payer: Medicare HMO | Admitting: Endocrinology

## 2022-07-04 ENCOUNTER — Encounter: Payer: Self-pay | Admitting: Endocrinology

## 2022-07-04 VITALS — BP 124/80 | HR 74 | Ht 61.0 in | Wt 179.6 lb

## 2022-07-04 DIAGNOSIS — E1040 Type 1 diabetes mellitus with diabetic neuropathy, unspecified: Secondary | ICD-10-CM | POA: Diagnosis not present

## 2022-07-04 DIAGNOSIS — E1065 Type 1 diabetes mellitus with hyperglycemia: Secondary | ICD-10-CM | POA: Diagnosis not present

## 2022-07-04 DIAGNOSIS — E7800 Pure hypercholesterolemia, unspecified: Secondary | ICD-10-CM | POA: Diagnosis not present

## 2022-07-04 LAB — POCT GLYCOSYLATED HEMOGLOBIN (HGB A1C): Hemoglobin A1C: 10.7 % — AB (ref 4.0–5.6)

## 2022-07-04 LAB — BASIC METABOLIC PANEL
BUN: 21 mg/dL (ref 6–23)
CO2: 24 mEq/L (ref 19–32)
Calcium: 9.5 mg/dL (ref 8.4–10.5)
Chloride: 102 mEq/L (ref 96–112)
Creatinine, Ser: 1.19 mg/dL (ref 0.40–1.20)
GFR: 51.61 mL/min — ABNORMAL LOW (ref >60.00)
Glucose, Bld: 252 mg/dL — ABNORMAL HIGH (ref 70–99)
Potassium: 4.6 mEq/L (ref 3.5–5.1)
Sodium: 136 mEq/L (ref 135–145)

## 2022-07-04 LAB — LDL CHOLESTEROL, DIRECT: Direct LDL: 96 mg/dL

## 2022-07-04 MED ORDER — INSULIN ASPART 100 UNIT/ML IJ SOLN: INTRAMUSCULAR | 3 refills | Status: DC

## 2022-07-04 MED ORDER — PREGABALIN 150 MG PO CAPS: 150.0000 mg | ORAL_CAPSULE | Freq: Two times a day (BID) | ORAL | 3 refills | Status: DC

## 2022-07-04 NOTE — Patient Instructions (Addendum)
Lantus 9 units in pm  Miralax for bowels   Bolus before meals

## 2022-07-04 NOTE — Progress Notes (Addendum)
Patient ID: [name], female   DOB: Apr 22, 1966, 57 y.o.   MRN: [id number]           Reason for Appointment : Follow-up for Type 1 Diabetes  History of Present Illness          Diagnosis: Type 1 diabetes mellitus, date of diagnosis: 1977         Previous history:  She has mostly been followed by primary care physicians for her diabetes with consistently poor control She has had periodic episodes of ketoacidosis Previous insulin regimen:    OmniPod insulin pump since 07/07/2019, currently not using  Basal rates:  MN: 0.75, 10AM; 1.0, 10:30AM: 1.2, 4PM: 0.75, 7PM: 0.80,  I/C: 1, ISF: 30, timing 4 hours, target: MN: 120 with correction over 140  Recent history:   A1c is now 10.7 compared to 8.8  BASAL insulin: LANTUS 7 units twice a day MEALTIME insulin: Novolog 1: 15 carbohydrate coverage and correction doses  Current management, blood sugar patterns and problems identified:   She has not been seen since July She is not using the OmniPod pump since late last year because of cost Her Dexcom data was available for download This shows very poor control with only 24 % blood sugars within range  She does not appear to be getting enough home basal insulin especially overnight and also has periods of marked hyperglycemia postprandially in the evenings at times Blood sugars were much higher than before Also has persistently high readings overnight  Blood sugars are generally not controlled after dinner although blood sugars sometimes tend to rise more slowly based on her basal insulin action She does not take any extra insulin for relatively high fat foods although trying to avoid fried food  Previously did not have the ability to use the OmniPod 5 system because of her phone compatibility   Review of her 2-week Dexcom download shows the following recent analysis  She has only data for the last 3 days in the first week OVERNIGHT blood sugars are markedly increased and  consistently high through the night along with significant rise around 5-6 AM on Sundays Generally blood sugars are variably high after breakfast and lunch After dinner blood sugars generally dry significantly although gradually after about 6 PM into early part of the night Except for rare low normal readings No hypoglycemia On our average blood sugars are above the target range at all times Data for the last 2 weeks  CGM use % of time 64  2-week average/GV 230  Time in range 24  % Time Above 180 44  % Time above 250 32  % Time Below 70       Previously: GMI = 8.2  CGM use % of time 93  2-week average/GV 205  Time in range 40       %  % Time Above 180 34  % Time above 250 24  % Time Below 70 1    Symptoms of hypoglycemia: Weakness, sometimes none Treatment of hypoglycemia: Snacks         Self-care: The diet that the patient has been following is: None  Mealtimes are: Breakfast egg, cheese, sausage, occasionally grits, toast;   Lunch: Sandwich or only snacks, dinner: baked chricken, rice, potatoes                Dietician consultation: Most recent: years ago.         CDE consultation: 11/2021  Wt Readings from Last 3 Encounters:  07/04/22 179 lb 9.6 oz (81.5 kg)  01/07/22 176 lb 12.9 oz (80.2 kg)  01/02/22 180 lb (81.6 kg)   Diabetes labs:  Lab Results  Component Value Date   HGBA1C 10.7 (A) 07/04/2022   HGBA1C 8.8 (A) 01/02/2022   HGBA1C 10.0 (H) 10/03/2021   Lab Results  Component Value Date   MICROALBUR 7.5 (H) 01/02/2022   LDLCALC 105 (H) 08/09/2021   CREATININE 1.25 (H) 01/10/2022    Lab Results  Component Value Date   MICRALBCREAT 17.9 01/02/2022   MICRALBCREAT 4.3 06/13/2020     Allergies as of 07/04/2022       Reactions   Anesthetics, Amide Nausea And Vomiting   Chlorhexidine Other (See Comments)   Unknown reaction   Penicillins Diarrhea, Nausea And Vomiting, Other (See Comments)   Enkaid [encainide] Nausea And Vomiting   Subutex  [buprenorphine] Rash        Medication List        Accurate as of July 04, 2022  4:24 PM. If you have any questions, ask your nurse or doctor.          STOP taking these medications    Insulin Degludec 100 UNIT/ML Soln Commonly known as: [place] Stopped by: Elayne Snare, MD       TAKE these medications    acetaminophen 500 MG tablet Commonly known as: TYLENOL Take 1,000 mg by mouth at bedtime as needed for mild pain.   aspirin EC 81 MG tablet Take 1 tablet (81 mg total) by mouth daily.   BD Insulin Syringe U/F 31G X 5/16" 0.5 ML Misc Generic drug: Insulin Syringe-Needle U-100 USE FOUR TIMES DAILY FOR INSULIN   busPIRone 10 MG tablet Commonly known as: BUSPAR Take 1 tablet (10 mg total) by mouth 3 (three) times daily.   doxycycline 100 MG tablet Commonly known as: VIBRA-TABS Take 1 tablet (100 mg total) by mouth 2 (two) times daily.   ferrous sulfate 325 (65 FE) MG tablet Take 1 tablet (325 mg total) by mouth daily with breakfast.   furosemide 40 MG tablet Commonly known as: LASIX Take 1 tablet (40 mg total) by mouth daily.   insulin aspart 100 UNIT/ML injection Commonly known as: NovoLOG Inject 7 Units into the skin 3 (three) times daily before meals. What changed:  how much to take additional instructions   insulin aspart 100 UNIT/ML injection Commonly known as: novoLOG 100 units in pump daily What changed: additional instructions Changed by: Elayne Snare, MD   Insulin Syringes (Disposable) U-100 0.3 ML Misc Use to draw up insulin as instructed   Lantus 100 UNIT/ML injection Generic drug: insulin glargine Inject 7 Units into the skin 2 (two) times daily.   losartan 25 MG tablet Commonly known as: COZAAR Take 1 tablet (25 mg total) by mouth daily. Hold this medication if your systolic blood pressure ( top number) s less than 100. Please call your doctor is your systolic blood pressure is less than 100   metoCLOPramide 10 MG tablet Commonly  known as: REGLAN TAKE 1 TABLET(10 MG) BY MOUTH THREE TIMES DAILY BEFORE MEALS   metoprolol tartrate 25 MG tablet Commonly known as: LOPRESSOR Take 0.5 tablets (12.5 mg total) by mouth 2 (two) times daily. Please check your blood pressure twice a day, please hold this medication if your systolic blood pressure ( top number) is less than 100, please call your doctor if your systolic blood pressure is less than 100.   mupirocin ointment 2 % Commonly known as:  BACTROBAN Apply 1 Application topically 2 (two) times daily.   Omnipod DASH Pods (Gen 4) Misc SUB-Q Every Other Day   ondansetron 4 MG tablet Commonly known as: Zofran Take 1 tablet (4 mg total) by mouth every 8 (eight) hours as needed for nausea or vomiting.   pregabalin 150 MG capsule Commonly known as: LYRICA Take 1 capsule (150 mg total) by mouth 2 (two) times daily.   rosuvastatin 20 MG tablet Commonly known as: CRESTOR Take 1 tablet (20 mg total) by mouth daily.   silver sulfADIAZINE 1 % cream Commonly known as: Silvadene Apply 1 Application topically daily.        Allergies:  Allergies  Allergen Reactions   Anesthetics, Amide Nausea And Vomiting   Chlorhexidine Other (See Comments)    Unknown reaction   Penicillins Diarrhea, Nausea And Vomiting and Other (See Comments)   Enkaid [Encainide] Nausea And Vomiting   Subutex [Buprenorphine] Rash    Past Medical History:  Diagnosis Date   AKI (acute kidney injury) ([place])    Anemia, iron deficiency    Anxiety and depression 05/18/2015   CAD (coronary artery disease), native coronary artery 01/11/2018   s/p CABG - Non-STEMI with severe three-vessel disease noted July 2019   Depression    Diabetic gastroparesis ([facility])    Diabetic neuropathy, type I diabetes mellitus ([place]) 05/18/2015   DKA, type 1 ([place]) 03/18/2016   Essential hypertension    Gastroparesis    GERD (gastroesophageal reflux disease)    HLD (hyperlipidemia)    MDD (major depressive disorder), single  episode, severe , no psychosis ([place])    Tardive dyskinesia    Vitamin B12 deficiency 08/16/2015    Past Surgical History:  Procedure Laterality Date   CARDIAC SURGERY     COLONOSCOPY  09/27/2014   at [place] N/A 01/14/2018   Procedure: CORONARY ARTERY BYPASS GRAFTING (CABG)X3, RIGHT AND LEFT SAPHENOUS VEIN HARVEST, MAMMARY TAKE DOWN. MAMMARY TO LAD, SVG TO PD, SVG TO DISTAL CIRC.;  Surgeon: Grace Isaac, MD;  Location: [facility];  Service: Open Heart Surgery;  Laterality: N/A;   ESOPHAGOGASTRODUODENOSCOPY  09/27/2014   at [facility], Dr Rolan Lipa. biospy neg for celiac, neg for H pylori.    EYE SURGERY     gailstones     INTRAMEDULLARY (IM) NAIL INTERTROCHANTERIC Right 05/10/2019   Procedure: INTRAMEDULLARY (IM) NAIL INTERTROCHANTRIC;  Surgeon: Lovell Sheehan, MD;  Location: [facility];  Service: Orthopedics;  Laterality: Right;   IR FLUORO GUIDE CV LINE RIGHT  02/01/2017   IR FLUORO GUIDE CV LINE RIGHT  03/06/2017   IR FLUORO GUIDE CV LINE RIGHT  03/25/2017   IR GENERIC HISTORICAL  01/24/2016   IR FLUORO GUIDE CV LINE RIGHT 01/24/2016 Darrell K Allred, PA-C [facility]   IR GENERIC HISTORICAL  01/24/2016   IR US GUIDE VASC ACCESS RIGHT 01/24/2016 Darrell K Allred, PA-C [facility]   IR US GUIDE VASC ACCESS RIGHT  02/01/2017   IR US GUIDE VASC ACCESS RIGHT  03/06/2017   IR US GUIDE VASC ACCESS RIGHT  03/25/2017   IR US GUIDE VASC ACCESS RIGHT  01/20/2019   IR VENIPUNCTURE 19YRS/OLDER BY MD  01/20/2019   LEFT HEART CATH AND CORONARY ANGIOGRAPHY N/A 01/07/2018   Procedure: LEFT HEART CATH AND CORONARY ANGIOGRAPHY;  Surgeon: Troy Sine, MD;  Location: [facility];  Service: Cardiovascular;  Laterality: N/A;   POSTERIOR VITRECTOMY AND MEMBRANE PEEL-LEFT EYE  09/28/2002   POSTERIOR  VITRECTOMY AND MEMBRANE PEEL-RIGHT EYE  03/16/2002   RETINAL DETACHMENT SURGERY     TEE WITHOUT CARDIOVERSION N/A 01/14/2018   Procedure: TRANSESOPHAGEAL ECHOCARDIOGRAM (TEE);  Surgeon:  Grace Isaac, MD;  Location: [facility];  Service: Open Heart Surgery;  Laterality: N/A;    Family History  Problem Relation Age of Onset   Cystic fibrosis Mother    Hypertension Father    Diabetes Brother    Hypertension Maternal Grandmother     Social History:  reports that she has never smoked. She has never used smokeless tobacco. She reports current drug use. Drug: Marijuana. She reports that she does not drink alcohol.      Review of Systems      Lipids: She had been taking Crestor 10 mg prescribed by cardiologist   Lab Results  Component Value Date   CHOL 183 08/09/2021   HDL 57.50 08/09/2021   LDLCALC 105 (H) 08/09/2021   TRIG 104.0 08/09/2021   CHOLHDL 3 08/09/2021     DIABETES COMPLICATIONS: Gastroparesis treated with Zofran as needed Also on Reglan with good relief of symptoms   On Lyrica 150 mg for peripheral neuropathy with pain generally relieved by the Lyrica Recently ran out and is having more symptoms Also having some paresthesias in her hands at times especially left   Physical Examination:  BP 124/80   Pulse 74   Ht '5\' 1"'$  (1.549 m)   Wt 179 lb 9.6 oz (81.5 kg)   SpO2 98%   BMI 33.94 kg/m    Tinel's sign weakly positive on the left  ASSESSMENT:  Diabetes type 1, long-standing with multiple complications  She is currently on her OmniPod pump  See history of present illness for detailed discussion of current diabetes management, blood sugar patterns and problems identified  Her A1c is now 10.7 and usually better than that  Blood sugars from her Dexcom were reviewed With not using the insulin pump and not adjusting her insulin and of her blood sugars are quite out-of-control Only in the last 8 blood sugars are somewhat better However she generally does not appear to have adequate postprandial control and may be related to underestimating her carbohydrates or relatively higher fat foods such as cheese Also timing of Premeal insulin may  not be consistent As above he appears to be needing more insulin overnight although occasionally her early morning readings have been better She does need a higher correction factor when using her injections  Peripheral neuropathy: Appears to be symptomatic in her hands also now  Lipids: She has been treated by her cardiologist but is overdue for her labs and will check today  PLAN:   Since it appears that she has a capability of integrating her OmniPod 5 system with her smart phone and the Dexcom apps that are required will have her start this instead of going back to the OmniPod version #4 She will be instructed by the diabetes educator and for now we will maintain the same settings In the meantime needs to increase her Lantus to at least 9 units in the evening to get her fasting blood sugars at least below 150  Check labs today  She does need to discuss her paresthesia in her hands with her PCP as she may have an element of carpal tunnel syndrome Lyrica refill  For constipation can use MiraLAX instead of Metamucil    Patient Instructions  Lantus 9 units in pm  Miralax for bowels   Bolus before meals  Elayne Snare 07/04/2022, 4:24 PM   Total visit time including counseling on diabetes management and use of the insulin pump = 30 minutes  Note: This note was prepared with Dragon voice recognition system technology. Any transcriptional errors that result from this process are unintentional.

## 2022-07-06 ENCOUNTER — Other Ambulatory Visit: Payer: Self-pay

## 2022-07-06 DIAGNOSIS — E1040 Type 1 diabetes mellitus with diabetic neuropathy, unspecified: Secondary | ICD-10-CM

## 2022-07-06 MED ORDER — PREGABALIN 150 MG PO CAPS: 150.0000 mg | ORAL_CAPSULE | Freq: Two times a day (BID) | ORAL | 3 refills | Status: DC

## 2022-07-12 ENCOUNTER — Encounter: Payer: Self-pay | Admitting: Endocrinology

## 2022-07-20 IMAGING — DX DG CHEST 1V PORT
1 series · 1 of 1 positions shown · non-contrast
Comparison: March 09, 2021

CLINICAL DATA: Hyperglycemia.

EXAM:
PORTABLE CHEST 1 VIEW

[chest ap]
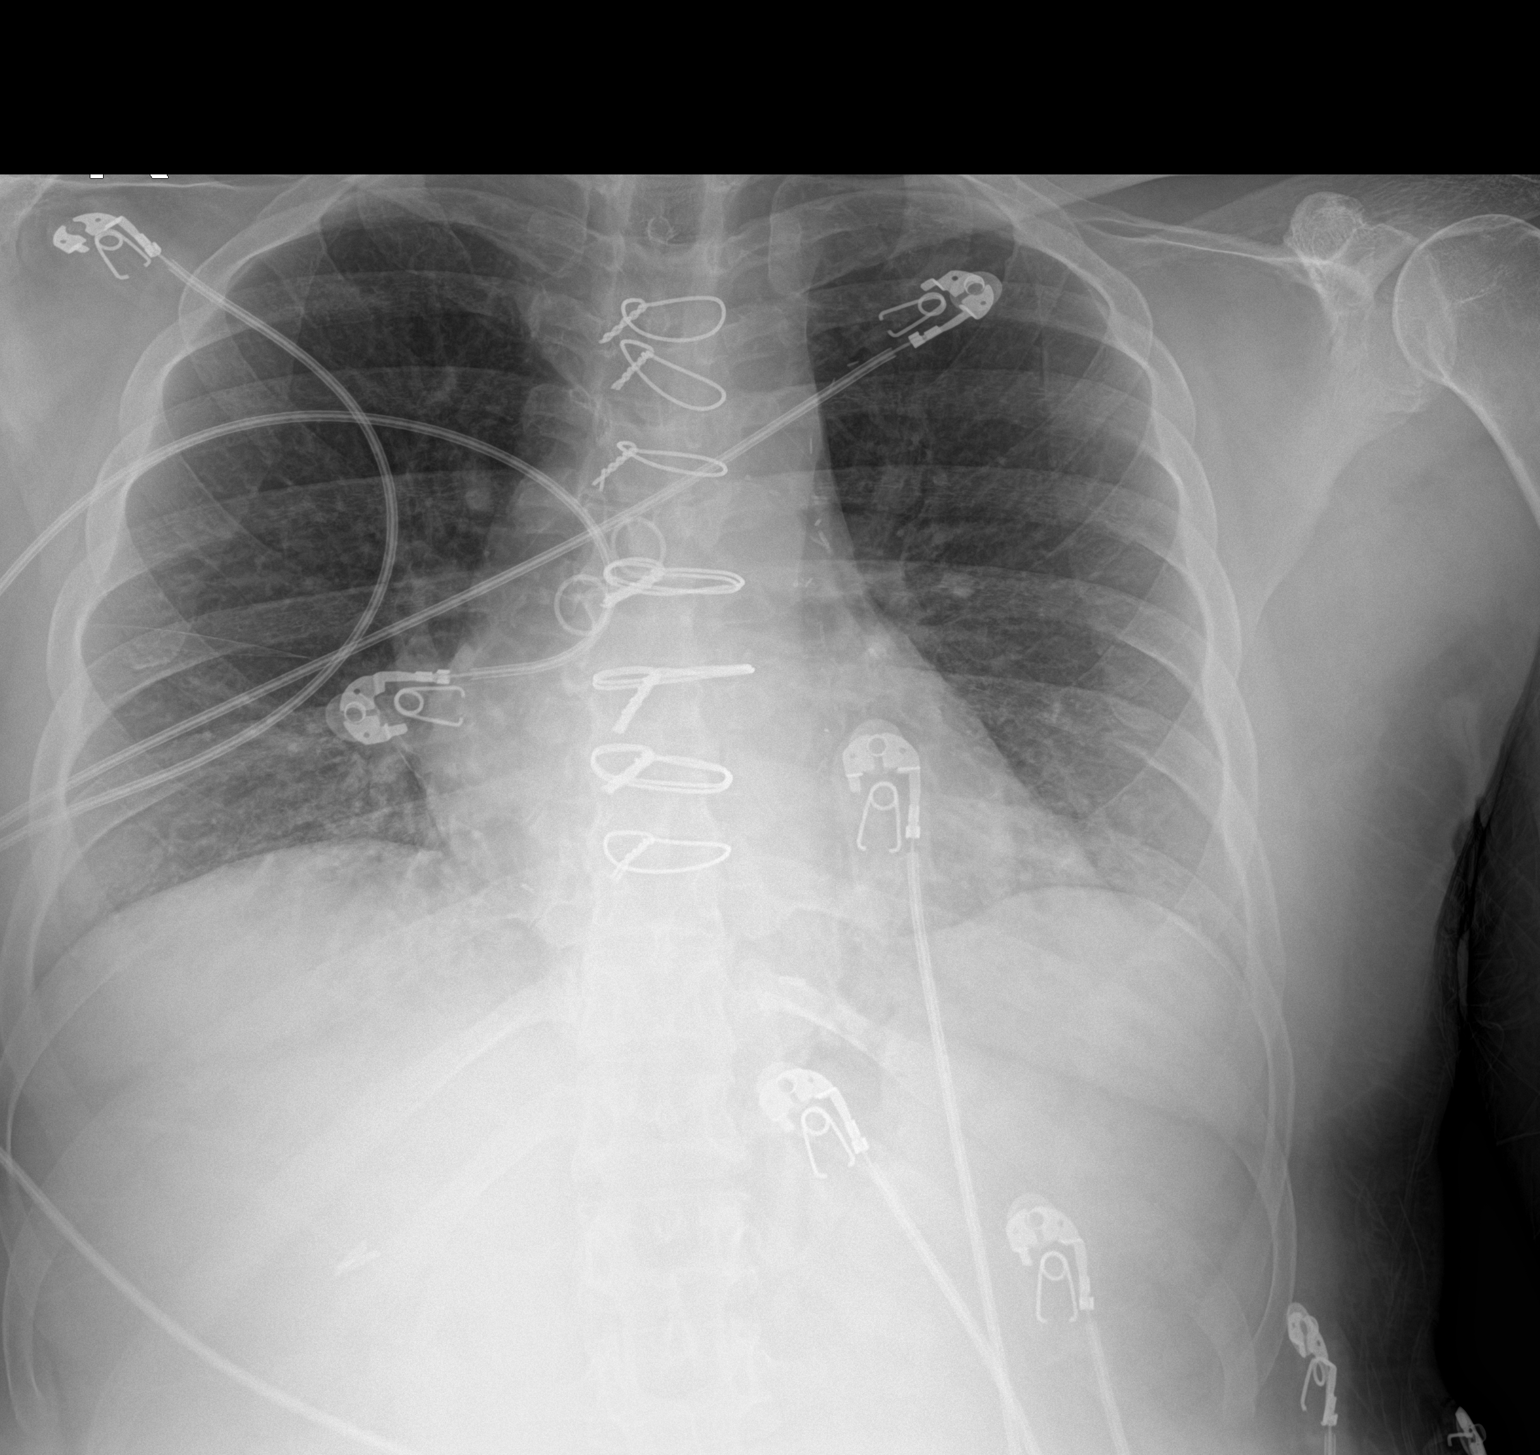

[1 of 1 positions shown; findings below may reference images not displayed]

FINDINGS: Multiple sternal wires and vascular clips are noted. Mildly
decreased lung volumes are seen with mild atelectasis is present
within the bilateral lung bases. There is no evidence of a pleural
effusion or pneumothorax. The heart size and mediastinal contours
are within normal limits. The visualized skeletal structures are
unremarkable.
IMPRESSION: 1. Findings consistent with prior median sternotomy/CABG.
2. Mild bibasilar atelectasis.

## 2022-07-20 IMAGING — CT CT HEAD W/O CM
4 series · 16 of 47 positions shown, 18 images · non-contrast
Comparison: CT head dated November 12, 2020

CLINICAL DATA: Head trauma, abnormal mental status.



[Series 2: head without · axial · non-contrast · 0.45mm/px · z∈[-84,+36]mm · 7 of 34 slices shown, 9 images]
[im 5/34  brain]
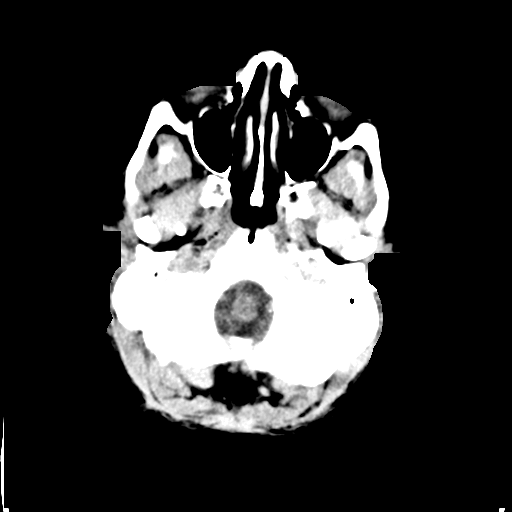
[im 5/34  bone]
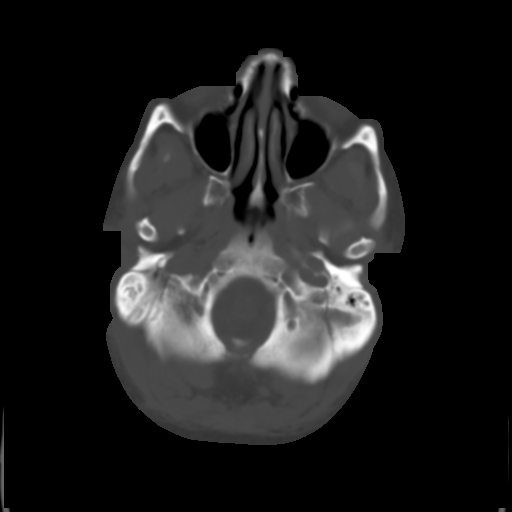
[im 9/34  brain]
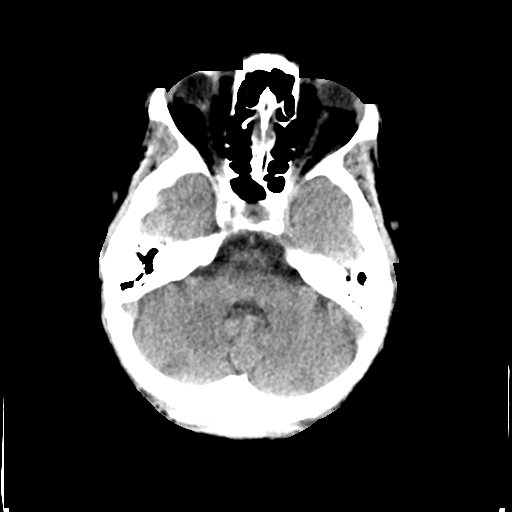
[im 13/34  brain]
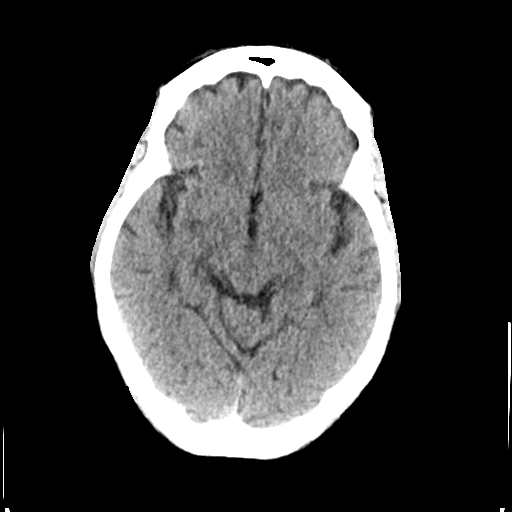
[im 17/34  brain]
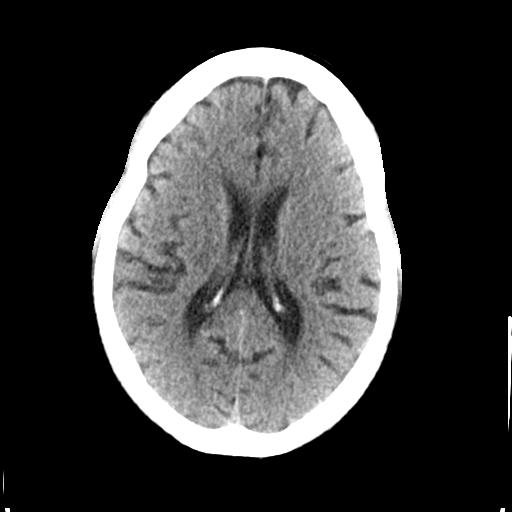
[im 21/34  brain]
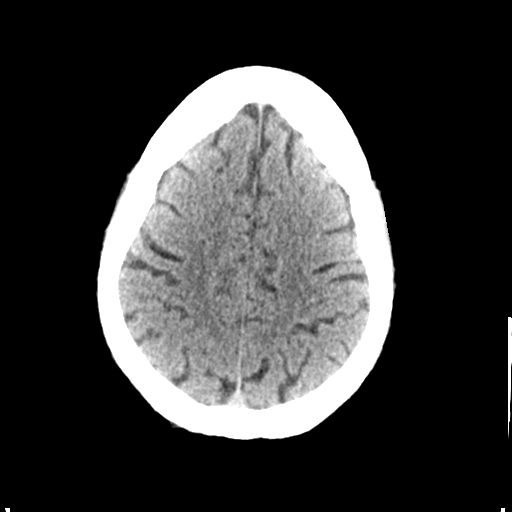
[im 21/34  bone]
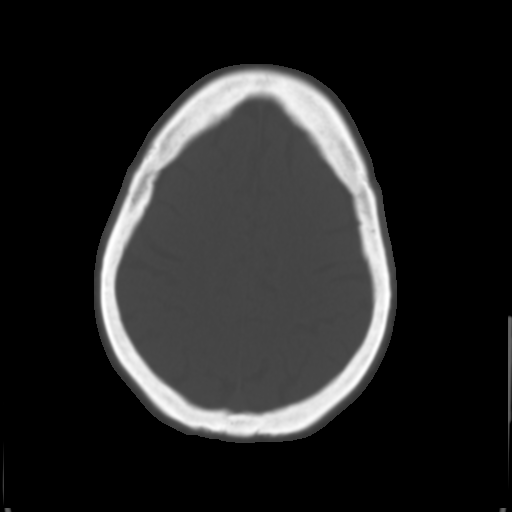
[im 25/34  brain]
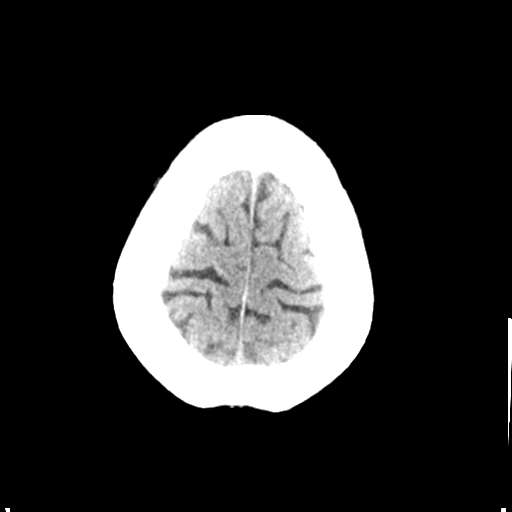
[im 29/34  brain]
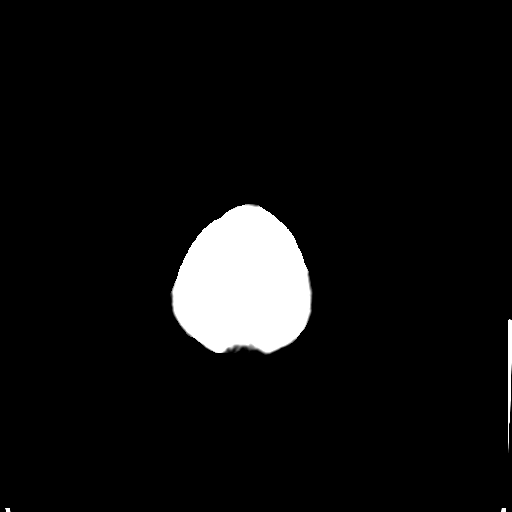

[Series 3: head bone · axial · 0.45mm/px · z∈[-88,-56]mm · 3 of 84 slices shown]
[im 9/84  bone]
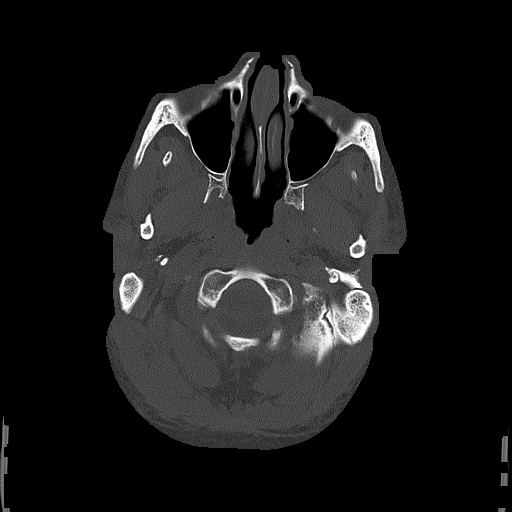
[im 17/84  bone]
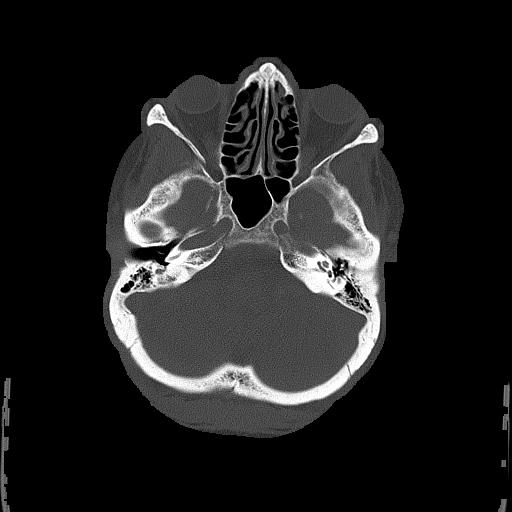
[im 25/84  bone]
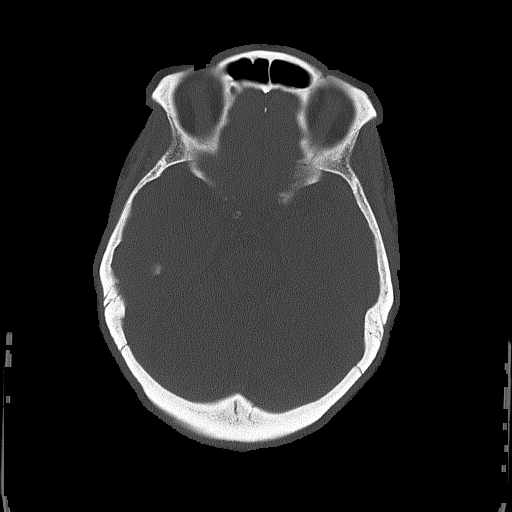

[Series 4: head without cor · coronal · non-contrast · 0.32mm/px · 3 of 72 slices shown]
[im 24/72  brain]
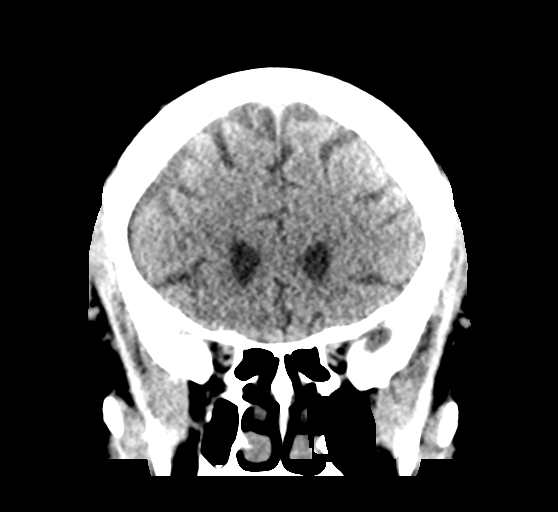
[im 32/72  brain]
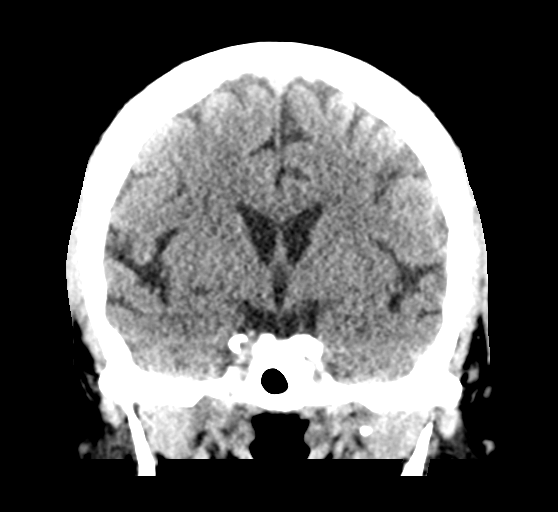
[im 40/72  brain]
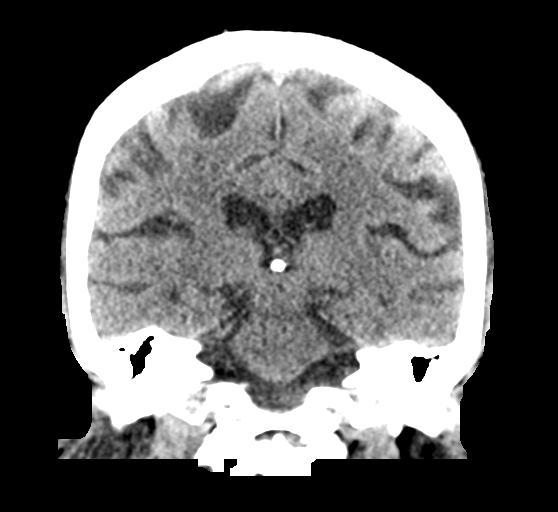

[Series 5: head without sag · sagittal · non-contrast · 0.34mm/px · 3 of 58 slices shown]
[im 20/58  brain]
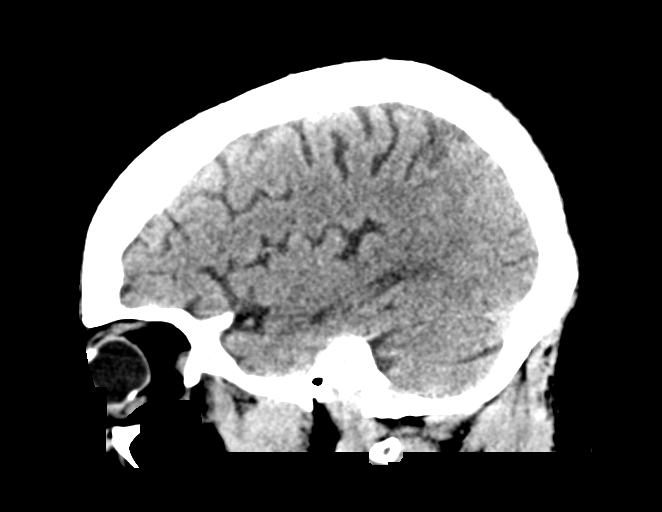
[im 29/58  brain]
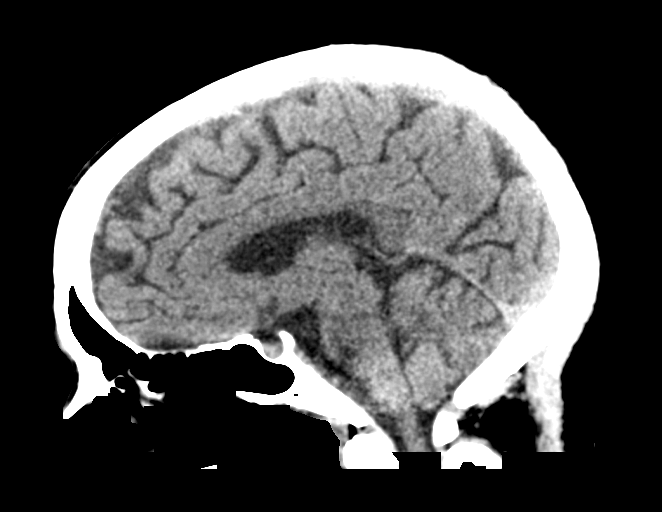
[im 39/58  brain]
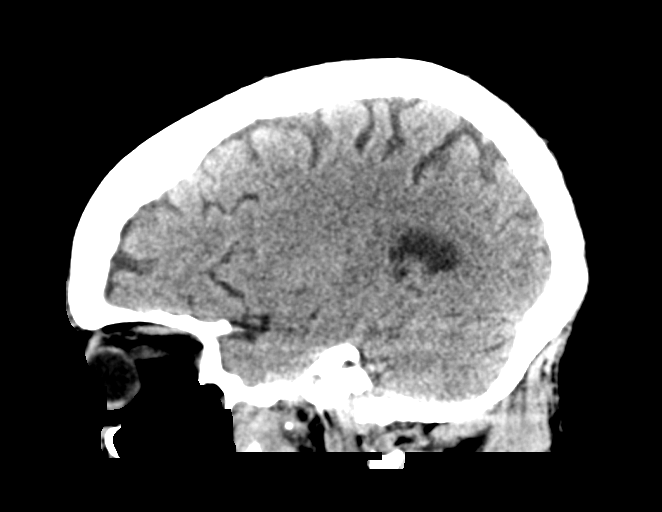

[16 of 47 positions shown; findings below may reference images not displayed]

FINDINGS: Brain: No evidence of acute infarction, hemorrhage, hydrocephalus,
extra-axial collection or mass lesion/mass effect.

Vascular: No hyperdense vessel or unexpected calcification.

Skull: Normal. Negative for fracture or focal lesion.

Sinuses/Orbits: No acute finding.

Other: None.
IMPRESSION: No CT evidence of acute intracranial abnormality.

## 2022-07-21 IMAGING — CT CT CERVICAL SPINE W/O CM
4 of 5 series · 13 of 33 positions shown, 15 images · non-contrast
Comparison: None.

CLINICAL DATA: Neck pain, found down at home.



[Series 7: c_spine 2.0 orthogonals · axial · 0.21mm/px · z∈[-356,-273]mm · 3 of 89 slices shown, 4 images (1 of 2)]
[im 23/89  soft-tissue]
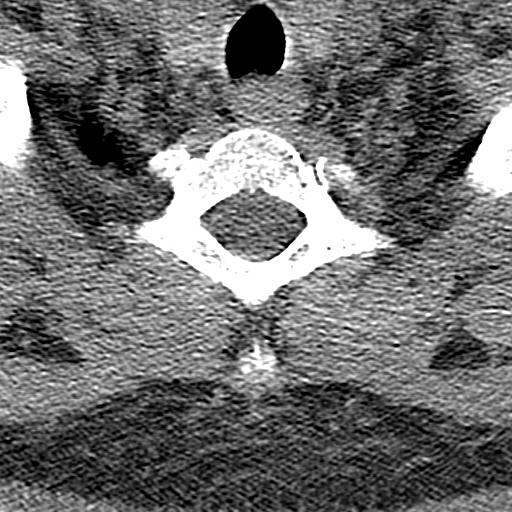
[im 23/89  bone]
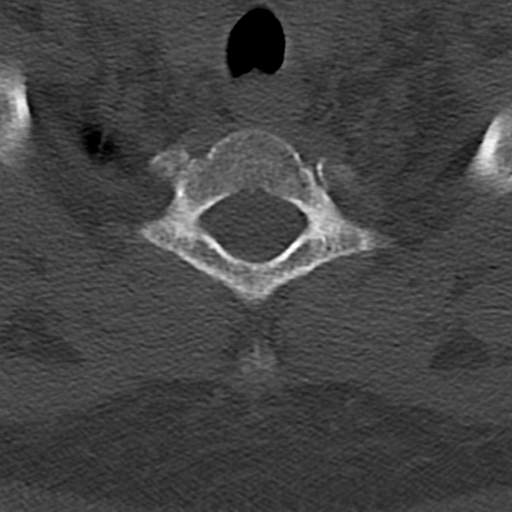
[im 45/89  bone]
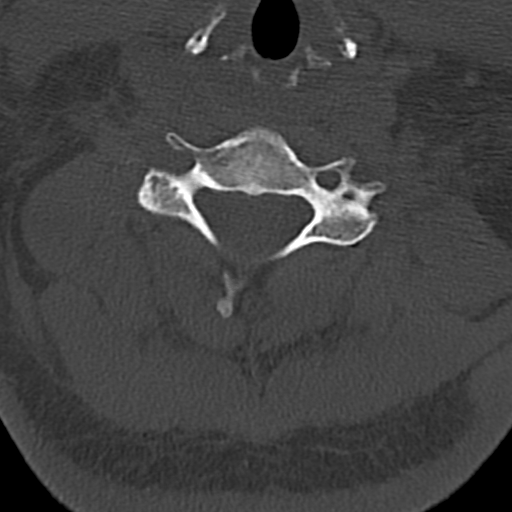
[im 67/89  bone]
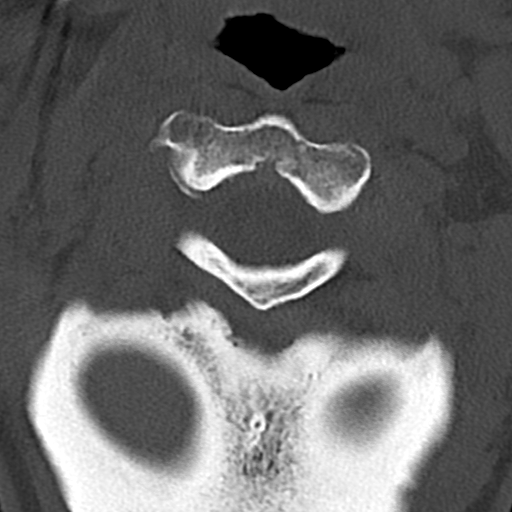

[Series 8: c_spine 2.0 orthogonals · axial · 0.21mm/px · z∈[-356,-316]mm · 2 of 89 slices shown (2 of 2)]
[im 23/89  bone]
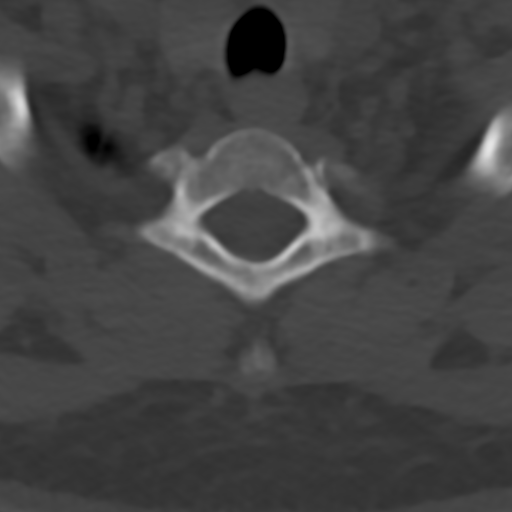
[im 45/89  bone]
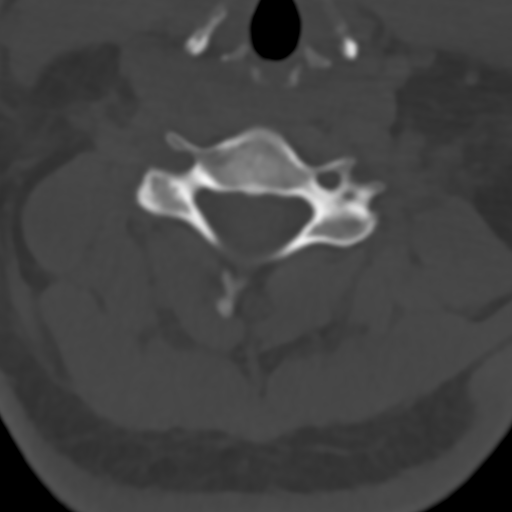

[Series 9: c_spine 2.0 sag bone · sagittal · 0.30mm/px · 5 of 61 slices shown, 6 images]
[im 21/61  bone]
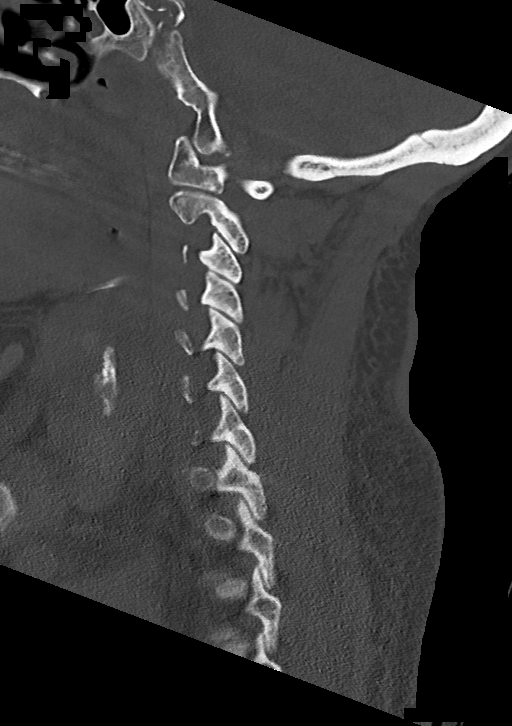
[im 26/61  bone]
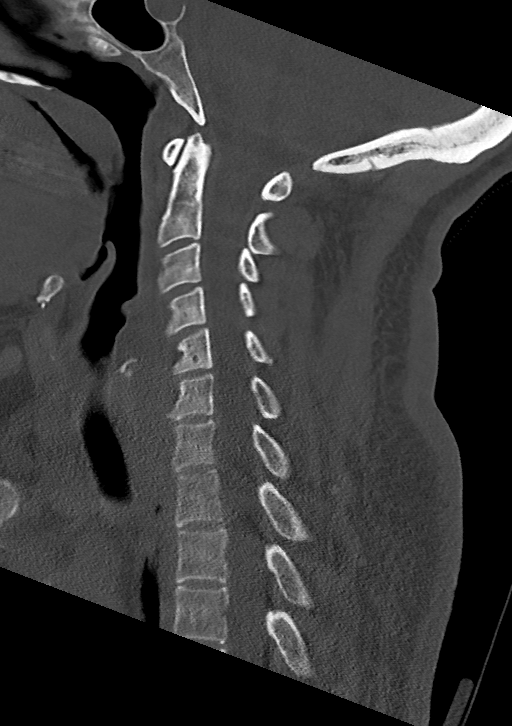
[im 31/61  soft-tissue]
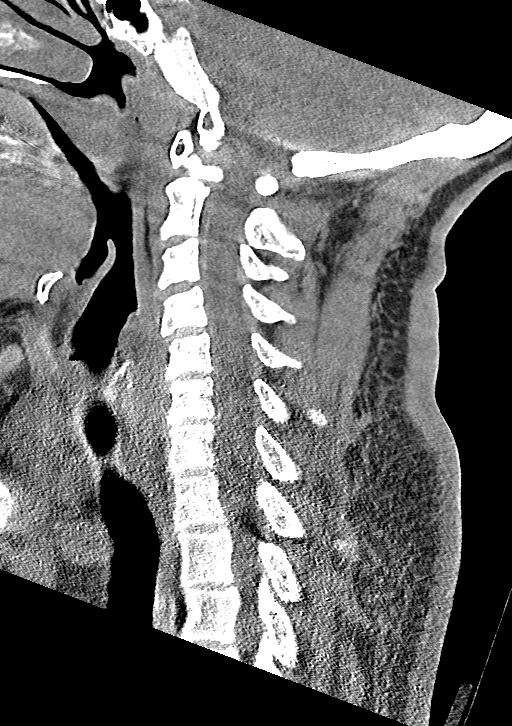
[im 31/61  bone]
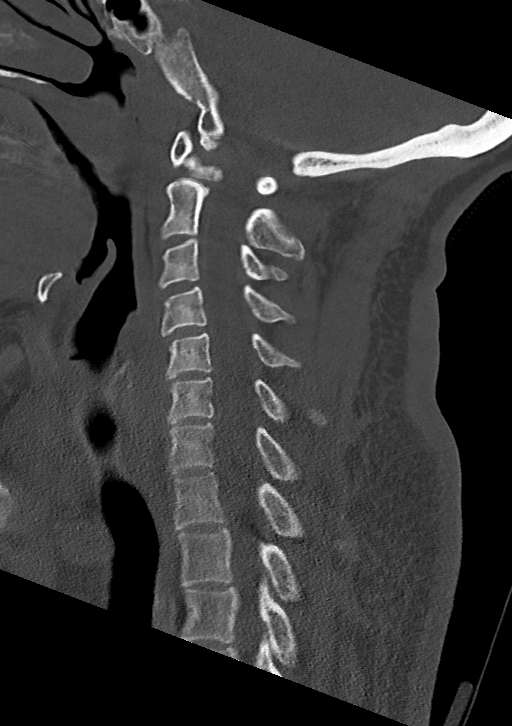
[im 36/61  bone]
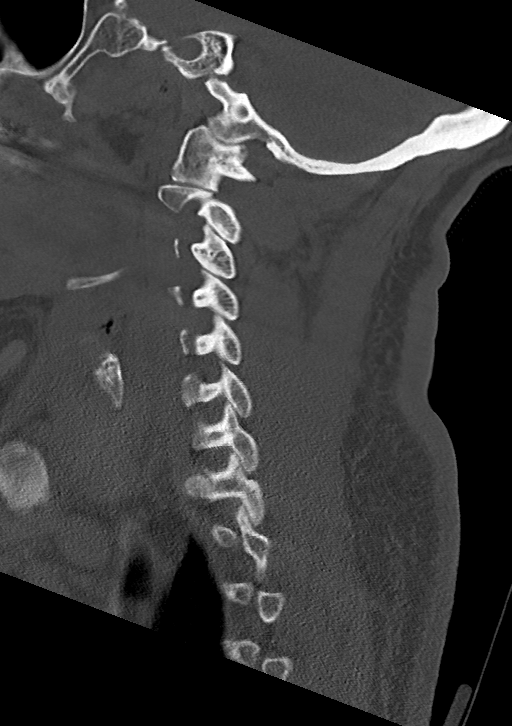
[im 41/61  bone]
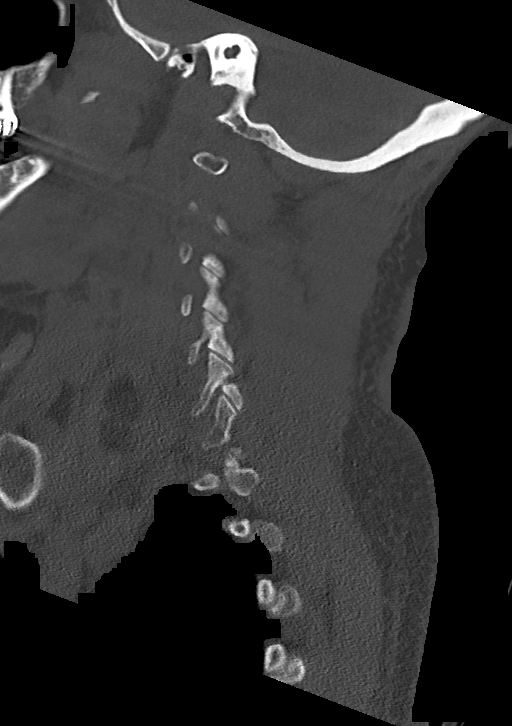

[Series 10: c_spine 2.0 cor bone · coronal · 0.26mm/px · 3 of 62 slices shown]
[im 13/62  bone]
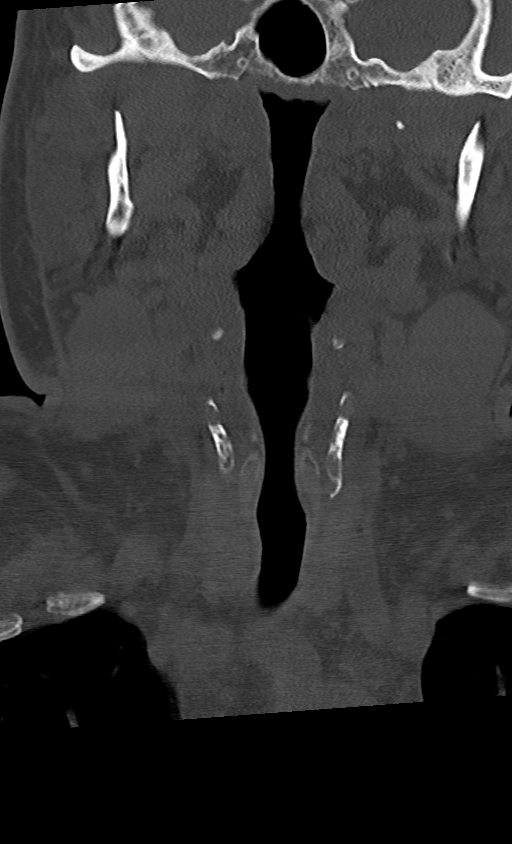
[im 25/62  bone]
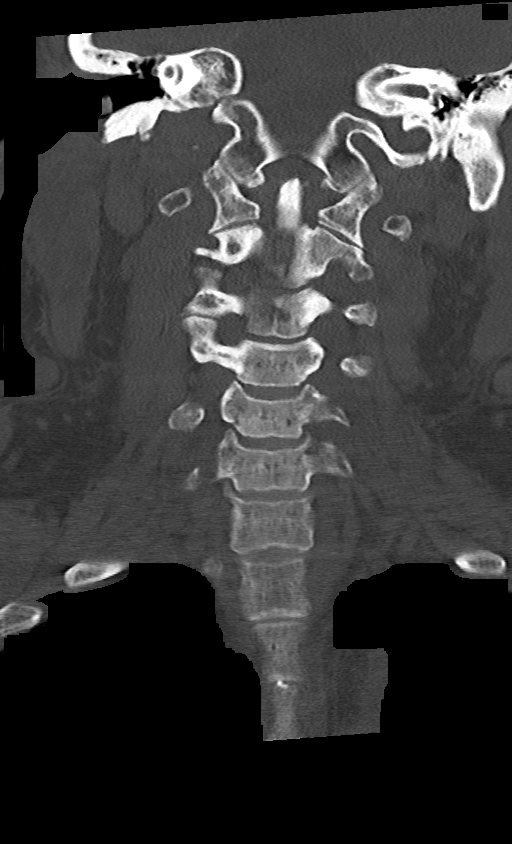
[im 37/62  bone]
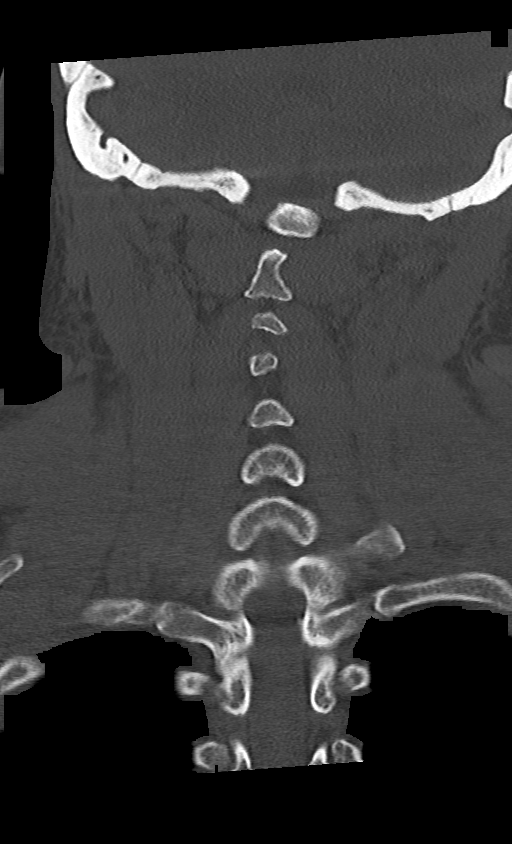

[13 of 33 positions shown; findings below may reference images not displayed]

FINDINGS: Alignment: Normal.

Skull base and vertebrae: No acute fracture. No primary bone lesion
or focal pathologic process.

Soft tissues and spinal canal: No prevertebral fluid or swelling. No
visible canal hematoma.

Disc levels: Intervertebral disc space is maintained. No significant
spinal canal or neural foraminal stenosis.

Upper chest: Negative.

Other: None.
IMPRESSION: No acute fracture or subluxation.

## 2022-08-08 ENCOUNTER — Other Ambulatory Visit: Payer: Self-pay

## 2022-09-12 ENCOUNTER — Ambulatory Visit: Payer: Medicare HMO | Admitting: Podiatry

## 2022-09-24 DIAGNOSIS — R110 Nausea: Secondary | ICD-10-CM | POA: Diagnosis not present

## 2022-09-24 DIAGNOSIS — K5904 Chronic idiopathic constipation: Secondary | ICD-10-CM | POA: Diagnosis not present

## 2022-09-24 DIAGNOSIS — K3184 Gastroparesis: Secondary | ICD-10-CM | POA: Diagnosis not present

## 2022-09-24 DIAGNOSIS — R6889 Other general symptoms and signs: Secondary | ICD-10-CM | POA: Diagnosis not present

## 2022-09-24 DIAGNOSIS — K30 Functional dyspepsia: Secondary | ICD-10-CM | POA: Diagnosis not present

## 2022-09-24 DIAGNOSIS — Z1211 Encounter for screening for malignant neoplasm of colon: Secondary | ICD-10-CM | POA: Diagnosis not present

## 2022-10-01 ENCOUNTER — Telehealth: Payer: Self-pay

## 2022-10-01 NOTE — Telephone Encounter (Signed)
Inbound email requesting signature for resupply order. Order e-signed and forwarded to DME supplier.

## 2022-10-10 ENCOUNTER — Ambulatory Visit: Payer: Medicare HMO | Admitting: Endocrinology

## 2022-10-31 ENCOUNTER — Encounter: Payer: Self-pay | Admitting: Family Medicine

## 2022-10-31 ENCOUNTER — Ambulatory Visit: Payer: Medicare HMO | Attending: Family Medicine | Admitting: Family Medicine

## 2022-10-31 ENCOUNTER — Other Ambulatory Visit (HOSPITAL_COMMUNITY)
Admission: RE | Admit: 2022-10-31 | Discharge: 2022-10-31 | Disposition: A | Discharge status: Home / Self Care | Payer: Medicare HMO | Source: Ambulatory Visit | Attending: Family Medicine | Admitting: Family Medicine

## 2022-10-31 VITALS — BP 133/70 | HR 70 | Ht 61.0 in | Wt 177.8 lb

## 2022-10-31 DIAGNOSIS — M7502 Adhesive capsulitis of left shoulder: Secondary | ICD-10-CM | POA: Diagnosis not present

## 2022-10-31 DIAGNOSIS — E1065 Type 1 diabetes mellitus with hyperglycemia: Secondary | ICD-10-CM

## 2022-10-31 DIAGNOSIS — R399 Unspecified symptoms and signs involving the genitourinary system: Secondary | ICD-10-CM | POA: Diagnosis not present

## 2022-10-31 DIAGNOSIS — B3731 Acute candidiasis of vulva and vagina: Secondary | ICD-10-CM

## 2022-10-31 DIAGNOSIS — Z794 Long term (current) use of insulin: Secondary | ICD-10-CM | POA: Diagnosis not present

## 2022-10-31 DIAGNOSIS — R6889 Other general symptoms and signs: Secondary | ICD-10-CM | POA: Diagnosis not present

## 2022-10-31 DIAGNOSIS — I2510 Atherosclerotic heart disease of native coronary artery without angina pectoris: Secondary | ICD-10-CM | POA: Diagnosis not present

## 2022-10-31 DIAGNOSIS — N3001 Acute cystitis with hematuria: Secondary | ICD-10-CM

## 2022-10-31 DIAGNOSIS — R110 Nausea: Secondary | ICD-10-CM | POA: Diagnosis not present

## 2022-10-31 DIAGNOSIS — Z1231 Encounter for screening mammogram for malignant neoplasm of breast: Secondary | ICD-10-CM

## 2022-10-31 LAB — POCT URINALYSIS DIP (CLINITEK)
Bilirubin, UA: NEGATIVE
Glucose, UA: 500 mg/dL — AB
Ketones, POC UA: NEGATIVE mg/dL
Nitrite, UA: POSITIVE — AB
POC PROTEIN,UA: >=300 — AB
Spec Grav, UA: 1.025 (ref 1.010–1.025)
Urobilinogen, UA: 1 E.U./dL
pH, UA: 6 (ref 5.0–8.0)

## 2022-10-31 MED ORDER — FLUCONAZOLE 150 MG PO TABS
150.0000 mg | ORAL_TABLET | Freq: Once | ORAL | 0 refills | Status: AC
Start: 2022-10-31 — End: 2022-10-31

## 2022-10-31 MED ORDER — ONDANSETRON HCL 4 MG PO TABS
4.0000 mg | ORAL_TABLET | Freq: Three times a day (TID) | ORAL | 1 refills | Status: DC | PRN
Start: 2022-10-31 — End: 2024-02-29

## 2022-10-31 NOTE — Patient Instructions (Signed)
Adhesive Capsulitis  Adhesive capsulitis, also called frozen shoulder, causes the shoulder to become stiff and painful to move. This condition happens when there is inflammation of the tendons and ligaments that surround the shoulder joint (shoulder capsule). Tendons are tissues that connect muscle to bone. Ligaments are tissues that connect bones to each other. What are the causes? This condition may be caused by: An injury to your shoulder joint. Straining your shoulder. Not moving your shoulder for a period of time. This can happen if your arm was injured or in a sling. In some cases, the cause is not known. What increases the risk? You are more likely to develop this condition if: You are female. You are older than 56 years of age. You have certain other conditions, such as: Diabetes. Thyroid problems. Stroke. You recently had surgery, especially shoulder or neck surgery. What are the signs or symptoms? Symptoms of this condition include: Pain in your shoulder when you move your arm. There may also be pain when parts of your shoulder are touched. The pain may be worse at night or when you are resting. Not being able to move your shoulder normally. Sudden muscle tightening (muscle spasms). How is this diagnosed? This condition is diagnosed with a physical exam and imaging tests, such as an X-ray or MRI. How is this treated? This condition may be treated with: Treatment of the injury or condition that caused the adhesive capsulitis. Medicines for pain, inflammation, or muscle spasms. Injections of medicine (steroids) into the shoulder joint. Physical therapy. This involves doing exercises to get the shoulder moving again. Shoulder manipulation. This is a procedure to move the shoulder into another position. It is done after you are given a medicine to make you fall asleep (general anesthesia). The joint may also be injected with salt water at high pressure to break down  scarring. Surgery. This may be done in severe cases when other treatments do not work. Some less common treatments include: Injection of hyaluronic acid into the shoulder joint. This substance is a normal part of the fluid inside joints. It helps lubricate the joint and can lower inflammation. Injection of platelet-rich plasma into the shoulder joint. This uses a type of blood cell from your own blood that may speed up the healing process. Sending energy waves to the affected area (extracorporeal shock wave therapy). Most people recover completely from adhesive capsulitis, but some may not get back full shoulder movement. Follow these instructions at home: Managing pain, stiffness, and swelling     If told, put ice on the injured area. Put ice in a plastic bag. Place a towel between your skin and the bag. Leave the ice on for 20 minutes, 2-3 times a day. If told, apply heat to the affected area before you exercise. Use the heat source that your health care provider recommends, such as a moist heat pack or a heating pad. Place a towel between your skin and the heat source. Leave the heat on for 20-30 minutes. If your skin turns bright red, remove the ice or heat right away to prevent skin damage. The risk of damage is higher if you cannot feel pain, heat, or cold. General instructions Take over-the-counter and prescription medicines only as told by your provider. If you are being treated with physical therapy, do exercises as told. Avoid activities or exercises that put a lot of demand on your shoulder, such as throwing. These can make pain worse. Contact a health care provider if: You have   new symptoms. Your symptoms get worse. This information is not intended to replace advice given to you by your health care provider. Make sure you discuss any questions you have with your health care provider. Document Revised: 03/26/2022 Document Reviewed: 03/26/2022 Elsevier Patient Education  2023  Elsevier Inc.  

## 2022-10-31 NOTE — Progress Notes (Signed)
Subjective:  Patient ID: [name], female    DOB: 1966/03/21  Age: 57 y.o. MRN: [id number]  CC: Urinary Tract Infection   HPI [name] is a 57 y.o. year old female with a history of Type 1 diabetes mellitus (A1c 10.7 managed by endocrine), diabetic neuropathy, diabetic gastroparesis, hypertension, anemia, anxiety and depression, multiple ED visits and hospitalizations for diabetic gastroparesis, CAD s/p CABG x3 (LIMA to LAD, SVG to PDA, SVG to OM) in 12/2017, R femoral fracture (s/p intramedullary nail in 04/2019).   Interval History:  Her left shoulder has been hurting x2 weeks with associated decreased  ROM.  Pain radiates to her left upper back.  She also has slight limitation in range of motion of her right shoulder but this is not as bad as the left.  She has a cloudy urine with a little odor and also vaginal itching but no discharge. She is sexually active but is unsure if her partner is in a monogamous relationship with her.  Diabetes is managed by endocrine and she had a visit 3 months ago. She does continue to experience constipation coupled with her gastroparesis.  She takes Zofran on a chronic basis.  Colonoscopy scheduled for later in the month. Past Medical History:  Diagnosis Date   AKI (acute kidney injury) (HCC)    Anemia, iron deficiency    Anxiety and depression 05/18/2015   CAD (coronary artery disease), native coronary artery 01/11/2018   s/p CABG - Non-STEMI with severe three-vessel disease noted July 2019   Depression    Diabetic gastroparesis (HCC)    Diabetic neuropathy, type I diabetes mellitus (HCC) 05/18/2015   DKA, type 1 (HCC) 03/18/2016   Essential hypertension    Gastroparesis    GERD (gastroesophageal reflux disease)    HLD (hyperlipidemia)    MDD (major depressive disorder), single episode, severe , no psychosis (HCC)    Tardive dyskinesia    Vitamin B12 deficiency 08/16/2015    Past Surgical History:  Procedure Laterality Date    CARDIAC SURGERY     COLONOSCOPY  09/27/2014   at [facility]   CORONARY ARTERY BYPASS GRAFT N/A 01/14/2018   Procedure: CORONARY ARTERY BYPASS GRAFTING (CABG)X3, RIGHT AND LEFT SAPHENOUS VEIN HARVEST, MAMMARY TAKE DOWN. MAMMARY TO LAD, SVG TO PD, SVG TO DISTAL CIRC.;  Surgeon: Delight Ovens, MD;  Location: [facility] OR;  Service: Open Heart Surgery;  Laterality: N/A;   ESOPHAGOGASTRODUODENOSCOPY  09/27/2014   at [facility], Dr Vashti Hey. biospy neg for celiac, neg for H pylori.    EYE SURGERY     gailstones     INTRAMEDULLARY (IM) NAIL INTERTROCHANTERIC Right 05/10/2019   Procedure: INTRAMEDULLARY (IM) NAIL INTERTROCHANTRIC;  Surgeon: Lyndle Herrlich, MD;  Location: [facility] ORS;  Service: Orthopedics;  Laterality: Right;   IR FLUORO GUIDE CV LINE RIGHT  02/01/2017   IR FLUORO GUIDE CV LINE RIGHT  03/06/2017   IR FLUORO GUIDE CV LINE RIGHT  03/25/2017   IR GENERIC HISTORICAL  01/24/2016   IR FLUORO GUIDE CV LINE RIGHT 01/24/2016 Darrell K Allred, PA-C [facility]   IR GENERIC HISTORICAL  01/24/2016   IR US GUIDE VASC ACCESS RIGHT 01/24/2016 Darrell K Allred, PA-C [facility]   IR US GUIDE VASC ACCESS RIGHT  02/01/2017   IR US GUIDE VASC ACCESS RIGHT  03/06/2017   IR US GUIDE VASC ACCESS RIGHT  03/25/2017   IR US GUIDE VASC ACCESS RIGHT  01/20/2019   IR VENIPUNCTURE 41YRS/OLDER BY MD  01/20/2019  LEFT HEART CATH AND CORONARY ANGIOGRAPHY N/A 01/07/2018   Procedure: LEFT HEART CATH AND CORONARY ANGIOGRAPHY;  Surgeon: Lennette Bihari, MD;  Location: [facility] INVASIVE CV LAB;  Service: Cardiovascular;  Laterality: N/A;   POSTERIOR VITRECTOMY AND MEMBRANE PEEL-LEFT EYE  09/28/2002   POSTERIOR VITRECTOMY AND MEMBRANE PEEL-RIGHT EYE  03/16/2002   RETINAL DETACHMENT SURGERY     TEE WITHOUT CARDIOVERSION N/A 01/14/2018   Procedure: TRANSESOPHAGEAL ECHOCARDIOGRAM (TEE);  Surgeon: Delight Ovens, MD;  Location: [facility] OR;  Service: Open Heart Surgery;  Laterality: N/A;    Family History  Problem Relation Age of Onset   Cystic  fibrosis Mother    Hypertension Father    Diabetes Brother    Hypertension Maternal Grandmother     Social History   Socioeconomic History   Marital status: Single    Spouse name: Not on file   Number of children: 1   Years of education: Not on file   Highest education level: Not on file  Occupational History   Occupation: Unemployed  Tobacco Use   Smoking status: Never   Smokeless tobacco: Never  Vaping Use   Vaping Use: Never used  Substance and Sexual Activity   Alcohol use: No   Drug use: Yes    Types: Marijuana    Comment: occ   Sexual activity: Yes    Partners: Male    Birth control/protection: None  Other Topics Concern   Not on file  Social History Narrative   Not on file   Social Determinants of Health   Financial Resource Strain: Low Risk  (02/13/2022)   Overall Financial Resource Strain (CARDIA)    Difficulty of Paying Living Expenses: Not very hard  Food Insecurity: No Food Insecurity (02/13/2022)   Hunger Vital Sign    Worried About Running Out of Food in the Last Year: Never true    Ran Out of Food in the Last Year: Never true  Transportation Needs: No Transportation Needs (02/13/2022)   PRAPARE - Transportation    Lack of Transportation (Medical): No    Lack of Transportation (Non-Medical): No  Physical Activity: Inactive (02/13/2022)   Exercise Vital Sign    Days of Exercise per Week: 0 days    Minutes of Exercise per Session: 0 min  Stress: Stress Concern Present (02/13/2022)   [employer] of Occupational Health - Occupational Stress Questionnaire    Feeling of Stress : To some extent  Social Connections: Moderately Isolated (02/13/2022)   Social Connection and Isolation Panel [NHANES]    Frequency of Communication with Friends and Family: More than three times a week    Frequency of Social Gatherings with Friends and Family: Three times a week    Attends Religious Services: More than 4 times per year    Active Member of Clubs or  Organizations: No    Attends Banker Meetings: Never    Marital Status: Never married    Allergies  Allergen Reactions   Anesthetics, Amide Nausea And Vomiting   Chlorhexidine Other (See Comments)    Unknown reaction   Penicillins Diarrhea, Nausea And Vomiting and Other (See Comments)   Enkaid [Encainide] Nausea And Vomiting   Subutex [Buprenorphine] Rash    Outpatient Medications Prior to Visit  Medication Sig Dispense Refill   acetaminophen (TYLENOL) 500 MG tablet Take 1,000 mg by mouth at bedtime as needed for mild pain.     aspirin EC 81 MG tablet Take 1 tablet (81 mg total) by mouth daily.  furosemide (LASIX) 40 MG tablet Take 1 tablet (40 mg total) by mouth daily. 90 tablet 3   insulin aspart (NOVOLOG) 100 UNIT/ML injection Inject 7 Units into the skin 3 (three) times daily before meals. (Patient taking differently: Inject 12-14 Units into the skin 3 (three) times daily before meals. Sliding scale) 10 mL 5   insulin aspart (NOVOLOG) 100 UNIT/ML injection 100 units in pump daily 30 mL 3   Insulin Disposable Pump (OMNIPOD DASH PODS, GEN 4,) MISC SUB-Q Every Other Day (Patient not taking: Reported on 07/04/2022)     insulin glargine (LANTUS) 100 UNIT/ML injection Inject 7 Units into the skin 2 (two) times daily.     Insulin Syringe-Needle U-100 (BD INSULIN SYRINGE U/F) 31G X 5/16" 0.5 ML MISC USE FOUR TIMES DAILY FOR INSULIN 400 each 0   Insulin Syringes, Disposable, U-100 0.3 ML MISC Use to draw up insulin as instructed 200 each 5   losartan (COZAAR) 25 MG tablet Take 1 tablet (25 mg total) by mouth daily. Hold this medication if your systolic blood pressure ( top number) s less than 100. Please call your doctor is your systolic blood pressure is less than 100 90 tablet 3   metoCLOPramide (REGLAN) 10 MG tablet TAKE 1 TABLET(10 MG) BY MOUTH THREE TIMES DAILY BEFORE MEALS 90 tablet 1   metoprolol tartrate (LOPRESSOR) 25 MG tablet Take 0.5 tablets (12.5 mg total) by mouth  2 (two) times daily. Please check your blood pressure twice a day, please hold this medication if your systolic blood pressure ( top number) is less than 100, please call your doctor if your systolic blood pressure is less than 100. 90 tablet 3   pregabalin (LYRICA) 150 MG capsule Take 1 capsule (150 mg total) by mouth 2 (two) times daily. 180 capsule 3   rosuvastatin (CRESTOR) 20 MG tablet Take 1 tablet (20 mg total) by mouth daily. 90 tablet 3   silver sulfADIAZINE (SILVADENE) 1 % cream Apply 1 Application topically daily. 50 g 0   busPIRone (BUSPAR) 10 MG tablet Take 1 tablet (10 mg total) by mouth 3 (three) times daily. (Patient not taking: Reported on 07/04/2022) 90 tablet 2   doxycycline (VIBRA-TABS) 100 MG tablet Take 1 tablet (100 mg total) by mouth 2 (two) times daily. (Patient not taking: Reported on 07/04/2022) 20 tablet 0   ferrous sulfate 325 (65 FE) MG tablet Take 1 tablet (325 mg total) by mouth daily with breakfast. (Patient not taking: Reported on 02/13/2022) 30 tablet 2   mupirocin ointment (BACTROBAN) 2 % Apply 1 Application topically 2 (two) times daily. (Patient not taking: Reported on 07/04/2022) 30 g 2   ondansetron (ZOFRAN) 4 MG tablet Take 1 tablet (4 mg total) by mouth every 8 (eight) hours as needed for nausea or vomiting. 20 tablet 0   No facility-administered medications prior to visit.     ROS Review of Systems  Constitutional:  Negative for activity change and appetite change.  HENT:  Negative for sinus pressure and sore throat.   Respiratory:  Negative for chest tightness, shortness of breath and wheezing.   Cardiovascular:  Negative for chest pain and palpitations.  Gastrointestinal:  Positive for constipation and nausea. Negative for abdominal distention and abdominal pain.  Genitourinary: Negative.   Musculoskeletal:        See HPI  Psychiatric/Behavioral:  Negative for behavioral problems and dysphoric mood.     Objective:  BP 133/70   Pulse 70   Ht 5\' 1"   (1.549  m)   Wt 177 lb 12.8 oz (80.6 kg)   SpO2 98%   BMI 33.60 kg/m      10/31/2022    4:09 PM 07/04/2022   10:33 AM 03/09/2022   11:18 AM  BP/Weight  Systolic BP 133 124 128  Diastolic BP 70 80 64  Wt. (Lbs) 177.8 179.6   BMI 33.6 kg/m2 33.94 kg/m2       Physical Exam Constitutional:      Appearance: She is well-developed.  Cardiovascular:     Rate and Rhythm: Normal rate.     Heart sounds: Normal heart sounds. No murmur heard. Pulmonary:     Effort: Pulmonary effort is normal.     Breath sounds: Normal breath sounds. No wheezing or rales.  Chest:     Chest wall: No tenderness.  Abdominal:     General: Bowel sounds are normal. There is no distension.     Palpations: Abdomen is soft. There is no mass.     Tenderness: There is no abdominal tenderness.  Musculoskeletal:     Right lower leg: No edema.     Left lower leg: No edema.     Comments: Forward elevation of left shoulder restricted to 140 degrees Forward elevation of right shoulder restricted to 160 degrees Associated tenderness in left shoulder on range of motion.  Neurological:     Mental Status: She is alert and oriented to person, place, and time.  Psychiatric:        Mood and Affect: Mood normal.        Latest Ref Rng & Units 07/04/2022   11:29 AM 01/10/2022    1:03 AM 01/09/2022    7:13 AM  CMP  Glucose 70 - 99 mg/dL 161  096  045   BUN 6 - 23 mg/dL 21  18  15    Creatinine 0.40 - 1.20 mg/dL 4.09  8.11  9.14   Sodium 135 - 145 mEq/L 136  140  142   Potassium 3.5 - 5.1 mEq/L 4.6  4.0  4.1   Chloride 96 - 112 mEq/L 102  106  106   CO2 19 - 32 mEq/L 24  25  24    Calcium 8.4 - 10.5 mg/dL 9.5  8.9  8.9     Lipid Panel     Component Value Date/Time   CHOL 183 08/09/2021 1151   TRIG 104.0 08/09/2021 1151   HDL 57.50 08/09/2021 1151   CHOLHDL 3 08/09/2021 1151   VLDL 20.8 08/09/2021 1151   LDLCALC 105 (H) 08/09/2021 1151   LDLDIRECT 96.0 07/04/2022 1129    CBC    Component Value Date/Time    WBC 5.9 01/10/2022 0103   RBC 4.09 01/10/2022 0103   HGB 10.5 (L) 01/10/2022 0103   HGB 11.4 11/05/2018 1419   HCT 33.5 (L) 01/10/2022 0103   HCT 33.5 (L) 11/05/2018 1419   PLT 236 01/10/2022 0103   PLT 292 11/05/2018 1419   MCV 81.9 01/10/2022 0103   MCV 89 11/05/2018 1419   MCH 25.7 (L) 01/10/2022 0103   MCHC 31.3 01/10/2022 0103   RDW 14.4 01/10/2022 0103   RDW 12.7 11/05/2018 1419   LYMPHSABS 1.5 01/08/2022 1218   MONOABS 0.9 01/08/2022 1218   EOSABS 0.1 01/08/2022 1218   BASOSABS 0.1 01/08/2022 1218    Lab Results  Component Value Date   HGBA1C 10.7 (A) 07/04/2022    Lab Results  Component Value Date   COLORU yellow 10/31/2022   CLARITYU cloudy (A)  10/31/2022   GLUCOSEUR =500 (A) 10/31/2022   BILIRUBINUR negative 10/31/2022   KETONESU negative 11/13/2018   SPECGRAV 1.025 10/31/2022   RBCUR small (A) 10/31/2022   PHUR 6.0 10/31/2022   PROTEINUR 100 (A) 10/03/2021   UROBILINOGEN 1.0 10/31/2022   LEUKOCYTESUR Trace (A) 10/31/2022    Assessment & Plan:  1. Acute cystitis with hematuria UA positive for UTI Will send off urine culture and treat accordingly - POCT URINALYSIS DIP (CLINITEK) - Cervicovaginal ancillary only - Urine Culture  2. Adhesive capsulitis of left shoulder Uncontrolled She will benefit from cortisone injection and physical therapy - Ambulatory referral to Orthopedics  3. Vaginal candidiasis - fluconazole (DIFLUCAN) 150 MG tablet; Take 1 tablet (150 mg total) by mouth once for 1 dose. Then repeat in 72 hours.  Dispense: 2 tablet; Refill: 0  4. Encounter for screening mammogram for malignant neoplasm of breast - MM 3D SCREENING MAMMOGRAM BILATERAL BREAST; Future  5. Coronary artery disease involving native coronary artery of native heart without angina pectoris Status post CABG Asymptomatic Risk factor modification  6. Nausea Secondary to gastroparesis - ondansetron (ZOFRAN) 4 MG tablet; Take 1 tablet (4 mg total) by mouth every 8  (eight) hours as needed for nausea or vomiting.  Dispense: 90 tablet; Refill: 1  7.  Uncontrolled type 1 diabetes mellitus Uncontrolled with A1c of 10.7 Continue medication regimen as per endocrine Counseled on Diabetic diet, my plate method, 409 minutes of moderate intensity exercise/week Blood sugar logs with fasting goals of 80-120 mg/dl, random of less than 811 and in the event of sugars less than 60 mg/dl or greater than 914 mg/dl encouraged to notify the clinic. Advised on the need for annual eye exams, annual foot exams, Pneumonia vaccine.  Meds ordered this encounter  Medications   ondansetron (ZOFRAN) 4 MG tablet    Sig: Take 1 tablet (4 mg total) by mouth every 8 (eight) hours as needed for nausea or vomiting.    Dispense:  90 tablet    Refill:  1   fluconazole (DIFLUCAN) 150 MG tablet    Sig: Take 1 tablet (150 mg total) by mouth once for 1 dose. Then repeat in 72 hours.    Dispense:  2 tablet    Refill:  0    Follow-up: Return in about 6 months (around 05/03/2023) for Chronic medical conditions.       Hoy Register, MD, FAAFP. [facility] [place], [place] [phone]   10/31/2022, 6:05 PM

## 2022-11-01 ENCOUNTER — Other Ambulatory Visit: Payer: Self-pay | Admitting: Family Medicine

## 2022-11-01 LAB — CERVICOVAGINAL ANCILLARY ONLY
Bacterial Vaginitis (gardnerella): POSITIVE — AB
Candida Glabrata: NEGATIVE
Candida Vaginitis: POSITIVE — AB
Chlamydia: NEGATIVE
Comment: NEGATIVE
Comment: NEGATIVE
Comment: NEGATIVE
Comment: NEGATIVE
Comment: NEGATIVE
Comment: NORMAL
Neisseria Gonorrhea: NEGATIVE
Trichomonas: NEGATIVE

## 2022-11-01 MED ORDER — METRONIDAZOLE 0.75 % VA GEL: 1.0000 | Freq: Every day | VAGINAL | 0 refills | Status: AC

## 2022-11-05 ENCOUNTER — Telehealth: Payer: Self-pay | Admitting: Orthopaedic Surgery

## 2022-11-05 NOTE — Telephone Encounter (Signed)
Mailed reminder letter to patient today 

## 2022-11-06 ENCOUNTER — Telehealth: Payer: Self-pay

## 2022-11-06 ENCOUNTER — Other Ambulatory Visit: Payer: Self-pay | Admitting: Family Medicine

## 2022-11-06 LAB — URINE CULTURE

## 2022-11-06 MED ORDER — SULFAMETHOXAZOLE-TRIMETHOPRIM 800-160 MG PO TABS
1.0000 | ORAL_TABLET | Freq: Two times a day (BID) | ORAL | 0 refills | Status: DC
Start: 2022-11-06 — End: 2022-12-26

## 2022-11-06 MED ORDER — METRONIDAZOLE 500 MG PO TABS: 500.0000 mg | ORAL_TABLET | Freq: Two times a day (BID) | ORAL | 0 refills | Status: AC

## 2022-11-06 NOTE — Telephone Encounter (Signed)
Pt has been informed of medication being sent. 

## 2022-11-06 NOTE — Telephone Encounter (Signed)
I have sent a prescription for metronidazole pills to her pharmacy instead.

## 2022-11-06 NOTE — Telephone Encounter (Signed)
Copied from CRM [id number]. Topic: General - Inquiry >> Nov 06, 2022 11:09 AM Clide Dales wrote: Patient states that the metroNIDAZOLE (METROGEL) 0.75 % vaginal gel is too expensive and would like to know if there is a more affordable alternative. Please advise.

## 2022-11-06 NOTE — Telephone Encounter (Signed)
Routing to PCP for review.

## 2022-11-06 NOTE — Addendum Note (Signed)
Addended by: Hoy Register on: 11/06/2022 01:30 PM   Modules accepted: Orders

## 2022-11-28 ENCOUNTER — Ambulatory Visit (INDEPENDENT_AMBULATORY_CARE_PROVIDER_SITE_OTHER): Payer: Medicare HMO | Admitting: Orthopaedic Surgery

## 2022-11-28 ENCOUNTER — Other Ambulatory Visit (INDEPENDENT_AMBULATORY_CARE_PROVIDER_SITE_OTHER): Payer: Medicare HMO

## 2022-11-28 DIAGNOSIS — M7502 Adhesive capsulitis of left shoulder: Secondary | ICD-10-CM

## 2022-11-28 DIAGNOSIS — R6889 Other general symptoms and signs: Secondary | ICD-10-CM | POA: Diagnosis not present

## 2022-11-28 NOTE — Progress Notes (Signed)
Office Visit Note   Patient: [name]           Date of Birth: January 25, 1966           MRN: [id number] Visit Date: 11/28/2022              Requested by: Hoy Register, MD [address] [place] [place],  [place] [address] PCP: Hoy Register, MD   Assessment & Plan: Visit Diagnoses:  1. Adhesive capsulitis of left shoulder     Plan: Impression is 57 year old female with left shoulder adhesive capsulitis.  She has poorly controlled diabetes as well.  Counseled the patient on the importance of getting the diabetes under control and how this affects prognosis of the adhesive capsulitis.  We will send her to Dr. Shon Baton for intra-articular steroid injection.  I made a referral to outpatient PT.  She can follow-up as needed.  Follow-Up Instructions: No follow-ups on file.   Orders:  Orders Placed This Encounter  Procedures   XR Shoulder Left   Ambulatory referral to Physical Therapy   No orders of the defined types were placed in this encounter.     Procedures: No procedures performed   Clinical Data: No additional findings.   Subjective: Chief Complaint  Patient presents with   Left Shoulder - Pain    HPI Patient is a 57 year old female here for evaluation of left shoulder pain without any injury or trauma.  Woke up 1 morning with pain.  She has pain to the back and shoulder that travels down her arm.  She has been using heat and Tylenol without significant relief.  Patient walks with a rollator due to previous tibia injury. Review of Systems  Constitutional: Negative.   HENT: Negative.    Eyes: Negative.   Respiratory: Negative.    Cardiovascular: Negative.   Endocrine: Negative.   Musculoskeletal: Negative.   Neurological: Negative.   Hematological: Negative.   Psychiatric/Behavioral: Negative.    All other systems reviewed and are negative.    Objective: Vital Signs: There were no vitals taken for this visit.  Physical Exam Vitals and nursing  note reviewed.  Constitutional:      Appearance: She is well-developed.  HENT:     Head: Atraumatic.     Nose: Nose normal.  Eyes:     Extraocular Movements: Extraocular movements intact.  Cardiovascular:     Pulses: Normal pulses.  Pulmonary:     Effort: Pulmonary effort is normal.  Abdominal:     Palpations: Abdomen is soft.  Musculoskeletal:     Cervical back: Neck supple.  Skin:    General: Skin is warm.     Capillary Refill: Capillary refill takes less than 2 seconds.  Neurological:     Mental Status: She is alert. Mental status is at baseline.  Psychiatric:        Behavior: Behavior normal.        Thought Content: Thought content normal.        Judgment: Judgment normal.     Ortho Exam Examination of left shoulder shows significant limitation to range of motion in all planes consistent with adhesive capsulitis.  Manual muscle testing of the rotator cuff is grossly intact. Specialty Comments:  No specialty comments available.  Imaging: No results found.   PMFS History: Patient Active Problem List   Diagnosis Date Noted   Pressure injury of skin 10/05/2021   Achilles tendon contracture, right 11/30/2020   Closed fracture of proximal end of femur, right,  initial encounter (HCC) 05/09/2019   Body mass index (BMI) 30.0-30.9, adult 04/26/2019   Hypertensive chronic kidney disease w stg 1-4/unsp chr kdny 04/26/2019   Obesity, unspecified 04/26/2019   CAD (coronary artery disease) 03/06/2019   Acute encephalopathy 12/22/2018   Presence of aortocoronary bypass graft 06/25/2018   Diabetic ketoacidosis (HCC) 04/30/2018   Gastroparesis 04/13/2018   DKA (diabetic ketoacidosis) (HCC) 03/31/2018   S/P CABG x 3 01/14/2018   CAD (coronary artery disease), native coronary artery 01/11/2018   Dehydration    MDD (major depressive disorder), single episode, severe , no psychosis (HCC)    DKA (diabetic ketoacidoses) 06/16/2017   Tardive dyskinesia    Type 1 diabetes mellitus  with hyperlipidemia (HCC) 03/18/2016   Noncompliance with treatment plan 03/13/2016   Essential hypertension 01/24/2016   HLD (hyperlipidemia)    GERD (gastroesophageal reflux disease)    AKI (acute kidney injury) (HCC)    Anemia, iron deficiency    Vitamin B12 deficiency 08/16/2015   Diabetic gastroparesis (HCC)    Diabetic neuropathy, type I diabetes mellitus (HCC) 05/18/2015   Anxiety and depression 05/18/2015   Past Medical History:  Diagnosis Date   AKI (acute kidney injury) (HCC)    Anemia, iron deficiency    Anxiety and depression 05/18/2015   CAD (coronary artery disease), native coronary artery 01/11/2018   s/p CABG - Non-STEMI with severe three-vessel disease noted July 2019   Depression    Diabetic gastroparesis (HCC)    Diabetic neuropathy, type I diabetes mellitus (HCC) 05/18/2015   DKA, type 1 (HCC) 03/18/2016   Essential hypertension    Gastroparesis    GERD (gastroesophageal reflux disease)    HLD (hyperlipidemia)    MDD (major depressive disorder), single episode, severe , no psychosis (HCC)    Tardive dyskinesia    Vitamin B12 deficiency 08/16/2015    Family History  Problem Relation Age of Onset   Cystic fibrosis Mother    Hypertension Father    Diabetes Brother    Hypertension Maternal Grandmother     Past Surgical History:  Procedure Laterality Date   CARDIAC SURGERY     COLONOSCOPY  09/27/2014   at [facility]   CORONARY ARTERY BYPASS GRAFT N/A 01/14/2018   Procedure: CORONARY ARTERY BYPASS GRAFTING (CABG)X3, RIGHT AND LEFT SAPHENOUS VEIN HARVEST, MAMMARY TAKE DOWN. MAMMARY TO LAD, SVG TO PD, SVG TO DISTAL CIRC.;  Surgeon: Delight Ovens, MD;  Location: [facility];  Service: Open Heart Surgery;  Laterality: N/A;   ESOPHAGOGASTRODUODENOSCOPY  09/27/2014   at [facility], Dr Vashti Hey. biospy neg for celiac, neg for H pylori.    EYE SURGERY     gailstones     INTRAMEDULLARY (IM) NAIL INTERTROCHANTERIC Right 05/10/2019   Procedure: INTRAMEDULLARY (IM) NAIL  INTERTROCHANTRIC;  Surgeon: Lyndle Herrlich, MD;  Location: [facility] ORS;  Service: Orthopedics;  Laterality: Right;   IR FLUORO GUIDE CV LINE RIGHT  02/01/2017   IR FLUORO GUIDE CV LINE RIGHT  03/06/2017   IR FLUORO GUIDE CV LINE RIGHT  03/25/2017   IR GENERIC HISTORICAL  01/24/2016   IR FLUORO GUIDE CV LINE RIGHT 01/24/2016 Darrell K Allred, PA-C [facility]   IR GENERIC HISTORICAL  01/24/2016   IR US GUIDE VASC ACCESS RIGHT 01/24/2016 Darrell K Allred, PA-C [facility]   IR US GUIDE VASC ACCESS RIGHT  02/01/2017   IR US GUIDE VASC ACCESS RIGHT  03/06/2017   IR US GUIDE VASC ACCESS RIGHT  03/25/2017   IR US GUIDE VASC ACCESS  RIGHT  01/20/2019   IR VENIPUNCTURE 78YRS/OLDER BY MD  01/20/2019   LEFT HEART CATH AND CORONARY ANGIOGRAPHY N/A 01/07/2018   Procedure: LEFT HEART CATH AND CORONARY ANGIOGRAPHY;  Surgeon: Lennette Bihari, MD;  [facility]: [facility];  Service: Cardiovascular;  Laterality: N/A;   POSTERIOR VITRECTOMY AND MEMBRANE PEEL-LEFT EYE  09/28/2002   POSTERIOR VITRECTOMY AND MEMBRANE PEEL-RIGHT EYE  03/16/2002   RETINAL DETACHMENT SURGERY     TEE WITHOUT CARDIOVERSION N/A 01/14/2018   Procedure: TRANSESOPHAGEAL ECHOCARDIOGRAM (TEE);  Surgeon: Delight Ovens, MD;  Location: [facility] OR;  Service: Open Heart Surgery;  Laterality: N/A;   Social History   Occupational History   Occupation: Unemployed  Tobacco Use   Smoking status: Never   Smokeless tobacco: Never  Vaping Use   Vaping Use: Never used  Substance and Sexual Activity   Alcohol use: No   Drug use: Yes    Types: Marijuana    Comment: occ   Sexual activity: Yes    Partners: Male    Birth control/protection: None

## 2022-11-29 ENCOUNTER — Telehealth: Payer: Self-pay | Admitting: Orthopaedic Surgery

## 2022-11-29 ENCOUNTER — Other Ambulatory Visit: Payer: Self-pay | Admitting: Physician Assistant

## 2022-11-29 MED ORDER — TRAMADOL HCL 50 MG PO TABS: 50.0000 mg | ORAL_TABLET | Freq: Two times a day (BID) | ORAL | 0 refills | Status: DC | PRN

## 2022-11-29 NOTE — Telephone Encounter (Signed)
Patient called. Says she needs some pain medication called in for her. The OTC is not helping.

## 2022-11-29 NOTE — Telephone Encounter (Signed)
Sent in tramadol to pharmacy on file

## 2022-12-03 NOTE — Telephone Encounter (Signed)
completed

## 2022-12-07 NOTE — Therapy (Signed)
OUTPATIENT PHYSICAL THERAPY SHOULDER EVALUATION   Patient Name: [name] MRN: [id number] DOB:Feb 08, 1966, 57 y.o., female Today's Date: 12/10/2022  Referring diagnosis? M75.02 Adhesive capsulitis Lt shoulder  Treatment diagnosis? (if different than referring diagnosis) M25.512 pain in left shoulder; M25.612 stiffness in left shoulder   What was this (referring dx) caused by? []  Surgery []  Fall [x]  Ongoing issue []  Arthritis []  Other: ____________  Laterality: []  Rt [x]  Lt []  Both  Check all possible CPT codes:  *CHOOSE 10 OR LESS*    [x]  97110 (Therapeutic Exercise)  []  92507 (SLP Treatment)  [x]  78295 (Neuro Re-ed)   []  92526 (Swallowing Treatment)   []  97116 (Gait Training)   []  [id number] (Cognitive Training, 1st 15 minutes) [x]  97140 (Manual Therapy)   []  97130 (Cognitive Training, each add'l 15 minutes)  [x]  97164 (Re-evaluation)                              []  Other, List CPT Code ____________  [x]  97530 (Therapeutic Activities)     [x]  97535 (Self Care)   []  All codes above (97110 - 97535)  []  97012 (Mechanical Traction)  [x]  97014 (E-stim Unattended)  []  97032 (E-stim manual)  []  97033 (Ionto)  []  97035 (Ultrasound) [x]  97750 (Physical Performance Training) []  [id number] (Aquatic Therapy) [x]  97016 (Vasopneumatic Device) []  [id number] (Paraffin) []  97034 (Contrast Bath) []  97597 (Wound Care 1st 20 sq cm) []  97598 (Wound Care each add'l 20 sq cm) []  97760 (Orthotic Fabrication, Fitting, Training Initial) []  [id number] (Prosthetic Management and Training Initial) []  [id number] (Orthotic or Prosthetic Training/ Modification Subsequent)  END OF SESSION:  PT End of Session - 12/10/22 1508     Visit Number 1    Date for PT Re-Evaluation 02/04/23    Authorization Type [employer] Medicare    Authorization Time Period 12/10/22 to 02/04/23    Progress Note Due on Visit 10    PT Start Time 1102    PT Stop Time 1145    PT Time Calculation (min) 43 min    Activity Tolerance Patient  tolerated treatment well    Behavior During Therapy [facility] for tasks assessed/performed             Past Medical History:  Diagnosis Date   AKI (acute kidney injury) (HCC)    Anemia, iron deficiency    Anxiety and depression 05/18/2015   CAD (coronary artery disease), native coronary artery 01/11/2018   s/p CABG - Non-STEMI with severe three-vessel disease noted July 2019   Depression    Diabetic gastroparesis (HCC)    Diabetic neuropathy, type I diabetes mellitus (HCC) 05/18/2015   DKA, type 1 (HCC) 03/18/2016   Essential hypertension    Gastroparesis    GERD (gastroesophageal reflux disease)    HLD (hyperlipidemia)    MDD (major depressive disorder), single episode, severe , no psychosis (HCC)    Tardive dyskinesia    Vitamin B12 deficiency 08/16/2015   Past Surgical History:  Procedure Laterality Date   CARDIAC SURGERY     COLONOSCOPY  09/27/2014   at [facility]   CORONARY ARTERY BYPASS GRAFT N/A 01/14/2018   Procedure: CORONARY ARTERY BYPASS GRAFTING (CABG)X3, RIGHT AND LEFT SAPHENOUS VEIN HARVEST, MAMMARY TAKE DOWN. MAMMARY TO LAD, SVG TO PD, SVG TO DISTAL CIRC.;  Surgeon: Delight Ovens, MD;  Location: [facility];  Service: Open Heart Surgery;  Laterality: N/A;   ESOPHAGOGASTRODUODENOSCOPY  09/27/2014   at [facility], Dr Vashti Hey. biospy  neg for celiac, neg for H pylori.    EYE SURGERY     gailstones     INTRAMEDULLARY (IM) NAIL INTERTROCHANTERIC Right 05/10/2019   Procedure: INTRAMEDULLARY (IM) NAIL INTERTROCHANTRIC;  Surgeon: Lyndle Herrlich, MD;  Location: [facility] ORS;  Service: Orthopedics;  Laterality: Right;   IR FLUORO GUIDE CV LINE RIGHT  02/01/2017   IR FLUORO GUIDE CV LINE RIGHT  03/06/2017   IR FLUORO GUIDE CV LINE RIGHT  03/25/2017   IR GENERIC HISTORICAL  01/24/2016   IR FLUORO GUIDE CV LINE RIGHT 01/24/2016 Darrell K Allred, PA-C [facility]   IR GENERIC HISTORICAL  01/24/2016   IR US GUIDE VASC ACCESS RIGHT 01/24/2016 Darrell K Allred, PA-C [facility]   IR US GUIDE VASC  ACCESS RIGHT  02/01/2017   IR US GUIDE VASC ACCESS RIGHT  03/06/2017   IR US GUIDE VASC ACCESS RIGHT  03/25/2017   IR US GUIDE VASC ACCESS RIGHT  01/20/2019   IR VENIPUNCTURE 48YRS/OLDER BY MD  01/20/2019   LEFT HEART CATH AND CORONARY ANGIOGRAPHY N/A 01/07/2018   Procedure: LEFT HEART CATH AND CORONARY ANGIOGRAPHY;  Surgeon: Lennette Bihari, MD;  [facility];  Service: Cardiovascular;  Laterality: N/A;   POSTERIOR VITRECTOMY AND MEMBRANE PEEL-LEFT EYE  09/28/2002   POSTERIOR VITRECTOMY AND MEMBRANE PEEL-RIGHT EYE  03/16/2002   RETINAL DETACHMENT SURGERY     TEE WITHOUT CARDIOVERSION N/A 01/14/2018   Procedure: TRANSESOPHAGEAL ECHOCARDIOGRAM (TEE);  Surgeon: Delight Ovens, MD;  Location: [facility] OR;  Service: Open Heart Surgery;  Laterality: N/A;   Patient Active Problem List   Diagnosis Date Noted   Pressure injury of skin 10/05/2021   Achilles tendon contracture, right 11/30/2020   Closed fracture of proximal end of femur, right, initial encounter (HCC) 05/09/2019   Body mass index (BMI) 30.0-30.9, adult 04/26/2019   Hypertensive chronic kidney disease w stg 1-4/unsp chr kdny 04/26/2019   Obesity, unspecified 04/26/2019   CAD (coronary artery disease) 03/06/2019   Acute encephalopathy 12/22/2018   Presence of aortocoronary bypass graft 06/25/2018   Diabetic ketoacidosis (HCC) 04/30/2018   Gastroparesis 04/13/2018   DKA (diabetic ketoacidosis) (HCC) 03/31/2018   S/P CABG x 3 01/14/2018   CAD (coronary artery disease), native coronary artery 01/11/2018   Dehydration    MDD (major depressive disorder), single episode, severe , no psychosis (HCC)    DKA (diabetic ketoacidoses) 06/16/2017   Tardive dyskinesia    Type 1 diabetes mellitus with hyperlipidemia (HCC) 03/18/2016   Noncompliance with treatment plan 03/13/2016   Essential hypertension 01/24/2016   HLD (hyperlipidemia)    GERD (gastroesophageal reflux disease)    AKI (acute kidney injury) (HCC)    Anemia, iron  deficiency    Vitamin B12 deficiency 08/16/2015   Diabetic gastroparesis (HCC)    Diabetic neuropathy, type I diabetes mellitus (HCC) 05/18/2015   Anxiety and depression 05/18/2015    PCP: Hoy Register, MD  REFERRING PROVIDER: Gershon Mussel, MD  REFERRING DIAG: adhesive capsulitis of Lt shoulder   THERAPY DIAG:  Stiffness of left shoulder, not elsewhere classified  Left shoulder pain, unspecified chronicity  Rationale for Evaluation and Treatment: Rehabilitation  ONSET DATE: approximately 1 month ago  SUBJECTIVE:  SUBJECTIVE STATEMENT: Pt states that she woke up one morning with Lt shoulder pain. She thought she might have slept on it wrong. It continued through the week and she saw her PCP. She was then referred to Dr. Roda Shutters who evaluated her and told her she has frozen shoulder. She was also referred for a shoulder injection and states she will be getting that this Friday. She feels that it is worse than when it started, now having pain in the mid upper arm, neck and difficulty with ADLs.  Hand dominance: Right  PERTINENT HISTORY: DM-uncontrolled  Stiffness of Rt shoulder  PAIN:  Are you having pain? Yes: NPRS scale: 7/10; gets up to 10/10 at night  Pain location: Lt shoulder, neck, upper arm Pain description: ache, stiff Aggravating factors: using the arm, sleeping on Lt side Relieving factors: heat, tylenol and tramadol  PRECAUTIONS: None  WEIGHT BEARING RESTRICTIONS: No  FALLS:  Has patient fallen in last 6 months? No  LIVING ENVIRONMENT: Lives with: lives alone Lives in: House/apartment Stairs:  Has following equipment at home:  walker- 3 wheeled rollator  OCCUPATION:  On disability   PLOF: Independent  PATIENT GOALS: wash hair and just be able to sit without pain  NEXT MD VISIT:  none for now   OBJECTIVE:   DIAGNOSTIC FINDINGS:    PATIENT SURVEYS:  FOTO 50  COGNITION: Overall cognitive status: Within functional limits for tasks assessed     SENSATION: Not tested  POSTURE: Rounded shoulders/forward head  UPPER EXTREMITY ROM:   Active ROM Right eval Left eval  Shoulder flexion 85 85  Shoulder extension  10  Shoulder abduction  80  Shoulder adduction    Shoulder internal rotation RBB: L5 RBB: L5  Shoulder external rotation RBH: base of skull RBH: to ear  Elbow flexion    Elbow extension    Wrist flexion    Wrist extension    Wrist ulnar deviation    Wrist radial deviation    Wrist pronation    Wrist supination    (Blank rows = not tested)  UPPER EXTREMITY MMT:  MMT Right eval Left eval  Shoulder flexion 5 5  Shoulder extension    Shoulder abduction 5 4 (+) pain  Shoulder adduction    Shoulder internal rotation  5  Shoulder external rotation  4  Middle trapezius    Lower trapezius    Elbow flexion 5 5  Elbow extension 5 5  Wrist flexion    Wrist extension    Wrist ulnar deviation    Wrist radial deviation    Wrist pronation    Wrist supination    Grip strength (lbs)    (Blank rows = not tested)  SHOULDER SPECIAL TESTS: Impingement tests:  SLAP lesions:  Instability tests:  Rotator cuff assessment:  Biceps assessment:   JOINT MOBILITY TESTING:  Hypomobile Lt GH joint   PALPATION:  Muscle tension/spasm posterior shoulder, upper trap, deltiod, bicep   TODAY'S TREATMENT:  DATE:  HEP review: see below Self massage with tennis ball   PATIENT EDUCATION: Education details: eval findings/POC; implemented HEP Person educated: Patient Education method: Explanation, Demonstration, and Handouts Education comprehension: verbalized understanding and returned demonstration  HOME EXERCISE  PROGRAM: Access Code: [email or web address] URL: [email or web address] Date: 12/10/2022 Prepared by: [facility] - [facility]  Exercises - Standing Shoulder Posterior Capsule Stretch  - 3 x daily - 7 x weekly - 2 reps - 30 seconds hold - Shoulder Rolls in Sitting  - 3 x daily - 7 x weekly - 2 sets - 10 reps - Seated Shoulder Flexion Towel Slide at Table Top  - 3 x daily - 7 x weekly - 1 sets - 10 reps - 10seconds hold  ASSESSMENT:  CLINICAL IMPRESSION: Patient is a 57 y.o. F who was seen today for physical therapy evaluation and treatment for adhesive capsulitis of the Lt shoulder. Pt's pain and stiffness was onset about 1 month ago without no known cause and appears to have gotten worse. She is unable to complete basic ADLs like washing/fixing her hair, and donning/doffing clothes. Pt lacks Rt and Lt shoulder elevation above 90 degs, although it is worse on the Lt. Significant GH joint restriction and posterior shoulder/upper trap muscle tension noted. GH strength is overall good but limited in external rotation and scap strength. Pt has Rt shoulder stiffness similar to the Lt, and she notes it has been this way "for a long time". She would benefit from skilled PT to address her limitations in shoulder ROM, strength, and neuromuscular control to improve her quality of life and independence with ADLs.  OBJECTIVE IMPAIRMENTS: decreased activity tolerance, decreased knowledge of condition, decreased mobility, decreased ROM, decreased strength, hypomobility, increased muscle spasms, impaired UE functional use, improper body mechanics, and pain.   ACTIVITY LIMITATIONS: carrying, lifting, sleeping, bathing, toileting, dressing, reach over head, and hygiene/grooming  PARTICIPATION LIMITATIONS: cleaning, laundry, interpersonal relationship, community activity, and yard work  PERSONAL FACTORS: Education, Financial risk analyst, Past/current experiences, Sex, Time since onset of  injury/illness/exacerbation, and 1 comorbidity: poorly controlled diabetes  are also affecting patient's functional outcome.   REHAB POTENTIAL: Fair considering pt's poorly controlled diabetes and understanding of the diagnosis  CLINICAL DECISION MAKING: Evolving/moderate complexity  EVALUATION COMPLEXITY: Moderate   GOALS: Goals reviewed with patient? Yes  SHORT TERM GOALS: Target date: 12/15/22  Pt will be independent and consistent with her initial HEP to improve shoulder ROM. Baseline: Goal status: INITIAL   LONG TERM GOALS: Target date: 02/04/23  Pt will have increase in Lt shoulder flexion A/ROM to atleast 120 deg which will improve her ability to reach overhead into the cabinet.  Baseline:  Goal status: INITIAL  2.  Pt will report 50% improvement in sleep quality throughout the week, secondary to improvements in Lt shoulder pain.  Baseline:  Goal status: INITIAL  3.  Pt will have Lt UE reach to T2 which will allow her to wash her hair independently.  Baseline:  Goal status: INITIAL  4.  Pt will have good awareness of scapula mechanics, evident by her ability to self correct shoulder shrug throughout her session.  Baseline:  Goal status: INITIAL  5.  Pt FOTO will improve to 60, reflecting an improvement in shoulder function from the start of PT. Baseline:  Goal status: INITIAL  6.  Pt will have improvement in shoulder internal and external rotation by atleast 10 degrees each direction which will improve her ability to don/doff clothing without assistance. Baseline:  Goal status: INITIAL  PLAN:  PT FREQUENCY: 1-2x/week  PT DURATION: 8 weeks  PLANNED INTERVENTIONS: Therapeutic exercises, Therapeutic activity, Neuromuscular re-education, Balance training, Gait training, Patient/Family education, Self Care, Joint mobilization, Dry Needling, Cryotherapy, Moist heat, Taping, Manual therapy, and Re-evaluation  PLAN FOR NEXT SESSION: progress shoulder ROM, scap  strength, manual/DN for areas of muscle tension-pain reflief    3:29 PM,12/10/22 Donita Brooks PT, DPT [facility] [place]  [phone]

## 2022-12-10 ENCOUNTER — Ambulatory Visit: Payer: Medicare HMO | Admitting: Physical Therapy

## 2022-12-10 ENCOUNTER — Encounter: Payer: Self-pay | Admitting: Physical Therapy

## 2022-12-10 ENCOUNTER — Other Ambulatory Visit: Payer: Self-pay

## 2022-12-10 DIAGNOSIS — M25512 Pain in left shoulder: Secondary | ICD-10-CM | POA: Diagnosis not present

## 2022-12-10 DIAGNOSIS — M25612 Stiffness of left shoulder, not elsewhere classified: Secondary | ICD-10-CM

## 2022-12-10 DIAGNOSIS — R6889 Other general symptoms and signs: Secondary | ICD-10-CM | POA: Diagnosis not present

## 2022-12-14 ENCOUNTER — Encounter: Payer: Self-pay | Admitting: Sports Medicine

## 2022-12-14 ENCOUNTER — Ambulatory Visit: Payer: Medicare HMO | Admitting: Sports Medicine

## 2022-12-14 ENCOUNTER — Other Ambulatory Visit: Payer: Self-pay

## 2022-12-14 DIAGNOSIS — M25512 Pain in left shoulder: Secondary | ICD-10-CM | POA: Diagnosis not present

## 2022-12-14 DIAGNOSIS — M7502 Adhesive capsulitis of left shoulder: Secondary | ICD-10-CM | POA: Diagnosis not present

## 2022-12-14 DIAGNOSIS — E785 Hyperlipidemia, unspecified: Secondary | ICD-10-CM

## 2022-12-14 DIAGNOSIS — R6889 Other general symptoms and signs: Secondary | ICD-10-CM | POA: Diagnosis not present

## 2022-12-14 DIAGNOSIS — G8929 Other chronic pain: Secondary | ICD-10-CM

## 2022-12-14 DIAGNOSIS — E1069 Type 1 diabetes mellitus with other specified complication: Secondary | ICD-10-CM | POA: Diagnosis not present

## 2022-12-14 MED ORDER — LIDOCAINE HCL 1 % IJ SOLN
2.0000 mL | INTRAMUSCULAR | Status: AC | PRN
Start: 2022-12-14 — End: 2022-12-14
  Administered 2022-12-14: 2 mL

## 2022-12-14 MED ORDER — METHYLPREDNISOLONE ACETATE 40 MG/ML IJ SUSP
40.0000 mg | INTRAMUSCULAR | Status: AC | PRN
Start: 2022-12-14 — End: 2022-12-14
  Administered 2022-12-14: 40 mg via INTRA_ARTICULAR

## 2022-12-14 MED ORDER — BUPIVACAINE HCL 0.25 % IJ SOLN
2.0000 mL | INTRAMUSCULAR | Status: AC | PRN
Start: 2022-12-14 — End: 2022-12-14
  Administered 2022-12-14: 2 mL via INTRA_ARTICULAR

## 2022-12-14 NOTE — Progress Notes (Signed)
Office & Procedure Note  Patient: [name]             Date of Birth: 07/13/1965           MRN: [id number]             Visit Date: 12/14/2022  HPI: [name] is a pleasant 57 year old female who presents with chronic left shoulder pain with diagnosed adhesive capsulitis.  She is a type I diabetic managed on short and long-acting insulin.  She does check her sugars multiple times per day.  Had her first physical therapy evaluation, her next upcoming appointment is next Friday.  Did get some home exercises for her to do at home.  PE:  -Left shoulder: No specific bony TTP.  There is limited active and passive range of motion.  Forward flexion to approximately 165 degrees, external rotation 30 degrees, internal rotation with thumb only to the level of the SI joint compared to T11 of the contralateral arm.  Imaging: XR Shoulder Left No acute or structural abnormalities   Procedures: Visit Diagnoses:  1. Adhesive capsulitis of left shoulder   2. Chronic left shoulder pain   3. Type 1 diabetes mellitus with hyperlipidemia (HCC)    Large Joint Inj: L glenohumeral on 12/14/2022 9:37 AM Indications: pain Details: 22 G 3.5 in needle, ultrasound-guided posterior approach Medications: 2 mL lidocaine 1 %; 2 mL bupivacaine 0.25 %; 40 mg methylPREDNISolone acetate 40 MG/ML Outcome: tolerated well, no immediate complications  US-guided glenohumeral joint injection, left shoulder After discussion on risks/benefits/indications, informed verbal consent was obtained. A timeout was then performed. The patient was positioned lying lateral recumbent on examination table. The patient's shoulder was prepped with betadine and multiple alcohol swabs and utilizing ultrasound guidance, the patient's glenohumeral joint was identified on ultrasound. Using ultrasound guidance a 22-gauge, 3.5 inch needle with a mixture of 2:2:1 cc's lidocaine:bupivicaine:depomedrol was directed from a lateral to medial direction via  in-plane technique into the glenohumeral joint with visualization of appropriate spread of injectate into the joint. Patient tolerated the procedure well without immediate complications.      Procedure, treatment alternatives, risks and benefits explained, specific risks discussed. Consent was given by the patient. Immediately prior to procedure a time out was called to verify the correct patient, procedure, equipment, support staff and site/side marked as required. Patient was prepped and draped in the usual sterile fashion.     Plan:  -Through shared decision-making, elected to proceed with ultrasound-guided glenohumeral joint injection, patient tolerated well. -I did discuss the transient elevation in glucose levels, she will check her sugars multiple times a day and adjust her short and long-acting insulin as needed.  Encouraged increasing water intake.  She will continue to work on tight glucose control and dietary changes to help improve her frozen shoulder -Continue her formal physical therapy, discussed with her doing her home exercises daily, she may start these tomorrow and work on flexion.  Mild pain to stretch out the capsule - I evaluated the patient about 5 minutes post-injection and she had good improvement in pain and range of motion - F/u with me in about 2 weeks for repeat evaluation; if not > 80% improved, may consider additional injection (large volume) - follow-up with Dr. Roda Shutters then as indicated; I am happy to see them as needed  Madelyn Brunner, DO Primary Care Sports Medicine Physician  [facility] - Orthopedics  This note was dictated using Dragon naturally speaking software and may contain errors in  syntax, spelling, or content which have not been identified prior to signing this note.

## 2022-12-19 ENCOUNTER — Telehealth: Payer: Self-pay | Admitting: Endocrinology

## 2022-12-19 ENCOUNTER — Other Ambulatory Visit: Payer: Self-pay

## 2022-12-19 DIAGNOSIS — E1065 Type 1 diabetes mellitus with hyperglycemia: Secondary | ICD-10-CM

## 2022-12-19 MED ORDER — INSULIN GLARGINE 100 UNIT/ML ~~LOC~~ SOLN
SUBCUTANEOUS | 0 refills | Status: DC
Start: 2022-12-19 — End: 2023-02-14

## 2022-12-19 MED ORDER — INSULIN GLARGINE 100 UNIT/ML ~~LOC~~ SOLN: SUBCUTANEOUS | 0 refills | Status: DC

## 2022-12-19 NOTE — Telephone Encounter (Signed)
Patient is a former patient of Dr. Lucianne Muss and has an appointment scheduled with Dr. Roosevelt Locks in Aug. Patient needing a refill on [place]- call into [facility] at [address] [place]. Please advise patient when done.

## 2022-12-21 ENCOUNTER — Ambulatory Visit (INDEPENDENT_AMBULATORY_CARE_PROVIDER_SITE_OTHER): Payer: Medicare HMO | Admitting: Rehabilitative and Restorative Service Providers"

## 2022-12-21 ENCOUNTER — Encounter: Payer: Self-pay | Admitting: Rehabilitative and Restorative Service Providers"

## 2022-12-21 DIAGNOSIS — M25612 Stiffness of left shoulder, not elsewhere classified: Secondary | ICD-10-CM

## 2022-12-21 DIAGNOSIS — M25512 Pain in left shoulder: Secondary | ICD-10-CM | POA: Diagnosis not present

## 2022-12-21 DIAGNOSIS — R6889 Other general symptoms and signs: Secondary | ICD-10-CM | POA: Diagnosis not present

## 2022-12-21 NOTE — Therapy (Addendum)
OUTPATIENT PHYSICAL THERAPY SHOULDER EVALUATION  / DISCHARGE   Patient Name: [name] MRN: [id number] DOB:11-04-1965, 57 y.o., female Today's Date: 12/21/2022  Referring diagnosis? M75.02 Adhesive capsulitis Lt shoulder  Treatment diagnosis? (if different than referring diagnosis) M25.512 pain in left shoulder; M25.612 stiffness in left shoulder   What was this (referring dx) caused by? []  Surgery []  Fall [x]  Ongoing issue []  Arthritis []  Other: ____________  Laterality: []  Rt [x]  Lt []  Both  Check all possible CPT codes:  *CHOOSE 10 OR LESS*    [x]  97110 (Therapeutic Exercise)  []  41324 (SLP Treatment)  [x]  97112 (Neuro Re-ed)   []  92526 (Swallowing Treatment)   []  97116 (Gait Training)   []  [id number] (Cognitive Training, 1st 15 minutes) [x]  97140 (Manual Therapy)   []  97130 (Cognitive Training, each add'l 15 minutes)  [x]  97164 (Re-evaluation)                              []  Other, List CPT Code ____________  [x]  97530 (Therapeutic Activities)     [x]  97535 (Self Care)   []  All codes above (97110 - 97535)  []  97012 (Mechanical Traction)  [x]  97014 (E-stim Unattended)  []  97032 (E-stim manual)  []  97033 (Ionto)  []  97035 (Ultrasound) [x]  97750 (Physical Performance Training) []  [id number] (Aquatic Therapy) [x]  97016 (Vasopneumatic Device) []  [id number] (Paraffin) []  97034 (Contrast Bath) []  97597 (Wound Care 1st 20 sq cm) []  97598 (Wound Care each add'l 20 sq cm) []  97760 (Orthotic Fabrication, Fitting, Training Initial) []  [id number] (Prosthetic Management and Training Initial) []  [id number] (Orthotic or Prosthetic Training/ Modification Subsequent)  END OF SESSION:  PT End of Session - 12/21/22 1155     Visit Number 2    Date for PT Re-Evaluation 02/04/23    Authorization Type [employer]    Authorization Time Period 12/10/22 to 02/04/23    Progress Note Due on Visit 10    PT Start Time 1015    PT Stop Time 1102    PT Time Calculation (min) 47 min    Activity Tolerance  Patient tolerated treatment well;No increased pain    Behavior During Therapy [facility] for tasks assessed/performed              Past Medical History:  Diagnosis Date   AKI (acute kidney injury) (HCC)    Anemia, iron deficiency    Anxiety and depression 05/18/2015   CAD (coronary artery disease), native coronary artery 01/11/2018   s/p CABG - Non-STEMI with severe three-vessel disease noted July 2019   Depression    Diabetic gastroparesis (HCC)    Diabetic neuropathy, type I diabetes mellitus (HCC) 05/18/2015   DKA, type 1 (HCC) 03/18/2016   Essential hypertension    Gastroparesis    GERD (gastroesophageal reflux disease)    HLD (hyperlipidemia)    MDD (major depressive disorder), single episode, severe , no psychosis (HCC)    Tardive dyskinesia    Vitamin B12 deficiency 08/16/2015   Past Surgical History:  Procedure Laterality Date   CARDIAC SURGERY     COLONOSCOPY  09/27/2014   at [facility]   CORONARY ARTERY BYPASS GRAFT N/A 01/14/2018   Procedure: CORONARY ARTERY BYPASS GRAFTING (CABG)X3, RIGHT AND LEFT SAPHENOUS VEIN HARVEST, MAMMARY TAKE DOWN. MAMMARY TO LAD, SVG TO PD, SVG TO DISTAL CIRC.;  Surgeon: Delight Ovens, MD;  Location: [facility] OR;  Service: Open Heart Surgery;  Laterality: N/A;   ESOPHAGOGASTRODUODENOSCOPY  09/27/2014  at [facility], Dr Vashti Hey. biospy neg for celiac, neg for H pylori.    EYE SURGERY     gailstones     INTRAMEDULLARY (IM) NAIL INTERTROCHANTERIC Right 05/10/2019   Procedure: INTRAMEDULLARY (IM) NAIL INTERTROCHANTRIC;  Surgeon: Lyndle Herrlich, MD;  Location: [facility] ORS;  Service: Orthopedics;  Laterality: Right;   IR FLUORO GUIDE CV LINE RIGHT  02/01/2017   IR FLUORO GUIDE CV LINE RIGHT  03/06/2017   IR FLUORO GUIDE CV LINE RIGHT  03/25/2017   IR GENERIC HISTORICAL  01/24/2016   IR FLUORO GUIDE CV LINE RIGHT 01/24/2016 Darrell K Allred, PA-C [facility]-INTERV RAD   IR GENERIC HISTORICAL  01/24/2016   IR US GUIDE VASC ACCESS RIGHT 01/24/2016 Darrell K Allred, PA-C  [facility]INTER[facility] RAD   IR US GUIDE VASC ACCESS RIGHT  02/01/2017   IR US GUIDE VASC ACCESS RIGHT  03/06/2017   IR US GUIDE VASC ACCESS RIGHT  03/25/2017   IR US GUIDE VASC ACCESS RIGHT  01/20/2019   IR VENIPUNCTURE 48YRS/OLDER BY MD  01/20/2019   LEFT HEART CATH AND CORONARY ANGIOGRAPHY N/A 01/07/2018   Procedure: LEFT HEART CATH AND CORONARY ANGIOGRAPHY;  Surgeon: Lennette Bihari, MD;  Location: [facility] INVASIVE CV LAB;  Service: Cardiovascular;  Laterality: N/A;   POSTERIOR VITRECTOMY AND MEMBRANE PEEL-LEFT EYE  09/28/2002   POSTERIOR VITRECTOMY AND MEMBRANE PEEL-RIGHT EYE  03/16/2002   RETINAL DETACHMENT SURGERY     TEE WITHOUT CARDIOVERSION N/A 01/14/2018   Procedure: TRANSESOPHAGEAL ECHOCARDIOGRAM (TEE);  Surgeon: Delight Ovens, MD;  Location: [facility] OR;  Service: Open Heart Surgery;  Laterality: N/A;   Patient Active Problem List   Diagnosis Date Noted   Pressure injury of skin 10/05/2021   Achilles tendon contracture, right 11/30/2020   Closed fracture of proximal end of femur, right, initial encounter (HCC) 05/09/2019   Body mass index (BMI) 30.0-30.9, adult 04/26/2019   Hypertensive chronic kidney disease w stg 1-4/unsp chr kdny 04/26/2019   Obesity, unspecified 04/26/2019   CAD (coronary artery disease) 03/06/2019   Acute encephalopathy 12/22/2018   Presence of aortocoronary bypass graft 06/25/2018   Diabetic ketoacidosis (HCC) 04/30/2018   Gastroparesis 04/13/2018   DKA (diabetic ketoacidosis) (HCC) 03/31/2018   S/P CABG x 3 01/14/2018   CAD (coronary artery disease), native coronary artery 01/11/2018   Dehydration    MDD (major depressive disorder), single episode, severe , no psychosis (HCC)    DKA (diabetic ketoacidoses) 06/16/2017   Tardive dyskinesia    Type 1 diabetes mellitus with hyperlipidemia (HCC) 03/18/2016   Noncompliance with treatment plan 03/13/2016   Essential hypertension 01/24/2016   HLD (hyperlipidemia)    GERD (gastroesophageal reflux disease)    AKI (acute kidney  injury) (HCC)    Anemia, iron deficiency    Vitamin B12 deficiency 08/16/2015   Diabetic gastroparesis (HCC)    Diabetic neuropathy, type I diabetes mellitus (HCC) 05/18/2015   Anxiety and depression 05/18/2015    PCP: Hoy Register, MD  REFERRING PROVIDER: Gershon Mussel, MD  REFERRING DIAG: adhesive capsulitis of Lt shoulder   THERAPY DIAG:  Stiffness of left shoulder, not elsewhere classified  Left shoulder pain, unspecified chronicity  Rationale for Evaluation and Treatment: Rehabilitation  ONSET DATE: approximately 1 month ago  SUBJECTIVE:  SUBJECTIVE STATEMENT: Bona notes early HEP compliance.  She also notes symptomatic progress after Dr. Shon Baton gave her a cortisone shot.  PERTINENT HISTORY: DM-uncontrolled  Stiffness of Rt shoulder  PAIN:  Are you having pain? Yes: NPRS scale: 3-10/10 this week Pain location: Lt shoulder, neck, upper arm Pain description: ache, stiff Aggravating factors: using the arm, sleeping on Lt side Relieving factors: heat, tylenol and tramadol  PRECAUTIONS: None  WEIGHT BEARING RESTRICTIONS: No  FALLS:  Has patient fallen in last 6 months? No  LIVING ENVIRONMENT: Lives with: lives alone Lives in: House/apartment Stairs:  Has following equipment at home:  walker- 3 wheeled rollator  OCCUPATION:  On disability   PLOF: Independent  PATIENT GOALS: wash hair and just be able to sit without pain  NEXT MD VISIT: none for now   OBJECTIVE:   DIAGNOSTIC FINDINGS:    PATIENT SURVEYS:  FOTO 50  COGNITION: Overall cognitive status: Within functional limits for tasks assessed     SENSATION: Not tested  POSTURE: Rounded shoulders/forward head  UPPER EXTREMITY ROM:   Active ROM Right eval Left eval Left/Right 12/21/2022  Shoulder flexion 85 85  110/115  Shoulder extension  10   Shoulder abduction  80   Shoulder adduction     Shoulder internal rotation RBB: L5 RBB: L5 30/35  Shoulder external rotation RBH: base of skull RBH: to ear 30/35  Elbow flexion     Elbow extension     Wrist flexion     Wrist extension     Wrist ulnar deviation     Wrist radial deviation     Wrist pronation     Wrist supination     (Blank rows = not tested)  UPPER EXTREMITY MMT:  MMT Right eval Left eval  Shoulder flexion 5 5  Shoulder extension    Shoulder abduction 5 4 (+) pain  Shoulder adduction    Shoulder internal rotation  5  Shoulder external rotation  4  Middle trapezius    Lower trapezius    Elbow flexion 5 5  Elbow extension 5 5  Wrist flexion    Wrist extension    Wrist ulnar deviation    Wrist radial deviation    Wrist pronation    Wrist supination    Grip strength (lbs)    (Blank rows = not tested)  SHOULDER SPECIAL TESTS: Impingement tests:  SLAP lesions:  Instability tests:  Rotator cuff assessment:  Biceps assessment:   JOINT MOBILITY TESTING:  Hypomobile Lt GH joint   PALPATION:  Muscle tension/spasm posterior shoulder, upper trap, deltiod, biceps   TODAY'S TREATMENT:                                                                                                                                         DATE:  12/21/2022 Posterior capsule stretch 5 x 10 seconds  Shoulder rolls 10 x 5 seconds Table  slides flexion, thumb pointing up 10 x 10 seconds (use wash cloth to help slide) Shoulder blade pinches 10 x 5 seconds Supine IR stretch (@ 70 degrees abduction, elbow on 3 pillos at or above shoulder height) 20 x 10 seconds Supine ER stretch (@ 70 degrees abduction, elbow on 3 pillos at or above shoulder height) 20 x 10 seconds Supine arm raises 20 x 3 seconds Supine shoulder flexion 20 x 10 seconds (palms facing in, protract 1st)   Eval HEP review: see below Self massage with tennis ball   PATIENT  EDUCATION: Education details: eval findings/POC; implemented HEP Person educated: Patient Education method: Explanation, Demonstration, and Handouts Education comprehension: verbalized understanding and returned demonstration  HOME EXERCISE PROGRAM: Access Code: [email or web address] URL: [email or web address] Date: 12/10/2022 Prepared by: [name] - [facility] Re[facility] - [facility]  Exercises - Standing Shoulder Posterior Capsule Stretch  - 3 x daily - 7 x weekly - 2 reps - 30 seconds hold - Shoulder Rolls in Sitting  - 3 x daily - 7 x weekly - 2 sets - 10 reps - Seated Shoulder Flexion Towel Slide at Table Top  - 3 x daily - 7 x weekly - 1 sets - 10 reps - 10seconds hold  ASSESSMENT:  CLINICAL IMPRESSION: Laurabelle is moving better and feeling better as compared to evaluation.  She did note some difficulty with table slides at home due to a high table so I gave her a supine activity to address shoulder flexion.  Same with shoulder rolls, some crepitus noted so we switched to scapular retraction.  Otherwise, today's visit focused on improving capsular flexibility and shoulder active range of motion.  Vallerie's home exercise program was updated and she expressed confidence with her ability to complete this program at home.  OBJECTIVE IMPAIRMENTS: decreased activity tolerance, decreased knowledge of condition, decreased mobility, decreased ROM, decreased strength, hypomobility, increased muscle spasms, impaired UE functional use, improper body mechanics, and pain.   ACTIVITY LIMITATIONS: carrying, lifting, sleeping, bathing, toileting, dressing, reach over head, and hygiene/grooming  PARTICIPATION LIMITATIONS: cleaning, laundry, interpersonal relationship, community activity, and yard work  PERSONAL FACTORS: Education, Financial risk analyst, Past/current experiences, Sex, Time since onset of injury/illness/exacerbation, and 1 comorbidity: poorly controlled diabetes  are also affecting  patient's functional outcome.   REHAB POTENTIAL: Fair considering pt's poorly controlled diabetes and understanding of the diagnosis  CLINICAL DECISION MAKING: Evolving/moderate complexity  EVALUATION COMPLEXITY: Moderate   GOALS: Goals reviewed with patient? Yes  SHORT TERM GOALS: Target date: 12/15/22  Pt will be independent and consistent with her initial HEP to improve shoulder ROM. Baseline: Goal status: On Going 12/21/2022   LONG TERM GOALS: Target date: 02/04/23  Pt will have increase in Lt shoulder flexion A/ROM to atleast 120 deg which will improve her ability to reach overhead into the cabinet.  Baseline:  Goal status: INITIAL  2.  Pt will report 50% improvement in sleep quality throughout the week, secondary to improvements in Lt shoulder pain.  Baseline:  Goal status: INITIAL  3.  Pt will have Lt UE reach to T2 which will allow her to wash her hair independently.  Baseline:  Goal status: INITIAL  4.  Pt will have good awareness of scapula mechanics, evident by her ability to self correct shoulder shrug throughout her session.  Baseline:  Goal status: INITIAL  5.  Pt FOTO will improve to 60, reflecting an improvement in shoulder function from the start of PT. Baseline:  Goal status: INITIAL  6.  Pt will  have improvement in shoulder internal and external rotation by atleast 10 degrees each direction which will improve her ability to don/doff clothing without assistance. Baseline:  Goal status: INITIAL  PLAN:  PT FREQUENCY: 1-2x/week  PT DURATION: 8 weeks  PLANNED INTERVENTIONS: Therapeutic exercises, Therapeutic activity, Neuromuscular re-education, Balance training, Gait training, Patient/Family education, Self Care, Joint mobilization, Dry Needling, Cryotherapy, Moist heat, Taping, Manual therapy, and Re-evaluation  PLAN FOR NEXT SESSION: Capsular range, flexibility emphasis    12:06 PM,12/21/22 Cherlyn Cushing PT, MPT   PHYSICAL THERAPY DISCHARGE  SUMMARY  Visits from Start of Care: 2  Current functional level related to goals / functional outcomes: See note   Remaining deficits: See note   Education / Equipment: HEP  Patient goals were not met. Patient is being discharged due to not returning since the last visit.  Chyrel Masson, PT, DPT, OCS, ATC 02/13/23  9:58 AM

## 2022-12-26 ENCOUNTER — Ambulatory Visit: Payer: Medicare HMO | Attending: Family Medicine

## 2022-12-26 VITALS — Ht 61.0 in | Wt 177.0 lb

## 2022-12-26 DIAGNOSIS — Z0000 Encounter for general adult medical examination without abnormal findings: Secondary | ICD-10-CM | POA: Diagnosis not present

## 2022-12-26 NOTE — Progress Notes (Signed)
Subjective:   [name] is a 57 y.o. female who presents for Medicare Annual (Subsequent) preventive examination.  Visit Complete: Virtual  I connected with  Dwain Sarna on 12/26/22 by a audio enabled telemedicine application and verified that I am speaking with the correct person using two identifiers.  Patient Location: Home  Provider Location: Home Office  I discussed the limitations of evaluation and management by telemedicine. The patient expressed understanding and agreed to proceed.  Review of Systems     Cardiac Risk Factors include: diabetes mellitus;hypertension;dyslipidemia;sedentary lifestyle;obesity (BMI >30kg/m2)     Objective:    Today's Vitals   12/26/22 1330  Weight: 177 lb (80.3 kg)  Height: 5\' 1"  (1.549 m)   Body mass index is 33.44 kg/m.     12/26/2022    6:17 PM 12/10/2022   11:05 AM 02/13/2022    4:17 PM 10/03/2021    4:24 PM 11/04/2019    9:44 AM 05/10/2019    4:00 AM 05/09/2019    8:21 PM  Advanced Directives  Does Patient Have a Medical Advance Directive? No No No No No No No  Would patient like information on creating a medical advance directive? Yes (MAU/Ambulatory/Procedural Areas - Information given) No - Patient declined Yes (ED - Information included in AVS) No - Patient declined No - Patient declined No - Patient declined     Current Medications (verified) Outpatient Encounter Medications as of 12/26/2022  Medication Sig   acetaminophen (TYLENOL) 500 MG tablet Take 1,000 mg by mouth at bedtime as needed for mild pain.   aspirin EC 81 MG tablet Take 1 tablet (81 mg total) by mouth daily.   furosemide (LASIX) 40 MG tablet Take 1 tablet (40 mg total) by mouth daily.   insulin aspart (NOVOLOG) 100 UNIT/ML injection Inject 7 Units into the skin 3 (three) times daily before meals. (Patient taking differently: Inject 12-14 Units into the skin 3 (three) times daily before meals. Sliding scale)   insulin glargine (LANTUS) 100 UNIT/ML injection  Lantus 9 units in pm   Insulin Syringe-Needle U-100 (BD INSULIN SYRINGE U/F) 31G X 5/16" 0.5 ML MISC USE FOUR TIMES DAILY FOR INSULIN   Insulin Syringes, Disposable, U-100 0.3 ML MISC Use to draw up insulin as instructed   losartan (COZAAR) 25 MG tablet Take 1 tablet (25 mg total) by mouth daily. Hold this medication if your systolic blood pressure ( top number) s less than 100. Please call your doctor is your systolic blood pressure is less than 100   metoCLOPramide (REGLAN) 10 MG tablet TAKE 1 TABLET(10 MG) BY MOUTH THREE TIMES DAILY BEFORE MEALS   metoprolol tartrate (LOPRESSOR) 25 MG tablet Take 0.5 tablets (12.5 mg total) by mouth 2 (two) times daily. Please check your blood pressure twice a day, please hold this medication if your systolic blood pressure ( top number) is less than 100, please call your doctor if your systolic blood pressure is less than 100.   ondansetron (ZOFRAN) 4 MG tablet Take 1 tablet (4 mg total) by mouth every 8 (eight) hours as needed for nausea or vomiting.   pregabalin (LYRICA) 150 MG capsule Take 1 capsule (150 mg total) by mouth 2 (two) times daily.   rosuvastatin (CRESTOR) 20 MG tablet Take 1 tablet (20 mg total) by mouth daily.   silver sulfADIAZINE (SILVADENE) 1 % cream Apply 1 Application topically daily.   traMADol (ULTRAM) 50 MG tablet Take 1 tablet (50 mg total) by mouth 2 (two) times daily as  needed.   insulin aspart (NOVOLOG) 100 UNIT/ML injection 100 units in pump daily (Patient not taking: Reported on 12/26/2022)   Insulin Disposable Pump (OMNIPOD DASH PODS, GEN 4,) MISC SUB-Q Every Other Day (Patient not taking: Reported on 07/04/2022)   [DISCONTINUED] sulfamethoxazole-trimethoprim (BACTRIM DS) 800-160 MG tablet Take 1 tablet by mouth 2 (two) times daily. (Patient not taking: Reported on 12/26/2022)   No facility-administered encounter medications on file as of 12/26/2022.    Allergies (verified) Anesthetics, amide; Chlorhexidine; Penicillins; Enkaid  [encainide]; and Subutex [buprenorphine]   History: Past Medical History:  Diagnosis Date   AKI (acute kidney injury) (HCC)    Anemia, iron deficiency    Anxiety and depression 05/18/2015   CAD (coronary artery disease), native coronary artery 01/11/2018   s/p CABG - Non-STEMI with severe three-vessel disease noted July 2019   Depression    Diabetic gastroparesis (HCC)    Diabetic neuropathy, type I diabetes mellitus (HCC) 05/18/2015   DKA, type 1 (HCC) 03/18/2016   Essential hypertension    Gastroparesis    GERD (gastroesophageal reflux disease)    HLD (hyperlipidemia)    MDD (major depressive disorder), single episode, severe , no psychosis (HCC)    Tardive dyskinesia    Vitamin B12 deficiency 08/16/2015   Past Surgical History:  Procedure Laterality Date   CARDIAC SURGERY     COLONOSCOPY  09/27/2014   at [facility]   CORONARY ARTERY BYPASS GRAFT N/A 01/14/2018   Procedure: CORONARY ARTERY BYPASS GRAFTING (CABG)X3, RIGHT AND LEFT SAPHENOUS VEIN HARVEST, MAMMARY TAKE DOWN. MAMMARY TO LAD, SVG TO PD, SVG TO DISTAL CIRC.;  Surgeon: Delight Ovens, MD;  Location: [facility] OR;  Service: Open Heart Surgery;  Laterality: N/A;   ESOPHAGOGASTRODUODENOSCOPY  09/27/2014   at [facility], Dr Vashti Hey. biospy neg for celiac, neg for H pylori.    EYE SURGERY     gailstones     INTRAMEDULLARY (IM) NAIL INTERTROCHANTERIC Right 05/10/2019   Procedure: INTRAMEDULLARY (IM) NAIL INTERTROCHANTRIC;  Surgeon: Lyndle Herrlich, MD;  Location: [facility] ORS;  Service: Orthopedics;  Laterality: Right;   IR FLUORO GUIDE CV LINE RIGHT  02/01/2017   IR FLUORO GUIDE CV LINE RIGHT  03/06/2017   IR FLUORO GUIDE CV LINE RIGHT  03/25/2017   IR GENERIC HISTORICAL  01/24/2016   IR FLUORO GUIDE CV LINE RIGHT 01/24/2016 Darrell K Allred, PA-C [facility]-INTERV RAD   IR GENERIC HISTORICAL  01/24/2016   IR US GUIDE VASC ACCESS RIGHT 01/24/2016 Darrell K Allred, PA-C [facility]   IR US GUIDE VASC ACCESS RIGHT  02/01/2017   IR US GUIDE VASC ACCESS  RIGHT  03/06/2017   IR US GUIDE VASC ACCESS RIGHT  03/25/2017   IR US GUIDE VASC ACCESS RIGHT  01/20/2019   IR VENIPUNCTURE 68YRS/OLDER BY MD  01/20/2019   LEFT HEART CATH AND CORONARY ANGIOGRAPHY N/A 01/07/2018   Procedure: LEFT HEART CATH AND CORONARY ANGIOGRAPHY;  Surgeon: Lennette Bihari, MD;  Location: [facility] INVASIVE CV LAB;  Service: Cardiovascular;  Laterality: N/A;   POSTERIOR VITRECTOMY AND MEMBRANE PEEL-LEFT EYE  09/28/2002   POSTERIOR VITRECTOMY AND MEMBRANE PEEL-RIGHT EYE  03/16/2002   RETINAL DETACHMENT SURGERY     TEE WITHOUT CARDIOVERSION N/A 01/14/2018   Procedure: TRANSESOPHAGEAL ECHOCARDIOGRAM (TEE);  Surgeon: Delight Ovens, MD;  Location: [facility] OR;  Service: Open Heart Surgery;  Laterality: N/A;   Family History  Problem Relation Age of Onset   Cystic fibrosis Mother    Hypertension Father    Diabetes Brother  Hypertension Maternal Grandmother    Social History   Socioeconomic History   Marital status: Single    Spouse name: Not on file   Number of children: 1   Years of education: Not on file   Highest education level: Not on file  Occupational History   Occupation: Unemployed  Tobacco Use   Smoking status: Never   Smokeless tobacco: Never  Vaping Use   Vaping Use: Never used  Substance and Sexual Activity   Alcohol use: No   Drug use: Yes    Types: Marijuana    Comment: occ   Sexual activity: Yes    Partners: Male    Birth control/protection: None  Other Topics Concern   Not on file  Social History Narrative   Not on file   Social Determinants of Health   Financial Resource Strain: Low Risk  (12/26/2022)   Overall Financial Resource Strain (CARDIA)    Difficulty of Paying Living Expenses: Not hard at all  Food Insecurity: No Food Insecurity (12/26/2022)   Hunger Vital Sign    Worried About Running Out of Food in the Last Year: Never true    Ran Out of Food in the Last Year: Never true  Transportation Needs: No Transportation Needs (12/26/2022)   PRAPARE  - Administrator, Civil Service (Medical): No    Lack of Transportation (Non-Medical): No  Physical Activity: Inactive (12/26/2022)   Exercise Vital Sign    Days of Exercise per Week: 0 days    Minutes of Exercise per Session: 0 min  Stress: No Stress Concern Present (12/26/2022)   [employer] of Occupational Health - Occupational Stress Questionnaire    Feeling of Stress : Only a little  Social Connections: Moderately Isolated (12/26/2022)   Social Connection and Isolation Panel [NHANES]    Frequency of Communication with Friends and Family: More than three times a week    Frequency of Social Gatherings with Friends and Family: Three times a week    Attends Religious Services: 1 to 4 times per year    Active Member of Clubs or Organizations: No    Attends Banker Meetings: Never    Marital Status: Never married    Tobacco Counseling Counseling given: Not Answered   Clinical Intake:  Pre-visit preparation completed: Yes  Pain : No/denies pain     Diabetes: Yes CBG done?: No Did pt. bring in CBG monitor from home?: No  How often do you need to have someone help you when you read instructions, pamphlets, or other written materials from your doctor or pharmacy?: 1 - Never  Interpreter Needed?: No  Information entered by :: Kandis Fantasia LPN   Activities of Daily Living    12/26/2022    6:17 PM 02/13/2022    4:18 PM  In your present state of health, do you have any difficulty performing the following activities:  Hearing? 0 0  Vision? 0 0  Difficulty concentrating or making decisions? 0 0  Walking or climbing stairs? 0 1  Dressing or bathing? 0 0  Doing errands, shopping? 0 1  Preparing Food and eating ? N N  Using the Toilet? N N  In the past six months, have you accidently leaked urine? N N  Do you have problems with loss of bowel control? N N  Managing your Medications? N N  Managing your Finances? N N  Housekeeping or managing your  Housekeeping? N N    Patient Care Team: Hoy Register,  MD as PCP - General (Family Medicine) Nahser, Deloris Ping, MD as PCP - Cardiology (Cardiology)  Indicate any recent Medical Services you may have received from other than [facility] providers in the past year (date may be approximate).     Assessment:   This is a routine wellness examination for Ashayla.  Hearing/Vision screen Hearing Screening - Comments:: Denies hearing difficulties   Vision Screening - Comments:: No vision problems; will schedule routine eye exam soon    Dietary issues and exercise activities discussed:     Goals Addressed             This Visit's Progress    Remain active and independent         Depression Screen    12/26/2022    6:15 PM 10/31/2022    4:33 PM 02/13/2022    4:18 PM 09/06/2021   10:04 AM 05/06/2020    2:26 PM 02/15/2020   10:28 AM 11/03/2019   11:55 AM  PHQ 2/9 Scores  PHQ - 2 Score 2 2 0 0 0 2 2  PHQ- 9 Score 4 4  2  10 7     Fall Risk    12/26/2022    6:17 PM 02/13/2022    4:17 PM 09/06/2021    9:59 AM 05/06/2020    2:26 PM 08/05/2019    9:35 AM  Fall Risk   Falls in the past year? 0 0 0 1 1  Number falls in past yr: 0 0 0 0 0  Injury with Fall? 0 0 0 1 1  Risk for fall due to : No Fall Risks History of fall(s)     Follow up Falls prevention discussed;Education provided;Falls evaluation completed Education provided       MEDICARE RISK AT HOME:  Medicare Risk at Home - 12/26/22 1817     Any stairs in or around the home? No    If so, are there any without handrails? No    Home free of loose throw rugs in walkways, pet beds, electrical cords, etc? Yes    Adequate lighting in your home to reduce risk of falls? Yes    Life alert? No    Use of a cane, walker or w/c? No    Grab bars in the bathroom? Yes    Shower chair or bench in shower? No    Elevated toilet seat or a handicapped toilet? No             TIMED UP AND GO:  Was the test performed?  No    Cognitive  Function:    02/13/2022    4:22 PM  MMSE - Mini Mental State Exam  Not completed: Unable to complete        12/26/2022    6:17 PM 02/13/2022    4:22 PM  6CIT Screen  What Year? 0 points 0 points  What month? 0 points 0 points  What time? 0 points 0 points  Count back from 20 0 points 0 points  Months in reverse 0 points 0 points  Repeat phrase 0 points 0 points  Total Score 0 points 0 points    Immunizations Immunization History  Administered Date(s) Administered   Influenza Split 03/25/2012   Influenza, Quadrivalent, Recombinant, Inj, Pf 04/09/2015, 04/07/2016, 04/03/2017, 04/15/2018, 04/26/2020   Influenza, Seasonal, Injecte, Preservative Fre 04/08/2014   Influenza,inj,Quad PF,6+ Mos 04/09/2015, 04/07/2016, 04/03/2017, 04/15/2018, 04/26/2020   Influenza-Unspecified 03/30/2015   PFIZER Comirnaty(Gray Top)Covid-19 Tri-Sucrose Vaccine 09/30/2020   PFIZER(Purple Top)SARS-COV-2  Vaccination 03/14/2020, 04/04/2020   PPD Test 04/04/2017   Pneumococcal-Unspecified 04/29/2014    TDAP status: Due, Education has been provided regarding the importance of this vaccine. Advised may receive this vaccine at local pharmacy or Health Dept. Aware to provide a copy of the vaccination record if obtained from local pharmacy or Health Dept. Verbalized acceptance and understanding.  Pneumococcal vaccine status: Up to date  Covid-19 vaccine status: Information provided on how to obtain vaccines.   Qualifies for Shingles Vaccine? Yes   Zostavax completed No   Shingrix Completed?: No.    Education has been provided regarding the importance of this vaccine. Patient has been advised to call insurance company to determine out of pocket expense if they have not yet received this vaccine. Advised may also receive vaccine at local pharmacy or Health Dept. Verbalized acceptance and understanding.  Screening Tests Health Maintenance  Topic Date Due   DTaP/Tdap/Td (1 - Tdap) Never done   MAMMOGRAM  Never  done   OPHTHALMOLOGY EXAM  05/31/2021   Diabetic kidney evaluation - Urine ACR  01/03/2023   PAP SMEAR-Modifier  12/26/2022 (Originally 11/04/2021)   COVID-19 Vaccine (4 - 2023-24 season) 01/11/2023 (Originally 02/23/2022)   FOOT EXAM  02/22/2023 (Originally 08/16/2022)   Zoster Vaccines- Shingrix (1 of 2) 03/28/2023 (Originally 05/17/2017)   Colonoscopy  12/26/2023 (Originally 05/17/2012)   Hepatitis C Screening  12/26/2023 (Originally [birth year]/1985)   HEMOGLOBIN A1C  01/02/2023   INFLUENZA VACCINE  01/24/2023   Diabetic kidney evaluation - eGFR measurement  07/05/2023   Medicare Annual Wellness (AWV)  12/26/2023   HIV Screening  Completed   HPV VACCINES  Aged Out    Health Maintenance  Health Maintenance Due  Topic Date Due   DTaP/Tdap/Td (1 - Tdap) Never done   MAMMOGRAM  Never done   OPHTHALMOLOGY EXAM  05/31/2021   Diabetic kidney evaluation - Urine ACR  01/03/2023    Colorectal cancer screening:  Patient declines at this time   Mammogram status: Ordered 10/31/22. Pt provided with contact info and advised to call to schedule appt.   Lung Cancer Screening: (Low Dose CT Chest recommended if Age 54-80 years, 20 pack-year currently smoking OR have quit w/in 15years.) does not qualify.   Lung Cancer Screening Referral: n/a  Additional Screening:  Hepatitis C Screening: does qualify;  Vision Screening: Recommended annual ophthalmology exams for early detection of glaucoma and other disorders of the eye. Is the patient up to date with their annual eye exam?  No  Who is the provider or what is the name of the office in which the patient attends annual eye exams? None  If pt is not established with a provider, would they like to be referred to a provider to establish care? No .   Dental Screening: Recommended annual dental exams for proper oral hygiene  Diabetic Foot Exam: Diabetic Foot Exam: Overdue, Pt has been advised about the importance in completing this exam. Pt is scheduled  for diabetic foot exam on at next office visit .  Community Resource Referral / Chronic Care Management: CRR required this visit?  No   CCM required this visit?  No     Plan:     I have personally reviewed and noted the following in the patient's chart:   Medical and social history Use of alcohol, tobacco or illicit drugs  Current medications and supplements including opioid prescriptions. Patient is currently taking opioid prescriptions. Information provided to patient regarding non-opioid alternatives. Patient advised to discuss non-opioid  treatment plan with their provider. Functional ability and status Nutritional status Physical activity Advanced directives List of other physicians Hospitalizations, surgeries, and ER visits in previous 12 months Vitals Screenings to include cognitive, depression, and falls Referrals and appointments  In addition, I have reviewed and discussed with patient certain preventive protocols, quality metrics, and best practice recommendations. A written personalized care plan for preventive services as well as general preventive health recommendations were provided to patient.     Kandis Fantasia [place], [place]   06/30/1094   After Visit Summary: (Mail) Due to this being a telephonic visit, the after visit summary with patients personalized plan was offered to patient via mail   Nurse Notes: Patient has multiple concerns; see telephone note

## 2022-12-26 NOTE — Patient Instructions (Addendum)
[name] , Thank you for taking time to come for your Medicare Wellness Visit. I appreciate your ongoing commitment to your health goals. Please review the following plan we discussed and let me know if I can assist you in the future.   These are the goals we discussed:  Goals      HEMOGLOBIN A1C < 7.0     Remain active and independent        This is a list of the screening recommended for you and due dates:  Health Maintenance  Topic Date Due   DTaP/Tdap/Td vaccine (1 - Tdap) Never done   Mammogram  Never done   Eye exam for diabetics  05/31/2021   Pap Smear  11/04/2021   COVID-19 Vaccine (4 - 2023-24 season) 02/23/2022   Complete foot exam   08/16/2022   Yearly kidney health urinalysis for diabetes  01/03/2023   Zoster (Shingles) Vaccine (1 of 2) 03/28/2023*   Colon Cancer Screening  12/26/2023*   Hepatitis C Screening  12/26/2023*   Hemoglobin A1C  01/02/2023   Flu Shot  01/24/2023   Yearly kidney function blood test for diabetes  07/05/2023   Medicare Annual Wellness Visit  12/26/2023   HIV Screening  Completed   HPV Vaccine  Aged Out  *Topic was postponed. The date shown is not the original due date.    Advanced directives: Information on Advanced Care Planning can be found at [facility] Advance Health Care Directives Advance Health Care Directives ([email or web address] Please bring a copy of your health care power of attorney and living will to the office to be added to your chart at your convenience.   Conditions/risks identified: Aim for 30 minutes of exercise or brisk walking, 6-8 glasses of water, and 5 servings of fruits and vegetables each day.   Next appointment: Follow up in one year for your annual wellness visit   Preventive Care 40-64 Years, Female Preventive care refers to lifestyle choices and visits with your health care provider that can promote health and wellness. What does preventive care include? A yearly physical exam. This is also  called an annual well check. Dental exams once or twice a year. Routine eye exams. Ask your health care provider how often you should have your eyes checked. Personal lifestyle choices, including: Daily care of your teeth and gums. Regular physical activity. Eating a healthy diet. Avoiding tobacco and drug use. Limiting alcohol use. Practicing safe sex. Taking low-dose aspirin every day starting at age 54. What happens during an annual well check? The services and screenings done by your health care provider during your annual well check will depend on your age, overall health, lifestyle risk factors, and family history of disease. Counseling  Your health care provider may ask you questions about your: Alcohol use. Tobacco use. Drug use. Emotional well-being. Home and relationship well-being. Sexual activity. Eating habits. Work and work Astronomer. Screening  You may have the following tests or measurements: Height, weight, and BMI. Blood pressure. Lipid and cholesterol levels. These may be checked every 5 years, or more frequently if you are over 79 years old. Skin check. Lung cancer screening. You may have this screening every year starting at age 86 if you have a 30-pack-year history of smoking and currently smoke or have quit within the past 15 years. Fecal occult blood test (FOBT) of the stool. You may have this test every year starting at age 88. Flexible sigmoidoscopy or colonoscopy. You may have a  sigmoidoscopy every 5 years or a colonoscopy every 10 years starting at age 53. Prostate cancer screening. Recommendations will vary depending on your family history and other risks. Hepatitis C blood test. Hepatitis B blood test. Sexually transmitted disease (STD) testing. Diabetes screening. This is done by checking your blood sugar (glucose) after you have not eaten for a while (fasting). You may have this done every 1-3 years. Discuss your test results, treatment options,  and if necessary, the need for more tests with your health care provider. Vaccines  Your health care provider may recommend certain vaccines, such as: Influenza vaccine. This is recommended every year. Tetanus, diphtheria, and acellular pertussis (Tdap, Td) vaccine. You may need a Td booster every 10 years. Zoster vaccine. You may need this after age 74. Pneumococcal 13-valent conjugate (PCV13) vaccine. You may need this if you have certain conditions and have not been vaccinated. Pneumococcal polysaccharide (PPSV23) vaccine. You may need one or two doses if you smoke cigarettes or if you have certain conditions. Talk to your health care provider about which screenings and vaccines you need and how often you need them. This information is not intended to replace advice given to you by your health care provider. Make sure you discuss any questions you have with your health care provider. Document Released: 07/08/2015 Document Revised: 02/29/2016 Document Reviewed: 04/12/2015 Elsevier Interactive Patient Education  2017 [employer].  Fall Prevention in the Home Falls can cause injuries. They can happen to people of all ages. There are many things you can do to make your home safe and to help prevent falls. What can I do on the outside of my home? Regularly fix the edges of walkways and driveways and fix any cracks. Remove anything that might make you trip as you walk through a door, such as a raised step or threshold. Trim any bushes or trees on the path to your home. Use bright outdoor lighting. Clear any walking paths of anything that might make someone trip, such as rocks or tools. Regularly check to see if handrails are loose or broken. Make sure that both sides of any steps have handrails. Any raised decks and porches should have guardrails on the edges. Have any leaves, snow, or ice cleared regularly. Use sand or salt on walking paths during winter. Clean up any spills in your garage  right away. This includes oil or grease spills. What can I do in the bathroom? Use night lights. Install grab bars by the toilet and in the tub and shower. Do not use towel bars as grab bars. Use non-skid mats or decals in the tub or shower. If you need to sit down in the shower, use a plastic, non-slip stool. Keep the floor dry. Clean up any water that spills on the floor as soon as it happens. Remove soap buildup in the tub or shower regularly. Attach bath mats securely with double-sided non-slip rug tape. Do not have throw rugs and other things on the floor that can make you trip. What can I do in the bedroom? Use night lights. Make sure that you have a light by your bed that is easy to reach. Do not use any sheets or blankets that are too big for your bed. They should not hang down onto the floor. Have a firm chair that has side arms. You can use this for support while you get dressed. Do not have throw rugs and other things on the floor that can make you trip. What can  I do in the kitchen? Clean up any spills right away. Avoid walking on wet floors. Keep items that you use a lot in easy-to-reach places. If you need to reach something above you, use a strong step stool that has a grab bar. Keep electrical cords out of the way. Do not use floor polish or wax that makes floors slippery. If you must use wax, use non-skid floor wax. Do not have throw rugs and other things on the floor that can make you trip. What can I do with my stairs? Do not leave any items on the stairs. Make sure that there are handrails on both sides of the stairs and use them. Fix handrails that are broken or loose. Make sure that handrails are as long as the stairways. Check any carpeting to make sure that it is firmly attached to the stairs. Fix any carpet that is loose or worn. Avoid having throw rugs at the top or bottom of the stairs. If you do have throw rugs, attach them to the floor with carpet tape. Make  sure that you have a light switch at the top of the stairs and the bottom of the stairs. If you do not have them, ask someone to add them for you. What else can I do to help prevent falls? Wear shoes that: Do not have high heels. Have rubber bottoms. Are comfortable and fit you well. Are closed at the toe. Do not wear sandals. If you use a stepladder: Make sure that it is fully opened. Do not climb a closed stepladder. Make sure that both sides of the stepladder are locked into place. Ask someone to hold it for you, if possible. Clearly mark and make sure that you can see: Any grab bars or handrails. First and last steps. Where the edge of each step is. Use tools that help you move around (mobility aids) if they are needed. These include: Canes. Walkers. Scooters. Crutches. Turn on the lights when you go into a dark area. Replace any light bulbs as soon as they burn out. Set up your furniture so you have a clear path. Avoid moving your furniture around. If any of your floors are uneven, fix them. If there are any pets around you, be aware of where they are. Review your medicines with your doctor. Some medicines can make you feel dizzy. This can increase your chance of falling. Ask your doctor what other things that you can do to help prevent falls. This information is not intended to replace advice given to you by your health care provider. Make sure you discuss any questions you have with your health care provider. Document Released: 04/07/2009 Document Revised: 11/17/2015 Document Reviewed: 07/16/2014 Elsevier Interactive Patient Education  2017 [employer].

## 2022-12-31 DIAGNOSIS — K30 Functional dyspepsia: Secondary | ICD-10-CM | POA: Diagnosis not present

## 2022-12-31 DIAGNOSIS — Z79899 Other long term (current) drug therapy: Secondary | ICD-10-CM | POA: Diagnosis not present

## 2022-12-31 DIAGNOSIS — R6889 Other general symptoms and signs: Secondary | ICD-10-CM | POA: Diagnosis not present

## 2022-12-31 DIAGNOSIS — K3184 Gastroparesis: Secondary | ICD-10-CM | POA: Diagnosis not present

## 2023-01-01 ENCOUNTER — Other Ambulatory Visit: Payer: Self-pay

## 2023-01-01 ENCOUNTER — Encounter: Payer: Self-pay | Admitting: Sports Medicine

## 2023-01-01 ENCOUNTER — Ambulatory Visit: Payer: Medicare HMO | Admitting: Sports Medicine

## 2023-01-01 DIAGNOSIS — M7502 Adhesive capsulitis of left shoulder: Secondary | ICD-10-CM | POA: Diagnosis not present

## 2023-01-01 DIAGNOSIS — E1069 Type 1 diabetes mellitus with other specified complication: Secondary | ICD-10-CM

## 2023-01-01 DIAGNOSIS — R6889 Other general symptoms and signs: Secondary | ICD-10-CM | POA: Diagnosis not present

## 2023-01-01 DIAGNOSIS — E785 Hyperlipidemia, unspecified: Secondary | ICD-10-CM | POA: Diagnosis not present

## 2023-01-01 MED ORDER — METHYLPREDNISOLONE ACETATE 80 MG/ML IJ SUSP
40.00 mg | INTRAMUSCULAR | Status: AC | PRN
Start: 2023-01-01 — End: 2023-01-01
  Administered 2023-01-01: 40 mg via INTRA_ARTICULAR

## 2023-01-01 MED ORDER — LIDOCAINE HCL 1 % IJ SOLN
3.0000 mL | INTRAMUSCULAR | Status: AC | PRN
Start: 2023-01-01 — End: 2023-01-01
  Administered 2023-01-01: 3 mL

## 2023-01-01 MED ORDER — BUPIVACAINE HCL 0.25 % IJ SOLN
3.00 mL | INTRAMUSCULAR | Status: AC | PRN
Start: 2023-01-01 — End: 2023-01-01
  Administered 2023-01-01: 3 mL via INTRA_ARTICULAR

## 2023-01-01 NOTE — Progress Notes (Signed)
[name] - 57 y.o. female MRN [id number]  Date of birth: 1965/09/29  Office Visit Note: Visit Date: 01/01/2023 PCP: Hoy Register, MD Referred by: Hoy Register, MD  Subjective: Chief Complaint  Patient presents with   Left Shoulder - Pain   HPI: [name] is a pleasant 57 y.o. female who presents today for follow-up of left shoulder adhesive capsulitis.   We did proceed with ultrasound-guided intra-articular glenohumeral joint injection on 12/14/2022.  She did receive fairly good results of pain and improvement in some range of motion following this.  She is performing formalized physical therapy and doing some exercises at home as well.  Has found improvement in both pain and range of motion but is still not 100%.  Feels like she is between 70-80% improved.  Pertinent ROS were reviewed with the patient and found to be negative unless otherwise specified above in HPI.   Assessment & Plan: Visit Diagnoses:  1. Adhesive capsulitis of left shoulder   2. Type 1 diabetes mellitus with hyperlipidemia (HCC)    Plan: Discussed treatment options for her ongoing adhesive capsulitis, this is a decision making proceeded with high-volume glenohumeral joint injection with distention of the capsule.  Allow for 48 hours of modified activity and then will resume both her formalized physical therapy and home therapy.  I reiterated the importance of continuing to stretch out the capsule and performing daily home therapy exercises to improve range of motion.  She will work on tight glucose control as this will help with her improvement as well as prevention of recurrence.  She initially saw Dr. Roda Shutters for this, she will follow-up over the next 4-5 weeks for evaluation of pain.  Given her improvement, we will continue PT and home therapy, could always consider repeat addition of large-volume injection in the future if not continuing to improve but do not think we need this in the short-term. Will  follow-up with Dr. Roda Shutters for next steps.  Follow-up: Return in about 1 month (around 02/01/2023) for with Dr. Roda Shutters for frozen shoulder f/u .   Meds & Orders: No orders of the defined types were placed in this encounter.   Orders Placed This Encounter  Procedures   Large Joint Inj: R glenohumeral   US Guided Needle Placement - No Linked Charges     Procedures: Large Joint Inj: L glenohumeral on 01/01/2023 9:42 AM Indications: pain Details: 22 G 3.5 in needle, ultrasound-guided posterior approach Medications: 3 mL lidocaine 1 %; 3 mL bupivacaine 0.25 %; 40 mg methylPREDNISolone acetate 80 MG/ML (13 mL's of D5W) Outcome: tolerated well, no immediate complications  US-guided glenohumeral joint injection - high-volume injection, left shoulder After discussion on risks/benefits/indications, informed verbal consent was obtained. A timeout was then performed. The patient was positioned lying lateral recumbent on examination table. The patient's shoulder was prepped with betadine and multiple alcohol swabs and utilizing ultrasound guidance, the patient's glenohumeral joint was identified on ultrasound. Using ultrasound guidance a 22-gauge, 3.5 inch needle with a mixture of 13:3:3: 1 cc's D5W:lidocaine:bupivicaine:depomedrol was directed from a lateral to medial direction via in-plane technique into the glenohumeral joint with visualization of appropriate spread of injectate into the joint. Patient tolerated the procedure well without immediate complications.      Procedure, treatment alternatives, risks and benefits explained, specific risks discussed. Consent was given by the patient. Immediately prior to procedure a time out was called to verify the correct patient, procedure, equipment, support staff and site/side marked as required. Patient was prepped  and draped in the usual sterile fashion.          Clinical History: No specialty comments available.  She reports that she has never smoked. She  has never used smokeless tobacco.  Recent Labs    01/02/22 1350 07/04/22 1518  HGBA1C 8.8* 10.7*    Objective:   Vital Signs: There were no vitals taken for this visit.  Physical Exam  Gen: Well-appearing, in no acute distress; non-toxic CV: Well-perfused. Warm.  Resp: Breathing unlabored on room air; no wheezing. Psych: Fluid speech in conversation; appropriate affect; normal thought process Neuro: Sensation intact throughout. No gross coordination deficits.   Ortho Exam - Left shoulder: No bony TTP.  There is limited active and passive range of motion.  Forward flexion to approximately 165 degrees, external rotation of about 40 degrees, internal rotation with thumb to approximately T11 which is about the same as the contralateral arm.  External and internal rotation are improved from prior visit.  Imaging: No results found.  Past Medical/Family/Surgical/Social History: Medications & Allergies reviewed per EMR, new medications updated. Patient Active Problem List   Diagnosis Date Noted   Pressure injury of skin 10/05/2021   Achilles tendon contracture, right 11/30/2020   Closed fracture of proximal end of femur, right, initial encounter (HCC) 05/09/2019   Body mass index (BMI) 30.0-30.9, adult 04/26/2019   Hypertensive chronic kidney disease w stg 1-4/unsp chr kdny 04/26/2019   Obesity, unspecified 04/26/2019   CAD (coronary artery disease) 03/06/2019   Acute encephalopathy 12/22/2018   Presence of aortocoronary bypass graft 06/25/2018   Diabetic ketoacidosis (HCC) 04/30/2018   Gastroparesis 04/13/2018   DKA (diabetic ketoacidosis) (HCC) 03/31/2018   S/P CABG x 3 01/14/2018   CAD (coronary artery disease), native coronary artery 01/11/2018   Dehydration    MDD (major depressive disorder), single episode, severe , no psychosis (HCC)    DKA (diabetic ketoacidoses) 06/16/2017   Tardive dyskinesia    Type 1 diabetes mellitus with hyperlipidemia (HCC) 03/18/2016    Noncompliance with treatment plan 03/13/2016   Essential hypertension 01/24/2016   HLD (hyperlipidemia)    GERD (gastroesophageal reflux disease)    AKI (acute kidney injury) (HCC)    Anemia, iron deficiency    Vitamin B12 deficiency 08/16/2015   Diabetic gastroparesis (HCC)    Diabetic neuropathy, type I diabetes mellitus (HCC) 05/18/2015   Anxiety and depression 05/18/2015   Past Medical History:  Diagnosis Date   AKI (acute kidney injury) (HCC)    Anemia, iron deficiency    Anxiety and depression 05/18/2015   CAD (coronary artery disease), native coronary artery 01/11/2018   s/p CABG - Non-STEMI with severe three-vessel disease noted July 2019   Depression    Diabetic gastroparesis (HCC)    Diabetic neuropathy, type I diabetes mellitus (HCC) 05/18/2015   DKA, type 1 (HCC) 03/18/2016   Essential hypertension    Gastroparesis    GERD (gastroesophageal reflux disease)    HLD (hyperlipidemia)    MDD (major depressive disorder), single episode, severe , no psychosis (HCC)    Tardive dyskinesia    Vitamin B12 deficiency 08/16/2015   Family History  Problem Relation Age of Onset   Cystic fibrosis Mother    Hypertension Father    Diabetes Brother    Hypertension Maternal Grandmother    Past Surgical History:  Procedure Laterality Date   CARDIAC SURGERY     COLONOSCOPY  09/27/2014   at [facility]   CORONARY ARTERY BYPASS GRAFT N/A 01/14/2018   Procedure: CORONARY  ARTERY BYPASS GRAFTING (CABG)X3, RIGHT AND LEFT SAPHENOUS VEIN HARVEST, MAMMARY TAKE DOWN. MAMMARY TO LAD, SVG TO PD, SVG TO DISTAL CIRC.;  Surgeon: Delight Ovens, MD;  Location: [facility] OR;  Service: Open Heart Surgery;  Laterality: N/A;   ESOPHAGOGASTRODUODENOSCOPY  09/27/2014   at [facility], Dr Vashti Hey. biospy neg for celiac, neg for H pylori.    EYE SURGERY     gailstones     INTRAMEDULLARY (IM) NAIL INTERTROCHANTERIC Right 05/10/2019   Procedure: INTRAMEDULLARY (IM) NAIL INTERTROCHANTRIC;  Surgeon: Lyndle Herrlich,  MD;  Location: [facility] ORS;  Service: Orthopedics;  Laterality: Right;   IR FLUORO GUIDE CV LINE RIGHT  02/01/2017   IR FLUORO GUIDE CV LINE RIGHT  03/06/2017   IR FLUORO GUIDE CV LINE RIGHT  03/25/2017   IR GENERIC HISTORICAL  01/24/2016   IR FLUORO GUIDE CV LINE RIGHT 01/24/2016 Darrell K Allred, PA-C [facility]   IR GENERIC HISTORICAL  01/24/2016   IR US GUIDE VASC ACCESS RIGHT 01/24/2016 Darrell K Allred, PA-C [facility]   IR US GUIDE VASC ACCESS RIGHT  02/01/2017   IR US GUIDE VASC ACCESS RIGHT  03/06/2017   IR US GUIDE VASC ACCESS RIGHT  03/25/2017   IR US GUIDE VASC ACCESS RIGHT  01/20/2019   IR VENIPUNCTURE 61YRS/OLDER BY MD  01/20/2019   LEFT HEART CATH AND CORONARY ANGIOGRAPHY N/A 01/07/2018   Procedure: LEFT HEART CATH AND CORONARY ANGIOGRAPHY;  Surgeon: Lennette Bihari, MD;  Location: [facility] INVASIVE CV LAB;  Service: Cardiovascular;  Laterality: N/A;   POSTERIOR VITRECTOMY AND MEMBRANE PEEL-LEFT EYE  09/28/2002   POSTERIOR VITRECTOMY AND MEMBRANE PEEL-RIGHT EYE  03/16/2002   RETINAL DETACHMENT SURGERY     TEE WITHOUT CARDIOVERSION N/A 01/14/2018   Procedure: TRANSESOPHAGEAL ECHOCARDIOGRAM (TEE);  Surgeon: Delight Ovens, MD;  Location: [facility] OR;  Service: Open Heart Surgery;  Laterality: N/A;   Social History   Occupational History   Occupation: Unemployed  Tobacco Use   Smoking status: Never   Smokeless tobacco: Never  Vaping Use   Vaping Use: Never used  Substance and Sexual Activity   Alcohol use: No   Drug use: Yes    Types: Marijuana    Comment: occ   Sexual activity: Yes    Partners: Male    Birth control/protection: None

## 2023-01-01 NOTE — Progress Notes (Signed)
She is doing some better; states injection did help ROM is slowly improving She does HEP everyday and formal PT 1x/week

## 2023-01-10 ENCOUNTER — Encounter: Payer: Medicare HMO | Admitting: Rehabilitative and Restorative Service Providers"

## 2023-01-24 ENCOUNTER — Ambulatory Visit: Payer: Self-pay

## 2023-01-24 ENCOUNTER — Other Ambulatory Visit: Payer: Self-pay | Admitting: Podiatry

## 2023-01-24 ENCOUNTER — Encounter: Payer: Self-pay | Admitting: Podiatry

## 2023-01-24 ENCOUNTER — Ambulatory Visit (INDEPENDENT_AMBULATORY_CARE_PROVIDER_SITE_OTHER): Payer: Medicare HMO | Admitting: Podiatry

## 2023-01-24 DIAGNOSIS — L309 Dermatitis, unspecified: Secondary | ICD-10-CM | POA: Diagnosis not present

## 2023-01-24 DIAGNOSIS — M79675 Pain in left toe(s): Secondary | ICD-10-CM

## 2023-01-24 DIAGNOSIS — B351 Tinea unguium: Secondary | ICD-10-CM

## 2023-01-24 DIAGNOSIS — E1142 Type 2 diabetes mellitus with diabetic polyneuropathy: Secondary | ICD-10-CM | POA: Diagnosis not present

## 2023-01-24 DIAGNOSIS — M79674 Pain in right toe(s): Secondary | ICD-10-CM

## 2023-01-24 DIAGNOSIS — R6889 Other general symptoms and signs: Secondary | ICD-10-CM | POA: Diagnosis not present

## 2023-01-24 MED ORDER — AMMONIUM LACTATE 12 % EX CREA: 1.0000 | TOPICAL_CREAM | CUTANEOUS | 2 refills | Status: DC | PRN

## 2023-01-24 NOTE — Progress Notes (Signed)
This patient returns to my office for at risk foot care.  This patient requires this care by a professional since this patient will be at risk due to having diabetic neuropathy.   This patient is unable to cut nails herself since the patient cannot reach her nails.These nails are painful walking and wearing shoes.  She has dry scaly skin at site of previous ulcers  heels  B/L.This patient presents for at risk foot care today.  General Appearance  Alert, conversant and in no acute stress.  Vascular  Dorsalis pedis and posterior tibial  pulses are palpable  bilaterally.  Capillary return is within normal limits  bilaterally. Temperature is within normal limits  bilaterally.  Neurologic  Senn-Weinstein monofilament wire test within normal limits  bilaterally. Muscle power within normal limits bilaterally.  Nails Thick disfigured discolored nails with subungual debris  from hallux to fifth toes bilaterally. No evidence of bacterial infection or drainage bilaterally.  Orthopedic  No limitations of motion  feet .  No crepitus or effusions noted.  Hallux malleus  B/L.  Skin  normotropic skin with no porokeratosis noted bilaterally.  No signs of infections or ulcers noted.     Onychomycosis  Pain in right toes  Pain in left toes  Consent was obtained for treatment procedures.   Mechanical debridement of nails 1-5  bilaterally performed with a nail nipper.  Filed with dremel without incident. Prescribe lac-hydrin for skin on posterior heels  B/L.   Return office visit    3 months                  Told patient to return for periodic foot care and evaluation due to potential at risk complications.   Helane Gunther DPM

## 2023-01-24 NOTE — Progress Notes (Signed)
Patient wants cream for dry skin on heels since wound has healed.  Rx LacHydrin sent in

## 2023-01-24 NOTE — Telephone Encounter (Signed)
  Chief Complaint: Cloudy - off smelling urine Symptoms: above Frequency: Since May Pertinent Negatives: Patient denies  Disposition: [] ED /[] Urgent Care (no appt availability in office) / [] Appointment(In office/virtual)/ []  [place] [facility]/ [] Home Care/ [x] Refused Recommended Disposition /[] [place] [facility]/ []  Follow-up with PCP Additional Notes: Pt states that urinary issues have not resolved. Pt would like provider to send in prescription for abx. Pt states that this issue is ongoing since May. Pt was unable to pick up one medication previously prescribed d/t cost, (Metronidazole gel).  If needed pt will come in to office , but she needs notice in order to arrange transportation. Please advise.  Summary: Possible UTI   Patient was seen about a month ago and stated she explained to the nurse everything that was going on in regards to her UTI. Per patient she is still experiencing these symptoms such as cloudy urine and then an odor when first urinating after waking up. Patient was requesting medication to be sent in for this issue. Patients callback # [phone] (No appointments available at the time of phone call)     Reason for Disposition  Bad or foul-smelling urine  Answer Assessment - Initial Assessment Questions 1. SYMPTOM: "What's the main symptom you're concerned about?" (e.g., frequency, incontinence)     Cloudy, foul smelling odor 2. ONSET: "When did the  *No Answer*  start?"     Ongoing since may 3. PAIN: "Is there any pain?" If Yes, ask: "How bad is it?" (Scale: 1-10; mild, moderate, severe)     no  5. OTHER SYMPTOMS: "Do you have any other symptoms?" (e.g., blood in urine, fever, flank pain, pain with urination)     no  Protocols used: Urinary Symptoms-A-AH

## 2023-01-25 MED ORDER — CIPROFLOXACIN HCL 500 MG PO TABS: 500.0000 mg | ORAL_TABLET | Freq: Two times a day (BID) | ORAL | 0 refills | Status: AC

## 2023-01-25 NOTE — Telephone Encounter (Signed)
Unable to reach, mailbox is full.   Notifiy patient Rx: ATB was sent to pharmacy.

## 2023-01-25 NOTE — Telephone Encounter (Signed)
I am out of the office today but I have sent in a Prescription for antibiotics  to her pharmacy.

## 2023-01-25 NOTE — Addendum Note (Signed)
Addended by: Hoy Register on: 01/25/2023 07:12 AM   Modules accepted: Orders

## 2023-01-28 NOTE — Telephone Encounter (Signed)
Call placed to patient unable to reach unable to leave message mailbox is full.

## 2023-02-01 ENCOUNTER — Ambulatory Visit: Payer: Medicare HMO | Admitting: Physician Assistant

## 2023-02-14 ENCOUNTER — Encounter: Payer: Self-pay | Admitting: "Endocrinology

## 2023-02-14 ENCOUNTER — Ambulatory Visit: Payer: Medicare HMO | Admitting: "Endocrinology

## 2023-02-14 ENCOUNTER — Telehealth: Payer: Self-pay | Admitting: "Endocrinology

## 2023-02-14 VITALS — BP 140/65 | HR 74 | Ht 61.0 in | Wt 183.6 lb

## 2023-02-14 DIAGNOSIS — R6889 Other general symptoms and signs: Secondary | ICD-10-CM | POA: Diagnosis not present

## 2023-02-14 DIAGNOSIS — E7800 Pure hypercholesterolemia, unspecified: Secondary | ICD-10-CM | POA: Diagnosis not present

## 2023-02-14 DIAGNOSIS — E1065 Type 1 diabetes mellitus with hyperglycemia: Secondary | ICD-10-CM | POA: Diagnosis not present

## 2023-02-14 LAB — POCT GLYCOSYLATED HEMOGLOBIN (HGB A1C): Hemoglobin A1C: 10.9 % — AB (ref 4.0–5.6)

## 2023-02-14 MED ORDER — OMNIPOD 5 DEXG7G6 PODS GEN 5 MISC
1.0000 | 1 refills | Status: DC
Start: 2023-02-14 — End: 2023-05-24

## 2023-02-14 MED ORDER — INSULIN GLARGINE 100 UNIT/ML ~~LOC~~ SOLN
SUBCUTANEOUS | 1 refills | Status: DC
Start: 2023-02-14 — End: 2023-05-20

## 2023-02-14 MED ORDER — INSULIN ASPART 100 UNIT/ML IJ SOLN: INTRAMUSCULAR | 5 refills | Status: DC

## 2023-02-14 MED ORDER — PREGABALIN 150 MG PO CAPS
150.0000 mg | ORAL_CAPSULE | Freq: Two times a day (BID) | ORAL | 3 refills | Status: DC
Start: 2023-02-14 — End: 2023-02-15

## 2023-02-14 NOTE — Patient Instructions (Signed)

## 2023-02-14 NOTE — Progress Notes (Signed)
Outpatient Endocrinology Note Yvette Darien, MD  02/14/23   [name] 04/15/1966 [id number]  Referring Provider: Hoy Register, MD Primary Care Provider: Hoy Register, MD Reason for consultation: Subjective   Assessment & Plan  Diagnoses and all orders for this visit:  Uncontrolled type 1 diabetes mellitus with hyperglycemia (HCC) -     POCT glycosylated hemoglobin (Hb A1C) -     Insulin Disposable Pump (OMNIPOD 5 G6 PODS, GEN 5,) MISC; 1 Device by Does not apply route continuous. -     pregabalin (LYRICA) 150 MG capsule; Take 1 capsule (150 mg total) by mouth 2 (two) times daily. -     insulin glargine (LANTUS) 100 UNIT/ML injection; Lantus 9 units bid  Pure hypercholesterolemia  Other orders -     insulin aspart (NOVOLOG) 100 UNIT/ML injection; 4-8 units three times a day 15 min before meals    Diabetes Type I complicated by +  MI, +  retinopathy (+retinal detachment), +  neuropathy, +  nephropathy Lab Results  Component Value Date   GFR 51.61 (L) 07/04/2022   Hba1c goal less than 7, current Hba1c is  Lab Results  Component Value Date   HGBA1C 10.9 (A) 02/14/2023   Will recommend the following: Lantus 9 units twice a day Novolog 4-8 units 15 min before meals  Due eye exam  Patient wants to resume omnipod 5 (switched between omnipod and insulin shots based on affordability)   No known contraindications/side effects to any of above medications  -Last LD and Tg are as follows: Lab Results  Component Value Date   LDLCALC 105 (H) 08/09/2021    Lab Results  Component Value Date   TRIG 104.0 08/09/2021   -On rosuvastatin 20 mg QD -Follow low fat diet and exercise   -Blood pressure goal <140/90 - Microalbumin/creatinine goal is < 30 -Last MA/Cr is as follows: Lab Results  Component Value Date   MICROALBUR 7.5 (H) 01/02/2022   -on ACE/ARB losartan 20 mg qd -diet changes including salt restriction -limit eating outside -counseled BP  targets per standards of diabetes care -uncontrolled blood pressure can lead to retinopathy, nephropathy and cardiovascular and atherosclerotic heart disease  Reviewed and counseled on: -A1C target -Blood sugar targets -Complications of uncontrolled diabetes  -Checking blood sugar before meals and bedtime and bring log next visit -All medications with mechanism of action and side effects -Hypoglycemia management: rule of 15's, Glucagon Emergency Kit and medical alert ID -low-carb low-fat plate-method diet -At least 20 minutes of physical activity per day -Annual dilated retinal eye exam and foot exam -compliance and follow up needs -follow up as scheduled or earlier if problem gets worse  Call if blood sugar is less than 70 or consistently above 250    Take a 15 gm snack of carbohydrate at bedtime before you go to sleep if your blood sugar is less than 100.    If you are going to fast after midnight for a test or procedure, ask your physician for instructions on how to reduce/decrease your insulin dose.    Call if blood sugar is less than 70 or consistently above 250  -Treating a low sugar by rule of 15  (15 gms of sugar every 15 min until sugar is more than 70) If you feel your sugar is low, test your sugar to be sure If your sugar is low (less than 70), then take 15 grams of a fast acting Carbohydrate (3-4 glucose tablets or glucose gel or 4 ounces  of juice or regular soda) Recheck your sugar 15 min after treating low to make sure it is more than 70 If sugar is still less than 70, treat again with 15 grams of carbohydrate          Don't drive the hour of hypoglycemia  If unconscious/unable to eat or drink by mouth, use glucagon injection or nasal spray baqsimi and call 911. Can repeat again in 15 min if still unconscious.  Return in about 3 months (around 05/17/2023).   I have reviewed current medications, nurse's notes, allergies, vital signs, past medical and surgical history,  family medical history, and social history for this encounter. Counseled patient on symptoms, examination findings, lab findings, imaging results, treatment decisions and monitoring and prognosis. The patient understood the recommendations and agrees with the treatment plan. All questions regarding treatment plan were fully answered.  Yvette [place], MD  02/14/23    History of Present Illness [name] is a 57 y.o. year old female who presents for evaluation of Type 1 diabetes mellitus.  [name] was first diagnosed in 57.   Diabetes education +  Home diabetes regimen: Lantus 9 units twice a day Novolog 1-4 units may take before/after meals   Previous history:  She has mostly been followed by primary care physicians for her diabetes with consistently poor control She has had periodic episodes of ketoacidosis Previous insulin regimen:   OmniPod insulin pump since 07/07/2019, currently not using  Basal rates:  MN: 0.75, 10AM; 1.0, 10:30AM: 1.2, 4PM: 0.75, 7PM: 0.80,  I/C: 1, ISF: 30, timing 4 hours, target: MN: 120 with correction over 140  COMPLICATIONS +  MI, - Stroke +  retinopathy (+retinal detachment) +  neuropathy +  nephropathy  BLOOD SUGAR DATA  CGM interpretation: At today's visit, we reviewed her CGM downloads. The full report is scanned in the media. Reviewing the CGM trends, BG are elevated in the day. And low overnight.   Physical Exam  BP (!) 140/65   Pulse 74   Ht 5\' 1"  (1.549 m)   Wt 183 lb 9.6 oz (83.3 kg)   SpO2 99%   BMI 34.69 kg/m    Constitutional: well developed, well nourished Head: normocephalic, atraumatic Eyes: sclera anicteric, no redness Neck: supple Lungs: normal respiratory effort Neurology: alert and oriented Skin: dry, no appreciable rashes Musculoskeletal: no appreciable defects Psychiatric: normal mood and affect Diabetic Foot Exam - Simple   No data filed      Current Medications Patient's Medications  New  Prescriptions   INSULIN DISPOSABLE PUMP (OMNIPOD 5 G6 PODS, GEN 5,) MISC    1 Device by Does not apply route continuous.  Previous Medications   ACETAMINOPHEN (TYLENOL) 500 MG TABLET    Take 1,000 mg by mouth at bedtime as needed for mild pain.   AMMONIUM LACTATE (AMLACTIN) 12 % CREAM    Apply 1 Application topically as needed for dry skin.   ASPIRIN EC 81 MG TABLET    Take 1 tablet (81 mg total) by mouth daily.   FUROSEMIDE (LASIX) 40 MG TABLET    Take 1 tablet (40 mg total) by mouth daily.   INSULIN ASPART (NOVOLOG) 100 UNIT/ML INJECTION    100 units in pump daily   INSULIN SYRINGE-NEEDLE U-100 (BD INSULIN SYRINGE U/F) 31G X 5/16" 0.5 ML MISC    USE FOUR TIMES DAILY FOR INSULIN   INSULIN SYRINGES, DISPOSABLE, U-100 0.3 ML MISC    Use to draw up insulin as instructed  LOSARTAN (COZAAR) 25 MG TABLET    Take 1 tablet (25 mg total) by mouth daily. Hold this medication if your systolic blood pressure ( top number) s less than 100. Please call your doctor is your systolic blood pressure is less than 100   METOCLOPRAMIDE (REGLAN) 10 MG TABLET    TAKE 1 TABLET(10 MG) BY MOUTH THREE TIMES DAILY BEFORE MEALS   METOPROLOL TARTRATE (LOPRESSOR) 25 MG TABLET    Take 0.5 tablets (12.5 mg total) by mouth 2 (two) times daily. Please check your blood pressure twice a day, please hold this medication if your systolic blood pressure ( top number) is less than 100, please call your doctor if your systolic blood pressure is less than 100.   ONDANSETRON (ZOFRAN) 4 MG TABLET    Take 1 tablet (4 mg total) by mouth every 8 (eight) hours as needed for nausea or vomiting.   ROSUVASTATIN (CRESTOR) 20 MG TABLET    Take 1 tablet (20 mg total) by mouth daily.   SILVER SULFADIAZINE (SILVADENE) 1 % CREAM    Apply 1 Application topically daily.   TRAMADOL (ULTRAM) 50 MG TABLET    Take 1 tablet (50 mg total) by mouth 2 (two) times daily as needed.  Modified Medications   Modified Medication Previous Medication   INSULIN ASPART  (NOVOLOG) 100 UNIT/ML INJECTION insulin aspart (NOVOLOG) 100 UNIT/ML injection      4-8 units three times a day 15 min before meals    Inject 7 Units into the skin 3 (three) times daily before meals.   INSULIN GLARGINE (LANTUS) 100 UNIT/ML INJECTION insulin glargine (LANTUS) 100 UNIT/ML injection      Lantus 9 units bid    Lantus 9 units in pm   PREGABALIN (LYRICA) 150 MG CAPSULE pregabalin (LYRICA) 150 MG capsule      Take 1 capsule (150 mg total) by mouth 2 (two) times daily.    Take 1 capsule (150 mg total) by mouth 2 (two) times daily.  Discontinued Medications   INSULIN DISPOSABLE PUMP (OMNIPOD DASH PODS, GEN 4,) MISC    SUB-Q Every Other Day    Allergies Allergies  Allergen Reactions   Anesthetics, Amide Nausea And Vomiting   Chlorhexidine Other (See Comments)    Unknown reaction   Penicillins Diarrhea, Nausea And Vomiting and Other (See Comments)   Enkaid [Encainide] Nausea And Vomiting   Subutex [Buprenorphine] Rash    Past Medical History Past Medical History:  Diagnosis Date   AKI (acute kidney injury) (HCC)    Anemia, iron deficiency    Anxiety and depression 05/18/2015   CAD (coronary artery disease), native coronary artery 01/11/2018   s/p CABG - Non-STEMI with severe three-vessel disease noted July 2019   Depression    Diabetic gastroparesis (HCC)    Diabetic neuropathy, type I diabetes mellitus (HCC) 05/18/2015   DKA, type 1 (HCC) 03/18/2016   Essential hypertension    Gastroparesis    GERD (gastroesophageal reflux disease)    HLD (hyperlipidemia)    MDD (major depressive disorder), single episode, severe , no psychosis (HCC)    Tardive dyskinesia    Vitamin B12 deficiency 08/16/2015    Past Surgical History Past Surgical History:  Procedure Laterality Date   CARDIAC SURGERY     COLONOSCOPY  09/27/2014   at [facility]   CORONARY ARTERY BYPASS GRAFT N/A 01/14/2018   Procedure: CORONARY ARTERY BYPASS GRAFTING (CABG)X3, RIGHT AND LEFT SAPHENOUS VEIN HARVEST, MAMMARY  TAKE DOWN. MAMMARY TO LAD, SVG TO PD,  SVG TO DISTAL CIRC.;  Surgeon: Delight Ovens, MD;  Location: [facility] OR;  Service: Open Heart Surgery;  Laterality: N/A;   ESOPHAGOGASTRODUODENOSCOPY  09/27/2014   at [facility], Dr Vashti Hey. biospy neg for celiac, neg for H pylori.    EYE SURGERY     gailstones     INTRAMEDULLARY (IM) NAIL INTERTROCHANTERIC Right 05/10/2019   Procedure: INTRAMEDULLARY (IM) NAIL INTERTROCHANTRIC;  Surgeon: Lyndle Herrlich, MD;  Location: [facility] ORS;  Service: Orthopedics;  Laterality: Right;   IR FLUORO GUIDE CV LINE RIGHT  02/01/2017   IR FLUORO GUIDE CV LINE RIGHT  03/06/2017   IR FLUORO GUIDE CV LINE RIGHT  03/25/2017   IR GENERIC HISTORICAL  01/24/2016   IR FLUORO GUIDE CV LINE RIGHT 01/24/2016 Darrell K Allred, PA-C [facility]   IR GENERIC HISTORICAL  01/24/2016   IR US GUIDE VASC ACCESS RIGHT 01/24/2016 Darrell K Allred, PA-C [facility]   IR US GUIDE VASC ACCESS RIGHT  02/01/2017   IR US GUIDE VASC ACCESS RIGHT  03/06/2017   IR US GUIDE VASC ACCESS RIGHT  03/25/2017   IR US GUIDE VASC ACCESS RIGHT  01/20/2019   IR VENIPUNCTURE 41YRS/OLDER BY MD  01/20/2019   LEFT HEART CATH AND CORONARY ANGIOGRAPHY N/A 01/07/2018   Procedure: LEFT HEART CATH AND CORONARY ANGIOGRAPHY;  Surgeon: Lennette Bihari, MD;  Location: [facility] INVASIVE CV LAB;  Service: Cardiovascular;  Laterality: N/A;   POSTERIOR VITRECTOMY AND MEMBRANE PEEL-LEFT EYE  09/28/2002   POSTERIOR VITRECTOMY AND MEMBRANE PEEL-RIGHT EYE  03/16/2002   RETINAL DETACHMENT SURGERY     TEE WITHOUT CARDIOVERSION N/A 01/14/2018   Procedure: TRANSESOPHAGEAL ECHOCARDIOGRAM (TEE);  Surgeon: Delight Ovens, MD;  Location: [facility] OR;  Service: Open Heart Surgery;  Laterality: N/A;    Family History family history includes Cystic fibrosis in her mother; Diabetes in her brother; Hypertension in her father and maternal grandmother.  Social History Social History   Socioeconomic History   Marital status: Single    Spouse name: Not on file    Number of children: 1   Years of education: Not on file   Highest education level: Not on file  Occupational History   Occupation: Unemployed  Tobacco Use   Smoking status: Never   Smokeless tobacco: Never  Vaping Use   Vaping status: Never Used  Substance and Sexual Activity   Alcohol use: No   Drug use: Yes    Types: Marijuana    Comment: occ   Sexual activity: Yes    Partners: Male    Birth control/protection: None  Other Topics Concern   Not on file  Social History Narrative   Not on file   Social Determinants of Health   Financial Resource Strain: Low Risk  (12/26/2022)   Overall Financial Resource Strain (CARDIA)    Difficulty of Paying Living Expenses: Not hard at all  Food Insecurity: No Food Insecurity (12/26/2022)   Hunger Vital Sign    Worried About Running Out of Food in the Last Year: Never true    Ran Out of Food in the Last Year: Never true  Transportation Needs: No Transportation Needs (12/26/2022)   PRAPARE - Administrator, Civil Service (Medical): No    Lack of Transportation (Non-Medical): No  Physical Activity: Inactive (12/26/2022)   Exercise Vital Sign    Days of Exercise per Week: 0 days    Minutes of Exercise per Session: 0 min  Stress: No Stress Concern Present (12/26/2022)  [employer] of Occupational Health - Occupational Stress Questionnaire    Feeling of Stress : Only a little  Social Connections: Moderately Isolated (12/26/2022)   Social Connection and Isolation Panel [NHANES]    Frequency of Communication with Friends and Family: More than three times a week    Frequency of Social Gatherings with Friends and Family: Three times a week    Attends Religious Services: 1 to 4 times per year    Active Member of Clubs or Organizations: No    Attends Banker Meetings: Never    Marital Status: Never married  Intimate Partner Violence: Not At Risk (12/26/2022)   Humiliation, Afraid, Rape, and Kick questionnaire    Fear of  Current or Ex-Partner: No    Emotionally Abused: No    Physically Abused: No    Sexually Abused: No    Lab Results  Component Value Date   HGBA1C 10.9 (A) 02/14/2023   HGBA1C 10.7 (A) 07/04/2022   HGBA1C 8.8 (A) 01/02/2022   Lab Results  Component Value Date   CHOL 183 08/09/2021   Lab Results  Component Value Date   HDL 57.50 08/09/2021   Lab Results  Component Value Date   LDLCALC 105 (H) 08/09/2021   Lab Results  Component Value Date   TRIG 104.0 08/09/2021   Lab Results  Component Value Date   CHOLHDL 3 08/09/2021   Lab Results  Component Value Date   CREATININE 1.19 07/04/2022   Lab Results  Component Value Date   GFR 51.61 (L) 07/04/2022   Lab Results  Component Value Date   MICROALBUR 7.5 (H) 01/02/2022      Component Value Date/Time   NA 136 07/04/2022 1129   NA 136 10/10/2018 1242   K 4.6 07/04/2022 1129   CL 102 07/04/2022 1129   CO2 24 07/04/2022 1129   GLUCOSE 252 (H) 07/04/2022 1129   BUN 21 07/04/2022 1129   BUN 12 10/10/2018 1242   CREATININE 1.19 07/04/2022 1129   CREATININE 1.11 (H) 09/04/2016 1459   CALCIUM 9.5 07/04/2022 1129   PROT 6.2 (L) 01/07/2022 1450   ALBUMIN 2.9 (L) 01/07/2022 1450   AST 22 01/07/2022 1450   ALT 15 01/07/2022 1450   ALKPHOS 91 01/07/2022 1450   BILITOT 0.5 01/07/2022 1450   GFRNONAA 51 (L) 01/10/2022 0103   GFRNONAA 58 (L) 09/04/2016 1459   GFRAA 54 (L) 05/15/2019 0344   GFRAA 67 09/04/2016 1459      Latest Ref Rng & Units 07/04/2022   11:29 AM 01/10/2022    1:03 AM 01/09/2022    7:13 AM  BMP  Glucose 70 - 99 mg/dL 161  096  045   BUN 6 - 23 mg/dL 21  18  15    Creatinine 0.40 - 1.20 mg/dL 4.09  8.11  9.14   Sodium 135 - 145 mEq/L 136  140  142   Potassium 3.5 - 5.1 mEq/L 4.6  4.0  4.1   Chloride 96 - 112 mEq/L 102  106  106   CO2 19 - 32 mEq/L 24  25  24    Calcium 8.4 - 10.5 mg/dL 9.5  8.9  8.9        Component Value Date/Time   WBC 5.9 01/10/2022 0103   RBC 4.09 01/10/2022 0103   HGB 10.5  (L) 01/10/2022 0103   HGB 11.4 11/05/2018 1419   HCT 33.5 (L) 01/10/2022 0103   HCT 33.5 (L) 11/05/2018 1419   PLT 236 01/10/2022  0103   PLT 292 11/05/2018 1419   MCV 81.9 01/10/2022 0103   MCV 89 11/05/2018 1419   MCH 25.7 (L) 01/10/2022 0103   MCHC 31.3 01/10/2022 0103   RDW 14.4 01/10/2022 0103   RDW 12.7 11/05/2018 1419   LYMPHSABS 1.5 01/08/2022 1218   MONOABS 0.9 01/08/2022 1218   EOSABS 0.1 01/08/2022 1218   BASOSABS 0.1 01/08/2022 1218     Parts of this note may have been dictated using voice recognition software. There may be variances in spelling and vocabulary which are unintentional. Not all errors are proofread. Please notify the Thereasa Parkin if any discrepancies are noted or if the meaning of any statement is not clear.

## 2023-02-15 ENCOUNTER — Telehealth: Payer: Self-pay | Admitting: "Endocrinology

## 2023-02-15 ENCOUNTER — Other Ambulatory Visit: Payer: Self-pay

## 2023-02-15 DIAGNOSIS — E1065 Type 1 diabetes mellitus with hyperglycemia: Secondary | ICD-10-CM

## 2023-02-15 MED ORDER — INSULIN ASPART 100 UNIT/ML IJ SOLN
INTRAMUSCULAR | 3 refills | Status: DC
Start: 2023-02-15 — End: 2023-05-20

## 2023-02-15 MED ORDER — PREGABALIN 150 MG PO CAPS
150.0000 mg | ORAL_CAPSULE | Freq: Two times a day (BID) | ORAL | 3 refills | Status: DC
Start: 2023-02-15 — End: 2023-05-26

## 2023-03-05 ENCOUNTER — Encounter: Payer: Self-pay | Admitting: Orthopaedic Surgery

## 2023-03-05 ENCOUNTER — Ambulatory Visit: Payer: Medicare HMO | Admitting: Physician Assistant

## 2023-03-05 ENCOUNTER — Ambulatory Visit: Payer: Medicare HMO | Admitting: Orthopaedic Surgery

## 2023-03-05 DIAGNOSIS — R6889 Other general symptoms and signs: Secondary | ICD-10-CM | POA: Diagnosis not present

## 2023-03-05 DIAGNOSIS — M7502 Adhesive capsulitis of left shoulder: Secondary | ICD-10-CM

## 2023-03-05 DIAGNOSIS — E785 Hyperlipidemia, unspecified: Secondary | ICD-10-CM

## 2023-03-05 DIAGNOSIS — E1069 Type 1 diabetes mellitus with other specified complication: Secondary | ICD-10-CM

## 2023-03-05 NOTE — Progress Notes (Signed)
Office Visit Note   Patient: [name]           Date of Birth: February 25, 1966           MRN: [id number] Visit Date: 03/05/2023              Requested by: Hoy Register, MD [address] [place],  [place] [address] PCP: Hoy Register, MD   Assessment & Plan: Visit Diagnoses:  1. Adhesive capsulitis of left shoulder   2. Type 1 diabetes mellitus with hyperlipidemia [facility])     Plan: [name] is a 57 year old female with left shoulder adhesive capsulitis.  She has had an injection about 2 months ago which helped for 2 months  Unfortunately her diabetes is still uncontrolled and she is unable to afford physical therapy.  I do not think repeating the injection would be helpful in the long run as it would only make it more difficult to control her diabetes.  I do feel that once diabetes is under better control the adhesive capsulitis should improve along with it.  If the problem persists despite her diabetes improving we can certainly try another cortisone injection.  We also talked about shoulder manipulation and lysis of adhesions which she is not interested in currently.  Total face to face encounter time was greater than 25 minutes and over half of this time was spent in counseling and/or coordination of care.  Follow-Up Instructions: No follow-ups on file.   Orders:  No orders of the defined types were placed in this encounter.  No orders of the defined types were placed in this encounter.     Procedures: No procedures performed   Clinical Data: No additional findings.   Subjective: Chief Complaint  Patient presents with   Left Shoulder - Follow-up    HPI [name] returns today for follow-up on left shoulder adhesive capsulitis.  Underwent a cortisone injection 2 months ago which helped for about 2 months.  She is unable to continue physical therapy due to the cost.  She is also running into trouble with her diabetic medications because of  cost.  Review of Systems  Constitutional: Negative.   HENT: Negative.    Eyes: Negative.   Respiratory: Negative.    Cardiovascular: Negative.   Endocrine: Negative.   Musculoskeletal: Negative.   Neurological: Negative.   Hematological: Negative.   Psychiatric/Behavioral: Negative.    All other systems reviewed and are negative.    Objective: Vital Signs: There were no vitals taken for this visit.  Physical Exam Vitals and nursing note reviewed.  Constitutional:      Appearance: She is well-developed.  HENT:     Head: Atraumatic.     Nose: Nose normal.  Eyes:     Extraocular Movements: Extraocular movements intact.  Cardiovascular:     Pulses: Normal pulses.  Pulmonary:     Effort: Pulmonary effort is normal.  Abdominal:     Palpations: Abdomen is soft.  Musculoskeletal:     Cervical back: Neck supple.  Skin:    General: Skin is warm.     Capillary Refill: Capillary refill takes less than 2 seconds.  Neurological:     Mental Status: She is alert. Mental status is at baseline.  Psychiatric:        Behavior: Behavior normal.        Thought Content: Thought content normal.        Judgment: Judgment normal.     Ortho Exam Examination left  shoulder is unchanged. Specialty Comments:  No specialty comments available.  Imaging: No results found.   PMFS History: Patient Active Problem List   Diagnosis Date Noted   Dermatitis 01/24/2023   Pressure injury of skin 10/05/2021   Achilles tendon contracture, right 11/30/2020   Closed fracture of proximal end of femur, right, initial encounter (HCC) 05/09/2019   Body mass index (BMI) 30.0-30.9, adult 04/26/2019   Hypertensive chronic kidney disease w stg 1-4/unsp chr kdny 04/26/2019   Obesity, unspecified 04/26/2019   CAD (coronary artery disease) 03/06/2019   Acute encephalopathy 12/22/2018   Presence of aortocoronary bypass graft 06/25/2018   Diabetic ketoacidosis (HCC) 04/30/2018   Gastroparesis 04/13/2018    DKA (diabetic ketoacidosis) (HCC) 03/31/2018   S/P CABG x 3 01/14/2018   CAD (coronary artery disease), native coronary artery 01/11/2018   Dehydration    MDD (major depressive disorder), single episode, severe , no psychosis (HCC)    DKA (diabetic ketoacidoses) 06/16/2017   Tardive dyskinesia    Type 1 diabetes mellitus with hyperlipidemia (HCC) 03/18/2016   Noncompliance with treatment plan 03/13/2016   Essential hypertension 01/24/2016   HLD (hyperlipidemia)    GERD (gastroesophageal reflux disease)    AKI (acute kidney injury) (HCC)    Anemia, iron deficiency    Vitamin B12 deficiency 08/16/2015   Diabetic gastroparesis (HCC)    Diabetic neuropathy, type I diabetes mellitus (HCC) 05/18/2015   Anxiety and depression 05/18/2015   Past Medical History:  Diagnosis Date   AKI (acute kidney injury) (HCC)    Anemia, iron deficiency    Anxiety and depression 05/18/2015   CAD (coronary artery disease), native coronary artery 01/11/2018   s/p CABG - Non-STEMI with severe three-vessel disease noted July 2019   Depression    Diabetic gastroparesis (HCC)    Diabetic neuropathy, type I diabetes mellitus (HCC) 05/18/2015   DKA, type 1 (HCC) 03/18/2016   Essential hypertension    Gastroparesis    GERD (gastroesophageal reflux disease)    HLD (hyperlipidemia)    MDD (major depressive disorder), single episode, severe , no psychosis (HCC)    Tardive dyskinesia    Vitamin B12 deficiency 08/16/2015    Family History  Problem Relation Age of Onset   Cystic fibrosis Mother    Hypertension Father    Diabetes Brother    Hypertension Maternal Grandmother     Past Surgical History:  Procedure Laterality Date   CARDIAC SURGERY     COLONOSCOPY  09/27/2014   at [facility]   CORONARY ARTERY BYPASS GRAFT N/A 01/14/2018   Procedure: CORONARY ARTERY BYPASS GRAFTING (CABG)X3, RIGHT AND LEFT SAPHENOUS VEIN HARVEST, MAMMARY TAKE DOWN. MAMMARY TO LAD, SVG TO PD, SVG TO DISTAL CIRC.;  Surgeon: Delight Ovens, MD;  Location: [facility] OR;  Service: Open Heart Surgery;  Laterality: N/A;   ESOPHAGOGASTRODUODENOSCOPY  09/27/2014   at [facility], Dr Vashti Hey. biospy neg for celiac, neg for H pylori.    EYE SURGERY     gailstones     INTRAMEDULLARY (IM) NAIL INTERTROCHANTERIC Right 05/10/2019   Procedure: INTRAMEDULLARY (IM) NAIL INTERTROCHANTRIC;  Surgeon: Lyndle Herrlich, MD;  Location: [facility] ORS;  Service: Orthopedics;  Laterality: Right;   IR FLUORO GUIDE CV LINE RIGHT  02/01/2017   IR FLUORO GUIDE CV LINE RIGHT  03/06/2017   IR FLUORO GUIDE CV LINE RIGHT  03/25/2017   IR GENERIC HISTORICAL  01/24/2016   IR FLUORO GUIDE CV LINE RIGHT 01/24/2016 Darrell K Allred, PA-C [facility]   IR  GENERIC HISTORICAL  01/24/2016   IR US GUIDE VASC ACCESS RIGHT 01/24/2016 Darrell K Allred, PA-C [facility]   IR US GUIDE VASC ACCESS RIGHT  02/01/2017   IR US GUIDE VASC ACCESS RIGHT  03/06/2017   IR US GUIDE VASC ACCESS RIGHT  03/25/2017   IR US GUIDE VASC ACCESS RIGHT  01/20/2019   IR VENIPUNCTURE 58YRS/OLDER BY MD  01/20/2019   LEFT HEART CATH AND CORONARY ANGIOGRAPHY N/A 01/07/2018   Procedure: LEFT HEART CATH AND CORONARY ANGIOGRAPHY;  Surgeon: Lennette Bihari, MD;  [facility]: [facility];  Service: Cardiovascular;  Laterality: N/A;   POSTERIOR VITRECTOMY AND MEMBRANE PEEL-LEFT EYE  09/28/2002   POSTERIOR VITRECTOMY AND MEMBRANE PEEL-RIGHT EYE  03/16/2002   RETINAL DETACHMENT SURGERY     TEE WITHOUT CARDIOVERSION N/A 01/14/2018   Procedure: TRANSESOPHAGEAL ECHOCARDIOGRAM (TEE);  Surgeon: Delight Ovens, MD;  Location: [facility] OR;  Service: Open Heart Surgery;  Laterality: N/A;   Social History   Occupational History   Occupation: Unemployed  Tobacco Use   Smoking status: Never   Smokeless tobacco: Never  Vaping Use   Vaping status: Never Used  Substance and Sexual Activity   Alcohol use: No   Drug use: Yes    Types: Marijuana    Comment: occ   Sexual activity: Yes    Partners: Male    Birth  control/protection: None

## 2023-03-06 ENCOUNTER — Telehealth: Payer: Self-pay | Admitting: Nutrition

## 2023-03-06 NOTE — Telephone Encounter (Signed)
Patient is wanting training on her OmniPod 5.  She has no sensors.  Please order her the Dexcom G6 from [facility], so that I can start her on the pump.

## 2023-03-07 NOTE — Telephone Encounter (Signed)
[name], does she need just the sensors or the whole kit?

## 2023-03-11 ENCOUNTER — Other Ambulatory Visit: Payer: Self-pay | Admitting: Cardiovascular Disease

## 2023-03-11 DIAGNOSIS — I10 Essential (primary) hypertension: Secondary | ICD-10-CM

## 2023-04-03 ENCOUNTER — Telehealth: Payer: Self-pay | Admitting: Family Medicine

## 2023-04-03 ENCOUNTER — Other Ambulatory Visit: Payer: Self-pay | Admitting: Cardiovascular Disease

## 2023-04-03 NOTE — Telephone Encounter (Addendum)
Pt would like a call back to set up an appointment w/ her or phone call.  Pt declined to provide any other information concerning the matter.  The number is [phone].

## 2023-04-04 NOTE — Telephone Encounter (Signed)
Call returned to patient. She explained that she would like to complete an [employer] [place] application because she has used up all of her rides through her insurance company.  I told her that I can mail her an application or complete it with her over the phone. She was agreeable to completing over the phone. I told her I was not able to do it right now, but can call her Monday and we will complete it. She was very Adult nurse.  I told her that we can provide a cab ride for her to/from her appointment at [facility] in November.

## 2023-04-08 NOTE — Telephone Encounter (Signed)
I called patient and we completed the [employer] [place] application

## 2023-04-08 NOTE — Telephone Encounter (Signed)
Completed application emailed to [employer] [place]

## 2023-04-16 DIAGNOSIS — G8929 Other chronic pain: Secondary | ICD-10-CM | POA: Diagnosis not present

## 2023-04-16 DIAGNOSIS — M5442 Lumbago with sciatica, left side: Secondary | ICD-10-CM | POA: Diagnosis not present

## 2023-04-16 DIAGNOSIS — M5441 Lumbago with sciatica, right side: Secondary | ICD-10-CM | POA: Diagnosis not present

## 2023-04-19 ENCOUNTER — Telehealth: Payer: Self-pay | Admitting: Family Medicine

## 2023-04-19 NOTE — Telephone Encounter (Signed)
Copied from CRM [id number]. Topic: General - Inquiry >> Apr 19, 2023 10:42 AM Patsy Lager T wrote: Reason for CRM: patient is requesting Robyne Peers call her back in reference to [place]. Left teams msg also.

## 2023-04-19 NOTE — Telephone Encounter (Signed)
I returned the call to the patient and told her that I have not heard anything from [employer] about the application but I will send them a message to check on the status . I explained that it can take a couple of weeks to receive a response after the application is submitted and it has only been 10 days.  She said she thought it was longer. She then asked about applying for Medicaid.  I explained to her that per Epic, it shows she has [employer].  She stated that she does not think she has that.  I told her that I can ask our Medicaid Eligibility case worker to call her and she was in agreement.

## 2023-04-19 NOTE — Telephone Encounter (Signed)
Message sent to [facility] [employer] checking on the status of the application. Message received from Eulis Canner, [facility] Eligibility stating she will call the patient about her Medicaid status.

## 2023-04-21 DIAGNOSIS — Z79899 Other long term (current) drug therapy: Secondary | ICD-10-CM | POA: Diagnosis not present

## 2023-04-21 DIAGNOSIS — M5441 Lumbago with sciatica, right side: Secondary | ICD-10-CM | POA: Diagnosis not present

## 2023-04-21 DIAGNOSIS — Z6834 Body mass index (BMI) 34.0-34.9, adult: Secondary | ICD-10-CM | POA: Diagnosis not present

## 2023-04-21 DIAGNOSIS — E559 Vitamin D deficiency, unspecified: Secondary | ICD-10-CM | POA: Diagnosis not present

## 2023-04-21 DIAGNOSIS — M47816 Spondylosis without myelopathy or radiculopathy, lumbar region: Secondary | ICD-10-CM | POA: Diagnosis not present

## 2023-04-21 DIAGNOSIS — Z1159 Encounter for screening for other viral diseases: Secondary | ICD-10-CM | POA: Diagnosis not present

## 2023-04-21 DIAGNOSIS — M5442 Lumbago with sciatica, left side: Secondary | ICD-10-CM | POA: Diagnosis not present

## 2023-04-21 DIAGNOSIS — E6609 Other obesity due to excess calories: Secondary | ICD-10-CM | POA: Diagnosis not present

## 2023-04-21 DIAGNOSIS — M129 Arthropathy, unspecified: Secondary | ICD-10-CM | POA: Diagnosis not present

## 2023-04-21 DIAGNOSIS — G8929 Other chronic pain: Secondary | ICD-10-CM | POA: Diagnosis not present

## 2023-04-24 DIAGNOSIS — Z79899 Other long term (current) drug therapy: Secondary | ICD-10-CM | POA: Diagnosis not present

## 2023-04-29 ENCOUNTER — Telehealth: Payer: Self-pay

## 2023-04-29 NOTE — Telephone Encounter (Signed)
I spoke to the patient and this is documented in another telephone encounter

## 2023-04-29 NOTE — Telephone Encounter (Signed)
Copied from CRM [id number]. Topic: General - Inquiry >> Apr 29, 2023  1:47 PM De Blanch wrote: Reason for CRM: Patient is requesting a call back from Robyne Peers. Pt declined to provide further details stated she knows who I am and why I am calling.  Please review.

## 2023-04-29 NOTE — Telephone Encounter (Signed)
I received a message that the patient was requesting a call back.  I returned her call and she said she has not heard from DSS about Medicaid or [employer] [place].  I told her that I would follow up with both agencies for her.  Messages sent to [facility]/ [employer] [place] and [employer] inquiring about the status of the applications/ requests for call about services.

## 2023-04-30 DIAGNOSIS — E1065 Type 1 diabetes mellitus with hyperglycemia: Secondary | ICD-10-CM | POA: Diagnosis not present

## 2023-05-01 DIAGNOSIS — M47816 Spondylosis without myelopathy or radiculopathy, lumbar region: Secondary | ICD-10-CM | POA: Diagnosis not present

## 2023-05-01 DIAGNOSIS — Z79899 Other long term (current) drug therapy: Secondary | ICD-10-CM | POA: Diagnosis not present

## 2023-05-01 DIAGNOSIS — M5441 Lumbago with sciatica, right side: Secondary | ICD-10-CM | POA: Diagnosis not present

## 2023-05-01 DIAGNOSIS — Z6834 Body mass index (BMI) 34.0-34.9, adult: Secondary | ICD-10-CM | POA: Diagnosis not present

## 2023-05-01 DIAGNOSIS — G8929 Other chronic pain: Secondary | ICD-10-CM | POA: Diagnosis not present

## 2023-05-01 DIAGNOSIS — M21372 Foot drop, left foot: Secondary | ICD-10-CM | POA: Diagnosis not present

## 2023-05-01 DIAGNOSIS — E6609 Other obesity due to excess calories: Secondary | ICD-10-CM | POA: Diagnosis not present

## 2023-05-01 DIAGNOSIS — M5442 Lumbago with sciatica, left side: Secondary | ICD-10-CM | POA: Diagnosis not present

## 2023-05-02 NOTE — Telephone Encounter (Signed)
I spoke to the patient and informed her that I received a message from [facility] [employer] stating that her application is scheduled to be processed next week.  Regarding Medicaid, the patient said that the DSS caseworker called her and reviewed her financial status. She does not think she will qualify for full Medicaid but they are still going to try to submit an application. The patient said that the caseworker is going to send her some information to review.

## 2023-05-06 ENCOUNTER — Encounter: Payer: Self-pay | Admitting: Family Medicine

## 2023-05-06 ENCOUNTER — Other Ambulatory Visit: Payer: Self-pay | Admitting: Family Medicine

## 2023-05-06 ENCOUNTER — Ambulatory Visit: Payer: Medicare HMO | Attending: Family Medicine | Admitting: Family Medicine

## 2023-05-06 VITALS — BP 131/73 | HR 69 | Ht 61.0 in | Wt 186.8 lb

## 2023-05-06 DIAGNOSIS — I5032 Chronic diastolic (congestive) heart failure: Secondary | ICD-10-CM | POA: Insufficient documentation

## 2023-05-06 DIAGNOSIS — Z23 Encounter for immunization: Secondary | ICD-10-CM | POA: Diagnosis not present

## 2023-05-06 DIAGNOSIS — R29898 Other symptoms and signs involving the musculoskeletal system: Secondary | ICD-10-CM | POA: Diagnosis not present

## 2023-05-06 DIAGNOSIS — E1159 Type 2 diabetes mellitus with other circulatory complications: Secondary | ICD-10-CM

## 2023-05-06 DIAGNOSIS — E1065 Type 1 diabetes mellitus with hyperglycemia: Secondary | ICD-10-CM | POA: Diagnosis not present

## 2023-05-06 DIAGNOSIS — N3001 Acute cystitis with hematuria: Secondary | ICD-10-CM

## 2023-05-06 DIAGNOSIS — E785 Hyperlipidemia, unspecified: Secondary | ICD-10-CM

## 2023-05-06 DIAGNOSIS — I25119 Atherosclerotic heart disease of native coronary artery with unspecified angina pectoris: Secondary | ICD-10-CM | POA: Diagnosis not present

## 2023-05-06 DIAGNOSIS — I152 Hypertension secondary to endocrine disorders: Secondary | ICD-10-CM

## 2023-05-06 DIAGNOSIS — E1169 Type 2 diabetes mellitus with other specified complication: Secondary | ICD-10-CM

## 2023-05-06 DIAGNOSIS — E669 Obesity, unspecified: Secondary | ICD-10-CM | POA: Diagnosis not present

## 2023-05-06 DIAGNOSIS — B353 Tinea pedis: Secondary | ICD-10-CM

## 2023-05-06 LAB — POCT URINALYSIS DIP (CLINITEK)
Bilirubin, UA: NEGATIVE
Glucose, UA: 100 mg/dL — AB
Ketones, POC UA: NEGATIVE mg/dL
Leukocytes, UA: NEGATIVE
Nitrite, UA: POSITIVE — AB
POC PROTEIN,UA: 100 — AB
Spec Grav, UA: >=1.03 — AB (ref 1.010–1.025)
Urobilinogen, UA: 1 U/dL
pH, UA: 5.5 (ref 5.0–8.0)

## 2023-05-06 MED ORDER — TERBINAFINE HCL 1 % EX CREA: 1.0000 | TOPICAL_CREAM | Freq: Two times a day (BID) | CUTANEOUS | 0 refills | Status: DC

## 2023-05-06 NOTE — Progress Notes (Signed)
Subjective:  Patient ID: [name], female    DOB: Jan 31, 1966  Age: 57 y.o. MRN: [id number]  CC: Medical Management of Chronic Issues (UTI/Discuss weight)   HPI [name] is a 57 y.o. year old female with a history of Type 1 diabetes mellitus (A1c 10.9 managed by endocrine), diabetic neuropathy, diabetic gastroparesis, hypertension, anemia, anxiety and depression, multiple ED visits and hospitalizations for diabetic gastroparesis, CAD s/p CABG x3 (LIMA to LAD, SVG to PDA, SVG to OM) in 12/2017, R femoral fracture (s/p intramedullary nail in 04/2019).   Interval History: Discussed the use of AI scribe software for clinical note transcription with the patient, who gave verbal consent to proceed.   She presents with concerns about weight gain, weakness in the legs, and recurrent urinary tract infections. The patient reports gaining approximately ten pounds each year and expresses dissatisfaction with her current weight. She has previously been referred to a weight management clinic but was unable to afford the upfront cost $400.  The patient also reports difficulty with physical tasks such as removing and putting on socks due to weakness in the legs. This weakness has been present since the patient sustained fractures to the femur and tibia in separate falls.  She ambulates with a walker.  The patient has been experiencing symptoms suggestive of a urinary tract infection, including cloudy urine and a noticeable odor. She reports having had similar symptoms in the past and believes she may have another infection. The patient has been referred to a urologist in the past for recurrent urinary tract infections but did not follow up due to an outstanding bill with the provider.   Her diabetes is managed by endocrinology and she has an upcoming appointment.  Her neuropathy is stable and is so is her gastroparesis. From a cardiac standpoint she has had no chest pain or dyspnea and remains on her  statin.  She will be seeing cardiology next month.  She does have challenges with her [employer] transportation and states she is unable to afford to pay for private transportation.  She is working with the case Production designer, theatre/television/film on this.       Past Medical History:  Diagnosis Date   AKI (acute kidney injury) (HCC)    Anemia, iron deficiency    Anxiety and depression 05/18/2015   CAD (coronary artery disease), native coronary artery 01/11/2018   s/p CABG - Non-STEMI with severe three-vessel disease noted July 2019   Depression    Diabetic gastroparesis (HCC)    Diabetic neuropathy, type I diabetes mellitus (HCC) 05/18/2015   DKA, type 1 (HCC) 03/18/2016   Essential hypertension    Gastroparesis    GERD (gastroesophageal reflux disease)    HLD (hyperlipidemia)    MDD (major depressive disorder), single episode, severe , no psychosis (HCC)    Tardive dyskinesia    Vitamin B12 deficiency 08/16/2015    Past Surgical History:  Procedure Laterality Date   CARDIAC SURGERY     COLONOSCOPY  09/27/2014   at [facility]   CORONARY ARTERY BYPASS GRAFT N/A 01/14/2018   Procedure: CORONARY ARTERY BYPASS GRAFTING (CABG)X3, RIGHT AND LEFT SAPHENOUS VEIN HARVEST, MAMMARY TAKE DOWN. MAMMARY TO LAD, SVG TO PD, SVG TO DISTAL CIRC.;  Surgeon: Delight Ovens, MD;  Location: [facility] OR;  Service: Open Heart Surgery;  Laterality: N/A;   ESOPHAGOGASTRODUODENOSCOPY  09/27/2014   at [facility], Dr Vashti Hey. biospy neg for celiac, neg for H pylori.    EYE SURGERY     gailstones  INTRAMEDULLARY (IM) NAIL INTERTROCHANTERIC Right 05/10/2019   Procedure: INTRAMEDULLARY (IM) NAIL INTERTROCHANTRIC;  Surgeon: Lyndle Herrlich, MD;  Location: [facility] ORS;  Service: Orthopedics;  Laterality: Right;   IR FLUORO GUIDE CV LINE RIGHT  02/01/2017   IR FLUORO GUIDE CV LINE RIGHT  03/06/2017   IR FLUORO GUIDE CV LINE RIGHT  03/25/2017   IR GENERIC HISTORICAL  01/24/2016   IR FLUORO GUIDE CV LINE RIGHT 01/24/2016 Darrell K Allred, PA-C [facility]INTERV RAD    IR GENERIC HISTORICAL  01/24/2016   IR US GUIDE VASC ACCESS RIGHT 01/24/2016 Darrell K Allred, PA-C [facility]   IR US GUIDE VASC ACCESS RIGHT  02/01/2017   IR US GUIDE VASC ACCESS RIGHT  03/06/2017   IR US GUIDE VASC ACCESS RIGHT  03/25/2017   IR US GUIDE VASC ACCESS RIGHT  01/20/2019   IR VENIPUNCTURE 60YRS/OLDER BY MD  01/20/2019   LEFT HEART CATH AND CORONARY ANGIOGRAPHY N/A 01/07/2018   Procedure: LEFT HEART CATH AND CORONARY ANGIOGRAPHY;  Surgeon: Lennette Bihari, MD;  Location: [facility] INVASIVE CV LAB;  Service: Cardiovascular;  Laterality: N/A;   POSTERIOR VITRECTOMY AND MEMBRANE PEEL-LEFT EYE  09/28/2002   POSTERIOR VITRECTOMY AND MEMBRANE PEEL-RIGHT EYE  03/16/2002   RETINAL DETACHMENT SURGERY     TEE WITHOUT CARDIOVERSION N/A 01/14/2018   Procedure: TRANSESOPHAGEAL ECHOCARDIOGRAM (TEE);  Surgeon: Delight Ovens, MD;  Location: [facility] OR;  Service: Open Heart Surgery;  Laterality: N/A;    Family History  Problem Relation Age of Onset   Cystic fibrosis Mother    Hypertension Father    Diabetes Brother    Hypertension Maternal Grandmother     Social History   Socioeconomic History   Marital status: Single    Spouse name: Not on file   Number of children: 1   Years of education: Not on file   Highest education level: Not on file  Occupational History   Occupation: Unemployed  Tobacco Use   Smoking status: Never   Smokeless tobacco: Never  Vaping Use   Vaping status: Never Used  Substance and Sexual Activity   Alcohol use: No   Drug use: Yes    Types: Marijuana    Comment: occ   Sexual activity: Yes    Partners: Male    Birth control/protection: None  Other Topics Concern   Not on file  Social History Narrative   Not on file   Social Determinants of Health   Financial Resource Strain: Low Risk  (12/26/2022)   Overall Financial Resource Strain (CARDIA)    Difficulty of Paying Living Expenses: Not hard at all  Food Insecurity: No Food Insecurity (12/26/2022)   Hunger Vital  Sign    Worried About Running Out of Food in the Last Year: Never true    Ran Out of Food in the Last Year: Never true  Transportation Needs: No Transportation Needs (12/26/2022)   PRAPARE - Administrator, Civil Service (Medical): No    Lack of Transportation (Non-Medical): No  Physical Activity: Inactive (12/26/2022)   Exercise Vital Sign    Days of Exercise per Week: 0 days    Minutes of Exercise per Session: 0 min  Stress: No Stress Concern Present (12/26/2022)   [employer] of Occupational Health - Occupational Stress Questionnaire    Feeling of Stress : Only a little  Social Connections: Moderately Isolated (12/26/2022)   Social Connection and Isolation Panel [NHANES]    Frequency of Communication with Friends and Family: More than  three times a week    Frequency of Social Gatherings with Friends and Family: Three times a week    Attends Religious Services: 1 to 4 times per year    Active Member of Clubs or Organizations: No    Attends Banker Meetings: Never    Marital Status: Never married    Allergies  Allergen Reactions   Anesthetics, Amide Nausea And Vomiting   Chlorhexidine Other (See Comments)    Unknown reaction   Penicillins Diarrhea, Nausea And Vomiting and Other (See Comments)   Enkaid [Encainide] Nausea And Vomiting   Subutex [Buprenorphine] Rash    Outpatient Medications Prior to Visit  Medication Sig Dispense Refill   acetaminophen (TYLENOL) 500 MG tablet Take 1,000 mg by mouth at bedtime as needed for mild pain.     ammonium lactate (AMLACTIN) 12 % cream Apply 1 Application topically as needed for dry skin. 385 g 2   aspirin EC 81 MG tablet Take 1 tablet (81 mg total) by mouth daily.     furosemide (LASIX) 40 MG tablet TAKE 1 TABLET(40 MG) BY MOUTH DAILY 30 tablet 0   insulin aspart (NOVOLOG) 100 UNIT/ML injection 4-8 units three times a day 15 min before meals 10 mL 5   insulin aspart (NOVOLOG) 100 UNIT/ML injection 100 units in  pump daily 30 mL 3   Insulin Disposable Pump (OMNIPOD 5 G6 PODS, GEN 5,) MISC 1 Device by Does not apply route continuous. 30 each 1   insulin glargine (LANTUS) 100 UNIT/ML injection Lantus 9 units bid 10 mL 1   Insulin Syringe-Needle U-100 (BD INSULIN SYRINGE U/F) 31G X 5/16" 0.5 ML MISC USE FOUR TIMES DAILY FOR INSULIN 400 each 0   Insulin Syringes, Disposable, U-100 0.3 ML MISC Use to draw up insulin as instructed 200 each 5   losartan (COZAAR) 25 MG tablet Take 1 tablet (25 mg total) by mouth daily. Hold this medication if your systolic blood pressure ( top number) s less than 100. Please call your doctor is your systolic blood pressure is less than 100 30 tablet 0   metoCLOPramide (REGLAN) 10 MG tablet TAKE 1 TABLET(10 MG) BY MOUTH THREE TIMES DAILY BEFORE MEALS 90 tablet 1   metoprolol tartrate (LOPRESSOR) 25 MG tablet TAKE 1/2 TABLET BY MOUTH TWICE DAILY. CHECK YOUR BLOOD PRESSURE TWICE DAILY. HOLD THIS MEDICATION IF SYSTOLIC BLOOD PRESSURE IS LESS THAN 30 tablet 0   ondansetron (ZOFRAN) 4 MG tablet Take 1 tablet (4 mg total) by mouth every 8 (eight) hours as needed for nausea or vomiting. 90 tablet 1   pregabalin (LYRICA) 150 MG capsule Take 1 capsule (150 mg total) by mouth 2 (two) times daily. 180 capsule 3   rosuvastatin (CRESTOR) 20 MG tablet TAKE 1 TABLET(20 MG) BY MOUTH DAILY 30 tablet 0   silver sulfADIAZINE (SILVADENE) 1 % cream Apply 1 Application topically daily. 50 g 0   traMADol (ULTRAM) 50 MG tablet Take 1 tablet (50 mg total) by mouth 2 (two) times daily as needed. (Patient not taking: Reported on 05/06/2023) 30 tablet 0   No facility-administered medications prior to visit.     ROS Review of Systems  Constitutional:  Negative for activity change and appetite change.  HENT:  Negative for sinus pressure and sore throat.   Respiratory:  Negative for chest tightness, shortness of breath and wheezing.   Cardiovascular:  Negative for chest pain and palpitations.   Gastrointestinal:  Negative for abdominal distention, abdominal pain and constipation.  Genitourinary: Negative.   Musculoskeletal: Negative.   Psychiatric/Behavioral:  Negative for behavioral problems and dysphoric mood.    Objective:  BP 131/73   Pulse 69   Ht 5\' 1"  (1.549 m)   Wt 186 lb 12.8 oz (84.7 kg)   SpO2 98%   BMI 35.30 kg/m      05/06/2023   11:58 AM 05/06/2023   11:04 AM 02/14/2023    1:38 PM  BP/Weight  Systolic BP 131 147 140  Diastolic BP 73 74 65  Wt. (Lbs)  186.8 183.6  BMI  35.3 kg/m2 34.69 kg/m2      Physical Exam Constitutional:      Appearance: She is well-developed.  Cardiovascular:     Rate and Rhythm: Normal rate.     Heart sounds: Normal heart sounds. No murmur heard. Pulmonary:     Effort: Pulmonary effort is normal.     Breath sounds: Normal breath sounds. No wheezing or rales.  Chest:     Chest wall: No tenderness.  Abdominal:     General: Bowel sounds are normal. There is no distension.     Palpations: Abdomen is soft. There is no mass.     Tenderness: There is no abdominal tenderness.  Musculoskeletal:        General: Normal range of motion.     Right lower leg: Edema present.     Left lower leg: Edema present.  Neurological:     Mental Status: She is alert and oriented to person, place, and time.  Psychiatric:        Mood and Affect: Mood normal.    Diabetic Foot Exam - Simple   Simple Foot Form Diabetic Foot exam was performed with the following findings: Yes 05/06/2023  1:53 PM  Visual Inspection See comments: Yes Sensation Testing Intact to touch and monofilament testing bilaterally: Yes Pulse Check Posterior Tibialis and Dorsalis pulse intact bilaterally: Yes Comments Tinea pedis in third webspace of left foot        Latest Ref Rng & Units 07/04/2022   11:29 AM 01/10/2022    1:03 AM 01/09/2022    7:13 AM  CMP  Glucose 70 - 99 mg/dL 811  914  782   BUN 6 - 23 mg/dL 21  18  15    Creatinine 0.40 - 1.20 mg/dL 9.56   2.13  0.86   Sodium 135 - 145 mEq/L 136  140  142   Potassium 3.5 - 5.1 mEq/L 4.6  4.0  4.1   Chloride 96 - 112 mEq/L 102  106  106   CO2 19 - 32 mEq/L 24  25  24    Calcium 8.4 - 10.5 mg/dL 9.5  8.9  8.9     Lipid Panel     Component Value Date/Time   CHOL 183 08/09/2021 1151   TRIG 104.0 08/09/2021 1151   HDL 57.50 08/09/2021 1151   CHOLHDL 3 08/09/2021 1151   VLDL 20.8 08/09/2021 1151   LDLCALC 105 (H) 08/09/2021 1151   LDLDIRECT 96.0 07/04/2022 1129    CBC    Component Value Date/Time   WBC 5.9 01/10/2022 0103   RBC 4.09 01/10/2022 0103   HGB 10.5 (L) 01/10/2022 0103   HGB 11.4 11/05/2018 1419   HCT 33.5 (L) 01/10/2022 0103   HCT 33.5 (L) 11/05/2018 1419   PLT 236 01/10/2022 0103   PLT 292 11/05/2018 1419   MCV 81.9 01/10/2022 0103   MCV 89 11/05/2018 1419   MCH 25.7 (L) 01/10/2022 0103  MCHC 31.3 01/10/2022 0103   RDW 14.4 01/10/2022 0103   RDW 12.7 11/05/2018 1419   LYMPHSABS 1.5 01/08/2022 1218   MONOABS 0.9 01/08/2022 1218   EOSABS 0.1 01/08/2022 1218   BASOSABS 0.1 01/08/2022 1218    Lab Results  Component Value Date   HGBA1C 10.9 (A) 02/14/2023    Assessment & Plan:  Type 1 diabetes mellitus -Uncontrolled with A1c of 10.9 -Management as per endocrine -Counseled on Diabetic diet, my plate method, 696 minutes of moderate intensity exercise/week Blood sugar logs with fasting goals of 80-120 mg/dl, random of less than 295 and in the event of sugars less than 60 mg/dl or greater than 284 mg/dl encouraged to notify the clinic. Advised on the need for annual eye exams, annual foot exams, Pneumonia vaccine.     Obesity Patient reports annual weight gain and is seeking assistance with weight management. Previous referral to a weight management clinic was not pursued due to upfront cost. -Refer to [facility] weight clinic.  Muscle Weakness Patient reports difficulty with tasks such as removing shoes and socks due to weakness in  legs. -Refer to physical therapy for evaluation and treatment.  Urinary Tract Infection (UTI) Patient reports symptoms suggestive of UTI including cloudy urine and odor. Urinalysis shows slight nitrites, small blood, and cloudy, concentrated urine. -Send urine for culture and sensitivity. -Prescribe appropriate antibiotic based on culture results. -Prior to today she has just had one episode of UTI.  Discussed with her that this does not meet criteria for recurrent UTI.  If she has another recurrence we will refer to urology. -Vulvovaginal symptoms of menopause is also a consideration  Athlete's Foot Noted whitish discoloration between toes indicative of athlete's foot. -Prescribe antifungal cream.  Coronary Artery Disease No current symptoms reported. Patient has not been able to see cardiologist due to transportation issues. -Encourage patient to keep appointments with cardiologist.  Gastroparesis Patient reports symptoms are well-managed. -Encourage patient to keep appointment with gastroenterologist for medication refills.  General Health Maintenance -Administer influenza vaccine today.          No orders of the defined types were placed in this encounter.   Follow-up: Return in about 6 months (around 11/03/2023) for Chronic medical conditions.       Hoy Register, MD, FAAFP. [facility] [place], [place] [phone]   05/06/2023, 1:50 PM

## 2023-05-06 NOTE — Patient Instructions (Signed)
Asymptomatic Bacteriuria Asymptomatic bacteriuria is the presence of a large number of bacteria in the urine without the usual symptoms of burning or frequent urination. This is usually not harmful, and treatment may not be needed. A person with this condition will not be more likely to develop an infection in the future. What are the causes? This condition is caused by an increase in bacteria in the urine. This increase can be caused by: Bacteria entering the urinary tract, such as during sex. A blockage in the urinary tract, such as from kidney stones or a tumor. Bladder problems that prevent the bladder from emptying. What increases the risk? You are more likely to develop this condition if: You have diabetes. You are an older adult. This especially affects older adults in long-term care facilities. You are pregnant and in the first trimester. You have kidney stones. You are female. You have had a kidney transplant. You have a leaky kidney tube valve (reflux). You had a urinary catheter for a long period of time. This is a long, thin tube that collects urine. What are the signs or symptoms? There are no symptoms of this condition. How is this diagnosed? This condition is diagnosed with a urine test. Because this condition does not cause symptoms, it is usually diagnosed when a urine sample is taken to treat or diagnose another condition, such as pregnancy or kidney problems. Most women who are in their first trimester of pregnancy are screened for asymptomatic bacteriuria. How is this treated? Usually, treatment is not needed for this condition. Treating the condition can lead to other problems, such as a yeast infection or the growth of bacteria that do not respond to treatment (antibiotic-resistant bacteria). Some people do need treatment with antibiotic medicines to prevent kidney infection, known as pyelonephritis. Treatment is needed if: You are pregnant. In pregnant women, kidney  infection can lead to: Early labor (premature labor). Very low birth weight (fetal growth restriction). Newborn death. You are having a procedure that affects the urinary tract. You have had a kidney transplant. If you are diagnosed with this condition, talk with your health care provider about any concerns that you have. Follow these instructions at home: Medicines Take over-the-counter and prescription medicines only as told by your health care provider. If you were prescribed an antibiotic medicine, take it as told by your health care provider. Do not stop using the antibiotic even if you start to feel better. General instructions Monitor your condition for any changes. Drink enough fluid to keep your urine pale yellow. Urinate more often to keep your bladder empty. If you are female, keep the area around your vagina and rectum clean. Wipe from front to back after urinating or having a bowel movement. Use each piece of toilet paper only once. Keep all follow-up visits. This is important. Contact a health care provider if: You have symptoms of a urine infection, such as: A burning sensation, or pain when you urinate. A strong need to urinate, or urinating more often. Urine turning discolored or cloudy. Blood in your urine. Urine that smells bad. Get help right away if: You develop signs of a kidney infection, such as: Back pain or pelvic pain. A fever or chills. Nausea or vomiting. Severe pain that cannot be controlled with medicine. Summary Asymptomatic bacteriuria is the presence of a large number of bacteria in the urine without the usual symptoms of burning or frequent urination. Usually, treatment is not needed for this condition. Treating the condition can lead  to other problems, such as a yeast infection or the growth of bacteria that do not respond to treatment. Some people do need treatment. Treatment is needed if you are pregnant, if you are having a procedure that  affects the urinary tract, or if you have had a kidney transplant. If you were prescribed an antibiotic medicine, take it as told by your health care provider. Do not stop using the antibiotic even if you start to feel better. This information is not intended to replace advice given to you by your health care provider. Make sure you discuss any questions you have with your health care provider. Document Revised: 01/17/2020 Document Reviewed: 01/22/2020 Elsevier Patient Education  2024 [employer].

## 2023-05-07 DIAGNOSIS — M5442 Lumbago with sciatica, left side: Secondary | ICD-10-CM | POA: Diagnosis not present

## 2023-05-07 DIAGNOSIS — E119 Type 2 diabetes mellitus without complications: Secondary | ICD-10-CM | POA: Diagnosis not present

## 2023-05-07 DIAGNOSIS — Z6833 Body mass index (BMI) 33.0-33.9, adult: Secondary | ICD-10-CM | POA: Diagnosis not present

## 2023-05-07 DIAGNOSIS — M47816 Spondylosis without myelopathy or radiculopathy, lumbar region: Secondary | ICD-10-CM | POA: Diagnosis not present

## 2023-05-07 DIAGNOSIS — M542 Cervicalgia: Secondary | ICD-10-CM | POA: Diagnosis not present

## 2023-05-07 DIAGNOSIS — M549 Dorsalgia, unspecified: Secondary | ICD-10-CM | POA: Diagnosis not present

## 2023-05-07 DIAGNOSIS — G8929 Other chronic pain: Secondary | ICD-10-CM | POA: Diagnosis not present

## 2023-05-07 DIAGNOSIS — M5441 Lumbago with sciatica, right side: Secondary | ICD-10-CM | POA: Diagnosis not present

## 2023-05-07 DIAGNOSIS — Z794 Long term (current) use of insulin: Secondary | ICD-10-CM | POA: Diagnosis not present

## 2023-05-07 DIAGNOSIS — Z79899 Other long term (current) drug therapy: Secondary | ICD-10-CM | POA: Diagnosis not present

## 2023-05-09 ENCOUNTER — Other Ambulatory Visit: Payer: Self-pay | Admitting: Family Medicine

## 2023-05-09 LAB — CMP14+EGFR
ALT: 14 [IU]/L (ref 0–32)
AST: 21 [IU]/L (ref 0–40)
Albumin: 3.7 g/dL — ABNORMAL LOW (ref 3.8–4.9)
Alkaline Phosphatase: 134 [IU]/L — ABNORMAL HIGH (ref 44–121)
BUN/Creatinine Ratio: 13 (ref 9–23)
BUN: 18 mg/dL (ref 6–24)
Bilirubin Total: 0.4 mg/dL (ref 0.0–1.2)
CO2: 21 mmol/L (ref 20–29)
Calcium: 9.1 mg/dL (ref 8.7–10.2)
Chloride: 102 mmol/L (ref 96–106)
Creatinine, Ser: 1.36 mg/dL — ABNORMAL HIGH (ref 0.57–1.00)
Globulin, Total: 3.5 g/dL (ref 1.5–4.5)
Glucose: 193 mg/dL — ABNORMAL HIGH (ref 70–99)
Potassium: 4.5 mmol/L (ref 3.5–5.2)
Sodium: 139 mmol/L (ref 134–144)
Total Protein: 7.2 g/dL (ref 6.0–8.5)
eGFR: 46 mL/min/{1.73_m2} — ABNORMAL LOW (ref >59)

## 2023-05-09 LAB — MICROALBUMIN / CREATININE URINE RATIO
Creatinine, Urine: 211 mg/dL
Microalb/Creat Ratio: 165 mg/g{creat} — ABNORMAL HIGH (ref 0–29)
Microalbumin, Urine: 348.6 ug/mL

## 2023-05-09 LAB — URINE CULTURE

## 2023-05-09 MED ORDER — CIPROFLOXACIN HCL 500 MG PO TABS
500.0000 mg | ORAL_TABLET | Freq: Two times a day (BID) | ORAL | 0 refills | Status: AC
Start: 2023-05-09 — End: 2023-05-12

## 2023-05-10 ENCOUNTER — Other Ambulatory Visit: Payer: Self-pay

## 2023-05-10 ENCOUNTER — Telehealth: Payer: Self-pay | Admitting: *Deleted

## 2023-05-10 DIAGNOSIS — B3731 Acute candidiasis of vulva and vagina: Secondary | ICD-10-CM

## 2023-05-10 LAB — HM DIABETES EYE EXAM

## 2023-05-10 MED ORDER — FLUCONAZOLE 150 MG PO TABS: 150.0000 mg | ORAL_TABLET | Freq: Once | ORAL | 0 refills | Status: DC

## 2023-05-10 MED ORDER — FLUCONAZOLE 150 MG PO TABS: 150.0000 mg | ORAL_TABLET | Freq: Once | ORAL | 0 refills | Status: AC

## 2023-05-10 NOTE — Telephone Encounter (Signed)
Spoke with patient . Patient aware that Diflucan has been sent to the [facility]. Weight lost management referral contact information also given to patient . Sent Referral to   [facility] [address] [place], [place] [address] Phone: [phone] Fax: [phone]

## 2023-05-10 NOTE — Telephone Encounter (Signed)
Pt given lab results per notes of Dr. Alvis Lemmings from 05/09/23 on 05/10/23. Pt verbalized understanding of message: Please inform her that I sent a prescription for ciprofloxacin to the pharmacy for treatment of her UTI.  Thank you.   Please advise regarding weight loss referral. Patient has not received a call from anyone.  Patient also requesting if any STI showed up on testing. In review of chart no STI testing completed. Please advise. Patient concerned she may develop yeast infection after antibiotics. Promoted to eat probiotic yogurt while taking antibiotics . Patient reports she does not like yogurt. Recommended if sx develop call back.

## 2023-05-10 NOTE — Telephone Encounter (Signed)
Can you please cal in Diflucan from her chart to the pharmacy? It keeps printing as it states Prescription is originating from [facility]. Thanks.

## 2023-05-14 ENCOUNTER — Telehealth: Payer: Self-pay

## 2023-05-14 NOTE — Patient Outreach (Signed)
Attempted to contact patient regarding DM eye, MM. Left voicemail for patient to return my call at [phone].  Nicholes Rough, CMA Care Guide [employer]

## 2023-05-15 ENCOUNTER — Telehealth: Payer: Self-pay | Admitting: Family Medicine

## 2023-05-15 NOTE — Telephone Encounter (Signed)
Duplicate message. 

## 2023-05-15 NOTE — Telephone Encounter (Signed)
FYI

## 2023-05-15 NOTE — Telephone Encounter (Signed)
Copied from CRM [id number]. Topic: General - Other >> May 15, 2023  1:20 PM Phill Myron wrote: Attn: Erskine Squibb  Please call Ms Interrante (decline to leave msg)

## 2023-05-16 NOTE — Telephone Encounter (Signed)
I called patient and she informed me that she contacted [facility] and they told her she has been approved for services. She said she is on her way to her mailbox now because she has not checked her mail in a while and she should have information from them about how to schedule rides, pay for rides, etc.

## 2023-05-20 ENCOUNTER — Ambulatory Visit: Payer: Medicare HMO | Admitting: Endocrinology

## 2023-05-20 ENCOUNTER — Encounter: Payer: Self-pay | Admitting: Endocrinology

## 2023-05-20 VITALS — BP 136/60 | HR 83 | Resp 20 | Ht 61.0 in | Wt 187.4 lb

## 2023-05-20 DIAGNOSIS — E1065 Type 1 diabetes mellitus with hyperglycemia: Secondary | ICD-10-CM | POA: Diagnosis not present

## 2023-05-20 LAB — POCT GLYCOSYLATED HEMOGLOBIN (HGB A1C): Hemoglobin A1C: 9.8 % — AB (ref 4.0–5.6)

## 2023-05-20 MED ORDER — INSULIN GLARGINE 100 UNIT/ML ~~LOC~~ SOLN: SUBCUTANEOUS | 1 refills | Status: DC

## 2023-05-20 MED ORDER — INSULIN ASPART 100 UNIT/ML IJ SOLN: INTRAMUSCULAR | 3 refills | Status: DC

## 2023-05-20 NOTE — Progress Notes (Signed)
Outpatient Endocrinology Note [place] Brelyn Woehl, MD  05/20/23  Patient's Name: [name]    DOB: October 10, 1965    MRN: [id number]                                                    REASON OF VISIT: Follow up for type 1 diabetes mellitus  PCP: Hoy Register, MD  HISTORY OF PRESENT ILLNESS:   [name] is a 57 y.o. old female with past medical history listed below, is here for follow up for type 1 diabetes mellitus.  Patient was previously seen by Dr. Lucianne Muss and Dr. Loraine Leriche 20.  Pertinent Diabetes History: Patient was diagnosed with type 1 diabetes mellitus in 1977.  Patient had periodic episodes of diabetes ketoacidosis in the past.  Patient used to be on OmniPod Dash in the past was started in January 2021 was discontinued due to cost issue.  Lately patient has been on multidose insulin regimen.  Chronic Diabetes Complications : Retinopathy: yes. Last ophthalmology exam was done on ?, following with ophthalmology regularly ?Marland Kitchen  Retinal detachment. Nephropathy: Microalbuminuria, CKD 3A on ACE/ARB /losartan Peripheral neuropathy: yes, on pregabalin, she has been seeing podiatry as well. Coronary artery disease: yes s/p CABG in 2019.  Stroke: yes She has gastroparesis treated with Zofran as needed.  Also on Reglan with good relief of symptoms.  Relevant comorbidities and cardiovascular risk factors: Obesity: yes Body mass index is 35.41 kg/m.  Hypertension: Yes  Hyperlipidemia : Yes, on statin   Current / Home Diabetic regimen includes:  Lantus 9 units two times a day. Novolog sliding scale 4 - 12 units with meals.  NovoLog carb ratio 1 : 15 + sliding scale, based on 2:50, means 2 unit for every 50 of glucose.  Prior diabetic medications: OmniPod Dash. OmniPod 4 insulin pump since 07/07/2019, currently not using since early 2023.   Basal rates:  MN: 0.75, 10AM; 1.0, 10:30AM: 1.2, 4PM: 0.75, 7PM: 0.80,  I/C: 1, ISF: 30, timing 4 hours, target: MN: 120 with correction over  140.  Previously plan for OmniPod 5 however did not have an ability because of her phone compatibility.  Glycemic data:    CONTINUOUS GLUCOSE MONITORING SYSTEM (CGMS) INTERPRETATION: At today's visit, we reviewed CGM downloads. The full report is scanned in the media. Reviewing the CGM trends, blood glucose are as follows:  Dexcom G6 CGM-  Sensor Download (Sensor download was reviewed and summarized below.) Dates: November 12 to May 20, 2023, 14 days  Glucose Management Indicator: 7.6% Sensor Average: 179 SD 59 Sensor usage : 100 %  Glycemic Trends:  <54: 0% 54-70: 2% 71-180: 53% 181-250: 33% 251-400: 12%  Interpretation: -Variable blood sugar with frequent hyperglycemia with blood sugar up to 250-300 range related with meals.  She has mostly acceptable blood sugar in between the meals and overnight.  Infrequent hypoglycemia with blood sugar in 50-60 range seems to be related with mealtime bolus or correctional bolus.  Most of the early morning blood sugar are acceptable.  Hypoglycemia: Patient has minor hypoglycemic episodes. Patient has hypoglycemia awareness.  Factors modifying glucose control: 1.  Diabetic diet assessment: 3 meals a day.  2.  Staying active or exercising:   3.  Medication compliance: compliant most of the time.  Interval history  Patient is currently on basal bolus regimen as noted above.  She has been on Dexcom G6 and review data as above.  She used to be on insulin pump OmniPod Dash.  She was waiting to have OmniPod 5 with a compatible phone and for Dexcom G6.  She currently has phone compatible with OmniPod and also for Dexcom CGM.  She likes to restart insulin pump therapy.  REVIEW OF SYSTEMS As per history of present illness.   PAST MEDICAL HISTORY: Past Medical History:  Diagnosis Date   AKI (acute kidney injury) (HCC)    Anemia, iron deficiency    Anxiety and depression 05/18/2015   CAD (coronary artery disease), native coronary artery  01/11/2018   s/p CABG - Non-STEMI with severe three-vessel disease noted July 2019   Depression    Diabetic gastroparesis (HCC)    Diabetic neuropathy, type I diabetes mellitus (HCC) 05/18/2015   DKA, type 1 (HCC) 03/18/2016   Essential hypertension    Gastroparesis    GERD (gastroesophageal reflux disease)    HLD (hyperlipidemia)    MDD (major depressive disorder), single episode, severe , no psychosis (HCC)    Tardive dyskinesia    Vitamin B12 deficiency 08/16/2015    PAST SURGICAL HISTORY: Past Surgical History:  Procedure Laterality Date   CARDIAC SURGERY     COLONOSCOPY  09/27/2014   at [facility]   CORONARY ARTERY BYPASS GRAFT N/A 01/14/2018   Procedure: CORONARY ARTERY BYPASS GRAFTING (CABG)X3, RIGHT AND LEFT SAPHENOUS VEIN HARVEST, MAMMARY TAKE DOWN. MAMMARY TO LAD, SVG TO PD, SVG TO DISTAL CIRC.;  Surgeon: Delight Ovens, MD;  Location: [facility] OR;  Service: Open Heart Surgery;  Laterality: N/A;   ESOPHAGOGASTRODUODENOSCOPY  09/27/2014   at [facility], Dr Vashti Hey. biospy neg for celiac, neg for H pylori.    EYE SURGERY     gailstones     INTRAMEDULLARY (IM) NAIL INTERTROCHANTERIC Right 05/10/2019   Procedure: INTRAMEDULLARY (IM) NAIL INTERTROCHANTRIC;  Surgeon: Lyndle Herrlich, MD;  Location: [facility] ORS;  Service: Orthopedics;  Laterality: Right;   IR FLUORO GUIDE CV LINE RIGHT  02/01/2017   IR FLUORO GUIDE CV LINE RIGHT  03/06/2017   IR FLUORO GUIDE CV LINE RIGHT  03/25/2017   IR GENERIC HISTORICAL  01/24/2016   IR FLUORO GUIDE CV LINE RIGHT 01/24/2016 Darrell K Allred, PA-C [facility]   IR GENERIC HISTORICAL  01/24/2016   IR US GUIDE VASC ACCESS RIGHT 01/24/2016 Darrell K Allred, PA-C [facility]   IR US GUIDE VASC ACCESS RIGHT  02/01/2017   IR US GUIDE VASC ACCESS RIGHT  03/06/2017   IR US GUIDE VASC ACCESS RIGHT  03/25/2017   IR US GUIDE VASC ACCESS RIGHT  01/20/2019   IR VENIPUNCTURE 21YRS/OLDER BY MD  01/20/2019   LEFT HEART CATH AND CORONARY ANGIOGRAPHY N/A 01/07/2018   Procedure:  LEFT HEART CATH AND CORONARY ANGIOGRAPHY;  Surgeon: Lennette Bihari, MD;  Location: [facility] INVASIVE CV LAB;  Service: Cardiovascular;  Laterality: N/A;   POSTERIOR VITRECTOMY AND MEMBRANE PEEL-LEFT EYE  09/28/2002   POSTERIOR VITRECTOMY AND MEMBRANE PEEL-RIGHT EYE  03/16/2002   RETINAL DETACHMENT SURGERY     TEE WITHOUT CARDIOVERSION N/A 01/14/2018   Procedure: TRANSESOPHAGEAL ECHOCARDIOGRAM (TEE);  Surgeon: Delight Ovens, MD;  Location: [facility] OR;  Service: Open Heart Surgery;  Laterality: N/A;    ALLERGIES: Allergies  Allergen Reactions   Anesthetics, Amide Nausea And Vomiting   Chlorhexidine Other (See Comments)    Unknown reaction   Penicillins Diarrhea, Nausea And Vomiting and Other (See Comments)   Enkaid [Encainide]  Nausea And Vomiting   Subutex [Buprenorphine] Rash    FAMILY HISTORY:  Family History  Problem Relation Age of Onset   Cystic fibrosis Mother    Hypertension Father    Diabetes Brother    Hypertension Maternal Grandmother     SOCIAL HISTORY: Social History   Socioeconomic History   Marital status: Single    Spouse name: Not on file   Number of children: 1   Years of education: Not on file   Highest education level: Not on file  Occupational History   Occupation: Unemployed  Tobacco Use   Smoking status: Never   Smokeless tobacco: Never  Vaping Use   Vaping status: Never Used  Substance and Sexual Activity   Alcohol use: No   Drug use: Yes    Types: Marijuana    Comment: occ   Sexual activity: Yes    Partners: Male    Birth control/protection: None  Other Topics Concern   Not on file  Social History Narrative   Not on file   Social Determinants of Health   Financial Resource Strain: Low Risk  (12/26/2022)   Overall Financial Resource Strain (CARDIA)    Difficulty of Paying Living Expenses: Not hard at all  Food Insecurity: No Food Insecurity (12/26/2022)   Hunger Vital Sign    Worried About Running Out of Food in the Last Year: Never true    Ran  Out of Food in the Last Year: Never true  Transportation Needs: No Transportation Needs (12/26/2022)   PRAPARE - Administrator, Civil Service (Medical): No    Lack of Transportation (Non-Medical): No  Physical Activity: Inactive (12/26/2022)   Exercise Vital Sign    Days of Exercise per Week: 0 days    Minutes of Exercise per Session: 0 min  Stress: No Stress Concern Present (12/26/2022)   [employer] of Occupational Health - Occupational Stress Questionnaire    Feeling of Stress : Only a little  Social Connections: Moderately Isolated (12/26/2022)   Social Connection and Isolation Panel [NHANES]    Frequency of Communication with Friends and Family: More than three times a week    Frequency of Social Gatherings with Friends and Family: Three times a week    Attends Religious Services: 1 to 4 times per year    Active Member of Clubs or Organizations: No    Attends Banker Meetings: Never    Marital Status: Never married    MEDICATIONS:  Current Outpatient Medications  Medication Sig Dispense Refill   acetaminophen (TYLENOL) 500 MG tablet Take 1,000 mg by mouth at bedtime as needed for mild pain.     ammonium lactate (AMLACTIN) 12 % cream Apply 1 Application topically as needed for dry skin. 385 g 2   aspirin EC 81 MG tablet Take 1 tablet (81 mg total) by mouth daily.     furosemide (LASIX) 40 MG tablet TAKE 1 TABLET(40 MG) BY MOUTH DAILY 30 tablet 0   insulin aspart (NOVOLOG) 100 UNIT/ML injection 4-8 units three times a day 15 min before meals 10 mL 5   insulin aspart (NOVOLOG) 100 UNIT/ML injection 100 units in pump daily 30 mL 3   insulin glargine (LANTUS) 100 UNIT/ML injection Lantus 9 units bid 10 mL 1   Insulin Syringe-Needle U-100 (BD INSULIN SYRINGE U/F) 31G X 5/16" 0.5 ML MISC USE FOUR TIMES DAILY FOR INSULIN 400 each 0   Insulin Syringes, Disposable, U-100 0.3 ML MISC Use to draw up  insulin as instructed 200 each 5   losartan (COZAAR) 25 MG tablet  Take 1 tablet (25 mg total) by mouth daily. Hold this medication if your systolic blood pressure ( top number) s less than 100. Please call your doctor is your systolic blood pressure is less than 100 30 tablet 0   metoCLOPramide (REGLAN) 10 MG tablet TAKE 1 TABLET(10 MG) BY MOUTH THREE TIMES DAILY BEFORE MEALS 90 tablet 1   metoprolol tartrate (LOPRESSOR) 25 MG tablet TAKE 1/2 TABLET BY MOUTH TWICE DAILY. CHECK YOUR BLOOD PRESSURE TWICE DAILY. HOLD THIS MEDICATION IF SYSTOLIC BLOOD PRESSURE IS LESS THAN 30 tablet 0   ondansetron (ZOFRAN) 4 MG tablet Take 1 tablet (4 mg total) by mouth every 8 (eight) hours as needed for nausea or vomiting. 90 tablet 1   pregabalin (LYRICA) 150 MG capsule Take 1 capsule (150 mg total) by mouth 2 (two) times daily. 180 capsule 3   rosuvastatin (CRESTOR) 20 MG tablet TAKE 1 TABLET(20 MG) BY MOUTH DAILY 30 tablet 0   silver sulfADIAZINE (SILVADENE) 1 % cream Apply 1 Application topically daily. 50 g 0   terbinafine (LAMISIL AT) 1 % cream Apply 1 Application topically 2 (two) times daily. 30 g 0   traMADol (ULTRAM) 50 MG tablet Take 1 tablet (50 mg total) by mouth 2 (two) times daily as needed. 30 tablet 0   Insulin Disposable Pump (OMNIPOD 5 G6 PODS, GEN 5,) MISC 1 Device by Does not apply route continuous. (Patient not taking: Reported on 05/20/2023) 30 each 1   No current facility-administered medications for this visit.    PHYSICAL EXAM: Vitals:   05/20/23 1401  BP: 136/60  Pulse: 83  Resp: 20  SpO2: 95%  Weight: 187 lb 6.4 oz (85 kg)  Height: 5\' 1"  (1.549 m)   Body mass index is 35.41 kg/m.  Wt Readings from Last 3 Encounters:  05/20/23 187 lb 6.4 oz (85 kg)  05/06/23 186 lb 12.8 oz (84.7 kg)  02/14/23 183 lb 9.6 oz (83.3 kg)    General: Well developed, well nourished female in no apparent distress.  HEENT: AT/[place], no external lesions.  Eyes: Conjunctiva clear and no icterus. Neck: Neck supple  Lungs: Respirations not labored Neurologic: Alert,  oriented, normal speech Extremities / Skin: Dry.    Diabetic Foot Exam - Simple   No data filed    LABS Reviewed Lab Results  Component Value Date   HGBA1C 9.8 (A) 05/20/2023   HGBA1C 10.9 (A) 02/14/2023   HGBA1C 10.7 (A) 07/04/2022   No results found for: "FRUCTOSAMINE" Lab Results  Component Value Date   CHOL 183 08/09/2021   HDL 57.50 08/09/2021   LDLCALC 105 (H) 08/09/2021   LDLDIRECT 96.0 07/04/2022   TRIG 104.0 08/09/2021   CHOLHDL 3 08/09/2021   Lab Results  Component Value Date   MICRALBCREAT 165 (H) 05/06/2023   MICRALBCREAT 17.9 01/02/2022   Lab Results  Component Value Date   CREATININE 1.36 (H) 05/06/2023   Lab Results  Component Value Date   GFR 51.61 (L) 07/04/2022    ASSESSMENT / PLAN  1. Uncontrolled type 1 diabetes mellitus with hyperglycemia (HCC)     Diabetes Mellitus type 1, complicated by CAD/nephropathy/neuropathy/retinopathy. - Diabetic status / severity: Uncontrolled.  Lab Results  Component Value Date   HGBA1C 9.8 (A) 05/20/2023    - Hemoglobin A1c goal : <7%  She has slight improvement on diabetes control, hemoglobin A1c today 9.8%.  She has occasional hypoglycemia as well.  She wanted to restart on report insulin pump therapy.  She now has phone compatible and also has Dexcom G6.  - Medications: See below.  I) continue Lantus 9 units 2 times a day. II) continue NovoLog as per carb ratio 1 : 15+ sliding scale with meals 3 times a day. Adjusted sliding scale as follows to avoid hypoglycemia.  Her current sliding scale is 2 :50, needs to unit for every 50 of glucose.  Mild Sliding Scale Blood Glucose        Insulin 60-150                     None 151-200                   1 unit 201-250                   2 units 251-300                   4 units 301-350                   6 units 351-400                   8 units      >400                        9 units and call provider     -Patient will start on OmniPod 5 with Dexcom  G6 insulin pump after visit with diabetic educator, patient will call to have visit.  Recommend to keep insulin pump setting as follows.  Basal rates:  MN: 0.75, 10AM; 1.0, 10:30AM: 1.2, 4PM: 0.75, 7PM: 0.80,  I/C: 1:15, ISF: 50, active insulin time 4 hours, Target: MN: 120 with correction over 140.  - Home glucose testing: CGM and check as needed. - Discussed/ Gave Hypoglycemia treatment plan.  # Consult : not required at this time.  She will follow-up with diabetic educator for starting insulin pump OmniPod 5.  # Annual urine for microalbuminuria/ creatinine ratio,microalbuminuria currently, continue ACE/ARB /losartan. Last  Lab Results  Component Value Date   MICRALBCREAT 165 (H) 05/06/2023    # Foot check nightly / neuropathy, continue pregabalin.  # Patient has diabetic retinopathy, advised to follow-up with ophthalmology regularly..   - Diet: Make healthy diabetic food choices - Life style / activity / exercise: Discussed.  2. Blood pressure  -  BP Readings from Last 1 Encounters:  05/20/23 136/60    - Control is in target.  - No change in current plans.  3. Lipid status / Hyperlipidemia - Last  Lab Results  Component Value Date   LDLCALC 105 (H) 08/09/2021   - Continue rosuvastatin 20 mg daily.  Managed by primary care provider /cardiology.  Diagnoses and all orders for this visit:  Uncontrolled type 1 diabetes mellitus with hyperglycemia (HCC) -     POCT glycosylated hemoglobin (Hb A1C)    DISPOSITION Follow up in clinic in   6 weeks suggested.   All questions answered and patient verbalized understanding of the plan.  [place] Kieran Arreguin, MD [facility] Endocrinology [facility] [address] [place], [address] [place], [place] [address] Phone # [phone]  At least part of this note was generated using voice recognition software. Inadvertent word errors may have occurred, which were not recognized during the proofreading process.

## 2023-05-20 NOTE — Patient Instructions (Addendum)
Diabetes regimen:  Lantus 9 units two times a day. Novolog  as per carb ratio 1:15 plus sliding scale with meals three times a day.  Mild Sliding Scale Blood Glucose        Insulin 60-150                     None 151-200                   1 unit 201-250                   2 units 251-300                   4 units 301-350                   6 units 351-400                   8 units      >400                        9 units and call provider    Have visit with diabetes educator / Bonita Quin to start OmniPod 5.

## 2023-05-22 DIAGNOSIS — I10 Essential (primary) hypertension: Secondary | ICD-10-CM | POA: Diagnosis not present

## 2023-05-22 DIAGNOSIS — R1084 Generalized abdominal pain: Secondary | ICD-10-CM | POA: Diagnosis not present

## 2023-05-22 DIAGNOSIS — R000 Tachycardia, unspecified: Secondary | ICD-10-CM | POA: Diagnosis not present

## 2023-05-22 DIAGNOSIS — R0689 Other abnormalities of breathing: Secondary | ICD-10-CM | POA: Diagnosis not present

## 2023-05-22 DIAGNOSIS — R739 Hyperglycemia, unspecified: Secondary | ICD-10-CM | POA: Diagnosis not present

## 2023-05-23 ENCOUNTER — Encounter (HOSPITAL_COMMUNITY): Payer: Self-pay

## 2023-05-23 ENCOUNTER — Emergency Department (HOSPITAL_COMMUNITY): Payer: Medicare HMO

## 2023-05-23 ENCOUNTER — Inpatient Hospital Stay (HOSPITAL_COMMUNITY)
Admission: EM | Admit: 2023-05-23 | Discharge: 2023-05-26 | DRG: 638 | Disposition: A | Discharge status: Home / Self Care | Payer: Medicare HMO | Attending: Internal Medicine | Admitting: Internal Medicine

## 2023-05-23 ENCOUNTER — Other Ambulatory Visit: Payer: Self-pay | Admitting: Cardiovascular Disease

## 2023-05-23 DIAGNOSIS — R933 Abnormal findings on diagnostic imaging of other parts of digestive tract: Secondary | ICD-10-CM | POA: Diagnosis present

## 2023-05-23 DIAGNOSIS — I252 Old myocardial infarction: Secondary | ICD-10-CM

## 2023-05-23 DIAGNOSIS — Z8249 Family history of ischemic heart disease and other diseases of the circulatory system: Secondary | ICD-10-CM

## 2023-05-23 DIAGNOSIS — K861 Other chronic pancreatitis: Secondary | ICD-10-CM | POA: Diagnosis not present

## 2023-05-23 DIAGNOSIS — R000 Tachycardia, unspecified: Secondary | ICD-10-CM | POA: Diagnosis not present

## 2023-05-23 DIAGNOSIS — I2510 Atherosclerotic heart disease of native coronary artery without angina pectoris: Secondary | ICD-10-CM | POA: Diagnosis present

## 2023-05-23 DIAGNOSIS — E860 Dehydration: Secondary | ICD-10-CM | POA: Diagnosis present

## 2023-05-23 DIAGNOSIS — Z87440 Personal history of urinary (tract) infections: Secondary | ICD-10-CM

## 2023-05-23 DIAGNOSIS — Z79899 Other long term (current) drug therapy: Secondary | ICD-10-CM | POA: Diagnosis not present

## 2023-05-23 DIAGNOSIS — K449 Diaphragmatic hernia without obstruction or gangrene: Secondary | ICD-10-CM | POA: Diagnosis not present

## 2023-05-23 DIAGNOSIS — N179 Acute kidney failure, unspecified: Secondary | ICD-10-CM | POA: Diagnosis not present

## 2023-05-23 DIAGNOSIS — Z794 Long term (current) use of insulin: Secondary | ICD-10-CM

## 2023-05-23 DIAGNOSIS — K3184 Gastroparesis: Secondary | ICD-10-CM | POA: Diagnosis present

## 2023-05-23 DIAGNOSIS — Z8349 Family history of other endocrine, nutritional and metabolic diseases: Secondary | ICD-10-CM

## 2023-05-23 DIAGNOSIS — E1065 Type 1 diabetes mellitus with hyperglycemia: Secondary | ICD-10-CM | POA: Insufficient documentation

## 2023-05-23 DIAGNOSIS — E1043 Type 1 diabetes mellitus with diabetic autonomic (poly)neuropathy: Secondary | ICD-10-CM | POA: Diagnosis present

## 2023-05-23 DIAGNOSIS — R110 Nausea: Secondary | ICD-10-CM | POA: Diagnosis not present

## 2023-05-23 DIAGNOSIS — R109 Unspecified abdominal pain: Secondary | ICD-10-CM | POA: Diagnosis not present

## 2023-05-23 DIAGNOSIS — I130 Hypertensive heart and chronic kidney disease with heart failure and stage 1 through stage 4 chronic kidney disease, or unspecified chronic kidney disease: Secondary | ICD-10-CM | POA: Diagnosis present

## 2023-05-23 DIAGNOSIS — Z888 Allergy status to other drugs, medicaments and biological substances status: Secondary | ICD-10-CM

## 2023-05-23 DIAGNOSIS — E538 Deficiency of other specified B group vitamins: Secondary | ICD-10-CM | POA: Diagnosis present

## 2023-05-23 DIAGNOSIS — E1010 Type 1 diabetes mellitus with ketoacidosis without coma: Secondary | ICD-10-CM | POA: Diagnosis not present

## 2023-05-23 DIAGNOSIS — E1122 Type 2 diabetes mellitus with diabetic chronic kidney disease: Secondary | ICD-10-CM | POA: Diagnosis not present

## 2023-05-23 DIAGNOSIS — K529 Noninfective gastroenteritis and colitis, unspecified: Secondary | ICD-10-CM | POA: Diagnosis present

## 2023-05-23 DIAGNOSIS — I5032 Chronic diastolic (congestive) heart failure: Secondary | ICD-10-CM | POA: Diagnosis present

## 2023-05-23 DIAGNOSIS — N1831 Chronic kidney disease, stage 3a: Secondary | ICD-10-CM | POA: Diagnosis not present

## 2023-05-23 DIAGNOSIS — Z951 Presence of aortocoronary bypass graft: Secondary | ICD-10-CM | POA: Diagnosis not present

## 2023-05-23 DIAGNOSIS — K5900 Constipation, unspecified: Secondary | ICD-10-CM | POA: Diagnosis present

## 2023-05-23 DIAGNOSIS — Z833 Family history of diabetes mellitus: Secondary | ICD-10-CM | POA: Diagnosis not present

## 2023-05-23 DIAGNOSIS — Z7982 Long term (current) use of aspirin: Secondary | ICD-10-CM

## 2023-05-23 DIAGNOSIS — K219 Gastro-esophageal reflux disease without esophagitis: Secondary | ICD-10-CM | POA: Diagnosis present

## 2023-05-23 DIAGNOSIS — D259 Leiomyoma of uterus, unspecified: Secondary | ICD-10-CM | POA: Diagnosis not present

## 2023-05-23 DIAGNOSIS — K5730 Diverticulosis of large intestine without perforation or abscess without bleeding: Secondary | ICD-10-CM | POA: Diagnosis not present

## 2023-05-23 DIAGNOSIS — Z560 Unemployment, unspecified: Secondary | ICD-10-CM

## 2023-05-23 DIAGNOSIS — K760 Fatty (change of) liver, not elsewhere classified: Secondary | ICD-10-CM | POA: Diagnosis present

## 2023-05-23 DIAGNOSIS — Z880 Allergy status to penicillin: Secondary | ICD-10-CM

## 2023-05-23 DIAGNOSIS — E1110 Type 2 diabetes mellitus with ketoacidosis without coma: Secondary | ICD-10-CM | POA: Diagnosis not present

## 2023-05-23 DIAGNOSIS — E876 Hypokalemia: Secondary | ICD-10-CM | POA: Diagnosis present

## 2023-05-23 DIAGNOSIS — K429 Umbilical hernia without obstruction or gangrene: Secondary | ICD-10-CM | POA: Diagnosis not present

## 2023-05-23 DIAGNOSIS — R1013 Epigastric pain: Secondary | ICD-10-CM | POA: Diagnosis not present

## 2023-05-23 DIAGNOSIS — R319 Hematuria, unspecified: Secondary | ICD-10-CM | POA: Diagnosis present

## 2023-05-23 DIAGNOSIS — E1022 Type 1 diabetes mellitus with diabetic chronic kidney disease: Secondary | ICD-10-CM | POA: Diagnosis present

## 2023-05-23 DIAGNOSIS — E785 Hyperlipidemia, unspecified: Secondary | ICD-10-CM | POA: Diagnosis present

## 2023-05-23 LAB — BASIC METABOLIC PANEL
Anion gap: 18 — ABNORMAL HIGH (ref 5–15)
Anion gap: 17 — ABNORMAL HIGH (ref 5–15)
Anion gap: 13 (ref 5–15)
Anion gap: 9 (ref 5–15)
Anion gap: 7 (ref 5–15)
BUN: 29 mg/dL — ABNORMAL HIGH (ref 6–20)
BUN: 29 mg/dL — ABNORMAL HIGH (ref 6–20)
BUN: 27 mg/dL — ABNORMAL HIGH (ref 6–20)
BUN: 30 mg/dL — ABNORMAL HIGH (ref 6–20)
BUN: 27 mg/dL — ABNORMAL HIGH (ref 6–20)
CO2: 16 mmol/L — ABNORMAL LOW (ref 22–32)
CO2: 14 mmol/L — ABNORMAL LOW (ref 22–32)
CO2: 20 mmol/L — ABNORMAL LOW (ref 22–32)
CO2: 19 mmol/L — ABNORMAL LOW (ref 22–32)
CO2: 23 mmol/L (ref 22–32)
Calcium: 8.9 mg/dL (ref 8.9–10.3)
Calcium: 8.5 mg/dL — ABNORMAL LOW (ref 8.9–10.3)
Calcium: 8.6 mg/dL — ABNORMAL LOW (ref 8.9–10.3)
Calcium: 8.8 mg/dL — ABNORMAL LOW (ref 8.9–10.3)
Calcium: 8.7 mg/dL — ABNORMAL LOW (ref 8.9–10.3)
Chloride: 108 mmol/L (ref 98–111)
Chloride: 112 mmol/L — ABNORMAL HIGH (ref 98–111)
Chloride: 108 mmol/L (ref 98–111)
Chloride: 117 mmol/L — ABNORMAL HIGH (ref 98–111)
Chloride: 115 mmol/L — ABNORMAL HIGH (ref 98–111)
Creatinine, Ser: 1.72 mg/dL — ABNORMAL HIGH (ref 0.44–1.00)
Creatinine, Ser: 1.66 mg/dL — ABNORMAL HIGH (ref 0.44–1.00)
Creatinine, Ser: 1.68 mg/dL — ABNORMAL HIGH (ref 0.44–1.00)
Creatinine, Ser: 1.63 mg/dL — ABNORMAL HIGH (ref 0.44–1.00)
Creatinine, Ser: 1.61 mg/dL — ABNORMAL HIGH (ref 0.44–1.00)
GFR, Estimated: 34 mL/min — ABNORMAL LOW (ref >60)
GFR, Estimated: 36 mL/min — ABNORMAL LOW (ref >60)
GFR, Estimated: 35 mL/min — ABNORMAL LOW (ref >60)
GFR, Estimated: 37 mL/min — ABNORMAL LOW (ref >60)
GFR, Estimated: 37 mL/min — ABNORMAL LOW (ref >60)
Glucose, Bld: 409 mg/dL — ABNORMAL HIGH (ref 70–99)
Glucose, Bld: 397 mg/dL — ABNORMAL HIGH (ref 70–99)
Glucose, Bld: 393 mg/dL — ABNORMAL HIGH (ref 70–99)
Glucose, Bld: 219 mg/dL — ABNORMAL HIGH (ref 70–99)
Glucose, Bld: 132 mg/dL — ABNORMAL HIGH (ref 70–99)
Potassium: 4 mmol/L (ref 3.5–5.1)
Potassium: 5 mmol/L (ref 3.5–5.1)
Potassium: 4.2 mmol/L (ref 3.5–5.1)
Potassium: 4.1 mmol/L (ref 3.5–5.1)
Potassium: 4.4 mmol/L (ref 3.5–5.1)
Sodium: 142 mmol/L (ref 135–145)
Sodium: 143 mmol/L (ref 135–145)
Sodium: 141 mmol/L (ref 135–145)
Sodium: 145 mmol/L (ref 135–145)
Sodium: 145 mmol/L (ref 135–145)

## 2023-05-23 LAB — CBC
HCT: 42.1 % (ref 36.0–46.0)
Hemoglobin: 13.7 g/dL (ref 12.0–15.0)
MCH: 27.8 pg (ref 26.0–34.0)
MCHC: 32.5 g/dL (ref 30.0–36.0)
MCV: 85.6 fL (ref 80.0–100.0)
Platelets: 302 10*3/uL (ref 150–400)
RBC: 4.92 MIL/uL (ref 3.87–5.11)
RDW: 13.2 % (ref 11.5–15.5)
WBC: 9.5 10*3/uL (ref 4.0–10.5)
nRBC: 0 % (ref 0.0–0.2)

## 2023-05-23 LAB — COMPREHENSIVE METABOLIC PANEL
ALT: 19 U/L (ref 0–44)
AST: 28 U/L (ref 15–41)
Albumin: 3.7 g/dL (ref 3.5–5.0)
Alkaline Phosphatase: 123 U/L (ref 38–126)
Anion gap: 18 — ABNORMAL HIGH (ref 5–15)
BUN: 22 mg/dL — ABNORMAL HIGH (ref 6–20)
CO2: 17 mmol/L — ABNORMAL LOW (ref 22–32)
Calcium: 9.5 mg/dL (ref 8.9–10.3)
Chloride: 105 mmol/L (ref 98–111)
Creatinine, Ser: 1.73 mg/dL — ABNORMAL HIGH (ref 0.44–1.00)
GFR, Estimated: 34 mL/min — ABNORMAL LOW (ref >60)
Glucose, Bld: 412 mg/dL — ABNORMAL HIGH (ref 70–99)
Potassium: 3.3 mmol/L — ABNORMAL LOW (ref 3.5–5.1)
Sodium: 140 mmol/L (ref 135–145)
Total Bilirubin: 1.7 mg/dL — ABNORMAL HIGH (ref <1.2)
Total Protein: 8.5 g/dL — ABNORMAL HIGH (ref 6.5–8.1)

## 2023-05-23 LAB — URINALYSIS, ROUTINE W REFLEX MICROSCOPIC
Bacteria, UA: NONE SEEN
Bilirubin Urine: NEGATIVE
Glucose, UA: >=500 mg/dL — AB
Ketones, ur: 80 mg/dL — AB
Leukocytes,Ua: NEGATIVE
Nitrite: NEGATIVE
Protein, ur: >=300 mg/dL — AB
Specific Gravity, Urine: >1.046 — ABNORMAL HIGH (ref 1.005–1.030)
pH: 6 (ref 5.0–8.0)

## 2023-05-23 LAB — BLOOD GAS, VENOUS
Acid-base deficit: 5.7 mmol/L — ABNORMAL HIGH (ref 0.0–2.0)
Bicarbonate: 20.7 mmol/L (ref 20.0–28.0)
O2 Saturation: 72.4 %
Patient temperature: 37
pCO2, Ven: 43 mm[Hg] — ABNORMAL LOW (ref 44–60)
pH, Ven: 7.29 (ref 7.25–7.43)
pO2, Ven: 43 mm[Hg] (ref 32–45)

## 2023-05-23 LAB — GLUCOSE, CAPILLARY
Glucose-Capillary: 375 mg/dL — ABNORMAL HIGH (ref 70–99)
Glucose-Capillary: 358 mg/dL — ABNORMAL HIGH (ref 70–99)
Glucose-Capillary: 330 mg/dL — ABNORMAL HIGH (ref 70–99)
Glucose-Capillary: 353 mg/dL — ABNORMAL HIGH (ref 70–99)
Glucose-Capillary: 247 mg/dL — ABNORMAL HIGH (ref 70–99)
Glucose-Capillary: 245 mg/dL — ABNORMAL HIGH (ref 70–99)
Glucose-Capillary: 250 mg/dL — ABNORMAL HIGH (ref 70–99)
Glucose-Capillary: 193 mg/dL — ABNORMAL HIGH (ref 70–99)
Glucose-Capillary: 173 mg/dL — ABNORMAL HIGH (ref 70–99)
Glucose-Capillary: 143 mg/dL — ABNORMAL HIGH (ref 70–99)
Glucose-Capillary: 132 mg/dL — ABNORMAL HIGH (ref 70–99)
Glucose-Capillary: 124 mg/dL — ABNORMAL HIGH (ref 70–99)

## 2023-05-23 LAB — HCG, SERUM, QUALITATIVE: Preg, Serum: NEGATIVE

## 2023-05-23 LAB — BETA-HYDROXYBUTYRIC ACID
Beta-Hydroxybutyric Acid: 4.8 mmol/L — ABNORMAL HIGH (ref 0.05–0.27)
Beta-Hydroxybutyric Acid: 4.9 mmol/L — ABNORMAL HIGH (ref 0.05–0.27)
Beta-Hydroxybutyric Acid: 4.61 mmol/L — ABNORMAL HIGH (ref 0.05–0.27)
Beta-Hydroxybutyric Acid: 0.43 mmol/L — ABNORMAL HIGH (ref 0.05–0.27)

## 2023-05-23 LAB — TROPONIN I (HIGH SENSITIVITY)
Troponin I (High Sensitivity): 25 ng/L — ABNORMAL HIGH (ref <18)
Troponin I (High Sensitivity): 26 ng/L — ABNORMAL HIGH (ref <18)

## 2023-05-23 LAB — LIPASE, BLOOD: Lipase: 19 U/L (ref 11–51)

## 2023-05-23 LAB — HIV ANTIBODY (ROUTINE TESTING W REFLEX): HIV Screen 4th Generation wRfx: NONREACTIVE

## 2023-05-23 MED ORDER — MORPHINE SULFATE (PF) 4 MG/ML IV SOLN
4.0000 mg | Freq: Once | INTRAVENOUS | Status: AC
  Administered 2023-05-23: 4 mg via INTRAVENOUS
  Filled 2023-05-23: qty 1

## 2023-05-23 MED ORDER — HYDROMORPHONE HCL 1 MG/ML IJ SOLN
0.5000 mg | INTRAMUSCULAR | Status: DC | PRN
  Administered 2023-05-23: 0.5 mg via INTRAVENOUS
  Filled 2023-05-23: qty 1

## 2023-05-23 MED ORDER — FAMOTIDINE IN NACL 20-0.9 MG/50ML-% IV SOLN
20.0000 mg | Freq: Once | INTRAVENOUS | Status: AC
  Administered 2023-05-23: 20 mg via INTRAVENOUS
  Filled 2023-05-23: qty 50

## 2023-05-23 MED ORDER — SENNOSIDES-DOCUSATE SODIUM 8.6-50 MG PO TABS
1.0000 | ORAL_TABLET | Freq: Every day | ORAL | Status: DC
  Filled 2023-05-23 (×2): qty 1

## 2023-05-23 MED ORDER — SODIUM CHLORIDE 0.9 % IV BOLUS
1000.0000 mL | Freq: Once | INTRAVENOUS | Status: AC
  Administered 2023-05-23: 1000 mL via INTRAVENOUS

## 2023-05-23 MED ORDER — PROCHLORPERAZINE EDISYLATE 10 MG/2ML IJ SOLN
10.0000 mg | Freq: Once | INTRAMUSCULAR | Status: AC
  Administered 2023-05-23: 10 mg via INTRAVENOUS
  Filled 2023-05-23: qty 2

## 2023-05-23 MED ORDER — HALOPERIDOL LACTATE 5 MG/ML IJ SOLN
2.0000 mg | Freq: Once | INTRAMUSCULAR | Status: AC
  Administered 2023-05-23: 2 mg via INTRAVENOUS
  Filled 2023-05-23: qty 1

## 2023-05-23 MED ORDER — INSULIN ASPART 100 UNIT/ML IJ SOLN: 0.0000 [IU] | INTRAMUSCULAR | Status: DC

## 2023-05-23 MED ORDER — POTASSIUM CHLORIDE 10 MEQ/100ML IV SOLN
10.0000 meq | INTRAVENOUS | Status: DC
  Administered 2023-05-23: 10 meq via INTRAVENOUS
  Filled 2023-05-23 (×2): qty 100

## 2023-05-23 MED ORDER — POLYETHYLENE GLYCOL 3350 17 G PO PACK
17.0000 g | PACK | Freq: Every day | ORAL | Status: DC
  Administered 2023-05-23 – 2023-05-24 (×2): 17 g via ORAL
  Filled 2023-05-23 (×2): qty 1

## 2023-05-23 MED ORDER — ENOXAPARIN SODIUM 40 MG/0.4ML IJ SOSY
40.0000 mg | PREFILLED_SYRINGE | Freq: Every day | INTRAMUSCULAR | Status: DC
  Administered 2023-05-23 – 2023-05-26 (×4): 40 mg via SUBCUTANEOUS
  Filled 2023-05-23 (×4): qty 0.4

## 2023-05-23 MED ORDER — ONDANSETRON HCL 4 MG/2ML IJ SOLN
4.0000 mg | Freq: Four times a day (QID) | INTRAMUSCULAR | Status: DC | PRN
  Administered 2023-05-23 – 2023-05-24 (×3): 4 mg via INTRAVENOUS
  Filled 2023-05-23 (×3): qty 2

## 2023-05-23 MED ORDER — POTASSIUM CHLORIDE 10 MEQ/100ML IV SOLN
10.0000 meq | INTRAVENOUS | Status: AC
Start: 2023-05-23 — End: 2023-05-23
  Administered 2023-05-23 (×3): 10 meq via INTRAVENOUS
  Filled 2023-05-23 (×3): qty 100

## 2023-05-23 MED ORDER — HYDROMORPHONE HCL 1 MG/ML IJ SOLN
0.5000 mg | Freq: Four times a day (QID) | INTRAMUSCULAR | Status: DC | PRN
  Administered 2023-05-23: 0.5 mg via INTRAVENOUS
  Filled 2023-05-23: qty 1

## 2023-05-23 MED ORDER — INSULIN GLARGINE-YFGN 100 UNIT/ML ~~LOC~~ SOLN
10.0000 [IU] | Freq: Every day | SUBCUTANEOUS | Status: DC
  Administered 2023-05-24 (×2): 10 [IU] via SUBCUTANEOUS
  Filled 2023-05-23 (×3): qty 0.1

## 2023-05-23 MED ORDER — DEXTROSE 50 % IV SOLN: 0.0000 mL | INTRAVENOUS | Status: DC | PRN

## 2023-05-23 MED ORDER — DEXTROSE IN LACTATED RINGERS 5 % IV SOLN: INTRAVENOUS | Status: AC

## 2023-05-23 MED ORDER — HYDROMORPHONE HCL 1 MG/ML IJ SOLN
1.0000 mg | Freq: Once | INTRAMUSCULAR | Status: AC
  Administered 2023-05-23: 1 mg via INTRAVENOUS
  Filled 2023-05-23: qty 1

## 2023-05-23 MED ORDER — INSULIN ASPART 100 UNIT/ML IJ SOLN
0.0000 [IU] | Freq: Three times a day (TID) | INTRAMUSCULAR | Status: DC
  Administered 2023-05-24: 3 [IU] via SUBCUTANEOUS
  Administered 2023-05-24: 4 [IU] via SUBCUTANEOUS
  Administered 2023-05-24: 3 [IU] via SUBCUTANEOUS
  Administered 2023-05-25: 11 [IU] via SUBCUTANEOUS
  Administered 2023-05-25: 20 [IU] via SUBCUTANEOUS
  Administered 2023-05-26 (×2): 11 [IU] via SUBCUTANEOUS

## 2023-05-23 MED ORDER — LACTATED RINGERS IV BOLUS
1000.0000 mL | Freq: Once | INTRAVENOUS | Status: AC
  Administered 2023-05-23: 1000 mL via INTRAVENOUS

## 2023-05-23 MED ORDER — IOHEXOL 350 MG/ML SOLN
75.0000 mL | Freq: Once | INTRAVENOUS | Status: AC | PRN
  Administered 2023-05-23: 75 mL via INTRAVENOUS

## 2023-05-23 MED ORDER — LACTATED RINGERS IV SOLN: INTRAVENOUS | Status: AC

## 2023-05-23 MED ORDER — KETOROLAC TROMETHAMINE 15 MG/ML IJ SOLN
15.0000 mg | Freq: Once | INTRAMUSCULAR | Status: AC
  Administered 2023-05-23: 15 mg via INTRAVENOUS
  Filled 2023-05-23: qty 1

## 2023-05-23 MED ORDER — INSULIN REGULAR(HUMAN) IN NACL 100-0.9 UT/100ML-% IV SOLN
INTRAVENOUS | Status: AC
  Administered 2023-05-23: 8.5 [IU]/h via INTRAVENOUS
  Filled 2023-05-23: qty 100

## 2023-05-23 MED ORDER — DIPHENHYDRAMINE HCL 50 MG/ML IJ SOLN
25.0000 mg | Freq: Once | INTRAMUSCULAR | Status: AC
  Administered 2023-05-23: 25 mg via INTRAVENOUS
  Filled 2023-05-23: qty 1

## 2023-05-23 NOTE — ED Provider Notes (Signed)
[facility]PT [facility] Emergency Department Provider Note MRN:  [id number]  Arrival date & time: 05/23/23     Chief Complaint   Abdominal Pain   History of Present Illness   [name] is a 57 y.o. year-old female with a history of CAD, gastroparesis presenting to the ED with chief complaint of abdominal pain.  Abdominal pain and severe nausea consistent with prior episodes of gastroparesis.  Review of Systems  I was unable to obtain a full/accurate HPI, PMH, or ROS due to the patient's nausea vomiting.  Patient's Health History    Past Medical History:  Diagnosis Date   AKI (acute kidney injury) (HCC)    Anemia, iron deficiency    Anxiety and depression 05/18/2015   CAD (coronary artery disease), native coronary artery 01/11/2018   s/p CABG - Non-STEMI with severe three-vessel disease noted July 2019   Depression    Diabetic gastroparesis (HCC)    Diabetic neuropathy, type I diabetes mellitus (HCC) 05/18/2015   DKA, type 1 (HCC) 03/18/2016   Essential hypertension    Gastroparesis    GERD (gastroesophageal reflux disease)    HLD (hyperlipidemia)    MDD (major depressive disorder), single episode, severe , no psychosis (HCC)    Tardive dyskinesia    Vitamin B12 deficiency 08/16/2015    Past Surgical History:  Procedure Laterality Date   CARDIAC SURGERY     COLONOSCOPY  09/27/2014   at [facility]   CORONARY ARTERY BYPASS GRAFT N/A 01/14/2018   Procedure: CORONARY ARTERY BYPASS GRAFTING (CABG)X3, RIGHT AND LEFT SAPHENOUS VEIN HARVEST, MAMMARY TAKE DOWN. MAMMARY TO LAD, SVG TO PD, SVG TO DISTAL CIRC.;  Surgeon: Delight Ovens, MD;  Location: [facility] OR;  Service: Open Heart Surgery;  Laterality: N/A;   ESOPHAGOGASTRODUODENOSCOPY  09/27/2014   at [facility], Dr Vashti Hey. biospy neg for celiac, neg for H pylori.    EYE SURGERY     gailstones     INTRAMEDULLARY (IM) NAIL INTERTROCHANTERIC Right 05/10/2019   Procedure: INTRAMEDULLARY (IM) NAIL INTERTROCHANTRIC;   Surgeon: Lyndle Herrlich, MD;  Location: [facility] ORS;  Service: Orthopedics;  Laterality: Right;   IR FLUORO GUIDE CV LINE RIGHT  02/01/2017   IR FLUORO GUIDE CV LINE RIGHT  03/06/2017   IR FLUORO GUIDE CV LINE RIGHT  03/25/2017   IR GENERIC HISTORICAL  01/24/2016   IR FLUORO GUIDE CV LINE RIGHT 01/24/2016 Darrell K Allred, PA-C [facility]INTERV [facility]   IR GENERIC HISTORICAL  01/24/2016   IR US GUIDE VASC ACCESS RIGHT 01/24/2016 Darrell K Allred, PA-C [facility]   IR US GUIDE VASC ACCESS RIGHT  02/01/2017   IR US GUIDE VASC ACCESS RIGHT  03/06/2017   IR US GUIDE VASC ACCESS RIGHT  03/25/2017   IR US GUIDE VASC ACCESS RIGHT  01/20/2019   IR VENIPUNCTURE 3YRS/OLDER BY MD  01/20/2019   LEFT HEART CATH AND CORONARY ANGIOGRAPHY N/A 01/07/2018   Procedure: LEFT HEART CATH AND CORONARY ANGIOGRAPHY;  Surgeon: Lennette Bihari, MD;  Location: [facility] INVAS[facility] CV [facility];  Service: Cardiovascular;  Laterality: N/A;   POSTERIOR VITRECTOMY AND MEMBRANE PEEL-LEFT EYE  09/28/2002   POSTERIOR VITRECTOMY AND MEMBRANE PEEL-RIGHT EYE  03/16/2002   RETINAL DETACHMENT SURGERY     TEE WITHOUT CARDIOVERSION N/A 01/14/2018   Procedure: TRANSESOPHAGEAL ECHOCARDIOGRAM (TEE);  Surgeon: Delight Ovens, MD;  Location: [facility] OR;  Service: Open Heart Surgery;  Laterality: N/A;    Family History  Problem Relation Age of Onset   Cystic fibrosis Mother  Hypertension Father    Diabetes Brother    Hypertension Maternal Grandmother     Social History   Socioeconomic History   Marital status: Single    Spouse name: Not on file   Number of children: 1   Years of education: Not on file   Highest education level: Not on file  Occupational History   Occupation: Unemployed  Tobacco Use   Smoking status: Never   Smokeless tobacco: Never  Vaping Use   Vaping status: Never Used  Substance and Sexual Activity   Alcohol use: No   Drug use: Yes    Types: Marijuana    Comment: occ   Sexual activity: Yes    Partners: Male    Birth control/protection:  None  Other Topics Concern   Not on file  Social History Narrative   Not on file   Social Determinants of Health   Financial Resource Strain: Low Risk  (12/26/2022)   Overall Financial Resource Strain (CARDIA)    Difficulty of Paying Living Expenses: Not hard at all  Food Insecurity: No Food Insecurity (12/26/2022)   Hunger Vital Sign    Worried About Running Out of Food in the Last Year: Never true    Ran Out of Food in the Last Year: Never true  Transportation Needs: No Transportation Needs (12/26/2022)   PRAPARE - Administrator, Civil Service (Medical): No    Lack of Transportation (Non-Medical): No  Physical Activity: Inactive (12/26/2022)   Exercise Vital Sign    Days of Exercise per Week: 0 days    Minutes of Exercise per Session: 0 min  Stress: No Stress Concern Present (12/26/2022)   [employer] of Occupational Health - Occupational Stress Questionnaire    Feeling of Stress : Only a little  Social Connections: Moderately Isolated (12/26/2022)   Social Connection and Isolation Panel [NHANES]    Frequency of Communication with Friends and Family: More than three times a week    Frequency of Social Gatherings with Friends and Family: Three times a week    Attends Religious Services: 1 to 4 times per year    Active Member of Clubs or Organizations: No    Attends Banker Meetings: Never    Marital Status: Never married  Intimate Partner Violence: Not At Risk (12/26/2022)   Humiliation, Afraid, Rape, and Kick questionnaire    Fear of Current or Ex-Partner: No    Emotionally Abused: No    Physically Abused: No    Sexually Abused: No     Physical Exam   Vitals:   05/23/23 0530 05/23/23 0550  BP: (!) 141/56   Pulse: 86   Resp: 20   Temp:  98.1 F (36.7 C)  SpO2: 93%     CONSTITUTIONAL: Ill-appearing, actively vomiting NEURO/PSYCH:  Alert and oriented x 3, no focal deficits EYES:  eyes equal and reactive ENT/NECK:  no LAD, no JVD CARDIO: Regular  rate, well-perfused, normal S1 and S2 PULM:  CTAB no wheezing or rhonchi GI/GU:  non-distended, non-tender MSK/SPINE:  No gross deformities, no edema SKIN:  no rash, atraumatic   *Additional and/or pertinent findings included in MDM below  Diagnostic and Interventional Summary    EKG Interpretation Date/Time:  Thursday May 23 2023 00:22:02 EST Ventricular Rate:  101 PR Interval:  136 QRS Duration:  87 QT Interval:  314 QTC Calculation: 407 R Axis:   -38  Text Interpretation: Sinus tachycardia Left axis deviation Anteroseptal infarct, old Nonspecific repol abnormality, diffuse  leads Confirmed by Kennis Carina [phone]) on 05/23/2023 2:01:45 AM       Labs Reviewed  COMPREHENSIVE METABOLIC PANEL - Abnormal; Notable for the following components:      Result Value   Potassium 3.3 (*)    CO2 17 (*)    Glucose, Bld 412 (*)    BUN 22 (*)    Creatinine, Ser 1.73 (*)    Total Protein 8.5 (*)    Total Bilirubin 1.7 (*)    GFR, Estimated 34 (*)    Anion gap 18 (*)    All other components within normal limits  TROPONIN I (HIGH SENSITIVITY) - Abnormal; Notable for the following components:   Troponin I (High Sensitivity) 25 (*)    All other components within normal limits  TROPONIN I (HIGH SENSITIVITY) - Abnormal; Notable for the following components:   Troponin I (High Sensitivity) 26 (*)    All other components within normal limits  LIPASE, BLOOD  CBC  HCG, SERUM, QUALITATIVE  URINALYSIS, ROUTINE W REFLEX MICROSCOPIC  BETA-HYDROXYBUTYRIC ACID  BLOOD GAS, VENOUS    CT ABDOMEN PELVIS W CONTRAST  Final Result      Medications  haloperidol lactate (HALDOL) injection 2 mg (2 mg Intravenous Given 05/23/23 0204)  ketorolac (TORADOL) 15 MG/ML injection 15 mg (15 mg Intravenous Given 05/23/23 0211)  sodium chloride 0.9 % bolus 1,000 mL (0 mLs Intravenous Stopped 05/23/23 0319)  HYDROmorphone (DILAUDID) injection 1 mg (1 mg Intravenous Given 05/23/23 0317)  prochlorperazine  (COMPAZINE) injection 10 mg (10 mg Intravenous Given 05/23/23 0610)  diphenhydrAMINE (BENADRYL) injection 25 mg (25 mg Intravenous Given 05/23/23 0607)  famotidine (PEPCID) IVPB 20 mg premix (0 mg Intravenous Stopped 05/23/23 0709)  morphine (PF) 4 MG/ML injection 4 mg (4 mg Intravenous Given 05/23/23 0609)  iohexol (OMNIPAQUE) 350 MG/ML injection 75 mL (75 mLs Intravenous Contrast Given 05/23/23 6045)     Procedures  /  Critical Care .Critical Care  Performed by: Sabas Sous, MD Authorized by: Sabas Sous, MD   Critical care provider statement:    Critical care time (minutes):  32   Critical care was necessary to treat or prevent imminent or life-threatening deterioration of the following conditions:  Metabolic crisis   Critical care was time spent personally by me on the following activities:  Development of treatment plan with patient or surrogate, discussions with consultants, evaluation of patient's response to treatment, examination of patient, ordering and review of laboratory studies, ordering and review of radiographic studies, ordering and performing treatments and interventions, pulse oximetry, re-evaluation of patient's condition and review of old charts   ED Course and Medical Decision Making  Initial Impression and Ddx Patient appears as if she feels very poorly however is well-perfused, she is actively vomiting, abdomen is soft, providing symptomatic management and will reassess.  Considering gastroparesis, atypical presentation of ACS, will hold off on advanced imaging until we see how she responds to initial treatment.  Past medical/surgical history that increases complexity of ED encounter: Type 1 diabetes  Interpretation of Diagnostics I personally reviewed the laboratory assessment and my interpretation is as follows: Hyperglycemia, mild acidosis, elevated anion gap, question DKA versus dehydration with hyperglycemia  CT with nonspecific colitis  Patient  Reassessment and Ultimate Disposition/Management     Plan is for hospitalist admission.  Patient management required discussion with the following services or consulting groups:  Hospitalist Service  Complexity of Problems Addressed Acute illness or injury that poses threat of life of bodily function  Additional Data Reviewed and Analyzed Further history obtained from: Prior labs/imaging results  Additional Factors Impacting ED Encounter Risk Consideration of hospitalization  Elmer Sow. Pilar Plate, MD [facility] Emergency Medicine [facility] [email or web address]  Final Clinical Impressions(s) / ED Diagnoses     ICD-10-CM   1. Abdominal pain, unspecified abdominal location  R10.9       ED Discharge Orders     None        Discharge Instructions Discussed with and Provided to Patient:   Discharge Instructions   None      Sabas Sous, MD 05/23/23 [phone]

## 2023-05-23 NOTE — Plan of Care (Signed)
  Problem: Education: Goal: Knowledge of General Education information will improve Description: Including pain rating scale, medication(s)/side effects and non-pharmacologic comfort measures Outcome: Progressing   Problem: Health Behavior/Discharge Planning: Goal: Ability to manage health-related needs will improve Outcome: Progressing   Problem: Clinical Measurements: Goal: Ability to maintain clinical measurements within normal limits will improve Outcome: Progressing Goal: Will remain free from infection Outcome: Progressing Goal: Diagnostic test results will improve Outcome: Progressing Goal: Respiratory complications will improve Outcome: Progressing Goal: Cardiovascular complication will be avoided Outcome: Progressing   Problem: Activity: Goal: Risk for activity intolerance will decrease Outcome: Progressing   Problem: Nutrition: Goal: Adequate nutrition will be maintained Outcome: Progressing   Problem: Coping: Goal: Level of anxiety will decrease Outcome: Progressing   Problem: Elimination: Goal: Will not experience complications related to bowel motility Outcome: Progressing Goal: Will not experience complications related to urinary retention Outcome: Progressing   Problem: Pain Management: Goal: General experience of comfort will improve Outcome: Progressing   Problem: Safety: Goal: Ability to remain free from injury will improve Outcome: Progressing   Problem: Skin Integrity: Goal: Risk for impaired skin integrity will decrease Outcome: Progressing   Problem: Education: Goal: Ability to describe self-care measures that may prevent or decrease complications (Diabetes Survival Skills Education) will improve Outcome: Progressing Goal: Individualized Educational Video(s) Outcome: Progressing   Problem: Coping: Goal: Ability to adjust to condition or change in health will improve Outcome: Progressing   Problem: Fluid Volume: Goal: Ability to  maintain a balanced intake and output will improve Outcome: Progressing   Problem: Health Behavior/Discharge Planning: Goal: Ability to identify and utilize available resources and services will improve Outcome: Progressing Goal: Ability to manage health-related needs will improve Outcome: Progressing   Problem: Metabolic: Goal: Ability to maintain appropriate glucose levels will improve Outcome: Progressing   Problem: Nutritional: Goal: Maintenance of adequate nutrition will improve Outcome: Progressing Goal: Progress toward achieving an optimal weight will improve Outcome: Progressing   Problem: Skin Integrity: Goal: Risk for impaired skin integrity will decrease Outcome: Progressing   Problem: Tissue Perfusion: Goal: Adequacy of tissue perfusion will improve Outcome: Progressing   Problem: Education: Goal: Ability to describe self-care measures that may prevent or decrease complications (Diabetes Survival Skills Education) will improve Outcome: Progressing Goal: Individualized Educational Video(s) Outcome: Progressing   Problem: Cardiac: Goal: Ability to maintain an adequate cardiac output will improve Outcome: Progressing   Problem: Health Behavior/Discharge Planning: Goal: Ability to identify and utilize available resources and services will improve Outcome: Progressing Goal: Ability to manage health-related needs will improve Outcome: Progressing   Problem: Fluid Volume: Goal: Ability to achieve a balanced intake and output will improve Outcome: Progressing   Problem: Metabolic: Goal: Ability to maintain appropriate glucose levels will improve Outcome: Progressing   Problem: Nutritional: Goal: Maintenance of adequate nutrition will improve Outcome: Progressing Goal: Maintenance of adequate weight for body size and type will improve Outcome: Progressing   Problem: Respiratory: Goal: Will regain and/or maintain adequate ventilation Outcome: Progressing    Problem: Urinary Elimination: Goal: Ability to achieve and maintain adequate renal perfusion and functioning will improve Outcome: Progressing

## 2023-05-23 NOTE — H&P (Addendum)
Date: 05/23/2023               Patient Name:  [name] MRN: [id number]  DOB: [birth year] Age / Sex: 58 y.o., female   PCP: Hoy Register, MD         Medical Service: Internal Medicine Teaching Service         Attending Physician: Dr. Inez Catalina, MD      First Contact: Dr. Jeral Pinch, DO Pager [phone]    Second Contact: Dr. Modena Slater, DO Pager [phone]         After Hours (After 5p/  First Contact Pager: [phone]  weekends / holidays): Second Contact Pager: [phone]   SUBJECTIVE   Chief Complaint: vomiting, Abdominal pain  History of Present Illness:  [name] is a 57 year old past medical history of uncontrolled type 1 diabetes, CAD s/p CABG x3 (LIMA to LAD, SVG to PDA, SVG to OM) in 12/2017, R femoral fracture (s/p intramedullary nail in 04/2019, history of gastroparesis, CKD 3 A, history of recurrent UTIs, HFpEF who presents with abdominal pain and elevated blood sugars.  She was seen at endocrinology on November 25.  Insulin regimen with Lantus 9 units twice daily and sliding scale NovoLog.  Uses Dexcom G6, previously on Omnipod but discontinued due to cost.  She endorses taking insulin over the last couple of days, however she is very sleepy after receiving Benadryl, Haldol, and morphine in ED.  She denies chest pain and dysuria.  She continues to feel nauseated.  She reports that she started having abdominal pain on Tuesday evening with associated vomiting.  It is described as 8 out of 10, dull generalized abdominal pain.  She is constipated, but did have a bowel movement yesterday.  She has had difficulty keeping down fluids in the last day.  She reports adherence with insulin, but noted increased blood sugars yesterday.  ED Course: 150-170/50-60s BP  Glucose 412 Bicar 17 with AG 18 Trop 25-> 26 no ST elevations or depressions on EKG  Pregnancy test negative K 3.3  Given haldol, benadryl, dilaudid, toradol, morphine, compazine.  CT abdomen showed wall  thickening in distal ascending colon  IMTS called for refractory abdominal pain  Past Medical History Type 2 diabetes, uncontrolled Gastroparesis CAD status post CABG HFpEF History of femur fracture  Past Surgical History:  Procedure Laterality Date   CARDIAC SURGERY     COLONOSCOPY  09/27/2014   at [facility]   CORONARY ARTERY BYPASS GRAFT N/A 01/14/2018   Procedure: CORONARY ARTERY BYPASS GRAFTING (CABG)X3, RIGHT AND LEFT SAPHENOUS VEIN HARVEST, MAMMARY TAKE DOWN. MAMMARY TO LAD, SVG TO PD, SVG TO DISTAL CIRC.;  Surgeon: Delight Ovens, MD;  Location: [facility] OR;  Service: Open Heart Surgery;  Laterality: N/A;   ESOPHAGOGASTRODUODENOSCOPY  09/27/2014   at [facility], Dr Vashti Hey. biospy neg for celiac, neg for H pylori.    EYE SURGERY     gailstones     INTRAMEDULLARY (IM) NAIL INTERTROCHANTERIC Right 05/10/2019   Procedure: INTRAMEDULLARY (IM) NAIL INTERTROCHANTRIC;  Surgeon: Lyndle Herrlich, MD;  Location: [facility] ORS;  Service: Orthopedics;  Laterality: Right;   IR FLUORO GUIDE CV LINE RIGHT  02/01/2017   IR FLUORO GUIDE CV LINE RIGHT  03/06/2017   IR FLUORO GUIDE CV LINE RIGHT  03/25/2017   IR GENERIC HISTORICAL  01/24/2016   IR FLUORO GUIDE CV LINE RIGHT 01/24/2016 Darrell K Allred, PA-C [facility]   IR GENERIC HISTORICAL  01/24/2016   IR US GUIDE VASC ACCESS  RIGHT 01/24/2016 Laqueta Due, PA-C [facility]   IR US GUIDE VASC ACCESS RIGHT  02/01/2017   IR US GUIDE VASC ACCESS RIGHT  03/06/2017   IR US GUIDE VASC ACCESS RIGHT  03/25/2017   IR US GUIDE VASC ACCESS RIGHT  01/20/2019   IR VENIPUNCTURE 41YRS/OLDER BY MD  01/20/2019   LEFT HEART CATH AND CORONARY ANGIOGRAPHY N/A 01/07/2018   Procedure: LEFT HEART CATH AND CORONARY ANGIOGRAPHY;  Surgeon: Lennette Bihari, MD;  Location: [facility] INVAS[facility] CV [facility];  Service: Cardiovascular;  Laterality: N/A;   POSTERIOR VITRECTOMY AND MEMBRANE PEEL-LEFT EYE  09/28/2002   POSTERIOR VITRECTOMY AND MEMBRANE PEEL-RIGHT EYE  03/16/2002   RETINAL DETACHMENT SURGERY      TEE WITHOUT CARDIOVERSION N/A 01/14/2018   Procedure: TRANSESOPHAGEAL ECHOCARDIOGRAM (TEE);  Surgeon: Delight Ovens, MD;  Location: [facility] OR;  Service: Open Heart Surgery;  Laterality: N/A;    Meds:  Patient did not seem aware of her medications, would readdress this tomorrow when she is feeling better  Lantus 9 units twice daily Sliding scale NovoLog Dexcom Aspirin 81 mg Lasix 40 mg, she states she takes as needed Losartan 25 mg Clopamide 10 mg 3 times daily Metoprolol 12.5 mg BID Lyrica 150 mg twice daily Rosuvastatin 20 mg daily Tramadol 50 mg twice daily as needed  Social:  Lives With: Alone Occupation: No answer given Support: Daughter and sisters live in [place] Level of Function: Independent in ADLs PCP: Dr. Alvis Lemmings Substances: Patient is sleepy on exam, unable to assess substance use   Allergies: Allergies as of 05/23/2023 - Review Complete 05/23/2023  Allergen Reaction Noted   Anesthetics, amide Nausea And Vomiting 03/21/2012   Chlorhexidine Other (See Comments) 05/10/2019   Penicillins Diarrhea, Nausea And Vomiting, and Other (See Comments) 10/20/2014   Enkaid [encainide] Nausea And Vomiting 02/08/2015   Subutex [buprenorphine] Rash     Review of Systems: A complete ROS was negative except as per HPI.   OBJECTIVE:   Physical Exam: Blood pressure (!) 141/56, pulse 86, temperature 98.1 F (36.7 C), resp. rate 20, SpO2 93%.  Constitutional: sleepy, awakens easily, in no acute distress HENT: Dry mucous membranes Cardiovascular: regular rate and rhythm, no m/r/g Pulmonary/Chest: Mildly tachypneic, normal work of breathing on room air, no wheezing appreciated Abdominal: Bowel sounds appreciated in all 4 quadrants, soft, tenderness present to all 4 quadrants, no guarding or rebound MSK: normal bulk and tone Neurological: alert & oriented to self and location, unsure of time Skin: warm and dry  Labs: CBC    Component Value Date/Time   WBC 9.5  05/23/2023 0024   RBC 4.92 05/23/2023 0024   HGB 13.7 05/23/2023 0024   HGB 11.4 11/05/2018 1419   HCT 42.1 05/23/2023 0024   HCT 33.5 (L) 11/05/2018 1419   PLT 302 05/23/2023 0024   PLT 292 11/05/2018 1419   MCV 85.6 05/23/2023 0024   MCV 89 11/05/2018 1419   MCH 27.8 05/23/2023 0024   MCHC 32.5 05/23/2023 0024   RDW 13.2 05/23/2023 0024   RDW 12.7 11/05/2018 1419   LYMPHSABS 1.5 01/08/2022 1218   MONOABS 0.9 01/08/2022 1218   EOSABS 0.1 01/08/2022 1218   BASOSABS 0.1 01/08/2022 1218     CMP     Component Value Date/Time   NA 140 05/23/2023 0024   NA 139 05/06/2023 1233   K 3.3 (L) 05/23/2023 0024   CL 105 05/23/2023 0024   CO2 17 (L) 05/23/2023 0024   GLUCOSE 412 (H) 05/23/2023 0024  BUN 22 (H) 05/23/2023 0024   BUN 18 05/06/2023 1233   CREATININE 1.73 (H) 05/23/2023 0024   CREATININE 1.11 (H) 09/04/2016 1459   CALCIUM 9.5 05/23/2023 0024   PROT 8.5 (H) 05/23/2023 0024   PROT 7.2 05/06/2023 1233   ALBUMIN 3.7 05/23/2023 0024   ALBUMIN 3.7 (L) 05/06/2023 1233   AST 28 05/23/2023 0024   ALT 19 05/23/2023 0024   ALKPHOS 123 05/23/2023 0024   BILITOT 1.7 (H) 05/23/2023 0024   BILITOT 0.4 05/06/2023 1233   GFRNONAA 34 (L) 05/23/2023 0024   GFRNONAA 58 (L) 09/04/2016 1459   GFRAA 54 (L) 05/15/2019 0344   GFRAA 67 09/04/2016 1459   Lipase within normal limits  Imaging: CT abd pelvis IMPRESSION: 1. Wall thickening in the distal ascending colon which could be due to underdistention, colitis or infiltrating disease. There is an abrupt change from the non-thickened wall of the proximal ascending colon to the thickened portion. Follow-up colonoscopy or barium enema recommended. 2. Constipation and diverticulosis. 3. Aortic atherosclerosis. 4. Small hiatal hernia. 5. Chronic calcific pancreatitis. No evidence of acute pancreatitis. 6. Small umbilical fat hernia. 7. 1 cm calcified fundal uterine fibroid.  EKG: Sinus tachycardia with left axis deviation, no ST  elevations or depressions appreciated compared to prior  ASSESSMENT & PLAN:   Assessment & Plan by Problem: Active Problems:   DKA (diabetic ketoacidosis) (HCC)   Clio Courtemanche is a 57 y.o. person living with a history of uncontrolled type 1 diabetes, gastroparesis, CKD 3A, history of retinal detachment who presented with abdominal pain, vomiting, elevated blood sugars and admitted for DKA on hospital day 0 There is no obvious infection, I do not think she is having ischemia of bowels or a heart attack, no neurological deficits concerning for stroke, urine pregnancy test negative, adherence could be a concern with this patient.  She prefers insulin pump but had difficulty with this due to cost.  Endocrinology note indicates that they are working to get her back on her insulin pump.  Diabetic ketoacidosis AGMA NAGMA Hypokalemia Patient presenting with 2 days of abdominal pain, vomiting and hyperglycemia.  Bicarb 17 with AG 18, VBG with pH of 7.29, beta hydroxy elevated at 4.8 consistent with diabetic ketoacidosis.  ED provider did CT scan abdomen that showed possible colitis versus underdistention.  She reports constipation.  In terms of etiology for her DKA, unlikely to be MI as no ST elevations or depressions.  Troponins initially elevated at 25-26 on repeat.  Pregnancy test was negative.  Low suspicion for infection as she does not have a leukocytosis.  Patient is denying dysuria, cough, and diarrhea.  With her history of gastroparesis I question if this could have been because if she was vomiting due to this leading to not taking insulin.  Concomitant NAGMA likely in the setting of vomiting for the last day and a half.  Potassium at 3.3.  Working to replete potassium to be greater than 3.5 prior to starting Endo tool.  -Repeat BMP pending from earlier today.  If potassium greater than 3.5 will start patient on Endo tool.  She is on second run of potassium repletion currently -Will plan to  start Endo tool once K appropriate.  This will include every 4 hours BMP, insulin, potassium repletion as appropriate.  She will need to remain n.p.o. -IV Zofran -Holding Lyrica 150 mg twice daily  Abdominal pain Gastroparesis Patient continues to endorse generalized abdominal pain.  She says at baseline she has some pain due  to gastroparesis.  Lipase was within normal limits.  Her CT scan showed wall thickening in distal ascending colon which could be due to underdistention colitis or infiltrating disease.  She also had significant constipation diverticulosis.  CT showed chronic calcific pancreatitis.  Additionally abdominal pain could be secondary to DKA.  Medications include Zofran and Reglan 3 times daily with meals.  Unclear if she has been taking these medications. -Continue to monitor -IV Dilaudid 0.5 mg every 6 hours as needed -miralax, senna qhs  AKI CKD 3 A Creatinine 1.7.  Baseline appears to be around 1.2-1.5.  Prerenal as she looks dehydrated. -trend BMP -She is receiving fluids  Hyper bili Bili 1.7.  LFTs were within normal limits.  Will repeat CMP tomorrow a.m.  She could have NAFLD.  -trend CMP in AM  History of CAD status post CABG Hypertension HFpEF Home medications appear to include aspirin, Lopressor, Lasix 40 mg as needed, Sartain 25 mg daily, rosuvastatin 20 mg daily. -Holding home medications until patient is less nauseated  Hematuria Significant for large hemoglobin on dipstick.  This pattern is reflected in UAs for the last 4 years.  She denies dysuria. -f/ up outpatient  Diet: NPO VTE: Enoxaparin IVF: LR,Bolus Code: Full  Prior to Admission Living Arrangement: Home, living alone Anticipated Discharge Location: Home Barriers to Discharge: DKA  Dispo: Admit patient to Inpatient with expected length of stay greater than 2 midnights.  Signed: Rudene Christians, DO Internal Medicine Resident PGY-3  05/23/2023, 11:18 AM

## 2023-05-23 NOTE — ED Notes (Signed)
ED TO INPATIENT HANDOFF REPORT  ED Nurse Name and Phone #: =  S Name/Age/Gender Dwain Sarna 57 y.o. female Room/Bed: 003C/003C  Code Status   Code Status: Full Code  Home/SNF/Other Home Patient oriented to: self, place, time, and situation Is this baseline? Yes   Triage Complete: Triage complete  Chief Complaint Hyperglycemia due to type 1 diabetes mellitus (HCC) [E10.65]  Triage Note Pt is coming from home, she has been having nausea, vomiting and abd pain x 1 day, Hx of gastroparesis, and she mentons the feels the same as her last flare up.  Medic Vitals   104hr 5F IR 22rr 99%ra Bgl 411 160/88     Allergies Allergies  Allergen Reactions   Anesthetics, Amide Nausea And Vomiting   Chlorhexidine Other (See Comments)    Unknown reaction   Penicillins Diarrhea, Nausea And Vomiting and Other (See Comments)   Enkaid [Encainide] Nausea And Vomiting   Subutex [Buprenorphine] Rash    Level of Care/Admitting Diagnosis ED Disposition     ED Disposition  Admit   Condition  --   Comment  Hospital Area: [facility] [[id number]]  Level of Care: Progressive [102]  Admit to Progressive based on following criteria: GI, ENDOCRINE disease patients with GI bleeding, acute liver failure or pancreatitis, stable with diabetic ketoacidosis or thyrotoxicosis (hypothyroid) state.  May admit patient to [facility] or [facility] if equivalent level of care is available:: No  Covid Evaluation: Asymptomatic - no recent exposure (last 10 days) testing not required  Diagnosis: Hyperglycemia due to type 1 diabetes mellitus [facility]) [[id number]]  Admitting Physician: Inez Catalina [id number]  Attending Physician: Inez Catalina [phone]  Certification:: I certify this patient will need inpatient services for at least 2 midnights  Expected Medical Readiness: 05/26/2023          B Medical/Surgery History Past Medical History:  Diagnosis Date   AKI (acute kidney injury)  (HCC)    Anemia, iron deficiency    Anxiety and depression 05/18/2015   CAD (coronary artery disease), native coronary artery 01/11/2018   s/p CABG - Non-STEMI with severe three-vessel disease noted July 2019   Depression    Diabetic gastroparesis (HCC)    Diabetic neuropathy, type I diabetes mellitus (HCC) 05/18/2015   DKA, type 1 (HCC) 03/18/2016   Essential hypertension    Gastroparesis    GERD (gastroesophageal reflux disease)    HLD (hyperlipidemia)    MDD (major depressive disorder), single episode, severe , no psychosis (HCC)    Tardive dyskinesia    Vitamin B12 deficiency 08/16/2015   Past Surgical History:  Procedure Laterality Date   CARDIAC SURGERY     COLONOSCOPY  09/27/2014   at [facility]   CORONARY ARTERY BYPASS GRAFT N/A 01/14/2018   Procedure: CORONARY ARTERY BYPASS GRAFTING (CABG)X3, RIGHT AND LEFT SAPHENOUS VEIN HARVEST, MAMMARY TAKE DOWN. MAMMARY TO LAD, SVG TO PD, SVG TO DISTAL CIRC.;  Surgeon: Delight Ovens, MD;  Location: [facility] OR;  Service: Open Heart Surgery;  Laterality: N/A;   ESOPHAGOGASTRODUODENOSCOPY  09/27/2014   at [facility], Dr Vashti Hey. biospy neg for celiac, neg for H pylori.    EYE SURGERY     gailstones     INTRAMEDULLARY (IM) NAIL INTERTROCHANTERIC Right 05/10/2019   Procedure: INTRAMEDULLARY (IM) NAIL INTERTROCHANTRIC;  Surgeon: Lyndle Herrlich, MD;  Location: [facility] ORS;  Service: Orthopedics;  Laterality: Right;   IR FLUORO GUIDE CV LINE RIGHT  02/01/2017   IR FLUORO GUIDE CV LINE RIGHT  03/06/2017   IR FLUORO GUIDE CV LINE RIGHT  03/25/2017   IR GENERIC HISTORICAL  01/24/2016   IR FLUORO GUIDE CV LINE RIGHT 01/24/2016 Darrell K Allred, PA-C [facility]   IR GENERIC HISTORICAL  01/24/2016   IR US GUIDE VASC ACCESS RIGHT 01/24/2016 Darrell K Allred, PA-C [facility]   IR US GUIDE VASC ACCESS RIGHT  02/01/2017   IR US GUIDE VASC ACCESS RIGHT  03/06/2017   IR US GUIDE VASC ACCESS RIGHT  03/25/2017   IR US GUIDE VASC ACCESS RIGHT  01/20/2019   IR  VENIPUNCTURE 29YRS/OLDER BY MD  01/20/2019   LEFT HEART CATH AND CORONARY ANGIOGRAPHY N/A 01/07/2018   Procedure: LEFT HEART CATH AND CORONARY ANGIOGRAPHY;  Surgeon: Lennette Bihari, MD;  Location: [facility] INVASIVE CV LAB;  Service: Cardiovascular;  Laterality: N/A;   POSTERIOR VITRECTOMY AND MEMBRANE PEEL-LEFT EYE  09/28/2002   POSTERIOR VITRECTOMY AND MEMBRANE PEEL-RIGHT EYE  03/16/2002   RETINAL DETACHMENT SURGERY     TEE WITHOUT CARDIOVERSION N/A 01/14/2018   Procedure: TRANSESOPHAGEAL ECHOCARDIOGRAM (TEE);  Surgeon: Delight Ovens, MD;  Location: [facility] OR;  Service: Open Heart Surgery;  Laterality: N/A;     A IV Location/Drains/Wounds Patient Lines/Drains/Airways Status     Active Line/Drains/Airways     Name Placement date Placement time Site Days   Peripheral IV 05/23/23 20 G 1.88" Anterior;Right;Upper Arm 05/23/23  0139  Arm  less than 1   External Urinary Catheter 01/07/22  2215  --  501   Incision - 3 Ports Other (Comment) Right Mid Left 05/10/19  1347  -- 1474   Pressure Injury 10/04/21 Heel Left Stage 3 -  Full thickness tissue loss. Subcutaneous fat may be visible but bone, tendon or muscle are NOT exposed. pink, red, white 10/04/21  1710  -- 596   Pressure Injury 10/04/21 Heel Right Unstageable - Full thickness tissue loss in which the base of the injury is covered by slough (yellow, tan, gray, green or brown) and/or eschar (tan, brown or black) in the wound bed. brown/black 10/04/21  1710  -- 596            Intake/Output Last 24 hours No intake or output data in the 24 hours ending 05/23/23 0809  Labs/Imaging Results for orders placed or performed during the hospital encounter of 05/23/23 (from the past 48 hour(s))  Lipase, blood     Status: None   Collection Time: 05/23/23 12:24 AM  Result Value Ref Range   Lipase 19 11 - 51 U/L    Comment: Performed at [facility], [address], [place], [place] [address]  Comprehensive metabolic panel     Status: Abnormal    Collection Time: 05/23/23 12:24 AM  Result Value Ref Range   Sodium 140 135 - 145 mmol/L   Potassium 3.3 (L) 3.5 - 5.1 mmol/L   Chloride 105 98 - 111 mmol/L   CO2 17 (L) 22 - 32 mmol/L   Glucose, Bld 412 (H) 70 - 99 mg/dL    Comment: Glucose reference range applies only to samples taken after fasting for at least 8 hours.   BUN 22 (H) 6 - 20 mg/dL   Creatinine, Ser 6.04 (H) 0.44 - 1.00 mg/dL   Calcium 9.5 8.9 - 54.0 mg/dL   Total Protein 8.5 (H) 6.5 - 8.1 g/dL   Albumin 3.7 3.5 - 5.0 g/dL   AST 28 15 - 41 U/L   ALT 19 0 - 44 U/L  Alkaline Phosphatase 123 38 - 126 U/L   Total Bilirubin 1.7 (H) <1.2 mg/dL   GFR, Estimated 34 (L) >60 mL/min    Comment: (NOTE) Calculated using the CKD-EPI Creatinine Equation (2021)    Anion gap 18 (H) 5 - 15    Comment: Performed at [facility], [address] [place], [place] [address]  CBC     Status: None   Collection Time: 05/23/23 12:24 AM  Result Value Ref Range   WBC 9.5 4.0 - 10.5 K/uL   RBC 4.92 3.87 - 5.11 MIL/uL   Hemoglobin 13.7 12.0 - 15.0 g/dL   HCT 66.4 40.3 - 47.4 %   MCV 85.6 80.0 - 100.0 fL   MCH 27.8 26.0 - 34.0 pg   MCHC 32.5 30.0 - 36.0 g/dL   RDW 25.9 56.3 - 87.5 %   Platelets 302 150 - 400 K/uL   nRBC 0.0 0.0 - 0.2 %    Comment: Performed at [facility], [address], [place], [place] [address]  hCG, serum, qualitative     Status: None   Collection Time: 05/23/23 12:24 AM  Result Value Ref Range   Preg, Serum NEGATIVE NEGATIVE    Comment:        THE SENSITIVITY OF THIS METHODOLOGY IS >10 mIU/mL. Performed at [facility], [address], [place], [place] [address]   Troponin I (High Sensitivity)     Status: Abnormal   Collection Time: 05/23/23  2:14 AM  Result Value Ref Range   Troponin I (High Sensitivity) 25 (H) <18 ng/L    Comment: (NOTE) Elevated high sensitivity troponin I (hsTnI) values and significant  changes across serial measurements may suggest ACS but many other  chronic and  acute conditions are known to elevate hsTnI results.  Refer to the "Links" section for chest pain algorithms and additional  guidance. Performed at [facility], [address], [place], [place] [address]   Troponin I (High Sensitivity)     Status: Abnormal   Collection Time: 05/23/23  4:54 AM  Result Value Ref Range   Troponin I (High Sensitivity) 26 (H) <18 ng/L    Comment: (NOTE) Elevated high sensitivity troponin I (hsTnI) values and significant  changes across serial measurements may suggest ACS but many other  chronic and acute conditions are known to elevate hsTnI results.  Refer to the "Links" section for chest pain algorithms and additional  guidance. Performed at [facility], [address], [place], [place] [address]    *Note: Due to a large number of results and/or encounters for the requested time period, some results have not been displayed. A complete set of results can be found in Results Review.   CT ABDOMEN PELVIS W CONTRAST  Result Date: 05/23/2023 CLINICAL DATA:  Abdominal pain, nausea, and vomiting. History significant for diabetes and gastroparesis. EXAM: CT ABDOMEN AND PELVIS WITH CONTRAST TECHNIQUE: Multidetector CT imaging of the abdomen and pelvis was performed using the standard protocol following bolus administration of intravenous contrast. RADIATION DOSE REDUCTION: This exam was performed according to the departmental dose-optimization program which includes automated exposure control, adjustment of the mA and/or kV according to patient size and/or use of iterative reconstruction technique. CONTRAST:  75mL OMNIPAQUE IOHEXOL 350 MG/ML SOLN COMPARISON:  CT without contrast 11/26/2018, CT with contrast 05/31/2015 FINDINGS: Lower chest: There is posterior atelectasis in the lower lobes. Mild bronchial thickening. No focal pneumonia in the lung bases. The heart is  slightly enlarged. Partially visible sternotomy and CABG changes. No pericardial effusion.  Small hiatal hernia. Hepatobiliary: No significant focal liver abnormality is seen. Status post cholecystectomy. No biliary dilatation. There is focal periligamentous fat in segment 4 B. Pancreas: There are occasional punctate parenchymal calcifications consistent with chronic calcific pancreatitis. There is no evidence of acute pancreatitis, ductal dilatation or mass. Spleen: No abnormality. Adrenals/Urinary Tract: Adrenal glands are unremarkable. Kidneys are normal, without renal calculi, focal lesion, or hydronephrosis. Bladder is unremarkable. Stomach/Bowel: The stomach is contracted. The small bowel is normal caliber. There is no evidence of appendicitis. There are stones in the appendix. There is mild fecal stasis. The wall of the distal ascending colon is thicker than elsewhere which could be due to underdistention, colitis or infiltrating disease. There is an abrupt change from the non-thickened wall of the proximal ascending colon to the thickened portion on coronal reformatted images 78-82. No other colonic thickening is seen. There is sigmoid diverticulosis. Vascular/Lymphatic: Aortic atherosclerosis. No enlarged abdominal or pelvic lymph nodes. Reproductive: Uterus and bilateral adnexa are unremarkable apart from a 1 cm calcified fundal uterine fibroid. Other: Small umbilical fat hernia. No incarcerated hernia. No free fluid, free hemorrhage or free air. Musculoskeletal: Old right hip nailing. No acute or significant osseous findings. Mild hip DJD. IMPRESSION: 1. Wall thickening in the distal ascending colon which could be due to underdistention, colitis or infiltrating disease. There is an abrupt change from the non-thickened wall of the proximal ascending colon to the thickened portion. Follow-up colonoscopy or barium enema recommended. 2. Constipation and diverticulosis. 3. Aortic atherosclerosis. 4. Small hiatal hernia. 5. Chronic calcific pancreatitis. No evidence of acute pancreatitis. 6. Small  umbilical fat hernia. 7. 1 cm calcified fundal uterine fibroid. Aortic Atherosclerosis (ICD10-I70.0). Electronically Signed   By: Almira Bar M.D.   On: 05/23/2023 07:12    Pending Labs Unresulted Labs (From admission, onward)     Start     Ordered   05/24/23 0500  HIV Antibody (routine testing w rflx)  (HIV Antibody (Routine testing w reflex) panel)  Tomorrow morning,   R        05/23/23 0803   05/24/23 0500  Basic metabolic panel  Tomorrow morning,   R        05/23/23 0803   05/24/23 0500  CBC  Tomorrow morning,   R        05/23/23 0803   05/23/23 0830  Beta-hydroxybutyric acid  Once,   R        05/23/23 0830   05/23/23 0734  Blood gas, venous  ONCE - STAT,   STAT        05/23/23 0733   05/23/23 0020  Urinalysis, Routine w reflex microscopic -Urine, Clean Catch  Once,   URGENT       Question:  Specimen Source  Answer:  Urine, Clean Catch   05/23/23 0020            Vitals/Pain Today's Vitals   05/23/23 0515 05/23/23 0530 05/23/23 0550 05/23/23 0606  BP: (!) 147/60 (!) 141/56    Pulse: 91 86    Resp: 19 20    Temp:   98.1 F (36.7 C)   TempSrc:      SpO2: 94% 93%    PainSc:    8     Isolation Precautions No active isolations  Medications Medications  enoxaparin (LOVENOX) injection 40 mg (has no administration in time range)  potassium chloride 10 mEq in 100 mL IVPB (has  no administration in time range)  haloperidol lactate (HALDOL) injection 2 mg (2 mg Intravenous Given 05/23/23 0204)  ketorolac (TORADOL) 15 MG/ML injection 15 mg (15 mg Intravenous Given 05/23/23 0211)  sodium chloride 0.9 % bolus 1,000 mL (0 mLs Intravenous Stopped 05/23/23 0319)  HYDROmorphone (DILAUDID) injection 1 mg (1 mg Intravenous Given 05/23/23 0317)  prochlorperazine (COMPAZINE) injection 10 mg (10 mg Intravenous Given 05/23/23 0610)  diphenhydrAMINE (BENADRYL) injection 25 mg (25 mg Intravenous Given 05/23/23 0607)  famotidine (PEPCID) IVPB 20 mg premix (0 mg Intravenous Stopped  05/23/23 0709)  morphine (PF) 4 MG/ML injection 4 mg (4 mg Intravenous Given 05/23/23 0609)  iohexol (OMNIPAQUE) 350 MG/ML injection 75 mL (75 mLs Intravenous Contrast Given 05/23/23 4098)    Mobility walks     Focused Assessments    R Recommendations: See Admitting Provider Note  Report given to:   Additional Notes:

## 2023-05-23 NOTE — ED Triage Notes (Signed)
Pt is coming from home, she has been having nausea, vomiting and abd pain x 1 day, Hx of gastroparesis, and she mentons the feels the same as her last flare up.  Medic Vitals   104hr 24F IR 22rr 99%ra Bgl 411 160/88

## 2023-05-24 ENCOUNTER — Other Ambulatory Visit: Payer: Self-pay

## 2023-05-24 DIAGNOSIS — E1122 Type 2 diabetes mellitus with diabetic chronic kidney disease: Secondary | ICD-10-CM | POA: Diagnosis not present

## 2023-05-24 DIAGNOSIS — E876 Hypokalemia: Secondary | ICD-10-CM | POA: Diagnosis not present

## 2023-05-24 DIAGNOSIS — R319 Hematuria, unspecified: Secondary | ICD-10-CM

## 2023-05-24 DIAGNOSIS — R109 Unspecified abdominal pain: Secondary | ICD-10-CM

## 2023-05-24 DIAGNOSIS — E1110 Type 2 diabetes mellitus with ketoacidosis without coma: Secondary | ICD-10-CM | POA: Diagnosis not present

## 2023-05-24 DIAGNOSIS — R110 Nausea: Secondary | ICD-10-CM

## 2023-05-24 DIAGNOSIS — N179 Acute kidney failure, unspecified: Secondary | ICD-10-CM

## 2023-05-24 DIAGNOSIS — N1831 Chronic kidney disease, stage 3a: Secondary | ICD-10-CM

## 2023-05-24 LAB — GLUCOSE, CAPILLARY
Glucose-Capillary: 117 mg/dL — ABNORMAL HIGH (ref 70–99)
Glucose-Capillary: 130 mg/dL — ABNORMAL HIGH (ref 70–99)
Glucose-Capillary: 120 mg/dL — ABNORMAL HIGH (ref 70–99)
Glucose-Capillary: 123 mg/dL — ABNORMAL HIGH (ref 70–99)
Glucose-Capillary: 120 mg/dL — ABNORMAL HIGH (ref 70–99)
Glucose-Capillary: 148 mg/dL — ABNORMAL HIGH (ref 70–99)
Glucose-Capillary: 172 mg/dL — ABNORMAL HIGH (ref 70–99)
Glucose-Capillary: 136 mg/dL — ABNORMAL HIGH (ref 70–99)
Glucose-Capillary: 146 mg/dL — ABNORMAL HIGH (ref 70–99)
Glucose-Capillary: 232 mg/dL — ABNORMAL HIGH (ref 70–99)

## 2023-05-24 LAB — CBC
HCT: 38 % (ref 36.0–46.0)
Hemoglobin: 11.9 g/dL — ABNORMAL LOW (ref 12.0–15.0)
MCH: 27.2 pg (ref 26.0–34.0)
MCHC: 31.3 g/dL (ref 30.0–36.0)
MCV: 87 fL (ref 80.0–100.0)
Platelets: 229 10*3/uL (ref 150–400)
RBC: 4.37 MIL/uL (ref 3.87–5.11)
RDW: 13.6 % (ref 11.5–15.5)
WBC: 8 10*3/uL (ref 4.0–10.5)
nRBC: 0 % (ref 0.0–0.2)

## 2023-05-24 LAB — COMPREHENSIVE METABOLIC PANEL
ALT: 14 U/L (ref 0–44)
AST: 23 U/L (ref 15–41)
Albumin: 2.9 g/dL — ABNORMAL LOW (ref 3.5–5.0)
Alkaline Phosphatase: 83 U/L (ref 38–126)
Anion gap: 10 (ref 5–15)
BUN: 27 mg/dL — ABNORMAL HIGH (ref 6–20)
CO2: 20 mmol/L — ABNORMAL LOW (ref 22–32)
Calcium: 8.7 mg/dL — ABNORMAL LOW (ref 8.9–10.3)
Chloride: 115 mmol/L — ABNORMAL HIGH (ref 98–111)
Creatinine, Ser: 1.58 mg/dL — ABNORMAL HIGH (ref 0.44–1.00)
GFR, Estimated: 38 mL/min — ABNORMAL LOW (ref >60)
Glucose, Bld: 126 mg/dL — ABNORMAL HIGH (ref 70–99)
Potassium: 3.9 mmol/L (ref 3.5–5.1)
Sodium: 145 mmol/L (ref 135–145)
Total Bilirubin: 1.1 mg/dL (ref <1.2)
Total Protein: 6.6 g/dL (ref 6.5–8.1)

## 2023-05-24 LAB — BETA-HYDROXYBUTYRIC ACID: Beta-Hydroxybutyric Acid: 1.26 mmol/L — ABNORMAL HIGH (ref 0.05–0.27)

## 2023-05-24 MED ORDER — PREGABALIN 50 MG PO CAPS
75.0000 mg | ORAL_CAPSULE | Freq: Two times a day (BID) | ORAL | Status: DC
  Administered 2023-05-24 – 2023-05-26 (×4): 75 mg via ORAL
  Filled 2023-05-24 (×4): qty 1

## 2023-05-24 MED ORDER — PREGABALIN 50 MG PO CAPS
75.0000 mg | ORAL_CAPSULE | Freq: Every day | ORAL | Status: DC
  Administered 2023-05-24: 75 mg via ORAL
  Filled 2023-05-24: qty 1

## 2023-05-24 MED ORDER — METOPROLOL TARTRATE 12.5 MG HALF TABLET
12.5000 mg | ORAL_TABLET | Freq: Two times a day (BID) | ORAL | Status: DC
  Administered 2023-05-24 – 2023-05-26 (×5): 12.5 mg via ORAL
  Filled 2023-05-24 (×5): qty 1

## 2023-05-24 MED ORDER — ACETAMINOPHEN 325 MG PO TABS
650.0000 mg | ORAL_TABLET | Freq: Four times a day (QID) | ORAL | Status: DC | PRN
  Administered 2023-05-24 – 2023-05-26 (×4): 650 mg via ORAL
  Filled 2023-05-24 (×4): qty 2

## 2023-05-24 MED ORDER — LOSARTAN POTASSIUM 25 MG PO TABS
25.0000 mg | ORAL_TABLET | Freq: Every day | ORAL | Status: DC
  Administered 2023-05-24 – 2023-05-26 (×3): 25 mg via ORAL
  Filled 2023-05-24 (×3): qty 1

## 2023-05-24 MED ORDER — FUROSEMIDE 40 MG PO TABS: 40.0000 mg | ORAL_TABLET | Freq: Every day | ORAL | Status: DC

## 2023-05-24 MED ORDER — ROSUVASTATIN CALCIUM 20 MG PO TABS
20.0000 mg | ORAL_TABLET | Freq: Every day | ORAL | Status: DC
  Administered 2023-05-24 – 2023-05-26 (×3): 20 mg via ORAL
  Filled 2023-05-24 (×3): qty 1

## 2023-05-24 MED ORDER — FUROSEMIDE 40 MG PO TABS: 40.0000 mg | ORAL_TABLET | Freq: Every day | ORAL | Status: DC | PRN

## 2023-05-24 MED ORDER — ASPIRIN 81 MG PO TBEC
81.0000 mg | DELAYED_RELEASE_TABLET | Freq: Every day | ORAL | Status: DC
  Administered 2023-05-24 – 2023-05-26 (×3): 81 mg via ORAL
  Filled 2023-05-24 (×3): qty 1

## 2023-05-24 MED ORDER — POLYETHYLENE GLYCOL 3350 17 G PO PACK
17.0000 g | PACK | Freq: Two times a day (BID) | ORAL | Status: DC
  Filled 2023-05-24: qty 1

## 2023-05-24 NOTE — Hospital Course (Signed)
  Passing some gas.no  bm yet, she has a hx of constipation always requiring the use of laxative.     #DKA  #AGMA #NAGMA #Hypokalemia Presented with 2 days of abdominal pain, vomiting, hyperglycemia.  In ED,  Bicarb 17 with AG 18, VBG with pH of      7.29, beta hydroxy elevated at 4.8 consistent with DKA. She denied any sick symptoms of cough, congestion or sore throat. She reports adherence with medications, reports she takes 9 units of long acting twice daily with SSI during meals. Reports that blood sugars have been >200-300 this past week. Presented with K 3.3, likely in the setting of vomiting and decrease PO intake. Repleted. She was started on Endo tool initially, has been transitioned off last night with AG closing x 2. She is started on Semglee 10 units at night with sliding scale insulin.  She started on her diet as well.    Plan:  -Semglee 10 units at night, SSI -Encourage p.o. intake -Start Lyrica 75 mg daily, renally dosed -PT eval    #AKI in the setting of CKD Stage IIIA Present with creatinine of 1.7, baseline appears to be around 1.2-1.7, likely prerenal in the setting of dehydration.  She is status post receiving fluids.  Creatinine currently at 1.58, will continue to trend. -Continue to trend creatinine -LR infusion    #Abdominal pain #Nausea #History of gastroparesis Patient continues to endorse generalized abdominal pain.  She says at baseline she has some pain due to gastroparesis.  Lipase was within normal limits.  CT abdomen pelvis showed wall thickening in the distal ascending colon which could be due to under distention/colitis/infiltrative disease.  Also showed constipation and diverticulosis, small hiatal hernia, chronic calcified pancreatitis, small umbilical fat hernia.  With her history of gastroparesis and chronic conditions, abdominal pain is multifactorial in this setting, can be likely due to DKA.  Home medications include Zofran and Reglan 3 times  daily with meals.  Unclear if she has been taking these medications.  Per my evaluation today, she continues to endorse generalized abdominal pain, decreased p.o. intake, nausea.  She reports that she has not had a bowel movement since Tuesday, will schedule MiraLAX twice daily to help with constipation. -IV Zofran 4 mg every 6 hours as needed for nausea/vomiting -MiraLAX and senna bowel regimen to help with constipation -IV Dilaudid 0.5 mg every 6 hours as needed - Plan for outpatient colonoscopy    #Hematuria Significant for large hemoglobin on dipstick.  This pattern is reflected in UAs for the last 4 years.  She denies dysuria. -f/ up outpatient   Chronic conditions: History of CAD status post CABG: Restart home medication aspirin and Crestor Hypertension: Restart home medication losartan 25 mg daily, metoprolol 12.5 mg twice daily HFpEF: Restart home medication Lasix 40 mg as needed, metoprolol 12.5 mg twice daily  ------------------------------------------------------------------------------------------------------------------------  FOR MZ: - PLEASE MAKE SURE SHE HAS A BOWEL MOVEMENT - Imaging concerning for colitis vs infiltrative disease - if she spikes a fever, low threshold to start empiric Abx.

## 2023-05-24 NOTE — Inpatient Diabetes Management (Signed)
Inpatient Diabetes Program Recommendations  AACE/ADA: New Consensus Statement on Inpatient Glycemic Control (2015)  Target Ranges:  Prepandial:   less than 140 mg/dL      Peak postprandial:   less than 180 mg/dL (1-2 hours)      Critically ill patients:  140 - 180 mg/dL   Lab Results  Component Value Date   GLUCAP 136 (H) 05/24/2023   HGBA1C 9.8 (A) 05/20/2023    Review of Glycemic Control  Diabetes history: DM 2, Sees Endo at [facility] Outpatient Diabetes medications: Lantus 9 units bid, Novolog 4-8 units tid, Dexcom CGM Current orders for Inpatient glycemic control:  Semglee 10 units qhs Novolog 0-20 units tid  A1c 9.8% on 11/25  Spoke w/pt at bedside verifying home doses of insulin. Reviewed her A1c. Pt just had an appointment with her Endocrinologist on Monday in which they are trying to get her back on her a Omipod insulin pump. Pt reports using a dexcom for glucose checks at home.   Thanks, Christena Deem RN, MSN, BC-ADM Inpatient Diabetes Coordinator Team Pager [phone] (8a-5p)

## 2023-05-24 NOTE — Progress Notes (Signed)
HD#1 SUBJECTIVE:  Patient Summary: [name] is a 57 y.o. female with past medical history of uncontrolled type 1 diabetes, CAD s/p CABG x3 (LIMA to LAD, SVG to PDA, SVG to OM) in 12/2017, R femoral fracture (s/p intramedullary nail in 04/2019, history of gastroparesis, CKD 3 A, history of recurrent UTIs, HFpEF who presents with abdominal pain and elevated blood sugars admitted for DKA.    Overnight Events: Transitioned off of endotool   Interim History: Patient is evaluated at bedside this morning, she is a poor historian, slow to answer questions, did not answer many questions. She continued to endorse nausea, generalized abdominal pain generalized body aches.  Did not answer whether she was having any chest pain or shortness of breath.  Reports her last bowel movement was on Tuesday, did not elaborate further, during the beginning of the conversation, she slowly started hyperventilating with increased work of breathing, appeared diaphoretic. Though Oxygen saturation above 96%. She reported that her neck was hurting. She was slow to respond to commands.  OBJECTIVE:  Vital Signs: Vitals:   05/23/23 1931 05/24/23 0010 05/24/23 0400 05/24/23 0812  BP: (!) 123/47 (!) 127/48 (!) 142/56 (!) 153/73  Pulse: 82 78 76   Resp: 17 18 16 18   Temp: 99 F (37.2 C) 99 F (37.2 C) 99.5 F (37.5 C) 99 F (37.2 C)  TempSrc: Oral Oral Oral Oral  SpO2: 93% 95% 94%   Weight:       Supplemental O2: Room Air SpO2: 94 %  Filed Weights   05/23/23 0900  Weight: 81.6 kg     Intake/Output Summary (Last 24 hours) at 05/24/2023 1006 Last data filed at 05/24/2023 0209 Gross per 24 hour  Intake 3151.67 ml  Output --  Net 3151.67 ml   Net IO Since Admission: 3,151.67 mL [05/24/23 1006]  Physical Exam: Physical Exam  General: Patient is resting comfortably in bed in no acute distress   Cardio: Regular rate and rhythm, no murmurs, rubs or gallops. 2+ pulses to bilateral upper and lower extremities   Pulmonary: Clear to ausculation bilaterally with no rales, rhonchi, and crackles  Abdomen: Soft, bowel sounds present. No signs of grimace or pain reflex upon palpation with stethoscope.  MSK: +tenderness bilateral LE upon palpation    Patient Lines/Drains/Airways Status     Active Line/Drains/Airways     Name Placement date Placement time Site Days   Peripheral IV 05/23/23 20 G 1.88" Anterior;Right;Upper Arm 05/23/23  0139  Arm  1   Peripheral IV 05/23/23 22 G 2.5" Anterior;Right;Lateral Forearm 05/23/23  1327  Forearm  1   External Urinary Catheter 01/07/22  2215  --  502   External Urinary Catheter 05/23/23  2000  --  1   Incision - 3 Ports Other (Comment) Right Mid Left 05/10/19  1347  -- 1475   Pressure Injury 10/04/21 Heel Left Stage 3 -  Full thickness tissue loss. Subcutaneous fat may be visible but bone, tendon or muscle are NOT exposed. pink, red, white 10/04/21  1710  -- 597   Pressure Injury 10/04/21 Heel Right Unstageable - Full thickness tissue loss in which the base of the injury is covered by slough (yellow, tan, gray, green or brown) and/or eschar (tan, brown or black) in the wound bed. brown/black 10/04/21  1710  -- 597             ASSESSMENT/PLAN:  Assessment: Active Problems:   DKA (diabetic ketoacidosis) (HCC)   Plan: #DKA  #AGMA #NAGMA #  Hypokalemia Presented with 2 days of abdominal pain, vomiting, hyperglycemia.  In ED,  Bicarb 17 with AG 18, VBG with pH of 7.29, beta hydroxy elevated at 4.8 consistent with DKA. She denied any sick symptoms of cough, congestion or sore throat. She reports adherence with medications, reports she takes 9 units of long acting twice daily with SSI during meals. Reports that blood sugars have been >200-300 this past week. Presented with K 3.3, likely in the setting of vomiting and decrease PO intake. Repleted. She was started on Endo tool initially, has been transitioned off last night with AG closing x 2. She is started on  Semglee 10 units at night with sliding scale insulin.  She started on her diet as well.   Plan:  -Semglee 10 units at night, SSI -Encourage p.o. intake -Start Lyrica 75 mg daily, renally dosed -PT eval   #AKI in the setting of CKD Stage IIIA Present with creatinine of 1.7, baseline appears to be around 1.2-1.7, likely prerenal in the setting of dehydration.  She is status post receiving fluids.  Creatinine currently at 1.58, will continue to trend. -Continue to trend creatinine -LR infusion   #Abdominal pain #Nausea #History of gastroparesis Patient continues to endorse generalized abdominal pain.  She says at baseline she has some pain due to gastroparesis.  Lipase was within normal limits.  CT abdomen pelvis showed wall thickening in the distal ascending colon which could be due to under distention/colitis/infiltrative disease.  Also showed constipation and diverticulosis, small hiatal hernia, chronic calcified pancreatitis, small umbilical fat hernia.  With her history of gastroparesis and chronic conditions, abdominal pain is multifactorial in this setting, can be likely due to DKA.  Home medications include Zofran and Reglan 3 times daily with meals.  Unclear if she has been taking these medications.  Per my evaluation today, she continues to endorse generalized abdominal pain, decreased p.o. intake, nausea.  She reports that she has not had a bowel movement since Tuesday, will schedule MiraLAX twice daily to help with constipation. -IV Zofran 4 mg every 6 hours as needed for nausea/vomiting -MiraLAX and senna bowel regimen to help with constipation -IV Dilaudid 0.5 mg every 6 hours as needed  #Hematuria Significant for large hemoglobin on dipstick.  This pattern is reflected in UAs for the last 4 years.  She denies dysuria. -f/ up outpatient  Chronic conditions: History of CAD status post CABG: Restart home medication aspirin and Crestor Hypertension: Restart home medication losartan  25 mg daily, metoprolol 12.5 mg twice daily HFpEF: Restart home medication Lasix 40 mg as needed, metoprolol 12.5 mg twice daily   Best Practice: Diet: Diabetic diet IVF: Fluids: LR VTE: enoxaparin (LOVENOX) injection 40 mg Start: 05/23/23 1000 Code: Full AB: None  Therapy Recs: None, DME: none DISPO: Anticipated discharge  1-2 days  to Home pending  medical management .  Signature: Jeral Pinch, D.O.  Internal Medicine Resident, PGY-1 [facility]  Pager: [phone] 10:06 AM, 05/24/2023   Please contact the on call pager after 5 pm and on weekends at [phone].

## 2023-05-24 NOTE — Evaluation (Signed)
Physical Therapy Evaluation Patient Details Name: [name] MRN: [id number] DOB: 1966/02/01 Today's Date: 05/24/2023  History of Present Illness  57 yo F who presented on 05/23/23 due to abdominal pain and elevated blood sugars. Glucose 412 in the ED, CT with wall thickening in distal ascending colon. Admitted with DKA, AGMA, NAGMA, and hypokalemia. Of note, has a long history of poor DM1 control, given haldol, benadryl, dilaudid, compazine, and morphine in the ER due to abdominal pain and has had AMS since. PMH DM, gastroparesis, CAD s/p CABG, HFpEF, hx femur fx s/p IM nail  Clinical Impression   Pt received in bed, lethargic but able to interact with therapist with increased time/use of simple questions and prompts. A&Ox3 and appropriate with PT, refused EOB/OOB mobility today due to L shoulder pain and general malaise. Able to complete strength testing in the bed, unsure if she is truly severely weak or if command following is limited by cognition/lethargy today but had a difficult time even lifting legs against weight of blankets. Given medical diagnosis would certainly expect her cognition, and following that mobility, to improve with ongoing medical interventions. Will follow closely, anticipate that we may be able to upgrade PT recommendations.         If plan is discharge home, recommend the following: A lot of help with bathing/dressing/bathroom;Assist for transportation;Assistance with cooking/housework;Direct supervision/assist for medications management;A lot of help with walking and/or transfers;Help with stairs or ramp for entrance   Can travel by private vehicle        Equipment Recommendations Other (comment) (TBD)  Recommendations for Other Services       Functional Status Assessment Patient has had a recent decline in their functional status and demonstrates the ability to make significant improvements in function in a reasonable and predictable amount of time.      Precautions / Restrictions Precautions Precautions: Fall Restrictions Weight Bearing Restrictions: No      Mobility  Bed Mobility               General bed mobility comments: refused    Transfers                   General transfer comment: refused    Ambulation/Gait               General Gait Details: refused  Stairs            Wheelchair Mobility     Tilt Bed    Modified Rankin (Stroke Patients Only)       Balance                                             Pertinent Vitals/Pain Pain Assessment Pain Assessment: Faces Faces Pain Scale: Hurts even more Pain Location: L shoulder Pain Descriptors / Indicators: Discomfort Pain Intervention(s): Limited activity within patient's tolerance, Monitored during session    Home Living Family/patient expects to be discharged to:: Private residence Living Arrangements: Alone Available Help at Discharge: Family;Available PRN/intermittently Type of Home: Apartment Home Access: Level entry       Home Layout: One level Home Equipment: Agricultural consultant (2 wheels);BSC/3in1;Tub bench Additional Comments: some PLOF information taken from prior charting, pt able to provide some information well but other information was not consistent with prior hx in chart    Prior Function Prior Level of Function : Needs assist  Extremity/Trunk Assessment   Upper Extremity Assessment Upper Extremity Assessment: Generalized weakness    Lower Extremity Assessment Lower Extremity Assessment: Generalized weakness    Cervical / Trunk Assessment Cervical / Trunk Assessment: Kyphotic  Communication      Cognition Arousal: Lethargic Behavior During Therapy: Flat affect Overall Cognitive Status: Impaired/Different from baseline                                 General Comments: A&Ox3 with PT, only able to name part of reason she is here in the  hospital ("my stomach"). Slow to answer questions and very soft spoken today, became emotional when encouraged to try a little more with PT stating "I don't feel good I can't"        General Comments General comments (skin integrity, edema, etc.): refused all OOB activity    Exercises     Assessment/Plan    PT Assessment Patient needs continued PT services  PT Problem List Decreased strength;Decreased balance;Decreased cognition;Pain;Decreased range of motion;Decreased mobility;Obesity;Decreased activity tolerance;Decreased coordination       PT Treatment Interventions DME instruction;Therapeutic activities;Cognitive remediation;Gait training;Therapeutic exercise;Patient/family education;Balance training;Stair training;Functional mobility training;Neuromuscular re-education    PT Goals (Current goals can be found in the Care Plan section)  Acute Rehab PT Goals Patient Stated Goal: unstated PT Goal Formulation: With patient Time For Goal Achievement: 06/07/23 Potential to Achieve Goals: Good    Frequency Min 1X/week     Co-evaluation               AM-PAC PT "6 Clicks" Mobility  Outcome Measure Help needed turning from your back to your side while in a flat bed without using bedrails?: A Lot Help needed moving from lying on your back to sitting on the side of a flat bed without using bedrails?: A Lot Help needed moving to and from a bed to a chair (including a wheelchair)?: Total Help needed standing up from a chair using your arms (e.g., wheelchair or bedside chair)?: Total Help needed to walk in hospital room?: Total Help needed climbing 3-5 steps with a railing? : Total 6 Click Score: 8    End of Session   Activity Tolerance: Patient limited by lethargy;Patient limited by pain Patient left: in bed;with call bell/phone within reach;with bed alarm set Nurse Communication: Mobility status PT Visit Diagnosis: Muscle weakness (generalized) (M62.81);Other abnormalities  of gait and mobility (R26.89);Difficulty in walking, not elsewhere classified (R26.2);Pain Pain - Right/Left: Left Pain - part of body: Shoulder    Time: 1210-1220 PT Time Calculation (min) (ACUTE ONLY): 10 min   Charges:   PT Evaluation $PT Eval Moderate Complexity: 1 Mod   PT General Charges $$ ACUTE PT VISIT: 1 Visit        Nedra Hai, PT, DPT 05/24/23 12:39 PM

## 2023-05-25 DIAGNOSIS — E876 Hypokalemia: Secondary | ICD-10-CM | POA: Diagnosis not present

## 2023-05-25 DIAGNOSIS — E1122 Type 2 diabetes mellitus with diabetic chronic kidney disease: Secondary | ICD-10-CM | POA: Diagnosis not present

## 2023-05-25 DIAGNOSIS — N179 Acute kidney failure, unspecified: Secondary | ICD-10-CM | POA: Diagnosis not present

## 2023-05-25 DIAGNOSIS — E1110 Type 2 diabetes mellitus with ketoacidosis without coma: Secondary | ICD-10-CM | POA: Diagnosis not present

## 2023-05-25 DIAGNOSIS — K5900 Constipation, unspecified: Secondary | ICD-10-CM

## 2023-05-25 LAB — COMPREHENSIVE METABOLIC PANEL
ALT: 14 U/L (ref 0–44)
AST: 20 U/L (ref 15–41)
Albumin: 2.6 g/dL — ABNORMAL LOW (ref 3.5–5.0)
Alkaline Phosphatase: 81 U/L (ref 38–126)
Anion gap: 6 (ref 5–15)
BUN: 28 mg/dL — ABNORMAL HIGH (ref 6–20)
CO2: 22 mmol/L (ref 22–32)
Calcium: 8.4 mg/dL — ABNORMAL LOW (ref 8.9–10.3)
Chloride: 111 mmol/L (ref 98–111)
Creatinine, Ser: 1.44 mg/dL — ABNORMAL HIGH (ref 0.44–1.00)
GFR, Estimated: 43 mL/min — ABNORMAL LOW (ref >60)
Glucose, Bld: 316 mg/dL — ABNORMAL HIGH (ref 70–99)
Potassium: 3.8 mmol/L (ref 3.5–5.1)
Sodium: 139 mmol/L (ref 135–145)
Total Bilirubin: 1 mg/dL (ref <1.2)
Total Protein: 6.3 g/dL — ABNORMAL LOW (ref 6.5–8.1)

## 2023-05-25 LAB — GLUCOSE, CAPILLARY
Glucose-Capillary: 102 mg/dL — ABNORMAL HIGH (ref 70–99)
Glucose-Capillary: 129 mg/dL — ABNORMAL HIGH (ref 70–99)
Glucose-Capillary: 263 mg/dL — ABNORMAL HIGH (ref 70–99)
Glucose-Capillary: 350 mg/dL — ABNORMAL HIGH (ref 70–99)
Glucose-Capillary: 389 mg/dL — ABNORMAL HIGH (ref 70–99)
Glucose-Capillary: 82 mg/dL (ref 70–99)
Glucose-Capillary: 86 mg/dL (ref 70–99)

## 2023-05-25 LAB — CBC
HCT: 38.2 % (ref 36.0–46.0)
Hemoglobin: 11.8 g/dL — ABNORMAL LOW (ref 12.0–15.0)
MCH: 27.2 pg (ref 26.0–34.0)
MCHC: 30.9 g/dL (ref 30.0–36.0)
MCV: 88 fL (ref 80.0–100.0)
Platelets: 220 10*3/uL (ref 150–400)
RBC: 4.34 MIL/uL (ref 3.87–5.11)
RDW: 13.3 % (ref 11.5–15.5)
WBC: 5 10*3/uL (ref 4.0–10.5)
nRBC: 0 % (ref 0.0–0.2)

## 2023-05-25 LAB — BETA-HYDROXYBUTYRIC ACID: Beta-Hydroxybutyric Acid: 0.65 mmol/L — ABNORMAL HIGH (ref 0.05–0.27)

## 2023-05-25 MED ORDER — INSULIN GLARGINE-YFGN 100 UNIT/ML ~~LOC~~ SOLN
14.0000 [IU] | Freq: Every day | SUBCUTANEOUS | Status: DC
  Administered 2023-05-26: 14 [IU] via SUBCUTANEOUS
  Filled 2023-05-25 (×2): qty 0.14

## 2023-05-25 MED ORDER — MELATONIN 3 MG PO TABS
3.0000 mg | ORAL_TABLET | Freq: Every day | ORAL | Status: DC
  Administered 2023-05-25: 3 mg via ORAL
  Filled 2023-05-25: qty 1

## 2023-05-25 MED ORDER — POLYETHYLENE GLYCOL 3350 17 G PO PACK
17.0000 g | PACK | Freq: Two times a day (BID) | ORAL | Status: DC
  Administered 2023-05-25 – 2023-05-26 (×2): 17 g via ORAL
  Filled 2023-05-25: qty 1

## 2023-05-25 MED ORDER — POLYETHYLENE GLYCOL 3350 17 G PO PACK: 17.0000 g | PACK | ORAL | Status: DC | PRN

## 2023-05-25 NOTE — Progress Notes (Addendum)
HD#2 SUBJECTIVE:  Patient Summary: [name] is a 57 y.o. female with past medical history of uncontrolled type 1 diabetes, CAD s/p CABG x3 (LIMA to LAD, SVG to PDA, SVG to OM) in 12/2017, R femoral fracture (s/p intramedullary nail in 04/2019, history of gastroparesis, CKD 3 A, history of recurrent UTIs, HFpEF who presents with abdominal pain and elevated blood sugars admitted for DKA.    Overnight Events: none  Interim History: Patient is evaluated at bedside this morning, states improvement from yesterday.  Denies any nausea and abdominal pain has improved.  Able to eat her breakfast this morning.  Endorses passing gas but no bowel movement.  States she has been dealing with constipation for several years since her 57s.  Takes Dulcolax and Colace at home with relief.  OBJECTIVE:  Vital Signs: Vitals:   05/24/23 1614 05/24/23 1817 05/24/23 1943 05/25/23 0531  BP: 126/82 126/82 (!) 108/44 (!) 147/62  Pulse: 67 69 70 69  Resp: 20 18 18 16   Temp:  98.4 F (36.9 C) 99.1 F (37.3 C) 98.7 F (37.1 C)  TempSrc:  Oral Oral Oral  SpO2: 93%  94% 96%  Weight:  81.6 kg    Height:  5\' 1"  (1.549 m)     Supplemental O2: Room Air SpO2: 96 %  Filed Weights   05/23/23 0900 05/24/23 1817  Weight: 81.6 kg 81.6 kg     Intake/Output Summary (Last 24 hours) at 05/25/2023 5784 Last data filed at 05/25/2023 0600 Gross per 24 hour  Intake 690 ml  Output 50 ml  Net 640 ml   Net IO Since Admission: 3,791.67 mL [05/25/23 0635]  Physical Exam: General: Patient sitting up in bed, in no acute distress   Cardio: RRR Pulmonary: Normal work of breathing on room air Abdomen: Soft, bowel sounds present. Decreased abdominal tenderness. No guarding or rebound.   Patient Lines/Drains/Airways Status     Active Line/Drains/Airways     Name Placement date Placement time Site Days   Peripheral IV 05/23/23 20 G 1.88" Anterior;Right;Upper Arm 05/23/23  0139  Arm  1   Peripheral IV 05/23/23 22 G 2.5"  Anterior;Right;Lateral Forearm 05/23/23  1327  Forearm  1   External Urinary Catheter 01/07/22  2215  --  502   External Urinary Catheter 05/23/23  2000  --  1   Incision - 3 Ports Other (Comment) Right Mid Left 05/10/19  1347  -- 1475   Pressure Injury 10/04/21 Heel Left Stage 3 -  Full thickness tissue loss. Subcutaneous fat may be visible but bone, tendon or muscle are NOT exposed. pink, red, white 10/04/21  1710  -- 597   Pressure Injury 10/04/21 Heel Right Unstageable - Full thickness tissue loss in which the base of the injury is covered by slough (yellow, tan, gray, green or brown) and/or eschar (tan, brown or black) in the wound bed. brown/black 10/04/21  1710  -- 597             ASSESSMENT/PLAN:  Assessment: Active Problems:   DKA (diabetic ketoacidosis) (HCC)   Plan: #T1DM #DKA, resolved #AGMA, resolved #NAGMA #Hypokalemia, resolved Tolerating po intake. Her presenting symptoms have improved. CBG 350 this morning. Will need to adjust insulin regimen, PTA was on Lantus 9 units BID.  -Increase Semglee to 14 units daily -Continue SSI (resistant) -Continue Lyrica 75 mg BID  #AKI in the setting of CKD Stage IIIA, improving Creatinine trending down to 1.44 after receiving fluids. Baseline around 1.2-1.4. Encourage po intake.  -  Trend renal function -Encourage adequate po intake  #Abdominal pain #Nausea #History of gastroparesis #Ascending colon wall thickening with abrupt change #Constipation Notes abdominal pain improved some compared to yesterday. CT abdomen pelvis showed wall thickening in the distal ascending colon which could be due to under distention/colitis/infiltrative disease.  Noted abrupt change from non-thickened wall of proximal ascending to thickened portion. Also showed constipation and diverticulosis, small hiatal hernia, chronic calcified pancreatitis, small umbilical fat hernia. Addressing constipation and notes passing gas but no BM yet. Discussed with  patient regarding bowel regimen.  -IV Zofran 4 mg every 6 hours as needed for nausea/vomiting -MiraLAX and senna bowel regimen to help with constipation -Tylenol PRN and IV dilaudid 0.5 mg Q6H PRN severe -Recommend outpatient colonoscopy to further evaluate   #Hematuria Significant for large hemoglobin on dipstick.  This pattern is reflected in UAs for the last 4 years.  She denies dysuria. Plan to f/u in outpatient setting.   Chronic conditions: History of CAD status post CABG: Continue home medication aspirin and Crestor 20 mg Hypertension: Continue home medication losartan 25 mg daily, metoprolol 12.5 mg twice daily HFpEF: Continue home medication Lasix 40 mg as needed, metoprolol 12.5 mg twice daily  Best Practice: Diet: Diabetic diet IVF: Fluids: none VTE: enoxaparin (LOVENOX) injection 40 mg Start: 05/23/23 1000 Code: Full AB: None  Therapy Recs: None, DME: none DISPO: Anticipated discharge  1-2 days  to Home pending  medical stability for diabetes and renal function, able to have BM .  Signature: Rana Snare, DO Internal Medicine Resident PGY-2 Pager: [phone] Please contact the on-call pager after 5 pm and on weekends at [phone]. 6:35 AM, 05/25/2023

## 2023-05-25 NOTE — Plan of Care (Signed)
Patient has been sitting in the chair most of the day, with complaints of a toothache which we are giving tylenol. Sugars have been better controlled and will continue to monitor patient

## 2023-05-26 LAB — BASIC METABOLIC PANEL
Anion gap: 7 (ref 5–15)
BUN: 27 mg/dL — ABNORMAL HIGH (ref 6–20)
CO2: 22 mmol/L (ref 22–32)
Calcium: 8.8 mg/dL — ABNORMAL LOW (ref 8.9–10.3)
Chloride: 109 mmol/L (ref 98–111)
Creatinine, Ser: 1.54 mg/dL — ABNORMAL HIGH (ref 0.44–1.00)
GFR, Estimated: 39 mL/min — ABNORMAL LOW (ref >60)
Glucose, Bld: 255 mg/dL — ABNORMAL HIGH (ref 70–99)
Potassium: 4 mmol/L (ref 3.5–5.1)
Sodium: 138 mmol/L (ref 135–145)

## 2023-05-26 LAB — GLUCOSE, CAPILLARY
Glucose-Capillary: 289 mg/dL — ABNORMAL HIGH (ref 70–99)
Glucose-Capillary: 254 mg/dL — ABNORMAL HIGH (ref 70–99)

## 2023-05-26 MED ORDER — PREGABALIN 75 MG PO CAPS: 75.0000 mg | ORAL_CAPSULE | Freq: Every day | ORAL | 0 refills | Status: DC

## 2023-05-26 MED ORDER — POLYETHYLENE GLYCOL 3350 17 G PO PACK: 17.0000 g | PACK | Freq: Two times a day (BID) | ORAL | 0 refills | Status: AC

## 2023-05-26 MED ORDER — SENNOSIDES-DOCUSATE SODIUM 8.6-50 MG PO TABS: 1.0000 | ORAL_TABLET | Freq: Every day | ORAL | 0 refills | Status: DC

## 2023-05-26 NOTE — Progress Notes (Signed)
Physical Therapy Treatment Patient Details Name: [name] MRN: [id number] DOB: 1965/12/12 Today's Date: 05/26/2023   History of Present Illness 57 yo F who presented on 05/23/23 due to abdominal pain and elevated blood sugars. Glucose 412 in the ED, CT with wall thickening in distal ascending colon. Admitted with DKA, AGMA, NAGMA, and hypokalemia. Of note, has a long history of poor DM1 control, given haldol, benadryl, dilaudid, compazine, and morphine in the ER due to abdominal pain and has had AMS since. PMH DM, gastroparesis, CAD s/p CABG, HFpEF, hx femur fx s/p IM nail    PT Comments  Pt is progressing well. Pt is currently very close to baseline. Significant improvement from last session. Pt is Mod I for transfers from recliner/BSC and gait with RW. No overt LOB. Due to pt current level of function, PLOF, home set up and available assistance at home no recommended skilled physical therapy services at this time on discharge from acute care hospital setting. Pt tolerated treatment session well.     If plan is discharge home, recommend the following: Assistance with cooking/housework     Equipment Recommendations  None recommended by PT       Precautions / Restrictions Precautions Precautions: Fall Restrictions Weight Bearing Restrictions: No     Mobility  Bed Mobility   General bed mobility comments: Pt sitting on BSC upon entry and in recliner at departure.    Transfers Overall transfer level: Modified independent Equipment used: Rolling walker (2 wheels)     General transfer comment: good hand placement.    Ambulation/Gait Ambulation/Gait assistance: Modified independent (Device/Increase time) Gait Distance (Feet): 300 Feet Assistive device: Rolling walker (2 wheels) Gait Pattern/deviations: WFL(Within Functional Limits) Gait velocity: slightly decreased Gait velocity interpretation: 1.31 - 2.62 ft/sec, indicative of limited community ambulator   General Gait  Details: no significant deviations       Balance Overall balance assessment: No apparent balance deficits (not formally assessed)        Cognition Arousal: Alert Behavior During Therapy: WFL for tasks assessed/performed Overall Cognitive Status: Within Functional Limits for tasks assessed           General Comments General comments (skin integrity, edema, etc.): No noted skin deviations from outside gown.      Pertinent Vitals/Pain Pain Assessment Pain Assessment: No/denies pain     PT Goals (current goals can now be found in the care plan section) Acute Rehab PT Goals Patient Stated Goal: return home PT Goal Formulation: With patient Time For Goal Achievement: 06/07/23 Potential to Achieve Goals: Good Progress towards PT goals: Progressing toward goals    Frequency    Min 1X/week      PT Plan  Continue with current POC       AM-PAC PT "6 Clicks" Mobility   Outcome Measure  Help needed turning from your back to your side while in a flat bed without using bedrails?: None Help needed moving from lying on your back to sitting on the side of a flat bed without using bedrails?: None Help needed moving to and from a bed to a chair (including a wheelchair)?: None Help needed standing up from a chair using your arms (e.g., wheelchair or bedside chair)?: None Help needed to walk in hospital room?: None Help needed climbing 3-5 steps with a railing? : A Little 6 Click Score: 23    End of Session   Activity Tolerance: Patient tolerated treatment well Patient left: in chair;with call bell/phone within reach Nurse Communication: Mobility  status PT Visit Diagnosis: Muscle weakness (generalized) (M62.81);Other abnormalities of gait and mobility (R26.89);Difficulty in walking, not elsewhere classified (R26.2);Pain     Time: 1224-1239 PT Time Calculation (min) (ACUTE ONLY): 15 min  Charges:    $Therapeutic Activity: 8-22 mins PT General Charges $$ ACUTE PT  VISIT: 1 Visit                     Harrel Carina, DPT, CLT  Acute Rehabilitation Services Office: [phone] (Secure chat preferred)    Claudia Desanctis 05/26/2023, 12:51 PM

## 2023-05-26 NOTE — Discharge Summary (Signed)
Name: [name] MRN: [id number] DOB: [birth year] 57 y.o. PCP: Hoy Register, MD  Date of Admission: 05/23/2023 12:15 AM Date of Discharge: 05/26/2023  Attending Physician: Dr. Criselda Peaches  DISCHARGE DIAGNOSIS:  Primary Problem: <principal problem not specified>   Hospital Problems: Active Problems:   DKA (diabetic ketoacidosis) (HCC)    DISCHARGE MEDICATIONS:   Allergies as of 05/26/2023       Reactions   Anesthetics, Amide Nausea And Vomiting   Chlorhexidine Other (See Comments)   Unknown reaction   Penicillins Diarrhea, Nausea And Vomiting, Other (See Comments)   Enkaid [encainide] Nausea And Vomiting   Subutex [buprenorphine] Rash        Medication List     STOP taking these medications    oxyCODONE-acetaminophen 10-325 MG tablet Commonly known as: PERCOCET   terbinafine 1 % cream Commonly known as: LamISIL AT   traMADol 50 MG tablet Commonly known as: ULTRAM       TAKE these medications    acetaminophen 500 MG tablet Commonly known as: TYLENOL Take 1,000 mg by mouth at bedtime as needed for mild pain.   ammonium lactate 12 % cream Commonly known as: AMLACTIN Apply 1 Application topically as needed for dry skin.   aspirin EC 81 MG tablet Take 1 tablet (81 mg total) by mouth daily.   BD Insulin Syringe U/F 31G X 5/16" 0.5 ML Misc Generic drug: Insulin Syringe-Needle U-100 USE FOUR TIMES DAILY FOR INSULIN   furosemide 40 MG tablet Commonly known as: LASIX TAKE 1 TABLET(40 MG) BY MOUTH DAILY   insulin aspart 100 UNIT/ML injection Commonly known as: NovoLOG 4-8 units three times a day 15 min before meals   insulin glargine 100 UNIT/ML injection Commonly known as: Lantus Lantus 9 units bid   Insulin Syringes (Disposable) U-100 0.3 ML Misc Use to draw up insulin as instructed   losartan 25 MG tablet Commonly known as: COZAAR Take 1 tablet (25 mg total) by mouth daily. Hold this medication if your systolic blood pressure ( top number) s  less than 100. Please call your doctor is your systolic blood pressure is less than 100   metoprolol tartrate 25 MG tablet Commonly known as: LOPRESSOR TAKE 1/2 TABLET BY MOUTH TWICE DAILY. CHECK YOUR BLOOD PRESSURE TWICE DAILY. HOLD THIS MEDICATION IF SYSTOLIC BLOOD PRESSURE IS LESS THAN   ondansetron 4 MG tablet Commonly known as: Zofran Take 1 tablet (4 mg total) by mouth every 8 (eight) hours as needed for nausea or vomiting.   polyethylene glycol 17 g packet Commonly known as: MIRALAX / GLYCOLAX Take 17 g by mouth 2 (two) times daily.   pregabalin 75 MG capsule Commonly known as: LYRICA Take 1 capsule (75 mg total) by mouth daily. What changed:  medication strength how much to take when to take this   rosuvastatin 20 MG tablet Commonly known as: CRESTOR TAKE 1 TABLET(20 MG) BY MOUTH DAILY   senna-docusate 8.6-50 MG tablet Commonly known as: Senokot-S Take 1 tablet by mouth at bedtime.        DISPOSITION AND FOLLOW-UP:  [name] was discharged from [facility] in Stable condition. At the hospital follow up visit please address:  T1DM/DKA: She is discharged with her home dose insulin glargine 9 units twice daily and sliding scale.  She is encouraged to follow-up with her primary care physician for OmniPod.  Diabetic neuropathy: She is discharged with Lyrica 75 mg daily, changed from her initial medication because of low creatinine clearance of  39.5. Abdominal pain, nausea, ascending colon wall thickening, constipation: She did not have a bowel movement during this hospitalization, she had refused stool softeners on the received 1 dose of MiraLAX, reported that she wanted to go home and have a bowel movement.  She reported that she had Colace and Dulcolax at home. Please make sure she has bowel movements. I did send her home with additional bowel regimen of MiraLAX and senna. Please make sure she has a colonoscopy scheduled with her  gastroenterologist  History of hematuria: Please follow-up outpatient, she denied any dysuria. AKI: Please check creatinine, she is discharged with creatinine closer to baseline. Please encouraged adequate p.o. intake.  Follow-up Recommendations: Consults: None  Labs: Basic Metabolic Profile, CBC, and Urinalysis Studies: Colonoscopy Medications: We only changed her pregabalin to 75 mg daily due to decreased creatinine clearance; otherwise no other changes were made to her home medications.  She was also sent home with senna 8.6-50 mg 1 tablet at bedtime and MiraLAX.  Follow-up Appointments:  Follow-up Information     Hoy Register, MD. Schedule an appointment as soon as possible for a visit.   Specialty: Family Medicine Why: Please schedule hospital follow up appointment as soon as possible. Please ensure patient follows with her gastroenterologist at [facility]. Contact information: [address] [place] [address] [phone]         Lasandra Beech [place], [place] Follow up.   Specialty: Physician Assistant Why: Patient is due for colonoscopy with her outpatient gastroenterologist (GI provider). Contact information: [address] [place] [address] [phone]                 HOSPITAL COURSE:  Patient Summary: #DKA  #T1DM #AGMA #NAGMA #Hypokalemia Presented with 2 days of abdominal pain, vomiting, hyperglycemia. In ED,  Bicarb 17 with AG 18, VBG with pH of 7.29, beta hydroxy elevated at 4.8 consistent with DKA. She denied any sick symptoms of cough, congestion or sore throat. She reports adherence with medications, reports she takes 9 units of long acting twice daily with SSI during meals. Reports that blood sugars have been >200-300 this past week. Presented with K 3.3, likely in the setting of vomiting and decrease PO intake. Repleted. She was started on Endo tool initially, then transitioned off 11/28 with AG closing x 2. Started on  Diet. She is started on Semglee 10 units at night with sliding scale insulin, raised to 14 units daily with resistant sliding scale. Symptoms improved overall; she reports that she is doing well.  She denied any abdominal pain, nausea, vomiting. PT evaluated the patient with no recommendation.  Patient was encouraged to schedule an appointment with her primary care doctor to follow-up on insulin regimen.  Of note, for her diabetic neuropathy, she was started on Lyrica 75 mg daily initially due to her low creatinine clearance and discharged on this dose.  #AKI in the setting of CKD Stage IIIA Present with creatinine of 1.7, baseline appears to be around 1.2-1.7, likely prerenal in the setting of dehydration.  She is status post receiving fluids.  We continue to trend her creatinine, upon discharge 1.54.  Patient was encouraged p.o. intake.  #Abdominal pain #Nausea #History of gastroparesis #Ascending colon wall thickening with abrupt change #Constipation Patient continues to endorse generalized abdominal pain.  She says at baseline she has some pain due to gastroparesis.  Lipase was within normal limits.  CT abdomen pelvis showed wall thickening in the distal ascending colon  which could be due to under distention/colitis/infiltrative disease.  Also showed constipation and diverticulosis, small hiatal hernia, chronic calcified pancreatitis, small umbilical fat hernia.  With her history of gastroparesis and chronic conditions, abdominal pain is multifactorial in this setting, can be likely due to DKA.  Home medications include Zofran and Reglan 3 times daily with meals.  Unclear if she has been taking these medications.  She was started on MiraLAX and senna bowel regimen to help with constipation however she has been refusing these medications during this hospitalization, she only received 1 dose of MiraLAX on 11/30. Upon evaluation today, she reported no abdominal pain, nausea, vomiting.  She is tolerating  diet well.  She wanted to go home today.  She reported that she has stool softeners at home, she did not wanted to have a bowel movement here in the hospital she wanted to go home and have a bowel movement.  She reports that she is passing flatus.  Per the physical exam, abdomen is soft, nontender, nondistended.  She was advised to follow-up with her gastroenterologist for colonoscopy.  And follow-up with primary care doctor.   #Hematuria Significant for large hemoglobin on dipstick.  This pattern is reflected in UAs for the last 4 years.  She denies dysuria.  Advised to follow-up outpatient.   Chronic conditions: History of CAD status post CABG: Restart home medication aspirin and Crestor Hypertension: Restart home medication losartan 25 mg daily, metoprolol 12.5 mg twice daily HFpEF: Restart home medication Lasix 40 mg as needed, metoprolol 12.5 mg twice daily  DISCHARGE INSTRUCTIONS:   Discharge Instructions     Diet - low sodium heart healthy   Complete by: As directed    Discharge instructions   Complete by: As directed    Ms. Purcell, You came to the hospital for high blood sugar levels, we treated you with insulin and gave you fluids.  1. For your type 1 diabetes/diabetic ketoacidosis -Please continue taking your long-acting insulin, insulin glargine, 9 units in the morning and 9 units in the evening. -Please continue checking your blood sugars at home -Please follow-up with your primary care doctor as soon as possible for Omni Pod insulin pump system -Please continue hydration and diet  2.  For your abdominal pain, nausea, constipation -Please take bowel regimen that can help with your constipation, as you mentioned that you di not have a B bowel movement here in the hospital. You mentioned that you have stool softeners at home, I am also sending you with additional in case you need it.  -You can take MiraLAX as needed for your mild to moderate constipation -I also sent you with  senna docusate, you can take 1 tablet by mouth at bedtime -Please drink plenty of water  -Please follow-up with your primary care doctor, and schedule an appointment with your GI doctor to schedule a colonoscopy.  3.  For your neuropathic pain -We decreased the dose of your Lyrica to 75 mg daily, because of your kidney functions. Please take Lyrica 75 mg once daily.  Please see your primary care doctor if it needed to be increased.  Otherwise for your other chronic conditions, please continue taking the rest of your medication as prescribed, no other changes has been made.  If you have any of these following symptoms, please call us or seek care at an emergency department: -Chest Pain -Difficulty Breathing -Worsening abdominal pain -Syncope (passing out) -Drooping of face -Slurred speech -Sudden weakness in your leg or arm -Fever -  Chills -blood in the stool -dark black, sticky stool  If you have any questions or concerns, call our clinic at [phone] or after hours call [phone] and ask for the internal medicine resident on call.  I am glad you are feeling better. It was a pleasure taking care for you. I wish a good recovery and good health!   Increase activity slowly   Complete by: As directed        SUBJECTIVE:  Patient is evaluated side, she is laying comfortably no acute distress.  She reports she is doing well this morning, no nausea, no vomiting, no abdominal pain.  No chest pain or shortness of breath.  She is tolerating diet well.  She did report that she has not had a bowel movement yet however she is passing flatus.  She reports that she did not want to have a bowel movement here in the hospital, she wanted to go home to have a bowel movement.  She reports that she wanted to go home today, her son is off today and he is available to pick her up today.   Discharge Vitals:   BP 128/61 (BP Location: Left Arm)   Pulse 67   Temp 98.3 F (36.8 C) (Oral)   Resp 16    Ht 5\' 1"  (1.549 m)   Wt 81.6 kg   SpO2 94%   BMI 33.99 kg/m   OBJECTIVE:  Physical Exam   General: Patient is resting comfortably in bed in no acute distress  Cardio: Regular rate and rhythm, no murmurs, rubs or gallops.  Pulmonary: Clear to ausculation bilaterally with no rales, rhonchi, and crackles  Abdomen: Soft, nontender with normoactive bowel sounds with no rebound or guarding  MSK: 5/5 strength to upper and lower extremities.   Neuro: Alert and oriented x3, no focal deficits   Pertinent Labs, Studies, and Procedures:     Latest Ref Rng & Units 05/25/2023    3:36 AM 05/24/2023    4:14 AM 05/23/2023   12:24 AM  CBC  WBC 4.0 - 10.5 K/uL 5.0  8.0  9.5   Hemoglobin 12.0 - 15.0 g/dL 42.5  95.6  38.7   Hematocrit 36.0 - 46.0 % 38.2  38.0  42.1   Platelets 150 - 400 K/uL 220  229  302        Latest Ref Rng & Units 05/26/2023    5:09 AM 05/25/2023    3:36 AM 05/24/2023    4:14 AM  CMP  Glucose 70 - 99 mg/dL 564  332  951   BUN 6 - 20 mg/dL 27  28  27    Creatinine 0.44 - 1.00 mg/dL 8.84  1.66  0.63   Sodium 135 - 145 mmol/L 138  139  145   Potassium 3.5 - 5.1 mmol/L 4.0  3.8  3.9   Chloride 98 - 111 mmol/L 109  111  115   CO2 22 - 32 mmol/L 22  22  20    Calcium 8.9 - 10.3 mg/dL 8.8  8.4  8.7   Total Protein 6.5 - 8.1 g/dL  6.3  6.6   Total Bilirubin <1.2 mg/dL  1.0  1.1   Alkaline Phos 38 - 126 U/L  81  83   AST 15 - 41 U/L  20  23   ALT 0 - 44 U/L  14  14     CT ABDOMEN PELVIS W CONTRAST  Result Date: 05/23/2023 CLINICAL DATA:  Abdominal pain, nausea, and vomiting.  History significant for diabetes and gastroparesis. EXAM: CT ABDOMEN AND PELVIS WITH CONTRAST TECHNIQUE: Multidetector CT imaging of the abdomen and pelvis was performed using the standard protocol following bolus administration of intravenous contrast. RADIATION DOSE REDUCTION: This exam was performed according to the departmental dose-optimization program which includes automated exposure control,  adjustment of the mA and/or kV according to patient size and/or use of iterative reconstruction technique. CONTRAST:  75mL OMNIPAQUE IOHEXOL 350 MG/ML SOLN COMPARISON:  CT without contrast 11/26/2018, CT with contrast 05/31/2015 FINDINGS: Lower chest: There is posterior atelectasis in the lower lobes. Mild bronchial thickening. No focal pneumonia in the lung bases. The heart is slightly enlarged. Partially visible sternotomy and CABG changes. No pericardial effusion. Small hiatal hernia. Hepatobiliary: No significant focal liver abnormality is seen. Status post cholecystectomy. No biliary dilatation. There is focal periligamentous fat in segment 4 B. Pancreas: There are occasional punctate parenchymal calcifications consistent with chronic calcific pancreatitis. There is no evidence of acute pancreatitis, ductal dilatation or mass. Spleen: No abnormality. Adrenals/Urinary Tract: Adrenal glands are unremarkable. Kidneys are normal, without renal calculi, focal lesion, or hydronephrosis. Bladder is unremarkable. Stomach/Bowel: The stomach is contracted. The small bowel is normal caliber. There is no evidence of appendicitis. There are stones in the appendix. There is mild fecal stasis. The wall of the distal ascending colon is thicker than elsewhere which could be due to underdistention, colitis or infiltrating disease. There is an abrupt change from the non-thickened wall of the proximal ascending colon to the thickened portion on coronal reformatted images 78-82. No other colonic thickening is seen. There is sigmoid diverticulosis. Vascular/Lymphatic: Aortic atherosclerosis. No enlarged abdominal or pelvic lymph nodes. Reproductive: Uterus and bilateral adnexa are unremarkable apart from a 1 cm calcified fundal uterine fibroid. Other: Small umbilical fat hernia. No incarcerated hernia. No free fluid, free hemorrhage or free air. Musculoskeletal: Old right hip nailing. No acute or significant osseous findings. Mild  hip DJD. IMPRESSION: 1. Wall thickening in the distal ascending colon which could be due to underdistention, colitis or infiltrating disease. There is an abrupt change from the non-thickened wall of the proximal ascending colon to the thickened portion. Follow-up colonoscopy or barium enema recommended. 2. Constipation and diverticulosis. 3. Aortic atherosclerosis. 4. Small hiatal hernia. 5. Chronic calcific pancreatitis. No evidence of acute pancreatitis. 6. Small umbilical fat hernia. 7. 1 cm calcified fundal uterine fibroid. Aortic Atherosclerosis (ICD10-I70.0). Electronically Signed   By: Almira Bar M.D.   On: 05/23/2023 07:12     Signed: Jeral Pinch, D.O.  Internal Medicine Resident, PGY-1 [facility]  Pager: [phone] 6:43 PM, 05/26/2023

## 2023-05-26 NOTE — Discharge Instructions (Addendum)
[name], You came to the hospital for high blood sugar levels, we treated you with insulin and gave you fluids.  For your type 1 diabetes/diabetic ketoacidosis -Please continue taking your long-acting insulin, insulin glargine, 9 units in the morning and 9 units in the evening. -Please continue checking your blood sugars at home -Please follow-up with your primary care doctor as soon as possible for Omni Pod insulin pump system -Please continue hydration and diet  2.  For your abdominal pain, nausea, constipation -Please take bowel regimen that can help with your constipation, as you mentioned that you di not have a B bowel movement here in the hospital. You mentioned that you have stool softeners at home, I am also sending you with additional in case you need it.  -You can take MiraLAX as needed for your mild to moderate constipation -I also sent you with senna docusate, you can take 1 tablet by mouth at bedtime -Please drink plenty of water  -Please follow-up with your primary care doctor, and schedule an appointment with your GI doctor to schedule a colonoscopy.  3.  For your neuropathic pain -We decreased the dose of your Lyrica to 75 mg daily, because of your kidney functions. Please take Lyrica 75 mg once daily.  Please see your primary care doctor if it needed to be increased.  Otherwise for your other chronic conditions, please continue taking the rest of your medication as prescribed, no other changes has been made.  If you have any of these following symptoms, please call us or seek care at an emergency department: -Chest Pain -Difficulty Breathing -Worsening abdominal pain -Syncope (passing out) -Drooping of face -Slurred speech -Sudden weakness in your leg or arm -Fever -Chills -blood in the stool -dark black, sticky stool  If you have any questions or concerns, call our clinic at [phone] or after hours call [phone] and ask for the internal medicine  resident on call.  I am glad you are feeling better. It was a pleasure taking care for you. I wish a good recovery and good health!

## 2023-05-27 ENCOUNTER — Other Ambulatory Visit: Payer: Self-pay

## 2023-05-27 ENCOUNTER — Telehealth: Payer: Self-pay

## 2023-05-27 DIAGNOSIS — I10 Essential (primary) hypertension: Secondary | ICD-10-CM

## 2023-05-27 MED ORDER — METOPROLOL TARTRATE 25 MG PO TABS: 25.0000 mg | ORAL_TABLET | Freq: Two times a day (BID) | ORAL | 0 refills | Status: DC

## 2023-05-27 MED ORDER — ROSUVASTATIN CALCIUM 20 MG PO TABS: 20.0000 mg | ORAL_TABLET | Freq: Every day | ORAL | 0 refills | Status: DC

## 2023-05-27 MED ORDER — FUROSEMIDE 40 MG PO TABS: 40.0000 mg | ORAL_TABLET | Freq: Every day | ORAL | 0 refills | Status: DC

## 2023-05-27 NOTE — Transitions of Care (Post Inpatient/ED Visit) (Signed)
   05/27/2023  Name: [name] MRN: [id number] DOB: 04-Apr-1966  Today's TOC FU Call Status:    Attempted to reach the patient regarding the most recent Inpatient/ED visit.  Follow Up Plan: Additional outreach attempts will be made to reach the patient to complete the Transitions of Care (Post Inpatient/ED visit) call.   Alyse Low, RN, BA, [facility], CRRN [facility] Coordinator, Transition of Care Ph # [phone]

## 2023-05-28 ENCOUNTER — Other Ambulatory Visit: Payer: Self-pay

## 2023-05-28 ENCOUNTER — Encounter: Payer: Self-pay | Admitting: Endocrinology

## 2023-05-28 ENCOUNTER — Telehealth: Payer: Self-pay

## 2023-05-28 MED ORDER — LOSARTAN POTASSIUM 25 MG PO TABS: 25.0000 mg | ORAL_TABLET | Freq: Every day | ORAL | 0 refills | Status: DC

## 2023-05-28 NOTE — Transitions of Care (Post Inpatient/ED Visit) (Signed)
   05/28/2023  Name: Fathima Ord MRN: [id number] DOB: [birth year]  Today's TOC FU Call Status: Today's TOC FU Call Status:: Unsuccessful Call (2nd Attempt) Unsuccessful Call (2nd Attempt) Date: 05/28/23  Attempted to reach the patient regarding the most recent Inpatient/ED visit.  Follow Up Plan: Additional outreach attempts will be made to reach the patient to complete the Transitions of Care (Post Inpatient/ED visit) call.   Alyse Low, RN, BA, [facility], CRRN [facility] Coordinator, Transition of Care Ph # [phone]

## 2023-05-29 ENCOUNTER — Telehealth: Payer: Self-pay

## 2023-05-29 ENCOUNTER — Ambulatory Visit: Payer: Medicare HMO | Admitting: Podiatry

## 2023-05-29 NOTE — Transitions of Care (Post Inpatient/ED Visit) (Signed)
   05/29/2023  Name: Chalisa Delre MRN: [id number] DOB: 03/02/1966  Today's TOC FU Call Status: Today's TOC FU Call Status:: Unsuccessful Call (3rd Attempt) Unsuccessful Call (3rd Attempt) Date: 05/29/23  Attempted to reach the patient regarding the most recent Inpatient/ED visit.  Follow Up Plan: No further outreach attempts will be made at this time. We have been unable to contact the patient.  Alyse Low, RN, BA, [facility], CRRN [facility] Coordinator, Transition of Care Ph # [phone]

## 2023-06-12 ENCOUNTER — Telehealth: Payer: Self-pay | Admitting: Nutrition

## 2023-06-12 NOTE — Telephone Encounter (Signed)
Pump training scheduled for 06/25/23.  Please call in a prescription for 3 month supply of OmniPod 5 pods.  The ones that came with the starter kit have expired..  This needs to go to the [facility] on [address]. She will use 1 pod every 3 days.

## 2023-06-13 ENCOUNTER — Ambulatory Visit: Payer: Medicare HMO | Admitting: Physical Therapy

## 2023-06-15 DIAGNOSIS — Z794 Long term (current) use of insulin: Secondary | ICD-10-CM | POA: Diagnosis not present

## 2023-06-15 DIAGNOSIS — Z6835 Body mass index (BMI) 35.0-35.9, adult: Secondary | ICD-10-CM | POA: Diagnosis not present

## 2023-06-15 DIAGNOSIS — G8929 Other chronic pain: Secondary | ICD-10-CM | POA: Diagnosis not present

## 2023-06-15 DIAGNOSIS — E6609 Other obesity due to excess calories: Secondary | ICD-10-CM | POA: Diagnosis not present

## 2023-06-15 DIAGNOSIS — M5442 Lumbago with sciatica, left side: Secondary | ICD-10-CM | POA: Diagnosis not present

## 2023-06-15 DIAGNOSIS — M47816 Spondylosis without myelopathy or radiculopathy, lumbar region: Secondary | ICD-10-CM | POA: Diagnosis not present

## 2023-06-15 DIAGNOSIS — E119 Type 2 diabetes mellitus without complications: Secondary | ICD-10-CM | POA: Diagnosis not present

## 2023-06-15 DIAGNOSIS — M5441 Lumbago with sciatica, right side: Secondary | ICD-10-CM | POA: Diagnosis not present

## 2023-06-15 DIAGNOSIS — Z79899 Other long term (current) drug therapy: Secondary | ICD-10-CM | POA: Diagnosis not present

## 2023-06-17 MED ORDER — OMNIPOD 5 DEXG7G6 INTRO GEN 5 KIT: 1.0000 | PACK | 0 refills | Status: DC | PRN

## 2023-06-17 MED ORDER — DEXCOM G6 SENSOR MISC: 1.0000 | 3 refills | Status: DC

## 2023-06-17 MED ORDER — OMNIPOD 5 DEXG7G6 PODS GEN 5 MISC: 1.0000 | 3 refills | Status: DC

## 2023-06-17 NOTE — Telephone Encounter (Signed)
I have sent prescription for Omnipod 5. Thanks

## 2023-06-17 NOTE — Telephone Encounter (Signed)
Patient called saying that her OmniPod 5 pods have not been called in, and needs them called in today due to insurance coverage.  Please order 1 pod Q 3 days for a 3 month supply to the  [facility] on [address].  I am starting her on her pump on Monday.   Thank you

## 2023-06-21 ENCOUNTER — Other Ambulatory Visit: Payer: Self-pay

## 2023-06-21 ENCOUNTER — Encounter (HOSPITAL_COMMUNITY): Payer: Self-pay | Admitting: Emergency Medicine

## 2023-06-21 ENCOUNTER — Observation Stay (HOSPITAL_COMMUNITY)
Admission: EM | Admit: 2023-06-21 | Discharge: 2023-06-23 | Disposition: A | Discharge status: Home / Self Care | Payer: Medicare HMO | Attending: Emergency Medicine | Admitting: Emergency Medicine

## 2023-06-21 ENCOUNTER — Emergency Department (HOSPITAL_COMMUNITY): Payer: Medicare HMO

## 2023-06-21 DIAGNOSIS — Z7982 Long term (current) use of aspirin: Secondary | ICD-10-CM | POA: Diagnosis not present

## 2023-06-21 DIAGNOSIS — N1831 Chronic kidney disease, stage 3a: Secondary | ICD-10-CM | POA: Insufficient documentation

## 2023-06-21 DIAGNOSIS — K6389 Other specified diseases of intestine: Secondary | ICD-10-CM | POA: Insufficient documentation

## 2023-06-21 DIAGNOSIS — E1022 Type 1 diabetes mellitus with diabetic chronic kidney disease: Secondary | ICD-10-CM | POA: Diagnosis not present

## 2023-06-21 DIAGNOSIS — E1043 Type 1 diabetes mellitus with diabetic autonomic (poly)neuropathy: Secondary | ICD-10-CM | POA: Diagnosis not present

## 2023-06-21 DIAGNOSIS — E1040 Type 1 diabetes mellitus with diabetic neuropathy, unspecified: Secondary | ICD-10-CM | POA: Diagnosis not present

## 2023-06-21 DIAGNOSIS — E1143 Type 2 diabetes mellitus with diabetic autonomic (poly)neuropathy: Secondary | ICD-10-CM | POA: Diagnosis present

## 2023-06-21 DIAGNOSIS — Z1152 Encounter for screening for COVID-19: Secondary | ICD-10-CM | POA: Insufficient documentation

## 2023-06-21 DIAGNOSIS — K5190 Ulcerative colitis, unspecified, without complications: Secondary | ICD-10-CM | POA: Diagnosis not present

## 2023-06-21 DIAGNOSIS — G2401 Drug induced subacute dyskinesia: Secondary | ICD-10-CM | POA: Insufficient documentation

## 2023-06-21 DIAGNOSIS — Z951 Presence of aortocoronary bypass graft: Secondary | ICD-10-CM | POA: Diagnosis not present

## 2023-06-21 DIAGNOSIS — K869 Disease of pancreas, unspecified: Secondary | ICD-10-CM | POA: Diagnosis not present

## 2023-06-21 DIAGNOSIS — E785 Hyperlipidemia, unspecified: Secondary | ICD-10-CM | POA: Diagnosis not present

## 2023-06-21 DIAGNOSIS — I2510 Atherosclerotic heart disease of native coronary artery without angina pectoris: Secondary | ICD-10-CM | POA: Diagnosis not present

## 2023-06-21 DIAGNOSIS — R10819 Abdominal tenderness, unspecified site: Secondary | ICD-10-CM | POA: Diagnosis present

## 2023-06-21 DIAGNOSIS — R319 Hematuria, unspecified: Secondary | ICD-10-CM | POA: Insufficient documentation

## 2023-06-21 DIAGNOSIS — K529 Noninfective gastroenteritis and colitis, unspecified: Secondary | ICD-10-CM

## 2023-06-21 DIAGNOSIS — R112 Nausea with vomiting, unspecified: Secondary | ICD-10-CM | POA: Diagnosis not present

## 2023-06-21 DIAGNOSIS — E1010 Type 1 diabetes mellitus with ketoacidosis without coma: Principal | ICD-10-CM | POA: Insufficient documentation

## 2023-06-21 DIAGNOSIS — Z79899 Other long term (current) drug therapy: Secondary | ICD-10-CM | POA: Insufficient documentation

## 2023-06-21 DIAGNOSIS — I130 Hypertensive heart and chronic kidney disease with heart failure and stage 1 through stage 4 chronic kidney disease, or unspecified chronic kidney disease: Secondary | ICD-10-CM | POA: Diagnosis not present

## 2023-06-21 DIAGNOSIS — R109 Unspecified abdominal pain: Secondary | ICD-10-CM | POA: Insufficient documentation

## 2023-06-21 DIAGNOSIS — I5030 Unspecified diastolic (congestive) heart failure: Secondary | ICD-10-CM | POA: Diagnosis not present

## 2023-06-21 DIAGNOSIS — E1110 Type 2 diabetes mellitus with ketoacidosis without coma: Secondary | ICD-10-CM | POA: Diagnosis present

## 2023-06-21 DIAGNOSIS — E1069 Type 1 diabetes mellitus with other specified complication: Secondary | ICD-10-CM | POA: Diagnosis present

## 2023-06-21 DIAGNOSIS — G64 Other disorders of peripheral nervous system: Secondary | ICD-10-CM | POA: Diagnosis not present

## 2023-06-21 DIAGNOSIS — I10 Essential (primary) hypertension: Secondary | ICD-10-CM | POA: Diagnosis not present

## 2023-06-21 DIAGNOSIS — K3184 Gastroparesis: Secondary | ICD-10-CM | POA: Diagnosis present

## 2023-06-21 DIAGNOSIS — J45909 Unspecified asthma, uncomplicated: Secondary | ICD-10-CM | POA: Diagnosis not present

## 2023-06-21 LAB — COMPREHENSIVE METABOLIC PANEL
ALT: 24 U/L (ref 0–44)
ALT: 23 U/L (ref 0–44)
AST: 30 U/L (ref 15–41)
AST: 28 U/L (ref 15–41)
Albumin: 4 g/dL (ref 3.5–5.0)
Albumin: 3.8 g/dL (ref 3.5–5.0)
Alkaline Phosphatase: 123 U/L (ref 38–126)
Alkaline Phosphatase: 107 U/L (ref 38–126)
Anion gap: 15 (ref 5–15)
Anion gap: 17 — ABNORMAL HIGH (ref 5–15)
BUN: 18 mg/dL (ref 6–20)
BUN: 19 mg/dL (ref 6–20)
CO2: 19 mmol/L — ABNORMAL LOW (ref 22–32)
CO2: 17 mmol/L — ABNORMAL LOW (ref 22–32)
Calcium: 9.8 mg/dL (ref 8.9–10.3)
Calcium: 9.6 mg/dL (ref 8.9–10.3)
Chloride: 101 mmol/L (ref 98–111)
Chloride: 101 mmol/L (ref 98–111)
Creatinine, Ser: 1.5 mg/dL — ABNORMAL HIGH (ref 0.44–1.00)
Creatinine, Ser: 1.69 mg/dL — ABNORMAL HIGH (ref 0.44–1.00)
GFR, Estimated: 41 mL/min — ABNORMAL LOW (ref >60)
GFR, Estimated: 35 mL/min — ABNORMAL LOW (ref >60)
Glucose, Bld: 256 mg/dL — ABNORMAL HIGH (ref 70–99)
Glucose, Bld: 373 mg/dL — ABNORMAL HIGH (ref 70–99)
Potassium: 4.5 mmol/L (ref 3.5–5.1)
Potassium: 4.5 mmol/L (ref 3.5–5.1)
Sodium: 135 mmol/L (ref 135–145)
Sodium: 135 mmol/L (ref 135–145)
Total Bilirubin: 1.6 mg/dL — ABNORMAL HIGH (ref <1.2)
Total Bilirubin: 1.9 mg/dL — ABNORMAL HIGH (ref <1.2)
Total Protein: 8.7 g/dL — ABNORMAL HIGH (ref 6.5–8.1)
Total Protein: 8.3 g/dL — ABNORMAL HIGH (ref 6.5–8.1)

## 2023-06-21 LAB — URINALYSIS, ROUTINE W REFLEX MICROSCOPIC
Bacteria, UA: NONE SEEN
Bilirubin Urine: NEGATIVE
Glucose, UA: >=500 mg/dL — AB
Ketones, ur: 80 mg/dL — AB
Leukocytes,Ua: NEGATIVE
Nitrite: NEGATIVE
Protein, ur: 100 mg/dL — AB
Specific Gravity, Urine: 1.017 (ref 1.005–1.030)
pH: 6 (ref 5.0–8.0)

## 2023-06-21 LAB — BLOOD GAS, VENOUS
Acid-base deficit: 7.1 mmol/L — ABNORMAL HIGH (ref 0.0–2.0)
Bicarbonate: 16.8 mmol/L — ABNORMAL LOW (ref 20.0–28.0)
Drawn by: 1448
O2 Saturation: 89.6 %
Patient temperature: 37
pCO2, Ven: 29 mm[Hg] — ABNORMAL LOW (ref 44–60)
pH, Ven: 7.37 (ref 7.25–7.43)
pO2, Ven: 56 mm[Hg] — ABNORMAL HIGH (ref 32–45)

## 2023-06-21 LAB — CBC
HCT: 42.8 % (ref 36.0–46.0)
Hemoglobin: 14.1 g/dL (ref 12.0–15.0)
MCH: 27.4 pg (ref 26.0–34.0)
MCHC: 32.9 g/dL (ref 30.0–36.0)
MCV: 83.1 fL (ref 80.0–100.0)
Platelets: 246 10*3/uL (ref 150–400)
RBC: 5.15 MIL/uL — ABNORMAL HIGH (ref 3.87–5.11)
RDW: 13.2 % (ref 11.5–15.5)
WBC: 5.9 10*3/uL (ref 4.0–10.5)
nRBC: 0 % (ref 0.0–0.2)

## 2023-06-21 LAB — BASIC METABOLIC PANEL
Anion gap: 17 — ABNORMAL HIGH (ref 5–15)
BUN: 21 mg/dL — ABNORMAL HIGH (ref 6–20)
CO2: 15 mmol/L — ABNORMAL LOW (ref 22–32)
Calcium: 9.7 mg/dL (ref 8.9–10.3)
Chloride: 103 mmol/L (ref 98–111)
Creatinine, Ser: 1.61 mg/dL — ABNORMAL HIGH (ref 0.44–1.00)
GFR, Estimated: 37 mL/min — ABNORMAL LOW (ref >60)
Glucose, Bld: 380 mg/dL — ABNORMAL HIGH (ref 70–99)
Potassium: 4.2 mmol/L (ref 3.5–5.1)
Sodium: 135 mmol/L (ref 135–145)

## 2023-06-21 LAB — CBG MONITORING, ED
Glucose-Capillary: 310 mg/dL — ABNORMAL HIGH (ref 70–99)
Glucose-Capillary: 355 mg/dL — ABNORMAL HIGH (ref 70–99)
Glucose-Capillary: 272 mg/dL — ABNORMAL HIGH (ref 70–99)

## 2023-06-21 LAB — BETA-HYDROXYBUTYRIC ACID
Beta-Hydroxybutyric Acid: 3.61 mmol/L — ABNORMAL HIGH (ref 0.05–0.27)
Beta-Hydroxybutyric Acid: 3.85 mmol/L — ABNORMAL HIGH (ref 0.05–0.27)

## 2023-06-21 LAB — LIPASE, BLOOD: Lipase: 28 U/L (ref 11–51)

## 2023-06-21 LAB — SARS CORONAVIRUS 2 BY RT PCR: SARS Coronavirus 2 by RT PCR: NEGATIVE

## 2023-06-21 MED ORDER — HYDROMORPHONE HCL 2 MG PO TABS
1.0000 mg | ORAL_TABLET | Freq: Once | ORAL | Status: DC
  Filled 2023-06-21: qty 1

## 2023-06-21 MED ORDER — LACTATED RINGERS IV SOLN: INTRAVENOUS | Status: AC

## 2023-06-21 MED ORDER — ACETAMINOPHEN 650 MG RE SUPP: 650.0000 mg | Freq: Four times a day (QID) | RECTAL | Status: DC | PRN

## 2023-06-21 MED ORDER — METOCLOPRAMIDE HCL 10 MG PO TABS
5.0000 mg | ORAL_TABLET | Freq: Once | ORAL | Status: AC
  Administered 2023-06-21: 5 mg via ORAL
  Filled 2023-06-21: qty 1

## 2023-06-21 MED ORDER — FENTANYL CITRATE PF 50 MCG/ML IJ SOSY
PREFILLED_SYRINGE | INTRAMUSCULAR | Status: AC
  Filled 2023-06-21: qty 1

## 2023-06-21 MED ORDER — FENTANYL CITRATE PF 50 MCG/ML IJ SOSY
75.0000 ug | PREFILLED_SYRINGE | Freq: Once | INTRAMUSCULAR | Status: AC
  Administered 2023-06-21: 75 ug via INTRAVENOUS
  Filled 2023-06-21: qty 2

## 2023-06-21 MED ORDER — ENOXAPARIN SODIUM 40 MG/0.4ML IJ SOSY
40.0000 mg | PREFILLED_SYRINGE | Freq: Every day | INTRAMUSCULAR | Status: DC
  Administered 2023-06-22: 40 mg via SUBCUTANEOUS
  Filled 2023-06-21: qty 0.4

## 2023-06-21 MED ORDER — DEXTROSE IN LACTATED RINGERS 5 % IV SOLN: INTRAVENOUS | Status: AC

## 2023-06-21 MED ORDER — DEXTROSE 50 % IV SOLN: 0.0000 mL | INTRAVENOUS | Status: DC | PRN

## 2023-06-21 MED ORDER — LACTATED RINGERS IV BOLUS
1000.0000 mL | Freq: Once | INTRAVENOUS | Status: AC
  Administered 2023-06-21: 1000 mL via INTRAVENOUS

## 2023-06-21 MED ORDER — POTASSIUM CHLORIDE 10 MEQ/100ML IV SOLN
10.0000 meq | INTRAVENOUS | Status: AC
  Filled 2023-06-21: qty 100

## 2023-06-21 MED ORDER — INSULIN REGULAR(HUMAN) IN NACL 100-0.9 UT/100ML-% IV SOLN
INTRAVENOUS | Status: AC
Start: 2023-06-21 — End: 2023-06-22
  Administered 2023-06-21: 8.5 [IU]/h via INTRAVENOUS
  Filled 2023-06-21: qty 100

## 2023-06-21 MED ORDER — HYDROMORPHONE HCL 1 MG/ML IJ SOLN
1.0000 mg | Freq: Once | INTRAMUSCULAR | Status: AC
  Administered 2023-06-22: 1 mg via INTRAVENOUS
  Filled 2023-06-21: qty 1

## 2023-06-21 MED ORDER — ONDANSETRON 4 MG PO TBDP
4.0000 mg | ORAL_TABLET | Freq: Once | ORAL | Status: AC
  Administered 2023-06-21: 4 mg via ORAL
  Filled 2023-06-21: qty 1

## 2023-06-21 MED ORDER — ONDANSETRON HCL 4 MG/2ML IJ SOLN
4.0000 mg | Freq: Once | INTRAMUSCULAR | Status: AC
  Administered 2023-06-21: 4 mg via INTRAVENOUS
  Filled 2023-06-21: qty 2

## 2023-06-21 MED ORDER — ONDANSETRON HCL 4 MG/2ML IJ SOLN
INTRAMUSCULAR | Status: AC
  Administered 2023-06-21: 4 mg via INTRAVENOUS
  Filled 2023-06-21: qty 2

## 2023-06-21 MED ORDER — ACETAMINOPHEN 325 MG PO TABS
650.0000 mg | ORAL_TABLET | Freq: Four times a day (QID) | ORAL | Status: DC | PRN
Start: 2023-06-21 — End: 2023-06-22

## 2023-06-21 MED ORDER — LORAZEPAM 2 MG/ML IJ SOLN
0.5000 mg | Freq: Once | INTRAMUSCULAR | Status: AC
  Administered 2023-06-22: 0.5 mg via INTRAVENOUS
  Filled 2023-06-21: qty 1

## 2023-06-21 NOTE — ED Provider Notes (Signed)
[facility] AT [facility] Provider Note  HPI   [name] is a 57 y.o. female patient with a PMHx of CAD status post CABG, diabetes, hypertension, gastroparesis, recent hospital admission for DKA and abdominal pain who is here today with concern for abdominal pain.  Patient states that she has a history of gastroparesis, and she feels that she is having a flareup over the past day or 2.  Her symptoms started yesterday, she has had severe uncontrolled nausea and vomiting, and generalized abdominal pain as well.  She has had no major urinary symptoms no vaginal bleeding vaginal discharge her last bowel movement was a day or 2 ago she is still passing gas.   ROS Negative except as per HPI   Medical Decision Making   Upon presentation, the patient is afebrile, HDS, most recent systolics in the 150s she is globally tender in her abdomen and is actively spitting up but no active emesis  For this patient, prior to me seeing her, she already had some labs showing a glucose of 310, and metabolic panel glucose of 256 with a anion gap of borderline of 15, acidemia bicarbonate 19, creatinine 1.50  For this patient lipase was already negative at 28, no leukocytosis.  COVID and flu is negative.  This patient requires an abdominal CT scan given her severe abdominal pain, nausea vomiting, muscle still clinically concerned that she could have developing DKA as glucoses are high, I will add on a beta hydroxybutyrate and VBG a repeat metabolic panel and we are still waiting on a urine as well.  I have ordered pain medications fentanyl and Zofran, we are having difficulty obtaining IV access, IV team is going to come down helpless, if not I can place an ultrasound IV.  Clinical Course as of 06/21/23 2314  Fri Jun 21, 2023  1735 CAD status post CABG, diabetes, hypertension, gastroparesis, recent hospital admission for DKA and abdominal pain [JL]    Clinical Course User  Index [JL] Gunnar Bulla, MD     Ultrasound IV placed in the right upper arm, 20-gauge, she has very little no veins to go for in terms of target.  We repeated labs, bicarbonate low, worsening creatinine, anion gap now is present, concern for DKA, she had are received a fluid bolus, going to start her on DKA protocol Endo tool, insulin, beta hydroxybutyrate also came back elevated 3.85, will start on fluids,  Unfortunately still not got the CT scan of her abdomen pelvis, with the medicine team will follow up on.  She will be going to intermediate care unit, she has very poor access which I have communicated to the medicine team.  I am going to give her a small dose of Ativan IV to help her nausea and anxiety, as she has still been perpetually spitting up this entire time, Zofran is not helping at this point.  Hopefully this will help facilitate getting her CT scan to eval for any other intra-abdominal pathology.  She continues to be on insulin drip, I have given signout to the medicine team, and they will be mated this patient to the service   1. Diabetic ketoacidosis without coma associated with type 1 diabetes mellitus (HCC)     @DISPOSITION @  Rx / DC Orders ED Discharge Orders     None        Past Medical History:  Diagnosis Date   AKI (acute kidney injury) (HCC)    Anemia, iron deficiency  Anxiety and depression 05/18/2015   CAD (coronary artery disease), native coronary artery 01/11/2018   s/p CABG - Non-STEMI with severe three-vessel disease noted July 2019   Depression    Diabetic gastroparesis (HCC)    Diabetic neuropathy, type I diabetes mellitus (HCC) 05/18/2015   DKA, type 1 (HCC) 03/18/2016   Essential hypertension    Gastroparesis    GERD (gastroesophageal reflux disease)    HLD (hyperlipidemia)    MDD (major depressive disorder), single episode, severe , no psychosis (HCC)    Tardive dyskinesia    Vitamin B12 deficiency 08/16/2015   Past Surgical History:   Procedure Laterality Date   CARDIAC SURGERY     COLONOSCOPY  09/27/2014   at [facility]   CORONARY ARTERY BYPASS GRAFT N/A 01/14/2018   Procedure: CORONARY ARTERY BYPASS GRAFTING (CABG)X3, RIGHT AND LEFT SAPHENOUS VEIN HARVEST, MAMMARY TAKE DOWN. MAMMARY TO LAD, SVG TO PD, SVG TO DISTAL CIRC.;  Surgeon: Delight Ovens, MD;  Location: [facility] OR;  Service: Open Heart Surgery;  Laterality: N/A;   ESOPHAGOGASTRODUODENOSCOPY  09/27/2014   at [facility], Dr Vashti Hey. biospy neg for celiac, neg for H pylori.    EYE SURGERY     gailstones     INTRAMEDULLARY (IM) NAIL INTERTROCHANTERIC Right 05/10/2019   Procedure: INTRAMEDULLARY (IM) NAIL INTERTROCHANTRIC;  Surgeon: Lyndle Herrlich, MD;  Location: [facility] ORS;  Service: Orthopedics;  Laterality: Right;   IR FLUORO GUIDE CV LINE RIGHT  02/01/2017   IR FLUORO GUIDE CV LINE RIGHT  03/06/2017   IR FLUORO GUIDE CV LINE RIGHT  03/25/2017   IR GENERIC HISTORICAL  01/24/2016   IR FLUORO GUIDE CV LINE RIGHT 01/24/2016 Darrell K Allred, PA-C [facility]-INTER[facility] RAD   IR GENERIC HISTORICAL  01/24/2016   IR US GUIDE VASC ACCESS RIGHT 01/24/2016 Darrell K Allred, PA-C [facility]   IR US GUIDE VASC ACCESS RIGHT  02/01/2017   IR US GUIDE VASC ACCESS RIGHT  03/06/2017   IR US GUIDE VASC ACCESS RIGHT  03/25/2017   IR US GUIDE VASC ACCESS RIGHT  01/20/2019   IR VENIPUNCTURE 68YRS/OLDER BY MD  01/20/2019   LEFT HEART CATH AND CORONARY ANGIOGRAPHY N/A 01/07/2018   Procedure: LEFT HEART CATH AND CORONARY ANGIOGRAPHY;  Surgeon: Lennette Bihari, MD;  Location: [facility] INVASIVE CV LAB;  Service: Cardiovascular;  Laterality: N/A;   POSTERIOR VITRECTOMY AND MEMBRANE PEEL-LEFT EYE  09/28/2002   POSTERIOR VITRECTOMY AND MEMBRANE PEEL-RIGHT EYE  03/16/2002   RETINAL DETACHMENT SURGERY     TEE WITHOUT CARDIOVERSION N/A 01/14/2018   Procedure: TRANSESOPHAGEAL ECHOCARDIOGRAM (TEE);  Surgeon: Delight Ovens, MD;  Location: [facility] OR;  Service: Open Heart Surgery;  Laterality: N/A;   Family History  Problem Relation  Age of Onset   Cystic fibrosis Mother    Hypertension Father    Diabetes Brother    Hypertension Maternal Grandmother    Social History   Socioeconomic History   Marital status: Single    Spouse name: Not on file   Number of children: 1   Years of education: Not on file   Highest education level: Not on file  Occupational History   Occupation: Unemployed  Tobacco Use   Smoking status: Never   Smokeless tobacco: Never  Vaping Use   Vaping status: Never Used  Substance and Sexual Activity   Alcohol use: No   Drug use: Yes    Types: Marijuana    Comment: occ   Sexual activity: Yes    Partners: Male  Birth control/protection: None  Other Topics Concern   Not on file  Social History Narrative   Not on file   Social Drivers of Health   Financial Resource Strain: Low Risk  (12/26/2022)   Overall Financial Resource Strain (CARDIA)    Difficulty of Paying Living Expenses: Not hard at all  Food Insecurity: No Food Insecurity (05/24/2023)   Hunger Vital Sign    Worried About Running Out of Food in the Last Year: Never true    Ran Out of Food in the Last Year: Never true  Transportation Needs: No Transportation Needs (05/24/2023)   PRAPARE - Administrator, Civil Service (Medical): No    Lack of Transportation (Non-Medical): No  Physical Activity: Inactive (12/26/2022)   Exercise Vital Sign    Days of Exercise per Week: 0 days    Minutes of Exercise per Session: 0 min  Stress: No Stress Concern Present (12/26/2022)   [employer] of Occupational Health - Occupational Stress Questionnaire    Feeling of Stress : Only a little  Social Connections: Moderately Isolated (12/26/2022)   Social Connection and Isolation Panel [NHANES]    Frequency of Communication with Friends and Family: More than three times a week    Frequency of Social Gatherings with Friends and Family: Three times a week    Attends Religious Services: 1 to 4 times per year    Active Member of  Clubs or Organizations: No    Attends Banker Meetings: Never    Marital Status: Never married  Intimate Partner Violence: Not At Risk (05/24/2023)   Humiliation, Afraid, Rape, and Kick questionnaire    Fear of Current or Ex-Partner: No    Emotionally Abused: No    Physically Abused: No    Sexually Abused: No     Physical Exam   Vitals:   06/21/23 1114 06/21/23 1550 06/21/23 2007 06/21/23 2309  BP: (!) 169/88 (!) 157/74 (!) 174/82 (!) 182/72  Pulse: 96 99 96   Resp: 19 18 17 18   Temp: 98.2 F (36.8 C) 98.5 F (36.9 C) 98.6 F (37 C)   TempSrc: Oral Oral Oral   SpO2: 100% 100% 100% 100%    Physical Exam Vitals and nursing note reviewed.  Constitutional:      General: She is not in acute distress.    Appearance: She is well-developed.     Comments: Patient appears uncomfortable, actively having intermittent gagging and spitting up into blue emesis bag.  No active vomiting at this point  HENT:     Head: Normocephalic and atraumatic.     Right Ear: External ear normal.     Left Ear: External ear normal.     Mouth/Throat:     Mouth: Mucous membranes are moist.  Eyes:     Conjunctiva/sclera: Conjunctivae normal.  Cardiovascular:     Rate and Rhythm: Normal rate and regular rhythm.     Heart sounds: No murmur heard. Pulmonary:     Effort: Pulmonary effort is normal. No respiratory distress.     Breath sounds: Normal breath sounds.  Abdominal:     Palpations: Abdomen is soft.     Tenderness: There is abdominal tenderness (Diffuse mild to moderate abdominal tenderness, no focal tenderness that is worse than other places).  Musculoskeletal:        General: No swelling.     Cervical back: Neck supple.  Skin:    General: Skin is warm and dry.     Capillary Refill:  Capillary refill takes less than 2 seconds.  Neurological:     General: No focal deficit present.     Mental Status: She is oriented to person, place, and time.  Psychiatric:     Comments: tearful       Procedures   If procedures were preformed on this patient, they are listed below:  Procedures  The patient was seen, evaluated, and treated in conjunction with the attending physician, who voiced agreement in the care provided.  Note generated using Dragon voice dictation software and may contain dictation errors. Please contact me for any clarification or with any questions.   Electronically signed by:  Osvaldo Shipper, M.D. (PGY-2)    Gunnar Bulla, MD 06/21/23 7829    Pricilla Loveless, MD 06/22/23 [phone]

## 2023-06-21 NOTE — ED Provider Triage Note (Signed)
Emergency Medicine Provider Triage Evaluation Note  [name] , a 57 y.o. female  was evaluated in triage.  Pt complains of nausea, vomiting, abdominal pain.  Review of Systems  Positive:  Negative:   Physical Exam  BP (!) 169/88   Pulse 96   Temp 98.2 F (36.8 C) (Oral)   Resp 19   SpO2 100%  Gen:   Awake, no distress   Resp:  Normal effort  MSK:   Moves extremities without difficulty  Other:    Medical Decision Making  Medically screening exam initiated at 12:07 PM.  Appropriate orders placed.  [name] was informed that the remainder of the evaluation will be completed by another provider, this initial triage assessment does not replace that evaluation, and the importance of remaining in the ED until their evaluation is complete.  Nausea, vomiting, upper/middle/right abdominal pain. Patient stating that it feels like her gastroparesis. Stating that she got dilaudid last time which helped. Thinks that Reglan helps as well.  Denies fever, diarrhea, hematemesis.   Dorthy Cooler, [place] 06/21/23 1209

## 2023-06-21 NOTE — ED Notes (Signed)
insulin was started at 2136, for some reason the computer documented it as incomplete and won't allow me to change to accurate time

## 2023-06-21 NOTE — ED Triage Notes (Signed)
Pt here from home with c/o and  pain along with some n/v that started yesterday

## 2023-06-21 NOTE — Progress Notes (Signed)
IV attempt unsuccessful, veins via ultrasound not large enough for access. Provider stated central line will be placed. Primary nurse aware.

## 2023-06-21 NOTE — ED Notes (Signed)
CT delayed due to Pts increased nausea

## 2023-06-21 NOTE — Hospital Course (Signed)
Diabetic ketoacidosis, resolved Type 1 diabetes mellitus, poorly controlled She presented to the emergency department with acute on chronic abdominal pain and nausea and vomiting for 2 days.  She was found to be hyperglycemic with an anion gap metabolic acidosis and a mild elevation of beta hydroxybutyric acid, however her pH was 7.37.  She was started on IV insulin and quickly closed her anion gap with improvement of her clinical condition.  She continued to experience some abdominal pain and nausea, however this appears to be her baseline in the setting of her diabetic gastroparesis. She states that she has been taking her insulin regularly, however reports missing a dose of her long-acting insulin while she was waiting in the emergency department.  With her DKA resolved, we restarted her on her home dose of long-acting insulin and mealtime insulin, and instructed her to increase her long-acting insulin by 1 unit twice daily if her fasting blood glucose is greater than 140. Her blood glucose has been well-controlled on this regimen.   Chronic Abdominal pain History of colonic wall thickening  CT of her abdomen redemonstrated a thickening of her ascending colon with a possible transition point concerning for infectious versus inflammatory colitis.  She denies any other symptoms consistent with colitis including fever, diarrhea, or hematochezia.  Given her her persistent abdominal pain, we consulted gastroenterology.  They evaluated her and recommended a colonoscopy, however she deferred this and preferred to complete the procedure outpatient. We treated her for infectious colitis with 1 gram of azithromycin prior to discharge, but we are more suspicious of an inflammatory etiology. She states she plans to follow up outpatient with her gastroenterologist to schedule a colonoscopy.

## 2023-06-22 ENCOUNTER — Observation Stay (HOSPITAL_COMMUNITY): Payer: Medicare HMO

## 2023-06-22 ENCOUNTER — Other Ambulatory Visit: Payer: Self-pay

## 2023-06-22 DIAGNOSIS — R933 Abnormal findings on diagnostic imaging of other parts of digestive tract: Secondary | ICD-10-CM

## 2023-06-22 DIAGNOSIS — K449 Diaphragmatic hernia without obstruction or gangrene: Secondary | ICD-10-CM | POA: Diagnosis not present

## 2023-06-22 DIAGNOSIS — E1010 Type 1 diabetes mellitus with ketoacidosis without coma: Secondary | ICD-10-CM

## 2023-06-22 DIAGNOSIS — K529 Noninfective gastroenteritis and colitis, unspecified: Secondary | ICD-10-CM

## 2023-06-22 DIAGNOSIS — K429 Umbilical hernia without obstruction or gangrene: Secondary | ICD-10-CM | POA: Diagnosis not present

## 2023-06-22 DIAGNOSIS — K5900 Constipation, unspecified: Secondary | ICD-10-CM | POA: Diagnosis not present

## 2023-06-22 DIAGNOSIS — R112 Nausea with vomiting, unspecified: Secondary | ICD-10-CM | POA: Diagnosis not present

## 2023-06-22 DIAGNOSIS — R109 Unspecified abdominal pain: Secondary | ICD-10-CM | POA: Insufficient documentation

## 2023-06-22 LAB — BASIC METABOLIC PANEL
Anion gap: 13 (ref 5–15)
Anion gap: 10 (ref 5–15)
Anion gap: 8 (ref 5–15)
Anion gap: 10 (ref 5–15)
BUN: 20 mg/dL (ref 6–20)
BUN: 17 mg/dL (ref 6–20)
BUN: 19 mg/dL (ref 6–20)
BUN: 19 mg/dL (ref 6–20)
CO2: 18 mmol/L — ABNORMAL LOW (ref 22–32)
CO2: 21 mmol/L — ABNORMAL LOW (ref 22–32)
CO2: 23 mmol/L (ref 22–32)
CO2: 20 mmol/L — ABNORMAL LOW (ref 22–32)
Calcium: 9.5 mg/dL (ref 8.9–10.3)
Calcium: 8.2 mg/dL — ABNORMAL LOW (ref 8.9–10.3)
Calcium: 9 mg/dL (ref 8.9–10.3)
Calcium: 9.1 mg/dL (ref 8.9–10.3)
Chloride: 107 mmol/L (ref 98–111)
Chloride: 110 mmol/L (ref 98–111)
Chloride: 109 mmol/L (ref 98–111)
Chloride: 107 mmol/L (ref 98–111)
Creatinine, Ser: 1.48 mg/dL — ABNORMAL HIGH (ref 0.44–1.00)
Creatinine, Ser: 1.34 mg/dL — ABNORMAL HIGH (ref 0.44–1.00)
Creatinine, Ser: 1.41 mg/dL — ABNORMAL HIGH (ref 0.44–1.00)
Creatinine, Ser: 1.47 mg/dL — ABNORMAL HIGH (ref 0.44–1.00)
GFR, Estimated: 41 mL/min — ABNORMAL LOW (ref >60)
GFR, Estimated: 47 mL/min — ABNORMAL LOW (ref >60)
GFR, Estimated: 44 mL/min — ABNORMAL LOW (ref >60)
GFR, Estimated: 42 mL/min — ABNORMAL LOW (ref >60)
Glucose, Bld: 205 mg/dL — ABNORMAL HIGH (ref 70–99)
Glucose, Bld: 186 mg/dL — ABNORMAL HIGH (ref 70–99)
Glucose, Bld: 129 mg/dL — ABNORMAL HIGH (ref 70–99)
Glucose, Bld: 263 mg/dL — ABNORMAL HIGH (ref 70–99)
Potassium: 3.8 mmol/L (ref 3.5–5.1)
Potassium: 3.3 mmol/L — ABNORMAL LOW (ref 3.5–5.1)
Potassium: 3.8 mmol/L (ref 3.5–5.1)
Potassium: 4.1 mmol/L (ref 3.5–5.1)
Sodium: 138 mmol/L (ref 135–145)
Sodium: 141 mmol/L (ref 135–145)
Sodium: 140 mmol/L (ref 135–145)
Sodium: 137 mmol/L (ref 135–145)

## 2023-06-22 LAB — CBG MONITORING, ED
Glucose-Capillary: 143 mg/dL — ABNORMAL HIGH (ref 70–99)
Glucose-Capillary: 156 mg/dL — ABNORMAL HIGH (ref 70–99)
Glucose-Capillary: 166 mg/dL — ABNORMAL HIGH (ref 70–99)
Glucose-Capillary: 173 mg/dL — ABNORMAL HIGH (ref 70–99)
Glucose-Capillary: 175 mg/dL — ABNORMAL HIGH (ref 70–99)
Glucose-Capillary: 184 mg/dL — ABNORMAL HIGH (ref 70–99)
Glucose-Capillary: 215 mg/dL — ABNORMAL HIGH (ref 70–99)

## 2023-06-22 LAB — SEDIMENTATION RATE: Sed Rate: 30 mm/h — ABNORMAL HIGH (ref 0–22)

## 2023-06-22 LAB — GLUCOSE, CAPILLARY
Glucose-Capillary: 253 mg/dL — ABNORMAL HIGH (ref 70–99)
Glucose-Capillary: 171 mg/dL — ABNORMAL HIGH (ref 70–99)

## 2023-06-22 LAB — BETA-HYDROXYBUTYRIC ACID
Beta-Hydroxybutyric Acid: 0.09 mmol/L (ref 0.05–0.27)
Beta-Hydroxybutyric Acid: 0.66 mmol/L — ABNORMAL HIGH (ref 0.05–0.27)

## 2023-06-22 LAB — BILIRUBIN, FRACTIONATED(TOT/DIR/INDIR)
Bilirubin, Direct: 0.2 mg/dL (ref 0.0–0.2)
Indirect Bilirubin: 0.9 mg/dL (ref 0.3–0.9)
Total Bilirubin: 1.1 mg/dL (ref <1.2)

## 2023-06-22 LAB — TROPONIN I (HIGH SENSITIVITY): Troponin I (High Sensitivity): 8 ng/L (ref <18)

## 2023-06-22 LAB — C-REACTIVE PROTEIN: CRP: 1.2 mg/dL — ABNORMAL HIGH (ref <1.0)

## 2023-06-22 MED ORDER — INSULIN GLARGINE-YFGN 100 UNIT/ML ~~LOC~~ SOLN
5.0000 [IU] | Freq: Once | SUBCUTANEOUS | Status: AC
  Administered 2023-06-22: 5 [IU] via SUBCUTANEOUS
  Filled 2023-06-22: qty 0.05

## 2023-06-22 MED ORDER — ACETAMINOPHEN 500 MG PO TABS
1000.0000 mg | ORAL_TABLET | Freq: Three times a day (TID) | ORAL | Status: DC
  Administered 2023-06-22 – 2023-06-23 (×3): 1000 mg via ORAL
  Filled 2023-06-22 (×3): qty 2

## 2023-06-22 MED ORDER — INSULIN GLARGINE-YFGN 100 UNIT/ML ~~LOC~~ SOLN
10.0000 [IU] | Freq: Once | SUBCUTANEOUS | Status: AC
Start: 2023-06-22 — End: 2023-06-22
  Administered 2023-06-22: 10 [IU] via SUBCUTANEOUS
  Filled 2023-06-22: qty 0.1

## 2023-06-22 MED ORDER — SENNOSIDES-DOCUSATE SODIUM 8.6-50 MG PO TABS
1.0000 | ORAL_TABLET | Freq: Every day | ORAL | Status: DC
  Administered 2023-06-22 (×2): 1 via ORAL
  Filled 2023-06-22 (×2): qty 1

## 2023-06-22 MED ORDER — METOPROLOL TARTRATE 25 MG PO TABS
25.0000 mg | ORAL_TABLET | Freq: Two times a day (BID) | ORAL | Status: DC
  Administered 2023-06-22 – 2023-06-23 (×4): 25 mg via ORAL
  Filled 2023-06-22 (×4): qty 1

## 2023-06-22 MED ORDER — POTASSIUM CHLORIDE CRYS ER 20 MEQ PO TBCR
40.0000 meq | EXTENDED_RELEASE_TABLET | Freq: Once | ORAL | Status: AC
  Administered 2023-06-22: 40 meq via ORAL
  Filled 2023-06-22: qty 2

## 2023-06-22 MED ORDER — IOHEXOL 350 MG/ML SOLN
60.0000 mL | Freq: Once | INTRAVENOUS | Status: AC | PRN
  Administered 2023-06-22: 60 mL via INTRAVENOUS

## 2023-06-22 MED ORDER — ROSUVASTATIN CALCIUM 20 MG PO TABS
20.0000 mg | ORAL_TABLET | Freq: Every day | ORAL | Status: DC
  Administered 2023-06-22 – 2023-06-23 (×2): 20 mg via ORAL
  Filled 2023-06-22 (×2): qty 1

## 2023-06-22 MED ORDER — INSULIN ASPART 100 UNIT/ML IJ SOLN
0.0000 [IU] | Freq: Three times a day (TID) | INTRAMUSCULAR | Status: DC
  Administered 2023-06-22 – 2023-06-23 (×2): 8 [IU] via SUBCUTANEOUS
  Administered 2023-06-23: 5 [IU] via SUBCUTANEOUS

## 2023-06-22 MED ORDER — HYDROMORPHONE HCL 1 MG/ML IJ SOLN
0.5000 mg | INTRAMUSCULAR | Status: DC | PRN
  Administered 2023-06-22: 0.5 mg via INTRAVENOUS
  Filled 2023-06-22: qty 1

## 2023-06-22 MED ORDER — PREGABALIN 75 MG PO CAPS
75.0000 mg | ORAL_CAPSULE | Freq: Every day | ORAL | Status: DC
  Administered 2023-06-22 – 2023-06-23 (×2): 75 mg via ORAL
  Filled 2023-06-22: qty 1
  Filled 2023-06-22: qty 3

## 2023-06-22 MED ORDER — POLYETHYLENE GLYCOL 3350 17 G PO PACK
17.0000 g | PACK | Freq: Two times a day (BID) | ORAL | Status: DC
  Administered 2023-06-22 – 2023-06-23 (×2): 17 g via ORAL
  Filled 2023-06-22 (×2): qty 1

## 2023-06-22 NOTE — ED Notes (Signed)
Unsuccessful venipuncture x1

## 2023-06-22 NOTE — Progress Notes (Signed)
New Admission Note:   Arrival Method: stretcher Mental Orientation: aa+ox4 Telemetry: N/A Assessment: Completed Skin: stage II bil. heels IV: left forearm and right upper arm NSL Pain: denies Tubes: N/A Safety Measures: Safety Fall Prevention Plan has been given, discussed and signed Admission: Completed [facility] Orientation: Patient has been orientated to the room, unit and staff.  Family: not present  Orders have been reviewed and implemented. Will continue to monitor the patient. Call light has been placed within reach and bed alarm has been activated.   Margarita Grizzle, RN

## 2023-06-22 NOTE — Plan of Care (Signed)

## 2023-06-22 NOTE — Consult Note (Signed)
Consultation  Referring Provider: Medicine service /[place] Primary Care Physician:  Hoy Register, MD Primary Gastroenterologist:  [facility]  Reason for Consultation: Abnormal CT abdomen  HPI: [name] is a 57 y.o. female, insulin-dependent diabetic with history of gastroparesis, history of coronary artery disease status post prior CABG, hypertension and recent admission for DKA who presented back to the emergency room yesterday with abdominal pain which she describes as feeling like her gastroparesis pain This was associated with uncontrolled nausea and vomiting over the prior 2 days. Workup in the ER consistent with DKA, and she has been treated for that with improvement in symptoms. Diet has been advanced to solid diet today and she says that she is still nauseated she was okay has been able to keep down food.  She is still having upper abdominal discomfort again which she attributes to the gastroparesis. She has previously been tried on metoclopramide for gastroparesis but had tardive dyskinesia, she does have GI care through [facility] and has been followed there fairly regularly.  She has been given Motegrity for gastroparesis but says she did not stay on that because it caused her to have loose stools and urgency.  Most recent  she has not been on anything for gastroparesis. She also has history of chronic constipation and uses Colace for that. She has not had prior colonoscopy that is mentioned in her GI notes to be scheduled in the future.  Because of the abdominal pain she underwent CT of the abdomen pelvis with contrast yesterday shows a small hiatal hernia, under distended stomach, patulous proximal jejunum and persistent wall thickening of the ascending colon with a questionable transition point, there is mild to moderate colonic stool distally.  Wall thickening felt secondary to underdistention versus colitis or infiltrating disease.  She also  had a CT of the abdomen pelvis done on 05/23/2023 when she had presented with abdominal pain with similar findings in the ascending colon.  Patient says that she has not been having any pain in the right side of her abdomen, she has not noticed any changes in her bowel habits, nor any melena or hematochezia.  Labs on arrival yesterday-WBC 5.9/hemoglobin 14.1/hematocrit 42.8/platelets 246 Sodium 135/potassium 4.5/CO2 17/glucose 373 T. bili 1.9/LFTs otherwise unremarkable Beta hydroxybutyric acid elevated at 3.61  Today T. bili 1.1/direct bili 0.2/indirect 0.9.     Past Medical History:  Diagnosis Date   AKI (acute kidney injury) (HCC)    Anemia, iron deficiency    Anxiety and depression 05/18/2015   CAD (coronary artery disease), native coronary artery 01/11/2018   s/p CABG - Non-STEMI with severe three-vessel disease noted July 2019   Depression    Diabetic gastroparesis (HCC)    Diabetic neuropathy, type I diabetes mellitus (HCC) 05/18/2015   DKA, type 1 (HCC) 03/18/2016   Essential hypertension    Gastroparesis    GERD (gastroesophageal reflux disease)    HLD (hyperlipidemia)    MDD (major depressive disorder), single episode, severe , no psychosis (HCC)    Tardive dyskinesia    Vitamin B12 deficiency 08/16/2015    Past Surgical History:  Procedure Laterality Date   CARDIAC SURGERY     COLONOSCOPY  09/27/2014   at [facility]   CORONARY ARTERY BYPASS GRAFT N/A 01/14/2018   Procedure: CORONARY ARTERY BYPASS GRAFTING (CABG)X3, RIGHT AND LEFT SAPHENOUS VEIN HARVEST, MAMMARY TAKE DOWN. MAMMARY TO LAD, SVG TO PD, SVG TO DISTAL CIRC.;  Surgeon: Delight Ovens, MD;  Location: [facility] OR;  Service: Open  Heart Surgery;  Laterality: N/A;   ESOPHAGOGASTRODUODENOSCOPY  09/27/2014   at [facility], Dr Vashti Hey. biospy neg for celiac, neg for H pylori.    EYE SURGERY     gailstones     INTRAMEDULLARY (IM) NAIL INTERTROCHANTERIC Right 05/10/2019   Procedure: INTRAMEDULLARY (IM) NAIL  INTERTROCHANTRIC;  Surgeon: Lyndle Herrlich, MD;  Location: [facility] ORS;  Service: Orthopedics;  Laterality: Right;   IR FLUORO GUIDE CV LINE RIGHT  02/01/2017   IR FLUORO GUIDE CV LINE RIGHT  03/06/2017   IR FLUORO GUIDE CV LINE RIGHT  03/25/2017   IR GENERIC HISTORICAL  01/24/2016   IR FLUORO GUIDE CV LINE RIGHT 01/24/2016 Darrell K Allred, PA-C [facility]   IR GENERIC HISTORICAL  01/24/2016   IR US GUIDE VASC ACCESS RIGHT 01/24/2016 Darrell K Allred, PA-C [facility]   IR US GUIDE VASC ACCESS RIGHT  02/01/2017   IR US GUIDE VASC ACCESS RIGHT  03/06/2017   IR US GUIDE VASC ACCESS RIGHT  03/25/2017   IR US GUIDE VASC ACCESS RIGHT  01/20/2019   IR VENIPUNCTURE 72YRS/OLDER BY MD  01/20/2019   LEFT HEART CATH AND CORONARY ANGIOGRAPHY N/A 01/07/2018   Procedure: LEFT HEART CATH AND CORONARY ANGIOGRAPHY;  Surgeon: Lennette Bihari, MD;  Location: [facility] INVASIVE CV LAB;  Service: Cardiovascular;  Laterality: N/A;   POSTERIOR VITRECTOMY AND MEMBRANE PEEL-LEFT EYE  09/28/2002   POSTERIOR VITRECTOMY AND MEMBRANE PEEL-RIGHT EYE  03/16/2002   RETINAL DETACHMENT SURGERY     TEE WITHOUT CARDIOVERSION N/A 01/14/2018   Procedure: TRANSESOPHAGEAL ECHOCARDIOGRAM (TEE);  Surgeon: Delight Ovens, MD;  Location: [facility] OR;  Service: Open Heart Surgery;  Laterality: N/A;    Prior to Admission medications   Medication Sig Start Date End Date Taking? Authorizing Provider  acetaminophen (TYLENOL) 500 MG tablet Take 1,000 mg by mouth at bedtime as needed for mild pain.   Yes [provider]  ammonium lactate (AMLACTIN) 12 % cream Apply 1 Application topically as needed for dry skin. 01/24/23  Yes McCaughan, Dia D, DPM  aspirin EC 81 MG tablet Take 1 tablet (81 mg total) by mouth daily. 06/03/18  Yes Nahser, Deloris Ping, MD  furosemide (LASIX) 40 MG tablet Take 1 tablet (40 mg total) by mouth daily. 05/27/23  Yes Nahser, Deloris Ping, MD  insulin aspart (NOVOLOG) 100 UNIT/ML injection 4-8 units three times a day 15 min before  meals Patient taking differently: Inject 4-12 Units into the skin 3 (three) times daily with meals. 02/14/23  Yes Motwani, Carin Hock, MD  insulin glargine (LANTUS) 100 UNIT/ML injection Lantus 9 units bid 05/20/23  Yes Thapa, [place], MD  losartan (COZAAR) 25 MG tablet Take 1 tablet (25 mg total) by mouth daily. Hold this medication if your systolic blood pressure ( top number) s less than 100. Please call your doctor is your systolic blood pressure is less than 100 05/28/23  Yes Nahser, Deloris Ping, MD  metoprolol tartrate (LOPRESSOR) 25 MG tablet Take 1 tablet (25 mg total) by mouth 2 (two) times daily. Patient taking differently: Take 12.5 mg by mouth 2 (two) times daily. 05/27/23  Yes Nahser, Deloris Ping, MD  ondansetron (ZOFRAN) 4 MG tablet Take 1 tablet (4 mg total) by mouth every 8 (eight) hours as needed for nausea or vomiting. 10/31/22  Yes Hoy Register, MD  oxyCODONE-acetaminophen (PERCOCET) 10-325 MG tablet Take 1 tablet by mouth 3 (three) times daily as needed. 06/15/23  Yes [provider]  polyethylene glycol (MIRALAX / GLYCOLAX) 17  g packet Take 17 g by mouth 2 (two) times daily. Patient taking differently: Take 17 g by mouth daily as needed. 05/26/23  Yes Tawkaliyar, Roya, DO  pregabalin (LYRICA) 75 MG capsule Take 1 capsule (75 mg total) by mouth daily. Patient taking differently: Take 75 mg by mouth 2 (two) times daily. 05/26/23 06/25/23 Yes Tawkaliyar, Roya, DO  rosuvastatin (CRESTOR) 20 MG tablet Take 1 tablet (20 mg total) by mouth daily. 05/27/23  Yes Nahser, Deloris Ping, MD    Current Facility-Administered Medications  Medication Dose Route Frequency Provider Last Rate Last Admin   acetaminophen (TYLENOL) tablet 1,000 mg  1,000 mg Oral Q8H Gomez-Caraballo, Byrd Hesselbach, MD   1,000 mg at 06/22/23 0651   dextrose 50 % solution 0-50 mL  0-50 mL Intravenous PRN Morene Crocker, MD       enoxaparin (LOVENOX) injection 40 mg  40 mg Subcutaneous Daily Morene Crocker, MD   40 mg at  06/22/23 0865   HYDROmorphone (DILAUDID) injection 0.5 mg  0.5 mg Intravenous Q4H PRN Reymundo Poll, MD       metoprolol tartrate (LOPRESSOR) tablet 25 mg  25 mg Oral BID Morene Crocker, MD   25 mg at 06/22/23 7846   polyethylene glycol (MIRALAX / GLYCOLAX) packet 17 g  17 g Oral BID Morene Crocker, MD       pregabalin (LYRICA) capsule 75 mg  75 mg Oral Daily Morene Crocker, MD       rosuvastatin (CRESTOR) tablet 20 mg  20 mg Oral Daily Morene Crocker, MD       senna-docusate (Senokot-S) tablet 1 tablet  1 tablet Oral QHS Morene Crocker, MD   1 tablet at 06/22/23 0404   Current Outpatient Medications  Medication Sig Dispense Refill   acetaminophen (TYLENOL) 500 MG tablet Take 1,000 mg by mouth at bedtime as needed for mild pain.     ammonium lactate (AMLACTIN) 12 % cream Apply 1 Application topically as needed for dry skin. 385 g 2   aspirin EC 81 MG tablet Take 1 tablet (81 mg total) by mouth daily.     furosemide (LASIX) 40 MG tablet Take 1 tablet (40 mg total) by mouth daily. 15 tablet 0   insulin aspart (NOVOLOG) 100 UNIT/ML injection 4-8 units three times a day 15 min before meals (Patient taking differently: Inject 4-12 Units into the skin 3 (three) times daily with meals.) 10 mL 5   insulin glargine (LANTUS) 100 UNIT/ML injection Lantus 9 units bid 10 mL 1   losartan (COZAAR) 25 MG tablet Take 1 tablet (25 mg total) by mouth daily. Hold this medication if your systolic blood pressure ( top number) s less than 100. Please call your doctor is your systolic blood pressure is less than 100 15 tablet 0   metoprolol tartrate (LOPRESSOR) 25 MG tablet Take 1 tablet (25 mg total) by mouth 2 (two) times daily. (Patient taking differently: Take 12.5 mg by mouth 2 (two) times daily.) 15 tablet 0   ondansetron (ZOFRAN) 4 MG tablet Take 1 tablet (4 mg total) by mouth every 8 (eight) hours as needed for nausea or vomiting. 90 tablet 1   oxyCODONE-acetaminophen  (PERCOCET) 10-325 MG tablet Take 1 tablet by mouth 3 (three) times daily as needed.     polyethylene glycol (MIRALAX / GLYCOLAX) 17 g packet Take 17 g by mouth 2 (two) times daily. (Patient taking differently: Take 17 g by mouth daily as needed.) 14 each 0   pregabalin (LYRICA) 75 MG capsule Take  1 capsule (75 mg total) by mouth daily. (Patient taking differently: Take 75 mg by mouth 2 (two) times daily.) 30 capsule 0   rosuvastatin (CRESTOR) 20 MG tablet Take 1 tablet (20 mg total) by mouth daily. 15 tablet 0    Allergies as of 06/21/2023 - Review Complete 06/21/2023  Allergen Reaction Noted   Anesthetics, amide Nausea And Vomiting 03/21/2012   Chlorhexidine Other (See Comments) 05/10/2019   Penicillins Diarrhea, Nausea And Vomiting, and Other (See Comments) 10/20/2014   Enkaid [encainide] Nausea And Vomiting 02/08/2015   Subutex [buprenorphine] Rash     Family History  Problem Relation Age of Onset   Cystic fibrosis Mother    Hypertension Father    Diabetes Brother    Hypertension Maternal Grandmother     Social History   Socioeconomic History   Marital status: Single    Spouse name: Not on file   Number of children: 1   Years of education: Not on file   Highest education level: Not on file  Occupational History   Occupation: Unemployed  Tobacco Use   Smoking status: Never   Smokeless tobacco: Never  Vaping Use   Vaping status: Never Used  Substance and Sexual Activity   Alcohol use: No   Drug use: Yes    Types: Marijuana    Comment: occ   Sexual activity: Yes    Partners: Male    Birth control/protection: None  Other Topics Concern   Not on file  Social History Narrative   Not on file   Social Drivers of Health   Financial Resource Strain: Low Risk  (12/26/2022)   Overall Financial Resource Strain (CARDIA)    Difficulty of Paying Living Expenses: Not hard at all  Food Insecurity: No Food Insecurity (05/24/2023)   Hunger Vital Sign    Worried About Running  Out of Food in the Last Year: Never true    Ran Out of Food in the Last Year: Never true  Transportation Needs: No Transportation Needs (05/24/2023)   PRAPARE - Administrator, Civil Service (Medical): No    Lack of Transportation (Non-Medical): No  Physical Activity: Inactive (12/26/2022)   Exercise Vital Sign    Days of Exercise per Week: 0 days    Minutes of Exercise per Session: 0 min  Stress: No Stress Concern Present (12/26/2022)   [employer] of Occupational Health - Occupational Stress Questionnaire    Feeling of Stress : Only a little  Social Connections: Moderately Isolated (12/26/2022)   Social Connection and Isolation Panel [NHANES]    Frequency of Communication with Friends and Family: More than three times a week    Frequency of Social Gatherings with Friends and Family: Three times a week    Attends Religious Services: 1 to 4 times per year    Active Member of Clubs or Organizations: No    Attends Banker Meetings: Never    Marital Status: Never married  Intimate Partner Violence: Not At Risk (05/24/2023)   Humiliation, Afraid, Rape, and Kick questionnaire    Fear of Current or Ex-Partner: No    Emotionally Abused: No    Physically Abused: No    Sexually Abused: No    Review of Systems: Pertinent positive and negative review of systems were noted in the above HPI section.  All other review of systems was otherwise negative.   Physical Exam: Vital signs in last 24 hours: Temp:  [98 F (36.7 C)-98.6 F (37 C)] 98.4 F (  36.9 C) (12/28 0848) Pulse Rate:  [65-99] 65 (12/28 1100) Resp:  [11-18] 11 (12/28 1100) BP: (120-182)/(51-82) 135/67 (12/28 1100) SpO2:  [98 %-100 %] 98 % (12/28 1100) Last BM Date : 06/21/23 General:   Alert,  Well-developed, well-nourished, chronically ill-appearing older female pleasant and cooperative in NAD kilobit tachypnea Head:  Normocephalic and atraumatic. Eyes:  Sclera clear, no icterus.   Conjunctiva  pink. Ears:  Normal auditory acuity. Nose:  No deformity, discharge,  or lesions. Mouth:  No deformity or lesions.   Neck:  Supple; no masses or thyromegaly. Lungs:  Clear throughout to auscultation.   No wheezes, crackles, or rhonchi.  Heart:  Regular rate and rhythm; no murmurs, clicks, rubs,  or gallops. Abdomen:  Soft, obese, no focal tenderness, nondistended, no palpable mass or hepatosplenomegaly, bowel sounds are present Rectal: Not done today Msk:  Symmetrical without gross deformities. . Pulses:  Normal pulses noted. Extremities:  Without clubbing or edema. Neurologic:  Alert and  oriented x4;  grossly normal neurologically. Skin:  Intact without significant lesions or rashes.. Psych:  Alert and cooperative  Intake/Output from previous day: 12/27 0701 - 12/28 0700 In: 200 [P.O.:200] Out: -  Intake/Output this shift: Total I/O In: 269.7 [P.O.:250; I.V.:19.7] Out: -   Lab Results: Recent Labs    06/21/23 1153  WBC 5.9  HGB 14.1  HCT 42.8  PLT 246   BMET Recent Labs    06/22/23 0033 06/22/23 0552 06/22/23 0856  NA 138 141 140  K 3.8 3.3* 3.8  CL 107 110 109  CO2 18* 21* 23  GLUCOSE 205* 186* 129*  BUN 20 17 19   CREATININE 1.48* 1.34* 1.41*  CALCIUM 9.5 8.2* 9.0   LFT Recent Labs    06/21/23 1939 06/22/23 0033  PROT 8.3*  --   ALBUMIN 3.8  --   AST 28  --   ALT 23  --   ALKPHOS 107  --   BILITOT 1.9* 1.1  BILIDIR  --  0.2  IBILI  --  0.9   PT/INR No results for input(s): "LABPROT", "INR" in the last 72 hours. Hepatitis Panel No results for input(s): "HEPBSAG", "HCVAB", "HEPAIGM", "HEPBIGM" in the last 72 hours.   IMPRESSION:  #30 57 year old female with insulin-dependent diabetes mellitus admitted with nausea vomiting and abdominal pain x 2 days and found to be in DKA. She also had a recent admission for DKA about 1 month ago.  #2 history of diabetic gastroparesis-chronic not on any current therapy.  Developed tardive dyskinesia with  metoclopramide, most recently tried on Motegrity which caused diarrhea  #3 abnormal CT imaging of the ascending colon-this was also present on CT about a month ago-with wall thickening and questionable transition point-rule out inflammatory, infectious neoplasm, underdistention  Patient has not had any new complaints of abdominal pain other than that which she associates with her gastroparesis, no recent changes in bowel habits, no melena or hematochezia. No prior colonoscopy  #4 coronary artery disease status post CABG #5 chronic kidney disease  PLAN: Patient should have colonoscopy, she is not sure that she wants to pursue colonoscopy here while she is in the hospital and may opt to follow-up with her primary gastroenterologist at [facility]. Think she will tolerate a bowel prep today, will need to completely resolve her DKA symptoms and nausea prior to any attempt at bowel prep, and I think it would be reasonable for her to pursue colonoscopy on an outpatient basis. We will discuss with her further.  Darrelle Wiberg EsterwoodPA-C  06/22/2023, 1:04 PM

## 2023-06-22 NOTE — ED Notes (Signed)
MD paged to get orders to transition off endo tool per endo tool instructions. MD has not responded

## 2023-06-22 NOTE — ED Notes (Signed)
ED TO INPATIENT HANDOFF REPORT  ED Nurse Name and Phone #: Murlean Iba Paramedic [phone]  S Name/Age/Gender [name] 57 y.o. female Room/Bed: 024C/024C  Code Status   Code Status: Full Code  Home/SNF/Other Home Patient oriented to: self, place, time, and situation Is this baseline? Yes   Triage Complete: Triage complete  Chief Complaint Diabetic ketoacidosis (HCC) [E11.10]  Triage Note Pt here from home with c/o and  pain along with some n/v that started yesterday    Allergies Allergies  Allergen Reactions   Anesthetics, Amide Nausea And Vomiting   Chlorhexidine Other (See Comments)    Unknown reaction   Penicillins Diarrhea, Nausea And Vomiting and Other (See Comments)   Enkaid [Encainide] Nausea And Vomiting   Reglan [Metoclopramide] Swelling and Other (See Comments)    Dystonia Muscle rigidity Slurred speech Tongue swelling   Subutex [Buprenorphine] Rash    Level of Care/Admitting Diagnosis ED Disposition     ED Disposition  Admit   Condition  --   Comment  Hospital Area: [facility] [[id number]]  Level of Care: Progressive [102]  Admit to Progressive based on following criteria: GI, ENDOCRINE disease patients with GI bleeding, acute liver failure or pancreatitis, stable with diabetic ketoacidosis or thyrotoxicosis (hypothyroid) state.  May place patient in observation at [facility] or [facility] if equivalent level of care is available:: No  Covid Evaluation: Asymptomatic - no recent exposure (last 10 days) testing not required  Diagnosis: Diabetic ketoacidosis [facility]) [[id number]]  Admitting Physician: Reymundo Poll [[id number]]  Attending Physician: Reymundo Poll [[id number]]          B Medical/Surgery History Past Medical History:  Diagnosis Date   AKI (acute kidney injury) (HCC)    Anemia, iron deficiency    Anxiety and depression 05/18/2015   CAD (coronary artery disease), native coronary artery 01/11/2018   s/p CABG -  Non-STEMI with severe three-vessel disease noted July 2019   Depression    Diabetic gastroparesis (HCC)    Diabetic neuropathy, type I diabetes mellitus (HCC) 05/18/2015   DKA, type 1 (HCC) 03/18/2016   Essential hypertension    Gastroparesis    GERD (gastroesophageal reflux disease)    HLD (hyperlipidemia)    MDD (major depressive disorder), single episode, severe , no psychosis (HCC)    Tardive dyskinesia    Vitamin B12 deficiency 08/16/2015   Past Surgical History:  Procedure Laterality Date   CARDIAC SURGERY     COLONOSCOPY  09/27/2014   at [facility]   CORONARY ARTERY BYPASS GRAFT N/A 01/14/2018   Procedure: CORONARY ARTERY BYPASS GRAFTING (CABG)X3, RIGHT AND LEFT SAPHENOUS VEIN HARVEST, MAMMARY TAKE DOWN. MAMMARY TO LAD, SVG TO PD, SVG TO DISTAL CIRC.;  Surgeon: Delight Ovens, MD;  Location: [facility] OR;  Service: Open Heart Surgery;  Laterality: N/A;   ESOPHAGOGASTRODUODENOSCOPY  09/27/2014   at [facility], Dr Vashti Hey. biospy neg for celiac, neg for H pylori.    EYE SURGERY     gailstones     INTRAMEDULLARY (IM) NAIL INTERTROCHANTERIC Right 05/10/2019   Procedure: INTRAMEDULLARY (IM) NAIL INTERTROCHANTRIC;  Surgeon: Lyndle Herrlich, MD;  Location: [facility] ORS;  Service: Orthopedics;  Laterality: Right;   IR FLUORO GUIDE CV LINE RIGHT  02/01/2017   IR FLUORO GUIDE CV LINE RIGHT  03/06/2017   IR FLUORO GUIDE CV LINE RIGHT  03/25/2017   IR GENERIC HISTORICAL  01/24/2016   IR FLUORO GUIDE CV LINE RIGHT 01/24/2016 Darrell K Allred, PA-C [facility]-INTER[facility] RAD   IR GENERIC  HISTORICAL  01/24/2016   IR US GUIDE VASC ACCESS RIGHT 01/24/2016 Darrell K Allred, PA-C [facility]   IR US GUIDE VASC ACCESS RIGHT  02/01/2017   IR US GUIDE VASC ACCESS RIGHT  03/06/2017   IR US GUIDE VASC ACCESS RIGHT  03/25/2017   IR US GUIDE VASC ACCESS RIGHT  01/20/2019   IR VENIPUNCTURE 68YRS/OLDER BY MD  01/20/2019   LEFT HEART CATH AND CORONARY ANGIOGRAPHY N/A 01/07/2018   Procedure: LEFT HEART CATH AND CORONARY ANGIOGRAPHY;  Surgeon:  Lennette Bihari, MD;  Location: [facility] INVASIVE CV LAB;  Service: Cardiovascular;  Laterality: N/A;   POSTERIOR VITRECTOMY AND MEMBRANE PEEL-LEFT EYE  09/28/2002   POSTERIOR VITRECTOMY AND MEMBRANE PEEL-RIGHT EYE  03/16/2002   RETINAL DETACHMENT SURGERY     TEE WITHOUT CARDIOVERSION N/A 01/14/2018   Procedure: TRANSESOPHAGEAL ECHOCARDIOGRAM (TEE);  Surgeon: Delight Ovens, MD;  Location: [facility] OR;  Service: Open Heart Surgery;  Laterality: N/A;     A IV Location/Drains/Wounds Patient Lines/Drains/Airways Status     Active Line/Drains/Airways     Name Placement date Placement time Site Days   Peripheral IV 06/21/23 20 G Anterior;Distal;Right;Upper Arm 06/21/23  1941  Arm  1   Peripheral IV 06/22/23 20 G Anterior;Distal;Left Forearm 06/22/23  0024  Forearm  less than 1            Intake/Output Last 24 hours  Intake/Output Summary (Last 24 hours) at 06/22/2023 1435 Last data filed at 06/22/2023 1115 Gross per 24 hour  Intake 469.7 ml  Output --  Net 469.7 ml    Labs/Imaging Results for orders placed or performed during the hospital encounter of 06/21/23 (from the past 48 hours)  SARS Coronavirus 2 by RT PCR (hospital order, performed in [facility] hospital lab) *cepheid single result test* Anterior Nasal Swab     Status: None   Collection Time: 06/21/23 11:16 AM   Specimen: Anterior Nasal Swab  Result Value Ref Range   SARS Coronavirus 2 by RT PCR NEGATIVE NEGATIVE    Comment: Performed at [facility], [address] [place], [place] [address]  Lipase, blood     Status: None   Collection Time: 06/21/23 11:53 AM  Result Value Ref Range   Lipase 28 11 - 51 U/L    Comment: Performed at [facility], [address] [place], [place] [address]  Comprehensive metabolic panel     Status: Abnormal   Collection Time: 06/21/23 11:53 AM  Result Value Ref Range   Sodium 135 135 - 145 mmol/L   Potassium 4.5 3.5 - 5.1 mmol/L   Chloride 101 98 - 111 mmol/L   CO2 19 (L) 22 -  32 mmol/L   Glucose, Bld 256 (H) 70 - 99 mg/dL    Comment: Glucose reference range applies only to samples taken after fasting for at least 8 hours.   BUN 18 6 - 20 mg/dL   Creatinine, Ser 0.98 (H) 0.44 - 1.00 mg/dL   Calcium 9.8 8.9 - 11.9 mg/dL   Total Protein 8.7 (H) 6.5 - 8.1 g/dL   Albumin 4.0 3.5 - 5.0 g/dL   AST 30 15 - 41 U/L   ALT 24 0 - 44 U/L   Alkaline Phosphatase 123 38 - 126 U/L   Total Bilirubin 1.6 (H) <1.2 mg/dL   GFR, Estimated 41 (L) >60 mL/min    Comment: (NOTE) Calculated using the CKD-EPI Creatinine Equation (2021)    Anion gap 15 5 - 15  Comment: Performed at [facility], [address] [place], [place] [address]  CBC     Status: Abnormal   Collection Time: 06/21/23 11:53 AM  Result Value Ref Range   WBC 5.9 4.0 - 10.5 K/uL   RBC 5.15 (H) 3.87 - 5.11 MIL/uL   Hemoglobin 14.1 12.0 - 15.0 g/dL   HCT 81.0 17.5 - 10.2 %   MCV 83.1 80.0 - 100.0 fL   MCH 27.4 26.0 - 34.0 pg   MCHC 32.9 30.0 - 36.0 g/dL   RDW 58.5 27.7 - 82.4 %   Platelets 246 150 - 400 K/uL   nRBC 0.0 0.0 - 0.2 %    Comment: Performed at [facility], [address], [place], [place] [address]  CBG monitoring, ED     Status: Abnormal   Collection Time: 06/21/23  3:55 PM  Result Value Ref Range   Glucose-Capillary 310 (H) 70 - 99 mg/dL    Comment: Glucose reference range applies only to samples taken after fasting for at least 8 hours.  Comprehensive metabolic panel     Status: Abnormal   Collection Time: 06/21/23  7:39 PM  Result Value Ref Range   Sodium 135 135 - 145 mmol/L   Potassium 4.5 3.5 - 5.1 mmol/L   Chloride 101 98 - 111 mmol/L   CO2 17 (L) 22 - 32 mmol/L   Glucose, Bld 373 (H) 70 - 99 mg/dL    Comment: Glucose reference range applies only to samples taken after fasting for at least 8 hours.   BUN 19 6 - 20 mg/dL   Creatinine, Ser 1.44 (H) 0.44 - 1.00 mg/dL   Calcium 9.6 8.9 - 31.5 mg/dL   Total Protein 8.3 (H) 6.5 - 8.1 g/dL   Albumin 3.8 3.5 - 5.0 g/dL    AST 28 15 - 41 U/L   ALT 23 0 - 44 U/L   Alkaline Phosphatase 107 38 - 126 U/L   Total Bilirubin 1.9 (H) <1.2 mg/dL   GFR, Estimated 35 (L) >60 mL/min    Comment: (NOTE) Calculated using the CKD-EPI Creatinine Equation (2021)    Anion gap 17 (H) 5 - 15    Comment: Performed at [facility], [address], [place], [place] [address]  Beta-hydroxybutyric acid     Status: Abnormal   Collection Time: 06/21/23  7:39 PM  Result Value Ref Range   Beta-Hydroxybutyric Acid 3.61 (H) 0.05 - 0.27 mmol/L    Comment: Performed at [facility], [address], [place], [place] [address]  Urinalysis, Routine w reflex microscopic -Urine, Clean Catch     Status: Abnormal   Collection Time: 06/21/23  9:06 PM  Result Value Ref Range   Color, Urine YELLOW YELLOW   APPearance CLEAR CLEAR   Specific Gravity, Urine 1.017 1.005 - 1.030   pH 6.0 5.0 - 8.0   Glucose, UA >=500 (A) NEGATIVE mg/dL   Hgb urine dipstick SMALL (A) NEGATIVE   Bilirubin Urine NEGATIVE NEGATIVE   Ketones, ur 80 (A) NEGATIVE mg/dL   Protein, ur 093 (A) NEGATIVE mg/dL   Nitrite NEGATIVE NEGATIVE   Leukocytes,Ua NEGATIVE NEGATIVE   RBC / HPF 0-5 0 - 5 RBC/hpf   WBC, UA 0-5 0 - 5 WBC/hpf   Bacteria, UA NONE SEEN NONE SEEN   Squamous Epithelial / HPF 0-5 0 - 5 /HPF   Hyaline Casts, UA PRESENT     Comment: Performed at [facility],  [address], [place], [place] [address]  Blood gas, venous     Status: Abnormal   Collection Time: 06/21/23  9:24 PM  Result Value Ref Range   pH, Ven 7.37 7.25 - 7.43   pCO2, Ven 29 (L) 44 - 60 mmHg   pO2, Ven 56 (H) 32 - 45 mmHg   Bicarbonate 16.8 (L) 20.0 - 28.0 mmol/L   Acid-base deficit 7.1 (H) 0.0 - 2.0 mmol/L   O2 Saturation 89.6 %   Patient temperature 37.0    Collection site BLOOD RIGHT HAND    Drawn by 1914     Comment: Performed at [facility], [address] [place], [place] [address]  Basic metabolic panel     Status: Abnormal   Collection Time: 06/21/23   9:24 PM  Result Value Ref Range   Sodium 135 135 - 145 mmol/L   Potassium 4.2 3.5 - 5.1 mmol/L   Chloride 103 98 - 111 mmol/L   CO2 15 (L) 22 - 32 mmol/L   Glucose, Bld 380 (H) 70 - 99 mg/dL    Comment: Glucose reference range applies only to samples taken after fasting for at least 8 hours.   BUN 21 (H) 6 - 20 mg/dL   Creatinine, Ser 6.21 (H) 0.44 - 1.00 mg/dL   Calcium 9.7 8.9 - 30.8 mg/dL   GFR, Estimated 37 (L) >60 mL/min    Comment: (NOTE) Calculated using the CKD-EPI Creatinine Equation (2021)    Anion gap 17 (H) 5 - 15    Comment: ELECTROLYTES REPEATED TO VERIFY Performed at [facility], [address] [place], [place] [address]   Beta-hydroxybutyric acid     Status: Abnormal   Collection Time: 06/21/23  9:24 PM  Result Value Ref Range   Beta-Hydroxybutyric Acid 3.85 (H) 0.05 - 0.27 mmol/L    Comment: Performed at [facility], [address], [place], [place] [address]  CBG monitoring, ED     Status: Abnormal   Collection Time: 06/21/23  9:34 PM  Result Value Ref Range   Glucose-Capillary 355 (H) 70 - 99 mg/dL    Comment: Glucose reference range applies only to samples taken after fasting for at least 8 hours.  CBG monitoring, ED     Status: Abnormal   Collection Time: 06/21/23 10:32 PM  Result Value Ref Range   Glucose-Capillary 272 (H) 70 - 99 mg/dL    Comment: Glucose reference range applies only to samples taken after fasting for at least 8 hours.  CBG monitoring, ED     Status: Abnormal   Collection Time: 06/21/23 11:55 PM  Result Value Ref Range   Glucose-Capillary 215 (H) 70 - 99 mg/dL    Comment: Glucose reference range applies only to samples taken after fasting for at least 8 hours.  Basic metabolic panel     Status: Abnormal   Collection Time: 06/22/23 12:33 AM  Result Value Ref Range   Sodium 138 135 - 145 mmol/L   Potassium 3.8 3.5 - 5.1 mmol/L   Chloride 107 98 - 111 mmol/L   CO2 18 (L) 22 - 32 mmol/L   Glucose, Bld 205 (H) 70 - 99  mg/dL    Comment: Glucose reference range applies only to samples taken after fasting for at least 8 hours.   BUN 20 6 - 20 mg/dL   Creatinine, Ser 5.28 (H) 0.44 - 1.00 mg/dL   Calcium 9.5 8.9 - 41.3 mg/dL   GFR, Estimated  41 (L) >60 mL/min    Comment: (NOTE) Calculated using the CKD-EPI Creatinine Equation (2021)    Anion gap 13 5 - 15    Comment: Performed at [facility], [address], [place], [place] [address]  Bilirubin, fractionated(tot/dir/indir)     Status: None   Collection Time: 06/22/23 12:33 AM  Result Value Ref Range   Total Bilirubin 1.1 <1.2 mg/dL   Bilirubin, Direct 0.2 0.0 - 0.2 mg/dL   Indirect Bilirubin 0.9 0.3 - 0.9 mg/dL    Comment: Performed at [facility], [address], [place], [place] [address]  Troponin I (High Sensitivity)     Status: None   Collection Time: 06/22/23 12:33 AM  Result Value Ref Range   Troponin I (High Sensitivity) 8 <18 ng/L    Comment: (NOTE) Elevated high sensitivity troponin I (hsTnI) values and significant  changes across serial measurements may suggest ACS but many other  chronic and acute conditions are known to elevate hsTnI results.  Refer to the "Links" section for chest pain algorithms and additional  guidance. Performed at [facility], [address] [place], [place] [address]   CBG monitoring, ED     Status: Abnormal   Collection Time: 06/22/23  1:03 AM  Result Value Ref Range   Glucose-Capillary 173 (H) 70 - 99 mg/dL    Comment: Glucose reference range applies only to samples taken after fasting for at least 8 hours.  CBG monitoring, ED     Status: Abnormal   Collection Time: 06/22/23  2:45 AM  Result Value Ref Range   Glucose-Capillary 184 (H) 70 - 99 mg/dL    Comment: Glucose reference range applies only to samples taken after fasting for at least 8 hours.  CBG monitoring, ED     Status: Abnormal   Collection Time: 06/22/23  4:36 AM  Result Value Ref Range   Glucose-Capillary 166 (H) 70 -  99 mg/dL    Comment: Glucose reference range applies only to samples taken after fasting for at least 8 hours.  Basic metabolic panel     Status: Abnormal   Collection Time: 06/22/23  5:52 AM  Result Value Ref Range   Sodium 141 135 - 145 mmol/L   Potassium 3.3 (L) 3.5 - 5.1 mmol/L   Chloride 110 98 - 111 mmol/L   CO2 21 (L) 22 - 32 mmol/L   Glucose, Bld 186 (H) 70 - 99 mg/dL    Comment: Glucose reference range applies only to samples taken after fasting for at least 8 hours.   BUN 17 6 - 20 mg/dL   Creatinine, Ser 2.13 (H) 0.44 - 1.00 mg/dL   Calcium 8.2 (L) 8.9 - 10.3 mg/dL   GFR, Estimated 47 (L) >60 mL/min    Comment: (NOTE) Calculated using the CKD-EPI Creatinine Equation (2021)    Anion gap 10 5 - 15    Comment: Performed at [facility], [address] [place], [place] [address]  Beta-hydroxybutyric acid     Status: None   Collection Time: 06/22/23  5:52 AM  Result Value Ref Range   Beta-Hydroxybutyric Acid 0.09 0.05 - 0.27 mmol/L    Comment: Performed at [facility], [address], [place], [place] [address]  CBG monitoring, ED     Status: Abnormal   Collection Time: 06/22/23  6:35 AM  Result Value Ref Range   Glucose-Capillary 156 (H) 70 - 99 mg/dL    Comment: Glucose reference  range applies only to samples taken after fasting for at least 8 hours.  CBG monitoring, ED     Status: Abnormal   Collection Time: 06/22/23  8:06 AM  Result Value Ref Range   Glucose-Capillary 143 (H) 70 - 99 mg/dL    Comment: Glucose reference range applies only to samples taken after fasting for at least 8 hours.  Basic metabolic panel     Status: Abnormal   Collection Time: 06/22/23  8:56 AM  Result Value Ref Range   Sodium 140 135 - 145 mmol/L   Potassium 3.8 3.5 - 5.1 mmol/L   Chloride 109 98 - 111 mmol/L   CO2 23 22 - 32 mmol/L   Glucose, Bld 129 (H) 70 - 99 mg/dL    Comment: Glucose reference range applies only to samples taken after fasting for at least 8 hours.    BUN 19 6 - 20 mg/dL   Creatinine, Ser 4.09 (H) 0.44 - 1.00 mg/dL   Calcium 9.0 8.9 - 81.1 mg/dL   GFR, Estimated 44 (L) >60 mL/min    Comment: (NOTE) Calculated using the CKD-EPI Creatinine Equation (2021)    Anion gap 8 5 - 15    Comment: Performed at [facility], [address], [place], [place] [address]  POC CBG, ED     Status: Abnormal   Collection Time: 06/22/23  1:04 PM  Result Value Ref Range   Glucose-Capillary 175 (H) 70 - 99 mg/dL    Comment: Glucose reference range applies only to samples taken after fasting for at least 8 hours.  Beta-hydroxybutyric acid     Status: Abnormal   Collection Time: 06/22/23  1:06 PM  Result Value Ref Range   Beta-Hydroxybutyric Acid 0.66 (H) 0.05 - 0.27 mmol/L    Comment: Performed at [facility], [address], [place], [place] [address]  Sedimentation rate     Status: Abnormal   Collection Time: 06/22/23  1:06 PM  Result Value Ref Range   Sed Rate 30 (H) 0 - 22 mm/hr    Comment: Performed at [facility], [address], [place], [place] [address]  C-reactive protein     Status: Abnormal   Collection Time: 06/22/23  1:06 PM  Result Value Ref Range   CRP 1.2 (H) <1.0 mg/dL    Comment: Performed at [facility], [address], [place], [place] [address]   *Note: Due to a large number of results and/or encounters for the requested time period, some results have not been displayed. A complete set of results can be found in Results Review.   CT ABDOMEN PELVIS W CONTRAST Result Date: 06/22/2023 CLINICAL DATA:  Acute abdominal pain. History of gastroparesis and constipation. EXAM: CT ABDOMEN AND PELVIS WITH CONTRAST TECHNIQUE: Multidetector CT imaging of the abdomen and pelvis was performed using the standard protocol following bolus administration of intravenous contrast. RADIATION DOSE REDUCTION: This exam was performed according to the departmental dose-optimization program which includes automated exposure control,  adjustment of the mA and/or kV according to patient size and/or use of iterative reconstruction technique. CONTRAST:  60mL OMNIPAQUE IOHEXOL 350 MG/ML SOLN COMPARISON:  Most recent CT 05/23/2023 FINDINGS: Lower chest: Patchy airspace disease in both lower lobes is increased from prior exam, greater than typically expected with atelectasis. No pleural effusion. There are coronary artery calcifications. Patulous distal esophagus with small hiatal hernia. Hepatobiliary: Focal fatty infiltration adjacent to the falciform ligament. No evidence of focal liver lesion. Clips  in the gallbladder fossa postcholecystectomy. No biliary dilatation. Pancreas: No ductal dilatation or inflammation. Punctate parenchymal calcifications again seen. Spleen: Normal in size without focal abnormality. Adrenals/Urinary Tract: No adrenal nodule. No hydronephrosis. No perinephric inflammation. No renal stone. The urinary bladder is moderately distended. No bladder wall thickening. Stomach/Bowel: Small hiatal hernia. Stomach is nondistended. Patulous proximal jejunum but no obstruction or inflammation. Persistent wall thickening of the ascending colon with a questionable transition point, coronal series 6, image 96. Small to moderate colonic stool distally. Small amount of stool in the rectum. Vascular/Lymphatic: Aortic atherosclerosis. No aortic aneurysm. Portal vein is patent. Few prominent low lung in all nodes, likely reactive. Reproductive: Left uterine fundal calcification is likely a calcified fibroid. No adnexal mass. Other: No ascites or free air. Small fat containing umbilical hernia. Musculoskeletal: There are no acute or suspicious osseous abnormalities. Right femoral intramedullary nail fixation. IMPRESSION: 1. Persistent wall thickening of the ascending colon with a questionable transition point. This may be infectious or inflammatory colitis, however underlying neoplasm is not excluded. Recommend correlation with colonoscopy. 2.  Patchy airspace disease in both lower lobes is increased from prior exam, greater than typically expected with atelectasis. This may represent pneumonia or aspiration. 3. Small hiatal hernia. Aortic Atherosclerosis (ICD10-I70.0). Electronically Signed   By: Narda Rutherford M.D.   On: 06/22/2023 01:38   [place] EKG SITE RITE Result Date: 06/21/2023 If Site Rite image not attached, placement could not be confirmed due to current cardiac rhythm.   Pending Labs Unresulted Labs (From admission, onward)     Start     Ordered   06/22/23 1700  Basic metabolic panel  (Diabetes Ketoacidosis (DKA))  5A & 5P,   STAT (with TIMED occurrences)      06/22/23 0942   06/22/23 1254  Calprotectin, Fecal  Once,   R        06/22/23 1253   06/22/23 1004  Gastrointestinal Panel by PCR , Stool  (Gastrointestinal Panel by PCR, Stool                                                                                                                                                     **Does Not include CLOSTRIDIUM DIFFICILE testing. **If CDIFF testing is needed, place order from the "C Difficile Testing" order set.**)  Once,   R        06/22/23 1004   06/22/23 0502  Rapid urine drug screen (hospital performed)  Add-on,   AD        06/22/23 0501            Vitals/Pain Today's Vitals   06/22/23 0850 06/22/23 1100 06/22/23 1305 06/22/23 1400  BP: 139/68 135/67  (!) 150/73  Pulse: 69 65  69  Resp: 16 11  20   Temp:   98.2 F (36.8 C)  TempSrc:   Oral   SpO2: 98% 98%  97%  PainSc:        Isolation Precautions Enteric precautions (UV disinfection)  Medications Medications  insulin regular, human (MYXREDLIN) 100 units/ 100 mL infusion (0 Units/hr Intravenous Stopped 06/22/23 0950)  lactated ringers infusion ( Intravenous Not Given 06/22/23 0759)  dextrose 5 % in lactated ringers infusion ( Intravenous Stopped 06/22/23 0006)  dextrose 50 % solution 0-50 mL (has no administration in time range)  potassium  chloride 10 mEq in 100 mL IVPB (10 mEq Intravenous Not Given 06/22/23 0039)  enoxaparin (LOVENOX) injection 40 mg (40 mg Subcutaneous Given 06/22/23 0953)  metoprolol tartrate (LOPRESSOR) tablet 25 mg (25 mg Oral Given 06/22/23 0952)  polyethylene glycol (MIRALAX / GLYCOLAX) packet 17 g (0 g Oral Hold 06/22/23 0811)  senna-docusate (Senokot-S) tablet 1 tablet (1 tablet Oral Given 06/22/23 0404)  acetaminophen (TYLENOL) tablet 1,000 mg (1,000 mg Oral Given 06/22/23 0651)  pregabalin (LYRICA) capsule 75 mg (75 mg Oral Given 06/22/23 1314)  rosuvastatin (CRESTOR) tablet 20 mg (20 mg Oral Given 06/22/23 1314)  HYDROmorphone (DILAUDID) injection 0.5 mg (has no administration in time range)  insulin aspart (novoLOG) injection 0-15 Units (has no administration in time range)  ondansetron (ZOFRAN-ODT) disintegrating tablet 4 mg (4 mg Oral Given 06/21/23 1250)  ondansetron (ZOFRAN) injection 4 mg (4 mg Intravenous Given 06/21/23 2307)  fentaNYL (SUBLIMAZE) injection 75 mcg (75 mcg Intravenous Given 06/21/23 2004)  lactated ringers bolus 1,000 mL (1,000 mLs Intravenous Bolus 06/21/23 2006)  LORazepam (ATIVAN) injection 0.5 mg (0.5 mg Intravenous Given 06/22/23 0012)  metoCLOPramide (REGLAN) tablet 5 mg (5 mg Oral Given 06/21/23 2357)  HYDROmorphone (DILAUDID) injection 1 mg (1 mg Intravenous Given 06/22/23 0001)  iohexol (OMNIPAQUE) 350 MG/ML injection 60 mL (60 mLs Intravenous Contrast Given 06/22/23 0127)  potassium chloride SA (KLOR-CON M) CR tablet 40 mEq (40 mEq Oral Given 06/22/23 0809)  insulin glargine-yfgn (SEMGLEE) injection 10 Units (10 Units Subcutaneous Given 06/22/23 0809)    Mobility non-ambulatory     Focused Assessments GI/Endo- DKA that has transitioned off insulin drip. Now on SSI.    R Recommendations: See Admitting Provider Note  Report given to:   Additional Notes:

## 2023-06-22 NOTE — ED Notes (Signed)
Dr. Antony Contras returned page, will enter orders for sliding scale.

## 2023-06-22 NOTE — Care Management Obs Status (Signed)
MEDICARE OBSERVATION STATUS NOTIFICATION   Patient Details  Name: [name] MRN: [id number] Date of Birth: [birth year]   Medicare Observation Status Notification Given:  Yes    Ronny Bacon, RN 06/22/2023, 4:11 PM

## 2023-06-22 NOTE — H&P (Signed)
Date: 06/22/2023         Patient Name:  [name] MRN: [id number]  DOB: 10-24-1965 Age / Sex: 57 y.o., female   PCP: Hoy Register, MD         Medical Service: Internal Medicine Teaching Service         Attending Physician: Dr. Reymundo Poll, MD    First Contact: Dr. Manuela Neptune, MD Pager [phone] Pager: [phone]  Second Contact: Dr. Marrianne Mood, MD Pager [phone] Pager: [phone]       After Hours (After 5p/  First Contact Pager: [phone]  weekends / holidays): Second Contact Pager: [phone]   Chief Concern: Abdominal pain and  N/V   History of Present Illness: This is a 57 year old female who has a past medical history of CAD status post CABG, hypertension, uncontrolled type 1 diabetes c/b gastroparesis  and distal LE neuropathy, metoclopramide induced tardive dyskinesia who presents due to concerns for abdominal pain  Patient reports she was in a normal state of health until about two days ago when she experienced worsening N/V  and was accompanied by the abdominal pain shortly after. She reports complete adherence to her home insulin but unsure how she uses it. Patient was in pain and unable to recall multiple of her medications. Initially reported that she had recently stopped Reglan, however, this medication has not been on med history for ~ 2 years. She denies any associated chest pain, shortness of breath, bleeding, fever, chills, diarrhea or confusion.   Of note patient was recently admitted for similar presentation and treated for DKA and discharged on 05/23/2023 with resolution in her GI symptoms. Patient has  not followed up with her PCP or endocrinologist post discharge. She had difficulty accessing Omnipod system. Per RN in the ED, no CGM or pod on patient.   Review of Systems  Constitutional:  Positive for diaphoresis. Negative for chills, fever and weight loss.  Eyes:  Negative for blurred vision and double vision.  Cardiovascular:   Positive for leg swelling. Negative for chest pain and palpitations.  Gastrointestinal:  Positive for abdominal pain, constipation, nausea and vomiting. Negative for blood in stool, diarrhea and melena.  Genitourinary:  Negative for dysuria, frequency and urgency.  Neurological:  Negative for seizures.     Past Medical History: T1DM  Gastroparesis Peripheral neuropathy Tardive dyskinesia 2/2 metroclopramide GERD MDD and GAD  CAD s/p CABG in 2019 HTN HLD Chronic calcifications of pancreas    Medications: Crestor 20 mg Pregabalin 75 mg  Lasix 40 mg Losartan 25 mg Lopressor 25 mg Glargine 9 units BID Aspart 4-8 units TID Tylenol 500 mg  Miralax  17 g BID Senna Docusate 8.6 -50 mg   Allergies: Penicillins - Diarrhea, Nausea And Vomiting Amide - Nausea And Vomiting Buprenorphine- Hives   Surgical History: Coronary artery bypass graft - 2019   Past Surgical History:  Procedure Laterality Date   CARDIAC SURGERY     COLONOSCOPY  09/27/2014   at [facility]   CORONARY ARTERY BYPASS GRAFT N/A 01/14/2018   Procedure: CORONARY ARTERY BYPASS GRAFTING (CABG)X3, RIGHT AND LEFT SAPHENOUS VEIN HARVEST, MAMMARY TAKE DOWN. MAMMARY TO LAD, SVG TO PD, SVG TO DISTAL CIRC.;  Surgeon: Delight Ovens, MD;  Location: [facility] OR;  Service: Open Heart Surgery;  Laterality: N/A;   ESOPHAGOGASTRODUODENOSCOPY  09/27/2014   at [facility], Dr Vashti Hey. biospy neg for celiac, neg for H pylori.    EYE SURGERY     gailstones  INTRAMEDULLARY (IM) NAIL INTERTROCHANTERIC Right 05/10/2019   Procedure: INTRAMEDULLARY (IM) NAIL INTERTROCHANTRIC;  Surgeon: Lyndle Herrlich, MD;  Location: [facility] ORS;  Service: Orthopedics;  Laterality: Right;   IR FLUORO GUIDE CV LINE RIGHT  02/01/2017   IR FLUORO GUIDE CV LINE RIGHT  03/06/2017   IR FLUORO GUIDE CV LINE RIGHT  03/25/2017   IR GENERIC HISTORICAL  01/24/2016   IR FLUORO GUIDE CV LINE RIGHT 01/24/2016 Darrell K Allred, PA-C [facility]   IR GENERIC HISTORICAL   01/24/2016   IR US GUIDE VASC ACCESS RIGHT 01/24/2016 Darrell K Allred, PA-C [facility]   IR US GUIDE VASC ACCESS RIGHT  02/01/2017   IR US GUIDE VASC ACCESS RIGHT  03/06/2017   IR US GUIDE VASC ACCESS RIGHT  03/25/2017   IR US GUIDE VASC ACCESS RIGHT  01/20/2019   IR VENIPUNCTURE 42YRS/OLDER BY MD  01/20/2019   LEFT HEART CATH AND CORONARY ANGIOGRAPHY N/A 01/07/2018   Procedure: LEFT HEART CATH AND CORONARY ANGIOGRAPHY;  Surgeon: Lennette Bihari, MD;  Location: [facility] INVASIVE CV LAB;  Service: Cardiovascular;  Laterality: N/A;   POSTERIOR VITRECTOMY AND MEMBRANE PEEL-LEFT EYE  09/28/2002   POSTERIOR VITRECTOMY AND MEMBRANE PEEL-RIGHT EYE  03/16/2002   RETINAL DETACHMENT SURGERY     TEE WITHOUT CARDIOVERSION N/A 01/14/2018   Procedure: TRANSESOPHAGEAL ECHOCARDIOGRAM (TEE);  Surgeon: Delight Ovens, MD;  Location: [facility] OR;  Service: Open Heart Surgery;  Laterality: N/A;    Family History:  HTN - Father  Diabetes - Brother  Social History:  She lives alone, unemployed and support herself on disability. She is independent on all ADL/iADLS ,Denies any use of alcohol or any illicit drugs. Follows up with Hoy Register, MD for her primary care needs and designate her sister Maurice Small [phone]  as her decision maker.  Physical Exam: Blood pressure (!) 182/72, pulse 96, temperature 98.6 F (37 C), temperature source Oral, resp. rate 18, SpO2 100%.  Constitutional: chronically ill appearing woman ,laying on bed, in severe distress HENT: normocephalic atraumatic, mucous membranes moist, rhythmic movements of tongue and jaw Cardiovascular: regular rate and rhythm, no m/r/g, no JVD Pulmonary/Chest:  lungs clear to auscultation bilaterally. Tachypneic but able to speak in full sentences . No crackles . Examination was limited by clothes in the hallway of the ED as I couldn't undress her gown.  Abdominal: Diffusely tender to palpation, worse on LUQ. No fluid wave  Neurological: alert & oriented x  3 MSK: no gross abnormalities. Trace edema bilaterally  Skin: warm and dry Psych: Sad mood.   Labs:    Latest Ref Rng & Units 06/21/2023   11:53 AM 05/25/2023    3:36 AM 05/24/2023    4:14 AM  CBC  WBC 4.0 - 10.5 K/uL 5.9  5.0  8.0   Hemoglobin 12.0 - 15.0 g/dL 44.0  10.2  72.5   Hematocrit 36.0 - 46.0 % 42.8  38.2  38.0   Platelets 150 - 400 K/uL 246  220  229        Latest Ref Rng & Units 06/22/2023   12:33 AM 06/21/2023    9:24 PM 06/21/2023    7:39 PM  CMP  Glucose 70 - 99 mg/dL 366  440  347   BUN 6 - 20 mg/dL 20  21  19    Creatinine 0.44 - 1.00 mg/dL 4.25  9.56  3.87   Sodium 135 - 145 mmol/L 138  135  135   Potassium 3.5 - 5.1  mmol/L 3.8  4.2  4.5   Chloride 98 - 111 mmol/L 107  103  101   CO2 22 - 32 mmol/L 18  15  17    Calcium 8.9 - 10.3 mg/dL 9.5  9.7  9.6   Total Protein 6.5 - 8.1 g/dL   8.3   Total Bilirubin <1.2 mg/dL 1.1   1.9   Alkaline Phos 38 - 126 U/L   107   AST 15 - 41 U/L   28   ALT 0 - 44 U/L   23     U/A -  ketones, glucosuria, and proteinuria without pyruria or bacteria. Hyaline casts present EKG:  Sinus rhythm with ST depressions in V3 and V4 . Unchanged from prior 04/2023.  Images and other studies: CT Abdomen Pelvis With Contrast  IMPRESSION: 1. Persistent wall thickening of the ascending colon with a questionable transition point. This may be infectious or inflammatory colitis, however underlying neoplasm is not excluded. Recommend correlation with colonoscopy. 2. Patchy airspace disease in both lower lobes is increased from prior exam, greater than typically expected with atelectasis. This may represent pneumonia or aspiration. 3. Small hiatal hernia.  Assessment & Plan:  Kiaraliz Trathen is a 57 y.o. history of CAD status post CABG, hypertension, diabetes, gastroparesis who presents due to concerns for abdominal pain, nausea and vomiting found have hyperglycemia and ketonuria and admitted for DKA  Principal Problem:   Diabetic  ketoacidosis (HCC) Active Problems:   Type 1 diabetes mellitus with hyperlipidemia (HCC)   Diabetic gastroparesis (HCC)   HLD (hyperlipidemia)   Essential hypertension   Tardive dyskinesia   S/P CABG x 3   CAD (coronary artery disease)  #DKA #uncontrolled T1DM;  #compensated AGMA #NAGMA Presented with 1 day of abdominal pain with associated nausea and vomiting ,found to  be hyperglycemic 310, with an anion gap of 17.  Beta hydroxy butyrate elevated at 3.61 ,VBG showed a pH of 7.37 consistent with diabetic ketoacidosis in the setting of  type 1 diabetes. Difficulty with acquiring omnipod in the past week. Unable to state her current dose of insulin. A1c 9.8 04/2023. Denies any fever, chills, CBC without leukocytosis, no concerns for infections at this time. No chest pain,trops and EKG reassuring .Denies any intoxication. Compensated respiratory alkalosis on presentation. Delta delta of 0.5, concomitant NAGMA likely in the setting of N/V. Initial EndoTool initiation delayed given difficult with PIV access, now resolved. GAP and bicarbonate improving on EndoTool. - BMP every 4 hours - EndoTool; transition off once GAP closes x2 - Patient supposed to be on Omnipod; inquire more at re-evaluation - Zofran PRN; no metoclopramide for this patient with Tardive dyskanesia  - NPO with sips with meds  #Nausea/Vomiting  Abdominal pain  #History of gastroparesis #Ascending colon wall thickening #Chronic constipation Continues to endorse  diffuse abdominal pain ,Midly worse on the left upper quadrant.Lipase within normal.Repeat CT a/p during this admission shows persistent wall thickening of the ascending colon with a questionable transition point, seen on 04/2023 CT scan. This may be infectious or inflammatory colitis ,however underlying neoplasm is not excluded. Wondering if this is contributing to her abdominal pain versus her DKA. However, this is not new. New changes in pancreas suggestive of chronic  pancreatitis, which could present similar to DKA.  No evidence of acute on chronic pancreatitis. LBM was 2 days ago. No signs or UTI or pyelonephritis. CTA abdomen in 2020 without vascular compromise; recurrent abdominal pain could be further evaluated for chronic mesenteric ischemia if  no improvements. - Optimize pain control - Dilaudid PRN  - Miralax and senna-Docusate scheduled - Monitor fever curve, WBC, and systemic signs of infection - Follow up colonoscopy outpatient post discharge  - Bowel regimen  #Gastroparesis #Tardive dyskinesia #Peripheral neuroparesis Chronic. Previously on erythromycin and metoclopramide c/b buccal-lingual abnormal movements.  Has not had medications for this since 2022. Symptomatically treated with antiemetics.  - Small, frequent meals - Pain control as tolerated - Restarted home lyrica at [facility] at   #Chronic calcific pancreatitis  Prior history of pancreatitis in 2013 per chart review. No history of EtOH use. Likely in setting of autoimmune disease. No history of diarrhea associated with foods; however, patient at risk for exocrine pancreatic insufficiency - Low fat/Heart healthy diet - Treat hyperglycemia as above - S/p fentanyl, without improvement - Dilaudid 1 mg IV with improvement of symptoms - PRN pain medications as able  #Patchy airway disease CT lower chest showed Patchy airspace disease in both lower lobes is increased from prior exam, greater than typically expected with atelectasis, which may indicate pneumonia or aspiration.No respiratory symptoms or leukocytosis at this time   - Monitor fever curve, respiratory data, and WBC  #AKI on CKD stage IIIa, resolving Creatinine 1.61 on presentation, with a baseline creatinine of 1.2-1.5 likely due to dehydration in the setting of poor PO intake and DKA. Cr improving since on EndoTool and fluid resuscitation. No renal structural abnormalities on CT.  - Trend Cr - IVF  - Outpatient follow up    Resolved #Hyperbilirubinemia  Total bili of 1.6  on admission. Fractionated T bili with normal levels - Trend CMP   #Hematuria  Hematuria seen on 04/2023, now resolved on admission U/A.   Chronic medical conditions: CAD s/p CABG in 2019 HTN HFPEF:  normal troponin and unchanged EKG from prior.  Last TTE in 2020 with normal EF and no diastolic dysfunction. Reports nonadherence to home furosemide, metoprolol, or losartan for the past two days given n/v. Restarted home metoprolol. Hyperlipidemia: resumed on rosuvastatin 40 mg daily GERD: not currently on PPIs. Documented asthma hx: not on inhaler tx Uterine fibroid; calcified mass seen seen on current and prior imaging  Level of care: progressive  Diet: NPO  IVF: LR Bolus  VTE: enoxaparin (LOVENOX) injection 40 mg Start: 06/22/23 1000 Code: FULL CODE  Surrogate: Maurice Small [phone]   Signed: Kathleen Lime, MD 06/22/2023, 4:59 AM

## 2023-06-22 NOTE — Progress Notes (Signed)
Secure chat sent to doctors involved that IR placed her PICC lines per [facility] list and repeated placements by IR.

## 2023-06-22 NOTE — ED Notes (Signed)
Message sent to residents reference a sliding scale insulin regimen for patient @ 1315. No answer, so they were paged.

## 2023-06-22 NOTE — Care Management (Signed)
Attempted to call patient for OBV notice at [phone], no answer, mailbox full unable to leave VM.

## 2023-06-22 NOTE — Progress Notes (Signed)
Page sent to attending MD, who routed to night team. Awaiting BMP; will transition off Endotool promptly

## 2023-06-22 NOTE — ED Notes (Signed)
Helped patient to the bedside toilet patient did well

## 2023-06-23 DIAGNOSIS — K529 Noninfective gastroenteritis and colitis, unspecified: Secondary | ICD-10-CM | POA: Diagnosis not present

## 2023-06-23 LAB — RAPID URINE DRUG SCREEN, HOSP PERFORMED
Amphetamines: NOT DETECTED
Barbiturates: NOT DETECTED
Benzodiazepines: NOT DETECTED
Cocaine: NOT DETECTED
Opiates: POSITIVE — AB
Tetrahydrocannabinol: NOT DETECTED

## 2023-06-23 LAB — BASIC METABOLIC PANEL
Anion gap: 9 (ref 5–15)
BUN: 24 mg/dL — ABNORMAL HIGH (ref 6–20)
CO2: 22 mmol/L (ref 22–32)
Calcium: 9 mg/dL (ref 8.9–10.3)
Chloride: 107 mmol/L (ref 98–111)
Creatinine, Ser: 1.53 mg/dL — ABNORMAL HIGH (ref 0.44–1.00)
GFR, Estimated: 40 mL/min — ABNORMAL LOW (ref >60)
Glucose, Bld: 241 mg/dL — ABNORMAL HIGH (ref 70–99)
Potassium: 4.4 mmol/L (ref 3.5–5.1)
Sodium: 138 mmol/L (ref 135–145)

## 2023-06-23 LAB — GLUCOSE, CAPILLARY
Glucose-Capillary: 234 mg/dL — ABNORMAL HIGH (ref 70–99)
Glucose-Capillary: 264 mg/dL — ABNORMAL HIGH (ref 70–99)

## 2023-06-23 MED ORDER — AZITHROMYCIN 500 MG PO TABS
1000.0000 mg | ORAL_TABLET | Freq: Once | ORAL | Status: AC
  Administered 2023-06-23: 1000 mg via ORAL
  Filled 2023-06-23: qty 2

## 2023-06-23 MED ORDER — INSULIN GLARGINE-YFGN 100 UNIT/ML ~~LOC~~ SOLN
10.0000 [IU] | Freq: Two times a day (BID) | SUBCUTANEOUS | Status: DC
  Administered 2023-06-23: 10 [IU] via SUBCUTANEOUS
  Filled 2023-06-23 (×2): qty 0.1

## 2023-06-23 NOTE — Discharge Summary (Addendum)
Name: [name] MRN: [id number] DOB: 11-03-1965 57 y.o. PCP: Hoy Register, MD  Date of Admission: 06/21/2023 11:08 AM Date of Discharge:  06/23/2023 Attending Physician: Dr. Antony Contras  DISCHARGE DIAGNOSIS:  Primary Problem: Colitis   Hospital Problems: Principal Problem:   Colitis Active Problems:   Diabetic gastroparesis (HCC)   Type 1 diabetes mellitus with hyperlipidemia (HCC)   S/P CABG x 3   Diabetic ketoacidosis (HCC)   CAD (coronary artery disease)   Abdominal pain    DISCHARGE MEDICATIONS:   Allergies as of 06/23/2023       Reactions   Anesthetics, Amide Nausea And Vomiting   Chlorhexidine Other (See Comments)   Unknown reaction   Penicillins Diarrhea, Nausea And Vomiting, Other (See Comments)   Enkaid [encainide] Nausea And Vomiting   Reglan [metoclopramide] Swelling, Other (See Comments)   Dystonia Muscle rigidity Slurred speech Tongue swelling   Subutex [buprenorphine] Rash        Medication List     TAKE these medications    acetaminophen 500 MG tablet Commonly known as: TYLENOL Take 1,000 mg by mouth at bedtime as needed for mild pain.   ammonium lactate 12 % cream Commonly known as: AMLACTIN Apply 1 Application topically as needed for dry skin.   aspirin EC 81 MG tablet Take 1 tablet (81 mg total) by mouth daily.   furosemide 40 MG tablet Commonly known as: LASIX Take 1 tablet (40 mg total) by mouth daily.   insulin aspart 100 UNIT/ML injection Commonly known as: NovoLOG 4-8 units three times a day 15 min before meals What changed:  how much to take how to take this when to take this additional instructions   insulin glargine 100 UNIT/ML injection Commonly known as: Lantus Lantus 9 units bid   losartan 25 MG tablet Commonly known as: COZAAR Take 1 tablet (25 mg total) by mouth daily. Hold this medication if your systolic blood pressure ( top number) s less than 100. Please call your doctor is your systolic blood  pressure is less than 100   metoprolol tartrate 25 MG tablet Commonly known as: LOPRESSOR Take 1 tablet (25 mg total) by mouth 2 (two) times daily. What changed: how much to take   ondansetron 4 MG tablet Commonly known as: Zofran Take 1 tablet (4 mg total) by mouth every 8 (eight) hours as needed for nausea or vomiting.   oxyCODONE-acetaminophen 10-325 MG tablet Commonly known as: PERCOCET Take 1 tablet by mouth 3 (three) times daily as needed.   polyethylene glycol 17 g packet Commonly known as: MIRALAX / GLYCOLAX Take 17 g by mouth 2 (two) times daily. What changed:  when to take this reasons to take this   pregabalin 75 MG capsule Commonly known as: LYRICA Take 1 capsule (75 mg total) by mouth daily. What changed: when to take this   rosuvastatin 20 MG tablet Commonly known as: CRESTOR Take 1 tablet (20 mg total) by mouth daily.       DISPOSITION AND FOLLOW-UP:  Ms.Shanique Secundino was discharged from [facility] in [place] condition. At the hospital follow up visit please address:  Type 1 Diabetes Mellitus/DKA: Please ensure follow up with endocrinology about the insulin pump. Recheck BMP, blood glucose. Ensure adherence to insulin regimen, titrate as needed.   Chronic abdominal pain/Colitis: CT abdomen redemonstrated colonic wall thickening concerning for infectious vs inflammatory colitis. We treated her for infectious colitis prior to discharge, though we are more suspicious of an inflammatory etiology. GI  evaluated her and recommended a colonoscopy, which she declined to do inpatient. Please follow up on this and ensure she schedules a colonoscopy/sees her outpatient GI doctor.   Follow-up Appointments:  Follow-up Information     Hoy Register, MD. Schedule an appointment as soon as possible for a visit in 1 week(s).   Specialty: Family Medicine Why: Call and make a hospital follow up appointment in 1-2 weeks. Contact information: [address] [place] [address] [place] [address] [phone]         Thapa, [place], MD. Call.   Specialty: Endocrinology Why: Please call and make a follow up appointment with your endocrinologist in the next few weeks. Contact information: [address] [place] [address] [phone]                 HOSPITAL COURSE:  Patient Summary: Diabetic ketoacidosis, resolved Type 1 diabetes mellitus, poorly controlled She presented to the emergency department with acute on chronic abdominal pain and nausea and vomiting for 2 days.  She was found to be hyperglycemic with an anion gap metabolic acidosis and a mild elevation of beta hydroxybutyric acid, however her pH was 7.37.  She was started on IV insulin and quickly closed her anion gap with improvement of her clinical condition.  She continued to experience some abdominal pain and nausea, however this appears to be her baseline in the setting of her diabetic gastroparesis. She states that she has been taking her insulin regularly, however reports missing a dose of her long-acting insulin while she was waiting in the emergency department.  With her DKA resolved, we restarted her on her home dose of long-acting insulin and mealtime insulin, and instructed her to increase her long-acting insulin by 1 unit twice daily if her fasting blood glucose is greater than 140. Her blood glucose has been well-controlled on this regimen.   Chronic Abdominal pain History of colonic wall thickening  CT of her abdomen redemonstrated a thickening of her ascending colon with a possible transition point concerning for infectious versus inflammatory colitis.  She denies any other symptoms consistent with colitis including fever, diarrhea, or hematochezia.  Given her her persistent abdominal pain, we consulted gastroenterology.  They evaluated her and recommended a colonoscopy, however she deferred this and preferred to complete the procedure outpatient.  We treated her for infectious colitis with 1 gram of azithromycin prior to discharge, but we are more suspicious of an inflammatory etiology. She states she plans to follow up outpatient with her gastroenterologist to schedule a colonoscopy.    DISCHARGE INSTRUCTIONS:   Discharge Instructions     Call MD for:  difficulty breathing, headache or visual disturbances   Complete by: As directed    Call MD for:  extreme fatigue   Complete by: As directed    Call MD for:  persistant dizziness or light-headedness   Complete by: As directed    Call MD for:  persistant nausea and vomiting   Complete by: As directed    Call MD for:  severe uncontrolled pain   Complete by: As directed    Call MD for:  temperature >100.4   Complete by: As directed    Diet - low sodium heart healthy   Complete by: As directed    Discharge instructions   Complete by: As directed    You were hospitalized for diabetic ketoacidosis. You were treated with insulin and your diabetic ketoacidosis has now resolved. Thank you for allowing [place] to  be part of your care.    As we discussed, the CT scan showed an area in your colon with inflammation. We are concerned that this represents colitis, either inflammatory or infectious, so we have treated you with an antibiotic called Azithromycin. You spoke with the Gastroenterology specialists who recommended a Colonoscopy, which you did not want done in the hospital. It is very important that you have this procedure done outside of the hospital so we can figure out what is causing the inflammation.   Please call and schedule a follow up appointment with your PCP, Hoy Register, MD, in the next 1-2 weeks.   Please also call your endocrinologist and schedule a follow up appointment to start the insulin pump and have your type 1 diabetes monitored more closely. Here is their information:  [place] Thapa, MD [facility] [address] [place], [address] [place], [place] [address] Phone # [phone]  Please note these changes made to your medications:   Please CONTINUE taking:   Insulin glargine (Lantus), 9 units twice daily. If you check your blood sugar in the morning and it is greater than 140, increase the dose to 10 units twice daily.   Insulin aspart (Novolog), three times daily with meals based on carb counts.  You may continue taking the rest of your medications as prescribed.   Please make sure to make the follow up appointments I have mentioned above. If you experience any severe worsening of your abdominal pain, fevers, chills, persistent nausea and vomiting, or blood in your stool, please return to the emergency department for evaluation.   Increase activity slowly   Complete by: As directed    No wound care   Complete by: As directed        SUBJECTIVE:   Patient states she feels better today. Still some mild abdominal pain, which seems to be her baseline and she attributes to gastroparesis. No new concerns.   Discharge Vitals:   BP (!) 126/49 (BP Location: Left Arm)   Pulse 66   Temp 98.9 F (37.2 C) (Oral)   Resp 18   Ht 5\' 1"  (1.549 m)   Wt 82 kg   SpO2 93%   BMI 34.16 kg/m   OBJECTIVE:  Physical Exam Constitutional:      Appearance: She is well-developed.  HENT:     Mouth/Throat:     Mouth: Mucous membranes are moist.  Cardiovascular:     Rate and Rhythm: Normal rate and regular rhythm.  Pulmonary:     Effort: Pulmonary effort is normal.     Breath sounds: Normal breath sounds.  Abdominal:     General: Abdomen is flat. There is no distension.     Palpations: Abdomen is soft.     Comments: Mild tenderness to deep palpation in epigastric region  Skin:    General: Skin is warm and dry.  Neurological:     General: No focal deficit present.     Mental Status: She is alert and oriented to person, place, and time.  Psychiatric:        Mood and Affect: Mood normal.     Pertinent Labs, Studies,  and Procedures:     Latest Ref Rng & Units 06/21/2023   11:53 AM 05/25/2023    3:36 AM 05/24/2023    4:14 AM  CBC  WBC 4.0 - 10.5 K/uL 5.9  5.0  8.0   Hemoglobin 12.0 - 15.0 g/dL 91.4  78.2  11.9  Hematocrit 36.0 - 46.0 % 42.8  38.2  38.0   Platelets 150 - 400 K/uL 246  220  229        Latest Ref Rng & Units 06/23/2023    6:28 AM 06/22/2023    5:01 PM 06/22/2023    8:56 AM  CMP  Glucose 70 - 99 mg/dL 696  295  284   BUN 6 - 20 mg/dL 24  19  19    Creatinine 0.44 - 1.00 mg/dL 1.32  4.40  1.02   Sodium 135 - 145 mmol/L 138  137  140   Potassium 3.5 - 5.1 mmol/L 4.4  4.1  3.8   Chloride 98 - 111 mmol/L 107  107  109   CO2 22 - 32 mmol/L 22  20  23    Calcium 8.9 - 10.3 mg/dL 9.0  9.1  9.0     CT ABDOMEN PELVIS W CONTRAST Result Date: 06/22/2023 IMPRESSION: 1. Persistent wall thickening of the ascending colon with a questionable transition point. This may be infectious or inflammatory colitis, however underlying neoplasm is not excluded. Recommend correlation with colonoscopy. 2. Patchy airspace disease in both lower lobes is increased from prior exam, greater than typically expected with atelectasis. This may represent pneumonia or aspiration. 3. Small hiatal hernia. Aortic Atherosclerosis (ICD10-I70.0).  Signed: Annett Fabian, MD Internal Medicine Resident, PGY-1 [facility]  Pager: [phone] 10:24 AM, 06/23/2023

## 2023-06-23 NOTE — Progress Notes (Signed)
Pt. States she feels weak. PT evaluation offered prior to d/c. Pt. Declined and states she just wants to go home. Taxi voucher obtained from SW  [employer]

## 2023-06-23 NOTE — Progress Notes (Signed)
DISCHARGE NOTE HOME [name] to be discharged Home per MD order. Discussed prescriptions and follow up appointments with the patient. Prescriptions given to patient; medication list explained in detail. Patient verbalized understanding.  Skin clean, dry and intact without evidence of skin break down, no evidence of skin tears noted. IV catheter discontinued intact. Site without signs and symptoms of complications. Dressing and pressure applied. Pt denies pain at the site currently. No complaints noted.  Patient free of lines, drains, and wounds.   An After Visit Summary (AVS) was printed and given to the patient. Patient escorted via wheelchair, and discharged home via taxi.  Margarita Grizzle, RN

## 2023-06-23 NOTE — Plan of Care (Signed)

## 2023-06-24 ENCOUNTER — Telehealth: Payer: Self-pay

## 2023-06-24 NOTE — Transitions of Care (Post Inpatient/ED Visit) (Signed)
   06/24/2023  Name: [name] MRN: [id number] DOB: 20-Apr-1966  Today's TOC FU Call Status: Today's TOC FU Call Status:: Successful TOC FU Call Completed TOC FU Call Complete Date: 06/24/23 Patient's Name and Date of Birth confirmed.  Transition Care Management Follow-up Telephone Call Date of Discharge: 06/23/23 Discharge Facility: [facility]) Type of Discharge: Inpatient Admission Primary Inpatient Discharge Diagnosis:: colitis,DKA How have you been since you were released from the hospital?: Better Any questions or concerns?: No  Items Reviewed: Did you receive and understand the discharge instructions provided?: Yes Medications obtained,verified, and reconciled?: No Medications Not Reviewed Reasons:: Other: (She said she has all of her medications as well as a Dexcom cgm  and she did not have any questions about the med regime and did not need to review the med list. No new medications were prescribed.) Any new allergies since your discharge?: No Dietary orders reviewed?: Yes Type of Diet Ordered:: heart healthy, low sodium, diabetic Do you have support at home?: Yes People in Home: alone Name of Support/Comfort Primary Source: family checks on her.  Medications Reviewed Today: Medications Reviewed Today   Medications were not reviewed in this encounter     Home Care and Equipment/Supplies: Were Home Health Services Ordered?: No Any new equipment or medical supplies ordered?: No  Functional Questionnaire: Do you need assistance with bathing/showering or dressing?: No Do you need assistance with meal preparation?: No Do you need assistance with eating?: No Do you have difficulty maintaining continence: No Do you need assistance with getting out of bed/getting out of a chair/moving?: Yes (ambulates with rollator) Do you have difficulty managing or taking your medications?: No  Follow up appointments reviewed: PCP Follow-up appointment confirmed?: Yes Date of  PCP follow-up appointment?: 11/04/23 Follow-up Provider: Dr Alvis Lemmings.  I offered to schedule her sooner with Dr Alvis Lemmings or another provider and she declined and said she wants to keep the appointment she already has.  She said she will call the clinic if she wants to be seen sooner. Specialist Hospital Follow-up appointment confirmed?: Yes Date of Specialist follow-up appointment?: 06/25/23 Follow-Up Specialty Provider:: She is supposed to see the diabetic educator about starting the Omnipod but her pharmacy does not have it in stock so she will need to call and re-schedule that appointment.  She said she has the phone numbers for endocrinology and GI and will call both of those clinics to schedule appointments. She stated that Dr Alvis Lemmings was placing a referral to weight management but she has not heard from them yet. I provided her with the phone number for [facility] and she said she will call to check on the status of the referral. Do you need transportation to your follow-up appointment?: No Do you understand care options if your condition(s) worsen?: Yes-patient verbalized understanding    SIGNATURE Robyne Peers, RN

## 2023-06-25 ENCOUNTER — Ambulatory Visit: Payer: Medicare HMO | Admitting: Nutrition

## 2023-06-27 ENCOUNTER — Telehealth: Payer: Self-pay | Admitting: Family Medicine

## 2023-06-27 DIAGNOSIS — E1065 Type 1 diabetes mellitus with hyperglycemia: Secondary | ICD-10-CM

## 2023-06-27 NOTE — Telephone Encounter (Signed)
Ophthalmology referral placed.

## 2023-07-05 ENCOUNTER — Other Ambulatory Visit: Payer: Self-pay | Admitting: Cardiovascular Disease

## 2023-07-11 ENCOUNTER — Ambulatory Visit: Payer: Self-pay

## 2023-07-11 ENCOUNTER — Telehealth: Payer: Self-pay

## 2023-07-11 MED ORDER — CIPROFLOXACIN HCL 500 MG PO TABS: 500.0000 mg | ORAL_TABLET | Freq: Two times a day (BID) | ORAL | 0 refills | Status: AC

## 2023-07-11 NOTE — Telephone Encounter (Signed)
3rd attempt to contact patient, no answer and mailbox full. Routing to practice.  Summary: Possible UTI - requesting rx   Pt requesting medication to be called in for possible UTI. Pt states she gets them frequently. Symptoms started about 5 days ago, started w/ odor and dark cloudiness of urine. Pt states she can drop by to do urine test but does not think she needs to be seen.  Please assist pt further     Reason for Disposition  Third attempt to contact caller AND no contact made. Phone number verified.  Answer Assessment - Initial Assessment Questions 3rd attempt to contact patient, no answer and mailbox full. Routing to practice.  Protocols used: No Contact or Duplicate Contact Call-A-AH

## 2023-07-11 NOTE — Telephone Encounter (Signed)
I spoke to the patient and she said she is sure she has a UTI. Her urine has been cloudy and foul smelling. These symptoms started about 5 days ago.  She stated that she has experienced multiple UTIs in the past and she believes that is what she has.  She is requesting an antibiotic be called in to her pharmacy  If she needs to come to the clinic for a urine test, she would not be able to do that until next week.    Please advise.  If needed, we can order a cab to bring her to the clinic for the urine specimen.

## 2023-07-11 NOTE — Telephone Encounter (Signed)
Summary: Possible UTI - requesting rx   Pt requesting medication to be called in for possible UTI. Pt states she gets them frequently. Symptoms started about 5 days ago, started w/ odor and dark cloudiness of urine. Pt states she can drop by to do urine test but does not think she needs to be seen.  Please assist pt further         Called pt - call went directly to voice mail. Unable to [facility] mail box is full

## 2023-07-11 NOTE — Telephone Encounter (Signed)
Summary: Possible UTI - requesting rx   Pt requesting medication to be called in for possible UTI. Pt states she gets them frequently. Symptoms started about 5 days ago, started w/ odor and dark cloudiness of urine. Pt states she can drop by to do urine test but does not think she needs to be seen.  Please assist pt further      Called pt - call went directly to voice mail. Unable to Leave message  - voice mail is full.

## 2023-07-11 NOTE — Telephone Encounter (Signed)
Copied from CRM [id number]. Topic: General - Other >> Jul 11, 2023 10:05 AM Shon Hale wrote: Reason for CRM: Pt requesting to speak to [facility].   Pt requesting callback

## 2023-07-11 NOTE — Telephone Encounter (Signed)
I called patient and informed her that Dr Alvis Lemmings sent a prescription for the antibiotic to her pharmacy and she was very appreciative.

## 2023-07-11 NOTE — Addendum Note (Signed)
Addended by: Hoy Register on: 07/11/2023 06:42 PM   Modules accepted: Orders

## 2023-07-11 NOTE — Telephone Encounter (Signed)
Rx for antibiotics sent to her Pharmacy.

## 2023-07-12 NOTE — Telephone Encounter (Signed)
See notes from Robyne Peers, CM

## 2023-07-15 DIAGNOSIS — E119 Type 2 diabetes mellitus without complications: Secondary | ICD-10-CM | POA: Diagnosis not present

## 2023-07-15 DIAGNOSIS — E6609 Other obesity due to excess calories: Secondary | ICD-10-CM | POA: Diagnosis not present

## 2023-07-15 DIAGNOSIS — Z6835 Body mass index (BMI) 35.0-35.9, adult: Secondary | ICD-10-CM | POA: Diagnosis not present

## 2023-07-15 DIAGNOSIS — M47816 Spondylosis without myelopathy or radiculopathy, lumbar region: Secondary | ICD-10-CM | POA: Diagnosis not present

## 2023-07-15 DIAGNOSIS — M21372 Foot drop, left foot: Secondary | ICD-10-CM | POA: Diagnosis not present

## 2023-07-15 DIAGNOSIS — M5442 Lumbago with sciatica, left side: Secondary | ICD-10-CM | POA: Diagnosis not present

## 2023-07-15 DIAGNOSIS — Z0000 Encounter for general adult medical examination without abnormal findings: Secondary | ICD-10-CM | POA: Diagnosis not present

## 2023-07-15 DIAGNOSIS — Z794 Long term (current) use of insulin: Secondary | ICD-10-CM | POA: Diagnosis not present

## 2023-07-15 DIAGNOSIS — Z79899 Other long term (current) drug therapy: Secondary | ICD-10-CM | POA: Diagnosis not present

## 2023-07-15 DIAGNOSIS — M5441 Lumbago with sciatica, right side: Secondary | ICD-10-CM | POA: Diagnosis not present

## 2023-07-17 ENCOUNTER — Encounter: Payer: Medicare HMO | Attending: Endocrinology | Admitting: Nutrition

## 2023-07-17 DIAGNOSIS — E1069 Type 1 diabetes mellitus with other specified complication: Secondary | ICD-10-CM | POA: Insufficient documentation

## 2023-07-17 DIAGNOSIS — E785 Hyperlipidemia, unspecified: Secondary | ICD-10-CM | POA: Insufficient documentation

## 2023-07-17 NOTE — Patient Instructions (Signed)
Bring pod, insulin and PDM to office next week.

## 2023-07-17 NOTE — Progress Notes (Signed)
Patient is here today to get her Dexcom app on her new phone and to get the OmniPod 5 pump started and trained on this.  After trying for 30 minutes, we were able to get her password for her phone to download the Dexcom app.  But she could not remember her dexcom log in or password.  After calling Dexcom,  and deleating and restarting the app, the app was downloaded to her phone and linked to her sensor.  Because she had a ride pick up, she was not able to be trained on the pump.  She will come back next week for this.

## 2023-07-19 DIAGNOSIS — Z79899 Other long term (current) drug therapy: Secondary | ICD-10-CM | POA: Diagnosis not present

## 2023-07-23 ENCOUNTER — Ambulatory Visit: Payer: Medicare HMO | Admitting: Nutrition

## 2023-07-24 ENCOUNTER — Ambulatory Visit: Payer: Medicare HMO | Admitting: Endocrinology

## 2023-07-24 ENCOUNTER — Encounter: Payer: Medicare HMO | Admitting: Nutrition

## 2023-07-24 DIAGNOSIS — E1069 Type 1 diabetes mellitus with other specified complication: Secondary | ICD-10-CM

## 2023-07-24 DIAGNOSIS — E785 Hyperlipidemia, unspecified: Secondary | ICD-10-CM | POA: Diagnosis not present

## 2023-07-29 NOTE — Progress Notes (Unsigned)
Patient had been on the OmniPod dash, but has been off of it for over 1 year..  She is here for pump training on her OmnIpod 6 and CGM training for Dexcom G6.  We downloaded the Dexcom G6 app to her phone and linked the readings to [facility].   We also set up her PDM to read the Dexcom G6 sensor, but because it takes a 2 hour warm up, the readings are not showing up on her PDM, or phone app.  The settings for her insulin pump were put into the PDM per Dr. Wadie Lessen orders:  Basal rate:   I/C: 1, ISF: 35, target: 120 with corrections over 120, Max bolus 30, and Max basal rate 2.5u/hr.  Timing: 4 hours. Patient had much difficulty putting these readings in, but assured her that if needed, she can get training over the phone for this.  Also showed her the pump training booklet and where she can find how to do this.   She filled a pod with Novolog insulin and attached this to her right abdomen.  We discussed the importance of site rotation for the pods and the correct placement of the pods with referene to where her sensor is located.  She reported good understanding of this.  The pump was started at   and a correction dose of   u was given do to her high blood sugar readings.  We

## 2023-07-31 ENCOUNTER — Ambulatory Visit: Payer: Medicare HMO | Admitting: Nutrition

## 2023-08-13 DIAGNOSIS — Z794 Long term (current) use of insulin: Secondary | ICD-10-CM | POA: Diagnosis not present

## 2023-08-13 DIAGNOSIS — Z6834 Body mass index (BMI) 34.0-34.9, adult: Secondary | ICD-10-CM | POA: Diagnosis not present

## 2023-08-13 DIAGNOSIS — M47816 Spondylosis without myelopathy or radiculopathy, lumbar region: Secondary | ICD-10-CM | POA: Diagnosis not present

## 2023-08-13 DIAGNOSIS — E119 Type 2 diabetes mellitus without complications: Secondary | ICD-10-CM | POA: Diagnosis not present

## 2023-08-13 DIAGNOSIS — Z79899 Other long term (current) drug therapy: Secondary | ICD-10-CM | POA: Diagnosis not present

## 2023-08-16 DIAGNOSIS — Z79899 Other long term (current) drug therapy: Secondary | ICD-10-CM | POA: Diagnosis not present

## 2023-08-28 ENCOUNTER — Ambulatory Visit: Payer: Medicare HMO | Admitting: Endocrinology

## 2023-08-28 ENCOUNTER — Telehealth: Payer: Self-pay | Admitting: Nutrition

## 2023-08-28 ENCOUNTER — Encounter: Payer: Self-pay | Admitting: Endocrinology

## 2023-08-28 VITALS — BP 126/80 | HR 69 | Ht 61.0 in | Wt 184.0 lb

## 2023-08-28 DIAGNOSIS — E1040 Type 1 diabetes mellitus with diabetic neuropathy, unspecified: Secondary | ICD-10-CM

## 2023-08-28 DIAGNOSIS — E1065 Type 1 diabetes mellitus with hyperglycemia: Secondary | ICD-10-CM | POA: Diagnosis not present

## 2023-08-28 LAB — POCT GLYCOSYLATED HEMOGLOBIN (HGB A1C): Hemoglobin A1C: 8.3 % — AB (ref 4.0–5.6)

## 2023-08-28 MED ORDER — PREGABALIN 150 MG PO CAPS
150.0000 mg | ORAL_CAPSULE | Freq: Three times a day (TID) | ORAL | 3 refills | Status: DC
Start: 2023-08-28 — End: 2024-05-13

## 2023-08-28 MED ORDER — INSULIN ASPART 100 UNIT/ML IJ SOLN: INTRAMUSCULAR | 5 refills | Status: DC

## 2023-08-28 NOTE — Progress Notes (Signed)
 Outpatient Endocrinology Note Yvette Devyn Griffing, MD  08/28/23  Patient's Name: [name]    DOB: 1966-03-24    MRN: [id number]                                                    REASON OF VISIT: Follow up for type 1 diabetes mellitus  PCP: Hoy Register, MD  HISTORY OF PRESENT ILLNESS:   [name] is a 58 y.o. old female with past medical history listed below, is here for follow up for type 1 diabetes mellitus.    Pertinent Diabetes History: Patient was previously seen by Dr. Lucianne Muss and Dr. Roosevelt Locks.  Patient was diagnosed with type 1 diabetes mellitus in 1977.  Patient had periodic episodes of diabetes ketoacidosis in the past.  Patient used to be on OmniPod Dash in the past was started in January 2021 was discontinued due to cost issue, around early 2023.  OmniPod 5 with Dexcom G6 was started in end of January 2025.  Chronic Diabetes Complications : Retinopathy: yes. Last ophthalmology exam was done on ?, following with ophthalmology regularly ?Marland Kitchen  Retinal detachment. Nephropathy: Microalbuminuria, CKD 3A on ACE/ARB /losartan Peripheral neuropathy: yes, on pregabalin, she has been seeing podiatry as well. Coronary artery disease: yes s/p CABG in 2019.  Stroke: yes She has gastroparesis treated with Zofran as needed.  Also on Reglan with good relief of symptoms.  Relevant comorbidities and cardiovascular risk factors: Obesity: yes Body mass index is 34.77 kg/m.  Hypertension: Yes  Hyperlipidemia : Yes, on statin   Current / Home Diabetic regimen includes:  OmniPod 5 with Dexcom G6 using NovoLog U100  Pump setting  Basal rates ( 20.6 units/day) maximum basal rate 2.5 units/h 12 AM : 0.75 units/hr 10 AM: 1.0 10:30 AM: 1.2 4 PM:      0.75  Carb ratio 12 AM: 1:1  Sensitivity 12 AM : 1:35  Target 120  Active insulin time 4 hours  She does not do actual carb counting she has been using carb number based on meal size.  Prior diabetic medications: OmniPod  Dash. OmniPod 4 insulin pump since 07/07/2019, currently not using since early 2023. Basal bolus insulin regimen.   Glycemic data /pump data:    OmniPod 5 Pump & Dexcom G6 Sensor Download (Reviewed and summarized below.)  Dates: February 22 -5, 2025, 14 days  Average total daily insulin:  28.2 units, Basal: 64%, Bolus: 36%  GMI on Dexcom 7.4% with active Sensor usage 90%.         Trends:  Random hyperglycemia with blood sugar up to 200-250 range mostly with lunch and sometime with supper.  Hyperglycemia some of the time related with late meal bolus and sometime not enough meal bolus.  Blood sugar in between the meals and overnight are acceptable.  No hypoglycemia.  She has been mostly using carb of 1, 2, 3, 4 or 5 based on meal size.  Hypoglycemia: Patient has no hypoglycemic episodes. Patient has hypoglycemia awareness.  Factors modifying glucose control: 1.  Diabetic diet assessment: 3 meals a day.  2.  Staying active or exercising: No formal exercise.  3.  Medication compliance: compliant most of the time.  Interval history  Pump and CGM data downloaded and reviewed as above.  GMI on CGM 7.4%.  Hemoglobin A1c today improved to 8.3%.  Complains  of tingling on the fingers more on the right side, feet and also some time pain and stiffness of the right hand.  She has been taking pregabalin 150 mg 2 times a day, she is wondering about taking 3 times a day to help with neuropathic pain.  She also complains of frequent urination with cloudy urine, denies dysuria, blood in urine or fever.  Discussed that we can check the urinalysis today however treatment for the UTI if present she needs to follow-up with primary care provider, patient like to talk with primary care provider regarding urine test.  Patient has been using OmniPod 5 with Dexcom G6 from end of January 2025.  REVIEW OF SYSTEMS As per history of present illness.   PAST MEDICAL HISTORY: Past Medical History:  Diagnosis  Date   AKI (acute kidney injury) (HCC)    Anemia, iron deficiency    Anxiety and depression 05/18/2015   CAD (coronary artery disease), native coronary artery 01/11/2018   s/p CABG - Non-STEMI with severe three-vessel disease noted July 2019   Depression    Diabetic gastroparesis (HCC)    Diabetic neuropathy, type I diabetes mellitus (HCC) 05/18/2015   DKA, type 1 (HCC) 03/18/2016   Essential hypertension    Gastroparesis    GERD (gastroesophageal reflux disease)    HLD (hyperlipidemia)    MDD (major depressive disorder), single episode, severe , no psychosis (HCC)    Tardive dyskinesia    Vitamin B12 deficiency 08/16/2015    PAST SURGICAL HISTORY: Past Surgical History:  Procedure Laterality Date   CARDIAC SURGERY     COLONOSCOPY  09/27/2014   at [facility]   CORONARY ARTERY BYPASS GRAFT N/A 01/14/2018   Procedure: CORONARY ARTERY BYPASS GRAFTING (CABG)X3, RIGHT AND LEFT SAPHENOUS VEIN HARVEST, MAMMARY TAKE DOWN. MAMMARY TO LAD, SVG TO PD, SVG TO DISTAL CIRC.;  Surgeon: Delight Ovens, MD;  Location: [facility] OR;  Service: Open Heart Surgery;  Laterality: N/A;   ESOPHAGOGASTRODUODENOSCOPY  09/27/2014   at [facility], Dr Vashti Hey. biospy neg for celiac, neg for H pylori.    EYE SURGERY     gailstones     INTRAMEDULLARY (IM) NAIL INTERTROCHANTERIC Right 05/10/2019   Procedure: INTRAMEDULLARY (IM) NAIL INTERTROCHANTRIC;  Surgeon: Lyndle Herrlich, MD;  Location: [facility] ORS;  Service: Orthopedics;  Laterality: Right;   IR FLUORO GUIDE CV LINE RIGHT  02/01/2017   IR FLUORO GUIDE CV LINE RIGHT  03/06/2017   IR FLUORO GUIDE CV LINE RIGHT  03/25/2017   IR GENERIC HISTORICAL  01/24/2016   IR FLUORO GUIDE CV LINE RIGHT 01/24/2016 Darrell K Allred, PA-C [facility]INTER[facility] RAD   IR GENERIC HISTORICAL  01/24/2016   IR US GUIDE VASC ACCESS RIGHT 01/24/2016 Darrell K Allred, PA-C [facility]   IR US GUIDE VASC ACCESS RIGHT  02/01/2017   IR US GUIDE VASC ACCESS RIGHT  03/06/2017   IR US GUIDE VASC ACCESS RIGHT  03/25/2017    IR US GUIDE VASC ACCESS RIGHT  01/20/2019   IR VENIPUNCTURE 103YRS/OLDER BY MD  01/20/2019   LEFT HEART CATH AND CORONARY ANGIOGRAPHY N/A 01/07/2018   Procedure: LEFT HEART CATH AND CORONARY ANGIOGRAPHY;  Surgeon: Lennette Bihari, MD;  Location: [facility] INVASIVE CV LAB;  Service: Cardiovascular;  Laterality: N/A;   POSTERIOR VITRECTOMY AND MEMBRANE PEEL-LEFT EYE  09/28/2002   POSTERIOR VITRECTOMY AND MEMBRANE PEEL-RIGHT EYE  03/16/2002   RETINAL DETACHMENT SURGERY     TEE WITHOUT CARDIOVERSION N/A 01/14/2018   Procedure: TRANSESOPHAGEAL ECHOCARDIOGRAM (TEE);  Surgeon:  Delight Ovens, MD;  Location: [facility] OR;  Service: Open Heart Surgery;  Laterality: N/A;    ALLERGIES: Allergies  Allergen Reactions   Anesthetics, Amide Nausea And Vomiting   Chlorhexidine Other (See Comments)    Unknown reaction   Penicillins Diarrhea, Nausea And Vomiting and Other (See Comments)   Enkaid [Encainide] Nausea And Vomiting   Reglan [Metoclopramide] Swelling and Other (See Comments)    Dystonia Muscle rigidity Slurred speech Tongue swelling   Subutex [Buprenorphine] Rash    FAMILY HISTORY:  Family History  Problem Relation Age of Onset   Cystic fibrosis Mother    Hypertension Father    Diabetes Brother    Hypertension Maternal Grandmother     SOCIAL HISTORY: Social History   Socioeconomic History   Marital status: Single    Spouse name: Not on file   Number of children: 1   Years of education: Not on file   Highest education level: Not on file  Occupational History   Occupation: Unemployed  Tobacco Use   Smoking status: Never   Smokeless tobacco: Never  Vaping Use   Vaping status: Never Used  Substance and Sexual Activity   Alcohol use: No   Drug use: Not Currently    Types: Marijuana    Comment: none in 4 years   Sexual activity: Yes    Partners: Male    Birth control/protection: None  Other Topics Concern   Not on file  Social History Narrative   Not on file   Social Drivers of  Health   Financial Resource Strain: Low Risk  (12/26/2022)   Overall Financial Resource Strain (CARDIA)    Difficulty of Paying Living Expenses: Not hard at all  Food Insecurity: No Food Insecurity (06/22/2023)   Hunger Vital Sign    Worried About Running Out of Food in the Last Year: Never true    Ran Out of Food in the Last Year: Never true  Transportation Needs: No Transportation Needs (06/22/2023)   PRAPARE - Administrator, Civil Service (Medical): No    Lack of Transportation (Non-Medical): No  Physical Activity: Inactive (12/26/2022)   Exercise Vital Sign    Days of Exercise per Week: 0 days    Minutes of Exercise per Session: 0 min  Stress: No Stress Concern Present (12/26/2022)   [employer] of Occupational Health - Occupational Stress Questionnaire    Feeling of Stress : Only a little  Social Connections: Moderately Isolated (12/26/2022)   Social Connection and Isolation Panel [NHANES]    Frequency of Communication with Friends and Family: More than three times a week    Frequency of Social Gatherings with Friends and Family: Three times a week    Attends Religious Services: 1 to 4 times per year    Active Member of Clubs or Organizations: No    Attends Banker Meetings: Never    Marital Status: Never married    MEDICATIONS:  Current Outpatient Medications  Medication Sig Dispense Refill   acetaminophen (TYLENOL) 500 MG tablet Take 1,000 mg by mouth at bedtime as needed for mild pain.     ammonium lactate (AMLACTIN) 12 % cream Apply 1 Application topically as needed for dry skin. 385 g 2   aspirin EC 81 MG tablet Take 1 tablet (81 mg total) by mouth daily.     famotidine (PEPCID) 40 MG tablet      furosemide (LASIX) 40 MG tablet Take 1 tablet (40 mg total) by mouth daily.  15 tablet 0   Insulin Disposable Pump (OMNIPOD 5 [id number] PODS GEN 5) MISC SMARTSIG:1 Each SUB-Q Every 3 Days     insulin glargine (LANTUS) 100 UNIT/ML injection Lantus 9 units  bid 10 mL 1   losartan (COZAAR) 25 MG tablet Take 1 tablet (25 mg total) by mouth daily. Hold this medication if your systolic blood pressure ( top number) s less than 100. Please call your doctor is your systolic blood pressure is less than 100 15 tablet 0   metoprolol tartrate (LOPRESSOR) 25 MG tablet Take 1 tablet (25 mg total) by mouth 2 (two) times daily. (Patient taking differently: Take 12.5 mg by mouth 2 (two) times daily.) 15 tablet 0   ondansetron (ZOFRAN) 4 MG tablet Take 1 tablet (4 mg total) by mouth every 8 (eight) hours as needed for nausea or vomiting. 90 tablet 1   oxyCODONE-acetaminophen (PERCOCET) 10-325 MG tablet Take 1 tablet by mouth 3 (three) times daily as needed.     polyethylene glycol (MIRALAX / GLYCOLAX) 17 g packet Take 17 g by mouth 2 (two) times daily. (Patient taking differently: Take 17 g by mouth daily as needed.) 14 each 0   rosuvastatin (CRESTOR) 20 MG tablet TAKE 1 TABLET(20 MG) BY MOUTH DAILY 15 tablet 0   insulin aspart (NOVOLOG) 100 UNIT/ML injection Use up to 40 units/day via insulin pump. 20 mL 5   pregabalin (LYRICA) 150 MG capsule Take 1 capsule (150 mg total) by mouth in the morning, at noon, and at bedtime. 270 capsule 3   No current facility-administered medications for this visit.    PHYSICAL EXAM: Vitals:   08/28/23 1327  BP: 126/80  Pulse: 69  SpO2: 96%  Weight: 184 lb (83.5 kg)  Height: 5\' 1"  (1.549 m)    Body mass index is 34.77 kg/m.  Wt Readings from Last 3 Encounters:  08/28/23 184 lb (83.5 kg)  06/23/23 180 lb 12.4 oz (82 kg)  05/24/23 179 lb 14.3 oz (81.6 kg)    General: Well developed, well nourished female in no apparent distress.  HEENT: AT/[place], no external lesions.  Eyes: Conjunctiva clear and no icterus. Neck: Neck supple  Lungs: Respirations not labored Neurologic: Alert, oriented, normal speech Extremities / Skin: Dry.    Diabetic Foot Exam - Simple   No data filed    LABS Reviewed Lab Results  Component Value  Date   HGBA1C 8.3 (A) 08/28/2023   HGBA1C 9.8 (A) 05/20/2023   HGBA1C 10.9 (A) 02/14/2023   No results found for: "FRUCTOSAMINE" Lab Results  Component Value Date   CHOL 183 08/09/2021   HDL 57.50 08/09/2021   LDLCALC 105 (H) 08/09/2021   LDLDIRECT 96.0 07/04/2022   TRIG 104.0 08/09/2021   CHOLHDL 3 08/09/2021   Lab Results  Component Value Date   MICRALBCREAT 165 (H) 05/06/2023   MICRALBCREAT 17.9 01/02/2022   Lab Results  Component Value Date   CREATININE 1.53 (H) 06/23/2023   Lab Results  Component Value Date   GFR 51.61 (L) 07/04/2022    ASSESSMENT / PLAN  1. Uncontrolled type 1 diabetes mellitus with hyperglycemia (HCC)   2. Diabetic neuropathy, type I diabetes mellitus (HCC)     Diabetes Mellitus type 1, complicated by CAD/nephropathy/neuropathy/retinopathy. - Diabetic status / severity: Uncontrolled.  Lab Results  Component Value Date   HGBA1C 8.3 (A) 08/28/2023    - Hemoglobin A1c goal : <7%  Patient has been on OmniPod 5 with Dexcom G6 since the end of January 2025.  Not able to download pump and CGM data in the clinic today to review.  Patient has improvement on hemoglobin A1c 8.3% today.  Patient is seen by diabetic educator today to set up OmniPod account and download the pump and CGM data.  Help appreciated.  Patient has improvement on diabetes control after being on OmniPod 5, GMI 7.4%.   - Medications: See below.  I) continue OmniPod 5 with Dexcom G6, using NovoLog U100.  Patient has Dexcom G6 supplies she mentions she it may last for about 2 months and after that we will change to Dexcom G7.  -Continue current setting of the pump, discussed about timing of the meal bolus 10 to 15 minutes before eating.  Advised to increase the carb number 2 for small meal, 4 for regular meal and 6 for larger meal.  - Home glucose testing: CGM and check as needed. - Discussed/ Gave Hypoglycemia treatment plan.  # Consult : not required at this time.    #  Annual urine for microalbuminuria/ creatinine ratio,microalbuminuria currently, continue ACE/ARB /losartan. Last  Lab Results  Component Value Date   MICRALBCREAT 165 (H) 05/06/2023    # Foot check nightly / neuropathy, continue pregabalin.  Discussed that improvement of diabetes may help with improving neuropathic symptoms.  Discussed that tingling of the hand could be related with neuropathy however stiffness and the pain may have been caused by other etiology. -Increase the dose of pregabalin 150 mg from 2 times a day to 3 times a day to see if that will help.  If she continues to have hand symptoms advised to talk with primary care provider as well.  # Patient has diabetic retinopathy, advised to follow-up with ophthalmology regularly..   - Diet: Make healthy diabetic food choices - Life style / activity / exercise: Discussed.  2. Blood pressure  -  BP Readings from Last 1 Encounters:  08/28/23 126/80    - Control is in target.  - No change in current plans.  3. Lipid status / Hyperlipidemia - Last  Lab Results  Component Value Date   LDLCALC 105 (H) 08/09/2021   - Continue rosuvastatin 20 mg daily.  Managed by primary care provider /cardiology.  [name] was seen today for follow-up.  Diagnoses and all orders for this visit:  Uncontrolled type 1 diabetes mellitus with hyperglycemia (HCC) -     POCT glycosylated hemoglobin (Hb A1C) -     insulin aspart (NOVOLOG) 100 UNIT/ML injection; Use up to 40 units/day via insulin pump.  Diabetic neuropathy, type I diabetes mellitus (HCC) -     pregabalin (LYRICA) 150 MG capsule; Take 1 capsule (150 mg total) by mouth in the morning, at noon, and at bedtime.     DISPOSITION Follow up in clinic in 6 weeks suggested.   All questions answered and patient verbalized understanding of the plan.  Yvette Anaalicia Reimann, MD [facility] [address] [place], [address] [place], [place] [address] Phone #  [phone]  At least part of this note was generated using voice recognition software. Inadvertent word errors may have occurred, which were not recognized during the proofreading process.

## 2023-08-28 NOTE — Telephone Encounter (Signed)
 Patient seeing DR. Thapa  Glooko set up:  user name:[email or web address], password: [email or web address]$, Dexcom clarity reestablished with her new dexcom :user name: [email or web address], PW: [phone]

## 2023-09-10 DIAGNOSIS — Z794 Long term (current) use of insulin: Secondary | ICD-10-CM | POA: Diagnosis not present

## 2023-09-10 DIAGNOSIS — M47816 Spondylosis without myelopathy or radiculopathy, lumbar region: Secondary | ICD-10-CM | POA: Diagnosis not present

## 2023-09-10 DIAGNOSIS — Z6834 Body mass index (BMI) 34.0-34.9, adult: Secondary | ICD-10-CM | POA: Diagnosis not present

## 2023-09-10 DIAGNOSIS — E119 Type 2 diabetes mellitus without complications: Secondary | ICD-10-CM | POA: Diagnosis not present

## 2023-09-10 DIAGNOSIS — Z79899 Other long term (current) drug therapy: Secondary | ICD-10-CM | POA: Diagnosis not present

## 2023-09-13 DIAGNOSIS — Z79899 Other long term (current) drug therapy: Secondary | ICD-10-CM | POA: Diagnosis not present

## 2023-09-24 ENCOUNTER — Telehealth: Payer: Self-pay | Admitting: Family Medicine

## 2023-09-24 ENCOUNTER — Telehealth: Payer: Self-pay

## 2023-09-24 NOTE — Telephone Encounter (Signed)
 I spoke to the patient and the conversation is documented in another telephone encounter today

## 2023-09-24 NOTE — Telephone Encounter (Unsigned)
 Copied from CRM [id number]. Topic: General - Call Back - No Documentation >> Sep 24, 2023  4:19 PM Higinio Roger wrote: Reason for CRM: Patient is requesting a callback from Robyne Peers, RN to discuss getting handicap sticker.  Callback #: [id number]

## 2023-09-24 NOTE — Telephone Encounter (Signed)
 Copied from CRM [id number]. Topic: General - Other >> Sep 24, 2023  4:34 PM Turkey B wrote: Reason for CRM: pt called in   To speak with Erskine Squibb about her paperwork

## 2023-09-24 NOTE — Telephone Encounter (Signed)
 I spoke to the patient and she is requesting a handicap placard to use when friends/ family are transporting her in the community. She said she can pick it up at [facility] and her sister will take her to the [facility]. I told her that I would leave the form for Dr Alvis Lemmings to complete/sign.

## 2023-09-25 NOTE — Telephone Encounter (Signed)
 Completed.

## 2023-09-25 NOTE — Telephone Encounter (Signed)
 I spoke to the patient and informed her that Dr Alvis Lemmings has signed the handicap placard application and I will leave it at the front desk for her to pick up.  She was very appreciative and said she will get it tomorrow if she is not able to get to the clinic today.

## 2023-10-03 ENCOUNTER — Encounter: Payer: Self-pay | Admitting: Cardiovascular Disease

## 2023-10-03 NOTE — Progress Notes (Unsigned)
 Cardiology Office Note:    Date:  10/04/2023   ID:  [name], DOB 11-28-1965, MRN [id number]  PCP:  Hoy Register, MD  Cardiologist:  Kristeen Miss, MD   Referring MD: Hoy Register, MD   Problem list 1.  Coronary artery disease-status post coronary artery bypass grafting 2.  Poorly controlled diabetes mellitus-complicated by noncompliance and neuropathy 3.  Hyperlipidemia  Chief Complaint  Patient presents with   Coronary Artery Disease   Hypertension          Sept. 10, 2019   [name] is a 58 y.o. female with a hx of poorly controlled diabetes mellitus and coronary artery disease.  She is status post coronary artery bypass grafting on January 14, 2018: She underwent CABG x 3 utilizing LIMA to LAD, SVG to PDA, and SVG to OM.  She is been to the emergency room over 50 times in the past year for diabetic ketoacidosis and gastroparesis. She was in the emergency room on September 1 with intractable nausea and vomiting.  She was not in DKA at that time.  Seen in the office for the first time today . Diabetes is under fairly good control currently   Has lots of issues with gastroparesis   Sept. 11, 2020  [name] is seen today for follow up visit Having issues with gastroparesis  No CP .    Nov. 4, 2021 [name] is seen for a work in visit for worsening dyspnea.  We had her take her furosemide for 3 day s straight which resolved her dyspnea.  Was previously taking the furosemide QOD .  Eats chicken pot pies several times a week .   Eats soup once very 2 weeks . Sausage in the AM,  Echo from Nov. 2020 shows normal LV systolic function ,   Was given script for atorvastatin ,  She did not take  Will change to rosuvastatin 10 mg a day   March 09, 2022:  [name] is seen for follow up of her  CAD, CABG, poorly controlled DM Still eating sweets, lots of carbs .    Echo shows that she has normal heart function. She is fallen twice since I last saw  her 2 years ago.  She broke her femur on 1 fall in her to be on another fall. She now has a foot drop.  She has been is in a skilled nursing facility for a while but now lives at home.  Is breathing better than she was 2 years ago   We discussed tightening up on her diet. She is not able to walk because of her foot drop I suggest a stationary bike . She has not thought about a bike  Has a gym in her appt complex  Is concerned about exercising alone .    April 11 ,2025 [name] is seen for follow for her CAD, CABG , poorly controlled DM Is getting some exercise  No cp .  Working out with her neighbor    Past Medical History:  Diagnosis Date   AKI (acute kidney injury) (HCC)    Anemia, iron deficiency    Anxiety and depression 05/18/2015   CAD (coronary artery disease), native coronary artery 01/11/2018   s/p CABG - Non-STEMI with severe three-vessel disease noted July 2019   Depression    Diabetic gastroparesis (HCC)    Diabetic neuropathy, type I diabetes mellitus (HCC) 05/18/2015   DKA, type 1 (HCC) 03/18/2016   Essential hypertension    Gastroparesis  GERD (gastroesophageal reflux disease)    HLD (hyperlipidemia)    MDD (major depressive disorder), single episode, severe , no psychosis (HCC)    Tardive dyskinesia    Vitamin B12 deficiency 08/16/2015    Past Surgical History:  Procedure Laterality Date   CARDIAC SURGERY     COLONOSCOPY  09/27/2014   at [facility]   CORONARY ARTERY BYPASS GRAFT N/A 01/14/2018   Procedure: CORONARY ARTERY BYPASS GRAFTING (CABG)X3, RIGHT AND LEFT SAPHENOUS VEIN HARVEST, MAMMARY TAKE DOWN. MAMMARY TO LAD, SVG TO PD, SVG TO DISTAL CIRC.;  Surgeon: Delight Ovens, MD;  Location: [facility] OR;  Service: Open Heart Surgery;  Laterality: N/A;   ESOPHAGOGASTRODUODENOSCOPY  09/27/2014   at [facility], Dr Vashti Hey. biospy neg for celiac, neg for H pylori.    EYE SURGERY     gailstones     INTRAMEDULLARY (IM) NAIL INTERTROCHANTERIC Right 05/10/2019    Procedure: INTRAMEDULLARY (IM) NAIL INTERTROCHANTRIC;  Surgeon: Lyndle Herrlich, MD;  Location: [facility] ORS;  Service: Orthopedics;  Laterality: Right;   IR FLUORO GUIDE CV LINE RIGHT  02/01/2017   IR FLUORO GUIDE CV LINE RIGHT  03/06/2017   IR FLUORO GUIDE CV LINE RIGHT  03/25/2017   IR GENERIC HISTORICAL  01/24/2016   IR FLUORO GUIDE CV LINE RIGHT 01/24/2016 Darrell K Allred, PA-C [facility]   IR GENERIC HISTORICAL  01/24/2016   IR US GUIDE VASC ACCESS RIGHT 01/24/2016 Darrell K Allred, PA-C [facility]   IR US GUIDE VASC ACCESS RIGHT  02/01/2017   IR US GUIDE VASC ACCESS RIGHT  03/06/2017   IR US GUIDE VASC ACCESS RIGHT  03/25/2017   IR US GUIDE VASC ACCESS RIGHT  01/20/2019   IR VENIPUNCTURE 58YRS/OLDER BY MD  01/20/2019   LEFT HEART CATH AND CORONARY ANGIOGRAPHY N/A 01/07/2018   Procedure: LEFT HEART CATH AND CORONARY ANGIOGRAPHY;  Surgeon: Lennette Bihari, MD;  Location: [facility] INVASIVE CV LAB;  Service: Cardiovascular;  Laterality: N/A;   POSTERIOR VITRECTOMY AND MEMBRANE PEEL-LEFT EYE  09/28/2002   POSTERIOR VITRECTOMY AND MEMBRANE PEEL-RIGHT EYE  03/16/2002   RETINAL DETACHMENT SURGERY     TEE WITHOUT CARDIOVERSION N/A 01/14/2018   Procedure: TRANSESOPHAGEAL ECHOCARDIOGRAM (TEE);  Surgeon: Delight Ovens, MD;  Location: [facility] OR;  Service: Open Heart Surgery;  Laterality: N/A;    Current Medications: Current Meds  Medication Sig   acetaminophen (TYLENOL) 500 MG tablet Take 1,000 mg by mouth at bedtime as needed for mild pain.   ammonium lactate (AMLACTIN) 12 % cream Apply 1 Application topically as needed for dry skin.   aspirin EC 81 MG tablet Take 1 tablet (81 mg total) by mouth daily.   famotidine (PEPCID) 40 MG tablet    furosemide (LASIX) 40 MG tablet Take 1 tablet (40 mg total) by mouth daily.   insulin aspart (NOVOLOG) 100 UNIT/ML injection Use up to 40 units/day via insulin pump.   Insulin Disposable Pump (OMNIPOD 5 [id number] PODS GEN 5) MISC SMARTSIG:1 Each SUB-Q Every 3 Days   insulin  glargine (LANTUS) 100 UNIT/ML injection Lantus 9 units bid   losartan (COZAAR) 25 MG tablet Take 1 tablet (25 mg total) by mouth daily. Hold this medication if your systolic blood pressure ( top number) s less than 100. Please call your doctor is your systolic blood pressure is less than 100   metoprolol tartrate (LOPRESSOR) 25 MG tablet Take 1 tablet (25 mg total) by mouth 2 (two) times daily. (Patient taking differently: Take 12.5 mg by mouth  2 (two) times daily.)   ondansetron (ZOFRAN) 4 MG tablet Take 1 tablet (4 mg total) by mouth every 8 (eight) hours as needed for nausea or vomiting.   oxyCODONE-acetaminophen (PERCOCET) 10-325 MG tablet Take 1 tablet by mouth 3 (three) times daily as needed.   polyethylene glycol (MIRALAX / GLYCOLAX) 17 g packet Take 17 g by mouth 2 (two) times daily. (Patient taking differently: Take 17 g by mouth daily as needed.)   pregabalin (LYRICA) 150 MG capsule Take 1 capsule (150 mg total) by mouth in the morning, at noon, and at bedtime.   rosuvastatin (CRESTOR) 20 MG tablet TAKE 1 TABLET(20 MG) BY MOUTH DAILY     Allergies:   Anesthetics, amide; Chlorhexidine; Penicillins; Enkaid [encainide]; Reglan [metoclopramide]; and Subutex [buprenorphine]   Social History   Socioeconomic History   Marital status: Single    Spouse name: Not on file   Number of children: 1   Years of education: Not on file   Highest education level: Not on file  Occupational History   Occupation: Unemployed  Tobacco Use   Smoking status: Never   Smokeless tobacco: Never  Vaping Use   Vaping status: Never Used  Substance and Sexual Activity   Alcohol use: No   Drug use: Not Currently    Types: Marijuana    Comment: none in 4 years   Sexual activity: Yes    Partners: Male    Birth control/protection: None  Other Topics Concern   Not on file  Social History Narrative   Not on file   Social Drivers of Health   Financial Resource Strain: Low Risk  (12/26/2022)   Overall  Financial Resource Strain (CARDIA)    Difficulty of Paying Living Expenses: Not hard at all  Food Insecurity: No Food Insecurity (06/22/2023)   Hunger Vital Sign    Worried About Running Out of Food in the Last Year: Never true    Ran Out of Food in the Last Year: Never true  Transportation Needs: No Transportation Needs (06/22/2023)   PRAPARE - Administrator, Civil Service (Medical): No    Lack of Transportation (Non-Medical): No  Physical Activity: Inactive (12/26/2022)   Exercise Vital Sign    Days of Exercise per Week: 0 days    Minutes of Exercise per Session: 0 min  Stress: No Stress Concern Present (12/26/2022)   [employer] of Occupational Health - Occupational Stress Questionnaire    Feeling of Stress : Only a little  Social Connections: Moderately Isolated (12/26/2022)   Social Connection and Isolation Panel [NHANES]    Frequency of Communication with Friends and Family: More than three times a week    Frequency of Social Gatherings with Friends and Family: Three times a week    Attends Religious Services: 1 to 4 times per year    Active Member of Clubs or Organizations: No    Attends Banker Meetings: Never    Marital Status: Never married     Family History: The patient's family history includes Cystic fibrosis in her mother; Diabetes in her brother; Hypertension in her father and maternal grandmother.  ROS:   Please see the history of present illness.     All other systems reviewed and are negative.  EKGs/Labs/Other Studies Reviewed:    The following studies were reviewed today:   EKG:          Recent Labs: 06/21/2023: ALT 23; Hemoglobin 14.1; Platelets 246 06/23/2023: BUN 24; Creatinine, Ser 1.53;  Potassium 4.4; Sodium 138  Recent Lipid Panel    Component Value Date/Time   CHOL 183 08/09/2021 1151   TRIG 104.0 08/09/2021 1151   HDL 57.50 08/09/2021 1151   CHOLHDL 3 08/09/2021 1151   VLDL 20.8 08/09/2021 1151   LDLCALC 105 (H)  08/09/2021 1151   LDLDIRECT 96.0 07/04/2022 1129     Physical Exam: Blood pressure (!) 117/58, pulse 77, resp. rate 16, weight 184 lb (83.5 kg), SpO2 97%.      GEN:  Well nourished, well developed in no acute distress HEENT: Normal NECK: No JVD; No carotid bruits LYMPHATICS: No lymphadenopathy CARDIAC: RRR , no murmurs, rubs, gallops RESPIRATORY:  Clear to auscultation without rales, wheezing or rhonchi  ABDOMEN: Soft, non-tender, non-distended MUSCULOSKELETAL:  No edema; No deformity  SKIN: Warm and dry NEUROLOGIC:  Alert and oriented x 3    ASSESSMENT:    1. Essential hypertension   2. Hypercholesterolemia   3. Coronary artery disease involving native coronary artery of native heart without angina pectoris     PLAN:       1.  Coronary artery disease: no angina .    S/p CABG .   Doing well .   Will refill her cardiac meds.  Check lipids, bmp, ALT to day     2. Shortness of breath:    stable   3.  Leg edema :   continue lasix    4.  Diabetes:    poorly controlled.   Medication Adjustments/Labs and Tests Ordered: Current medicines are reviewed at length with the patient today.  Concerns regarding medicines are outlined above.  Orders Placed This Encounter  Procedures   ALT   Basic metabolic panel with GFR   Lipid panel   No orders of the defined types were placed in this encounter.    Patient Instructions  Lab Work: Lipids, ALT, BMET today If you have labs (blood work) drawn today and your tests are completely normal, you will receive your results only by: MyChart Message (if you have MyChart) OR A paper copy in the mail If you have any lab test that is abnormal or we need to change your treatment, we will call you to review the results.  Follow-Up: At [facility], you and your health needs are our priority.  As part of our continuing mission to provide you with exceptional heart care, our providers are all part of one team.  This team  includes your primary Cardiologist (physician) and Advanced Practice Providers or APPs (Physician Assistants and Nurse Practitioners) who all work together to provide you with the care you need, when you need it.  Your next appointment:   1 year(s)  Provider:   Kristeen Miss, MD     1st Floor: - Lobby - Registration  - Pharmacy  - Lab - Cafe  2nd Floor: - PV Lab - Diagnostic Testing (echo, CT, nuclear med)  3rd Floor: - Vacant  4th Floor: - TCTS (cardiothoracic surgery) - AFib Clinic - Structural Heart Clinic - Vascular Surgery  - Vascular Ultrasound  5th Floor: - HeartCare Cardiology (general and EP) - Clinical Pharmacy for coumadin, hypertension, lipid, weight-loss medications, and med management appointments    Valet parking services will be available as well.     Signed, Kristeen Miss, MD  10/04/2023 2:54 PM    [facility]

## 2023-10-04 ENCOUNTER — Ambulatory Visit: Attending: Cardiology | Admitting: Cardiovascular Disease

## 2023-10-04 VITALS — BP 117/58 | HR 77 | Resp 16 | Wt 184.0 lb

## 2023-10-04 DIAGNOSIS — I2510 Atherosclerotic heart disease of native coronary artery without angina pectoris: Secondary | ICD-10-CM | POA: Diagnosis not present

## 2023-10-04 DIAGNOSIS — E7800 Pure hypercholesterolemia, unspecified: Secondary | ICD-10-CM | POA: Diagnosis not present

## 2023-10-04 DIAGNOSIS — I10 Essential (primary) hypertension: Secondary | ICD-10-CM

## 2023-10-04 MED ORDER — METOPROLOL TARTRATE 25 MG PO TABS: 12.5000 mg | ORAL_TABLET | Freq: Two times a day (BID) | ORAL | 3 refills | Status: AC

## 2023-10-04 MED ORDER — ROSUVASTATIN CALCIUM 20 MG PO TABS: 20.0000 mg | ORAL_TABLET | Freq: Every day | ORAL | 3 refills | Status: AC

## 2023-10-04 MED ORDER — LOSARTAN POTASSIUM 25 MG PO TABS: 25.0000 mg | ORAL_TABLET | Freq: Every day | ORAL | 3 refills | Status: AC

## 2023-10-04 MED ORDER — FUROSEMIDE 40 MG PO TABS: 40.0000 mg | ORAL_TABLET | Freq: Every day | ORAL | 3 refills | Status: AC

## 2023-10-04 NOTE — Patient Instructions (Signed)
 Lab Work: Lipids, ALT, BMET today If you have labs (blood work) drawn today and your tests are completely normal, you will receive your results only by: MyChart Message (if you have MyChart) OR A paper copy in the mail If you have any lab test that is abnormal or we need to change your treatment, we will call you to review the results.  Follow-Up: At [facility], you and your health needs are our priority.  As part of our continuing mission to provide you with exceptional heart care, our providers are all part of one team.  This team includes your primary Cardiologist (physician) and Advanced Practice Providers or APPs (Physician Assistants and Nurse Practitioners) who all work together to provide you with the care you need, when you need it.  Your next appointment:   1 year(s)  Provider:   Kristeen Miss, MD      1st Floor: - Lobby - Registration  - Pharmacy  - Lab - Cafe  2nd Floor: - PV Lab - Diagnostic Testing (echo, CT, nuclear med)  3rd Floor: - Vacant  4th Floor: - TCTS (cardiothoracic surgery) - AFib Clinic - Structural Heart Clinic - Vascular Surgery  - Vascular Ultrasound  5th Floor: - HeartCare Cardiology (general and EP) - Clinical Pharmacy for coumadin, hypertension, lipid, weight-loss medications, and med management appointments    Valet parking services will be available as well.

## 2023-10-08 DIAGNOSIS — Z79899 Other long term (current) drug therapy: Secondary | ICD-10-CM | POA: Diagnosis not present

## 2023-10-08 DIAGNOSIS — R030 Elevated blood-pressure reading, without diagnosis of hypertension: Secondary | ICD-10-CM | POA: Diagnosis not present

## 2023-10-08 DIAGNOSIS — Z794 Long term (current) use of insulin: Secondary | ICD-10-CM | POA: Diagnosis not present

## 2023-10-08 DIAGNOSIS — E119 Type 2 diabetes mellitus without complications: Secondary | ICD-10-CM | POA: Diagnosis not present

## 2023-10-08 DIAGNOSIS — M47816 Spondylosis without myelopathy or radiculopathy, lumbar region: Secondary | ICD-10-CM | POA: Diagnosis not present

## 2023-10-08 DIAGNOSIS — Z6834 Body mass index (BMI) 34.0-34.9, adult: Secondary | ICD-10-CM | POA: Diagnosis not present

## 2023-10-08 DIAGNOSIS — Z6833 Body mass index (BMI) 33.0-33.9, adult: Secondary | ICD-10-CM | POA: Diagnosis not present

## 2023-10-09 ENCOUNTER — Telehealth: Payer: Self-pay

## 2023-10-09 NOTE — Telephone Encounter (Signed)
 Patient called with instructions: Per MD no action needed

## 2023-10-10 DIAGNOSIS — Z79899 Other long term (current) drug therapy: Secondary | ICD-10-CM | POA: Diagnosis not present

## 2023-10-21 ENCOUNTER — Other Ambulatory Visit: Payer: Self-pay

## 2023-10-22 ENCOUNTER — Telehealth: Payer: Self-pay | Admitting: Nutrition

## 2023-10-22 ENCOUNTER — Other Ambulatory Visit: Payer: Self-pay

## 2023-10-22 DIAGNOSIS — E1065 Type 1 diabetes mellitus with hyperglycemia: Secondary | ICD-10-CM

## 2023-10-22 MED ORDER — DEXCOM G7 SENSOR MISC: 3 refills | Status: DC

## 2023-10-22 NOTE — Telephone Encounter (Signed)
 Left message on my phone that she is needing the G7 sensors called in.  Has used up her G6 sensors.  Please call them in for her.

## 2023-10-23 ENCOUNTER — Other Ambulatory Visit: Payer: Self-pay

## 2023-10-23 ENCOUNTER — Telehealth: Payer: Self-pay | Admitting: Nutrition

## 2023-10-23 NOTE — Telephone Encounter (Signed)
 Patient says sensor prescription needs to go to [facility]

## 2023-10-24 ENCOUNTER — Ambulatory Visit: Admitting: Endocrinology

## 2023-10-24 ENCOUNTER — Encounter: Payer: Self-pay | Admitting: Endocrinology

## 2023-10-24 DIAGNOSIS — E1065 Type 1 diabetes mellitus with hyperglycemia: Secondary | ICD-10-CM | POA: Diagnosis not present

## 2023-10-24 MED ORDER — INSULIN ASPART 100 UNIT/ML IJ SOLN
INTRAMUSCULAR | 5 refills | Status: DC
Start: 2023-10-24 — End: 2024-02-12

## 2023-10-24 NOTE — Progress Notes (Signed)
 Outpatient Endocrinology Note Yvette Amadou Katzenstein, MD  10/24/23  Patient's Name: [name]    DOB: November 27, 1965    MRN: [id number]                                                    REASON OF VISIT: Follow up for type 1 diabetes mellitus  PCP: Joaquin Mulberry, MD  HISTORY OF PRESENT ILLNESS:   [name] is a 58 y.o. old female with past medical history listed below, is here for follow up for type 1 diabetes mellitus.    Pertinent Diabetes History: Patient was previously seen by Dr. Hubert Madden and Dr. Vertell Gory.  Patient was diagnosed with type 1 diabetes mellitus in 1977.  Patient had periodic episodes of diabetes ketoacidosis in the past.  Patient used to be on OmniPod Dash in the past was started in January 2021 was discontinued due to cost issue, around early 2023.  OmniPod 5 with Dexcom G6 was started in end of January 2025.  Chronic Diabetes Complications : Retinopathy: yes. Last ophthalmology exam was done on ?, following with ophthalmology regularly ?Aaron Aas  Retinal detachment. Nephropathy: Microalbuminuria, CKD 3A on ACE/ARB /losartan  Peripheral neuropathy: yes, on pregabalin , she has been seeing podiatry as well. Coronary artery disease: yes s/p CABG in 2019.  Stroke: yes She has gastroparesis treated with Zofran  as needed.  Also on Reglan  with good relief of symptoms.  Relevant comorbidities and cardiovascular risk factors: Obesity: yes Body mass index is 35.03 kg/m.  Hypertension: Yes  Hyperlipidemia : Yes, on statin   Current / Home Diabetic regimen includes:  OmniPod 5 with Dexcom G6 using NovoLog  U100  Pump setting  Basal rates ( 20.6 units/day) maximum basal rate 2.5 units/h 12 AM : 0.75 units/hr 10 AM: 1.0 10:30 AM: 1.2 4 PM:      0.75  Carb ratio 12 AM: 1:1  Sensitivity 12 AM : 1:35  Target 120  Active insulin  time 4 hours  She does not do actual carb counting she has been using carb number based on meal size.  Prior diabetic medications: OmniPod  Dash. OmniPod 4 insulin  pump since 07/07/2019, currently not using since early 2023. Basal bolus insulin  regimen.   Glycemic data /pump data:    OmniPod 5 Pump & Dexcom G6 Sensor Download (Reviewed and summarized below.)  Dates: April 18 to Oct 24, 2023, 14 days  Average total daily insulin :  33 units, Basal: 57%, Bolus: 43%  Sensor usage 74%.          Trends:  Random hyperglycemia related to late meal bolus, sometime no meal bolus and late meal bolus.  Generally hyperglycemia up to the range of 200-250.  One of the days she had persistent hyperglycemia on April 21 related to POD was not working, she had significant hyperglycemia for several hours.  Recently in last 3 4 days she has mostly acceptable blood sugar with normal blood sugar overnight and in between the meals.  Rare mild hypoglycemia related to late meal bolus.  Hypoglycemia: Patient has no hypoglycemic episodes. Patient has hypoglycemia awareness.  Factors modifying glucose control: 1.  Diabetic diet assessment: 3 meals a day.  2.  Staying active or exercising: No formal exercise.  3.  Medication compliance: compliant most of the time.  Interval history  Pump and CGM data as reviewed above.  She has  random hyperglycemia however in last 3 4 days she has mostly acceptable blood sugar.  She has been using insulin  meal bolus units rather than carb count lately.  She is still using Dexcom G6 however she is running out of G6 and going to refill Dexcom G7.  She has no other complaints today.  REVIEW OF SYSTEMS As per history of present illness.   PAST MEDICAL HISTORY: Past Medical History:  Diagnosis Date   AKI (acute kidney injury) (HCC)    Anemia, iron  deficiency    Anxiety and depression 05/18/2015   CAD (coronary artery disease), native coronary artery 01/11/2018   s/p CABG - Non-STEMI with severe three-vessel disease noted July 2019   Depression    Diabetic gastroparesis (HCC)    Diabetic neuropathy, type I  diabetes mellitus (HCC) 05/18/2015   DKA, type 1 (HCC) 03/18/2016   Essential hypertension    Gastroparesis    GERD (gastroesophageal reflux disease)    HLD (hyperlipidemia)    MDD (major depressive disorder), single episode, severe , no psychosis (HCC)    Tardive dyskinesia    Vitamin B12 deficiency 08/16/2015    PAST SURGICAL HISTORY: Past Surgical History:  Procedure Laterality Date   CARDIAC SURGERY     COLONOSCOPY  09/27/2014   at [facility]   CORONARY ARTERY BYPASS GRAFT N/A 01/14/2018   Procedure: CORONARY ARTERY BYPASS GRAFTING (CABG)X3, RIGHT AND LEFT SAPHENOUS VEIN HARVEST, MAMMARY TAKE DOWN. MAMMARY TO LAD, SVG TO PD, SVG TO DISTAL CIRC.;  Surgeon: Norita Beauvais, MD;  Location: [facility] OR;  Service: Open Heart Surgery;  Laterality: N/A;   ESOPHAGOGASTRODUODENOSCOPY  09/27/2014   at [facility], Dr Jerrye Mori. biospy neg for celiac, neg for H pylori.    EYE SURGERY     gailstones     INTRAMEDULLARY (IM) NAIL INTERTROCHANTERIC Right 05/10/2019   Procedure: INTRAMEDULLARY (IM) NAIL INTERTROCHANTRIC;  Surgeon: Jerlyn Moons, MD;  Location: [facility] ORS;  Service: Orthopedics;  Laterality: Right;   IR FLUORO GUIDE CV LINE RIGHT  02/01/2017   IR FLUORO GUIDE CV LINE RIGHT  03/06/2017   IR FLUORO GUIDE CV LINE RIGHT  03/25/2017   IR GENERIC HISTORICAL  01/24/2016   IR FLUORO GUIDE CV LINE RIGHT 01/24/2016 Darrell K Allred, PA-C [facility]INTER[facility]   IR GENERIC HISTORICAL  01/24/2016   IR US  GUIDE VASC ACCESS RIGHT 01/24/2016 Darrell K Allred, PA-C [facility]   IR US  GUIDE VASC ACCESS RIGHT  02/01/2017   IR US  GUIDE VASC ACCESS RIGHT  03/06/2017   IR US  GUIDE VASC ACCESS RIGHT  03/25/2017   IR US  GUIDE VASC ACCESS RIGHT  01/20/2019   IR VENIPUNCTURE 15YRS/OLDER BY MD  01/20/2019   LEFT HEART CATH AND CORONARY ANGIOGRAPHY N/A 01/07/2018   Procedure: LEFT HEART CATH AND CORONARY ANGIOGRAPHY;  Surgeon: Millicent Ally, MD;  Location: [facility] INVASIVE CV LAB;  Service: Cardiovascular;  Laterality: N/A;   POSTERIOR  VITRECTOMY AND MEMBRANE PEEL-LEFT EYE  09/28/2002   POSTERIOR VITRECTOMY AND MEMBRANE PEEL-RIGHT EYE  03/16/2002   RETINAL DETACHMENT SURGERY     TEE WITHOUT CARDIOVERSION N/A 01/14/2018   Procedure: TRANSESOPHAGEAL ECHOCARDIOGRAM (TEE);  Surgeon: Norita Beauvais, MD;  Location: [facility] OR;  Service: Open Heart Surgery;  Laterality: N/A;    ALLERGIES: Allergies  Allergen Reactions   Anesthetics, Amide Nausea And Vomiting   Chlorhexidine  Other (See Comments)    Unknown reaction   Penicillins Diarrhea, Nausea And Vomiting and Other (See Comments)   Enkaid [Encainide] Nausea And Vomiting  Reglan  [Metoclopramide ] Swelling and Other (See Comments)    Dystonia Muscle rigidity Slurred speech Tongue swelling   Subutex [Buprenorphine] Rash    FAMILY HISTORY:  Family History  Problem Relation Age of Onset   Cystic fibrosis Mother    Hypertension Father    Diabetes Brother    Hypertension Maternal Grandmother     SOCIAL HISTORY: Social History   Socioeconomic History   Marital status: Single    Spouse name: Not on file   Number of children: 1   Years of education: Not on file   Highest education level: Not on file  Occupational History   Occupation: Unemployed  Tobacco Use   Smoking status: Never   Smokeless tobacco: Never  Vaping Use   Vaping status: Never Used  Substance and Sexual Activity   Alcohol  use: No   Drug use: Not Currently    Types: Marijuana    Comment: none in 4 years   Sexual activity: Yes    Partners: Male    Birth control/protection: None  Other Topics Concern   Not on file  Social History Narrative   Not on file   Social Drivers of Health   Financial Resource Strain: Low Risk  (12/26/2022)   Overall Financial Resource Strain (CARDIA)    Difficulty of Paying Living Expenses: Not hard at all  Food Insecurity: No Food Insecurity (06/22/2023)   Hunger Vital Sign    Worried About Running Out of Food in the Last Year: Never true    Ran Out of Food in  the Last Year: Never true  Transportation Needs: No Transportation Needs (06/22/2023)   PRAPARE - Administrator, Civil Service (Medical): No    Lack of Transportation (Non-Medical): No  Physical Activity: Inactive (12/26/2022)   Exercise Vital Sign    Days of Exercise per Week: 0 days    Minutes of Exercise per Session: 0 min  Stress: No Stress Concern Present (12/26/2022)   [employer] of Occupational Health - Occupational Stress Questionnaire    Feeling of Stress : Only a little  Social Connections: Moderately Isolated (12/26/2022)   Social Connection and Isolation Panel [NHANES]    Frequency of Communication with Friends and Family: More than three times a week    Frequency of Social Gatherings with Friends and Family: Three times a week    Attends Religious Services: 1 to 4 times per year    Active Member of Clubs or Organizations: No    Attends Banker Meetings: Never    Marital Status: Never married    MEDICATIONS:  Current Outpatient Medications  Medication Sig Dispense Refill   acetaminophen  (TYLENOL ) 500 MG tablet Take 1,000 mg by mouth at bedtime as needed for mild pain.     ammonium lactate  (AMLACTIN) 12 % cream Apply 1 Application topically as needed for dry skin. 385 g 2   aspirin  EC 81 MG tablet Take 1 tablet (81 mg total) by mouth daily.     Continuous Glucose Sensor (DEXCOM G7 SENSOR) MISC Use to check glucose continuously, change sensor every 10 days 9 each 3   famotidine  (PEPCID ) 40 MG tablet      furosemide  (LASIX ) 40 MG tablet Take 1 tablet (40 mg total) by mouth daily. 90 tablet 3   Insulin  Disposable Pump (OMNIPOD 5 DEXG7G6 PODS GEN 5) MISC SMARTSIG:1 Each SUB-Q Every 3 Days     insulin  glargine (LANTUS ) 100 UNIT/ML injection Lantus  9 units bid 10 mL 1  losartan  (COZAAR ) 25 MG tablet Take 1 tablet (25 mg total) by mouth daily. Hold this medication if your systolic blood pressure ( top number) s less than 100. Please call your doctor is  your systolic blood pressure is less than 100 90 tablet 3   metoprolol  tartrate (LOPRESSOR ) 25 MG tablet Take 0.5 tablets (12.5 mg total) by mouth 2 (two) times daily. 45 tablet 3   ondansetron  (ZOFRAN ) 4 MG tablet Take 1 tablet (4 mg total) by mouth every 8 (eight) hours as needed for nausea or vomiting. 90 tablet 1   oxyCODONE -acetaminophen  (PERCOCET) 10-325 MG tablet Take 1 tablet by mouth 3 (three) times daily as needed.     polyethylene glycol (MIRALAX  / GLYCOLAX ) 17 g packet Take 17 g by mouth 2 (two) times daily. (Patient taking differently: Take 17 g by mouth daily as needed.) 14 each 0   pregabalin  (LYRICA ) 150 MG capsule Take 1 capsule (150 mg total) by mouth in the morning, at noon, and at bedtime. 270 capsule 3   rosuvastatin  (CRESTOR ) 20 MG tablet Take 1 tablet (20 mg total) by mouth daily. 90 tablet 3   insulin  aspart (NOVOLOG ) 100 UNIT/ML injection Use up to 50 units/day via insulin  pump. 30 mL 5   No current facility-administered medications for this visit.    PHYSICAL EXAM: Vitals:   10/24/23 1347  BP: (!) 110/50  Pulse: (!) 58  Resp: 20  SpO2: 98%  Weight: 185 lb 6.4 oz (84.1 kg)  Height: 5\' 1"  (1.549 m)     Body mass index is 35.03 kg/m.  Wt Readings from Last 3 Encounters:  10/24/23 185 lb 6.4 oz (84.1 kg)  10/04/23 184 lb (83.5 kg)  08/28/23 184 lb (83.5 kg)    General: Well developed, well nourished female in no apparent distress.  HEENT: AT/[place], no external lesions.  Eyes: Conjunctiva clear and no icterus. Neck: Neck supple  Lungs: Respirations not labored Neurologic: Alert, oriented, normal speech Extremities / Skin: Dry.    Diabetic Foot Exam - Simple   No data filed    LABS Reviewed Lab Results  Component Value Date   HGBA1C 8.3 (A) 08/28/2023   HGBA1C 9.8 (A) 05/20/2023   HGBA1C 10.9 (A) 02/14/2023   No results found for: "FRUCTOSAMINE" Lab Results  Component Value Date   CHOL 183 08/09/2021   HDL 57.50 08/09/2021   LDLCALC 105 (H)  08/09/2021   LDLDIRECT 96.0 07/04/2022   TRIG 104.0 08/09/2021   CHOLHDL 3 08/09/2021   Lab Results  Component Value Date   MICRALBCREAT 165 (H) 05/06/2023   MICRALBCREAT 17.9 01/02/2022   Lab Results  Component Value Date   CREATININE 1.53 (H) 06/23/2023   Lab Results  Component Value Date   GFR 51.61 (L) 07/04/2022    ASSESSMENT / PLAN  1. Uncontrolled type 1 diabetes mellitus with hyperglycemia (HCC)     Diabetes Mellitus type 1, complicated by CAD/nephropathy/neuropathy/retinopathy. - Diabetic status / severity: Uncontrolled.  Lab Results  Component Value Date   HGBA1C 8.3 (A) 08/28/2023    - Hemoglobin A1c goal : <6.5%  Patient has been on OmniPod 5 with Dexcom G6 since the end of January 2025.  Overall diabetes control is improving.  She has also been regularly following with diabetic educator.  About bolusing for each meal before eating.  Advised to use carb count test 2, 4 or 6 as follows based on meal size.  Advised to use bolus system into the pump.  - Medications: See  below.  I) continue OmniPod 5 with Dexcom G6, using NovoLog  U100.  She will be using Dexcom G7 in couple of weeks.  No change in the pump settings today. Discussed about timing of the meal bolus 10 to 15 minutes before eating.  Advised to increase the carb number 2 for small meal, 4 for regular meal and 6 for larger meal.  - Home glucose testing: CGM and check as needed.  Dexcom G7.  - Discussed/ Gave Hypoglycemia treatment plan.  # Consult : not required at this time.    # Annual urine for microalbuminuria/ creatinine ratio,microalbuminuria currently, continue ACE/ARB /losartan . Last  Lab Results  Component Value Date   MICRALBCREAT 165 (H) 05/06/2023    # Foot check nightly / neuropathy, continue pregabalin .  Discussed that improvement of diabetes may help with improving neuropathic symptoms.  On pregabalin  150 mg 3 times a day.  # Patient has diabetic retinopathy, advised to  follow-up with ophthalmology regularly..   - Diet: Make healthy diabetic food choices - Life style / activity / exercise: Discussed.  2. Blood pressure  -  BP Readings from Last 1 Encounters:  10/24/23 (!) 110/50    - Control is in target.  - No change in current plans.  3. Lipid status / Hyperlipidemia - Last  Lab Results  Component Value Date   LDLCALC 105 (H) 08/09/2021   - Continue rosuvastatin  20 mg daily.  Managed by primary care provider /cardiology.  Diagnoses and all orders for this visit:  Uncontrolled type 1 diabetes mellitus with hyperglycemia (HCC) -     insulin  aspart (NOVOLOG ) 100 UNIT/ML injection; Use up to 50 units/day via insulin  pump.    DISPOSITION Follow up in clinic in 3 months suggested.   All questions answered and patient verbalized understanding of the plan.  Yvette Jocob Dambach, MD [facility] Endocrinology [facility] [address] [place], [address] [place], [place] [address] Phone # [phone]  At least part of this note was generated using voice recognition software. Inadvertent word errors may have occurred, which were not recognized during the proofreading process.

## 2023-10-25 ENCOUNTER — Encounter: Payer: Self-pay | Admitting: Endocrinology

## 2023-11-04 ENCOUNTER — Ambulatory Visit: Payer: Medicare HMO | Admitting: Family Medicine

## 2023-12-05 ENCOUNTER — Telehealth: Payer: Self-pay | Admitting: Dietician

## 2023-12-05 NOTE — Telephone Encounter (Signed)
 Patient states that she needs training on the Dexcom G7.  She uses the Omnipos 5 insulin  pump.  Discussed for her to bring all of her user names and passwords to the appointment, a G7 sensor, POD that is compatible with the G7 and insulin  in the event she needs to start a new POD.  [name], RD, LDN, CDCES, DipACLM

## 2023-12-11 ENCOUNTER — Telehealth: Payer: Self-pay | Admitting: Nutrition

## 2023-12-11 ENCOUNTER — Ambulatory Visit: Admitting: Nutrition

## 2023-12-11 NOTE — Telephone Encounter (Signed)
 LVM to call me to schedule training for Dexcom with pump.

## 2023-12-23 ENCOUNTER — Telehealth: Payer: Self-pay | Admitting: Family Medicine

## 2023-12-23 NOTE — Telephone Encounter (Signed)
 Contacted pt no vm to leave a message! To confirmed appt

## 2023-12-24 ENCOUNTER — Ambulatory Visit: Admitting: Family Medicine

## 2023-12-31 ENCOUNTER — Ambulatory Visit: Payer: Medicare HMO | Attending: Family Medicine

## 2024-01-02 ENCOUNTER — Ambulatory Visit: Payer: Self-pay

## 2024-01-02 NOTE — Telephone Encounter (Addendum)
 pt is having problems out of ear needs appt. Soon like monday   Second attempt to contact patient but no answer. Voicemail is full-unable to leave a message.

## 2024-01-02 NOTE — Telephone Encounter (Signed)
 pt is having problems out of ear needs appt. Soon like monday   Second attempt to contact patient but no answer. Voicemail is full-unable to leave a message.  Third attempt made; vm full

## 2024-01-02 NOTE — Telephone Encounter (Signed)
 FYI Only or Action Required?: Action required by provider: request for appointment.  Patient was last seen in primary care on 05/06/2023 by Delbert Clam, MD.  Called Nurse Triage reporting Ear Problem.  Symptoms began several days ago.  Interventions attempted: Nothing.  Symptoms are: unchanged.  Triage Disposition: See PCP Within 2 Weeks  Patient/caregiver understands and will follow disposition?: Unsure    Reason for Disposition  [1] Earwax problem AND [2] no improvement using Care Advice  Answer Assessment - Initial Assessment Questions 1. LOCATION: Which ear is involved?       R ear  2. SENSATION: Describe how the ear feels. (e.g., stuffy, full, plugged).      Got stopped up , some hearing loss 3. ONSET:  When did the ear symptoms start?       Friday 4. PAIN: Do you also have an earache? If Yes, ask: How bad is it? (Scale 0-10; none, mild, moderate or severe)     Denies pain 5. CAUSE: What do you think is causing the ear congestion? (e.g., common cold, nasal allergies, recent flight, recent snorkeling)     pt reports hx of ear was impaction that needed irrigation, pt feels needed again 6. OTHER SYMPTOMS: Do you have any other symptoms? (e.g., ear drainage, hay fever symptoms such as sneezing or a clear nasal discharge; cold symptoms such as a cough or runny nose)     Denies no more than usual... I have allergies 7. PREGNANCY: Is there any chance you are pregnant? When was your last menstrual period?     N/a  Answer Assessment - Initial Assessment Questions 1. LOCATION: Which ear is involved?      R ear 2. SYMPTOMS: What are the main symptoms? (e.g., fullness, decreased hearing, itching, discomfort)     Decreased hearing 3. ONSET: When did the  sx  start?     friday 4. PAIN: Is there any earache? How bad is it?  (Scale 0-10; or none, mild, moderate, severe)     denies 5. OBJECTS: Do you use cotton swabs (Q-tips) in your ear? Have  you put anything else in your ear?     Does endorse using Q tips in outer ear 6. EARWAX HISTORY: Have you had problems with earwax before? If Yes, ask: What did you do the last time?     Yes, had irrigation    [facility] called [facility] to see if appt available -- Zara (?) notified of disposition, patient transferred for scheduling.  Protocols used: Ear - Congestion-A-AH, Earwax-A-AH

## 2024-01-02 NOTE — Telephone Encounter (Signed)
Unable to reach patient by phone. Unable to leave voicemail.

## 2024-01-02 NOTE — Telephone Encounter (Signed)
 First attempt; no answer and mailbox is full.    pt is having problems out of ear needs appt. Soon like monday

## 2024-01-07 ENCOUNTER — Ambulatory Visit: Attending: Family Medicine

## 2024-01-07 ENCOUNTER — Telehealth: Payer: Self-pay | Admitting: Family Medicine

## 2024-01-07 VITALS — Ht 61.0 in | Wt 184.0 lb

## 2024-01-07 DIAGNOSIS — Z0000 Encounter for general adult medical examination without abnormal findings: Secondary | ICD-10-CM | POA: Diagnosis not present

## 2024-01-07 NOTE — Progress Notes (Signed)
 Because this visit was a virtual/telehealth visit,  certain criteria was not obtained, such a blood pressure, CBG if applicable, and timed get up and go. Any medications not marked as taking were not mentioned during the medication reconciliation part of the visit. Any vitals not documented were not able to be obtained due to this being a telehealth visit or patient was unable to self-report a recent blood pressure reading due to a lack of equipment at home via telehealth. Vitals that have been documented are verbally provided by the patient.   Subjective:   [name] is a 58 y.o. who presents for a Medicare Wellness preventive visit.  As a reminder, Annual Wellness Visits don't include a physical exam, and some assessments may be limited, especially if this visit is performed virtually. We may recommend an in-person follow-up visit with your provider if needed.  Visit Complete: Virtual I connected with  [name] on 01/07/24 by a audio enabled telemedicine application and verified that I am speaking with the correct person using two identifiers.  Patient Location: Other:  car  Provider Location: Office/Clinic  I discussed the limitations of evaluation and management by telemedicine. The patient expressed understanding and agreed to proceed.  Vital Signs: Because this visit was a virtual/telehealth visit, some criteria may be missing or patient reported. Any vitals not documented were not able to be obtained and vitals that have been documented are patient reported.  VideoDeclined- This patient declined Librarian, academic. Therefore the visit was completed with audio only.  Persons Participating in Visit: Patient.  AWV Questionnaire: No: Patient Medicare AWV questionnaire was not completed prior to this visit.  Cardiac Risk Factors include: advanced age (>72men, >36 women);diabetes mellitus;dyslipidemia;family history of premature cardiovascular  disease;hypertension;obesity (BMI >30kg/m2);sedentary lifestyle     Objective:    Today's Vitals   01/07/24 1452  Weight: 184 lb (83.5 kg)  Height: 5' 1 (1.549 m)  PainSc: 0-No pain   Body mass index is 34.77 kg/m.     01/07/2024    2:55 PM 06/22/2023    5:00 PM 05/23/2023   12:20 AM 12/26/2022    6:17 PM 12/10/2022   11:05 AM 02/13/2022    4:17 PM 10/03/2021    4:24 PM  Advanced Directives  Does Patient Have a Medical Advance Directive? No No No No No No No  Would patient like information on creating a medical advance directive? No - Patient declined No - Patient declined No - Patient declined Yes (MAU/Ambulatory/Procedural Areas - Information given) No - Patient declined Yes (ED - Information included in AVS) No - Patient declined    Current Medications (verified) Outpatient Encounter Medications as of 01/07/2024  Medication Sig   acetaminophen  (TYLENOL ) 500 MG tablet Take 1,000 mg by mouth at bedtime as needed for mild pain.   ammonium lactate  (AMLACTIN) 12 % cream Apply 1 Application topically as needed for dry skin.   aspirin  EC 81 MG tablet Take 1 tablet (81 mg total) by mouth daily.   Continuous Glucose Sensor (DEXCOM G7 SENSOR) MISC Use to check glucose continuously, change sensor every 10 days   famotidine  (PEPCID ) 40 MG tablet    furosemide  (LASIX ) 40 MG tablet Take 1 tablet (40 mg total) by mouth daily.   insulin  aspart (NOVOLOG ) 100 UNIT/ML injection Use up to 50 units/day via insulin  pump.   Insulin  Disposable Pump (OMNIPOD 5 DEXG7G6 PODS GEN 5) MISC SMARTSIG:1 Each SUB-Q Every 3 Days   insulin  glargine (LANTUS ) 100 UNIT/ML  injection Lantus  9 units bid   losartan  (COZAAR ) 25 MG tablet Take 1 tablet (25 mg total) by mouth daily. Hold this medication if your systolic blood pressure ( top number) s less than 100. Please call your doctor is your systolic blood pressure is less than 100   metoprolol  tartrate (LOPRESSOR ) 25 MG tablet Take 0.5 tablets (12.5 mg total) by mouth  2 (two) times daily.   ondansetron  (ZOFRAN ) 4 MG tablet Take 1 tablet (4 mg total) by mouth every 8 (eight) hours as needed for nausea or vomiting.   oxyCODONE -acetaminophen  (PERCOCET) 10-325 MG tablet Take 1 tablet by mouth 3 (three) times daily as needed.   polyethylene glycol (MIRALAX  / GLYCOLAX ) 17 g packet Take 17 g by mouth 2 (two) times daily. (Patient taking differently: Take 17 g by mouth daily as needed.)   pregabalin  (LYRICA ) 150 MG capsule Take 1 capsule (150 mg total) by mouth in the morning, at noon, and at bedtime.   rosuvastatin  (CRESTOR ) 20 MG tablet Take 1 tablet (20 mg total) by mouth daily.   No facility-administered encounter medications on file as of 01/07/2024.    Allergies (verified) Anesthetics, amide; Chlorhexidine ; Penicillins; Enkaid [encainide]; Reglan  [metoclopramide ]; and Subutex [buprenorphine]   History: Past Medical History:  Diagnosis Date   AKI (acute kidney injury) (HCC)    Anemia, iron  deficiency    Anxiety and depression 05/18/2015   CAD (coronary artery disease), native coronary artery 01/11/2018   s/p CABG - Non-STEMI with severe three-vessel disease noted July 2019   Depression    Diabetic gastroparesis (HCC)    Diabetic neuropathy, type I diabetes mellitus (HCC) 05/18/2015   DKA, type 1 (HCC) 03/18/2016   Essential hypertension    Gastroparesis    GERD (gastroesophageal reflux disease)    HLD (hyperlipidemia)    MDD (major depressive disorder), single episode, severe , no psychosis (HCC)    Tardive dyskinesia    Vitamin B12 deficiency 08/16/2015   Past Surgical History:  Procedure Laterality Date   CARDIAC SURGERY     COLONOSCOPY  09/27/2014   at [facility]   CORONARY ARTERY BYPASS GRAFT N/A 01/14/2018   Procedure: CORONARY ARTERY BYPASS GRAFTING (CABG)X3, RIGHT AND LEFT SAPHENOUS VEIN HARVEST, MAMMARY TAKE DOWN. MAMMARY TO LAD, SVG TO PD, SVG TO DISTAL CIRC.;  Surgeon: Army Dallas NOVAK, MD;  Location: [facility] OR;  Service: Open Heart Surgery;   Laterality: N/A;   ESOPHAGOGASTRODUODENOSCOPY  09/27/2014   at [facility], Dr Gretel Crigler. biospy neg for celiac, neg for H pylori.    EYE SURGERY     gailstones     INTRAMEDULLARY (IM) NAIL INTERTROCHANTERIC Right 05/10/2019   Procedure: INTRAMEDULLARY (IM) NAIL INTERTROCHANTRIC;  Surgeon: Leora Lynwood SAUNDERS, MD;  Location: [facility] ORS;  Service: Orthopedics;  Laterality: Right;   IR FLUORO GUIDE CV LINE RIGHT  02/01/2017   IR FLUORO GUIDE CV LINE RIGHT  03/06/2017   IR FLUORO GUIDE CV LINE RIGHT  03/25/2017   IR GENERIC HISTORICAL  01/24/2016   IR FLUORO GUIDE CV LINE RIGHT 01/24/2016 Darrell K Allred, PA-C [facility]INTER[facility]   IR GENERIC HISTORICAL  01/24/2016   IR US  GUIDE VASC ACCESS RIGHT 01/24/2016 Darrell K Allred, PA-C [facility]   IR US  GUIDE VASC ACCESS RIGHT  02/01/2017   IR US  GUIDE VASC ACCESS RIGHT  03/06/2017   IR US  GUIDE VASC ACCESS RIGHT  03/25/2017   IR US  GUIDE VASC ACCESS RIGHT  01/20/2019   IR VENIPUNCTURE 65YRS/OLDER BY MD  01/20/2019  LEFT HEART CATH AND CORONARY ANGIOGRAPHY N/A 01/07/2018   Procedure: LEFT HEART CATH AND CORONARY ANGIOGRAPHY;  Surgeon: Burnard Debby LABOR, MD;  Location: [facility] INVASIVE CV LAB;  Service: Cardiovascular;  Laterality: N/A;   POSTERIOR VITRECTOMY AND MEMBRANE PEEL-LEFT EYE  09/28/2002   POSTERIOR VITRECTOMY AND MEMBRANE PEEL-RIGHT EYE  03/16/2002   RETINAL DETACHMENT SURGERY     TEE WITHOUT CARDIOVERSION N/A 01/14/2018   Procedure: TRANSESOPHAGEAL ECHOCARDIOGRAM (TEE);  Surgeon: Army Dallas NOVAK, MD;  Location: [facility] OR;  Service: Open Heart Surgery;  Laterality: N/A;   Family History  Problem Relation Age of Onset   Cystic fibrosis Mother    Hypertension Father    Diabetes Brother    Hypertension Maternal Grandmother    Social History   Socioeconomic History   Marital status: Single    Spouse name: Not on file   Number of children: 1   Years of education: Not on file   Highest education level: Not on file  Occupational History   Occupation: Unemployed   Tobacco Use   Smoking status: Never   Smokeless tobacco: Never  Vaping Use   Vaping status: Never Used  Substance and Sexual Activity   Alcohol  use: No   Drug use: Not Currently    Types: Marijuana    Comment: none in 4 years   Sexual activity: Yes    Partners: Male    Birth control/protection: None  Other Topics Concern   Not on file  Social History Narrative   Not on file   Social Drivers of Health   Financial Resource Strain: Low Risk  (01/07/2024)   Overall Financial Resource Strain (CARDIA)    Difficulty of Paying Living Expenses: Not hard at all  Food Insecurity: No Food Insecurity (01/07/2024)   Hunger Vital Sign    Worried About Running Out of Food in the Last Year: Never true    Ran Out of Food in the Last Year: Never true  Transportation Needs: No Transportation Needs (01/07/2024)   PRAPARE - Administrator, Civil Service (Medical): No    Lack of Transportation (Non-Medical): No  Physical Activity: Inactive (01/07/2024)   Exercise Vital Sign    Days of Exercise per Week: 0 days    Minutes of Exercise per Session: 0 min  Stress: No Stress Concern Present (01/07/2024)   [employer] of Occupational Health - Occupational Stress Questionnaire    Feeling of Stress: Not at all  Social Connections: Moderately Isolated (01/07/2024)   Social Connection and Isolation Panel    Frequency of Communication with Friends and Family: More than three times a week    Frequency of Social Gatherings with Friends and Family: Three times a week    Attends Religious Services: 1 to 4 times per year    Active Member of Clubs or Organizations: No    Attends Banker Meetings: Never    Marital Status: Never married    Tobacco Counseling Counseling given: Not Answered    Clinical Intake:  Pre-visit preparation completed: Yes  Pain : No/denies pain Pain Score: 0-No pain     BMI - recorded: 34.77 Nutritional Status: BMI > 30  Obese Nutritional Risks:  None Diabetes: No  Lab Results  Component Value Date   HGBA1C 8.3 (A) 08/28/2023   HGBA1C 9.8 (A) 05/20/2023   HGBA1C 10.9 (A) 02/14/2023     How often do you need to have someone help you when you read instructions, pamphlets, or other written materials from  your doctor or pharmacy?: 1 - Never  Interpreter Needed?: No  Information entered by :: Almer Bushey N. Loreto Loescher, LPN.   Activities of Daily Living     01/07/2024    2:57 PM 06/22/2023    5:00 PM  In your present state of health, do you have any difficulty performing the following activities:  Hearing? 1 0  Vision? 0 0  Difficulty concentrating or making decisions? 0 0  Walking or climbing stairs? 1   Dressing or bathing? 0   Doing errands, shopping? 0 1  Preparing Food and eating ? N   Using the Toilet? N   In the past six months, have you accidently leaked urine? N   Do you have problems with loss of bowel control? N   Managing your Medications? N   Managing your Finances? N   Housekeeping or managing your Housekeeping? N     Patient Care Team: Delbert Clam, MD as PCP - General (Family Medicine) Nahser, Aleene PARAS, MD as PCP - Cardiology (Cardiology)  I have updated your Care Teams any recent Medical Services you may have received from other providers in the past year.     Assessment:   This is a routine wellness examination for Marye.  Hearing/Vision screen Hearing Screening - Comments:: Some hearing difficulties in right ear..  Vision Screening - Comments:: Wears reading glasses - not up to date with routine eye exams.    Goals Addressed             This Visit's Progress    Patient Stated: 01/07/24       To walk more with my cane versus using the walker. To have better control of my diabetes by continuing to wear my Dexcom G& sensor and Omni pod Pump.       Depression Screen     01/07/2024    2:57 PM 05/06/2023   12:06 PM 12/26/2022    6:15 PM 10/31/2022    4:33 PM 02/13/2022    4:18 PM  09/06/2021   10:04 AM 05/06/2020    2:26 PM  PHQ 2/9 Scores  PHQ - 2 Score 0 2 2 2  0 0 0  PHQ- 9 Score 0 3 4 4  2      Fall Risk     01/07/2024    2:56 PM 05/06/2023   11:04 AM 12/26/2022    6:17 PM 02/13/2022    4:17 PM 09/06/2021    9:59 AM  Fall Risk   Falls in the past year? 0 0 0 0 0  Number falls in past yr: 0 0 0 0 0  Injury with Fall? 0 0 0 0 0  Risk for fall due to : No Fall Risks No Fall Risks No Fall Risks History of fall(s)   Follow up Falls evaluation completed Falls evaluation completed Falls prevention discussed;Education provided;Falls evaluation completed Education provided       Data saved with a previous flowsheet row definition    MEDICARE RISK AT HOME:  Medicare Risk at Home Any stairs in or around the home?: No If so, are there any without handrails?: No Home free of loose throw rugs in walkways, pet beds, electrical cords, etc?: Yes Adequate lighting in your home to reduce risk of falls?: Yes Life alert?: No Use of a cane, walker or w/c?: Yes (WALKER) Grab bars in the bathroom?: No Shower chair or bench in shower?: No Elevated toilet seat or a handicapped toilet?: No  TIMED UP AND GO:  Was the test performed?  No  Cognitive Function: Declined/Normal: No cognitive concerns noted by patient or family. Patient alert, oriented, able to answer questions appropriately and recall recent events. No signs of memory loss or confusion.    01/07/2024    2:57 PM 02/13/2022    4:22 PM  MMSE - Mini Mental State Exam  Not completed: Unable to complete Unable to complete        01/07/2024    3:03 PM 12/26/2022    6:17 PM 02/13/2022    4:22 PM  6CIT Screen  What Year? 0 points 0 points 0 points  What month? 0 points 0 points 0 points  What time? 0 points 0 points 0 points  Count back from 20 0 points 0 points 0 points  Months in reverse 0 points 0 points 0 points  Repeat phrase 0 points 0 points 0 points  Total Score 0 points 0 points 0 points     Immunizations Immunization History  Administered Date(s) Administered   Influenza Split 03/25/2012   Influenza, Quadrivalent, Recombinant, Inj, Pf 04/09/2015, 04/07/2016, 04/03/2017, 04/15/2018, 04/26/2020   Influenza, Seasonal, Injecte, Preservative Fre 04/08/2014, 05/06/2023   Influenza,inj,Quad PF,6+ Mos 04/09/2015, 04/07/2016, 04/03/2017, 04/15/2018, 04/26/2020   Influenza-Unspecified 03/30/2015   PFIZER Comirnaty(Gray Top)Covid-19 Tri-Sucrose Vaccine 09/30/2020   PFIZER(Purple Top)SARS-COV-2 Vaccination 03/14/2020, 04/04/2020   PPD Test 04/04/2017   Pneumococcal-Unspecified 04/29/2014    Screening Tests Health Maintenance  Topic Date Due   Hepatitis C Screening  Never done   DTaP/Tdap/Td (1 - Tdap) Never done   Pneumococcal Vaccine 39-54 Years old (1 of 2 - PCV) 05/17/1986   Hepatitis B Vaccines (1 of 3 - 19+ 3-dose series) Never done   Zoster Vaccines- Shingrix (1 of 2) Never done   Colonoscopy  Never done   MAMMOGRAM  Never done   COVID-19 Vaccine (4 - 2024-25 season) 02/24/2023   Cervical Cancer Screening (HPV/Pap Cotest)  11/05/2023   INFLUENZA VACCINE  01/24/2024   HEMOGLOBIN A1C  02/28/2024   Diabetic kidney evaluation - Urine ACR  05/05/2024   FOOT EXAM  05/05/2024   OPHTHALMOLOGY EXAM  05/09/2024   Diabetic kidney evaluation - eGFR measurement  06/22/2024   Medicare Annual Wellness (AWV)  01/06/2025   HIV Screening  Completed   HPV VACCINES  Aged Out   Meningococcal B Vaccine  Aged Out    Health Maintenance  Health Maintenance Due  Topic Date Due   Hepatitis C Screening  Never done   DTaP/Tdap/Td (1 - Tdap) Never done   Pneumococcal Vaccine 46-4 Years old (1 of 2 - PCV) 05/17/1986   Hepatitis B Vaccines (1 of 3 - 19+ 3-dose series) Never done   Zoster Vaccines- Shingrix (1 of 2) Never done   Colonoscopy  Never done   MAMMOGRAM  Never done   COVID-19 Vaccine (4 - 2024-25 season) 02/24/2023   Cervical Cancer Screening (HPV/Pap Cotest)  11/05/2023    Health Maintenance Items Addressed: See Nurse Notes at the end of this note  Additional Screening:  Vision Screening: Recommended annual ophthalmology exams for early detection of glaucoma and other disorders of the eye. Would you like a referral to an eye doctor? No    Dental Screening: Recommended annual dental exams for proper oral hygiene  Community Resource Referral / Chronic Care Management: CRR required this visit?  No   CCM required this visit?  No   Plan:    I have personally reviewed and noted the following in the patient's chart:  Medical and social history Use of alcohol , tobacco or illicit drugs  Current medications and supplements including opioid prescriptions. Patient is currently taking opioid prescriptions. Information provided to patient regarding non-opioid alternatives. Patient advised to discuss non-opioid treatment plan with their provider. Functional ability and status Nutritional status Physical activity Advanced directives List of other physicians Hospitalizations, surgeries, and ER visits in previous 12 months Vitals Screenings to include cognitive, depression, and falls Referrals and appointments  In addition, I have reviewed and discussed with patient certain preventive protocols, quality metrics, and best practice recommendations. A written personalized care plan for preventive services as well as general preventive health recommendations were provided to patient.   Roz LOISE Fuller, LPN   [id number]/84/[id number]   After Visit Summary: (Declined) Due to this being a telephonic visit, with patients personalized plan was offered to patient but patient Declined AVS at this time   Notes: Patient stated that she is aware of current care gaps. Patient declined vaccines.  Patient stated that she will schedule her mammogram and pap smear.

## 2024-01-07 NOTE — Telephone Encounter (Signed)
 Called pt to confirm appt. Pt will be present.

## 2024-01-07 NOTE — Patient Instructions (Signed)
 [name] , Thank you for taking time out of your busy schedule to complete your Annual Wellness Visit with me. I enjoyed our conversation and look forward to speaking with you again next year. I, as well as your care team,  appreciate your ongoing commitment to your health goals. Please review the following plan we discussed and let me know if I can assist you in the future. Your Game plan/ To Do List    Referrals: If you haven't heard from the office you've been referred to, please reach out to them at the phone provided.   Follow up Visits: Next Medicare AWV with our clinical staff: 01/12/25 at 4:20 p.m. phone visit with Nurse Health Advisor   Have you seen your provider in the last 6 months (3 months if uncontrolled diabetes)? Yes Next Office Visit with your provider: 01/29/2024 at 3:10 p.m. office visit with Dr. Delbert  Clinician Recommendations:  Aim for 30 minutes of exercise or brisk walking, 6-8 glasses of water, and 5 servings of fruits and vegetables each day.       This is a list of the screening recommended for you and due dates:  Health Maintenance  Topic Date Due   Hepatitis C Screening  Never done   DTaP/Tdap/Td vaccine (1 - Tdap) Never done   Pneumococcal Vaccination (1 of 2 - PCV) 05/17/1986   Hepatitis B Vaccine (1 of 3 - 19+ 3-dose series) Never done   Zoster (Shingles) Vaccine (1 of 2) Never done   Colon Cancer Screening  Never done   Mammogram  Never done   COVID-19 Vaccine (4 - 2024-25 season) 02/24/2023   Pap with HPV screening  11/05/2023   Flu Shot  01/24/2024   Hemoglobin A1C  02/28/2024   Yearly kidney health urinalysis for diabetes  05/05/2024   Complete foot exam   05/05/2024   Eye exam for diabetics  05/09/2024   Yearly kidney function blood test for diabetes  06/22/2024   Medicare Annual Wellness Visit  01/06/2025   HIV Screening  Completed   HPV Vaccine  Aged Out   Meningitis B Vaccine  Aged Out    Advanced directives: (Declined) Advance directive  discussed with you today. Even though you declined this today, please call our office should you change your mind, and we can give you the proper paperwork for you to fill out. Advance Care Planning is important because it:  [x]  Makes sure you receive the medical care that is consistent with your values, goals, and preferences  [x]  It provides guidance to your family and loved ones and reduces their decisional burden about whether or not they are making the right decisions based on your wishes.  Follow the link provided in your after visit summary or read over the paperwork we have mailed to you to help you started getting your Advance Directives in place. If you need assistance in completing these, please reach out to us  so that we can help you!  See attachments for Preventive Care and Fall Prevention Tips.

## 2024-01-08 ENCOUNTER — Ambulatory Visit: Attending: Family Medicine | Admitting: Family Medicine

## 2024-01-08 ENCOUNTER — Encounter: Payer: Self-pay | Admitting: Family Medicine

## 2024-01-08 VITALS — BP 137/74 | HR 58 | Wt 185.0 lb

## 2024-01-08 DIAGNOSIS — H6120 Impacted cerumen, unspecified ear: Secondary | ICD-10-CM

## 2024-01-08 DIAGNOSIS — H6123 Impacted cerumen, bilateral: Secondary | ICD-10-CM | POA: Diagnosis not present

## 2024-01-08 DIAGNOSIS — R234 Changes in skin texture: Secondary | ICD-10-CM | POA: Diagnosis not present

## 2024-01-08 DIAGNOSIS — H10019 Acute follicular conjunctivitis, unspecified eye: Secondary | ICD-10-CM | POA: Diagnosis not present

## 2024-01-08 NOTE — Patient Instructions (Signed)
Ear Irrigation Ear irrigation is a procedure to wash dirt and wax out of your ear canal. It's also called lavage. You may need this if you're having trouble hearing because of wax in your ear. You may also have it done as part of the treatment for an ear infection.  Getting wax and dirt out of your ear can help ear drops work better. Tell a health care provider about: Any allergies you have. All medicines you're taking, including vitamins, herbs, eye drops, creams, and over-the-counter medicines. Any problems you or family members have had with anesthesia. Any bleeding problems you have. Any surgeries you've had. This includes any ear surgeries. Any medical conditions you have, such as any problems with your ear. Whether you're pregnant or may be pregnant. What are the risks? Your health care provider will talk with you about risks. These may include: Infection. Pain. Loss of hearing. Fluid and debris being pushed into your middle ear. This can happen if there are holes in your eardrum. The procedure not working. Trauma to your ear. Feeling dizzy, light-headed, or nauseous. What happens before the procedure? You'll talk with your provider about the procedure and plan. You may be given ear drops to put in your ear 15-20 minutes before the procedure. This helps loosen the wax. What happens during the procedure?  A syringe will be filled with water or a saline solution. Saline is made of salt and water. The syringe will be gently put into your ear. The fluid will be used to wash out wax and other debris. The procedure may vary among providers and hospitals. What can I expect after the procedure? Follow the instructions given to you by your provider. Follow these instructions at home: Using ear irrigation kits In some cases, you can use an ear irrigation kit at home. Ask your provider if this is an option for you. Use the kit only as told by your provider. Read the instructions on the  package. Follow the directions for using the syringe. Use water that's at room temperature. Do not use an ear irrigation kit if: You have diabetes. This can make you more likely to get an infection. You have a hole or tear in your eardrum. You have tubes in your ears. You've had ear surgery before. You've been told not to irrigate your ears. Cleaning your ears  Clean the outside of your ear with a soft washcloth each day. If told by your provider, use a few drops of baby oil, mineral oil, glycerin, hydrogen peroxide, or earwax softening drops. Do not use cotton swabs to clean your ears. These can push wax down into the ear canal. Do not put things into your ears to try to get rid of wax. This includes ear candles. General instructions Take over-the-counter and prescription medicines only as told by your provider. If you were prescribed antibiotics, use them as told by your provider. Do not stop using the antibiotic even if you start to feel better. Keep your ear clean and dry as told by your provider. See your provider at least once a year to have your ears and hearing checked. Contact a health care provider if: Your hearing isn't getting better. Your hearing is getting worse. You have pain or redness in your ear. You feel dizzy. You have ringing in your ears. You have nausea or vomiting. This information is not intended to replace advice given to you by your health care provider. Make sure you discuss any questions you have with  your health care provider. Document Revised: 08/23/2022 Document Reviewed: 08/23/2022 Elsevier Patient Education  2024 [employer].

## 2024-01-08 NOTE — Progress Notes (Signed)
 Subjective:  Patient ID: [name], female    DOB: [birth year]  Age: 58 y.o. MRN: [id number]  CC: Medical Management of Chronic Issues (Possible wax build up )     Discussed the use of AI scribe software for clinical note transcription with the patient, who gave verbal consent to proceed.  History of Present Illness [name] is a 58 year old female  with a history of Type 1 diabetes mellitus (A1c 10.9 managed by endocrine), diabetic neuropathy, diabetic gastroparesis, hypertension, anemia, anxiety and depression, multiple ED visits and hospitalizations for diabetic gastroparesis, CAD s/p CABG x3 (LIMA to LAD, SVG to PDA, SVG to OM) in 12/2017, R femoral fracture (s/p intramedullary nail in 04/2019). who presents with right ear blockage after using a Q-tip.  She has experienced blockage in her right ear for the past week after accidentally inserting a Q-tip. There is no associated pain or water exposure. She is concerned about the ear being clogged and is open to having it cleaned out. She has not experienced similar symptoms in the past.  She reports white crusting in her eyes upon waking for the past three days without itching or redness. She attributes her runny nose to allergies. There is no ear pain, itching, or redness in the eyes.    Past Medical History:  Diagnosis Date   AKI (acute kidney injury) (HCC)    Anemia, iron  deficiency    Anxiety and depression 05/18/2015   CAD (coronary artery disease), native coronary artery 01/11/2018   s/p CABG - Non-STEMI with severe three-vessel disease noted July 2019   Depression    Diabetic gastroparesis (HCC)    Diabetic neuropathy, type I diabetes mellitus (HCC) 05/18/2015   DKA, type 1 (HCC) 03/18/2016   Essential hypertension    Gastroparesis    GERD (gastroesophageal reflux disease)    HLD (hyperlipidemia)    MDD (major depressive disorder), single episode, severe , no psychosis (HCC)    Tardive dyskinesia    Vitamin B12  deficiency 08/16/2015    Past Surgical History:  Procedure Laterality Date   CARDIAC SURGERY     COLONOSCOPY  09/27/2014   at [facility]   CORONARY ARTERY BYPASS GRAFT N/A 01/14/2018   Procedure: CORONARY ARTERY BYPASS GRAFTING (CABG)X3, RIGHT AND LEFT SAPHENOUS VEIN HARVEST, MAMMARY TAKE DOWN. MAMMARY TO LAD, SVG TO PD, SVG TO DISTAL CIRC.;  Surgeon: Army Dallas NOVAK, MD;  Location: [facility] OR;  Service: Open Heart Surgery;  Laterality: N/A;   ESOPHAGOGASTRODUODENOSCOPY  09/27/2014   at [facility], Dr Gretel Crigler. biospy neg for celiac, neg for H pylori.    EYE SURGERY     gailstones     INTRAMEDULLARY (IM) NAIL INTERTROCHANTERIC Right 05/10/2019   Procedure: INTRAMEDULLARY (IM) NAIL INTERTROCHANTRIC;  Surgeon: Leora Lynwood SAUNDERS, MD;  Location: [facility] ORS;  Service: Orthopedics;  Laterality: Right;   IR FLUORO GUIDE CV LINE RIGHT  02/01/2017   IR FLUORO GUIDE CV LINE RIGHT  03/06/2017   IR FLUORO GUIDE CV LINE RIGHT  03/25/2017   IR GENERIC HISTORICAL  01/24/2016   IR FLUORO GUIDE CV LINE RIGHT 01/24/2016 Darrell K Allred, PA-C [facility]   IR GENERIC HISTORICAL  01/24/2016   IR US  GUIDE VASC ACCESS RIGHT 01/24/2016 Darrell K Allred, PA-C [facility]   IR US  GUIDE VASC ACCESS RIGHT  02/01/2017   IR US  GUIDE VASC ACCESS RIGHT  03/06/2017   IR US  GUIDE VASC ACCESS RIGHT  03/25/2017   IR US  GUIDE VASC ACCESS RIGHT  01/20/2019  IR VENIPUNCTURE 91YRS/OLDER BY MD  01/20/2019   LEFT HEART CATH AND CORONARY ANGIOGRAPHY N/A 01/07/2018   Procedure: LEFT HEART CATH AND CORONARY ANGIOGRAPHY;  Surgeon: Burnard Debby LABOR, MD;  Location: [facility] INVASIVE CV [facility];  Service: Cardiovascular;  Laterality: N/A;   POSTERIOR VITRECTOMY AND MEMBRANE PEEL-LEFT EYE  09/28/2002   POSTERIOR VITRECTOMY AND MEMBRANE PEEL-RIGHT EYE  03/16/2002   RETINAL DETACHMENT SURGERY     TEE WITHOUT CARDIOVERSION N/A 01/14/2018   Procedure: TRANSESOPHAGEAL ECHOCARDIOGRAM (TEE);  Surgeon: Army Dallas NOVAK, MD;  Location: [facility] OR;  Service: Open Heart Surgery;   Laterality: N/A;    Family History  Problem Relation Age of Onset   Cystic fibrosis Mother    Hypertension Father    Diabetes Brother    Hypertension Maternal Grandmother     Social History   Socioeconomic History   Marital status: Single    Spouse name: Not on file   Number of children: 1   Years of education: Not on file   Highest education level: Not on file  Occupational History   Occupation: Unemployed  Tobacco Use   Smoking status: Never   Smokeless tobacco: Never  Vaping Use   Vaping status: Never Used  Substance and Sexual Activity   Alcohol  use: No   Drug use: Not Currently    Types: Marijuana    Comment: none in 4 years   Sexual activity: Yes    Partners: Male    Birth control/protection: None  Other Topics Concern   Not on file  Social History Narrative   Not on file   Social Drivers of Health   Financial Resource Strain: Low Risk  (01/07/2024)   Overall Financial Resource Strain (CARDIA)    Difficulty of Paying Living Expenses: Not hard at all  Food Insecurity: No Food Insecurity (01/07/2024)   Hunger Vital Sign    Worried About Running Out of Food in the Last Year: Never true    Ran Out of Food in the Last Year: Never true  Transportation Needs: No Transportation Needs (01/07/2024)   PRAPARE - Administrator, Civil Service (Medical): No    Lack of Transportation (Non-Medical): No  Physical Activity: Inactive (01/07/2024)   Exercise Vital Sign    Days of Exercise per Week: 0 days    Minutes of Exercise per Session: 0 min  Stress: No Stress Concern Present (01/07/2024)   [employer] of Occupational Health - Occupational Stress Questionnaire    Feeling of Stress: Not at all  Social Connections: Moderately Isolated (01/07/2024)   Social Connection and Isolation Panel    Frequency of Communication with Friends and Family: More than three times a week    Frequency of Social Gatherings with Friends and Family: Three times a week     Attends Religious Services: 1 to 4 times per year    Active Member of Clubs or Organizations: No    Attends Banker Meetings: Never    Marital Status: Never married    Allergies  Allergen Reactions   Anesthetics, Amide Nausea And Vomiting   Chlorhexidine  Other (See Comments)    Unknown reaction   Penicillins Diarrhea, Nausea And Vomiting and Other (See Comments)   Enkaid [Encainide] Nausea And Vomiting   Reglan  [Metoclopramide ] Swelling and Other (See Comments)    Dystonia Muscle rigidity Slurred speech Tongue swelling   Subutex [Buprenorphine] Rash    Outpatient Medications Prior to Visit  Medication Sig Dispense Refill   acetaminophen  (TYLENOL ) 500 MG  tablet Take 1,000 mg by mouth at bedtime as needed for mild pain.     ammonium lactate  (AMLACTIN) 12 % cream Apply 1 Application topically as needed for dry skin. 385 g 2   aspirin  EC 81 MG tablet Take 1 tablet (81 mg total) by mouth daily.     Continuous Glucose Sensor (DEXCOM G7 SENSOR) MISC Use to check glucose continuously, change sensor every 10 days 9 each 3   famotidine  (PEPCID ) 40 MG tablet      furosemide  (LASIX ) 40 MG tablet Take 1 tablet (40 mg total) by mouth daily. 90 tablet 3   insulin  aspart (NOVOLOG ) 100 UNIT/ML injection Use up to 50 units/day via insulin  pump. 30 mL 5   Insulin  Disposable Pump (OMNIPOD 5 [id number] PODS GEN 5) MISC SMARTSIG:1 Each SUB-Q Every 3 Days     insulin  glargine (LANTUS ) 100 UNIT/ML injection Lantus  9 units bid 10 mL 1   losartan  (COZAAR ) 25 MG tablet Take 1 tablet (25 mg total) by mouth daily. Hold this medication if your systolic blood pressure ( top number) s less than 100. Please call your doctor is your systolic blood pressure is less than 100 90 tablet 3   metoprolol  tartrate (LOPRESSOR ) 25 MG tablet Take 0.5 tablets (12.5 mg total) by mouth 2 (two) times daily. 45 tablet 3   ondansetron  (ZOFRAN ) 4 MG tablet Take 1 tablet (4 mg total) by mouth every 8 (eight) hours as needed  for nausea or vomiting. 90 tablet 1   oxyCODONE -acetaminophen  (PERCOCET) 10-325 MG tablet Take 1 tablet by mouth 3 (three) times daily as needed.     polyethylene glycol (MIRALAX  / GLYCOLAX ) 17 g packet Take 17 g by mouth 2 (two) times daily. (Patient taking differently: Take 17 g by mouth daily as needed.) 14 each 0   pregabalin  (LYRICA ) 150 MG capsule Take 1 capsule (150 mg total) by mouth in the morning, at noon, and at bedtime. 270 capsule 3   rosuvastatin  (CRESTOR ) 20 MG tablet Take 1 tablet (20 mg total) by mouth daily. 90 tablet 3   No facility-administered medications prior to visit.     ROS Review of Systems  Constitutional:  Negative for activity change and appetite change.  HENT:  Negative for sinus pressure and sore throat.   Eyes:  Positive for discharge.  Respiratory:  Negative for chest tightness, shortness of breath and wheezing.   Cardiovascular:  Negative for chest pain and palpitations.  Gastrointestinal:  Negative for abdominal distention, abdominal pain and constipation.  Genitourinary: Negative.   Musculoskeletal: Negative.   Psychiatric/Behavioral:  Negative for behavioral problems and dysphoric mood.     Objective:  BP 137/74 (BP Location: Left Arm, Patient Position: Sitting, Cuff Size: Large)   Pulse (!) 58   Wt 185 lb (83.9 kg)   SpO2 98%   BMI 34.96 kg/m      01/08/2024    2:14 PM 01/07/2024    2:52 PM 10/24/2023    1:47 PM  BP/Weight  Systolic BP 137  889  Diastolic BP 74  50  Wt. (Lbs) 185 184 185.4  BMI 34.96 kg/m2 34.77 kg/m2 35.03 kg/m2      Physical Exam Constitutional:      Appearance: She is well-developed.  HENT:     Right Ear: There is impacted cerumen.     Left Ear: There is impacted cerumen (minimal).  Eyes:     General:        Right eye: No discharge.  Left eye: No discharge.     Conjunctiva/sclera: Conjunctivae normal.  Cardiovascular:     Rate and Rhythm: Normal rate.     Heart sounds: Normal heart sounds. No murmur  heard. Pulmonary:     Effort: Pulmonary effort is normal.     Breath sounds: Normal breath sounds. No wheezing or rales.  Chest:     Chest wall: No tenderness.  Abdominal:     General: Bowel sounds are normal. There is no distension.     Palpations: Abdomen is soft. There is no mass.     Tenderness: There is no abdominal tenderness.  Musculoskeletal:        General: Normal range of motion.     Right lower leg: No edema.     Left lower leg: No edema.  Neurological:     Mental Status: She is alert and oriented to person, place, and time.  Psychiatric:        Mood and Affect: Mood normal.        Latest Ref Rng & Units 06/23/2023    6:28 AM 06/22/2023    5:01 PM 06/22/2023    8:56 AM  CMP  Glucose 70 - 99 mg/dL 758  736  870   BUN 6 - 20 mg/dL 24  19  19    Creatinine 0.44 - 1.00 mg/dL 8.46  8.52  8.58   Sodium 135 - 145 mmol/L 138  137  140   Potassium 3.5 - 5.1 mmol/L 4.4  4.1  3.8   Chloride 98 - 111 mmol/L 107  107  109   CO2 22 - 32 mmol/L 22  20  23    Calcium  8.9 - 10.3 mg/dL 9.0  9.1  9.0     Lipid Panel     Component Value Date/Time   CHOL 183 08/09/2021 1151   TRIG 104.0 08/09/2021 1151   HDL 57.50 08/09/2021 1151   CHOLHDL 3 08/09/2021 1151   VLDL 20.8 08/09/2021 1151   LDLCALC 105 (H) 08/09/2021 1151   LDLDIRECT 96.0 07/04/2022 1129    CBC    Component Value Date/Time   WBC 5.9 06/21/2023 1153   RBC 5.15 (H) 06/21/2023 1153   HGB 14.1 06/21/2023 1153   HGB 11.4 11/05/2018 1419   HCT 42.8 06/21/2023 1153   HCT 33.5 (L) 11/05/2018 1419   PLT 246 06/21/2023 1153   PLT 292 11/05/2018 1419   MCV 83.1 06/21/2023 1153   MCV 89 11/05/2018 1419   MCH 27.4 06/21/2023 1153   MCHC 32.9 06/21/2023 1153   RDW 13.2 06/21/2023 1153   RDW 12.7 11/05/2018 1419   LYMPHSABS 1.5 01/08/2022 1218   MONOABS 0.9 01/08/2022 1218   EOSABS 0.1 01/08/2022 1218   BASOSABS 0.1 01/08/2022 1218    Lab Results  Component Value Date   HGBA1C 8.3 (A) 08/28/2023        Assessment & Plan Ear Wax Impaction Right ear wax impaction due to Q-tip use. No pain or foreign body sensation. Left ear with minimal wax. - Perform ear irrigation for right ear. - Left ear irrigation as well due to minimal cerumen  Eye Crusting Eye crusting likely due to allergies. No conjunctivitis signs or visual problems. - Recommend cleaning eyes with warm cloth and water. - Monitor symptoms; follow up with ophthalmologist if needed.  General Health Maintenance Due for screenings and vaccinations. Concern about mammogram discomfort due to breast size, but importance acknowledged due to family history of breast cancer. - Administer pneumonia vaccine  next visit. - Offer Pap smear if unable to schedule with gynecologist.  Follow-up Scheduled appointments for chronic conditions and health maintenance. - Schedule wellness exam and physical on August 6th. - Ensure endocrine follow-up on August 20th.    No orders of the defined types were placed in this encounter.   Follow-up: Return for previously scheduled appointment; change appointment to Medicare wellness and Pap smear..       Amiya Escamilla, MD, FAAFP. [facility] [place], [place] [phone]   01/08/2024, 5:34 PM

## 2024-01-09 NOTE — Addendum Note (Signed)
 Addended by: DELORES RUBY F on: 01/09/2024 04:00 PM   Modules accepted: Orders

## 2024-01-29 ENCOUNTER — Other Ambulatory Visit (HOSPITAL_COMMUNITY)
Admission: RE | Admit: 2024-01-29 | Discharge: 2024-01-29 | Disposition: A | Discharge status: Home / Self Care | Source: Ambulatory Visit | Attending: Family Medicine | Admitting: Family Medicine

## 2024-01-29 ENCOUNTER — Ambulatory Visit: Attending: Family Medicine | Admitting: Family Medicine

## 2024-01-29 ENCOUNTER — Encounter: Payer: Self-pay | Admitting: Family Medicine

## 2024-01-29 VITALS — BP 116/69 | HR 59 | Ht 61.0 in | Wt 184.0 lb

## 2024-01-29 DIAGNOSIS — Z0000 Encounter for general adult medical examination without abnormal findings: Secondary | ICD-10-CM

## 2024-01-29 DIAGNOSIS — Z1211 Encounter for screening for malignant neoplasm of colon: Secondary | ICD-10-CM

## 2024-01-29 DIAGNOSIS — Z113 Encounter for screening for infections with a predominantly sexual mode of transmission: Secondary | ICD-10-CM

## 2024-01-29 DIAGNOSIS — Z01419 Encounter for gynecological examination (general) (routine) without abnormal findings: Secondary | ICD-10-CM

## 2024-01-29 DIAGNOSIS — Z124 Encounter for screening for malignant neoplasm of cervix: Secondary | ICD-10-CM

## 2024-01-29 DIAGNOSIS — Z1231 Encounter for screening mammogram for malignant neoplasm of breast: Secondary | ICD-10-CM

## 2024-01-29 DIAGNOSIS — Z1151 Encounter for screening for human papillomavirus (HPV): Secondary | ICD-10-CM | POA: Diagnosis not present

## 2024-01-29 DIAGNOSIS — Z1159 Encounter for screening for other viral diseases: Secondary | ICD-10-CM

## 2024-01-29 NOTE — Progress Notes (Signed)
 Subjective:  Patient ID: [name], female    DOB: [birth year]  Age: 58 y.o. MRN: [id number]  CC: Gynecologic Exam     Discussed the use of AI scribe software for clinical note transcription with the patient, who gave verbal consent to proceed.  History of Present Illness [name] is a 58 year old female with a history of Type 1 diabetes mellitus (A1c 10.9 managed by endocrine), diabetic neuropathy, diabetic gastroparesis, hypertension, anemia, anxiety and depression, multiple ED visits and hospitalizations for diabetic gastroparesis, CAD s/p CABG x3 (LIMA to LAD, SVG to PDA, SVG to OM) in 12/2017, R femoral fracture (s/p intramedullary nail in 04/2019) who presents for a routine gynecological exam.  She is requesting STD testing following the end of a long-term relationship.  She recently ended a five and a half year relationship after discovering her partner was untruthful. She is concerned about potential exposure to sexually transmitted diseases, including HIV and syphilis, and seeks testing. She also requests a Pap smear. Also due for screening for colon cancer and breast cancer.   She developed a foot infection after a recent beach trip. As a diabetic, she consulted a podiatrist who prescribed antibiotics and a topical cream. She uses a walker and has paused her treadmill exercise routine due to the foot injury. A follow-up is scheduled in two weeks.  Two weeks ago, she had an ear issue that required an urgent referral, but she has not followed up due to her foot infection and travel.     Past Medical History:  Diagnosis Date   AKI (acute kidney injury) (HCC)    Anemia, iron  deficiency    Anxiety and depression 05/18/2015   CAD (coronary artery disease), native coronary artery 01/11/2018   s/p CABG - Non-STEMI with severe three-vessel disease noted July 2019   Depression    Diabetic gastroparesis (HCC)    Diabetic neuropathy, type I diabetes mellitus (HCC) 05/18/2015    DKA, type 1 (HCC) 03/18/2016   Essential hypertension    Gastroparesis    GERD (gastroesophageal reflux disease)    HLD (hyperlipidemia)    MDD (major depressive disorder), single episode, severe , no psychosis (HCC)    Tardive dyskinesia    Vitamin B12 deficiency 08/16/2015    Past Surgical History:  Procedure Laterality Date   CARDIAC SURGERY     COLONOSCOPY  09/27/2014   at [facility]   CORONARY ARTERY BYPASS GRAFT N/A 01/14/2018   Procedure: CORONARY ARTERY BYPASS GRAFTING (CABG)X3, RIGHT AND LEFT SAPHENOUS VEIN HARVEST, MAMMARY TAKE DOWN. MAMMARY TO LAD, SVG TO PD, SVG TO DISTAL CIRC.;  Surgeon: Army Dallas NOVAK, MD;  Location: [facility] OR;  Service: Open Heart Surgery;  Laterality: N/A;   ESOPHAGOGASTRODUODENOSCOPY  09/27/2014   at [facility], Dr Gretel Crigler. biospy neg for celiac, neg for H pylori.    EYE SURGERY     gailstones     INTRAMEDULLARY (IM) NAIL INTERTROCHANTERIC Right 05/10/2019   Procedure: INTRAMEDULLARY (IM) NAIL INTERTROCHANTRIC;  Surgeon: Leora Lynwood SAUNDERS, MD;  Location: [facility] ORS;  Service: Orthopedics;  Laterality: Right;   IR FLUORO GUIDE CV LINE RIGHT  02/01/2017   IR FLUORO GUIDE CV LINE RIGHT  03/06/2017   IR FLUORO GUIDE CV LINE RIGHT  03/25/2017   IR GENERIC HISTORICAL  01/24/2016   IR FLUORO GUIDE CV LINE RIGHT 01/24/2016 Darrell K Allred, PA-C [facility]   IR GENERIC HISTORICAL  01/24/2016   IR US  GUIDE VASC ACCESS RIGHT 01/24/2016 Darrell MARLA Rakers, PA-C [facility]   IR US  GUIDE VASC ACCESS RIGHT  02/01/2017   IR US  GUIDE VASC ACCESS RIGHT  03/06/2017   IR US  GUIDE VASC ACCESS RIGHT  03/25/2017   IR US  GUIDE VASC ACCESS RIGHT  01/20/2019   IR VENIPUNCTURE 43YRS/OLDER BY MD  01/20/2019   LEFT HEART CATH AND CORONARY ANGIOGRAPHY N/A 01/07/2018   Procedure: LEFT HEART CATH AND CORONARY ANGIOGRAPHY;  Surgeon: Burnard Debby LABOR, MD;  Location: [facility] INVAS[facility] CV [facility];  Service: Cardiovascular;  Laterality: N/A;   POSTERIOR VITRECTOMY AND MEMBRANE PEEL-LEFT EYE  09/28/2002   POSTERIOR  VITRECTOMY AND MEMBRANE PEEL-RIGHT EYE  03/16/2002   RETINAL DETACHMENT SURGERY     TEE WITHOUT CARDIOVERSION N/A 01/14/2018   Procedure: TRANSESOPHAGEAL ECHOCARDIOGRAM (TEE);  Surgeon: Army Dallas NOVAK, MD;  Location: [facility];  Service: Open Heart Surgery;  Laterality: N/A;    Family History  Problem Relation Age of Onset   Cystic fibrosis Mother    Hypertension Father    Diabetes Brother    Hypertension Maternal Grandmother     Social History   Socioeconomic History   Marital status: Single    Spouse name: Not on file   Number of children: 1   Years of education: Not on file   Highest education level: Not on file  Occupational History   Occupation: Unemployed  Tobacco Use   Smoking status: Never   Smokeless tobacco: Never  Vaping Use   Vaping status: Never Used  Substance and Sexual Activity   Alcohol  use: No   Drug use: Not Currently    Types: Marijuana    Comment: none in 4 years   Sexual activity: Yes    Partners: Male    Birth control/protection: None  Other Topics Concern   Not on file  Social History Narrative   Not on file   Social Drivers of Health   Financial Resource Strain: Low Risk  (01/07/2024)   Overall Financial Resource Strain (CARDIA)    Difficulty of Paying Living Expenses: Not hard at all  Food Insecurity: No Food Insecurity (01/07/2024)   Hunger Vital Sign    Worried About Running Out of Food in the Last Year: Never true    Ran Out of Food in the Last Year: Never true  Transportation Needs: No Transportation Needs (01/07/2024)   PRAPARE - Administrator, Civil Service (Medical): No    Lack of Transportation (Non-Medical): No  Physical Activity: Inactive (01/07/2024)   Exercise Vital Sign    Days of Exercise per Week: 0 days    Minutes of Exercise per Session: 0 min  Stress: No Stress Concern Present (01/07/2024)   [employer] of Occupational Health - Occupational Stress Questionnaire    Feeling of Stress: Not at all  Social  Connections: Moderately Isolated (01/07/2024)   Social Connection and Isolation Panel    Frequency of Communication with Friends and Family: More than three times a week    Frequency of Social Gatherings with Friends and Family: Three times a week    Attends Religious Services: 1 to 4 times per year    Active Member of Clubs or Organizations: No    Attends Banker Meetings: Never    Marital Status: Never married    Allergies  Allergen Reactions   Anesthetics, Amide Nausea And Vomiting   Chlorhexidine  Other (See Comments)    Unknown reaction   Penicillins Diarrhea, Nausea And Vomiting and Other (See Comments)   Enkaid [Encainide] Nausea And Vomiting  Reglan  [Metoclopramide ] Swelling and Other (See Comments)    Dystonia Muscle rigidity Slurred speech Tongue swelling   Subutex [Buprenorphine] Rash    Outpatient Medications Prior to Visit  Medication Sig Dispense Refill   acetaminophen  (TYLENOL ) 500 MG tablet Take 1,000 mg by mouth at bedtime as needed for mild pain.     ammonium lactate  (AMLACTIN) 12 % cream Apply 1 Application topically as needed for dry skin. 385 g 2   aspirin  EC 81 MG tablet Take 1 tablet (81 mg total) by mouth daily.     Continuous Glucose Sensor (DEXCOM G7 SENSOR) MISC Use to check glucose continuously, change sensor every 10 days 9 each 3   famotidine  (PEPCID ) 40 MG tablet      furosemide  (LASIX ) 40 MG tablet Take 1 tablet (40 mg total) by mouth daily. 90 tablet 3   insulin  aspart (NOVOLOG ) 100 UNIT/ML injection Use up to 50 units/day via insulin  pump. 30 mL 5   Insulin  Disposable Pump (OMNIPOD 5 DEXG7G6 PODS GEN 5) MISC SMARTSIG:1 Each SUB-Q Every 3 Days     insulin  glargine (LANTUS ) 100 UNIT/ML injection Lantus  9 units bid 10 mL 1   losartan  (COZAAR ) 25 MG tablet Take 1 tablet (25 mg total) by mouth daily. Hold this medication if your systolic blood pressure ( top number) s less than 100. Please call your doctor is your systolic blood pressure is  less than 100 90 tablet 3   metoprolol  tartrate (LOPRESSOR ) 25 MG tablet Take 0.5 tablets (12.5 mg total) by mouth 2 (two) times daily. 45 tablet 3   ondansetron  (ZOFRAN ) 4 MG tablet Take 1 tablet (4 mg total) by mouth every 8 (eight) hours as needed for nausea or vomiting. 90 tablet 1   oxyCODONE -acetaminophen  (PERCOCET) 10-325 MG tablet Take 1 tablet by mouth 3 (three) times daily as needed.     polyethylene glycol (MIRALAX  / GLYCOLAX ) 17 g packet Take 17 g by mouth 2 (two) times daily. (Patient taking differently: Take 17 g by mouth daily as needed.) 14 each 0   pregabalin  (LYRICA ) 150 MG capsule Take 1 capsule (150 mg total) by mouth in the morning, at noon, and at bedtime. 270 capsule 3   rosuvastatin  (CRESTOR ) 20 MG tablet Take 1 tablet (20 mg total) by mouth daily. 90 tablet 3   No facility-administered medications prior to visit.     ROS Review of Systems  Constitutional:  Negative for activity change and appetite change.  HENT:  Negative for sinus pressure and sore throat.   Respiratory:  Negative for chest tightness, shortness of breath and wheezing.   Cardiovascular:  Negative for chest pain and palpitations.  Gastrointestinal:  Negative for abdominal distention, abdominal pain and constipation.  Genitourinary: Negative.   Musculoskeletal: Negative.   Psychiatric/Behavioral:  Negative for behavioral problems and dysphoric mood.     Objective:  BP 116/69   Pulse (!) 59   Ht 5' 1 (1.549 m)   Wt 184 lb (83.5 kg)   SpO2 100%   BMI 34.77 kg/m      01/29/2024    3:26 PM 01/08/2024    2:14 PM 01/07/2024    2:52 PM  BP/Weight  Systolic BP 116 137   Diastolic BP 69 74   Wt. (Lbs) 184 185 184  BMI 34.77 kg/m2 34.96 kg/m2 34.77 kg/m2      Physical Exam Exam conducted with a chaperone present.  Constitutional:      General: She is not in acute distress.  Appearance: She is well-developed. She is not diaphoretic.  HENT:     Head: Normocephalic.     Right Ear:  External ear normal.     Left Ear: External ear normal.     Nose: Nose normal.  Eyes:     Conjunctiva/sclera: Conjunctivae normal.     Pupils: Pupils are equal, round, and reactive to light.  Neck:     Vascular: No JVD.  Cardiovascular:     Rate and Rhythm: Normal rate and regular rhythm.     Heart sounds: Normal heart sounds. No murmur heard.    No gallop.  Pulmonary:     Effort: Pulmonary effort is normal. No respiratory distress.     Breath sounds: Normal breath sounds. No wheezing or rales.  Chest:     Chest wall: No tenderness.  Breasts:    Right: Normal. No mass, nipple discharge or tenderness.     Left: Normal. No mass, nipple discharge or tenderness.  Abdominal:     General: Bowel sounds are normal. There is no distension.     Palpations: Abdomen is soft. There is no mass.     Tenderness: There is no abdominal tenderness.     Hernia: There is no hernia in the left inguinal area or right inguinal area.  Genitourinary:    General: Normal vulva.     Pubic Area: No rash.      Labia:        Right: No rash.        Left: No rash.      Vagina: Normal.     Cervix: Normal.     Uterus: Normal.      Adnexa: Right adnexa normal and left adnexa normal.       Right: No tenderness.         Left: No tenderness.    Musculoskeletal:        General: No tenderness. Normal range of motion.     Cervical back: Normal range of motion. No tenderness.  Lymphadenopathy:     Upper Body:     Right upper body: No supraclavicular or axillary adenopathy.     Left upper body: No supraclavicular or axillary adenopathy.  Skin:    General: Skin is warm and dry.  Neurological:     Mental Status: She is alert and oriented to person, place, and time.     Deep Tendon Reflexes: Reflexes are normal and symmetric.        Latest Ref Rng & Units 06/23/2023    6:28 AM 06/22/2023    5:01 PM 06/22/2023    8:56 AM  CMP  Glucose 70 - 99 mg/dL 758  736  870   BUN 6 - 20 mg/dL 24  19  19    Creatinine  0.44 - 1.00 mg/dL 8.46  8.52  8.58   Sodium 135 - 145 mmol/L 138  137  140   Potassium 3.5 - 5.1 mmol/L 4.4  4.1  3.8   Chloride 98 - 111 mmol/L 107  107  109   CO2 22 - 32 mmol/L 22  20  23    Calcium  8.9 - 10.3 mg/dL 9.0  9.1  9.0     Lipid Panel     Component Value Date/Time   CHOL 183 08/09/2021 1151   TRIG 104.0 08/09/2021 1151   HDL 57.50 08/09/2021 1151   CHOLHDL 3 08/09/2021 1151   VLDL 20.8 08/09/2021 1151   LDLCALC 105 (H) 08/09/2021 1151   LDLDIRECT 96.0  07/04/2022 1129    CBC    Component Value Date/Time   WBC 5.9 06/21/2023 1153   RBC 5.15 (H) 06/21/2023 1153   HGB 14.1 06/21/2023 1153   HGB 11.4 11/05/2018 1419   HCT 42.8 06/21/2023 1153   HCT 33.5 (L) 11/05/2018 1419   PLT 246 06/21/2023 1153   PLT 292 11/05/2018 1419   MCV 83.1 06/21/2023 1153   MCV 89 11/05/2018 1419   MCH 27.4 06/21/2023 1153   MCHC 32.9 06/21/2023 1153   RDW 13.2 06/21/2023 1153   RDW 12.7 11/05/2018 1419   LYMPHSABS 1.5 01/08/2022 1218   MONOABS 0.9 01/08/2022 1218   EOSABS 0.1 01/08/2022 1218   BASOSABS 0.1 01/08/2022 1218    Lab Results  Component Value Date   HGBA1C 8.3 (A) 08/28/2023      1. Annual physical exam (Primary) Counseled on 150 minutes of exercise per week, healthy eating (including decreased daily intake of saturated fats, cholesterol, added sugars, sodium), STI prevention, routine healthcare maintenance.   2. Need for hepatitis C screening test - HCV Ab w Reflex to Quant PCR   3. Screening for cervical cancer  Cytology - PAP  4. Screening for STD (sexually transmitted disease) - Cervicovaginal ancillary only - HIV Antibody (routine testing w rflx) - RPR w/reflex to TrepSure  5. Screening for colon cancer - Ambulatory referral to Gastroenterology  6. Encounter for screening mammogram for malignant neoplasm of breast - MM 3D SCREENING MAMMOGRAM BILATERAL BREAST; Future     No orders of the defined types were placed in this  encounter.   Follow-up: Return in about 6 months (around 07/31/2024) for Chronic medical conditions.       Corrina Sabin, MD, FAAFP. [facility] [place], [place] [phone]   01/29/2024, 4:04 PM

## 2024-01-29 NOTE — Patient Instructions (Addendum)
 Placed in [facility] ENT SPECIALIST [address], [place], [place] [address] Ph# [phone]    Sent Referral to [facility]   [address] [place], [place] [address] PH# [phone]  Fax  [phone]

## 2024-01-31 LAB — CERVICOVAGINAL ANCILLARY ONLY
Bacterial Vaginitis (gardnerella): NEGATIVE
Candida Glabrata: NEGATIVE
Candida Vaginitis: NEGATIVE
Chlamydia: NEGATIVE
Comment: NEGATIVE
Comment: NEGATIVE
Comment: NEGATIVE
Comment: NEGATIVE
Comment: NEGATIVE
Comment: NORMAL
Neisseria Gonorrhea: NEGATIVE
Trichomonas: NEGATIVE

## 2024-02-03 ENCOUNTER — Ambulatory Visit: Payer: Self-pay | Admitting: Family Medicine

## 2024-02-03 DIAGNOSIS — A539 Syphilis, unspecified: Secondary | ICD-10-CM

## 2024-02-04 LAB — CYTOLOGY - PAP
Adequacy: ABSENT
Comment: NEGATIVE
Comment: NEGATIVE
Comment: NEGATIVE
Diagnosis: REACTIVE
HPV 16: POSITIVE — AB
HPV 18 / 45: NEGATIVE
High risk HPV: POSITIVE — AB

## 2024-02-12 ENCOUNTER — Ambulatory Visit: Attending: Family Medicine

## 2024-02-12 ENCOUNTER — Encounter: Payer: Self-pay | Admitting: Endocrinology

## 2024-02-12 ENCOUNTER — Ambulatory Visit: Payer: Self-pay | Admitting: Endocrinology

## 2024-02-12 ENCOUNTER — Ambulatory Visit (INDEPENDENT_AMBULATORY_CARE_PROVIDER_SITE_OTHER): Admitting: Endocrinology

## 2024-02-12 VITALS — BP 134/70 | HR 63 | Resp 20 | Ht 61.0 in | Wt 183.0 lb

## 2024-02-12 DIAGNOSIS — E1065 Type 1 diabetes mellitus with hyperglycemia: Secondary | ICD-10-CM

## 2024-02-12 LAB — POCT GLYCOSYLATED HEMOGLOBIN (HGB A1C): Hemoglobin A1C: 10.5 % — AB (ref 4.0–5.6)

## 2024-02-12 MED ORDER — INSULIN ASPART 100 UNIT/ML IJ SOLN: INTRAMUSCULAR | 5 refills | Status: DC

## 2024-02-12 MED ORDER — INSULIN SYRINGE 31G X 5/16" 0.5 ML MISC: 3 refills | Status: DC

## 2024-02-12 MED ORDER — OMNIPOD 5 DEXG7G6 PODS GEN 5 MISC: 1.0000 | 3 refills | Status: AC

## 2024-02-12 MED ORDER — CONTOUR NEXT TEST VI STRP: ORAL_STRIP | 12 refills | Status: DC

## 2024-02-12 MED ORDER — DEXCOM G7 SENSOR MISC: 3 refills | Status: DC

## 2024-02-12 NOTE — Patient Instructions (Addendum)
 Diabetes regimen:  Lantus  9 units two times a day. Novolog  10-12 units with meals up to 3 times a day.  Have visit with diabetes educator / Rock to start OmniPod 5 with [facility] G7.

## 2024-02-12 NOTE — Progress Notes (Signed)
 Outpatient Endocrinology Note Yvette Mavryk Pino, MD  02/12/24  Patient's Name: [name]    DOB: [birth year]    MRN: [id number]                                                    REASON OF VISIT: Follow up for type 1 diabetes mellitus  PCP: Delbert Clam, MD  HISTORY OF PRESENT ILLNESS:   [name] is a 58 y.o. old female with past medical history listed below, is here for follow up for type 1 diabetes mellitus.    Pertinent Diabetes History: Patient was previously seen by Dr. Von and Dr. Dartha.  Patient was diagnosed with type 1 diabetes mellitus in 1977.  Patient had periodic episodes of diabetes ketoacidosis in the past.  Patient used to be on OmniPod Dash in the past was started in January 2021 was discontinued due to cost issue, around early 2023.  OmniPod 5 with Dexcom G6 was started in end of January 2025.  Chronic Diabetes Complications : Retinopathy: yes. Last ophthalmology exam was done on ?, following with ophthalmology regularly ?SABRA  Retinal detachment. Nephropathy: Microalbuminuria, CKD 3A on ACE/ARB /losartan  Peripheral neuropathy: yes, on pregabalin , she has been seeing podiatry as well. Coronary artery disease: yes s/p CABG in 2019.  Stroke: yes She has gastroparesis treated with Zofran  as needed.  Also on Reglan  with good relief of symptoms.  Relevant comorbidities and cardiovascular risk factors: Obesity: yes Body mass index is 34.58 kg/m.  Hypertension: Yes  Hyperlipidemia : Yes, on statin   Current / Home Diabetic regimen includes:  She is taking Lantus  9 units 2 times a day and NovoLog  10 to 12 units with meals 1-3 times a day.  OmniPod 5 with Dexcom G6 using NovoLog  U100 : Currently not using.  Pump setting  Basal rates ( 20.6 units/day) maximum basal rate 2.5 units/h 12 AM : 0.75 units/hr 10 AM: 1.0 10:30 AM: 1.2 4 PM:      0.75  Carb ratio 12 AM: 1:1  Sensitivity 12 AM : 1:35  Target 120  Active insulin  time 4 hours  She  does not do actual carb counting she has been using carb number based on meal size.  Prior diabetic medications: OmniPod Dash. OmniPod 4 insulin  pump since 07/07/2019, currently not using since early 2023. Basal bolus insulin  regimen.   Glycemic data /pump data:   No glucose data available to review.  Hypoglycemia: Patient has no hypoglycemic episodes. Patient has hypoglycemia awareness.  Factors modifying glucose control: 1.  Diabetic diet assessment: 3 meals a day.  2.  Staying active or exercising: No formal exercise.  3.  Medication compliance: compliant most of the time.  Interval history  Patient currently is not using insulin  pump and CGM.  No glucose data available to review.  Patient reports she is having relationship issue for the last 2 to 3 months, not able to afford for insulin  pump supply.  She has not actually been using injectable insulin , taking Lantus  9 units 2 times a day and NovoLog  10 to 12 units 1-3 times a day with meals.  She ran out of the Lantus  3 days ago.  She denies abdominal pain, nausea or vomiting.  She reports she has Dexcom G7 and also OmniPod 5 pump supplies, her family had to get the supplies.  She  may have supply for coming 3 months.  She wants to go back to the insulin  pump however she does not feel comfortable using the Dexcom G7.  She had used Dexcom G6 in the past with pump.  She does not feel comfortable downloading the new app for Dexcom G7.  She wants to have diabetic educator/pump trainer to restart her insulin  pump.  No other complaints today.  Hemoglobin A1c worsened to 10.5% today.  REVIEW OF SYSTEMS As per history of present illness.   PAST MEDICAL HISTORY: Past Medical History:  Diagnosis Date   AKI (acute kidney injury) (HCC)    Anemia, iron  deficiency    Anxiety and depression 05/18/2015   CAD (coronary artery disease), native coronary artery 01/11/2018   s/p CABG - Non-STEMI with severe three-vessel disease noted July 2019    Depression    Diabetic gastroparesis (HCC)    Diabetic neuropathy, type I diabetes mellitus (HCC) 05/18/2015   DKA, type 1 (HCC) 03/18/2016   Essential hypertension    Gastroparesis    GERD (gastroesophageal reflux disease)    HLD (hyperlipidemia)    MDD (major depressive disorder), single episode, severe , no psychosis (HCC)    Tardive dyskinesia    Vitamin B12 deficiency 08/16/2015    PAST SURGICAL HISTORY: Past Surgical History:  Procedure Laterality Date   CARDIAC SURGERY     COLONOSCOPY  09/27/2014   at [facility]   CORONARY ARTERY BYPASS GRAFT N/A 01/14/2018   Procedure: CORONARY ARTERY BYPASS GRAFTING (CABG)X3, RIGHT AND LEFT SAPHENOUS VEIN HARVEST, MAMMARY TAKE DOWN. MAMMARY TO LAD, SVG TO PD, SVG TO DISTAL CIRC.;  Surgeon: Army Dallas NOVAK, MD;  Location: [facility] OR;  Service: Open Heart Surgery;  Laterality: N/A;   ESOPHAGOGASTRODUODENOSCOPY  09/27/2014   at [facility], Dr Gretel Crigler. biospy neg for celiac, neg for H pylori.    EYE SURGERY     gailstones     INTRAMEDULLARY (IM) NAIL INTERTROCHANTERIC Right 05/10/2019   Procedure: INTRAMEDULLARY (IM) NAIL INTERTROCHANTRIC;  Surgeon: Leora Lynwood SAUNDERS, MD;  Location: [facility] ORS;  Service: Orthopedics;  Laterality: Right;   IR FLUORO GUIDE CV LINE RIGHT  02/01/2017   IR FLUORO GUIDE CV LINE RIGHT  03/06/2017   IR FLUORO GUIDE CV LINE RIGHT  03/25/2017   IR GENERIC HISTORICAL  01/24/2016   IR FLUORO GUIDE CV LINE RIGHT 01/24/2016 Darrell K Allred, PA-C [facility]   IR GENERIC HISTORICAL  01/24/2016   IR US  GUIDE VASC ACCESS RIGHT 01/24/2016 Darrell K Allred, PA-C [facility]   IR US  GUIDE VASC ACCESS RIGHT  02/01/2017   IR US  GUIDE VASC ACCESS RIGHT  03/06/2017   IR US  GUIDE VASC ACCESS RIGHT  03/25/2017   IR US  GUIDE VASC ACCESS RIGHT  01/20/2019   IR VENIPUNCTURE 62YRS/OLDER BY MD  01/20/2019   LEFT HEART CATH AND CORONARY ANGIOGRAPHY N/A 01/07/2018   Procedure: LEFT HEART CATH AND CORONARY ANGIOGRAPHY;  Surgeon: Burnard Debby LABOR, MD;  Location:  [facility] INVASIVE CV LAB;  Service: Cardiovascular;  Laterality: N/A;   POSTERIOR VITRECTOMY AND MEMBRANE PEEL-LEFT EYE  09/28/2002   POSTERIOR VITRECTOMY AND MEMBRANE PEEL-RIGHT EYE  03/16/2002   RETINAL DETACHMENT SURGERY     TEE WITHOUT CARDIOVERSION N/A 01/14/2018   Procedure: TRANSESOPHAGEAL ECHOCARDIOGRAM (TEE);  Surgeon: Army Dallas NOVAK, MD;  Location: [facility] OR;  Service: Open Heart Surgery;  Laterality: N/A;    ALLERGIES: Allergies  Allergen Reactions   Anesthetics, Amide Nausea And Vomiting   Chlorhexidine  Other (See Comments)  Unknown reaction   Penicillins Diarrhea, Nausea And Vomiting and Other (See Comments)   Enkaid [Encainide] Nausea And Vomiting   Reglan  [Metoclopramide ] Swelling and Other (See Comments)    Dystonia Muscle rigidity Slurred speech Tongue swelling   Subutex [Buprenorphine] Rash    FAMILY HISTORY:  Family History  Problem Relation Age of Onset   Cystic fibrosis Mother    Hypertension Father    Diabetes Brother    Hypertension Maternal Grandmother     SOCIAL HISTORY: Social History   Socioeconomic History   Marital status: Single    Spouse name: Not on file   Number of children: 1   Years of education: Not on file   Highest education level: Not on file  Occupational History   Occupation: Unemployed  Tobacco Use   Smoking status: Never   Smokeless tobacco: Never  Vaping Use   Vaping status: Never Used  Substance and Sexual Activity   Alcohol  use: No   Drug use: Not Currently    Types: Marijuana    Comment: none in 4 years   Sexual activity: Yes    Partners: Male    Birth control/protection: None  Other Topics Concern   Not on file  Social History Narrative   Not on file   Social Drivers of Health   Financial Resource Strain: Low Risk  (01/07/2024)   Overall Financial Resource Strain (CARDIA)    Difficulty of Paying Living Expenses: Not hard at all  Food Insecurity: No Food Insecurity (01/07/2024)   Hunger Vital Sign    Worried  About Running Out of Food in the Last Year: Never true    Ran Out of Food in the Last Year: Never true  Transportation Needs: No Transportation Needs (01/07/2024)   PRAPARE - Administrator, Civil Service (Medical): No    Lack of Transportation (Non-Medical): No  Physical Activity: Inactive (01/07/2024)   Exercise Vital Sign    Days of Exercise per Week: 0 days    Minutes of Exercise per Session: 0 min  Stress: No Stress Concern Present (01/07/2024)   [employer] of Occupational Health - Occupational Stress Questionnaire    Feeling of Stress: Not at all  Social Connections: Moderately Isolated (01/07/2024)   Social Connection and Isolation Panel    Frequency of Communication with Friends and Family: More than three times a week    Frequency of Social Gatherings with Friends and Family: Three times a week    Attends Religious Services: 1 to 4 times per year    Active Member of Clubs or Organizations: No    Attends Banker Meetings: Never    Marital Status: Never married    MEDICATIONS:  Current Outpatient Medications  Medication Sig Dispense Refill   acetaminophen  (TYLENOL ) 500 MG tablet Take 1,000 mg by mouth at bedtime as needed for mild pain.     ammonium lactate  (AMLACTIN) 12 % cream Apply 1 Application topically as needed for dry skin. 385 g 2   aspirin  EC 81 MG tablet Take 1 tablet (81 mg total) by mouth daily.     famotidine  (PEPCID ) 40 MG tablet      furosemide  (LASIX ) 40 MG tablet Take 1 tablet (40 mg total) by mouth daily. 90 tablet 3   glucose blood (CONTOUR NEXT TEST) test strip Use to test blood glucose before meals and at bedtime 100 each 12   Insulin  Disposable Pump (OMNIPOD 5 DEXG7G6 PODS GEN 5) MISC 1 each by Does not  apply route every 3 (three) days. 15 each 3   insulin  glargine (LANTUS ) 100 UNIT/ML injection Lantus  9 units bid 10 mL 1   Insulin  Syringe-Needle U-100 (INSULIN  SYRINGE .5CC/31GX5/16) 31G X 5/16 0.5 ML MISC Take insulin  4  times a day. 100 each 3   losartan  (COZAAR ) 25 MG tablet Take 1 tablet (25 mg total) by mouth daily. Hold this medication if your systolic blood pressure ( top number) s less than 100. Please call your doctor is your systolic blood pressure is less than 100 90 tablet 3   metoprolol  tartrate (LOPRESSOR ) 25 MG tablet Take 0.5 tablets (12.5 mg total) by mouth 2 (two) times daily. 45 tablet 3   ondansetron  (ZOFRAN ) 4 MG tablet Take 1 tablet (4 mg total) by mouth every 8 (eight) hours as needed for nausea or vomiting. 90 tablet 1   oxyCODONE -acetaminophen  (PERCOCET) 10-325 MG tablet Take 1 tablet by mouth 3 (three) times daily as needed.     polyethylene glycol (MIRALAX  / GLYCOLAX ) 17 g packet Take 17 g by mouth 2 (two) times daily. (Patient taking differently: Take 17 g by mouth daily as needed.) 14 each 0   pregabalin  (LYRICA ) 150 MG capsule Take 1 capsule (150 mg total) by mouth in the morning, at noon, and at bedtime. 270 capsule 3   rosuvastatin  (CRESTOR ) 20 MG tablet Take 1 tablet (20 mg total) by mouth daily. 90 tablet 3   Continuous Glucose Sensor (DEXCOM G7 SENSOR) MISC Use to check glucose continuously, change sensor every 10 days 9 each 3   insulin  aspart (NOVOLOG ) 100 UNIT/ML injection Use up to 50 units/day via insulin  pump. 30 mL 5   No current facility-administered medications for this visit.    PHYSICAL EXAM: Vitals:   02/12/24 1313  BP: 134/70  Pulse: 63  Resp: 20  SpO2: 99%  Weight: 183 lb (83 kg)  Height: 5' 1 (1.549 m)     Body mass index is 34.58 kg/m.  Wt Readings from Last 3 Encounters:  02/12/24 183 lb (83 kg)  01/29/24 184 lb (83.5 kg)  01/08/24 185 lb (83.9 kg)    General: Well developed, well nourished female in no apparent distress.  HEENT: AT/[place], no external lesions.  Eyes: Conjunctiva clear and no icterus. Neck: Neck supple  Lungs: Respirations not labored Neurologic: Alert, oriented, normal speech Extremities / Skin: Dry.    Diabetic Foot Exam -  Simple   No data filed    LABS Reviewed Lab Results  Component Value Date   HGBA1C 10.5 (A) 02/12/2024   HGBA1C 8.3 (A) 08/28/2023   HGBA1C 9.8 (A) 05/20/2023   No results found for: FRUCTOSAMINE Lab Results  Component Value Date   CHOL 183 08/09/2021   HDL 57.50 08/09/2021   LDLCALC 105 (H) 08/09/2021   LDLDIRECT 96.0 07/04/2022   TRIG 104.0 08/09/2021   CHOLHDL 3 08/09/2021   Lab Results  Component Value Date   MICRALBCREAT 165 (H) 05/06/2023   MICRALBCREAT 254 (H) 09/04/2016   Lab Results  Component Value Date   CREATININE 1.53 (H) 06/23/2023   Lab Results  Component Value Date   GFR 51.61 (L) 07/04/2022    ASSESSMENT / PLAN  1. Uncontrolled type 1 diabetes mellitus with hyperglycemia (HCC)    Diabetes Mellitus type 1, complicated by CAD/nephropathy/neuropathy/retinopathy. - Diabetic status / severity: Uncontrolled.  Worsening.  Lab Results  Component Value Date   HGBA1C 10.5 (A) 02/12/2024    - Hemoglobin A1c goal : <6.5%  She has  not been using OmniPod 5 lately.  She has been doing multidose insulin  regimen.  Discussed about compliance with diabetic medications.  Will stay on multidose insulin  regimen with a plan to resume insulin  pump therapy soon.  - Medications: See below.  I) continue Lantus  9 units 2 times a day. II) adjust NovoLog  10 to 12 units with meals up to 3 times a day.  She was to take insulin  with NovoLog  while not insulin  pen. II) Resume OmniPod 5 with Dexcom G7, using NovoLog  U100, when able.  She prefers diabetic educator to restart insulin  pump with Dexcom G7.  She does not feel comfortable using G7 by herself. -Sent referral to diabetic educator.  - Home glucose testing: CGM and check as needed.  Start Dexcom G7.  - Discussed/ Gave Hypoglycemia treatment plan.  # Consult : Diabetes educator.    # Annual urine for microalbuminuria/ creatinine ratio,microalbuminuria currently, continue ACE/ARB /losartan . Last  Lab Results   Component Value Date   MICRALBCREAT 165 (H) 05/06/2023    # Foot check nightly / neuropathy, continue pregabalin .  Discussed that improvement of diabetes may help with improving neuropathic symptoms.  On pregabalin  150 mg 3 times a day.  # Patient has diabetic retinopathy, advised to follow-up with ophthalmology regularly..   - Diet: Make healthy diabetic food choices - Life style / activity / exercise: Discussed.  2. Blood pressure  -  BP Readings from Last 1 Encounters:  02/12/24 134/70    - Control is in target.  - No change in current plans.  3. Lipid status / Hyperlipidemia - Last  Lab Results  Component Value Date   LDLCALC 105 (H) 08/09/2021   - Continue rosuvastatin  20 mg daily.  Managed by primary care provider /cardiology.  Diagnoses and all orders for this visit:  Uncontrolled type 1 diabetes mellitus with hyperglycemia (HCC) -     POCT glycosylated hemoglobin (Hb A1C) -     Insulin  Disposable Pump (OMNIPOD 5 DEXG7G6 PODS GEN 5) MISC; 1 each by Does not apply route every 3 (three) days. -     insulin  aspart (NOVOLOG ) 100 UNIT/ML injection; Use up to 50 units/day via insulin  pump. -     Insulin  Syringe-Needle U-100 (INSULIN  SYRINGE .5CC/31GX5/16) 31G X 5/16 0.5 ML MISC; Take insulin  4 times a day. -     Continuous Glucose Sensor (DEXCOM G7 SENSOR) MISC; Use to check glucose continuously, change sensor every 10 days -     Amb Referral to Nutrition and Diabetic Education -     glucose blood (CONTOUR NEXT TEST) test strip; Use to test blood glucose before meals and at bedtime     DISPOSITION Follow up in clinic in 6 to 8 weeks suggested.   All questions answered and patient verbalized understanding of the plan.  Yvette Parmvir Boomer, MD [facility] Endocrinology [facility] [address], [address] [place], [place] [address] Phone # [phone]  At least part of this note was generated using voice recognition software. Inadvertent word errors may have  occurred, which were not recognized during the proofreading process.

## 2024-02-13 LAB — HIV ANTIBODY (ROUTINE TESTING W REFLEX): HIV Screen 4th Generation wRfx: NONREACTIVE

## 2024-02-13 LAB — TREPONEMAL ANTIBODIES, TPPA: Treponemal Antibodies, TPPA: REACTIVE — AB

## 2024-02-13 LAB — HCV INTERPRETATION

## 2024-02-13 LAB — RPR W/REFLEX TO TREPSURE: RPR: NONREACTIVE

## 2024-02-13 LAB — HCV AB W REFLEX TO QUANT PCR: HCV Ab: NONREACTIVE

## 2024-02-18 ENCOUNTER — Ambulatory Visit: Admitting: Nutrition

## 2024-02-19 ENCOUNTER — Telehealth: Payer: Self-pay

## 2024-02-19 ENCOUNTER — Encounter: Attending: Endocrinology | Admitting: Nutrition

## 2024-02-19 ENCOUNTER — Telehealth: Payer: Self-pay | Admitting: Nutrition

## 2024-02-19 DIAGNOSIS — E1069 Type 1 diabetes mellitus with other specified complication: Secondary | ICD-10-CM | POA: Insufficient documentation

## 2024-02-19 DIAGNOSIS — E785 Hyperlipidemia, unspecified: Secondary | ICD-10-CM | POA: Diagnosis present

## 2024-02-19 NOTE — Telephone Encounter (Signed)
 Patient reports that she went to pick up her insulin  and [facility] they arei requesting a prior auth from Medicare for her insulin , since it must now go through medicare.  Please expedite this ASAP

## 2024-02-19 NOTE — Telephone Encounter (Addendum)
 Patient in today crying and asking front staff why no one has call her with her lab results. Voiced she know their back. I Apologized to the patient and advised that her provider CMA was in the process of make her lab calls. I reviewed lab results with pt. Per PCP Please inform her that she tested negative for hepatitis C, negative for HIV.  Test for syphilis reveals one negative and one positive and so because this is inconclusive we need to repeat this in 4 weeks to make sure that this is not a false positive.   Patient continue asking the questions to get clarity to why one test for syphilis was positive and one negative. Patient voiced that she had been treated for syphilis before. I asked if she shared this information with her provider she said she had not, because she was told she was cured. Advised that this information is very important and effects the way her provider interprets her test results. Advised patient that I would let her PCP know and someone would contact if needed. Patient voiced understanding. Rounting to provider for advisement.

## 2024-02-19 NOTE — Progress Notes (Unsigned)
 Patient is here today to switch from the dexcom G6 sensors to the G7 sensors and needs help with changing settings in her PDM.  She had just put on a new pod this AM, but this sensor change requires a new pod change as well.  She did not bring a new pod or insulin .  Since the G6 sensor had expired, she downloaded the app to her phone and started the G7 sensor.  She will use this pod up by putting in blood sugar readings from the phone to her bolus calculator on her PDM.  Once her pod expires in 3 days, written instructions were given to her on how to switch the settings on the PDM to use the G7 sensors.  She will then change her pod and link the new pod to the PDM which will be linked to the G7 sensor.  We reviewed the steps for this X3.  She will call OmniPod if she has questions.  Telephone number given

## 2024-02-19 NOTE — Telephone Encounter (Signed)
 I gave her a call but could not reach her and I could not leave a message because her voicemail was full.  Given previous treatment for syphilis that explains one of the test being positive indicating previous exposure but RPR is negative and that is what actually matters meaning she does not have any active infection.  She does not need to repeat the test.  Thanks.

## 2024-02-20 NOTE — Patient Instructions (Signed)
 Enter blood sugar readings into the bolus calculator from the dexcom G7 app on your phone Once your current pod expires, before putting on a new pod, make changes to your PDM to switch to G7 sensor, press enter new, take picture of QR code, start sensor, then fill new pod  Call OmniPod help line if questions.

## 2024-02-21 NOTE — Telephone Encounter (Signed)
Call to patient. VM left.

## 2024-02-25 NOTE — Telephone Encounter (Signed)
 Spoke with patient. Patient advised per PCP   Given previous treatment for syphilis that explains one of the test being positive indicating previous exposure but RPR is negative and that is what actually matters meaning she does not have any active infection.  She does not need to repeat the test. Patient denies having any questions.

## 2024-02-26 ENCOUNTER — Telehealth: Payer: Self-pay | Admitting: Endocrinology

## 2024-02-26 NOTE — Telephone Encounter (Signed)
 I spoke with the patient and I advised her that I would reach out to the pharmacy to see what's going on. Because her refill was sent on 02/12/24

## 2024-02-26 NOTE — Telephone Encounter (Signed)
 Patient has no insulin  (is using someone else's) and pharmacy wont fill the RX that they have - per pt something to do with how the RX is written.  Left VM yesterday but has not heard anything back.

## 2024-02-26 NOTE — Telephone Encounter (Addendum)
 I was able to speak with the pharmacist and she looked into this and come to find out they were running it under her Part B benefit and it was asking for a CMN form to be filled out (that we did not receive), but if she got it under her regular [employer] pharmacy benefit it would be $70 and they could get that for her an have it ready by 4:30-5pm.   I called and advised the pt of the information above and she voiced understanding.

## 2024-02-28 ENCOUNTER — Other Ambulatory Visit: Payer: Self-pay

## 2024-02-28 ENCOUNTER — Encounter (HOSPITAL_COMMUNITY): Payer: Self-pay | Admitting: *Deleted

## 2024-02-28 ENCOUNTER — Emergency Department (HOSPITAL_COMMUNITY)

## 2024-02-28 ENCOUNTER — Inpatient Hospital Stay (HOSPITAL_COMMUNITY)
Admission: EM | Admit: 2024-02-28 | Discharge: 2024-03-04 | DRG: 617 | Disposition: A | Discharge status: Home Health | Attending: Family Medicine | Admitting: Family Medicine

## 2024-02-28 DIAGNOSIS — M8610 Other acute osteomyelitis, unspecified site: Secondary | ICD-10-CM

## 2024-02-28 DIAGNOSIS — M009 Pyogenic arthritis, unspecified: Secondary | ICD-10-CM | POA: Diagnosis present

## 2024-02-28 DIAGNOSIS — E1065 Type 1 diabetes mellitus with hyperglycemia: Secondary | ICD-10-CM | POA: Diagnosis present

## 2024-02-28 DIAGNOSIS — E872 Acidosis, unspecified: Secondary | ICD-10-CM | POA: Diagnosis present

## 2024-02-28 DIAGNOSIS — I252 Old myocardial infarction: Secondary | ICD-10-CM | POA: Diagnosis not present

## 2024-02-28 DIAGNOSIS — Z7982 Long term (current) use of aspirin: Secondary | ICD-10-CM

## 2024-02-28 DIAGNOSIS — Z6834 Body mass index (BMI) 34.0-34.9, adult: Secondary | ICD-10-CM | POA: Diagnosis not present

## 2024-02-28 DIAGNOSIS — E10621 Type 1 diabetes mellitus with foot ulcer: Secondary | ICD-10-CM | POA: Diagnosis present

## 2024-02-28 DIAGNOSIS — E785 Hyperlipidemia, unspecified: Secondary | ICD-10-CM | POA: Diagnosis present

## 2024-02-28 DIAGNOSIS — L89616 Pressure-induced deep tissue damage of right heel: Secondary | ICD-10-CM | POA: Diagnosis present

## 2024-02-28 DIAGNOSIS — I130 Hypertensive heart and chronic kidney disease with heart failure and stage 1 through stage 4 chronic kidney disease, or unspecified chronic kidney disease: Secondary | ICD-10-CM | POA: Diagnosis present

## 2024-02-28 DIAGNOSIS — Z833 Family history of diabetes mellitus: Secondary | ICD-10-CM

## 2024-02-28 DIAGNOSIS — M869 Osteomyelitis, unspecified: Principal | ICD-10-CM | POA: Diagnosis present

## 2024-02-28 DIAGNOSIS — E875 Hyperkalemia: Secondary | ICD-10-CM | POA: Diagnosis present

## 2024-02-28 DIAGNOSIS — F418 Other specified anxiety disorders: Secondary | ICD-10-CM | POA: Diagnosis not present

## 2024-02-28 DIAGNOSIS — E1043 Type 1 diabetes mellitus with diabetic autonomic (poly)neuropathy: Secondary | ICD-10-CM | POA: Diagnosis present

## 2024-02-28 DIAGNOSIS — E1022 Type 1 diabetes mellitus with diabetic chronic kidney disease: Secondary | ICD-10-CM | POA: Diagnosis present

## 2024-02-28 DIAGNOSIS — Z8249 Family history of ischemic heart disease and other diseases of the circulatory system: Secondary | ICD-10-CM

## 2024-02-28 DIAGNOSIS — E1069 Type 1 diabetes mellitus with other specified complication: Principal | ICD-10-CM | POA: Diagnosis present

## 2024-02-28 DIAGNOSIS — K3184 Gastroparesis: Secondary | ICD-10-CM | POA: Diagnosis present

## 2024-02-28 DIAGNOSIS — Z8349 Family history of other endocrine, nutritional and metabolic diseases: Secondary | ICD-10-CM

## 2024-02-28 DIAGNOSIS — Z951 Presence of aortocoronary bypass graft: Secondary | ICD-10-CM

## 2024-02-28 DIAGNOSIS — Z880 Allergy status to penicillin: Secondary | ICD-10-CM | POA: Diagnosis not present

## 2024-02-28 DIAGNOSIS — N1831 Chronic kidney disease, stage 3a: Secondary | ICD-10-CM | POA: Diagnosis present

## 2024-02-28 DIAGNOSIS — E66811 Obesity, class 1: Secondary | ICD-10-CM | POA: Diagnosis present

## 2024-02-28 DIAGNOSIS — E1040 Type 1 diabetes mellitus with diabetic neuropathy, unspecified: Secondary | ICD-10-CM

## 2024-02-28 DIAGNOSIS — Z9641 Presence of insulin pump (external) (internal): Secondary | ICD-10-CM | POA: Diagnosis present

## 2024-02-28 DIAGNOSIS — I7090 Unspecified atherosclerosis: Secondary | ICD-10-CM | POA: Diagnosis not present

## 2024-02-28 DIAGNOSIS — M868X7 Other osteomyelitis, ankle and foot: Secondary | ICD-10-CM | POA: Diagnosis not present

## 2024-02-28 DIAGNOSIS — M86172 Other acute osteomyelitis, left ankle and foot: Secondary | ICD-10-CM | POA: Diagnosis present

## 2024-02-28 DIAGNOSIS — I2510 Atherosclerotic heart disease of native coronary artery without angina pectoris: Secondary | ICD-10-CM | POA: Diagnosis present

## 2024-02-28 DIAGNOSIS — K219 Gastro-esophageal reflux disease without esophagitis: Secondary | ICD-10-CM | POA: Diagnosis present

## 2024-02-28 LAB — COMPREHENSIVE METABOLIC PANEL WITH GFR
ALT: 16 U/L (ref 0–44)
AST: 26 U/L (ref 15–41)
Albumin: 3.5 g/dL (ref 3.5–5.0)
Alkaline Phosphatase: 106 U/L (ref 38–126)
Anion gap: 16 — ABNORMAL HIGH (ref 5–15)
BUN: 18 mg/dL (ref 6–20)
CO2: 17 mmol/L — ABNORMAL LOW (ref 22–32)
Calcium: 9.5 mg/dL (ref 8.9–10.3)
Chloride: 104 mmol/L (ref 98–111)
Creatinine, Ser: 1.48 mg/dL — ABNORMAL HIGH (ref 0.44–1.00)
GFR, Estimated: 41 mL/min — ABNORMAL LOW (ref >60)
Glucose, Bld: 153 mg/dL — ABNORMAL HIGH (ref 70–99)
Potassium: 4.8 mmol/L (ref 3.5–5.1)
Sodium: 137 mmol/L (ref 135–145)
Total Bilirubin: 0.9 mg/dL (ref 0.0–1.2)
Total Protein: 7.8 g/dL (ref 6.5–8.1)

## 2024-02-28 LAB — I-STAT CHEM 8, ED
BUN: 24 mg/dL — ABNORMAL HIGH (ref 6–20)
Calcium, Ion: 1.07 mmol/L — ABNORMAL LOW (ref 1.15–1.40)
Chloride: 109 mmol/L (ref 98–111)
Creatinine, Ser: 1.4 mg/dL — ABNORMAL HIGH (ref 0.44–1.00)
Glucose, Bld: 159 mg/dL — ABNORMAL HIGH (ref 70–99)
HCT: 39 % (ref 36.0–46.0)
Hemoglobin: 13.3 g/dL (ref 12.0–15.0)
Potassium: 5.2 mmol/L — ABNORMAL HIGH (ref 3.5–5.1)
Sodium: 136 mmol/L (ref 135–145)
TCO2: 21 mmol/L — ABNORMAL LOW (ref 22–32)

## 2024-02-28 LAB — CBC
HCT: 38.7 % (ref 36.0–46.0)
Hemoglobin: 13 g/dL (ref 12.0–15.0)
MCH: 28 pg (ref 26.0–34.0)
MCHC: 33.6 g/dL (ref 30.0–36.0)
MCV: 83.2 fL (ref 80.0–100.0)
Platelets: 237 K/uL (ref 150–400)
RBC: 4.65 MIL/uL (ref 3.87–5.11)
RDW: 14.8 % (ref 11.5–15.5)
WBC: 5.9 K/uL (ref 4.0–10.5)
nRBC: 0 % (ref 0.0–0.2)

## 2024-02-28 LAB — I-STAT CG4 LACTIC ACID, ED
Lactic Acid, Venous: 1.4 mmol/L (ref 0.5–1.9)
Lactic Acid, Venous: 1 mmol/L (ref 0.5–1.9)

## 2024-02-28 MED ORDER — ENOXAPARIN SODIUM 40 MG/0.4ML IJ SOSY
40.0000 mg | PREFILLED_SYRINGE | INTRAMUSCULAR | Status: DC
  Administered 2024-02-29 – 2024-03-03 (×4): 40 mg via SUBCUTANEOUS
  Filled 2024-02-28 (×4): qty 0.4

## 2024-02-28 MED ORDER — SODIUM CHLORIDE 0.9 % IV SOLN
2.0000 g | Freq: Once | INTRAVENOUS | Status: AC
  Administered 2024-02-28: 2 g via INTRAVENOUS
  Filled 2024-02-28: qty 20

## 2024-02-28 MED ORDER — VANCOMYCIN HCL IN DEXTROSE 1-5 GM/200ML-% IV SOLN: 1000.0000 mg | Freq: Once | INTRAVENOUS | Status: DC

## 2024-02-28 NOTE — ED Notes (Signed)
 Unable to obtain labs

## 2024-02-28 NOTE — ED Triage Notes (Signed)
 Pt states she has a wound on her left big toe that needs to be checked. Pt is a type 1 diabetic.

## 2024-02-28 NOTE — ED Notes (Signed)
 PT stuck multiple times. IV team requested.

## 2024-02-28 NOTE — ED Provider Notes (Signed)
 [place] [facility] AT [facility] Provider Note   CSN: [id number] Arrival date & time: 02/28/24  1656     History Chief Complaint  Patient presents with   Wound Check    HPI: [name] is a 58 y.o. female with history pertinent for T1DM, HTN, GERD, HLD, CAD, diabetic neuropathy, gastroparesis, CHF who presents complaining of left great toe wound. History provided by patient.  No interpreter required during this encounter.  Patient reports that approximately 1 month ago she had a lesion on the dorsum of her left great toe that looked like a pimple.  Reports that the lesion ruptured spontaneously and had bloody and purulent discharge.  Reports that she went to her primary care doctor who put her on a course of clindamycin , which she has been taking for 3 to 4 weeks, and reports adherence to this medication.  Reports that despite the clindamycin  she has had progression of swelling and discoloration of the toe, and she found the discoloration particularly distressing, therefore she came to the emergency department for evaluation.  Denies fever, chills, chest pain, shortness of breath, nausea, vomiting, diarrhea.  Reports that she also has a wound on her right heel that she believes is a pressure injury from when she slept in an uncomfortable bed in a hotel several weeks ago.  Patient's recorded medical, surgical, social, medication list and allergies were reviewed in the Snapshot window as part of the initial history.   Prior to Admission medications   Medication Sig Start Date End Date Taking? Authorizing Provider  acetaminophen  (TYLENOL ) 500 MG tablet Take 1,000 mg by mouth at bedtime as needed for mild pain.    [provider]  ammonium lactate  (AMLACTIN) 12 % cream Apply 1 Application topically as needed for dry skin. 01/24/23   McCaughan, Dia D, DPM  aspirin  EC 81 MG tablet Take 1 tablet (81 mg total) by mouth daily. 06/03/18   Nahser, Aleene PARAS, MD   Continuous Glucose Sensor (DEXCOM G7 SENSOR) MISC Use to check glucose continuously, change sensor every 10 days 02/12/24   Thapa, [place], MD  famotidine  (PEPCID ) 40 MG tablet  04/13/23   [provider]  furosemide  (LASIX ) 40 MG tablet Take 1 tablet (40 mg total) by mouth daily. 10/04/23   Nahser, Aleene PARAS, MD  glucose blood (CONTOUR NEXT TEST) test strip Use to test blood glucose before meals and at bedtime 02/12/24   Thapa, [place], MD  insulin  aspart (NOVOLOG ) 100 UNIT/ML injection Use up to 50 units/day via insulin  pump. 02/12/24   Thapa, [place], MD  Insulin  Disposable Pump (OMNIPOD 5 DEXG7G6 PODS GEN 5) MISC 1 each by Does not apply route every 3 (three) days. 02/12/24   Thapa, [place], MD  insulin  glargine (LANTUS ) 100 UNIT/ML injection Lantus  9 units bid 05/20/23   Thapa, [place], MD  Insulin  Syringe-Needle U-100 (INSULIN  SYRINGE .5CC/31GX5/16) 31G X 5/16 0.5 ML MISC Take insulin  4 times a day. 02/12/24   Thapa, [place], MD  losartan  (COZAAR ) 25 MG tablet Take 1 tablet (25 mg total) by mouth daily. Hold this medication if your systolic blood pressure ( top number) s less than 100. Please call your doctor is your systolic blood pressure is less than 100 10/04/23   Nahser, Aleene PARAS, MD  metoprolol  tartrate (LOPRESSOR ) 25 MG tablet Take 0.5 tablets (12.5 mg total) by mouth 2 (two) times daily. 10/04/23   Nahser, Aleene PARAS, MD  ondansetron  (ZOFRAN ) 4 MG tablet Take 1 tablet (4 mg total)  by mouth every 8 (eight) hours as needed for nausea or vomiting. 10/31/22   Newlin, Enobong, MD  oxyCODONE -acetaminophen  (PERCOCET) 10-325 MG tablet Take 1 tablet by mouth 3 (three) times daily as needed. 06/15/23   [provider]  polyethylene glycol (MIRALAX  / GLYCOLAX ) 17 g packet Take 17 g by mouth 2 (two) times daily. Patient taking differently: Take 17 g by mouth daily as needed. 05/26/23   Tawkaliyar, Roya, DO  pregabalin  (LYRICA ) 150 MG capsule Take 1 capsule (150 mg total) by mouth in the morning, at noon,  and at bedtime. 08/28/23   Thapa, [place], MD  rosuvastatin  (CRESTOR ) 20 MG tablet Take 1 tablet (20 mg total) by mouth daily. 10/04/23   Nahser, Aleene PARAS, MD     Allergies: Anesthetics, amide; Chlorhexidine ; Penicillins; Enkaid [encainide]; Reglan  [metoclopramide ]; and Subutex [buprenorphine]   Review of Systems   ROS as per HPI  Physical Exam Updated Vital Signs BP (!) 167/152   Pulse 75   Temp 98.1 F (36.7 C) (Oral)   Resp 18   Ht 5' 1 (1.549 m)   Wt 83 kg   SpO2 100%   BMI 34.57 kg/m  Physical Exam Vitals and nursing note reviewed.  Constitutional:      General: She is not in acute distress.    Appearance: She is well-developed.  HENT:     Head: Normocephalic and atraumatic.  Eyes:     Conjunctiva/sclera: Conjunctivae normal.  Cardiovascular:     Rate and Rhythm: Normal rate and regular rhythm.     Heart sounds: No murmur heard. Pulmonary:     Effort: Pulmonary effort is normal. No respiratory distress.     Breath sounds: Normal breath sounds.  Abdominal:     Palpations: Abdomen is soft.     Tenderness: There is no abdominal tenderness.  Musculoskeletal:     Cervical back: Neck supple.     Comments: Right foot with purple/black discoloration at the posterior heel, see image.  Left foot with swelling and hyperpigmentation of the dorsum of the toe with 2 ulcers evident, see image  Skin:    General: Skin is warm and dry.     Capillary Refill: Capillary refill takes less than 2 seconds.  Neurological:     Mental Status: She is alert.  Psychiatric:        Mood and Affect: Mood normal.        ED Course/ Medical Decision Making/ A&P    Procedures Procedures   Medications Ordered in ED Medications  vancomycin  (VANCOCIN ) IVPB 1000 mg/200 mL premix (has no administration in time range)  cefTRIAXone  (ROCEPHIN ) 2 g in sodium chloride  0.9 % 100 mL IVPB (2 g Intravenous New Bag/Given 02/28/24 2320)    Medical Decision Making:   [name] is a 58 y.o. female  who presents for as per above.  Physical exam is pertinent for right foot with purple/black discoloration at the posterior heel, left foot with swelling and hyperpigmentation of the dorsum of the toe with 2 ulcers evident.  The differential includes but is not limited to osteomyelitis, diabetic foot wound, failure of outpatient antibiotics, abscess, cellulitis.  Independent historian: None  External data reviewed: Notes: Reviewed patient's prior notes, I do see notes regarding patient's gynecological health, I do not see any recent notes describing patient's foot wound and prescribing clindamycin  as patient reported.  Additionally reviewed patient's prior microbiology data, history of Klebsiella UTI, however no prior positive blood culture or bone biopsies on my review of  past 2 years of microbiology data  Labs: Ordered, Independent interpretation, and Details: CBC without leukocytosis, anemia, thrombocytopenia.  CMP with elevation of creatinine, which appears to be baseline for patient.  No elevation of BUN.  No emergent electrolyte derangement or emergent LFT abnormality.  Lactic acid WNL.  Radiology: Ordered, Independent interpretation, Details: Cortical irregularities of the distal aspect of the first proximal phalanx and proximal aspect of the first distal phalanx concerning for osteomyelitis, and All images reviewed independently.  Agree with radiology report at this time.   DG Foot Complete Left Result Date: 02/28/2024 CLINICAL DATA:  Swelling and ulceration of the great toe, left foot wound infection, diabetes EXAM: LEFT FOOT - COMPLETE 3+ VIEW COMPARISON:  07/08/2018 FINDINGS: Abnormal erosions and bony destructive findings in the head of the proximal phalanx great toe. Mild lucency/erosion along the proximal cortical margin of portions of the distal phalanx of the great toe. Appearance suspicious for active osteomyelitis/septic interphalangeal joint given the clinical context. Dorsal  subcutaneous edema in the forefoot. IMPRESSION: 1. Abnormal erosions and bony destructive findings in the head of the proximal phalanx of the great toe and base of the distal phalanx great toe, suspicious for active osteomyelitis/septic interphalangeal joint given the clinical context. 2. Dorsal subcutaneous edema in the forefoot. Electronically Signed   By: Ryan Salvage M.D.   On: 02/28/2024 18:51    EKG/Medicine tests: Not indicated EKG Interpretation:    Interventions: Vancomycin , ceftriaxone   See the EMR for full details regarding lab and imaging results.  Patient presents for diabetic foot wound which is progressing outpatient despite reported adherence to course of oral outpatient antibiotics.  Here in the emergency department, patient is vitally stable, labs obtained in triage and patient is well without leukocytosis or lactic acid elevation, doubt sepsis.  X-ray obtained and is consistent with osteomyelitis.  Blood cultures obtained, given history of penicillin allergy, will initiate patient on cephalosporin with Rocephin , as well as vancomycin  IV.  Medicine consulted for admission and patient accepted to hospitalist by Dr. Dorinda, who accepted the patient to medicine and requested podiatry consult.  Podiatry consulted, agreed with admission and stated that they will evaluate patient in the AM.  Presentation is most consistent with acute complicated illness and Current presentation is complicated by underlying chronic conditions  Discussion of management or test interpretations with external provider(s): Hospitalist, podiatry  Risk Drugs:Prescription drug management Treatment: Decision regarding hospitalization  Disposition: ADMIT: I believe the patient requires admission for further care and management. The patient was admitted to hospitalists. Please see inpatient provider note for additional treatment plan details.   MDM generated using voice dictation software and may contain  dictation errors.  Please contact me for any clarification or with any questions.  Clinical Impression:  1. Osteomyelitis of toe (HCC)      Admit   Final Clinical Impression(s) / ED Diagnoses Final diagnoses:  Osteomyelitis of toe [facility])    Rx / DC Orders ED Discharge Orders     None        Rogelia Jerilynn RAMAN, MD 02/28/24 2343

## 2024-02-28 NOTE — ED Provider Triage Note (Signed)
 Emergency Medicine Provider Triage Evaluation Note  [name] , a 58 y.o. female  was evaluated in triage.  Pt complains of left great toe ulcer.  Review of Systems  Positive: Toe ulcer for 4 weeks, on abx for 3 weeks Negative: fever  Physical Exam  BP (!) 151/56 (BP Location: Left Arm)   Pulse 87   Temp 97.7 F (36.5 C)   Resp (!) 22   Ht 5' 1 (1.549 m)   Wt 83 kg   SpO2 96%   BMI 34.57 kg/m  Gen:   Awake, no distress   Resp:  Normal effort  MSK:   Moves extremities without difficulty  Other:    Medical Decision Making  Medically screening exam initiated at 5:53 PM.  Appropriate orders placed.  [name] was informed that the remainder of the evaluation will be completed by another provider, this initial triage assessment does not replace that evaluation, and the importance of remaining in the ED until their evaluation is complete.     Shermon Warren SAILOR, PA-C 02/28/24 1754

## 2024-02-28 NOTE — H&P (Signed)
 History and Physical    Patient: [name] FMW:[id number] DOB: 1965/12/04 DOA: 02/28/2024 DOS: the patient was seen and examined on 02/28/2024 PCP: Delbert Clam, MD  Patient coming from: Home  Chief Complaint: Left foot pain and infection Chief Complaint  Patient presents with   Wound Check   HPI: [name] is a 58 y.o. female with medical history significant of type 1 diabetes, anxiety and depression, coronary artery disease status post CABG, diabetic neuropathy, hypertension, GERD, hyperlipidemia.  Patient has been having left foot great toe infection for the last 3 weeks and followed up with the primary care physician and was initiated on antibiotic therapy however despite compliance patient noted worsening with color change of the big toe and therefore came in for further management. According to the patient she has been having swelling with pain of intensity 8/10 located in the left great toe.  She denies nausea vomiting abdominal pain chest pain fever cough or urinary complaints.  ED course: Upon arrival to the emergency room temperature 97.7 respiratory rate 22, pulse 87, BP 151/56 saturating 96% on room air X-ray of the left foot showed finding of erosions and bony destruction involving the proximal phalanx of the great toe as well as in the distal phalanx of the great toe suspicious for active osteomyelitis.  Given above concerns [facility] service was therefore contacted to admit patient for further management  Review of Systems: As mentioned in the history of present illness. All other systems reviewed and are negative. Past Medical History:  Diagnosis Date   AKI (acute kidney injury) (HCC)    Anemia, iron  deficiency    Anxiety and depression 05/18/2015   CAD (coronary artery disease), native coronary artery 01/11/2018   s/p CABG - Non-STEMI with severe three-vessel disease noted July 2019   Depression    Diabetic gastroparesis (HCC)    Diabetic neuropathy, type I diabetes  mellitus (HCC) 05/18/2015   DKA, type 1 (HCC) 03/18/2016   Essential hypertension    Gastroparesis    GERD (gastroesophageal reflux disease)    HLD (hyperlipidemia)    MDD (major depressive disorder), single episode, severe , no psychosis (HCC)    Tardive dyskinesia    Vitamin B12 deficiency 08/16/2015   Past Surgical History:  Procedure Laterality Date   CARDIAC SURGERY     COLONOSCOPY  09/27/2014   at [facility]   CORONARY ARTERY BYPASS GRAFT N/A 01/14/2018   Procedure: CORONARY ARTERY BYPASS GRAFTING (CABG)X3, RIGHT AND LEFT SAPHENOUS VEIN HARVEST, MAMMARY TAKE DOWN. MAMMARY TO LAD, SVG TO PD, SVG TO DISTAL CIRC.;  Surgeon: Army Dallas NOVAK, MD;  Location: [facility] OR;  Service: Open Heart Surgery;  Laterality: N/A;   ESOPHAGOGASTRODUODENOSCOPY  09/27/2014   at [facility], Dr Gretel Crigler. biospy neg for celiac, neg for H pylori.    EYE SURGERY     gailstones     INTRAMEDULLARY (IM) NAIL INTERTROCHANTERIC Right 05/10/2019   Procedure: INTRAMEDULLARY (IM) NAIL INTERTROCHANTRIC;  Surgeon: Leora Lynwood SAUNDERS, MD;  Location: [facility] ORS;  Service: Orthopedics;  Laterality: Right;   IR FLUORO GUIDE CV LINE RIGHT  02/01/2017   IR FLUORO GUIDE CV LINE RIGHT  03/06/2017   IR FLUORO GUIDE CV LINE RIGHT  03/25/2017   IR GENERIC HISTORICAL  01/24/2016   IR FLUORO GUIDE CV LINE RIGHT 01/24/2016 Darrell K Allred, PA-C [facility]   IR GENERIC HISTORICAL  01/24/2016   IR US  GUIDE VASC ACCESS RIGHT 01/24/2016 Darrell K Allred, PA-C [facility]   IR US  GUIDE VASC  ACCESS RIGHT  02/01/2017   IR US  GUIDE VASC ACCESS RIGHT  03/06/2017   IR US  GUIDE VASC ACCESS RIGHT  03/25/2017   IR US  GUIDE VASC ACCESS RIGHT  01/20/2019   IR VENIPUNCTURE 1YRS/OLDER BY MD  01/20/2019   LEFT HEART CATH AND CORONARY ANGIOGRAPHY N/A 01/07/2018   Procedure: LEFT HEART CATH AND CORONARY ANGIOGRAPHY;  Surgeon: Burnard Debby LABOR, MD;  Location: [facility] INVASIVE CV LAB;  Service: Cardiovascular;  Laterality: N/A;   POSTERIOR VITRECTOMY AND MEMBRANE PEEL-LEFT EYE   09/28/2002   POSTERIOR VITRECTOMY AND MEMBRANE PEEL-RIGHT EYE  03/16/2002   RETINAL DETACHMENT SURGERY     TEE WITHOUT CARDIOVERSION N/A 01/14/2018   Procedure: TRANSESOPHAGEAL ECHOCARDIOGRAM (TEE);  Surgeon: Army Dallas NOVAK, MD;  Location: [facility] OR;  Service: Open Heart Surgery;  Laterality: N/A;   Social History:  reports that she has never smoked. She has never used smokeless tobacco. She reports that she does not currently use drugs after having used the following drugs: Marijuana. She reports that she does not drink alcohol .  Allergies  Allergen Reactions   Anesthetics, Amide Nausea And Vomiting   Chlorhexidine  Other (See Comments)    Unknown reaction   Penicillins Diarrhea, Nausea And Vomiting and Other (See Comments)   Enkaid [Encainide] Nausea And Vomiting   Reglan  [Metoclopramide ] Swelling and Other (See Comments)    Dystonia Muscle rigidity Slurred speech Tongue swelling   Subutex [Buprenorphine] Rash    Family History  Problem Relation Age of Onset   Cystic fibrosis Mother    Hypertension Father    Diabetes Brother    Hypertension Maternal Grandmother     Prior to Admission medications   Medication Sig Start Date End Date Taking? Authorizing Provider  acetaminophen  (TYLENOL ) 500 MG tablet Take 1,000 mg by mouth at bedtime as needed for mild pain.    [provider]  ammonium lactate  (AMLACTIN) 12 % cream Apply 1 Application topically as needed for dry skin. 01/24/23   McCaughan, Dia D, DPM  aspirin  EC 81 MG tablet Take 1 tablet (81 mg total) by mouth daily. 06/03/18   Nahser, Aleene PARAS, MD  Continuous Glucose Sensor (DEXCOM G7 SENSOR) MISC Use to check glucose continuously, change sensor every 10 days 02/12/24   Thapa, [place], MD  famotidine  (PEPCID ) 40 MG tablet  04/13/23   [provider]  furosemide  (LASIX ) 40 MG tablet Take 1 tablet (40 mg total) by mouth daily. 10/04/23   Nahser, Aleene PARAS, MD  glucose blood (CONTOUR NEXT TEST) test strip Use to test  blood glucose before meals and at bedtime 02/12/24   Thapa, [place], MD  insulin  aspart (NOVOLOG ) 100 UNIT/ML injection Use up to 50 units/day via insulin  pump. 02/12/24   Thapa, [place], MD  Insulin  Disposable Pump (OMNIPOD 5 DEXG7G6 PODS GEN 5) MISC 1 each by Does not apply route every 3 (three) days. 02/12/24   Thapa, [place], MD  insulin  glargine (LANTUS ) 100 UNIT/ML injection Lantus  9 units bid 05/20/23   Thapa, [place], MD  Insulin  Syringe-Needle U-100 (INSULIN  SYRINGE .5CC/31GX5/16) 31G X 5/16 0.5 ML MISC Take insulin  4 times a day. 02/12/24   Thapa, [place], MD  losartan  (COZAAR ) 25 MG tablet Take 1 tablet (25 mg total) by mouth daily. Hold this medication if your systolic blood pressure ( top number) s less than 100. Please call your doctor is your systolic blood pressure is less than 100 10/04/23   Nahser, Aleene PARAS, MD  metoprolol  tartrate (LOPRESSOR ) 25 MG tablet Take 0.5 tablets (  12.5 mg total) by mouth 2 (two) times daily. 10/04/23   Nahser, Aleene PARAS, MD  ondansetron  (ZOFRAN ) 4 MG tablet Take 1 tablet (4 mg total) by mouth every 8 (eight) hours as needed for nausea or vomiting. 10/31/22   Newlin, Enobong, MD  oxyCODONE -acetaminophen  (PERCOCET) 10-325 MG tablet Take 1 tablet by mouth 3 (three) times daily as needed. 06/15/23   [provider]  polyethylene glycol (MIRALAX  / GLYCOLAX ) 17 g packet Take 17 g by mouth 2 (two) times daily. Patient taking differently: Take 17 g by mouth daily as needed. 05/26/23   Tawkaliyar, Roya, DO  pregabalin  (LYRICA ) 150 MG capsule Take 1 capsule (150 mg total) by mouth in the morning, at noon, and at bedtime. 08/28/23   Thapa, [place], MD  rosuvastatin  (CRESTOR ) 20 MG tablet Take 1 tablet (20 mg total) by mouth daily. 10/04/23   Nahser, Aleene PARAS, MD    Physical Exam: Vitals:   02/28/24 1720 02/28/24 2140 02/28/24 2145 02/28/24 2200  BP:  (!) 171/145 (!) 154/67 (!) 170/66  Pulse:  79 74 81  Resp:  (!) 24 (!) 26 (!) 26  Temp:  98.1 F (36.7 C)    TempSrc:  Oral     SpO2:  100% 100% 100%  Weight: 83 kg     Height: 5' 1 (1.549 m)      General: Middle-aged female laying in bed in no acute distress CNS: Alert and oriented x 3 moving all extremities CVS: S1-S2 present Respiratory: Clear to auscultation bilaterally Abdomen: Nontender moves with respiration  Musculoskeletal: Swelling, tenderness of left great toe with bluish discoloration Psych: Normal mood  Data Reviewed:  X-ray of the left foot showing findings of bony destruction of the proximal phalanx of the left great toe concerning for active osteomyelitis      Latest Ref Rng & Units 02/28/2024    8:17 PM 02/28/2024    8:10 PM 06/21/2023   11:53 AM  CBC  WBC 4.0 - 10.5 K/uL  5.9  5.9   Hemoglobin 12.0 - 15.0 g/dL 86.6  86.9  85.8   Hematocrit 36.0 - 46.0 % 39.0  38.7  42.8   Platelets 150 - 400 K/uL  237  246        Latest Ref Rng & Units 02/28/2024    8:17 PM 02/28/2024    8:10 PM 06/23/2023    6:28 AM  BMP  Glucose 70 - 99 mg/dL 840  846  758   BUN 6 - 20 mg/dL 24  18  24    Creatinine 0.44 - 1.00 mg/dL 8.59  8.51  8.46   Sodium 135 - 145 mmol/L 136  137  138   Potassium 3.5 - 5.1 mmol/L 5.2  4.8  4.4   Chloride 98 - 111 mmol/L 109  104  107   CO2 22 - 32 mmol/L  17  22   Calcium  8.9 - 10.3 mg/dL  9.5  9.0      Assessment and Plan:  Acute osteomyelitis of the left great toe Patient has failed outpatient antibiotic therapy Continue vancomycin  and cefepime  per pharmacy dosing ED physician consulted podiatry I will make patient n.p.o. for possible surgical intervention Follow-up on culture results  Hyperkalemia Patient presented with potassium of 5.2 Received a dose of Lokelma  Monitor potassium level closely  CKD stage III 3a Patient creatinine at baseline Continue to monitor renal function closely  Type I diabetes Last A1c 10.28 January 2024 Continue glargine as well as SSI Glucose  closely  Coronary artery disease status post CABG Continue statin therapy, aspirin  and  Lopressor   Diabetic neuropathy Continue pregabalin   Hypertension Continue losartan   GERD Continue PPI  Hyperlipidemia-continue rosuvastatin     Advance Care Planning:   Code Status: Prior full code  Consults: Podiatry  Family Communication: Discussed with patient's friend present at bedside  Severity of Illness: The appropriate patient status for this patient is INPATIENT. Inpatient status is judged to be reasonable and necessary in order to provide the required intensity of service to ensure the patient's safety. The patient's presenting symptoms, physical exam findings, and initial radiographic and laboratory data in the context of their chronic comorbidities is felt to place them at high risk for further clinical deterioration. Furthermore, it is not anticipated that the patient will be medically stable for discharge from the hospital within 2 midnights of admission.   * I certify that at the point of admission it is my clinical judgment that the patient will require inpatient hospital care spanning beyond 2 midnights from the point of admission due to high intensity of service, high risk for further deterioration and high frequency of surveillance required.*  Author: Drue ONEIDA Potter, MD 02/28/2024 10:25 PM  For on call review [email or web address].

## 2024-02-28 NOTE — ED Notes (Signed)
 Report given.

## 2024-02-28 NOTE — ED Notes (Signed)
 PT requesting medication to calm her anxiety.

## 2024-02-29 ENCOUNTER — Inpatient Hospital Stay (HOSPITAL_COMMUNITY)

## 2024-02-29 DIAGNOSIS — M868X7 Other osteomyelitis, ankle and foot: Secondary | ICD-10-CM | POA: Diagnosis not present

## 2024-02-29 DIAGNOSIS — I7090 Unspecified atherosclerosis: Secondary | ICD-10-CM

## 2024-02-29 DIAGNOSIS — M869 Osteomyelitis, unspecified: Secondary | ICD-10-CM | POA: Diagnosis not present

## 2024-02-29 LAB — BASIC METABOLIC PANEL WITH GFR
Anion gap: 15 (ref 5–15)
BUN: 16 mg/dL (ref 6–20)
CO2: 18 mmol/L — ABNORMAL LOW (ref 22–32)
Calcium: 8.9 mg/dL (ref 8.9–10.3)
Chloride: 106 mmol/L (ref 98–111)
Creatinine, Ser: 1.4 mg/dL — ABNORMAL HIGH (ref 0.44–1.00)
GFR, Estimated: 44 mL/min — ABNORMAL LOW (ref >60)
Glucose, Bld: 245 mg/dL — ABNORMAL HIGH (ref 70–99)
Potassium: 4.5 mmol/L (ref 3.5–5.1)
Sodium: 139 mmol/L (ref 135–145)

## 2024-02-29 LAB — GLUCOSE, CAPILLARY
Glucose-Capillary: 155 mg/dL — ABNORMAL HIGH (ref 70–99)
Glucose-Capillary: 203 mg/dL — ABNORMAL HIGH (ref 70–99)
Glucose-Capillary: 222 mg/dL — ABNORMAL HIGH (ref 70–99)
Glucose-Capillary: 241 mg/dL — ABNORMAL HIGH (ref 70–99)
Glucose-Capillary: 286 mg/dL — ABNORMAL HIGH (ref 70–99)

## 2024-02-29 LAB — CBC
HCT: 34.8 % — ABNORMAL LOW (ref 36.0–46.0)
Hemoglobin: 11.2 g/dL — ABNORMAL LOW (ref 12.0–15.0)
MCH: 27.9 pg (ref 26.0–34.0)
MCHC: 32.2 g/dL (ref 30.0–36.0)
MCV: 86.6 fL (ref 80.0–100.0)
Platelets: 228 K/uL (ref 150–400)
RBC: 4.02 MIL/uL (ref 3.87–5.11)
RDW: 14.6 % (ref 11.5–15.5)
WBC: 4.4 K/uL (ref 4.0–10.5)
nRBC: 0 % (ref 0.0–0.2)

## 2024-02-29 LAB — CREATININE, SERUM
Creatinine, Ser: 1.41 mg/dL — ABNORMAL HIGH (ref 0.44–1.00)
GFR, Estimated: 44 mL/min — ABNORMAL LOW (ref >60)

## 2024-02-29 MED ORDER — FAMOTIDINE 20 MG PO TABS: 20.0000 mg | ORAL_TABLET | Freq: Every day | ORAL | Status: DC

## 2024-02-29 MED ORDER — INSULIN ASPART 100 UNIT/ML IJ SOLN
0.0000 [IU] | Freq: Every day | INTRAMUSCULAR | Status: DC
  Administered 2024-02-29 – 2024-03-01 (×2): 2 [IU] via SUBCUTANEOUS

## 2024-02-29 MED ORDER — POLYETHYLENE GLYCOL 3350 17 G PO PACK
17.0000 g | PACK | Freq: Two times a day (BID) | ORAL | Status: DC
  Filled 2024-02-29 (×7): qty 1

## 2024-02-29 MED ORDER — METOPROLOL TARTRATE 12.5 MG HALF TABLET
12.5000 mg | ORAL_TABLET | Freq: Two times a day (BID) | ORAL | Status: DC
  Administered 2024-02-29 – 2024-03-04 (×8): 12.5 mg via ORAL
  Filled 2024-02-29 (×9): qty 1

## 2024-02-29 MED ORDER — VANCOMYCIN HCL 1250 MG/250ML IV SOLN
1250.0000 mg | INTRAVENOUS | Status: DC
  Administered 2024-02-29 – 2024-03-02 (×2): 1250 mg via INTRAVENOUS
  Filled 2024-02-29 (×2): qty 250

## 2024-02-29 MED ORDER — ACETAMINOPHEN 325 MG PO TABS
975.0000 mg | ORAL_TABLET | Freq: Four times a day (QID) | ORAL | Status: DC | PRN
  Administered 2024-03-02 – 2024-03-03 (×4): 975 mg via ORAL
  Filled 2024-02-29 (×4): qty 3

## 2024-02-29 MED ORDER — OXYCODONE-ACETAMINOPHEN 10-325 MG PO TABS: 1.0000 | ORAL_TABLET | ORAL | Status: DC | PRN

## 2024-02-29 MED ORDER — ALPRAZOLAM 0.5 MG PO TABS
0.5000 mg | ORAL_TABLET | Freq: Two times a day (BID) | ORAL | Status: DC | PRN
  Administered 2024-02-29: 0.5 mg via ORAL
  Filled 2024-02-29: qty 1

## 2024-02-29 MED ORDER — OXYCODONE HCL 5 MG PO TABS: 5.0000 mg | ORAL_TABLET | ORAL | Status: DC | PRN

## 2024-02-29 MED ORDER — SODIUM ZIRCONIUM CYCLOSILICATE 10 G PO PACK
10.0000 g | PACK | Freq: Once | ORAL | Status: AC
  Administered 2024-02-29: 10 g via ORAL
  Filled 2024-02-29: qty 1

## 2024-02-29 MED ORDER — SODIUM CHLORIDE 0.9 % IV SOLN
2.0000 g | Freq: Two times a day (BID) | INTRAVENOUS | Status: DC
  Administered 2024-02-29 – 2024-03-02 (×5): 2 g via INTRAVENOUS
  Filled 2024-02-29 (×5): qty 12.5

## 2024-02-29 MED ORDER — ROSUVASTATIN CALCIUM 20 MG PO TABS
20.0000 mg | ORAL_TABLET | Freq: Every day | ORAL | Status: DC
  Administered 2024-02-29 – 2024-03-04 (×5): 20 mg via ORAL
  Filled 2024-02-29 (×5): qty 1

## 2024-02-29 MED ORDER — TRAMADOL HCL 50 MG PO TABS
50.0000 mg | ORAL_TABLET | Freq: Four times a day (QID) | ORAL | Status: DC | PRN
  Administered 2024-02-29 – 2024-03-04 (×8): 50 mg via ORAL
  Filled 2024-02-29 (×8): qty 1

## 2024-02-29 MED ORDER — LOSARTAN POTASSIUM 50 MG PO TABS
25.0000 mg | ORAL_TABLET | Freq: Every day | ORAL | Status: DC
Start: 2024-02-29 — End: 2024-03-04
  Administered 2024-02-29 – 2024-03-04 (×5): 25 mg via ORAL
  Filled 2024-02-29 (×5): qty 1

## 2024-02-29 MED ORDER — INSULIN GLARGINE 100 UNIT/ML ~~LOC~~ SOLN
9.0000 [IU] | Freq: Every day | SUBCUTANEOUS | Status: DC
  Administered 2024-02-29 – 2024-03-01 (×2): 9 [IU] via SUBCUTANEOUS
  Filled 2024-02-29 (×2): qty 0.09

## 2024-02-29 MED ORDER — PREGABALIN 75 MG PO CAPS
150.0000 mg | ORAL_CAPSULE | Freq: Every day | ORAL | Status: DC
Start: 2024-02-29 — End: 2024-03-04
  Administered 2024-02-29 – 2024-03-04 (×5): 150 mg via ORAL
  Filled 2024-02-29 (×5): qty 2

## 2024-02-29 MED ORDER — ASPIRIN 81 MG PO TBEC
81.0000 mg | DELAYED_RELEASE_TABLET | Freq: Every day | ORAL | Status: DC
  Administered 2024-02-29 – 2024-03-04 (×5): 81 mg via ORAL
  Filled 2024-02-29 (×5): qty 1

## 2024-02-29 MED ORDER — INSULIN ASPART 100 UNIT/ML IJ SOLN
0.0000 [IU] | Freq: Three times a day (TID) | INTRAMUSCULAR | Status: DC
  Administered 2024-02-29: 5 [IU] via SUBCUTANEOUS
  Administered 2024-02-29 (×2): 3 [IU] via SUBCUTANEOUS
  Administered 2024-03-01 (×2): 5 [IU] via SUBCUTANEOUS
  Administered 2024-03-01: 1 [IU] via SUBCUTANEOUS
  Administered 2024-03-02: 3 [IU] via SUBCUTANEOUS
  Administered 2024-03-02: 1 [IU] via SUBCUTANEOUS
  Administered 2024-03-02 – 2024-03-03 (×3): 5 [IU] via SUBCUTANEOUS
  Administered 2024-03-03: 3 [IU] via SUBCUTANEOUS
  Administered 2024-03-04: 5 [IU] via SUBCUTANEOUS
  Administered 2024-03-04: 3 [IU] via SUBCUTANEOUS
  Filled 2024-02-29: qty 1

## 2024-02-29 MED ORDER — PANTOPRAZOLE SODIUM 40 MG PO TBEC
40.0000 mg | DELAYED_RELEASE_TABLET | Freq: Every day | ORAL | Status: DC
  Administered 2024-02-29 – 2024-03-04 (×5): 40 mg via ORAL
  Filled 2024-02-29 (×5): qty 1

## 2024-02-29 MED ORDER — OXYCODONE-ACETAMINOPHEN 5-325 MG PO TABS
1.0000 | ORAL_TABLET | ORAL | Status: DC | PRN
  Filled 2024-02-29: qty 1

## 2024-02-29 NOTE — Progress Notes (Signed)
 PROGRESS NOTE [name]  FMW:[id number] DOB: [birth year] DOA: 02/28/2024 PCP: Delbert Clam, MD  Brief Narrative/Hospital Course: [name] is a 58 y.o. year old female with PMH of  type 1 diabetes, anxiety and depression, CAD S/P CABG, diabetic neuropathy, hypertension, GERD, hyperlipidemia who ishaving left foot great toe infection for the last 3 weeks and followed up PCP, who started antibiotic therapy however despite compliance patient noted worsening with color change of the big toe and therefore came in for further management. In ED: Vitals stable afebrile  X-ray of the left foot showed finding of erosions and bony destruction involving the proximal phalanx of the great toe as well as in the distal phalanx of the great toe suspicious for active osteomyelitis.  Podiatry was consulted placed on antibiotics and admitted  Subjective: Seen and examined today Left foot pain some- better with meds- do not like oxy. Just saw podiatry this am Overnight afebrile BP stable labs with creatinine stable at 1.4 blood sugar 240s  Assessment and plan:  Acute osteomyelitis of the left great toe Left foot infection Failed outpatient oral antibiotic therapy: X-ray finding concerning for osteomyelitis, continue vancomycin  and cefepime  Discussed w/ podiatry and pla for amputation  ABI-reviewed from 04/13/2022 with normal left and right  Planning for mri foot and ABI.  CKD stage III 3a Hyperkalemia-resolved Metabolic acidosis due to CKD: Renal function at baseline, monitor renal function electrolytes and bicarb, add p.o. bicarb. Recent Labs    06/21/23 1153 06/21/23 1939 06/21/23 2124 06/22/23 0033 06/22/23 0552 06/22/23 0856 06/22/23 1701 06/23/23 0628 02/28/24 2010 02/28/24 2017 02/29/24 0705 02/29/24 0706  BUN 18 19 21* 20 17 19 19  24* 18 24*  --  16  CREATININE 1.50* 1.69* 1.61* 1.48* 1.34* 1.41* 1.47* 1.53* 1.48* 1.40* 1.41* 1.40*  CO2 19* 17* 15* 18* 21* 23 20* 22 17*  --    --  18*  K 4.5 4.5 4.2 3.8 3.3* 3.8 4.1 4.4 4.8 5.2*  --  4.5     Type I diabetes with uncontrolled hyperglycemia and neuropathy PTA on insulin  pump, Lantus  9 units twice daily.last A1c 10.28 January 2024.  Continue SSI, Lantus  9 units daily.  Continue Neurontin   Coronary artery disease status post CABG 6 yrs ago Hypertension Hhyperlipidemia: BP stable continue losartan , statin, metoprolol  and aspirin  81.  Home Lasix  on hold   GERD Continue PPI   Class I Obesity w/ Body mass index is 34.57 kg/m.: Will benefit with PCP follow-up, weight loss,healthy lifestyle and outpatient sleep eval if not done- never had osa eval..  DVT prophylaxis: enoxaparin  (LOVENOX ) injection 40 mg Start: 02/29/24 1800 Code Status:   Code Status: Full Code Family Communication: plan of care discussed with patient at bedside. Patient status is: Remains hospitalized because of severity of illness Level of care: Telemetry Medical   Dispo: The patient is from: home, lives alone            Anticipated disposition: TBD Objective: Vitals last 24 hrs: Vitals:   02/28/24 2348 02/29/24 0030 02/29/24 0400 02/29/24 0741  BP:  134/61 132/72 131/70  Pulse:  76 72 73  Resp:  14 16 18   Temp: 98.1 F (36.7 C) 99.2 F (37.3 C) 99.1 F (37.3 C) 99 F (37.2 C)  TempSrc: Oral Oral Oral Oral  SpO2:  100% 100% 99%  Weight:      Height:        Physical Examination: General exam: alert awake, oriented, older than stated age HEENT:Oral mucosa moist, Ear/Nose WNL  grossly Respiratory system: Bilaterally clear BS,no use of accessory muscle Cardiovascular system: S1 & S2 +, No JVD. Gastrointestinal system: Abdomen soft,NT,ND, BS+ Nervous System: Alert, awake, moving all extremities,and following commands. Extremities: LE edema neg, distal extremities warm.  Left foot wound covered Skin: No rashes,no icterus. MSK: Normal muscle bulk,tone, power   Medications reviewed:  Scheduled Meds:  aspirin  EC  81 mg Oral Daily    enoxaparin  (LOVENOX ) injection  40 mg Subcutaneous Q24H   insulin  aspart  0-5 Units Subcutaneous QHS   insulin  aspart  0-9 Units Subcutaneous TID WC   insulin  glargine  9 Units Subcutaneous Daily   losartan   25 mg Oral Daily   metoprolol  tartrate  12.5 mg Oral BID   pantoprazole   40 mg Oral Daily   polyethylene glycol  17 g Oral BID   pregabalin   150 mg Oral Daily   rosuvastatin   20 mg Oral Daily   Continuous Infusions:  ceFEPime  (MAXIPIME ) IV 2 g (02/29/24 0534)   vancomycin  166.7 mL/hr at 02/29/24 0314   Diet: Diet Order             Diet NPO time specified  Diet effective now           Diet heart healthy/carb modified Fluid consistency: Thin  Diet effective now                    Data Reviewed: I have personally reviewed following labs and imaging studies ( see epic result tab) CBC: Recent Labs  Lab 02/28/24 2010 02/28/24 2017 02/29/24 0705  WBC 5.9  --  4.4  HGB 13.0 13.3 11.2*  HCT 38.7 39.0 34.8*  MCV 83.2  --  86.6  PLT 237  --  228   CMP: Recent Labs  Lab 02/28/24 2010 02/28/24 2017 02/29/24 0705 02/29/24 0706  NA 137 136  --  139  K 4.8 5.2*  --  4.5  CL 104 109  --  106  CO2 17*  --   --  18*  GLUCOSE 153* 159*  --  245*  BUN 18 24*  --  16  CREATININE 1.48* 1.40* 1.41* 1.40*  CALCIUM  9.5  --   --  8.9   GFR: Estimated Creatinine Clearance: 43.8 mL/min (A) (by C-G formula based on SCr of 1.4 mg/dL (H)). Recent Labs  Lab 02/28/24 2010  AST 26  ALT 16  ALKPHOS 106  BILITOT 0.9  PROT 7.8  ALBUMIN  3.5   No results for input(s): LIPASE, AMYLASE in the last 168 hours. No results for input(s): AMMONIA in the last 168 hours. Coagulation Profile: No results for input(s): INR, PROTIME in the last 168 hours. Unresulted Labs (From admission, onward)     Start     Ordered   03/06/24 0500  Creatinine, serum  (enoxaparin  (LOVENOX )    CrCl >/= 30 ml/min)  Weekly,   R     Comments: while on enoxaparin  therapy    02/28/24 2333   03/01/24  0500  Basic metabolic panel with GFR  Daily,   R      02/29/24 1013   03/01/24 0500  CBC  Daily,   R      02/29/24 1013   02/28/24 1730  Urinalysis, Routine w reflex microscopic -Urine, Clean Catch  Once,   URGENT       Question:  Specimen Source  Answer:  Urine, Clean Catch   02/28/24 1729  Antimicrobials/Microbiology: Anti-infectives (From admission, onward)    Start     Dose/Rate Route Frequency Ordered Stop   02/29/24 0600  ceFEPIme  (MAXIPIME ) 2 g in sodium chloride  0.9 % 100 mL IVPB        2 g 200 mL/hr over 30 Minutes Intravenous Every 12 hours 02/29/24 0237     02/29/24 0200  vancomycin  (VANCOREADY) IVPB 1250 mg/250 mL        1,250 mg 166.7 mL/hr over 90 Minutes Intravenous Every 48 hours 02/29/24 0102     02/28/24 2145  vancomycin  (VANCOCIN ) IVPB 1000 mg/200 mL premix  Status:  Discontinued        1,000 mg 200 mL/hr over 60 Minutes Intravenous  Once 02/28/24 2143 02/29/24 0102   02/28/24 2145  cefTRIAXone  (ROCEPHIN ) 2 g in sodium chloride  0.9 % 100 mL IVPB        2 g 200 mL/hr over 30 Minutes Intravenous  Once 02/28/24 2143 02/28/24 2351         Component Value Date/Time   SDES BLOOD RIGHT ARM 02/28/2024 2233   SPECREQUEST  02/28/2024 2233    BOTTLES DRAWN AEROBIC AND ANAEROBIC Blood Culture results may not be optimal due to an inadequate volume of blood received in culture bottles   CULT  02/28/2024 2233    NO GROWTH < 12 HOURS Performed at [facility], [address], [place], [place] [address]    REPTSTATUS PENDING 02/28/2024 2233    Procedures: Mennie LAMY, MD [facility] 02/29/2024, 10:14 AM

## 2024-02-29 NOTE — Progress Notes (Signed)
 RN received report and care from [facility]. Pt is alert and oriented, Pt is anxious and reports pain in Toe. This RN contacted A Andrez to make aware.

## 2024-02-29 NOTE — Plan of Care (Signed)
  Problem: Activity: Goal: Risk for activity intolerance will decrease Outcome: Progressing   Problem: Nutrition: Goal: Adequate nutrition will be maintained Outcome: Progressing   Problem: Coping: Goal: Level of anxiety will decrease Outcome: Progressing   Problem: Pain Managment: Goal: General experience of comfort will improve and/or be controlled Outcome: Progressing

## 2024-02-29 NOTE — Hospital Course (Addendum)
 [name] is a 58 y.o. year old female with PMH of  type 1 diabetes, anxiety and depression, CAD S/P CABG, diabetic neuropathy, hypertension, GERD, hyperlipidemia who ishaving left foot great toe infection for the last 3 weeks and followed up PCP, who started antibiotic therapy however despite compliance patient noted worsening with color change of the big toe and therefore came in for further management. In ED: Vitals stable afebrile  X-ray of the left foot showed finding of erosions and bony destruction involving the proximal phalanx of the great toe as well as in the distal phalanx of the great toe suspicious for active osteomyelitis.  Podiatry was consulted placed on antibiotics and admitted. MRI showed >osteomyelitis and septic arthritis of the interphalangeal joint of the great toe, Great toe soft tissue wound without evidence of abscess or soft tissue emphysema. Diffuse forefoot muscular atrophy with mild nonspecific T2 hyperintensity within the flexor musculature. ABI bilateral normal S/P left great toe amputation at MPJ level 03/02/24 Postop doing well. PT OT eval for disposition  Subjective: Seen and examined Pain relatedly stable.  Waiting for podiatry Overnight afebrile BP stable on room air CBG this morning 140s-290s Hesitant to go home today  Assessment and Plan:  Acute osteomyelitis of the left great toe Left foot infection Failed outpatient oral antibiotic therapy: X-ray finding concerning for osteomyelitis,MRI showed >osteomyelitis and septic arthritis of the interphalangeal joint of the great toe, Great toe soft tissue wound without evidence of abscess or soft tissue emphysema. Diffuse forefoot muscular atrophy with mild nonspecific T2 hyperintensity within the flexor musculature. ABI bilateral normal vancomycin  and cefepime  changed to [place] post op 03/02/24. S/P left great toe amputation at MPJ level 03/02/24 Continue multimodal pain management Discussed with podiatry after  PT eval plan for discharge if cleared.  Post op care >Expect clean margin, recommending 5 days clindamycin  ( pcn allergy  listed) on discharge. Leave dressing C/D/I follow up next week Tuesday. WBAT in a post op shoe to the operative limb until further instructed. Dressing is to remain clean, dry, and intact.  PT OT eval  CKD stage III 3a Hyperkalemia-resolved Metabolic acidosis due to CKD: Renal function at baseline, continue to monitor lites, bicarb level has been fluctuating. Continue oral bicarb, monitor renal function intermittently. Recent Labs    06/21/23 2124 06/22/23 0033 06/22/23 0552 06/22/23 0856 06/22/23 1701 06/23/23 0628 02/28/24 2010 02/28/24 2017 02/29/24 0705 02/29/24 0706 03/01/24 0459 03/02/24 0711  BUN 21* 20 17 19 19  24* 18 24*  --  16 23* 26*  CREATININE 1.61* 1.48* 1.34* 1.41* 1.47* 1.53* 1.48* 1.40* 1.41* 1.40* 1.43* 1.56*  CO2 15* 18* 21* 23 20* 22 17*  --   --  18* 21* 16*  K 4.2 3.8 3.3* 3.8 4.1 4.4 4.8 5.2*  --  4.5 4.5 4.7     TDM w/ uncontrolled hyperglycemia and neuropathy Diabetic gastroparesis on Reglan  PTA PTA on insulin  pump, Lantus  9 units twice daily.last A1c 10.28 January 2024.  Continue her current Lantus -with increased to 18 units and NovoLog  4 units 3 times daily Premeal. Cont SSI,neurontin , and Reglan  and PRN antiemetics.  Recent Labs  Lab 03/02/24 1638 03/02/24 1732 03/02/24 2113 03/03/24 0653 03/03/24 1113  GLUCAP 135* 140* 195* 296* 272*    CAD S/P CABG 6 yrs ago Hypertension Hhyperlipidemia: BP stable continue losartan , statin, metoprolol  and aspirin  81. Home Lasix  on hold   GERD Continue PPI,   Class I Obesity w/ Body mass index is 34.57 kg/m.: Will benefit with PCP follow-up,  weight loss,healthy lifestyle and outpatient sleep eval if not done- never had osa eval..  DVT prophylaxis: enoxaparin  (LOVENOX ) injection 40 mg Start: 02/29/24 1800 Code Status:   Code Status: Full Code Family Communication: plan of care  discussed with patient at bedside. Patient status is: Remains hospitalized because of severity of illness Level of care: Telemetry Medical   Dispo: The patient is from: home, lives alone            Anticipated disposition: TBD pending podiatry clearance Objective: Vitals last 24 hrs: Vitals:   03/02/24 1553 03/02/24 2211 03/03/24 0431 03/03/24 0822  BP: (!) 145/79 (!) 131/53 (!) 142/71 (!) 138/55  Pulse: (!) 57 (!) 58 65 68  Resp: 18 16 16 16   Temp: 98.1 F (36.7 C) 98 F (36.7 C) 97.6 F (36.4 C) 98.2 F (36.8 C)  TempSrc:  Oral Oral Oral  SpO2: 98% 99% 98% 94%  Weight:      Height:        Physical Examination: General exam: Alert awake  HEENT:Oral mucosa moist, Ear/Nose WNL grossly Respiratory system: Bilaterally clear BS,no use of accessory muscle Cardiovascular system: S1 & S2 +, No JVD. Gastrointestinal system: Abdomen soft,NT,ND, BS+ Nervous System: Alert, awake, moving all extremities,and following commands. Extremities: distal extremities warm.  Left foot with dressing in place postop  Skin: No rashes,no icterus. MSK: Normal muscle bulk,tone, power   Medications reviewed:  Scheduled Meds:  aspirin  EC  81 mg Oral Daily   clindamycin   300 mg Oral Q8H   enoxaparin  (LOVENOX ) injection  40 mg Subcutaneous Q24H   insulin  aspart  0-5 Units Subcutaneous QHS   insulin  aspart  0-9 Units Subcutaneous TID WC   insulin  aspart  5 Units Subcutaneous TID WC   insulin  glargine  18 Units Subcutaneous Daily   losartan   25 mg Oral Daily   metoCLOPramide  (REGLAN ) injection  5 mg Intravenous Q12H   metoprolol  tartrate  12.5 mg Oral BID   pantoprazole   40 mg Oral Daily   polyethylene glycol  17 g Oral BID   pregabalin   150 mg Oral Daily   rosuvastatin   20 mg Oral Daily   sodium bicarbonate   1,300 mg Oral BID   Continuous Infusions:  promethazine  (PHENERGAN ) injection (IM or IVPB) 6.25 mg (03/01/24 0844)   Diet: Diet Order             Diet Carb Modified           Diet  heart healthy/carb modified Fluid consistency: Thin  Diet effective now

## 2024-02-29 NOTE — Progress Notes (Signed)
 Pharmacy Antibiotic Note  [name] is a 58 y.o. female admitted on 02/28/2024 with L toe wound failed clindamycin  outpatient.  Pharmacy has been consulted for vancomycin  and cefepime  dosing.   9/6 Vancomycin  1250mg  Q 48 hr with Est AUC: 463 Scr used: 1.4 mg/dL; Vd coeff: 0.5 L/kg  Plan: Vancomycin  1250mg  q48hr Ceftriaxone  2g q24h Monitor cultures, clinical status, renal function, vancomycin  level Narrow abx as able and f/u duration    Height: 5' 1 (154.9 cm) Weight: 83 kg (182 lb 15.7 oz) IBW/kg (Calculated) : 47.8  Temp (24hrs), Avg:98.3 F (36.8 C), Min:97.7 F (36.5 C), Max:99.2 F (37.3 C)  Recent Labs  Lab 02/28/24 2010 02/28/24 2017 02/28/24 2327  WBC 5.9  --   --   CREATININE 1.48* 1.40*  --   LATICACIDVEN  --  1.4 1.0    Estimated Creatinine Clearance: 43.8 mL/min (A) (by C-G formula based on SCr of 1.4 mg/dL (H)).    Allergies  Allergen Reactions   Anesthetics, Amide Nausea And Vomiting   Chlorhexidine  Other (See Comments)    Unknown reaction   Penicillins Diarrhea, Nausea And Vomiting and Other (See Comments)   Enkaid [Encainide] Nausea And Vomiting   Reglan  [Metoclopramide ] Swelling and Other (See Comments)    Dystonia Muscle rigidity Slurred speech Tongue swelling   Subutex [Buprenorphine] Rash    Antimicrobials this admission: Vanc 9/6 >>  CTX 9/5 Cefepime  9/6  >>   Dose adjustments this admission: N/a  Microbiology results: 9/6 BCx:     Thank you for allowing pharmacy to be a part of this patient's care.  Jinnie Door, PharmD, BCPS, BCCP Clinical Pharmacist  Please check AMION for all [facility] Pharmacy phone numbers After 10:00 PM, call Main Pharmacy [phone]

## 2024-02-29 NOTE — Consult Note (Signed)
 Reason for Consult:Left hallux osteomyelitis Referring Physician: Dr. Mennie Lamy, MD  [name] is an 58 y.o. female.  HPI:  58 y.o. female with medical history significant of type 1 diabetes, anxiety and depression, coronary artery disease status post CABG, diabetic neuropathy, hypertension, GERD, hyperlipidemia.  Patient states that she has had a wound on her left big toe for about 1 month.  She was put on clindamycin  by Dr. Franky Shove, DPM however the wound continued to get worse and she was brought to the emergency room yesterday.  X-rays were obtained which revealed osteomyelitis.  Podiatry is consulted.  She currently denies any fevers or chills.  No significant pain but she states that she has neuropathy.  Past Medical History:  Diagnosis Date   AKI (acute kidney injury) (HCC)    Anemia, iron  deficiency    Anxiety and depression 05/18/2015   CAD (coronary artery disease), native coronary artery 01/11/2018   s/p CABG - Non-STEMI with severe three-vessel disease noted July 2019   Depression    Diabetic gastroparesis (HCC)    Diabetic neuropathy, type I diabetes mellitus (HCC) 05/18/2015   DKA, type 1 (HCC) 03/18/2016   Essential hypertension    Gastroparesis    GERD (gastroesophageal reflux disease)    HLD (hyperlipidemia)    MDD (major depressive disorder), single episode, severe , no psychosis (HCC)    Tardive dyskinesia    Vitamin B12 deficiency 08/16/2015    Past Surgical History:  Procedure Laterality Date   CARDIAC SURGERY     COLONOSCOPY  09/27/2014   at [facility]   CORONARY ARTERY BYPASS GRAFT N/A 01/14/2018   Procedure: CORONARY ARTERY BYPASS GRAFTING (CABG)X3, RIGHT AND LEFT SAPHENOUS VEIN HARVEST, MAMMARY TAKE DOWN. MAMMARY TO LAD, SVG TO PD, SVG TO DISTAL CIRC.;  Surgeon: Army Dallas NOVAK, MD;  Location: [facility] OR;  Service: Open Heart Surgery;  Laterality: N/A;   ESOPHAGOGASTRODUODENOSCOPY  09/27/2014   at [facility], Dr Gretel Crigler. biospy neg for celiac, neg for H  pylori.    EYE SURGERY     gailstones     INTRAMEDULLARY (IM) NAIL INTERTROCHANTERIC Right 05/10/2019   Procedure: INTRAMEDULLARY (IM) NAIL INTERTROCHANTRIC;  Surgeon: Leora Lynwood SAUNDERS, MD;  Location: [facility] ORS;  Service: Orthopedics;  Laterality: Right;   IR FLUORO GUIDE CV LINE RIGHT  02/01/2017   IR FLUORO GUIDE CV LINE RIGHT  03/06/2017   IR FLUORO GUIDE CV LINE RIGHT  03/25/2017   IR GENERIC HISTORICAL  01/24/2016   IR FLUORO GUIDE CV LINE RIGHT 01/24/2016 Darrell K Allred, PA-C [facility]   IR GENERIC HISTORICAL  01/24/2016   IR US  GUIDE VASC ACCESS RIGHT 01/24/2016 Darrell K Allred, PA-C [facility]   IR US  GUIDE VASC ACCESS RIGHT  02/01/2017   IR US  GUIDE VASC ACCESS RIGHT  03/06/2017   IR US  GUIDE VASC ACCESS RIGHT  03/25/2017   IR US  GUIDE VASC ACCESS RIGHT  01/20/2019   IR VENIPUNCTURE 87YRS/OLDER BY MD  01/20/2019   LEFT HEART CATH AND CORONARY ANGIOGRAPHY N/A 01/07/2018   Procedure: LEFT HEART CATH AND CORONARY ANGIOGRAPHY;  Surgeon: Burnard Ned LABOR, MD;  Location: [facility] INVASIVE CV LAB;  Service: Cardiovascular;  Laterality: N/A;   POSTERIOR VITRECTOMY AND MEMBRANE PEEL-LEFT EYE  09/28/2002   POSTERIOR VITRECTOMY AND MEMBRANE PEEL-RIGHT EYE  03/16/2002   RETINAL DETACHMENT SURGERY     TEE WITHOUT CARDIOVERSION N/A 01/14/2018   Procedure: TRANSESOPHAGEAL ECHOCARDIOGRAM (TEE);  Surgeon: Army Dallas NOVAK, MD;  Location: [facility] OR;  Service: Open  Heart Surgery;  Laterality: N/A;    Family History  Problem Relation Age of Onset   Cystic fibrosis Mother    Hypertension Father    Diabetes Brother    Hypertension Maternal Grandmother     Social History:  reports that she has never smoked. She has never used smokeless tobacco. She reports that she does not currently use drugs after having used the following drugs: Marijuana. She reports that she does not drink alcohol .  Allergies:  Allergies  Allergen Reactions   Anesthetics, Amide Nausea And Vomiting   Chlorhexidine  Other (See Comments)     Unknown reaction   Penicillins Diarrhea, Nausea And Vomiting and Other (See Comments)   Enkaid [Encainide] Nausea And Vomiting   Reglan  [Metoclopramide ] Swelling and Other (See Comments)    Dystonia Muscle rigidity Slurred speech Tongue swelling   Subutex [Buprenorphine] Rash    Medications: I have reviewed the patient's current medications.  Results for orders placed or performed during the hospital encounter of 02/28/24 (from the past 48 hours)  Comprehensive metabolic panel     Status: Abnormal   Collection Time: 02/28/24  8:10 PM  Result Value Ref Range   Sodium 137 135 - 145 mmol/L   Potassium 4.8 3.5 - 5.1 mmol/L   Chloride 104 98 - 111 mmol/L   CO2 17 (L) 22 - 32 mmol/L   Glucose, Bld 153 (H) 70 - 99 mg/dL    Comment: Glucose reference range applies only to samples taken after fasting for at least 8 hours.   BUN 18 6 - 20 mg/dL   Creatinine, Ser 8.51 (H) 0.44 - 1.00 mg/dL   Calcium  9.5 8.9 - 10.3 mg/dL   Total Protein 7.8 6.5 - 8.1 g/dL   Albumin  3.5 3.5 - 5.0 g/dL   AST 26 15 - 41 U/L   ALT 16 0 - 44 U/L   Alkaline Phosphatase 106 38 - 126 U/L   Total Bilirubin 0.9 0.0 - 1.2 mg/dL   GFR, Estimated 41 (L) >60 mL/min    Comment: (NOTE) Calculated using the CKD-EPI Creatinine Equation (2021)    Anion gap 16 (H) 5 - 15    Comment: Performed at [facility], [address], [place], [place] [address]  CBC     Status: None   Collection Time: 02/28/24  8:10 PM  Result Value Ref Range   WBC 5.9 4.0 - 10.5 K/uL   RBC 4.65 3.87 - 5.11 MIL/uL   Hemoglobin 13.0 12.0 - 15.0 g/dL   HCT 61.2 63.9 - 53.9 %   MCV 83.2 80.0 - 100.0 fL   MCH 28.0 26.0 - 34.0 pg   MCHC 33.6 30.0 - 36.0 g/dL   RDW 85.1 88.4 - 84.4 %   Platelets 237 150 - 400 K/uL   nRBC 0.0 0.0 - 0.2 %    Comment: Performed at [facility], [address] [place], [place] [address]  I-stat chem 8, ED (not at [facility], [facility] or [facility])     Status: Abnormal   Collection Time: 02/28/24  8:17 PM  Result Value  Ref Range   Sodium 136 135 - 145 mmol/L   Potassium 5.2 (H) 3.5 - 5.1 mmol/L   Chloride 109 98 - 111 mmol/L   BUN 24 (H) 6 - 20 mg/dL   Creatinine, Ser 8.59 (H) 0.44 - 1.00 mg/dL   Glucose, Bld 840 (H) 70 - 99 mg/dL    Comment: Glucose reference range applies only to samples  taken after fasting for at least 8 hours.   Calcium , Ion 1.07 (L) 1.15 - 1.40 mmol/L   TCO2 21 (L) 22 - 32 mmol/L   Hemoglobin 13.3 12.0 - 15.0 g/dL   HCT 60.9 63.9 - 53.9 %  I-Stat CG4 Lactic Acid     Status: None   Collection Time: 02/28/24  8:17 PM  Result Value Ref Range   Lactic Acid, Venous 1.4 0.5 - 1.9 mmol/L  I-Stat CG4 Lactic Acid     Status: None   Collection Time: 02/28/24 11:27 PM  Result Value Ref Range   Lactic Acid, Venous 1.0 0.5 - 1.9 mmol/L  Glucose, capillary     Status: Abnormal   Collection Time: 02/29/24 12:32 AM  Result Value Ref Range   Glucose-Capillary 155 (H) 70 - 99 mg/dL    Comment: Glucose reference range applies only to samples taken after fasting for at least 8 hours.  CBC     Status: Abnormal   Collection Time: 02/29/24  7:05 AM  Result Value Ref Range   WBC 4.4 4.0 - 10.5 K/uL   RBC 4.02 3.87 - 5.11 MIL/uL   Hemoglobin 11.2 (L) 12.0 - 15.0 g/dL   HCT 65.1 (L) 63.9 - 53.9 %   MCV 86.6 80.0 - 100.0 fL   MCH 27.9 26.0 - 34.0 pg   MCHC 32.2 30.0 - 36.0 g/dL   RDW 85.3 88.4 - 84.4 %   Platelets 228 150 - 400 K/uL   nRBC 0.0 0.0 - 0.2 %    Comment: Performed at [facility], [address], [place], [place] [address]  Creatinine, serum     Status: Abnormal   Collection Time: 02/29/24  7:05 AM  Result Value Ref Range   Creatinine, Ser 1.41 (H) 0.44 - 1.00 mg/dL   GFR, Estimated 44 (L) >60 mL/min    Comment: (NOTE) Calculated using the CKD-EPI Creatinine Equation (2021) Performed at [facility], [address] [place], [place] [address]   Basic metabolic panel     Status: Abnormal   Collection Time: 02/29/24  7:06 AM  Result Value Ref Range   Sodium 139  135 - 145 mmol/L   Potassium 4.5 3.5 - 5.1 mmol/L   Chloride 106 98 - 111 mmol/L   CO2 18 (L) 22 - 32 mmol/L   Glucose, Bld 245 (H) 70 - 99 mg/dL    Comment: Glucose reference range applies only to samples taken after fasting for at least 8 hours.   BUN 16 6 - 20 mg/dL   Creatinine, Ser 8.59 (H) 0.44 - 1.00 mg/dL   Calcium  8.9 8.9 - 10.3 mg/dL   GFR, Estimated 44 (L) >60 mL/min    Comment: (NOTE) Calculated using the CKD-EPI Creatinine Equation (2021)    Anion gap 15 5 - 15    Comment: Performed at [facility], [address], [place], [place] [address]  Glucose, capillary     Status: Abnormal   Collection Time: 02/29/24  8:32 AM  Result Value Ref Range   Glucose-Capillary 286 (H) 70 - 99 mg/dL    Comment: Glucose reference range applies only to samples taken after fasting for at least 8 hours.   *Note: Due to a large number of results and/or encounters for the requested time period, some results have not been displayed. A complete set of results can be found in Results Review.    DG Foot Complete Left Result Date: 02/28/2024 CLINICAL DATA:  Swelling and ulceration of the great toe, left foot wound infection, diabetes EXAM: LEFT FOOT - COMPLETE 3+ VIEW COMPARISON:  07/08/2018 FINDINGS: Abnormal erosions and bony destructive findings in the head of the proximal phalanx great toe. Mild lucency/erosion along the proximal cortical margin of portions of the distal phalanx of the great toe. Appearance suspicious for active osteomyelitis/septic interphalangeal joint given the clinical context. Dorsal subcutaneous edema in the forefoot. IMPRESSION: 1. Abnormal erosions and bony destructive findings in the head of the proximal phalanx of the great toe and base of the distal phalanx great toe, suspicious for active osteomyelitis/septic interphalangeal joint given the clinical context. 2. Dorsal subcutaneous edema in the forefoot. Electronically Signed   By: Ryan Salvage M.D.   On:  02/28/2024 18:51    Review of Systems Blood pressure 131/70, pulse 73, temperature 99 F (37.2 C), temperature source Oral, resp. rate 18, height 5' 1 (1.549 m), weight 83 kg, SpO2 99%. Physical Exam General: AAO x3, NAD  Dermatological: Ulceration noted to the left hallux as pictured below there is edema and erythema present at the hallux.  There is no crepitation.  There is no ascending cellulitis at this time.  There is malodor from the wound.   Vascular: Dorsalis Pedis artery and Posterior Tibial artery pedal pulses are palpable but decreased on the left likely due to swelling. No There is no pain with calf compression, swelling, warmth, erythema.   Neruologic: Decreased  Musculoskeletal: No pain on exam.   Assessment/Plan: Left hallux osteomyelitis  -I reviewed the x-rays with the patient.  Given the extent of the infection we discussed amputation of the digit.  We had a long discussion regards to surgery versus conservative options.  At first she was very hesitant on surgery but she seems to be more amenable to it at this time.  She does not want to proceed with surgery over the weekend as she needs time to process this.  I send I will order an MRI to further evaluate the extent of the infection as well as arterial studies.  Continue broad-spectrum antibiotics.  Will place n.p.o. for tomorrow just in case of surgery otherwise we will plan on surgery Monday.  I will discuss with her the findings of the MRI tomorrow morning.  [name] 02/29/2024, 9:36 AM

## 2024-02-29 NOTE — Plan of Care (Signed)

## 2024-03-01 DIAGNOSIS — M868X7 Other osteomyelitis, ankle and foot: Secondary | ICD-10-CM | POA: Diagnosis not present

## 2024-03-01 DIAGNOSIS — M869 Osteomyelitis, unspecified: Secondary | ICD-10-CM | POA: Diagnosis not present

## 2024-03-01 LAB — BASIC METABOLIC PANEL WITH GFR
Anion gap: 9 (ref 5–15)
BUN: 23 mg/dL — ABNORMAL HIGH (ref 6–20)
CO2: 21 mmol/L — ABNORMAL LOW (ref 22–32)
Calcium: 9 mg/dL (ref 8.9–10.3)
Chloride: 106 mmol/L (ref 98–111)
Creatinine, Ser: 1.43 mg/dL — ABNORMAL HIGH (ref 0.44–1.00)
GFR, Estimated: 43 mL/min — ABNORMAL LOW (ref >60)
Glucose, Bld: 279 mg/dL — ABNORMAL HIGH (ref 70–99)
Potassium: 4.5 mmol/L (ref 3.5–5.1)
Sodium: 136 mmol/L (ref 135–145)

## 2024-03-01 LAB — CBC
HCT: 35.8 % — ABNORMAL LOW (ref 36.0–46.0)
Hemoglobin: 11.4 g/dL — ABNORMAL LOW (ref 12.0–15.0)
MCH: 27.9 pg (ref 26.0–34.0)
MCHC: 31.8 g/dL (ref 30.0–36.0)
MCV: 87.5 fL (ref 80.0–100.0)
Platelets: 211 K/uL (ref 150–400)
RBC: 4.09 MIL/uL (ref 3.87–5.11)
RDW: 14.8 % (ref 11.5–15.5)
WBC: 4.7 K/uL (ref 4.0–10.5)
nRBC: 0 % (ref 0.0–0.2)

## 2024-03-01 LAB — GLUCOSE, CAPILLARY
Glucose-Capillary: 315 mg/dL — ABNORMAL HIGH (ref 70–99)
Glucose-Capillary: 291 mg/dL — ABNORMAL HIGH (ref 70–99)
Glucose-Capillary: 288 mg/dL — ABNORMAL HIGH (ref 70–99)
Glucose-Capillary: 137 mg/dL — ABNORMAL HIGH (ref 70–99)
Glucose-Capillary: 215 mg/dL — ABNORMAL HIGH (ref 70–99)

## 2024-03-01 LAB — MAGNESIUM: Magnesium: 1.9 mg/dL (ref 1.7–2.4)

## 2024-03-01 LAB — VAS US ABI WITH/WO TBI
Left ABI: 1.01
Right ABI: 1.08

## 2024-03-01 MED ORDER — METOCLOPRAMIDE HCL 5 MG/ML IJ SOLN
5.0000 mg | Freq: Two times a day (BID) | INTRAMUSCULAR | Status: DC
  Administered 2024-03-01 (×2): 5 mg via INTRAVENOUS
  Filled 2024-03-01 (×6): qty 2

## 2024-03-01 MED ORDER — ONDANSETRON HCL 4 MG/2ML IJ SOLN: 4.0000 mg | Freq: Four times a day (QID) | INTRAMUSCULAR | Status: DC | PRN

## 2024-03-01 MED ORDER — ONDANSETRON HCL 4 MG/2ML IJ SOLN
4.0000 mg | Freq: Four times a day (QID) | INTRAMUSCULAR | Status: DC | PRN
  Administered 2024-03-01: 4 mg via INTRAVENOUS
  Filled 2024-03-01: qty 2

## 2024-03-01 MED ORDER — PROMETHAZINE (PHENERGAN) 6.25MG IN NS 50ML IVPB
6.2500 mg | Freq: Four times a day (QID) | INTRAVENOUS | Status: DC | PRN
  Administered 2024-03-01: 6.25 mg via INTRAVENOUS
  Filled 2024-03-01: qty 6.25

## 2024-03-01 MED ORDER — INSULIN GLARGINE 100 UNIT/ML ~~LOC~~ SOLN
14.0000 [IU] | Freq: Every day | SUBCUTANEOUS | Status: DC
  Administered 2024-03-02: 14 [IU] via SUBCUTANEOUS
  Filled 2024-03-01: qty 0.14

## 2024-03-01 NOTE — Progress Notes (Signed)
 PROGRESS NOTE [name]  FMW:[id number] DOB: [birth year] DOA: 02/28/2024 PCP: Delbert Clam, MD  Brief Narrative/Hospital Course: [name] is a 58 y.o. year old female with PMH of  type 1 diabetes, anxiety and depression, CAD S/P CABG, diabetic neuropathy, hypertension, GERD, hyperlipidemia who ishaving left foot great toe infection for the last 3 weeks and followed up PCP, who started antibiotic therapy however despite compliance patient noted worsening with color change of the big toe and therefore came in for further management. In ED: Vitals stable afebrile  X-ray of the left foot showed finding of erosions and bony destruction involving the proximal phalanx of the great toe as well as in the distal phalanx of the great toe suspicious for active osteomyelitis.  Podiatry was consulted placed on antibiotics and admitted  Subjective: Seen and examined today Plaints of ongoing nausea and states stress is precipitating her gastroparesis,requesting her home Reglan  Overnight afebrile BP stable labs with hyperglycemia renal function stable  Assessment and plan:  Acute osteomyelitis of the left great toe Left foot infection Failed outpatient oral antibiotic therapy: X-ray finding concerning for osteomyelitis, continue vancomycin  and cefepime  as per podiatry, MRI foot pending, ABI bilateral normal Anticipating amputation  CKD stage III 3a Hyperkalemia-resolved Metabolic acidosis due to CKD: Renal function at baseline, continue to monitor, add oral bicarb Recent Labs    06/21/23 1939 06/21/23 2124 06/22/23 0033 06/22/23 0552 06/22/23 0856 06/22/23 1701 06/23/23 0628 02/28/24 2010 02/28/24 2017 02/29/24 0705 02/29/24 0706 03/01/24 0459  BUN 19 21* 20 17 19 19  24* 18 24*  --  16 23*  CREATININE 1.69* 1.61* 1.48* 1.34* 1.41* 1.47* 1.53* 1.48* 1.40* 1.41* 1.40* 1.43*  CO2 17* 15* 18* 21* 23 20* 22 17*  --   --  18* 21*  K 4.5 4.2 3.8 3.3* 3.8 4.1 4.4 4.8 5.2*  --  4.5 4.5      Type I diabetes with uncontrolled hyperglycemia and neuropathy Diabetic gastroparesis on Reglan  PTA PTA on insulin  pump, Lantus  9 units twice daily.last A1c 10.28 January 2024.  Continue SSI, increase Lantus , continue Neurontin  Requesting Reglan  added twice daily, continue nausea meds. Recent Labs  Lab 02/29/24 1132 02/29/24 1733 02/29/24 1958 03/01/24 0648 03/01/24 0748  GLUCAP 241* 203* 222* 315* 291*    Coronary artery disease status post CABG 6 yrs ago Hypertension Hhyperlipidemia: BP stable continue losartan , statin, metoprolol  and aspirin  81.  Home Lasix  on hold   GERD Continue PPI,   Class I Obesity w/ Body mass index is 34.57 kg/m.: Will benefit with PCP follow-up, weight loss,healthy lifestyle and outpatient sleep eval if not done- never had osa eval..  DVT prophylaxis: enoxaparin  (LOVENOX ) injection 40 mg Start: 02/29/24 1800 Code Status:   Code Status: Full Code Family Communication: plan of care discussed with patient at bedside. Patient status is: Remains hospitalized because of severity of illness Level of care: Telemetry Medical   Dispo: The patient is from: home, lives alone            Anticipated disposition: TBD Objective: Vitals last 24 hrs: Vitals:   02/29/24 1610 02/29/24 1959 03/01/24 0451 03/01/24 0700  BP: 121/60 (!) 114/50 (!) 134/52 (!) (P) 148/73  Pulse: 64 70 60 (P) 69  Resp: 16 16 16  (P) 16  Temp: 98 F (36.7 C) 97.7 F (36.5 C) 97.7 F (36.5 C) (P) 97.8 F (36.6 C)  TempSrc: Oral Oral Oral (P) Oral  SpO2: 99% 98% 95% (P) 98%  Weight:      Height:  Physical Examination: General exam: alert awake, oriented, appears nauseous HEENT:Oral mucosa moist, Ear/Nose WNL grossly Respiratory system: Bilaterally clear BS,no use of accessory muscle Cardiovascular system: S1 & S2 +, No JVD. Gastrointestinal system: Abdomen soft,NT,ND, BS+ Nervous System: Alert, awake, moving all extremities,and following commands. Extremities: LE edema  neg, distal extremities warm.  Left foot wound + Skin: No rashes,no icterus. MSK: Normal muscle bulk,tone, power   Medications reviewed:  Scheduled Meds:  aspirin  EC  81 mg Oral Daily   enoxaparin  (LOVENOX ) injection  40 mg Subcutaneous Q24H   insulin  aspart  0-5 Units Subcutaneous QHS   insulin  aspart  0-9 Units Subcutaneous TID WC   insulin  glargine  9 Units Subcutaneous Daily   losartan   25 mg Oral Daily   metoCLOPramide  (REGLAN ) injection  5 mg Intravenous Q12H   metoprolol  tartrate  12.5 mg Oral BID   pantoprazole   40 mg Oral Daily   polyethylene glycol  17 g Oral BID   pregabalin   150 mg Oral Daily   rosuvastatin   20 mg Oral Daily   Continuous Infusions:  ceFEPime  (MAXIPIME ) IV 2 g (03/01/24 0522)   promethazine  (PHENERGAN ) injection (IM or IVPB) 6.25 mg (03/01/24 0844)   vancomycin  166.7 mL/hr at 02/29/24 0314   Diet: Diet Order             Diet NPO time specified  Diet effective now                    Data Reviewed: I have personally reviewed following labs and imaging studies ( see epic result tab) CBC: Recent Labs  Lab 02/28/24 2010 02/28/24 2017 02/29/24 0705 03/01/24 0459  WBC 5.9  --  4.4 4.7  HGB 13.0 13.3 11.2* 11.4*  HCT 38.7 39.0 34.8* 35.8*  MCV 83.2  --  86.6 87.5  PLT 237  --  228 211   CMP: Recent Labs  Lab 02/28/24 2010 02/28/24 2017 02/29/24 0705 02/29/24 0706 03/01/24 0459  NA 137 136  --  139 136  K 4.8 5.2*  --  4.5 4.5  CL 104 109  --  106 106  CO2 17*  --   --  18* 21*  GLUCOSE 153* 159*  --  245* 279*  BUN 18 24*  --  16 23*  CREATININE 1.48* 1.40* 1.41* 1.40* 1.43*  CALCIUM  9.5  --   --  8.9 9.0   GFR: Estimated Creatinine Clearance: 42.9 mL/min (A) (by C-G formula based on SCr of 1.43 mg/dL (H)). Recent Labs  Lab 02/28/24 2010  AST 26  ALT 16  ALKPHOS 106  BILITOT 0.9  PROT 7.8  ALBUMIN  3.5   No results for input(s): LIPASE, AMYLASE in the last 168 hours. No results for input(s): AMMONIA in the last 168  hours. Coagulation Profile: No results for input(s): INR, PROTIME in the last 168 hours. Unresulted Labs (From admission, onward)     Start     Ordered   03/06/24 0500  Creatinine, serum  (enoxaparin  (LOVENOX )    CrCl >/= 30 ml/min)  Weekly,   R     Comments: while on enoxaparin  therapy    02/28/24 2333   03/01/24 0500  Basic metabolic panel with GFR  Daily,   R      02/29/24 1013   03/01/24 0500  CBC  Daily,   R      02/29/24 1013   02/28/24 1730  Urinalysis, Routine w reflex microscopic -Urine, Clean Catch  Once,  URGENT       Question:  Specimen Source  Answer:  Urine, Clean Catch   02/28/24 1729           Antimicrobials/Microbiology: Anti-infectives (From admission, onward)    Start     Dose/Rate Route Frequency Ordered Stop   02/29/24 0600  ceFEPIme  (MAXIPIME ) 2 g in sodium chloride  0.9 % 100 mL IVPB        2 g 200 mL/hr over 30 Minutes Intravenous Every 12 hours 02/29/24 0237     02/29/24 0200  vancomycin  (VANCOREADY) IVPB 1250 mg/250 mL        1,250 mg 166.7 mL/hr over 90 Minutes Intravenous Every 48 hours 02/29/24 0102     02/28/24 2145  vancomycin  (VANCOCIN ) IVPB 1000 mg/200 mL premix  Status:  Discontinued        1,000 mg 200 mL/hr over 60 Minutes Intravenous  Once 02/28/24 2143 02/29/24 0102   02/28/24 2145  cefTRIAXone  (ROCEPHIN ) 2 g in sodium chloride  0.9 % 100 mL IVPB        2 g 200 mL/hr over 30 Minutes Intravenous  Once 02/28/24 2143 02/28/24 2351         Component Value Date/Time   SDES BLOOD RIGHT ARM 02/28/2024 2233   SPECREQUEST  02/28/2024 2233    BOTTLES DRAWN AEROBIC AND ANAEROBIC Blood Culture results may not be optimal due to an inadequate volume of blood received in culture bottles   CULT  02/28/2024 2233    NO GROWTH < 12 HOURS Performed at [facility], [address], [place], [place] [address]    REPTSTATUS PENDING 02/28/2024 2233    Procedures: Mennie LAMY, MD [facility] 03/01/2024, 9:25 AM

## 2024-03-01 NOTE — Plan of Care (Signed)
  Problem: Education: Goal: Knowledge of General Education information will improve Description: Including pain rating scale, medication(s)/side effects and non-pharmacologic comfort measures Outcome: Progressing   Problem: Activity: Goal: Risk for activity intolerance will decrease Outcome: Progressing   Problem: Nutrition: Goal: Adequate nutrition will be maintained Outcome: Progressing   Problem: Coping: Goal: Level of anxiety will decrease Outcome: Progressing   Problem: Pain Managment: Goal: General experience of comfort will improve and/or be controlled Outcome: Progressing

## 2024-03-01 NOTE — Anesthesia Preprocedure Evaluation (Signed)
 Anesthesia Evaluation  Patient identified by MRN, date of birth, ID band Patient awake    Reviewed: Allergy  & Precautions, NPO status , Patient's Chart, lab work & pertinent test results  History of Anesthesia Complications (+) PONV and history of anesthetic complications  Airway Mallampati: II  TM Distance: >3 FB Neck ROM: Full    Dental no notable dental hx. (+) Teeth Intact, Dental Advisory Given   Pulmonary    Pulmonary exam normal breath sounds clear to auscultation       Cardiovascular hypertension, Pt. on medications + CAD  Normal cardiovascular exam Rhythm:Regular Rate:Normal     Neuro/Psych  PSYCHIATRIC DISORDERS Anxiety Depression     Neuromuscular disease    GI/Hepatic ,GERD  ,,  Endo/Other  diabetes, Type 1    Renal/GU Renal InsufficiencyRenal diseaseLab Results      Component                Value               Date                       K                        4.5                 03/01/2024                CO2                      21 (L)              03/01/2024                BUN                      23 (H)              03/01/2024                CREATININE               1.43 (H)            03/01/2024                GFRNONAA                 43 (L)              03/01/2024                CALCIUM                   9.0                 03/01/2024                            Musculoskeletal   Abdominal   Peds  Hematology Lab Results      Component                Value               Date                      WBC  4.7                 03/01/2024                HGB                      11.4 (L)            03/01/2024                HCT                      35.8 (L)            03/01/2024                MCV                      87.5                03/01/2024                PLT                      211                 03/01/2024              Anesthesia Other Findings All: Pcn, subutex,  reglan ,  Reproductive/Obstetrics                              Anesthesia Physical Anesthesia Plan  ASA: 3  Anesthesia Plan: MAC   Post-op Pain Management: Tylenol  PO (pre-op)* and Precedex    Induction:   PONV Risk Score and Plan: 3 and Propofol  infusion, Treatment may vary due to age or medical condition, Midazolam  and Ondansetron   Airway Management Planned: Nasal Cannula and Natural Airway  Additional Equipment: None  Intra-op Plan:   Post-operative Plan:   Informed Consent: I have reviewed the patients History and Physical, chart, labs and discussed the procedure including the risks, benefits and alternatives for the proposed anesthesia with the patient or authorized representative who has indicated his/her understanding and acceptance.     Dental advisory given  Plan Discussed with: CRNA and Surgeon  Anesthesia Plan Comments:          Anesthesia Quick Evaluation

## 2024-03-01 NOTE — Progress Notes (Signed)
 PODIATRY CONSULTATION  NAME [name] MRN [id number] DOB 1965/09/28 DOA 02/28/2024   Reason for consult:  Chief Complaint  Patient presents with   Wound Check    Consulting physician:   History of present illness: 58 y.o. female with left hallux osteomyelitis.  She has been having some nausea overnight but she states that she has gastroparesis and stress brings this on. She has been stressed since being in the hospital.  She currently denies any fevers or chills.  Past Medical History:  Diagnosis Date   AKI (acute kidney injury) (HCC)    Anemia, iron  deficiency    Anxiety and depression 05/18/2015   CAD (coronary artery disease), native coronary artery 01/11/2018   s/p CABG - Non-STEMI with severe three-vessel disease noted July 2019   Depression    Diabetic gastroparesis (HCC)    Diabetic neuropathy, type I diabetes mellitus (HCC) 05/18/2015   DKA, type 1 (HCC) 03/18/2016   Essential hypertension    Gastroparesis    GERD (gastroesophageal reflux disease)    HLD (hyperlipidemia)    MDD (major depressive disorder), single episode, severe , no psychosis (HCC)    Tardive dyskinesia    Vitamin B12 deficiency 08/16/2015       Latest Ref Rng & Units 03/01/2024    4:59 AM 02/29/2024    7:05 AM 02/28/2024    8:17 PM  CBC  WBC 4.0 - 10.5 K/uL 4.7  4.4    Hemoglobin 12.0 - 15.0 g/dL 88.5  88.7  86.6   Hematocrit 36.0 - 46.0 % 35.8  34.8  39.0   Platelets 150 - 400 K/uL 211  228         Latest Ref Rng & Units 03/01/2024    4:59 AM 02/29/2024    7:06 AM 02/29/2024    7:05 AM  BMP  Glucose 70 - 99 mg/dL 720  754    BUN 6 - 20 mg/dL 23  16    Creatinine 9.55 - 1.00 mg/dL 8.56  8.59  8.58   Sodium 135 - 145 mmol/L 136  139    Potassium 3.5 - 5.1 mmol/L 4.5  4.5    Chloride 98 - 111 mmol/L 106  106    CO2 22 - 32 mmol/L 21  18    Calcium  8.9 - 10.3 mg/dL 9.0  8.9        Physical Exam: General: The patient is alert and oriented x3 in no acute distress.   Dermatology:  Ulceration noted to the left hallux which is unchanged compared to yesterday.  There is edema and erythema present to the hallux.  Edema present to the foot.  No fluctuation or crepitation.  Vascular: Warm and well-perfused.  Neurological: Decreased  Musculoskeletal Exam: No pain on exam.  Hammertoe deformities    ASSESSMENT/PLAN OF CARE Left hallux osteomyelitis - MRI: Completed but not yet read- follow-up on report - ABI: Normal - Antibiotics: Cefepime , vancomycin  - Given the infection in the left hallux I recommended amputation of the toe which we can discuss today.  She is agreeable to proceeding with this.  Will await the MRI results to make sure that no further infection as well.  We again discussed the surgery as well as postoperative course.  Alternatives, risks, complications were discussed.  We discussed risks of surgery including spread of infection, further surgery/amputation, transfer lesions, as well as general risks of surgery.  Patient scheduled for tomorrow morning.  N.p.o. after midnight. - Will discuss with Dr. Malvin  who is covering tomorrow. She is anxious about surgery but is agreeable.      Thank you for the consult.  Please contact me directly with any questions or concerns.     Donnice Fees, DPM [facility]  Dr. Donnice SAUNDERS. Demika Langenderfer, DPM    [address] [place], [place] [address]                Office [phone]  Fax [phone]

## 2024-03-02 ENCOUNTER — Other Ambulatory Visit: Payer: Self-pay

## 2024-03-02 ENCOUNTER — Inpatient Hospital Stay (HOSPITAL_COMMUNITY): Payer: Self-pay | Admitting: Anesthesiology

## 2024-03-02 ENCOUNTER — Inpatient Hospital Stay (HOSPITAL_COMMUNITY)

## 2024-03-02 ENCOUNTER — Encounter (HOSPITAL_COMMUNITY)
Admission: EM | Disposition: A | Discharge status: Home Health | Payer: Self-pay | Source: Home / Self Care | Attending: Internal Medicine

## 2024-03-02 ENCOUNTER — Encounter (HOSPITAL_COMMUNITY): Payer: Self-pay | Admitting: Internal Medicine

## 2024-03-02 DIAGNOSIS — M869 Osteomyelitis, unspecified: Secondary | ICD-10-CM

## 2024-03-02 DIAGNOSIS — I2510 Atherosclerotic heart disease of native coronary artery without angina pectoris: Secondary | ICD-10-CM

## 2024-03-02 DIAGNOSIS — F418 Other specified anxiety disorders: Secondary | ICD-10-CM

## 2024-03-02 DIAGNOSIS — E1069 Type 1 diabetes mellitus with other specified complication: Secondary | ICD-10-CM

## 2024-03-02 HISTORY — PX: AMPUTATION TOE: SHX6595

## 2024-03-02 LAB — CBC
HCT: 37.5 % (ref 36.0–46.0)
Hemoglobin: 11.6 g/dL — ABNORMAL LOW (ref 12.0–15.0)
MCH: 28 pg (ref 26.0–34.0)
MCHC: 30.9 g/dL (ref 30.0–36.0)
MCV: 90.4 fL (ref 80.0–100.0)
Platelets: 191 K/uL (ref 150–400)
RBC: 4.15 MIL/uL (ref 3.87–5.11)
RDW: 14.9 % (ref 11.5–15.5)
WBC: 4.3 K/uL (ref 4.0–10.5)
nRBC: 0 % (ref 0.0–0.2)

## 2024-03-02 LAB — BASIC METABOLIC PANEL WITH GFR
Anion gap: 13 (ref 5–15)
BUN: 26 mg/dL — ABNORMAL HIGH (ref 6–20)
CO2: 16 mmol/L — ABNORMAL LOW (ref 22–32)
Calcium: 8.8 mg/dL — ABNORMAL LOW (ref 8.9–10.3)
Chloride: 107 mmol/L (ref 98–111)
Creatinine, Ser: 1.56 mg/dL — ABNORMAL HIGH (ref 0.44–1.00)
GFR, Estimated: 39 mL/min — ABNORMAL LOW (ref >60)
Glucose, Bld: 325 mg/dL — ABNORMAL HIGH (ref 70–99)
Potassium: 4.7 mmol/L (ref 3.5–5.1)
Sodium: 136 mmol/L (ref 135–145)

## 2024-03-02 LAB — GLUCOSE, CAPILLARY
Glucose-Capillary: 293 mg/dL — ABNORMAL HIGH (ref 70–99)
Glucose-Capillary: 263 mg/dL — ABNORMAL HIGH (ref 70–99)
Glucose-Capillary: 304 mg/dL — ABNORMAL HIGH (ref 70–99)
Glucose-Capillary: 224 mg/dL — ABNORMAL HIGH (ref 70–99)
Glucose-Capillary: 140 mg/dL — ABNORMAL HIGH (ref 70–99)
Glucose-Capillary: 195 mg/dL — ABNORMAL HIGH (ref 70–99)

## 2024-03-02 LAB — SURGICAL PCR SCREEN
MRSA, PCR: NEGATIVE
Staphylococcus aureus: NEGATIVE

## 2024-03-02 SURGERY — AMPUTATION, TOE
Anesthesia: Monitor Anesthesia Care | Site: Toe | Laterality: Left

## 2024-03-02 MED ORDER — INSULIN ASPART 100 UNIT/ML IJ SOLN
3.0000 [IU] | Freq: Three times a day (TID) | INTRAMUSCULAR | Status: DC
  Administered 2024-03-02 – 2024-03-03 (×2): 3 [IU] via SUBCUTANEOUS

## 2024-03-02 MED ORDER — CHLORHEXIDINE GLUCONATE 0.12 % MT SOLN: 15.0000 mL | Freq: Once | OROMUCOSAL | Status: AC

## 2024-03-02 MED ORDER — CLINDAMYCIN HCL 300 MG PO CAPS
300.0000 mg | ORAL_CAPSULE | Freq: Three times a day (TID) | ORAL | Status: DC
  Administered 2024-03-02 – 2024-03-04 (×7): 300 mg via ORAL
  Filled 2024-03-02 (×10): qty 1

## 2024-03-02 MED ORDER — LIDOCAINE HCL (PF) 1 % IJ SOLN
INTRAMUSCULAR | Status: DC | PRN
  Administered 2024-03-02: 5 mL

## 2024-03-02 MED ORDER — LACTATED RINGERS IV SOLN: INTRAVENOUS | Status: DC

## 2024-03-02 MED ORDER — ORAL CARE MOUTH RINSE
15.0000 mL | Freq: Once | OROMUCOSAL | Status: AC
  Administered 2024-03-02: 15 mL via OROMUCOSAL

## 2024-03-02 MED ORDER — PROPOFOL 500 MG/50ML IV EMUL
INTRAVENOUS | Status: DC | PRN
  Administered 2024-03-02: 100 ug/kg/min via INTRAVENOUS

## 2024-03-02 MED ORDER — SODIUM BICARBONATE 650 MG PO TABS: 650.0000 mg | ORAL_TABLET | Freq: Two times a day (BID) | ORAL | Status: DC

## 2024-03-02 MED ORDER — MIDAZOLAM HCL 2 MG/2ML IJ SOLN
INTRAMUSCULAR | Status: AC
Start: 2024-03-02 — End: 2024-03-02
  Filled 2024-03-02: qty 2

## 2024-03-02 MED ORDER — PHENYLEPHRINE HCL (PRESSORS) 10 MG/ML IV SOLN
INTRAVENOUS | Status: DC | PRN
  Administered 2024-03-02: 80 ug via INTRAVENOUS

## 2024-03-02 MED ORDER — INSULIN ASPART 100 UNIT/ML IJ SOLN
5.0000 [IU] | Freq: Once | INTRAMUSCULAR | Status: AC
  Administered 2024-03-02: 5 [IU] via SUBCUTANEOUS

## 2024-03-02 MED ORDER — LACTATED RINGERS IV SOLN: INTRAVENOUS | Status: DC | PRN

## 2024-03-02 MED ORDER — ACETAMINOPHEN 500 MG PO TABS
1000.0000 mg | ORAL_TABLET | Freq: Once | ORAL | Status: AC
  Administered 2024-03-02: 1000 mg via ORAL
  Filled 2024-03-02: qty 2

## 2024-03-02 MED ORDER — ACETAMINOPHEN 10 MG/ML IV SOLN
1000.0000 mg | Freq: Once | INTRAVENOUS | Status: DC | PRN
Start: 2024-03-02 — End: 2024-03-02

## 2024-03-02 MED ORDER — MIDAZOLAM HCL 5 MG/5ML IJ SOLN
INTRAMUSCULAR | Status: DC | PRN
Start: 2024-03-02 — End: 2024-03-02
  Administered 2024-03-02: 2 mg via INTRAVENOUS

## 2024-03-02 MED ORDER — LIDOCAINE HCL 1 % IJ SOLN
INTRAMUSCULAR | Status: AC
Start: 2024-03-02 — End: 2024-03-02
  Filled 2024-03-02: qty 20

## 2024-03-02 MED ORDER — SODIUM BICARBONATE 650 MG PO TABS
1300.0000 mg | ORAL_TABLET | Freq: Two times a day (BID) | ORAL | Status: DC
Start: 2024-03-02 — End: 2024-03-04
  Administered 2024-03-02 – 2024-03-04 (×5): 1300 mg via ORAL
  Filled 2024-03-02 (×5): qty 2

## 2024-03-02 MED ORDER — FENTANYL CITRATE (PF) 100 MCG/2ML IJ SOLN: 25.0000 ug | INTRAMUSCULAR | Status: DC | PRN

## 2024-03-02 MED ORDER — OXYCODONE HCL 5 MG PO TABS: 5.0000 mg | ORAL_TABLET | Freq: Once | ORAL | Status: DC | PRN

## 2024-03-02 MED ORDER — ONDANSETRON HCL 4 MG/2ML IJ SOLN: 4.0000 mg | Freq: Once | INTRAMUSCULAR | Status: DC | PRN

## 2024-03-02 MED ORDER — SODIUM CHLORIDE 0.9 % IR SOLN
Status: DC | PRN
  Administered 2024-03-02: 1000 mL

## 2024-03-02 MED ORDER — BUPIVACAINE HCL (PF) 0.5 % IJ SOLN
INTRAMUSCULAR | Status: AC
  Filled 2024-03-02: qty 30

## 2024-03-02 MED ORDER — INSULIN GLARGINE 100 UNIT/ML ~~LOC~~ SOLN
18.0000 [IU] | Freq: Every day | SUBCUTANEOUS | Status: DC
  Administered 2024-03-03 – 2024-03-04 (×2): 18 [IU] via SUBCUTANEOUS
  Filled 2024-03-02 (×2): qty 0.18

## 2024-03-02 MED ORDER — OXYCODONE HCL 5 MG/5ML PO SOLN: 5.0000 mg | Freq: Once | ORAL | Status: DC | PRN

## 2024-03-02 MED ORDER — BUPIVACAINE HCL (PF) 0.5 % IJ SOLN
INTRAMUSCULAR | Status: DC | PRN
  Administered 2024-03-02: 5 mL

## 2024-03-02 SURGICAL SUPPLY — 37 items
BLADE AVERAGE 25X9 (BLADE) IMPLANT
BLADE SURG 10 STRL SS (BLADE) ×1 IMPLANT
BLADE SURG 15 STRL LF DISP TIS (BLADE) ×1 IMPLANT
BNDG COHESIVE 3X5 TAN ST LF (GAUZE/BANDAGES/DRESSINGS) ×1 IMPLANT
BNDG COMPR ESMARK 4X3 LF (GAUZE/BANDAGES/DRESSINGS) ×1 IMPLANT
BNDG ELASTIC 3INX 5YD STR LF (GAUZE/BANDAGES/DRESSINGS) ×1 IMPLANT
BNDG ELASTIC 4INX 5YD STR LF (GAUZE/BANDAGES/DRESSINGS) IMPLANT
BNDG GAUZE DERMACEA FLUFF 4 (GAUZE/BANDAGES/DRESSINGS) IMPLANT
BNDG STRETCH 4X75 NS LF (GAUZE/BANDAGES/DRESSINGS) IMPLANT
CHLORAPREP W/TINT 26 (MISCELLANEOUS) IMPLANT
DRSG ADAPTIC 3X8 NADH LF (GAUZE/BANDAGES/DRESSINGS) IMPLANT
DRSG XEROFORM 1X8 (GAUZE/BANDAGES/DRESSINGS) IMPLANT
ELECTRODE REM PT RTRN 9FT ADLT (ELECTROSURGICAL) ×1 IMPLANT
GAUZE PAD ABD 8X10 STRL (GAUZE/BANDAGES/DRESSINGS) IMPLANT
GAUZE SPONGE 2X2 STRL 8-PLY (GAUZE/BANDAGES/DRESSINGS) IMPLANT
GAUZE SPONGE 4X4 12PLY STRL (GAUZE/BANDAGES/DRESSINGS) ×1 IMPLANT
GAUZE STRETCH 2X75IN STRL (MISCELLANEOUS) ×1 IMPLANT
GAUZE XEROFORM 1X8 LF (GAUZE/BANDAGES/DRESSINGS) ×1 IMPLANT
GLOVE BIO SURGEON STRL SZ7.5 (GLOVE) ×1 IMPLANT
GLOVE BIOGEL PI IND STRL 7.5 (GLOVE) ×1 IMPLANT
GOWN STRL REUS W/ TWL LRG LVL3 (GOWN DISPOSABLE) ×2 IMPLANT
KIT BASIN OR (CUSTOM PROCEDURE TRAY) ×1 IMPLANT
NDL HYPO 25X1 1.5 SAFETY (NEEDLE) ×1 IMPLANT
NEEDLE HYPO 25X1 1.5 SAFETY (NEEDLE) ×1 IMPLANT
PACK ORTHO EXTREMITY (CUSTOM PROCEDURE TRAY) ×1 IMPLANT
PADDING CAST ABS COTTON 4X4 ST (CAST SUPPLIES) ×2 IMPLANT
SET HNDPC FAN SPRY TIP SCT (DISPOSABLE) IMPLANT
SPIKE FLUID TRANSFER (MISCELLANEOUS) IMPLANT
STOCKINETTE 4X48 STRL (DRAPES) IMPLANT
SUT ETHILON 3 0 FSLX (SUTURE) IMPLANT
SUT PROLENE 3 0 PS 2 (SUTURE) IMPLANT
SUT PROLENE 4 0 PS 2 18 (SUTURE) IMPLANT
SYR CONTROL 10ML LL (SYRINGE) ×1 IMPLANT
TUBE CONNECTING 12X1/4 (SUCTIONS) IMPLANT
UNDERPAD 30X36 HEAVY ABSORB (UNDERPADS AND DIAPERS) ×1 IMPLANT
WATER STERILE IRR 1000ML POUR (IV SOLUTION) ×1 IMPLANT
YANKAUER SUCT BULB TIP NO VENT (SUCTIONS) IMPLANT

## 2024-03-02 NOTE — Transfer of Care (Signed)
 Immediate Anesthesia Transfer of Care Note  Patient: [name]  Procedure(s) Performed: AMPUTATION, TOE (Left: Toe)  Patient Location: PACU  Anesthesia Type:MAC  Level of Consciousness: awake, alert , oriented, and patient cooperative  Airway & Oxygen Therapy: Patient Spontanous Breathing and Patient connected to face mask oxygen  Post-op Assessment: Report given to RN, Post -op Vital signs reviewed and stable, Patient moving all extremities, Patient moving all extremities X 4, and Patient able to stick tongue midline  Post vital signs: Reviewed and stable  Last Vitals:  Vitals Value Taken Time  BP 123/48 03/02/24 09:30  Temp    Pulse 65 03/02/24 09:30  Resp 15 03/02/24 09:30  SpO2 100 % 03/02/24 09:30  Vitals shown include unfiled device data.  Last Pain:  Vitals:   03/02/24 0831  TempSrc:   PainSc: 0-No pain         Complications: No notable events documented.

## 2024-03-02 NOTE — Op Note (Addendum)
 Full Operative Report  Date of Operation: 9:25 AM, 03/02/2024   Patient: [name] - 58 y.o. female  Surgeon: [name], DPM   Assistant: None  Diagnosis: osteomyelitis of left great toe  Procedure:  1. Amputation of left great toe at MPJ level    Anesthesia: Monitor Anesthesia Care  Yvette Garnette LABOR, MD  Anesthesiologist: Yvette Garnette LABOR, MD CRNA: Yvette Edsel HERO, CRNA   Estimated Blood Loss: 10 mL  Hemostasis: 1) Anatomical dissection, mechanical compression, electrocautery 2) No tourniquet was used during procedure  Implants: * No implants in log *  Materials: prolene 3-0  Injectables: 1) Pre-operatively: 10 cc of 50:50 mixture 1%lidocaine  plain and 0.5% marcaine  plain 2) Post-operatively: None   Specimens: - Pathology: Left hallux for path - Microbiology: Bone culture L great toe    Antibiotics: IV antibiotics given per schedule on the floor  Drains: None  Complications: Patient tolerated the procedure well without complication.   Operative findings: As below in detailed report  Indications for Procedure: [name] presents to [employer], [name], DPM with a chief complaint of ulceration of the left hallux, MRI with concern for osteomyelitis The patient has failed conservative treatments of various modalities. At this time the patient has elected to proceed with surgical correction. All alternatives, risks, and complications of the procedures were thoroughly explained to the patient. Patient exhibits appropriate understanding of all discussion points and informed consent was signed and obtained in the chart with no guarantees to surgical outcome given or implied.  Description of Procedure: Patient was brought to the operating room. Patient remained on their hospital bed in the supine position. A surgical timeout was performed and all members of the operating room, the procedure, and the surgical site were identified. anesthesia  occurred as per anesthesia record. Local anesthetic as previously described was then injected about the operative field in a local infiltrative block.  The operative lower extremity as noted above was then prepped and draped in the usual sterile manner. The following procedure then began.  Attention was directed to the first digit on the LEFT foot. A full-thickness incision encompassing the entire digit was made using a #15 blade. Dissection was carried down to bone. The toe was secured with a towel clamp, further dissected in its entirety, and disarticulated at the MPJ and passed to the back table as a gross specimen. This was then labled and sent to pathology. The bone was noted to be soft and eroded, and consistent with osteomyelitis. A bone culture was harvested with rongeur from the IPJ area. All remaining necrotic and devitalized soft tissue structures were visualized and dissected away using sharp and dull dissection. Care was taken to protect all neurovascular structures throughout the dissection. All bleeders were cauterized as necessary. The area was then flushed with copious amounts of sterile saline. Then using the suture materials previously described, the site was closed in anatomic layers and the skin was well approximated under minimal tension.   The surgical site was then dressed with xeroform 4x4 kerlix and ace wrap. The patient tolerated both the procedure and anesthesia well with vital signs stable throughout. The patient was transferred in good condition and all vital signs stable  from the OR to recovery under the discretion of anesthesia.  Condition: Vital signs stable, neurovascular status unchanged from preoperative   Surgical plan:  Expect clean margin, rec 5 days clindamycin  ( pcn allergy  listed) on discharge. Leave dressing C/D/I follow up next week Tuesday.   The  patient will be WBAT in a post op shoe to the operative limb until further instructed. The dressing is to remain  clean, dry, and intact. Will continue to follow unless noted elsewhere.   [name], DPM [facility]

## 2024-03-02 NOTE — Progress Notes (Signed)
 PROGRESS NOTE [name]  FMW:[id number] DOB: 1966-05-12 DOA: 02/28/2024 PCP: Delbert Clam, MD  Brief Narrative/Hospital Course: [name] is a 58 y.o. year old female with PMH of  type 1 diabetes, anxiety and depression, CAD S/P CABG, diabetic neuropathy, hypertension, GERD, hyperlipidemia who ishaving left foot great toe infection for the last 3 weeks and followed up PCP, who started antibiotic therapy however despite compliance patient noted worsening with color change of the big toe and therefore came in for further management. In ED: Vitals stable afebrile  X-ray of the left foot showed finding of erosions and bony destruction involving the proximal phalanx of the great toe as well as in the distal phalanx of the great toe suspicious for active osteomyelitis.  Podiatry was consulted placed on antibiotics and admitted.  Subjective: Seen and examined overnight afebrile BP stable  Labs shows creatinine slightly up CBC stable, hco3 21>16 Had amputation this morning of the left great toe Currently resting when pain is controlled, sisters at the bedside  Assessment and plan:  Acute osteomyelitis of the left great toe Left foot infection Failed outpatient oral antibiotic therapy: X-ray finding concerning for osteomyelitis,MRI showed >osteomyelitis and septic arthritis of the interphalangeal joint of the great toe, Great toe soft tissue wound without evidence of abscess or soft tissue emphysema. Diffuse forefoot muscular atrophy with mild nonspecific T2 hyperintensity within the flexor musculature. ABI bilateral normal S/p vancomycin  and cefepime  as per podiatry > now on Clinda post op 9/8. S/P left great toe amputation at MPJ level 03/02/24 Continue multimodal pain management  Post op care >Expect clean margin, recommending 5 days clindamycin  ( pcn allergy  listed) on discharge. Leave dressing C/D/I follow up next week Tuesday. WBAT in a post op shoe to the operative limb until  further instructed. Dressing is to remain clean, dry, and intact.  CKD stage III 3a Hyperkalemia-resolved Metabolic acidosis due to CKD: Renal function at baseline, continue to monitor lites, bicarb level has been fluctuating from 17-21 Ivf while npo. Recent Labs    06/21/23 2124 06/22/23 0033 06/22/23 0552 06/22/23 0856 06/22/23 1701 06/23/23 0628 02/28/24 2010 02/28/24 2017 02/29/24 0705 02/29/24 0706 03/01/24 0459 03/02/24 0711  BUN 21* 20 17 19 19  24* 18 24*  --  16 23* 26*  CREATININE 1.61* 1.48* 1.34* 1.41* 1.47* 1.53* 1.48* 1.40* 1.41* 1.40* 1.43* 1.56*  CO2 15* 18* 21* 23 20* 22 17*  --   --  18* 21* 16*  K 4.2 3.8 3.3* 3.8 4.1 4.4 4.8 5.2*  --  4.5 4.5 4.7     Type I diabetes with uncontrolled hyperglycemia and neuropathy Diabetic gastroparesis on Reglan  PTA PTA on insulin  pump, Lantus  9 units twice daily.last A1c 10.28 January 2024.  Continue her Lantus , sliding scale insulin , CBG 130-300s. Cont her Neurontin , and Reglan  and PRN antiemetics.  Recent Labs  Lab 03/01/24 1630 03/01/24 2052 03/02/24 0638 03/02/24 0836 03/02/24 0929  GLUCAP 137* 215* 293* 304* 263*    CAD S/P CABG 6 yrs ago Hypertension Hhyperlipidemia: BP stable continue losartan , statin, metoprolol  and aspirin  81. Home Lasix  on hold   GERD Continue PPI,   Class I Obesity w/ Body mass index is 34.57 kg/m.: Will benefit with PCP follow-up, weight loss,healthy lifestyle and outpatient sleep eval if not done- never had osa eval..  DVT prophylaxis: enoxaparin  (LOVENOX ) injection 40 mg Start: 02/29/24 1800 Code Status:   Code Status: Full Code Family Communication: plan of care discussed with patient at bedside. Patient status is: Remains hospitalized because  of severity of illness Level of care: Telemetry Medical   Dispo: The patient is from: home, lives alone            Anticipated disposition: TBD Objective: Vitals last 24 hrs: Vitals:   03/02/24 0811 03/02/24 0930 03/02/24 0945  03/02/24 1023  BP: (!) 148/57 (!) 123/48 (!) 105/56 (!) 116/50  Pulse: 73 65 65 63  Resp: 17 17 14 16   Temp: 98.2 F (36.8 C) 98.6 F (37 C) 97.9 F (36.6 C) 97.8 F (36.6 C)  TempSrc: Oral   Oral  SpO2: 98% 99% 93%   Weight: 83 kg     Height: 5' 1 (1.549 m)       Physical Examination: General exam: alert awake, oriented, appears nauseous HEENT:Oral mucosa moist, Ear/Nose WNL grossly Respiratory system: Bilaterally clear BS,no use of accessory muscle Cardiovascular system: S1 & S2 +, No JVD. Gastrointestinal system: Abdomen soft,NT,ND, BS+ Nervous System: Alert, awake, moving all extremities,and following commands. Extremities: LE edema neg, distal extremities warm.  Left foot with dressing in place postop  Skin: No rashes,no icterus. MSK: Normal muscle bulk,tone, power   Medications reviewed:  Scheduled Meds:  aspirin  EC  81 mg Oral Daily   clindamycin   300 mg Oral Q8H   enoxaparin  (LOVENOX ) injection  40 mg Subcutaneous Q24H   insulin  aspart  0-5 Units Subcutaneous QHS   insulin  aspart  0-9 Units Subcutaneous TID WC   insulin  glargine  14 Units Subcutaneous Daily   losartan   25 mg Oral Daily   metoCLOPramide  (REGLAN ) injection  5 mg Intravenous Q12H   metoprolol  tartrate  12.5 mg Oral BID   pantoprazole   40 mg Oral Daily   polyethylene glycol  17 g Oral BID   pregabalin   150 mg Oral Daily   rosuvastatin   20 mg Oral Daily   sodium bicarbonate   1,300 mg Oral BID   Continuous Infusions:  promethazine  (PHENERGAN ) injection (IM or IVPB) 6.25 mg (03/01/24 0844)   Diet: Diet Order             Diet heart healthy/carb modified Fluid consistency: Thin  Diet effective now                    Data Reviewed: I have personally reviewed following labs and imaging studies ( see epic result tab) CBC: Recent Labs  Lab 02/28/24 2010 02/28/24 2017 02/29/24 0705 03/01/24 0459 03/02/24 0711  WBC 5.9  --  4.4 4.7 4.3  HGB 13.0 13.3 11.2* 11.4* 11.6*  HCT 38.7 39.0  34.8* 35.8* 37.5  MCV 83.2  --  86.6 87.5 90.4  PLT 237  --  228 211 191   CMP: Recent Labs  Lab 02/28/24 2010 02/28/24 2017 02/29/24 0705 02/29/24 0706 03/01/24 0459 03/02/24 0711  NA 137 136  --  139 136 136  K 4.8 5.2*  --  4.5 4.5 4.7  CL 104 109  --  106 106 107  CO2 17*  --   --  18* 21* 16*  GLUCOSE 153* 159*  --  245* 279* 325*  BUN 18 24*  --  16 23* 26*  CREATININE 1.48* 1.40* 1.41* 1.40* 1.43* 1.56*  CALCIUM  9.5  --   --  8.9 9.0 8.8*  MG  --   --   --   --  1.9  --    GFR: Estimated Creatinine Clearance: 39.3 mL/min (A) (by C-G formula based on SCr of 1.56 mg/dL (H)). Recent Labs  Lab 02/28/24  2010  AST 26  ALT 16  ALKPHOS 106  BILITOT 0.9  PROT 7.8  ALBUMIN  3.5   No results for input(s): LIPASE, AMYLASE in the last 168 hours. No results for input(s): AMMONIA in the last 168 hours. Coagulation Profile: No results for input(s): INR, PROTIME in the last 168 hours. Unresulted Labs (From admission, onward)     Start     Ordered   03/06/24 0500  Creatinine, serum  (enoxaparin  (LOVENOX )    CrCl >/= 30 ml/min)  Weekly,   R     Comments: while on enoxaparin  therapy    02/28/24 2333   03/02/24 0917  Fungus Culture With Stain  RELEASE UPON ORDERING,   TIMED       Comments: Specimen A: Specimen Description left great toe Phone [phone] Immunocompromised?  No  Antibiotic Treatment:  pre op abx Is the patient on airborne/droplet precautions? No Clinical History:  osteomyelitis Special Instructions:  none Specimen Disposition:  OR Specimen Holding     03/02/24 0917   03/02/24 0917  Aerobic/Anaerobic Culture w Gram Stain (surgical/deep wound)  RELEASE UPON ORDERING,   TIMED       Comments: Specimen A: Specimen Description left great toe Phone [phone] Immunocompromised?  No  Antibiotic Treatment:  pre op abx Is the patient on airborne/droplet precautions? No Clinical History:  osteomyelitis Special Instructions:  none Specimen Disposition:  OR  Specimen Holding     03/02/24 0917   03/02/24 0917  Acid Fast Culture with reflexed sensitivities  RELEASE UPON ORDERING,   TIMED       Comments: Specimen A: Specimen Description left great toe Phone [phone] Immunocompromised?  No  Antibiotic Treatment:  pre op abx Is the patient on airborne/droplet precautions? No Clinical History:  osteomyelitis Special Instructions:  none Specimen Disposition:  OR Specimen Holding     03/02/24 0917   03/02/24 0917  Acid Fast Smear (AFB)  RELEASE UPON ORDERING,   TIMED       Comments: Specimen A: Specimen Description left great toe Phone [phone] Immunocompromised?  No  Antibiotic Treatment:  pre op abx Is the patient on airborne/droplet precautions? No Clinical History:  osteomyelitis Special Instructions:  none Specimen Disposition:  OR Specimen Holding     03/02/24 0917   03/01/24 0500  Basic metabolic panel with GFR  Daily,   R      02/29/24 1013   03/01/24 0500  CBC  Daily,   R      02/29/24 1013   02/28/24 1730  Urinalysis, Routine w reflex microscopic -Urine, Clean Catch  Once,   URGENT       Question:  Specimen Source  Answer:  Urine, Clean Catch   02/28/24 1729           Antimicrobials/Microbiology: Anti-infectives (From admission, onward)    Start     Dose/Rate Route Frequency Ordered Stop   03/02/24 0945  clindamycin  (CLEOCIN ) capsule 300 mg        300 mg Oral Every 8 hours 03/02/24 0931 03/07/24 0559   02/29/24 0600  ceFEPIme  (MAXIPIME ) 2 g in sodium chloride  0.9 % 100 mL IVPB  Status:  Discontinued        2 g 200 mL/hr over 30 Minutes Intravenous Every 12 hours 02/29/24 0237 03/02/24 0931   02/29/24 0200  vancomycin  (VANCOREADY) IVPB 1250 mg/250 mL  Status:  Discontinued        1,250 mg 166.7 mL/hr over 90 Minutes Intravenous Every 48 hours 02/29/24 0102 03/02/24  9068   02/28/24 2145  vancomycin  (VANCOCIN ) IVPB 1000 mg/200 mL premix  Status:  Discontinued        1,000 mg 200 mL/hr over 60 Minutes Intravenous   Once 02/28/24 2143 02/29/24 0102   02/28/24 2145  cefTRIAXone  (ROCEPHIN ) 2 g in sodium chloride  0.9 % 100 mL IVPB        2 g 200 mL/hr over 30 Minutes Intravenous  Once 02/28/24 2143 02/28/24 2351         Component Value Date/Time   SDES BLOOD RIGHT ARM 02/28/2024 2233   SPECREQUEST  02/28/2024 2233    BOTTLES DRAWN AEROBIC AND ANAEROBIC Blood Culture results may not be optimal due to an inadequate volume of blood received in culture bottles   CULT  02/28/2024 2233    NO GROWTH 3 DAYS Performed at [facility], [address] [place], [place] [address]    REPTSTATUS PENDING 02/28/2024 2233    Procedures: Procedure(s) (LRB): AMPUTATION, TOE Fritzi LAMY, MD [facility] 03/02/2024, 11:16 AM

## 2024-03-02 NOTE — Progress Notes (Signed)
 History and Physical Interval Note:  03/02/2024 8:18 AM  [name]  has presented today for surgery, with the diagnosis of osteomyelitis of left great toe.  The various methods of treatment have been discussed with the patient and family. After consideration of risks, benefits and other options for treatment, the patient has consented to   Procedure(s): AMPUTATION, TOE (Left) as a surgical intervention.  The patient's history has been reviewed, patient examined, no change in status, stable for surgery.  I have reviewed the patient's chart and labs.  Questions were answered to the patient's satisfaction.     Marsa FALCON Runa Whittingham

## 2024-03-02 NOTE — Anesthesia Postprocedure Evaluation (Signed)
 Anesthesia Post Note  Patient: [name]  Procedure(s) Performed: AMPUTATION, TOE (Left: Toe)     Patient location during evaluation: PACU Anesthesia Type: MAC Level of consciousness: awake and alert Pain management: pain level controlled Vital Signs Assessment: post-procedure vital signs reviewed and stable Respiratory status: spontaneous breathing, nonlabored ventilation, respiratory function stable and patient connected to nasal cannula oxygen Cardiovascular status: stable and blood pressure returned to baseline Postop Assessment: no apparent nausea or vomiting Anesthetic complications: no   No notable events documented.  Last Vitals:  Vitals:   03/02/24 0945 03/02/24 1023  BP: (!) 105/56 (!) 116/50  Pulse: 65 63  Resp: 14 16  Temp: 36.6 C 36.6 C  SpO2: 93%     Last Pain:  Vitals:   03/02/24 1107  TempSrc:   PainSc: 0-No pain                 Garnette DELENA Gab

## 2024-03-02 NOTE — Progress Notes (Signed)
 Patient had a blood sugar of 304.  Dr. Jefm made aware and order received.  Will continue to monitor.

## 2024-03-02 NOTE — Progress Notes (Signed)
 Orthopedic Tech Progress Note Patient Details:  [name] 08/30/1965 [id number]  Ortho Devices Type of Ortho Device: Postop shoe/boot Ortho Device/Splint Location: LLE Ortho Device/Splint Interventions: Ordered, Application, Adjustment, Removal   Post Interventions Patient Tolerated: Well Instructions Provided: Care of device  Delanna LITTIE Pac 03/02/2024, 12:13 PM

## 2024-03-02 NOTE — Plan of Care (Signed)
  Problem: Education: Goal: Knowledge of General Education information will improve Description: Including pain rating scale, medication(s)/side effects and non-pharmacologic comfort measures Outcome: Progressing   Problem: Activity: Goal: Risk for activity intolerance will decrease Outcome: Progressing   Problem: Pain Managment: Goal: General experience of comfort will improve and/or be controlled Outcome: Progressing   Problem: Safety: Goal: Ability to remain free from injury will improve Outcome: Progressing   Problem: Skin Integrity: Goal: Risk for impaired skin integrity will decrease Outcome: Progressing

## 2024-03-02 NOTE — Inpatient Diabetes Management (Addendum)
 Inpatient Diabetes Program Recommendations  AACE/ADA: New Consensus Statement on Inpatient Glycemic Control (2015)  Target Ranges:  Prepandial:   less than 140 mg/dL      Peak postprandial:   less than 180 mg/dL (1-2 hours)      Critically ill patients:  140 - 180 mg/dL   Lab Results  Component Value Date   GLUCAP 263 (H) 03/02/2024   HGBA1C 10.5 (A) 02/12/2024    Review of Glycemic Control  Latest Reference Range & Units 03/01/24 16:30 03/01/24 20:52 03/02/24 06:38 03/02/24 08:36 03/02/24 09:29  Glucose-Capillary 70 - 99 mg/dL 862 (H) 784 (H) 706 (H) 304 (H) 263 (H)  (H): Data is abnormally high Diabetes history: Type 1 DM Outpatient Diabetes medications: Lantus  9 units BID, Novolog  10-12 units TID Current orders for Inpatient glycemic control: Lantus  14 units every day, Novolog  0-9 units TID & HS  Inpatient Diabetes Program Recommendations:    Consider further increasing Lantus  to 18 units every day and Novolog  4 units TID (assuming patient is consuming >50% of meals)  Spoke with patient regarding outpatient diabetes management. Patient reports using a pump for outpatient use but took it off before coming into hospital. Also, confirms having a lot of lows in the middle of the night.  Reviewed patient's current A1c of 10.5%. Explained what a A1c is and what it measures. Also reviewed goal A1c with patient, importance of good glucose control @ home, and blood sugar goals. Reviewed patho of DM, CGM, risks of hypoglycemia, vascular changes, impact of healing and other commorbidities.  Patient usually wears Dexcom but fell off prior to hospitalization. Is followed by Dr Mercie with new appointment in October. Encouraged to continue with injections until pump is reassessed by endocrinology due to having multiple episodes of hypoglycemia. Reports eating honey buns to bring her sugar back up.  Dexcom provided. Reviewed additional importance of protein, how to improve hypoglycemia risks,  sending secure messages through patient portal for rate changes and ensuring 15 grams to correct hypoglycemia.  No additional questions at this time.    Thanks, Tinnie Minus, MSN, RNC-OB Diabetes Coordinator [phone] (8a-5p)

## 2024-03-03 ENCOUNTER — Other Ambulatory Visit (HOSPITAL_COMMUNITY): Payer: Self-pay

## 2024-03-03 ENCOUNTER — Encounter (HOSPITAL_COMMUNITY): Payer: Self-pay | Admitting: Podiatry

## 2024-03-03 DIAGNOSIS — M869 Osteomyelitis, unspecified: Secondary | ICD-10-CM | POA: Diagnosis not present

## 2024-03-03 LAB — CBC
HCT: 36.8 % (ref 36.0–46.0)
Hemoglobin: 11.7 g/dL — ABNORMAL LOW (ref 12.0–15.0)
MCH: 27.7 pg (ref 26.0–34.0)
MCHC: 31.8 g/dL (ref 30.0–36.0)
MCV: 87.2 fL (ref 80.0–100.0)
Platelets: 218 K/uL (ref 150–400)
RBC: 4.22 MIL/uL (ref 3.87–5.11)
RDW: 14.6 % (ref 11.5–15.5)
WBC: 5.4 K/uL (ref 4.0–10.5)
nRBC: 0 % (ref 0.0–0.2)

## 2024-03-03 LAB — BASIC METABOLIC PANEL WITH GFR
Anion gap: 11 (ref 5–15)
BUN: 23 mg/dL — ABNORMAL HIGH (ref 6–20)
CO2: 19 mmol/L — ABNORMAL LOW (ref 22–32)
Calcium: 9.2 mg/dL (ref 8.9–10.3)
Chloride: 105 mmol/L (ref 98–111)
Creatinine, Ser: 1.43 mg/dL — ABNORMAL HIGH (ref 0.44–1.00)
GFR, Estimated: 43 mL/min — ABNORMAL LOW (ref >60)
Glucose, Bld: 304 mg/dL — ABNORMAL HIGH (ref 70–99)
Potassium: 4.1 mmol/L (ref 3.5–5.1)
Sodium: 135 mmol/L (ref 135–145)

## 2024-03-03 LAB — GLUCOSE, CAPILLARY
Glucose-Capillary: 135 mg/dL — ABNORMAL HIGH (ref 70–99)
Glucose-Capillary: 296 mg/dL — ABNORMAL HIGH (ref 70–99)
Glucose-Capillary: 272 mg/dL — ABNORMAL HIGH (ref 70–99)
Glucose-Capillary: 221 mg/dL — ABNORMAL HIGH (ref 70–99)
Glucose-Capillary: 147 mg/dL — ABNORMAL HIGH (ref 70–99)

## 2024-03-03 LAB — SURGICAL PATHOLOGY

## 2024-03-03 MED ORDER — INSULIN ASPART 100 UNIT/ML IJ SOLN
5.0000 [IU] | Freq: Three times a day (TID) | INTRAMUSCULAR | Status: DC
  Administered 2024-03-03 – 2024-03-04 (×3): 5 [IU] via SUBCUTANEOUS

## 2024-03-03 MED ORDER — CLINDAMYCIN HCL 300 MG PO CAPS
300.0000 mg | ORAL_CAPSULE | Freq: Three times a day (TID) | ORAL | 0 refills | Status: AC
  Filled 2024-03-03: qty 12, 4d supply, fill #0

## 2024-03-03 NOTE — Evaluation (Addendum)
 Physical Therapy Evaluation Patient Details Name: [name] MRN: [id number] DOB: Nov 04, 1965 Today's Date: 03/03/2024  History of Present Illness  57 y.o. female presents to [facility] hospital on 02/28/2024 with L great toe infection. Imaging notable for osteomyelitis. Pt underwent L great toe amputation on 03/02/2024. PMH includes DM, anxiety, depression, CABG, diabetic neuropathy, HTN, GERD, HLD.  Clinical Impression  PTA pt living alone in single story apartment with level entry. Pt utilizing RW due to R foot drop, reports independence with ADLs and iADLs, use friends and family or [employer] for transportation. Pt is currently limited in safe mobility by historic R foot drop in addition to new L hallux amputation and post op boot in presence of decreased strength and balance. Pt with difficulty in donning post op shoe. PT modified so that pt could slip foot into shoe without completely removing velcro strap. Pt is mod I for bed mobility, and contact guard for transfers and initial ambulation able to progress to supervision for ambulation on straight level path. PT recommending HHPT to continue to work on strength and balance at discharge. PT will continue to follow acutely.         If plan is discharge home, recommend the following: A little help with walking and/or transfers;A little help with bathing/dressing/bathroom;Assistance with cooking/housework;Assist for transportation;Direct supervision/assist for medications management   Can travel by private vehicle        Equipment Recommendations None recommended by PT     Functional Status Assessment Patient has had a recent decline in their functional status and demonstrates the ability to make significant improvements in function in a reasonable and predictable amount of time.     Precautions / Restrictions Precautions Precautions: Fall Restrictions Weight Bearing Restrictions Per Provider Order: Yes LUE Weight Bearing Per Provider Order: Weight  bearing as tolerated Other Position/Activity Restrictions: with post op shoe      Mobility  Bed Mobility Overal bed mobility: Modified Independent             General bed mobility comments: HoB elevated but able to come to EoB without assist    Transfers Overall transfer level: Modified independent Equipment used: Rolling walker (2 wheels)               General transfer comment: good power up and self steadying in RW    Ambulation/Gait Ambulation/Gait assistance: Contact guard assist, Supervision Gait Distance (Feet): 150 Feet Assistive device: Rolling walker (2 wheels) Gait Pattern/deviations: Step-to pattern, Decreased weight shift to left, Decreased dorsiflexion - right, Decreased stance time - left, Shuffle, Trunk flexed Gait velocity: slowed Gait velocity interpretation: <1.31 ft/sec, indicative of household ambulator   General Gait Details: contact guard progressing to contact guard for safety for step through gait, R foot drop made less problematic in swing phase due to elevation of post op shoe     Balance Overall balance assessment: Needs assistance Sitting-balance support: Feet supported, No upper extremity supported Sitting balance-Leahy Scale: Good     Standing balance support: Bilateral upper extremity supported, During functional activity, Reliant on assistive device for balance Standing balance-Leahy Scale: Poor Standing balance comment: requires UE support for safety with standing balance                             Pertinent Vitals/Pain Pain Assessment Pain Assessment: 0-10 Pain Score: 8  Pain Location: L foot Pain Descriptors / Indicators: Throbbing Pain Intervention(s): Limited activity within patient's tolerance, Monitored during  session, Repositioned    Home Living Family/patient expects to be discharged to:: Private residence Living Arrangements: Alone Available Help at Discharge: Family;Available  PRN/intermittently Type of Home: Apartment Home Access: Level entry       Home Layout: One level Home Equipment: Agricultural consultant (2 wheels);Grab bars - tub/shower;Hand held shower head      Prior Function Prior Level of Function : Needs assist             Mobility Comments: uses RW for historic R foot drop ADLs Comments: use tub bench, and uses sink to assist in getting on and off toilet, uses SCAT for appointments     Extremity/Trunk Assessment   Upper Extremity Assessment Upper Extremity Assessment: Defer to OT evaluation    Lower Extremity Assessment Lower Extremity Assessment: RLE deficits/detail;LLE deficits/detail RLE Deficits / Details: R foot drop at baseline, ROM WFL for mobility RLE Coordination: decreased fine motor LLE Deficits / Details: new L hallux amp, lacks hip external rotation and knee flexion to be able to don post op shoe in figure four positioning LLE Sensation: decreased light touch LLE Coordination: decreased fine motor    Cervical / Trunk Assessment Cervical / Trunk Assessment: Kyphotic  Communication   Communication Communication: Impaired Factors Affecting Communication: Difficulty expressing self    Cognition Arousal: Alert Behavior During Therapy: WFL for tasks assessed/performed, Flat affect   PT - Cognitive impairments: Problem solving, Sequencing                       PT - Cognition Comments: very slowed processing, decreased insight into problems to be able to solve on her own Following commands: Intact       Cueing Cueing Techniques: Verbal cues, Tactile cues, Visual cues, Gestural cues     General Comments General comments (skin integrity, edema, etc.): VSS on RA, Pt with difficulty donning post op shoe. PT modified to be able to place shoe without completely disconnecting top strap.         Assessment/Plan    PT Assessment Patient needs continued PT services  PT Problem List Decreased strength;Decreased  range of motion;Decreased activity tolerance;Decreased balance;Decreased mobility;Decreased cognition;Decreased safety awareness;Impaired sensation;Pain;Decreased skin integrity       PT Treatment Interventions DME instruction;Gait training;Functional mobility training;Therapeutic activities;Therapeutic exercise;Balance training;Cognitive remediation;Patient/family education    PT Goals (Current goals can be found in the Care Plan section)  Acute Rehab PT Goals PT Goal Formulation: With patient Time For Goal Achievement: 03/17/24 Potential to Achieve Goals: Fair    Frequency Min 2X/week        AM-PAC PT 6 Clicks Mobility  Outcome Measure Help needed turning from your back to your side while in a flat bed without using bedrails?: None Help needed moving from lying on your back to sitting on the side of a flat bed without using bedrails?: None Help needed moving to and from a bed to a chair (including a wheelchair)?: A Little Help needed standing up from a chair using your arms (e.g., wheelchair or bedside chair)?: A Little Help needed to walk in hospital room?: A Little Help needed climbing 3-5 steps with a railing? : A Lot 6 Click Score: 19    End of Session Equipment Utilized During Treatment: Gait belt;Other (comment) (L post op shoe) Activity Tolerance: Patient tolerated treatment well Patient left: in chair;Other (comment) (with OT) Nurse Communication: Mobility status PT Visit Diagnosis: Unsteadiness on feet (R26.81);Other abnormalities of gait and mobility (R26.89);Muscle weakness (generalized) (M62.81);Difficulty  in walking, not elsewhere classified (R26.2);Pain Pain - Right/Left: Right Pain - part of body: Ankle and joints of foot    Time: 8878-8843 PT Time Calculation (min) (ACUTE ONLY): 35 min   Charges:   PT Evaluation $PT Eval Moderate Complexity: 1 Mod PT Treatments $Gait Training: 8-22 mins PT General Charges $$ ACUTE PT VISIT: 1 Visit          Bassy Fetterly B. Fleeta Lapidus PT, DPT Acute Rehabilitation Services Please use secure chat or  Call Office [phone]   Almarie KATHEE Fleeta [facility] 03/03/2024, 12:35 PM

## 2024-03-03 NOTE — Progress Notes (Signed)
  Subjective:  Patient ID: [name], female    DOB: 1966-04-30,  MRN: [id number]  Chief Complaint  Patient presents with   Wound Check    DOS: 03/02/24 Procedure: 1. Amputation of left great toe at MPJ level     58 y.o. female seen for post op check. Pt doing well she is having some pain, taking tramadol . We discussed the findings from surgery and plan for follow up next week as well as need  to keep dressing c/d/I until follow up no showers / getting dressing wet.  Review of Systems: Negative except as noted in the HPI. Denies N/V/F/Ch.   Objective:   Constitutional Well developed. Well nourished.  Vascular Foot warm and well perfused. Capillary refill normal to all digits.   No calf pain with palpation  Neurologic Normal speech. Oriented to person, place, and time. Epicritic sensation diminished  Dermatologic Dressing C/D/I  Orthopedic: S/p L hallux amp MPJ level   Radiographs: Interval amputation of the first digit at the metatarsophalangeal joint. No complicating features.  Pathology: pending  Micro: Rare GPC pending  Assessment:   1. Osteomyelitis of toe (HCC)   2. Diabetic neuropathy, type I diabetes mellitus (HCC)   S/p L hallux amp MPJ level  Plan:  Patient was evaluated and treated and all questions answered.  POD # 1 s/p L hallux amputation at MPJ level -Progressing as expected, expect clean margin -XR: Expected post op changes -WB Status: WBAT in post op shoe -Sutures: remain intact 2-3 weeks. -Medications/ABX: recommend 5 days clindamycin  on DC, tramadol  prn for pain -Dressing: Dressing to remain C/D/I until follow up next week tuesday - F/u Plan: stable for dc from my standpoint, follow up next week Tuesday office to call to arrange. Will sign off.        Marolyn JULIANNA Honour, DPM [facility] / [facility]

## 2024-03-03 NOTE — Progress Notes (Signed)
 PROGRESS NOTE [name]  FMW:[id number] DOB: [birth year] DOA: 02/28/2024 PCP: Delbert Clam, MD  Brief Narrative/Hospital Course: [name] is a 58 y.o. year old female with PMH of  type 1 diabetes, anxiety and depression, CAD S/P CABG, diabetic neuropathy, hypertension, GERD, hyperlipidemia who ishaving left foot great toe infection for the last 3 weeks and followed up PCP, who started antibiotic therapy however despite compliance patient noted worsening with color change of the big toe and therefore came in for further management. In ED: Vitals stable afebrile  X-ray of the left foot showed finding of erosions and bony destruction involving the proximal phalanx of the great toe as well as in the distal phalanx of the great toe suspicious for active osteomyelitis.  Podiatry was consulted placed on antibiotics and admitted. MRI showed >osteomyelitis and septic arthritis of the interphalangeal joint of the great toe, Great toe soft tissue wound without evidence of abscess or soft tissue emphysema. Diffuse forefoot muscular atrophy with mild nonspecific T2 hyperintensity within the flexor musculature. ABI bilateral normal S/P left great toe amputation at MPJ level 03/02/24 Postop doing well. PT OT eval for disposition  Subjective: Seen and examined Pain relatedly stable.  Waiting for podiatry Overnight afebrile BP stable on room air CBG this morning 140s-290s Hesitant to go home today  Assessment and Plan:  Acute osteomyelitis of the left great toe Left foot infection Failed outpatient oral antibiotic therapy: X-ray finding concerning for osteomyelitis,MRI showed >osteomyelitis and septic arthritis of the interphalangeal joint of the great toe, Great toe soft tissue wound without evidence of abscess or soft tissue emphysema. Diffuse forefoot muscular atrophy with mild nonspecific T2 hyperintensity within the flexor musculature. ABI bilateral normal vancomycin  and cefepime  changed to  Clinda post op 03/02/24. S/P left great toe amputation at MPJ level 03/02/24 Continue multimodal pain management Discussed with podiatry after PT eval plan for discharge if cleared.  Post op care >Expect clean margin, recommending 5 days clindamycin  ( pcn allergy  listed) on discharge. Leave dressing C/D/I follow up next week Tuesday. WBAT in a post op shoe to the operative limb until further instructed. Dressing is to remain clean, dry, and intact.  PT OT eval  CKD stage III 3a Hyperkalemia-resolved Metabolic acidosis due to CKD: Renal function at baseline, continue to monitor lites, bicarb level has been fluctuating. Continue oral bicarb, monitor renal function intermittently. Recent Labs    06/21/23 2124 06/22/23 0033 06/22/23 0552 06/22/23 0856 06/22/23 1701 06/23/23 0628 02/28/24 2010 02/28/24 2017 02/29/24 0705 02/29/24 0706 03/01/24 0459 03/02/24 0711  BUN 21* 20 17 19 19  24* 18 24*  --  16 23* 26*  CREATININE 1.61* 1.48* 1.34* 1.41* 1.47* 1.53* 1.48* 1.40* 1.41* 1.40* 1.43* 1.56*  CO2 15* 18* 21* 23 20* 22 17*  --   --  18* 21* 16*  K 4.2 3.8 3.3* 3.8 4.1 4.4 4.8 5.2*  --  4.5 4.5 4.7     TDM w/ uncontrolled hyperglycemia and neuropathy Diabetic gastroparesis on Reglan  PTA PTA on insulin  pump, Lantus  9 units twice daily.last A1c 10.28 January 2024.  Continue her current Lantus -with increased to 18 units and NovoLog  4 units 3 times daily Premeal. Cont SSI,neurontin , and Reglan  and PRN antiemetics.  Recent Labs  Lab 03/02/24 1638 03/02/24 1732 03/02/24 2113 03/03/24 0653 03/03/24 1113  GLUCAP 135* 140* 195* 296* 272*    CAD S/P CABG 6 yrs ago Hypertension Hhyperlipidemia: BP stable continue losartan , statin, metoprolol  and aspirin  81. Home Lasix  on hold   GERD Continue  PPI,   Class I Obesity w/ Body mass index is 34.57 kg/m.: Will benefit with PCP follow-up, weight loss,healthy lifestyle and outpatient sleep eval if not done- never had osa eval..  DVT  prophylaxis: enoxaparin  (LOVENOX ) injection 40 mg Start: 02/29/24 1800 Code Status:   Code Status: Full Code Family Communication: plan of care discussed with patient at bedside. Patient status is: Remains hospitalized because of severity of illness Level of care: Telemetry Medical   Dispo: The patient is from: home, lives alone            Anticipated disposition: TBD pending podiatry clearance Objective: Vitals last 24 hrs: Vitals:   03/02/24 1553 03/02/24 2211 03/03/24 0431 03/03/24 0822  BP: (!) 145/79 (!) 131/53 (!) 142/71 (!) 138/55  Pulse: (!) 57 (!) 58 65 68  Resp: 18 16 16 16   Temp: 98.1 F (36.7 C) 98 F (36.7 C) 97.6 F (36.4 C) 98.2 F (36.8 C)  TempSrc:  Oral Oral Oral  SpO2: 98% 99% 98% 94%  Weight:      Height:        Physical Examination: General exam: Alert awake  HEENT:Oral mucosa moist, Ear/Nose WNL grossly Respiratory system: Bilaterally clear BS,no use of accessory muscle Cardiovascular system: S1 & S2 +, No JVD. Gastrointestinal system: Abdomen soft,NT,ND, BS+ Nervous System: Alert, awake, moving all extremities,and following commands. Extremities: distal extremities warm.  Left foot with dressing in place postop  Skin: No rashes,no icterus. MSK: Normal muscle bulk,tone, power   Medications reviewed:  Scheduled Meds:  aspirin  EC  81 mg Oral Daily   clindamycin   300 mg Oral Q8H   enoxaparin  (LOVENOX ) injection  40 mg Subcutaneous Q24H   insulin  aspart  0-5 Units Subcutaneous QHS   insulin  aspart  0-9 Units Subcutaneous TID WC   insulin  aspart  5 Units Subcutaneous TID WC   insulin  glargine  18 Units Subcutaneous Daily   losartan   25 mg Oral Daily   metoCLOPramide  (REGLAN ) injection  5 mg Intravenous Q12H   metoprolol  tartrate  12.5 mg Oral BID   pantoprazole   40 mg Oral Daily   polyethylene glycol  17 g Oral BID   pregabalin   150 mg Oral Daily   rosuvastatin   20 mg Oral Daily   sodium bicarbonate   1,300 mg Oral BID   Continuous Infusions:   promethazine  (PHENERGAN ) injection (IM or IVPB) 6.25 mg (03/01/24 0844)   Diet: Diet Order             Diet Carb Modified           Diet heart healthy/carb modified Fluid consistency: Thin  Diet effective now                    Data Reviewed: I have personally reviewed following labs and imaging studies ( see epic result tab) CBC: Recent Labs  Lab 02/28/24 2010 02/28/24 2017 02/29/24 0705 03/01/24 0459 03/02/24 0711  WBC 5.9  --  4.4 4.7 4.3  HGB 13.0 13.3 11.2* 11.4* 11.6*  HCT 38.7 39.0 34.8* 35.8* 37.5  MCV 83.2  --  86.6 87.5 90.4  PLT 237  --  228 211 191   CMP: Recent Labs  Lab 02/28/24 2010 02/28/24 2017 02/29/24 0705 02/29/24 0706 03/01/24 0459 03/02/24 0711  NA 137 136  --  139 136 136  K 4.8 5.2*  --  4.5 4.5 4.7  CL 104 109  --  106 106 107  CO2 17*  --   --  18* 21* 16*  GLUCOSE 153* 159*  --  245* 279* 325*  BUN 18 24*  --  16 23* 26*  CREATININE 1.48* 1.40* 1.41* 1.40* 1.43* 1.56*  CALCIUM  9.5  --   --  8.9 9.0 8.8*  MG  --   --   --   --  1.9  --    GFR: Estimated Creatinine Clearance: 39.3 mL/min (A) (by C-G formula based on SCr of 1.56 mg/dL (H)). Recent Labs  Lab 02/28/24 2010  AST 26  ALT 16  ALKPHOS 106  BILITOT 0.9  PROT 7.8  ALBUMIN  3.5   No results for input(s): LIPASE, AMYLASE in the last 168 hours. No results for input(s): AMMONIA in the last 168 hours. Coagulation Profile: No results for input(s): INR, PROTIME in the last 168 hours. Unresulted Labs (From admission, onward)     Start     Ordered   03/06/24 0500  Creatinine, serum  (enoxaparin  (LOVENOX )    CrCl >/= 30 ml/min)  Weekly,   R     Comments: while on enoxaparin  therapy    02/28/24 2333   03/02/24 0917  Fungus Culture With Stain  RELEASE UPON ORDERING,   TIMED       Comments: Specimen A: Specimen Description left great toe Phone [phone] Immunocompromised?  No  Antibiotic Treatment:  pre op abx Is the patient on airborne/droplet precautions?  No Clinical History:  osteomyelitis Special Instructions:  none Specimen Disposition:  OR Specimen Holding     03/02/24 0917   03/02/24 0917  Acid Fast Culture with reflexed sensitivities  RELEASE UPON ORDERING,   TIMED       Comments: Specimen A: Specimen Description left great toe Phone [phone] Immunocompromised?  No  Antibiotic Treatment:  pre op abx Is the patient on airborne/droplet precautions? No Clinical History:  osteomyelitis Special Instructions:  none Specimen Disposition:  OR Specimen Holding     03/02/24 0917   03/02/24 0917  Acid Fast Smear (AFB)  RELEASE UPON ORDERING,   TIMED       Comments: Specimen A: Specimen Description left great toe Phone [phone] Immunocompromised?  No  Antibiotic Treatment:  pre op abx Is the patient on airborne/droplet precautions? No Clinical History:  osteomyelitis Special Instructions:  none Specimen Disposition:  OR Specimen Holding     03/02/24 0917   03/01/24 0500  Basic metabolic panel with GFR  Daily,   R      02/29/24 1013   03/01/24 0500  CBC  Daily,   R      02/29/24 1013   02/28/24 1730  Urinalysis, Routine w reflex microscopic -Urine, Clean Catch  Once,   URGENT       Question:  Specimen Source  Answer:  Urine, Clean Catch   02/28/24 1729           Antimicrobials/Microbiology: Anti-infectives (From admission, onward)    Start     Dose/Rate Route Frequency Ordered Stop   03/03/24 0000  clindamycin  (CLEOCIN ) 300 MG capsule        300 mg Oral 3 times daily 03/03/24 1034 03/07/24 2359   03/02/24 0945  clindamycin  (CLEOCIN ) capsule 300 mg        300 mg Oral Every 8 hours 03/02/24 0931 03/07/24 0559   02/29/24 0600  ceFEPIme  (MAXIPIME ) 2 g in sodium chloride  0.9 % 100 mL IVPB  Status:  Discontinued        2 g 200 mL/hr over 30 Minutes Intravenous Every 12 hours  02/29/24 0237 03/02/24 0931   02/29/24 0200  vancomycin  (VANCOREADY) IVPB 1250 mg/250 mL  Status:  Discontinued        1,250 mg 166.7 mL/hr over 90  Minutes Intravenous Every 48 hours 02/29/24 0102 03/02/24 0931   02/28/24 2145  vancomycin  (VANCOCIN ) IVPB 1000 mg/200 mL premix  Status:  Discontinued        1,000 mg 200 mL/hr over 60 Minutes Intravenous  Once 02/28/24 2143 02/29/24 0102   02/28/24 2145  cefTRIAXone  (ROCEPHIN ) 2 g in sodium chloride  0.9 % 100 mL IVPB        2 g 200 mL/hr over 30 Minutes Intravenous  Once 02/28/24 2143 02/28/24 2351         Component Value Date/Time   SDES TISSUE 03/02/2024 0915   SPECREQUEST LEFT GREAT TOE 03/02/2024 0915   CULT  03/02/2024 0915    CULTURE REINCUBATED FOR BETTER GROWTH Performed at [facility], [address] [place], [place] [address]    REPTSTATUS PENDING 03/02/2024 9084    Procedures: Procedure(s) (LRB): AMPUTATION, TOE Fritzi LAMY, MD [facility] 03/03/2024, 11:53 AM

## 2024-03-03 NOTE — Evaluation (Signed)
 Occupational Therapy Evaluation Patient Details Name: [name] MRN: [id number] DOB: [birth year] Today's Date: 03/03/2024   History of Present Illness   58 y.o. female presents to [facility] hospital on 02/28/2024 with L great toe infection. Imaging notable for osteomyelitis. Pt underwent L great toe amputation on 03/02/2024. PMH includes DM, anxiety, depression, CABG, diabetic neuropathy, HTN, GERD, HLD.     Clinical Impressions [name] was evaluated s/p the above admission list. She is mod I at baseline and lives alone. Upon evaluation the pt was limited by limited BLE ROM, L foot pain and limited activity tolerance. Overall she is mod I for mobility with RW, generalized supervision A provided for safety only. Due to the deficits listed below the pt also needs up to min A for LB ADLs with significantly increased time for post-op shoe management. Pt will benefit from continued acute OT services and HHOT.      If plan is discharge home, recommend the following:   A little help with bathing/dressing/bathroom;Assist for transportation     Functional Status Assessment   Patient has had a recent decline in their functional status and demonstrates the ability to make significant improvements in function in a reasonable and predictable amount of time.     Equipment Recommendations   None recommended by OT      Precautions/Restrictions   Precautions Precautions: Fall Restrictions Weight Bearing Restrictions Per Provider Order: Yes LUE Weight Bearing Per Provider Order: Weight bearing as tolerated     Mobility Bed Mobility               General bed mobility comments: OOB on arrival    Transfers Overall transfer level: Modified independent                        Balance Overall balance assessment: Needs assistance Sitting-balance support: Feet supported, No upper extremity supported Sitting balance-Leahy Scale: Good     Standing balance support: Bilateral  upper extremity supported, During functional activity, Reliant on assistive device for balance Standing balance-Leahy Scale: Poor           ADL either performed or assessed with clinical judgement   ADL Overall ADL's : Needs assistance/impaired Eating/Feeding: Independent   Grooming: Modified independent   Upper Body Bathing: Modified independent   Lower Body Bathing: Supervison/ safety Lower Body Bathing Details (indicate cue type and reason): educated pt on sponge baths only until the MD clears her for showers Upper Body Dressing : Modified independent   Lower Body Dressing: Minimal assistance;Sit to/from stand Lower Body Dressing Details (indicate cue type and reason): min A for management of post op shoe Toilet Transfer: Supervision/safety;Ambulation;Rolling walker (2 wheels)   Toileting- Clothing Manipulation and Hygiene: Modified independent;Sitting/lateral lean       Functional mobility during ADLs: Supervision/safety;Rolling walker (2 wheels) General ADL Comments: pt required assist to doff/don her post-op shoe. A longer strp was applied so she can reach it, she is unable to get into a figure four position     Vision Baseline Vision/History: 0 No visual deficits Vision Assessment?: No apparent visual deficits     Perception Perception: Within Functional Limits       Praxis Praxis: WFL       Pertinent Vitals/Pain Pain Assessment Pain Assessment: Faces Faces Pain Scale: Hurts even more Pain Location: L foot Pain Descriptors / Indicators: Throbbing Pain Intervention(s): Limited activity within patient's tolerance, Monitored during session, Patient requesting pain meds-RN notified     Extremity/Trunk Assessment  Upper Extremity Assessment Upper Extremity Assessment: Generalized weakness;Overall WFL for tasks assessed   Lower Extremity Assessment Lower Extremity Assessment: Defer to PT evaluation RLE Deficits / Details: R foot drop at baseline, ROM WFL for  mobility RLE Coordination: decreased fine motor LLE Deficits / Details: new L hallux amp, lacks hip external rotation and knee flexion to be able to don post op shoe in figure four positioning LLE Sensation: decreased light touch LLE Coordination: decreased fine motor   Cervical / Trunk Assessment Cervical / Trunk Assessment: Kyphotic   Communication Communication Communication: Impaired Factors Affecting Communication: Difficulty expressing self   Cognition Arousal: Alert Behavior During Therapy: WFL for tasks assessed/performed, Flat affect Cognition: Cognition impaired     Awareness: Online awareness impaired     Executive functioning impairment (select all impairments): Problem solving, Reasoning OT - Cognition Comments: overall cog is [facility] for basic tasks and orientation. pt needed signigficant cues and education for problem solving                 Following commands: Intact       Cueing  General Comments   Cueing Techniques: Verbal cues;Tactile cues;Visual cues;Gestural cues  VSS on RA           Home Living Family/patient expects to be discharged to:: Private residence Living Arrangements: Alone Available Help at Discharge: Family;Available PRN/intermittently Type of Home: Apartment Home Access: Level entry     Home Layout: One level     Bathroom Shower/Tub: Chief Strategy Officer: Standard Bathroom Accessibility: Yes   Home Equipment: Agricultural consultant (2 wheels);Grab bars - tub/shower;Hand held shower head;Tub bench          Prior Functioning/Environment Prior Level of Function : Needs assist             Mobility Comments: uses RW for historic R foot drop ADLs Comments: use tub bench, and uses sink to assist in getting on and off toilet, uses SCAT for appointments    OT Problem List: Decreased range of motion;Decreased activity tolerance;Impaired balance (sitting and/or standing)   OT Treatment/Interventions: Self-care/ADL  training;Therapeutic exercise;DME and/or AE instruction;Therapeutic activities;Balance training;Patient/family education      OT Goals(Current goals can be found in the care plan section)   Acute Rehab OT Goals Patient Stated Goal: less pain OT Goal Formulation: With patient Time For Goal Achievement: 03/17/24 Potential to Achieve Goals: Good ADL Goals Pt Will Perform Lower Body Dressing: with modified independence;sit to/from stand   OT Frequency:  Min 2X/week       AM-PAC OT 6 Clicks Daily Activity     Outcome Measure Help from another person eating meals?: None Help from another person taking care of personal grooming?: A Little Help from another person toileting, which includes using toliet, bedpan, or urinal?: A Little Help from another person bathing (including washing, rinsing, drying)?: A Little Help from another person to put on and taking off regular upper body clothing?: A Little Help from another person to put on and taking off regular lower body clothing?: A Little 6 Click Score: 19   End of Session Equipment Utilized During Treatment: Rolling walker (2 wheels) Nurse Communication: Mobility status  Activity Tolerance: Patient tolerated treatment well Patient left: in chair;with call bell/phone within reach  OT Visit Diagnosis: Unsteadiness on feet (R26.81);Other abnormalities of gait and mobility (R26.89);Muscle weakness (generalized) (M62.81)                Time: 8846-8769 OT Time Calculation (min): 37  min Charges:  OT General Charges $OT Visit: 1 Visit OT Evaluation $OT Eval Moderate Complexity: 1 Mod  [name] Kendall, OTR/L Acute Rehabilitation Services Office [phone] Secure Chat Communication Preferred   [name] JONETTA Kendall 03/03/2024, 1:21 PM

## 2024-03-03 NOTE — Inpatient Diabetes Management (Signed)
 Inpatient Diabetes Program Recommendations  AACE/ADA: New Consensus Statement on Inpatient Glycemic Control (2015)  Target Ranges:  Prepandial:   less than 140 mg/dL      Peak postprandial:   less than 180 mg/dL (1-2 hours)      Critically ill patients:  140 - 180 mg/dL   Lab Results  Component Value Date   GLUCAP 296 (H) 03/03/2024   HGBA1C 10.5 (A) 02/12/2024    Review of Glycemic Control  Latest Reference Range & Units 03/02/24 09:29 03/02/24 11:49 03/02/24 16:38 03/02/24 17:32 03/02/24 21:13 03/03/24 06:53  Glucose-Capillary 70 - 99 mg/dL 736 (H) 775 (H) 864 (H) 140 (H) 195 (H) 296 (H)  (H): Data is abnormally high  Diabetes history: Type 1 DM Outpatient Diabetes medications: Lantus  9 units BID, Novolog  10-12 units TID Current orders for Inpatient glycemic control: Lantus  18 units every day, Novolog  0-9 units TID & HS, Novolog  5 units TID  Inpatient Diabetes Program Recommendations:    Fasting glucose 296 mg/dL this morning.  Please consider:  Lantus  22 units QAM (could administer 4 additional units this am as she has already had 18 units this am).    Thank you, Wyvonna Pinal, MSN, CDCES Diabetes Coordinator Inpatient Diabetes Program [phone] (team pager from 8a-5p)

## 2024-03-03 NOTE — Care Management Important Message (Signed)
 Important Message  Patient Details  Name: [name] MRN: [id number] Date of Birth: [birth year]   Important Message Given:  Yes - Medicare IM     Jon Cruel 03/03/2024, 4:04 PM

## 2024-03-04 ENCOUNTER — Telehealth: Payer: Self-pay | Admitting: Lab

## 2024-03-04 ENCOUNTER — Other Ambulatory Visit (HOSPITAL_COMMUNITY): Payer: Self-pay

## 2024-03-04 ENCOUNTER — Other Ambulatory Visit: Payer: Self-pay | Admitting: Podiatry

## 2024-03-04 DIAGNOSIS — M869 Osteomyelitis, unspecified: Secondary | ICD-10-CM | POA: Diagnosis not present

## 2024-03-04 DIAGNOSIS — I10 Essential (primary) hypertension: Secondary | ICD-10-CM

## 2024-03-04 LAB — CULTURE, BLOOD (ROUTINE X 2)
Culture: NO GROWTH
Culture: NO GROWTH

## 2024-03-04 LAB — ACID FAST SMEAR (AFB, MYCOBACTERIA): Acid Fast Smear: NEGATIVE

## 2024-03-04 LAB — GLUCOSE, CAPILLARY
Glucose-Capillary: 215 mg/dL — ABNORMAL HIGH (ref 70–99)
Glucose-Capillary: 254 mg/dL — ABNORMAL HIGH (ref 70–99)

## 2024-03-04 MED ORDER — OXYCODONE-ACETAMINOPHEN 10-325 MG PO TABS: 1.0000 | ORAL_TABLET | Freq: Three times a day (TID) | ORAL | 0 refills | Status: DC | PRN

## 2024-03-04 NOTE — Discharge Instructions (Signed)
 Advised to follow-up with primary care physician in 1 week. Advised weightbearing as tolerated on the left foot. Advised to take clindamycin  3 times daily for 5 days. Advised to follow-up with podiatry as scheduled.

## 2024-03-04 NOTE — Plan of Care (Signed)

## 2024-03-04 NOTE — Telephone Encounter (Signed)
 Patient states has no pain medication no orders on her discharge instructions please advise.

## 2024-03-04 NOTE — Progress Notes (Signed)
 Physical Therapy Treatment Patient Details Name: [name] MRN: [id number] DOB: 07/27/1965 Today's Date: 03/04/2024   History of Present Illness 58 y.o. female presents to [facility] hospital on 02/28/2024 with L great toe infection. Imaging notable for osteomyelitis. Pt underwent L great toe amputation on 03/02/2024. PMH includes DM, anxiety, depression, CABG, diabetic neuropathy, HTN, GERD, HLD.    PT Comments  Pt seen for PT tx with pt agreeable. Pt able to don post op shoe with use of reacher & extra time. Pt ambulates around unit with RW & supervision fade to mod I with gait pattern as noted below, decreased dorsiflexion & heel strike RLE. Educated pt on ability to wear tennis shoe on R foot to even out leg length with LLE post op shoe. Pt toileted without assistance. Pt hopeful for d/c home today. Pt with personal hurry-cane in room, preferred to use walker, reports she has a RW at home.    If plan is discharge home, recommend the following: A little help with walking and/or transfers;A little help with bathing/dressing/bathroom;Assistance with cooking/housework;Assist for transportation;Direct supervision/assist for medications management   Can travel by private vehicle        Equipment Recommendations  None recommended by PT    Recommendations for Other Services       Precautions / Restrictions Precautions Precautions: Fall Restrictions Weight Bearing Restrictions Per Provider Order: Yes LUE Weight Bearing Per Provider Order: Weight bearing as tolerated Other Position/Activity Restrictions: with post op shoe     Mobility  Bed Mobility Overal bed mobility: Modified Independent             General bed mobility comments: supine>sit to exit L side of bed    Transfers   Equipment used: Rolling walker (2 wheels)               General transfer comment: sit>stand from EOB, toilet with grab bar PRN    Ambulation/Gait Ambulation/Gait assistance: Supervision, Modified  independent (Device/Increase time) Gait Distance (Feet):  (>150 ft) Assistive device: Rolling walker (2 wheels) Gait Pattern/deviations: Decreased dorsiflexion - right, Decreased step length - left, Decreased step length - right, Decreased stride length, Step-through pattern Gait velocity: decreased     General Gait Details: decreased BLE heel strike, R more than L, no overt LOB   Stairs             Wheelchair Mobility     Tilt Bed    Modified Rankin (Stroke Patients Only)       Balance Overall balance assessment: Needs assistance Sitting-balance support: Feet supported, No upper extremity supported Sitting balance-Leahy Scale: Good Sitting balance - Comments: dons L post op shoe sitting EOB without LOB   Standing balance support: No upper extremity supported, During functional activity, Reliant on assistive device for balance Standing balance-Leahy Scale: Fair                 High Level Balance Comments: hand hygiene standing at sink without LOB            Communication    Cognition Arousal: Alert Behavior During Therapy: WFL for tasks assessed/performed, Flat affect   PT - Cognitive impairments: Problem solving                       PT - Cognition Comments: slowed processing Following commands: Intact      Cueing Cueing Techniques: Verbal cues, Tactile cues, Visual cues, Gestural cues  Exercises      General Comments  General comments (skin integrity, edema, etc.): continent void; pt able to don post op shoe with use of reacher without assistance, educated pt on use of walker bag to transport items      Pertinent Vitals/Pain Pain Assessment Pain Assessment: Faces Pain Location: L foot Pain Descriptors / Indicators: Sore (itching) Pain Intervention(s): Monitored during session    Home Living                          Prior Function            PT Goals (current goals can now be found in the care plan section) Acute  Rehab PT Goals PT Goal Formulation: With patient Time For Goal Achievement: 03/17/24 Potential to Achieve Goals: Good Progress towards PT goals: Progressing toward goals    Frequency           PT Plan      Co-evaluation              AM-PAC PT 6 Clicks Mobility   Outcome Measure  Help needed turning from your back to your side while in a flat bed without using bedrails?: None Help needed moving from lying on your back to sitting on the side of a flat bed without using bedrails?: None Help needed moving to and from a bed to a chair (including a wheelchair)?: None Help needed standing up from a chair using your arms (e.g., wheelchair or bedside chair)?: None Help needed to walk in hospital room?: None Help needed climbing 3-5 steps with a railing? : A Little 6 Click Score: 23    End of Session Equipment Utilized During Treatment:  (post op shoe) Activity Tolerance: Patient tolerated treatment well Patient left: in chair;with call bell/phone within reach   PT Visit Diagnosis: Unsteadiness on feet (R26.81);Other abnormalities of gait and mobility (R26.89);Muscle weakness (generalized) (M62.81);Difficulty in walking, not elsewhere classified (R26.2);Pain Pain - Right/Left: Left Pain - part of body: Ankle and joints of foot     Time: 9174-9155 PT Time Calculation (min) (ACUTE ONLY): 19 min  Charges:    $Therapeutic Activity: 8-22 mins PT General Charges $$ ACUTE PT VISIT: 1 Visit                     Richerd Pinal, PT, DPT 03/04/24, 8:54 AM   Richerd CHRISTELLA Pinal 03/04/2024, 8:53 AM

## 2024-03-04 NOTE — TOC Transition Note (Signed)
 Transition of Care [facility]) - Discharge Note   Patient Details  Name: [name] MRN: [id number] Date of Birth: Aug 05, 1965  Transition of Care [facility]) CM/SW Contact:  Rosalva Jon Bloch, RN Phone Number: 03/04/2024, 11:44 AM   Clinical Narrative:    Patient will DC to: home Anticipated DC date: 03/04/2024 Family notified: yes Transport by: car     -s/p L great toe amputation on 03/02/2024  Per MD patient ready for DC today. RN, and patient notified of DC. Pt agreeable to home health services per therapy recommendations. Referral made with [facility] and accepted. Post hospital f/u noted on AVS. Pt without RX med concerns. Pt will receive RX med from [facility] pharmacy prior to d/c. Family to provide transportation to home .  RNCM will sign off for now as intervention is no longer needed. Please consult us  again if new needs arise.    Final next level of care: Home w Home Health Services Barriers to Discharge: No Barriers Identified   Patient Goals and CMS Choice     Choice offered to / list presented to : Patient      Discharge Placement                       Discharge Plan and Services Additional resources added to the After Visit Summary for                            [facility] Arranged: PT, OT HH Agency: [facility] Date [facility] Agency Contacted: 03/04/24 Time HH Agency Contacted: 1143 Representative spoke with at [facility] Agency: [place]  Social Drivers of Health (SDOH) Interventions SDOH Screenings   Food Insecurity: No Food Insecurity (02/29/2024)  Housing: Low Risk  (02/29/2024)  Transportation Needs: No Transportation Needs (02/29/2024)  Utilities: Not At Risk (02/29/2024)  Alcohol  Screen: Low Risk  (01/07/2024)  Depression (PHQ2-9): Low Risk  (01/08/2024)  Financial Resource Strain: Low Risk  (01/07/2024)  Physical Activity: Inactive (01/07/2024)  Social Connections: Moderately Isolated (01/07/2024)  Stress: No Stress Concern Present (01/07/2024)  Tobacco Use:  Low Risk  (03/02/2024)     Readmission Risk Interventions    01/09/2022    3:29 PM  Readmission Risk Prevention Plan  Transportation Screening Complete  PCP or Specialist Appt within 5-7 Days Complete  Home Care Screening Complete  Medication Review (RN CM) Referral to Pharmacy

## 2024-03-04 NOTE — Discharge Summary (Signed)
 Physician Discharge Summary  Debbe Crumble FMW:[id number] DOB: [birth year] DOA: 02/28/2024  PCP: Delbert Clam, MD  Admit date: 02/28/2024  Discharge date: 03/04/2024  Admitted From: Home  Disposition:  Home  Recommendations for Outpatient Follow-up:  Follow up with PCP in 1-2 weeks. Please obtain BMP/CBC in one week. Advised weightbearing as tolerated on the left foot. Advised to take clindamycin  3 times daily for 5 days. Advised to follow-up with podiatry as scheduled.  Home Health:None Equipment/Devices:None  Discharge Condition: Stable CODE STATUS:Full code Diet recommendation: Carb Modified   Brief Summary/ Hospital Course: This 58 years old female with PMH significant of  type 1 diabetes, anxiety and depression, CAD S/P CABG, diabetic neuropathy, hypertension, GERD, hyperlipidemia who was having left foot great toe infection for the last 3 weeks and followed up PCP, who started antibiotic therapy however despite compliance Patient noted worsening with color change of the big toe and therefore came in for further management. X-ray of the left foot showed finding of erosions and bony destruction involving the proximal phalanx of the great toe as well as in the distal phalanx of the great toe suspicious for active osteomyelitis.  Podiatry was consulted placed on antibiotics and admitted. MRI showed >osteomyelitis and septic arthritis of the interphalangeal joint of the great toe, Great toe soft tissue wound without evidence of abscess or soft tissue emphysema. Diffuse forefoot muscular atrophy with mild nonspecific T2 hyperintensity within the flexor musculature. ABI bilateral normal S/P left great toe amputation at MPJ level 03/02/24.  Postoperatively patient felt much better.  Podiatry recommended,  patient can be discharged on clindamycin  3 times daily for 5 days.  Patient wants to be discharged home.  Advised outpatient follow-up with podiatry.  Discharge Diagnoses:  Principal  Problem:   Osteomyelitis of great toe of left foot [facility])    Discharge Instructions  Discharge Instructions     Call MD for:  difficulty breathing, headache or visual disturbances   Complete by: As directed    Call MD for:  persistant dizziness or light-headedness   Complete by: As directed    Call MD for:  persistant nausea and vomiting   Complete by: As directed    Diet - low sodium heart healthy   Complete by: As directed    Diet Carb Modified   Complete by: As directed    Diet Carb Modified   Complete by: As directed    Discharge instructions   Complete by: As directed    Keep the dressing intact until seen by podiatry  Please call call MD or return to ER for similar or worsening recurring problem that brought you to hospital or if any fever,nausea/vomiting,abdominal pain, uncontrolled pain, chest pain,  shortness of breath or any other alarming symptoms.  Please follow-up your doctor as instructed in a week time and call the office for appointment.  Please avoid alcohol , smoking, or any other illicit substance and maintain healthy habits including taking your regular medications as prescribed.  You were cared for by a hospitalist during your hospital stay. If you have any questions about your discharge medications or the care you received while you were in the hospital after you are discharged, you can call the unit and ask to speak with the hospitalist on call if the hospitalist that took care of you is not available.  Once you are discharged, your primary care physician will handle any further medical issues. Please note that NO REFILLS for any discharge medications will be authorized once you are  discharged, as it is imperative that you return to your primary care physician (or establish a relationship with a primary care physician if you do not have one) for your aftercare needs so that they can reassess your need for medications and monitor your lab values   Discharge  instructions   Complete by: As directed    Advised to follow-up with primary care physician in 1 week. Advised weightbearing as tolerated on the left foot. Advised to take clindamycin  3 times daily for 5 days. Advised to follow-up with podiatry as scheduled.   Discharge wound care:   Complete by: As directed    Keep dressing intact until seen by podiatry   Discharge wound care:   Complete by: As directed    Follow-up wound dressing.   Increase activity slowly   Complete by: As directed    Increase activity slowly   Complete by: As directed       Allergies as of 03/04/2024       Reactions   Anesthetics, Amide Nausea And Vomiting   Chlorhexidine  Other (See Comments)   03/02/24: Patient states she doesn't have CHG allergy    Penicillins Diarrhea, Nausea And Vomiting, Other (See Comments)   Enkaid [encainide] Nausea And Vomiting   Reglan  [metoclopramide ] Swelling, Other (See Comments)   Dystonia Muscle rigidity Slurred speech Tongue swelling 03/02/24 Patient states it doesn't cause allergy  any more.    Subutex [buprenorphine] Rash        Medication List     TAKE these medications    acetaminophen  500 MG tablet Commonly known as: TYLENOL  Take 1,000 mg by mouth at bedtime as needed for mild pain.   ammonium lactate  12 % cream Commonly known as: AMLACTIN Apply 1 Application topically as needed for dry skin.   aspirin  EC 81 MG tablet Take 1 tablet (81 mg total) by mouth daily.   clindamycin  300 MG capsule Commonly known as: CLEOCIN  Take 1 capsule (300 mg total) by mouth 3 (three) times daily for 4 days.   furosemide  40 MG tablet Commonly known as: LASIX  Take 1 tablet (40 mg total) by mouth daily.   insulin  aspart 100 UNIT/ML injection Commonly known as: NovoLOG  Use up to 50 units/day via insulin  pump.   insulin  glargine 100 UNIT/ML injection Commonly known as: Lantus  Lantus  9 units bid What changed:  how much to take how to take this when to take this    losartan  25 MG tablet Commonly known as: COZAAR  Take 1 tablet (25 mg total) by mouth daily. Hold this medication if your systolic blood pressure ( top number) s less than 100. Please call your doctor is your systolic blood pressure is less than 100   metoprolol  tartrate 25 MG tablet Commonly known as: LOPRESSOR  Take 0.5 tablets (12.5 mg total) by mouth 2 (two) times daily.   Omnipod 5 DexG7G6 Pods Gen 5 Misc 1 each by Does not apply route every 3 (three) days.   polyethylene glycol 17 g packet Commonly known as: MIRALAX  / GLYCOLAX  Take 17 g by mouth 2 (two) times daily. What changed:  when to take this reasons to take this   pregabalin  150 MG capsule Commonly known as: LYRICA  Take 1 capsule (150 mg total) by mouth in the morning, at noon, and at bedtime.   rosuvastatin  20 MG tablet Commonly known as: CRESTOR  Take 1 tablet (20 mg total) by mouth daily.   silver  sulfADIAZINE  1 % cream Commonly known as: SILVADENE  Apply 1 Application topically 2 (two) times  daily.               Discharge Care Instructions  (From admission, onward)           Start     Ordered   03/04/24 0000  Discharge wound care:       Comments: Follow-up wound dressing.   03/04/24 1107   03/03/24 0000  Discharge wound care:       Comments: Keep dressing intact until seen by podiatry   03/03/24 1034            Follow-up Information     Delbert Clam, MD Follow up in 1 week(s).   Specialty: Family Medicine Contact information: [address] [place] [address] [phone]         Malvin Marsa FALCON, DPM Follow up in 3 day(s).   Specialty: Actuary information: [address] [place] [address] [phone]         [facility] Follow up.   Specialty: Home Health Services Why: home health services will be provided by [facility] Contact information: [address] [place]  [address] [phone]                Allergies  Allergen Reactions   Anesthetics, Amide Nausea And Vomiting   Chlorhexidine  Other (See Comments)    03/02/24: Patient states she doesn't have CHG allergy    Penicillins Diarrhea, Nausea And Vomiting and Other (See Comments)   Enkaid [Encainide] Nausea And Vomiting   Reglan  [Metoclopramide ] Swelling and Other (See Comments)    Dystonia Muscle rigidity Slurred speech Tongue swelling  03/02/24 Patient states it doesn't cause allergy  any more.    Subutex [Buprenorphine] Rash    Consultations: Podiatry   Procedures/Studies: DG Foot 2 Views Left Result Date: 03/02/2024 CLINICAL DATA:  Postop. EXAM: LEFT FOOT - 2 VIEW COMPARISON:  02/28/2024. FINDINGS: Interval amputation of the first digit at the metatarsophalangeal joint. Hammertoes. IMPRESSION: Interval amputation of the first digit at the metatarsophalangeal joint. No complicating features. Electronically Signed   By: Newell Eke M.D.   On: 03/02/2024 13:33   MR FOOT LEFT WO CONTRAST Result Date: 03/02/2024 CLINICAL DATA:  Foot swelling, diabetic, osteomyelitis suspected, xray done Swelling and ulceration of the great toe with radiographic findings suspicious for osteomyelitis. EXAM: MRI OF THE LEFT FOOT WITHOUT CONTRAST TECHNIQUE: Multiplanar, multisequence MR imaging of the left forefoot was performed. No intravenous contrast was administered. COMPARISON:  Radiographs 02/28/2024 and 07/08/2018. FINDINGS: Bones/Joint/Cartilage As demonstrated radiographically, there are irregular erosive changes around the interphalangeal joint of the great toe with associated cortical destruction and prominent marrow edema throughout the proximal and distal phalanges, consistent with osteomyelitis and septic arthritis in the appropriate clinical context. There is a small interphalangeal joint effusion. No significant arthropathy or effusion of the 1st metatarsophalangeal joint. The additional digits,  metatarsals and visualized bones of the midfoot appear normal. Ligaments Intact Lisfranc ligament. Intact collateral ligaments of the metatarsophalangeal joints. Muscles and Tendons Diffuse forefoot muscular atrophy with mild nonspecific T2 hyperintensity within the flexor musculature. No tendon tear or significant tenosynovitis demonstrated. Soft tissues Apparent soft tissue ulceration along the medial and dorsal aspect of the interphalangeal joint of the great toe. Surrounding skin thickening and subcutaneous edema without organized fluid collection, foreign body or soft tissue emphysema. Mild nonspecific dorsal subcutaneous edema. IMPRESSION: 1. Findings are consistent with osteomyelitis and septic arthritis of the interphalangeal joint of the great toe in the  appropriate clinical context. 2. Great toe soft tissue wound without evidence of abscess or soft tissue emphysema. 3. Diffuse forefoot muscular atrophy with mild nonspecific T2 hyperintensity within the flexor musculature. Electronically Signed   By: Elsie Perone M.D.   On: 03/02/2024 08:17   VAS US  ABI WITH/WO TBI Result Date: 03/01/2024  LOWER EXTREMITY DOPPLER STUDY Patient Name:  Kaycie Pegues  Date of Exam:   02/29/2024 Medical Rec #: [id number]      Accession #:    [id number] Date of Birth: April 11, 1966     Patient Gender: F Patient Age:   58 years Exam Location:  [facility], [place] Procedure:      VAS US  ABI WITH/WO TBI Referring Phys: DONNICE FEES --------------------------------------------------------------------------------  Indications: Ulceration, and peripheral artery disease. High Risk         Hypertension, hyperlipidemia, Diabetes, coronary artery Factors:          disease.  Limitations: Today's exam was limited due to bandages. Comparison Study: Previous study on 10.24.2023. Performing Technologist: Edilia Elden Appl  Examination Guidelines: A complete evaluation includes at minimum, Doppler waveform signals and systolic blood  pressure reading at the level of bilateral brachial, anterior tibial, and posterior tibial arteries, when vessel segments are accessible. Bilateral testing is considered an integral part of a complete examination. Photoelectric Plethysmograph (PPG) waveforms and toe systolic pressure readings are included as required and additional duplex testing as needed. Limited examinations for reoccurring indications may be performed as noted.  ABI Findings: +---------+------------------+-----+-----------+--------+ Right    Rt Pressure (mmHg)IndexWaveform   Comment  +---------+------------------+-----+-----------+--------+ Brachial 141                    triphasic           +---------+------------------+-----+-----------+--------+ PTA      152               1.08 multiphasic         +---------+------------------+-----+-----------+--------+ DP       149               1.06 triphasic           +---------+------------------+-----+-----------+--------+ Great Toe119               0.84 Normal              +---------+------------------+-----+-----------+--------+ +---------+------------------+-----+-----------+-------+ Left     Lt Pressure (mmHg)IndexWaveform   Comment +---------+------------------+-----+-----------+-------+ Brachial 140                    triphasic          +---------+------------------+-----+-----------+-------+ PTA      142               1.01 triphasic          +---------+------------------+-----+-----------+-------+ DP       134               0.95 multiphasic        +---------+------------------+-----+-----------+-------+ Great Toe170               1.21 Normal             +---------+------------------+-----+-----------+-------+ +-------+-----------+-----------+------------+------------+ ABI/TBIToday's ABIToday's TBIPrevious ABIPrevious TBI +-------+-----------+-----------+------------+------------+ Right  1.08       0.84                                 +-------+-----------+-----------+------------+------------+ Left   1.01  1.21                                +-------+-----------+-----------+------------+------------+  Summary: Right: Resting right ankle-brachial index is within normal range. The right toe-brachial index is normal.  Left: Resting left ankle-brachial index is within normal range. The left toe-brachial index is normal.  *See table(s) above for measurements and observations.  Electronically signed by Gaile New MD on 03/01/2024 at 8:26:17 PM.    Final    DG Foot Complete Left Result Date: 02/28/2024 CLINICAL DATA:  Swelling and ulceration of the great toe, left foot wound infection, diabetes EXAM: LEFT FOOT - COMPLETE 3+ VIEW COMPARISON:  07/08/2018 FINDINGS: Abnormal erosions and bony destructive findings in the head of the proximal phalanx great toe. Mild lucency/erosion along the proximal cortical margin of portions of the distal phalanx of the great toe. Appearance suspicious for active osteomyelitis/septic interphalangeal joint given the clinical context. Dorsal subcutaneous edema in the forefoot. IMPRESSION: 1. Abnormal erosions and bony destructive findings in the head of the proximal phalanx of the great toe and base of the distal phalanx great toe, suspicious for active osteomyelitis/septic interphalangeal joint given the clinical context. 2. Dorsal subcutaneous edema in the forefoot. Electronically Signed   By: Ryan Salvage M.D.   On: 02/28/2024 18:51     Subjective: Seen and examined at bedside.  Overnight events noted. Patient reports feeling much improved and wants to be discharged. Advised partial weightbearing in boot.  Discharge Exam: Vitals:   03/04/24 0500 03/04/24 0757  BP: (!) 139/59 134/60  Pulse: 71 72  Resp: 17 16  Temp: 98.3 F (36.8 C) 97.8 F (36.6 C)  SpO2: 95% 95%   Vitals:   03/03/24 1508 03/03/24 1952 03/04/24 0500 03/04/24 0757  BP: (!) 116/58 128/66 (!) 139/59 134/60   Pulse: 61 65 71 72  Resp: 16 17 17 16   Temp: 97.8 F (36.6 C) 97.9 F (36.6 C) 98.3 F (36.8 C) 97.8 F (36.6 C)  TempSrc: Oral Oral Oral Oral  SpO2: 94% 99% 95% 95%  Weight:      Height:        General: Pt is alert, awake, not in acute distress Cardiovascular: RRR, S1/S2 +, no rubs, no gallops Respiratory: CTA bilaterally, no wheezing, no rhonchi Abdominal: Soft, NT, ND, bowel sounds + Extremities: no edema, no cyanosis, Left foot in boot.    The results of significant diagnostics from this hospitalization (including imaging, microbiology, ancillary and laboratory) are listed below for reference.     Microbiology: Recent Results (from the past 240 hours)  Blood culture (routine x 2)     Status: None   Collection Time: 02/28/24  8:10 PM   Specimen: BLOOD  Result Value Ref Range Status   Specimen Description BLOOD LEFT ANTECUBITAL  Final   Special Requests   Final    BOTTLES DRAWN AEROBIC ONLY Blood Culture results may not be optimal due to an inadequate volume of blood received in culture bottles   Culture   Final    NO GROWTH 5 DAYS Performed at [facility], [address], [place], [place] [address]    Report Status 03/04/2024 FINAL  Final  Blood culture (routine x 2)     Status: None   Collection Time: 02/28/24 10:33 PM   Specimen: BLOOD RIGHT ARM  Result Value Ref Range Status   Specimen Description BLOOD RIGHT ARM  Final  Special Requests   Final    BOTTLES DRAWN AEROBIC AND ANAEROBIC Blood Culture results may not be optimal due to an inadequate volume of blood received in culture bottles   Culture   Final    NO GROWTH 5 DAYS Performed at [facility], [address] [place], [place] [address]    Report Status 03/04/2024 FINAL  Final  Surgical pcr screen     Status: None   Collection Time: 03/02/24  7:35 AM   Specimen: Nasal Mucosa; Nasal Swab  Result Value Ref Range Status   MRSA, PCR NEGATIVE NEGATIVE Final   Staphylococcus aureus  NEGATIVE NEGATIVE Final    Comment: (NOTE) The Xpert SA Assay (FDA approved for NASAL specimens in patients 80 years of age and older), is one component of a comprehensive surveillance program. It is not intended to diagnose infection nor to guide or monitor treatment. Performed at [facility], [address], [place], [place] [address]   Aerobic/Anaerobic Culture w Gram Stain (surgical/deep wound)     Status: None (Preliminary result)   Collection Time: 03/02/24  9:15 AM   Specimen: PATH Soft tissue  Result Value Ref Range Status   Specimen Description TISSUE  Final   Special Requests LEFT GREAT TOE  Final   Gram Stain   Final    MODERATE WBC PRESENT,BOTH PMN AND MONONUCLEAR RARE GRAM POSITIVE COCCI Performed at [facility], [address], [place], [place] [address]    Culture   Final    FEW GROUP B STREP(S.AGALACTIAE)ISOLATED TESTING AGAINST S. AGALACTIAE NOT ROUTINELY PERFORMED DUE TO PREDICTABILITY OF AMP/PEN/VAN SUSCEPTIBILITY. NO ANAEROBES ISOLATED; CULTURE IN PROGRESS FOR 5 DAYS    Report Status PENDING  Incomplete     Labs: BNP (last 3 results) No results for input(s): BNP in the last 8760 hours. Basic Metabolic Panel: Recent Labs  Lab 02/28/24 2010 02/28/24 2017 02/29/24 0705 02/29/24 0706 03/01/24 0459 03/02/24 0711 03/03/24 1109  NA 137 136  --  139 136 136 135  K 4.8 5.2*  --  4.5 4.5 4.7 4.1  CL 104 109  --  106 106 107 105  CO2 17*  --   --  18* 21* 16* 19*  GLUCOSE 153* 159*  --  245* 279* 325* 304*  BUN 18 24*  --  16 23* 26* 23*  CREATININE 1.48* 1.40* 1.41* 1.40* 1.43* 1.56* 1.43*  CALCIUM  9.5  --   --  8.9 9.0 8.8* 9.2  MG  --   --   --   --  1.9  --   --    Liver Function Tests: Recent Labs  Lab 02/28/24 2010  AST 26  ALT 16  ALKPHOS 106  BILITOT 0.9  PROT 7.8  ALBUMIN  3.5   No results for input(s): LIPASE, AMYLASE in the last 168 hours. No results for input(s): AMMONIA in the last 168 hours. CBC: Recent Labs   Lab 02/28/24 2010 02/28/24 2017 02/29/24 0705 03/01/24 0459 03/02/24 0711 03/03/24 1109  WBC 5.9  --  4.4 4.7 4.3 5.4  HGB 13.0 13.3 11.2* 11.4* 11.6* 11.7*  HCT 38.7 39.0 34.8* 35.8* 37.5 36.8  MCV 83.2  --  86.6 87.5 90.4 87.2  PLT 237  --  228 211 191 218   Cardiac Enzymes: No results for input(s): CKTOTAL, CKMB, CKMBINDEX, TROPONINI in the last 168 hours. BNP: Invalid input(s): POCBNP CBG: Recent Labs  Lab 03/03/24 1113 03/03/24 1617 03/03/24 2107 03/04/24 0600 03/04/24  1105  GLUCAP 272* 221* 147* 215* 254*   D-Dimer No results for input(s): DDIMER in the last 72 hours. Hgb A1c No results for input(s): HGBA1C in the last 72 hours. Lipid Profile No results for input(s): CHOL, HDL, LDLCALC, TRIG, CHOLHDL, LDLDIRECT in the last 72 hours. Thyroid  function studies No results for input(s): TSH, T4TOTAL, T3FREE, THYROIDAB in the last 72 hours.  Invalid input(s): FREET3 Anemia work up No results for input(s): VITAMINB12, FOLATE, FERRITIN, TIBC, IRON , RETICCTPCT in the last 72 hours. Urinalysis    Component Value Date/Time   COLORURINE YELLOW 06/21/2023 2106   APPEARANCEUR CLEAR 06/21/2023 2106   LABSPEC 1.017 06/21/2023 2106   PHURINE 6.0 06/21/2023 2106   GLUCOSEU >=500 (A) 06/21/2023 2106   GLUCOSEU >=1000 (A) 06/13/2020 1418   HGBUR SMALL (A) 06/21/2023 2106   BILIRUBINUR NEGATIVE 06/21/2023 2106   BILIRUBINUR negative 05/06/2023 1116   BILIRUBINUR negative 11/13/2018 1102   KETONESUR 80 (A) 06/21/2023 2106   PROTEINUR 100 (A) 06/21/2023 2106   UROBILINOGEN 1.0 05/06/2023 1116   UROBILINOGEN 0.2 06/13/2020 1418   NITRITE NEGATIVE 06/21/2023 2106   LEUKOCYTESUR NEGATIVE 06/21/2023 2106   Sepsis Labs Recent Labs  Lab 02/29/24 0705 03/01/24 0459 03/02/24 0711 03/03/24 1109  WBC 4.4 4.7 4.3 5.4   Microbiology Recent Results (from the past 240 hours)  Blood culture (routine x 2)     Status: None    Collection Time: 02/28/24  8:10 PM   Specimen: BLOOD  Result Value Ref Range Status   Specimen Description BLOOD LEFT ANTECUBITAL  Final   Special Requests   Final    BOTTLES DRAWN AEROBIC ONLY Blood Culture results may not be optimal due to an inadequate volume of blood received in culture bottles   Culture   Final    NO GROWTH 5 DAYS Performed at [facility], [address] [place], [place] [address]    Report Status 03/04/2024 FINAL  Final  Blood culture (routine x 2)     Status: None   Collection Time: 02/28/24 10:33 PM   Specimen: BLOOD RIGHT ARM  Result Value Ref Range Status   Specimen Description BLOOD RIGHT ARM  Final   Special Requests   Final    BOTTLES DRAWN AEROBIC AND ANAEROBIC Blood Culture results may not be optimal due to an inadequate volume of blood received in culture bottles   Culture   Final    NO GROWTH 5 DAYS Performed at [facility], [address], [place], [place] [address]    Report Status 03/04/2024 FINAL  Final  Surgical pcr screen     Status: None   Collection Time: 03/02/24  7:35 AM   Specimen: Nasal Mucosa; Nasal Swab  Result Value Ref Range Status   MRSA, PCR NEGATIVE NEGATIVE Final   Staphylococcus aureus NEGATIVE NEGATIVE Final    Comment: (NOTE) The Xpert SA Assay (FDA approved for NASAL specimens in patients 18 years of age and older), is one component of a comprehensive surveillance program. It is not intended to diagnose infection nor to guide or monitor treatment. Performed at [facility], [address], [place], [place] [address]   Aerobic/Anaerobic Culture w Gram Stain (surgical/deep wound)     Status: None (Preliminary result)   Collection Time: 03/02/24  9:15 AM   Specimen: PATH Soft tissue  Result Value Ref Range Status   Specimen Description TISSUE  Final   Special Requests LEFT GREAT TOE  Final   Gram  Stain   Final    MODERATE WBC PRESENT,BOTH PMN AND MONONUCLEAR RARE GRAM POSITIVE COCCI Performed at  [facility], [address], [place], [place] [address]    Culture   Final    FEW GROUP B STREP(S.AGALACTIAE)ISOLATED TESTING AGAINST S. AGALACTIAE NOT ROUTINELY PERFORMED DUE TO PREDICTABILITY OF AMP/PEN/VAN SUSCEPTIBILITY. NO ANAEROBES ISOLATED; CULTURE IN PROGRESS FOR 5 DAYS    Report Status PENDING  Incomplete     Time coordinating discharge: Over 30 minutes  SIGNED:   Darcel Dawley, MD  [facility] 03/04/2024, 4:37 PM Pager   If 7PM-7AM, please contact night-coverage

## 2024-03-04 NOTE — Progress Notes (Signed)
 Occupational Therapy Treatment Patient Details Name: [name] MRN: [id number] DOB: 1966-06-10 Today's Date: 03/04/2024   History of present illness 58 y.o. female presents to [facility] hospital on 02/28/2024 with L great toe infection. Imaging notable for osteomyelitis. Pt underwent L great toe amputation on 03/02/2024. PMH includes DM, anxiety, depression, CABG, diabetic neuropathy, HTN, GERD, HLD.   OT comments  Pt is making great progress towards their acute OT goals. She continues to require significantly increased time to complete LB ADLs, however she is able to complete without physical assist. She mobilizes well without safety concern. OT to continue to follow acutely to facilitate progress towards established goals. Pt will continue to benefit from [facility].       If plan is discharge home, recommend the following:  A little help with bathing/dressing/bathroom;Assist for transportation   Equipment Recommendations  None recommended by OT       Precautions / Restrictions Precautions Precautions: Fall Restrictions Weight Bearing Restrictions Per Provider Order: Yes LUE Weight Bearing Per Provider Order: Weight bearing as tolerated Other Position/Activity Restrictions: with post op shoe       Mobility Bed Mobility Overal bed mobility: Modified Independent                  Transfers Overall transfer level: Modified independent                       Balance Overall balance assessment: Needs assistance Sitting-balance support: Feet supported, No upper extremity supported Sitting balance-Leahy Scale: Good     Standing balance support: No upper extremity supported, During functional activity, Reliant on assistive device for balance Standing balance-Leahy Scale: Fair                             ADL either performed or assessed with clinical judgement   ADL Overall ADL's : Needs assistance/impaired                     Lower Body Dressing:  Supervision/safety;Sit to/from stand Lower Body Dressing Details (indicate cue type and reason): increased time Toilet Transfer: Supervision/safety;Ambulation;Rolling walker (2 wheels)           Functional mobility during ADLs: Supervision/safety General ADL Comments: continues to need increased time for ADLs, pt prefers RW over cane    Extremity/Trunk Assessment Upper Extremity Assessment Upper Extremity Assessment: Generalized weakness   Lower Extremity Assessment Lower Extremity Assessment: Defer to PT evaluation        Vision   Vision Assessment?: No apparent visual deficits   Perception Perception Perception: Within Functional Limits   Praxis Praxis Praxis: WFL   Communication Communication Communication: Impaired Factors Affecting Communication: Difficulty expressing self   Cognition Arousal: Alert Behavior During Therapy: WFL for tasks assessed/performed, Flat affect Cognition: Cognition impaired     Awareness: Online awareness impaired     Executive functioning impairment (select all impairments): Problem solving, Reasoning OT - Cognition Comments: overall cog is [facility] for basic tasks and orientation. good recall of techquies taught yesterday                 Following commands: Intact        Cueing   Cueing Techniques: Verbal cues, Tactile cues, Visual cues, Gestural cues  Exercises      Shoulder Instructions       General Comments VSS on RA    Pertinent Vitals/ Pain  Pain Assessment Pain Assessment: Faces Faces Pain Scale: Hurts little more Pain Location: L foot Pain Descriptors / Indicators: Sore Pain Intervention(s): Limited activity within patient's tolerance, Monitored during session   Frequency  Min 2X/week        Progress Toward Goals  OT Goals(current goals can now be found in the care plan section)  Progress towards OT goals: Progressing toward goals  Acute Rehab OT Goals Patient Stated Goal: to go home OT  Goal Formulation: With patient Time For Goal Achievement: 03/17/24 Potential to Achieve Goals: Good ADL Goals Pt Will Perform Lower Body Dressing: with modified independence;sit to/from stand   AM-PAC OT 6 Clicks Daily Activity     Outcome Measure   Help from another person eating meals?: None Help from another person taking care of personal grooming?: A Little Help from another person toileting, which includes using toliet, bedpan, or urinal?: A Little Help from another person bathing (including washing, rinsing, drying)?: A Little Help from another person to put on and taking off regular upper body clothing?: A Little Help from another person to put on and taking off regular lower body clothing?: A Little 6 Click Score: 19    End of Session Equipment Utilized During Treatment: Rolling walker (2 wheels)  OT Visit Diagnosis: Unsteadiness on feet (R26.81);Other abnormalities of gait and mobility (R26.89);Muscle weakness (generalized) (M62.81)   Activity Tolerance Patient tolerated treatment well   Patient Left in chair;with call bell/phone within reach   Nurse Communication Mobility status        Time: 1130-1148 OT Time Calculation (min): 18 min  Charges: OT General Charges $OT Visit: 1 Visit OT Treatments $Self Care/Home Management : 8-22 mins  [name], OTR/L Acute Rehabilitation Services Office [phone] Secure Chat Communication Preferred   [name] 03/04/2024, 11:52 AM

## 2024-03-05 ENCOUNTER — Telehealth: Payer: Self-pay | Admitting: Podiatry

## 2024-03-05 ENCOUNTER — Other Ambulatory Visit: Payer: Self-pay | Admitting: Podiatry

## 2024-03-05 ENCOUNTER — Telehealth: Payer: Self-pay | Admitting: *Deleted

## 2024-03-05 MED ORDER — TRAMADOL HCL 50 MG PO TABS: 50.0000 mg | ORAL_TABLET | Freq: Three times a day (TID) | ORAL | 0 refills | Status: AC | PRN

## 2024-03-05 NOTE — Transitions of Care (Post Inpatient/ED Visit) (Signed)
   03/05/2024  Name: Nyrie Sigal MRN: [id number] DOB: 09-08-1965  Today's TOC FU Call Status: Today's TOC FU Call Status:: Unsuccessful Call (1st Attempt) Unsuccessful Call (1st Attempt) Date: 03/05/24  Attempted to reach the patient regarding the most recent Inpatient/ED visit.  Follow Up Plan: Additional outreach attempts will be made to reach the patient to complete the Transitions of Care (Post Inpatient/ED visit) call.   Andrea Dimes RN, BSN [place]  [facility] RN Care Manager [phone]

## 2024-03-05 NOTE — Telephone Encounter (Signed)
 Pt called in stating that the wrong medication was sent to the pharmacy ,Pt wants to take Tramadol  due to the fact that is the pain medication she was receiving in the hospital.Pt would like to continue on tramadol  not Oxycodone .  Please advise thank you.

## 2024-03-06 ENCOUNTER — Telehealth: Payer: Self-pay

## 2024-03-06 NOTE — Transitions of Care (Post Inpatient/ED Visit) (Signed)
   03/06/2024  Name: [name] MRN: [id number] DOB: 1965/07/27  Today's TOC FU Call Status: Today's TOC FU Call Status:: Successful TOC FU Call Completed TOC FU Call Complete Date: 03/06/24 Patient's Name and Date of Birth confirmed.  Transition Care Management Follow-up Telephone Call How have you been since you were released from the hospital?: Better Any questions or concerns?: No  Items Reviewed: Did you receive and understand the discharge instructions provided?: Yes Medications obtained,verified, and reconciled?: Yes (Medications Reviewed) (self reported she had all her medications unable to review each medication as PT arrived and patient declined call back.) Any new allergies since your discharge?: No  Medications Reviewed Today:  declined Medications Reviewed Today   Medications were not reviewed in this encounter     Home Care and Equipment/Supplies: Were Home Health Services Ordered?: Yes Name of Home Health Agency:: [facility] Has Agency set up a time to come to your home?: Yes First Home Health Visit Date: 03/06/24 (PT arrived during my call.)  Placed call to patient who reports they are waiting for PT.  Patient reports that she wants a nurse. Reviewed with patient PT was ordered. Patient is seeing podiatrist on 03/11/2024 for wound care. Encouraged patient to discuss this with her MD.  Reviewed also that patient needs follow up  with PCP in 1 week.  Patient reports she will call and make an appoitment.  During this call PT arrived. I offered to call patient back to complete TOC and she declined.  Encouraged patient to call her MD for any changes in condition.  Alan Ee, RN, BSN, CEN [employer]- Transition of Care Team.  [facility] [phone]

## 2024-03-07 LAB — AEROBIC/ANAEROBIC CULTURE W GRAM STAIN (SURGICAL/DEEP WOUND)

## 2024-03-09 ENCOUNTER — Institutional Professional Consult (permissible substitution) (INDEPENDENT_AMBULATORY_CARE_PROVIDER_SITE_OTHER): Admitting: Physician Assistant

## 2024-03-09 ENCOUNTER — Telehealth (INDEPENDENT_AMBULATORY_CARE_PROVIDER_SITE_OTHER): Payer: Self-pay | Admitting: Physician Assistant

## 2024-03-09 NOTE — Telephone Encounter (Signed)
 Called patient to r/s appt to Dr. Vallery schedule same day/time.  Patient stated she wants to cancel appt.  She recently had foot surgery and can not make it.  She will call us  when she wants to r/s.

## 2024-03-10 ENCOUNTER — Ambulatory Visit (INDEPENDENT_AMBULATORY_CARE_PROVIDER_SITE_OTHER): Admitting: Podiatry

## 2024-03-10 DIAGNOSIS — Z9889 Other specified postprocedural states: Secondary | ICD-10-CM | POA: Diagnosis not present

## 2024-03-10 DIAGNOSIS — M869 Osteomyelitis, unspecified: Secondary | ICD-10-CM | POA: Diagnosis not present

## 2024-03-10 NOTE — Progress Notes (Signed)
  Subjective:  Patient ID: [name], female    DOB: 1965-12-15,  MRN: [id number]  Chief Complaint  Patient presents with   Routine Post Op    POV#1-DOS: 03/02/24- Amputation of left great toe at MPJ level. IDDM A1C 10.5. Wearing surgical shoe and Taking Clindamycin . 0 pain. Patient does not want to look at toe.    DOS: 03/02/24 Procedure: 1. Amputation of left great toe at MPJ level     58 y.o. female seen for post op check.  Patient is approximately 1 week out from left great toe amputation.  Has been wearing postop shoe.  Taking clindamycin .  Left the dressing clean dry and intact as instructed since leaving the hospital.  She denies pain  Review of Systems: Negative except as noted in the HPI. Denies N/V/F/Ch.   Objective:   Constitutional Well developed. Well nourished.  Vascular Foot warm and well perfused. Capillary refill normal to all digits.   No calf pain with palpation  Neurologic Normal speech. Oriented to person, place, and time. Epicritic sensation diminished  Dermatologic Patient site well coapted mild dried blood eschar or evidence of residual infection no significant maceration   Orthopedic: S/p L hallux amp MPJ level   Radiographs: Interval amputation of the first digit at the metatarsophalangeal joint. No complicating features.  Pathology: A. TOE, LEFT GREAT, AMPUTATION:  - Chronic ulceration with underlying acute osteomyelitis.  - Proximal margin with articular surface of bone; negative for acute  osteomyelitis.   Micro:   FEW GROUP B STREP(S.AGALACTIAE)ISOLATED     Assessment:   1. Osteomyelitis of great toe of left foot (HCC)   2. Post-operative state    S/p L hallux amp MPJ level  Plan:  Patient was evaluated and treated and all questions answered.  1 week s/p L hallux amputation at MPJ level -Progressing as expected, appears to be healing without any significant issue at this time continue to monitor  -XR: Expected post op changes -WB  Status: WBAT in post op shoe -Sutures: remain intact 1-2 more weeks. -Medications/ABX: Status post clindamycin  on discharge tramadol  as needed for pain -Dressing: Dressing to remain C/D/I until follow up next week tuesday - F/u Plan:  follow-up in 1 week possible suture removal at that time        Marolyn JULIANNA Honour, DPM [facility] / [facility]

## 2024-03-11 ENCOUNTER — Other Ambulatory Visit

## 2024-03-12 NOTE — Telephone Encounter (Signed)
 Message has been taking care of prior to the forwarding of this message.

## 2024-03-17 ENCOUNTER — Ambulatory Visit (INDEPENDENT_AMBULATORY_CARE_PROVIDER_SITE_OTHER): Admitting: Podiatry

## 2024-03-17 ENCOUNTER — Encounter: Payer: Self-pay | Admitting: Podiatry

## 2024-03-17 DIAGNOSIS — M869 Osteomyelitis, unspecified: Secondary | ICD-10-CM | POA: Diagnosis not present

## 2024-03-17 DIAGNOSIS — Z9889 Other specified postprocedural states: Secondary | ICD-10-CM | POA: Diagnosis not present

## 2024-03-17 NOTE — Progress Notes (Signed)
  Subjective:  Patient ID: [name], female    DOB: [birth year],  MRN: [id number]  Chief Complaint  Patient presents with   Routine Post Op    POV#2-DOS: 03/02/24- Amputation of left great toe at MPJ level. 1 pain. IDDM A1C 10.5. Wearing surgical shoe.    DOS: 03/02/24 Procedure: 1. Amputation of left great toe at MPJ level     58 y.o. female seen for post op check.  Patient is approximately 1 week out from left great toe amputation.  Has been wearing postop shoe.  Taking clindamycin .  Left the dressing clean dry and intact as instructed since leaving the hospital.  She denies pain  Review of Systems: Negative except as noted in the HPI. Denies N/V/F/Ch.   Objective:   Constitutional Well developed. Well nourished.  Vascular Foot warm and well perfused. Capillary refill normal to all digits.   No calf pain with palpation  Neurologic Normal speech. Oriented to person, place, and time. Epicritic sensation diminished  Dermatologic Patient site well coapted mild dried blood eschar or evidence of residual infection no significant maceration, improved from prior   Orthopedic: S/p L hallux amp MPJ level   Radiographs: Interval amputation of the first digit at the metatarsophalangeal joint. No complicating features.  Pathology: A. TOE, LEFT GREAT, AMPUTATION:  - Chronic ulceration with underlying acute osteomyelitis.  - Proximal margin with articular surface of bone; negative for acute  osteomyelitis.   Micro:   FEW GROUP B STREP(S.AGALACTIAE)ISOLATED     Assessment:   1. Osteomyelitis of great toe of left foot (HCC)   2. Post-operative state     S/p L hallux amp MPJ level  Plan:  Patient was evaluated and treated and all questions answered.  2 week s/p L hallux amputation at MPJ level -Progressing as expected, appears to be healing without any significant issue at this time continue to monitor  -Explained to patient does not need gauze dressings at this time however  she does not want to look at her amputation site due to trauma/fear of the appearance.  Needs antibiotic ointment and bandage at this time however she wants it wrapped which I did for her.  I recommended dressing be changed at least once a week if she wants a gauze dressing though I recommend antibiotic ointment and Band-Aids. -Patient requesting referral for home health which I placed today I explained that this may or may not be approved or be accomplished in the meantime I would recommend she follow-up with my dressing change recommendations -XR: Expected post op changes -WB Status: WBAT in post op shoe -Sutures: Removed in total today -Medications/ABX: No indication for antibiotics at this time -Dressing: Applied a Betadine wet-to-dry dressing, recommend daily antibiotic ointment and Band-Aid dressing change or if she wants gauze have it changed every 5 to 7 days with Betadine - F/u Plan:  follow-up in 2 week for amputation site check        [name], DPM [facility] / [facility]

## 2024-03-18 ENCOUNTER — Other Ambulatory Visit

## 2024-03-19 ENCOUNTER — Ambulatory Visit: Admitting: Endocrinology

## 2024-03-23 ENCOUNTER — Encounter: Payer: Self-pay | Admitting: Family Medicine

## 2024-03-23 ENCOUNTER — Ambulatory Visit (HOSPITAL_BASED_OUTPATIENT_CLINIC_OR_DEPARTMENT_OTHER): Admitting: Internal Medicine

## 2024-03-31 ENCOUNTER — Ambulatory Visit (INDEPENDENT_AMBULATORY_CARE_PROVIDER_SITE_OTHER): Admitting: Podiatry

## 2024-03-31 DIAGNOSIS — M869 Osteomyelitis, unspecified: Secondary | ICD-10-CM | POA: Diagnosis not present

## 2024-03-31 DIAGNOSIS — Z9889 Other specified postprocedural states: Secondary | ICD-10-CM

## 2024-03-31 NOTE — Progress Notes (Signed)
  Subjective:  Patient ID: [name], female    DOB: [birth year],  MRN: [id number]  Chief Complaint  Patient presents with   Wound Check    F/U Right great toe amputation at MPJ level. 2 pain. IDDM A1C 10.5. Wearing surgical shoe.    DOS: 03/02/24 Procedure: 1. Amputation of left great toe at MPJ level     58 y.o. female seen for post op check.  Patient is approximately 1 month out from amputation.  She reports she is doing well she is walking with postop shoe she has been keeping the foot dry at this point in time.  Has been continue with dressing changes.  Reports minimal pain  Review of Systems: Negative except as noted in the HPI. Denies N/V/F/Ch.   Objective:   Constitutional Well developed. Well nourished.  Vascular Foot warm and well perfused. Capillary refill normal to all digits.   No calf pain with palpation  Neurologic Normal speech. Oriented to person, place, and time. Epicritic sensation diminished  Dermatologic Amputation site well coapted, appears to be healing well with increased healing at the amputation site with some dry eschar surrounding.  No purulence or open wounds.   Orthopedic: S/p L hallux amp MPJ level   Radiographs: Interval amputation of the first digit at the metatarsophalangeal joint. No complicating features.  Pathology: A. TOE, LEFT GREAT, AMPUTATION:  - Chronic ulceration with underlying acute osteomyelitis.  - Proximal margin with articular surface of bone; negative for acute  osteomyelitis.   Micro:   FEW GROUP B STREP(S.AGALACTIAE)ISOLATED     Assessment:   1. Osteomyelitis of great toe of left foot (HCC)   2. Post-operative state      S/p L hallux amp MPJ level  Plan:  Patient was evaluated and treated and all questions answered.  4 week s/p L hallux amputation at MPJ level -Progressing as expected, nearly fully healed at this time there is some eschar that was debrided around the amputation site no wounds  underlying. -Reinforced that patient is okay to wash the foot with warm soapy water and then dry off and apply Band-Aid dressing as needed. -XR: Expected post op changes -WB Status: Okay to transition back to regular shoes at this time -Sutures: Previously removed I removed a few residual sutures that were remnants -Medications/ABX: No indication for antibiotics at this time -Dressing: Recommend antibiotic ointment and Band-Aid as needed until fully healed - F/u Plan: Follow-up in 6 weeks to ensure no issues going forward        [name], DPM [facility] / [facility]

## 2024-04-02 LAB — FUNGUS CULTURE WITH STAIN

## 2024-04-02 LAB — FUNGUS CULTURE RESULT

## 2024-04-02 LAB — FUNGAL ORGANISM REFLEX

## 2024-04-08 ENCOUNTER — Telehealth: Payer: Self-pay | Admitting: Family Medicine

## 2024-04-08 NOTE — Telephone Encounter (Signed)
 Copied from CRM [id number]. Topic: General - Other >> Apr 07, 2024  1:13 PM [name] wrote: Reason for CRM: Patient needs to speak with Jane,would not give reason, please call [phone]

## 2024-04-13 ENCOUNTER — Other Ambulatory Visit

## 2024-04-13 ENCOUNTER — Encounter: Payer: Self-pay | Admitting: Endocrinology

## 2024-04-13 ENCOUNTER — Ambulatory Visit: Admitting: Endocrinology

## 2024-04-13 VITALS — BP 122/58 | HR 74 | Resp 20 | Ht 61.0 in | Wt 182.8 lb

## 2024-04-13 DIAGNOSIS — E1065 Type 1 diabetes mellitus with hyperglycemia: Secondary | ICD-10-CM

## 2024-04-13 DIAGNOSIS — Z23 Encounter for immunization: Secondary | ICD-10-CM

## 2024-04-13 MED ORDER — INSULIN ASPART 100 UNIT/ML IJ SOLN: INTRAMUSCULAR | 5 refills | Status: DC

## 2024-04-13 MED ORDER — DEXCOM G7 SENSOR MISC: 1.0000 | 0 refills | Status: DC

## 2024-04-13 MED ORDER — DEXCOM G7 SENSOR MISC
1.0000 | 4 refills | Status: AC
Start: 2024-04-13 — End: ?

## 2024-04-13 NOTE — Progress Notes (Signed)
 Outpatient Endocrinology Note [place] Henok Heacock, MD  04/13/24  Patient's Name: [name]    DOB: 01-25-1966    MRN: [id number]                                                    REASON OF VISIT: Follow up for type 1 diabetes mellitus  PCP: Delbert Clam, MD  HISTORY OF PRESENT ILLNESS:   [name] is a 58 y.o. old female with past medical history listed below, is here for follow up for type 1 diabetes mellitus.    Pertinent Diabetes History: Patient was previously seen by Dr. Von and Dr. Dartha.  Patient was diagnosed with type 1 diabetes mellitus in 1977.  Patient had periodic episodes of diabetes ketoacidosis in the past.  Patient used to be on OmniPod Dash in the past was started in January 2021 was discontinued due to cost issue, around early 2023.  OmniPod 5 with Dexcom G6 was started in end of January 2025.  Chronic Diabetes Complications : Retinopathy: yes. Last ophthalmology exam was done on ?, following with ophthalmology regularly ?SABRA  Retinal detachment. Nephropathy: Microalbuminuria, CKD 3A on ACE/ARB /losartan  Peripheral neuropathy: yes, on pregabalin , she has been seeing podiatry as well. Coronary artery disease: yes s/p CABG in 2019.  Stroke: yes She has gastroparesis treated with Zofran  as needed.  Also on Reglan  with good relief of symptoms.  Relevant comorbidities and cardiovascular risk factors: Obesity: yes Body mass index is 34.54 kg/m.  Hypertension: Yes  Hyperlipidemia : Yes, on statin   Current / Home Diabetic regimen includes:  She is taking Lantus  9 units 2 times a day and NovoLog  10 to 12 units with meals 1-3 times a day.  OmniPod 5 with Dexcom G7 using NovoLog  U100 .  Pump setting  Basal rates ( 20.6 units/day) maximum basal rate 2.5 units/h 12 AM : 0.75 units/hr 10 AM: 1.0 10:30 AM: 1.2 4 PM:      0.75  Carb ratio 12 AM: 1:1  Sensitivity 12 AM : 1:35  Target 120  Active insulin  time 4 hours  She does not do actual carb  counting she has been using carb number based on meal size 1-5.  Prior diabetic medications: OmniPod Dash. OmniPod 4 insulin  pump since 07/07/2019, currently not using since early 2023. Basal bolus insulin  regimen.   Glycemic data /pump data:    CONTINUOUS GLUCOSE MONITORING SYSTEM (CGMS) INTERPRETATION:                 OmniPod 5 Pump & Dexcom G7 Sensor Download (Reviewed and summarized below.) Dates: October 7 to April 13, 2024, 14-day   Average total daily insulin :  26 units, Basal: 62%, Food Bolus: 38%.  Automated mode 89%.  CGM use 65%.  GMI on CGM 7.6%     Trends:  Mostly acceptable blood sugar with occasional hyperglycemia postprandially with blood sugar up to 250 range mostly related to not enough meal bolus and sometimes late meal bolus.  No concerning hypoglycemia.     Hypoglycemia: Patient has no hypoglycemic episodes. Patient has hypoglycemia awareness.  Factors modifying glucose control: 1.  Diabetic diet assessment: 3 meals a day.  2.  Staying active or exercising: No formal exercise.  3.  Medication compliance: compliant most of the time.  Interval history  Patient resumed insulin  pump OmniPod 5  with Dexcom G7 after diabetic educator visit.  Mostly acceptable blood sugar with occasional postprandial hyperglycemia.  GMI on CGM 7.6%.  She has no other complaints today.  She wants to have flu shot in the clinic today.  REVIEW OF SYSTEMS As per history of present illness.   PAST MEDICAL HISTORY: Past Medical History:  Diagnosis Date   AKI (acute kidney injury)    Anemia, iron  deficiency    Anxiety and depression 05/18/2015   CAD (coronary artery disease), native coronary artery 01/11/2018   s/p CABG - Non-STEMI with severe three-vessel disease noted July 2019   Complication of anesthesia    Depression    Diabetic gastroparesis (HCC)    Diabetic neuropathy, type I diabetes mellitus (HCC) 05/18/2015   DKA, type 1 (HCC) 03/18/2016   Essential  hypertension    Gastroparesis    GERD (gastroesophageal reflux disease)    HLD (hyperlipidemia)    MDD (major depressive disorder), single episode, severe , no psychosis (HCC)    PONV (postoperative nausea and vomiting)    Tardive dyskinesia    Vitamin B12 deficiency 08/16/2015    PAST SURGICAL HISTORY: Past Surgical History:  Procedure Laterality Date   AMPUTATION TOE Left 03/02/2024   Procedure: AMPUTATION, TOE;  Surgeon: Malvin Marsa FALCON, DPM;  Location: [facility] OR;  Service: Orthopedics/Podiatry;  Laterality: Left;   CARDIAC SURGERY     COLONOSCOPY  09/27/2014   at [facility]   CORONARY ARTERY BYPASS GRAFT N/A 01/14/2018   Procedure: CORONARY ARTERY BYPASS GRAFTING (CABG)X3, RIGHT AND LEFT SAPHENOUS VEIN HARVEST, MAMMARY TAKE DOWN. MAMMARY TO LAD, SVG TO PD, SVG TO DISTAL CIRC.;  Surgeon: Army Dallas NOVAK, MD;  Location: [facility] OR;  Service: Open Heart Surgery;  Laterality: N/A;   ESOPHAGOGASTRODUODENOSCOPY  09/27/2014   at [facility], Dr Gretel Crigler. biospy neg for celiac, neg for H pylori.    EYE SURGERY     gailstones     INTRAMEDULLARY (IM) NAIL INTERTROCHANTERIC Right 05/10/2019   Procedure: INTRAMEDULLARY (IM) NAIL INTERTROCHANTRIC;  Surgeon: Leora Lynwood SAUNDERS, MD;  Location: [facility] ORS;  Service: Orthopedics;  Laterality: Right;   IR FLUORO GUIDE CV LINE RIGHT  02/01/2017   IR FLUORO GUIDE CV LINE RIGHT  03/06/2017   IR FLUORO GUIDE CV LINE RIGHT  03/25/2017   IR GENERIC HISTORICAL  01/24/2016   IR FLUORO GUIDE CV LINE RIGHT 01/24/2016 Darrell K Allred, PA-C [facility]   IR GENERIC HISTORICAL  01/24/2016   IR US  GUIDE VASC ACCESS RIGHT 01/24/2016 Darrell K Allred, PA-C [facility]   IR US  GUIDE VASC ACCESS RIGHT  02/01/2017   IR US  GUIDE VASC ACCESS RIGHT  03/06/2017   IR US  GUIDE VASC ACCESS RIGHT  03/25/2017   IR US  GUIDE VASC ACCESS RIGHT  01/20/2019   IR VENIPUNCTURE 80YRS/OLDER BY MD  01/20/2019   LEFT HEART CATH AND CORONARY ANGIOGRAPHY N/A 01/07/2018   Procedure: LEFT HEART CATH AND  CORONARY ANGIOGRAPHY;  Surgeon: Burnard Debby LABOR, MD;  Location: [facility] INVASIVE CV LAB;  Service: Cardiovascular;  Laterality: N/A;   POSTERIOR VITRECTOMY AND MEMBRANE PEEL-LEFT EYE  09/28/2002   POSTERIOR VITRECTOMY AND MEMBRANE PEEL-RIGHT EYE  03/16/2002   RETINAL DETACHMENT SURGERY     TEE WITHOUT CARDIOVERSION N/A 01/14/2018   Procedure: TRANSESOPHAGEAL ECHOCARDIOGRAM (TEE);  Surgeon: Army Dallas NOVAK, MD;  Location: [facility] OR;  Service: Open Heart Surgery;  Laterality: N/A;    ALLERGIES: Allergies  Allergen Reactions   Anesthetics, Amide Nausea And Vomiting   Chlorhexidine  Other (See  Comments)    03/02/24: Patient states she doesn't have CHG allergy    Penicillins Diarrhea, Nausea And Vomiting and Other (See Comments)   Enkaid [Encainide] Nausea And Vomiting   Reglan  [Metoclopramide ] Swelling and Other (See Comments)    Dystonia Muscle rigidity Slurred speech Tongue swelling  03/02/24 Patient states it doesn't cause allergy  any more.    Subutex [Buprenorphine] Rash    FAMILY HISTORY:  Family History  Problem Relation Age of Onset   Cystic fibrosis Mother    Hypertension Father    Diabetes Brother    Hypertension Maternal Grandmother     SOCIAL HISTORY: Social History   Socioeconomic History   Marital status: Single    Spouse name: Not on file   Number of children: 1   Years of education: Not on file   Highest education level: Not on file  Occupational History   Occupation: Unemployed  Tobacco Use   Smoking status: Never   Smokeless tobacco: Never  Vaping Use   Vaping status: Never Used  Substance and Sexual Activity   Alcohol  use: No   Drug use: Not Currently    Types: Marijuana    Comment: none in 4 years   Sexual activity: Yes    Partners: Male    Birth control/protection: None  Other Topics Concern   Not on file  Social History Narrative   Not on file   Social Drivers of Health   Financial Resource Strain: Low Risk  (01/07/2024)   Overall Financial Resource  Strain (CARDIA)    Difficulty of Paying Living Expenses: Not hard at all  Food Insecurity: No Food Insecurity (02/29/2024)   Hunger Vital Sign    Worried About Running Out of Food in the Last Year: Never true    Ran Out of Food in the Last Year: Never true  Transportation Needs: No Transportation Needs (02/29/2024)   PRAPARE - Administrator, Civil Service (Medical): No    Lack of Transportation (Non-Medical): No  Physical Activity: Inactive (01/07/2024)   Exercise Vital Sign    Days of Exercise per Week: 0 days    Minutes of Exercise per Session: 0 min  Stress: No Stress Concern Present (01/07/2024)   [employer] of Occupational Health - Occupational Stress Questionnaire    Feeling of Stress: Not at all  Social Connections: Moderately Isolated (01/07/2024)   Social Connection and Isolation Panel    Frequency of Communication with Friends and Family: More than three times a week    Frequency of Social Gatherings with Friends and Family: Three times a week    Attends Religious Services: 1 to 4 times per year    Active Member of Clubs or Organizations: No    Attends Banker Meetings: Never    Marital Status: Never married    MEDICATIONS:  Current Outpatient Medications  Medication Sig Dispense Refill   acetaminophen  (TYLENOL ) 500 MG tablet Take 1,000 mg by mouth at bedtime as needed for mild pain.     ammonium lactate  (AMLACTIN) 12 % cream Apply 1 Application topically as needed for dry skin. 385 g 2   aspirin  EC 81 MG tablet Take 1 tablet (81 mg total) by mouth daily.     furosemide  (LASIX ) 40 MG tablet Take 1 tablet (40 mg total) by mouth daily. 90 tablet 3   Insulin  Disposable Pump (OMNIPOD 5 DEXG7G6 PODS GEN 5) MISC 1 each by Does not apply route every 3 (three) days. 15 each 3  insulin  glargine (LANTUS ) 100 UNIT/ML injection Lantus  9 units bid (Patient taking differently: Inject 9 Units into the skin 2 (two) times daily. Lantus  9 units bid as back up to  her insulin  pump per patient) 10 mL 1   losartan  (COZAAR ) 25 MG tablet Take 1 tablet (25 mg total) by mouth daily. Hold this medication if your systolic blood pressure ( top number) s less than 100. Please call your doctor is your systolic blood pressure is less than 100 90 tablet 3   metoprolol  tartrate (LOPRESSOR ) 25 MG tablet Take 0.5 tablets (12.5 mg total) by mouth 2 (two) times daily. 45 tablet 3   polyethylene glycol (MIRALAX  / GLYCOLAX ) 17 g packet Take 17 g by mouth 2 (two) times daily. (Patient taking differently: Take 17 g by mouth daily as needed.) 14 each 0   pregabalin  (LYRICA ) 150 MG capsule Take 1 capsule (150 mg total) by mouth in the morning, at noon, and at bedtime. 270 capsule 3   rosuvastatin  (CRESTOR ) 20 MG tablet Take 1 tablet (20 mg total) by mouth daily. 90 tablet 3   silver  sulfADIAZINE  (SILVADENE ) 1 % cream Apply 1 Application topically 2 (two) times daily.     Continuous Glucose Sensor (DEXCOM G7 SENSOR) MISC 1 Device by Does not apply route continuous. 9 each 4   insulin  aspart (NOVOLOG ) 100 UNIT/ML injection Use up to 50 units/day via insulin  pump. 30 mL 5   No current facility-administered medications for this visit.    PHYSICAL EXAM: Vitals:   04/13/24 1409  BP: (!) 122/58  Pulse: 74  Resp: 20  SpO2: 97%  Weight: 182 lb 12.8 oz (82.9 kg)  Height: 5' 1 (1.549 m)      Body mass index is 34.54 kg/m.  Wt Readings from Last 3 Encounters:  04/13/24 182 lb 12.8 oz (82.9 kg)  03/02/24 182 lb 15.7 oz (83 kg)  02/12/24 183 lb (83 kg)    General: Well developed, well nourished female in no apparent distress.  HEENT: AT/[place], no external lesions.  Eyes: Conjunctiva clear and no icterus. Neck: Neck supple  Lungs: Respirations not labored Neurologic: Alert, oriented, normal speech Extremities / Skin: Dry.    Diabetic Foot Exam - Simple   No data filed    LABS Reviewed Lab Results  Component Value Date   HGBA1C 10.5 (A) 02/12/2024   HGBA1C 8.3 (A)  08/28/2023   HGBA1C 9.8 (A) 05/20/2023   No results found for: FRUCTOSAMINE Lab Results  Component Value Date   CHOL 183 08/09/2021   HDL 57.50 08/09/2021   LDLCALC 105 (H) 08/09/2021   LDLDIRECT 96.0 07/04/2022   TRIG 104.0 08/09/2021   CHOLHDL 3 08/09/2021   Lab Results  Component Value Date   MICRALBCREAT 165 (H) 05/06/2023   MICRALBCREAT 254 (H) 09/04/2016   Lab Results  Component Value Date   CREATININE 1.43 (H) 03/03/2024   Lab Results  Component Value Date   GFR 51.61 (L) 07/04/2022    ASSESSMENT / PLAN  1. Encounter for immunization   2. Uncontrolled type 1 diabetes mellitus with hyperglycemia (HCC)     Diabetes Mellitus type 1, complicated by CAD/nephropathy/neuropathy/retinopathy. - Diabetic status / severity: Uncontrolled.  Worsening.  Lab Results  Component Value Date   HGBA1C 10.5 (A) 02/12/2024    - Hemoglobin A1c goal : <6.5%  - Medications: See below.   OmniPod 5 with Dexcom G7 using NovoLog  U100 :   Pump setting  Basal rates ( 20.6 units/day) maximum basal  rate 2.5 units/h 12 AM : 0.75 units/hr 10 AM: 1.0 10:30 AM: 1.2 4 PM:      0.75  Carb ratio 12 AM: 1:1, advised patient to use carb count from 1 - 5 to 2 - 8 range based on meal size,, discussed in detail advised to give meal bolus 15 minutes before eating preferably.  Sensitivity 12 AM : 1:35  Target 120  Active insulin  time 4 hours es not feel comfortable using G7 by herself. -Sent referral to diabetic educator.  - Home glucose testing: CGM Dexcom G7 and check as needed.  - Discussed/ Gave Hypoglycemia treatment plan.  # Consult : Diabetes educator.    # Annual urine for microalbuminuria/ creatinine ratio,microalbuminuria currently, continue ACE/ARB /losartan .  Will check today. Last  Lab Results  Component Value Date   MICRALBCREAT 165 (H) 05/06/2023    # Foot check nightly / neuropathy, continue pregabalin .  Discussed that improvement of diabetes may help with  improving neuropathic symptoms.  On pregabalin  150 mg 3 times a day.  # Patient has diabetic retinopathy, advised to follow-up with ophthalmology regularly..   - Diet: Make healthy diabetic food choices - Life style / activity / exercise: Discussed.  2. Blood pressure  -  BP Readings from Last 1 Encounters:  04/13/24 (!) 122/58    - Control is in target.  - No change in current plans.  3. Lipid status / Hyperlipidemia - Last  Lab Results  Component Value Date   LDLCALC 105 (H) 08/09/2021   - Continue rosuvastatin  20 mg daily.  Managed by primary care provider /cardiology.  Diagnoses and all orders for this visit:  Encounter for immunization -     Flu vaccine trivalent PF, 6mos and older(Flulaval,Afluria,Fluarix,Fluzone)  Uncontrolled type 1 diabetes mellitus with hyperglycemia (HCC) -     insulin  aspart (NOVOLOG ) 100 UNIT/ML injection; Use up to 50 units/day via insulin  pump. -     Microalbumin / creatinine urine ratio -     Discontinue: Continuous Glucose Sensor (DEXCOM G7 SENSOR) MISC; 1 Device by Does not apply route continuous. -     Continuous Glucose Sensor (DEXCOM G7 SENSOR) MISC; 1 Device by Does not apply route continuous.      DISPOSITION Follow up in clinic in 71-month suggested.   All questions answered and patient verbalized understanding of the plan.  [place]ra[place] Vander Kueker, MD [facility] Endocrinology [facility] [address] [place], [address] [place], [place] [address] Phone # [phone]  At least part of this note was generated using voice recognition software. Inadvertent word errors may have occurred, which were not recognized during the proofreading process.

## 2024-04-14 LAB — MICROALBUMIN / CREATININE URINE RATIO
Creatinine, Urine: 113 mg/dL (ref 20–275)
Microalb Creat Ratio: 116 mg/g{creat} — ABNORMAL HIGH (ref <30)
Microalb, Ur: 13.1 mg/dL

## 2024-04-15 ENCOUNTER — Ambulatory Visit: Payer: Self-pay | Admitting: Endocrinology

## 2024-04-16 LAB — ACID FAST CULTURE WITH REFLEXED SENSITIVITIES (MYCOBACTERIA): Acid Fast Culture: NEGATIVE

## 2024-04-21 ENCOUNTER — Other Ambulatory Visit: Payer: Self-pay | Admitting: Physician Assistant

## 2024-04-21 NOTE — Telephone Encounter (Signed)
 Late entry; 04/20/2024 Call to patient Advised that Slater is not in the office. Patient wanted only to speak with Slater and disconnect call.

## 2024-04-22 ENCOUNTER — Telehealth: Payer: Self-pay

## 2024-04-22 NOTE — Telephone Encounter (Signed)
 Message received that the patient is requesting a call back. I called the patient : [phone] and her voicemail is full, unable to leave a message.

## 2024-04-29 ENCOUNTER — Other Ambulatory Visit: Payer: Self-pay

## 2024-04-29 ENCOUNTER — Telehealth: Payer: Self-pay

## 2024-04-29 DIAGNOSIS — E1065 Type 1 diabetes mellitus with hyperglycemia: Secondary | ICD-10-CM

## 2024-04-29 MED ORDER — INSULIN ASPART 100 UNIT/ML IJ SOLN
INTRAMUSCULAR | 5 refills | Status: AC
Start: 2024-04-29 — End: ?

## 2024-04-29 NOTE — Telephone Encounter (Signed)
 Patient called and was given clarification on the amount of units she should be using via pump. Confirmed with MD 50 units max daily. Patient aware. No further questions. RX sent in for 90 day supply as requested by patient

## 2024-04-30 NOTE — Telephone Encounter (Signed)
 Spoke with patient regarding medication management. No further questions at this time.

## 2024-05-04 ENCOUNTER — Ambulatory Visit

## 2024-05-04 NOTE — Progress Notes (Signed)
 Patient is refusing TF at this time she reports that her balance is good I gave her financial form patient has no ded with 20% coins approx $145-$150 for Toe filler if she decides to proceed   [name] Cped

## 2024-05-12 ENCOUNTER — Other Ambulatory Visit: Payer: Self-pay | Admitting: Podiatry

## 2024-05-12 ENCOUNTER — Ambulatory Visit (INDEPENDENT_AMBULATORY_CARE_PROVIDER_SITE_OTHER): Admitting: Podiatry

## 2024-05-12 ENCOUNTER — Encounter: Payer: Self-pay | Admitting: Podiatry

## 2024-05-12 ENCOUNTER — Other Ambulatory Visit: Payer: Self-pay | Admitting: Endocrinology

## 2024-05-12 DIAGNOSIS — E1040 Type 1 diabetes mellitus with diabetic neuropathy, unspecified: Secondary | ICD-10-CM

## 2024-05-12 DIAGNOSIS — M869 Osteomyelitis, unspecified: Secondary | ICD-10-CM

## 2024-05-12 DIAGNOSIS — Z9889 Other specified postprocedural states: Secondary | ICD-10-CM

## 2024-05-12 MED ORDER — CETAPHIL MOISTURIZING EX LOTN: 1.0000 | TOPICAL_LOTION | CUTANEOUS | 0 refills | Status: AC | PRN

## 2024-05-12 NOTE — Progress Notes (Signed)
  Subjective:  Patient ID: [name], female    DOB: Nov 21, 1965,  MRN: [id number]  Chief Complaint  Patient presents with   Routine Post Op    F/U Right great toe amputation at MPJ level. 0 pain. A1C 7.5.    DOS: 03/02/24 Procedure: 1. Amputation of left great toe at MPJ level     58 y.o. female seen for post op check.  Patient is now 2 months out from surgery.  Has been doing antibiotic ointment and Band-Aid style dressing.  No drainage.  Review of Systems: Negative except as noted in the HPI. Denies N/V/F/Ch.   Objective:   Constitutional Well developed. Well nourished.  Vascular Foot warm and well perfused. Capillary refill normal to all digits.   No calf pain with palpation  Neurologic Normal speech. Oriented to person, place, and time. Epicritic sensation diminished  Dermatologic Amputation site well coapted there is mild hyperkeratotic tissue at the amputation site but overall well-healed    Orthopedic: S/p L hallux amp MPJ level   Radiographs: Interval amputation of the first digit at the metatarsophalangeal joint. No complicating features.  Pathology: A. TOE, LEFT GREAT, AMPUTATION:  - Chronic ulceration with underlying acute osteomyelitis.  - Proximal margin with articular surface of bone; negative for acute  osteomyelitis.   Micro:   FEW GROUP B STREP(S.AGALACTIAE)ISOLATED     Assessment:   1. Osteomyelitis of great toe of left foot (HCC)   2. Post-operative state    S/p L hallux amp MPJ level  Plan:  Patient was evaluated and treated and all questions answered.  8 week s/p L hallux amputation at MPJ level -Progressing as expected, fully healed at this time -No further dressings needed.  She is okay to use lotion on the foot -XR: Expected post op changes -WB Status: Okay to transition back to regular shoes at this time -Sutures: Previously removed   -Medications/ABX: No indication for antibiotics at this time -Dressing: Not needed - F/u Plan:  Follow-up as needed        [name], DPM [facility] / [facility]

## 2024-05-13 ENCOUNTER — Telehealth: Payer: Self-pay | Admitting: Endocrinology

## 2024-05-13 NOTE — Telephone Encounter (Signed)
 MEDICATION: [place]  PHARMACY:  [id number] [place], [place] - [phone] AT [facility] & [address] [place] [address] Phone: [phone]  Fax: [phone]   HAS THE PATIENT CONTACTED THEIR PHARMACY?  Yes  LAST REFILL:  @@LASTREFILL @  IS THIS A 90 DAY SUPPLY : No  IS PATIENT OUT OF MEDICATION: No  IF NOT; HOW MUCH IS LEFT: 5 pills or so left  LAST APPOINTMENT DATE: @11 /18/2025  NEXT APPOINTMENT DATE:@1 /21/2026  DO WE HAVE YOUR PERMISSION TO LEAVE A DETAILED MESSAGE?: Yes  OTHER COMMENTS: Patient is in a lot of pain    **Let patient know to contact pharmacy at the end of the day to make sure medication is ready. **  ** Please notify patient to allow 48-72 hours to process**  **Encourage patient to contact the pharmacy for refills or they can request refills through [facility]**

## 2024-05-13 NOTE — Telephone Encounter (Signed)
 Forwarded to MD.

## 2024-05-29 LAB — COLOGUARD

## 2024-06-01 ENCOUNTER — Ambulatory Visit: Payer: Self-pay

## 2024-06-01 NOTE — Telephone Encounter (Signed)
 FYI Only or Action Required?: FYI only for provider: appointment scheduled on 12.9.25.  Patient was last seen in primary care on 01/29/2024 by Newlin, Enobong, MD.  Called Nurse Triage reporting Cough.  Symptoms began a week ago.  Interventions attempted: OTC medications: mucinex .  Symptoms are: gradually worsening.  Triage Disposition: See Physician Within 24 Hours  Patient/caregiver understands and will follow disposition?: Yes      Copied from CRM [id number]. Topic: Clinical - Red Word Triage >> Jun 01, 2024 11:28 AM [name] wrote: Red Word that prompted transfer to Nurse Triage:  She has cold symptoms are getting worse and it has been going on for 8 to 9 days. Cough, congestion, headache, sugar today is 400 and over the past few days it has been up. Reason for Disposition  [1] Continuous (nonstop) coughing interferes with work or school AND [2] no improvement using cough treatment per Care Advice  Answer Assessment - Initial Assessment Questions Symptoms started last Wednesday. Productive cough, white mucus, no blood. She states she feels like her cough is getting tighter. She does say she has chest tightness with the cough, does have a little bit of shortness of breath at times. Denies any fever. She also has a headache with the cough. Her BS has been 300-400 the last week or so. She states she was told to increase her insulin  and she has and it's still over 300    1. ONSET: When did the cough begin?      About a week ago 2. SEVERITY: How bad is the cough today?      moderate 3. SPUTUM: Describe the color of your sputum (e.g., none, dry cough; clear, white, yellow, green)     white 4. HEMOPTYSIS: Are you coughing up any blood? If Yes, ask: How much? (e.g., flecks, streaks, tablespoons, etc.)     denies 5. DIFFICULTY BREATHING: Are you having difficulty breathing? If Yes, ask: How bad is it? (e.g., mild, moderate, severe)      Mild, intermittently 6. FEVER:  Do you have a fever? If Yes, ask: What is your temperature, how was it measured, and when did it start?     denies 7. CARDIAC HISTORY: Do you have any history of heart disease? (e.g., heart attack, congestive heart failure)      Cad, chf 8. LUNG HISTORY: Do you have any history of lung disease?  (e.g., pulmonary embolus, asthma, emphysema)     no 9. OTHER SYMPTOMS: Do you have any other symptoms? (e.g., runny nose, wheezing, chest pain)       States she has had wheezing but none now.  Protocols used: Cough - Acute Productive-A-AH

## 2024-06-01 NOTE — Telephone Encounter (Signed)
 Noted

## 2024-06-02 ENCOUNTER — Ambulatory Visit: Payer: Self-pay | Admitting: Nurse Practitioner

## 2024-06-02 ENCOUNTER — Encounter: Payer: Self-pay | Admitting: Nurse Practitioner

## 2024-06-02 VITALS — BP 101/60 | HR 69 | Temp 98.0°F | Wt 183.0 lb

## 2024-06-02 DIAGNOSIS — J209 Acute bronchitis, unspecified: Secondary | ICD-10-CM | POA: Insufficient documentation

## 2024-06-02 DIAGNOSIS — E1065 Type 1 diabetes mellitus with hyperglycemia: Secondary | ICD-10-CM | POA: Insufficient documentation

## 2024-06-02 DIAGNOSIS — R051 Acute cough: Secondary | ICD-10-CM | POA: Insufficient documentation

## 2024-06-02 LAB — POC COVID19/FLU A&B COMBO
Covid Antigen, POC: NEGATIVE
Influenza A Antigen, POC: NEGATIVE
Influenza B Antigen, POC: NEGATIVE

## 2024-06-02 LAB — POCT GLYCOSYLATED HEMOGLOBIN (HGB A1C): Hemoglobin A1C: 11.6 % — AB (ref 4.0–5.6)

## 2024-06-02 MED ORDER — INSULIN GLARGINE 100 UNIT/ML ~~LOC~~ SOLN: SUBCUTANEOUS | 1 refills | Status: DC

## 2024-06-02 MED ORDER — AZITHROMYCIN 250 MG PO TABS: ORAL_TABLET | ORAL | 0 refills | Status: AC

## 2024-06-02 MED ORDER — BENZONATATE 100 MG PO CAPS: 100.0000 mg | ORAL_CAPSULE | Freq: Three times a day (TID) | ORAL | 0 refills | Status: AC | PRN

## 2024-06-02 NOTE — Assessment & Plan Note (Signed)
 Lab Results  Component Value Date   HGBA1C 11.6 (A) 06/02/2024    Type 1 diabetes mellitus with hyperglycemia Hyperglycemia due to inability to afford Omnipod insulin  pump, leading to increased use of NovoLog  and Lantus . Currently taking NovoLog  11-14 units in the morning and Lantus  9 units twice a day. - Sent refill for Lantus  9 units twice daily, continue NovoLog  as ordered - Advised to discuss financial assistance options with endocrinologist. - Advised to consult endocrinologist regarding insulin  management. Patient counseled on low-carb diet

## 2024-06-02 NOTE — Progress Notes (Signed)
 Acute Office Visit  Subjective:     Patient ID: [name], female    DOB: 1965-07-04, 59 y.o.   MRN: [id number]  Chief Complaint  Patient presents with   Cough   Headache   Nasal Congestion    Has been taking mucinex  for symptoms, has been a problem since the weekend    GI Problem    Upset since Sunday     Cough Associated symptoms include headaches and rhinorrhea. Pertinent negatives include no chest pain, chills, fever, myalgias, postnasal drip, rash, shortness of breath or wheezing.  Headache  Associated symptoms include coughing and rhinorrhea. Pertinent negatives include no abdominal pain, back pain, fever, nausea, numbness, vomiting or weakness.  GI Problem The primary symptoms include diarrhea. Primary symptoms do not include fever, fatigue, abdominal pain, nausea, vomiting, dysuria, myalgias, arthralgias or rash.  The illness does not include chills, constipation or back pain.   Patient is in today for   Discussed the use of AI scribe software for clinical note transcription with the patient, who gave verbal consent to proceed.  History of Present Illness [name] is a 58 year old female who presents with chest congestion, cough, and upset stomach.  She has been experiencing chest congestion, coughing, runny nose, sneezing, and headaches for the past two days. The cough produces white sputum, which sometimes loosens with tea, honey, and lemon but then tightens again. She denies being around anyone sick with flu or COVID and has received her flu vaccine for the season.  She reports an upset stomach for the past two days, with some diarrhea but no nausea or vomiting. She has a history of gastroparesis but states that her current symptoms have not triggered it.  Regarding her diabetes management, she has been unable to afford the Omnipod and has been taking insulin  shots instead. She is out of Lantus , which she usually takes at nine units twice a day, and has  been using NovoLog  on a sliding scale, taking 11 to 14 units in the morning. Her blood sugar levels have been high.  She is followed by endocrinologist  Assessment & Plan      Review of Systems  Constitutional:  Negative for appetite change, chills, fatigue and fever.  HENT:  Positive for congestion and rhinorrhea. Negative for postnasal drip and sneezing.   Respiratory:  Positive for cough. Negative for shortness of breath and wheezing.   Cardiovascular:  Negative for chest pain, palpitations and leg swelling.  Gastrointestinal:  Positive for diarrhea. Negative for abdominal pain, constipation, nausea and vomiting.  Genitourinary:  Negative for difficulty urinating, dysuria, flank pain and frequency.  Musculoskeletal:  Negative for arthralgias, back pain, joint swelling and myalgias.  Skin:  Negative for color change, pallor, rash and wound.  Neurological:  Positive for headaches. Negative for facial asymmetry, weakness and numbness.  Psychiatric/Behavioral:  Negative for behavioral problems, confusion, self-injury and suicidal ideas.         Objective:    BP 101/60 (BP Location: Left Arm, Patient Position: Sitting, Cuff Size: Large)   Pulse 69   Wt 183 lb (83 kg)   SpO2 100%   BMI 34.58 kg/m    Physical Exam Vitals and nursing note reviewed.  Constitutional:      General: She is not in acute distress.    Appearance: Normal appearance. She is obese. She is not ill-appearing, toxic-appearing or diaphoretic.  HENT:     Nose: Congestion and rhinorrhea present.     Mouth/Throat:  Mouth: Mucous membranes are moist.     Pharynx: Oropharynx is clear. No oropharyngeal exudate or posterior oropharyngeal erythema.  Eyes:     General: No scleral icterus.       Right eye: No discharge.        Left eye: No discharge.     Extraocular Movements: Extraocular movements intact.     Conjunctiva/sclera: Conjunctivae normal.  Cardiovascular:     Rate and Rhythm: Normal rate and  regular rhythm.     Pulses: Normal pulses.     Heart sounds: Normal heart sounds. No murmur heard.    No friction rub. No gallop.  Pulmonary:     Effort: Pulmonary effort is normal. No respiratory distress.     Breath sounds: No stridor. Rhonchi present. No wheezing or rales.  Chest:     Chest wall: No tenderness.  Abdominal:     General: There is no distension.     Palpations: Abdomen is soft.     Tenderness: There is no abdominal tenderness. There is no right CVA tenderness, left CVA tenderness or guarding.  Musculoskeletal:        General: No swelling, tenderness, deformity or signs of injury.     Right lower leg: No edema.     Left lower leg: No edema.  Skin:    General: Skin is warm and dry.     Capillary Refill: Capillary refill takes less than 2 seconds.     Coloration: Skin is not jaundiced or pale.     Findings: No bruising, erythema or lesion.  Neurological:     Mental Status: She is alert and oriented to person, place, and time.  Psychiatric:        Mood and Affect: Mood normal.        Behavior: Behavior normal.        Thought Content: Thought content normal.        Judgment: Judgment normal.     Results for orders placed or performed in visit on 06/02/24  HgB A1c  Result Value Ref Range   Hemoglobin A1C 11.6 (A) 4.0 - 5.6 %   HbA1c POC (<> result, manual entry)     HbA1c, POC (prediabetic range)     HbA1c, POC (controlled diabetic range)    POC Covid19/Flu A&B Antigen  Result Value Ref Range   Influenza A Antigen, POC Negative Negative   Influenza B Antigen, POC Negative Negative   Covid Antigen, POC Negative Negative        Assessment & Plan:   Problem List Items Addressed This Visit       Respiratory   Acute bronchitis   Symptoms include chest congestion, cough with white sputum, rhinorrhea, sneezing, and headaches for two days.  results of COVID-19 and influenza tests were negative azithromycin  and Tessalon  pearls prescribed Advised Tylenol  650  mg every 6 hours as needed for headache, fever Follow-up with PCP if no improvement after completing antibiotics       Relevant Medications   azithromycin  (ZITHROMAX ) 250 MG tablet   benzonatate  (TESSALON ) 100 MG capsule     Endocrine   Uncontrolled type 1 diabetes mellitus with hyperglycemia (HCC) - Primary   Lab Results  Component Value Date   HGBA1C 11.6 (A) 06/02/2024    Type 1 diabetes mellitus with hyperglycemia Hyperglycemia due to inability to afford Omnipod insulin  pump, leading to increased use of NovoLog  and Lantus . Currently taking NovoLog  11-14 units in the morning and Lantus  9 units twice a day. -  Sent refill for Lantus  9 units twice daily, continue NovoLog  as ordered - Advised to discuss financial assistance options with endocrinologist. - Advised to consult endocrinologist regarding insulin  management. Patient counseled on low-carb diet      Relevant Medications   insulin  glargine (LANTUS ) 100 UNIT/ML injection   Other Relevant Orders   HgB A1c (Completed)     Other   Acute cough   Relevant Orders   POC Covid19/Flu A&B Antigen (Completed)    Meds ordered this encounter  Medications   insulin  glargine (LANTUS ) 100 UNIT/ML injection    Sig: Lantus  9 units  twice daily    Dispense:  10 mL    Refill:  1   azithromycin  (ZITHROMAX ) 250 MG tablet    Sig: Take 2 tablets on day 1, then 1 tablet daily on days 2 through 5    Dispense:  6 tablet    Refill:  0   benzonatate  (TESSALON ) 100 MG capsule    Sig: Take 1 capsule (100 mg total) by mouth 3 (three) times daily as needed for cough.    Dispense:  20 capsule    Refill:  0    No follow-ups on file.  Pinkey Mcjunkin R Akisha Sturgill, FNP

## 2024-06-02 NOTE — Assessment & Plan Note (Signed)
 Symptoms include chest congestion, cough with white sputum, rhinorrhea, sneezing, and headaches for two days.  results of COVID-19 and influenza tests were negative azithromycin  and Tessalon  pearls prescribed Advised Tylenol  650 mg every 6 hours as needed for headache, fever Follow-up with PCP if no improvement after completing antibiotics

## 2024-06-02 NOTE — Patient Instructions (Signed)
 1. Uncontrolled type 1 diabetes mellitus with hyperglycemia (HCC) (Primary)  - HgB A1c - insulin  glargine (LANTUS ) 100 UNIT/ML injection; Lantus  9 units  twice daily  Dispense: 10 mL; Refill: 1  2. Acute cough  - POC Covid19/Flu A&B Antigen  3. Acute bronchitis, unspecified organism  - azithromycin  (ZITHROMAX ) 250 MG tablet; Take 2 tablets on day 1, then 1 tablet daily on days 2 through 5  Dispense: 6 tablet; Refill: 0 - benzonatate  (TESSALON ) 100 MG capsule; Take 1 capsule (100 mg total) by mouth 3 (three) times daily as needed for cough.  Dispense: 20 capsule; Refill: 0  Please take Tylenol  650 mg every 6 hours as needed for headaches   It is important that you exercise regularly at least 30 minutes 5 times a week as tolerated  Think about what you will eat, plan ahead. Choose  clean, green, fresh or frozen over canned, processed or packaged foods which are more sugary, salty and fatty. 70 to 75% of food eaten should be vegetables and fruit. Three meals at set times with snacks allowed between meals, but they must be fruit or vegetables. Aim to eat over a 12 hour period , example 7 am to 7 pm, and STOP after  your last meal of the day. Drink water,generally about 64 ounces per day, no other drink is as healthy. Fruit juice is best enjoyed in a healthy way, by EATING the fruit.  Thanks for choosing [facility] we consider it a privelige to serve you.

## 2024-06-08 ENCOUNTER — Telehealth: Payer: Self-pay

## 2024-06-08 ENCOUNTER — Other Ambulatory Visit: Payer: Self-pay | Admitting: Family Medicine

## 2024-06-08 MED ORDER — FLUCONAZOLE 150 MG PO TABS: 150.0000 mg | ORAL_TABLET | Freq: Once | ORAL | 0 refills | Status: DC

## 2024-06-08 MED ORDER — FLUCONAZOLE 150 MG PO TABS: 150.0000 mg | ORAL_TABLET | Freq: Once | ORAL | 0 refills | Status: AC

## 2024-06-08 NOTE — Telephone Encounter (Signed)
 Patient has been informed.

## 2024-06-08 NOTE — Telephone Encounter (Signed)
Pt requesting Diflucan pill. ?

## 2024-06-08 NOTE — Telephone Encounter (Signed)
 Copied from CRM [id number]. Topic: Clinical - Medication Question >> Jun 08, 2024 10:47 AM Avram MATSU wrote: Reason for CRM: Patient saw provider for a cold and cough got antibiotics and stated it gave her a yeast infection. She has no other symptoms beside itching and would like a pill (fluconazole ) please advise [phone].  [id number] [address], [place] - [phone] AT [facility] [facility] [address] [place] [address] Phone: [phone] Fax: [phone]

## 2024-06-08 NOTE — Telephone Encounter (Signed)
 Prescription for Diflucan  has been sent to her pharmacy.

## 2024-06-12 ENCOUNTER — Other Ambulatory Visit: Payer: Self-pay | Admitting: Family Medicine

## 2024-06-12 DIAGNOSIS — Z1159 Encounter for screening for other viral diseases: Secondary | ICD-10-CM

## 2024-06-24 LAB — COLOGUARD: COLOGUARD: NEGATIVE

## 2024-07-15 ENCOUNTER — Telehealth: Payer: Self-pay

## 2024-07-15 ENCOUNTER — Encounter: Payer: Self-pay | Admitting: Endocrinology

## 2024-07-15 ENCOUNTER — Ambulatory Visit: Admitting: Endocrinology

## 2024-07-15 VITALS — BP 134/66 | HR 74 | Resp 16 | Ht 61.0 in | Wt 185.6 lb

## 2024-07-15 DIAGNOSIS — E1065 Type 1 diabetes mellitus with hyperglycemia: Secondary | ICD-10-CM | POA: Diagnosis not present

## 2024-07-15 DIAGNOSIS — N1832 Chronic kidney disease, stage 3b: Secondary | ICD-10-CM | POA: Diagnosis not present

## 2024-07-15 MED ORDER — INSULIN PEN NEEDLE 32G X 4 MM MISC: 3 refills | Status: AC

## 2024-07-15 MED ORDER — LANTUS SOLOSTAR 100 UNIT/ML ~~LOC~~ SOPN: 9.0000 [IU] | PEN_INJECTOR | Freq: Two times a day (BID) | SUBCUTANEOUS | 3 refills | Status: AC

## 2024-07-15 MED ORDER — NOVOLOG FLEXPEN 100 UNIT/ML ~~LOC~~ SOPN: PEN_INJECTOR | SUBCUTANEOUS | 4 refills | Status: AC

## 2024-07-15 MED ORDER — INSULIN GLARGINE 100 UNIT/ML ~~LOC~~ SOLN: SUBCUTANEOUS | 4 refills | Status: AC

## 2024-07-15 NOTE — Telephone Encounter (Signed)
 PA needed for omnipod

## 2024-07-15 NOTE — Patient Instructions (Signed)
" °  Lantus  9 units two times a day.  Novolog  5 - 10 units with meals 3 times a day, plus sliding scale as follows. 5 units for small meal and up to 10 units for larger meal.  Sliding Scale Blood Glucose        Insulin  60-150                     None 151-200                   None 201-250                   1 units 251-300                   2 units 301-350                   3 units 351-400                   4 units      >400                        5 units and call provider    "

## 2024-07-15 NOTE — Progress Notes (Unsigned)
 "  Outpatient Endocrinology Note Giovanna Kemmerer, MD  07/16/24  Patient's Name: [name]    DOB: 1966/05/22    MRN: [id number]                                                    REASON OF VISIT: Follow up for type 1 diabetes mellitus  PCP: Delbert Clam, MD  HISTORY OF PRESENT ILLNESS:   Stehanie Ekstrom is a 59 y.o. old female with past medical history listed below, is here for follow up for type 1 diabetes mellitus.    Pertinent Diabetes History: Patient was previously seen by Dr. Von and Dr. Dartha.  Patient was diagnosed with type 1 diabetes mellitus in 1977.  Patient had periodic episodes of diabetes ketoacidosis in the past.  Patient used to be on OmniPod Dash in the past was started in January 2021 was discontinued due to cost issue, around early 2023.  OmniPod 5 with Dexcom G6 was started in end of January 2025.  Chronic Diabetes Complications : Retinopathy: yes. Last ophthalmology exam was done on ?, following with ophthalmology regularly ?SABRA  Retinal detachment. Nephropathy: Microalbuminuria, CKD 3A on ACE/ARB /losartan  Peripheral neuropathy: yes, on pregabalin , she has been seeing podiatry as well. Coronary artery disease: yes s/p CABG in 2019.  Stroke: yes She has gastroparesis treated with Zofran  as needed.  Also on Reglan  with good relief of symptoms.  Relevant comorbidities and cardiovascular risk factors: Obesity: yes Body mass index is 35.07 kg/m.  Hypertension: Yes  Hyperlipidemia : Yes, on statin   Current / Home Diabetic regimen includes:  OmniPod 5 with Dexcom G7 using NovoLog  U100 .  Pump setting  Basal rates ( 20.6 units/day) maximum basal rate 2.5 units/h 12 AM : 0.75 units/hr 10 AM: 1.0 10:30 AM: 1.2 4 PM:      0.75  Carb ratio 12 AM: 1:1  Sensitivity 12 AM : 1:35  Target 120  Active insulin  time 4 hours  She does not do actual carb counting she has been using carb number based on meal size 1-5.  Prior diabetic  medications:  She was Lantus  9 units 2 times a day and NovoLog  10 to 12 units with meals 1-3 times a day.  OmniPod Dash. OmniPod 4 insulin  pump since 07/07/2019, currently not using since early 2023. Basal bolus insulin  regimen.   Glycemic data /pump data:    CONTINUOUS GLUCOSE MONITORING SYSTEM (CGMS) INTERPRETATION:                 OmniPod 5 Pump & Dexcom G7 Sensor Download (Reviewed and summarized below.) Dates: January 8 to July 15, 2024, 14-day   Average total daily insulin :  26 units, Basal: 60%, Food Bolus: 40%.  Automated mode 89%.  CGM use 65%.  GMI on CGM %        Trends:  Mostly acceptable blood sugar.  Occasional hyperglycemia with blood sugar 250-300 range usually related to late meal bolus and sometimes not enough meal bolus and high carb meals.  No concerning hypoglycemia.    Hypoglycemia: Patient has no hypoglycemic episodes. Patient has hypoglycemia awareness.  Factors modifying glucose control: 1.  Diabetic diet assessment: 3 meals a day.  2.  Staying active or exercising: No formal exercise.  3.  Medication compliance: compliant most of the time.  Interval history  OmniPod 5  and Dexcom G7 data as reviewed and noted above.  Hemoglobin A1c was 11.6% in December.  Patient reports OmniPod 5 is going to be expensive for her will be 400+ dollars per month.  She wants to switch back to multidose insulin  regimen.  She is asking for if there is any financial assistance.  I am not aware of out financial assistance from OmniPod at this time.  Provided phone number asked patient to call OmniPod.  No other complaints today.  REVIEW OF SYSTEMS As per history of present illness.   PAST MEDICAL HISTORY: Past Medical History:  Diagnosis Date   AKI (acute kidney injury)    Anemia, iron  deficiency    Anxiety and depression 05/18/2015   CAD (coronary artery disease), native coronary artery 01/11/2018   s/p CABG - Non-STEMI with severe three-vessel disease noted  July 2019   Complication of anesthesia    Depression    Diabetic gastroparesis (HCC)    Diabetic neuropathy, type I diabetes mellitus (HCC) 05/18/2015   DKA, type 1 (HCC) 03/18/2016   Essential hypertension    Gastroparesis    GERD (gastroesophageal reflux disease)    HLD (hyperlipidemia)    MDD (major depressive disorder), single episode, severe , no psychosis (HCC)    PONV (postoperative nausea and vomiting)    Tardive dyskinesia    Vitamin B12 deficiency 08/16/2015    PAST SURGICAL HISTORY: Past Surgical History:  Procedure Laterality Date   AMPUTATION TOE Left 03/02/2024   Procedure: AMPUTATION, TOE;  Surgeon: Malvin Marsa FALCON, DPM;  Location: [facility] OR;  Service: Orthopedics/Podiatry;  Laterality: Left;   CARDIAC SURGERY     COLONOSCOPY  09/27/2014   at [facility]   CORONARY ARTERY BYPASS GRAFT N/A 01/14/2018   Procedure: CORONARY ARTERY BYPASS GRAFTING (CABG)X3, RIGHT AND LEFT SAPHENOUS VEIN HARVEST, MAMMARY TAKE DOWN. MAMMARY TO LAD, SVG TO PD, SVG TO DISTAL CIRC.;  Surgeon: Army Dallas NOVAK, MD;  Location: [facility] OR;  Service: Open Heart Surgery;  Laterality: N/A;   ESOPHAGOGASTRODUODENOSCOPY  09/27/2014   at [facility], Dr Gretel Crigler. biospy neg for celiac, neg for H pylori.    EYE SURGERY     gailstones     INTRAMEDULLARY (IM) NAIL INTERTROCHANTERIC Right 05/10/2019   Procedure: INTRAMEDULLARY (IM) NAIL INTERTROCHANTRIC;  Surgeon: Leora Lynwood SAUNDERS, MD;  Location: [facility] ORS;  Service: Orthopedics;  Laterality: Right;   IR FLUORO GUIDE CV LINE RIGHT  02/01/2017   IR FLUORO GUIDE CV LINE RIGHT  03/06/2017   IR FLUORO GUIDE CV LINE RIGHT  03/25/2017   IR GENERIC HISTORICAL  01/24/2016   IR FLUORO GUIDE CV LINE RIGHT 01/24/2016 Darrell K Allred, PA-C [facility]   IR GENERIC HISTORICAL  01/24/2016   IR US  GUIDE VASC ACCESS RIGHT 01/24/2016 Darrell K Allred, PA-C [facility]   IR US  GUIDE VASC ACCESS RIGHT  02/01/2017   IR US  GUIDE VASC ACCESS RIGHT  03/06/2017   IR US  GUIDE VASC ACCESS RIGHT   03/25/2017   IR US  GUIDE VASC ACCESS RIGHT  01/20/2019   IR VENIPUNCTURE 72YRS/OLDER BY MD  01/20/2019   LEFT HEART CATH AND CORONARY ANGIOGRAPHY N/A 01/07/2018   Procedure: LEFT HEART CATH AND CORONARY ANGIOGRAPHY;  Surgeon: Burnard Debby LABOR, MD;  Location: [facility] INVAS[facility] CV [facility];  Service: Cardiovascular;  Laterality: N/A;   POSTERIOR VITRECTOMY AND MEMBRANE PEEL-LEFT EYE  09/28/2002   POSTERIOR VITRECTOMY AND MEMBRANE PEEL-RIGHT EYE  03/16/2002   RETINAL DETACHMENT SURGERY     TEE WITHOUT CARDIOVERSION N/A 01/14/2018  Procedure: TRANSESOPHAGEAL ECHOCARDIOGRAM (TEE);  Surgeon: Army Dallas NOVAK, MD;  Location: [facility] OR;  Service: Open Heart Surgery;  Laterality: N/A;    ALLERGIES: Allergies  Allergen Reactions   Anesthetics, Amide Nausea And Vomiting   Chlorhexidine  Other (See Comments)    03/02/24: Patient states she doesn't have CHG allergy    Penicillins Diarrhea, Nausea And Vomiting and Other (See Comments)   Enkaid [Encainide] Nausea And Vomiting   Reglan  [Metoclopramide ] Swelling and Other (See Comments)    Dystonia Muscle rigidity Slurred speech Tongue swelling  03/02/24 Patient states it doesn't cause allergy  any more.    Subutex [Buprenorphine] Rash    FAMILY HISTORY:  Family History  Problem Relation Age of Onset   Cystic fibrosis Mother    Hypertension Father    Diabetes Brother    Hypertension Maternal Grandmother     SOCIAL HISTORY: Social History   Socioeconomic History   Marital status: Single    Spouse name: Not on file   Number of children: 1   Years of education: Not on file   Highest education level: Not on file  Occupational History   Occupation: Unemployed  Tobacco Use   Smoking status: Never   Smokeless tobacco: Never  Vaping Use   Vaping status: Never Used  Substance and Sexual Activity   Alcohol  use: No   Drug use: Not Currently    Types: Marijuana    Comment: none in 4 years   Sexual activity: Yes    Partners: Male    Birth control/protection:  None  Other Topics Concern   Not on file  Social History Narrative   Not on file   Social Drivers of Health   Tobacco Use: Low Risk (07/15/2024)   Patient History    Smoking Tobacco Use: Never    Smokeless Tobacco Use: Never    Passive Exposure: Not on file  Financial Resource Strain: Low Risk (01/07/2024)   Overall Financial Resource Strain (CARDIA)    Difficulty of Paying Living Expenses: Not hard at all  Food Insecurity: No Food Insecurity (02/29/2024)   Epic    Worried About Radiation Protection Practitioner of Food in the Last Year: Never true    Ran Out of Food in the Last Year: Never true  Transportation Needs: No Transportation Needs (02/29/2024)   Epic    Lack of Transportation (Medical): No    Lack of Transportation (Non-Medical): No  Physical Activity: Inactive (01/07/2024)   Exercise Vital Sign    Days of Exercise per Week: 0 days    Minutes of Exercise per Session: 0 min  Stress: No Stress Concern Present (01/07/2024)   [employer] of Occupational Health - Occupational Stress Questionnaire    Feeling of Stress: Not at all  Social Connections: Moderately Isolated (01/07/2024)   Social Connection and Isolation Panel    Frequency of Communication with Friends and Family: More than three times a week    Frequency of Social Gatherings with Friends and Family: Three times a week    Attends Religious Services: 1 to 4 times per year    Active Member of Clubs or Organizations: No    Attends Banker Meetings: Never    Marital Status: Never married  Depression (PHQ2-9): Low Risk (06/02/2024)   Depression (PHQ2-9)    PHQ-2 Score: 0  Alcohol  Screen: Low Risk (01/07/2024)   Alcohol  Screen    Last Alcohol  Screening Score (AUDIT): 0  Housing: Low Risk (02/29/2024)   Epic    Unable to Pay for  Housing in the Last Year: No    Number of Times Moved in the Last Year: 0    Homeless in the Last Year: No  Utilities: Not At Risk (02/29/2024)   Epic    Threatened with loss of utilities: No   Health Literacy: Not on file    MEDICATIONS:  Current Outpatient Medications  Medication Sig Dispense Refill   acetaminophen  (TYLENOL ) 500 MG tablet Take 1,000 mg by mouth at bedtime as needed for mild pain.     ammonium lactate  (LAC-HYDRIN ) 12 % lotion APPLY TOPICALLY TO THE AFFECTED AREA AS NEEDED DRY SKIN 400 g 5   aspirin  EC 81 MG tablet Take 1 tablet (81 mg total) by mouth daily.     benzonatate  (TESSALON ) 100 MG capsule Take 1 capsule (100 mg total) by mouth 3 (three) times daily as needed for cough. 20 capsule 0   cetaphil (CETAPHIL) lotion Apply 1 Application topically as needed for dry skin. 236 mL 0   Continuous Glucose Sensor (DEXCOM G7 SENSOR) MISC 1 Device by Does not apply route continuous. 9 each 4   furosemide  (LASIX ) 40 MG tablet Take 1 tablet (40 mg total) by mouth daily. 90 tablet 3   insulin  aspart (NOVOLOG  FLEXPEN) 100 UNIT/ML FlexPen Take 5 - 10 units with meals 3 times a day, plus sliding scale, maximum 40 units/ day. 15 mL 4   insulin  aspart (NOVOLOG ) 100 UNIT/ML injection Use up to 50 units/day via insulin  pump. 45 mL 5   Insulin  Disposable Pump (OMNIPOD 5 DEXG7G6 PODS GEN 5) MISC 1 each by Does not apply route every 3 (three) days. 15 each 3   insulin  glargine (LANTUS  SOLOSTAR) 100 UNIT/ML Solostar Pen Inject 9 Units into the skin 2 (two) times daily. 15 mL 3   Insulin  Pen Needle 32G X 4 MM MISC Use 5x a day 300 each 3   losartan  (COZAAR ) 25 MG tablet Take 1 tablet (25 mg total) by mouth daily. Hold this medication if your systolic blood pressure ( top number) s less than 100. Please call your doctor is your systolic blood pressure is less than 100 90 tablet 3   metoprolol  tartrate (LOPRESSOR ) 25 MG tablet Take 0.5 tablets (12.5 mg total) by mouth 2 (two) times daily. 45 tablet 3   polyethylene glycol (MIRALAX  / GLYCOLAX ) 17 g packet Take 17 g by mouth 2 (two) times daily. (Patient taking differently: Take 17 g by mouth daily as needed.) 14 each 0   pregabalin   (LYRICA ) 150 MG capsule TAKE 1 CAPSULE BY MOUTH EVERY MORNING, 1 CAPSULE DAILY AT NOON, AND 1 CAPSULE AT BEDTIME 270 capsule 3   rosuvastatin  (CRESTOR ) 20 MG tablet Take 1 tablet (20 mg total) by mouth daily. 90 tablet 3   silver  sulfADIAZINE  (SILVADENE ) 1 % cream Apply 1 Application topically 2 (two) times daily.     insulin  glargine (LANTUS ) 100 UNIT/ML injection Lantus  9 units  twice daily 10 mL 4   No current facility-administered medications for this visit.    PHYSICAL EXAM: Vitals:   07/15/24 1400  BP: 134/66  Pulse: 74  Resp: 16  SpO2: 97%  Weight: 185 lb 9.6 oz (84.2 kg)  Height: 5' 1 (1.549 m)      Body mass index is 35.07 kg/m.  Wt Readings from Last 3 Encounters:  07/15/24 185 lb 9.6 oz (84.2 kg)  06/02/24 183 lb (83 kg)  04/13/24 182 lb 12.8 oz (82.9 kg)    General: Well developed, well nourished  female in no apparent distress.  HEENT: AT/[place], no external lesions.  Eyes: Conjunctiva clear and no icterus. Neck: Neck supple  Lungs: Respirations not labored Neurologic: Alert, oriented, normal speech Extremities / Skin: Dry.    Diabetic Foot Exam - Simple   Simple Foot Form Diabetic Foot exam was performed with the following findings: Yes 07/15/2024  2:47 PM  Visual Inspection See comments: Yes Sensation Testing Intact to touch and monofilament testing bilaterally: Yes Pulse Check Comments Left great toe amputation, hammer toes deformity on left. No ulcer. DP palpable bilaterally.     LABS Reviewed Lab Results  Component Value Date   HGBA1C 11.6 (A) 06/02/2024   HGBA1C 10.5 (A) 02/12/2024   HGBA1C 8.3 (A) 08/28/2023   No results found for: FRUCTOSAMINE Lab Results  Component Value Date   CHOL 183 08/09/2021   HDL 57.50 08/09/2021   LDLCALC 105 (H) 08/09/2021   LDLDIRECT 96.0 07/04/2022   TRIG 104.0 08/09/2021   CHOLHDL 3 08/09/2021   Lab Results  Component Value Date   MICRALBCREAT 116 (H) 04/13/2024   MICRALBCREAT 165 (H) 05/06/2023    Lab Results  Component Value Date   CREATININE 1.43 (H) 03/03/2024   Lab Results  Component Value Date   GFR 51.61 (L) 07/04/2022    ASSESSMENT / PLAN  1. Uncontrolled type 1 diabetes mellitus with hyperglycemia (HCC)   2. Stage 3b chronic kidney disease (HCC)     Diabetes Mellitus type 1, complicated by CAD/nephropathy/neuropathy/retinopathy. - Diabetic status / severity: Uncontrolled.  Worsening.  Lab Results  Component Value Date   HGBA1C 11.6 (A) 06/02/2024    - Hemoglobin A1c goal : <6.5%  - Medications: See below.  Continue OmniPod 5 with Dexcom G7 using NovoLog  U100 : As long as she will have insulin  pump supplies.  Provided phone number to call OmniPod if there is any financial assistance program or installment payment available.  When not able to be on pump will switch to multidose insulin  regimen with insulin  regimen as follows.  Lantus  9 units two times a day.  Novolog  5 - 10 units with meals 3 times a day, plus sliding scale as follows. 5 units for small meal and up to 10 units for larger meal.  Sliding Scale Blood Glucose        Insulin  60-150                     None 151-200                   None 201-250                   1 units 251-300                   2 units 301-350                   3 units 351-400                   4 units      >400                        5 units and call provider    Patient may use insulin  vial due to cost.  Prescribed insulin  pen and insulin  vial.  She will compare cost with pharmacy.   - Home glucose testing: CGM Dexcom G7 and check as needed.  - Discussed/ Gave Hypoglycemia  treatment plan.  # Consult : Diabetes educator.    # Annual urine for microalbuminuria/ creatinine ratio, microalbuminuria currently, continue ACE/ARB /losartan .  She has CKD 3B, referred to nephrology.   Last  Lab Results  Component Value Date   MICRALBCREAT 116 (H) 04/13/2024    # Foot check nightly / neuropathy, continue pregabalin .   Discussed that improvement of diabetes may help with improving neuropathic symptoms.  On pregabalin  150 mg 3 times a day.  # Patient has diabetic retinopathy, advised to follow-up with ophthalmology regularly..   - Diet: Make healthy diabetic food choices - Life style / activity / exercise: Discussed.  2. Blood pressure  -  BP Readings from Last 1 Encounters:  07/15/24 134/66    - Control is in target.  - No change in current plans.  3. Lipid status / Hyperlipidemia - Last  Lab Results  Component Value Date   LDLCALC 105 (H) 08/09/2021   - Continue rosuvastatin  20 mg daily.  Managed by primary care provider /cardiology.  Diagnoses and all orders for this visit:  Uncontrolled type 1 diabetes mellitus with hyperglycemia (HCC) -     insulin  glargine (LANTUS  SOLOSTAR) 100 UNIT/ML Solostar Pen; Inject 9 Units into the skin 2 (two) times daily. -     insulin  glargine (LANTUS ) 100 UNIT/ML injection; Lantus  9 units  twice daily -     insulin  aspart (NOVOLOG  FLEXPEN) 100 UNIT/ML FlexPen; Take 5 - 10 units with meals 3 times a day, plus sliding scale, maximum 40 units/ day. -     Insulin  Pen Needle 32G X 4 MM MISC; Use 5x a day -     Ambulatory referral to Nephrology  Stage 3b chronic kidney disease (HCC) -     Ambulatory referral to Nephrology     DISPOSITION Follow up in clinic in 3 months suggested.   All questions answered and patient verbalized understanding of the plan.  Avonda Toso, MD [facility] Endocrinology [facility] [address] [place], [address] [place], [place] [address] Phone # [phone]  At least part of this note was generated using voice recognition software. Inadvertent word errors may have occurred, which were not recognized during the proofreading process. "

## 2024-07-20 ENCOUNTER — Telehealth: Payer: Self-pay

## 2024-07-20 ENCOUNTER — Other Ambulatory Visit (HOSPITAL_COMMUNITY): Payer: Self-pay

## 2024-07-20 NOTE — Telephone Encounter (Signed)
 Pharmacy Patient Advocate Encounter   Received notification from Pt Calls Messages that prior authorization for Omnipod is required/requested.   Insurance verification completed.   The patient is insured through [employer].   Per test claim: The current 30 day co-pay is, $452.04.  No PA needed at this time. This test claim was processed through [facility]- copay amounts may vary at other pharmacies due to pharmacy/plan contracts, or as the patient moves through the different stages of their insurance plan.     Pt has a high remaining deductible of $450

## 2024-07-22 NOTE — Telephone Encounter (Signed)
 Patient attempted to give status from PA team, however unable to leave VM as it is full. Patient does not have mychart. Will retry at a later date.

## 2024-07-23 ENCOUNTER — Other Ambulatory Visit: Payer: Self-pay

## 2024-07-23 NOTE — Telephone Encounter (Signed)
 Patient called and made aware of PA status. Per patient she spoke with Medicare yesterday and has been set up on a payment plan to assist getting her Omnipod and insulin . No further questions at this time.

## 2024-08-03 ENCOUNTER — Ambulatory Visit: Admitting: Family Medicine

## 2024-08-11 ENCOUNTER — Ambulatory Visit: Admitting: Family Medicine

## 2024-10-22 ENCOUNTER — Ambulatory Visit: Admitting: Endocrinology

## 2025-01-12 ENCOUNTER — Ambulatory Visit
